# Patient Record
Sex: Female | Born: 1950 | Race: Black or African American | Hispanic: No | Smoking: Former smoker
Health system: Southern US, Community
[De-identification: ages and names within clinical notes are randomized; demographics above are authoritative.]

## PROBLEM LIST (undated history)

## (undated) ENCOUNTER — Emergency Department (HOSPITAL_COMMUNITY): Payer: Medicare Other | Source: Home / Self Care

## (undated) DIAGNOSIS — K529 Noninfective gastroenteritis and colitis, unspecified: Secondary | ICD-10-CM

## (undated) DIAGNOSIS — K219 Gastro-esophageal reflux disease without esophagitis: Secondary | ICD-10-CM

## (undated) DIAGNOSIS — I10 Essential (primary) hypertension: Secondary | ICD-10-CM

## (undated) DIAGNOSIS — I629 Nontraumatic intracranial hemorrhage, unspecified: Secondary | ICD-10-CM

## (undated) DIAGNOSIS — I4892 Unspecified atrial flutter: Secondary | ICD-10-CM

## (undated) DIAGNOSIS — M6281 Muscle weakness (generalized): Secondary | ICD-10-CM

## (undated) DIAGNOSIS — S40021A Contusion of right upper arm, initial encounter: Secondary | ICD-10-CM

## (undated) DIAGNOSIS — N186 End stage renal disease: Secondary | ICD-10-CM

## (undated) DIAGNOSIS — F329 Major depressive disorder, single episode, unspecified: Secondary | ICD-10-CM

## (undated) DIAGNOSIS — F419 Anxiety disorder, unspecified: Secondary | ICD-10-CM

## (undated) DIAGNOSIS — D649 Anemia, unspecified: Secondary | ICD-10-CM

## (undated) DIAGNOSIS — G8929 Other chronic pain: Secondary | ICD-10-CM

## (undated) DIAGNOSIS — G459 Transient cerebral ischemic attack, unspecified: Secondary | ICD-10-CM

## (undated) DIAGNOSIS — R000 Tachycardia, unspecified: Secondary | ICD-10-CM

## (undated) DIAGNOSIS — F32A Depression, unspecified: Secondary | ICD-10-CM

## (undated) DIAGNOSIS — I272 Pulmonary hypertension, unspecified: Secondary | ICD-10-CM

## (undated) DIAGNOSIS — G4700 Insomnia, unspecified: Secondary | ICD-10-CM

## (undated) DIAGNOSIS — Z21 Asymptomatic human immunodeficiency virus [HIV] infection status: Secondary | ICD-10-CM

## (undated) DIAGNOSIS — Z992 Dependence on renal dialysis: Secondary | ICD-10-CM

## (undated) DIAGNOSIS — B20 Human immunodeficiency virus [HIV] disease: Secondary | ICD-10-CM

## (undated) DIAGNOSIS — A0472 Enterocolitis due to Clostridium difficile, not specified as recurrent: Secondary | ICD-10-CM

## (undated) HISTORY — DX: Transient cerebral ischemic attack, unspecified: G45.9

## (undated) HISTORY — PX: ABDOMINAL HYSTERECTOMY: SHX81

## (undated) HISTORY — DX: Tachycardia, unspecified: R00.0

## (undated) HISTORY — DX: Gastro-esophageal reflux disease without esophagitis: K21.9

## (undated) HISTORY — PX: OTHER SURGICAL HISTORY: SHX169

## (undated) HISTORY — PX: TONSILLECTOMY: SUR1361

## (undated) HISTORY — PX: BIOPSY THYROID: PRO38

---

## 1998-10-25 ENCOUNTER — Ambulatory Visit (HOSPITAL_COMMUNITY): Admission: RE | Admit: 1998-10-25 | Discharge: 1998-10-25 | Payer: Self-pay | Admitting: Internal Medicine

## 1999-12-04 ENCOUNTER — Encounter: Payer: Self-pay | Admitting: Emergency Medicine

## 1999-12-04 ENCOUNTER — Emergency Department (HOSPITAL_COMMUNITY): Admission: EM | Admit: 1999-12-04 | Discharge: 1999-12-04 | Payer: Self-pay | Admitting: Emergency Medicine

## 2001-08-16 ENCOUNTER — Other Ambulatory Visit: Admission: RE | Admit: 2001-08-16 | Discharge: 2001-08-16 | Payer: Self-pay | Admitting: Obstetrics

## 2001-09-20 ENCOUNTER — Ambulatory Visit (HOSPITAL_COMMUNITY): Admission: RE | Admit: 2001-09-20 | Discharge: 2001-09-20 | Payer: Self-pay | Admitting: Obstetrics

## 2001-09-20 ENCOUNTER — Encounter: Payer: Self-pay | Admitting: Obstetrics

## 2001-09-22 ENCOUNTER — Encounter (INDEPENDENT_AMBULATORY_CARE_PROVIDER_SITE_OTHER): Payer: Self-pay | Admitting: *Deleted

## 2001-09-22 ENCOUNTER — Encounter: Payer: Self-pay | Admitting: Obstetrics

## 2001-09-22 ENCOUNTER — Encounter: Admission: RE | Admit: 2001-09-22 | Discharge: 2001-09-22 | Payer: Self-pay | Admitting: Obstetrics

## 2002-05-26 ENCOUNTER — Emergency Department (HOSPITAL_COMMUNITY): Admission: EM | Admit: 2002-05-26 | Discharge: 2002-05-27 | Payer: Self-pay | Admitting: *Deleted

## 2002-05-27 ENCOUNTER — Encounter: Payer: Self-pay | Admitting: *Deleted

## 2002-08-24 ENCOUNTER — Encounter: Payer: Self-pay | Admitting: General Surgery

## 2002-08-24 ENCOUNTER — Ambulatory Visit (HOSPITAL_COMMUNITY): Admission: RE | Admit: 2002-08-24 | Discharge: 2002-08-24 | Payer: Self-pay | Admitting: General Surgery

## 2002-08-24 IMAGING — CT CT NECK W/ CM
2 of 4 series · 5 of 14 positions shown, 6 images · IV contrast (agent unspecified)
Comparison: none

FINDINGS
CLINICAL DATA: DIFFUSE LYMPHADENOPATHY ON PHYSICAL EXAM.
CT NECK WITH CONTRAST
CONTIGUOUS AXIAL CT IMAGES WERE OBTAINED FROM THE SKULL BASE THROUGH THE PUBIC SYMPHYSIS AFTER
SPIRAL CT SCANNING.  ORAL AND 150 CC OF NONIONIC INTRAVENOUS CONTRAST WAS ADMINISTERED PRIOR TO
SCANNING.
THERE IS BILATERAL SUBMANDIBULAR AND CERVICAL LYMPHADENOPATHY WITH 2.2 X 1.1 CM INDEX LEFT
SUBMANDIBULAR NODE.  A 2.0 X 1.0 CM LEFT JUGULAR DIGASTRIC NODE IS ASSOCIATED WITH A 2.2 X 1.4 CM
POSTERIOR TRIANGLE NODE OF THE LEFT LOWER NECK.  THERE ARE SHOTTY SUPRACLAVICULAR LYMPH NODES
BILATERALLY.
DEGENERATIVE CHANGES CHARACTERIZE THE UPPER CERVICAL SPINE WITHOUT EVIDENCE FOR FOCAL SCLEROTIC OR
LYTIC OSSEOUS LESIONS.
IMPRESSION
BILATERAL SUBMANDIBULAR AND CERVICAL LYMPHADENOPATHY WITH ABNORMAL LYMPH NODES IDENTIFIED IN BOTH
SUPRACLAVICULAR REGIONS.
CT CHEST WITH CONTRAST
BILATERAL AXILLARY LYMPHADENOPATHY IS APPARENT.  THERE IS A PROMINENT RIGHT AXILLARY NODE WHICH
MEASURES 3.7 X APPROXIMATELY 2.0 CM ALTHOUGH THE ENTIRE NODE HAS NOT BEEN INCLUDED WITHIN THE FIELD
OF VIEW.  SHOTTY LYMPH NODES ARE SEEN IN THE SUPERIOR MEDIASTINUM AND THERE IS A 12 MM SHORT AXIS
LYMPH NODE IDENTIFIED IN THE AP WINDOW.  10 MM SHORT AXIS SUBCARINAL NODE IS ASSOCIATED WITH
LYMPHOID TISSUE IN BOTH HILAR REGIONS BUT NO FRANK HILAR LYMPHADENOPATHY.  THE HEART IS AT UPPER
LIMITS OF NORMAL FOR SIZE WITHOUT EVIDENCE OF PERICARDIAL EFFUSION.  THERE IS NO EVIDENCE FOR
PLEURAL FLUID COLLECTION.
A 1 CM RIGHT LATERAL THORACIC NODE IS IDENTIFIED IN THE ANTERIOR AXILLARY LINE.
LUNG WINDOWS REVEAL NO AREAS OF FOCAL CONSOLIDATION.  NO PARENCHYMAL NODULES OR MASSES ARE
IDENTIFIED.
SHOTTY LYMPH NODES ARE IDENTIFIED IN THE SUPERIOR MEDIASTINUM WITH BORDERLINE ENLARGED AP WINDOW
AND SUBCARINAL ADENOPATHY.  PROMINENT AXILLARY NODES ARE SEEN BILATERALLY.
CT ABDOMEN WITH CONTRAST
THE LIVER, SPLEEN, STOMACH, DUODENUM, PANCREAS, ADRENAL GLANDS, AND LEFT KIDNEY HAVE NORMAL IMAGING
FEATURES.  THERE ARE SEVERAL TINY LOW DENSITY LESIONS IN THE RIGHT KIDNEY, WHICH ARE TOO SMALL TO
CHARACTERIZE BUT PROBABLY REPRESENT CYSTS.  PROMINENT MUCOSAL ENHANCEMENT  IS NOTED IN THE
GALLBLADDER WITH QUESTION OF MILD GALLBLADDER WALL THICKENING.
NO LYMPHADENOPATHY IS IDENTIFIED IN THE ABDOMEN.
1.  NO EVIDENCE FOR ABDOMINAL LYMPHADENOPATHY.
2.  QUERY MILD GALLBLADDER WALL THICKENING.
3.  SMALL LOW DENSITY RIGHT RENAL LESIONS ARE TOO SMALL TO CHARACTERIZE.
CT PELVIS W/CONTRAST
SCANNING THROUGH THE ANATOMIC PELVIS REVEALS A 1.9 X 1.1 CM LEFT COMMON ILIAC LYMPH NODE (IMAGE
#46).  LESS PROMINENT RIGHT COMMON ILIAC ADENOPATHY IS APPARENT WITH SMALL LYMPH NODES SEEN ALONG
THE RIGHT INTERNAL ILIAC CHAIN AND BILATERAL COMMON FEMORAL AND INGUINAL LYMPHADENOPATHY.
NO FREE FLUID IS IDENTIFIED IN THE ANATOMIC PELVIS.  THE PATIENT IS STATUS POST HYSTERECTOMY.  NO
EVIDENCE FOR ADNEXAL MASSES.
BILATERAL PELVIC SIDE WALL LYMPHADENOPATHY.  GIVEN THE WIDE DISTRIBUTION OF INVOLVED NODES
INVOLVING THE NECK, UPPER THORAX, MEDIASTINUM, AND ANATOMIC PELVIS, DIFFUSE INFECTIOUS/INFLAMMATORY
PROCESS OR NEOPLASM ARE THE MOST LIKELY CONSIDERATIONS.  FEATURES WOULD BE COMPATIBLE WITH LYMPHOMA.

[Series 5499: — · axial · 0.54mm/px · z∈[-941,-861]mm · 2 of 50 slices shown (1 of 2)]
[im 17/50  bone]
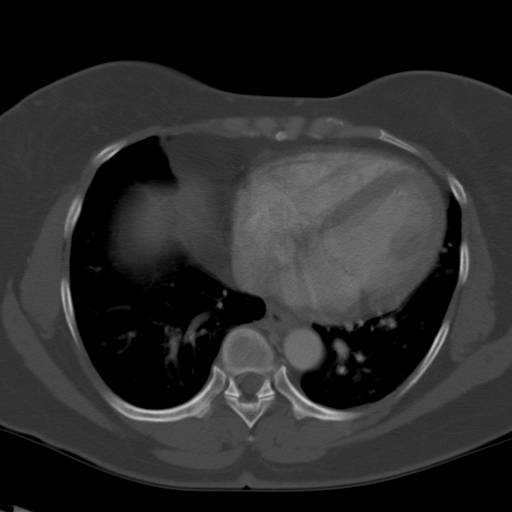
[im 33/50  bone]
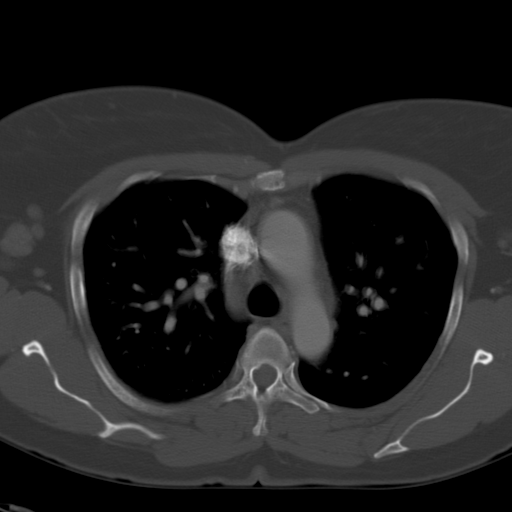

[Series 5500: — · axial · 0.63mm/px · z∈[-1248,-1038]mm · 3 of 84 slices shown, 4 images (2 of 2)]
[im 21/84  soft-tissue]
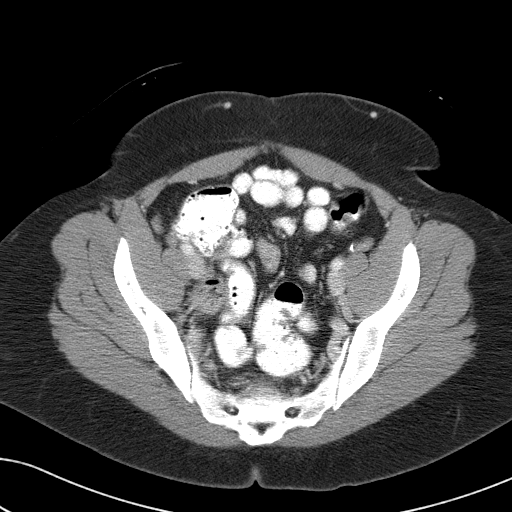
[im 21/84  bone]
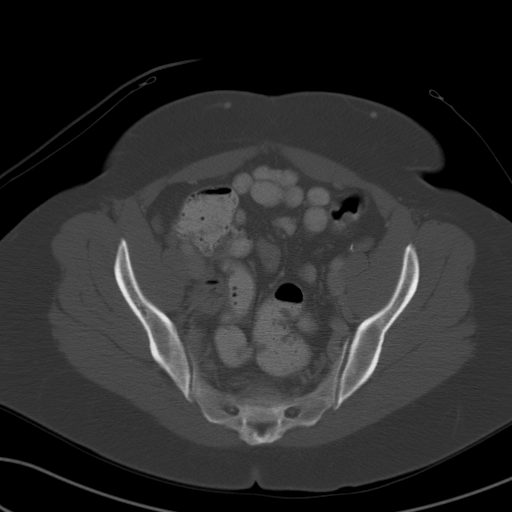
[im 42/84  bone]
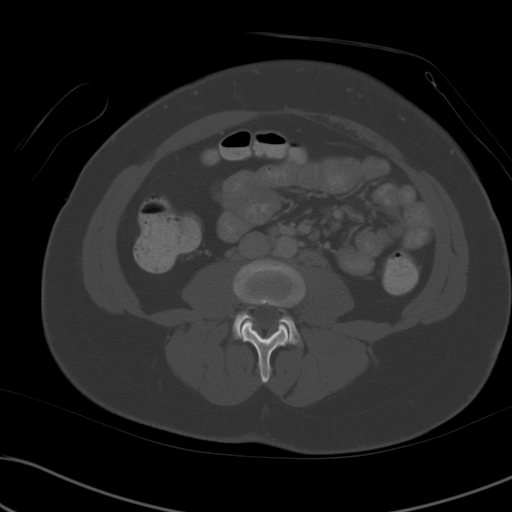
[im 63/84  bone]
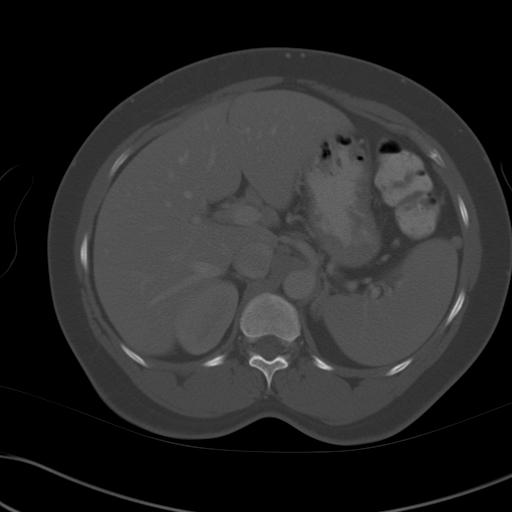

[5 of 14 positions shown; findings below may reference images not displayed]

## 2002-09-20 ENCOUNTER — Inpatient Hospital Stay (HOSPITAL_COMMUNITY): Admission: RE | Admit: 2002-09-20 | Discharge: 2002-09-29 | Payer: Self-pay | Admitting: General Surgery

## 2002-09-20 ENCOUNTER — Encounter: Payer: Self-pay | Admitting: Family Medicine

## 2002-09-28 ENCOUNTER — Encounter: Payer: Self-pay | Admitting: Family Medicine

## 2002-10-25 ENCOUNTER — Encounter: Admission: RE | Admit: 2002-10-25 | Discharge: 2002-10-25 | Payer: Self-pay | Admitting: Oncology

## 2002-10-25 ENCOUNTER — Encounter (HOSPITAL_COMMUNITY): Admission: RE | Admit: 2002-10-25 | Discharge: 2002-11-24 | Payer: Self-pay | Admitting: Oncology

## 2002-11-27 ENCOUNTER — Encounter (INDEPENDENT_AMBULATORY_CARE_PROVIDER_SITE_OTHER): Payer: Self-pay | Admitting: *Deleted

## 2002-11-27 ENCOUNTER — Encounter: Payer: Self-pay | Admitting: Internal Medicine

## 2002-11-27 ENCOUNTER — Ambulatory Visit (HOSPITAL_COMMUNITY): Admission: RE | Admit: 2002-11-27 | Discharge: 2002-11-27 | Payer: Self-pay | Admitting: Internal Medicine

## 2002-11-27 ENCOUNTER — Encounter: Admission: RE | Admit: 2002-11-27 | Discharge: 2002-11-27 | Payer: Self-pay | Admitting: Internal Medicine

## 2003-01-16 ENCOUNTER — Encounter: Admission: RE | Admit: 2003-01-16 | Discharge: 2003-01-16 | Payer: Self-pay | Admitting: Internal Medicine

## 2003-03-20 ENCOUNTER — Encounter: Admission: RE | Admit: 2003-03-20 | Discharge: 2003-03-20 | Payer: Self-pay | Admitting: Internal Medicine

## 2003-05-18 ENCOUNTER — Ambulatory Visit (HOSPITAL_COMMUNITY): Admission: RE | Admit: 2003-05-18 | Discharge: 2003-05-18 | Payer: Self-pay | Admitting: Family Medicine

## 2003-05-18 IMAGING — CR DG CHEST 2V
2 series · 2 of 2 positions shown · non-contrast
Comparison: One view chest [DATE].

CLINICAL DATA: Cough, hemoptysis.
 CHEST TWO VIEWS  [DATE] at [UZ] HOURS

[view not recorded (1 of 2)]
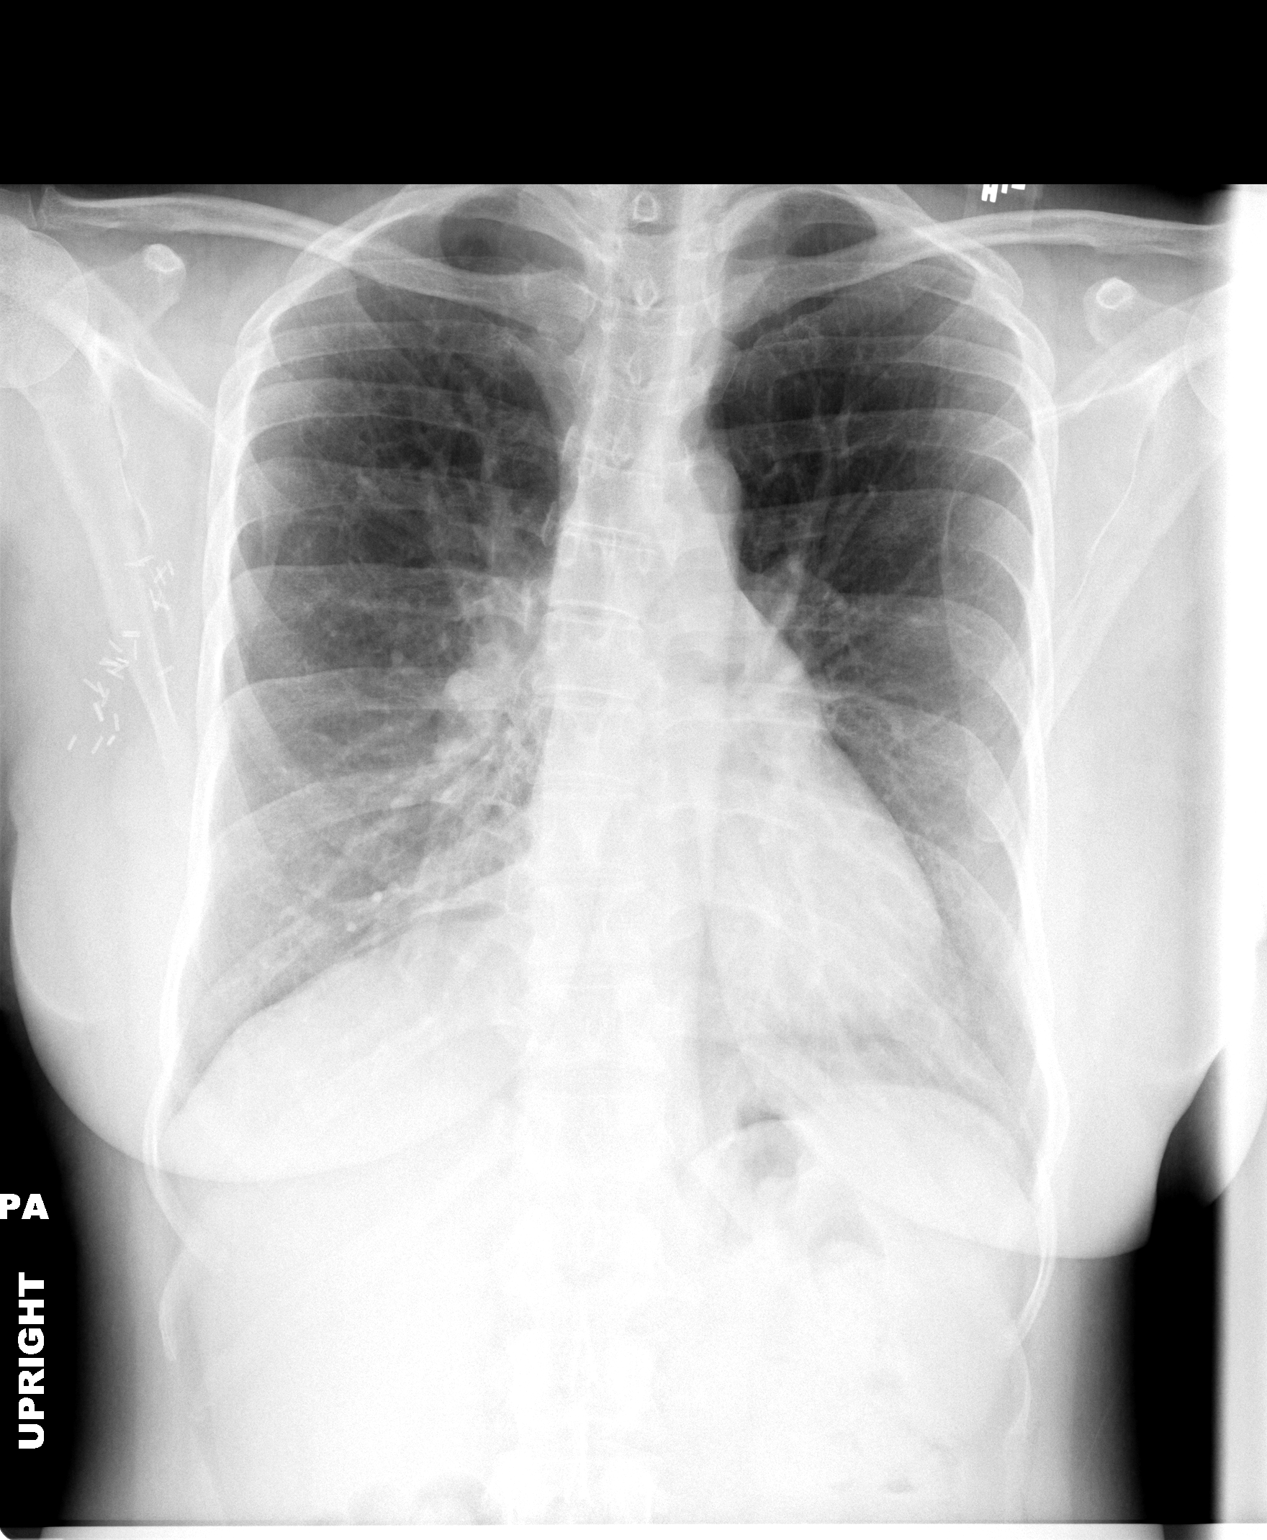

[view not recorded (2 of 2)]
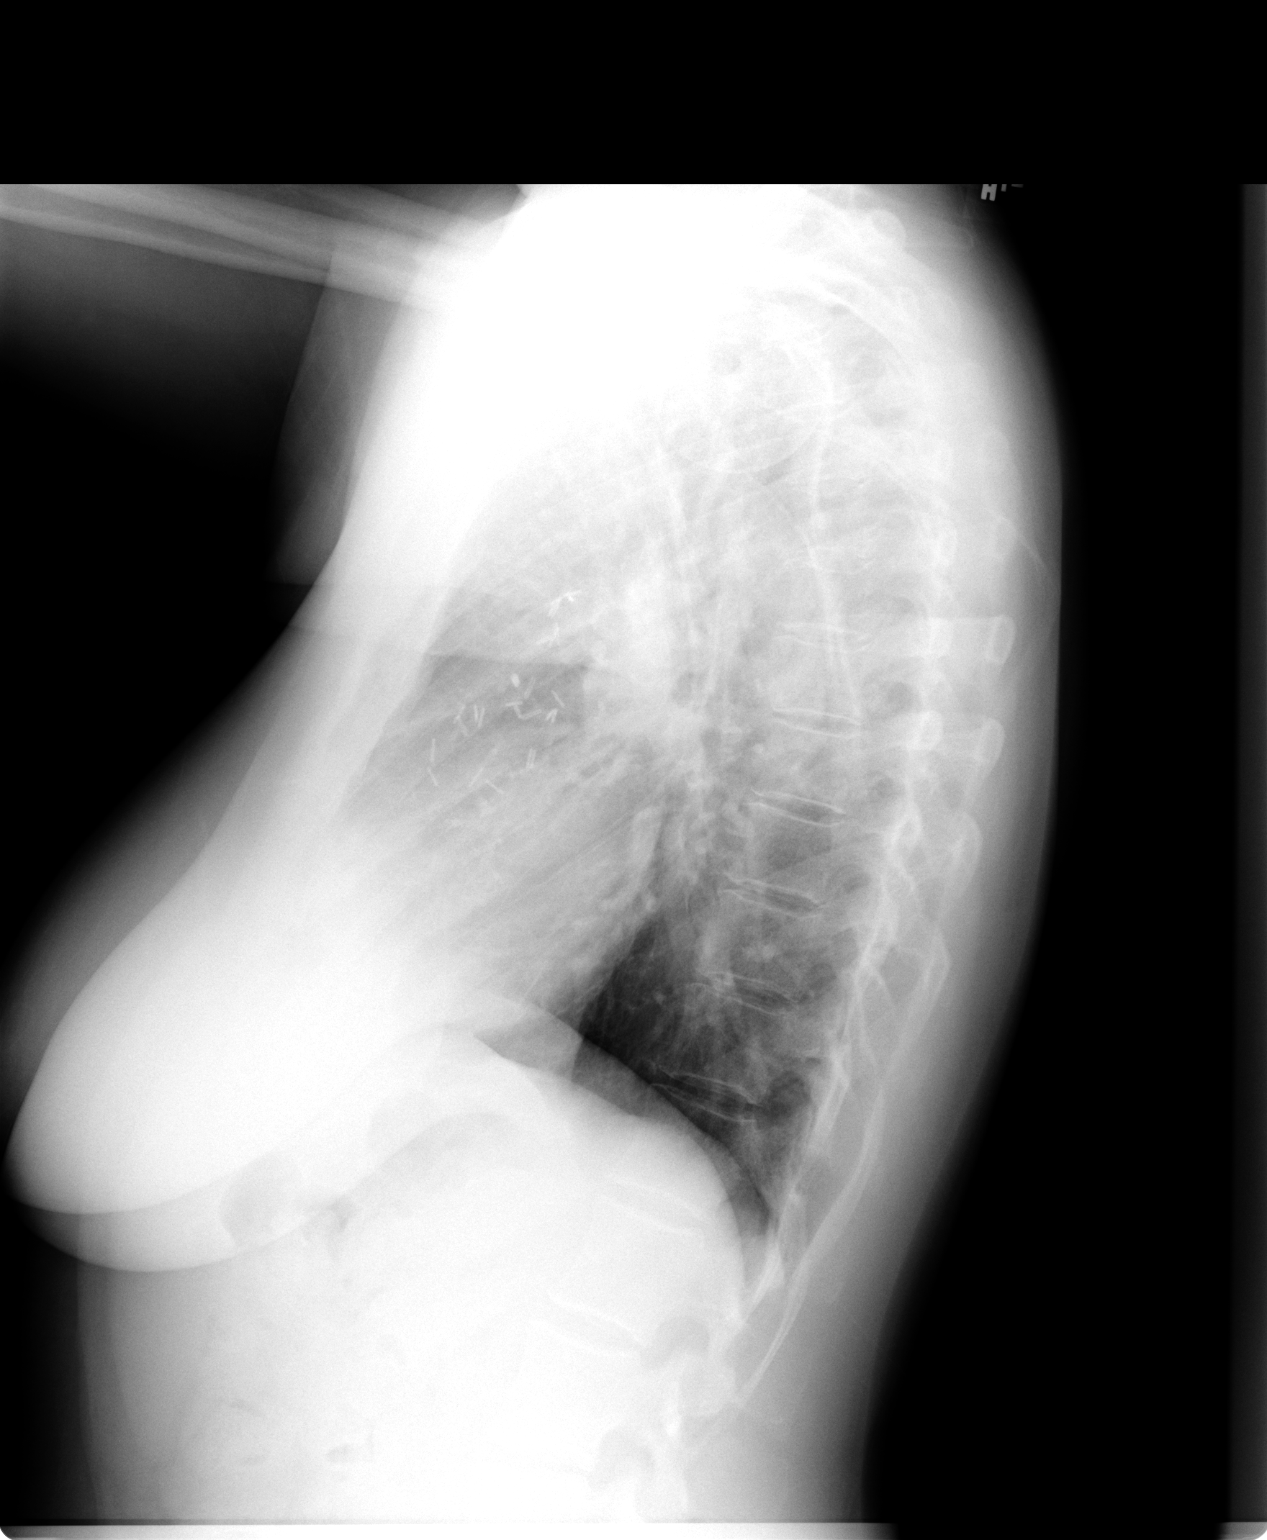

[2 of 2 positions shown; findings below may reference images not displayed]

Heart size is normal and stable.  Minimal thoracic aortic atherosclerosis is unchanged.  The hilar and mediastinal contours are otherwise unremarkable.  The lungs appear clear.  Marked central peribronchial thickening is present.  Note is again made of surgical clips in the right axilla from prior node dissection.  Mild degenerative changes are present in the thoracic spine.  
 IMPRESSION
 Moderate to severe changes of asthma/bronchitis without localized consolidation.

## 2003-05-28 ENCOUNTER — Inpatient Hospital Stay (HOSPITAL_COMMUNITY): Admission: AD | Admit: 2003-05-28 | Discharge: 2003-06-04 | Payer: Self-pay | Admitting: General Surgery

## 2003-05-28 IMAGING — US US ABDOMEN COMPLETE
1 series · 14 of 25 positions shown · non-contrast
Comparison: none

CLINICAL DATA: Acute abdominal pain; pancreatitis; HIV positive
 ABDOMINAL ULTRASOUND
 Multiple real time sector scans are performed.  
 The liver has a normal appearance without evidence of focal lesions or biliary ductal dilatation.  The gallbladder contains sludge.  There are no identifiable stones.  No gallbladder wall thickening.  The common duct measures 4 mm proximally and 6 mm distally.  I do not see any stone in the common duct.  The spleen appears normal.  The pancreas appears normal.  The aorta and inferior vena cava appear normal.  There is no aneurysm.  Both kidneys are echogenic, the right measuring 11.0 cm in length and the left also measuring 11.0 cm in length.  There is no hydronephrosis, cyst, or mass.  There is no ascites. 
 IMPRESSION
 1.  Gallbladder sludge without evidence of cholecystitis or obstruction. 
 2.  Echogenic kidneys without evidence of focal lesion or obstruction.

[Series 1: unknown · 0.33mm/px · 14 of 64 slices shown]
[im 1/64]
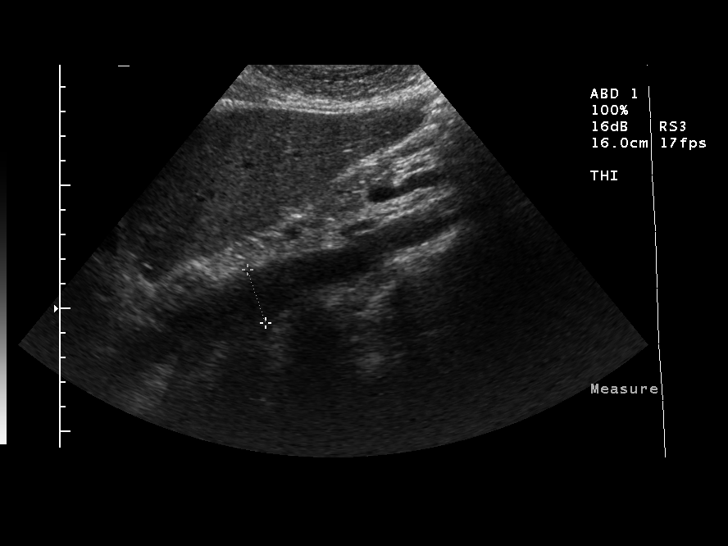
[im 6/64]
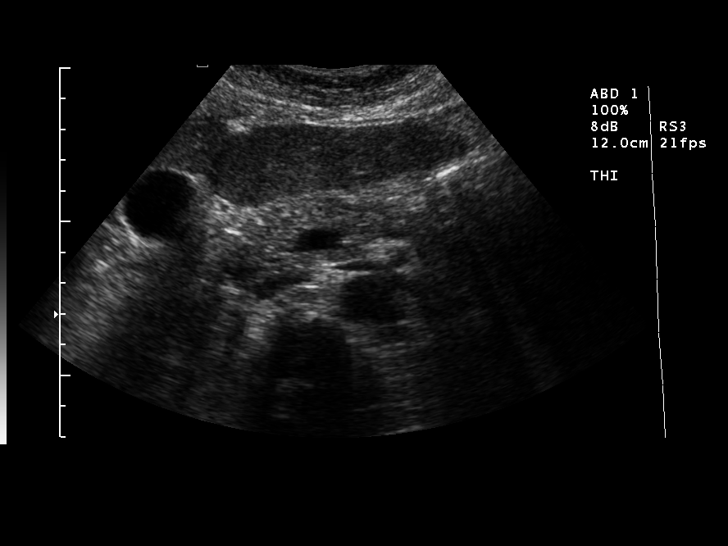
[im 11/64]
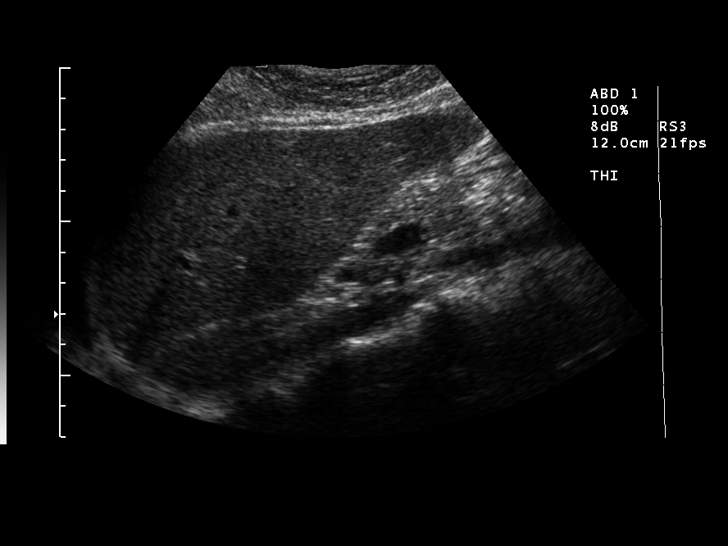
[im 16/64]
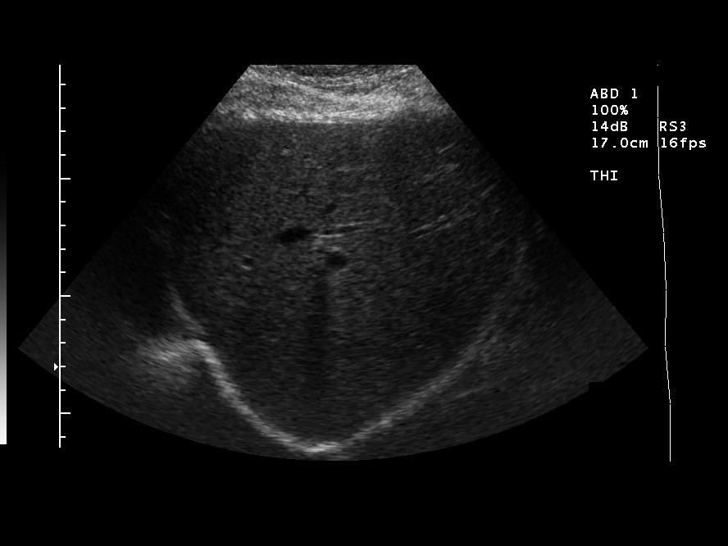
[im 22/64]
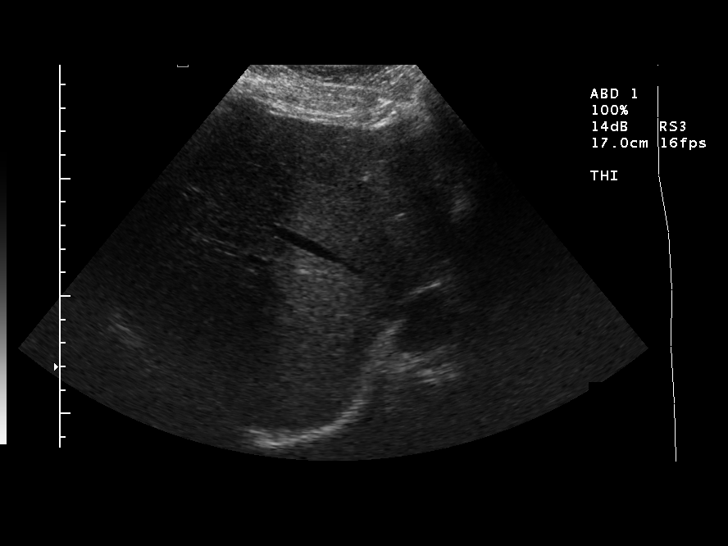
[im 24/64]
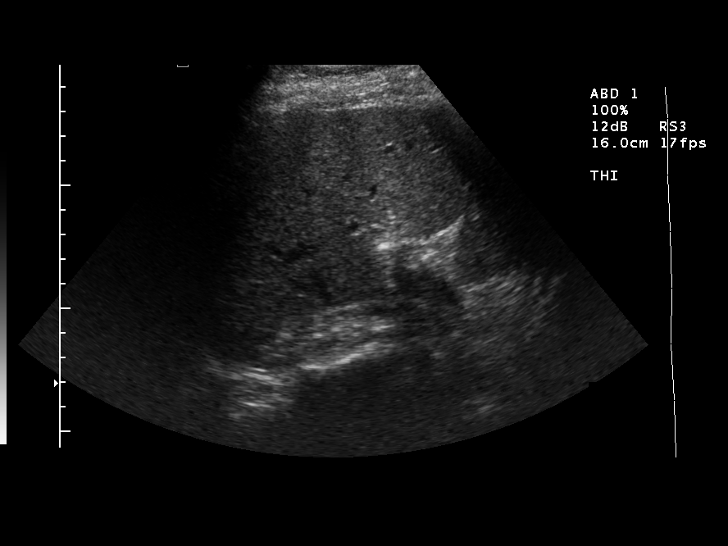
[im 29/64]
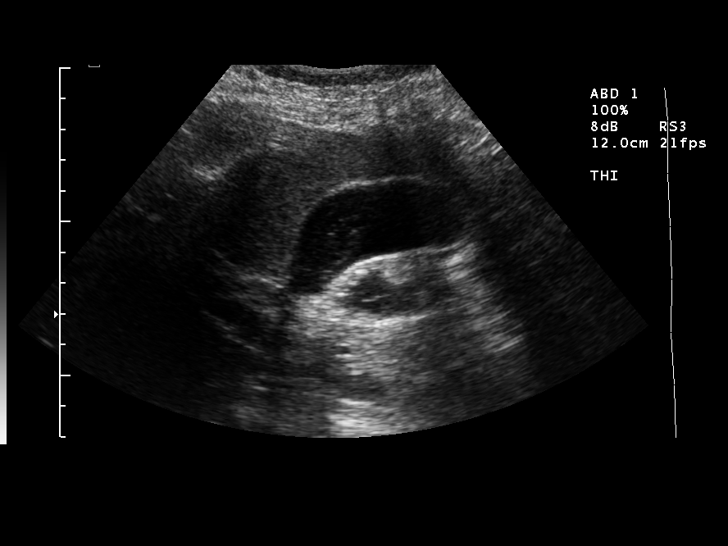
[im 35/64]
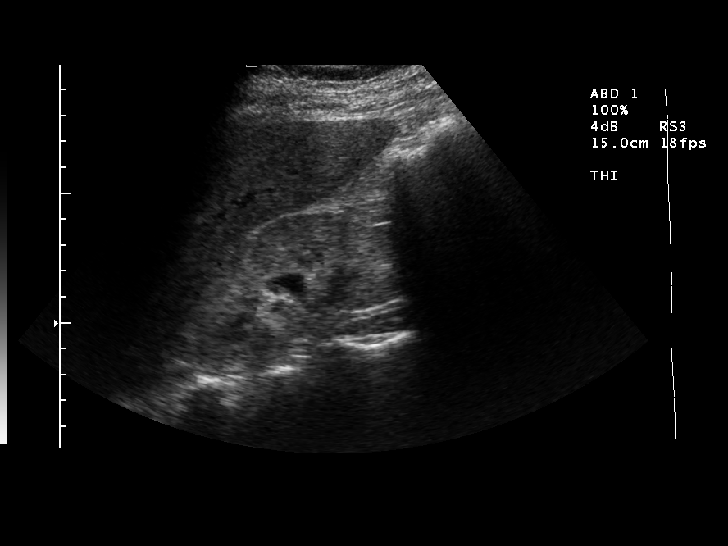
[im 40/64]
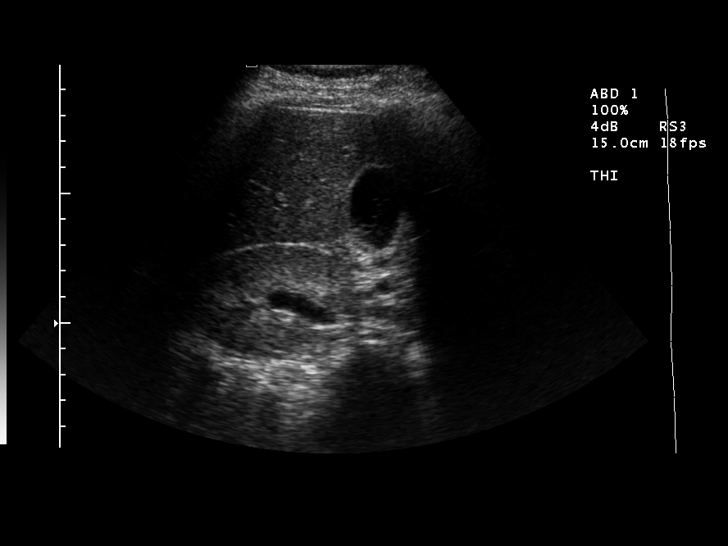
[im 43/64]
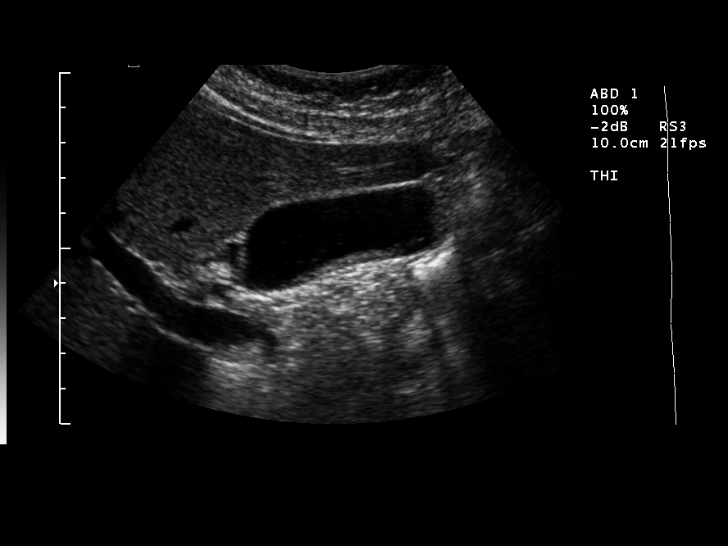
[im 48/64]
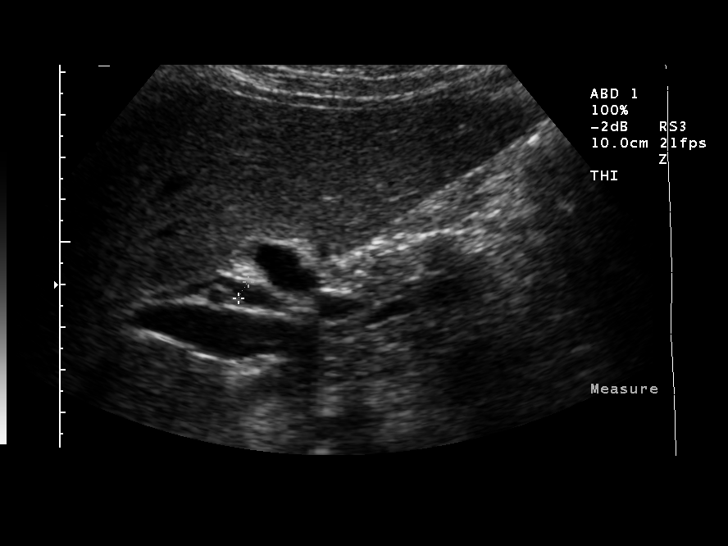
[im 53/64]
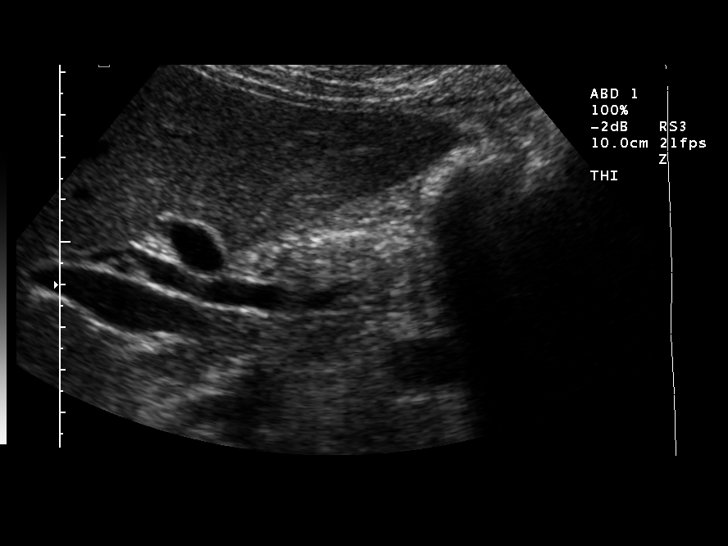
[im 58/64]
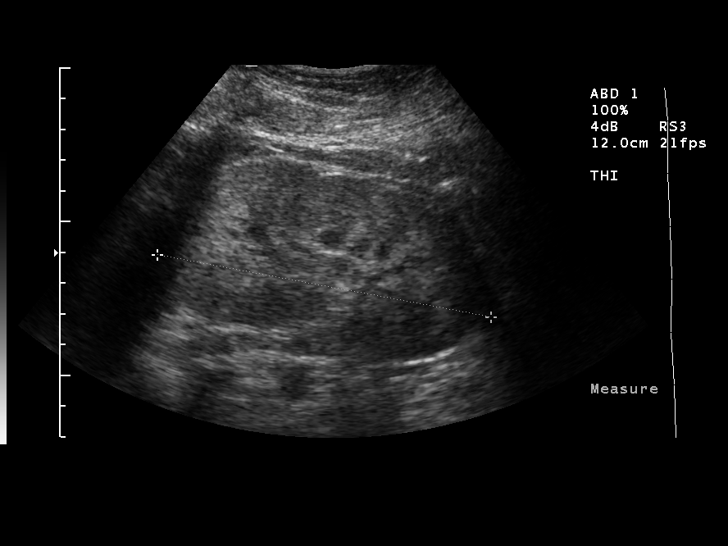
[im 64/64]
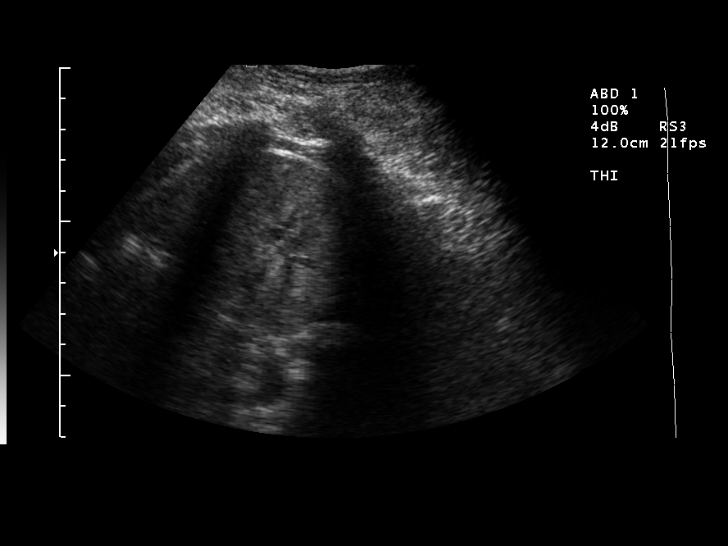

[14 of 25 positions shown; findings below may reference images not displayed]

## 2003-06-19 ENCOUNTER — Encounter: Admission: RE | Admit: 2003-06-19 | Discharge: 2003-06-19 | Payer: Self-pay | Admitting: Internal Medicine

## 2003-08-21 ENCOUNTER — Encounter: Admission: RE | Admit: 2003-08-21 | Discharge: 2003-08-21 | Payer: Self-pay | Admitting: Internal Medicine

## 2003-08-21 ENCOUNTER — Ambulatory Visit (HOSPITAL_COMMUNITY): Admission: RE | Admit: 2003-08-21 | Discharge: 2003-08-21 | Payer: Self-pay | Admitting: Internal Medicine

## 2003-09-04 ENCOUNTER — Encounter: Admission: RE | Admit: 2003-09-04 | Discharge: 2003-09-04 | Payer: Self-pay | Admitting: Internal Medicine

## 2004-01-03 ENCOUNTER — Emergency Department (HOSPITAL_COMMUNITY): Admission: EM | Admit: 2004-01-03 | Discharge: 2004-01-03 | Payer: Self-pay | Admitting: Emergency Medicine

## 2004-04-01 ENCOUNTER — Ambulatory Visit: Payer: Self-pay | Admitting: Internal Medicine

## 2004-04-01 ENCOUNTER — Ambulatory Visit (HOSPITAL_COMMUNITY): Admission: RE | Admit: 2004-04-01 | Discharge: 2004-04-01 | Payer: Self-pay | Admitting: Internal Medicine

## 2004-04-01 ENCOUNTER — Encounter (INDEPENDENT_AMBULATORY_CARE_PROVIDER_SITE_OTHER): Payer: Self-pay | Admitting: *Deleted

## 2004-06-26 ENCOUNTER — Emergency Department (HOSPITAL_COMMUNITY): Admission: EM | Admit: 2004-06-26 | Discharge: 2004-06-26 | Payer: Self-pay | Admitting: Emergency Medicine

## 2004-07-21 ENCOUNTER — Inpatient Hospital Stay (HOSPITAL_COMMUNITY): Admission: EM | Admit: 2004-07-21 | Discharge: 2004-07-29 | Payer: Self-pay | Admitting: Emergency Medicine

## 2004-07-22 IMAGING — US US ABDOMEN COMPLETE
1 series · 14 of 25 positions shown · non-contrast
Comparison: none

HISTORY: Acute renal failure, pancreatitis

[Series 1: unknown · 0.34mm/px · 14 of 103 slices shown]
[im 1/103]
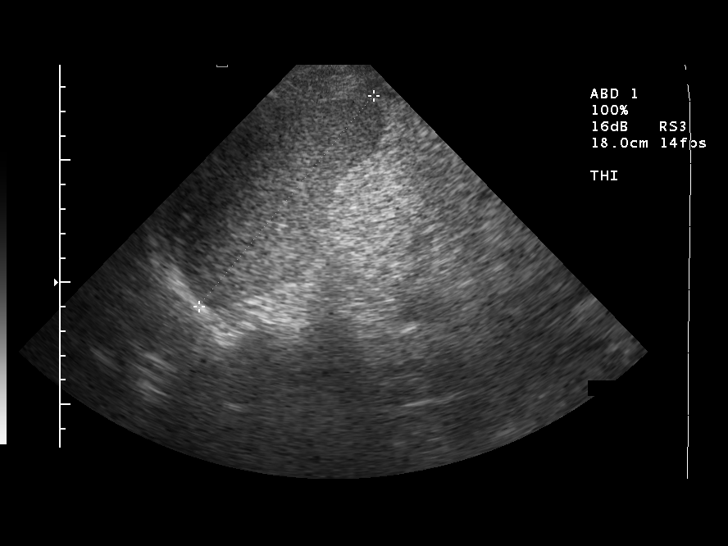
[im 9/103]
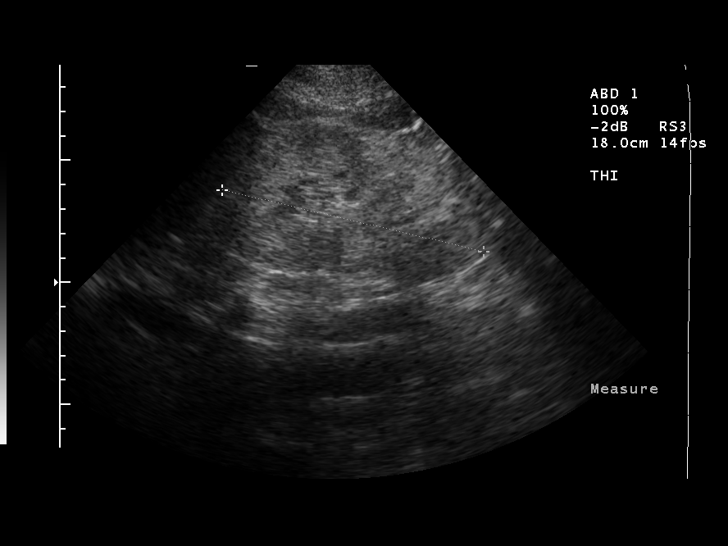
[im 18/103]
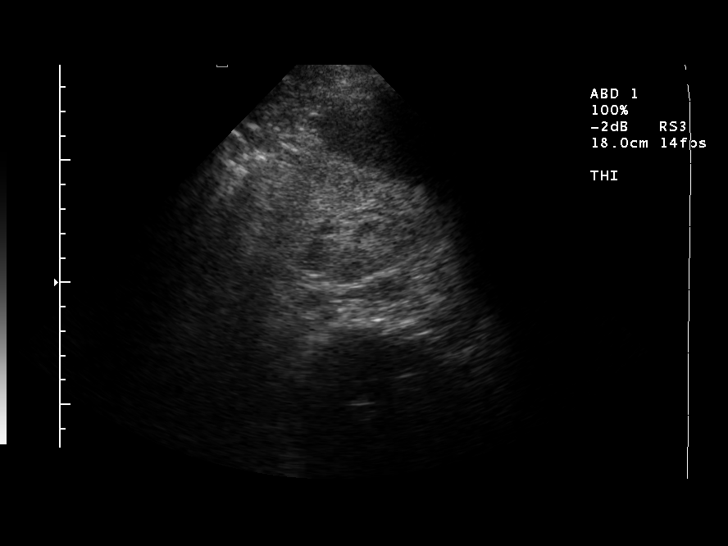
[im 26/103]
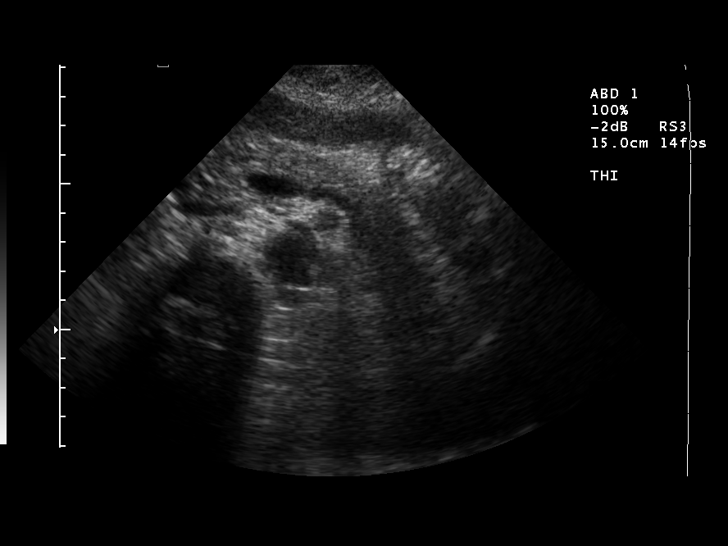
[im 35/103]
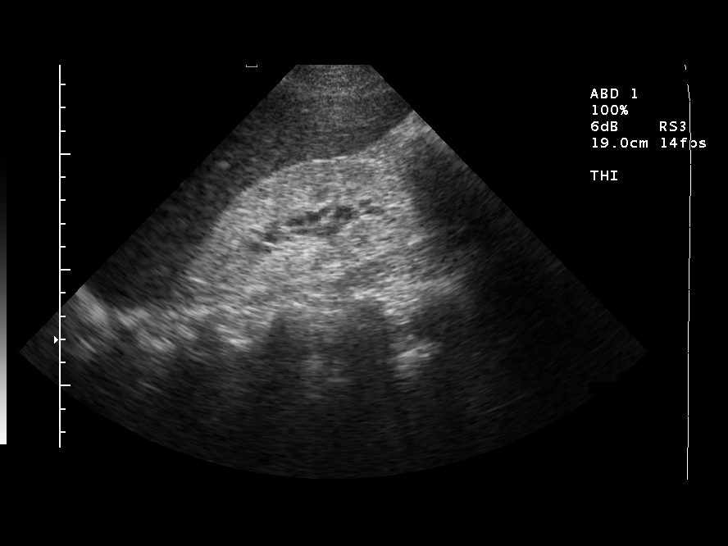
[im 39/103]
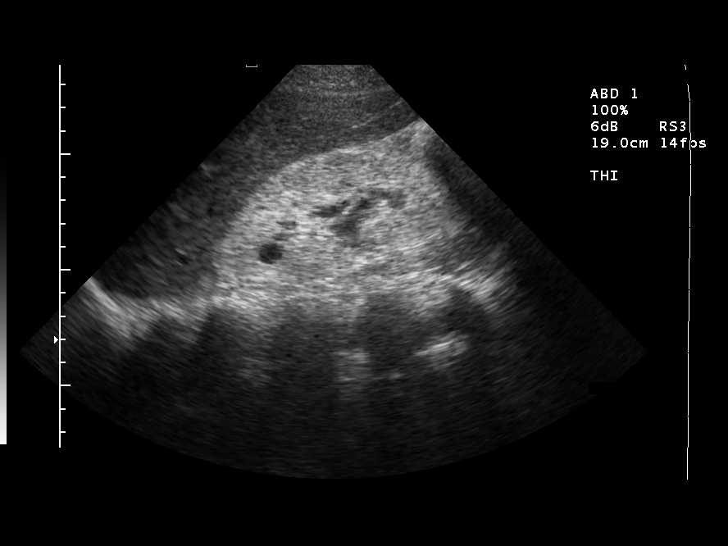
[im 47/103]
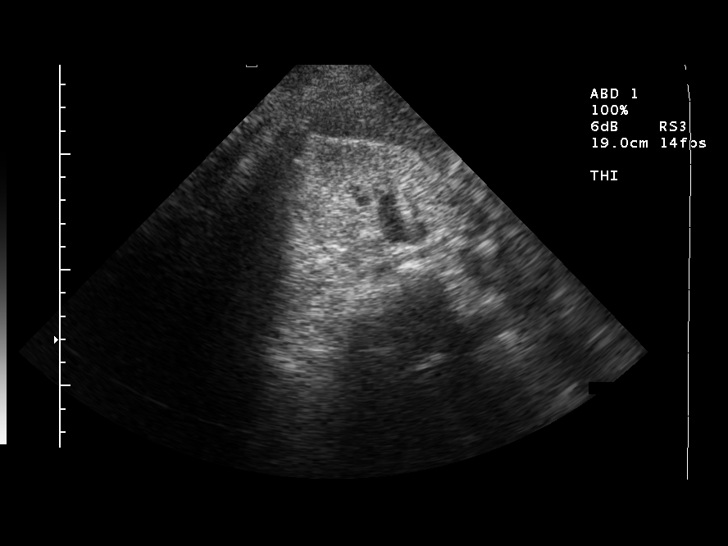
[im 56/103]
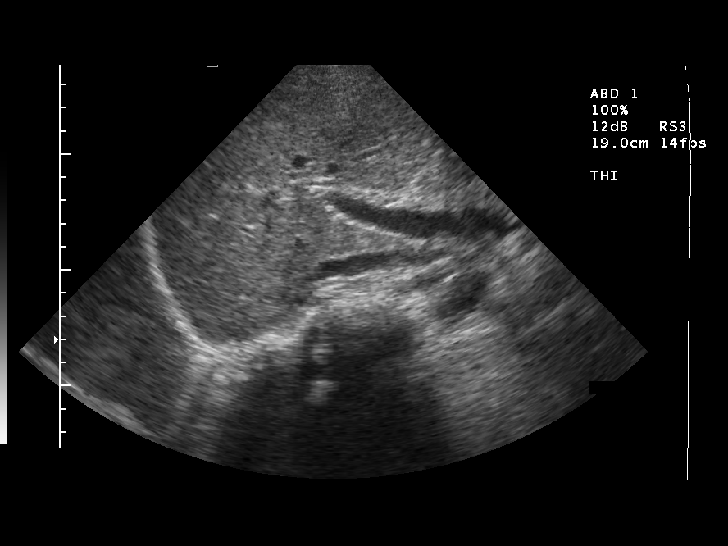
[im 64/103]
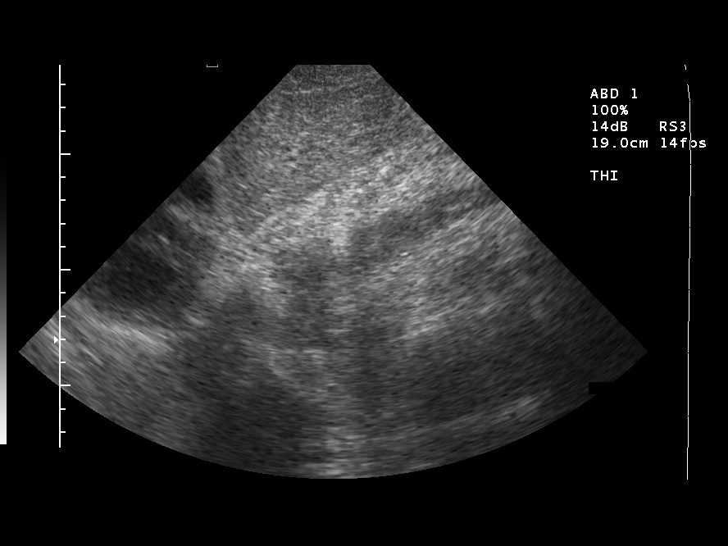
[im 69/103]
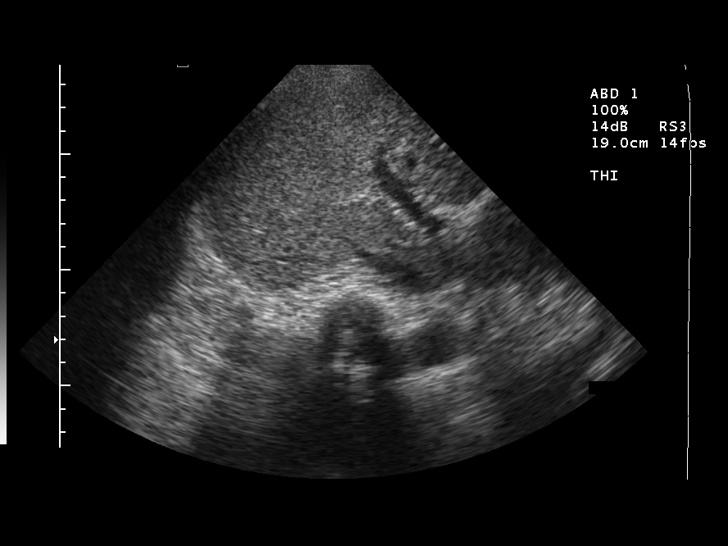
[im 77/103]
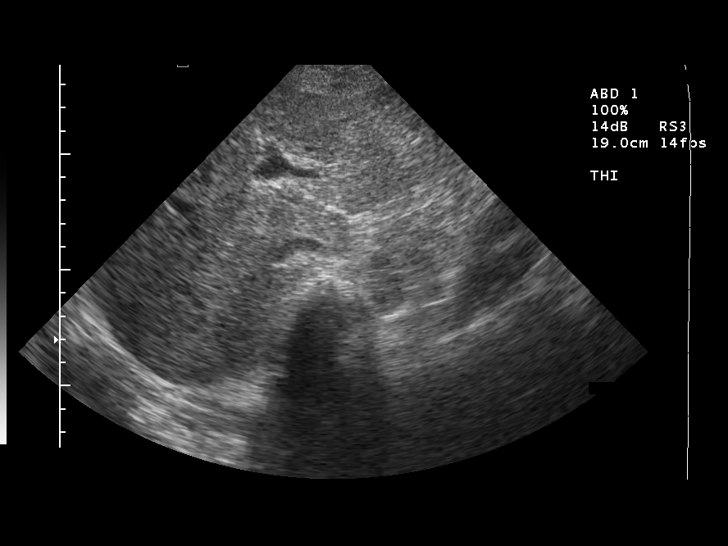
[im 86/103]
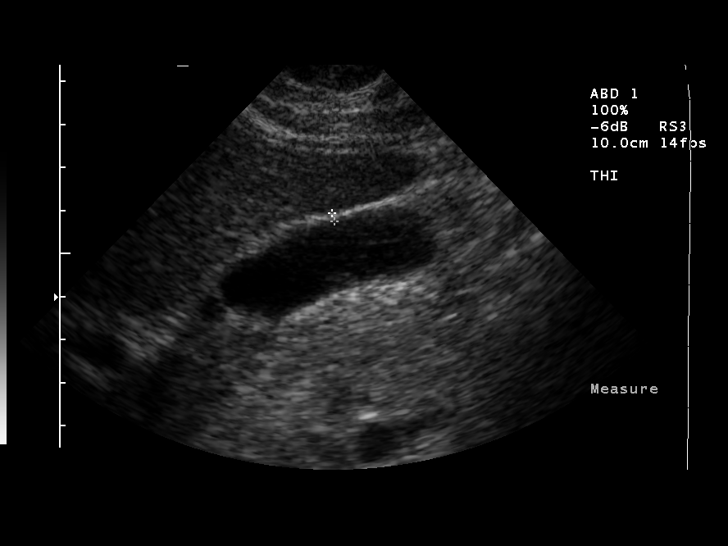
[im 94/103]
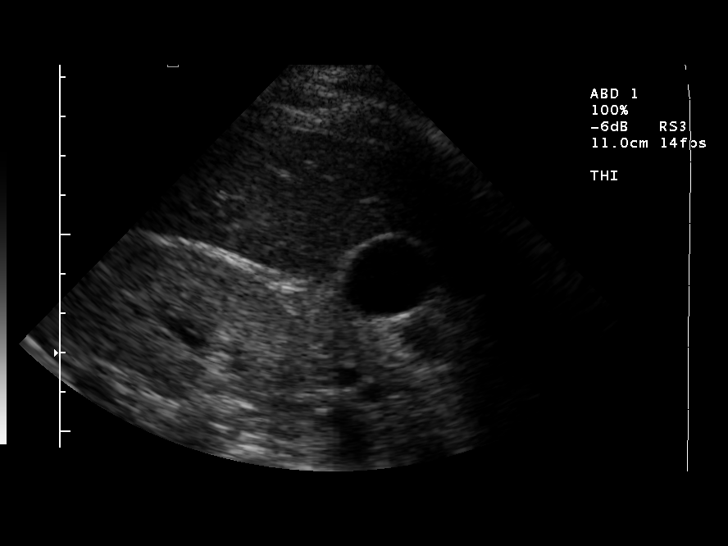
[im 103/103]
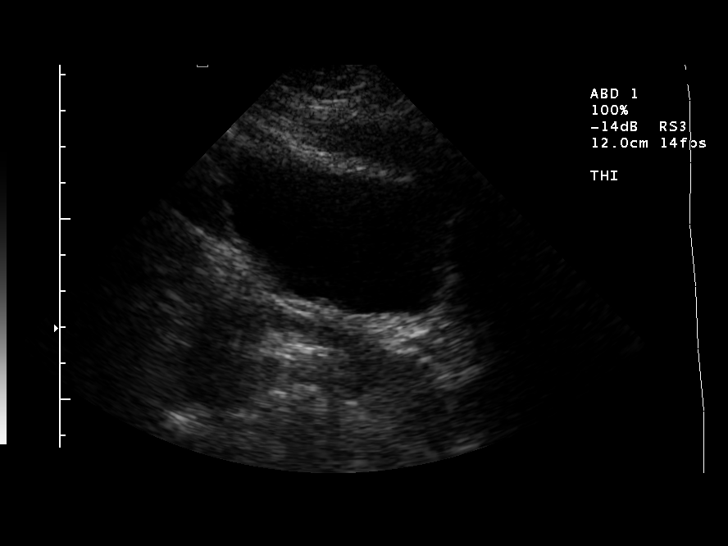

[14 of 25 positions shown; findings below may reference images not displayed]

ULTRASOUND ABDOMEN COMPLETE:

Gallbladder normal without stones or wall thickening.
Common bile duct normal caliber 5 mm diameter.
Liver, pancreas, and spleen unremarkable.
No pancreatic calcification, mass, or peripancreatic fluid seen.
Aorta and IVC normal appearance.

Kidneys normal size, 11.8 cm length right and 10.5 cm length left.
Markedly increased renal cortical echogenicity bilaterally.
Very mild hydronephrosis noted right kidney.
Tiny cyst upper pole right kidney, 9 mm diameter.
No perinephric fluid or gross bladder abnormality.
IMPRESSION: Medical renal disease changes with very mild right hydronephrosis.
No sonographic evidence of pancreatitis.
Tiny right renal cyst.

## 2004-07-28 IMAGING — CR DG BONE SURVEY MET
8 of 10 series · 8 of 10 positions shown · non-contrast
Comparison: none

CLINICAL DATA: Acute renal failure.   Pancreatitis.  Question multiple myeloma.   
 DIAGNOSTIC BONE SURVEY FOR METASTATIC DISEASE:
 No comparison.
 No lucency?s identified in the skull.   The patient is edentulous.  Two view cervical spine shows no focal lytic nor sclerotic osseous lesions.   Degenerative facet disease is seen at the mid cervical levels.  
 Images of the shoulders reveal no focal lytic or sclerotic osseous lesions.   Multiple surgical clips are seen in the right axilla.  No focal bony abnormality is seen in either humerus.   Two exam of the thoracic spine is unremarkable and no focal lytic or sclerotic osseous lesions are identified in the lumbar spine.   Bony anatomic pelvis has normal features.  There is no focal bony abnormality in either femur.   Each tibia and fibula has normal features.

[view not recorded (1 of 8)]
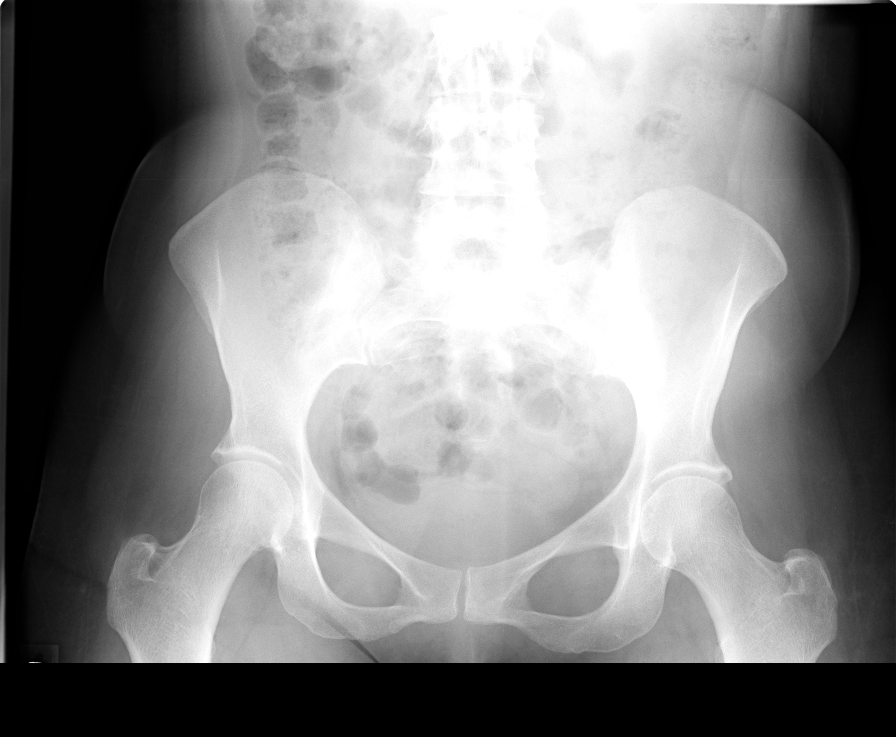

[view not recorded (2 of 8)]
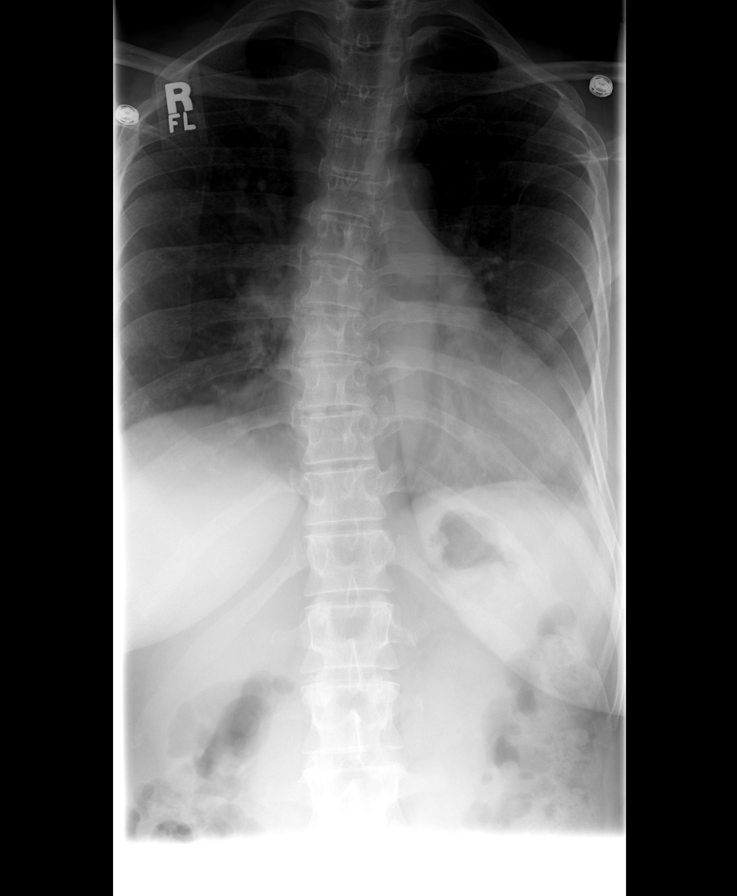

[view not recorded (3 of 8)]
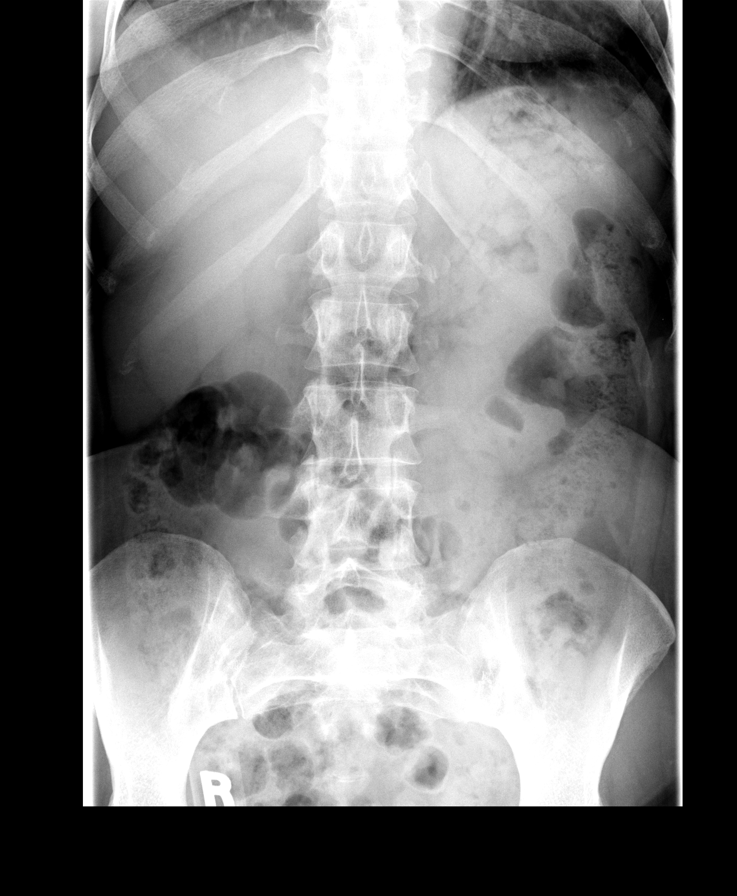

[view not recorded (4 of 8)]
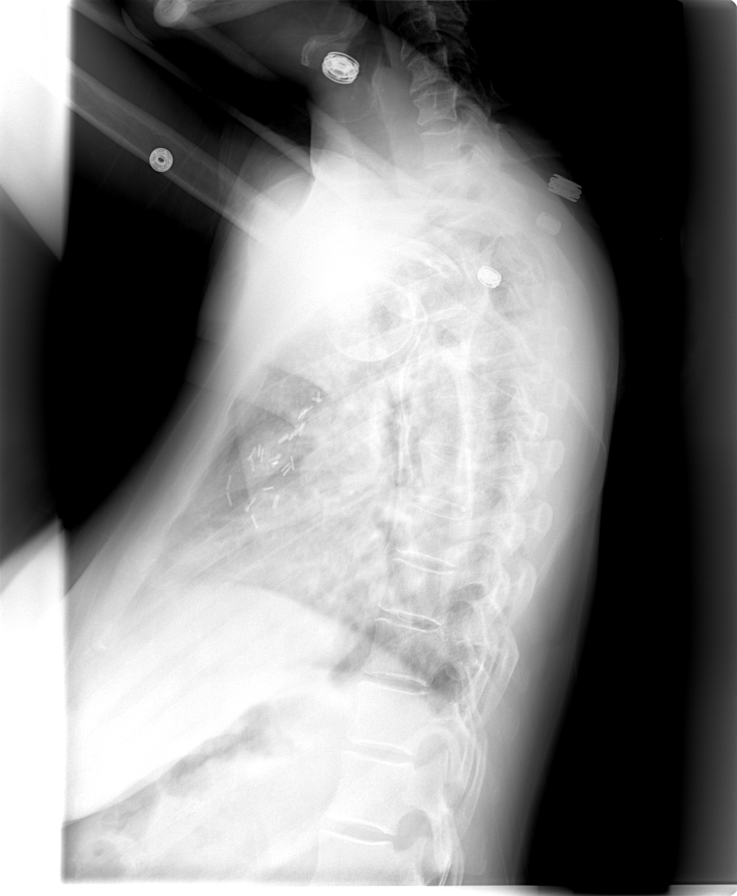

[view not recorded (5 of 8)]
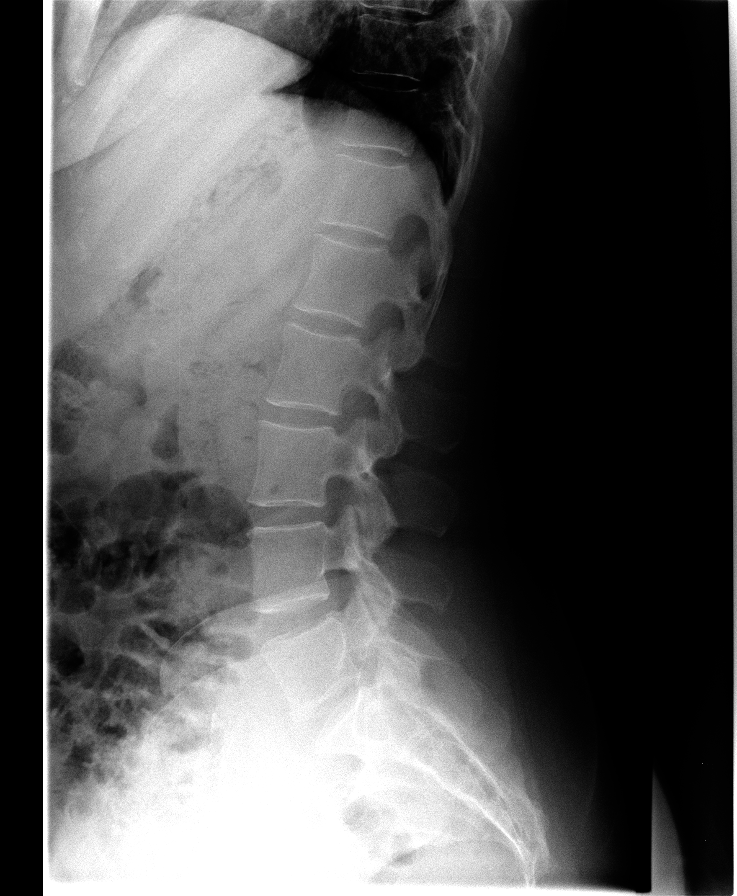

[view not recorded (6 of 8)]
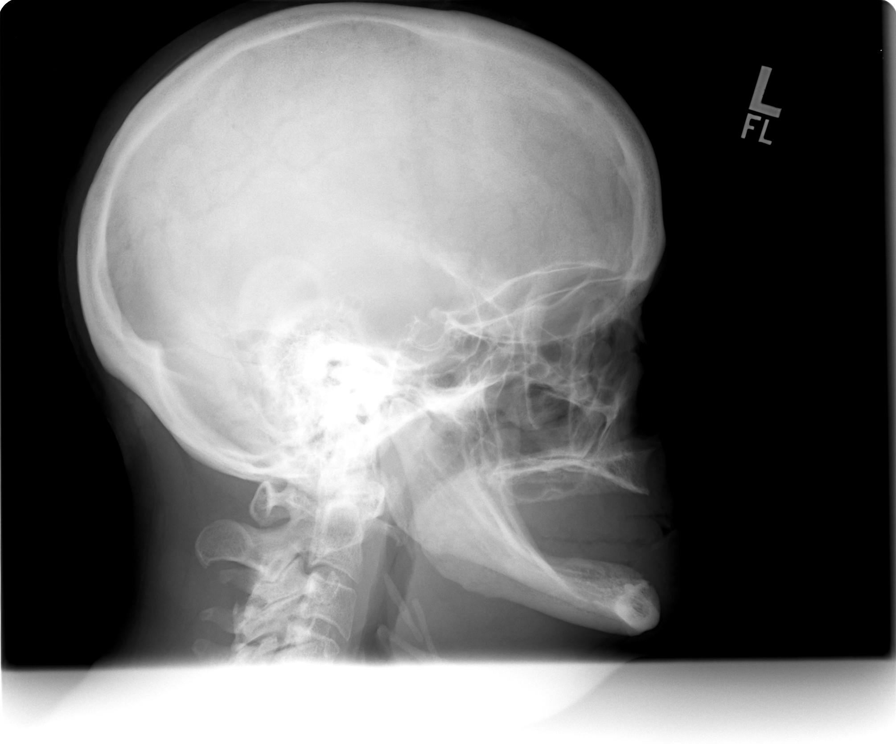

[view not recorded (7 of 8)]
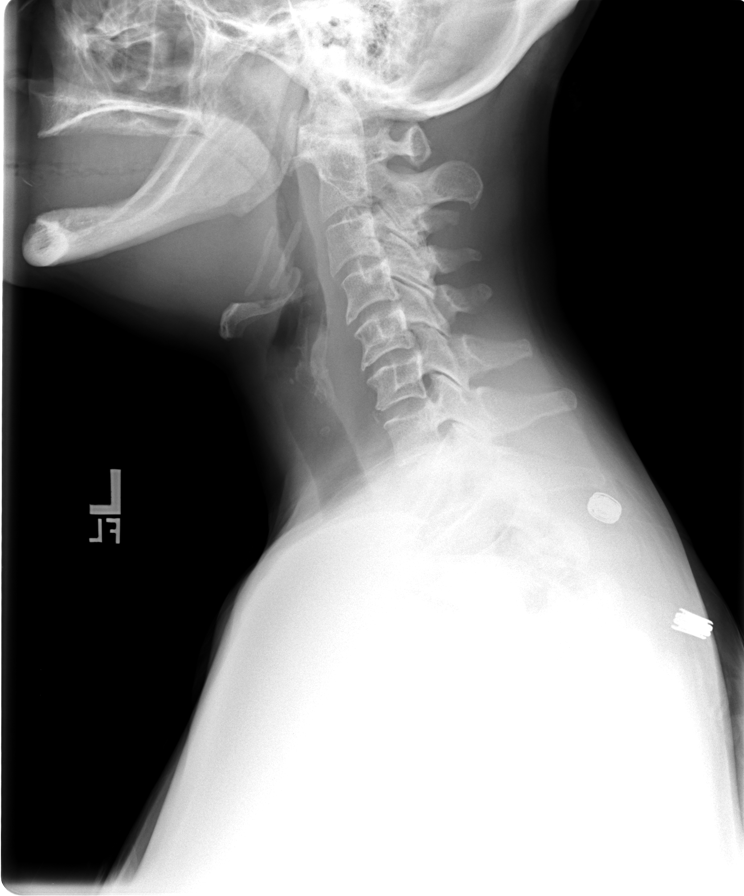

[view not recorded (8 of 8)]
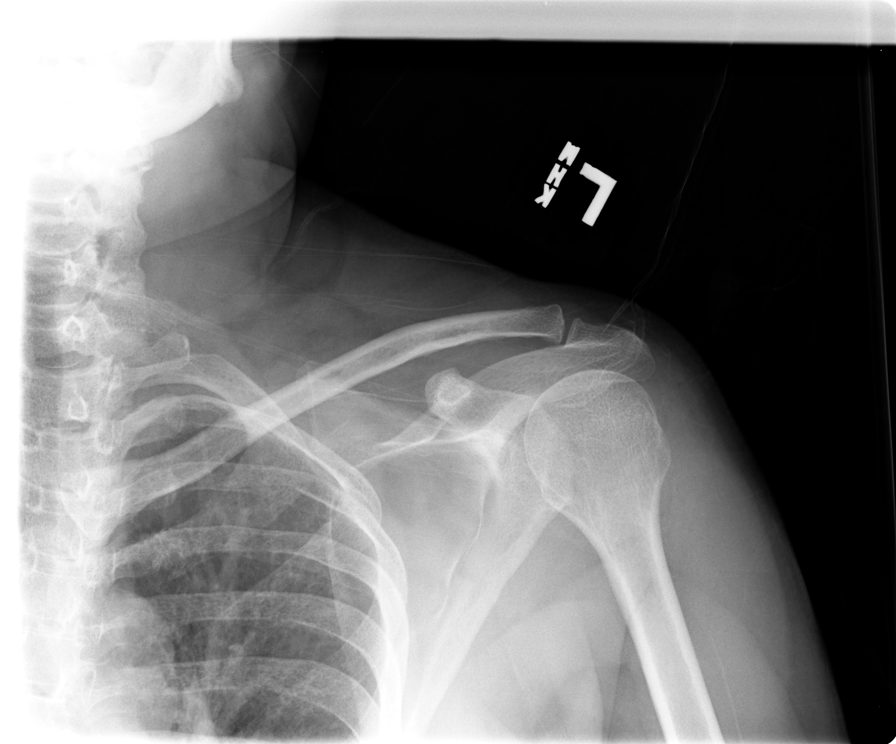

[8 of 10 positions shown; findings below may reference images not displayed]

IMPRESSION: No evidence for bony lucency?s to suggest multiple myeloma or metastatic involvement.

## 2004-07-30 ENCOUNTER — Encounter (HOSPITAL_COMMUNITY): Admission: RE | Admit: 2004-07-30 | Discharge: 2004-09-05 | Payer: Self-pay | Admitting: Nephrology

## 2004-07-30 ENCOUNTER — Ambulatory Visit (HOSPITAL_COMMUNITY): Payer: Self-pay | Admitting: Nephrology

## 2004-09-07 ENCOUNTER — Emergency Department (HOSPITAL_COMMUNITY): Admission: EM | Admit: 2004-09-07 | Discharge: 2004-09-07 | Payer: Self-pay | Admitting: Emergency Medicine

## 2004-09-12 ENCOUNTER — Ambulatory Visit: Payer: Self-pay | Admitting: Internal Medicine

## 2004-10-07 ENCOUNTER — Encounter (HOSPITAL_COMMUNITY): Admission: RE | Admit: 2004-10-07 | Discharge: 2004-11-04 | Payer: Self-pay | Admitting: Nephrology

## 2004-10-07 ENCOUNTER — Ambulatory Visit (HOSPITAL_COMMUNITY): Payer: Self-pay | Admitting: General Surgery

## 2004-10-21 ENCOUNTER — Ambulatory Visit (HOSPITAL_COMMUNITY): Payer: Self-pay | Admitting: Nephrology

## 2004-11-04 ENCOUNTER — Ambulatory Visit (HOSPITAL_COMMUNITY): Admission: RE | Admit: 2004-11-04 | Discharge: 2004-11-04 | Payer: Self-pay | Admitting: Family Medicine

## 2004-11-11 ENCOUNTER — Encounter (HOSPITAL_COMMUNITY): Admission: RE | Admit: 2004-11-11 | Discharge: 2004-11-29 | Payer: Self-pay | Admitting: Nephrology

## 2004-11-19 ENCOUNTER — Ambulatory Visit (HOSPITAL_COMMUNITY): Admission: RE | Admit: 2004-11-19 | Discharge: 2004-11-19 | Payer: Self-pay | Admitting: Family Medicine

## 2004-11-19 IMAGING — US US BREAST*L*
1 series · 7 of 7 positions shown · non-contrast
Comparison: none

LEFT BREAST ULTRASOUND:
CLINICAL DATA: The patient returns after screening study on [DATE].

[Series 1: unknown · 0.08mm/px · 7 of 7 slices shown]
[im 1/7]
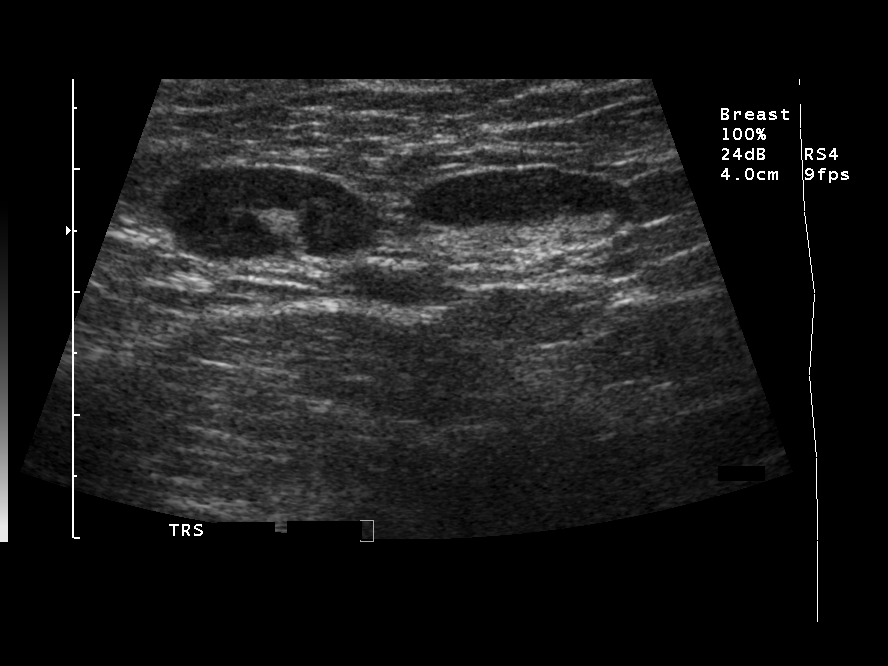
[im 2/7]
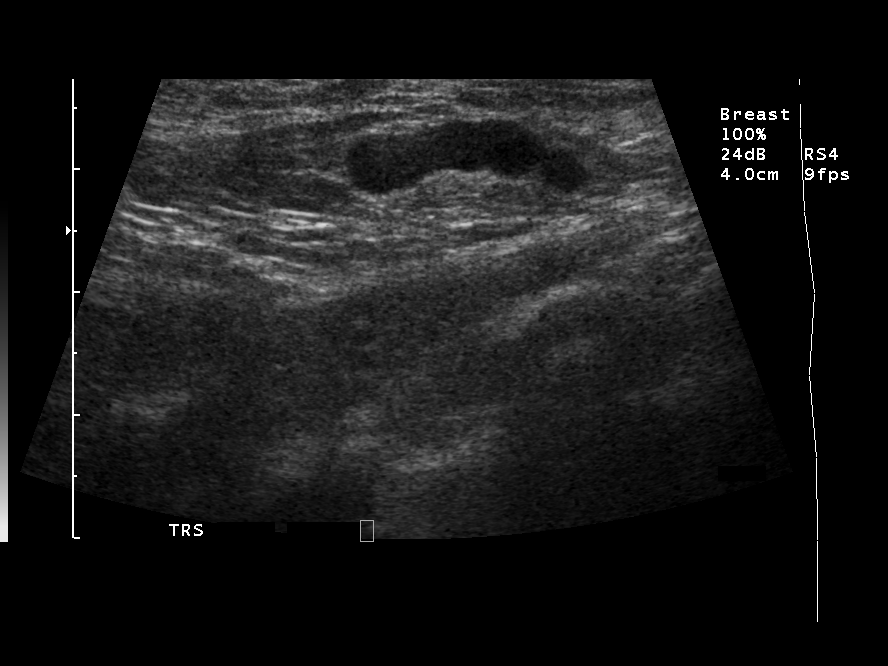
[im 3/7]
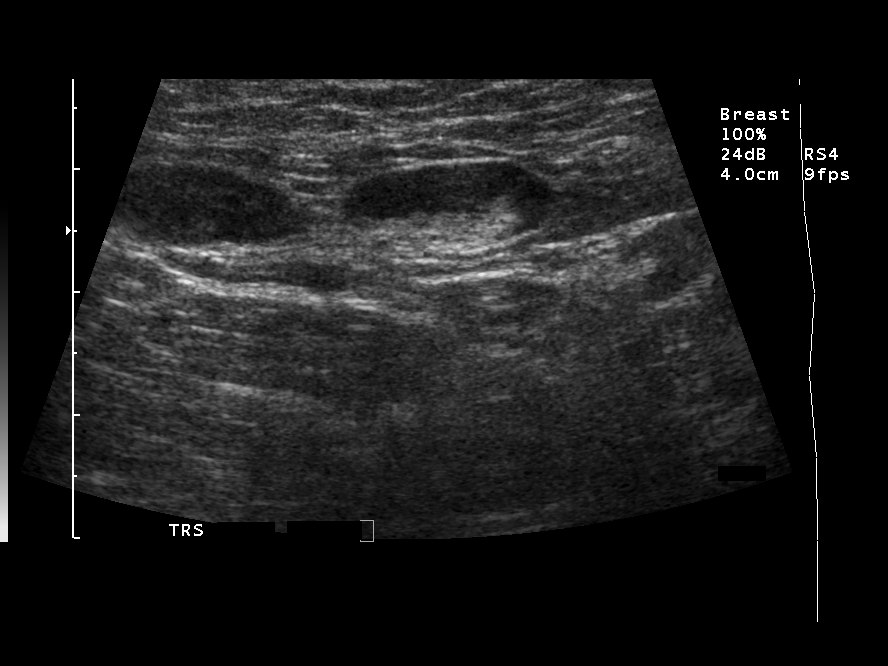
[im 4/7]
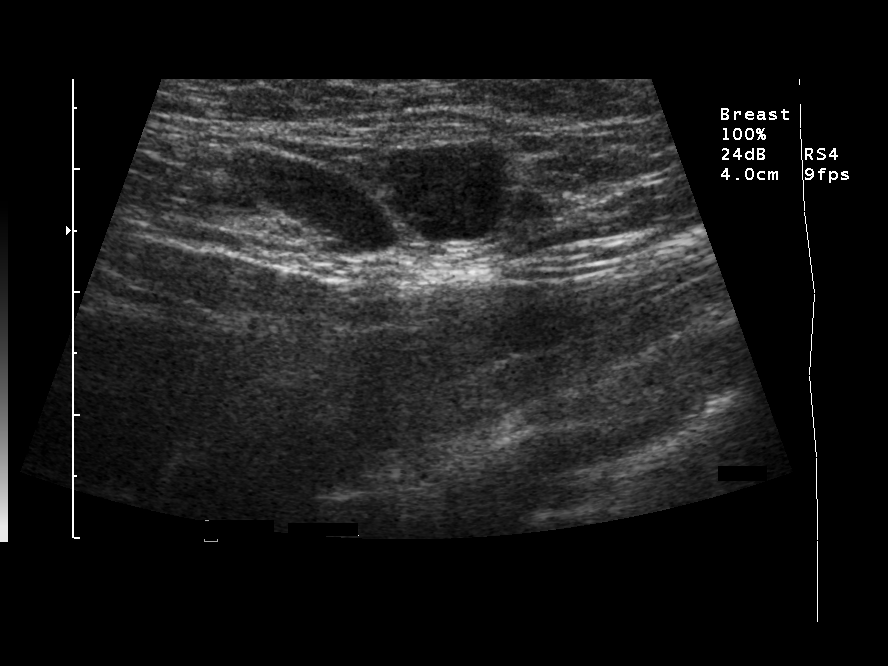
[im 5/7]
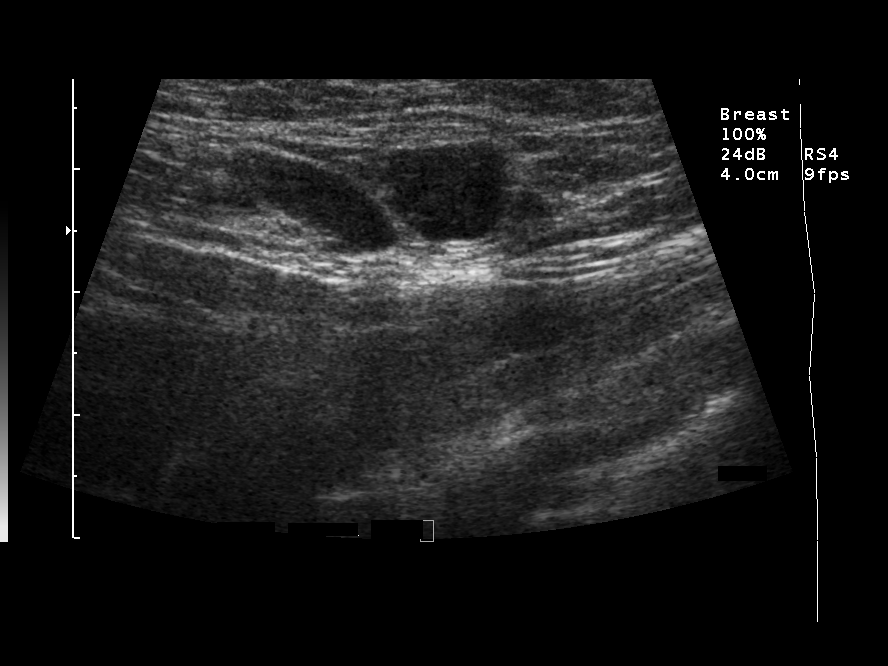
[im 6/7]
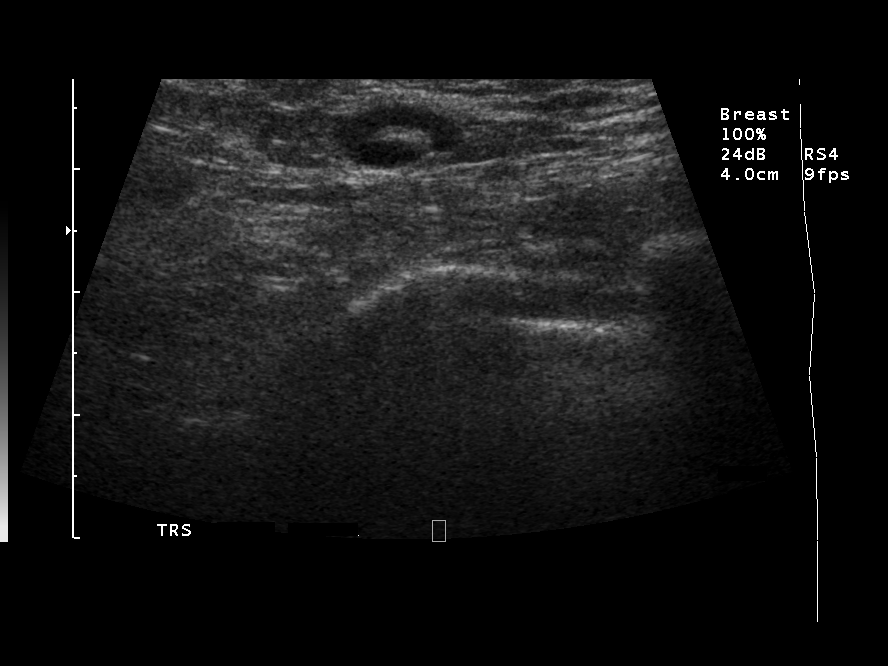
[im 7/7]
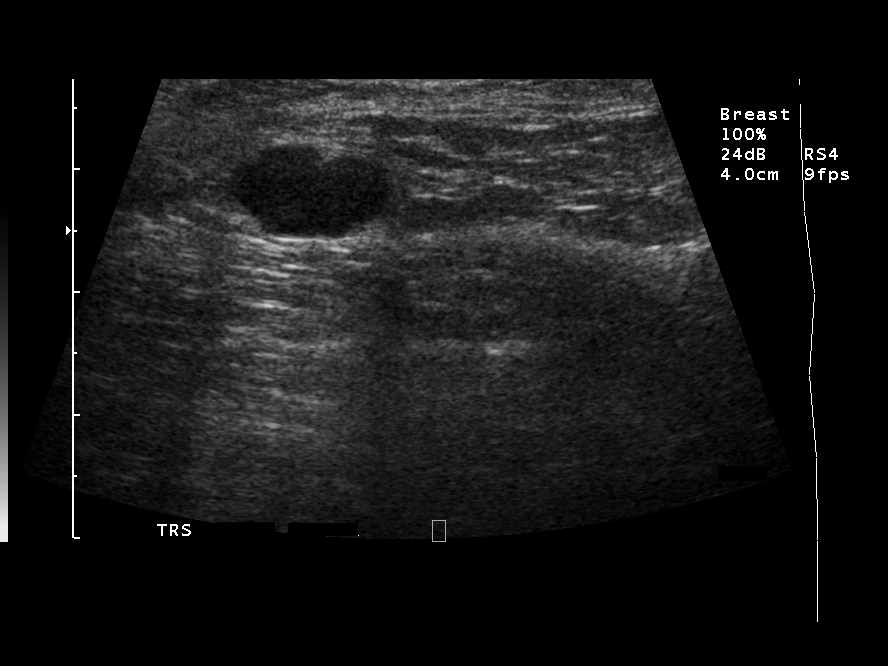

[7 of 7 positions shown; findings below may reference images not displayed]

Ultrasound is performed of the left axilla showing numerous lymph nodes with cortical thickening.  
Some of these are palpable on physical exam.  The patient had prior right axillary node excisional 
biopsy which showed plasma cell follicular hyperplasia secondary to infectious process.  The 
patient was also noted to have diffuse lymphadenopathy at that time.  As such, the left axillary 
lymph nodes are likely related to the patient's ongoing medical problems.  There is no evidence for
breast malignancy.  Annual mammography is recommended.

ASSESSMENT: Benign - BI-RADS 2

Screening mammogram of both breasts in 1 year.

## 2004-12-02 ENCOUNTER — Encounter: Admission: RE | Admit: 2004-12-02 | Discharge: 2004-12-03 | Payer: Self-pay | Admitting: Oncology

## 2004-12-02 ENCOUNTER — Ambulatory Visit (HOSPITAL_COMMUNITY): Admission: RE | Admit: 2004-12-02 | Discharge: 2004-12-02 | Payer: Self-pay | Admitting: Nephrology

## 2004-12-02 ENCOUNTER — Encounter (HOSPITAL_COMMUNITY): Admission: RE | Admit: 2004-12-02 | Discharge: 2005-01-01 | Payer: Self-pay | Admitting: Oncology

## 2004-12-02 IMAGING — US US RENAL
1 series · 14 of 25 positions shown · non-contrast
Comparison: none

HISTORY: Renal insufficiency

[Series 1: unknown · 0.25mm/px · 14 of 28 slices shown]
[im 1/28]
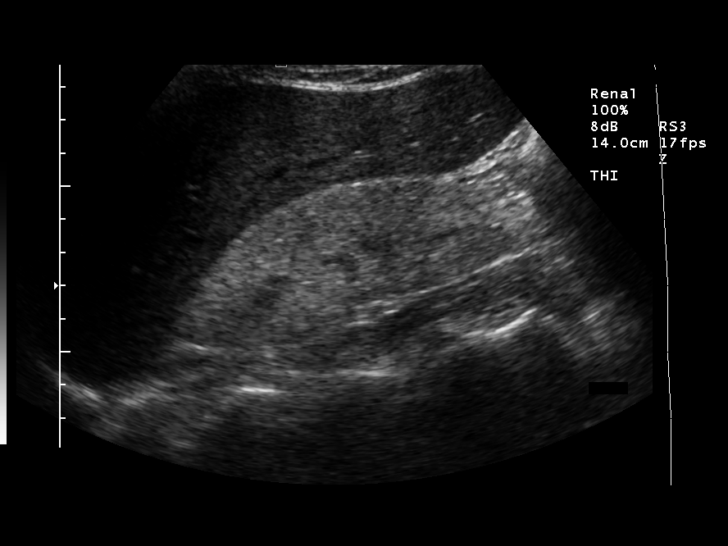
[im 3/28]
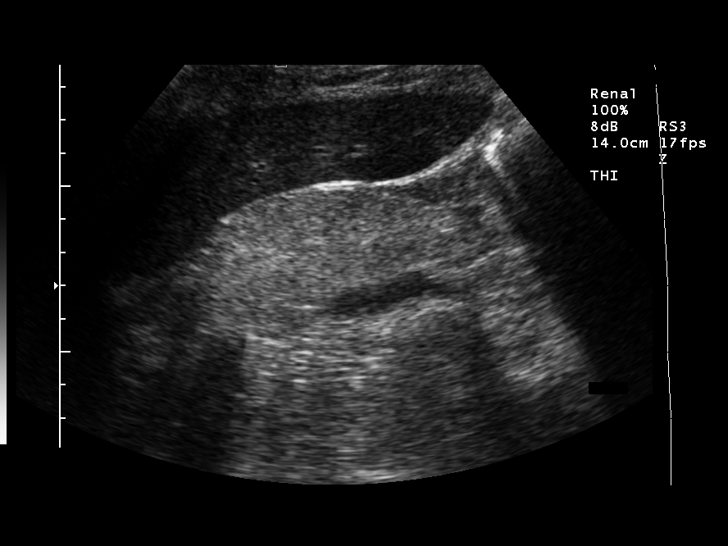
[im 5/28]
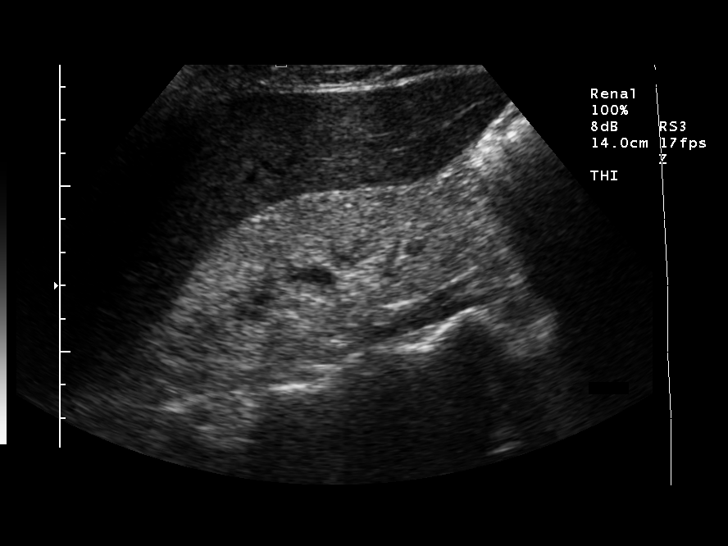
[im 7/28]
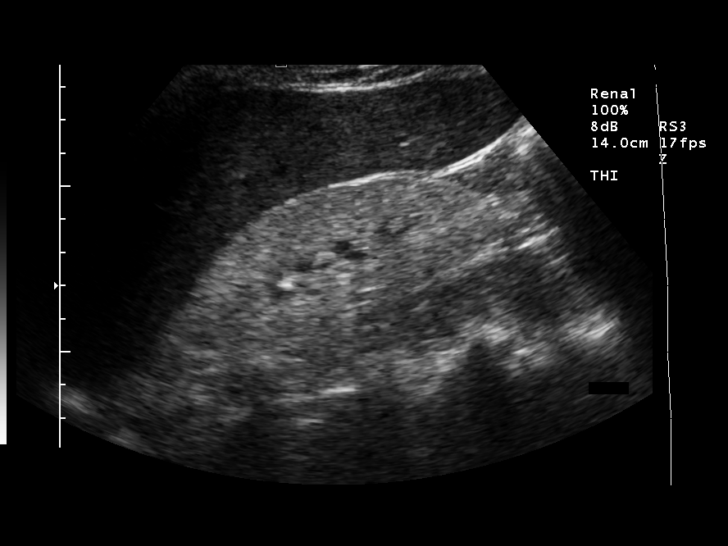
[im 10/28]
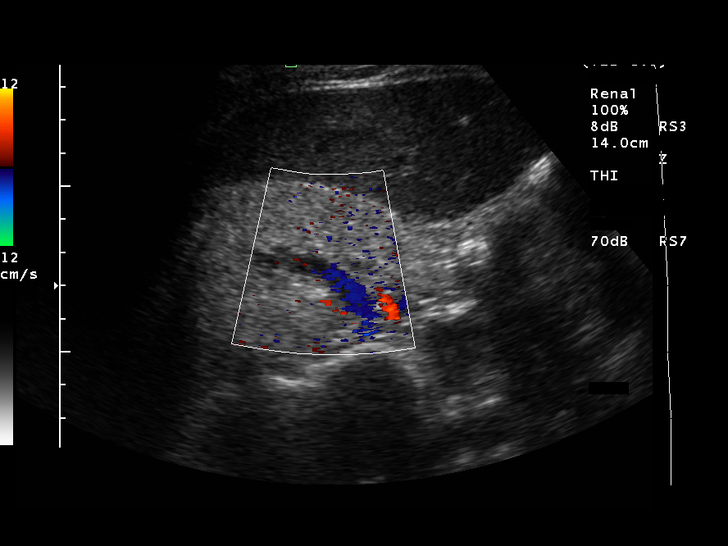
[im 11/28]
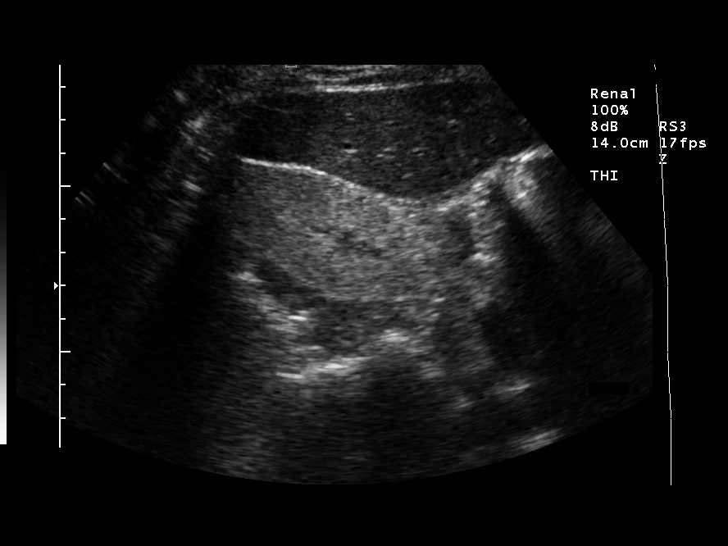
[im 13/28]
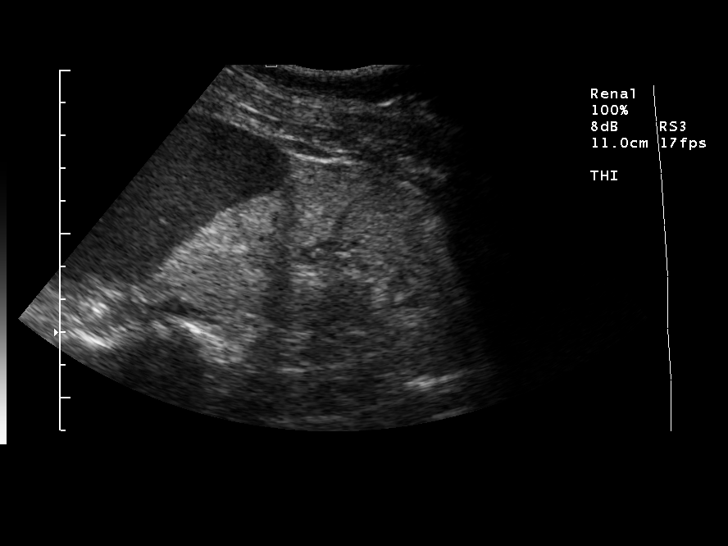
[im 15/28]
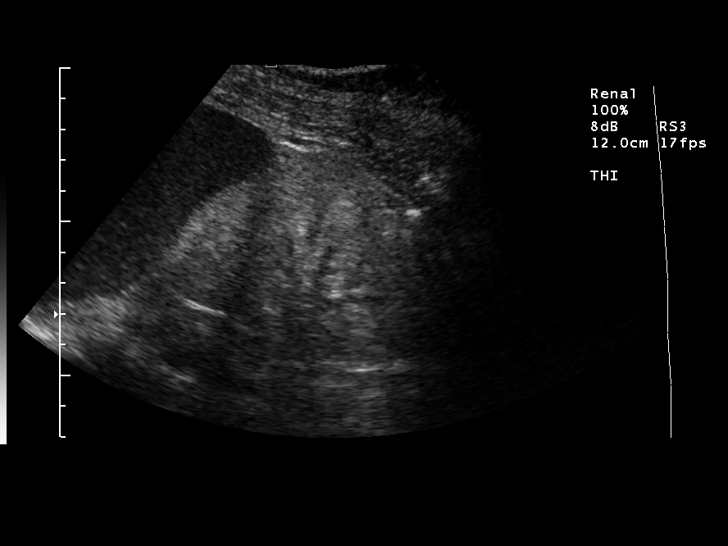
[im 17/28]
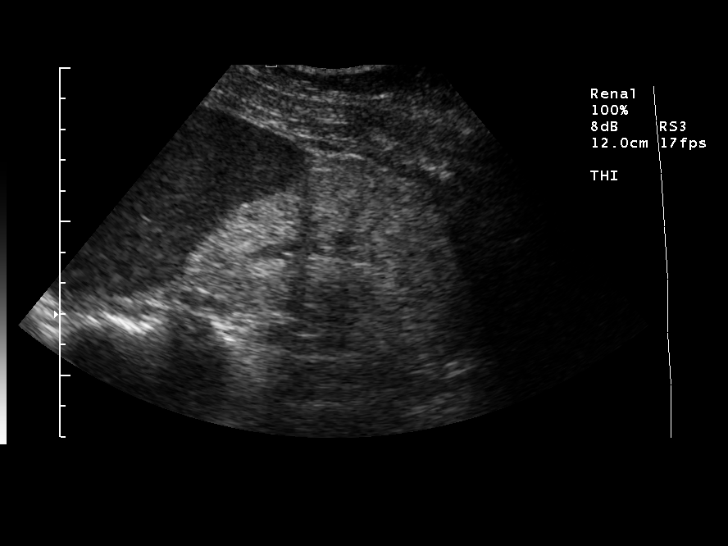
[im 19/28]
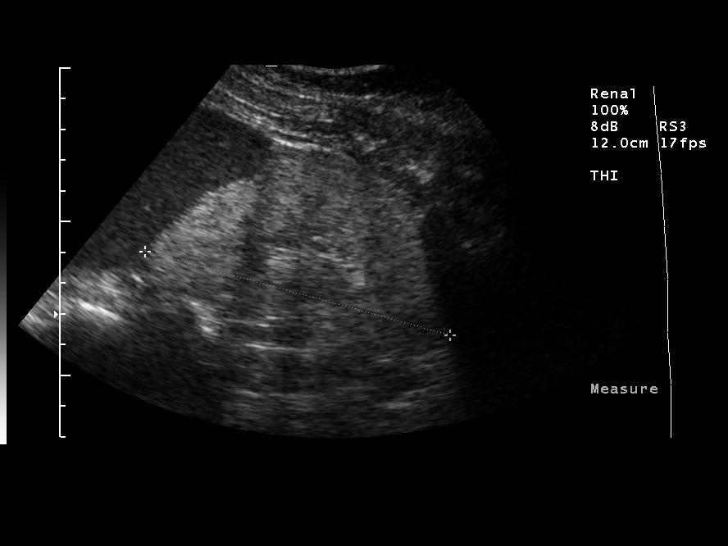
[im 21/28]
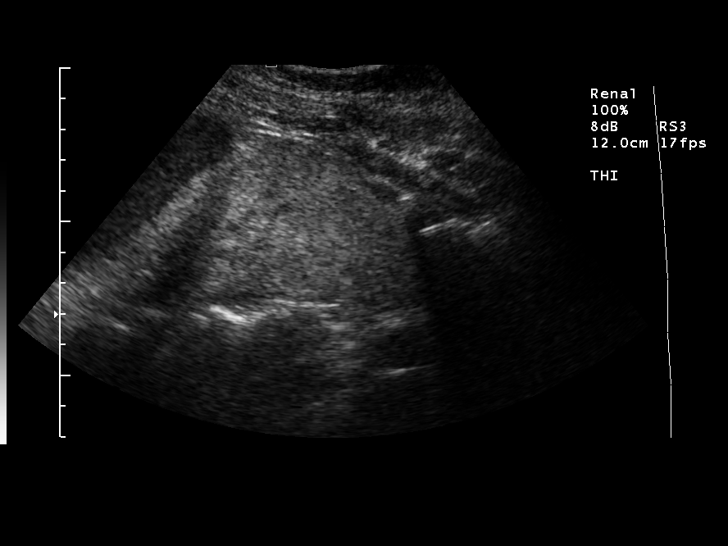
[im 23/28]
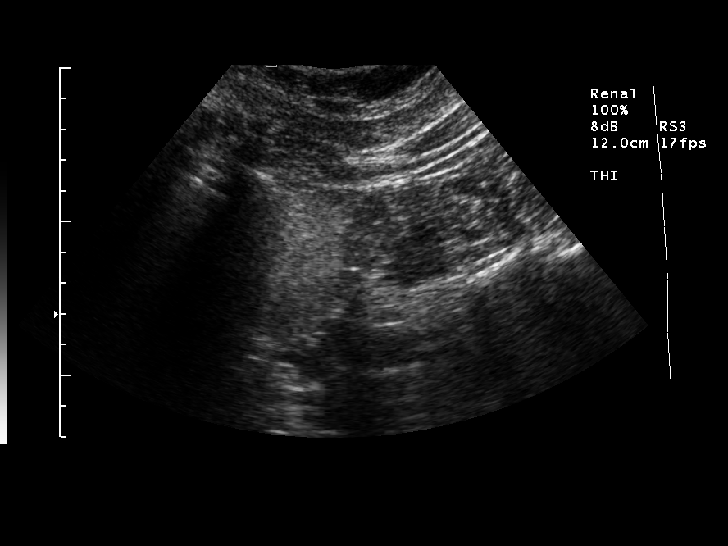
[im 25/28]
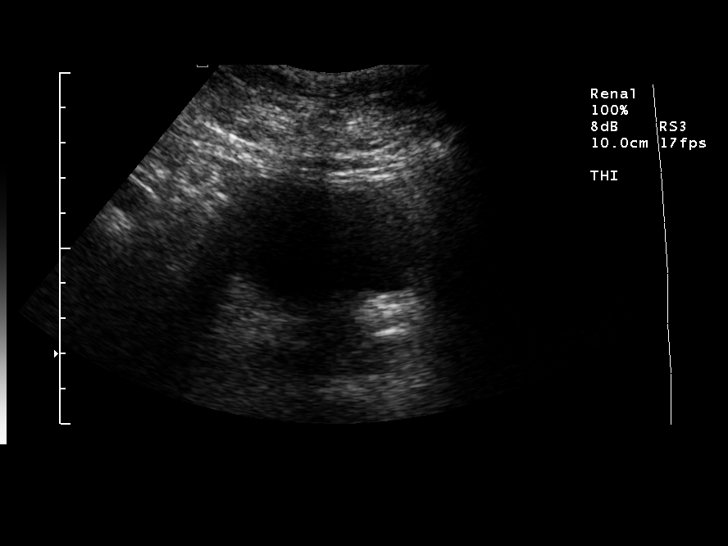
[im 28/28]
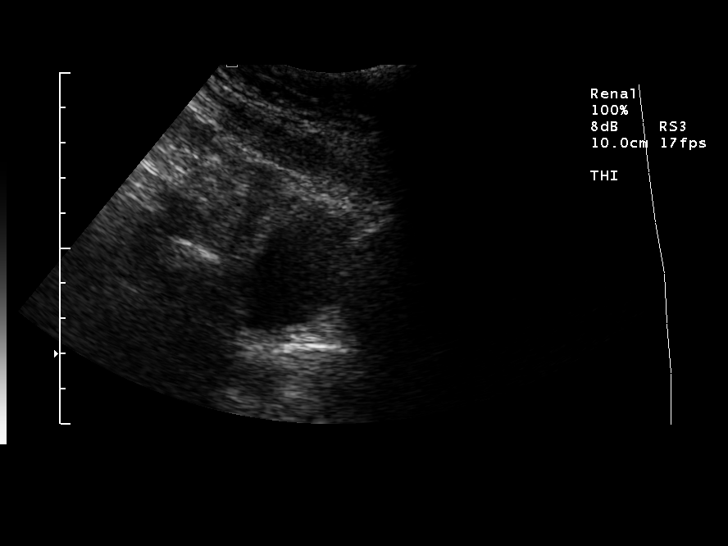

[14 of 25 positions shown; findings below may reference images not displayed]

RENAL ULTRASOUND:

Kidneys measure 11.0 cm length right and 10.2 cm length left.
Renal cortical thickness normal.
Renal cortical echogenicity significantly increased bilaterally compatible with
medical renal disease.
No hydronephrosis, showing calcification, or perinephric fluid collection.
No evidence of renal mass.
Bladder unremarkable.
IMPRESSION: Markedly echogenic kidneys bilaterally compatible with medical renal disease.

## 2004-12-09 ENCOUNTER — Ambulatory Visit (HOSPITAL_COMMUNITY): Payer: Self-pay | Admitting: Nephrology

## 2004-12-12 ENCOUNTER — Ambulatory Visit (HOSPITAL_COMMUNITY): Admission: RE | Admit: 2004-12-12 | Discharge: 2004-12-12 | Payer: Self-pay | Admitting: Internal Medicine

## 2004-12-12 IMAGING — US US ABDOMEN COMPLETE
1 series · 14 of 25 positions shown · non-contrast
Comparison: none

HISTORY: Abdominal pain

[Series 1: unknown · 0.33mm/px · 14 of 62 slices shown]
[im 1/62]
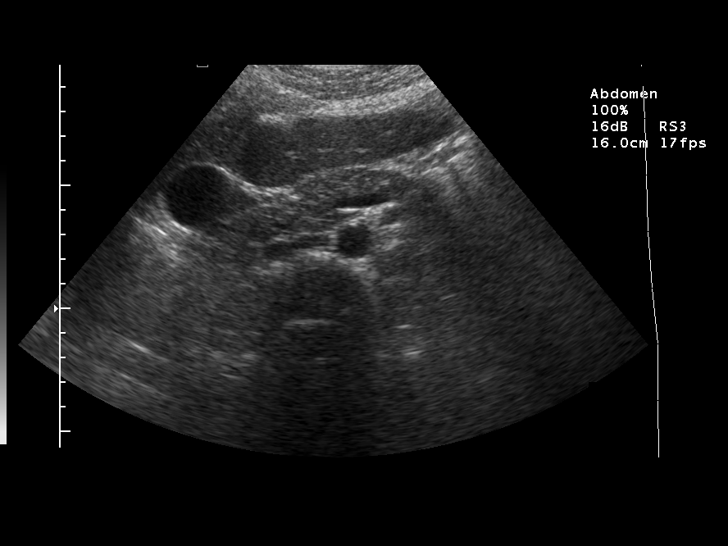
[im 6/62]
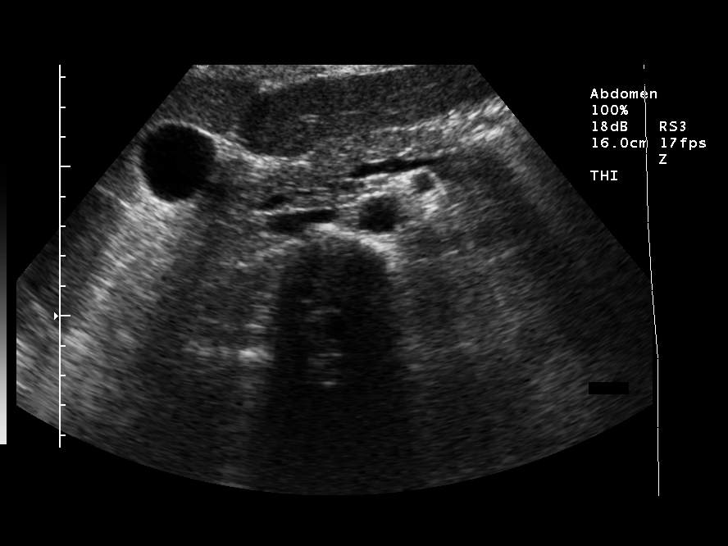
[im 11/62]
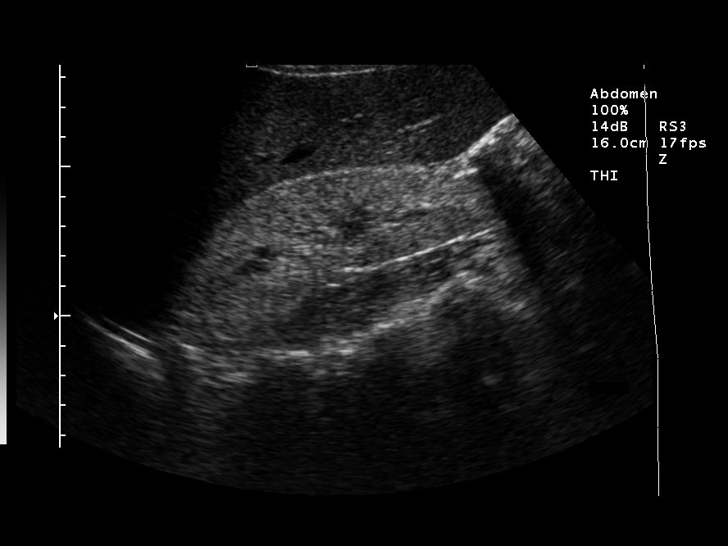
[im 16/62]
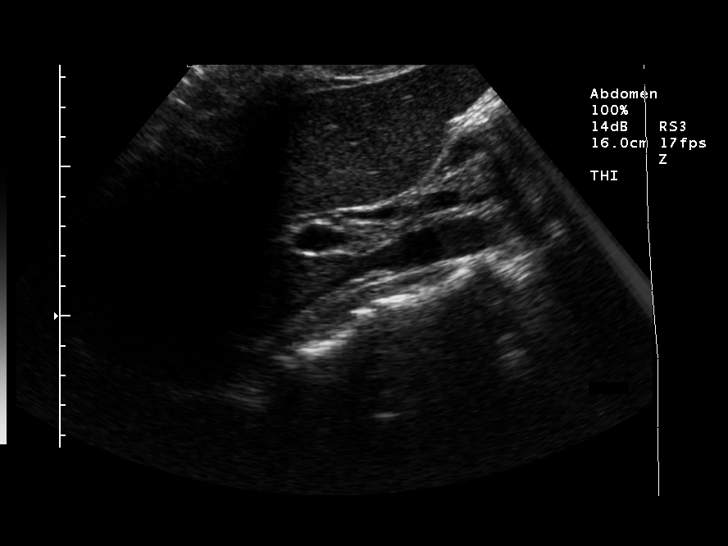
[im 21/62]
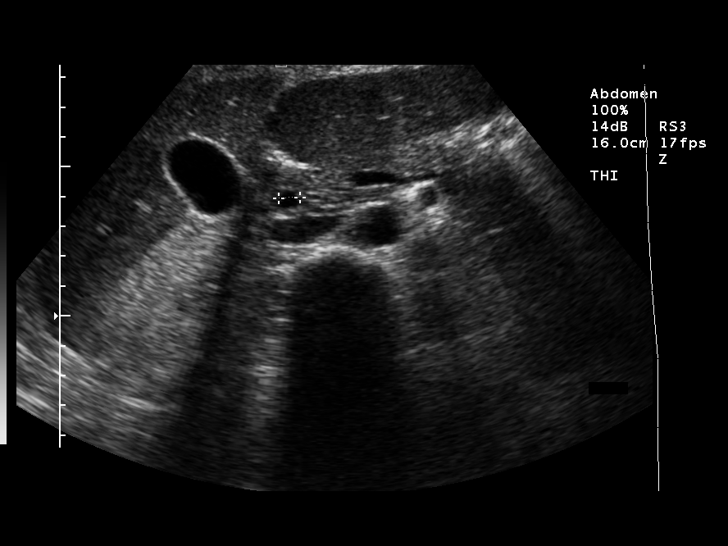
[im 23/62]
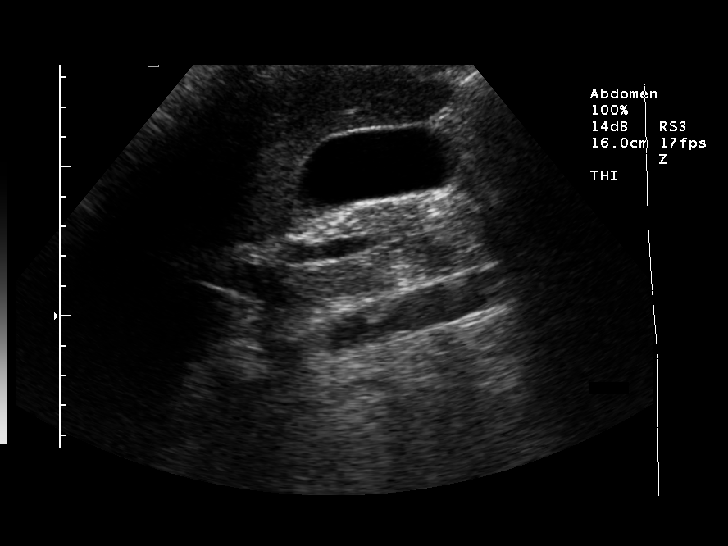
[im 28/62]
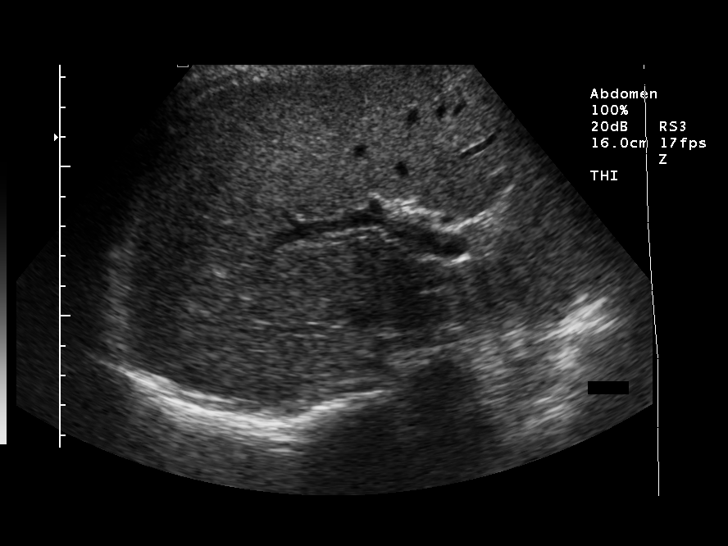
[im 34/62]
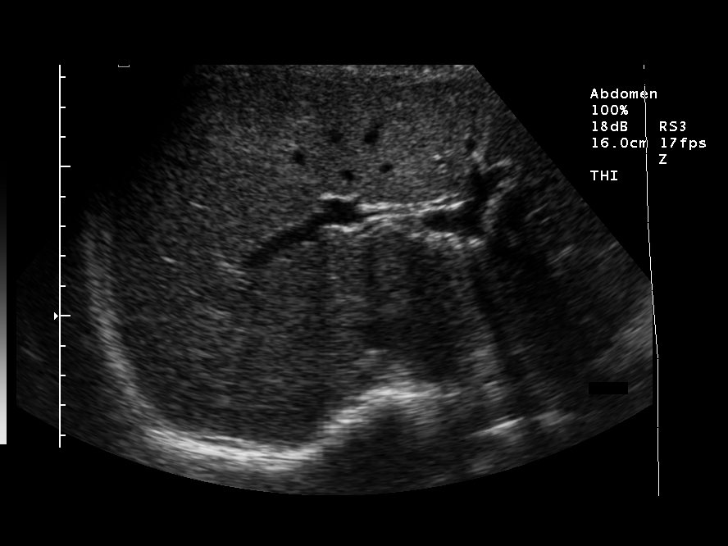
[im 39/62]
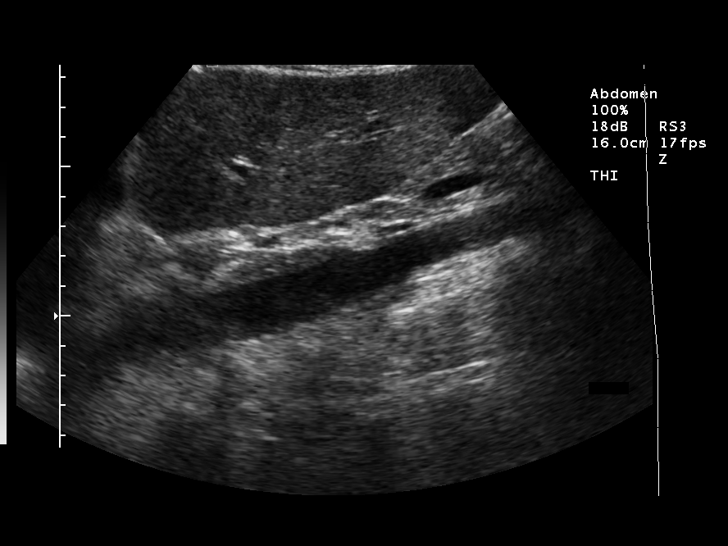
[im 41/62]
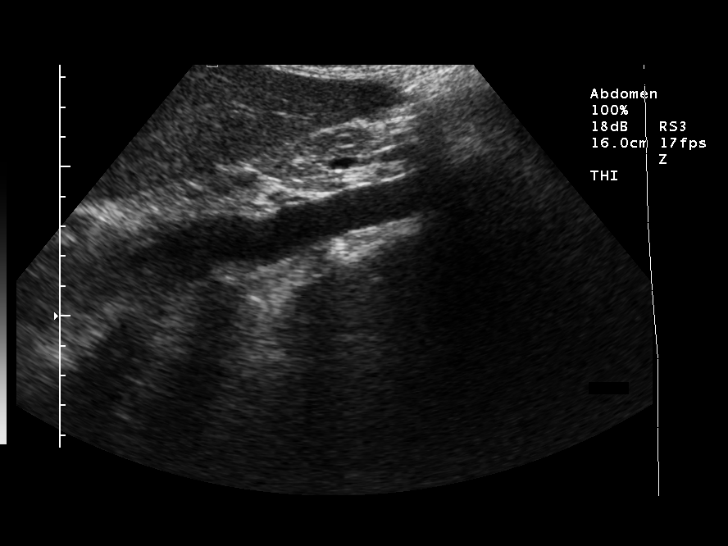
[im 46/62]
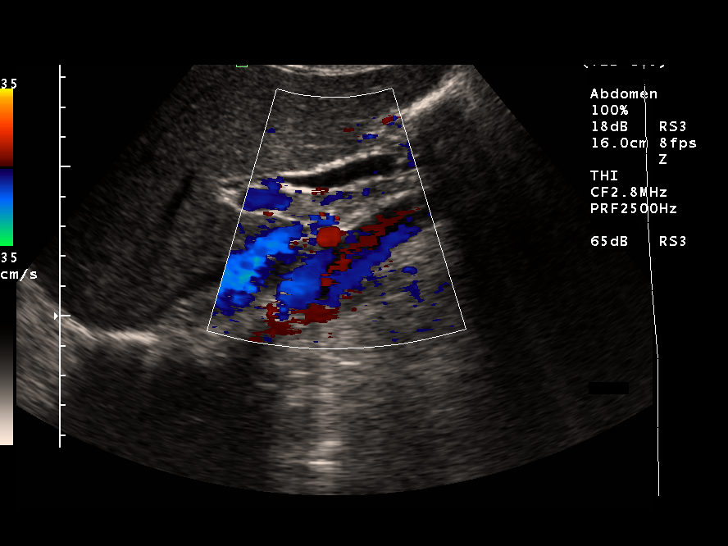
[im 51/62]
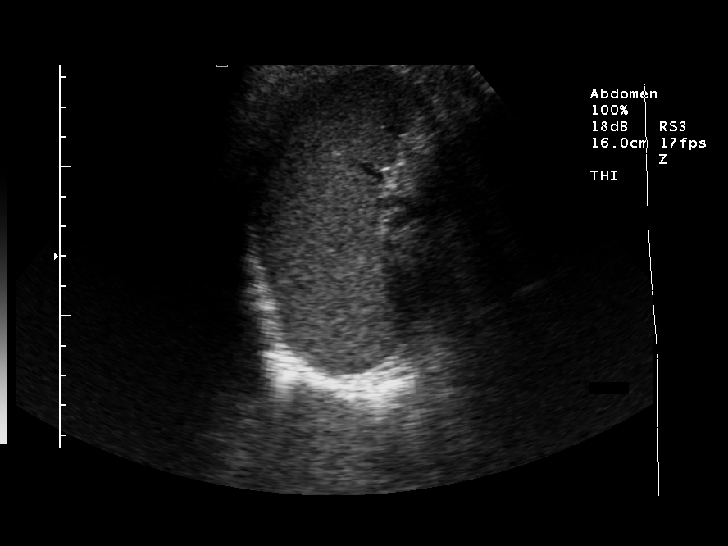
[im 56/62]
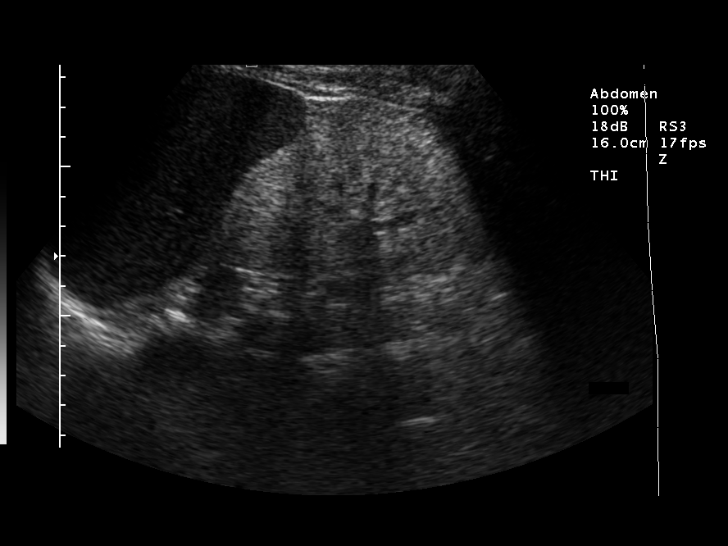
[im 62/62]
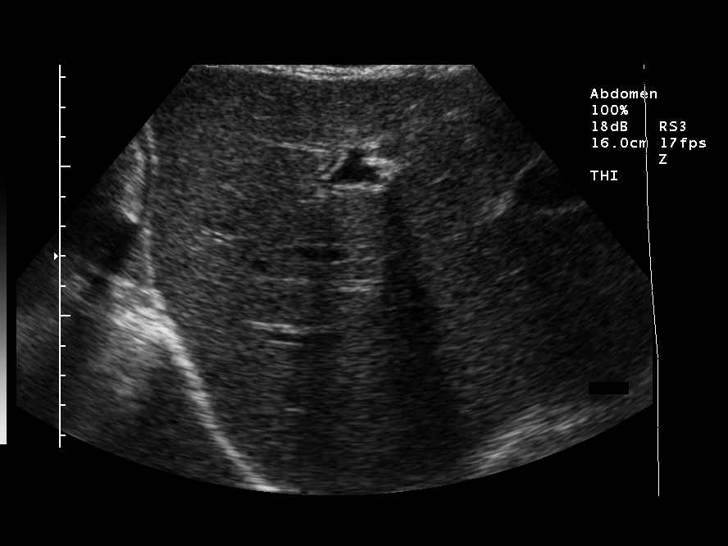

[14 of 25 positions shown; findings below may reference images not displayed]

ULTRASOUND ABDOMEN COMPLETE:

Gallbladder normally distended without stones or wall thickening.
Common bile duct normal caliber 4 mm diameter proximally, increasing distally to
7 mm diameter.
This could represent physiologic dilatation, but recommend correlation with
LFTs.
Minimal central intrahepatic biliary prominence.
No focal hepatic mass.
Pancreas and spleen normal appearance, spleen 10.8 cm length.
Kidneys measure 10.5 cm length right and 10.6 cm length left.
Marked increased in renal cortical echogenicity diffusely compatible with
medical renal disease.
No renal mass or hydronephrosis.
Aorta and IVC normal.
IMPRESSION: Mild biliary prominence, potentially physiologic, recommend correlation with
LFTs to exclude obstruction.
Medical renal disease changes bilateral kidneys.

## 2004-12-19 ENCOUNTER — Encounter (HOSPITAL_COMMUNITY): Admission: RE | Admit: 2004-12-19 | Discharge: 2005-01-18 | Payer: Self-pay | Admitting: Internal Medicine

## 2004-12-19 IMAGING — NM NM HEPATO W/GB/PHARM/[PERSON_NAME]
2 series · 12 of 12 positions shown · non-contrast
Comparison: none

HISTORY: Right upper quadrant pain

[Series 1: hepatobiliary · 3.20mm/px · 6 of 60 frames shown (1 of 2)]
[frame 6/60]
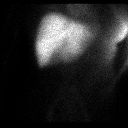
[frame 16/60]
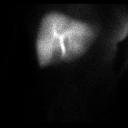
[frame 26/60]
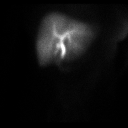
[frame 36/60]
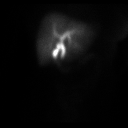
[frame 46/60]
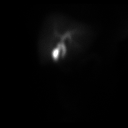
[frame 56/60]
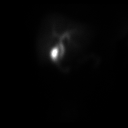

[Series 1: hepatobiliary · 3.20mm/px · 6 of 60 frames shown (2 of 2)]
[frame 6/60]
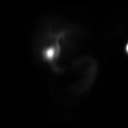
[frame 16/60]
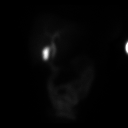
[frame 26/60]
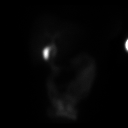
[frame 36/60]
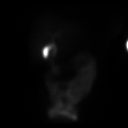
[frame 46/60]
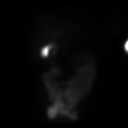
[frame 56/60]
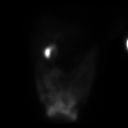

[12 of 12 positions shown; findings below may reference images not displayed]

HEPATOBILIARY SCAN WITH EJECTION FRACTION:

Hepatobiliary imaging performed using 5 mCi [PX] mebrofenin.

Prompt tracer extraction from blood stream, indicating normal hepatocellular
function.
Prompt excretion of tracer into biliary tree.
Gallbladder visualized x 30 minutes.
Small bowel visualized x 50 minutes.
No hepatic retention of tracer.

At 1 hour, patient ingested half-and-half, and imaging was continued for 60
minutes.
Gallbladder demonstrates normal emptying of tracer following fatty meal
stimulation.
Calculated gallbladder ejection fraction 55%, normal.
IMPRESSION: Normal exam.

## 2005-01-07 ENCOUNTER — Ambulatory Visit (HOSPITAL_COMMUNITY): Admission: RE | Admit: 2005-01-07 | Discharge: 2005-01-07 | Payer: Self-pay | Admitting: Vascular Surgery

## 2005-01-07 IMAGING — CR DG CHEST 2V
2 series · 2 of 2 positions shown · non-contrast
Comparison: [DATE].

CLINICAL DATA: Renal failure ? preop for Diatek catheter placement.
 CHEST- 2 VIEW:

[view not recorded (1 of 2)]
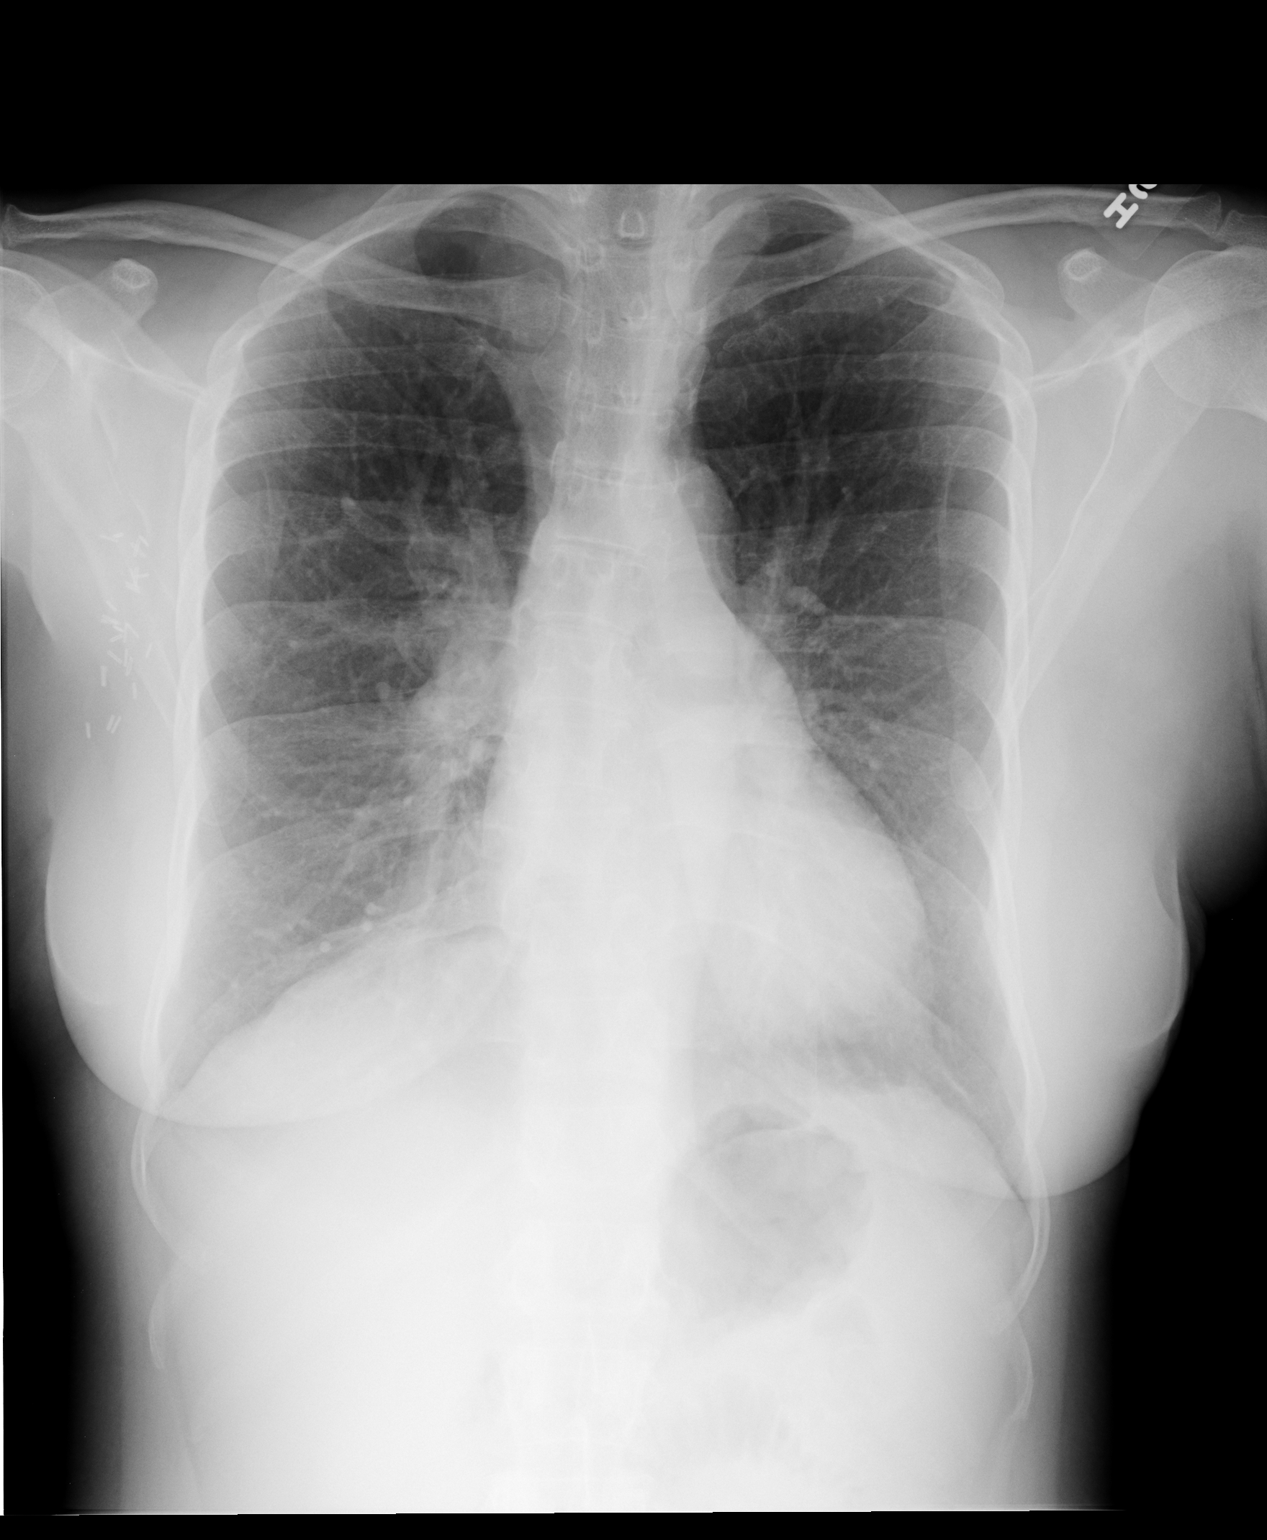

[view not recorded (2 of 2)]
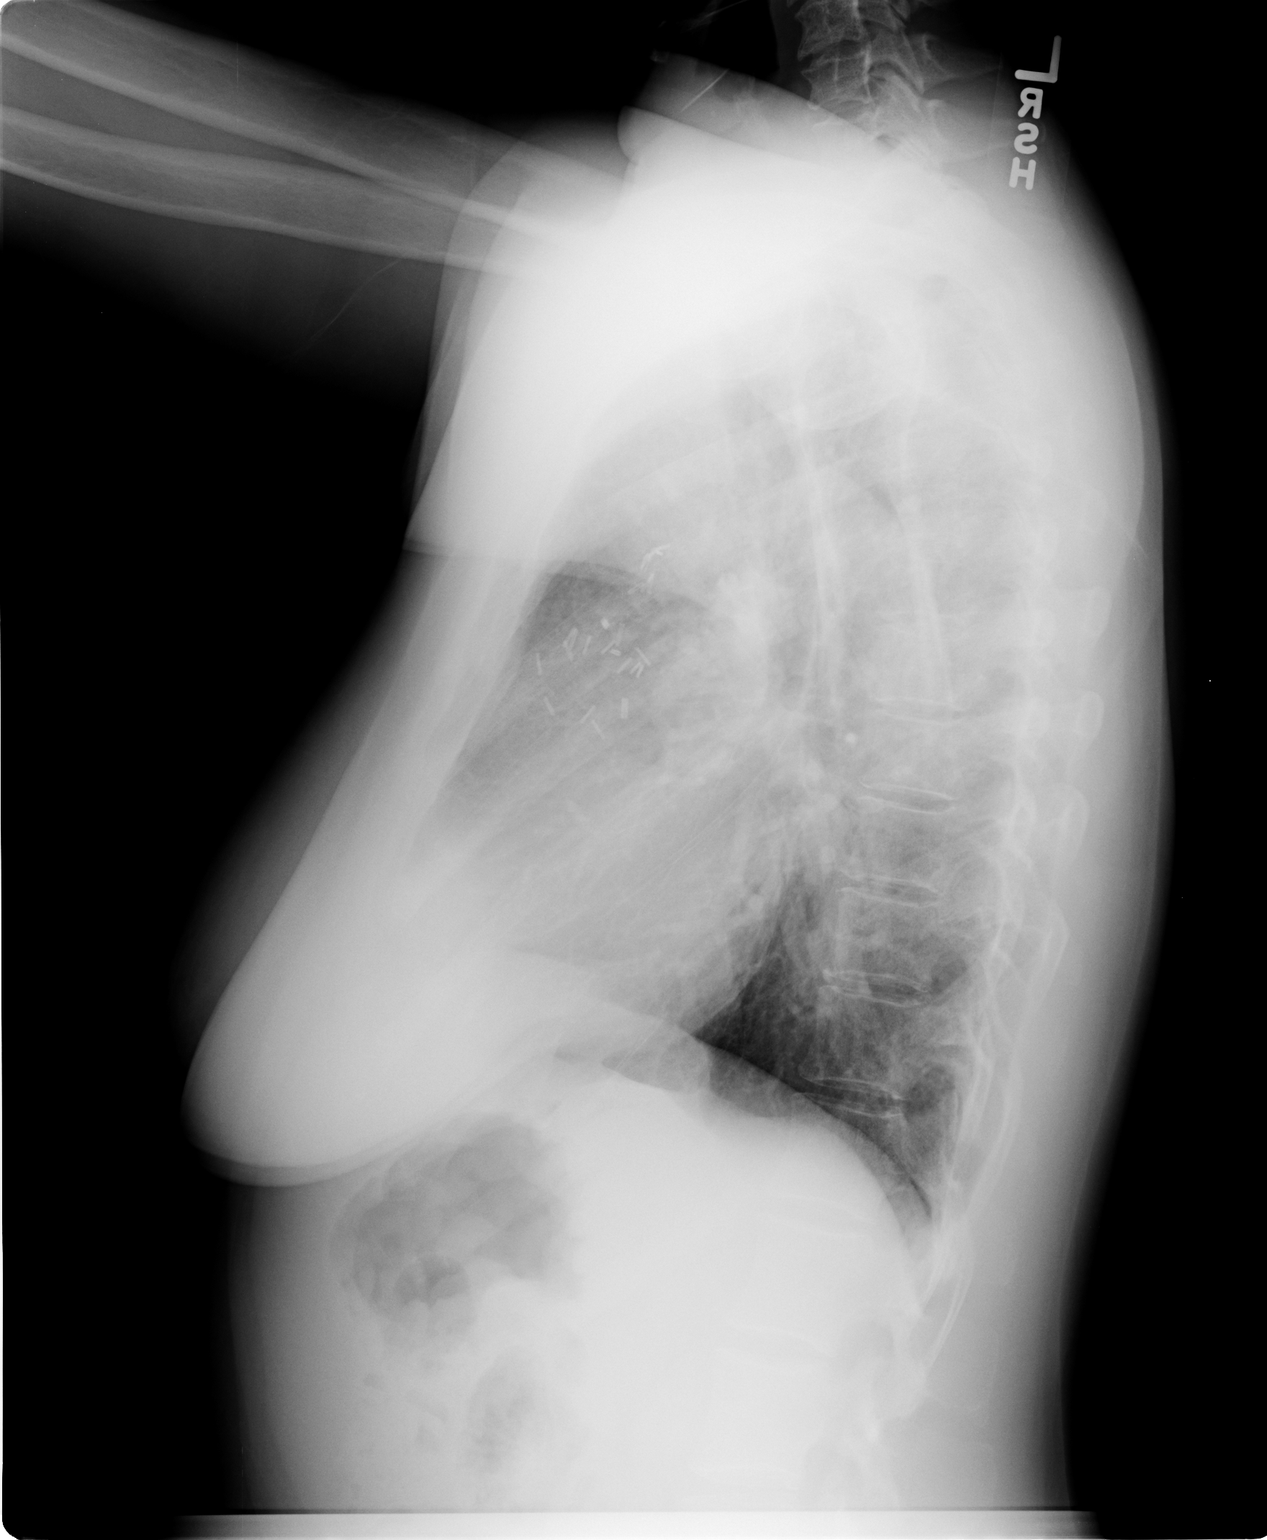

[2 of 2 positions shown; findings below may reference images not displayed]

FINDINGS: Heart size within normal limits. Slight prominence of the pulmonary artery segment and right hilum.   Stable since [DATE] and therefore unlikely to be clinically significant.   Prior right axillary node dissection.
IMPRESSION: Prior axillary node dissection on the right ? no active cardiopulmonary disease.

## 2005-01-07 IMAGING — CR DG CHEST 1V PORT
1 series · 1 of 1 positions shown · non-contrast
Comparison: none

CLINICAL DATA: Renal failure

[view not recorded]
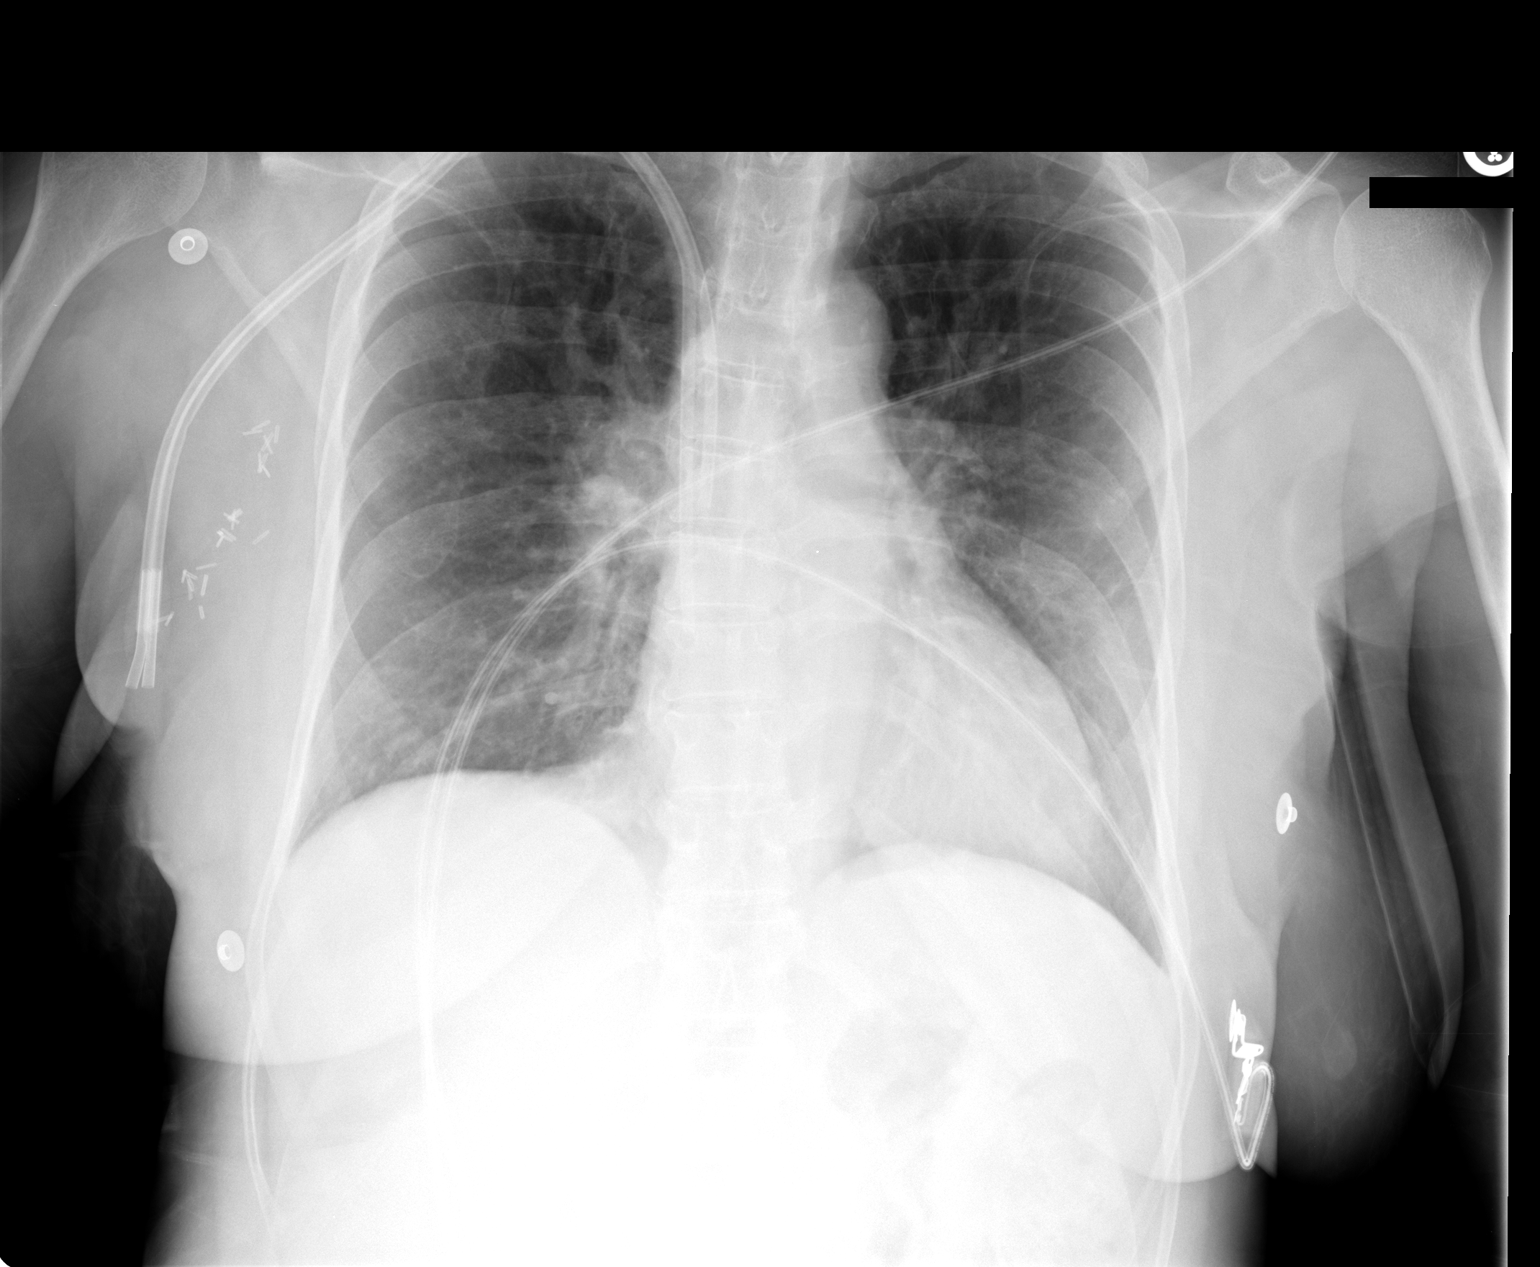

[1 of 1 positions shown; findings below may reference images not displayed]

Portable chest at [UN]:

Comparison  to earlier film of same day. There has been interval placement of a
right tunneled dialysis catheter, tips in the distal SVC and proximal RA. No
pneumothorax. Mild cardiomegaly stable. Prominent perihilar and bibasilar
interstitial markings stable. Vascular clips in the right axilla.
IMPRESSION: 1. Tunneled right hemodialysis catheter to cavoatrial junction. No pneumothorax.

## 2005-04-22 ENCOUNTER — Emergency Department (HOSPITAL_COMMUNITY): Admission: EM | Admit: 2005-04-22 | Discharge: 2005-04-22 | Payer: Self-pay | Admitting: Emergency Medicine

## 2005-04-24 ENCOUNTER — Emergency Department (HOSPITAL_COMMUNITY): Admission: EM | Admit: 2005-04-24 | Discharge: 2005-04-24 | Payer: Self-pay | Admitting: Emergency Medicine

## 2005-04-26 ENCOUNTER — Emergency Department (HOSPITAL_COMMUNITY): Admission: EM | Admit: 2005-04-26 | Discharge: 2005-04-26 | Payer: Self-pay | Admitting: Emergency Medicine

## 2005-05-05 ENCOUNTER — Ambulatory Visit (HOSPITAL_COMMUNITY): Admission: RE | Admit: 2005-05-05 | Discharge: 2005-05-05 | Payer: Self-pay | Admitting: Internal Medicine

## 2005-05-05 IMAGING — CR DG CHEST 2V
2 series · 2 of 2 positions shown · non-contrast
Comparison: none

CLINICAL DATA: Cough.  Congestion.  Sinus infection.  Dialysis patient.   Rhinorrhea. 
 CHEST - 2 VIEW:

[view not recorded (1 of 2)]
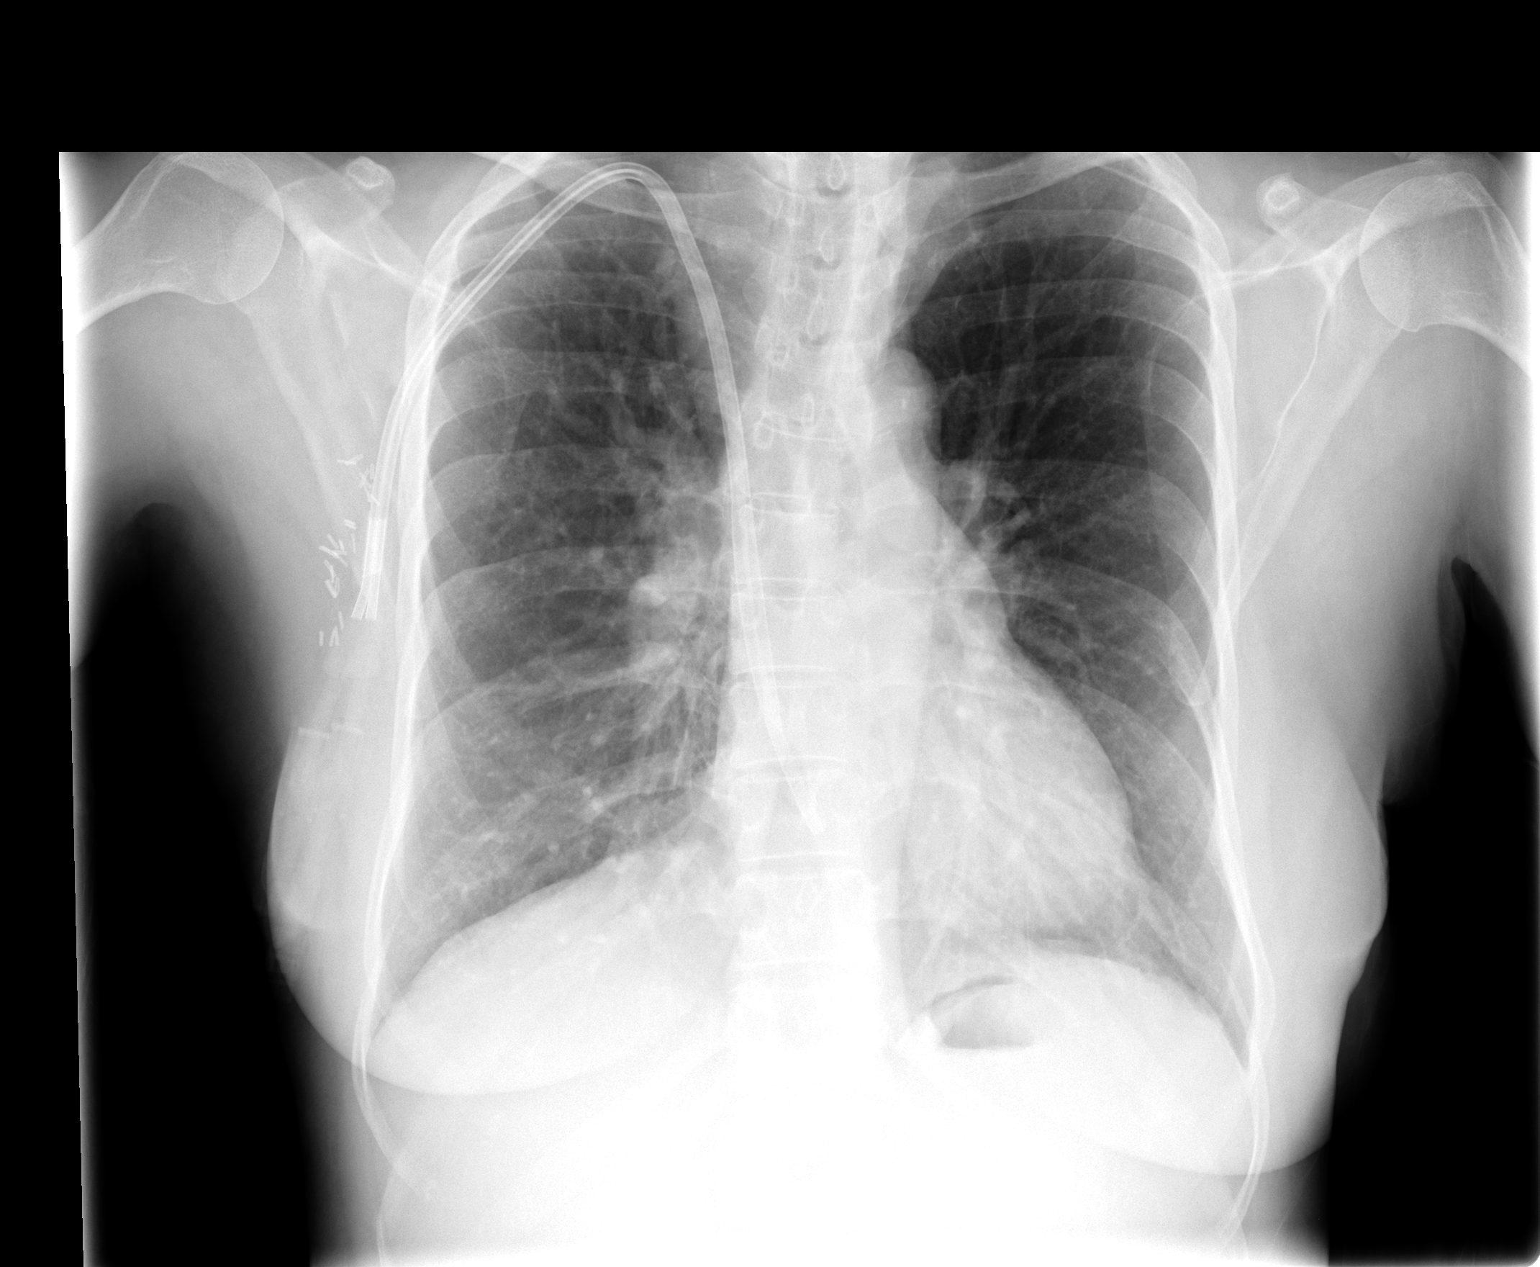

[view not recorded (2 of 2)]
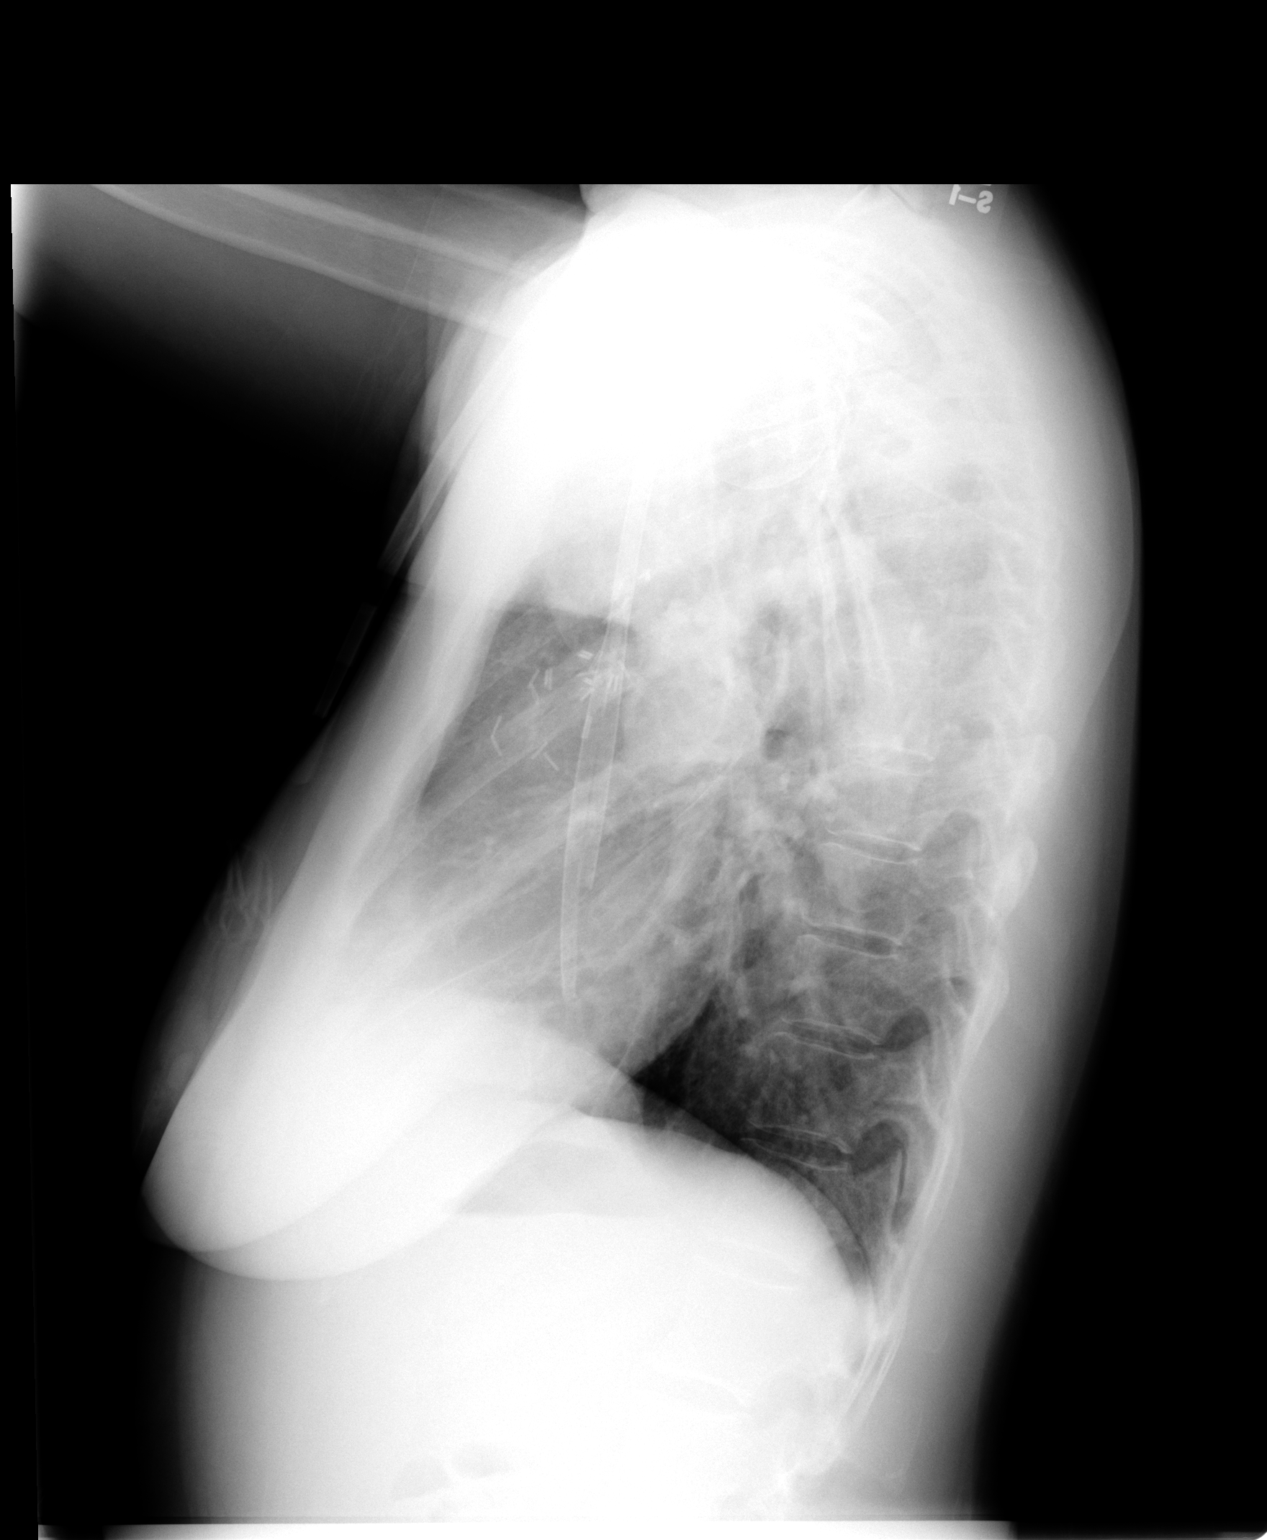

[2 of 2 positions shown; findings below may reference images not displayed]

FINDINGS: PA and lateral views of the chest are made and are compared to previous studies of [DATE] and show diffuse peribronchial thickening which is more prominent in the region of the right base and right hilus and the other areas of the lung.  The heart appears normal.  There again is seen some prominence of the right superior mediastinum.  Dialysis catheters are unchanged in position with one in the right atrium and the other near the junction of the right atrium and the superior vena cava.  There are again seen the surgical clips in the region of the right axilla.  
 Bony thorax appears normal except for minimal thoracolumbar scoliosis.
IMPRESSION: Diffuse peribronchial thickening more prominent in the region of the right base, right middle lobe where there may be an early superimposed acute inflammatory change on the chronic bronchitis.  Dialysis catheter in good position and unchanged.  Stable prominence right superior mediastinum.

## 2005-09-25 ENCOUNTER — Inpatient Hospital Stay (HOSPITAL_COMMUNITY): Admission: EM | Admit: 2005-09-25 | Discharge: 2005-10-01 | Payer: Self-pay | Admitting: Emergency Medicine

## 2005-09-25 IMAGING — CR DG CHEST 1V PORT
1 series · 1 of 1 positions shown · non-contrast
Comparison: [DATE].

CLINICAL DATA: Fever, cough, and vomiting.
 PORTABLE CHEST - 1 VIEW - [DATE] AT [OM] HOURS:

[view not recorded]
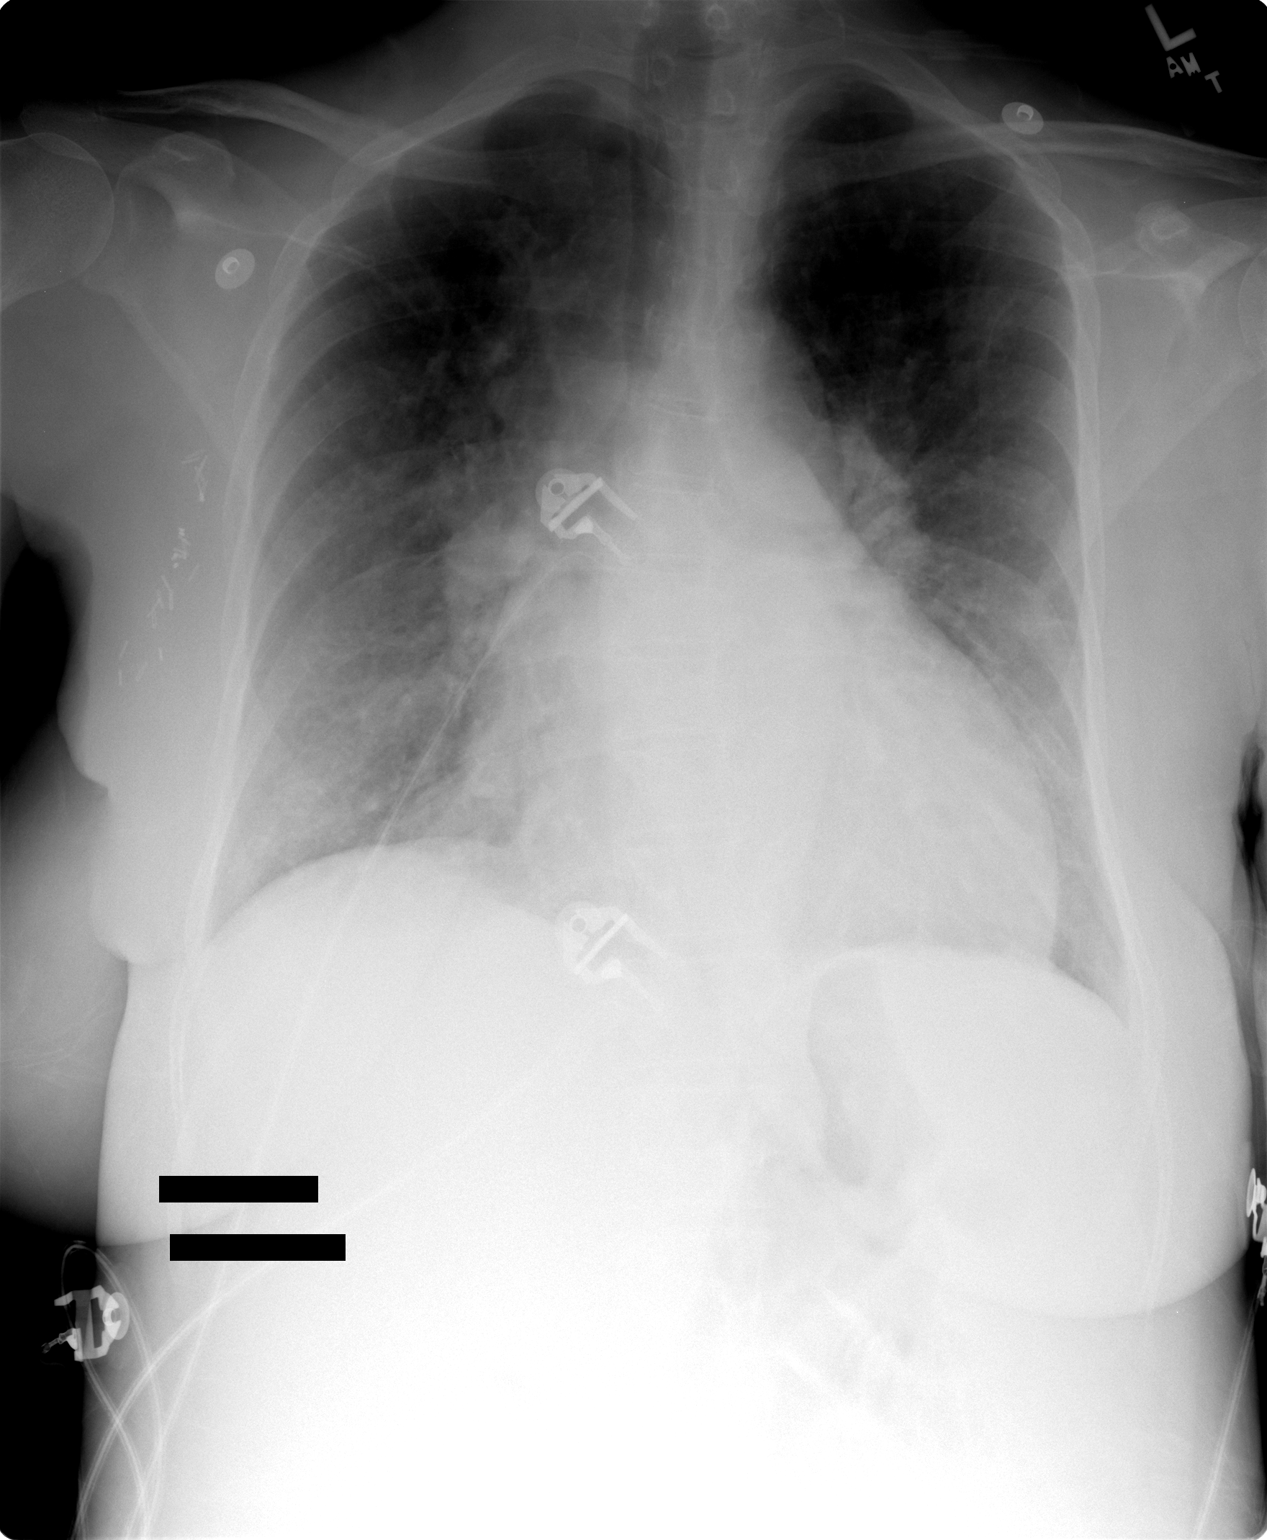

[1 of 1 positions shown; findings below may reference images not displayed]

FINDINGS: The heart is enlarged.  Pulmonary vascularity is congested.  Mild central edema is present.  There is increased density in the right paratracheal region.  There is also prominence in the right hilar region.  Mediastinal and/or hilar mass effect cannot be excluded.  No pneumothoraces or effusions are seen.
IMPRESSION: 1.  Cardiomegaly, vascular congestion and mild edema compatible with mild congestive heart failure.
 2.  Right paratracheal and right hilar soft tissue density.  Adenopathy is not excluded.  Upright two view chest is warranted.

## 2005-09-28 IMAGING — CR DG CHEST 2V
2 series · 2 of 2 positions shown · non-contrast
Comparison: none

HISTORY: Dyspnea, weakness, renal failure on dialysis, HIV positive, followup
right hilar nodes

[view not recorded (1 of 2)]
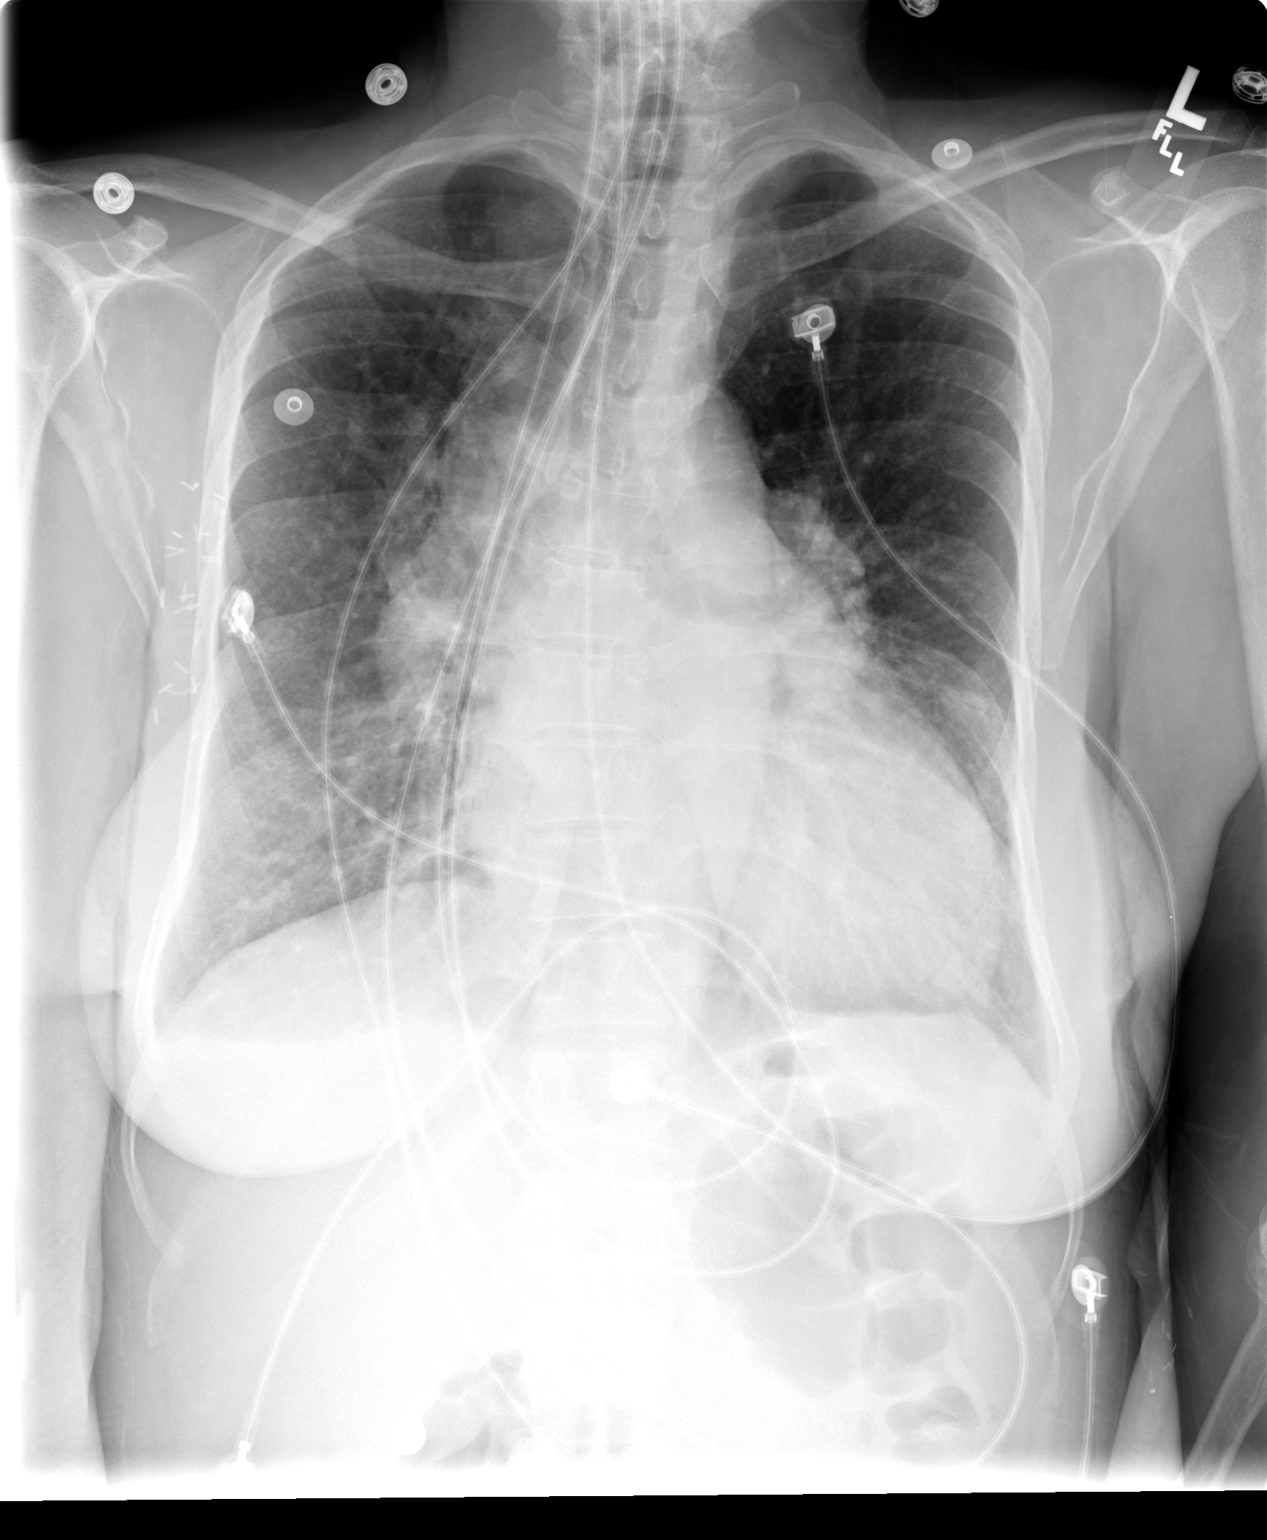

[view not recorded (2 of 2)]
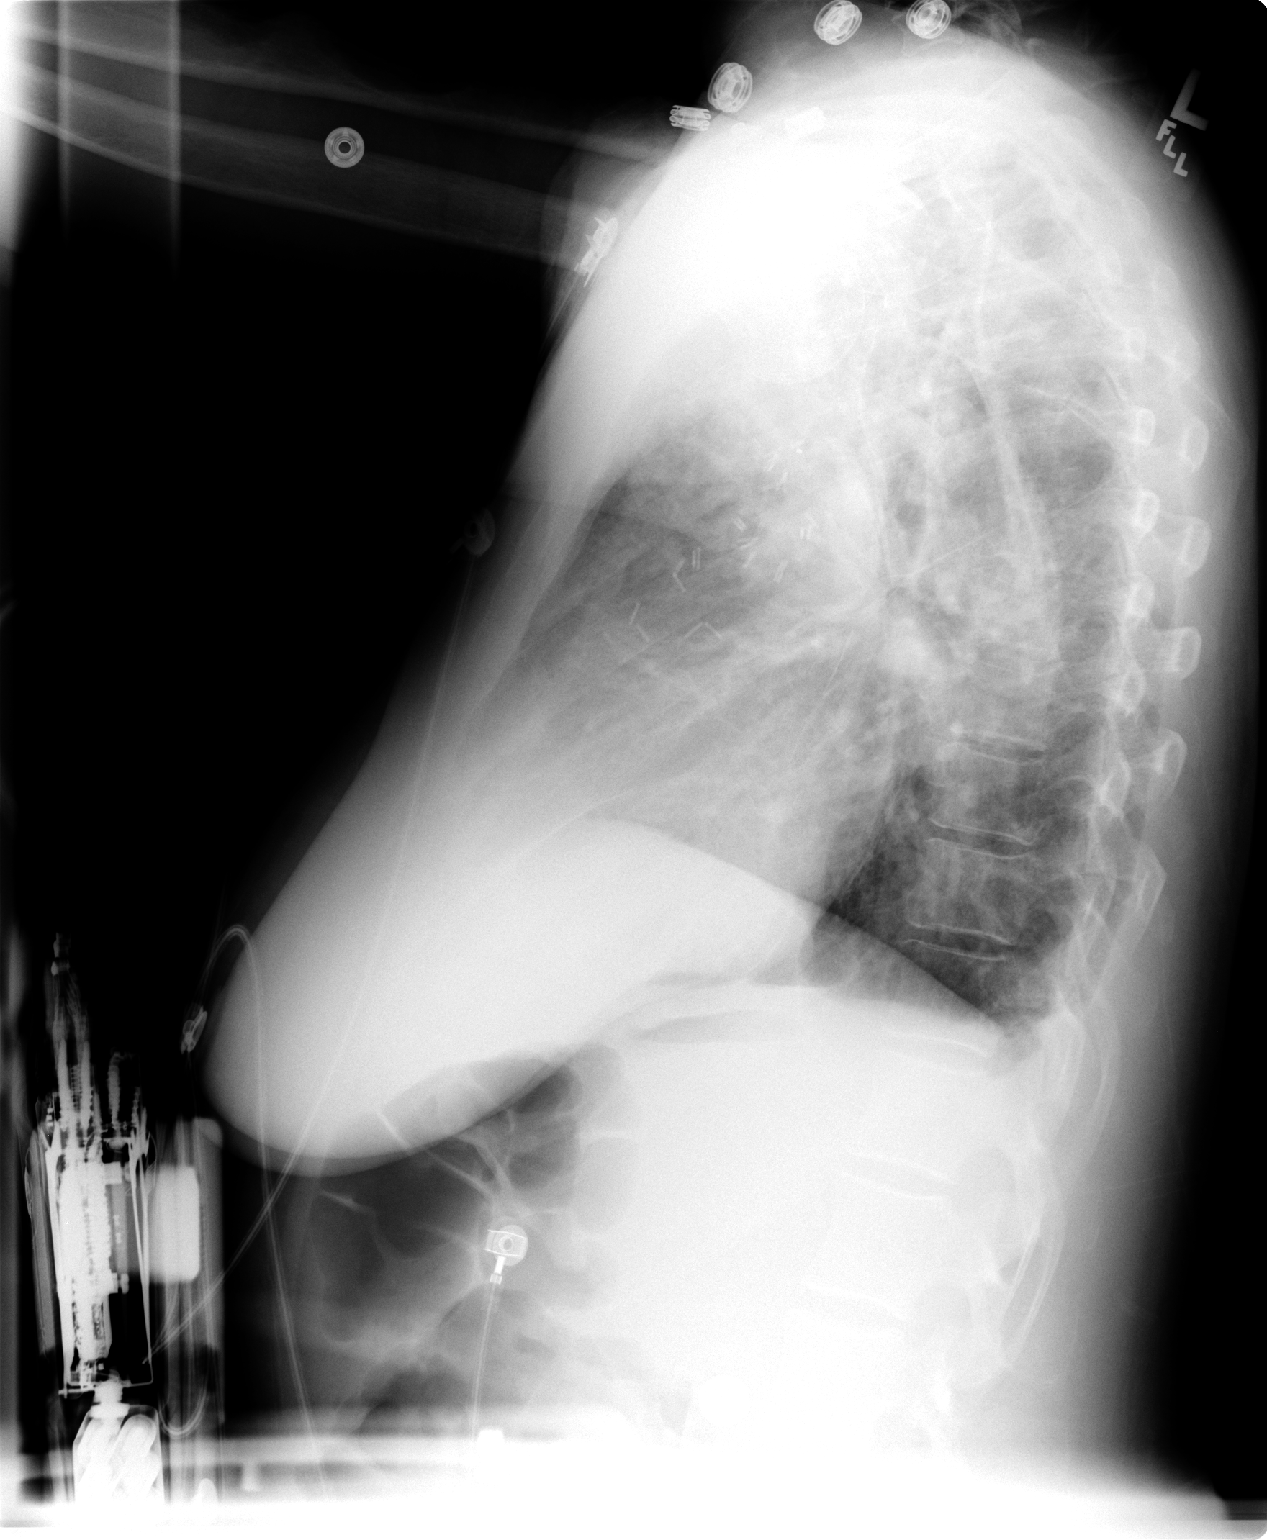

[2 of 2 positions shown; findings below may reference images not displayed]

CHEST 2 VIEWS:

Comparison [DATE]

Cardiac enlargement.
Mildly tortuous thoracic aorta.
Pulmonary vascularity normal.
Persistent bilateral hilar and right paratracheal soft tissue density likely
representing adenopathy.
Minimal right basilar atelectasis.
Surgical clips left axilla.
No pleural effusion, pulmonary infiltrate, or pneumothorax.
Numerous cardiac monitoring lines project over chest.
IMPRESSION: Cardiomegaly with minimal right basilar atelectasis.
Persistent abnormal soft tissue density bilateral hilar and right paratracheal
regions most likely representing adenopathy; recommend CT chest to assess
extent.

## 2005-09-29 IMAGING — CR DG ABDOMEN 2V
2 series · 2 of 2 positions shown · non-contrast
Comparison: none

HISTORY: Abdominal bloating and pain, acute renal failure, HIV positive

[view not recorded (1 of 2)]
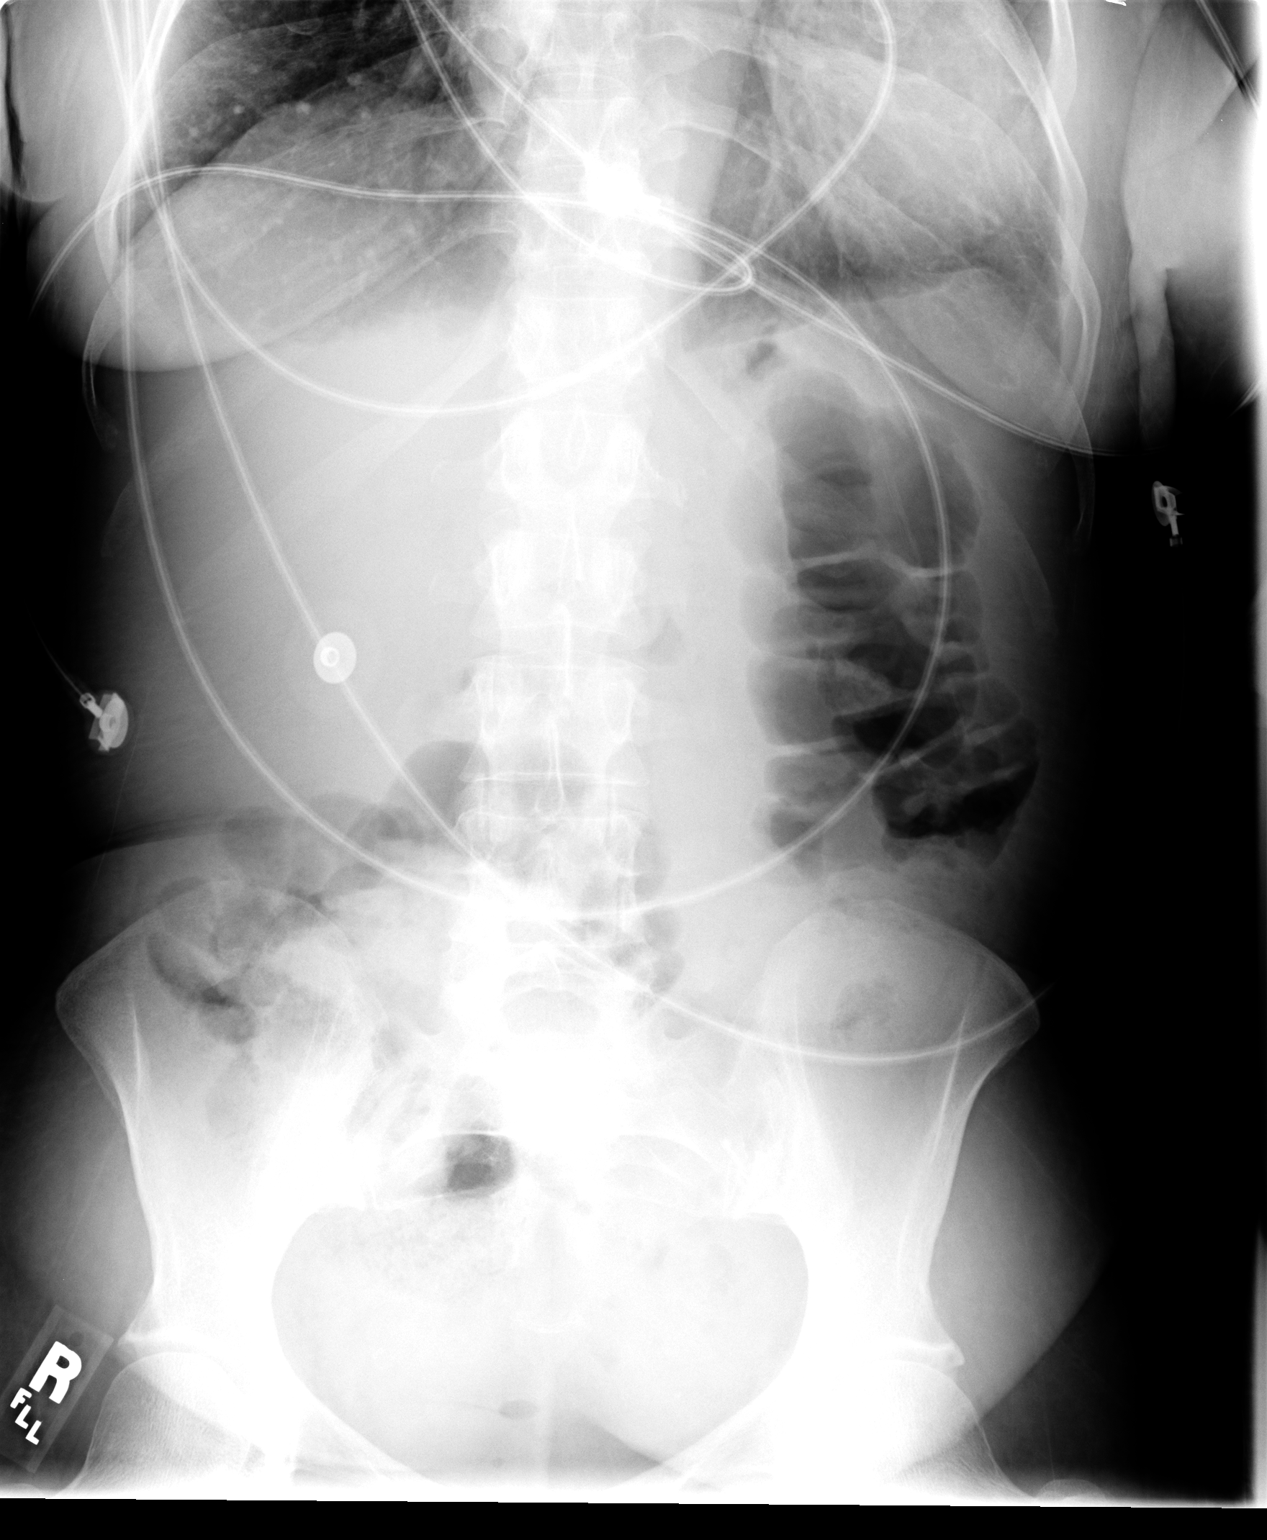

[view not recorded (2 of 2)]
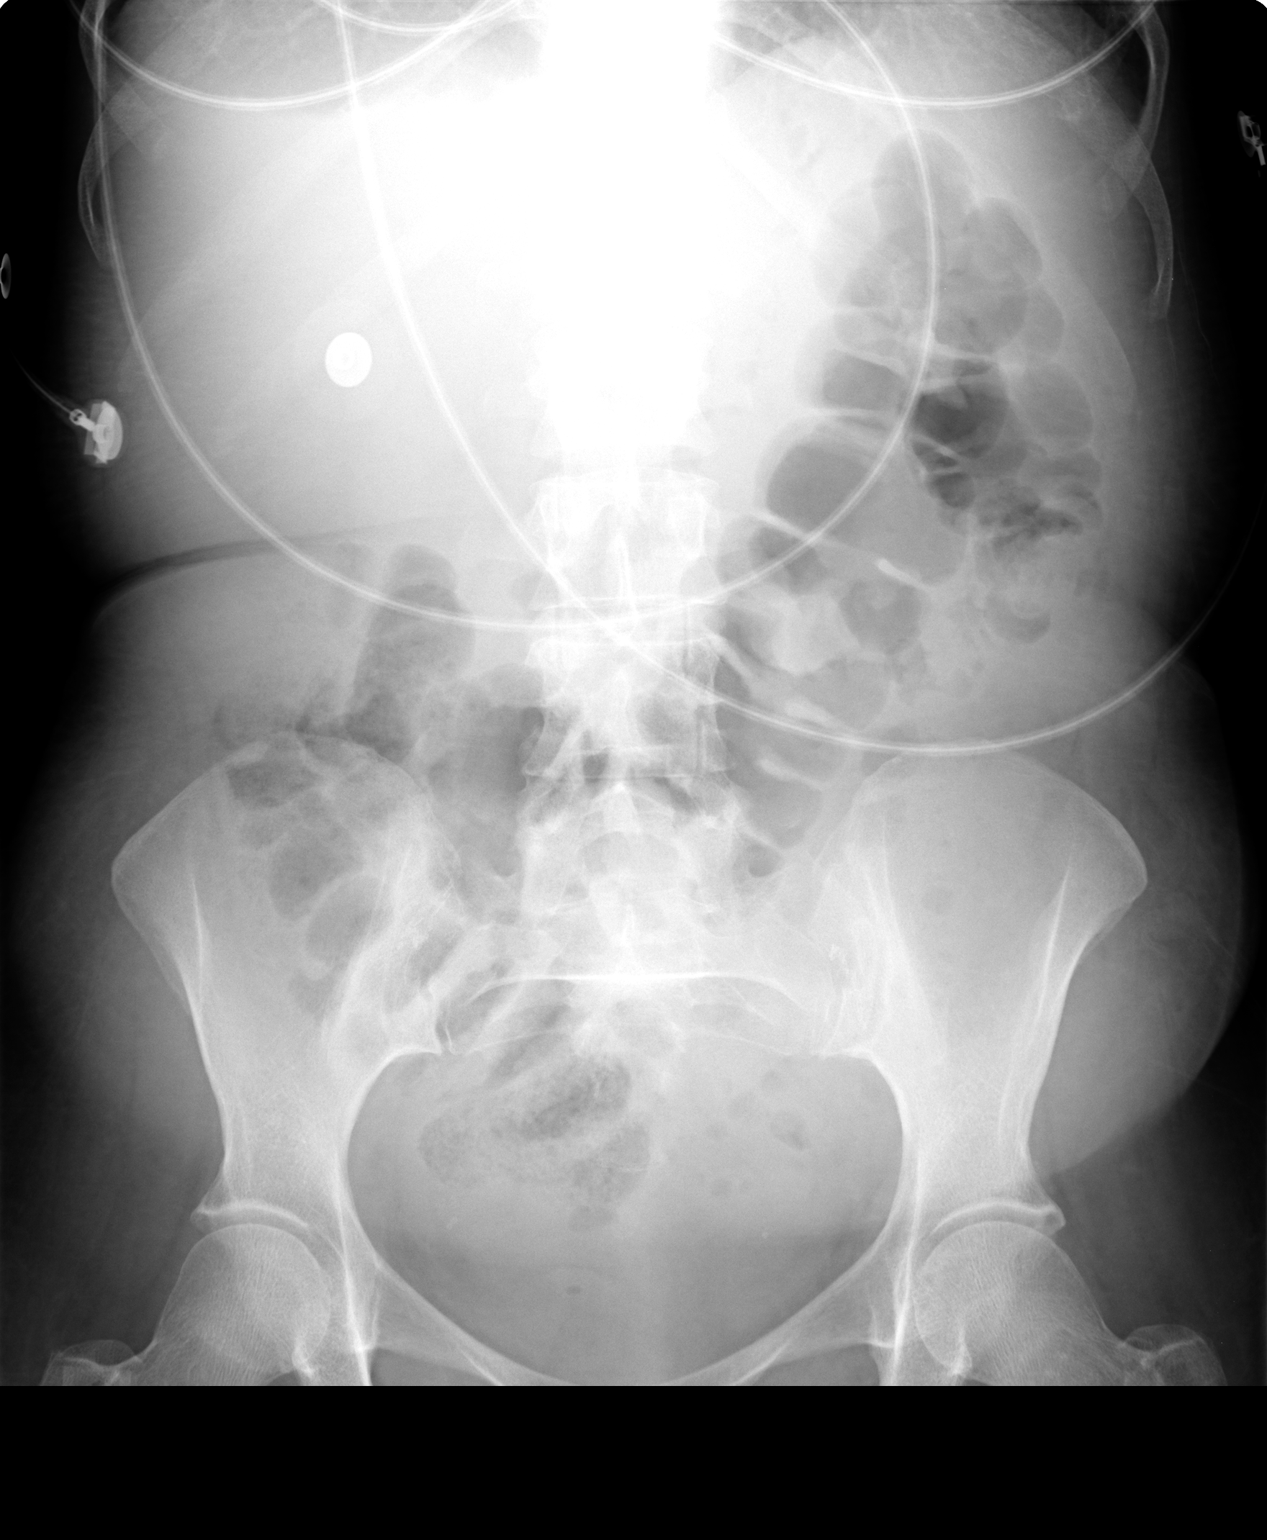

[2 of 2 positions shown; findings below may reference images not displayed]

ABDOMEN 2 VIEWS:

No prior study for comparison.

Nonspecific bowel gas pattern.
Proximal half of colon air filled and nondistended.
Small bowel gas pattern normal.
Small amount of stool in cecum.
No bowel wall thickening, signs of obstruction, or free air.
Bones unremarkable.
Heart appears enlarged.
Lung bases grossly clear.
Question pelvic phleboliths.
IMPRESSION: Nonspecific bowel gas pattern.

## 2005-09-30 ENCOUNTER — Ambulatory Visit: Payer: Self-pay | Admitting: Cardiology

## 2005-12-17 ENCOUNTER — Ambulatory Visit (HOSPITAL_COMMUNITY): Admission: RE | Admit: 2005-12-17 | Discharge: 2005-12-17 | Payer: Self-pay | Admitting: Internal Medicine

## 2005-12-17 IMAGING — CR DG CHEST 2V
2 series · 2 of 2 positions shown · non-contrast
Comparison: none

HISTORY: Bronchitis, cough, minimal hemoptysis, dyspnea

[view not recorded (1 of 2)]
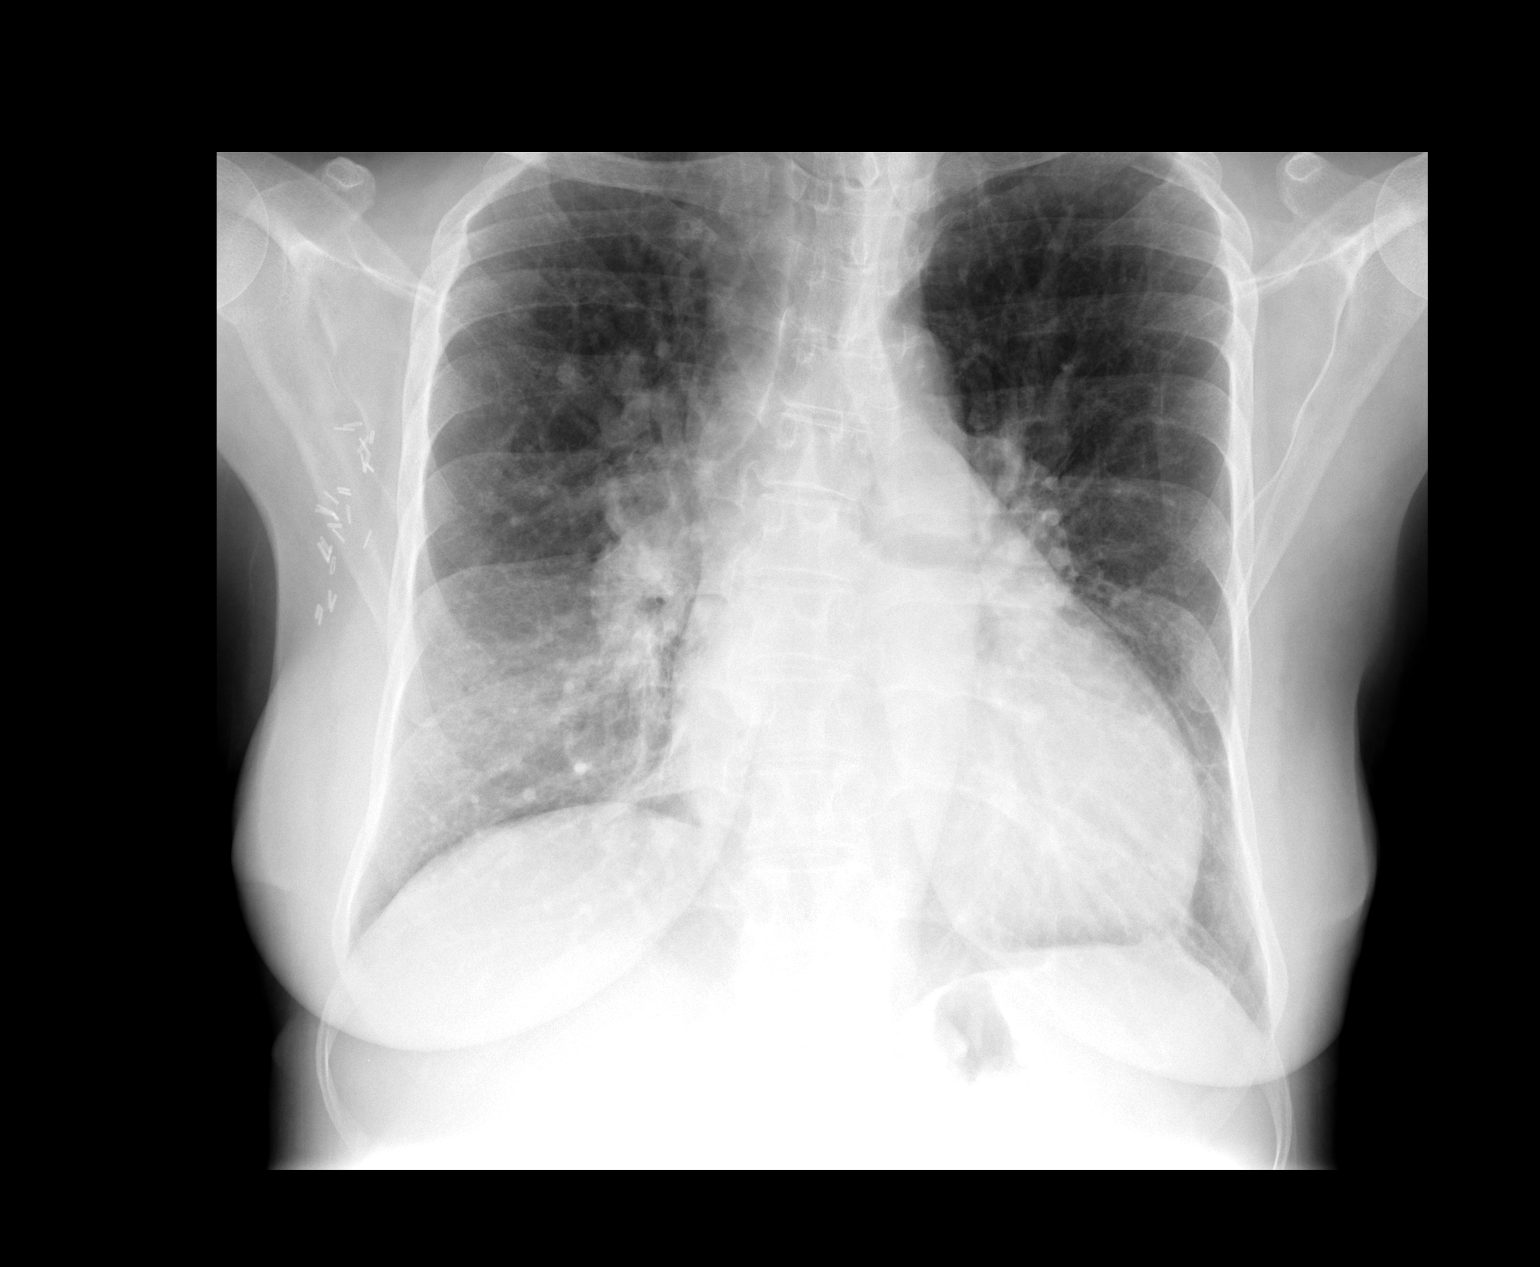

[view not recorded (2 of 2)]
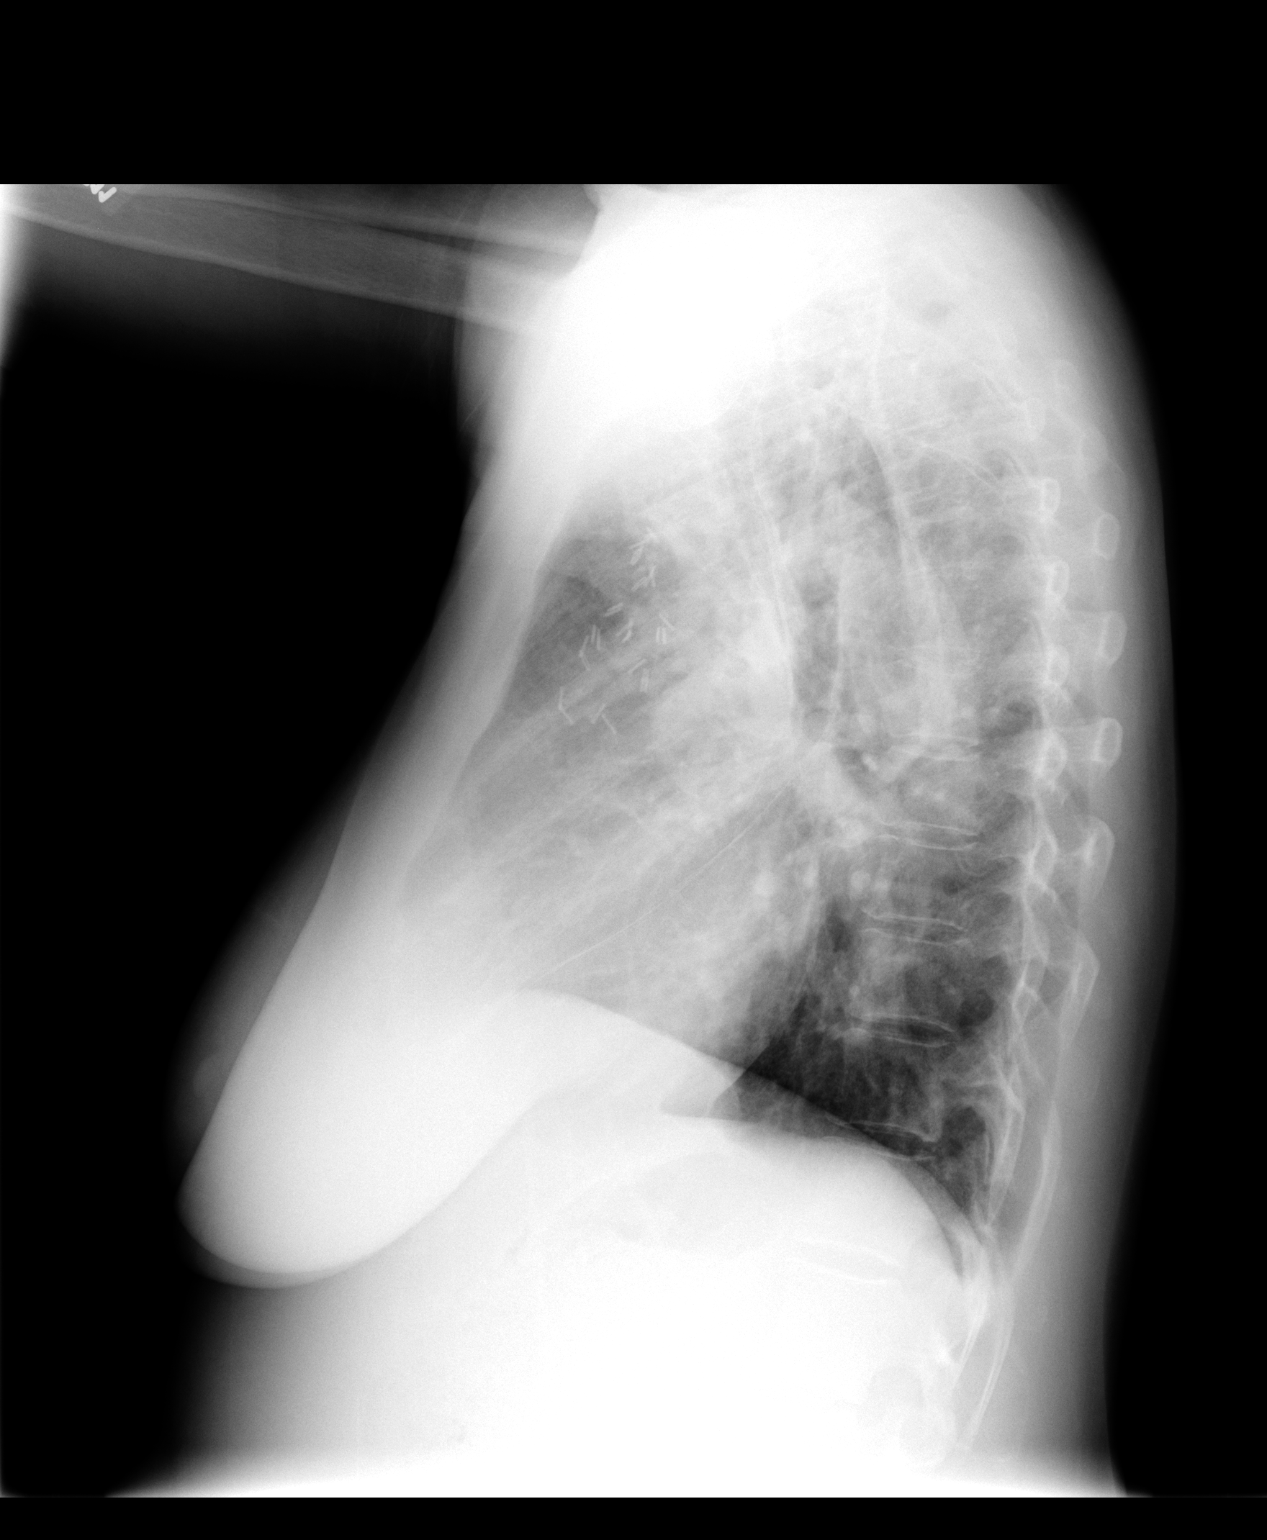

[2 of 2 positions shown; findings below may reference images not displayed]

CHEST 2 VIEWS:

Comparison [DATE]

Cardiac enlargement.
Mild elongation of aorta.
Bilateral hilar enlargement and pulmonary vascular congestion.
Chronic bronchitic changes.
No acute failure or consolidation.
Numerous surgical clips right axilla.
No definite pulmonary nodule, pneumothorax, or focal bone abnormality.
IMPRESSION: Cardiomegaly with pulmonary vascular congestion.
Persistent bilateral hilar enlargement, stable since [DATE] but increased
since [DATE], suspect hilar adenopathy or perihilar masses.
While little changed from most recent previous study, recommend CT chest with
contrast to assess (if this has not yet been performed).

## 2005-12-23 ENCOUNTER — Inpatient Hospital Stay (HOSPITAL_COMMUNITY): Admission: EM | Admit: 2005-12-23 | Discharge: 2005-12-26 | Payer: Self-pay | Admitting: Emergency Medicine

## 2005-12-23 ENCOUNTER — Ambulatory Visit (HOSPITAL_COMMUNITY): Admission: RE | Admit: 2005-12-23 | Discharge: 2005-12-23 | Payer: Self-pay | Admitting: Internal Medicine

## 2005-12-23 IMAGING — CT CT CHEST W/ CM
1 of 2 series · 14 of 32 positions shown, 18 images · IV contrast (CONTRAST)
Comparison: Comparison is made with [DATE] CT.

CLINICAL DATA: Prominent hila noted on CXR.  Recent bronchitis, chest pain, NAYELISSE, dialysis, HPP.  
CHEST CT WITH CONTRAST:
TECHNIQUE: Multidetector CT imaging of the chest was performed following the standard protocol during bolus administration of intravenous contrast.
Contrast:  100 cc Omnipaque 300

[Series 1636: — · axial · 0.55mm/px · z∈[+1620,+1890]mm · 14 of 64 slices shown, 18 images]
[im 5/64  mediastinal]
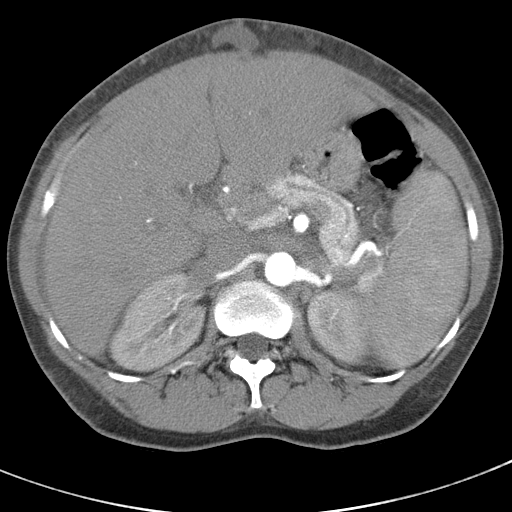
[im 5/64  lung]
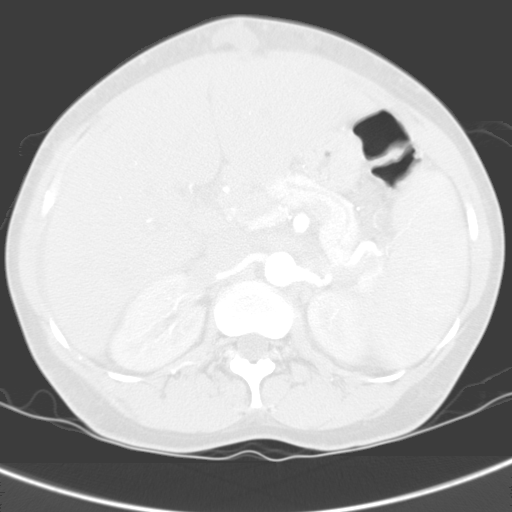
[im 10/64  lung]
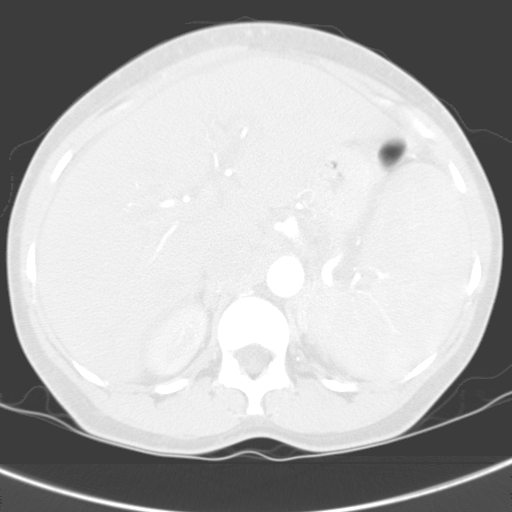
[im 15/64  lung]
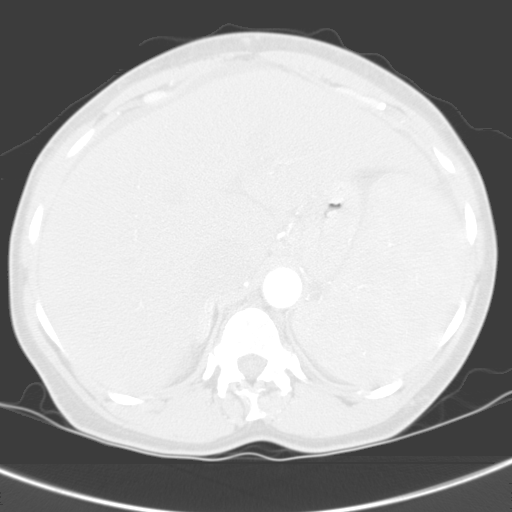
[im 20/64  lung]
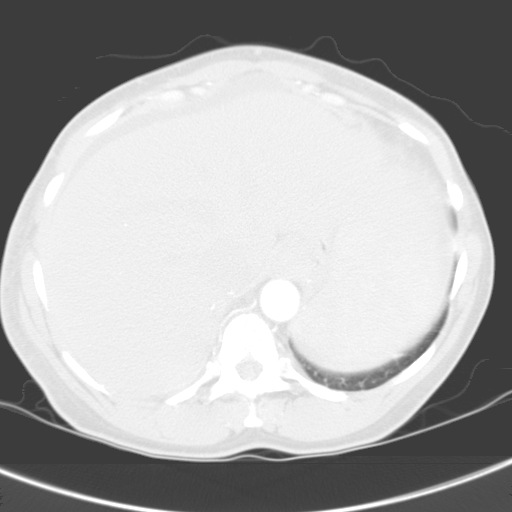
[im 25/64  mediastinal]
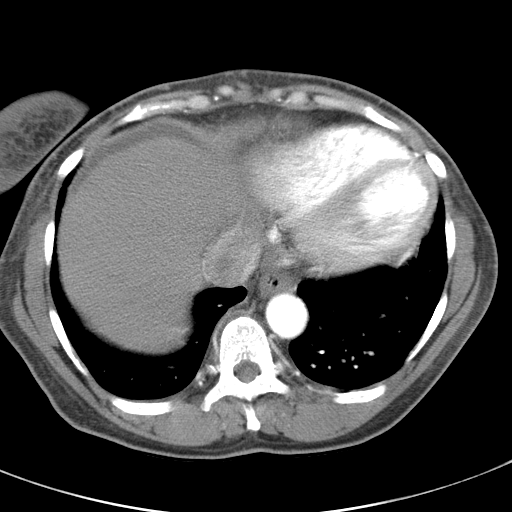
[im 25/64  lung]
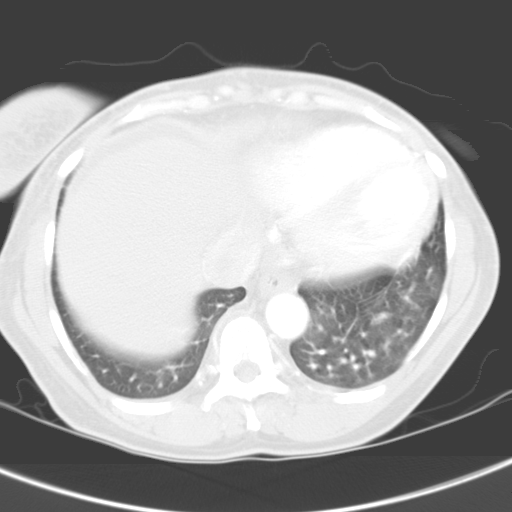
[im 30/64  lung]
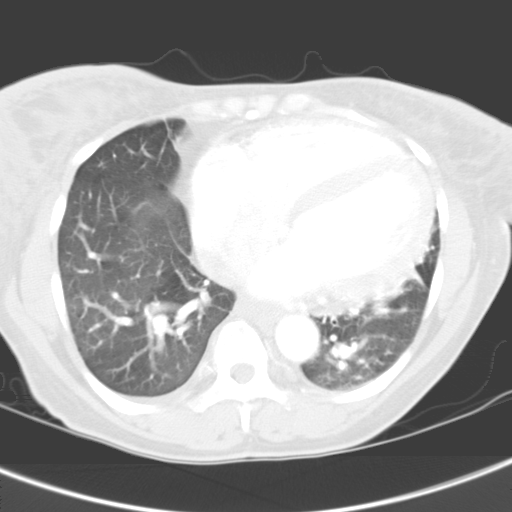
[im 31/64  lung]
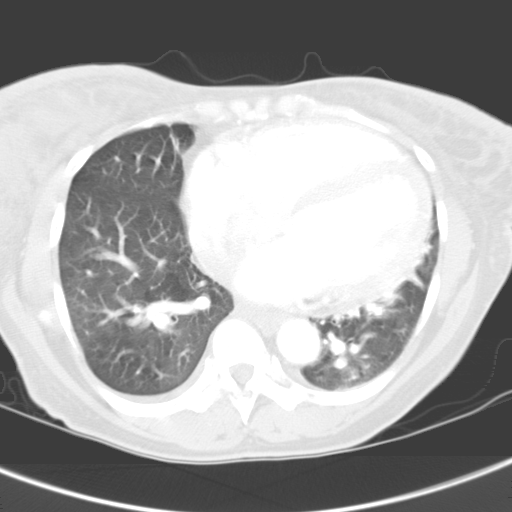
[im 32/64  lung]
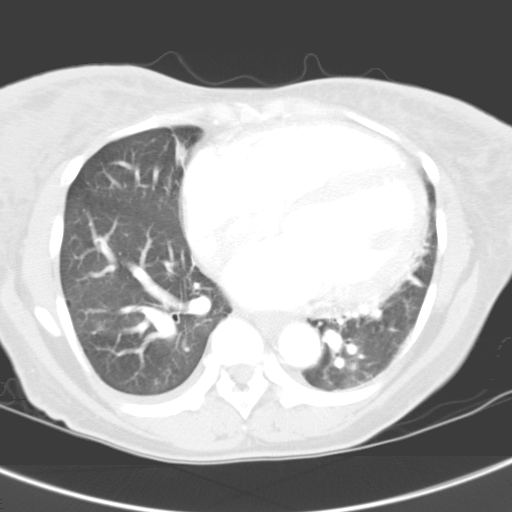
[im 34/64  mediastinal]
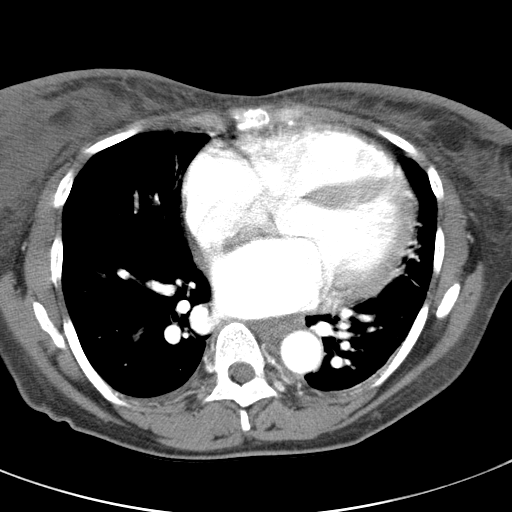
[im 34/64  lung]
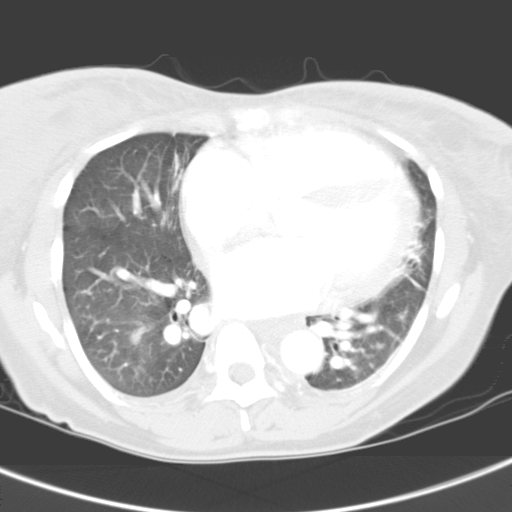
[im 39/64  lung]
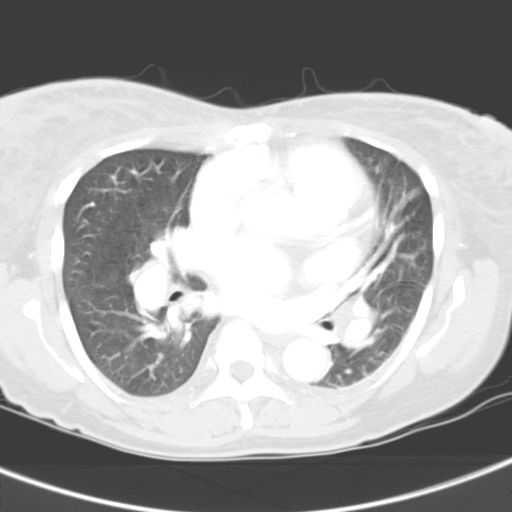
[im 44/64  lung]
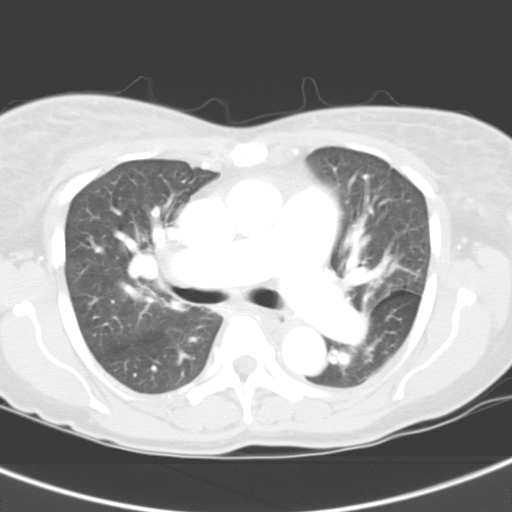
[im 49/64  lung]
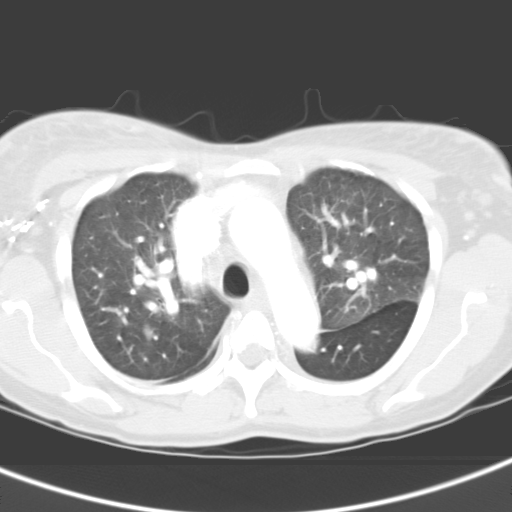
[im 54/64  mediastinal]
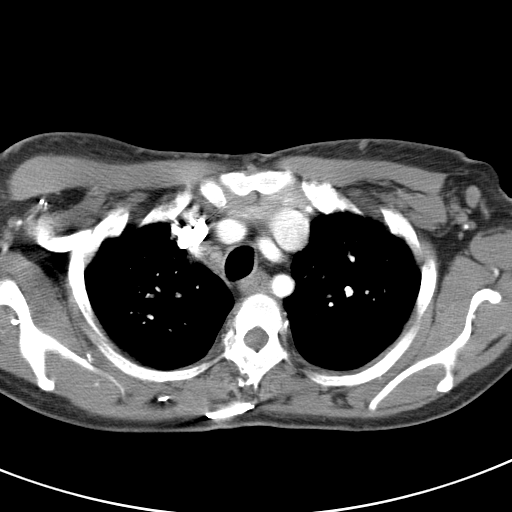
[im 54/64  lung]
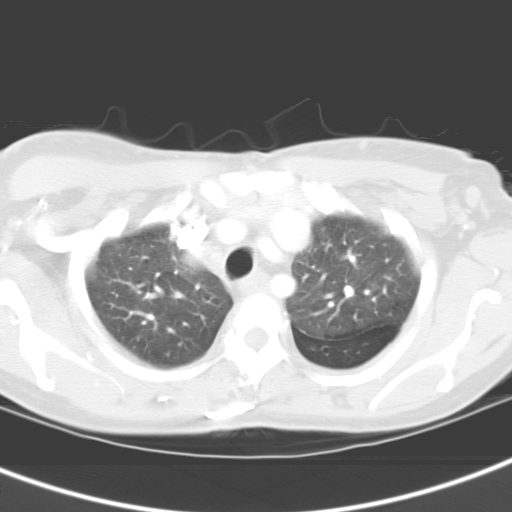
[im 59/64  lung]
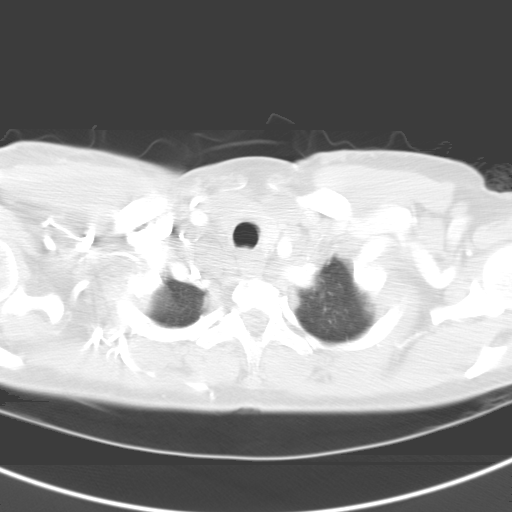

[14 of 32 positions shown; findings below may reference images not displayed]

FINDINGS: Enlargement of the main and central pulmonary arteries increased in degree when compared to [DATE] CT.    Main pulmonary artery segment measures 3.3 cm in maximum transverse dimension compared to approximately 2.5 cm on [DATE].   Bilateral hilar adenopathy contributes to hilar enlargement as well.  Bi-axillary adenopathy. Right axillary metallic clips Slightly prominent sized right lower paratracheal nodes, measuring up to approximately 7 mm in diameter.  Also slightly prominent in size, but not pathologically enlarged, aorticopulmonary window nodes.  There is a right posterior hilar node with a short axis diameter of 1.1 cm.  There is a 1.0 cm short axis diameter node in the left inferior hilum.  Cardiomegaly with multi-chamber enlargement.  Minimal diffuse ground-glass opacification of the lungs represents a nonspecific finding, but may reflect mild pulmonary edema.  Mild ascites.  Hepatosplenomegaly. Prominent caliber IVC and hepatic veins may reflect elevated right heart pressures.
IMPRESSION: Increased bilateral hilar enlargement when compared to [DATE] CT.  The findings are compatible with pulmonary arterial hypertension with enlarged pulmonary arteries and bihilar adenopathy.  The etiology for the adenopathy is uncertain. Findings compatible with elevated right heart pressures, i.e., hepatosplenomegaly, mild ascites, and dilated IVC and hepatic veins.  Mild diffuse ground-glass attenuation of the lungs, a nonspecific finding, but likely reflecting mild pulmonary edema. Mild prominence of the pleural fissures is noted as well.   adenopathy is noted.

## 2006-02-09 ENCOUNTER — Ambulatory Visit (HOSPITAL_COMMUNITY): Admission: RE | Admit: 2006-02-09 | Discharge: 2006-02-09 | Payer: Self-pay | Admitting: Internal Medicine

## 2006-02-20 ENCOUNTER — Emergency Department (HOSPITAL_COMMUNITY): Admission: EM | Admit: 2006-02-20 | Discharge: 2006-02-20 | Payer: Self-pay | Admitting: Emergency Medicine

## 2006-02-20 IMAGING — CR DG CHEST 2V
2 series · 2 of 2 positions shown · non-contrast
Comparison: Two view chest x-ray [DATE] and CT chest [DATE].

CLINICAL DATA: Fever. Nausea and vomiting. Headache. Shortness of breath.

CHEST - 2 VIEW  [DATE]:

[view not recorded (1 of 2)]
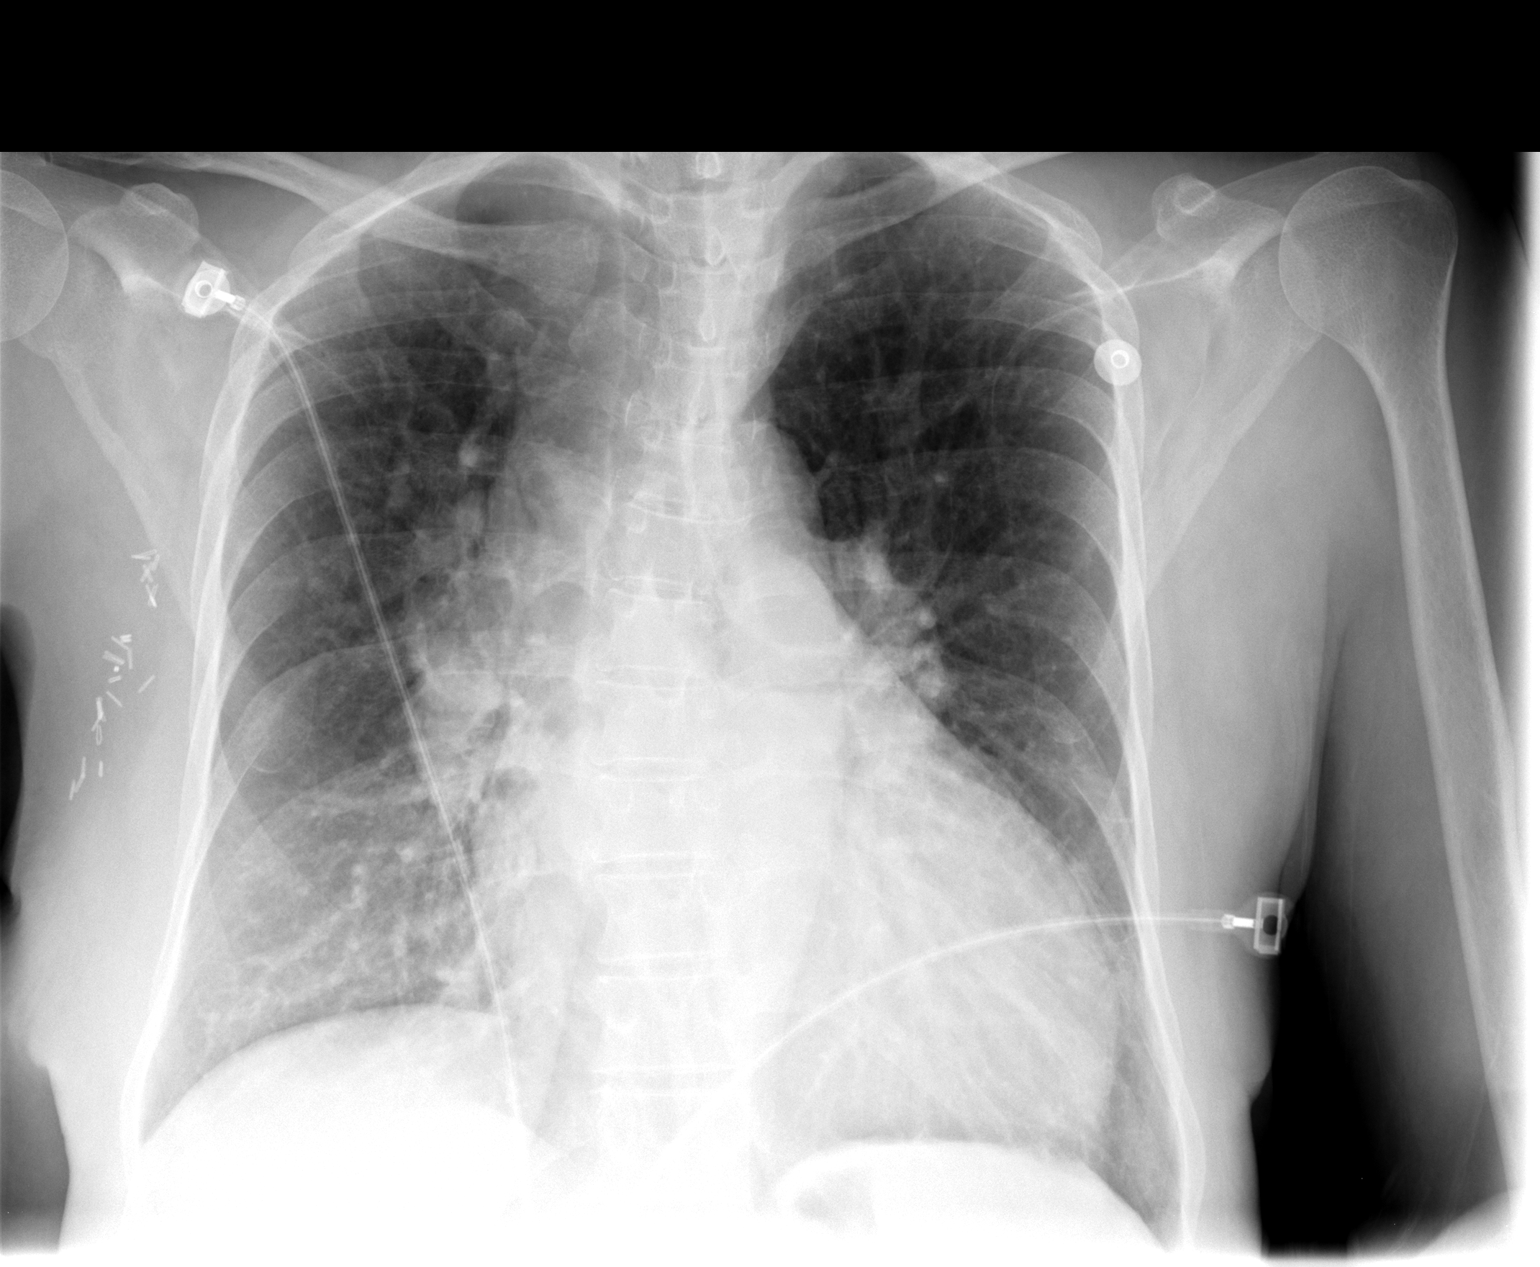

[view not recorded (2 of 2)]
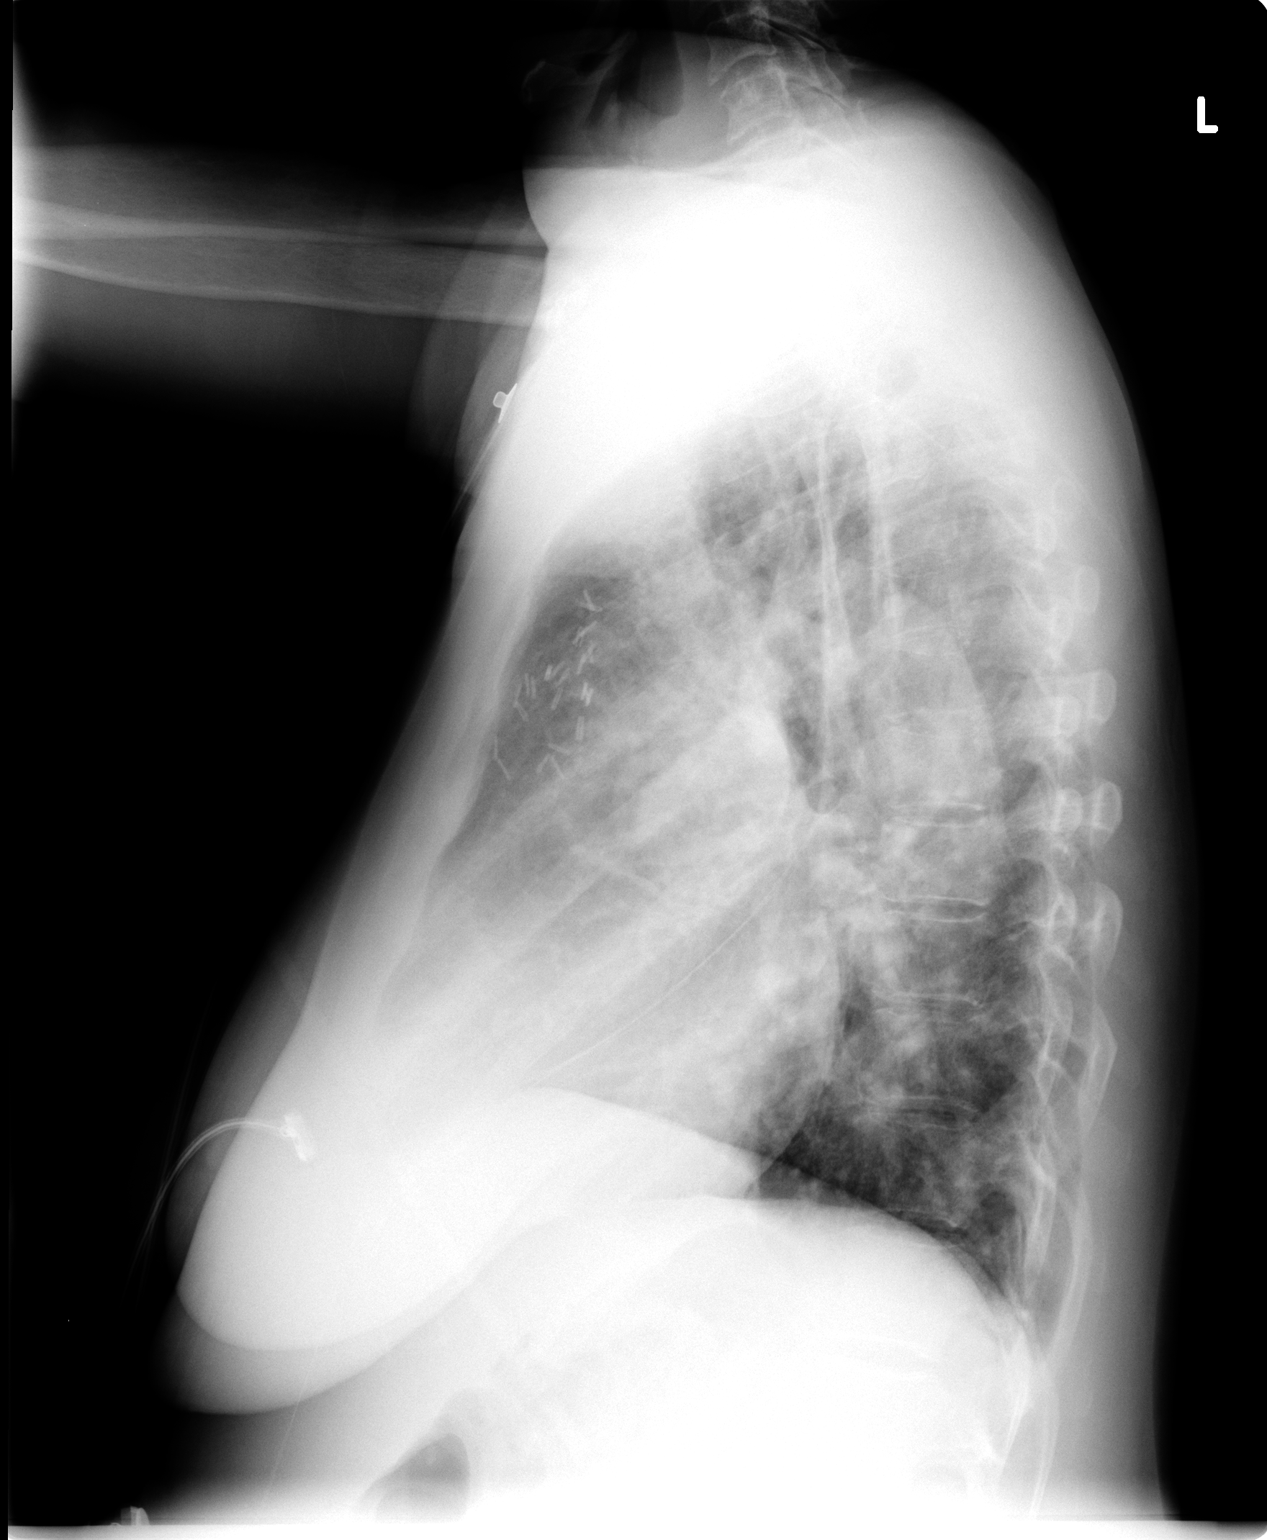

[2 of 2 positions shown; findings below may reference images not displayed]

FINDINGS: The heart is markedly enlarged but stable. Diffuse interstitial
pulmonary edema is present. There are no confluent areas of airspace
consolidation. There are no significant pleural effusions. The enlarged central
pulmonary arteries are again demonstrated and are unchanged. Surgical clips in
the right axilla from prior node dissection again noted. The visualized bony
thorax appears intact.
IMPRESSION: Stable marked cardiomegaly. Mild interstitial pulmonary edema.

## 2006-02-21 ENCOUNTER — Inpatient Hospital Stay (HOSPITAL_COMMUNITY): Admission: EM | Admit: 2006-02-21 | Discharge: 2006-02-26 | Payer: Self-pay | Admitting: Emergency Medicine

## 2006-02-21 ENCOUNTER — Ambulatory Visit: Payer: Self-pay | Admitting: Cardiology

## 2006-02-21 IMAGING — CT CT HEAD W/O CM
1 of 2 series · 13 of 30 positions shown, 17 images · IV contrast (agent unspecified)
Comparison: None.

CLINICAL DATA: Severe headache.  Fever.  Shortness of breath.  HIV+.
 HEAD CT WITHOUT CONTRAST:
TECHNIQUE: Contiguous axial CT images were obtained from the base of the skull through the vertex according to standard protocol without contrast.

[Series 2: headseq 4.8 h40s · axial · 0.43mm/px · z∈[+91,+209]mm · 13 of 30 slices shown, 17 images]
[im 3/30  brain]
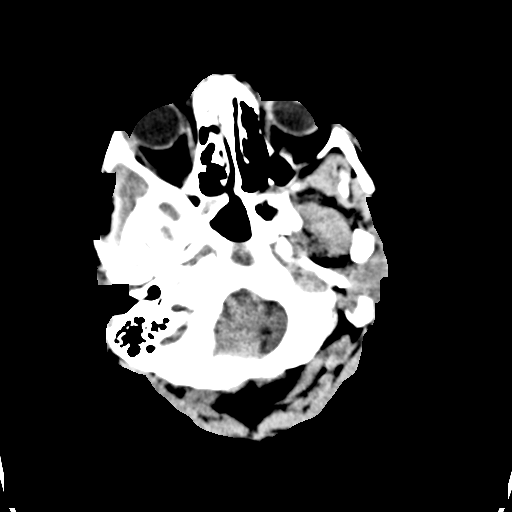
[im 3/30  bone]
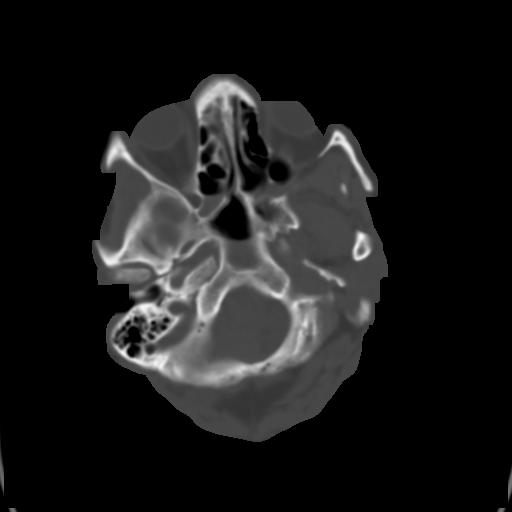
[im 5/30  brain]
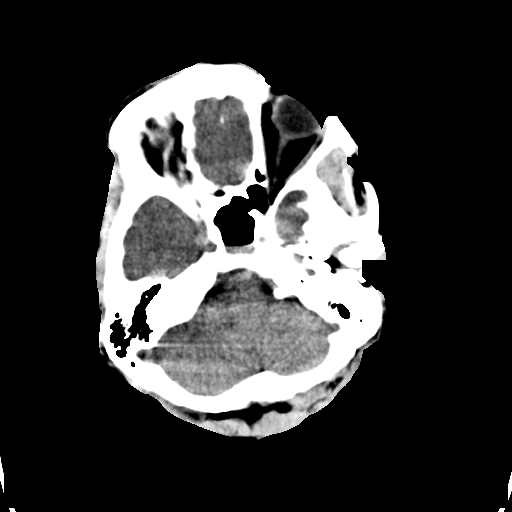
[im 7/30  brain]
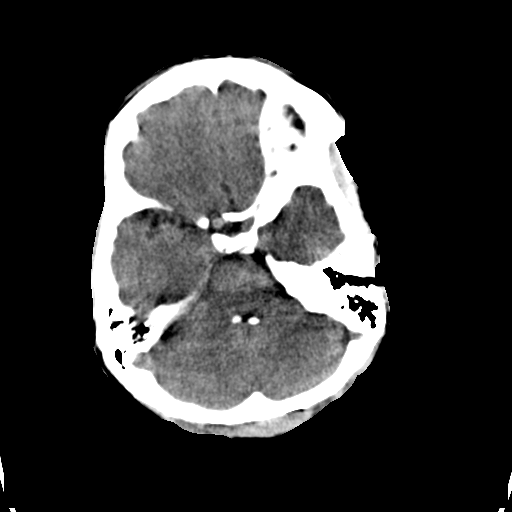
[im 9/30  brain]
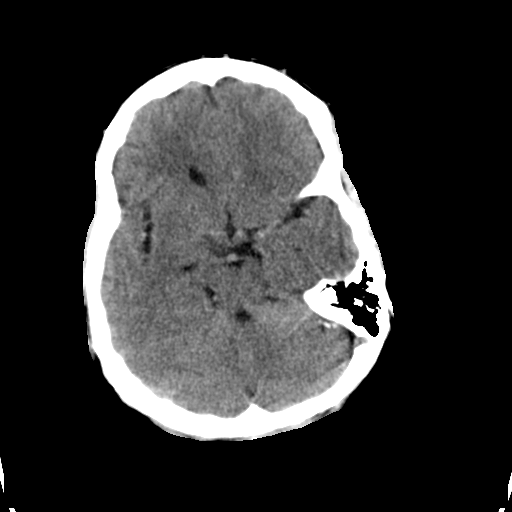
[im 11/30  brain]
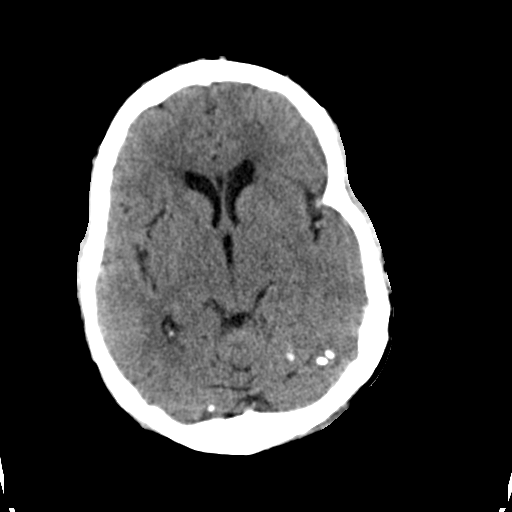
[im 11/30  bone]
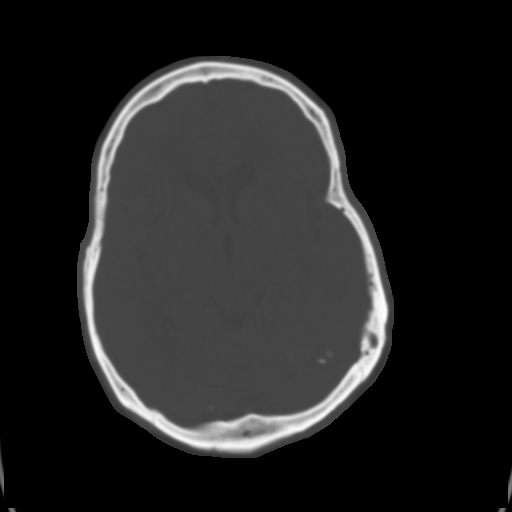
[im 13/30  brain]
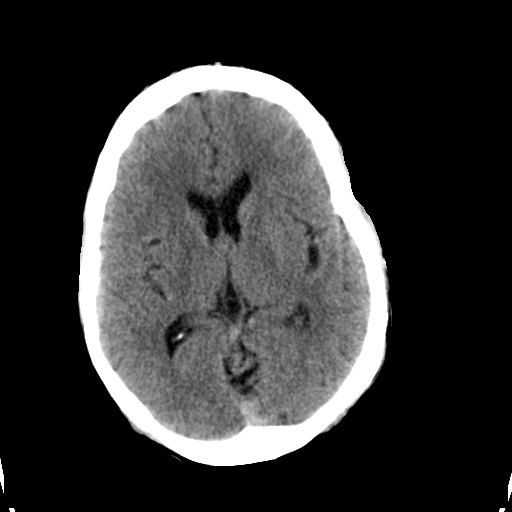
[im 15/30  brain]
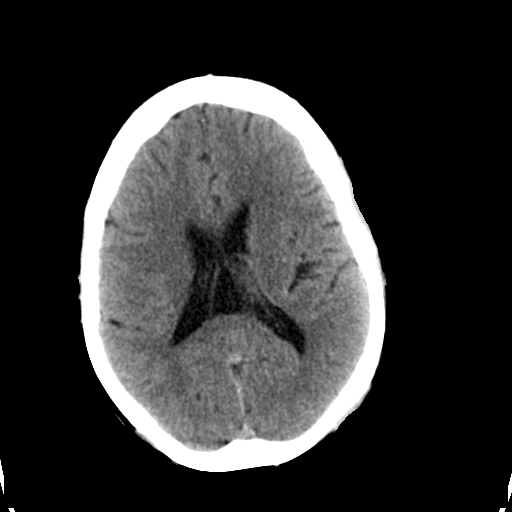
[im 17/30  brain]
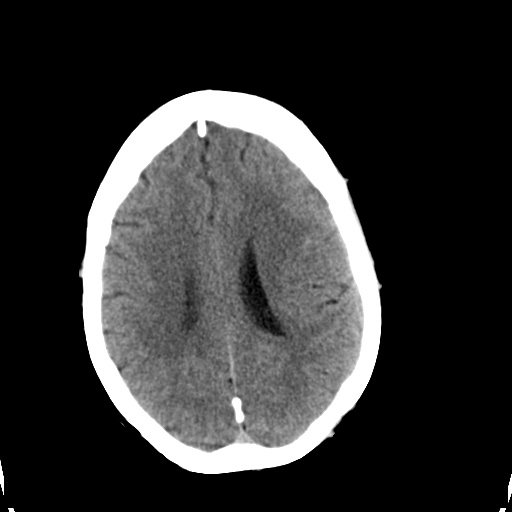
[im 19/30  brain]
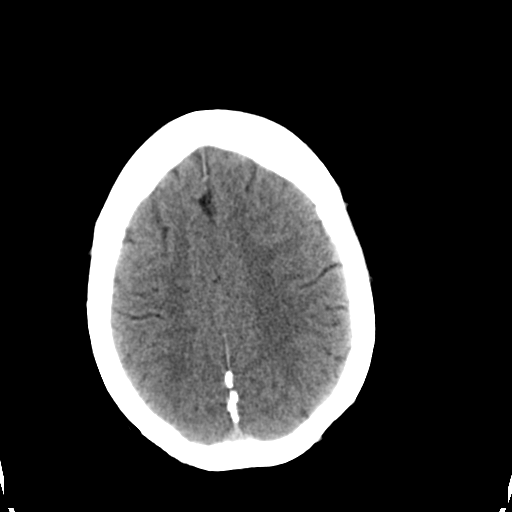
[im 19/30  bone]
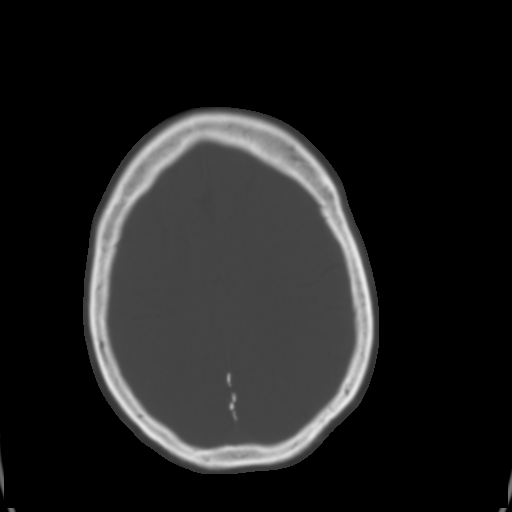
[im 21/30  brain]
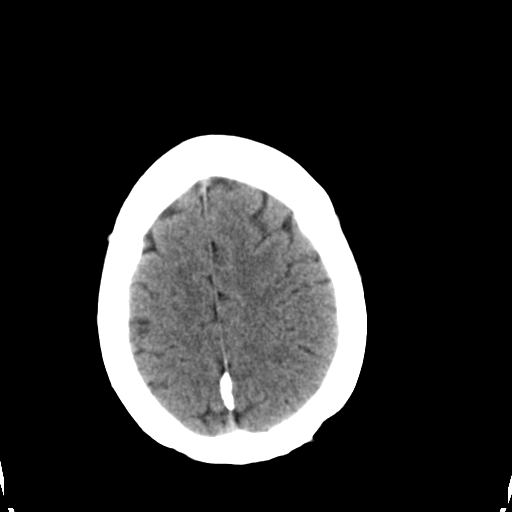
[im 23/30  brain]
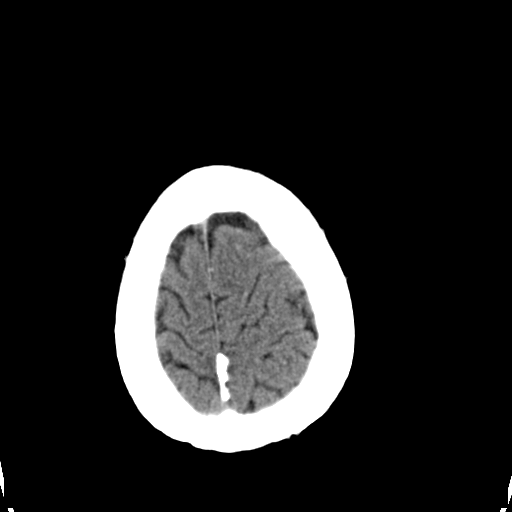
[im 25/30  brain]
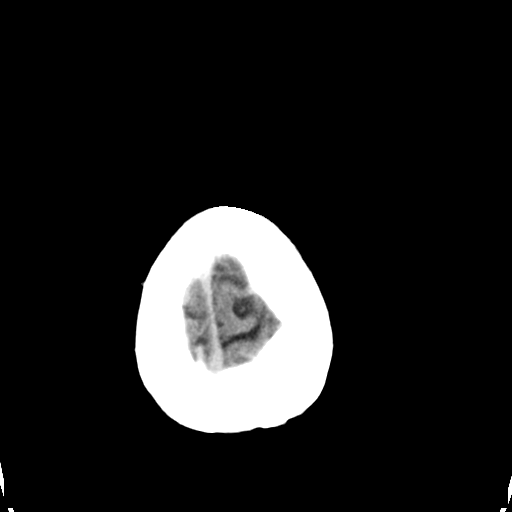
[im 27/30  brain]
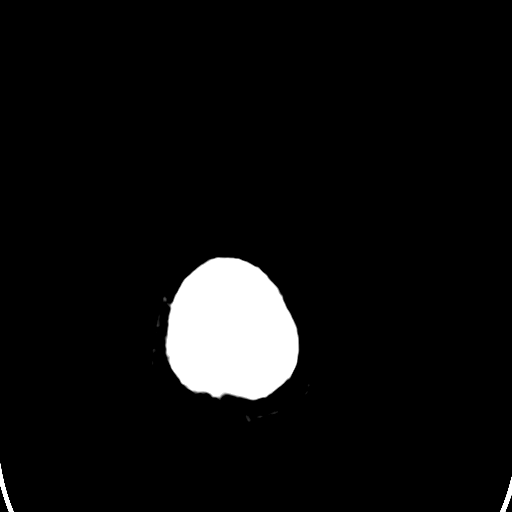
[im 27/30  bone]
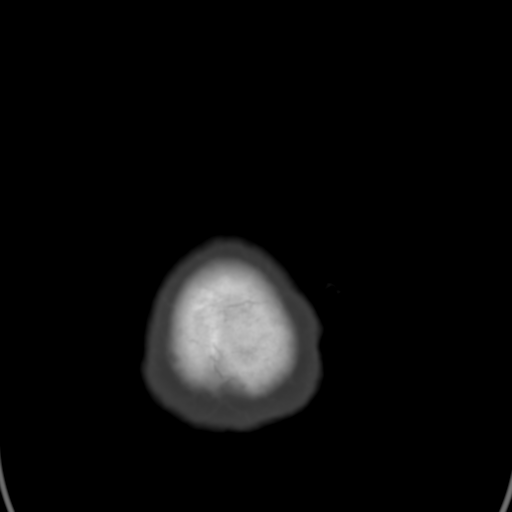

[13 of 30 positions shown; findings below may reference images not displayed]

FINDINGS: There is no evidence of intracranial hemorrhage, brain edema, or other signs of acute infarct.  There is no evidence of intracranial mass or mass effect.  No extra-axial abnormalities are seen.  The ventricles are normal in size.  
 Air-fluid levels are noted within the sphenoid and right maxillary sinuses, consistent with acute sinusitis.
IMPRESSION: 1.  No intracranial abnormality.
 2.  Acute right maxillary and sphenoid sinusitis.

## 2006-02-21 IMAGING — CR DG CHEST 1V PORT
1 series · 1 of 1 positions shown · non-contrast
Comparison: [DATE] and [DATE].

CLINICAL DATA: Shortness of breath.  Fever.  HIV+.
 PORTABLE CHEST - 1 VIEW ? [DATE]:

[view not recorded]
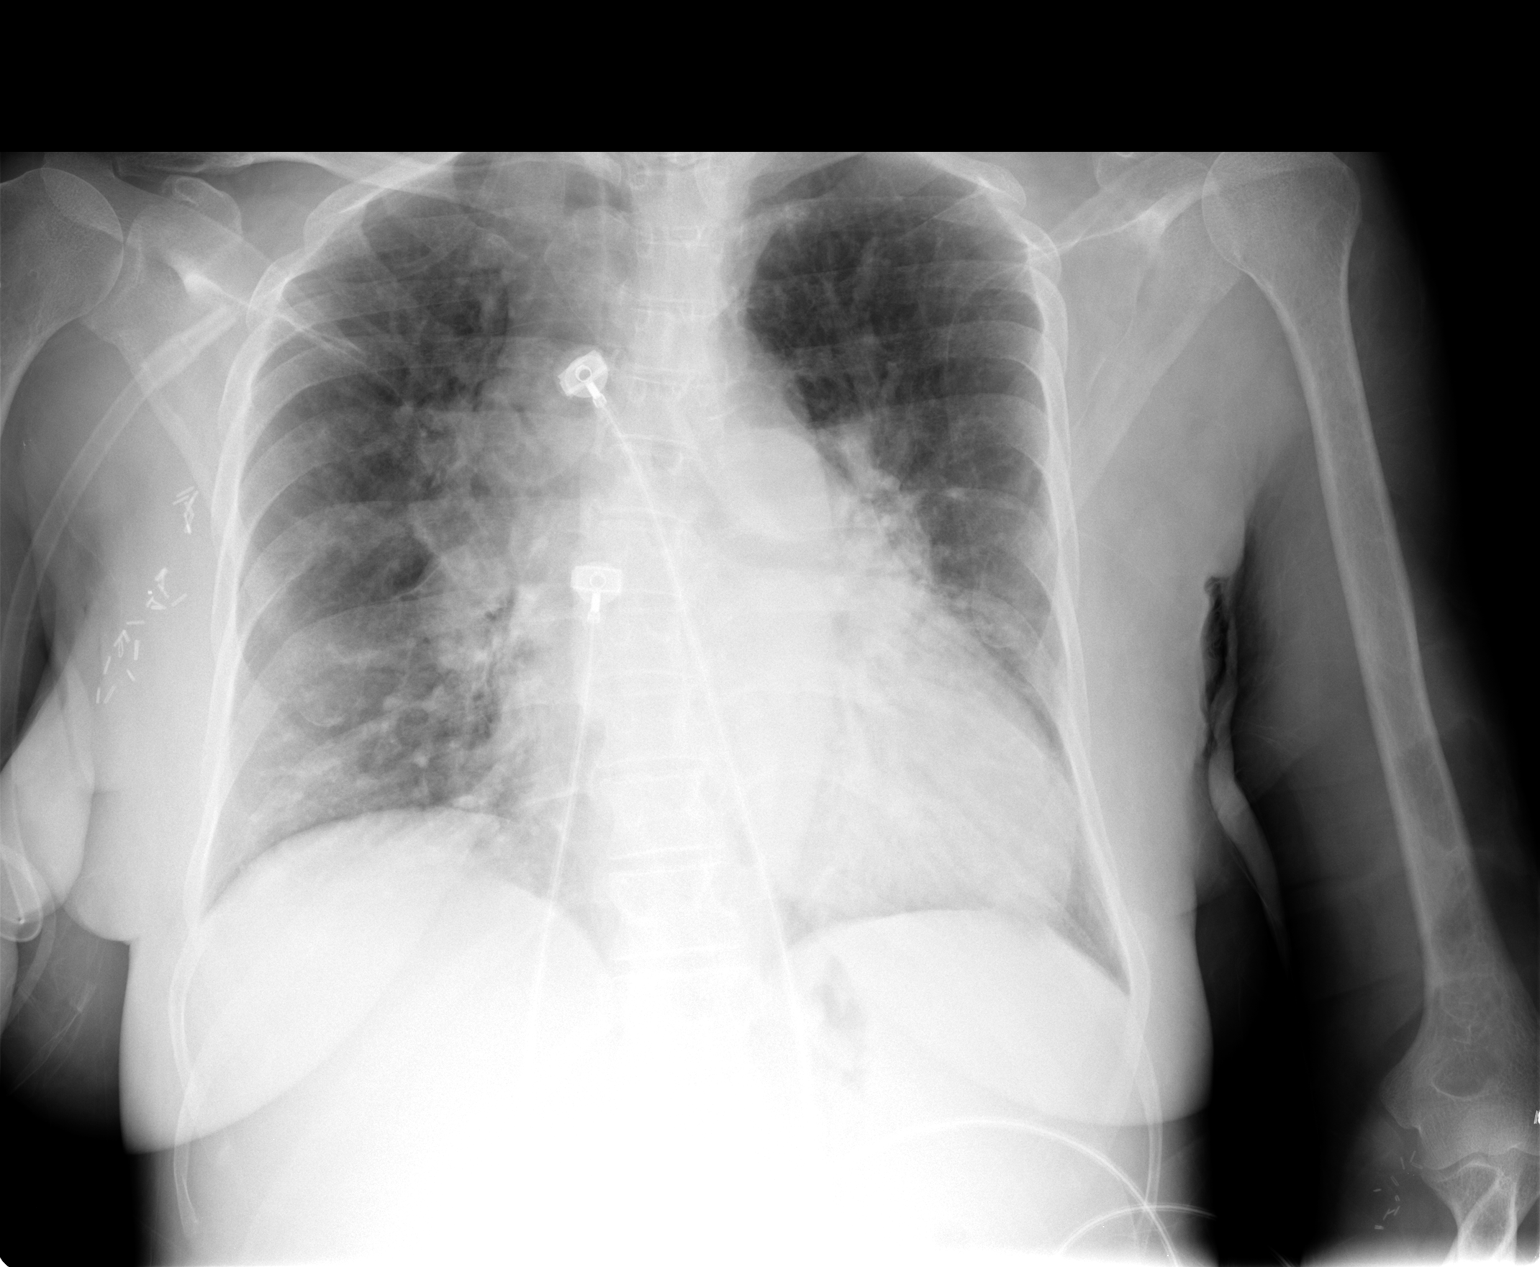

[1 of 1 positions shown; findings below may reference images not displayed]

FINDINGS: Cardiomegaly is stable.  Enlarged pulmonary arteries are again seen consistent with pulmonary artery hypertension.  There is no evidence of acute infiltrate or pleural effusion.
IMPRESSION: Stable cardiomegaly and large pulmonary arteries consistent with pulmonary artery hypertension.  No acute findings.

## 2006-02-22 IMAGING — CT CT CHEST W/ CM
2 of 3 series · 15 of 36 positions shown, 18 images · IV contrast (APPLIED)
Comparison: CT scan of [DATE].

CLINICAL DATA: Fever, leukocytosis.  Question ? Pneumocystis pneumonia? 
CHEST CT WITH CONTRAST:
TECHNIQUE: Multidetector CT imaging of the chest was performed following the standard protocol during bolus administration of intravenous contrast.
Contrast:  80 cc Omnipaque 300.

[Series 2: chestroutine 5.0 b40f · axial · 0.54mm/px · z∈[-325,-55]mm · 12 of 64 slices shown, 15 images]
[im 5/64  mediastinal]
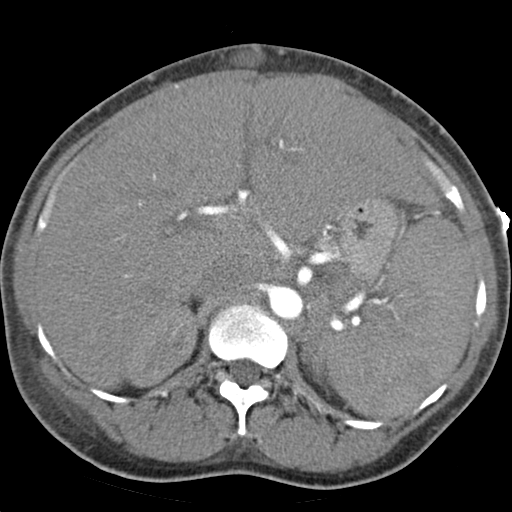
[im 5/64  lung]
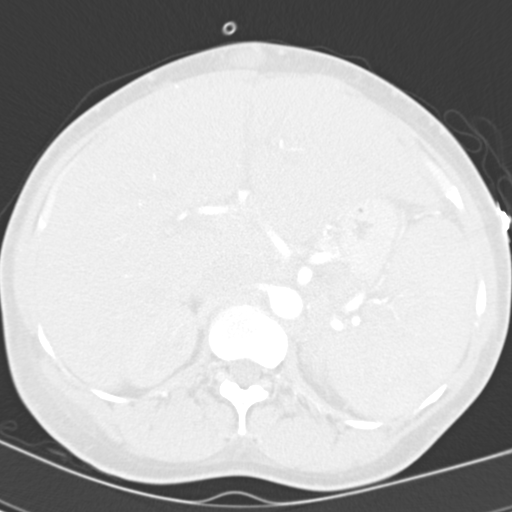
[im 10/64  lung]
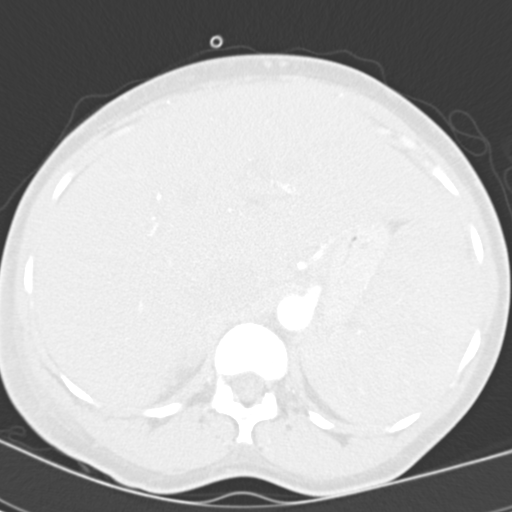
[im 15/64  lung]
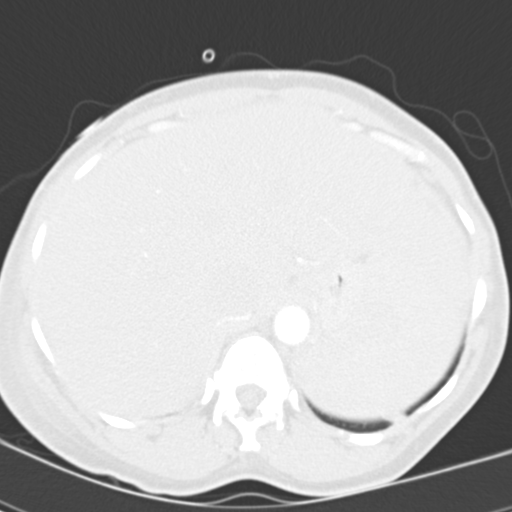
[im 19/64  lung]
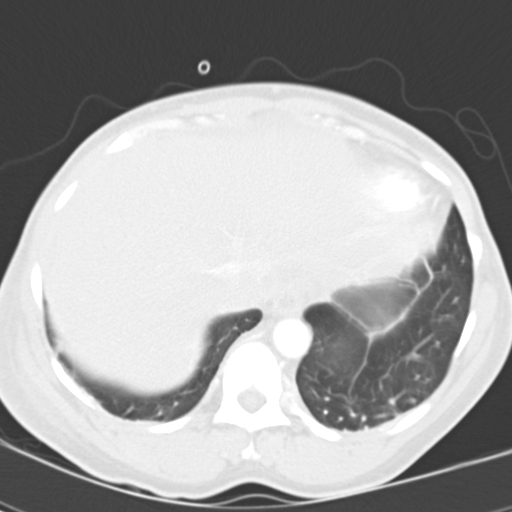
[im 24/64  mediastinal]
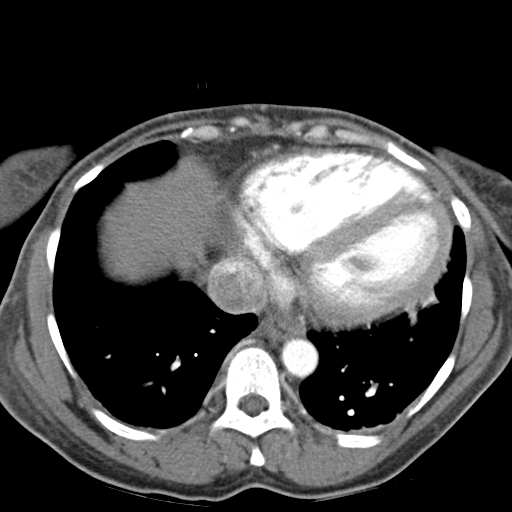
[im 24/64  lung]
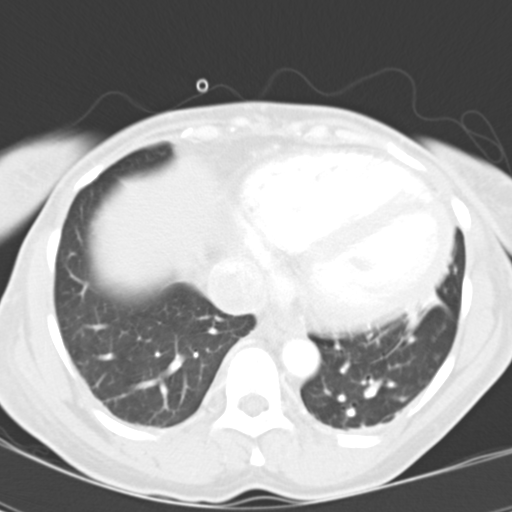
[im 29/64  lung]
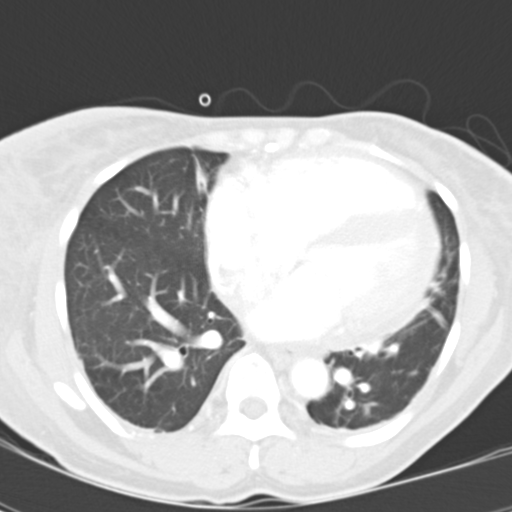
[im 36/64  lung]
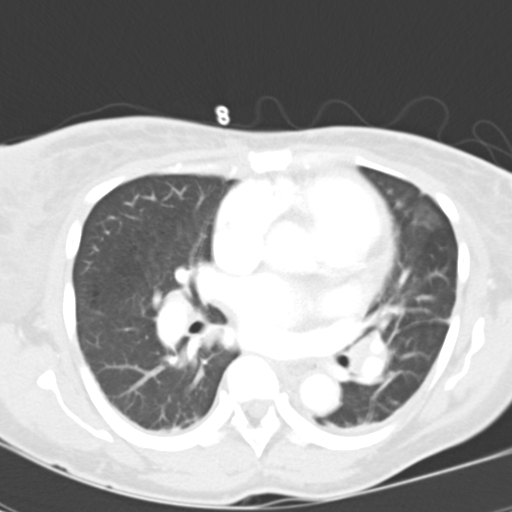
[im 40/64  lung]
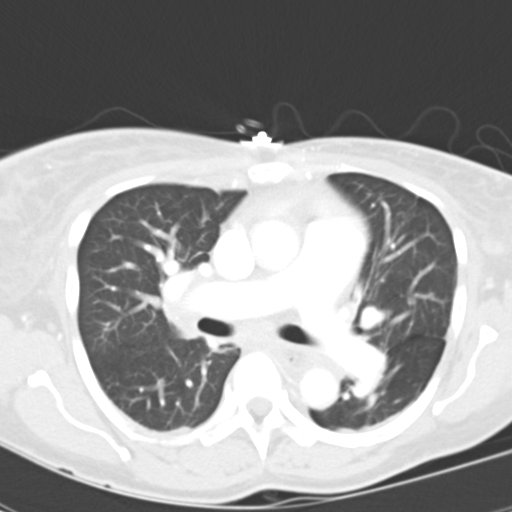
[im 45/64  mediastinal]
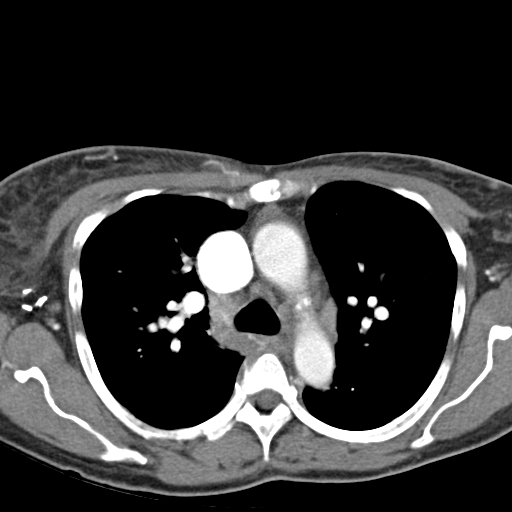
[im 45/64  lung]
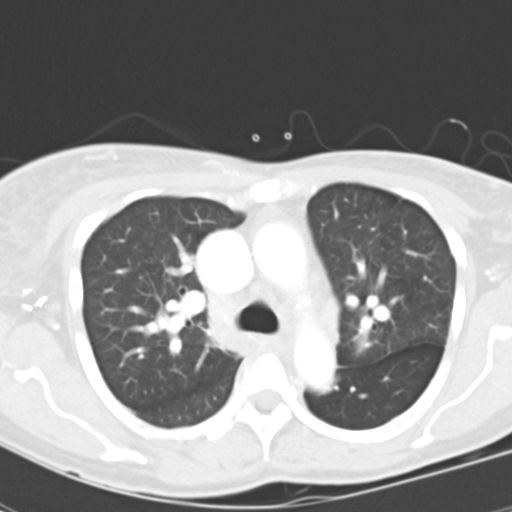
[im 50/64  lung]
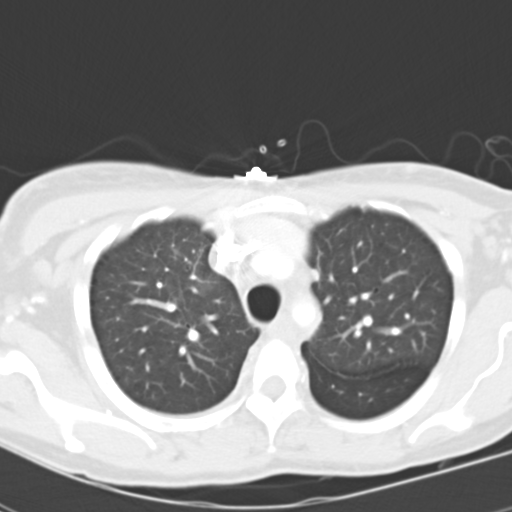
[im 54/64  lung]
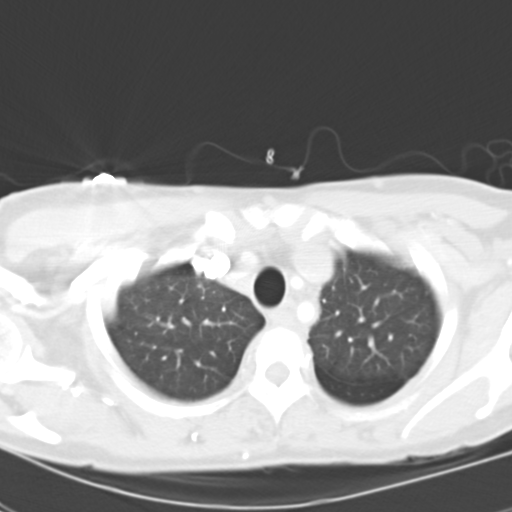
[im 59/64  lung]
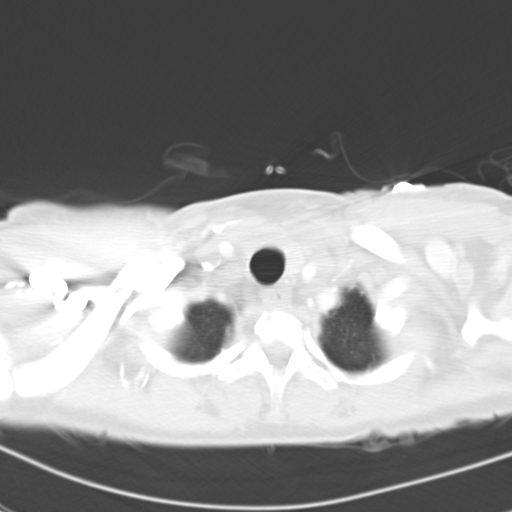

[Series 3: mpr coronal chest · coronal · 0.53mm/px · 3 of 55 slices shown]
[im 11/55  lung]
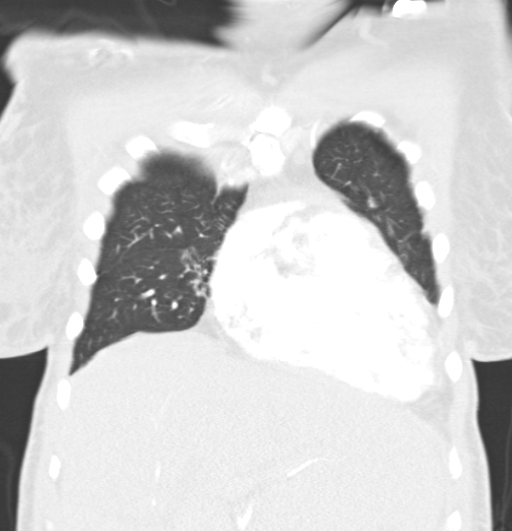
[im 22/55  lung]
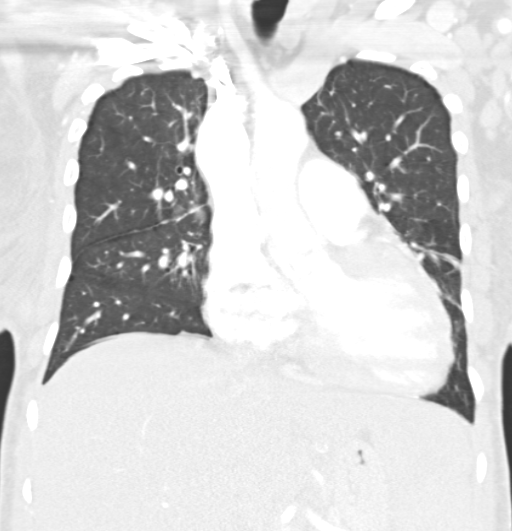
[im 33/55  lung]
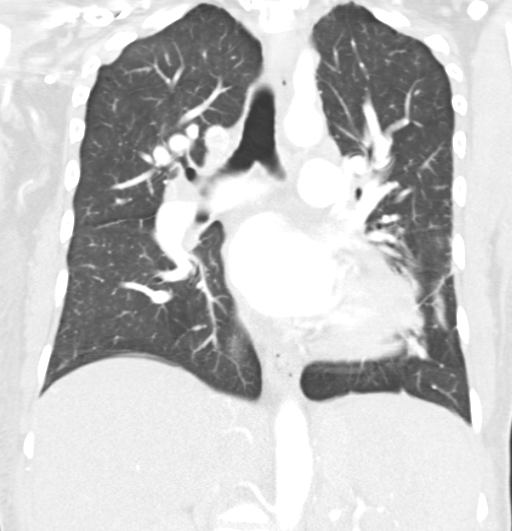

[15 of 36 positions shown; findings below may reference images not displayed]

FINDINGS: Again noted is prominence of the main pulmonary artery measuring approximately 3 cm in diameter.  Also again seen are some fairly prominent mediastinal and hilar lymph nodes with a right hilar node on image #26 measuring approximately 1.0 cm in diameter, unchanged.  Subcarinal lymph node measures 2.7 cm transverse, also unchanged.  The patient is status post right axillary dissection.  Small left axillary lymph nodes are noted.  There is also a supraclavicular lymph node on the left on image #2 measuring 1.7 x 1.1 cm in diameter. Heart size is mildly enlarged.  There is no pleural or pericardial effusion.  Patchy areas of ground glass attenuation seen on the prior study are no longer identified.  Some linear opacities in the lung bases most consistent with atelectasis or scarring are again noted.  The liver appears enlarged but is incompletely seen.  The visualized upper abdomen is otherwise unremarkable.  There is no focal bony abnormality.
IMPRESSION: 1. Re-demonstration of mediastinal and hilar adenopathy of indeterminate etiology.  Lymphoproliferative process and sarcoid are within the differential.  
2. No evidence of Pneumocystis pneumonia.  
3. Cardiomegaly without edema.

## 2006-03-09 ENCOUNTER — Encounter (INDEPENDENT_AMBULATORY_CARE_PROVIDER_SITE_OTHER): Payer: Self-pay | Admitting: *Deleted

## 2006-03-09 ENCOUNTER — Ambulatory Visit: Payer: Self-pay | Admitting: Internal Medicine

## 2006-03-09 ENCOUNTER — Encounter: Admission: RE | Admit: 2006-03-09 | Discharge: 2006-03-09 | Payer: Self-pay | Admitting: Internal Medicine

## 2006-03-09 LAB — CONVERTED CEMR LAB
Albumin: 2.6 g/dL — ABNORMAL LOW (ref 3.5–5.2)
CD4 Count: 330 microliters
CO2: 24 meq/L (ref 19–32)
Calcium: 8.1 mg/dL — ABNORMAL LOW (ref 8.4–10.5)
Eosinophils Relative: 1 % (ref 0–5)
Glucose, Bld: 91 mg/dL (ref 70–99)
HCT: 36.5 % (ref 36.0–46.0)
HIV 1 RNA Quant: 1250 copies/mL
HIV-1 RNA Quant, Log: 3.1 — ABNORMAL HIGH (ref <1.70)
Lymphocytes Relative: 29 % (ref 12–46)
Lymphs Abs: 1.2 10*3/uL (ref 0.7–3.3)
Platelets: 85 10*3/uL — ABNORMAL LOW (ref 150–400)
Potassium: 3.4 meq/L — ABNORMAL LOW (ref 3.5–5.3)
Sodium: 133 meq/L — ABNORMAL LOW (ref 135–145)
Total Protein: 11.7 g/dL — ABNORMAL HIGH (ref 6.0–8.3)
WBC: 4.1 10*3/uL (ref 4.0–10.5)

## 2006-03-23 DIAGNOSIS — F3289 Other specified depressive episodes: Secondary | ICD-10-CM | POA: Insufficient documentation

## 2006-03-23 DIAGNOSIS — R229 Localized swelling, mass and lump, unspecified: Secondary | ICD-10-CM | POA: Insufficient documentation

## 2006-03-23 DIAGNOSIS — K859 Acute pancreatitis without necrosis or infection, unspecified: Secondary | ICD-10-CM | POA: Insufficient documentation

## 2006-03-23 DIAGNOSIS — R599 Enlarged lymph nodes, unspecified: Secondary | ICD-10-CM | POA: Insufficient documentation

## 2006-03-23 DIAGNOSIS — I10 Essential (primary) hypertension: Secondary | ICD-10-CM | POA: Insufficient documentation

## 2006-03-23 DIAGNOSIS — D649 Anemia, unspecified: Secondary | ICD-10-CM | POA: Insufficient documentation

## 2006-03-23 DIAGNOSIS — F329 Major depressive disorder, single episode, unspecified: Secondary | ICD-10-CM

## 2006-03-23 DIAGNOSIS — K219 Gastro-esophageal reflux disease without esophagitis: Secondary | ICD-10-CM | POA: Insufficient documentation

## 2006-03-23 DIAGNOSIS — F411 Generalized anxiety disorder: Secondary | ICD-10-CM | POA: Insufficient documentation

## 2006-03-23 DIAGNOSIS — Z87898 Personal history of other specified conditions: Secondary | ICD-10-CM | POA: Insufficient documentation

## 2006-03-23 DIAGNOSIS — N289 Disorder of kidney and ureter, unspecified: Secondary | ICD-10-CM | POA: Insufficient documentation

## 2006-03-23 DIAGNOSIS — R519 Headache, unspecified: Secondary | ICD-10-CM | POA: Insufficient documentation

## 2006-03-23 DIAGNOSIS — Z9079 Acquired absence of other genital organ(s): Secondary | ICD-10-CM | POA: Insufficient documentation

## 2006-03-23 DIAGNOSIS — R51 Headache: Secondary | ICD-10-CM | POA: Insufficient documentation

## 2006-04-06 ENCOUNTER — Encounter (INDEPENDENT_AMBULATORY_CARE_PROVIDER_SITE_OTHER): Payer: Self-pay | Admitting: *Deleted

## 2006-04-06 ENCOUNTER — Encounter: Admission: RE | Admit: 2006-04-06 | Discharge: 2006-04-06 | Payer: Self-pay | Admitting: Internal Medicine

## 2006-04-06 ENCOUNTER — Ambulatory Visit: Payer: Self-pay | Admitting: Internal Medicine

## 2006-04-06 LAB — CONVERTED CEMR LAB
ALT: 38 units/L — ABNORMAL HIGH (ref 0–35)
Albumin: 2.8 g/dL — ABNORMAL LOW (ref 3.5–5.2)
Alkaline Phosphatase: 69 units/L (ref 39–117)
CD4 Count: 850 microliters
CO2: 25 meq/L (ref 19–32)
Glucose, Bld: 79 mg/dL (ref 70–99)
HIV 1 RNA Quant: <50 copies/mL (ref <50)
HIV-1 RNA Quant, Log: <1.7 (ref <1.70)
Lymphocytes Relative: 27 % (ref 12–46)
Lymphs Abs: 1.1 10*3/uL (ref 0.7–3.3)
Neutrophils Relative %: 55 % (ref 43–77)
Platelets: 87 10*3/uL — ABNORMAL LOW (ref 150–400)
Potassium: 3.3 meq/L — ABNORMAL LOW (ref 3.5–5.3)
Sodium: 133 meq/L — ABNORMAL LOW (ref 135–145)
Total Protein: 11.6 g/dL — ABNORMAL HIGH (ref 6.0–8.3)
WBC: 4.1 10*3/uL (ref 4.0–10.5)

## 2006-04-09 ENCOUNTER — Encounter: Payer: Self-pay | Admitting: Internal Medicine

## 2006-04-11 ENCOUNTER — Inpatient Hospital Stay (HOSPITAL_COMMUNITY): Admission: EM | Admit: 2006-04-11 | Discharge: 2006-04-20 | Payer: Self-pay | Admitting: Emergency Medicine

## 2006-04-11 IMAGING — CR DG CHEST 1V PORT
1 series · 1 of 1 positions shown · non-contrast
Comparison: [DATE]

CLINICAL DATA: Fever, chest pain and congestion.  
 PORTABLE CHEST - [DATE] AT [FN] HOURS:

[view not recorded]
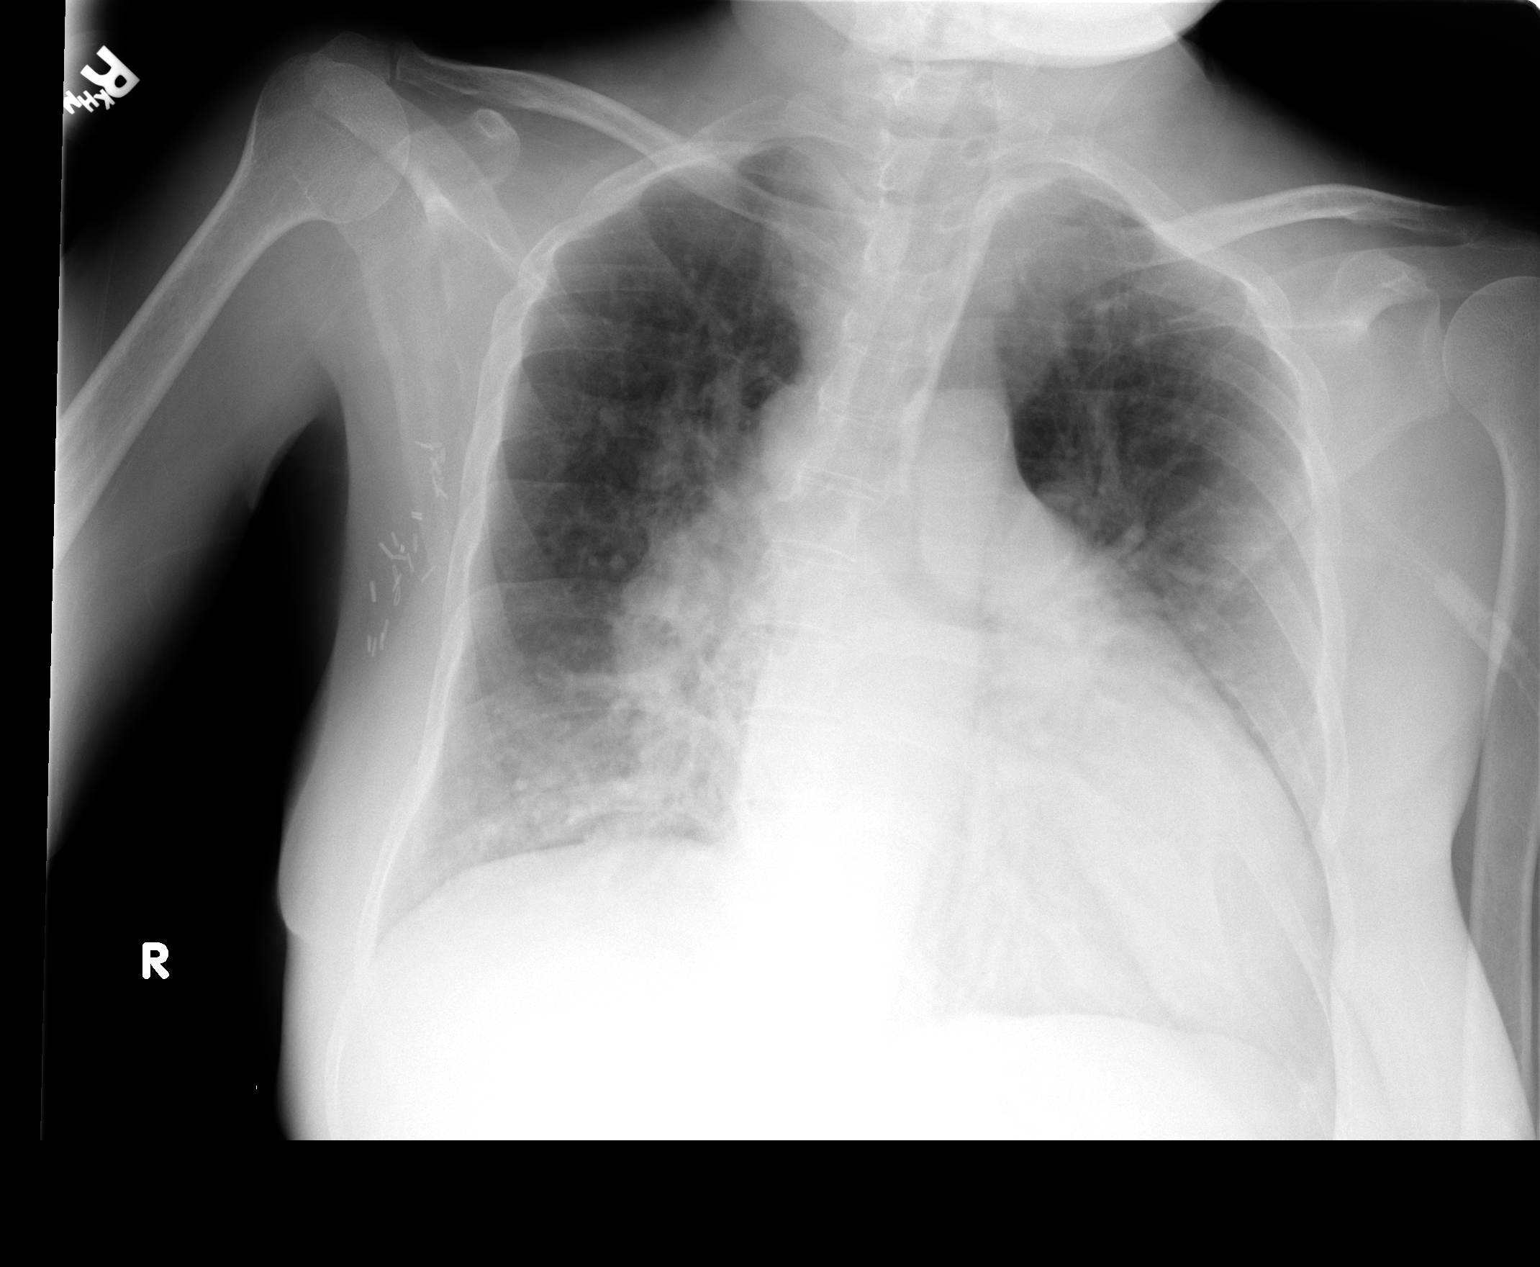

[1 of 1 positions shown; findings below may reference images not displayed]

FINDINGS: Superimposed on chronic lung disease is some additional new opacity at the medial right lung base which may certainly represent an acute infiltrate.  However, atelectasis may also have this appearance.  There remains substantial cardiomegaly as well as evidence of persistent and chronic hilar and mediastinal adenopathy.  No overt edema or pleural effusions.
IMPRESSION: New right lower lung infiltrate.  Chronic lung disease and stable cardiomegaly.  Stable appearance of likely chronic lymphadenopathy in the mediastinum and both hilar regions.

## 2006-04-14 IMAGING — CR DG CHEST 2V
2 series · 2 of 2 positions shown · non-contrast
Comparison: none

HISTORY: Dyspnea, followup pneumonia

CHEST 2 VIEWS:
Comparison made to [DATE]
Cardiomegaly with pulmonary vascular congestion.
Right hilar enlargement.
Improving right middle lobe infiltrate though persistent perihilar and basal
infiltrates remain bilaterally.
Bronchitic changes.
No pleural effusion or pneumothorax.

[view not recorded (1 of 2)]
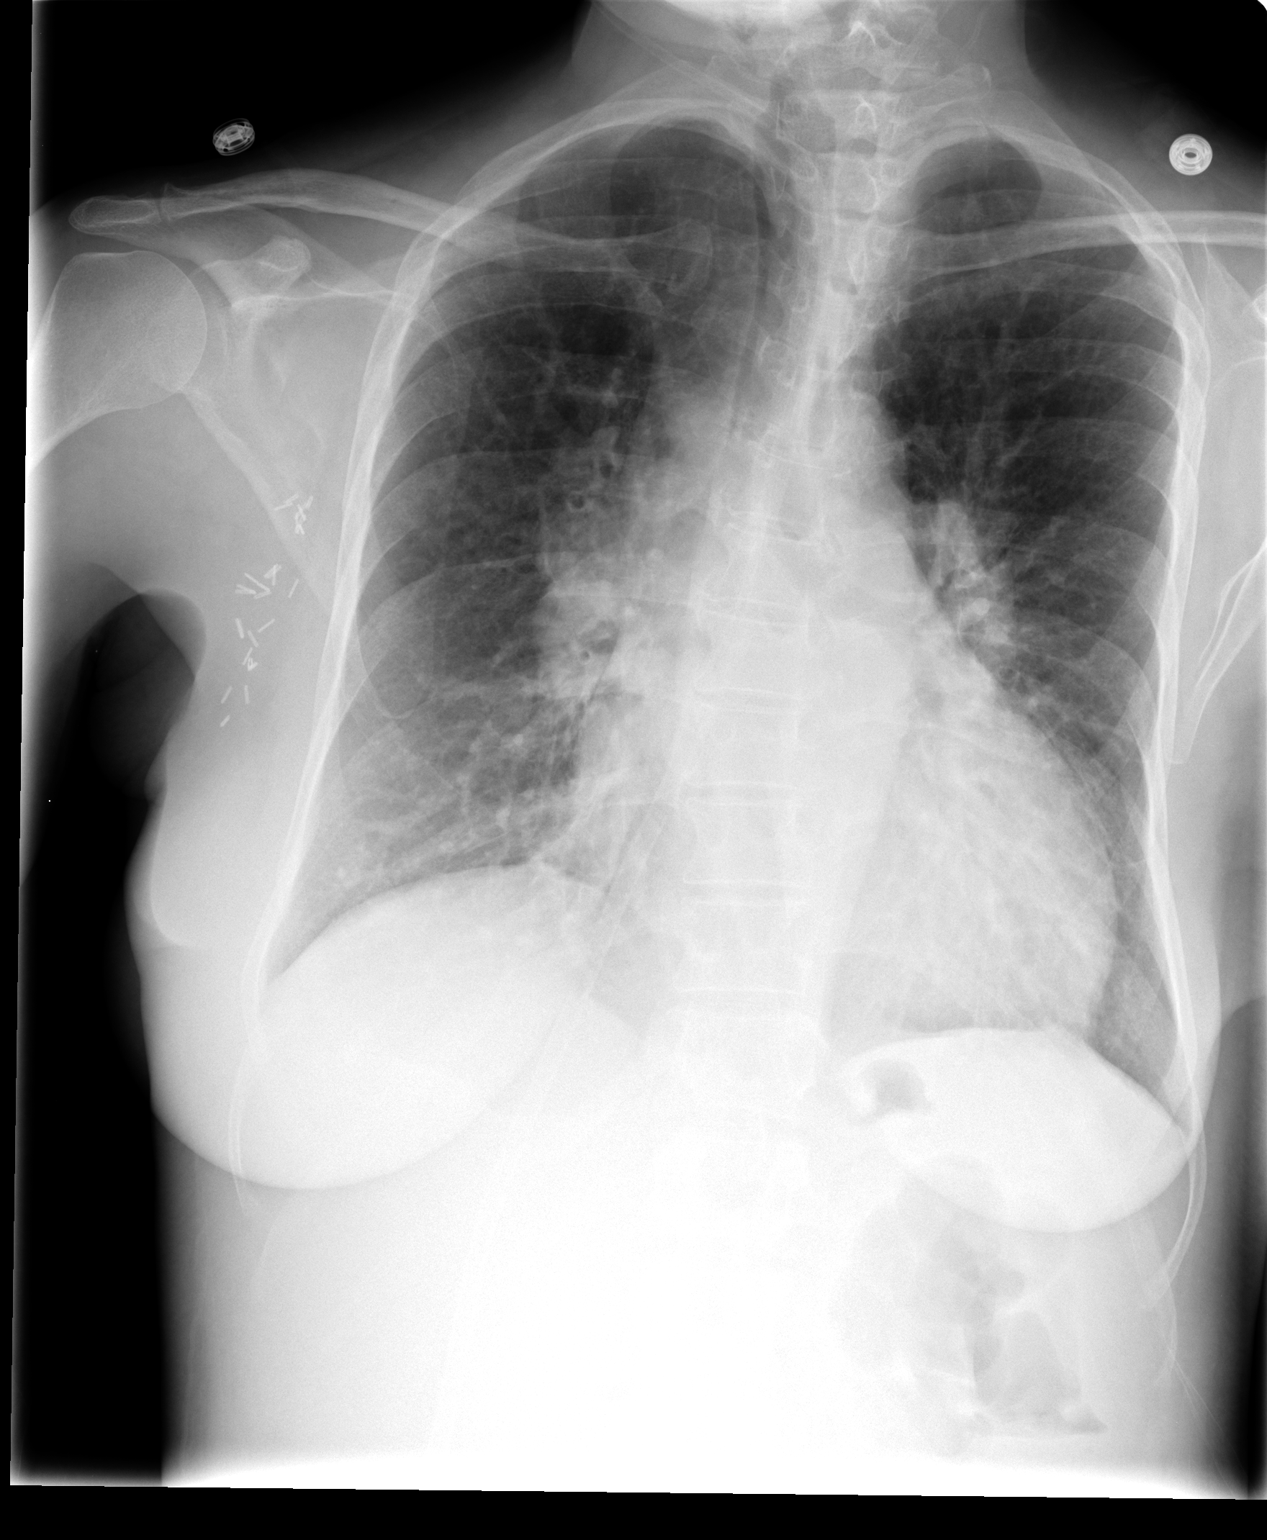

[view not recorded (2 of 2)]
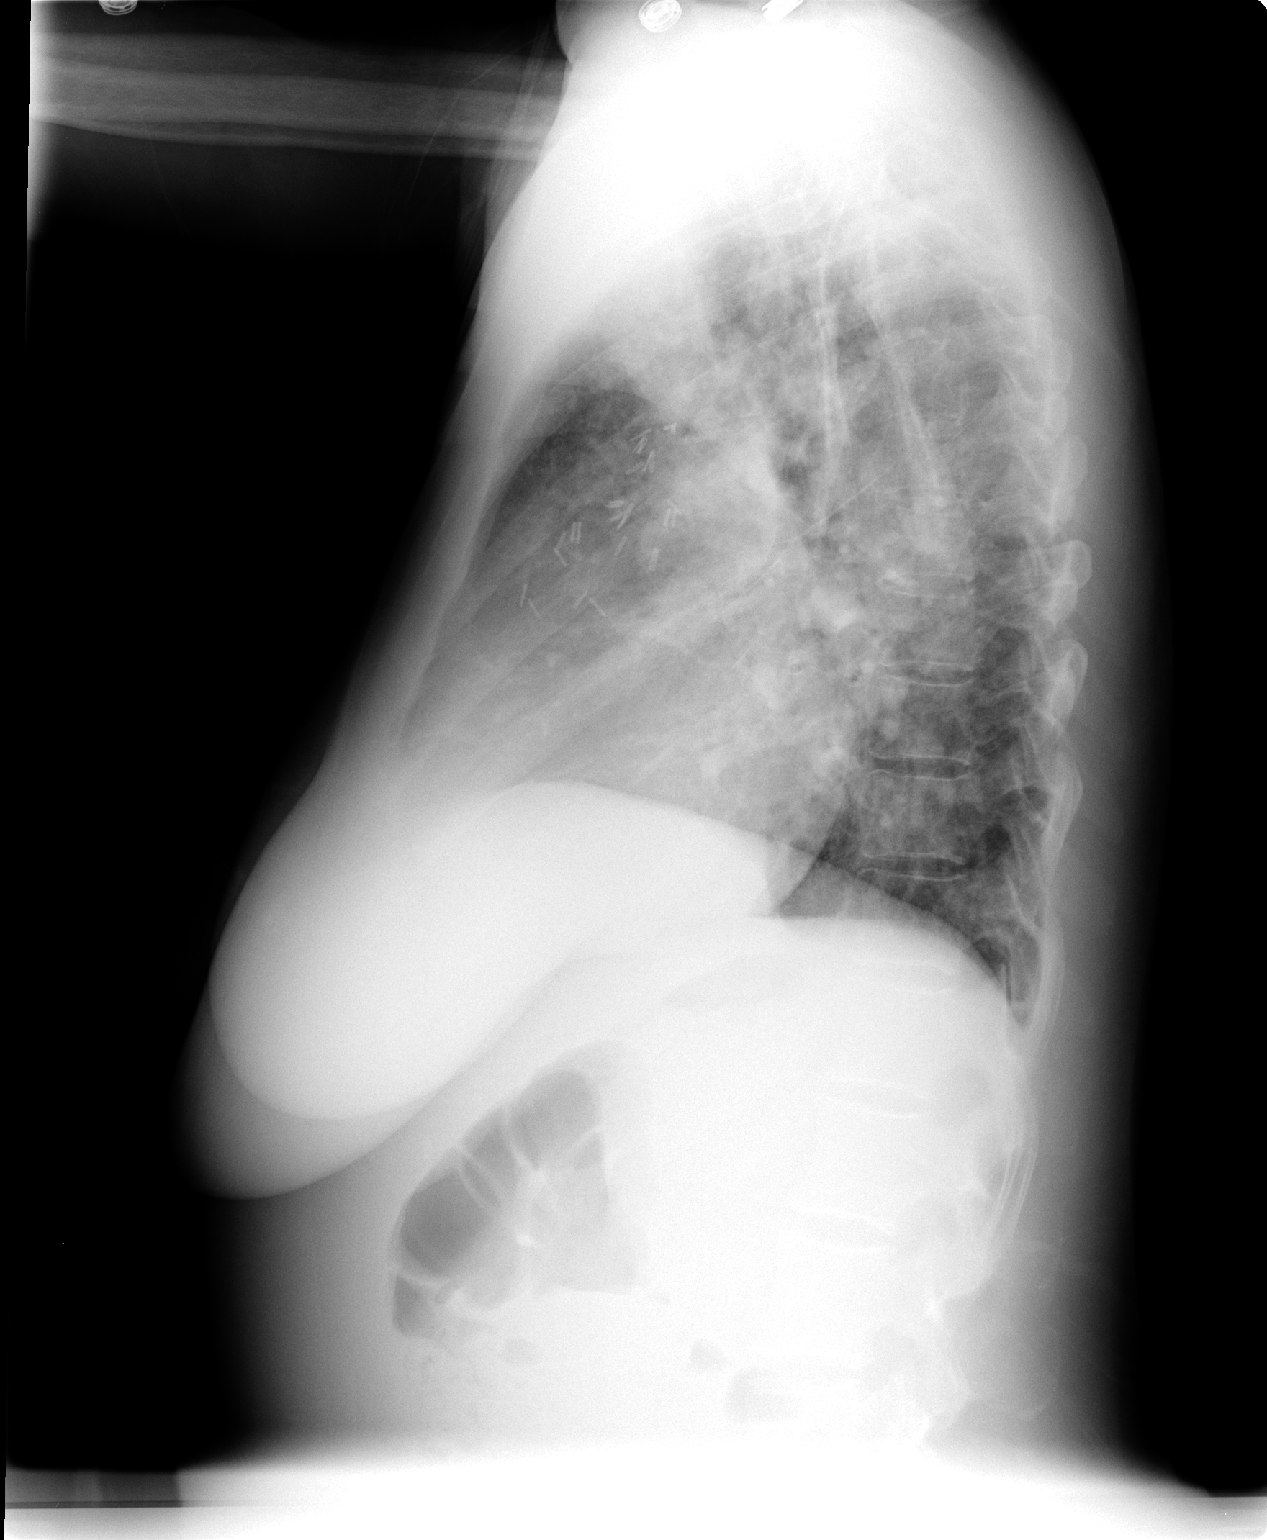

[2 of 2 positions shown; findings below may reference images not displayed]

IMPRESSION: Cardiomegaly with pulmonary vascular congestion.
Bronchitic changes with bilateral perihilar infiltrates, question edema versus
infection.
Stable hilar enlargement.

## 2006-04-17 IMAGING — CR DG CHEST 2V
2 series · 2 of 2 positions shown · non-contrast
Comparison: [DATE]
 The heart is enlarged.

CLINICAL DATA: Pneumonia.  
 [Q5] VIEWS:

[view not recorded (1 of 2)]
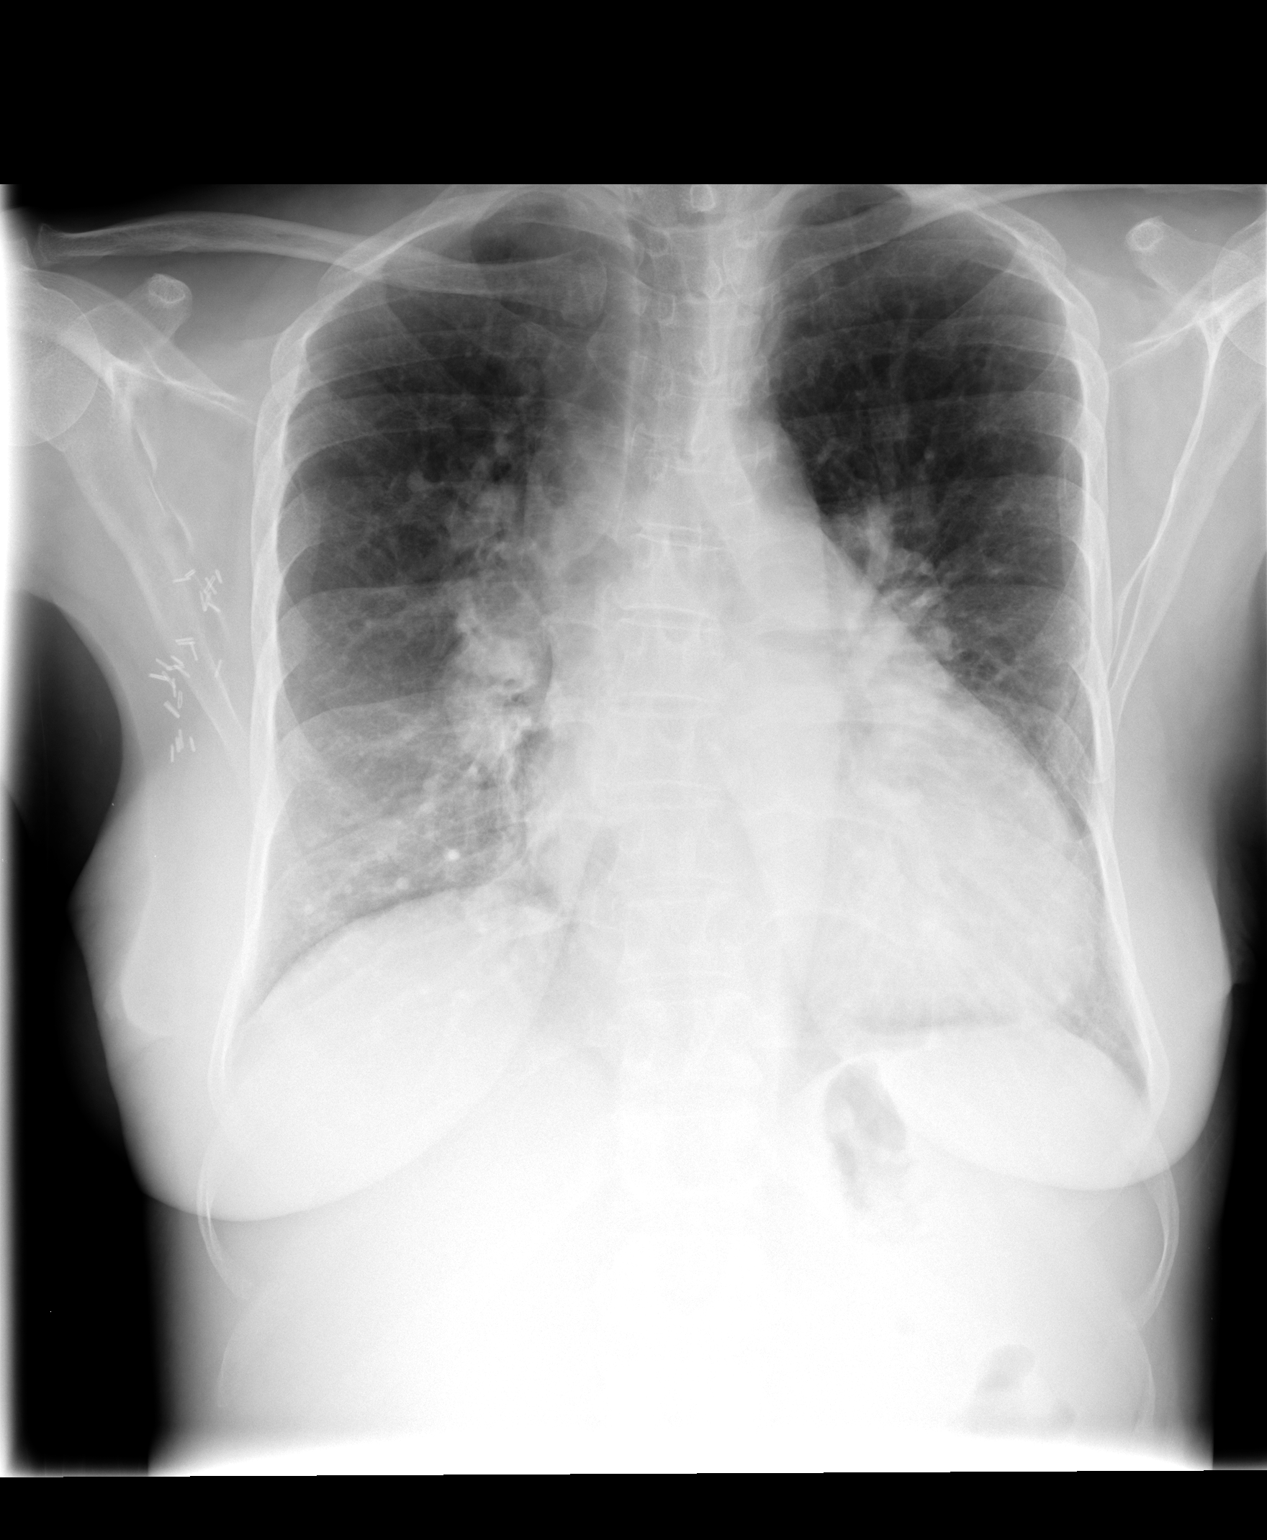

[view not recorded (2 of 2)]
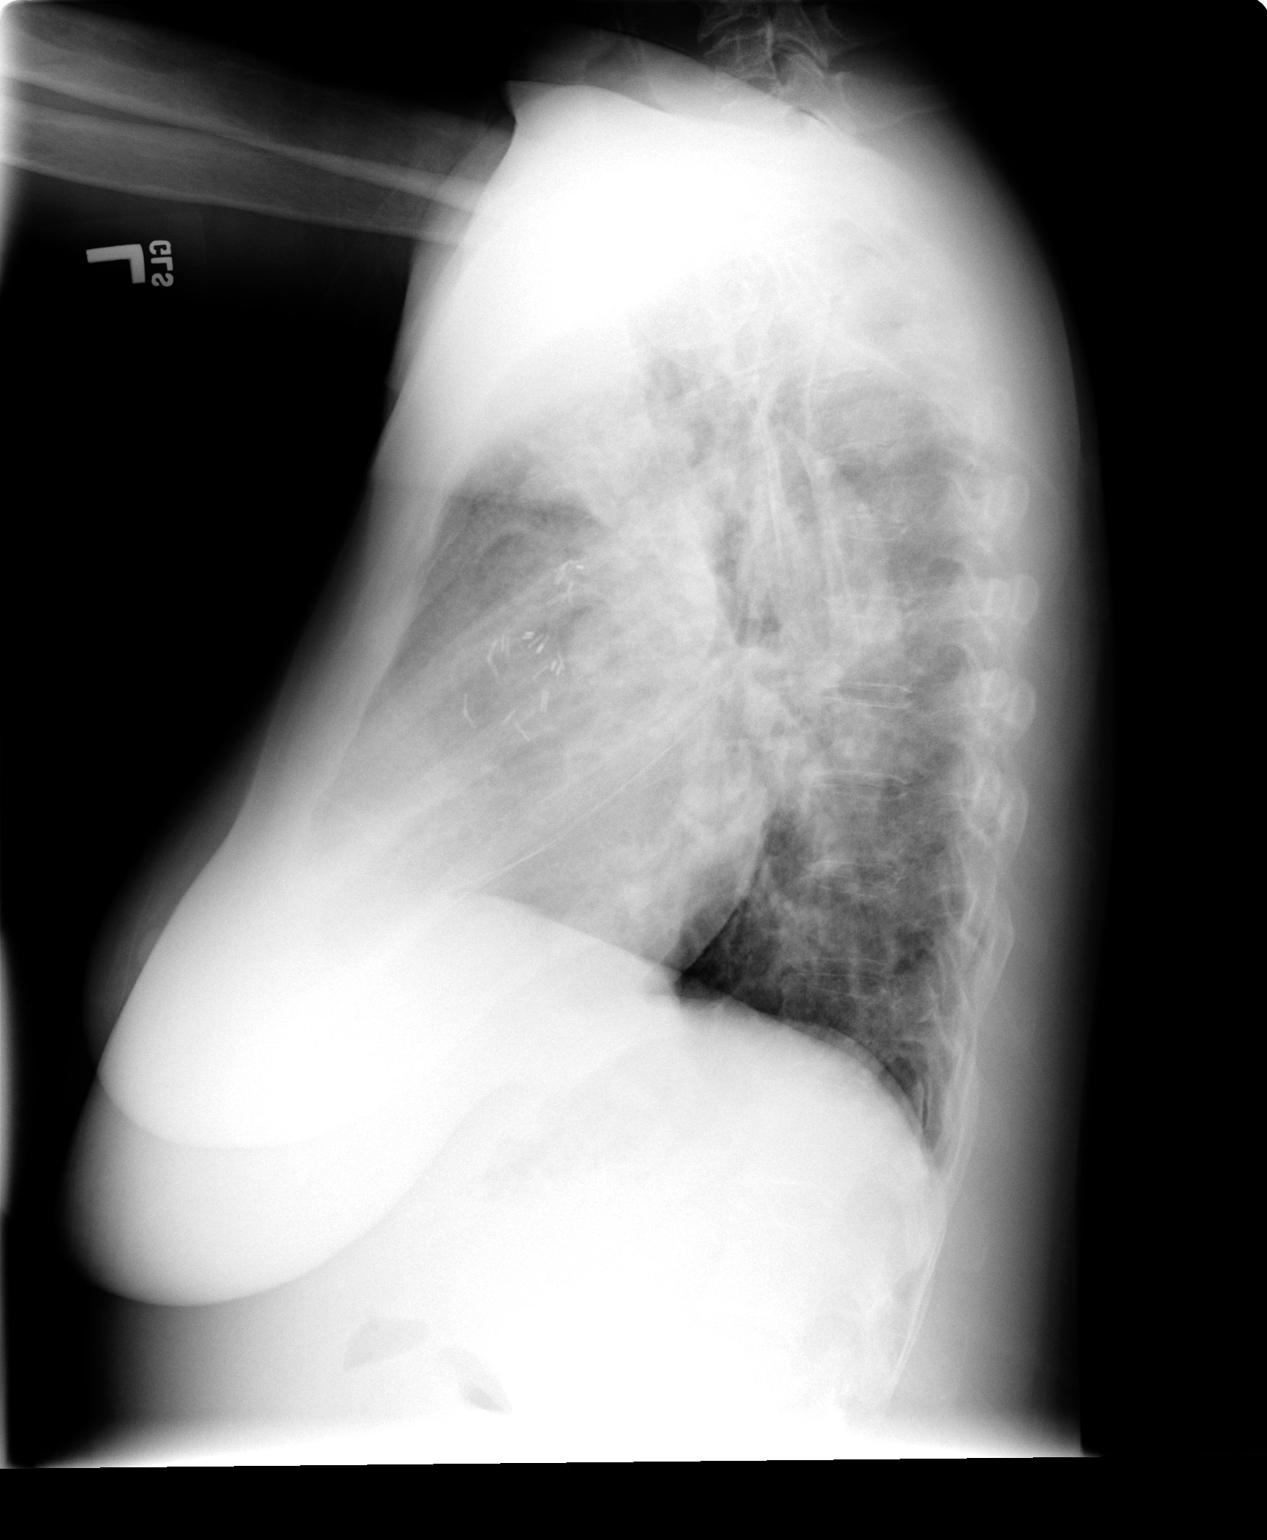

[2 of 2 positions shown; findings below may reference images not displayed]

There is pulmonary vascular congestion which is unchanged.  There may be some mild perihilar adenopathy also present but this is difficult to identify given the vascular congestion.  No definite acute infiltrate or effusion is seen.
IMPRESSION: 1.  Cardiac enlargement and pulmonary vascular congestion.  Question hilar adenopathy.  
 2.  No interval change.

## 2006-04-19 IMAGING — CR DG CHEST 2V
2 series · 2 of 2 positions shown · non-contrast
Comparison: [DATE].

CLINICAL DATA: Pneumonia.  
 CHEST - 2 VIEWS ? [DATE]:

[view not recorded (1 of 2)]
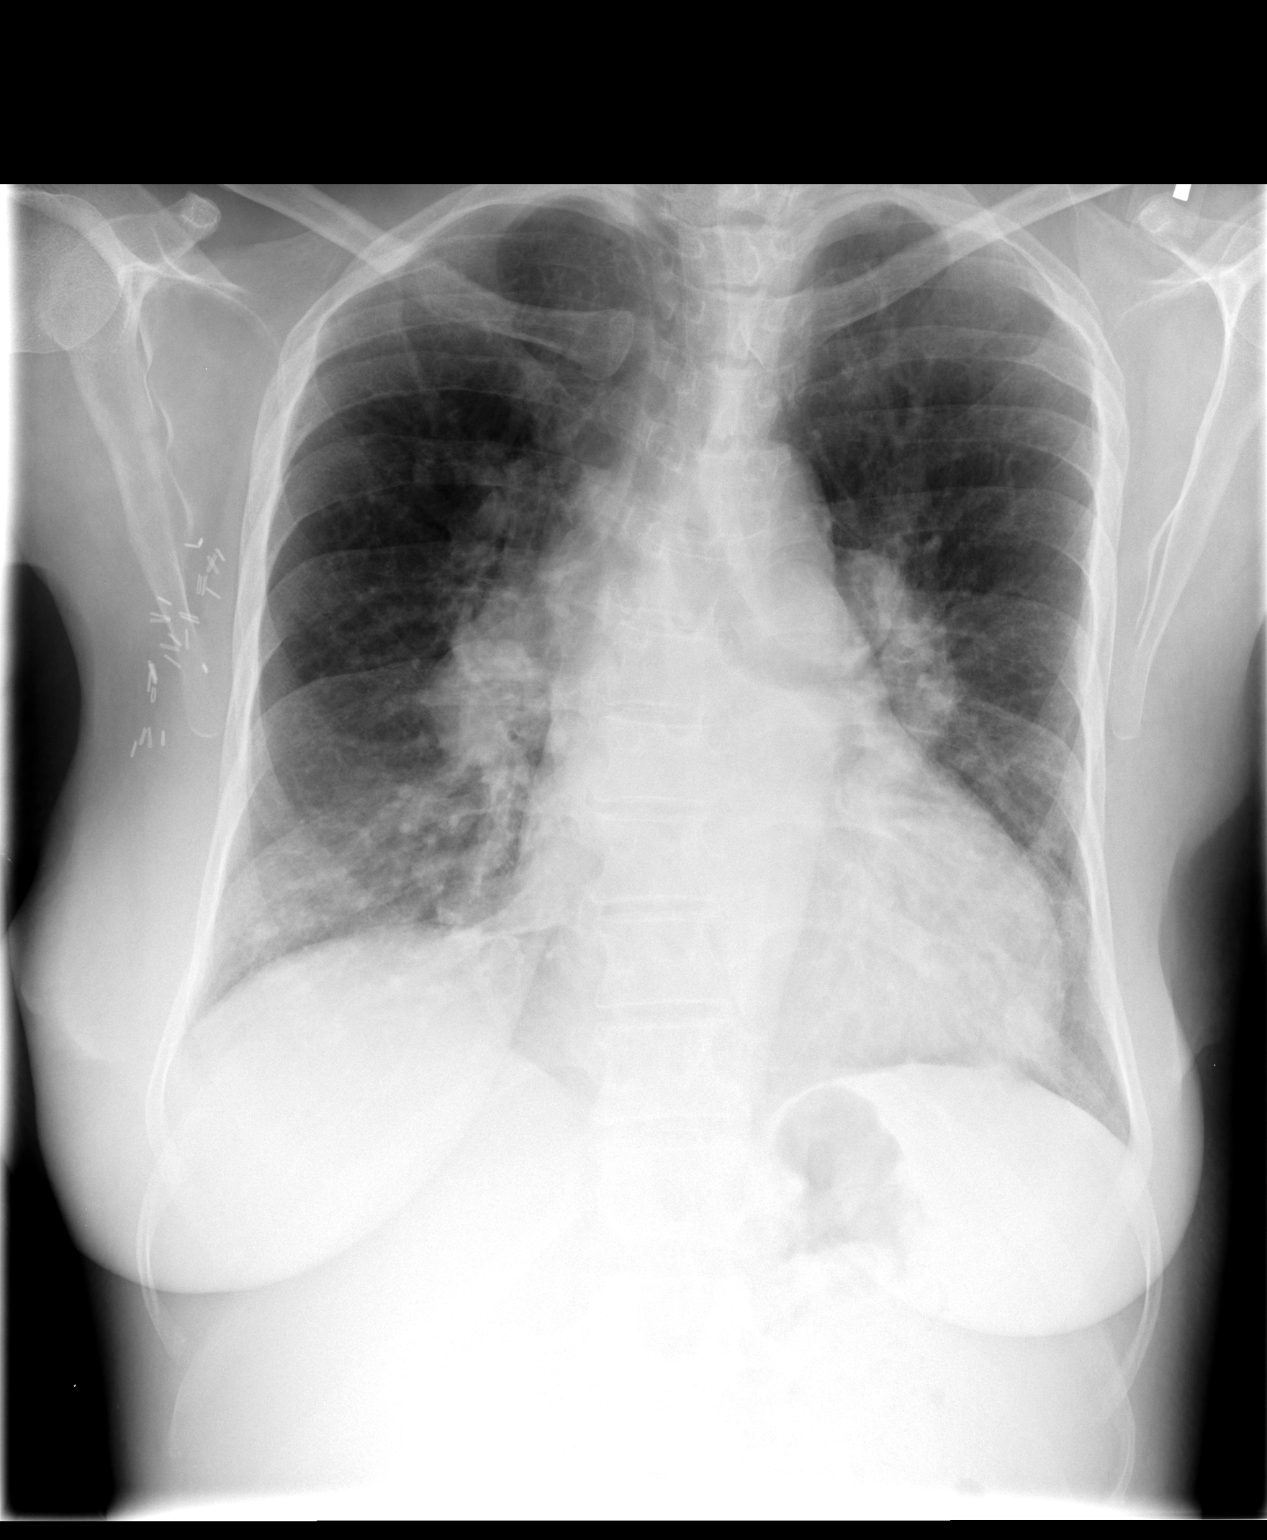

[view not recorded (2 of 2)]
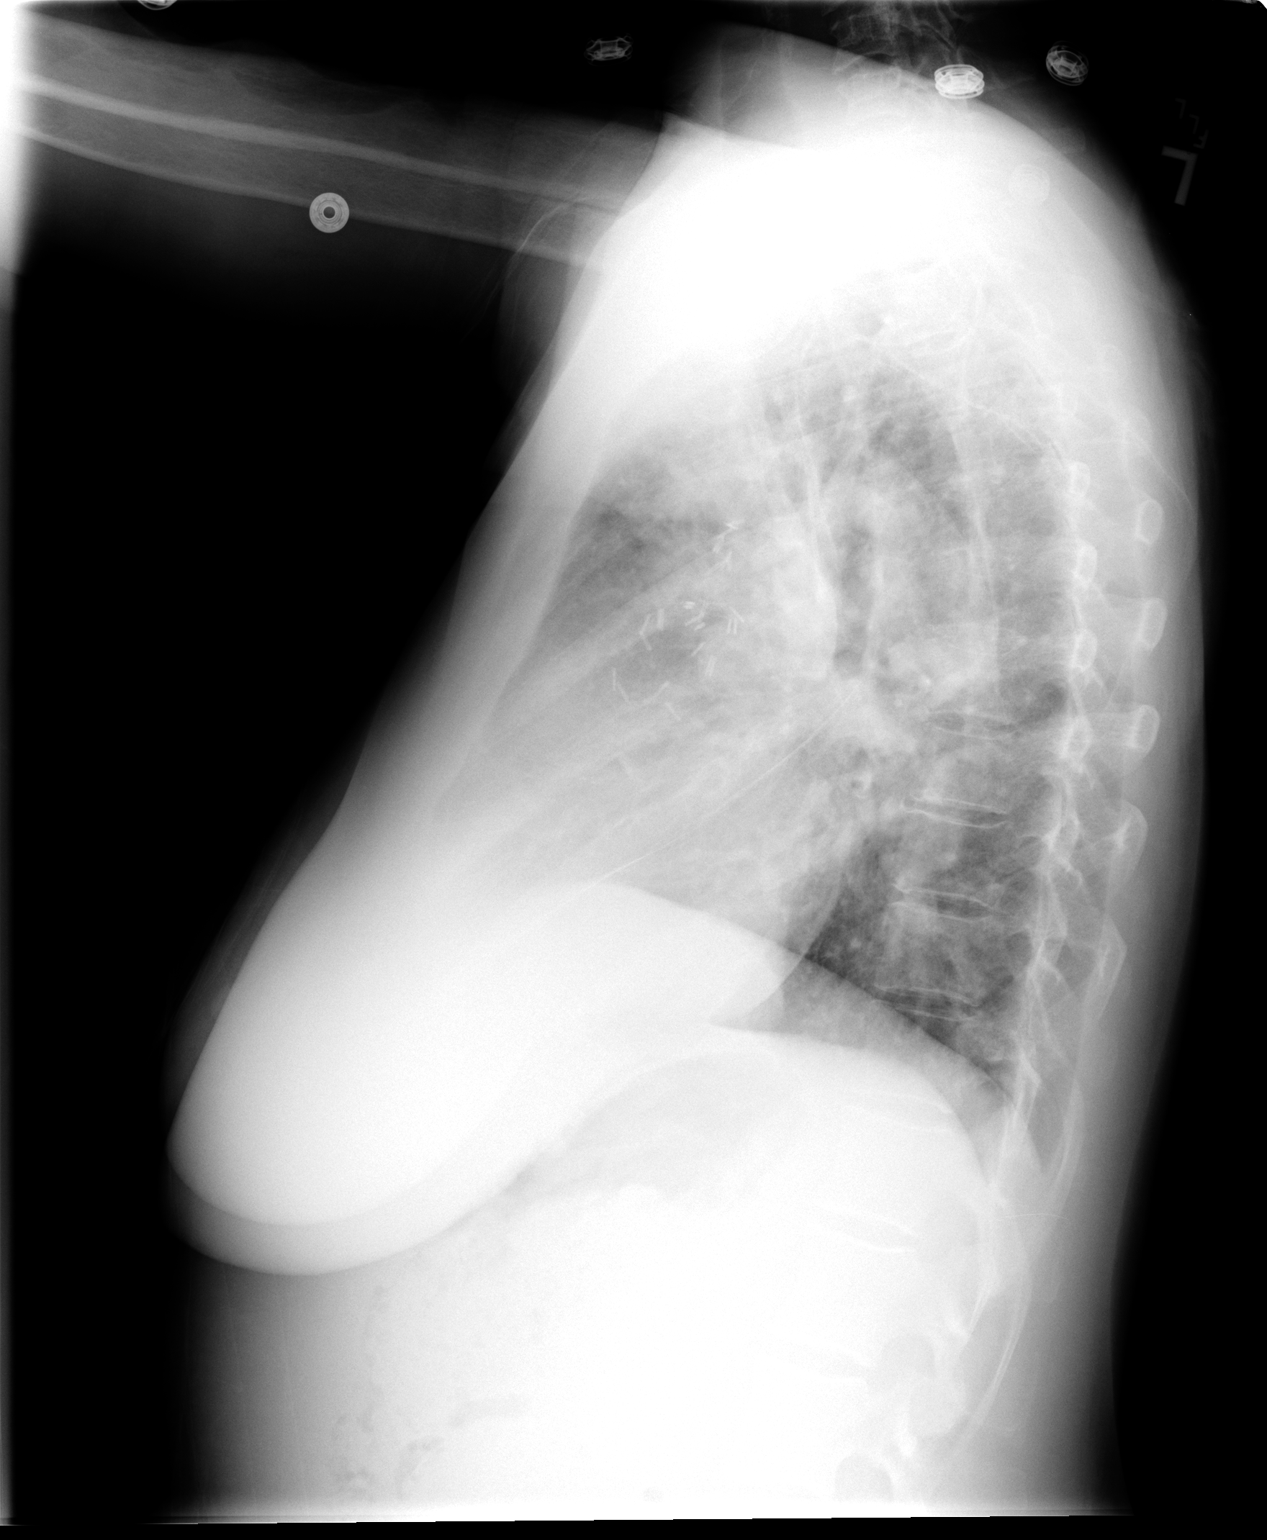

[2 of 2 positions shown; findings below may reference images not displayed]

FINDINGS: Negative for pneumonia.  Cardiomegaly.  There is bihilar enlargement ass noted on the prior examination.  On the [DATE] CT, the pulmonary arteries were noted to be enlarged compatible with PAH.  There was also bihilar adenopathy.  There is mild redistribution of pulmonary blood flow to the upper lung zones compatible with mild pulmonary venous hypertension.  There are no frank congestive heart failure findings.
IMPRESSION: Negative for pneumonia.  
 Findings compatible with cardiomegaly and pulmonary arterial hypertension.  Question bihilar adenopathy as well.

## 2006-04-20 ENCOUNTER — Ambulatory Visit: Payer: Self-pay | Admitting: Internal Medicine

## 2006-04-26 ENCOUNTER — Encounter (INDEPENDENT_AMBULATORY_CARE_PROVIDER_SITE_OTHER): Payer: Self-pay | Admitting: *Deleted

## 2006-04-26 LAB — CONVERTED CEMR LAB

## 2006-04-29 ENCOUNTER — Telehealth: Payer: Self-pay | Admitting: Internal Medicine

## 2006-05-09 ENCOUNTER — Encounter (INDEPENDENT_AMBULATORY_CARE_PROVIDER_SITE_OTHER): Payer: Self-pay | Admitting: *Deleted

## 2006-05-11 ENCOUNTER — Ambulatory Visit (HOSPITAL_COMMUNITY): Admission: RE | Admit: 2006-05-11 | Discharge: 2006-05-11 | Payer: Self-pay | Admitting: Pulmonary Disease

## 2006-05-11 IMAGING — XA IR [PERSON_NAME]/EXT/UNI*R*
1 series · 13 of 24 positions shown · non-contrast
Comparison: none

CLINICAL DATA: Right arm and breast swelling. Left arm dialysis graft.

[Series 1: run · 13 of 32 slices shown]
[im 1/32]
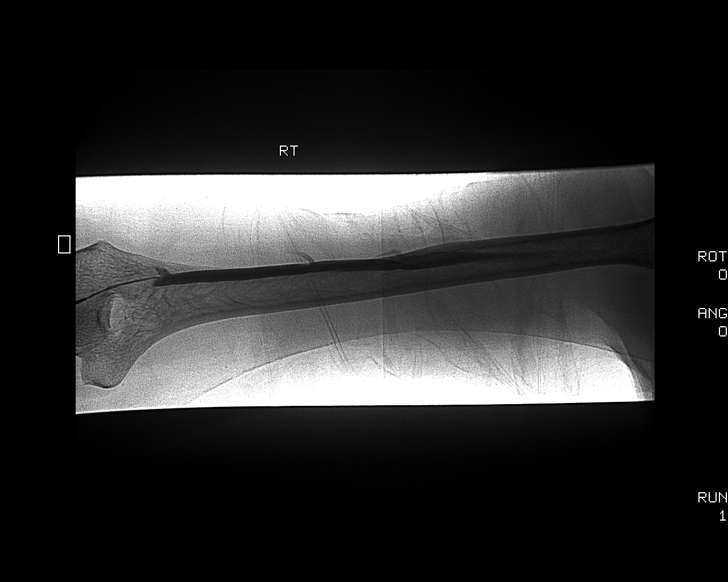
[im 3/32]
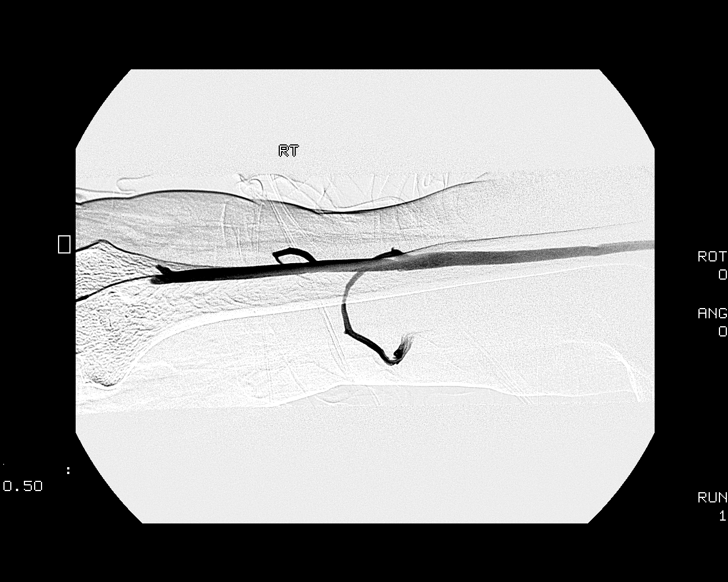
[im 6/32]
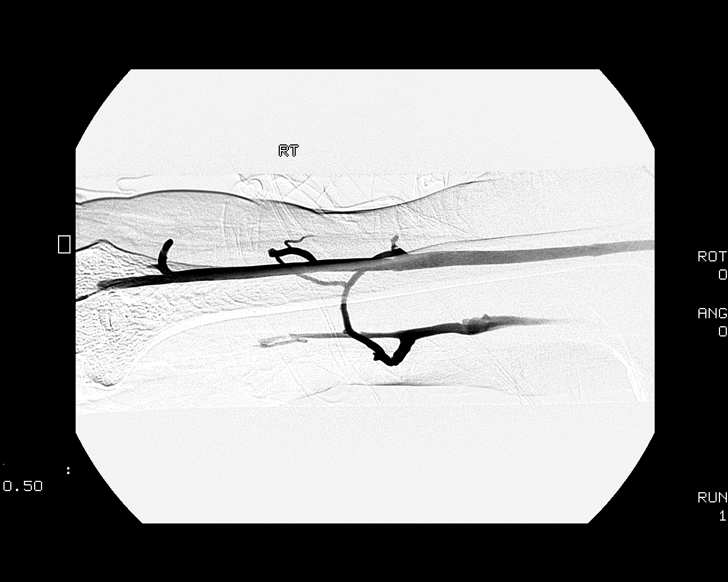
[im 9/32]
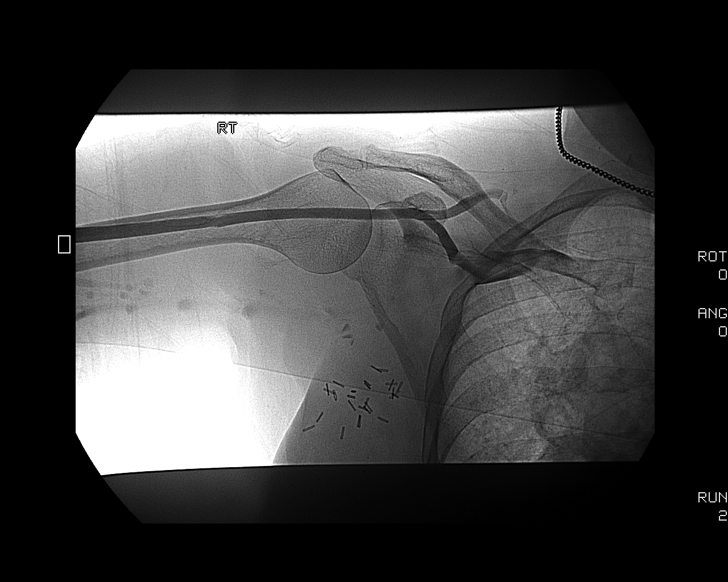
[im 11/32]
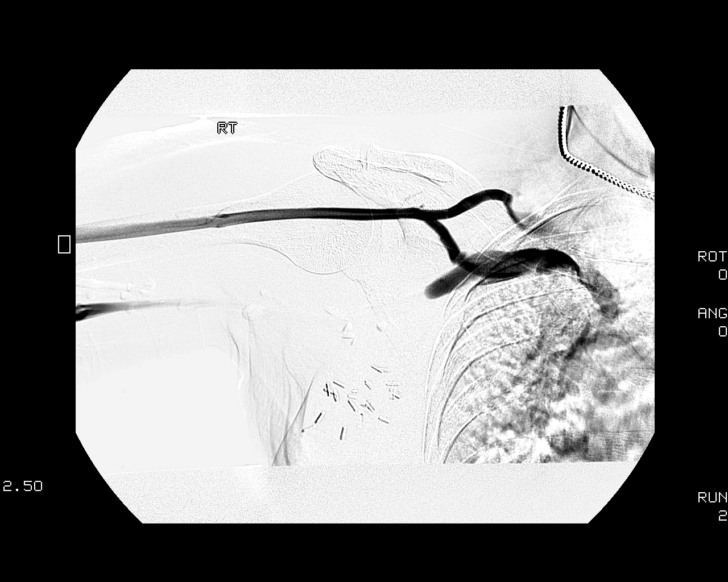
[im 14/32]
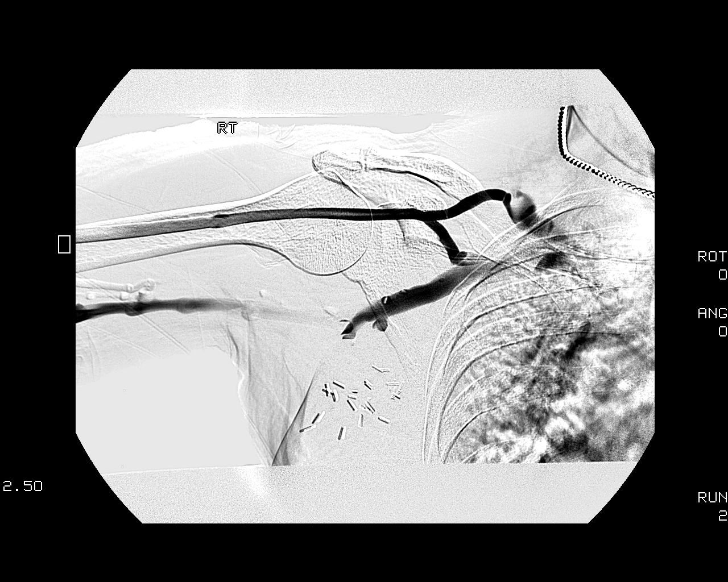
[im 17/32]
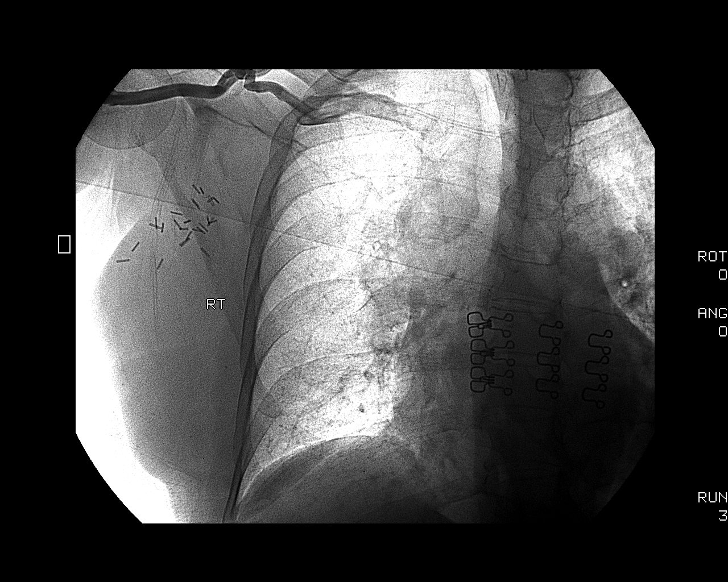
[im 18/32]
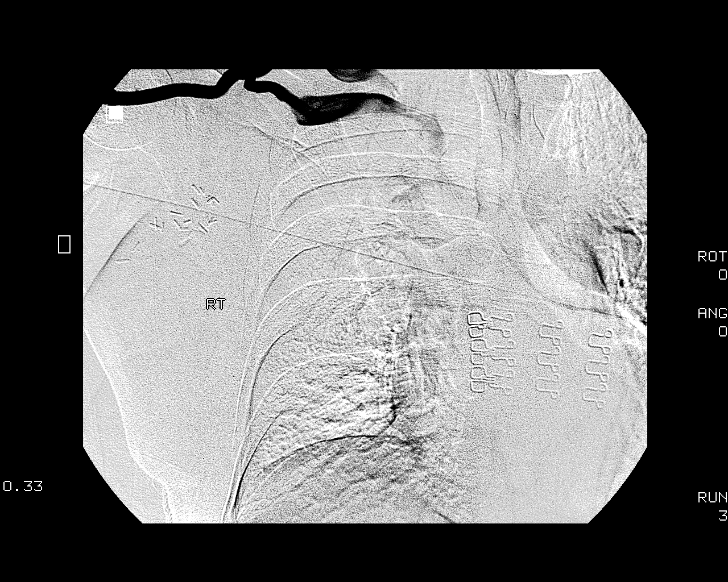
[im 21/32]
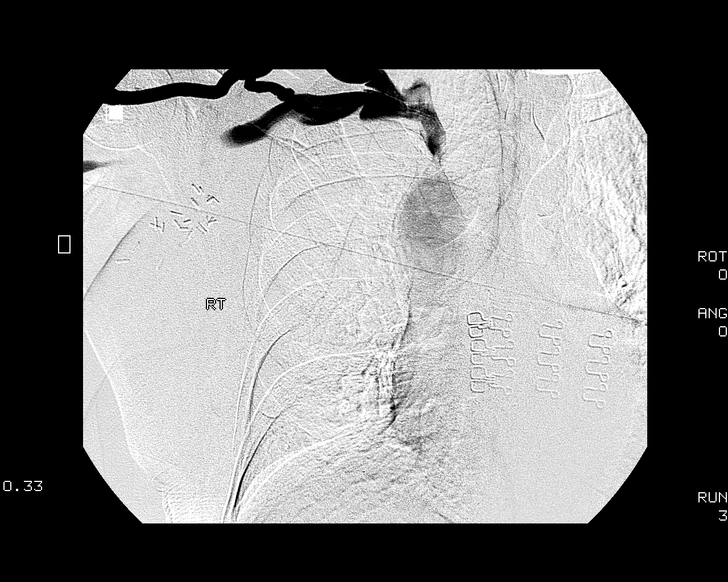
[im 23/32]
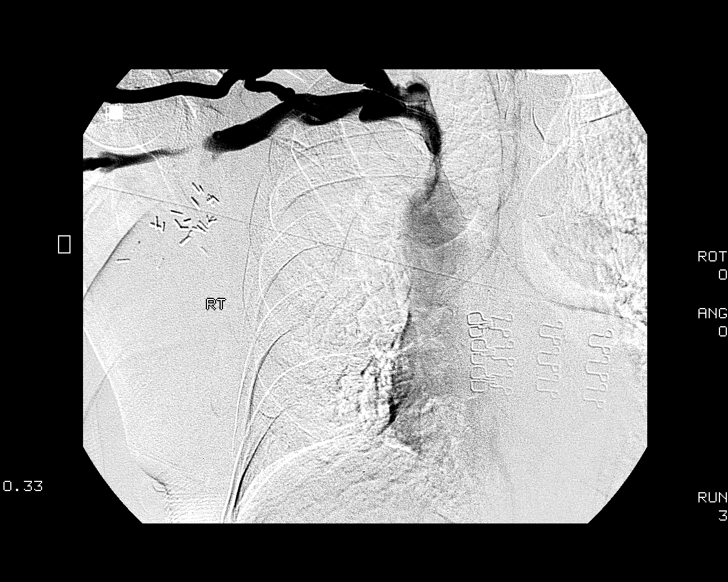
[im 26/32]
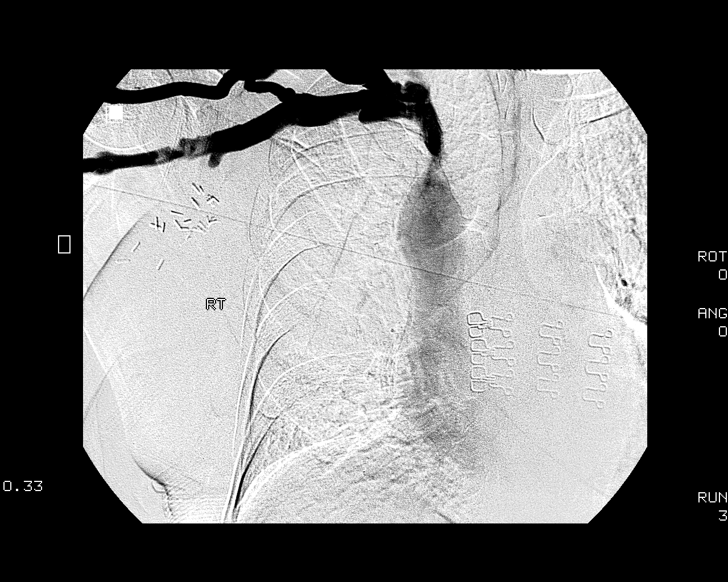
[im 29/32]
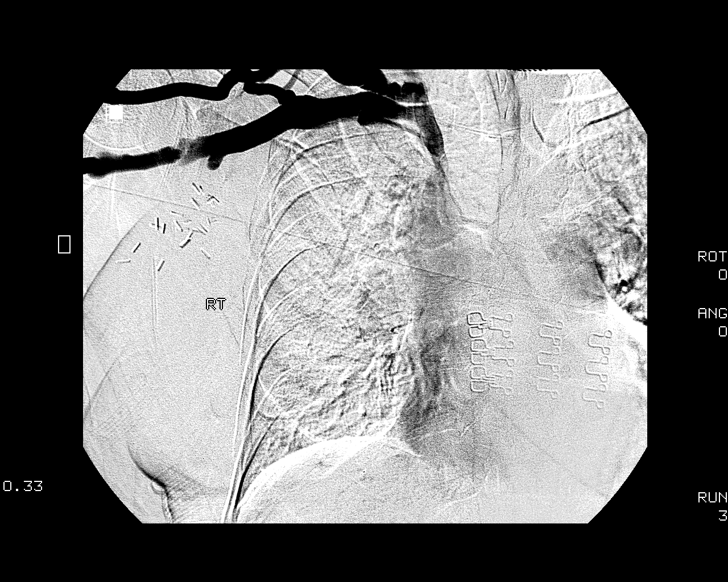
[im 32/32]
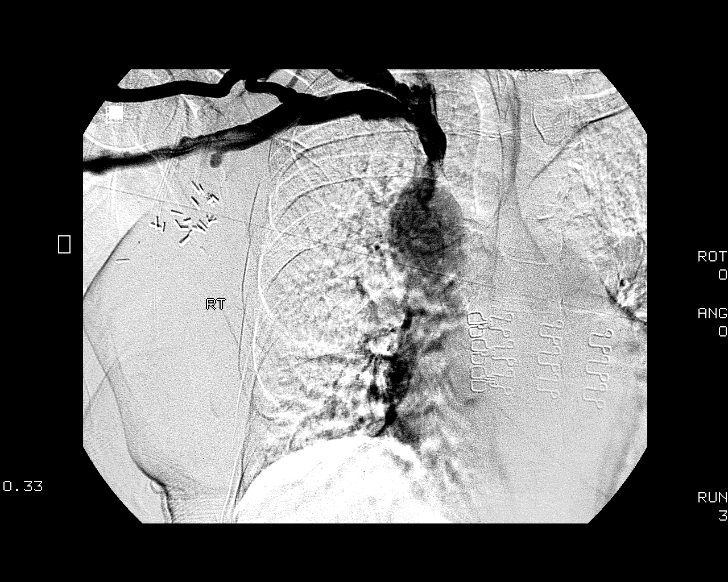

[13 of 24 positions shown; findings below may reference images not displayed]

Right upper extremity venogram:

Contrast was injected through peripheral IV for right upper extremity
venography. The cephalic, brachial, axillary, subclavian, and right
brachiocephalic veins are widely patent. SVC is widely patent. There is no
significant collateral filling or other focal venous lesion. Vascular clips are
noted in the right axilla.
IMPRESSION: 1. Negative for significant venous stenosis or occlusion.

## 2006-06-09 DIAGNOSIS — J189 Pneumonia, unspecified organism: Secondary | ICD-10-CM | POA: Insufficient documentation

## 2006-06-24 ENCOUNTER — Ambulatory Visit (HOSPITAL_COMMUNITY): Admission: RE | Admit: 2006-06-24 | Discharge: 2006-06-24 | Payer: Self-pay | Admitting: Internal Medicine

## 2006-06-24 IMAGING — US US EXTREM LOW VENOUS*R*
1 series · 14 of 24 positions shown · non-contrast
Comparison: none

CLINICAL DATA: Calf pain and swelling

 Right lower extremity venous Doppler ultrasound:
TECHNIQUE: Gray-scale sonography with compression as well as color and duplex
Doppler ultrasound were performed to evaluate the deep venous system from the
level of the common femoral vein through the popliteal and proximal calf veins.

[Series 1: us extrem low venous*right* · 14 of 30 slices shown]
[im 1/30]
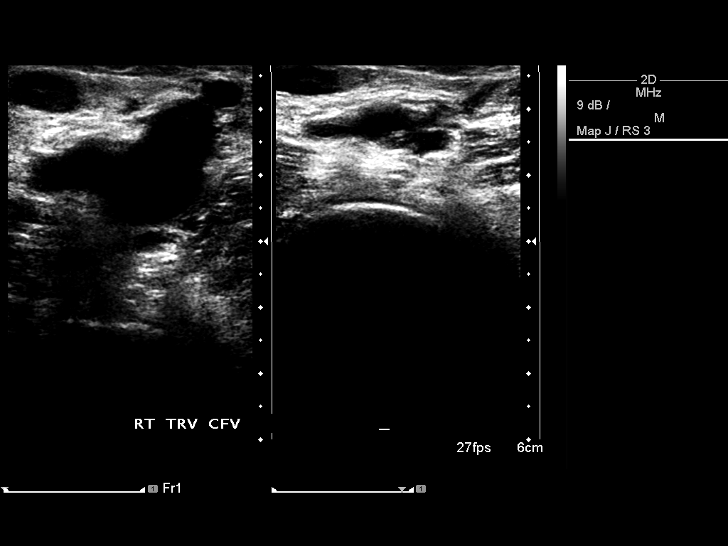
[im 3/30]
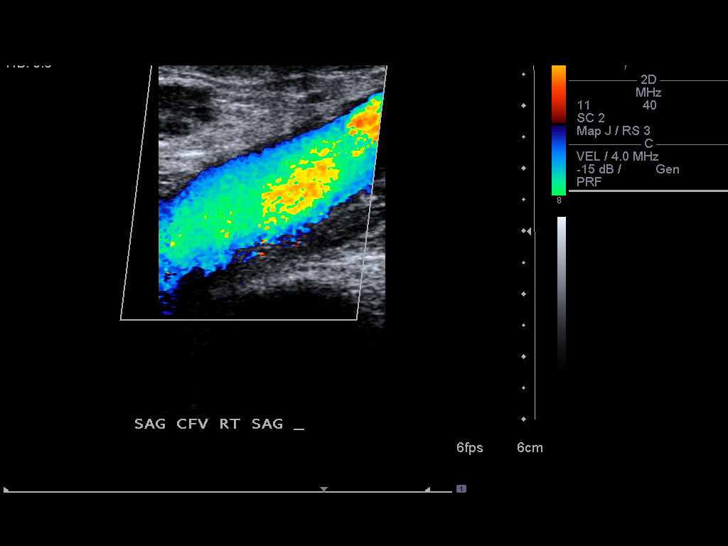
[im 6/30]
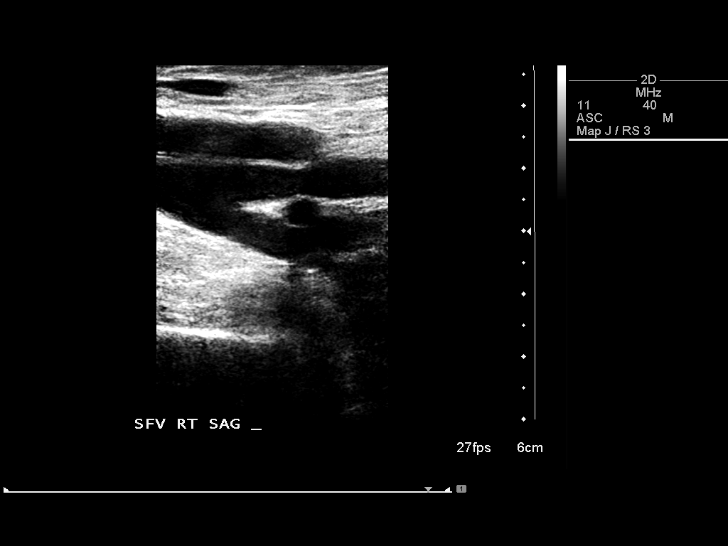
[im 8/30]
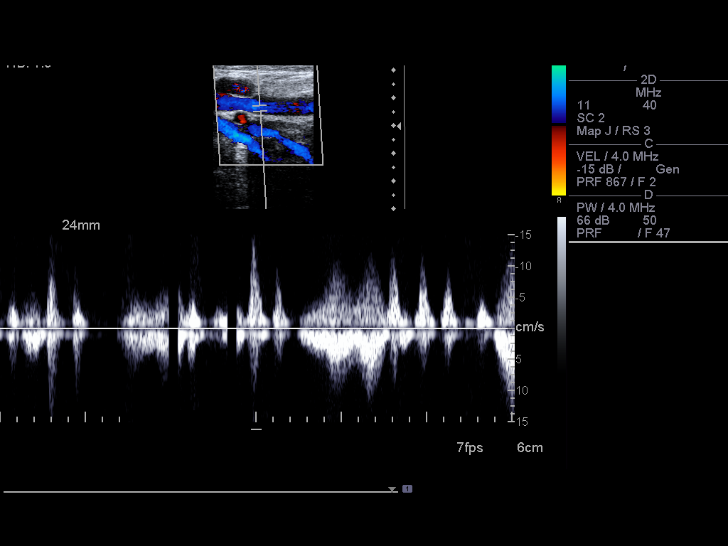
[im 9/30]
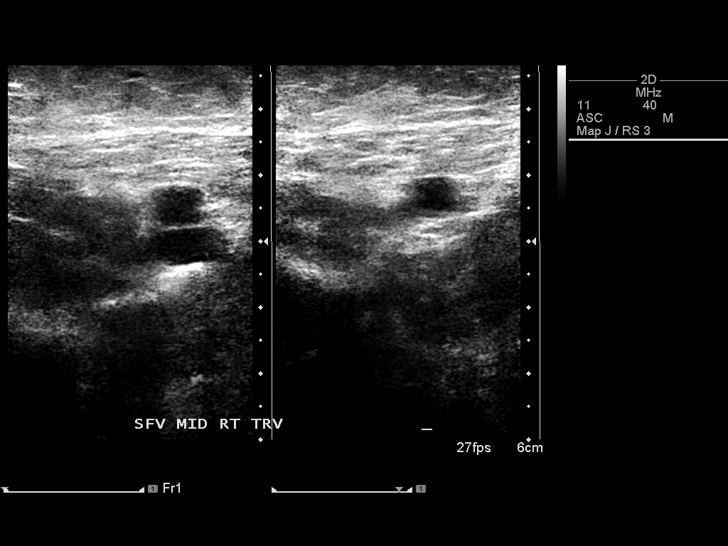
[im 12/30]
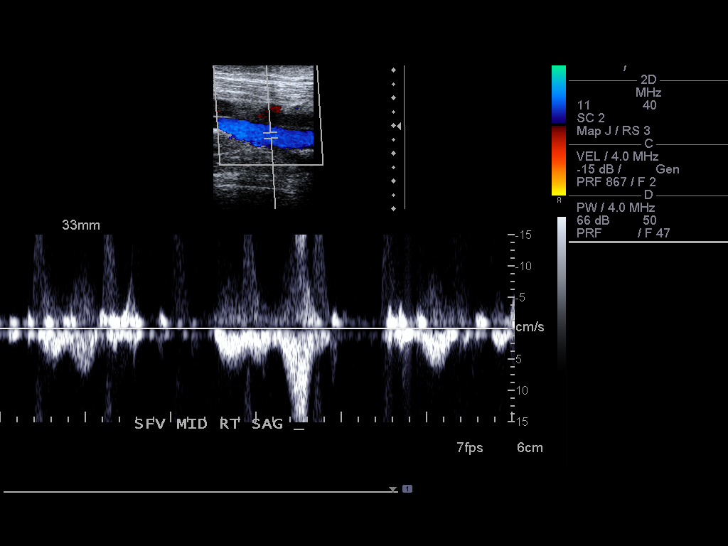
[im 14/30]
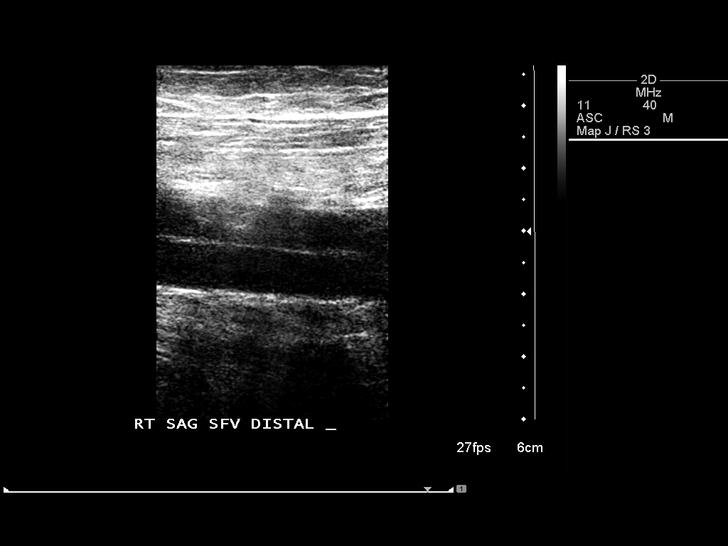
[im 16/30]
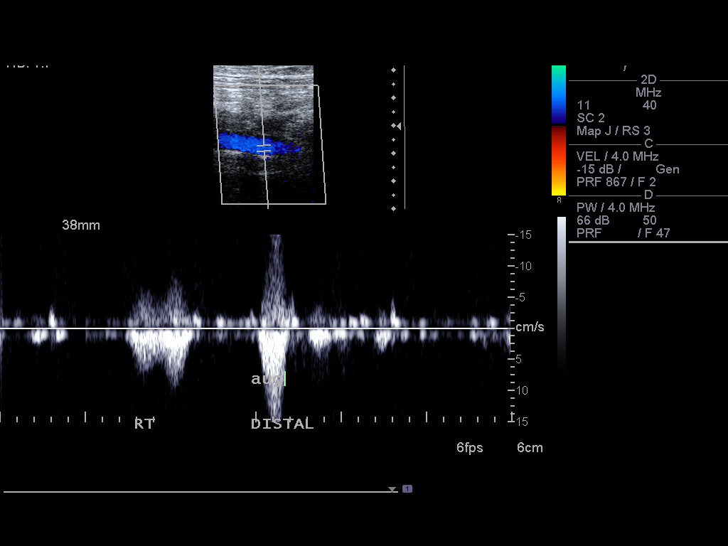
[im 18/30]
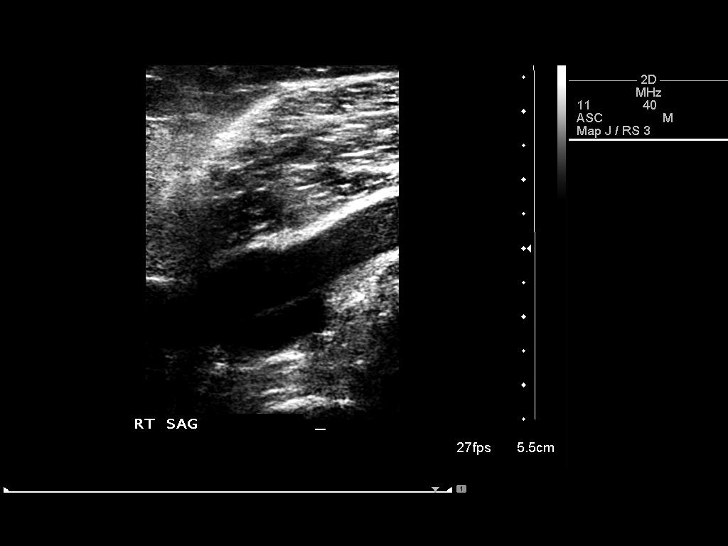
[im 21/30]
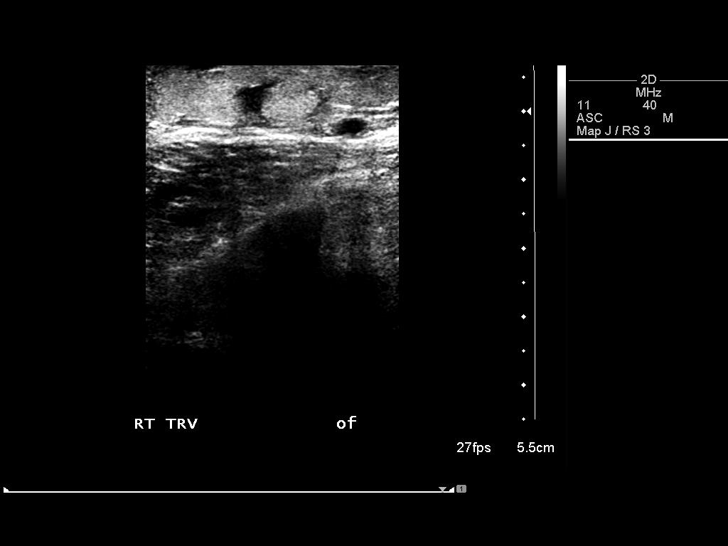
[im 23/30]
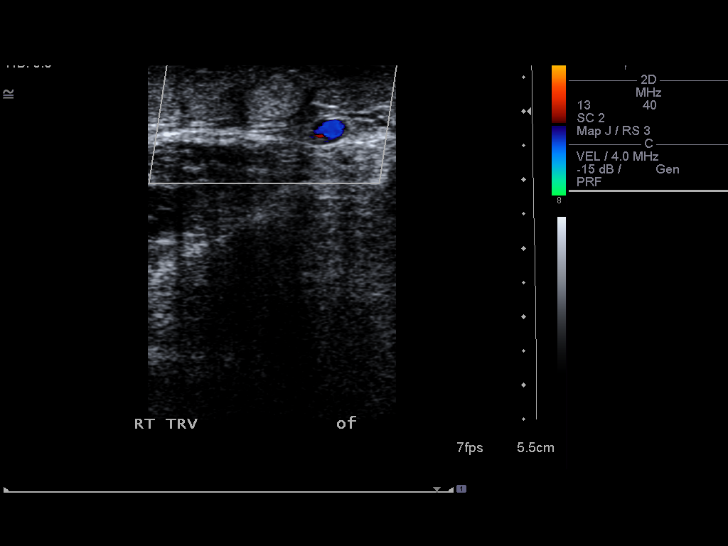
[im 24/30]
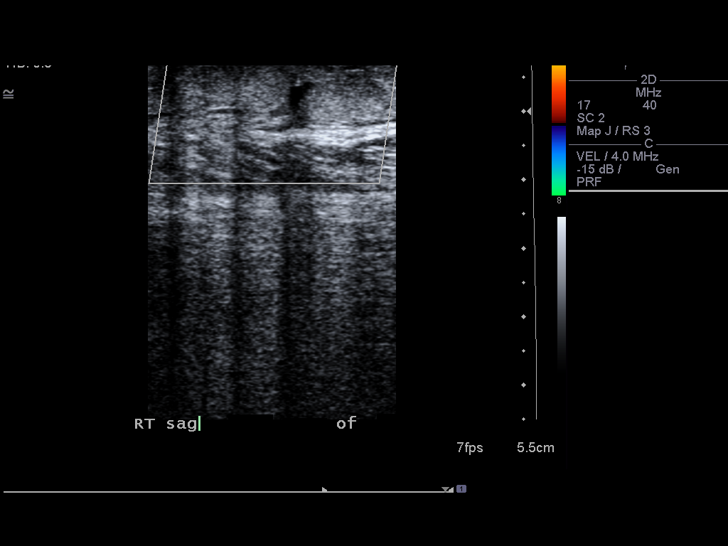
[im 27/30]
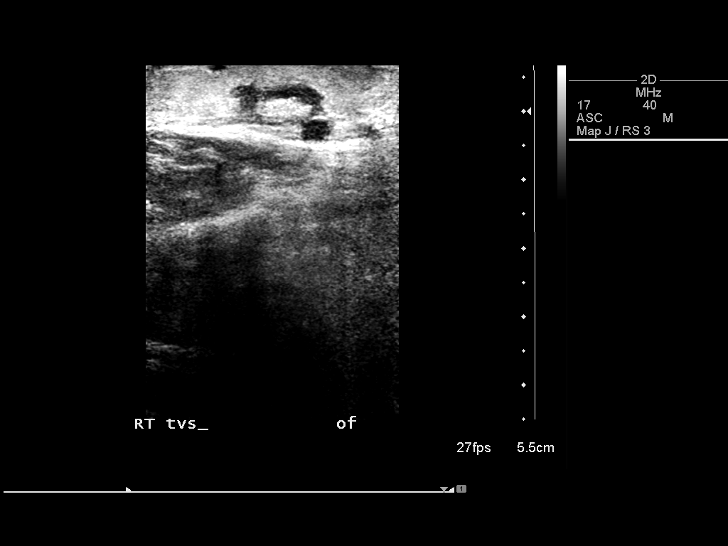
[im 30/30]
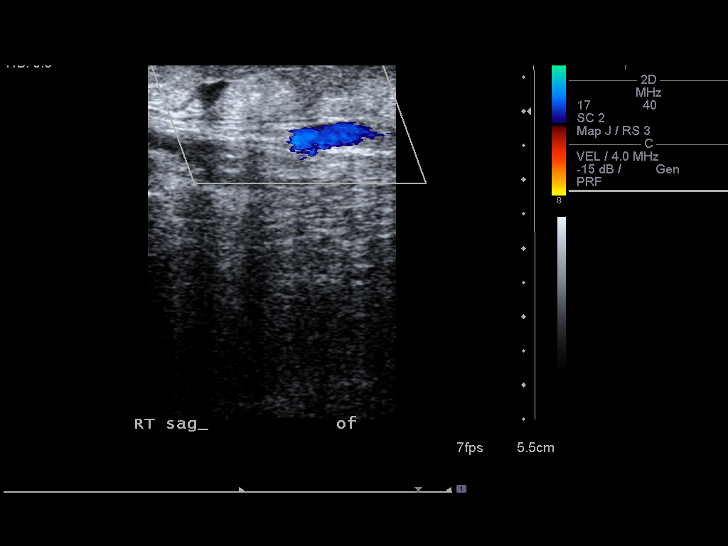

[14 of 24 positions shown; findings below may reference images not displayed]

FINDINGS: The lower extremity deep venous system demonstrates normal
compressibility, phasicity, and augmentation. Posterior tibial vein
unremarkable.  No evidence of DVT. There is a thrombosed superficial vein   in
the posterior subcutaneous tissues at the right calf corresponding to region of
symptomatology.
IMPRESSION: 1. Negative for right lower extremity DVT.
2. Posterior right calf thrombophlebitis.

## 2006-06-29 ENCOUNTER — Ambulatory Visit: Payer: Self-pay | Admitting: Internal Medicine

## 2006-06-29 ENCOUNTER — Encounter: Admission: RE | Admit: 2006-06-29 | Discharge: 2006-06-29 | Payer: Self-pay | Admitting: Internal Medicine

## 2006-06-29 LAB — CONVERTED CEMR LAB
CD4 Count: 70 microliters
HIV-1 RNA Quant, Log: <1.7 (ref <1.70)

## 2006-06-30 LAB — CONVERTED CEMR LAB
ALT: 23 units/L (ref 0–35)
AST: 29 units/L (ref 0–37)
Alkaline Phosphatase: 90 units/L (ref 39–117)
BUN: 23 mg/dL (ref 6–23)
Basophils Absolute: 0 10*3/uL (ref 0.0–0.1)
Basophils Relative: 1 % (ref 0–1)
Chloride: 98 meq/L (ref 96–112)
Creatinine, Ser: 6.99 mg/dL — ABNORMAL HIGH (ref 0.40–1.20)
Eosinophils Absolute: 0.1 10*3/uL (ref 0.0–0.7)
Hemoglobin: 13.6 g/dL (ref 12.0–15.0)
MCHC: 30 g/dL (ref 30.0–36.0)
MCV: 85.6 fL (ref 78.0–100.0)
Monocytes Absolute: 0.6 10*3/uL (ref 0.2–0.7)
Neutro Abs: 2.3 10*3/uL (ref 1.7–7.7)
RDW: 18.2 % — ABNORMAL HIGH (ref 11.5–14.0)
Total Bilirubin: 0.8 mg/dL (ref 0.3–1.2)

## 2006-07-13 ENCOUNTER — Encounter: Admission: RE | Admit: 2006-07-13 | Discharge: 2006-07-13 | Payer: Self-pay | Admitting: Internal Medicine

## 2006-07-13 ENCOUNTER — Ambulatory Visit: Payer: Self-pay | Admitting: Internal Medicine

## 2006-07-13 ENCOUNTER — Emergency Department (HOSPITAL_COMMUNITY): Admission: EM | Admit: 2006-07-13 | Discharge: 2006-07-14 | Payer: Self-pay | Admitting: Emergency Medicine

## 2006-07-13 DIAGNOSIS — Z862 Personal history of diseases of the blood and blood-forming organs and certain disorders involving the immune mechanism: Secondary | ICD-10-CM | POA: Insufficient documentation

## 2006-07-13 DIAGNOSIS — Z8639 Personal history of other endocrine, nutritional and metabolic disease: Secondary | ICD-10-CM

## 2006-07-16 ENCOUNTER — Emergency Department (HOSPITAL_COMMUNITY): Admission: EM | Admit: 2006-07-16 | Discharge: 2006-07-16 | Payer: Self-pay | Admitting: Emergency Medicine

## 2006-07-18 ENCOUNTER — Emergency Department (HOSPITAL_COMMUNITY): Admission: EM | Admit: 2006-07-18 | Discharge: 2006-07-18 | Payer: Self-pay | Admitting: Emergency Medicine

## 2006-07-20 ENCOUNTER — Emergency Department (HOSPITAL_COMMUNITY): Admission: EM | Admit: 2006-07-20 | Discharge: 2006-07-20 | Payer: Self-pay | Admitting: Emergency Medicine

## 2006-08-17 ENCOUNTER — Ambulatory Visit: Payer: Self-pay | Admitting: Internal Medicine

## 2006-09-20 ENCOUNTER — Encounter (INDEPENDENT_AMBULATORY_CARE_PROVIDER_SITE_OTHER): Payer: Self-pay | Admitting: *Deleted

## 2006-10-06 ENCOUNTER — Emergency Department (HOSPITAL_COMMUNITY): Admission: EM | Admit: 2006-10-06 | Discharge: 2006-10-06 | Payer: Self-pay | Admitting: Emergency Medicine

## 2006-10-21 ENCOUNTER — Emergency Department (HOSPITAL_COMMUNITY): Admission: EM | Admit: 2006-10-21 | Discharge: 2006-10-21 | Payer: Self-pay | Admitting: Emergency Medicine

## 2006-11-02 ENCOUNTER — Encounter: Payer: Self-pay | Admitting: Internal Medicine

## 2006-11-02 ENCOUNTER — Encounter: Admission: RE | Admit: 2006-11-02 | Discharge: 2006-11-02 | Payer: Self-pay | Admitting: Internal Medicine

## 2006-11-02 ENCOUNTER — Ambulatory Visit: Payer: Self-pay | Admitting: Internal Medicine

## 2006-11-02 LAB — CONVERTED CEMR LAB
HIV 1 RNA Quant: 362 copies/mL — ABNORMAL HIGH (ref <50)
HIV-1 RNA Quant, Log: 2.56 — ABNORMAL HIGH (ref <1.70)

## 2006-11-04 LAB — CONVERTED CEMR LAB
AST: 21 units/L (ref 0–37)
Albumin: 2.6 g/dL — ABNORMAL LOW (ref 3.5–5.2)
BUN: 29 mg/dL — ABNORMAL HIGH (ref 6–23)
CO2: 22 meq/L (ref 19–32)
Calcium: 7.4 mg/dL — ABNORMAL LOW (ref 8.4–10.5)
Chloride: 103 meq/L (ref 96–112)
Creatinine, Ser: 7.52 mg/dL — ABNORMAL HIGH (ref 0.40–1.20)
Glucose, Bld: 51 mg/dL — ABNORMAL LOW (ref 70–99)
Hemoglobin: 11.9 g/dL — ABNORMAL LOW (ref 12.0–15.0)
Lymphs Abs: 1.2 10*3/uL (ref 0.7–3.3)
MCHC: 28.9 g/dL — ABNORMAL LOW (ref 30.0–36.0)
Monocytes Absolute: 0.7 10*3/uL (ref 0.2–0.7)
Monocytes Relative: 17 % — ABNORMAL HIGH (ref 3–11)
Neutro Abs: 1.9 10*3/uL (ref 1.7–7.7)
Neutrophils Relative %: 50 % (ref 43–77)
Potassium: 3.7 meq/L (ref 3.5–5.3)
RBC: 4.71 M/uL (ref 3.87–5.11)
WBC: 3.8 10*3/uL — ABNORMAL LOW (ref 4.0–10.5)

## 2006-11-15 ENCOUNTER — Encounter (INDEPENDENT_AMBULATORY_CARE_PROVIDER_SITE_OTHER): Payer: Self-pay | Admitting: *Deleted

## 2006-11-16 ENCOUNTER — Ambulatory Visit: Payer: Self-pay | Admitting: Internal Medicine

## 2006-11-16 DIAGNOSIS — J209 Acute bronchitis, unspecified: Secondary | ICD-10-CM | POA: Insufficient documentation

## 2006-12-02 IMAGING — CR DG ABDOMEN 2V
2 series · 2 of 2 positions shown · non-contrast
Comparison: [DATE].

CLINICAL DATA: 55-year-old hiatal hernia, vomiting, dialysis patient.
 ABDOMEN ? 2 VIEW:

[view not recorded (1 of 2)]
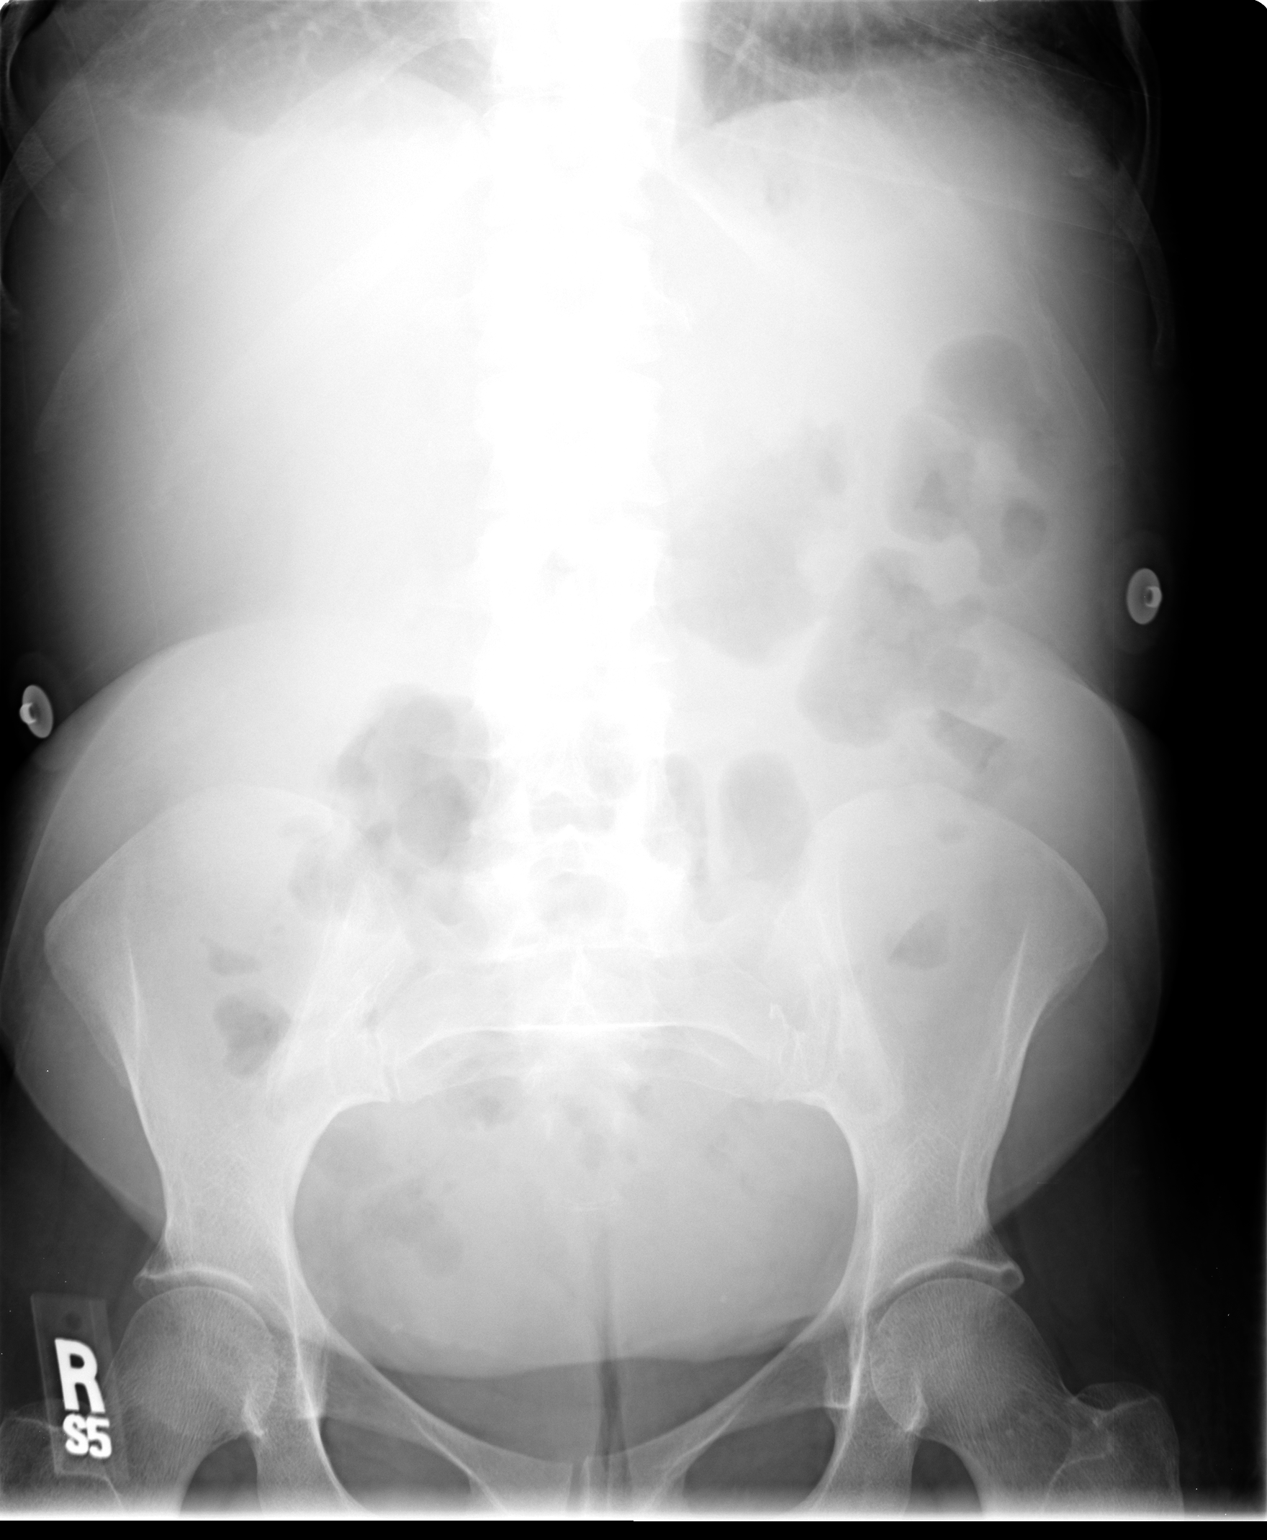

[view not recorded (2 of 2)]
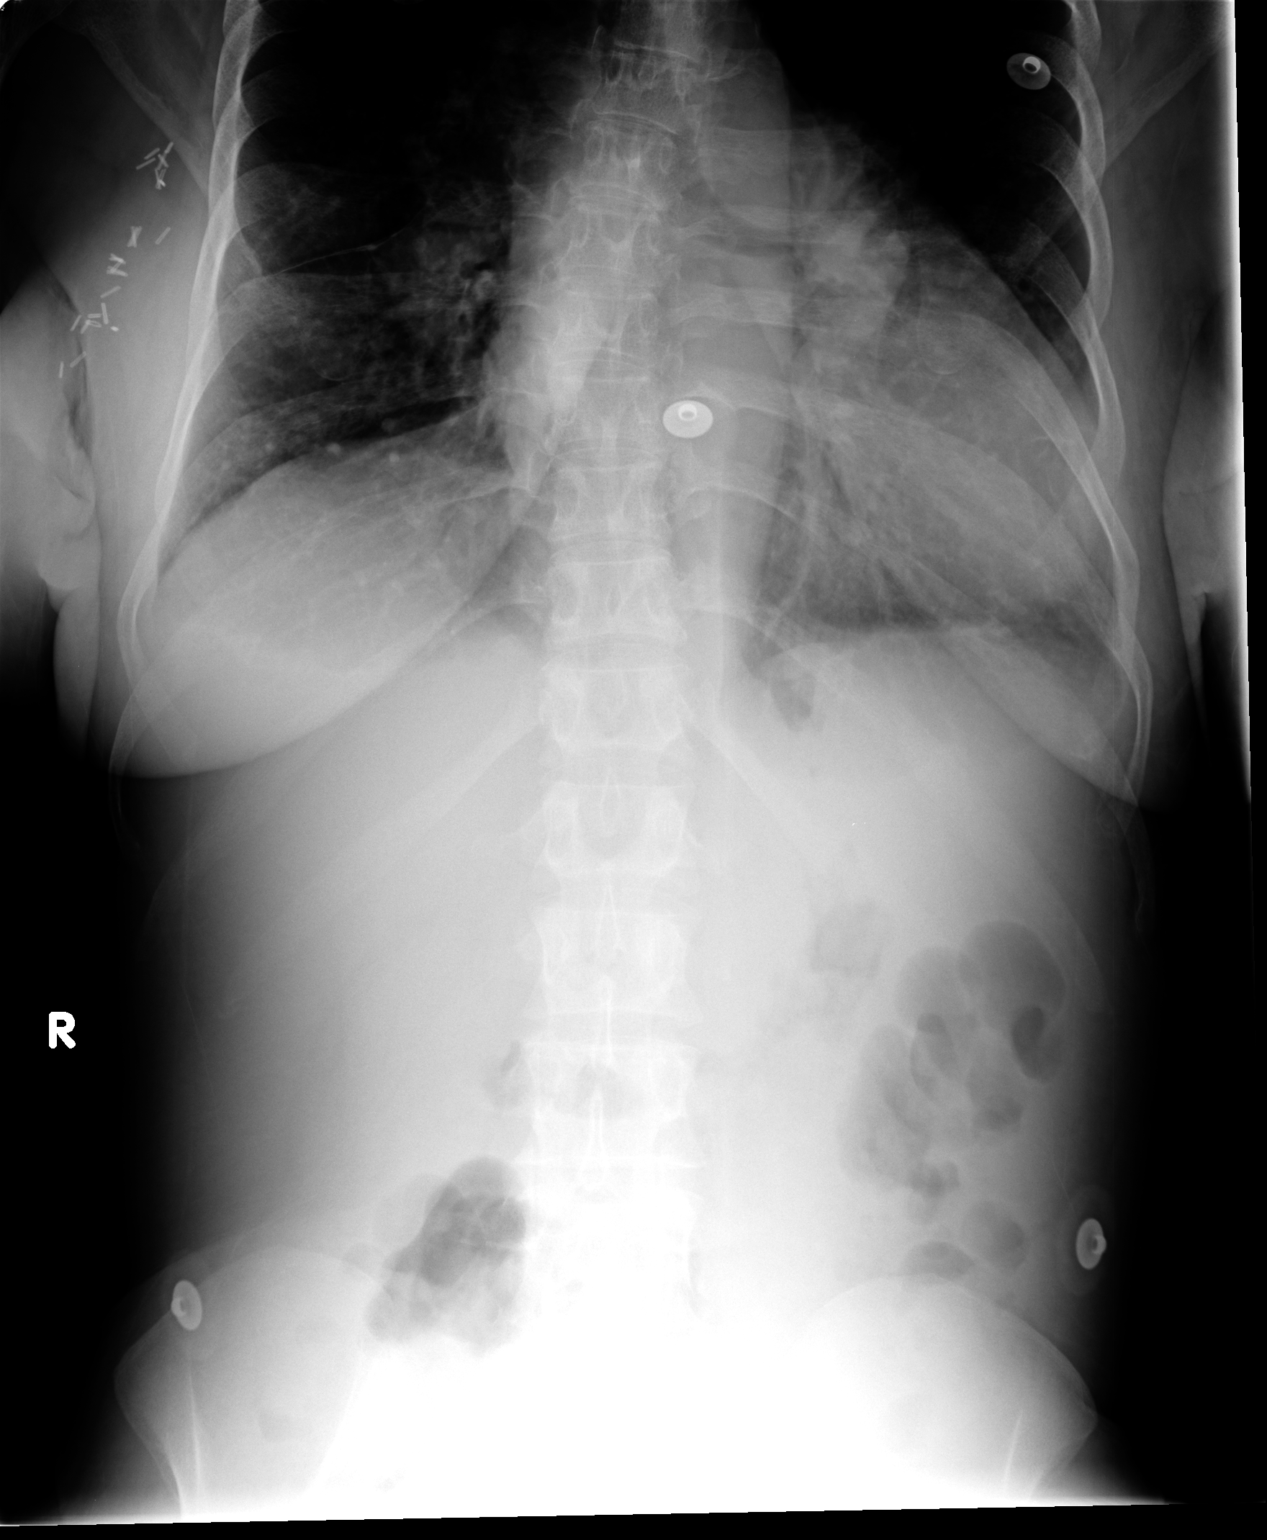

[2 of 2 positions shown; findings below may reference images not displayed]

FINDINGS: Exam is limited due to slight under penetration.  There is scattered air in the colon.  No dilated loops of small bowel to suggest obstruction.  Diffuse ground-glass opacity may suggest diffuse ascites.  Soft tissue shadows of the abdomen are grossly maintained.  The lung bases are grossly clear.
IMPRESSION: 1.  Nonobstructive bowel gas pattern.  No definite free air. 
 2.  Probable ascites.

## 2006-12-02 IMAGING — CR DG CHEST 2V
2 series · 2 of 2 positions shown · non-contrast
Comparison: [DATE].

CLINICAL DATA: Intermittent cough for months, worse last few days.  Shortness of breath.  Wheezing.  Emergency Department patient.
 CHEST - 2 VIEWS:

[view not recorded (1 of 2)]
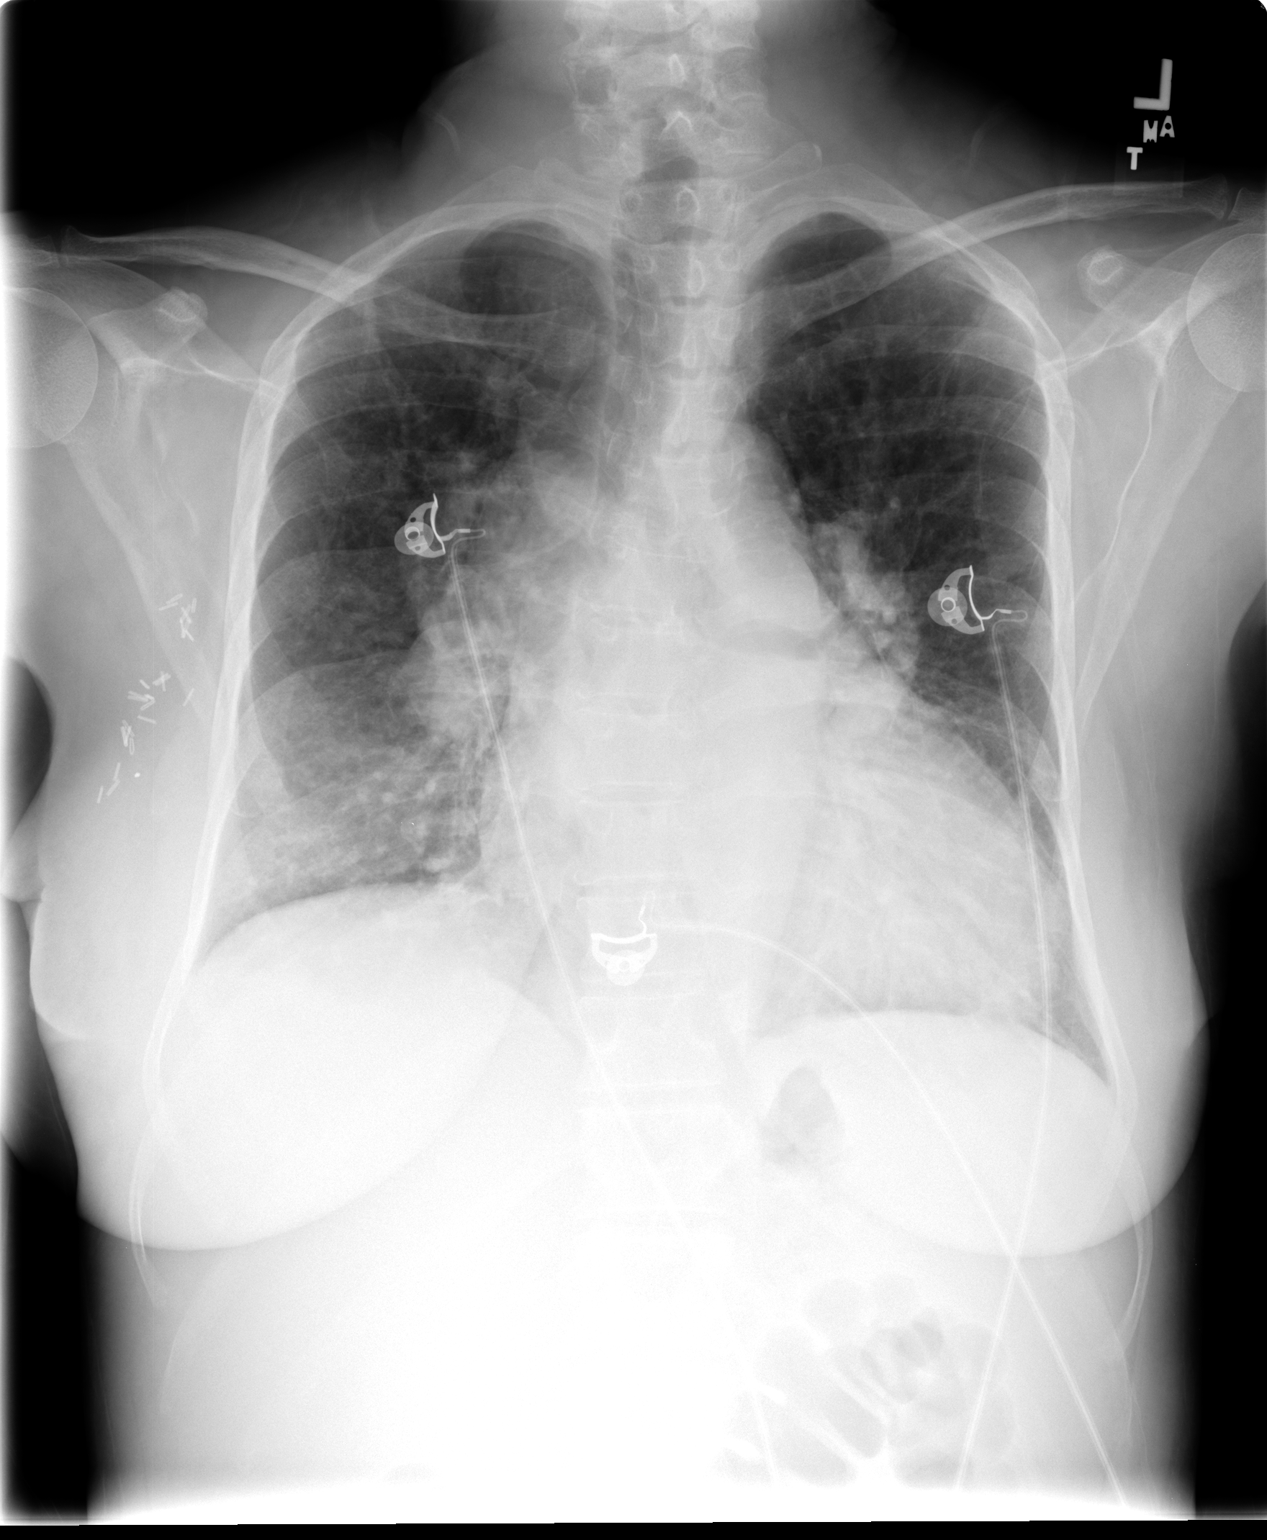

[view not recorded (2 of 2)]
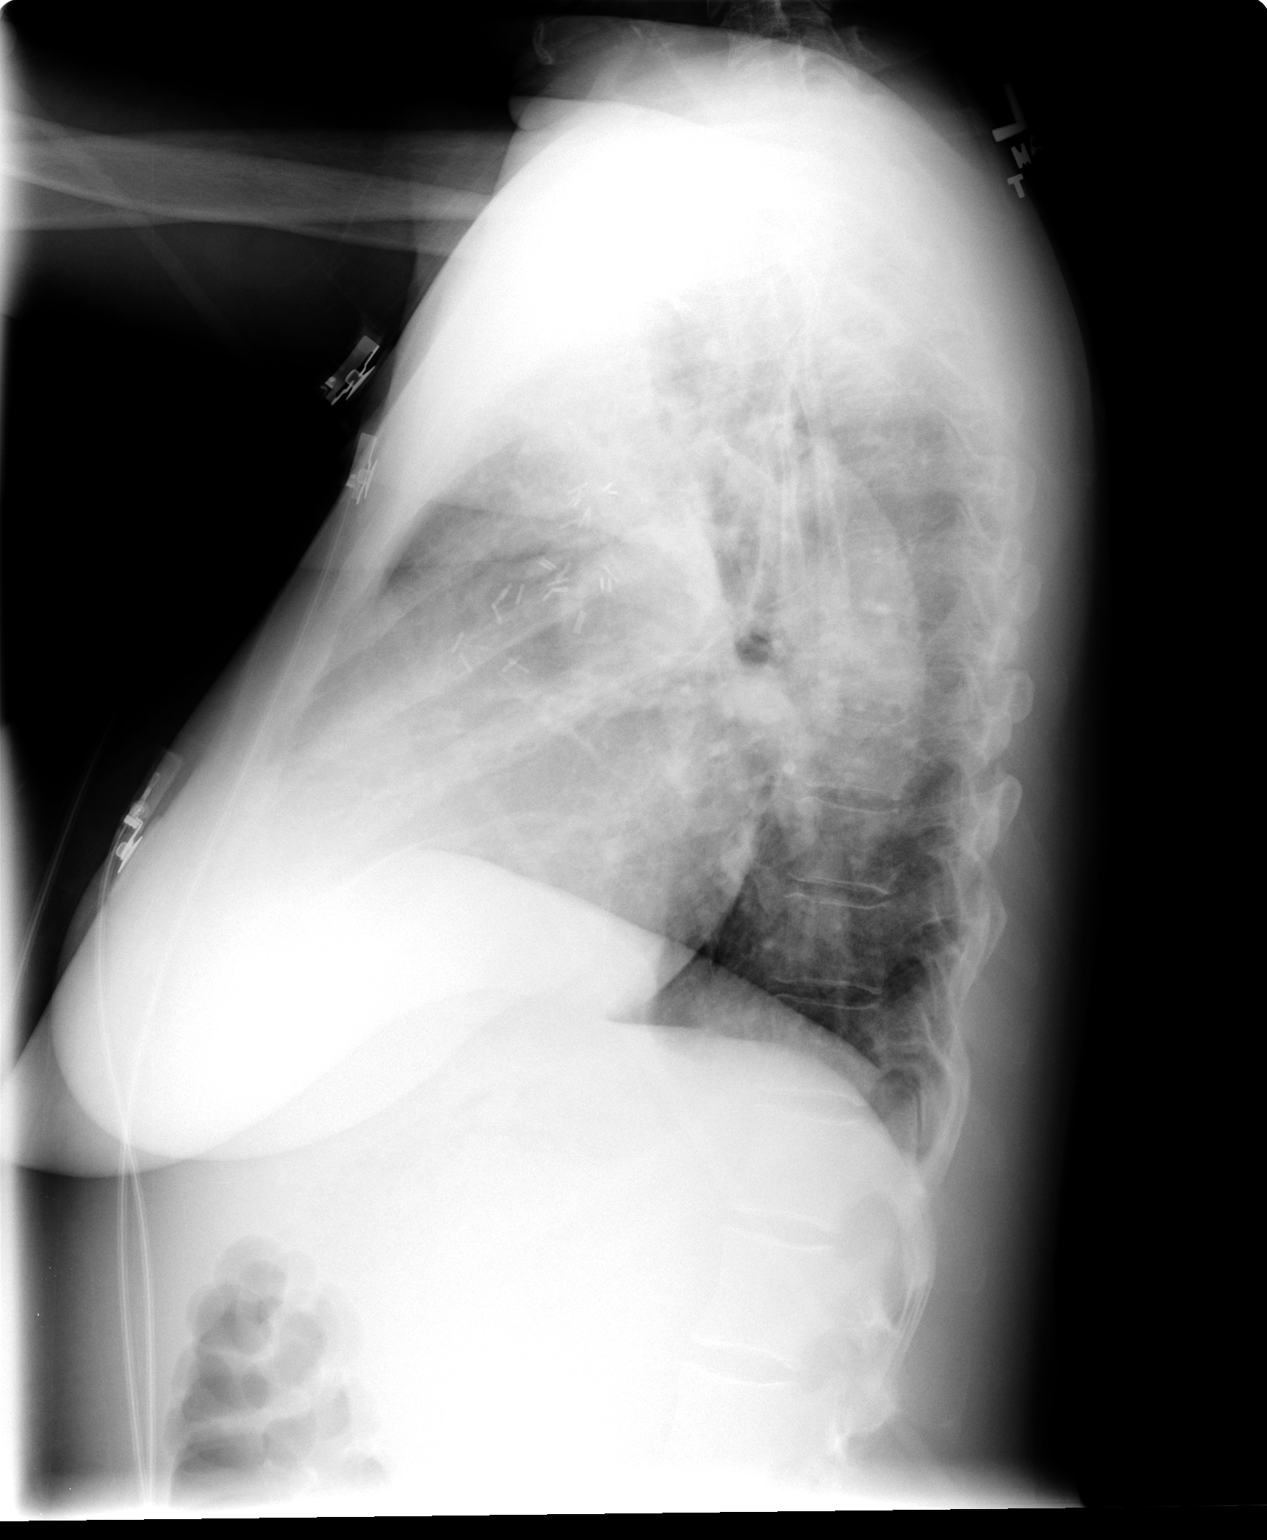

[2 of 2 positions shown; findings below may reference images not displayed]

FINDINGS: Cardiomegaly with multichamber enlargement.  Pulmonary vascular congestion.  Distended azygos vein.  Enlarged pulmonary arteries and potentially adenopathy as well.  Minimal perivascular edema may be present.  No air space edema.  Negative for pneumonia.
IMPRESSION: Findings compatible with mild CHF.  PAH.  No focal infiltrates.

## 2006-12-03 ENCOUNTER — Inpatient Hospital Stay (HOSPITAL_COMMUNITY): Admission: EM | Admit: 2006-12-03 | Discharge: 2006-12-06 | Payer: Self-pay | Admitting: Emergency Medicine

## 2006-12-24 ENCOUNTER — Ambulatory Visit: Payer: Self-pay | Admitting: Internal Medicine

## 2006-12-24 LAB — CONVERTED CEMR LAB

## 2007-02-15 ENCOUNTER — Ambulatory Visit: Payer: Self-pay | Admitting: Internal Medicine

## 2007-02-15 ENCOUNTER — Encounter: Admission: RE | Admit: 2007-02-15 | Discharge: 2007-02-15 | Payer: Self-pay | Admitting: Internal Medicine

## 2007-02-15 ENCOUNTER — Telehealth: Payer: Self-pay | Admitting: Internal Medicine

## 2007-02-16 LAB — CONVERTED CEMR LAB
ALT: 10 units/L (ref 0–35)
AST: 21 units/L (ref 0–37)
Basophils Absolute: 0 10*3/uL (ref 0.0–0.1)
Calcium: 7.9 mg/dL — ABNORMAL LOW (ref 8.4–10.5)
Chloride: 102 meq/L (ref 96–112)
Creatinine, Ser: 7.72 mg/dL — ABNORMAL HIGH (ref 0.40–1.20)
Eosinophils Relative: 1 % (ref 0–5)
HCT: 41.1 % (ref 36.0–46.0)
Lymphocytes Relative: 40 % (ref 12–46)
Lymphs Abs: 1.8 10*3/uL (ref 0.7–4.0)
Neutrophils Relative %: 46 % (ref 43–77)
Platelets: 87 10*3/uL — ABNORMAL LOW (ref 150–400)
RDW: 20.3 % — ABNORMAL HIGH (ref 11.5–15.5)
Sodium: 135 meq/L (ref 135–145)
Total Bilirubin: 0.8 mg/dL (ref 0.3–1.2)
Total Protein: 12.1 g/dL — ABNORMAL HIGH (ref 6.0–8.3)
WBC: 4.5 10*3/uL (ref 4.0–10.5)

## 2007-03-01 ENCOUNTER — Encounter (INDEPENDENT_AMBULATORY_CARE_PROVIDER_SITE_OTHER): Payer: Self-pay | Admitting: *Deleted

## 2007-03-24 ENCOUNTER — Ambulatory Visit (HOSPITAL_COMMUNITY): Admission: RE | Admit: 2007-03-24 | Discharge: 2007-03-24 | Payer: Self-pay | Admitting: Internal Medicine

## 2007-04-05 ENCOUNTER — Encounter: Admission: RE | Admit: 2007-04-05 | Discharge: 2007-04-05 | Payer: Self-pay | Admitting: Internal Medicine

## 2007-04-05 ENCOUNTER — Ambulatory Visit: Payer: Self-pay | Admitting: Internal Medicine

## 2007-04-06 LAB — CONVERTED CEMR LAB
AST: 14 units/L (ref 0–37)
Alkaline Phosphatase: 70 units/L (ref 39–117)
BUN: 22 mg/dL (ref 6–23)
Basophils Relative: 1 % (ref 0–1)
Calcium: 7.9 mg/dL — ABNORMAL LOW (ref 8.4–10.5)
Creatinine, Ser: 6.67 mg/dL — ABNORMAL HIGH (ref 0.40–1.20)
Eosinophils Absolute: 0 10*3/uL (ref 0.0–0.7)
HDL: 30 mg/dL — ABNORMAL LOW (ref >39)
Hemoglobin: 11.2 g/dL — ABNORMAL LOW (ref 12.0–15.0)
LDL Cholesterol: 20 mg/dL (ref 0–99)
MCHC: 30.4 g/dL (ref 30.0–36.0)
MCV: 85.6 fL (ref 78.0–100.0)
Monocytes Absolute: 0.7 10*3/uL (ref 0.1–1.0)
Monocytes Relative: 15 % — ABNORMAL HIGH (ref 3–12)
RBC: 4.31 M/uL (ref 3.87–5.11)
RDW: 21.2 % — ABNORMAL HIGH (ref 11.5–15.5)
Total Bilirubin: 0.9 mg/dL (ref 0.3–1.2)
Total CHOL/HDL Ratio: 1.9
VLDL: 8 mg/dL (ref 0–40)

## 2007-04-26 ENCOUNTER — Encounter (INDEPENDENT_AMBULATORY_CARE_PROVIDER_SITE_OTHER): Payer: Self-pay | Admitting: *Deleted

## 2007-04-26 ENCOUNTER — Ambulatory Visit: Payer: Self-pay | Admitting: Internal Medicine

## 2007-04-26 DIAGNOSIS — K439 Ventral hernia without obstruction or gangrene: Secondary | ICD-10-CM | POA: Insufficient documentation

## 2007-07-26 ENCOUNTER — Ambulatory Visit: Payer: Self-pay | Admitting: Internal Medicine

## 2007-07-26 ENCOUNTER — Encounter: Admission: RE | Admit: 2007-07-26 | Discharge: 2007-07-26 | Payer: Self-pay | Admitting: Internal Medicine

## 2007-08-01 LAB — CONVERTED CEMR LAB
AST: 23 units/L (ref 0–37)
Albumin: 2.5 g/dL — ABNORMAL LOW (ref 3.5–5.2)
Alkaline Phosphatase: 55 units/L (ref 39–117)
BUN: 25 mg/dL — ABNORMAL HIGH (ref 6–23)
Basophils Relative: 1 % (ref 0–1)
Creatinine, Ser: 8.15 mg/dL — ABNORMAL HIGH (ref 0.40–1.20)
Eosinophils Absolute: 0.1 10*3/uL (ref 0.0–0.7)
MCHC: 30 g/dL (ref 30.0–36.0)
MCV: 80.2 fL (ref 78.0–100.0)
Monocytes Relative: 18 % — ABNORMAL HIGH (ref 3–12)
Neutrophils Relative %: 42 % — ABNORMAL LOW (ref 43–77)
Potassium: 4 meq/L (ref 3.5–5.3)
RBC: 4.99 M/uL (ref 3.87–5.11)

## 2007-08-03 ENCOUNTER — Emergency Department (HOSPITAL_COMMUNITY): Admission: EM | Admit: 2007-08-03 | Discharge: 2007-08-03 | Payer: Self-pay | Admitting: Emergency Medicine

## 2007-08-09 ENCOUNTER — Ambulatory Visit: Payer: Self-pay | Admitting: Internal Medicine

## 2007-11-17 ENCOUNTER — Ambulatory Visit: Payer: Self-pay | Admitting: Internal Medicine

## 2007-11-17 LAB — CONVERTED CEMR LAB
HIV 1 RNA Quant: 747 copies/mL — ABNORMAL HIGH (ref <50)
HIV-1 RNA Quant, Log: 2.87 — ABNORMAL HIGH (ref <1.70)

## 2007-11-18 LAB — CONVERTED CEMR LAB
Alkaline Phosphatase: 100 units/L (ref 39–117)
BUN: 21 mg/dL (ref 6–23)
Eosinophils Absolute: 0.1 10*3/uL (ref 0.0–0.7)
Eosinophils Relative: 1 % (ref 0–5)
Glucose, Bld: 69 mg/dL — ABNORMAL LOW (ref 70–99)
HCT: 38.2 % (ref 36.0–46.0)
Hemoglobin: 12 g/dL (ref 12.0–15.0)
Lymphs Abs: 1.6 10*3/uL (ref 0.7–4.0)
MCV: 86.2 fL (ref 78.0–100.0)
Monocytes Absolute: 0.6 10*3/uL (ref 0.1–1.0)
Monocytes Relative: 15 % — ABNORMAL HIGH (ref 3–12)
RBC: 4.43 M/uL (ref 3.87–5.11)
Total Bilirubin: 0.8 mg/dL (ref 0.3–1.2)
WBC: 3.7 10*3/uL — ABNORMAL LOW (ref 4.0–10.5)

## 2007-12-04 ENCOUNTER — Inpatient Hospital Stay (HOSPITAL_COMMUNITY): Admission: EM | Admit: 2007-12-04 | Discharge: 2007-12-08 | Payer: Self-pay | Admitting: Emergency Medicine

## 2007-12-11 ENCOUNTER — Inpatient Hospital Stay (HOSPITAL_COMMUNITY): Admission: EM | Admit: 2007-12-11 | Discharge: 2007-12-15 | Payer: Self-pay | Admitting: Emergency Medicine

## 2007-12-11 ENCOUNTER — Ambulatory Visit: Payer: Self-pay | Admitting: Cardiology

## 2007-12-11 IMAGING — CR DG CHEST 1V PORT
1 series · 1 of 1 positions shown · non-contrast
Comparison: [DATE]

CLINICAL DATA: Short of breath

PORTABLE CHEST - 1 VIEW

[view not recorded]
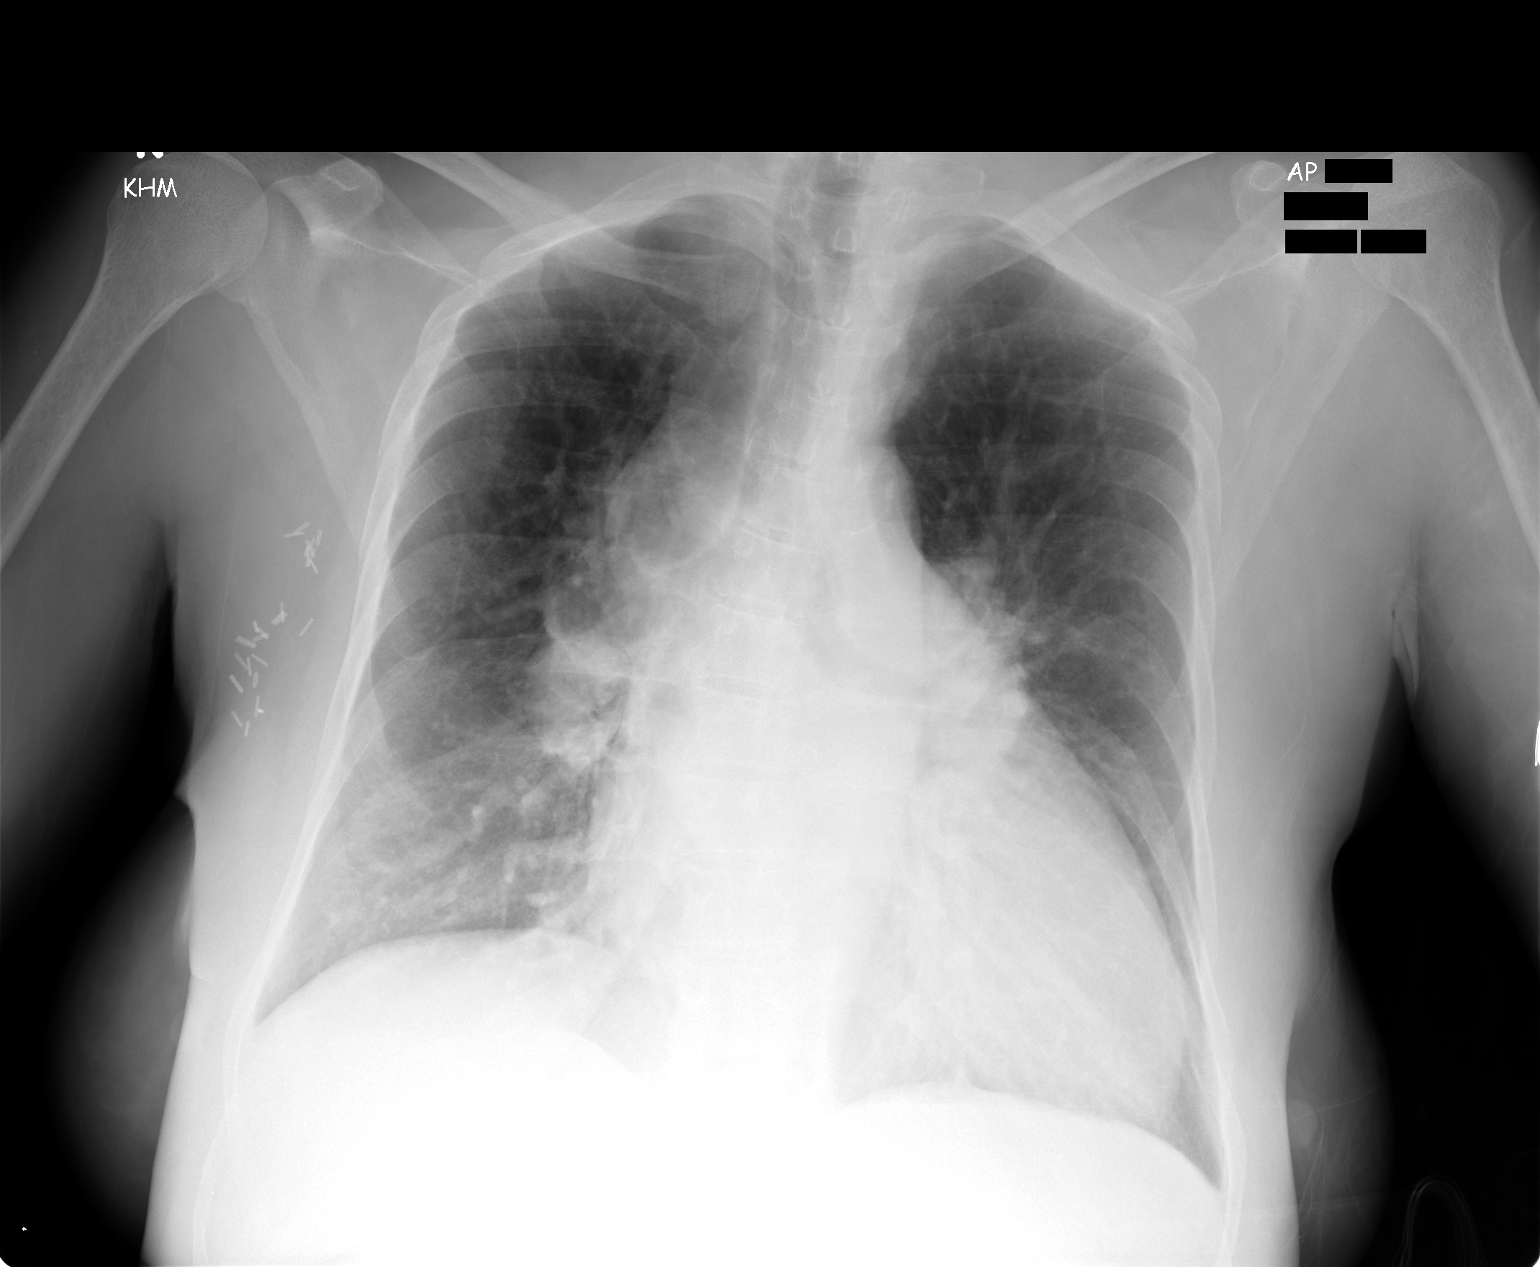

[1 of 1 positions shown; findings below may reference images not displayed]

FINDINGS: The heart is moderately enlarged.  Vascular congestion
without interstitial edema.  Right basilar atelectasis or scarring
is stable.  Prominent hilar regions bilaterally are stable.  The
patient has a history of mediastinal and hilar adenopathy.
IMPRESSION: Cardiomegaly without pulmonary edema.

Stable bilateral hilar prominence.  The patient does have a history
of documented hilar and mediastinal adenopathy.

## 2007-12-12 ENCOUNTER — Ambulatory Visit: Payer: Self-pay | Admitting: Internal Medicine

## 2007-12-12 IMAGING — CT CT ABDOMEN W/ CM
1 of 3 series · 13 of 32 positions shown, 18 images · IV contrast (agent unspecified)
Comparison: None

CT ABDOMEN

CLINICAL DATA: HIV, end-stage renal disease, diabetes,
hypertension, abdominal pain, diarrhea

CT ABDOMEN AND PELVIS WITH CONTRAST
TECHNIQUE: Multidetector CT imaging of the abdomen and pelvis was
performed using the standard protocol following bolus
administration of intravenous contrast. Sagittal and coronal MPR
images reconstructed from axial data set.
Contrast: 100 ml [V1]

[Series 2: abd_pel 5.0 b40f · axial · 0.69mm/px · z∈[-446,-61]mm · 13 of 89 slices shown, 18 images]
[im 6/89  soft-tissue]
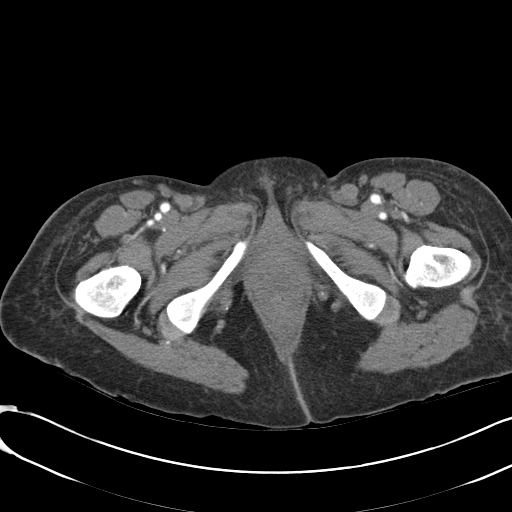
[im 6/89  bone]
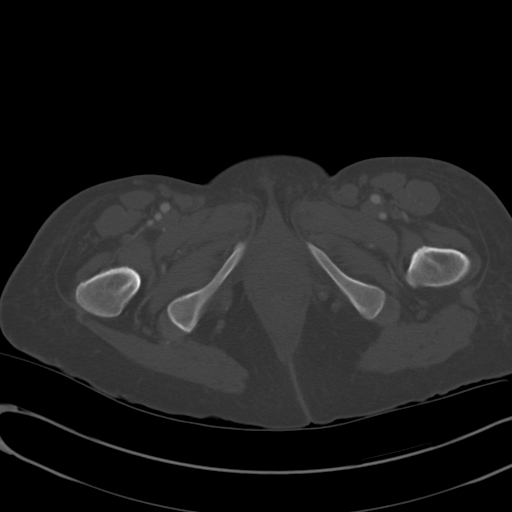
[im 16/89  soft-tissue]
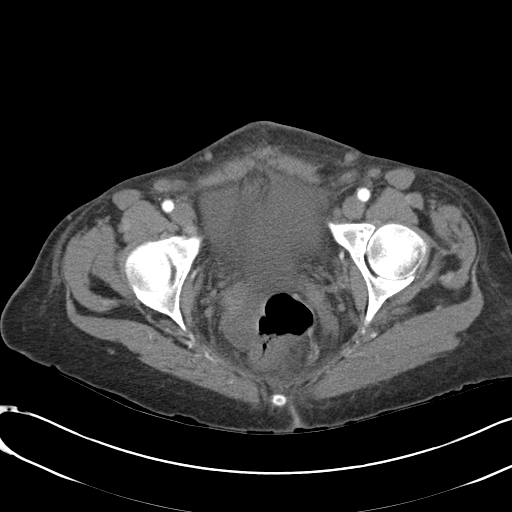
[im 21/89  soft-tissue]
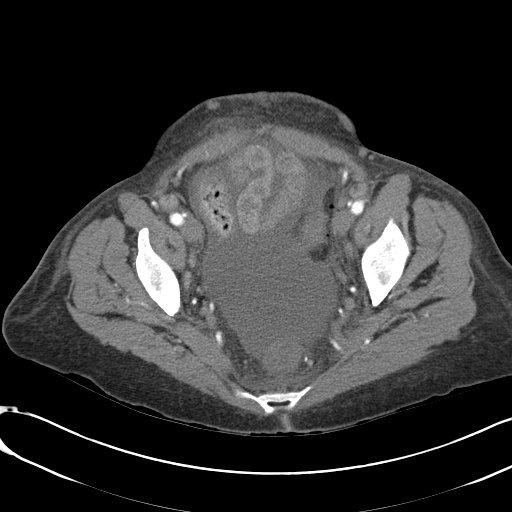
[im 26/89  soft-tissue]
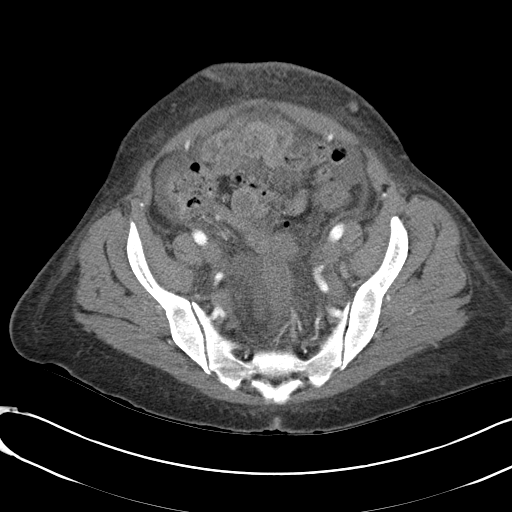
[im 37/89  soft-tissue]
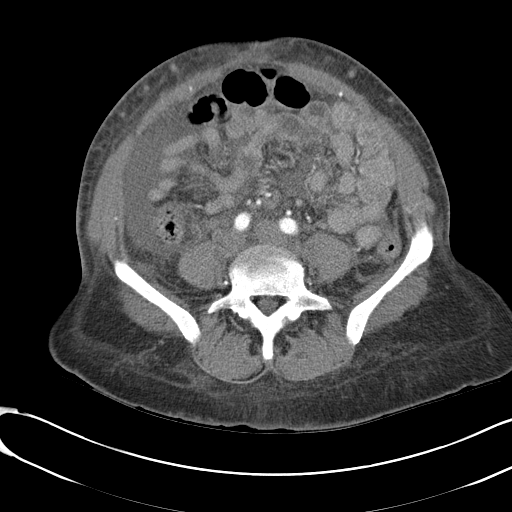
[im 42/89  soft-tissue]
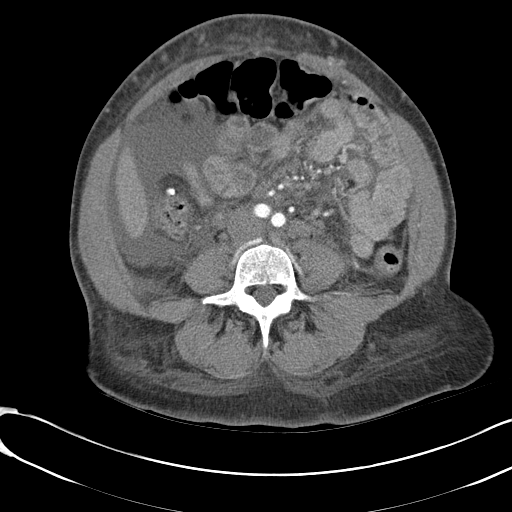
[im 47/89  soft-tissue]
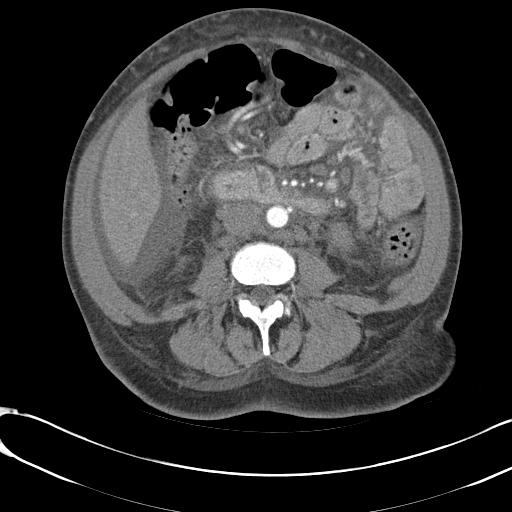
[im 57/89  soft-tissue]
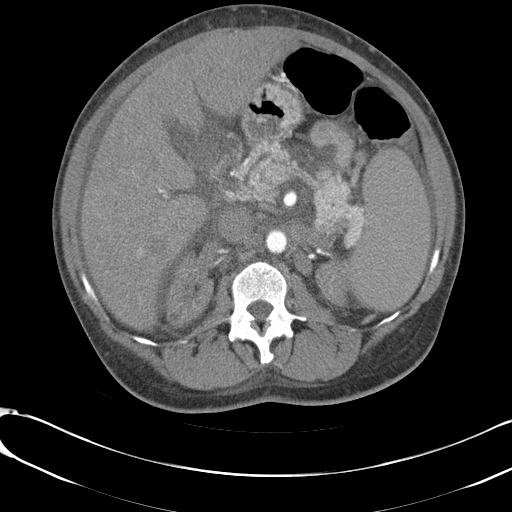
[im 63/89  soft-tissue]
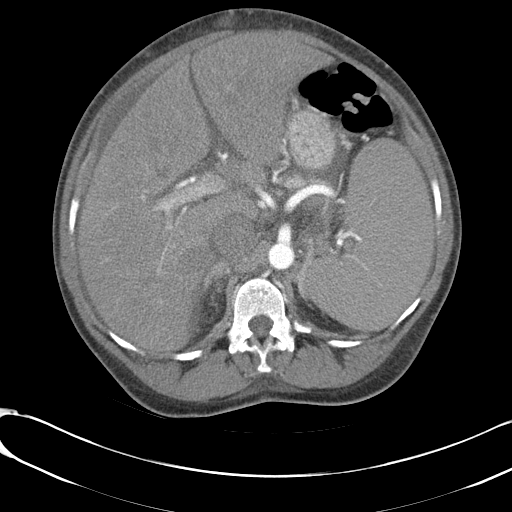
[im 63/89  bone]
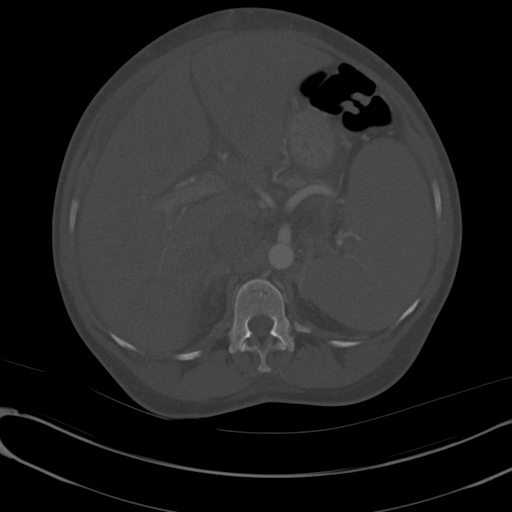
[im 68/89  soft-tissue]
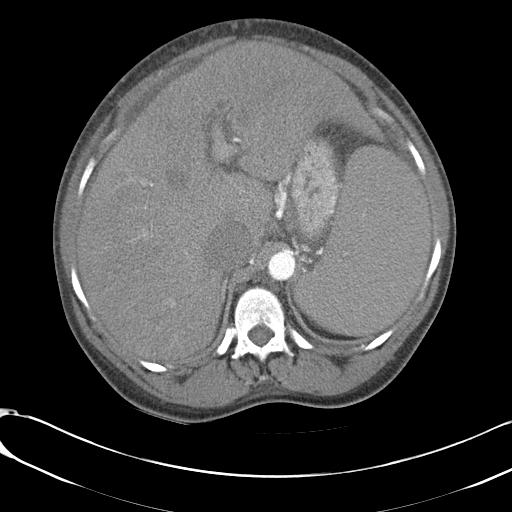
[im 68/89  lung]
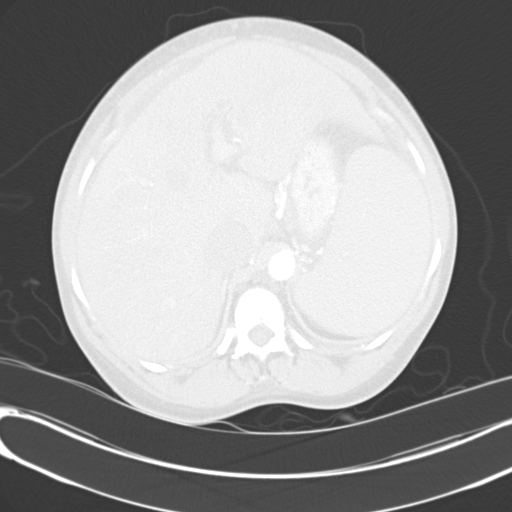
[im 73/89  lung]
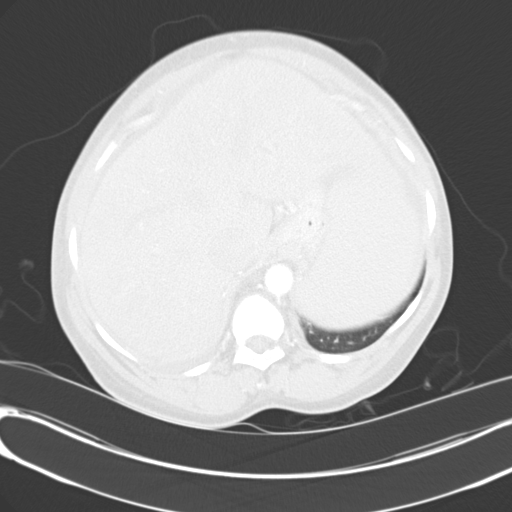
[im 78/89  soft-tissue]
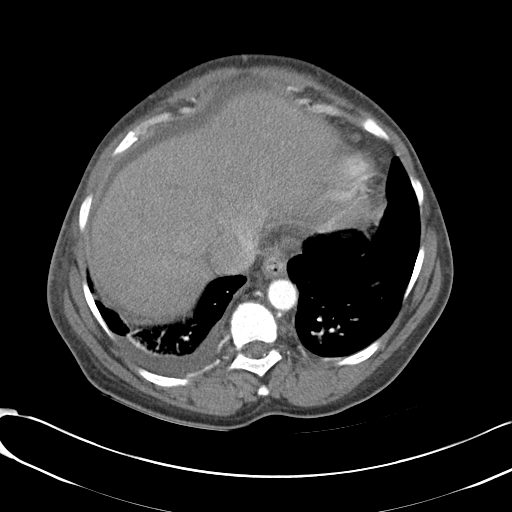
[im 78/89  lung]
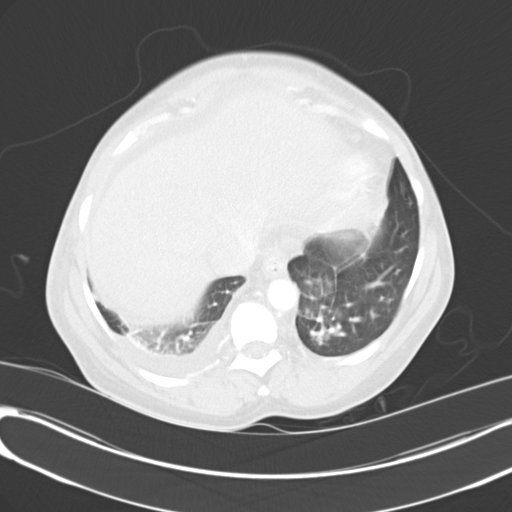
[im 83/89  soft-tissue]
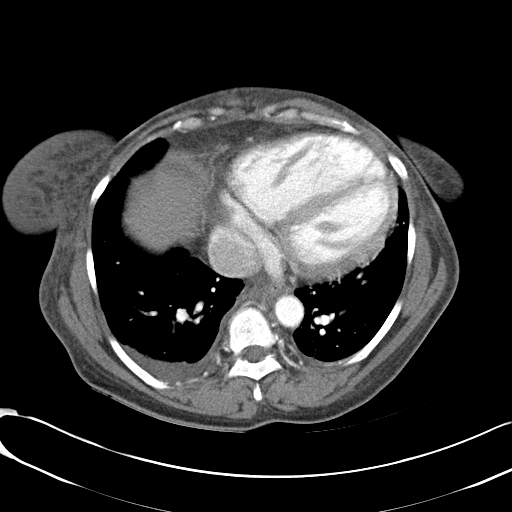
[im 83/89  lung]
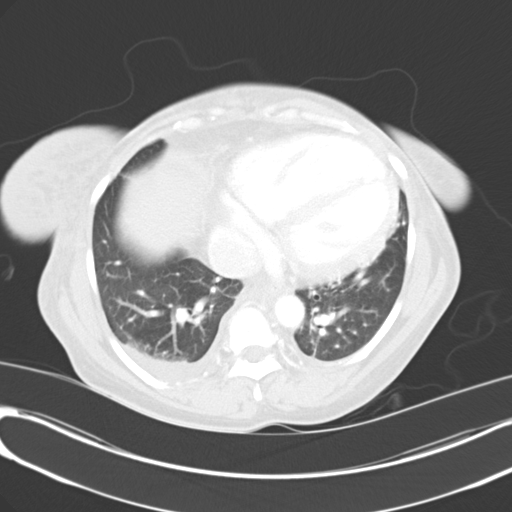

[13 of 32 positions shown; findings below may reference images not displayed]

FINDINGS: Small bibasilar pleural effusions and atelectasis.
Diffuse enlargement of the liver with IVC and hepatic venous
congestion/dilatation, question passive congestion.
Splenic enlargement, spleen, 13.7 x 7.3 x 13.7 cm.
Hepatic, portal, superior mesenteric, and splenic veins appear
patent.
Moderate ascites.
Atrophic kidneys compatible with end-stage renal disease, tiny
renal cyst bilaterally.
Vague area of questionable low attenuation at lateral aspect of the
right lobe liver, appreciated on delayed images, 3.0 x 1.5 cm image
21, nonspecific appearance.
Additional questionable area of low attenuation lateral aspect
right lobe liver, 2.5 by 1.0 cm image 9.
No additional focal mass lesions of liver, spleen, pancreas, or
adrenal glands.
Scattered normal-sized mesenteric and retroperitoneal nodes in
upper abdomen, nonspecific.
Small anterior bowel wall subcutaneous fluid collection question
cyst 2.3 cm image 28.
Small periumbilical hernia.
No bowel dilatation, bowel wall thickening or evidence of
obstruction.
IMPRESSION: Enlargement of liver and spleen with evidence of passive venous
congestion of the liver and significant ascites.
Vague peripheral areas of altered enhancement within liver, suspect
vascular in origin, though this can be confirmed with ultrasound to
exclude mass lesions.
Observed hepatic and splenic changes may be related to combination
of CHF and HIV related hepatitis.
Upper abdominal mesenteric and retroperitoneal adenopathy,
nonspecific, question related to HIV, known prior thoracic
adenopathy by CT chest of [DATE].
End-stage renal disease.

CT PELVIS
FINDINGS: Significant pelvic ascites with scattered soft tissue/abdominal
wall edema.
Small bowel loops normal appearance with bowel wall thickening of
distal descending and sigmoid colon suspicious for colitis, though
assessment bowel was limited by lack of GI contrast.
Iliac adenopathy on left.
Several small fluid collections in anterior abdominal wall near
umbilicus and upper pelvis, uncertain significance.
No intrapelvic mass or inguinal hernia.
Bladder and distal ureters normal.
No bony abnormalities.
IMPRESSION: Pelvic ascites and adenopathy, see CT abdomen report.
Small periumbilical hernia.

## 2007-12-13 ENCOUNTER — Encounter (INDEPENDENT_AMBULATORY_CARE_PROVIDER_SITE_OTHER): Payer: Self-pay | Admitting: Pulmonary Disease

## 2007-12-14 ENCOUNTER — Ambulatory Visit: Payer: Self-pay | Admitting: Gastroenterology

## 2008-01-04 ENCOUNTER — Emergency Department (HOSPITAL_COMMUNITY): Admission: EM | Admit: 2008-01-04 | Discharge: 2008-01-04 | Payer: Self-pay | Admitting: Emergency Medicine

## 2008-01-12 ENCOUNTER — Ambulatory Visit (HOSPITAL_COMMUNITY): Admission: RE | Admit: 2008-01-12 | Discharge: 2008-01-12 | Payer: Self-pay | Admitting: Pulmonary Disease

## 2008-01-12 IMAGING — CR DG CHEST 2V
2 series · 2 of 2 positions shown · non-contrast
Comparison: [DATE]

CLINICAL DATA: Cough

CHEST - 2 VIEW

[view not recorded (1 of 2)]
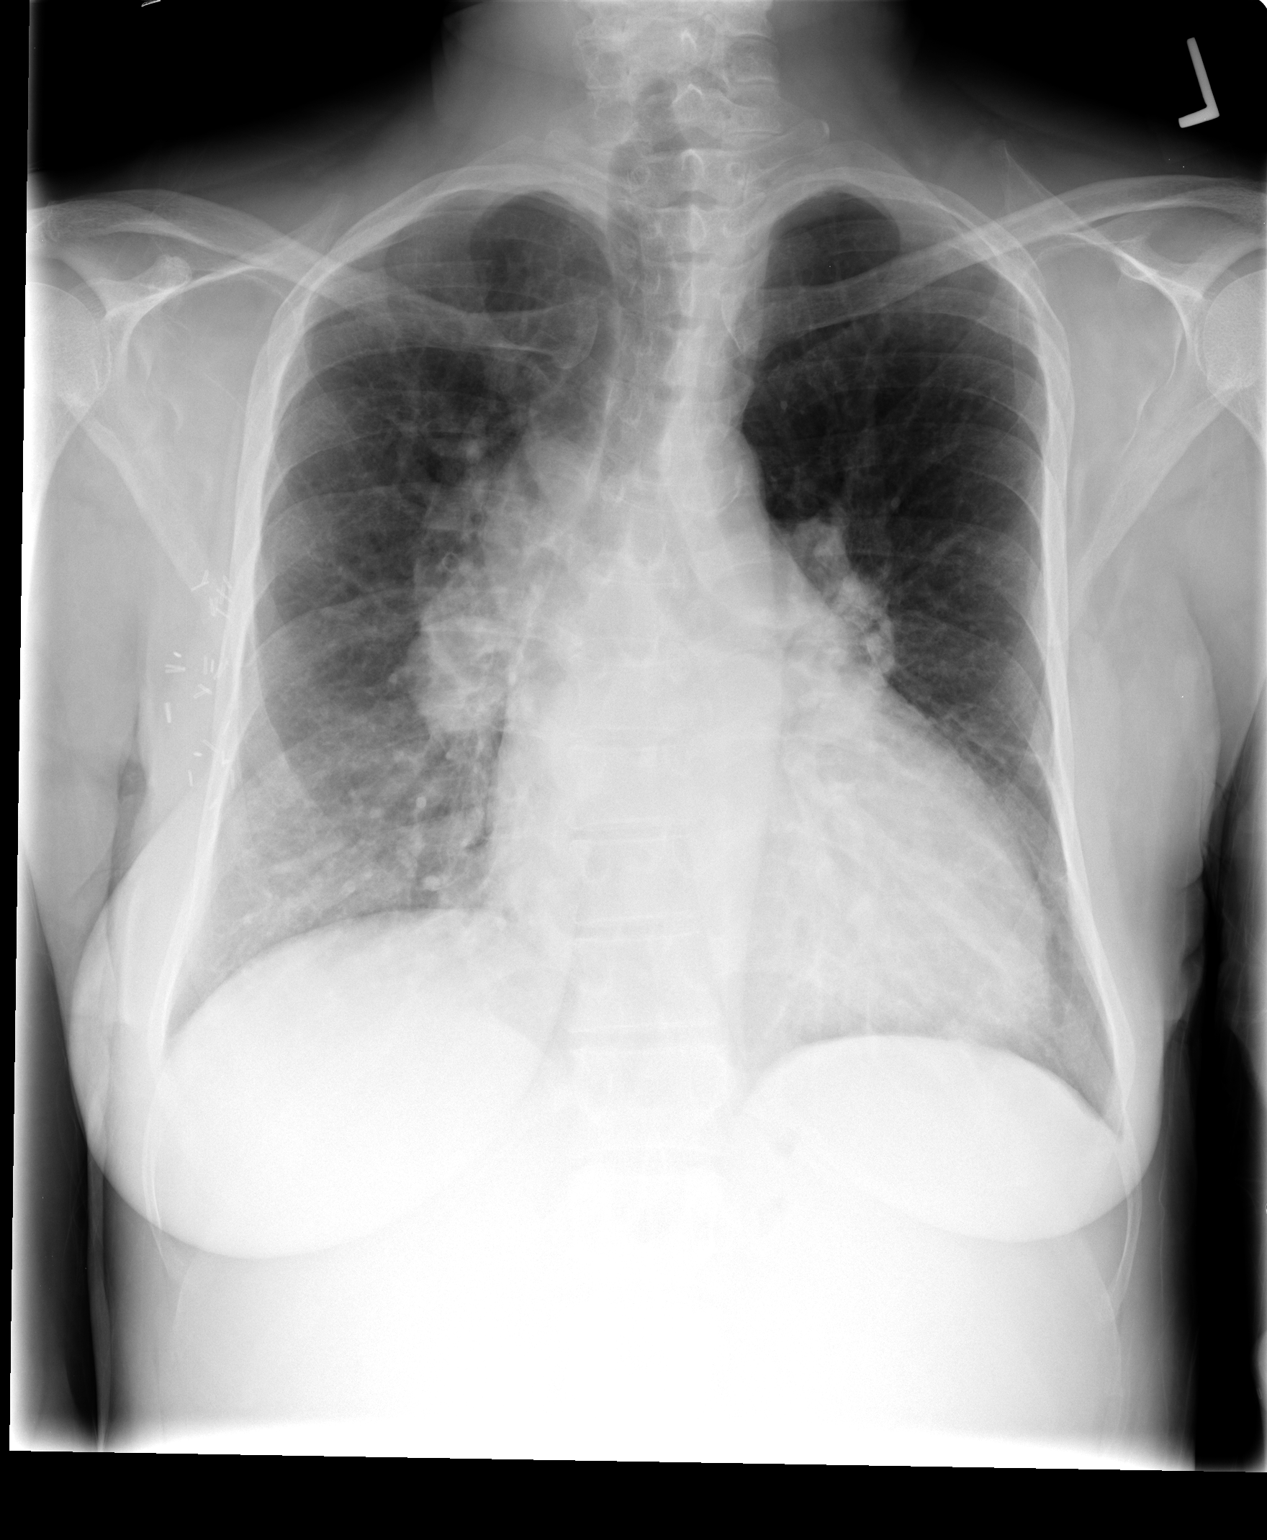

[view not recorded (2 of 2)]
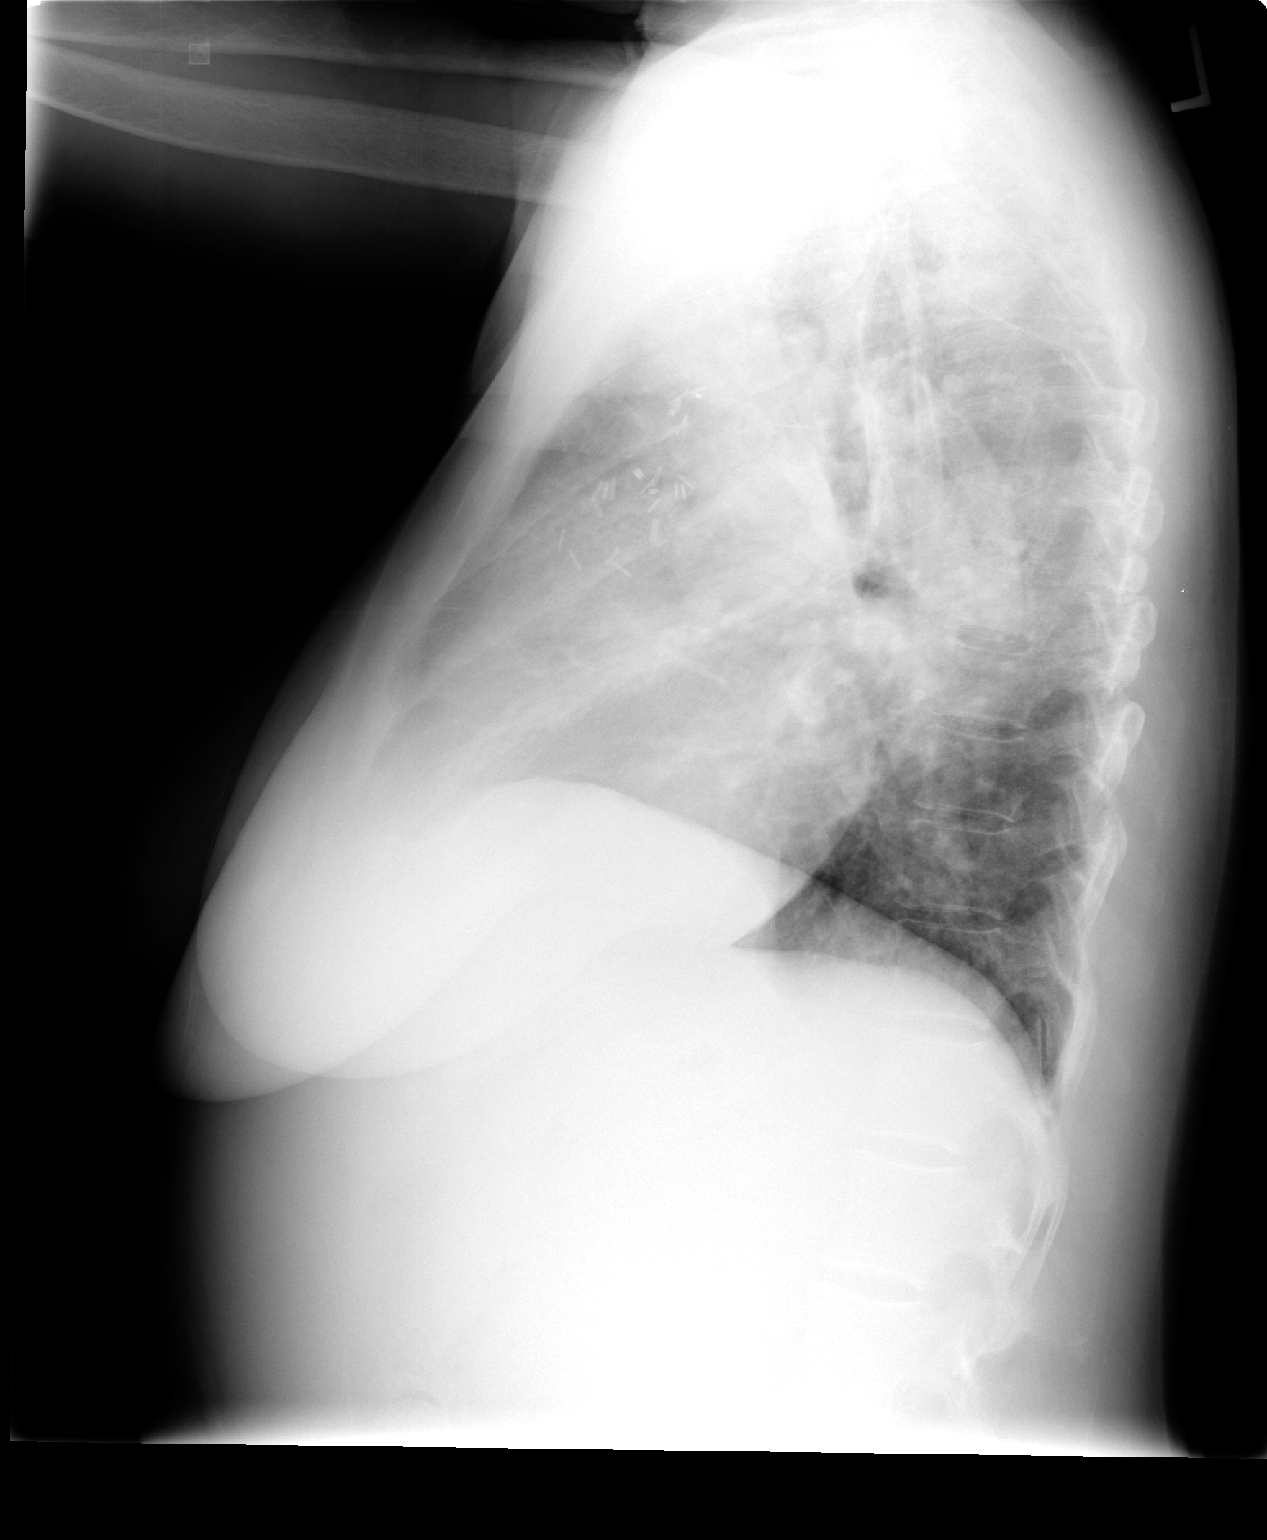

[2 of 2 positions shown; findings below may reference images not displayed]

FINDINGS: Cardiac enlargement.
Atherosclerotic calcifications aortic arch.
Marked bilateral hilar enlargement right greater than left,
slightly increased in prominence since previous study, suspect
enlarged pulmonary arteries and pulmonary arterial hypertension
with question of concomitant hilar adenopathy.
No pulmonary infiltrate or pleural effusion.
Scattered peribronchial thickening.
Surgical clips right axilla.
IMPRESSION: Cardiomegaly with pulmonary venous hypertension.
Bilateral hilar enlargement, question due to enlarged central
pulmonary arteries and potentially a accompanying hilar lymph node
enlargement.
Recommend dedicated CT chest with contrast to exclude hilar
adenopathy.

## 2008-02-02 ENCOUNTER — Ambulatory Visit (HOSPITAL_COMMUNITY): Admission: RE | Admit: 2008-02-02 | Discharge: 2008-02-02 | Payer: Self-pay | Admitting: Pulmonary Disease

## 2008-02-02 IMAGING — CT CT CHEST W/ CM
1 of 2 series · 15 of 31 positions shown, 19 images · IV contrast (Omnipaque 300)
Comparison: CT [DATE]

CLINICAL DATA: Large lymph nodes. Reportedly positive HIV; on
dialysis.

CT CHEST WITH CONTRAST
TECHNIQUE: Multidetector CT imaging of the chest was performed
following the standard protocol during bolus administration of
intravenous contrast.
Contrast: 80 ml [I7] IV.

[Series 3: chestroutine 5.0 b40f · axial · 0.57mm/px · z∈[-46,+224]mm · 15 of 60 slices shown, 19 images]
[im 3/60  mediastinal]
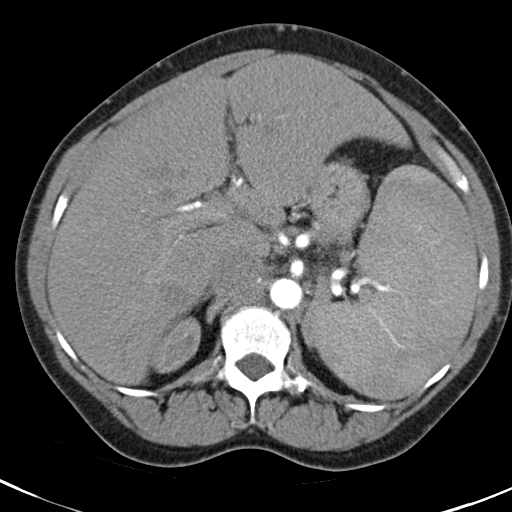
[im 3/60  lung]
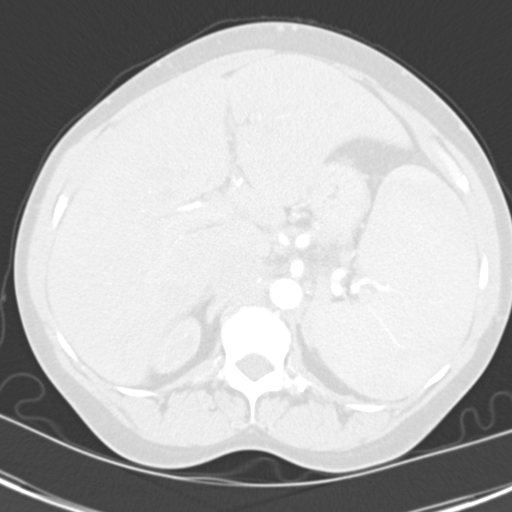
[im 8/60  lung]
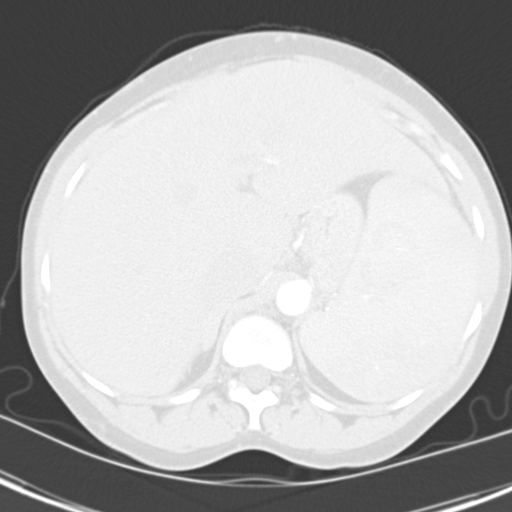
[im 13/60  lung]
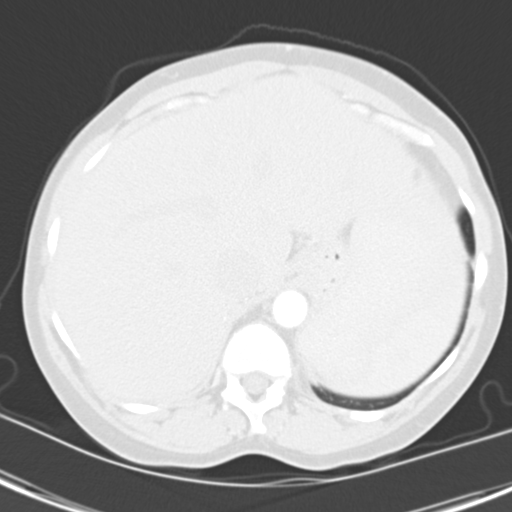
[im 15/60  lung]
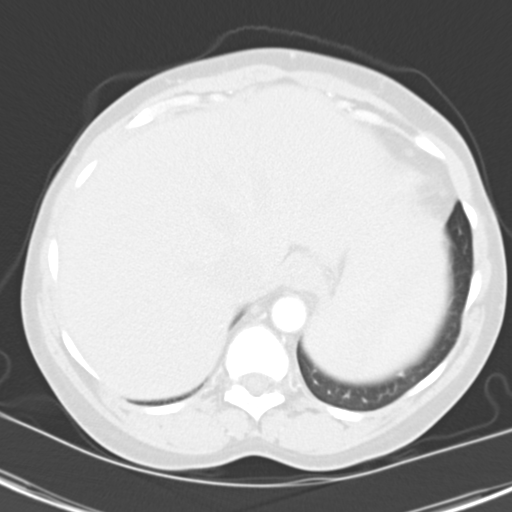
[im 18/60  mediastinal]
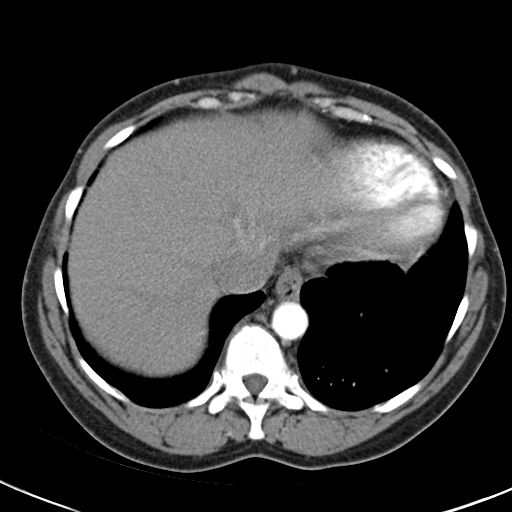
[im 18/60  lung]
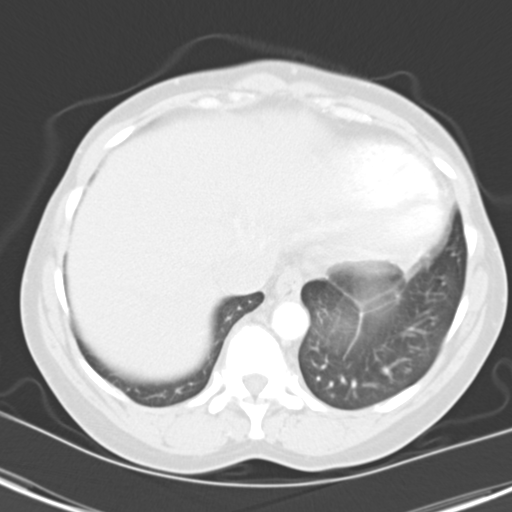
[im 24/60  lung]
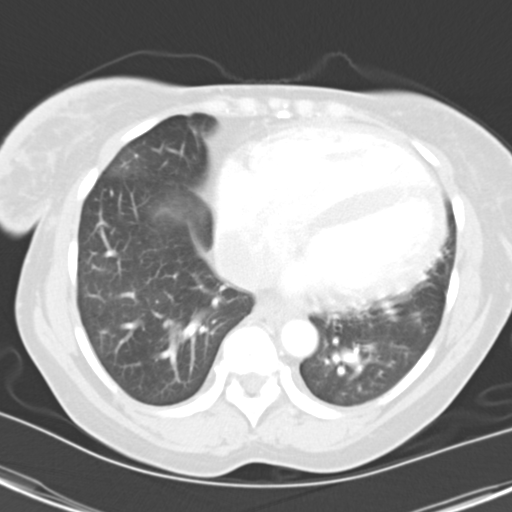
[im 26/60  lung]
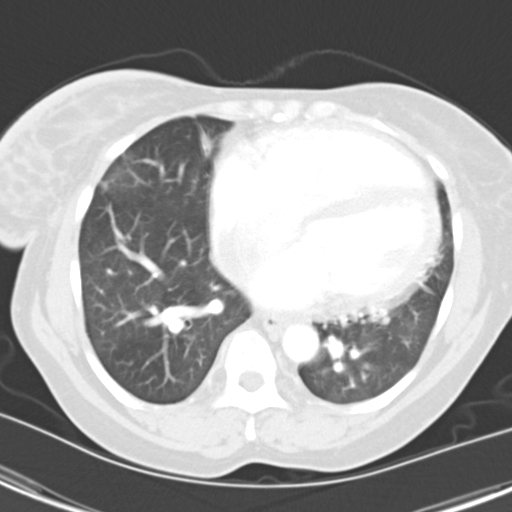
[im 30/60  lung]
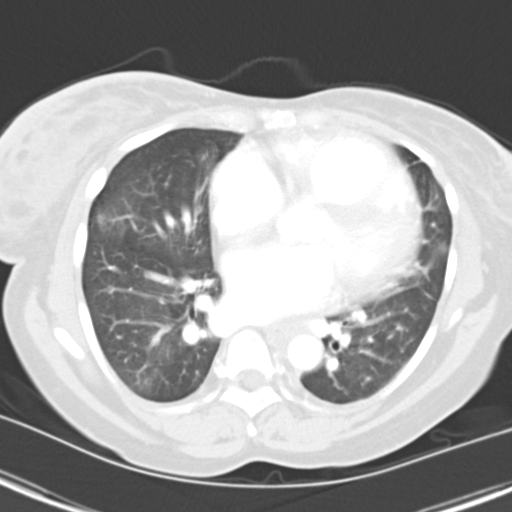
[im 34/60  mediastinal]
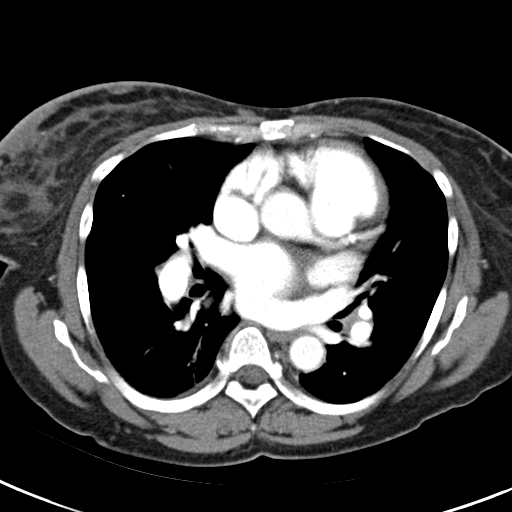
[im 34/60  lung]
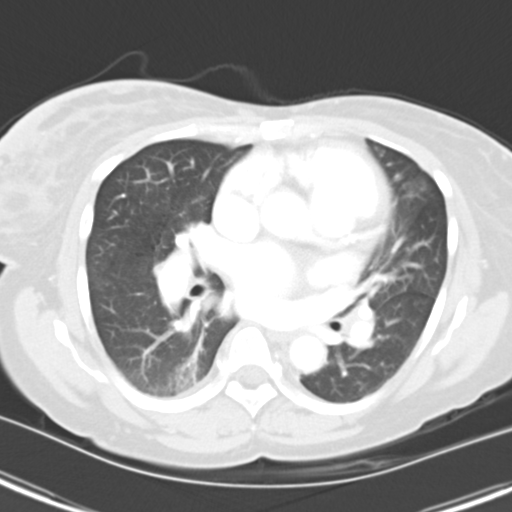
[im 36/60  lung]
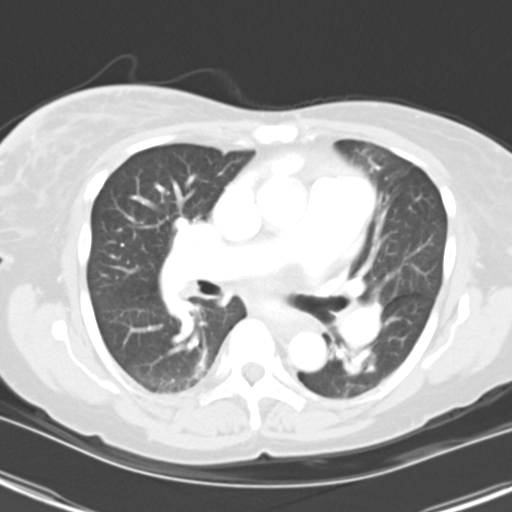
[im 42/60  lung]
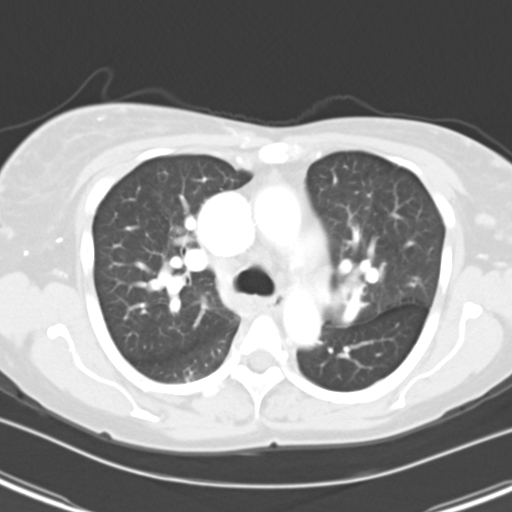
[im 45/60  lung]
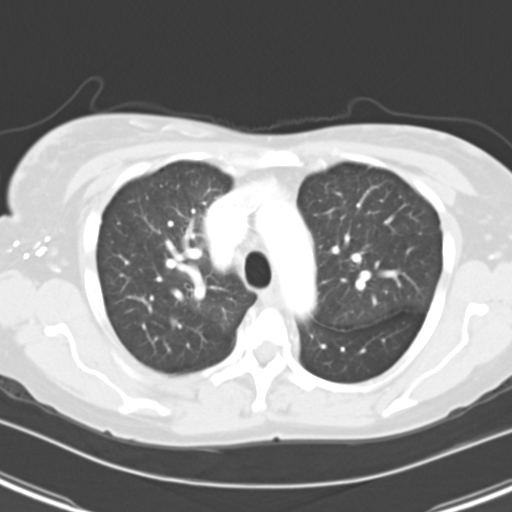
[im 47/60  mediastinal]
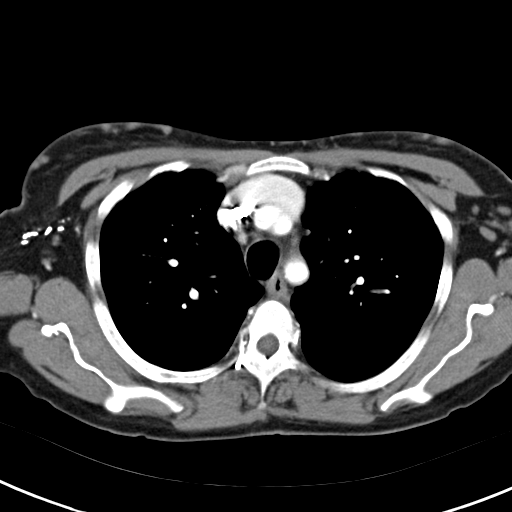
[im 47/60  lung]
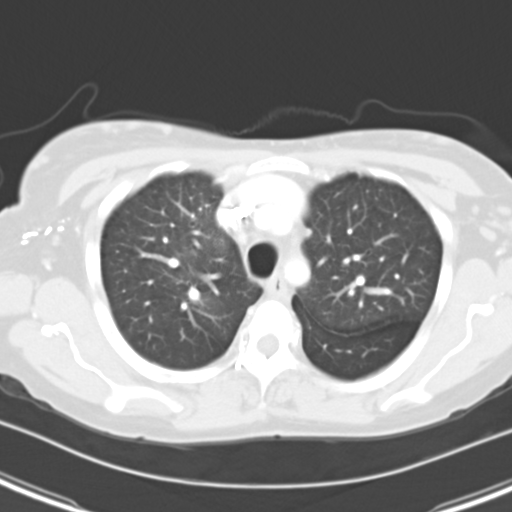
[im 52/60  lung]
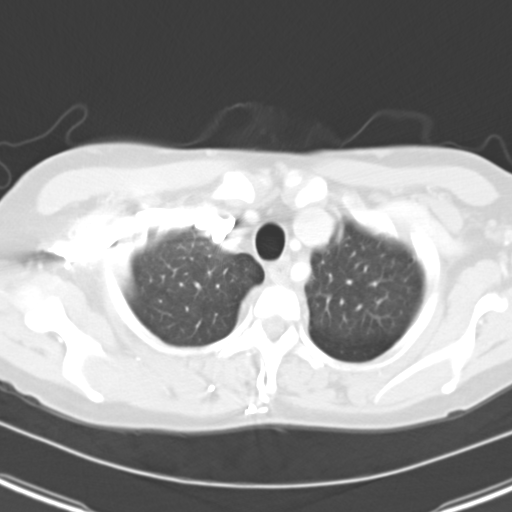
[im 57/60  lung]
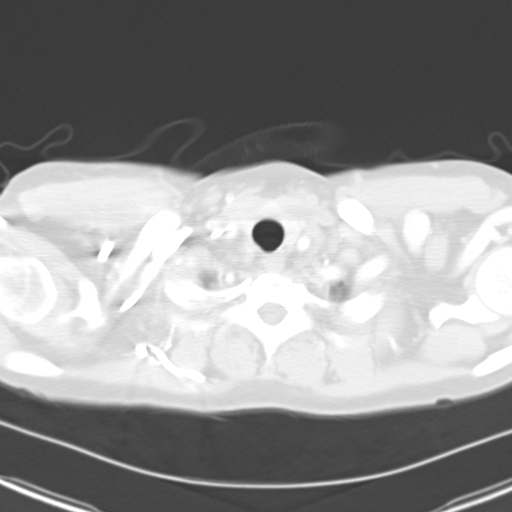

[15 of 31 positions shown; findings below may reference images not displayed]

FINDINGS: Enlargement of the central pulmonary arteries and
pulmonary trunk.  Pulmonary trunk diameter is 3.3 cm compared to
3.0 cm previously.  Findings are compatible with pulmonary arterial
hypertension. Mild bihilar adenopathy, to a lesser degree than
noted previously.  Decrease in size of previously noted enlarged
subcarinal node.  Presently there are no findings to indicate
pathologic subcarinal adenopathy.  Left axillary adenopathy of a
lesser degree than noted previously.  Slightly enlarged left
inferior axillary node measuring 1.2 cm in short axis diameter
(coronal image 33 series 5).  Right axillary surgical clips
consistent with previous nodal dissection.  No pleural or
pericardial effusions.

As noted previously there is a hepatosplenomegaly. Distended
hepatic veins and IVC would be compatible with elevated right heart
pressures.  No evidence of varices.  Kidneys are incompletely
visualized.  Suggestion of small right kidney.  Only the superior
aspect the retroperitoneum is noted.  I am somewhat suspicious that
the patient may have retroperitoneal adenopathy however and
abdominal CT would be necessary for further assessment in that
regard.  Probable periportal adenopathy.
IMPRESSION: Decrease in mediastinal and hilar adenopathy.  Some decrease in
left axillary adenopathy.  No acute pulmonary findings.  Pulmonary
arterial hypertension may be increased in degree.  Elevated right
heart pressures with passive hepatic congestion and
hepatosplenomegaly.

## 2008-02-14 ENCOUNTER — Ambulatory Visit (HOSPITAL_COMMUNITY): Payer: Self-pay | Admitting: Oncology

## 2008-02-14 ENCOUNTER — Encounter: Payer: Self-pay | Admitting: Internal Medicine

## 2008-02-14 ENCOUNTER — Encounter (HOSPITAL_COMMUNITY): Admission: RE | Admit: 2008-02-14 | Discharge: 2008-03-15 | Payer: Self-pay | Admitting: Oncology

## 2008-02-16 ENCOUNTER — Ambulatory Visit: Payer: Self-pay | Admitting: Internal Medicine

## 2008-02-28 ENCOUNTER — Encounter (HOSPITAL_COMMUNITY): Admission: RE | Admit: 2008-02-28 | Discharge: 2008-03-29 | Payer: Self-pay | Admitting: Pulmonary Disease

## 2008-03-08 IMAGING — CR DG BONE SURVEY MET
8 of 10 series · 8 of 10 positions shown · non-contrast
Comparison: [DATE]

CLINICAL DATA: Multiple myeloma.

METASTATIC BONE SURVEY

[view not recorded (1 of 8)]
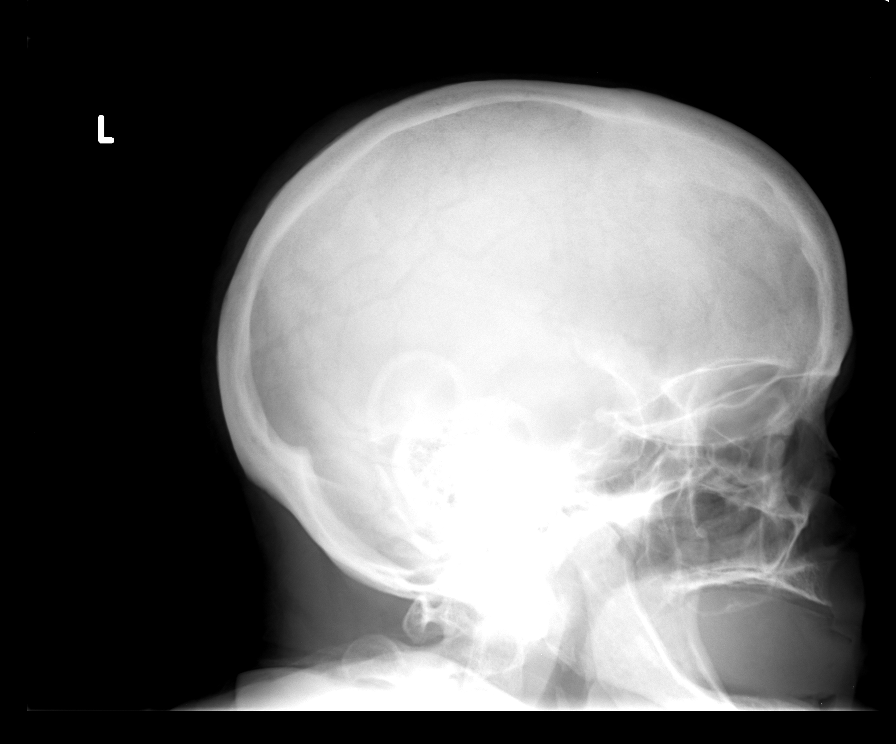

[view not recorded (2 of 8)]
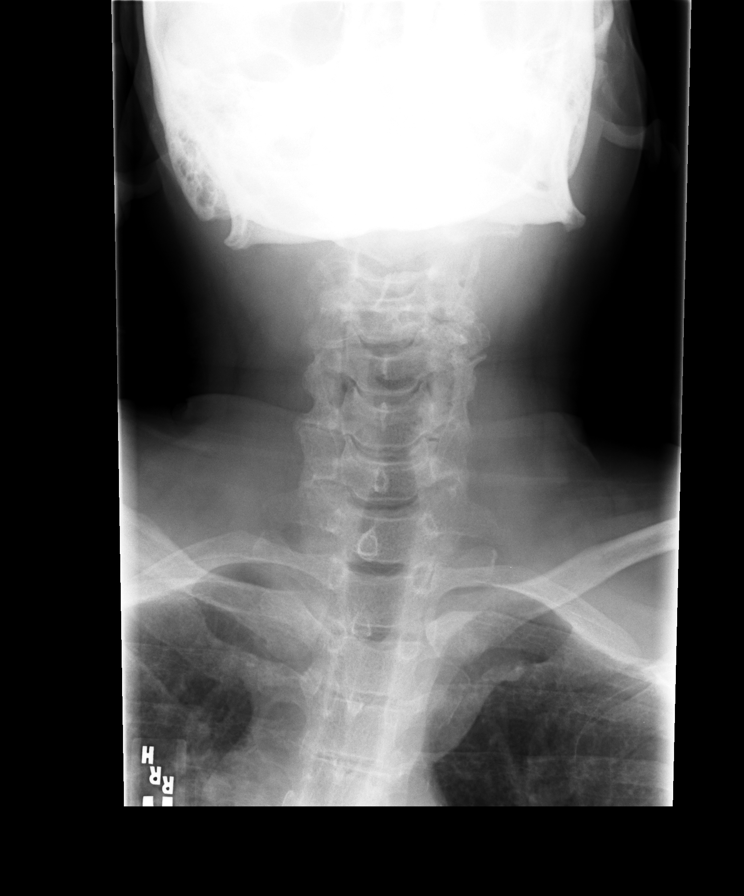

[view not recorded (3 of 8)]
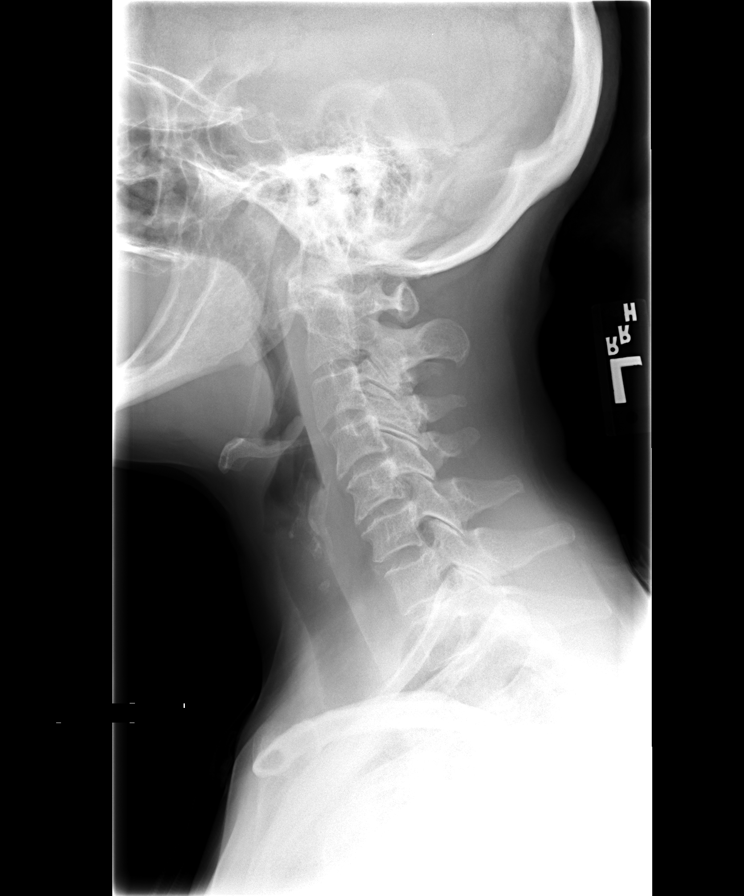

[view not recorded (4 of 8)]
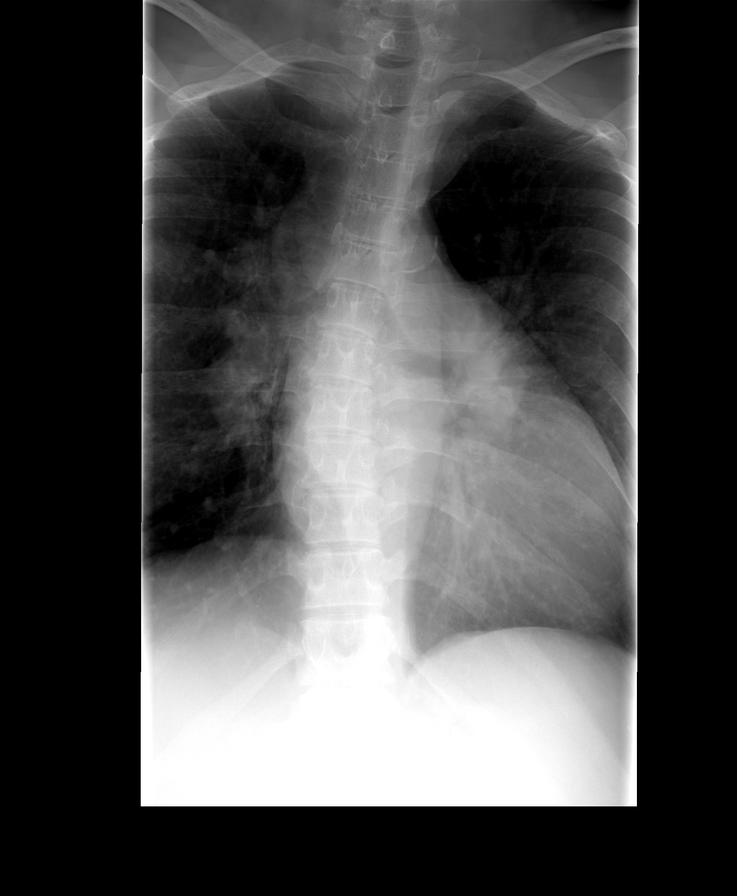

[view not recorded (5 of 8)]
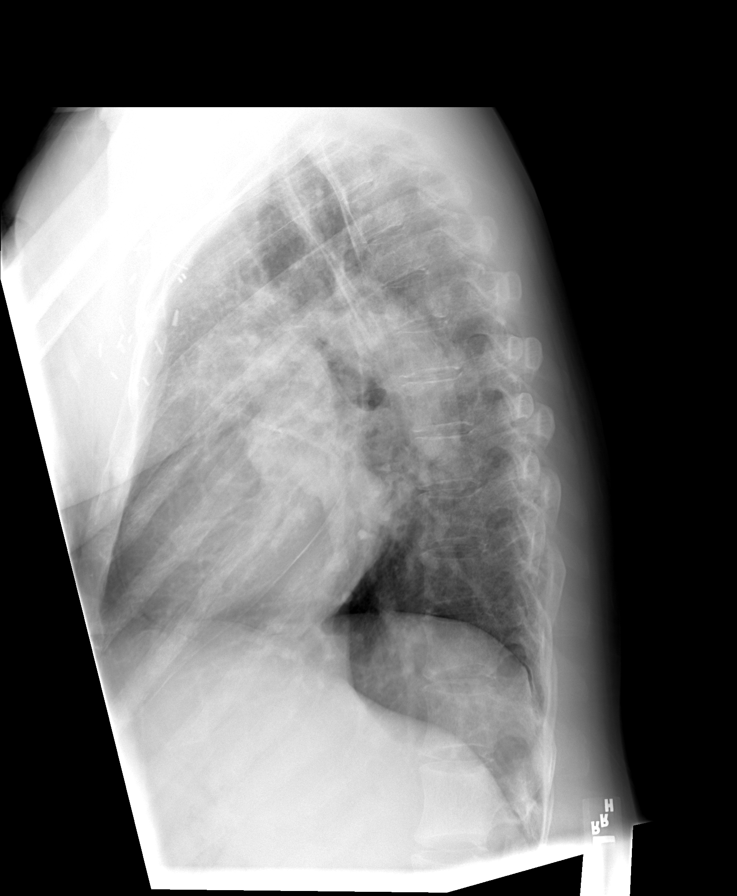

[view not recorded (6 of 8)]
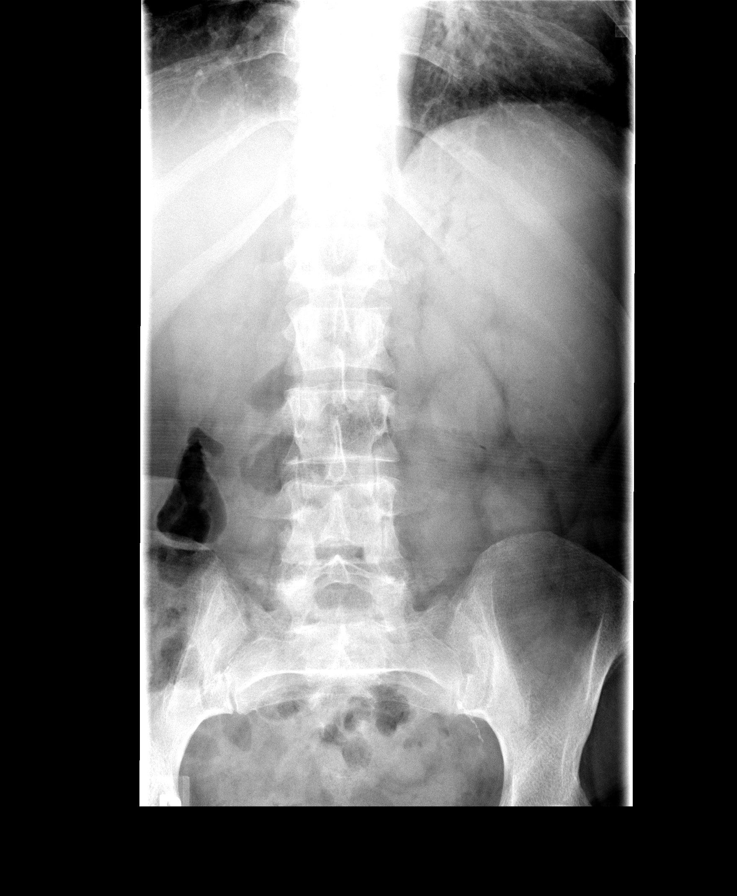

[view not recorded (7 of 8)]
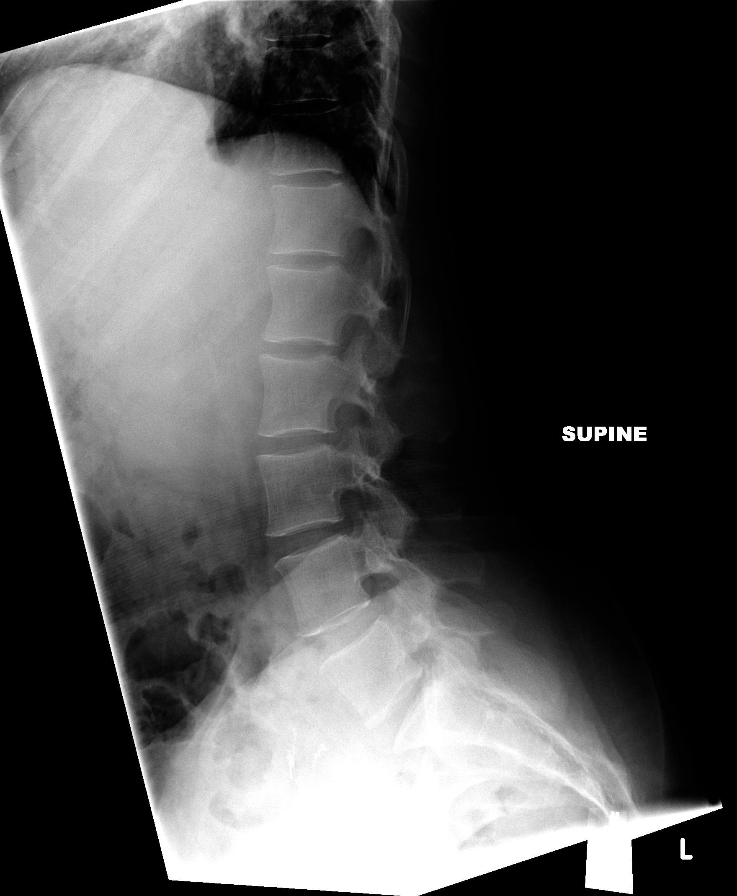

[view not recorded (8 of 8)]
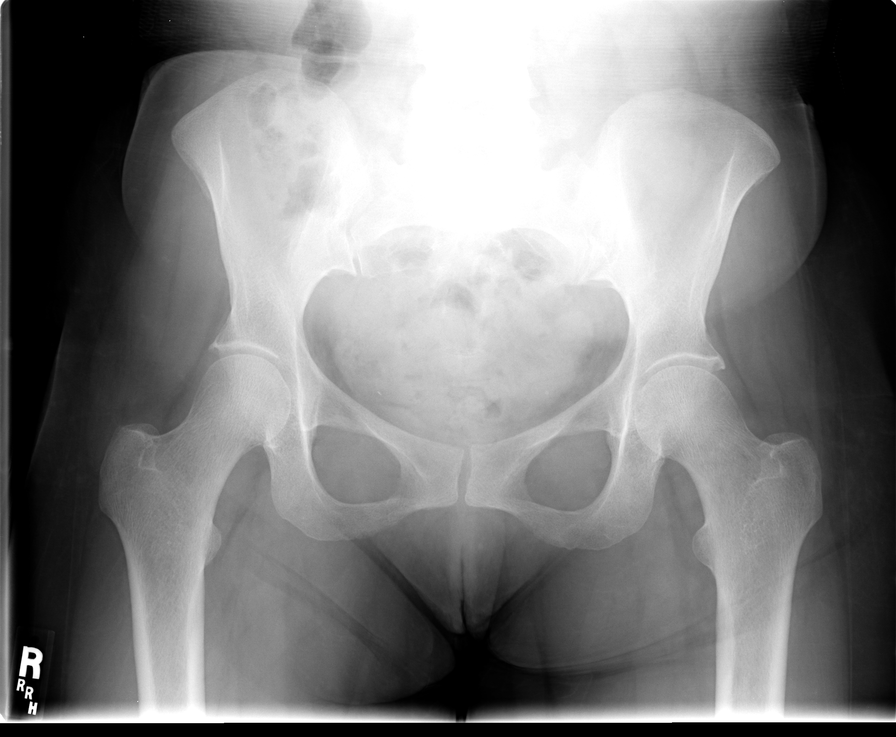

[8 of 10 positions shown; findings below may reference images not displayed]

FINDINGS: No focal lytic or sclerotic bone lesions are seen
involving the axial or visualized proximal appendicular skeleton.
No vertebral compression fractures are identified.  Bilateral
cervical facet DJD is seen, left side greater than right.  Mild
thoracic dextroscoliosis is noted.  Surgical clips are also seen in
the right axillary region.
IMPRESSION: Stable bone survey.  No lytic bone lesions identified.

## 2008-03-20 ENCOUNTER — Encounter (HOSPITAL_COMMUNITY): Payer: Self-pay | Admitting: Oncology

## 2008-03-21 ENCOUNTER — Encounter (HOSPITAL_COMMUNITY): Admission: RE | Admit: 2008-03-21 | Discharge: 2008-04-20 | Payer: Self-pay | Admitting: Oncology

## 2008-03-22 ENCOUNTER — Ambulatory Visit: Payer: Self-pay | Admitting: Internal Medicine

## 2008-03-22 ENCOUNTER — Encounter (INDEPENDENT_AMBULATORY_CARE_PROVIDER_SITE_OTHER): Payer: Self-pay | Admitting: Licensed Clinical Social Worker

## 2008-03-22 LAB — CONVERTED CEMR LAB
HIV 1 RNA Quant: 577 copies/mL — ABNORMAL HIGH (ref <48)
HIV-1 RNA Quant, Log: 2.76 — ABNORMAL HIGH (ref <1.68)

## 2008-03-23 LAB — CONVERTED CEMR LAB
Albumin: 2.7 g/dL — ABNORMAL LOW (ref 3.5–5.2)
CO2: 21 meq/L (ref 19–32)
Calcium: 8.7 mg/dL (ref 8.4–10.5)
Chloride: 100 meq/L (ref 96–112)
Cholesterol: 68 mg/dL (ref 0–200)
Eosinophils Relative: 2 % (ref 0–5)
Glucose, Bld: 60 mg/dL — ABNORMAL LOW (ref 70–99)
HCT: 42 % (ref 36.0–46.0)
Hemoglobin: 13.4 g/dL (ref 12.0–15.0)
Lymphocytes Relative: 44 % (ref 12–46)
Lymphs Abs: 1.8 10*3/uL (ref 0.7–4.0)
Neutro Abs: 1.5 10*3/uL — ABNORMAL LOW (ref 1.7–7.7)
Platelets: 67 10*3/uL — ABNORMAL LOW (ref 150–400)
Sodium: 130 meq/L — ABNORMAL LOW (ref 135–145)
Total Bilirubin: 0.7 mg/dL (ref 0.3–1.2)
Total Protein: 12.8 g/dL — ABNORMAL HIGH (ref 6.0–8.3)
Triglycerides: 69 mg/dL (ref <150)
VLDL: 14 mg/dL (ref 0–40)
WBC: 4.1 10*3/uL (ref 4.0–10.5)

## 2008-03-27 IMAGING — MG MM DIGITAL SCREENING BILAT W/ CAD
4 series · 4 of 4 positions shown · non-contrast
Comparison: none

DG SCREEN MAMMOGRAM BILATERAL
Bilateral CC and MLO view(s) were taken.

DIGITAL SCREENING MAMMOGRAM WITH CAD:
The breast tissue is heterogeneously dense.  No masses or malignant type calcifications are 
identified.  Compared with prior studies.

[L CC]
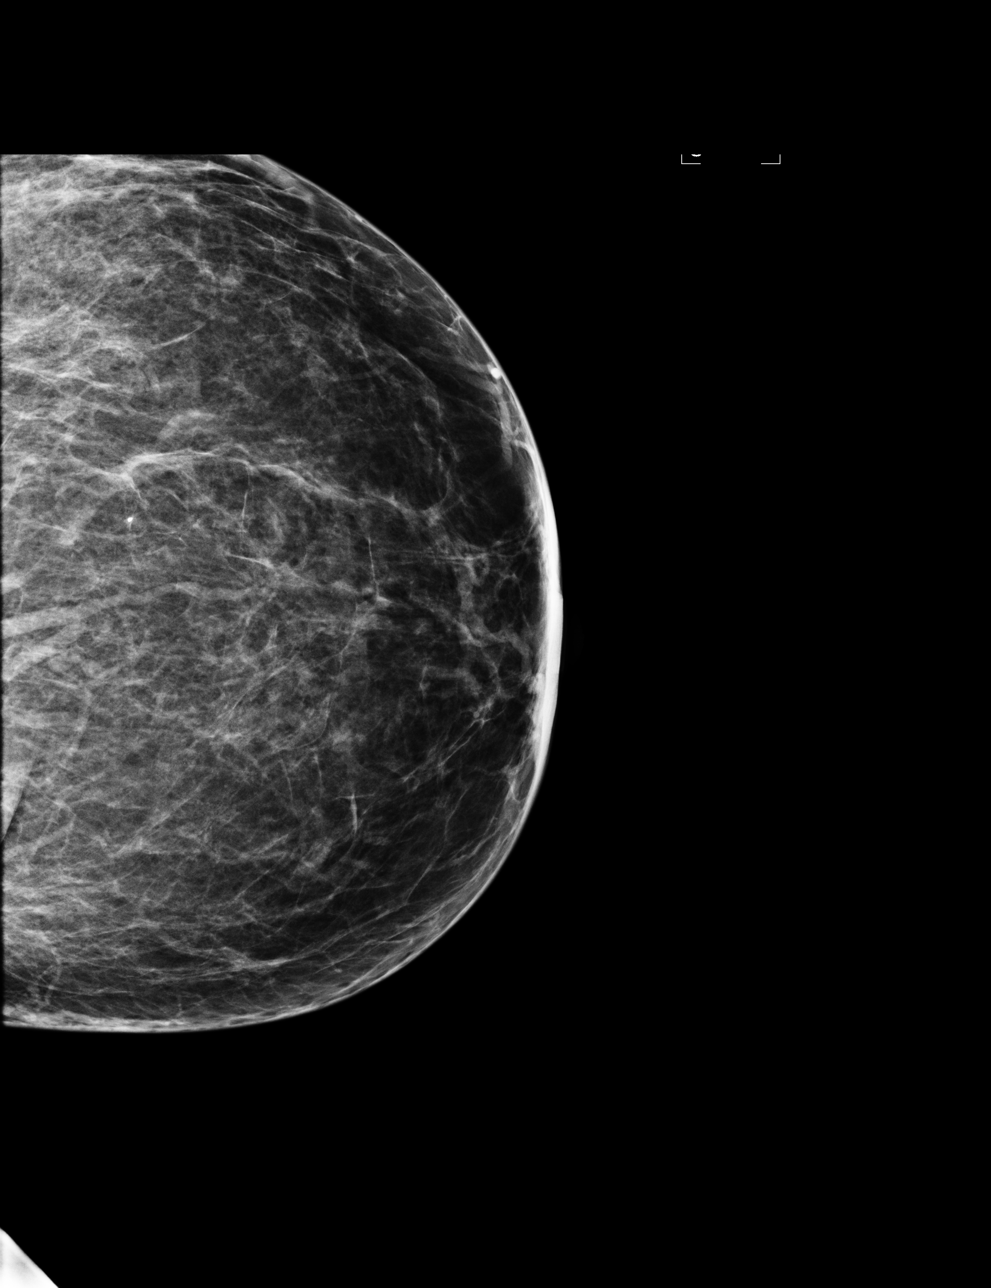

[L MLO]
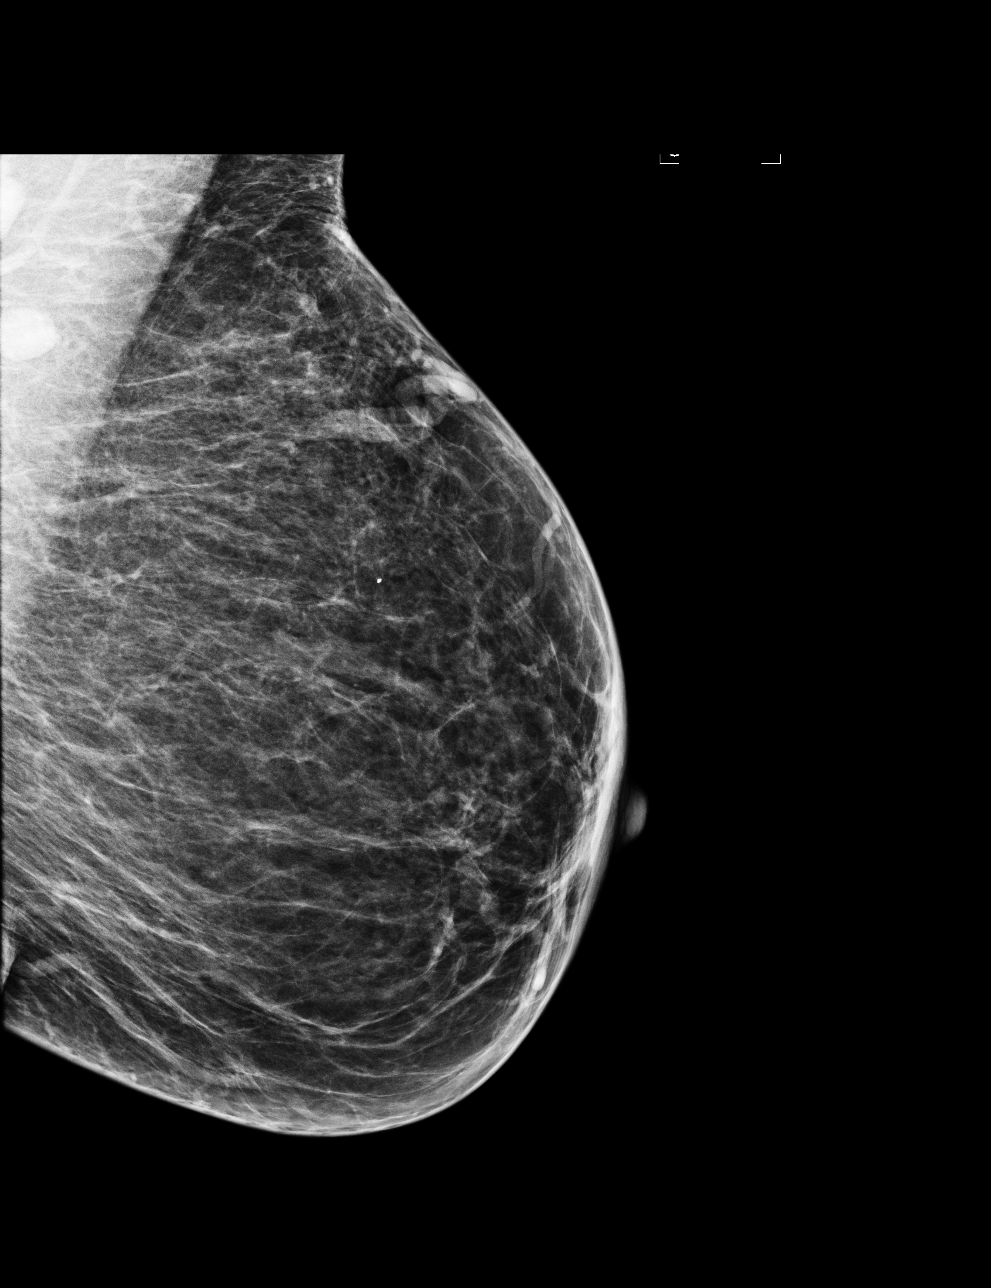

[R CC]
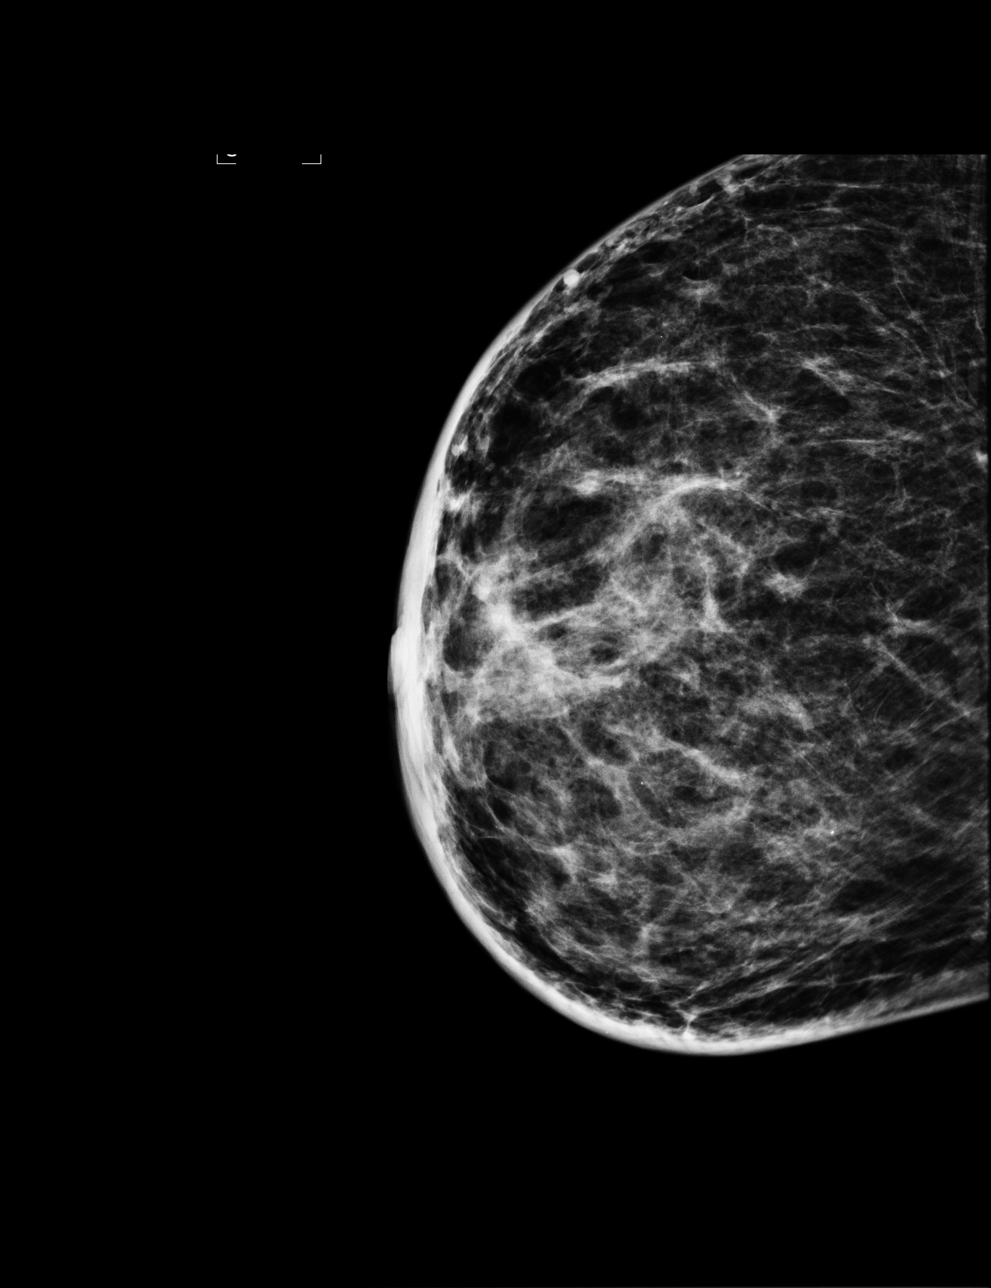

[R MLO]
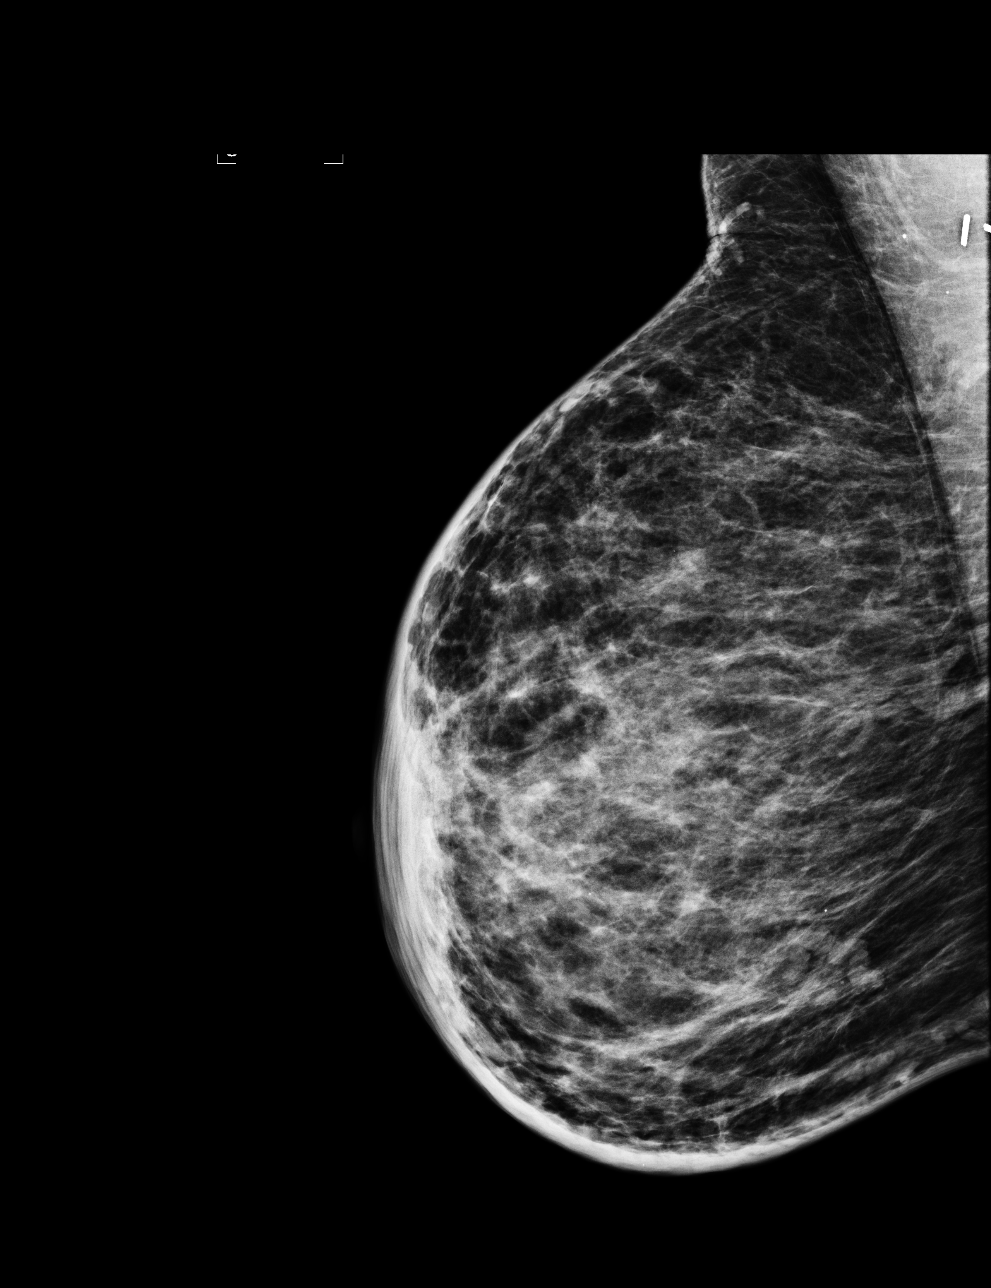

[4 of 4 positions shown; findings below may reference images not displayed]

IMPRESSION: No specific mammographic evidence of malignancy.  Next screening mammogram is recommended in one 
year.

ASSESSMENT: Negative - BI-RADS 1

Screening mammogram in 1 year.
THIS WAS ANALAYZED BY COMPUTER AIDED DETECTION. , THIS PROCEDURE WAS A DIGITAL MAMMOGRAM.

## 2008-04-02 ENCOUNTER — Emergency Department (HOSPITAL_COMMUNITY): Admission: EM | Admit: 2008-04-02 | Discharge: 2008-04-02 | Payer: Self-pay | Admitting: Emergency Medicine

## 2008-05-08 ENCOUNTER — Ambulatory Visit (HOSPITAL_COMMUNITY): Payer: Self-pay | Admitting: Oncology

## 2008-05-08 ENCOUNTER — Encounter: Payer: Self-pay | Admitting: Internal Medicine

## 2008-05-21 ENCOUNTER — Emergency Department (HOSPITAL_COMMUNITY): Admission: EM | Admit: 2008-05-21 | Discharge: 2008-05-21 | Payer: Self-pay | Admitting: Emergency Medicine

## 2008-05-21 IMAGING — CR DG CHEST 2V
2 series · 2 of 2 positions shown · non-contrast
Comparison: [DATE] and multiple previous

CLINICAL DATA: Nausea.  Vomiting.  Cough.

CHEST - 2 VIEW

[view not recorded (1 of 2)]
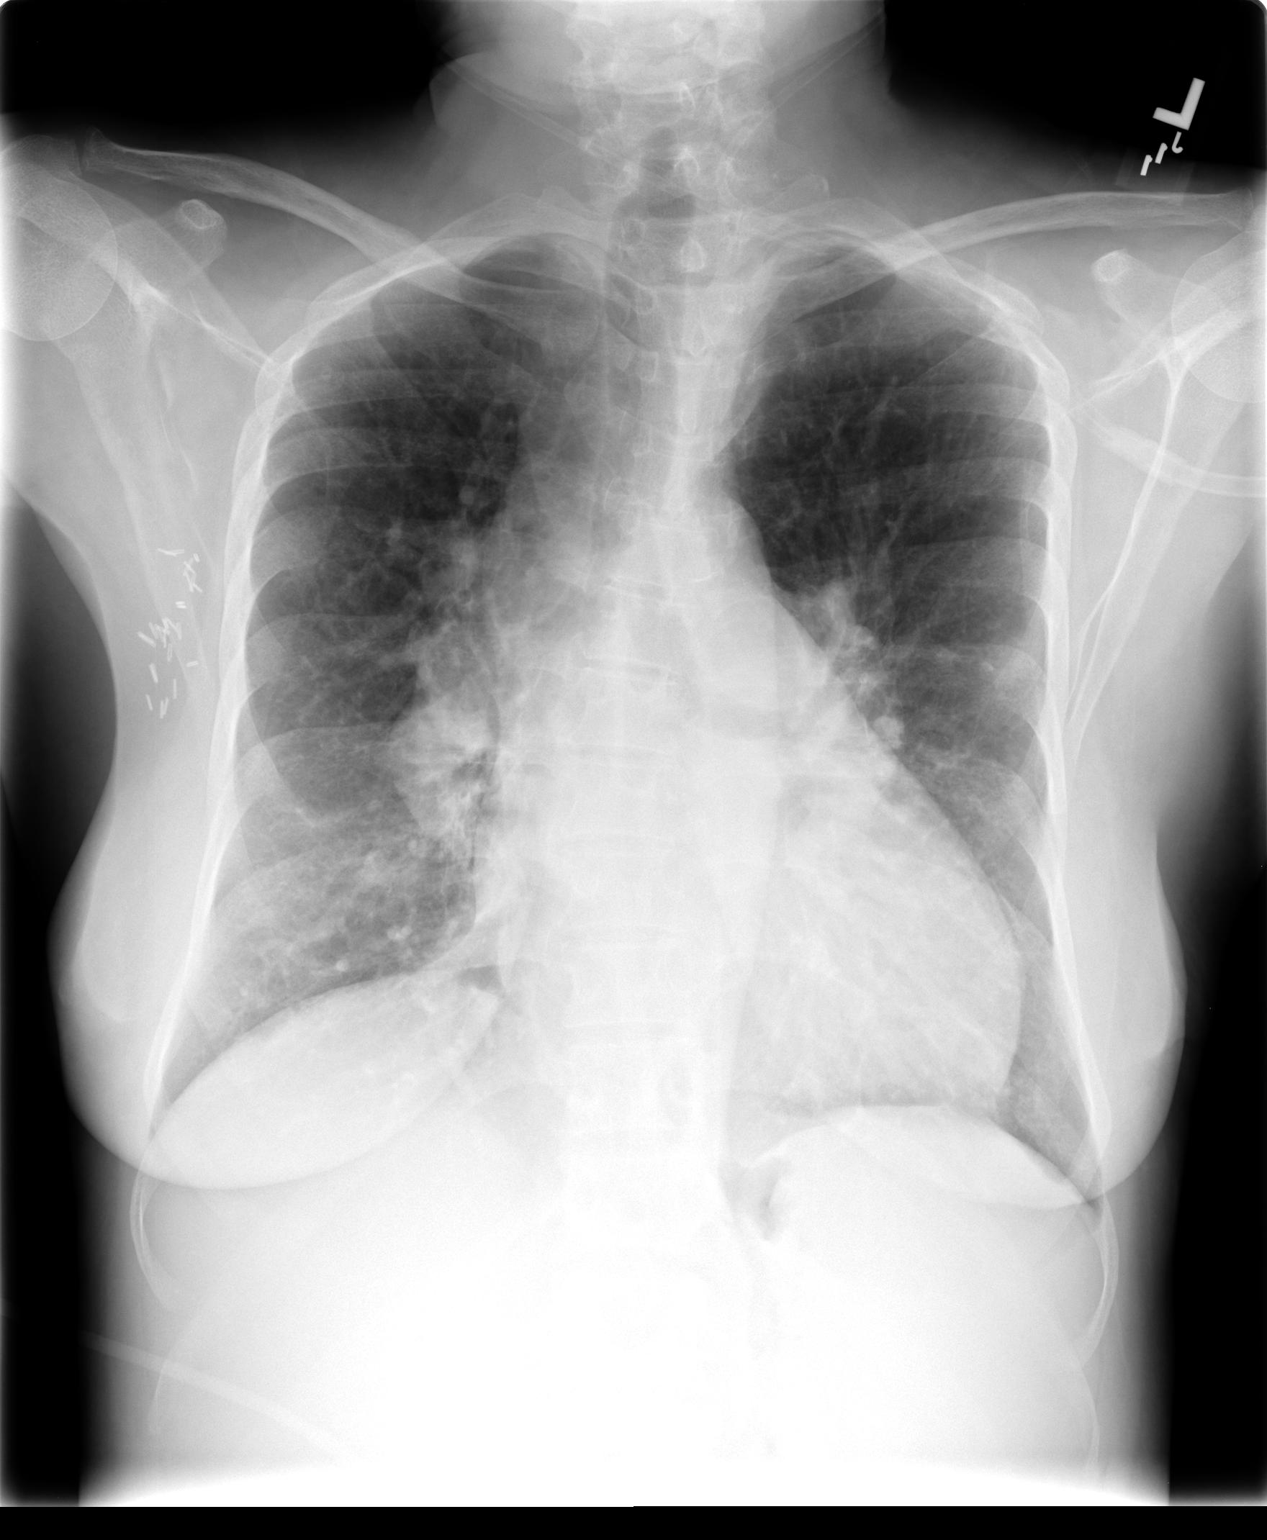

[view not recorded (2 of 2)]
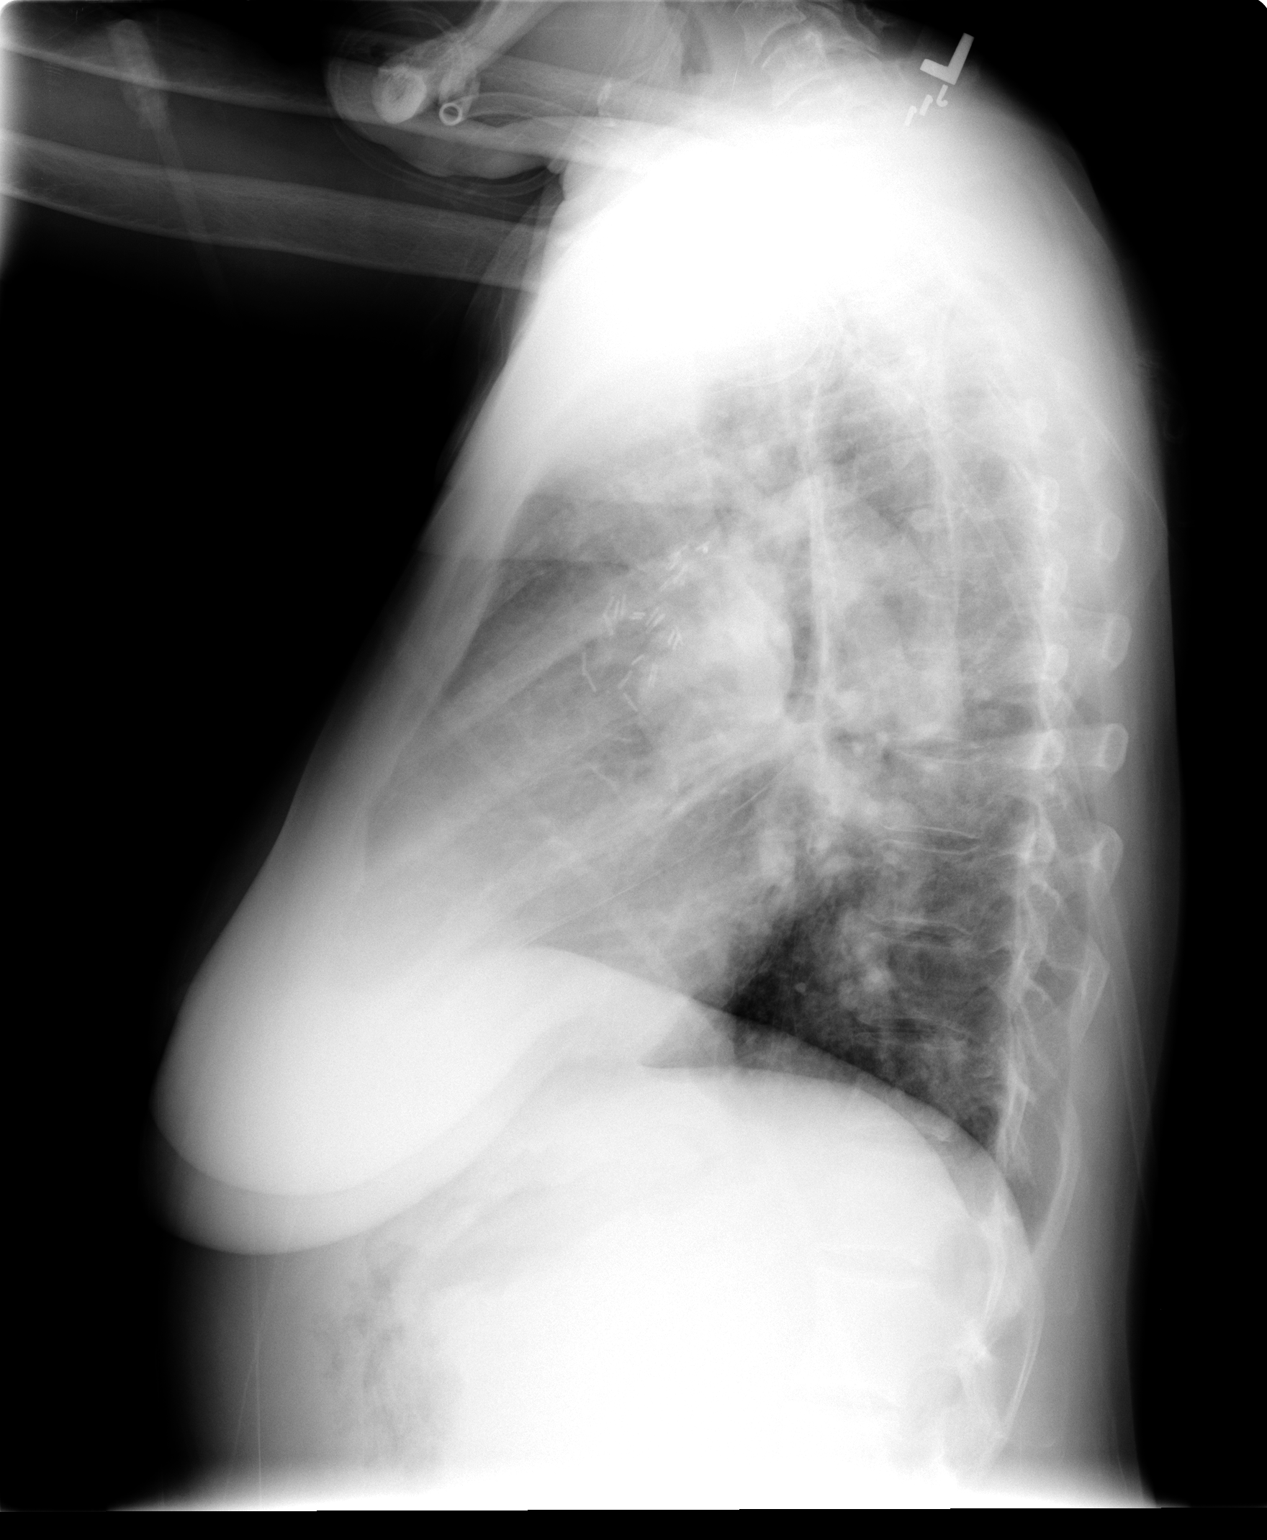

[2 of 2 positions shown; findings below may reference images not displayed]

FINDINGS: Cardiomegaly persists.  There is pulmonary venous
hypertension but there is no frank edema.  No effusions.  No
infiltrate, or collapse.  Surgical clips again noted in the right
exam.
IMPRESSION: Cardiomegaly and pulmonary vascular prominence but no edema,
infiltrate or collapse.

## 2008-05-23 ENCOUNTER — Ambulatory Visit: Payer: Self-pay | Admitting: Internal Medicine

## 2008-05-24 ENCOUNTER — Ambulatory Visit: Payer: Self-pay | Admitting: Internal Medicine

## 2008-05-25 ENCOUNTER — Encounter: Payer: Self-pay | Admitting: Internal Medicine

## 2008-06-04 ENCOUNTER — Telehealth: Payer: Self-pay | Admitting: Licensed Clinical Social Worker

## 2008-06-05 ENCOUNTER — Encounter: Payer: Self-pay | Admitting: Licensed Clinical Social Worker

## 2008-06-08 ENCOUNTER — Telehealth: Payer: Self-pay | Admitting: Licensed Clinical Social Worker

## 2008-06-12 ENCOUNTER — Inpatient Hospital Stay (HOSPITAL_COMMUNITY): Admission: EM | Admit: 2008-06-12 | Discharge: 2008-06-27 | Payer: Self-pay | Admitting: Emergency Medicine

## 2008-06-12 ENCOUNTER — Telehealth: Payer: Self-pay | Admitting: Internal Medicine

## 2008-06-12 ENCOUNTER — Ambulatory Visit: Payer: Self-pay | Admitting: Infectious Diseases

## 2008-06-12 IMAGING — CR DG CHEST 2V
1 series · 1 of 1 positions shown · non-contrast
Comparison: Chest x-ray of [DATE]

CLINICAL DATA: Pain in the left arm cyst dialysis yesterday, former
smoker

CHEST - 2 VIEW

[w chest pa]
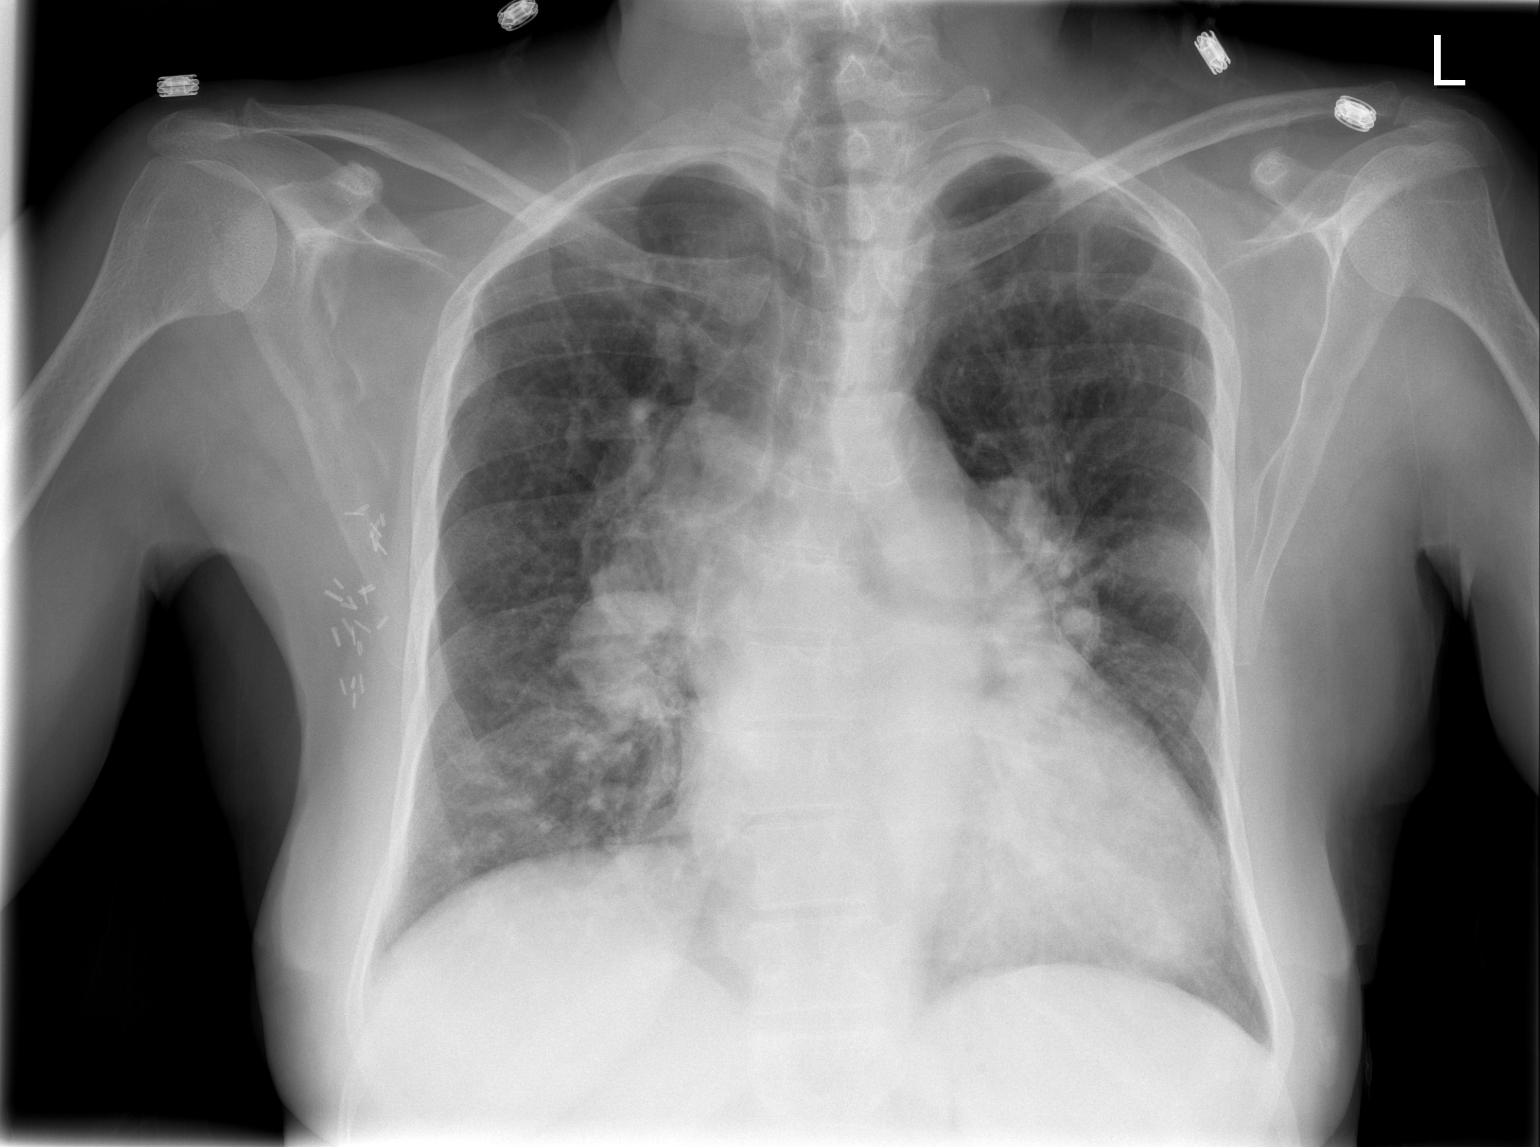

[1 of 1 positions shown; findings below may reference images not displayed]

FINDINGS: Cardiomegaly and mild pulmonary vascular congestion again
noted.  However there is a vague opacity in the periphery of the
left mid lung and pneumonia is a consideration.  The bones are
somewhat osteopenic.  Surgical clips overlie the right axilla.
IMPRESSION: 1.  New patchy opacity in the periphery of the left mid lung may
represent pneumonia.  Recommend follow-up.
2.  Cardiomegaly with mild pulmonary vascular congestion.

## 2008-06-13 ENCOUNTER — Encounter: Payer: Self-pay | Admitting: Internal Medicine

## 2008-06-14 ENCOUNTER — Encounter (INDEPENDENT_AMBULATORY_CARE_PROVIDER_SITE_OTHER): Payer: Self-pay | Admitting: *Deleted

## 2008-06-29 ENCOUNTER — Emergency Department (HOSPITAL_COMMUNITY): Admission: EM | Admit: 2008-06-29 | Discharge: 2008-06-29 | Payer: Self-pay | Admitting: Emergency Medicine

## 2008-06-29 IMAGING — CT CT CHEST W/ CM
2 of 4 series · 15 of 36 positions shown, 18 images · IV contrast (Omnipaque 300)
Comparison: [DATE]

CLINICAL DATA: Left chest pain, diaphoresis, history lymphoma, HIV,
hiatal hernia, end-stage renal disease on dialysis, abnormal chest
x-ray with questionable left upper lobe mass versus infiltrate

CT CHEST WITH CONTRAST
TECHNIQUE: Multidetector CT imaging of the chest was performed
following the standard protocol during bolus administration of
intravenous contrast. Sagittal and coronal MPR images reconstructed
from axial data set.
Contrast: 80 ml [SB]

[Series 2: chestroutine 5.0 b40f · axial · 0.58mm/px · z∈[-309,-44]mm · 12 of 59 slices shown, 15 images]
[im 3/59  mediastinal]
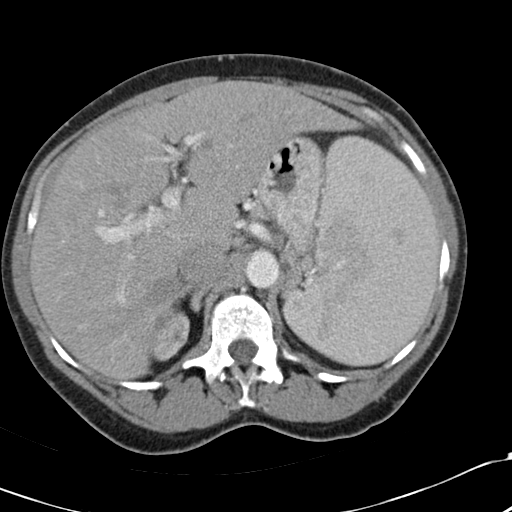
[im 3/59  lung]
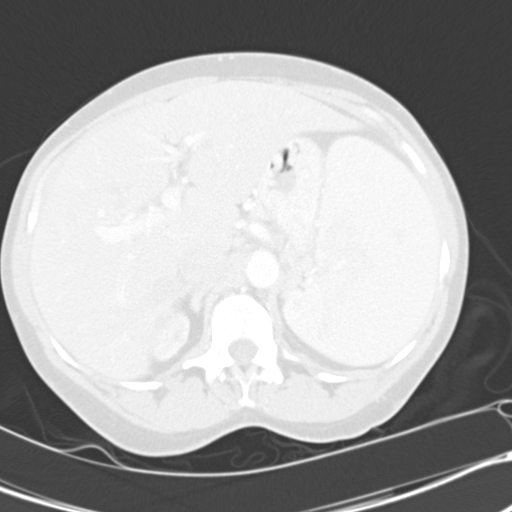
[im 8/59  lung]
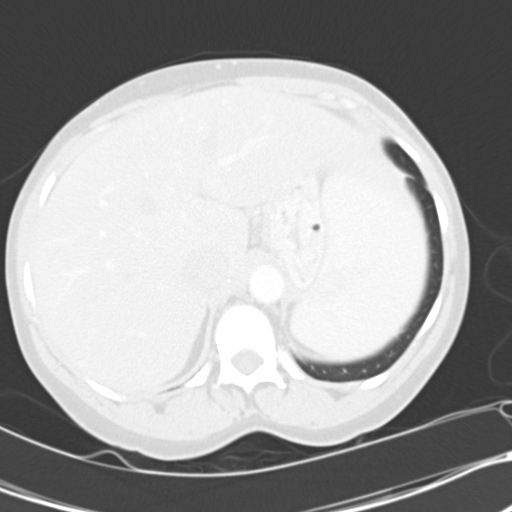
[im 13/59  lung]
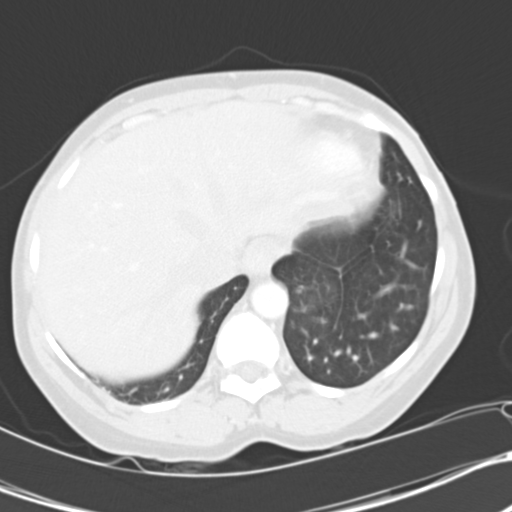
[im 18/59  lung]
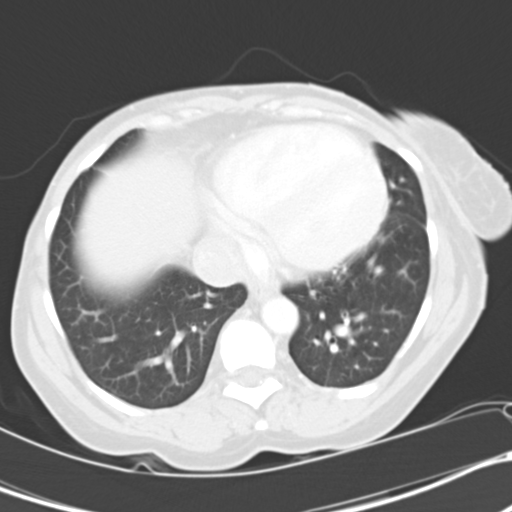
[im 23/59  mediastinal]
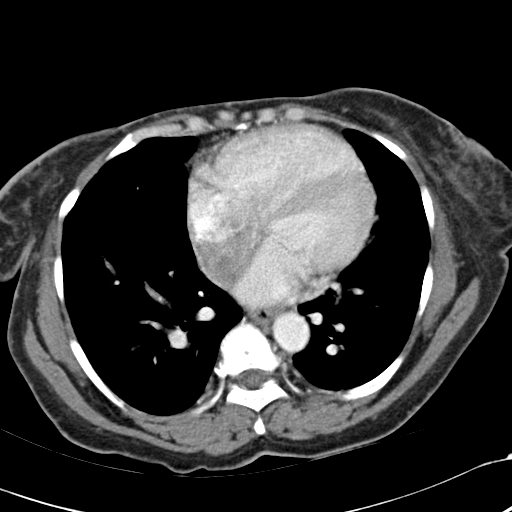
[im 23/59  lung]
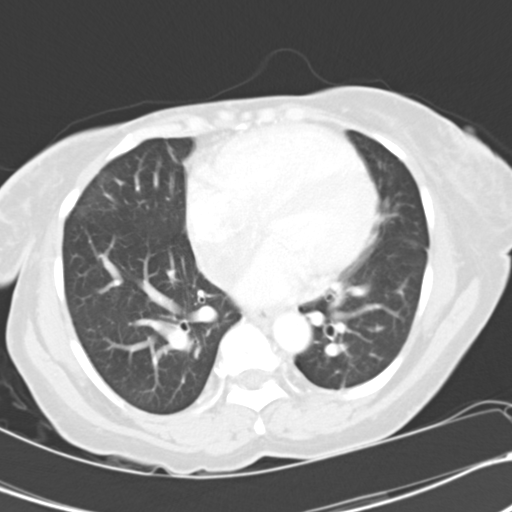
[im 28/59  lung]
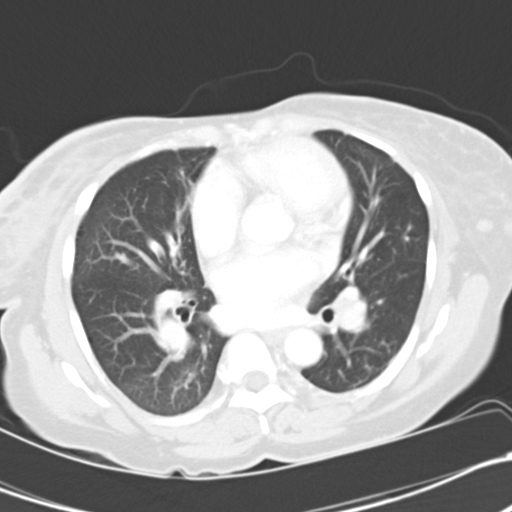
[im 31/59  lung]
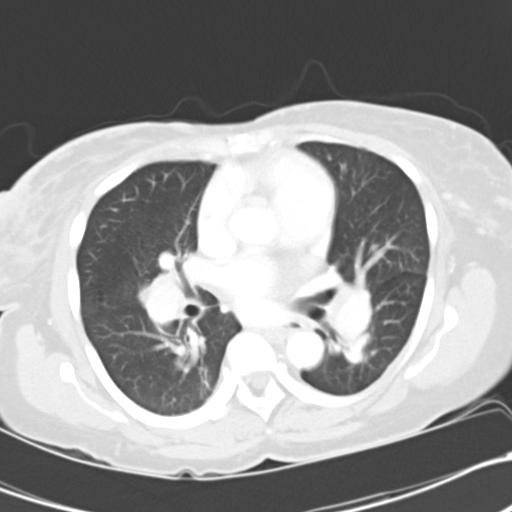
[im 36/59  lung]
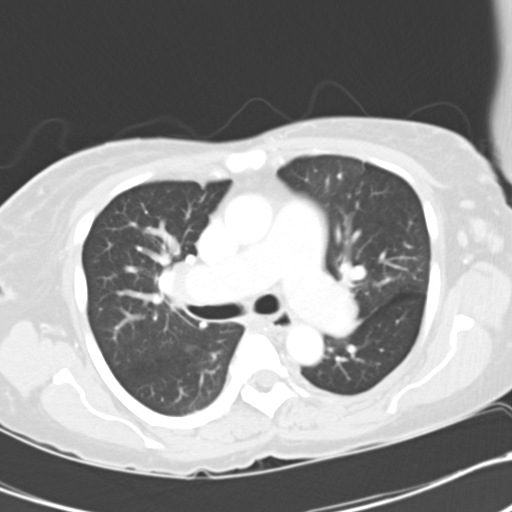
[im 41/59  mediastinal]
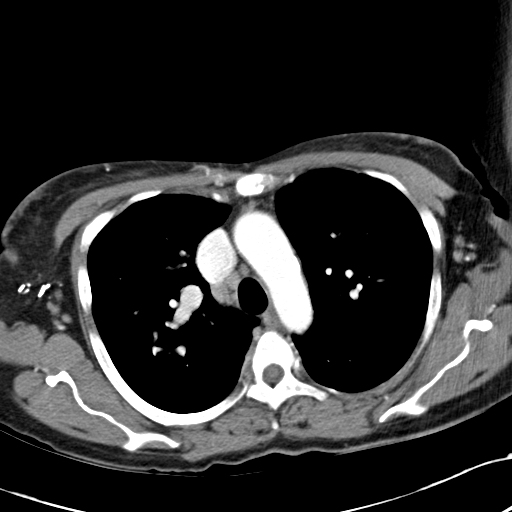
[im 41/59  lung]
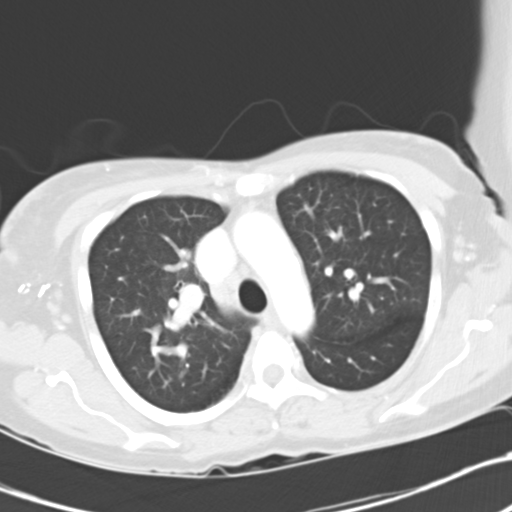
[im 46/59  lung]
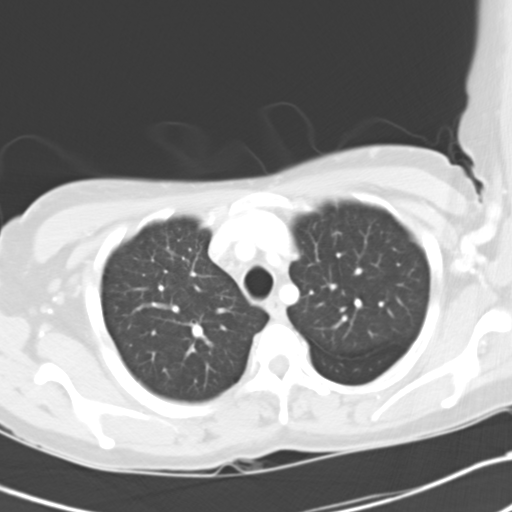
[im 51/59  lung]
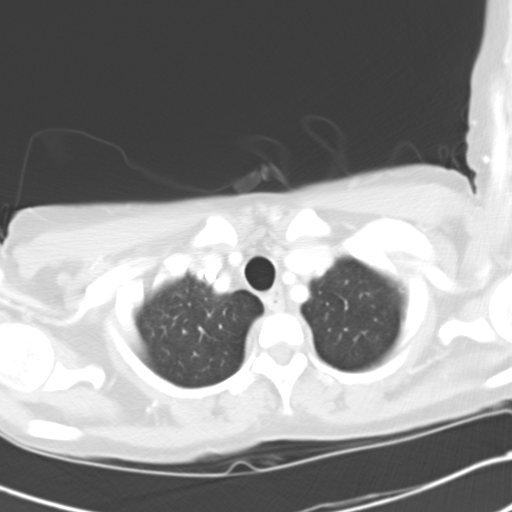
[im 56/59  lung]
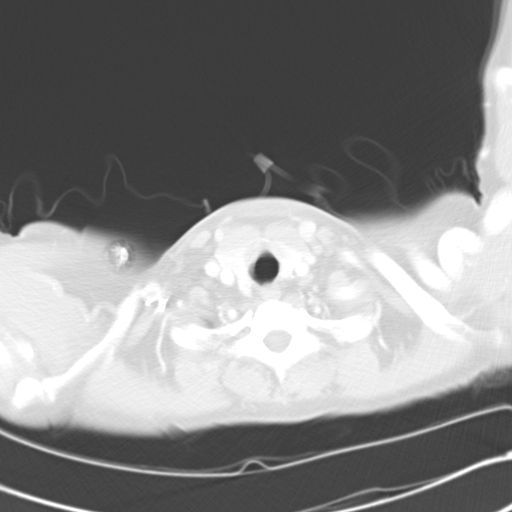

[Series 4: mpr coronal chest 3mm · coronal · 0.58mm/px · 3 of 66 slices shown]
[im 14/66  lung]
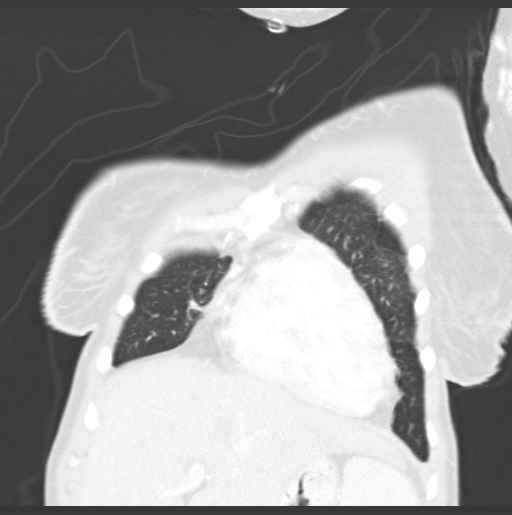
[im 27/66  lung]
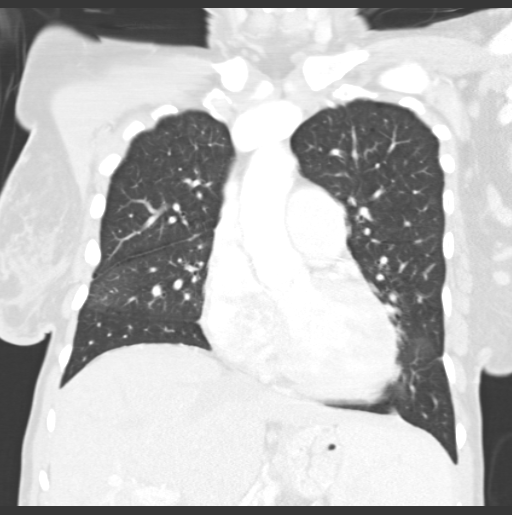
[im 40/66  lung]
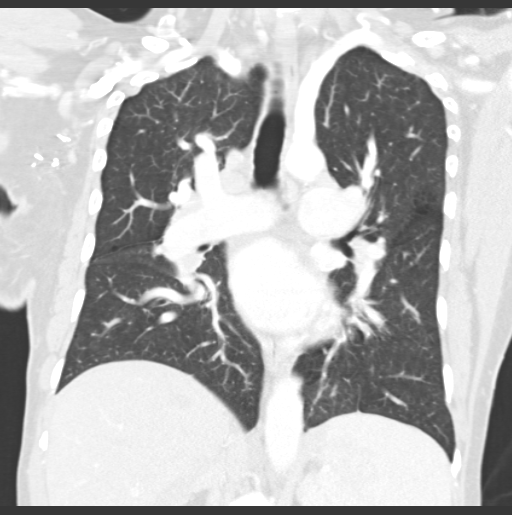

[15 of 36 positions shown; findings below may reference images not displayed]

FINDINGS: Thoracic vascular structures grossly patent on a non dedicated
exam.
Scattered normal upper normal size mediastinal and hilar lymph
nodes.
Numerous though normal sized bilateral axillary lymph nodes,
greater on the left.
Prior right axillary node dissection.
Spleen appears enlarged, 13.7 cm AP, 8.8 cm transverse, extending
beyond extent of CT chest imaging.
Patchy enhancement within spleen could reflect normal filling of
splenic sinusoids is a nonspecific finding.
Tiny nodule in upper pole of right kidney, 8 mm diameter image 59.

Soft tissue mass with mildly spiculated margins in left upper lobe,
2.3 x 2.1 x 1.5 cm, worrisome for tumor.
Minimal scattered peribronchial thickening.
Remaining lungs clear.
No pleural effusion, pneumothorax, or additional pulmonary
mass/nodule.
Bones unremarkable.
IMPRESSION: 2.3 x 2.1 x 1.5 cm diameter spiculated mass in left upper lobe
worrisome for neoplasm, recommend tissue diagnosis.
This nodule is new since prior CT of [DATE].
Splenomegaly.

## 2008-06-29 IMAGING — CR DG CHEST 1V PORT
1 series · 1 of 1 positions shown · non-contrast
Comparison: Portable exam [DM] hours compared to [DATE]

CLINICAL DATA: Chest pain, cough, nausea, pneumonia, hypertension,
end-stage renal disease on dialysis

PORTABLE CHEST - 1 VIEW

[view not recorded]
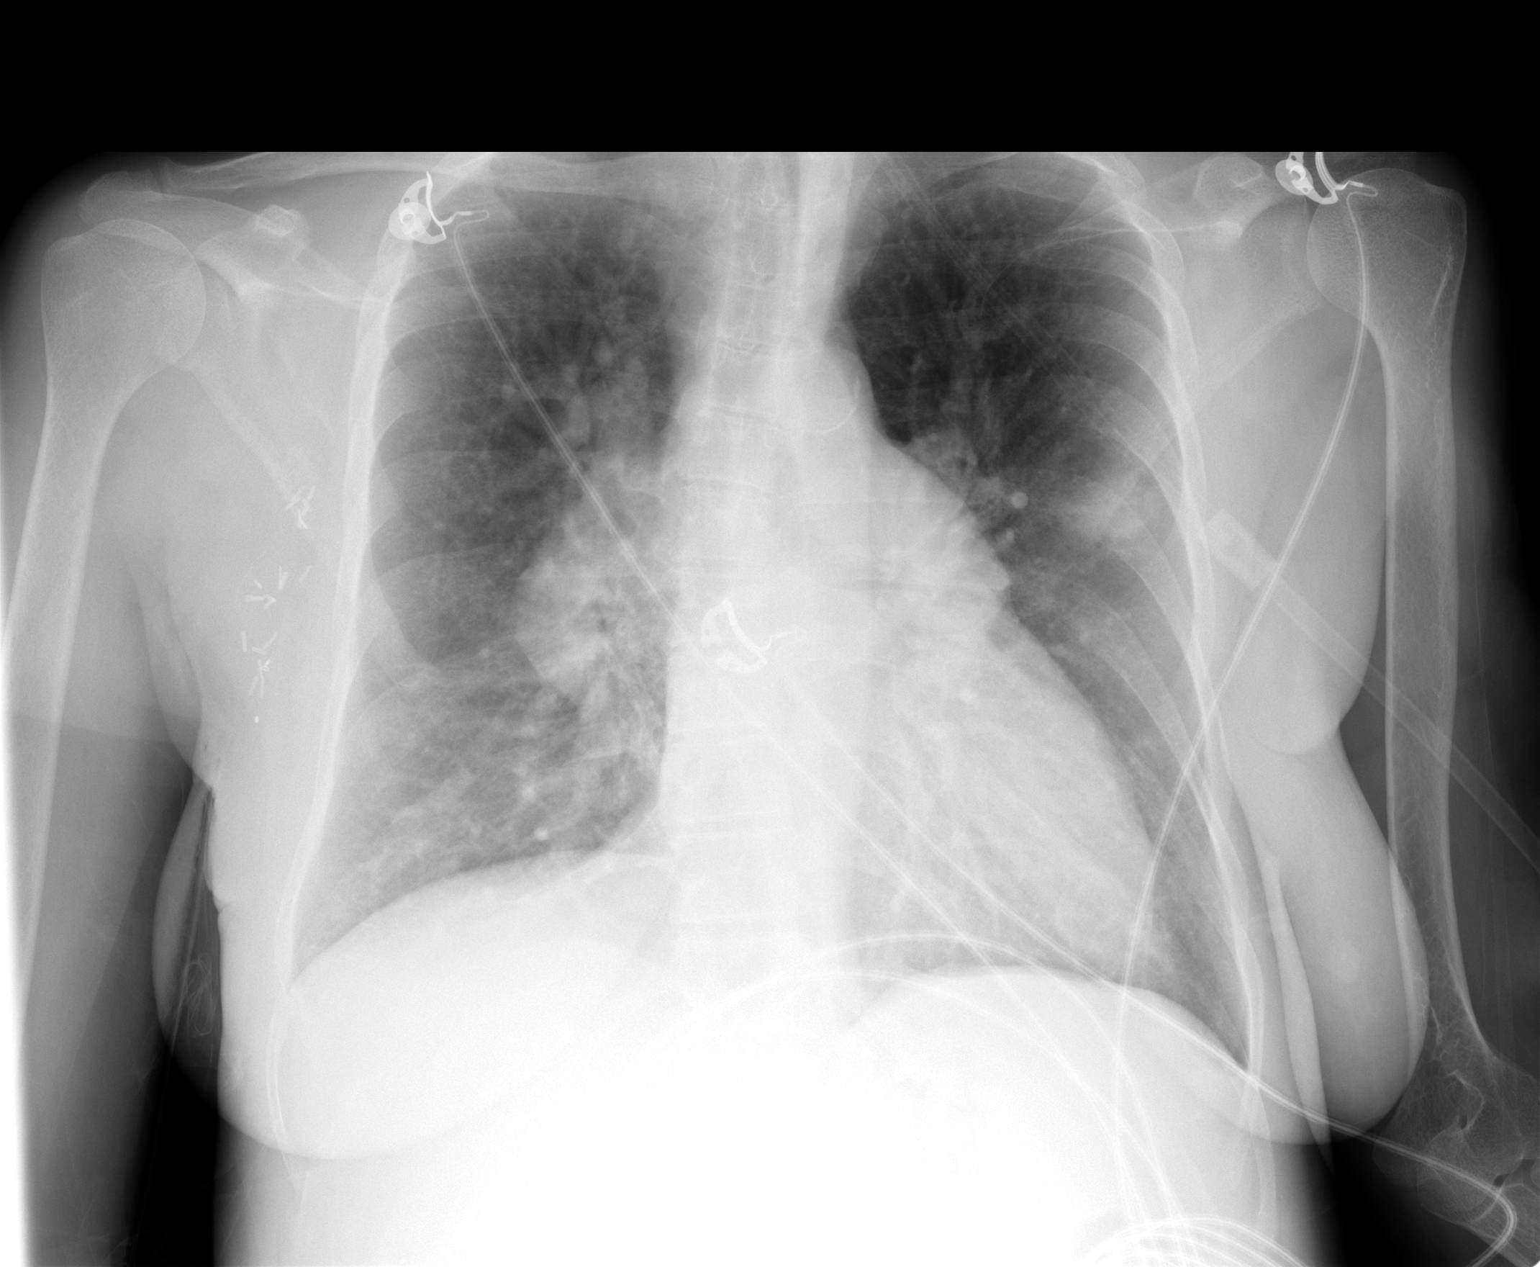

[1 of 1 positions shown; findings below may reference images not displayed]

FINDINGS: Borderline cardiac enlargement.
Significantly enlarged central pulmonary arteries question
pulmonary arterial hypertension.
Mild pulmonary vascular congestion noted.
Atherosclerotic calcifications aortic arch.
Persistent patchy density in left mid lung, approximately 2.5 x
cm, potentially infiltrate though mass lesion not excluded.
This is unchanged from previous study.
Surgical clips in right axilla.
Remaining lungs clear.
Question slight bony demineralization.
IMPRESSION: Enlarged central pulmonary arteries question pulmonary arterial
hypertension.
Minimal pulmonary vascular congestion without overt failure.
Patchy density right mid lung 2.5 cm diameter unchanged from
previous exam, question persistent infiltrate although pulmonary
mass/nodule is not excluded.
This requires follow-up radiographic assessment until resolution or
computed tomography to exclude tumor.

## 2008-07-24 ENCOUNTER — Ambulatory Visit: Payer: Self-pay | Admitting: Cardiology

## 2008-08-07 ENCOUNTER — Ambulatory Visit: Payer: Self-pay | Admitting: Internal Medicine

## 2008-08-07 DIAGNOSIS — M25529 Pain in unspecified elbow: Secondary | ICD-10-CM | POA: Insufficient documentation

## 2008-08-07 LAB — CONVERTED CEMR LAB: HIV 1 RNA Quant: <48 copies/mL (ref <48)

## 2008-08-08 LAB — CONVERTED CEMR LAB
ALT: 16 units/L (ref 0–35)
AST: 22 units/L (ref 0–37)
Albumin: 2.6 g/dL — ABNORMAL LOW (ref 3.5–5.2)
Basophils Absolute: 0 10*3/uL (ref 0.0–0.1)
Basophils Relative: 0 % (ref 0–1)
CO2: 25 meq/L (ref 19–32)
Calcium: 7.6 mg/dL — ABNORMAL LOW (ref 8.4–10.5)
Chloride: 97 meq/L (ref 96–112)
Eosinophils Absolute: 0.1 10*3/uL (ref 0.0–0.7)
GFR calc Af Amer: 7 mL/min — ABNORMAL LOW (ref >60)
MCHC: 31.5 g/dL (ref 30.0–36.0)
MCV: 91.4 fL (ref 78.0–100.0)
Monocytes Relative: 16 % — ABNORMAL HIGH (ref 3–12)
Neutro Abs: 1.5 10*3/uL — ABNORMAL LOW (ref 1.7–7.7)
Neutrophils Relative %: 45 % (ref 43–77)
Potassium: 4.2 meq/L (ref 3.5–5.3)
RDW: 22.6 % — ABNORMAL HIGH (ref 11.5–15.5)
Sodium: 130 meq/L — ABNORMAL LOW (ref 135–145)
Total Protein: 11.8 g/dL — ABNORMAL HIGH (ref 6.0–8.3)

## 2008-11-07 ENCOUNTER — Ambulatory Visit: Payer: Self-pay | Admitting: Internal Medicine

## 2008-11-07 LAB — CONVERTED CEMR LAB
HIV 1 RNA Quant: <48 copies/mL (ref <48)
HIV-1 RNA Quant, Log: <1.68 (ref <1.68)

## 2008-11-08 LAB — CONVERTED CEMR LAB
Alkaline Phosphatase: 87 units/L (ref 39–117)
BUN: 42 mg/dL — ABNORMAL HIGH (ref 6–23)
Basophils Absolute: 0 10*3/uL (ref 0.0–0.1)
Basophils Relative: 0 % (ref 0–1)
CO2: 22 meq/L (ref 19–32)
Creatinine, Ser: 8.13 mg/dL — ABNORMAL HIGH (ref 0.40–1.20)
Eosinophils Absolute: 0 10*3/uL (ref 0.0–0.7)
Glucose, Bld: 76 mg/dL (ref 70–99)
Hemoglobin: 10 g/dL — ABNORMAL LOW (ref 12.0–15.0)
MCHC: 31.2 g/dL (ref 30.0–36.0)
MCV: 98.5 fL (ref >=78.0)
Monocytes Absolute: 0.6 10*3/uL (ref 0.1–1.0)
Monocytes Relative: 17 % — ABNORMAL HIGH (ref 3–12)
Neutro Abs: 1.6 10*3/uL — ABNORMAL LOW (ref 1.7–7.7)
Neutrophils Relative %: 47 % (ref 43–77)
RBC: 3.26 M/uL — ABNORMAL LOW (ref 3.87–5.11)
RDW: 15.1 % (ref 11.5–15.5)
Total Bilirubin: 0.5 mg/dL (ref 0.3–1.2)

## 2008-11-15 DIAGNOSIS — Z87891 Personal history of nicotine dependence: Secondary | ICD-10-CM | POA: Insufficient documentation

## 2008-11-15 DIAGNOSIS — F1010 Alcohol abuse, uncomplicated: Secondary | ICD-10-CM | POA: Insufficient documentation

## 2008-11-15 DIAGNOSIS — N186 End stage renal disease: Secondary | ICD-10-CM | POA: Insufficient documentation

## 2008-11-15 DIAGNOSIS — Z8719 Personal history of other diseases of the digestive system: Secondary | ICD-10-CM | POA: Insufficient documentation

## 2008-11-15 DIAGNOSIS — B20 Human immunodeficiency virus [HIV] disease: Secondary | ICD-10-CM | POA: Insufficient documentation

## 2008-11-15 DIAGNOSIS — I509 Heart failure, unspecified: Secondary | ICD-10-CM | POA: Insufficient documentation

## 2008-11-16 ENCOUNTER — Ambulatory Visit: Payer: Self-pay | Admitting: Internal Medicine

## 2008-11-19 ENCOUNTER — Encounter: Payer: Self-pay | Admitting: Internal Medicine

## 2008-11-28 ENCOUNTER — Emergency Department (HOSPITAL_COMMUNITY): Admission: EM | Admit: 2008-11-28 | Discharge: 2008-11-28 | Payer: Self-pay | Admitting: Emergency Medicine

## 2009-01-19 ENCOUNTER — Inpatient Hospital Stay (HOSPITAL_COMMUNITY): Admission: EM | Admit: 2009-01-19 | Discharge: 2009-01-30 | Payer: Self-pay | Admitting: Emergency Medicine

## 2009-01-19 ENCOUNTER — Ambulatory Visit: Payer: Self-pay | Admitting: Cardiology

## 2009-01-19 IMAGING — CR DG ABDOMEN ACUTE W/ 1V CHEST
3 series · 3 of 3 positions shown · non-contrast
Comparison: CT [DATE], chest radiograph [DATE]

CLINICAL DATA: Nausea, vomiting

ACUTE ABDOMEN SERIES (ABDOMEN 2 VIEW & CHEST 1 VIEW)

[view not recorded (1 of 3)]
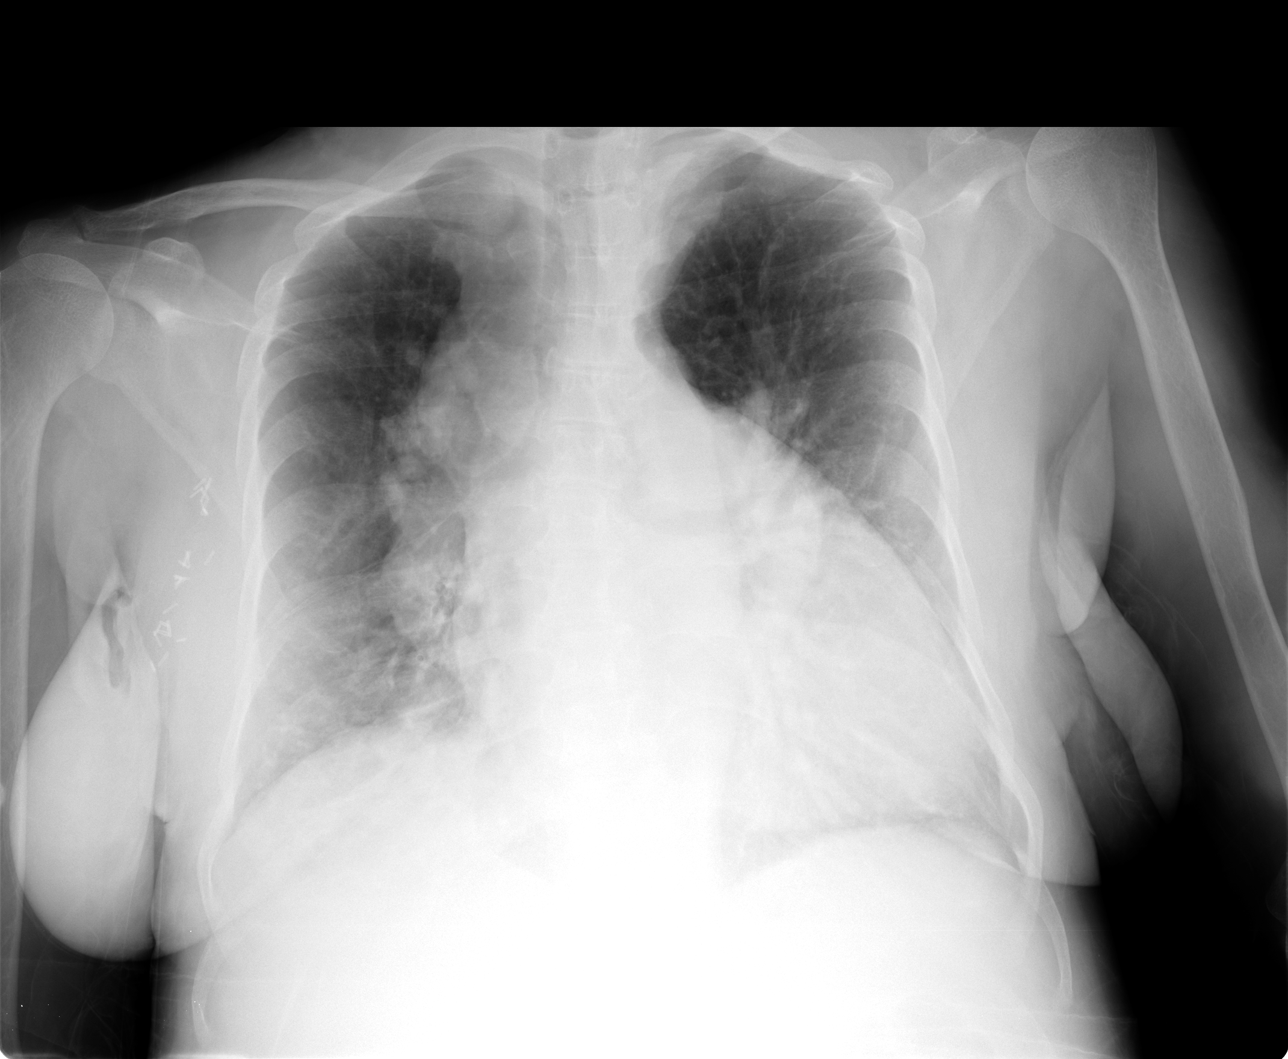

[view not recorded (2 of 3)]
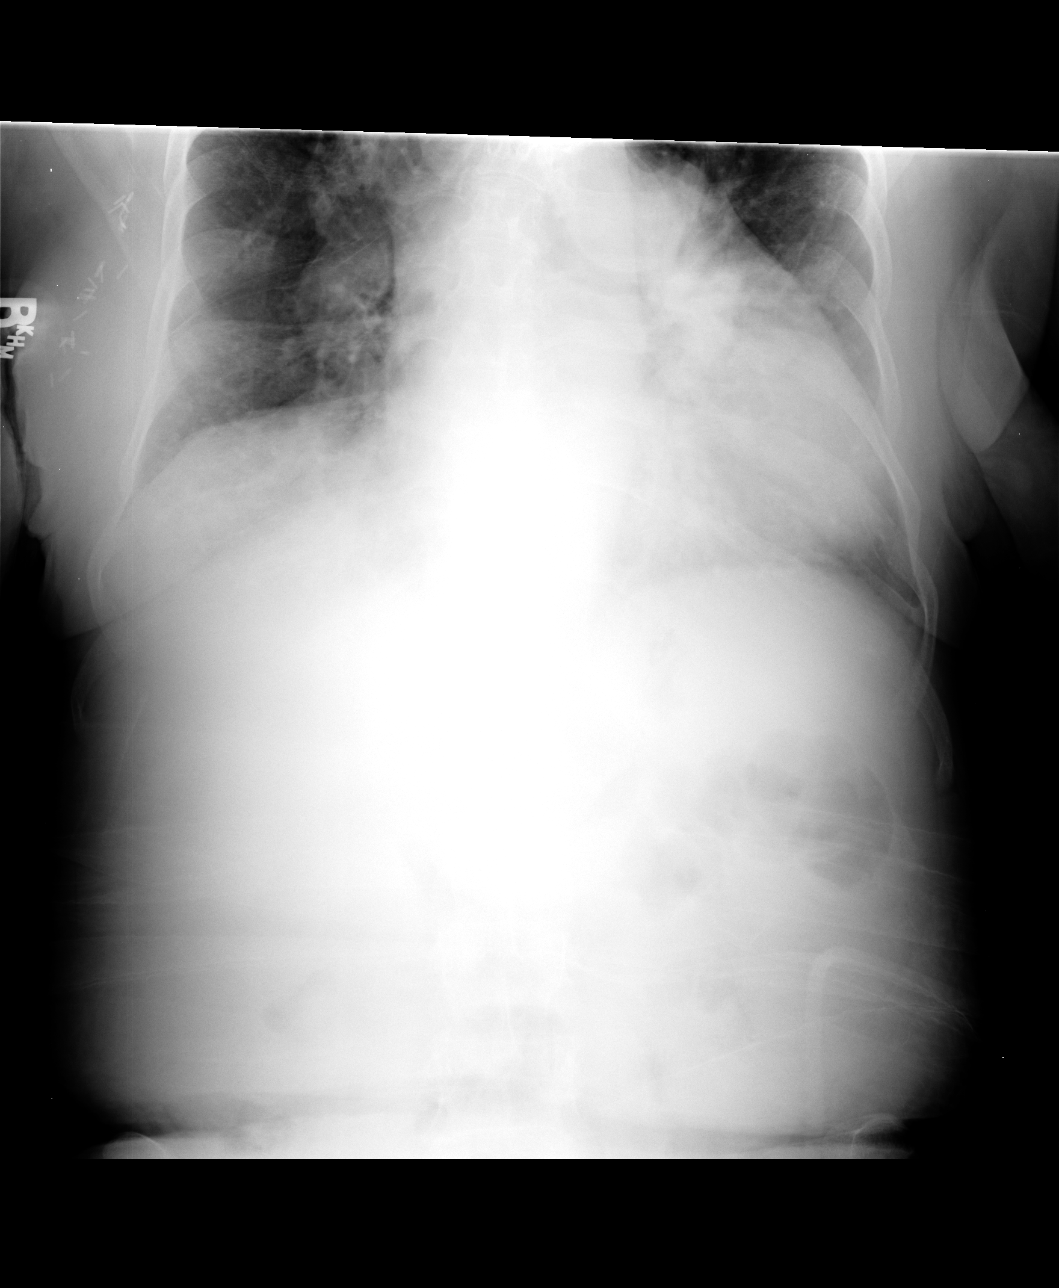

[view not recorded (3 of 3)]
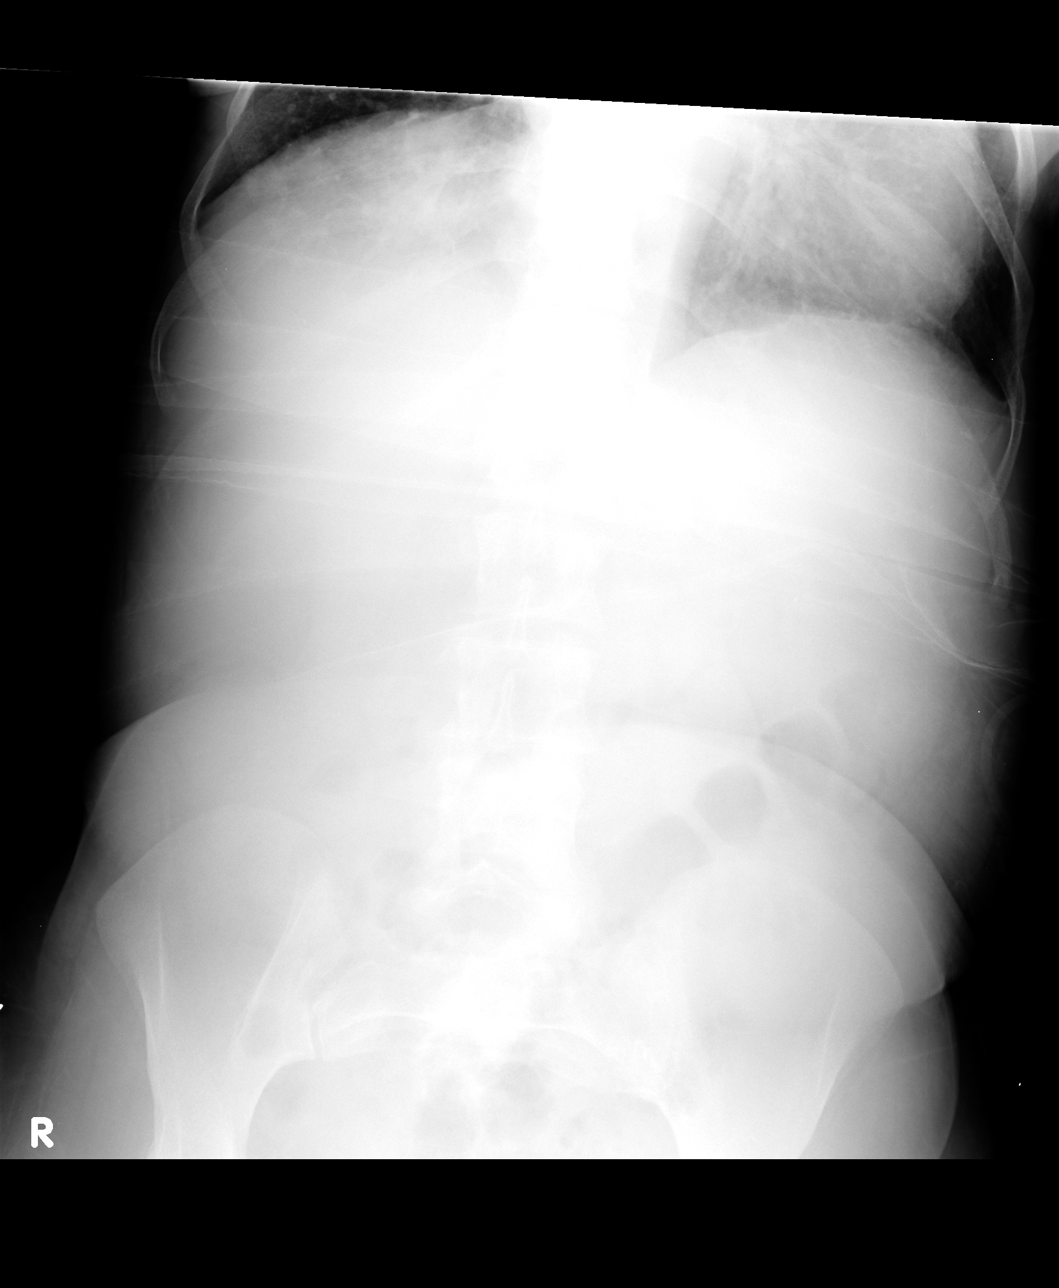

[3 of 3 positions shown; findings below may reference images not displayed]

FINDINGS: Interval enlargement of the cardiac silhouette.  There is
interval increase in right suprahilar adenopathy. Increase in left
perihilar adenopathy additional.   No evidence of focal
consolidation.  There is central venous congestion.

No dilated loops of large small bowel.  This gas the rectosigmoid
colon.  No evidence intraperitoneal free air.
IMPRESSION: .
1.  Interval increase in cardiac silhouette consistent with
cardiomegaly versus pericardial effusion.
2.  Interval increase in perihilar lymphadenopathy.
3.  No evidence of bowel obstruction intraperitoneal free air.

## 2009-01-21 ENCOUNTER — Encounter (INDEPENDENT_AMBULATORY_CARE_PROVIDER_SITE_OTHER): Payer: Self-pay | Admitting: Internal Medicine

## 2009-01-21 ENCOUNTER — Encounter: Payer: Self-pay | Admitting: Internal Medicine

## 2009-01-22 IMAGING — CT CT CHEST W/ CM
2 of 5 series · 12 of 36 positions shown, 15 images · non-contrast
Comparison: Chest CT [DATE] and abdominal CT [DATE]

CT CHEST

CLINICAL DATA: Persistent shortness of breath, abdominal pain,
nausea, vomiting and diarrhea.  History of lymphoma and end-stage
renal disease.

CT CHEST, ABDOMEN AND PELVIS WITHOUT CONTRAST
TECHNIQUE: Multidetector CT imaging of the chest, abdomen and
pelvis was performed following the standard protocol without IV
contrast.

[Series 3: cap with 5.0 b40f · axial · 0.60mm/px · z∈[-622,-98]mm · 9 of 123 slices shown, 12 images]
[im 9/123  mediastinal]
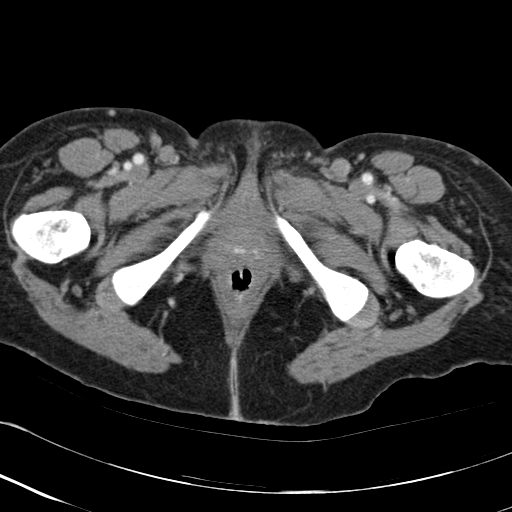
[im 9/123  lung]
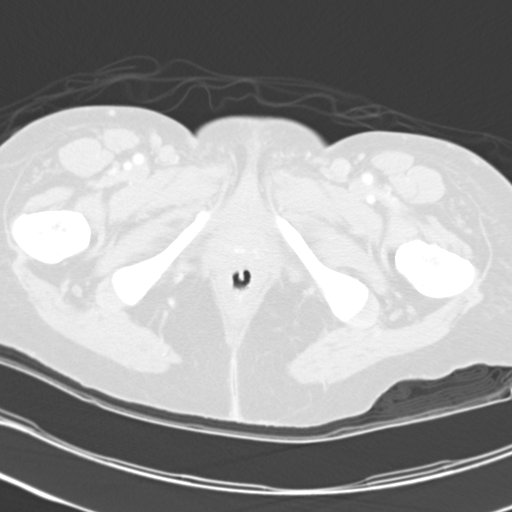
[im 25/123  lung]
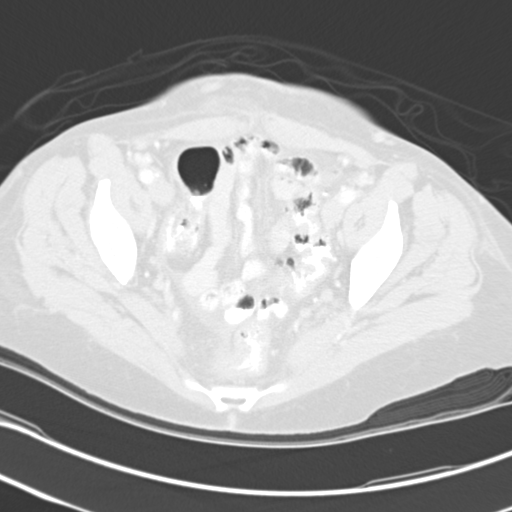
[im 33/123  lung]
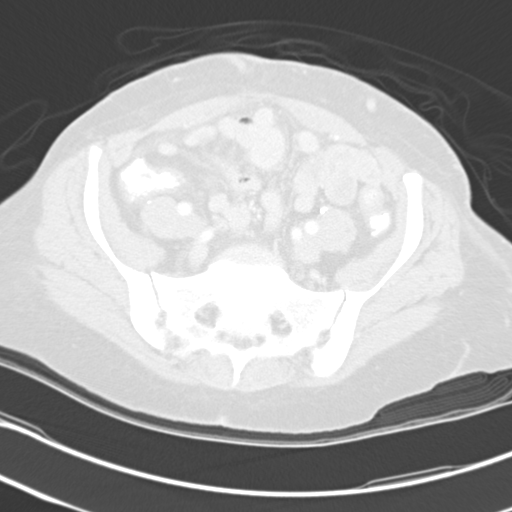
[im 49/123  lung]
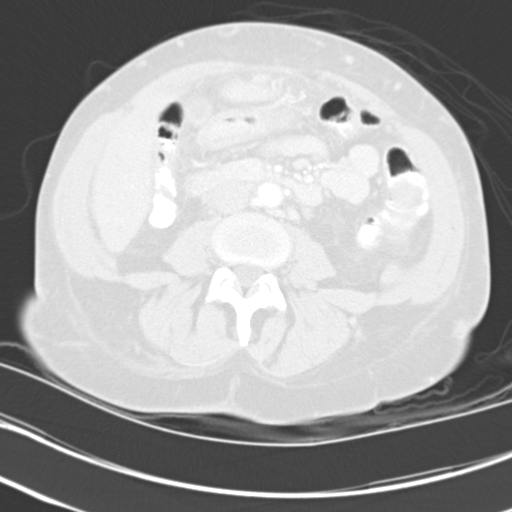
[im 66/123  mediastinal]
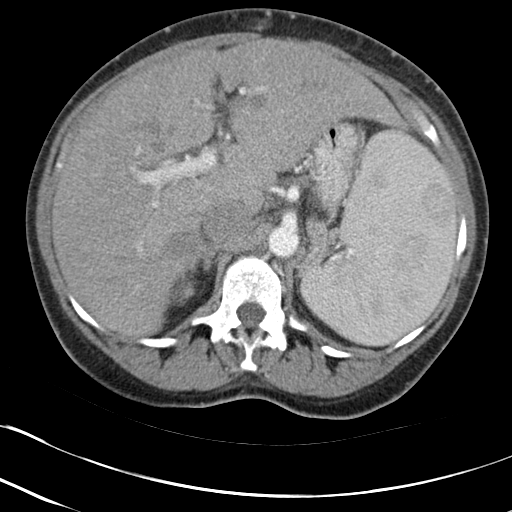
[im 66/123  lung]
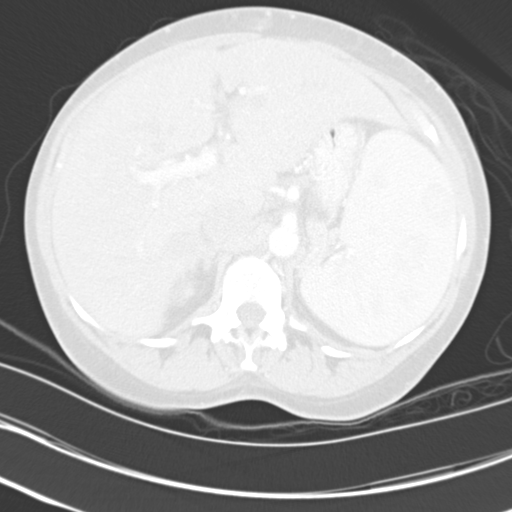
[im 74/123  lung]
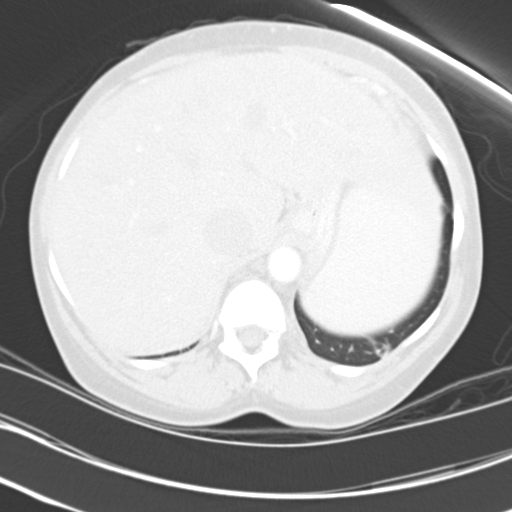
[im 90/123  lung]
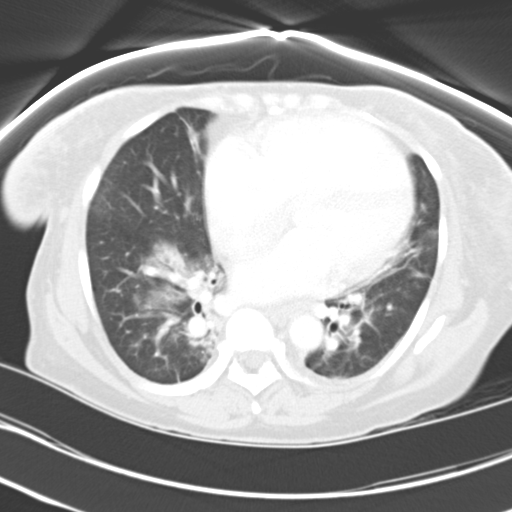
[im 98/123  lung]
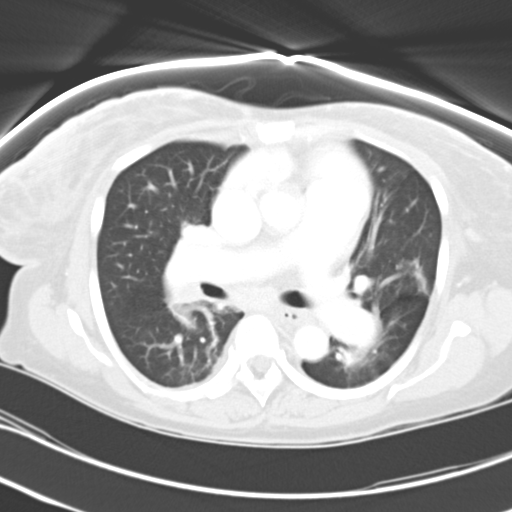
[im 114/123  mediastinal]
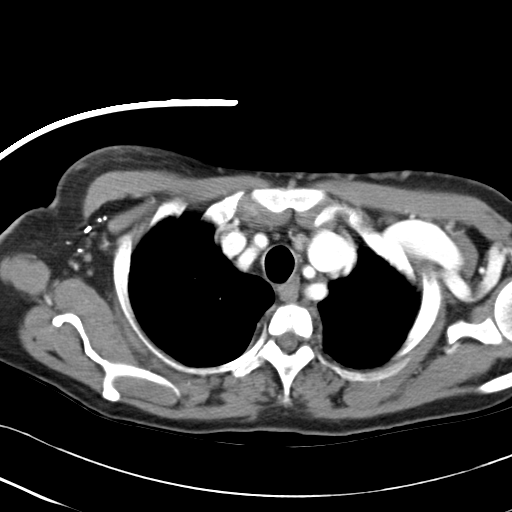
[im 114/123  lung]
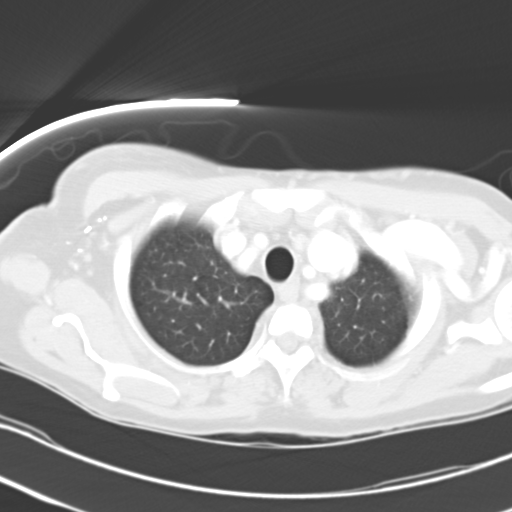

[Series 5: mpr cor post contrast (id) · coronal · 0.60mm/px · 3 of 75 slices shown]
[im 15/75  lung]
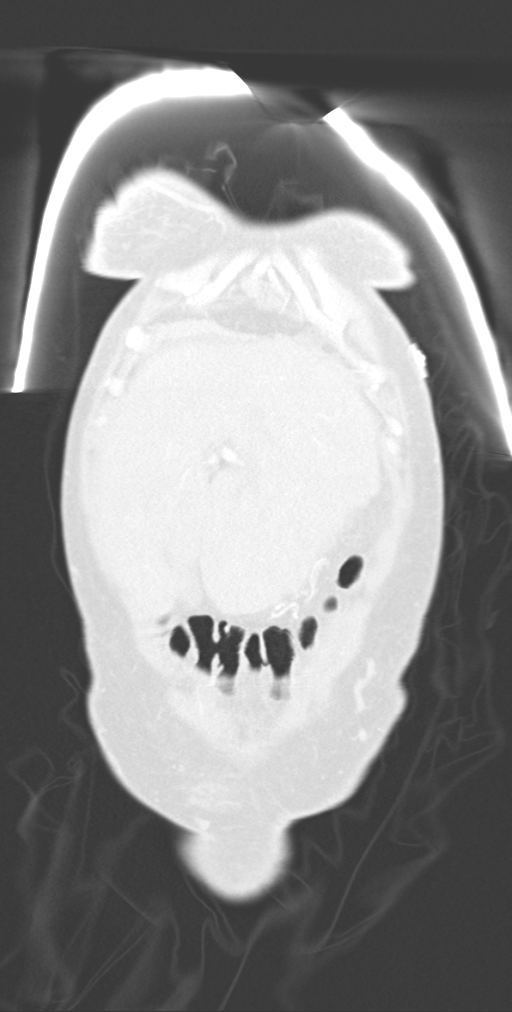
[im 30/75  lung]
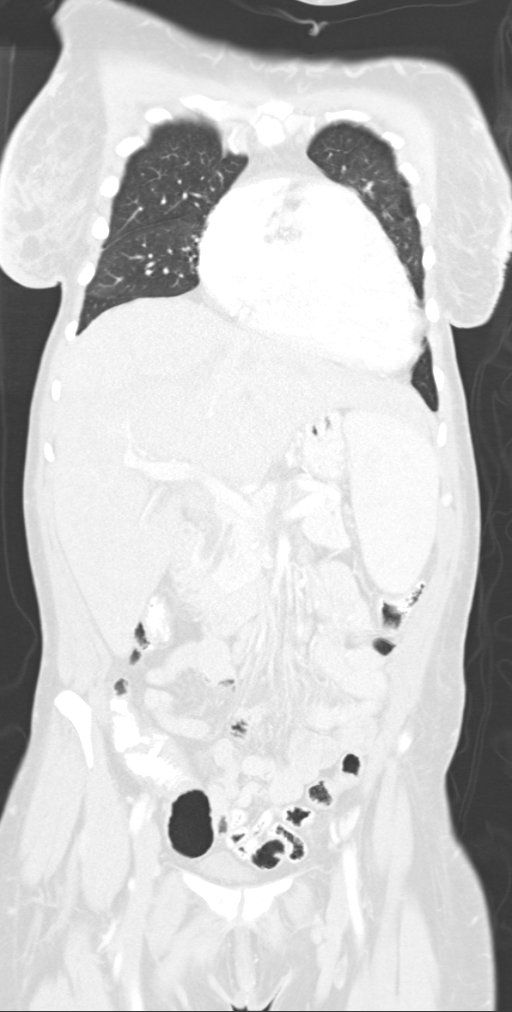
[im 45/75  lung]
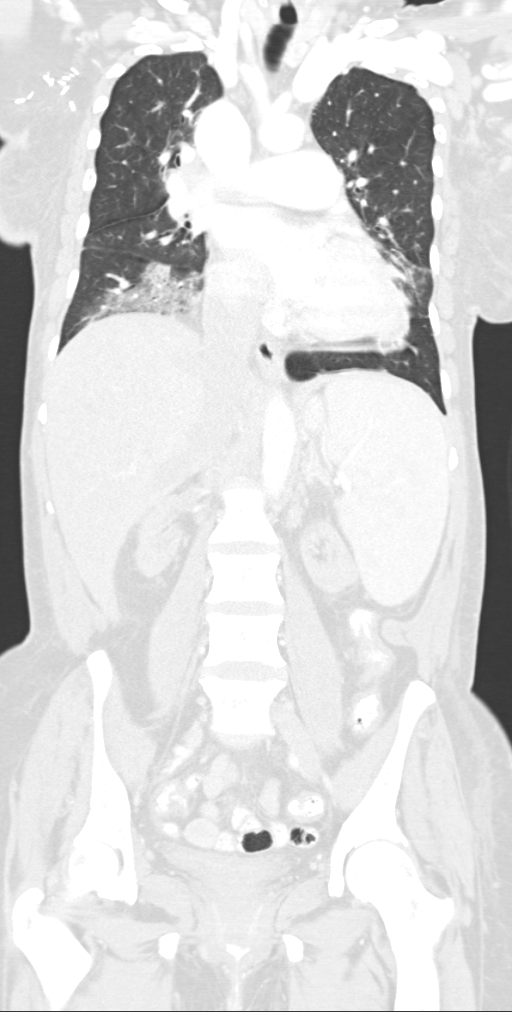

[12 of 36 positions shown; findings below may reference images not displayed]

FINDINGS: The chest wall is unremarkable and stable.  Surgical
clips noted the right axilla.  No supraclavicular or axillary
adenopathy.  The bony thorax is intact.The heart is mildly enlarged
but stable.  No pericardial effusion.  There are small scattered
mediastinal and hilar lymph nodes but no adenopathy.  The aorta is
normal in caliber.  No dissection.  The pulmonary arteries are
quite enlarged suggesting pulmonary hypertension.  The esophagus is
grossly normal.

Examination of the lung parenchyma demonstrates dense ground-glass
opacity in the right lower lobe most likely representing pneumonia.
The prior chest CT showed a mass-like density in the left upper
lobe but this has resolved with minimal residual probable post
pneumonic scarring changes.  No worrisome pulmonary nodules or mass
lesions.  No pleural effusions.  Minimal streaky left basilar
atelectasis.
IMPRESSION: 1.  Mild stable cardiac enlargement.
2.  Enlarged pulmonary arteries consistent with pulmonary
hypertension.
3.  Right lower lobe pneumonia.
4.  Patchy areas of atelectasis and scarring.

CT ABDOMEN
FINDINGS: The liver and spleen are enlarged.  The spleen measures
13 x 13 x 9 cm and the liver measures approximately 20 x 19 cm.
There are splenic lesions and lymphoma is certainly a
consideration.  No significant change since the prior examinations.
The kidneys are small and demonstrate hypoperfusion.  The pancreas
is unremarkable.  Mild gallbladder wall thickening is noted.  The
stomach, duodenum, small bowel and colon demonstrate no significant
abnormalities.  There are enlarged, somewhat ill-defined and low
attenuation mesenteric and retroperitoneal lymph nodes. These are
not as prominent as they were on the prior study.  Stable mild
common bile duct dilatation.  No significant bony findings.
IMPRESSION: 1.  Stable hepatic splenomegaly with splenic lesions.
2.  Abdominal adenopathy not as pronounced as it was on the prior
CT scan.
3.  No acute abdominal findings.

CT PELVIS
FINDINGS: The rectum, sigmoid colon visualized small bowel loops
are grossly normal.  There is diverticulosis of the sigmoid colon.
The bladder is grossly normal.  There is a small amount of free
pelvic fluid.   A small paraumbilical hernia is noted containing
fat.  No inguinal mass or adenopathy.  Bony pelvis is intact.
IMPRESSION: 1.  No acute pelvic findings, mass lesions or adenopathy.
2.  Diverticulosis of the sigmoid colon.
3.  Small paraumbilical hernia containing fat.

## 2009-01-27 ENCOUNTER — Ambulatory Visit: Payer: Self-pay | Admitting: Internal Medicine

## 2009-01-27 IMAGING — CT CT HEAD W/O CM
1 of 2 series · 16 of 30 positions shown, 20 images · non-contrast
Comparison: [DATE]

CLINICAL DATA: Headache and dizziness.  Vomiting.  Decreased level
of consciousness.

CT HEAD WITHOUT CONTRAST
TECHNIQUE: Contiguous axial images were obtained from the base of
the skull through the vertex without contrast

[Series 2: headseq 4.8 h37s · axial · 0.43mm/px · z∈[+69,+194]mm · 16 of 30 slices shown, 20 images]
[im 2/30  brain]
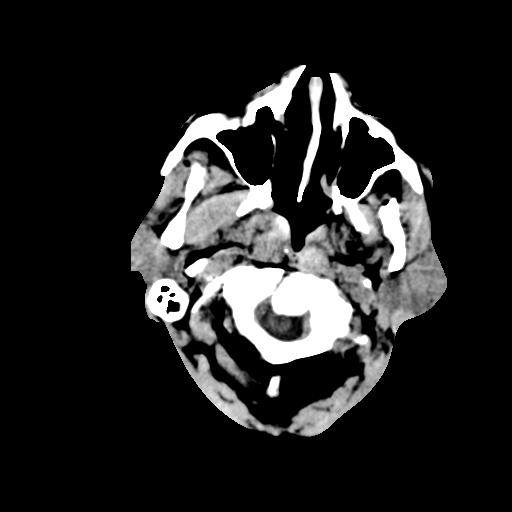
[im 2/30  bone]
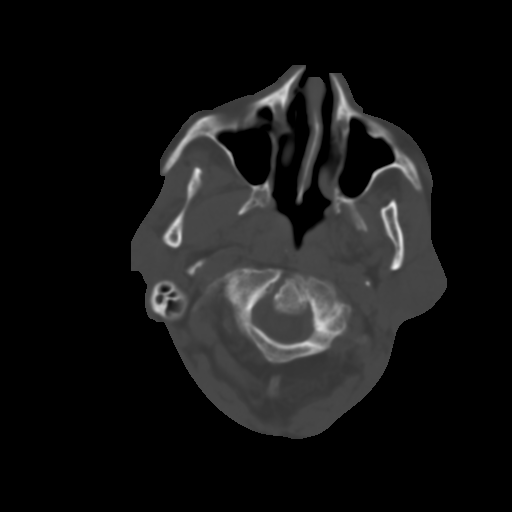
[im 3/30  brain]
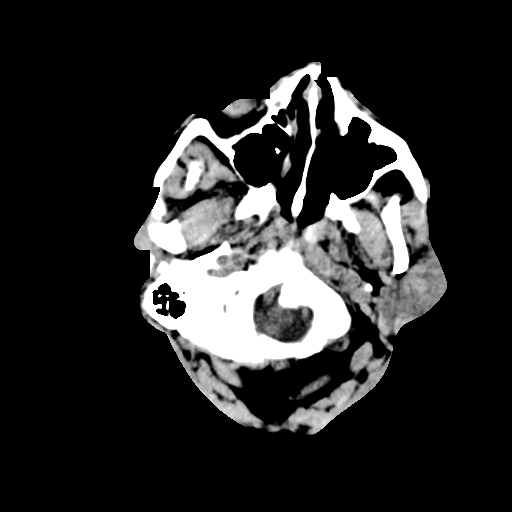
[im 5/30  brain]
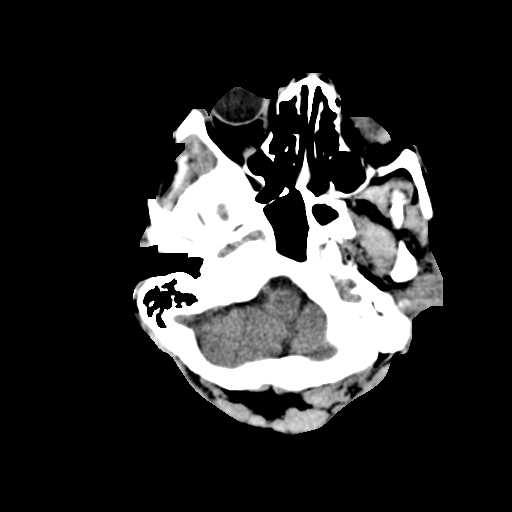
[im 8/30  brain]
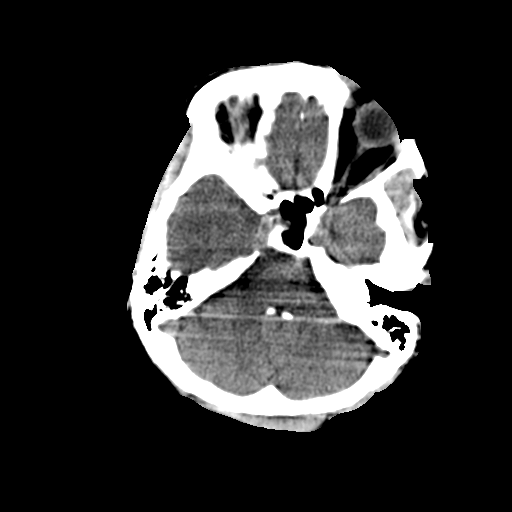
[im 9/30  brain]
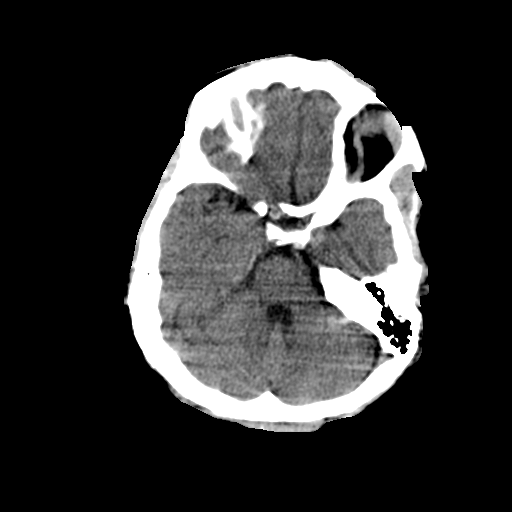
[im 9/30  bone]
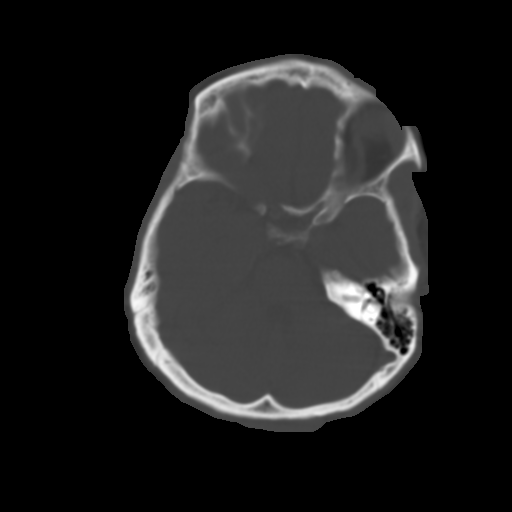
[im 11/30  brain]
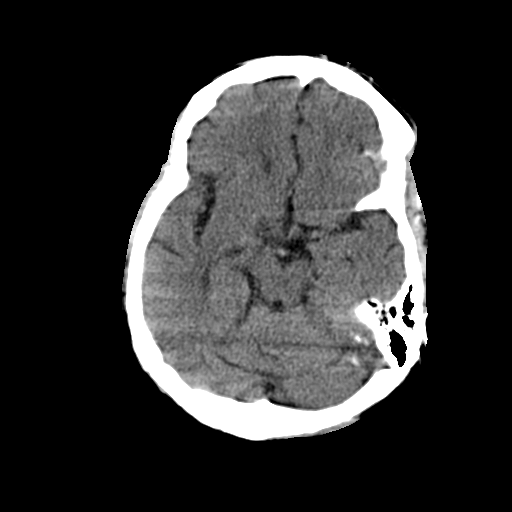
[im 12/30  brain]
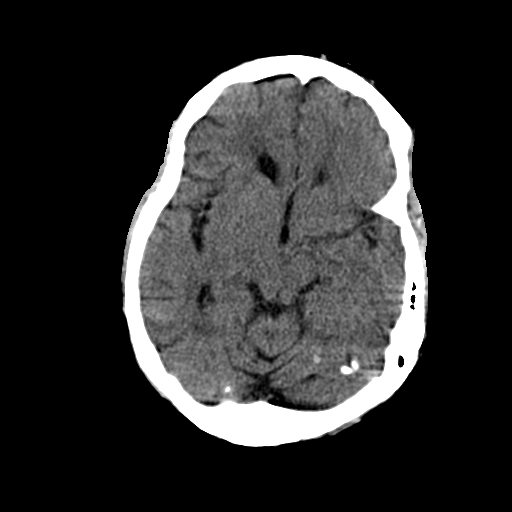
[im 14/30  brain]
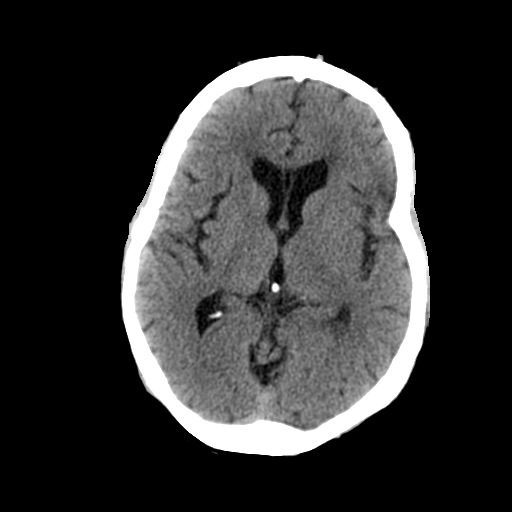
[im 16/30  brain]
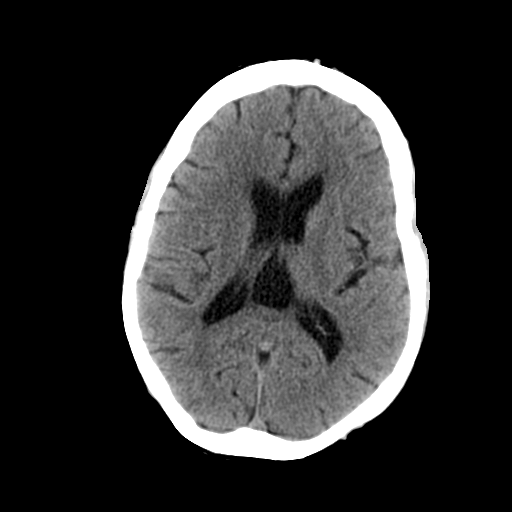
[im 16/30  bone]
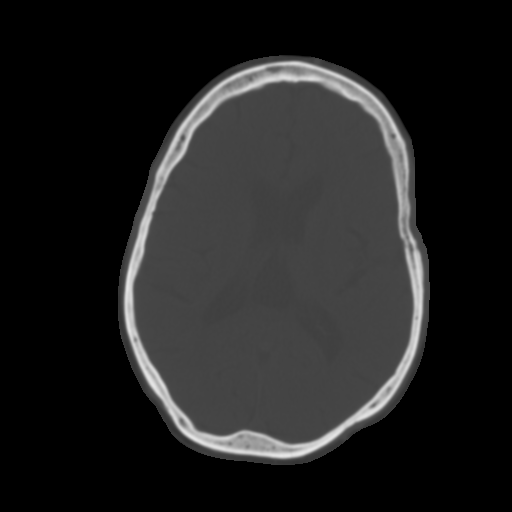
[im 18/30  brain]
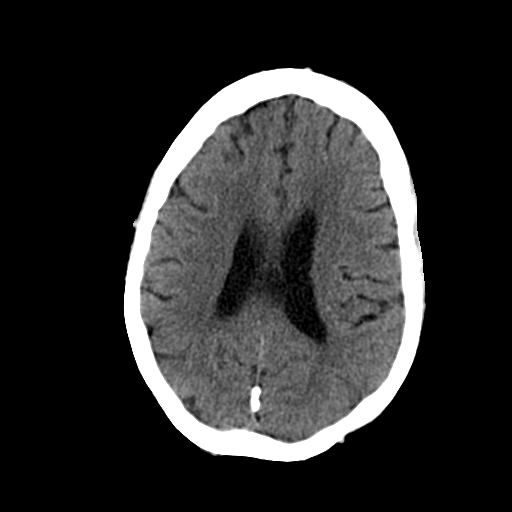
[im 19/30  brain]
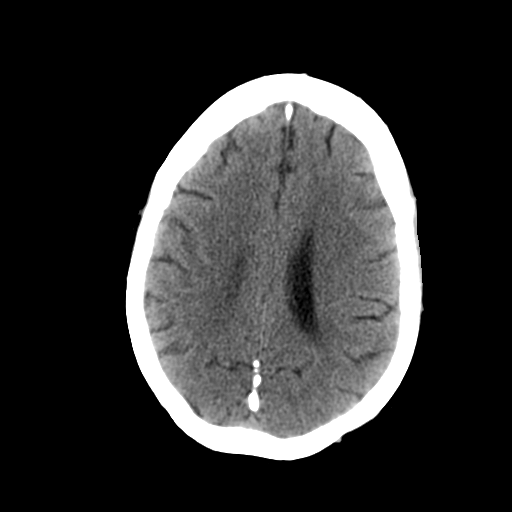
[im 21/30  brain]
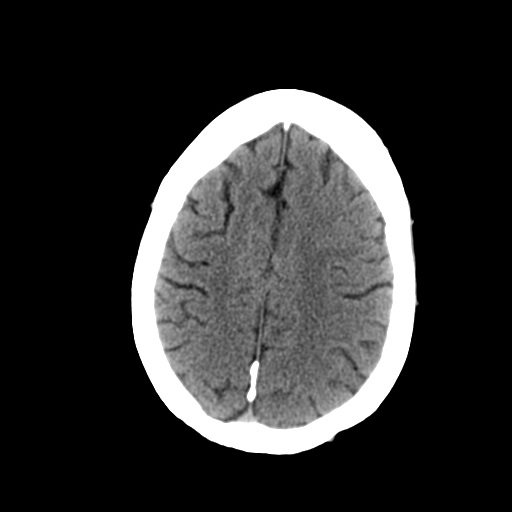
[im 22/30  brain]
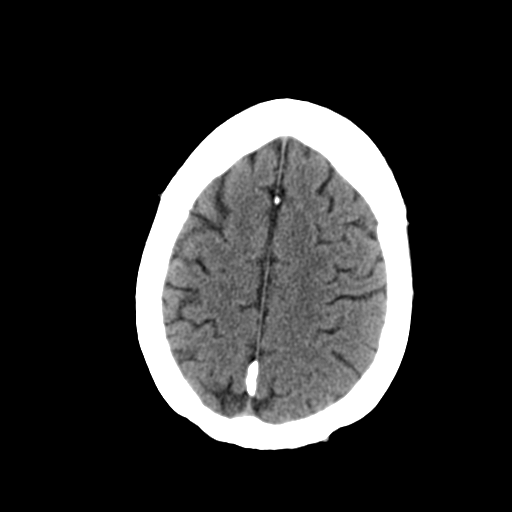
[im 22/30  bone]
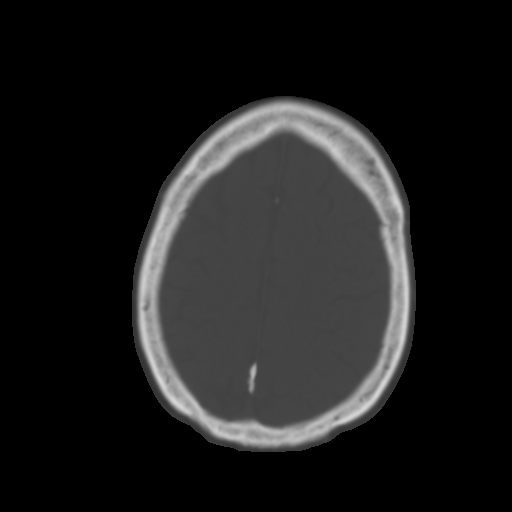
[im 25/30  brain]
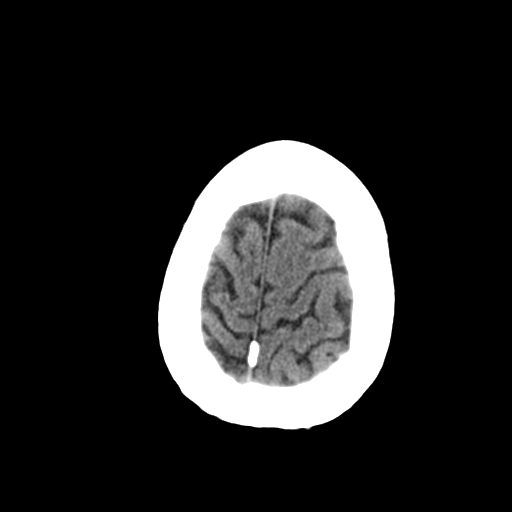
[im 27/30  brain]
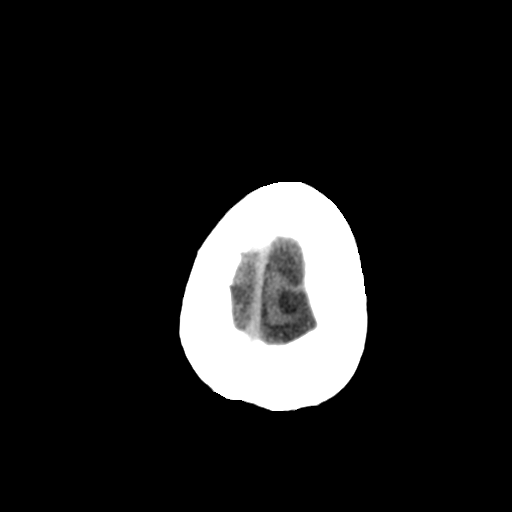
[im 28/30  brain]
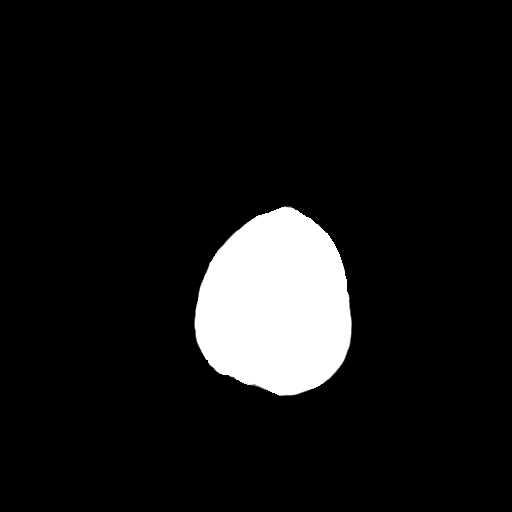

[16 of 30 positions shown; findings below may reference images not displayed]

FINDINGS: There is no evidence of intracranial hemorrhage, brain
edema, or other signs of acute infarction.  There is no evidence of
intracranial mass lesion or mass effect.  No abnormal extraaxial
fluid collections are identified.  There is no evidence of
hydrocephalus, or other significant intracranial abnormality.  No
skull abnormality identified.
IMPRESSION: Negative non-contrast head CT.

## 2009-01-27 IMAGING — CR DG ABDOMEN ACUTE W/ 1V CHEST
3 series · 3 of 3 positions shown · non-contrast
Comparison: [DATE]

CLINICAL DATA: Abdominal pain and distention.  Vomiting diarrhea.
Hyperkalemia.

ACUTE ABDOMEN SERIES (ABDOMEN 2 VIEW & CHEST 1 VIEW)

[view not recorded (1 of 3)]
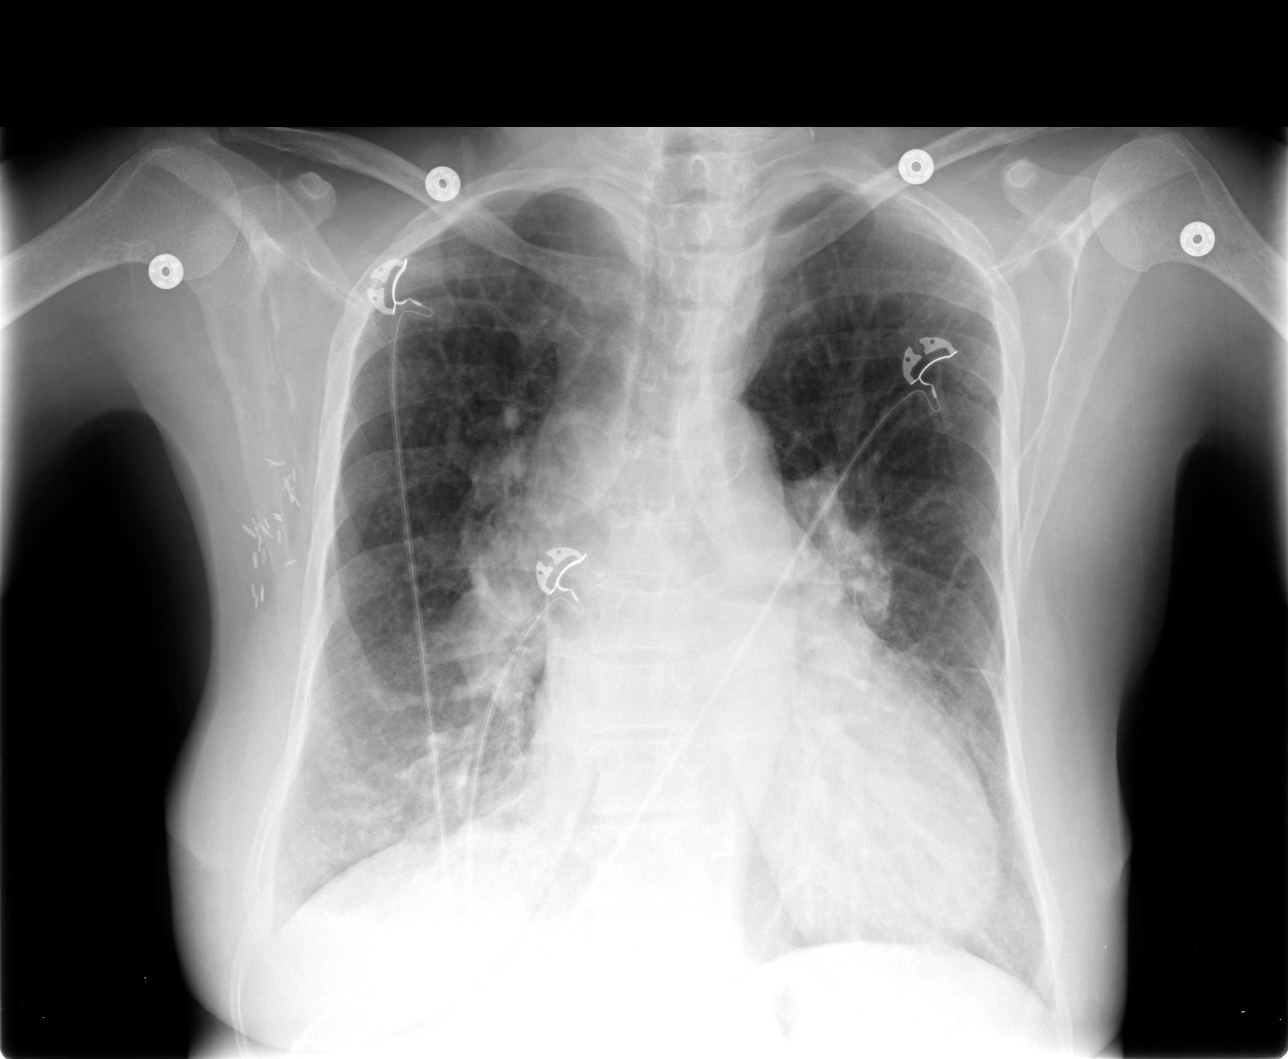

[view not recorded (2 of 3)]
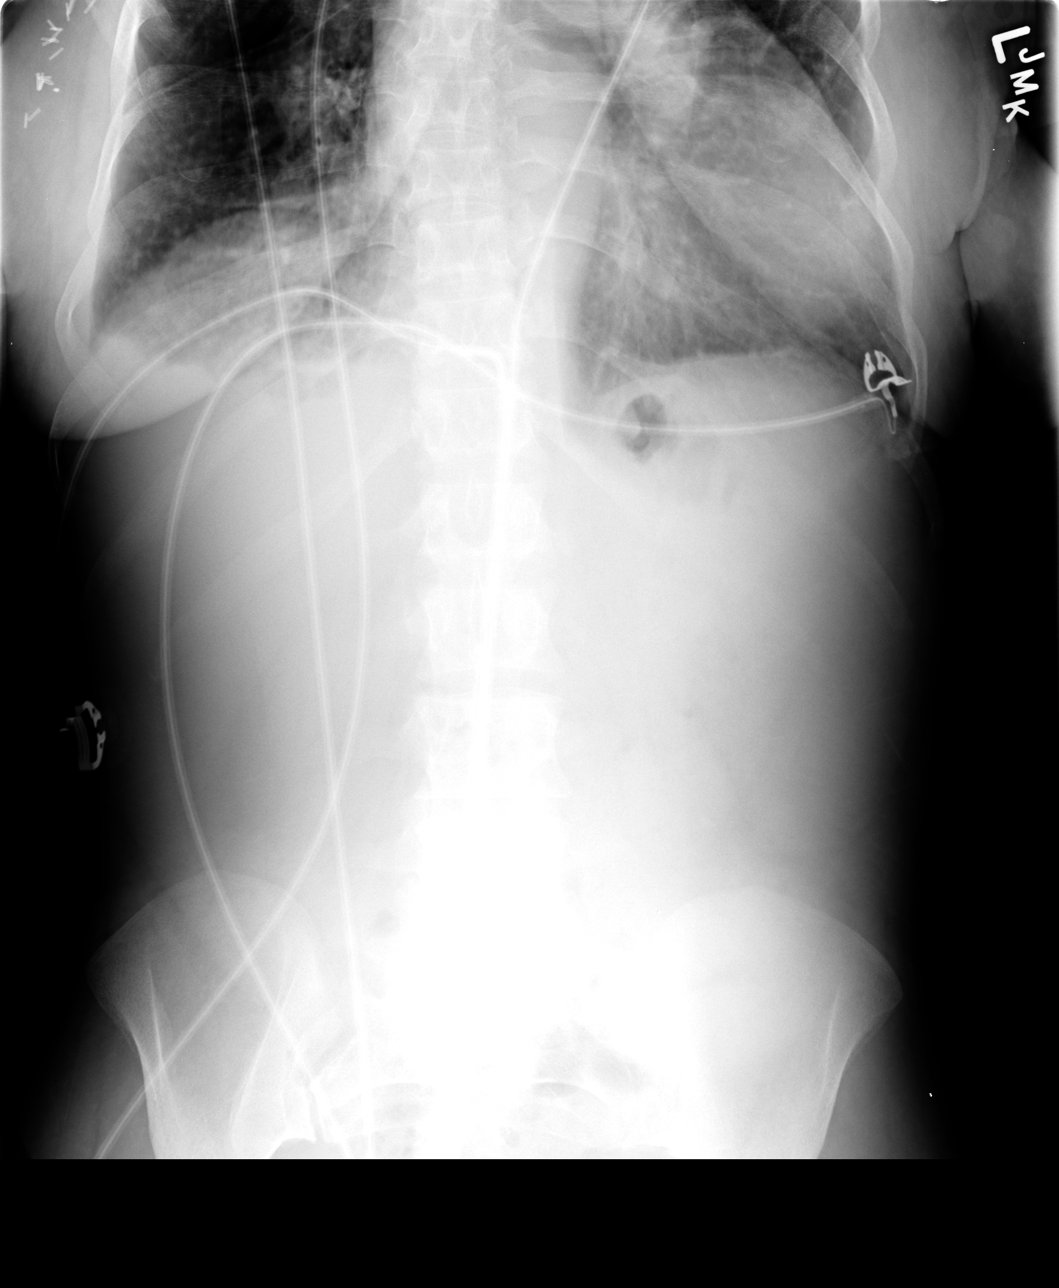

[view not recorded (3 of 3)]
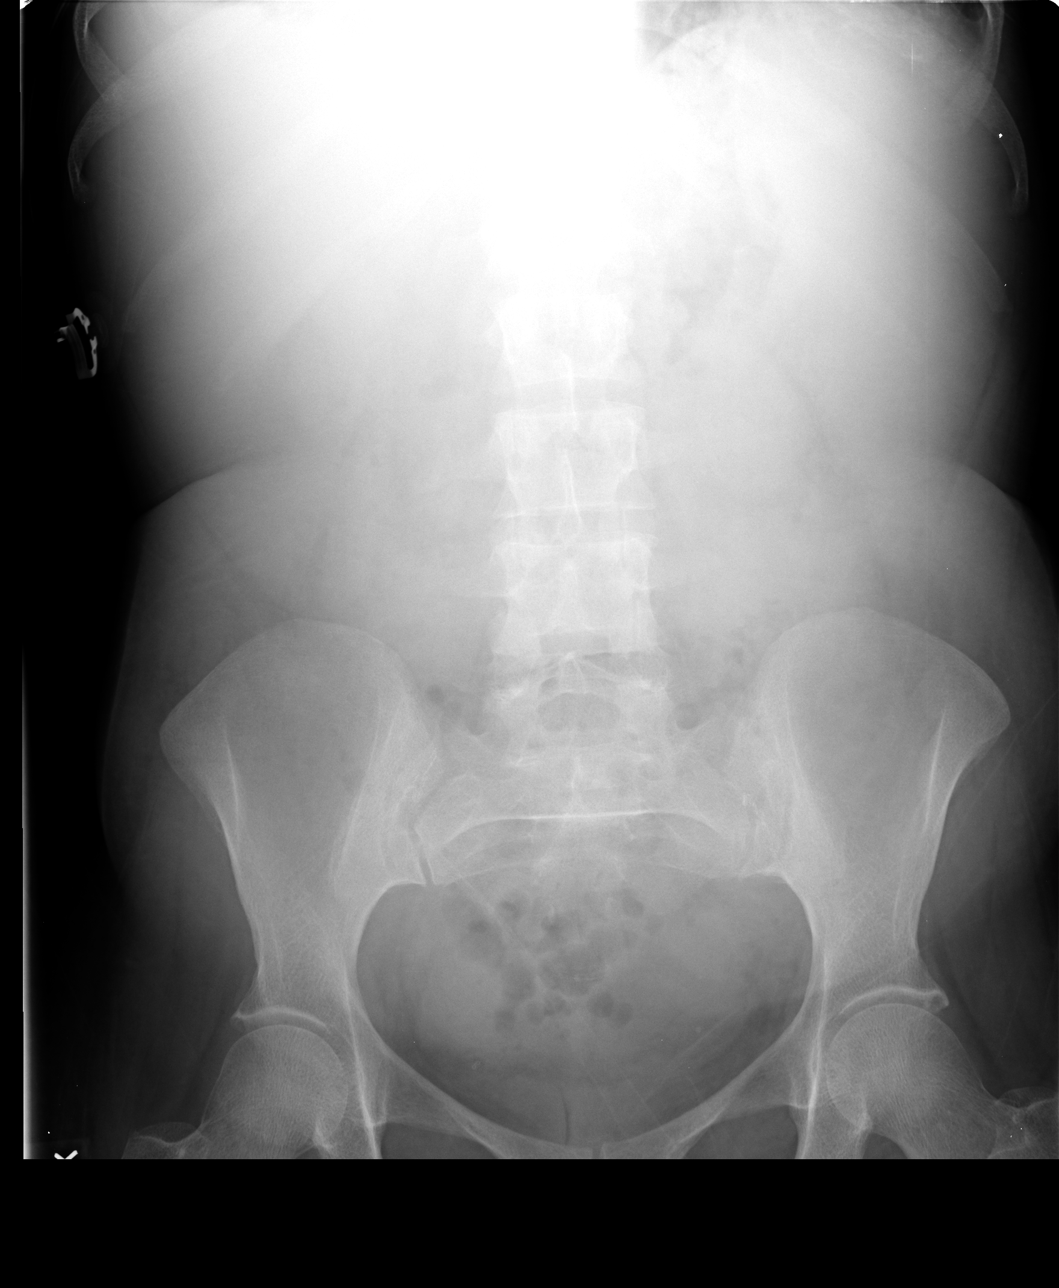

[3 of 3 positions shown; findings below may reference images not displayed]

FINDINGS: The bowel gas pattern is normal.  No free air identified.

Moderate to severe cardiomegaly is stable, as well as enlargement
central pulmonary arteries consistent with pulmonary arterial
hypertension.

New infiltrate is seen in the right lower lobe as well as tiny
right pleural effusion.  This is suspicious for pneumonia.
IMPRESSION: 1.  New mild right lower lobe infiltrate and tiny right pleural
effusion, suspicious for pneumonia.
2.  Stable cardiomegaly and pulmonary arterial hypertension.

## 2009-01-28 ENCOUNTER — Ambulatory Visit: Payer: Self-pay | Admitting: Gastroenterology

## 2009-01-29 IMAGING — CR DG CHEST 1V PORT
1 series · 1 of 1 positions shown · non-contrast
Comparison: Portable exam [1L] hours compared to [DATE]

CLINICAL DATA: Nausea, vomiting, diarrhea, shortness of breath

PORTABLE CHEST - 1 VIEW

[view not recorded]
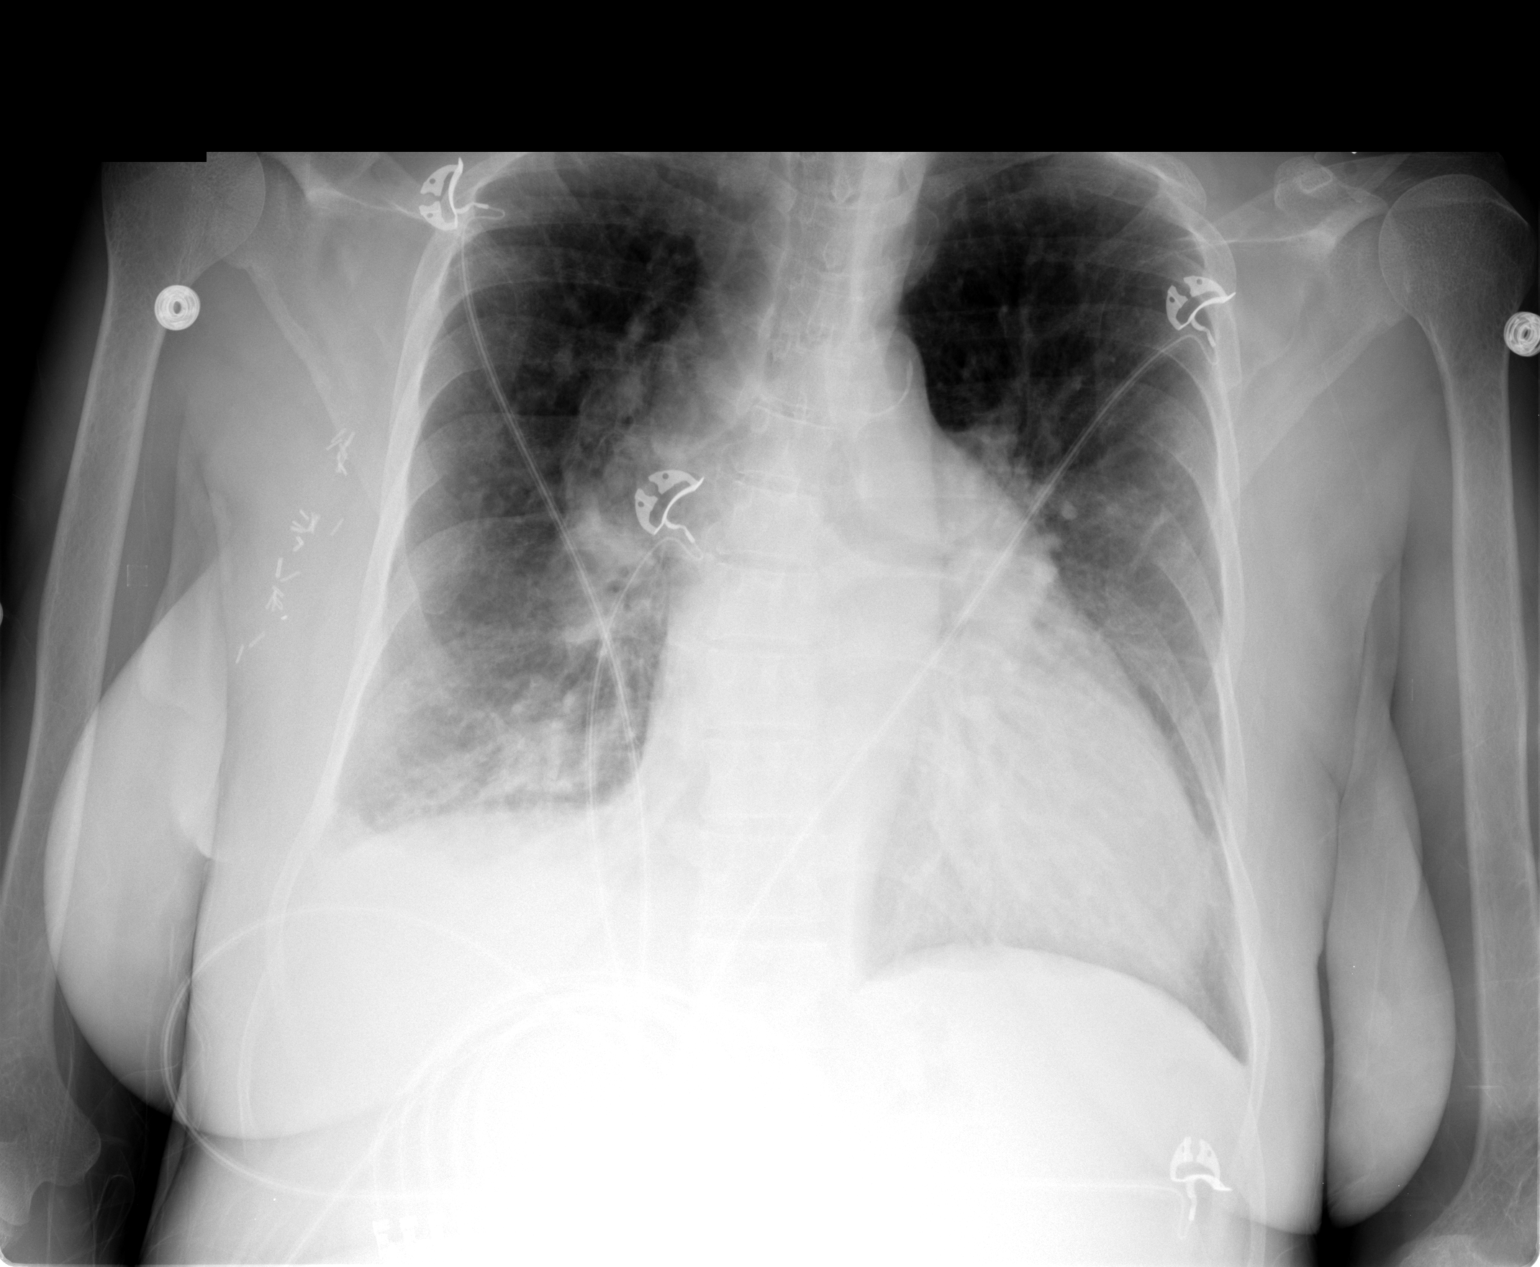

[1 of 1 positions shown; findings below may reference images not displayed]

FINDINGS: Cardiac enlargement.
Enlargement of the right pulmonary hilum and central pulmonary
arteries is unchanged.
Pulmonary vascular congestion.
Right lower lobe infiltrate.
Mild peribronchial thickening.
Remaining lungs clear.
Atherosclerotic calcifications aortic arch.
No definite pleural effusion or pneumothorax.
IMPRESSION: Cardiomegaly with pulmonary vascular congestion.
Chronic enlargement of central pulmonary arteries compatible with
pulmonary arterial hypertension.
Right lower lobe infiltrate.

## 2009-01-29 IMAGING — CT CT HEAD WO/W CM
1 of 2 series · 13 of 30 positions shown, 17 images · IV contrast (omnipaque)
Comparison: [DATE]

CLINICAL DATA: Decreased mental status, vomitting, HIV+, end-stage
renal disease on dialysis

CT HEAD WITHOUT AND WITH CONTRAST
TECHNIQUE: Contiguous axial images were obtained from the base of
the skull through the vertex without and with intravenous contrast.
Contrast: 100 ml Omnipaque 300

[Series 2: head w/o 4.8 h37s · axial · non-contrast · 0.41mm/px · z∈[+108,+224]mm · 13 of 30 slices shown, 17 images]
[im 3/30  brain]
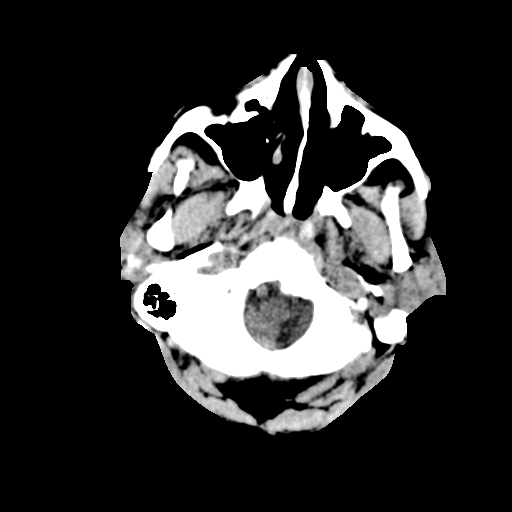
[im 3/30  bone]
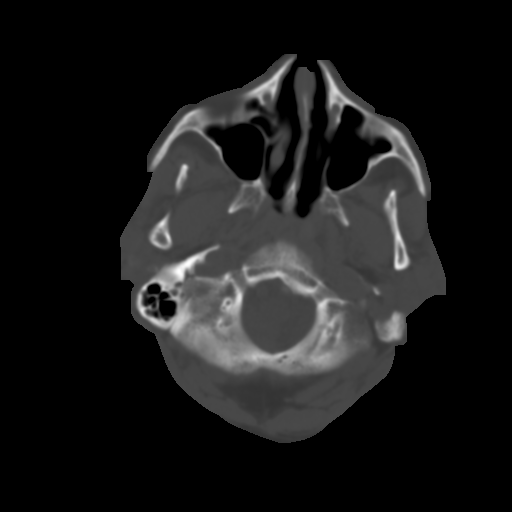
[im 5/30  brain]
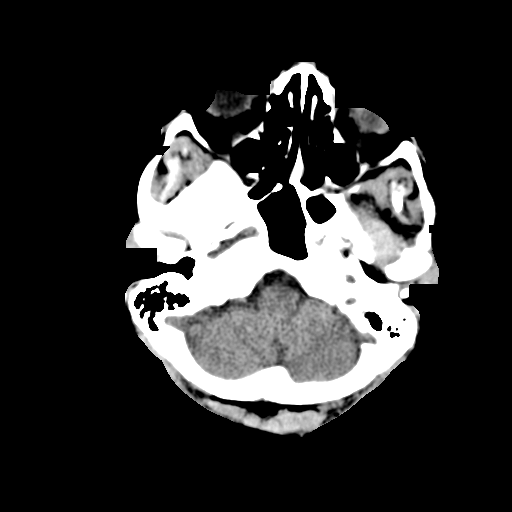
[im 7/30  brain]
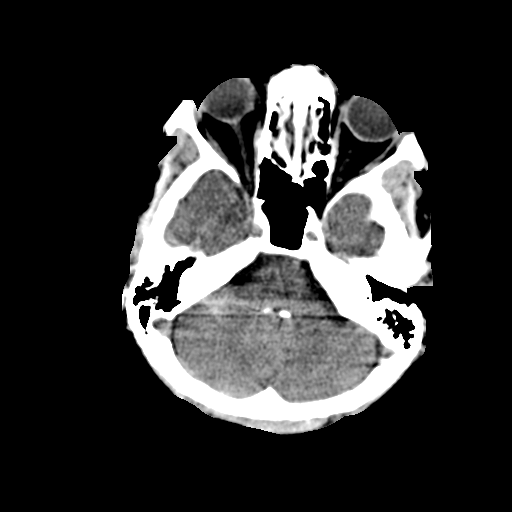
[im 9/30  brain]
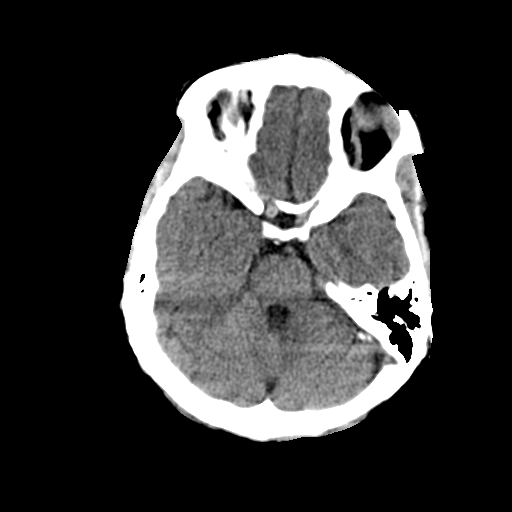
[im 11/30  brain]
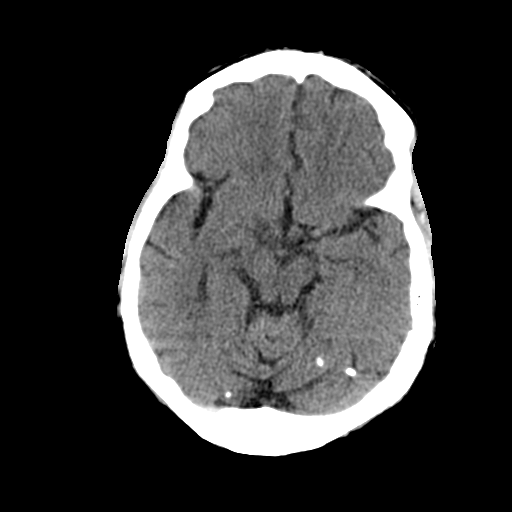
[im 11/30  bone]
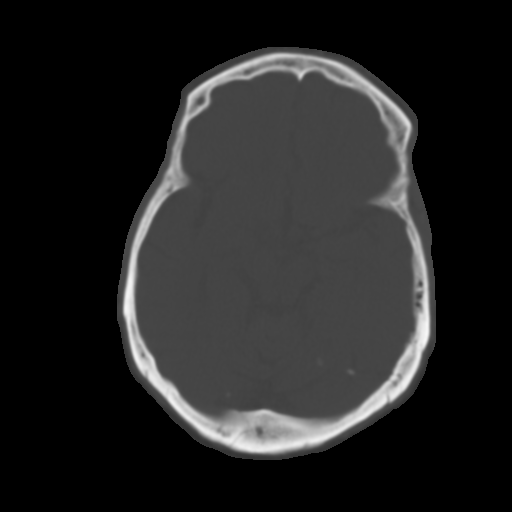
[im 13/30  brain]
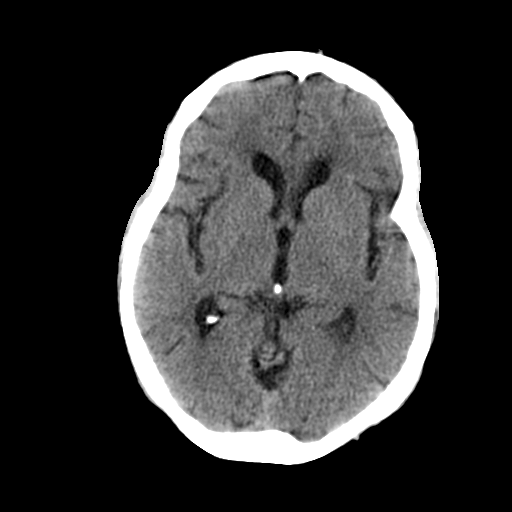
[im 15/30  brain]
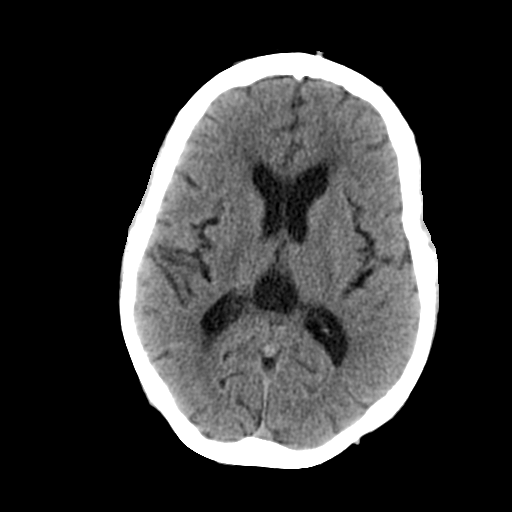
[im 17/30  brain]
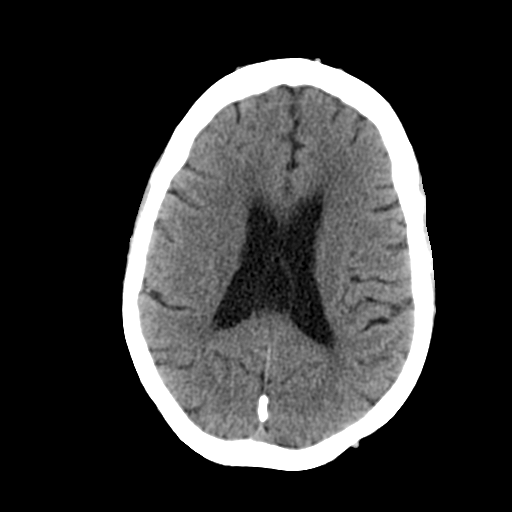
[im 19/30  brain]
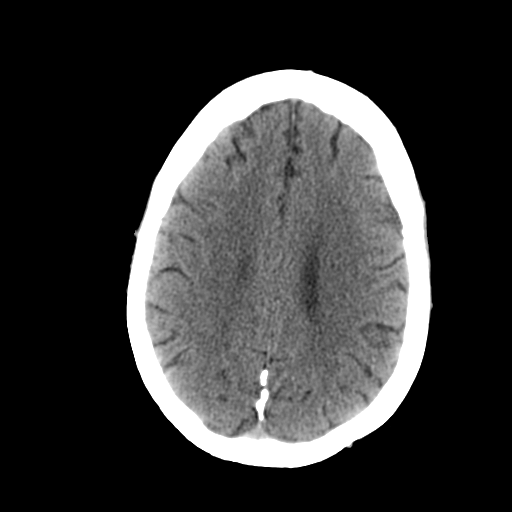
[im 19/30  bone]
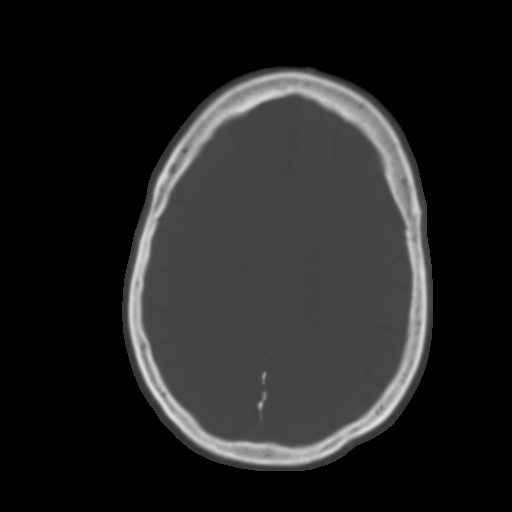
[im 21/30  brain]
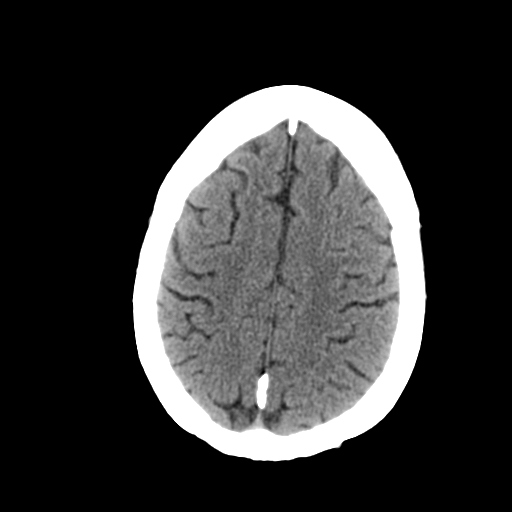
[im 23/30  brain]
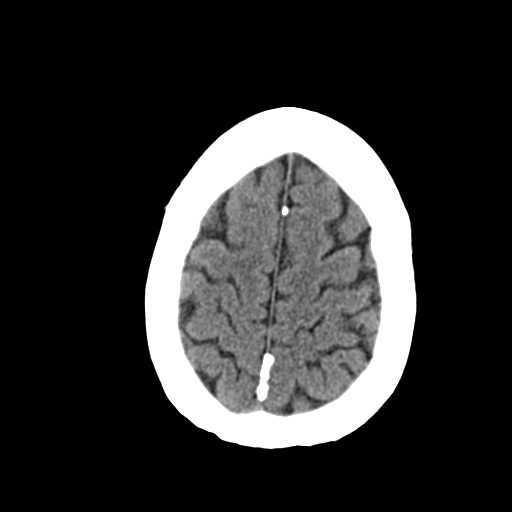
[im 25/30  brain]
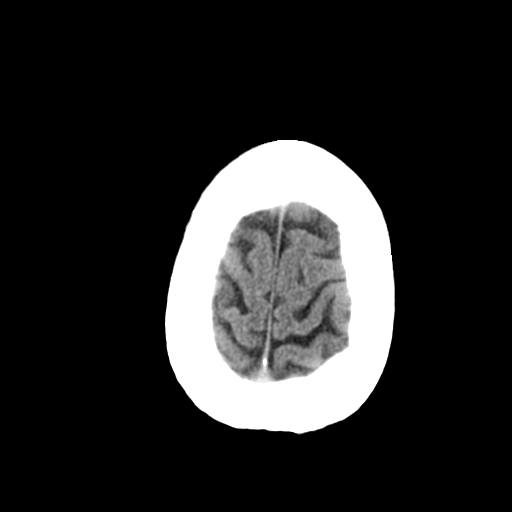
[im 27/30  brain]
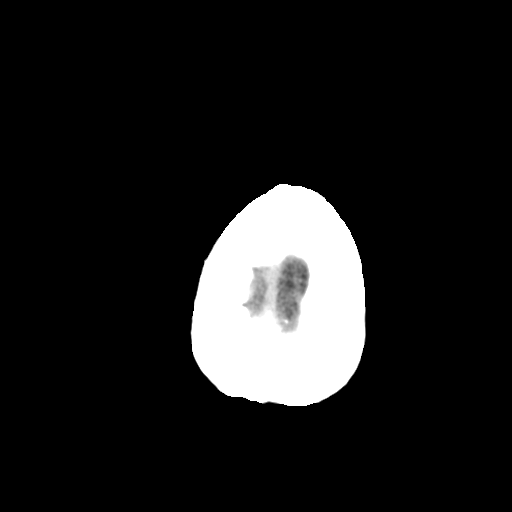
[im 27/30  bone]
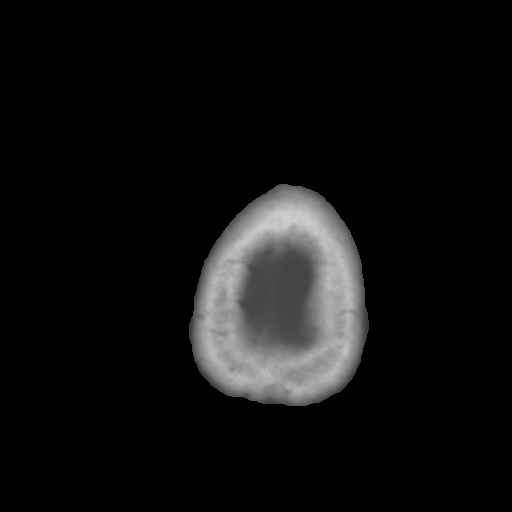

[13 of 30 positions shown; findings below may reference images not displayed]

FINDINGS: Cavum septum vergae.
Otherwise normal ventricular morphology.
No midline shift or mass effect.
Calcifications in falx stable.
Normal appearance of brain parenchyma.
No intracranial hemorrhage, mass lesion, or evidence of acute
infarction.
No pathologic intracranial enhancement identified following IV
contrast material to suggest intracranial abscess.
Sinuses clear and bones unremarkable.
IMPRESSION: No acute intracranial abnormalities.

## 2009-02-04 ENCOUNTER — Emergency Department (HOSPITAL_COMMUNITY): Admission: EM | Admit: 2009-02-04 | Discharge: 2009-02-04 | Payer: Self-pay | Admitting: Emergency Medicine

## 2009-02-04 IMAGING — CR DG CHEST 2V
2 series · 2 of 2 positions shown · non-contrast
Comparison: [DATE].

CLINICAL DATA: Short of breath.  Cough.

CHEST - 2 VIEW

[view not recorded (1 of 2)]
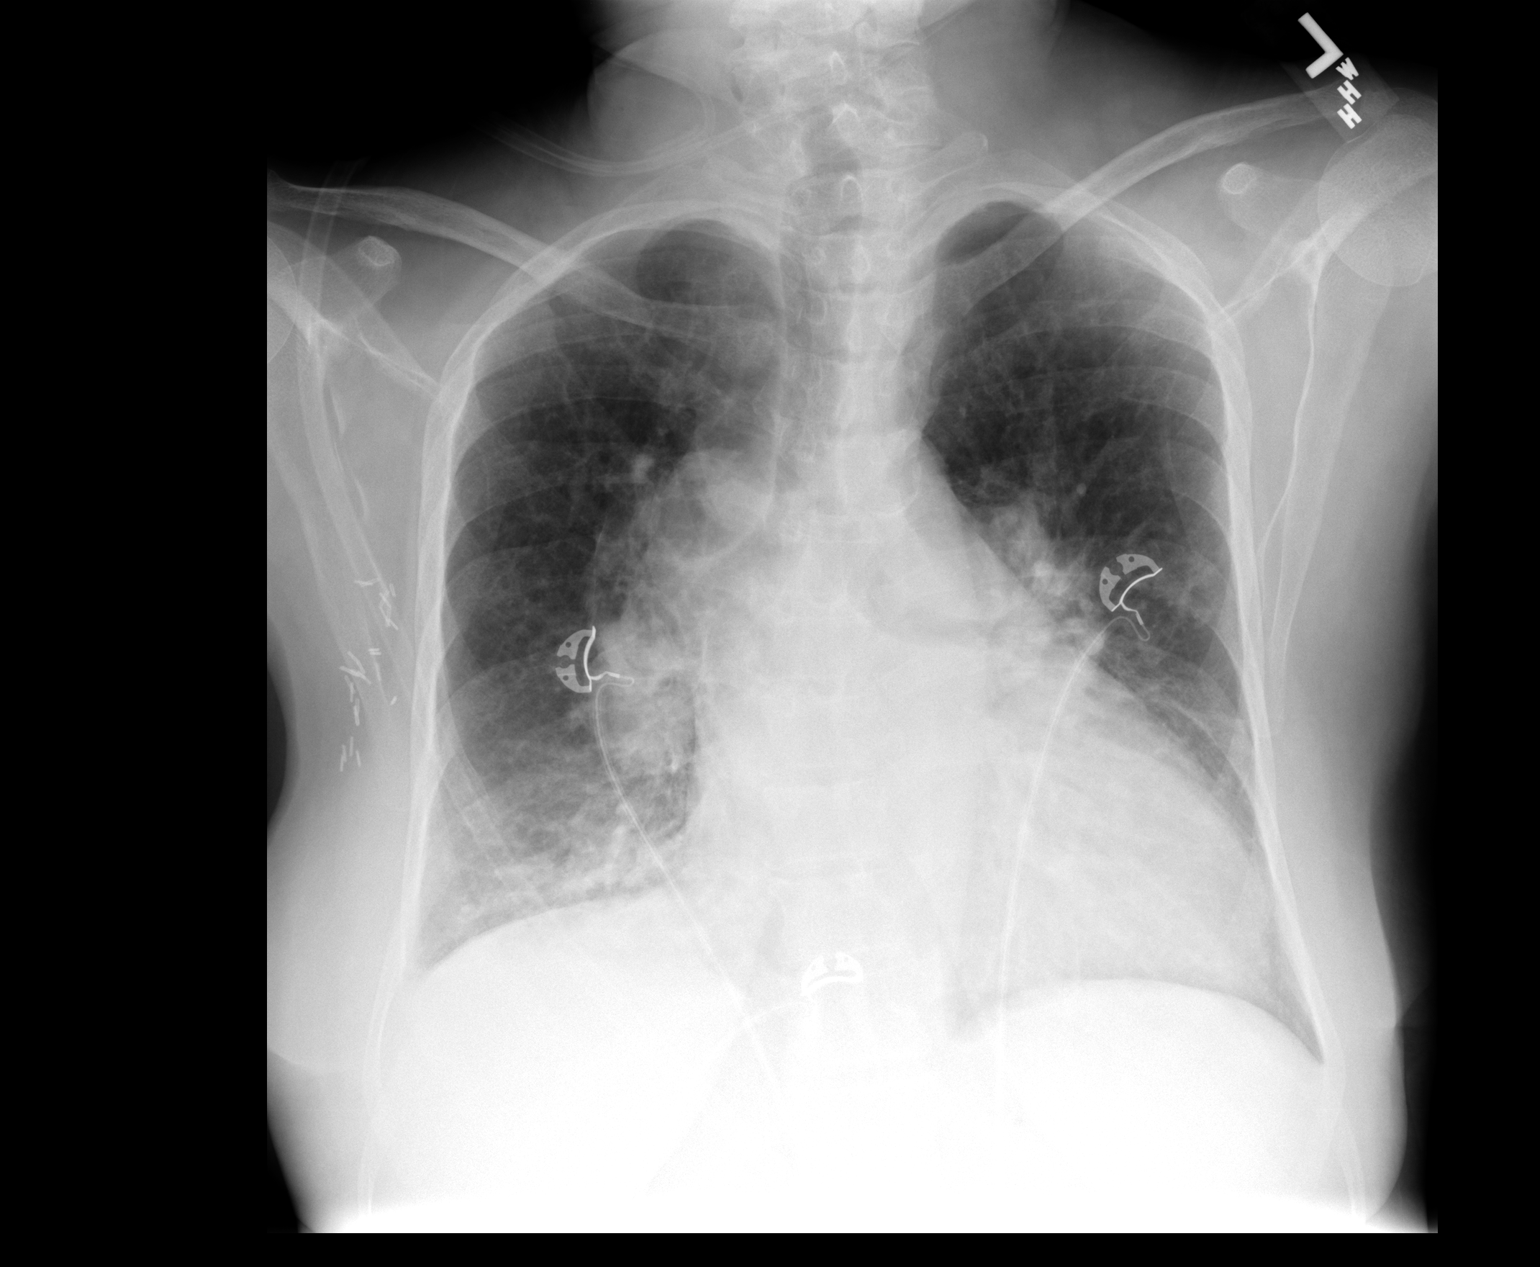

[view not recorded (2 of 2)]
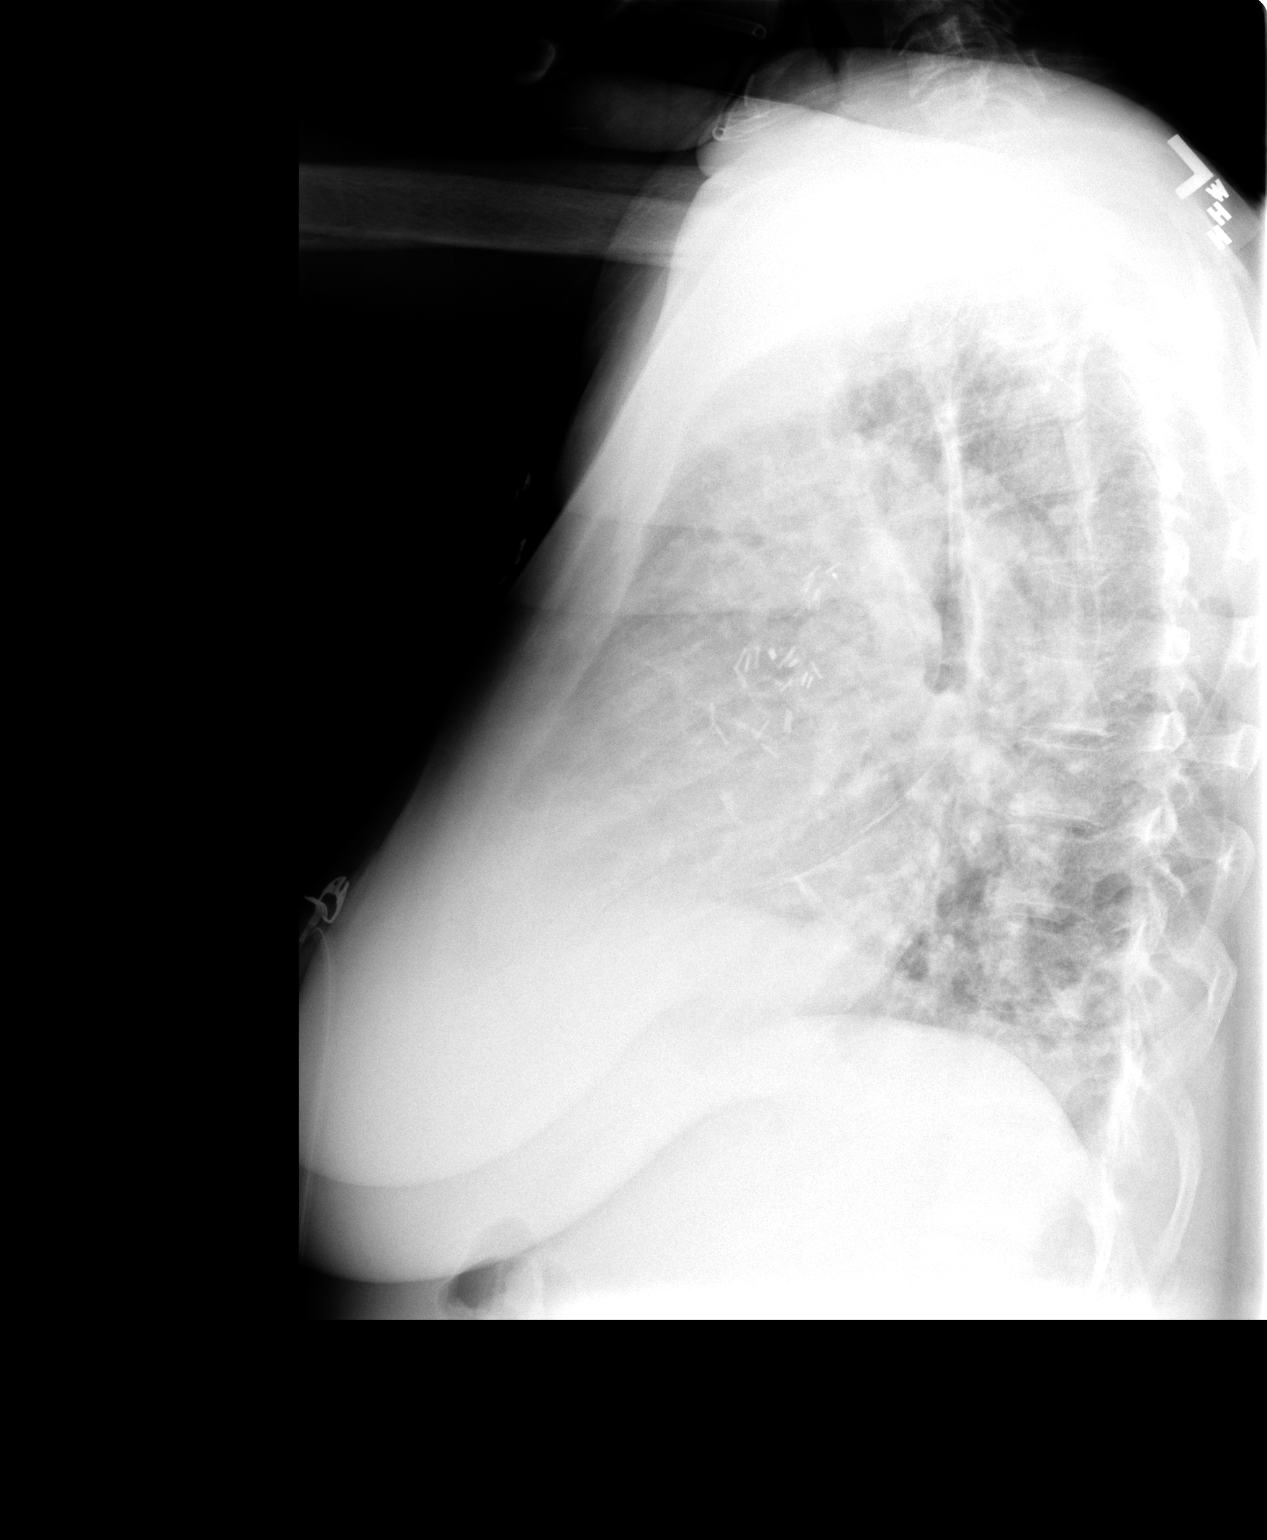

[2 of 2 positions shown; findings below may reference images not displayed]

FINDINGS: Moderate cardiomegaly.  Enlargement bulk pulmonary
arteries consistent with pulmonary hypertension.  Right axillary
dissection clips are present. Monitoring leads are projected over
the chest.

The airspace opacity present over the right hemidiaphragm is
essentially unchanged compared to prior exam, likely representing
infection or aspiration. No pleural effusion is identified.  Mild
pulmonary vascular congestion.
IMPRESSION: 1.  Cardiomegaly, pulmonary hypertension and pulmonary vascular
congestion.
2.  Unchanged airspace disease over the right hemidiaphragm
localizing to the right lower lobe. With recent imaging
demonstrating right lower lobe pneumonia, this probably represents
residual infectious/inflammatory changes.  Aspiration should be
considered.

## 2009-02-06 ENCOUNTER — Telehealth (INDEPENDENT_AMBULATORY_CARE_PROVIDER_SITE_OTHER): Payer: Self-pay | Admitting: *Deleted

## 2009-02-06 ENCOUNTER — Ambulatory Visit: Payer: Self-pay | Admitting: Internal Medicine

## 2009-02-06 LAB — CONVERTED CEMR LAB
ALT: 17 units/L (ref 0–35)
AST: 32 units/L (ref 0–37)
Albumin: 2.7 g/dL — ABNORMAL LOW (ref 3.5–5.2)
Basophils Absolute: 0 10*3/uL (ref 0.0–0.1)
CO2: 27 meq/L (ref 19–32)
Calcium: 7.9 mg/dL — ABNORMAL LOW (ref 8.4–10.5)
Chloride: 104 meq/L (ref 96–112)
Creatinine, Ser: 7.25 mg/dL — ABNORMAL HIGH (ref 0.40–1.20)
Eosinophils Relative: 1 % (ref 0–5)
HCT: 30.2 % — ABNORMAL LOW (ref 36.0–46.0)
Lymphocytes Relative: 25 % (ref 12–46)
Neutrophils Relative %: 61 % (ref 43–77)
Platelets: 87 10*3/uL — ABNORMAL LOW (ref 150–400)
Potassium: 4.2 meq/L (ref 3.5–5.3)
RDW: 16.2 % — ABNORMAL HIGH (ref 11.5–15.5)
Sodium: 138 meq/L (ref 135–145)
Total Protein: 9.6 g/dL — ABNORMAL HIGH (ref 6.0–8.3)

## 2009-04-10 ENCOUNTER — Ambulatory Visit: Payer: Self-pay | Admitting: Internal Medicine

## 2009-04-11 ENCOUNTER — Encounter: Payer: Self-pay | Admitting: Internal Medicine

## 2009-04-12 ENCOUNTER — Encounter: Payer: Self-pay | Admitting: Internal Medicine

## 2009-04-12 ENCOUNTER — Encounter (INDEPENDENT_AMBULATORY_CARE_PROVIDER_SITE_OTHER): Payer: Self-pay

## 2009-04-12 DIAGNOSIS — R197 Diarrhea, unspecified: Secondary | ICD-10-CM | POA: Insufficient documentation

## 2009-04-19 ENCOUNTER — Encounter: Payer: Self-pay | Admitting: Internal Medicine

## 2009-06-22 ENCOUNTER — Inpatient Hospital Stay (HOSPITAL_COMMUNITY): Admission: EM | Admit: 2009-06-22 | Discharge: 2009-06-27 | Payer: Self-pay | Admitting: Emergency Medicine

## 2009-06-22 IMAGING — CR DG ABDOMEN 2V
2 series · 2 of 2 positions shown · non-contrast
Comparison: [DATE]

CLINICAL DATA: diarrhea

ABDOMEN - 2 VIEW

[view not recorded (1 of 2)]
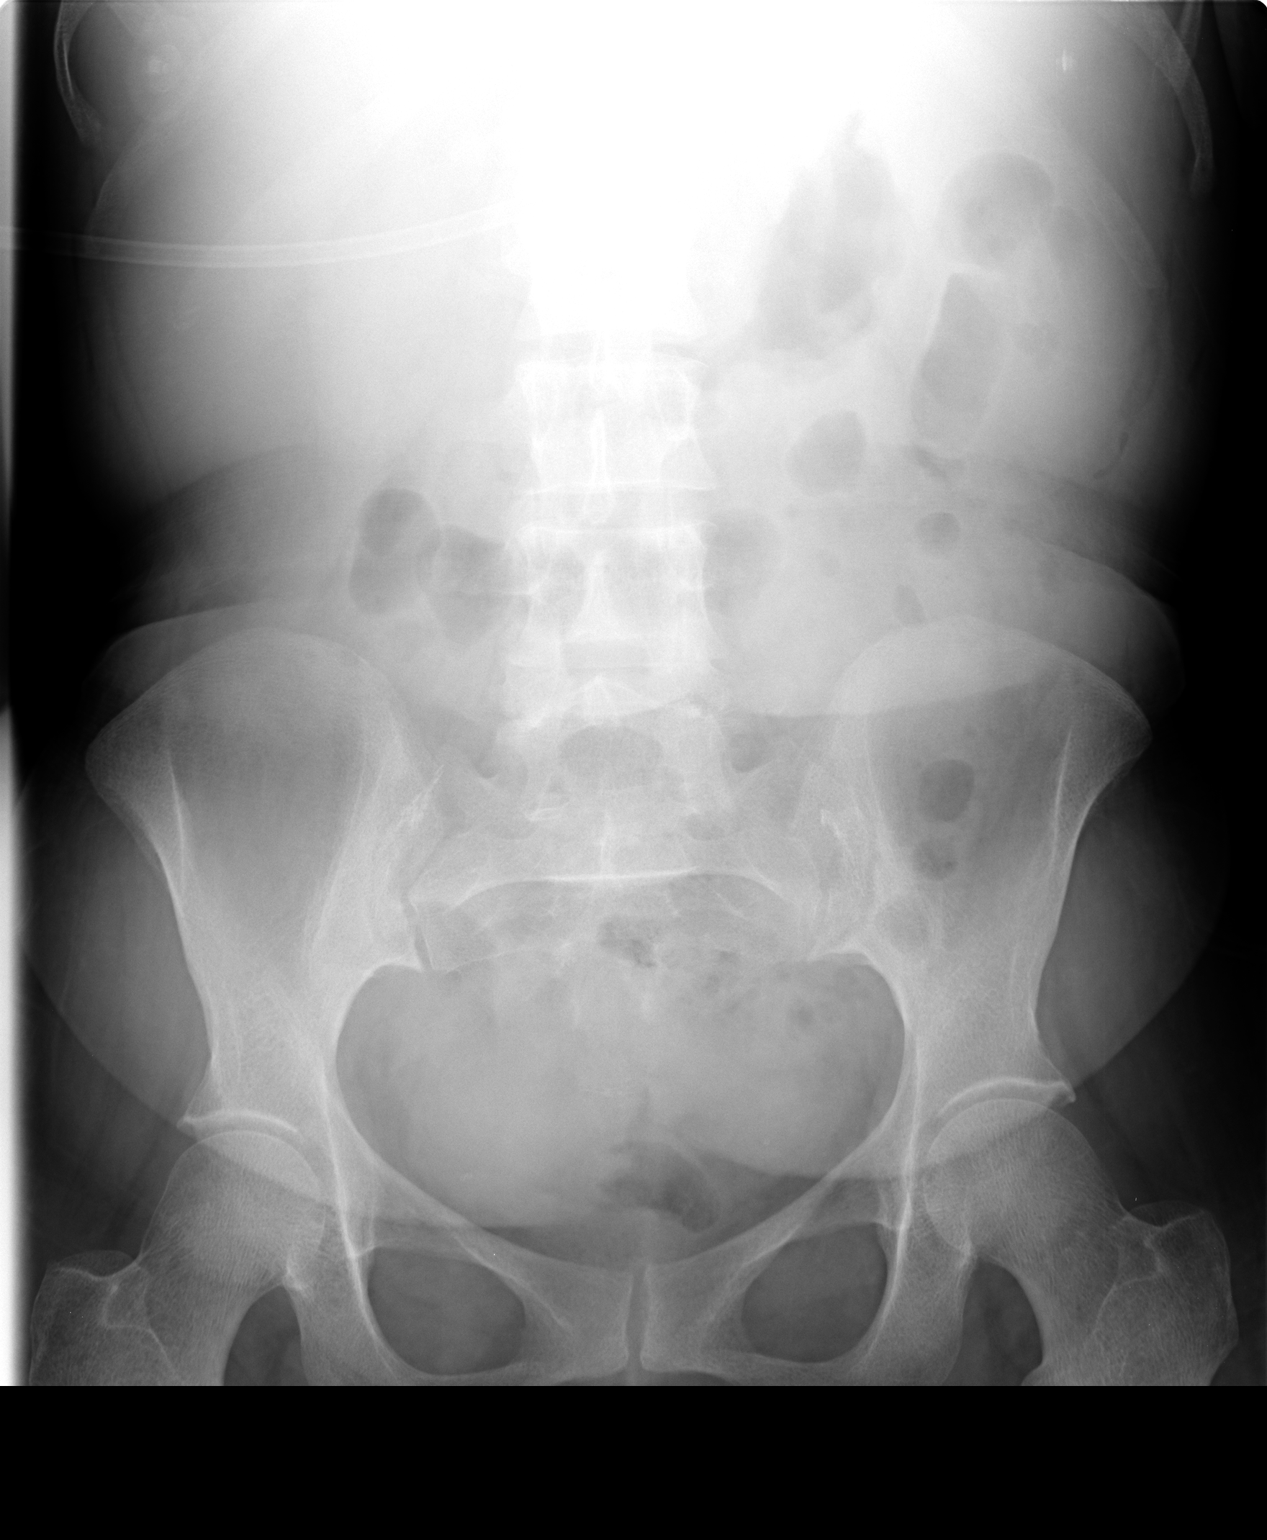

[view not recorded (2 of 2)]
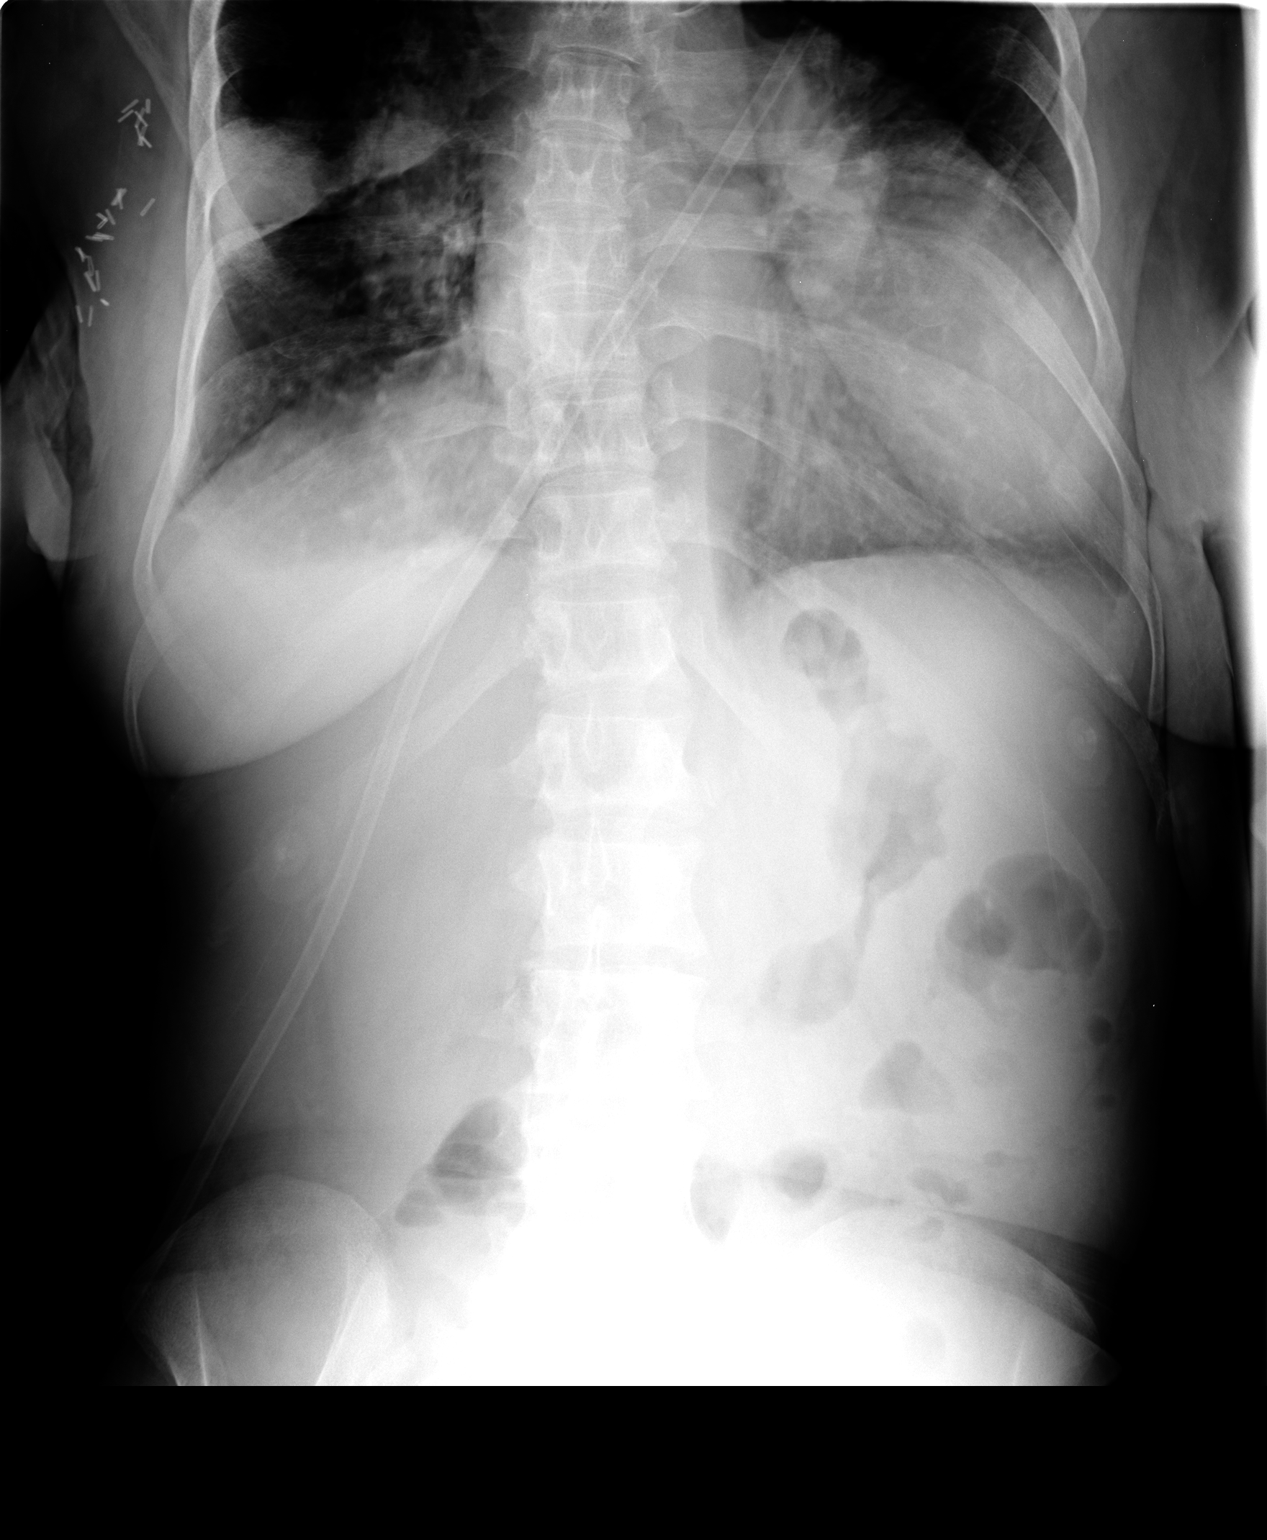

[2 of 2 positions shown; findings below may reference images not displayed]

FINDINGS: The bowel gas pattern appears nonobstructed.

There are no dilated loops of small bowel or air-fluid levels.

Gas is identified within the colon up to the rectum.
IMPRESSION: 1.  Nonobstructive bowel gas pattern.

## 2009-06-22 IMAGING — CR DG CHEST 2V
2 series · 2 of 2 positions shown · non-contrast
Comparison: [DATE]

CLINICAL DATA: Vomiting.  Shortness of breath.

CHEST - 2 VIEW

[view not recorded (1 of 2)]
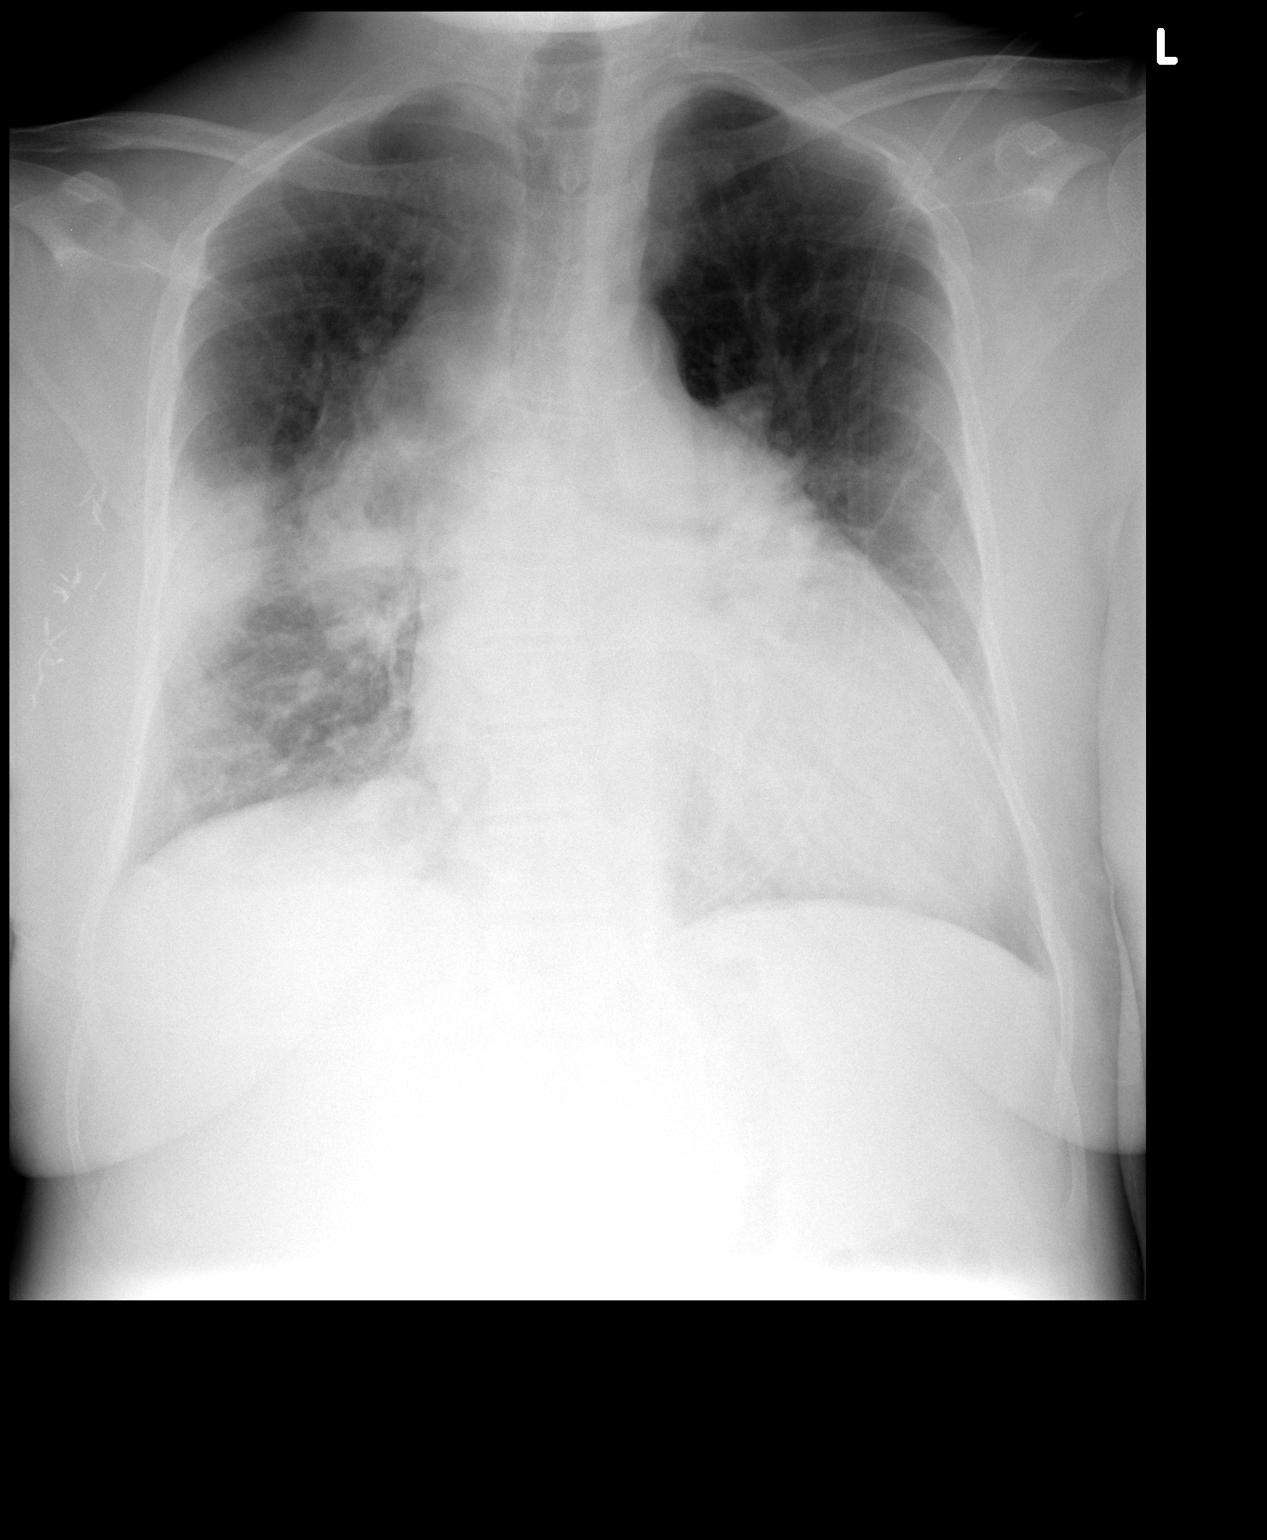

[view not recorded (2 of 2)]
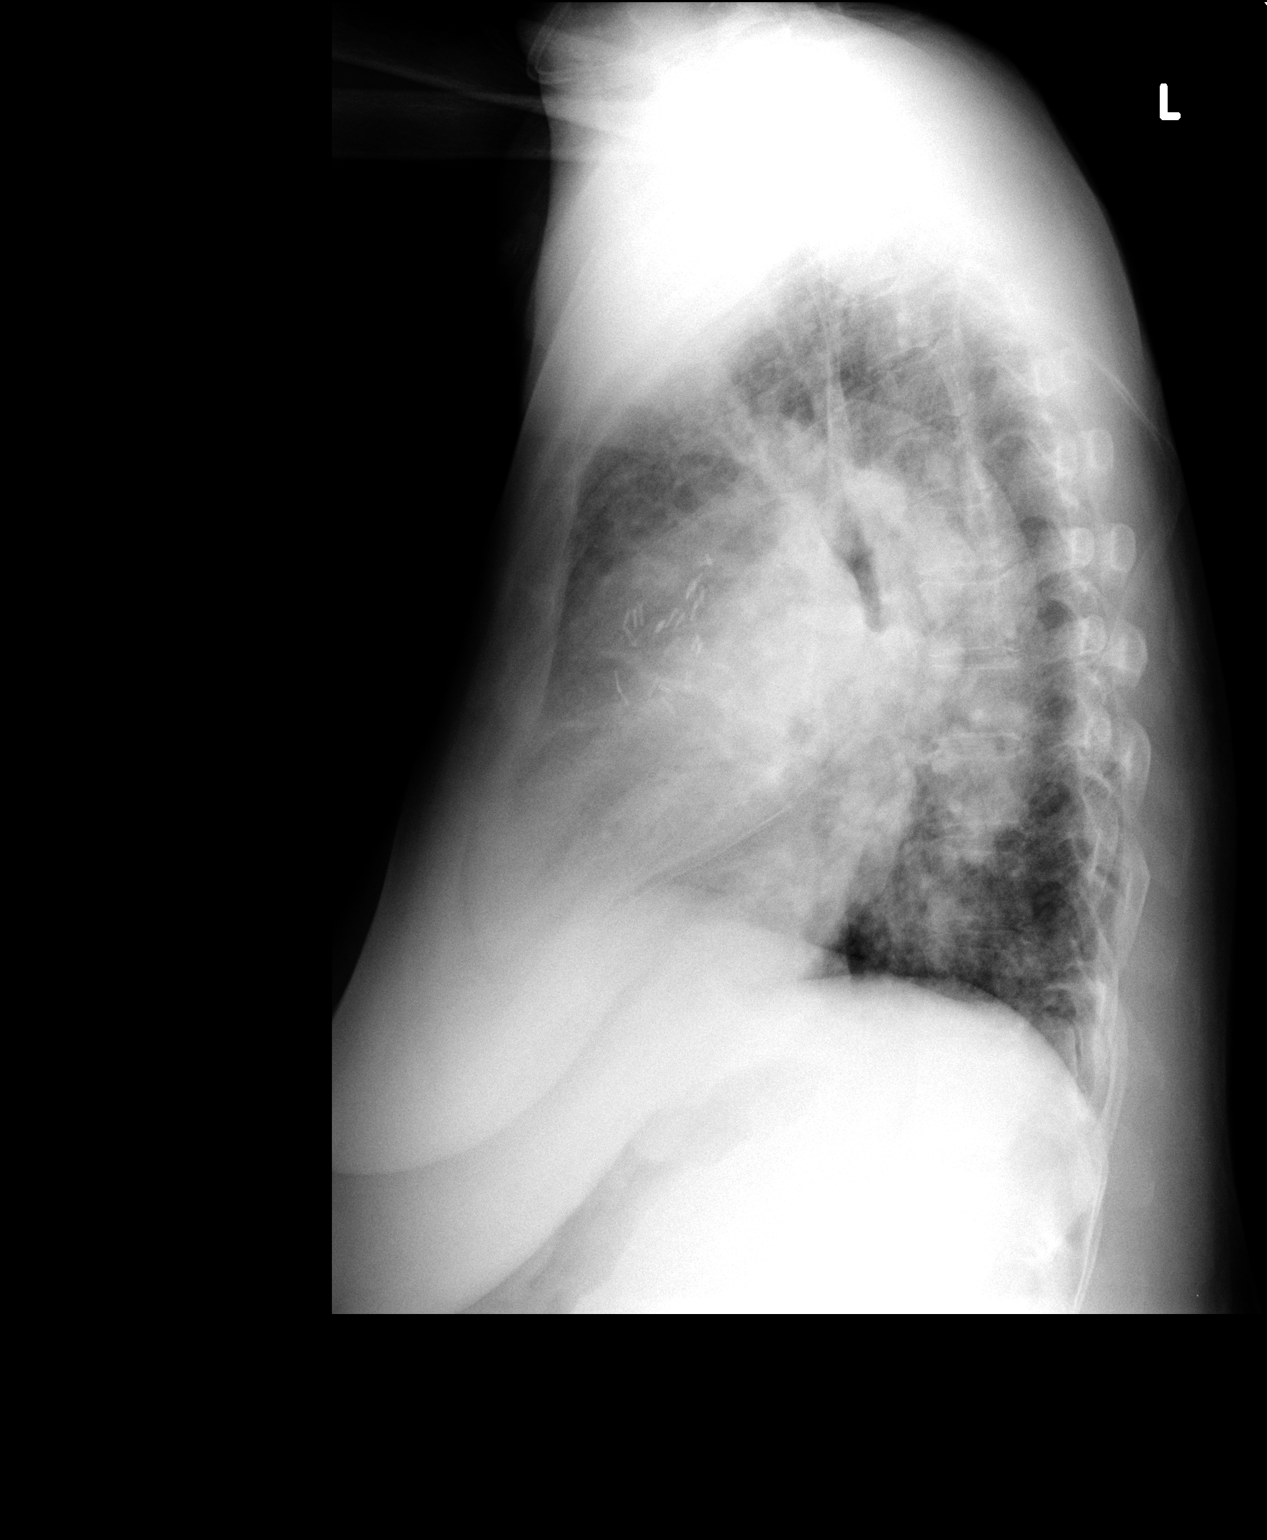

[2 of 2 positions shown; findings below may reference images not displayed]

FINDINGS: Heart size appears enlarged.

No pleural effusion identified.

Right upper lobe airspace consolidation is identified.

Left lung is clear.
IMPRESSION: 1.  Cardiac enlargement without evidence for heart failure.
2.  Right upper lobe airspace consolidation.  In the acute setting
this likely represents pneumonia.  Follow-up imaging advised to
ensure resolution.

## 2010-01-12 ENCOUNTER — Inpatient Hospital Stay (HOSPITAL_COMMUNITY): Admission: EM | Admit: 2010-01-12 | Discharge: 2010-01-16 | Payer: Self-pay | Admitting: Emergency Medicine

## 2010-01-12 IMAGING — CR DG CHEST 1V PORT
1 series · 1 of 1 positions shown · non-contrast
Comparison: Chest x-ray of [DATE] and CT chest of [DATE]

CLINICAL DATA: Fever, cough, congestion

PORTABLE CHEST - 1 VIEW

[view not recorded]
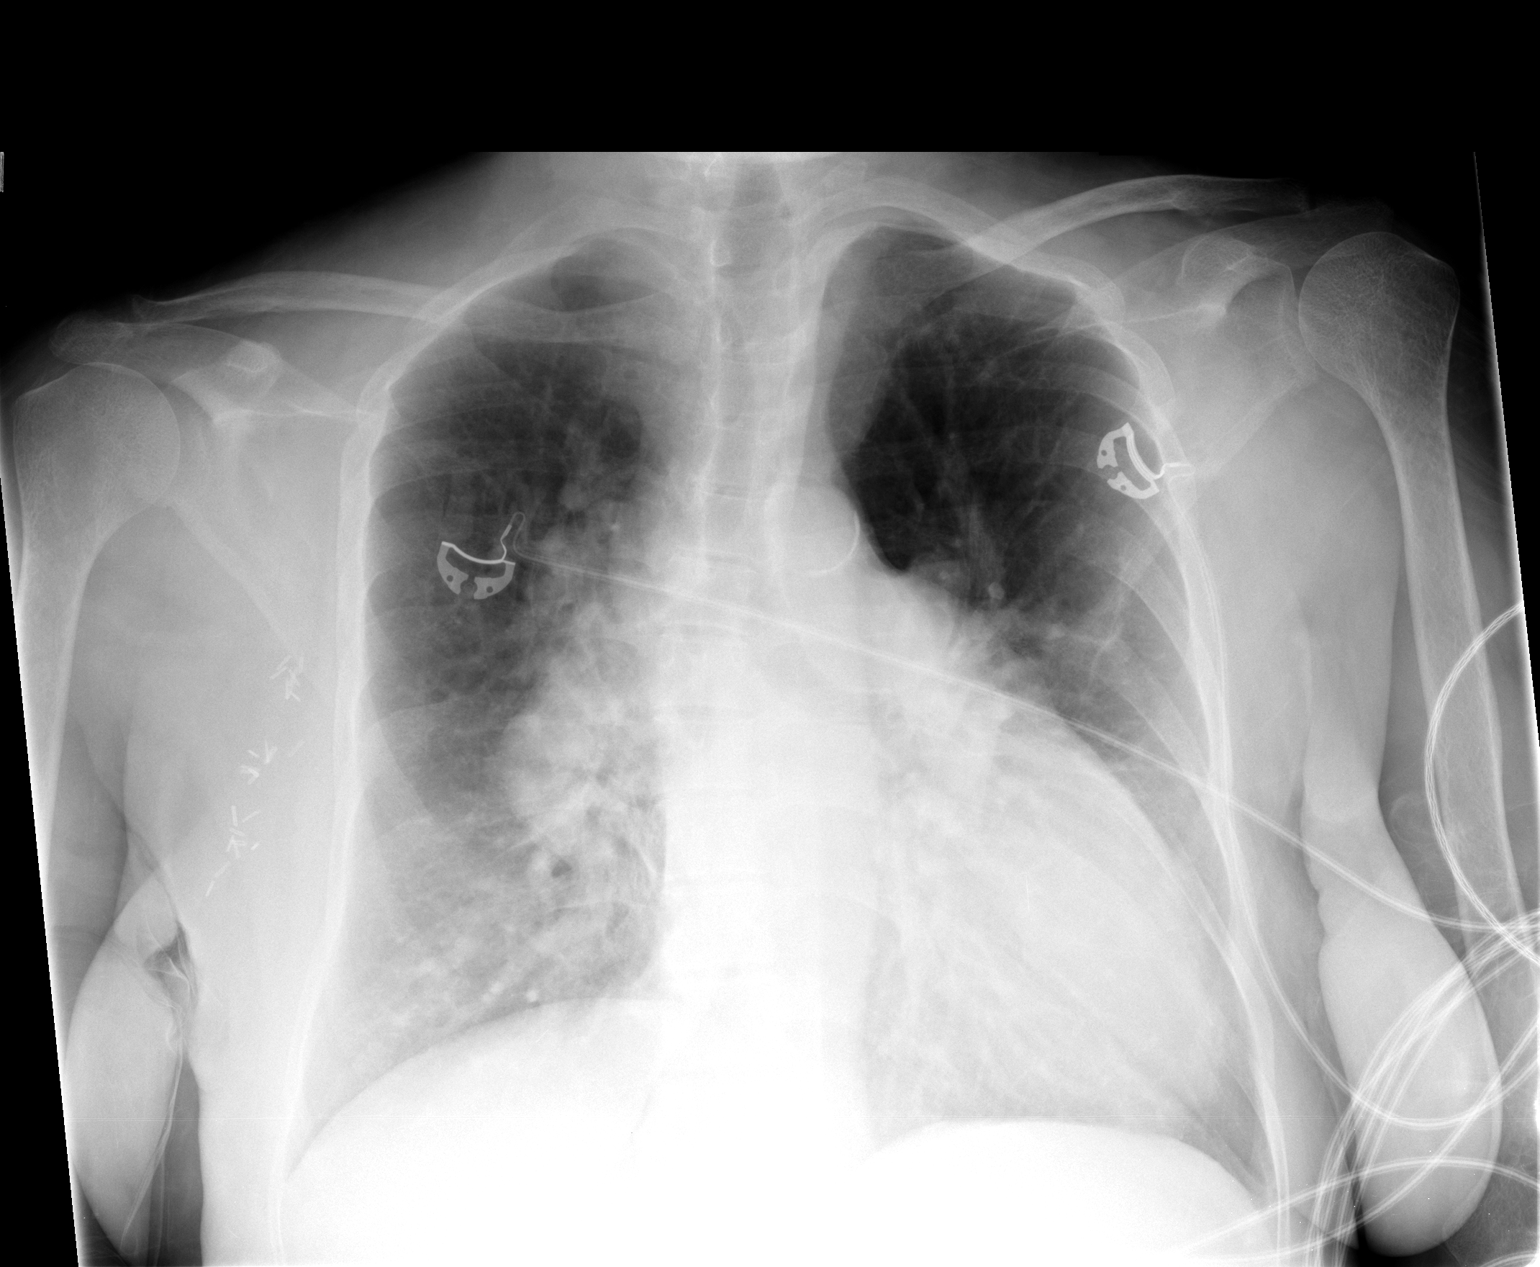

[1 of 1 positions shown; findings below may reference images not displayed]

FINDINGS: Nodular and prominent hila appear to be due to dilated
pulmonary artery segments when compared to the prior CT consistent
with pulmonary arterial hypertension.  No focal infiltrate or
effusion is seen.  Cardiomegaly is stable.  Surgical clips overlie
the right axilla.
IMPRESSION: 1.  No change in prominent pulmonary artery segments consistent
with pulmonary arterial hypertension.  No active lung disease.
2.  Stable cardiomegaly.

## 2010-01-14 ENCOUNTER — Encounter: Admission: RE | Admit: 2010-01-14 | Discharge: 2010-01-14 | Payer: Self-pay | Admitting: Internal Medicine

## 2010-01-14 IMAGING — MG MM DIGITAL DIAGNOSTIC BILAT CAD
5 series · 5 of 5 positions shown · non-contrast
Comparison: [DATE]

CLINICAL DATA: HIV positive, severe right breast pain, redness,
heat

DIGITAL DIAGNOSTIC BILATERAL MAMMOGRAM WITH CAD AND  RIGHT BREAST
ULTRASOUND:

[R CC]
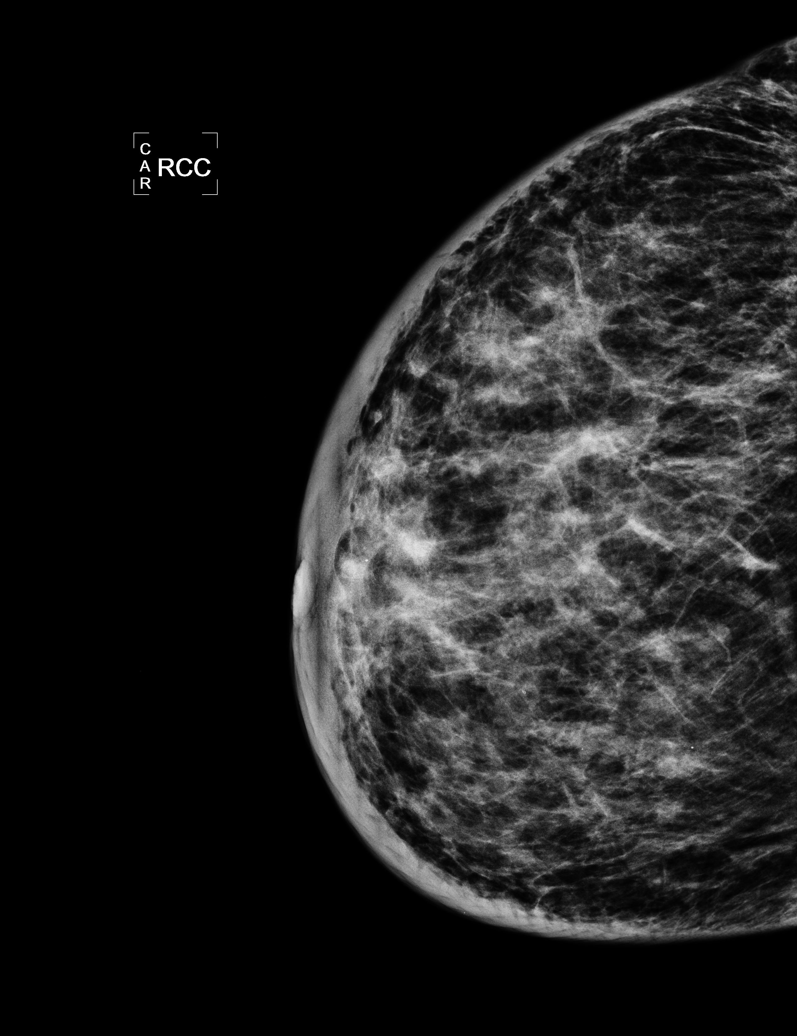

[L CC]
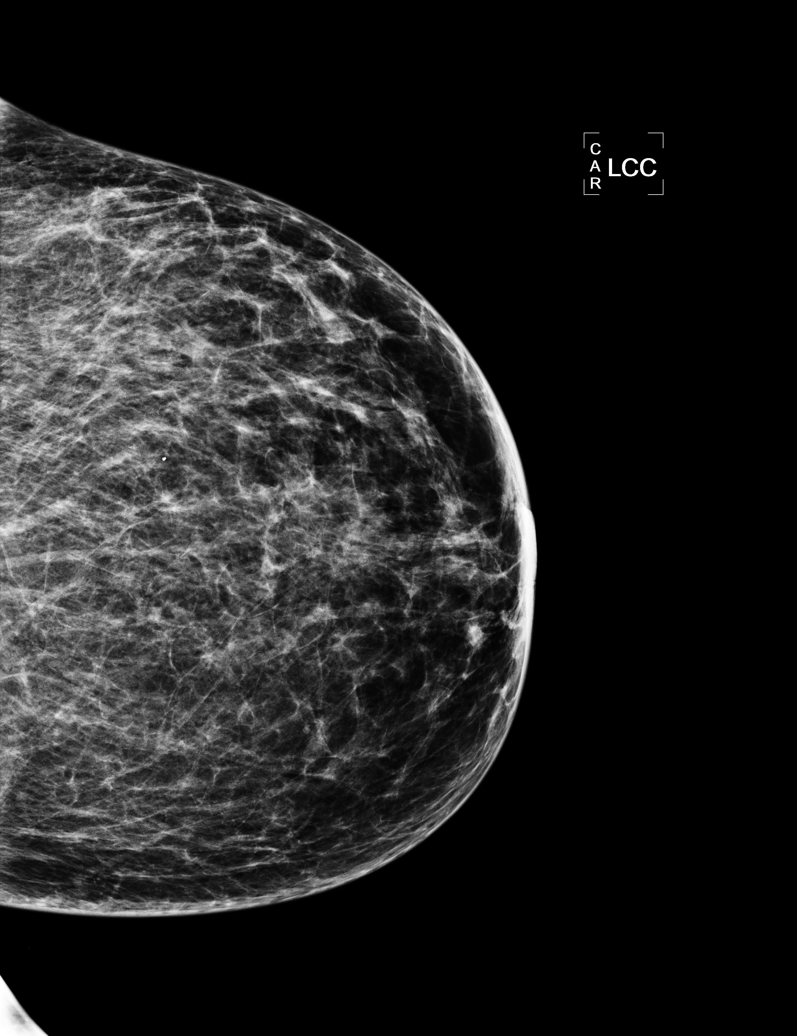

[L MLO]
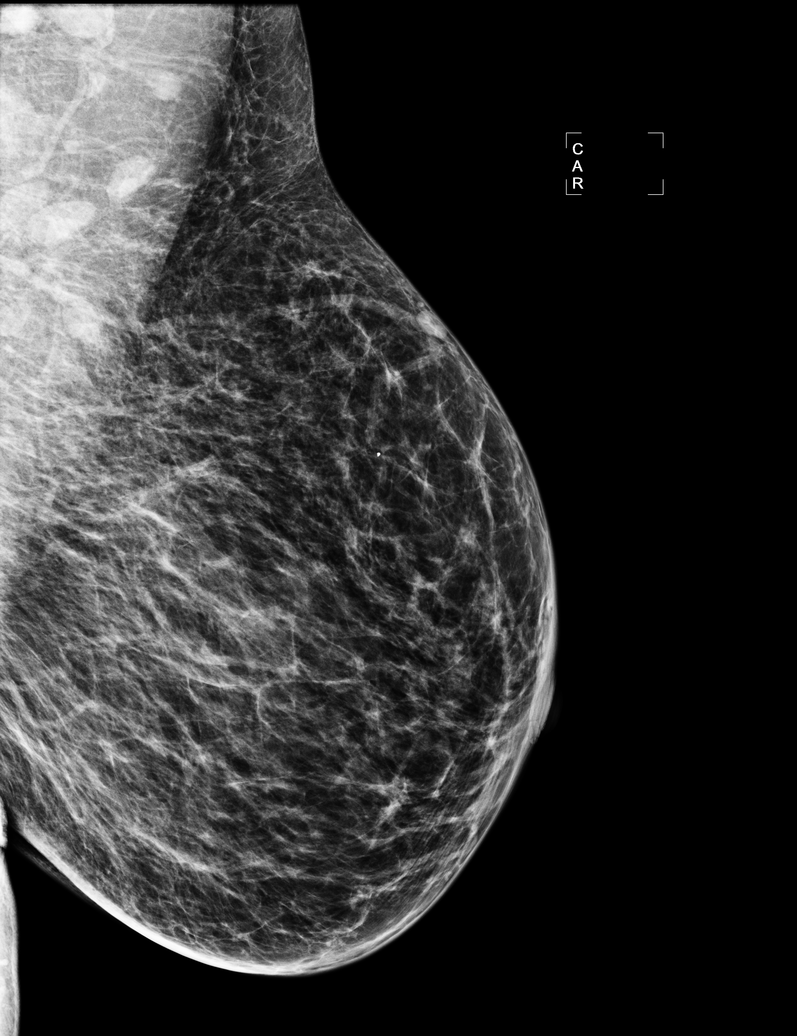

[R MLO (1 of 2)]
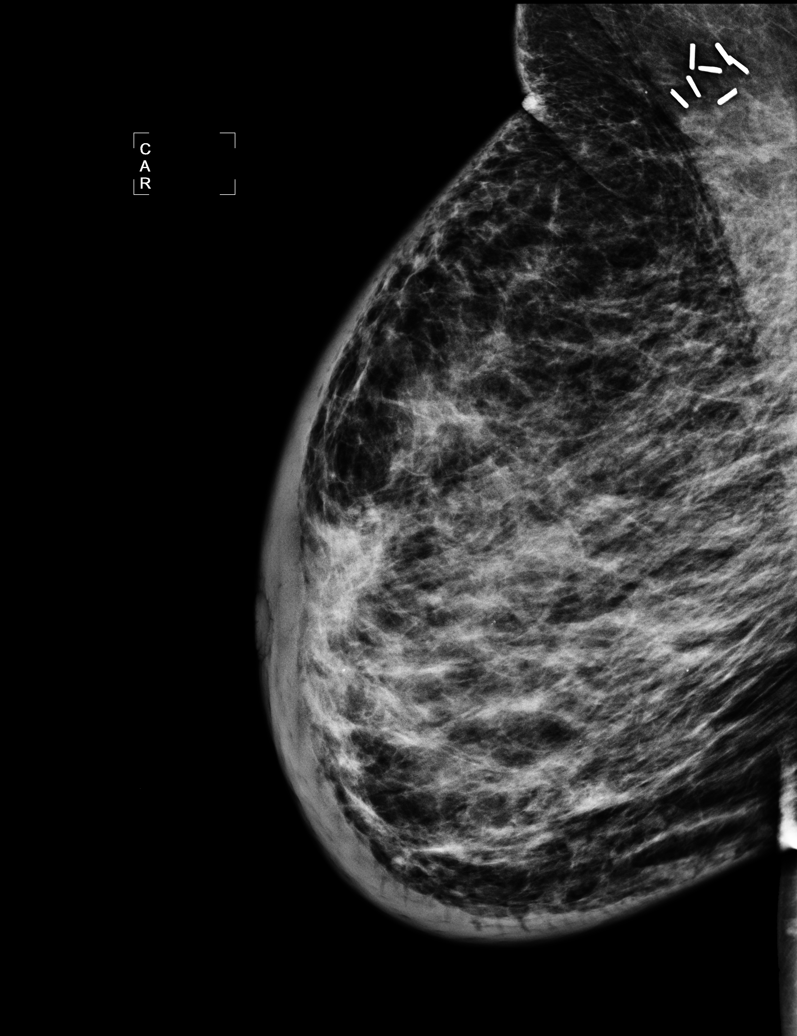

[R MLO (2 of 2)]
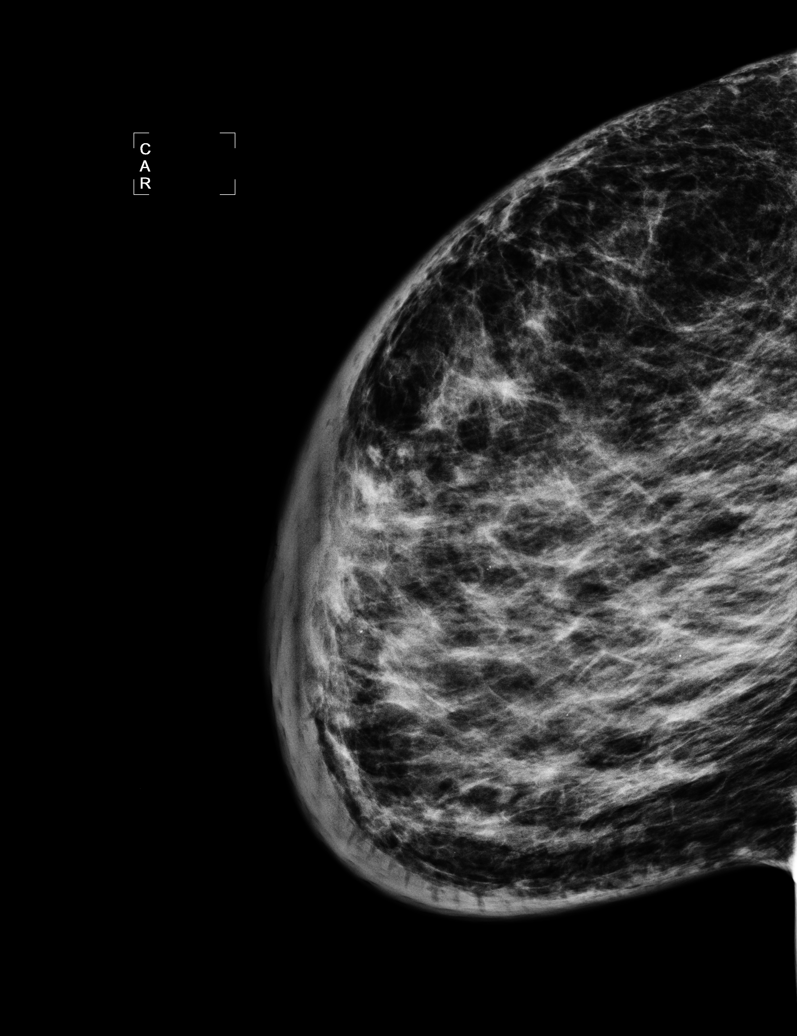

[5 of 5 positions shown; findings below may reference images not displayed]

FINDINGS: Scattered fibroglandular densities.  Numerous non
pathologic sized left axillary lymph nodes.  Left breast is
otherwise negative.  Severe diffuse skin thickening over the right
breast with severe diffuse trabecular thickening.  No focal mass or
abnormal calcification.

Mammographic images were processed with CAD.

On physical exam, there is induration and redness involving the
right breast

Ultrasound is performed, showing no evidence of mass or abscess.
Diffuse skin thickening is noted.
IMPRESSION: Findings most consistent with mastitis.  Inflammatory breast cancer
is another consideration although felt less likely.

BI-RADS CATEGORY 3:  Probably benign finding(s) - short interval
follow-up suggested.

Recommendation:

I spoke with the patient's referring physician, Dr. TJARCO, and
learned that the patient is on antibiotics.  Recommendation is for
the patient to complete her course of antibiotics and to have
follow-up mammography in 2-4 weeks to document resolution of the
findings described above.

## 2010-01-14 IMAGING — US US BREAST*R*
1 series · 5 of 5 positions shown · non-contrast
Comparison: [DATE]

CLINICAL DATA: HIV positive, severe right breast pain, redness,
heat

DIGITAL DIAGNOSTIC BILATERAL MAMMOGRAM WITH CAD AND  RIGHT BREAST
ULTRASOUND:

[Series 1: us breast*right* · 5 of 5 slices shown]
[im 1/5]
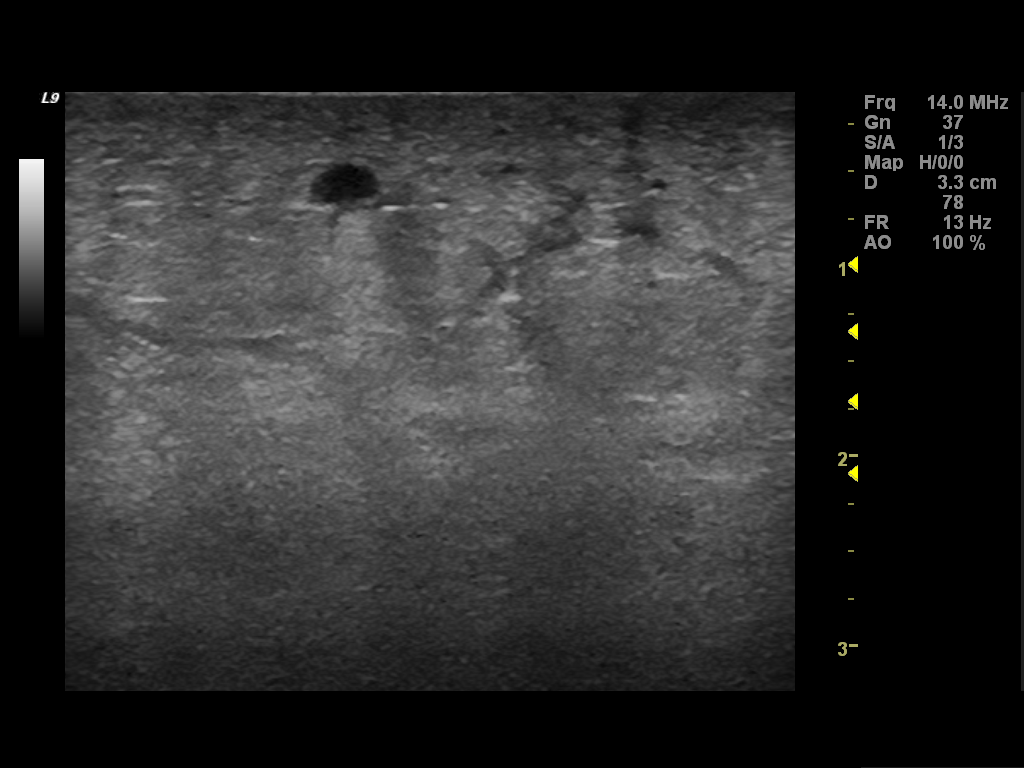
[im 2/5]
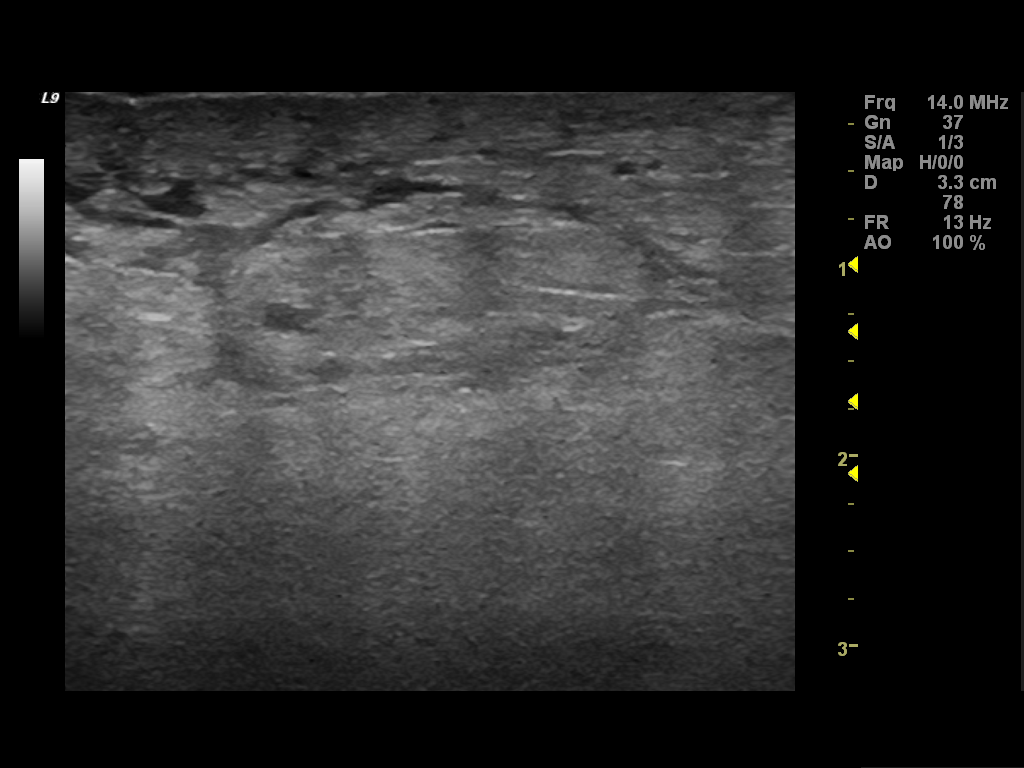
[im 3/5]
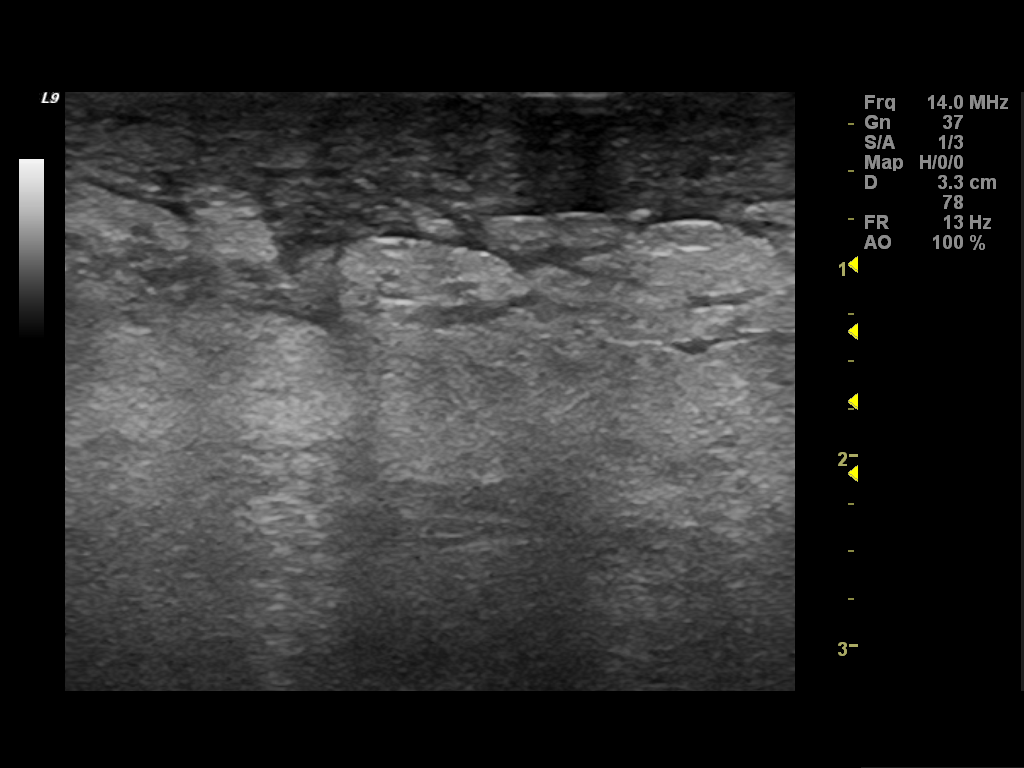
[im 4/5]
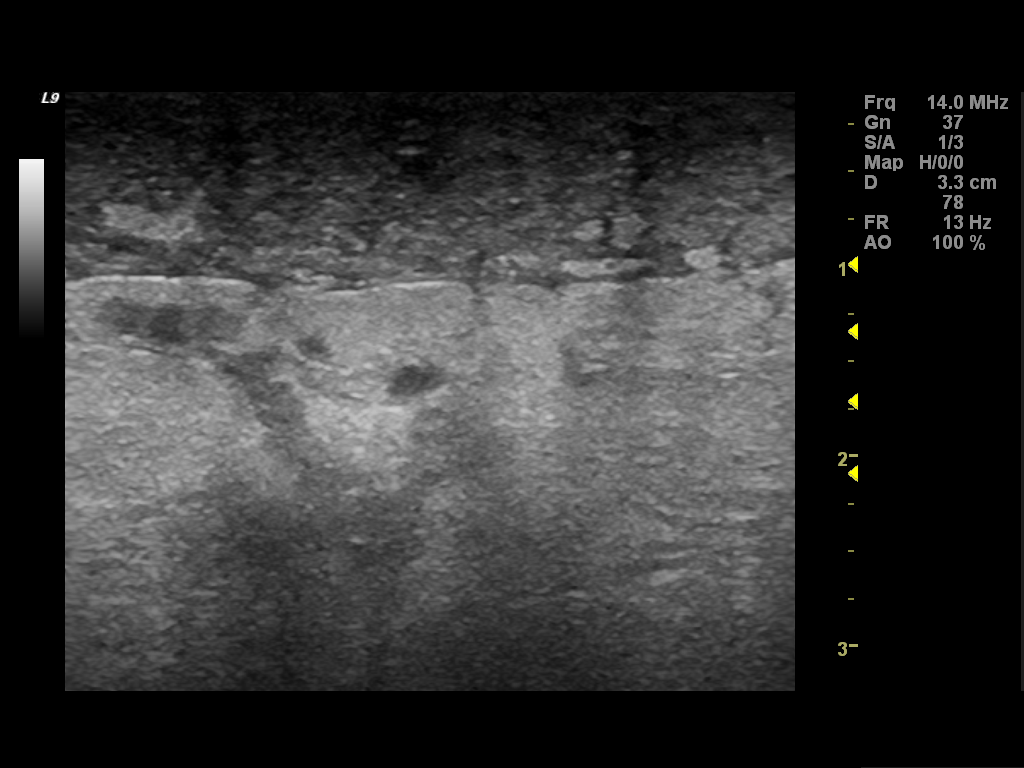
[im 5/5]
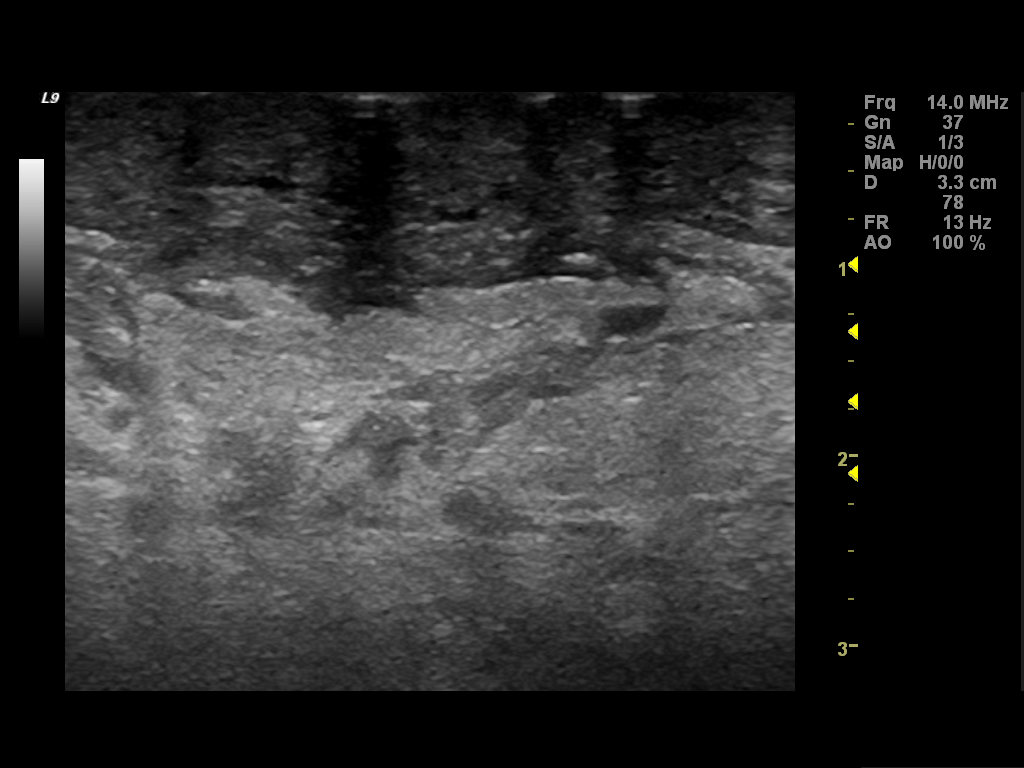

[5 of 5 positions shown; findings below may reference images not displayed]

FINDINGS: Scattered fibroglandular densities.  Numerous non
pathologic sized left axillary lymph nodes.  Left breast is
otherwise negative.  Severe diffuse skin thickening over the right
breast with severe diffuse trabecular thickening.  No focal mass or
abnormal calcification.

Mammographic images were processed with CAD.

On physical exam, there is induration and redness involving the
right breast

Ultrasound is performed, showing no evidence of mass or abscess.
Diffuse skin thickening is noted.
IMPRESSION: Findings most consistent with mastitis.  Inflammatory breast cancer
is another consideration although felt less likely.

BI-RADS CATEGORY 3:  Probably benign finding(s) - short interval
follow-up suggested.

Recommendation:

I spoke with the patient's referring physician, Dr. TJARCO, and
learned that the patient is on antibiotics.  Recommendation is for
the patient to complete her course of antibiotics and to have
follow-up mammography in 2-4 weeks to document resolution of the
findings described above.

## 2010-01-16 ENCOUNTER — Telehealth: Payer: Self-pay | Admitting: Internal Medicine

## 2010-01-22 ENCOUNTER — Telehealth: Payer: Self-pay | Admitting: Internal Medicine

## 2010-01-26 ENCOUNTER — Observation Stay (HOSPITAL_COMMUNITY)
Admission: EM | Admit: 2010-01-26 | Discharge: 2010-01-27 | Payer: Self-pay | Source: Home / Self Care | Admitting: Emergency Medicine

## 2010-01-26 IMAGING — CR DG CHEST 1V PORT
1 series · 1 of 1 positions shown · non-contrast
Comparison: [DATE] [DATE].  CT chest [DATE].

CLINICAL DATA: Weakness.  Fast heart rate.  On dialysis.

PORTABLE CHEST - 1 VIEW

[view not recorded]
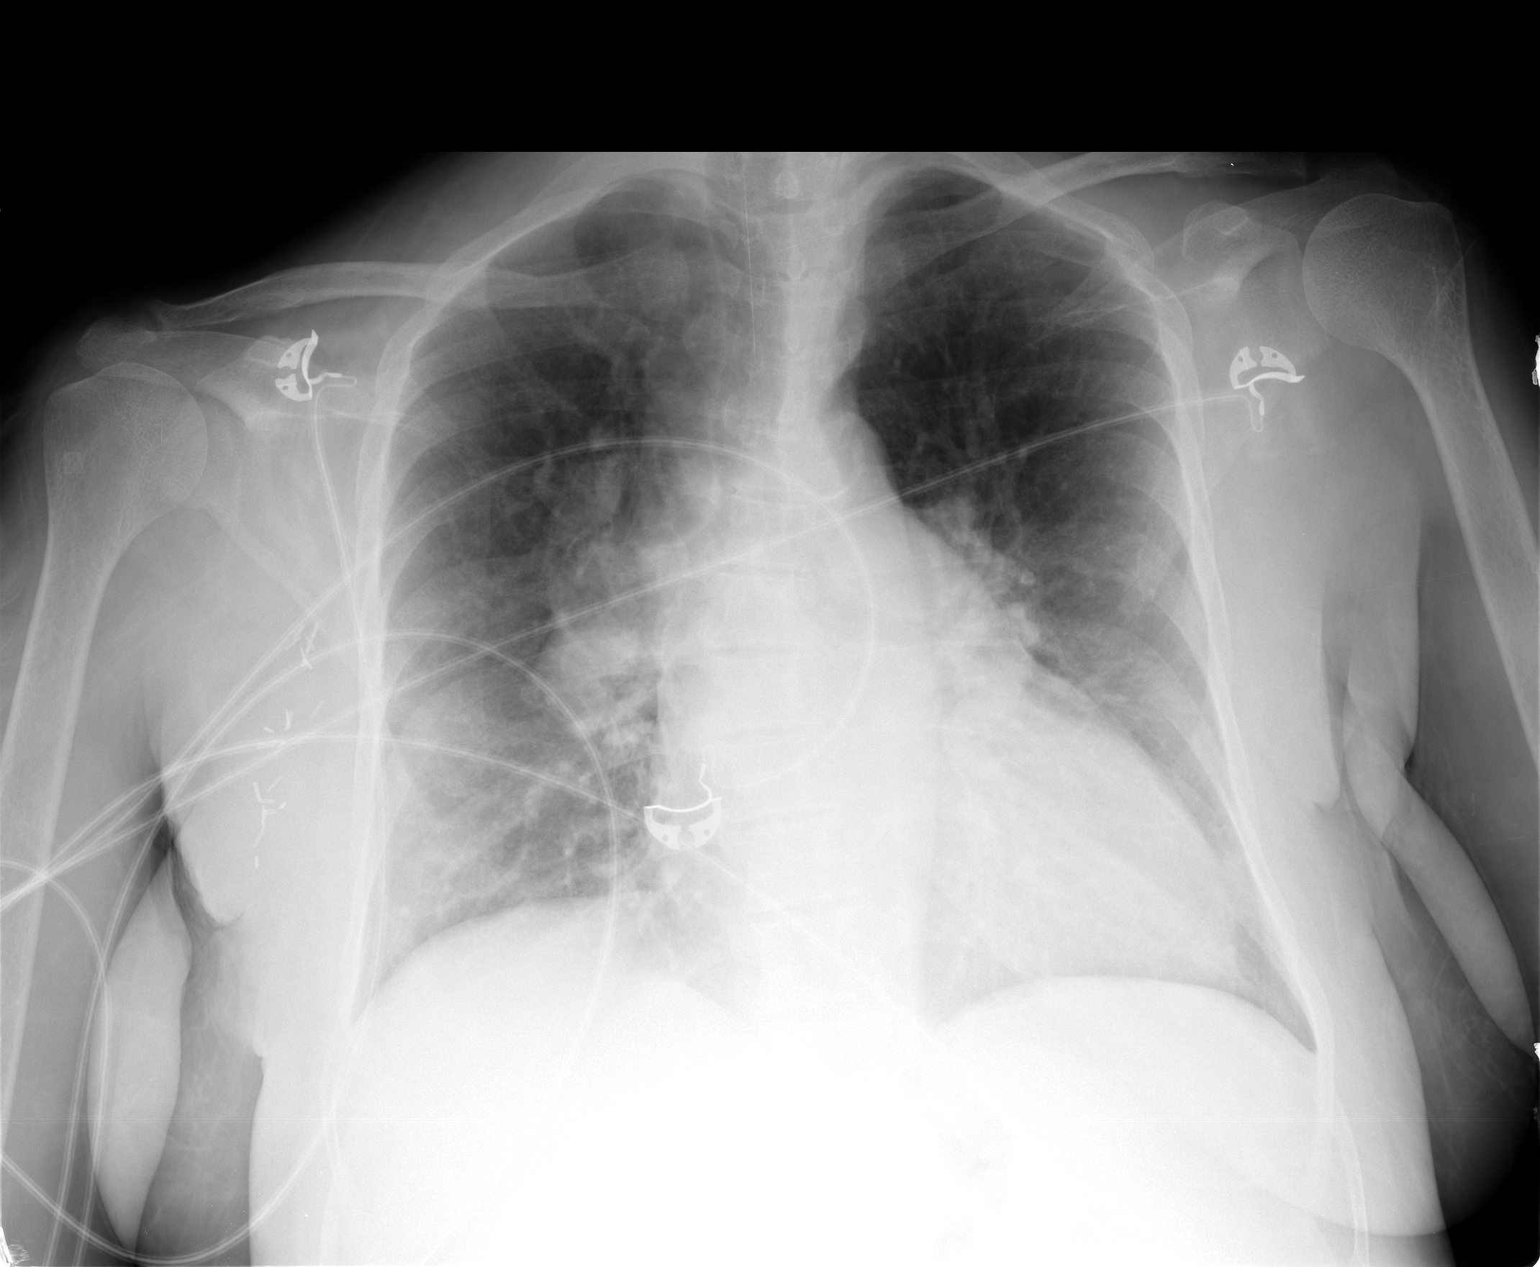

[1 of 1 positions shown; findings below may reference images not displayed]

FINDINGS: Cardiomegaly is stable.  Prominence of the central
pulmonary arteries is stable and chronic.  Lungs are clear.  No
focal airspace disease or edema is identified.  No visible pleural
effusion.  There are surgical clips in the right axilla.
IMPRESSION: 1.  Stable chest radiograph.  Chronic prominence of the pulmonary
arteries suggests pulmonary arterial hypertension. No acute
findings.
2.  Stable cardiomegaly.

## 2010-02-05 ENCOUNTER — Ambulatory Visit: Payer: Self-pay | Admitting: Internal Medicine

## 2010-02-11 ENCOUNTER — Encounter: Payer: Self-pay | Admitting: Internal Medicine

## 2010-03-10 ENCOUNTER — Ambulatory Visit
Admission: RE | Admit: 2010-03-10 | Discharge: 2010-03-10 | Payer: Self-pay | Source: Home / Self Care | Attending: Internal Medicine | Admitting: Internal Medicine

## 2010-03-23 ENCOUNTER — Encounter: Payer: Self-pay | Admitting: Internal Medicine

## 2010-04-03 NOTE — Miscellaneous (Signed)
Summary: RW Update  Clinical Lists Changes  Observations: Added new observation of YEARAIDSPOS: 2010  (04/19/2009 14:46) Added new observation of HIV STATUS: CDC-defined AIDS  (04/19/2009 14:46)

## 2010-04-03 NOTE — Letter (Signed)
Summary: NOTES FROM [facility]  NOTES FROM [facility]   Imported By: Zeb Comfort 04/11/2009 11:17:03  _____________________________________________________________________  External Attachment:    Type:   Image     Comment:   External Document

## 2010-04-03 NOTE — Miscellaneous (Signed)
Summary: FECAL OCCULT/01/30/2009  Clinical Lists Changes  L-Occult Blood, Fecal-Point of Care - STATUS: Final                                            Perform Date: 1 Dec10 13:37  Ordered By: Ronnette Hila  ,        Ordered Date: [phone] 01:35                                       Last Updated Date: 1 Dec10 14:22  Facility: [facility]                               Department: [facility]  Accession #: [id number]                  USN:       [id number]  Findings  Result Name                              Result     Abnl   Normal Range     Units      Perf. Loc.  Occult Blood, Fecal                      NEGATIVE  Additional Information  HL7 RESULT STATUS : F  External IF Update Timestamp : 2009-01-30:14:19:00.000000

## 2010-04-03 NOTE — Assessment & Plan Note (Signed)
Summary: 10month f/u/vs   Primary Provider:  Eula Fried  CC:  1 MONTH FOLLOW UP.  History of Present Illness: patient here for lab results which were drawn over at the dialysis center and sent with her from the nursing home.  She complains of a sore throat and some chest congestion for the last several days.  She denies fever or chills.  She has been given an antibiotic at the nursing home by the doctor there.she states the antibiotic is given her some cramps and she has had a decreased appetite.  Preventive Screening-Counseling & Management  Alcohol-Tobacco     Alcohol drinks/day: 0     Smoking Status: quit     Year Quit: 2000     Pack years: 15 years, from 6th grade on     Passive Smoke Exposure: no   Current Allergies (reviewed today): ! 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  The patient denies fever, weight loss, dyspnea on exertion, prolonged cough, and hemoptysis.    Vital Signs:  Patient profile:   60 year old female Menstrual status:  postmenopausal Height:      60 inches (152.40 cm) Weight:      125.12 pounds (56.87 kg) BMI:     24.52 Temp:     98.3 degrees F (36.83 degrees C) oral BP sitting:   161 / 100  (right arm)  Vitals Entered By: Rocky Morel) (March 10, 2010 2:31 PM) CC: 1 MONTH FOLLOW UP Pain Assessment Patient in pain? yes     Location: THROAT Type: SORE Onset of pain  THROAT HAS BEEN SORE SINCE  SATURDAY Nutritional Status BMI of 19 -24 = normal Nutritional Status Detail APPETITE IS SO-SO PER PATIENT  Does patient need assistance? Functional Status Self care Ambulation Wheelchair Comments PATIENT LIVES IN AN ASSISTED FACILITY. BLOOD PRESSURE OBTAINED BY USING RIGHT LEG.   Physical Exam  General:  alert, well-developed, well-nourished, and well-hydrated.   Head:  normocephalic and atraumatic.   Mouth:  pharynx pink and moist, no erythema, and no exudates.   Lungs:  normal breath sounds.     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  Pt instructed to continue the current antiretroviral regimen.  Pt encouraged to take medication regularly and not miss doeses.  Pt will f/u in 3 months.   Orders: Est. 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 Take 1 tablet by mouth two times a day  Patient Instructions: 1)  Please schedule a follow-up appointment in 3 months.

## 2010-04-03 NOTE — Progress Notes (Signed)
Summary: Pt will need VL   Phone Note Other Incoming   Caller: Dr Ronnald Ramp Summary of Call: The viral load was not completed due to specimen was not adequate.  Pt will need viral load at OV.  Orland Mustard RN  January 22, 2010 12:12 PM

## 2010-04-03 NOTE — Miscellaneous (Signed)
Summary: FECAL LACTOFERRIN/ 01/27/2009  Clinical Lists Changes  Fecal-Lactoferrin - STATUS: Final  SEE NOTE.                                 Perform Date: 28Nov10 13:00  Ordered By: Marlan Palau  ,         Ordered Date: [id number] 11:20  Facility: [facility]                               Department: [facility]  Service Report Text     SPECIMEN OBTAINED:            01/27/2009 13:00  SPECIMEN DESCRIPTION:         STOOL  SPECIAL REQUESTS:             NONE  FECAL LACTOFERRIN:            POSITIVE  REPORT STATUS:                FINAL                                01/29/2009  Additional Information  HL7 RESULT STATUS : F  External IF Update Timestamp : 2009-01-29:02:27:00.000000

## 2010-04-03 NOTE — Letter (Signed)
Summary: [facility]  [facility]   Imported By: Zeb Comfort 04/11/2009 11:20:19  _____________________________________________________________________  External Attachment:    Type:   Image     Comment:   External Document

## 2010-04-03 NOTE — Letter (Signed)
Summary: Consult/DC by NUR  Consult/DC by NUR   Imported By: Craige Cotta 04/12/2009 07:24:40  _____________________________________________________________________  External Attachment:    Type:   Image     Comment:   External Document

## 2010-04-03 NOTE — Miscellaneous (Signed)
Summary: OVA AND PARASITES/01/27/2009  Clinical Lists Changes Ova and Parasite Exam - STATUS: Final  SEE NOTE.                                 Perform Date: 28Nov10 13:00  Ordered By: Marlan Palau  ,         Ordered Date: [id number] 11:20  Facility: [facility]                               Department: [facility]  Service Report Text     SPECIMEN OBTAINED:            01/27/2009 13:00  SPECIMEN DESCRIPTION:         STOOL  SPECIAL REQUESTS:             NONE  OVA AND PARASITES:            NO OVA OR PARASITES SEEN                                MODERATE                                YEAST  REPORT STATUS:                FINAL                                01/28/2009  Additional Information  HL7 RESULT STATUS : F  External IF Update Timestamp : 2009-01-28:16:10:00.000000

## 2010-04-03 NOTE — Miscellaneous (Signed)
Summary: OVA AND PARASITES/01/30/2009  Clinical Lists Changes  Ova and Parasite Exam - STATUS: Final  SEE NOTE.                                 Perform Date: 1 Dec10 13:37  Ordered By: Ronnette Hila  ,        Ordered Date: [phone] 01:35  Facility: [facility]                               Department: [facility]  Service Report Text     SPECIMEN OBTAINED:            01/30/2009 13:37  SPECIMEN DESCRIPTION:         STOOL  SPECIAL REQUESTS:             NONE  OVA AND PARASITES:            NO OVA OR PARASITES SEEN  REPORT STATUS:                FINAL                                01/31/2009  Additional Information  HL7 RESULT STATUS : F  External IF Update Timestamp : 2009-01-31:15:33:00.000000

## 2010-04-03 NOTE — Progress Notes (Signed)
Summary: hospital followup  Phone Note Other Incoming   Caller: Call from [place] Summary of Call: Nurse call from [facility] pt. was admitted for cellulitis of right breast.  Pt. not seen by Dr. Tomma Lightning in over a year, pts. labs to be drawn in hospital and appt. scheduled in 2 weeks with Dr. Initial call taken by: Myrtis Hopping CMA Deborra Medina),  January 16, 2010 9:13 AM

## 2010-04-03 NOTE — Assessment & Plan Note (Signed)
Summary: per Lyman Bishop from  [facility] [mkj]   Primary Zipporah Finamore:  Eula Fried  CC:  hospital followup and needs viral load.  History of Present Illness: patient here for follow-up.  She was hospitalized for mastitis as well as pancreatitis.  She is currently staying in an assisted living facility.  She complains of fatigue.  She has been taking her HIV medications regularly as they give him to her at the assisted living facility.  She did not have a viral load done while she was in the hospital so we will need to have that drawn.  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  For now she should continue taking her current regimen.  Preventive Screening-Counseling & Management  Alcohol-Tobacco     Alcohol drinks/day: 0     Smoking Status: quit     Year Quit: 2000     Pack years: 15 years, from 6th grade on     Passive Smoke Exposure: no  Caffeine-Diet-Exercise     Caffeine use/day: occassionally     Does Patient Exercise: no     Type of exercise: walking     Times/week: 1  Hep-HIV-STD-Contraception     HIV Risk: no  Safety-Violence-Falls     Seat Belt Use: yes      Drug Use:  no.     Updated Prior Medication List: VICODIN 5-500 MG TABS (HYDROCODONE-ACETAMINOPHEN) Take 1 tablet by mouth every 6 hours as needed ISENTRESS 400 MG TABS (RALTEGRAVIR POTASSIUM) 1 tab by mouth two times a day REVATIO 20 MG TABS (SILDENAFIL CITRATE) 1 tablet by mouth three times daily RENVELA 800 MG TABS (SEVELAMER CARBONATE) two tablets by mouth three times a day RENA-VITE  TABS (B COMPLEX-C-FOLIC ACID) 1 tablet by mouth at bedtime ZOFRAN 4 MG TABS (ONDANSETRON HCL) 1/2 tablet as needed * ZEMPLAR  INJECTION 2.5 micrograms IV 3 times a week at dialysis * EPIVIR 100 MG TABS (LAMIVUDINE) Take one-half tablet daily CARDIZEM 30 MG TABS (DILTIAZEM HCL) Take 1 tablet by mouth two times a day * MYLANTA CHEWS As needed * TYLENOL 325  MG Take two tablets every 4 hours as needed for pain VENTOLIN HFA 108 (90 BASE) MCG/ACT AERS (ALBUTEROL SULFATE) As needed ALPRAZOLAM 0.5 MG TABS (ALPRAZOLAM) One tablet twice daily as needed EPOGEN 10000 UNIT/ML SOLN (EPOETIN ALFA) Administer 0.14ml Subcutaneously  to be given @ dialysis FLORA-Q  CAPS (PROBIOTIC PRODUCT) One tablet daily QUESTRAN 4 GM PACK (CHOLESTYRAMINE) 1/2 scoop (2 gm) in liquid twice daily RETROVIR 100 MG CAPS (ZIDOVUDINE) Take 1 tablet by mouth three times a day FOSRENOL 500 MG CHEW (LANTHANUM CARBONATE) Chew one tablet three times daily with meals FOSRENOL 500 MG CHEW (LANTHANUM CARBONATE) 1/2 tablet by mouth three times daily with snacks * PROSTAT 64 30 cc by mouth three times daily with meals OXYCODONE HCL 5 MG TABS (OXYCODONE HCL) one tablet twice a day PRILOSEC 40 MG CPDR (OMEPRAZOLE) one time a day ZOFRAN 40 MG/20ML SOLN (ONDANSETRON HCL) every 4 hrs. as needed for nausea FOSRENOL 500 MG CHEW (LANTHANUM CARBONATE) three times a day with meals  Current Allergies (reviewed today): ! 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    Vital Signs:  Patient profile:   60 year old female Menstrual status:  postmenopausal Height:      60 inches (152.40 cm) Weight:      125.4 pounds (57 kg) BMI:     24.58 Temp:     98.8 degrees F (37.11 degrees C) oral Pulse rate:   78 / minute BP sitting:   159 / 108  (left  arm)  Vitals Entered By: Myrtis Hopping CMA Deborra Medina) (February 05, 2010 3:05 PM) CC: hospital followup, needs viral load Is Patient Diabetic? No Pain Assessment Patient in pain? no      Nutritional Status BMI of 19 -24 = normal Nutritional Status Detail appetite "not too good"  Have you ever been in a relationship where you felt threatened, hurt or afraid?Unable to ask   Does patient need assistance? Functional Status Cook/clean, Shopping Ambulation Wheelchair Comments no missed doses of meds, pt. stays in nursing home pt. uses wheelchair   Physical Exam  General:  alert, well-developed, well-nourished, and well-hydrated.   Head:  normocephalic and atraumatic.   Mouth:  pharynx pink and moist.   Lungs:  normal breath sounds.     Impression & Recommendations:  Problem # 1:  HUMAN IMMUNODEFICIENCY VIRUS [HIV] (ICD-042) Assessment Comment Only Will obtain CD4ct and VL at Dialysis and have them fax [place] the results. She is to continue her current meds. Orders: Est. Patient Level IV [id number])  Medications Added to Medication List This Visit: 1)  Oxycodone Hcl 5 Mg Tabs (Oxycodone hcl) .... One tablet twice a day 2)  Prilosec 40 Mg Cpdr (Omeprazole) .... One time a day 3)  Zofran 40 Mg/32ml Soln (Ondansetron hcl) .... Every 4 hrs. as needed for nausea 4)  Fosrenol 500 Mg Chew (Lanthanum carbonate) .... Three times a day with meals  Patient Instructions: 1)  Please schedule a follow-up appointment in 4 weeks.   Immunization History:  Influenza Immunization History:    Influenza:  historical (12/11/2009)

## 2010-04-03 NOTE — Assessment & Plan Note (Signed)
Summary: ppt to consider TCS for chronic diarrhea.- cdg   Visit Type:  Initial Consult Primary Care Provider:  Eula Fried  Chief Complaint:  diarrhea.  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 Seen by our GI  service when she was in the hospital back in November 2010 with diarrhea. She had a rather extensive workup short of endoscopic evaluation. Her lactoferrin came back positive. She had quite a bit yeast in her stool; AFB smear came back negative and C. difficile as well; culture report did not return back to her office chart. She was discharged on a course of Diflucan. She has been residing at [facility] in [place]. She tells me the diarrheahas settled down on 2 g of Questran b.i.d. She may have days where she has 4 somewhat loose stools and other day she may only have one or 2. She is not having any abdominal pain. No nausea or vomiting. She does not feel she has lost any weight;  however,  she is confined to the wheelchair these days and has not been weighed.  I discussed our algorthym of performing a sigmoidoscopy versus a colonoscopy to further evaluate her diarrhea. As stated, she says her diarrhea is much improved and doesn't wish to undergo anymore testing. In addition, the patient denies any fever or chills. She has not had any blood per rectum. Her appetite is described as being pretty good.  Preventive Screening-Counseling & Management  Alcohol-Tobacco     Smoking Status: quit  Current Problems (verified): 1)  Hx of Alcohol Abuse  (ICD-305.00) 2)  Tobacco Use, Quit  (ICD-V15.82) 3)  Pancreatitis, Hx of  (ICD-V12.70) 4)  End Stage Renal Disease  (ICD-585.6) 5)  CHF  (ICD-428.0) 6)  Human Immunodeficiency Virus [hiv]  (ICD-042) 7)  Hypertension  (ICD-401.9) 8)  Gerd  (ICD-530.81) 9)  Pain in Joint, Upper Arm  (ICD-719.42) 10)  Abdominal Wall Hernia  (ICD-553.20) 11)  Bronchitis, Acute  (ICD-466.0) 12)   Hypokalemia, Hx of  (ICD-V12.2) 13)  Pneumonia  (ICD-486) 14)  Headache  (ICD-784.0) 15)  Hx of Hysterectomy, Total, Hx of  (ICD-V45.77) 16)  Shingles, Hx of  (ICD-V13.8) 17)  Mass, Right Axilla  (ICD-782.2) 18)  Symptom, Enlargement, Lymph Nodes  (ICD-785.6) 19)  Anemia, Normocytic, Chronic  (ICD-285.9) 20)  Renal Disease  (ICD-593.9) 21)  Pancreatitis  (ICD-577.0) 22)  Hypertension  (ICD-401.9) 23)  Gerd  (ICD-530.81) 24)  Depression  (ICD-311) 25)  Anxiety  (ICD-300.00)  Current Medications (verified): 1)  Vicodin 5-500 Mg Tabs (Hydrocodone-Acetaminophen) .... Take 1 Tablet By Mouth Every 6 Hours As Needed 2)  Isentress 400 Mg Tabs (Raltegravir Potassium) .Marland Kitchen.. 1 Tab By Mouth Two Times A Day 3)  Revatio 20 Mg Tabs (Sildenafil Citrate) .Marland Kitchen.. 1 Tablet By Mouth Three Times Daily 4)  Renvela 800 Mg Tabs (Sevelamer Carbonate) .... Two Tablets By Mouth Three Times A Day 5)  Rena-Vite  Tabs (B Complex-C-Folic Acid) .Marland Kitchen.. 1 Tablet By Mouth At Bedtime 6)  Zofran 4 Mg Tabs (Ondansetron Hcl) .... 1/2 Tablet As Needed 7)  Zemplar  Injection .... 2.5 Micrograms Iv 3 Times A Week At Dialysis 8)  Epivir 100 Mg Tabs (Lamivudine) .... Take One-Half Tablet Daily 9)  Cardizem 30 Mg Tabs (Diltiazem Hcl) .... Take 1 Tablet By Mouth Two Times A Day 10)  Mylanta Chews .... As Needed 11)  Tylenol 325 Mg .... Take Two Tablets Every 4 Hours As Needed For Pain 12)  Ventolin Hfa 108 (90 Base) Mcg/act Aers (Albuterol Sulfate) .... As Needed 13)  Alprazolam 0.5 Mg Tabs (Alprazolam) .... One Tablet Twice Daily As Needed 14)  Epogen 10000 Unit/ml Soln (Epoetin Alfa) .... Administer 0.46ml Subcutaneously  To Be Given @ Dialysis 15)  Flora-Q  Caps (Probiotic Product) .... One Tablet Daily 16)  Protonix 40 Mg Solr (Pantoprazole Sodium) .... Take 1 Tablet By Mouth Once A Day 17)  Questran 4 Gm Pack (Cholestyramine) .... 1/2 Scoop (2 Gm) in Liquid Twice Daily 18)  Retrovir 100 Mg Caps (Zidovudine) .... Take 1 Tablet By  Mouth Three Times A Day 19)  Fosrenol 500 Mg Chew (Lanthanum Carbonate) .... Chew One Tablet Three Times Daily With Meals 20)  Fosrenol 500 Mg Chew (Lanthanum Carbonate) .... 1/2 Tablet By Mouth Three Times Daily With Snacks 21)  Prostat 64 .Marland Kitchen.. 30 Cc By Mouth Three Times Daily With Meals  Allergies (verified): 1)  ! 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  Alcohol Use -  yes in the past  Risk Factors: Alcohol Use: 0 (11/07/2008) Caffeine Use: occassionally (11/07/2008) Exercise: no (11/07/2008)  Risk Factors: Smoking Status: quit (04/10/2009) Passive Smoke Exposure: no (11/07/2008)  Family History: pt unable to give  Social History: Marital Status: widow Children: 1 Occupation: retired Patient is a former smoker.  Alcohol Use -  yes in the past Smoking Status:  quit  Vital Signs:  Patient profile:   60 year old female Menstrual status:  postmenopausal Height:      60 inches Temp:     98.4 degrees F oral Pulse rate:   76 / minute BP sitting:   122 / 80  (right arm) Cuff size:   regular  Vitals Entered By: Burnadette Peter LPN (February  9, 624THL 9:14 AM)  Physical Exam  General:  frail elderly lady in the wheelchair to me by her caregiver Ms. Bullins Eyes:  no scleral icterus Heart:  heart exam  regular rhythm somewhat hyperdynamic no appreciable murmur gallop or rub Abdomen:  positive bowel sounds. nondistended. Soft positive bowel sounds. She may have a little of diastases rectal upper abdomen nausea and organomegaly disease examined in a wheelchair was reduced. No lower extremity edema  Impression & Recommendations: Impression: Unfortunate lady with end-stage HIV, renal failure, multiple comorbidities with recent diarrhea. She appears to have clearly gotten improvement with a course of Diflucan for the yeast found her stool back during her  November hospitalization. She describes intermittent diarrhea at times; on balance, much better than it was when she was hospitalized in November 2010. She is on low-dose Questran which seems to be working well with this nice lady. She is not interested in further evaluation including endoscopy etc. at this time. In fact, it appears that she does not really need further evaluation at this time.  GERD symptoms well-controlled on Nexium  Recommendations: Continue Questran 2 g orally twice daily. Patient/staff admonished not to give Questran within 2 hours of her other other medication administration times. Continue Nexium for GERD  We'll see back on a p.r.n. basis.  Appended Document: Orders Update-charge    Clinical Lists Changes  Problems: Added new problem of DIARRHEA (ICD-787.91) Orders: Added new Service order of Est. Patient Level IV [id number]) - Signed

## 2010-05-13 ENCOUNTER — Telehealth: Payer: Self-pay | Admitting: *Deleted

## 2010-05-13 LAB — CBC
HCT: 35.8 % — ABNORMAL LOW (ref 36.0–46.0)
HCT: 36.5 % (ref 36.0–46.0)
Hemoglobin: 11.1 g/dL — ABNORMAL LOW (ref 12.0–15.0)
Hemoglobin: 11.7 g/dL — ABNORMAL LOW (ref 12.0–15.0)
Hemoglobin: 11.9 g/dL — ABNORMAL LOW (ref 12.0–15.0)
Hemoglobin: 9.4 g/dL — ABNORMAL LOW (ref 12.0–15.0)
Hemoglobin: 9.9 g/dL — ABNORMAL LOW (ref 12.0–15.0)
MCH: 33.1 pg (ref 26.0–34.0)
MCH: 33.4 pg (ref 26.0–34.0)
MCH: 33.9 pg (ref 26.0–34.0)
MCHC: 32.2 g/dL (ref 30.0–36.0)
MCV: 101.7 fL — ABNORMAL HIGH (ref 78.0–100.0)
MCV: 103.2 fL — ABNORMAL HIGH (ref 78.0–100.0)
RBC: 2.8 MIL/uL — ABNORMAL LOW (ref 3.87–5.11)
RBC: 2.93 MIL/uL — ABNORMAL LOW (ref 3.87–5.11)
RBC: 3.59 MIL/uL — ABNORMAL LOW (ref 3.87–5.11)
RDW: 16.8 % — ABNORMAL HIGH (ref 11.5–15.5)
WBC: 3 10*3/uL — ABNORMAL LOW (ref 4.0–10.5)
WBC: 3.3 10*3/uL — ABNORMAL LOW (ref 4.0–10.5)
WBC: 4.9 10*3/uL (ref 4.0–10.5)
WBC: 6.9 10*3/uL (ref 4.0–10.5)

## 2010-05-13 LAB — COMPREHENSIVE METABOLIC PANEL
Albumin: 3.1 g/dL — ABNORMAL LOW (ref 3.5–5.2)
Alkaline Phosphatase: 54 U/L (ref 39–117)
Alkaline Phosphatase: 76 U/L (ref 39–117)
BUN: 29 mg/dL — ABNORMAL HIGH (ref 6–23)
BUN: 44 mg/dL — ABNORMAL HIGH (ref 6–23)
CO2: 23 mEq/L (ref 19–32)
Chloride: 100 mEq/L (ref 96–112)
Creatinine, Ser: 9 mg/dL — ABNORMAL HIGH (ref 0.4–1.2)
GFR calc non Af Amer: 4 mL/min — ABNORMAL LOW (ref >60)
GFR calc non Af Amer: 5 mL/min — ABNORMAL LOW (ref >60)
Glucose, Bld: 110 mg/dL — ABNORMAL HIGH (ref 70–99)
Glucose, Bld: 88 mg/dL (ref 70–99)
Potassium: 4.5 mEq/L (ref 3.5–5.1)
Potassium: 4.6 mEq/L (ref 3.5–5.1)
Total Bilirubin: 0.6 mg/dL (ref 0.3–1.2)
Total Bilirubin: 1.5 mg/dL — ABNORMAL HIGH (ref 0.3–1.2)
Total Protein: 9.9 g/dL — ABNORMAL HIGH (ref 6.0–8.3)

## 2010-05-13 LAB — DIFFERENTIAL
Basophils Absolute: 0 10*3/uL (ref 0.0–0.1)
Basophils Absolute: 0 10*3/uL (ref 0.0–0.1)
Basophils Relative: 0 % (ref 0–1)
Basophils Relative: 0 % (ref 0–1)
Eosinophils Absolute: 0 10*3/uL (ref 0.0–0.7)
Eosinophils Absolute: 0 10*3/uL (ref 0.0–0.7)
Eosinophils Relative: 0 % (ref 0–5)
Lymphocytes Relative: 16 % (ref 12–46)
Lymphocytes Relative: 35 % (ref 12–46)
Lymphs Abs: 0.5 10*3/uL — ABNORMAL LOW (ref 0.7–4.0)
Lymphs Abs: 0.6 10*3/uL — ABNORMAL LOW (ref 0.7–4.0)
Lymphs Abs: 0.8 10*3/uL (ref 0.7–4.0)
Monocytes Absolute: 0.5 10*3/uL (ref 0.1–1.0)
Monocytes Absolute: 0.5 10*3/uL (ref 0.1–1.0)
Monocytes Relative: 10 % (ref 3–12)
Monocytes Relative: 16 % — ABNORMAL HIGH (ref 3–12)
Monocytes Relative: 6 % (ref 3–12)
Monocytes Relative: 8 % (ref 3–12)
Neutro Abs: 1.6 10*3/uL — ABNORMAL LOW (ref 1.7–7.7)
Neutro Abs: 3.5 10*3/uL (ref 1.7–7.7)
Neutro Abs: 5.9 10*3/uL (ref 1.7–7.7)
Neutrophils Relative %: 67 % (ref 43–77)
Neutrophils Relative %: 73 % (ref 43–77)
Neutrophils Relative %: 85 % — ABNORMAL HIGH (ref 43–77)

## 2010-05-13 LAB — BASIC METABOLIC PANEL
BUN: 35 mg/dL — ABNORMAL HIGH (ref 6–23)
CO2: 24 mEq/L (ref 19–32)
CO2: 28 mEq/L (ref 19–32)
Calcium: 8 mg/dL — ABNORMAL LOW (ref 8.4–10.5)
Calcium: 8.4 mg/dL (ref 8.4–10.5)
Calcium: 8.5 mg/dL (ref 8.4–10.5)
Calcium: 8.6 mg/dL (ref 8.4–10.5)
Chloride: 103 mEq/L (ref 96–112)
Chloride: 103 mEq/L (ref 96–112)
GFR calc Af Amer: 4 mL/min — ABNORMAL LOW (ref >60)
GFR calc Af Amer: 5 mL/min — ABNORMAL LOW (ref >60)
GFR calc Af Amer: 6 mL/min — ABNORMAL LOW (ref >60)
GFR calc Af Amer: 7 mL/min — ABNORMAL LOW (ref >60)
GFR calc non Af Amer: 3 mL/min — ABNORMAL LOW (ref >60)
GFR calc non Af Amer: 5 mL/min — ABNORMAL LOW (ref >60)
Glucose, Bld: 93 mg/dL (ref 70–99)
Potassium: 3.9 mEq/L (ref 3.5–5.1)
Sodium: 135 mEq/L (ref 135–145)
Sodium: 136 mEq/L (ref 135–145)
Sodium: 140 mEq/L (ref 135–145)

## 2010-05-13 LAB — T-HELPER CELLS (CD4) COUNT (NOT AT ARMC): CD4 T Cell Abs: 80 uL — ABNORMAL LOW (ref 400–2700)

## 2010-05-13 LAB — CULTURE, BLOOD (ROUTINE X 2): Culture: NO GROWTH

## 2010-05-13 LAB — CARDIAC PANEL(CRET KIN+CKTOT+MB+TROPI)
CK, MB: 1.5 ng/mL (ref 0.3–4.0)
Total CK: 24 U/L (ref 7–177)
Troponin I: 0.03 ng/mL (ref 0.00–0.06)

## 2010-05-13 LAB — POCT CARDIAC MARKERS: Myoglobin, poc: 366 ng/mL (ref 12–200)

## 2010-05-13 LAB — LACTIC ACID, PLASMA: Lactic Acid, Venous: 1 mmol/L (ref 0.5–2.2)

## 2010-05-13 LAB — MAGNESIUM
Magnesium: 1.3 mg/dL — ABNORMAL LOW (ref 1.5–2.5)
Magnesium: 2.1 mg/dL (ref 1.5–2.5)

## 2010-05-13 LAB — PHOSPHORUS: Phosphorus: 8.6 mg/dL — ABNORMAL HIGH (ref 2.3–4.6)

## 2010-05-13 LAB — PROTIME-INR: Prothrombin Time: 14.4 seconds (ref 11.6–15.2)

## 2010-05-20 LAB — CBC
HCT: 29.7 % — ABNORMAL LOW (ref 36.0–46.0)
HCT: 31.4 % — ABNORMAL LOW (ref 36.0–46.0)
HCT: 32.4 % — ABNORMAL LOW (ref 36.0–46.0)
HCT: 35.3 % — ABNORMAL LOW (ref 36.0–46.0)
Hemoglobin: 10 g/dL — ABNORMAL LOW (ref 12.0–15.0)
Hemoglobin: 10.6 g/dL — ABNORMAL LOW (ref 12.0–15.0)
Hemoglobin: 11 g/dL — ABNORMAL LOW (ref 12.0–15.0)
MCHC: 33 g/dL (ref 30.0–36.0)
MCHC: 33.3 g/dL (ref 30.0–36.0)
MCHC: 33.6 g/dL (ref 30.0–36.0)
MCHC: 34 g/dL (ref 30.0–36.0)
MCV: 98.6 fL (ref 78.0–100.0)
MCV: 98.7 fL (ref 78.0–100.0)
Platelets: 107 10*3/uL — ABNORMAL LOW (ref 150–400)
Platelets: 59 10*3/uL — ABNORMAL LOW (ref 150–400)
Platelets: 63 10*3/uL — ABNORMAL LOW (ref 150–400)
RBC: 3.16 MIL/uL — ABNORMAL LOW (ref 3.87–5.11)
RBC: 3.22 MIL/uL — ABNORMAL LOW (ref 3.87–5.11)
RDW: 17.1 % — ABNORMAL HIGH (ref 11.5–15.5)
RDW: 17.2 % — ABNORMAL HIGH (ref 11.5–15.5)
RDW: 17.4 % — ABNORMAL HIGH (ref 11.5–15.5)
WBC: 6.9 10*3/uL (ref 4.0–10.5)

## 2010-05-20 LAB — BASIC METABOLIC PANEL
BUN: 42 mg/dL — ABNORMAL HIGH (ref 6–23)
BUN: 43 mg/dL — ABNORMAL HIGH (ref 6–23)
BUN: 73 mg/dL — ABNORMAL HIGH (ref 6–23)
CO2: 22 mEq/L (ref 19–32)
CO2: 24 mEq/L (ref 19–32)
CO2: 25 mEq/L (ref 19–32)
CO2: 26 mEq/L (ref 19–32)
Chloride: 91 mEq/L — ABNORMAL LOW (ref 96–112)
Creatinine, Ser: 11.54 mg/dL — ABNORMAL HIGH (ref 0.4–1.2)
GFR calc Af Amer: 4 mL/min — ABNORMAL LOW (ref >60)
GFR calc Af Amer: 5 mL/min — ABNORMAL LOW (ref >60)
GFR calc non Af Amer: 3 mL/min — ABNORMAL LOW (ref >60)
GFR calc non Af Amer: 4 mL/min — ABNORMAL LOW (ref >60)
GFR calc non Af Amer: 4 mL/min — ABNORMAL LOW (ref >60)
Glucose, Bld: 146 mg/dL — ABNORMAL HIGH (ref 70–99)
Glucose, Bld: 87 mg/dL (ref 70–99)
Glucose, Bld: 98 mg/dL (ref 70–99)
Potassium: 3 mEq/L — ABNORMAL LOW (ref 3.5–5.1)
Potassium: 3.5 mEq/L (ref 3.5–5.1)
Potassium: 3.5 mEq/L (ref 3.5–5.1)
Potassium: 4.5 mEq/L (ref 3.5–5.1)
Sodium: 128 mEq/L — ABNORMAL LOW (ref 135–145)
Sodium: 128 mEq/L — ABNORMAL LOW (ref 135–145)
Sodium: 130 mEq/L — ABNORMAL LOW (ref 135–145)
Sodium: 131 mEq/L — ABNORMAL LOW (ref 135–145)

## 2010-05-20 LAB — CULTURE, BLOOD (ROUTINE X 2)
Culture: NO GROWTH
Report Status: 4282011
Report Status: 4282011

## 2010-05-20 LAB — DIFFERENTIAL
Basophils Absolute: 0 10*3/uL (ref 0.0–0.1)
Basophils Absolute: 0 10*3/uL (ref 0.0–0.1)
Basophils Absolute: 0 10*3/uL (ref 0.0–0.1)
Basophils Relative: 0 % (ref 0–1)
Basophils Relative: 0 % (ref 0–1)
Basophils Relative: 0 % (ref 0–1)
Eosinophils Absolute: 0 10*3/uL (ref 0.0–0.7)
Eosinophils Absolute: 0.1 10*3/uL (ref 0.0–0.7)
Eosinophils Absolute: 0.1 10*3/uL (ref 0.0–0.7)
Eosinophils Relative: 0 % (ref 0–5)
Eosinophils Relative: 1 % (ref 0–5)
Eosinophils Relative: 2 % (ref 0–5)
Eosinophils Relative: 2 % (ref 0–5)
Lymphocytes Relative: 13 % (ref 12–46)
Lymphocytes Relative: 9 % — ABNORMAL LOW (ref 12–46)
Lymphocytes Relative: 9 % — ABNORMAL LOW (ref 12–46)
Lymphs Abs: 0.6 10*3/uL — ABNORMAL LOW (ref 0.7–4.0)
Monocytes Absolute: 0.4 10*3/uL (ref 0.1–1.0)
Monocytes Absolute: 0.4 10*3/uL (ref 0.1–1.0)
Monocytes Absolute: 0.9 10*3/uL (ref 0.1–1.0)
Monocytes Relative: 11 % (ref 3–12)
Monocytes Relative: 8 % (ref 3–12)

## 2010-05-20 LAB — STOOL CULTURE

## 2010-05-20 NOTE — Progress Notes (Signed)
  Phone Note From Other Clinic   Caller: Provider Summary of Call: the staff at her AL facility asked if we draw labs or if they do. has visit soon. check with lab staff. we will do the labs when she is here for the visit Initial call taken by: Elige Radon RN,  May 13, 2010 3:02 PM

## 2010-06-03 LAB — BASIC METABOLIC PANEL
BUN: 25 mg/dL — ABNORMAL HIGH (ref 6–23)
BUN: 30 mg/dL — ABNORMAL HIGH (ref 6–23)
CO2: 24 mEq/L (ref 19–32)
Calcium: 8.2 mg/dL — ABNORMAL LOW (ref 8.4–10.5)
Chloride: 100 mEq/L (ref 96–112)
Creatinine, Ser: 8.28 mg/dL — ABNORMAL HIGH (ref 0.4–1.2)
Creatinine, Ser: 8.31 mg/dL — ABNORMAL HIGH (ref 0.4–1.2)
GFR calc Af Amer: 6 mL/min — ABNORMAL LOW (ref >60)
GFR calc non Af Amer: 5 mL/min — ABNORMAL LOW (ref >60)
Glucose, Bld: 79 mg/dL (ref 70–99)
Potassium: 3.7 mEq/L (ref 3.5–5.1)

## 2010-06-03 LAB — CBC
HCT: 30.3 % — ABNORMAL LOW (ref 36.0–46.0)
MCHC: 32 g/dL (ref 30.0–36.0)
MCV: 101.2 fL — ABNORMAL HIGH (ref 78.0–100.0)
Platelets: 101 10*3/uL — ABNORMAL LOW (ref 150–400)
Platelets: 116 10*3/uL — ABNORMAL LOW (ref 150–400)
RBC: 3.08 MIL/uL — ABNORMAL LOW (ref 3.87–5.11)
WBC: 2.4 10*3/uL — ABNORMAL LOW (ref 4.0–10.5)

## 2010-06-03 LAB — POCT CARDIAC MARKERS
CKMB, poc: 1.2 ng/mL (ref 1.0–8.0)
Myoglobin, poc: 406 ng/mL (ref 12–200)
Troponin i, poc: <0.05 ng/mL (ref 0.00–0.09)

## 2010-06-03 LAB — DIFFERENTIAL
Basophils Relative: 0 % (ref 0–1)
Eosinophils Absolute: 0 10*3/uL (ref 0.0–0.7)
Eosinophils Relative: 1 % (ref 0–5)
Lymphs Abs: 0.6 10*3/uL — ABNORMAL LOW (ref 0.7–4.0)
Monocytes Relative: 10 % (ref 3–12)
Neutro Abs: 1.6 10*3/uL — ABNORMAL LOW (ref 1.7–7.7)
Neutrophils Relative %: 63 % (ref 43–77)
Neutrophils Relative %: 65 % (ref 43–77)

## 2010-06-03 LAB — HEMOCCULT GUIAC POC 1CARD (OFFICE): Fecal Occult Bld: NEGATIVE

## 2010-06-03 LAB — LACTIC ACID, PLASMA: Lactic Acid, Venous: 1.3 mmol/L (ref 0.5–2.2)

## 2010-06-03 LAB — OVA AND PARASITE EXAMINATION: Ova and parasites: NONE SEEN

## 2010-06-03 LAB — LACTATE DEHYDROGENASE: LDH: 88 U/L — ABNORMAL LOW (ref 94–250)

## 2010-06-03 LAB — AFB CULTURE WITH SMEAR (NOT AT ARMC)

## 2010-06-04 LAB — CULTURE, BLOOD (ROUTINE X 2): Culture: NO GROWTH

## 2010-06-04 LAB — CLOSTRIDIUM DIFFICILE EIA

## 2010-06-04 LAB — RENAL FUNCTION PANEL
BUN: 41 mg/dL — ABNORMAL HIGH (ref 6–23)
CO2: 25 mEq/L (ref 19–32)
Chloride: 94 mEq/L — ABNORMAL LOW (ref 96–112)
Creatinine, Ser: 7.94 mg/dL — ABNORMAL HIGH (ref 0.4–1.2)

## 2010-06-04 LAB — BASIC METABOLIC PANEL
BUN: 29 mg/dL — ABNORMAL HIGH (ref 6–23)
BUN: 33 mg/dL — ABNORMAL HIGH (ref 6–23)
BUN: 40 mg/dL — ABNORMAL HIGH (ref 6–23)
BUN: 57 mg/dL — ABNORMAL HIGH (ref 6–23)
CO2: 21 mEq/L (ref 19–32)
CO2: 24 mEq/L (ref 19–32)
CO2: 25 mEq/L (ref 19–32)
CO2: 25 mEq/L (ref 19–32)
Calcium: 7.9 mg/dL — ABNORMAL LOW (ref 8.4–10.5)
Calcium: 7.9 mg/dL — ABNORMAL LOW (ref 8.4–10.5)
Calcium: 8.5 mg/dL (ref 8.4–10.5)
Calcium: 8.6 mg/dL (ref 8.4–10.5)
Chloride: 100 mEq/L (ref 96–112)
Chloride: 94 mEq/L — ABNORMAL LOW (ref 96–112)
Chloride: 98 mEq/L (ref 96–112)
Creatinine, Ser: 10.71 mg/dL — ABNORMAL HIGH (ref 0.4–1.2)
Creatinine, Ser: 7.08 mg/dL — ABNORMAL HIGH (ref 0.4–1.2)
Creatinine, Ser: 8.24 mg/dL — ABNORMAL HIGH (ref 0.4–1.2)
Creatinine, Ser: 8.49 mg/dL — ABNORMAL HIGH (ref 0.4–1.2)
Creatinine, Ser: 9.04 mg/dL — ABNORMAL HIGH (ref 0.4–1.2)
Creatinine, Ser: 9.3 mg/dL — ABNORMAL HIGH (ref 0.4–1.2)
GFR calc Af Amer: 5 mL/min — ABNORMAL LOW (ref >60)
GFR calc Af Amer: 5 mL/min — ABNORMAL LOW (ref >60)
GFR calc Af Amer: 6 mL/min — ABNORMAL LOW (ref >60)
GFR calc non Af Amer: 4 mL/min — ABNORMAL LOW (ref >60)
GFR calc non Af Amer: 5 mL/min — ABNORMAL LOW (ref >60)
GFR calc non Af Amer: 5 mL/min — ABNORMAL LOW (ref >60)
GFR calc non Af Amer: 5 mL/min — ABNORMAL LOW (ref >60)
GFR calc non Af Amer: 6 mL/min — ABNORMAL LOW (ref >60)
Glucose, Bld: 100 mg/dL — ABNORMAL HIGH (ref 70–99)
Glucose, Bld: 85 mg/dL (ref 70–99)
Glucose, Bld: 92 mg/dL (ref 70–99)
Glucose, Bld: 94 mg/dL (ref 70–99)
Glucose, Bld: 96 mg/dL (ref 70–99)
Potassium: 3.8 mEq/L (ref 3.5–5.1)
Potassium: 3.9 mEq/L (ref 3.5–5.1)
Potassium: 4 mEq/L (ref 3.5–5.1)
Sodium: 127 mEq/L — ABNORMAL LOW (ref 135–145)

## 2010-06-04 LAB — DIFFERENTIAL
Basophils Absolute: 0 10*3/uL (ref 0.0–0.1)
Basophils Absolute: 0 10*3/uL (ref 0.0–0.1)
Basophils Absolute: 0 10*3/uL (ref 0.0–0.1)
Basophils Absolute: 0 10*3/uL (ref 0.0–0.1)
Basophils Relative: 0 % (ref 0–1)
Basophils Relative: 1 % (ref 0–1)
Eosinophils Absolute: 0 10*3/uL (ref 0.0–0.7)
Eosinophils Absolute: 0.1 10*3/uL (ref 0.0–0.7)
Eosinophils Relative: 0 % (ref 0–5)
Eosinophils Relative: 0 % (ref 0–5)
Lymphocytes Relative: 16 % (ref 12–46)
Lymphocytes Relative: 20 % (ref 12–46)
Lymphocytes Relative: 7 % — ABNORMAL LOW (ref 12–46)
Lymphocytes Relative: 8 % — ABNORMAL LOW (ref 12–46)
Lymphocytes Relative: 9 % — ABNORMAL LOW (ref 12–46)
Lymphs Abs: 0.5 10*3/uL — ABNORMAL LOW (ref 0.7–4.0)
Lymphs Abs: 0.6 10*3/uL — ABNORMAL LOW (ref 0.7–4.0)
Monocytes Absolute: 0.3 10*3/uL (ref 0.1–1.0)
Monocytes Absolute: 0.4 10*3/uL (ref 0.1–1.0)
Monocytes Relative: 21 % — ABNORMAL HIGH (ref 3–12)
Monocytes Relative: 6 % (ref 3–12)
Monocytes Relative: 6 % (ref 3–12)
Neutro Abs: 1.6 10*3/uL — ABNORMAL LOW (ref 1.7–7.7)
Neutro Abs: 3.5 10*3/uL (ref 1.7–7.7)
Neutro Abs: 6.4 10*3/uL (ref 1.7–7.7)
Neutrophils Relative %: 56 % (ref 43–77)
Neutrophils Relative %: 81 % — ABNORMAL HIGH (ref 43–77)
Neutrophils Relative %: 85 % — ABNORMAL HIGH (ref 43–77)

## 2010-06-04 LAB — CARDIAC PANEL(CRET KIN+CKTOT+MB+TROPI)
CK, MB: 1 ng/mL (ref 0.3–4.0)
CK, MB: 1.3 ng/mL (ref 0.3–4.0)
CK, MB: 1.4 ng/mL (ref 0.3–4.0)
CK, MB: 1.5 ng/mL (ref 0.3–4.0)
Relative Index: INVALID (ref 0.0–2.5)
Relative Index: INVALID (ref 0.0–2.5)
Relative Index: INVALID (ref 0.0–2.5)
Relative Index: INVALID (ref 0.0–2.5)
Relative Index: INVALID (ref 0.0–2.5)
Total CK: 27 U/L (ref 7–177)
Total CK: 35 U/L (ref 7–177)
Troponin I: 0.09 ng/mL — ABNORMAL HIGH (ref 0.00–0.06)
Troponin I: 0.1 ng/mL — ABNORMAL HIGH (ref 0.00–0.06)
Troponin I: 0.11 ng/mL — ABNORMAL HIGH (ref 0.00–0.06)
Troponin I: 0.18 ng/mL — ABNORMAL HIGH (ref 0.00–0.06)

## 2010-06-04 LAB — CBC
HCT: 33.9 % — ABNORMAL LOW (ref 36.0–46.0)
Hemoglobin: 11.6 g/dL — ABNORMAL LOW (ref 12.0–15.0)
MCHC: 32.2 g/dL (ref 30.0–36.0)
MCHC: 32.3 g/dL (ref 30.0–36.0)
MCHC: 32.4 g/dL (ref 30.0–36.0)
MCHC: 32.8 g/dL (ref 30.0–36.0)
MCV: 100.8 fL — ABNORMAL HIGH (ref 78.0–100.0)
MCV: 103.2 fL — ABNORMAL HIGH (ref 78.0–100.0)
MCV: 103.5 fL — ABNORMAL HIGH (ref 78.0–100.0)
Platelets: 61 10*3/uL — ABNORMAL LOW (ref 150–400)
Platelets: 73 10*3/uL — ABNORMAL LOW (ref 150–400)
Platelets: 81 10*3/uL — ABNORMAL LOW (ref 150–400)
Platelets: 82 10*3/uL — ABNORMAL LOW (ref 150–400)
Platelets: 92 10*3/uL — ABNORMAL LOW (ref 150–400)
RBC: 3.36 MIL/uL — ABNORMAL LOW (ref 3.87–5.11)
RBC: 3.5 MIL/uL — ABNORMAL LOW (ref 3.87–5.11)
RDW: 16 % — ABNORMAL HIGH (ref 11.5–15.5)
RDW: 16.5 % — ABNORMAL HIGH (ref 11.5–15.5)
RDW: 16.7 % — ABNORMAL HIGH (ref 11.5–15.5)
RDW: 16.9 % — ABNORMAL HIGH (ref 11.5–15.5)
WBC: 2.6 10*3/uL — ABNORMAL LOW (ref 4.0–10.5)

## 2010-06-04 LAB — FECAL LACTOFERRIN, QUANT: Fecal Lactoferrin: POSITIVE

## 2010-06-04 LAB — COMPREHENSIVE METABOLIC PANEL
ALT: 17 U/L (ref 0–35)
ALT: 18 U/L (ref 0–35)
Albumin: 2.6 g/dL — ABNORMAL LOW (ref 3.5–5.2)
Albumin: 2.9 g/dL — ABNORMAL LOW (ref 3.5–5.2)
Alkaline Phosphatase: 71 U/L (ref 39–117)
Alkaline Phosphatase: 77 U/L (ref 39–117)
BUN: 52 mg/dL — ABNORMAL HIGH (ref 6–23)
Calcium: 8.7 mg/dL (ref 8.4–10.5)
Chloride: 98 mEq/L (ref 96–112)
GFR calc Af Amer: 4 mL/min — ABNORMAL LOW (ref >60)
Glucose, Bld: 83 mg/dL (ref 70–99)
Glucose, Bld: 90 mg/dL (ref 70–99)
Potassium: 5.1 mEq/L (ref 3.5–5.1)
Potassium: 6 mEq/L — ABNORMAL HIGH (ref 3.5–5.1)
Sodium: 128 mEq/L — ABNORMAL LOW (ref 135–145)
Sodium: 131 mEq/L — ABNORMAL LOW (ref 135–145)
Total Bilirubin: 1.3 mg/dL — ABNORMAL HIGH (ref 0.3–1.2)
Total Protein: 10.3 g/dL — ABNORMAL HIGH (ref 6.0–8.3)
Total Protein: 11.1 g/dL — ABNORMAL HIGH (ref 6.0–8.3)

## 2010-06-04 LAB — BLOOD GAS, ARTERIAL
Bicarbonate: 23.8 mEq/L (ref 20.0–24.0)
O2 Content: 2 L/min
O2 Saturation: 98.9 %
Patient temperature: 37
pH, Arterial: 7.426 — ABNORMAL HIGH (ref 7.350–7.400)

## 2010-06-04 LAB — URINE MICROSCOPIC-ADD ON

## 2010-06-04 LAB — URINALYSIS, ROUTINE W REFLEX MICROSCOPIC
Bilirubin Urine: NEGATIVE
Glucose, UA: NEGATIVE mg/dL
Ketones, ur: NEGATIVE mg/dL
Leukocytes, UA: NEGATIVE
Protein, ur: 100 mg/dL — AB
pH: 7.5 (ref 5.0–8.0)

## 2010-06-04 LAB — OVA AND PARASITE EXAMINATION

## 2010-06-04 LAB — AFB CULTURE WITH SMEAR (NOT AT ARMC): Acid Fast Smear: NONE SEEN

## 2010-06-04 LAB — GLUCOSE, CAPILLARY: Glucose-Capillary: 80 mg/dL (ref 70–99)

## 2010-06-04 LAB — CRYPTOSPORIDIUM SMEAR, FECAL

## 2010-06-04 LAB — STOOL CULTURE

## 2010-06-04 LAB — LIPID PANEL
Cholesterol: 62 mg/dL (ref 0–200)
HDL: 28 mg/dL — ABNORMAL LOW (ref >39)
LDL Cholesterol: 24 mg/dL (ref 0–99)

## 2010-06-04 LAB — T-HELPER CELLS (CD4) COUNT (NOT AT ARMC): CD4 T Cell Abs: 80 uL — ABNORMAL LOW (ref 400–2700)

## 2010-06-04 LAB — HEPATITIS B SURFACE ANTIGEN: Hepatitis B Surface Ag: NEGATIVE

## 2010-06-04 LAB — POTASSIUM: Potassium: 6 mEq/L — ABNORMAL HIGH (ref 3.5–5.1)

## 2010-06-06 LAB — T-HELPER CELL (CD4) - (RCID CLINIC ONLY)
CD4 % Helper T Cell: 18 % — ABNORMAL LOW (ref 33–55)
CD4 T Cell Abs: 140 uL — ABNORMAL LOW (ref 400–2700)

## 2010-06-09 LAB — T-HELPER CELL (CD4) - (RCID CLINIC ONLY): CD4 % Helper T Cell: 20 % — ABNORMAL LOW (ref 33–55)

## 2010-06-11 ENCOUNTER — Ambulatory Visit (INDEPENDENT_AMBULATORY_CARE_PROVIDER_SITE_OTHER): Payer: Medicare Other | Admitting: Infectious Diseases

## 2010-06-11 ENCOUNTER — Encounter: Payer: Self-pay | Admitting: Infectious Diseases

## 2010-06-11 VITALS — BP 156/104 | HR 73 | Temp 99.0°F | Ht 62.0 in | Wt 130.0 lb

## 2010-06-11 DIAGNOSIS — Z21 Asymptomatic human immunodeficiency virus [HIV] infection status: Secondary | ICD-10-CM

## 2010-06-11 DIAGNOSIS — B20 Human immunodeficiency virus [HIV] disease: Secondary | ICD-10-CM

## 2010-06-11 LAB — RENAL FUNCTION PANEL
Albumin: 2.1 g/dL — ABNORMAL LOW (ref 3.5–5.2)
Albumin: 2.4 g/dL — ABNORMAL LOW (ref 3.5–5.2)
Albumin: 2.4 g/dL — ABNORMAL LOW (ref 3.5–5.2)
BUN: 69 mg/dL — ABNORMAL HIGH (ref 6–23)
BUN: 45 mg/dL — ABNORMAL HIGH (ref 6–23)
CO2: 22 mEq/L (ref 19–32)
CO2: 26 mEq/L (ref 19–32)
CO2: 25 mEq/L (ref 19–32)
Calcium: 8.4 mg/dL (ref 8.4–10.5)
Calcium: 8.5 mg/dL (ref 8.4–10.5)
Calcium: 8.6 mg/dL (ref 8.4–10.5)
Chloride: 101 mEq/L (ref 96–112)
Chloride: 102 mEq/L (ref 96–112)
Creatinine, Ser: 11.8 mg/dL — ABNORMAL HIGH (ref 0.4–1.2)
Creatinine, Ser: 9.6 mg/dL — ABNORMAL HIGH (ref 0.4–1.2)
GFR calc Af Amer: 4 mL/min — ABNORMAL LOW (ref >60)
GFR calc Af Amer: 5 mL/min — ABNORMAL LOW (ref >60)
GFR calc Af Amer: 5 mL/min — ABNORMAL LOW (ref >60)
GFR calc Af Amer: 6 mL/min — ABNORMAL LOW (ref >60)
GFR calc non Af Amer: 3 mL/min — ABNORMAL LOW (ref >60)
GFR calc non Af Amer: 4 mL/min — ABNORMAL LOW (ref >60)
GFR calc non Af Amer: 5 mL/min — ABNORMAL LOW (ref >60)
Glucose, Bld: 74 mg/dL (ref 70–99)
Glucose, Bld: 81 mg/dL (ref 70–99)
Glucose, Bld: 88 mg/dL (ref 70–99)
Glucose, Bld: 76 mg/dL (ref 70–99)
Phosphorus: 2.8 mg/dL (ref 2.3–4.6)
Phosphorus: 4.1 mg/dL (ref 2.3–4.6)
Phosphorus: 4.9 mg/dL — ABNORMAL HIGH (ref 2.3–4.6)
Phosphorus: 5.4 mg/dL — ABNORMAL HIGH (ref 2.3–4.6)
Phosphorus: 3.3 mg/dL (ref 2.3–4.6)
Potassium: 4.4 mEq/L (ref 3.5–5.1)
Potassium: 5.2 mEq/L — ABNORMAL HIGH (ref 3.5–5.1)
Potassium: 5.3 mEq/L — ABNORMAL HIGH (ref 3.5–5.1)
Sodium: 127 mEq/L — ABNORMAL LOW (ref 135–145)
Sodium: 129 mEq/L — ABNORMAL LOW (ref 135–145)
Sodium: 131 mEq/L — ABNORMAL LOW (ref 135–145)

## 2010-06-11 LAB — COMPREHENSIVE METABOLIC PANEL
ALT: 43 U/L — ABNORMAL HIGH (ref 0–35)
AST: 51 U/L — ABNORMAL HIGH (ref 0–37)
Albumin: 2.7 g/dL — ABNORMAL LOW (ref 3.5–5.2)
Alkaline Phosphatase: 85 U/L (ref 39–117)
Alkaline Phosphatase: 100 U/L (ref 39–117)
BUN: 38 mg/dL — ABNORMAL HIGH (ref 6–23)
BUN: 32 mg/dL — ABNORMAL HIGH (ref 6–23)
CO2: 24 mEq/L (ref 19–32)
CO2: 27 mEq/L (ref 19–32)
Calcium: 9 mg/dL (ref 8.4–10.5)
Chloride: 97 mEq/L (ref 96–112)
Chloride: 99 mEq/L (ref 96–112)
Creatinine, Ser: 9.68 mg/dL — ABNORMAL HIGH (ref 0.4–1.2)
GFR calc Af Amer: 5 mL/min — ABNORMAL LOW (ref >60)
GFR calc non Af Amer: 4 mL/min — ABNORMAL LOW (ref >60)
Glucose, Bld: 99 mg/dL (ref 70–99)
Glucose, Bld: 88 mg/dL (ref 70–99)
Potassium: 3.5 mEq/L (ref 3.5–5.1)
Potassium: 4.5 mEq/L (ref 3.5–5.1)
Sodium: 128 mEq/L — ABNORMAL LOW (ref 135–145)
Total Bilirubin: 0.8 mg/dL (ref 0.3–1.2)
Total Bilirubin: 1 mg/dL (ref 0.3–1.2)
Total Protein: >12 g/dL — ABNORMAL HIGH (ref 6.0–8.3)

## 2010-06-11 LAB — CBC
HCT: 39.6 % (ref 36.0–46.0)
HCT: 39.1 % (ref 36.0–46.0)
HCT: 35.8 % — ABNORMAL LOW (ref 36.0–46.0)
HCT: 35.8 % — ABNORMAL LOW (ref 36.0–46.0)
Hemoglobin: 11.2 g/dL — ABNORMAL LOW (ref 12.0–15.0)
Hemoglobin: 12.4 g/dL (ref 12.0–15.0)
Hemoglobin: 12.8 g/dL (ref 12.0–15.0)
Hemoglobin: 11.5 g/dL — ABNORMAL LOW (ref 12.0–15.0)
Hemoglobin: 11.7 g/dL — ABNORMAL LOW (ref 12.0–15.0)
MCHC: 32.9 g/dL (ref 30.0–36.0)
MCHC: 33.3 g/dL (ref 30.0–36.0)
MCHC: 32.7 g/dL (ref 30.0–36.0)
MCV: 81.8 fL (ref 78.0–100.0)
MCV: 82.1 fL (ref 78.0–100.0)
MCV: 84.1 fL (ref 78.0–100.0)
Platelets: 71 10*3/uL — ABNORMAL LOW (ref 150–400)
Platelets: 80 10*3/uL — ABNORMAL LOW (ref 150–400)
Platelets: 81 10*3/uL — ABNORMAL LOW (ref 150–400)
Platelets: 71 10*3/uL — ABNORMAL LOW (ref 150–400)
RBC: 4.27 MIL/uL (ref 3.87–5.11)
RBC: 4.39 MIL/uL (ref 3.87–5.11)
RBC: 4.56 MIL/uL (ref 3.87–5.11)
RBC: 4.65 MIL/uL (ref 3.87–5.11)
RBC: 4.32 MIL/uL (ref 3.87–5.11)
RDW: 19.4 % — ABNORMAL HIGH (ref 11.5–15.5)
RDW: 19.8 % — ABNORMAL HIGH (ref 11.5–15.5)
RDW: 20.2 % — ABNORMAL HIGH (ref 11.5–15.5)
RDW: 22.5 % — ABNORMAL HIGH (ref 11.5–15.5)
RDW: 21.7 % — ABNORMAL HIGH (ref 11.5–15.5)
WBC: 3.2 10*3/uL — ABNORMAL LOW (ref 4.0–10.5)
WBC: 3.4 10*3/uL — ABNORMAL LOW (ref 4.0–10.5)
WBC: 3.7 10*3/uL — ABNORMAL LOW (ref 4.0–10.5)
WBC: 3.3 10*3/uL — ABNORMAL LOW (ref 4.0–10.5)
WBC: 3.4 10*3/uL — ABNORMAL LOW (ref 4.0–10.5)

## 2010-06-11 LAB — DIFFERENTIAL
Basophils Absolute: 0 10*3/uL (ref 0.0–0.1)
Basophils Absolute: 0 10*3/uL (ref 0.0–0.1)
Basophils Relative: 0 % (ref 0–1)
Basophils Relative: 0 % (ref 0–1)
Eosinophils Absolute: 0 10*3/uL (ref 0.0–0.7)
Eosinophils Relative: 1 % (ref 0–5)
Lymphocytes Relative: 25 % (ref 12–46)
Lymphs Abs: 0.9 10*3/uL (ref 0.7–4.0)
Monocytes Absolute: 0.6 10*3/uL (ref 0.1–1.0)
Monocytes Absolute: 0.4 10*3/uL (ref 0.1–1.0)
Monocytes Relative: 16 % — ABNORMAL HIGH (ref 3–12)
Neutro Abs: 2.2 10*3/uL (ref 1.7–7.7)
Neutro Abs: 1.8 10*3/uL (ref 1.7–7.7)
Neutrophils Relative %: 58 % (ref 43–77)
Neutrophils Relative %: 52 % (ref 43–77)

## 2010-06-11 LAB — BASIC METABOLIC PANEL
CO2: 28 mEq/L (ref 19–32)
Calcium: 8.5 mg/dL (ref 8.4–10.5)
GFR calc Af Amer: 7 mL/min — ABNORMAL LOW (ref >60)
Sodium: 128 mEq/L — ABNORMAL LOW (ref 135–145)

## 2010-06-11 LAB — CULTURE, BLOOD (ROUTINE X 2): Culture: NO GROWTH

## 2010-06-11 LAB — POCT CARDIAC MARKERS
CKMB, poc: 2.6 ng/mL (ref 1.0–8.0)
Myoglobin, poc: >500 ng/mL (ref 12–200)
Troponin i, poc: <0.05 ng/mL (ref 0.00–0.09)

## 2010-06-11 LAB — APTT: aPTT: 36 seconds (ref 24–37)

## 2010-06-11 LAB — PROTIME-INR: INR: 1.3 (ref 0.00–1.49)

## 2010-06-11 LAB — PTH, INTACT AND CALCIUM: Calcium, Total (PTH): 7.9 mg/dL — ABNORMAL LOW (ref 8.4–10.5)

## 2010-06-11 LAB — RHEUMATOID FACTOR: Rhuematoid fact SerPl-aCnc: 37 IU/mL — ABNORMAL HIGH (ref 0–20)

## 2010-06-11 MED ORDER — LAMIVUDINE 100 MG PO TABS: 50.0000 mg | ORAL_TABLET | Freq: Every day | ORAL | Status: DC

## 2010-06-11 NOTE — Assessment & Plan Note (Signed)
She is taking meds well, at SNF. Offered condoms. HIV pathogenesis explained to her. WIll see her back in 3-4 months with labs done by her [facility] 1-2 weeks prior. Will defer to her HD MDs regarding her nausea rx.

## 2010-06-11 NOTE — Progress Notes (Signed)
  Subjective:    Patient ID: [name], female    DOB: June 19, 1950, 60 y.o.   MRN: [id number]  HPI 60 yo F with HIV+ since 7-04, Pancreatitis, ESRD, prev ETOHism. She is currently living in SNF. Currently taking ISN/3TC/AZT. Denies missed. Some she has to take crushed in applesauce.  C/o feeling nauseated when she has HD (before and after). Has been given some med at HD for this but she does not know what this is.  States she has blood work done at [facility]. Had it done yesterday. Last CD4 was <200 (2011).   Review of Systems No sx of HTN per pt- doe shave occas HA upon further questioning, no chest pain. NL BM. Makes small amt of urine. Has had some bleeding from HD site.     Objective:   Physical Exam  Constitutional: She appears well-developed and well-nourished.  Eyes: EOM are normal. Pupils are equal, round, and reactive to light.  Neck: Neck supple.  Cardiovascular: Normal rate.   Murmur heard. Pulmonary/Chest: Effort normal and breath sounds normal.  Abdominal: Soft. Bowel sounds are normal. She exhibits no distension.  Musculoskeletal:       Arms:         Assessment & Plan:

## 2010-06-12 LAB — HEPATIC FUNCTION PANEL
ALT: 13 U/L (ref 0–35)
Indirect Bilirubin: 0.4 mg/dL (ref 0.3–0.9)
Total Protein: 12 g/dL — ABNORMAL HIGH (ref 6.0–8.3)

## 2010-06-12 LAB — POCT CARDIAC MARKERS
CKMB, poc: 1.9 ng/mL (ref 1.0–8.0)
Troponin i, poc: <0.05 ng/mL (ref 0.00–0.09)

## 2010-06-12 LAB — DIFFERENTIAL
Lymphocytes Relative: 33 % (ref 12–46)
Monocytes Absolute: 0.3 10*3/uL (ref 0.1–1.0)
Monocytes Relative: 9 % (ref 3–12)
Neutro Abs: 2 10*3/uL (ref 1.7–7.7)

## 2010-06-12 LAB — BASIC METABOLIC PANEL
BUN: 38 mg/dL — ABNORMAL HIGH (ref 6–23)
CO2: 23 mEq/L (ref 19–32)
Chloride: 100 mEq/L (ref 96–112)
Glucose, Bld: 84 mg/dL (ref 70–99)
Potassium: 4.3 mEq/L (ref 3.5–5.1)

## 2010-06-12 LAB — CBC
Hemoglobin: 10.6 g/dL — ABNORMAL LOW (ref 12.0–15.0)
RBC: 3.99 MIL/uL (ref 3.87–5.11)

## 2010-06-12 LAB — LIPASE, BLOOD: Lipase: 55 U/L (ref 11–59)

## 2010-06-16 LAB — DIFFERENTIAL
Basophils Relative: 1 % (ref 0–1)
Eosinophils Absolute: 0.1 10*3/uL (ref 0.0–0.7)
Monocytes Relative: 18 % — ABNORMAL HIGH (ref 3–12)
Neutrophils Relative %: 38 % — ABNORMAL LOW (ref 43–77)

## 2010-06-16 LAB — T-HELPER CELL (CD4) - (RCID CLINIC ONLY): CD4 % Helper T Cell: 21 % — ABNORMAL LOW (ref 33–55)

## 2010-06-16 LAB — CBC
MCHC: 31.8 g/dL (ref 30.0–36.0)
MCV: 80.8 fL (ref 78.0–100.0)
Platelets: 66 10*3/uL — ABNORMAL LOW (ref 150–400)

## 2010-06-16 LAB — BONE MARROW EXAM

## 2010-06-17 LAB — CBC
HCT: 38.5 % (ref 36.0–46.0)
Hemoglobin: 12.6 g/dL (ref 12.0–15.0)
MCHC: 32.6 g/dL (ref 30.0–36.0)
MCV: 80.4 fL (ref 78.0–100.0)
RBC: 4.79 MIL/uL (ref 3.87–5.11)
RDW: 17.4 % — ABNORMAL HIGH (ref 11.5–15.5)
WBC: 3.5 10*3/uL — ABNORMAL LOW (ref 4.0–10.5)

## 2010-06-17 LAB — BASIC METABOLIC PANEL
BUN: 51 mg/dL — ABNORMAL HIGH (ref 6–23)
CO2: 24 mEq/L (ref 19–32)
Calcium: 9.5 mg/dL (ref 8.4–10.5)
Chloride: 103 mEq/L (ref 96–112)
Creatinine, Ser: 12.67 mg/dL — ABNORMAL HIGH (ref 0.4–1.2)
GFR calc Af Amer: 4 mL/min — ABNORMAL LOW (ref >60)
GFR calc non Af Amer: 3 mL/min — ABNORMAL LOW (ref >60)
Glucose, Bld: 90 mg/dL (ref 70–99)
Potassium: 4.8 mEq/L (ref 3.5–5.1)
Sodium: 132 mEq/L — ABNORMAL LOW (ref 135–145)

## 2010-06-17 LAB — DIFFERENTIAL
Basophils Absolute: 0 10*3/uL (ref 0.0–0.1)
Basophils Relative: 0 % (ref 0–1)
Eosinophils Absolute: 0.1 10*3/uL (ref 0.0–0.7)
Eosinophils Relative: 2 % (ref 0–5)
Lymphocytes Relative: 40 % (ref 12–46)
Lymphs Abs: 1.4 10*3/uL (ref 0.7–4.0)
Monocytes Absolute: 0.5 10*3/uL (ref 0.1–1.0)
Monocytes Relative: 14 % — ABNORMAL HIGH (ref 3–12)
Neutro Abs: 1.6 10*3/uL — ABNORMAL LOW (ref 1.7–7.7)
Neutrophils Relative %: 44 % (ref 43–77)

## 2010-07-15 NOTE — Discharge Summary (Signed)
[name], [name]              ACCOUNT NO.:  [id number]   MEDICAL RECORD NO.:  [id number]          PATIENT TYPE:  INP   LOCATION:  A307                          FACILITY:  [facility]   PHYSICIAN:  Edward L. Luan Pulling, M.D.DATE OF BIRTH:  [birth year]   DATE OF ADMISSION:  12/03/2007  DATE OF DISCHARGE:  10/08/2009LH                               DISCHARGE SUMMARY   FINAL DISCHARGE DIAGNOSES:  1. Gastroenteritis.  2. Yeast in stool.  3. Hypertension.  4. Chronic renal failure.  5. Human immunodeficiency virus positive status with full-blown      acquired immune deficiency syndrome.  6. Gastroesophageal reflux disease.  7. Hyponatremia.   HISTORY:  [name] is a 60 year old with a long known history of  multiple medical problems including chronic renal failure, HIV disease,  hypertension, and gastroesophageal reflux disease.  She has come to the  emergency room because she had increasing problems, nausea, vomiting,  and diarrhea.  She has tried medications, it did not help.  She has had  some cramping, but she has not had any fevers or chills.   Her physical exam shows that she was awake and alert complaining of  abdominal discomfort.  Her chest was clear without wheezes.  Her abdomen  was soft.  No masses felt.  She was mildly tender diffusely.  Bowel  sounds were hyperactive.   Her lipase was 52 which is normal.  Chest x-ray showed cardiomegaly.  Sodium was 127.  BUN of 28, creatinine 7.39.  EKG showed ST-T wave  changes without other changes.   HOSPITAL COURSE:  She was started on IV medications and given fluids.  She had consultation with the Nephrology team and had dialysis twice  while she was hospitalized.  By the time of discharge, she was much  improved, felt well, was ready to go home.  She is going to be home with  her regular medications.  She had actually been taking minoxidil which  has been stopped now.  She had been taking Norvasc which had been  stopped.  Her  other medications are Catapres-TTS-3 weekly, Kaletra, and  HIV drugs, she is not certain of the does, Aciphex 20 mg b.i.d.,  labetalol [id number] mg t.i.d., folic acid 1 mg daily, Epivir the dose unknown,  and she has been on albuterol inhaler.  She takes Renagel 4-5 times a  day depending on how she is doing.      Edward L. Luan Pulling, M.D.  Electronically Signed    ELH/MEDQ  D:  12/08/2007  T:  12/09/2007  Job:  [id number]

## 2010-07-15 NOTE — H&P (Signed)
[name], [name]              ACCOUNT NO.:  [id number]   MEDICAL RECORD NO.:  [id number]          PATIENT TYPE:  AMB   LOCATION:  DAY                           FACILITY:  [facility]   PHYSICIAN:  Jamesetta So, M.D.  DATE OF BIRTH:  02-17-1951   DATE OF ADMISSION:  DATE OF DISCHARGE:  LH                              HISTORY & PHYSICAL   CHIEF COMPLAINT:  Incisional hernia, ventral hernia.   HISTORY OF PRESENT ILLNESS:  The patient is a 60 year old black female  who is referred for evaluation and treatment of two abdominal wall  hernias.  She has a periumbilical hernia from a previous lower midline  hysterectomy surgery as well as upper midline ventral hernia.  Both are  tender to touch and made worse with straining.   PAST MEDICAL HISTORY:  1. Hypertension.  2. Chronic renal failure.  3. Anemia.  4. Acquired immune deficiency syndrome.   PAST SURGICAL HISTORY:  Hysterectomy.   CURRENT MEDICATIONS:  1. Labetalol 300 mg p.o. b.i.d.  2. Renagel 800 mg 2 tablets with meals.  3. Minoxidil 2.5 mg 2 tablets b.i.d.  4. Nexium 40 mg p.o. daily.  5. Rena-Vite 1 tablet p.o. daily.  6. Zidovudine 2 mL p.o. q. Monday, Wednesday and Fridays after      hemodialysis.  7. Epivir 2.5 mg p.o. daily.  8. Albuterol nebs q.i.d. p.r.n.  9. Clonidine 0.3 mg patch weekly.  10.Kaletra 2 tablets p.o. b.i.d.   ALLERGIES:  No known drug allergies.   REVIEW OF SYSTEMS:  The patient does smoke.  She denies any recent  alcohol use.  She denies any recent illicit drug use.   PHYSICAL EXAMINATION:  GENERAL APPEARANCE:  The patient is a well-  developed, well-nourished black female in no acute distress.  LUNGS:  Clear to auscultation with equal breath sounds bilaterally.  HEART:  Examination reveals a regular rate and rhythm without S3, S4, or  murmurs.  ABDOMEN:  The abdomen is soft and nondistended.  An upper midline  ventral hernia is present and reducible as well as a periumbilical  incisional  hernia is present and reducible.  No hepatosplenomegaly or  masses are noted.   IMPRESSION:  1. Incisional hernia.  2. Ventral hernia.  3. Acquired immune deficiency syndrome.  4. Endstage renal disease.   PLAN:  The patient is scheduled for incisional herniorrhaphy with mesh,  ventral herniorrhaphy with mesh on December 07, 2006.  The risks and  benefits of the procedure including bleeding, infection, and the  possibly recurrence of the hernia were fully explained to the patient,  who gave informed consent.      Jamesetta So, M.D.  Electronically Signed     MAJ/MEDQ  D:  11/25/2006  T:  11/26/2006  Job:  [id number]   cc:   Jamesetta So, M.D.  Fax: [facility]  Fax: [phone]   Tesfaye D. Legrand Rams, MD  [phone]   Alison Murray, M.D.  Fax: [phone]

## 2010-07-15 NOTE — Group Therapy Note (Signed)
[name], [name]              ACCOUNT NO.:  [id number]   MEDICAL RECORD NO.:  [id number]          PATIENT TYPE:  INP   LOCATION:  A307                          FACILITY:  [facility]   PHYSICIAN:  Edward L. Luan Pulling, M.D.DATE OF BIRTH:  March 20, 1950   DATE OF PROCEDURE:  DATE OF DISCHARGE:                                 PROGRESS NOTE   [name] vomited last night and again this morning and she states  she is very tired from her dialysis.   PHYSICAL EXAMINATION:  VITALS:  Her vitals are not available because of  the computer being down, but she looks better.  She looks more  comfortable.  CHEST:  Clearer than it was.  HEART:  Regular.  ABDOMEN:  Actually fairly soft.  EXTREMITIES:  No edema.   ASSESSMENT:  She has abdominal discomfort.  She has had nausea,  vomiting, and diarrhea.  She has chronic renal failure, which is stable.  She is HIV positive.  She does have yeast in her stool, so I have put  her on Diflucan.  She has hypertension and her pressure is much better  controlled.  We are actually being able to back off on her medication to  some extend.  I am going to plan to have her continue with her treatment  and followup.  I think she will need to stay today, she will have her  dialysis tomorrow, and then perhaps she could be discharged after  dialysis tomorrow depending on how she does.      Edward L. Luan Pulling, M.D.  Electronically Signed     ELH/MEDQ  D:  12/06/2007  T:  12/07/2007  Job:  [id number]

## 2010-07-15 NOTE — H&P (Signed)
[name], [name]              ACCOUNT NO.:  [id number]   MEDICAL RECORD NO.:  [id number]          PATIENT TYPE:  INP   LOCATION:  [id number]                          FACILITY:  [facility]   PHYSICIAN:  Unk Lightning, MDDATE OF BIRTH:  27-Dec-1950   DATE OF ADMISSION:  12/11/2007  DATE OF DISCHARGE:  LH                              HISTORY & PHYSICAL   The patient is a 60 year old black female, HIV positive patient of Dr.  Luan Pulling, who was admitted to the hospital here 8 days ago, discharged,  and essentially had 1-day history of nausea, vomiting, and green bilious  emesis.  She denies hematemesis, hematochezia, or melena.  She has  profuse diarrhea up to 20-30 episodes per day and clinically appears  volume depleted with dry parched mucosa on her tongue, was admitted for  volume depletion and gastroenteritis.  She denies any anginal pain,  orthopnea, or PND.  She does have a cough, which is nonproductive, and  some dyspnea on exertion.   PAST MEDICAL HISTORY:  Significant for end-stage renal disease, on  dialysis, hypertension, and HIV positivity.   ALLERGIES:  She is allergic to PENICILLIN, which gives her a rash.   CURRENT MEDICATIONS:  1. Catapres-TTS 3 once a week.  2. Kaletra 2 tabs b.i.d.  3. Zidovudine 2 mL after dialysis Monday, Wednesday, and Friday.  4. Nexium 40 mg per day.  5. Minoxidil 5 mg b.i.d.  6. Labetalol 300 mg b.i.d.  7. Folic acid 1 mg once a day.  8. Albuterol inhaler 2 puffs q.i.d.  9. Epivir 10 mg p.o. daily.  10.Hydralazine 25 mg p.o. t.i.d.  11.Norvasc 10 mg per day.  12.Renagel oral 1 tablet t.i.d.   PHYSICAL EXAMINATION:  VITAL SIGNS:  Blood pressure is 164/98,  respiratory rate is 20, currently afebrile, O2 sat is 96%.  HEENT:  Eyes, PERRLA.  Extraocular movements intact.  Sclerae clear.  Conjunctivae pink.  Tortuous dry parched mucosa of the tongue.  No  exudates present.  LUNGS:  No prolonged expiratory phase.  Scattered rhonchi  throughout all  fields.  No wheeze.  No rales audible.  HEART:  Regular rhythm, 1/6 aortic outflow murmur.  No S3, S4, or  gallops.  No heaves, thrills, or rubs.  ABDOMEN:  Soft and nontender. Bowel sounds are normoactive.  No  peristaltic waves.  No guarding, no rebound.  EXTREMITIES:  No clubbing, cyanosis, or edema.   IMPRESSION:  1. Recurrent severe gastroenteritis.  2. Intravascular volume depletion.  3. Uncontrolled accelerated hypertension.  4. End-stage renal disease, on dialysis.  5. Human immunodeficiency virus positivity.  6. Hyponatremia, sodium of 129.   PLAN:  The plan is to admit, give D5 normal saline, monitor electrolytes  and renal function, get renal consult, given Zofran 4 mg IV p.r.n. for  nausea, Lomotil tabs 1 q.4 h. p.r.n. for diarrhea, assess clinical  status, and we will make further recommendations as the database  expands.      Unk Lightning, MD  Electronically Signed     RMD/MEDQ  D:  12/11/2007  T:  12/12/2007  Job:  [id number]

## 2010-07-15 NOTE — Consult Note (Signed)
[name], [name]              ACCOUNT NO.:  [id number]   MEDICAL RECORD NO.:  [id number]          PATIENT TYPE:  INP   LOCATION:  A316                          FACILITY:  [facility]   PHYSICIAN:  R. Garfield Cornea, M.D. DATE OF BIRTH:  02-Mar-1951   DATE OF CONSULTATION:  DATE OF DISCHARGE:                                 CONSULTATION   REFERRING PHYSICIAN:  Edward L. Luan Pulling, M.D.   CHIEF COMPLAINT:  Nausea and vomiting, diarrhea and refractory GERD.   HISTORY OF PRESENT ILLNESS:  [name] is a 60 year old African  American female.  She was admitted earlier this month around October 4  for similar symptoms.  She more recently has had fever.  She tells me  she has had shortness of breath and cough.  She tells me I am having  problems with my hiatal hernia.  She complains of early satiety and  always feeling full.  She complains of nausea and vomiting about twice a  day.  She tells me she cannot tell me whether she has lost or gained  weight.  She complains of a mid abdominal pain which is constant.  She  has had fecal incontinence with loose diarrheal stools, but cannot  quantify how much per day.  She tells me even if she coughs she is  incontinent.  She rates her abdominal pain 2 or more out of 10 on pain  scale.  She denies any rectal bleeding or melena.  She has had  refractory heartburn and indigestion.  She was previously treated  supportively for gastroenteritis.   The October 4 stool culture was negative, C. diff negative.  She was  found to have yeast in her stool and was treated with Diflucan.  Her  last CD-4 count was 300 back in September.   PAST MEDICAL-SURGICAL HISTORY:  1. Hypertension.  2. HIV positive.  3. CHF.  4. End-stage renal disease.  5. Questionable history of pancreatitis.   MEDICATIONS PRIOR TO ADMISSION:  1. Renagel t.i.d.  2. Norvasc 10 mg daily.  3. Catapres TTS 3 once weekly.  4. Kaletra 2 tablets b.i.d.  5. Zidovudine 2 mL after dialysis  Monday, Wednesday, Friday.  6. Nexium 40 mg daily.  7. Minoxidil 5 mg b.i.d.  8. Labetalol 300 mg b.i.d.  9. Folic acid 1 mg daily.  10.Albuterol inhaler 2 puffs q.i.d.  11.Epivir 10 mg daily.  12.Hydralazine 25 mg t.i.d.   ALLERGIES:  PENICILLIN CAUSES HIVES.   FAMILY HISTORY:  The patient is unable to report to me.   SOCIAL HISTORY:  [name] tells me she is a widow.  She lives alone.  She has one daughter who lives in [place].  She does have a history of  tobacco use.  She quit.  She does have a history of heavy alcohol use,  but does not drink currently.  Denies any drug use.   REVIEW OF SYSTEMS:  See HPI otherwise, negative.   PHYSICAL EXAMINATION:  VITAL SIGNS:  Temperature 92, pulse 82,  respirations 22, blood pressure 100/70, O2 SAT 93% on room air.  GENERAL:  Ms.  [name] is a chronically ill appearing African American  female who is alert, oriented, pleasant and cooperative.  She does have  somewhat labored respirations, but is in no acute distress.  SKIN:  With papular erythematous rash.  HEENT:  She does have eyelid edema and facial edema.  Her sclerae are  clear, nonicteric.  Conjunctiva pink.  Oropharynx pink and moist without  any lesions.  NECK:  Supple without any mass or thyromegaly.  CHEST:  Heart regular rate and rhythm.  Normal S1 and S2 without  murmurs, clicks, rubs or gallops.  LUNGS:  With decreased breath sounds bilaterally.  She does have labored  respirations.  No acute distress.  ABDOMEN:  Significantly distended.  She does have trace positive bowel  sounds in all four quadrants.  She has a easily reducible umbilical  hernia.  There is no rebound tenderness or guarding.  Her abdomen is  distended and firm.  Unable to palpate hepatosplenomegaly or mass given  significant distention.  She does have lower extremity edema bilaterally.   LABORATORY STUDIES:  Hemoglobin 12.4, hematocrit 39, platelet 66.  WBC  4.2.  Calcium 7.7, sodium 131,  potassium 3.9, chloride 102, CO2 of 23,  BUN 34, creatinine 8.66 and glucose 107.   IMPRESSION:  [name] is a 42-year African American female with  almost a 2-week history of nausea, vomiting, diarrhea and abdominal  pain.  She has history of chronic kidney disease end-stage on dialysis,  human immunodeficiency virus and gastroesophageal reflux disease.  She  has significantly distended abdomen, refractory gastroesophageal reflux  disease, mid abdominal pain, nausea, vomiting and diarrhea.  Differentials are broad including Clostridium difficile or  pseudomembranous colitis, small bowel obstruction, pancreatitis, peptic  ulcer disease, refractory gastroesophageal reflux disease,  erosive/herpes esophagitis, Candida esophagitis or gallbladder disease.   I have discussed this case with R. Garfield Cornea, M.D. and our plan of  care is detailed below.   PLAN:  1. CT of abdomen and pelvis with IV contrast STAT and start dialysis      post CT.  I have spoken with Kendall Flack, RN.  2. LFTs and lipase now.  3. Agree with PPI.  4. C. diff.  5. If CT is negative would proceed with flex sig, EGD as the next      step.   We would to thank Percell Miller L. Luan Pulling, M.D. for allowing [place] to participate  in the care of [name].      Vickey Huger, N.P.      [name], M.D.  Electronically Signed    KJ/MEDQ  D:  12/12/2007  T:  12/12/2007  Job:  [id number]

## 2010-07-15 NOTE — Consult Note (Signed)
NAMETABRESHA, RAINES              ACCOUNT NO.:  [id number]   MEDICAL RECORD NO.:  [id number]          PATIENT TYPE:  INP   LOCATION:  A313                          FACILITY:  [facility]   PHYSICIAN:  Alison Murray, M.D.DATE OF BIRTH:  1950-11-26   DATE OF CONSULTATION:  12/03/2006  DATE OF DISCHARGE:                                 CONSULTATION   REASON FOR CONSULTATION:  End-stage renal disease and also possible CHF.   [name] is a 60 year old Caucasian female with history of end-  stage renal disease on maintenance hemodialysis, history of also HIV  positive and hypertension who presently came with complaints of  weakness, some nausea, and shortness of breath.  Since the patient has  history of a hernia, she was sent for workup.  However, when she was in  the emergency room, she started complaining of cough which she has had  for some time and also history of weakness.  An x-ray was done.  The  patient was found to have sign of CHF.  Hence, admitted to the hospital.  Presently, still she is feeling weak.  She has some nausea, poor  appetite.   PAST MEDICAL HISTORY:  1. History of hypertension.  2. History of headaches.  3. History of end-stage renal disease on maintenance dialysis.  4. History of GERD.  5. History of depression.  6. History of generalized lymphadenopathy.  7. History of malnutrition.   MEDICATIONS:  1. Norvasc 10 mg p.o. daily.  2. Cefotan 250 mg IV t.i.d.  3. Catapres 0.3 mg patch per week.  4. Hydralazine 25 mg p.o. t.i.d.  5. Normodyne 300 mg p.o. t.i.d.  6. Kaletra 200 mg p.o. q.8h.  7. Lotensin 5 mg p.o. b.i.d.  8. Protonix 80 mg p.o. daily.  9. Atrovent inhaler.  10.Ambien 10 mg p.o. daily.  11.Zofran on a p.r.n. basis.  12.As an outpatient, also the patient is getting Epivir 10 mg p.o.      daily.  13  Zidovudine 2 mL after dialysis.   SOCIAL HISTORY:  No history of smoking, no history of alcohol abuse.   ALLERGIES:  SHE IS ALLERGIC TO  PENICILLIN.   REVIEW OF SYSTEMS:  Complains of weakness, also some shortness of  breath, also some nausea but no vomiting.  She has occasional also  diarrhea.  Her cough is associated with sputum, whitish.  Most of these  complaints have been there for a long period.  Recently she was feeling  better, but as stated above, the patient with similar complaints for  some time.   PHYSICAL EXAMINATION:  VITAL SIGNS:  Temperature is 96.9, pulse of 66,  blood pressure is 147/98, O2 saturation on 2 liters is 98%.  CHEST:  Decreased breath sounds.  Otherwise, no rales or rhonchi.  She  has some wheezing.  HEART:  Regular rate and rhythm.  No murmur.  ABDOMEN:  Soft, positive bowel sounds.  EXTREMITIES:  She does not have any edema.  However, she seems to have  some fullness around her neck area which she has had for some time.  Overall, that remains  stable.   LABORATORY DATA:  White blood cell count 3.8, hemoglobin 11.8,  hematocrit 37.6, platelets 74.  Sodium 128, potassium 3.8, BUN is 21,  creatinine 6.7.  Her BNP is 2730.   Chest x-ray with cardiomegaly, some __________  also.   ASSESSMENT:  1. End-stage renal disease status post hemodialysis on Wednesday.  At      this moment, her BUN and creatinine are within acceptable range as      well as potassium.  However, the patient seems to have some sign of      fluid overload.  2. History of human immunodeficiency virus-positive.  The patient on      antiviral medication and being followed at infectious disease      clinic in [place].  She is on antiviral medication.  3. History of __________ .  At this moment, the x-ray showed no      obstruction.  However, as stated above, the patient with      longstanding history of occasional diarrhea and also nausea with      malnutrition, probably associated with acquired immune deficiency      syndrome (AIDS).  Since then, she was put on antiviral medications      and overall has felt better.   4. History of hypertension.  Blood pressure at this moment is in      reasonable range.  The problem with her at this moment is that the      patient forgets to take her medication.  At times also she does not      feel her blood pressure fluctuates but most of the time when she      takes her blood pressure it seems to be controlled reasonably good.  5. History of hypoalbuminemia.  A combination of poor nutrition      probably and also secondary to her renal failure and also because      of recurrent nausea, poor appetite, diarrhea.  Hence, she has a      component of malabsorption also.  6. History of gastroesophageal reflux disease.  She is on Protonix as      an outpatient.  7. History of depression.  Recently has improved.   RECOMMENDATIONS:  I will make arrangements for her to get dialysis.  I  will continue with all of her medications.  Will try to get __________  of 3 liters, and I will follow the patient.      Alison Murray, M.D.  Electronically Signed     BB/MEDQ  D:  12/03/2006  T:  12/03/2006  Job:  [id number]

## 2010-07-15 NOTE — H&P (Signed)
[name], [name]              ACCOUNT NO.:  [id number]   MEDICAL RECORD NO.:  [id number]          PATIENT TYPE:  INP   LOCATION:  A307                          FACILITY:  [facility]   PHYSICIAN:  Edward L. Luan Pulling, M.D.DATE OF BIRTH:  04-16-1950   DATE OF ADMISSION:  12/03/2007  DATE OF DISCHARGE:  LH                              HISTORY & PHYSICAL   REASON FOR ADMISSION:  Nausea, vomiting, and diarrhea.   HISTORY:  [name] is a 60 year old who has a long known history of  multiple medical problems including chronic renal failure, HIV disease,  hypertension, and gastroesophageal reflux disease, and she has come to  the emergency room because of increasing problems with nausea, vomiting,  and diarrhea.  She has tried medication that have not helped.  She has  had some cramping.  She has not had any fever or chills.  She states  that she was feeling bad when she had dialysis last.   PAST MEDICAL HISTORY:  Positive for hypertension and chronic renal  failure.  She is HIV positive, and she is on hemodialysis.   PAST SURGICAL HISTORY:  Surgically, she has a hemodialysis graft in the  left arm.   SOCIAL HISTORY:  She does not smoke.  She does not use any alcohol.  She  does not use any illicit drugs.   FAMILY HISTORY:  Is not known to be positive for any GI problems.  It is  positive for hypertension.   ALLERGIES:  She is allergic to PENICILLIN.   MEDICATIONS:  She is on a number of medications and unfortunately she  has not brought all of her meds with her, so we do not have doses of  everything, but she is on Catapres-TTS #3 weekly, Kaletra which is the  HIV drug and I did not know what the dose is, AcipHex 20 mg b.i.d.,  minoxidil 5 mg b.i.d., labetalol [id number] mg b.i.d., folic acid 1 mg daily,  albuterol inhaler as needed, and Epivir, the dose is unknown.  She has  apparently recently stopped the hydralazine and she has recently stopped  the Norvasc.  She also takes  Renagel.   REVIEW OF SYSTEMS:  Except as mentioned is negative.   PHYSICAL EXAMINATION:  GENERAL:  She is awake and alert, complaining of  abdominal discomfort.  HEENT:  Pupils are reactive to light and accommodation.  Nose and throat  are clear.  Her mucous membranes are moist.  NECK:  Supple.  CHEST:  Clear without wheezes, rhonchi, or rales.  HEART:  Regular without murmur, gallop, or rub.  ABDOMEN:  Soft.  No masses are felt.  She is mildly tender diffusely.  Bowel sounds are hyperactive.  EXTREMITIES:  No edema.  CENTRAL NERVOUS SYSTEM:  Grossly intact.   LABORATORY:  Lipase is 52, which is normal.  Chest x-ray shows  cardiomegaly.  Electrolytes show her sodium is 127, potassium 3.5, BUN  28, and creatinine 7.39.  The rest of her comp metabolic profile is  normal.  EKG shows ST-T wave changes.  No other changes.   ASSESSMENT:  She has nausea,  vomiting, and diarrhea, possibly a viral  illness, but it is not totally clear.  She is going to have stool  cultures.  She is going to be on IV fluids.  I have not started any  other treatments for this.  I have asked for consultation with the Renal  Team because of her renal failure and hemodialysis.  I will continue  with her regular medications and followup.      Edward L. Luan Pulling, M.D.  Electronically Signed     ELH/MEDQ  D:  12/04/2007  T:  12/05/2007  Job:  [id number]

## 2010-07-15 NOTE — Consult Note (Signed)
[name], [name]              ACCOUNT NO.:  [id number]   MEDICAL RECORD NO.:  [id number]          PATIENT TYPE:  INP   LOCATION:  A316                          FACILITY:  [facility]   PHYSICIAN:  Alison Murray, M.D.DATE OF BIRTH:  Jan 26, 1951   DATE OF CONSULTATION:  12/12/2007  DATE OF DISCHARGE:  12/15/2007                                 CONSULTATION   REASON FOR CONSULT:  End-stage renal disease.   [name] is a 60 year old African American with a past medical  history of AIDS, end-stage renal disease on maintenance hemodialysis,  Monday, Wednesday, and Friday.  She has the history of hypertension,  admitted about a week ago because of recurrent diarrhea, some nausea,  vomiting, and discharged home to come back with similar problem.  The  patient states that she has still diarrhea which seems to be getting  worse since she left the hospital.  She states that she has profuse  diarrhea more than 10 times.  She denies any fever, chills, or sweating.   PAST MEDICAL HISTORY:  As stated above.  The patient with:  1. History of AIDS.  2. History of end-stage renal disease on maintenance hemodialysis.  3. History of GERD.  4. History  of hypertension.  5. History of depression.  6. History of malnutrition.  7. She has previously malabsorption and recurrent diarrhea, but not as      bad as the one she is mentioning now.   MEDICATIONS:  1. Norvasc 10 mg p.o. daily.  2. Catapres 0.3 mg patch once a week.  3. She is getting IV fluids at 150 mL per hour.  4. Folic acid 1 mg p.o. daily.  5. Hydralazine 25 mg p.o. daily.  6. Normodyne 300 mg p.o. b.i.d.  7. Kaletra 2 mg p.o. daily.  8. Minoxidil 5 mg p.o. b.i.d.  9. Protonix 80 mg p.o. daily.  10.Renagel 800 mg p.o. t.i.d.  11.__________ Lomotil 1 tablet p.o. daily.   ALLERGIES:  She is allergic to PENICILLIN.   SOCIAL HISTORY:  No history of smoking.  No history of alcohol abuse.   REVIEW OF SYSTEMS:  Her main complaint  seems to be poor appetite,  diarrhea watery, no blood, and multiple times headache.  She has poor  appetite.  She does not have any vomiting, but she has some nausea and  complains of also feeling weak.  She denies any chest pain.  No swelling  of the leg.   PHYSICAL EXAMINATION:  VITAL SIGNS:  Her temperature is 98.2, pulse of  82, and blood pressure is 100/70.  CHEST:  Clear to auscultation.  HEART:  Regular rate and rhythm.  No murmur.  ABDOMEN:  Soft.  Positive bowel sounds.  EXTREMITIES:  No edema.   LABORATORY DATA:  Her white blood cell count is 4.2, hemoglobin 12.8,  and hematocrit 39.  Her sodium is 131, potassium is 3.2, BUN is 34,  creatinine is 8.6.   ASSESSMENT:  1. Renal insufficiency, end-stage.  She is on the __________ for      dialysis today.  BUN and creatinine was in acceptable  range.      Potassium normal.  She does not have any sign of fluid overload.  2. Diarrhea, a recurrent problem.  She was discharged from the      hospital recently.  She came back with similar problem.  Now,      etiology not clear.  Since she has history of acquired immune      deficiency syndrome, probably she might have a bacterial infection.  3. History of hypertension.  Blood pressure seems to be reasonably      controlled.  4. History of __________.  5. History of hyperphosphatemia.  She is on a binder.  6. History of gastroesophageal reflux disease.   RECOMMENDATIONS:  We will decrease her IV fluids to 60 mL per hour.  We  will hold minoxidil.  We will hold also hydralazine.  We will hold  Renagel.  We will continue the other treatment.  We will follow the  patient.  Probably, we will make arrangement for her to get dialysis  tomorrow.      Alison Murray, M.D.  Electronically Signed     BB/MEDQ  D:  12/12/2007  T:  12/13/2007  Job:  [id number]

## 2010-07-15 NOTE — Group Therapy Note (Signed)
[name], [name]              ACCOUNT NO.:  [id number]   MEDICAL RECORD NO.:  [id number]          PATIENT TYPE:  INP   LOCATION:  A316                          FACILITY:  [facility]   PHYSICIAN:  Edward L. Luan Pulling, M.D.DATE OF BIRTH:  1950-05-13   DATE OF PROCEDURE:  DATE OF DISCHARGE:                                 PROGRESS NOTE   Ms. Hlavaty says she is doing all the better.  She is not as nauseous.  She is able to eat a little bit.  She has no new complaints.  She is due  for dialysis today.   Exam shows a temperature of 97.9, pulse is 59, respirations 18, blood  pressure 100/65, O2 sats 97%.  Her chest is clear.  Her abdomen is  fairly soft.  She said she had a bowel movement this morning.  Her  echocardiogram shows some right ventricular dysfunction, left ventricle  looks pretty good.  The degree of right ventricular dysfunction did not  appear to be severe, so I am not sure why she has had so much fluid in  her abdomen.  I am going to discuss this with GI team.      Jasper Loser. Luan Pulling, M.D.  Electronically Signed     ELH/MEDQ  D:  12/14/2007  T:  12/14/2007  Job:  [id number]

## 2010-07-15 NOTE — H&P (Signed)
[name], [name]              ACCOUNT NO.:  [id number]   MEDICAL RECORD NO.:  [id number]          PATIENT TYPE:  OBV   LOCATION:  A313                          FACILITY:  [facility]   PHYSICIAN:  Paula Compton. Willey Blade, MD       DATE OF BIRTH:  [birth year]   DATE OF ADMISSION:  12/02/2006  DATE OF DISCHARGE:  LH                              HISTORY & PHYSICAL   CHIEF COMPLAINT:  Shortness of breath.   HISTORY OF PRESENT ILLNESS:  This patient is a 60 year old African  American female with end-stage renal disease and HIV who presented to  the emergency room with shortness of breath.  She had been coughing  yellow sputum.  She denied fever.  She had also vomited earlier in the  day.  She had developed a distended abdomen, but had had a bowel  movement while in the emergency room.  She was evaluated by the  emergency room physician who felt she had pneumonia.  Her chest x-ray,  though, interpreted by the radiologist, was consistent with volume  overload.   PAST MEDICAL HISTORY:  1. HIV positive.  2. End-stage renal disease, on dialysis.  3. Hypertension.  4. Hysterectomy.  5. GERD.   MEDICATIONS:  1. Labetalol 300 mg b.i.d.  2. Minoxidil 5 mg b.i.d.  3. Norvasc 10 mg daily.  4. Hydralazine 25 mg t.i.d.  5. Catapres TTS-3 patch weekly.  6. Kaletra 2 b.i.d.  7. Zidovudine, unknown dose.  8. Nexium 40 mg daily.  9. Folic acid daily.  10.Albuterol p.r.n.  11.Epivir, unknown dose.   ALLERGIES:  PENICILLIN, which causes a rash.   SOCIAL HISTORY:  She does not smoke now.  She denies alcohol or IV drug  use.   REVIEW OF SYSTEMS:  Her HIV is managed through [facility].  Dr. Lowanda Foster  is her nephrologist.  She is scheduled for dialysis tomorrow.  She has  not had recent problems with volume overload requiring pulling off extra  fluid at dialysis, she states.  She has had an abdominal wall hernia.   PHYSICAL EXAMINATION:  VITAL SIGNS:  Temperature 98.8, blood pressure  159/119, pulse 66,  respirations 26, oxygen saturation 98%. GENERAL:  Alert and minimally dyspneic.  HEENT: No scleral icterus.  The pharynx is moist.  There is no sign of  thrush.  NECK: She has bilateral JVD, no thyromegaly.  LUNGS:  Basilar rales, no wheezes.  HEART:  Regular, with no murmurs or gallops.  ABDOMEN:  Distended, tender in the epigastrium.  No palpable  organomegaly.  She has an easily reducible ventral hernia.  EXTREMITIES:  She has a shunt in the left arm, with a good thrill.  There is no peripheral edema.  No cyanosis or clubbing.  NEUROLOGIC:  No focal deficits.  SKIN:  Warm and dry.   LABORATORY DATA:  White count 3800, with 58 neutrophils, 29%  lymphocytes, 12% monocytes.  Hemoglobin 11.8, platelets 74,000.  Sodium  128, potassium 3.8, bicarbonate 29, glucose 89, BUN 21, creatinine 6.7,  calcium 7.  Chest x-ray reveals CHF.  Flat and upright abdominal films  reveal no evidence of obstruction.  She has a nonspecific bowel gas  pattern, and no sign of free air.   IMPRESSION:  1. Shortness of breath, with end-stage renal disease.  This appears to      be consistent with volume overload.  Dr. Lowanda Foster has been      consulted for continuation of dialysis.  2. Bronchitis, with recent purulent sputum production.  Treat with      Ceftin 250 b.i.d.  Will obtain a sputum culture.  3. End-stage renal disease.  4. Human immunodeficiency virus.  Her antiretroviral therapy doses are      uncertain and can be clarified by Dr. Legrand Rams.  5. Ventral hernia.  6. Thrombocytopenia.  7. Hyponatremia.  8. Hypertension.  9. Gastroesophageal reflux disease.      Paula Compton. Willey Blade, MD  Electronically Signed     ROF/MEDQ  D:  12/03/2006  T:  12/03/2006  Job:  [id number]

## 2010-07-15 NOTE — Assessment & Plan Note (Signed)
NAMEMarland Kitchen  Tina, [name]               CHART#:  [id number]   DATE:  02/16/2008                       DOB:  02-12-1951   HOSPITAL FOLLOWUP:  The patient has HIV and renal failure on  hemodialysis was in the hospital in October with nausea, vomiting,  abdominal pain, and diarrhea.  We saw her in consultation.  She had a  yeast in her stool and was treated with Diflucan.  She has apparently  been seen by Dr. Tressie Stalker as well.  I really have a very sketchy  information of the going zone since she was in the hospital.  We did  have elevated immunoglobulins levels of 7000 range, ordered by Dr. Stann Mainland  and sent to Dr. Tressie Stalker back in October.  She tells me she saw Dr.  Tressie Stalker yesterday and there is a problem with her breast and she was  told she may have a carcinoma.  Further studies are ongoing.  She has  not had any diarrhea, and actually, she has not had any abdominal pain,  nausea, vomiting, melena, or rectal bleeding.  She does have  gastroesophageal reflux disease and her symptoms are well controlled on  Nexium 40 mg orally daily.  I do not know if she has had any recent labs  done.  Biomarkers, hepatitis B and C previously negative.  Prior CT of  the abdomen and pelvis back in October demonstrated some enlargement of  liver and spleen with evidence of passive congestion and some ascites.  She had upper abdominal mesenteric adenopathy, some small bowel wall  thickening present as well as some sigmoid colon thickening, but as  stated above, she is currently having no lower GI tract symptoms.  No  colitis and no abdominal pain.  Nausea and vomiting has resolved.  She  apparently saw Dr. Luan Pulling recently and she is going to undergo a chest  CT in the near future.  She is followed closely by Dr. Lowanda Foster who is  her nephrologist.  She gets dialysis on Monday, Wednesday, and Friday.   CURRENT MEDICATIONS:  See updated list.   ALLERGIES:  Of penicillin.   She had a CD-4 count in the 300  range previously and contemplated doing  EGD and flexible sigmoidoscopy when she was hospitalized, but her GI  symptoms settled down.   PHYSICAL EXAMINATION:  GENERAL:  Today frail, chronically ill-appearing  60 year old lady.  VITAL SIGNS:  Weight 124, height 5 feet, temperature 97.6, BP 110/80,  and pulse 68.  HEENT:  No scleral icterus.  CHEST:  Lungs are clear to auscultation.  CARDIAC:  Regular rate and rhythm without murmur, gallop, or rub.  ABDOMEN:  Rotund.  Really no shifting, dullness, or fluid wave.  Liver  percusses 4 fingerbreadths below the right costal margin.  I believe  that he can barely block a spleen.  The abdomen is entirely nontender.  EXTREMITIES:  No edema.   ASSESSMENT:  Recent hospitalization characterized with nausea, vomiting,  diarrhea, and abdominal pain.  Yeast in her stool treated, currently  asymptomatic from gastrointestinal standpoint.  Hepatosplenomegaly on CT  scan.  A CD-4 count reported in the 300 range, which would make  opportunistic infections a little less likely at this point in time.  A  hepatosplenomegaly felt to be secondary to passive congestion in part.  I do not have  any recent labs on this nice lady.  She never had a  screening colonoscopy.   RECOMMENDATIONS:  Before embarking on any further evaluation, we would  like to gather records from Dr. Lowanda Foster and Dr. Tressie Stalker of either  thoughts on the patient's medical status, overall we will get those  records and review them in the interim.  I am uncertain as the  significance of  elevated immunoglobin as this may go with a HIV  infection.  She is to continue her Nexium 40 mg orally daily for  gastroesophageal reflux disease.  Until we schedule a followup in 6  months.       [name], M.D.  Electronically Signed     RMR/MEDQ  D:  02/16/2008  T:  02/16/2008  Job:  [id number]   cc:   Percell Miller L. Luan Pulling, M.D.  Gaston Islam. Tressie Stalker, MD

## 2010-07-15 NOTE — Discharge Summary (Signed)
[name], [name]              ACCOUNT NO.:  [id number]   MEDICAL RECORD NO.:  [id number]          PATIENT TYPE:  INP   LOCATION:  A313                          FACILITY:  [facility]   PHYSICIAN:  Tesfaye D. Legrand Rams, MD   DATE OF BIRTH:  11-10-1950   DATE OF ADMISSION:  12/03/2006  DATE OF DISCHARGE:  10/06/2008LH                               DISCHARGE SUMMARY   DISCHARGE DIAGNOSES:  1. Probably pneumonia.  2. End-stage renal failure on hemodialysis.  3. Hypertension.  4. Acquired immunodeficiency syndrome.  5. Gastroesophageal reflux disease.  6. History of depression disorder.   DISCHARGE MEDICATIONS:  1. Catapres patch 0.3 mg q. weekly.  2. Nexium 40 mg daily.  3. Minoxidil 5 mg p.o. b.i.d.  4. Labetalol 300 mg b.i.d.  5. Kaletra two tablets p.o. b.i.d.  6. Hydralazine 25 mg t.i.d.  7. Norvasc 10 mg daily.  8. Zidovudine 20 mL p.o. after each hemodialysis.  9. Augmentin 500 mg b.i.d. for 5 days.   DISPOSITION:  The patient was discharged to home in stable condition.   HOSPITAL COURSE:  This is a 60 year old female patient with a history of  multiple medical illnesses who was admitted due to shortness of breath  and cough.  She was treated as a possible case of pneumonia.  The  patient received her regular hemodialysis.  After she had received IV  antibiotics and hemodialysis, the patient gradually improved.  Her  antibiotics were changed to oral, and the patient was discharged to  continue her hemodialysis and regular treatment.      Tesfaye D. Legrand Rams, MD  Electronically Signed     TDF/MEDQ  D:  12/20/2006  T:  12/20/2006  Job:  [id number]

## 2010-07-15 NOTE — Group Therapy Note (Signed)
NAMEYOVANKA, KIRCHNER              ACCOUNT NO.:  [id number]   MEDICAL RECORD NO.:  [id number]          PATIENT TYPE:  INP   LOCATION:  A307                          FACILITY:  [facility]   PHYSICIAN:  Edward L. Luan Pulling, M.D.DATE OF BIRTH:  1951/01/31   DATE OF PROCEDURE:  DATE OF DISCHARGE:                                 PROGRESS NOTE   Ms. Escobedo is admitted with nausea, vomiting, and diarrhea.  She has  renal failure, and she is due for dialysis today.  She has been very  hypertensive.  Her blood pressure is better, so we have been able to cut  back on her meds.   PHYSICAL EXAMINATION:  VITAL SIGNS:  Recorded.  CHEST:  Quite clear.  HEART:  Regular.  ABDOMEN:  Soft.   ASSESSMENT:  She looks better.   PLAN:  Continue with her treatments of medications.  No changes today.  Get the dialysis done and then we will see when she might be able to get  home.      Edward L. Luan Pulling, M.D.  Electronically Signed     ELH/MEDQ  D:  12/07/2007  T:  12/08/2007  Job:  [id number]

## 2010-07-15 NOTE — Discharge Summary (Signed)
[name], [name]              ACCOUNT NO.:  [id number]   MEDICAL RECORD NO.:  [id number]          PATIENT TYPE:  INP   LOCATION:  [id number]                         FACILITY:  [facility]   PHYSICIAN:  Alison Murray, M.D.  DATE OF BIRTH:  1950-05-30   DATE OF ADMISSION:  06/12/2008  DATE OF DISCHARGE:                               DISCHARGE SUMMARY   STAT DISCHARGE SUMMARY   DATE OF DISCHARGE:  To be determined at time of dictation.   DISCHARGE DIAGNOSES:  1. Left lobe pneumonia.  2. Pulmonary hypertension.  3. End-stage renal disease.  4. Hypertension.  5. Human immunodeficiency virus.   DISCHARGE DIAGNOSES:  1. Sildenafil 20 mg p.o. t.i.d.  2. Isentress 400 mg p.o. b.i.d.  3. Zidovudine 300 mg p.o. nightly.  4. Diltiazem 120 mg p.o. daily.  5. Lexapro 20 mg p.o. daily.  6. Lamivudine 25 mg p.o. daily.  7. Zemplar 2.5 mcg IV 3 times weekly with dialysis.  8. Nephro-Vite 1 tablet p.o. nightly.  9. Renagel 800 mg p.o. t.i.d. with meals.  10.Xanax 0.5 mg p.o. q.12 h p.r.n.   DISPOSITION/FOLLOW-UP:  The patient is being discharged from the [facility] teaching service on a date to be determined at time of dictation.  She will follow up with Dr. Aldona Bar on April 27th at 2:15 in the  afternoon.  In addition, she will need to follow up with her primary  care physician, Dr. Luan Pulling, date of that follow-up is pending at time  of dictation.   PROCEDURES PERFORMED:  1. Chest x-ray on June 12, 2008, shows new patchy opacity in the      periphery of the left mid lung may represent pneumonia,      cardiomegaly with mild pulmonary vascular congestion seen.  2. Hemodialysis 3 times weekly.  3. Two-D echocardiogram on June 14, 2008, which shows ejection      fraction 123456 to 123456, systolic function normal, increased pulmonary      artery systolic pressure estimated to be at 40 mmHg.   CONSULTATIONS:  Dr. Mercy Moore with [facility].   BRIEF ADMITTING HISTORY AND  PHYSICAL:  The patient is a 60 year old  African American female with history of HIV and end-stage renal disease  who presents to the ED with severe lower extremity pain bilaterally.  The patient states she has a chronic lower leg pain which is worse after  hemodialysis and after walking long distances.  She states it is better  after taking pain medication.  She has had a history of cough for the  past week but denies any fevers, chills, diaphoresis, chest pain,  shortness of breath.  She stated that she called the emergency  department because her lower extremity pain was worse than normal.  Temperature 98.7, blood pressure 134/89, pulse 82, respirations 20,  oxygen saturation 96% on room air.  GENERAL:  The patient is in no acute chest.  EYES:  Pupils equal, round and reactive to light.  ENT:  Moist mucous membranes.  OROPHARYNX:  Clear.  NECK:  Supple.  No JVD.  RESPIRATION:  Clear to  auscultation bilaterally.  No wheezes or crackles  appreciated.  CARDIOVASCULAR:  Normal S1, loud S2, regular rate and rhythm.  GI:  No abdominal distention, nontender, positive bowel sounds.  EXTREMITIES no clubbing, cyanosis or edema.  LYMPH:  No lymphadenopathy.  NEURO:  Nonfocal.  PSYCH:  Is appropriate.   LABORATORY DATA:  Sodium 130, potassium 4.5, chloride 99, bicarbonate  27, BUN 32, creatinine 6.3, glucose 88, white count 3.4, hemoglobin 1.7,  platelets 80.   HOSPITAL COURSE:  1. Pneumonia.  The patient was admitted to the [facility] teaching      service for evaluation of her pneumonia.  Initial chest x-ray      showed probable left new airspace disease.  The patient was started      on Avelox IV at time of admission.  She remained afebrile      throughout her hospitalization, did not develop any type of      leukocytosis.  In addition, her oxygen saturations remained stable      and she did not have any worsening of her cough, shortness of      breath or any type of chest pain  throughout.  Her Avelox was      transitioned to p.o. and she will need to complete a 10-day course      of the Avelox, this will need to be completed at her facility.  2. Pulmonary hypertension.  The patient did have an echocardiogram      from October 2009 which mentioned increased pulmonary artery      pressures at approximately 60 mmHg.  The primary team decided with      the patient's loud S2 and palpable heave on physical exam to obtain      a repeat echocardiogram in order to evaluate the patient for      pulmonary hypertension.  Her pulmonary artery pressure was also      found to be elevated on this examination at 40 mmHg.  Hence, the      primary team started sildenafil 20 mg p.o.  After the patient      received one dose she was not orthostatic and this was titrated up      to 2 doses, which the patient tolerated well.  She will be sent      home on sildenafil 20 mg p.o. t.i.d. for management of her      pulmonary hypertension.  She will need a follow-up echocardiogram      in 4-6 weeks to determine if this medication has been appropriate.  3. Leg pains.  The patient has a history of leg pains and cramps      usually post dialysis, this is a chronic problem for her.  The      nephrologists have been hesitant at this point to prescribe any      medications to combat this cramping.  She tolerated all of her      dialysis regimens well during this hospitalization, did not have      any episodes of cramping throughout.  She will need to continue on      a Monday, Wednesday, Friday regimen of hemodialysis as an      outpatient.  Her new dialysis center is pending at time of      dictation in that she will be likely placed in an assisted living      facility in [place], [place].  4. Hypertension.  The patient was continued on  her home medications      throughout and did not have any episodes of hypertension or      hypotension throughout.   DISPOSITION:  The patient had been  formally meeting with Candis Musa  in the [facility] outpatient clinic and working with her on finding an  assisted-living facility.  The patient received approximately 40 hours  of care per week from personal care services.  Her daughter is the  individual who provides the personal care services for her and she is  also compensated for this; however, she stated that the 40 hours a week  which she is paid is not enough.  The primary team consulted social work  and they determined the patient would qualify for an assisted living  facility placement.  She had 2 bed offers from the [place], [place]  area; however, they had been unable to pick a bed at time of dictation.  She will be discharged to an assisted living facility in stable and  improved condition.  The patient was medically stable at time of  dictation.   DISCHARGE VITAL SIGNS:  Temperature 97.4, blood pressure 118/78, pulse  87, respirations 24, saturating 96% on room air.   DISCHARGE LABORATORY DATA:  White blood cell count 3.2, hemoglobin 11.2,  platelets 86.  Sodium 131, potassium 4.4, chloride 101, bicarbonate 26,  BUN 45, creatinine 10.1, glucose 88.   The patient was in stable and improved condition at time of dictation  and she will be transferred to an assisted living facility to be  determined at time of dictation.      Kathlynn Grate, MD  Electronically Signed      Alison Murray, M.D.  Electronically Signed    ZF/MEDQ  D:  06/18/2008  T:  06/18/2008  Job:  [id number]   cc:   Claiborne Billings L. Tomma Lightning, M.D.  Edward L. Luan Pulling, M.D.  Alison Murray, M.D.

## 2010-07-15 NOTE — Discharge Summary (Signed)
[name], [name]              ACCOUNT NO.:  [id number]   MEDICAL RECORD NO.:  [id number]          PATIENT TYPE:  INP   LOCATION:  A316                          FACILITY:  [facility]   PHYSICIAN:  Edward L. Luan Pulling, M.D.DATE OF BIRTH:  05/10/1950   DATE OF ADMISSION:  12/11/2007  DATE OF DISCHARGE:  10/15/2009LH                               DISCHARGE SUMMARY   DISCHARGE DIAGNOSES:  1. Nausea, vomiting, and diarrhea.  Etiology unknown.  2. Acquired immune deficiency syndrome.  3. Chronic renal failure on dialysis.  4. History of yeast in her stool.  5. Hypertension.   HISTORY:  [name] is a 60 year old who came to the emergency room  because of abdominal discomfort, nausea, vomiting and diarrhea.  She had  been hospitalized here several days before __________her symptoms  improved and she was discharged home.  When she got home, however after  several days she began having more problems.  Part of the problem I  think is that she is not very clear on what medications she has been on  and she had restarted taking minoxidil which had been discontinued and  Norvasc which had been discontinued.   PHYSICAL EXAM:  On admission showed that her abdomen was soft and  nontender.  Bowel sounds were normoactive.  She did not have any  abdominal masses.  CHEST:  Her chest was clear.  HEART:  Regular.  ABDOMEN:  Soft.   HOSPITAL COURSE:  She was treated with intravenous fluids.  She was  continued on her other medications with the exception of the Norvasc and  the minoxidil.  She improved.  She did have to have dialysis while she  was here.  Because she continued to have problems, I asked for  gastrointestinal consultation and it was felt that she probably had  human immunodeficiency virus infection-related diarrhea.  She did have  very high immunoglobulins and are working that up.  She is going to  remain on her previous medications with the exception of the minoxidil  and the Norvasc.   She is going to continue taking Diflucan 100 mg daily.  Her human immunodeficiency virus infection medications are going to be  by her infectious disease specialist.  She is going to continue on  dialysis three times a week.  She is going to go on to a liquid diet.  She has diarrhea.  She can use Imodium as needed if she has diarrhea  and she is going to follow up with Dr. Tressie Stalker for evaluation of the  elevated immunoglobulin, in the gastrointestinal office as needed for  diarrhea, with Dr. Lowanda Foster, her nephrologist for dialysis, in my office  for general care and with her intravenous physician for human  immunodeficiency virus infection care.      Edward L. Luan Pulling, M.D.  Electronically Signed     ELH/MEDQ  D:  12/15/2007  T:  12/15/2007  Job:  [id number]

## 2010-07-15 NOTE — Consult Note (Signed)
NAMEDISTINY, ZEGARELLI              ACCOUNT NO.:  [id number]   MEDICAL RECORD NO.:  [id number]          PATIENT TYPE:  INP   LOCATION:  A307                          FACILITY:  [facility]   PHYSICIAN:  Alison Murray, M.D.DATE OF BIRTH:  Dec 31, 1950   DATE OF CONSULTATION:  DATE OF DISCHARGE:                                 CONSULTATION   REASON FOR CONSULT:  End-stage renal disease.   ATTENDING DOCTOR:  Dr. Luan Pulling.   Ms. Drier is a 60 year old African American who has history of AIDS,  hypertension, end-stage renal disease on maintenance hemodialysis,  presently came for with complaints of diarrhea of 2-3 days duration.  Ms. Tonne has intermittent diarrhea related to her HIV positive and  possibly for an infection and also malabsorption for some time.  However, she states that the last couple of days, she started going  about 5-6 times a day and she has some abdominal pain.  Hence, decided  to come to the emergency room where she is admitted to the hospital.  She denies any nausea or vomiting.  No fevers, chills, or sweating.   PAST MEDICAL HISTORY:  The patient with history of AIDS; history of end-  stage renal disease on maintenance of hemodialysis, Monday, Wednesday,  and Friday; history of GERD; history of hypertension; history of  depression; and also a history of intermittent diarrhea and  malnutrition.   MEDICATIONS:  At this moment consist of;  1. Norvasc 10 mg p.o. daily.  2. Catapres 0.3 mg once a week.  3. She is also on Avapro 25 mg p.o. t.i.d.  4. Normodyne 300 mg p.o. b.i.d.  5. Lamivudine 100 mg p.o. daily.  6. Kaletra 200 mg p.o. daily.  7. Minoxidil 5 mg p.o. b.i.d.  8. Protonix 40 mg p.o. daily.  9. Renagel 1600 mg p.o. t.i.d. with each meal.  10.Retrovir 200 mg p.o. daily.  11.She is getting p.r.n. medication, Tylenol.  12.She is getting Imodium 2 mg on p.r.n. basis.  13.She is getting also Zofran and Ativan on a p.r.n. basis.  14.She is now also  getting IV fluids at 100 mL per hour.   ALLERGIES:  She is allergic to PENICILLIN.   SOCIAL HISTORY:  No history of smoking.  No history of alcohol abuse.   FAMILY HISTORY:  Noncontributory.   REVIEW OF SYSTEMS:  No nausea or vomiting.  No fevers, chills, or  sweating.  Mainly, she has complaints of diarrhea.  The number of  frequency has gone down.  She has even previous diarrhea usually between  2-3 times a day, presently double of that amount.  She described it has  watery, no blood.  She does not have any chest pain and no urgency or  frequency.   PHYSICAL EXAMINATION:  GENERAL:  The patient is alert.  She is on nasal  O2.  HEENT:  No conjunctival pallor, no icterus.  CHEST:  She has some expiratory crackles.  Chest is clear to  auscultation.  HEART:  Regular rate and rhythm.  No murmur.  ABDOMEN:  Soft, positive bowel sounds.  EXTREMITIES:  No edema.  VITAL SIGNS:  Her temperature is 97.4, blood pressure is 87/53, and  pulse of 52.   Her white blood cell count is 3.6, hemoglobin 11.28, hematocrit 36.7,  and platelet of 76.  Her sodium is 174, potassium 4.2, chloride of 102,  BUN is 44, and creatinine is 8.7.   ASSESSMENT:  1. Diarrhea, etiology not sure since she has acquired immune      deficiency syndrome at this moment, bacterial and viral etiology,      also need to be entertained.  Presently, she is feeling better.      She is on Imodium.  2. End-stage.  She is status post hemodialysis on Friday.  She is due      for today.  She seems to have some crackles, probably she may be      fluid overload, because she is getting also intravenous fluid.  3. History of acquired immune deficiency syndrome.  She is being      followed by [facility] and her medications      are adjusted with that.  4. History of hypertension.  Her blood pressure seems to be to rule      out today, possibly related to her medications.  She is getting      about 4  medications.  5. History of gastroesophageal reflux disease.  6. History of depression.  7. History of also malabsorption.   RECOMMENDATIONS:  We will hold her blood pressure medication, especially  the Minoxidil and hydralazine until her blood pressure comes up.  We  will discontinue her IV fluid.  We will make arrangements for the  patient to get dialysis.  We will continue Imodium but we may need to  send a stool for culture and stain, if it is not done before.      Alison Murray, M.D.  Electronically Signed     BB/MEDQ  D:  12/05/2007  T:  12/05/2007  Job:  [id number]

## 2010-07-15 NOTE — Consult Note (Signed)
[name], [name]              ACCOUNT NO.:  [id number]   MEDICAL RECORD NO.:  [id number]          PATIENT TYPE:  OBV   LOCATION:  [id number]                         FACILITY:  [facility]   PHYSICIAN:  Windy Kalata, M.D.DATE OF BIRTH:  February 02, 1951   DATE OF CONSULTATION:  06/13/2008  DATE OF DISCHARGE:                                 CONSULTATION   REFERRING PHYSICIAN:  Alison Murray, MD   REASON FOR CONSULTATION:  End-stage renal disease, anemia, secondary  hyperparathyroidism, and hypertension.   HISTORY OF PRESENT ILLNESS:  This is a 5-year black female with history  of end-stage renal disease on hemodialysis at [facility] in  [place] on Monday, Wednesday, Friday, who was admitted yesterday  because of pneumonia.  She does have a history of HIV, though does not  appear she has been taking any medicines for it.  All-in-all, the  patient is not a very good historian.  Appetite has been poor and she  has had a nonproductive cough.   PAST MEDICAL HISTORY:  1. History of alcohol and tobacco abuse.  2. HIV.  3. History of pancreatitis.  4. History of thrombocytopenia.  5. Hypertension.  6. Gastroesophageal reflux disease.  7. Chronic diarrhea.  8. End-stage renal disease.   ALLERGIES:  PENICILLIN.   MEDICATIONS:  1. Lexapro 20 mg a day.  2. Labetalol 200 mg b.i.d.  3. Renagel 400 mg two with meals.  4. Nexium 40 mg a day.  5. Minoxidil 5 mg b.i.d.  6. Rena-Vite one a day.  7. Zidovudine 2 mL after dialysis.   Her med list from her dialysis unit suggest she is taking more medicines  than she is actually taking.  She is supposed to be on Bactrim DS one a  day, Catapres patch #3 q. week, Kaletra, Epivir, Retrovir for HIV which  is not in her bag of medicines.   SOCIAL HISTORY:  She is an ex-smoker.  She has a history of alcohol  abuse, though she denies any alcohol use now.  She lives by herself in  [place].   FAMILY HISTORY:  Mother and father have died.   There is no family  history of renal disease.   REVIEW OF SYSTEMS:  Appetite is poor, occasional nausea, and cough as  noted above.  No chest pains.  No recent change in bowel habits.  She  continues to have chronic diarrhea.  No new arthritic complaints.  She  does complain of muscle cramping.  She relates this secondary to  dialysis.  No new neuropathic symptoms.  No dysuria.  Rest of the review  of systems is unremarkable.   PHYSICAL EXAMINATION:  VITAL SIGNS:  Blood pressure 101/69, pulse 69,  and temperature 96.9.  GENERAL:  A 60 year old black female in no acute distress.  HEENT:  Sclerae nonicteric.  Extraocular muscles are intact.  NECK:  No JVD.  No bruits.  LUNGS:  Few crackles on the right, decreased breath sounds throughout.  HEART:  Regular rate and rhythm without murmur, rub, or gallop.  ABDOMEN:  Positive bowel sounds, nontender, and nondistended.  Liver  palpable, 3-4+ below the costal margin.  EXTREMITIES:  No edema.  Left AV fistula positive bruit.  NEUROLOGIC:  Cranial nerves are intact.  Motor intact.  She is oriented  x3.  No asterixis.  Pulse are 2/4 in the carotids, radials, and femorals  and 1/4 in the dorsalis pedis and posterior tibial.   LABORATORY DATA:  Potassium is 5.3, bicarb 25, BUN 45, creatinine 7.7,  phosphorous 3.3, and calcium 8.5.  Hemoglobin 11.7 and white count 3.4.   IMPRESSION:  1. Left lung pneumonia.  2. Human immunodeficiency virus.  3. Anemia.  4. Secondary hyperparathyroidism.  5. End-stage renal disease.   PLAN:  We will perform hemodialysis today.  We will order Epogen and  Zemplar.  We will start her on Reno-Vite.  We will restart her Renagel  at 800 mg with meals.  It is not clear to me why she is on the digoxin  and Cardizem at this time.  All-in-all, I do not think she is taking all  medicines that she is supposed to be taking.      Windy Kalata, M.D.  Electronically Signed     MTM/MEDQ  D:  06/13/2008  T:   06/14/2008  Job:  [id number]

## 2010-07-15 NOTE — Group Therapy Note (Signed)
NAMEKATRIA, GENZER              ACCOUNT NO.:  [id number]   MEDICAL RECORD NO.:  [id number]          PATIENT TYPE:  INP   LOCATION:  A316                          FACILITY:  [facility]   PHYSICIAN:  Edward L. Luan Pulling, M.D.DATE OF BIRTH:  [birth year]   DATE OF PROCEDURE:  DATE OF DISCHARGE:                                 PROGRESS NOTE   Ms. Barrio is admitted yesterday with recurrence of nausea, vomiting,  and diarrhea.  She states that she is short of breath.  She has no other  new complaints.  She of course has a history of HIV disease, and history  of chronic renal failure, and severe hypertension.   PLAN:  For Renal consultation for dialysis, GI consultation for the  nausea, vomiting, and diarrhea.  I am going to cut her fluids back.  She  was dehydrated when she came in.  Her HIV medication doses are not clear  to me.  I do not prescribe her medicines, so I do not know what exactly,  how she takes them.  I am going to see what I can do about getting those  clarified with her drug store.      Edward L. Luan Pulling, M.D.  Electronically Signed     ELH/MEDQ  D:  12/12/2007  T:  12/12/2007  Job:  [id number]

## 2010-07-15 NOTE — Group Therapy Note (Signed)
[name], [name]              ACCOUNT NO.:  [id number]   MEDICAL RECORD NO.:  [id number]          PATIENT TYPE:  INP   LOCATION:  A307                          FACILITY:  [facility]   PHYSICIAN:  Edward L. Luan Pulling, M.D.DATE OF BIRTH:  1950-03-04   DATE OF PROCEDURE:  DATE OF DISCHARGE:  12/08/2007                                 PROGRESS NOTE   [name] was admitted with diarrhea, nausea, chronic renal failure.  She has positive HIV, and she was found to have yeast in her stool.  A  stool identification is not out yet, but she seems a great deal better  after using Diflucan.  She was dialyzed yesterday, and she says she  feels great and wants to go home.   My assessment then is that she is better, and since she is feeling well  I do plan to discharge her.  Please see discharge summary for details.      Edward L. Luan Pulling, M.D.  Electronically Signed     ELH/MEDQ  D:  12/08/2007  T:  12/09/2007  Job:  [id number]

## 2010-07-15 NOTE — Group Therapy Note (Signed)
NAMEERIANNE, LADAS              ACCOUNT NO.:  [id number]   MEDICAL RECORD NO.:  [id number]          PATIENT TYPE:  INP   LOCATION:  A307                          FACILITY:  [facility]   PHYSICIAN:  Edward L. Luan Pulling, M.D.DATE OF BIRTH:  Aug 27, 1950   DATE OF PROCEDURE:  12/05/2007  DATE OF DISCHARGE:                                 PROGRESS NOTE   HISTORY:  Ms. Kitzman was admitted with diarrhea and seems to be doing  a little better.  Since her diarrhea is not quite as bad but she is  still having it.  She has not had any fever or chills.  She is not  coughing.  She is not congested.   PHYSICAL EXAMINATION:  VITAL SIGNS:  Temperature is 97.1, pulse 54,  respirations 20, and blood pressure 90/55.   LABORATORY DATA:  White count is 3600, hemoglobin is 11.8, and platelets  76.  Sodium is 134, BUN 44, and creatinine 8.73.   She is set for dialysis today.   ASSESSMENT:  She is a little better.   PLAN:  To continue with her chronic treatments and medications and  follow her.  We are awaiting stool studies.      Edward L. Luan Pulling, M.D.  Electronically Signed     ELH/MEDQ  D:  12/05/2007  T:  12/05/2007  Job:  [id number]

## 2010-07-18 NOTE — Discharge Summary (Signed)
[name], [name]              ACCOUNT NO.:  [id number]   MEDICAL RECORD NO.:  [id number]          PATIENT TYPE:  INP   LOCATION:  A318                          FACILITY:  [facility]   PHYSICIAN:  Tina Mulders. Dechurch, M.D.DATE OF BIRTH:  11-21-1950   DATE OF ADMISSION:  09/25/2005  DATE OF DISCHARGE:  08/02/2007LH                                 DISCHARGE SUMMARY   DIAGNOSES:  1. Sepsis secondary to methicillin-resistant Staphylococcus aureus      bacteremia.  2. End-stage renal disease on maintenance hemodialysis.  3. Human immunodeficiency virus.  CD4:CD8 0.34.  4. Hyperproteinemia.  Total protein 10.3.  Further work-up not pursued      during the hospital stay.  5. History of thrombocytopenia.  6. History of alcohol abuse.  7. Hypertension, poorly controlled.  8. Hypoalbuminemia.   DISPOSITION:  The patient is discharged to home.  Followup with Dr. Legrand Rams in  7-10 days.  Hemodialysis per her usual schedule.   MEDICATIONS:  1. Catapres-TTS-3 q weekly.  2. Labetalol 400 mg b.i.d.  3. Bactrim one p.o. daily.  4. Nexium 40 mg daily.  5. Renagel 800 mg two tablets b.i.d. with meals.   HOSPITAL COURSE:  The patient is a 60 year old African-American female who  has been on maintenance dialysis since about November of 2006 who presented  to the emergency room with fever.  Apparently, she was at dialysis and had  fever.  She was given vancomycin and turned red in her upper chest and  neck and was brought to the emergency room for evaluation.  Her past  medical history was remarkable for HIV, though she was not on any  retrovirals.  She subsequently grew MRSA.  In the emergency room, her  temperature was 103 and peaked at 105.  She was lethargic.  She apparently  complained of some tenderness in her abdomen, but otherwise was  unremarkable.  Blood pressures were on the low side, though she was post  dialysis.  She was treated with IV fluids, Zofran, Bactrim and gentamicin.  She  continued vancomycin during her hospital stay.  She was thrombocytopenic  at the time of presentation, but this remained stable at 108 at discharge.  She had no bleeding issues during the hospital stay.  Hemoglobin was stable.  Interestingly, her initial total protein was 11.9 and 10.6 at the time of  discharge with an albumin of 1.9 and 1.7 respectively.  No further  evaluation of her hyperproteinemia was undertaken during this hospital stay,  but can be managed as an outpatient.  Hemoglobin was stable.  CD4 count  revealed a total lymphocyte count of 820 with a CD4 absolute of 170 and a  ratio of 0.34.  Viral loads were not obtained.  The patient clinically  improved.  She did have some nausea, vomiting and diarrhea initially, but  these, too, subsided.  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  She had no further problems during the hospital  stay with subsequent doses  of vancomycin.  She clinically improved, was  ambulating without difficulty and ready for discharge.  Her blood pressure  medicines were gradually re-introduced.  It should be noted that she was on  transdermal Catapres 0.3 at home, which will be resumed, and the labetalol  has been increased to 400 b.i.d. and is to be monitored as an outpatient.   PHYSICAL EXAMINATION AT DISCHARGE:  GENERAL:  At the time of discharge, she  is alert and appropriate, ready for discharge.  No complaints.  VITAL SIGNS:  Blood pressure 143/100, weight 130.5, temperature 97.2, heart  rate 67 and regular, respirations unlabored.  LUNGS:  Clear.  ABDOMEN:  Soft and nontender.  EXTREMITIES:  Without clubbing or cyanosis.  She has no edema.   An echocardiogram did not reveal any evidence of vegetation.   She is discharged to home with the plan as noted above.      [name], M.D.  Electronically Signed     FED/MEDQ   D:  10/01/2005  T:  10/01/2005  Job:  [id number]

## 2010-07-18 NOTE — Consult Note (Signed)
[name], [name]              ACCOUNT NO.:  [id number]   MEDICAL RECORD NO.:  [id number]          PATIENT TYPE:  INP   LOCATION:  A341                          FACILITY:  [facility]   PHYSICIAN:  Edward L. Luan Pulling, M.D.DATE OF BIRTH:  Nov 11, 1950   DATE OF CONSULTATION:  04/14/2006  DATE OF DISCHARGE:                                 CONSULTATION   HISTORY:  Ms. Hehir is a 60 year old who has a long known history of  multiple medical problems including hypertension, end-stage renal  disease on maintenance hemodialysis, acquired immune deficiency  syndrome/HIV positive, anemia.  She was admitted to the hospital on the  10th with nausea and vomiting.  She has had some blood in the vomitus,  had a temperature of 103 and what appeared to be a right lower lobe  infiltrate on chest x-ray.   MEDICAL HISTORY:  Her past medical history is positive for apparently  she has full-blown acquired immune deficiency syndrome on treatment, she  has end-stage renal disease on dialysis, she has hypertension.  Surgically, she has had a dialysis graft.  She lives with family.  She  does not smoke.  She does not use any alcohol.   MEDICATIONS INCLUDE:  1. Catapres-TTS-3 weekly.  2. Kaletra 2 tablets b.i.d.  3. Zidovudine (50 mg) 2 mL every other day.  4. Nexium 40 mg daily.  5. Minoxidil 5 mg b.i.d.  6. Labetalol 300 mg b.i.d.  7. Epivir (10 mg/mL) 2.5 mL daily.  8. Renagel 800 mg 2 tablets three times a day.  9. Albuterol nebulizer treatments.   OTHER MEDICAL HISTORY:  She had a previous history of pneumonia, history  of MRSA sepsis, history of thrombocytopenia, anemia, history of  hyponatremia, hypokalemia, hypoalbuminemia with pancreatitis in the past  and a lymph node dissection in the past.   FAMILY HISTORY:  Her mother is deceased at a young age of unknown cause.   REVIEW OF SYSTEMS:  Otherwise is pretty much negative.   PHYSICAL EXAMINATION:  VITAL SIGNS:  Physical exam now shows  temperature  is 99.4, pulse 72, respirations 22, blood pressure 100/60, O2 saturation  is 98% on 2 liters, height 67 inches, weight 121 (this on the 10th).  HEENT:  Her pupils are reactive.  Nose and throat are clear.  CHEST:  Chest is actually fairly clear with bilateral rhonchi.  Her  heart is regular without gallop.  ABDOMEN:  Soft.  No masses are felt.  EXTREMITIES:  No edema.  CNS:  Grossly intact.  She is somewhat sluggish.   STUDIES:  White blood count 4100, hemoglobin 9.9, platelets 87,000.  Sodium 126, creatinine 9.1, BUN 34.   Her chest x-ray on the 10th shows clear lobar pneumonia in the right  lower lobe but the chest x-ray today shows that this has in essence  cleared.  She had some right hilar enlargement.  She has some  peribronchial thickening but no pneumonia now.  A Pneumocystis titer on  the sputum was negative.  Acid-fast culture x1 negative.  Blood cultures  negative thus far.   ASSESSMENT AND PLAN:  CD-4 count on 8th of January 330 and on the 5th of  February 850 which makes it of course much more likely that what she is  having is more of a nonopportunistic infection but certainly not  impossible.  She is on Rocephin and Zithromax and has had remarkable  improvement by chest x-ray criteria, so I do not think I would change  anything now.   Plan then would be continue on these treatments, continue getting her  AFB smears, cultures, etc., and follow.      Edward L. Luan Pulling, M.D.  Electronically Signed     ELH/MEDQ  D:  04/14/2006  T:  04/14/2006  Job:  [id number]

## 2010-07-18 NOTE — Consult Note (Signed)
[name], [name]              ACCOUNT NO.:  [id number]   MEDICAL RECORD NO.:  [id number]          PATIENT TYPE:  INP   LOCATION:  A226                          FACILITY:  [facility]   PHYSICIAN:  Alison Murray, M.D.DATE OF BIRTH:  1950/05/09   DATE OF CONSULTATION:  DATE OF DISCHARGE:                                   CONSULTATION   NEPHROLOGY CONSULTATION   REASON FOR CONSULTATION:  End stage renal disease.   HISTORY OF PRESENT ILLNESS:  [name] is a 60 year old with past medical  history of hypertension, end stage renal disease on maintenance hemodialysis  with history of HIV, went to the dialysis unit yesterday and she was found  to have severe hypertension and also a fever with sweating.  Blood culture  was done. She was given vancomycin empirically after blood culture was done  and sent to the emergency room because of uncontrolled hypertension.  Ms.  Dudeck said that she was feeling okay until she went to dialysis.  She has  some diarrhea on and off  for some time and poor appetite but no nausea, no  vomiting.  She denies any cough.  Presently she is feeling better but she  feels weak.   PAST MEDICAL HISTORY:  As stated above.  The patient with history of  hypertension, history of end-stage renal disease maintained on hemodialysis,  history of HIV positivity, history of depression, history of chronic on and  off diarrhea.   MEDICATIONS:  Patient has received vancomycin one dose presently.  She is  also started on Bactrim.   CURRENT MEDICATIONS:  As an outpatient patient was supposed to be on  Labetalol 400 mg p.o. daily.  She was also on Minoxidil 5 mg p.o. b.i.d.,  however unfortunately, the patient did not get the medication from pharmacy,  hence she has not taken it and also she is on Clonidine 0.3 mg p.o. b.i.d.  She was supposed to be also on Norvasc.   ALLERGIES:  She is ALLERGIC to PENICILLIN.   SOCIAL HISTORY:  No history of smoking.  No history of  alcohol abuse.   REVIEW OF SYSTEMS:  No complaint except for some weakness, some sweating.  She does not have any fever.  She has some chills also.   PHYSICAL EXAMINATION:  VITAL SIGNS:  Her temperature is 98.1, pulse 68,  blood pressure 133/91.  HEENT:  No conjunctival pallor.  No icterus.  Oral mucosa seems to be dry.  NECK:  Supple, no jugular venous distention.  CHEST:  Clear to auscultation with no rales, rhonchi and no egophony.  HEART:  Regular rate and rhythm with no murmurs.  ABDOMEN:  Soft, positive bowel sounds.  EXTREMITIES:  No edema.   LABORATORY DATA:  Her white blood cell count is 13.9, her hemoglobin is  12.0, hematocrit is 36.4.  Sodium is 134, potassium 3.5, BUN 16, creatinine  5.3.  Calcium of 7.8.  Her amylase was 209.  Blood culture this morning is  pending.   ASSESSMENT:  1.  Fever with leukocytosis.  At this moment need to entertain the  possibility of infection.  Source is not clear.  A chest x-ray did not      show any infiltrate.  Also since the patient has a graft which looks      good, a source of fever and leukocytosis is not clear but since the      patient has been having some diarrhea, will need to entertain      gastroenteritis.  2.  History of hypertension.  Her blood pressure at this moment is      controlled but noncompliant with her medications because of different      reasons.  The patient was supposed to go on Norvasc and she is on      Catapres Patch, and also she was supposed to be on Minoxidil.  3.  History of human immunodeficiency virus positivity.  4.  History of anemia, hemoglobin and hematocrit stable.   RECOMMENDATIONS:  Will do stool for ova and parasites and also culture.  Will also do urine culture.  Will continue this present management and will  start her on Labetalol 400 mg p.o. b.i.d.  If her blood pressure goes above  140, will also put her on Minoxidil 5 mg p.o. b.i.d.  Will continue her  other medications and I will  follow the patient.      Alison Murray, M.D.  Electronically Signed     BB/MEDQ  D:  09/26/2005  T:  09/26/2005  Job:  [id number]

## 2010-07-18 NOTE — Consult Note (Signed)
[name], [name]              ACCOUNT NO.:  [id number]   MEDICAL RECORD NO.:  [id number]          PATIENT TYPE:  INP   LOCATION:  A341                          FACILITY:  [facility]   PHYSICIAN:  [name], Tina SerranoDATE OF BIRTH:  04-Jun-1950   DATE OF CONSULTATION:  04/12/2006  DATE OF DISCHARGE:                                 CONSULTATION   REASON FOR CONSULTATION:  End-stage renal disease for hemodialysis.   HISTORY OF PRESENT ILLNESS:  [name] is a 60 years old with past  medical history of hypertension, end-stage renal disease on maintenance  hemodialysis, history of HIV positive, anemia.  Presently came to the  emergency room with the complaints of nausea, vomiting, diarrhea and  also some shortness of breath.  During this time, she is admitted for  possible pneumonia.  [name] has diarrhea on and off for probably  more than 12-13 months.  However, she states that at this moment it is  becoming more frequent.  Presently, she is feeling better.  She denies  any fever, chills or sweating, but she complains of some weakness.   PAST MEDICAL HISTORY:  1. As stated above.  2. History of HIV positive.  3. History of hypertension.  4. History of generalized lymphadenopathy including __________ .  5. History of GERD.  6. History of depression.  7. History of malabsorption and recurrent diarrhea, noncompliant with      her medications.   MEDICATIONS:  At this moment, consist of:  1. Ventolin inhaler every 4 hours.  2. Zithromax 500 mg IV q.24 h.  3. Rocephin 1 gram IV q.24 h.  4. Catapres 0.3 patch per week.  5. Atrovent inhaler.  6. Normodyne 300 mg p.o. b.i.d.  7. __________ 25 mg p.o. daily.  8. Kaletra 2 mg n.p.o. q.8 h.  9. Minoxidil 5 mg p.o. b.i.d.  10.Protonix 80 mg p.o. daily.  11.RenaGel 800 mg tablet p.o. t.i.d. with each meal.  Presently      RenaGel is b.i.d., but she was getting it three times.  12.Xanax 0.25 mg p.o. daily.  13.Imodium 2 mg p.o.  on p.r.n. basis.  14.Other medications are p.r.n. medication.   ALLERGIES:  PENICILLIN.   SOCIAL HISTORY:  No history of recent smoking.  Previously she used to  smoke.  No history of illicit drug use.   REVIEW OF SYSTEMS:  Complains of weakness.  She had some diarrhea.  Appetite is not that great.  The patient denied vomiting.  She denies  any fever.  She complains of some weakness.  Her shortness of breath is  intermittent.  She does not have any orthopnea.   PHYSICAL EXAMINATION:  GENERAL:  The patient alert on nasal O2.  VITAL SIGNS:  Temperature is 98.7, blood pressure 128/67, pulse of 62.  CHEST:  She has some inspiratory crackles.  HEART:  Exam revealed regular rate and rhythm.  No murmur.  ABDOMEN:  Soft, positive bowel sounds.  EXTREMITIES:  No edema.   LABORATORY DATA:  Her white blood cell count is 7.9, hemoglobin 10.9,  hematocrit 34.2, platelets 106.  Sodium 113,  potassium 4, BUN 39,  creatinine is 10.2, albumin is 2.2 and calcium 8.6.  At this moment,  there is no additional blood work.  Blood culture, which was done during  her admission, is still pending.  Chest x-ray from yesterday showed new  right lower lobe infiltrate.   ASSESSMENT:  1. End-stage.  She is status post hemodialysis on Friday.  She is due      today.  BUN and creatinine within acceptable range.  No significant      sign of fluid overload.  Her potassium is also within acceptable      range.  2. History of pneumonia.  New infiltrate right side.  She is on      antibiotics.  She is afebrile.  Her white blood cell count is      normal.  3. Hypertension.  Blood pressure is controlled very well most of the      time when she is in dialysis because she missed some of the      medication.  Her blood pressure used to go up, but whenever she      comes to the hospital, even with fewer medication, her blood      pressure is usually controlled very well.  4. History of HIV.  Presently, she is started on  antiviral medication.      She has generalized lymphadenopathy.  Etiology not clear.  Probably      may be developing AIDS.  5. History of anemia.  She is on Epogen.  6. History of GERD.  She is on an she is on Protonix.  7. History of diarrhea, chronic, probably malabsorption at this      moment.  Infection, such as bacterial, cannot be ruled out.  8. History of depression.   RECOMMENDATIONS:  Will continue present management.  Will do the  dialysis today, and we will probably try to __________ about 2 liters.  Otherwise, will continue the other medications.  Will change her RenaGel  to 800 mg two tablets p.o. t.i.d. with each meal.      [name], M.D.  Electronically Signed     BB/MEDQ  D:  04/12/2006  T:  04/12/2006  Job:  [id number]

## 2010-07-18 NOTE — Consult Note (Signed)
NAMEPAYLEN, HUGHES              ACCOUNT NO.:  [id number]   MEDICAL RECORD NO.:  [id number]          PATIENT TYPE:  INP   LOCATION:  IC03                          FACILITY:  [facility]   PHYSICIAN:  Alison Murray, M.D.DATE OF BIRTH:  1950/12/26   DATE OF CONSULTATION:  DATE OF DISCHARGE:                                   CONSULTATION   REASON FOR CONSULTATION:  End-stage renal disease and also poorly-controlled  hypertension.   Ms. Coday is a 60 year old __________ with a past medical history of  hypertension, HIV positive, history of end-stage renal disease on  maintenance hemodialysis, presently admitted because of uncontrolled  hypertension.  Ms. Ostiguy has severe hypertension.  She was on minoxidil  and labetalol and clonidine according to the patient; however, the patient  ran out of medication and went until she gets a refill.  She did not take  any.  She went to the dialysis unit and her blood pressure was high, and she  was given labetalol and clonidine; however, her blood pressure continues to  be high with systolic blood pressure of 220.  Hence, a decision was made to  send the patient to the emergency room.  In the emergency room, still the  patient was found to have high blood pressure.  She was started on nitro and  continuous labetalol and clonidine, and she was transferred to ICU.  Presently the patient is better.  She does not have any nausea or vomiting.   PAST MEDICAL HISTORY:  1. As stated above, the patient has a history of hypertension.  2. History of HIV positive.  3. History of depression.  4. History of also intermittent chronic diarrhea.  5. History of noncompliance with her medications.  6. History of end-stage renal disease.   MEDICATIONS:  At this moment she is on Catapres 0.2 mg p.o. q.8h.  She is on  Normodyne 400 mg p.o. b.i.d.  She is also getting Bactrim one tablet p.o.  daily.  She is also on nitro for uncontrolled hypertension.  She is  also on  Renagel __________ .   ALLERGIES:  She is allergic to PENICILLIN.   REVIEW OF SYSTEMS:  At this moment, is okay.  She does not have any nausea  or vomiting, and also she used to have the headache, here it has improved.  Otherwise no complaints.   PHYSICAL EXAMINATION:  VITAL SIGNS:  Her blood pressure is 150/90, heart  rate is 80, respiratory rate is 15.  HEENT:  No conjunctival pallor, no icterus.  Oral mucosa seems to be dry.  NECK:  Supple, no JVD.  CHEST:  Clear to auscultation, no rales, no rhonchi, no egophony.  CARDIAC:  Her heart exam reveals __________  no murmur.  ABDOMEN:  Soft, positive bowel sounds.  EXTREMITIES:  No edema.   Her blood work from yesterday:  Her white blood cell count is 4.4,  hemoglobin 11.1, hematocrit 34.6, platelets of 101.  Her sodium today is  129, potassium 4.5, BUN 9, creatinine 4.8, calcium of 8.1.   ASSESSMENT:  1. Hypertension, uncontrolled mainly because she ran out  of her medication      and she did not pick up her medication from the pharmacy, and because      of that the patient developed significant hypertension.  Presently she      is on clonidine and labetalol, where is not taking minoxidil, and also      she is on nitro drip.  Her high blood pressure seems to be better.  2. __________ .  She is status post hemodialysis yesterday.  Her BUN and      creatinine is in acceptable range, normal potassium.  No significant      sign of fluid overload.  3. Human immunodeficiency virus positive.  4. History of __________ adenopathy and also cardiac enlargement with some      lymph nodes, not sure whether the patient is developing some      complication from her human immunodeficiency virus.  Presently she is      not on any medication except Bactrim and she is being followed by      infectious disease clinic in [place].  5. History of chronic diarrhea.  6. History of hypoalbuminemia secondary to malnutrition.   RECOMMENDATIONS:   We will continue with the labetalol.  Will continue with  clonidine.  At this moment we discontinue the nitro.  We will put her on  minoxidil.  She has been controlled very well with that and so we will just  use the nitro, and we will make arrangements for her to get dialysis  tomorrow, which is her regular day.      Alison Murray, M.D.  Electronically Signed     BB/MEDQ  D:  12/24/2005  T:  12/24/2005  Job:  [id number]

## 2010-07-18 NOTE — H&P (Signed)
[name], [name]              ACCOUNT NO.:  [id number]   MEDICAL RECORD NO.:  [id number]          PATIENT TYPE:  INP   LOCATION:  A202                          FACILITY:  [facility]   PHYSICIAN:  Delphina Cahill, M.D.        DATE OF BIRTH:  12-20-1950   DATE OF ADMISSION:  02/21/2006  DATE OF DISCHARGE:  LH                              HISTORY & PHYSICAL   PRIMARY MEDICAL DOCTOR:  Dr. Rosita Fire.   NEPHROLOGIST:  Dr. Lowanda Foster.   INFECTIOUS DISEASE DOCTOR:  Dr. Megan Salon, who he has not seen in greater  than 1 year.   REASON FOR ADMISSION:  Fever and weakness and shortness of breath.   HISTORY OF PRESENT ILLNESS:  [name] is a 60 year old Afro-American  female with end-stage renal disease, on dialysis just started  approximately 2 weeks ago, who also is HIV-positive.  Apparently, she  was in her usual state of health up until 2 days ago, when she came into  the emergency department due to a headache, vomiting and fever.  She  also noted some cough, which she states had been going on for last  several weeks.  She was seen initially in the ER and given a dose of  vancomycin and given a prescription for Levaquin and sent home to get  dialysis today.  Apparently, they had to move up her scheduled dialysis,  which is normally Monday/Wednesday/Friday, to Sunday due to the  holidays.  She went to dialysis today, was having still some nausea and  generalized weakness, did okay throughout dialysis, but required some  oxygen and on getting ready to leave from dialysis, was too weak to make  it to her ride and was still feeling diaphoretic and weak and now is  complaining more of shortness of breath.  She was sent again to the  emergency department and seen by Dr. Olin Hauser, who again noted that she  had a fever and worsening leukocytosis; this will be mentioned in the  lab work below.  At this current time, the patient states that she is  not having any specific chest pains, still feels  mildly short of breath,  but worsens with exertion, denies any specific abdominal pain.  No  rashes.  No exposure to anybody with illness of that she knows.   PAST MEDICAL HISTORY:  1. HIV-positive, which she believes she found out back in early 2006.      She was unclear of the last time she had a CD4 count checked, but      it was mention a year and a half ago she was greater than 400, but      in July 2007, her CD4 count was listed as 170.  2. Hyperproteinemia.  She has had workup of this before, but unclear      exactly the nature.  She has seen Dr. Tressie Stalker and apparently had      an SPEP done in May of 2006, which showed immunosuppression or      antigenic stimulus, but never had a full workup with bone marrow  biopsy at that time.  3. She had a history of severe hypertension.  4. Of course, end-stage renal disease, on dialysis, felt secondary      either to HIV versus severe hypertension.  5. She has history of MRSA sepsis before.  6. History of pneumonia.  7. Chronic thrombocytopenia.  8. Mild anemia.  9. History of hyponatremia, hypokalemia and hypoalbuminemia.  10.History of pancreatitis before.  11.History of lymph node dissection of unknown reason; biopsy was done      in July 2003 which showed florid follicular hyperplasia and      plasmacytosis at that time, unclear if full workup.   PAST SURGICAL HISTORY:  Per her report, she had a hysterectomy and  denies any other specific procedures.   MEDICATIONS:  As best I can tell, she is on:  1. Nephro-Vite one tablet p.o. daily.  2. Nexium 40 mg p.o. daily.  3. Labetalol 300 mg p.o. b.i.d.  4. Catapres patch, TTS-3, topically weekly,  5. Minoxidil 5 mg p.o. b.i.d.  6. Darvocet-N 100 p.o. q.6 h. p.r.n. for pain.  7. Renagel 1600 mg p.o. b.i.d. with food.  8. There was mention of history of being on Septra before, but the      patient states she has not had this is some time.  9. Also mentions having a Z-Pack before,  but it has been some time      since this is was given.   ALLERGIES:  She has a PENICILLIN allergy; it caused rash and swelling  per her report.   SOCIAL HISTORY:  She lives by herself here.  She is a former smoker,  quit several years ago, former drinker, but quit several years ago.  Denies any other drug use.   FAMILY HISTORY:  Mother died at a young age, but unknown exactly as to  cause.  She does not know any other history with her siblings or father.   REVIEW OF SYSTEMS:  As mention in the HPI, the patient notes nausea and  vomiting, shortness of breath with cough times several weeks, productive  of some yellow-to-green sputum.  She denies any problems with bowel or  bladder, states that though her frequency of bladder has gone down since  being on dialysis.  She denies any black tarry stools.  No lower  extremity edema.  No rashes per report.   PHYSICAL EXAMINATION:  VITAL SIGNS:  On coming into the emergency  department, she was noted to have a temperature initially of 98.6, but  went up to 101.2, blood pressure was 146/71, pulse was 82, respirations  20, saturating 96% on room air.  Weight is 120.2 pounds.  GENERAL:  This  is a thin Afro-American female lying in bed in no acute distress.  HEENT:  No scleral icterus.  Oropharynx is clear, no signs of thrush.  Mucous membranes are moist.  NECK:  No palpable lymph nodes appreciated.  Neck is supple.  No  thyromegaly.  No carotid bruits.  CARDIOVASCULAR:  Regular rate and rhythm.  No murmurs, gallops or rubs.  LUNGS:  Did have a few coarse breath sounds bilaterally in the lower  portion of the lungs. no wheezing, no specific consolidation  appreciated.  ABDOMEN:  Mildly protuberant, does have a midline scar and a ventral  hernia appreciated, small, approximately quarter size, no specific  pattern splenomegaly appreciated.  Positive bowel sounds throughout. EXTREMITIES:  No lower extremity edema.  2+ pulses in all  extremities.  NEUROLOGIC:  The patient is alert and oriented x3.  No specific deficits  appreciated.  SKIN:  No rashes, erythema or breakdown appreciated.   LABORATORY WORK:  Lab work obtained over the last 2 days reveals initial  UA showing trace leukocytes, greater than 300 protein, large blood,  trace ketones, very cloudy appearance.  Urine microscopy showed many  bacteria, wbc's 11-20, rbc's 21-50; this was not sent for culture.  Blood culture was drawn which showed gram-positive cocci in chains.  Blood cultures again were obtained today x2.  CBC initially revealed a  white count of 10.1, hemoglobin of 11.9, platelet count of 113, with ANC  of 8.5 and neutrophils 84.  Today's CBC reveals a white count of 16.9,  hemoglobin of 11.7, platelet count of 102, absolute neutrophil count of  14.5 and neutrophils of 86.  ABG was also obtained on 1 L and reveals pH  of 7.53, pCO2 of 35.6, pO2 of 94.6.   IMAGES OBTAINED:  Chest x-ray reveals a stable marked cardiomegaly, mild  interstitial pulmonary edema and does note a previous right axilla node  dissection.  Does note diffuse interstitial pulmonary edema.   IMPRESSION:  This is a 60 year old Afro-American female with end-stage  renal disease, also HIV-positive with unknown specific immunocompromised  state, although last CD4 count was 170 without any further treatment  between now and then.  She has never been on any therapy for her HIV. I  question whether this related to AIDS-defining illness.   ASSESSMENT/PLAN:  Fever and leukocytosis:  She does have significant  left shift.  A blood culture has gram-positive cocci in chains.  She has  received vancomycin yesterday and again today with dialysis, as well as  1 dose of Levaquin.  I question, given her shortness of breath, whether  this is related to an early pneumonia with some mild pulmonary edema as  seen on chest x-ray, whether this could possibly be Pneumocystis carinii  pneumonia  with degree of cough and fever that she is having.  Will place  a PPD test to rule out tuberculosis.  I do believe she would be able to  mount that, given her white count.  We will get an LDH level at next  blood draw, as well as get a sputum culture and send for  immunofluorescent staining for PCP if possible; if unable to get that,  then possibly need to get an induced sputum.   Significant cardiomegaly:  I question whether she has had an  echocardiogram done before.  Given her cardiomegaly and pulmonary edema,  we will check a 2-D echocardiogram on her as well.   Thrombocytopenia/mild anemia:  This may be secondary to her  immunocompromised state and HIV-positive as well as end-stage renal  disease.   Hypernatremia with hypokalemia:  She just did have dialysis done and  will not make any manipulation to this at this time; we will rely on Dr.  Lowanda Foster for input with this.  Question of a urinary tract infection, given the results from the  urinalysis and urine microscopy:  We will attempt to send it off for  another culture, although she has received a dose of antibiotics.   Hyperproteinuria/hyperproteinemia:  The patient has had some workup  before, unclear exact degree, was seen by Dr. Tressie Stalker from before, but  refused apparently bone marrow biopsy, has had SPEP done before,  continues to have elevation and question whether this is related to her  HIV, bone marrow suppression or whether has  some other sign such as  lymphoma, although nothing has shown up on lymph nodes before.  A CT of  her chest may help elaborate on some of the lymphadenopathy previously  mentioned, as well as rule out Pneumocystis carinii pneumonia.   Hypertension:  Will continue on all of her medicines as previously  prescribed.   DISPOSITION:  The patient will be followed up in the morning by Dr.  Legrand Rams.  I have already put in a consult for Dr. Lowanda Foster to follow along  with her as well as may consider  getting a consult per Dr. Tressie Stalker to  complete this lady's workup.  She showed up for an appointment with her  ID physician last week, but was not scheduled and so was rescheduled for  an appointment with Dr. Megan Salon on January 10?   HIV positive.  We will check a CD4 count to see where this stands, to  see whether this is related an AIDS-defining illness.      Delphina Cahill, M.D.  Electronically Signed     ZH/MEDQ  D:  02/21/2006  T:  02/22/2006  Job:  [id number]

## 2010-07-18 NOTE — Op Note (Signed)
[name], [name]              ACCOUNT NO.:  [id number]   MEDICAL RECORD NO.:  [id number]          PATIENT TYPE:  AMB   LOCATION:  SDS                          FACILITY:  [facility]   PHYSICIAN:  Nelda Severe. Kellie Simmering, M.D.  DATE OF BIRTH:  [birth year]   DATE OF PROCEDURE:  01/07/2005  DATE OF DISCHARGE:  01/07/2005                                 OPERATIVE REPORT   PREOPERATIVE DIAGNOSIS:  End-stage renal disease.   POSTOPERATIVE DIAGNOSIS:  End-stage renal disease.   OPERATION:  1.  Bilateral ultrasound localization, internal jugular veins.  2.  Insertion of Diatek catheter via right internal jugular vein (28 cm).   SURGEON:  Nelda Severe. Kellie Simmering, M.D.   FIRST ASSISTANT:  Nurse.   ANESTHESIA:  Local.   PROCEDURE:  The patient was taken to the operating room and placed in the  supine position, at which time the upper chest and neck were exposed.  Both  internal jugular veins were imaged using B-mode ultrasound, both noted to be  widely patent and normal in appearance.  After infiltration with 1%  Xylocaine, the right internal jugular vein was entered using a  supraclavicular approach and guidewire passed into the right atrium under  fluoroscopic guidance.  After dilating the tract appropriately, a 28-cm  Diatek catheter was passed through a peel-away sheath, positioned in the  right atrium, tunneled peripherally and secured with nylon sutures.  Wound  was closed with Vicryl in a subcuticular fashion, sterile dressing applied  and the patient taken to the recovery room in satisfactory condition.           ______________________________  Nelda Severe Kellie Simmering, M.D.     JDL/MEDQ  D:  01/07/2005  T:  01/08/2005  Job:  [id number]

## 2010-07-18 NOTE — Group Therapy Note (Signed)
NAMESANOVIA, SKUTT              ACCOUNT NO.:  [id number]   MEDICAL RECORD NO.:  [id number]          PATIENT TYPE:  INP   LOCATION:  A341                          FACILITY:  [facility]   PHYSICIAN:  Edward L. Luan Pulling, M.D.DATE OF BIRTH:  Nov 10, 1950   DATE OF PROCEDURE:  04/17/2006  DATE OF DISCHARGE:                                 PROGRESS NOTE   Ms. Tra is admitted with a pneumonia.  Chest x-ray done yesterday,  interestingly, shows increased changes of pneumonia, and this had been  present and then seemed to clear, now is more present again.  She,  however seems to be clinically doing fairly well.   Her exam shows temperature is 97.4, pulse 60, respirations 13, blood  pressure 115/68, O2 saturations is 96% on room air.   ASSESSMENT:  She is slowly improving.   PLAN:  I do not think there is a great deal to add at this point.  I  would plan to continue with current medications.      Edward L. Luan Pulling, M.D.  Electronically Signed     ELH/MEDQ  D:  04/17/2006  T:  04/17/2006  Job:  [id number]

## 2010-07-18 NOTE — Consult Note (Signed)
NAMEJHOURNEE, MURDOCH              ACCOUNT NO.:  [id number]   MEDICAL RECORD NO.:  [id number]          PATIENT TYPE:  INP   LOCATION:  [id number]                          FACILITY:  [facility]   PHYSICIAN:  Alison Murray, M.D.DATE OF BIRTH:  [birth year]   DATE OF CONSULTATION:  DATE OF DISCHARGE:                                   CONSULTATION   REASON FOR CONSULTATION:  Renal insufficiency.   Ms. Arnstein is a 60 year old African-American with past medical history of  HIV positive, history of hypertension, depression, presently came with  nausea, vomiting, poor appetite, and she was found to have pancreatitis and  elevated BUN and creatinine, hence consult is called.  Ms. Bringas denies  any previous history of renal failure, no history of kidney stone.  She  stated that she has been feeling good.  She is being followed at infectious  disease clinic for her HIV, and she has never been on any antiviral  medications.  According to her, the only problem she has was hypertension  for many years.  At this moment feels okay.  She denies any shortness of  breath, dizziness or lightheadedness.  Her nausea also has improved.  The  only complaint she has is headache.   PAST MEDICAL HISTORY:  1.  History of hypertension.  2.  History of depression.  3.  History of HIV infection.   PAST SURGICAL HISTORY:  History of hysterectomy.   SOCIAL HISTORY:  Denies at this moment any history of smoking or alcohol  abuse.  Previously she used to smoke and drink.   Her medication at this moment consists of:  1.  Norvasc 10 mg p.o. daily.  2.  Lexapro 10 mg p.o. daily.  3.  Normodyne 100 mg p.o. b.i.d.  4.  Protonix 40 mg p.o. daily.  5.  She is getting IV fluids at 80 mL/hr.  6.  She is getting also Xanax and Maalox and Ambien.   ALLERGIES:  She is allergic to PENICILLIN.   REVIEW OF SYSTEMS:  Overall feels okay.  She denies any nausea or vomiting.  Appetite is reasonable.  The only complaint she has  is headache.  No  dizziness, no lightheadedness.  She denies also chest pain, urgency or  frequency.   PHYSICAL EXAMINATION:  VITAL SIGNS:  On examination, her temperature is  98.6, blood pressure 171/92, pulse of 72.  HEENT:  No conjunctival pallor.  No icterus.  Oral mucosa seems to be dry.  NECK:  Supple, no JVD.  CHEST:  Clear to auscultation.  CARDIAC:  A regular rate and rhythm, no murmur, no S3.  ABDOMEN:  Soft, positive bowel sounds.  EXTREMITIES:  No edema.   Her input is 4000, output 2400, with 1600 positive fluid balance.  Her blood  work today:  Her white blood cell count is 5, hemoglobin 7.9, hematocrit  23.4, platelets of 217.  Her __________ is 133, potassium 4.3, BUN 21,  creatinine is 3.5.  Since she came, her creatinine has remained around  there.  Albumin is 1.6, calcium of 7.6.  Amylase was 240, lipase 41.  Her  iron saturation is 21, ferritin is 121.  Her urine collection, her  creatinine clearance is 26 mL/min.  She has 24-hour protein of 1770.  The  ultrasound of her kidneys showed mild hydronephrosis, left kidney 10.5,  __________ kidney 11.8.   ASSESSMENT:  1.  Renal insufficiency.  At this moment not sure whether this is chronic or      acute in a patient with significant proteinuria, nephrotic range;      however, her albumin also seems to be significantly low.  The etiology      for her renal insufficiency could be multifactorial, including human      immunodeficiency virus nephropathy versus hypertensive nephrosclerosis.      At this moment, however, we cannot rule out other etiologies.  2.  Anemia, which is significant.  Her hemoglobin and hematocrit seem to be      low.  Her iron saturation is reasonable at this moment.  Not sure      whether all of this is related to her human immunodeficiency virus and      also probably to chronic renal failure.  3.  Hypertension.  On a couple of medications.  Blood pressure seems to be      controlled reasonably  good.  4.  Human immunodeficiency virus.  The patient is not on any antiviral.  I      am not sure whether she has other also problems from before, since she      is complaining of headache at this moment.  A fungal or viral infection      also needs to be entertained.   RECOMMENDATIONS:  Will continue hydration, probably try to use some  diuretics.  At this moment, since she has significant proteinuria, using  ARBs or ACE inhibitor may be a reasonable thing to do.  The patient may  benefit from Epogen as an outpatient.  Probably the patient also may require  blood transfusion.  Will continue the other medication as before.      BB/MEDQ  D:  07/23/2004  T:  07/23/2004  Job:  [id number]

## 2010-07-18 NOTE — Discharge Summary (Signed)
[name], [name]                        ACCOUNT NO.:  [id number]   MEDICAL RECORD NO.:  [id number]                   PATIENT TYPE:  INP   LOCATION:  A307                                 FACILITY:  [facility]   PHYSICIAN:  Vernon Prey. Tamala Julian, M.D.                DATE OF BIRTH:  1950/12/25   DATE OF ADMISSION:  09/20/2002  DATE OF DISCHARGE:  09/29/2002                                 DISCHARGE SUMMARY   DISCHARGE DIAGNOSES:  1. 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  2. Acute pancreatitis.  3. Hypertension.  4. Situational depression.  5. Gastroesophageal reflux disease.   SPECIAL PROCEDURE:  Right axillary node dissection, 19 September 2002.   DISPOSITION:  The patient is discharged home in satisfactory condition.  She  will be followed up in the office one week post discharge.   DISCHARGE MEDICATIONS:  1. Demerol 50 mg one or two q.4 h. p.r.n. pain.  2. Tarka 4/240 mg daily.  3. Remeron Soltab 30 mg nightly.  4. Zoloft 50 mg daily.  5. Prilosec 20 mg b.i.d.  6. Phenergan 25 mg q.4 h. p.r.n. nausea.   SUMMARY:  [age]-year-old female with a history of onset of diffuse  lymphadenopathy in early June.  CT scan revealed bilateral submandibular,  cervical, right axillary and mediastinal adenopathy.  There was evidence of  bilateral iliac and common femoral adenopathy.  The pattern was felt to be  consistent with lymphoma but we needed diagnosis.  The patient was admitted  through day surgery and underwent right axillary node dissection on 20 July.  Frozen section diagnosis initially was lymphoma, type undefined.  The  patient had recurrent abdominal pain with nausea and vomiting in the early  postoperative period.  Evaluation revealed this to be acute pancreatitis.  She was treated symptomatically and this gradually resolved.  Her high blood  pressure was controlled initially with IV and then with oral (p.o.)  antihypertensive, specifically with Tarka 4/240 mg.  Blood  pressure was  gradually under control and by 23 September 2002, blood pressure was 129/89.  Amylase was 260 at that time and lipase was 130.  The patient gradually  improved.  We made arrangements for home care over the next few days.  Her  lipase returned to  normal but her amylase remained slightly elevated, even at the time of  discharge.  By the 30th, she was stable, there was minimal drainage in her  JP drain, her abdomen was asymptomatic on exam.  It was felt that she could  be safely discharged home and followed up as an outpatient.  She was  discharged in satisfactory condition.    ___________________________________________                                         Vernon Prey Tamala Julian, M.D.  LCS/MEDQ  D:  12/23/2002  T:  12/24/2002  Job:  [id number]

## 2010-07-18 NOTE — Group Therapy Note (Signed)
[name], [name]              ACCOUNT NO.:  [id number]   MEDICAL RECORD NO.:  [id number]          PATIENT TYPE:  INP   LOCATION:  A226                          FACILITY:  [facility]   PHYSICIAN:  Estill Bamberg. Karie Kirks, M.D.DATE OF BIRTH:  08-Nov-1950   DATE OF PROCEDURE:  09/27/2005  DATE OF DISCHARGE:                                   PROGRESS NOTE   SUBJECTIVE:  The patient does feel better today. The abdominal pain is  improved.   OBJECTIVE:  VITAL SIGNS:  Temperature 97.8, pulse 62, respiratory rate 20,  blood pressure 135/98.  GENERAL:  She is sitting up in the bed with feet on the floor.  LUNGS:  Clear throughout and moving air well.  HEART:  Regular rhythm at a rate of 60.  ABDOMEN:  Soft without hepatosplenomegaly or mass and just minimal  tenderness in the lower abdomen.  EXTREMITIES:  There is no edema of the ankles.   LABORATORY DATA:  Today her white blood cell count was 5,800 with 65%  neutrophils, 22 lymphs, and 12 mono's. Hemoglobin and hematocrit 11.8 and  35.8. On a met 7, she has a sodium of 130, chloride 95, BUN 26, creatinine  6.8.   ASSESSMENT:  1. Probable viral gastroenteritis.  2. Human immunodeficiency virus.  3. Hyponatremia.  4. Chronic renal insufficiency.   PLAN:  Continue with clear liquids and low volume IV fluids. She is due to  dialysis tomorrow through Dr. Lowanda Foster. Stool studies are pending including  a C&S.      Estill Bamberg. Karie Kirks, M.D.  Electronically Signed     SDK/MEDQ  D:  09/27/2005  T:  09/27/2005  Job:  [id number]

## 2010-07-18 NOTE — H&P (Signed)
[name], [name]              ACCOUNT NO.:  [id number]   MEDICAL RECORD NO.:  [id number]          PATIENT TYPE:  INP   LOCATION:  A226                          FACILITY:  [facility]   PHYSICIAN:  Estill Bamberg. Karie Kirks, M.D.DATE OF BIRTH:  November 20, 1950   DATE OF ADMISSION:  09/25/2005  DATE OF DISCHARGE:  LH                                HISTORY & PHYSICAL   HISTORY OF PRESENT ILLNESS:  This 60 year old woman was at dialysis today,  had some fever, was given IV vancomycin and then had a higher fever, turned  red in her upper chest and neck and was brought to the [facility] Emergency  Department for evaluation.   She has a history of chronic renal insufficiency starting within the last  year.  It was not sure this was secondary to HIV or to hypertension.  She  has a history of alcoholism years ago.  She has had pancreatitis.  She has  had a number of admissions in the last few years for these.   Currently she is followed by Dr. Legrand Rams in town.  She also is seen in  infectious disease in [place] for HIV and by the record, her last CD4  lymphocyte count was over 400 1-1/2 years ago.  She has been following with  them on a regular basis.   Current meds include clonidine 0.3 mg, labetalol 200 mg b.i.d., Nexium 40 mg  daily, Darvocet-N 100 as needed, Renagel 800 mg two a day.   She is currently on dialysis through Dr. Lowanda Foster three times a week.  She  has had a history of anemia, hyperlipidemia, hypokalemia in addition to the  other diagnoses.  She has developed depression secondary to her medical  illnesses.   PHYSICAL EXAMINATION:  The physical exam today showed a pleasant middle-age  woman who was cooperative.  Temperature is 103 per rectum but went up to  105, O2 saturation was 95%.  On my exam she appeared to be oriented and  alert.  She was in no acute distress but did have some tenderness in her  abdomen.  She was well developed, well nourished.  She had no maxillary or  frontal  sinus or pain, the pharynx is normal, there was no enlargement of  the anterior cervical nodes.  The heart had a regular rhythm, rate of 90.  The lungs were clear throughout moving air well.  The abdomen was soft but  was tender on deep palpation, somewhat distended and right in the mid  abdomen between the xiphoid process and the umbilicus there was an area of  tenderness, no bulging, about the size of a quarter.  There is no edema of  the ankles.  Examination of skin of her chest showed a diffuse erythema from  superior part of the breast up to the neck.   LABORATORIES:  Her admission white cell count is 9400 of which 85%  neutrophils, 11% lymphs, 4% monos.  The H&H were 13.2 and 41.0, MCV of 80  and platelet count 111,000.  CMP showed a sodium of 134, potassium 3.1, BUN  8, creatinine  4.2, calcium 7.7, total protein 11.5, albumin 1.9, SGOT 47,  SGPT 26, bilirubin 1.4.  Amylase 209, lipase 33.   ASSESSMENT:  1. Pneumonia versus sepsis.  2. Human immunodeficiency virus without evidence at this point for      acquired immunodeficiency syndrome.  3. Chronic renal failure currently on dialysis and this was secondary      either to uncontrolled hypertension or to human immunodeficiency virus      infection.  4. History of alcoholism in the distant past.  5. Thrombocytopenia.  6. Hypokalemia.  7. Hyponatremia.  8. Hypoalbuminemia.  9. POSSIBLE ALLERGIC REACTION TO VANCOMYCIN.   PLAN:  She has already received gentamicin in the ER and she had vancomycin  she received already.  She will be on IV of 1/2 normal saline at 50 mL/hr  and get Zofran 4 mg IV q.4 h. for nausea, Bactrim DS p.o. b.i.d.  T4 helper  lymphocyte count is pending.  I have reviewed her chest x-ray which shows  the heart possibly mildly enlarged but no obvious infiltrates.  She will be  followed by Dr. Legrand Rams.  Dr. Lowanda Foster is her nephrologist.      Estill Bamberg. Karie Kirks, M.D.  Electronically Signed     SDK/MEDQ   D:  09/25/2005  T:  09/25/2005  Job:  [id number]

## 2010-07-18 NOTE — Procedures (Signed)
[name], [name]              ACCOUNT NO.:  [id number]   MEDICAL RECORD NO.:  [id number]          PATIENT TYPE:  INP   LOCATION:  IC03                          FACILITY:  [facility]   PHYSICIAN:  Edward L. Luan Pulling, M.D.DATE OF BIRTH:  [birth year]   DATE OF PROCEDURE:  DATE OF DISCHARGE:                                EKG INTERPRETATION   TIME OF EXAM:  16:47   The rhythm is sinus rhythm with a rate in the 70s.  There is left axis  deviation.  Incomplete right bundle branch block is seen.  There is probable  left atrial enlargement.  ST-T wave abnormality is seen which may indicate  ischemia.  Abnormal electrocardiogram.      [name]. Luan Pulling, M.D.  Electronically Signed     ELH/MEDQ  D:  12/23/2005  T:  12/24/2005  Job:  [id number]

## 2010-07-18 NOTE — Discharge Summary (Signed)
[name], BRADT              ACCOUNT NO.:  [id number]   MEDICAL RECORD NO.:  [id number]          PATIENT TYPE:  INP   LOCATION:  [id number]                          FACILITY:  [facility]   PHYSICIAN:  Bonnielee Haff, MD     DATE OF BIRTH:  [birth year]   DATE OF ADMISSION:  07/21/2004  DATE OF DISCHARGE:  05/30/2006LH                                 DISCHARGE SUMMARY   DISCHARGE DIAGNOSES:  1. Renal failure, likely chronic likely secondary to either human      immunodeficiency virus or hypertension.  2. Anemia, likely of chronic disease.  3. Hypertension.  4. Human immunodeficiency virus infection; last CD-4 count over 400.  5. Abnormal electrophoresis with monoclonal competence, etiology unclear.  6. Depression and anxiety.     HISTORY OF PRESENT ILLNESS:  Please see the H&P dictated on Jul 21, 2004,  about the patient's presenting illness.   HOSPITAL COURSE:  #1 - RENAL FAILURE:  The patient is a 60 year old African  American female who was admitted with abdominal pain, nausea, vomiting for  two days prior to admission.  She was found to have elevated creatinine of  3.5.  Her last creatinine level was normal from about one year ago.  It was  thought that the patient was likely dehydrated secondary to her vomiting and  causing renal failure.  However, even with rehydration her renal function  did not improve.  She was making an adequate amount of urine and  electrolytes were within normal limits.  The patient subsequently underwent  an ultrasound of her kidney which showed medical renal disease with mild  right hydronephrosis.  Subsequently, Dr. Lowanda Foster was consulted to  evaluate  the patient.  Multiple blood work and urine tests were ordered for this  patient and ultimately it was thought that the renal failure was likely  chronic secondary to either hypertension or her HIV.  Dr. Lowanda Foster tried IV  fluids and Lasix to improve her creatinine, however, it did not improve.  Since the  patient had significant proteinuria, Cozaar was also started,  however, her creatinine slightly worsened and hence it was discontinued.  The patient has been started on Phos-Lo and a renal vitamin.  Discussions  have been held with the patient regarding possible need for dialysis in the  future.  The patient will see Dr. Lowanda Foster in one month's time at his  office.   #2 - ANEMIA:  The patient was found to have a low hemoglobin on admission,  and it dropped down to about 7.  The patient was transfused one unit of  blood with good response.  Iron profile and other studies suggested anemia  to be of chronic disease.  The patient will probably benefit from Epogen  which will be given to her as an outpatient and has been arranged by Dr.  Lowanda Foster.   #3 - HYPERTENSION:  On admission, the patient was on Diureticsand ARBs and  ACE inhibitor.  However, because of her elevated creatinine, the patient was  switched over to Norvasc and labetalol.  With this, her blood pressure has  been optimally  controlled during this admission.  She will be continued on  the same medication as an outpatient.   #4 - HUMAN IMMUNODEFICIENCY VIRUS INFECTION:  The patient follows up with  the infectious disease clinic at [facility].  Her last CD-4 count from  January was over 400.  The patient is not any anti-retroviral medications at  this time.  She will need to continue followup with the clinic on discharge.  CD-4 count from this admission is pending.   #5 - HIGH PROTEIN:  On admission, the patient was found to have a high  protein more than 12 and a low albumin.  An SPEP was ordered which showed  two restricted bands consistent with monoclonal proteins.  Considering such  an abnormal result, a bone survey was ordered which was negative for any  lytic process or metastasis.  I held a brief discussion about this patient  with Dr. Tressie Stalker who recommended getting quantitative immunofixation.  This has been ordered  but is pending at this time.  An outpatient  consultation with Dr. Tressie Stalker will be arranged for this patient on  discharge.   #6 - PSYCHIATRIC PROBLEMS:  The patient is here with depression and anxiety  because of all of her medical problems.  She was started on Lexapro and  Xanax and her mood remained stable during this admission.  She did not have  any suicidal or homicidal ideation.   DISCHARGE MEDICATIONS:  1. Norvasc 10 mg p.o. daily.  2. Phos-Lo two tablets p.o. with each meal t.i.d.  3. Lexapro 10 mg p.o. daily.  4. Labetalol 100 mg p.o. b.i.d.  5. Prilosec 40 mg p.o. once daily.  6. Nephro-Vite one p.o. daily.  7. Xanax 0.5 mg three times daily as needed, #10 tablets have been      prescribed.   The patient is being told not to take any of her old home medications and to  take only the ones that have been prescribed today.   FOLLOWUP CARE:  1. Followup care with Dr. Alison Murray in one month's time.  2. Gaston Islam. Neijstrom, M.D.; appointment has been made for August 15, 2004,      at 11:50 a.m.  3. Patient has an appointment to see Dr. Irving Shows tomorrow, Jul 30, 2004.  4. She needs to call and make an appointment to see her infectious disease      specialist at [facility].     DIET:  The patient is to have a low salt diet.   PHYSICAL ACTIVITY:  No restrictions.   PROCEDURES PERFORMED DURING THIS HOSPITAL STAY:  1. Ultrasound of the abdomen which does not reveal any pancreatitis and      showed kidney changes as mentioned above.  2. Bone survey which showed no evidence of lytic lesions or metastasis.  3. Patient received 1 unit of packed red blood cells.        GK/MEDQ  D:  07/29/2004  T:  07/29/2004  Job:  [id number]   cc:   Alison Murray, M.D.  [address]  [place]  [place] [address]  Fax: [place]. Tamala Julian, M.D.  [address] [address]  Fax: [place]. Neijstrom, MD [address]  [place] [address]  Fax: [phone]

## 2010-07-18 NOTE — Consult Note (Signed)
[name], [name]              ACCOUNT NO.:  [id number]   MEDICAL RECORD NO.:  [id number]          PATIENT TYPE:  INP   LOCATION:  A202                          FACILITY:  [facility]   PHYSICIAN:  Alison Murray, M.D.DATE OF BIRTH:  23-Feb-1951   DATE OF CONSULTATION:  DATE OF DISCHARGE:                                 CONSULTATION   REASON FOR CONSULTATION:  End-stage renal disease.   The patient is a 60 year old female with end-stage renal disease on  maintenance hemodialysis, hypertension, and HIV.  Three days ago she  came to the emergency room with complaints of headache, vomiting, and  fever, and was treated with vancomycin and given a prescription for  Levaquin before going home.  When she came to her hemodialysis treatment  yesterday, she had a fever of 101 with weakness and dyspnea.  At the end  of her treatment she was given a dose of vancomycin but was too weak to  leave.  She was again sent to the emergency department, this time with  dyspnea, weakness, and mild nausea.  She was admitted and treated with  antibiotics.  Today she is comfortable without dyspnea and feeling  better with a fairly good appetite.   PAST MEDICAL HISTORY:  As stated above, the patient has a history of  hypertension and history of end-stage renal disease maintained on  hemodialysis, depression, and HIV.  She was previously followed by Dr.  Megan Salon of infectious disease in [place] but has not been to see  them in over a year.  A CT of the chest in October indicated bilateral  hilar adenopathy.   ALLERGIES:  She is allergic to PENICILLIN.   MEDICATIONS:  1. Nephro-Vite vitamin p.o. daily.  2. Protonix 40 mg two tablets p.o. daily.  3. Labetalol 300 mg p.o. b.i.d.  4. Minoxidil 5 mg p.o. b.i.d.  5. Renagel 800 mg two tablets with meals.  6. Catapres TTS patch 0.3 mg weekly.   SOCIAL HISTORY:  She lives alone and is a former smoker, having quit  several years ago.  She also previously  used alcohol but has quit for  several years.   FAMILY HISTORY:  She is unaware of any significant family history.   REVIEW OF SYSTEMS:  The patient is alert, in no apparent distress.  She  denies any dyspnea or chest pain at this time.  She has a fairly good  appetite without nausea or vomiting.   PHYSICAL EXAMINATION:  VITAL SIGNS:  Blood pressure 97/62, pulse 77,  temperature of 98.1.  HEENT:  Sclerae are clear and nonicteric.  Oral mucosa is moist and  pink.  NECK:  Supple without JVD, bruits, thyromegaly or lymphadenopathy.  CHEST:  Clear to auscultation without rales or rhonchi.  HEART:  Regular rate and rhythm without murmur.  ABDOMEN:  Positive bowel sounds, soft, nontender, without organomegaly.  EXTREMITIES:  No edema bilaterally.  Pulses intact.   LABORATORY DATA:  Hemoglobin and hematocrit 10.4 and 32.7, respectively.  White blood cells 12.3.  Sodium 131, potassium 3.1, CO2 29, glucose 80,  BUN and creatinine 21 and 5.3, respectively.  Urinalysis indicates trace  of leukocytes, greater than 300 protein, large blood, trace ketones, and  very cloudy appearance, with many bacteria, wbc's 11-20.  Preliminary  blood cultures indicated Streptococcus species.  Chest x-ray indicated  stable marked cardiomegaly with mild interstitial pulmonary edema.   ASSESSMENT:  1. Fever with leukocytosis.  She is currently on IV vancomycin and      Levaquin.  Chest x-ray indicated mild pulmonary edema.  However,      her white blood cell count has decreased from 16.9 to 12.3 since      receiving antibiotics.  2. End-stage renal disease.  She had a complete hemodialysis treatment      yesterday at [facility].  Currently her BUN and creatinine are      21 and 5.3, respectively.  Her potassium is 3.1.  3. 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  Her hemoglobin and      hematocrit are currently 10.4 and 32.7, respectively.  4. Hypertension.  Her blood pressure is currently  well-controlled on      minoxidil, labetalol, and Catapres patch.  5. History of human immunodeficiency virus.  She has not seen her      doctor, Dr. Megan Salon at infectious disease in [place], in nearly      a year.  She had an appointment on December 18, which was postponed      until January 10.   RECOMMENDATIONS:  Although the patient has improved while on  antibiotics, she will likely require a thorough examination of her HIV  status, including a CD4 count.  She will likely receive hemodialysis on  her regular day, in two days.  Otherwise, we will continue with present  management at this time.     ______________________________  Ramiro Harvest, P.A.-C.      Alison Murray, M.D.  Electronically Signed    CL/MEDQ  D:  02/22/2006  T:  02/22/2006  Job:  [id number]

## 2010-07-18 NOTE — H&P (Signed)
[name], [name]              ACCOUNT NO.:  [id number]   MEDICAL RECORD NO.:  [id number]          PATIENT TYPE:  EMS   LOCATION:  ED                            FACILITY:  [facility]   PHYSICIAN:  Bonnielee Haff, MD     DATE OF BIRTH:  19-Nov-1950   DATE OF ADMISSION:  07/21/2004  DATE OF DISCHARGE:  LH                                HISTORY & PHYSICAL   ADMITTING DIAGNOSES:  1.  Acute renal failure.  2.  ?Acute pancreatitis.  3.  Hypertension.  4.  HIV infection.  5.  Depression.   CHIEF COMPLAINT:  Nausea, vomiting for the past two days.   HISTORY OF PRESENT ILLNESS:  The patient is a 60 year old African-American  female who has a history of hypertension, HIV infection, depression, who  presented to the emergency department with complaints of nausea and vomiting  for the past one to two days. The patient is not a very good historian and  unable to give specific details regarding her presenting problems. The  patient states that yesterday she was well until later in the day when she  started feeling sick and started having vomiting. She could not keep any  food down and these symptoms got worse today and she decided to come to the  emergency department. She also mentioned pain in her abdomen, which she  states is now better. The pain started after her episodes of nausea and  vomiting. The patient gives a history of vomiting out food material, however  at no point there was any blood in it. She gives a history of having a bowel  movement yesterday and otherwise does not give history of any constipation  or diarrhea. The abdominal pain was mostly in the upper abdomen, described  as sharp character. It was described as about 9 out of 10 in intensity.  There was no radiation of the pain to any other location. There were no  alleviating or aggravating factors for this pain. The patient called Dr.  Thompson Caul office, but could not wait for him to call in a prescription and  decided to come  into the emergency department. Currently, the patient is  lying comfortably and does not have as severe symptoms as she was having  earlier in the day.   MEDICATIONS:  The patient is unsure of what medications she is on at this  time, she states she takes:  1.  Tarka unknown dose.  2.  Diovan 320 mg daily.  3.  Prilosec 40 mg daily.  4.  Some anxiety medication, she does not know the name. She also mentioned      hydromorphone but it is not clear why she takes it and if at all she      takes it.   ALLERGIES:  PENICILLIN causes rash.   PAST MEDICAL HISTORY:  1.  Significant for hypertension for about the past two or three years as      per the patient.  2.  HIV infection, diagnosed a few years ago. However, she is not on any      antiretroviral  medication at this time. Her last CD4 count was 430,      which was on April 01, 2004. The patient mentioned she goes to a      clinic in [place] for this condition. However, she has not been      started on any medication.  3.  She also has depression.   PAST SURGICAL HISTORY:  Hysterectomy in the past.   SOCIAL HISTORY:  The patient lives alone. She is a former smoker, quit a few  years ago. She also is a former alcohol drinker, also quit a few years ago.  Denies any illicit drug use at this time.   FAMILY HISTORY:  Her mother died when she was very young. The patient does  not remember if any of her siblings or father had any medical problems.   REVIEW OF SYSTEMS:  A 10-point review of systems was done, which was  positive for some tingling and numbness in both her upper extremities, which  are chronic. She also mentions some tingling and numbness in her lower  extremities, which has been ongoing for many months. She denies any focal  weakness in any of her extremities at this time. She does mention that she  has been passing urine with no difficulty and has not noticed any dysuria or  hematuria. She has not noticed any black  stools or any blood in her stools  either.   PHYSICAL EXAMINATION:  VITAL SIGNS:  Temperature 97.0, blood pressure  158/110, heart rate 73, respiratory rate 20.  GENERAL:  Well developed, well nourished female in no apparent distress.  HEENT:  There is no pallor, no icterus. Oral mucous membranes are moist. No  oral lesions are seen.  NECK:  There is a lymph node palpable on the posterior aspect of her neck  below the occipital region. It is about 1.5 cm in size. It is mobile, firm  in consistency. Soft and supple otherwise.  CARDIOVASCULAR:  S1, S2 normal and regular. No murmurs appreciated. No  bruits were heard.  LUNGS:  Clear to auscultation bilaterally.  ABDOMEN:  Scar in the midline, the lower abdomen area. Bowel sounds were  present. Markedly soft, nontender. Liver edge was palpable in the right  upper quadrant. Otherwise, no other mass or organomegaly was appreciated.  EXTREMITIES:  Did not reveal any edema. The calves were without any  tenderness. No erythema was noted. Peripheral pulses were palpable.  NEUROLOGIC:  The patient was grossly intact.   LABORATORY DATA:  CBC shows white count 5.8 with 46% neutrophils. Hemoglobin  is 9.4. Hematocrit 28.8. MCV 77. A review of her previous labs shows that  the patient has had hemoglobin in this range for at least the past two  years.   Sodium was 133, potassium 4.8, chloride 109, bicarb 22, glucose 87, BUN  elevated at 32, creatinine elevated at 3.5. Her baseline creatinine is  completely normal from one year ago. Total bilirubin was 0.4. Her alkaline  phosphatase, ALT, and AST were all within normal limits.   Total protein noted to be more than 12, which on review of her previous  labs, had been in that range for the past two years. Albumin is 2.0, also  appears to be close to baseline. Lipase elevated at 75, amylase is not  available.  Urinalysis was done, which showed moderate blood, more than 300 protein was  noted,  negative nitrite, negative leukocytes. There were 7-10 RBC's.   No imaging studies have been  done so far.   IMPRESSION:  The patient is a 60 year old African-American female with a  history of hypertension, HIV infection, history of pancreatitis in the past,  who presents to the emergency department with symptoms of nausea, vomiting,  abdominal pain for the past two days, and is found to have elevated lipase  and found to be in acute renal failure.   Her abdominal symptoms and her nausea and vomiting could definitely be  secondary to episode of acute pancreatitis. The patient did have a similar  episode back in April of 2005, when she had an ultrasound done, which showed  gallbladder dysfunction with gallbladder sludge and possible small stones,  which were thought to contribute to acute pancreatitis. The patient does not  consume any alcohol, which rules out that as an etiology. She is also not on  any antiretroviral medication which could also cause pancreatitis. We will  need to do further imaging studies and further blood work to ascertain the  cause. Her liver function tests are normal at this time and there is no  urgent need for any aggressive intervention at this time.   Regarding her acute renal failure, it is likely that this could be prerenal  because of dehydration from her vomiting. However, the patient also has HIV,  which could also contribute to some nephropathy. Again, we will obtain  imaging studies to rule out any anatomical problem with the kidneys. The  patient is making urine at this time and has normal pottasium, hence, there  is no emergent intervention required.   Once again, the patient is noted to have a very high protein with a low  albumin. The patient has had SPEP in the past and this was actually done in  August of 2004 and the immunofixation report mentioned that there is a  slight area of restriction of the lambda lane, without IgG, IgA, IgM, IgB,  or  IgE heavy chain. There was a weak monoclonal free lambda light chain  which could not be excluded. They had recommended to repeat this blood work  in about six to eight months. Sometimes, reactive hypergammaglobin is seen  in patients with HIV, which could definitely be the case. However, we will  need to get in touch with Dr. Tamala Julian to see if this has been worked up by his  office.   PLAN:  1.  Acute pancreatitis. We will keep the patient NPO for now. We will give      her antiemetics on a p.r.n. basis. We will give her IV fluids and      hydrate her adequately. We will obtain ultrasound of her abdomen      tomorrow to delineate the cause for the pancreatitis, to look at the CBD      as well as to see how inflamed the pancreas is at this time. CT scan      cannot be done because of acute renal failure.   1.  Acute renal failure as mentioned above, likely secondary to prerenal     azotemia. Other differentials include HIV nephropathy, obstruction which      has been ruled out because the patient passed urine. We will obtain      ultrasound of her kidneys to look for any anatomical problems. We will      also obtain urine electrolytes to calculate a FENa. We will also obtain      a CK level to rule out rhabdomyolysis.   1.  Hyperproteinemia  with low albumin. We will recheck SPEP with      immunofixation and a UPEP. I will discuss with Dr. Tamala Julian tomorrow to see      if further workup was done in his office for these abnormal lab studies.   1.  HIV infection. We will check a CD4 count to assure the patient does not      need any prophylactic coverage. She does follow-up at a [facility]      clinic for this condition.   1.  Hypertension. I will be holding her antihypertensive agents, both of      which can aggravate her renal failure at this time. I will start the      patient on Norvasc to control her blood pressure and add other agents as      needed.   1.  We will cover the patient  with Protonix for GI prophylaxis at this time.   Further management decisions will be based on results of initial testing and  patient's response to treatment.      GK/MEDQ  D:  07/21/2004  T:  07/21/2004  Job:  [id number]   cc:   Vernon Prey. Tamala Julian, M.D.  [address]  [place]  [place] [address]  Fax: [phone]   Alison Murray, M.D.  [address]  [place]  [place] [address]  Fax: [phone]

## 2010-07-18 NOTE — H&P (Signed)
[name], [name]              ACCOUNT NO.:  [id number]   MEDICAL RECORD NO.:  [id number]          PATIENT TYPE:  INP   LOCATION:  A341                          FACILITY:  [facility]   PHYSICIAN:  Unk Lightning, MDDATE OF BIRTH:  1950/04/21   DATE OF ADMISSION:  04/11/2006  DATE OF DISCHARGE:  LH                              HISTORY & PHYSICAL   The patient is a 60 year old black female patient of Dr. Legrand Rams who is  HIV positive with end-stage renal disease currently on dialysis.  The  patient complains of some nausea and vomiting a few days prior to  admission.  She states there was some mild blood in the vomitus, and she  complains of generalized weakness.  She was seen in the ER.  She had a  temperature of 103 and was found to have a right lower lobe infiltrate  on chest x-ray.  She is admitted for pneumonia.  Most of her GI  symptomatology has resolved by the time of admission.  She denies cough,  sputum, hemoptysis, melena, hematemesis, hematochezia.  She is mildly  dyspneic at rest and denies any anginal chest pain or orthopnea.   PAST MEDICAL HISTORY:  Significant for:  1. HIV positive.  2. End-stage renal disease on dialysis.  3. Hypertension.   PAST SURGICAL HISTORY:  Left arm dialysis graft.   SOCIAL HISTORY:  She currently lives with her sister, is a nonsmoker and  does not imbibe alcohol.  She has one daughter grown.   CURRENT MEDICATIONS:  1. Catapres TTS 3 weekly.  2. Kaletra 2 tablets p.o. b.i.d.  3. Zidovudine 50 mg/mL at 2 mL every other day.  4. Nexium 40 mg per day.  5. Minoxidil 5 mg p.o. b.i.d.  6. Labetalol 300 mg p.o. b.i.d.  7. Epivir 10 mg/mL at 2.5 mL daily orally.  8. Renagel 800 mg 2 p.o. 3 times a day.  9. Albuterol nebulizer.   PHYSICAL EXAMINATION:  VITAL SIGNS: Blood pressure 133/104, pulse 96 and  regular, respiratory rate 24, O2 saturation 92%.  HEAD:  Normocephalic and atraumatic.  EYES:  PERRLA.  Sclerae clear.  Conjunctivae  pink.  NECK:  No JVD, no carotid bruits, no thyromegaly.  LUNGS: Diminished breath sounds at the bases.  No rales, wheeze, rhonchi  appreciable.  HEART: Regular rhythm, 1/6 flow murmur.  No S3, S4, gallops, rubs.  ABDOMEN:  Soft.  There is a mild protuberant hernia, ventral. No  guarding, rebound, no masses, no organomegaly.  EXTREMITIES: 1+ pedal edema.  NEUROLOGIC: Cranial nerves II-XII grossly intact.  The patient moves all  four extremities.   IMPRESSION:  1. Right lower lobe infiltrate.  2. Human immunodeficiency virus positive.  3. End-stage renal disease on dialysis.  4. Hypertension.  5. Hyponatremia.   PLAN:  1. Admit.  2. Rocephin and Zithromax.  3. Culture sputum.  4. Culture blood.  5. Renal consult for dialysis.  6. Monitor BMETs daily.  7. Give normal saline to rectify low sodium.  8. I will make further recommendations as the database expands.      Richard Legrand Como DonDiego,  MD  Electronically Signed     RMD/MEDQ  D:  04/11/2006  T:  04/11/2006  Job:  [id number]

## 2010-07-18 NOTE — Group Therapy Note (Signed)
[name], [name]              ACCOUNT NO.:  [id number]   MEDICAL RECORD NO.:  [id number]          PATIENT TYPE:  INP   LOCATION:  A226                          FACILITY:  [facility]   PHYSICIAN:  [name], M.D.DATE OF BIRTH:  1950/07/25   DATE OF PROCEDURE:  DATE OF DISCHARGE:                                   PROGRESS NOTE   The patient feels slightly better.  Still having diarrhea.  GENERAL:  Supine in bed. She is in no acute distress. She is well-developed,  well-nourished. Oriented and alert today.  VITAL SIGNS:  Temperature is 98.1, pulse 68, respiratory rate 21, blood  pressure 133/91.  LUNGS:  Clear throughout.  HEART:  Has a regular rhythm, rate of 70.  ABDOMEN:  Soft without hepatosplenomegaly or masses. Mild tenderness to  palpation.  EXTREMITIES:  There is no edema in the ankles.   White cell count today is 13,900 of which 81% are neutrophils, 11 lymphs,  platelet count 105,000, H&H 12.0 and 38.4. Sodium is 134.   ASSESSMENT:  1. Probable viral gastroenteritis.  2. Human immunodeficiency virus.  3. Hyponatremia.   PLAN:  Continue intravenous fluids. Recheck CBC and MET-7 in the morning.  Add anticoagulation if she is to be immobile any further at that point (she  did not want anticoagulant injections today).      [name], M.D.  Electronically Signed     SDK/MEDQ  D:  09/26/2005  T:  09/26/2005  Job:  [id number]

## 2010-07-18 NOTE — Procedures (Signed)
NAMEJAZZMYN, BYES              ACCOUNT NO.:  [id number]   MEDICAL RECORD NO.:  [id number]          PATIENT TYPE:  INP   LOCATION:  A202                          FACILITY:  [facility]   PHYSICIAN:  Thomas C. Wall, MD, FACCDATE OF BIRTH:  [birth year]   DATE OF PROCEDURE:  02/24/2006  DATE OF DISCHARGE:                                ECHOCARDIOGRAM   The echocardiogram was ordered because of a history of hypertension.    1. Technically adequate.  2. Normal left atrial size.  Mild right atrial enlargement.  3. Mild right ventricular dilatation with mild hypertrophy and mild      systolic dysfunction.  4. Normal pulmonic valve and proximal pulmonary artery.  5. Normal tricuspid valve.  Estimated right ventricular systolic      pressure is mildly elevated.  6. Mild aortic valve sclerosis, mild calcification of the proximal      ascending aortic wall.  7. Mild mitral valve thickening with no significant regurgitation.  8. Normal left ventricular chamber sizes.  She has mild to moderate      concentric hypertrophy with EF around 50-55%.  9. Mild IVC dilatation.   Compared the echocardiogram, September 30, 2005, there has been no  significant change.      Thomas C. Verl Blalock, MD, [facility]  Electronically Signed     TCW/MEDQ  D:  02/24/2006  T:  02/24/2006  Job:  [id number]   cc:   Tesfaye D. Legrand Rams, MD  Fax: [phone]

## 2010-07-18 NOTE — H&P (Signed)
   [name], [name]                        ACCOUNT NO.:  [id number]   MEDICAL RECORD NO.:  [id number]                   PATIENT TYPE:  AMB   LOCATION:  DAY                                  FACILITY:  [facility]   PHYSICIAN:  Leane Para C. Tamala Julian, M.D.                DATE OF BIRTH:  Oct 11, 1950   DATE OF ADMISSION:  DATE OF DISCHARGE:                                HISTORY & PHYSICAL   HISTORY OF PRESENT ILLNESS:  [age]-year-old female with a history of onset  of diffuse lymphadenopathy in early June.  CT scan revealed bilateral  submandibular and cervical adenopathy, right axillary adenopathy,  mediastinal adenopathy, without evidence of adenopathy in the abdomen.  She  does have evidence of bilateral iliac and common femoral adenopathy.  This  pattern is felt to be compatible with lymphoma and the patient is going to  have right axillary node biopsies for verification.   PAST HISTORY:  The patient has chronic headaches, hypertension, constipation  and depression.   MEDICATIONS:  1. Tarka 4/240 mg daily.  2. Remeron SolTabs 30 mg daily.  3. Zoloft 50 mg daily.  4. Prilosec OTC 20 mg b.i.d.   REVIEW OF SYSTEMS:  Review of systems is positive for intermittent fecal  incontinence.   PHYSICAL EXAMINATION:  VITAL SIGNS:  On exam, blood pressure 138/84, pulse  82, respirations 20, weight 150 pounds.  HEENT:  Unremarkable.  NECK:  Diffuse nodes of posterior cervical, submental, supraclavicular area.  CHEST:  Chest clear to auscultation.  HEART:  Regular rate and rhythm without murmur, gallop or rub.  ABDOMEN:  Abdomen soft, nontender.  Positive hepatomegaly.  Normal bowel  sounds.  EXTREMITIES:  No cyanosis, clubbing or edema.  NEUROLOGIC:  No focal motor, sensory or cerebellar deficit.   IMPRESSION:  1. Diffuse lymphadenopathy suggestive of primary lymphoma.  2. Hypertension.  3. Depression.  4. Gastroesophageal reflux disease.  5.     Chronic headaches.  6. Depression with  anxiety.   PLAN:  The patient will have right axillary node dissection.                                               Vernon Prey. Tamala Julian, M.D.    LCS/MEDQ  D:  09/18/2002  T:  09/19/2002  Job:  [id number]

## 2010-07-18 NOTE — Group Therapy Note (Signed)
[name], [name]              ACCOUNT NO.:  [id number]   MEDICAL RECORD NO.:  [id number]          PATIENT TYPE:  INP   LOCATION:  A341                          FACILITY:  [facility]   PHYSICIAN:  Edward L. Luan Pulling, M.D.DATE OF BIRTH:  18-Jan-1951   DATE OF PROCEDURE:  DATE OF DISCHARGE:                                 PROGRESS NOTE   Patient of Dr. Josephine Cables.   [name] overall, I think, is a little bit better.  I have now  reviewed her films serially, and it appears to be that what is happening  is she gets more of the right lower lobe infiltrate when she has not had  dialysis, and then when she gets dialyzed it improves.  Dr. Legrand Rams says  that she actually has had a low CD4 count which is contrary to what I  had seen in her chart.  At any rate, she seems to be improving slowly.  I think the biggest change in her x-ray is related to dialysis and  removal of fluids.  I think we should continue with her treatment and  follow.      Edward L. Luan Pulling, M.D.  Electronically Signed     ELH/MEDQ  D:  04/19/2006  T:  04/20/2006  Job:  [id number]

## 2010-07-18 NOTE — H&P (Signed)
NAMEIMONIE, SPELL                          ACCOUNT NO.:  [id number]   MEDICAL RECORD NO.:  [id number]                  PATIENT TYPE:   LOCATION:                                       FACILITY:   PHYSICIAN:  Vernon Prey. Tamala Julian, M.D.                DATE OF BIRTH:   DATE OF ADMISSION:  05/28/2003  DATE OF DISCHARGE:                                HISTORY & PHYSICAL   This is a 60 year old female with a history of recurrent abdominal pain,  nausea and vomiting. Her abdominal pain has been persistent for the past 3  or 4 weeks. She has a history of pancreatitis in the past.  The pain has  gotten much worse over the past 3 days.  She has developed some diarrhea  which started on the day prior to admission.  She is scheduled for admission  because of documented 10-pound weight loss.   PAST HISTORY:  She has positive HIV, but with good RNA __________ levels and  CD-4 levels. She has chronic bronchitis, hypertension and situational  depression.   MEDICATIONS:  1. Tarka 4/240 daily.  2. Xanax 0.5 mg t.i.d.  3. Diflucan 100 mg daily.  4. Demerol 50 mg q.4h. p.r.n.  5. Lexapro 20 mg daily.   SURGERY:  Right axillary node dissection.   REVIEW OF SYSTEMS:  Unremarkable except for mild depression, generalized  aches, and as noted in the history of present illness.   ALLERGIES:  PENICILLIN.   PHYSICAL EXAMINATION:  VITAL SIGNS:  Blood pressure is 116/90, pulse 96,  respirations 18, weight 134 pounds.  HEENT:  Unremarkable.  There is no jaundice.  NECK:  Supple. There is a small left anterior and submental node.  She also  has a right posterior lower neck node.  No thyromegaly is noted.  CHEST:  Clear to auscultation.  No rales, rubs, rhonchi or wheezes.  HEART:  Regular rate and rhythm without murmur, gallop, or rub.  ABDOMEN:  Moderate epigastric and left upper quadrant tenderness, slightly  hyperactive bowel sounds.  No palpable masses.  Mild left lower quadrant  tenderness.  PELVIC:   Was done on March 17 and was not repeated.  Pelvic exam was  unremarkable except for mild tenderness of the cervix.  RECTAL:  Not repeated.  EXTREMITIES:  No cyanosis, clubbing or edema.  NEUROLOGIC:  Exam was unremarkable though psychiatrically the patient has  evidence of depression.   IMPRESSION:  1. Acute abdomen with probable acute exacerbation of chronic pancreatitis.  2. Chronic bronchitis.  3. Hypertension.  4. Human immunodeficiency virus positive state.  5. Depression.   PLAN:  The patient will be admitted. She will be started on IV fluids and  antiemetics.  We will get stool cultures, check her baseline labs and will  probably do upper abdominal ultrasound if symptoms do not quickly resolve.     ___________________________________________  Vernon Prey. Tamala Julian, M.D.   LCS/MEDQ  D:  06/01/2003  T:  06/01/2003  Job:  [id number]

## 2010-07-18 NOTE — Discharge Summary (Signed)
[name], [name]              ACCOUNT NO.:  [id number]   MEDICAL RECORD NO.:  [id number]          PATIENT TYPE:  INP   LOCATION:  A202                          FACILITY:  [facility]   PHYSICIAN:  Tesfaye D. Legrand Rams, MD   DATE OF BIRTH:  February 10, 1951   DATE OF ADMISSION:  02/21/2006  DATE OF DISCHARGE:  12/28/2007LH                               DISCHARGE SUMMARY   DISCHARGE DIAGNOSES:  1. Gram positive bacteremia.  2. End-stage renal disease, on hemodialysis.  3. Human immunodeficiency virus disease with low CD4 count.  4. Hypokalemia.  5. Hypertension.  6. Urinary tract infection.  7. Hirsutism.  8. Multiple electrolyte abnormalities.  9. Anemia.  10.Thrombocytopenia.   DISCHARGE MEDICATIONS:  1. Septra DS 1 tablet p.o. daily.  2. Levaquin 500 mg q.24h.  3. Labetalol 100 mg b.i.d.  4. Catapres-TTS weekly.  5. Nexium 40 mg daily.  6. Renagel 800 mg b.i.d.   DISPOSITION:  Patient was discharged home in stable condition.   HOSPITAL COURSE:  This is a 60 year old female patient with a history of  multiple medical illnesses, who was admitted due to fever and shortness  of breath.  The patient is a known case of end-stage renal failure and  HIV disease.  She has been receiving her hemodialysis; however, the  patient has not kept her appointment for infectious disease for her HIV  treatment.  She was admitted due to fever, nausea and vomiting.  She had  gram positive cocci in her blood.  Patient was admitted and continued on  IV antibiotics.  She was also started on Septra for PCP  prophylaxis.  Patient was strongly advised to keep her appointment with  infectious clinic to resume followup and to start on HIV treatment.  The  patient was also seen by a nephrologist, and she received her  hemodialysis.  She was discharged home in stable condition to continue  followup as outpatient.      Tesfaye D. Legrand Rams, MD  Electronically Signed     TDF/MEDQ  D:  03/25/2006  T:  03/25/2006   Job:  [id number]

## 2010-07-18 NOTE — Group Therapy Note (Signed)
[name], [name]              ACCOUNT NO.:  [id number]   MEDICAL RECORD NO.:  [id number]          PATIENT TYPE:  INP   LOCATION:  A341                          FACILITY:  [facility]   PHYSICIAN:  Edward L. Luan Pulling, M.D.DATE OF BIRTH:  [birth year]   DATE OF PROCEDURE:  DATE OF DISCHARGE:                                 PROGRESS NOTE   Ms. Whiteaker is overall doing about the same.  She is still coughing and  congested she says, but I think she looks a little bit better.   PHYSICAL EXAMINATION:  VITAL SIGNS:  Temperature 97, pulse 60,  respirations 20, blood pressure 110/58.  CHEST:  Clearer.   Blood gas:  PO2 108, PCO2 of 45, pH 7.41.   ASSESSMENT:  She is I think a little better.  Plan is to continue with  her current treatments and medications since she seems to have improved,  and we will plan to follow from there.      Edward L. Luan Pulling, M.D.  Electronically Signed     ELH/MEDQ  D:  04/15/2006  T:  04/15/2006  Job:  [id number]

## 2010-07-18 NOTE — Discharge Summary (Signed)
NAMEEDID, FUSTER              ACCOUNT NO.:  [id number]   MEDICAL RECORD NO.:  [id number]          PATIENT TYPE:  INP   LOCATION:  A228                          FACILITY:  [facility]   PHYSICIAN:  Tesfaye D. Legrand Rams, MD   DATE OF BIRTH:  [birth year]   DATE OF ADMISSION:  12/23/2005  DATE OF DISCHARGE:  10/27/2007LH                                 DISCHARGE SUMMARY   DISCHARGE DIAGNOSES:  1. Hypertensive crisis  2. End-stage renal failure on hemodialysis.  3. Human immunodeficiency virus positive.  4. Depression disorder.   DISCHARGE MEDICATIONS:  1. Labetalol 400 mg p.o. b.i.d.  2. Minoxidil 5 mg b.i.d.  3. Catapres-TTS three weekly.  4. Nexium 40 mg daily.  5. Renagel 800 mg 2 tablets p.o. b.i.d.   DISPOSITION:  The patient was discharged to home in stable condition.   HOSPITAL COURSE:  This is a 60 year old female patient with history of  multiple medical illnesses including end-stage renal disease and HIV  positive.  The patient was admitted due to hypertensive crisis.  On  admission, her blood pressure was 220/130.  The patient was treated with IV  hydralazine.  The patient was not taking her medication before admission.  She was restarted on her oral medication.  Her blood pressure was controlled  and the patient was strongly advised to continue her regular medications.  She was discharged to home in stable condition.      Tesfaye D. Legrand Rams, MD  Electronically Signed     TDF/MEDQ  D:  01/18/2006  T:  01/18/2006  Job:  [id number]

## 2010-07-18 NOTE — H&P (Signed)
[name], [name]              ACCOUNT NO.:  [id number]   MEDICAL RECORD NO.:  [id number]          PATIENT TYPE:  INP   LOCATION:  IC03                          FACILITY:  [facility]   PHYSICIAN:  Edward L. Luan Pulling, M.D.DATE OF BIRTH:  [birth year]   DATE OF ADMISSION:  12/23/2005  DATE OF DISCHARGE:  LH                                HISTORY & PHYSICAL   REASON FOR ADMISSION:  Hypertension.   HISTORY:  [name] is a 60 year old who has a long known history of  chronic renal failure, hypertension and who has been on chronic dialysis.  She was in at her dialysis unit and they could not get her blood pressure  down and she was brought to the emergency room for evaluation.  In the  emergency room she has had multiple medications and none of them have  brought her blood pressure down; she still remains in the 220/130 range.   PAST MEDICAL HISTORY:  Positive for chronic renal insufficiency related to  multiple problems, probably hypertension.  She also has a history of HIV  disease and is being followed by Infectious Disease in [place].  Her last  hospitalization here was for an MRSA sepsis and she was discharged then on 01 October 2005 and was on Catapres-TTS three weekly, labetalol 400 mg b.i.d.,  Bactrim one daily, Nexium 40 mg daily, Renagel 800 mg two tablets b.i.d.  with meals.  Her past medical history in addition is positive for an episode  of pancreatitis.  She has a history of depression in the past.  Surgically,  she has had a hysterectomy.   SOCIAL HISTORY:  She stopped smoking many years ago.  She does have a  history of alcohol use, but none recently, and no illicit drug use.   FAMILY HISTORY:  Mother died when she was young, she does not know the  cause, and she is not sure about any of her other family history.   REVIEW OF SYSTEMS:  Otherwise is pretty much negative.   PHYSICAL EXAM:  VITAL SIGNS:  As mentioned, her blood pressure is very high  here in the  emergency room.  Her pulse is about 70.  HEENT:  Her pupils do  react.  I cannot see her fundi well, but she may have some muddiness of the  posterior fundi, but it is hard to tell.  Her mucous membranes are moist.  NECK:  Supple without masses.  CHEST:  Clear without wheezes.  HEART:  Regular.  She has an S4 gallop.  ABDOMEN:  Soft without any masses.  Bowel sounds present and active.  EXTREMITIES:  Showed no edema.   LABORATORY WORK:  BMET:  Potassium is 3.1, BUN is 6, creatinine 3.9 and  sodium is 128.  CBC shows white count of 4400, hemoglobin is 11.1, platelets  101,000.  Cardiac markers:  Myoglobin is greater than 500, troponin less  than 0.05.   ASSESSMENT AND PLAN:  She has the hypertensive urgency.  Plan is to try and  see if we can get her cleared up.  I am going to go  ahead and put her on  nitroglycerin.  She is going to continue with her other medications.  Her CT  done showed increased hilar enlargement, suggesting pulmonary artery  hypertension and bilateral hilar adenopathy, I suspect related to her human  immunodeficiency virus disease.  She had right heart pressure elevation,  hepatosplenomegaly, ascites, dilated inferior vena cava and hepatic veins,  ground-glass attenuation  of the lungs felt to represent pulmonary edema; I  will go ahead and give her some Lasix as well.  Plan then is for intensive  care unit admission, continue with her regular medications, give her some  Lasix, start her on nitroglycerin and try to get her blood pressure down and  repeat cardiac enzymes, and so forth.      Edward L. Luan Pulling, M.D.  Electronically Signed     Jasper Loser. Luan Pulling, M.D.  Electronically Signed    ELH/MEDQ  D:  12/23/2005  T:  12/24/2005  Job:  [id number]

## 2010-07-18 NOTE — Discharge Summary (Signed)
[name], [name]              ACCOUNT NO.:  [id number]   MEDICAL RECORD NO.:  [id number]          PATIENT TYPE:  INP   LOCATION:  A341                          FACILITY:  [facility]   PHYSICIAN:  Tesfaye D. Legrand Rams, MD   DATE OF BIRTH:  1950/05/26   DATE OF ADMISSION:  04/11/2006  DATE OF DISCHARGE:  02/19/2008LH                               DISCHARGE SUMMARY   DISCHARGE DIAGNOSES:  1. Pneumonia.  2. Acquired immune deficiency syndrome.  3. Diarrhea.  4. End-stage renal disease on hemodialysis.  5. Hypertension.  6. Anemia.  7. Gastroesophageal reflux disease.   DISCHARGE MEDICATIONS:  1. Renagel 800 mg 2 tablets with meals.  2. Labetalol 300 mg b.i.d.  3. Minoxidil 2.5 mg 2 tablets b.i.d.  4. Nexium 40 mg p.o. daily.  5. Renovite 1 tablet p.o. daily.  6. Zidovudine 2 mL p.o. Monday, Wednesday, Friday after hemodialysis.  7. Epivir 2.5 mL daily p.o. daily.  8. Albuterol MDI 2 puffs q.i.d.  9. Clonidine 0.3 mg patch change q. Weekly.  10.Kaletra 2 tablets b.i.d.   DISPOSITION:  The patient is discharged to home in stable condition.   HOSPITAL COURSE:  This is 60 year old female patient with history of  multiple medical illnesses including AIDS and end-stage renal failure.  Was admitted  due to nausea, vomiting and fever.  The patient was found  to have pneumonia and diarrhea.  She was started on IV antibiotics.  Over the hospital stay, the patient improved.  She had sputum for acid-  fast bacilli which was negative.  Her fever and respiratory symptoms  improved.  The patient was discharged home to continue her outpatient  hemodialysis.      Tesfaye D. Legrand Rams, MD  Electronically Signed    TDF/MEDQ  D:  05/17/2006  T:  05/17/2006  Job:  [id number]

## 2010-07-18 NOTE — Discharge Summary (Signed)
[name], [name]                        ACCOUNT NO.:  [id number]   MEDICAL RECORD NO.:  [id number]                   PATIENT TYPE:  INP   LOCATION:  [id number]                                 FACILITY:  [facility]   PHYSICIAN:  Vernon Prey. Tamala Julian, M.D.                DATE OF BIRTH:  01-09-1951   DATE OF ADMISSION:  05/28/2003  DATE OF DISCHARGE:  06/04/2003                                 DISCHARGE SUMMARY   DISCHARGE DIAGNOSES:  1. Acute pancreatitis.  2. Gallbladder dysfunction with gallbladder sludge and possible small stones     contributing to #1.  3. Chronic bronchitis.  4. Hypertension.  5. Human immunodeficiency virus (HIV) infection.  6. Depression.   DISPOSITION:  The patient is discharged home in stable, improved condition.   DISCHARGE MEDICATIONS:  1. Lexapro 20 mg p.o. daily.  2. Tarka 4/240 daily.  3. Xanax 0.5 mg t.i.d.  4. Diflucan 100 mg daily x2 weeks.  5. Imodium 2 mg q.4h. p.r.n. for diarrhea.   FOLLOW UP:  The patient will be seen in the office in 2 weeks.   HISTORY AND PHYSICAL:  A 60 year old female with a history of recurrent  abdominal pain, nausea, and vomiting.  She was symptomatic for the past 4  weeks.  She has a history of pancreatitis.  Her pain became much worse 3  days prior to admission.  She developed diarrhea.  She was seen in the  office where it was documented that she had a 10-pound weight loss over a 2-  week period.  Significant history is that she has positive HIV,  hypertension, and bronchitis.   PHYSICAL EXAMINATION:  VITAL SIGNS:  Stable.  She was mildly tachycardic  with a pulse of 96.  She was afebrile.  GENERAL:  There was no evidence of jaundice.  She had small left anterior  and submental nodes and a right posterior lower neck node.  LUNGS:  Clear.  ABDOMEN:  Revealed moderate epigastric and left upper quadrant tenderness  with mild left lower quadrant tenderness.   HOSPITAL COURSE:  The patient was admitted, started on IV fluids  and  antiemetics.  Because of diarrhea, stool cultures were obtained and these  were negative for routine enteropathogens.  Amylase and lipase were elevated  and returned to normal during this hospitalization.  Ultrasound of the upper  abdomen revealed gallbladder sludge, but without evidence of acute  cholecystitis.  The patient responded to treatment.  After her nausea and  vomiting  resolved, her diet was slowly advanced and she was rapidly advanced to  regular diet.  She tolerated this well.  By the 3rd, she was stable.  Vital  signs were stable, she was afebrile.  She was discharged home on the 4th in  satisfactory condition.     ___________________________________________  Vernon Prey. Tamala Julian, M.D.   LCS/MEDQ  D:  06/30/2003  T:  06/30/2003  Job:  [id number]

## 2010-07-18 NOTE — Group Therapy Note (Signed)
NAMETAMARI, [name]              ACCOUNT NO.:  [id number]   MEDICAL RECORD NO.:  [id number]          PATIENT TYPE:  INP   LOCATION:  A341                          FACILITY:  [facility]   PHYSICIAN:  Edward L. Luan Pulling, M.D.DATE OF BIRTH:  August 30, 1950   DATE OF PROCEDURE:  DATE OF DISCHARGE:                                 PROGRESS NOTE   [name] overall I think is about the same.  She is improving some.  She has no new complaints.  Her final AFB smear is not back yet.  I am  going to ask her to go ahead and get another chest x-ray to see if her  pneumonia still looks as good as on the last film.  This was on April 14, 2006.  I do not plan to change anything else.      Edward L. Luan Pulling, M.D.  Electronically Signed     ELH/MEDQ  D:  04/16/2006  T:  04/16/2006  Job:  [id number]

## 2010-07-18 NOTE — Procedures (Signed)
NAMEJACINTA, [name]              ACCOUNT NO.:  [id number]   MEDICAL RECORD NO.:  [id number]          PATIENT TYPE:  INP   LOCATION:  [id number]                          FACILITY:  [facility]   PHYSICIAN:  Jacqulyn Ducking, M.D. [facility] BIRTH:  [birth year]   DATE OF PROCEDURE:  09/30/2005  DATE OF DISCHARGE:                                  ECHOCARDIOGRAM   REFERRING:  Dr. Legrand Rams and Dr. Lowanda Foster.   CLINICAL DATA:  A 60 year old woman with pneumonia, HIV and acute renal  insufficiency; evaluate for endocarditis.   Aorta 2.8, left atrium 3.4, septum 1.2, posterior wall 1.1, LV diastole 4.4,  LV systole 3.3, RV diastole 3.5.   1.  Technically adequate echocardiographic study.  2.  Normal left atrial size; mild right atrial enlargement.  3.  Mild right ventricular dilatation with mild hypertrophy and mild      systolic dysfunction.  4.  Normal pulmonic valve and proximal pulmonary artery.  5.  Normal tricuspid valve with physiologic regurgitation; estimated RV      systolic pressure is very mildly elevated.  6.  Minimal aortic valvular sclerosis; mild calcification of the proximal      ascending aortic wall.  7.  Mild mitral valve thickening and annular calcification; trivial      regurgitation.  8.  Normal left ventricular size; mild concentric hypertrophy; low normal      regional and global function.  9.  Mild IVC dilatation; the decrease in diameter with inspiration is      blunted.      Jacqulyn Ducking, M.D. [facility]  Electronically Signed     RR/MEDQ  D:  09/30/2005  T:  10/01/2005  Job:  [id number]

## 2010-11-12 ENCOUNTER — Encounter: Payer: Self-pay | Admitting: Infectious Diseases

## 2010-11-12 ENCOUNTER — Ambulatory Visit (INDEPENDENT_AMBULATORY_CARE_PROVIDER_SITE_OTHER): Payer: Medicare Other | Admitting: Infectious Diseases

## 2010-11-12 DIAGNOSIS — B20 Human immunodeficiency virus [HIV] disease: Secondary | ICD-10-CM

## 2010-11-12 DIAGNOSIS — N186 End stage renal disease: Secondary | ICD-10-CM

## 2010-11-12 NOTE — Assessment & Plan Note (Signed)
She appears to be doing well. Await her CD4 and VL from her SNF. She has gotten flu shot. Offered condoms, hopefully not sexually active at [facility]. rtc 4-5 months.

## 2010-11-12 NOTE — Progress Notes (Signed)
  Subjective:    Patient ID: [name], female    DOB: April 25, 1950, 60 y.o.   MRN: [id number]  HPI 60 yo F with HIV+ since 7-04, Pancreatitis, ESRD, prev ETOHism. She is currently living in SNF. Currently taking ISN/3TC/AZT. Denies missed. Some she has to take crushed in applesauce. Still going to HD now MWF.      Review of Systems  Constitutional: Negative for fever, chills and appetite change.  Respiratory: Negative for cough and shortness of breath.        Objective:   Physical Exam  Constitutional: She appears well-developed and well-nourished.  Eyes: EOM are normal. Pupils are equal, round, and reactive to light.  Neck: Neck supple.  Cardiovascular: Normal rate, regular rhythm and normal heart sounds.   Pulmonary/Chest: Effort normal and breath sounds normal.  Abdominal: Soft. Bowel sounds are normal. She exhibits no distension.  Skin:       RUE fistula- non-tender, + bruit, no fluctuance.           Assessment & Plan:

## 2010-11-12 NOTE — Assessment & Plan Note (Signed)
Greatly appreciate the partnering of [facility].

## 2010-11-19 ENCOUNTER — Emergency Department (HOSPITAL_COMMUNITY)
Admission: EM | Admit: 2010-11-19 | Discharge: 2010-11-20 | Disposition: A | Discharge status: Home / Self Care | Payer: Medicare Other | Attending: Emergency Medicine | Admitting: Emergency Medicine

## 2010-11-19 DIAGNOSIS — F329 Major depressive disorder, single episode, unspecified: Secondary | ICD-10-CM | POA: Insufficient documentation

## 2010-11-19 DIAGNOSIS — I519 Heart disease, unspecified: Secondary | ICD-10-CM | POA: Insufficient documentation

## 2010-11-19 DIAGNOSIS — Z21 Asymptomatic human immunodeficiency virus [HIV] infection status: Secondary | ICD-10-CM | POA: Insufficient documentation

## 2010-11-19 DIAGNOSIS — N186 End stage renal disease: Secondary | ICD-10-CM | POA: Insufficient documentation

## 2010-11-19 DIAGNOSIS — F3289 Other specified depressive episodes: Secondary | ICD-10-CM | POA: Insufficient documentation

## 2010-11-19 DIAGNOSIS — R059 Cough, unspecified: Secondary | ICD-10-CM | POA: Insufficient documentation

## 2010-11-19 DIAGNOSIS — R05 Cough: Secondary | ICD-10-CM | POA: Insufficient documentation

## 2010-11-19 DIAGNOSIS — R070 Pain in throat: Secondary | ICD-10-CM | POA: Insufficient documentation

## 2010-11-19 DIAGNOSIS — Z79899 Other long term (current) drug therapy: Secondary | ICD-10-CM | POA: Insufficient documentation

## 2010-11-19 DIAGNOSIS — I120 Hypertensive chronic kidney disease with stage 5 chronic kidney disease or end stage renal disease: Secondary | ICD-10-CM | POA: Insufficient documentation

## 2010-11-20 ENCOUNTER — Emergency Department (HOSPITAL_COMMUNITY): Payer: Medicare Other

## 2010-11-20 IMAGING — CR DG NECK SOFT TISSUE
1 series · 1 of 1 positions shown · non-contrast
Comparison: [DATE]

CLINICAL DATA: Throat pain.

NECK SOFT TISSUES - 1+ VIEW

[w soft tissue neck]
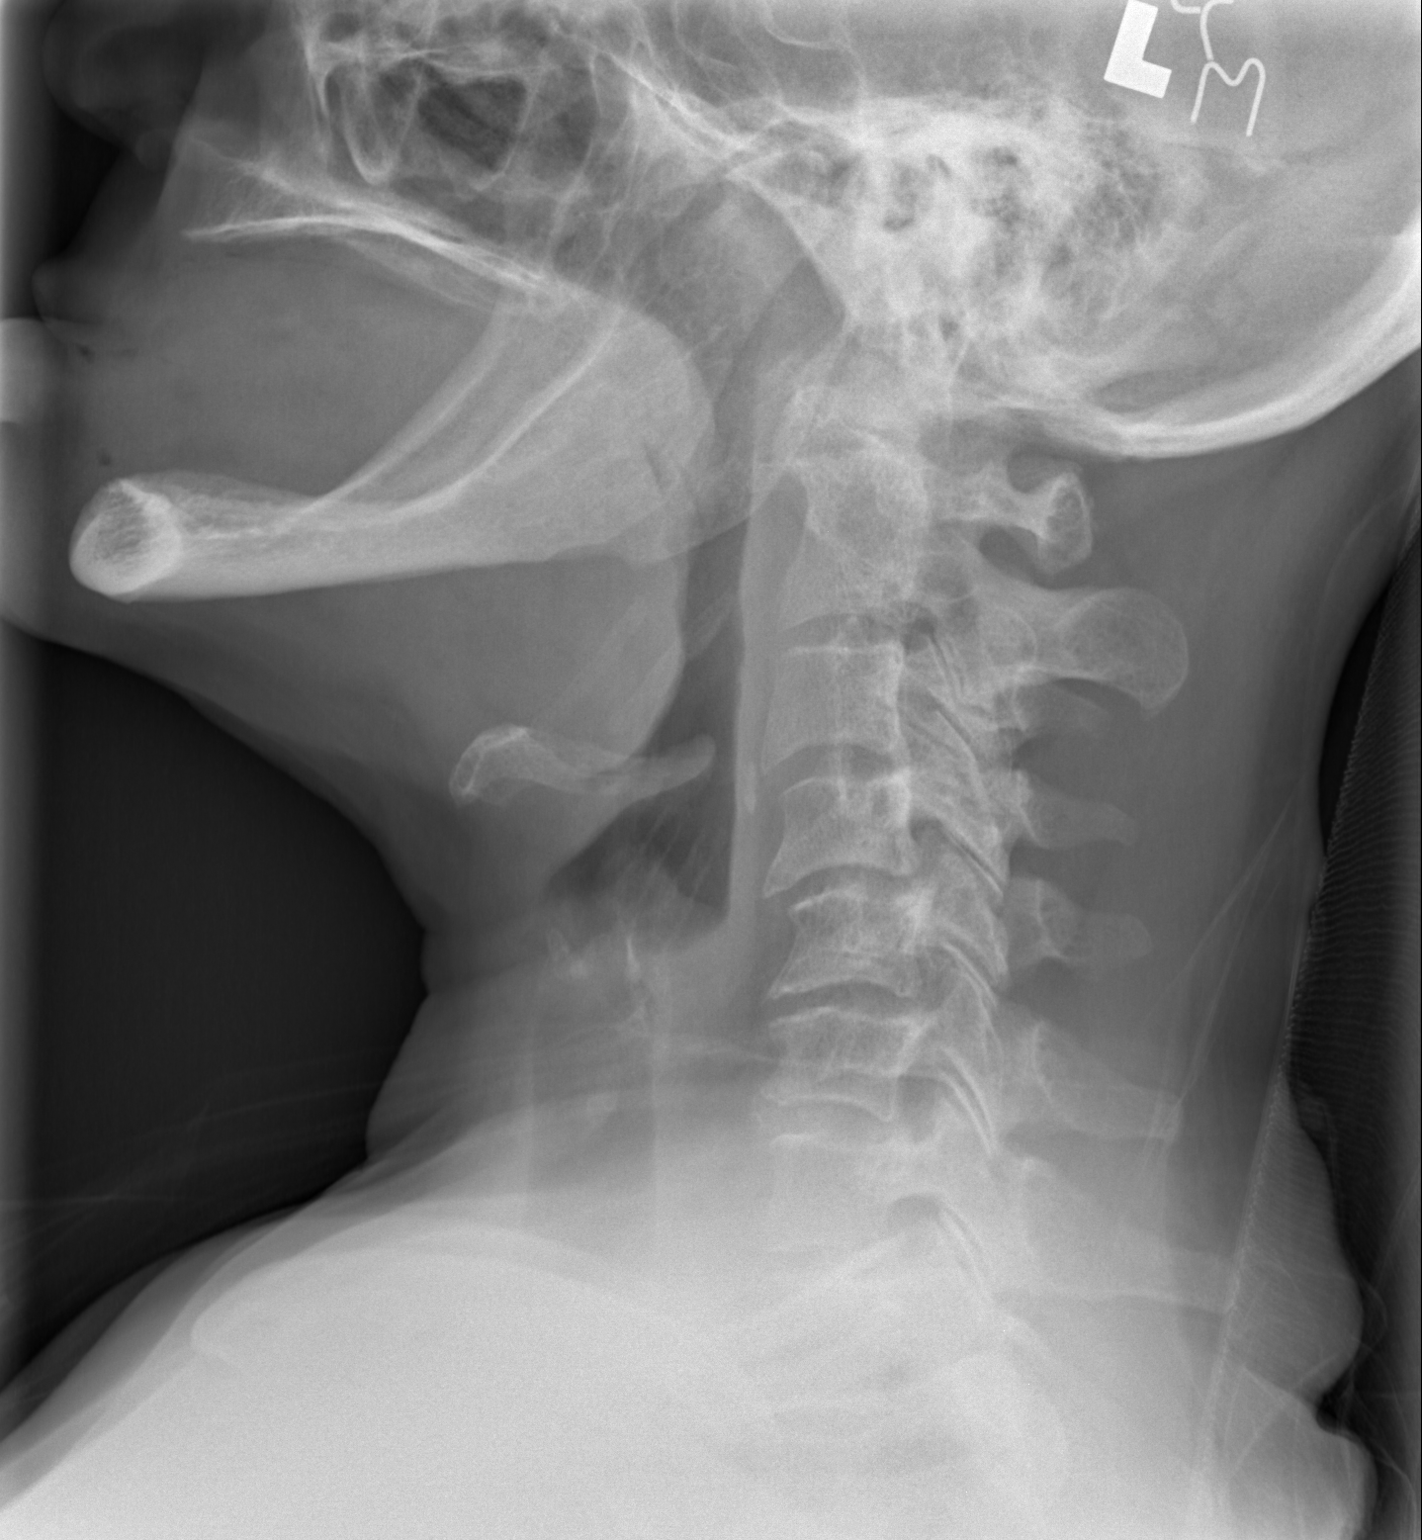

[1 of 1 positions shown; findings below may reference images not displayed]

FINDINGS: No radiopaque foreign body identified. Linear calcific
density anterior to the C2-3 disc space is likely degenerative. No
prevertebral soft tissue swelling or air.  Thyroid cartilage and
epiglottis have a normal radiographic appearance. Mild multilevel
degenerative changes of the cervical spine.  No acute osseous
abnormality.
IMPRESSION: No radiopaque foreign body or prevertebral soft tissue swelling
identified.

## 2010-11-21 LAB — T-HELPER CELL (CD4) - (RCID CLINIC ONLY)
CD4 % Helper T Cell: 18 — ABNORMAL LOW
CD4 T Cell Abs: 220 — ABNORMAL LOW

## 2010-11-25 ENCOUNTER — Encounter: Payer: Self-pay | Admitting: Internal Medicine

## 2010-11-26 LAB — T-HELPER CELL (CD4) - (RCID CLINIC ONLY)
CD4 % Helper T Cell: 20 — ABNORMAL LOW
CD4 T Cell Abs: 300 — ABNORMAL LOW

## 2010-12-01 LAB — BASIC METABOLIC PANEL
BUN: 20
BUN: 24 — ABNORMAL HIGH
BUN: 26 — ABNORMAL HIGH
BUN: 34 — ABNORMAL HIGH
CO2: 23
CO2: 24
CO2: 26
Calcium: 7.3 — ABNORMAL LOW
Calcium: 7.6 — ABNORMAL LOW
Calcium: 7.7 — ABNORMAL LOW
Calcium: 7.7 — ABNORMAL LOW
Chloride: 101
Chloride: 97
Creatinine, Ser: 6.27 — ABNORMAL HIGH
Creatinine, Ser: 7.76 — ABNORMAL HIGH
Creatinine, Ser: 8.66 — ABNORMAL HIGH
GFR calc Af Amer: 6 — ABNORMAL LOW
GFR calc Af Amer: 6 — ABNORMAL LOW
GFR calc Af Amer: 8 — ABNORMAL LOW
GFR calc non Af Amer: 5 — ABNORMAL LOW
GFR calc non Af Amer: 5 — ABNORMAL LOW
GFR calc non Af Amer: 7 — ABNORMAL LOW
Glucose, Bld: 82
Glucose, Bld: 84
Potassium: 4
Sodium: 134 — ABNORMAL LOW

## 2010-12-01 LAB — HEPATIC FUNCTION PANEL
AST: 43 — ABNORMAL HIGH
Albumin: 2.1 — ABNORMAL LOW
Albumin: 2.2 — ABNORMAL LOW
Alkaline Phosphatase: 65
Bilirubin, Direct: 0.5 — ABNORMAL HIGH
Bilirubin, Direct: 0.6 — ABNORMAL HIGH
Indirect Bilirubin: 0.8
Total Bilirubin: 1.3 — ABNORMAL HIGH
Total Bilirubin: 1.6 — ABNORMAL HIGH

## 2010-12-01 LAB — CBC
HCT: 39
Hemoglobin: 11.8 — ABNORMAL LOW
Hemoglobin: 12
MCHC: 32.3
MCV: 82.9
Platelets: 66 — ABNORMAL LOW
Platelets: 84 — ABNORMAL LOW
RBC: 4.41
RBC: 4.48
RDW: 18.8 — ABNORMAL HIGH
RDW: 18.9 — ABNORMAL HIGH

## 2010-12-01 LAB — DIFFERENTIAL
Basophils Absolute: 0
Basophils Absolute: 0
Basophils Relative: 0
Basophils Relative: 1
Eosinophils Absolute: 0
Eosinophils Absolute: 0
Eosinophils Relative: 0
Eosinophils Relative: 0
Lymphocytes Relative: 33
Lymphocytes Relative: 36
Lymphocytes Relative: 36
Lymphs Abs: 1.3
Lymphs Abs: 1.3
Monocytes Absolute: 0.4
Monocytes Absolute: 0.4
Monocytes Relative: 11
Neutro Abs: 1.5 — ABNORMAL LOW
Neutro Abs: 1.9
Neutrophils Relative %: 52

## 2010-12-01 LAB — COMPREHENSIVE METABOLIC PANEL
BUN: 28 — ABNORMAL HIGH
CO2: 26
Calcium: 8.3 — ABNORMAL LOW
Chloride: 93 — ABNORMAL LOW
Creatinine, Ser: 7.39 — ABNORMAL HIGH
GFR calc non Af Amer: 6 — ABNORMAL LOW
Glucose, Bld: 118 — ABNORMAL HIGH
Total Bilirubin: 1.7 — ABNORMAL HIGH

## 2010-12-01 LAB — STOOL CULTURE

## 2010-12-01 LAB — IGA: IgA: 22 — ABNORMAL LOW

## 2010-12-01 LAB — IGM: IgM, Serum: 369 — ABNORMAL HIGH

## 2010-12-01 LAB — IRON AND TIBC
Iron: 58
Saturation Ratios: 42
UIBC: 79

## 2010-12-01 LAB — OVA AND PARASITE EXAMINATION

## 2010-12-01 LAB — LIPASE, BLOOD
Lipase: 22
Lipase: 52

## 2010-12-01 LAB — CLOSTRIDIUM DIFFICILE EIA: C difficile Toxins A+B, EIA: NEGATIVE

## 2010-12-01 LAB — HEPATITIS C ANTIBODY: HCV Ab: NEGATIVE

## 2010-12-01 LAB — HEPATITIS B SURFACE ANTIBODY,QUALITATIVE: Hep B S Ab: POSITIVE — AB

## 2010-12-04 LAB — IGG, IGA, IGM
IgA: 30 mg/dL — ABNORMAL LOW (ref 68–378)
IgG (Immunoglobin G), Serum: 8910 mg/dL — ABNORMAL HIGH (ref 694–1618)

## 2010-12-04 LAB — PROTEIN ELECTROPH W RFLX QUANT IMMUNOGLOBULINS
Alpha-2-Globulin: 4.4 % — ABNORMAL LOW (ref 7.1–11.8)
Beta Globulin: 3.4 % — ABNORMAL LOW (ref 4.7–7.2)
Gamma Globulin: 59.4 % — ABNORMAL HIGH (ref 11.1–18.8)
M-Spike, %: 5.39 g/dL

## 2010-12-04 LAB — CBC
MCHC: 31.6 g/dL (ref 30.0–36.0)
RBC: 5.08 MIL/uL (ref 3.87–5.11)
RDW: 17.7 % — ABNORMAL HIGH (ref 11.5–15.5)

## 2010-12-05 LAB — T-HELPER CELL (CD4) - (RCID CLINIC ONLY): CD4 % Helper T Cell: 16 — ABNORMAL LOW

## 2010-12-10 ENCOUNTER — Other Ambulatory Visit (HOSPITAL_BASED_OUTPATIENT_CLINIC_OR_DEPARTMENT_OTHER): Payer: Self-pay | Admitting: Internal Medicine

## 2010-12-10 DIAGNOSIS — Z09 Encounter for follow-up examination after completed treatment for conditions other than malignant neoplasm: Secondary | ICD-10-CM

## 2010-12-11 LAB — BASIC METABOLIC PANEL
Chloride: 98
GFR calc Af Amer: 8 — ABNORMAL LOW
Potassium: 3.8
Sodium: 128 — ABNORMAL LOW

## 2010-12-11 LAB — CULTURE, RESPIRATORY W GRAM STAIN
Culture: NORMAL
Gram Stain: NONE SEEN

## 2010-12-11 LAB — CBC
HCT: 37.6
MCV: 81.8
RBC: 4.6
WBC: 3.8 — ABNORMAL LOW

## 2010-12-11 LAB — DIFFERENTIAL
Eosinophils Absolute: 0
Eosinophils Relative: 1
Lymphs Abs: 1.1
Monocytes Relative: 12 — ABNORMAL HIGH

## 2010-12-12 LAB — I-STAT 8, (EC8 V) (CONVERTED LAB)
Acid-Base Excess: 5 — ABNORMAL HIGH
BUN: 28 — ABNORMAL HIGH
Bicarbonate: 29.7 — ABNORMAL HIGH
Chloride: 103
Glucose, Bld: 85
HCT: 45
Hemoglobin: 15.3 — ABNORMAL HIGH
Operator id: 216461
Potassium: 4.3
Sodium: 140
TCO2: 31
pCO2, Ven: 41.7 — ABNORMAL LOW
pH, Ven: 7.46 — ABNORMAL HIGH

## 2010-12-12 LAB — POCT I-STAT CREATININE: Creatinine, Ser: 7.5 — ABNORMAL HIGH

## 2010-12-13 ENCOUNTER — Inpatient Hospital Stay (HOSPITAL_COMMUNITY)
Admission: EM | Admit: 2010-12-13 | Discharge: 2010-12-16 | DRG: 368 | Disposition: A | Discharge status: Skilled Nursing Facility | Payer: Medicare Other | Attending: Internal Medicine | Admitting: Internal Medicine

## 2010-12-13 ENCOUNTER — Emergency Department (HOSPITAL_COMMUNITY): Payer: Medicare Other

## 2010-12-13 ENCOUNTER — Other Ambulatory Visit: Payer: Self-pay

## 2010-12-13 ENCOUNTER — Encounter (HOSPITAL_COMMUNITY): Payer: Self-pay | Admitting: Emergency Medicine

## 2010-12-13 DIAGNOSIS — J189 Pneumonia, unspecified organism: Secondary | ICD-10-CM

## 2010-12-13 DIAGNOSIS — M25529 Pain in unspecified elbow: Secondary | ICD-10-CM

## 2010-12-13 DIAGNOSIS — D61811 Other drug-induced pancytopenia: Secondary | ICD-10-CM | POA: Diagnosis present

## 2010-12-13 DIAGNOSIS — I10 Essential (primary) hypertension: Secondary | ICD-10-CM | POA: Diagnosis present

## 2010-12-13 DIAGNOSIS — Z8639 Personal history of other endocrine, nutritional and metabolic disease: Secondary | ICD-10-CM

## 2010-12-13 DIAGNOSIS — K2210 Ulcer of esophagus without bleeding: Secondary | ICD-10-CM | POA: Diagnosis present

## 2010-12-13 DIAGNOSIS — K219 Gastro-esophageal reflux disease without esophagitis: Secondary | ICD-10-CM | POA: Diagnosis present

## 2010-12-13 DIAGNOSIS — R748 Abnormal levels of other serum enzymes: Secondary | ICD-10-CM

## 2010-12-13 DIAGNOSIS — R599 Enlarged lymph nodes, unspecified: Secondary | ICD-10-CM

## 2010-12-13 DIAGNOSIS — K859 Acute pancreatitis without necrosis or infection, unspecified: Secondary | ICD-10-CM

## 2010-12-13 DIAGNOSIS — N289 Disorder of kidney and ureter, unspecified: Secondary | ICD-10-CM

## 2010-12-13 DIAGNOSIS — Z8719 Personal history of other diseases of the digestive system: Secondary | ICD-10-CM

## 2010-12-13 DIAGNOSIS — F411 Generalized anxiety disorder: Secondary | ICD-10-CM | POA: Diagnosis present

## 2010-12-13 DIAGNOSIS — D61818 Other pancytopenia: Secondary | ICD-10-CM | POA: Diagnosis not present

## 2010-12-13 DIAGNOSIS — Z87891 Personal history of nicotine dependence: Secondary | ICD-10-CM

## 2010-12-13 DIAGNOSIS — I120 Hypertensive chronic kidney disease with stage 5 chronic kidney disease or end stage renal disease: Secondary | ICD-10-CM | POA: Diagnosis present

## 2010-12-13 DIAGNOSIS — F1010 Alcohol abuse, uncomplicated: Secondary | ICD-10-CM

## 2010-12-13 DIAGNOSIS — Z862 Personal history of diseases of the blood and blood-forming organs and certain disorders involving the immune mechanism: Secondary | ICD-10-CM

## 2010-12-13 DIAGNOSIS — Z992 Dependence on renal dialysis: Secondary | ICD-10-CM

## 2010-12-13 DIAGNOSIS — I2789 Other specified pulmonary heart diseases: Secondary | ICD-10-CM | POA: Diagnosis present

## 2010-12-13 DIAGNOSIS — R229 Localized swelling, mass and lump, unspecified: Secondary | ICD-10-CM | POA: Diagnosis present

## 2010-12-13 DIAGNOSIS — Z87898 Personal history of other specified conditions: Secondary | ICD-10-CM | POA: Diagnosis present

## 2010-12-13 DIAGNOSIS — K2100 Gastro-esophageal reflux disease with esophagitis, without bleeding: Secondary | ICD-10-CM | POA: Diagnosis present

## 2010-12-13 DIAGNOSIS — R197 Diarrhea, unspecified: Secondary | ICD-10-CM

## 2010-12-13 DIAGNOSIS — T375X5A Adverse effect of antiviral drugs, initial encounter: Secondary | ICD-10-CM | POA: Diagnosis present

## 2010-12-13 DIAGNOSIS — N186 End stage renal disease: Secondary | ICD-10-CM | POA: Diagnosis present

## 2010-12-13 DIAGNOSIS — B20 Human immunodeficiency virus [HIV] disease: Secondary | ICD-10-CM | POA: Diagnosis present

## 2010-12-13 DIAGNOSIS — F3289 Other specified depressive episodes: Secondary | ICD-10-CM | POA: Diagnosis present

## 2010-12-13 DIAGNOSIS — R51 Headache: Secondary | ICD-10-CM

## 2010-12-13 DIAGNOSIS — Z21 Asymptomatic human immunodeficiency virus [HIV] infection status: Secondary | ICD-10-CM

## 2010-12-13 DIAGNOSIS — F329 Major depressive disorder, single episode, unspecified: Secondary | ICD-10-CM | POA: Diagnosis present

## 2010-12-13 DIAGNOSIS — K226 Gastro-esophageal laceration-hemorrhage syndrome: Principal | ICD-10-CM | POA: Diagnosis present

## 2010-12-13 DIAGNOSIS — J209 Acute bronchitis, unspecified: Secondary | ICD-10-CM

## 2010-12-13 DIAGNOSIS — Z9079 Acquired absence of other genital organ(s): Secondary | ICD-10-CM

## 2010-12-13 DIAGNOSIS — D649 Anemia, unspecified: Secondary | ICD-10-CM | POA: Diagnosis present

## 2010-12-13 DIAGNOSIS — K922 Gastrointestinal hemorrhage, unspecified: Secondary | ICD-10-CM | POA: Diagnosis present

## 2010-12-13 DIAGNOSIS — I509 Heart failure, unspecified: Secondary | ICD-10-CM

## 2010-12-13 DIAGNOSIS — K439 Ventral hernia without obstruction or gangrene: Secondary | ICD-10-CM

## 2010-12-13 HISTORY — DX: Human immunodeficiency virus (HIV) disease: B20

## 2010-12-13 HISTORY — DX: Essential (primary) hypertension: I10

## 2010-12-13 HISTORY — DX: Noninfective gastroenteritis and colitis, unspecified: K52.9

## 2010-12-13 HISTORY — DX: Insomnia, unspecified: G47.00

## 2010-12-13 HISTORY — DX: Depression, unspecified: F32.A

## 2010-12-13 HISTORY — DX: Asymptomatic human immunodeficiency virus (hiv) infection status: Z21

## 2010-12-13 HISTORY — DX: Major depressive disorder, single episode, unspecified: F32.9

## 2010-12-13 HISTORY — DX: Pulmonary hypertension, unspecified: I27.20

## 2010-12-13 LAB — DIFFERENTIAL
Basophils Absolute: 0 10*3/uL (ref 0.0–0.1)
Basophils Relative: 0 % (ref 0–1)
Lymphocytes Relative: 20 % (ref 12–46)
Neutro Abs: 2.7 10*3/uL (ref 1.7–7.7)
Neutrophils Relative %: 70 % (ref 43–77)

## 2010-12-13 LAB — COMPREHENSIVE METABOLIC PANEL
ALT: 29 U/L (ref 0–35)
AST: 31 U/L (ref 0–37)
Albumin: 3.1 g/dL — ABNORMAL LOW (ref 3.5–5.2)
CO2: 30 mEq/L (ref 19–32)
Chloride: 95 mEq/L — ABNORMAL LOW (ref 96–112)
GFR calc non Af Amer: 5 mL/min — ABNORMAL LOW (ref >90)
Potassium: 3.6 mEq/L (ref 3.5–5.1)
Sodium: 135 mEq/L (ref 135–145)
Total Bilirubin: 0.4 mg/dL (ref 0.3–1.2)

## 2010-12-13 LAB — CBC
HCT: 39.5 % (ref 36.0–46.0)
Platelets: 87 10*3/uL — ABNORMAL LOW (ref 150–400)
RDW: 14.1 % (ref 11.5–15.5)
WBC: 3.8 10*3/uL — ABNORMAL LOW (ref 4.0–10.5)

## 2010-12-13 IMAGING — CR DG CHEST 1V PORT
1 series · 1 of 1 positions shown · non-contrast
Comparison: Portable exam [6K] hours compared to [DATE]

CLINICAL DATA: , abdominal pain, shortness of breath, chest pain,
weakness, chills, history hypertension, pulmonary hypertension, HIV

PORTABLE CHEST - 1 VIEW

[view not recorded]
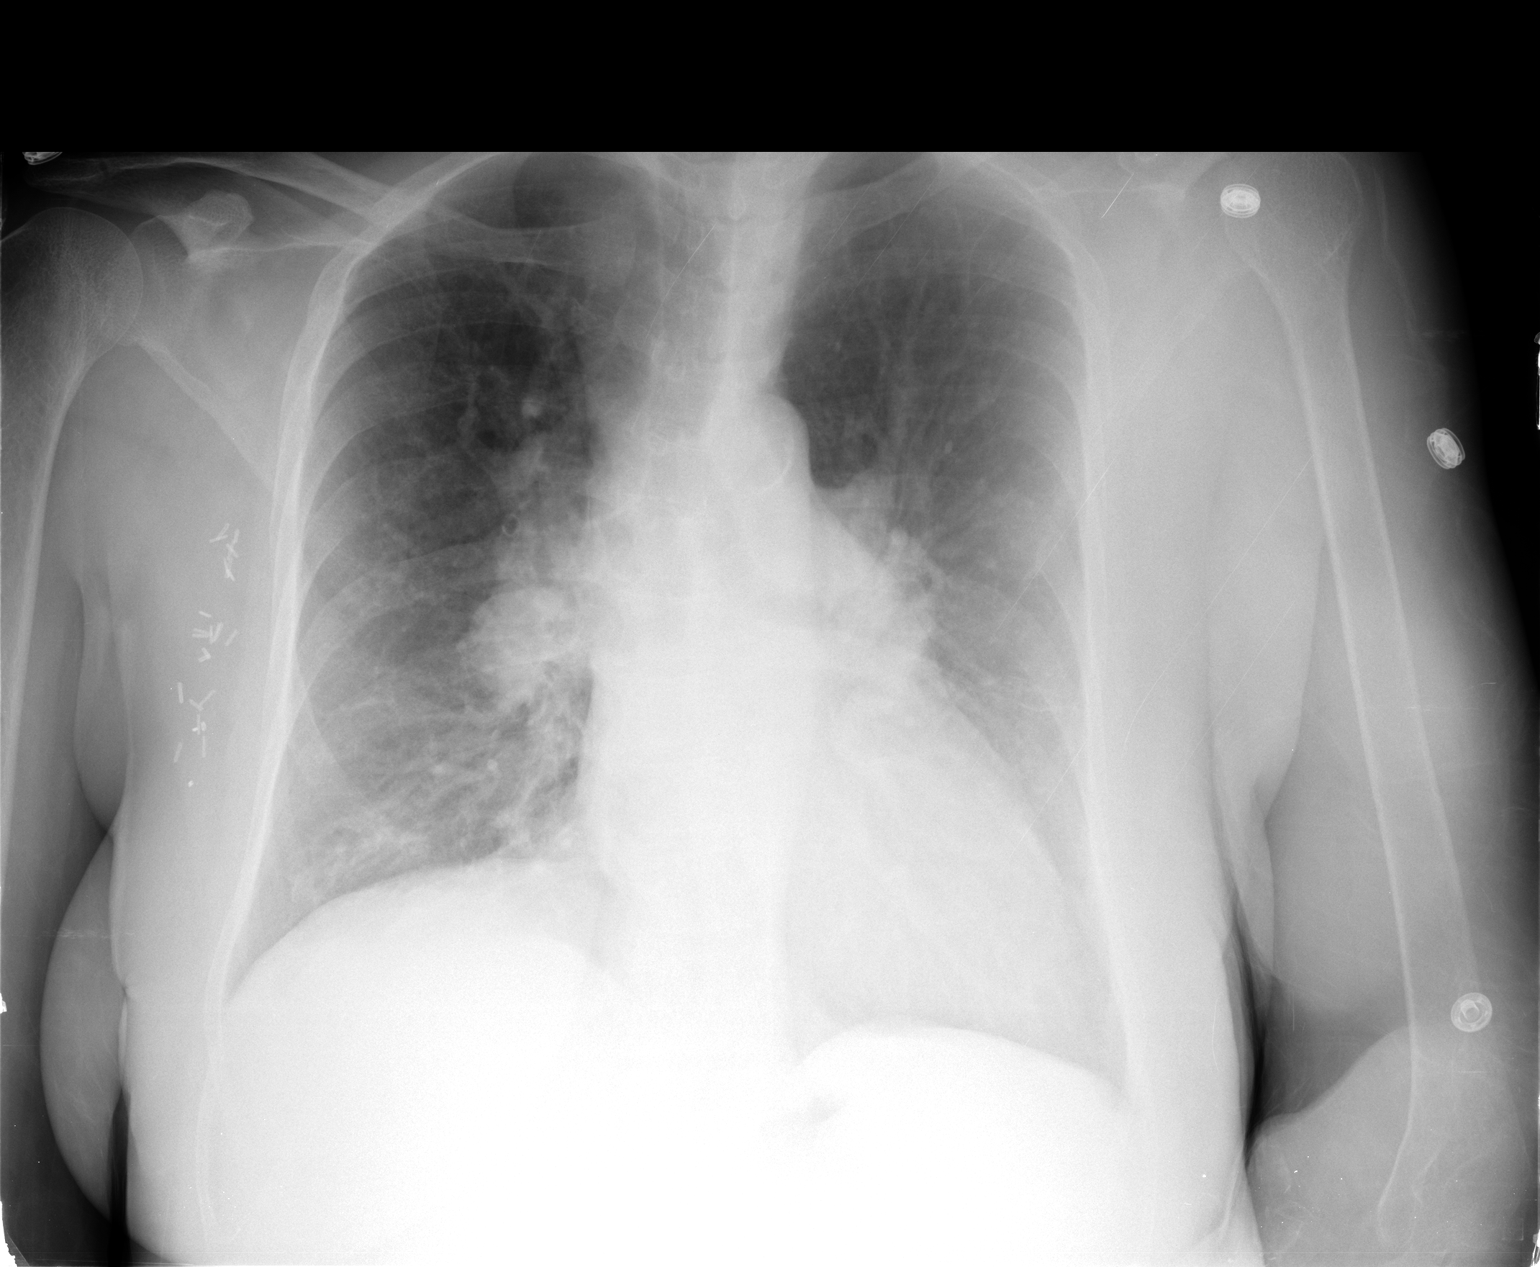

[1 of 1 positions shown; findings below may reference images not displayed]

FINDINGS: Mild enlargement of cardiac silhouette.
Atherosclerotic calcification aortic arch.
Bilateral hilar enlargement, may represent pulmonary arterial
hypertension but hilar adenopathy not excluded.
Minimal bronchitic changes and right basilar atelectasis.
Linear scarring left mid lung.
No definite acute infiltrate or pleural effusion.
Surgical clips right axilla.
Diffuse osseous demineralization.
IMPRESSION: Enlargement of cardiac silhouette with pulmonary venous
hypertension.
Bilateral hilar enlargement, a component which may be from
pulmonary arterial hypertension but hilar adenopathy not excluded.
Bronchitic changes with right basilar atelectasis and left mid lung
scarring.

## 2010-12-13 MED ORDER — SODIUM CHLORIDE 0.9 % IV BOLUS (SEPSIS)
500.0000 mL | Freq: Once | INTRAVENOUS | Status: AC
  Administered 2010-12-13: 500 mL via INTRAVENOUS

## 2010-12-13 MED ORDER — SODIUM CHLORIDE 0.9 % IV SOLN
1000.0000 mL | INTRAVENOUS | Status: AC
  Administered 2010-12-13: 1000 mL via INTRAVENOUS

## 2010-12-13 MED ORDER — ONDANSETRON HCL 4 MG/2ML IJ SOLN
4.0000 mg | Freq: Once | INTRAMUSCULAR | Status: AC
  Administered 2010-12-13: 4 mg via INTRAVENOUS
  Filled 2010-12-13: qty 2

## 2010-12-13 MED ORDER — SODIUM CHLORIDE 0.9 % IV SOLN
80.0000 mg | Freq: Once | INTRAVENOUS | Status: AC
  Administered 2010-12-13: 80 mg via INTRAVENOUS
  Filled 2010-12-13: qty 80

## 2010-12-13 MED ORDER — PANTOPRAZOLE SODIUM 40 MG IV SOLR
INTRAVENOUS | Status: AC
  Filled 2010-12-13: qty 80

## 2010-12-13 MED ORDER — FENTANYL CITRATE 0.05 MG/ML IJ SOLN
100.0000 ug | Freq: Once | INTRAMUSCULAR | Status: AC
  Administered 2010-12-13: 100 ug via INTRAVENOUS
  Filled 2010-12-13: qty 2

## 2010-12-13 NOTE — ED Notes (Signed)
Pt reports nausea and vomiting started around supper. EMS reports emesis positive for blood.

## 2010-12-13 NOTE — ED Provider Notes (Signed)
History     CSN: [id number] Arrival date & time: 12/13/2010  8:51 PM  Chief Complaint  Patient presents with  . Emesis    (Consider location/radiation/quality/duration/timing/severity/associated sxs/prior treatment) HPI This 60 year old female is a nursing home resident who has a history of end stage renal disease with dialysis who presents with about 3 hours of several episodes of hematemesis with both bright and dark red blood vomiting. She has intermittent abdominal pain which is sharp stabbing and fleeting only lasting a few seconds at a time in her epigastric area without radiation and is not present now. She has no chest pain or shortness of breath lightheadedness or syncope. She denies history of upper GI bleeding. She has no black or bloody stools and had no bowel movement today. She has no new localized weakness or numbness. She has baseline chronic severe back pain which is unchanged. When she has her abdominal pains they are sharp and stabbing and fleeting in the epigastric area without radiation only lasting a few seconds occurring several times over the last few hours. There was no treatment prior to arrival. Past Medical History  Diagnosis Date  . Pulmonary HTN   . Renal disorder   . Hypertension   . Depression   . Insomnia   . Chronic diarrhea   . HIV (human immunodeficiency virus infection)     Past Surgical History  Procedure Date  . Dialysis shunts     Family History  Problem Relation Age of Onset  . Anesthesia problems Neg Hx   . Hypotension Neg Hx   . Malignant hyperthermia Neg Hx   . Pseudochol deficiency Neg Hx     History  Substance Use Topics  . Smoking status: Never Smoker   . Smokeless tobacco: Never Used  . Alcohol Use: No    OB History    Grav Para Term Preterm Abortions TAB SAB Ect Mult Living   1 1 1       1       Review of Systems  Constitutional: Negative for fever.       10 Systems reviewed and are negative for acute change except  as noted in the HPI.  HENT: Negative for congestion.   Eyes: Negative for discharge and redness.  Respiratory: Negative for cough and shortness of breath.   Cardiovascular: Negative for chest pain.  Gastrointestinal: Positive for vomiting and abdominal pain. Negative for diarrhea and blood in stool.  Musculoskeletal: Negative for back pain.  Skin: Negative for rash.  Neurological: Negative for syncope, numbness and headaches.  Psychiatric/Behavioral:       No behavior change.    Allergies  Penicillins  Home Medications   Current Outpatient Rx  Name Route Sig Dispense Refill  . ACETAMINOPHEN 325 MG PO TABS Oral Take 325 mg by mouth every 4 (four) hours as needed.      Marland Kitchen NEPHRO-VITE 0.8 MG PO TABS Oral Take 0.8 mg by mouth at bedtime.      . CHOLESTYRAMINE 4 G PO PACK Oral Take by mouth 2 (two) times daily with a meal. 1/2 scoop     . EPOETIN ALFA 29562 UNIT/ML IJ SOLN Subcutaneous Inject 10,000 Units into the skin. To be given at dialysis     . FENTANYL 25 MCG/HR TD PT72 Transdermal Place 1 patch onto the skin every other day.      Marland Kitchen FLORA-Q PO CAPS Oral Take 1 capsule by mouth daily.      Marland Kitchen GABAPENTIN 300 MG PO CAPS  Oral Take 300 mg by mouth every 12 (twelve) hours.      Marland Kitchen HYPROMELLOSE 2.5 % OP SOLN Both Eyes Place 1 drop into both eyes 2 (two) times daily.      Marland Kitchen LAMIVUDINE 100 MG PO TABS Oral Take 0.5 tablets (50 mg total) by mouth daily. Take one half tablet daily 150 tablet 90  . LANTHANUM CARBONATE 500 MG PO CHEW  1,000 mg 3 (three) times daily with meals. Chew 1/2 tablet by mouth three times daily with snacks    . LIDOCAINE 5 % EX PTCH Transdermal Place 1 patch onto the skin daily. Remove & Discard patch within 12 hours or as directed by MD     . ONDANSETRON HCL 4 MG PO TABS  2 mg every 4 (four) hours as needed. nausea    . OXYCODONE HCL 5 MG PO CAPS Oral Take 5 mg by mouth 2 (two) times daily.      Marland Kitchen PARICALCITOL 5 MCG/ML IV SOLN Intravenous Inject into the vein.      Marland Kitchen  RALTEGRAVIR POTASSIUM 400 MG PO TABS Oral Take 400 mg by mouth 2 (two) times daily.      Marland Kitchen SEVELAMER CARBONATE 800 MG PO TABS Oral Take 800 mg by mouth 3 (three) times daily with meals.      Marland Kitchen SILDENAFIL CITRATE 20 MG PO TABS Oral Take 20 mg by mouth 3 (three) times daily.      Marland Kitchen ZIDOVUDINE 100 MG PO CAPS Oral Take 100 mg by mouth 3 (three) times daily.      . ALBUTEROL SULFATE HFA 108 (90 BASE) MCG/ACT IN AERS Inhalation Inhale 2 puffs into the lungs every 6 (six) hours as needed. Shortness of breath    . ALPRAZOLAM 0.5 MG PO TABS Oral Take 0.5 mg by mouth 2 (two) times daily as needed. anxiety    . HYDROCODONE-ACETAMINOPHEN 5-500 MG PO TABS Oral Take 1 tablet by mouth every 6 (six) hours as needed.      Marland Kitchen LANTHANUM CARBONATE 500 MG PO CHEW Oral Chew 500 mg by mouth 3 (three) times daily with meals.      Marland Kitchen PANTOPRAZOLE SODIUM 40 MG PO TBEC Oral Take 1 tablet (40 mg total) by mouth 2 (two) times daily before a meal.      BP 92/66  Pulse 72  Temp(Src) 98.3 F (36.8 C) (Oral)  Resp 18  Ht 5\' 2"  (1.575 m)  Wt 136 lb 3.9 oz (61.8 kg)  BMI 24.92 kg/m2  SpO2 95%  Physical Exam  Nursing note and vitals reviewed. Constitutional:       Awake, alert, nontoxic appearance.  HENT:  Head: Atraumatic.  Mouth/Throat: Oropharynx is clear and moist.  Eyes: Pupils are equal, round, and reactive to light. Right eye exhibits no discharge. Left eye exhibits no discharge.  Neck: Neck supple.  Cardiovascular: Normal rate and regular rhythm.   No murmur heard. Pulmonary/Chest: Breath sounds normal. No respiratory distress. She has no wheezes. She has no rales. She exhibits no tenderness.  Abdominal: Soft. Bowel sounds are normal. She exhibits no mass. There is no tenderness. There is no rebound and no guarding.  Genitourinary: Guaiac negative stool.       Chaperone was present for rectal examination which was nontender and reveals light brown Hemoccult trace heme positive stool with quality check  performed.  Musculoskeletal: She exhibits no tenderness.       Baseline ROM, no obvious new focal weakness.  Neurological: She is alert.  Mental status and motor strength appears baseline for patient and situation.  Skin: No rash noted.  Psychiatric: She has a normal mood and affect.    ED Course  Procedures (including critical care time)  [facility] will see the patient in the ED for admission.10:18 PM  The case was discussed with Dr. Sydell Axon from GI who will see the patient in the morning.  ECG: Sinus rhythm with premature atrial complexes with ventricular rate 84 beats per minute, left axis deviation, intervals are normal, nonspecific T wave abnormality with nonspecific ST segment changes, when compared with November 2011 the nonspecific ST and T-wave changes are less evident at this time. Labs Reviewed  CBC - Abnormal; Notable for the following:    WBC 3.8 (*)    RBC 3.71 (*)    MCV 106.5 (*)    Platelets 87 (*)    All other components within normal limits  COMPREHENSIVE METABOLIC PANEL - Abnormal; Notable for the following:    Chloride 95 (*)    Creatinine, Ser 8.30 (*)    Total Protein 10.2 (*)    Albumin 3.1 (*)    GFR calc non Af Amer 5 (*)    GFR calc Af Amer 6 (*)    All other components within normal limits  LIPASE, BLOOD - Abnormal; Notable for the following:    Lipase 72 (*)    All other components within normal limits  HEMOGLOBIN AND HEMATOCRIT, BLOOD - Abnormal; Notable for the following:    Hemoglobin 10.7 (*)    HCT 34.2 (*)    All other components within normal limits  HEMOGLOBIN AND HEMATOCRIT, BLOOD - Abnormal; Notable for the following:    Hemoglobin 10.4 (*)    HCT 32.6 (*)    All other components within normal limits  AMYLASE - Abnormal; Notable for the following:    Amylase 154 (*)    All other components within normal limits  BASIC METABOLIC PANEL - Abnormal; Notable for the following:    Glucose, Bld 65 (*)    BUN 29 (*)    Creatinine, Ser  11.66 (*)    GFR calc non Af Amer 3 (*)    GFR calc Af Amer 4 (*)    All other components within normal limits  CBC - Abnormal; Notable for the following:    WBC 2.4 (*)    RBC 3.07 (*)    Hemoglobin 10.2 (*)    HCT 32.3 (*)    MCV 105.2 (*)    Platelets 76 (*)    All other components within normal limits  HEMOGLOBIN AND HEMATOCRIT, BLOOD - Abnormal; Notable for the following:    Hemoglobin 11.3 (*)    HCT 35.8 (*)    All other components within normal limits  PHOSPHORUS - Abnormal; Notable for the following:    Phosphorus 5.8 (*)    All other components within normal limits  BASIC METABOLIC PANEL - Abnormal; Notable for the following:    Creatinine, Ser 8.59 (*)    GFR calc non Af Amer 5 (*)    GFR calc Af Amer 5 (*)    All other components within normal limits  CBC - Abnormal; Notable for the following:    WBC 2.5 (*)    RBC 3.19 (*)    Hemoglobin 10.6 (*)    HCT 33.7 (*)    MCV 105.6 (*)    Platelets 80 (*)    All other components within normal limits  TYPE AND SCREEN  DIFFERENTIAL  PROTIME-INR  PREPARE RBC (CROSSMATCH)  LIPASE, BLOOD  GLUCOSE, CAPILLARY   No results found.   1. GERD   2. Upper GI bleed   3. End stage renal disease on dialysis   4. HIV (human immunodeficiency virus infection)   5. Elevated liver enzymes   6. HUMAN IMMUNODEFICIENCY VIRUS [HIV]   7. ANEMIA, NORMOCYTIC, CHRONIC   8. ANXIETY   9. DEPRESSION   10. HYPERTENSION   11. End stage renal disease   12. MASS, RIGHT AXILLA   13. PANCREATITIS, HX OF   14. SHINGLES, HX OF   15. UGI bleed   16. Erosive esophagitis   17. Mallory - Weiss tear   18. Pancytopenia   19. ALCOHOL ABUSE   20. CHF   21. BRONCHITIS, ACUTE   22. PNEUMONIA   23. ABDOMINAL WALL HERNIA   24. PANCREATITIS   25. RENAL DISEASE   26. Pain in joint, upper arm   27. Headache   28. SYMPTOM, ENLARGEMENT, LYMPH NODES   29. Diarrhea   30. HYPOKALEMIA, HX OF   31. TOBACCO USE, QUIT   32. HYSTERECTOMY, TOTAL, HX OF         MDM   Patient / Family / Caregiver informed of clinical course, understand medical decision-making process, and agree with plan.       Babette Relic, MD 12/16/10 2219

## 2010-12-13 NOTE — ED Notes (Signed)
Pt has emergency contact, Penny B. that pt has given consent for medical information to be discussed. This RN present for conversation between pt and Kieth Brightly. Spoke with Kieth Brightly to inform her of pt's condition and that she will be admitted this evening. Phone number for Kieth Brightly is [phone].

## 2010-12-13 NOTE — H&P (Signed)
PCP:   No primary provider on file.   Chief Complaint:  hematemesis  HPI: This is a 60 y/o female with multiple medical conditions who today after dinner developed nausea and vomiting. Initially it was the food she had eaten, then she started vomiting bright red blood. This continued intermittently for a bit over an hour. 911 was called she was transported to the ER. She has not vomited since EMS came. She reports rib pain thought to be from retching. No significant abdominal, epigastric discomfort. She denies use of regular NSAID's, she does not eat spicy food because it causes heartburn. She has never had an UGI bleed before. She does not believe she has ever had an EGD. She does have HIV and chronic thrombocytopenia.   History obtained from patient who is a fair historian. In the ER guaic done showed trace positive stools.  Review of Systems: (positives bolded) The patient denies anorexia, fever, weight loss,, vision loss, decreased hearing, hoarseness, chest pain, syncope, dyspnea on exertion, peripheral edema, balance deficits, hemoptysis, abdominal pain, melena, hematochezia, severe indigestion/heartburn, hematuria, incontinence, genital sores, muscle weakness, suspicious skin lesions, transient blindness, difficulty walking, depression, unusual weight change, abnormal bleeding, enlarged lymph nodes, angioedema, and breast masses.  Past Medical History: Past Medical History  Diagnosis Date  . Pulmonary HTN   . Renal disorder   . Hypertension   . Depression   . Insomnia   . Chronic diarrhea   . HIV (human immunodeficiency virus infection)    Past Surgical History  Procedure Date  . Dialysis shunts     Medications: Prior to Admission medications   Medication Sig Start Date End Date Taking? Authorizing Provider  b complex-vitamin c-folic acid (NEPHRO-VITE) 0.8 MG TABS Take 0.8 mg by mouth at bedtime.     Yes Historical Provider, MD  cholestyramine Lucrezia Starch) 4 G packet Take by  mouth 2 (two) times daily with a meal. 1/2 scoop    Yes Historical Provider, MD  epoetin alfa (EPOGEN) 10000 UNIT/ML injection Inject 10,000 Units into the skin. To be given at dialysis    Yes Historical Provider, MD  fentaNYL (DURAGESIC - DOSED MCG/HR) 25 MCG/HR Place 1 patch onto the skin every other day.     Yes Historical Provider, MD  Flora-Q [facility]) CAPS Take 1 capsule by mouth daily.     Yes Historical Provider, MD  gabapentin (NEURONTIN) 300 MG capsule Take 300 mg by mouth every 12 (twelve) hours.     Yes Historical Provider, MD  hydroxypropyl methylcellulose (ISOPTO TEARS) 2.5 % ophthalmic solution Place 1 drop into both eyes 2 (two) times daily.     Yes Historical Provider, MD  lamivudine (EPIVIR) 100 MG tablet Take 0.5 tablets (50 mg total) by mouth daily. Take one half tablet daily 06/11/10  Yes Bobby Rumpf, MD  lanthanum (FOSRENOL) 500 MG chewable tablet 1,000 mg 3 (three) times daily with meals. Chew 1/2 tablet by mouth three times daily with snacks   Yes Historical Provider, MD  lidocaine (LIDODERM) 5 % Place 1 patch onto the skin daily. Remove & Discard patch within 12 hours or as directed by MD    Yes Historical Provider, MD  omeprazole (PRILOSEC) 40 MG capsule Take 40 mg by mouth daily.     Yes Historical Provider, MD  ondansetron (ZOFRAN) 4 MG tablet 2 mg every 4 (four) hours as needed. nausea   Yes Historical Provider, MD  oxycodone (OXY-IR) 5 MG capsule Take 5 mg by mouth 2 (two) times daily.  Yes Historical Provider, MD  paricalcitol (ZEMPLAR) 5 MCG/ML injection Inject into the vein.     Yes Historical Provider, MD  raltegravir (ISENTRESS) 400 MG tablet Take 400 mg by mouth 2 (two) times daily.     Yes Historical Provider, MD  sevelamer (RENVELA) 800 MG tablet Take 800 mg by mouth 3 (three) times daily with meals.     Yes Historical Provider, MD  sildenafil (REVATIO) 20 MG tablet Take 20 mg by mouth 3 (three) times daily.     Yes Historical Provider, MD  zidovudine  (RETROVIR) 100 MG capsule Take 100 mg by mouth 3 (three) times daily.     Yes Historical Provider, MD  acetaminophen (TYLENOL) 325 MG tablet Take 325 mg by mouth every 4 (four) hours as needed.      Historical Provider, MD  albuterol (VENTOLIN HFA) 108 (90 BASE) MCG/ACT inhaler Inhale 2 puffs into the lungs every 6 (six) hours as needed. Shortness of breath    Historical Provider, MD  ALPRAZolam (XANAX) 0.5 MG tablet Take 0.5 mg by mouth 2 (two) times daily as needed. anxiety    Historical Provider, MD  diltiazem (CARDIZEM) 30 MG tablet Take 30 mg by mouth 2 (two) times daily.      Historical Provider, MD  HYDROcodone-acetaminophen (VICODIN) 5-500 MG per tablet Take 1 tablet by mouth every 6 (six) hours as needed.      Historical Provider, MD  lanthanum (FOSRENOL) 500 MG chewable tablet Chew 500 mg by mouth 3 (three) times daily with meals.      Historical Provider, MD    Allergies:   Allergies  Allergen Reactions  . Penicillins     REACTION: rash    Social History:  reports that she has never smoked. She has never used smokeless tobacco. She reports that she does not drink alcohol or use illicit drugs. Lives at [facility] [place]. Uses a wheelchair. No home oxygen  Family History: History reviewed. No pertinent family history.  Physical Exam: Filed Vitals:   12/13/10 2049  BP: 137/75  Pulse: 81  Temp: 98.3 F (36.8 C)  TempSrc: Oral  Resp: 16  Height: 5\' 2"  (1.575 m)  Weight: 61.236 kg (135 lb)  SpO2: 94%    General:  Alert and oriented times three, well developed and nourished, no acute distress Eyes: PERRLA, pink conjunctiva, scleral icterus ENT: Moist oral mucosa, neck supple, no thyromegaly, no dentitian Lungs: clear to ascultation, no wheeze, no crackles, no use of accessory muscles Cardiovascular: regular rate and rhythm, no regurgitation, no gallops, no murmurs. No carotid bruits, no JVD Abdomen: soft, positive BS, non-tender, non-distended, no organomegaly, not  an acute abdomen GU: not examined Neuro: CN II - XII grossly intact, sensation intact Musculoskeletal: strength 5/5 all extremities, no clubbing, cyanosis or edema Skin: no rash, no subcutaneous crepitation, no decubitus Psych: appropriate patient   Labs on Admission:   [facility] 12/13/10 2148  NA 135  K 3.6  CL 95*  CO2 30  GLUCOSE 85  BUN 19  CREATININE 8.30*  CALCIUM 10.1  MG --  PHOS --    Basename 12/13/10 2148  AST 31  ALT 29  ALKPHOS 93  BILITOT 0.4  PROT 10.2*  ALBUMIN 3.1*    Basename 12/13/10 2148  LIPASE 72*  AMYLASE --    Basename 12/13/10 2148  WBC 3.8*  NEUTROABS 2.7  HGB 12.4  HCT 39.5  MCV 106.5*  PLT 87*   No results found for this basename: CKTOTAL:3,CKMB:3,CKMBINDEX:3,TROPONINI:3  in the last 72 hours No results found for this basename: TSH,T4TOTAL,FREET3,T3FREE,THYROIDAB in the last 72 hours No results found for this basename: VITAMINB12:2,FOLATE:2,FERRITIN:2,TIBC:2,IRON:2,RETICCTPCT:2 in the last 72 hours  Radiological Exams on Admission: Dg Neck Soft Tissue  11/20/2010  *RADIOLOGY REPORT*  Clinical Data: Throat pain.  NECK SOFT TISSUES - 1+ VIEW  Comparison: 03/08/2008  Findings: No radiopaque foreign body identified. Linear calcific density anterior to the C2-3 disc space is likely degenerative. No prevertebral soft tissue swelling or air.  Thyroid cartilage and epiglottis have a normal radiographic appearance. Mild multilevel degenerative changes of the cervical spine.  No acute osseous abnormality.  IMPRESSION: No radiopaque foreign body or prevertebral soft tissue swelling identified.  Original Report Authenticated By: Suanne Marker, M.D.   Dg Chest Portable 1 View  12/13/2010  *RADIOLOGY REPORT*  Clinical Data: , abdominal pain, shortness of breath, chest pain, weakness, chills, history hypertension, pulmonary hypertension, HIV  PORTABLE CHEST - 1 VIEW  Comparison: Portable exam 2135 hours compared to 01/26/2010  Findings: Mild  enlargement of cardiac silhouette. Atherosclerotic calcification aortic arch. Bilateral hilar enlargement, may represent pulmonary arterial hypertension but hilar adenopathy not excluded. Minimal bronchitic changes and right basilar atelectasis. Linear scarring left mid lung. No definite acute infiltrate or pleural effusion. Surgical clips right axilla. Diffuse osseous demineralization.  IMPRESSION: Enlargement of cardiac silhouette with pulmonary venous hypertension. Bilateral hilar enlargement, a component which may be from pulmonary arterial hypertension but hilar adenopathy not excluded. Bronchitic changes with right basilar atelectasis and left mid lung scarring.  Original Report Authenticated By: Burnetta Sabin, M.D.    Assessment/Plan Present on Admission:  .UGI bleed - admit step down -Gi consulted by Er physician Dr Emerson Monte, to see patient in AM -NPO, heplock -protonix IV BiD, zofran prn -serial h/h, transfusion orders and parameters .HUMAN IMMUNODEFICIENCY VIRUS [HIV] Stable, resume home meds .ANEMIA, NORMOCYTIC, CHRONIC .ANXIETY .DEPRESSION .HYPERTENSION .GERD Stable -resume home meds .SHINGLES, HX OF .End stage renal disease -dialysis at [facility], M/W/F. Consult in AM  Code status: full code SCD's for DVT prophylaxis Team 1 Jaella Weinert 12/13/2010, 11:44 PM

## 2010-12-13 NOTE — ED Notes (Signed)
Awaiting IVPB from Pharm.

## 2010-12-14 DIAGNOSIS — K920 Hematemesis: Secondary | ICD-10-CM

## 2010-12-14 LAB — TYPE AND SCREEN
ABO/RH(D): O POS
Antibody Screen: NEGATIVE

## 2010-12-14 LAB — HEMOGLOBIN AND HEMATOCRIT, BLOOD
HCT: 34.2 % — ABNORMAL LOW (ref 36.0–46.0)
HCT: 32.6 % — ABNORMAL LOW (ref 36.0–46.0)
Hemoglobin: 10.7 g/dL — ABNORMAL LOW (ref 12.0–15.0)

## 2010-12-14 LAB — PREPARE RBC (CROSSMATCH)

## 2010-12-14 MED ORDER — PNEUMOCOCCAL VAC POLYVALENT 25 MCG/0.5ML IJ INJ
0.5000 mL | INJECTION | INTRAMUSCULAR | Status: DC
  Filled 2010-12-14: qty 0.5

## 2010-12-14 MED ORDER — RALTEGRAVIR POTASSIUM 400 MG PO TABS
400.0000 mg | ORAL_TABLET | Freq: Two times a day (BID) | ORAL | Status: DC
  Administered 2010-12-14 – 2010-12-16 (×4): 400 mg via ORAL
  Filled 2010-12-14 (×8): qty 1

## 2010-12-14 MED ORDER — LANTHANUM CARBONATE 500 MG PO CHEW
1000.0000 mg | CHEWABLE_TABLET | Freq: Three times a day (TID) | ORAL | Status: DC
Start: 2010-12-14 — End: 2010-12-16
  Administered 2010-12-14 – 2010-12-16 (×3): 1000 mg via ORAL
  Filled 2010-12-14 (×10): qty 2

## 2010-12-14 MED ORDER — ACETAMINOPHEN 325 MG PO TABS: 650.0000 mg | ORAL_TABLET | Freq: Once | ORAL | Status: DC

## 2010-12-14 MED ORDER — OXYCODONE HCL 5 MG PO TABS
5.0000 mg | ORAL_TABLET | Freq: Two times a day (BID) | ORAL | Status: DC
  Administered 2010-12-14 – 2010-12-16 (×4): 5 mg via ORAL
  Filled 2010-12-14 (×5): qty 1

## 2010-12-14 MED ORDER — SILDENAFIL CITRATE 20 MG PO TABS
20.0000 mg | ORAL_TABLET | Freq: Three times a day (TID) | ORAL | Status: DC
  Administered 2010-12-14 – 2010-12-15 (×4): 20 mg via ORAL
  Filled 2010-12-14 (×10): qty 1

## 2010-12-14 MED ORDER — IPRATROPIUM BROMIDE 0.02 % IN SOLN: 0.5000 mg | Freq: Four times a day (QID) | RESPIRATORY_TRACT | Status: DC | PRN

## 2010-12-14 MED ORDER — POLYVINYL ALCOHOL 1.4 % OP SOLN
1.0000 [drp] | Freq: Two times a day (BID) | OPHTHALMIC | Status: DC
  Administered 2010-12-14 – 2010-12-15 (×4): 1 [drp] via OPHTHALMIC
  Filled 2010-12-14: qty 15

## 2010-12-14 MED ORDER — ZIDOVUDINE 100 MG PO CAPS
100.0000 mg | ORAL_CAPSULE | Freq: Three times a day (TID) | ORAL | Status: DC
  Administered 2010-12-14 – 2010-12-16 (×5): 100 mg via ORAL
  Filled 2010-12-14 (×10): qty 1

## 2010-12-14 MED ORDER — SEVELAMER CARBONATE 800 MG PO TABS
800.0000 mg | ORAL_TABLET | Freq: Three times a day (TID) | ORAL | Status: DC
  Administered 2010-12-14 – 2010-12-16 (×3): 800 mg via ORAL
  Filled 2010-12-14 (×3): qty 1

## 2010-12-14 MED ORDER — LAMIVUDINE 10 MG/ML PO SOLN
50.0000 mg | Freq: Every day | ORAL | Status: DC
  Administered 2010-12-16: 50 mg via ORAL
  Filled 2010-12-14 (×5): qty 5

## 2010-12-14 MED ORDER — PANTOPRAZOLE SODIUM 40 MG IV SOLR
40.0000 mg | Freq: Two times a day (BID) | INTRAVENOUS | Status: DC
  Administered 2010-12-14 (×2): 40 mg via INTRAVENOUS
  Filled 2010-12-14 (×2): qty 40

## 2010-12-14 MED ORDER — PNEUMOCOCCAL VAC POLYVALENT 25 MCG/0.5ML IJ INJ
0.5000 mL | INJECTION | INTRAMUSCULAR | Status: AC
  Filled 2010-12-14: qty 0.5

## 2010-12-14 MED ORDER — MORPHINE SULFATE 2 MG/ML IJ SOLN: 1.0000 mg | INTRAMUSCULAR | Status: DC | PRN

## 2010-12-14 MED ORDER — ALBUTEROL SULFATE (5 MG/ML) 0.5% IN NEBU: 2.5000 mg | INHALATION_SOLUTION | Freq: Four times a day (QID) | RESPIRATORY_TRACT | Status: DC | PRN

## 2010-12-14 MED ORDER — FENTANYL 25 MCG/HR TD PT72
25.0000 ug | MEDICATED_PATCH | TRANSDERMAL | Status: DC
  Administered 2010-12-14: 25 ug via TRANSDERMAL
  Filled 2010-12-14 (×2): qty 1

## 2010-12-14 MED ORDER — ALPRAZOLAM 0.5 MG PO TABS: 0.5000 mg | ORAL_TABLET | Freq: Two times a day (BID) | ORAL | Status: DC | PRN

## 2010-12-14 MED ORDER — SODIUM CHLORIDE 0.9 % IJ SOLN
3.0000 mL | INTRAMUSCULAR | Status: DC | PRN
  Administered 2010-12-14: 3 mL via INTRAVENOUS
  Filled 2010-12-14: qty 3

## 2010-12-14 MED ORDER — SODIUM CHLORIDE 0.9 % IV SOLN
100.0000 mL | INTRAVENOUS | Status: DC | PRN
  Administered 2010-12-15: 100 mL via INTRAVENOUS

## 2010-12-14 MED ORDER — LAMIVUDINE 100 MG PO TABS
50.0000 mg | ORAL_TABLET | Freq: Every day | ORAL | Status: DC
  Filled 2010-12-14 (×2): qty 1

## 2010-12-14 MED ORDER — GABAPENTIN 300 MG PO CAPS
300.0000 mg | ORAL_CAPSULE | Freq: Two times a day (BID) | ORAL | Status: DC
  Administered 2010-12-14 – 2010-12-16 (×4): 300 mg via ORAL
  Filled 2010-12-14 (×5): qty 1

## 2010-12-14 MED ORDER — DIPHENHYDRAMINE HCL 25 MG PO CAPS: 25.0000 mg | ORAL_CAPSULE | Freq: Once | ORAL | Status: DC

## 2010-12-14 MED ORDER — SODIUM CHLORIDE 0.9 % IJ SOLN
INTRAMUSCULAR | Status: AC
  Administered 2010-12-14: 10 mL
  Filled 2010-12-14: qty 10

## 2010-12-14 MED ORDER — ONDANSETRON HCL 4 MG/2ML IJ SOLN: 4.0000 mg | Freq: Four times a day (QID) | INTRAMUSCULAR | Status: DC | PRN

## 2010-12-14 NOTE — ED Notes (Signed)
Pt to be admitted to room 311.

## 2010-12-14 NOTE — ED Notes (Signed)
0245  Attempted to call report to unit.  Was informed RN unavailable at present and will call back.

## 2010-12-14 NOTE — Consult Note (Signed)
Reason for Consult: End-stage renal disease Referring Physician: hospitalist  [name] is an 60 y.o. female.  HPI: She is a patient was history of her HIV/AIDS end-stage renal disease on maintenance hemodialysis presently came with complaints of nausea vomiting. The vomitus contains blood and blood clots. This is associated with abdominal pain. Patient denies any fever chills or sweating. And also she didn't have any diarrhea. And this happens her yesterday after she had her supper. Presently patient seems to be feeling better she'll have any nausea no vomiting she denies also any orthopnea or paroxysmal nocturnal dyspnea.  Past Medical History  Diagnosis Date  . Pulmonary HTN   . Renal disorder   . Hypertension   . Depression   . Insomnia   . Chronic diarrhea   . HIV (human immunodeficiency virus infection)     Past Surgical History  Procedure Date  . Dialysis shunts     History reviewed. No pertinent family history.  Social History:  reports that she has never smoked. She has never used smokeless tobacco. She reports that she does not drink alcohol or use illicit drugs.  Allergies:  Allergies  Allergen Reactions  . Penicillins     REACTION: rash    Medications: I have reviewed the patient's current medications.  Results for orders placed during the hospital encounter of 12/13/10 (from the past 48 hour(s))  TYPE AND SCREEN     Status: Normal   Collection Time   12/13/10  9:47 PM      Component Value Range Comment   ABO/RH(D) O POS      Antibody Screen NEG      Sample Expiration 12/16/2010     CBC     Status: Abnormal   Collection Time   12/13/10  9:48 PM      Component Value Range Comment   WBC 3.8 (*) 4.0 - 10.5 (K/uL)    RBC 3.71 (*) 3.87 - 5.11 (MIL/uL)    Hemoglobin 12.4  12.0 - 15.0 (g/dL)    HCT 39.5  36.0 - 46.0 (%)    MCV 106.5 (*) 78.0 - 100.0 (fL)    MCH 33.4  26.0 - 34.0 (pg)    MCHC 31.4  30.0 - 36.0 (g/dL)    RDW 14.1  11.5 - 15.5 (%)    Platelets 87 (*) 150 - 400 (K/uL)   DIFFERENTIAL     Status: Normal   Collection Time   12/13/10  9:48 PM      Component Value Range Comment   Neutrophils Relative 70  43 - 77 (%)    Neutro Abs 2.7  1.7 - 7.7 (K/uL)    Lymphocytes Relative 20  12 - 46 (%)    Lymphs Abs 0.8  0.7 - 4.0 (K/uL)    Monocytes Relative 8  3 - 12 (%)    Monocytes Absolute 0.3  0.1 - 1.0 (K/uL)    Eosinophils Relative 2  0 - 5 (%)    Eosinophils Absolute 0.1  0.0 - 0.7 (K/uL)    Basophils Relative 0  0 - 1 (%)    Basophils Absolute 0.0  0.0 - 0.1 (K/uL)   COMPREHENSIVE METABOLIC PANEL     Status: Abnormal   Collection Time   12/13/10  9:48 PM      Component Value Range Comment   Sodium 135  135 - 145 (mEq/L)    Potassium 3.6  3.5 - 5.1 (mEq/L)    Chloride 95 (*) 96 -  112 (mEq/L)    CO2 30  19 - 32 (mEq/L)    Glucose, Bld 85  70 - 99 (mg/dL)    BUN 19  6 - 23 (mg/dL)    Creatinine, Ser 8.30 (*) 0.50 - 1.10 (mg/dL)    Calcium 10.1  8.4 - 10.5 (mg/dL)    Total Protein 10.2 (*) 6.0 - 8.3 (g/dL)    Albumin 3.1 (*) 3.5 - 5.2 (g/dL)    AST 31  0 - 37 (U/L)    ALT 29  0 - 35 (U/L)    Alkaline Phosphatase 93  39 - 117 (U/L)    Total Bilirubin 0.4  0.3 - 1.2 (mg/dL)    GFR calc non Af Amer 5 (*) >90 (mL/min)    GFR calc Af Amer 6 (*) >90 (mL/min)   PROTIME-INR     Status: Normal   Collection Time   12/13/10  9:48 PM      Component Value Range Comment   Prothrombin Time 14.4  11.6 - 15.2 (seconds)    INR 1.10  0.00 - 1.49    LIPASE, BLOOD     Status: Abnormal   Collection Time   12/13/10  9:48 PM      Component Value Range Comment   Lipase 72 (*) 11 - 59 (U/L)   HEMOGLOBIN AND HEMATOCRIT, BLOOD     Status: Abnormal   Collection Time   12/14/10  8:28 AM      Component Value Range Comment   Hemoglobin 10.7 (*) 12.0 - 15.0 (g/dL)    HCT 34.2 (*) 36.0 - 46.0 (%)   PREPARE RBC (CROSSMATCH)     Status: Normal   Collection Time   12/14/10  8:30 AM      Component Value Range Comment   Order Confirmation  OK TO TRANSFUSE       Dg Chest Portable 1 View  12/13/2010  *RADIOLOGY REPORT*  Clinical Data: , abdominal pain, shortness of breath, chest pain, weakness, chills, history hypertension, pulmonary hypertension, HIV  PORTABLE CHEST - 1 VIEW  Comparison: Portable exam 2135 hours compared to 01/26/2010  Findings: Mild enlargement of cardiac silhouette. Atherosclerotic calcification aortic arch. Bilateral hilar enlargement, may represent pulmonary arterial hypertension but hilar adenopathy not excluded. Minimal bronchitic changes and right basilar atelectasis. Linear scarring left mid lung. No definite acute infiltrate or pleural effusion. Surgical clips right axilla. Diffuse osseous demineralization.  IMPRESSION: Enlargement of cardiac silhouette with pulmonary venous hypertension. Bilateral hilar enlargement, a component which may be from pulmonary arterial hypertension but hilar adenopathy not excluded. Bronchitic changes with right basilar atelectasis and left mid lung scarring.  Original Report Authenticated By: Burnetta Sabin, M.D.    Review of Systems  Gastrointestinal: Positive for nausea, vomiting and abdominal pain.       Patient was brought from the nursing home after she has  an episode of vomiting which containsblood. According to the patient she was feeling good she didn't have any problem however until started having some pain in  her abdomen followed by vomitng. She said her vomitus contains blood and also blood clot. Presently she is feeling. Better. She denies any diarrhea no shortness of breath orthopnea.   Blood pressure 100/60, pulse 70, temperature 98.3 F (36.8 C), temperature source Oral, resp. rate 18, height 5\' 2"  (1.575 m), weight 61.1 kg (134 lb 11.2 oz), SpO2 100.00%. Physical Exam  Eyes: No scleral icterus.  Neck: No JVD present.  Respiratory: No respiratory distress. She has no  wheezes. She has no rales. She exhibits no tenderness.  GI: She exhibits no distension. There is  no tenderness. There is no rebound.  Musculoskeletal: She exhibits no edema.    Assessment/Plan: Problem #1 end-stage renal disease she status post hemodialysis on Friday BUN and creatinine is was in acceptable range normal potassium. Patient doesn't have any uremic sinus symptoms.  Problem #2 history of her hematemesis patient is being seen by GI she is going to have an upper endoscopy tomorrow. Problem #3 history of HIV/AIDS presently she is on antiviral medication patient seems to be doing reasonably good. Problem #4 history of pulmonary hypertension Problem #5 history of her hypertension her blood pressure seems to be controlled reasonably good. Problem #6 history of pancreatitis patient is a slightly elevated lipase. Problem #7 history of depression Problem #8 history of mild but chronic diarrhea Problem #9 history of her hypoalbuminemia with poor nutrition recently however patient seems to be doing better. Recommendation we'll make arrangements for patient to get dialysis tomorrow We'll hold heparin during dialysis We'll check  basic metabolic panel CBC and also a phosphorus in the morning. He  Lavaughn Bisig S 12/14/2010, 2:07 PM

## 2010-12-14 NOTE — Progress Notes (Signed)
Chart reviewed. Per nursing, no emesis or bleeding  Subjective: No nausea. No pain. No emesis.  Objective: Vital signs in last 24 hours: Filed Vitals:   12/13/10 2049 12/14/10 0238 12/14/10 0409 12/14/10 0606  BP: 137/75 98/52 96/60  100/60  Pulse: 81 72 74 70  Temp: 98.3 F (36.8 C) 98.5 F (36.9 C) 98 F (36.7 C) 98.3 F (36.8 C)  TempSrc: Oral Oral Oral Oral  Resp: 16 18 18 18   Height: 5\' 2"  (1.575 m)  5\' 2"  (1.575 m)   Weight: 61.236 kg (135 lb)  61.1 kg (134 lb 11.2 oz)   SpO2: 94% 95% 96% 100%   Weight change:  No intake or output data in the 24 hours ending 12/14/10 1052  General: Sleepy. Intermittently answers questions but not reliably. Lungs clear to auscultation bilaterally without wheezes rhonchi or rales. Cardiovascular regular rate rhythm without murmurs gallops rubs Abdomen normal bowel sounds soft nontender nondistended Extremities no clubbing cyanosis or edema  Lab Results: Basic Metabolic Panel:  Lab 123XX123 2148  NA 135  K 3.6  CL 95*  CO2 30  GLUCOSE 85  BUN 19  CREATININE 8.30*  CALCIUM 10.1  MG --  PHOS --   Liver Function Tests:  Lab 12/13/10 2148  AST 31  ALT 29  ALKPHOS 93  BILITOT 0.4  PROT 10.2*  ALBUMIN 3.1*    Lab 12/13/10 2148  LIPASE 72*  AMYLASE --   No results found for this basename: AMMONIA:2 in the last 168 hours CBC:  Lab 12/14/10 0828 12/13/10 2148  WBC -- 3.8*  NEUTROABS -- 2.7  HGB 10.7* 12.4  HCT 34.2* 39.5  MCV -- 106.5*  PLT -- 87*   Cardiac Enzymes: No results found for this basename: CKTOTAL:3,CKMB:3,CKMBINDEX:3,TROPONINI:3 in the last 168 hours BNP: No results found for this basename: POCBNP:3 in the last 168 hours D-Dimer: No results found for this basename: DDIMER:2 in the last 168 hours CBG: No results found for this basename: GLUCAP:6 in the last 168 hours Hemoglobin A1C: No results found for this basename: HGBA1C in the last 168 hours Fasting Lipid Panel: No results found for this  basename: CHOL,HDL,LDLCALC,TRIG,CHOLHDL,LDLDIRECT in the last 168 hours Thyroid Function Tests: No results found for this basename: TSH,T4TOTAL,FREET4,T3FREE,THYROIDAB in the last 168 hours Anemia Panel: No results found for this basename: VITAMINB12,FOLATE,FERRITIN,TIBC,IRON,RETICCTPCT in the last 168 hours  Micro Results: No results found for this or any previous visit (from the past 240 hour(s)). Studies/Results: Dg Chest Portable 1 View  12/13/2010  *RADIOLOGY REPORT*  Clinical Data: , abdominal pain, shortness of breath, chest pain, weakness, chills, history hypertension, pulmonary hypertension, HIV  PORTABLE CHEST - 1 VIEW  Comparison: Portable exam 2135 hours compared to 01/26/2010  Findings: Mild enlargement of cardiac silhouette. Atherosclerotic calcification aortic arch. Bilateral hilar enlargement, may represent pulmonary arterial hypertension but hilar adenopathy not excluded. Minimal bronchitic changes and right basilar atelectasis. Linear scarring left mid lung. No definite acute infiltrate or pleural effusion. Surgical clips right axilla. Diffuse osseous demineralization.  IMPRESSION: Enlargement of cardiac silhouette with pulmonary venous hypertension. Bilateral hilar enlargement, a component which may be from pulmonary arterial hypertension but hilar adenopathy not excluded. Bronchitic changes with right basilar atelectasis and left mid lung scarring.  Original Report Authenticated By: Burnetta Sabin, M.D.   Scheduled Meds:   . sodium chloride  1,000 mL Intravenous STAT  . acetaminophen  650 mg Oral Once  . diphenhydrAMINE  25 mg Oral Once  . fentaNYL  25 mcg Transdermal  Q48H  . fentaNYL  100 mcg Intravenous Once  . gabapentin  300 mg Oral Q12H  . lamivudine  50 mg Oral Daily  . lanthanum  1,000 mg Oral TID WC  . ondansetron (ZOFRAN) IV  4 mg Intravenous Once  . oxyCODONE  5 mg Oral BID  . pantoprazole (PROTONIX) IV  80 mg Intravenous Once  . pantoprazole (PROTONIX) IV  40  mg Intravenous Q12H  . pneumococcal 23 valent vaccine  0.5 mL Intramuscular Tomorrow-1000  . polyvinyl alcohol  1 drop Both Eyes BID  . raltegravir  400 mg Oral BID  . sevelamer  800 mg Oral TID WC  . sildenafil  20 mg Oral TID  . sodium chloride  500 mL Intravenous Once  . zidovudine  100 mg Oral TID  . DISCONTD: pneumococcal 23 valent vaccine  0.5 mL Intramuscular Tomorrow-1000   Continuous Infusions:  PRN Meds:.albuterol, ALPRAZolam, ipratropium, morphine, ondansetron (ZOFRAN) IV, sodium chloride Assessment/Plan: Principal Problem:  *UGI bleed Active Problems:  HUMAN IMMUNODEFICIENCY VIRUS [HIV]  ANEMIA, NORMOCYTIC, CHRONIC  ANXIETY  DEPRESSION  HYPERTENSION  GERD  End stage renal disease  PANCREATITIS, HX OF Pulmonary hypertension  The patient has had no further hematemesis. Her hemoglobin dropped slightly. GI consult is pending. She has a history of pancreatitis and her lipase is slightly elevated. She won't give much history today. Continue to monitor her lipase and amylase. N.p.o. Consult nephrology for dialysis tomorrow. Discontinue telemetry. Blood pressure and heart rate are stable.   LOS: 1 day   Angeni Chaudhuri L 12/14/2010, 10:52 AM

## 2010-12-14 NOTE — ED Notes (Signed)
Pt denies pain at present time.  Reporting no nausea. IV infusing with no difficulty, IV site benign.

## 2010-12-14 NOTE — Progress Notes (Signed)
Pt up to chair with 1 assist and use of walker today.  Pt tolerated well with no complaints.  Pt states she uses a wheelchair at [facility] for mobility.

## 2010-12-14 NOTE — ED Notes (Signed)
Pt resting quietly, denies pain or nausea at present time.

## 2010-12-14 NOTE — Consults (Signed)
Referring Provider: Primary Care Physician:  Casilda Carls Primary Gastroenterologist:  Dr.   Luiz Iron for Consultation:   Hematemesis  HPI:      A 60 year old African American lady nursing home resident brought into the emergency department last evening after experiencing a hematemesis just after eating dinner. She states she felt a "full" sensation in her upper abdomen that started vomiting. She's had no associated abdominal pain. She's had no melena or rectal bleeding. She has remained hemodynamically stable. She was seen in the ED by Dr. Stevie Kern he told me last evening he found her to be trace Hemoccult positive on digital rectal examination.  Initial H&H 12.4 and 39.5 overnight; H&H dropped to 10.7 and 34.2 this morning; it is notable that in reviewing the hospital record her H&H was 11.9 and 36.5 back on 01/26/2010.  Patient hasn't had any further hematemesis overnight and, again, no abdominal pain .  Of note, her lipase was minimally elevated at 72. She does have chronic kidney disease and is on hemodialysis. This lady relates chronic GERD symptoms and takes omeprazole 40 mg orally daily. She has almost daily breakthrough symptoms as she describes. No dysphagia no abdominal dysphagia. She denies recent weight loss. No prior history of peptic ulcer disease, GI bleeding or other GI diseases. No prior EGD. Patient has never had screening colonoscopy and does not want one because she is afraid of the procedure.    Because of her multiple comorbidities, including HIV, she is on multiple medications (see below).     Past Medical History  Diagnosis Date  . Pulmonary HTN   . Renal disorder   . Hypertension   . Depression   . Insomnia   . Chronic diarrhea   . HIV (human immunodeficiency virus infection)     Past Surgical History  Procedure Date  . Dialysis shunts     Prior to Admission medications   Medication Sig Start Date End Date Taking? Authorizing Provider  b complex-vitamin c-folic acid  (NEPHRO-VITE) 0.8 MG TABS Take 0.8 mg by mouth at bedtime.     Yes Historical Provider, MD  cholestyramine Lucrezia Starch) 4 G packet Take by mouth 2 (two) times daily with a meal. 1/2 scoop    Yes Historical Provider, MD  epoetin alfa (EPOGEN) 10000 UNIT/ML injection Inject 10,000 Units into the skin. To be given at dialysis    Yes Historical Provider, MD  fentaNYL (DURAGESIC - DOSED MCG/HR) 25 MCG/HR Place 1 patch onto the skin every other day.     Yes Historical Provider, MD  Flora-Q [facility]) CAPS Take 1 capsule by mouth daily.     Yes Historical Provider, MD  gabapentin (NEURONTIN) 300 MG capsule Take 300 mg by mouth every 12 (twelve) hours.     Yes Historical Provider, MD  hydroxypropyl methylcellulose (ISOPTO TEARS) 2.5 % ophthalmic solution Place 1 drop into both eyes 2 (two) times daily.     Yes Historical Provider, MD  lamivudine (EPIVIR) 100 MG tablet Take 0.5 tablets (50 mg total) by mouth daily. Take one half tablet daily 06/11/10  Yes Bobby Rumpf, MD  lanthanum (FOSRENOL) 500 MG chewable tablet 1,000 mg 3 (three) times daily with meals. Chew 1/2 tablet by mouth three times daily with snacks   Yes Historical Provider, MD  lidocaine (LIDODERM) 5 % Place 1 patch onto the skin daily. Remove & Discard patch within 12 hours or as directed by MD    Yes Historical Provider, MD  omeprazole (PRILOSEC) 40 MG capsule Take 40 mg by  mouth daily.     Yes Historical Provider, MD  ondansetron (ZOFRAN) 4 MG tablet 2 mg every 4 (four) hours as needed. nausea   Yes Historical Provider, MD  oxycodone (OXY-IR) 5 MG capsule Take 5 mg by mouth 2 (two) times daily.     Yes Historical Provider, MD  paricalcitol (ZEMPLAR) 5 MCG/ML injection Inject into the vein.     Yes Historical Provider, MD  raltegravir (ISENTRESS) 400 MG tablet Take 400 mg by mouth 2 (two) times daily.     Yes Historical Provider, MD  sevelamer (RENVELA) 800 MG tablet Take 800 mg by mouth 3 (three) times daily with meals.     Yes Historical  Provider, MD  sildenafil (REVATIO) 20 MG tablet Take 20 mg by mouth 3 (three) times daily.     Yes Historical Provider, MD  zidovudine (RETROVIR) 100 MG capsule Take 100 mg by mouth 3 (three) times daily.     Yes Historical Provider, MD  acetaminophen (TYLENOL) 325 MG tablet Take 325 mg by mouth every 4 (four) hours as needed.      Historical Provider, MD  albuterol (VENTOLIN HFA) 108 (90 BASE) MCG/ACT inhaler Inhale 2 puffs into the lungs every 6 (six) hours as needed. Shortness of breath    Historical Provider, MD  ALPRAZolam (XANAX) 0.5 MG tablet Take 0.5 mg by mouth 2 (two) times daily as needed. anxiety    Historical Provider, MD  diltiazem (CARDIZEM) 30 MG tablet Take 30 mg by mouth 2 (two) times daily.      Historical Provider, MD  HYDROcodone-acetaminophen (VICODIN) 5-500 MG per tablet Take 1 tablet by mouth every 6 (six) hours as needed.      Historical Provider, MD  lanthanum (FOSRENOL) 500 MG chewable tablet Chew 500 mg by mouth 3 (three) times daily with meals.      Historical Provider, MD    Current Facility-Administered Medications  Medication Dose Route Frequency Provider Last Rate Last Dose  . 0.9 %  sodium chloride infusion  1,000 mL Intravenous STAT Babette Relic, MD   1,000 mL at 12/13/10 2145  . acetaminophen (TYLENOL) tablet 650 mg  650 mg Oral Once Debby Crosley, MD      . albuterol (PROVENTIL) (5 MG/ML) 0.5% nebulizer solution 2.5 mg  2.5 mg Nebulization Q6H PRN Debby Crosley, MD      . ALPRAZolam Duanne Moron) tablet 0.5 mg  0.5 mg Oral BID PRN Debby Crosley, MD      . diphenhydrAMINE (BENADRYL) capsule 25 mg  25 mg Oral Once Debby Crosley, MD      . fentaNYL ([place] - dosed mcg/hr) patch 25 mcg  25 mcg Transdermal Q48H Debby Crosley, MD   25 mcg at 12/14/10 1101  . fentaNYL (SUBLIMAZE) 0.05 MG/ML injection 100 mcg  100 mcg Intravenous Once Babette Relic, MD   100 mcg at 12/13/10 2242  . gabapentin (NEURONTIN) capsule 300 mg  300 mg Oral Q12H Debby Crosley, MD   300 mg at  12/14/10 1100  . ipratropium (ATROVENT) nebulizer solution 0.5 mg  0.5 mg Nebulization Q6H PRN Debby Crosley, MD      . lamivudine (EPIVIR) tablet 50 mg  50 mg Oral Daily Debby Crosley, MD      . lanthanum (FOSRENOL) chewable tablet 1,000 mg  1,000 mg Oral TID WC Debby Crosley, MD      . morphine 2 MG/ML injection 1 mg  1 mg Intravenous Q4H PRN Quintella Baton, MD      .  ondansetron (ZOFRAN) injection 4 mg  4 mg Intravenous Once Babette Relic, MD   4 mg at 12/13/10 2200  . ondansetron (ZOFRAN) injection 4 mg  4 mg Intravenous Q6H PRN Debby Crosley, MD      . oxyCODONE (Oxy IR/ROXICODONE) immediate release tablet 5 mg  5 mg Oral BID Debby Crosley, MD   5 mg at 12/14/10 1100  . pantoprazole (PROTONIX) 80 mg in sodium chloride 0.9 % 100 mL IVPB  80 mg Intravenous Once Babette Relic, MD   80 mg at 12/13/10 2254  . pantoprazole (PROTONIX) injection 40 mg  40 mg Intravenous Q12H Debby Crosley, MD   40 mg at 12/14/10 1100  . pneumococcal 23 valent vaccine (PNU-IMMUNE) injection 0.5 mL  0.5 mL Intramuscular Tomorrow-1000 Corinna L Sullivan      . polyvinyl alcohol (LIQUIFILM TEARS) 1.4 % ophthalmic solution 1 drop  1 drop Both Eyes BID Debby Crosley, MD   1 drop at 12/14/10 1100  . raltegravir (ISENTRESS) tablet 400 mg  400 mg Oral BID Debby Crosley, MD      . sevelamer (RENVELA) tablet 800 mg  800 mg Oral TID WC Debby Crosley, MD      . sildenafil (REVATIO) tablet 20 mg  20 mg Oral TID Debby Crosley, MD      . sodium chloride 0.9 % bolus 500 mL  500 mL Intravenous Once Babette Relic, MD   500 mL at 12/13/10 2145  . sodium chloride 0.9 % injection 3 mL  3 mL Intravenous PRN Debby Crosley, MD   3 mL at 12/14/10 1100  . zidovudine (RETROVIR) capsule 100 mg  100 mg Oral TID Quintella Baton, MD      . DISCONTD: pneumococcal 23 valent vaccine (PNU-IMMUNE) injection 0.5 mL  0.5 mL Intramuscular Tomorrow-1000 Debby Crosley, MD        Allergies as of 12/13/2010 - Review Complete 12/13/2010  Allergen Reaction Noted   . Penicillins      History reviewed. No pertinent family history.  History   Social History  . Marital Status: Widowed    Spouse Name: N/A    Number of Children: N/A  . Years of Education: N/A   Occupational History  . Not on file.   Social History Main Topics  . Smoking status: Never Smoker   . Smokeless tobacco: Never Used  . Alcohol Use: No  . Drug Use: No  . Sexually Active: Not Currently   Other Topics Concern  . Not on file   Social History Narrative  . No narrative on file   Social history: Patient is widowed. She has one daughter and 2 grandchildren. She resides in a nursing home in [facility]. No tobacco alcohol or illicit drug use.   Review of Systems: Gen: Denies any fever, chills, sweats, anorexia, fatigue, weakness, malaise, weight loss, and sleep disorder CV: Denies chest pain, angina, palpitations, syncope, orthopnea, PND, peripheral edema, and claudication. Resp: Denies dyspnea at rest, dyspnea with exercise, cough, sputum, wheezing, coughing up blood, and pleurisy. GI: Denies vomiting blood, jaundice, and fecal incontinence.   Denies dysphagia or odynophagia. Derm: Denies rash, itching, dry skin, hives, moles, warts, or unhealing ulcers.    Physical Exam: Vital signs in last 24 hours: Temp:  [98 F (36.7 C)-98.5 F (36.9 C)] 98.3 F (36.8 C) (10/14 0606) Pulse Rate:  [70-81] 70  (10/14 0606) Resp:  [16-18] 18  (10/14 0606) BP: (96-137)/(52-75) 100/60 mmHg (10/14 0606) SpO2:  [94 %-100 %]  100 % (10/14 0606) Weight:  [134 lb 11.2 oz (61.1 kg)-135 lb (61.236 kg)] 134 lb 11.2 oz (61.1 kg) (10/14 0409) Last BM Date: 12/14/10 General:   Chronically ill lady somewhat slow to speak. But otherwise pleasant responsive oriented Alert,  Well-developed, well-nourished, pleasant and cooperative in NAD Head:  Normocephalic and atraumatic. Eyes:  Sclera clear, no icterus.   Conjunctiva pink. Nose:  No deformity, discharge,  or lesions. Mouth:  No  deformity or lesions, dentition normal. Neck:  Supple; no masses or thyromegaly. Lungs:  Clear throughout to auscultation.   No wheezes, crackles, or rhonchi. No acute distress. Heart:  Regular rate and rhythm; no murmurs, clicks, rubs,  or gallops. Abdomen:  Positive bowel sounds;soft, nontender without appreciable mass or organomegaly Rectal:  Already done in the ED  Pulses:  Normal pulses noted.    Lab Results:  [facility] 12/14/10 0828 12/13/10 2148  WBC -- 3.8*  HGB 10.7* 12.4  HCT 34.2* 39.5  PLT -- 87*   BMET  Basename 12/13/10 2148  NA 135  K 3.6  CL 95*  CO2 30  GLUCOSE 85  BUN 19  CREATININE 8.30*  CALCIUM 10.1   LFT  Basename 12/13/10 2148  PROT 10.2*  ALBUMIN 3.1*  AST 31  ALT 29  ALKPHOS 93  BILITOT 0.4  BILIDIR --  IBILI --   PT/INR  Basename 12/13/10 2148  LABPROT 14.4  INR 1.10    Studies/Results: Dg Chest Portable 1 View  12/13/2010  *RADIOLOGY REPORT*  Clinical Data: , abdominal pain, shortness of breath, chest pain, weakness, chills, history hypertension, pulmonary hypertension, HIV  PORTABLE CHEST - 1 VIEW  Comparison: Portable exam 2135 hours compared to 01/26/2010  Findings: Mild enlargement of cardiac silhouette. Atherosclerotic calcification aortic arch. Bilateral hilar enlargement, may represent pulmonary arterial hypertension but hilar adenopathy not excluded. Minimal bronchitic changes and right basilar atelectasis. Linear scarring left mid lung. No definite acute infiltrate or pleural effusion. Surgical clips right axilla. Diffuse osseous demineralization.  IMPRESSION: Enlargement of cardiac silhouette with pulmonary venous hypertension. Bilateral hilar enlargement, a component which may be from pulmonary arterial hypertension but hilar adenopathy not excluded. Bronchitic changes with right basilar atelectasis and left mid lung scarring.  Original Report Authenticated By: Burnetta Sabin, M.D.    Impression: A 60 year old lady with HIV  and chronic kidney disease on hemodialysis admitted with what appears to be self limiting hematemesis. This is in a background of chronic GERD somewhat refractory to PPI therapy. She is remained hemodynamically stable and has a notable 2 g drop in her hemoglobin since admission. Chronic thrombocytopenia and macrocytosis may be in part related to her anti-retroviral drug regimen. I doubt we are dealing with occult chronic liver disease. No prior EGD. No prior colonoscopy (patient has declined procedure previously per her report).  Mildly elevated serum lipase is nonspecific in the setting of nausea and vomiting and chronic kidney disease. I would not make a diagnosis of acute pancreatitis with the information we have at this time.  Plan: Agree with acid suppression therapy. I have offered  this nice lady a diagnostic EGD on December 15, 2010. LOS: 1 day  The risks, benefits, limitations, alternatives and imponderables have been reviewed with the patient. Potential for esophageal dilation, biopsy etc. Have also been reviewed.  Questions have been answered. All parties agreeable.   I like to thank Dr. Conley Canal and associates for allowing me to see this lady while she is here.     Manus Rudd  12/14/2010, 1:00 PM

## 2010-12-15 ENCOUNTER — Encounter (HOSPITAL_COMMUNITY)
Admission: EM | Disposition: A | Discharge status: Skilled Nursing Facility | Payer: Self-pay | Source: Home / Self Care | Attending: Internal Medicine

## 2010-12-15 ENCOUNTER — Inpatient Hospital Stay (HOSPITAL_COMMUNITY): Payer: Medicare Other

## 2010-12-15 ENCOUNTER — Other Ambulatory Visit: Payer: Self-pay | Admitting: Internal Medicine

## 2010-12-15 ENCOUNTER — Encounter (HOSPITAL_COMMUNITY): Payer: Self-pay

## 2010-12-15 DIAGNOSIS — K2100 Gastro-esophageal reflux disease with esophagitis, without bleeding: Secondary | ICD-10-CM

## 2010-12-15 DIAGNOSIS — K226 Gastro-esophageal laceration-hemorrhage syndrome: Secondary | ICD-10-CM

## 2010-12-15 DIAGNOSIS — D61818 Other pancytopenia: Secondary | ICD-10-CM | POA: Diagnosis not present

## 2010-12-15 DIAGNOSIS — R933 Abnormal findings on diagnostic imaging of other parts of digestive tract: Secondary | ICD-10-CM

## 2010-12-15 DIAGNOSIS — K920 Hematemesis: Secondary | ICD-10-CM

## 2010-12-15 DIAGNOSIS — K2210 Ulcer of esophagus without bleeding: Secondary | ICD-10-CM | POA: Diagnosis present

## 2010-12-15 HISTORY — PX: ESOPHAGOGASTRODUODENOSCOPY: SHX5428

## 2010-12-15 LAB — BASIC METABOLIC PANEL
BUN: 29 mg/dL — ABNORMAL HIGH (ref 6–23)
Chloride: 97 mEq/L (ref 96–112)
Creatinine, Ser: 11.66 mg/dL — ABNORMAL HIGH (ref 0.50–1.10)
Glucose, Bld: 65 mg/dL — ABNORMAL LOW (ref 70–99)
Potassium: 4.3 mEq/L (ref 3.5–5.1)

## 2010-12-15 LAB — CBC
HCT: 32.3 % — ABNORMAL LOW (ref 36.0–46.0)
Hemoglobin: 10.2 g/dL — ABNORMAL LOW (ref 12.0–15.0)
MCH: 33.2 pg (ref 26.0–34.0)
MCHC: 31.6 g/dL (ref 30.0–36.0)
RDW: 14 % (ref 11.5–15.5)

## 2010-12-15 LAB — GLUCOSE, CAPILLARY: Glucose-Capillary: 73 mg/dL (ref 70–99)

## 2010-12-15 SURGERY — EGD (ESOPHAGOGASTRODUODENOSCOPY)
Anesthesia: Moderate Sedation

## 2010-12-15 MED ORDER — MEPERIDINE HCL 100 MG/ML IJ SOLN
INTRAMUSCULAR | Status: AC
  Filled 2010-12-15: qty 1

## 2010-12-15 MED ORDER — STERILE WATER FOR IRRIGATION IR SOLN
Status: DC | PRN
  Administered 2010-12-15: 10:00:00

## 2010-12-15 MED ORDER — BUTAMBEN-TETRACAINE-BENZOCAINE 2-2-14 % EX AERO
INHALATION_SPRAY | CUTANEOUS | Status: DC | PRN
  Administered 2010-12-15: 1 via TOPICAL

## 2010-12-15 MED ORDER — MIDAZOLAM HCL 5 MG/5ML IJ SOLN
INTRAMUSCULAR | Status: AC
  Filled 2010-12-15: qty 10

## 2010-12-15 MED ORDER — PANTOPRAZOLE SODIUM 40 MG PO TBEC
40.0000 mg | DELAYED_RELEASE_TABLET | Freq: Two times a day (BID) | ORAL | Status: DC
  Administered 2010-12-15 – 2010-12-16 (×2): 40 mg via ORAL
  Filled 2010-12-15 (×2): qty 1

## 2010-12-15 MED ORDER — MEPERIDINE HCL 100 MG/ML IJ SOLN
INTRAMUSCULAR | Status: DC | PRN
  Administered 2010-12-15: 50 mg

## 2010-12-15 MED ORDER — MIDAZOLAM HCL 5 MG/5ML IJ SOLN
INTRAMUSCULAR | Status: DC | PRN
  Administered 2010-12-15: 2 mg via INTRAVENOUS

## 2010-12-15 NOTE — Plan of Care (Signed)
Outpatient plan of care requested from [facility] / [facility]

## 2010-12-15 NOTE — Progress Notes (Signed)
Subjective: No nausea. No pain. No emesis. No appetite.  Objective: Vital signs in last 24 hours: Filed Vitals:   12/15/10 1405 12/15/10 1430 12/15/10 1435 12/15/10 1500  BP: 90/48 82/45 86/45  99/47  Pulse: 65 64 65 61  Temp:    98.3 F (36.8 C)  TempSrc:    Oral  Resp: 16 16 16 16   Height:      Weight:    61.8 kg (136 lb 3.9 oz)  SpO2:    97%   Weight change: 0.964 kg (2 lb 2 oz)  Intake/Output Summary (Last 24 hours) at 12/15/10 1610 Last data filed at 12/15/10 1500  Gross per 24 hour  Intake    480 ml  Output   1400 ml  Net   -920 ml    General: Alert. Oriented. Lungs clear to auscultation bilaterally without wheezes rhonchi or rales. Cardiovascular regular rate rhythm without murmurs gallops rubs Abdomen normal bowel sounds soft nontender nondistended Extremities no clubbing cyanosis or edema  Lab Results: Basic Metabolic Panel:  Lab [id number] 0500 12/13/10 2148  NA 135 135  K 4.3 3.6  CL 97 95*  CO2 27 30  GLUCOSE 65* 85  BUN 29* 19  CREATININE 11.66* 8.30*  CALCIUM 9.4 10.1  MG -- --  PHOS 5.8* --   Liver Function Tests:  Lab 12/13/10 2148  AST 31  ALT 29  ALKPHOS 93  BILITOT 0.4  PROT 10.2*  ALBUMIN 3.1*    Lab 12/15/10 0500 12/13/10 2148  LIPASE 39 72*  AMYLASE 154* --   No results found for this basename: AMMONIA:2 in the last 168 hours CBC:  Lab 12/15/10 0500 12/14/10 2001 12/13/10 2148  WBC 2.4* -- 3.8*  NEUTROABS -- -- 2.7  HGB 10.2* 10.4* --  HCT 32.3* 32.6* --  MCV 105.2* -- 106.5*  PLT 76* -- 87*   Cardiac Enzymes: No results found for this basename: CKTOTAL:3,CKMB:3,CKMBINDEX:3,TROPONINI:3 in the last 168 hours BNP: No results found for this basename: POCBNP:3 in the last 168 hours D-Dimer: No results found for this basename: DDIMER:2 in the last 168 hours CBG:  Lab 12/15/10 0907  GLUCAP 73   Hemoglobin A1C: No results found for this basename: HGBA1C in the last 168 hours Fasting Lipid Panel: No results found  for this basename: CHOL,HDL,LDLCALC,TRIG,CHOLHDL,LDLDIRECT in the last 168 hours Thyroid Function Tests: No results found for this basename: TSH,T4TOTAL,FREET4,T3FREE,THYROIDAB in the last 168 hours Anemia Panel: No results found for this basename: VITAMINB12,FOLATE,FERRITIN,TIBC,IRON,RETICCTPCT in the last 168 hours  Micro Results: No results found for this or any previous visit (from the past 240 hour(s)). Studies/Results: Dg Chest Portable 1 View  12/13/2010  *RADIOLOGY REPORT*  Clinical Data: , abdominal pain, shortness of breath, chest pain, weakness, chills, history hypertension, pulmonary hypertension, HIV  PORTABLE CHEST - 1 VIEW  Comparison: Portable exam 2135 hours compared to 01/26/2010  Findings: Mild enlargement of cardiac silhouette. Atherosclerotic calcification aortic arch. Bilateral hilar enlargement, may represent pulmonary arterial hypertension but hilar adenopathy not excluded. Minimal bronchitic changes and right basilar atelectasis. Linear scarring left mid lung. No definite acute infiltrate or pleural effusion. Surgical clips right axilla. Diffuse osseous demineralization.  IMPRESSION: Enlargement of cardiac silhouette with pulmonary venous hypertension. Bilateral hilar enlargement, a component which may be from pulmonary arterial hypertension but hilar adenopathy not excluded. Bronchitic changes with right basilar atelectasis and left mid lung scarring.  Original Report Authenticated By: Burnetta Sabin, M.D.   Scheduled Meds:    . acetaminophen  650  mg Oral Once  . diphenhydrAMINE  25 mg Oral Once  . fentaNYL  25 mcg Transdermal Q48H  . gabapentin  300 mg Oral Q12H  . lamiVUDine  50 mg Oral Daily  . lanthanum  1,000 mg Oral TID WC  . meperidine      . midazolam      . oxyCODONE  5 mg Oral BID  . pantoprazole  40 mg Oral BID AC  . pneumococcal 23 valent vaccine  0.5 mL Intramuscular Tomorrow-1000  . polyvinyl alcohol  1 drop Both Eyes BID  . raltegravir  400 mg Oral  BID  . sevelamer  800 mg Oral TID WC  . sildenafil  20 mg Oral TID  . sodium chloride      . zidovudine  100 mg Oral TID  . DISCONTD: pantoprazole (PROTONIX) IV  40 mg Intravenous Q12H   Continuous Infusions:  PRN Meds:.sodium chloride, albuterol, ALPRAZolam, ipratropium, morphine, ondansetron (ZOFRAN) IV, DISCONTD: butamben-tetracaine-benzocaine, DISCONTD: meperidine, DISCONTD: midazolam, DISCONTD: simethicone susp in sterile water 1000 mL irrigation Assessment/Plan: Principal Problem:  *UGI bleed Active Problems:  HUMAN IMMUNODEFICIENCY VIRUS [HIV]  ANEMIA, NORMOCYTIC, CHRONIC  ANXIETY  DEPRESSION  HYPERTENSION  GERD  End stage renal disease  PANCREATITIS, HX OF Pulmonary hypertension Erosive reflux esophagitis Mallory-Weiss tear Pancytopenia most likely related to HIV and antiretrovirals  Hematemesis resolved. Lipase normal today. Will monitor intake. Home tomorrow if stable. Had dialysis today. Monitor CBC.  Appreciate consultants.   LOS: 2 days   [name] L 12/15/2010, 4:10 PM

## 2010-12-15 NOTE — Progress Notes (Signed)
UR chart review completed.  

## 2010-12-15 NOTE — Progress Notes (Signed)
Subjective: Interval History: has no complaint of nausea or vomiting. Presently she seems to be feeling better. She denies any shortness of breath orthopnea or paroxysmal nocturnal dyspnea..  Objective: Vital signs in last 24 hours: Temp:  [97.7 F (36.5 C)-98.4 F (36.9 C)] 97.7 F (36.5 C) (10/15 0600) Pulse Rate:  [58-68] 64  (10/15 0600) Resp:  [16] 16  (10/15 0600) BP: (99-109)/(55-65) 105/65 mmHg (10/15 0600) SpO2:  [95 %-100 %] 98 % (10/15 0600) Weight:  [62.2 kg (137 lb 2 oz)] 137 lb 2 oz (62.2 kg) (10/15 0600) Weight change: 0.964 kg (2 lb 2 oz)  Intake/Output from previous day: 10/14 0701 - 10/15 0700 In: 1200 [P.O.:1200] Out: 0  Intake/Output this shift:    General appearance: alert and cooperative Resp: clear to auscultation bilaterally Cardio: regular rate and rhythm, S1, S2 normal, no murmur, click, rub or gallop GI: soft, non-tender; bowel sounds normal; no masses,  no organomegaly Extremities: extremities normal, atraumatic, no cyanosis or edema  Lab Results:  Basename 12/15/10 0500 12/14/10 2001 12/13/10 2148  WBC 2.4* -- 3.8*  HGB 10.2* 10.4* --  HCT 32.3* 32.6* --  PLT 76* -- 87*   BMET:  Basename 12/15/10 0500 12/13/10 2148  NA 135 135  K 4.3 3.6  CL 97 95*  CO2 27 30  GLUCOSE 65* 85  BUN 29* 19  CREATININE 11.66* 8.30*  CALCIUM 9.4 10.1   No results found for this basename: PTH:2 in the last 72 hours Iron Studies: No results found for this basename: IRON,TIBC,TRANSFERRIN,FERRITIN in the last 72 hours  Studies/Results: Dg Chest Portable 1 View  12/13/2010  *RADIOLOGY REPORT*  Clinical Data: , abdominal pain, shortness of breath, chest pain, weakness, chills, history hypertension, pulmonary hypertension, HIV  PORTABLE CHEST - 1 VIEW  Comparison: Portable exam 2135 hours compared to 01/26/2010  Findings: Mild enlargement of cardiac silhouette. Atherosclerotic calcification aortic arch. Bilateral hilar enlargement, may represent pulmonary  arterial hypertension but hilar adenopathy not excluded. Minimal bronchitic changes and right basilar atelectasis. Linear scarring left mid lung. No definite acute infiltrate or pleural effusion. Surgical clips right axilla. Diffuse osseous demineralization.  IMPRESSION: Enlargement of cardiac silhouette with pulmonary venous hypertension. Bilateral hilar enlargement, a component which may be from pulmonary arterial hypertension but hilar adenopathy not excluded. Bronchitic changes with right basilar atelectasis and left mid lung scarring.  Original Report Authenticated By: Burnetta Sabin, M.D.    I have reviewed the patient's current medications.  Assessment/Plan: Problem #1 end-stage renal disease she status post hemodialysis on Friday pending creatinine was in acceptable range normal potassium patient is due for dialysis today. Problem #2 anemia possibly secondary to GI bleeding H&H has this moment is stable Problem #3 history of  hematemesis presently she is feeling better. Since she came to the hospital she didn't have any vomiting and. Problem #4 history of hypertension her blood pressure seems to be controlled very well Problem #5 history of  HIV/AIDS presently she is on antiviral medications.  Problem #6 history of depression Problem #7 history of hyperphosphatemia History of GERD Recommendation we'll do dialysis today . And will continue his other managements. And will follow  blood work in the morning.    LOS: 2 days   [name] S 12/15/2010,8:18 AM

## 2010-12-16 ENCOUNTER — Telehealth: Payer: Self-pay | Admitting: Urgent Care

## 2010-12-16 DIAGNOSIS — D638 Anemia in other chronic diseases classified elsewhere: Secondary | ICD-10-CM

## 2010-12-16 DIAGNOSIS — K208 Other esophagitis without bleeding: Secondary | ICD-10-CM

## 2010-12-16 DIAGNOSIS — K226 Gastro-esophageal laceration-hemorrhage syndrome: Secondary | ICD-10-CM

## 2010-12-16 LAB — CBC
HCT: 33.7 % — ABNORMAL LOW (ref 36.0–46.0)
MCH: 33.2 pg (ref 26.0–34.0)
MCHC: 31.5 g/dL (ref 30.0–36.0)
MCV: 105.6 fL — ABNORMAL HIGH (ref 78.0–100.0)
Platelets: 80 10*3/uL — ABNORMAL LOW (ref 150–400)
RDW: 14.2 % (ref 11.5–15.5)

## 2010-12-16 LAB — BASIC METABOLIC PANEL
BUN: 21 mg/dL (ref 6–23)
CO2: 28 mEq/L (ref 19–32)
Calcium: 9.7 mg/dL (ref 8.4–10.5)
Chloride: 99 mEq/L (ref 96–112)
Creatinine, Ser: 8.59 mg/dL — ABNORMAL HIGH (ref 0.50–1.10)
Glucose, Bld: 81 mg/dL (ref 70–99)

## 2010-12-16 MED ORDER — HEPARIN SODIUM (PORCINE) 1000 UNIT/ML DIALYSIS
1000.0000 [IU] | INTRAMUSCULAR | Status: DC | PRN
  Filled 2010-12-16: qty 1

## 2010-12-16 MED ORDER — EPOETIN ALFA 10000 UNIT/ML IJ SOLN
10000.0000 [IU] | INTRAMUSCULAR | Status: DC
  Filled 2010-12-16: qty 1

## 2010-12-16 MED ORDER — PANTOPRAZOLE SODIUM 40 MG PO TBEC: 40.0000 mg | DELAYED_RELEASE_TABLET | Freq: Two times a day (BID) | ORAL | Status: DC

## 2010-12-16 MED ORDER — HEPARIN SODIUM (PORCINE) 1000 UNIT/ML DIALYSIS
500.0000 [IU]/h | INTRAMUSCULAR | Status: DC | PRN
  Filled 2010-12-16: qty 1

## 2010-12-16 MED ORDER — SODIUM CHLORIDE 0.9 % IV SOLN: 100.0000 mL | INTRAVENOUS | Status: DC | PRN

## 2010-12-16 NOTE — Discharge Summary (Signed)
Physician Discharge Summary  Patient ID: [name] MRN: [id number] DOB/AGE: April 14, 1950 60 y.o.  Admit date: 12/13/2010 Discharge date: 12/16/2010  Discharge Diagnoses:  Principal Problem:  *UGI bleed Active Problems:  Erosive esophagitis  Mallory - Weiss tear  Pancytopenia  HUMAN IMMUNODEFICIENCY VIRUS [HIV]  ANEMIA, NORMOCYTIC, CHRONIC  ANXIETY  DEPRESSION  HYPERTENSION  GERD  End stage renal disease    Current Discharge Medication List    START taking these medications   Details  pantoprazole (PROTONIX) 40 MG tablet Take 1 tablet (40 mg total) by mouth 2 (two) times daily before a meal.   Associated Diagnoses: Esophageal reflux      CONTINUE these medications which have NOT CHANGED   Details  acetaminophen (TYLENOL) 325 MG tablet Take 325 mg by mouth every 4 (four) hours as needed.      b complex-vitamin c-folic acid (NEPHRO-VITE) 0.8 MG TABS Take 0.8 mg by mouth at bedtime.      cholestyramine (QUESTRAN) 4 G packet Take by mouth 2 (two) times daily with a meal. 1/2 scoop     epoetin alfa (EPOGEN) 10000 UNIT/ML injection Inject 10,000 Units into the skin. To be given at dialysis     fentaNYL (DURAGESIC - DOSED MCG/HR) 25 MCG/HR Place 1 patch onto the skin every other day.      Flora-Q (FLORA-Q) CAPS Take 1 capsule by mouth daily.      gabapentin (NEURONTIN) 300 MG capsule Take 300 mg by mouth every 12 (twelve) hours.      hydroxypropyl methylcellulose (ISOPTO TEARS) 2.5 % ophthalmic solution Place 1 drop into both eyes 2 (two) times daily.      lamivudine (EPIVIR) 100 MG tablet Take 0.5 tablets (50 mg total) by mouth daily. Take one half tablet daily Qty: 150 tablet, Refills: 90   Associated Diagnoses: HIV (human immunodeficiency virus infection)    !! lanthanum (FOSRENOL) 500 MG chewable tablet 1,000 mg 3 (three) times daily with meals. Chew 1/2 tablet by mouth three times daily with snacks    lidocaine (LIDODERM) 5 % Place 1 patch onto the skin  daily. Remove & Discard patch within 12 hours or as directed by MD     ondansetron (ZOFRAN) 4 MG tablet 2 mg every 4 (four) hours as needed. nausea    oxycodone (OXY-IR) 5 MG capsule Take 5 mg by mouth 2 (two) times daily.      paricalcitol (ZEMPLAR) 5 MCG/ML injection Inject into the vein.      raltegravir (ISENTRESS) 400 MG tablet Take 400 mg by mouth 2 (two) times daily.      sevelamer (RENVELA) 800 MG tablet Take 800 mg by mouth 3 (three) times daily with meals.      sildenafil (REVATIO) 20 MG tablet Take 20 mg by mouth 3 (three) times daily.      zidovudine (RETROVIR) 100 MG capsule Take 100 mg by mouth 3 (three) times daily.      albuterol (VENTOLIN HFA) 108 (90 BASE) MCG/ACT inhaler Inhale 2 puffs into the lungs every 6 (six) hours as needed. Shortness of breath    ALPRAZolam (XANAX) 0.5 MG tablet Take 0.5 mg by mouth 2 (two) times daily as needed. anxiety    HYDROcodone-acetaminophen (VICODIN) 5-500 MG per tablet Take 1 tablet by mouth every 6 (six) hours as needed.      !! lanthanum (FOSRENOL) 500 MG chewable tablet Chew 500 mg by mouth 3 (three) times daily with meals.       !! - Potential  duplicate medications found. Please discuss with provider.    STOP taking these medications     diltiazem (CARDIZEM) 30 MG tablet      omeprazole (PRILOSEC) 40 MG capsule         Discharge Orders    Future Appointments: Provider: Department: Dept Phone: Center:   01/07/2011 9:15 AM [facility] 1 [facility] [id number] [facility]     Future Orders Please Complete By Expires   Increase activity slowly         Follow-up Information    Follow up with nursing home provider in 1 week.         Disposition: SNF  Discharged Condition: Stable  Consults: Treatment Team:  Daneil Dolin, MD Harriett Sine  Procedures: EGD  Labs:   Results for orders placed during the hospital encounter of 12/13/10 (from the past 48 hour(s))  HEMOGLOBIN AND HEMATOCRIT, BLOOD      Status: Abnormal   Collection Time   12/14/10  8:01 PM      Component Value Range Comment   Hemoglobin 10.4 (*) 12.0 - 15.0 (g/dL)    HCT 32.6 (*) 36.0 - 46.0 (%)   AMYLASE     Status: Abnormal   Collection Time   12/15/10  5:00 AM      Component Value Range Comment   Amylase 154 (*) 0 - 105 (U/L)   LIPASE, BLOOD     Status: Normal   Collection Time   12/15/10  5:00 AM      Component Value Range Comment   Lipase 39  11 - 59 (U/L)   BASIC METABOLIC PANEL     Status: Abnormal   Collection Time   12/15/10  5:00 AM      Component Value Range Comment   Sodium 135  135 - 145 (mEq/L)    Potassium 4.3  3.5 - 5.1 (mEq/L)    Chloride 97  96 - 112 (mEq/L)    CO2 27  19 - 32 (mEq/L)    Glucose, Bld 65 (*) 70 - 99 (mg/dL)    BUN 29 (*) 6 - 23 (mg/dL)    Creatinine, Ser 11.66 (*) 0.50 - 1.10 (mg/dL)    Calcium 9.4  8.4 - 10.5 (mg/dL)    GFR calc non Af Amer 3 (*) >90 (mL/min)    GFR calc Af Amer 4 (*) >90 (mL/min)   CBC     Status: Abnormal   Collection Time   12/15/10  5:00 AM      Component Value Range Comment   WBC 2.4 (*) 4.0 - 10.5 (K/uL)    RBC 3.07 (*) 3.87 - 5.11 (MIL/uL)    Hemoglobin 10.2 (*) 12.0 - 15.0 (g/dL)    HCT 32.3 (*) 36.0 - 46.0 (%)    MCV 105.2 (*) 78.0 - 100.0 (fL)    MCH 33.2  26.0 - 34.0 (pg)    MCHC 31.6  30.0 - 36.0 (g/dL)    RDW 14.0  11.5 - 15.5 (%)    Platelets 76 (*) 150 - 400 (K/uL)   PHOSPHORUS     Status: Abnormal   Collection Time   12/15/10  5:00 AM      Component Value Range Comment   Phosphorus 5.8 (*) 2.3 - 4.6 (mg/dL)   GLUCOSE, CAPILLARY     Status: Normal   Collection Time   12/15/10  9:07 AM      Component Value Range Comment   Glucose-Capillary 73  70 - 99 (  mg/dL)   HEMOGLOBIN AND HEMATOCRIT, BLOOD     Status: Abnormal   Collection Time   12/15/10  8:26 PM      Component Value Range Comment   Hemoglobin 11.3 (*) 12.0 - 15.0 (g/dL)    HCT 35.8 (*) 36.0 - 46.0 (%)   BASIC METABOLIC PANEL     Status: Abnormal   Collection Time    12/16/10  5:54 AM      Component Value Range Comment   Sodium 136  135 - 145 (mEq/L)    Potassium 4.0  3.5 - 5.1 (mEq/L)    Chloride 99  96 - 112 (mEq/L)    CO2 28  19 - 32 (mEq/L)    Glucose, Bld 81  70 - 99 (mg/dL)    BUN 21  6 - 23 (mg/dL)    Creatinine, Ser 8.59 (*) 0.50 - 1.10 (mg/dL)    Calcium 9.7  8.4 - 10.5 (mg/dL)    GFR calc non Af Amer 5 (*) >90 (mL/min)    GFR calc Af Amer 5 (*) >90 (mL/min)   CBC     Status: Abnormal   Collection Time   12/16/10  5:54 AM      Component Value Range Comment   WBC 2.5 (*) 4.0 - 10.5 (K/uL)    RBC 3.19 (*) 3.87 - 5.11 (MIL/uL)    Hemoglobin 10.6 (*) 12.0 - 15.0 (g/dL)    HCT 33.7 (*) 36.0 - 46.0 (%)    MCV 105.6 (*) 78.0 - 100.0 (fL)    MCH 33.2  26.0 - 34.0 (pg)    MCHC 31.5  30.0 - 36.0 (g/dL)    RDW 14.2  11.5 - 15.5 (%)    Platelets 80 (*) 150 - 400 (K/uL)     Diagnostics:  Dg Neck Soft Tissue  11/20/2010  *RADIOLOGY REPORT*  Clinical Data: Throat pain.  NECK SOFT TISSUES - 1+ VIEW  Comparison: 03/08/2008  Findings: No radiopaque foreign body identified. Linear calcific density anterior to the C2-3 disc space is likely degenerative. No prevertebral soft tissue swelling or air.  Thyroid cartilage and epiglottis have a normal radiographic appearance. Mild multilevel degenerative changes of the cervical spine.  No acute osseous abnormality.  IMPRESSION: No radiopaque foreign body or prevertebral soft tissue swelling identified.  Original Report Authenticated By: Suanne Marker, M.D.   Dg Chest Portable 1 View  12/13/2010  *RADIOLOGY REPORT*  Clinical Data: , abdominal pain, shortness of breath, chest pain, weakness, chills, history hypertension, pulmonary hypertension, HIV  PORTABLE CHEST - 1 VIEW  Comparison: Portable exam 2135 hours compared to 01/26/2010  Findings: Mild enlargement of cardiac silhouette. Atherosclerotic calcification aortic arch. Bilateral hilar enlargement, may represent pulmonary arterial hypertension but hilar  adenopathy not excluded. Minimal bronchitic changes and right basilar atelectasis. Linear scarring left mid lung. No definite acute infiltrate or pleural effusion. Surgical clips right axilla. Diffuse osseous demineralization.  IMPRESSION: Enlargement of cardiac silhouette with pulmonary venous hypertension. Bilateral hilar enlargement, a component which may be from pulmonary arterial hypertension but hilar adenopathy not excluded. Bronchitic changes with right basilar atelectasis and left mid lung scarring.  Original Report Authenticated By: Burnetta Sabin, M.D.   EKG: Normal sinus rhythm with PACs and nonspecific changes  Full Code   Hospital Course:   Please see H&P for complete admission details. Ms. Aline Brochure is a 60 year old black female with multiple medical problems from skilled nursing facility who presented with self-limited hematemesis. She reportedly had vomiting initially undigested food.  Subsequently the emesis was bloody. She was admitted to the hospitalist service and GI was consulted. She had an EGD which showed erosive reflux esophagitis and Mallory-Weiss tear. She had previously been on Prilosec. This was switched to proton X. 40 mg by mouth twice a day. Her amylase and lipase were slightly elevated, but this was felt to be likely secondary to renal failure rather than pancreatitis. She had no complaints of epigastric pain. Her hemoglobin dropped slightly but has been stable between 10 and 11. She has pancytopenia which is likely related to HIV and her antiretrovirals. This is chronic. Patient has no further nausea or vomiting. Her appetite has been poor, but she reports that this is due to sadness over her lack of contact with her family members her rarely visit her. It may be worthwhile to consider antidepressants, but will defer to her primary care provider.  Her blood pressure was borderline low. She was asymptomatic, but I have stopped her Cardizem.  She has end-stage renal disease  and had her usually scheduled dialysis on Monday. She is due tomorrow, Wednesday.  She is been cleared for discharge by GI. I suspect her mood and appetite will improve once she is back at skilled nursing facility where she is surrounded by friends. Total time on the day of discharge is greater than 30 minutes.  Discharge Exam: Blood pressure 92/66, pulse 72, temperature 98.3 F (36.8 C), temperature source Oral, resp. rate 18, height 5\' 2"  (1.575 m), weight 61.8 kg (136 lb 3.9 oz), SpO2 95.00%.  Tearful. Otherwise exam unchanged from 12/15/2010   Signed: Doree Barthel L 12/16/2010, 11:17 AM

## 2010-12-16 NOTE — Progress Notes (Signed)
Subjective: Interval History: has no complaint of nausea or vomiting. Overall she's feeling better. She denies any shortness of breath orthopnea..  Objective: Vital signs in last 24 hours: Temp:  [98.3 F (36.8 C)-99 F (37.2 C)] 98.3 F (36.8 C) (10/16 0629) Pulse Rate:  [61-81] 72  (10/16 0629) Resp:  [16-18] 18  (10/16 0629) BP: (74-116)/(40-66) 92/66 mmHg (10/16 0629) SpO2:  [95 %-97 %] 95 % (10/16 0629) Weight:  [61.8 kg (136 lb 3.9 oz)-62.2 kg (137 lb 2 oz)] 136 lb 3.9 oz (61.8 kg) (10/15 1500) Weight change: 0 kg (0 lb)  Intake/Output from previous day: 10/15 0701 - 10/16 0700 In: 60 [P.O.:60] Out: 1400  Intake/Output this shift:    General appearance: alert and cooperative Resp: clear to auscultation bilaterally Cardio: regular rate and rhythm, S1, S2 normal, no murmur, click, rub or gallop GI: soft, non-tender; bowel sounds normal; no masses,  no organomegaly Extremities: extremities normal, atraumatic, no cyanosis or edema  Lab Results:  Basename 12/16/10 0554 12/15/10 2026 12/15/10 0500  WBC 2.5* -- 2.4*  HGB 10.6* 11.3* --  HCT 33.7* 35.8* --  PLT 80* -- 76*   BMET:  Basename 12/16/10 0554 12/15/10 0500  NA 136 135  K 4.0 4.3  CL 99 97  CO2 28 27  GLUCOSE 81 65*  BUN 21 29*  CREATININE 8.59* 11.66*  CALCIUM 9.7 9.4   No results found for this basename: PTH:2 in the last 72 hours Iron Studies: No results found for this basename: IRON,TIBC,TRANSFERRIN,FERRITIN in the last 72 hours  Studies/Results: No results found.  I have reviewed the patient's current medications.  Assessment/Plan: Problem #1 end-stage renal disease she status post hemodialysis yesterday pending creatinine is was in acceptable range normal potassium. Problem #2 history of anemia possibly secondary to GI bleeding and anemia of chronic disease. Patient is status post endoscopy yesterday she seemed to have erosive gastritis. H&H seems to be declining. Problem #3 history of  hypertension blood pressure seems to be controlled very well Problem #4 history of GERD Problem #5 history of depression Problem #6 history of her chronic pancreatitis Problem #7 history of pancytopenia Problem #8 history of HIV/AIDS. Recommendation we'll make arrangements for patient to get dialysis tomorrow Will restart her on Epogen. The patient is going to be discharged she'll get her dialysis as an outpatient tomorrow.    LOS: 3 days   [name] S 12/16/2010,9:27 AM

## 2010-12-16 NOTE — Telephone Encounter (Signed)
Caregiver is aware of OV on 12/18 at 1030 with KJ

## 2010-12-16 NOTE — Telephone Encounter (Signed)
Needs OV 2 months FU hospitalization Erosive reflux/Mallory Weiss/anemia

## 2010-12-16 NOTE — Progress Notes (Signed)
Subjective: Pt denies any nausea or vomiting.  Denies any abdominal pain.  C/o anorexia.  Objective: Vital signs in last 24 hours: Temp:  [98.3 F (36.8 C)-99 F (37.2 C)] 98.3 F (36.8 C) (10/16 0629) Pulse Rate:  [61-81] 72  (10/16 0629) Resp:  [16-18] 18  (10/16 0629) BP: (74-116)/(40-66) 92/66 mmHg (10/16 0629) SpO2:  [95 %-97 %] 95 % (10/16 0629) Weight:  [136 lb 3.9 oz (61.8 kg)-137 lb 2 oz (62.2 kg)] 136 lb 3.9 oz (61.8 kg) (10/15 1500) Last BM Date: 12/14/10 General:   Alert,  Well-developed, well-nourished, pleasant and cooperative in NAD Head:  Normocephalic and atraumatic. Eyes:  Sclera clear, no icterus.   Conjunctiva pink. Mouth:  No deformity or lesions, OP pink/moist. Heart:  Regular rate and rhythm; no murmurs, clicks, rubs,  or gallops. Abdomen:  Soft, nontender and nondistended. No masses, hepatosplenomegaly or hernias noted. Normal bowel sounds, without guarding, and without rebound.   Extremities:  Without edema. Neurologic:  Alert and  oriented x4;  grossly normal neurologically. Skin:  Intact without significant lesions or rashes. Psych:  Alert and cooperative. Normal mood and affect.  Intake/Output from previous day: 10/15 0701 - 10/16 0700 In: 60 [P.O.:60] Out: 1400  ILab Results:  Basename 12/16/10 0554 12/15/10 2026 12/15/10 0500 12/13/10 2148  WBC 2.5* -- 2.4* 3.8*  HGB 10.6* 11.3* 10.2* --  HCT 33.7* 35.8* 32.3* --  PLT 80* -- 76* 87*   BMET  Basename 12/16/10 0554 12/15/10 0500 12/13/10 2148  NA 136 135 135  K 4.0 4.3 3.6  CL 99 97 95*  CO2 28 27 30   GLUCOSE 81 65* 85  BUN 21 29* 19  CREATININE 8.59* 11.66* 8.30*  CALCIUM 9.7 9.4 10.1   LFT  Basename 12/13/10 2148  PROT 10.2*  ALBUMIN 3.1*  AST 31  ALT 29  ALKPHOS 93  BILITOT 0.4  BILIDIR --  IBILI --   PT/INR  Basename 12/13/10 2148  LABPROT 14.4  INR 1.10   Assessment: 1) Erosive esophagitis: On BID PPI. 2) Mallory - Weiss tear: Culprit of hematemesis; Resolved. 3)  Anemia of CKD: Stable.  Plan: 1) Continue BID prontonix 40mg  2) Will schedule follow up in our office in 2 months 3) OK for DC home soon  LOS: 3 days   Vickey Huger  12/16/2010, 9:48 AM

## 2010-12-16 NOTE — Consult Note (Signed)
Pt d/c today by MD back to [facility].  Pt and facility aware and agreeable and facility to transport.  CSW talked with pt as she was tearful and requesting to see her sister as she lives in [place].  CSW called [facility] but they were unable to locate sister's number in pt's belongings at facility.  Jacqulynn Cadet at [facility] will arrange for pt to see facility psychiatrist due to depressive symptoms.    Salome Arnt

## 2010-12-24 ENCOUNTER — Encounter: Payer: Self-pay | Admitting: Internal Medicine

## 2010-12-25 ENCOUNTER — Encounter (HOSPITAL_COMMUNITY): Payer: Self-pay | Admitting: Internal Medicine

## 2011-01-07 ENCOUNTER — Encounter (HOSPITAL_COMMUNITY): Payer: Medicare Other

## 2011-02-17 ENCOUNTER — Inpatient Hospital Stay: Payer: Medicare Other | Admitting: Urgent Care

## 2011-02-17 ENCOUNTER — Telehealth: Payer: Self-pay | Admitting: Internal Medicine

## 2011-02-17 NOTE — Telephone Encounter (Signed)
Noted  

## 2011-02-17 NOTE — Telephone Encounter (Signed)
Colletta Maryland from [facility] called this morning to let me know that patient was refusing to come to her OV today at 1030 to see KJ. Patient was complaining that she wasn't aware of OV. Colletta Maryland said she had been reminding patient for days of this appointment and also had a reminder call yesterday. She explained to patient the importance of her keeping this OV, but patient is still refusing. OV was cancelled.

## 2011-03-04 DIAGNOSIS — D472 Monoclonal gammopathy: Secondary | ICD-10-CM | POA: Diagnosis not present

## 2011-03-04 DIAGNOSIS — M6281 Muscle weakness (generalized): Secondary | ICD-10-CM | POA: Diagnosis not present

## 2011-03-04 DIAGNOSIS — D631 Anemia in chronic kidney disease: Secondary | ICD-10-CM | POA: Diagnosis not present

## 2011-03-04 DIAGNOSIS — D509 Iron deficiency anemia, unspecified: Secondary | ICD-10-CM | POA: Diagnosis not present

## 2011-03-04 DIAGNOSIS — N186 End stage renal disease: Secondary | ICD-10-CM | POA: Diagnosis not present

## 2011-03-04 DIAGNOSIS — N2581 Secondary hyperparathyroidism of renal origin: Secondary | ICD-10-CM | POA: Diagnosis not present

## 2011-03-05 DIAGNOSIS — D509 Iron deficiency anemia, unspecified: Secondary | ICD-10-CM | POA: Diagnosis not present

## 2011-03-05 DIAGNOSIS — D649 Anemia, unspecified: Secondary | ICD-10-CM | POA: Diagnosis not present

## 2011-03-05 DIAGNOSIS — M6281 Muscle weakness (generalized): Secondary | ICD-10-CM | POA: Diagnosis not present

## 2011-03-07 DIAGNOSIS — M6281 Muscle weakness (generalized): Secondary | ICD-10-CM | POA: Diagnosis not present

## 2011-03-09 ENCOUNTER — Emergency Department (HOSPITAL_COMMUNITY): Payer: Medicare Other

## 2011-03-09 ENCOUNTER — Encounter (HOSPITAL_COMMUNITY): Payer: Self-pay | Admitting: *Deleted

## 2011-03-09 ENCOUNTER — Inpatient Hospital Stay (HOSPITAL_COMMUNITY)
Admission: EM | Admit: 2011-03-09 | Discharge: 2011-03-12 | DRG: 600 | Disposition: A | Discharge status: Skilled Nursing Facility | Payer: Medicare Other | Attending: Internal Medicine | Admitting: Internal Medicine

## 2011-03-09 ENCOUNTER — Other Ambulatory Visit: Payer: Self-pay

## 2011-03-09 DIAGNOSIS — N63 Unspecified lump in unspecified breast: Secondary | ICD-10-CM | POA: Diagnosis not present

## 2011-03-09 DIAGNOSIS — G894 Chronic pain syndrome: Secondary | ICD-10-CM | POA: Diagnosis not present

## 2011-03-09 DIAGNOSIS — I120 Hypertensive chronic kidney disease with stage 5 chronic kidney disease or end stage renal disease: Secondary | ICD-10-CM | POA: Diagnosis present

## 2011-03-09 DIAGNOSIS — N039 Chronic nephritic syndrome with unspecified morphologic changes: Secondary | ICD-10-CM | POA: Diagnosis not present

## 2011-03-09 DIAGNOSIS — D696 Thrombocytopenia, unspecified: Secondary | ICD-10-CM | POA: Diagnosis present

## 2011-03-09 DIAGNOSIS — E871 Hypo-osmolality and hyponatremia: Secondary | ICD-10-CM | POA: Diagnosis present

## 2011-03-09 DIAGNOSIS — N2581 Secondary hyperparathyroidism of renal origin: Secondary | ICD-10-CM | POA: Diagnosis present

## 2011-03-09 DIAGNOSIS — R52 Pain, unspecified: Secondary | ICD-10-CM | POA: Diagnosis not present

## 2011-03-09 DIAGNOSIS — K439 Ventral hernia without obstruction or gangrene: Secondary | ICD-10-CM

## 2011-03-09 DIAGNOSIS — K219 Gastro-esophageal reflux disease without esophagitis: Secondary | ICD-10-CM

## 2011-03-09 DIAGNOSIS — M6281 Muscle weakness (generalized): Secondary | ICD-10-CM | POA: Diagnosis not present

## 2011-03-09 DIAGNOSIS — B20 Human immunodeficiency virus [HIV] disease: Secondary | ICD-10-CM

## 2011-03-09 DIAGNOSIS — K922 Gastrointestinal hemorrhage, unspecified: Secondary | ICD-10-CM

## 2011-03-09 DIAGNOSIS — M25529 Pain in unspecified elbow: Secondary | ICD-10-CM

## 2011-03-09 DIAGNOSIS — F329 Major depressive disorder, single episode, unspecified: Secondary | ICD-10-CM

## 2011-03-09 DIAGNOSIS — R229 Localized swelling, mass and lump, unspecified: Secondary | ICD-10-CM

## 2011-03-09 DIAGNOSIS — F3289 Other specified depressive episodes: Secondary | ICD-10-CM

## 2011-03-09 DIAGNOSIS — F1010 Alcohol abuse, uncomplicated: Secondary | ICD-10-CM

## 2011-03-09 DIAGNOSIS — D631 Anemia in chronic kidney disease: Secondary | ICD-10-CM | POA: Diagnosis present

## 2011-03-09 DIAGNOSIS — Z8639 Personal history of other endocrine, nutritional and metabolic disease: Secondary | ICD-10-CM

## 2011-03-09 DIAGNOSIS — R51 Headache: Secondary | ICD-10-CM

## 2011-03-09 DIAGNOSIS — D72819 Decreased white blood cell count, unspecified: Secondary | ICD-10-CM | POA: Diagnosis present

## 2011-03-09 DIAGNOSIS — E8809 Other disorders of plasma-protein metabolism, not elsewhere classified: Secondary | ICD-10-CM | POA: Diagnosis not present

## 2011-03-09 DIAGNOSIS — I10 Essential (primary) hypertension: Secondary | ICD-10-CM | POA: Diagnosis not present

## 2011-03-09 DIAGNOSIS — Z87898 Personal history of other specified conditions: Secondary | ICD-10-CM

## 2011-03-09 DIAGNOSIS — I509 Heart failure, unspecified: Secondary | ICD-10-CM

## 2011-03-09 DIAGNOSIS — R079 Chest pain, unspecified: Secondary | ICD-10-CM | POA: Diagnosis not present

## 2011-03-09 DIAGNOSIS — F411 Generalized anxiety disorder: Secondary | ICD-10-CM

## 2011-03-09 DIAGNOSIS — N61 Mastitis without abscess: Secondary | ICD-10-CM | POA: Diagnosis not present

## 2011-03-09 DIAGNOSIS — Z862 Personal history of diseases of the blood and blood-forming organs and certain disorders involving the immune mechanism: Secondary | ICD-10-CM

## 2011-03-09 DIAGNOSIS — K2210 Ulcer of esophagus without bleeding: Secondary | ICD-10-CM

## 2011-03-09 DIAGNOSIS — K859 Acute pancreatitis without necrosis or infection, unspecified: Secondary | ICD-10-CM

## 2011-03-09 DIAGNOSIS — Z9079 Acquired absence of other genital organ(s): Secondary | ICD-10-CM

## 2011-03-09 DIAGNOSIS — Z21 Asymptomatic human immunodeficiency virus [HIV] infection status: Secondary | ICD-10-CM

## 2011-03-09 DIAGNOSIS — N6489 Other specified disorders of breast: Secondary | ICD-10-CM | POA: Diagnosis not present

## 2011-03-09 DIAGNOSIS — K226 Gastro-esophageal laceration-hemorrhage syndrome: Secondary | ICD-10-CM

## 2011-03-09 DIAGNOSIS — D649 Anemia, unspecified: Secondary | ICD-10-CM

## 2011-03-09 DIAGNOSIS — J189 Pneumonia, unspecified organism: Secondary | ICD-10-CM

## 2011-03-09 DIAGNOSIS — N186 End stage renal disease: Secondary | ICD-10-CM | POA: Diagnosis not present

## 2011-03-09 DIAGNOSIS — Z992 Dependence on renal dialysis: Secondary | ICD-10-CM

## 2011-03-09 DIAGNOSIS — N289 Disorder of kidney and ureter, unspecified: Secondary | ICD-10-CM

## 2011-03-09 DIAGNOSIS — Z87891 Personal history of nicotine dependence: Secondary | ICD-10-CM

## 2011-03-09 DIAGNOSIS — R197 Diarrhea, unspecified: Secondary | ICD-10-CM

## 2011-03-09 DIAGNOSIS — C50919 Malignant neoplasm of unspecified site of unspecified female breast: Secondary | ICD-10-CM | POA: Diagnosis present

## 2011-03-09 DIAGNOSIS — Z8719 Personal history of other diseases of the digestive system: Secondary | ICD-10-CM

## 2011-03-09 DIAGNOSIS — R599 Enlarged lymph nodes, unspecified: Secondary | ICD-10-CM

## 2011-03-09 DIAGNOSIS — N644 Mastodynia: Secondary | ICD-10-CM | POA: Diagnosis not present

## 2011-03-09 DIAGNOSIS — J209 Acute bronchitis, unspecified: Secondary | ICD-10-CM

## 2011-03-09 DIAGNOSIS — N6459 Other signs and symptoms in breast: Secondary | ICD-10-CM | POA: Diagnosis not present

## 2011-03-09 LAB — BASIC METABOLIC PANEL
Calcium: 10.4 mg/dL (ref 8.4–10.5)
GFR calc Af Amer: 3 mL/min — ABNORMAL LOW (ref >90)
GFR calc non Af Amer: 3 mL/min — ABNORMAL LOW (ref >90)
Glucose, Bld: 79 mg/dL (ref 70–99)
Potassium: 4 mEq/L (ref 3.5–5.1)
Sodium: 131 mEq/L — ABNORMAL LOW (ref 135–145)

## 2011-03-09 LAB — CBC
Hemoglobin: 10.6 g/dL — ABNORMAL LOW (ref 12.0–15.0)
MCH: 32.9 pg (ref 26.0–34.0)
MCHC: 32.5 g/dL (ref 30.0–36.0)
Platelets: 102 10*3/uL — ABNORMAL LOW (ref 150–400)
RDW: 14.2 % (ref 11.5–15.5)

## 2011-03-09 LAB — HEPATIC FUNCTION PANEL
Albumin: 3.2 g/dL — ABNORMAL LOW (ref 3.5–5.2)
Total Bilirubin: 0.6 mg/dL (ref 0.3–1.2)

## 2011-03-09 LAB — CULTURE, BLOOD (ROUTINE X 2)

## 2011-03-09 LAB — PRO B NATRIURETIC PEPTIDE: Pro B Natriuretic peptide (BNP): 3673 pg/mL — ABNORMAL HIGH (ref 0–125)

## 2011-03-09 IMAGING — CR DG CHEST 2V
2 series · 2 of 2 positions shown · non-contrast
Comparison: [DATE] and [DATE]

CLINICAL DATA: Chest pain.  History of CHF.

CHEST - 2 VIEW

[view not recorded (1 of 2)]
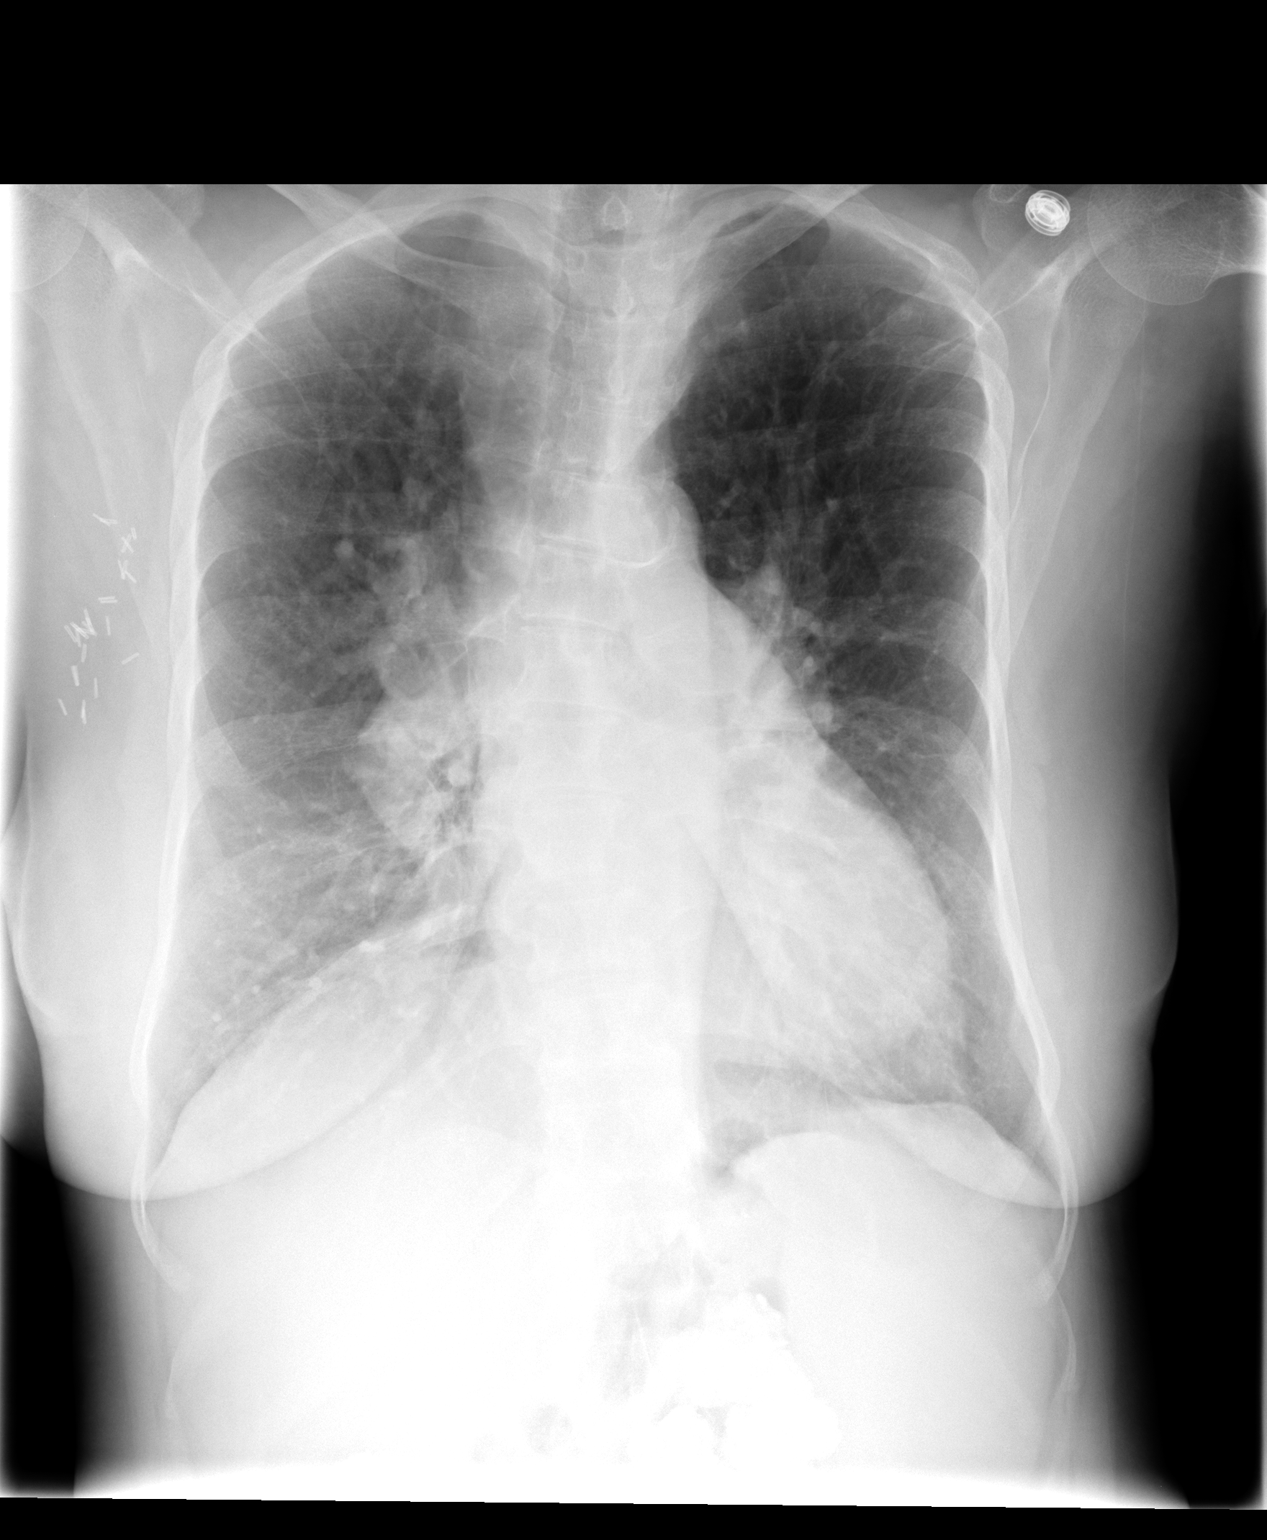

[view not recorded (2 of 2)]
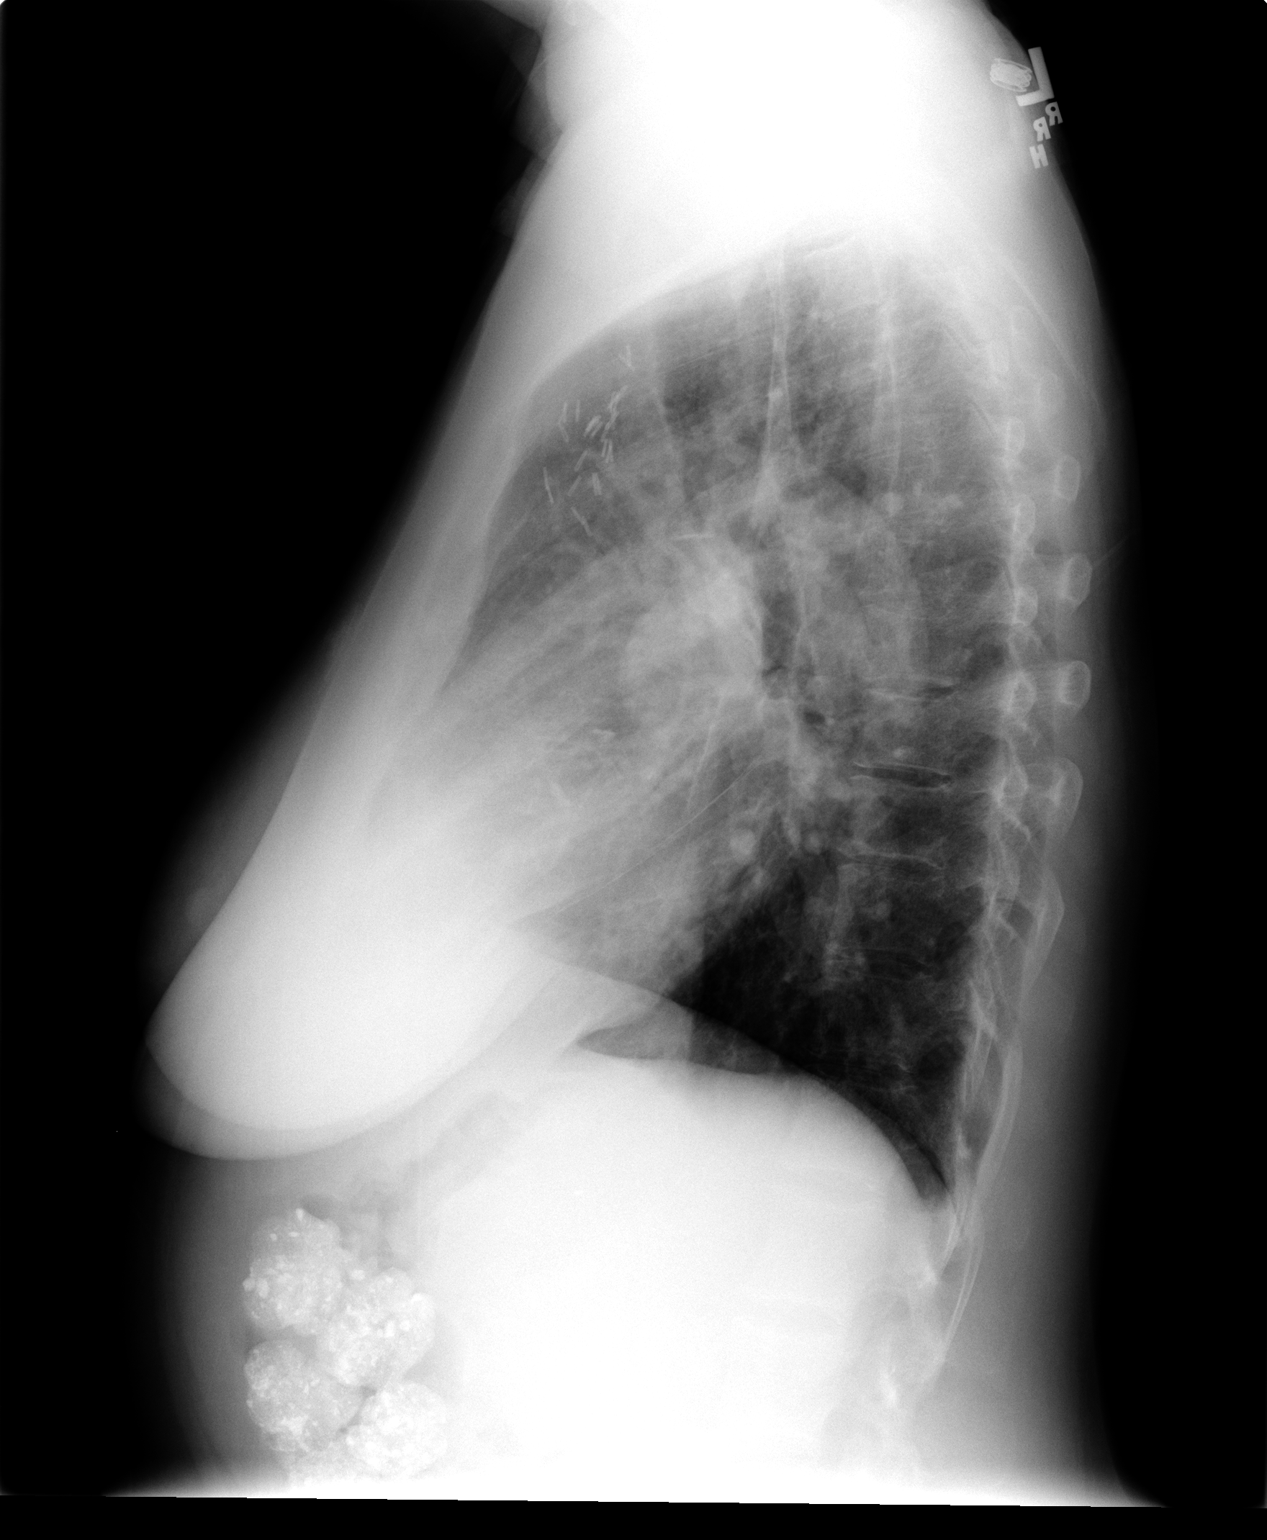

[2 of 2 positions shown; findings below may reference images not displayed]

FINDINGS: Two views of the chest were obtained.  There is chronic
enlargement of the hilar structures.  There is no evidence of focal
airspace disease.  Heart and mediastinum are grossly stable.
Atherosclerotic calcifications involving the thoracic aortic arch.
Surgical clips in the right axilla.  High density material in the
colon.  No evidence to suggest pleural effusions.
IMPRESSION: Stable chest radiograph findings without acute changes.

Chronic enlargement of the hilar structures.

## 2011-03-09 MED ORDER — ZIDOVUDINE 100 MG PO CAPS
100.0000 mg | ORAL_CAPSULE | Freq: Three times a day (TID) | ORAL | Status: DC
  Administered 2011-03-10 – 2011-03-12 (×7): 100 mg via ORAL
  Filled 2011-03-09 (×12): qty 1

## 2011-03-09 MED ORDER — SEVELAMER CARBONATE 800 MG PO TABS
800.0000 mg | ORAL_TABLET | Freq: Three times a day (TID) | ORAL | Status: DC
  Administered 2011-03-10 – 2011-03-12 (×7): 800 mg via ORAL
  Filled 2011-03-09 (×8): qty 1
  Filled 2011-03-09: qty 2
  Filled 2011-03-09 (×2): qty 1

## 2011-03-09 MED ORDER — ALBUTEROL SULFATE HFA 108 (90 BASE) MCG/ACT IN AERS
2.0000 | INHALATION_SPRAY | Freq: Four times a day (QID) | RESPIRATORY_TRACT | Status: DC | PRN
  Administered 2011-03-09: 2 via RESPIRATORY_TRACT
  Filled 2011-03-09: qty 6.7

## 2011-03-09 MED ORDER — SODIUM CHLORIDE 0.9 % IV SOLN
INTRAVENOUS | Status: DC
  Administered 2011-03-09 – 2011-03-10 (×2): via INTRAVENOUS

## 2011-03-09 MED ORDER — VANCOMYCIN HCL IN DEXTROSE 1-5 GM/200ML-% IV SOLN
1000.0000 mg | Freq: Once | INTRAVENOUS | Status: AC
  Administered 2011-03-09: 1000 mg via INTRAVENOUS
  Filled 2011-03-09: qty 200

## 2011-03-09 MED ORDER — SILDENAFIL CITRATE 20 MG PO TABS
20.0000 mg | ORAL_TABLET | Freq: Three times a day (TID) | ORAL | Status: DC
  Administered 2011-03-10 – 2011-03-12 (×7): 20 mg via ORAL
  Filled 2011-03-09 (×12): qty 1

## 2011-03-09 MED ORDER — HYDROCODONE-ACETAMINOPHEN 5-325 MG PO TABS
1.0000 | ORAL_TABLET | ORAL | Status: DC | PRN
  Administered 2011-03-10 – 2011-03-12 (×3): 1 via ORAL
  Filled 2011-03-09 (×3): qty 1

## 2011-03-09 MED ORDER — PANTOPRAZOLE SODIUM 40 MG PO TBEC
40.0000 mg | DELAYED_RELEASE_TABLET | Freq: Two times a day (BID) | ORAL | Status: DC
  Administered 2011-03-10 – 2011-03-12 (×5): 40 mg via ORAL
  Filled 2011-03-09 (×5): qty 1

## 2011-03-09 MED ORDER — CHOLESTYRAMINE 4 G PO PACK
4.0000 g | PACK | Freq: Two times a day (BID) | ORAL | Status: DC
  Filled 2011-03-09 (×4): qty 1

## 2011-03-09 MED ORDER — LIDOCAINE 5 % EX PTCH
1.0000 | MEDICATED_PATCH | CUTANEOUS | Status: DC
  Administered 2011-03-10 – 2011-03-12 (×3): 1 via TRANSDERMAL
  Filled 2011-03-09 (×4): qty 1

## 2011-03-09 MED ORDER — FENTANYL 25 MCG/HR TD PT72
25.0000 ug | MEDICATED_PATCH | TRANSDERMAL | Status: DC
  Administered 2011-03-11: 25 ug via TRANSDERMAL
  Filled 2011-03-09: qty 1

## 2011-03-09 MED ORDER — CHOLESTYRAMINE 4 G PO PACK
4.0000 g | PACK | ORAL | Status: DC
  Administered 2011-03-09: 4 g via ORAL
  Administered 2011-03-10: 1 g via ORAL
  Administered 2011-03-10 – 2011-03-12 (×3): 4 g via ORAL
  Filled 2011-03-09 (×8): qty 1

## 2011-03-09 MED ORDER — EPOETIN ALFA 10000 UNIT/ML IJ SOLN
10000.0000 [IU] | Freq: Once | INTRAMUSCULAR | Status: DC
  Filled 2011-03-09 (×2): qty 1

## 2011-03-09 MED ORDER — HYPROMELLOSE (GONIOSCOPIC) 2.5 % OP SOLN
1.0000 [drp] | Freq: Two times a day (BID) | OPHTHALMIC | Status: DC
  Filled 2011-03-09: qty 15

## 2011-03-09 MED ORDER — RENA-VITE PO TABS
1.0000 | ORAL_TABLET | Freq: Every day | ORAL | Status: DC
  Administered 2011-03-10 – 2011-03-12 (×3): 1 via ORAL
  Filled 2011-03-09 (×8): qty 1

## 2011-03-09 MED ORDER — LANTHANUM CARBONATE 500 MG PO CHEW
3000.0000 mg | CHEWABLE_TABLET | Freq: Three times a day (TID) | ORAL | Status: DC
  Administered 2011-03-10 (×2): 3000 mg via ORAL
  Filled 2011-03-09 (×12): qty 6

## 2011-03-09 MED ORDER — GABAPENTIN 300 MG PO CAPS
300.0000 mg | ORAL_CAPSULE | Freq: Two times a day (BID) | ORAL | Status: DC
  Administered 2011-03-09 – 2011-03-12 (×6): 300 mg via ORAL
  Filled 2011-03-09 (×9): qty 1

## 2011-03-09 MED ORDER — VANCOMYCIN HCL IN DEXTROSE 1-5 GM/200ML-% IV SOLN
1000.0000 mg | Freq: Once | INTRAVENOUS | Status: DC
  Filled 2011-03-09: qty 200

## 2011-03-09 MED ORDER — FLORA-Q PO CAPS
1.0000 | ORAL_CAPSULE | Freq: Every day | ORAL | Status: DC
Start: 2011-03-09 — End: 2011-03-12
  Administered 2011-03-10 – 2011-03-12 (×3): 1 via ORAL
  Filled 2011-03-09 (×6): qty 1

## 2011-03-09 MED ORDER — POLYVINYL ALCOHOL 1.4 % OP SOLN
1.0000 [drp] | Freq: Two times a day (BID) | OPHTHALMIC | Status: DC
  Administered 2011-03-09 – 2011-03-12 (×6): 1 [drp] via OPHTHALMIC
  Filled 2011-03-09 (×2): qty 15

## 2011-03-09 MED ORDER — RALTEGRAVIR POTASSIUM 400 MG PO TABS
400.0000 mg | ORAL_TABLET | Freq: Two times a day (BID) | ORAL | Status: DC
  Administered 2011-03-10 – 2011-03-12 (×6): 400 mg via ORAL
  Filled 2011-03-09 (×8): qty 1

## 2011-03-09 MED ORDER — HEPARIN SODIUM (PORCINE) 5000 UNIT/ML IJ SOLN
5000.0000 [IU] | Freq: Three times a day (TID) | INTRAMUSCULAR | Status: DC
  Administered 2011-03-09 – 2011-03-12 (×7): 5000 [IU] via SUBCUTANEOUS
  Filled 2011-03-09 (×12): qty 1

## 2011-03-09 MED ORDER — PARICALCITOL 5 MCG/ML IV SOLN
5.0000 ug | Freq: Once | INTRAVENOUS | Status: DC
  Filled 2011-03-09: qty 1

## 2011-03-09 MED ORDER — LAMIVUDINE 100 MG PO TABS
50.0000 mg | ORAL_TABLET | Freq: Every day | ORAL | Status: DC
  Filled 2011-03-09 (×3): qty 1

## 2011-03-09 MED ORDER — LAMIVUDINE 10 MG/ML PO SOLN
50.0000 mg | Freq: Every day | ORAL | Status: DC
  Filled 2011-03-09 (×5): qty 5

## 2011-03-09 MED ORDER — ONDANSETRON HCL 4 MG PO TABS: 4.0000 mg | ORAL_TABLET | Freq: Four times a day (QID) | ORAL | Status: DC | PRN

## 2011-03-09 MED ORDER — ONDANSETRON HCL 4 MG PO TABS: 2.0000 mg | ORAL_TABLET | ORAL | Status: DC | PRN

## 2011-03-09 MED ORDER — ALPRAZOLAM 0.5 MG PO TABS
0.5000 mg | ORAL_TABLET | Freq: Two times a day (BID) | ORAL | Status: DC | PRN
  Administered 2011-03-10: 0.5 mg via ORAL
  Filled 2011-03-09 (×3): qty 1

## 2011-03-09 MED ORDER — ACETAMINOPHEN 325 MG PO TABS
325.0000 mg | ORAL_TABLET | ORAL | Status: DC | PRN
  Filled 2011-03-09: qty 1

## 2011-03-09 MED ORDER — ONDANSETRON HCL 4 MG/2ML IJ SOLN
4.0000 mg | Freq: Four times a day (QID) | INTRAMUSCULAR | Status: DC | PRN
  Administered 2011-03-10: 4 mg via INTRAVENOUS
  Filled 2011-03-09: qty 2

## 2011-03-09 NOTE — Progress Notes (Addendum)
ANTIBIOTIC CONSULT NOTE - INITIAL  Pharmacy Consult for Vancomycin Indication:  cellulitis  Allergies  Allergen Reactions  . Penicillins     REACTION: rash  . Latex Rash    Patient Measurements: Height: 5\' 2"  (157.5 cm) Weight: 140 lb (63.504 kg) IBW/kg (Calculated) : 50.1  Adjusted Body Weight:n/a  Vital Signs: Temp: 98.1 F (36.7 C) (01/07 1326) Temp src: Oral (01/07 1326) BP: 141/83 mmHg (01/07 1326) Pulse Rate: 82  (01/07 1326) Intake/Output from previous day:   Intake/Output from this shift:    Labs:  Basename 03/09/11 1500  WBC 5.4  HGB 10.6*  PLT 102*  LABCREA --  CREATININE 14.01*   Estimated Creatinine Clearance: 3.8 ml/min (by C-G formula based on Cr of 14.01).    Microbiology: No results found for this or any previous visit (from the past 720 hour(s)).  Medical History: Past Medical History  Diagnosis Date  . Pulmonary HTN   . Renal disorder   . Hypertension   . Depression   . Insomnia   . Chronic diarrhea   . HIV (human immunodeficiency virus infection)     Medications:  Scheduled:    . cholestyramine  4 g Oral BID  . epoetin alfa  10,000 Units Subcutaneous Once  . fentaNYL  25 mcg Transdermal Q48H  . Flora-Q  1 capsule Oral Daily  . gabapentin  300 mg Oral Q12H  . heparin  5,000 Units Subcutaneous Q8H  . hydroxypropyl methylcellulose  1 drop Both Eyes BID  . lamivudine  50 mg Oral Daily  . lanthanum  3,000 mg Oral TID WC  . lidocaine  1 patch Transdermal Q24H  . multivitamin  1 tablet Oral Daily  . pantoprazole  40 mg Oral BID AC  . paricalcitol  5 mcg Intravenous Once in dialysis  . raltegravir  400 mg Oral BID  . sevelamer  800 mg Oral TID WC  . sildenafil  20 mg Oral TID  . zidovudine  100 mg Oral TID   Assessment: Empiric therapy for cellulitis.  Goal of Therapy:  Vancomycin trough level 15-20 mcg/ml  Plan:   Vancomycin 1 gram tonight after HD (if goes today). Follow up HD schedule.  Briscoe Burns, [place]  J 03/09/2011,5:19 PM

## 2011-03-09 NOTE — ED Provider Notes (Signed)
History     CSN: [id number]  Arrival date & time 03/09/11  1324   First MD Initiated Contact with Patient 03/09/11 1355      Chief Complaint  Patient presents with  . Breast Pain    (Consider location/radiation/quality/duration/timing/severity/associated sxs/prior treatment) The history is provided by the patient.   patient is a 61 year old female resident of [facility] with complaint of right breast redness swelling and tenderness for 2 days. Patient is a dialysis patient and normally gets dialyzed Monday Wednesdays and Friday she was not dialyzed today came here instead. Patient has no other complaints. The left breast pain is 2/10 and less palpated and the pain increases to 10 out of 10.   Review of patient's past history shows that she's had breast cellulitis in the past. Patient is alert and responsive to questions in the ED.  Patient has no other complaints denies chest pain other than the pain in the right breast shortness of breath fever back pain leg swelling.  Past Medical History  Diagnosis Date  . Pulmonary HTN   . Renal disorder   . Hypertension   . Depression   . Insomnia   . Chronic diarrhea   . HIV (human immunodeficiency virus infection)     Past Surgical History  Procedure Date  . Dialysis shunts   . Esophagogastroduodenoscopy 12/15/2010    Procedure: ESOPHAGOGASTRODUODENOSCOPY (EGD);  Surgeon: Daneil Dolin, MD;  Location: [facility];  Service: Endoscopy;  Laterality: N/A;    Family History  Problem Relation Age of Onset  . Anesthesia problems Neg Hx   . Hypotension Neg Hx   . Malignant hyperthermia Neg Hx   . Pseudochol deficiency Neg Hx     History  Substance Use Topics  . Smoking status: Never Smoker   . Smokeless tobacco: Never Used  . Alcohol Use: No    OB History    Grav Para Term Preterm Abortions TAB SAB Ect Mult Living   1 1 1       1       Review of Systems  Constitutional: Negative for fever.  HENT: Negative  for congestion and neck pain.   Eyes: Negative for redness.  Respiratory: Negative for cough and shortness of breath.   Cardiovascular: Positive for chest pain. Negative for leg swelling.  Gastrointestinal: Negative for nausea, vomiting, abdominal pain and constipation.  Genitourinary: Negative for dysuria and hematuria.  Musculoskeletal: Negative for back pain.  Skin: Positive for color change. Negative for wound.  Neurological: Negative for headaches.  Hematological: Does not bruise/bleed easily.    Allergies  Penicillins and Latex  Home Medications   Current Outpatient Rx  Name Route Sig Dispense Refill  . NEPHRO-VITE 0.8 MG PO TABS Oral Take 0.8 mg by mouth at bedtime.      . FENTANYL 25 MCG/HR TD PT72 Transdermal Place 1 patch onto the skin every other day.      Marland Kitchen GABAPENTIN 300 MG PO CAPS Oral Take 300 mg by mouth every 12 (twelve) hours.      Marland Kitchen HYPROMELLOSE 2.5 % OP SOLN Both Eyes Place 1 drop into both eyes 2 (two) times daily.      Marland Kitchen LAMIVUDINE 100 MG PO TABS Oral Take 0.5 tablets (50 mg total) by mouth daily. Take one half tablet daily 150 tablet 90  . LANTHANUM CARBONATE 500 MG PO CHEW Oral Chew 3,000 mg by mouth 3 (three) times daily with meals.     Marland Kitchen LIDOCAINE 5 %  EX PTCH Transdermal Place 1 patch onto the skin daily. Remove & Discard patch within 12 hours or as directed by MD     . PANTOPRAZOLE SODIUM 40 MG PO TBEC Oral Take 1 tablet (40 mg total) by mouth 2 (two) times daily before a meal.    . RALTEGRAVIR POTASSIUM 400 MG PO TABS Oral Take 400 mg by mouth 2 (two) times daily.      Marland Kitchen SILDENAFIL CITRATE 20 MG PO TABS Oral Take 20 mg by mouth 3 (three) times daily.      . WHEY PROTEIN ISOLATE POWD Oral Take 1 Package by mouth 2 (two) times daily. With vanilla pudding     . ZIDOVUDINE 100 MG PO CAPS Oral Take 100 mg by mouth 3 (three) times daily.      Marland Kitchen ALPRAZOLAM 0.5 MG PO TABS Oral Take 0.5 mg by mouth 2 (two) times daily as needed. anxiety    . EPOETIN ALFA 13086  UNIT/ML IJ SOLN Subcutaneous Inject 10,000 Units into the skin. To be given at dialysis     . ONDANSETRON HCL 4 MG PO TABS Oral Take 2 mg by mouth every 4 (four) hours as needed. nausea    . OXYCODONE HCL 5 MG PO CAPS Oral Take 5 mg by mouth every 6 (six) hours as needed. For pain      BP 141/83  Pulse 82  Temp(Src) 98.1 F (36.7 C) (Oral)  Resp 18  Ht 5\' 2"  (1.575 m)  Wt 140 lb (63.504 kg)  BMI 25.61 kg/m2  SpO2 96%  Physical Exam  Nursing note and vitals reviewed. Constitutional: She appears well-developed and well-nourished.  HENT:  Head: Normocephalic and atraumatic.  Mouth/Throat: Oropharynx is clear and moist.  Eyes: Conjunctivae and EOM are normal. Pupils are equal, round, and reactive to light.  Neck: Normal range of motion. Neck supple.       Right supraclavicular adenopathy tender measuring about the 2 x 3 cm in size.   Pulmonary/Chest: Effort normal and breath sounds normal. No respiratory distress. She has no rales.  Abdominal: Soft. Bowel sounds are normal. There is no tenderness.  Musculoskeletal: Normal range of motion. She exhibits no edema.       A-V shunt right arm  Lymphadenopathy:    She has cervical adenopathy.  Neurological: She is alert. No cranial nerve deficit. She exhibits normal muscle tone. Coordination normal.  Skin: Skin is warm. There is erythema.       Right breast with large area of cellulitis measuring probably 10-12 cm no distinct area of induration or fluctuance even though the some general induration to the entire area of erythema no nipple discharge.    ED Course  Procedures (including critical care time)   Date: 03/09/2011  Rate: 79  Rhythm: normal sinus rhythm and sinus arrhythmia  QRS Axis: normal  Intervals: normal  ST/T Wave abnormalities: nonspecific ST/T changes  Conduction Disutrbances:none  Narrative Interpretation:   Old EKG Reviewed: unchanged No significant change in EKG since 12/13/2010   Labs Reviewed  CBC -  Abnormal; Notable for the following:    RBC 3.22 (*)    Hemoglobin 10.6 (*)    HCT 32.6 (*)    MCV 101.2 (*)    Platelets 102 (*)    All other components within normal limits  BASIC METABOLIC PANEL - Abnormal; Notable for the following:    Sodium 131 (*)    Chloride 89 (*)    BUN 56 (*)  Creatinine, Ser 14.01 (*)    GFR calc non Af Amer 3 (*)    GFR calc Af Amer 3 (*)    All other components within normal limits  HEPATIC FUNCTION PANEL - Abnormal; Notable for the following:    Total Protein 10.6 (*)    Albumin 3.2 (*)    All other components within normal limits  PRO B NATRIURETIC PEPTIDE - Abnormal; Notable for the following:    Pro B Natriuretic peptide (BNP) 3673.0 (*)    All other components within normal limits  CULTURE, BLOOD (ROUTINE X 2)  CULTURE, BLOOD (ROUTINE X 2)   Dg Chest 2 View  03/09/2011  *RADIOLOGY REPORT*  Clinical Data: Chest pain.  History of CHF.  CHEST - 2 VIEW  Comparison: 12/13/2010 and 06/29/2008  Findings: Two views of the chest were obtained.  There is chronic enlargement of the hilar structures.  There is no evidence of focal airspace disease.  Heart and mediastinum are grossly stable. Atherosclerotic calcifications involving the thoracic aortic arch. Surgical clips in the right axilla.  High density material in the colon.  No evidence to suggest pleural effusions.  IMPRESSION: Stable chest radiograph findings without acute changes.  Chronic enlargement of the hilar structures.  Original Report Authenticated By: Markus Daft, M.D.     1. Cellulitis of breast       MDM   Patient resident of [facility] with right breast redness and swelling and pain for 2 days, patient with history of breast cellulitis in the past as per her problem list. Patient is a dialysis patient was due for dialysis today but did not have it done her potassium is fine dialysis would not be required at this point today. Blood cultures obtained IV  1 g of vancomycin given.   No palpable abscess in the right breast. Her AV shunt for dialysis is in the right arm as well. Discussed with hospitalist they will see and admit.         Mervin Kung, MD 03/09/11 [phone]

## 2011-03-09 NOTE — ED Notes (Signed)
MD at bedside. 

## 2011-03-09 NOTE — ED Notes (Signed)
Pt has pain, redness and swelling to Rt breast per [place] x 2 days.

## 2011-03-09 NOTE — H&P (Signed)
[name] MRN: [id number] DOB/AGE: 05-24-1950 61 y.o.  Admit date: 03/09/2011 Chief Complaint: Right breast swelling, erythema and pain HPI: This 61 year old lady, who has end-stage renal disease and is on dialysis 3 times a week, and who also has HIV disease, presents with the above symptoms for the last 2-3 days. She has not had a fever per se but the swelling of the wrist right breast in pain is intense. When she presented to the emergency room she clearly had a cellulitis in this area and we are now requested to admit her for inpatient intravenous antibiotics.  Past Medical History  Diagnosis Date  . Pulmonary HTN   . Renal disorder   . Hypertension   . Depression   . Insomnia   . Chronic diarrhea   . HIV (human immunodeficiency virus infection)    Past Surgical History  Procedure Date  . Dialysis shunts   . Esophagogastroduodenoscopy 12/15/2010    Procedure: ESOPHAGOGASTRODUODENOSCOPY (EGD);  Surgeon: Daneil Dolin, MD;  Location: [facility];  Service: Endoscopy;  Laterality: N/A;        Family History  Problem Relation Age of Onset  . Anesthesia problems Neg Hx   . Hypotension Neg Hx   . Malignant hyperthermia Neg Hx   . Pseudochol deficiency Neg Hx      Social history: She is widowed. She lives in a skilled nursing facility. She does not smoke. She does not drink alcohol.  Allergies:  Allergies  Allergen Reactions  . Penicillins     REACTION: rash  . Latex Rash    Medications Prior to Admission  Medication Dose Route Frequency Provider Last Rate Last Dose  . 0.9 %  sodium chloride infusion   Intravenous Continuous Mervin Kung, MD 100 mL/hr at 03/09/11 1613    . acetaminophen (TYLENOL) tablet 325 mg  325 mg Oral Q4H PRN Nimish C Gosrani      . albuterol (PROVENTIL HFA;VENTOLIN HFA) 108 (90 BASE) MCG/ACT inhaler 2 puff  2 puff Inhalation Q6H PRN Nimish C Gosrani      . ALPRAZolam (XANAX) tablet 0.5 mg  0.5 mg Oral BID PRN Nimish C Gosrani        . cholestyramine (QUESTRAN) packet 4 g  4 g Oral BID Nimish C Gosrani      . epoetin alfa (EPOGEN,PROCRIT) injection 10,000 Units  10,000 Units Subcutaneous Once Nimish C Gosrani      . fentaNYL (DURAGESIC - dosed mcg/hr) patch 25 mcg  25 mcg Transdermal Q48H Nimish C Gosrani      . Flora-Q (FLORA-Q) Capsule 1 capsule  1 capsule Oral Daily Nimish C Gosrani      . gabapentin (NEURONTIN) capsule 300 mg  300 mg Oral Q12H Nimish C Gosrani      . heparin injection 5,000 Units  5,000 Units Subcutaneous Q8H Nimish C Gosrani      . HYDROcodone-acetaminophen (NORCO) 5-325 MG per tablet 1 tablet  1 tablet Oral Q4H PRN Nimish C Gosrani      . hydroxypropyl methylcellulose (ISOPTO TEARS) 2.5 % ophthalmic solution 1 drop  1 drop Both Eyes BID Nimish C Gosrani      . lamivudine (EPIVIR) tablet 50 mg  50 mg Oral Daily Nimish C Gosrani      . lanthanum (FOSRENOL) chewable tablet 3,000 mg  3,000 mg Oral TID WC Nimish C Gosrani      . lidocaine (LIDODERM) 5 % 1 patch  1 patch Transdermal Q24H Nimish C Gosrani      .  multivitamin (RENA-VIT) tablet 1 tablet  1 tablet Oral Daily Nimish C Gosrani      . ondansetron (ZOFRAN) tablet 4 mg  4 mg Oral Q6H PRN Nimish C Gosrani       Or  . ondansetron (ZOFRAN) injection 4 mg  4 mg Intravenous Q6H PRN Nimish C Gosrani      . ondansetron (ZOFRAN) tablet 2 mg  2 mg Oral Q4H PRN Nimish C Gosrani      . pantoprazole (PROTONIX) EC tablet 40 mg  40 mg Oral BID AC Nimish C Gosrani      . paricalcitol (ZEMPLAR) injection 5 mcg  5 mcg Intravenous Once in dialysis Nimish C Gosrani      . raltegravir (ISENTRESS) tablet 400 mg  400 mg Oral BID Nimish C Gosrani      . sevelamer (RENVELA) tablet 800 mg  800 mg Oral TID WC Nimish C Gosrani      . sildenafil (REVATIO) tablet 20 mg  20 mg Oral TID Nimish C Gosrani      . vancomycin (VANCOCIN) IVPB 1000 mg/200 mL premix  1,000 mg Intravenous Once Mervin Kung, MD 200 mL/hr at 03/09/11 1614 1,000 mg at 03/09/11 1614  . zidovudine  (RETROVIR) capsule 100 mg  100 mg Oral TID Nimish C Gosrani       Medications Prior to Admission  Medication Sig Dispense Refill  . b complex-vitamin c-folic acid (NEPHRO-VITE) 0.8 MG TABS Take 0.8 mg by mouth at bedtime.        . fentaNYL (DURAGESIC - DOSED MCG/HR) 25 MCG/HR Place 1 patch onto the skin every other day.        . gabapentin (NEURONTIN) 300 MG capsule Take 300 mg by mouth every 12 (twelve) hours.        . hydroxypropyl methylcellulose (ISOPTO TEARS) 2.5 % ophthalmic solution Place 1 drop into both eyes 2 (two) times daily.        Marland Kitchen lamivudine (EPIVIR) 100 MG tablet Take 0.5 tablets (50 mg total) by mouth daily. Take one half tablet daily  150 tablet  90  . lanthanum (FOSRENOL) 500 MG chewable tablet Chew 3,000 mg by mouth 3 (three) times daily with meals.       . lidocaine (LIDODERM) 5 % Place 1 patch onto the skin daily. Remove & Discard patch within 12 hours or as directed by MD       . pantoprazole (PROTONIX) 40 MG tablet Take 1 tablet (40 mg total) by mouth 2 (two) times daily before a meal.      . raltegravir (ISENTRESS) 400 MG tablet Take 400 mg by mouth 2 (two) times daily.        . sildenafil (REVATIO) 20 MG tablet Take 20 mg by mouth 3 (three) times daily.        . zidovudine (RETROVIR) 100 MG capsule Take 100 mg by mouth 3 (three) times daily.        Marland Kitchen ALPRAZolam (XANAX) 0.5 MG tablet Take 0.5 mg by mouth 2 (two) times daily as needed. anxiety      . epoetin alfa (EPOGEN) 10000 UNIT/ML injection Inject 10,000 Units into the skin. To be given at dialysis       . ondansetron (ZOFRAN) 4 MG tablet Take 2 mg by mouth every 4 (four) hours as needed. nausea      . oxycodone (OXY-IR) 5 MG capsule Take 5 mg by mouth every 6 (six) hours as needed. For pain  [id number] from the symptoms mentioned above,there are no other symptoms referable to all systems reviewed.  Physical Exam: Blood pressure 141/83, pulse 82, temperature 98.1 F (36.7 C), temperature source Oral,  resp. rate 18, height 5\' 2"  (1.575 m), weight 63.504 kg (140 lb), SpO2 96.00%. She appears to be in pain at rest from her right breast cellulitis. However, she is not toxic or septic. Heart sounds are present and normal. Lung fields are clear. She clearly has extensive cellulitis in the right breast affecting all of the breast. She does not have any obvious right axillary lymphadenopathy. She appears to have right neck lymphadenopathy however. She is alert and orientated. There are no focal neurological signs.    Basename 03/09/11 1500  WBC 5.4  NEUTROABS --  HGB 10.6*  HCT 32.6*  MCV 101.2*  PLT 102*    Basename 03/09/11 1500  NA 131*  K 4.0  CL 89*  CO2 28  GLUCOSE 79  BUN 56*  CREATININE 14.01*  CALCIUM 10.4  MG --         Dg Chest 2 View  03/09/2011  *RADIOLOGY REPORT*  Clinical Data: Chest pain.  History of CHF.  CHEST - 2 VIEW  Comparison: 12/13/2010 and 06/29/2008  Findings: Two views of the chest were obtained.  There is chronic enlargement of the hilar structures.  There is no evidence of focal airspace disease.  Heart and mediastinum are grossly stable. Atherosclerotic calcifications involving the thoracic aortic arch. Surgical clips in the right axilla.  High density material in the colon.  No evidence to suggest pleural effusions.  IMPRESSION: Stable chest radiograph findings without acute changes.  Chronic enlargement of the hilar structures.  Original Report Authenticated By: Markus Daft, M.D.   Impression: 1. Right breast cellulitis, extensive. 2. End-stage renal disease on hemodialysis 3 times a week. 3. HIV disease. 4. Hypertension.     Plan: 1. Admit to medical floor. 2. Intravenous vancomycin. 3. Nephrology consultation for hemodialysis. Further recommendations will depend on patient's hospital progress.      Doree Albee Pager [phone]  03/09/2011, 5:10 PM

## 2011-03-10 ENCOUNTER — Inpatient Hospital Stay (HOSPITAL_COMMUNITY): Payer: Medicare Other

## 2011-03-10 DIAGNOSIS — N61 Mastitis without abscess: Secondary | ICD-10-CM | POA: Diagnosis not present

## 2011-03-10 DIAGNOSIS — B20 Human immunodeficiency virus [HIV] disease: Secondary | ICD-10-CM | POA: Diagnosis not present

## 2011-03-10 DIAGNOSIS — N186 End stage renal disease: Secondary | ICD-10-CM | POA: Diagnosis not present

## 2011-03-10 DIAGNOSIS — E8809 Other disorders of plasma-protein metabolism, not elsewhere classified: Secondary | ICD-10-CM | POA: Diagnosis not present

## 2011-03-10 DIAGNOSIS — I10 Essential (primary) hypertension: Secondary | ICD-10-CM | POA: Diagnosis not present

## 2011-03-10 LAB — COMPREHENSIVE METABOLIC PANEL
ALT: 11 U/L (ref 0–35)
Alkaline Phosphatase: 50 U/L (ref 39–117)
BUN: 58 mg/dL — ABNORMAL HIGH (ref 6–23)
CO2: 25 mEq/L (ref 19–32)
GFR calc Af Amer: 3 mL/min — ABNORMAL LOW (ref >90)
GFR calc non Af Amer: 2 mL/min — ABNORMAL LOW (ref >90)
Glucose, Bld: 70 mg/dL (ref 70–99)
Potassium: 4.1 mEq/L (ref 3.5–5.1)
Sodium: 133 mEq/L — ABNORMAL LOW (ref 135–145)
Total Bilirubin: 0.5 mg/dL (ref 0.3–1.2)

## 2011-03-10 LAB — CBC
MCHC: 33.1 g/dL (ref 30.0–36.0)
Platelets: 92 10*3/uL — ABNORMAL LOW (ref 150–400)
RDW: 14.3 % (ref 11.5–15.5)

## 2011-03-10 LAB — MRSA PCR SCREENING: MRSA by PCR: NEGATIVE

## 2011-03-10 MED ORDER — HEPARIN SODIUM (PORCINE) 1000 UNIT/ML DIALYSIS
20.0000 [IU]/kg | INTRAMUSCULAR | Status: DC | PRN
  Filled 2011-03-10: qty 2

## 2011-03-10 MED ORDER — HEPARIN SODIUM (PORCINE) 1000 UNIT/ML DIALYSIS
500.0000 [IU] | INTRAMUSCULAR | Status: DC | PRN
  Filled 2011-03-10: qty 1

## 2011-03-10 MED ORDER — VANCOMYCIN HCL IN DEXTROSE 1-5 GM/200ML-% IV SOLN
1000.0000 mg | INTRAVENOUS | Status: DC
  Administered 2011-03-10: 1000 mg via INTRAVENOUS
  Filled 2011-03-10 (×2): qty 200

## 2011-03-10 MED ORDER — EPOETIN ALFA 10000 UNIT/ML IJ SOLN
10000.0000 [IU] | INTRAMUSCULAR | Status: DC
  Filled 2011-03-10: qty 1

## 2011-03-10 MED ORDER — HEPARIN SODIUM (PORCINE) 1000 UNIT/ML DIALYSIS
20.0000 [IU]/kg | INTRAMUSCULAR | Status: DC | PRN
  Administered 2011-03-10: 1200 [IU] via INTRAVENOUS_CENTRAL
  Filled 2011-03-10: qty 2

## 2011-03-10 NOTE — Consult Note (Signed)
Reason for Consult: End-stage renal disease Referring Physician: Dr. Mila Merry is an 61 y.o. female.  HPI: Patient wheeze her history of her end-stage renal disease on maintenance hemodialysis, hypertension, HIV-positive presently her was brought to the emergency room because of her increase her swelling of her her breast. Patient had some pain over her breasts about a week ago and at that time she has a slight tenderness, no erythema and questionable mass because of that an arrangement was made for her to have further workup. However patient didn't follow the appointment. Presently  she came to the hospital because the swelling becomes more prominent and also the pain become worse. Patient denies any fever chills or sweating. She denies also any difficulty increasing.  Past Medical History  Diagnosis Date  . Pulmonary HTN   . Renal disorder   . Hypertension   . Depression   . Insomnia   . Chronic diarrhea   . HIV (human immunodeficiency virus infection)     Past Surgical History  Procedure Date  . Dialysis shunts   . Esophagogastroduodenoscopy 12/15/2010    Procedure: ESOPHAGOGASTRODUODENOSCOPY (EGD);  Surgeon: Daneil Dolin, MD;  Location: [facility];  Service: Endoscopy;  Laterality: N/A;    Family History  Problem Relation Age of Onset  . Anesthesia problems Neg Hx   . Hypotension Neg Hx   . Malignant hyperthermia Neg Hx   . Pseudochol deficiency Neg Hx     Social History:  reports that she has never smoked. She has never used smokeless tobacco. She reports that she does not drink alcohol or use illicit drugs.  Allergies:  Allergies  Allergen Reactions  . Penicillins     REACTION: rash  . Latex Rash    Medications: I have reviewed the patient's current medications.  Results for orders placed during the hospital encounter of 03/09/11 (from the past 48 hour(s))  CBC     Status: Abnormal   Collection Time   03/09/11  3:00 PM      Component Value  Range Comment   WBC 5.4  4.0 - 10.5 (K/uL)    RBC 3.22 (*) 3.87 - 5.11 (MIL/uL)    Hemoglobin 10.6 (*) 12.0 - 15.0 (g/dL)    HCT 32.6 (*) 36.0 - 46.0 (%)    MCV 101.2 (*) 78.0 - 100.0 (fL)    MCH 32.9  26.0 - 34.0 (pg)    MCHC 32.5  30.0 - 36.0 (g/dL)    RDW 14.2  11.5 - 15.5 (%)    Platelets 102 (*) 150 - 400 (K/uL)   BASIC METABOLIC PANEL     Status: Abnormal   Collection Time   03/09/11  3:00 PM      Component Value Range Comment   Sodium 131 (*) 135 - 145 (mEq/L)    Potassium 4.0  3.5 - 5.1 (mEq/L)    Chloride 89 (*) 96 - 112 (mEq/L)    CO2 28  19 - 32 (mEq/L)    Glucose, Bld 79  70 - 99 (mg/dL)    BUN 56 (*) 6 - 23 (mg/dL)    Creatinine, Ser 14.01 (*) 0.50 - 1.10 (mg/dL)    Calcium 10.4  8.4 - 10.5 (mg/dL)    GFR calc non Af Amer 3 (*) >90 (mL/min)    GFR calc Af Amer 3 (*) >90 (mL/min)   HEPATIC FUNCTION PANEL     Status: Abnormal   Collection Time   03/09/11  3:00 PM  Component Value Range Comment   Total Protein 10.6 (*) 6.0 - 8.3 (g/dL)    Albumin 3.2 (*) 3.5 - 5.2 (g/dL)    AST 18  0 - 37 (U/L)    ALT 14  0 - 35 (U/L)    Alkaline Phosphatase 60  39 - 117 (U/L)    Total Bilirubin 0.6  0.3 - 1.2 (mg/dL)    Bilirubin, Direct 0.2  0.0 - 0.3 (mg/dL)    Indirect Bilirubin 0.4  0.3 - 0.9 (mg/dL)   CULTURE, BLOOD (ROUTINE X 2)     Status: Normal (Preliminary result)   Collection Time   03/09/11  3:00 PM      Component Value Range Comment   Specimen Description BLOOD RIGHT ANTECUBITAL DRAWN BY RN      Special Requests BOTTLES DRAWN AEROBIC AND ANAEROBIC 8CC      Culture NO GROWTH 1 DAY      Report Status PENDING     PRO B NATRIURETIC PEPTIDE     Status: Abnormal   Collection Time   03/09/11  3:03 PM      Component Value Range Comment   Pro B Natriuretic peptide (BNP) 3673.0 (*) 0 - 125 (pg/mL)   CULTURE, BLOOD (ROUTINE X 2)     Status: Normal (Preliminary result)   Collection Time   03/09/11  3:07 PM      Component Value Range Comment   Specimen Description BLOOD RIGHT  FOREARM      Special Requests BOTTLES DRAWN AEROBIC AND ANAEROBIC 6CC      Culture NO GROWTH 1 DAY      Report Status PENDING     MRSA PCR SCREENING     Status: Normal   Collection Time   03/10/11 12:20 AM      Component Value Range Comment   MRSA by PCR NEGATIVE  NEGATIVE    COMPREHENSIVE METABOLIC PANEL     Status: Abnormal   Collection Time   03/10/11  5:16 AM      Component Value Range Comment   Sodium 133 (*) 135 - 145 (mEq/L)    Potassium 4.1  3.5 - 5.1 (mEq/L)    Chloride 94 (*) 96 - 112 (mEq/L)    CO2 25  19 - 32 (mEq/L)    Glucose, Bld 70  70 - 99 (mg/dL)    BUN 58 (*) 6 - 23 (mg/dL)    Creatinine, Ser 14.92 (*) 0.50 - 1.10 (mg/dL)    Calcium 9.5  8.4 - 10.5 (mg/dL)    Total Protein 8.9 (*) 6.0 - 8.3 (g/dL)    Albumin 2.6 (*) 3.5 - 5.2 (g/dL)    AST 14  0 - 37 (U/L)    ALT 11  0 - 35 (U/L)    Alkaline Phosphatase 50  39 - 117 (U/L)    Total Bilirubin 0.5  0.3 - 1.2 (mg/dL)    GFR calc non Af Amer 2 (*) >90 (mL/min)    GFR calc Af Amer 3 (*) >90 (mL/min)   CBC     Status: Abnormal   Collection Time   03/10/11  5:16 AM      Component Value Range Comment   WBC 2.9 (*) 4.0 - 10.5 (K/uL)    RBC 2.81 (*) 3.87 - 5.11 (MIL/uL)    Hemoglobin 9.4 (*) 12.0 - 15.0 (g/dL)    HCT 28.4 (*) 36.0 - 46.0 (%)    MCV 101.1 (*) 78.0 - 100.0 (fL)    MCH  33.5  26.0 - 34.0 (pg)    MCHC 33.1  30.0 - 36.0 (g/dL)    RDW 14.3  11.5 - 15.5 (%)    Platelets 92 (*) 150 - 400 (K/uL)     Dg Chest 2 View  03/09/2011  *RADIOLOGY REPORT*  Clinical Data: Chest pain.  History of CHF.  CHEST - 2 VIEW  Comparison: 12/13/2010 and 06/29/2008  Findings: Two views of the chest were obtained.  There is chronic enlargement of the hilar structures.  There is no evidence of focal airspace disease.  Heart and mediastinum are grossly stable. Atherosclerotic calcifications involving the thoracic aortic arch. Surgical clips in the right axilla.  High density material in the colon.  No evidence to suggest pleural  effusions.  IMPRESSION: Stable chest radiograph findings without acute changes.  Chronic enlargement of the hilar structures.  Original Report Authenticated By: Markus Daft, M.D.    Review of Systems  Respiratory: Positive for cough. Negative for sputum production and shortness of breath.   Cardiovascular: Negative for chest pain and palpitations.  Gastrointestinal: Positive for diarrhea. Negative for nausea and vomiting.  Neurological: Positive for weakness.   Blood pressure 152/88, pulse 83, temperature 97.4 F (36.3 C), temperature source Oral, resp. rate 20, height 5\' 2"  (1.575 m), weight 61.19 kg (134 lb 14.4 oz), SpO2 98.00%. Physical Exam  Constitutional: She is oriented to person, place, and time.  Eyes: Left eye exhibits no discharge.  Neck: JVD present.  Cardiovascular: Normal rate, regular rhythm and normal heart sounds.   No murmur heard. Respiratory: No respiratory distress. She has wheezes. She has rales.       Patient with swollen right breast,edema and erythema  GI: She exhibits distension. There is no tenderness.  Musculoskeletal: She exhibits no edema.  Neurological: She is alert and oriented to person, place, and time.    Assessment/Plan: Problem #1 end-stage renal disease she status post hemodialysis on Friday BUN and creatinine this moment is stable normal potassium. 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 Problem #7 history of pulmonary hypertension Problem #8 history of her pancreatitis Recommendation: We'll make arrangements for patient to get dialysis today We'll try to remove 2-3 L We'll check her CBC, basic metabolic panel and phosphorus in the morning.  Neiva Maenza S 03/10/2011, 11:50 AM

## 2011-03-10 NOTE — Progress Notes (Signed)
ANTIBIOTIC CONSULT NOTE -   Pharmacy Consult for Vancomycin Indication:  cellulitis  Allergies  Allergen Reactions  . Penicillins     REACTION: rash  . Latex Rash    Patient Measurements: Height: 5\' 2"  (157.5 cm) Weight: 134 lb 14.4 oz (61.19 kg) IBW/kg (Calculated) : 50.1  Adjusted Body Weight:n/a  Vital Signs: Temp: 97.4 F (36.3 C) (01/08 0620) BP: 152/88 mmHg (01/08 0620) Pulse Rate: 83  (01/08 0620) Intake/Output from previous day: 01/07 0701 - 01/08 0700 In: 868.3 [P.O.:210; I.V.:658.3] Out: -  Intake/Output from this shift: Total I/O In: 360 [P.O.:360] Out: -   Labs:  Basename 03/10/11 0516 03/09/11 1500  WBC 2.9* 5.4  HGB 9.4* 10.6*  PLT 92* 102*  LABCREA -- --  CREATININE 14.92* 14.01*   Estimated Creatinine Clearance: 3.5 ml/min (by C-G formula based on Cr of 14.92).    Microbiology: Recent Results (from the past 720 hour(s))  CULTURE, BLOOD (ROUTINE X 2)     Status: Normal (Preliminary result)   Collection Time   03/09/11  3:00 PM      Component Value Range Status Comment   Specimen Description BLOOD RIGHT ANTECUBITAL DRAWN BY RN   Final    Special Requests BOTTLES DRAWN AEROBIC AND ANAEROBIC 8CC   Final    Culture NO GROWTH 1 DAY   Final    Report Status PENDING   Incomplete   CULTURE, BLOOD (ROUTINE X 2)     Status: Normal (Preliminary result)   Collection Time   03/09/11  3:07 PM      Component Value Range Status Comment   Specimen Description BLOOD RIGHT FOREARM   Final    Special Requests BOTTLES DRAWN AEROBIC AND ANAEROBIC 6CC   Final    Culture NO GROWTH 1 DAY   Final    Report Status PENDING   Incomplete   MRSA PCR SCREENING     Status: Normal   Collection Time   03/10/11 12:20 AM      Component Value Range Status Comment   MRSA by PCR NEGATIVE  NEGATIVE  Final     Medical History: Past Medical History  Diagnosis Date  . Pulmonary HTN   . Renal disorder   . Hypertension   . Depression   . Insomnia   . Chronic diarrhea   . HIV  (human immunodeficiency virus infection)     Medications:  Scheduled:     . cholestyramine  4 g Oral Custom  . epoetin alfa  10,000 Units Subcutaneous Once  . fentaNYL  25 mcg Transdermal Q48H  . Flora-Q  1 capsule Oral Daily  . gabapentin  300 mg Oral Q12H  . heparin  5,000 Units Subcutaneous Q8H  . lamiVUDine  50 mg Oral Daily  . lanthanum  3,000 mg Oral TID WC  . lidocaine  1 patch Transdermal Q24H  . multivitamin  1 tablet Oral Daily  . pantoprazole  40 mg Oral BID AC  . paricalcitol  5 mcg Intravenous Once in dialysis  . polyvinyl alcohol  1 drop Both Eyes BID  . raltegravir  400 mg Oral BID  . sevelamer  800 mg Oral TID WC  . sildenafil  20 mg Oral TID  . vancomycin  1,000 mg Intravenous Once  . zidovudine  100 mg Oral TID  . DISCONTD: cholestyramine  4 g Oral BID  . DISCONTD: hydroxypropyl methylcellulose  1 drop Both Eyes BID  . DISCONTD: lamivudine  50 mg Oral Daily  . DISCONTD:  vancomycin  1,000 mg Intravenous Once in dialysis   Assessment: Empiric therapy for cellulitis.  Goal of Therapy:  Vancomycin trough level 15-20 mcg/ml  Plan:  HD planned for today. Vancomycin 1 gram IV after each HD.  Jetta Lout J 03/10/2011,12:14 PM

## 2011-03-10 NOTE — Progress Notes (Addendum)
Subjective: This lady feels somewhat better. She is slightly sleepy as she has not had adequate sleep overnight. She was seen by Dr Hinda Lenis who will arrange for dialysis today.           Physical Exam: Blood pressure 152/88, pulse 83, temperature 97.4 F (36.3 C), temperature source Oral, resp. rate 20, height 5\' 2"  (1.575 m), weight 61.19 kg (134 lb 14.4 oz), SpO2 98.00%. She looks systemically well. Heart sounds are present and normal. Lung fields are clear. Her right breast cellulitis clearly has improved even from last night.   Investigations:  Recent Results (from the past 240 hour(s))  CULTURE, BLOOD (ROUTINE X 2)     Status: Normal (Preliminary result)   Collection Time   03/09/11  3:00 PM      Component Value Range Status Comment   Specimen Description BLOOD RIGHT ANTECUBITAL DRAWN BY RN   Final    Special Requests BOTTLES DRAWN AEROBIC AND ANAEROBIC 8CC   Final    Culture NO GROWTH 1 DAY   Final    Report Status PENDING   Incomplete   CULTURE, BLOOD (ROUTINE X 2)     Status: Normal (Preliminary result)   Collection Time   03/09/11  3:07 PM      Component Value Range Status Comment   Specimen Description BLOOD RIGHT FOREARM   Final    Special Requests BOTTLES DRAWN AEROBIC AND ANAEROBIC 6CC   Final    Culture NO GROWTH 1 DAY   Final    Report Status PENDING   Incomplete   MRSA PCR SCREENING     Status: Normal   Collection Time   03/10/11 12:20 AM      Component Value Range Status Comment   MRSA by PCR NEGATIVE  NEGATIVE  Final      Basic Metabolic Panel:  Basename 03/10/11 0516 03/09/11 1500  NA 133* 131*  K 4.1 4.0  CL 94* 89*  CO2 25 28  GLUCOSE 70 79  BUN 58* 56*  CREATININE 14.92* 14.01*  CALCIUM 9.5 10.4  MG -- --  PHOS -- --   Liver Function Tests:  Basename 03/10/11 0516 03/09/11 1500  AST 14 18  ALT 11 14  ALKPHOS 50 60  BILITOT 0.5 0.6  PROT 8.9* 10.6*  ALBUMIN 2.6* 3.2*     CBC:  Basename 03/10/11 0516 03/09/11 1500  WBC 2.9* 5.4    NEUTROABS -- --  HGB 9.4* 10.6*  HCT 28.4* 32.6*  MCV 101.1* 101.2*  PLT 92* 102*    Dg Chest 2 View  03/09/2011  *RADIOLOGY REPORT*  Clinical Data: Chest pain.  History of CHF.  CHEST - 2 VIEW  Comparison: 12/13/2010 and 06/29/2008  Findings: Two views of the chest were obtained.  There is chronic enlargement of the hilar structures.  There is no evidence of focal airspace disease.  Heart and mediastinum are grossly stable. Atherosclerotic calcifications involving the thoracic aortic arch. Surgical clips in the right axilla.  High density material in the colon.  No evidence to suggest pleural effusions.  IMPRESSION: Stable chest radiograph findings without acute changes.  Chronic enlargement of the hilar structures.  Original Report Authenticated By: Markus Daft, M.D.      Medications: I have reviewed the patient's current medications.  Impression: 1. Right breast cellulitis, difficult to assess for masses because of severe tenderness. 2. HIV disease. 3. Hypertension. 4. End-stage renal disease on hemodialysis. 5. Decreasing white blood cell count, slightly concerning. Principal Problem:  *  Cellulitis of breast Active Problems:  HUMAN IMMUNODEFICIENCY VIRUS [HIV]  HYPERTENSION  End stage renal disease     Plan: 1. Continue intravenous vancomycin 2. Reduce IV fluids slightly. 3. Hemodialysis today per Dr Hinda Lenis. 4. Consider ultrasound of right breast to ascertain for abscess/mass. 5. Monitor white blood cell count closely.     LOS: 1 day   Doree Albee Pager [phone]  03/10/2011, 11:22 AM

## 2011-03-11 ENCOUNTER — Inpatient Hospital Stay (HOSPITAL_COMMUNITY): Payer: Medicare Other

## 2011-03-11 DIAGNOSIS — N2581 Secondary hyperparathyroidism of renal origin: Secondary | ICD-10-CM | POA: Diagnosis not present

## 2011-03-11 DIAGNOSIS — N6459 Other signs and symptoms in breast: Secondary | ICD-10-CM | POA: Diagnosis not present

## 2011-03-11 DIAGNOSIS — B20 Human immunodeficiency virus [HIV] disease: Secondary | ICD-10-CM | POA: Diagnosis not present

## 2011-03-11 DIAGNOSIS — I10 Essential (primary) hypertension: Secondary | ICD-10-CM | POA: Diagnosis not present

## 2011-03-11 DIAGNOSIS — D631 Anemia in chronic kidney disease: Secondary | ICD-10-CM | POA: Diagnosis not present

## 2011-03-11 DIAGNOSIS — N186 End stage renal disease: Secondary | ICD-10-CM | POA: Diagnosis not present

## 2011-03-11 DIAGNOSIS — N6489 Other specified disorders of breast: Secondary | ICD-10-CM | POA: Diagnosis not present

## 2011-03-11 DIAGNOSIS — N61 Mastitis without abscess: Secondary | ICD-10-CM | POA: Diagnosis not present

## 2011-03-11 LAB — CBC
HCT: 32.7 % — ABNORMAL LOW (ref 36.0–46.0)
MCHC: 31.2 g/dL (ref 30.0–36.0)
Platelets: 89 10*3/uL — ABNORMAL LOW (ref 150–400)
RDW: 14.2 % (ref 11.5–15.5)

## 2011-03-11 LAB — PHOSPHORUS: Phosphorus: 6.2 mg/dL — ABNORMAL HIGH (ref 2.3–4.6)

## 2011-03-11 LAB — BASIC METABOLIC PANEL
BUN: 26 mg/dL — ABNORMAL HIGH (ref 6–23)
GFR calc Af Amer: 5 mL/min — ABNORMAL LOW (ref >90)
GFR calc non Af Amer: 5 mL/min — ABNORMAL LOW (ref >90)
Potassium: 4.8 mEq/L (ref 3.5–5.1)
Sodium: 136 mEq/L (ref 135–145)

## 2011-03-11 IMAGING — MG MM DIGITAL DIAGNOSTIC BILAT CAD
4 series · 4 of 4 positions shown · non-contrast
Comparison: [DATE].

CLINICAL DATA: Erythema, tenderness, and skin thickening involving
the right breast. The patient had been previously asked to return
for a short-term follow-up evaluation in [DATE] but failed
return for a follow-up evaluation.

DIGITAL DIAGNOSTIC BILATERAL MAMMOGRAM WITH CAD AND RIGHT BREAST
ULTRASOUND:

[L CC]
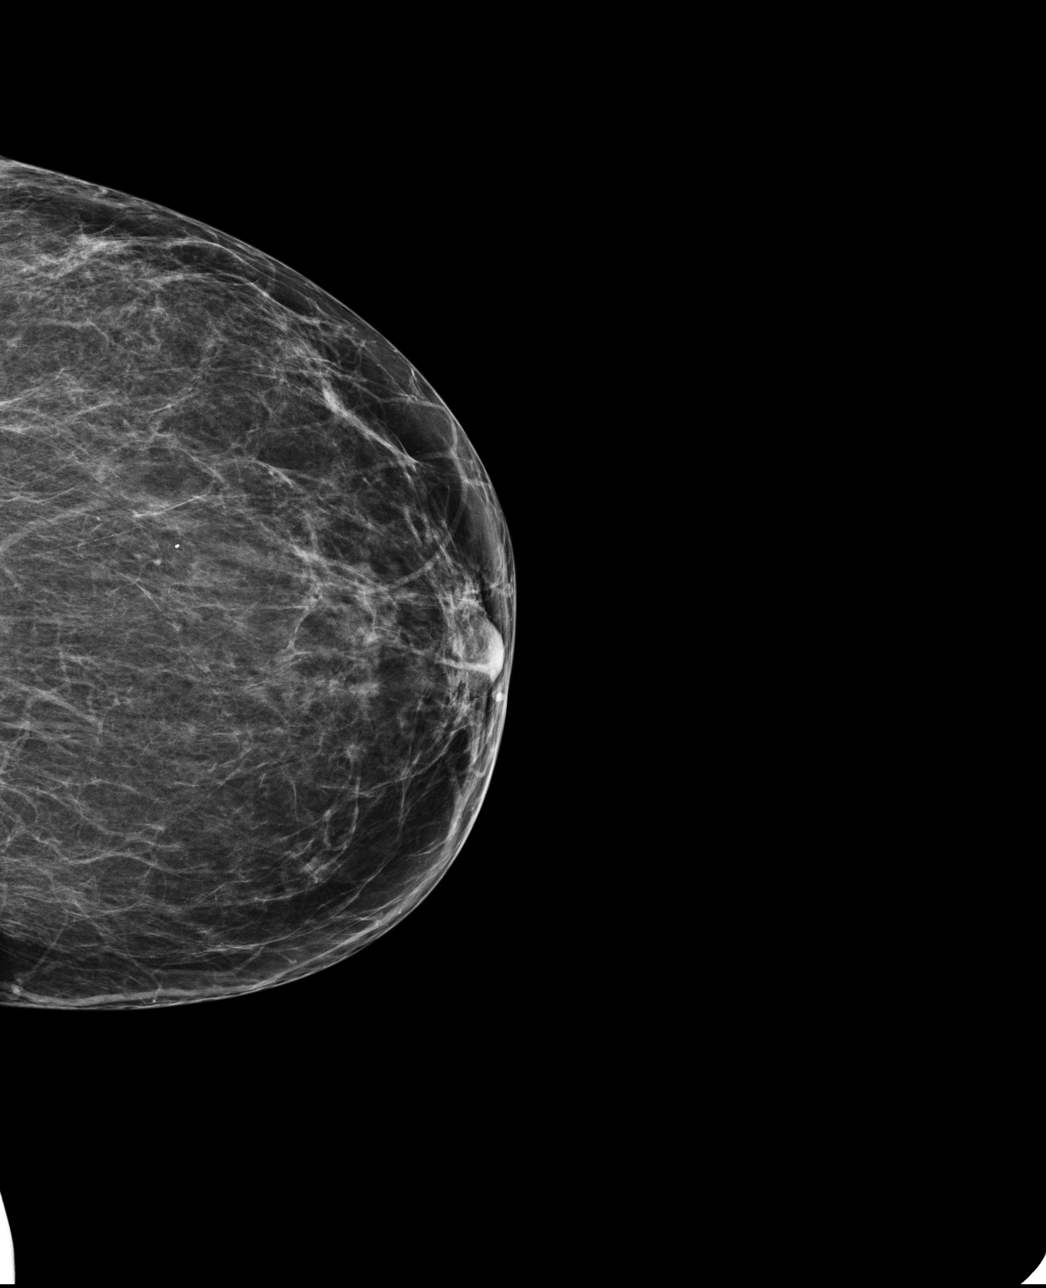

[L MLO]
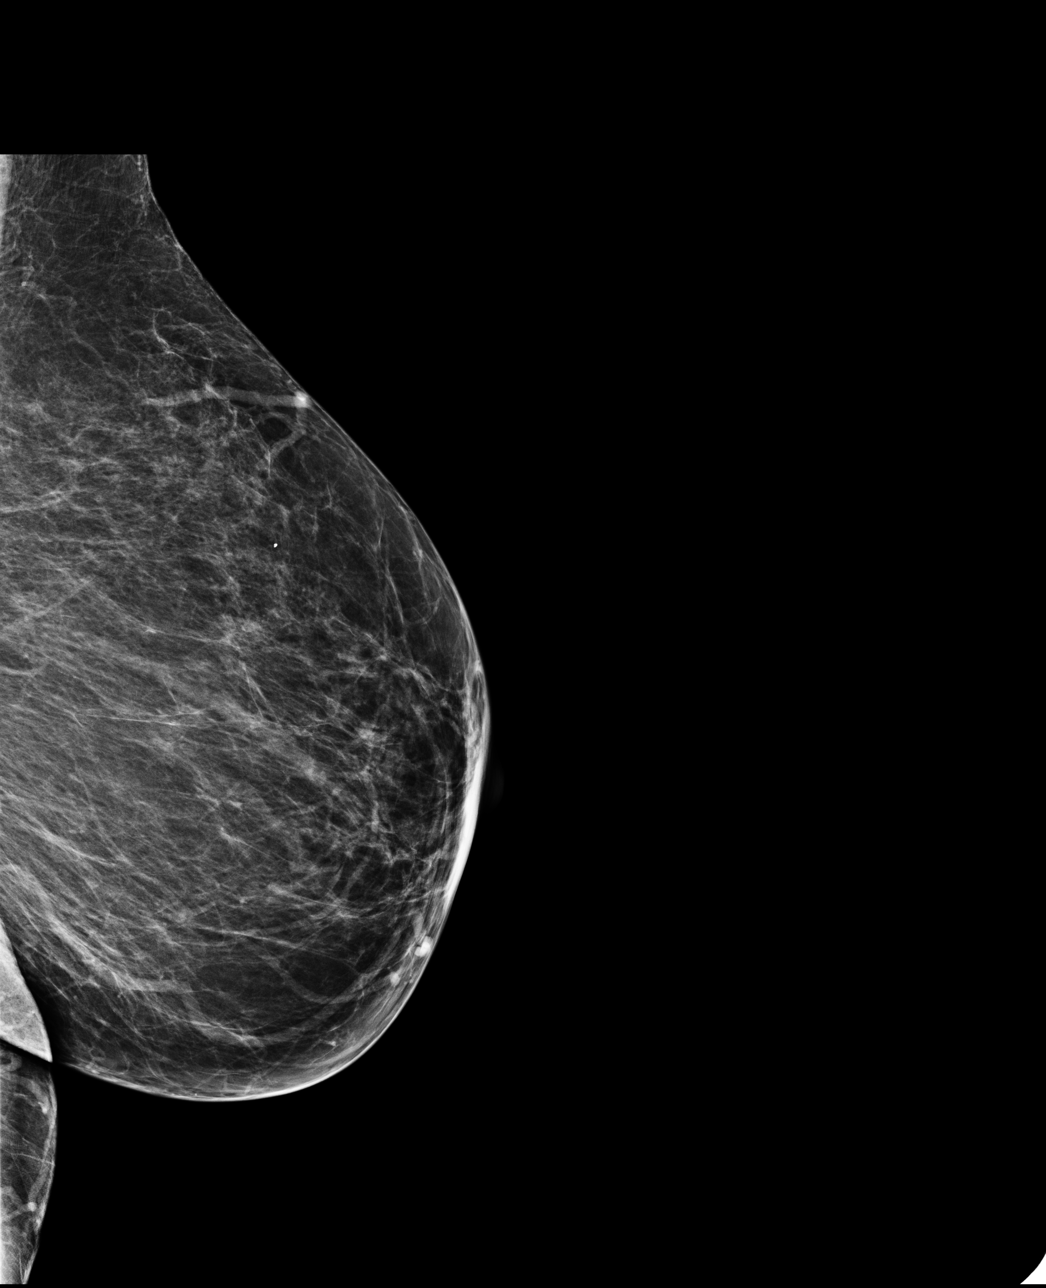

[R CC]
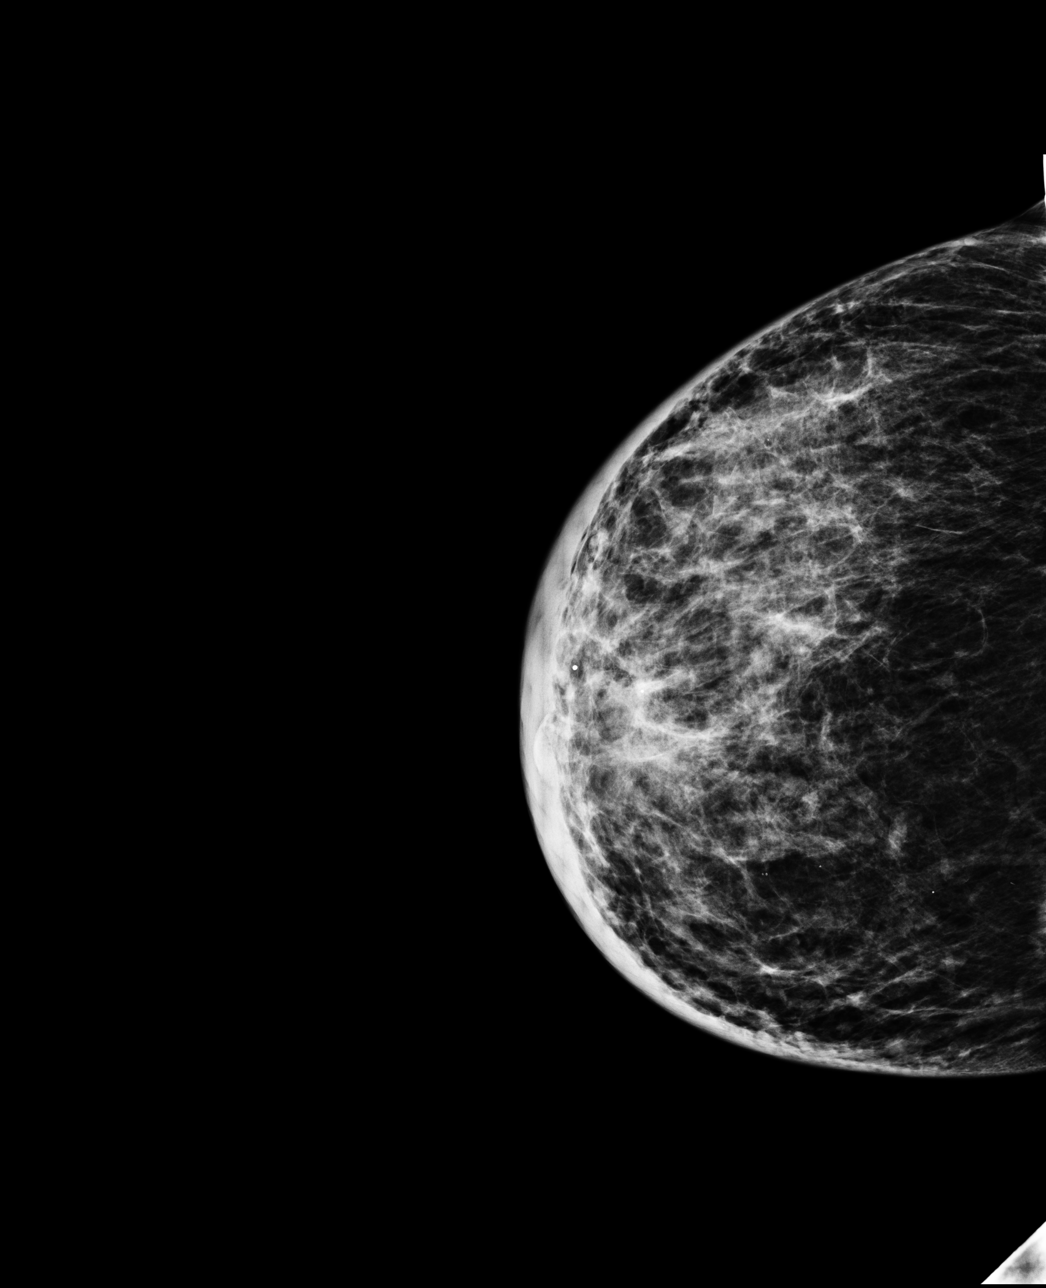

[R MLO]
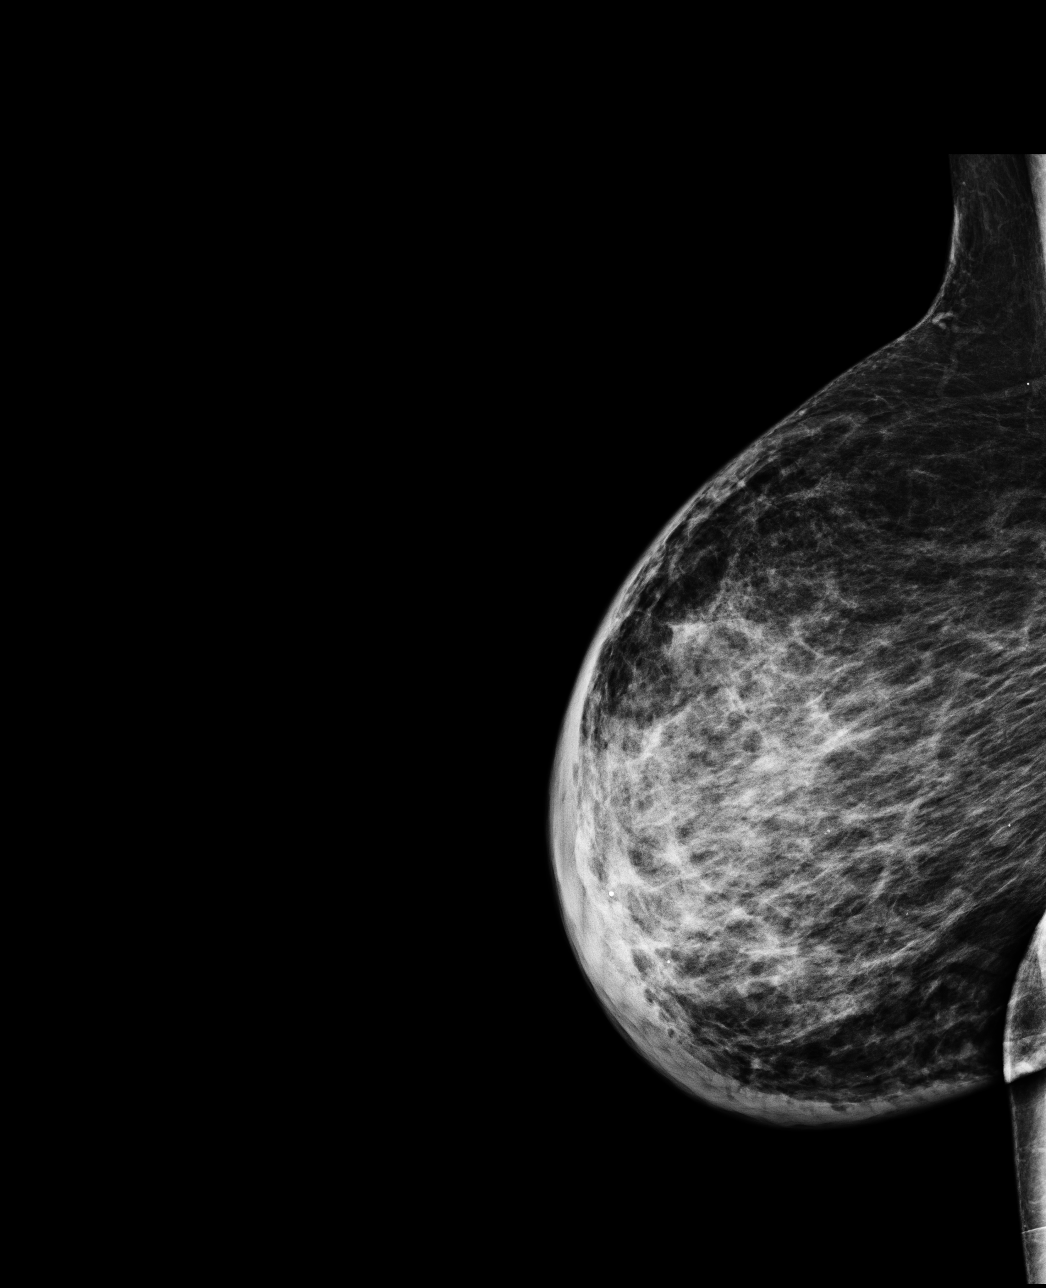

[4 of 4 positions shown; findings below may reference images not displayed]

FINDINGS: There is a scattered fibroglandular pattern.  There is
marked skin thickening throughout the right breast and there is
accentuation of the trabecular pattern within the right breast.
There is no discrete mass.  There are no worrisome
microcalcifications.  The left breast has a normal appearance and
is unchanged.
Mammographic images were processed with CAD.

On physical exam, there is diffuse skin thickening with erythema
and peau d'[REDACTED] throughout the right breast.  There is no
discrete palpable mass present at this time.

Ultrasound is performed, showing marked diffuse skin thickening
within the right breast with the skin thickness measuring up to 8
mm in diameter.  There is also edematous changes seen within the
soft tissues of the right breast.  There is no evidence for
abscess.  There is no mass or distortion.
IMPRESSION: Increased breast density and marked diffuse skin thickening.  This
is worrisome for possible inflammatory breast carcinoma versus
recurrent mastitis.  No evidence for a breast abscess.  Recommend
skin punch biopsy to exclude inflammatory carcinoma.

BI-RADS CATEGORY 4:  Suspicious abnormality - biopsy should be
considered.

## 2011-03-11 IMAGING — US US BREAST*R*
1 series · 6 of 6 positions shown · non-contrast
Comparison: [DATE].

CLINICAL DATA: Erythema, tenderness, and skin thickening involving
the right breast. The patient had been previously asked to return
for a short-term follow-up evaluation in [DATE] but failed
return for a follow-up evaluation.

DIGITAL DIAGNOSTIC BILATERAL MAMMOGRAM WITH CAD AND RIGHT BREAST
ULTRASOUND:

[Series 1: us breast*right* · 0.08mm/px · 6 of 6 slices shown]
[im 1/6]
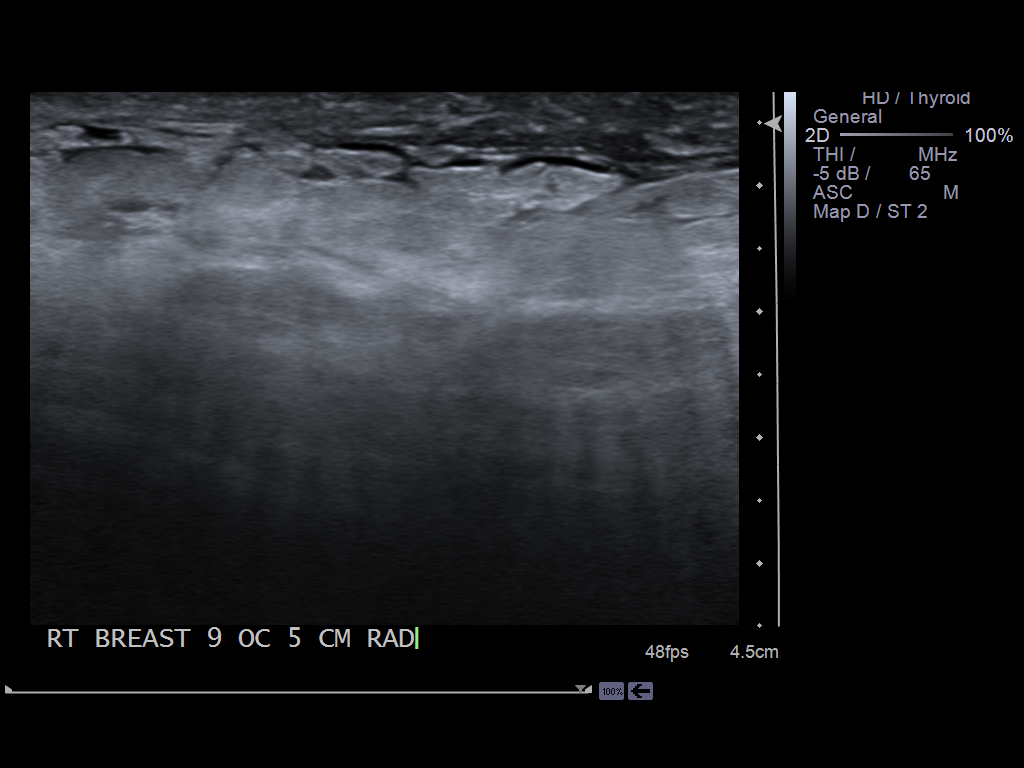
[im 2/6]
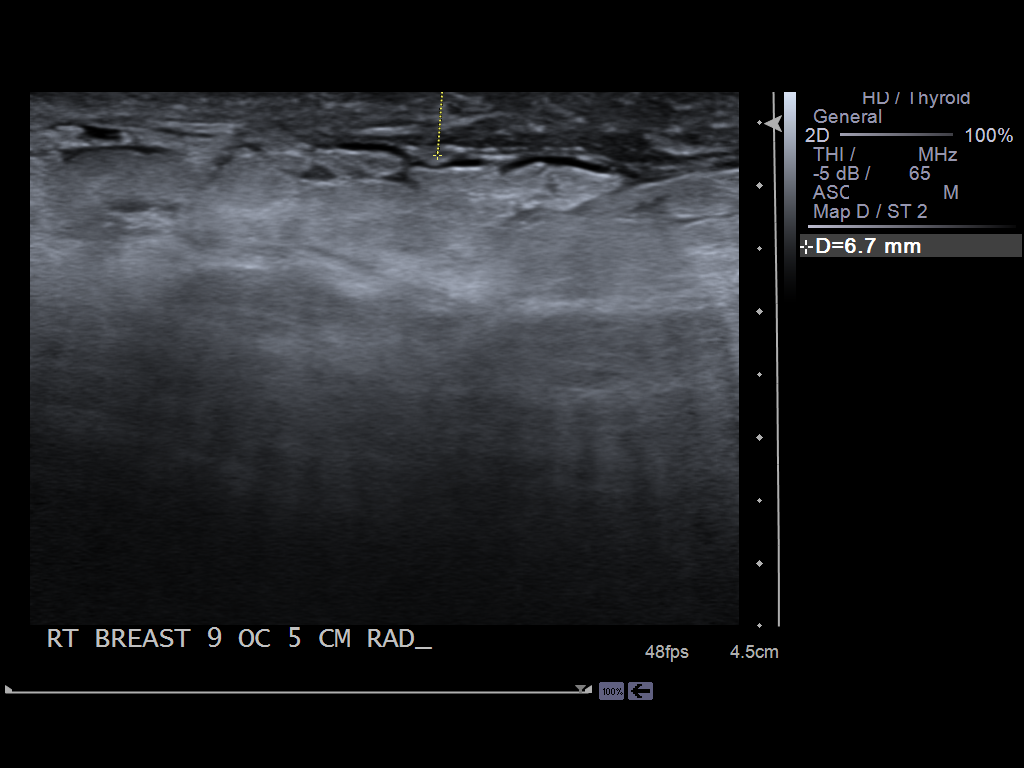
[im 3/6]
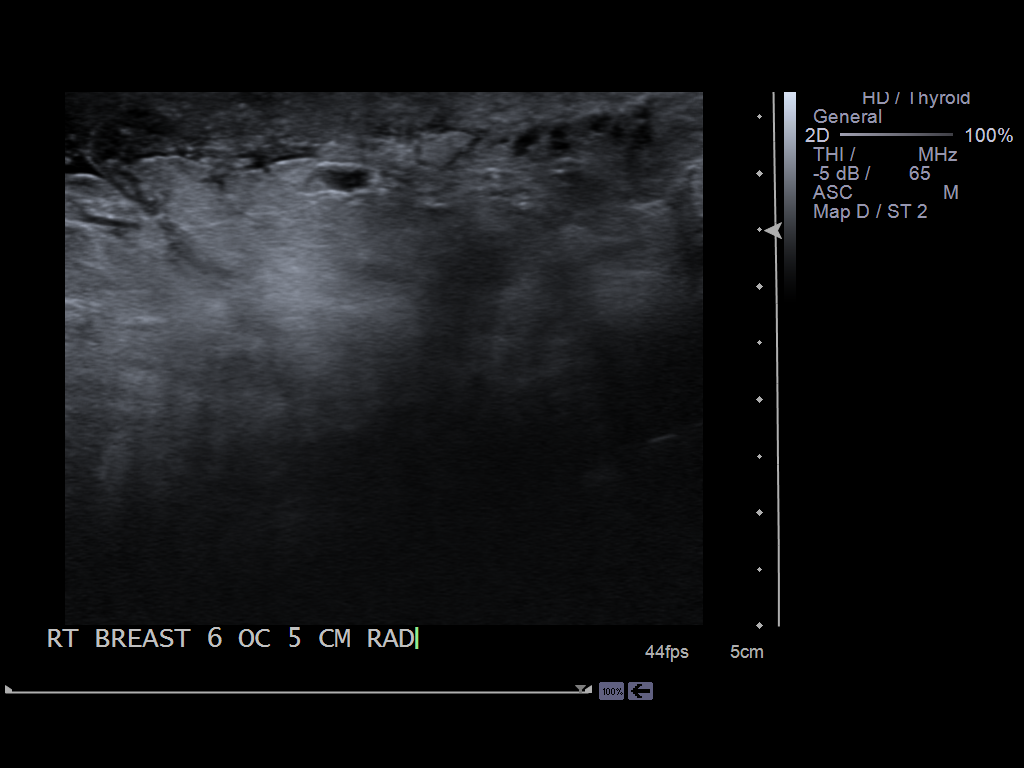
[im 4/6]
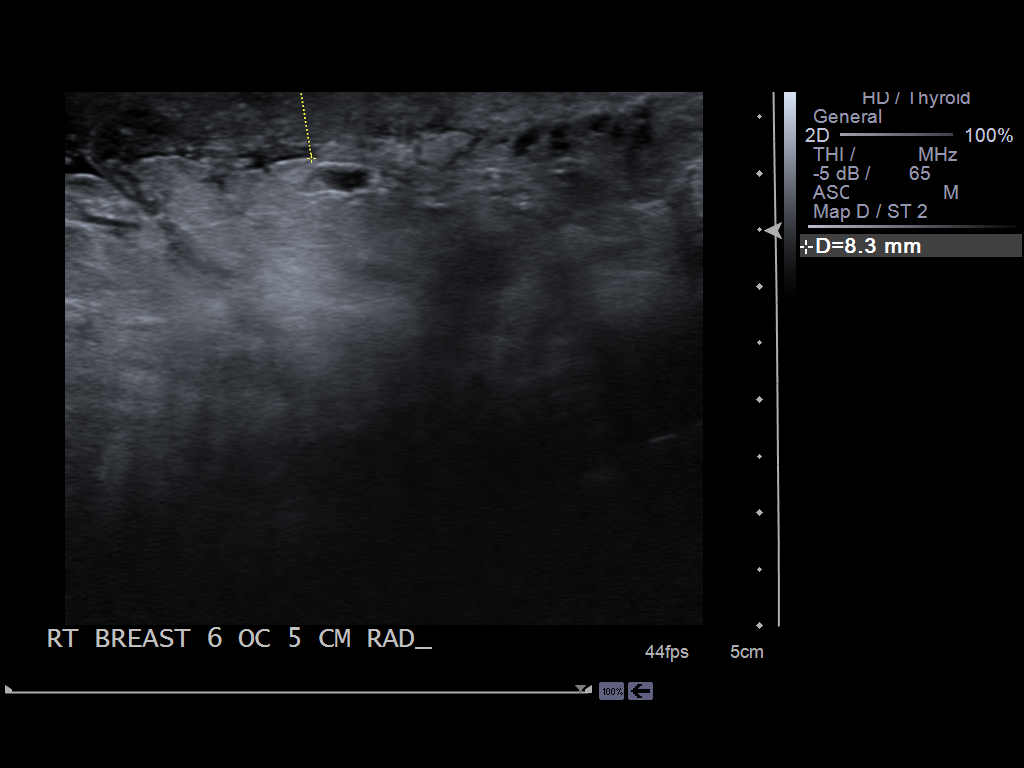
[im 5/6]
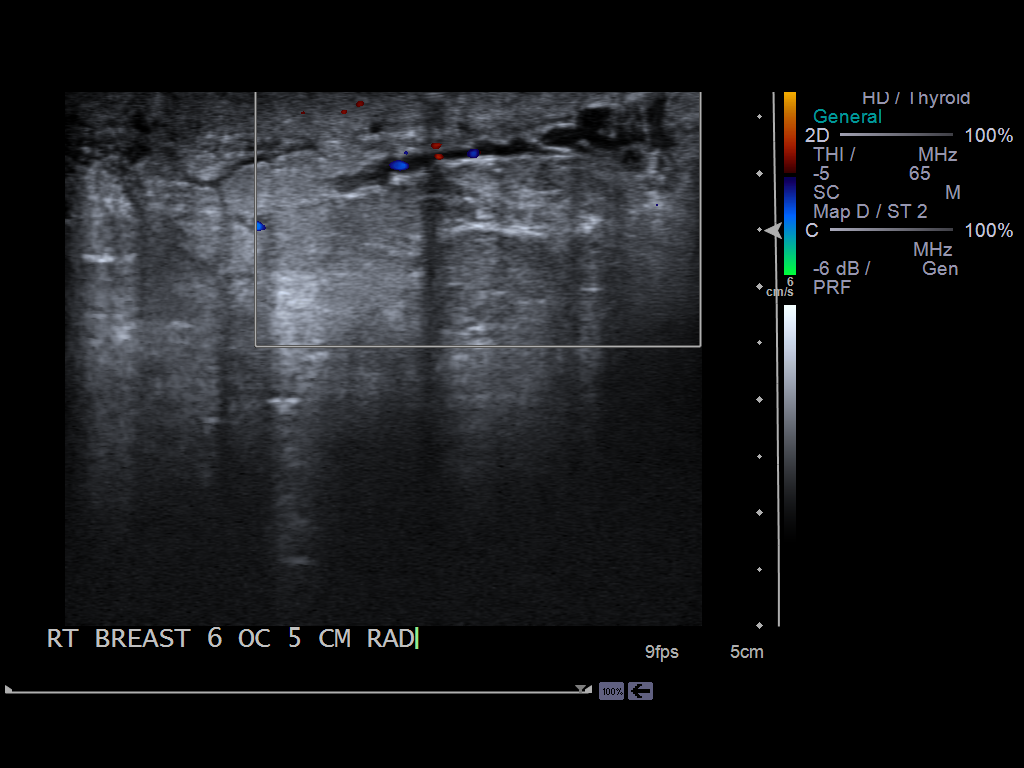
[im 6/6]
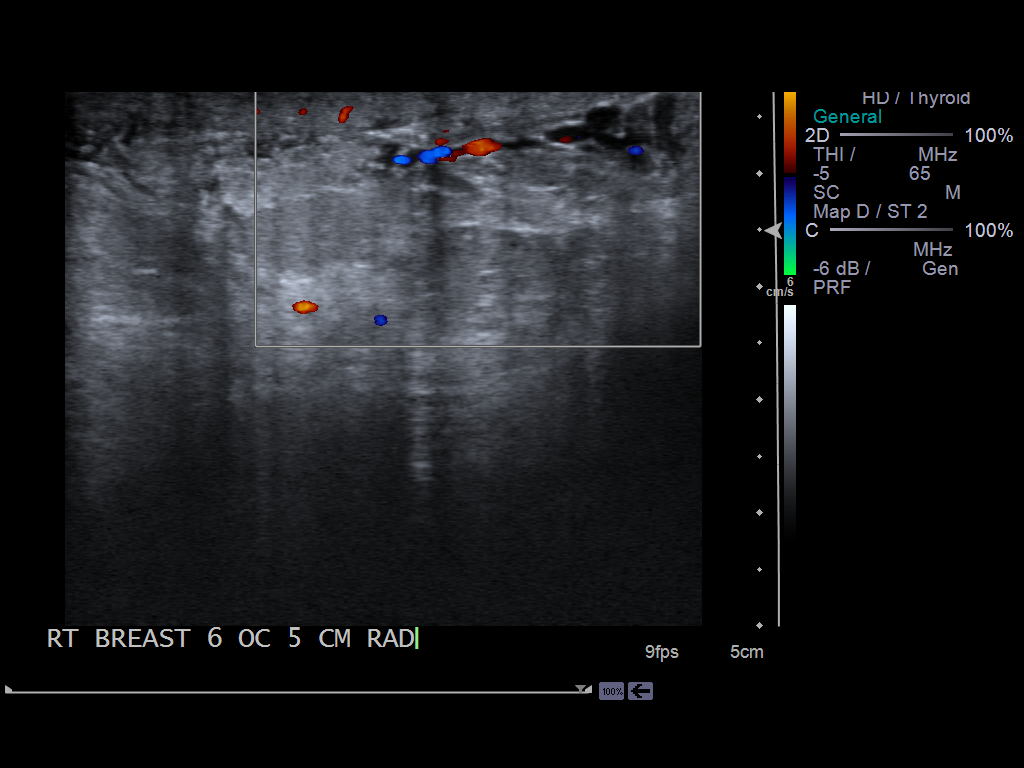

[6 of 6 positions shown; findings below may reference images not displayed]

FINDINGS: There is a scattered fibroglandular pattern.  There is
marked skin thickening throughout the right breast and there is
accentuation of the trabecular pattern within the right breast.
There is no discrete mass.  There are no worrisome
microcalcifications.  The left breast has a normal appearance and
is unchanged.
Mammographic images were processed with CAD.

On physical exam, there is diffuse skin thickening with erythema
and peau d'[REDACTED] throughout the right breast.  There is no
discrete palpable mass present at this time.

Ultrasound is performed, showing marked diffuse skin thickening
within the right breast with the skin thickness measuring up to 8
mm in diameter.  There is also edematous changes seen within the
soft tissues of the right breast.  There is no evidence for
abscess.  There is no mass or distortion.
IMPRESSION: Increased breast density and marked diffuse skin thickening.  This
is worrisome for possible inflammatory breast carcinoma versus
recurrent mastitis.  No evidence for a breast abscess.  Recommend
skin punch biopsy to exclude inflammatory carcinoma.

BI-RADS CATEGORY 4:  Suspicious abnormality - biopsy should be
considered.

## 2011-03-11 NOTE — Progress Notes (Signed)
UR Chart Review Completed  

## 2011-03-11 NOTE — Progress Notes (Signed)
Subjective: This lady is right breast cellulitis is clearly improving rather rapidly. She had dialysis yesterday. She is somewhat sleepy this morning again.           Physical Exam: Blood pressure 120/65, pulse 64, temperature 98 F (36.7 C), temperature source Oral, resp. rate 18, height 5\' 2"  (1.575 m), weight 60 kg (132 lb 4.4 oz), SpO2 97.00%. She looks systemically well. Heart sounds are present and normal. Lung fields are clear. Her right breast cellulitis clearly has improved even from yesterday. Today I was able to examine her right breast more thoroughly view to less pain and I wonder if there is some sort of mass or abscess underlying.   Investigations:  Recent Results (from the past 240 hour(s))  CULTURE, BLOOD (ROUTINE X 2)     Status: Normal (Preliminary result)   Collection Time   03/09/11  3:00 PM      Component Value Range Status Comment   Specimen Description BLOOD RIGHT ANTECUBITAL DRAWN BY RN   Final    Special Requests BOTTLES DRAWN AEROBIC AND ANAEROBIC 8CC   Final    Culture NO GROWTH 1 DAY   Final    Report Status PENDING   Incomplete   CULTURE, BLOOD (ROUTINE X 2)     Status: Normal (Preliminary result)   Collection Time   03/09/11  3:07 PM      Component Value Range Status Comment   Specimen Description BLOOD RIGHT FOREARM   Final    Special Requests BOTTLES DRAWN AEROBIC AND ANAEROBIC 6CC   Final    Culture NO GROWTH 1 DAY   Final    Report Status PENDING   Incomplete   MRSA PCR SCREENING     Status: Normal   Collection Time   03/10/11 12:20 AM      Component Value Range Status Comment   MRSA by PCR NEGATIVE  NEGATIVE  Final      Basic Metabolic Panel:  Basename 03/11/11 0519 03/10/11 0516  NA 136 133*  K 4.8 4.1  CL 97 94*  CO2 32 25  GLUCOSE 68* 70  BUN 26* 58*  CREATININE 8.57* 14.92*  CALCIUM 10.2 9.5  MG -- --  PHOS 6.2* --   Liver Function Tests:  Basename 03/10/11 0516 03/09/11 1500  AST 14 18  ALT 11 14  ALKPHOS 50 60    BILITOT 0.5 0.6  PROT 8.9* 10.6*  ALBUMIN 2.6* 3.2*     CBC:  Basename 03/11/11 0519 03/10/11 0516  WBC 3.5* 2.9*  NEUTROABS -- --  HGB 10.2* 9.4*  HCT 32.7* 28.4*  MCV 103.8* 101.1*  PLT 89* 92*    Dg Chest 2 View  03/09/2011  *RADIOLOGY REPORT*  Clinical Data: Chest pain.  History of CHF.  CHEST - 2 VIEW  Comparison: 12/13/2010 and 06/29/2008  Findings: Two views of the chest were obtained.  There is chronic enlargement of the hilar structures.  There is no evidence of focal airspace disease.  Heart and mediastinum are grossly stable. Atherosclerotic calcifications involving the thoracic aortic arch. Surgical clips in the right axilla.  High density material in the colon.  No evidence to suggest pleural effusions.  IMPRESSION: Stable chest radiograph findings without acute changes.  Chronic enlargement of the hilar structures.  Original Report Authenticated By: Markus Daft, M.D.      Medications: I have reviewed the patient's current medications.  Impression: 1. Right breast cellulitis, possible underlying abscess or mass. 2. HIV disease. 3. Hypertension. 4.  End-stage renal disease on hemodialysis. 5. Leukopenia, however improving. 6. Thrombocytopenia.     Plan: 1. Discontinue subcutaneous heparin in view of the thrombocytopenia. 2. Await ultrasound of the right breast to evaluate for abscess/mass. I think that the tenderness in the right breast still would make a mammogram very difficult or impossible. 3. Continue intravenous vancomycin.    LOS: 2 days   Doree Albee Pager [phone]  03/11/2011, 9:54 AM

## 2011-03-11 NOTE — Progress Notes (Addendum)
[name]  MRN: [id number]  DOB/AGE: 1950/10/14 61 y.o.  Primary Care Physician:No primary provider on file.  Admit date: 03/09/2011  Chief Complaint:  Chief Complaint  Patient presents with  . Breast Pain    S-Pt presented on  03/09/2011 with  Chief Complaint  Patient presents with  . Breast Pain  .    Pt today feels better. Pt offers NO new complaints.       . cholestyramine  4 g Oral Custom  . epoetin alfa  10,000 Units Intravenous Q T,Th,Sa-HD  . fentaNYL  25 mcg Transdermal Q48H  . Flora-Q  1 capsule Oral Daily  . gabapentin  300 mg Oral Q12H  . heparin  5,000 Units Subcutaneous Q8H  . lamiVUDine  50 mg Oral Daily  . lanthanum  3,000 mg Oral TID WC  . lidocaine  1 patch Transdermal Q24H  . multivitamin  1 tablet Oral Daily  . pantoprazole  40 mg Oral BID AC  . paricalcitol  5 mcg Intravenous Once in dialysis  . polyvinyl alcohol  1 drop Both Eyes BID  . raltegravir  400 mg Oral BID  . sevelamer  800 mg Oral TID WC  . sildenafil  20 mg Oral TID  . vancomycin  1,000 mg Intravenous Q T,Th,Sa-HD  . zidovudine  100 mg Oral TID         [id number] from the symptoms mentioned above,there are no other symptoms referable to all systems reviewed.  Physical Exam: Vital signs in last 24 hours: Temp:  [98 F (36.7 C)-98.7 F (37.1 C)] 98.7 F (37.1 C) (01/09 1400) Pulse Rate:  [8-85] 78  (01/09 1400) Resp:  [18-20] 18  (01/09 1400) BP: (86-120)/(53-65) 96/59 mmHg (01/09 1400) SpO2:  [95 %-97 %] 97 % (01/09 1400) Weight:  [132 lb 4.4 oz (60 kg)] 132 lb 4.4 oz (60 kg) (01/08 1710) Weight change: -2 lb 10.5 oz (-1.203 kg) Last BM Date: 03/10/11  Intake/Output from previous day: 01/08 0701 - 01/09 0700 In: 1133.3 [P.O.:360; I.V.:773.3] Out: 2500  Total I/O In: 240 [P.O.:240] Out: -    Physical Exam: Pt examied with chaperone ( RN) General- pt is awake,alert, oriented to time place and person Resp- No acute REsp distress, CTA B/L NO Rhonchi     Right breast tender, increase erythema and temp CVS- S1S2 regular ij rate and rhythm GIT- BS+, soft, NT, ND EXT- NO LE Edema, Cyanosis Access- Left AVF +  Lab Results: CBC  Basename 03/11/11 0519 03/10/11 0516  WBC 3.5* 2.9*  HGB 10.2* 9.4*  HCT 32.7* 28.4*  PLT 89* 92*    BMET  Basename 03/11/11 0519 03/10/11 0516  NA 136 133*  K 4.8 4.1  CL 97 94*  CO2 32 25  GLUCOSE 68* 70  BUN 26* 58*  CREATININE 8.57* 14.92*  CALCIUM 10.2 9.5    MICRO Recent Results (from the past 240 hour(s))  CULTURE, BLOOD (ROUTINE X 2)     Status: Normal (Preliminary result)   Collection Time   03/09/11  3:00 PM      Component Value Range Status Comment   Specimen Description BLOOD RIGHT ANTECUBITAL DRAWN BY RN   Final    Special Requests BOTTLES DRAWN AEROBIC AND ANAEROBIC 8CC   Final    Culture NO GROWTH 2 DAYS   Final    Report Status PENDING   Incomplete   CULTURE, BLOOD (ROUTINE X 2)     Status: Normal (Preliminary result)   Collection Time  03/09/11  3:07 PM      Component Value Range Status Comment   Specimen Description BLOOD RIGHT FOREARM   Final    Special Requests BOTTLES DRAWN AEROBIC AND ANAEROBIC 6CC   Final    Culture NO GROWTH 2 DAYS   Final    Report Status PENDING   Incomplete   MRSA PCR SCREENING     Status: Normal   Collection Time   03/10/11 12:20 AM      Component Value Range Status Comment   MRSA by PCR NEGATIVE  NEGATIVE  Final       Lab Results  Component Value Date   PTH 252.2* 06/13/2008   CALCIUM 10.2 03/11/2011   PHOS 6.2* 03/11/2011     Impression: 1)Renal  ESRD on HD                Pt was last dialyzed yesterday                  No need of HD today                                              2)HTN     BP on lower side                Target Organ damage                  CKD                  NOt on any anti HTN meds as BP low   3)Anemia HGb at goal (9--11)                  On EPO during HD          4)CKD Mineral-Bone Disorder                  PTH acceptable                 Secondary Hyperparathyroidism  Present                 Phosphorus not at goal_ On binders   5)ID- HIV +- ON MEDS           Breast Cellulitis- ON Meds   6)FEN              Normokalemic             Hyponatremic- Now better             Euvolemic             Albumin -Low  7)Acid base Co2 at goal    Plan:  Will dialzye in am  Will follow CKD BMD labs Agree with current treatment and plan      Dimond Crotty S 03/11/2011, 4:46 PM

## 2011-03-12 ENCOUNTER — Inpatient Hospital Stay (HOSPITAL_COMMUNITY): Payer: Medicare Other

## 2011-03-12 DIAGNOSIS — I10 Essential (primary) hypertension: Secondary | ICD-10-CM | POA: Diagnosis not present

## 2011-03-12 DIAGNOSIS — G894 Chronic pain syndrome: Secondary | ICD-10-CM | POA: Diagnosis not present

## 2011-03-12 DIAGNOSIS — N61 Mastitis without abscess: Secondary | ICD-10-CM | POA: Diagnosis not present

## 2011-03-12 DIAGNOSIS — B20 Human immunodeficiency virus [HIV] disease: Secondary | ICD-10-CM | POA: Diagnosis not present

## 2011-03-12 DIAGNOSIS — N186 End stage renal disease: Secondary | ICD-10-CM | POA: Diagnosis not present

## 2011-03-12 LAB — BASIC METABOLIC PANEL
BUN: 41 mg/dL — ABNORMAL HIGH (ref 6–23)
CO2: 29 mEq/L (ref 19–32)
Calcium: 10 mg/dL (ref 8.4–10.5)
Chloride: 99 mEq/L (ref 96–112)
Creatinine, Ser: 11.27 mg/dL — ABNORMAL HIGH (ref 0.50–1.10)
GFR calc Af Amer: 4 mL/min — ABNORMAL LOW (ref >90)
GFR calc non Af Amer: 3 mL/min — ABNORMAL LOW (ref >90)
Glucose, Bld: 93 mg/dL (ref 70–99)
Potassium: 4 mEq/L (ref 3.5–5.1)
Sodium: 136 mEq/L (ref 135–145)

## 2011-03-12 LAB — CBC
HCT: 28.6 % — ABNORMAL LOW (ref 36.0–46.0)
Hemoglobin: 9.2 g/dL — ABNORMAL LOW (ref 12.0–15.0)
MCH: 33.1 pg (ref 26.0–34.0)
MCHC: 32.2 g/dL (ref 30.0–36.0)
MCV: 102.9 fL — ABNORMAL HIGH (ref 78.0–100.0)
Platelets: 94 10*3/uL — ABNORMAL LOW (ref 150–400)
RBC: 2.78 MIL/uL — ABNORMAL LOW (ref 3.87–5.11)
RDW: 13.8 % (ref 11.5–15.5)
WBC: 2 10*3/uL — ABNORMAL LOW (ref 4.0–10.5)

## 2011-03-12 MED ORDER — LEVOFLOXACIN 250 MG PO TABS: 250.0000 mg | ORAL_TABLET | Freq: Every day | ORAL | Status: AC

## 2011-03-12 NOTE — Progress Notes (Signed)
Subjective: Interval History: has no complaint of nausea or vomiting. Presently she is feeling better..  Objective: Vital signs in last 24 hours: Temp:  [98.1 F (36.7 C)-98.7 F (37.1 C)] 98.1 F (36.7 C) (01/10 0659) Pulse Rate:  [76-81] 81  (01/10 0659) Resp:  [18-20] 20  (01/10 0659) BP: (96-134)/(59-84) 134/84 mmHg (01/10 0659) SpO2:  [96 %-97 %] 96 % (01/10 0659) Weight change:   Intake/Output from previous day: 01/09 0701 - 01/10 0700 In: 840 [P.O.:840] Out: -  Intake/Output this shift:    General appearance: alert, cooperative and no distress Resp: clear to auscultation bilaterally Chest wall: no tenderness, Her right breast he doesn't have any erythema has mild tenderness but seems to be better. Cardio: regular rate and rhythm, S1, S2 normal, no murmur, click, rub or gallop GI: soft, non-tender; bowel sounds normal; no masses,  no organomegaly Extremities: extremities normal, atraumatic, no cyanosis or edema  Lab Results:  Basename 03/12/11 0527 03/11/11 0519  WBC 2.0* 3.5*  HGB 9.2* 10.2*  HCT 28.6* 32.7*  PLT 94* 89*   BMET:  Basename 03/12/11 0527 03/11/11 0519  NA 136 136  K 4.0 4.8  CL 99 97  CO2 29 32  GLUCOSE 93 68*  BUN 41* 26*  CREATININE 11.27* 8.57*  CALCIUM 10.0 10.2   No results found for this basename: PTH:2 in the last 72 hours Iron Studies: No results found for this basename: IRON,TIBC,TRANSFERRIN,FERRITIN in the last 72 hours  Studies/Results: US Breast Right  03/11/2011  *RADIOLOGY REPORT*  Clinical Data:  Erythema, tenderness, and skin thickening involving the right breast. The patient had been previously asked to return for a short-term follow-up evaluation in November 2011 but failed return for a follow-up evaluation.  DIGITAL DIAGNOSTIC BILATERAL MAMMOGRAM WITH CAD AND RIGHT BREAST ULTRASOUND:  Comparison:  01/14/2010.  Findings:  There is a scattered fibroglandular pattern.  There is marked skin thickening throughout the right breast  and there is accentuation of the trabecular pattern within the right breast. There is no discrete mass.  There are no worrisome microcalcifications.  The left breast has a normal appearance and is unchanged. Mammographic images were processed with CAD.  On physical exam, there is diffuse skin thickening with erythema and peau d'orange throughout the right breast.  There is no discrete palpable mass present at this time.  Ultrasound is performed, showing marked diffuse skin thickening within the right breast with the skin thickness measuring up to 8 mm in diameter.  There is also edematous changes seen within the soft tissues of the right breast.  There is no evidence for abscess.  There is no mass or distortion.  IMPRESSION: Increased breast density and marked diffuse skin thickening.  This is worrisome for possible inflammatory breast carcinoma versus recurrent mastitis.  No evidence for a breast abscess.  Recommend skin punch biopsy to exclude inflammatory carcinoma.  BI-RADS CATEGORY 4:  Suspicious abnormality - biopsy should be considered.  Original Report Authenticated By: Altamese Cabal, M.D.   Mm Digital Diagnostic Bilat  03/11/2011  *RADIOLOGY REPORT*  Clinical Data:  Erythema, tenderness, and skin thickening involving the right breast. The patient had been previously asked to return for a short-term follow-up evaluation in November 2011 but failed return for a follow-up evaluation.  DIGITAL DIAGNOSTIC BILATERAL MAMMOGRAM WITH CAD AND RIGHT BREAST ULTRASOUND:  Comparison:  01/14/2010.  Findings:  There is a scattered fibroglandular pattern.  There is marked skin thickening throughout the right breast and there is accentuation of the  trabecular pattern within the right breast. There is no discrete mass.  There are no worrisome microcalcifications.  The left breast has a normal appearance and is unchanged. Mammographic images were processed with CAD.  On physical exam, there is diffuse skin thickening with  erythema and peau d'orange throughout the right breast.  There is no discrete palpable mass present at this time.  Ultrasound is performed, showing marked diffuse skin thickening within the right breast with the skin thickness measuring up to 8 mm in diameter.  There is also edematous changes seen within the soft tissues of the right breast.  There is no evidence for abscess.  There is no mass or distortion.  IMPRESSION: Increased breast density and marked diffuse skin thickening.  This is worrisome for possible inflammatory breast carcinoma versus recurrent mastitis.  No evidence for a breast abscess.  Recommend skin punch biopsy to exclude inflammatory carcinoma.  BI-RADS CATEGORY 4:  Suspicious abnormality - biopsy should be considered.  Original Report Authenticated By: Altamese Cabal, M.D.    I have reviewed the patient's current medications.  Assessment/Plan: Problem #1 possible cellulitis of her breast presently she is on antibiotics his a febrile and the erythema and the swelling has gone down. However there is a questionable mass with the ultrasound hence not sure whether she has underlying inflammatory cancer. Problem #2 end-stage renal disease she status post hemodialysis on Tuesday her pending creatinine is stable she'll have any uremic sinus symptoms. Problem #3 history of hypertension blood pressure seems to be reasonably controlled Problem #3 history of HIV positive Problem #5 history of anemia H&H stable Problem #6 history of charity History of  diarrhea on and off her chronic. Plan patient will be going to have dialysis tomorrow which is her regular schedule. If patient is going to be discharged I will follow  as an outpatient. I agree with evaluation by surgery for possible biopsy.    LOS: 3 days   Saddie Sandeen S 03/12/2011,9:14 AM

## 2011-03-12 NOTE — Progress Notes (Addendum)
Pt d/c today by MD back to [facility].  Pt and facility aware and agreeable.  Pt to transfer via facility van. If pt does not have surgery consult prior to d/c today, RN will notify [facility] of need for follow up.   [name]

## 2011-03-12 NOTE — Progress Notes (Signed)
Pt d/c home via w/c to be transferred by [employer].  She left the floor accompanied by staff in stable condition.  Report called to [employer] of [facility].  She verbalized understanding and voiced no further complaints at this time.  Informed her I would be here until 7pm and she could notify with any further questions.

## 2011-03-12 NOTE — Discharge Summary (Deleted)
Physician Discharge Summary  Patient ID: [name] MRN: [id number] DOB/AGE: 1950/07/15 61 y.o.  Admit date: 03/09/2011 Discharge date: 03/12/2011    Discharge Diagnoses:  1. Cellulitis of the right breast with possible underlying inflammatory breast cancer. 2. End-stage renal disease on hemodialysis. 3. HIV disease. 4. Hypertension.   Current Discharge Medication List    START taking these medications   Details  levofloxacin (LEVAQUIN) 250 MG tablet Take 1 tablet (250 mg total) by mouth daily. Qty: 5 tablet, Refills: 0      CONTINUE these medications which have NOT CHANGED   Details  b complex-vitamin c-folic acid (NEPHRO-VITE) 0.8 MG TABS Take 0.8 mg by mouth at bedtime.      fentaNYL (DURAGESIC - DOSED MCG/HR) 25 MCG/HR Place 1 patch onto the skin every other day.      gabapentin (NEURONTIN) 300 MG capsule Take 300 mg by mouth every 12 (twelve) hours.      hydroxypropyl methylcellulose (ISOPTO TEARS) 2.5 % ophthalmic solution Place 1 drop into both eyes 2 (two) times daily.      lamivudine (EPIVIR) 100 MG tablet Take 0.5 tablets (50 mg total) by mouth daily. Take one half tablet daily Qty: 150 tablet, Refills: 90   Associated Diagnoses: HIV (human immunodeficiency virus infection)    lanthanum (FOSRENOL) 500 MG chewable tablet Chew 3,000 mg by mouth 3 (three) times daily with meals.     lidocaine (LIDODERM) 5 % Place 1 patch onto the skin daily. Remove & Discard patch within 12 hours or as directed by MD     pantoprazole (PROTONIX) 40 MG tablet Take 1 tablet (40 mg total) by mouth 2 (two) times daily before a meal.   Associated Diagnoses: Esophageal reflux    raltegravir (ISENTRESS) 400 MG tablet Take 400 mg by mouth 2 (two) times daily.      sildenafil (REVATIO) 20 MG tablet Take 20 mg by mouth 3 (three) times daily.      Whey Protein Isolate POWD Take 1 Package by mouth 2 (two) times daily. With vanilla pudding     zidovudine (RETROVIR) 100 MG capsule Take  100 mg by mouth 3 (three) times daily.      ALPRAZolam (XANAX) 0.5 MG tablet Take 0.5 mg by mouth 2 (two) times daily as needed. anxiety    epoetin alfa (EPOGEN) 10000 UNIT/ML injection Inject 10,000 Units into the skin. To be given at dialysis     ondansetron (ZOFRAN) 4 MG tablet Take 2 mg by mouth every 4 (four) hours as needed. nausea    oxycodone (OXY-IR) 5 MG capsule Take 5 mg by mouth every 6 (six) hours as needed. For pain      STOP taking these medications     acetaminophen (TYLENOL) 325 MG tablet      albuterol (VENTOLIN HFA) 108 (90 BASE) MCG/ACT inhaler      cholestyramine (QUESTRAN) 4 G packet      Flora-Q (FLORA-Q) CAPS      HYDROcodone-acetaminophen (VICODIN) 5-500 MG per tablet      paricalcitol (ZEMPLAR) 5 MCG/ML injection      sevelamer (RENVELA) 800 MG tablet         Discharged Condition: Improved and stable.    Consults: Nephrology, Dr Hinda Lenis. Surgery has been consulted, awaiting opinion.  Significant Diagnostic Studies: Dg Chest 2 View  03/09/2011  *RADIOLOGY REPORT*  Clinical Data: Chest pain.  History of CHF.  CHEST - 2 VIEW  Comparison: 12/13/2010 and 06/29/2008  Findings: Two views of the  chest were obtained.  There is chronic enlargement of the hilar structures.  There is no evidence of focal airspace disease.  Heart and mediastinum are grossly stable. Atherosclerotic calcifications involving the thoracic aortic arch. Surgical clips in the right axilla.  High density material in the colon.  No evidence to suggest pleural effusions.  IMPRESSION: Stable chest radiograph findings without acute changes.  Chronic enlargement of the hilar structures.  Original Report Authenticated By: Markus Daft, M.D.   US Breast Right  03/11/2011  *RADIOLOGY REPORT*  Clinical Data:  Erythema, tenderness, and skin thickening involving the right breast. The patient had been previously asked to return for a short-term follow-up evaluation in November 2011 but failed return for  a follow-up evaluation.  DIGITAL DIAGNOSTIC BILATERAL MAMMOGRAM WITH CAD AND RIGHT BREAST ULTRASOUND:  Comparison:  01/14/2010.  Findings:  There is a scattered fibroglandular pattern.  There is marked skin thickening throughout the right breast and there is accentuation of the trabecular pattern within the right breast. There is no discrete mass.  There are no worrisome microcalcifications.  The left breast has a normal appearance and is unchanged. Mammographic images were processed with CAD.  On physical exam, there is diffuse skin thickening with erythema and peau d'orange throughout the right breast.  There is no discrete palpable mass present at this time.  Ultrasound is performed, showing marked diffuse skin thickening within the right breast with the skin thickness measuring up to 8 mm in diameter.  There is also edematous changes seen within the soft tissues of the right breast.  There is no evidence for abscess.  There is no mass or distortion.  IMPRESSION: Increased breast density and marked diffuse skin thickening.  This is worrisome for possible inflammatory breast carcinoma versus recurrent mastitis.  No evidence for a breast abscess.  Recommend skin punch biopsy to exclude inflammatory carcinoma.  BI-RADS CATEGORY 4:  Suspicious abnormality - biopsy should be considered.  Original Report Authenticated By: Altamese Cabal, M.D.   Mm Digital Diagnostic Bilat  03/11/2011  *RADIOLOGY REPORT*  Clinical Data:  Erythema, tenderness, and skin thickening involving the right breast. The patient had been previously asked to return for a short-term follow-up evaluation in November 2011 but failed return for a follow-up evaluation.  DIGITAL DIAGNOSTIC BILATERAL MAMMOGRAM WITH CAD AND RIGHT BREAST ULTRASOUND:  Comparison:  01/14/2010.  Findings:  There is a scattered fibroglandular pattern.  There is marked skin thickening throughout the right breast and there is accentuation of the trabecular pattern within the  right breast. There is no discrete mass.  There are no worrisome microcalcifications.  The left breast has a normal appearance and is unchanged. Mammographic images were processed with CAD.  On physical exam, there is diffuse skin thickening with erythema and peau d'orange throughout the right breast.  There is no discrete palpable mass present at this time.  Ultrasound is performed, showing marked diffuse skin thickening within the right breast with the skin thickness measuring up to 8 mm in diameter.  There is also edematous changes seen within the soft tissues of the right breast.  There is no evidence for abscess.  There is no mass or distortion.  IMPRESSION: Increased breast density and marked diffuse skin thickening.  This is worrisome for possible inflammatory breast carcinoma versus recurrent mastitis.  No evidence for a breast abscess.  Recommend skin punch biopsy to exclude inflammatory carcinoma.  BI-RADS CATEGORY 4:  Suspicious abnormality - biopsy should be considered.  Original Report Authenticated By: Oval Linsey  Glennon Mac, M.D.    Lab Results: Basic Metabolic Panel:  Basename 03/12/11 0527 03/11/11 0519  NA 136 136  K 4.0 4.8  CL 99 97  CO2 29 32  GLUCOSE 93 68*  BUN 41* 26*  CREATININE 11.27* 8.57*  CALCIUM 10.0 10.2  MG -- --  PHOS -- 6.2*   Liver Function Tests:  Basename 03/10/11 0516 03/09/11 1500  AST 14 18  ALT 11 14  ALKPHOS 50 60  BILITOT 0.5 0.6  PROT 8.9* 10.6*  ALBUMIN 2.6* 3.2*     CBC:  Basename 03/12/11 0527 03/11/11 0519  WBC 2.0* 3.5*  NEUTROABS -- --  HGB 9.2* 10.2*  HCT 28.6* 32.7*  MCV 102.9* 103.8*  PLT 94* 89*    Recent Results (from the past 240 hour(s))  CULTURE, BLOOD (ROUTINE X 2)     Status: Normal (Preliminary result)   Collection Time   03/09/11  3:00 PM      Component Value Range Status Comment   Specimen Description BLOOD RIGHT ANTECUBITAL DRAWN BY RN   Final    Special Requests BOTTLES DRAWN AEROBIC AND ANAEROBIC 8CC   Final     Culture NO GROWTH 2 DAYS   Final    Report Status PENDING   Incomplete   CULTURE, BLOOD (ROUTINE X 2)     Status: Normal (Preliminary result)   Collection Time   03/09/11  3:07 PM      Component Value Range Status Comment   Specimen Description BLOOD RIGHT FOREARM   Final    Special Requests BOTTLES DRAWN AEROBIC AND ANAEROBIC 6CC   Final    Culture NO GROWTH 2 DAYS   Final    Report Status PENDING   Incomplete   MRSA PCR SCREENING     Status: Normal   Collection Time   03/10/11 12:20 AM      Component Value Range Status Comment   MRSA by PCR NEGATIVE  NEGATIVE  Final      Hospital Course: This 61 year old lady, who has end-stage renal disease and is on hemodialysis, presented to the hospital with a history of 2-3 days of right breast swelling, erythema and pain. The patient also has HIV disease. Clinically, it was felt that she had significant cellulitis affecting the whole of the right breast. She was admitted to the hospital and started on intravenous vancomycin. This made dramatic improvement to her clinical condition and now her right breast appears to be almost normal. However, due to the concern regarding a mass underneath the cellulitis, a mammogram was obtained followed by an ultrasound. This result was concerning for inflammatory breast cancer. Surgery has been consulted but has all the time of this dictation, they have not seen her yet. He should be seeing her today later.  Discharge Exam: Blood pressure 134/84, pulse 81, temperature 98.1 F (36.7 C), temperature source Oral, resp. rate 20, height 5\' 2"  (1.575 m), weight 60 kg (132 lb 4.4 oz), SpO2 96.00%. She looks systemically well. She feels much improved and she is alert and orientated. Heart sounds are present and normal. Lung fields are clear. The right breast is almost completely resolved of the cellulitis clinically. It is soft, nontender, a mass is possibly present.  Disposition: Skilled nursing facility. She will need a  further 5 day course of Levaquin.  Discharge Orders    Future Appointments: Provider: Department: Dept Phone: Center:   05/07/2011 2:30 PM Bobby Rumpf, MD [facility] [phone] [facility]  Future Orders Please Complete By Expires   Diet - low sodium heart healthy      Increase activity slowly           SignedDoree Albee Pager [phone]  03/12/2011, 8:46 AM

## 2011-03-12 NOTE — Progress Notes (Signed)
In patient notes, doctor stated that he wanted to D/C heparin. The order management still said that it was ordered. Doctor was notified and doctor stated to go ahead and give the heparin.

## 2011-03-12 NOTE — Discharge Summary (Addendum)
Physician Discharge Summary  Patient ID: [name] MRN: [id number] DOB/AGE: April 06, 1950 61 y.o.  Admit date: 03/09/2011 Discharge date: 03/12/2011    Discharge Diagnoses:  1. Cellulitis of the right breast with possible underlying inflammatory breast cancer. 2. End-stage renal disease on hemodialysis. 3. HIV disease. 4. Hypertension.   Current Discharge Medication List    START taking these medications   Details  levofloxacin (LEVAQUIN) 250 MG tablet Take 1 tablet (250 mg total) by mouth daily. Qty: 5 tablet, Refills: 0      CONTINUE these medications which have NOT CHANGED   Details  b complex-vitamin c-folic acid (NEPHRO-VITE) 0.8 MG TABS Take 0.8 mg by mouth at bedtime.      fentaNYL (DURAGESIC - DOSED MCG/HR) 25 MCG/HR Place 1 patch onto the skin every other day.      gabapentin (NEURONTIN) 300 MG capsule Take 300 mg by mouth every 12 (twelve) hours.      hydroxypropyl methylcellulose (ISOPTO TEARS) 2.5 % ophthalmic solution Place 1 drop into both eyes 2 (two) times daily.      lamivudine (EPIVIR) 100 MG tablet Take 0.5 tablets (50 mg total) by mouth daily. Take one half tablet daily Qty: 150 tablet, Refills: 90   Associated Diagnoses: HIV (human immunodeficiency virus infection)    lanthanum (FOSRENOL) 500 MG chewable tablet Chew 3,000 mg by mouth 3 (three) times daily with meals.     lidocaine (LIDODERM) 5 % Place 1 patch onto the skin daily. Remove & Discard patch within 12 hours or as directed by MD     pantoprazole (PROTONIX) 40 MG tablet Take 1 tablet (40 mg total) by mouth 2 (two) times daily before a meal.   Associated Diagnoses: Esophageal reflux    raltegravir (ISENTRESS) 400 MG tablet Take 400 mg by mouth 2 (two) times daily.      sildenafil (REVATIO) 20 MG tablet Take 20 mg by mouth 3 (three) times daily.      Whey Protein Isolate POWD Take 1 Package by mouth 2 (two) times daily. With vanilla pudding     zidovudine (RETROVIR) 100 MG capsule Take  100 mg by mouth 3 (three) times daily.      ALPRAZolam (XANAX) 0.5 MG tablet Take 0.5 mg by mouth 2 (two) times daily as needed. anxiety    epoetin alfa (EPOGEN) 10000 UNIT/ML injection Inject 10,000 Units into the skin. To be given at dialysis     ondansetron (ZOFRAN) 4 MG tablet Take 2 mg by mouth every 4 (four) hours as needed. nausea    oxycodone (OXY-IR) 5 MG capsule Take 5 mg by mouth every 6 (six) hours as needed. For pain      STOP taking these medications     acetaminophen (TYLENOL) 325 MG tablet      albuterol (VENTOLIN HFA) 108 (90 BASE) MCG/ACT inhaler      cholestyramine (QUESTRAN) 4 G packet      Flora-Q (FLORA-Q) CAPS      HYDROcodone-acetaminophen (VICODIN) 5-500 MG per tablet      paricalcitol (ZEMPLAR) 5 MCG/ML injection      sevelamer (RENVELA) 800 MG tablet         Discharged Condition: Improved and stable.    Consults: Nephrology, Dr Hinda Lenis. Surgery has been consulted, awaiting opinion.  Significant Diagnostic Studies: Dg Chest 2 View  03/09/2011  *RADIOLOGY REPORT*  Clinical Data: Chest pain.  History of CHF.  CHEST - 2 VIEW  Comparison: 12/13/2010 and 06/29/2008  Findings: Two views of the  chest were obtained.  There is chronic enlargement of the hilar structures.  There is no evidence of focal airspace disease.  Heart and mediastinum are grossly stable. Atherosclerotic calcifications involving the thoracic aortic arch. Surgical clips in the right axilla.  High density material in the colon.  No evidence to suggest pleural effusions.  IMPRESSION: Stable chest radiograph findings without acute changes.  Chronic enlargement of the hilar structures.  Original Report Authenticated By: Markus Daft, M.D.   US Breast Right  03/11/2011  *RADIOLOGY REPORT*  Clinical Data:  Erythema, tenderness, and skin thickening involving the right breast. The patient had been previously asked to return for a short-term follow-up evaluation in November 2011 but failed return for  a follow-up evaluation.  DIGITAL DIAGNOSTIC BILATERAL MAMMOGRAM WITH CAD AND RIGHT BREAST ULTRASOUND:  Comparison:  01/14/2010.  Findings:  There is a scattered fibroglandular pattern.  There is marked skin thickening throughout the right breast and there is accentuation of the trabecular pattern within the right breast. There is no discrete mass.  There are no worrisome microcalcifications.  The left breast has a normal appearance and is unchanged. Mammographic images were processed with CAD.  On physical exam, there is diffuse skin thickening with erythema and peau d'orange throughout the right breast.  There is no discrete palpable mass present at this time.  Ultrasound is performed, showing marked diffuse skin thickening within the right breast with the skin thickness measuring up to 8 mm in diameter.  There is also edematous changes seen within the soft tissues of the right breast.  There is no evidence for abscess.  There is no mass or distortion.  IMPRESSION: Increased breast density and marked diffuse skin thickening.  This is worrisome for possible inflammatory breast carcinoma versus recurrent mastitis.  No evidence for a breast abscess.  Recommend skin punch biopsy to exclude inflammatory carcinoma.  BI-RADS CATEGORY 4:  Suspicious abnormality - biopsy should be considered.  Original Report Authenticated By: Altamese Cabal, M.D.   Mm Digital Diagnostic Bilat  03/11/2011  *RADIOLOGY REPORT*  Clinical Data:  Erythema, tenderness, and skin thickening involving the right breast. The patient had been previously asked to return for a short-term follow-up evaluation in November 2011 but failed return for a follow-up evaluation.  DIGITAL DIAGNOSTIC BILATERAL MAMMOGRAM WITH CAD AND RIGHT BREAST ULTRASOUND:  Comparison:  01/14/2010.  Findings:  There is a scattered fibroglandular pattern.  There is marked skin thickening throughout the right breast and there is accentuation of the trabecular pattern within the  right breast. There is no discrete mass.  There are no worrisome microcalcifications.  The left breast has a normal appearance and is unchanged. Mammographic images were processed with CAD.  On physical exam, there is diffuse skin thickening with erythema and peau d'orange throughout the right breast.  There is no discrete palpable mass present at this time.  Ultrasound is performed, showing marked diffuse skin thickening within the right breast with the skin thickness measuring up to 8 mm in diameter.  There is also edematous changes seen within the soft tissues of the right breast.  There is no evidence for abscess.  There is no mass or distortion.  IMPRESSION: Increased breast density and marked diffuse skin thickening.  This is worrisome for possible inflammatory breast carcinoma versus recurrent mastitis.  No evidence for a breast abscess.  Recommend skin punch biopsy to exclude inflammatory carcinoma.  BI-RADS CATEGORY 4:  Suspicious abnormality - biopsy should be considered.  Original Report Authenticated By: Oval Linsey  Glennon Mac, M.D.    Lab Results: Basic Metabolic Panel:  Basename 03/12/11 0527 03/11/11 0519  NA 136 136  K 4.0 4.8  CL 99 97  CO2 29 32  GLUCOSE 93 68*  BUN 41* 26*  CREATININE 11.27* 8.57*  CALCIUM 10.0 10.2  MG -- --  PHOS -- 6.2*   Liver Function Tests:  Basename 03/10/11 0516 03/09/11 1500  AST 14 18  ALT 11 14  ALKPHOS 50 60  BILITOT 0.5 0.6  PROT 8.9* 10.6*  ALBUMIN 2.6* 3.2*     CBC:  Basename 03/12/11 0527 03/11/11 0519  WBC 2.0* 3.5*  NEUTROABS -- --  HGB 9.2* 10.2*  HCT 28.6* 32.7*  MCV 102.9* 103.8*  PLT 94* 89*    Recent Results (from the past 240 hour(s))  CULTURE, BLOOD (ROUTINE X 2)     Status: Normal (Preliminary result)   Collection Time   03/09/11  3:00 PM      Component Value Range Status Comment   Specimen Description BLOOD RIGHT ANTECUBITAL DRAWN BY RN   Final    Special Requests BOTTLES DRAWN AEROBIC AND ANAEROBIC 8CC   Final     Culture NO GROWTH 2 DAYS   Final    Report Status PENDING   Incomplete   CULTURE, BLOOD (ROUTINE X 2)     Status: Normal (Preliminary result)   Collection Time   03/09/11  3:07 PM      Component Value Range Status Comment   Specimen Description BLOOD RIGHT FOREARM   Final    Special Requests BOTTLES DRAWN AEROBIC AND ANAEROBIC 6CC   Final    Culture NO GROWTH 2 DAYS   Final    Report Status PENDING   Incomplete   MRSA PCR SCREENING     Status: Normal   Collection Time   03/10/11 12:20 AM      Component Value Range Status Comment   MRSA by PCR NEGATIVE  NEGATIVE  Final      Hospital Course: This 61 year old lady, who has end-stage renal disease and is on hemodialysis, presented to the hospital with a history of 2-3 days of right breast swelling, erythema and pain. The patient also has HIV disease. Clinically, it was felt that she had significant cellulitis affecting the whole of the right breast. She was admitted to the hospital and started on intravenous vancomycin. This made dramatic improvement to her clinical condition and now her right breast appears to be almost normal. However, due to the concern regarding a mass underneath the cellulitis, a mammogram was obtained followed by an ultrasound. This result was concerning for inflammatory breast cancer. Surgery has been consulted but has all the time of this dictation, they have not seen her yet. He should be seeing her today later.  Discharge Exam: Blood pressure 134/84, pulse 81, temperature 98.1 F (36.7 C), temperature source Oral, resp. rate 20, height 5\' 2"  (1.575 m), weight 60 kg (132 lb 4.4 oz), SpO2 96.00%. She looks systemically well. She feels much improved and she is alert and orientated. Heart sounds are present and normal. Lung fields are clear. The right breast is almost completely resolved of the cellulitis clinically. It is soft, nontender, a mass is possibly present.  Disposition: SNF. She will have further 5 day course of  Levaquin as the antibiotic of choice for her right breast cellulitis.  Discharge Orders    Future Appointments: Provider: Department: Dept Phone: Center:   05/07/2011 2:30 PM Bobby Rumpf, MD [facility] [phone] [facility]     Future Orders Please Complete By Expires   Diet - low sodium heart healthy      Increase activity slowly           Signed: Doree Albee Pager [phone]  03/12/2011, 8:34 AM

## 2011-03-13 DIAGNOSIS — M6281 Muscle weakness (generalized): Secondary | ICD-10-CM | POA: Diagnosis not present

## 2011-03-13 DIAGNOSIS — R262 Difficulty in walking, not elsewhere classified: Secondary | ICD-10-CM | POA: Diagnosis not present

## 2011-03-13 LAB — PTH, INTACT AND CALCIUM
Calcium, Total (PTH): 9 mg/dL (ref 8.4–10.5)
PTH: 364.8 pg/mL — ABNORMAL HIGH (ref 14.0–72.0)

## 2011-03-14 DIAGNOSIS — R262 Difficulty in walking, not elsewhere classified: Secondary | ICD-10-CM | POA: Diagnosis not present

## 2011-03-14 DIAGNOSIS — M6281 Muscle weakness (generalized): Secondary | ICD-10-CM | POA: Diagnosis not present

## 2011-03-16 DIAGNOSIS — M6281 Muscle weakness (generalized): Secondary | ICD-10-CM | POA: Diagnosis not present

## 2011-03-16 DIAGNOSIS — R262 Difficulty in walking, not elsewhere classified: Secondary | ICD-10-CM | POA: Diagnosis not present

## 2011-03-17 DIAGNOSIS — N61 Mastitis without abscess: Secondary | ICD-10-CM | POA: Diagnosis not present

## 2011-03-17 DIAGNOSIS — M6281 Muscle weakness (generalized): Secondary | ICD-10-CM | POA: Diagnosis not present

## 2011-03-17 DIAGNOSIS — R262 Difficulty in walking, not elsewhere classified: Secondary | ICD-10-CM | POA: Diagnosis not present

## 2011-03-18 DIAGNOSIS — R262 Difficulty in walking, not elsewhere classified: Secondary | ICD-10-CM | POA: Diagnosis not present

## 2011-03-18 DIAGNOSIS — M6281 Muscle weakness (generalized): Secondary | ICD-10-CM | POA: Diagnosis not present

## 2011-03-19 DIAGNOSIS — M6281 Muscle weakness (generalized): Secondary | ICD-10-CM | POA: Diagnosis not present

## 2011-03-19 DIAGNOSIS — R262 Difficulty in walking, not elsewhere classified: Secondary | ICD-10-CM | POA: Diagnosis not present

## 2011-03-20 DIAGNOSIS — M6281 Muscle weakness (generalized): Secondary | ICD-10-CM | POA: Diagnosis not present

## 2011-03-20 DIAGNOSIS — R262 Difficulty in walking, not elsewhere classified: Secondary | ICD-10-CM | POA: Diagnosis not present

## 2011-03-23 DIAGNOSIS — M6281 Muscle weakness (generalized): Secondary | ICD-10-CM | POA: Diagnosis not present

## 2011-03-23 DIAGNOSIS — R262 Difficulty in walking, not elsewhere classified: Secondary | ICD-10-CM | POA: Diagnosis not present

## 2011-03-24 ENCOUNTER — Ambulatory Visit: Payer: Medicare Other | Admitting: Infectious Diseases

## 2011-03-24 DIAGNOSIS — R262 Difficulty in walking, not elsewhere classified: Secondary | ICD-10-CM | POA: Diagnosis not present

## 2011-03-24 DIAGNOSIS — M6281 Muscle weakness (generalized): Secondary | ICD-10-CM | POA: Diagnosis not present

## 2011-03-25 DIAGNOSIS — M6281 Muscle weakness (generalized): Secondary | ICD-10-CM | POA: Diagnosis not present

## 2011-03-25 DIAGNOSIS — R262 Difficulty in walking, not elsewhere classified: Secondary | ICD-10-CM | POA: Diagnosis not present

## 2011-03-26 DIAGNOSIS — B20 Human immunodeficiency virus [HIV] disease: Secondary | ICD-10-CM | POA: Diagnosis not present

## 2011-03-26 DIAGNOSIS — I120 Hypertensive chronic kidney disease with stage 5 chronic kidney disease or end stage renal disease: Secondary | ICD-10-CM | POA: Diagnosis not present

## 2011-03-26 DIAGNOSIS — K219 Gastro-esophageal reflux disease without esophagitis: Secondary | ICD-10-CM | POA: Diagnosis not present

## 2011-03-26 DIAGNOSIS — T82898A Other specified complication of vascular prosthetic devices, implants and grafts, initial encounter: Secondary | ICD-10-CM | POA: Diagnosis not present

## 2011-03-26 DIAGNOSIS — Z91040 Latex allergy status: Secondary | ICD-10-CM | POA: Diagnosis not present

## 2011-03-26 DIAGNOSIS — N186 End stage renal disease: Secondary | ICD-10-CM | POA: Diagnosis not present

## 2011-03-26 DIAGNOSIS — F3289 Other specified depressive episodes: Secondary | ICD-10-CM | POA: Diagnosis not present

## 2011-03-26 DIAGNOSIS — Z87891 Personal history of nicotine dependence: Secondary | ICD-10-CM | POA: Diagnosis not present

## 2011-03-26 DIAGNOSIS — Z880 Allergy status to penicillin: Secondary | ICD-10-CM | POA: Diagnosis not present

## 2011-03-26 DIAGNOSIS — Z9981 Dependence on supplemental oxygen: Secondary | ICD-10-CM | POA: Diagnosis not present

## 2011-03-27 DIAGNOSIS — R262 Difficulty in walking, not elsewhere classified: Secondary | ICD-10-CM | POA: Diagnosis not present

## 2011-03-27 DIAGNOSIS — M6281 Muscle weakness (generalized): Secondary | ICD-10-CM | POA: Diagnosis not present

## 2011-03-28 DIAGNOSIS — M6281 Muscle weakness (generalized): Secondary | ICD-10-CM | POA: Diagnosis not present

## 2011-03-28 DIAGNOSIS — R262 Difficulty in walking, not elsewhere classified: Secondary | ICD-10-CM | POA: Diagnosis not present

## 2011-03-30 DIAGNOSIS — R262 Difficulty in walking, not elsewhere classified: Secondary | ICD-10-CM | POA: Diagnosis not present

## 2011-03-30 DIAGNOSIS — M6281 Muscle weakness (generalized): Secondary | ICD-10-CM | POA: Diagnosis not present

## 2011-03-31 DIAGNOSIS — M6281 Muscle weakness (generalized): Secondary | ICD-10-CM | POA: Diagnosis not present

## 2011-03-31 DIAGNOSIS — R262 Difficulty in walking, not elsewhere classified: Secondary | ICD-10-CM | POA: Diagnosis not present

## 2011-04-01 DIAGNOSIS — R262 Difficulty in walking, not elsewhere classified: Secondary | ICD-10-CM | POA: Diagnosis not present

## 2011-04-01 DIAGNOSIS — M6281 Muscle weakness (generalized): Secondary | ICD-10-CM | POA: Diagnosis not present

## 2011-04-02 DIAGNOSIS — N186 End stage renal disease: Secondary | ICD-10-CM | POA: Diagnosis not present

## 2011-04-02 DIAGNOSIS — T82598A Other mechanical complication of other cardiac and vascular devices and implants, initial encounter: Secondary | ICD-10-CM | POA: Diagnosis not present

## 2011-04-02 DIAGNOSIS — M6281 Muscle weakness (generalized): Secondary | ICD-10-CM | POA: Diagnosis not present

## 2011-04-02 DIAGNOSIS — R262 Difficulty in walking, not elsewhere classified: Secondary | ICD-10-CM | POA: Diagnosis not present

## 2011-04-03 DIAGNOSIS — D509 Iron deficiency anemia, unspecified: Secondary | ICD-10-CM | POA: Diagnosis not present

## 2011-04-03 DIAGNOSIS — D472 Monoclonal gammopathy: Secondary | ICD-10-CM | POA: Diagnosis not present

## 2011-04-03 DIAGNOSIS — D631 Anemia in chronic kidney disease: Secondary | ICD-10-CM | POA: Diagnosis not present

## 2011-04-03 DIAGNOSIS — N2581 Secondary hyperparathyroidism of renal origin: Secondary | ICD-10-CM | POA: Diagnosis not present

## 2011-04-03 DIAGNOSIS — N186 End stage renal disease: Secondary | ICD-10-CM | POA: Diagnosis not present

## 2011-04-03 DIAGNOSIS — M6281 Muscle weakness (generalized): Secondary | ICD-10-CM | POA: Diagnosis not present

## 2011-04-03 DIAGNOSIS — R262 Difficulty in walking, not elsewhere classified: Secondary | ICD-10-CM | POA: Diagnosis not present

## 2011-04-03 DIAGNOSIS — N039 Chronic nephritic syndrome with unspecified morphologic changes: Secondary | ICD-10-CM | POA: Diagnosis not present

## 2011-04-06 DIAGNOSIS — N2581 Secondary hyperparathyroidism of renal origin: Secondary | ICD-10-CM | POA: Diagnosis not present

## 2011-04-06 DIAGNOSIS — N039 Chronic nephritic syndrome with unspecified morphologic changes: Secondary | ICD-10-CM | POA: Diagnosis not present

## 2011-04-06 DIAGNOSIS — N186 End stage renal disease: Secondary | ICD-10-CM | POA: Diagnosis not present

## 2011-04-06 DIAGNOSIS — D509 Iron deficiency anemia, unspecified: Secondary | ICD-10-CM | POA: Diagnosis not present

## 2011-04-06 DIAGNOSIS — D472 Monoclonal gammopathy: Secondary | ICD-10-CM | POA: Diagnosis not present

## 2011-04-08 DIAGNOSIS — N186 End stage renal disease: Secondary | ICD-10-CM | POA: Diagnosis not present

## 2011-04-08 DIAGNOSIS — D509 Iron deficiency anemia, unspecified: Secondary | ICD-10-CM | POA: Diagnosis not present

## 2011-04-08 DIAGNOSIS — D472 Monoclonal gammopathy: Secondary | ICD-10-CM | POA: Diagnosis not present

## 2011-04-08 DIAGNOSIS — N2581 Secondary hyperparathyroidism of renal origin: Secondary | ICD-10-CM | POA: Diagnosis not present

## 2011-04-08 DIAGNOSIS — D631 Anemia in chronic kidney disease: Secondary | ICD-10-CM | POA: Diagnosis not present

## 2011-04-09 DIAGNOSIS — G894 Chronic pain syndrome: Secondary | ICD-10-CM | POA: Diagnosis not present

## 2011-04-11 DIAGNOSIS — N186 End stage renal disease: Secondary | ICD-10-CM | POA: Diagnosis not present

## 2011-04-11 DIAGNOSIS — D472 Monoclonal gammopathy: Secondary | ICD-10-CM | POA: Diagnosis not present

## 2011-04-11 DIAGNOSIS — N2581 Secondary hyperparathyroidism of renal origin: Secondary | ICD-10-CM | POA: Diagnosis not present

## 2011-04-13 DIAGNOSIS — I770 Arteriovenous fistula, acquired: Secondary | ICD-10-CM | POA: Diagnosis not present

## 2011-04-13 DIAGNOSIS — G4700 Insomnia, unspecified: Secondary | ICD-10-CM | POA: Diagnosis not present

## 2011-04-13 DIAGNOSIS — Z91040 Latex allergy status: Secondary | ICD-10-CM | POA: Diagnosis not present

## 2011-04-13 DIAGNOSIS — K219 Gastro-esophageal reflux disease without esophagitis: Secondary | ICD-10-CM | POA: Diagnosis not present

## 2011-04-13 DIAGNOSIS — F411 Generalized anxiety disorder: Secondary | ICD-10-CM | POA: Diagnosis not present

## 2011-04-13 DIAGNOSIS — T82898A Other specified complication of vascular prosthetic devices, implants and grafts, initial encounter: Secondary | ICD-10-CM | POA: Diagnosis not present

## 2011-04-13 DIAGNOSIS — N186 End stage renal disease: Secondary | ICD-10-CM | POA: Diagnosis not present

## 2011-04-13 DIAGNOSIS — J449 Chronic obstructive pulmonary disease, unspecified: Secondary | ICD-10-CM | POA: Diagnosis not present

## 2011-04-13 DIAGNOSIS — F329 Major depressive disorder, single episode, unspecified: Secondary | ICD-10-CM | POA: Diagnosis not present

## 2011-04-13 DIAGNOSIS — I120 Hypertensive chronic kidney disease with stage 5 chronic kidney disease or end stage renal disease: Secondary | ICD-10-CM | POA: Diagnosis not present

## 2011-04-13 DIAGNOSIS — Z79899 Other long term (current) drug therapy: Secondary | ICD-10-CM | POA: Diagnosis not present

## 2011-04-13 DIAGNOSIS — Z21 Asymptomatic human immunodeficiency virus [HIV] infection status: Secondary | ICD-10-CM | POA: Diagnosis not present

## 2011-04-13 DIAGNOSIS — D61818 Other pancytopenia: Secondary | ICD-10-CM | POA: Diagnosis not present

## 2011-04-13 DIAGNOSIS — Z880 Allergy status to penicillin: Secondary | ICD-10-CM | POA: Diagnosis not present

## 2011-04-14 DIAGNOSIS — N2581 Secondary hyperparathyroidism of renal origin: Secondary | ICD-10-CM | POA: Diagnosis not present

## 2011-04-14 DIAGNOSIS — D472 Monoclonal gammopathy: Secondary | ICD-10-CM | POA: Diagnosis not present

## 2011-04-14 DIAGNOSIS — N186 End stage renal disease: Secondary | ICD-10-CM | POA: Diagnosis not present

## 2011-04-15 DIAGNOSIS — D472 Monoclonal gammopathy: Secondary | ICD-10-CM | POA: Diagnosis not present

## 2011-04-15 DIAGNOSIS — D509 Iron deficiency anemia, unspecified: Secondary | ICD-10-CM | POA: Diagnosis not present

## 2011-04-15 DIAGNOSIS — N2581 Secondary hyperparathyroidism of renal origin: Secondary | ICD-10-CM | POA: Diagnosis not present

## 2011-04-15 DIAGNOSIS — N186 End stage renal disease: Secondary | ICD-10-CM | POA: Diagnosis not present

## 2011-04-15 DIAGNOSIS — D631 Anemia in chronic kidney disease: Secondary | ICD-10-CM | POA: Diagnosis not present

## 2011-04-17 DIAGNOSIS — D509 Iron deficiency anemia, unspecified: Secondary | ICD-10-CM | POA: Diagnosis not present

## 2011-04-17 DIAGNOSIS — D472 Monoclonal gammopathy: Secondary | ICD-10-CM | POA: Diagnosis not present

## 2011-04-17 DIAGNOSIS — N2581 Secondary hyperparathyroidism of renal origin: Secondary | ICD-10-CM | POA: Diagnosis not present

## 2011-04-17 DIAGNOSIS — N186 End stage renal disease: Secondary | ICD-10-CM | POA: Diagnosis not present

## 2011-04-17 DIAGNOSIS — D631 Anemia in chronic kidney disease: Secondary | ICD-10-CM | POA: Diagnosis not present

## 2011-04-20 DIAGNOSIS — N186 End stage renal disease: Secondary | ICD-10-CM | POA: Diagnosis not present

## 2011-04-20 DIAGNOSIS — N2581 Secondary hyperparathyroidism of renal origin: Secondary | ICD-10-CM | POA: Diagnosis not present

## 2011-04-20 DIAGNOSIS — N039 Chronic nephritic syndrome with unspecified morphologic changes: Secondary | ICD-10-CM | POA: Diagnosis not present

## 2011-04-20 DIAGNOSIS — D509 Iron deficiency anemia, unspecified: Secondary | ICD-10-CM | POA: Diagnosis not present

## 2011-04-20 DIAGNOSIS — D472 Monoclonal gammopathy: Secondary | ICD-10-CM | POA: Diagnosis not present

## 2011-04-21 DIAGNOSIS — N61 Mastitis without abscess: Secondary | ICD-10-CM | POA: Diagnosis not present

## 2011-04-22 DIAGNOSIS — D472 Monoclonal gammopathy: Secondary | ICD-10-CM | POA: Diagnosis not present

## 2011-04-22 DIAGNOSIS — N039 Chronic nephritic syndrome with unspecified morphologic changes: Secondary | ICD-10-CM | POA: Diagnosis not present

## 2011-04-22 DIAGNOSIS — N186 End stage renal disease: Secondary | ICD-10-CM | POA: Diagnosis not present

## 2011-04-22 DIAGNOSIS — N2581 Secondary hyperparathyroidism of renal origin: Secondary | ICD-10-CM | POA: Diagnosis not present

## 2011-04-22 DIAGNOSIS — D509 Iron deficiency anemia, unspecified: Secondary | ICD-10-CM | POA: Diagnosis not present

## 2011-04-24 DIAGNOSIS — N186 End stage renal disease: Secondary | ICD-10-CM | POA: Diagnosis not present

## 2011-04-24 DIAGNOSIS — D631 Anemia in chronic kidney disease: Secondary | ICD-10-CM | POA: Diagnosis not present

## 2011-04-24 DIAGNOSIS — N2581 Secondary hyperparathyroidism of renal origin: Secondary | ICD-10-CM | POA: Diagnosis not present

## 2011-04-24 DIAGNOSIS — D472 Monoclonal gammopathy: Secondary | ICD-10-CM | POA: Diagnosis not present

## 2011-04-24 DIAGNOSIS — D509 Iron deficiency anemia, unspecified: Secondary | ICD-10-CM | POA: Diagnosis not present

## 2011-04-27 DIAGNOSIS — N186 End stage renal disease: Secondary | ICD-10-CM | POA: Diagnosis not present

## 2011-04-27 DIAGNOSIS — N2581 Secondary hyperparathyroidism of renal origin: Secondary | ICD-10-CM | POA: Diagnosis not present

## 2011-04-27 DIAGNOSIS — D509 Iron deficiency anemia, unspecified: Secondary | ICD-10-CM | POA: Diagnosis not present

## 2011-04-27 DIAGNOSIS — N039 Chronic nephritic syndrome with unspecified morphologic changes: Secondary | ICD-10-CM | POA: Diagnosis not present

## 2011-04-27 DIAGNOSIS — D472 Monoclonal gammopathy: Secondary | ICD-10-CM | POA: Diagnosis not present

## 2011-04-29 ENCOUNTER — Encounter: Payer: Self-pay | Admitting: *Deleted

## 2011-04-29 ENCOUNTER — Telehealth: Payer: Self-pay | Admitting: *Deleted

## 2011-04-29 DIAGNOSIS — D509 Iron deficiency anemia, unspecified: Secondary | ICD-10-CM | POA: Diagnosis not present

## 2011-04-29 DIAGNOSIS — D472 Monoclonal gammopathy: Secondary | ICD-10-CM | POA: Diagnosis not present

## 2011-04-29 DIAGNOSIS — N186 End stage renal disease: Secondary | ICD-10-CM | POA: Diagnosis not present

## 2011-04-29 DIAGNOSIS — N039 Chronic nephritic syndrome with unspecified morphologic changes: Secondary | ICD-10-CM | POA: Diagnosis not present

## 2011-04-29 DIAGNOSIS — D631 Anemia in chronic kidney disease: Secondary | ICD-10-CM | POA: Diagnosis not present

## 2011-04-29 DIAGNOSIS — N2581 Secondary hyperparathyroidism of renal origin: Secondary | ICD-10-CM | POA: Diagnosis not present

## 2011-04-29 NOTE — Telephone Encounter (Signed)
Colletta [place] from [facility] facility called about this patient. She advised that at last visit they were told that the patient should have had labs prior to the visit. She said the patient has a visit coming up and just wanted to know which labs they should draw on the patient prior to her 05/21/11 visit. She gave the fax number as [phone]. She said they need them at least 1 week prior to visit for them to be resulted in time for the visit. Advised her will get back to her asap.  Spoke to the providers CMA and advised she needs the basic panel=CD4, Viral load, CBC, and CMP and a lipid if she has not had one in a year. Will call stephanie and let her know. And advise them to fax [place] the results when they get them back.

## 2011-04-30 DIAGNOSIS — N186 End stage renal disease: Secondary | ICD-10-CM | POA: Diagnosis not present

## 2011-05-01 DIAGNOSIS — R262 Difficulty in walking, not elsewhere classified: Secondary | ICD-10-CM | POA: Diagnosis not present

## 2011-05-01 DIAGNOSIS — D509 Iron deficiency anemia, unspecified: Secondary | ICD-10-CM | POA: Diagnosis not present

## 2011-05-01 DIAGNOSIS — N2581 Secondary hyperparathyroidism of renal origin: Secondary | ICD-10-CM | POA: Diagnosis not present

## 2011-05-01 DIAGNOSIS — M6281 Muscle weakness (generalized): Secondary | ICD-10-CM | POA: Diagnosis not present

## 2011-05-01 DIAGNOSIS — N039 Chronic nephritic syndrome with unspecified morphologic changes: Secondary | ICD-10-CM | POA: Diagnosis not present

## 2011-05-01 DIAGNOSIS — N186 End stage renal disease: Secondary | ICD-10-CM | POA: Diagnosis not present

## 2011-05-01 DIAGNOSIS — Z992 Dependence on renal dialysis: Secondary | ICD-10-CM | POA: Diagnosis not present

## 2011-05-01 DIAGNOSIS — D472 Monoclonal gammopathy: Secondary | ICD-10-CM | POA: Diagnosis not present

## 2011-05-03 DIAGNOSIS — R262 Difficulty in walking, not elsewhere classified: Secondary | ICD-10-CM | POA: Diagnosis not present

## 2011-05-03 DIAGNOSIS — M6281 Muscle weakness (generalized): Secondary | ICD-10-CM | POA: Diagnosis not present

## 2011-05-04 DIAGNOSIS — D510 Vitamin B12 deficiency anemia due to intrinsic factor deficiency: Secondary | ICD-10-CM | POA: Diagnosis not present

## 2011-05-04 DIAGNOSIS — N039 Chronic nephritic syndrome with unspecified morphologic changes: Secondary | ICD-10-CM | POA: Diagnosis not present

## 2011-05-04 DIAGNOSIS — N2581 Secondary hyperparathyroidism of renal origin: Secondary | ICD-10-CM | POA: Diagnosis not present

## 2011-05-04 DIAGNOSIS — Z992 Dependence on renal dialysis: Secondary | ICD-10-CM | POA: Diagnosis not present

## 2011-05-04 DIAGNOSIS — M6281 Muscle weakness (generalized): Secondary | ICD-10-CM | POA: Diagnosis not present

## 2011-05-04 DIAGNOSIS — N186 End stage renal disease: Secondary | ICD-10-CM | POA: Diagnosis not present

## 2011-05-04 DIAGNOSIS — B351 Tinea unguium: Secondary | ICD-10-CM | POA: Diagnosis not present

## 2011-05-04 DIAGNOSIS — R262 Difficulty in walking, not elsewhere classified: Secondary | ICD-10-CM | POA: Diagnosis not present

## 2011-05-04 DIAGNOSIS — M79609 Pain in unspecified limb: Secondary | ICD-10-CM | POA: Diagnosis not present

## 2011-05-04 DIAGNOSIS — D472 Monoclonal gammopathy: Secondary | ICD-10-CM | POA: Diagnosis not present

## 2011-05-04 DIAGNOSIS — D509 Iron deficiency anemia, unspecified: Secondary | ICD-10-CM | POA: Diagnosis not present

## 2011-05-05 ENCOUNTER — Ambulatory Visit: Payer: Medicare Other | Admitting: Infectious Diseases

## 2011-05-05 DIAGNOSIS — R262 Difficulty in walking, not elsewhere classified: Secondary | ICD-10-CM | POA: Diagnosis not present

## 2011-05-05 DIAGNOSIS — M6281 Muscle weakness (generalized): Secondary | ICD-10-CM | POA: Diagnosis not present

## 2011-05-06 DIAGNOSIS — D509 Iron deficiency anemia, unspecified: Secondary | ICD-10-CM | POA: Diagnosis not present

## 2011-05-06 DIAGNOSIS — N2581 Secondary hyperparathyroidism of renal origin: Secondary | ICD-10-CM | POA: Diagnosis not present

## 2011-05-06 DIAGNOSIS — D472 Monoclonal gammopathy: Secondary | ICD-10-CM | POA: Diagnosis not present

## 2011-05-06 DIAGNOSIS — Z992 Dependence on renal dialysis: Secondary | ICD-10-CM | POA: Diagnosis not present

## 2011-05-06 DIAGNOSIS — N186 End stage renal disease: Secondary | ICD-10-CM | POA: Diagnosis not present

## 2011-05-06 DIAGNOSIS — D631 Anemia in chronic kidney disease: Secondary | ICD-10-CM | POA: Diagnosis not present

## 2011-05-07 ENCOUNTER — Ambulatory Visit: Payer: Medicare Other | Admitting: Infectious Diseases

## 2011-05-08 DIAGNOSIS — B20 Human immunodeficiency virus [HIV] disease: Secondary | ICD-10-CM | POA: Diagnosis not present

## 2011-05-08 DIAGNOSIS — N039 Chronic nephritic syndrome with unspecified morphologic changes: Secondary | ICD-10-CM | POA: Diagnosis not present

## 2011-05-08 DIAGNOSIS — D472 Monoclonal gammopathy: Secondary | ICD-10-CM | POA: Diagnosis not present

## 2011-05-08 DIAGNOSIS — D509 Iron deficiency anemia, unspecified: Secondary | ICD-10-CM | POA: Diagnosis not present

## 2011-05-08 DIAGNOSIS — N2581 Secondary hyperparathyroidism of renal origin: Secondary | ICD-10-CM | POA: Diagnosis not present

## 2011-05-08 DIAGNOSIS — G894 Chronic pain syndrome: Secondary | ICD-10-CM | POA: Diagnosis not present

## 2011-05-08 DIAGNOSIS — Z992 Dependence on renal dialysis: Secondary | ICD-10-CM | POA: Diagnosis not present

## 2011-05-08 DIAGNOSIS — N186 End stage renal disease: Secondary | ICD-10-CM | POA: Diagnosis not present

## 2011-05-11 DIAGNOSIS — N186 End stage renal disease: Secondary | ICD-10-CM | POA: Diagnosis not present

## 2011-05-11 DIAGNOSIS — N2581 Secondary hyperparathyroidism of renal origin: Secondary | ICD-10-CM | POA: Diagnosis not present

## 2011-05-11 DIAGNOSIS — D631 Anemia in chronic kidney disease: Secondary | ICD-10-CM | POA: Diagnosis not present

## 2011-05-11 DIAGNOSIS — D509 Iron deficiency anemia, unspecified: Secondary | ICD-10-CM | POA: Diagnosis not present

## 2011-05-11 DIAGNOSIS — D472 Monoclonal gammopathy: Secondary | ICD-10-CM | POA: Diagnosis not present

## 2011-05-11 DIAGNOSIS — Z992 Dependence on renal dialysis: Secondary | ICD-10-CM | POA: Diagnosis not present

## 2011-05-13 DIAGNOSIS — Z992 Dependence on renal dialysis: Secondary | ICD-10-CM | POA: Diagnosis not present

## 2011-05-13 DIAGNOSIS — D509 Iron deficiency anemia, unspecified: Secondary | ICD-10-CM | POA: Diagnosis not present

## 2011-05-13 DIAGNOSIS — N039 Chronic nephritic syndrome with unspecified morphologic changes: Secondary | ICD-10-CM | POA: Diagnosis not present

## 2011-05-13 DIAGNOSIS — N186 End stage renal disease: Secondary | ICD-10-CM | POA: Diagnosis not present

## 2011-05-13 DIAGNOSIS — N2581 Secondary hyperparathyroidism of renal origin: Secondary | ICD-10-CM | POA: Diagnosis not present

## 2011-05-13 DIAGNOSIS — B20 Human immunodeficiency virus [HIV] disease: Secondary | ICD-10-CM | POA: Diagnosis not present

## 2011-05-13 DIAGNOSIS — D472 Monoclonal gammopathy: Secondary | ICD-10-CM | POA: Diagnosis not present

## 2011-05-14 ENCOUNTER — Ambulatory Visit: Payer: Medicare Other | Admitting: Infectious Diseases

## 2011-05-14 DIAGNOSIS — B20 Human immunodeficiency virus [HIV] disease: Secondary | ICD-10-CM | POA: Diagnosis not present

## 2011-05-15 DIAGNOSIS — N2581 Secondary hyperparathyroidism of renal origin: Secondary | ICD-10-CM | POA: Diagnosis not present

## 2011-05-15 DIAGNOSIS — D509 Iron deficiency anemia, unspecified: Secondary | ICD-10-CM | POA: Diagnosis not present

## 2011-05-15 DIAGNOSIS — Z992 Dependence on renal dialysis: Secondary | ICD-10-CM | POA: Diagnosis not present

## 2011-05-15 DIAGNOSIS — N039 Chronic nephritic syndrome with unspecified morphologic changes: Secondary | ICD-10-CM | POA: Diagnosis not present

## 2011-05-15 DIAGNOSIS — D472 Monoclonal gammopathy: Secondary | ICD-10-CM | POA: Diagnosis not present

## 2011-05-15 DIAGNOSIS — N186 End stage renal disease: Secondary | ICD-10-CM | POA: Diagnosis not present

## 2011-05-18 DIAGNOSIS — D472 Monoclonal gammopathy: Secondary | ICD-10-CM | POA: Diagnosis not present

## 2011-05-18 DIAGNOSIS — N039 Chronic nephritic syndrome with unspecified morphologic changes: Secondary | ICD-10-CM | POA: Diagnosis not present

## 2011-05-18 DIAGNOSIS — N186 End stage renal disease: Secondary | ICD-10-CM | POA: Diagnosis not present

## 2011-05-18 DIAGNOSIS — D509 Iron deficiency anemia, unspecified: Secondary | ICD-10-CM | POA: Diagnosis not present

## 2011-05-18 DIAGNOSIS — N2581 Secondary hyperparathyroidism of renal origin: Secondary | ICD-10-CM | POA: Diagnosis not present

## 2011-05-18 DIAGNOSIS — Z992 Dependence on renal dialysis: Secondary | ICD-10-CM | POA: Diagnosis not present

## 2011-05-20 DIAGNOSIS — D472 Monoclonal gammopathy: Secondary | ICD-10-CM | POA: Diagnosis not present

## 2011-05-20 DIAGNOSIS — N2581 Secondary hyperparathyroidism of renal origin: Secondary | ICD-10-CM | POA: Diagnosis not present

## 2011-05-20 DIAGNOSIS — D631 Anemia in chronic kidney disease: Secondary | ICD-10-CM | POA: Diagnosis not present

## 2011-05-20 DIAGNOSIS — N186 End stage renal disease: Secondary | ICD-10-CM | POA: Diagnosis not present

## 2011-05-20 DIAGNOSIS — Z992 Dependence on renal dialysis: Secondary | ICD-10-CM | POA: Diagnosis not present

## 2011-05-20 DIAGNOSIS — D509 Iron deficiency anemia, unspecified: Secondary | ICD-10-CM | POA: Diagnosis not present

## 2011-05-21 ENCOUNTER — Encounter: Payer: Self-pay | Admitting: Infectious Diseases

## 2011-05-21 ENCOUNTER — Ambulatory Visit (INDEPENDENT_AMBULATORY_CARE_PROVIDER_SITE_OTHER): Payer: Medicare Other | Admitting: Infectious Diseases

## 2011-05-21 VITALS — BP 170/106 | HR 76 | Temp 98.2°F

## 2011-05-21 DIAGNOSIS — B20 Human immunodeficiency virus [HIV] disease: Secondary | ICD-10-CM

## 2011-05-21 DIAGNOSIS — N61 Mastitis without abscess: Secondary | ICD-10-CM | POA: Diagnosis not present

## 2011-05-21 DIAGNOSIS — N186 End stage renal disease: Secondary | ICD-10-CM | POA: Diagnosis not present

## 2011-05-21 DIAGNOSIS — I10 Essential (primary) hypertension: Secondary | ICD-10-CM | POA: Diagnosis not present

## 2011-05-21 DIAGNOSIS — Z79899 Other long term (current) drug therapy: Secondary | ICD-10-CM | POA: Diagnosis not present

## 2011-05-21 DIAGNOSIS — Z113 Encounter for screening for infections with a predominantly sexual mode of transmission: Secondary | ICD-10-CM | POA: Diagnosis not present

## 2011-05-21 LAB — LIPID PANEL
HDL: 42 mg/dL (ref >39)
LDL Cholesterol: 80 mg/dL (ref 0–99)
Total CHOL/HDL Ratio: 3.2 Ratio
VLDL: 12 mg/dL (ref 0–40)

## 2011-05-21 NOTE — Patient Instructions (Signed)
Please obtain your MD in [place] so that we can call them to confirm your recent medical care.

## 2011-05-21 NOTE — Assessment & Plan Note (Signed)
Appreciate partnering with her nephorologist. Her avg looks good.

## 2011-05-21 NOTE — Progress Notes (Signed)
  Subjective:    Patient ID: [name], female    DOB: 23-Feb-1951, 61 y.o.   MRN: [id number]  HPI 61 yo F with hx of ESRD, HIV+ and admission to [facility] on 03-09-11 with R breast pain and swelling. She was treated as cellulitis, with vancomycin. She had a mammogram that was read as BiRADS-4 due to concern about an underlying mass. She was changed to levaquin and d/c home. Did not have a Bx. Saw a doctor in [place] and was told she did not need a Bx yet.   Had CD4 262 and HIV RNA not detected. Taking AZT/3TC/ISN, dosed for her ESRD. Taking sildenafil for ?     Review of Systems  Constitutional: Negative for fever and chills.       No breast pain or nipple d/c.   Gastrointestinal: Positive for constipation. Negative for diarrhea.  Genitourinary:       Anuric  Skin:       No problems with HD access site. States she had surgery on this last month for new access.   Neurological: Positive for headaches.       Headaches are rare, mild.        Objective:   Physical Exam  Constitutional: She appears well-developed and well-nourished.  HENT:  Mouth/Throat: No oropharyngeal exudate.  Eyes: EOM are normal. Pupils are equal, round, and reactive to light.  Neck: Neck supple.  Cardiovascular: Normal rate, regular rhythm and normal heart sounds.   Pulmonary/Chest: Effort normal and breath sounds normal. Right breast exhibits no inverted nipple, no nipple discharge, no skin change and no tenderness.    Abdominal: Soft. Bowel sounds are normal. She exhibits no distension. There is no tenderness.  Musculoskeletal: She exhibits no edema.       Arms: Lymphadenopathy:    She has no cervical adenopathy.          Assessment & Plan:

## 2011-05-21 NOTE — Assessment & Plan Note (Signed)
To follow up with PCP

## 2011-05-21 NOTE — Assessment & Plan Note (Signed)
Her flu and pnvx are up to date. Her CD4 is ok. I am not sure about her Vl as the test method is different than ours. Will repeat in our lab. Will also check her RPR and her lipid panel. See her back in 6 months.

## 2011-05-21 NOTE — Assessment & Plan Note (Signed)
This is very concerning. Will get records of her eval, have her seen by surgery if not yet done.

## 2011-05-22 DIAGNOSIS — D631 Anemia in chronic kidney disease: Secondary | ICD-10-CM | POA: Diagnosis not present

## 2011-05-22 DIAGNOSIS — D472 Monoclonal gammopathy: Secondary | ICD-10-CM | POA: Diagnosis not present

## 2011-05-22 DIAGNOSIS — D509 Iron deficiency anemia, unspecified: Secondary | ICD-10-CM | POA: Diagnosis not present

## 2011-05-22 DIAGNOSIS — N186 End stage renal disease: Secondary | ICD-10-CM | POA: Diagnosis not present

## 2011-05-22 DIAGNOSIS — N2581 Secondary hyperparathyroidism of renal origin: Secondary | ICD-10-CM | POA: Diagnosis not present

## 2011-05-22 DIAGNOSIS — Z992 Dependence on renal dialysis: Secondary | ICD-10-CM | POA: Diagnosis not present

## 2011-05-22 LAB — RPR

## 2011-05-25 DIAGNOSIS — D472 Monoclonal gammopathy: Secondary | ICD-10-CM | POA: Diagnosis not present

## 2011-05-25 DIAGNOSIS — N2581 Secondary hyperparathyroidism of renal origin: Secondary | ICD-10-CM | POA: Diagnosis not present

## 2011-05-25 DIAGNOSIS — N039 Chronic nephritic syndrome with unspecified morphologic changes: Secondary | ICD-10-CM | POA: Diagnosis not present

## 2011-05-25 DIAGNOSIS — N186 End stage renal disease: Secondary | ICD-10-CM | POA: Diagnosis not present

## 2011-05-25 DIAGNOSIS — Z992 Dependence on renal dialysis: Secondary | ICD-10-CM | POA: Diagnosis not present

## 2011-05-25 DIAGNOSIS — D509 Iron deficiency anemia, unspecified: Secondary | ICD-10-CM | POA: Diagnosis not present

## 2011-05-27 DIAGNOSIS — D472 Monoclonal gammopathy: Secondary | ICD-10-CM | POA: Diagnosis not present

## 2011-05-27 DIAGNOSIS — N2581 Secondary hyperparathyroidism of renal origin: Secondary | ICD-10-CM | POA: Diagnosis not present

## 2011-05-27 DIAGNOSIS — D509 Iron deficiency anemia, unspecified: Secondary | ICD-10-CM | POA: Diagnosis not present

## 2011-05-27 DIAGNOSIS — Z21 Asymptomatic human immunodeficiency virus [HIV] infection status: Secondary | ICD-10-CM | POA: Diagnosis not present

## 2011-05-27 DIAGNOSIS — Z992 Dependence on renal dialysis: Secondary | ICD-10-CM | POA: Diagnosis not present

## 2011-05-27 DIAGNOSIS — N039 Chronic nephritic syndrome with unspecified morphologic changes: Secondary | ICD-10-CM | POA: Diagnosis not present

## 2011-05-27 DIAGNOSIS — D631 Anemia in chronic kidney disease: Secondary | ICD-10-CM | POA: Diagnosis not present

## 2011-05-27 DIAGNOSIS — N186 End stage renal disease: Secondary | ICD-10-CM | POA: Diagnosis not present

## 2011-05-29 DIAGNOSIS — Z992 Dependence on renal dialysis: Secondary | ICD-10-CM | POA: Diagnosis not present

## 2011-05-29 DIAGNOSIS — D509 Iron deficiency anemia, unspecified: Secondary | ICD-10-CM | POA: Diagnosis not present

## 2011-05-29 DIAGNOSIS — D472 Monoclonal gammopathy: Secondary | ICD-10-CM | POA: Diagnosis not present

## 2011-05-29 DIAGNOSIS — N186 End stage renal disease: Secondary | ICD-10-CM | POA: Diagnosis not present

## 2011-05-29 DIAGNOSIS — N2581 Secondary hyperparathyroidism of renal origin: Secondary | ICD-10-CM | POA: Diagnosis not present

## 2011-05-29 DIAGNOSIS — N039 Chronic nephritic syndrome with unspecified morphologic changes: Secondary | ICD-10-CM | POA: Diagnosis not present

## 2011-05-31 DIAGNOSIS — N186 End stage renal disease: Secondary | ICD-10-CM | POA: Diagnosis not present

## 2011-06-01 DIAGNOSIS — N186 End stage renal disease: Secondary | ICD-10-CM | POA: Diagnosis not present

## 2011-06-01 DIAGNOSIS — D472 Monoclonal gammopathy: Secondary | ICD-10-CM | POA: Diagnosis not present

## 2011-06-01 DIAGNOSIS — D509 Iron deficiency anemia, unspecified: Secondary | ICD-10-CM | POA: Diagnosis not present

## 2011-06-01 DIAGNOSIS — N039 Chronic nephritic syndrome with unspecified morphologic changes: Secondary | ICD-10-CM | POA: Diagnosis not present

## 2011-06-01 DIAGNOSIS — D631 Anemia in chronic kidney disease: Secondary | ICD-10-CM | POA: Diagnosis not present

## 2011-06-01 DIAGNOSIS — N2581 Secondary hyperparathyroidism of renal origin: Secondary | ICD-10-CM | POA: Diagnosis not present

## 2011-06-03 DIAGNOSIS — D509 Iron deficiency anemia, unspecified: Secondary | ICD-10-CM | POA: Diagnosis not present

## 2011-06-03 DIAGNOSIS — D472 Monoclonal gammopathy: Secondary | ICD-10-CM | POA: Diagnosis not present

## 2011-06-03 DIAGNOSIS — D631 Anemia in chronic kidney disease: Secondary | ICD-10-CM | POA: Diagnosis not present

## 2011-06-03 DIAGNOSIS — N2581 Secondary hyperparathyroidism of renal origin: Secondary | ICD-10-CM | POA: Diagnosis not present

## 2011-06-03 DIAGNOSIS — N186 End stage renal disease: Secondary | ICD-10-CM | POA: Diagnosis not present

## 2011-06-05 DIAGNOSIS — N186 End stage renal disease: Secondary | ICD-10-CM | POA: Diagnosis not present

## 2011-06-05 DIAGNOSIS — D509 Iron deficiency anemia, unspecified: Secondary | ICD-10-CM | POA: Diagnosis not present

## 2011-06-05 DIAGNOSIS — N2581 Secondary hyperparathyroidism of renal origin: Secondary | ICD-10-CM | POA: Diagnosis not present

## 2011-06-05 DIAGNOSIS — N039 Chronic nephritic syndrome with unspecified morphologic changes: Secondary | ICD-10-CM | POA: Diagnosis not present

## 2011-06-05 DIAGNOSIS — D472 Monoclonal gammopathy: Secondary | ICD-10-CM | POA: Diagnosis not present

## 2011-06-08 DIAGNOSIS — D631 Anemia in chronic kidney disease: Secondary | ICD-10-CM | POA: Diagnosis not present

## 2011-06-08 DIAGNOSIS — N2581 Secondary hyperparathyroidism of renal origin: Secondary | ICD-10-CM | POA: Diagnosis not present

## 2011-06-08 DIAGNOSIS — D472 Monoclonal gammopathy: Secondary | ICD-10-CM | POA: Diagnosis not present

## 2011-06-08 DIAGNOSIS — N039 Chronic nephritic syndrome with unspecified morphologic changes: Secondary | ICD-10-CM | POA: Diagnosis not present

## 2011-06-08 DIAGNOSIS — D509 Iron deficiency anemia, unspecified: Secondary | ICD-10-CM | POA: Diagnosis not present

## 2011-06-08 DIAGNOSIS — N186 End stage renal disease: Secondary | ICD-10-CM | POA: Diagnosis not present

## 2011-06-10 DIAGNOSIS — D509 Iron deficiency anemia, unspecified: Secondary | ICD-10-CM | POA: Diagnosis not present

## 2011-06-10 DIAGNOSIS — D649 Anemia, unspecified: Secondary | ICD-10-CM | POA: Diagnosis not present

## 2011-06-10 DIAGNOSIS — N2581 Secondary hyperparathyroidism of renal origin: Secondary | ICD-10-CM | POA: Diagnosis not present

## 2011-06-10 DIAGNOSIS — D631 Anemia in chronic kidney disease: Secondary | ICD-10-CM | POA: Diagnosis not present

## 2011-06-10 DIAGNOSIS — D472 Monoclonal gammopathy: Secondary | ICD-10-CM | POA: Diagnosis not present

## 2011-06-10 DIAGNOSIS — N186 End stage renal disease: Secondary | ICD-10-CM | POA: Diagnosis not present

## 2011-06-11 DIAGNOSIS — G894 Chronic pain syndrome: Secondary | ICD-10-CM | POA: Diagnosis not present

## 2011-06-12 DIAGNOSIS — D472 Monoclonal gammopathy: Secondary | ICD-10-CM | POA: Diagnosis not present

## 2011-06-12 DIAGNOSIS — N2581 Secondary hyperparathyroidism of renal origin: Secondary | ICD-10-CM | POA: Diagnosis not present

## 2011-06-12 DIAGNOSIS — N186 End stage renal disease: Secondary | ICD-10-CM | POA: Diagnosis not present

## 2011-06-12 DIAGNOSIS — D509 Iron deficiency anemia, unspecified: Secondary | ICD-10-CM | POA: Diagnosis not present

## 2011-06-12 DIAGNOSIS — D631 Anemia in chronic kidney disease: Secondary | ICD-10-CM | POA: Diagnosis not present

## 2011-06-15 DIAGNOSIS — D509 Iron deficiency anemia, unspecified: Secondary | ICD-10-CM | POA: Diagnosis not present

## 2011-06-15 DIAGNOSIS — N2581 Secondary hyperparathyroidism of renal origin: Secondary | ICD-10-CM | POA: Diagnosis not present

## 2011-06-15 DIAGNOSIS — D631 Anemia in chronic kidney disease: Secondary | ICD-10-CM | POA: Diagnosis not present

## 2011-06-15 DIAGNOSIS — D472 Monoclonal gammopathy: Secondary | ICD-10-CM | POA: Diagnosis not present

## 2011-06-15 DIAGNOSIS — N186 End stage renal disease: Secondary | ICD-10-CM | POA: Diagnosis not present

## 2011-06-17 DIAGNOSIS — N2581 Secondary hyperparathyroidism of renal origin: Secondary | ICD-10-CM | POA: Diagnosis not present

## 2011-06-17 DIAGNOSIS — D509 Iron deficiency anemia, unspecified: Secondary | ICD-10-CM | POA: Diagnosis not present

## 2011-06-17 DIAGNOSIS — D631 Anemia in chronic kidney disease: Secondary | ICD-10-CM | POA: Diagnosis not present

## 2011-06-17 DIAGNOSIS — N186 End stage renal disease: Secondary | ICD-10-CM | POA: Diagnosis not present

## 2011-06-17 DIAGNOSIS — D472 Monoclonal gammopathy: Secondary | ICD-10-CM | POA: Diagnosis not present

## 2011-06-22 DIAGNOSIS — D509 Iron deficiency anemia, unspecified: Secondary | ICD-10-CM | POA: Diagnosis not present

## 2011-06-22 DIAGNOSIS — N2581 Secondary hyperparathyroidism of renal origin: Secondary | ICD-10-CM | POA: Diagnosis not present

## 2011-06-22 DIAGNOSIS — N186 End stage renal disease: Secondary | ICD-10-CM | POA: Diagnosis not present

## 2011-06-22 DIAGNOSIS — N039 Chronic nephritic syndrome with unspecified morphologic changes: Secondary | ICD-10-CM | POA: Diagnosis not present

## 2011-06-22 DIAGNOSIS — D472 Monoclonal gammopathy: Secondary | ICD-10-CM | POA: Diagnosis not present

## 2011-06-24 DIAGNOSIS — D509 Iron deficiency anemia, unspecified: Secondary | ICD-10-CM | POA: Diagnosis not present

## 2011-06-24 DIAGNOSIS — N186 End stage renal disease: Secondary | ICD-10-CM | POA: Diagnosis not present

## 2011-06-24 DIAGNOSIS — N2581 Secondary hyperparathyroidism of renal origin: Secondary | ICD-10-CM | POA: Diagnosis not present

## 2011-06-24 DIAGNOSIS — N039 Chronic nephritic syndrome with unspecified morphologic changes: Secondary | ICD-10-CM | POA: Diagnosis not present

## 2011-06-24 DIAGNOSIS — D472 Monoclonal gammopathy: Secondary | ICD-10-CM | POA: Diagnosis not present

## 2011-06-24 DIAGNOSIS — IMO0001 Reserved for inherently not codable concepts without codable children: Secondary | ICD-10-CM | POA: Diagnosis not present

## 2011-06-26 DIAGNOSIS — D509 Iron deficiency anemia, unspecified: Secondary | ICD-10-CM | POA: Diagnosis not present

## 2011-06-26 DIAGNOSIS — N2581 Secondary hyperparathyroidism of renal origin: Secondary | ICD-10-CM | POA: Diagnosis not present

## 2011-06-26 DIAGNOSIS — N186 End stage renal disease: Secondary | ICD-10-CM | POA: Diagnosis not present

## 2011-06-26 DIAGNOSIS — D472 Monoclonal gammopathy: Secondary | ICD-10-CM | POA: Diagnosis not present

## 2011-06-26 DIAGNOSIS — N039 Chronic nephritic syndrome with unspecified morphologic changes: Secondary | ICD-10-CM | POA: Diagnosis not present

## 2011-06-29 DIAGNOSIS — D509 Iron deficiency anemia, unspecified: Secondary | ICD-10-CM | POA: Diagnosis not present

## 2011-06-29 DIAGNOSIS — N2581 Secondary hyperparathyroidism of renal origin: Secondary | ICD-10-CM | POA: Diagnosis not present

## 2011-06-29 DIAGNOSIS — D631 Anemia in chronic kidney disease: Secondary | ICD-10-CM | POA: Diagnosis not present

## 2011-06-29 DIAGNOSIS — D472 Monoclonal gammopathy: Secondary | ICD-10-CM | POA: Diagnosis not present

## 2011-06-29 DIAGNOSIS — N186 End stage renal disease: Secondary | ICD-10-CM | POA: Diagnosis not present

## 2011-06-30 DIAGNOSIS — N186 End stage renal disease: Secondary | ICD-10-CM | POA: Diagnosis not present

## 2011-07-01 DIAGNOSIS — D472 Monoclonal gammopathy: Secondary | ICD-10-CM | POA: Diagnosis not present

## 2011-07-01 DIAGNOSIS — D509 Iron deficiency anemia, unspecified: Secondary | ICD-10-CM | POA: Diagnosis not present

## 2011-07-01 DIAGNOSIS — N039 Chronic nephritic syndrome with unspecified morphologic changes: Secondary | ICD-10-CM | POA: Diagnosis not present

## 2011-07-01 DIAGNOSIS — N186 End stage renal disease: Secondary | ICD-10-CM | POA: Diagnosis not present

## 2011-07-01 DIAGNOSIS — D631 Anemia in chronic kidney disease: Secondary | ICD-10-CM | POA: Diagnosis not present

## 2011-07-01 DIAGNOSIS — N2581 Secondary hyperparathyroidism of renal origin: Secondary | ICD-10-CM | POA: Diagnosis not present

## 2011-07-03 DIAGNOSIS — D631 Anemia in chronic kidney disease: Secondary | ICD-10-CM | POA: Diagnosis not present

## 2011-07-03 DIAGNOSIS — D472 Monoclonal gammopathy: Secondary | ICD-10-CM | POA: Diagnosis not present

## 2011-07-03 DIAGNOSIS — N039 Chronic nephritic syndrome with unspecified morphologic changes: Secondary | ICD-10-CM | POA: Diagnosis not present

## 2011-07-03 DIAGNOSIS — D509 Iron deficiency anemia, unspecified: Secondary | ICD-10-CM | POA: Diagnosis not present

## 2011-07-03 DIAGNOSIS — N186 End stage renal disease: Secondary | ICD-10-CM | POA: Diagnosis not present

## 2011-07-03 DIAGNOSIS — N2581 Secondary hyperparathyroidism of renal origin: Secondary | ICD-10-CM | POA: Diagnosis not present

## 2011-07-06 DIAGNOSIS — D631 Anemia in chronic kidney disease: Secondary | ICD-10-CM | POA: Diagnosis not present

## 2011-07-06 DIAGNOSIS — N186 End stage renal disease: Secondary | ICD-10-CM | POA: Diagnosis not present

## 2011-07-06 DIAGNOSIS — N2581 Secondary hyperparathyroidism of renal origin: Secondary | ICD-10-CM | POA: Diagnosis not present

## 2011-07-06 DIAGNOSIS — D472 Monoclonal gammopathy: Secondary | ICD-10-CM | POA: Diagnosis not present

## 2011-07-06 DIAGNOSIS — D509 Iron deficiency anemia, unspecified: Secondary | ICD-10-CM | POA: Diagnosis not present

## 2011-07-08 DIAGNOSIS — N039 Chronic nephritic syndrome with unspecified morphologic changes: Secondary | ICD-10-CM | POA: Diagnosis not present

## 2011-07-08 DIAGNOSIS — D472 Monoclonal gammopathy: Secondary | ICD-10-CM | POA: Diagnosis not present

## 2011-07-08 DIAGNOSIS — N2581 Secondary hyperparathyroidism of renal origin: Secondary | ICD-10-CM | POA: Diagnosis not present

## 2011-07-08 DIAGNOSIS — N186 End stage renal disease: Secondary | ICD-10-CM | POA: Diagnosis not present

## 2011-07-08 DIAGNOSIS — D509 Iron deficiency anemia, unspecified: Secondary | ICD-10-CM | POA: Diagnosis not present

## 2011-07-09 DIAGNOSIS — G894 Chronic pain syndrome: Secondary | ICD-10-CM | POA: Diagnosis not present

## 2011-07-10 DIAGNOSIS — N2581 Secondary hyperparathyroidism of renal origin: Secondary | ICD-10-CM | POA: Diagnosis not present

## 2011-07-10 DIAGNOSIS — D631 Anemia in chronic kidney disease: Secondary | ICD-10-CM | POA: Diagnosis not present

## 2011-07-10 DIAGNOSIS — N186 End stage renal disease: Secondary | ICD-10-CM | POA: Diagnosis not present

## 2011-07-10 DIAGNOSIS — D509 Iron deficiency anemia, unspecified: Secondary | ICD-10-CM | POA: Diagnosis not present

## 2011-07-10 DIAGNOSIS — D472 Monoclonal gammopathy: Secondary | ICD-10-CM | POA: Diagnosis not present

## 2011-07-13 DIAGNOSIS — N2581 Secondary hyperparathyroidism of renal origin: Secondary | ICD-10-CM | POA: Diagnosis not present

## 2011-07-13 DIAGNOSIS — N039 Chronic nephritic syndrome with unspecified morphologic changes: Secondary | ICD-10-CM | POA: Diagnosis not present

## 2011-07-13 DIAGNOSIS — D509 Iron deficiency anemia, unspecified: Secondary | ICD-10-CM | POA: Diagnosis not present

## 2011-07-13 DIAGNOSIS — N186 End stage renal disease: Secondary | ICD-10-CM | POA: Diagnosis not present

## 2011-07-13 DIAGNOSIS — D472 Monoclonal gammopathy: Secondary | ICD-10-CM | POA: Diagnosis not present

## 2011-07-16 DIAGNOSIS — N186 End stage renal disease: Secondary | ICD-10-CM | POA: Diagnosis not present

## 2011-07-16 DIAGNOSIS — Z992 Dependence on renal dialysis: Secondary | ICD-10-CM | POA: Diagnosis not present

## 2011-07-16 DIAGNOSIS — N2581 Secondary hyperparathyroidism of renal origin: Secondary | ICD-10-CM | POA: Diagnosis not present

## 2011-07-16 DIAGNOSIS — D472 Monoclonal gammopathy: Secondary | ICD-10-CM | POA: Diagnosis not present

## 2011-07-16 DIAGNOSIS — D509 Iron deficiency anemia, unspecified: Secondary | ICD-10-CM | POA: Diagnosis not present

## 2011-07-16 DIAGNOSIS — N039 Chronic nephritic syndrome with unspecified morphologic changes: Secondary | ICD-10-CM | POA: Diagnosis not present

## 2011-07-18 DIAGNOSIS — Z992 Dependence on renal dialysis: Secondary | ICD-10-CM | POA: Diagnosis not present

## 2011-07-18 DIAGNOSIS — D472 Monoclonal gammopathy: Secondary | ICD-10-CM | POA: Diagnosis not present

## 2011-07-18 DIAGNOSIS — D509 Iron deficiency anemia, unspecified: Secondary | ICD-10-CM | POA: Diagnosis not present

## 2011-07-18 DIAGNOSIS — N039 Chronic nephritic syndrome with unspecified morphologic changes: Secondary | ICD-10-CM | POA: Diagnosis not present

## 2011-07-18 DIAGNOSIS — N2581 Secondary hyperparathyroidism of renal origin: Secondary | ICD-10-CM | POA: Diagnosis not present

## 2011-07-18 DIAGNOSIS — N186 End stage renal disease: Secondary | ICD-10-CM | POA: Diagnosis not present

## 2011-07-21 DIAGNOSIS — D631 Anemia in chronic kidney disease: Secondary | ICD-10-CM | POA: Diagnosis not present

## 2011-07-21 DIAGNOSIS — N2581 Secondary hyperparathyroidism of renal origin: Secondary | ICD-10-CM | POA: Diagnosis not present

## 2011-07-21 DIAGNOSIS — N039 Chronic nephritic syndrome with unspecified morphologic changes: Secondary | ICD-10-CM | POA: Diagnosis not present

## 2011-07-21 DIAGNOSIS — N186 End stage renal disease: Secondary | ICD-10-CM | POA: Diagnosis not present

## 2011-07-21 DIAGNOSIS — D472 Monoclonal gammopathy: Secondary | ICD-10-CM | POA: Diagnosis not present

## 2011-07-21 DIAGNOSIS — Z992 Dependence on renal dialysis: Secondary | ICD-10-CM | POA: Diagnosis not present

## 2011-07-21 DIAGNOSIS — D509 Iron deficiency anemia, unspecified: Secondary | ICD-10-CM | POA: Diagnosis not present

## 2011-07-22 DIAGNOSIS — D649 Anemia, unspecified: Secondary | ICD-10-CM | POA: Diagnosis not present

## 2011-07-22 DIAGNOSIS — D509 Iron deficiency anemia, unspecified: Secondary | ICD-10-CM | POA: Diagnosis not present

## 2011-07-23 DIAGNOSIS — Z992 Dependence on renal dialysis: Secondary | ICD-10-CM | POA: Diagnosis not present

## 2011-07-23 DIAGNOSIS — D509 Iron deficiency anemia, unspecified: Secondary | ICD-10-CM | POA: Diagnosis not present

## 2011-07-23 DIAGNOSIS — D631 Anemia in chronic kidney disease: Secondary | ICD-10-CM | POA: Diagnosis not present

## 2011-07-23 DIAGNOSIS — N2581 Secondary hyperparathyroidism of renal origin: Secondary | ICD-10-CM | POA: Diagnosis not present

## 2011-07-23 DIAGNOSIS — N186 End stage renal disease: Secondary | ICD-10-CM | POA: Diagnosis not present

## 2011-07-23 DIAGNOSIS — D472 Monoclonal gammopathy: Secondary | ICD-10-CM | POA: Diagnosis not present

## 2011-07-25 DIAGNOSIS — N186 End stage renal disease: Secondary | ICD-10-CM | POA: Diagnosis not present

## 2011-07-25 DIAGNOSIS — N2581 Secondary hyperparathyroidism of renal origin: Secondary | ICD-10-CM | POA: Diagnosis not present

## 2011-07-25 DIAGNOSIS — N039 Chronic nephritic syndrome with unspecified morphologic changes: Secondary | ICD-10-CM | POA: Diagnosis not present

## 2011-07-25 DIAGNOSIS — D472 Monoclonal gammopathy: Secondary | ICD-10-CM | POA: Diagnosis not present

## 2011-07-25 DIAGNOSIS — Z992 Dependence on renal dialysis: Secondary | ICD-10-CM | POA: Diagnosis not present

## 2011-07-25 DIAGNOSIS — D509 Iron deficiency anemia, unspecified: Secondary | ICD-10-CM | POA: Diagnosis not present

## 2011-07-28 DIAGNOSIS — N186 End stage renal disease: Secondary | ICD-10-CM | POA: Diagnosis not present

## 2011-07-28 DIAGNOSIS — D472 Monoclonal gammopathy: Secondary | ICD-10-CM | POA: Diagnosis not present

## 2011-07-28 DIAGNOSIS — D509 Iron deficiency anemia, unspecified: Secondary | ICD-10-CM | POA: Diagnosis not present

## 2011-07-28 DIAGNOSIS — N2581 Secondary hyperparathyroidism of renal origin: Secondary | ICD-10-CM | POA: Diagnosis not present

## 2011-07-28 DIAGNOSIS — D631 Anemia in chronic kidney disease: Secondary | ICD-10-CM | POA: Diagnosis not present

## 2011-07-28 DIAGNOSIS — Z992 Dependence on renal dialysis: Secondary | ICD-10-CM | POA: Diagnosis not present

## 2011-07-29 DIAGNOSIS — N186 End stage renal disease: Secondary | ICD-10-CM | POA: Diagnosis not present

## 2011-07-30 DIAGNOSIS — N2581 Secondary hyperparathyroidism of renal origin: Secondary | ICD-10-CM | POA: Diagnosis not present

## 2011-07-30 DIAGNOSIS — D472 Monoclonal gammopathy: Secondary | ICD-10-CM | POA: Diagnosis not present

## 2011-07-30 DIAGNOSIS — N186 End stage renal disease: Secondary | ICD-10-CM | POA: Diagnosis not present

## 2011-07-30 DIAGNOSIS — D631 Anemia in chronic kidney disease: Secondary | ICD-10-CM | POA: Diagnosis not present

## 2011-07-30 DIAGNOSIS — D509 Iron deficiency anemia, unspecified: Secondary | ICD-10-CM | POA: Diagnosis not present

## 2011-07-30 DIAGNOSIS — Z992 Dependence on renal dialysis: Secondary | ICD-10-CM | POA: Diagnosis not present

## 2011-07-31 DIAGNOSIS — N186 End stage renal disease: Secondary | ICD-10-CM | POA: Diagnosis not present

## 2011-08-01 DIAGNOSIS — Z992 Dependence on renal dialysis: Secondary | ICD-10-CM | POA: Diagnosis not present

## 2011-08-01 DIAGNOSIS — D631 Anemia in chronic kidney disease: Secondary | ICD-10-CM | POA: Diagnosis not present

## 2011-08-01 DIAGNOSIS — N186 End stage renal disease: Secondary | ICD-10-CM | POA: Diagnosis not present

## 2011-08-01 DIAGNOSIS — N2581 Secondary hyperparathyroidism of renal origin: Secondary | ICD-10-CM | POA: Diagnosis not present

## 2011-08-01 DIAGNOSIS — D472 Monoclonal gammopathy: Secondary | ICD-10-CM | POA: Diagnosis not present

## 2011-08-01 DIAGNOSIS — D509 Iron deficiency anemia, unspecified: Secondary | ICD-10-CM | POA: Diagnosis not present

## 2011-08-06 DIAGNOSIS — N186 End stage renal disease: Secondary | ICD-10-CM | POA: Diagnosis not present

## 2011-08-06 DIAGNOSIS — G894 Chronic pain syndrome: Secondary | ICD-10-CM | POA: Diagnosis not present

## 2011-08-13 DIAGNOSIS — D649 Anemia, unspecified: Secondary | ICD-10-CM | POA: Diagnosis not present

## 2011-08-19 DIAGNOSIS — N039 Chronic nephritic syndrome with unspecified morphologic changes: Secondary | ICD-10-CM | POA: Diagnosis not present

## 2011-08-20 DIAGNOSIS — M25579 Pain in unspecified ankle and joints of unspecified foot: Secondary | ICD-10-CM | POA: Diagnosis not present

## 2011-08-21 DIAGNOSIS — E039 Hypothyroidism, unspecified: Secondary | ICD-10-CM | POA: Diagnosis not present

## 2011-08-21 DIAGNOSIS — D649 Anemia, unspecified: Secondary | ICD-10-CM | POA: Diagnosis not present

## 2011-08-24 DIAGNOSIS — R609 Edema, unspecified: Secondary | ICD-10-CM | POA: Diagnosis not present

## 2011-08-24 DIAGNOSIS — D649 Anemia, unspecified: Secondary | ICD-10-CM | POA: Diagnosis not present

## 2011-08-26 DIAGNOSIS — M79609 Pain in unspecified limb: Secondary | ICD-10-CM | POA: Diagnosis not present

## 2011-08-26 DIAGNOSIS — R609 Edema, unspecified: Secondary | ICD-10-CM | POA: Diagnosis not present

## 2011-08-30 DIAGNOSIS — N186 End stage renal disease: Secondary | ICD-10-CM | POA: Diagnosis not present

## 2011-09-01 DIAGNOSIS — D510 Vitamin B12 deficiency anemia due to intrinsic factor deficiency: Secondary | ICD-10-CM | POA: Diagnosis not present

## 2011-09-01 DIAGNOSIS — B351 Tinea unguium: Secondary | ICD-10-CM | POA: Diagnosis not present

## 2011-09-01 DIAGNOSIS — N186 End stage renal disease: Secondary | ICD-10-CM | POA: Diagnosis not present

## 2011-09-01 DIAGNOSIS — D509 Iron deficiency anemia, unspecified: Secondary | ICD-10-CM | POA: Diagnosis not present

## 2011-09-01 DIAGNOSIS — N2581 Secondary hyperparathyroidism of renal origin: Secondary | ICD-10-CM | POA: Diagnosis not present

## 2011-09-01 DIAGNOSIS — B353 Tinea pedis: Secondary | ICD-10-CM | POA: Diagnosis not present

## 2011-09-01 DIAGNOSIS — D472 Monoclonal gammopathy: Secondary | ICD-10-CM | POA: Diagnosis not present

## 2011-09-01 DIAGNOSIS — D631 Anemia in chronic kidney disease: Secondary | ICD-10-CM | POA: Diagnosis not present

## 2011-09-01 DIAGNOSIS — M79609 Pain in unspecified limb: Secondary | ICD-10-CM | POA: Diagnosis not present

## 2011-09-15 DIAGNOSIS — R062 Wheezing: Secondary | ICD-10-CM | POA: Diagnosis not present

## 2011-09-16 DIAGNOSIS — D509 Iron deficiency anemia, unspecified: Secondary | ICD-10-CM | POA: Diagnosis not present

## 2011-09-16 DIAGNOSIS — D649 Anemia, unspecified: Secondary | ICD-10-CM | POA: Diagnosis not present

## 2011-09-20 ENCOUNTER — Other Ambulatory Visit: Payer: Self-pay

## 2011-09-20 DIAGNOSIS — T783XXA Angioneurotic edema, initial encounter: Secondary | ICD-10-CM | POA: Diagnosis not present

## 2011-09-20 DIAGNOSIS — R220 Localized swelling, mass and lump, head: Secondary | ICD-10-CM | POA: Diagnosis not present

## 2011-09-20 DIAGNOSIS — R609 Edema, unspecified: Secondary | ICD-10-CM | POA: Diagnosis not present

## 2011-09-20 DIAGNOSIS — R0602 Shortness of breath: Secondary | ICD-10-CM | POA: Diagnosis not present

## 2011-09-20 IMAGING — CR DG NECK SOFT TISSUE
1 series · 1 of 1 positions shown · non-contrast
Comparison: none

CLINICAL DATA: And wheezing

NECK SOFT TISSUES - 1+ VIEW

[view not recorded]
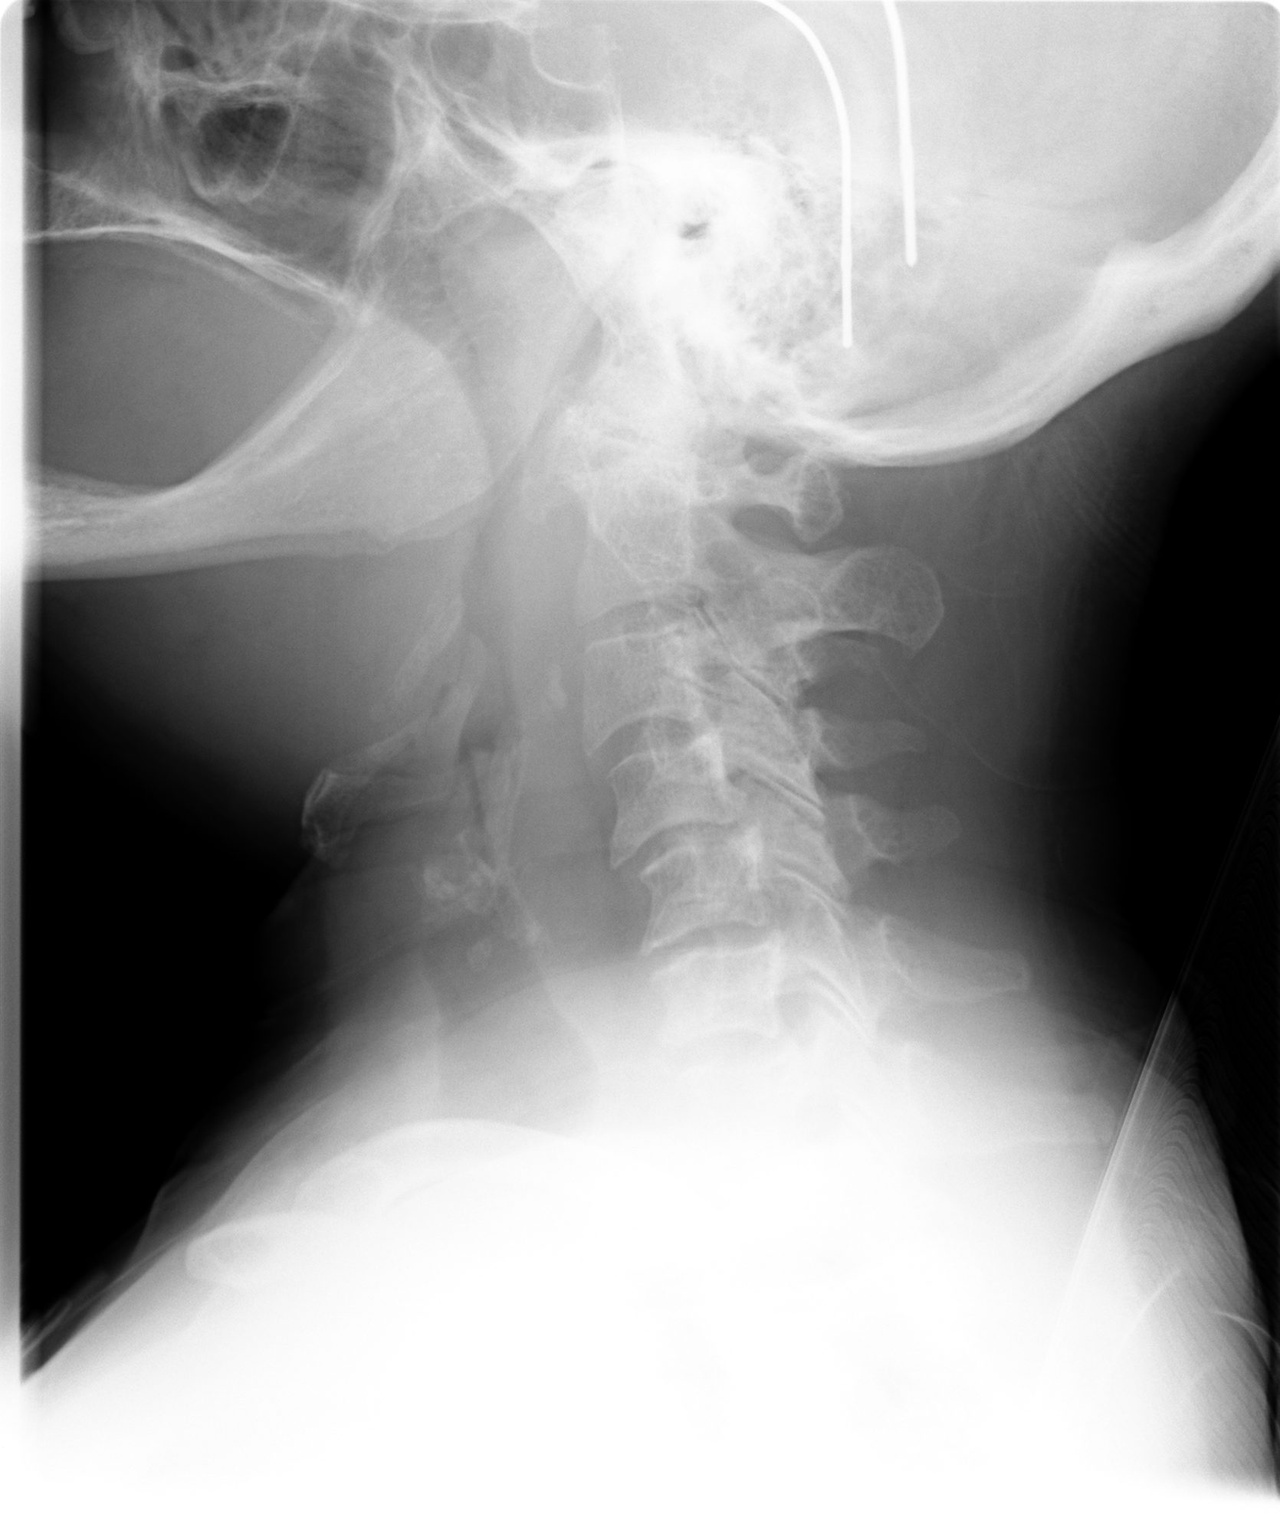

[1 of 1 positions shown; findings below may reference images not displayed]

FINDINGS: There is prevertebral soft tissue swelling anterior to
the C2 through C6 vertebral bodies measuring up to 2 cm..  No
retropharyngeal gas is present.  The glottis is normal.  No acute
bony abnormality.
IMPRESSION: Prevertebral soft tissue swelling from C2 through C6.

The findings discussed Dr. SYLINIE on [DATE] at [A0] hours

## 2011-09-20 IMAGING — CR DG CHEST 2V
1 series · 1 of 1 positions shown · non-contrast
Comparison: None available

CLINICAL DATA: Angioedema

CHEST - 2 VIEW

[view not recorded]
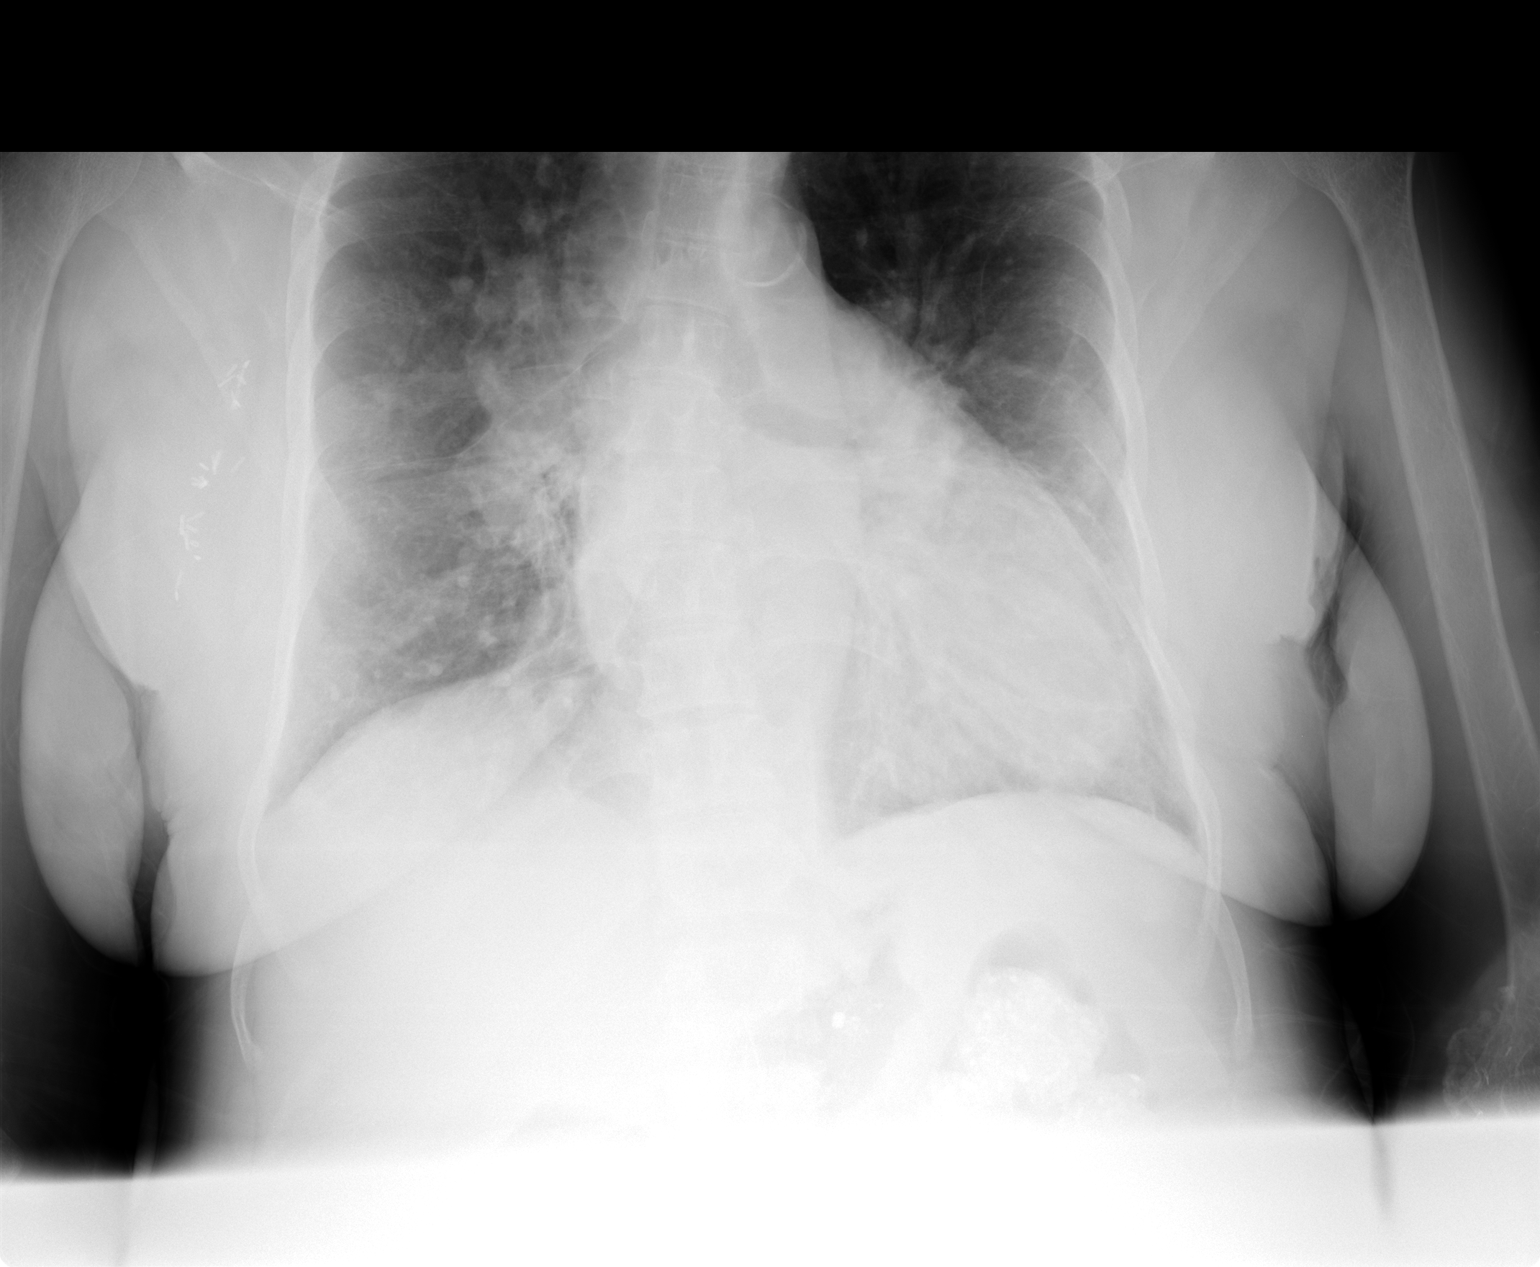

[1 of 1 positions shown; findings below may reference images not displayed]

FINDINGS: Enlarged heart silhouette.  There is perihilar fullness.
No comparison available. There is nodularity in left or right lung
which may represent granulomas disease.  Lymphadenectomy clips in
the right axilla.
IMPRESSION: 1..  Perihilar fullness on the right and left.  No comparison.
2.  Nodularity may represent granulomatous disease.
3.  If no chest x-ray or CT comparison, recommend non emergent CT
thorax.

Findings discussed  with Dr. KIRTOAKEH on [DATE] at [DATE] hours

## 2011-09-21 ENCOUNTER — Inpatient Hospital Stay (HOSPITAL_COMMUNITY): Payer: Medicare Other

## 2011-09-21 ENCOUNTER — Observation Stay (HOSPITAL_COMMUNITY)
Admission: EM | Admit: 2011-09-21 | Discharge: 2011-09-24 | Disposition: A | Discharge status: Skilled Nursing Facility | Payer: Medicare Other | Attending: Internal Medicine | Admitting: Internal Medicine

## 2011-09-21 DIAGNOSIS — I509 Heart failure, unspecified: Secondary | ICD-10-CM

## 2011-09-21 DIAGNOSIS — F411 Generalized anxiety disorder: Secondary | ICD-10-CM

## 2011-09-21 DIAGNOSIS — N186 End stage renal disease: Secondary | ICD-10-CM

## 2011-09-21 DIAGNOSIS — K226 Gastro-esophageal laceration-hemorrhage syndrome: Secondary | ICD-10-CM

## 2011-09-21 DIAGNOSIS — M25529 Pain in unspecified elbow: Secondary | ICD-10-CM

## 2011-09-21 DIAGNOSIS — Z8639 Personal history of other endocrine, nutritional and metabolic disease: Secondary | ICD-10-CM

## 2011-09-21 DIAGNOSIS — I2789 Other specified pulmonary heart diseases: Secondary | ICD-10-CM | POA: Insufficient documentation

## 2011-09-21 DIAGNOSIS — F329 Major depressive disorder, single episode, unspecified: Secondary | ICD-10-CM

## 2011-09-21 DIAGNOSIS — R51 Headache: Secondary | ICD-10-CM

## 2011-09-21 DIAGNOSIS — T783XXA Angioneurotic edema, initial encounter: Secondary | ICD-10-CM

## 2011-09-21 DIAGNOSIS — Z862 Personal history of diseases of the blood and blood-forming organs and certain disorders involving the immune mechanism: Secondary | ICD-10-CM

## 2011-09-21 DIAGNOSIS — R229 Localized swelling, mass and lump, unspecified: Secondary | ICD-10-CM

## 2011-09-21 DIAGNOSIS — E8779 Other fluid overload: Secondary | ICD-10-CM | POA: Diagnosis not present

## 2011-09-21 DIAGNOSIS — B20 Human immunodeficiency virus [HIV] disease: Secondary | ICD-10-CM

## 2011-09-21 DIAGNOSIS — N289 Disorder of kidney and ureter, unspecified: Secondary | ICD-10-CM

## 2011-09-21 DIAGNOSIS — Z9079 Acquired absence of other genital organ(s): Secondary | ICD-10-CM

## 2011-09-21 DIAGNOSIS — K439 Ventral hernia without obstruction or gangrene: Secondary | ICD-10-CM

## 2011-09-21 DIAGNOSIS — D649 Anemia, unspecified: Secondary | ICD-10-CM | POA: Diagnosis not present

## 2011-09-21 DIAGNOSIS — N039 Chronic nephritic syndrome with unspecified morphologic changes: Secondary | ICD-10-CM | POA: Diagnosis not present

## 2011-09-21 DIAGNOSIS — Z87891 Personal history of nicotine dependence: Secondary | ICD-10-CM

## 2011-09-21 DIAGNOSIS — K2210 Ulcer of esophagus without bleeding: Secondary | ICD-10-CM

## 2011-09-21 DIAGNOSIS — K219 Gastro-esophageal reflux disease without esophagitis: Secondary | ICD-10-CM

## 2011-09-21 DIAGNOSIS — F1010 Alcohol abuse, uncomplicated: Secondary | ICD-10-CM

## 2011-09-21 DIAGNOSIS — K859 Acute pancreatitis without necrosis or infection, unspecified: Secondary | ICD-10-CM

## 2011-09-21 DIAGNOSIS — Z21 Asymptomatic human immunodeficiency virus [HIV] infection status: Secondary | ICD-10-CM

## 2011-09-21 DIAGNOSIS — I10 Essential (primary) hypertension: Secondary | ICD-10-CM | POA: Diagnosis not present

## 2011-09-21 DIAGNOSIS — Z8719 Personal history of other diseases of the digestive system: Secondary | ICD-10-CM

## 2011-09-21 DIAGNOSIS — D631 Anemia in chronic kidney disease: Secondary | ICD-10-CM | POA: Diagnosis not present

## 2011-09-21 DIAGNOSIS — F3289 Other specified depressive episodes: Secondary | ICD-10-CM

## 2011-09-21 DIAGNOSIS — X58XXXA Exposure to other specified factors, initial encounter: Secondary | ICD-10-CM | POA: Insufficient documentation

## 2011-09-21 DIAGNOSIS — E8770 Fluid overload, unspecified: Secondary | ICD-10-CM | POA: Diagnosis present

## 2011-09-21 DIAGNOSIS — J189 Pneumonia, unspecified organism: Secondary | ICD-10-CM

## 2011-09-21 DIAGNOSIS — J209 Acute bronchitis, unspecified: Secondary | ICD-10-CM

## 2011-09-21 DIAGNOSIS — N61 Mastitis without abscess: Secondary | ICD-10-CM

## 2011-09-21 DIAGNOSIS — R599 Enlarged lymph nodes, unspecified: Secondary | ICD-10-CM

## 2011-09-21 DIAGNOSIS — Z87898 Personal history of other specified conditions: Secondary | ICD-10-CM

## 2011-09-21 DIAGNOSIS — K922 Gastrointestinal hemorrhage, unspecified: Secondary | ICD-10-CM

## 2011-09-21 DIAGNOSIS — R197 Diarrhea, unspecified: Secondary | ICD-10-CM

## 2011-09-21 LAB — BASIC METABOLIC PANEL
BUN: 29 mg/dL — ABNORMAL HIGH (ref 6–23)
CO2: 27 mEq/L (ref 19–32)
Calcium: 8.1 mg/dL — ABNORMAL LOW (ref 8.4–10.5)
Creatinine, Ser: 10.36 mg/dL — ABNORMAL HIGH (ref 0.50–1.10)
Creatinine, Ser: 10.89 mg/dL — ABNORMAL HIGH (ref 0.50–1.10)
GFR calc Af Amer: 4 mL/min — ABNORMAL LOW (ref >90)
GFR calc Af Amer: 4 mL/min — ABNORMAL LOW (ref >90)
GFR calc non Af Amer: 4 mL/min — ABNORMAL LOW (ref >90)
GFR calc non Af Amer: 3 mL/min — ABNORMAL LOW (ref >90)
Glucose, Bld: 135 mg/dL — ABNORMAL HIGH (ref 70–99)
Potassium: 5.3 mEq/L — ABNORMAL HIGH (ref 3.5–5.1)
Sodium: 138 mEq/L (ref 135–145)

## 2011-09-21 LAB — CBC WITH DIFFERENTIAL/PLATELET
Basophils Absolute: 0 10*3/uL (ref 0.0–0.1)
Basophils Relative: 0 % (ref 0–1)
Basophils Relative: 0 % (ref 0–1)
Eosinophils Absolute: 0 10*3/uL (ref 0.0–0.7)
Eosinophils Absolute: 0 10*3/uL (ref 0.0–0.7)
HCT: 25.2 % — ABNORMAL LOW (ref 36.0–46.0)
Hemoglobin: 8.5 g/dL — ABNORMAL LOW (ref 12.0–15.0)
Hemoglobin: 7.9 g/dL — ABNORMAL LOW (ref 12.0–15.0)
Lymphs Abs: 0.7 10*3/uL (ref 0.7–4.0)
MCH: 32.9 pg (ref 26.0–34.0)
MCH: 32.8 pg (ref 26.0–34.0)
MCHC: 31.3 g/dL (ref 30.0–36.0)
MCHC: 31.3 g/dL (ref 30.0–36.0)
Monocytes Absolute: 0 10*3/uL — ABNORMAL LOW (ref 0.1–1.0)
Monocytes Relative: 2 % — ABNORMAL LOW (ref 3–12)
Neutro Abs: 1.3 10*3/uL — ABNORMAL LOW (ref 1.7–7.7)
Neutrophils Relative %: 59 % (ref 43–77)
Neutrophils Relative %: 83 % — ABNORMAL HIGH (ref 43–77)
Platelets: 105 10*3/uL — ABNORMAL LOW (ref 150–400)
RBC: 2.58 MIL/uL — ABNORMAL LOW (ref 3.87–5.11)
RDW: 15.5 % (ref 11.5–15.5)

## 2011-09-21 LAB — CBC
MCV: 104.9 fL — ABNORMAL HIGH (ref 78.0–100.0)
Platelets: 111 10*3/uL — ABNORMAL LOW (ref 150–400)
RBC: 2.44 MIL/uL — ABNORMAL LOW (ref 3.87–5.11)
RDW: 15.4 % (ref 11.5–15.5)
WBC: 2 10*3/uL — ABNORMAL LOW (ref 4.0–10.5)

## 2011-09-21 LAB — COMPREHENSIVE METABOLIC PANEL
ALT: 7 U/L (ref 0–35)
Albumin: 3 g/dL — ABNORMAL LOW (ref 3.5–5.2)
Alkaline Phosphatase: 79 U/L (ref 39–117)
Chloride: 97 mEq/L (ref 96–112)
Potassium: 3.6 mEq/L (ref 3.5–5.1)
Sodium: 135 mEq/L (ref 135–145)
Total Bilirubin: 0.3 mg/dL (ref 0.3–1.2)
Total Protein: 9.4 g/dL — ABNORMAL HIGH (ref 6.0–8.3)

## 2011-09-21 LAB — MRSA PCR SCREENING: MRSA by PCR: NEGATIVE

## 2011-09-21 MED ORDER — EPOETIN ALFA 10000 UNIT/ML IJ SOLN
10000.0000 [IU] | INTRAMUSCULAR | Status: DC
  Administered 2011-09-21 – 2011-09-23 (×3): 10000 [IU] via INTRAVENOUS
  Filled 2011-09-21 (×5): qty 1

## 2011-09-21 MED ORDER — POLYVINYL ALCOHOL 1.4 % OP SOLN
1.0000 [drp] | Freq: Two times a day (BID) | OPHTHALMIC | Status: DC
  Administered 2011-09-21 – 2011-09-24 (×6): 1 [drp] via OPHTHALMIC
  Filled 2011-09-21: qty 15

## 2011-09-21 MED ORDER — PANTOPRAZOLE SODIUM 40 MG PO TBEC
40.0000 mg | DELAYED_RELEASE_TABLET | Freq: Two times a day (BID) | ORAL | Status: DC
  Administered 2011-09-22 – 2011-09-24 (×5): 40 mg via ORAL
  Filled 2011-09-21 (×5): qty 1

## 2011-09-21 MED ORDER — CINACALCET HCL 30 MG PO TABS
30.0000 mg | ORAL_TABLET | Freq: Every day | ORAL | Status: DC
  Administered 2011-09-22 – 2011-09-23 (×2): 30 mg via ORAL
  Filled 2011-09-21 (×4): qty 1

## 2011-09-21 MED ORDER — LORAZEPAM 0.5 MG PO TABS: 0.2500 mg | ORAL_TABLET | Freq: Two times a day (BID) | ORAL | Status: DC | PRN

## 2011-09-21 MED ORDER — RENA-VITE PO TABS
1.0000 | ORAL_TABLET | Freq: Every day | ORAL | Status: DC
  Administered 2011-09-22 – 2011-09-24 (×3): 1 via ORAL
  Filled 2011-09-21 (×7): qty 1

## 2011-09-21 MED ORDER — GABAPENTIN 300 MG PO CAPS: 300.0000 mg | ORAL_CAPSULE | Freq: Two times a day (BID) | ORAL | Status: DC

## 2011-09-21 MED ORDER — ZIDOVUDINE 100 MG PO CAPS
100.0000 mg | ORAL_CAPSULE | Freq: Three times a day (TID) | ORAL | Status: DC
  Administered 2011-09-21 – 2011-09-24 (×8): 100 mg via ORAL
  Filled 2011-09-21 (×12): qty 1

## 2011-09-21 MED ORDER — RALTEGRAVIR POTASSIUM 400 MG PO TABS
400.0000 mg | ORAL_TABLET | Freq: Two times a day (BID) | ORAL | Status: DC
  Administered 2011-09-21 – 2011-09-24 (×6): 400 mg via ORAL
  Filled 2011-09-21 (×8): qty 1

## 2011-09-21 MED ORDER — HYPROMELLOSE (GONIOSCOPIC) 2.5 % OP SOLN
1.0000 [drp] | Freq: Two times a day (BID) | OPHTHALMIC | Status: DC
  Filled 2011-09-21: qty 15

## 2011-09-21 MED ORDER — SODIUM CHLORIDE 0.9 % IJ SOLN
INTRAMUSCULAR | Status: AC
  Administered 2011-09-23: 01:00:00
  Filled 2011-09-21: qty 3

## 2011-09-21 MED ORDER — LORAZEPAM 2 MG/ML PO CONC: 0.1250 mg | Freq: Four times a day (QID) | ORAL | Status: DC | PRN

## 2011-09-21 MED ORDER — ALBUTEROL SULFATE (5 MG/ML) 0.5% IN NEBU: 2.5000 mg | INHALATION_SOLUTION | Freq: Four times a day (QID) | RESPIRATORY_TRACT | Status: DC | PRN

## 2011-09-21 MED ORDER — DIPHENHYDRAMINE HCL 25 MG PO CAPS
25.0000 mg | ORAL_CAPSULE | Freq: Three times a day (TID) | ORAL | Status: DC
  Administered 2011-09-21 – 2011-09-24 (×9): 25 mg via ORAL
  Filled 2011-09-21 (×9): qty 1

## 2011-09-21 MED ORDER — METHYLPREDNISOLONE SODIUM SUCC 125 MG IJ SOLR
80.0000 mg | Freq: Two times a day (BID) | INTRAMUSCULAR | Status: DC
  Administered 2011-09-21 – 2011-09-24 (×7): 80 mg via INTRAVENOUS
  Filled 2011-09-21 (×7): qty 2

## 2011-09-21 MED ORDER — IPRATROPIUM BROMIDE 0.02 % IN SOLN: 0.5000 mg | Freq: Four times a day (QID) | RESPIRATORY_TRACT | Status: DC | PRN

## 2011-09-21 MED ORDER — FENTANYL 25 MCG/HR TD PT72
25.0000 ug | MEDICATED_PATCH | TRANSDERMAL | Status: DC
  Administered 2011-09-22 – 2011-09-24 (×2): 25 ug via TRANSDERMAL
  Filled 2011-09-21 (×2): qty 1

## 2011-09-21 MED ORDER — LAMIVUDINE 10 MG/ML PO SOLN
50.0000 mg | Freq: Every day | ORAL | Status: DC
  Administered 2011-09-24: 50 mg via ORAL
  Filled 2011-09-21 (×5): qty 5

## 2011-09-21 MED ORDER — HEPARIN SODIUM (PORCINE) 1000 UNIT/ML DIALYSIS
300.0000 [IU] | INTRAMUSCULAR | Status: DC | PRN
  Filled 2011-09-21: qty 1

## 2011-09-21 MED ORDER — ACETAMINOPHEN 325 MG PO TABS
650.0000 mg | ORAL_TABLET | ORAL | Status: DC | PRN
  Administered 2011-09-21: 650 mg via ORAL
  Filled 2011-09-21: qty 2

## 2011-09-21 MED ORDER — SILDENAFIL CITRATE 20 MG PO TABS
20.0000 mg | ORAL_TABLET | Freq: Three times a day (TID) | ORAL | Status: DC
  Administered 2011-09-21 – 2011-09-24 (×8): 20 mg via ORAL
  Filled 2011-09-21 (×12): qty 1

## 2011-09-21 MED ORDER — OXYCODONE HCL 5 MG PO TABS
5.0000 mg | ORAL_TABLET | Freq: Four times a day (QID) | ORAL | Status: DC | PRN
  Administered 2011-09-21 – 2011-09-23 (×4): 5 mg via ORAL
  Filled 2011-09-21 (×4): qty 1

## 2011-09-21 MED ORDER — HEPARIN SODIUM (PORCINE) 1000 UNIT/ML DIALYSIS
20.0000 [IU]/kg | INTRAMUSCULAR | Status: DC | PRN
  Administered 2011-09-21: 1300 [IU] via INTRAVENOUS_CENTRAL
  Filled 2011-09-21: qty 2

## 2011-09-21 MED FILL — Diphenhydramine HCl Cap 25 MG: ORAL | Qty: 1 | Status: AC

## 2011-09-21 MED FILL — Oxycodone w/ Acetaminophen Tab 5-325 MG: ORAL | Qty: 1 | Status: AC

## 2011-09-21 MED FILL — Prednisone Tab 20 MG: ORAL | Qty: 3 | Status: AC

## 2011-09-21 NOTE — Consult Note (Signed)
Reason for Consult: End-stage renal disease Referring Physician : Dr. Kathie Dike  [name] is an 61 y.o. female.  HPI:  she is patient who has history of her HIV positive, pulmonary hypertension, end-stage renal disease on maintenance hemodialysis presently came because of increased swelling of her face, tongue and also oral mucosa. She has occasional difficulty breathing but otherwise she feels okay. She has occasional cough no sputum production. She denies any chest pain shouldn't have any nausea vomiting.   Past Medical History  Diagnosis Date  . Pulmonary HTN   . Renal disorder   . Hypertension   . Depression   . Insomnia   . Chronic diarrhea   . HIV (human immunodeficiency virus infection)     Past Surgical History  Procedure Date  . Dialysis shunts   . Esophagogastroduodenoscopy 12/15/2010    Procedure: ESOPHAGOGASTRODUODENOSCOPY (EGD);  Surgeon: Daneil Dolin, MD;  Location: [facility];  Service: Endoscopy;  Laterality: N/A;    Family History  Problem Relation Age of Onset  . Anesthesia problems Neg Hx   . Hypotension Neg Hx   . Malignant hyperthermia Neg Hx   . Pseudochol deficiency Neg Hx     Social History:  reports that she has never smoked. She has never used smokeless tobacco. She reports that she does not drink alcohol or use illicit drugs.  Allergies:  Allergies  Allergen Reactions  . Penicillins     REACTION: rash  . Latex Rash    Medications: I have reviewed the patient's current medications.  No results found for this or any previous visit (from the past 48 hour(s)).  No results found.  Review of Systems  Respiratory: Positive for cough and sputum production.   Neurological: Positive for weakness.   Blood pressure 131/84, pulse 62, temperature 97.4 F (36.3 C), temperature source Oral, resp. rate 18, weight 66.497 kg (146 lb 9.6 oz), SpO2 100.00%. Physical Exam  Eyes: No scleral icterus.  Neck: JVD present.  Cardiovascular:  Normal rate and normal heart sounds.   No murmur heard. Respiratory: No respiratory distress. She has wheezes. She has no rales.  GI: She exhibits no distension. There is no tenderness.  Musculoskeletal: She exhibits no edema.  Lymphadenopathy:    She has cervical adenopathy.    Assessment/Plan:  problem #1 end-stage renal disease she status post hemodialysis on Saturday her pending creatinine was in acceptable range. She doesn't have any uremic sinus symptoms. Problem #2 swelling of her face at this moment etiology not clear. Recently patient has missed her dialysis cannot is fluid overload and with similar presentation. Her exact time dialysis was done patient improved. Presently still she seems to have some facial swelling and IV edema. 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 Problem #3 history of hypertension blood pressure seems to be reasonably controlled Problem #4 HIV positive Problem #6 history of hyperphosphatemia patient on binder. Problem #7 history of pulmonary hypertension Problem #8 history of lymphoproliferative disease. Problem #9 history of anemia. Plan: We'll start patient on dialysis today and try to get about 3-4 L depending on her blood pressure. If the patient doesn't improve possibly will do CT scan of the chest to rule out the recurrence of her a lymphoproliferative disease. We'll check her her CBC, basic metabolic panel and phosphorus in the morning. Also the patient one and half liters of fluid  restriction.   Tahirah Sara S 09/21/2011, 10:18 AM

## 2011-09-21 NOTE — Progress Notes (Signed)
Pt. C/o severe cramping near end of HD. Pt. Became hysterical and demanded to be taken off dialysis tx. Pt rinsed back with approximately 20 minutes remaining of HD. Dr. Lowanda Foster notified.

## 2011-09-21 NOTE — H&P (Signed)
Chief Complaint:  Facial swelling  HPI: 61 yo female esrd on dialysi who comes in from snf for tongue, lip swellihgn that started earlier tonight. Pt deneis any new meds.  No fevers.  No recent illnesses.  Is hiv pos but denies again any new meds.  Not on ace inh.  No rashes.  She has received tx in ed and has decreased swelling in her tongue and lips.  Denies any sob.  Review of Systems:  O/w neg  Past Medical History: Past Medical History  Diagnosis Date  . Pulmonary HTN   . Renal disorder   . Hypertension   . Depression   . Insomnia   . Chronic diarrhea   . HIV (human immunodeficiency virus infection)    Past Surgical History  Procedure Date  . Dialysis shunts   . Esophagogastroduodenoscopy 12/15/2010    Procedure: ESOPHAGOGASTRODUODENOSCOPY (EGD);  Surgeon: Daneil Dolin, MD;  Location: [facility];  Service: Endoscopy;  Laterality: N/A;    Medications: Prior to Admission medications   Medication Sig Start Date End Date Taking? Authorizing Provider  ALPRAZolam Duanne Moron) 0.5 MG tablet Take 0.5 mg by mouth 2 (two) times daily as needed. anxiety    Historical Provider, MD  b complex-vitamin c-folic acid (NEPHRO-VITE) 0.8 MG TABS Take 0.8 mg by mouth at bedtime.      Historical Provider, MD  epoetin alfa (EPOGEN) 10000 UNIT/ML injection Inject 10,000 Units into the skin. To be given at dialysis     Historical Provider, MD  fentaNYL (DURAGESIC - DOSED MCG/HR) 25 MCG/HR Place 1 patch onto the skin every other day.      Historical Provider, MD  gabapentin (NEURONTIN) 300 MG capsule Take 300 mg by mouth every 12 (twelve) hours.      Historical Provider, MD  hydroxypropyl methylcellulose (ISOPTO TEARS) 2.5 % ophthalmic solution Place 1 drop into both eyes 2 (two) times daily.      Historical Provider, MD  lamivudine (EPIVIR) 100 MG tablet Take 0.5 tablets (50 mg total) by mouth daily. Take one half tablet daily 06/11/10   Campbell Riches, MD  lanthanum (FOSRENOL) 500 MG chewable  tablet Chew 3,000 mg by mouth 3 (three) times daily with meals.     Historical Provider, MD  lidocaine (LIDODERM) 5 % Place 1 patch onto the skin daily. Remove & Discard patch within 12 hours or as directed by MD     Historical Provider, MD  ondansetron (ZOFRAN) 4 MG tablet Take 2 mg by mouth every 4 (four) hours as needed. nausea    Historical Provider, MD  oxycodone (OXY-IR) 5 MG capsule Take 5 mg by mouth every 6 (six) hours as needed. For pain    Historical Provider, MD  pantoprazole (PROTONIX) 40 MG tablet Take 1 tablet (40 mg total) by mouth 2 (two) times daily before a meal. 12/16/10 12/16/11  Delfina Redwood, MD  raltegravir (ISENTRESS) 400 MG tablet Take 400 mg by mouth 2 (two) times daily.      Historical Provider, MD  sildenafil (REVATIO) 20 MG tablet Take 20 mg by mouth 3 (three) times daily.      Historical Provider, MD  Whey Protein Isolate POWD Take 1 Package by mouth 2 (two) times daily. With vanilla pudding     Historical Provider, MD  zidovudine (RETROVIR) 100 MG capsule Take 100 mg by mouth 3 (three) times daily.      Historical Provider, MD    Allergies:   Allergies  Allergen Reactions  .  Penicillins     REACTION: rash  . Latex Rash    Social History:  reports that she has never smoked. She has never used smokeless tobacco. She reports that she does not drink alcohol or use illicit drugs.  Family History: Family History  Problem Relation Age of Onset  . Anesthesia problems Neg Hx   . Hypotension Neg Hx   . Malignant hyperthermia Neg Hx   . Pseudochol deficiency Neg Hx     Physical Exam: Filed Vitals:   09/21/11 0300  BP: 159/87  Pulse: 72  Temp: 98 F (36.7 C)  TempSrc: Oral  Resp: 16  SpO2: 100%   General appearance: alert, cooperative and no distress  Mild swelling lips and tongue no difficulty in breathing. Lungs: clear to auscultation bilaterally Heart: regular rate and rhythm, S1, S2 normal, no murmur, click, rub or gallop Abdomen: soft,  non-tender; bowel sounds normal; no masses,  no organomegaly Extremities: extremities normal, atraumatic, no cyanosis or edema Pulses: 2+ and symmetric Skin: Skin color, texture, turgor normal. No rashes or lesions Neurologic: Grossly normal    Labs on Admission:   Labs pending in computer system due to system down.  All have been reviewed.  Radiological Exams on Admission: No results found.  Assessment/Plan Present on Admission:  61 yo female with angioedema .Angioedema of lips .HUMAN IMMUNODEFICIENCY VIRUS [HIV] .End stage renal disease .ANEMIA, NORMOCYTIC, CHRONIC .HYPERTENSION  Unclear what is the cause of her angioedema.  Will obs overnigth and ask pharm D to go over meds to see which one may be the culprit, on mult hiv meds will continue those.  Place on steriods and benadryl.  HD on t/r/sat.  Get nephro consult in case she is here thru Monday.  Lynnsey Barbara A [id number] 09/21/2011, 4:21 AM

## 2011-09-21 NOTE — Progress Notes (Signed)
Outpatient plan of care reviewed / on chart. Treatment started without difficulty.

## 2011-09-21 NOTE — Clinical Social Work Psychosocial (Signed)
Clinical Social Work Department BRIEF PSYCHOSOCIAL ASSESSMENT 09/21/2011  Patient:  [name], [name]     Account Number:  [id number]     Admit date:  09/21/2011  Clinical Social Worker:  Wyatt Haste  Date/Time:  09/21/2011 09:10 AM  Referred by:  RN  Date Referred:  09/21/2011 Referred for  SNF Placement   Other Referral:   Interview type:  Patient Other interview type:    PSYCHOSOCIAL DATA Living Status:  FACILITY Admitted from facility:  [facility] Level of care:  [place] Primary support name:  Kieth Brightly Primary support relationship to patient:  [place] Degree of support available:   adequate per pt    CURRENT CONCERNS Current Concerns  Post-Acute Placement   Other Concerns:    SOCIAL WORK ASSESSMENT / PLAN CSW met with pt at bedside. Pt is alert and oriented and well known to CSW. Pt has been resident at [facility] for 3 years.  She has sisters who she reports are supportive. CSW spoke with Tabatha at [facility] who states pt is long term with facility and nursing level of care. Okay for return per Tabatha.   Assessment/plan status:  Psychosocial Support/Ongoing Assessment of Needs Other assessment/ plan:   Information/referral to community resources:   [employer]    PATIENT'S/FAMILY'S RESPONSE TO PLAN OF CARE: Pt is anxious to return to [facility] as soon as possible. She considers facility her home and is involved in activities there. CSW will continue to follow.        [name], [place]

## 2011-09-21 NOTE — Progress Notes (Signed)
Patient admitted earlier this morning by Dr. Shanon Brow  Patient seen and examined, database reviewed  Discussed with Dr. Lowanda Foster who knows the patient very well  Patient admitted with facial swelling.  This has happened before when she has become volume overloaded.  Patient has been non compliant with dialysis and diet/fluid restrictions.  It is felt that her edema may be related to a volume overload vs. Allergic reaction.  For now, we will continue steroids and hemodialysis per Dr. Lowanda Foster.  If her swelling does not improve after HD, may need to do repeat imaging of her chest to rule out SVC syndrome.  Will also ask pharmacy to review for any possible allergic drug reactions.

## 2011-09-21 NOTE — Consult Note (Signed)
MEDICATION RELATED CONSULT NOTE - INITIAL   Pharmacy Consult for review medications for potential offending agent  Indication: admitted with angioedema, swelling of lips,  Allergies  Allergen Reactions  . Penicillins     REACTION: rash  . Latex Rash   Patient Measurements: Weight: 146 lb 9.6 oz (66.497 kg)  Vital Signs: Temp: 97.4 F (36.3 C) (07/22 0501) Temp src: Oral (07/22 0501) BP: 131/84 mmHg (07/22 0501) Pulse Rate: 62  (07/22 0501) Intake/Output from previous day:    Labs: No results found for this basename: WBC:3,HGB:3,HCT:3,PLT:3,APTT:3;INR:3,CREATININE:3,LABCREA:3,CREATININE:3,CREAT24HRUR:3,MG:3,PHOS:3,ALBUMIN:3,PROT:3,ALBUMIN:3,AST:3,ALT:3,ALKPHOS:3,BILITOT:3,BILIDIR:3,IBILI:3 in the last 72 hours The CrCl is unknown because both a height and weight (above a minimum accepted value) are required for this calculation.  Medical History: Past Medical History  Diagnosis Date  . Pulmonary HTN   . Renal disorder   . Hypertension   . Depression   . Insomnia   . Chronic diarrhea   . HIV (human immunodeficiency virus infection)    Medications:  Prescriptions prior to admission  Medication Sig Dispense Refill  . ALPRAZolam (XANAX) 0.5 MG tablet Take 0.5 mg by mouth 2 (two) times daily as needed. anxiety      . b complex-vitamin c-folic acid (NEPHRO-VITE) 0.8 MG TABS Take 0.8 mg by mouth at bedtime.        Marland Kitchen epoetin alfa (EPOGEN) 10000 UNIT/ML injection Inject 10,000 Units into the skin. To be given at dialysis       . fentaNYL (DURAGESIC - DOSED MCG/HR) 25 MCG/HR Place 1 patch onto the skin every other day.        . gabapentin (NEURONTIN) 300 MG capsule Take 300 mg by mouth every 12 (twelve) hours.        . hydroxypropyl methylcellulose (ISOPTO TEARS) 2.5 % ophthalmic solution Place 1 drop into both eyes 2 (two) times daily.        Marland Kitchen lamivudine (EPIVIR) 100 MG tablet Take 0.5 tablets (50 mg total) by mouth daily. Take one half tablet daily  150 tablet  90  .  lanthanum (FOSRENOL) 500 MG chewable tablet Chew 3,000 mg by mouth 3 (three) times daily with meals.       . lidocaine (LIDODERM) 5 % Place 1 patch onto the skin daily. Remove & Discard patch within 12 hours or as directed by MD       . ondansetron (ZOFRAN) 4 MG tablet Take 2 mg by mouth every 4 (four) hours as needed. nausea      . oxycodone (OXY-IR) 5 MG capsule Take 5 mg by mouth every 6 (six) hours as needed. For pain      . pantoprazole (PROTONIX) 40 MG tablet Take 1 tablet (40 mg total) by mouth 2 (two) times daily before a meal.      . raltegravir (ISENTRESS) 400 MG tablet Take 400 mg by mouth 2 (two) times daily.        . sildenafil (REVATIO) 20 MG tablet Take 20 mg by mouth 3 (three) times daily.        . Whey Protein Isolate POWD Take 1 Package by mouth 2 (two) times daily. With vanilla pudding       . zidovudine (RETROVIR) 100 MG capsule Take 100 mg by mouth 3 (three) times daily.         Assessment: 60yoF with PMH as above admitted with swelling of lips and face, angioedema.  Home medications reviewed.  Of all of her home medications listed, Gabapentin does have angioedema listed as a potential adverse effect.  Recommendation: Discontinue Gabapentin Use alternative agent for pain control  Jersey Ravenscroft A 09/21/2011,10:46 AM

## 2011-09-22 ENCOUNTER — Encounter (HOSPITAL_COMMUNITY): Payer: Self-pay | Admitting: *Deleted

## 2011-09-22 DIAGNOSIS — N186 End stage renal disease: Secondary | ICD-10-CM | POA: Diagnosis not present

## 2011-09-22 DIAGNOSIS — D649 Anemia, unspecified: Secondary | ICD-10-CM

## 2011-09-22 DIAGNOSIS — Z21 Asymptomatic human immunodeficiency virus [HIV] infection status: Secondary | ICD-10-CM | POA: Diagnosis not present

## 2011-09-22 DIAGNOSIS — T783XXA Angioneurotic edema, initial encounter: Secondary | ICD-10-CM | POA: Diagnosis not present

## 2011-09-22 LAB — CBC
MCHC: 31.7 g/dL (ref 30.0–36.0)
Platelets: 130 10*3/uL — ABNORMAL LOW (ref 150–400)
RDW: 15.5 % (ref 11.5–15.5)
WBC: 3.2 10*3/uL — ABNORMAL LOW (ref 4.0–10.5)

## 2011-09-22 LAB — BASIC METABOLIC PANEL
BUN: 30 mg/dL — ABNORMAL HIGH (ref 6–23)
Creatinine, Ser: 7.79 mg/dL — ABNORMAL HIGH (ref 0.50–1.10)
GFR calc Af Amer: 6 mL/min — ABNORMAL LOW (ref >90)
GFR calc non Af Amer: 5 mL/min — ABNORMAL LOW (ref >90)

## 2011-09-22 LAB — PHOSPHORUS: Phosphorus: 6.6 mg/dL — ABNORMAL HIGH (ref 2.3–4.6)

## 2011-09-22 MED ORDER — LANTHANUM CARBONATE 500 MG PO CHEW
1000.0000 mg | CHEWABLE_TABLET | Freq: Three times a day (TID) | ORAL | Status: DC
  Administered 2011-09-22 – 2011-09-24 (×6): 1000 mg via ORAL
  Filled 2011-09-22 (×10): qty 2

## 2011-09-22 MED ORDER — SODIUM CHLORIDE 0.9 % IJ SOLN
INTRAMUSCULAR | Status: AC
  Administered 2011-09-22: 10 mL
  Filled 2011-09-22: qty 3

## 2011-09-22 MED ORDER — HEPARIN SODIUM (PORCINE) 1000 UNIT/ML DIALYSIS
300.0000 [IU] | INTRAMUSCULAR | Status: DC | PRN
  Administered 2011-09-23: 300 [IU] via INTRAVENOUS_CENTRAL
  Filled 2011-09-22: qty 1

## 2011-09-22 MED ORDER — SODIUM CHLORIDE 0.9 % IJ SOLN
INTRAMUSCULAR | Status: AC
  Administered 2011-09-22: 3 mL
  Filled 2011-09-22: qty 3

## 2011-09-22 MED ORDER — HEPARIN SODIUM (PORCINE) 1000 UNIT/ML DIALYSIS
20.0000 [IU]/kg | INTRAMUSCULAR | Status: DC | PRN
  Administered 2011-09-23: 1300 [IU] via INTRAVENOUS_CENTRAL
  Filled 2011-09-22: qty 2

## 2011-09-22 NOTE — Progress Notes (Signed)
Subjective: Interval History: has no complaint of nausea or vomiting. She denies any difficulty in breathing. Presently patient also says that she's feeling better and no difficulty in swallowing..  Objective: Vital signs in last 24 hours: Temp:  [98.2 F (36.8 C)-98.7 F (37.1 C)] 98.7 F (37.1 C) (07/23 0548) Pulse Rate:  [20-83] 69  (07/23 0548) Resp:  [18-24] 20  (07/23 0548) BP: (112-172)/(69-98) 145/75 mmHg (07/23 0548) SpO2:  [93 %-99 %] 97 % (07/23 0548) Weight:  [62.9 kg (138 lb 10.7 oz)-66.5 kg (146 lb 9.7 oz)] 63.095 kg (139 lb 1.6 oz) (07/23 0548) Weight change: 0.003 kg (0.1 oz)  Intake/Output from previous day: 07/22 0701 - 07/23 0700 In: 600 [P.O.:600] Out: 2775  Intake/Output this shift:    General appearance: alert, cooperative and no distress Neck: no adenopathy, no carotid bruit, no JVD and supple, symmetrical, trachea midline Resp: clear to auscultation bilaterally Cardio: regular rate and rhythm, S1, S2 normal, no murmur, click, rub or gallop GI: soft, non-tender; bowel sounds normal; no masses,  no organomegaly Extremities: extremities normal, atraumatic, no cyanosis or edema  Lab Results:  Basename 09/22/11 0508 09/21/11 1050  WBC 3.2* 2.0*  HGB 8.6* 8.1*  HCT 27.1* 25.6*  PLT 130* 111*   BMET:  Basename 09/22/11 0508 09/21/11 1050  NA 139 138  K 5.1 4.9  CL 100 99  CO2 29 27  GLUCOSE 123* 112*  BUN 30* 33*  CREATININE 7.79* 10.89*  CALCIUM 9.1 8.1*   No results found for this basename: PTH:2 in the last 72 hours Iron Studies: No results found for this basename: IRON,TIBC,TRANSFERRIN,FERRITIN in the last 72 hours  Studies/Results: Dg Neck Soft Tissue  09/21/2011  *RADIOLOGY REPORT*  Clinical Data: And wheezing  NECK SOFT TISSUES - 1+ VIEW  Comparison: none  Findings: There is prevertebral soft tissue swelling anterior to the C2 through C6 vertebral bodies measuring up to 2 cm.  No retropharyngeal gas is present.  The glottis is normal.  No  acute bony abnormality.  IMPRESSION: Prevertebral soft tissue swelling from C2 through C6.  The findings discussed Dr. Monia Pouch on 09/20/2011 at 2200 hours  Original Report Authenticated By: Suzy Bouchard, M.D.   Dg Chest 2 View  09/21/2011  *RADIOLOGY REPORT*  Clinical Data: Angioedema  CHEST - 2 VIEW  Comparison: None available  Findings: Enlarged heart silhouette.  There is perihilar fullness. No comparison available. There is nodularity in left or right lung which may represent granulomas disease.  Lymphadenectomy clips in the right axilla.  IMPRESSION:  1.  Perihilar fullness on the right and left.  No comparison. 2.  Nodularity may represent granulomatous disease. 3.  If no chest x-ray or CT comparison, recommend non emergent CT thorax.  Findings discussed  with Dr. Monia Pouch on 09/20/2011 at 22:10 hours  Original Report Authenticated By: Suzy Bouchard, M.D.    I have reviewed the patient's current medications.  Assessment/Plan: Problem #1 partial swelling and puffiness of her eyelids at this moment seems to be improving. She is still some swelling on the right side of her face. In general her face is puffy. Problem #2 end-stage renal disease she status post hemodialysis yesterday her BUN and creatinine was in acceptable range normal potassium Problem #3 history of hypertension her blood pressure seems to be reasonably controlled Problem #4 history of HIV-positive Problem #5 history of GERD Problem #6 history of CHF she status post hemodialysis yesterday clinically no sign of fluid overload. Problem #7 history of hyperphosphatemia patient  on binder. Plan: We'll make arrangements for patient to get dialysis tomorrow We'll continue his other medications as before.    LOS: 1 day   Eryck Negron S 09/22/2011,8:09 AM

## 2011-09-22 NOTE — Progress Notes (Signed)
[facility] PROGRESS NOTE  [name] [id number] DOB: Mar 26, 1950 DOA: 09/21/2011 PCP: No primary provider on file.  Assessment/Plan: Principal Problem:  *Angioedema of lips Active Problems:  HUMAN IMMUNODEFICIENCY VIRUS [HIV]  ANEMIA, NORMOCYTIC, CHRONIC  HYPERTENSION  End stage renal disease  1. Facial swelling. It is felt that patient may be developing an allergic angioedema. She was started on steroids. She is not on an ACE inhibitor. Pharmacy review of her medications indicate that gabapentin can sometimes cause angioedema. This has been discontinued. Dr. Lowanda Foster is following the patient and feels that much of this facial swelling may be due to a volume overload situation. The patient was dialyzed yesterday and it appears that her facial swelling is improving. She is continued on steroids. 2. End-stage renal disease on hemodialysis. Patient is noncompliant. She underwent dialysis yesterday and further plans for dialysis tomorrow.  3. Anemia of chronic disease, stable 4. Hypertension, stable 5. Disposition patient can be discharged back to skilled nursing facility once her facial swelling is further orders.  Code Status: Full code Family Communication: Discussed with patient Disposition Plan: Discharge back to skilled nursing facility when stable   Brief narrative: This lady was admitted to the hospital with facial swelling. It was felt that she may be having an allergic angioedema reaction. She was admitted for IV steroids. Nephrology has been following for her dialysis needs.  Consultants:  Nephrology, Dr. Lowanda Foster  Procedures:  Hemodialysis  Antibiotics:  None  HPI/Subjective: She feels slightly better today. No nausea or vomiting. No shortness of breath or difficulty swallowing.  Objective: Filed Vitals:   09/21/11 1643 09/21/11 1709 09/21/11 2137 09/22/11 0548  BP: 167/91 148/83 112/73 145/75  Pulse: 83 78 20 69  Temp: 98.6 F (37 C) 98.2 F  (36.8 C) 98.3 F (36.8 C) 98.7 F (37.1 C)  TempSrc: Oral Oral Oral Oral  Resp: 24 20 20 20   Weight: 62.9 kg (138 lb 10.7 oz)   63.095 kg (139 lb 1.6 oz)  SpO2: 93% 94% 99% 97%    Intake/Output Summary (Last 24 hours) at 09/22/11 1122 Last data filed at 09/22/11 0800  Gross per 24 hour  Intake    600 ml  Output   2775 ml  Net  -2175 ml    Exam:   General:  No signs of distress. Facial swelling appears to be mildly improved although still present.  Cardiovascular: S1, S2, regular rate and rhythm  Respiratory: Clear to auscultation bilaterally  Abdomen: Soft, nontender, nondistended, bowel sounds are active  Data Reviewed: Basic Metabolic Panel:  Lab [id number] 0508 09/21/11 1050 09/21/11 0500 09/20/11 2040  NA 139 138 137 135  K 5.1 4.9 5.3* 3.6  CL 100 99 99 97  CO2 29 27 27 27   GLUCOSE 123* 112* 135* 90  BUN 30* 33* 29* 24*  CREATININE 7.79* 10.89* 10.36* 9.51*  CALCIUM 9.1 8.1* 8.2* 8.5  MG -- -- -- --  PHOS 6.6* -- -- --   Liver Function Tests:  Lab 09/20/11 2040  AST 19  ALT 7  ALKPHOS 79  BILITOT 0.3  PROT 9.4*  ALBUMIN 3.0*   No results found for this basename: LIPASE:5,AMYLASE:5 in the last 168 hours No results found for this basename: AMMONIA:5 in the last 168 hours CBC:  Lab 09/22/11 0508 09/21/11 1050 09/21/11 0500 09/20/11 2040  WBC 3.2* 2.0* 1.8* 2.3*  NEUTROABS -- -- 1.5* 1.3*  HGB 8.6* 8.1* 7.9* 8.5*  HCT 27.1* 25.6* 25.2* 27.2*  MCV 104.6* 104.9*  104.6* 105.4*  PLT 130* 111* 106* 105*   Cardiac Enzymes: No results found for this basename: CKTOTAL:5,CKMB:5,CKMBINDEX:5,TROPONINI:5 in the last 168 hours BNP (last 3 results)  Basename 03/09/11 1503  PROBNP 3673.0*   CBG: No results found for this basename: GLUCAP:5 in the last 168 hours  Recent Results (from the past 240 hour(s))  MRSA PCR SCREENING     Status: Normal   Collection Time   09/21/11  2:25 AM      Component Value Range Status Comment   MRSA by PCR NEGATIVE  NEGATIVE  Final      Studies: Dg Neck Soft Tissue  09/21/2011  *RADIOLOGY REPORT*  Clinical Data: And wheezing  NECK SOFT TISSUES - 1+ VIEW  Comparison: none  Findings: There is prevertebral soft tissue swelling anterior to the C2 through C6 vertebral bodies measuring up to 2 cm.  No retropharyngeal gas is present.  The glottis is normal.  No acute bony abnormality.  IMPRESSION: Prevertebral soft tissue swelling from C2 through C6.  The findings discussed Dr. Monia Pouch on 09/20/2011 at 2200 hours  Original Report Authenticated By: Suzy Bouchard, M.D.   Dg Chest 2 View  09/21/2011  *RADIOLOGY REPORT*  Clinical Data: Angioedema  CHEST - 2 VIEW  Comparison: None available  Findings: Enlarged heart silhouette.  There is perihilar fullness. No comparison available. There is nodularity in left or right lung which may represent granulomas disease.  Lymphadenectomy clips in the right axilla.  IMPRESSION:  1.  Perihilar fullness on the right and left.  No comparison. 2.  Nodularity may represent granulomatous disease. 3.  If no chest x-ray or CT comparison, recommend non emergent CT thorax.  Findings discussed  with Dr. Monia Pouch on 09/20/2011 at 22:10 hours  Original Report Authenticated By: Suzy Bouchard, M.D.    Scheduled Meds:   . cinacalcet  30 mg Oral Q supper  . diphenhydrAMINE  25 mg Oral TID  . epoetin alfa  10,000 Units Intravenous 3 times weekly  . fentaNYL  25 mcg Transdermal Q48H  . lamiVUDine  50 mg Oral Daily  . lanthanum  1,000 mg Oral TID WC  . methylPREDNISolone (SOLU-MEDROL) injection  80 mg Intravenous Q12H  . multivitamin  1 tablet Oral Daily  . pantoprazole  40 mg Oral BID AC  . polyvinyl alcohol  1 drop Both Eyes BID  . raltegravir  400 mg Oral BID  . sildenafil  20 mg Oral TID  . sodium chloride      . sodium chloride      . zidovudine  100 mg Oral TID  . DISCONTD: gabapentin  300 mg Oral Q12H  . DISCONTD: hydroxypropyl methylcellulose  1 drop Both Eyes BID   Continuous  Infusions:   Principal Problem:  *Angioedema of lips Active Problems:  HUMAN IMMUNODEFICIENCY VIRUS [HIV]  ANEMIA, NORMOCYTIC, CHRONIC  HYPERTENSION  End stage renal disease    Time spent: 74mins    [name],Tina  [facility] Pager [phone]. If 7PM-7AM, please contact night-coverage at [email or web address], password [facility] 09/22/2011, 11:22 AM  LOS: 1 day

## 2011-09-22 NOTE — Progress Notes (Signed)
UR Chart Review Completed  

## 2011-09-23 ENCOUNTER — Observation Stay (HOSPITAL_COMMUNITY): Payer: Medicare Other

## 2011-09-23 DIAGNOSIS — N186 End stage renal disease: Secondary | ICD-10-CM | POA: Diagnosis not present

## 2011-09-23 DIAGNOSIS — Z21 Asymptomatic human immunodeficiency virus [HIV] infection status: Secondary | ICD-10-CM | POA: Diagnosis not present

## 2011-09-23 DIAGNOSIS — F329 Major depressive disorder, single episode, unspecified: Secondary | ICD-10-CM | POA: Diagnosis not present

## 2011-09-23 DIAGNOSIS — F3289 Other specified depressive episodes: Secondary | ICD-10-CM

## 2011-09-23 DIAGNOSIS — D631 Anemia in chronic kidney disease: Secondary | ICD-10-CM | POA: Diagnosis not present

## 2011-09-23 DIAGNOSIS — E8770 Fluid overload, unspecified: Secondary | ICD-10-CM | POA: Diagnosis present

## 2011-09-23 DIAGNOSIS — T783XXA Angioneurotic edema, initial encounter: Secondary | ICD-10-CM | POA: Diagnosis not present

## 2011-09-23 LAB — BASIC METABOLIC PANEL
CO2: 26 mEq/L (ref 19–32)
Chloride: 98 mEq/L (ref 96–112)
GFR calc Af Amer: 4 mL/min — ABNORMAL LOW (ref >90)
Potassium: 5 mEq/L (ref 3.5–5.1)

## 2011-09-23 LAB — CBC
HCT: 28.5 % — ABNORMAL LOW (ref 36.0–46.0)
Hemoglobin: 8.9 g/dL — ABNORMAL LOW (ref 12.0–15.0)
MCV: 103.6 fL — ABNORMAL HIGH (ref 78.0–100.0)
WBC: 3.6 10*3/uL — ABNORMAL LOW (ref 4.0–10.5)

## 2011-09-23 MED ORDER — PREDNISONE 20 MG PO TABS: ORAL_TABLET | ORAL | Status: AC

## 2011-09-23 MED ORDER — SODIUM CHLORIDE 0.9 % IJ SOLN
3.0000 mL | Freq: Two times a day (BID) | INTRAMUSCULAR | Status: DC
  Administered 2011-09-23 – 2011-09-24 (×2): 3 mL via INTRAVENOUS
  Filled 2011-09-23 (×2): qty 3

## 2011-09-23 NOTE — Discharge Summary (Signed)
Physician Discharge Summary  [name] [id number] DOB: 22-Sep-1950 DOA: 09/21/2011  PCP: No primary provider on file.  Admit date: 09/21/2011 Discharge date: 09/23/2011  Recommendations for Outpatient Follow-up:  1. Discharge back to skilled nursing facility 2. Continue dialysis per schedule per Dr. Lowanda Foster  Discharge Diagnoses:  Principal Problem:  *Angioedema of lips Active Problems:  HUMAN IMMUNODEFICIENCY VIRUS [HIV]  ANEMIA, NORMOCYTIC, CHRONIC  HYPERTENSION  End stage renal disease  Volume overload   Discharge Condition: Improved  Diet recommendation: Renal diet  History of present illness:  61 yo female esrd on dialysi who comes in from snf for tongue, lip swellihgn that started earlier tonight. Pt deneis any new meds. No fevers. No recent illnesses. Is hiv pos but denies again any new meds. Not on ace inh. No rashes. She has received tx in ed and has decreased swelling in her tongue and lips. Denies any sob.   Hospital Course:  This lady was admitted to the hospital from the nursing home for facial swelling. It was felt that she may be suffering from an allergic angioedema due to swelling in her lips, tongue, face and neck. She was started on empiric steroids. She was not taking any ACE inhibitor prior to admission. She is a dialysis patient who is followed by Dr. Lowanda Foster. Patient was followed by nephrology and underwent dialysis. According to Dr. Lowanda Foster, he feels that her facial swelling may be secondary to volume overload. Patient has been noncompliant with her diet as well as her dialysis sessions. He felt that one week prior to admission, patient had facial swelling that was worse than what was present on admission. She continued on hemodialysis and per hospital course her facial swelling has improved. Pharmacy reviewed her medications and found that only gabapentin is related to possible angioedema. That has since been discontinued. Patient's steroids have been  changed to prednisone for another 5 days. She'll continue on dialysis per schedule. We'll plan on transfer back to the skilled nursing facility tomorrow if no further workup from nephrology is needed.  Procedures:  Hemodialysis  Consultations:  Nephrology  Discharge Exam: Filed Vitals:   09/23/11 1559  BP: 132/76  Pulse: 66  Temp: 98.2 F (36.8 C)  Resp: 18   Filed Vitals:   09/23/11 1500 09/23/11 1530 09/23/11 1544 09/23/11 1559  BP: 148/82 136/82 135/83 132/76  Pulse: 66 71 66 66  Temp:    98.2 F (36.8 C)  TempSrc:    Oral  Resp:    18  Height:      Weight:    59.3 kg (130 lb 11.7 oz)  SpO2:    99%   General: No acute distress, lying comfortably in bed, patient edema appears to be improving Cardiovascular: S1, S2, regular rate and rhythm Respiratory: Clear to auscultation bilaterally  Discharge Instructions   Medication List  As of 09/23/2011  7:54 PM   STOP taking these medications         gabapentin 300 MG capsule         TAKE these medications         acetaminophen 325 MG tablet   Commonly known as: TYLENOL   Take 650 mg by mouth every 4 (four) hours as needed. For mild pain or fever      b complex-vitamin c-folic acid 0.8 MG Tabs   Take 0.8 mg by mouth at bedtime.      BIOFREEZE EX   Apply 1 application topically 4 (four) times daily as needed.  For pain  **do not apply near dialysis site**      cinacalcet 30 MG tablet   Commonly known as: SENSIPAR   Take 30 mg by mouth daily with supper.      EPOGEN 10000 UNIT/ML injection   Generic drug: epoetin alfa   Inject 8,000 Units into the skin. To be given at dialysis      fentaNYL 25 MCG/HR   Commonly known as: Ipava - dosed mcg/hr   Place 1 patch onto the skin every other day. Rotate sites      hydroxypropyl methylcellulose 2.5 % ophthalmic solution   Commonly known as: ISOPTO TEARS   Place 1 drop into both eyes 2 (two) times daily.      ipratropium-albuterol 0.5-2.5 (3) MG/3ML Soln    Commonly known as: DUONEB   Take 3 mLs by nebulization every 6 (six) hours as needed. For shortness of breath/ wheezing      lamivudine 100 MG tablet   Commonly known as: EPIVIR   Take 0.5 tablets (50 mg total) by mouth daily. Take one half tablet daily      lanthanum 500 MG chewable tablet   Commonly known as: FOSRENOL   Chew 500-1,500 mg by mouth 4 (four) times daily -  with meals and at bedtime. Take 1500 mg three times daily at meals and 500 mg at bedtime      lidocaine 5 %   Commonly known as: LIDODERM   Place 1 patch onto the skin daily. Remove & Discard patch within 12 hours or as directed by MD      lidocaine-prilocaine cream   Commonly known as: EMLA   Apply 1 application topically as needed. Apply 1 hour prior to dialysis access site      LORazepam 0.5 MG tablet   Commonly known as: ATIVAN   Take 0.125 mg by mouth every 6 (six) hours as needed. Take 1/4 of a tablet For increased anxiety      ondansetron 4 MG tablet   Commonly known as: ZOFRAN   Take 2 mg by mouth every 4 (four) hours as needed. nausea      oxyCODONE 5 MG immediate release tablet   Commonly known as: Oxy IR/ROXICODONE   Take 5 mg by mouth at bedtime.      oxyCODONE 5 MG immediate release tablet   Commonly known as: Oxy IR/ROXICODONE   Take 5 mg by mouth every 6 (six) hours as needed. For pain      pantoprazole 40 MG tablet   Commonly known as: PROTONIX   Take 1 tablet (40 mg total) by mouth 2 (two) times daily before a meal.      predniSONE 20 MG tablet   Commonly known as: DELTASONE   Take 40mg  po daily for 5 days then stop      raltegravir 400 MG tablet   Commonly known as: ISENTRESS   Take 400 mg by mouth 2 (two) times daily.      sildenafil 20 MG tablet   Commonly known as: REVATIO   Take 20 mg by mouth 3 (three) times daily.      vitamin C 500 MG tablet   Commonly known as: ASCORBIC ACID   Take 500 mg by mouth 2 (two) times daily.      Whey Protein Isolate Powd   Take 1 Package by  mouth 2 (two) times daily. Mix With vanilla pudding      zidovudine 100 MG capsule   Commonly known  as: RETROVIR   Take 100 mg by mouth 3 (three) times daily.              The results of significant diagnostics from this hospitalization (including imaging, microbiology, ancillary and laboratory) are listed below for reference.    Significant Diagnostic Studies: Dg Neck Soft Tissue  09/21/2011  *RADIOLOGY REPORT*  Clinical Data: And wheezing  NECK SOFT TISSUES - 1+ VIEW  Comparison: none  Findings: There is prevertebral soft tissue swelling anterior to the C2 through C6 vertebral bodies measuring up to 2 cm.  No retropharyngeal gas is present.  The glottis is normal.  No acute bony abnormality.  IMPRESSION: Prevertebral soft tissue swelling from C2 through C6.  The findings discussed Dr. Monia Pouch on 09/20/2011 at 2200 hours  Original Report Authenticated By: Suzy Bouchard, M.D.   Dg Chest 2 View  09/21/2011  *RADIOLOGY REPORT*  Clinical Data: Angioedema  CHEST - 2 VIEW  Comparison: None available  Findings: Enlarged heart silhouette.  There is perihilar fullness. No comparison available. There is nodularity in left or right lung which may represent granulomas disease.  Lymphadenectomy clips in the right axilla.  IMPRESSION:  1.  Perihilar fullness on the right and left.  No comparison. 2.  Nodularity may represent granulomatous disease. 3.  If no chest x-ray or CT comparison, recommend non emergent CT thorax.  Findings discussed  with Dr. Monia Pouch on 09/20/2011 at 22:10 hours  Original Report Authenticated By: Suzy Bouchard, M.D.    Microbiology: Recent Results (from the past 240 hour(s))  MRSA PCR SCREENING     Status: Normal   Collection Time   09/21/11  2:25 AM      Component Value Range Status Comment   MRSA by PCR NEGATIVE  NEGATIVE Final      Labs: Basic Metabolic Panel:  Lab [id number] 09/22/11 0508 09/21/11 1050 09/21/11 0500 09/20/11 2040  NA 138 139 138 137 135  K  5.0 5.1 4.9 5.3* 3.6  CL 98 100 99 99 97  CO2 26 29 27 27 27   GLUCOSE 112* 123* 112* 135* 90  BUN 58* 30* 33* 29* 24*  CREATININE 10.74* 7.79* 10.89* 10.36* 9.51*  CALCIUM 8.3* 9.1 8.1* 8.2* 8.5  MG -- -- -- -- --  PHOS -- 6.6* -- -- --   Liver Function Tests:  Lab 09/20/11 2040  AST 19  ALT 7  ALKPHOS 79  BILITOT 0.3  PROT 9.4*  ALBUMIN 3.0*   No results found for this basename: LIPASE:5,AMYLASE:5 in the last 168 hours No results found for this basename: AMMONIA:5 in the last 168 hours CBC:  Lab 09/23/11 0603 09/22/11 0508 09/21/11 1050 09/21/11 0500 09/20/11 2040  WBC 3.6* 3.2* 2.0* 1.8* 2.3*  NEUTROABS -- -- -- 1.5* 1.3*  HGB 8.9* 8.6* 8.1* 7.9* 8.5*  HCT 28.5* 27.1* 25.6* 25.2* 27.2*  MCV 103.6* 104.6* 104.9* 104.6* 105.4*  PLT 132* 130* 111* 106* 105*   Cardiac Enzymes: No results found for this basename: CKTOTAL:5,CKMB:5,CKMBINDEX:5,TROPONINI:5 in the last 168 hours BNP: BNP (last 3 results)  Basename 03/09/11 1503  PROBNP 3673.0*   CBG: No results found for this basename: GLUCAP:5 in the last 168 hours  Time coordinating discharge: 8mins  Signed:  [name],[name]  [facility] 09/23/2011, 7:54 PM

## 2011-09-23 NOTE — Progress Notes (Signed)
[name]  MRN: [id number]  DOB/AGE: 05/21/1950 61 y.o.  Primary Care Physician:No primary provider on file.  Admit date: 09/21/2011  Chief Complaint: No chief complaint on file.   S-Pt today feels better.Pt offers No new complaints.Pt seen on dialysis-pt tolerating tx well.   Meds    . cinacalcet  30 mg Oral Q supper  . diphenhydrAMINE  25 mg Oral TID  . epoetin alfa  10,000 Units Intravenous 3 times weekly  . fentaNYL  25 mcg Transdermal Q48H  . lamiVUDine  50 mg Oral Daily  . lanthanum  1,000 mg Oral TID WC  . methylPREDNISolone (SOLU-MEDROL) injection  80 mg Intravenous Q12H  . multivitamin  1 tablet Oral Daily  . pantoprazole  40 mg Oral BID AC  . polyvinyl alcohol  1 drop Both Eyes BID  . raltegravir  400 mg Oral BID  . sildenafil  20 mg Oral TID  . sodium chloride      . sodium chloride      . sodium chloride      . zidovudine  100 mg Oral TID       Physical Exam: Vital signs in last 24 hours: Temp:  [98.2 F (36.8 C)-98.7 F (37.1 C)] 98.2 F (36.8 C) (07/24 0538) Pulse Rate:  [70-80] 78  (07/24 0855) Resp:  [20] 20  (07/24 0538) BP: (117-133)/(70-77) 133/70 mmHg (07/24 0538) SpO2:  [95 %-100 %] 96 % (07/24 0855) Weight:  [136 lb 9.6 oz (61.961 kg)] 136 lb 9.6 oz (61.961 kg) (07/24 0538) Weight change: -10 lb 0.1 oz (-4.539 kg) Last BM Date: 09/19/11  Intake/Output from previous day: 07/23 0701 - 07/24 0700 In: 480 [P.O.:480] Out: -  Total I/O In: 240 [P.O.:240] Out: -    Physical Exam: General- pt is awake,alert, oriented to time place and person Resp- No acute REsp distress, CTA B/L NO Rhonchi CVS- S1S2 regular in rate and rhythm GIT- BS+, soft, NT, ND EXT- NO LE Edema, Cyanosis   Lab Results: CBC  Basename 09/23/11 0603 09/22/11 0508  WBC 3.6* 3.2*  HGB 8.9* 8.6*  HCT 28.5* 27.1*  PLT 132* 130*    BMET  Basename 09/23/11 0603 09/22/11 0508  NA 138 139  K 5.0 5.1  CL 98 100  CO2 26 29  GLUCOSE 112* 123*  BUN 58*  30*  CREATININE 10.74* 7.79*  CALCIUM 8.3* 9.1    MICRO Recent Results (from the past 240 hour(s))  MRSA PCR SCREENING     Status: Normal   Collection Time   09/21/11  2:25 AM      Component Value Range Status Comment   MRSA by PCR NEGATIVE  NEGATIVE Final       Lab Results  Component Value Date   PTH 364.8* 03/12/2011   CALCIUM 8.3* 09/23/2011   PHOS 6.6* 09/22/2011         Impression: 1)Renal ESRD on HD ON MWF schedule Pt is being dialyzed today   2)HTN  BP at goal  3)Anemia HGb NOT at goal (9--11) On EPO during HD  4)CKD Mineral-Bone Disorder PTH acceptable. Secondary Hyperparathyroidism - present - on sensipar Phosphorus not at goal- on binders.   5)Facial swelling Fluid overload vs Angioedema Improving with steroids vs dialysis   6)FEN  Normokalemic NOrmonatremic   7)Acid base Co2 at goal  8) HIV- ON HAART   Plan:  Will continue current care      Echo S 09/23/2011, 9:45 AM

## 2011-09-24 DIAGNOSIS — N186 End stage renal disease: Secondary | ICD-10-CM | POA: Diagnosis not present

## 2011-09-24 NOTE — Clinical Social Work Note (Signed)
Pt d/c today by MD back to [facility]. Pt and facility are aware and agreeable. Pt to transfer via [facility]. D/C summary faxed.   [name], [place]

## 2011-09-24 NOTE — Progress Notes (Signed)
Subjective: Interval History: has no complaint of  difficulty in breathing. Patient presently offers no complaints. She does have any nausea vomiting overall seems to be feeling better..  Objective: Vital signs in last 24 hours: Temp:  [98.2 F (36.8 C)-98.7 F (37.1 C)] 98.3 F (36.8 C) (07/25 0532) Pulse Rate:  [63-81] 81  (07/25 0532) Resp:  [18] 18  (07/25 0532) BP: (128-169)/(71-101) 142/88 mmHg (07/25 0532) SpO2:  [96 %-99 %] 98 % (07/25 0532) Weight:  [59.3 kg (130 lb 11.7 oz)-61.7 kg (136 lb 0.4 oz)] 60.5 kg (133 lb 6.1 oz) (07/25 0532) Weight change: -0.261 kg (-9.2 oz)  Intake/Output from previous day: 07/24 0701 - 07/25 0700 In: 360 [P.O.:360] Out: 2200  Intake/Output this shift: Total I/O In: 200 [P.O.:200] Out: -   General appearance: alert, cooperative and no distress Resp: clear to auscultation bilaterally Cardio: regular rate and rhythm, S1, S2 normal, no murmur, click, rub or gallop GI: soft, non-tender; bowel sounds normal; no masses,  no organomegaly Extremities: extremities normal, atraumatic, no cyanosis or edema  Lab Results:  [facility] 09/23/11 0603 09/22/11 0508  WBC 3.6* 3.2*  HGB 8.9* 8.6*  HCT 28.5* 27.1*  PLT 132* 130*   BMET:  Basename 09/23/11 0603 09/22/11 0508  NA 138 139  K 5.0 5.1  CL 98 100  CO2 26 29  GLUCOSE 112* 123*  BUN 58* 30*  CREATININE 10.74* 7.79*  CALCIUM 8.3* 9.1   No results found for this basename: PTH:2 in the last 72 hours Iron Studies: No results found for this basename: IRON,TIBC,TRANSFERRIN,FERRITIN in the last 72 hours  Studies/Results: No results found.  I have reviewed the patient's current medications.  Assessment/Plan: Problem 1 end-stage renal disease she status post hemodialysis yesterday her BUN is 50 creatinine is 10.74 with potassium of 5 stable. Problem #2 anemia her hemoglobin is 8.9 hematocrit 28.5 stable Problem #3 history of facial swelling has this moment seems to be better. Etiology as  this moment is not clear to be a combination of fluid overload, angioedema. Her Problem #4 history of HIV-positive Problem #5 history of hypertension her blood pressure seems to be controlled very well Problem #6 history of hyperphosphatemia she is on a binder. Plan: We'll make arrangements for patient to get dialysis tomorrow and then she'll be put on her regular schedule.   LOS: 3 days   Anvi Mangal S 09/24/2011,10:01 AM

## 2011-09-24 NOTE — Progress Notes (Signed)
Patient d/c back to [facility] Left floor via wheelchair accompanied by staff Transported by [facility] Report called to Ebony Hail Nurse at [facility] D/c packet sent with patient [name]

## 2011-09-25 DIAGNOSIS — M6281 Muscle weakness (generalized): Secondary | ICD-10-CM | POA: Diagnosis not present

## 2011-09-25 DIAGNOSIS — N186 End stage renal disease: Secondary | ICD-10-CM | POA: Diagnosis not present

## 2011-09-28 DIAGNOSIS — M6281 Muscle weakness (generalized): Secondary | ICD-10-CM | POA: Diagnosis not present

## 2011-09-28 DIAGNOSIS — N186 End stage renal disease: Secondary | ICD-10-CM | POA: Diagnosis not present

## 2011-09-30 DIAGNOSIS — M6281 Muscle weakness (generalized): Secondary | ICD-10-CM | POA: Diagnosis not present

## 2011-09-30 DIAGNOSIS — R609 Edema, unspecified: Secondary | ICD-10-CM | POA: Diagnosis not present

## 2011-09-30 DIAGNOSIS — N186 End stage renal disease: Secondary | ICD-10-CM | POA: Diagnosis not present

## 2011-10-01 DIAGNOSIS — N2581 Secondary hyperparathyroidism of renal origin: Secondary | ICD-10-CM | POA: Diagnosis not present

## 2011-10-01 DIAGNOSIS — D509 Iron deficiency anemia, unspecified: Secondary | ICD-10-CM | POA: Diagnosis not present

## 2011-10-01 DIAGNOSIS — N186 End stage renal disease: Secondary | ICD-10-CM | POA: Diagnosis not present

## 2011-10-01 DIAGNOSIS — D472 Monoclonal gammopathy: Secondary | ICD-10-CM | POA: Diagnosis not present

## 2011-10-01 DIAGNOSIS — D631 Anemia in chronic kidney disease: Secondary | ICD-10-CM | POA: Diagnosis not present

## 2011-10-03 DIAGNOSIS — D649 Anemia, unspecified: Secondary | ICD-10-CM | POA: Diagnosis not present

## 2011-10-03 DIAGNOSIS — Z79899 Other long term (current) drug therapy: Secondary | ICD-10-CM | POA: Diagnosis not present

## 2011-10-07 ENCOUNTER — Inpatient Hospital Stay (HOSPITAL_COMMUNITY)
Admission: EM | Admit: 2011-10-07 | Discharge: 2011-10-10 | DRG: 371 | Disposition: A | Discharge status: Skilled Nursing Facility | Payer: Medicare Other | Attending: Internal Medicine | Admitting: Internal Medicine

## 2011-10-07 ENCOUNTER — Emergency Department (HOSPITAL_COMMUNITY): Payer: Medicare Other

## 2011-10-07 ENCOUNTER — Encounter (HOSPITAL_COMMUNITY): Payer: Self-pay | Admitting: Emergency Medicine

## 2011-10-07 DIAGNOSIS — R109 Unspecified abdominal pain: Secondary | ICD-10-CM | POA: Diagnosis not present

## 2011-10-07 DIAGNOSIS — Z79899 Other long term (current) drug therapy: Secondary | ICD-10-CM

## 2011-10-07 DIAGNOSIS — K219 Gastro-esophageal reflux disease without esophagitis: Secondary | ICD-10-CM

## 2011-10-07 DIAGNOSIS — N186 End stage renal disease: Secondary | ICD-10-CM | POA: Diagnosis present

## 2011-10-07 DIAGNOSIS — B20 Human immunodeficiency virus [HIV] disease: Secondary | ICD-10-CM | POA: Diagnosis present

## 2011-10-07 DIAGNOSIS — I120 Hypertensive chronic kidney disease with stage 5 chronic kidney disease or end stage renal disease: Secondary | ICD-10-CM | POA: Diagnosis present

## 2011-10-07 DIAGNOSIS — A0472 Enterocolitis due to Clostridium difficile, not specified as recurrent: Secondary | ICD-10-CM | POA: Diagnosis not present

## 2011-10-07 DIAGNOSIS — G4700 Insomnia, unspecified: Secondary | ICD-10-CM | POA: Diagnosis present

## 2011-10-07 DIAGNOSIS — Z992 Dependence on renal dialysis: Secondary | ICD-10-CM | POA: Diagnosis not present

## 2011-10-07 DIAGNOSIS — R197 Diarrhea, unspecified: Secondary | ICD-10-CM | POA: Diagnosis not present

## 2011-10-07 DIAGNOSIS — I2789 Other specified pulmonary heart diseases: Secondary | ICD-10-CM | POA: Diagnosis present

## 2011-10-07 DIAGNOSIS — F3289 Other specified depressive episodes: Secondary | ICD-10-CM | POA: Diagnosis present

## 2011-10-07 DIAGNOSIS — N039 Chronic nephritic syndrome with unspecified morphologic changes: Secondary | ICD-10-CM | POA: Diagnosis not present

## 2011-10-07 DIAGNOSIS — Z91040 Latex allergy status: Secondary | ICD-10-CM

## 2011-10-07 DIAGNOSIS — M2550 Pain in unspecified joint: Secondary | ICD-10-CM | POA: Diagnosis not present

## 2011-10-07 DIAGNOSIS — N2581 Secondary hyperparathyroidism of renal origin: Secondary | ICD-10-CM | POA: Diagnosis present

## 2011-10-07 DIAGNOSIS — Z880 Allergy status to penicillin: Secondary | ICD-10-CM

## 2011-10-07 DIAGNOSIS — D631 Anemia in chronic kidney disease: Secondary | ICD-10-CM | POA: Diagnosis present

## 2011-10-07 DIAGNOSIS — I10 Essential (primary) hypertension: Secondary | ICD-10-CM | POA: Diagnosis not present

## 2011-10-07 DIAGNOSIS — Z21 Asymptomatic human immunodeficiency virus [HIV] infection status: Secondary | ICD-10-CM | POA: Diagnosis not present

## 2011-10-07 DIAGNOSIS — K839 Disease of biliary tract, unspecified: Secondary | ICD-10-CM | POA: Diagnosis not present

## 2011-10-07 DIAGNOSIS — D47Z9 Other specified neoplasms of uncertain behavior of lymphoid, hematopoietic and related tissue: Secondary | ICD-10-CM | POA: Diagnosis present

## 2011-10-07 DIAGNOSIS — K5289 Other specified noninfective gastroenteritis and colitis: Secondary | ICD-10-CM | POA: Diagnosis not present

## 2011-10-07 DIAGNOSIS — K529 Noninfective gastroenteritis and colitis, unspecified: Secondary | ICD-10-CM

## 2011-10-07 DIAGNOSIS — F329 Major depressive disorder, single episode, unspecified: Secondary | ICD-10-CM | POA: Diagnosis present

## 2011-10-07 DIAGNOSIS — D649 Anemia, unspecified: Secondary | ICD-10-CM | POA: Diagnosis present

## 2011-10-07 DIAGNOSIS — E46 Unspecified protein-calorie malnutrition: Secondary | ICD-10-CM | POA: Diagnosis present

## 2011-10-07 HISTORY — DX: End stage renal disease: Z99.2

## 2011-10-07 HISTORY — DX: Enterocolitis due to Clostridium difficile, not specified as recurrent: A04.72

## 2011-10-07 HISTORY — DX: Dependence on renal dialysis: N18.6

## 2011-10-07 LAB — CBC WITH DIFFERENTIAL/PLATELET
Basophils Relative: 0 % (ref 0–1)
Eosinophils Relative: 1 % (ref 0–5)
HCT: 33.5 % — ABNORMAL LOW (ref 36.0–46.0)
Hemoglobin: 10.5 g/dL — ABNORMAL LOW (ref 12.0–15.0)
Lymphs Abs: 0.7 10*3/uL (ref 0.7–4.0)
MCH: 33.4 pg (ref 26.0–34.0)
MCV: 106.7 fL — ABNORMAL HIGH (ref 78.0–100.0)
Monocytes Absolute: 0.3 10*3/uL (ref 0.1–1.0)
Neutro Abs: 3.2 10*3/uL (ref 1.7–7.7)
RBC: 3.14 MIL/uL — ABNORMAL LOW (ref 3.87–5.11)

## 2011-10-07 LAB — COMPREHENSIVE METABOLIC PANEL
Albumin: 3.3 g/dL — ABNORMAL LOW (ref 3.5–5.2)
Alkaline Phosphatase: 84 U/L (ref 39–117)
BUN: 26 mg/dL — ABNORMAL HIGH (ref 6–23)
CO2: 24 mEq/L (ref 19–32)
Chloride: 104 mEq/L (ref 96–112)
Potassium: 4.7 mEq/L (ref 3.5–5.1)
Total Bilirubin: 0.5 mg/dL (ref 0.3–1.2)

## 2011-10-07 LAB — LIPASE, BLOOD: Lipase: 38 U/L (ref 11–59)

## 2011-10-07 IMAGING — CT CT ABD-PELV W/ CM
2 of 4 series · 16 of 46 positions shown, 18 images · IV contrast (agent unspecified)
Comparison: [DATE]

CLINICAL DATA: Abdominal pain and diarrhea.

CT ABDOMEN AND PELVIS WITH CONTRAST
TECHNIQUE: Multidetector CT imaging of the abdomen and pelvis was
performed following the standard protocol during bolus
administration of intravenous contrast.
Contrast:  100 ml [77]

[Series 2: abd_pel_with 5.0 b40f · axial · 0.70mm/px · z∈[-432,-32]mm · 13 of 90 slices shown, 15 images]
[im 5/90  soft-tissue]
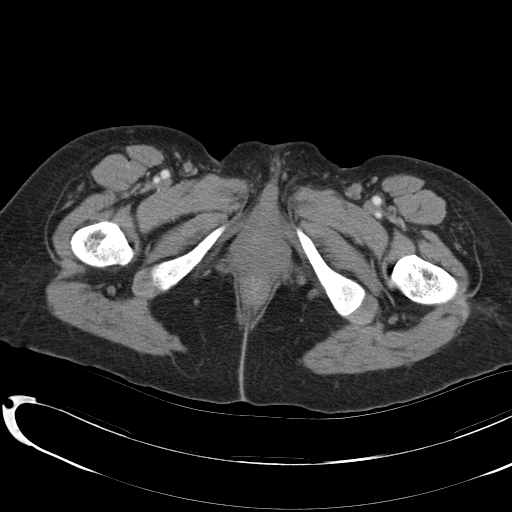
[im 5/90  bone]
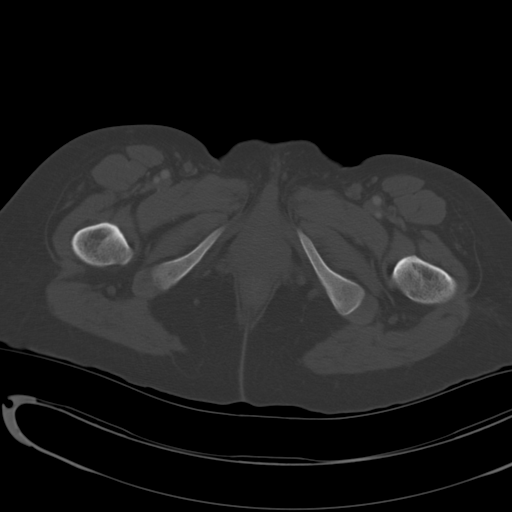
[im 14/90  soft-tissue]
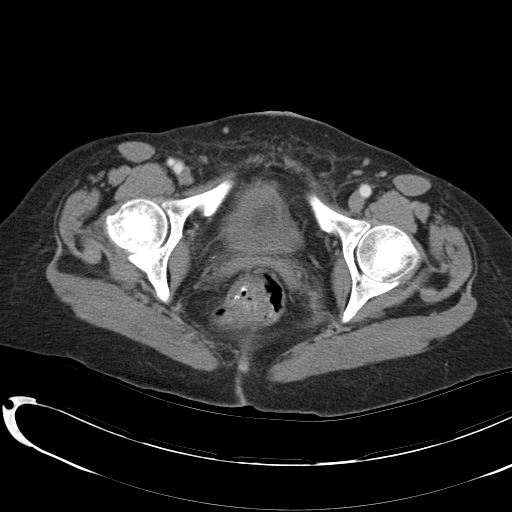
[im 18/90  soft-tissue]
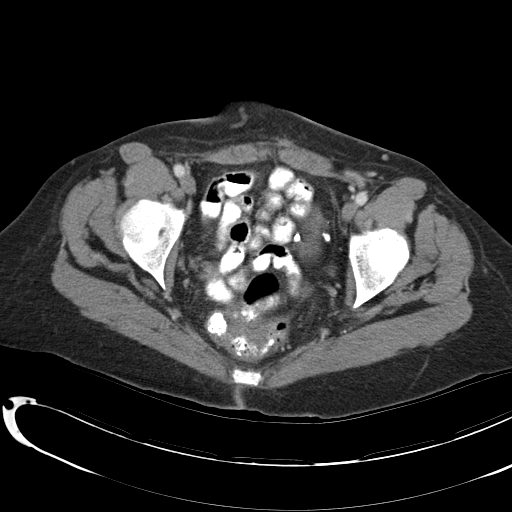
[im 27/90  soft-tissue]
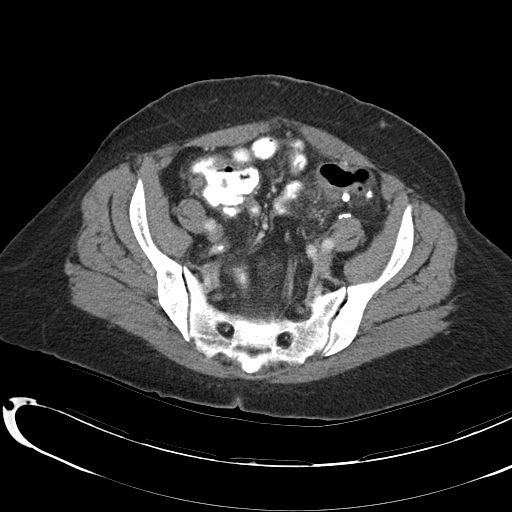
[im 32/90  soft-tissue]
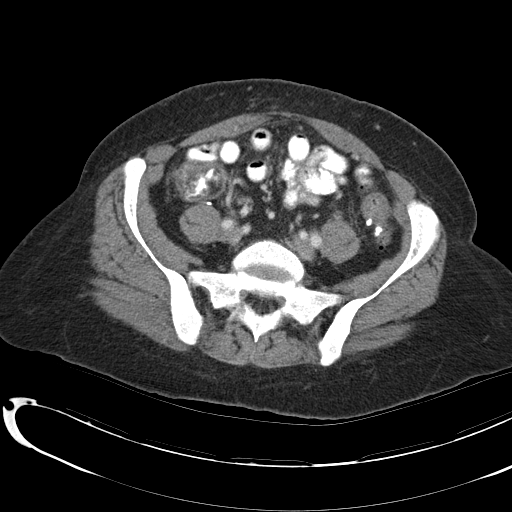
[im 41/90  soft-tissue]
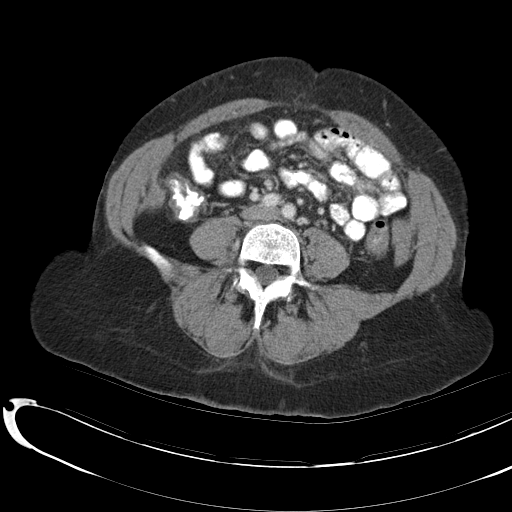
[im 45/90  soft-tissue]
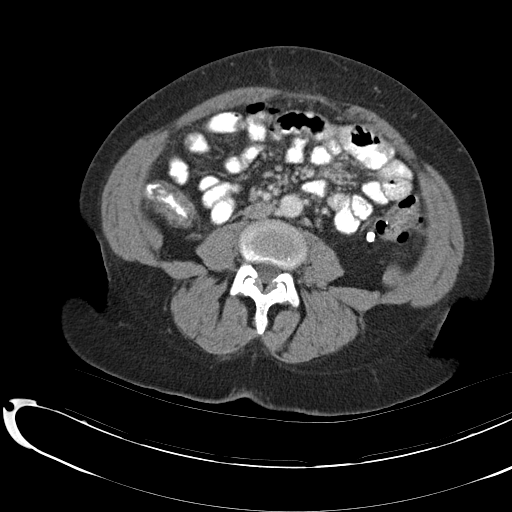
[im 49/90  soft-tissue]
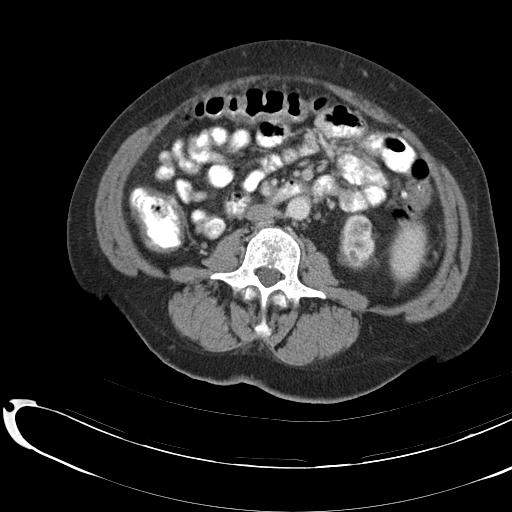
[im 58/90  soft-tissue]
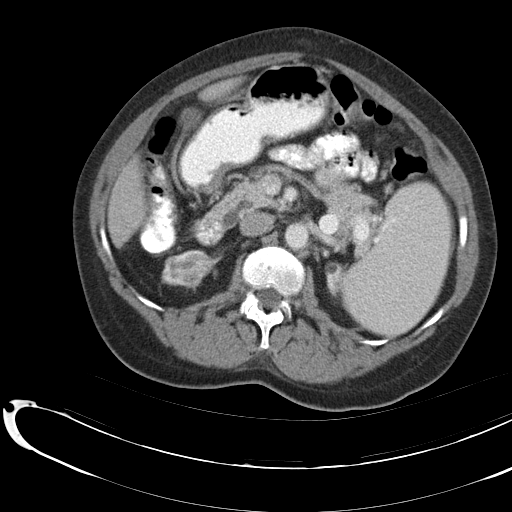
[im 58/90  bone]
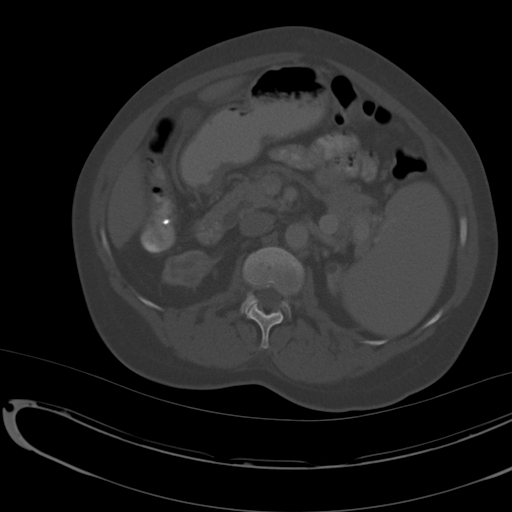
[im 63/90  soft-tissue]
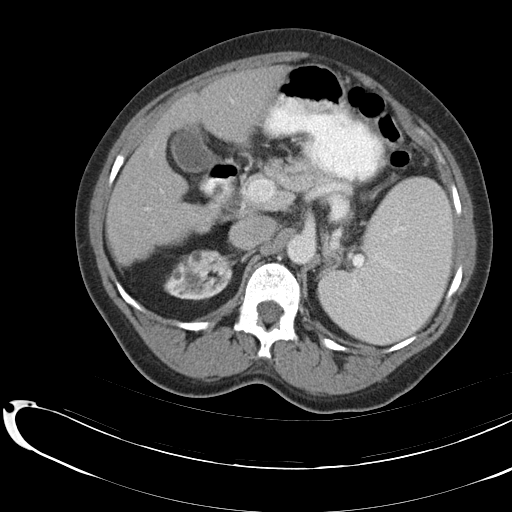
[im 72/90  soft-tissue]
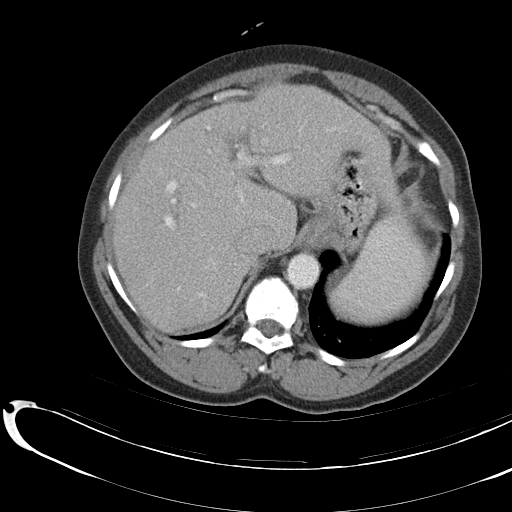
[im 76/90  soft-tissue]
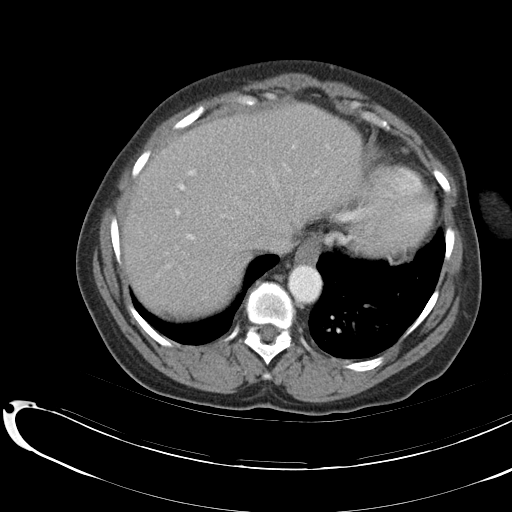
[im 85/90  soft-tissue]
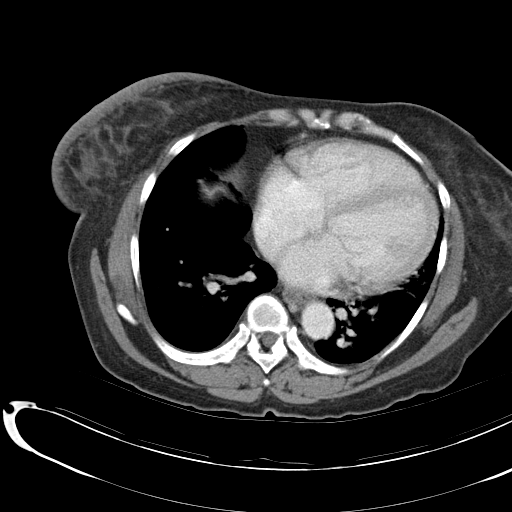

[Series 4: abd_pel_with 3.0 spo cor · coronal · 0.69mm/px · 3 of 85 slices shown]
[im 29/85  soft-tissue]
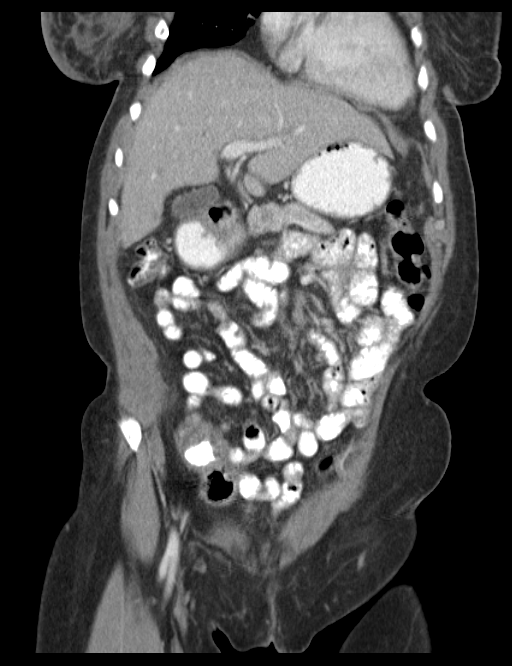
[im 38/85  soft-tissue]
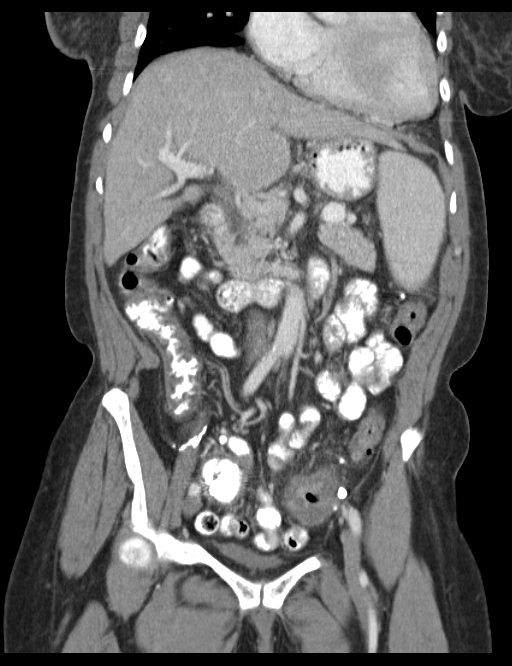
[im 47/85  soft-tissue]
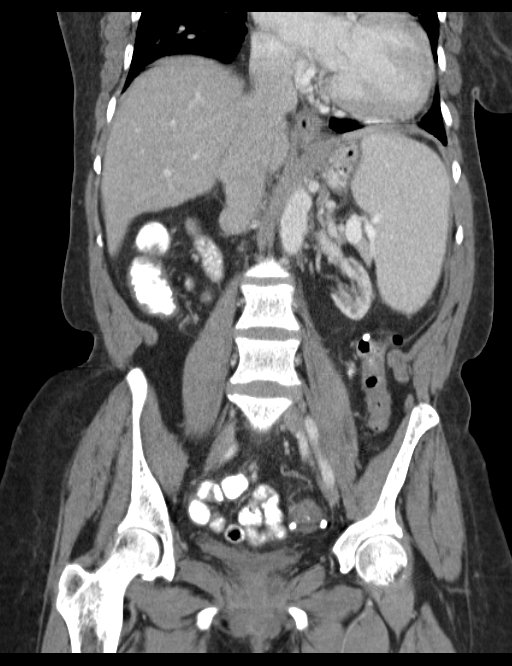

[16 of 46 positions shown; findings below may reference images not displayed]

FINDINGS: Skin thickening and soft tissue edema is identified in
the right breast.

The liver is unremarkable.  The spleen measures 12.7 cm and cranial
caudal length, borderline enlarged.  Stomach, duodenum, pancreas,
and gallbladder are unremarkable.  Common bile duct is 9 mm in
diameter which is stable in the interval.  No evidence for adrenal
mass.  Both kidneys are atrophic with innumerable bilateral tiny
hypoattenuating lesions, likely reflecting cystic the disease of
dialysis.

No abdominal aortic aneurysm.  There is no free fluid or
lymphadenopathy in the abdomen.

Imaging through the pelvis shows no free intraperitoneal fluid.
Uterus is surgically absent.  There is no adnexal mass.  The
bladder is decompressed.

Circumferential ill-defined wall thickening is seen in the left
colon, most prominently affecting the proximal sigmoid segment.
There may be some mild wall thickening in the right colon/cecum as
well.  The terminal ileum is unremarkable. The appendix is not
visualized, but there is no edema or inflammation in the region of
the cecum.

Diffuse sclerotic changes within the skeleton is compatible with a
reported history of renal disease.
IMPRESSION: Circumferential wall thickening in the left colon with potential
involving the right colon as well.  Imaging features could be
compatible with an infectious or inflammatory colitis.

Mild distention of the common bile duct is unchanged since the
prior study.

Skin thickening and some apparent soft tissue edema in the right
breast. This appearance is stable when comparing back to the prior
study and also an exam from [DATE].

## 2011-10-07 MED ORDER — METRONIDAZOLE IN NACL 5-0.79 MG/ML-% IV SOLN
500.0000 mg | Freq: Three times a day (TID) | INTRAVENOUS | Status: DC
  Administered 2011-10-08 (×2): 500 mg via INTRAVENOUS
  Filled 2011-10-07 (×5): qty 100

## 2011-10-07 MED ORDER — HEPARIN SODIUM (PORCINE) 5000 UNIT/ML IJ SOLN
5000.0000 [IU] | Freq: Three times a day (TID) | INTRAMUSCULAR | Status: DC
  Administered 2011-10-07 – 2011-10-10 (×8): 5000 [IU] via SUBCUTANEOUS
  Filled 2011-10-07 (×14): qty 1

## 2011-10-07 MED ORDER — METRONIDAZOLE IN NACL 5-0.79 MG/ML-% IV SOLN
INTRAVENOUS | Status: AC
  Filled 2011-10-07: qty 100

## 2011-10-07 MED ORDER — OXYCODONE HCL 5 MG PO TABS
5.0000 mg | ORAL_TABLET | Freq: Every day | ORAL | Status: DC
  Administered 2011-10-07 – 2011-10-09 (×3): 5 mg via ORAL
  Filled 2011-10-07 (×3): qty 1

## 2011-10-07 MED ORDER — CINACALCET HCL 30 MG PO TABS
30.0000 mg | ORAL_TABLET | Freq: Every day | ORAL | Status: DC
  Administered 2011-10-08 – 2011-10-09 (×2): 30 mg via ORAL
  Filled 2011-10-07 (×5): qty 1

## 2011-10-07 MED ORDER — RALTEGRAVIR POTASSIUM 400 MG PO TABS
400.0000 mg | ORAL_TABLET | Freq: Two times a day (BID) | ORAL | Status: DC
  Administered 2011-10-07 – 2011-10-10 (×6): 400 mg via ORAL
  Filled 2011-10-07 (×10): qty 1

## 2011-10-07 MED ORDER — CIPROFLOXACIN IN D5W 400 MG/200ML IV SOLN
400.0000 mg | Freq: Once | INTRAVENOUS | Status: AC
  Administered 2011-10-07: 400 mg via INTRAVENOUS
  Filled 2011-10-07: qty 200

## 2011-10-07 MED ORDER — CIPROFLOXACIN IN D5W 400 MG/200ML IV SOLN
INTRAVENOUS | Status: AC
  Filled 2011-10-07: qty 200

## 2011-10-07 MED ORDER — ZIDOVUDINE 100 MG PO CAPS
100.0000 mg | ORAL_CAPSULE | Freq: Three times a day (TID) | ORAL | Status: DC
  Administered 2011-10-07 – 2011-10-10 (×8): 100 mg via ORAL
  Filled 2011-10-07 (×15): qty 1

## 2011-10-07 MED ORDER — ALBUTEROL SULFATE (5 MG/ML) 0.5% IN NEBU: 2.5000 mg | INHALATION_SOLUTION | Freq: Four times a day (QID) | RESPIRATORY_TRACT | Status: DC | PRN

## 2011-10-07 MED ORDER — POLYVINYL ALCOHOL 1.4 % OP SOLN
1.0000 [drp] | Freq: Two times a day (BID) | OPHTHALMIC | Status: DC
  Administered 2011-10-07 – 2011-10-10 (×6): 1 [drp] via OPHTHALMIC
  Filled 2011-10-07 (×3): qty 15

## 2011-10-07 MED ORDER — ONDANSETRON HCL 4 MG PO TABS
2.0000 mg | ORAL_TABLET | ORAL | Status: DC | PRN
  Filled 2011-10-07: qty 1

## 2011-10-07 MED ORDER — LAMIVUDINE 10 MG/ML PO SOLN
50.0000 mg | Freq: Every day | ORAL | Status: DC
  Filled 2011-10-07 (×6): qty 5

## 2011-10-07 MED ORDER — SODIUM CHLORIDE 0.9 % IV SOLN
INTRAVENOUS | Status: DC
  Administered 2011-10-07: 50 mL/h via INTRAVENOUS

## 2011-10-07 MED ORDER — LANTHANUM CARBONATE 500 MG PO CHEW
500.0000 mg | CHEWABLE_TABLET | ORAL | Status: DC
  Administered 2011-10-07 – 2011-10-09 (×3): 500 mg via ORAL
  Filled 2011-10-07 (×5): qty 1

## 2011-10-07 MED ORDER — EPOETIN ALFA 10000 UNIT/ML IJ SOLN
8000.0000 [IU] | INTRAMUSCULAR | Status: DC
  Filled 2011-10-07 (×2): qty 1

## 2011-10-07 MED ORDER — EPOETIN ALFA 10000 UNIT/ML IJ SOLN
8000.0000 [IU] | INTRAMUSCULAR | Status: DC
  Administered 2011-10-08 – 2011-10-10 (×2): 8000 [IU] via SUBCUTANEOUS
  Filled 2011-10-07 (×4): qty 1

## 2011-10-07 MED ORDER — LORAZEPAM 0.5 MG PO TABS
0.1250 mg | ORAL_TABLET | Freq: Four times a day (QID) | ORAL | Status: DC | PRN
  Filled 2011-10-07: qty 1

## 2011-10-07 MED ORDER — LANTHANUM CARBONATE 500 MG PO CHEW: 500.0000 mg | CHEWABLE_TABLET | Freq: Three times a day (TID) | ORAL | Status: DC

## 2011-10-07 MED ORDER — IOHEXOL 300 MG/ML  SOLN
100.0000 mL | Freq: Once | INTRAMUSCULAR | Status: AC | PRN
  Administered 2011-10-07: 100 mL via INTRAVENOUS

## 2011-10-07 MED ORDER — ONDANSETRON HCL 4 MG/5ML PO SOLN
ORAL | Status: AC
  Filled 2011-10-07: qty 1

## 2011-10-07 MED ORDER — IPRATROPIUM-ALBUTEROL 0.5-2.5 (3) MG/3ML IN SOLN: 3.0000 mL | Freq: Four times a day (QID) | RESPIRATORY_TRACT | Status: DC | PRN

## 2011-10-07 MED ORDER — LIDOCAINE 5 % EX PTCH
1.0000 | MEDICATED_PATCH | CUTANEOUS | Status: DC
  Administered 2011-10-08 – 2011-10-09 (×2): 1 via TRANSDERMAL
  Filled 2011-10-07 (×5): qty 1

## 2011-10-07 MED ORDER — RENA-VITE PO TABS
1.0000 | ORAL_TABLET | Freq: Every day | ORAL | Status: DC
  Administered 2011-10-08 – 2011-10-10 (×3): 1 via ORAL
  Filled 2011-10-07 (×7): qty 1

## 2011-10-07 MED ORDER — CIPROFLOXACIN IN D5W 400 MG/200ML IV SOLN
400.0000 mg | Freq: Two times a day (BID) | INTRAVENOUS | Status: DC
  Administered 2011-10-07 – 2011-10-08 (×2): 400 mg via INTRAVENOUS
  Filled 2011-10-07 (×3): qty 200

## 2011-10-07 MED ORDER — PANTOPRAZOLE SODIUM 40 MG PO TBEC
40.0000 mg | DELAYED_RELEASE_TABLET | Freq: Two times a day (BID) | ORAL | Status: DC
  Administered 2011-10-08 – 2011-10-10 (×5): 40 mg via ORAL
  Filled 2011-10-07 (×5): qty 1

## 2011-10-07 MED ORDER — MORPHINE SULFATE 2 MG/ML IJ SOLN
2.0000 mg | INTRAMUSCULAR | Status: AC | PRN
  Administered 2011-10-07 – 2011-10-08 (×2): 2 mg via INTRAVENOUS
  Filled 2011-10-07 (×2): qty 1

## 2011-10-07 MED ORDER — FENTANYL 25 MCG/HR TD PT72
25.0000 ug | MEDICATED_PATCH | TRANSDERMAL | Status: DC
  Administered 2011-10-09: 25 ug via TRANSDERMAL
  Filled 2011-10-07: qty 1

## 2011-10-07 MED ORDER — LANTHANUM CARBONATE 500 MG PO CHEW
1500.0000 mg | CHEWABLE_TABLET | Freq: Three times a day (TID) | ORAL | Status: DC
  Administered 2011-10-08 – 2011-10-10 (×3): 1500 mg via ORAL
  Filled 2011-10-07 (×14): qty 3

## 2011-10-07 MED ORDER — FAMOTIDINE IN NACL 20-0.9 MG/50ML-% IV SOLN
20.0000 mg | Freq: Once | INTRAVENOUS | Status: AC
  Administered 2011-10-07: 20 mg via INTRAVENOUS
  Filled 2011-10-07: qty 50

## 2011-10-07 MED ORDER — METRONIDAZOLE IN NACL 5-0.79 MG/ML-% IV SOLN
500.0000 mg | Freq: Once | INTRAVENOUS | Status: AC
  Administered 2011-10-07: 500 mg via INTRAVENOUS
  Filled 2011-10-07: qty 100

## 2011-10-07 MED ORDER — ONDANSETRON HCL 4 MG/2ML IJ SOLN
4.0000 mg | INTRAMUSCULAR | Status: DC | PRN
  Administered 2011-10-07: 4 mg via INTRAVENOUS

## 2011-10-07 MED ORDER — OXYCODONE HCL 5 MG PO TABS
5.0000 mg | ORAL_TABLET | Freq: Four times a day (QID) | ORAL | Status: DC | PRN
  Administered 2011-10-10: 5 mg via ORAL
  Filled 2011-10-07 (×2): qty 1

## 2011-10-07 MED ORDER — SILDENAFIL CITRATE 20 MG PO TABS
20.0000 mg | ORAL_TABLET | Freq: Three times a day (TID) | ORAL | Status: DC
  Administered 2011-10-07 – 2011-10-10 (×8): 20 mg via ORAL
  Filled 2011-10-07 (×15): qty 1

## 2011-10-07 MED ORDER — IPRATROPIUM BROMIDE 0.02 % IN SOLN
0.5000 mg | Freq: Four times a day (QID) | RESPIRATORY_TRACT | Status: DC | PRN
Start: 2011-10-07 — End: 2011-10-10

## 2011-10-07 NOTE — ED Notes (Signed)
Patient c/o pain in lower abdomen bilaterally.  Cramping in nature, began yesterday after dialysis.

## 2011-10-07 NOTE — ED Notes (Signed)
Report per ems from nursing home staff patient had a couple black tarry stools yesterday with last BM this am that "wasn't as bad".

## 2011-10-07 NOTE — H&P (Signed)
[facility] History and Physical  [name] [id number] DOB: December 28, 1950 DOA: 10/07/2011   PCP: Cyndee Brightly, MD   Chief Complaint: Diarrhea, abdominal pain and nausea.  HPI:  This 61 year old lady, who has a history of end-stage renal disease and HIV and who is on hemodialysis, presents with the above symptoms for the last one week. She was recently hospitalized in the third week of July with swelling of the lips which resolved with dialysis. She has been investigated emergency room today and has been found to have colitis on CT scan. She is now being referred for remission. She denies any rectal bleeding and fecal occult blood testing in the ER is negative. There is no hematemesis.  Review of Systems:  Negative apart from history of present illness.  Past Medical History  Diagnosis Date  . Pulmonary HTN   . Hypertension   . Depression   . Insomnia   . Chronic diarrhea   . HIV (human immunodeficiency virus infection)   . ESRD on hemodialysis    Past Surgical History  Procedure Date  . Dialysis shunts   . Esophagogastroduodenoscopy 12/15/2010    Procedure: ESOPHAGOGASTRODUODENOSCOPY (EGD);  Surgeon: Daneil Dolin, MD;  Location: [facility];  Service: Endoscopy;  Laterality: N/A;   Social History:  She lives in a skilled nursing facility. She does not smoke, does not drink alcohol now.   Allergies  Allergen Reactions  . Penicillins     REACTION: rash  . Latex Rash    Family History  Problem Relation Age of Onset  . Anesthesia problems Neg Hx   . Hypotension Neg Hx   . Malignant hyperthermia Neg Hx   . Pseudochol deficiency Neg Hx     Prior to Admission medications   Medication Sig Start Date End Date Taking? Authorizing Provider  b complex-vitamin c-folic acid (NEPHRO-VITE) 0.8 MG TABS Take 0.8 mg by mouth at bedtime.     Yes Historical Provider, MD  cinacalcet (SENSIPAR) 30 MG tablet Take 30 mg by mouth daily with supper.   Yes Historical  Provider, MD  fentaNYL (DURAGESIC - DOSED MCG/HR) 25 MCG/HR Place 1 patch onto the skin every other day. Rotate sites   Yes Historical Provider, MD  hydroxypropyl methylcellulose (ISOPTO TEARS) 2.5 % ophthalmic solution Place 1 drop into both eyes 2 (two) times daily.     Yes Historical Provider, MD  ipratropium-albuterol (DUONEB) 0.5-2.5 (3) MG/3ML SOLN Take 3 mLs by nebulization every 6 (six) hours as needed. For shortness of breath/ wheezing   Yes Historical Provider, MD  lamivudine (EPIVIR) 100 MG tablet Take 0.5 tablets (50 mg total) by mouth daily. Take one half tablet daily 06/11/10  Yes Campbell Riches, MD  lanthanum (FOSRENOL) 500 MG chewable tablet Chew 500-1,500 mg by mouth 4 (four) times daily -  with meals and at bedtime. 1500 mg with meals and 500 mg at 8pm   Yes Historical Provider, MD  lidocaine (LIDODERM) 5 % Place 1 patch onto the skin daily. Remove & Discard patch within 12 hours or as directed by MD    Yes Historical Provider, MD  lidocaine-prilocaine (EMLA) cream Apply 1 application topically as needed. Apply 1 hour prior to dialysis access site   Yes Historical Provider, MD  loperamide (IMODIUM) 2 MG capsule Take 2 mg by mouth 2 (two) times daily as needed. For diarrhea   Yes Historical Provider, MD  LORazepam (ATIVAN) 0.5 MG tablet Take 0.125 mg by mouth every 6 (six) hours as  needed. Take 1/4 of a tablet For increased anxiety   Yes Historical Provider, MD  Menthol, Topical Analgesic, (BIOFREEZE EX) Apply 1 application topically 4 (four) times daily as needed. For pain **do not apply near dialysis site**   Yes Historical Provider, MD  ondansetron (ZOFRAN) 4 MG tablet Take 2 mg by mouth every 4 (four) hours as needed. nausea   Yes Historical Provider, MD  oxyCODONE (OXY IR/ROXICODONE) 5 MG immediate release tablet Take 5 mg by mouth at bedtime.   Yes Historical Provider, MD  oxyCODONE (OXY IR/ROXICODONE) 5 MG immediate release tablet Take 5 mg by mouth every 6 (six) hours as  needed. For pain   Yes Historical Provider, MD  pantoprazole (PROTONIX) 40 MG tablet Take 1 tablet (40 mg total) by mouth 2 (two) times daily before a meal. 12/16/10 12/16/11 Yes Delfina Redwood, MD  raltegravir (ISENTRESS) 400 MG tablet Take 400 mg by mouth 2 (two) times daily.     Yes Historical Provider, MD  sildenafil (REVATIO) 20 MG tablet Take 20 mg by mouth 3 (three) times daily.     Yes Historical Provider, MD  vitamin C (ASCORBIC ACID) 500 MG tablet Take 500 mg by mouth 2 (two) times daily.   Yes Historical Provider, MD  Whey Protein Isolate POWD Take 1 Package by mouth 2 (two) times daily. Mix With vanilla pudding   Yes Historical Provider, MD  zidovudine (RETROVIR) 100 MG capsule Take 100 mg by mouth 3 (three) times daily.     Yes Historical Provider, MD  epoetin alfa (EPOGEN) 10000 UNIT/ML injection Inject 8,000 Units into the skin. To be given at dialysis    Historical Provider, MD   Physical Exam: Filed Vitals:   10/07/11 1127 10/07/11 1346 10/07/11 1518  BP: 174/76 161/78 148/96  Pulse: 74 78 78  Temp: 98.4 F (36.9 C)    TempSrc: Oral    Resp:  18 18  Height: 5\' 2"  (1.575 m)    Weight: 60.328 kg (133 lb)    SpO2: 97% 98% 100%     General:  She looks chronically sick but is not clinically septic.  Eyes: No jaundice, no pallor.  ENT: No abnormalities.  Neck: No neck lymphadenopathy.  Cardiovascular: Heart sounds are present and normal without murmurs.  Respiratory: Lung fields are clear.  Abdomen: Soft, tender in the left lower quadrant. Bowel sounds are very scanty. No hepatosplenomegaly. No masses.  Skin: No rash.  Musculoskeletal: No obvious joint deformities.  Psychiatric: Affect appropriate.  Neurologic: Alert and orientated without any focal neurological signs.  Labs on Admission:  Basic Metabolic Panel:  Lab [id number] 1130  NA 141  K 4.7  CL 104  CO2 24  GLUCOSE 67*  BUN 26*  CREATININE 9.62*  CALCIUM 9.1  MG --  PHOS --   Liver  Function Tests:  Lab 10/07/11 1130  AST 23  ALT 9  ALKPHOS 84  BILITOT 0.5  PROT 8.4*  ALBUMIN 3.3*    Lab 10/07/11 1130  LIPASE 38  AMYLASE --    CBC:  Lab 10/07/11 1130  WBC 4.2  NEUTROABS 3.2  HGB 10.5*  HCT 33.5*  MCV 106.7*  PLT 123*      BNP (last 3 results)  Basename 03/09/11 1503  PROBNP 3673.0*      Radiological Exams on Admission: Ct Abdomen Pelvis W Contrast  10/07/2011  *RADIOLOGY REPORT*  Clinical Data: Abdominal pain and diarrhea.  CT ABDOMEN AND PELVIS WITH CONTRAST  Technique:  Multidetector  CT imaging of the abdomen and pelvis was performed following the standard protocol during bolus administration of intravenous contrast.  Contrast:  100 ml Omnipaque-300  Comparison: 01/22/2009  Findings: Skin thickening and soft tissue edema is identified in the right breast.  The liver is unremarkable.  The spleen measures 12.7 cm and cranial caudal length, borderline enlarged.  Stomach, duodenum, pancreas, and gallbladder are unremarkable.  Common bile duct is 9 mm in diameter which is stable in the interval.  No evidence for adrenal mass.  Both kidneys are atrophic with innumerable bilateral tiny hypoattenuating lesions, likely reflecting cystic the disease of dialysis.  No abdominal aortic aneurysm.  There is no free fluid or lymphadenopathy in the abdomen.  Imaging through the pelvis shows no free intraperitoneal fluid. Uterus is surgically absent.  There is no adnexal mass.  The bladder is decompressed.  Circumferential ill-defined wall thickening is seen in the left colon, most prominently affecting the proximal sigmoid segment. There may be some mild wall thickening in the right colon/cecum as well.  The terminal ileum is unremarkable. The appendix is not visualized, but there is no edema or inflammation in the region of the cecum.  Diffuse sclerotic changes within the skeleton is compatible with a reported history of renal disease.  IMPRESSION: Circumferential wall  thickening in the left colon with potential involving the right colon as well.  Imaging features could be compatible with an infectious or inflammatory colitis.  Mild distention of the common bile duct is unchanged since the prior study.  Skin thickening and some apparent soft tissue edema in the right breast. This appearance is stable when comparing back to the prior study and also an exam from 12/12/2007.  Original Report Authenticated By: ERIC A. MANSELL, M.D.      Assessment/Plan   1. Colitis, presumed infectious in origin. Diverticulitis versus C. difficile colitis. We will need to get C. difficile toxin by PCR. Stool cultures. Start empirically on intravenous ciprofloxacin and Flagyl. Further adjustments to antibiotics can be made depending on tests of the C. difficile toxin PCR. 2. End-stage renal disease on hemodialysis. She is due to have dialysis tomorrow and we will ask nephrology to assist. 3. HIV disease, stable. 4. Hypertension, stable.  Code Status: Full code. Family Communication: Discuss plan with patient. Disposition Plan: Discharge back to the skilled nursing facility when medically stable.  Time spent: 30 minutes.  Doree Albee [facility] Pager [phone].  If 7PM-7AM, please contact night-coverage [email or web address] Password [facility] 10/07/2011, 5:27 PM

## 2011-10-07 NOTE — ED Provider Notes (Signed)
History     CSN: [id number]  Arrival date & time 10/07/11  1119   First MD Initiated Contact with Patient 10/07/11 1246      Chief Complaint  Patient presents with  . Abdominal Pain     HPI Pt was seen at 1305.  Per pt, c/o gradual onset and persistence of constant lower abd "pain" since yesterday. Describes the pain as "cramping."  Has been associated with several episodes of "dark stools" since yesterday.  Pt has hx of chronic diarrhea and +HIV.  Denies back pain, no fevers, no CP/SOB, no N/V, no bloody stools.     Past Medical History  Diagnosis Date  . Pulmonary HTN   . Hypertension   . Depression   . Insomnia   . Chronic diarrhea   . HIV (human immunodeficiency virus infection)   . ESRD on hemodialysis     Past Surgical History  Procedure Date  . Dialysis shunts   . Esophagogastroduodenoscopy 12/15/2010    Procedure: ESOPHAGOGASTRODUODENOSCOPY (EGD);  Surgeon: Daneil Dolin, MD;  Location: [facility];  Service: Endoscopy;  Laterality: N/A;    Family History  Problem Relation Age of Onset  . Anesthesia problems Neg Hx   . Hypotension Neg Hx   . Malignant hyperthermia Neg Hx   . Pseudochol deficiency Neg Hx     History  Substance Use Topics  . Smoking status: Never Smoker   . Smokeless tobacco: Never Used  . Alcohol Use: No    OB History    Grav Para Term Preterm Abortions TAB SAB Ect Mult Living   1 1 1       1       Review of Systems ROS: Statement: All systems negative except as marked or noted in the HPI; Constitutional: Negative for fever and chills. ; ; Eyes: Negative for eye pain, redness and discharge. ; ; ENMT: Negative for ear pain, hoarseness, nasal congestion, sinus pressure and sore throat. ; ; Cardiovascular: Negative for chest pain, palpitations, diaphoresis, dyspnea and peripheral edema. ; ; Respiratory: Negative for cough, wheezing and stridor. ; ; Gastrointestinal: +lower abd pain, diarrhea. Negative for nausea, vomiting, blood in  stool, hematemesis, jaundice and rectal bleeding. . ; ; Genitourinary: Negative for dysuria, flank pain and hematuria. ; ; Musculoskeletal: Negative for back pain and neck pain. Negative for swelling and trauma.; ; Skin: Negative for pruritus, rash, abrasions, blisters, bruising and skin lesion.; ; Neuro: Negative for headache, lightheadedness and neck stiffness. Negative for weakness, altered level of consciousness , altered mental status, extremity weakness, paresthesias, involuntary movement, seizure and syncope.     Allergies  Penicillins and Latex  Home Medications   Current Outpatient Rx  Name Route Sig Dispense Refill  . NEPHRO-VITE 0.8 MG PO TABS Oral Take 0.8 mg by mouth at bedtime.      Marland Kitchen CINACALCET HCL 30 MG PO TABS Oral Take 30 mg by mouth daily with supper.    . FENTANYL 25 MCG/HR TD PT72 Transdermal Place 1 patch onto the skin every other day. Rotate sites    . HYPROMELLOSE 2.5 % OP SOLN Both Eyes Place 1 drop into both eyes 2 (two) times daily.      . IPRATROPIUM-ALBUTEROL 0.5-2.5 (3) MG/3ML IN SOLN Nebulization Take 3 mLs by nebulization every 6 (six) hours as needed. For shortness of breath/ wheezing    . LAMIVUDINE 100 MG PO TABS Oral Take 0.5 tablets (50 mg total) by mouth daily. Take one half tablet daily 150  tablet 90  . LANTHANUM CARBONATE 500 MG PO CHEW Oral Chew 500-1,500 mg by mouth 4 (four) times daily -  with meals and at bedtime. 1500 mg with meals and 500 mg at 8pm    . LIDOCAINE 5 % EX PTCH Transdermal Place 1 patch onto the skin daily. Remove & Discard patch within 12 hours or as directed by MD     . LIDOCAINE-PRILOCAINE 2.5-2.5 % EX CREA Topical Apply 1 application topically as needed. Apply 1 hour prior to dialysis access site    . LOPERAMIDE HCL 2 MG PO CAPS Oral Take 2 mg by mouth 2 (two) times daily as needed. For diarrhea    . LORAZEPAM 0.5 MG PO TABS Oral Take 0.125 mg by mouth every 6 (six) hours as needed. Take 1/4 of a tablet For increased anxiety    .  BIOFREEZE EX Apply externally Apply 1 application topically 4 (four) times daily as needed. For pain **do not apply near dialysis site**    . ONDANSETRON HCL 4 MG PO TABS Oral Take 2 mg by mouth every 4 (four) hours as needed. nausea    . OXYCODONE HCL 5 MG PO TABS Oral Take 5 mg by mouth at bedtime.    . OXYCODONE HCL 5 MG PO TABS Oral Take 5 mg by mouth every 6 (six) hours as needed. For pain    . PANTOPRAZOLE SODIUM 40 MG PO TBEC Oral Take 1 tablet (40 mg total) by mouth 2 (two) times daily before a meal.    . RALTEGRAVIR POTASSIUM 400 MG PO TABS Oral Take 400 mg by mouth 2 (two) times daily.      Marland Kitchen SILDENAFIL CITRATE 20 MG PO TABS Oral Take 20 mg by mouth 3 (three) times daily.      Marland Kitchen VITAMIN C 500 MG PO TABS Oral Take 500 mg by mouth 2 (two) times daily.    . WHEY PROTEIN ISOLATE POWD Oral Take 1 Package by mouth 2 (two) times daily. Mix With vanilla pudding    . ZIDOVUDINE 100 MG PO CAPS Oral Take 100 mg by mouth 3 (three) times daily.      . EPOETIN ALFA 03474 UNIT/ML IJ SOLN Subcutaneous Inject 8,000 Units into the skin. To be given at dialysis      BP 161/78  Pulse 78  Temp 98.4 F (36.9 C) (Oral)  Resp 18  Ht 5\' 2"  (1.575 m)  Wt 133 lb (60.328 kg)  BMI 24.33 kg/m2  SpO2 98%  Physical Exam 1310: Physical examination:  Nursing notes reviewed; Vital signs and O2 SAT reviewed;  Constitutional: Well developed, Well nourished, Well hydrated, In no acute distress; Head:  Normocephalic, atraumatic; Eyes: EOMI, PERRL, No scleral icterus; ENMT: Mouth and pharynx normal, Mucous membranes moist; Neck: Supple, Full range of motion, No lymphadenopathy; Cardiovascular: Regular rate and rhythm, No gallop; Respiratory: Breath sounds clear & equal bilaterally, No wheezes.  Speaking full sentences with ease, Normal respiratory effort/excursion; Chest: Nontender, Movement normal; Abdomen: Soft, +LLQ tender to palp.  No rebound or guarding. Nondistended, Normal bowel sounds, Rectal exam performed  w/permission of pt and ED RN chaparone present.  Anal tone normal.  Non-tender, soft brown stool in rectal vault, heme neg.  No fissures, no external hemorrhoids, no palp masses.;; Genitourinary: No CVA tenderness; Extremities: Pulses normal, No tenderness, No edema, No calf edema or asymmetry.; Neuro: AA&Ox3, Major CN grossly intact.  Speech clear. No gross focal motor or sensory deficits in extremities.; Skin: Color normal, Warm,  Dry.   ED Course  Procedures   MDM  MDM Reviewed: nursing note, vitals and previous chart Reviewed previous: labs Interpretation: labs and CT scan     Results for orders placed during the hospital encounter of 10/07/11  CBC WITH DIFFERENTIAL      Component Value Range   WBC 4.2  4.0 - 10.5 K/uL   RBC 3.14 (*) 3.87 - 5.11 MIL/uL   Hemoglobin 10.5 (*) 12.0 - 15.0 g/dL   HCT 33.5 (*) 36.0 - 46.0 %   MCV 106.7 (*) 78.0 - 100.0 fL   MCH 33.4  26.0 - 34.0 pg   MCHC 31.3  30.0 - 36.0 g/dL   RDW 16.9 (*) 11.5 - 15.5 %   Platelets 123 (*) 150 - 400 K/uL   Neutrophils Relative 77  43 - 77 %   Lymphocytes Relative 16  12 - 46 %   Monocytes Relative 6  3 - 12 %   Eosinophils Relative 1  0 - 5 %   Basophils Relative 0  0 - 1 %   Neutro Abs 3.2  1.7 - 7.7 K/uL   Lymphs Abs 0.7  0.7 - 4.0 K/uL   Monocytes Absolute 0.3  0.1 - 1.0 K/uL   Eosinophils Absolute 0.0  0.0 - 0.7 K/uL   Basophils Absolute 0.0  0.0 - 0.1 K/uL   RBC Morphology GIANT PLATELETS SEEN     WBC Morphology ATYPICAL LYMPHOCYTES     Smear Review PLATELET COUNT CONFIRMED BY SMEAR    COMPREHENSIVE METABOLIC PANEL      Component Value Range   Sodium 141  135 - 145 mEq/L   Potassium 4.7  3.5 - 5.1 mEq/L   Chloride 104  96 - 112 mEq/L   CO2 24  19 - 32 mEq/L   Glucose, Bld 67 (*) 70 - 99 mg/dL   BUN 26 (*) 6 - 23 mg/dL   Creatinine, Ser 9.62 (*) 0.50 - 1.10 mg/dL   Calcium 9.1  8.4 - 10.5 mg/dL   Total Protein 8.4 (*) 6.0 - 8.3 g/dL   Albumin 3.3 (*) 3.5 - 5.2 g/dL   AST 23  0 - 37 U/L   ALT 9   0 - 35 U/L   Alkaline Phosphatase 84  39 - 117 U/L   Total Bilirubin 0.5  0.3 - 1.2 mg/dL   GFR calc non Af Amer 4 (*) >90 mL/min   GFR calc Af Amer 5 (*) >90 mL/min  LIPASE, BLOOD      Component Value Range   Lipase 38  11 - 59 U/L  OCCULT BLOOD, POC DEVICE      Component Value Range   Fecal Occult Bld NEGATIVE      Ct Abdomen Pelvis W Contrast 10/07/2011  *RADIOLOGY REPORT*  Clinical Data: Abdominal pain and diarrhea.  CT ABDOMEN AND PELVIS WITH CONTRAST  Technique:  Multidetector CT imaging of the abdomen and pelvis was performed following the standard protocol during bolus administration of intravenous contrast.  Contrast:  100 ml Omnipaque-300  Comparison: 01/22/2009  Findings: Skin thickening and soft tissue edema is identified in the right breast.  The liver is unremarkable.  The spleen measures 12.7 cm and cranial caudal length, borderline enlarged.  Stomach, duodenum, pancreas, and gallbladder are unremarkable.  Common bile duct is 9 mm in diameter which is stable in the interval.  No evidence for adrenal mass.  Both kidneys are atrophic with innumerable bilateral tiny hypoattenuating lesions, likely reflecting cystic  the disease of dialysis.  No abdominal aortic aneurysm.  There is no free fluid or lymphadenopathy in the abdomen.  Imaging through the pelvis shows no free intraperitoneal fluid. Uterus is surgically absent.  There is no adnexal mass.  The bladder is decompressed.  Circumferential ill-defined wall thickening is seen in the left colon, most prominently affecting the proximal sigmoid segment. There may be some mild wall thickening in the right colon/cecum as well.  The terminal ileum is unremarkable. The appendix is not visualized, but there is no edema or inflammation in the region of the cecum.  Diffuse sclerotic changes within the skeleton is compatible with a reported history of renal disease.  IMPRESSION: Circumferential wall thickening in the left colon with potential  involving the right colon as well.  Imaging features could be compatible with an infectious or inflammatory colitis.  Mild distention of the common bile duct is unchanged since the prior study.  Skin thickening and some apparent soft tissue edema in the right breast. This appearance is stable when comparing back to the prior study and also an exam from 12/12/2007.  Original Report Authenticated By: ERIC A. MANSELL, M.D.     5:12 PM:  Will start abx for colitis.  Stool cx and Cdiff ordered.  Has not stooled while in the ED.  Last note from ID Dr. Algis Downs ofc on 05/21/11:  HIV RNA ND, CD4 262.  Dx testing d/w pt.  Questions answered.  Verb understanding, agreeable to admit.  T/C to [facility] Dr. Anastasio Champion, case discussed, including:  HPI, pertinent PM/SHx, VS/PE, dx testing, ED course and treatment:  Agreeable to admit.      Alfonzo Feller, DO 10/09/11 2038

## 2011-10-07 NOTE — ED Notes (Signed)
Report called to Bethena Roys, RN on unit 300.

## 2011-10-08 ENCOUNTER — Inpatient Hospital Stay (HOSPITAL_COMMUNITY): Payer: Medicare Other

## 2011-10-08 DIAGNOSIS — D649 Anemia, unspecified: Secondary | ICD-10-CM

## 2011-10-08 DIAGNOSIS — K5289 Other specified noninfective gastroenteritis and colitis: Secondary | ICD-10-CM

## 2011-10-08 LAB — COMPREHENSIVE METABOLIC PANEL
ALT: 7 U/L (ref 0–35)
AST: 13 U/L (ref 0–37)
Albumin: 2.7 g/dL — ABNORMAL LOW (ref 3.5–5.2)
Alkaline Phosphatase: 73 U/L (ref 39–117)
BUN: 31 mg/dL — ABNORMAL HIGH (ref 6–23)
CO2: 22 mEq/L (ref 19–32)
Calcium: 7.8 mg/dL — ABNORMAL LOW (ref 8.4–10.5)
Chloride: 101 mEq/L (ref 96–112)
Creatinine, Ser: 11.25 mg/dL — ABNORMAL HIGH (ref 0.50–1.10)
GFR calc Af Amer: 4 mL/min — ABNORMAL LOW (ref >90)
GFR calc non Af Amer: 3 mL/min — ABNORMAL LOW (ref >90)
Glucose, Bld: 63 mg/dL — ABNORMAL LOW (ref 70–99)
Potassium: 4.6 mEq/L (ref 3.5–5.1)
Sodium: 137 mEq/L (ref 135–145)
Total Bilirubin: 0.4 mg/dL (ref 0.3–1.2)
Total Protein: 7.5 g/dL (ref 6.0–8.3)

## 2011-10-08 LAB — CBC
Hemoglobin: 9.3 g/dL — ABNORMAL LOW (ref 12.0–15.0)
MCH: 33 pg (ref 26.0–34.0)
MCHC: 31 g/dL (ref 30.0–36.0)

## 2011-10-08 MED ORDER — HEPARIN SODIUM (PORCINE) 1000 UNIT/ML DIALYSIS
20.0000 [IU]/kg | INTRAMUSCULAR | Status: DC | PRN
  Administered 2011-10-08: 1200 [IU] via INTRAVENOUS_CENTRAL
  Filled 2011-10-08: qty 2

## 2011-10-08 MED ORDER — CIPROFLOXACIN IN D5W 400 MG/200ML IV SOLN
400.0000 mg | INTRAVENOUS | Status: DC
Start: 2011-10-09 — End: 2011-10-09
  Filled 2011-10-08: qty 200

## 2011-10-08 MED ORDER — HEPARIN SODIUM (PORCINE) 1000 UNIT/ML DIALYSIS
300.0000 [IU] | INTRAMUSCULAR | Status: DC | PRN
  Administered 2011-10-08: 300 [IU] via INTRAVENOUS_CENTRAL
  Filled 2011-10-08: qty 1

## 2011-10-08 MED ORDER — SODIUM CHLORIDE 0.9 % IJ SOLN
INTRAMUSCULAR | Status: AC
  Filled 2011-10-08: qty 3

## 2011-10-08 NOTE — Progress Notes (Signed)
Subjective: This lady is feeling somewhat better, she has not actually had any diarrhea since admission. There's been no fever. She was seen by nephrology, Dr Hinda Lenis and he has arranged dialysis for today.           Physical Exam: Blood pressure 165/74, pulse 79, temperature 98.6 F (37 C), temperature source Oral, resp. rate 20, height 5\' 2"  (1.575 m), weight 60.328 kg (133 lb), SpO2 95.00%. She does look systemically well. She is not toxic or septic. Her abdomen is still tender in the left upper and lower quadrants. Lungs are clear.   Investigations:  No results found for this or any previous visit (from the past 240 hour(s)).   Basic Metabolic Panel:  Basename 10/08/11 0440 10/07/11 1130  NA 137 141  K 4.6 4.7  CL 101 104  CO2 22 24  GLUCOSE 63* 67*  BUN 31* 26*  CREATININE 11.25* 9.62*  CALCIUM 7.8* 9.1  MG -- --  PHOS -- --   Liver Function Tests:  [facility] 10/08/11 0440 10/07/11 1130  AST 13 23  ALT 7 9  ALKPHOS 73 84  BILITOT 0.4 0.5  PROT 7.5 8.4*  ALBUMIN 2.7* 3.3*     CBC:  Basename 10/08/11 0440 10/07/11 1130  WBC 3.3* 4.2  NEUTROABS -- 3.2  HGB 9.3* 10.5*  HCT 30.0* 33.5*  MCV 106.4* 106.7*  PLT 86* 123*    Ct Abdomen Pelvis W Contrast  10/07/2011  *RADIOLOGY REPORT*  Clinical Data: Abdominal pain and diarrhea.  CT ABDOMEN AND PELVIS WITH CONTRAST  Technique:  Multidetector CT imaging of the abdomen and pelvis was performed following the standard protocol during bolus administration of intravenous contrast.  Contrast:  100 ml Omnipaque-300  Comparison: 01/22/2009  Findings: Skin thickening and soft tissue edema is identified in the right breast.  The liver is unremarkable.  The spleen measures 12.7 cm and cranial caudal length, borderline enlarged.  Stomach, duodenum, pancreas, and gallbladder are unremarkable.  Common bile duct is 9 mm in diameter which is stable in the interval.  No evidence for adrenal mass.  Both kidneys are  atrophic with innumerable bilateral tiny hypoattenuating lesions, likely reflecting cystic the disease of dialysis.  No abdominal aortic aneurysm.  There is no free fluid or lymphadenopathy in the abdomen.  Imaging through the pelvis shows no free intraperitoneal fluid. Uterus is surgically absent.  There is no adnexal mass.  The bladder is decompressed.  Circumferential ill-defined wall thickening is seen in the left colon, most prominently affecting the proximal sigmoid segment. There may be some mild wall thickening in the right colon/cecum as well.  The terminal ileum is unremarkable. The appendix is not visualized, but there is no edema or inflammation in the region of the cecum.  Diffuse sclerotic changes within the skeleton is compatible with a reported history of renal disease.  IMPRESSION: Circumferential wall thickening in the left colon with potential involving the right colon as well.  Imaging features could be compatible with an infectious or inflammatory colitis.  Mild distention of the common bile duct is unchanged since the prior study.  Skin thickening and some apparent soft tissue edema in the right breast. This appearance is stable when comparing back to the prior study and also an exam from 12/12/2007.  Original Report Authenticated By: ERIC A. MANSELL, M.D.      Medications: I have reviewed the patient's current medications.  Impression: 1. Colitis, presumed infectious. Await C. difficile toxin by PCR when  stool is available. 2. HIV disease, stable. 3. End stage renal disease on hemodialysis. 4. Hypertension. 5. Anemia of chronic kidney failure.     Plan: 1. Continue with current therapy. 2. Dialysis today. 3. Await stool for C. difficile toxin.     LOS: 1 day   Doree Albee Pager [phone]  10/08/2011, 9:08 AM

## 2011-10-08 NOTE — Clinical Social Work Psychosocial (Signed)
Clinical Social Work Department BRIEF PSYCHOSOCIAL ASSESSMENT 10/08/2011  Patient:  [name], [name]     Account Number:  [id number]     Admit date:  10/07/2011  Clinical Social Worker:  Wyatt Haste  Date/Time:  10/08/2011 09:30 AM  Referred by:  RN  Date Referred:  10/08/2011 Referred for  SNF Placement   Other Referral:   Interview type:  Patient Other interview type:    PSYCHOSOCIAL DATA Living Status:  FACILITY Admitted from facility:  [facility] Level of care:  [place] Primary support name:  [name] Primary support relationship to patient:  FRIEND Degree of support available:   adequate    CURRENT CONCERNS Current Concerns  Post-Acute Placement   Other Concerns:    SOCIAL WORK ASSESSMENT / PLAN CSW met with pt at bedside. Pt oriented but very sleepy. Pt is well known to CSW due to multiple admissions in past. Pt has been resident at [facility] for over 3 years. She has limited outside support. Pt is on dialysis and is scheduled for treatment today. Per [name] at facility, okay for pt to return at d/c.   Assessment/plan status:  Psychosocial Support/Ongoing Assessment of Needs Other assessment/ plan:   Information/referral to community resources:   [employer]    PATIENT'S/FAMILY'S RESPONSE TO PLAN OF CARE: Pt reports positive feelings regarding return to [facility] when medically stable. Pt considers [facility] her home. CSW will continue to follow.        [name], [name]

## 2011-10-08 NOTE — Progress Notes (Signed)
UR chart review completed.  

## 2011-10-08 NOTE — Consult Note (Signed)
Reason for Consult: End-stage renal disease Referring Physician: Dr.Gosrani  [name] is an 61 y.o. female.  HPI: She is a patient who has a history of her hypertension, HIV disease and end-stage renal disease presently came with complaints of some nausea and diarrhea of a couple of weeks duration. According to patient she is his diarrhea on and off since she was discharged from the hospital. Patient denies any fever chills or sweating. Presently she said he still she has some diarrhea we'll one time. She described as loose with some streak of blood. She denies any difficulty breathing.  Past Medical History  Diagnosis Date  . Pulmonary HTN   . Hypertension   . Depression   . Insomnia   . Chronic diarrhea   . HIV (human immunodeficiency virus infection)   . ESRD on hemodialysis     Past Surgical History  Procedure Date  . Dialysis shunts   . Esophagogastroduodenoscopy 12/15/2010    Procedure: ESOPHAGOGASTRODUODENOSCOPY (EGD);  Surgeon: Daneil Dolin, MD;  Location: [facility];  Service: Endoscopy;  Laterality: N/A;    Family History  Problem Relation Age of Onset  . Anesthesia problems Neg Hx   . Hypotension Neg Hx   . Malignant hyperthermia Neg Hx   . Pseudochol deficiency Neg Hx     Social History:  reports that she has never smoked. She has never used smokeless tobacco. She reports that she does not drink alcohol or use illicit drugs.  Allergies:  Allergies  Allergen Reactions  . Penicillins     REACTION: rash  . Latex Rash    Medications: I have reviewed the patient's current medications.  Results for orders placed during the hospital encounter of 10/07/11 (from the past 48 hour(s))  CBC WITH DIFFERENTIAL     Status: Abnormal   Collection Time   10/07/11 11:30 AM      Component Value Range Comment   WBC 4.2  4.0 - 10.5 K/uL    RBC 3.14 (*) 3.87 - 5.11 MIL/uL    Hemoglobin 10.5 (*) 12.0 - 15.0 g/dL    HCT 33.5 (*) 36.0 - 46.0 %    MCV 106.7 (*) 78.0 -  100.0 fL    MCH 33.4  26.0 - 34.0 pg    MCHC 31.3  30.0 - 36.0 g/dL    RDW 16.9 (*) 11.5 - 15.5 %    Platelets 123 (*) 150 - 400 K/uL    Neutrophils Relative 77  43 - 77 %    Lymphocytes Relative 16  12 - 46 %    Monocytes Relative 6  3 - 12 %    Eosinophils Relative 1  0 - 5 %    Basophils Relative 0  0 - 1 %    Neutro Abs 3.2  1.7 - 7.7 K/uL    Lymphs Abs 0.7  0.7 - 4.0 K/uL    Monocytes Absolute 0.3  0.1 - 1.0 K/uL    Eosinophils Absolute 0.0  0.0 - 0.7 K/uL    Basophils Absolute 0.0  0.0 - 0.1 K/uL    RBC Morphology GIANT PLATELETS SEEN      WBC Morphology ATYPICAL LYMPHOCYTES      Smear Review PLATELET COUNT CONFIRMED BY SMEAR   LARGE PLATELETS PRESENT  COMPREHENSIVE METABOLIC PANEL     Status: Abnormal   Collection Time   10/07/11 11:30 AM      Component Value Range Comment   Sodium 141  135 - 145 mEq/L  Potassium 4.7  3.5 - 5.1 mEq/L    Chloride 104  96 - 112 mEq/L    CO2 24  19 - 32 mEq/L    Glucose, Bld 67 (*) 70 - 99 mg/dL    BUN 26 (*) 6 - 23 mg/dL    Creatinine, Ser 9.62 (*) 0.50 - 1.10 mg/dL    Calcium 9.1  8.4 - 10.5 mg/dL    Total Protein 8.4 (*) 6.0 - 8.3 g/dL    Albumin 3.3 (*) 3.5 - 5.2 g/dL    AST 23  0 - 37 U/L    ALT 9  0 - 35 U/L    Alkaline Phosphatase 84  39 - 117 U/L    Total Bilirubin 0.5  0.3 - 1.2 mg/dL    GFR calc non Af Amer 4 (*) >90 mL/min    GFR calc Af Amer 5 (*) >90 mL/min   LIPASE, BLOOD     Status: Normal   Collection Time   10/07/11 11:30 AM      Component Value Range Comment   Lipase 38  11 - 59 U/L   OCCULT BLOOD, POC DEVICE     Status: Normal   Collection Time   10/07/11  1:17 PM      Component Value Range Comment   Fecal Occult Bld NEGATIVE     COMPREHENSIVE METABOLIC PANEL     Status: Abnormal   Collection Time   10/08/11  4:40 AM      Component Value Range Comment   Sodium 137  135 - 145 mEq/L    Potassium 4.6  3.5 - 5.1 mEq/L    Chloride 101  96 - 112 mEq/L    CO2 22  19 - 32 mEq/L    Glucose, Bld 63 (*) 70 - 99 mg/dL     BUN 31 (*) 6 - 23 mg/dL    Creatinine, Ser 11.25 (*) 0.50 - 1.10 mg/dL    Calcium 7.8 (*) 8.4 - 10.5 mg/dL    Total Protein 7.5  6.0 - 8.3 g/dL    Albumin 2.7 (*) 3.5 - 5.2 g/dL    AST 13  0 - 37 U/L    ALT 7  0 - 35 U/L    Alkaline Phosphatase 73  39 - 117 U/L    Total Bilirubin 0.4  0.3 - 1.2 mg/dL    GFR calc non Af Amer 3 (*) >90 mL/min    GFR calc Af Amer 4 (*) >90 mL/min   CBC     Status: Abnormal   Collection Time   10/08/11  4:40 AM      Component Value Range Comment   WBC 3.3 (*) 4.0 - 10.5 K/uL    RBC 2.82 (*) 3.87 - 5.11 MIL/uL    Hemoglobin 9.3 (*) 12.0 - 15.0 g/dL    HCT 30.0 (*) 36.0 - 46.0 %    MCV 106.4 (*) 78.0 - 100.0 fL    MCH 33.0  26.0 - 34.0 pg    MCHC 31.0  30.0 - 36.0 g/dL    RDW 16.3 (*) 11.5 - 15.5 %    Platelets 86 (*) 150 - 400 K/uL DELTA CHECK NOTED    Ct Abdomen Pelvis W Contrast  10/07/2011  *RADIOLOGY REPORT*  Clinical Data: Abdominal pain and diarrhea.  CT ABDOMEN AND PELVIS WITH CONTRAST  Technique:  Multidetector CT imaging of the abdomen and pelvis was performed following the standard protocol during bolus administration of intravenous contrast.  Contrast:  100 ml Omnipaque-300  Comparison: 01/22/2009  Findings: Skin thickening and soft tissue edema is identified in the right breast.  The liver is unremarkable.  The spleen measures 12.7 cm and cranial caudal length, borderline enlarged.  Stomach, duodenum, pancreas, and gallbladder are unremarkable.  Common bile duct is 9 mm in diameter which is stable in the interval.  No evidence for adrenal mass.  Both kidneys are atrophic with innumerable bilateral tiny hypoattenuating lesions, likely reflecting cystic the disease of dialysis.  No abdominal aortic aneurysm.  There is no free fluid or lymphadenopathy in the abdomen.  Imaging through the pelvis shows no free intraperitoneal fluid. Uterus is surgically absent.  There is no adnexal mass.  The bladder is decompressed.  Circumferential ill-defined wall  thickening is seen in the left colon, most prominently affecting the proximal sigmoid segment. There may be some mild wall thickening in the right colon/cecum as well.  The terminal ileum is unremarkable. The appendix is not visualized, but there is no edema or inflammation in the region of the cecum.  Diffuse sclerotic changes within the skeleton is compatible with a reported history of renal disease.  IMPRESSION: Circumferential wall thickening in the left colon with potential involving the right colon as well.  Imaging features could be compatible with an infectious or inflammatory colitis.  Mild distention of the common bile duct is unchanged since the prior study.  Skin thickening and some apparent soft tissue edema in the right breast. This appearance is stable when comparing back to the prior study and also an exam from 12/12/2007.  Original Report Authenticated By: ERIC A. MANSELL, M.D.    Review of Systems  Constitutional: Positive for chills.  Respiratory: Negative for sputum production, shortness of breath and wheezing.   Gastrointestinal: Positive for nausea, abdominal pain, diarrhea and blood in stool.  Neurological: Positive for weakness.   Blood pressure 165/74, pulse 79, temperature 98.6 F (37 C), temperature source Oral, resp. rate 20, height 5\' 2"  (1.575 m), weight 60.328 kg (133 lb), SpO2 95.00%. Physical Exam  Eyes: No scleral icterus.  Neck: JVD present.  Cardiovascular: Normal rate, regular rhythm and normal heart sounds.   Respiratory: Effort normal. No respiratory distress. She has wheezes. She has no rales.  GI: She exhibits distension. There is no tenderness. There is no rebound.  Musculoskeletal: She exhibits no tenderness.    Assessment/Plan: Problem #1 end-stage renal disease she status post hemodialysis on Friday patient is due for dialysis today. Her pending creatinine is was in acceptable range normal potassium and that his permission have any uremic sinus  symptoms. Problem #2 diarrhea is etiology not clear presently she seems to be feeling better. Problem #3 history of hypertension her blood pressure seems to be reasonably controlled Problem #4 history of HIV disease Problem #5 history of anemia secondary to end-stage renal disease patient is on Epogen. Problem #6 history of lymphoproliferative disease. Problem #7 metabolic bone disease. Calcium is normal phosphorus level is not available. Patient is on a binder as outpatient. Plan: We'll make arrangements for patient to get dialysis today We'll check her basic metabolic panel, phosphorus and CBC in the morning. We'll continue his other treatment as before.  Mohammedali Bedoy S 10/08/2011, 7:08 AM

## 2011-10-09 LAB — CBC
MCH: 33 pg (ref 26.0–34.0)
MCV: 106.6 fL — ABNORMAL HIGH (ref 78.0–100.0)
Platelets: 91 10*3/uL — ABNORMAL LOW (ref 150–400)
RDW: 16 % — ABNORMAL HIGH (ref 11.5–15.5)
WBC: 2.8 10*3/uL — ABNORMAL LOW (ref 4.0–10.5)

## 2011-10-09 LAB — BASIC METABOLIC PANEL
Calcium: 8.6 mg/dL (ref 8.4–10.5)
Creatinine, Ser: 6.98 mg/dL — ABNORMAL HIGH (ref 0.50–1.10)
GFR calc Af Amer: 7 mL/min — ABNORMAL LOW (ref >90)
GFR calc non Af Amer: 6 mL/min — ABNORMAL LOW (ref >90)

## 2011-10-09 LAB — PHOSPHORUS: Phosphorus: 5.7 mg/dL — ABNORMAL HIGH (ref 2.3–4.6)

## 2011-10-09 MED ORDER — SODIUM CHLORIDE 0.9 % IJ SOLN
INTRAMUSCULAR | Status: AC
  Administered 2011-10-09: 10 mL
  Filled 2011-10-09: qty 6

## 2011-10-09 MED ORDER — HEPARIN SODIUM (PORCINE) 1000 UNIT/ML DIALYSIS
300.0000 [IU] | INTRAMUSCULAR | Status: DC | PRN
  Filled 2011-10-09: qty 1

## 2011-10-09 MED ORDER — VANCOMYCIN 50 MG/ML ORAL SOLUTION
250.0000 mg | Freq: Four times a day (QID) | ORAL | Status: DC
  Administered 2011-10-10: 250 mg via ORAL
  Filled 2011-10-09 (×13): qty 5

## 2011-10-09 MED ORDER — HEPARIN SODIUM (PORCINE) 1000 UNIT/ML DIALYSIS
20.0000 [IU]/kg | INTRAMUSCULAR | Status: DC | PRN
  Administered 2011-10-10: 1200 [IU] via INTRAVENOUS_CENTRAL
  Filled 2011-10-09: qty 2

## 2011-10-09 MED ORDER — METRONIDAZOLE 500 MG PO TABS
500.0000 mg | ORAL_TABLET | Freq: Three times a day (TID) | ORAL | Status: DC
  Administered 2011-10-09: 500 mg via ORAL
  Filled 2011-10-09: qty 1

## 2011-10-09 MED ORDER — DIPHENHYDRAMINE HCL 25 MG PO CAPS
25.0000 mg | ORAL_CAPSULE | ORAL | Status: DC | PRN
  Administered 2011-10-09: 25 mg via ORAL
  Filled 2011-10-09: qty 1

## 2011-10-09 NOTE — Progress Notes (Signed)
Subjective: Interval History: has no complaint of difficulty in breathing. She has some nausea but no vomiting. Diarrhea is also improving. She has some abdominal pain but is not as bad as before. Her main complaint seems to be feeling tired..  Objective: Vital signs in last 24 hours: Temp:  [98 F (36.7 C)-98.8 F (37.1 C)] 98.3 F (36.8 C) (08/09 0507) Pulse Rate:  [60-83] 77  (08/09 0507) Resp:  [18-20] 20  (08/09 0507) BP: (131-159)/(64-87) 147/87 mmHg (08/09 0507) SpO2:  [95 %-98 %] 96 % (08/09 0507) Weight:  [59.3 kg (130 lb 11.7 oz)-62.4 kg (137 lb 9.1 oz)] 59.3 kg (130 lb 11.7 oz) (08/08 1815) Weight change: 2.072 kg (4 lb 9.1 oz)  Intake/Output from previous day: 08/08 0701 - 08/09 0700 In: 782.5 [P.O.:120; I.V.:462.5; IV Piggyback:200] Out: 2700  Intake/Output this shift:    General appearance: alert, cooperative and no distress Resp: clear to auscultation bilaterally Cardio: regular rate and rhythm, S1, S2 normal, no murmur, click, rub or gallop GI: soft, non-tender; bowel sounds normal; no masses,  no organomegaly Extremities: extremities normal, atraumatic, no cyanosis or edema  Lab Results:  Basename 10/09/11 0448 10/08/11 0440  WBC 2.8* 3.3*  HGB 10.0* 9.3*  HCT 32.3* 30.0*  PLT 91* 86*   BMET:  Basename 10/09/11 0448 10/08/11 0440  NA 137 137  K 4.1 4.6  CL 98 101  CO2 26 22  GLUCOSE 64* 63*  BUN 13 31*  CREATININE 6.98* 11.25*  CALCIUM 8.6 7.8*   No results found for this basename: PTH:2 in the last 72 hours Iron Studies: No results found for this basename: IRON,TIBC,TRANSFERRIN,FERRITIN in the last 72 hours  Studies/Results: Ct Abdomen Pelvis W Contrast  10/07/2011  *RADIOLOGY REPORT*  Clinical Data: Abdominal pain and diarrhea.  CT ABDOMEN AND PELVIS WITH CONTRAST  Technique:  Multidetector CT imaging of the abdomen and pelvis was performed following the standard protocol during bolus administration of intravenous contrast.  Contrast:  100 ml  Omnipaque-300  Comparison: 01/22/2009  Findings: Skin thickening and soft tissue edema is identified in the right breast.  The liver is unremarkable.  The spleen measures 12.7 cm and cranial caudal length, borderline enlarged.  Stomach, duodenum, pancreas, and gallbladder are unremarkable.  Common bile duct is 9 mm in diameter which is stable in the interval.  No evidence for adrenal mass.  Both kidneys are atrophic with innumerable bilateral tiny hypoattenuating lesions, likely reflecting cystic the disease of dialysis.  No abdominal aortic aneurysm.  There is no free fluid or lymphadenopathy in the abdomen.  Imaging through the pelvis shows no free intraperitoneal fluid. Uterus is surgically absent.  There is no adnexal mass.  The bladder is decompressed.  Circumferential ill-defined wall thickening is seen in the left colon, most prominently affecting the proximal sigmoid segment. There may be some mild wall thickening in the right colon/cecum as well.  The terminal ileum is unremarkable. The appendix is not visualized, but there is no edema or inflammation in the region of the cecum.  Diffuse sclerotic changes within the skeleton is compatible with a reported history of renal disease.  IMPRESSION: Circumferential wall thickening in the left colon with potential involving the right colon as well.  Imaging features could be compatible with an infectious or inflammatory colitis.  Mild distention of the common bile duct is unchanged since the prior study.  Skin thickening and some apparent soft tissue edema in the right breast. This appearance is stable when comparing back to  the prior study and also an exam from 12/12/2007.  Original Report Authenticated By: ERIC A. MANSELL, M.D.    I have reviewed the patient's current medications.  Assessment/Plan: Problem #1 end-stage renal disease she status post hemodialysis yesterday her BUN and creatinine was in acceptable range normal potassium. Problem #2 C.  difficile colitis she is on antibiotics. Patient is a febrile seems to be improving. Problem #3 history of HIV disease Problem #4 hypertension her blood pressure seems to be controlled very well. Problem #5 lymphoproliferative disease Problem #6 metabolic bone disease: Phosphorus 5.7 slightly high normal calcium. Intact PTH is not available. Problem #7 history of anemia secondary to end-stage renal disease her hemoglobin and hematocrit is stable. Plan: We'll make arrangements for patient to get dialysis tomorrow. We'll DC her IV fluid continue his other medications as before. We'll check her basic metabolic panel in the morning.    LOS: 2 days   Liliann File S 10/09/2011,7:04 AM

## 2011-10-09 NOTE — Progress Notes (Signed)
Patient still refusing to take her PO vancomycin.  MD was made aware by day nurse. Will continue to monitor.

## 2011-10-09 NOTE — Progress Notes (Signed)
Notified Dr Ezzie Dural per text that the patient had been refusing her Vancomycin and her liquid HIV med.

## 2011-10-09 NOTE — Clinical Social Work Note (Signed)
Call from [facility] Admissions requesting patient update.  Provided update as requested.    Justice Britain Clinical Social Worker [phone])

## 2011-10-09 NOTE — Progress Notes (Signed)
Subjective: This lady is feeling nauseous today. She was switched to oral metronidazole after her C. difficile toxin was positive. I think she is probably not tolerating oral metronidazole. Otherwise there are no major issues, her abdominal pain is present but bearable.           Physical Exam: Blood pressure 147/87, pulse 77, temperature 98.3 F (36.8 C), temperature source Oral, resp. rate 20, height 5\' 2"  (1.575 m), weight 59.3 kg (130 lb 11.7 oz), SpO2 96.00%. She does look systemically well. She is not toxic or septic. Her abdomen is still tender in the left upper and lower quadrants. Lungs are clear.   Investigations:  Recent Results (from the past 240 hour(s))  CLOSTRIDIUM DIFFICILE BY PCR     Status: Abnormal   Collection Time   10/08/11  8:32 PM      Component Value Range Status Comment   C difficile by pcr POSITIVE (*) NEGATIVE Final      Basic Metabolic Panel:  Basename 10/09/11 0448 10/08/11 0440  NA 137 137  K 4.1 4.6  CL 98 101  CO2 26 22  GLUCOSE 64* 63*  BUN 13 31*  CREATININE 6.98* 11.25*  CALCIUM 8.6 7.8*  MG -- --  PHOS 5.7* --   Liver Function Tests:  Basename 10/08/11 0440 10/07/11 1130  AST 13 23  ALT 7 9  ALKPHOS 73 84  BILITOT 0.4 0.5  PROT 7.5 8.4*  ALBUMIN 2.7* 3.3*     CBC:  Basename 10/09/11 0448 10/08/11 0440 10/07/11 1130  WBC 2.8* 3.3* --  NEUTROABS -- -- 3.2  HGB 10.0* 9.3* --  HCT 32.3* 30.0* --  MCV 106.6* 106.4* --  PLT 91* 86* --    Ct Abdomen Pelvis W Contrast  10/07/2011  *RADIOLOGY REPORT*  Clinical Data: Abdominal pain and diarrhea.  CT ABDOMEN AND PELVIS WITH CONTRAST  Technique:  Multidetector CT imaging of the abdomen and pelvis was performed following the standard protocol during bolus administration of intravenous contrast.  Contrast:  100 ml Omnipaque-300  Comparison: 01/22/2009  Findings: Skin thickening and soft tissue edema is identified in the right breast.  The liver is unremarkable.  The spleen  measures 12.7 cm and cranial caudal length, borderline enlarged.  Stomach, duodenum, pancreas, and gallbladder are unremarkable.  Common bile duct is 9 mm in diameter which is stable in the interval.  No evidence for adrenal mass.  Both kidneys are atrophic with innumerable bilateral tiny hypoattenuating lesions, likely reflecting cystic the disease of dialysis.  No abdominal aortic aneurysm.  There is no free fluid or lymphadenopathy in the abdomen.  Imaging through the pelvis shows no free intraperitoneal fluid. Uterus is surgically absent.  There is no adnexal mass.  The bladder is decompressed.  Circumferential ill-defined wall thickening is seen in the left colon, most prominently affecting the proximal sigmoid segment. There may be some mild wall thickening in the right colon/cecum as well.  The terminal ileum is unremarkable. The appendix is not visualized, but there is no edema or inflammation in the region of the cecum.  Diffuse sclerotic changes within the skeleton is compatible with a reported history of renal disease.  IMPRESSION: Circumferential wall thickening in the left colon with potential involving the right colon as well.  Imaging features could be compatible with an infectious or inflammatory colitis.  Mild distention of the common bile duct is unchanged since the prior study.  Skin thickening and some apparent soft tissue edema in  the right breast. This appearance is stable when comparing back to the prior study and also an exam from 12/12/2007.  Original Report Authenticated By: ERIC A. MANSELL, M.D.      Medications: I have reviewed the patient's current medications.  Impression: 1. C. difficile colitis.. 2. HIV disease, stable. 3. End stage renal disease on hemodialysis. 4. Hypertension. 5. Anemia of chronic kidney failure.     Plan: 1. Discontinue metronidazole. Start oral vancomycin. 2. Dialysis tomorrow. 3. If stable, she can be discharged back to the skilled nursing  facility tomorrow.     LOS: 2 days   Doree Albee Pager [phone]  10/09/2011, 9:53 AM

## 2011-10-10 ENCOUNTER — Encounter (HOSPITAL_COMMUNITY): Payer: Self-pay | Admitting: Internal Medicine

## 2011-10-10 ENCOUNTER — Inpatient Hospital Stay (HOSPITAL_COMMUNITY): Payer: Medicare Other

## 2011-10-10 DIAGNOSIS — R197 Diarrhea, unspecified: Secondary | ICD-10-CM

## 2011-10-10 DIAGNOSIS — A0472 Enterocolitis due to Clostridium difficile, not specified as recurrent: Secondary | ICD-10-CM

## 2011-10-10 DIAGNOSIS — Z21 Asymptomatic human immunodeficiency virus [HIV] infection status: Secondary | ICD-10-CM

## 2011-10-10 HISTORY — DX: Enterocolitis due to Clostridium difficile, not specified as recurrent: A04.72

## 2011-10-10 LAB — BASIC METABOLIC PANEL
BUN: 23 mg/dL (ref 6–23)
Calcium: 7.9 mg/dL — ABNORMAL LOW (ref 8.4–10.5)
GFR calc non Af Amer: 3 mL/min — ABNORMAL LOW (ref >90)
Glucose, Bld: 74 mg/dL (ref 70–99)

## 2011-10-10 LAB — CBC
HCT: 31.7 % — ABNORMAL LOW (ref 36.0–46.0)
Hemoglobin: 9.8 g/dL — ABNORMAL LOW (ref 12.0–15.0)
MCH: 32.6 pg (ref 26.0–34.0)
MCHC: 30.9 g/dL (ref 30.0–36.0)

## 2011-10-10 MED ORDER — VANCOMYCIN 50 MG/ML ORAL SOLUTION: 250.0000 mg | Freq: Four times a day (QID) | ORAL | Status: DC

## 2011-10-10 NOTE — Progress Notes (Signed)
Outpatient plan of care on chart/reviewed. Pt. Education provided. Cannulated RFA AVF [id number]" without difficulty.  Pt. States she has not had a bowel movement today, and is feeling much better,

## 2011-10-10 NOTE — Discharge Summary (Signed)
Physician Discharge Summary  [name] [id number] DOB: 1951-01-20 DOA: 10/07/2011  PCP: Cyndee Brightly, MD  Admit date: 10/07/2011 Discharge date: 10/10/2011  Recommendations for Outpatient Follow-up:  1. Patient will be discharged back to her skilled nursing facility. She will follow up with their physician at the facility. Anti-diarrheals should be avoided.  Discharge Diagnoses:  Principal Problem:  *C. difficile colitis Active Problems:  HUMAN IMMUNODEFICIENCY VIRUS [HIV]  ANEMIA, NORMOCYTIC, CHRONIC  HYPERTENSION  End stage renal disease   Discharge Condition: Improved  Diet recommendation: Low salt, low fiber  Filed Weights   10/08/11 1815 10/10/11 0804 10/10/11 1105  Weight: 59.3 kg (130 lb 11.7 oz) 59.3 kg (130 lb 11.7 oz) 58.4 kg (128 lb 12 oz)    History of present illness:  This 61 year old lady, who has a history of end-stage renal disease and HIV and who is on hemodialysis, presents with the above symptoms for the last one week. She was recently hospitalized in the third week of July with swelling of the lips which resolved with dialysis. She has been investigated emergency room today and has been found to have colitis on CT scan. She is now being referred for remission. She denies any rectal bleeding and fecal occult blood testing in the ER is negative. There is no hematemesis.   Hospital Course:  Patient was admitted with colitis. She was started empirically on ciprofloxacin and Flagyl. Stool for Clostridium difficile PCR returned positive. She was continued on oral Flagyl, but complained of persistent nausea. She was having difficulty tolerating Flagyl and it was therefore switched to oral vancomycin. She will need to complete a total 14 days of therapy. She was continued on the remainder of her medications. She was continued on dialysis per schedule and she was followed by Dr. Lowanda Foster  Procedures:  None  Consultations:  Nephrology, Dr.  Lowanda Foster  Discharge Exam: Filed Vitals:   10/10/11 1105  BP: 194/88  Pulse: 67  Temp: 98.6 F (37 C)  Resp: 18   Filed Vitals:   10/10/11 0945 10/10/11 1015 10/10/11 1045 10/10/11 1105  BP: 172/83 174/91 171/80 194/88  Pulse: 66 69 66 67  Temp:    98.6 F (37 C)  TempSrc:    Oral  Resp: 18 18 18 18   Height:      Weight:    58.4 kg (128 lb 12 oz)  SpO2:    95%    General: No acute distress Cardiovascular: S1, S2, regular rate and rhythm Respiratory: Clear to auscultation bilaterally Abdomen: Soft, nontender, nondistended, bowel sounds active  Discharge Instructions  Discharge Orders    Future Orders Please Complete By Expires   Diet - low sodium heart healthy      Increase activity slowly        Medication List  As of 10/10/2011 11:44 AM   STOP taking these medications         loperamide 2 MG capsule         TAKE these medications         b complex-vitamin c-folic acid 0.8 MG Tabs   Take 0.8 mg by mouth at bedtime.      BIOFREEZE EX   Apply 1 application topically 4 (four) times daily as needed. For pain  **do not apply near dialysis site**      cinacalcet 30 MG tablet   Commonly known as: SENSIPAR   Take 30 mg by mouth daily with supper.      EPOGEN 10000 UNIT/ML injection  Generic drug: epoetin alfa   Inject 8,000 Units into the skin. To be given at dialysis      fentaNYL 25 MCG/HR   Commonly known as: [place] - dosed mcg/hr   Place 1 patch onto the skin every other day. Rotate sites      hydroxypropyl methylcellulose 2.5 % ophthalmic solution   Commonly known as: ISOPTO TEARS   Place 1 drop into both eyes 2 (two) times daily.      ipratropium-albuterol 0.5-2.5 (3) MG/3ML Soln   Commonly known as: DUONEB   Take 3 mLs by nebulization every 6 (six) hours as needed. For shortness of breath/ wheezing      lamivudine 100 MG tablet   Commonly known as: EPIVIR   Take 0.5 tablets (50 mg total) by mouth daily. Take one half tablet daily       lanthanum 500 MG chewable tablet   Commonly known as: FOSRENOL   Chew 500-1,500 mg by mouth 4 (four) times daily -  with meals and at bedtime. 1500 mg with meals and 500 mg at 8pm      lidocaine 5 %   Commonly known as: LIDODERM   Place 1 patch onto the skin daily. Remove & Discard patch within 12 hours or as directed by MD      lidocaine-prilocaine cream   Commonly known as: EMLA   Apply 1 application topically as needed. Apply 1 hour prior to dialysis access site      LORazepam 0.5 MG tablet   Commonly known as: ATIVAN   Take 0.125 mg by mouth every 6 (six) hours as needed. Take 1/4 of a tablet For increased anxiety      ondansetron 4 MG tablet   Commonly known as: ZOFRAN   Take 2 mg by mouth every 4 (four) hours as needed. nausea      oxyCODONE 5 MG immediate release tablet   Commonly known as: Oxy IR/ROXICODONE   Take 5 mg by mouth at bedtime.      oxyCODONE 5 MG immediate release tablet   Commonly known as: Oxy IR/ROXICODONE   Take 5 mg by mouth every 6 (six) hours as needed. For pain      pantoprazole 40 MG tablet   Commonly known as: PROTONIX   Take 1 tablet (40 mg total) by mouth 2 (two) times daily before a meal.      raltegravir 400 MG tablet   Commonly known as: ISENTRESS   Take 400 mg by mouth 2 (two) times daily.      sildenafil 20 MG tablet   Commonly known as: REVATIO   Take 20 mg by mouth 3 (three) times daily.      vancomycin 50 mg/mL oral solution   Commonly known as: VANCOCIN   Take 5 mLs (250 mg total) by mouth every 6 (six) hours. Until 8/23      vitamin C 500 MG tablet   Commonly known as: ASCORBIC ACID   Take 500 mg by mouth 2 (two) times daily.      Whey Protein Isolate Powd   Take 1 Package by mouth 2 (two) times daily. Mix With vanilla pudding      zidovudine 100 MG capsule   Commonly known as: RETROVIR   Take 100 mg by mouth 3 (three) times daily.           Follow-up Information    Follow up with Cyndee Brightly, MD. Schedule  an appointment as soon as possible for a  visit in 1 week.   Contact information:   [facility] [phone]           The results of significant diagnostics from this hospitalization (including imaging, microbiology, ancillary and laboratory) are listed below for reference.    Significant Diagnostic Studies: Dg Neck Soft Tissue  09/21/2011  *RADIOLOGY REPORT*  Clinical Data: And wheezing  NECK SOFT TISSUES - 1+ VIEW  Comparison: none  Findings: There is prevertebral soft tissue swelling anterior to the C2 through C6 vertebral bodies measuring up to 2 cm.  No retropharyngeal gas is present.  The glottis is normal.  No acute bony abnormality.  IMPRESSION: Prevertebral soft tissue swelling from C2 through C6.  The findings discussed Dr. Monia Pouch on 09/20/2011 at 2200 hours  Original Report Authenticated By: Suzy Bouchard, M.D.   Dg Chest 2 View  09/21/2011  *RADIOLOGY REPORT*  Clinical Data: Angioedema  CHEST - 2 VIEW  Comparison: None available  Findings: Enlarged heart silhouette.  There is perihilar fullness. No comparison available. There is nodularity in left or right lung which may represent granulomas disease.  Lymphadenectomy clips in the right axilla.  IMPRESSION:  1.  Perihilar fullness on the right and left.  No comparison. 2.  Nodularity may represent granulomatous disease. 3.  If no chest x-ray or CT comparison, recommend non emergent CT thorax.  Findings discussed  with Dr. Monia Pouch on 09/20/2011 at 22:10 hours  Original Report Authenticated By: Suzy Bouchard, M.D.   Ct Abdomen Pelvis W Contrast  10/07/2011  *RADIOLOGY REPORT*  Clinical Data: Abdominal pain and diarrhea.  CT ABDOMEN AND PELVIS WITH CONTRAST  Technique:  Multidetector CT imaging of the abdomen and pelvis was performed following the standard protocol during bolus administration of intravenous contrast.  Contrast:  100 ml Omnipaque-300  Comparison: 01/22/2009  Findings: Skin  thickening and soft tissue edema is identified in the right breast.  The liver is unremarkable.  The spleen measures 12.7 cm and cranial caudal length, borderline enlarged.  Stomach, duodenum, pancreas, and gallbladder are unremarkable.  Common bile duct is 9 mm in diameter which is stable in the interval.  No evidence for adrenal mass.  Both kidneys are atrophic with innumerable bilateral tiny hypoattenuating lesions, likely reflecting cystic the disease of dialysis.  No abdominal aortic aneurysm.  There is no free fluid or lymphadenopathy in the abdomen.  Imaging through the pelvis shows no free intraperitoneal fluid. Uterus is surgically absent.  There is no adnexal mass.  The bladder is decompressed.  Circumferential ill-defined wall thickening is seen in the left colon, most prominently affecting the proximal sigmoid segment. There may be some mild wall thickening in the right colon/cecum as well.  The terminal ileum is unremarkable. The appendix is not visualized, but there is no edema or inflammation in the region of the cecum.  Diffuse sclerotic changes within the skeleton is compatible with a reported history of renal disease.  IMPRESSION: Circumferential wall thickening in the left colon with potential involving the right colon as well.  Imaging features could be compatible with an infectious or inflammatory colitis.  Mild distention of the common bile duct is unchanged since the prior study.  Skin thickening and some apparent soft tissue edema in the right breast. This appearance is stable when comparing back to the prior study and also an exam from 12/12/2007.  Original Report Authenticated By: ERIC A. MANSELL, M.D.    Microbiology: Recent Results (from the past 240 hour(s))  CLOSTRIDIUM DIFFICILE BY PCR     Status: Abnormal   Collection Time   10/08/11  8:32 PM      Component Value Range Status Comment   C difficile by pcr POSITIVE (*) NEGATIVE Final      Labs: Basic Metabolic Panel:  Lab  [id number] 0609 10/09/11 0448 10/08/11 0440 10/07/11 1130  NA 139 137 137 141  K 4.2 4.1 4.6 4.7  CL 99 98 101 104  CO2 23 26 22 24   GLUCOSE 74 64* 63* 67*  BUN 23 13 31* 26*  CREATININE 10.70* 6.98* 11.25* 9.62*  CALCIUM 7.9* 8.6 7.8* 9.1  MG -- -- -- --  PHOS -- 5.7* -- --   Liver Function Tests:  Lab 10/08/11 0440 10/07/11 1130  AST 13 23  ALT 7 9  ALKPHOS 73 84  BILITOT 0.4 0.5  PROT 7.5 8.4*  ALBUMIN 2.7* 3.3*    Lab 10/07/11 1130  LIPASE 38  AMYLASE --   No results found for this basename: AMMONIA:5 in the last 168 hours CBC:  Lab 10/10/11 0609 10/09/11 0448 10/08/11 0440 10/07/11 1130  WBC 2.3* 2.8* 3.3* 4.2  NEUTROABS -- -- -- 3.2  HGB 9.8* 10.0* 9.3* 10.5*  HCT 31.7* 32.3* 30.0* 33.5*  MCV 105.3* 106.6* 106.4* 106.7*  PLT 70* 91* 86* 123*   Cardiac Enzymes: No results found for this basename: CKTOTAL:5,CKMB:5,CKMBINDEX:5,TROPONINI:5 in the last 168 hours BNP: BNP (last 3 results)  Basename 03/09/11 1503  PROBNP 3673.0*   CBG: No results found for this basename: GLUCAP:5 in the last 168 hours  Time coordinating discharge: Greater than 30 minutes  Signed:  Mussa Groesbeck  [facility] 10/10/2011, 11:44 AM

## 2011-10-10 NOTE — Progress Notes (Signed)
Patient is set to discharge back to [facility] today. [facility] called for 2:30p pick-up. D/C Packet left @ nursing station.   Winfred Leeds, LCSW Clinical Social Worker

## 2011-10-10 NOTE — Progress Notes (Signed)
Pt. Received back from dialysis, pt. Crying , requesting pain medication.

## 2011-10-10 NOTE — Progress Notes (Signed)
Pt c/o "stinging" in her access (arterial end). Needle was adjusted, flipped, re-taped. No signs/symptoms of infiltration. Pt. Stated she wanted to sign off because, "I just can't take this anymore." Once pt. Was rinsed back, she no longer complained of "stinging" in her access. States, "It feels better now."

## 2011-10-10 NOTE — Progress Notes (Signed)
Subjective: Interval History: has no complaint of . Seems to be some weakness. She denies any difficulty increasing she'll have any orthopnea or paroxysmal nocturnal dyspnea. Her diarrhea also has improved..  Objective: Vital signs in last 24 hours: Temp:  [98.5 F (36.9 C)-98.7 F (37.1 C)] 98.7 F (37.1 C) (08/10 0447) Pulse Rate:  [73-78] 78  (08/10 0447) Resp:  [20] 20  (08/10 0447) BP: (145-156)/(81-86) 145/86 mmHg (08/10 0447) SpO2:  [94 %-96 %] 94 % (08/10 0447) Weight change:   Intake/Output from previous day: 08/09 0701 - 08/10 0700 In: 460 [P.O.:460] Out: -  Intake/Output this shift:    General appearance: alert, cooperative and no distress Resp: clear to auscultation bilaterally Cardio: regular rate and rhythm, S1, S2 normal, no murmur, click, rub or gallop GI: soft, non-tender; bowel sounds normal; no masses,  no organomegaly Extremities: extremities normal, atraumatic, no cyanosis or edema  Lab Results:  [facility] 10/10/11 0609 10/09/11 0448  WBC 2.3* 2.8*  HGB 9.8* 10.0*  HCT 31.7* 32.3*  PLT 70* 91*   BMET:  Basename 10/10/11 0609 10/09/11 0448  NA 139 137  K 4.2 4.1  CL 99 98  CO2 23 26  GLUCOSE 74 64*  BUN 23 13  CREATININE 10.70* 6.98*  CALCIUM 7.9* 8.6   No results found for this basename: PTH:2 in the last 72 hours Iron Studies: No results found for this basename: IRON,TIBC,TRANSFERRIN,FERRITIN in the last 72 hours  Studies/Results: No results found.  I have reviewed the patient's current medications.  Assessment/Plan: Problem #1 end-stage disease she status post hemodialysis on Thursday. Her BUN and 3 creatinine is 10.70. Her potassium is 4.2. Problem #2 anemia her hemoglobin is 9.8 hematocrit 31.7 stable Problem #3 history of C. difficile colitis patient seems to be improving. She doesn't have any fever chills or sweating. Problem #4 history of hypertension blood pressure seems to be controlled very well Problem #5 history of HIV  disease Problem #6 history of CHF no sign of fluid overload. Problem #7 history of hyperphosphatemia phosphorus 5.7 with normal calcium. Problem #8 history of malnutrition. Plan: We'll do dialysis today and we'll try to get about 2 and a decrease in her blood pressure tolerates. We'll continue with other medications as before.    LOS: 3 days   Mehul Rudin S 10/10/2011,8:38 AM

## 2011-10-10 NOTE — Progress Notes (Signed)
Report given to Kellie Simmering LPN at [facility], pt. Transported back to [facility] via EMS.

## 2011-10-10 NOTE — Progress Notes (Signed)
Pt. Taken to dialysis via bed per Hemodialysis RN.

## 2011-10-13 LAB — STOOL CULTURE

## 2011-10-16 DIAGNOSIS — Z79899 Other long term (current) drug therapy: Secondary | ICD-10-CM | POA: Diagnosis not present

## 2011-10-16 DIAGNOSIS — D649 Anemia, unspecified: Secondary | ICD-10-CM | POA: Diagnosis not present

## 2011-10-17 DIAGNOSIS — Z79899 Other long term (current) drug therapy: Secondary | ICD-10-CM | POA: Diagnosis not present

## 2011-10-19 DIAGNOSIS — I871 Compression of vein: Secondary | ICD-10-CM | POA: Diagnosis not present

## 2011-10-19 DIAGNOSIS — T82898A Other specified complication of vascular prosthetic devices, implants and grafts, initial encounter: Secondary | ICD-10-CM | POA: Diagnosis not present

## 2011-10-19 DIAGNOSIS — I771 Stricture of artery: Secondary | ICD-10-CM | POA: Diagnosis not present

## 2011-10-19 DIAGNOSIS — N186 End stage renal disease: Secondary | ICD-10-CM | POA: Diagnosis not present

## 2011-10-31 DIAGNOSIS — N186 End stage renal disease: Secondary | ICD-10-CM | POA: Diagnosis not present

## 2011-11-05 DIAGNOSIS — N039 Chronic nephritic syndrome with unspecified morphologic changes: Secondary | ICD-10-CM | POA: Diagnosis not present

## 2011-11-05 DIAGNOSIS — D631 Anemia in chronic kidney disease: Secondary | ICD-10-CM | POA: Diagnosis not present

## 2011-11-05 DIAGNOSIS — N186 End stage renal disease: Secondary | ICD-10-CM | POA: Diagnosis not present

## 2011-11-05 DIAGNOSIS — D509 Iron deficiency anemia, unspecified: Secondary | ICD-10-CM | POA: Diagnosis not present

## 2011-11-05 DIAGNOSIS — N2581 Secondary hyperparathyroidism of renal origin: Secondary | ICD-10-CM | POA: Diagnosis not present

## 2011-11-05 DIAGNOSIS — D472 Monoclonal gammopathy: Secondary | ICD-10-CM | POA: Diagnosis not present

## 2011-11-10 DIAGNOSIS — D631 Anemia in chronic kidney disease: Secondary | ICD-10-CM | POA: Diagnosis not present

## 2011-11-10 DIAGNOSIS — N2581 Secondary hyperparathyroidism of renal origin: Secondary | ICD-10-CM | POA: Diagnosis not present

## 2011-11-10 DIAGNOSIS — D509 Iron deficiency anemia, unspecified: Secondary | ICD-10-CM | POA: Diagnosis not present

## 2011-11-10 DIAGNOSIS — N186 End stage renal disease: Secondary | ICD-10-CM | POA: Diagnosis not present

## 2011-11-10 DIAGNOSIS — D472 Monoclonal gammopathy: Secondary | ICD-10-CM | POA: Diagnosis not present

## 2011-11-11 ENCOUNTER — Ambulatory Visit: Payer: Medicare Other | Admitting: Gastroenterology

## 2011-11-11 DIAGNOSIS — G894 Chronic pain syndrome: Secondary | ICD-10-CM | POA: Diagnosis not present

## 2011-11-14 DIAGNOSIS — N186 End stage renal disease: Secondary | ICD-10-CM | POA: Diagnosis not present

## 2011-11-14 DIAGNOSIS — D509 Iron deficiency anemia, unspecified: Secondary | ICD-10-CM | POA: Diagnosis not present

## 2011-11-14 DIAGNOSIS — D472 Monoclonal gammopathy: Secondary | ICD-10-CM | POA: Diagnosis not present

## 2011-11-14 DIAGNOSIS — N2581 Secondary hyperparathyroidism of renal origin: Secondary | ICD-10-CM | POA: Diagnosis not present

## 2011-11-14 DIAGNOSIS — N039 Chronic nephritic syndrome with unspecified morphologic changes: Secondary | ICD-10-CM | POA: Diagnosis not present

## 2011-11-19 DIAGNOSIS — D472 Monoclonal gammopathy: Secondary | ICD-10-CM | POA: Diagnosis not present

## 2011-11-19 DIAGNOSIS — D509 Iron deficiency anemia, unspecified: Secondary | ICD-10-CM | POA: Diagnosis not present

## 2011-11-19 DIAGNOSIS — D631 Anemia in chronic kidney disease: Secondary | ICD-10-CM | POA: Diagnosis not present

## 2011-11-19 DIAGNOSIS — N186 End stage renal disease: Secondary | ICD-10-CM | POA: Diagnosis not present

## 2011-11-19 DIAGNOSIS — N2581 Secondary hyperparathyroidism of renal origin: Secondary | ICD-10-CM | POA: Diagnosis not present

## 2011-11-21 DIAGNOSIS — N186 End stage renal disease: Secondary | ICD-10-CM | POA: Diagnosis not present

## 2011-11-21 DIAGNOSIS — D472 Monoclonal gammopathy: Secondary | ICD-10-CM | POA: Diagnosis not present

## 2011-11-21 DIAGNOSIS — N2581 Secondary hyperparathyroidism of renal origin: Secondary | ICD-10-CM | POA: Diagnosis not present

## 2011-11-21 DIAGNOSIS — D509 Iron deficiency anemia, unspecified: Secondary | ICD-10-CM | POA: Diagnosis not present

## 2011-11-21 DIAGNOSIS — D631 Anemia in chronic kidney disease: Secondary | ICD-10-CM | POA: Diagnosis not present

## 2011-11-24 DIAGNOSIS — N2581 Secondary hyperparathyroidism of renal origin: Secondary | ICD-10-CM | POA: Diagnosis not present

## 2011-11-24 DIAGNOSIS — D631 Anemia in chronic kidney disease: Secondary | ICD-10-CM | POA: Diagnosis not present

## 2011-11-24 DIAGNOSIS — D472 Monoclonal gammopathy: Secondary | ICD-10-CM | POA: Diagnosis not present

## 2011-11-24 DIAGNOSIS — N186 End stage renal disease: Secondary | ICD-10-CM | POA: Diagnosis not present

## 2011-11-24 DIAGNOSIS — D509 Iron deficiency anemia, unspecified: Secondary | ICD-10-CM | POA: Diagnosis not present

## 2011-11-25 DIAGNOSIS — J301 Allergic rhinitis due to pollen: Secondary | ICD-10-CM | POA: Diagnosis not present

## 2011-11-26 DIAGNOSIS — D631 Anemia in chronic kidney disease: Secondary | ICD-10-CM | POA: Diagnosis not present

## 2011-11-26 DIAGNOSIS — N186 End stage renal disease: Secondary | ICD-10-CM | POA: Diagnosis not present

## 2011-11-26 DIAGNOSIS — N2581 Secondary hyperparathyroidism of renal origin: Secondary | ICD-10-CM | POA: Diagnosis not present

## 2011-11-26 DIAGNOSIS — D472 Monoclonal gammopathy: Secondary | ICD-10-CM | POA: Diagnosis not present

## 2011-11-26 DIAGNOSIS — D649 Anemia, unspecified: Secondary | ICD-10-CM | POA: Diagnosis not present

## 2011-11-26 DIAGNOSIS — D509 Iron deficiency anemia, unspecified: Secondary | ICD-10-CM | POA: Diagnosis not present

## 2011-11-30 DIAGNOSIS — N186 End stage renal disease: Secondary | ICD-10-CM | POA: Diagnosis not present

## 2011-12-01 DIAGNOSIS — D472 Monoclonal gammopathy: Secondary | ICD-10-CM | POA: Diagnosis not present

## 2011-12-01 DIAGNOSIS — Z992 Dependence on renal dialysis: Secondary | ICD-10-CM | POA: Diagnosis not present

## 2011-12-01 DIAGNOSIS — D509 Iron deficiency anemia, unspecified: Secondary | ICD-10-CM | POA: Diagnosis not present

## 2011-12-01 DIAGNOSIS — Z23 Encounter for immunization: Secondary | ICD-10-CM | POA: Diagnosis not present

## 2011-12-01 DIAGNOSIS — N186 End stage renal disease: Secondary | ICD-10-CM | POA: Diagnosis not present

## 2011-12-01 DIAGNOSIS — D631 Anemia in chronic kidney disease: Secondary | ICD-10-CM | POA: Diagnosis not present

## 2011-12-01 DIAGNOSIS — N2581 Secondary hyperparathyroidism of renal origin: Secondary | ICD-10-CM | POA: Diagnosis not present

## 2011-12-01 DIAGNOSIS — N039 Chronic nephritic syndrome with unspecified morphologic changes: Secondary | ICD-10-CM | POA: Diagnosis not present

## 2011-12-03 DIAGNOSIS — D631 Anemia in chronic kidney disease: Secondary | ICD-10-CM | POA: Diagnosis not present

## 2011-12-03 DIAGNOSIS — D472 Monoclonal gammopathy: Secondary | ICD-10-CM | POA: Diagnosis not present

## 2011-12-03 DIAGNOSIS — N2581 Secondary hyperparathyroidism of renal origin: Secondary | ICD-10-CM | POA: Diagnosis not present

## 2011-12-03 DIAGNOSIS — N186 End stage renal disease: Secondary | ICD-10-CM | POA: Diagnosis not present

## 2011-12-03 DIAGNOSIS — Z23 Encounter for immunization: Secondary | ICD-10-CM | POA: Diagnosis not present

## 2011-12-03 DIAGNOSIS — D509 Iron deficiency anemia, unspecified: Secondary | ICD-10-CM | POA: Diagnosis not present

## 2011-12-05 DIAGNOSIS — D631 Anemia in chronic kidney disease: Secondary | ICD-10-CM | POA: Diagnosis not present

## 2011-12-05 DIAGNOSIS — D472 Monoclonal gammopathy: Secondary | ICD-10-CM | POA: Diagnosis not present

## 2011-12-05 DIAGNOSIS — D509 Iron deficiency anemia, unspecified: Secondary | ICD-10-CM | POA: Diagnosis not present

## 2011-12-05 DIAGNOSIS — Z23 Encounter for immunization: Secondary | ICD-10-CM | POA: Diagnosis not present

## 2011-12-05 DIAGNOSIS — N186 End stage renal disease: Secondary | ICD-10-CM | POA: Diagnosis not present

## 2011-12-05 DIAGNOSIS — N2581 Secondary hyperparathyroidism of renal origin: Secondary | ICD-10-CM | POA: Diagnosis not present

## 2011-12-08 DIAGNOSIS — D472 Monoclonal gammopathy: Secondary | ICD-10-CM | POA: Diagnosis not present

## 2011-12-08 DIAGNOSIS — N2581 Secondary hyperparathyroidism of renal origin: Secondary | ICD-10-CM | POA: Diagnosis not present

## 2011-12-08 DIAGNOSIS — D509 Iron deficiency anemia, unspecified: Secondary | ICD-10-CM | POA: Diagnosis not present

## 2011-12-08 DIAGNOSIS — Z23 Encounter for immunization: Secondary | ICD-10-CM | POA: Diagnosis not present

## 2011-12-08 DIAGNOSIS — D631 Anemia in chronic kidney disease: Secondary | ICD-10-CM | POA: Diagnosis not present

## 2011-12-08 DIAGNOSIS — N186 End stage renal disease: Secondary | ICD-10-CM | POA: Diagnosis not present

## 2011-12-12 DIAGNOSIS — N039 Chronic nephritic syndrome with unspecified morphologic changes: Secondary | ICD-10-CM | POA: Diagnosis not present

## 2011-12-12 DIAGNOSIS — Z23 Encounter for immunization: Secondary | ICD-10-CM | POA: Diagnosis not present

## 2011-12-12 DIAGNOSIS — D509 Iron deficiency anemia, unspecified: Secondary | ICD-10-CM | POA: Diagnosis not present

## 2011-12-12 DIAGNOSIS — N186 End stage renal disease: Secondary | ICD-10-CM | POA: Diagnosis not present

## 2011-12-12 DIAGNOSIS — D631 Anemia in chronic kidney disease: Secondary | ICD-10-CM | POA: Diagnosis not present

## 2011-12-12 DIAGNOSIS — D472 Monoclonal gammopathy: Secondary | ICD-10-CM | POA: Diagnosis not present

## 2011-12-12 DIAGNOSIS — N2581 Secondary hyperparathyroidism of renal origin: Secondary | ICD-10-CM | POA: Diagnosis not present

## 2011-12-15 DIAGNOSIS — N2581 Secondary hyperparathyroidism of renal origin: Secondary | ICD-10-CM | POA: Diagnosis not present

## 2011-12-15 DIAGNOSIS — Z23 Encounter for immunization: Secondary | ICD-10-CM | POA: Diagnosis not present

## 2011-12-15 DIAGNOSIS — D472 Monoclonal gammopathy: Secondary | ICD-10-CM | POA: Diagnosis not present

## 2011-12-15 DIAGNOSIS — D509 Iron deficiency anemia, unspecified: Secondary | ICD-10-CM | POA: Diagnosis not present

## 2011-12-15 DIAGNOSIS — D631 Anemia in chronic kidney disease: Secondary | ICD-10-CM | POA: Diagnosis not present

## 2011-12-15 DIAGNOSIS — N186 End stage renal disease: Secondary | ICD-10-CM | POA: Diagnosis not present

## 2011-12-17 DIAGNOSIS — D472 Monoclonal gammopathy: Secondary | ICD-10-CM | POA: Diagnosis not present

## 2011-12-17 DIAGNOSIS — Z23 Encounter for immunization: Secondary | ICD-10-CM | POA: Diagnosis not present

## 2011-12-17 DIAGNOSIS — N186 End stage renal disease: Secondary | ICD-10-CM | POA: Diagnosis not present

## 2011-12-17 DIAGNOSIS — N2581 Secondary hyperparathyroidism of renal origin: Secondary | ICD-10-CM | POA: Diagnosis not present

## 2011-12-17 DIAGNOSIS — G894 Chronic pain syndrome: Secondary | ICD-10-CM | POA: Diagnosis not present

## 2011-12-17 DIAGNOSIS — D631 Anemia in chronic kidney disease: Secondary | ICD-10-CM | POA: Diagnosis not present

## 2011-12-17 DIAGNOSIS — D509 Iron deficiency anemia, unspecified: Secondary | ICD-10-CM | POA: Diagnosis not present

## 2011-12-19 DIAGNOSIS — D509 Iron deficiency anemia, unspecified: Secondary | ICD-10-CM | POA: Diagnosis not present

## 2011-12-19 DIAGNOSIS — N2581 Secondary hyperparathyroidism of renal origin: Secondary | ICD-10-CM | POA: Diagnosis not present

## 2011-12-19 DIAGNOSIS — Z23 Encounter for immunization: Secondary | ICD-10-CM | POA: Diagnosis not present

## 2011-12-19 DIAGNOSIS — N039 Chronic nephritic syndrome with unspecified morphologic changes: Secondary | ICD-10-CM | POA: Diagnosis not present

## 2011-12-19 DIAGNOSIS — D472 Monoclonal gammopathy: Secondary | ICD-10-CM | POA: Diagnosis not present

## 2011-12-19 DIAGNOSIS — N186 End stage renal disease: Secondary | ICD-10-CM | POA: Diagnosis not present

## 2011-12-22 DIAGNOSIS — D631 Anemia in chronic kidney disease: Secondary | ICD-10-CM | POA: Diagnosis not present

## 2011-12-22 DIAGNOSIS — Z23 Encounter for immunization: Secondary | ICD-10-CM | POA: Diagnosis not present

## 2011-12-22 DIAGNOSIS — N186 End stage renal disease: Secondary | ICD-10-CM | POA: Diagnosis not present

## 2011-12-22 DIAGNOSIS — D509 Iron deficiency anemia, unspecified: Secondary | ICD-10-CM | POA: Diagnosis not present

## 2011-12-22 DIAGNOSIS — N2581 Secondary hyperparathyroidism of renal origin: Secondary | ICD-10-CM | POA: Diagnosis not present

## 2011-12-22 DIAGNOSIS — D472 Monoclonal gammopathy: Secondary | ICD-10-CM | POA: Diagnosis not present

## 2011-12-23 DIAGNOSIS — N186 End stage renal disease: Secondary | ICD-10-CM | POA: Diagnosis not present

## 2011-12-26 DIAGNOSIS — N186 End stage renal disease: Secondary | ICD-10-CM | POA: Diagnosis not present

## 2011-12-26 DIAGNOSIS — D631 Anemia in chronic kidney disease: Secondary | ICD-10-CM | POA: Diagnosis not present

## 2011-12-26 DIAGNOSIS — N2581 Secondary hyperparathyroidism of renal origin: Secondary | ICD-10-CM | POA: Diagnosis not present

## 2011-12-26 DIAGNOSIS — D509 Iron deficiency anemia, unspecified: Secondary | ICD-10-CM | POA: Diagnosis not present

## 2011-12-26 DIAGNOSIS — D472 Monoclonal gammopathy: Secondary | ICD-10-CM | POA: Diagnosis not present

## 2011-12-26 DIAGNOSIS — Z23 Encounter for immunization: Secondary | ICD-10-CM | POA: Diagnosis not present

## 2011-12-28 ENCOUNTER — Inpatient Hospital Stay (HOSPITAL_COMMUNITY)
Admission: EM | Admit: 2011-12-28 | Discharge: 2012-01-01 | DRG: 085 | Disposition: A | Discharge status: Skilled Nursing Facility | Payer: Medicare Other | Attending: Family Medicine | Admitting: Family Medicine

## 2011-12-28 ENCOUNTER — Inpatient Hospital Stay (HOSPITAL_COMMUNITY): Payer: Medicare Other

## 2011-12-28 ENCOUNTER — Encounter (HOSPITAL_COMMUNITY): Payer: Self-pay | Admitting: *Deleted

## 2011-12-28 ENCOUNTER — Emergency Department (HOSPITAL_COMMUNITY): Payer: Medicare Other

## 2011-12-28 ENCOUNTER — Observation Stay (HOSPITAL_COMMUNITY): Payer: Medicare Other

## 2011-12-28 DIAGNOSIS — S069XAA Unspecified intracranial injury with loss of consciousness status unknown, initial encounter: Secondary | ICD-10-CM | POA: Diagnosis not present

## 2011-12-28 DIAGNOSIS — M25539 Pain in unspecified wrist: Secondary | ICD-10-CM | POA: Diagnosis not present

## 2011-12-28 DIAGNOSIS — F329 Major depressive disorder, single episode, unspecified: Secondary | ICD-10-CM

## 2011-12-28 DIAGNOSIS — S0083XA Contusion of other part of head, initial encounter: Secondary | ICD-10-CM | POA: Diagnosis present

## 2011-12-28 DIAGNOSIS — B20 Human immunodeficiency virus [HIV] disease: Secondary | ICD-10-CM | POA: Diagnosis not present

## 2011-12-28 DIAGNOSIS — S064X0A Epidural hemorrhage without loss of consciousness, initial encounter: Principal | ICD-10-CM | POA: Diagnosis present

## 2011-12-28 DIAGNOSIS — K439 Ventral hernia without obstruction or gangrene: Secondary | ICD-10-CM

## 2011-12-28 DIAGNOSIS — R229 Localized swelling, mass and lump, unspecified: Secondary | ICD-10-CM

## 2011-12-28 DIAGNOSIS — N186 End stage renal disease: Secondary | ICD-10-CM | POA: Diagnosis not present

## 2011-12-28 DIAGNOSIS — I2789 Other specified pulmonary heart diseases: Secondary | ICD-10-CM | POA: Diagnosis present

## 2011-12-28 DIAGNOSIS — I120 Hypertensive chronic kidney disease with stage 5 chronic kidney disease or end stage renal disease: Secondary | ICD-10-CM | POA: Diagnosis not present

## 2011-12-28 DIAGNOSIS — S0003XA Contusion of scalp, initial encounter: Secondary | ICD-10-CM | POA: Diagnosis present

## 2011-12-28 DIAGNOSIS — I629 Nontraumatic intracranial hemorrhage, unspecified: Secondary | ICD-10-CM | POA: Diagnosis present

## 2011-12-28 DIAGNOSIS — K922 Gastrointestinal hemorrhage, unspecified: Secondary | ICD-10-CM

## 2011-12-28 DIAGNOSIS — R197 Diarrhea, unspecified: Secondary | ICD-10-CM

## 2011-12-28 DIAGNOSIS — F3289 Other specified depressive episodes: Secondary | ICD-10-CM

## 2011-12-28 DIAGNOSIS — D631 Anemia in chronic kidney disease: Secondary | ICD-10-CM | POA: Diagnosis present

## 2011-12-28 DIAGNOSIS — D649 Anemia, unspecified: Secondary | ICD-10-CM | POA: Diagnosis present

## 2011-12-28 DIAGNOSIS — K219 Gastro-esophageal reflux disease without esophagitis: Secondary | ICD-10-CM

## 2011-12-28 DIAGNOSIS — Z992 Dependence on renal dialysis: Secondary | ICD-10-CM | POA: Diagnosis not present

## 2011-12-28 DIAGNOSIS — N61 Mastitis without abscess: Secondary | ICD-10-CM

## 2011-12-28 DIAGNOSIS — Y9210 Unspecified residential institution as the place of occurrence of the external cause: Secondary | ICD-10-CM | POA: Diagnosis present

## 2011-12-28 DIAGNOSIS — R51 Headache: Secondary | ICD-10-CM | POA: Diagnosis not present

## 2011-12-28 DIAGNOSIS — J189 Pneumonia, unspecified organism: Secondary | ICD-10-CM

## 2011-12-28 DIAGNOSIS — R599 Enlarged lymph nodes, unspecified: Secondary | ICD-10-CM

## 2011-12-28 DIAGNOSIS — M25559 Pain in unspecified hip: Secondary | ICD-10-CM | POA: Diagnosis not present

## 2011-12-28 DIAGNOSIS — F1010 Alcohol abuse, uncomplicated: Secondary | ICD-10-CM

## 2011-12-28 DIAGNOSIS — R6889 Other general symptoms and signs: Secondary | ICD-10-CM | POA: Diagnosis not present

## 2011-12-28 DIAGNOSIS — N289 Disorder of kidney and ureter, unspecified: Secondary | ICD-10-CM

## 2011-12-28 DIAGNOSIS — M8569 Other cyst of bone, multiple sites: Secondary | ICD-10-CM | POA: Diagnosis not present

## 2011-12-28 DIAGNOSIS — S0990XA Unspecified injury of head, initial encounter: Secondary | ICD-10-CM | POA: Diagnosis not present

## 2011-12-28 DIAGNOSIS — I509 Heart failure, unspecified: Secondary | ICD-10-CM

## 2011-12-28 DIAGNOSIS — E8770 Fluid overload, unspecified: Secondary | ICD-10-CM

## 2011-12-28 DIAGNOSIS — M25529 Pain in unspecified elbow: Secondary | ICD-10-CM | POA: Diagnosis not present

## 2011-12-28 DIAGNOSIS — A0472 Enterocolitis due to Clostridium difficile, not specified as recurrent: Secondary | ICD-10-CM

## 2011-12-28 DIAGNOSIS — S298XXA Other specified injuries of thorax, initial encounter: Secondary | ICD-10-CM | POA: Diagnosis not present

## 2011-12-28 DIAGNOSIS — R52 Pain, unspecified: Secondary | ICD-10-CM | POA: Diagnosis not present

## 2011-12-28 DIAGNOSIS — N2581 Secondary hyperparathyroidism of renal origin: Secondary | ICD-10-CM | POA: Diagnosis present

## 2011-12-28 DIAGNOSIS — I10 Essential (primary) hypertension: Secondary | ICD-10-CM | POA: Diagnosis not present

## 2011-12-28 DIAGNOSIS — K859 Acute pancreatitis without necrosis or infection, unspecified: Secondary | ICD-10-CM

## 2011-12-28 DIAGNOSIS — T783XXA Angioneurotic edema, initial encounter: Secondary | ICD-10-CM

## 2011-12-28 DIAGNOSIS — I6200 Nontraumatic subdural hemorrhage, unspecified: Secondary | ICD-10-CM | POA: Diagnosis not present

## 2011-12-28 DIAGNOSIS — S79929A Unspecified injury of unspecified thigh, initial encounter: Secondary | ICD-10-CM | POA: Diagnosis not present

## 2011-12-28 DIAGNOSIS — G4700 Insomnia, unspecified: Secondary | ICD-10-CM | POA: Diagnosis present

## 2011-12-28 DIAGNOSIS — Z87891 Personal history of nicotine dependence: Secondary | ICD-10-CM

## 2011-12-28 DIAGNOSIS — S79919A Unspecified injury of unspecified hip, initial encounter: Secondary | ICD-10-CM | POA: Diagnosis not present

## 2011-12-28 DIAGNOSIS — Z21 Asymptomatic human immunodeficiency virus [HIV] infection status: Secondary | ICD-10-CM

## 2011-12-28 DIAGNOSIS — R58 Hemorrhage, not elsewhere classified: Secondary | ICD-10-CM | POA: Diagnosis not present

## 2011-12-28 DIAGNOSIS — Z79899 Other long term (current) drug therapy: Secondary | ICD-10-CM

## 2011-12-28 DIAGNOSIS — Y9301 Activity, walking, marching and hiking: Secondary | ICD-10-CM

## 2011-12-28 DIAGNOSIS — Z9079 Acquired absence of other genital organ(s): Secondary | ICD-10-CM

## 2011-12-28 DIAGNOSIS — Z8639 Personal history of other endocrine, nutritional and metabolic disease: Secondary | ICD-10-CM

## 2011-12-28 DIAGNOSIS — N039 Chronic nephritic syndrome with unspecified morphologic changes: Secondary | ICD-10-CM | POA: Diagnosis present

## 2011-12-28 DIAGNOSIS — K2210 Ulcer of esophagus without bleeding: Secondary | ICD-10-CM

## 2011-12-28 DIAGNOSIS — F411 Generalized anxiety disorder: Secondary | ICD-10-CM

## 2011-12-28 DIAGNOSIS — W010XXA Fall on same level from slipping, tripping and stumbling without subsequent striking against object, initial encounter: Secondary | ICD-10-CM | POA: Diagnosis present

## 2011-12-28 DIAGNOSIS — Z8719 Personal history of other diseases of the digestive system: Secondary | ICD-10-CM

## 2011-12-28 DIAGNOSIS — Z87898 Personal history of other specified conditions: Secondary | ICD-10-CM

## 2011-12-28 DIAGNOSIS — S069X9A Unspecified intracranial injury with loss of consciousness of unspecified duration, initial encounter: Secondary | ICD-10-CM | POA: Diagnosis not present

## 2011-12-28 DIAGNOSIS — Z862 Personal history of diseases of the blood and blood-forming organs and certain disorders involving the immune mechanism: Secondary | ICD-10-CM

## 2011-12-28 DIAGNOSIS — J209 Acute bronchitis, unspecified: Secondary | ICD-10-CM

## 2011-12-28 LAB — COMPREHENSIVE METABOLIC PANEL
ALT: 8 U/L (ref 0–35)
Albumin: 3.4 g/dL — ABNORMAL LOW (ref 3.5–5.2)
Alkaline Phosphatase: 99 U/L (ref 39–117)
BUN: 30 mg/dL — ABNORMAL HIGH (ref 6–23)
Potassium: 3.7 mEq/L (ref 3.5–5.1)
Sodium: 137 mEq/L (ref 135–145)
Total Protein: 8.9 g/dL — ABNORMAL HIGH (ref 6.0–8.3)

## 2011-12-28 LAB — CBC WITH DIFFERENTIAL/PLATELET
Basophils Relative: 1 % (ref 0–1)
Eosinophils Absolute: 0 10*3/uL (ref 0.0–0.7)
MCH: 31.7 pg (ref 26.0–34.0)
MCHC: 31.4 g/dL (ref 30.0–36.0)
Neutrophils Relative %: 71 % (ref 43–77)
Platelets: 69 10*3/uL — ABNORMAL LOW (ref 150–400)
RDW: 16.1 % — ABNORMAL HIGH (ref 11.5–15.5)

## 2011-12-28 LAB — RENAL FUNCTION PANEL
BUN: 31 mg/dL — ABNORMAL HIGH (ref 6–23)
CO2: 25 mEq/L (ref 19–32)
Calcium: 8.4 mg/dL (ref 8.4–10.5)
Creatinine, Ser: 10.37 mg/dL — ABNORMAL HIGH (ref 0.50–1.10)
GFR calc non Af Amer: 4 mL/min — ABNORMAL LOW (ref >90)

## 2011-12-28 LAB — PROTIME-INR: Prothrombin Time: 14.4 seconds (ref 11.6–15.2)

## 2011-12-28 LAB — MRSA PCR SCREENING: MRSA by PCR: NEGATIVE

## 2011-12-28 IMAGING — CR DG HIP (WITH OR WITHOUT PELVIS) 2-3V*L*
3 series · 3 of 3 positions shown · non-contrast
Comparison: None.

CLINICAL DATA: Fall, left hip pain.

LEFT HIP - COMPLETE 2+ VIEW

[view not recorded (1 of 3)]
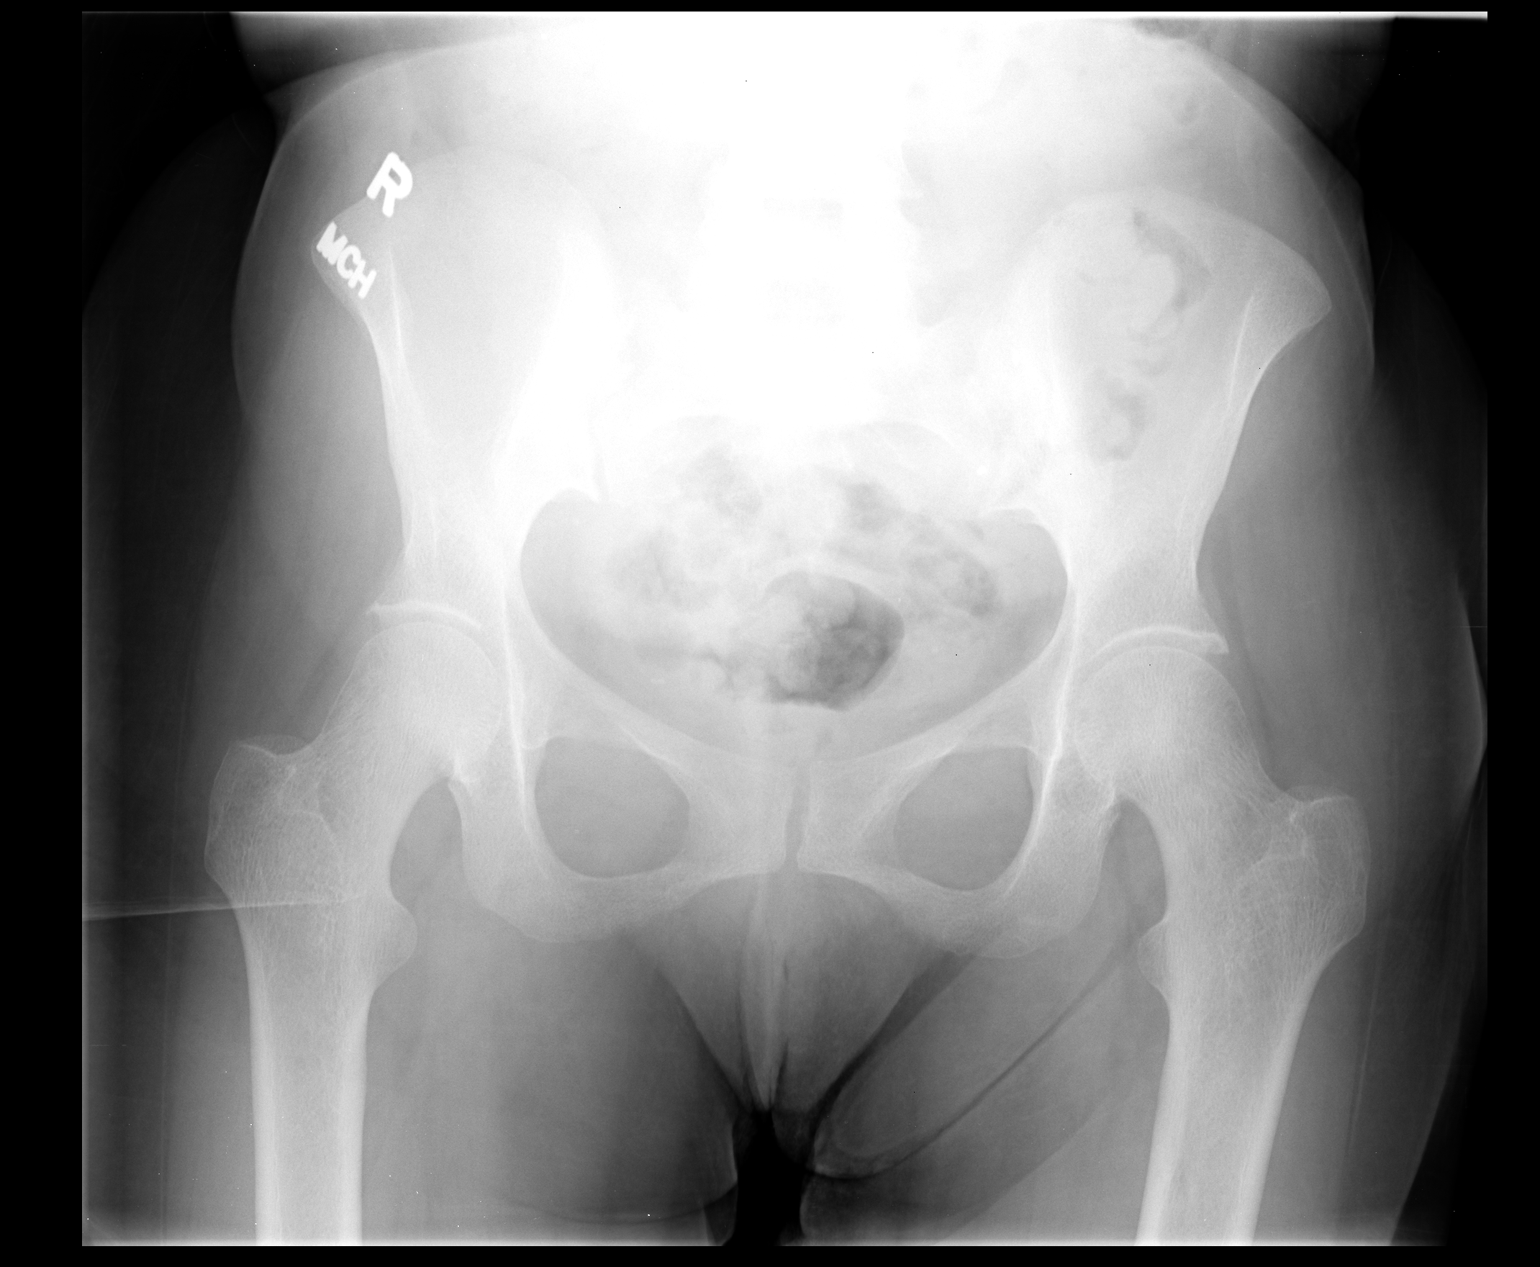

[view not recorded (2 of 3)]
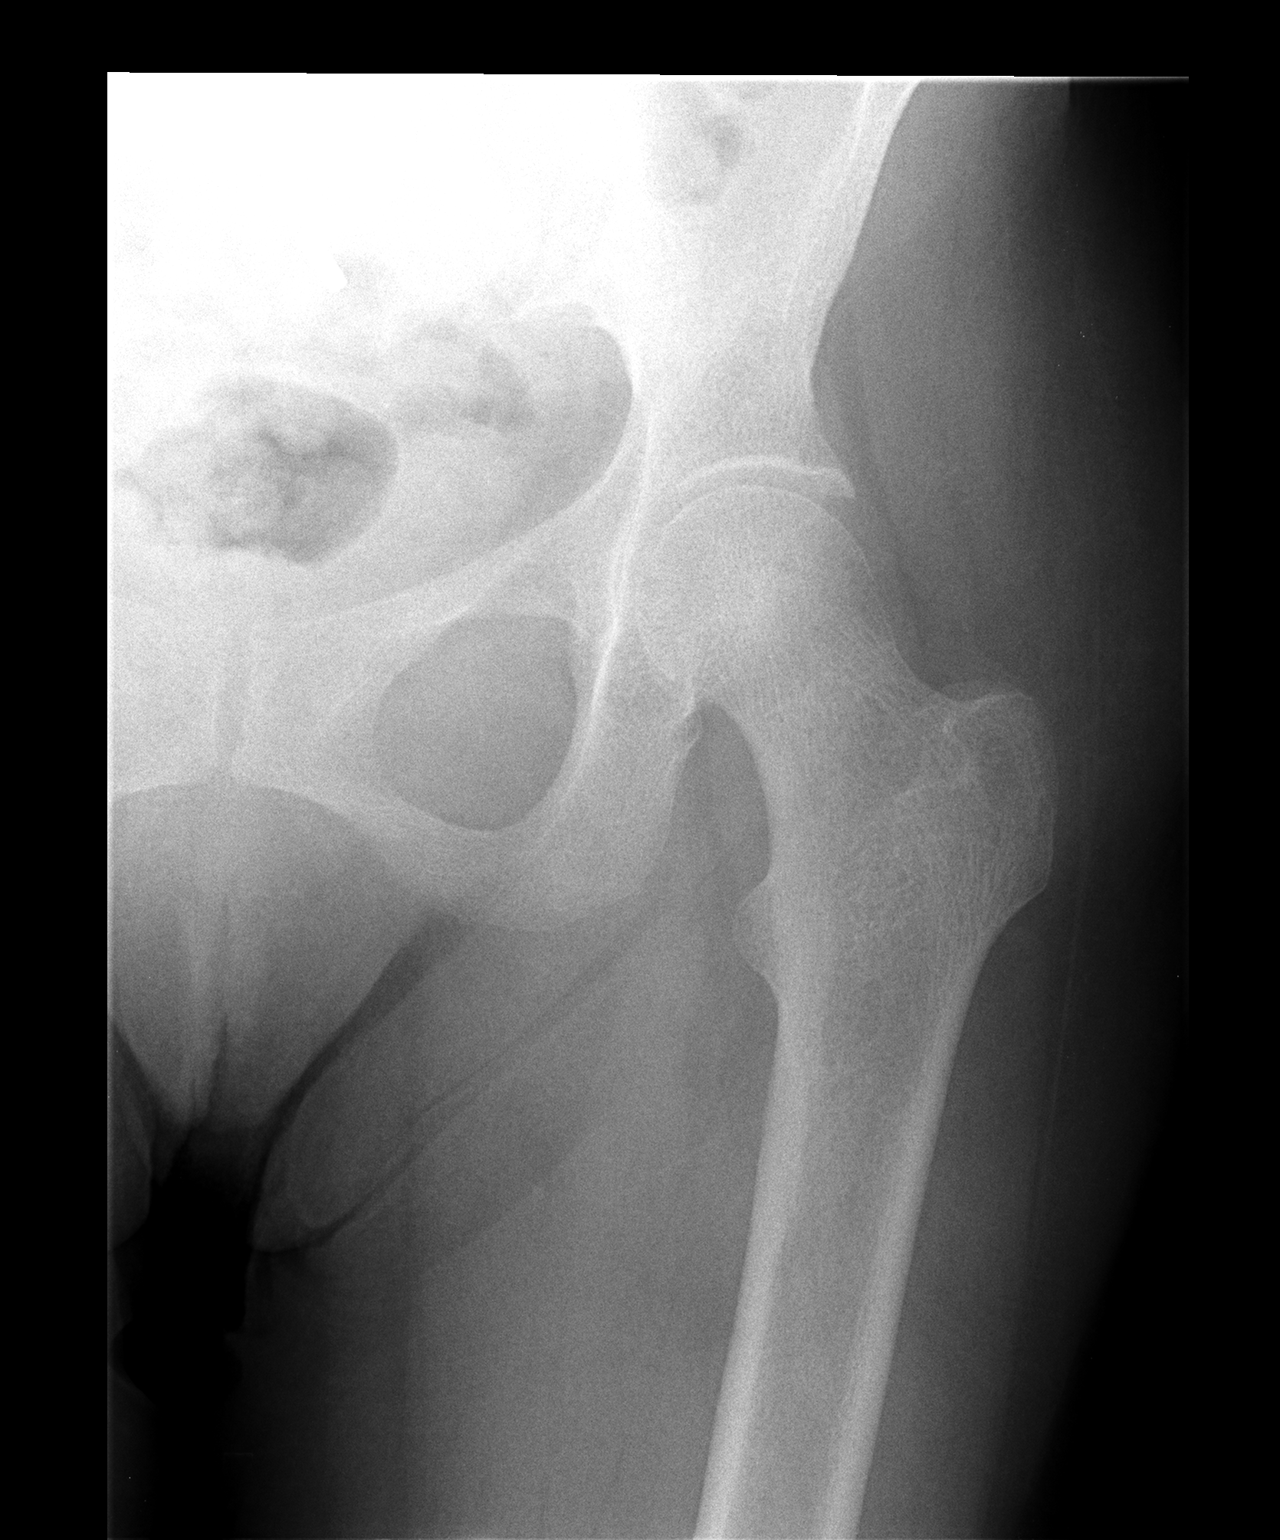

[view not recorded (3 of 3)]
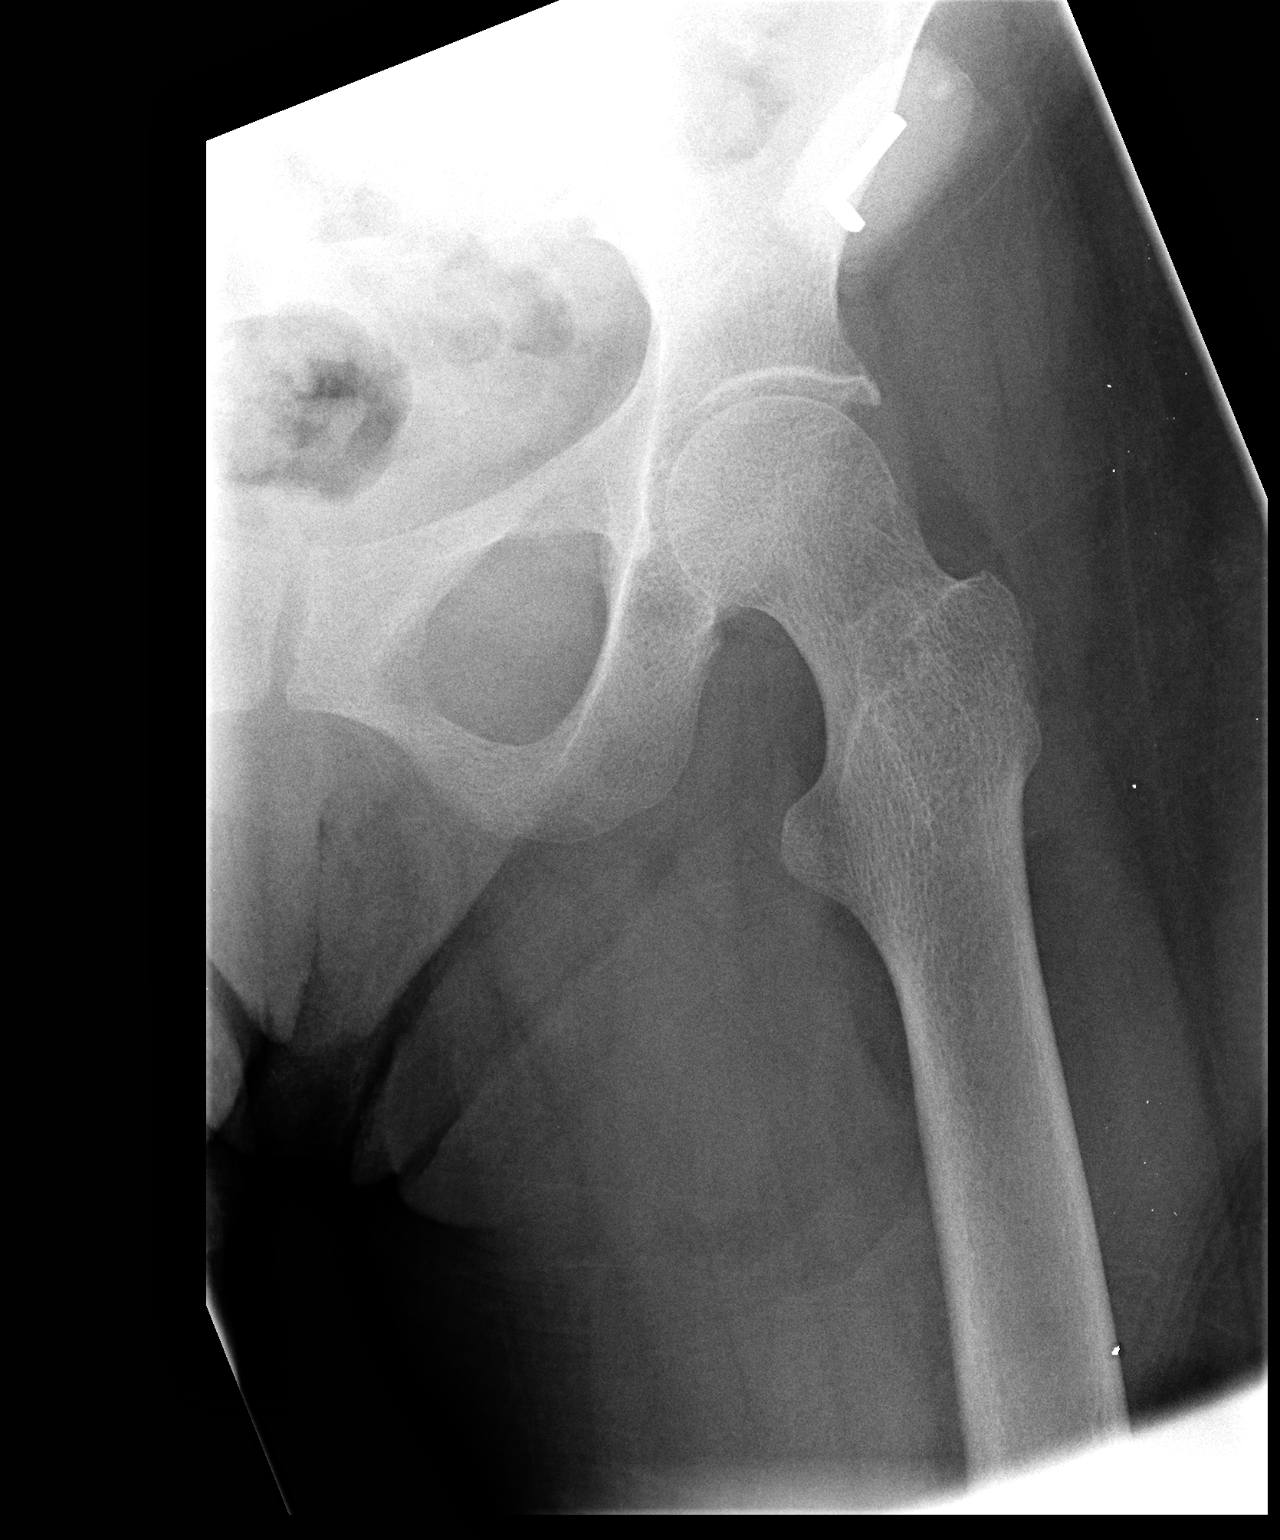

[3 of 3 positions shown; findings below may reference images not displayed]

FINDINGS: Early degenerative changes in the hips bilaterally.  SI
joints are symmetric and unremarkable. No acute bony abnormality.
Specifically, no fracture, subluxation, or dislocation.  Soft
tissues are intact.
IMPRESSION: No acute bony abnormality.

## 2011-12-28 IMAGING — CT CT HEAD W/O CM
1 of 2 series · 16 of 30 positions shown, 20 images · non-contrast
Comparison: CT scan earlier in the day.

CLINICAL DATA: Follow-up intracranial hemorrhage

CT HEAD WITHOUT CONTRAST
TECHNIQUE: Contiguous axial images were obtained from the base of
the skull through the vertex without contrast.

[Series 3: recon 2: brain · axial · 0.47mm/px · z∈[+131,+259]mm · 16 of 56 slices shown, 20 images]
[im 3/56  brain]
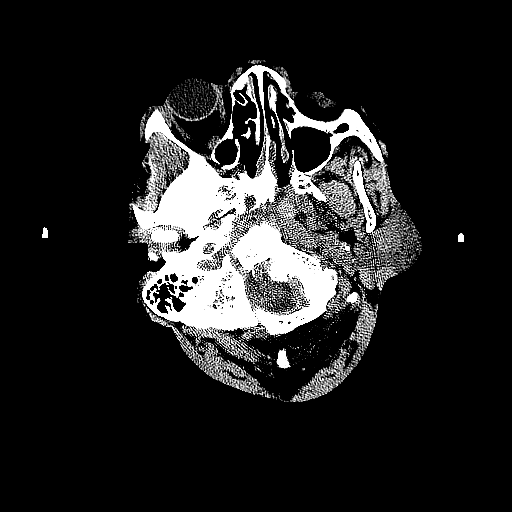
[im 3/56  bone]
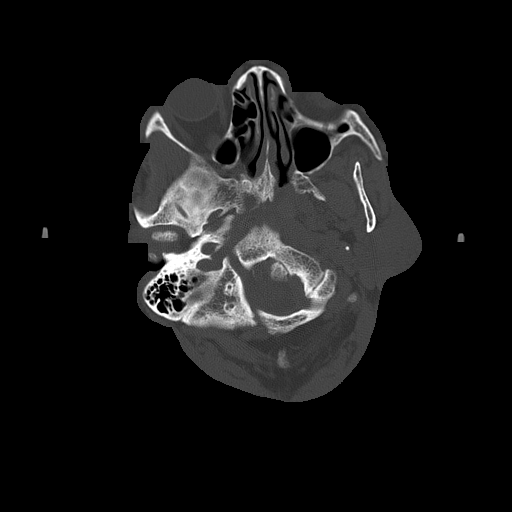
[im 6/56  brain]
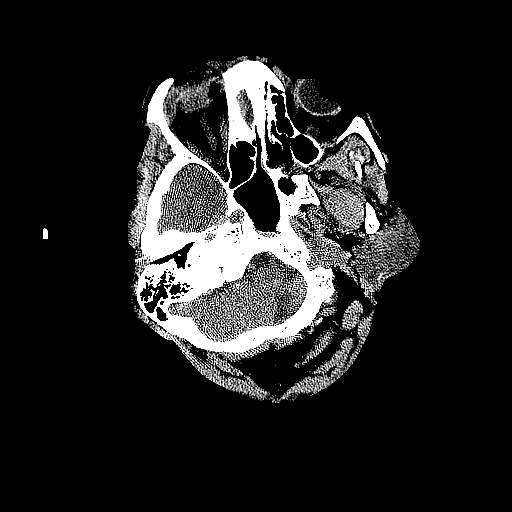
[im 9/56  brain]
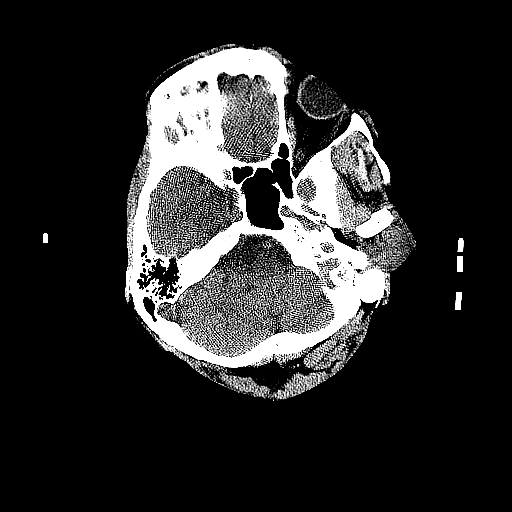
[im 12/56  brain]
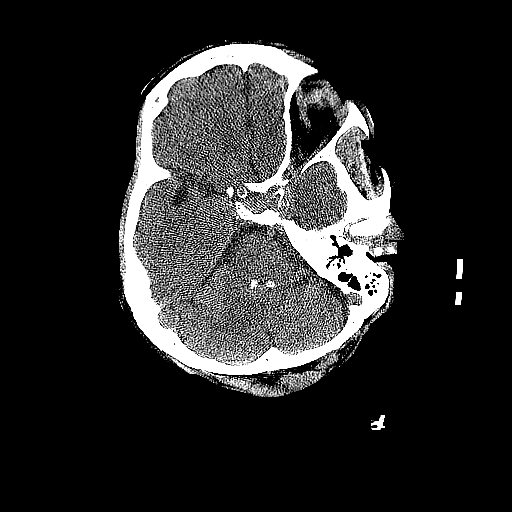
[im 18/56  brain]
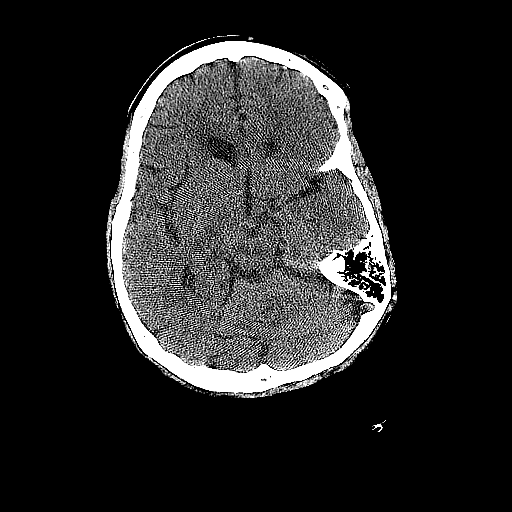
[im 18/56  bone]
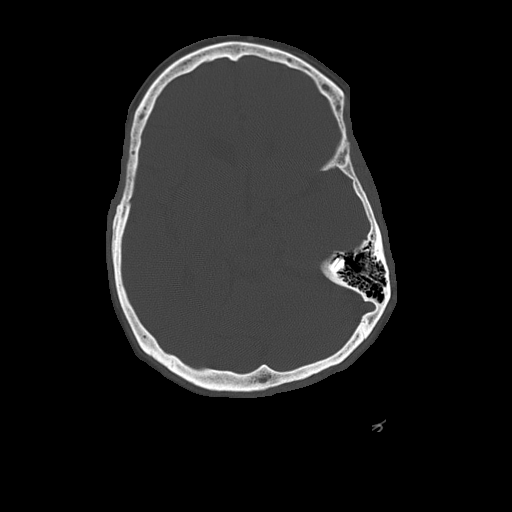
[im 21/56  brain]
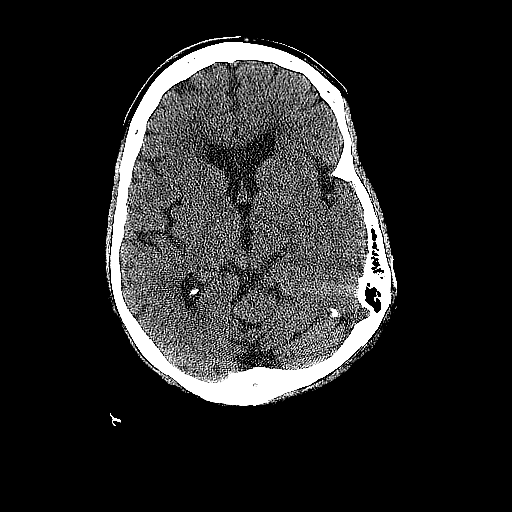
[im 24/56  brain]
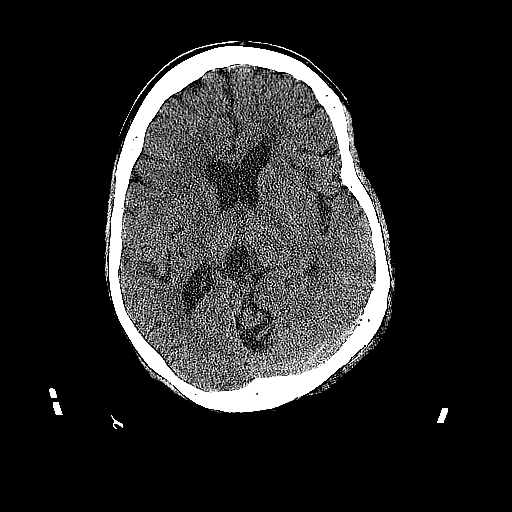
[im 27/56  brain]
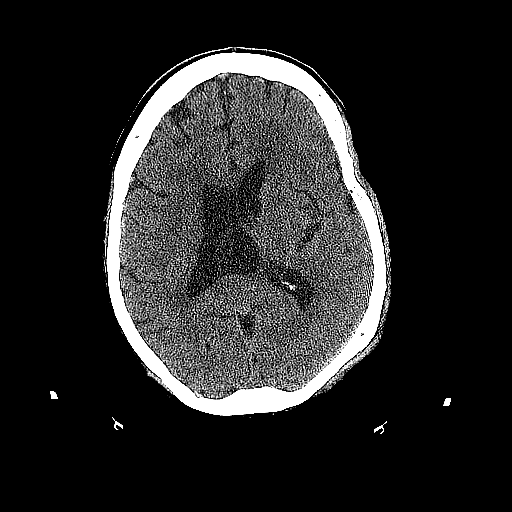
[im 29/56  brain]
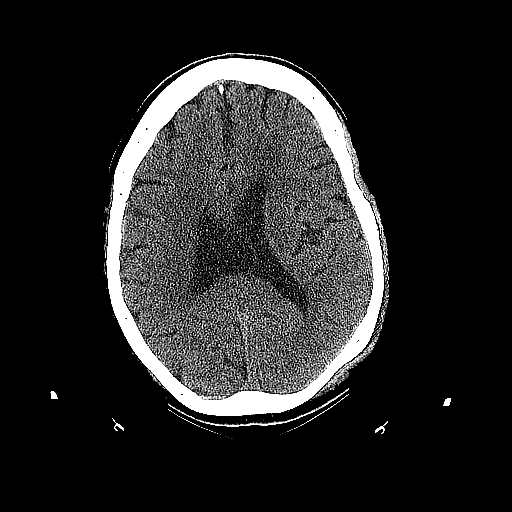
[im 29/56  bone]
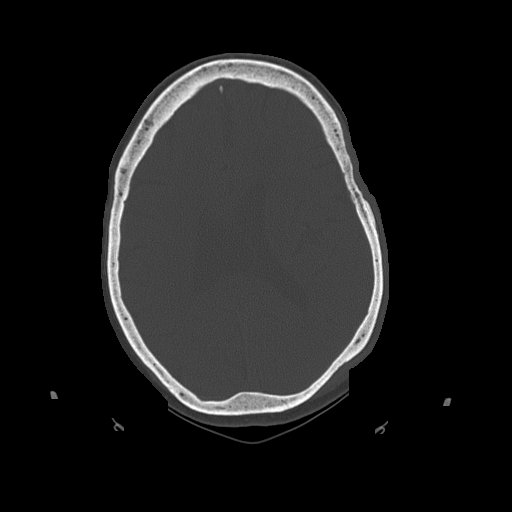
[im 32/56  brain]
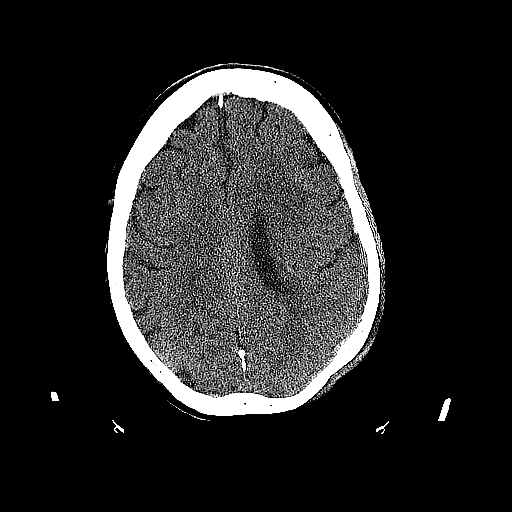
[im 35/56  brain]
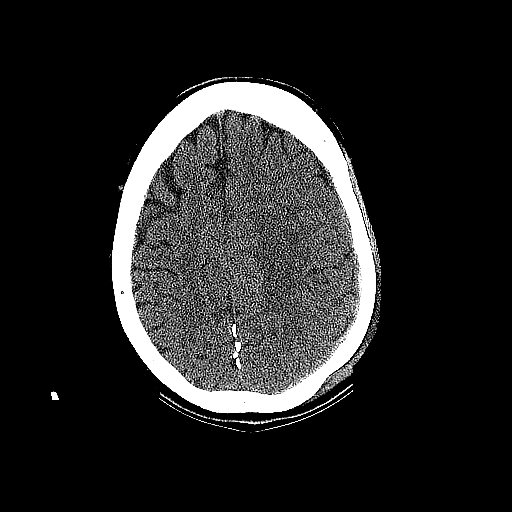
[im 38/56  brain]
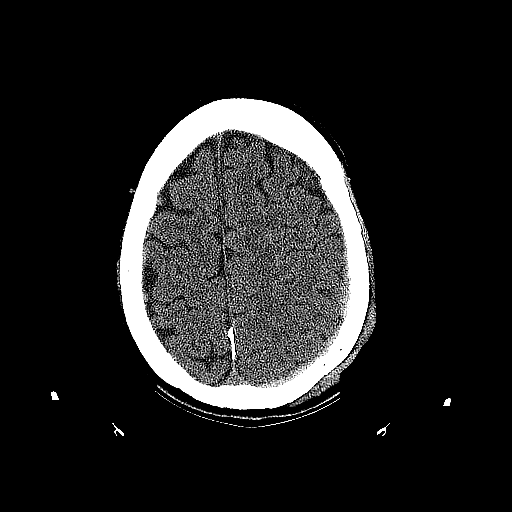
[im 44/56  brain]
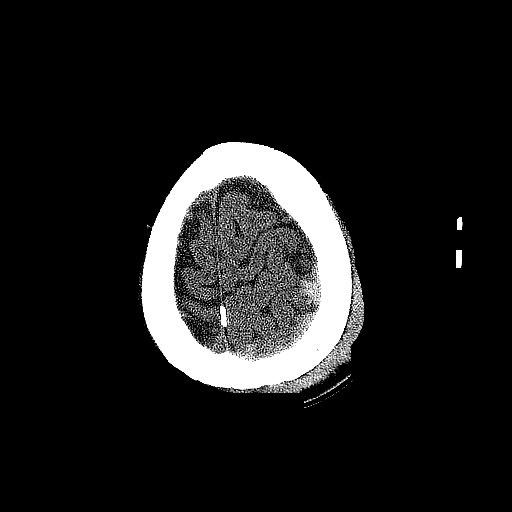
[im 44/56  bone]
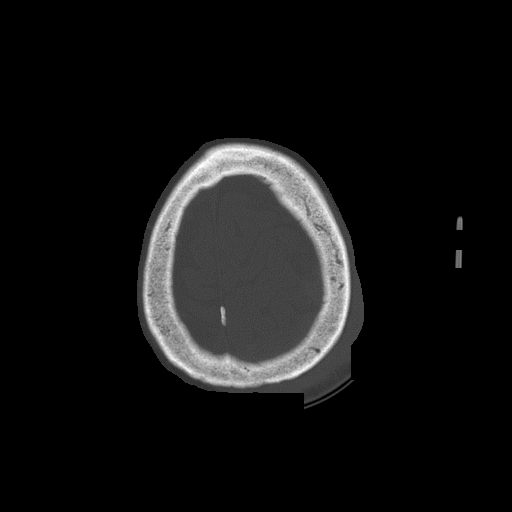
[im 47/56  brain]
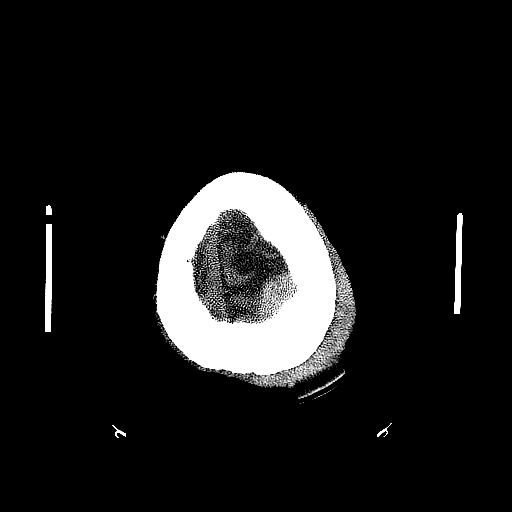
[im 50/56  brain]
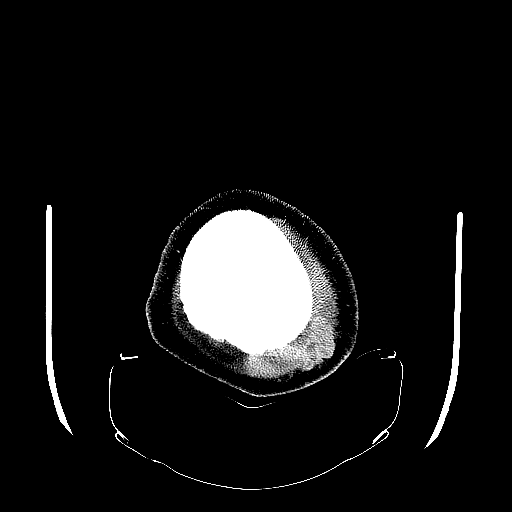
[im 53/56  brain]
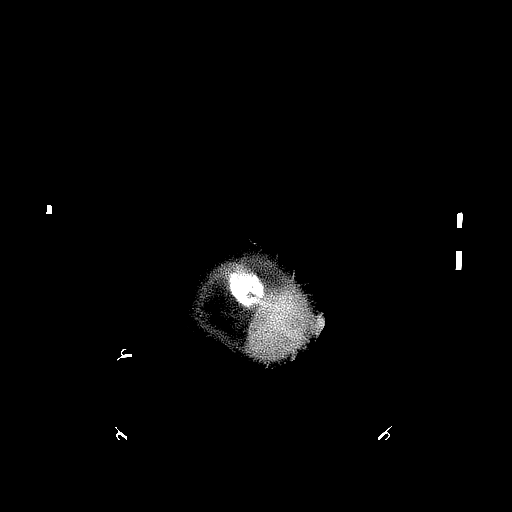

[16 of 30 positions shown; findings below may reference images not displayed]

FINDINGS: Persistent focal left parietal 10 mm thick collection,
biconvex in shape, likely epidural hematoma is redemonstrated and
unchanged from priors.  There is no skull fracture.  Left sided
scalp hematoma remains.

Mild atrophy is present.  There is chronic microvascular ischemic
change.  Cavum septum vergae.  No subarachnoid blood.  No midline
shift.  Clear sinuses and mastoids.
IMPRESSION: Stable left parietal extra-axial collection, likely epidural
hematoma.  No significant increase in size.  Stable scalp hematoma.

## 2011-12-28 IMAGING — CR DG CHEST 1V PORT
1 series · 1 of 1 positions shown · non-contrast
Comparison: [DATE].  Chest CT [DATE].

CLINICAL DATA: Fall, hypertension.

PORTABLE CHEST - 1 VIEW

[view not recorded]
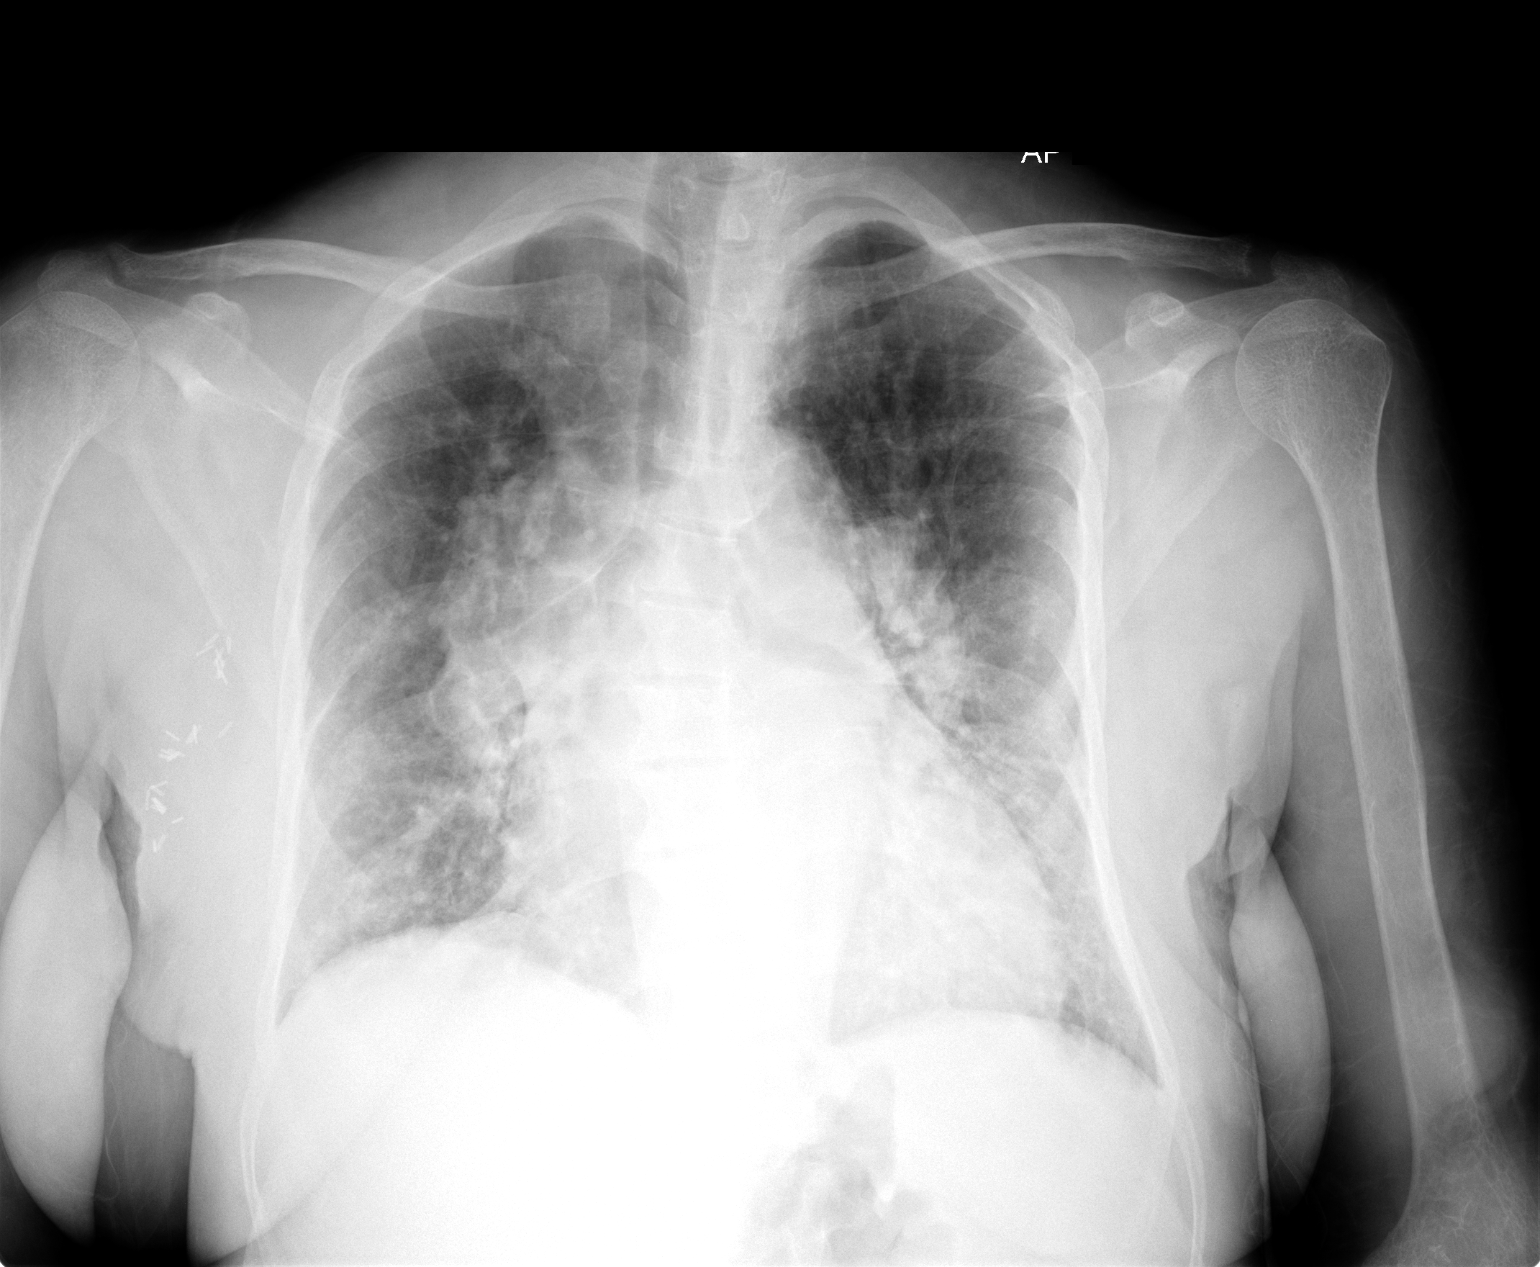

[1 of 1 positions shown; findings below may reference images not displayed]

FINDINGS: Bilateral perihilar fullness again noted. When compared
to prior CT from [GI], this is likely stable and related to
prominent pulmonary arteries, likely pulmonary arterial
hypertension.  No change since prior study.  Mild diffuse
interstitial prominence and cardiomegaly.  No effusions.  No acute
bony abnormality.
IMPRESSION: Stable bilateral perihilar prominence.

Interstitial prominence could reflect interstitial edema.

## 2011-12-28 IMAGING — CT CT HEAD W/O CM
1 of 2 series · 15 of 30 positions shown, 19 images · non-contrast
Comparison: [DATE].

CLINICAL DATA: Fall with left sided head trauma.  Severe left sided
headache.

CT HEAD WITHOUT CONTRAST
TECHNIQUE: Contiguous axial images were obtained from the base of
the skull through the vertex without contrast.

[Series 3: headtrauma 2.4 h60s · axial · 0.43mm/px · z∈[+1348,+1476]mm · 15 of 60 slices shown, 19 images]
[im 4/60  brain]
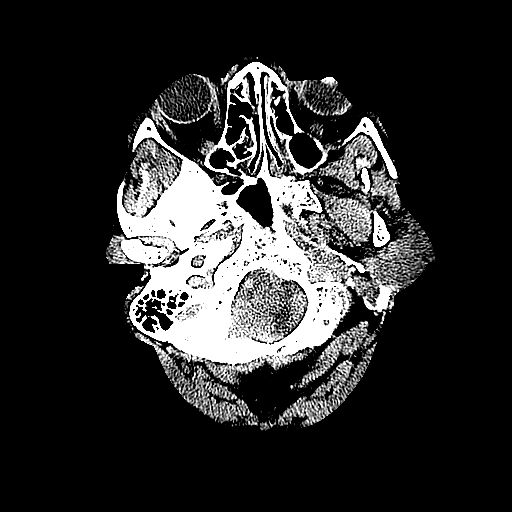
[im 4/60  bone]
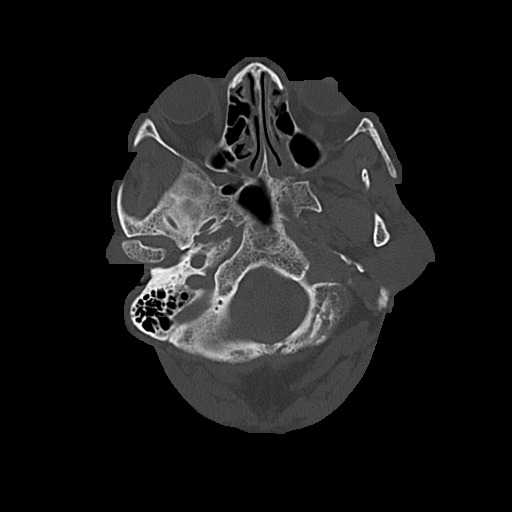
[im 7/60  brain]
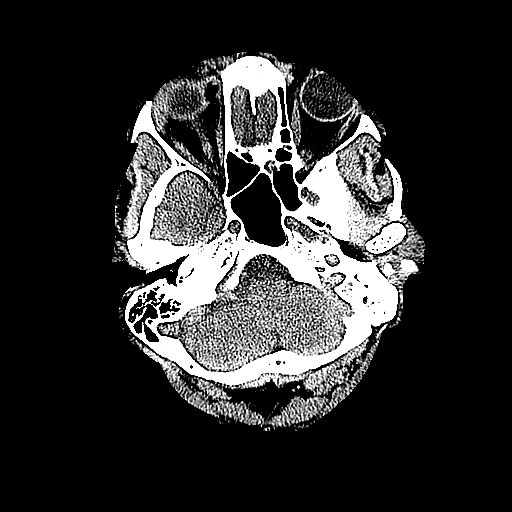
[im 13/60  brain]
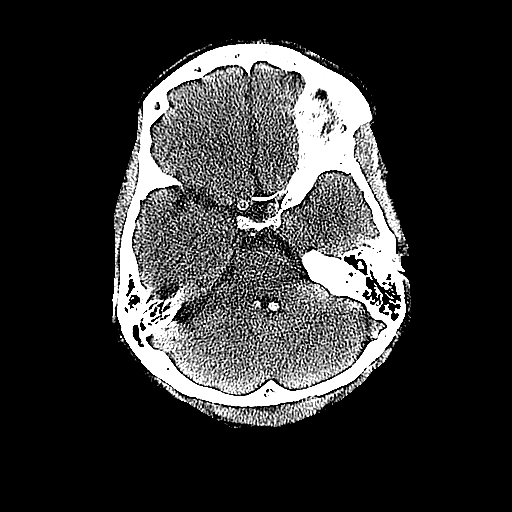
[im 16/60  brain]
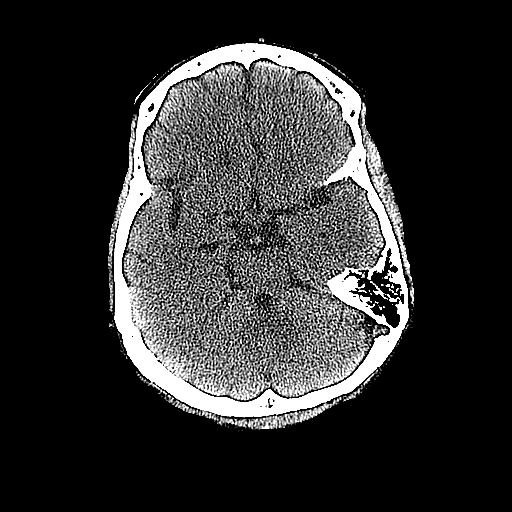
[im 19/60  brain]
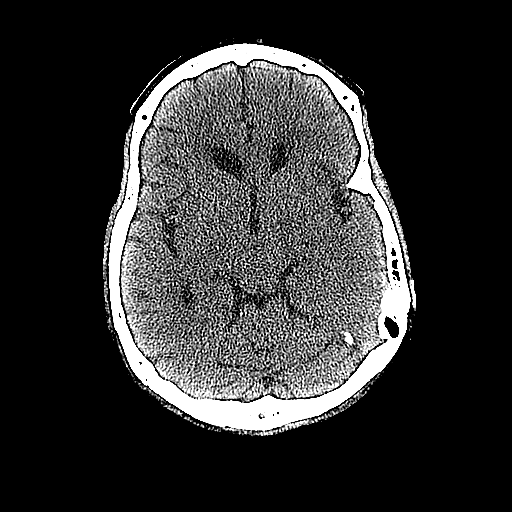
[im 19/60  bone]
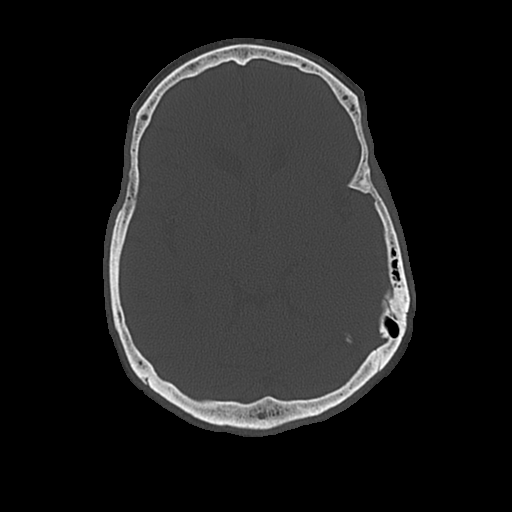
[im 22/60  brain]
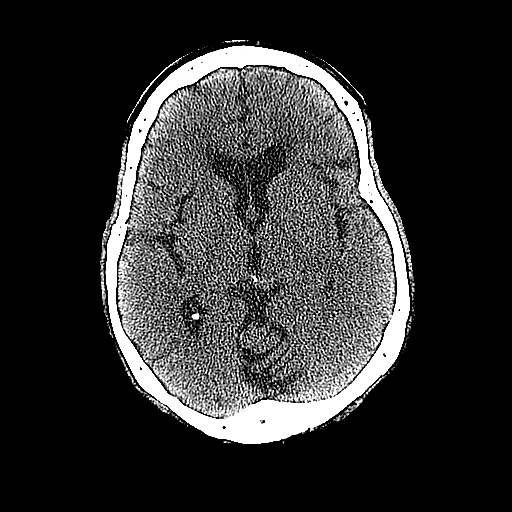
[im 25/60  brain]
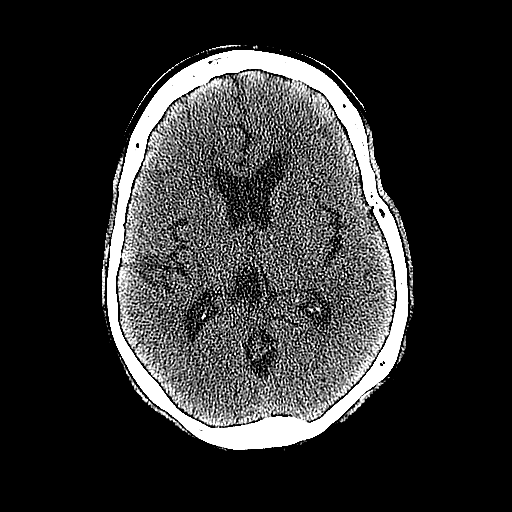
[im 32/60  brain]
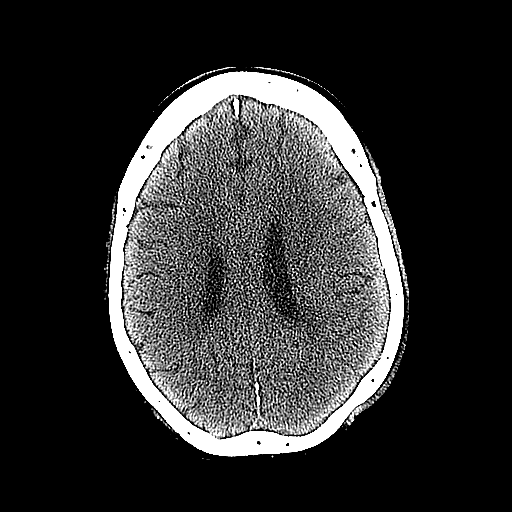
[im 35/60  brain]
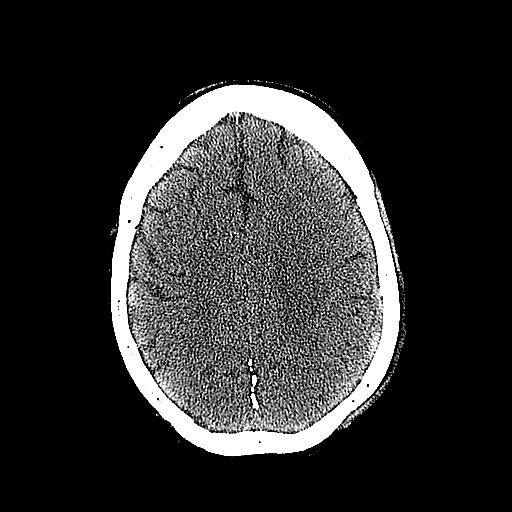
[im 35/60  bone]
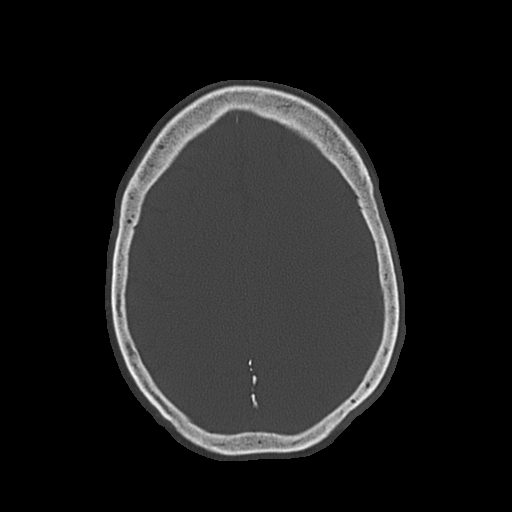
[im 38/60  brain]
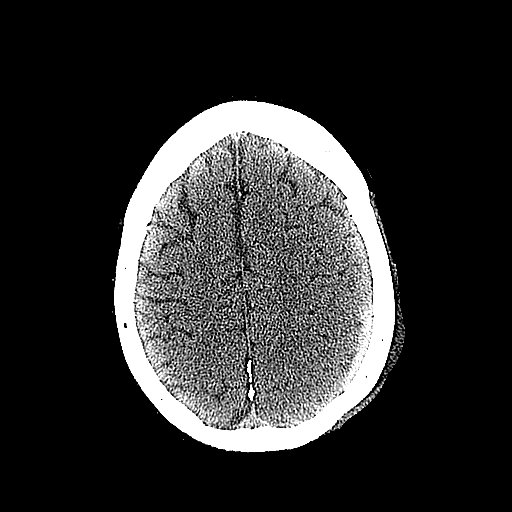
[im 41/60  brain]
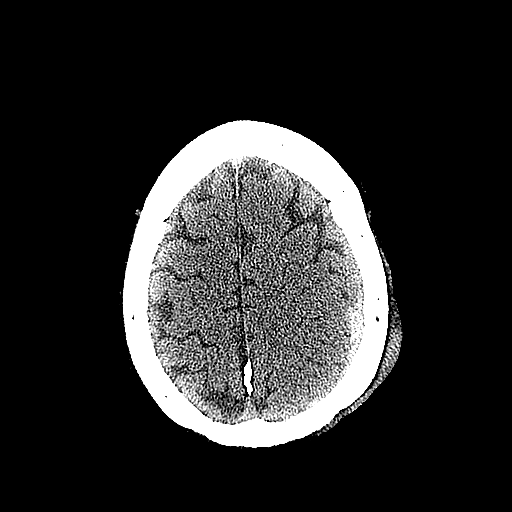
[im 44/60  brain]
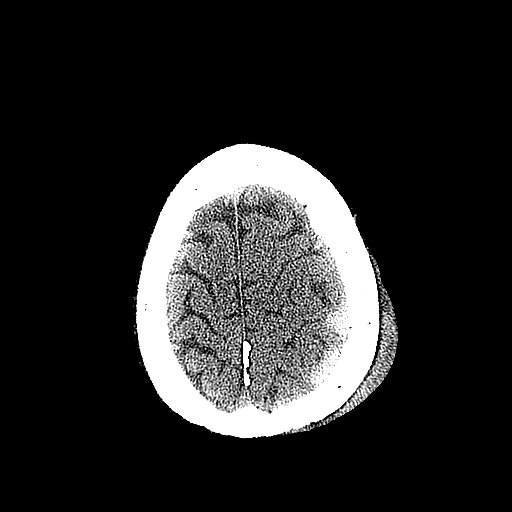
[im 50/60  brain]
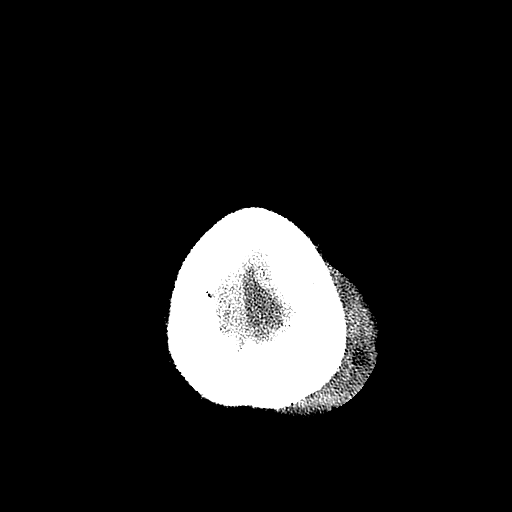
[im 50/60  bone]
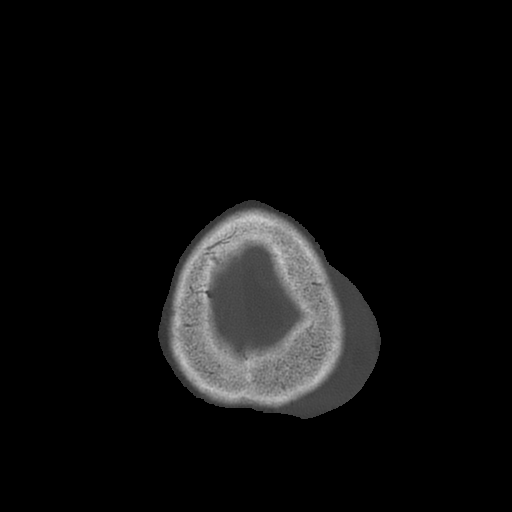
[im 53/60  brain]
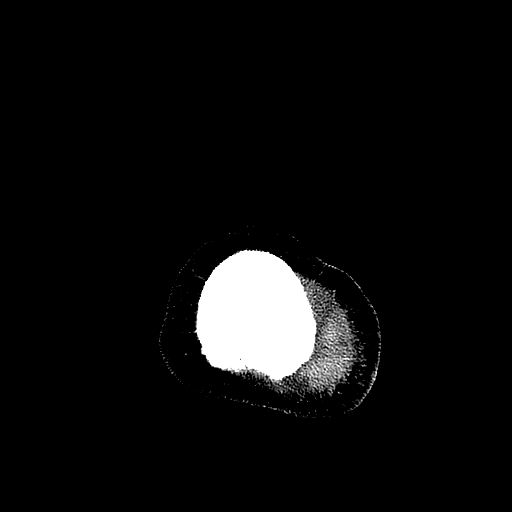
[im 56/60  brain]
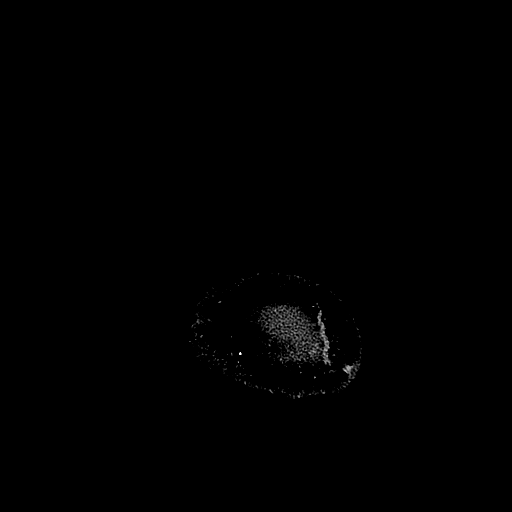

[15 of 30 positions shown; findings below may reference images not displayed]

FINDINGS: High attenuation material is seen along the inner table
of the high left parietal bone, deep to an associated scalp
hematoma.  No significant mass effect.  No evidence of acute
infarct, mass lesion or hydrocephalus.  There is periventricular
low attenuation.  No associated fracture.  No air fluid levels in
the visualized portions of the paranasal sinuses.
IMPRESSION: 1.  Small extra-axial hematoma along the high left parietal lobe,
with overlying scalp hematoma.  No associated fracture. Critical
Value/emergent results were called by telephone at the time of
interpretation on [DATE] at [0S] hours to Dr. ARDENIT, who
verbally acknowledged these results.
2.  Chronic microvascular white matter ischemic changes.

## 2011-12-28 IMAGING — CT CT CERVICAL SPINE W/O CM
3 of 4 series · 11 of 33 positions shown, 13 images · non-contrast
Comparison: CT neck from [DATE].

CLINICAL DATA: Fell today, severe headaches.

CT CERVICAL SPINE WITHOUT CONTRAST
TECHNIQUE: Multidetector CT imaging of the cervical spine was
performed. Multiplanar CT image reconstructions were also
generated.

[Series 3: cervical 2.0 st axial · axial · 0.26mm/px · z∈[+88,+184]mm · 3 of 74 slices shown, 4 images]
[im 13/74  soft-tissue]
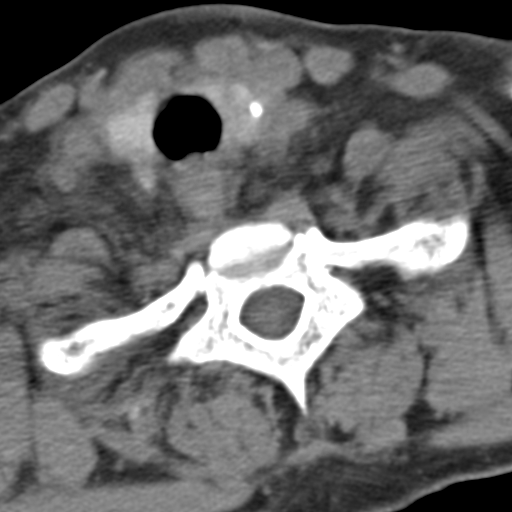
[im 13/74  bone]
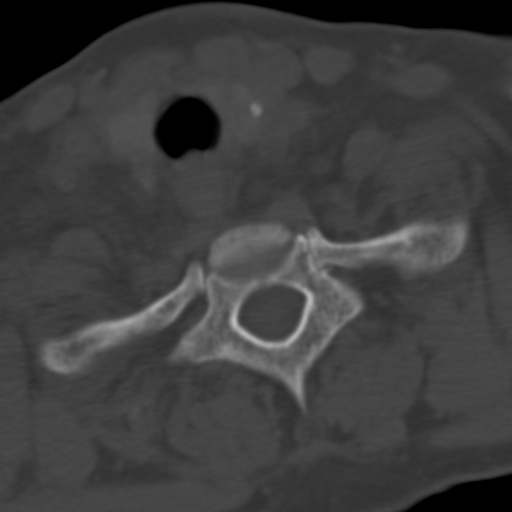
[im 37/74  bone]
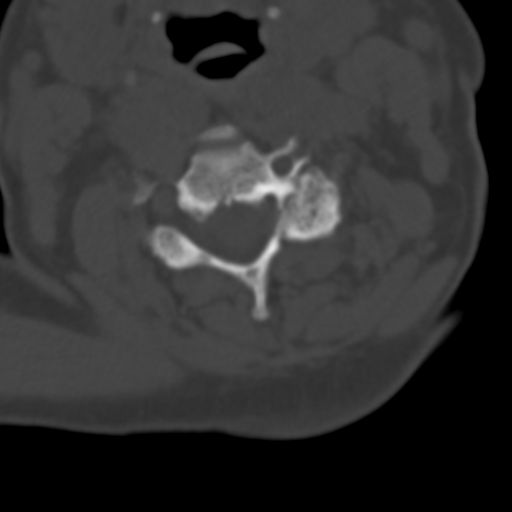
[im 61/74  bone]
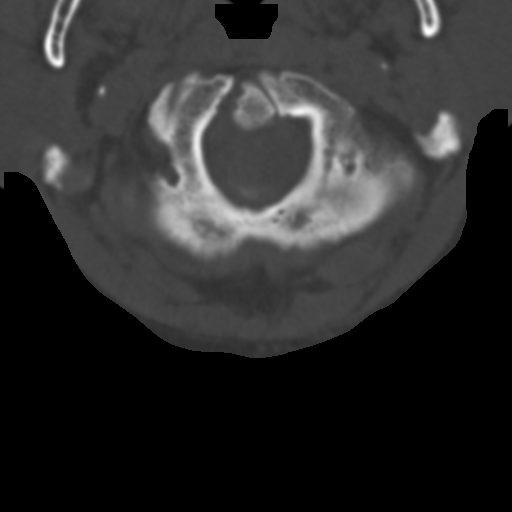

[Series 5: cervical spine sagittal bone · sagittal · 0.17mm/px · 5 of 50 slices shown, 6 images]
[im 17/50  bone]
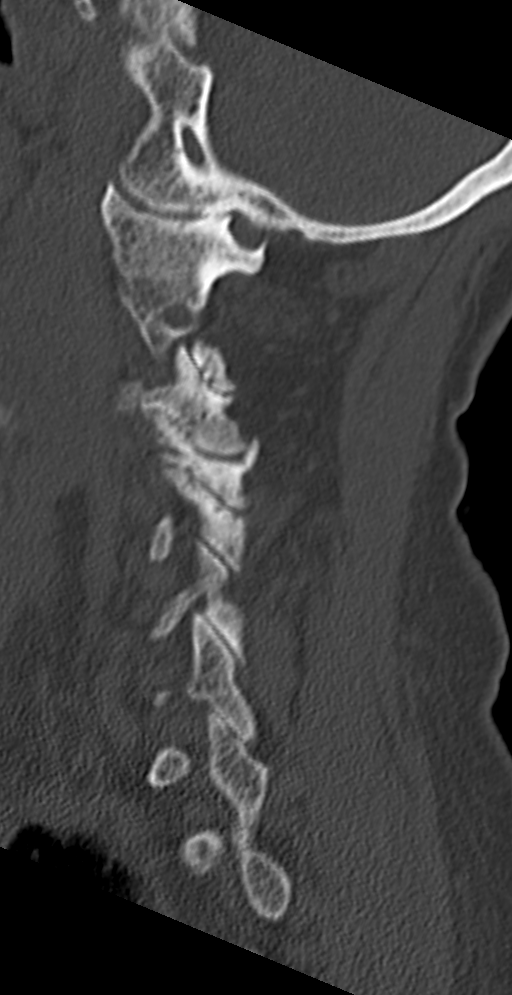
[im 21/50  bone]
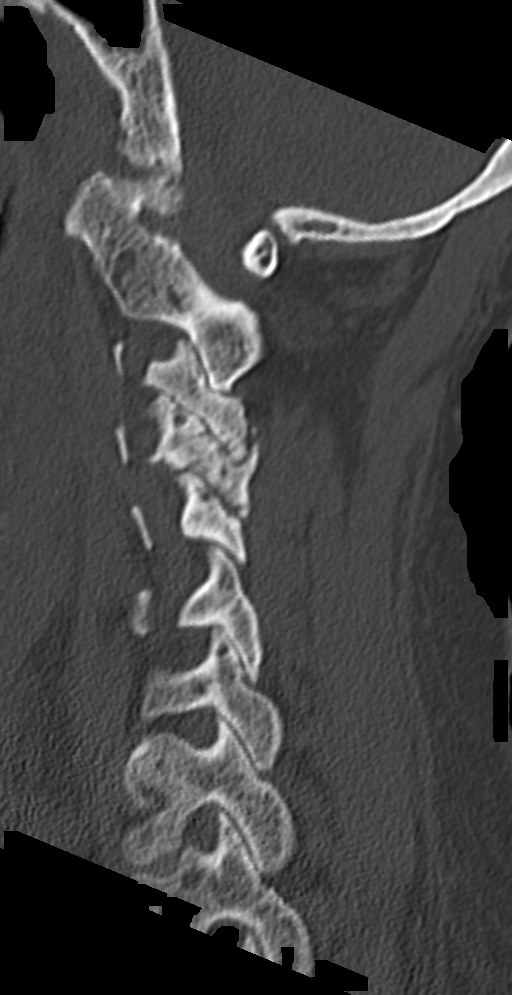
[im 25/50  soft-tissue]
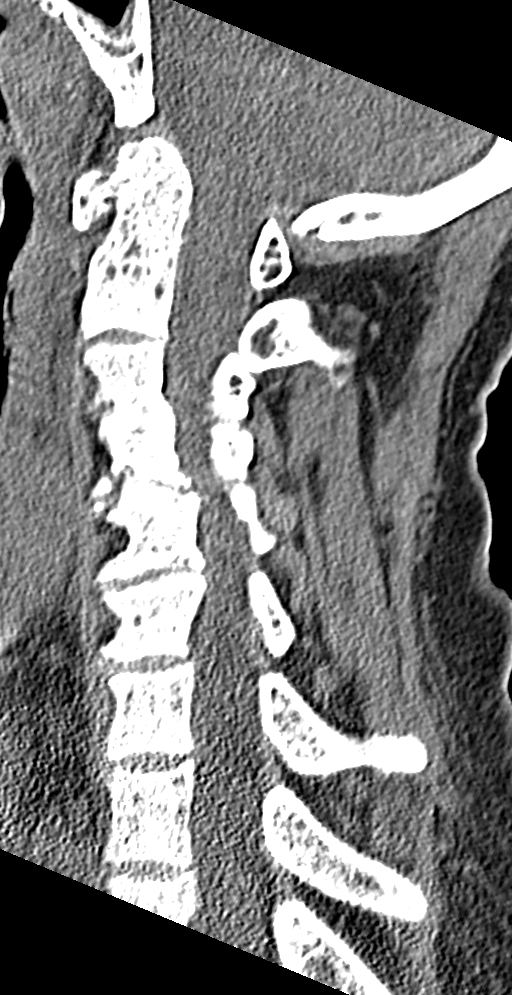
[im 25/50  bone]
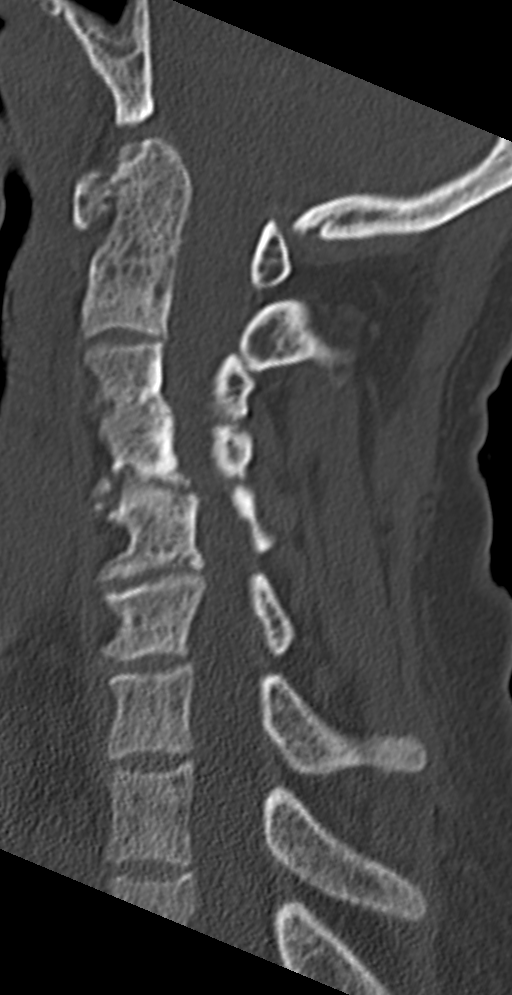
[im 29/50  bone]
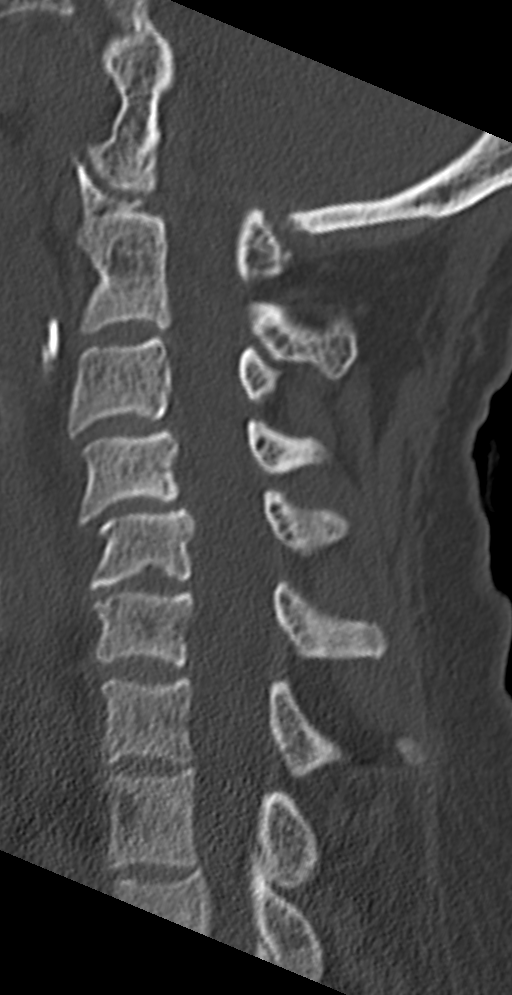
[im 33/50  bone]
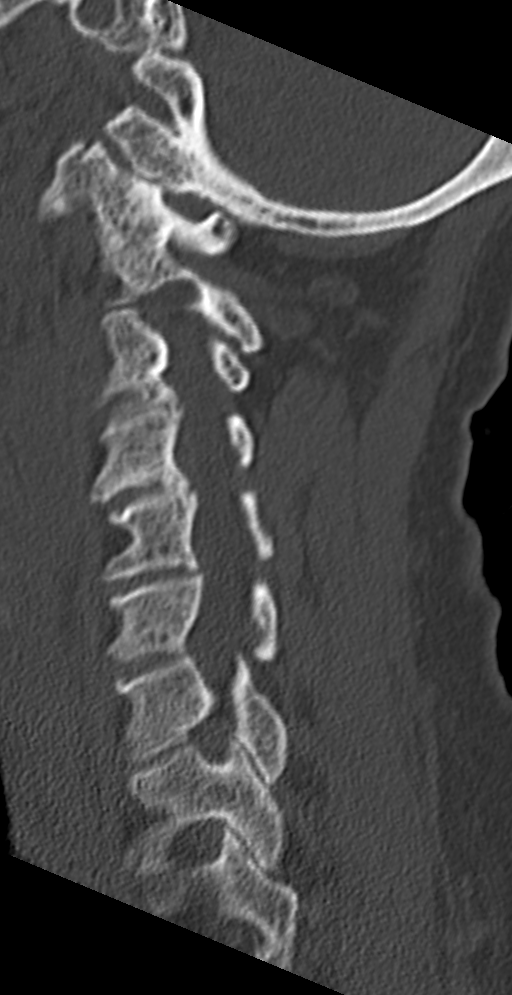

[Series 6: cervical spine coronal bone · coronal · 0.23mm/px · 3 of 40 slices shown]
[im 8/40  bone]
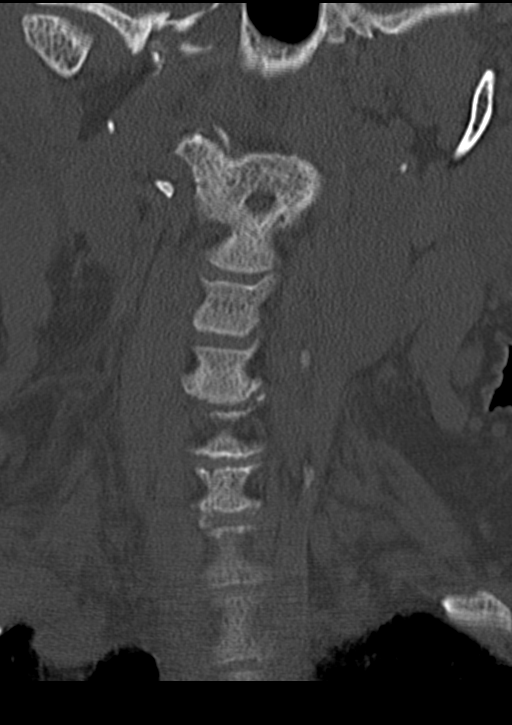
[im 16/40  bone]
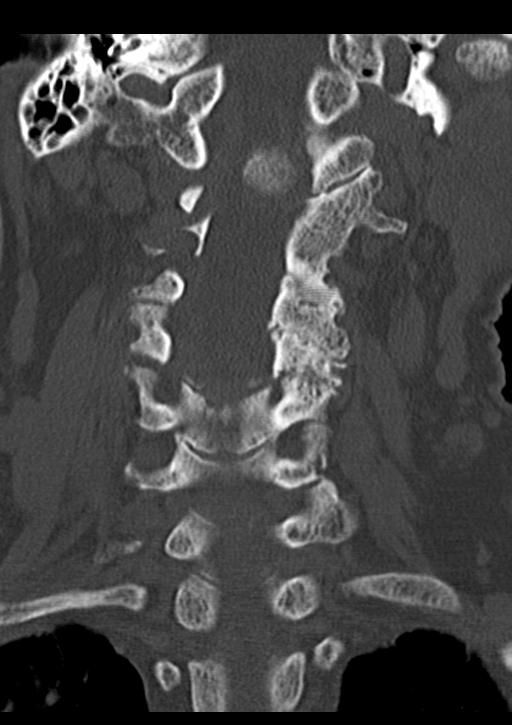
[im 24/40  bone]
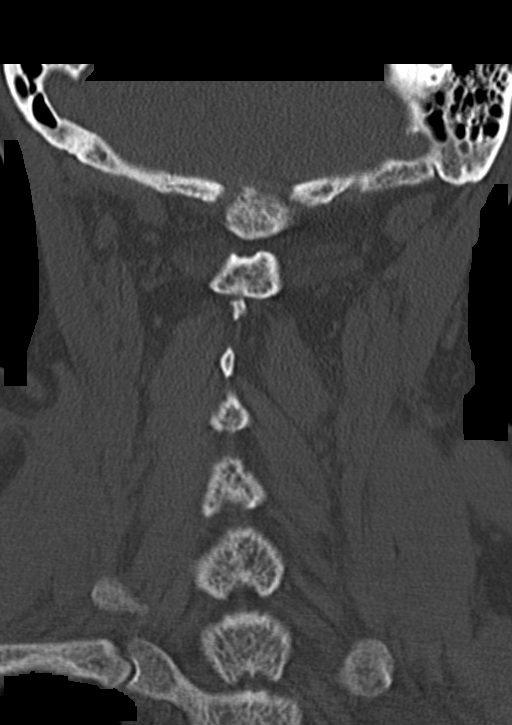

[11 of 33 positions shown; findings below may reference images not displayed]

FINDINGS: There is congenital or degenerative related fusion of C1
and C2 at the odontoid level.  There is severe facet arthropathy at
C3-4 and C4-5 on the left. Moderate facet arthropathy is noted at
C2-3 on the left.  There is 2 mm of facet mediated slip C4-C5.  No
traumatic subluxation is seen.

There is no cervical spine fracture.  No prevertebral soft tissue
swelling.  Lung apices are clear.  Trachea midline.

Fairly well circumscribed lesions are noted in the right division
of C2 and T1. These measure roughly 7 mm in cross section C2 and 5
mm cross-section at T1.  These are likely related to other smaller
lesions noted posterior elements consistent with benign bone cysts.
A similar lesion can be seen in T1 from [S2].
IMPRESSION: Congenital and/or degenerative related fusion C1 and C2.  Advanced
left-sided facet arthropathy.

No cervical spine fracture or traumatic subluxation.

Multiple bones cysts as described.

## 2011-12-28 MED ORDER — RALTEGRAVIR POTASSIUM 400 MG PO TABS
400.0000 mg | ORAL_TABLET | Freq: Two times a day (BID) | ORAL | Status: DC
  Administered 2011-12-28 – 2012-01-01 (×8): 400 mg via ORAL
  Filled 2011-12-28 (×10): qty 1

## 2011-12-28 MED ORDER — ONDANSETRON HCL 4 MG PO TABS
2.0000 mg | ORAL_TABLET | ORAL | Status: DC | PRN
  Filled 2011-12-28: qty 1

## 2011-12-28 MED ORDER — HYPROMELLOSE (GONIOSCOPIC) 2.5 % OP SOLN
1.0000 [drp] | Freq: Two times a day (BID) | OPHTHALMIC | Status: DC
  Filled 2011-12-28 (×2): qty 15

## 2011-12-28 MED ORDER — IPRATROPIUM-ALBUTEROL 0.5-2.5 (3) MG/3ML IN SOLN: 3.0000 mL | Freq: Four times a day (QID) | RESPIRATORY_TRACT | Status: DC | PRN

## 2011-12-28 MED ORDER — SILDENAFIL CITRATE 20 MG PO TABS
20.0000 mg | ORAL_TABLET | Freq: Three times a day (TID) | ORAL | Status: DC
  Administered 2011-12-29 – 2012-01-01 (×11): 20 mg via ORAL
  Filled 2011-12-28 (×14): qty 1

## 2011-12-28 MED ORDER — METOPROLOL TARTRATE 50 MG PO TABS
50.0000 mg | ORAL_TABLET | Freq: Once | ORAL | Status: AC
  Administered 2011-12-28: 50 mg via ORAL
  Filled 2011-12-28 (×2): qty 1

## 2011-12-28 MED ORDER — OXYCODONE HCL 5 MG PO TABS
5.0000 mg | ORAL_TABLET | Freq: Four times a day (QID) | ORAL | Status: DC | PRN
  Administered 2011-12-28 – 2011-12-31 (×6): 5 mg via ORAL
  Filled 2011-12-28 (×5): qty 1

## 2011-12-28 MED ORDER — FENTANYL 25 MCG/HR TD PT72
25.0000 ug | MEDICATED_PATCH | TRANSDERMAL | Status: DC
  Administered 2011-12-30 – 2012-01-01 (×2): 25 ug via TRANSDERMAL
  Filled 2011-12-28 (×2): qty 1

## 2011-12-28 MED ORDER — LORAZEPAM 0.5 MG PO TABS: 0.2500 mg | ORAL_TABLET | Freq: Four times a day (QID) | ORAL | Status: DC | PRN

## 2011-12-28 MED ORDER — ONDANSETRON HCL 4 MG/2ML IJ SOLN
4.0000 mg | Freq: Four times a day (QID) | INTRAMUSCULAR | Status: DC | PRN
  Administered 2011-12-29 – 2011-12-31 (×3): 4 mg via INTRAVENOUS
  Filled 2011-12-28 (×3): qty 2

## 2011-12-28 MED ORDER — WHEY PROTEIN ISOLATE POWD: 1.0000 | Freq: Two times a day (BID) | Status: DC

## 2011-12-28 MED ORDER — ONDANSETRON HCL 4 MG/2ML IJ SOLN
4.0000 mg | Freq: Once | INTRAMUSCULAR | Status: AC
  Administered 2011-12-28: 4 mg via INTRAVENOUS
  Filled 2011-12-28: qty 2

## 2011-12-28 MED ORDER — HYDRALAZINE HCL 20 MG/ML IJ SOLN
10.0000 mg | Freq: Four times a day (QID) | INTRAMUSCULAR | Status: DC | PRN
  Administered 2011-12-28: 10 mg via INTRAVENOUS
  Filled 2011-12-28: qty 0.5
  Filled 2011-12-28: qty 1

## 2011-12-28 MED ORDER — ALBUTEROL SULFATE (5 MG/ML) 0.5% IN NEBU: 2.5000 mg | INHALATION_SOLUTION | Freq: Four times a day (QID) | RESPIRATORY_TRACT | Status: DC | PRN

## 2011-12-28 MED ORDER — PRO-STAT SUGAR FREE PO LIQD
30.0000 mL | Freq: Two times a day (BID) | ORAL | Status: DC
  Administered 2011-12-29: 30 mL via ORAL
  Filled 2011-12-28 (×9): qty 30

## 2011-12-28 MED ORDER — IPRATROPIUM BROMIDE 0.02 % IN SOLN: 0.5000 mg | Freq: Four times a day (QID) | RESPIRATORY_TRACT | Status: DC | PRN

## 2011-12-28 MED ORDER — LIDOCAINE 5 % EX PTCH
1.0000 | MEDICATED_PATCH | CUTANEOUS | Status: DC
  Filled 2011-12-28: qty 1

## 2011-12-28 MED ORDER — RENA-VITE PO TABS
1.0000 | ORAL_TABLET | Freq: Every day | ORAL | Status: DC
  Administered 2011-12-30 – 2012-01-01 (×3): 1 via ORAL
  Filled 2011-12-28 (×5): qty 1

## 2011-12-28 MED ORDER — SODIUM CHLORIDE 0.9 % IJ SOLN
3.0000 mL | Freq: Two times a day (BID) | INTRAMUSCULAR | Status: DC
  Administered 2011-12-28 – 2012-01-01 (×9): 3 mL via INTRAVENOUS

## 2011-12-28 MED ORDER — CINACALCET HCL 30 MG PO TABS
30.0000 mg | ORAL_TABLET | Freq: Every day | ORAL | Status: DC
  Administered 2011-12-28 – 2011-12-31 (×3): 30 mg via ORAL
  Filled 2011-12-28 (×5): qty 1

## 2011-12-28 MED ORDER — LANTHANUM CARBONATE 500 MG PO CHEW
500.0000 mg | CHEWABLE_TABLET | Freq: Every day | ORAL | Status: DC
  Filled 2011-12-28 (×2): qty 1

## 2011-12-28 MED ORDER — LIDOCAINE 5 % EX PTCH
1.0000 | MEDICATED_PATCH | CUTANEOUS | Status: DC
  Administered 2011-12-29 – 2012-01-01 (×4): 1 via TRANSDERMAL
  Filled 2011-12-28 (×5): qty 1

## 2011-12-28 MED ORDER — ZIDOVUDINE 100 MG PO CAPS
100.0000 mg | ORAL_CAPSULE | Freq: Three times a day (TID) | ORAL | Status: DC
  Administered 2011-12-29 – 2012-01-01 (×11): 100 mg via ORAL
  Filled 2011-12-28 (×14): qty 1

## 2011-12-28 MED ORDER — LAMIVUDINE 100 MG PO TABS
50.0000 mg | ORAL_TABLET | Freq: Every day | ORAL | Status: DC
  Filled 2011-12-28: qty 1

## 2011-12-28 MED ORDER — POLYVINYL ALCOHOL 1.4 % OP SOLN
1.0000 [drp] | Freq: Two times a day (BID) | OPHTHALMIC | Status: DC
  Administered 2011-12-28 – 2012-01-01 (×7): 1 [drp] via OPHTHALMIC
  Filled 2011-12-28: qty 15

## 2011-12-28 MED ORDER — HYDROMORPHONE HCL PF 1 MG/ML IJ SOLN
1.0000 mg | Freq: Once | INTRAMUSCULAR | Status: AC
  Administered 2011-12-28: 1 mg via INTRAVENOUS
  Filled 2011-12-28: qty 1

## 2011-12-28 MED ORDER — LANTHANUM CARBONATE 500 MG PO CHEW
1500.0000 mg | CHEWABLE_TABLET | Freq: Three times a day (TID) | ORAL | Status: DC
  Administered 2011-12-29: 1500 mg via ORAL
  Filled 2011-12-28 (×5): qty 3

## 2011-12-28 MED ORDER — AMLODIPINE BESYLATE 10 MG PO TABS
10.0000 mg | ORAL_TABLET | Freq: Every day | ORAL | Status: DC
  Administered 2011-12-28 – 2011-12-29 (×2): 10 mg via ORAL
  Filled 2011-12-28 (×2): qty 1

## 2011-12-28 MED ORDER — SODIUM CHLORIDE 0.9 % IV SOLN
INTRAVENOUS | Status: DC
  Administered 2011-12-28: 12:00:00 via INTRAVENOUS

## 2011-12-28 MED ORDER — ALUM & MAG HYDROXIDE-SIMETH 200-200-20 MG/5ML PO SUSP: 30.0000 mL | Freq: Four times a day (QID) | ORAL | Status: DC | PRN

## 2011-12-28 MED ORDER — PANTOPRAZOLE SODIUM 40 MG PO TBEC
40.0000 mg | DELAYED_RELEASE_TABLET | Freq: Two times a day (BID) | ORAL | Status: DC
  Administered 2011-12-28 – 2012-01-01 (×9): 40 mg via ORAL
  Filled 2011-12-28 (×8): qty 1

## 2011-12-28 MED ORDER — VITAMIN C 500 MG PO TABS: 500.0000 mg | ORAL_TABLET | Freq: Two times a day (BID) | ORAL | Status: DC

## 2011-12-28 NOTE — Progress Notes (Signed)
Received patient from [facility] ED via [place].  Patient's neuro status WNL: AOx3 and can move all extremities, grips equal, tongue midline, smile equal, pupils equal and reactive to light.  Patient VVS except that BP's are elevated.  MD notified of patient's arrival and are awaiting for further orders. Patient oriented to unit and routines.  [name] patient's family notified that patient is in room 3306.  Patient given call bell with instructions and bell alarm set.  Will contiune to monitor.

## 2011-12-28 NOTE — ED Notes (Signed)
Pt tearful, c/o head pain, edp notified.  Lights turned down for pt comfort and given towel to place over eyes.

## 2011-12-28 NOTE — ED Provider Notes (Signed)
History   This chart was scribed for Mervin Kung, MD by Hampton Abbot. This patient was seen in room APA07/APA07 and the patient's care was started at 10:56.   CSN: [id number]  Arrival date & time 12/28/11  1027   First MD Initiated Contact with Patient 12/28/11 1047      Chief Complaint  Patient presents with  . Fall    (Consider location/radiation/quality/duration/timing/severity/associated sxs/prior treatment) The history is provided by the patient. No language interpreter was used.   [name] is a 61 y.o. female brought in by ambulance from [facility] to the Emergency Department complaining of left hip and left-side head pain after tripping and falling on concrete ground while walking.  Pt denies dizziness or light-headedness before fall and denies LOC and any further injuries as result of the fall.  Pt undergoes dialysis Tuesday, Thursday, Saturday.  Pt has h/o HTN and HIV.  Pt denies tobacco and alcohol use.    Past Medical History  Diagnosis Date  . Pulmonary HTN   . Hypertension   . Depression   . Insomnia   . Chronic diarrhea   . HIV (human immunodeficiency virus infection)   . ESRD on hemodialysis   . C. difficile colitis 10/10/11    Past Surgical History  Procedure Date  . Dialysis shunts   . Esophagogastroduodenoscopy 12/15/2010    Procedure: ESOPHAGOGASTRODUODENOSCOPY (EGD);  Surgeon: Daneil Dolin, MD;  Location: [facility] SU[facility];  Service: Endoscopy;  Laterality: N/A;    Family History  Problem Relation Age of Onset  . Anesthesia problems Neg Hx   . Hypotension Neg Hx   . Malignant hyperthermia Neg Hx   . Pseudochol deficiency Neg Hx     History  Substance Use Topics  . Smoking status: Never Smoker   . Smokeless tobacco: Never Used  . Alcohol Use: No    OB History    Grav Para Term Preterm Abortions TAB SAB Ect Mult Living   1 1 1       1       Review of Systems  Constitutional: Negative for fever.  HENT: Negative  for neck pain.   Eyes: Negative for visual disturbance.  Respiratory: Negative for shortness of breath.   Cardiovascular: Negative for chest pain.  Gastrointestinal: Negative for nausea, vomiting and abdominal pain.  Genitourinary: Negative for dysuria.  Musculoskeletal: Negative for back pain.  Skin: Negative for rash.  Neurological: Negative for light-headedness.  Psychiatric/Behavioral: Negative for confusion.    Allergies  Penicillins and Latex  Home Medications   Current Outpatient Rx  Name Route Sig Dispense Refill  . ALUM & MAG HYDROXIDE-SIMETH 500-450-40 MG/5ML PO SUSP Oral Take 30 mLs by mouth every 6 (six) hours as needed. Indigestion    . NEPHRO-VITE 0.8 MG PO TABS Oral Take 0.8 mg by mouth at bedtime.      Marland Kitchen CINACALCET HCL 30 MG PO TABS Oral Take 30 mg by mouth daily with supper.    . FENTANYL 25 MCG/HR TD PT72 Transdermal Place 1 patch onto the skin every other day. Rotate sites    . HYPROMELLOSE 2.5 % OP SOLN Both Eyes Place 1 drop into both eyes 2 (two) times daily.      Marland Kitchen LAMIVUDINE 100 MG PO TABS Oral Take 0.5 tablets (50 mg total) by mouth daily. Take one half tablet daily 150 tablet 90  . LANTHANUM CARBONATE 500 MG PO CHEW Oral Chew 500-1,500 mg by mouth 4 (four) times daily -  with meals and at bedtime. 1500 mg with meals and 500 mg at 8pm    . LIDOCAINE 5 % EX PTCH Transdermal Place 1 patch onto the skin daily. Remove & Discard patch within 12 hours or as directed by MD     . LIDOCAINE-PRILOCAINE 2.5-2.5 % EX CREA Topical Apply 1 application topically as needed. Apply 1 hour prior to dialysis access site    . LORAZEPAM 0.5 MG PO TABS Oral Take 0.125 mg by mouth every 6 (six) hours as needed. Take 1/4 of a tablet For increased anxiety    . BIOFREEZE EX Apply externally Apply 1 application topically 4 (four) times daily as needed. For pain **do not apply near dialysis site**    . ONDANSETRON HCL 4 MG PO TABS Oral Take 2 mg by mouth every 4 (four) hours as needed.  nausea    . OXYCODONE HCL 5 MG PO TABS Oral Take 5 mg by mouth at bedtime.    . OXYCODONE HCL 5 MG PO TABS Oral Take 5 mg by mouth every 6 (six) hours as needed. For pain    . PANTOPRAZOLE SODIUM 40 MG PO TBEC Oral Take 40 mg by mouth 2 (two) times daily.    Marland Kitchen RALTEGRAVIR POTASSIUM 400 MG PO TABS Oral Take 400 mg by mouth 2 (two) times daily.      Marland Kitchen SILDENAFIL CITRATE 20 MG PO TABS Oral Take 20 mg by mouth 3 (three) times daily.      Marland Kitchen VITAMIN C 500 MG PO TABS Oral Take 500 mg by mouth 2 (two) times daily.    . WHEY PROTEIN ISOLATE POWD Oral Take 1 Package by mouth 2 (two) times daily. Mix With vanilla pudding    . ZIDOVUDINE 100 MG PO CAPS Oral Take 100 mg by mouth 3 (three) times daily.      . IPRATROPIUM-ALBUTEROL 0.5-2.5 (3) MG/3ML IN SOLN Nebulization Take 3 mLs by nebulization every 6 (six) hours as needed. For shortness of breath/ wheezing      BP 160/103  Pulse 81  Temp 98 F (36.7 C) (Oral)  Resp 20  Ht 5\' 2"  (1.575 m)  SpO2 97%  Physical Exam  Nursing note and vitals reviewed. Constitutional: She is oriented to person, place, and time. She appears well-developed and well-nourished.  HENT:  Head: Normocephalic.  Eyes: EOM are normal.  Neck: Normal range of motion. No tracheal deviation present.  Cardiovascular: Normal rate, regular rhythm and normal heart sounds.   No murmur heard. Pulmonary/Chest: Effort normal and breath sounds normal. She has no wheezes.  Abdominal: Soft. Bowel sounds are normal. There is no tenderness.  Musculoskeletal: Normal range of motion. She exhibits no edema.       AV shunt in right arm.  Neurological: She is alert and oriented to person, place, and time. No cranial nerve deficit. Coordination normal.  Skin: Skin is warm.  Psychiatric: She has a normal mood and affect.    ED Course  Procedures (including critical care time) DIAGNOSTIC STUDIES: Oxygen Saturation is 97% on room air, adequate by my interpretation.    COORDINATION OF  CARE: 11:00- Patient informed of clinical course, understands medical decision-making process, and agrees with plan.  Waiting on head CT and left hip XR.      Labs Reviewed  CBC WITH DIFFERENTIAL - Abnormal; Notable for the following:    WBC 2.7 (*)     RBC 3.06 (*)     Hemoglobin 9.7 (*)  HCT 30.9 (*)     MCV 101.0 (*)     RDW 16.1 (*)     Platelets 69 (*)     Lymphs Abs 0.6 (*)     All other components within normal limits  COMPREHENSIVE METABOLIC PANEL - Abnormal; Notable for the following:    Glucose, Bld 102 (*)     BUN 30 (*)     Creatinine, Ser 9.87 (*)     Total Protein 8.9 (*)     Albumin 3.4 (*)     GFR calc non Af Amer 4 (*)     GFR calc Af Amer 4 (*)     All other components within normal limits  APTT - Abnormal; Notable for the following:    aPTT 38 (*)     All other components within normal limits  PROTIME-INR   Dg Hip Complete Left  12/28/2011  *RADIOLOGY REPORT*  Clinical Data: Fall, left hip pain.  LEFT HIP - COMPLETE 2+ VIEW  Comparison: None.  Findings: Early degenerative changes in the hips bilaterally.  SI joints are symmetric and unremarkable. No acute bony abnormality. Specifically, no fracture, subluxation, or dislocation.  Soft tissues are intact.  IMPRESSION: No acute bony abnormality.   Original Report Authenticated By: Raelyn Number, M.D.    Ct Head Wo Contrast  12/28/2011  *RADIOLOGY REPORT*  Clinical Data: Fall with left sided head trauma.  Severe left sided headache.  CT HEAD WITHOUT CONTRAST  Technique:  Contiguous axial images were obtained from the base of the skull through the vertex without contrast.  Comparison: 01/29/2009.  Findings: High attenuation material is seen along the inner table of the high left parietal bone, deep to an associated scalp hematoma.  No significant mass effect.  No evidence of acute infarct, mass lesion or hydrocephalus.  There is periventricular low attenuation.  No associated fracture.  No air fluid levels in the  visualized portions of the paranasal sinuses.  IMPRESSION:  1.  Small extra-axial hematoma along the high left parietal lobe, with overlying scalp hematoma.  No associated fracture. Critical Value/emergent results were called by telephone at the time of interpretation on 12/28/2011 at 1138 hours to Dr. Rogene Houston, who verbally acknowledged these results. 2.  Chronic microvascular white matter ischemic changes.   Original Report Authenticated By: Luretha Rued, M.D.    Dg Chest Port 1 View  12/28/2011  *RADIOLOGY REPORT*  Clinical Data: Fall, hypertension.  PORTABLE CHEST - 1 VIEW  Comparison: 09/20/2011.  Chest CT 01/22/2009.  Findings: Bilateral perihilar fullness again noted. When compared to prior CT from 2010, this is likely stable and related to prominent pulmonary arteries, likely pulmonary arterial hypertension.  No change since prior study.  Mild diffuse interstitial prominence and cardiomegaly.  No effusions.  No acute bony abnormality.  IMPRESSION: Stable bilateral perihilar prominence.  Interstitial prominence could reflect interstitial edema.   Original Report Authenticated By: Raelyn Number, M.D.     Date: 12/28/2011  Rate: 84  Rhythm: normal sinus rhythm and premature atrial contractions (PAC)  QRS Axis: left  Intervals: normal  ST/T Wave abnormalities: nonspecific ST changes  Conduction Disutrbances:none  Narrative Interpretation:   Old EKG Reviewed: unchanged From 09/20/2011. Also is prolonged QT.  Results for orders placed during the hospital encounter of 12/28/11  CBC WITH DIFFERENTIAL      Component Value Range   WBC 2.7 (*) 4.0 - 10.5 K/uL   RBC 3.06 (*) 3.87 - 5.11 MIL/uL   Hemoglobin 9.7 (*)  12.0 - 15.0 g/dL   HCT 30.9 (*) 36.0 - 46.0 %   MCV 101.0 (*) 78.0 - 100.0 fL   MCH 31.7  26.0 - 34.0 pg   MCHC 31.4  30.0 - 36.0 g/dL   RDW 16.1 (*) 11.5 - 15.5 %   Platelets 69 (*) 150 - 400 K/uL   Neutrophils Relative 71  43 - 77 %   Neutro Abs 1.9  1.7 - 7.7 K/uL    Lymphocytes Relative 20  12 - 46 %   Lymphs Abs 0.6 (*) 0.7 - 4.0 K/uL   Monocytes Relative 7  3 - 12 %   Monocytes Absolute 0.2  0.1 - 1.0 K/uL   Eosinophils Relative 1  0 - 5 %   Eosinophils Absolute 0.0  0.0 - 0.7 K/uL   Basophils Relative 1  0 - 1 %   Basophils Absolute 0.0  0.0 - 0.1 K/uL  COMPREHENSIVE METABOLIC PANEL      Component Value Range   Sodium 137  135 - 145 mEq/L   Potassium 3.7  3.5 - 5.1 mEq/L   Chloride 100  96 - 112 mEq/L   CO2 26  19 - 32 mEq/L   Glucose, Bld 102 (*) 70 - 99 mg/dL   BUN 30 (*) 6 - 23 mg/dL   Creatinine, Ser 9.87 (*) 0.50 - 1.10 mg/dL   Calcium 8.6  8.4 - 10.5 mg/dL   Total Protein 8.9 (*) 6.0 - 8.3 g/dL   Albumin 3.4 (*) 3.5 - 5.2 g/dL   AST 21  0 - 37 U/L   ALT 8  0 - 35 U/L   Alkaline Phosphatase 99  39 - 117 U/L   Total Bilirubin 0.5  0.3 - 1.2 mg/dL   GFR calc non Af Amer 4 (*) >90 mL/min   GFR calc Af Amer 4 (*) >90 mL/min  PROTIME-INR      Component Value Range   Prothrombin Time 14.4  11.6 - 15.2 seconds   INR 1.14  0.00 - 1.49  APTT      Component Value Range   aPTT 38 (*) 24 - 37 seconds       1. Head injury   2. HIV (human immunodeficiency virus infection)   3. End stage renal disease   4. Human immunodeficiency virus (HIV) disease   5. Intracranial bleed     CRITICAL CARE Performed by: Mervin Kung.   Total critical care time: 50  Critical care time was exclusive of separately billable procedures and treating other patients.  Critical care was necessary to treat or prevent imminent or life-threatening deterioration.  Critical care was time spent personally by me on the following activities: development of treatment plan with patient and/or surrogate as well as nursing, discussions with consultants, evaluation of patient's response to treatment, examination of patient, obtaining history from patient or surrogate, ordering and performing treatments and interventions, ordering and review of laboratory studies,  ordering and review of radiographic studies, pulse oximetry and re-evaluation of patient's condition.   MDM   Patient with fall [place].she struck the left side of her head she has a scalp hematoma there. Also complained of left hip pain which is negative. No neuro deficits. Patient's alert and oriented. Patient is a dialysis patient. Head CT showed a small extra-axial hematoma along the left parietal lobe below the scalp hematoma. No fractures. This finding was discussed with Dr. Saintclair Halsted from neurosurgery. He is recommending repeat head CT  at 7 PM this evening if the hematoma is expanding he needs to be contacted if remaining the same she can be dialyzed tomorrow without heparin. He felt that the patient could stay here I discussions with our hospitalist here at [facility] they felt more carpal with the patient being transferred to the hospitalist service at [facility] hospital. They were contacted they accepted the patient [facility] will transfer. Patient will go to a step down bed to [facility] [place] team 4.     Rest of the patient's labs without significant abnormalities based on her electrolytes she may not require dialysis tomorrow she normally dialyzed on Tuesday Thursdays and Saturdays. Patient mentally has remained stable she is aware of her injuries there is no scalp laceration there is no focal deficit at this time. She does have the complaint of a headache that was improved with pain medicine.  Blood pressure was initially elevated at 160/103 after pain medication as improved systolic still remains a little high diastolic is better. Patient has a known history of hypertension. Patient also has a history of HIV and is on antivirals.   I personally performed the services described in this documentation, which was scribed in my presence. The recorded information has been reviewed and considered.         Mervin Kung, MD 12/28/11 [phone]

## 2011-12-28 NOTE — ED Notes (Signed)
Resident at [facility] - per EMS - pt fell in floor of nursing home trying to get away from spider.  C/o pain to left hip and left side of head.  Unsure of LOC.  Denies hitting any objects during fall.  Pt alert and oriented x 4 at this time.

## 2011-12-28 NOTE — ED Notes (Signed)
Patient does not need anything at this time. 

## 2011-12-28 NOTE — Progress Notes (Signed)
[facility] PROGRESS NOTE  [name] [id number] DOB: June 20, 1950 DOA: 12/28/2011 PCP: Cyndee Brightly, MD  Assessment/Plan: Principal Problem:  *Intracranial bleed Active Problems:  HUMAN IMMUNODEFICIENCY VIRUS [HIV]  End stage renal disease   1. Fall/Intracranial bleed: Patient sustained a mechanical fall, complicated by a small extra-axial hematoma along the high left parietal lobe, with overlying scalp hematoma. No associated fracture. Fortunately, she has no other injuries. Patient will undergo neuro-checks and serial head CT scans. Analgesics will be utilized for comfort. Dr Kary Kos, neurosurgeon, has been consulted. 2. End-stage renal disease on hemodialysis. Patient is on HD T-T-S. We have consulted Dr Maye Hides, nephrologist, to reinstate dialysis schedule.  3. HIV disease: Patient is stable on antiretroviral therapy, which we shall continue.  4. HTN: Uncontrolled at this time. We shall place patient on Norvasc for now, and utilize prn Hydralazine.   Code Status: Full Code. Family Communication:  Disposition Plan: To be determined.    Brief narrative: 61 y.o. female transferred from [place] on 12/28/11, by Dr Hurshel Party. NH resident, with known history of HIV disease on HAART, HTN, depression, ESRD on HD T-T-S, chronic diarrhea, previous C. Difficile colitis, admitted to [facility] following a fall in the nursing home today. She was trying to escape from a spider on the floor. She hit the left side of her head. She did not lose consciousness. On evaluation in the ED, she was found to have a left scalp hematoma, and imaging studies confirmed a small extra-axial hematoma along the high left parietal lobe. Neurologically, she has remained stable, but was transferred in case neurosurgical intervention becomes necessary.    Consultants:  Dr Kary Kos, Neurosurgery.   Dr Edrick Oh, nephrologist.   Procedures:  Head CT scan.  Cervical spine CT.    CXR.  Hip X-ray.   Antibiotics:  N/A.   HPI/Subjective: No new issues. Not in acute pain.   Objective: Vital signs in last 24 hours: Temp:  [98 F (36.7 C)] 98 F (36.7 C) (10/28 1032) Pulse Rate:  [72-82] 82  (10/28 1501) Resp:  [17-27] 18  (10/28 1501) BP: (160-182)/(92-118) 182/105 mmHg (10/28 1501) SpO2:  [93 %-98 %] 98 % (10/28 1501) Weight change:     Intake/Output from previous day:       Physical Exam: General: Comfortable, alert, communicative, fully oriented, not short of breath at rest.  HEENT:  Mild clinical pallor, no jaundice, no conjunctival injection or discharge. Has small hematoma, left side of head. Hydration status is fair.  NECK:  Supple, JVP not seen, no carotid bruits, no palpable lymphadenopathy, no palpable goiter. CHEST:  Clinically clear to auscultation, no wheezes, no crackles. HEART:  Sounds 1 and 2 heard, normal, irregular, no murmurs. ABDOMEN:  Full, soft, non-tender, no palpable organomegaly, no palpable masses, normal bowel sounds. GENITALIA:  Not examined. UPPER EXTREMITIES: Patient has AV fistula right arm.  LOWER EXTREMITIES:  No pitting edema, palpable peripheral pulses. MUSCULOSKELETAL SYSTEM:  Generalized osteoarthritic changes, otherwise, normal. CENTRAL NERVOUS SYSTEM:  No focal neurologic deficit on gross examination.  Lab Results:  Basename 12/28/11 1147  WBC 2.7*  HGB 9.7*  HCT 30.9*  PLT 69*    Basename 12/28/11 1147  NA 137  K 3.7  CL 100  CO2 26  GLUCOSE 102*  BUN 30*  CREATININE 9.87*  CALCIUM 8.6   No results found for this or any previous visit (from the past 240 hour(s)).   Studies/Results: Dg Hip Complete Left  12/28/2011  *RADIOLOGY REPORT*  Clinical Data: Fall, left hip pain.  LEFT HIP - COMPLETE 2+ VIEW  Comparison: None.  Findings: Early degenerative changes in the hips bilaterally.  SI joints are symmetric and unremarkable. No acute bony abnormality. Specifically, no fracture, subluxation, or  dislocation.  Soft tissues are intact.  IMPRESSION: No acute bony abnormality.   Original Report Authenticated By: Raelyn Number, M.D.    Ct Head Wo Contrast  12/28/2011  *RADIOLOGY REPORT*  Clinical Data: Fall with left sided head trauma.  Severe left sided headache.  CT HEAD WITHOUT CONTRAST  Technique:  Contiguous axial images were obtained from the base of the skull through the vertex without contrast.  Comparison: 01/29/2009.  Findings: High attenuation material is seen along the inner table of the high left parietal bone, deep to an associated scalp hematoma.  No significant mass effect.  No evidence of acute infarct, mass lesion or hydrocephalus.  There is periventricular low attenuation.  No associated fracture.  No air fluid levels in the visualized portions of the paranasal sinuses.  IMPRESSION:  1.  Small extra-axial hematoma along the high left parietal lobe, with overlying scalp hematoma.  No associated fracture. Critical Value/emergent results were called by telephone at the time of interpretation on 12/28/2011 at 1138 hours to Dr. Rogene Houston, who verbally acknowledged these results. 2.  Chronic microvascular white matter ischemic changes.   Original Report Authenticated By: Luretha Rued, M.D.    Ct Cervical Spine Wo Contrast  12/28/2011  *RADIOLOGY REPORT*  Clinical Data: [place] today, severe headaches.  CT CERVICAL SPINE WITHOUT CONTRAST  Technique:  Multidetector CT imaging of the cervical spine was performed. Multiplanar CT image reconstructions were also generated.  Comparison: CT neck from 08/24/2002.  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  There is severe facet arthropathy at C3-4 and C4-5 on the left. Moderate facet arthropathy is noted at C2-3 on the left.  There is 2 mm of facet mediated slip C4-C5.  No traumatic subluxation is seen.  There is no cervical spine fracture.  No prevertebral soft tissue swelling.  Lung apices  are clear.  Trachea midline.  Fairly well circumscribed lesions are noted in the right division of C2 and T1. These measure roughly 7 mm in cross section C2 and 5 mm cross-section at T1.  These are likely related to other smaller lesions noted posterior elements consistent with benign bone cysts. A similar lesion can be seen in T1 from 2004.  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  Advanced left-sided facet arthropathy.  No cervical spine fracture or traumatic subluxation.  Multiple bones cysts as described.   Original Report Authenticated By: Staci Righter, M.D.    Dg Chest Port 1 View  12/28/2011  *RADIOLOGY REPORT*  Clinical Data: Fall, hypertension.  PORTABLE CHEST - 1 VIEW  Comparison: 09/20/2011.  Chest CT 01/22/2009.  Findings: Bilateral perihilar fullness again noted. When compared to prior CT from 2010, this is likely stable and related to prominent pulmonary arteries, likely pulmonary arterial hypertension.  No change since prior study.  Mild diffuse interstitial prominence and cardiomegaly.  No effusions.  No acute bony abnormality.  IMPRESSION: Stable bilateral perihilar prominence.  Interstitial prominence could reflect interstitial edema.   Original Report Authenticated By: Raelyn Number, M.D.     Medications: Scheduled Meds:   . cinacalcet  30 mg Oral Q supper  . feeding supplement  30 mL Oral BID WC  .  fentaNYL  25 mcg Transdermal Q48H  . HYDROmorphone  1 mg Intravenous Once  . hydroxypropyl methylcellulose  1 drop Both Eyes BID  . lamivudine  50 mg Oral Daily  . lanthanum  1,500 mg Oral TID WC  . lanthanum  500 mg Oral Q2000  . lidocaine  1 patch Transdermal Q24H  . metoprolol tartrate  50 mg Oral Once  . multivitamin  1 tablet Oral Daily  . ondansetron  4 mg Intravenous Once  . pantoprazole  40 mg Oral BID  . raltegravir  400 mg Oral BID  . sildenafil  20 mg Oral TID  . sodium chloride  3 mL Intravenous Q12H  . zidovudine  100 mg Oral TID    . DISCONTD: vitamin C  500 mg Oral BID  . DISCONTD: Whey Protein Isolate  1 Package Oral BID   Continuous Infusions:   . sodium chloride 50 mL/hr at 12/28/11 1201   PRN Meds:.albuterol, alum & mag hydroxide-simeth, ipratropium, LORazepam, ondansetron, oxyCODONE, DISCONTD: ipratropium-albuterol    LOS: 0 days   [place] [facility] Pager [phone]. If 8PM-8AM, please contact night-coverage at [email or web address], password [facility] 12/28/2011, 5:28 PM  LOS: 0 days

## 2011-12-28 NOTE — ED Notes (Signed)
RN at bedside

## 2011-12-28 NOTE — H&P (Signed)
[facility] History and Physical  [name] [id number] DOB: Jul 14, 1950 DOA: 12/28/2011  Referring physician: Dr. Bobby Rumpf, [place] physician. PCP: Cyndee Brightly, MD    Chief Complaint: Fall with intracranial bleeding.  HPI: [name] is a 61 y.o. female presents with the above symptoms, having had a fall in the nursing home today. She was trying to escape from a spider on the floor. She somehow managed to fall and she was time to keep away from the floor and she seems to have tripped over a chair nearby. She hit the left side of her head. She did not lose consciousness. When she presented to the emergency room, she was found to have a small extra-axial hematoma along the high  left parietal lobe, with overlying scalp hematoma. Her neurological status has not deteriorated. This lady has end-stage renal disease and receives hemodialysis 3 times a week. She also has HIV disease and is on antiretroviral therapy.   Review of Systems: Apart from history of present illness, other systems negative.  Past Medical History  Diagnosis Date  . Pulmonary HTN   . Hypertension   . Depression   . Insomnia   . Chronic diarrhea   . HIV (human immunodeficiency virus infection)   . ESRD on hemodialysis   . C. difficile colitis 10/10/11   Past Surgical History  Procedure Date  . Dialysis shunts   . Esophagogastroduodenoscopy 12/15/2010    Procedure: ESOPHAGOGASTRODUODENOSCOPY (EGD);  Surgeon: Daneil Dolin, MD;  Location: [facility];  Service: Endoscopy;  Laterality: N/A;   Social History:  She lives at [facility], nursing home. She has been living there for the last 6 years. He does not smoke. She does not drink alcohol. She is receiving hemodialysis 3 times a week.   Allergies  Allergen Reactions  . Penicillins     REACTION: rash  . Latex Rash    Family History  Problem Relation Age of Onset  . Anesthesia problems Neg Hx   . Hypotension Neg Hx   . Malignant  hyperthermia Neg Hx   . Pseudochol deficiency Neg Hx       Prior to Admission medications   Medication Sig Start Date End Date Taking? Authorizing Provider  aluminum & magnesium hydroxide-simethicone (MYLANTA) 500-450-40 MG/5ML suspension Take 30 mLs by mouth every 6 (six) hours as needed. Indigestion   Yes Historical Provider, MD  b complex-vitamin c-folic acid (NEPHRO-VITE) 0.8 MG TABS Take 0.8 mg by mouth at bedtime.     Yes Historical Provider, MD  cinacalcet (SENSIPAR) 30 MG tablet Take 30 mg by mouth daily with supper.   Yes Historical Provider, MD  fentaNYL (DURAGESIC - DOSED MCG/HR) 25 MCG/HR Place 1 patch onto the skin every other day. Rotate sites   Yes Historical Provider, MD  hydroxypropyl methylcellulose (ISOPTO TEARS) 2.5 % ophthalmic solution Place 1 drop into both eyes 2 (two) times daily.     Yes Historical Provider, MD  lamivudine (EPIVIR) 100 MG tablet Take 0.5 tablets (50 mg total) by mouth daily. Take one half tablet daily 06/11/10  Yes Campbell Riches, MD  lanthanum (FOSRENOL) 500 MG chewable tablet Chew 500-1,500 mg by mouth 4 (four) times daily -  with meals and at bedtime. 1500 mg with meals and 500 mg at 8pm   Yes Historical Provider, MD  lidocaine (LIDODERM) 5 % Place 1 patch onto the skin daily. Remove & Discard patch within 12 hours or as directed by MD    Yes Historical  Provider, MD  lidocaine-prilocaine (EMLA) cream Apply 1 application topically as needed. Apply 1 hour prior to dialysis access site   Yes Historical Provider, MD  LORazepam (ATIVAN) 0.5 MG tablet Take 0.125 mg by mouth every 6 (six) hours as needed. Take 1/4 of a tablet For increased anxiety   Yes Historical Provider, MD  Menthol, Topical Analgesic, (BIOFREEZE EX) Apply 1 application topically 4 (four) times daily as needed. For pain **do not apply near dialysis site**   Yes Historical Provider, MD  ondansetron (ZOFRAN) 4 MG tablet Take 2 mg by mouth every 4 (four) hours as needed. nausea   Yes  Historical Provider, MD  oxyCODONE (OXY IR/ROXICODONE) 5 MG immediate release tablet Take 5 mg by mouth at bedtime.   Yes Historical Provider, MD  oxyCODONE (OXY IR/ROXICODONE) 5 MG immediate release tablet Take 5 mg by mouth every 6 (six) hours as needed. For pain   Yes Historical Provider, MD  pantoprazole (PROTONIX) 40 MG tablet Take 40 mg by mouth 2 (two) times daily.   Yes Historical Provider, MD  raltegravir (ISENTRESS) 400 MG tablet Take 400 mg by mouth 2 (two) times daily.     Yes Historical Provider, MD  sildenafil (REVATIO) 20 MG tablet Take 20 mg by mouth 3 (three) times daily.     Yes Historical Provider, MD  vitamin C (ASCORBIC ACID) 500 MG tablet Take 500 mg by mouth 2 (two) times daily.   Yes Historical Provider, MD  Whey Protein Isolate POWD Take 1 Package by mouth 2 (two) times daily. Mix With vanilla pudding   Yes Historical Provider, MD  zidovudine (RETROVIR) 100 MG capsule Take 100 mg by mouth 3 (three) times daily.     Yes Historical Provider, MD  ipratropium-albuterol (DUONEB) 0.5-2.5 (3) MG/3ML SOLN Take 3 mLs by nebulization every 6 (six) hours as needed. For shortness of breath/ wheezing    Historical Provider, MD   Physical Exam: Filed Vitals:   12/28/11 1219 12/28/11 1242 12/28/11 1300 12/28/11 1400  BP: 176/103 175/93 170/92 169/118  Pulse:   77 72  Temp:      TempSrc:      Resp:   27 17  Height:      SpO2:  94% 94% 93%     General:  She looks chronically sick. However she is alert and orientated.  Eyes: No jaundice. No pallor.  ENT: No abnormalities.  Neck: No lymphadenopathy.  Cardiovascular: Heart sounds are present and appear to be in sinus rhythm. There is no evidence of heart failure.  Respiratory: Lung fields are clear.  Abdomen: Soft, nontender, no hepatosplenomegaly.  Skin: No rashes.  Musculoskeletal: No major abnormalities.  Psychiatric: Appropriate affect.  Neurologic: No focal neurological signs.  Labs on Admission:  Basic  Metabolic Panel:  Lab 99991111 1147  NA 137  K 3.7  CL 100  CO2 26  GLUCOSE 102*  BUN 30*  CREATININE 9.87*  CALCIUM 8.6  MG --  PHOS --   Liver Function Tests:  Lab 12/28/11 1147  AST 21  ALT 8  ALKPHOS 99  BILITOT 0.5  PROT 8.9*  ALBUMIN 3.4*     CBC:  Lab 12/28/11 1147  WBC 2.7*  NEUTROABS 1.9  HGB 9.7*  HCT 30.9*  MCV 101.0*  PLT 69*      BNP (last 3 results)  Basename 03/09/11 1503  PROBNP 3673.0*      Radiological Exams on Admission: Dg Hip Complete Left  12/28/2011  *RADIOLOGY REPORT*  Clinical  Data: Fall, left hip pain.  LEFT HIP - COMPLETE 2+ VIEW  Comparison: None.  Findings: Early degenerative changes in the hips bilaterally.  SI joints are symmetric and unremarkable. No acute bony abnormality. Specifically, no fracture, subluxation, or dislocation.  Soft tissues are intact.  IMPRESSION: No acute bony abnormality.   Original Report Authenticated By: Raelyn Number, M.D.    Ct Head Wo Contrast  12/28/2011  *RADIOLOGY REPORT*  Clinical Data: Fall with left sided head trauma.  Severe left sided headache.  CT HEAD WITHOUT CONTRAST  Technique:  Contiguous axial images were obtained from the base of the skull through the vertex without contrast.  Comparison: 01/29/2009.  Findings: High attenuation material is seen along the inner table of the high left parietal bone, deep to an associated scalp hematoma.  No significant mass effect.  No evidence of acute infarct, mass lesion or hydrocephalus.  There is periventricular low attenuation.  No associated fracture.  No air fluid levels in the visualized portions of the paranasal sinuses.  IMPRESSION:  1.  Small extra-axial hematoma along the high left parietal lobe, with overlying scalp hematoma.  No associated fracture. Critical Value/emergent results were called by telephone at the time of interpretation on 12/28/2011 at 1138 hours to Dr. Rogene Houston, who verbally acknowledged these results. 2.  Chronic  microvascular white matter ischemic changes.   Original Report Authenticated By: Luretha Rued, M.D.    Dg Chest Port 1 View  12/28/2011  *RADIOLOGY REPORT*  Clinical Data: Fall, hypertension.  PORTABLE CHEST - 1 VIEW  Comparison: 09/20/2011.  Chest CT 01/22/2009.  Findings: Bilateral perihilar fullness again noted. When compared to prior CT from 2010, this is likely stable and related to prominent pulmonary arteries, likely pulmonary arterial hypertension.  No change since prior study.  Mild diffuse interstitial prominence and cardiomegaly.  No effusions.  No acute bony abnormality.  IMPRESSION: Stable bilateral perihilar prominence.  Interstitial prominence could reflect interstitial edema.   Original Report Authenticated By: Raelyn Number, M.D.       Assessment/Plan   1. Left-sided intracranial bleed, small. No change in mental status. 2. End-stage renal disease on hemodialysis. She is due to have hemodialysis tomorrow. 3. HIV disease, stable on antiretroviral therapy. Plan: 1. Admit to a telemetry bed for close monitoring. 2. Repeat CT scan at 7 PM today per neurosurgical recommendations. Consult neurosurgery. 3. Patient was seen at [facility]. She is then to be transferred to [facility] because of the presence of neurosurgical services. Currently she is clinically stable.  Code Status: Full code.  Family Communication: Discussed plan with patient at the bedside.   Disposition Plan: Back to the skilled nursing facility when medically stable.   Time spent: 30 minutes.  Doree Albee [facility] Pager [phone].  If 7PM-7AM, please contact night-coverage [email or web address] Password [facility] 12/28/2011, 2:34 PM

## 2011-12-28 NOTE — ED Notes (Signed)
Pt requesting pain meds.  edp notified, no orders received at this time.

## 2011-12-28 NOTE — Consult Note (Signed)
Reason for Consult: Closed head injury left parietal epidural hematoma Referring Physician: [facility] [name] is an 61 y.o. female.  HPI: Patient is a 61 year old female with basilar history significant for end-stage renal disease hemodialysis HIV hypertension pulmonary hypertension who had a fall earlier today when she was at the nursing home she saw spider was try to get out of the way she fell landing on the left side of her head. She denies any loss of consciousness she has a mild headache right now she denies any nausea or vomiting denies any numbness feeling in her arms or legs. This was initially seen any pen and CT scan showed the hematoma patient transferred the hospitalist service here comfort observation.  Past Medical History  Diagnosis Date  . Pulmonary HTN   . Hypertension   . Depression   . Insomnia   . Chronic diarrhea   . HIV (human immunodeficiency virus infection)   . ESRD on hemodialysis   . C. difficile colitis 10/10/11    Past Surgical History  Procedure Date  . Dialysis shunts   . Esophagogastroduodenoscopy 12/15/2010    Procedure: ESOPHAGOGASTRODUODENOSCOPY (EGD);  Surgeon: Daneil Dolin, MD;  [facility];  Service: Endoscopy;  Laterality: N/A;    Family History  Problem Relation Age of Onset  . Anesthesia problems Neg Hx   . Hypotension Neg Hx   . Malignant hyperthermia Neg Hx   . Pseudochol deficiency Neg Hx     Social History:  reports that she has never smoked. She has never used smokeless tobacco. She reports that she does not drink alcohol or use illicit drugs.  Allergies:  Allergies  Allergen Reactions  . Penicillins     REACTION: rash  . Latex Rash    Medications: I have reviewed the patient's current medications.  Results for orders placed during the hospital encounter of 12/28/11 (from the past 48 hour(s))  CBC WITH DIFFERENTIAL     Status: Abnormal   Collection Time   12/28/11 11:47 AM   Component Value Range Comment   WBC 2.7 (*) 4.0 - 10.5 K/uL    RBC 3.06 (*) 3.87 - 5.11 MIL/uL    Hemoglobin 9.7 (*) 12.0 - 15.0 g/dL    HCT 30.9 (*) 36.0 - 46.0 %    MCV 101.0 (*) 78.0 - 100.0 fL    MCH 31.7  26.0 - 34.0 pg    MCHC 31.4  30.0 - 36.0 g/dL    RDW 16.1 (*) 11.5 - 15.5 %    Platelets 69 (*) 150 - 400 K/uL    Neutrophils Relative 71  43 - 77 %    Neutro Abs 1.9  1.7 - 7.7 K/uL    Lymphocytes Relative 20  12 - 46 %    Lymphs Abs 0.6 (*) 0.7 - 4.0 K/uL    Monocytes Relative 7  3 - 12 %    Monocytes Absolute 0.2  0.1 - 1.0 K/uL    Eosinophils Relative 1  0 - 5 %    Eosinophils Absolute 0.0  0.0 - 0.7 K/uL    Basophils Relative 1  0 - 1 %    Basophils Absolute 0.0  0.0 - 0.1 K/uL   COMPREHENSIVE METABOLIC PANEL     Status: Abnormal   Collection Time   12/28/11 11:47 AM      Component Value Range Comment   Sodium 137  135 - 145 mEq/L    Potassium 3.7  3.5 - 5.1 mEq/L    Chloride 100  96 - 112 mEq/L    CO2 26  19 - 32 mEq/L    Glucose, Bld 102 (*) 70 - 99 mg/dL    BUN 30 (*) 6 - 23 mg/dL    Creatinine, Ser 9.87 (*) 0.50 - 1.10 mg/dL    Calcium 8.6  8.4 - 10.5 mg/dL    Total Protein 8.9 (*) 6.0 - 8.3 g/dL    Albumin 3.4 (*) 3.5 - 5.2 g/dL    AST 21  0 - 37 U/L    ALT 8  0 - 35 U/L    Alkaline Phosphatase 99  39 - 117 U/L    Total Bilirubin 0.5  0.3 - 1.2 mg/dL    GFR calc non Af Amer 4 (*) >90 mL/min    GFR calc Af Amer 4 (*) >90 mL/min   PROTIME-INR     Status: Normal   Collection Time   12/28/11  2:03 PM      Component Value Range Comment   Prothrombin Time 14.4  11.6 - 15.2 seconds    INR 1.14  0.00 - 1.49   APTT     Status: Abnormal   Collection Time   12/28/11  2:03 PM      Component Value Range Comment   aPTT 38 (*) 24 - 37 seconds   MRSA PCR SCREENING     Status: Normal   Collection Time   12/28/11  5:16 PM      Component Value Range Comment   MRSA by PCR NEGATIVE  NEGATIVE     Dg Hip Complete Left  12/28/2011  *RADIOLOGY REPORT*  Clinical  Data: Fall, left hip pain.  LEFT HIP - COMPLETE 2+ VIEW  Comparison: None.  Findings: Early degenerative changes in the hips bilaterally.  SI joints are symmetric and unremarkable. No acute bony abnormality. Specifically, no fracture, subluxation, or dislocation.  Soft tissues are intact.  IMPRESSION: No acute bony abnormality.   Original Report Authenticated By: Raelyn Number, M.D.    Ct Head Wo Contrast  12/28/2011  *RADIOLOGY REPORT*  Clinical Data: Follow-up intracranial hemorrhage  CT HEAD WITHOUT CONTRAST  Technique:  Contiguous axial images were obtained from the base of the skull through the vertex without contrast.  Comparison: CT scan earlier in the day.  Findings: Persistent focal left parietal 10 mm thick collection, biconvex in shape, likely epidural hematoma is redemonstrated and unchanged from priors.  There is no skull fracture.  Left sided scalp hematoma remains.  Mild atrophy is present.  There is chronic microvascular ischemic change.  Cavum septum vergae.  No subarachnoid blood.  No midline shift.  Clear sinuses and mastoids.  IMPRESSION: Stable left parietal extra-axial collection, likely epidural hematoma.  No significant increase in size.  Stable scalp hematoma.   Original Report Authenticated By: Staci Righter, M.D.    Ct Head Wo Contrast  12/28/2011  *RADIOLOGY REPORT*  Clinical Data: Fall with left sided head trauma.  Severe left sided headache.  CT HEAD WITHOUT CONTRAST  Technique:  Contiguous axial images were obtained from the base of the skull through the vertex without contrast.  Comparison: 01/29/2009.  Findings: High attenuation material is seen along the inner table of the high left parietal bone, deep to an associated scalp hematoma.  No significant mass effect.  No evidence of acute infarct, mass lesion or hydrocephalus.  There is periventricular low attenuation.  No associated fracture.  No air fluid  levels in the visualized portions of the paranasal sinuses.   IMPRESSION:  1.  Small extra-axial hematoma along the high left parietal lobe, with overlying scalp hematoma.  No associated fracture. Critical Value/emergent results were called by telephone at the time of interpretation on 12/28/2011 at 1138 hours to Dr. Rogene Houston, who verbally acknowledged these results. 2.  Chronic microvascular white matter ischemic changes.   Original Report Authenticated By: Luretha Rued, M.D.    Ct Cervical Spine Wo Contrast  12/28/2011  *RADIOLOGY REPORT*  Clinical Data: [place] today, severe headaches.  CT CERVICAL SPINE WITHOUT CONTRAST  Technique:  Multidetector CT imaging of the cervical spine was performed. Multiplanar CT image reconstructions were also generated.  Comparison: CT neck from 08/24/2002.  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  There is severe facet arthropathy at C3-4 and C4-5 on the left. Moderate facet arthropathy is noted at C2-3 on the left.  There is 2 mm of facet mediated slip C4-C5.  No traumatic subluxation is seen.  There is no cervical spine fracture.  No prevertebral soft tissue swelling.  Lung apices are clear.  Trachea midline.  Fairly well circumscribed lesions are noted in the right division of C2 and T1. These measure roughly 7 mm in cross section C2 and 5 mm cross-section at T1.  These are likely related to other smaller lesions noted posterior elements consistent with benign bone cysts. A similar lesion can be seen in T1 from 2004.  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  Advanced left-sided facet arthropathy.  No cervical spine fracture or traumatic subluxation.  Multiple bones cysts as described.   Original Report Authenticated By: Staci Righter, M.D.    Dg Chest Port 1 View  12/28/2011  *RADIOLOGY REPORT*  Clinical Data: Fall, hypertension.  PORTABLE CHEST - 1 VIEW  Comparison: 09/20/2011.  Chest CT 01/22/2009.  Findings: Bilateral perihilar fullness  again noted. When compared to prior CT from 2010, this is likely stable and related to prominent pulmonary arteries, likely pulmonary arterial hypertension.  No change since prior study.  Mild diffuse interstitial prominence and cardiomegaly.  No effusions.  No acute bony abnormality.  IMPRESSION: Stable bilateral perihilar prominence.  Interstitial prominence could reflect interstitial edema.   Original Report Authenticated By: Raelyn Number, M.D.     @ROS @ Blood pressure 177/85, pulse 81, temperature 98.1 F (36.7 C), temperature source Oral, resp. rate 16, height 5\' 1"  (1.549 m), weight 57.1 kg (125 lb 14.1 oz), SpO2 98.00%. Awake alert oriented x4 peoples are equal extraocular movements are intact cranial nerves are intact. Strength is 5 of 5 in her upper and lower extremities with some pain limited weakness in the right arm where her fistula is she feels like she might have  fallen on her right arm.  Assessment/Plan: 61 year old female with close injury and a CT scan consistent with a very small high parietal epidural hematoma with minimal to no mass effect and a cephalo- hematoma this is now been stable on 2 serial CTs space 7 hours apart we will plan on advancing her diet mobilizing her and repeating a CT scan of her head in the morning. If the CT scan is stable and she's cleared from medical perspective she can return to the nursing home.  [name] 12/28/2011, 9:24 PM

## 2011-12-29 ENCOUNTER — Encounter (HOSPITAL_COMMUNITY): Payer: Self-pay | Admitting: Radiology

## 2011-12-29 ENCOUNTER — Inpatient Hospital Stay (HOSPITAL_COMMUNITY): Payer: Medicare Other

## 2011-12-29 DIAGNOSIS — M25539 Pain in unspecified wrist: Secondary | ICD-10-CM | POA: Diagnosis not present

## 2011-12-29 DIAGNOSIS — N186 End stage renal disease: Secondary | ICD-10-CM | POA: Diagnosis not present

## 2011-12-29 DIAGNOSIS — D649 Anemia, unspecified: Secondary | ICD-10-CM

## 2011-12-29 DIAGNOSIS — S0990XA Unspecified injury of head, initial encounter: Secondary | ICD-10-CM | POA: Diagnosis not present

## 2011-12-29 DIAGNOSIS — I6200 Nontraumatic subdural hemorrhage, unspecified: Secondary | ICD-10-CM | POA: Diagnosis not present

## 2011-12-29 DIAGNOSIS — Z21 Asymptomatic human immunodeficiency virus [HIV] infection status: Secondary | ICD-10-CM | POA: Diagnosis not present

## 2011-12-29 LAB — CBC
Hemoglobin: 9 g/dL — ABNORMAL LOW (ref 12.0–15.0)
MCH: 31.1 pg (ref 26.0–34.0)
MCHC: 31.1 g/dL (ref 30.0–36.0)
MCV: 100 fL (ref 78.0–100.0)

## 2011-12-29 LAB — COMPREHENSIVE METABOLIC PANEL
ALT: 6 U/L (ref 0–35)
AST: 16 U/L (ref 0–37)
Albumin: 3.1 g/dL — ABNORMAL LOW (ref 3.5–5.2)
CO2: 24 mEq/L (ref 19–32)
Calcium: 7.9 mg/dL — ABNORMAL LOW (ref 8.4–10.5)
Chloride: 102 mEq/L (ref 96–112)
GFR calc non Af Amer: 3 mL/min — ABNORMAL LOW (ref >90)
Sodium: 141 mEq/L (ref 135–145)
Total Bilirubin: 0.5 mg/dL (ref 0.3–1.2)

## 2011-12-29 IMAGING — CT CT HEAD W/O CM
1 of 2 series · 13 of 30 positions shown, 17 images · non-contrast
Comparison: Head CT scan [DATE].

CLINICAL DATA: Subdural hematoma.

CT HEAD WITHOUT CONTRAST
TECHNIQUE: Contiguous axial images were obtained from the base of
the skull through the vertex without contrast.

[Series 2: brain · axial · 0.47mm/px · z∈[+137,+260]mm · 13 of 28 slices shown, 17 images]
[im 2/28  brain]
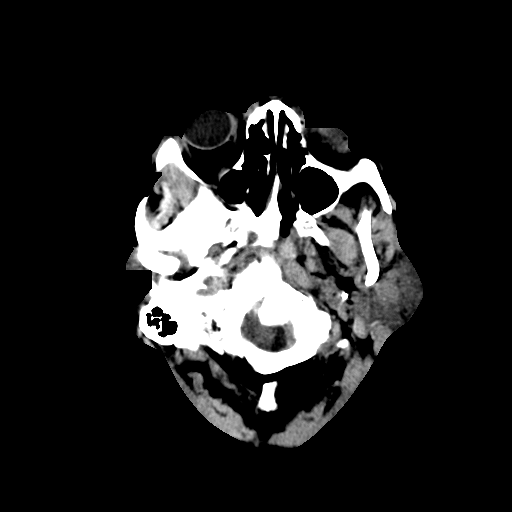
[im 2/28  bone]
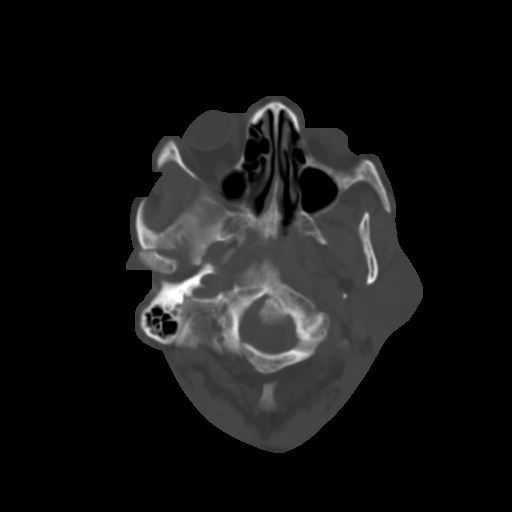
[im 4/28  brain]
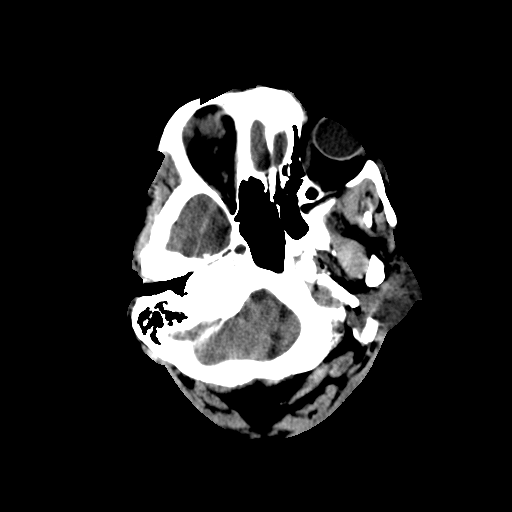
[im 6/28  brain]
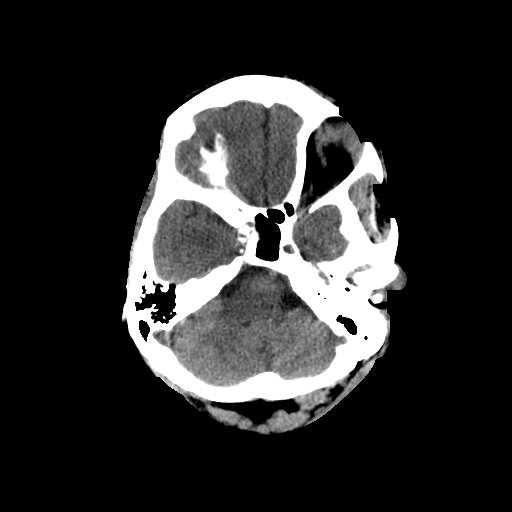
[im 8/28  brain]
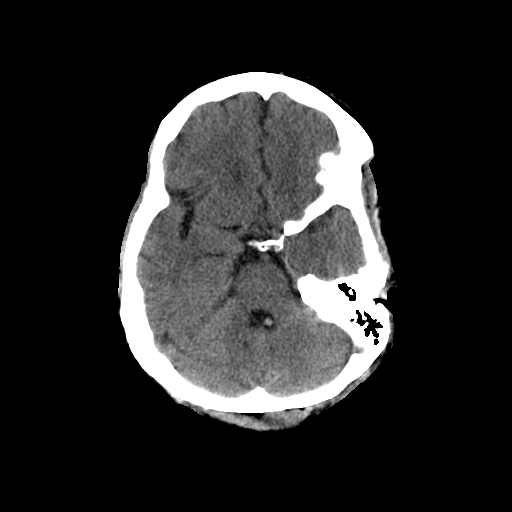
[im 10/28  brain]
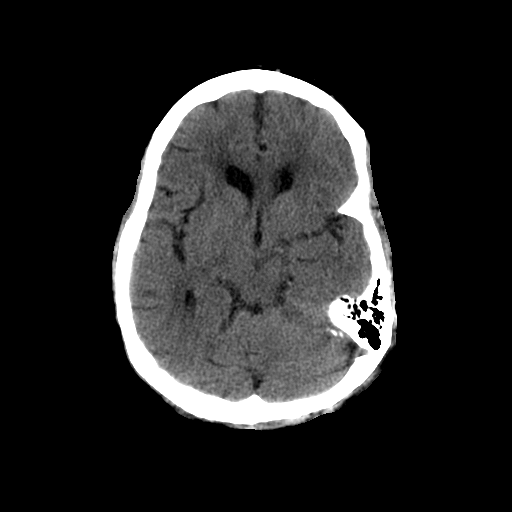
[im 10/28  bone]
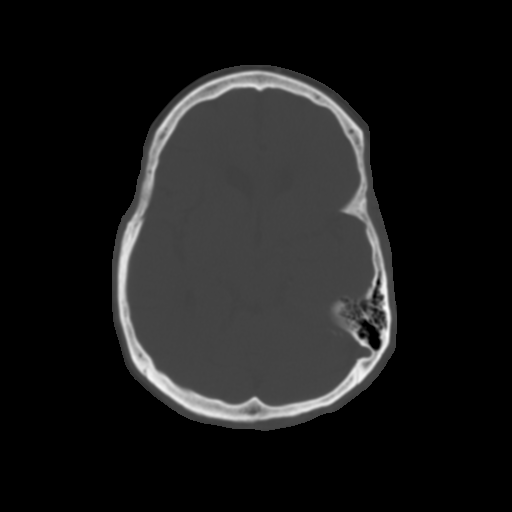
[im 12/28  brain]
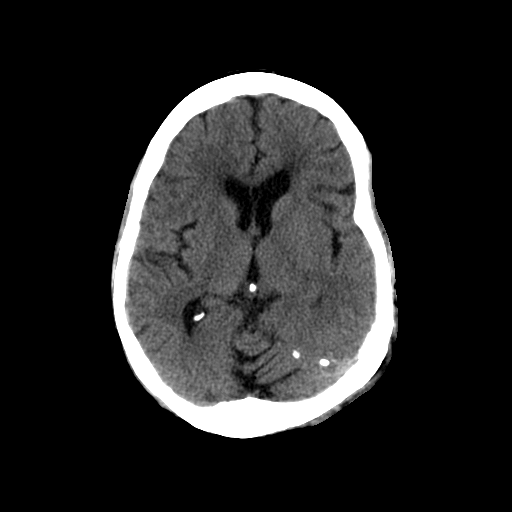
[im 14/28  brain]
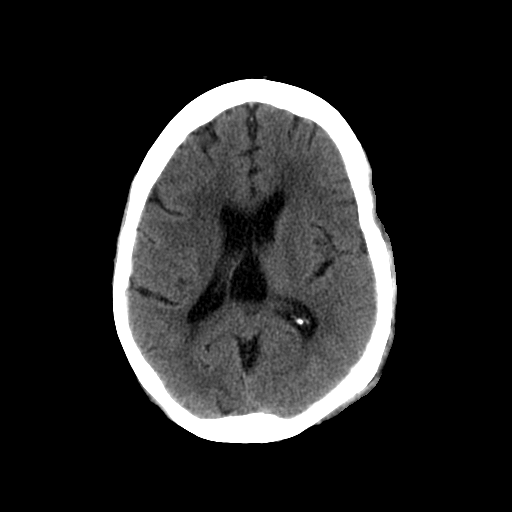
[im 16/28  brain]
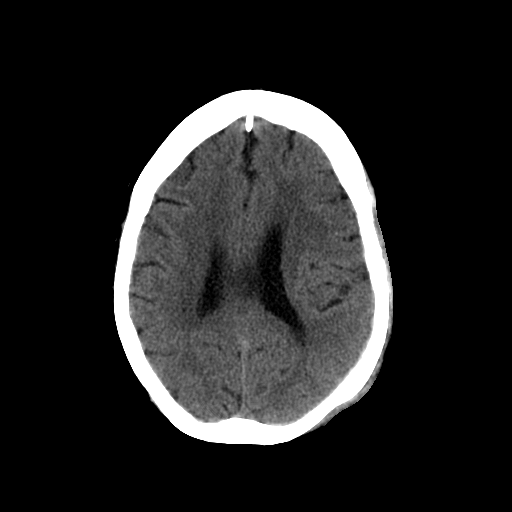
[im 18/28  brain]
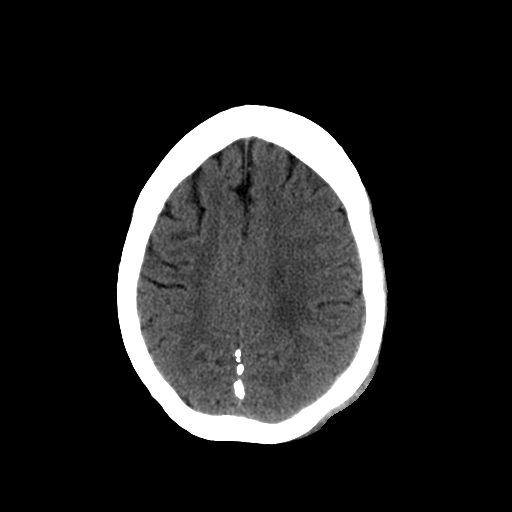
[im 18/28  bone]
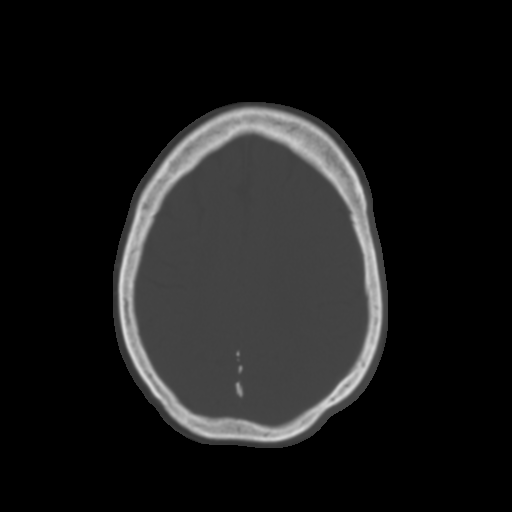
[im 20/28  brain]
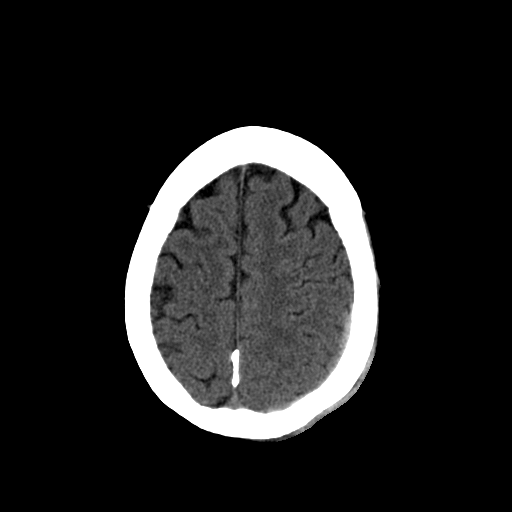
[im 22/28  brain]
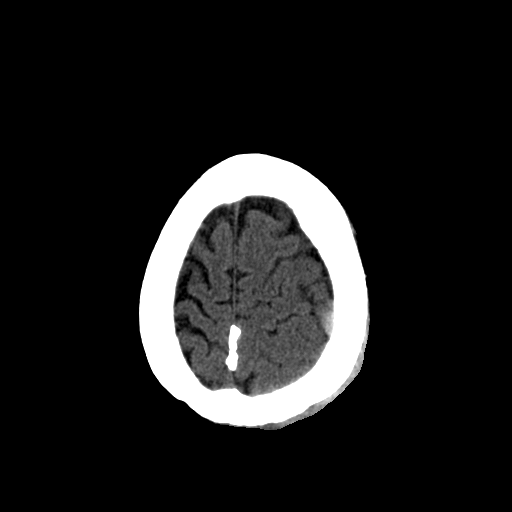
[im 24/28  brain]
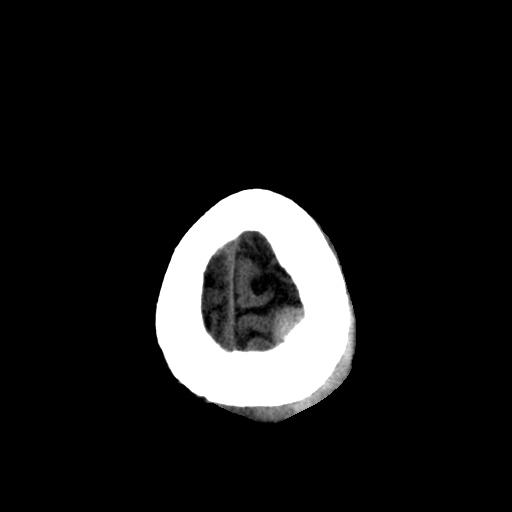
[im 26/28  brain]
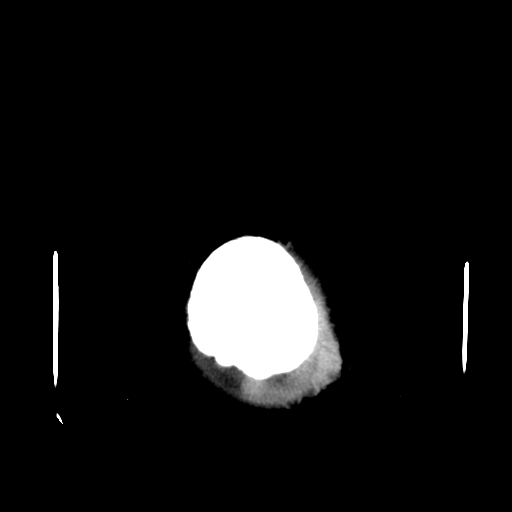
[im 26/28  bone]
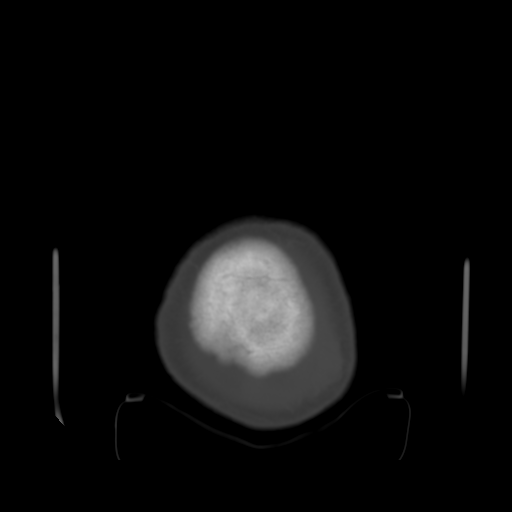

[13 of 30 positions shown; findings below may reference images not displayed]

FINDINGS: Extra-axial hematoma over the high left convexities
posteriorly is not markedly changed in thickness measuring
approximately 1 cm.  The collection appears slightly less dense.
Large overlying scalp hematoma is noted.  No new hemorrhage is
seen.  There is no midline shift, hydrocephalus, mass lesion or
evidence of acute infarction.  The calvarium is intact.
IMPRESSION: Small extra-axial hemorrhage over the high left convexities is not
markedly changed in thickness but appears slightly less dense
consistent with some evolutionary change.  Overlying scalp hematoma
again noted.  No new abnormality.

## 2011-12-29 IMAGING — CR DG WRIST COMPLETE 3+V*R*
4 series · 4 of 4 positions shown · non-contrast
Comparison: None.

CLINICAL DATA: Swelling and pain post fall.

RIGHT WRIST - COMPLETE 3+ VIEW

[x wrist pa right]
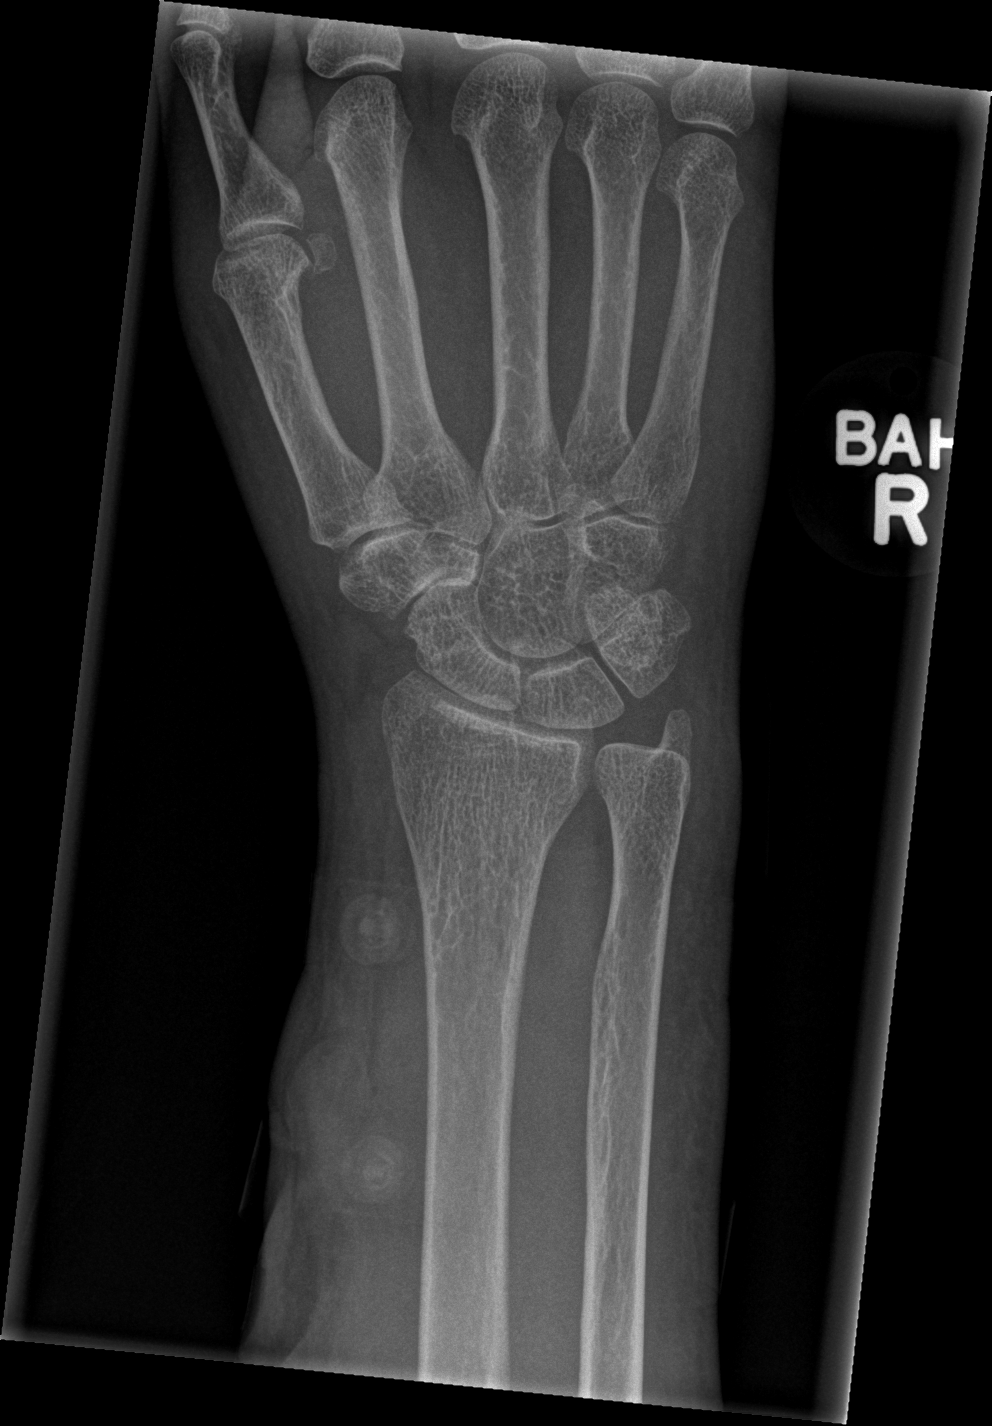

[x wrist obl right]
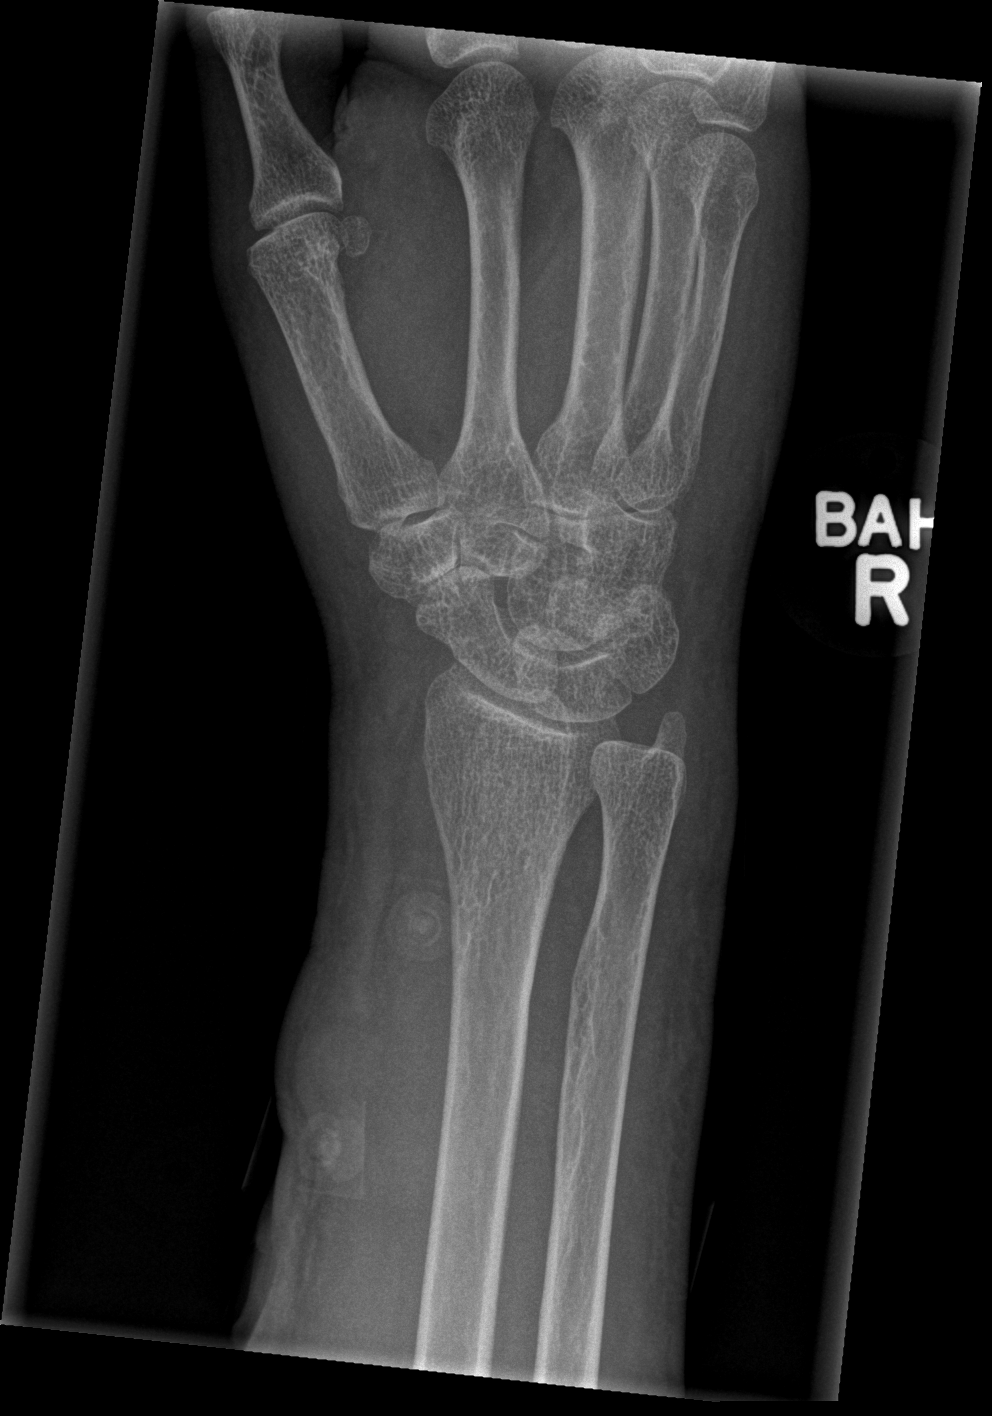

[x wrist lat right]
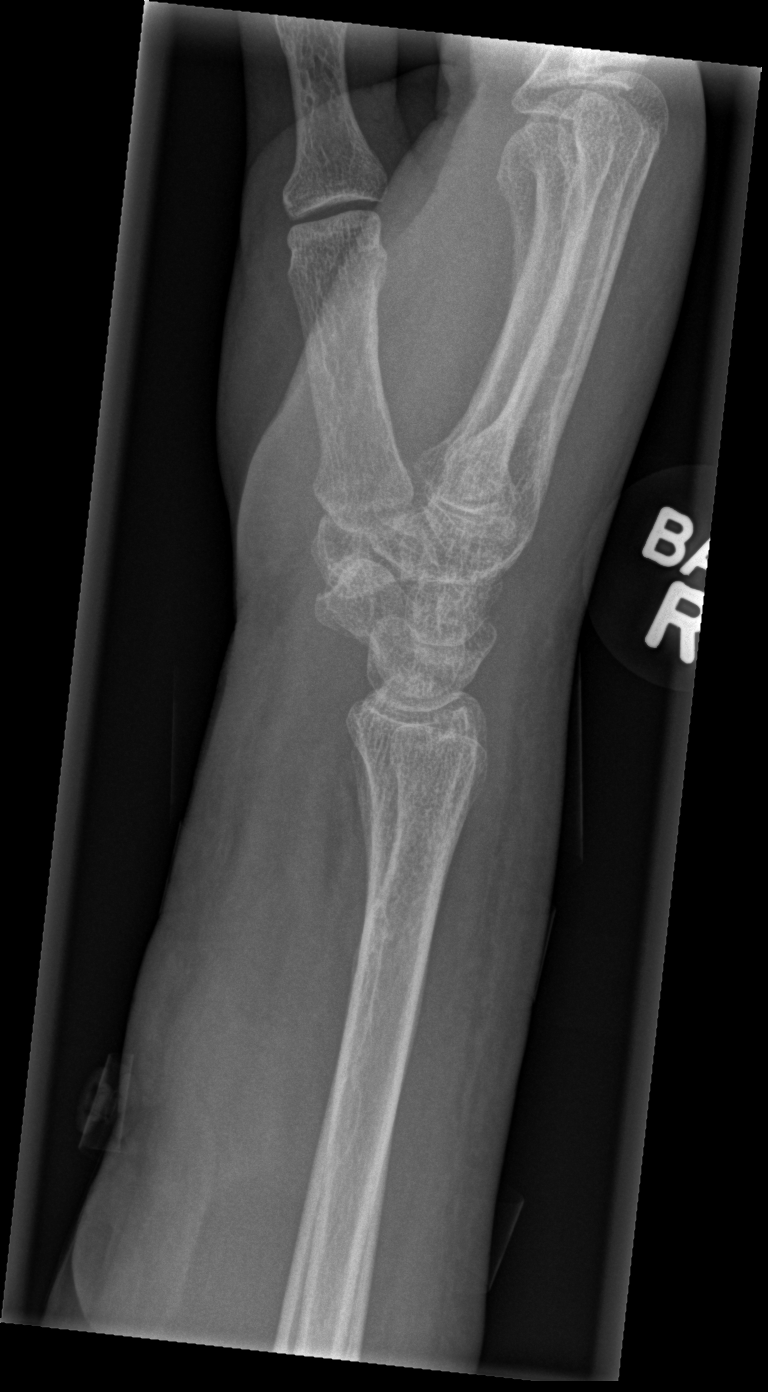

[x wrist navicular view right]
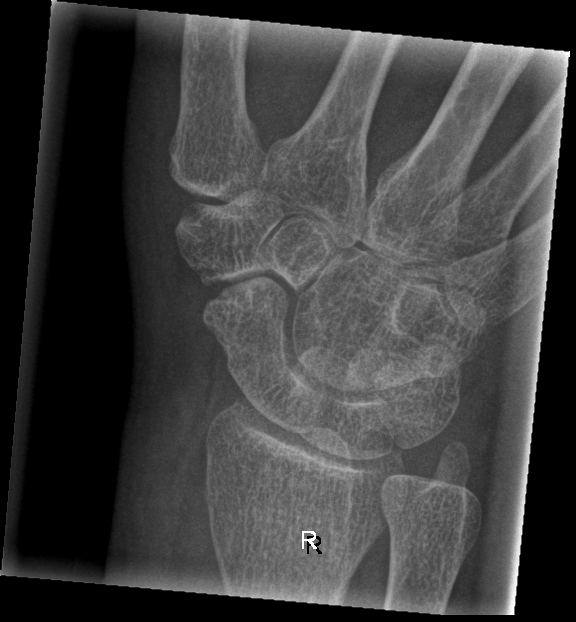

[4 of 4 positions shown; findings below may reference images not displayed]

FINDINGS: Diffuse osteopenia.  Carpal rows intact.  Negative for
fracture, dislocation, or other acute abnormality.  Normal
alignment  . No significant degenerative change.  Regional soft
tissues unremarkable.
IMPRESSION: Osteopenia without fracture or other acute abnormality

## 2011-12-29 MED ORDER — LAMIVUDINE 10 MG/ML PO SOLN
50.0000 mg | Freq: Every day | ORAL | Status: DC
  Filled 2011-12-29 (×4): qty 5

## 2011-12-29 MED ORDER — HEPARIN SODIUM (PORCINE) 1000 UNIT/ML DIALYSIS: 100.0000 [IU]/kg | INTRAMUSCULAR | Status: DC | PRN

## 2011-12-29 MED ORDER — DARBEPOETIN ALFA-POLYSORBATE 100 MCG/0.5ML IJ SOLN: 100.0000 ug | INTRAMUSCULAR | Status: DC

## 2011-12-29 MED ORDER — AMLODIPINE BESYLATE 10 MG PO TABS
10.0000 mg | ORAL_TABLET | Freq: Every day | ORAL | Status: DC
  Administered 2011-12-30 – 2011-12-31 (×2): 10 mg via ORAL
  Filled 2011-12-29 (×3): qty 1

## 2011-12-29 MED ORDER — OXYCODONE HCL 5 MG PO TABS
ORAL_TABLET | ORAL | Status: AC
  Filled 2011-12-29: qty 1

## 2011-12-29 MED ORDER — HEPARIN SODIUM (PORCINE) 1000 UNIT/ML DIALYSIS: 40.0000 [IU]/kg | Freq: Once | INTRAMUSCULAR | Status: DC

## 2011-12-29 MED ORDER — LANTHANUM CARBONATE 500 MG PO CHEW
1000.0000 mg | CHEWABLE_TABLET | Freq: Three times a day (TID) | ORAL | Status: DC
  Administered 2011-12-30: 1000 mg via ORAL
  Filled 2011-12-29 (×11): qty 2

## 2011-12-29 NOTE — Procedures (Signed)
I was present at this session.  I have reviewed the session itself and made appropriate changes.  Bp ^ to start, access ok.    [name] L 10/29/20133:33 PM

## 2011-12-29 NOTE — Progress Notes (Signed)
Pt transferred to unit from HD at 1905 c/o vomiting and medicated by Katharine Look HD Rn with 4 mg  Zofran Iv  . Pt verbalized relief of nausea and vomiting during rounding  On  telemetry NSR . No c/o at present   Late dinner tray  Order . Pt care endorsed to oncoming Rn

## 2011-12-29 NOTE — Progress Notes (Signed)
Orthopedic Tech Progress Note Patient Details:  [name] Sep 24, 1950 [id number]  Ortho Devices Type of Ortho Device: Velcro wrist splint Ortho Device/Splint Location: (R) UE Ortho Device/Splint Interventions: Application   Braulio Bosch 12/29/2011, 10:09 PM

## 2011-12-29 NOTE — Progress Notes (Signed)
Subjective: Patient reports Overall she's feeling fine stable headache some right arm pain  Objective: Vital signs in last 24 hours: Temp:  [97.7 F (36.5 C)-98.8 F (37.1 C)] 98.7 F (37.1 C) (10/29 0900) Pulse Rate:  [46-82] 71  (10/29 0500) Resp:  [13-27] 14  (10/29 0500) BP: (142-195)/(61-118) 142/68 mmHg (10/29 0936) SpO2:  [93 %-100 %] 98 % (10/29 0500) Weight:  [57.1 kg (125 lb 14.1 oz)-58.8 kg (129 lb 10.1 oz)] 58.8 kg (129 lb 10.1 oz) (10/29 0900)  Intake/Output from previous day: 10/28 0701 - 10/29 0700 In: 10 [I.V.:10] Out: -  Intake/Output this shift: Total I/O In: 243 [P.O.:240; I.V.:3] Out: -   Neurologically nonfocal awake alert oriented strength out of 5  Lab Results:  Basename 12/29/11 0525 12/28/11 1147  WBC 3.0* 2.7*  HGB 9.0* 9.7*  HCT 28.9* 30.9*  PLT 86* 69*   BMET  Basename 12/29/11 0525 12/28/11 2125  NA 141 138  K 4.0 4.1  CL 102 100  CO2 24 25  GLUCOSE 86 85  BUN 35* 31*  CREATININE 11.24* 10.37*  CALCIUM 7.9* 8.4    Studies/Results: Dg Elbow 2 Views Right  12/28/2011  *RADIOLOGY REPORT*  Clinical Data: Fall, pain.  RIGHT ELBOW - 2 VIEW  Comparison: None.  Findings: Imaged bones, joints and soft tissues appear normal.  IMPRESSION: Negative exam.   Original Report Authenticated By: Arvid Right. D'ALESSIO, M.D.    Dg Hip Complete Left  12/28/2011  *RADIOLOGY REPORT*  Clinical Data: Fall, left hip pain.  LEFT HIP - COMPLETE 2+ VIEW  Comparison: None.  Findings: Early degenerative changes in the hips bilaterally.  SI joints are symmetric and unremarkable. No acute bony abnormality. Specifically, no fracture, subluxation, or dislocation.  Soft tissues are intact.  IMPRESSION: No acute bony abnormality.   Original Report Authenticated By: Raelyn Number, M.D.    Ct Head Wo Contrast  12/29/2011  *RADIOLOGY REPORT*  Clinical Data: Subdural hematoma.  CT HEAD WITHOUT CONTRAST  Technique:  Contiguous axial images were obtained from the base of  the skull through the vertex without contrast.  Comparison: Head CT scan 12/28/2011.  Findings: Extra-axial hematoma over the high left convexities posteriorly is not markedly changed in thickness measuring approximately 1 cm.  The collection appears slightly less dense. Large overlying scalp hematoma is noted.  No new hemorrhage is seen.  There is no midline shift, hydrocephalus, mass lesion or evidence of acute infarction.  The calvarium is intact.  IMPRESSION: Small extra-axial hemorrhage over the high left convexities is not markedly changed in thickness but appears slightly less dense consistent with some evolutionary change.  Overlying scalp hematoma again noted.  No new abnormality.   Original Report Authenticated By: Arvid Right. Luther Parody, M.D.    Ct Head Wo Contrast  12/28/2011  *RADIOLOGY REPORT*  Clinical Data: Follow-up intracranial hemorrhage  CT HEAD WITHOUT CONTRAST  Technique:  Contiguous axial images were obtained from the base of the skull through the vertex without contrast.  Comparison: CT scan earlier in the day.  Findings: Persistent focal left parietal 10 mm thick collection, biconvex in shape, likely epidural hematoma is redemonstrated and unchanged from priors.  There is no skull fracture.  Left sided scalp hematoma remains.  Mild atrophy is present.  There is chronic microvascular ischemic change.  Cavum septum vergae.  No subarachnoid blood.  No midline shift.  Clear sinuses and mastoids.  IMPRESSION: Stable left parietal extra-axial collection, likely epidural hematoma.  No significant increase in size.  Stable scalp hematoma.   Original Report Authenticated By: Staci Righter, M.D.    Ct Head Wo Contrast  12/28/2011  *RADIOLOGY REPORT*  Clinical Data: Fall with left sided head trauma.  Severe left sided headache.  CT HEAD WITHOUT CONTRAST  Technique:  Contiguous axial images were obtained from the base of the skull through the vertex without contrast.  Comparison: 01/29/2009.   Findings: High attenuation material is seen along the inner table of the high left parietal bone, deep to an associated scalp hematoma.  No significant mass effect.  No evidence of acute infarct, mass lesion or hydrocephalus.  There is periventricular low attenuation.  No associated fracture.  No air fluid levels in the visualized portions of the paranasal sinuses.  IMPRESSION:  1.  Small extra-axial hematoma along the high left parietal lobe, with overlying scalp hematoma.  No associated fracture. Critical Value/emergent results were called by telephone at the time of interpretation on 12/28/2011 at 1138 hours to Dr. Rogene Houston, who verbally acknowledged these results. 2.  Chronic microvascular white matter ischemic changes.   Original Report Authenticated By: Luretha Rued, M.D.    Ct Cervical Spine Wo Contrast  12/28/2011  *RADIOLOGY REPORT*  Clinical Data: [place] today, severe headaches.  CT CERVICAL SPINE WITHOUT CONTRAST  Technique:  Multidetector CT imaging of the cervical spine was performed. Multiplanar CT image reconstructions were also generated.  Comparison: CT neck from 08/24/2002.  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  There is severe facet arthropathy at C3-4 and C4-5 on the left. Moderate facet arthropathy is noted at C2-3 on the left.  There is 2 mm of facet mediated slip C4-C5.  No traumatic subluxation is seen.  There is no cervical spine fracture.  No prevertebral soft tissue swelling.  Lung apices are clear.  Trachea midline.  Fairly well circumscribed lesions are noted in the right division of C2 and T1. These measure roughly 7 mm in cross section C2 and 5 mm cross-section at T1.  These are likely related to other smaller lesions noted posterior elements consistent with benign bone cysts. A similar lesion can be seen in T1 from 2004.  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  Advanced left-sided facet  arthropathy.  No cervical spine fracture or traumatic subluxation.  Multiple bones cysts as described.   Original Report Authenticated By: Staci Righter, M.D.    Dg Chest Port 1 View  12/28/2011  *RADIOLOGY REPORT*  Clinical Data: Fall, hypertension.  PORTABLE CHEST - 1 VIEW  Comparison: 09/20/2011.  Chest CT 01/22/2009.  Findings: Bilateral perihilar fullness again noted. When compared to prior CT from 2010, this is likely stable and related to prominent pulmonary arteries, likely pulmonary arterial hypertension.  No change since prior study.  Mild diffuse interstitial prominence and cardiomegaly.  No effusions.  No acute bony abnormality.  IMPRESSION: Stable bilateral perihilar prominence.  Interstitial prominence could reflect interstitial edema.   Original Report Authenticated By: Raelyn Number, M.D.     Assessment/Plan: CT stable this morning patient to be mobilized and as discharged when stable from medical standpoint. X-rays of her arm elbow were negative if the pain persists patient may need orthopedic followup  LOS: 1 day     Tolulope Pinkett P 12/29/2011, 10:18 AM

## 2011-12-29 NOTE — Consult Note (Signed)
Reason for Consult:ESRD, HTN, Anemia Referring Physician: Dr. Kathreen Cornfield is an 61 y.o. female.  HPI: 61 yr old female on HD at [place] and lives in assisted living.   Fell last pm and has L parietal epidural hematoma with no clinical deficit.  Hx HIV, on HD for 9 yr.  Also long hx HTN.  Does know dialysis meds.  C/o DOE <30 ft, no PND or edema.  No prob with HD.   ROS:  Dry mouth and chronic low vision No HA No cough, sputum or smoking No CP No N, V, but had D last week and freq occurrence No itching or cramping No arthritis L hand hurts where fell Chronic narcotics but no source of pain No rash No recurrent syncope Hx Depression but no hosp Hx pulm HTN on meds  Dialyzes at [facility] on TTS since 9 yr. Primary Nephrologist Dr. Hinda Lenis. EDW ??. HD Bath ?, Dialyzer ?, Heparin ?Marland Kitchen Access RLA AVF, ;Hx Lua avf clotted.  Past Medical History  Diagnosis Date  . Pulmonary HTN   . Hypertension   . Depression   . Insomnia   . Chronic diarrhea   . HIV (human immunodeficiency virus infection)   . ESRD on hemodialysis   . C. difficile colitis 10/10/11    Past Surgical History  Procedure Date  . Dialysis shunts   . Esophagogastroduodenoscopy 12/15/2010    Procedure: ESOPHAGOGASTRODUODENOSCOPY (EGD);  Surgeon: Daneil Dolin, MD;  Location: [facility];  Service: Endoscopy;  Laterality: N/A;    Family History  Problem Relation Age of Onset  . Anesthesia problems Neg Hx   . Hypotension Neg Hx   . Malignant hyperthermia Neg Hx   . Pseudochol deficiency Neg Hx     Social History:  reports that she has never smoked. She has never used smokeless tobacco. She reports that she does not drink alcohol or use illicit drugs.  Allergies:  Allergies  Allergen Reactions  . Penicillins     REACTION: rash  . Latex Rash    Medications:  I have reviewed the patient's current medications. Prior to Admission:  Prescriptions prior to admission  Medication  Sig Dispense Refill  . aluminum & magnesium hydroxide-simethicone (MYLANTA) 500-450-40 MG/5ML suspension Take 30 mLs by mouth every 6 (six) hours as needed. Indigestion      . b complex-vitamin c-folic acid (NEPHRO-VITE) 0.8 MG TABS Take 0.8 mg by mouth at bedtime.        . cinacalcet (SENSIPAR) 30 MG tablet Take 30 mg by mouth daily with supper.      . fentaNYL (DURAGESIC - DOSED MCG/HR) 25 MCG/HR Place 1 patch onto the skin every other day. Rotate sites      . hydroxypropyl methylcellulose (ISOPTO TEARS) 2.5 % ophthalmic solution Place 1 drop into both eyes 2 (two) times daily.        Marland Kitchen lamivudine (EPIVIR) 100 MG tablet Take 0.5 tablets (50 mg total) by mouth daily. Take one half tablet daily  150 tablet  90  . lanthanum (FOSRENOL) 500 MG chewable tablet Chew 500-1,500 mg by mouth 4 (four) times daily -  with meals and at bedtime. 1500 mg with meals and 500 mg at 8pm      . lidocaine (LIDODERM) 5 % Place 1 patch onto the skin daily. Remove & Discard patch within 12 hours or as directed by MD       . lidocaine-prilocaine (EMLA) cream Apply 1 application topically as needed.  Apply 1 hour prior to dialysis access site      . LORazepam (ATIVAN) 0.5 MG tablet Take 0.125 mg by mouth every 6 (six) hours as needed. Take 1/4 of a tablet For increased anxiety      . Menthol, Topical Analgesic, (BIOFREEZE EX) Apply 1 application topically 4 (four) times daily as needed. For pain **do not apply near dialysis site**      . ondansetron (ZOFRAN) 4 MG tablet Take 2 mg by mouth every 4 (four) hours as needed. nausea      . oxyCODONE (OXY IR/ROXICODONE) 5 MG immediate release tablet Take 5 mg by mouth at bedtime.      Marland Kitchen oxyCODONE (OXY IR/ROXICODONE) 5 MG immediate release tablet Take 5 mg by mouth every 6 (six) hours as needed. For pain      . pantoprazole (PROTONIX) 40 MG tablet Take 40 mg by mouth 2 (two) times daily.      . raltegravir (ISENTRESS) 400 MG tablet Take 400 mg by mouth 2 (two) times daily.        .  sildenafil (REVATIO) 20 MG tablet Take 20 mg by mouth 3 (three) times daily.        . vitamin C (ASCORBIC ACID) 500 MG tablet Take 500 mg by mouth 2 (two) times daily.      . Whey Protein Isolate POWD Take 1 Package by mouth 2 (two) times daily. Mix With vanilla pudding      . zidovudine (RETROVIR) 100 MG capsule Take 100 mg by mouth 3 (three) times daily.        Marland Kitchen ipratropium-albuterol (DUONEB) 0.5-2.5 (3) MG/3ML SOLN Take 3 mLs by nebulization every 6 (six) hours as needed. For shortness of breath/ wheezing       Zemplar ?, Hectorol ?, Calcitriol ?Marland Kitchen Epogen ?, Procrit ?, Aranesp ?Marland Kitchen Infed ?, Venofer ?  Results for orders placed during the hospital encounter of 12/28/11 (from the past 48 hour(s))  CBC WITH DIFFERENTIAL     Status: Abnormal   Collection Time   12/28/11 11:47 AM      Component Value Range Comment   WBC 2.7 (*) 4.0 - 10.5 K/uL    RBC 3.06 (*) 3.87 - 5.11 MIL/uL    Hemoglobin 9.7 (*) 12.0 - 15.0 g/dL    HCT 30.9 (*) 36.0 - 46.0 %    MCV 101.0 (*) 78.0 - 100.0 fL    MCH 31.7  26.0 - 34.0 pg    MCHC 31.4  30.0 - 36.0 g/dL    RDW 16.1 (*) 11.5 - 15.5 %    Platelets 69 (*) 150 - 400 K/uL    Neutrophils Relative 71  43 - 77 %    Neutro Abs 1.9  1.7 - 7.7 K/uL    Lymphocytes Relative 20  12 - 46 %    Lymphs Abs 0.6 (*) 0.7 - 4.0 K/uL    Monocytes Relative 7  3 - 12 %    Monocytes Absolute 0.2  0.1 - 1.0 K/uL    Eosinophils Relative 1  0 - 5 %    Eosinophils Absolute 0.0  0.0 - 0.7 K/uL    Basophils Relative 1  0 - 1 %    Basophils Absolute 0.0  0.0 - 0.1 K/uL   COMPREHENSIVE METABOLIC PANEL     Status: Abnormal   Collection Time   12/28/11 11:47 AM      Component Value Range Comment   Sodium 137  135 - 145  mEq/L    Potassium 3.7  3.5 - 5.1 mEq/L    Chloride 100  96 - 112 mEq/L    CO2 26  19 - 32 mEq/L    Glucose, Bld 102 (*) 70 - 99 mg/dL    BUN 30 (*) 6 - 23 mg/dL    Creatinine, Ser 9.87 (*) 0.50 - 1.10 mg/dL    Calcium 8.6  8.4 - 10.5 mg/dL    Total Protein 8.9  (*) 6.0 - 8.3 g/dL    Albumin 3.4 (*) 3.5 - 5.2 g/dL    AST 21  0 - 37 U/L    ALT 8  0 - 35 U/L    Alkaline Phosphatase 99  39 - 117 U/L    Total Bilirubin 0.5  0.3 - 1.2 mg/dL    GFR calc non Af Amer 4 (*) >90 mL/min    GFR calc Af Amer 4 (*) >90 mL/min   PROTIME-INR     Status: Normal   Collection Time   12/28/11  2:03 PM      Component Value Range Comment   Prothrombin Time 14.4  11.6 - 15.2 seconds    INR 1.14  0.00 - 1.49   APTT     Status: Abnormal   Collection Time   12/28/11  2:03 PM      Component Value Range Comment   aPTT 38 (*) 24 - 37 seconds   MRSA PCR SCREENING     Status: Normal   Collection Time   12/28/11  5:16 PM      Component Value Range Comment   MRSA by PCR NEGATIVE  NEGATIVE   RENAL FUNCTION PANEL     Status: Abnormal   Collection Time   12/28/11  9:25 PM      Component Value Range Comment   Sodium 138  135 - 145 mEq/L    Potassium 4.1  3.5 - 5.1 mEq/L    Chloride 100  96 - 112 mEq/L    CO2 25  19 - 32 mEq/L    Glucose, Bld 85  70 - 99 mg/dL    BUN 31 (*) 6 - 23 mg/dL    Creatinine, Ser 10.37 (*) 0.50 - 1.10 mg/dL    Calcium 8.4  8.4 - 10.5 mg/dL    Phosphorus 5.6 (*) 2.3 - 4.6 mg/dL    Albumin 3.1 (*) 3.5 - 5.2 g/dL    GFR calc non Af Amer 4 (*) >90 mL/min    GFR calc Af Amer 4 (*) >90 mL/min   COMPREHENSIVE METABOLIC PANEL     Status: Abnormal   Collection Time   12/29/11  5:25 AM      Component Value Range Comment   Sodium 141  135 - 145 mEq/L    Potassium 4.0  3.5 - 5.1 mEq/L    Chloride 102  96 - 112 mEq/L    CO2 24  19 - 32 mEq/L    Glucose, Bld 86  70 - 99 mg/dL    BUN 35 (*) 6 - 23 mg/dL    Creatinine, Ser 11.24 (*) 0.50 - 1.10 mg/dL    Calcium 7.9 (*) 8.4 - 10.5 mg/dL    Total Protein 8.0  6.0 - 8.3 g/dL    Albumin 3.1 (*) 3.5 - 5.2 g/dL    AST 16  0 - 37 U/L    ALT 6  0 - 35 U/L    Alkaline Phosphatase 90  39 - 117 U/L  Total Bilirubin 0.5  0.3 - 1.2 mg/dL    GFR calc non Af Amer 3 (*) >90 mL/min    GFR calc Af Amer 4 (*)  >90 mL/min   CBC     Status: Abnormal   Collection Time   12/29/11  5:25 AM      Component Value Range Comment   WBC 3.0 (*) 4.0 - 10.5 K/uL    RBC 2.89 (*) 3.87 - 5.11 MIL/uL    Hemoglobin 9.0 (*) 12.0 - 15.0 g/dL    HCT 28.9 (*) 36.0 - 46.0 %    MCV 100.0  78.0 - 100.0 fL    MCH 31.1  26.0 - 34.0 pg    MCHC 31.1  30.0 - 36.0 g/dL    RDW 15.8 (*) 11.5 - 15.5 %    Platelets 86 (*) 150 - 400 K/uL PLATELET COUNT CONFIRMED BY SMEAR    Dg Elbow 2 Views Right  12/28/2011  *RADIOLOGY REPORT*  Clinical Data: Fall, pain.  RIGHT ELBOW - 2 VIEW  Comparison: None.  Findings: Imaged bones, joints and soft tissues appear normal.  IMPRESSION: Negative exam.   Original Report Authenticated By: Arvid Right. D'ALESSIO, M.D.    Dg Hip Complete Left  12/28/2011  *RADIOLOGY REPORT*  Clinical Data: Fall, left hip pain.  LEFT HIP - COMPLETE 2+ VIEW  Comparison: None.  Findings: Early degenerative changes in the hips bilaterally.  SI joints are symmetric and unremarkable. No acute bony abnormality. Specifically, no fracture, subluxation, or dislocation.  Soft tissues are intact.  IMPRESSION: No acute bony abnormality.   Original Report Authenticated By: Raelyn Number, M.D.    Ct Head Wo Contrast  12/29/2011  *RADIOLOGY REPORT*  Clinical Data: Subdural hematoma.  CT HEAD WITHOUT CONTRAST  Technique:  Contiguous axial images were obtained from the base of the skull through the vertex without contrast.  Comparison: Head CT scan 12/28/2011.  Findings: Extra-axial hematoma over the high left convexities posteriorly is not markedly changed in thickness measuring approximately 1 cm.  The collection appears slightly less dense. Large overlying scalp hematoma is noted.  No new hemorrhage is seen.  There is no midline shift, hydrocephalus, mass lesion or evidence of acute infarction.  The calvarium is intact.  IMPRESSION: Small extra-axial hemorrhage over the high left convexities is not markedly changed in thickness but  appears slightly less dense consistent with some evolutionary change.  Overlying scalp hematoma again noted.  No new abnormality.   Original Report Authenticated By: Arvid Right. Luther Parody, M.D.    Ct Head Wo Contrast  12/28/2011  *RADIOLOGY REPORT*  Clinical Data: Follow-up intracranial hemorrhage  CT HEAD WITHOUT CONTRAST  Technique:  Contiguous axial images were obtained from the base of the skull through the vertex without contrast.  Comparison: CT scan earlier in the day.  Findings: Persistent focal left parietal 10 mm thick collection, biconvex in shape, likely epidural hematoma is redemonstrated and unchanged from priors.  There is no skull fracture.  Left sided scalp hematoma remains.  Mild atrophy is present.  There is chronic microvascular ischemic change.  Cavum septum vergae.  No subarachnoid blood.  No midline shift.  Clear sinuses and mastoids.  IMPRESSION: Stable left parietal extra-axial collection, likely epidural hematoma.  No significant increase in size.  Stable scalp hematoma.   Original Report Authenticated By: Staci Righter, M.D.    Ct Head Wo Contrast  12/28/2011  *RADIOLOGY REPORT*  Clinical Data: Fall with left sided head trauma.  Severe left  sided headache.  CT HEAD WITHOUT CONTRAST  Technique:  Contiguous axial images were obtained from the base of the skull through the vertex without contrast.  Comparison: 01/29/2009.  Findings: High attenuation material is seen along the inner table of the high left parietal bone, deep to an associated scalp hematoma.  No significant mass effect.  No evidence of acute infarct, mass lesion or hydrocephalus.  There is periventricular low attenuation.  No associated fracture.  No air fluid levels in the visualized portions of the paranasal sinuses.  IMPRESSION:  1.  Small extra-axial hematoma along the high left parietal lobe, with overlying scalp hematoma.  No associated fracture. Critical Value/emergent results were called by telephone at the time  of interpretation on 12/28/2011 at 1138 hours to Dr. Rogene Houston, who verbally acknowledged these results. 2.  Chronic microvascular white matter ischemic changes.   Original Report Authenticated By: Luretha Rued, M.D.    Ct Cervical Spine Wo Contrast  12/28/2011  *RADIOLOGY REPORT*  Clinical Data: [place] today, severe headaches.  CT CERVICAL SPINE WITHOUT CONTRAST  Technique:  Multidetector CT imaging of the cervical spine was performed. Multiplanar CT image reconstructions were also generated.  Comparison: CT neck from 08/24/2002.  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  There is severe facet arthropathy at C3-4 and C4-5 on the left. Moderate facet arthropathy is noted at C2-3 on the left.  There is 2 mm of facet mediated slip C4-C5.  No traumatic subluxation is seen.  There is no cervical spine fracture.  No prevertebral soft tissue swelling.  Lung apices are clear.  Trachea midline.  Fairly well circumscribed lesions are noted in the right division of C2 and T1. These measure roughly 7 mm in cross section C2 and 5 mm cross-section at T1.  These are likely related to other smaller lesions noted posterior elements consistent with benign bone cysts. A similar lesion can be seen in T1 from 2004.  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  Advanced left-sided facet arthropathy.  No cervical spine fracture or traumatic subluxation.  Multiple bones cysts as described.   Original Report Authenticated By: Staci Righter, M.D.    Dg Chest Port 1 View  12/28/2011  *RADIOLOGY REPORT*  Clinical Data: Fall, hypertension.  PORTABLE CHEST - 1 VIEW  Comparison: 09/20/2011.  Chest CT 01/22/2009.  Findings: Bilateral perihilar fullness again noted. When compared to prior CT from 2010, this is likely stable and related to prominent pulmonary arteries, likely pulmonary arterial hypertension.  No change since prior study.  Mild diffuse  interstitial prominence and cardiomegaly.  No effusions.  No acute bony abnormality.  IMPRESSION: Stable bilateral perihilar prominence.  Interstitial prominence could reflect interstitial edema.   Original Report Authenticated By: Raelyn Number, M.D.     @ROS @ Blood pressure 142/68, pulse 71, temperature 98.2 F (36.8 C), temperature source Oral, resp. rate 14, height 5\' 1"  (1.549 m), weight 58.8 kg (129 lb 10.1 oz), SpO2 98.00%. @PHYSEXAMBYAGE2 @ Physical Examination: General appearance - alert, well appearing, and in no distress Mental status - alert, oriented to person, place, and time Eyes - pupils equal and reactive, extraocular eye movements intact, sclera anicteric, funduscopic exam normal, discs flat and sharp Mouth - mucous membranes moist, pharynx normal without lesions and large tongue Neck - adenopathy noted PCL Lymphatics - posterior cervical nodes, enlarged lymph nodes palpated L axilla Chest - rales noted fine rales diffusely, decreased air entry noted  bilat Heart - normal rate, regular rhythm, normal S1, S2, no murmurs, rubs, clicks or gallops, S1 and S2 normal, systolic murmur [id number] at apex Abdomen - hepatomegaly liver down 5 cm Soft, pos bs Neurological - alert, oriented, normal speech, no focal findings or movement disorder noted Musculoskeletal - tender swollen L wrist Extremities - peripheral pulses normal, no pedal edema, no clubbing or cyanosis, as above, old clotted AVF LUA, AVF With B&T RLA Skin - pale, fragile  Assessment/Plan: 1 Epidural bleed,per NS, will give no hep at HD 2 ESRD: will dialyze, and get vol down, adjust meds for HD, get records 3 Hypertension: lower vol, adjust meds 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  Keilen Kahl L 12/29/2011, 1:12 PM

## 2011-12-29 NOTE — Progress Notes (Signed)
Patient going to HD then will transfer to [place] [facility] after treatment. Per MD order. Report called to Percival Spanish, RN on [place].

## 2011-12-29 NOTE — Progress Notes (Signed)
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 female transferred from [facility] on 12/28/11, by Dr Hurshel Party. NH resident, with known history of HIV disease on HAART, HTN, depression, ESRD on HD T-T-S, chronic diarrhea, previous C. Difficile colitis, admitted to [facility] following a fall in the nursing home today. She was trying to escape from a spider on the floor. She hit the left side of her head. She did not lose consciousness. On evaluation in the ED, she was found to have a left scalp hematoma, and imaging studies confirmed a small extra-axial hematoma along the high left parietal lobe. Neurologically, she has remained stable, but was transferred in case neurosurgical intervention becomes necessary.    Consultants:  NS  nephrology  Procedures:  none  Antibiotics:  none  HPI/Subjective: Pt is complaining of pain in her right wrist. Pain in her head is not too bad.   Objective: Filed Vitals:   12/29/11 1530 12/29/11 1600 12/29/11 1630 12/29/11 1700  BP: 142/72 136/69 172/83 168/79  Pulse: 59 65 70 71  Temp:      TempSrc:      Resp: 12 20 16 20   Height:      Weight:      SpO2:        Intake/Output Summary (Last 24 hours) at 12/29/11 1707 Last data filed at 12/29/11 1300  Gross per 24 hour  Intake    423 ml  Output      0 ml  Net    423 ml    Exam:   General:  Alert, no distress  Cardiovascular: RRR, no murmurs  Respiratory: CTA  b/l  Abdomen: soft, NT, ND, BS+  Ext: no c/c/e  Data Reviewed: Basic Metabolic Panel:  Lab [id number] 0525 12/28/11 2125 12/28/11 1147  NA 141 138 137  K 4.0 4.1 3.7  CL 102 100 100  CO2 24 25 26   GLUCOSE 86 85 102*  BUN 35* 31* 30*  CREATININE 11.24* 10.37* 9.87*  CALCIUM 7.9* 8.4 8.6  MG -- -- --  PHOS -- 5.6* --   Liver Function Tests:  Lab 12/29/11 0525 12/28/11 2125 12/28/11 1147  AST 16 -- 21  ALT 6 -- 8  ALKPHOS 90 -- 99  BILITOT 0.5 -- 0.5  PROT 8.0 -- 8.9*  ALBUMIN 3.1* 3.1* 3.4*   No results found for this basename: LIPASE:5,AMYLASE:5 in the last 168 hours No results found for this basename: AMMONIA:5 in the last 168 hours CBC:  Lab 12/29/11 0525 12/28/11 1147  WBC 3.0* 2.7*  NEUTROABS -- 1.9  HGB 9.0* 9.7*  HCT 28.9* 30.9*  MCV 100.0 101.0*  PLT 86* 69*   Cardiac Enzymes: No results found for this basename: CKTOTAL:5,CKMB:5,CKMBINDEX:5,TROPONINI:5 in the last 168 hours BNP (last 3 results)  Basename 03/09/11 1503  PROBNP 3673.0*   CBG: No results found for this basename: GLUCAP:5 in the last 168 hours  Recent Results (from the past 240 hour(s))  MRSA PCR SCREENING     Status:  Normal   Collection Time   Dec 31, 2011  5:16 PM      Component Value Range Status Comment   MRSA by PCR NEGATIVE  NEGATIVE Final      Studies: Dg Elbow 2 Views Right  2011-12-31  *RADIOLOGY REPORT*  Clinical Data: Fall, pain.  RIGHT ELBOW - 2 VIEW  Comparison: None.  Findings: Imaged bones, joints and soft tissues appear normal.  IMPRESSION: Negative exam.   Original Report Authenticated By: Arvid Right. D'ALESSIO, M.D.    Dg Hip Complete Left  12-31-2011  *RADIOLOGY REPORT*  Clinical Data: Fall, left hip pain.  LEFT HIP - COMPLETE 2+ VIEW  Comparison: None.  Findings: Early degenerative changes in the hips bilaterally.  SI joints are symmetric and unremarkable. No acute bony abnormality. Specifically, no fracture, subluxation, or dislocation.  Soft tissues are intact.   IMPRESSION: No acute bony abnormality.   Original Report Authenticated By: Raelyn Number, M.D.    Ct Head Wo Contrast  12/29/2011  *RADIOLOGY REPORT*  Clinical Data: Subdural hematoma.  CT HEAD WITHOUT CONTRAST  Technique:  Contiguous axial images were obtained from the base of the skull through the vertex without contrast.  Comparison: Head CT scan 12-31-11.  Findings: Extra-axial hematoma over the high left convexities posteriorly is not markedly changed in thickness measuring approximately 1 cm.  The collection appears slightly less dense. Large overlying scalp hematoma is noted.  No new hemorrhage is seen.  There is no midline shift, hydrocephalus, mass lesion or evidence of acute infarction.  The calvarium is intact.  IMPRESSION: Small extra-axial hemorrhage over the high left convexities is not markedly changed in thickness but appears slightly less dense consistent with some evolutionary change.  Overlying scalp hematoma again noted.  No new abnormality.   Original Report Authenticated By: Arvid Right. Luther Parody, M.D.    Ct Head Wo Contrast  December 31, 2011  *RADIOLOGY REPORT*  Clinical Data: Follow-up intracranial hemorrhage  CT HEAD WITHOUT CONTRAST  Technique:  Contiguous axial images were obtained from the base of the skull through the vertex without contrast.  Comparison: CT scan earlier in the day.  Findings: Persistent focal left parietal 10 mm thick collection, biconvex in shape, likely epidural hematoma is redemonstrated and unchanged from priors.  There is no skull fracture.  Left sided scalp hematoma remains.  Mild atrophy is present.  There is chronic microvascular ischemic change.  Cavum septum vergae.  No subarachnoid blood.  No midline shift.  Clear sinuses and mastoids.  IMPRESSION: Stable left parietal extra-axial collection, likely epidural hematoma.  No significant increase in size.  Stable scalp hematoma.   Original Report Authenticated By: Staci Righter, M.D.    Ct Head Wo  Contrast  December 31, 2011  *RADIOLOGY REPORT*  Clinical Data: Fall with left sided head trauma.  Severe left sided headache.  CT HEAD WITHOUT CONTRAST  Technique:  Contiguous axial images were obtained from the base of the skull through the vertex without contrast.  Comparison: 01/29/2009.  Findings: High attenuation material is seen along the inner table of the high left parietal bone, deep to an associated scalp hematoma.  No significant mass effect.  No evidence of acute infarct, mass lesion or hydrocephalus.  There is periventricular low attenuation.  No associated fracture.  No air fluid levels in the visualized portions of the paranasal sinuses.  IMPRESSION:  1.  Small extra-axial hematoma along the high left parietal lobe, with overlying scalp hematoma.  No associated fracture. Critical Value/emergent results were called by telephone at  the time of interpretation on 12/28/2011 at 1138 hours to Dr. Rogene Houston, who verbally acknowledged these results. 2.  Chronic microvascular white matter ischemic changes.   Original Report Authenticated By: Luretha Rued, M.D.    Ct Cervical Spine Wo Contrast  12/28/2011  *RADIOLOGY REPORT*  Clinical Data: [place] today, severe headaches.  CT CERVICAL SPINE WITHOUT CONTRAST  Technique:  Multidetector CT imaging of the cervical spine was performed. Multiplanar CT image reconstructions were also generated.  Comparison: CT neck from 08/24/2002.  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  There is severe facet arthropathy at C3-4 and C4-5 on the left. Moderate facet arthropathy is noted at C2-3 on the left.  There is 2 mm of facet mediated slip C4-C5.  No traumatic subluxation is seen.  There is no cervical spine fracture.  No prevertebral soft tissue swelling.  Lung apices are clear.  Trachea midline.  Fairly well circumscribed lesions are noted in the right division of C2 and T1. These measure roughly 7 mm in cross section  C2 and 5 mm cross-section at T1.  These are likely related to other smaller lesions noted posterior elements consistent with benign bone cysts. A similar lesion can be seen in T1 from 2004.  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  Advanced left-sided facet arthropathy.  No cervical spine fracture or traumatic subluxation.  Multiple bones cysts as described.   Original Report Authenticated By: Staci Righter, M.D.    Dg Chest Port 1 View  12/28/2011  *RADIOLOGY REPORT*  Clinical Data: Fall, hypertension.  PORTABLE CHEST - 1 VIEW  Comparison: 09/20/2011.  Chest CT 01/22/2009.  Findings: Bilateral perihilar fullness again noted. When compared to prior CT from 2010, this is likely stable and related to prominent pulmonary arteries, likely pulmonary arterial hypertension.  No change since prior study.  Mild diffuse interstitial prominence and cardiomegaly.  No effusions.  No acute bony abnormality.  IMPRESSION: Stable bilateral perihilar prominence.  Interstitial prominence could reflect interstitial edema.   Original Report Authenticated By: Raelyn Number, M.D.     Scheduled Meds:   . amLODipine  10 mg Oral QHS  . cinacalcet  30 mg Oral Q supper  . darbepoetin (ARANESP) injection - DIALYSIS  100 mcg Intravenous Q Tue-HD  . feeding supplement  30 mL Oral BID WC  . fentaNYL  25 mcg Transdermal Q48H  . heparin  40 Units/kg Dialysis Once in dialysis  . lamiVUDine  50 mg Oral Daily  . lanthanum  1,000 mg Oral TID WC  . lidocaine  1 patch Transdermal Q24H  . metoprolol tartrate  50 mg Oral Once  . multivitamin  1 tablet Oral Daily  . pantoprazole  40 mg Oral BID  . polyvinyl alcohol  1 drop Both Eyes BID  . raltegravir  400 mg Oral BID  . sildenafil  20 mg Oral TID  . sodium chloride  3 mL Intravenous Q12H  . zidovudine  100 mg Oral TID  . DISCONTD: amLODipine  10 mg Oral Daily  . DISCONTD: hydroxypropyl methylcellulose  1 drop Both Eyes BID  . DISCONTD: lamivudine   50 mg Oral Daily  . DISCONTD: lanthanum  1,500 mg Oral TID WC  . DISCONTD: lanthanum  500 mg Oral Q2000  . DISCONTD: lidocaine  1 patch Transdermal Q24H  . DISCONTD: Whey Protein Isolate  1 Package Oral BID   Continuous Infusions:   . DISCONTD: sodium chloride  50 mL/hr at 12/28/11 1201    ________________________________________________________________________  Time spent: 20 min    [place] [facility] Pager [phone] If 8PM-8AM, please contact night-coverage at [email or web address], password [facility] 12/29/2011, 5:07 PM  LOS: 1 day

## 2011-12-29 NOTE — Progress Notes (Signed)
Pt. Was experiencing nausea and vomiting; md paged for IV Zofran; orders received, medication given.  Pt. Resting now.  10 IV hydralazine given for BP 170/95; BP now 143./77.  Will continue to monitor.  Renwick Cellar RN

## 2011-12-30 DIAGNOSIS — I10 Essential (primary) hypertension: Secondary | ICD-10-CM

## 2011-12-30 DIAGNOSIS — N039 Chronic nephritic syndrome with unspecified morphologic changes: Secondary | ICD-10-CM | POA: Diagnosis not present

## 2011-12-30 DIAGNOSIS — R51 Headache: Secondary | ICD-10-CM | POA: Diagnosis not present

## 2011-12-30 DIAGNOSIS — M25529 Pain in unspecified elbow: Secondary | ICD-10-CM

## 2011-12-30 DIAGNOSIS — Z21 Asymptomatic human immunodeficiency virus [HIV] infection status: Secondary | ICD-10-CM | POA: Diagnosis not present

## 2011-12-30 DIAGNOSIS — S0990XA Unspecified injury of head, initial encounter: Secondary | ICD-10-CM | POA: Diagnosis not present

## 2011-12-30 DIAGNOSIS — D649 Anemia, unspecified: Secondary | ICD-10-CM | POA: Diagnosis not present

## 2011-12-30 DIAGNOSIS — I120 Hypertensive chronic kidney disease with stage 5 chronic kidney disease or end stage renal disease: Secondary | ICD-10-CM | POA: Diagnosis not present

## 2011-12-30 MED ORDER — SODIUM CHLORIDE 0.9 % IV SOLN: 100.0000 mL | INTRAVENOUS | Status: DC | PRN

## 2011-12-30 MED ORDER — HEPARIN SODIUM (PORCINE) 1000 UNIT/ML DIALYSIS
1000.0000 [IU] | INTRAMUSCULAR | Status: DC | PRN
  Filled 2011-12-30: qty 1

## 2011-12-30 MED ORDER — NEPRO/CARBSTEADY PO LIQD
237.0000 mL | ORAL | Status: DC | PRN
  Filled 2011-12-30: qty 237

## 2011-12-30 MED ORDER — LIDOCAINE HCL (PF) 1 % IJ SOLN: 5.0000 mL | INTRAMUSCULAR | Status: DC | PRN

## 2011-12-30 MED ORDER — PENTAFLUOROPROP-TETRAFLUOROETH EX AERO: 1.0000 "application " | INHALATION_SPRAY | CUTANEOUS | Status: DC | PRN

## 2011-12-30 MED ORDER — LIDOCAINE-PRILOCAINE 2.5-2.5 % EX CREA
1.0000 "application " | TOPICAL_CREAM | CUTANEOUS | Status: DC | PRN
  Filled 2011-12-30: qty 5

## 2011-12-30 MED ORDER — ALTEPLASE 2 MG IJ SOLR
2.0000 mg | Freq: Once | INTRAMUSCULAR | Status: AC | PRN
  Filled 2011-12-30: qty 2

## 2011-12-30 NOTE — Progress Notes (Signed)
[facility] PROGRESS NOTE  NEILA HOLTS [id number] DOB: 1950/04/16 DOA: 12/28/2011 PCP: Cyndee Brightly, MD  Assessment/Plan: Principal Problem:  *Intracranial bleed Due to trauma- bleed is stable. Monitor closely and consider d/c to SNF likely tomorrow 12/31/11  Right wrist swelling F/u xray showed osteopenia without fracture or other acute abnormality   HUMAN IMMUNODEFICIENCY VIRUS [HIV] Continue home medications   End stage renal disease Dialysis per nephrology  HTN Cont current medications  Anemia - Decrease in hemoglobin level from 9.7 to 9.0 on 10/30. - Will monitor overnight and recheck level next am.   Code Status: Full code Disposition Plan: transfer out of SDU DVT prophylaxis: SCDs   Brief narrative: 61 y.o. female transferred from [place] on 12/28/11, by Dr Hurshel Party. NH resident, with known history of HIV disease on HAART, HTN, depression, ESRD on HD T-T-S, chronic diarrhea, previous C. Difficile colitis, admitted to [facility] following a fall in the nursing home today. She was trying to escape from a spider on the floor. She hit the left side of her head. She did not lose consciousness. On evaluation in the ED, she was found to have a left scalp hematoma, and imaging studies confirmed a small extra-axial hematoma along the high left parietal lobe. Neurologically, she has remained stable, but was transferred in case neurosurgical intervention becomes necessary.    Consultants:  NS  nephrology  Procedures:  none  Antibiotics:  none  HPI/Subjective: No new complaints today.  Mentions that her headache is getting better.  Objective: Filed Vitals:   12/30/11 0600 12/30/11 1000 12/30/11 1400 12/30/11 1432  BP: 171/79 171/91 174/106 168/75  Pulse: 77 64 80   Temp: 98.6 F (37 C) 98.5 F (36.9 C) 98.7 F (37.1 C)   TempSrc:  Oral Oral   Resp: 18 18 18    Height:      Weight:      SpO2: 98% 97% 98%     Intake/Output Summary (Last  24 hours) at 12/30/11 1641 Last data filed at 12/30/11 0913  Gross per 24 hour  Intake    123 ml  Output   2804 ml  Net  -2681 ml    Exam:   General:  Alert, no distress  Cardiovascular: RRR, no murmurs  Respiratory: CTA b/l  Abdomen: soft, NT, ND, BS+  Ext: no c/c/e  Data Reviewed: Basic Metabolic Panel:  Lab [id number] 0525 12/28/11 2125 12/28/11 1147  NA 141 138 137  K 4.0 4.1 3.7  CL 102 100 100  CO2 24 25 26   GLUCOSE 86 85 102*  BUN 35* 31* 30*  CREATININE 11.24* 10.37* 9.87*  CALCIUM 7.9* 8.4 8.6  MG -- -- --  PHOS -- 5.6* --   Liver Function Tests:  Lab 12/29/11 0525 12/28/11 2125 12/28/11 1147  AST 16 -- 21  ALT 6 -- 8  ALKPHOS 90 -- 99  BILITOT 0.5 -- 0.5  PROT 8.0 -- 8.9*  ALBUMIN 3.1* 3.1* 3.4*   No results found for this basename: LIPASE:5,AMYLASE:5 in the last 168 hours No results found for this basename: AMMONIA:5 in the last 168 hours CBC:  Lab 12/29/11 0525 12/28/11 1147  WBC 3.0* 2.7*  NEUTROABS -- 1.9  HGB 9.0* 9.7*  HCT 28.9* 30.9*  MCV 100.0 101.0*  PLT 86* 69*   Cardiac Enzymes: No results found for this basename: CKTOTAL:5,CKMB:5,CKMBINDEX:5,TROPONINI:5 in the last 168 hours BNP (last 3 results)  Basename 03/09/11 1503  PROBNP 3673.0*   CBG: No results found for this  basename: GLUCAP:5 in the last 168 hours  Recent Results (from the past 240 hour(s))  MRSA PCR SCREENING     Status: Normal   Collection Time   01/01/12  5:16 PM      Component Value Range Status Comment   MRSA by PCR NEGATIVE  NEGATIVE Final      Studies: Dg Elbow 2 Views Right  Jan 01, 2012  *RADIOLOGY REPORT*  Clinical Data: Fall, pain.  RIGHT ELBOW - 2 VIEW  Comparison: None.  Findings: Imaged bones, joints and soft tissues appear normal.  IMPRESSION: Negative exam.   Original Report Authenticated By: Arvid Right. D'ALESSIO, M.D.    Dg Hip Complete Left  01-01-2012  *RADIOLOGY REPORT*  Clinical Data: Fall, left hip pain.  LEFT HIP - COMPLETE 2+ VIEW   Comparison: None.  Findings: Early degenerative changes in the hips bilaterally.  SI joints are symmetric and unremarkable. No acute bony abnormality. Specifically, no fracture, subluxation, or dislocation.  Soft tissues are intact.  IMPRESSION: No acute bony abnormality.   Original Report Authenticated By: Raelyn Number, M.D.    Ct Head Wo Contrast  12/29/2011  *RADIOLOGY REPORT*  Clinical Data: Subdural hematoma.  CT HEAD WITHOUT CONTRAST  Technique:  Contiguous axial images were obtained from the base of the skull through the vertex without contrast.  Comparison: Head CT scan January 01, 2012.  Findings: Extra-axial hematoma over the high left convexities posteriorly is not markedly changed in thickness measuring approximately 1 cm.  The collection appears slightly less dense. Large overlying scalp hematoma is noted.  No new hemorrhage is seen.  There is no midline shift, hydrocephalus, mass lesion or evidence of acute infarction.  The calvarium is intact.  IMPRESSION: Small extra-axial hemorrhage over the high left convexities is not markedly changed in thickness but appears slightly less dense consistent with some evolutionary change.  Overlying scalp hematoma again noted.  No new abnormality.   Original Report Authenticated By: Arvid Right. Luther Parody, M.D.    Ct Head Wo Contrast  01-01-2012  *RADIOLOGY REPORT*  Clinical Data: Follow-up intracranial hemorrhage  CT HEAD WITHOUT CONTRAST  Technique:  Contiguous axial images were obtained from the base of the skull through the vertex without contrast.  Comparison: CT scan earlier in the day.  Findings: Persistent focal left parietal 10 mm thick collection, biconvex in shape, likely epidural hematoma is redemonstrated and unchanged from priors.  There is no skull fracture.  Left sided scalp hematoma remains.  Mild atrophy is present.  There is chronic microvascular ischemic change.  Cavum septum vergae.  No subarachnoid blood.  No midline shift.  Clear sinuses and  mastoids.  IMPRESSION: Stable left parietal extra-axial collection, likely epidural hematoma.  No significant increase in size.  Stable scalp hematoma.   Original Report Authenticated By: Staci Righter, M.D.    Ct Head Wo Contrast  01-01-12  *RADIOLOGY REPORT*  Clinical Data: Fall with left sided head trauma.  Severe left sided headache.  CT HEAD WITHOUT CONTRAST  Technique:  Contiguous axial images were obtained from the base of the skull through the vertex without contrast.  Comparison: 01/29/2009.  Findings: High attenuation material is seen along the inner table of the high left parietal bone, deep to an associated scalp hematoma.  No significant mass effect.  No evidence of acute infarct, mass lesion or hydrocephalus.  There is periventricular low attenuation.  No associated fracture.  No air fluid levels in the visualized portions of the paranasal sinuses.  IMPRESSION:  1.  Small  extra-axial hematoma along the high left parietal lobe, with overlying scalp hematoma.  No associated fracture. Critical Value/emergent results were called by telephone at the time of interpretation on 12/28/2011 at 1138 hours to Dr. Rogene Houston, who verbally acknowledged these results. 2.  Chronic microvascular white matter ischemic changes.   Original Report Authenticated By: Luretha Rued, M.D.    Ct Cervical Spine Wo Contrast  12/28/2011  *RADIOLOGY REPORT*  Clinical Data: [place] today, severe headaches.  CT CERVICAL SPINE WITHOUT CONTRAST  Technique:  Multidetector CT imaging of the cervical spine was performed. Multiplanar CT image reconstructions were also generated.  Comparison: CT neck from 08/24/2002.  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  There is severe facet arthropathy at C3-4 and C4-5 on the left. Moderate facet arthropathy is noted at C2-3 on the left.  There is 2 mm of facet mediated slip C4-C5.  No traumatic subluxation is seen.  There is no  cervical spine fracture.  No prevertebral soft tissue swelling.  Lung apices are clear.  Trachea midline.  Fairly well circumscribed lesions are noted in the right division of C2 and T1. These measure roughly 7 mm in cross section C2 and 5 mm cross-section at T1.  These are likely related to other smaller lesions noted posterior elements consistent with benign bone cysts. A similar lesion can be seen in T1 from 2004.  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  Advanced left-sided facet arthropathy.  No cervical spine fracture or traumatic subluxation.  Multiple bones cysts as described.   Original Report Authenticated By: Staci Righter, M.D.    Dg Chest Port 1 View  12/28/2011  *RADIOLOGY REPORT*  Clinical Data: Fall, hypertension.  PORTABLE CHEST - 1 VIEW  Comparison: 09/20/2011.  Chest CT 01/22/2009.  Findings: Bilateral perihilar fullness again noted. When compared to prior CT from 2010, this is likely stable and related to prominent pulmonary arteries, likely pulmonary arterial hypertension.  No change since prior study.  Mild diffuse interstitial prominence and cardiomegaly.  No effusions.  No acute bony abnormality.  IMPRESSION: Stable bilateral perihilar prominence.  Interstitial prominence could reflect interstitial edema.   Original Report Authenticated By: Raelyn Number, M.D.     Scheduled Meds:    . amLODipine  10 mg Oral QHS  . cinacalcet  30 mg Oral Q supper  . darbepoetin (ARANESP) injection - DIALYSIS  100 mcg Intravenous Q Tue-HD  . feeding supplement  30 mL Oral BID WC  . fentaNYL  25 mcg Transdermal Q48H  . heparin  40 Units/kg Dialysis Once in dialysis  . lamiVUDine  50 mg Oral Daily  . lanthanum  1,000 mg Oral TID WC  . lidocaine  1 patch Transdermal Q24H  . multivitamin  1 tablet Oral Daily  . oxyCODONE      . pantoprazole  40 mg Oral BID  . polyvinyl alcohol  1 drop Both Eyes BID  . raltegravir  400 mg Oral BID  . sildenafil  20 mg Oral TID    . sodium chloride  3 mL Intravenous Q12H  . zidovudine  100 mg Oral TID   Continuous Infusions:   ________________________________________________________________________  Time spent: 20 min    Daquisha Clermont, [place] [facility] Pager [phone] If 8PM-8AM, please contact night-coverage at [email or web address], password [facility] 12/30/2011, 4:41 PM  LOS: 2 days

## 2011-12-30 NOTE — Progress Notes (Signed)
Subjective: Interval History: has complaints sick last night.  Objective: Vital signs in last 24 hours: Temp:  [97.8 F (36.6 C)-98.6 F (37 C)] 98.5 F (36.9 C) (10/30 1000) Pulse Rate:  [59-80] 64  (10/30 1000) Resp:  [12-22] 18  (10/30 1000) BP: (136-191)/(69-91) 171/91 mmHg (10/30 1000) SpO2:  [94 %-100 %] 97 % (10/30 1000) FiO2 (%):  [2 %] 2 % (10/29 1830) Weight:  [52.6 kg (115 lb 15.4 oz)] 52.6 kg (115 lb 15.4 oz) (10/29 1830) Weight change: 1.7 kg (3 lb 12 oz)  Intake/Output from previous day: 10/29 0701 - 10/30 0700 In: 543 [P.O.:540; I.V.:3] Out: 2804  Intake/Output this shift: Total I/O In: 3 [I.V.:3] Out: -   General appearance: cooperative and slowed mentation Resp: diminished breath sounds bilaterally Cardio: S1, S2 normal and systolic murmur: holosystolic 2/6, blowing at apex GI: pos bs,soft, liver down 5 cm Extremities: AVF RFA, B&T  Lab Results:  [facility] 12/29/11 0525 12/28/11 1147  WBC 3.0* 2.7*  HGB 9.0* 9.7*  HCT 28.9* 30.9*  PLT 86* 69*   BMET:  Basename 12/29/11 0525 12/28/11 2125  NA 141 138  K 4.0 4.1  CL 102 100  CO2 24 25  GLUCOSE 86 85  BUN 35* 31*  CREATININE 11.24* 10.37*  CALCIUM 7.9* 8.4   No results found for this basename: PTH:2 in the last 72 hours Iron Studies: No results found for this basename: IRON,TIBC,TRANSFERRIN,FERRITIN in the last 72 hours  Studies/Results: Dg Elbow 2 Views Right  12/28/2011  *RADIOLOGY REPORT*  Clinical Data: Fall, pain.  RIGHT ELBOW - 2 VIEW  Comparison: None.  Findings: Imaged bones, joints and soft tissues appear normal.  IMPRESSION: Negative exam.   Original Report Authenticated By: Arvid Right. D'ALESSIO, M.D.    Dg Wrist Complete Right  12/29/2011  *RADIOLOGY REPORT*  Clinical Data: Swelling and pain post fall.  RIGHT WRIST - COMPLETE 3+ VIEW  Comparison: None.  Findings: Diffuse osteopenia.  Carpal rows intact.  Negative for fracture, dislocation, or other acute abnormality.  Normal  alignment  . No significant degenerative change.  Regional soft tissues unremarkable.  IMPRESSION:  Osteopenia without fracture or other acute abnormality   Original Report Authenticated By: Trecia Rogers, M.D.    Ct Head Wo Contrast  12/29/2011  *RADIOLOGY REPORT*  Clinical Data: Subdural hematoma.  CT HEAD WITHOUT CONTRAST  Technique:  Contiguous axial images were obtained from the base of the skull through the vertex without contrast.  Comparison: Head CT scan 12/28/2011.  Findings: Extra-axial hematoma over the high left convexities posteriorly is not markedly changed in thickness measuring approximately 1 cm.  The collection appears slightly less dense. Large overlying scalp hematoma is noted.  No new hemorrhage is seen.  There is no midline shift, hydrocephalus, mass lesion or evidence of acute infarction.  The calvarium is intact.  IMPRESSION: Small extra-axial hemorrhage over the high left convexities is not markedly changed in thickness but appears slightly less dense consistent with some evolutionary change.  Overlying scalp hematoma again noted.  No new abnormality.   Original Report Authenticated By: Arvid Right. Luther Parody, M.D.    Ct Head Wo Contrast  12/28/2011  *RADIOLOGY REPORT*  Clinical Data: Follow-up intracranial hemorrhage  CT HEAD WITHOUT CONTRAST  Technique:  Contiguous axial images were obtained from the base of the skull through the vertex without contrast.  Comparison: CT scan earlier in the day.  Findings: Persistent focal left parietal 10 mm thick collection, biconvex in shape, likely epidural  hematoma is redemonstrated and unchanged from priors.  There is no skull fracture.  Left sided scalp hematoma remains.  Mild atrophy is present.  There is chronic microvascular ischemic change.  Cavum septum vergae.  No subarachnoid blood.  No midline shift.  Clear sinuses and mastoids.  IMPRESSION: Stable left parietal extra-axial collection, likely epidural hematoma.  No significant  increase in size.  Stable scalp hematoma.   Original Report Authenticated By: Staci Righter, M.D.    Ct Cervical Spine Wo Contrast  12/28/2011  *RADIOLOGY REPORT*  Clinical Data: [place] today, severe headaches.  CT CERVICAL SPINE WITHOUT CONTRAST  Technique:  Multidetector CT imaging of the cervical spine was performed. Multiplanar CT image reconstructions were also generated.  Comparison: CT neck from 08/24/2002.  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  There is severe facet arthropathy at C3-4 and C4-5 on the left. Moderate facet arthropathy is noted at C2-3 on the left.  There is 2 mm of facet mediated slip C4-C5.  No traumatic subluxation is seen.  There is no cervical spine fracture.  No prevertebral soft tissue swelling.  Lung apices are clear.  Trachea midline.  Fairly well circumscribed lesions are noted in the right division of C2 and T1. These measure roughly 7 mm in cross section C2 and 5 mm cross-section at T1.  These are likely related to other smaller lesions noted posterior elements consistent with benign bone cysts. A similar lesion can be seen in T1 from 2004.  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  Advanced left-sided facet arthropathy.  No cervical spine fracture or traumatic subluxation.  Multiple bones cysts as described.   Original Report Authenticated By: Staci Righter, M.D.    Dg Chest Port 1 View  12/28/2011  *RADIOLOGY REPORT*  Clinical Data: Fall, hypertension.  PORTABLE CHEST - 1 VIEW  Comparison: 09/20/2011.  Chest CT 01/22/2009.  Findings: Bilateral perihilar fullness again noted. When compared to prior CT from 2010, this is likely stable and related to prominent pulmonary arteries, likely pulmonary arterial hypertension.  No change since prior study.  Mild diffuse interstitial prominence and cardiomegaly.  No effusions.  No acute bony abnormality.  IMPRESSION: Stable bilateral perihilar  prominence.  Interstitial prominence could reflect interstitial edema.   Original Report Authenticated By: Raelyn Number, M.D.     I have reviewed the patient's current medications.  Assessment/Plan: 1 CRF for HD in am.  bp ^, so lower vol 2 HTN as above 3 HIV 4 Epidural bleed per Neuro 5 Anemia epo 5 HPTH cinnacalcet P as above, HD, meds   LOS: 2 days   Niemah Schwebke L 12/30/2011,11:33 AM

## 2011-12-30 NOTE — Clinical Social Work Psychosocial (Signed)
     Clinical Social Work Department BRIEF PSYCHOSOCIAL ASSESSMENT 12/30/2011  Patient:  [name], [name]     Account Number:  [id number]     Admit date:  12/28/2011  Clinical Social Worker:  Lehman Prom  Date/Time:  12/30/2011 11:30 AM  Referred by:  Physician  Date Referred:  12/30/2011 Referred for  Other - See comment   Other Referral:   Pt was admitted from facility   Interview type:  Patient Other interview type:    PSYCHOSOCIAL DATA Living Status:  FACILITY Admitted from facility:  [facility] Level of care:  [place] Primary support name:  Kieth Brightly [place]/Friend Primary support relationship to patient:  FRIEND Degree of support available:   Supportive    CURRENT CONCERNS Current Concerns  Post-Acute Placement   Other Concerns:    SOCIAL WORK ASSESSMENT / PLAN CSW met with pt to address consult. CSW introduced herself and explained role of social work.    Pt is a resident of [facility]. Pt is agreeable to returning at discharge. Pt declined to have her friend contacted at this time, however would like her contacted day of discharge.    CSW contacted [facility] regarding pt's return. CSW will complete FL2 and send it on to the facility. CSW will continue to follow.   Assessment/plan status:  Psychosocial Support/Ongoing Assessment of Needs Other assessment/ plan:   Information/referral to community resources:   Pt is a resident of [facility]: Pt was alert and oriented. Pt is agreeable to discharge.

## 2011-12-31 ENCOUNTER — Inpatient Hospital Stay (HOSPITAL_COMMUNITY): Payer: Medicare Other

## 2011-12-31 DIAGNOSIS — R51 Headache: Secondary | ICD-10-CM | POA: Diagnosis not present

## 2011-12-31 DIAGNOSIS — Z21 Asymptomatic human immunodeficiency virus [HIV] infection status: Secondary | ICD-10-CM | POA: Diagnosis not present

## 2011-12-31 DIAGNOSIS — D631 Anemia in chronic kidney disease: Secondary | ICD-10-CM | POA: Diagnosis not present

## 2011-12-31 DIAGNOSIS — B20 Human immunodeficiency virus [HIV] disease: Secondary | ICD-10-CM

## 2011-12-31 DIAGNOSIS — N186 End stage renal disease: Secondary | ICD-10-CM | POA: Diagnosis not present

## 2011-12-31 DIAGNOSIS — K219 Gastro-esophageal reflux disease without esophagitis: Secondary | ICD-10-CM | POA: Diagnosis not present

## 2011-12-31 DIAGNOSIS — S0990XA Unspecified injury of head, initial encounter: Secondary | ICD-10-CM | POA: Diagnosis not present

## 2011-12-31 DIAGNOSIS — I120 Hypertensive chronic kidney disease with stage 5 chronic kidney disease or end stage renal disease: Secondary | ICD-10-CM | POA: Diagnosis not present

## 2011-12-31 LAB — CBC
HCT: 31 % — ABNORMAL LOW (ref 36.0–46.0)
HCT: 31.2 % — ABNORMAL LOW (ref 36.0–46.0)
Hemoglobin: 9.8 g/dL — ABNORMAL LOW (ref 12.0–15.0)
Hemoglobin: 9.6 g/dL — ABNORMAL LOW (ref 12.0–15.0)
MCH: 31.7 pg (ref 26.0–34.0)
MCH: 30.8 pg (ref 26.0–34.0)
MCHC: 30.8 g/dL (ref 30.0–36.0)
MCV: 100.3 fL — ABNORMAL HIGH (ref 78.0–100.0)
MCV: 100 fL (ref 78.0–100.0)
Platelets: 94 10*3/uL — ABNORMAL LOW (ref 150–400)
RBC: 3.09 MIL/uL — ABNORMAL LOW (ref 3.87–5.11)
WBC: 3.1 10*3/uL — ABNORMAL LOW (ref 4.0–10.5)

## 2011-12-31 LAB — RENAL FUNCTION PANEL
BUN: 26 mg/dL — ABNORMAL HIGH (ref 6–23)
Creatinine, Ser: 8.27 mg/dL — ABNORMAL HIGH (ref 0.50–1.10)
Glucose, Bld: 79 mg/dL (ref 70–99)
Phosphorus: 4.4 mg/dL (ref 2.3–4.6)
Potassium: 3.8 mEq/L (ref 3.5–5.1)

## 2011-12-31 MED ORDER — OXYCODONE HCL 5 MG PO TABS
ORAL_TABLET | ORAL | Status: AC
  Administered 2011-12-31: 5 mg via ORAL
  Filled 2011-12-31: qty 1

## 2011-12-31 NOTE — Procedures (Signed)
I was present at this session.  I have reviewed the session itself and made appropriate changes.  bp ^ lower vol.  Access ok  Menachem Urbanek L 10/31/20139:21 AM

## 2011-12-31 NOTE — Progress Notes (Signed)
Subjective: Interval History: has complaints no appetite.  Objective: Vital signs in last 24 hours: Temp:  [98 F (36.7 C)-98.9 F (37.2 C)] 98.8 F (37.1 C) (10/31 0722) Pulse Rate:  [62-92] 68  (10/31 0915) Resp:  [16-19] 16  (10/31 0915) BP: (147-216)/(75-106) 155/83 mmHg (10/31 0915) SpO2:  [97 %-100 %] 98 % (10/31 0722) Weight:  [52.1 kg (114 lb 13.8 oz)] 52.1 kg (114 lb 13.8 oz) (10/31 0722) Weight change:   Intake/Output from previous day: 10/30 0701 - 10/31 0700 In: 3 [I.V.:3] Out: -  Intake/Output this shift:    General appearance: alert and cooperative Resp: clear to auscultation bilaterally Cardio: S1, S2 normal and systolic murmur: holosystolic 2/6, blowing at apex GI: pos bs, soft, liver down 5 cm Extremities: avf RLA  Lab Results:  [facility] 12/31/11 0743 12/31/11 0450  WBC 3.2* 3.1*  HGB 9.6* 9.8*  HCT 31.2* 31.0*  PLT 86* 94*   BMET:  Basename 12/31/11 0740 12/29/11 0525  NA 140 141  K 3.8 4.0  CL 100 102  CO2 29 24  GLUCOSE 79 86  BUN 26* 35*  CREATININE 8.27* 11.24*  CALCIUM 8.4 7.9*   No results found for this basename: PTH:2 in the last 72 hours Iron Studies: No results found for this basename: IRON,TIBC,TRANSFERRIN,FERRITIN in the last 72 hours  Studies/Results: Dg Wrist Complete Right  12/29/2011  *RADIOLOGY REPORT*  Clinical Data: Swelling and pain post fall.  RIGHT WRIST - COMPLETE 3+ VIEW  Comparison: None.  Findings: Diffuse osteopenia.  Carpal rows intact.  Negative for fracture, dislocation, or other acute abnormality.  Normal alignment  . No significant degenerative change.  Regional soft tissues unremarkable.  IMPRESSION:  Osteopenia without fracture or other acute abnormality   Original Report Authenticated By: Trecia Rogers, M.D.     I have reviewed the patient's current medications.  Assessment/Plan: 1 CRF for HD.  BP ^, lower vol with low UF 2 HIV 3 Epidural hematoma per ns 4 Anemia stable on meds 5 HTN meds and  vol lower P HD, epo, bp meds    LOS: 3 days   Talishia Betzler L 12/31/2011,9:24 AM

## 2011-12-31 NOTE — Progress Notes (Signed)
[facility] PROGRESS NOTE  LACREASHA JUMA [id number] DOB: [birth year] DOA: 12/28/2011 PCP: Cyndee Brightly, MD  Assessment/Plan: Principal Problem:  *Intracranial bleed Due to trauma- bleed is stable. NS on board and making recommendations although patient last seen by NS on 12/29/11 and at that time they mention that CT stable and patient able to be discharged once medically stable. - D/C back to SNF once patient medically stable.  Placed order to social work for preparation for placement back to SNF  Right wrist swelling F/u xray showed osteopenia without fracture or other acute abnormality.   Continue orthotic splint   HUMAN IMMUNODEFICIENCY VIRUS [HIV] Continue home medications   End stage renal disease Dialysis per nephrology.  HTN Cont current medications - Was elevated this morning and as such decided to observe for another day as blood pressures should be better controlled 2ary to # 1.    Anemia - Decrease in hemoglobin level from 9.7 to 9.0 on 10/30. - Will monitor overnight and recheck level next am.  Blood pressure has ranged from 216/104 to 131/74 after dialysis - Pending blood pressures will adjust patient's regimen.   Code Status: Full code Disposition Plan: transfer out of SDU DVT prophylaxis: SCDs   Brief narrative: 61 y.o. female transferred from [facility] on 12/28/11, by Dr Hurshel Party. NH resident, with known history of HIV disease on HAART, HTN, depression, ESRD on HD T-T-S, chronic diarrhea, previous C. Difficile colitis, admitted to [facility] following a fall in the nursing home today. She was trying to escape from a spider on the floor. She hit the left side of her head. She did not lose consciousness. On evaluation in the ED, she was found to have a left scalp hematoma, and imaging studies confirmed a small extra-axial hematoma along the high left parietal lobe. Neurologically, she has remained stable, but was transferred in case neurosurgical  intervention becomes necessary.    Consultants:  NS  nephrology  Procedures:  none  Antibiotics:  none  HPI/Subjective: No new complaints today.  Mentions that her headache is getting better.  Objective: Filed Vitals:   12/31/11 1016 12/31/11 1030 12/31/11 1106 12/31/11 1333  BP: 139/72 137/81 139/73 131/74  Pulse: 70 69 72 73  Temp:   98 F (36.7 C) 98.4 F (36.9 C)  TempSrc:   Oral Oral  Resp: 16 16 16 18   Height:      Weight:   49.9 kg (110 lb 0.2 oz)   SpO2:   96% 96%    Intake/Output Summary (Last 24 hours) at 12/31/11 1533 Last data filed at 12/31/11 1300  Gross per 24 hour  Intake      3 ml  Output   2245 ml  Net  -2242 ml    Exam:   General:  Alert, no distress  Cardiovascular: RRR, no murmurs  Respiratory: CTA b/l  Abdomen: soft, NT, ND, BS+  Ext: no c/c/e  Data Reviewed: Basic Metabolic Panel:  Lab [id number] 0740 12/29/11 0525 12/28/11 2125 12/28/11 1147  NA 140 141 138 137  K 3.8 4.0 4.1 3.7  CL 100 102 100 100  CO2 29 24 25 26   GLUCOSE 79 86 85 102*  BUN 26* 35* 31* 30*  CREATININE 8.27* 11.24* 10.37* 9.87*  CALCIUM 8.4 7.9* 8.4 8.6  MG -- -- -- --  PHOS 4.4 -- 5.6* --   Liver Function Tests:  Lab 12/31/11 0740 12/29/11 0525 12/28/11 2125 12/28/11 1147  AST -- 16 -- 21  ALT --  6 -- 8  ALKPHOS -- 90 -- 99  BILITOT -- 0.5 -- 0.5  PROT -- 8.0 -- 8.9*  ALBUMIN 3.1* 3.1* 3.1* 3.4*   No results found for this basename: LIPASE:5,AMYLASE:5 in the last 168 hours No results found for this basename: AMMONIA:5 in the last 168 hours CBC:  Lab 12/31/11 0743 12/31/11 0450 12/29/11 0525 Jan 14, 2012 1147  WBC 3.2* 3.1* 3.0* 2.7*  NEUTROABS -- -- -- 1.9  HGB 9.6* 9.8* 9.0* 9.7*  HCT 31.2* 31.0* 28.9* 30.9*  MCV 100.0 100.3* 100.0 101.0*  PLT 86* 94* 86* 69*   Cardiac Enzymes: No results found for this basename: CKTOTAL:5,CKMB:5,CKMBINDEX:5,TROPONINI:5 in the last 168 hours BNP (last 3 results)  Basename 03/09/11 1503  PROBNP  3673.0*   CBG: No results found for this basename: GLUCAP:5 in the last 168 hours  Recent Results (from the past 240 hour(s))  MRSA PCR SCREENING     Status: Normal   Collection Time   2012/01/14  5:16 PM      Component Value Range Status Comment   MRSA by PCR NEGATIVE  NEGATIVE Final      Studies: Dg Elbow 2 Views Right  14-Jan-2012  *RADIOLOGY REPORT*  Clinical Data: Fall, pain.  RIGHT ELBOW - 2 VIEW  Comparison: None.  Findings: Imaged bones, joints and soft tissues appear normal.  IMPRESSION: Negative exam.   Original Report Authenticated By: Arvid Right. D'ALESSIO, M.D.    Dg Hip Complete Left  01/14/2012  *RADIOLOGY REPORT*  Clinical Data: Fall, left hip pain.  LEFT HIP - COMPLETE 2+ VIEW  Comparison: None.  Findings: Early degenerative changes in the hips bilaterally.  SI joints are symmetric and unremarkable. No acute bony abnormality. Specifically, no fracture, subluxation, or dislocation.  Soft tissues are intact.  IMPRESSION: No acute bony abnormality.   Original Report Authenticated By: Raelyn Number, M.D.    Ct Head Wo Contrast  12/29/2011  *RADIOLOGY REPORT*  Clinical Data: Subdural hematoma.  CT HEAD WITHOUT CONTRAST  Technique:  Contiguous axial images were obtained from the base of the skull through the vertex without contrast.  Comparison: Head CT scan January 14, 2012.  Findings: Extra-axial hematoma over the high left convexities posteriorly is not markedly changed in thickness measuring approximately 1 cm.  The collection appears slightly less dense. Large overlying scalp hematoma is noted.  No new hemorrhage is seen.  There is no midline shift, hydrocephalus, mass lesion or evidence of acute infarction.  The calvarium is intact.  IMPRESSION: Small extra-axial hemorrhage over the high left convexities is not markedly changed in thickness but appears slightly less dense consistent with some evolutionary change.  Overlying scalp hematoma again noted.  No new abnormality.   Original  Report Authenticated By: Arvid Right. Luther Parody, M.D.    Ct Head Wo Contrast  01/14/12  *RADIOLOGY REPORT*  Clinical Data: Follow-up intracranial hemorrhage  CT HEAD WITHOUT CONTRAST  Technique:  Contiguous axial images were obtained from the base of the skull through the vertex without contrast.  Comparison: CT scan earlier in the day.  Findings: Persistent focal left parietal 10 mm thick collection, biconvex in shape, likely epidural hematoma is redemonstrated and unchanged from priors.  There is no skull fracture.  Left sided scalp hematoma remains.  Mild atrophy is present.  There is chronic microvascular ischemic change.  Cavum septum vergae.  No subarachnoid blood.  No midline shift.  Clear sinuses and mastoids.  IMPRESSION: Stable left parietal extra-axial collection, likely epidural hematoma.  No significant increase in  size.  Stable scalp hematoma.   Original Report Authenticated By: Staci Righter, M.D.    Ct Head Wo Contrast  12/28/2011  *RADIOLOGY REPORT*  Clinical Data: Fall with left sided head trauma.  Severe left sided headache.  CT HEAD WITHOUT CONTRAST  Technique:  Contiguous axial images were obtained from the base of the skull through the vertex without contrast.  Comparison: 01/29/2009.  Findings: High attenuation material is seen along the inner table of the high left parietal bone, deep to an associated scalp hematoma.  No significant mass effect.  No evidence of acute infarct, mass lesion or hydrocephalus.  There is periventricular low attenuation.  No associated fracture.  No air fluid levels in the visualized portions of the paranasal sinuses.  IMPRESSION:  1.  Small extra-axial hematoma along the high left parietal lobe, with overlying scalp hematoma.  No associated fracture. Critical Value/emergent results were called by telephone at the time of interpretation on 12/28/2011 at 1138 hours to Dr. Rogene Houston, who verbally acknowledged these results. 2.  Chronic microvascular white  matter ischemic changes.   Original Report Authenticated By: Luretha Rued, M.D.    Ct Cervical Spine Wo Contrast  12/28/2011  *RADIOLOGY REPORT*  Clinical Data: [place] today, severe headaches.  CT CERVICAL SPINE WITHOUT CONTRAST  Technique:  Multidetector CT imaging of the cervical spine was performed. Multiplanar CT image reconstructions were also generated.  Comparison: CT neck from 08/24/2002.  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  There is severe facet arthropathy at C3-4 and C4-5 on the left. Moderate facet arthropathy is noted at C2-3 on the left.  There is 2 mm of facet mediated slip C4-C5.  No traumatic subluxation is seen.  There is no cervical spine fracture.  No prevertebral soft tissue swelling.  Lung apices are clear.  Trachea midline.  Fairly well circumscribed lesions are noted in the right division of C2 and T1. These measure roughly 7 mm in cross section C2 and 5 mm cross-section at T1.  These are likely related to other smaller lesions noted posterior elements consistent with benign bone cysts. A similar lesion can be seen in T1 from 2004.  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  Advanced left-sided facet arthropathy.  No cervical spine fracture or traumatic subluxation.  Multiple bones cysts as described.   Original Report Authenticated By: Staci Righter, M.D.    Dg Chest Port 1 View  12/28/2011  *RADIOLOGY REPORT*  Clinical Data: Fall, hypertension.  PORTABLE CHEST - 1 VIEW  Comparison: 09/20/2011.  Chest CT 01/22/2009.  Findings: Bilateral perihilar fullness again noted. When compared to prior CT from 2010, this is likely stable and related to prominent pulmonary arteries, likely pulmonary arterial hypertension.  No change since prior study.  Mild diffuse interstitial prominence and cardiomegaly.  No effusions.  No acute bony abnormality.  IMPRESSION: Stable bilateral perihilar prominence.   Interstitial prominence could reflect interstitial edema.   Original Report Authenticated By: Raelyn Number, M.D.     Scheduled Meds:    . amLODipine  10 mg Oral QHS  . cinacalcet  30 mg Oral Q supper  . darbepoetin (ARANESP) injection - DIALYSIS  100 mcg Intravenous Q Tue-HD  . feeding supplement  30 mL Oral BID WC  . fentaNYL  25 mcg Transdermal Q48H  . heparin  40 Units/kg Dialysis Once in dialysis  . lamiVUDine  50 mg Oral Daily  . lanthanum  1,000 mg Oral TID WC  . lidocaine  1 patch Transdermal Q24H  . multivitamin  1 tablet Oral Daily  . pantoprazole  40 mg Oral BID  . polyvinyl alcohol  1 drop Both Eyes BID  . raltegravir  400 mg Oral BID  . sildenafil  20 mg Oral TID  . sodium chloride  3 mL Intravenous Q12H  . zidovudine  100 mg Oral TID   Continuous Infusions:   ________________________________________________________________________  Time spent: 30 min    Stephany Poorman, [facility] Pager [phone] If 8PM-8AM, please contact night-coverage at [email or web address], password [facility] 12/31/2011, 3:33 PM  LOS: 3 days

## 2011-12-31 NOTE — Clinical Social Work Note (Signed)
Clinical Social Work  CSW received second consult regarding dispo. CSW contacted facility regarding potential discharge on Friday, 01/01/2012. CSW confirmed with facility that they will be able to accept pt at discharge. CSW to facilitate discharge to [facility] on Fri, 01/01/2012.  Darden Dates, MSW, [place]

## 2011-12-31 NOTE — Plan of Care (Signed)
Problem: Phase I Progression Outcomes Goal: Voiding-avoid urinary catheter unless indicated Outcome: Not Progressing Pt is anuric

## 2012-01-01 DIAGNOSIS — I120 Hypertensive chronic kidney disease with stage 5 chronic kidney disease or end stage renal disease: Secondary | ICD-10-CM | POA: Diagnosis not present

## 2012-01-01 DIAGNOSIS — N186 End stage renal disease: Secondary | ICD-10-CM | POA: Diagnosis not present

## 2012-01-01 DIAGNOSIS — I629 Nontraumatic intracranial hemorrhage, unspecified: Secondary | ICD-10-CM | POA: Diagnosis not present

## 2012-01-01 DIAGNOSIS — K219 Gastro-esophageal reflux disease without esophagitis: Secondary | ICD-10-CM | POA: Diagnosis not present

## 2012-01-01 DIAGNOSIS — S0990XA Unspecified injury of head, initial encounter: Secondary | ICD-10-CM | POA: Diagnosis not present

## 2012-01-01 DIAGNOSIS — Z862 Personal history of diseases of the blood and blood-forming organs and certain disorders involving the immune mechanism: Secondary | ICD-10-CM

## 2012-01-01 DIAGNOSIS — N039 Chronic nephritic syndrome with unspecified morphologic changes: Secondary | ICD-10-CM | POA: Diagnosis not present

## 2012-01-01 DIAGNOSIS — D649 Anemia, unspecified: Secondary | ICD-10-CM | POA: Diagnosis not present

## 2012-01-01 DIAGNOSIS — R58 Hemorrhage, not elsewhere classified: Secondary | ICD-10-CM | POA: Diagnosis not present

## 2012-01-01 MED ORDER — AMLODIPINE BESYLATE 10 MG PO TABS: 10.0000 mg | ORAL_TABLET | Freq: Every day | ORAL | Status: DC

## 2012-01-01 MED ORDER — OXYCODONE HCL 5 MG PO TABS: 5.0000 mg | ORAL_TABLET | Freq: Every day | ORAL | Status: DC

## 2012-01-01 MED ORDER — PRO-STAT SUGAR FREE PO LIQD: 30.0000 mL | Freq: Two times a day (BID) | ORAL | Status: DC

## 2012-01-01 MED ORDER — LORAZEPAM 0.5 MG PO TABS: 0.1250 mg | ORAL_TABLET | Freq: Four times a day (QID) | ORAL | Status: DC | PRN

## 2012-01-01 NOTE — Progress Notes (Signed)
Subjective: Interval History: none.  Objective: Vital signs in last 24 hours: Temp:  [98.3 F (36.8 C)-98.6 F (37 C)] 98.6 F (37 C) (11/01 0612) Pulse Rate:  [73-87] 76  (11/01 0612) Resp:  [18] 18  (11/01 0612) BP: (118-142)/(71-87) 118/75 mmHg (11/01 0612) SpO2:  [96 %-99 %] 97 % (11/01 0612) Weight change:   Intake/Output from previous day: 10/31 0701 - 11/01 0700 In: 123 [P.O.:120; I.V.:3] Out: 2245  Intake/Output this shift:    General appearance: alert, cooperative and mildly obese Resp: diminished breath sounds bilaterally Cardio: regular rate and rhythm and systolic murmur: holosystolic 2/6, blowing at apex GI: pos bs, soft, liver down 6 cm Extremities: AVF R FA B&T  Lab Results:  Basename 12/31/11 0743 12/31/11 0450  WBC 3.2* 3.1*  HGB 9.6* 9.8*  HCT 31.2* 31.0*  PLT 86* 94*   BMET:  Basename 12/31/11 0740  NA 140  K 3.8  CL 100  CO2 29  GLUCOSE 79  BUN 26*  CREATININE 8.27*  CALCIUM 8.4   No results found for this basename: PTH:2 in the last 72 hours Iron Studies: No results found for this basename: IRON,TIBC,TRANSFERRIN,FERRITIN in the last 72 hours  Studies/Results: No results found.  I have reviewed the patient's current medications.  Assessment/Plan: 1 CRF stable for D/C go to her outpatient unit tomorrow.  Call with wgts. 2 HTN better 3 Epidural hematoma 4 HIV 5 Anemia stable P HD outpatient, d/c call center    LOS: 4 days   [name] L 01/01/2012,11:17 AM

## 2012-01-01 NOTE — Discharge Summary (Signed)
Physician Discharge Summary  [name] [id number] DOB: 12/20/1950 DOA: 12/28/2011  PCP: Cyndee Brightly, MD  Admit date: 12/28/2011 Discharge date: 01/01/2012  Time spent: > 30 minutes  Recommendations for Outpatient Follow-up:  1. F/u H/H 2. Pt to continue hemodialysis 3. Reevaluate wrist as outpatient.  Discharge Diagnoses:  Principal Problem:  *Intracranial bleed Active Problems:  HUMAN IMMUNODEFICIENCY VIRUS [HIV]  ANEMIA, NORMOCYTIC, CHRONIC  End stage renal disease   Discharge Condition: stable  Diet recommendation: Renal diet  Filed Weights   12/29/11 1830 12/31/11 0722 12/31/11 1106  Weight: 52.6 kg (115 lb 15.4 oz) 52.1 kg (114 lb 13.8 oz) 49.9 kg (110 lb 0.2 oz)    History of present illness:  From original HPI: [name] is a 61 y.o. female presents with the above symptoms, having had a fall in the nursing home today. She was trying to escape from a spider on the floor. She somehow managed to fall and she was time to keep away from the floor and she seems to have tripped over a chair nearby. She hit the left side of her head. She did not lose consciousness. When she presented to the emergency room, she was found to have a small extra-axial hematoma along the high left parietal lobe, with overlying scalp hematoma. Her neurological status has not deteriorated. This lady has end-stage renal disease and receives hemodialysis 3 times a week. She also has HIV disease and is on antiretroviral therapy.  Hospital Course:   *Intracranial bleed  Due to trauma- bleed is stable. NS on board and making recommendations although patient last seen by NS on 12/29/11 and at that time they mention that CT stable and patient able to be discharged once medically stable.  - D/C back to SNF once patient medically stable.   Right wrist swelling  Patient had fall and subsequently developed right wrist discomfort. F/u xray showed osteopenia without fracture or other  acute abnormality.  Continue orthotic splint.  HUMAN IMMUNODEFICIENCY VIRUS [HIV]  Continue home medications and routine f/u with infectious disease doctor.  End stage renal disease  Dialysis per nephrology. While in house dialysis was continued.    HTN  Cont current medications, well controlled on last check.  BP 118/75  Anemia  - Hemoglobin steady at 9.6 on day of d/c. Likely related to CKD.  Procedures:  Hemodialysis  CT of head on 12/28/11  Consultations:  Neurosurgeon  Discharge Exam: Filed Vitals:   12/31/11 1800 12/31/11 2155 01/01/12 0159 01/01/12 0612  BP: 137/74 142/87 135/71 118/75  Pulse: 80 77 87 76  Temp: 98.5 F (36.9 C) 98.3 F (36.8 C) 98.6 F (37 C) 98.6 F (37 C)  TempSrc: Oral Oral Oral Oral  Resp: 18 18 18 18   Height:      Weight:      SpO2: 97% 99% 97% 97%    General: Pt in NAD, Alert and Awake Cardiovascular: RRR, No MRG Respiratory: CTA BL, no wheezes Abdomen: soft, NT  Discharge Instructions  Discharge Orders    Future Orders Please Complete By Expires   Diet - low sodium heart healthy      Increase activity slowly      Discharge instructions      Comments:   Please be sure to follow up with PCP in 1-2 weeks or sooner.  Also should f/u with neurosurgeon in 2-3 weeks or sooner should any new concerns arise.   Call MD for:  temperature >100.4  Call MD for:  redness, tenderness, or signs of infection (pain, swelling, redness, odor or green/yellow discharge around incision site)      Call MD for:  difficulty breathing, headache or visual disturbances          Medication List     As of 01/01/2012 10:07 AM    TAKE these medications         aluminum & magnesium hydroxide-simethicone 500-450-40 MG/5ML suspension   Commonly known as: MYLANTA   Take 30 mLs by mouth every 6 (six) hours as needed. Indigestion      amLODipine 10 MG tablet   Commonly known as: NORVASC   Take 1 tablet (10 mg total) by mouth at bedtime.      b  complex-vitamin c-folic acid 0.8 MG Tabs   Take 0.8 mg by mouth at bedtime.      BIOFREEZE EX   Apply 1 application topically 4 (four) times daily as needed. For pain  **do not apply near dialysis site**      cinacalcet 30 MG tablet   Commonly known as: SENSIPAR   Take 30 mg by mouth daily with supper.      feeding supplement Liqd   Take 30 mLs by mouth 2 (two) times daily with a meal.      fentaNYL 25 MCG/HR   Commonly known as: DURAGESIC - dosed mcg/hr   Place 1 patch onto the skin every other day. Rotate sites      hydroxypropyl methylcellulose 2.5 % ophthalmic solution   Commonly known as: ISOPTO TEARS   Place 1 drop into both eyes 2 (two) times daily.      ipratropium-albuterol 0.5-2.5 (3) MG/3ML Soln   Commonly known as: DUONEB   Take 3 mLs by nebulization every 6 (six) hours as needed. For shortness of breath/ wheezing      lamivudine 100 MG tablet   Commonly known as: EPIVIR   Take 0.5 tablets (50 mg total) by mouth daily. Take one half tablet daily      lanthanum 500 MG chewable tablet   Commonly known as: FOSRENOL   Chew 500-1,500 mg by mouth 4 (four) times daily -  with meals and at bedtime. 1500 mg with meals and 500 mg at 8pm      lidocaine 5 %   Commonly known as: LIDODERM   Place 1 patch onto the skin daily. Remove & Discard patch within 12 hours or as directed by MD      lidocaine-prilocaine cream   Commonly known as: EMLA   Apply 1 application topically as needed. Apply 1 hour prior to dialysis access site      LORazepam 0.5 MG tablet   Commonly known as: ATIVAN   Take 0.125 mg by mouth every 6 (six) hours as needed. Take 1/4 of a tablet For increased anxiety      ondansetron 4 MG tablet   Commonly known as: ZOFRAN   Take 2 mg by mouth every 4 (four) hours as needed. nausea      oxyCODONE 5 MG immediate release tablet   Commonly known as: Oxy IR/ROXICODONE   Take 5 mg by mouth at bedtime.      pantoprazole 40 MG tablet   Commonly known as:  PROTONIX   Take 40 mg by mouth 2 (two) times daily.      raltegravir 400 MG tablet   Commonly known as: ISENTRESS   Take 400 mg by mouth 2 (two) times daily.  sildenafil 20 MG tablet   Commonly known as: REVATIO   Take 20 mg by mouth 3 (three) times daily.      vitamin C 500 MG tablet   Commonly known as: ASCORBIC ACID   Take 500 mg by mouth 2 (two) times daily.      Whey Protein Isolate Powd   Take 1 Package by mouth 2 (two) times daily. Mix With vanilla pudding      zidovudine 100 MG capsule   Commonly known as: RETROVIR   Take 100 mg by mouth 3 (three) times daily.          The results of significant diagnostics from this hospitalization (including imaging, microbiology, ancillary and laboratory) are listed below for reference.    Significant Diagnostic Studies: Dg Elbow 2 Views Right  2012-01-09  *RADIOLOGY REPORT*  Clinical Data: Fall, pain.  RIGHT ELBOW - 2 VIEW  Comparison: None.  Findings: Imaged bones, joints and soft tissues appear normal.  IMPRESSION: Negative exam.   Original Report Authenticated By: Arvid Right. D'ALESSIO, M.D.    Dg Wrist Complete Right  12/29/2011  *RADIOLOGY REPORT*  Clinical Data: Swelling and pain post fall.  RIGHT WRIST - COMPLETE 3+ VIEW  Comparison: None.  Findings: Diffuse osteopenia.  Carpal rows intact.  Negative for fracture, dislocation, or other acute abnormality.  Normal alignment  . No significant degenerative change.  Regional soft tissues unremarkable.  IMPRESSION:  Osteopenia without fracture or other acute abnormality   Original Report Authenticated By: Trecia Rogers, M.D.    Dg Hip Complete Left  Jan 09, 2012  *RADIOLOGY REPORT*  Clinical Data: Fall, left hip pain.  LEFT HIP - COMPLETE 2+ VIEW  Comparison: None.  Findings: Early degenerative changes in the hips bilaterally.  SI joints are symmetric and unremarkable. No acute bony abnormality. Specifically, no fracture, subluxation, or dislocation.  Soft tissues are  intact.  IMPRESSION: No acute bony abnormality.   Original Report Authenticated By: Raelyn Number, M.D.    Ct Head Wo Contrast  12/29/2011  *RADIOLOGY REPORT*  Clinical Data: Subdural hematoma.  CT HEAD WITHOUT CONTRAST  Technique:  Contiguous axial images were obtained from the base of the skull through the vertex without contrast.  Comparison: Head CT scan Jan 09, 2012.  Findings: Extra-axial hematoma over the high left convexities posteriorly is not markedly changed in thickness measuring approximately 1 cm.  The collection appears slightly less dense. Large overlying scalp hematoma is noted.  No new hemorrhage is seen.  There is no midline shift, hydrocephalus, mass lesion or evidence of acute infarction.  The calvarium is intact.  IMPRESSION: Small extra-axial hemorrhage over the high left convexities is not markedly changed in thickness but appears slightly less dense consistent with some evolutionary change.  Overlying scalp hematoma again noted.  No new abnormality.   Original Report Authenticated By: Arvid Right. Luther Parody, M.D.    Ct Head Wo Contrast  01-09-2012  *RADIOLOGY REPORT*  Clinical Data: Follow-up intracranial hemorrhage  CT HEAD WITHOUT CONTRAST  Technique:  Contiguous axial images were obtained from the base of the skull through the vertex without contrast.  Comparison: CT scan earlier in the day.  Findings: Persistent focal left parietal 10 mm thick collection, biconvex in shape, likely epidural hematoma is redemonstrated and unchanged from priors.  There is no skull fracture.  Left sided scalp hematoma remains.  Mild atrophy is present.  There is chronic microvascular ischemic change.  Cavum septum vergae.  No subarachnoid blood.  No midline shift.  Clear sinuses and mastoids.  IMPRESSION: Stable left parietal extra-axial collection, likely epidural hematoma.  No significant increase in size.  Stable scalp hematoma.   Original Report Authenticated By: Staci Righter, M.D.    Ct Head Wo  Contrast  12/28/2011  *RADIOLOGY REPORT*  Clinical Data: Fall with left sided head trauma.  Severe left sided headache.  CT HEAD WITHOUT CONTRAST  Technique:  Contiguous axial images were obtained from the base of the skull through the vertex without contrast.  Comparison: 01/29/2009.  Findings: High attenuation material is seen along the inner table of the high left parietal bone, deep to an associated scalp hematoma.  No significant mass effect.  No evidence of acute infarct, mass lesion or hydrocephalus.  There is periventricular low attenuation.  No associated fracture.  No air fluid levels in the visualized portions of the paranasal sinuses.  IMPRESSION:  1.  Small extra-axial hematoma along the high left parietal lobe, with overlying scalp hematoma.  No associated fracture. Critical Value/emergent results were called by telephone at the time of interpretation on 12/28/2011 at 1138 hours to Dr. Rogene Houston, who verbally acknowledged these results. 2.  Chronic microvascular white matter ischemic changes.   Original Report Authenticated By: Luretha Rued, M.D.    Ct Cervical Spine Wo Contrast  12/28/2011  *RADIOLOGY REPORT*  Clinical Data: [place] today, severe headaches.  CT CERVICAL SPINE WITHOUT CONTRAST  Technique:  Multidetector CT imaging of the cervical spine was performed. Multiplanar CT image reconstructions were also generated.  Comparison: CT neck from 08/24/2002.  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  There is severe facet arthropathy at C3-4 and C4-5 on the left. Moderate facet arthropathy is noted at C2-3 on the left.  There is 2 mm of facet mediated slip C4-C5.  No traumatic subluxation is seen.  There is no cervical spine fracture.  No prevertebral soft tissue swelling.  Lung apices are clear.  Trachea midline.  Fairly well circumscribed lesions are noted in the right division of C2 and T1. These measure roughly 7 mm in cross section  C2 and 5 mm cross-section at T1.  These are likely related to other smaller lesions noted posterior elements consistent with benign bone cysts. A similar lesion can be seen in T1 from 2004.  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  Advanced left-sided facet arthropathy.  No cervical spine fracture or traumatic subluxation.  Multiple bones cysts as described.   Original Report Authenticated By: Staci Righter, M.D.    Dg Chest Port 1 View  12/28/2011  *RADIOLOGY REPORT*  Clinical Data: Fall, hypertension.  PORTABLE CHEST - 1 VIEW  Comparison: 09/20/2011.  Chest CT 01/22/2009.  Findings: Bilateral perihilar fullness again noted. When compared to prior CT from 2010, this is likely stable and related to prominent pulmonary arteries, likely pulmonary arterial hypertension.  No change since prior study.  Mild diffuse interstitial prominence and cardiomegaly.  No effusions.  No acute bony abnormality.  IMPRESSION: Stable bilateral perihilar prominence.  Interstitial prominence could reflect interstitial edema.   Original Report Authenticated By: Raelyn Number, M.D.     Microbiology: Recent Results (from the past 240 hour(s))  MRSA PCR SCREENING     Status: Normal   Collection Time   12/28/11  5:16 PM      Component Value Range Status Comment   MRSA by PCR NEGATIVE  NEGATIVE Final      Labs: Basic  Metabolic Panel:  Lab [id number] 0740 12/29/11 0525 12/28/11 2125 12/28/11 1147  NA 140 141 138 137  K 3.8 4.0 4.1 3.7  CL 100 102 100 100  CO2 29 24 25 26   GLUCOSE 79 86 85 102*  BUN 26* 35* 31* 30*  CREATININE 8.27* 11.24* 10.37* 9.87*  CALCIUM 8.4 7.9* 8.4 8.6  MG -- -- -- --  PHOS 4.4 -- 5.6* --   Liver Function Tests:  Lab 12/31/11 0740 12/29/11 0525 12/28/11 2125 12/28/11 1147  AST -- 16 -- 21  ALT -- 6 -- 8  ALKPHOS -- 90 -- 99  BILITOT -- 0.5 -- 0.5  PROT -- 8.0 -- 8.9*  ALBUMIN 3.1* 3.1* 3.1* 3.4*   No results found for this basename: LIPASE:5,AMYLASE:5  in the last 168 hours No results found for this basename: AMMONIA:5 in the last 168 hours CBC:  Lab 12/31/11 0743 12/31/11 0450 12/29/11 0525 12/28/11 1147  WBC 3.2* 3.1* 3.0* 2.7*  NEUTROABS -- -- -- 1.9  HGB 9.6* 9.8* 9.0* 9.7*  HCT 31.2* 31.0* 28.9* 30.9*  MCV 100.0 100.3* 100.0 101.0*  PLT 86* 94* 86* 69*   Cardiac Enzymes: No results found for this basename: CKTOTAL:5,CKMB:5,CKMBINDEX:5,TROPONINI:5 in the last 168 hours BNP: BNP (last 3 results)  Basename 03/09/11 1503  PROBNP 3673.0*   CBG: No results found for this basename: GLUCAP:5 in the last 168 hours   Signed:  Velvet Bathe  [facility] 01/01/2012, 10:07 AM

## 2012-01-01 NOTE — Progress Notes (Signed)
Clinical Social Work  CSW faxed dc summary to [employer] who is agreeable to admission. CSW prepared dc packet with FL2 and hard scripts. CSW informed patient, friend and RN of dc and all parties agreeable. CSW coordinated transportation via [place]. CSW is signing off.  [place], [place] [phone] (Coverage for [facility])

## 2012-01-02 DIAGNOSIS — N2581 Secondary hyperparathyroidism of renal origin: Secondary | ICD-10-CM | POA: Diagnosis not present

## 2012-01-02 DIAGNOSIS — N039 Chronic nephritic syndrome with unspecified morphologic changes: Secondary | ICD-10-CM | POA: Diagnosis not present

## 2012-01-02 DIAGNOSIS — D472 Monoclonal gammopathy: Secondary | ICD-10-CM | POA: Diagnosis not present

## 2012-01-02 DIAGNOSIS — D631 Anemia in chronic kidney disease: Secondary | ICD-10-CM | POA: Diagnosis not present

## 2012-01-02 DIAGNOSIS — D509 Iron deficiency anemia, unspecified: Secondary | ICD-10-CM | POA: Diagnosis not present

## 2012-01-02 DIAGNOSIS — N186 End stage renal disease: Secondary | ICD-10-CM | POA: Diagnosis not present

## 2012-01-04 DIAGNOSIS — I629 Nontraumatic intracranial hemorrhage, unspecified: Secondary | ICD-10-CM | POA: Diagnosis not present

## 2012-01-04 DIAGNOSIS — I6200 Nontraumatic subdural hemorrhage, unspecified: Secondary | ICD-10-CM | POA: Diagnosis not present

## 2012-01-04 NOTE — Care Management Note (Signed)
    Page 1 of 1   01/04/2012     8:09:30 AM   CARE MANAGEMENT NOTE 01/04/2012  Patient:  [name], [name]   Account Number:  [id number]  Date Initiated:  01/01/2012  Documentation initiated by:  [facility]  Subjective/Objective Assessment:   Admitted with intracranial bleed from [facility]     Action/Plan:   return to SNF   Anticipated DC Date:  01/01/2012   Anticipated DC Plan:  [place]  In-house referral  Clinical Social Worker      DC Planning Services  CM consult      Choice offered to / List presented to:             Status of service:  Completed, signed off Medicare Important Message given?   (If response is "NO", the following Medicare IM given date fields will be blank) Date Medicare IM given:   Date Additional Medicare IM given:    Discharge Disposition:  [place]  Per UR Regulation:  Reviewed for med. necessity/level of care/duration of stay  If discussed at [place] of Stay Meetings, dates discussed:    Comments:

## 2012-01-06 DIAGNOSIS — I629 Nontraumatic intracranial hemorrhage, unspecified: Secondary | ICD-10-CM | POA: Diagnosis not present

## 2012-01-07 DIAGNOSIS — D509 Iron deficiency anemia, unspecified: Secondary | ICD-10-CM | POA: Diagnosis not present

## 2012-01-07 DIAGNOSIS — I629 Nontraumatic intracranial hemorrhage, unspecified: Secondary | ICD-10-CM | POA: Diagnosis not present

## 2012-01-07 DIAGNOSIS — N039 Chronic nephritic syndrome with unspecified morphologic changes: Secondary | ICD-10-CM | POA: Diagnosis not present

## 2012-01-07 DIAGNOSIS — D472 Monoclonal gammopathy: Secondary | ICD-10-CM | POA: Diagnosis not present

## 2012-01-07 DIAGNOSIS — N2581 Secondary hyperparathyroidism of renal origin: Secondary | ICD-10-CM | POA: Diagnosis not present

## 2012-01-07 DIAGNOSIS — N186 End stage renal disease: Secondary | ICD-10-CM | POA: Diagnosis not present

## 2012-01-08 DIAGNOSIS — I629 Nontraumatic intracranial hemorrhage, unspecified: Secondary | ICD-10-CM | POA: Diagnosis not present

## 2012-01-11 DIAGNOSIS — I629 Nontraumatic intracranial hemorrhage, unspecified: Secondary | ICD-10-CM | POA: Diagnosis not present

## 2012-01-12 DIAGNOSIS — N2581 Secondary hyperparathyroidism of renal origin: Secondary | ICD-10-CM | POA: Diagnosis not present

## 2012-01-12 DIAGNOSIS — N186 End stage renal disease: Secondary | ICD-10-CM | POA: Diagnosis not present

## 2012-01-12 DIAGNOSIS — D509 Iron deficiency anemia, unspecified: Secondary | ICD-10-CM | POA: Diagnosis not present

## 2012-01-12 DIAGNOSIS — I629 Nontraumatic intracranial hemorrhage, unspecified: Secondary | ICD-10-CM | POA: Diagnosis not present

## 2012-01-12 DIAGNOSIS — D472 Monoclonal gammopathy: Secondary | ICD-10-CM | POA: Diagnosis not present

## 2012-01-12 DIAGNOSIS — D631 Anemia in chronic kidney disease: Secondary | ICD-10-CM | POA: Diagnosis not present

## 2012-01-14 DIAGNOSIS — N186 End stage renal disease: Secondary | ICD-10-CM | POA: Diagnosis not present

## 2012-01-14 DIAGNOSIS — D631 Anemia in chronic kidney disease: Secondary | ICD-10-CM | POA: Diagnosis not present

## 2012-01-14 DIAGNOSIS — D509 Iron deficiency anemia, unspecified: Secondary | ICD-10-CM | POA: Diagnosis not present

## 2012-01-14 DIAGNOSIS — N2581 Secondary hyperparathyroidism of renal origin: Secondary | ICD-10-CM | POA: Diagnosis not present

## 2012-01-14 DIAGNOSIS — G894 Chronic pain syndrome: Secondary | ICD-10-CM | POA: Diagnosis not present

## 2012-01-14 DIAGNOSIS — D472 Monoclonal gammopathy: Secondary | ICD-10-CM | POA: Diagnosis not present

## 2012-01-15 DIAGNOSIS — I629 Nontraumatic intracranial hemorrhage, unspecified: Secondary | ICD-10-CM | POA: Diagnosis not present

## 2012-01-16 DIAGNOSIS — I629 Nontraumatic intracranial hemorrhage, unspecified: Secondary | ICD-10-CM | POA: Diagnosis not present

## 2012-01-18 DIAGNOSIS — I629 Nontraumatic intracranial hemorrhage, unspecified: Secondary | ICD-10-CM | POA: Diagnosis not present

## 2012-01-19 DIAGNOSIS — D509 Iron deficiency anemia, unspecified: Secondary | ICD-10-CM | POA: Diagnosis not present

## 2012-01-19 DIAGNOSIS — N2581 Secondary hyperparathyroidism of renal origin: Secondary | ICD-10-CM | POA: Diagnosis not present

## 2012-01-19 DIAGNOSIS — N186 End stage renal disease: Secondary | ICD-10-CM | POA: Diagnosis not present

## 2012-01-19 DIAGNOSIS — D472 Monoclonal gammopathy: Secondary | ICD-10-CM | POA: Diagnosis not present

## 2012-01-19 DIAGNOSIS — N039 Chronic nephritic syndrome with unspecified morphologic changes: Secondary | ICD-10-CM | POA: Diagnosis not present

## 2012-01-20 DIAGNOSIS — I629 Nontraumatic intracranial hemorrhage, unspecified: Secondary | ICD-10-CM | POA: Diagnosis not present

## 2012-01-21 DIAGNOSIS — N186 End stage renal disease: Secondary | ICD-10-CM | POA: Diagnosis not present

## 2012-01-21 DIAGNOSIS — D631 Anemia in chronic kidney disease: Secondary | ICD-10-CM | POA: Diagnosis not present

## 2012-01-21 DIAGNOSIS — D509 Iron deficiency anemia, unspecified: Secondary | ICD-10-CM | POA: Diagnosis not present

## 2012-01-21 DIAGNOSIS — N2581 Secondary hyperparathyroidism of renal origin: Secondary | ICD-10-CM | POA: Diagnosis not present

## 2012-01-21 DIAGNOSIS — D472 Monoclonal gammopathy: Secondary | ICD-10-CM | POA: Diagnosis not present

## 2012-01-22 DIAGNOSIS — I629 Nontraumatic intracranial hemorrhage, unspecified: Secondary | ICD-10-CM | POA: Diagnosis not present

## 2012-01-26 DIAGNOSIS — I629 Nontraumatic intracranial hemorrhage, unspecified: Secondary | ICD-10-CM | POA: Diagnosis not present

## 2012-01-27 DIAGNOSIS — D472 Monoclonal gammopathy: Secondary | ICD-10-CM | POA: Diagnosis not present

## 2012-01-27 DIAGNOSIS — D509 Iron deficiency anemia, unspecified: Secondary | ICD-10-CM | POA: Diagnosis not present

## 2012-01-27 DIAGNOSIS — N186 End stage renal disease: Secondary | ICD-10-CM | POA: Diagnosis not present

## 2012-01-27 DIAGNOSIS — I629 Nontraumatic intracranial hemorrhage, unspecified: Secondary | ICD-10-CM | POA: Diagnosis not present

## 2012-01-27 DIAGNOSIS — N2581 Secondary hyperparathyroidism of renal origin: Secondary | ICD-10-CM | POA: Diagnosis not present

## 2012-01-27 DIAGNOSIS — D631 Anemia in chronic kidney disease: Secondary | ICD-10-CM | POA: Diagnosis not present

## 2012-01-29 DIAGNOSIS — I629 Nontraumatic intracranial hemorrhage, unspecified: Secondary | ICD-10-CM | POA: Diagnosis not present

## 2012-01-30 DIAGNOSIS — N186 End stage renal disease: Secondary | ICD-10-CM | POA: Diagnosis not present

## 2012-02-02 DIAGNOSIS — N2581 Secondary hyperparathyroidism of renal origin: Secondary | ICD-10-CM | POA: Diagnosis not present

## 2012-02-02 DIAGNOSIS — D631 Anemia in chronic kidney disease: Secondary | ICD-10-CM | POA: Diagnosis not present

## 2012-02-02 DIAGNOSIS — N186 End stage renal disease: Secondary | ICD-10-CM | POA: Diagnosis not present

## 2012-02-02 DIAGNOSIS — D472 Monoclonal gammopathy: Secondary | ICD-10-CM | POA: Diagnosis not present

## 2012-02-02 DIAGNOSIS — N039 Chronic nephritic syndrome with unspecified morphologic changes: Secondary | ICD-10-CM | POA: Diagnosis not present

## 2012-02-02 DIAGNOSIS — Z79899 Other long term (current) drug therapy: Secondary | ICD-10-CM | POA: Diagnosis not present

## 2012-02-02 DIAGNOSIS — D509 Iron deficiency anemia, unspecified: Secondary | ICD-10-CM | POA: Diagnosis not present

## 2012-02-03 DIAGNOSIS — Z21 Asymptomatic human immunodeficiency virus [HIV] infection status: Secondary | ICD-10-CM | POA: Diagnosis not present

## 2012-02-06 DIAGNOSIS — D509 Iron deficiency anemia, unspecified: Secondary | ICD-10-CM | POA: Diagnosis not present

## 2012-02-06 DIAGNOSIS — N186 End stage renal disease: Secondary | ICD-10-CM | POA: Diagnosis not present

## 2012-02-06 DIAGNOSIS — Z79899 Other long term (current) drug therapy: Secondary | ICD-10-CM | POA: Diagnosis not present

## 2012-02-06 DIAGNOSIS — N039 Chronic nephritic syndrome with unspecified morphologic changes: Secondary | ICD-10-CM | POA: Diagnosis not present

## 2012-02-06 DIAGNOSIS — D472 Monoclonal gammopathy: Secondary | ICD-10-CM | POA: Diagnosis not present

## 2012-02-06 DIAGNOSIS — N2581 Secondary hyperparathyroidism of renal origin: Secondary | ICD-10-CM | POA: Diagnosis not present

## 2012-02-09 DIAGNOSIS — Z79899 Other long term (current) drug therapy: Secondary | ICD-10-CM | POA: Diagnosis not present

## 2012-02-09 DIAGNOSIS — N039 Chronic nephritic syndrome with unspecified morphologic changes: Secondary | ICD-10-CM | POA: Diagnosis not present

## 2012-02-09 DIAGNOSIS — N2581 Secondary hyperparathyroidism of renal origin: Secondary | ICD-10-CM | POA: Diagnosis not present

## 2012-02-09 DIAGNOSIS — N186 End stage renal disease: Secondary | ICD-10-CM | POA: Diagnosis not present

## 2012-02-09 DIAGNOSIS — D472 Monoclonal gammopathy: Secondary | ICD-10-CM | POA: Diagnosis not present

## 2012-02-09 DIAGNOSIS — D509 Iron deficiency anemia, unspecified: Secondary | ICD-10-CM | POA: Diagnosis not present

## 2012-02-11 DIAGNOSIS — D509 Iron deficiency anemia, unspecified: Secondary | ICD-10-CM | POA: Diagnosis not present

## 2012-02-11 DIAGNOSIS — N039 Chronic nephritic syndrome with unspecified morphologic changes: Secondary | ICD-10-CM | POA: Diagnosis not present

## 2012-02-11 DIAGNOSIS — N186 End stage renal disease: Secondary | ICD-10-CM | POA: Diagnosis not present

## 2012-02-11 DIAGNOSIS — Z79899 Other long term (current) drug therapy: Secondary | ICD-10-CM | POA: Diagnosis not present

## 2012-02-11 DIAGNOSIS — D472 Monoclonal gammopathy: Secondary | ICD-10-CM | POA: Diagnosis not present

## 2012-02-11 DIAGNOSIS — G894 Chronic pain syndrome: Secondary | ICD-10-CM | POA: Diagnosis not present

## 2012-02-11 DIAGNOSIS — N2581 Secondary hyperparathyroidism of renal origin: Secondary | ICD-10-CM | POA: Diagnosis not present

## 2012-02-13 DIAGNOSIS — D631 Anemia in chronic kidney disease: Secondary | ICD-10-CM | POA: Diagnosis not present

## 2012-02-13 DIAGNOSIS — D472 Monoclonal gammopathy: Secondary | ICD-10-CM | POA: Diagnosis not present

## 2012-02-13 DIAGNOSIS — N186 End stage renal disease: Secondary | ICD-10-CM | POA: Diagnosis not present

## 2012-02-13 DIAGNOSIS — N2581 Secondary hyperparathyroidism of renal origin: Secondary | ICD-10-CM | POA: Diagnosis not present

## 2012-02-13 DIAGNOSIS — D509 Iron deficiency anemia, unspecified: Secondary | ICD-10-CM | POA: Diagnosis not present

## 2012-02-13 DIAGNOSIS — Z79899 Other long term (current) drug therapy: Secondary | ICD-10-CM | POA: Diagnosis not present

## 2012-02-18 DIAGNOSIS — Z79899 Other long term (current) drug therapy: Secondary | ICD-10-CM | POA: Diagnosis not present

## 2012-02-18 DIAGNOSIS — D472 Monoclonal gammopathy: Secondary | ICD-10-CM | POA: Diagnosis not present

## 2012-02-18 DIAGNOSIS — D631 Anemia in chronic kidney disease: Secondary | ICD-10-CM | POA: Diagnosis not present

## 2012-02-18 DIAGNOSIS — N2581 Secondary hyperparathyroidism of renal origin: Secondary | ICD-10-CM | POA: Diagnosis not present

## 2012-02-18 DIAGNOSIS — D509 Iron deficiency anemia, unspecified: Secondary | ICD-10-CM | POA: Diagnosis not present

## 2012-02-18 DIAGNOSIS — N186 End stage renal disease: Secondary | ICD-10-CM | POA: Diagnosis not present

## 2012-02-20 DIAGNOSIS — N186 End stage renal disease: Secondary | ICD-10-CM | POA: Diagnosis not present

## 2012-02-20 DIAGNOSIS — N039 Chronic nephritic syndrome with unspecified morphologic changes: Secondary | ICD-10-CM | POA: Diagnosis not present

## 2012-02-20 DIAGNOSIS — D472 Monoclonal gammopathy: Secondary | ICD-10-CM | POA: Diagnosis not present

## 2012-02-20 DIAGNOSIS — D509 Iron deficiency anemia, unspecified: Secondary | ICD-10-CM | POA: Diagnosis not present

## 2012-02-20 DIAGNOSIS — Z79899 Other long term (current) drug therapy: Secondary | ICD-10-CM | POA: Diagnosis not present

## 2012-02-20 DIAGNOSIS — N2581 Secondary hyperparathyroidism of renal origin: Secondary | ICD-10-CM | POA: Diagnosis not present

## 2012-02-25 DIAGNOSIS — N186 End stage renal disease: Secondary | ICD-10-CM | POA: Diagnosis not present

## 2012-02-25 DIAGNOSIS — D472 Monoclonal gammopathy: Secondary | ICD-10-CM | POA: Diagnosis not present

## 2012-02-25 DIAGNOSIS — N2581 Secondary hyperparathyroidism of renal origin: Secondary | ICD-10-CM | POA: Diagnosis not present

## 2012-02-25 DIAGNOSIS — Z79899 Other long term (current) drug therapy: Secondary | ICD-10-CM | POA: Diagnosis not present

## 2012-02-25 DIAGNOSIS — D631 Anemia in chronic kidney disease: Secondary | ICD-10-CM | POA: Diagnosis not present

## 2012-02-25 DIAGNOSIS — D509 Iron deficiency anemia, unspecified: Secondary | ICD-10-CM | POA: Diagnosis not present

## 2012-02-27 DIAGNOSIS — N2581 Secondary hyperparathyroidism of renal origin: Secondary | ICD-10-CM | POA: Diagnosis not present

## 2012-02-27 DIAGNOSIS — N039 Chronic nephritic syndrome with unspecified morphologic changes: Secondary | ICD-10-CM | POA: Diagnosis not present

## 2012-02-27 DIAGNOSIS — D472 Monoclonal gammopathy: Secondary | ICD-10-CM | POA: Diagnosis not present

## 2012-02-27 DIAGNOSIS — Z79899 Other long term (current) drug therapy: Secondary | ICD-10-CM | POA: Diagnosis not present

## 2012-02-27 DIAGNOSIS — N186 End stage renal disease: Secondary | ICD-10-CM | POA: Diagnosis not present

## 2012-02-27 DIAGNOSIS — D509 Iron deficiency anemia, unspecified: Secondary | ICD-10-CM | POA: Diagnosis not present

## 2012-03-01 ENCOUNTER — Other Ambulatory Visit (HOSPITAL_BASED_OUTPATIENT_CLINIC_OR_DEPARTMENT_OTHER): Payer: Self-pay | Admitting: Internal Medicine

## 2012-03-01 DIAGNOSIS — C50919 Malignant neoplasm of unspecified site of unspecified female breast: Secondary | ICD-10-CM

## 2012-03-01 DIAGNOSIS — N186 End stage renal disease: Secondary | ICD-10-CM | POA: Diagnosis not present

## 2012-03-05 DIAGNOSIS — N039 Chronic nephritic syndrome with unspecified morphologic changes: Secondary | ICD-10-CM | POA: Diagnosis not present

## 2012-03-05 DIAGNOSIS — N186 End stage renal disease: Secondary | ICD-10-CM | POA: Diagnosis not present

## 2012-03-05 DIAGNOSIS — D472 Monoclonal gammopathy: Secondary | ICD-10-CM | POA: Diagnosis not present

## 2012-03-05 DIAGNOSIS — N2581 Secondary hyperparathyroidism of renal origin: Secondary | ICD-10-CM | POA: Diagnosis not present

## 2012-03-05 DIAGNOSIS — D509 Iron deficiency anemia, unspecified: Secondary | ICD-10-CM | POA: Diagnosis not present

## 2012-03-05 DIAGNOSIS — D631 Anemia in chronic kidney disease: Secondary | ICD-10-CM | POA: Diagnosis not present

## 2012-03-10 DIAGNOSIS — D509 Iron deficiency anemia, unspecified: Secondary | ICD-10-CM | POA: Diagnosis not present

## 2012-03-10 DIAGNOSIS — N2581 Secondary hyperparathyroidism of renal origin: Secondary | ICD-10-CM | POA: Diagnosis not present

## 2012-03-10 DIAGNOSIS — N186 End stage renal disease: Secondary | ICD-10-CM | POA: Diagnosis not present

## 2012-03-10 DIAGNOSIS — G894 Chronic pain syndrome: Secondary | ICD-10-CM | POA: Diagnosis not present

## 2012-03-10 DIAGNOSIS — D472 Monoclonal gammopathy: Secondary | ICD-10-CM | POA: Diagnosis not present

## 2012-03-10 DIAGNOSIS — D631 Anemia in chronic kidney disease: Secondary | ICD-10-CM | POA: Diagnosis not present

## 2012-03-11 DIAGNOSIS — E039 Hypothyroidism, unspecified: Secondary | ICD-10-CM | POA: Diagnosis not present

## 2012-03-12 DIAGNOSIS — N2581 Secondary hyperparathyroidism of renal origin: Secondary | ICD-10-CM | POA: Diagnosis not present

## 2012-03-12 DIAGNOSIS — N039 Chronic nephritic syndrome with unspecified morphologic changes: Secondary | ICD-10-CM | POA: Diagnosis not present

## 2012-03-12 DIAGNOSIS — N186 End stage renal disease: Secondary | ICD-10-CM | POA: Diagnosis not present

## 2012-03-12 DIAGNOSIS — D509 Iron deficiency anemia, unspecified: Secondary | ICD-10-CM | POA: Diagnosis not present

## 2012-03-12 DIAGNOSIS — D472 Monoclonal gammopathy: Secondary | ICD-10-CM | POA: Diagnosis not present

## 2012-03-16 ENCOUNTER — Encounter (HOSPITAL_COMMUNITY): Payer: Medicare Other

## 2012-03-17 DIAGNOSIS — N186 End stage renal disease: Secondary | ICD-10-CM | POA: Diagnosis not present

## 2012-03-17 DIAGNOSIS — D509 Iron deficiency anemia, unspecified: Secondary | ICD-10-CM | POA: Diagnosis not present

## 2012-03-17 DIAGNOSIS — N2581 Secondary hyperparathyroidism of renal origin: Secondary | ICD-10-CM | POA: Diagnosis not present

## 2012-03-17 DIAGNOSIS — D472 Monoclonal gammopathy: Secondary | ICD-10-CM | POA: Diagnosis not present

## 2012-03-17 DIAGNOSIS — N039 Chronic nephritic syndrome with unspecified morphologic changes: Secondary | ICD-10-CM | POA: Diagnosis not present

## 2012-03-19 DIAGNOSIS — N186 End stage renal disease: Secondary | ICD-10-CM | POA: Diagnosis not present

## 2012-03-19 DIAGNOSIS — D472 Monoclonal gammopathy: Secondary | ICD-10-CM | POA: Diagnosis not present

## 2012-03-19 DIAGNOSIS — N2581 Secondary hyperparathyroidism of renal origin: Secondary | ICD-10-CM | POA: Diagnosis not present

## 2012-03-19 DIAGNOSIS — E039 Hypothyroidism, unspecified: Secondary | ICD-10-CM | POA: Diagnosis not present

## 2012-03-19 DIAGNOSIS — D631 Anemia in chronic kidney disease: Secondary | ICD-10-CM | POA: Diagnosis not present

## 2012-03-19 DIAGNOSIS — D509 Iron deficiency anemia, unspecified: Secondary | ICD-10-CM | POA: Diagnosis not present

## 2012-03-22 DIAGNOSIS — N039 Chronic nephritic syndrome with unspecified morphologic changes: Secondary | ICD-10-CM | POA: Diagnosis not present

## 2012-03-22 DIAGNOSIS — N186 End stage renal disease: Secondary | ICD-10-CM | POA: Diagnosis not present

## 2012-03-22 DIAGNOSIS — D509 Iron deficiency anemia, unspecified: Secondary | ICD-10-CM | POA: Diagnosis not present

## 2012-03-22 DIAGNOSIS — N2581 Secondary hyperparathyroidism of renal origin: Secondary | ICD-10-CM | POA: Diagnosis not present

## 2012-03-22 DIAGNOSIS — D631 Anemia in chronic kidney disease: Secondary | ICD-10-CM | POA: Diagnosis not present

## 2012-03-22 DIAGNOSIS — D472 Monoclonal gammopathy: Secondary | ICD-10-CM | POA: Diagnosis not present

## 2012-03-24 DIAGNOSIS — N2581 Secondary hyperparathyroidism of renal origin: Secondary | ICD-10-CM | POA: Diagnosis not present

## 2012-03-24 DIAGNOSIS — D509 Iron deficiency anemia, unspecified: Secondary | ICD-10-CM | POA: Diagnosis not present

## 2012-03-24 DIAGNOSIS — D472 Monoclonal gammopathy: Secondary | ICD-10-CM | POA: Diagnosis not present

## 2012-03-24 DIAGNOSIS — N039 Chronic nephritic syndrome with unspecified morphologic changes: Secondary | ICD-10-CM | POA: Diagnosis not present

## 2012-03-24 DIAGNOSIS — N186 End stage renal disease: Secondary | ICD-10-CM | POA: Diagnosis not present

## 2012-03-26 DIAGNOSIS — D631 Anemia in chronic kidney disease: Secondary | ICD-10-CM | POA: Diagnosis not present

## 2012-03-26 DIAGNOSIS — D509 Iron deficiency anemia, unspecified: Secondary | ICD-10-CM | POA: Diagnosis not present

## 2012-03-26 DIAGNOSIS — N186 End stage renal disease: Secondary | ICD-10-CM | POA: Diagnosis not present

## 2012-03-26 DIAGNOSIS — N2581 Secondary hyperparathyroidism of renal origin: Secondary | ICD-10-CM | POA: Diagnosis not present

## 2012-03-26 DIAGNOSIS — D472 Monoclonal gammopathy: Secondary | ICD-10-CM | POA: Diagnosis not present

## 2012-03-28 DIAGNOSIS — T82898A Other specified complication of vascular prosthetic devices, implants and grafts, initial encounter: Secondary | ICD-10-CM | POA: Diagnosis not present

## 2012-03-28 DIAGNOSIS — I871 Compression of vein: Secondary | ICD-10-CM | POA: Diagnosis not present

## 2012-03-28 DIAGNOSIS — N186 End stage renal disease: Secondary | ICD-10-CM | POA: Diagnosis not present

## 2012-03-29 DIAGNOSIS — N2581 Secondary hyperparathyroidism of renal origin: Secondary | ICD-10-CM | POA: Diagnosis not present

## 2012-03-29 DIAGNOSIS — N186 End stage renal disease: Secondary | ICD-10-CM | POA: Diagnosis not present

## 2012-03-29 DIAGNOSIS — D509 Iron deficiency anemia, unspecified: Secondary | ICD-10-CM | POA: Diagnosis not present

## 2012-03-29 DIAGNOSIS — N039 Chronic nephritic syndrome with unspecified morphologic changes: Secondary | ICD-10-CM | POA: Diagnosis not present

## 2012-03-29 DIAGNOSIS — D472 Monoclonal gammopathy: Secondary | ICD-10-CM | POA: Diagnosis not present

## 2012-03-31 DIAGNOSIS — N2581 Secondary hyperparathyroidism of renal origin: Secondary | ICD-10-CM | POA: Diagnosis not present

## 2012-03-31 DIAGNOSIS — N039 Chronic nephritic syndrome with unspecified morphologic changes: Secondary | ICD-10-CM | POA: Diagnosis not present

## 2012-03-31 DIAGNOSIS — D509 Iron deficiency anemia, unspecified: Secondary | ICD-10-CM | POA: Diagnosis not present

## 2012-03-31 DIAGNOSIS — D472 Monoclonal gammopathy: Secondary | ICD-10-CM | POA: Diagnosis not present

## 2012-03-31 DIAGNOSIS — N186 End stage renal disease: Secondary | ICD-10-CM | POA: Diagnosis not present

## 2012-04-01 DIAGNOSIS — N186 End stage renal disease: Secondary | ICD-10-CM | POA: Diagnosis not present

## 2012-04-02 DIAGNOSIS — N039 Chronic nephritic syndrome with unspecified morphologic changes: Secondary | ICD-10-CM | POA: Diagnosis not present

## 2012-04-02 DIAGNOSIS — D472 Monoclonal gammopathy: Secondary | ICD-10-CM | POA: Diagnosis not present

## 2012-04-02 DIAGNOSIS — D509 Iron deficiency anemia, unspecified: Secondary | ICD-10-CM | POA: Diagnosis not present

## 2012-04-02 DIAGNOSIS — N2581 Secondary hyperparathyroidism of renal origin: Secondary | ICD-10-CM | POA: Diagnosis not present

## 2012-04-02 DIAGNOSIS — N186 End stage renal disease: Secondary | ICD-10-CM | POA: Diagnosis not present

## 2012-04-02 DIAGNOSIS — D631 Anemia in chronic kidney disease: Secondary | ICD-10-CM | POA: Diagnosis not present

## 2012-04-05 DIAGNOSIS — D649 Anemia, unspecified: Secondary | ICD-10-CM | POA: Diagnosis not present

## 2012-04-05 DIAGNOSIS — D472 Monoclonal gammopathy: Secondary | ICD-10-CM | POA: Diagnosis not present

## 2012-04-05 DIAGNOSIS — D509 Iron deficiency anemia, unspecified: Secondary | ICD-10-CM | POA: Diagnosis not present

## 2012-04-05 DIAGNOSIS — B20 Human immunodeficiency virus [HIV] disease: Secondary | ICD-10-CM | POA: Diagnosis not present

## 2012-04-05 DIAGNOSIS — N2581 Secondary hyperparathyroidism of renal origin: Secondary | ICD-10-CM | POA: Diagnosis not present

## 2012-04-05 DIAGNOSIS — D631 Anemia in chronic kidney disease: Secondary | ICD-10-CM | POA: Diagnosis not present

## 2012-04-05 DIAGNOSIS — N186 End stage renal disease: Secondary | ICD-10-CM | POA: Diagnosis not present

## 2012-04-05 DIAGNOSIS — Z79899 Other long term (current) drug therapy: Secondary | ICD-10-CM | POA: Diagnosis not present

## 2012-04-06 ENCOUNTER — Ambulatory Visit (HOSPITAL_COMMUNITY)
Admission: RE | Admit: 2012-04-06 | Discharge: 2012-04-06 | Disposition: A | Discharge status: Home / Self Care | Payer: Medicare Other | Source: Ambulatory Visit | Attending: Internal Medicine | Admitting: Internal Medicine

## 2012-04-06 ENCOUNTER — Other Ambulatory Visit (HOSPITAL_BASED_OUTPATIENT_CLINIC_OR_DEPARTMENT_OTHER): Payer: Self-pay | Admitting: Internal Medicine

## 2012-04-06 DIAGNOSIS — N6489 Other specified disorders of breast: Secondary | ICD-10-CM | POA: Diagnosis not present

## 2012-04-06 DIAGNOSIS — C50919 Malignant neoplasm of unspecified site of unspecified female breast: Secondary | ICD-10-CM

## 2012-04-06 DIAGNOSIS — B20 Human immunodeficiency virus [HIV] disease: Secondary | ICD-10-CM | POA: Diagnosis not present

## 2012-04-06 DIAGNOSIS — N61 Mastitis without abscess: Secondary | ICD-10-CM | POA: Diagnosis not present

## 2012-04-06 DIAGNOSIS — N63 Unspecified lump in unspecified breast: Secondary | ICD-10-CM | POA: Diagnosis not present

## 2012-04-06 IMAGING — US US BREAST*R*
1 series · 5 of 5 positions shown · non-contrast
Comparison: [DATE] and [DATE]

CLINICAL DATA: 61-year-old HIV positive patient.  The patient
complains of right breast thickening that has been present since
[DATE].  The patient states it is mildly uncomfortable but
denies erythema. The patient gets dialysis for kidney failure.  The
patient does not recall seeing a general surgeon or having a punch
biopsy of the right breast.

DIGITAL DIAGNOSTIC BILATERAL MAMMOGRAM WITH CAD AND RIGHT BREAST
ULTRASOUND:

[Series 1: us breast*right* · 0.08mm/px · 5 of 5 slices shown]
[im 1/5]
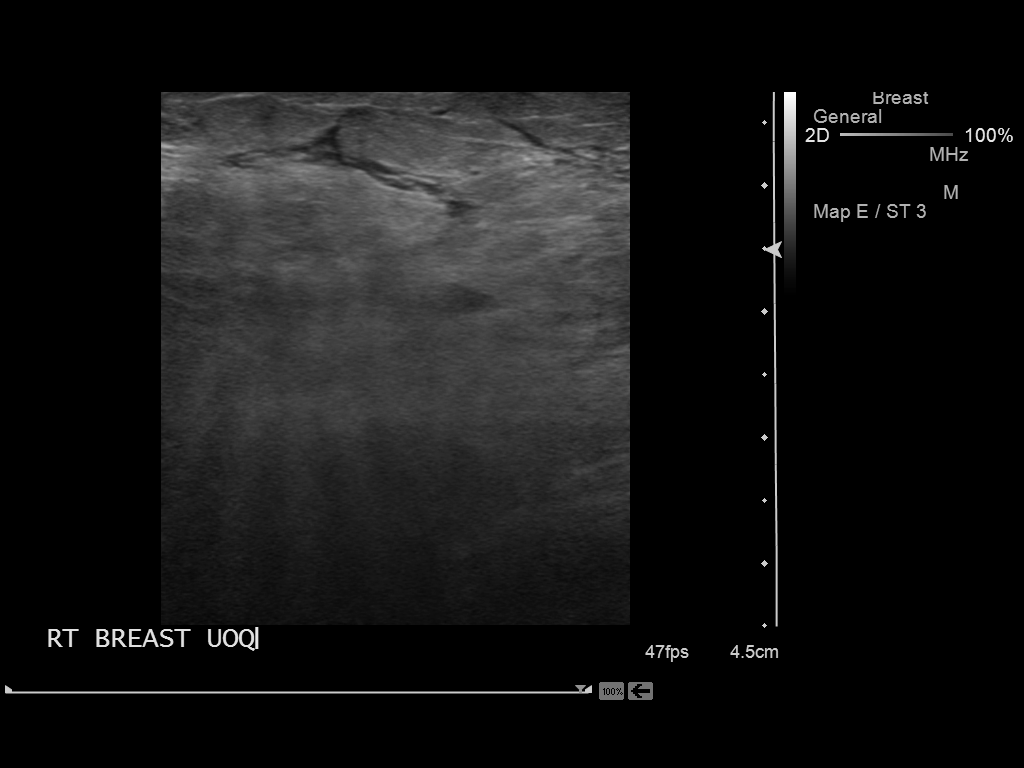
[im 2/5]
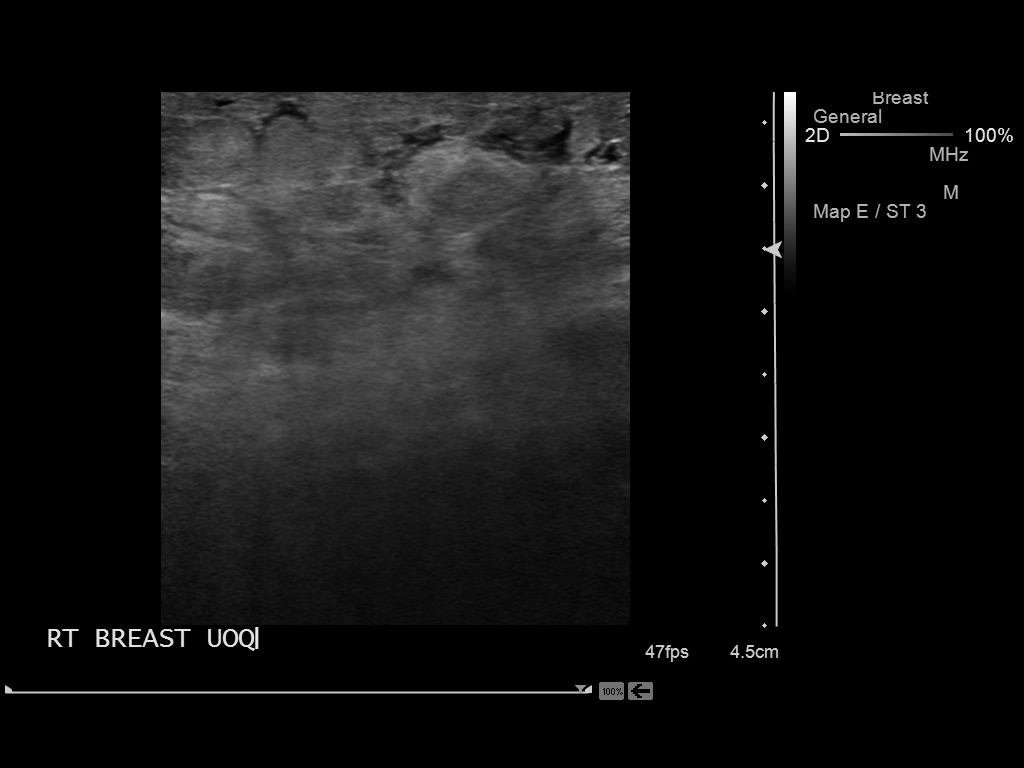
[im 3/5]
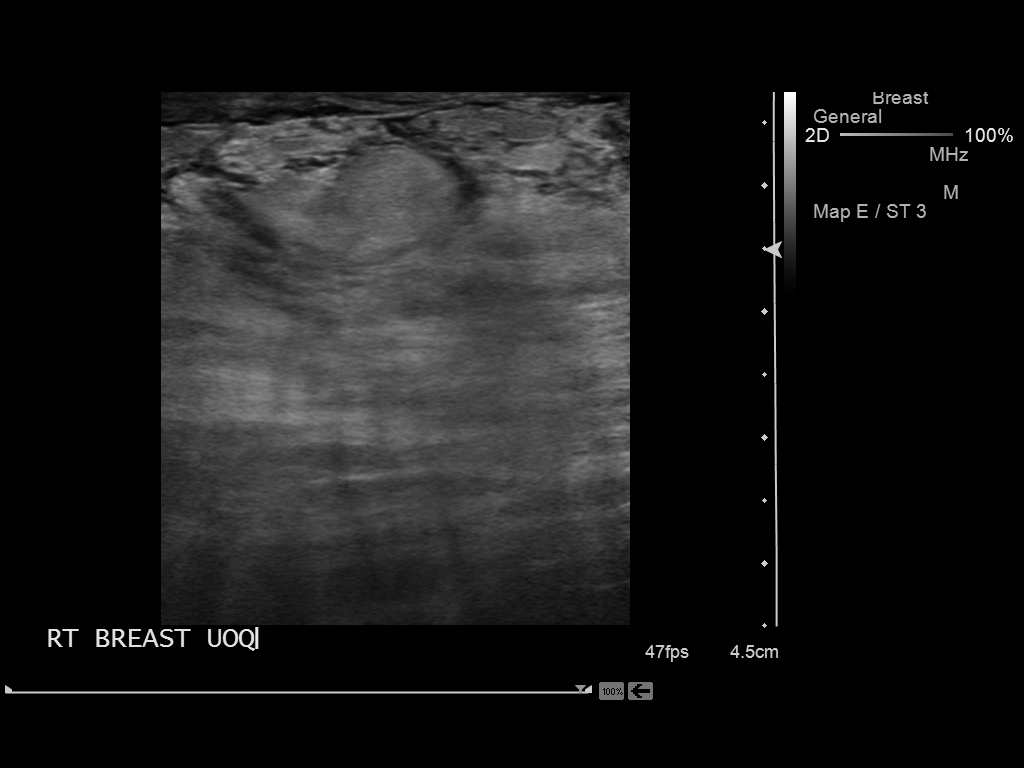
[im 4/5]
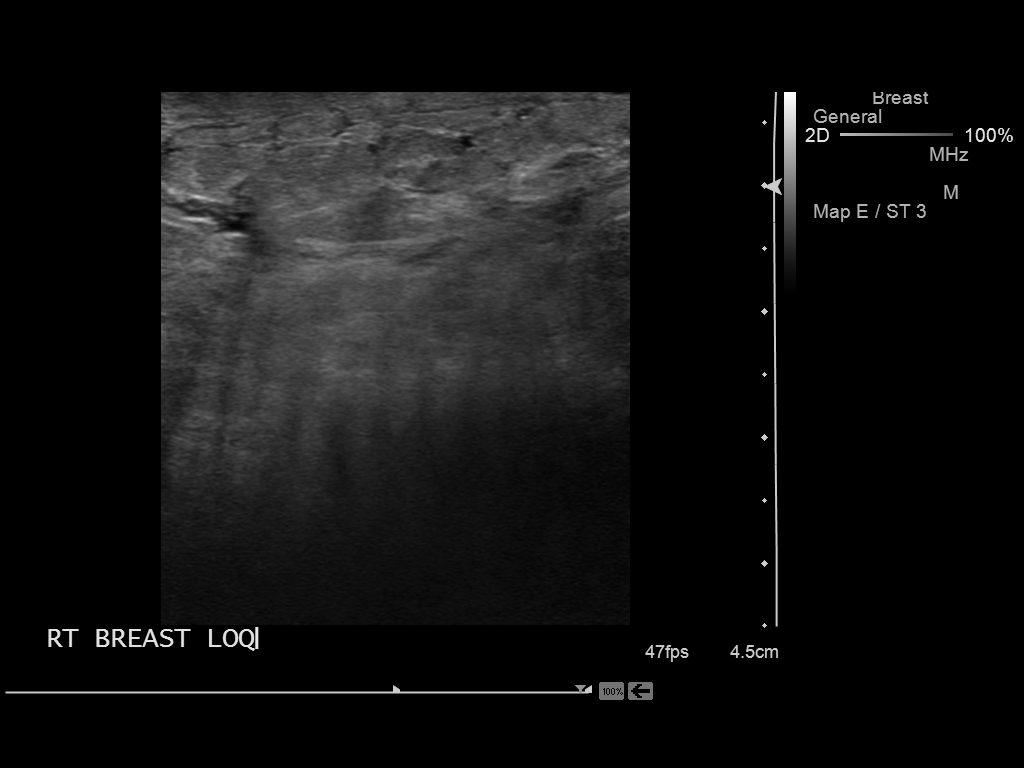
[im 5/5]
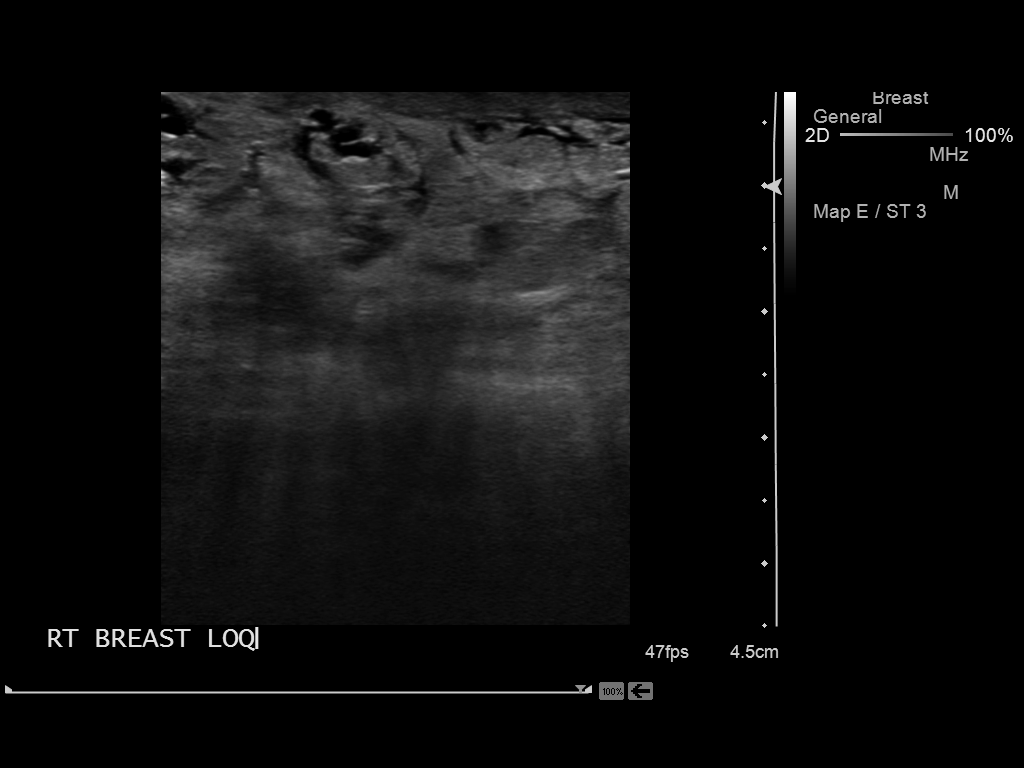

[5 of 5 positions shown; findings below may reference images not displayed]

FINDINGS: ACR Breast Density Category 3: The breast tissue is heterogeneously
dense.

There is marked skin thickening and trabecular thickening
throughout the right breast.  There is no focal mass or suspicious
type microcalcifications.  The left breast has a normal appearance.

Mammographic images were processed with CAD.

On physical exam, there is diffuse skin thickening of the right
breast.  There is no erythema.  I do not palpate a discrete mass.

Ultrasound is performed, showing there is marked diffuse skin
thickening.  There are edematous changes in the soft tissues of the
breast.  There is no focal mass.  Sonographic evaluation of the
right axilla fails to reveal any enlarged adenopathy.
IMPRESSION: Increased breast density with skin thickening throughout the right
breast.  The differential considerations include inflammatory
breast carcinoma, mastitis, or edema secondary to fluid retention.

RECOMMENDATION:
Surgical consultation with a skin punch biopsy is recommended.

I have discussed the findings and recommendations with the patient.
Results were also provided in writing at the conclusion of the
visit.

BI-RADS CATEGORY 4:  Suspicious abnormality - biopsy should be
considered.

## 2012-04-06 IMAGING — MG MM DIGITAL DIAGNOSTIC BILAT CAD
5 series · 5 of 5 positions shown · non-contrast
Comparison: [DATE] and [DATE]

CLINICAL DATA: 61-year-old HIV positive patient.  The patient
complains of right breast thickening that has been present since
[DATE].  The patient states it is mildly uncomfortable but
denies erythema. The patient gets dialysis for kidney failure.  The
patient does not recall seeing a general surgeon or having a punch
biopsy of the right breast.

DIGITAL DIAGNOSTIC BILATERAL MAMMOGRAM WITH CAD AND RIGHT BREAST
ULTRASOUND:

[L CC]
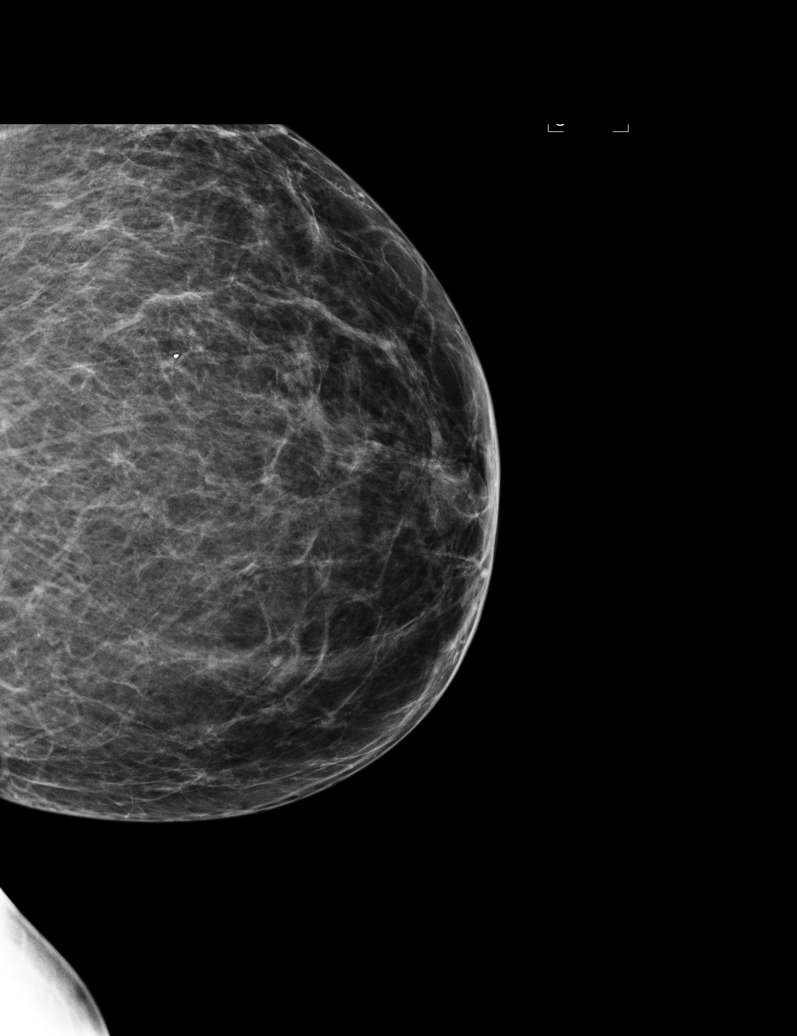

[L MLO]
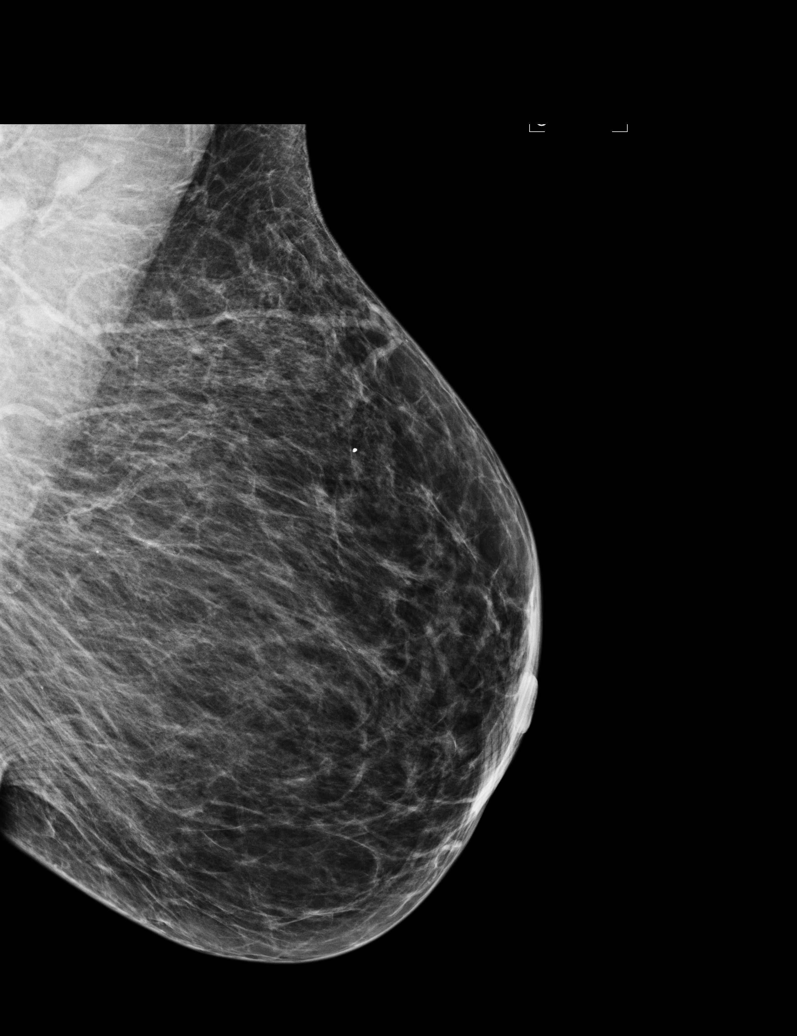

[R CC]
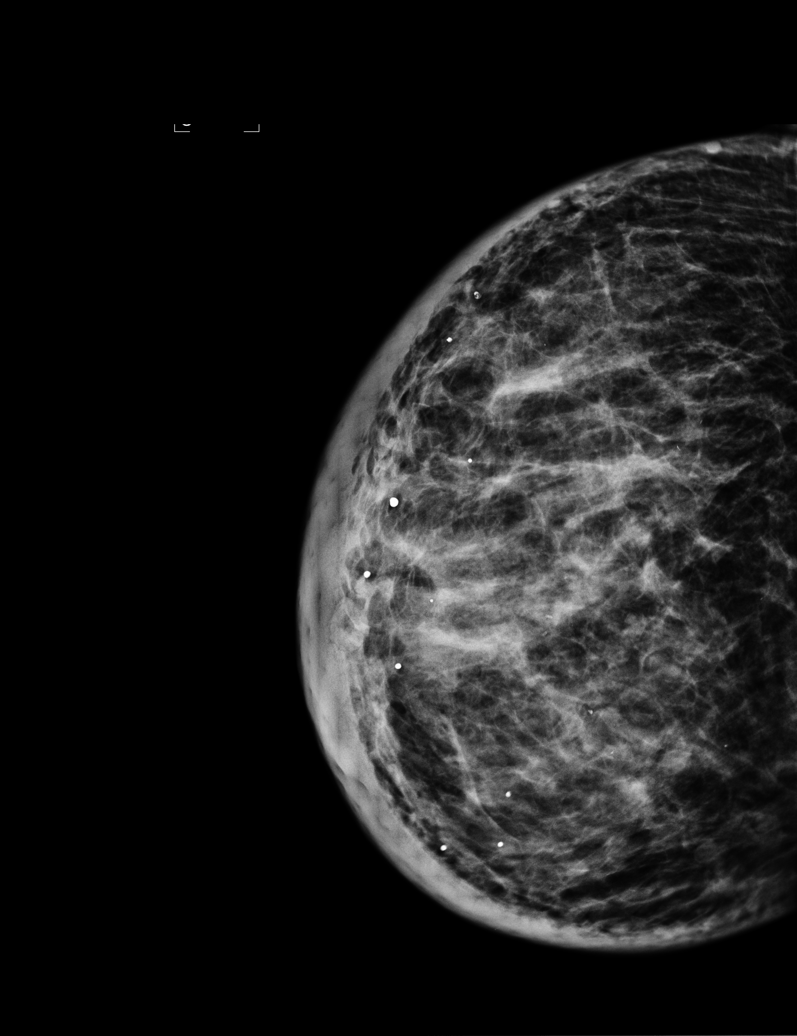

[R MLO (1 of 2)]
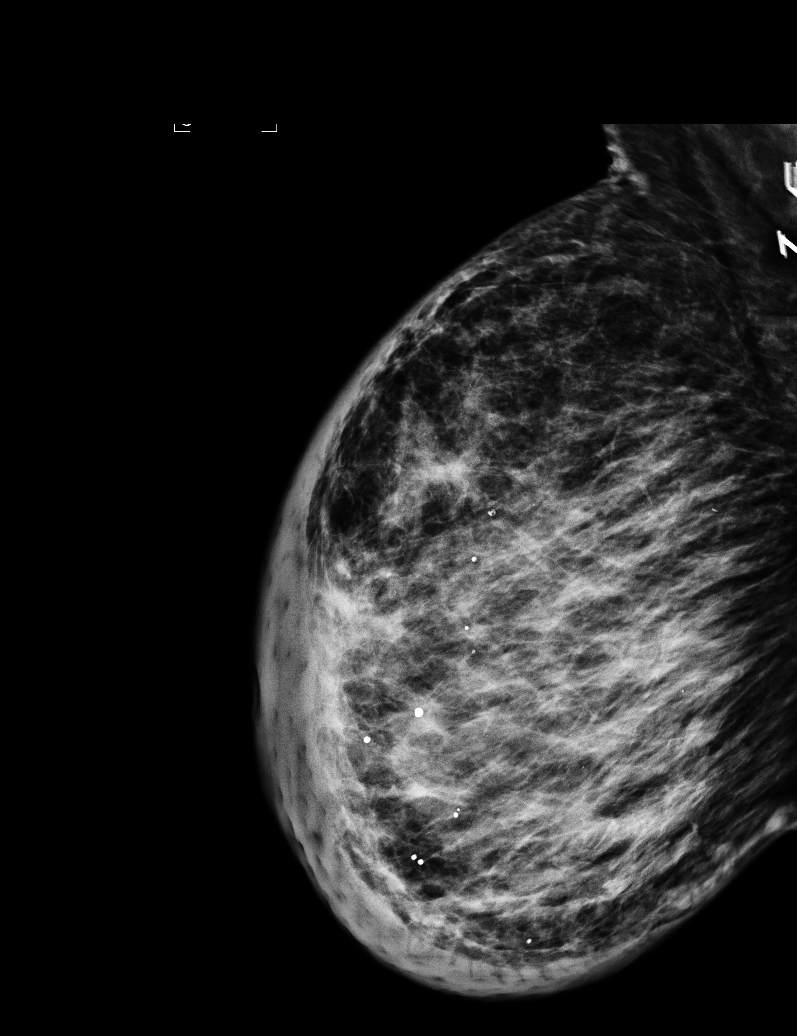

[R MLO (2 of 2)]
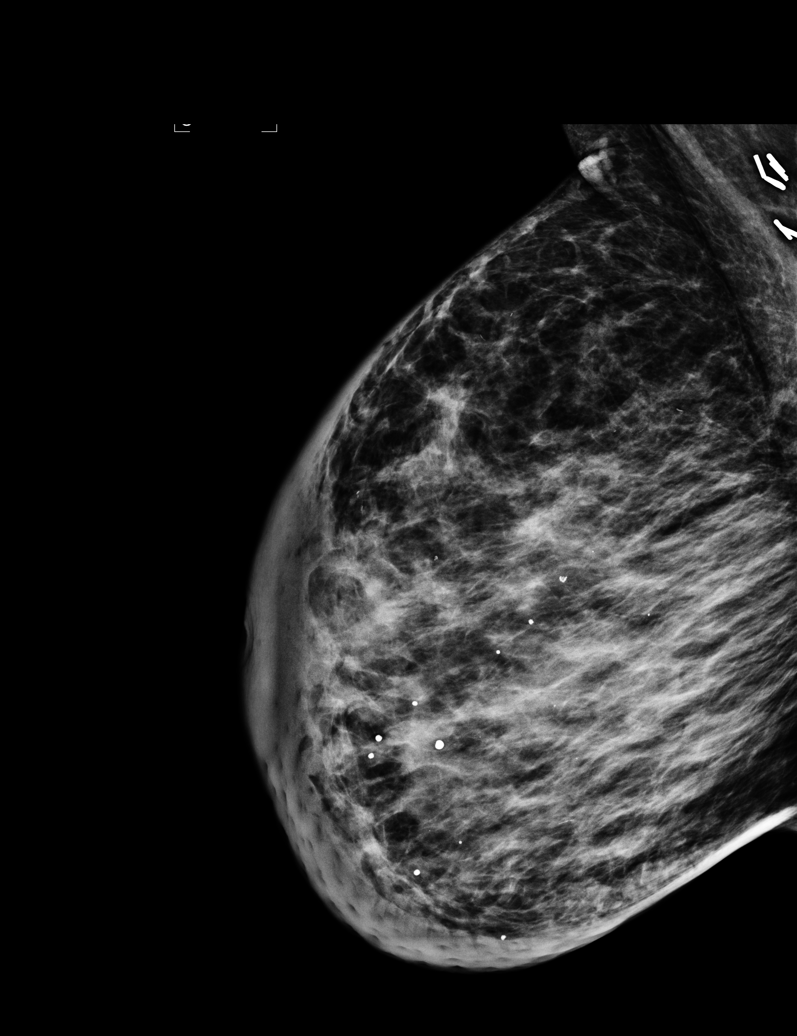

[5 of 5 positions shown; findings below may reference images not displayed]

FINDINGS: ACR Breast Density Category 3: The breast tissue is heterogeneously
dense.

There is marked skin thickening and trabecular thickening
throughout the right breast.  There is no focal mass or suspicious
type microcalcifications.  The left breast has a normal appearance.

Mammographic images were processed with CAD.

On physical exam, there is diffuse skin thickening of the right
breast.  There is no erythema.  I do not palpate a discrete mass.

Ultrasound is performed, showing there is marked diffuse skin
thickening.  There are edematous changes in the soft tissues of the
breast.  There is no focal mass.  Sonographic evaluation of the
right axilla fails to reveal any enlarged adenopathy.
IMPRESSION: Increased breast density with skin thickening throughout the right
breast.  The differential considerations include inflammatory
breast carcinoma, mastitis, or edema secondary to fluid retention.

RECOMMENDATION:
Surgical consultation with a skin punch biopsy is recommended.

I have discussed the findings and recommendations with the patient.
Results were also provided in writing at the conclusion of the
visit.

BI-RADS CATEGORY 4:  Suspicious abnormality - biopsy should be
considered.

## 2012-04-07 DIAGNOSIS — D472 Monoclonal gammopathy: Secondary | ICD-10-CM | POA: Diagnosis not present

## 2012-04-07 DIAGNOSIS — D509 Iron deficiency anemia, unspecified: Secondary | ICD-10-CM | POA: Diagnosis not present

## 2012-04-07 DIAGNOSIS — N186 End stage renal disease: Secondary | ICD-10-CM | POA: Diagnosis not present

## 2012-04-07 DIAGNOSIS — N039 Chronic nephritic syndrome with unspecified morphologic changes: Secondary | ICD-10-CM | POA: Diagnosis not present

## 2012-04-07 DIAGNOSIS — G894 Chronic pain syndrome: Secondary | ICD-10-CM | POA: Diagnosis not present

## 2012-04-07 DIAGNOSIS — N2581 Secondary hyperparathyroidism of renal origin: Secondary | ICD-10-CM | POA: Diagnosis not present

## 2012-04-08 DIAGNOSIS — E049 Nontoxic goiter, unspecified: Secondary | ICD-10-CM | POA: Diagnosis not present

## 2012-04-08 DIAGNOSIS — N2581 Secondary hyperparathyroidism of renal origin: Secondary | ICD-10-CM | POA: Diagnosis not present

## 2012-04-08 DIAGNOSIS — E041 Nontoxic single thyroid nodule: Secondary | ICD-10-CM | POA: Diagnosis not present

## 2012-04-09 DIAGNOSIS — N186 End stage renal disease: Secondary | ICD-10-CM | POA: Diagnosis not present

## 2012-04-09 DIAGNOSIS — N2581 Secondary hyperparathyroidism of renal origin: Secondary | ICD-10-CM | POA: Diagnosis not present

## 2012-04-09 DIAGNOSIS — D472 Monoclonal gammopathy: Secondary | ICD-10-CM | POA: Diagnosis not present

## 2012-04-09 DIAGNOSIS — D509 Iron deficiency anemia, unspecified: Secondary | ICD-10-CM | POA: Diagnosis not present

## 2012-04-09 DIAGNOSIS — D631 Anemia in chronic kidney disease: Secondary | ICD-10-CM | POA: Diagnosis not present

## 2012-04-11 ENCOUNTER — Ambulatory Visit: Payer: Medicare Other | Admitting: Infectious Diseases

## 2012-04-12 DIAGNOSIS — N186 End stage renal disease: Secondary | ICD-10-CM | POA: Diagnosis not present

## 2012-04-12 DIAGNOSIS — Z79899 Other long term (current) drug therapy: Secondary | ICD-10-CM | POA: Diagnosis not present

## 2012-04-12 DIAGNOSIS — E039 Hypothyroidism, unspecified: Secondary | ICD-10-CM | POA: Diagnosis not present

## 2012-04-12 DIAGNOSIS — D631 Anemia in chronic kidney disease: Secondary | ICD-10-CM | POA: Diagnosis not present

## 2012-04-12 DIAGNOSIS — N039 Chronic nephritic syndrome with unspecified morphologic changes: Secondary | ICD-10-CM | POA: Diagnosis not present

## 2012-04-12 DIAGNOSIS — N2581 Secondary hyperparathyroidism of renal origin: Secondary | ICD-10-CM | POA: Diagnosis not present

## 2012-04-12 DIAGNOSIS — D472 Monoclonal gammopathy: Secondary | ICD-10-CM | POA: Diagnosis not present

## 2012-04-12 DIAGNOSIS — D509 Iron deficiency anemia, unspecified: Secondary | ICD-10-CM | POA: Diagnosis not present

## 2012-04-13 ENCOUNTER — Ambulatory Visit: Payer: Medicare Other | Admitting: Infectious Diseases

## 2012-04-17 DIAGNOSIS — D509 Iron deficiency anemia, unspecified: Secondary | ICD-10-CM | POA: Diagnosis not present

## 2012-04-17 DIAGNOSIS — N186 End stage renal disease: Secondary | ICD-10-CM | POA: Diagnosis not present

## 2012-04-17 DIAGNOSIS — D472 Monoclonal gammopathy: Secondary | ICD-10-CM | POA: Diagnosis not present

## 2012-04-17 DIAGNOSIS — N2581 Secondary hyperparathyroidism of renal origin: Secondary | ICD-10-CM | POA: Diagnosis not present

## 2012-04-17 DIAGNOSIS — D631 Anemia in chronic kidney disease: Secondary | ICD-10-CM | POA: Diagnosis not present

## 2012-04-19 DIAGNOSIS — N2581 Secondary hyperparathyroidism of renal origin: Secondary | ICD-10-CM | POA: Diagnosis not present

## 2012-04-19 DIAGNOSIS — D509 Iron deficiency anemia, unspecified: Secondary | ICD-10-CM | POA: Diagnosis not present

## 2012-04-19 DIAGNOSIS — D472 Monoclonal gammopathy: Secondary | ICD-10-CM | POA: Diagnosis not present

## 2012-04-19 DIAGNOSIS — N186 End stage renal disease: Secondary | ICD-10-CM | POA: Diagnosis not present

## 2012-04-19 DIAGNOSIS — D631 Anemia in chronic kidney disease: Secondary | ICD-10-CM | POA: Diagnosis not present

## 2012-04-21 DIAGNOSIS — D509 Iron deficiency anemia, unspecified: Secondary | ICD-10-CM | POA: Diagnosis not present

## 2012-04-23 DIAGNOSIS — N2581 Secondary hyperparathyroidism of renal origin: Secondary | ICD-10-CM | POA: Diagnosis not present

## 2012-04-23 DIAGNOSIS — D472 Monoclonal gammopathy: Secondary | ICD-10-CM | POA: Diagnosis not present

## 2012-04-23 DIAGNOSIS — N039 Chronic nephritic syndrome with unspecified morphologic changes: Secondary | ICD-10-CM | POA: Diagnosis not present

## 2012-04-23 DIAGNOSIS — N186 End stage renal disease: Secondary | ICD-10-CM | POA: Diagnosis not present

## 2012-04-23 DIAGNOSIS — D509 Iron deficiency anemia, unspecified: Secondary | ICD-10-CM | POA: Diagnosis not present

## 2012-04-25 DIAGNOSIS — N186 End stage renal disease: Secondary | ICD-10-CM | POA: Diagnosis not present

## 2012-04-25 DIAGNOSIS — I871 Compression of vein: Secondary | ICD-10-CM | POA: Diagnosis not present

## 2012-04-25 DIAGNOSIS — T82898A Other specified complication of vascular prosthetic devices, implants and grafts, initial encounter: Secondary | ICD-10-CM | POA: Diagnosis not present

## 2012-04-26 DIAGNOSIS — D509 Iron deficiency anemia, unspecified: Secondary | ICD-10-CM | POA: Diagnosis not present

## 2012-04-26 DIAGNOSIS — N039 Chronic nephritic syndrome with unspecified morphologic changes: Secondary | ICD-10-CM | POA: Diagnosis not present

## 2012-04-26 DIAGNOSIS — N186 End stage renal disease: Secondary | ICD-10-CM | POA: Diagnosis not present

## 2012-04-26 DIAGNOSIS — D472 Monoclonal gammopathy: Secondary | ICD-10-CM | POA: Diagnosis not present

## 2012-04-27 ENCOUNTER — Encounter (HOSPITAL_COMMUNITY)
Admission: RE | Admit: 2012-04-27 | Discharge: 2012-04-27 | Disposition: A | Discharge status: Home / Self Care | Payer: Medicare Other | Source: Ambulatory Visit | Attending: General Surgery | Admitting: General Surgery

## 2012-04-27 ENCOUNTER — Encounter (HOSPITAL_COMMUNITY): Payer: Self-pay

## 2012-04-27 ENCOUNTER — Encounter (HOSPITAL_COMMUNITY): Payer: Self-pay | Admitting: Pharmacy Technician

## 2012-04-27 HISTORY — DX: Muscle weakness (generalized): M62.81

## 2012-04-27 HISTORY — DX: Nontraumatic intracranial hemorrhage, unspecified: I62.9

## 2012-04-27 HISTORY — DX: Anemia, unspecified: D64.9

## 2012-04-27 HISTORY — DX: Anxiety disorder, unspecified: F41.9

## 2012-04-27 MED ORDER — CHLORHEXIDINE GLUCONATE 4 % EX LIQD: 1.0000 "application " | Freq: Once | CUTANEOUS | Status: DC

## 2012-04-27 NOTE — Patient Instructions (Signed)
[name]  04/27/2012   Your procedure is scheduled on:  05/04/12  Report to [facility] at 06:15 AM.  Call this number if you have problems the morning of surgery: [phone]   Remember:   Do not eat food or drink liquids after midnight.   Take these medicines the morning of surgery with A SIP OF WATER: Amlodipine, Epivir, Protonix, Isentress, Revatio and Retrovir. You may take Ativan and Oxycodone only if needed. Use the Duoneb nebulizer.   Do not wear jewelry, make-up or nail polish.  Do not wear lotions, powders, or perfumes. You may wear deodorant.  Do not shave 48 hours prior to surgery. Men may shave face and neck.  Do not bring valuables to the hospital.  Contacts, dentures or bridgework may not be worn into surgery.  Leave suitcase in the car. After surgery it may be brought to your room.  For patients admitted to the hospital, checkout time is 11:00 AM the day of  discharge.   Patients discharged the day of surgery will not be allowed to drive  home.  Name and phone number of your driver: [facility]  Special Instructions: Shower using CHG 2 nights before surgery and the night before surgery.  If you shower the day of surgery use CHG.  Use special wash - you have one bottle of CHG for all showers.  You should use approximately 1/3 of the bottle for each shower.   Please read over the following fact sheets that you were given: Pain Booklet, MRSA Information, Surgical Site Infection Prevention, Anesthesia Post-op Instructions and Care and Recovery After Surgery    Breast Biopsy A breast biopsy is a procedure where a sample of breast tissue is removed from your breast. The tissue is examined under a microscope to see if cancerous cells are present. A breast biopsy is done when there is:  Any undiagnosed breast mass (tumor).  Nipple abnormalities, dimpling, crusting, or ulcerations.  Abnormal discharge from the nipple, especially blood.  Redness, swelling,  and pain of the breast.  Calcium deposits (calcifications) or abnormalities seen on a mammogram, ultrasound result, or results of magnetic resonance imaging (MRI).  Suspicious changes in the breast seen on your mammogram. If the tumor is found to be cancerous (malignant), a breast biopsy can help to determine what the best treatment is for you. There are many different types of breast biopsies. Talk to your caregiver about your options and which type is best for you. LET YOUR CAREGIVER KNOW ABOUT:  Allergies to food or medicine.  Medicines taken, including vitamins, herbs, eyedrops, over-the-counter medicines, and creams.  Use of steroids (by mouth or creams).  Previous problems with anesthetics or numbing medicines.  History of bleeding problems or blood clots.  Previous surgery.  Other health problems, including diabetes and kidney problems.  Any recent colds or infections.  Possibility of pregnancy, if this applies. RISKS AND COMPLICATIONS   Bleeding.  Infection.  Allergy to medicines.  Bruising and swelling of the breast.  Alteration in the shape of the breast.  Not finding the lump or abnormality.  Needing more surgery. BEFORE THE PROCEDURE  Arrange for someone to drive you home after the procedure.  Do not smoke for 2 weeks before the procedure. Stop smoking, if you smoke.  Do not drink alcohol for 24 hours before procedure.  Wear a good support bra to the procedure. PROCEDURE  You may be given a medicine to numb the breast area (local anesthesia)  or a medicine to make you sleep (general anesthesia) during the procedure. The following are the different types of biopsies that can be performed.   Fine-needle aspiration A thin needle is attached to a syringe and inserted into the breast lump. Fluid and cells are removed and then looked at under a microscope. If the breast lump cannot be felt, an ultrasound may be used to help locate the lump and place the needle  in the correct area.   Core needle biopsy A wide, hollow needle (core needle) is inserted into the breast lump 3 6 times to get tissue samples or cores. The samples are removed. The needle is usually placed in the correct area by using an ultrasound or X-ray.   Stereotactic biopsy X-ray equipment and a computer are used to analyze X-ray pictures of the breast lump. The computer then finds exactly where the core needle needs to be inserted. Tissue samples are removed.   Vacuum-assisted biopsy A small incision (less than  inch) is made in your breast. A biopsy device that includes a hollow needle and vacuum is passed through the incision and into the breast tissue. The vacuum gently draws abnormal breast tissue into the needle to remove it. This type of biopsy removes a larger tissue sample than a regular core needle biopsy. No stitches are needed, and there is usually little scarring.  Ultrasound-guided core needle biopsy A high frequency ultrasound helps guide the core needle to the area of the mass or abnormality. An incision is made to insert the needle. Tissue samples are removed.  Open biopsy A larger incision is made in the breast. Your caregiver will attempt to remove the whole breast lump or as much as possible. AFTER THE PROCEDURE  You will be taken to the recovery area. If you are doing well and have no problems, you will be allowed to go home.  You may notice bruising on your breast. This is normal.  Your caregiver may apply a pressure dressing on your breast for 24 48 hours. A pressure dressing is a bandage that is wrapped tightly around the chest to stop fluid from collecting underneath tissues. Document Released: 02/16/2005 Document Revised: 08/18/2011 Document Reviewed: 03/19/2011 [facility] Patient Information 2013 [place].    PATIENT INSTRUCTIONS POST-ANESTHESIA  IMMEDIATELY FOLLOWING SURGERY:  Do not drive or operate machinery for the first twenty four hours after  surgery.  Do not make any important decisions for twenty four hours after surgery or while taking narcotic pain medications or sedatives.  If you develop intractable nausea and vomiting or a severe headache please notify your doctor immediately.  FOLLOW-UP:  Please make an appointment with your surgeon as instructed. You do not need to follow up with anesthesia unless specifically instructed to do so.  WOUND CARE INSTRUCTIONS (if applicable):  Keep a dry clean dressing on the anesthesia/puncture wound site if there is drainage.  Once the wound has quit draining you may leave it open to air.  Generally you should leave the bandage intact for twenty four hours unless there is drainage.  If the epidural site drains for more than 36-48 hours please call the anesthesia department.  QUESTIONS?:  Please feel free to call your physician or the hospital operator if you have any questions, and they will be happy to assist you.

## 2012-04-28 DIAGNOSIS — N186 End stage renal disease: Secondary | ICD-10-CM | POA: Diagnosis not present

## 2012-04-28 DIAGNOSIS — D472 Monoclonal gammopathy: Secondary | ICD-10-CM | POA: Diagnosis not present

## 2012-04-28 IMAGING — CR DG ELBOW 2V*R*
2 series · 2 of 2 positions shown · non-contrast
Comparison: None.

CLINICAL DATA: Fall, pain.

RIGHT ELBOW - 2 VIEW

[x elbow lat right (1 of 2)]
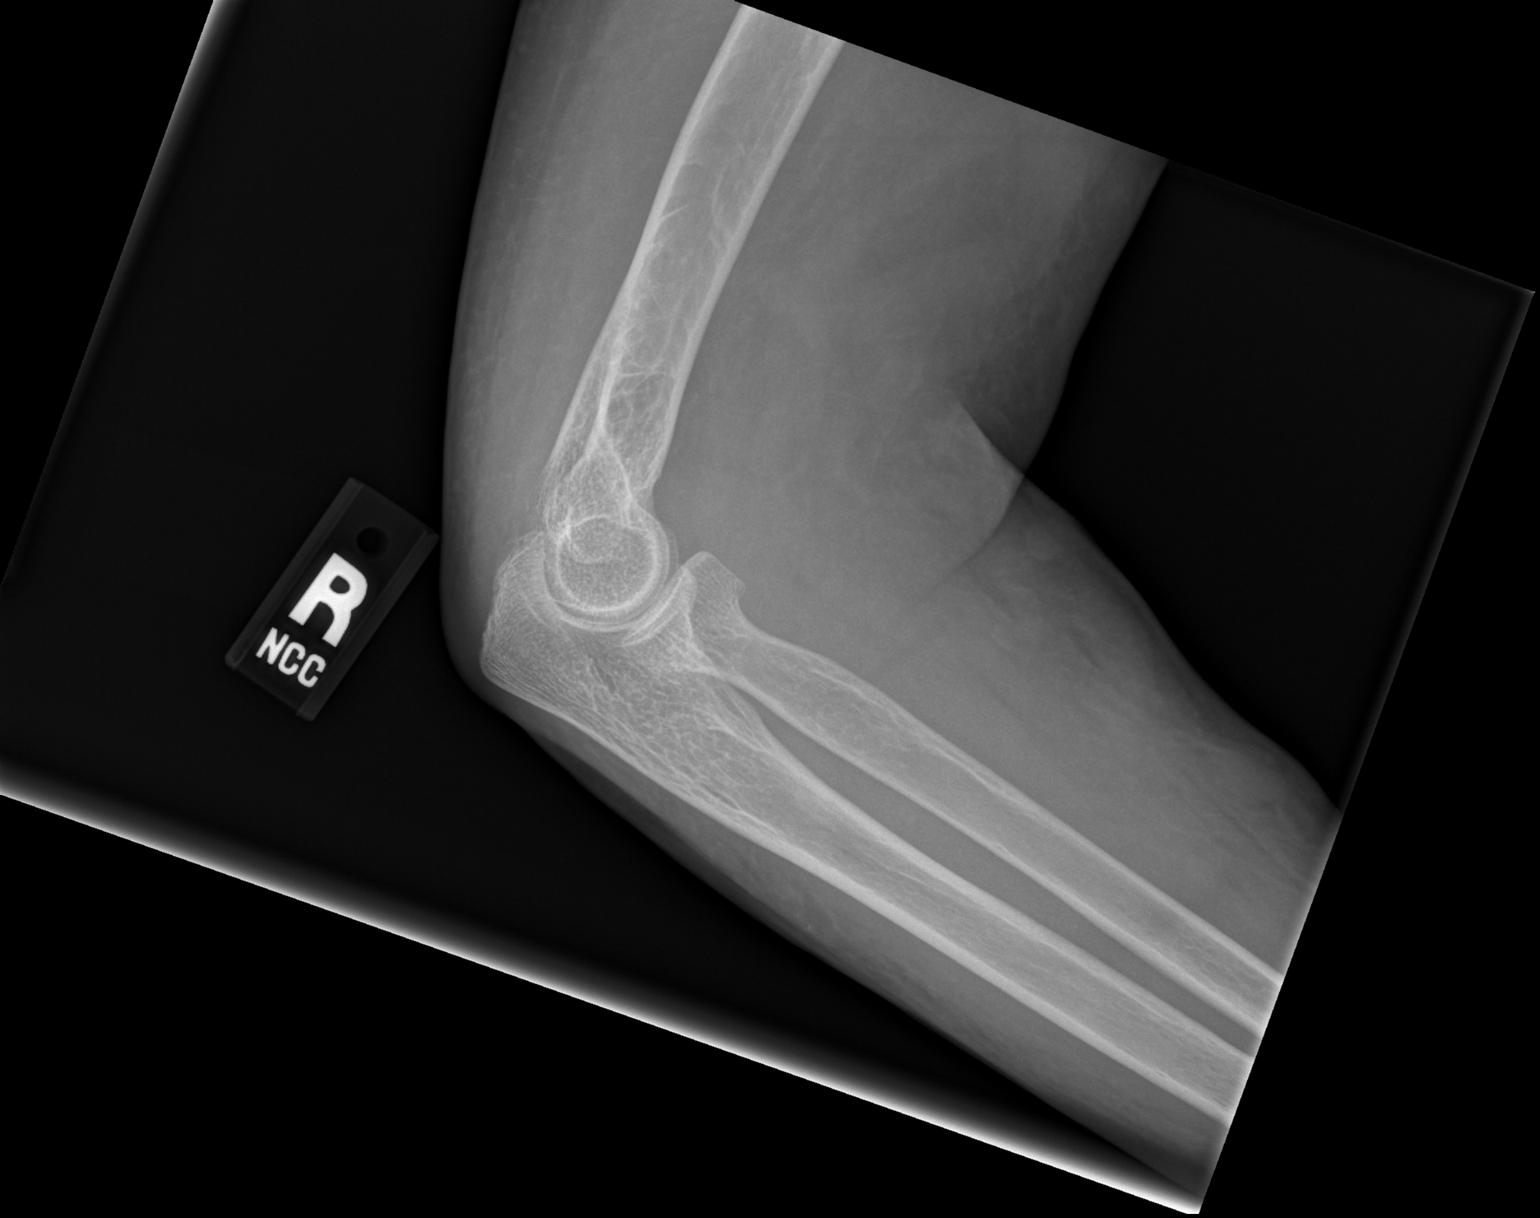

[x elbow lat right (2 of 2)]
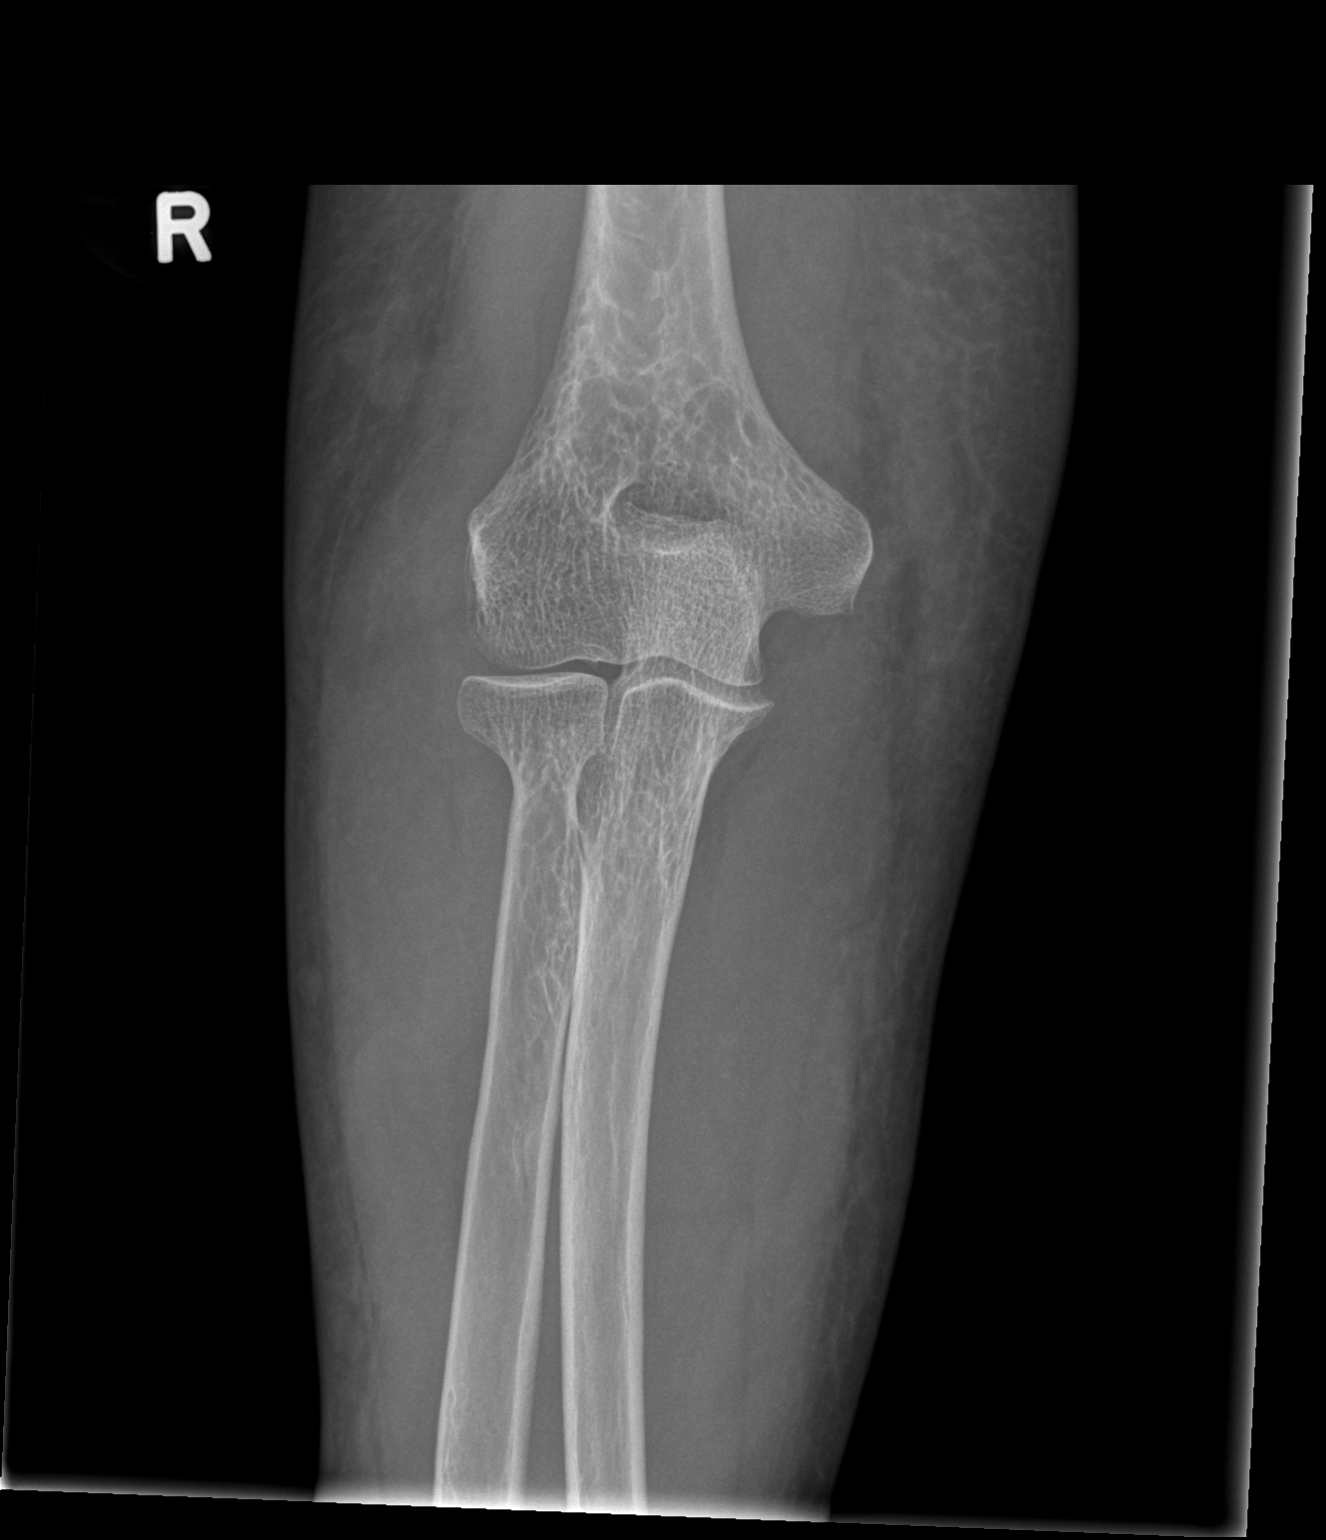

[2 of 2 positions shown; findings below may reference images not displayed]

FINDINGS: Imaged bones, joints and soft tissues appear normal.
IMPRESSION: Negative exam.

## 2012-04-29 DIAGNOSIS — E041 Nontoxic single thyroid nodule: Secondary | ICD-10-CM | POA: Diagnosis not present

## 2012-04-29 DIAGNOSIS — N186 End stage renal disease: Secondary | ICD-10-CM | POA: Diagnosis not present

## 2012-05-02 ENCOUNTER — Encounter: Payer: Self-pay | Admitting: Infectious Diseases

## 2012-05-02 ENCOUNTER — Ambulatory Visit (INDEPENDENT_AMBULATORY_CARE_PROVIDER_SITE_OTHER): Payer: Medicare Other | Admitting: Infectious Diseases

## 2012-05-02 VITALS — BP 151/86 | HR 71 | Temp 98.1°F | Ht 62.0 in | Wt 115.0 lb

## 2012-05-02 DIAGNOSIS — Z23 Encounter for immunization: Secondary | ICD-10-CM

## 2012-05-02 DIAGNOSIS — R599 Enlarged lymph nodes, unspecified: Secondary | ICD-10-CM

## 2012-05-02 DIAGNOSIS — B20 Human immunodeficiency virus [HIV] disease: Secondary | ICD-10-CM

## 2012-05-02 DIAGNOSIS — I4891 Unspecified atrial fibrillation: Secondary | ICD-10-CM

## 2012-05-02 NOTE — Assessment & Plan Note (Signed)
Await path from her Bx.

## 2012-05-02 NOTE — Assessment & Plan Note (Signed)
She is doing well. Will continue her ART at its current dose. Her flu and pnvx are uptodate, she is Hep B S Ab+. Will plan to see her back in 6 months with labs prior. She needs RPR and lipids. With next blood draw.

## 2012-05-02 NOTE — Assessment & Plan Note (Signed)
Suspect she has afib. Will defer this w/u to her PCP.

## 2012-05-02 NOTE — Progress Notes (Signed)
  Subjective:    Patient ID: [name], female    DOB: 02-Apr-1950, 62 y.o.   MRN: [id number]  HPI 62 yo F with HIV+, ESRD, staying at SNF. She is on ISN/AZT/3TC dosed for her HD. She had labs at HD- CD4 257 and VL <20 (04-05-12).  "I'm exhausted". Been for neck LN Bx (2-26) and breast bx sched (for 2-5). Has not noticed any further lumps.  No problems with her medications. No problems with her fistula. Not eating well, no appetite. Moving bowels well, making some urine. Likes where she is staying.    Review of Systems  Constitutional: Negative for fever, chills, appetite change and unexpected weight change.  Cardiovascular: Negative for chest pain.  Gastrointestinal: Negative for diarrhea and constipation.  Genitourinary: Negative for dysuria.  Neurological: Positive for headaches.       Objective:   Physical Exam  Constitutional: She appears well-developed and well-nourished.  HENT:  Mouth/Throat: No oropharyngeal exudate.  Eyes: EOM are normal. Pupils are equal, round, and reactive to light.  Cardiovascular: Normal rate.  An irregularly irregular rhythm present.  Murmur heard.  Crescendo systolic murmur is present with a grade of 2/6  Pulmonary/Chest: Effort normal and breath sounds normal.  Abdominal: Soft. Bowel sounds are normal. There is no tenderness.  Musculoskeletal: She exhibits no edema.  RUE fistula is clean, non-tender, +bruit.           Assessment & Plan:

## 2012-05-04 ENCOUNTER — Ambulatory Visit (HOSPITAL_COMMUNITY): Payer: Medicare Other | Admitting: Anesthesiology

## 2012-05-04 ENCOUNTER — Encounter (HOSPITAL_COMMUNITY)
Admission: RE | Disposition: A | Discharge status: Home / Self Care | Payer: Self-pay | Source: Ambulatory Visit | Attending: General Surgery

## 2012-05-04 ENCOUNTER — Encounter (HOSPITAL_COMMUNITY): Payer: Self-pay | Admitting: Anesthesiology

## 2012-05-04 ENCOUNTER — Ambulatory Visit (HOSPITAL_COMMUNITY)
Admission: RE | Admit: 2012-05-04 | Discharge: 2012-05-04 | Disposition: A | Discharge status: Home / Self Care | Payer: Medicare Other | Source: Ambulatory Visit | Attending: General Surgery | Admitting: General Surgery

## 2012-05-04 ENCOUNTER — Encounter (HOSPITAL_COMMUNITY): Payer: Self-pay | Admitting: *Deleted

## 2012-05-04 DIAGNOSIS — Z79899 Other long term (current) drug therapy: Secondary | ICD-10-CM | POA: Insufficient documentation

## 2012-05-04 DIAGNOSIS — J4489 Other specified chronic obstructive pulmonary disease: Secondary | ICD-10-CM | POA: Insufficient documentation

## 2012-05-04 DIAGNOSIS — Z992 Dependence on renal dialysis: Secondary | ICD-10-CM | POA: Insufficient documentation

## 2012-05-04 DIAGNOSIS — N61 Mastitis without abscess: Secondary | ICD-10-CM | POA: Insufficient documentation

## 2012-05-04 DIAGNOSIS — N186 End stage renal disease: Secondary | ICD-10-CM | POA: Insufficient documentation

## 2012-05-04 DIAGNOSIS — B20 Human immunodeficiency virus [HIV] disease: Secondary | ICD-10-CM | POA: Insufficient documentation

## 2012-05-04 DIAGNOSIS — J449 Chronic obstructive pulmonary disease, unspecified: Secondary | ICD-10-CM | POA: Insufficient documentation

## 2012-05-04 DIAGNOSIS — N63 Unspecified lump in unspecified breast: Secondary | ICD-10-CM | POA: Insufficient documentation

## 2012-05-04 DIAGNOSIS — Z01812 Encounter for preprocedural laboratory examination: Secondary | ICD-10-CM | POA: Insufficient documentation

## 2012-05-04 DIAGNOSIS — N6310 Unspecified lump in the right breast, unspecified quadrant: Secondary | ICD-10-CM

## 2012-05-04 HISTORY — PX: EXCISION OF BREAST BIOPSY: SHX5822

## 2012-05-04 LAB — POCT I-STAT 4, (NA,K, GLUC, HGB,HCT)
Glucose, Bld: 76 mg/dL (ref 70–99)
Hemoglobin: 16.7 g/dL — ABNORMAL HIGH (ref 12.0–15.0)
Potassium: 3.5 mEq/L (ref 3.5–5.1)

## 2012-05-04 SURGERY — EXCISION OF BREAST BIOPSY
Anesthesia: General | Site: Breast | Laterality: Right | Wound class: Clean

## 2012-05-04 MED ORDER — MIDAZOLAM HCL 2 MG/2ML IJ SOLN
INTRAMUSCULAR | Status: AC
  Filled 2012-05-04: qty 2

## 2012-05-04 MED ORDER — OXYCODONE HCL 5 MG PO TABS: 5.0000 mg | ORAL_TABLET | Freq: Every day | ORAL | Status: DC

## 2012-05-04 MED ORDER — LACTATED RINGERS IV SOLN: INTRAVENOUS | Status: DC

## 2012-05-04 MED ORDER — SODIUM CHLORIDE 0.9 % IV SOLN
INTRAVENOUS | Status: DC
  Administered 2012-05-04: 07:00:00 via INTRAVENOUS

## 2012-05-04 MED ORDER — LIDOCAINE HCL (PF) 1 % IJ SOLN
INTRAMUSCULAR | Status: AC
  Filled 2012-05-04: qty 30

## 2012-05-04 MED ORDER — FENTANYL CITRATE 0.05 MG/ML IJ SOLN
25.0000 ug | INTRAMUSCULAR | Status: DC | PRN
  Administered 2012-05-04: 25 ug via INTRAVENOUS

## 2012-05-04 MED ORDER — FENTANYL CITRATE 0.05 MG/ML IJ SOLN
INTRAMUSCULAR | Status: AC
  Filled 2012-05-04: qty 2

## 2012-05-04 MED ORDER — PROPOFOL 10 MG/ML IV EMUL
INTRAVENOUS | Status: DC | PRN
  Administered 2012-05-04: 50 mg via INTRAVENOUS
  Administered 2012-05-04: 150 mg via INTRAVENOUS

## 2012-05-04 MED ORDER — FENTANYL CITRATE 0.05 MG/ML IJ SOLN
25.0000 ug | INTRAMUSCULAR | Status: DC | PRN
  Administered 2012-05-04 (×2): 50 ug via INTRAVENOUS

## 2012-05-04 MED ORDER — BUPIVACAINE HCL (PF) 0.5 % IJ SOLN
INTRAMUSCULAR | Status: DC | PRN
  Administered 2012-05-04: 10 mL

## 2012-05-04 MED ORDER — ENOXAPARIN SODIUM 40 MG/0.4ML ~~LOC~~ SOLN
40.0000 mg | Freq: Once | SUBCUTANEOUS | Status: AC
  Administered 2012-05-04: 40 mg via SUBCUTANEOUS

## 2012-05-04 MED ORDER — BUPIVACAINE HCL (PF) 0.5 % IJ SOLN
INTRAMUSCULAR | Status: AC
  Filled 2012-05-04: qty 30

## 2012-05-04 MED ORDER — MIDAZOLAM HCL 2 MG/2ML IJ SOLN
1.0000 mg | INTRAMUSCULAR | Status: DC | PRN
  Administered 2012-05-04 (×2): 2 mg via INTRAVENOUS

## 2012-05-04 MED ORDER — CIPROFLOXACIN IN D5W 400 MG/200ML IV SOLN
INTRAVENOUS | Status: AC
  Filled 2012-05-04: qty 200

## 2012-05-04 MED ORDER — ENOXAPARIN SODIUM 40 MG/0.4ML ~~LOC~~ SOLN
SUBCUTANEOUS | Status: AC
  Filled 2012-05-04: qty 0.4

## 2012-05-04 MED ORDER — ONDANSETRON HCL 4 MG/2ML IJ SOLN: 4.0000 mg | Freq: Once | INTRAMUSCULAR | Status: DC | PRN

## 2012-05-04 MED ORDER — CIPROFLOXACIN IN D5W 400 MG/200ML IV SOLN: 400.0000 mg | INTRAVENOUS | Status: DC

## 2012-05-04 MED ORDER — PROPOFOL 10 MG/ML IV EMUL
INTRAVENOUS | Status: AC
  Filled 2012-05-04: qty 20

## 2012-05-04 MED ORDER — 0.9 % SODIUM CHLORIDE (POUR BTL) OPTIME
TOPICAL | Status: DC | PRN
  Administered 2012-05-04: 1000 mL

## 2012-05-04 MED ORDER — LIDOCAINE HCL (CARDIAC) 10 MG/ML IV SOLN
INTRAVENOUS | Status: DC | PRN
  Administered 2012-05-04: 50 mg via INTRAVENOUS

## 2012-05-04 MED ORDER — LIDOCAINE HCL (PF) 1 % IJ SOLN
INTRAMUSCULAR | Status: AC
  Filled 2012-05-04: qty 5

## 2012-05-04 MED ORDER — FENTANYL CITRATE 0.05 MG/ML IJ SOLN
INTRAMUSCULAR | Status: DC | PRN
  Administered 2012-05-04 (×2): 25 ug via INTRAVENOUS
  Administered 2012-05-04: 50 ug via INTRAVENOUS
  Administered 2012-05-04 (×3): 25 ug via INTRAVENOUS

## 2012-05-04 MED ORDER — CIPROFLOXACIN IN D5W 400 MG/200ML IV SOLN
INTRAVENOUS | Status: DC | PRN
  Administered 2012-05-04: 400 mg via INTRAVENOUS

## 2012-05-04 SURGICAL SUPPLY — 32 items
BAG HAMPER (MISCELLANEOUS) ×2 IMPLANT
BENZOIN TINCTURE PRP APPL 2/3 (GAUZE/BANDAGES/DRESSINGS) ×2 IMPLANT
CLOTH BEACON ORANGE TIMEOUT ST (SAFETY) ×2 IMPLANT
COVER LIGHT HANDLE STERIS (MISCELLANEOUS) ×4 IMPLANT
DURAPREP 26ML APPLICATOR (WOUND CARE) ×2 IMPLANT
ELECT REM PT RETURN 9FT ADLT (ELECTROSURGICAL) ×2
ELECTRODE REM PT RTRN 9FT ADLT (ELECTROSURGICAL) ×1 IMPLANT
FORMALIN 10 PREFIL 120ML (MISCELLANEOUS) ×2 IMPLANT
GLOVE BIOGEL PI IND STRL 7.5 (GLOVE) ×1 IMPLANT
GLOVE BIOGEL PI INDICATOR 7.5 (GLOVE) ×1
GLOVE ECLIPSE 7.0 STRL STRAW (GLOVE) IMPLANT
GLOVE EXAM NITRILE MD LF STRL (GLOVE) ×2 IMPLANT
GLOVE INDICATOR 6.5 STRL GRN (GLOVE) ×2 IMPLANT
GLOVE INDICATOR 7.0 STRL GRN (GLOVE) ×4 IMPLANT
GLOVE SKINSENSE NS SZ6.5 (GLOVE) ×1
GLOVE SKINSENSE STRL SZ6.5 (GLOVE) ×1 IMPLANT
GOWN STRL REIN XL XLG (GOWN DISPOSABLE) ×6 IMPLANT
KIT ROOM TURNOVER APOR (KITS) ×2 IMPLANT
MANIFOLD NEPTUNE II (INSTRUMENTS) ×2 IMPLANT
NEEDLE HYPO 18GX1.5 BLUNT FILL (NEEDLE) IMPLANT
NEEDLE HYPO 25X1 1.5 SAFETY (NEEDLE) ×2 IMPLANT
NS IRRIG 1000ML POUR BTL (IV SOLUTION) ×2 IMPLANT
PACK MINOR (CUSTOM PROCEDURE TRAY) ×2 IMPLANT
PAD ARMBOARD 7.5X6 YLW CONV (MISCELLANEOUS) ×2 IMPLANT
SET BASIN LINEN APH (SET/KITS/TRAYS/PACK) ×2 IMPLANT
SPONGE GAUZE 2X2 8PLY STRL LF (GAUZE/BANDAGES/DRESSINGS) IMPLANT
STRIP CLOSURE SKIN 1/2X4 (GAUZE/BANDAGES/DRESSINGS) ×2 IMPLANT
SUT MNCRL AB 4-0 PS2 18 (SUTURE) ×2 IMPLANT
SUT VIC AB 3-0 SH 27 (SUTURE)
SUT VIC AB 3-0 SH 27X BRD (SUTURE) IMPLANT
SYR BULB IRRIGATION 50ML (SYRINGE) ×2 IMPLANT
SYR CONTROL 10ML LL (SYRINGE) ×2 IMPLANT

## 2012-05-04 NOTE — Anesthesia Preprocedure Evaluation (Signed)
Anesthesia Evaluation  Patient identified by MRN, date of birth, ID band  Reviewed: Allergy & Precautions, H&P , NPO status , Patient's Chart, lab work & pertinent test results  Airway Mallampati: II TM Distance: >3 FB     Dental  (+) Edentulous Upper and Edentulous Lower   Pulmonary COPDformer smoker,  breath sounds clear to auscultation        Cardiovascular hypertension, +CHF + dysrhythmias Rhythm:Regular Rate:Normal     Neuro/Psych  Headaches, PSYCHIATRIC DISORDERS Anxiety Depression    GI/Hepatic PUD, Medicated and Controlled,(+)     substance abuse  alcohol use,   Endo/Other    Renal/GU ESRFRenal disease     Musculoskeletal   Abdominal   Peds  Hematology  (+) Blood dyscrasia (HIV Positive), anemia ,   Anesthesia Other Findings   Reproductive/Obstetrics                           Anesthesia Physical Anesthesia Plan  ASA: III  Anesthesia Plan: General   Post-op Pain Management:    Induction: Intravenous  Airway Management Planned: LMA  Additional Equipment:   Intra-op Plan:   Post-operative Plan: Extubation in OR  Informed Consent: I have reviewed the patients History and Physical, chart, labs and discussed the procedure including the risks, benefits and alternatives for the proposed anesthesia with the patient or authorized representative who has indicated his/her understanding and acceptance.     Plan Discussed with:   Anesthesia Plan Comments:         Anesthesia Quick Evaluation

## 2012-05-04 NOTE — Anesthesia Postprocedure Evaluation (Signed)
  Anesthesia Post-op Note  Patient: [name]  Procedure(s) Performed: Procedure(s) with comments: EXCISION OF BREAST BIOPSY (Right) - Right Excisional Breast Biopsy  Patient Location: PACU  Anesthesia Type:General  Level of Consciousness: awake, alert , oriented and patient cooperative  Airway and Oxygen Therapy: Patient Spontanous Breathing and Patient connected to face mask oxygen  Post-op Pain: none  Post-op Assessment: Post-op Vital signs reviewed, Patient's Cardiovascular Status Stable, Respiratory Function Stable, Patent Airway and No signs of Nausea or vomiting  Post-op Vital Signs: Reviewed and stable  Complications: No apparent anesthesia complications

## 2012-05-04 NOTE — Op Note (Signed)
Patient:  [name]  DOB:  05-17-1950  MRN:  [id number]   Preop Diagnosis:  Right-sided breast mass and edema  Postop Diagnosis:  Same  Procedure:  Excisional breast biopsy right-sided.  Surgeon:  Dr. Chelsea Primus  Anes:  General endotracheal, 0.5% Sensorcaine for local  Indications:  Patient is a 62 year old female presented my office with a history of right-sided breast swelling and edema. She had a workup which was suspicious for inflammatory changes within the breast. Clinically patient did not demonstrate any evidence of inflammation however there was noted edema in thickness in the right lateral breast both upper and lower quadrants. Surgical options have been discussed and patient wished to proceed with biopsy for confirmation of any potential underlying malignant process. Risks benefits and alternatives of excisional biopsy were discussed at length the patient including but not limited to risk of bleeding, infection, requirement return to the operating room for definitive operation. Her questions and concerns are addressed the patient as consented for the planned procedure.  Procedure note:  Patient is taken to the operator is placed in supine position the or table time the general anesthetic is a Company secretary. Once patient was asleep symmetrically intubated a nurse anesthetist. At this point her right breast and chest wall is prepped with DuraPrep solution and draped in standard fashion. Time out was performed. Local anesthetic is instilled around the periareolar region. A marking pen is utilized to [place] the planned periareolar incision. A 15 blade scalpel utilized to create the incision. Additional dissection down to subcuticular tissues carried out using electrocautery. The area of palpable thickness is grasped a house clamp and elevated. An approximate 2 x 2 centimeter biopsy specimen is removed it is placed in the back table as a permanent specimen. 2 additional tissue biopsies are  obtained adjacent to this area. No other abnormalities or nodularities noted. Grossly in the operating room the tissue removed did not have the appearance of any tumor however no other areas of thickening were noted and I am confident that the area in question was biopsied. Hemostasis was obtained with electrocautery. I did palpate the breast again not appreciate any additional edematous or thickened areas in any other quadrants. The surgical was palpated and again no other areas of thickness were identified. At this time I turned my attention to closure.  The wound is irrigated with sterile saline. Returning aspirate was clear. Hemostasis is excellent. The deep subcuticular tissues reapproximated using a 3-0 Vicryl in simple or fashion. The skin edges were reapproximated using a 4 Monocryl in a running subcuticular suture. Skin was washed dried moist dry towel. Benzoin is applied around incision. Half-inch are suture placed. The drapes removed patient left come out of general anesthetic and stretcher to the PACU in stable condition. At the conclusion of the procedure all instrument, sponge, needle counts are correct. Patient tolerated procedure extremely well to  Complications:  None apparent  EBL:  Minimal less than 50 ML's.  Specimen:  Right breast tissue x3.

## 2012-05-04 NOTE — Anesthesia Procedure Notes (Signed)
Procedure Name: LMA Insertion Date/Time: 05/04/2012 7:55 AM Performed by: Antony Contras, Kailie Polus L Pre-anesthesia Checklist: Patient identified, Patient being monitored, Emergency Drugs available, Timeout performed and Suction available Patient Re-evaluated:Patient Re-evaluated prior to inductionOxygen Delivery Method: Circle system utilized Preoxygenation: Pre-oxygenation with 100% oxygen Intubation Type: IV induction Ventilation: Mask ventilation without difficulty LMA: LMA inserted LMA Size: 4.0 Number of attempts: 1 Placement Confirmation: positive ETCO2 and breath sounds checked- equal and bilateral Tube secured with: Tape Dental Injury: Teeth and Oropharynx as per pre-operative assessment

## 2012-05-04 NOTE — H&P (Signed)
NTS SOAP Note  Vital Signs:  Vitals as of: [id number]: Systolic Q000111Q: Diastolic 99991111: Heart Rate 80: Temp 97.78F: Height 62ft 2in: Weight 112Lbs 0 Ounces: Pain Level 5: BMI 20  BMI : 20.48 kg/m2  Subjective: This 62 Years 2 Months old Female presents for of right breast swelling. There's been some increase noted swelling within the right breast. Right breast is slightly larger than the left. She does have a scar in the lateral aspect along the axillary tail. Patient is unaware of previous biopsies or surgeries. Patient does receive hemodialysis and has AV fistula noted on the right upper extremity. She denies any redness of the breast. No significant pain. No nipple discharge or changes. No skin changes other than previously mentioned. She denies any fevers or chills. No significant weight changes.  Review of Symptoms:  Constitutional:unremarkable   Head:unremarkable    Eyes:unremarkable   Nose/Mouth/Throat:unremarkable Cardiovascular:  unremarkable   Respiratory:unremarkable   Gastrointestinal:  unremarkable   Genitourinary:unremarkable     Musculoskeletal:unremarkable   as per history of present illness   as per history of present illness Hematolgic/Lymphatic:unremarkable     Allergic/Immunologic:unremarkable     Past Medical History:    Reviewed   Past Medical History  Surgical History: AV fistula (bilateral upper ext) Medical Problems: Anemia, ESRD, HTN, HIV, Inflammatory breast disease Psychiatric History: anxiety, depression Allergies: PCN Medications: EMLA, Epivir, Epogen, Fentanyl, Fosrenol, Gabapentin, Isentress, Isopto tears, Levaquin, Lidoderm, Nephrovite, oxycodone, protonix, retrovir, sildenafil, xanax, zofran   Social History:Reviewed  Social History  Preferred Language: English ([place]) Race:  Black or African American Ethnicity: Not Hispanic / Latino Age: 62 Years 1 Months Marital Status:  S Alcohol:  no Recreational drug(s): no   Smoking Status: Unknown if ever smoked Functional Status reviewed on mm/dd/yyyy ------------------------------------------------ Bathing: Normal Cooking: Normal Dressing: Normal Driving: Normal Eating: Normal Managing Meds: Normal Oral Care: Normal Shopping: Normal Toileting: Normal Transferring: Normal Walking: Normal Cognitive Status reviewed on mm/dd/yyyy ------------------------------------------------ Attention: Normal Decision Making: Normal Language: Normal Memory: Normal Motor: Normal Perception: Normal Problem Solving: Normal Visual and Spatial: Normal   Family History:  Reviewed   Family History  Is there a family history [id number]    Objective Information: General:  Well appearing, well nourished in no distress. Skin:     no rash or prominent lesions Head:Atraumatic; no masses; no abnormalities Eyes:  conjunctiva clear, EOM intact, PERRL Mouth:  Mucous membranes moist, no mucosal lesions. Neck:  Supple without lymphadenopathy.  Heart:  RRR, no murmur Lungs:    CTA bilaterally, no wheezes, rhonchi, rales.  Breathing unlabored. Breasts:    No lumps or nipple discharge.  some noted swelling within the right breast.  No erythema. No palpable mass. Abdomen:Soft, NT/ND, no HSM, no masses.   mammogram and ultrasound of right breast: demonstrates some edema within the soft tissue. No discrete masses noted. Assessment:    Plan: right breast swelling. At this time I had a long discussion with patient regarding the findings. My overall suspicion of a underlying breast cancer is relatively low given the appearance however I have discussed with the patient options for proceeding with biopsy.due to the extensive nature of this I do feel multiple biopsies were the most prudent. In order to facilitate this I did recommend proceeding in the operating room under MAC sedation.risks benefits  alternatives were discussed. Patient's questions concerns were addressed. Patient will be scheduled at her convenience.  Patient Education:Alternative treatments to surgery were discussed with patient (and family).  Risks and benefits  of procedure were fully explained to the patient (and family) who gave informed consent. Patient/family questions were addressed.  Follow-up:PRN

## 2012-05-04 NOTE — Preoperative (Signed)
Beta Blockers   Reason not to administer Beta Blockers:Not Applicable 

## 2012-05-04 NOTE — Interval H&P Note (Signed)
History and Physical Interval Note:  05/04/2012 7:40 AM  [name]  has presented today for surgery, with the diagnosis of right breast mass  The various methods of treatment have been discussed with the patient and family. After consideration of risks, benefits and other options for treatment, the patient has consented to  Procedure(s): EXCISION OF BREAST BIOPSY (Right) as a surgical intervention .  The patient's history has been reviewed, patient examined, no change in status, stable for surgery.  I have reviewed the patient's chart and labs.  Questions were answered to the patient's satisfaction.     ZIEGLER,BRENT C

## 2012-05-04 NOTE — Transfer of Care (Signed)
Immediate Anesthesia Transfer of Care Note  Patient: [name]  Procedure(s) Performed: Procedure(s) with comments: EXCISION OF BREAST BIOPSY (Right) - Right Excisional Breast Biopsy  Patient Location: PACU  Anesthesia Type:General  Level of Consciousness: awake, alert , oriented and patient cooperative  Airway & Oxygen Therapy: Patient Spontanous Breathing and Patient connected to face mask oxygen  Post-op Assessment: Report given to PACU RN and Post -op Vital signs reviewed and stable  Post vital signs: Reviewed and stable  Complications: No apparent anesthesia complications

## 2012-05-07 DIAGNOSIS — N2581 Secondary hyperparathyroidism of renal origin: Secondary | ICD-10-CM | POA: Diagnosis not present

## 2012-05-07 DIAGNOSIS — N186 End stage renal disease: Secondary | ICD-10-CM | POA: Diagnosis not present

## 2012-05-07 DIAGNOSIS — D472 Monoclonal gammopathy: Secondary | ICD-10-CM | POA: Diagnosis not present

## 2012-05-07 DIAGNOSIS — N039 Chronic nephritic syndrome with unspecified morphologic changes: Secondary | ICD-10-CM | POA: Diagnosis not present

## 2012-05-09 ENCOUNTER — Encounter (HOSPITAL_COMMUNITY): Payer: Self-pay | Admitting: General Surgery

## 2012-05-10 DIAGNOSIS — N186 End stage renal disease: Secondary | ICD-10-CM | POA: Diagnosis not present

## 2012-05-10 DIAGNOSIS — D509 Iron deficiency anemia, unspecified: Secondary | ICD-10-CM | POA: Diagnosis not present

## 2012-05-10 DIAGNOSIS — N2581 Secondary hyperparathyroidism of renal origin: Secondary | ICD-10-CM | POA: Diagnosis not present

## 2012-05-10 DIAGNOSIS — N039 Chronic nephritic syndrome with unspecified morphologic changes: Secondary | ICD-10-CM | POA: Diagnosis not present

## 2012-05-11 DIAGNOSIS — D631 Anemia in chronic kidney disease: Secondary | ICD-10-CM | POA: Diagnosis not present

## 2012-05-12 DIAGNOSIS — G894 Chronic pain syndrome: Secondary | ICD-10-CM | POA: Diagnosis not present

## 2012-05-12 DIAGNOSIS — D472 Monoclonal gammopathy: Secondary | ICD-10-CM | POA: Diagnosis not present

## 2012-05-12 DIAGNOSIS — N2581 Secondary hyperparathyroidism of renal origin: Secondary | ICD-10-CM | POA: Diagnosis not present

## 2012-05-12 DIAGNOSIS — D509 Iron deficiency anemia, unspecified: Secondary | ICD-10-CM | POA: Diagnosis not present

## 2012-05-12 DIAGNOSIS — N039 Chronic nephritic syndrome with unspecified morphologic changes: Secondary | ICD-10-CM | POA: Diagnosis not present

## 2012-05-12 DIAGNOSIS — N186 End stage renal disease: Secondary | ICD-10-CM | POA: Diagnosis not present

## 2012-05-19 DIAGNOSIS — N2581 Secondary hyperparathyroidism of renal origin: Secondary | ICD-10-CM | POA: Diagnosis not present

## 2012-05-19 DIAGNOSIS — D631 Anemia in chronic kidney disease: Secondary | ICD-10-CM | POA: Diagnosis not present

## 2012-05-19 DIAGNOSIS — N186 End stage renal disease: Secondary | ICD-10-CM | POA: Diagnosis not present

## 2012-05-19 DIAGNOSIS — D472 Monoclonal gammopathy: Secondary | ICD-10-CM | POA: Diagnosis not present

## 2012-05-19 DIAGNOSIS — D509 Iron deficiency anemia, unspecified: Secondary | ICD-10-CM | POA: Diagnosis not present

## 2012-05-21 DIAGNOSIS — N039 Chronic nephritic syndrome with unspecified morphologic changes: Secondary | ICD-10-CM | POA: Diagnosis not present

## 2012-05-21 DIAGNOSIS — D631 Anemia in chronic kidney disease: Secondary | ICD-10-CM | POA: Diagnosis not present

## 2012-05-21 DIAGNOSIS — N2581 Secondary hyperparathyroidism of renal origin: Secondary | ICD-10-CM | POA: Diagnosis not present

## 2012-05-21 DIAGNOSIS — E878 Other disorders of electrolyte and fluid balance, not elsewhere classified: Secondary | ICD-10-CM | POA: Diagnosis not present

## 2012-05-21 DIAGNOSIS — D472 Monoclonal gammopathy: Secondary | ICD-10-CM | POA: Diagnosis not present

## 2012-05-21 DIAGNOSIS — D509 Iron deficiency anemia, unspecified: Secondary | ICD-10-CM | POA: Diagnosis not present

## 2012-05-21 DIAGNOSIS — N186 End stage renal disease: Secondary | ICD-10-CM | POA: Diagnosis not present

## 2012-05-23 DIAGNOSIS — I871 Compression of vein: Secondary | ICD-10-CM | POA: Diagnosis not present

## 2012-05-23 DIAGNOSIS — N186 End stage renal disease: Secondary | ICD-10-CM | POA: Diagnosis not present

## 2012-05-23 DIAGNOSIS — T82898A Other specified complication of vascular prosthetic devices, implants and grafts, initial encounter: Secondary | ICD-10-CM | POA: Diagnosis not present

## 2012-05-24 DIAGNOSIS — D509 Iron deficiency anemia, unspecified: Secondary | ICD-10-CM | POA: Diagnosis not present

## 2012-05-24 DIAGNOSIS — N186 End stage renal disease: Secondary | ICD-10-CM | POA: Diagnosis not present

## 2012-05-24 DIAGNOSIS — N2581 Secondary hyperparathyroidism of renal origin: Secondary | ICD-10-CM | POA: Diagnosis not present

## 2012-05-24 DIAGNOSIS — D472 Monoclonal gammopathy: Secondary | ICD-10-CM | POA: Diagnosis not present

## 2012-05-24 DIAGNOSIS — N039 Chronic nephritic syndrome with unspecified morphologic changes: Secondary | ICD-10-CM | POA: Diagnosis not present

## 2012-05-26 DIAGNOSIS — N186 End stage renal disease: Secondary | ICD-10-CM | POA: Diagnosis not present

## 2012-05-26 DIAGNOSIS — D509 Iron deficiency anemia, unspecified: Secondary | ICD-10-CM | POA: Diagnosis not present

## 2012-05-26 DIAGNOSIS — D472 Monoclonal gammopathy: Secondary | ICD-10-CM | POA: Diagnosis not present

## 2012-05-26 DIAGNOSIS — N039 Chronic nephritic syndrome with unspecified morphologic changes: Secondary | ICD-10-CM | POA: Diagnosis not present

## 2012-05-26 DIAGNOSIS — N2581 Secondary hyperparathyroidism of renal origin: Secondary | ICD-10-CM | POA: Diagnosis not present

## 2012-05-28 DIAGNOSIS — N2581 Secondary hyperparathyroidism of renal origin: Secondary | ICD-10-CM | POA: Diagnosis not present

## 2012-05-28 DIAGNOSIS — N039 Chronic nephritic syndrome with unspecified morphologic changes: Secondary | ICD-10-CM | POA: Diagnosis not present

## 2012-05-28 DIAGNOSIS — D472 Monoclonal gammopathy: Secondary | ICD-10-CM | POA: Diagnosis not present

## 2012-05-28 DIAGNOSIS — N186 End stage renal disease: Secondary | ICD-10-CM | POA: Diagnosis not present

## 2012-05-28 DIAGNOSIS — D509 Iron deficiency anemia, unspecified: Secondary | ICD-10-CM | POA: Diagnosis not present

## 2012-05-30 DIAGNOSIS — N186 End stage renal disease: Secondary | ICD-10-CM | POA: Diagnosis not present

## 2012-05-31 DIAGNOSIS — D509 Iron deficiency anemia, unspecified: Secondary | ICD-10-CM | POA: Diagnosis not present

## 2012-05-31 DIAGNOSIS — D472 Monoclonal gammopathy: Secondary | ICD-10-CM | POA: Diagnosis not present

## 2012-05-31 DIAGNOSIS — D631 Anemia in chronic kidney disease: Secondary | ICD-10-CM | POA: Diagnosis not present

## 2012-05-31 DIAGNOSIS — N2581 Secondary hyperparathyroidism of renal origin: Secondary | ICD-10-CM | POA: Diagnosis not present

## 2012-05-31 DIAGNOSIS — N186 End stage renal disease: Secondary | ICD-10-CM | POA: Diagnosis not present

## 2012-05-31 DIAGNOSIS — R110 Nausea: Secondary | ICD-10-CM | POA: Diagnosis not present

## 2012-06-02 DIAGNOSIS — D509 Iron deficiency anemia, unspecified: Secondary | ICD-10-CM | POA: Diagnosis not present

## 2012-06-02 DIAGNOSIS — N2581 Secondary hyperparathyroidism of renal origin: Secondary | ICD-10-CM | POA: Diagnosis not present

## 2012-06-02 DIAGNOSIS — D472 Monoclonal gammopathy: Secondary | ICD-10-CM | POA: Diagnosis not present

## 2012-06-02 DIAGNOSIS — N039 Chronic nephritic syndrome with unspecified morphologic changes: Secondary | ICD-10-CM | POA: Diagnosis not present

## 2012-06-02 DIAGNOSIS — N186 End stage renal disease: Secondary | ICD-10-CM | POA: Diagnosis not present

## 2012-06-02 DIAGNOSIS — R110 Nausea: Secondary | ICD-10-CM | POA: Diagnosis not present

## 2012-06-04 DIAGNOSIS — R110 Nausea: Secondary | ICD-10-CM | POA: Diagnosis not present

## 2012-06-04 DIAGNOSIS — N186 End stage renal disease: Secondary | ICD-10-CM | POA: Diagnosis not present

## 2012-06-04 DIAGNOSIS — D509 Iron deficiency anemia, unspecified: Secondary | ICD-10-CM | POA: Diagnosis not present

## 2012-06-04 DIAGNOSIS — N2581 Secondary hyperparathyroidism of renal origin: Secondary | ICD-10-CM | POA: Diagnosis not present

## 2012-06-04 DIAGNOSIS — D472 Monoclonal gammopathy: Secondary | ICD-10-CM | POA: Diagnosis not present

## 2012-06-04 DIAGNOSIS — N039 Chronic nephritic syndrome with unspecified morphologic changes: Secondary | ICD-10-CM | POA: Diagnosis not present

## 2012-06-07 DIAGNOSIS — N2581 Secondary hyperparathyroidism of renal origin: Secondary | ICD-10-CM | POA: Diagnosis not present

## 2012-06-07 DIAGNOSIS — N186 End stage renal disease: Secondary | ICD-10-CM | POA: Diagnosis not present

## 2012-06-07 DIAGNOSIS — R110 Nausea: Secondary | ICD-10-CM | POA: Diagnosis not present

## 2012-06-07 DIAGNOSIS — N039 Chronic nephritic syndrome with unspecified morphologic changes: Secondary | ICD-10-CM | POA: Diagnosis not present

## 2012-06-07 DIAGNOSIS — D509 Iron deficiency anemia, unspecified: Secondary | ICD-10-CM | POA: Diagnosis not present

## 2012-06-07 DIAGNOSIS — D472 Monoclonal gammopathy: Secondary | ICD-10-CM | POA: Diagnosis not present

## 2012-06-09 DIAGNOSIS — G894 Chronic pain syndrome: Secondary | ICD-10-CM | POA: Diagnosis not present

## 2012-06-11 DIAGNOSIS — D472 Monoclonal gammopathy: Secondary | ICD-10-CM | POA: Diagnosis not present

## 2012-06-11 DIAGNOSIS — N186 End stage renal disease: Secondary | ICD-10-CM | POA: Diagnosis not present

## 2012-06-11 DIAGNOSIS — N2581 Secondary hyperparathyroidism of renal origin: Secondary | ICD-10-CM | POA: Diagnosis not present

## 2012-06-11 DIAGNOSIS — D631 Anemia in chronic kidney disease: Secondary | ICD-10-CM | POA: Diagnosis not present

## 2012-06-11 DIAGNOSIS — R110 Nausea: Secondary | ICD-10-CM | POA: Diagnosis not present

## 2012-06-11 DIAGNOSIS — N039 Chronic nephritic syndrome with unspecified morphologic changes: Secondary | ICD-10-CM | POA: Diagnosis not present

## 2012-06-11 DIAGNOSIS — D509 Iron deficiency anemia, unspecified: Secondary | ICD-10-CM | POA: Diagnosis not present

## 2012-06-14 DIAGNOSIS — N186 End stage renal disease: Secondary | ICD-10-CM | POA: Diagnosis not present

## 2012-06-14 DIAGNOSIS — N039 Chronic nephritic syndrome with unspecified morphologic changes: Secondary | ICD-10-CM | POA: Diagnosis not present

## 2012-06-14 DIAGNOSIS — N2581 Secondary hyperparathyroidism of renal origin: Secondary | ICD-10-CM | POA: Diagnosis not present

## 2012-06-14 DIAGNOSIS — D509 Iron deficiency anemia, unspecified: Secondary | ICD-10-CM | POA: Diagnosis not present

## 2012-06-14 DIAGNOSIS — D472 Monoclonal gammopathy: Secondary | ICD-10-CM | POA: Diagnosis not present

## 2012-06-14 DIAGNOSIS — R110 Nausea: Secondary | ICD-10-CM | POA: Diagnosis not present

## 2012-06-14 DIAGNOSIS — D631 Anemia in chronic kidney disease: Secondary | ICD-10-CM | POA: Diagnosis not present

## 2012-06-16 DIAGNOSIS — N039 Chronic nephritic syndrome with unspecified morphologic changes: Secondary | ICD-10-CM | POA: Diagnosis not present

## 2012-06-16 DIAGNOSIS — N186 End stage renal disease: Secondary | ICD-10-CM | POA: Diagnosis not present

## 2012-06-16 DIAGNOSIS — D472 Monoclonal gammopathy: Secondary | ICD-10-CM | POA: Diagnosis not present

## 2012-06-16 DIAGNOSIS — D509 Iron deficiency anemia, unspecified: Secondary | ICD-10-CM | POA: Diagnosis not present

## 2012-06-16 DIAGNOSIS — N2581 Secondary hyperparathyroidism of renal origin: Secondary | ICD-10-CM | POA: Diagnosis not present

## 2012-06-16 DIAGNOSIS — R110 Nausea: Secondary | ICD-10-CM | POA: Diagnosis not present

## 2012-06-18 DIAGNOSIS — N2581 Secondary hyperparathyroidism of renal origin: Secondary | ICD-10-CM | POA: Diagnosis not present

## 2012-06-18 DIAGNOSIS — R110 Nausea: Secondary | ICD-10-CM | POA: Diagnosis not present

## 2012-06-18 DIAGNOSIS — D472 Monoclonal gammopathy: Secondary | ICD-10-CM | POA: Diagnosis not present

## 2012-06-18 DIAGNOSIS — D631 Anemia in chronic kidney disease: Secondary | ICD-10-CM | POA: Diagnosis not present

## 2012-06-18 DIAGNOSIS — N186 End stage renal disease: Secondary | ICD-10-CM | POA: Diagnosis not present

## 2012-06-18 DIAGNOSIS — D509 Iron deficiency anemia, unspecified: Secondary | ICD-10-CM | POA: Diagnosis not present

## 2012-06-21 DIAGNOSIS — N186 End stage renal disease: Secondary | ICD-10-CM | POA: Diagnosis not present

## 2012-06-21 DIAGNOSIS — D509 Iron deficiency anemia, unspecified: Secondary | ICD-10-CM | POA: Diagnosis not present

## 2012-06-21 DIAGNOSIS — N039 Chronic nephritic syndrome with unspecified morphologic changes: Secondary | ICD-10-CM | POA: Diagnosis not present

## 2012-06-21 DIAGNOSIS — N2581 Secondary hyperparathyroidism of renal origin: Secondary | ICD-10-CM | POA: Diagnosis not present

## 2012-06-21 DIAGNOSIS — R110 Nausea: Secondary | ICD-10-CM | POA: Diagnosis not present

## 2012-06-21 DIAGNOSIS — D472 Monoclonal gammopathy: Secondary | ICD-10-CM | POA: Diagnosis not present

## 2012-06-23 DIAGNOSIS — D509 Iron deficiency anemia, unspecified: Secondary | ICD-10-CM | POA: Diagnosis not present

## 2012-06-23 DIAGNOSIS — N2581 Secondary hyperparathyroidism of renal origin: Secondary | ICD-10-CM | POA: Diagnosis not present

## 2012-06-23 DIAGNOSIS — D472 Monoclonal gammopathy: Secondary | ICD-10-CM | POA: Diagnosis not present

## 2012-06-23 DIAGNOSIS — R110 Nausea: Secondary | ICD-10-CM | POA: Diagnosis not present

## 2012-06-23 DIAGNOSIS — D631 Anemia in chronic kidney disease: Secondary | ICD-10-CM | POA: Diagnosis not present

## 2012-06-23 DIAGNOSIS — N186 End stage renal disease: Secondary | ICD-10-CM | POA: Diagnosis not present

## 2012-06-25 DIAGNOSIS — N186 End stage renal disease: Secondary | ICD-10-CM | POA: Diagnosis not present

## 2012-06-25 DIAGNOSIS — R110 Nausea: Secondary | ICD-10-CM | POA: Diagnosis not present

## 2012-06-25 DIAGNOSIS — D509 Iron deficiency anemia, unspecified: Secondary | ICD-10-CM | POA: Diagnosis not present

## 2012-06-25 DIAGNOSIS — D472 Monoclonal gammopathy: Secondary | ICD-10-CM | POA: Diagnosis not present

## 2012-06-25 DIAGNOSIS — D631 Anemia in chronic kidney disease: Secondary | ICD-10-CM | POA: Diagnosis not present

## 2012-06-25 DIAGNOSIS — N2581 Secondary hyperparathyroidism of renal origin: Secondary | ICD-10-CM | POA: Diagnosis not present

## 2012-06-27 DIAGNOSIS — D631 Anemia in chronic kidney disease: Secondary | ICD-10-CM | POA: Diagnosis not present

## 2012-06-27 DIAGNOSIS — D472 Monoclonal gammopathy: Secondary | ICD-10-CM | POA: Diagnosis not present

## 2012-06-27 DIAGNOSIS — D509 Iron deficiency anemia, unspecified: Secondary | ICD-10-CM | POA: Diagnosis not present

## 2012-06-27 DIAGNOSIS — R110 Nausea: Secondary | ICD-10-CM | POA: Diagnosis not present

## 2012-06-27 DIAGNOSIS — N186 End stage renal disease: Secondary | ICD-10-CM | POA: Diagnosis not present

## 2012-06-27 DIAGNOSIS — N2581 Secondary hyperparathyroidism of renal origin: Secondary | ICD-10-CM | POA: Diagnosis not present

## 2012-06-29 DIAGNOSIS — D509 Iron deficiency anemia, unspecified: Secondary | ICD-10-CM | POA: Diagnosis not present

## 2012-06-29 DIAGNOSIS — D631 Anemia in chronic kidney disease: Secondary | ICD-10-CM | POA: Diagnosis not present

## 2012-06-29 DIAGNOSIS — N2581 Secondary hyperparathyroidism of renal origin: Secondary | ICD-10-CM | POA: Diagnosis not present

## 2012-06-29 DIAGNOSIS — N186 End stage renal disease: Secondary | ICD-10-CM | POA: Diagnosis not present

## 2012-06-29 DIAGNOSIS — R110 Nausea: Secondary | ICD-10-CM | POA: Diagnosis not present

## 2012-06-29 DIAGNOSIS — D472 Monoclonal gammopathy: Secondary | ICD-10-CM | POA: Diagnosis not present

## 2012-07-01 DIAGNOSIS — N186 End stage renal disease: Secondary | ICD-10-CM | POA: Diagnosis not present

## 2012-07-01 DIAGNOSIS — D631 Anemia in chronic kidney disease: Secondary | ICD-10-CM | POA: Diagnosis not present

## 2012-07-01 DIAGNOSIS — D472 Monoclonal gammopathy: Secondary | ICD-10-CM | POA: Diagnosis not present

## 2012-07-01 DIAGNOSIS — N2581 Secondary hyperparathyroidism of renal origin: Secondary | ICD-10-CM | POA: Diagnosis not present

## 2012-07-01 DIAGNOSIS — D509 Iron deficiency anemia, unspecified: Secondary | ICD-10-CM | POA: Diagnosis not present

## 2012-07-01 DIAGNOSIS — N039 Chronic nephritic syndrome with unspecified morphologic changes: Secondary | ICD-10-CM | POA: Diagnosis not present

## 2012-07-01 DIAGNOSIS — R112 Nausea with vomiting, unspecified: Secondary | ICD-10-CM | POA: Diagnosis not present

## 2012-07-04 DIAGNOSIS — D472 Monoclonal gammopathy: Secondary | ICD-10-CM | POA: Diagnosis not present

## 2012-07-04 DIAGNOSIS — R112 Nausea with vomiting, unspecified: Secondary | ICD-10-CM | POA: Diagnosis not present

## 2012-07-04 DIAGNOSIS — N2581 Secondary hyperparathyroidism of renal origin: Secondary | ICD-10-CM | POA: Diagnosis not present

## 2012-07-04 DIAGNOSIS — N039 Chronic nephritic syndrome with unspecified morphologic changes: Secondary | ICD-10-CM | POA: Diagnosis not present

## 2012-07-04 DIAGNOSIS — D509 Iron deficiency anemia, unspecified: Secondary | ICD-10-CM | POA: Diagnosis not present

## 2012-07-04 DIAGNOSIS — N186 End stage renal disease: Secondary | ICD-10-CM | POA: Diagnosis not present

## 2012-07-06 DIAGNOSIS — N186 End stage renal disease: Secondary | ICD-10-CM | POA: Diagnosis not present

## 2012-07-06 DIAGNOSIS — R112 Nausea with vomiting, unspecified: Secondary | ICD-10-CM | POA: Diagnosis not present

## 2012-07-06 DIAGNOSIS — D509 Iron deficiency anemia, unspecified: Secondary | ICD-10-CM | POA: Diagnosis not present

## 2012-07-06 DIAGNOSIS — D472 Monoclonal gammopathy: Secondary | ICD-10-CM | POA: Diagnosis not present

## 2012-07-06 DIAGNOSIS — D631 Anemia in chronic kidney disease: Secondary | ICD-10-CM | POA: Diagnosis not present

## 2012-07-06 DIAGNOSIS — N2581 Secondary hyperparathyroidism of renal origin: Secondary | ICD-10-CM | POA: Diagnosis not present

## 2012-07-07 DIAGNOSIS — E042 Nontoxic multinodular goiter: Secondary | ICD-10-CM | POA: Diagnosis not present

## 2012-07-07 DIAGNOSIS — G894 Chronic pain syndrome: Secondary | ICD-10-CM | POA: Diagnosis not present

## 2012-07-08 DIAGNOSIS — N2581 Secondary hyperparathyroidism of renal origin: Secondary | ICD-10-CM | POA: Diagnosis not present

## 2012-07-08 DIAGNOSIS — D472 Monoclonal gammopathy: Secondary | ICD-10-CM | POA: Diagnosis not present

## 2012-07-08 DIAGNOSIS — R112 Nausea with vomiting, unspecified: Secondary | ICD-10-CM | POA: Diagnosis not present

## 2012-07-08 DIAGNOSIS — D509 Iron deficiency anemia, unspecified: Secondary | ICD-10-CM | POA: Diagnosis not present

## 2012-07-08 DIAGNOSIS — N186 End stage renal disease: Secondary | ICD-10-CM | POA: Diagnosis not present

## 2012-07-08 DIAGNOSIS — D631 Anemia in chronic kidney disease: Secondary | ICD-10-CM | POA: Diagnosis not present

## 2012-07-11 DIAGNOSIS — N039 Chronic nephritic syndrome with unspecified morphologic changes: Secondary | ICD-10-CM | POA: Diagnosis not present

## 2012-07-11 DIAGNOSIS — D472 Monoclonal gammopathy: Secondary | ICD-10-CM | POA: Diagnosis not present

## 2012-07-11 DIAGNOSIS — R112 Nausea with vomiting, unspecified: Secondary | ICD-10-CM | POA: Diagnosis not present

## 2012-07-11 DIAGNOSIS — D631 Anemia in chronic kidney disease: Secondary | ICD-10-CM | POA: Diagnosis not present

## 2012-07-11 DIAGNOSIS — D509 Iron deficiency anemia, unspecified: Secondary | ICD-10-CM | POA: Diagnosis not present

## 2012-07-11 DIAGNOSIS — N186 End stage renal disease: Secondary | ICD-10-CM | POA: Diagnosis not present

## 2012-07-11 DIAGNOSIS — N2581 Secondary hyperparathyroidism of renal origin: Secondary | ICD-10-CM | POA: Diagnosis not present

## 2012-07-13 DIAGNOSIS — N039 Chronic nephritic syndrome with unspecified morphologic changes: Secondary | ICD-10-CM | POA: Diagnosis not present

## 2012-07-13 DIAGNOSIS — R112 Nausea with vomiting, unspecified: Secondary | ICD-10-CM | POA: Diagnosis not present

## 2012-07-13 DIAGNOSIS — D472 Monoclonal gammopathy: Secondary | ICD-10-CM | POA: Diagnosis not present

## 2012-07-13 DIAGNOSIS — D509 Iron deficiency anemia, unspecified: Secondary | ICD-10-CM | POA: Diagnosis not present

## 2012-07-13 DIAGNOSIS — N2581 Secondary hyperparathyroidism of renal origin: Secondary | ICD-10-CM | POA: Diagnosis not present

## 2012-07-13 DIAGNOSIS — N186 End stage renal disease: Secondary | ICD-10-CM | POA: Diagnosis not present

## 2012-07-15 DIAGNOSIS — N2581 Secondary hyperparathyroidism of renal origin: Secondary | ICD-10-CM | POA: Diagnosis not present

## 2012-07-15 DIAGNOSIS — D509 Iron deficiency anemia, unspecified: Secondary | ICD-10-CM | POA: Diagnosis not present

## 2012-07-15 DIAGNOSIS — D631 Anemia in chronic kidney disease: Secondary | ICD-10-CM | POA: Diagnosis not present

## 2012-07-15 DIAGNOSIS — N186 End stage renal disease: Secondary | ICD-10-CM | POA: Diagnosis not present

## 2012-07-15 DIAGNOSIS — R112 Nausea with vomiting, unspecified: Secondary | ICD-10-CM | POA: Diagnosis not present

## 2012-07-15 DIAGNOSIS — D472 Monoclonal gammopathy: Secondary | ICD-10-CM | POA: Diagnosis not present

## 2012-07-18 DIAGNOSIS — D472 Monoclonal gammopathy: Secondary | ICD-10-CM | POA: Diagnosis not present

## 2012-07-18 DIAGNOSIS — D631 Anemia in chronic kidney disease: Secondary | ICD-10-CM | POA: Diagnosis not present

## 2012-07-18 DIAGNOSIS — N2581 Secondary hyperparathyroidism of renal origin: Secondary | ICD-10-CM | POA: Diagnosis not present

## 2012-07-18 DIAGNOSIS — R112 Nausea with vomiting, unspecified: Secondary | ICD-10-CM | POA: Diagnosis not present

## 2012-07-18 DIAGNOSIS — N186 End stage renal disease: Secondary | ICD-10-CM | POA: Diagnosis not present

## 2012-07-18 DIAGNOSIS — D509 Iron deficiency anemia, unspecified: Secondary | ICD-10-CM | POA: Diagnosis not present

## 2012-07-20 DIAGNOSIS — D472 Monoclonal gammopathy: Secondary | ICD-10-CM | POA: Diagnosis not present

## 2012-07-20 DIAGNOSIS — R112 Nausea with vomiting, unspecified: Secondary | ICD-10-CM | POA: Diagnosis not present

## 2012-07-20 DIAGNOSIS — N2581 Secondary hyperparathyroidism of renal origin: Secondary | ICD-10-CM | POA: Diagnosis not present

## 2012-07-20 DIAGNOSIS — D509 Iron deficiency anemia, unspecified: Secondary | ICD-10-CM | POA: Diagnosis not present

## 2012-07-20 DIAGNOSIS — D631 Anemia in chronic kidney disease: Secondary | ICD-10-CM | POA: Diagnosis not present

## 2012-07-20 DIAGNOSIS — N186 End stage renal disease: Secondary | ICD-10-CM | POA: Diagnosis not present

## 2012-07-22 DIAGNOSIS — D631 Anemia in chronic kidney disease: Secondary | ICD-10-CM | POA: Diagnosis not present

## 2012-07-22 DIAGNOSIS — D509 Iron deficiency anemia, unspecified: Secondary | ICD-10-CM | POA: Diagnosis not present

## 2012-07-22 DIAGNOSIS — N2581 Secondary hyperparathyroidism of renal origin: Secondary | ICD-10-CM | POA: Diagnosis not present

## 2012-07-22 DIAGNOSIS — R112 Nausea with vomiting, unspecified: Secondary | ICD-10-CM | POA: Diagnosis not present

## 2012-07-22 DIAGNOSIS — N039 Chronic nephritic syndrome with unspecified morphologic changes: Secondary | ICD-10-CM | POA: Diagnosis not present

## 2012-07-22 DIAGNOSIS — N186 End stage renal disease: Secondary | ICD-10-CM | POA: Diagnosis not present

## 2012-07-22 DIAGNOSIS — D472 Monoclonal gammopathy: Secondary | ICD-10-CM | POA: Diagnosis not present

## 2012-07-25 DIAGNOSIS — R112 Nausea with vomiting, unspecified: Secondary | ICD-10-CM | POA: Diagnosis not present

## 2012-07-25 DIAGNOSIS — D509 Iron deficiency anemia, unspecified: Secondary | ICD-10-CM | POA: Diagnosis not present

## 2012-07-25 DIAGNOSIS — N039 Chronic nephritic syndrome with unspecified morphologic changes: Secondary | ICD-10-CM | POA: Diagnosis not present

## 2012-07-25 DIAGNOSIS — N2581 Secondary hyperparathyroidism of renal origin: Secondary | ICD-10-CM | POA: Diagnosis not present

## 2012-07-25 DIAGNOSIS — N186 End stage renal disease: Secondary | ICD-10-CM | POA: Diagnosis not present

## 2012-07-25 DIAGNOSIS — D472 Monoclonal gammopathy: Secondary | ICD-10-CM | POA: Diagnosis not present

## 2012-07-27 DIAGNOSIS — D509 Iron deficiency anemia, unspecified: Secondary | ICD-10-CM | POA: Diagnosis not present

## 2012-07-27 DIAGNOSIS — N2581 Secondary hyperparathyroidism of renal origin: Secondary | ICD-10-CM | POA: Diagnosis not present

## 2012-07-27 DIAGNOSIS — R112 Nausea with vomiting, unspecified: Secondary | ICD-10-CM | POA: Diagnosis not present

## 2012-07-27 DIAGNOSIS — D472 Monoclonal gammopathy: Secondary | ICD-10-CM | POA: Diagnosis not present

## 2012-07-27 DIAGNOSIS — N186 End stage renal disease: Secondary | ICD-10-CM | POA: Diagnosis not present

## 2012-07-27 DIAGNOSIS — N039 Chronic nephritic syndrome with unspecified morphologic changes: Secondary | ICD-10-CM | POA: Diagnosis not present

## 2012-07-29 DIAGNOSIS — R112 Nausea with vomiting, unspecified: Secondary | ICD-10-CM | POA: Diagnosis not present

## 2012-07-29 DIAGNOSIS — D509 Iron deficiency anemia, unspecified: Secondary | ICD-10-CM | POA: Diagnosis not present

## 2012-07-29 DIAGNOSIS — N186 End stage renal disease: Secondary | ICD-10-CM | POA: Diagnosis not present

## 2012-07-29 DIAGNOSIS — N039 Chronic nephritic syndrome with unspecified morphologic changes: Secondary | ICD-10-CM | POA: Diagnosis not present

## 2012-07-29 DIAGNOSIS — N2581 Secondary hyperparathyroidism of renal origin: Secondary | ICD-10-CM | POA: Diagnosis not present

## 2012-07-29 DIAGNOSIS — D472 Monoclonal gammopathy: Secondary | ICD-10-CM | POA: Diagnosis not present

## 2012-07-30 DIAGNOSIS — N186 End stage renal disease: Secondary | ICD-10-CM | POA: Diagnosis not present

## 2012-08-01 DIAGNOSIS — D631 Anemia in chronic kidney disease: Secondary | ICD-10-CM | POA: Diagnosis not present

## 2012-08-01 DIAGNOSIS — D472 Monoclonal gammopathy: Secondary | ICD-10-CM | POA: Diagnosis not present

## 2012-08-01 DIAGNOSIS — N186 End stage renal disease: Secondary | ICD-10-CM | POA: Diagnosis not present

## 2012-08-01 DIAGNOSIS — D509 Iron deficiency anemia, unspecified: Secondary | ICD-10-CM | POA: Diagnosis not present

## 2012-08-01 DIAGNOSIS — N2581 Secondary hyperparathyroidism of renal origin: Secondary | ICD-10-CM | POA: Diagnosis not present

## 2012-08-03 DIAGNOSIS — N186 End stage renal disease: Secondary | ICD-10-CM | POA: Diagnosis not present

## 2012-08-04 DIAGNOSIS — G894 Chronic pain syndrome: Secondary | ICD-10-CM | POA: Diagnosis not present

## 2012-08-11 ENCOUNTER — Other Ambulatory Visit: Payer: Self-pay | Admitting: *Deleted

## 2012-08-11 MED ORDER — FENTANYL 25 MCG/HR TD PT72: MEDICATED_PATCH | TRANSDERMAL | Status: DC

## 2012-08-29 ENCOUNTER — Other Ambulatory Visit: Payer: Self-pay | Admitting: Geriatric Medicine

## 2012-08-29 DIAGNOSIS — N186 End stage renal disease: Secondary | ICD-10-CM | POA: Diagnosis not present

## 2012-08-29 MED ORDER — ZOLPIDEM TARTRATE 5 MG PO TABS: ORAL_TABLET | ORAL | Status: DC

## 2012-08-31 DIAGNOSIS — D472 Monoclonal gammopathy: Secondary | ICD-10-CM | POA: Diagnosis not present

## 2012-08-31 DIAGNOSIS — N186 End stage renal disease: Secondary | ICD-10-CM | POA: Diagnosis not present

## 2012-08-31 DIAGNOSIS — D631 Anemia in chronic kidney disease: Secondary | ICD-10-CM | POA: Diagnosis not present

## 2012-08-31 DIAGNOSIS — D509 Iron deficiency anemia, unspecified: Secondary | ICD-10-CM | POA: Diagnosis not present

## 2012-08-31 DIAGNOSIS — R110 Nausea: Secondary | ICD-10-CM | POA: Diagnosis not present

## 2012-08-31 DIAGNOSIS — N2581 Secondary hyperparathyroidism of renal origin: Secondary | ICD-10-CM | POA: Diagnosis not present

## 2012-09-05 DIAGNOSIS — D472 Monoclonal gammopathy: Secondary | ICD-10-CM | POA: Diagnosis not present

## 2012-09-05 DIAGNOSIS — R110 Nausea: Secondary | ICD-10-CM | POA: Diagnosis not present

## 2012-09-05 DIAGNOSIS — N186 End stage renal disease: Secondary | ICD-10-CM | POA: Diagnosis not present

## 2012-09-05 DIAGNOSIS — D509 Iron deficiency anemia, unspecified: Secondary | ICD-10-CM | POA: Diagnosis not present

## 2012-09-05 DIAGNOSIS — D631 Anemia in chronic kidney disease: Secondary | ICD-10-CM | POA: Diagnosis not present

## 2012-09-05 DIAGNOSIS — N2581 Secondary hyperparathyroidism of renal origin: Secondary | ICD-10-CM | POA: Diagnosis not present

## 2012-09-09 DIAGNOSIS — R110 Nausea: Secondary | ICD-10-CM | POA: Diagnosis not present

## 2012-09-09 DIAGNOSIS — N186 End stage renal disease: Secondary | ICD-10-CM | POA: Diagnosis not present

## 2012-09-09 DIAGNOSIS — D472 Monoclonal gammopathy: Secondary | ICD-10-CM | POA: Diagnosis not present

## 2012-09-09 DIAGNOSIS — N2581 Secondary hyperparathyroidism of renal origin: Secondary | ICD-10-CM | POA: Diagnosis not present

## 2012-09-09 DIAGNOSIS — N039 Chronic nephritic syndrome with unspecified morphologic changes: Secondary | ICD-10-CM | POA: Diagnosis not present

## 2012-09-09 DIAGNOSIS — D509 Iron deficiency anemia, unspecified: Secondary | ICD-10-CM | POA: Diagnosis not present

## 2012-09-12 DIAGNOSIS — N186 End stage renal disease: Secondary | ICD-10-CM | POA: Diagnosis not present

## 2012-09-12 DIAGNOSIS — D631 Anemia in chronic kidney disease: Secondary | ICD-10-CM | POA: Diagnosis not present

## 2012-09-12 DIAGNOSIS — D509 Iron deficiency anemia, unspecified: Secondary | ICD-10-CM | POA: Diagnosis not present

## 2012-09-12 DIAGNOSIS — R110 Nausea: Secondary | ICD-10-CM | POA: Diagnosis not present

## 2012-09-12 DIAGNOSIS — D472 Monoclonal gammopathy: Secondary | ICD-10-CM | POA: Diagnosis not present

## 2012-09-12 DIAGNOSIS — N2581 Secondary hyperparathyroidism of renal origin: Secondary | ICD-10-CM | POA: Diagnosis not present

## 2012-09-14 DIAGNOSIS — D472 Monoclonal gammopathy: Secondary | ICD-10-CM | POA: Diagnosis not present

## 2012-09-14 DIAGNOSIS — N2581 Secondary hyperparathyroidism of renal origin: Secondary | ICD-10-CM | POA: Diagnosis not present

## 2012-09-14 DIAGNOSIS — N039 Chronic nephritic syndrome with unspecified morphologic changes: Secondary | ICD-10-CM | POA: Diagnosis not present

## 2012-09-14 DIAGNOSIS — N186 End stage renal disease: Secondary | ICD-10-CM | POA: Diagnosis not present

## 2012-09-14 DIAGNOSIS — D509 Iron deficiency anemia, unspecified: Secondary | ICD-10-CM | POA: Diagnosis not present

## 2012-09-14 DIAGNOSIS — R110 Nausea: Secondary | ICD-10-CM | POA: Diagnosis not present

## 2012-09-15 DIAGNOSIS — I2510 Atherosclerotic heart disease of native coronary artery without angina pectoris: Secondary | ICD-10-CM | POA: Insufficient documentation

## 2012-09-15 DIAGNOSIS — N186 End stage renal disease: Secondary | ICD-10-CM | POA: Diagnosis not present

## 2012-09-15 DIAGNOSIS — Z8673 Personal history of transient ischemic attack (TIA), and cerebral infarction without residual deficits: Secondary | ICD-10-CM | POA: Insufficient documentation

## 2012-09-15 DIAGNOSIS — I10 Essential (primary) hypertension: Secondary | ICD-10-CM | POA: Diagnosis not present

## 2012-09-15 DIAGNOSIS — F411 Generalized anxiety disorder: Secondary | ICD-10-CM | POA: Diagnosis present

## 2012-09-15 DIAGNOSIS — T82898A Other specified complication of vascular prosthetic devices, implants and grafts, initial encounter: Secondary | ICD-10-CM | POA: Diagnosis not present

## 2012-09-15 DIAGNOSIS — R5381 Other malaise: Secondary | ICD-10-CM | POA: Diagnosis not present

## 2012-09-15 DIAGNOSIS — I119 Hypertensive heart disease without heart failure: Secondary | ICD-10-CM | POA: Diagnosis not present

## 2012-09-15 DIAGNOSIS — I471 Supraventricular tachycardia, unspecified: Secondary | ICD-10-CM | POA: Diagnosis not present

## 2012-09-15 DIAGNOSIS — F419 Anxiety disorder, unspecified: Secondary | ICD-10-CM | POA: Insufficient documentation

## 2012-09-15 DIAGNOSIS — R51 Headache: Secondary | ICD-10-CM | POA: Diagnosis not present

## 2012-09-15 DIAGNOSIS — I498 Other specified cardiac arrhythmias: Secondary | ICD-10-CM | POA: Diagnosis not present

## 2012-09-15 DIAGNOSIS — D649 Anemia, unspecified: Secondary | ICD-10-CM | POA: Diagnosis not present

## 2012-09-15 DIAGNOSIS — Z21 Asymptomatic human immunodeficiency virus [HIV] infection status: Secondary | ICD-10-CM | POA: Diagnosis not present

## 2012-09-15 DIAGNOSIS — I2789 Other specified pulmonary heart diseases: Secondary | ICD-10-CM | POA: Diagnosis not present

## 2012-09-15 DIAGNOSIS — I120 Hypertensive chronic kidney disease with stage 5 chronic kidney disease or end stage renal disease: Secondary | ICD-10-CM | POA: Diagnosis not present

## 2012-09-15 DIAGNOSIS — Z992 Dependence on renal dialysis: Secondary | ICD-10-CM | POA: Diagnosis not present

## 2012-09-19 DIAGNOSIS — N2581 Secondary hyperparathyroidism of renal origin: Secondary | ICD-10-CM | POA: Diagnosis not present

## 2012-09-19 DIAGNOSIS — D631 Anemia in chronic kidney disease: Secondary | ICD-10-CM | POA: Diagnosis not present

## 2012-09-19 DIAGNOSIS — D472 Monoclonal gammopathy: Secondary | ICD-10-CM | POA: Diagnosis not present

## 2012-09-19 DIAGNOSIS — R110 Nausea: Secondary | ICD-10-CM | POA: Diagnosis not present

## 2012-09-19 DIAGNOSIS — D509 Iron deficiency anemia, unspecified: Secondary | ICD-10-CM | POA: Diagnosis not present

## 2012-09-19 DIAGNOSIS — N186 End stage renal disease: Secondary | ICD-10-CM | POA: Diagnosis not present

## 2012-09-20 DIAGNOSIS — M6281 Muscle weakness (generalized): Secondary | ICD-10-CM | POA: Diagnosis not present

## 2012-09-20 DIAGNOSIS — I471 Supraventricular tachycardia: Secondary | ICD-10-CM | POA: Diagnosis not present

## 2012-09-20 DIAGNOSIS — R279 Unspecified lack of coordination: Secondary | ICD-10-CM | POA: Diagnosis not present

## 2012-09-21 DIAGNOSIS — N2581 Secondary hyperparathyroidism of renal origin: Secondary | ICD-10-CM | POA: Diagnosis not present

## 2012-09-21 DIAGNOSIS — R279 Unspecified lack of coordination: Secondary | ICD-10-CM | POA: Diagnosis not present

## 2012-09-21 DIAGNOSIS — D631 Anemia in chronic kidney disease: Secondary | ICD-10-CM | POA: Diagnosis not present

## 2012-09-21 DIAGNOSIS — D509 Iron deficiency anemia, unspecified: Secondary | ICD-10-CM | POA: Diagnosis not present

## 2012-09-21 DIAGNOSIS — R110 Nausea: Secondary | ICD-10-CM | POA: Diagnosis not present

## 2012-09-21 DIAGNOSIS — D472 Monoclonal gammopathy: Secondary | ICD-10-CM | POA: Diagnosis not present

## 2012-09-21 DIAGNOSIS — N186 End stage renal disease: Secondary | ICD-10-CM | POA: Diagnosis not present

## 2012-09-21 DIAGNOSIS — M6281 Muscle weakness (generalized): Secondary | ICD-10-CM | POA: Diagnosis not present

## 2012-09-21 DIAGNOSIS — I471 Supraventricular tachycardia: Secondary | ICD-10-CM | POA: Diagnosis not present

## 2012-09-22 DIAGNOSIS — M6281 Muscle weakness (generalized): Secondary | ICD-10-CM | POA: Diagnosis not present

## 2012-09-22 DIAGNOSIS — I471 Supraventricular tachycardia: Secondary | ICD-10-CM | POA: Diagnosis not present

## 2012-09-22 DIAGNOSIS — R279 Unspecified lack of coordination: Secondary | ICD-10-CM | POA: Diagnosis not present

## 2012-09-23 DIAGNOSIS — M6281 Muscle weakness (generalized): Secondary | ICD-10-CM | POA: Diagnosis not present

## 2012-09-23 DIAGNOSIS — R279 Unspecified lack of coordination: Secondary | ICD-10-CM | POA: Diagnosis not present

## 2012-09-23 DIAGNOSIS — I471 Supraventricular tachycardia: Secondary | ICD-10-CM | POA: Diagnosis not present

## 2012-09-26 DIAGNOSIS — N186 End stage renal disease: Secondary | ICD-10-CM | POA: Diagnosis not present

## 2012-09-26 DIAGNOSIS — R110 Nausea: Secondary | ICD-10-CM | POA: Diagnosis not present

## 2012-09-26 DIAGNOSIS — N2581 Secondary hyperparathyroidism of renal origin: Secondary | ICD-10-CM | POA: Diagnosis not present

## 2012-09-26 DIAGNOSIS — D631 Anemia in chronic kidney disease: Secondary | ICD-10-CM | POA: Diagnosis not present

## 2012-09-26 DIAGNOSIS — D472 Monoclonal gammopathy: Secondary | ICD-10-CM | POA: Diagnosis not present

## 2012-09-26 DIAGNOSIS — D509 Iron deficiency anemia, unspecified: Secondary | ICD-10-CM | POA: Diagnosis not present

## 2012-09-27 DIAGNOSIS — I471 Supraventricular tachycardia: Secondary | ICD-10-CM | POA: Diagnosis not present

## 2012-09-27 DIAGNOSIS — M6281 Muscle weakness (generalized): Secondary | ICD-10-CM | POA: Diagnosis not present

## 2012-09-27 DIAGNOSIS — R279 Unspecified lack of coordination: Secondary | ICD-10-CM | POA: Diagnosis not present

## 2012-09-28 DIAGNOSIS — D509 Iron deficiency anemia, unspecified: Secondary | ICD-10-CM | POA: Diagnosis not present

## 2012-09-28 DIAGNOSIS — D472 Monoclonal gammopathy: Secondary | ICD-10-CM | POA: Diagnosis not present

## 2012-09-28 DIAGNOSIS — R110 Nausea: Secondary | ICD-10-CM | POA: Diagnosis not present

## 2012-09-28 DIAGNOSIS — D631 Anemia in chronic kidney disease: Secondary | ICD-10-CM | POA: Diagnosis not present

## 2012-09-28 DIAGNOSIS — N2581 Secondary hyperparathyroidism of renal origin: Secondary | ICD-10-CM | POA: Diagnosis not present

## 2012-09-28 DIAGNOSIS — N186 End stage renal disease: Secondary | ICD-10-CM | POA: Diagnosis not present

## 2012-09-29 DIAGNOSIS — G894 Chronic pain syndrome: Secondary | ICD-10-CM | POA: Diagnosis not present

## 2012-09-29 DIAGNOSIS — N186 End stage renal disease: Secondary | ICD-10-CM | POA: Diagnosis not present

## 2012-10-03 DIAGNOSIS — D631 Anemia in chronic kidney disease: Secondary | ICD-10-CM | POA: Diagnosis not present

## 2012-10-03 DIAGNOSIS — N2581 Secondary hyperparathyroidism of renal origin: Secondary | ICD-10-CM | POA: Diagnosis not present

## 2012-10-03 DIAGNOSIS — N039 Chronic nephritic syndrome with unspecified morphologic changes: Secondary | ICD-10-CM | POA: Diagnosis not present

## 2012-10-03 DIAGNOSIS — D472 Monoclonal gammopathy: Secondary | ICD-10-CM | POA: Diagnosis not present

## 2012-10-03 DIAGNOSIS — N186 End stage renal disease: Secondary | ICD-10-CM | POA: Diagnosis not present

## 2012-10-03 DIAGNOSIS — D509 Iron deficiency anemia, unspecified: Secondary | ICD-10-CM | POA: Diagnosis not present

## 2012-10-03 DIAGNOSIS — R110 Nausea: Secondary | ICD-10-CM | POA: Diagnosis not present

## 2012-10-05 DIAGNOSIS — R110 Nausea: Secondary | ICD-10-CM | POA: Diagnosis not present

## 2012-10-05 DIAGNOSIS — D631 Anemia in chronic kidney disease: Secondary | ICD-10-CM | POA: Diagnosis not present

## 2012-10-05 DIAGNOSIS — D509 Iron deficiency anemia, unspecified: Secondary | ICD-10-CM | POA: Diagnosis not present

## 2012-10-05 DIAGNOSIS — N2581 Secondary hyperparathyroidism of renal origin: Secondary | ICD-10-CM | POA: Diagnosis not present

## 2012-10-05 DIAGNOSIS — N039 Chronic nephritic syndrome with unspecified morphologic changes: Secondary | ICD-10-CM | POA: Diagnosis not present

## 2012-10-05 DIAGNOSIS — N186 End stage renal disease: Secondary | ICD-10-CM | POA: Diagnosis not present

## 2012-10-05 DIAGNOSIS — D472 Monoclonal gammopathy: Secondary | ICD-10-CM | POA: Diagnosis not present

## 2012-10-06 ENCOUNTER — Non-Acute Institutional Stay (SKILLED_NURSING_FACILITY): Payer: Medicare Other | Admitting: Internal Medicine

## 2012-10-06 DIAGNOSIS — K219 Gastro-esophageal reflux disease without esophagitis: Secondary | ICD-10-CM

## 2012-10-06 DIAGNOSIS — I509 Heart failure, unspecified: Secondary | ICD-10-CM

## 2012-10-06 DIAGNOSIS — I471 Supraventricular tachycardia: Secondary | ICD-10-CM | POA: Diagnosis not present

## 2012-10-06 DIAGNOSIS — N63 Unspecified lump in unspecified breast: Secondary | ICD-10-CM

## 2012-10-06 DIAGNOSIS — B20 Human immunodeficiency virus [HIV] disease: Secondary | ICD-10-CM

## 2012-10-06 DIAGNOSIS — I10 Essential (primary) hypertension: Secondary | ICD-10-CM

## 2012-10-06 DIAGNOSIS — M6281 Muscle weakness (generalized): Secondary | ICD-10-CM | POA: Diagnosis not present

## 2012-10-06 DIAGNOSIS — K922 Gastrointestinal hemorrhage, unspecified: Secondary | ICD-10-CM

## 2012-10-06 DIAGNOSIS — D649 Anemia, unspecified: Secondary | ICD-10-CM

## 2012-10-06 DIAGNOSIS — R279 Unspecified lack of coordination: Secondary | ICD-10-CM | POA: Diagnosis not present

## 2012-10-06 DIAGNOSIS — N186 End stage renal disease: Secondary | ICD-10-CM | POA: Diagnosis not present

## 2012-10-06 NOTE — Progress Notes (Signed)
Patient ID: [name], female   DOB: 29-Nov-1950, 62 y.o.   MRN: [id number]  This is a routine visit.  Level of care skilled.  [place].  Chief complaint-medical management of end-stage renal disease-chronic pain-HIV-anemia-GERD-hypertension-history of breast mass-.  History of present illness.  Patient is a pleasant 62 year old female with the above diagnoses.  She has been quite stable but has had a number of issues recently--she does have a history of end-stage renal disease on hemodialysis this has been stable relatively recently.  Also has a history of HIV-AIDS which is followed by infectious disease she continues on medication for this and this appears to be stable as well.  .  She recently developed a right breast mass this has been biopsied--and felt to be fibrocystic changes.  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  Patient also was hospitalized shortly back in October 2013 after falling in the facility and sustaining a small intracranial bleed-this appears to have resolved unremarkably they just hospitalized her as a precautionary measure.  She does have a history of anemia possible GI bleed but she has essentially deferred any workup for this.  Currently today she has no acute complaints despite her numerous medical issues.  Previous medical history as been reviewed per history and physical on 12/27/2012.  Medications have been reviewed per MAR.  Review of systems.  In general denies any fever or chills her weight has been stable.  Skin-denies recent issues itching or rash.  Head-ears nose mouth and throat-does not complaining of any sore throat or dysphagia at this point or nasal discharge or allergy symptoms.  Respiratory no complaints of shortness of breath or cough.  Cardiac-does not complaining of chest pain or palpitations.  GI-does have some history of GERD with history complains of some stomach  discomfort but does not complaining of at night no nausea vomiting diarrhea or constipation complaints.  GU end-stage renal disease history but does not complaining of dysuria.  Muscle skeletal ambulates in a wheelchair does not complaining of joint pain does have a history of chronic pain here is on oxycodone routine as well as when necessary and this appears to help.  Neurologic-no complaints of headache or dizziness recent intracranial bleed but this appears to have resolved and is asymptomatic.  Psych-does complain of anxiety at times appears to be in good spirits today usually pleasant and interactive.  Physical exam.  Temperature is 97.2 pulse 76 respirations 20 blood pressure 120/68 weight is stable at 113.  In general this is a somewhat frail middle-aged female in no distress lying comfortably in bed.  Her skin is warm and dry.  Breasts-there is an area of firmness on the lower aspect of the right breast this does not appear to be warm or acutely tender.  Left breast appears to be within normal limits  She does have a shunt on the right lower arm bruit is appreciated.  Eyes pupils appear equal round react to light sclera and conjunctiva are clear visual acuity appears intact.  Oropharynx is clear mucous membranes moist.  Heart is regular rate and rhythm without murmur gallop or rub she has an occasional irregular beat.  Chest is clear to auscultation without rhonchi rales or wheezes.  Abdomen somewhat protuberant soft nontender with active bowel sounds.  Extremities she does ambulate in a wheelchair he did not note any deformities she does not have significant lower extremity edema.  Neurologic is grossly intact her speech is clear.  Psych she is alert and oriented x3 pleasant and appropriate.  Labs.  05/21/2012.  Sodium 135 potassium 4.7 BUN 54 creatinine 12.48.  Liver function tests within normal limits.  Worst 13th 2014.  WBC 2.8 hemoglobin 9.8 platelets  168.  04/12/2012.  TSH-3.29.  Assessment and plan.  #1-end-stage renal disease on chronic hemodialysis-this appears to be stable occasionally apparently she will refuse dialysis but this is not persistent trend.  #2-history of right breast mass-this apparently has been determined to be fibrocystic-monitor accordingly this has been followed again by surgery.  #3-history of hypertension-atrial fibrillation-this appears rate controlled she is on Cardizem and Lopressor-blood pressure appears well controlled.  #4-history of chronic pain-currently under juicy patch as well as OxyIR each bedtime and every 6 hours when necessary also has Roxicodone routine and when necessary this appears controlled at present she is followed by pain management doctor as well.  #5-history of HIV-AIDS-is followed by infectious disease and continues on therapy-this has been stable for some time.  #6-anemia-apparently some history of GI bleed here that she does not want worked up-will try to obtain labs from dialysis for updating including a CBC.--She does continue on Procrit  #7-history of GERD --erosive gastritis-Mallory-Weiss tear --she is on protonix. this appears to be helping.  #8-history of thyroid nodule again this was worked up and thought to be follicular followup as needed.  #9-history of intracranial bleed-again this was status post fall last autumn-this has not been an issue since her return neurologic she she appears to be intact.  #10-history of angioedema and again she was hospitalized at one point for increased swelling there have been no further episodes of this--I believe this was thought to be secondary to an ACE inhibitor which was discontinued.  #11-history colitis past again she did require hospitalization for this this was treated with vancomycin and this appears to have been asymptomatic since her hospitalization she is not on any antidiarrheals which was recommended status post  hospitalization  #12-CHF-per chart review I see a listed history of this but I do not see any evidence of this  t continue to monitor this has been stable for some time.  [id number] note greater than 50 minutes spent assessing patient-reviewing her extensive records-and coordinating plan of care for numerous diagnoses.

## 2012-10-07 DIAGNOSIS — D631 Anemia in chronic kidney disease: Secondary | ICD-10-CM | POA: Diagnosis not present

## 2012-10-07 DIAGNOSIS — D509 Iron deficiency anemia, unspecified: Secondary | ICD-10-CM | POA: Diagnosis not present

## 2012-10-07 DIAGNOSIS — D472 Monoclonal gammopathy: Secondary | ICD-10-CM | POA: Diagnosis not present

## 2012-10-07 DIAGNOSIS — N186 End stage renal disease: Secondary | ICD-10-CM | POA: Diagnosis not present

## 2012-10-07 DIAGNOSIS — R110 Nausea: Secondary | ICD-10-CM | POA: Diagnosis not present

## 2012-10-07 DIAGNOSIS — N2581 Secondary hyperparathyroidism of renal origin: Secondary | ICD-10-CM | POA: Diagnosis not present

## 2012-10-11 DIAGNOSIS — M6281 Muscle weakness (generalized): Secondary | ICD-10-CM | POA: Diagnosis not present

## 2012-10-11 DIAGNOSIS — I471 Supraventricular tachycardia: Secondary | ICD-10-CM | POA: Diagnosis not present

## 2012-10-11 DIAGNOSIS — R279 Unspecified lack of coordination: Secondary | ICD-10-CM | POA: Diagnosis not present

## 2012-10-12 DIAGNOSIS — D509 Iron deficiency anemia, unspecified: Secondary | ICD-10-CM | POA: Diagnosis not present

## 2012-10-12 DIAGNOSIS — D472 Monoclonal gammopathy: Secondary | ICD-10-CM | POA: Diagnosis not present

## 2012-10-12 DIAGNOSIS — R110 Nausea: Secondary | ICD-10-CM | POA: Diagnosis not present

## 2012-10-12 DIAGNOSIS — D631 Anemia in chronic kidney disease: Secondary | ICD-10-CM | POA: Diagnosis not present

## 2012-10-12 DIAGNOSIS — N186 End stage renal disease: Secondary | ICD-10-CM | POA: Diagnosis not present

## 2012-10-12 DIAGNOSIS — N2581 Secondary hyperparathyroidism of renal origin: Secondary | ICD-10-CM | POA: Diagnosis not present

## 2012-10-13 DIAGNOSIS — I471 Supraventricular tachycardia: Secondary | ICD-10-CM | POA: Diagnosis not present

## 2012-10-13 DIAGNOSIS — R279 Unspecified lack of coordination: Secondary | ICD-10-CM | POA: Diagnosis not present

## 2012-10-13 DIAGNOSIS — M6281 Muscle weakness (generalized): Secondary | ICD-10-CM | POA: Diagnosis not present

## 2012-10-14 ENCOUNTER — Other Ambulatory Visit: Payer: Self-pay | Admitting: *Deleted

## 2012-10-14 DIAGNOSIS — R110 Nausea: Secondary | ICD-10-CM | POA: Diagnosis not present

## 2012-10-14 DIAGNOSIS — D509 Iron deficiency anemia, unspecified: Secondary | ICD-10-CM | POA: Diagnosis not present

## 2012-10-14 DIAGNOSIS — D631 Anemia in chronic kidney disease: Secondary | ICD-10-CM | POA: Diagnosis not present

## 2012-10-14 DIAGNOSIS — N2581 Secondary hyperparathyroidism of renal origin: Secondary | ICD-10-CM | POA: Diagnosis not present

## 2012-10-14 DIAGNOSIS — D472 Monoclonal gammopathy: Secondary | ICD-10-CM | POA: Diagnosis not present

## 2012-10-14 DIAGNOSIS — N186 End stage renal disease: Secondary | ICD-10-CM | POA: Diagnosis not present

## 2012-10-14 MED ORDER — ZOLPIDEM TARTRATE 5 MG PO TABS: ORAL_TABLET | ORAL | Status: DC

## 2012-10-17 DIAGNOSIS — R110 Nausea: Secondary | ICD-10-CM | POA: Diagnosis not present

## 2012-10-17 DIAGNOSIS — N2581 Secondary hyperparathyroidism of renal origin: Secondary | ICD-10-CM | POA: Diagnosis not present

## 2012-10-17 DIAGNOSIS — I471 Supraventricular tachycardia: Secondary | ICD-10-CM | POA: Diagnosis not present

## 2012-10-17 DIAGNOSIS — D472 Monoclonal gammopathy: Secondary | ICD-10-CM | POA: Diagnosis not present

## 2012-10-17 DIAGNOSIS — R279 Unspecified lack of coordination: Secondary | ICD-10-CM | POA: Diagnosis not present

## 2012-10-17 DIAGNOSIS — M6281 Muscle weakness (generalized): Secondary | ICD-10-CM | POA: Diagnosis not present

## 2012-10-17 DIAGNOSIS — D509 Iron deficiency anemia, unspecified: Secondary | ICD-10-CM | POA: Diagnosis not present

## 2012-10-17 DIAGNOSIS — N186 End stage renal disease: Secondary | ICD-10-CM | POA: Diagnosis not present

## 2012-10-17 DIAGNOSIS — D631 Anemia in chronic kidney disease: Secondary | ICD-10-CM | POA: Diagnosis not present

## 2012-10-19 DIAGNOSIS — D631 Anemia in chronic kidney disease: Secondary | ICD-10-CM | POA: Diagnosis not present

## 2012-10-19 DIAGNOSIS — N2581 Secondary hyperparathyroidism of renal origin: Secondary | ICD-10-CM | POA: Diagnosis not present

## 2012-10-19 DIAGNOSIS — D472 Monoclonal gammopathy: Secondary | ICD-10-CM | POA: Diagnosis not present

## 2012-10-19 DIAGNOSIS — N186 End stage renal disease: Secondary | ICD-10-CM | POA: Diagnosis not present

## 2012-10-19 DIAGNOSIS — R110 Nausea: Secondary | ICD-10-CM | POA: Diagnosis not present

## 2012-10-19 DIAGNOSIS — D509 Iron deficiency anemia, unspecified: Secondary | ICD-10-CM | POA: Diagnosis not present

## 2012-10-21 DIAGNOSIS — R110 Nausea: Secondary | ICD-10-CM | POA: Diagnosis not present

## 2012-10-21 DIAGNOSIS — D631 Anemia in chronic kidney disease: Secondary | ICD-10-CM | POA: Diagnosis not present

## 2012-10-21 DIAGNOSIS — D509 Iron deficiency anemia, unspecified: Secondary | ICD-10-CM | POA: Diagnosis not present

## 2012-10-21 DIAGNOSIS — N2581 Secondary hyperparathyroidism of renal origin: Secondary | ICD-10-CM | POA: Diagnosis not present

## 2012-10-21 DIAGNOSIS — N186 End stage renal disease: Secondary | ICD-10-CM | POA: Diagnosis not present

## 2012-10-21 DIAGNOSIS — D472 Monoclonal gammopathy: Secondary | ICD-10-CM | POA: Diagnosis not present

## 2012-10-24 DIAGNOSIS — D509 Iron deficiency anemia, unspecified: Secondary | ICD-10-CM | POA: Diagnosis not present

## 2012-10-24 DIAGNOSIS — D631 Anemia in chronic kidney disease: Secondary | ICD-10-CM | POA: Diagnosis not present

## 2012-10-24 DIAGNOSIS — N186 End stage renal disease: Secondary | ICD-10-CM | POA: Diagnosis not present

## 2012-10-24 DIAGNOSIS — D472 Monoclonal gammopathy: Secondary | ICD-10-CM | POA: Diagnosis not present

## 2012-10-24 DIAGNOSIS — R110 Nausea: Secondary | ICD-10-CM | POA: Diagnosis not present

## 2012-10-24 DIAGNOSIS — N2581 Secondary hyperparathyroidism of renal origin: Secondary | ICD-10-CM | POA: Diagnosis not present

## 2012-10-26 DIAGNOSIS — N2581 Secondary hyperparathyroidism of renal origin: Secondary | ICD-10-CM | POA: Diagnosis not present

## 2012-10-26 DIAGNOSIS — D472 Monoclonal gammopathy: Secondary | ICD-10-CM | POA: Diagnosis not present

## 2012-10-26 DIAGNOSIS — R110 Nausea: Secondary | ICD-10-CM | POA: Diagnosis not present

## 2012-10-26 DIAGNOSIS — N186 End stage renal disease: Secondary | ICD-10-CM | POA: Diagnosis not present

## 2012-10-26 DIAGNOSIS — D631 Anemia in chronic kidney disease: Secondary | ICD-10-CM | POA: Diagnosis not present

## 2012-10-26 DIAGNOSIS — D509 Iron deficiency anemia, unspecified: Secondary | ICD-10-CM | POA: Diagnosis not present

## 2012-10-28 DIAGNOSIS — D472 Monoclonal gammopathy: Secondary | ICD-10-CM | POA: Diagnosis not present

## 2012-10-28 DIAGNOSIS — E782 Mixed hyperlipidemia: Secondary | ICD-10-CM | POA: Diagnosis not present

## 2012-10-28 DIAGNOSIS — R110 Nausea: Secondary | ICD-10-CM | POA: Diagnosis not present

## 2012-10-28 DIAGNOSIS — N186 End stage renal disease: Secondary | ICD-10-CM | POA: Diagnosis not present

## 2012-10-28 DIAGNOSIS — D631 Anemia in chronic kidney disease: Secondary | ICD-10-CM | POA: Diagnosis not present

## 2012-10-28 DIAGNOSIS — N2581 Secondary hyperparathyroidism of renal origin: Secondary | ICD-10-CM | POA: Diagnosis not present

## 2012-10-28 DIAGNOSIS — B20 Human immunodeficiency virus [HIV] disease: Secondary | ICD-10-CM | POA: Diagnosis not present

## 2012-10-28 DIAGNOSIS — D509 Iron deficiency anemia, unspecified: Secondary | ICD-10-CM | POA: Diagnosis not present

## 2012-10-30 DIAGNOSIS — N186 End stage renal disease: Secondary | ICD-10-CM | POA: Diagnosis not present

## 2012-10-31 DIAGNOSIS — Z23 Encounter for immunization: Secondary | ICD-10-CM | POA: Diagnosis not present

## 2012-10-31 DIAGNOSIS — N186 End stage renal disease: Secondary | ICD-10-CM | POA: Diagnosis not present

## 2012-10-31 DIAGNOSIS — D472 Monoclonal gammopathy: Secondary | ICD-10-CM | POA: Diagnosis not present

## 2012-10-31 DIAGNOSIS — N2581 Secondary hyperparathyroidism of renal origin: Secondary | ICD-10-CM | POA: Diagnosis not present

## 2012-10-31 DIAGNOSIS — D509 Iron deficiency anemia, unspecified: Secondary | ICD-10-CM | POA: Diagnosis not present

## 2012-10-31 DIAGNOSIS — D631 Anemia in chronic kidney disease: Secondary | ICD-10-CM | POA: Diagnosis not present

## 2012-11-02 ENCOUNTER — Ambulatory Visit: Payer: Medicare Other | Admitting: Infectious Diseases

## 2012-11-02 DIAGNOSIS — D509 Iron deficiency anemia, unspecified: Secondary | ICD-10-CM | POA: Diagnosis not present

## 2012-11-02 DIAGNOSIS — N2581 Secondary hyperparathyroidism of renal origin: Secondary | ICD-10-CM | POA: Diagnosis not present

## 2012-11-02 DIAGNOSIS — D631 Anemia in chronic kidney disease: Secondary | ICD-10-CM | POA: Diagnosis not present

## 2012-11-02 DIAGNOSIS — N186 End stage renal disease: Secondary | ICD-10-CM | POA: Diagnosis not present

## 2012-11-02 DIAGNOSIS — Z23 Encounter for immunization: Secondary | ICD-10-CM | POA: Diagnosis not present

## 2012-11-02 DIAGNOSIS — D472 Monoclonal gammopathy: Secondary | ICD-10-CM | POA: Diagnosis not present

## 2012-11-07 DIAGNOSIS — D472 Monoclonal gammopathy: Secondary | ICD-10-CM | POA: Diagnosis not present

## 2012-11-07 DIAGNOSIS — D509 Iron deficiency anemia, unspecified: Secondary | ICD-10-CM | POA: Diagnosis not present

## 2012-11-07 DIAGNOSIS — N186 End stage renal disease: Secondary | ICD-10-CM | POA: Diagnosis not present

## 2012-11-07 DIAGNOSIS — N2581 Secondary hyperparathyroidism of renal origin: Secondary | ICD-10-CM | POA: Diagnosis not present

## 2012-11-07 DIAGNOSIS — D631 Anemia in chronic kidney disease: Secondary | ICD-10-CM | POA: Diagnosis not present

## 2012-11-07 DIAGNOSIS — Z23 Encounter for immunization: Secondary | ICD-10-CM | POA: Diagnosis not present

## 2012-11-10 DIAGNOSIS — G894 Chronic pain syndrome: Secondary | ICD-10-CM | POA: Diagnosis not present

## 2012-11-11 DIAGNOSIS — D509 Iron deficiency anemia, unspecified: Secondary | ICD-10-CM | POA: Diagnosis not present

## 2012-11-11 DIAGNOSIS — D631 Anemia in chronic kidney disease: Secondary | ICD-10-CM | POA: Diagnosis not present

## 2012-11-11 DIAGNOSIS — D472 Monoclonal gammopathy: Secondary | ICD-10-CM | POA: Diagnosis not present

## 2012-11-11 DIAGNOSIS — N2581 Secondary hyperparathyroidism of renal origin: Secondary | ICD-10-CM | POA: Diagnosis not present

## 2012-11-11 DIAGNOSIS — Z23 Encounter for immunization: Secondary | ICD-10-CM | POA: Diagnosis not present

## 2012-11-11 DIAGNOSIS — N186 End stage renal disease: Secondary | ICD-10-CM | POA: Diagnosis not present

## 2012-11-14 DIAGNOSIS — D472 Monoclonal gammopathy: Secondary | ICD-10-CM | POA: Diagnosis not present

## 2012-11-14 DIAGNOSIS — N2581 Secondary hyperparathyroidism of renal origin: Secondary | ICD-10-CM | POA: Diagnosis not present

## 2012-11-14 DIAGNOSIS — N186 End stage renal disease: Secondary | ICD-10-CM | POA: Diagnosis not present

## 2012-11-14 DIAGNOSIS — Z23 Encounter for immunization: Secondary | ICD-10-CM | POA: Diagnosis not present

## 2012-11-14 DIAGNOSIS — D631 Anemia in chronic kidney disease: Secondary | ICD-10-CM | POA: Diagnosis not present

## 2012-11-14 DIAGNOSIS — D509 Iron deficiency anemia, unspecified: Secondary | ICD-10-CM | POA: Diagnosis not present

## 2012-11-18 DIAGNOSIS — D472 Monoclonal gammopathy: Secondary | ICD-10-CM | POA: Diagnosis not present

## 2012-11-18 DIAGNOSIS — N2581 Secondary hyperparathyroidism of renal origin: Secondary | ICD-10-CM | POA: Diagnosis not present

## 2012-11-18 DIAGNOSIS — D631 Anemia in chronic kidney disease: Secondary | ICD-10-CM | POA: Diagnosis not present

## 2012-11-18 DIAGNOSIS — N186 End stage renal disease: Secondary | ICD-10-CM | POA: Diagnosis not present

## 2012-11-18 DIAGNOSIS — Z23 Encounter for immunization: Secondary | ICD-10-CM | POA: Diagnosis not present

## 2012-11-18 DIAGNOSIS — D509 Iron deficiency anemia, unspecified: Secondary | ICD-10-CM | POA: Diagnosis not present

## 2012-11-23 DIAGNOSIS — Z23 Encounter for immunization: Secondary | ICD-10-CM | POA: Diagnosis not present

## 2012-11-23 DIAGNOSIS — D472 Monoclonal gammopathy: Secondary | ICD-10-CM | POA: Diagnosis not present

## 2012-11-23 DIAGNOSIS — N2581 Secondary hyperparathyroidism of renal origin: Secondary | ICD-10-CM | POA: Diagnosis not present

## 2012-11-23 DIAGNOSIS — D509 Iron deficiency anemia, unspecified: Secondary | ICD-10-CM | POA: Diagnosis not present

## 2012-11-23 DIAGNOSIS — D631 Anemia in chronic kidney disease: Secondary | ICD-10-CM | POA: Diagnosis not present

## 2012-11-23 DIAGNOSIS — N186 End stage renal disease: Secondary | ICD-10-CM | POA: Diagnosis not present

## 2012-11-25 ENCOUNTER — Encounter: Payer: Self-pay | Admitting: Internal Medicine

## 2012-11-25 DIAGNOSIS — I509 Heart failure, unspecified: Secondary | ICD-10-CM | POA: Insufficient documentation

## 2012-11-28 ENCOUNTER — Non-Acute Institutional Stay (SKILLED_NURSING_FACILITY): Payer: Medicare Other | Admitting: Internal Medicine

## 2012-11-28 DIAGNOSIS — F3289 Other specified depressive episodes: Secondary | ICD-10-CM

## 2012-11-28 DIAGNOSIS — N186 End stage renal disease: Secondary | ICD-10-CM

## 2012-11-28 DIAGNOSIS — F329 Major depressive disorder, single episode, unspecified: Secondary | ICD-10-CM

## 2012-11-28 DIAGNOSIS — F450 Somatization disorder: Secondary | ICD-10-CM

## 2012-11-28 DIAGNOSIS — B20 Human immunodeficiency virus [HIV] disease: Secondary | ICD-10-CM | POA: Diagnosis not present

## 2012-11-28 DIAGNOSIS — F32A Depression, unspecified: Secondary | ICD-10-CM

## 2012-11-29 DIAGNOSIS — N186 End stage renal disease: Secondary | ICD-10-CM | POA: Diagnosis not present

## 2012-11-30 DIAGNOSIS — Z992 Dependence on renal dialysis: Secondary | ICD-10-CM | POA: Diagnosis not present

## 2012-11-30 DIAGNOSIS — D509 Iron deficiency anemia, unspecified: Secondary | ICD-10-CM | POA: Diagnosis not present

## 2012-11-30 DIAGNOSIS — N2581 Secondary hyperparathyroidism of renal origin: Secondary | ICD-10-CM | POA: Diagnosis not present

## 2012-11-30 DIAGNOSIS — N186 End stage renal disease: Secondary | ICD-10-CM | POA: Diagnosis not present

## 2012-11-30 DIAGNOSIS — R110 Nausea: Secondary | ICD-10-CM | POA: Diagnosis not present

## 2012-11-30 DIAGNOSIS — D472 Monoclonal gammopathy: Secondary | ICD-10-CM | POA: Diagnosis not present

## 2012-11-30 DIAGNOSIS — D631 Anemia in chronic kidney disease: Secondary | ICD-10-CM | POA: Diagnosis not present

## 2012-12-05 ENCOUNTER — Non-Acute Institutional Stay (SKILLED_NURSING_FACILITY): Payer: Medicare Other | Admitting: Internal Medicine

## 2012-12-05 DIAGNOSIS — N186 End stage renal disease: Secondary | ICD-10-CM

## 2012-12-05 DIAGNOSIS — R112 Nausea with vomiting, unspecified: Secondary | ICD-10-CM | POA: Diagnosis not present

## 2012-12-05 NOTE — Progress Notes (Signed)
Patient ID: [name], female   DOB: Aug 22, 1950, 62 y.o.   MRN: [id number]           PROGRESS NOTE  DATE:  11/28/2012  FACILITY: [name]    LEVEL OF CARE:   SNF   Acute Visit   CHIEF COMPLAINT:  Refusals of medications, refusals to go to dialysis, etc.     HISTORY OF PRESENT ILLNESS:  [name] is a complex, 62 year-old woman with multiple medical issues including HIV and chronic renal failure, on dialysis.    She was hospitalized in July from the 17th through the 19th.  She was sent from the [facility] to the ER due to SVT.  She was admitted to hospital.  Her cardiac enzymes were negative, but she had marked ST depression and chest pain.  She had a cardiac cath done that showed clean coronary arteries.  She was given oral metoprolol and diltiazem, and her amlodipine was stopped, and that seems to have stabilized her.    Over the course of September, she has refused dialysis on multiple occasions including today.  Looking at her medications, it seems that she is refusing her HIV medications more than half the time.  She does seem to take her Fentanyl patch, however.      CURRENT MEDICATIONS:  Medication list is reviewed.   This includes:    Revatio 20 mg three times a day.    Retrovir 100 mg three times a day.    Duragesic 25 mg q.72.    Diltiazem 60 q.8.    Fosrenol 500, 1 tablet every night at bedtime.    Roxicodone 5 mg p.o. at bedtime.    Raltegravir 400 twice daily.    Metoprolol 50 b.i.d.    PhosLo 667, 2 capsules three times a day.    REVIEW OF SYSTEMS:   CHEST/RESPIRATORY:  She is not coughing.   CARDIAC:   No chest pain or palpitations.   GI:  She states she feels constantly nauseated.  Does not like the food, the food does not taste good, etc.  She does not have abdominal pain.    PHYSICAL EXAMINATION:   VITAL SIGNS:  PULSE:  72.   GENERAL APPEARANCE:  She is not in any distress.   CHEST/RESPIRATORY:  Clear air entry  bilaterally.   CARDIOVASCULAR:  CARDIAC:   Heart sounds are normal.  There are no murmurs.   GASTROINTESTINAL:  ABDOMEN:   Soft.  No tenderness.   PSYCHIATRIC:   MENTAL STATUS:   This woman is very depressed.  I think this is certainly the major issue here.    ASSESSMENT/PLAN:  Major depression.  I think this is behind a lot of how this woman feels although, with her inadequate dialysis, I am sure this is contributing to the general sense of unwellness.  I am going to start her on Cymbalta, which should help with her pain provided there are no drug interactions, which I will try to check.    Chronic renal failure.  She expressed her wish to continue on dialysis.  When people do inadequate dialysis, they tend to feel poorly.  I wonder how much of this is contributing to how she feels.  Last lab work from March showed a BUN of 54, a creatinine of 12.48.  I will check a basic metabolic panel on her.    HIV.  I see there was somebody who ordered HIV RNA by PCR.  I do  not have these results and I am not exactly sure who is following her now.    We will start her on an antidepressant.  I have encouraged her to attend dialysis.  Otherwise, she will continue to decline.     CPT CODE: 91478

## 2012-12-07 ENCOUNTER — Ambulatory Visit: Payer: Medicare Other | Admitting: Cardiology

## 2012-12-07 DIAGNOSIS — N186 End stage renal disease: Secondary | ICD-10-CM | POA: Diagnosis not present

## 2012-12-08 ENCOUNTER — Ambulatory Visit: Payer: Medicare Other | Admitting: Infectious Diseases

## 2012-12-15 ENCOUNTER — Ambulatory Visit (INDEPENDENT_AMBULATORY_CARE_PROVIDER_SITE_OTHER): Payer: Medicare Other | Admitting: Infectious Diseases

## 2012-12-15 ENCOUNTER — Encounter: Payer: Self-pay | Admitting: Infectious Diseases

## 2012-12-15 VITALS — BP 153/104 | HR 56 | Temp 98.3°F | Ht 62.0 in | Wt 108.0 lb

## 2012-12-15 DIAGNOSIS — R112 Nausea with vomiting, unspecified: Secondary | ICD-10-CM | POA: Insufficient documentation

## 2012-12-15 DIAGNOSIS — G894 Chronic pain syndrome: Secondary | ICD-10-CM | POA: Diagnosis not present

## 2012-12-15 DIAGNOSIS — B20 Human immunodeficiency virus [HIV] disease: Secondary | ICD-10-CM

## 2012-12-15 DIAGNOSIS — N186 End stage renal disease: Secondary | ICD-10-CM

## 2012-12-15 DIAGNOSIS — F329 Major depressive disorder, single episode, unspecified: Secondary | ICD-10-CM

## 2012-12-15 DIAGNOSIS — R1110 Vomiting, unspecified: Secondary | ICD-10-CM | POA: Insufficient documentation

## 2012-12-15 DIAGNOSIS — F3289 Other specified depressive episodes: Secondary | ICD-10-CM

## 2012-12-15 MED ORDER — ONDANSETRON 4 MG PO TBDP: 4.0000 mg | ORAL_TABLET | Freq: Three times a day (TID) | ORAL | Status: DC | PRN

## 2012-12-15 NOTE — Assessment & Plan Note (Signed)
Pt becomes tearful, talks about people who she knows (who were her friends) who won't have anything to do with her because she has HIV.

## 2012-12-15 NOTE — Assessment & Plan Note (Signed)
Will give her trial of zofran.

## 2012-12-15 NOTE — Assessment & Plan Note (Signed)
encouraged her not to miss HD.

## 2012-12-15 NOTE — Progress Notes (Signed)
  Subjective:    Patient ID: [name], female    DOB: 05/10/1950, 62 y.o.   MRN: [id number]  HPI 62 yo F with HIV+, ESRD, staying at SNF. She is on ISN/AZT/3TC dosed for her HD. CD4 202, HIV RNA <20.  Chol 119, Trig 53.  Has been refusing her meds and her HD at her SNF. Misses HD 1/3 HD sessions. She was started on cymbalta. Still feeling nauseated and "throwing up". Feeling weak.   Review of Systems  Constitutional: Positive for appetite change.  Gastrointestinal: Positive for nausea and vomiting. Negative for constipation and blood in stool.  Genitourinary: Negative for difficulty urinating.       Objective:   Physical Exam  Constitutional: She appears well-developed and well-nourished.  HENT:  Mouth/Throat: No oropharyngeal exudate.  Eyes: EOM are normal. Pupils are equal, round, and reactive to light.  Neck: Neck supple.  Cardiovascular: Normal rate and normal heart sounds.   Pulmonary/Chest: Effort normal and breath sounds normal.  Abdominal: Soft. Bowel sounds are normal. She exhibits no distension.  Musculoskeletal: She exhibits no edema.  Lymphadenopathy:    She has no cervical adenopathy.          Assessment & Plan:

## 2012-12-15 NOTE — Assessment & Plan Note (Signed)
Has gotten flu shot. Doing well on ART, some decrease in CD4. Will give her zofran for her nausea. Will see her back in 6 months.

## 2012-12-19 NOTE — Progress Notes (Signed)
Patient ID: [name], female   DOB: 11-09-1950, 62 y.o.   MRN: [id number]           PROGRESS NOTE  DATE:  12/05/2012  FACILITY: [name]    LEVEL OF CARE:   SNF   Acute Visit   CHIEF COMPLAINT:  Dialysis refusals.    HISTORY OF PRESENT ILLNESS:  I saw [name] a week ago with regards to refusals of dialysis, also refusals of her HIV medications.  I found her to be depressed and put her on Cymbalta.  Apparently, the situation has not improved.  The facility received a call from her nephrologist, who states that she will be discontinued from dialysis if this continues.  There are patients waiting to begin dialysis and her spot is sorely needed.  I certainly understand this line of thinking.  I discussed this with her last week, even before this call was made.    REVIEW OF SYSTEMS:   CHEST/RESPIRATORY:  She is not complaining of shortness of breath.   CARDIAC:   No clear chest pain.   GI:  She states she is having postprandial nausea and some vomiting without hematemesis.    ASSESSMENT/PLAN:  Nausea and vomiting.  This could be due to increasing uremia.    Noncompliance with dialysis.  I really am forced to agree with her nephrologist, albeit with some reticence that if she does not attend dialysis, he will need to discontinue it, especially if there is demand from other people who need to initiate dialysis.  I have discussed this with the patient in some detail.    Some upper abdominal distention.  Bowel sounds are positive.  There are certainly no acute findings in her abdomen.  I would like her to be on dialysis before I engage in any further diagnostic tests or imaging.   If she continues along this path, I think we will need to call Hospice and I told her this.    CPT CODE: 52841

## 2012-12-25 DIAGNOSIS — F411 Generalized anxiety disorder: Secondary | ICD-10-CM | POA: Diagnosis not present

## 2012-12-25 DIAGNOSIS — F321 Major depressive disorder, single episode, moderate: Secondary | ICD-10-CM | POA: Diagnosis not present

## 2012-12-30 ENCOUNTER — Other Ambulatory Visit: Payer: Self-pay

## 2012-12-30 DIAGNOSIS — N186 End stage renal disease: Secondary | ICD-10-CM | POA: Diagnosis not present

## 2012-12-30 MED ORDER — ZOLPIDEM TARTRATE 5 MG PO TABS: ORAL_TABLET | ORAL | Status: DC

## 2013-01-02 DIAGNOSIS — R1110 Vomiting, unspecified: Secondary | ICD-10-CM | POA: Diagnosis not present

## 2013-01-02 DIAGNOSIS — N2581 Secondary hyperparathyroidism of renal origin: Secondary | ICD-10-CM | POA: Diagnosis not present

## 2013-01-02 DIAGNOSIS — D631 Anemia in chronic kidney disease: Secondary | ICD-10-CM | POA: Diagnosis not present

## 2013-01-02 DIAGNOSIS — D509 Iron deficiency anemia, unspecified: Secondary | ICD-10-CM | POA: Diagnosis not present

## 2013-01-02 DIAGNOSIS — N186 End stage renal disease: Secondary | ICD-10-CM | POA: Diagnosis not present

## 2013-01-02 DIAGNOSIS — D472 Monoclonal gammopathy: Secondary | ICD-10-CM | POA: Diagnosis not present

## 2013-01-04 DIAGNOSIS — N186 End stage renal disease: Secondary | ICD-10-CM | POA: Diagnosis not present

## 2013-01-04 DIAGNOSIS — R1110 Vomiting, unspecified: Secondary | ICD-10-CM | POA: Diagnosis not present

## 2013-01-04 DIAGNOSIS — N2581 Secondary hyperparathyroidism of renal origin: Secondary | ICD-10-CM | POA: Diagnosis not present

## 2013-01-04 DIAGNOSIS — D631 Anemia in chronic kidney disease: Secondary | ICD-10-CM | POA: Diagnosis not present

## 2013-01-04 DIAGNOSIS — D509 Iron deficiency anemia, unspecified: Secondary | ICD-10-CM | POA: Diagnosis not present

## 2013-01-04 DIAGNOSIS — D472 Monoclonal gammopathy: Secondary | ICD-10-CM | POA: Diagnosis not present

## 2013-01-06 DIAGNOSIS — D472 Monoclonal gammopathy: Secondary | ICD-10-CM | POA: Diagnosis not present

## 2013-01-06 DIAGNOSIS — N2581 Secondary hyperparathyroidism of renal origin: Secondary | ICD-10-CM | POA: Diagnosis not present

## 2013-01-06 DIAGNOSIS — D509 Iron deficiency anemia, unspecified: Secondary | ICD-10-CM | POA: Diagnosis not present

## 2013-01-06 DIAGNOSIS — D631 Anemia in chronic kidney disease: Secondary | ICD-10-CM | POA: Diagnosis not present

## 2013-01-06 DIAGNOSIS — N186 End stage renal disease: Secondary | ICD-10-CM | POA: Diagnosis not present

## 2013-01-06 DIAGNOSIS — R1110 Vomiting, unspecified: Secondary | ICD-10-CM | POA: Diagnosis not present

## 2013-01-09 DIAGNOSIS — D509 Iron deficiency anemia, unspecified: Secondary | ICD-10-CM | POA: Diagnosis not present

## 2013-01-09 DIAGNOSIS — D631 Anemia in chronic kidney disease: Secondary | ICD-10-CM | POA: Diagnosis not present

## 2013-01-09 DIAGNOSIS — D472 Monoclonal gammopathy: Secondary | ICD-10-CM | POA: Diagnosis not present

## 2013-01-09 DIAGNOSIS — R1110 Vomiting, unspecified: Secondary | ICD-10-CM | POA: Diagnosis not present

## 2013-01-09 DIAGNOSIS — N2581 Secondary hyperparathyroidism of renal origin: Secondary | ICD-10-CM | POA: Diagnosis not present

## 2013-01-09 DIAGNOSIS — N186 End stage renal disease: Secondary | ICD-10-CM | POA: Diagnosis not present

## 2013-01-11 DIAGNOSIS — D509 Iron deficiency anemia, unspecified: Secondary | ICD-10-CM | POA: Diagnosis not present

## 2013-01-11 DIAGNOSIS — N2581 Secondary hyperparathyroidism of renal origin: Secondary | ICD-10-CM | POA: Diagnosis not present

## 2013-01-11 DIAGNOSIS — D472 Monoclonal gammopathy: Secondary | ICD-10-CM | POA: Diagnosis not present

## 2013-01-11 DIAGNOSIS — N186 End stage renal disease: Secondary | ICD-10-CM | POA: Diagnosis not present

## 2013-01-11 DIAGNOSIS — R1110 Vomiting, unspecified: Secondary | ICD-10-CM | POA: Diagnosis not present

## 2013-01-11 DIAGNOSIS — D631 Anemia in chronic kidney disease: Secondary | ICD-10-CM | POA: Diagnosis not present

## 2013-01-12 DIAGNOSIS — G894 Chronic pain syndrome: Secondary | ICD-10-CM | POA: Diagnosis not present

## 2013-01-13 DIAGNOSIS — N2581 Secondary hyperparathyroidism of renal origin: Secondary | ICD-10-CM | POA: Diagnosis not present

## 2013-01-13 DIAGNOSIS — D509 Iron deficiency anemia, unspecified: Secondary | ICD-10-CM | POA: Diagnosis not present

## 2013-01-13 DIAGNOSIS — R1110 Vomiting, unspecified: Secondary | ICD-10-CM | POA: Diagnosis not present

## 2013-01-13 DIAGNOSIS — D631 Anemia in chronic kidney disease: Secondary | ICD-10-CM | POA: Diagnosis not present

## 2013-01-13 DIAGNOSIS — D472 Monoclonal gammopathy: Secondary | ICD-10-CM | POA: Diagnosis not present

## 2013-01-13 DIAGNOSIS — N186 End stage renal disease: Secondary | ICD-10-CM | POA: Diagnosis not present

## 2013-01-16 DIAGNOSIS — N186 End stage renal disease: Secondary | ICD-10-CM | POA: Diagnosis not present

## 2013-01-16 DIAGNOSIS — D631 Anemia in chronic kidney disease: Secondary | ICD-10-CM | POA: Diagnosis not present

## 2013-01-16 DIAGNOSIS — D509 Iron deficiency anemia, unspecified: Secondary | ICD-10-CM | POA: Diagnosis not present

## 2013-01-16 DIAGNOSIS — D472 Monoclonal gammopathy: Secondary | ICD-10-CM | POA: Diagnosis not present

## 2013-01-16 DIAGNOSIS — N2581 Secondary hyperparathyroidism of renal origin: Secondary | ICD-10-CM | POA: Diagnosis not present

## 2013-01-16 DIAGNOSIS — R1110 Vomiting, unspecified: Secondary | ICD-10-CM | POA: Diagnosis not present

## 2013-01-20 DIAGNOSIS — R1110 Vomiting, unspecified: Secondary | ICD-10-CM | POA: Diagnosis not present

## 2013-01-20 DIAGNOSIS — D631 Anemia in chronic kidney disease: Secondary | ICD-10-CM | POA: Diagnosis not present

## 2013-01-20 DIAGNOSIS — N186 End stage renal disease: Secondary | ICD-10-CM | POA: Diagnosis not present

## 2013-01-20 DIAGNOSIS — D509 Iron deficiency anemia, unspecified: Secondary | ICD-10-CM | POA: Diagnosis not present

## 2013-01-20 DIAGNOSIS — D472 Monoclonal gammopathy: Secondary | ICD-10-CM | POA: Diagnosis not present

## 2013-01-20 DIAGNOSIS — N2581 Secondary hyperparathyroidism of renal origin: Secondary | ICD-10-CM | POA: Diagnosis not present

## 2013-01-22 DIAGNOSIS — F321 Major depressive disorder, single episode, moderate: Secondary | ICD-10-CM | POA: Diagnosis not present

## 2013-01-22 DIAGNOSIS — F411 Generalized anxiety disorder: Secondary | ICD-10-CM | POA: Diagnosis not present

## 2013-01-25 DIAGNOSIS — D509 Iron deficiency anemia, unspecified: Secondary | ICD-10-CM | POA: Diagnosis not present

## 2013-01-25 DIAGNOSIS — N186 End stage renal disease: Secondary | ICD-10-CM | POA: Diagnosis not present

## 2013-01-25 DIAGNOSIS — R1110 Vomiting, unspecified: Secondary | ICD-10-CM | POA: Diagnosis not present

## 2013-01-25 DIAGNOSIS — N2581 Secondary hyperparathyroidism of renal origin: Secondary | ICD-10-CM | POA: Diagnosis not present

## 2013-01-25 DIAGNOSIS — D631 Anemia in chronic kidney disease: Secondary | ICD-10-CM | POA: Diagnosis not present

## 2013-01-25 DIAGNOSIS — D472 Monoclonal gammopathy: Secondary | ICD-10-CM | POA: Diagnosis not present

## 2013-01-29 DIAGNOSIS — N186 End stage renal disease: Secondary | ICD-10-CM | POA: Diagnosis not present

## 2013-01-30 DIAGNOSIS — Z992 Dependence on renal dialysis: Secondary | ICD-10-CM | POA: Diagnosis not present

## 2013-01-30 DIAGNOSIS — D631 Anemia in chronic kidney disease: Secondary | ICD-10-CM | POA: Diagnosis not present

## 2013-01-30 DIAGNOSIS — N186 End stage renal disease: Secondary | ICD-10-CM | POA: Diagnosis not present

## 2013-01-30 DIAGNOSIS — D472 Monoclonal gammopathy: Secondary | ICD-10-CM | POA: Diagnosis not present

## 2013-01-30 DIAGNOSIS — N2581 Secondary hyperparathyroidism of renal origin: Secondary | ICD-10-CM | POA: Diagnosis not present

## 2013-02-03 DIAGNOSIS — D472 Monoclonal gammopathy: Secondary | ICD-10-CM | POA: Diagnosis not present

## 2013-02-03 DIAGNOSIS — Z992 Dependence on renal dialysis: Secondary | ICD-10-CM | POA: Diagnosis not present

## 2013-02-03 DIAGNOSIS — N2581 Secondary hyperparathyroidism of renal origin: Secondary | ICD-10-CM | POA: Diagnosis not present

## 2013-02-03 DIAGNOSIS — N186 End stage renal disease: Secondary | ICD-10-CM | POA: Diagnosis not present

## 2013-02-03 DIAGNOSIS — D631 Anemia in chronic kidney disease: Secondary | ICD-10-CM | POA: Diagnosis not present

## 2013-02-06 DIAGNOSIS — D631 Anemia in chronic kidney disease: Secondary | ICD-10-CM | POA: Diagnosis not present

## 2013-02-06 DIAGNOSIS — N2581 Secondary hyperparathyroidism of renal origin: Secondary | ICD-10-CM | POA: Diagnosis not present

## 2013-02-06 DIAGNOSIS — N186 End stage renal disease: Secondary | ICD-10-CM | POA: Diagnosis not present

## 2013-02-06 DIAGNOSIS — Z992 Dependence on renal dialysis: Secondary | ICD-10-CM | POA: Diagnosis not present

## 2013-02-06 DIAGNOSIS — D472 Monoclonal gammopathy: Secondary | ICD-10-CM | POA: Diagnosis not present

## 2013-02-09 DIAGNOSIS — G894 Chronic pain syndrome: Secondary | ICD-10-CM | POA: Diagnosis not present

## 2013-02-10 DIAGNOSIS — N2581 Secondary hyperparathyroidism of renal origin: Secondary | ICD-10-CM | POA: Diagnosis not present

## 2013-02-10 DIAGNOSIS — Z992 Dependence on renal dialysis: Secondary | ICD-10-CM | POA: Diagnosis not present

## 2013-02-10 DIAGNOSIS — D472 Monoclonal gammopathy: Secondary | ICD-10-CM | POA: Diagnosis not present

## 2013-02-10 DIAGNOSIS — D631 Anemia in chronic kidney disease: Secondary | ICD-10-CM | POA: Diagnosis not present

## 2013-02-10 DIAGNOSIS — N186 End stage renal disease: Secondary | ICD-10-CM | POA: Diagnosis not present

## 2013-02-11 ENCOUNTER — Non-Acute Institutional Stay (SKILLED_NURSING_FACILITY): Payer: Medicare Other | Admitting: Internal Medicine

## 2013-02-11 DIAGNOSIS — I509 Heart failure, unspecified: Secondary | ICD-10-CM | POA: Diagnosis not present

## 2013-02-11 DIAGNOSIS — B20 Human immunodeficiency virus [HIV] disease: Secondary | ICD-10-CM

## 2013-02-11 DIAGNOSIS — K922 Gastrointestinal hemorrhage, unspecified: Secondary | ICD-10-CM

## 2013-02-11 DIAGNOSIS — F3289 Other specified depressive episodes: Secondary | ICD-10-CM

## 2013-02-11 DIAGNOSIS — F329 Major depressive disorder, single episode, unspecified: Secondary | ICD-10-CM

## 2013-02-11 DIAGNOSIS — I10 Essential (primary) hypertension: Secondary | ICD-10-CM

## 2013-02-11 DIAGNOSIS — I4891 Unspecified atrial fibrillation: Secondary | ICD-10-CM | POA: Diagnosis not present

## 2013-02-11 DIAGNOSIS — D649 Anemia, unspecified: Secondary | ICD-10-CM

## 2013-02-11 DIAGNOSIS — F411 Generalized anxiety disorder: Secondary | ICD-10-CM

## 2013-02-11 DIAGNOSIS — K208 Other esophagitis without bleeding: Secondary | ICD-10-CM

## 2013-02-11 DIAGNOSIS — R112 Nausea with vomiting, unspecified: Secondary | ICD-10-CM

## 2013-02-11 DIAGNOSIS — K2210 Ulcer of esophagus without bleeding: Secondary | ICD-10-CM

## 2013-02-11 DIAGNOSIS — N186 End stage renal disease: Secondary | ICD-10-CM

## 2013-02-11 DIAGNOSIS — K219 Gastro-esophageal reflux disease without esophagitis: Secondary | ICD-10-CM

## 2013-02-11 NOTE — Progress Notes (Signed)
Patient ID: [name], female   DOB: Mar 04, 1950, 62 y.o.   MRN: [id number] This is a routine visit.  Level of care skilled.  [place].   Chief complaint-medical management of end-stage renal disease-chronic pain-HIV-anemia-GERD-hypertension-history of breast mass- .  History of present illness.  Patient is a pleasant 62 year old female with the above diagnoses.  Shehas had a number of issues recently--she does have a history of end-stage renal disease on hemodialysis -at one  point this autumn she was refusing this frequently apparently however she is now going--Dr. Dellia Nims did start her on Cymbalta with suspicions possibly depression may be contributing to this.--Apparently she is going to dialysis pretty consistently-she told me when she misses one day she will go the next day    Also has a history of HIV-AIDS which is followed by infectious disease she continues on medication for this and this appears to be stable as well--- per infectious disease note in October recommendation for follow in 6 months  She also had a short hospitalization back in July for SVT-cardiac cath apparently showed fairly clean coronary arteries-she was started on diltiazem and Lopressor her Norvasc was discontinued and  this appears to be stable.  .  She recently developed a right breast mass this has been biopsied--and felt to be fibrocystic changes.  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  Patient also was hospitalized shortly back in October 2013 after falling in the facility and sustaining a small intracranial bleed-this appears to have resolved unremarkably they just hospitalized her as a precautionary measure.  She does have a history of anemia possible GI bleed but she has essentially deferred any workup for this.  Patient apparently does have occasional episodes of nausea and vomiting-infectious disease did start her on Zofran when necessary for this.--Appears  she takes this about once a day  Apparently she had an episode earlier today.--She denies any chest pain or abdominal pain with this-.    .  Previous medical history as been reviewed per history and physical on 12/27/2012.   Medications have been reviewed per MAR.   Review of systems.  In general denies any fever or chills her weight has been stable.  Skin-denies recent issues itching or rash.  Head-ears nose mouth and throat-does not complaining of any sore throat or dysphagia at this point or nasal discharge or allergy symptoms.  Respiratory no complaints of shortness of breath or cough.  Cardiac-does not complaining of chest pain or palpitations.  GI-does have some history of GERD --also occasional nausea and vomiting as noted above.  GU end-stage renal disease history but does not complaining of dysuria.  Muscle skeletal ambulates in a wheelchair  At times-- does not complaining of joint pain does have a history of chronic pain here is on oxycodone routine as well as when necessary and this appears to help.  Neurologic-no complaints of headache or dizziness recent intracranial bleed but this appears to have resolved and is asymptomatic.  Psych-does complain of anxiety at timeson Ativan prn--takesit appears about every other day --has been started on Cymbalta for depression-according to staff she does not ambulate in the hall and her wheelchair as much these days-she concurred with this-I did encourage her to get out and socialize a bit more which has been her baseline earlier this year- .  Physical exam.  She is afebrile pulse 78 respirations 20 blood pressure 110/62 weight is stable at 112  In general this is a  somewhat frail middle-aged female in no distress lying comfortably in bed.  Her skin is warm and dry.  Breasts-there is an area of firmness on the lower aspect of the right breast this does not appear to be warm or acutely tender--this is baseline with previous exam.  Left  breast appears to be within normal limits  She does have a shunt on the right lower arm bruit is appreciated.  Eyes pupils appear equal round react to light sclera and conjunctiva are clear visual acuity appears intact.  Oropharynx is clear mucous membranes moist.  Heart is regular rate and rhythm without murmur gallop or rub she has an occasional irregular beat.  Chest is clear to auscultation without rhonchi rales or wheezes.  Abdomen somewhat protuberant soft nontender with active bowel sounds.  Extremities she does ambulate in a wheelchair  At times he did not note any deformities she does not have significant lower extremity edema.--  Neurologic is grossly intact her speech is clear.  Psych she is alert and oriented x3 pleasant and appropriate--appears to be in decent spirits today it is her birthday--and her daughter actually called while we were in the room  .  Labs  10/28/2012.  WBC 3.1 hemoglobin 10.0 platelets 116.  Cholesterol 119 triglycerides 53 HDL 44 LDL 64.   .  05/21/2012.  Sodium 135 potassium 4.7 BUN 54 creatinine 12.48.  Liver function tests within normal limits.  3-- 13 2014.  WBC 2.8 hemoglobin 9.8 platelets 168.  04/12/2012.  TSH-3.29.   Assessment and plan.  #1-end-stage renal disease on chronic hemodialysis-t apparently she is now going to dialysis consistently-I did discuss this with her she says if she does not make it 1 day--she goes the next day   #2-history of right breast mass-this apparently has been determined to be fibrocystic-monitor accordingly this has been followed again by surgery.  #3-history of hypertension-atrial fibrillation-this appears rate controlled she is on Cardizem and Lopressor-blood pressure appears well controlled-as well as heart rate.  #4-history of chronic pain-currently has Duragesic  patch as well as OxyIR each bedtime and every 6 hours when necessary also has Roxicodone routine and when necessary this appears controlled at  present she is followed by pain management doctor as well.  #5-history of HIV-AIDS-is followed by infectious disease and continues on therapy-this has been stable for some time--they have noted recent CD4 count is down-followup appointment in 4 months.  #6-anemia-apparently some history of GI bleed here that she does not want worked up-will try to obtain labs from dialysis for updating including a CBC.--She does continue on Procrit  #7-history of GERD --erosive gastritis-Mallory-Weiss tear --she is on protonix. this appears to be helping.  #8-history of thyroid nodule again this was worked up and thought to be follicular followup as needed.  #9-history of intracranial bleed-again this was status post fall last autumn-this has not been an issue since her return neurologic she she appears to be intact.  #10-history of angioedema and again she was hospitalized at one point for increased swelling there have been no further episodes of this--I believe this was thought to be secondary to an ACE inhibitor which was discontinued.  #11-history colitis past again she did require hospitalization for this this was treated with vancomycin and this appears to have been asymptomatic since her hospitalization she is not on any antidiarrheals which was recommended status post hospitalization  #12-CHF-per chart review I see a listed history of this but I do not see any evidence of this  t continue to monitor this has been stable for some time. #13-depression that she has been started on Cymbalta this appears to be having somewhat of a positive effec-she is now goingdialysis apparently somewhat consistently-however she states she still has crying episodes at times-I did propose possibility of a psychiatric consult however at this point she would prefer not to follow through on this--she denies any intention to harm herself t.  #14 nausea and vomiting-apparently this is intermittent it has been going on for several  months--she does have Zofran when necessary--at this point would like to obtain lab work that could be drawn at dialysis on Monday if possible including a CBC CMP amylase and lipase-also consider further testing x-ray-question ultrasound at some point in the near future-for now will order lab work and monitor this apparently has not gotten any worse the past several months   [id number]-of note greater than 35 minutes spent assessing patient-reviewing her extensive medical history-and formulating coordinating plan of care for numerous diagnoses

## 2013-02-15 ENCOUNTER — Non-Acute Institutional Stay (SKILLED_NURSING_FACILITY): Payer: Medicare Other | Admitting: Internal Medicine

## 2013-02-15 DIAGNOSIS — K2210 Ulcer of esophagus without bleeding: Secondary | ICD-10-CM

## 2013-02-15 DIAGNOSIS — N186 End stage renal disease: Secondary | ICD-10-CM | POA: Diagnosis not present

## 2013-02-15 DIAGNOSIS — K219 Gastro-esophageal reflux disease without esophagitis: Secondary | ICD-10-CM

## 2013-02-15 DIAGNOSIS — D472 Monoclonal gammopathy: Secondary | ICD-10-CM | POA: Diagnosis not present

## 2013-02-15 DIAGNOSIS — R110 Nausea: Secondary | ICD-10-CM | POA: Diagnosis not present

## 2013-02-15 DIAGNOSIS — K208 Other esophagitis without bleeding: Secondary | ICD-10-CM

## 2013-02-15 DIAGNOSIS — Z992 Dependence on renal dialysis: Secondary | ICD-10-CM | POA: Diagnosis not present

## 2013-02-15 DIAGNOSIS — D631 Anemia in chronic kidney disease: Secondary | ICD-10-CM | POA: Diagnosis not present

## 2013-02-15 DIAGNOSIS — N2581 Secondary hyperparathyroidism of renal origin: Secondary | ICD-10-CM | POA: Diagnosis not present

## 2013-02-15 NOTE — Progress Notes (Signed)
Patient ID: [name], female   DOB: January 13, 1951, 62 y.o.   MRN: [id number]  This is an acute visit.  Level of care skilled.  Facility [facility].   Chief complaint- Acute visit followup nausea .   History of present illness.   Patient is a pleasant 62 year old female with the above diagnoses.   Shehas had a number of issues recently--she does have a history of end-stage renal disease on hemodialysis -at one  point this autumn she was refusing this frequently apparently however she is now going-   .   Patient apparently does have occasional episodes of nausea and vomiting-infectious disease did start her on Zofran when necessary for this.--Appears she takes this about once a day--mostly in the mornings She staff has left a note saying that she refused dialysis a couple days ago secondary to nausea  She does have a listed history of GERD and e gastritis and is on protonic twice a day Talking to one of her nurses today-apparently this nausea in the morning is fairly frequent in fact they have left a note asking if the Zofran to be made routine in the a.m.  When I saw her last week wrote an order to obtain lab work including a CMP and amylase and lipase that can be drawn at dialysis-however this was not drawn on Monday obviously since she did not go to dialysis--apparently talking to patient this evening it was drawn today  She did go to dialysis today   Currently she is resting comfortably in bed always feels a bit tired after her dialysis session-she states this nausea has been persisting now for several months-occasionally her stomach will hurt but this is not consistent.  She is not really complaining of nausea presently  but-says she did not eat much for supper     .       Marland Kitchen   Previous medical history as been reviewed per history and physical on 12/27/2012.    Medications have been reviewed per MAR.    Review of systems.   In general denies any fever or chills her  weight has been stable.   Skin-denies recent issues itching or rash.   Head-ears nose mouth and throat-does not complaining of any sore throat or dysphagia at this point or nasal discharge or allergy symptoms.   Respiratory no complaints of shortness of breath or cough.   Cardiac-does not complaining of chest pain or palpitations.   GI-does have some history of GERD -nausea and vomiting as noted above.--Occasional stomach discomfort but not persistent   GU end-stage renal disease history but does not complaining of dysuria.    .     .   Physical exam.   Temperature 98.3 pulse 62 respirations 20 blood pressure 110/68  In general this is a somewhat frail middle-aged female in no distress lying comfortably in bed.   Her skin is warm and dry.    .   Oropharynx is clear mucous membranes moist.   Heart is regular rate and rhythm without murmur gallop or rub .   Chest is clear to auscultation without rhonchi rales or wheezes.   Abdomen somewhat protuberant soft nontender with active bowel sounds.       .   Labs     10/28/2012.   WBC 3.1 hemoglobin 10.0 platelets 116.   Cholesterol 119 triglycerides 53 HDL 44 LDL 64.     .   05/21/2012.   Sodium 135 potassium 4.7 BUN 54 creatinine 12.48.  Liver function tests within normal limits.   3-- 13 2014.   WBC 2.8 hemoglobin 9.8 platelets 168.   04/12/2012.   TSH-3.29.    Assessment and plan.      #nausea -apparently this has been fairly persistent over the past several months somewhat the patient and staff tell me today--t this is mainly in the a.m.-I did discuss this with Dr. Dellia Nims via phone at this point would be hesitant to prescribe Zofran routinely will continue when necessary Also lab work apparently has been obtained today including a CBC CMP amylase and lipase-would like to see what those results tell us-. He continues on Protonix as well with a history of gastritis as well as GERD  She is not complaining of any  acute abdominal discomfort-but pending lab work would consider possibly x-ray or ultrasound-again is important to get the lab work here for follow up--  BY:[id number]

## 2013-02-17 DIAGNOSIS — D472 Monoclonal gammopathy: Secondary | ICD-10-CM | POA: Diagnosis not present

## 2013-02-17 DIAGNOSIS — Z992 Dependence on renal dialysis: Secondary | ICD-10-CM | POA: Diagnosis not present

## 2013-02-17 DIAGNOSIS — N186 End stage renal disease: Secondary | ICD-10-CM | POA: Diagnosis not present

## 2013-02-17 DIAGNOSIS — D631 Anemia in chronic kidney disease: Secondary | ICD-10-CM | POA: Diagnosis not present

## 2013-02-17 DIAGNOSIS — N2581 Secondary hyperparathyroidism of renal origin: Secondary | ICD-10-CM | POA: Diagnosis not present

## 2013-02-19 DIAGNOSIS — Z79899 Other long term (current) drug therapy: Secondary | ICD-10-CM | POA: Diagnosis not present

## 2013-02-19 DIAGNOSIS — N2581 Secondary hyperparathyroidism of renal origin: Secondary | ICD-10-CM | POA: Diagnosis not present

## 2013-02-19 DIAGNOSIS — D631 Anemia in chronic kidney disease: Secondary | ICD-10-CM | POA: Diagnosis not present

## 2013-02-19 DIAGNOSIS — F321 Major depressive disorder, single episode, moderate: Secondary | ICD-10-CM | POA: Diagnosis not present

## 2013-02-19 DIAGNOSIS — F411 Generalized anxiety disorder: Secondary | ICD-10-CM | POA: Diagnosis not present

## 2013-02-19 DIAGNOSIS — Z992 Dependence on renal dialysis: Secondary | ICD-10-CM | POA: Diagnosis not present

## 2013-02-19 DIAGNOSIS — N186 End stage renal disease: Secondary | ICD-10-CM | POA: Diagnosis not present

## 2013-02-19 DIAGNOSIS — D472 Monoclonal gammopathy: Secondary | ICD-10-CM | POA: Diagnosis not present

## 2013-02-21 DIAGNOSIS — D631 Anemia in chronic kidney disease: Secondary | ICD-10-CM | POA: Diagnosis not present

## 2013-02-21 DIAGNOSIS — N2581 Secondary hyperparathyroidism of renal origin: Secondary | ICD-10-CM | POA: Diagnosis not present

## 2013-02-21 DIAGNOSIS — Z992 Dependence on renal dialysis: Secondary | ICD-10-CM | POA: Diagnosis not present

## 2013-02-21 DIAGNOSIS — D472 Monoclonal gammopathy: Secondary | ICD-10-CM | POA: Diagnosis not present

## 2013-02-21 DIAGNOSIS — N186 End stage renal disease: Secondary | ICD-10-CM | POA: Diagnosis not present

## 2013-02-24 DIAGNOSIS — N2581 Secondary hyperparathyroidism of renal origin: Secondary | ICD-10-CM | POA: Diagnosis not present

## 2013-02-24 DIAGNOSIS — Z992 Dependence on renal dialysis: Secondary | ICD-10-CM | POA: Diagnosis not present

## 2013-02-24 DIAGNOSIS — D472 Monoclonal gammopathy: Secondary | ICD-10-CM | POA: Diagnosis not present

## 2013-02-24 DIAGNOSIS — D631 Anemia in chronic kidney disease: Secondary | ICD-10-CM | POA: Diagnosis not present

## 2013-02-24 DIAGNOSIS — N186 End stage renal disease: Secondary | ICD-10-CM | POA: Diagnosis not present

## 2013-02-27 DIAGNOSIS — Z992 Dependence on renal dialysis: Secondary | ICD-10-CM | POA: Diagnosis not present

## 2013-02-27 DIAGNOSIS — N2581 Secondary hyperparathyroidism of renal origin: Secondary | ICD-10-CM | POA: Diagnosis not present

## 2013-02-27 DIAGNOSIS — D472 Monoclonal gammopathy: Secondary | ICD-10-CM | POA: Diagnosis not present

## 2013-02-27 DIAGNOSIS — D631 Anemia in chronic kidney disease: Secondary | ICD-10-CM | POA: Diagnosis not present

## 2013-02-27 DIAGNOSIS — N186 End stage renal disease: Secondary | ICD-10-CM | POA: Diagnosis not present

## 2013-03-01 DIAGNOSIS — N186 End stage renal disease: Secondary | ICD-10-CM | POA: Diagnosis not present

## 2013-03-03 DIAGNOSIS — N2581 Secondary hyperparathyroidism of renal origin: Secondary | ICD-10-CM | POA: Diagnosis not present

## 2013-03-03 DIAGNOSIS — R110 Nausea: Secondary | ICD-10-CM | POA: Diagnosis not present

## 2013-03-03 DIAGNOSIS — D509 Iron deficiency anemia, unspecified: Secondary | ICD-10-CM | POA: Diagnosis not present

## 2013-03-03 DIAGNOSIS — D631 Anemia in chronic kidney disease: Secondary | ICD-10-CM | POA: Diagnosis not present

## 2013-03-03 DIAGNOSIS — R1110 Vomiting, unspecified: Secondary | ICD-10-CM | POA: Diagnosis not present

## 2013-03-03 DIAGNOSIS — N186 End stage renal disease: Secondary | ICD-10-CM | POA: Diagnosis not present

## 2013-03-03 DIAGNOSIS — D472 Monoclonal gammopathy: Secondary | ICD-10-CM | POA: Diagnosis not present

## 2013-03-08 DIAGNOSIS — N186 End stage renal disease: Secondary | ICD-10-CM | POA: Diagnosis not present

## 2013-03-09 DIAGNOSIS — G894 Chronic pain syndrome: Secondary | ICD-10-CM | POA: Diagnosis not present

## 2013-03-26 DIAGNOSIS — F321 Major depressive disorder, single episode, moderate: Secondary | ICD-10-CM | POA: Diagnosis not present

## 2013-03-26 DIAGNOSIS — F411 Generalized anxiety disorder: Secondary | ICD-10-CM | POA: Diagnosis not present

## 2013-04-01 DIAGNOSIS — N186 End stage renal disease: Secondary | ICD-10-CM | POA: Diagnosis not present

## 2013-04-03 DIAGNOSIS — R110 Nausea: Secondary | ICD-10-CM | POA: Diagnosis not present

## 2013-04-03 DIAGNOSIS — N2581 Secondary hyperparathyroidism of renal origin: Secondary | ICD-10-CM | POA: Diagnosis not present

## 2013-04-03 DIAGNOSIS — N186 End stage renal disease: Secondary | ICD-10-CM | POA: Diagnosis not present

## 2013-04-03 DIAGNOSIS — D509 Iron deficiency anemia, unspecified: Secondary | ICD-10-CM | POA: Diagnosis not present

## 2013-04-03 DIAGNOSIS — D472 Monoclonal gammopathy: Secondary | ICD-10-CM | POA: Diagnosis not present

## 2013-04-03 DIAGNOSIS — D631 Anemia in chronic kidney disease: Secondary | ICD-10-CM | POA: Diagnosis not present

## 2013-04-05 DIAGNOSIS — N186 End stage renal disease: Secondary | ICD-10-CM | POA: Diagnosis not present

## 2013-04-05 DIAGNOSIS — D509 Iron deficiency anemia, unspecified: Secondary | ICD-10-CM | POA: Diagnosis not present

## 2013-04-05 DIAGNOSIS — D472 Monoclonal gammopathy: Secondary | ICD-10-CM | POA: Diagnosis not present

## 2013-04-05 DIAGNOSIS — N2581 Secondary hyperparathyroidism of renal origin: Secondary | ICD-10-CM | POA: Diagnosis not present

## 2013-04-05 DIAGNOSIS — R110 Nausea: Secondary | ICD-10-CM | POA: Diagnosis not present

## 2013-04-05 DIAGNOSIS — D631 Anemia in chronic kidney disease: Secondary | ICD-10-CM | POA: Diagnosis not present

## 2013-04-06 DIAGNOSIS — G894 Chronic pain syndrome: Secondary | ICD-10-CM | POA: Diagnosis not present

## 2013-04-07 DIAGNOSIS — D509 Iron deficiency anemia, unspecified: Secondary | ICD-10-CM | POA: Diagnosis not present

## 2013-04-07 DIAGNOSIS — R110 Nausea: Secondary | ICD-10-CM | POA: Diagnosis not present

## 2013-04-07 DIAGNOSIS — N039 Chronic nephritic syndrome with unspecified morphologic changes: Secondary | ICD-10-CM | POA: Diagnosis not present

## 2013-04-07 DIAGNOSIS — N186 End stage renal disease: Secondary | ICD-10-CM | POA: Diagnosis not present

## 2013-04-07 DIAGNOSIS — N2581 Secondary hyperparathyroidism of renal origin: Secondary | ICD-10-CM | POA: Diagnosis not present

## 2013-04-07 DIAGNOSIS — D631 Anemia in chronic kidney disease: Secondary | ICD-10-CM | POA: Diagnosis not present

## 2013-04-07 DIAGNOSIS — D472 Monoclonal gammopathy: Secondary | ICD-10-CM | POA: Diagnosis not present

## 2013-04-10 DIAGNOSIS — D509 Iron deficiency anemia, unspecified: Secondary | ICD-10-CM | POA: Diagnosis not present

## 2013-04-10 DIAGNOSIS — N2581 Secondary hyperparathyroidism of renal origin: Secondary | ICD-10-CM | POA: Diagnosis not present

## 2013-04-10 DIAGNOSIS — R110 Nausea: Secondary | ICD-10-CM | POA: Diagnosis not present

## 2013-04-10 DIAGNOSIS — D472 Monoclonal gammopathy: Secondary | ICD-10-CM | POA: Diagnosis not present

## 2013-04-10 DIAGNOSIS — D631 Anemia in chronic kidney disease: Secondary | ICD-10-CM | POA: Diagnosis not present

## 2013-04-10 DIAGNOSIS — N186 End stage renal disease: Secondary | ICD-10-CM | POA: Diagnosis not present

## 2013-04-12 DIAGNOSIS — D472 Monoclonal gammopathy: Secondary | ICD-10-CM | POA: Diagnosis not present

## 2013-04-12 DIAGNOSIS — D631 Anemia in chronic kidney disease: Secondary | ICD-10-CM | POA: Diagnosis not present

## 2013-04-12 DIAGNOSIS — R7309 Other abnormal glucose: Secondary | ICD-10-CM | POA: Diagnosis not present

## 2013-04-12 DIAGNOSIS — R110 Nausea: Secondary | ICD-10-CM | POA: Diagnosis not present

## 2013-04-12 DIAGNOSIS — D509 Iron deficiency anemia, unspecified: Secondary | ICD-10-CM | POA: Diagnosis not present

## 2013-04-12 DIAGNOSIS — N186 End stage renal disease: Secondary | ICD-10-CM | POA: Diagnosis not present

## 2013-04-12 DIAGNOSIS — N2581 Secondary hyperparathyroidism of renal origin: Secondary | ICD-10-CM | POA: Diagnosis not present

## 2013-04-14 DIAGNOSIS — D631 Anemia in chronic kidney disease: Secondary | ICD-10-CM | POA: Diagnosis not present

## 2013-04-14 DIAGNOSIS — N2581 Secondary hyperparathyroidism of renal origin: Secondary | ICD-10-CM | POA: Diagnosis not present

## 2013-04-14 DIAGNOSIS — N186 End stage renal disease: Secondary | ICD-10-CM | POA: Diagnosis not present

## 2013-04-14 DIAGNOSIS — R110 Nausea: Secondary | ICD-10-CM | POA: Diagnosis not present

## 2013-04-14 DIAGNOSIS — D472 Monoclonal gammopathy: Secondary | ICD-10-CM | POA: Diagnosis not present

## 2013-04-14 DIAGNOSIS — D509 Iron deficiency anemia, unspecified: Secondary | ICD-10-CM | POA: Diagnosis not present

## 2013-04-17 DIAGNOSIS — R110 Nausea: Secondary | ICD-10-CM | POA: Diagnosis not present

## 2013-04-17 DIAGNOSIS — N186 End stage renal disease: Secondary | ICD-10-CM | POA: Diagnosis not present

## 2013-04-17 DIAGNOSIS — D631 Anemia in chronic kidney disease: Secondary | ICD-10-CM | POA: Diagnosis not present

## 2013-04-17 DIAGNOSIS — D472 Monoclonal gammopathy: Secondary | ICD-10-CM | POA: Diagnosis not present

## 2013-04-17 DIAGNOSIS — N2581 Secondary hyperparathyroidism of renal origin: Secondary | ICD-10-CM | POA: Diagnosis not present

## 2013-04-17 DIAGNOSIS — D509 Iron deficiency anemia, unspecified: Secondary | ICD-10-CM | POA: Diagnosis not present

## 2013-04-19 DIAGNOSIS — R110 Nausea: Secondary | ICD-10-CM | POA: Diagnosis not present

## 2013-04-19 DIAGNOSIS — N186 End stage renal disease: Secondary | ICD-10-CM | POA: Diagnosis not present

## 2013-04-19 DIAGNOSIS — D472 Monoclonal gammopathy: Secondary | ICD-10-CM | POA: Diagnosis not present

## 2013-04-19 DIAGNOSIS — D509 Iron deficiency anemia, unspecified: Secondary | ICD-10-CM | POA: Diagnosis not present

## 2013-04-19 DIAGNOSIS — N039 Chronic nephritic syndrome with unspecified morphologic changes: Secondary | ICD-10-CM | POA: Diagnosis not present

## 2013-04-19 DIAGNOSIS — D631 Anemia in chronic kidney disease: Secondary | ICD-10-CM | POA: Diagnosis not present

## 2013-04-19 DIAGNOSIS — N2581 Secondary hyperparathyroidism of renal origin: Secondary | ICD-10-CM | POA: Diagnosis not present

## 2013-04-24 DIAGNOSIS — D472 Monoclonal gammopathy: Secondary | ICD-10-CM | POA: Diagnosis not present

## 2013-04-24 DIAGNOSIS — N186 End stage renal disease: Secondary | ICD-10-CM | POA: Diagnosis not present

## 2013-04-24 DIAGNOSIS — D631 Anemia in chronic kidney disease: Secondary | ICD-10-CM | POA: Diagnosis not present

## 2013-04-24 DIAGNOSIS — N2581 Secondary hyperparathyroidism of renal origin: Secondary | ICD-10-CM | POA: Diagnosis not present

## 2013-04-24 DIAGNOSIS — R110 Nausea: Secondary | ICD-10-CM | POA: Diagnosis not present

## 2013-04-24 DIAGNOSIS — D509 Iron deficiency anemia, unspecified: Secondary | ICD-10-CM | POA: Diagnosis not present

## 2013-04-29 DIAGNOSIS — N186 End stage renal disease: Secondary | ICD-10-CM | POA: Diagnosis not present

## 2013-05-01 DIAGNOSIS — N186 End stage renal disease: Secondary | ICD-10-CM | POA: Diagnosis not present

## 2013-05-01 DIAGNOSIS — N2581 Secondary hyperparathyroidism of renal origin: Secondary | ICD-10-CM | POA: Diagnosis not present

## 2013-05-01 DIAGNOSIS — D631 Anemia in chronic kidney disease: Secondary | ICD-10-CM | POA: Diagnosis not present

## 2013-05-01 DIAGNOSIS — D509 Iron deficiency anemia, unspecified: Secondary | ICD-10-CM | POA: Diagnosis not present

## 2013-05-01 DIAGNOSIS — D472 Monoclonal gammopathy: Secondary | ICD-10-CM | POA: Diagnosis not present

## 2013-05-11 DIAGNOSIS — G894 Chronic pain syndrome: Secondary | ICD-10-CM | POA: Diagnosis not present

## 2013-05-28 DIAGNOSIS — F321 Major depressive disorder, single episode, moderate: Secondary | ICD-10-CM | POA: Diagnosis not present

## 2013-05-28 DIAGNOSIS — F411 Generalized anxiety disorder: Secondary | ICD-10-CM | POA: Diagnosis not present

## 2013-05-29 ENCOUNTER — Other Ambulatory Visit: Payer: Self-pay | Admitting: *Deleted

## 2013-05-29 MED ORDER — ZOLPIDEM TARTRATE 5 MG PO TABS: ORAL_TABLET | ORAL | Status: DC

## 2013-05-30 DIAGNOSIS — N186 End stage renal disease: Secondary | ICD-10-CM | POA: Diagnosis not present

## 2013-05-31 DIAGNOSIS — D472 Monoclonal gammopathy: Secondary | ICD-10-CM | POA: Diagnosis not present

## 2013-05-31 DIAGNOSIS — N186 End stage renal disease: Secondary | ICD-10-CM | POA: Diagnosis not present

## 2013-05-31 DIAGNOSIS — D509 Iron deficiency anemia, unspecified: Secondary | ICD-10-CM | POA: Diagnosis not present

## 2013-05-31 DIAGNOSIS — D631 Anemia in chronic kidney disease: Secondary | ICD-10-CM | POA: Diagnosis not present

## 2013-05-31 DIAGNOSIS — N2581 Secondary hyperparathyroidism of renal origin: Secondary | ICD-10-CM | POA: Diagnosis not present

## 2013-05-31 DIAGNOSIS — N039 Chronic nephritic syndrome with unspecified morphologic changes: Secondary | ICD-10-CM | POA: Diagnosis not present

## 2013-06-02 DIAGNOSIS — N186 End stage renal disease: Secondary | ICD-10-CM | POA: Diagnosis not present

## 2013-06-02 DIAGNOSIS — D472 Monoclonal gammopathy: Secondary | ICD-10-CM | POA: Diagnosis not present

## 2013-06-02 DIAGNOSIS — D509 Iron deficiency anemia, unspecified: Secondary | ICD-10-CM | POA: Diagnosis not present

## 2013-06-02 DIAGNOSIS — N2581 Secondary hyperparathyroidism of renal origin: Secondary | ICD-10-CM | POA: Diagnosis not present

## 2013-06-02 DIAGNOSIS — D631 Anemia in chronic kidney disease: Secondary | ICD-10-CM | POA: Diagnosis not present

## 2013-06-05 DIAGNOSIS — D509 Iron deficiency anemia, unspecified: Secondary | ICD-10-CM | POA: Diagnosis not present

## 2013-06-05 DIAGNOSIS — D631 Anemia in chronic kidney disease: Secondary | ICD-10-CM | POA: Diagnosis not present

## 2013-06-05 DIAGNOSIS — N2581 Secondary hyperparathyroidism of renal origin: Secondary | ICD-10-CM | POA: Diagnosis not present

## 2013-06-05 DIAGNOSIS — D472 Monoclonal gammopathy: Secondary | ICD-10-CM | POA: Diagnosis not present

## 2013-06-05 DIAGNOSIS — N186 End stage renal disease: Secondary | ICD-10-CM | POA: Diagnosis not present

## 2013-06-07 DIAGNOSIS — B20 Human immunodeficiency virus [HIV] disease: Secondary | ICD-10-CM | POA: Diagnosis not present

## 2013-06-07 DIAGNOSIS — N2581 Secondary hyperparathyroidism of renal origin: Secondary | ICD-10-CM | POA: Diagnosis not present

## 2013-06-07 DIAGNOSIS — N039 Chronic nephritic syndrome with unspecified morphologic changes: Secondary | ICD-10-CM | POA: Diagnosis not present

## 2013-06-07 DIAGNOSIS — E782 Mixed hyperlipidemia: Secondary | ICD-10-CM | POA: Diagnosis not present

## 2013-06-07 DIAGNOSIS — D649 Anemia, unspecified: Secondary | ICD-10-CM | POA: Diagnosis not present

## 2013-06-07 DIAGNOSIS — D472 Monoclonal gammopathy: Secondary | ICD-10-CM | POA: Diagnosis not present

## 2013-06-07 DIAGNOSIS — N186 End stage renal disease: Secondary | ICD-10-CM | POA: Diagnosis not present

## 2013-06-07 DIAGNOSIS — D509 Iron deficiency anemia, unspecified: Secondary | ICD-10-CM | POA: Diagnosis not present

## 2013-06-07 DIAGNOSIS — Z79899 Other long term (current) drug therapy: Secondary | ICD-10-CM | POA: Diagnosis not present

## 2013-06-07 DIAGNOSIS — D631 Anemia in chronic kidney disease: Secondary | ICD-10-CM | POA: Diagnosis not present

## 2013-06-12 ENCOUNTER — Other Ambulatory Visit: Payer: Self-pay | Admitting: *Deleted

## 2013-06-12 DIAGNOSIS — N039 Chronic nephritic syndrome with unspecified morphologic changes: Secondary | ICD-10-CM | POA: Diagnosis not present

## 2013-06-12 DIAGNOSIS — D631 Anemia in chronic kidney disease: Secondary | ICD-10-CM | POA: Diagnosis not present

## 2013-06-12 DIAGNOSIS — N186 End stage renal disease: Secondary | ICD-10-CM | POA: Diagnosis not present

## 2013-06-12 DIAGNOSIS — D472 Monoclonal gammopathy: Secondary | ICD-10-CM | POA: Diagnosis not present

## 2013-06-12 DIAGNOSIS — D509 Iron deficiency anemia, unspecified: Secondary | ICD-10-CM | POA: Diagnosis not present

## 2013-06-12 DIAGNOSIS — N2581 Secondary hyperparathyroidism of renal origin: Secondary | ICD-10-CM | POA: Diagnosis not present

## 2013-06-12 MED ORDER — FENTANYL 12 MCG/HR TD PT72: MEDICATED_PATCH | TRANSDERMAL | Status: DC

## 2013-06-12 MED ORDER — FENTANYL 25 MCG/HR TD PT72: MEDICATED_PATCH | TRANSDERMAL | Status: DC

## 2013-06-12 NOTE — Telephone Encounter (Signed)
[facility] 

## 2013-06-13 ENCOUNTER — Ambulatory Visit: Payer: Medicare Other | Admitting: Infectious Diseases

## 2013-06-14 DIAGNOSIS — D631 Anemia in chronic kidney disease: Secondary | ICD-10-CM | POA: Diagnosis not present

## 2013-06-14 DIAGNOSIS — N2581 Secondary hyperparathyroidism of renal origin: Secondary | ICD-10-CM | POA: Diagnosis not present

## 2013-06-14 DIAGNOSIS — N039 Chronic nephritic syndrome with unspecified morphologic changes: Secondary | ICD-10-CM | POA: Diagnosis not present

## 2013-06-14 DIAGNOSIS — N186 End stage renal disease: Secondary | ICD-10-CM | POA: Diagnosis not present

## 2013-06-14 DIAGNOSIS — D472 Monoclonal gammopathy: Secondary | ICD-10-CM | POA: Diagnosis not present

## 2013-06-14 DIAGNOSIS — D509 Iron deficiency anemia, unspecified: Secondary | ICD-10-CM | POA: Diagnosis not present

## 2013-06-15 ENCOUNTER — Ambulatory Visit: Payer: Medicare Other | Admitting: Infectious Diseases

## 2013-06-15 DIAGNOSIS — G894 Chronic pain syndrome: Secondary | ICD-10-CM | POA: Diagnosis not present

## 2013-06-16 DIAGNOSIS — D472 Monoclonal gammopathy: Secondary | ICD-10-CM | POA: Diagnosis not present

## 2013-06-16 DIAGNOSIS — D509 Iron deficiency anemia, unspecified: Secondary | ICD-10-CM | POA: Diagnosis not present

## 2013-06-16 DIAGNOSIS — D631 Anemia in chronic kidney disease: Secondary | ICD-10-CM | POA: Diagnosis not present

## 2013-06-16 DIAGNOSIS — N2581 Secondary hyperparathyroidism of renal origin: Secondary | ICD-10-CM | POA: Diagnosis not present

## 2013-06-16 DIAGNOSIS — N186 End stage renal disease: Secondary | ICD-10-CM | POA: Diagnosis not present

## 2013-06-16 DIAGNOSIS — N039 Chronic nephritic syndrome with unspecified morphologic changes: Secondary | ICD-10-CM | POA: Diagnosis not present

## 2013-06-19 DIAGNOSIS — M6281 Muscle weakness (generalized): Secondary | ICD-10-CM | POA: Diagnosis not present

## 2013-06-19 DIAGNOSIS — R279 Unspecified lack of coordination: Secondary | ICD-10-CM | POA: Diagnosis not present

## 2013-06-19 DIAGNOSIS — I471 Supraventricular tachycardia: Secondary | ICD-10-CM | POA: Diagnosis not present

## 2013-06-20 DIAGNOSIS — R279 Unspecified lack of coordination: Secondary | ICD-10-CM | POA: Diagnosis not present

## 2013-06-20 DIAGNOSIS — I471 Supraventricular tachycardia: Secondary | ICD-10-CM | POA: Diagnosis not present

## 2013-06-20 DIAGNOSIS — M6281 Muscle weakness (generalized): Secondary | ICD-10-CM | POA: Diagnosis not present

## 2013-06-21 DIAGNOSIS — D472 Monoclonal gammopathy: Secondary | ICD-10-CM | POA: Diagnosis not present

## 2013-06-21 DIAGNOSIS — D631 Anemia in chronic kidney disease: Secondary | ICD-10-CM | POA: Diagnosis not present

## 2013-06-21 DIAGNOSIS — N186 End stage renal disease: Secondary | ICD-10-CM | POA: Diagnosis not present

## 2013-06-21 DIAGNOSIS — D509 Iron deficiency anemia, unspecified: Secondary | ICD-10-CM | POA: Diagnosis not present

## 2013-06-21 DIAGNOSIS — N2581 Secondary hyperparathyroidism of renal origin: Secondary | ICD-10-CM | POA: Diagnosis not present

## 2013-06-23 DIAGNOSIS — D509 Iron deficiency anemia, unspecified: Secondary | ICD-10-CM | POA: Diagnosis not present

## 2013-06-23 DIAGNOSIS — R279 Unspecified lack of coordination: Secondary | ICD-10-CM | POA: Diagnosis not present

## 2013-06-23 DIAGNOSIS — I471 Supraventricular tachycardia: Secondary | ICD-10-CM | POA: Diagnosis not present

## 2013-06-23 DIAGNOSIS — D472 Monoclonal gammopathy: Secondary | ICD-10-CM | POA: Diagnosis not present

## 2013-06-23 DIAGNOSIS — D631 Anemia in chronic kidney disease: Secondary | ICD-10-CM | POA: Diagnosis not present

## 2013-06-23 DIAGNOSIS — N186 End stage renal disease: Secondary | ICD-10-CM | POA: Diagnosis not present

## 2013-06-23 DIAGNOSIS — N2581 Secondary hyperparathyroidism of renal origin: Secondary | ICD-10-CM | POA: Diagnosis not present

## 2013-06-23 DIAGNOSIS — M6281 Muscle weakness (generalized): Secondary | ICD-10-CM | POA: Diagnosis not present

## 2013-06-25 DIAGNOSIS — F331 Major depressive disorder, recurrent, moderate: Secondary | ICD-10-CM | POA: Diagnosis not present

## 2013-06-25 DIAGNOSIS — F411 Generalized anxiety disorder: Secondary | ICD-10-CM | POA: Diagnosis not present

## 2013-06-26 DIAGNOSIS — D631 Anemia in chronic kidney disease: Secondary | ICD-10-CM | POA: Diagnosis not present

## 2013-06-26 DIAGNOSIS — N039 Chronic nephritic syndrome with unspecified morphologic changes: Secondary | ICD-10-CM | POA: Diagnosis not present

## 2013-06-26 DIAGNOSIS — D509 Iron deficiency anemia, unspecified: Secondary | ICD-10-CM | POA: Diagnosis not present

## 2013-06-26 DIAGNOSIS — D472 Monoclonal gammopathy: Secondary | ICD-10-CM | POA: Diagnosis not present

## 2013-06-26 DIAGNOSIS — N2581 Secondary hyperparathyroidism of renal origin: Secondary | ICD-10-CM | POA: Diagnosis not present

## 2013-06-26 DIAGNOSIS — N186 End stage renal disease: Secondary | ICD-10-CM | POA: Diagnosis not present

## 2013-06-28 DIAGNOSIS — D631 Anemia in chronic kidney disease: Secondary | ICD-10-CM | POA: Diagnosis not present

## 2013-06-28 DIAGNOSIS — N2581 Secondary hyperparathyroidism of renal origin: Secondary | ICD-10-CM | POA: Diagnosis not present

## 2013-06-28 DIAGNOSIS — N186 End stage renal disease: Secondary | ICD-10-CM | POA: Diagnosis not present

## 2013-06-28 DIAGNOSIS — D509 Iron deficiency anemia, unspecified: Secondary | ICD-10-CM | POA: Diagnosis not present

## 2013-06-28 DIAGNOSIS — D472 Monoclonal gammopathy: Secondary | ICD-10-CM | POA: Diagnosis not present

## 2013-06-29 ENCOUNTER — Ambulatory Visit (INDEPENDENT_AMBULATORY_CARE_PROVIDER_SITE_OTHER): Payer: Medicare Other | Admitting: Infectious Diseases

## 2013-06-29 ENCOUNTER — Encounter: Payer: Self-pay | Admitting: Infectious Diseases

## 2013-06-29 VITALS — BP 150/91 | HR 96 | Temp 98.6°F

## 2013-06-29 DIAGNOSIS — R112 Nausea with vomiting, unspecified: Secondary | ICD-10-CM | POA: Diagnosis not present

## 2013-06-29 DIAGNOSIS — I471 Supraventricular tachycardia: Secondary | ICD-10-CM | POA: Diagnosis not present

## 2013-06-29 DIAGNOSIS — I10 Essential (primary) hypertension: Secondary | ICD-10-CM | POA: Diagnosis not present

## 2013-06-29 DIAGNOSIS — B20 Human immunodeficiency virus [HIV] disease: Secondary | ICD-10-CM

## 2013-06-29 DIAGNOSIS — R279 Unspecified lack of coordination: Secondary | ICD-10-CM | POA: Diagnosis not present

## 2013-06-29 DIAGNOSIS — M6281 Muscle weakness (generalized): Secondary | ICD-10-CM | POA: Diagnosis not present

## 2013-06-29 DIAGNOSIS — N186 End stage renal disease: Secondary | ICD-10-CM

## 2013-06-29 NOTE — Assessment & Plan Note (Signed)
Will defer mgmt of her Ca to her nephrologist.

## 2013-06-29 NOTE — Assessment & Plan Note (Signed)
She is asx. She will f/u with her PCP, nephrologist.

## 2013-06-29 NOTE — Assessment & Plan Note (Signed)
She is doing well. She was Hep B S Ab+ 2009.  Will see her back in 6 months, labs done at HD- needs lipids and RPR.  Offered/refused condoms.

## 2013-06-29 NOTE — Progress Notes (Signed)
   Subjective:    Patient ID: [name], female    DOB: [birth year], 63 y.o.   MRN: [id number]  HPI 63 yo F with HIV+, ESRD, staying at SNF. She is on ISN/AZT/3TC dosed for her HD. Her last CD4 was 312 and her HIV RNA was <20 (06-07-13). Her Ca was > 15. H/H 10.4/32.4.  Has been feeling well except for 2 episodes of emesis last pm. She is hoping to get off dialysis. She is not sure what would happen if she stops HD (I explained she would die).  She is not sure if her Ca was treated.  Makes small amt of urine  Review of Systems  Constitutional: Negative for appetite change and unexpected weight change.  Eyes: Negative for visual disturbance.  Gastrointestinal: Positive for vomiting. Negative for diarrhea and constipation.  Genitourinary: Negative for dysuria.       Objective:   Physical Exam  Constitutional: She appears well-developed and well-nourished.  HENT:  Mouth/Throat: No oropharyngeal exudate.  Eyes: EOM are normal. Pupils are equal, round, and reactive to light.  Neck: Neck supple.  Cardiovascular: Normal rate, regular rhythm and normal heart sounds.   Pulmonary/Chest: Effort normal and breath sounds normal.  Abdominal: Soft. Bowel sounds are normal. She exhibits no distension. There is no tenderness.  Musculoskeletal: She exhibits no edema.       Arms: Lymphadenopathy:    She has no cervical adenopathy.          Assessment & Plan:

## 2013-06-29 NOTE — Assessment & Plan Note (Signed)
Will watch, not clear if related to her GERD?

## 2013-06-30 DIAGNOSIS — I471 Supraventricular tachycardia: Secondary | ICD-10-CM | POA: Diagnosis not present

## 2013-06-30 DIAGNOSIS — D509 Iron deficiency anemia, unspecified: Secondary | ICD-10-CM | POA: Diagnosis not present

## 2013-06-30 DIAGNOSIS — M6281 Muscle weakness (generalized): Secondary | ICD-10-CM | POA: Diagnosis not present

## 2013-06-30 DIAGNOSIS — R279 Unspecified lack of coordination: Secondary | ICD-10-CM | POA: Diagnosis not present

## 2013-06-30 DIAGNOSIS — N186 End stage renal disease: Secondary | ICD-10-CM | POA: Diagnosis not present

## 2013-06-30 DIAGNOSIS — D472 Monoclonal gammopathy: Secondary | ICD-10-CM | POA: Diagnosis not present

## 2013-06-30 DIAGNOSIS — N2581 Secondary hyperparathyroidism of renal origin: Secondary | ICD-10-CM | POA: Diagnosis not present

## 2013-06-30 DIAGNOSIS — D631 Anemia in chronic kidney disease: Secondary | ICD-10-CM | POA: Diagnosis not present

## 2013-07-01 DIAGNOSIS — M6281 Muscle weakness (generalized): Secondary | ICD-10-CM | POA: Diagnosis not present

## 2013-07-01 DIAGNOSIS — R279 Unspecified lack of coordination: Secondary | ICD-10-CM | POA: Diagnosis not present

## 2013-07-01 DIAGNOSIS — I471 Supraventricular tachycardia: Secondary | ICD-10-CM | POA: Diagnosis not present

## 2013-07-03 DIAGNOSIS — M6281 Muscle weakness (generalized): Secondary | ICD-10-CM | POA: Diagnosis not present

## 2013-07-03 DIAGNOSIS — I471 Supraventricular tachycardia: Secondary | ICD-10-CM | POA: Diagnosis not present

## 2013-07-03 DIAGNOSIS — R279 Unspecified lack of coordination: Secondary | ICD-10-CM | POA: Diagnosis not present

## 2013-07-04 DIAGNOSIS — R279 Unspecified lack of coordination: Secondary | ICD-10-CM | POA: Diagnosis not present

## 2013-07-04 DIAGNOSIS — M6281 Muscle weakness (generalized): Secondary | ICD-10-CM | POA: Diagnosis not present

## 2013-07-04 DIAGNOSIS — I471 Supraventricular tachycardia: Secondary | ICD-10-CM | POA: Diagnosis not present

## 2013-07-05 DIAGNOSIS — N2581 Secondary hyperparathyroidism of renal origin: Secondary | ICD-10-CM | POA: Diagnosis not present

## 2013-07-05 DIAGNOSIS — D472 Monoclonal gammopathy: Secondary | ICD-10-CM | POA: Diagnosis not present

## 2013-07-05 DIAGNOSIS — N186 End stage renal disease: Secondary | ICD-10-CM | POA: Diagnosis not present

## 2013-07-05 DIAGNOSIS — N039 Chronic nephritic syndrome with unspecified morphologic changes: Secondary | ICD-10-CM | POA: Diagnosis not present

## 2013-07-05 DIAGNOSIS — D509 Iron deficiency anemia, unspecified: Secondary | ICD-10-CM | POA: Diagnosis not present

## 2013-07-05 DIAGNOSIS — D631 Anemia in chronic kidney disease: Secondary | ICD-10-CM | POA: Diagnosis not present

## 2013-07-07 DIAGNOSIS — D631 Anemia in chronic kidney disease: Secondary | ICD-10-CM | POA: Diagnosis not present

## 2013-07-07 DIAGNOSIS — N2581 Secondary hyperparathyroidism of renal origin: Secondary | ICD-10-CM | POA: Diagnosis not present

## 2013-07-07 DIAGNOSIS — I471 Supraventricular tachycardia: Secondary | ICD-10-CM | POA: Diagnosis not present

## 2013-07-07 DIAGNOSIS — D472 Monoclonal gammopathy: Secondary | ICD-10-CM | POA: Diagnosis not present

## 2013-07-07 DIAGNOSIS — N186 End stage renal disease: Secondary | ICD-10-CM | POA: Diagnosis not present

## 2013-07-07 DIAGNOSIS — M6281 Muscle weakness (generalized): Secondary | ICD-10-CM | POA: Diagnosis not present

## 2013-07-07 DIAGNOSIS — R279 Unspecified lack of coordination: Secondary | ICD-10-CM | POA: Diagnosis not present

## 2013-07-07 DIAGNOSIS — D509 Iron deficiency anemia, unspecified: Secondary | ICD-10-CM | POA: Diagnosis not present

## 2013-07-10 DIAGNOSIS — D509 Iron deficiency anemia, unspecified: Secondary | ICD-10-CM | POA: Diagnosis not present

## 2013-07-10 DIAGNOSIS — N2581 Secondary hyperparathyroidism of renal origin: Secondary | ICD-10-CM | POA: Diagnosis not present

## 2013-07-10 DIAGNOSIS — D631 Anemia in chronic kidney disease: Secondary | ICD-10-CM | POA: Diagnosis not present

## 2013-07-10 DIAGNOSIS — D472 Monoclonal gammopathy: Secondary | ICD-10-CM | POA: Diagnosis not present

## 2013-07-10 DIAGNOSIS — N186 End stage renal disease: Secondary | ICD-10-CM | POA: Diagnosis not present

## 2013-07-12 DIAGNOSIS — D472 Monoclonal gammopathy: Secondary | ICD-10-CM | POA: Diagnosis not present

## 2013-07-12 DIAGNOSIS — D631 Anemia in chronic kidney disease: Secondary | ICD-10-CM | POA: Diagnosis not present

## 2013-07-12 DIAGNOSIS — I471 Supraventricular tachycardia: Secondary | ICD-10-CM | POA: Diagnosis not present

## 2013-07-12 DIAGNOSIS — R279 Unspecified lack of coordination: Secondary | ICD-10-CM | POA: Diagnosis not present

## 2013-07-12 DIAGNOSIS — N2581 Secondary hyperparathyroidism of renal origin: Secondary | ICD-10-CM | POA: Diagnosis not present

## 2013-07-12 DIAGNOSIS — M6281 Muscle weakness (generalized): Secondary | ICD-10-CM | POA: Diagnosis not present

## 2013-07-12 DIAGNOSIS — N186 End stage renal disease: Secondary | ICD-10-CM | POA: Diagnosis not present

## 2013-07-12 DIAGNOSIS — D509 Iron deficiency anemia, unspecified: Secondary | ICD-10-CM | POA: Diagnosis not present

## 2013-07-13 DIAGNOSIS — G894 Chronic pain syndrome: Secondary | ICD-10-CM | POA: Diagnosis not present

## 2013-07-13 NOTE — Progress Notes (Signed)
This encounter was created in error - please disregard.

## 2013-07-14 DIAGNOSIS — N2581 Secondary hyperparathyroidism of renal origin: Secondary | ICD-10-CM | POA: Diagnosis not present

## 2013-07-14 DIAGNOSIS — D472 Monoclonal gammopathy: Secondary | ICD-10-CM | POA: Diagnosis not present

## 2013-07-14 DIAGNOSIS — M6281 Muscle weakness (generalized): Secondary | ICD-10-CM | POA: Diagnosis not present

## 2013-07-14 DIAGNOSIS — D509 Iron deficiency anemia, unspecified: Secondary | ICD-10-CM | POA: Diagnosis not present

## 2013-07-14 DIAGNOSIS — I471 Supraventricular tachycardia: Secondary | ICD-10-CM | POA: Diagnosis not present

## 2013-07-14 DIAGNOSIS — D631 Anemia in chronic kidney disease: Secondary | ICD-10-CM | POA: Diagnosis not present

## 2013-07-14 DIAGNOSIS — N186 End stage renal disease: Secondary | ICD-10-CM | POA: Diagnosis not present

## 2013-07-14 DIAGNOSIS — R279 Unspecified lack of coordination: Secondary | ICD-10-CM | POA: Diagnosis not present

## 2013-07-15 DIAGNOSIS — R279 Unspecified lack of coordination: Secondary | ICD-10-CM | POA: Diagnosis not present

## 2013-07-15 DIAGNOSIS — I471 Supraventricular tachycardia: Secondary | ICD-10-CM | POA: Diagnosis not present

## 2013-07-15 DIAGNOSIS — M6281 Muscle weakness (generalized): Secondary | ICD-10-CM | POA: Diagnosis not present

## 2013-07-17 DIAGNOSIS — D509 Iron deficiency anemia, unspecified: Secondary | ICD-10-CM | POA: Diagnosis not present

## 2013-07-17 DIAGNOSIS — D631 Anemia in chronic kidney disease: Secondary | ICD-10-CM | POA: Diagnosis not present

## 2013-07-17 DIAGNOSIS — N2581 Secondary hyperparathyroidism of renal origin: Secondary | ICD-10-CM | POA: Diagnosis not present

## 2013-07-17 DIAGNOSIS — D472 Monoclonal gammopathy: Secondary | ICD-10-CM | POA: Diagnosis not present

## 2013-07-17 DIAGNOSIS — N186 End stage renal disease: Secondary | ICD-10-CM | POA: Diagnosis not present

## 2013-07-19 DIAGNOSIS — N2581 Secondary hyperparathyroidism of renal origin: Secondary | ICD-10-CM | POA: Diagnosis not present

## 2013-07-19 DIAGNOSIS — M6281 Muscle weakness (generalized): Secondary | ICD-10-CM | POA: Diagnosis not present

## 2013-07-19 DIAGNOSIS — R279 Unspecified lack of coordination: Secondary | ICD-10-CM | POA: Diagnosis not present

## 2013-07-19 DIAGNOSIS — N186 End stage renal disease: Secondary | ICD-10-CM | POA: Diagnosis not present

## 2013-07-19 DIAGNOSIS — D472 Monoclonal gammopathy: Secondary | ICD-10-CM | POA: Diagnosis not present

## 2013-07-19 DIAGNOSIS — D631 Anemia in chronic kidney disease: Secondary | ICD-10-CM | POA: Diagnosis not present

## 2013-07-19 DIAGNOSIS — I471 Supraventricular tachycardia: Secondary | ICD-10-CM | POA: Diagnosis not present

## 2013-07-19 DIAGNOSIS — D509 Iron deficiency anemia, unspecified: Secondary | ICD-10-CM | POA: Diagnosis not present

## 2013-07-20 DIAGNOSIS — M6281 Muscle weakness (generalized): Secondary | ICD-10-CM | POA: Diagnosis not present

## 2013-07-20 DIAGNOSIS — R279 Unspecified lack of coordination: Secondary | ICD-10-CM | POA: Diagnosis not present

## 2013-07-20 DIAGNOSIS — I471 Supraventricular tachycardia: Secondary | ICD-10-CM | POA: Diagnosis not present

## 2013-07-21 ENCOUNTER — Encounter: Payer: Self-pay | Admitting: Infectious Diseases

## 2013-07-21 DIAGNOSIS — D631 Anemia in chronic kidney disease: Secondary | ICD-10-CM | POA: Diagnosis not present

## 2013-07-21 DIAGNOSIS — N2581 Secondary hyperparathyroidism of renal origin: Secondary | ICD-10-CM | POA: Diagnosis not present

## 2013-07-21 DIAGNOSIS — N186 End stage renal disease: Secondary | ICD-10-CM | POA: Diagnosis not present

## 2013-07-21 DIAGNOSIS — M6281 Muscle weakness (generalized): Secondary | ICD-10-CM | POA: Diagnosis not present

## 2013-07-21 DIAGNOSIS — D509 Iron deficiency anemia, unspecified: Secondary | ICD-10-CM | POA: Diagnosis not present

## 2013-07-21 DIAGNOSIS — D472 Monoclonal gammopathy: Secondary | ICD-10-CM | POA: Diagnosis not present

## 2013-07-21 DIAGNOSIS — I471 Supraventricular tachycardia: Secondary | ICD-10-CM | POA: Diagnosis not present

## 2013-07-21 DIAGNOSIS — R279 Unspecified lack of coordination: Secondary | ICD-10-CM | POA: Diagnosis not present

## 2013-07-24 DIAGNOSIS — N186 End stage renal disease: Secondary | ICD-10-CM | POA: Diagnosis not present

## 2013-07-24 DIAGNOSIS — D472 Monoclonal gammopathy: Secondary | ICD-10-CM | POA: Diagnosis not present

## 2013-07-24 DIAGNOSIS — D509 Iron deficiency anemia, unspecified: Secondary | ICD-10-CM | POA: Diagnosis not present

## 2013-07-24 DIAGNOSIS — N2581 Secondary hyperparathyroidism of renal origin: Secondary | ICD-10-CM | POA: Diagnosis not present

## 2013-07-24 DIAGNOSIS — D631 Anemia in chronic kidney disease: Secondary | ICD-10-CM | POA: Diagnosis not present

## 2013-07-25 DIAGNOSIS — R279 Unspecified lack of coordination: Secondary | ICD-10-CM | POA: Diagnosis not present

## 2013-07-25 DIAGNOSIS — I471 Supraventricular tachycardia: Secondary | ICD-10-CM | POA: Diagnosis not present

## 2013-07-25 DIAGNOSIS — M6281 Muscle weakness (generalized): Secondary | ICD-10-CM | POA: Diagnosis not present

## 2013-07-26 DIAGNOSIS — N2581 Secondary hyperparathyroidism of renal origin: Secondary | ICD-10-CM | POA: Diagnosis not present

## 2013-07-26 DIAGNOSIS — D509 Iron deficiency anemia, unspecified: Secondary | ICD-10-CM | POA: Diagnosis not present

## 2013-07-26 DIAGNOSIS — M6281 Muscle weakness (generalized): Secondary | ICD-10-CM | POA: Diagnosis not present

## 2013-07-26 DIAGNOSIS — R279 Unspecified lack of coordination: Secondary | ICD-10-CM | POA: Diagnosis not present

## 2013-07-26 DIAGNOSIS — D472 Monoclonal gammopathy: Secondary | ICD-10-CM | POA: Diagnosis not present

## 2013-07-26 DIAGNOSIS — N186 End stage renal disease: Secondary | ICD-10-CM | POA: Diagnosis not present

## 2013-07-26 DIAGNOSIS — I471 Supraventricular tachycardia: Secondary | ICD-10-CM | POA: Diagnosis not present

## 2013-07-26 DIAGNOSIS — D631 Anemia in chronic kidney disease: Secondary | ICD-10-CM | POA: Diagnosis not present

## 2013-07-26 DIAGNOSIS — N039 Chronic nephritic syndrome with unspecified morphologic changes: Secondary | ICD-10-CM | POA: Diagnosis not present

## 2013-07-27 DIAGNOSIS — I471 Supraventricular tachycardia: Secondary | ICD-10-CM | POA: Diagnosis not present

## 2013-07-27 DIAGNOSIS — M6281 Muscle weakness (generalized): Secondary | ICD-10-CM | POA: Diagnosis not present

## 2013-07-27 DIAGNOSIS — R279 Unspecified lack of coordination: Secondary | ICD-10-CM | POA: Diagnosis not present

## 2013-07-28 DIAGNOSIS — N2581 Secondary hyperparathyroidism of renal origin: Secondary | ICD-10-CM | POA: Diagnosis not present

## 2013-07-28 DIAGNOSIS — D472 Monoclonal gammopathy: Secondary | ICD-10-CM | POA: Diagnosis not present

## 2013-07-28 DIAGNOSIS — N186 End stage renal disease: Secondary | ICD-10-CM | POA: Diagnosis not present

## 2013-07-28 DIAGNOSIS — D509 Iron deficiency anemia, unspecified: Secondary | ICD-10-CM | POA: Diagnosis not present

## 2013-07-28 DIAGNOSIS — D631 Anemia in chronic kidney disease: Secondary | ICD-10-CM | POA: Diagnosis not present

## 2013-07-30 DIAGNOSIS — N186 End stage renal disease: Secondary | ICD-10-CM | POA: Diagnosis not present

## 2013-07-31 DIAGNOSIS — D509 Iron deficiency anemia, unspecified: Secondary | ICD-10-CM | POA: Diagnosis not present

## 2013-07-31 DIAGNOSIS — D472 Monoclonal gammopathy: Secondary | ICD-10-CM | POA: Diagnosis not present

## 2013-07-31 DIAGNOSIS — D631 Anemia in chronic kidney disease: Secondary | ICD-10-CM | POA: Diagnosis not present

## 2013-07-31 DIAGNOSIS — N2581 Secondary hyperparathyroidism of renal origin: Secondary | ICD-10-CM | POA: Diagnosis not present

## 2013-07-31 DIAGNOSIS — N186 End stage renal disease: Secondary | ICD-10-CM | POA: Diagnosis not present

## 2013-07-31 DIAGNOSIS — N039 Chronic nephritic syndrome with unspecified morphologic changes: Secondary | ICD-10-CM | POA: Diagnosis not present

## 2013-08-02 DIAGNOSIS — D631 Anemia in chronic kidney disease: Secondary | ICD-10-CM | POA: Diagnosis not present

## 2013-08-02 DIAGNOSIS — M6281 Muscle weakness (generalized): Secondary | ICD-10-CM | POA: Diagnosis not present

## 2013-08-02 DIAGNOSIS — D509 Iron deficiency anemia, unspecified: Secondary | ICD-10-CM | POA: Diagnosis not present

## 2013-08-02 DIAGNOSIS — R279 Unspecified lack of coordination: Secondary | ICD-10-CM | POA: Diagnosis not present

## 2013-08-02 DIAGNOSIS — D472 Monoclonal gammopathy: Secondary | ICD-10-CM | POA: Diagnosis not present

## 2013-08-02 DIAGNOSIS — I471 Supraventricular tachycardia: Secondary | ICD-10-CM | POA: Diagnosis not present

## 2013-08-02 DIAGNOSIS — N039 Chronic nephritic syndrome with unspecified morphologic changes: Secondary | ICD-10-CM | POA: Diagnosis not present

## 2013-08-02 DIAGNOSIS — N186 End stage renal disease: Secondary | ICD-10-CM | POA: Diagnosis not present

## 2013-08-02 DIAGNOSIS — N2581 Secondary hyperparathyroidism of renal origin: Secondary | ICD-10-CM | POA: Diagnosis not present

## 2013-08-03 DIAGNOSIS — M6281 Muscle weakness (generalized): Secondary | ICD-10-CM | POA: Diagnosis not present

## 2013-08-03 DIAGNOSIS — I471 Supraventricular tachycardia: Secondary | ICD-10-CM | POA: Diagnosis not present

## 2013-08-03 DIAGNOSIS — R279 Unspecified lack of coordination: Secondary | ICD-10-CM | POA: Diagnosis not present

## 2013-08-04 DIAGNOSIS — I471 Supraventricular tachycardia: Secondary | ICD-10-CM | POA: Diagnosis not present

## 2013-08-04 DIAGNOSIS — N039 Chronic nephritic syndrome with unspecified morphologic changes: Secondary | ICD-10-CM | POA: Diagnosis not present

## 2013-08-04 DIAGNOSIS — D631 Anemia in chronic kidney disease: Secondary | ICD-10-CM | POA: Diagnosis not present

## 2013-08-04 DIAGNOSIS — D472 Monoclonal gammopathy: Secondary | ICD-10-CM | POA: Diagnosis not present

## 2013-08-04 DIAGNOSIS — D509 Iron deficiency anemia, unspecified: Secondary | ICD-10-CM | POA: Diagnosis not present

## 2013-08-04 DIAGNOSIS — N2581 Secondary hyperparathyroidism of renal origin: Secondary | ICD-10-CM | POA: Diagnosis not present

## 2013-08-04 DIAGNOSIS — N186 End stage renal disease: Secondary | ICD-10-CM | POA: Diagnosis not present

## 2013-08-04 DIAGNOSIS — R279 Unspecified lack of coordination: Secondary | ICD-10-CM | POA: Diagnosis not present

## 2013-08-04 DIAGNOSIS — M6281 Muscle weakness (generalized): Secondary | ICD-10-CM | POA: Diagnosis not present

## 2013-08-07 DIAGNOSIS — M6281 Muscle weakness (generalized): Secondary | ICD-10-CM | POA: Diagnosis not present

## 2013-08-07 DIAGNOSIS — D472 Monoclonal gammopathy: Secondary | ICD-10-CM | POA: Diagnosis not present

## 2013-08-07 DIAGNOSIS — I471 Supraventricular tachycardia: Secondary | ICD-10-CM | POA: Diagnosis not present

## 2013-08-07 DIAGNOSIS — N2581 Secondary hyperparathyroidism of renal origin: Secondary | ICD-10-CM | POA: Diagnosis not present

## 2013-08-07 DIAGNOSIS — D631 Anemia in chronic kidney disease: Secondary | ICD-10-CM | POA: Diagnosis not present

## 2013-08-07 DIAGNOSIS — N039 Chronic nephritic syndrome with unspecified morphologic changes: Secondary | ICD-10-CM | POA: Diagnosis not present

## 2013-08-07 DIAGNOSIS — D509 Iron deficiency anemia, unspecified: Secondary | ICD-10-CM | POA: Diagnosis not present

## 2013-08-07 DIAGNOSIS — N186 End stage renal disease: Secondary | ICD-10-CM | POA: Diagnosis not present

## 2013-08-07 DIAGNOSIS — R279 Unspecified lack of coordination: Secondary | ICD-10-CM | POA: Diagnosis not present

## 2013-08-08 DIAGNOSIS — R279 Unspecified lack of coordination: Secondary | ICD-10-CM | POA: Diagnosis not present

## 2013-08-08 DIAGNOSIS — M6281 Muscle weakness (generalized): Secondary | ICD-10-CM | POA: Diagnosis not present

## 2013-08-08 DIAGNOSIS — I471 Supraventricular tachycardia: Secondary | ICD-10-CM | POA: Diagnosis not present

## 2013-08-09 DIAGNOSIS — N2581 Secondary hyperparathyroidism of renal origin: Secondary | ICD-10-CM | POA: Diagnosis not present

## 2013-08-09 DIAGNOSIS — D472 Monoclonal gammopathy: Secondary | ICD-10-CM | POA: Diagnosis not present

## 2013-08-09 DIAGNOSIS — D509 Iron deficiency anemia, unspecified: Secondary | ICD-10-CM | POA: Diagnosis not present

## 2013-08-09 DIAGNOSIS — D631 Anemia in chronic kidney disease: Secondary | ICD-10-CM | POA: Diagnosis not present

## 2013-08-09 DIAGNOSIS — N186 End stage renal disease: Secondary | ICD-10-CM | POA: Diagnosis not present

## 2013-08-09 DIAGNOSIS — N039 Chronic nephritic syndrome with unspecified morphologic changes: Secondary | ICD-10-CM | POA: Diagnosis not present

## 2013-08-10 DIAGNOSIS — M6281 Muscle weakness (generalized): Secondary | ICD-10-CM | POA: Diagnosis not present

## 2013-08-10 DIAGNOSIS — I471 Supraventricular tachycardia: Secondary | ICD-10-CM | POA: Diagnosis not present

## 2013-08-10 DIAGNOSIS — R279 Unspecified lack of coordination: Secondary | ICD-10-CM | POA: Diagnosis not present

## 2013-08-11 DIAGNOSIS — D509 Iron deficiency anemia, unspecified: Secondary | ICD-10-CM | POA: Diagnosis not present

## 2013-08-11 DIAGNOSIS — D472 Monoclonal gammopathy: Secondary | ICD-10-CM | POA: Diagnosis not present

## 2013-08-11 DIAGNOSIS — N186 End stage renal disease: Secondary | ICD-10-CM | POA: Diagnosis not present

## 2013-08-11 DIAGNOSIS — N039 Chronic nephritic syndrome with unspecified morphologic changes: Secondary | ICD-10-CM | POA: Diagnosis not present

## 2013-08-11 DIAGNOSIS — D631 Anemia in chronic kidney disease: Secondary | ICD-10-CM | POA: Diagnosis not present

## 2013-08-11 DIAGNOSIS — N2581 Secondary hyperparathyroidism of renal origin: Secondary | ICD-10-CM | POA: Diagnosis not present

## 2013-08-14 DIAGNOSIS — M6281 Muscle weakness (generalized): Secondary | ICD-10-CM | POA: Diagnosis not present

## 2013-08-14 DIAGNOSIS — D472 Monoclonal gammopathy: Secondary | ICD-10-CM | POA: Diagnosis not present

## 2013-08-14 DIAGNOSIS — N186 End stage renal disease: Secondary | ICD-10-CM | POA: Diagnosis not present

## 2013-08-14 DIAGNOSIS — I471 Supraventricular tachycardia: Secondary | ICD-10-CM | POA: Diagnosis not present

## 2013-08-14 DIAGNOSIS — R279 Unspecified lack of coordination: Secondary | ICD-10-CM | POA: Diagnosis not present

## 2013-08-14 DIAGNOSIS — D631 Anemia in chronic kidney disease: Secondary | ICD-10-CM | POA: Diagnosis not present

## 2013-08-14 DIAGNOSIS — N2581 Secondary hyperparathyroidism of renal origin: Secondary | ICD-10-CM | POA: Diagnosis not present

## 2013-08-14 DIAGNOSIS — D509 Iron deficiency anemia, unspecified: Secondary | ICD-10-CM | POA: Diagnosis not present

## 2013-08-15 DIAGNOSIS — I471 Supraventricular tachycardia: Secondary | ICD-10-CM | POA: Diagnosis not present

## 2013-08-15 DIAGNOSIS — M6281 Muscle weakness (generalized): Secondary | ICD-10-CM | POA: Diagnosis not present

## 2013-08-15 DIAGNOSIS — R279 Unspecified lack of coordination: Secondary | ICD-10-CM | POA: Diagnosis not present

## 2013-08-16 DIAGNOSIS — D631 Anemia in chronic kidney disease: Secondary | ICD-10-CM | POA: Diagnosis not present

## 2013-08-16 DIAGNOSIS — D472 Monoclonal gammopathy: Secondary | ICD-10-CM | POA: Diagnosis not present

## 2013-08-16 DIAGNOSIS — N039 Chronic nephritic syndrome with unspecified morphologic changes: Secondary | ICD-10-CM | POA: Diagnosis not present

## 2013-08-16 DIAGNOSIS — N186 End stage renal disease: Secondary | ICD-10-CM | POA: Diagnosis not present

## 2013-08-16 DIAGNOSIS — N2581 Secondary hyperparathyroidism of renal origin: Secondary | ICD-10-CM | POA: Diagnosis not present

## 2013-08-16 DIAGNOSIS — D509 Iron deficiency anemia, unspecified: Secondary | ICD-10-CM | POA: Diagnosis not present

## 2013-08-17 DIAGNOSIS — R279 Unspecified lack of coordination: Secondary | ICD-10-CM | POA: Diagnosis not present

## 2013-08-17 DIAGNOSIS — M6281 Muscle weakness (generalized): Secondary | ICD-10-CM | POA: Diagnosis not present

## 2013-08-17 DIAGNOSIS — I471 Supraventricular tachycardia: Secondary | ICD-10-CM | POA: Diagnosis not present

## 2013-08-18 ENCOUNTER — Encounter (HOSPITAL_COMMUNITY): Payer: Self-pay | Admitting: Emergency Medicine

## 2013-08-18 ENCOUNTER — Emergency Department (HOSPITAL_COMMUNITY)
Admission: EM | Admit: 2013-08-18 | Discharge: 2013-08-18 | Disposition: A | Discharge status: Skilled Nursing Facility | Payer: Medicare Other | Attending: Emergency Medicine | Admitting: Emergency Medicine

## 2013-08-18 DIAGNOSIS — Z79899 Other long term (current) drug therapy: Secondary | ICD-10-CM | POA: Insufficient documentation

## 2013-08-18 DIAGNOSIS — Z880 Allergy status to penicillin: Secondary | ICD-10-CM | POA: Diagnosis not present

## 2013-08-18 DIAGNOSIS — R6889 Other general symptoms and signs: Secondary | ICD-10-CM | POA: Diagnosis not present

## 2013-08-18 DIAGNOSIS — Z8719 Personal history of other diseases of the digestive system: Secondary | ICD-10-CM | POA: Insufficient documentation

## 2013-08-18 DIAGNOSIS — G4700 Insomnia, unspecified: Secondary | ICD-10-CM | POA: Diagnosis not present

## 2013-08-18 DIAGNOSIS — F411 Generalized anxiety disorder: Secondary | ICD-10-CM | POA: Diagnosis not present

## 2013-08-18 DIAGNOSIS — I10 Essential (primary) hypertension: Secondary | ICD-10-CM | POA: Diagnosis not present

## 2013-08-18 DIAGNOSIS — Z862 Personal history of diseases of the blood and blood-forming organs and certain disorders involving the immune mechanism: Secondary | ICD-10-CM | POA: Insufficient documentation

## 2013-08-18 DIAGNOSIS — I120 Hypertensive chronic kidney disease with stage 5 chronic kidney disease or end stage renal disease: Secondary | ICD-10-CM | POA: Insufficient documentation

## 2013-08-18 DIAGNOSIS — N186 End stage renal disease: Secondary | ICD-10-CM | POA: Diagnosis not present

## 2013-08-18 DIAGNOSIS — M79609 Pain in unspecified limb: Secondary | ICD-10-CM | POA: Diagnosis not present

## 2013-08-18 DIAGNOSIS — Z91040 Latex allergy status: Secondary | ICD-10-CM | POA: Insufficient documentation

## 2013-08-18 DIAGNOSIS — Z21 Asymptomatic human immunodeficiency virus [HIV] infection status: Secondary | ICD-10-CM | POA: Insufficient documentation

## 2013-08-18 DIAGNOSIS — Z992 Dependence on renal dialysis: Secondary | ICD-10-CM | POA: Diagnosis not present

## 2013-08-18 DIAGNOSIS — Z87891 Personal history of nicotine dependence: Secondary | ICD-10-CM | POA: Insufficient documentation

## 2013-08-18 DIAGNOSIS — F329 Major depressive disorder, single episode, unspecified: Secondary | ICD-10-CM | POA: Insufficient documentation

## 2013-08-18 DIAGNOSIS — K632 Fistula of intestine: Secondary | ICD-10-CM | POA: Diagnosis not present

## 2013-08-18 DIAGNOSIS — F3289 Other specified depressive episodes: Secondary | ICD-10-CM | POA: Insufficient documentation

## 2013-08-18 DIAGNOSIS — Z87828 Personal history of other (healed) physical injury and trauma: Secondary | ICD-10-CM | POA: Insufficient documentation

## 2013-08-18 DIAGNOSIS — M7989 Other specified soft tissue disorders: Secondary | ICD-10-CM | POA: Diagnosis not present

## 2013-08-18 LAB — CBC WITH DIFFERENTIAL/PLATELET
BASOS ABS: 0 10*3/uL (ref 0.0–0.1)
BASOS PCT: 0 % (ref 0–1)
EOS ABS: 0.1 10*3/uL (ref 0.0–0.7)
EOS PCT: 2 % (ref 0–5)
HEMATOCRIT: 32.8 % — AB (ref 36.0–46.0)
HEMOGLOBIN: 10.6 g/dL — AB (ref 12.0–15.0)
Lymphocytes Relative: 38 % (ref 12–46)
Lymphs Abs: 1.2 10*3/uL (ref 0.7–4.0)
MCH: 33.5 pg (ref 26.0–34.0)
MCHC: 32.3 g/dL (ref 30.0–36.0)
MCV: 103.8 fL — AB (ref 78.0–100.0)
MONO ABS: 0.2 10*3/uL (ref 0.1–1.0)
MONOS PCT: 6 % (ref 3–12)
NEUTROS ABS: 1.7 10*3/uL (ref 1.7–7.7)
Neutrophils Relative %: 55 % (ref 43–77)
Platelets: 113 10*3/uL — ABNORMAL LOW (ref 150–400)
RBC: 3.16 MIL/uL — ABNORMAL LOW (ref 3.87–5.11)
RDW: 14.2 % (ref 11.5–15.5)
WBC: 3.1 10*3/uL — ABNORMAL LOW (ref 4.0–10.5)

## 2013-08-18 LAB — BASIC METABOLIC PANEL
BUN: 41 mg/dL — AB (ref 6–23)
CALCIUM: 10.7 mg/dL — AB (ref 8.4–10.5)
CO2: 24 mEq/L (ref 19–32)
CREATININE: 9.26 mg/dL — AB (ref 0.50–1.10)
Chloride: 101 mEq/L (ref 96–112)
GFR, EST AFRICAN AMERICAN: 5 mL/min — AB (ref >90)
GFR, EST NON AFRICAN AMERICAN: 4 mL/min — AB (ref >90)
GLUCOSE: 82 mg/dL (ref 70–99)
POTASSIUM: 4.6 meq/L (ref 3.7–5.3)
Sodium: 139 mEq/L (ref 137–147)

## 2013-08-18 LAB — I-STAT CG4 LACTIC ACID, ED: Lactic Acid, Venous: 0.83 mmol/L (ref 0.5–2.2)

## 2013-08-18 NOTE — Discharge Instructions (Signed)
Recheck with your vascular surgeon if you have worsening or continued swelling of your extremity.  Edema Edema is an abnormal buildup of fluids in your bodytissues. Edema is somewhatdependent on gravity to pull the fluid to the lowest place in your body. That makes the condition more common in the legs and thighs (lower extremities). Painless swelling of the feet and ankles is common and becomes more likely as you get older. It is also common in looser tissues, like around your eyes.  When the affected area is squeezed, the fluid may move out of that spot and leave a dent for a few moments. This dent is called pitting.  CAUSES  There are many possible causes of edema. Eating too much salt and being on your feet or sitting for a long time can cause edema in your legs and ankles. Hot weather may make edema worse. Common medical causes of edema include:  Heart failure.  Liver disease.  Kidney disease.  Weak blood vessels in your legs.  Cancer.  An injury.  Pregnancy.  Some medications.  Obesity. SYMPTOMS  Edema is usually painless.Your skin may look swollen or shiny.  DIAGNOSIS  Your health care provider may be able to diagnose edema by asking about your medical history and doing a physical exam. You may need to have tests such as X-rays, an electrocardiogram, or blood tests to check for medical conditions that may cause edema.  TREATMENT  Edema treatment depends on the cause. If you have heart, liver, or kidney disease, you need the treatment appropriate for these conditions. General treatment may include:  Elevation of the affected body part above the level of your heart.  Compression of the affected body part. Pressure from elastic bandages or support stockings squeezes the tissues and forces fluid back into the blood vessels. This keeps fluid from entering the tissues.  Restriction of fluid and salt intake.  Use of a water pill (diuretic). These medications are appropriate  only for some types of edema. They pull fluid out of your body and make you urinate more often. This gets rid of fluid and reduces swelling, but diuretics can have side effects. Only use diuretics as directed by your health care provider. HOME CARE INSTRUCTIONS   Keep the affected body part above the level of your heart when you are lying down.   Do not sit still or stand for prolonged periods.   Do not put anything directly under your knees when lying down.  Do not wear constricting clothing or garters on your upper legs.   Exercise your legs to work the fluid back into your blood vessels. This may help the swelling go down.   Wear elastic bandages or support stockings to reduce ankle swelling as directed by your health care provider.   Eat a low-salt diet to reduce fluid if your health care provider recommends it.   Only take medicines as directed by your health care provider. SEEK MEDICAL CARE IF:   Your edema is not responding to treatment.  You have heart, liver, or kidney disease and notice symptoms of edema.  You have edema in your legs that does not improve after elevating them.   You have sudden and unexplained weight gain. SEEK IMMEDIATE MEDICAL CARE IF:   You develop shortness of breath or chest pain.   You cannot breathe when you lie down.  You develop pain, redness, or warmth in the swollen areas.   You have heart, liver, or kidney disease and suddenly  get edema.  You have a fever and your symptoms suddenly get worse. MAKE SURE YOU:   Understand these instructions.  Will watch your condition.  Will get help right away if you are not doing well or get worse. Document Released: 02/16/2005 Document Revised: 02/21/2013 Document Reviewed: 12/09/2012 [facility] Patient Information 2015 [place], [place]. This information is not intended to replace advice given to you by your health care provider. Make sure you discuss any questions you have with your health  care provider.

## 2013-08-18 NOTE — ED Notes (Signed)
[facility]AR CALLED  °

## 2013-08-18 NOTE — ED Notes (Signed)
Report given to [facility]

## 2013-08-18 NOTE — ED Notes (Addendum)
Communication with [facility]. Requesting appointment for Pt. On tomorrow off schedule per EDP Jeneen Rinks.  Appointment at 10:30 am will notify [facility] [phone]

## 2013-08-18 NOTE — ED Notes (Signed)
63 yo from [facility] in [place] with Swelling of the Right A/V Graft extremity w/ decreased pulse and warm to touch. States that it swells after dialysis but goes down in 12 hrs, this time the swelling has not gone done. Right Extremity is free from pain. However the Left extremity where the old Graft was has to Large lumps that are painful 4/10. Dialysis M/W/F. A/O x3

## 2013-08-18 NOTE — ED Provider Notes (Signed)
CSN: [id number]     Arrival date & time 08/18/13  0800 History   First MD Initiated Contact with Patient 08/18/13 [phone]     Chief Complaint  Patient presents with  . Circulatory Problem      HPI  Patient presents with complaint that her right arm is swollen. His history of end-stage renal disease. She stays at a care facility in refill. Gets Monday Wednesday Friday hemodialysis. Has had multiple grafts. Her current functional graft is 80 graft in her right forearm. She has a failed graft in her right upper arm, and left upper arm she states her arm particularly swollen before and after dialysis on Monday usually gets better by Wednesday or Friday. Her arm is still somewhat swollen and the nurses at her care facility became concerned. They referred her here by ambulance.  Is not painful. She is not having fever shakes or chills. She does not notice that it has been red. She states it "it is a little swollen"  Past Medical History  Diagnosis Date  . Pulmonary HTN   . Hypertension   . Depression   . Insomnia   . Chronic diarrhea   . HIV (human immunodeficiency virus infection)   . ESRD on hemodialysis   . C. difficile colitis 10/10/11  . Muscle weakness (generalized)   . Intracranial hemorrhage   . Anxiety   . Anemia    Past Surgical History  Procedure Laterality Date  . Dialysis shunts      previous one removed from left arm and now present one in right arm  . Esophagogastroduodenoscopy  12/15/2010    Procedure: ESOPHAGOGASTRODUODENOSCOPY (EGD);  Surgeon: Daneil Dolin, MD;  Location: [facility] ENDO SUITE;  Service: Endoscopy;  Laterality: N/A;  . Tonsillectomy    . Biopsy thyroid    . Excision of breast biopsy Right 05/04/2012    Procedure: EXCISION OF BREAST BIOPSY;  Surgeon: Donato Heinz, MD;  Location: [facility] ORS;  Service: General;  Laterality: Right;  Right Excisional Breast Biopsy   Family History  Problem Relation Age of Onset  . Anesthesia problems Neg Hx   . Hypotension Neg Hx    . Malignant hyperthermia Neg Hx   . Pseudochol deficiency Neg Hx    History  Substance Use Topics  . Smoking status: Former Research scientist (life sciences)  . Smokeless tobacco: Never Used  . Alcohol Use: No   OB History   Grav Para Term Preterm Abortions TAB SAB Ect Mult Living   1 1 1       1      Review of Systems  Constitutional: Negative for fever, chills, diaphoresis, appetite change and fatigue.  HENT: Negative for mouth sores, sore throat and trouble swallowing.   Eyes: Negative for visual disturbance.  Respiratory: Negative for cough, chest tightness, shortness of breath and wheezing.   Cardiovascular: Negative for chest pain.       Swelling of the right arm  Gastrointestinal: Negative for nausea, vomiting, abdominal pain, diarrhea and abdominal distention.  Endocrine: Negative for polydipsia, polyphagia and polyuria.  Genitourinary: Negative for dysuria, frequency and hematuria.  Musculoskeletal: Negative for gait problem.  Skin: Negative for color change, pallor and rash.  Neurological: Negative for dizziness, syncope, light-headedness and headaches.  Hematological: Does not bruise/bleed easily.  Psychiatric/Behavioral: Negative for behavioral problems and confusion.      Allergies  Penicillins and Latex  Home Medications   Prior to Admission medications   Medication Sig Start Date End Date Taking? Authorizing Provider  aluminum & magnesium hydroxide-simethicone (MYLANTA) 500-450-40 MG/5ML suspension Take 30 mLs by mouth every 6 (six) hours as needed. Indigestion    Historical Provider, MD  amLODipine (NORVASC) 10 MG tablet Take 1 tablet (10 mg total) by mouth at bedtime. 01/01/12   Velvet Bathe, MD  b complex-vitamin c-folic acid (NEPHRO-VITE) 0.8 MG TABS Take 0.8 mg by mouth at bedtime.      Historical Provider, MD  calcium acetate (PHOSLO) 667 MG capsule Take 1,334 mg by mouth 3 (three) times daily with meals.    Historical Provider, MD  cinacalcet (SENSIPAR) 30 MG tablet Take 30 mg  by mouth daily with supper.    Historical Provider, MD  DULoxetine (CYMBALTA) 60 MG capsule Take 60 mg by mouth daily.    Historical Provider, MD  epoetin alfa (EPOGEN,PROCRIT) 24401 UNIT/ML injection Inject 8,000 Units into the skin daily. To be given at dialysis    Historical Provider, MD  fentaNYL (DURAGESIC - DOSED MCG/HR) 12 MCG/HR Apply one patch along with 57mcg to equal 22mcg every 72 hours. Rotate sites 06/12/13   Tiffany L Reed, DO  fentaNYL (DURAGESIC - DOSED MCG/HR) 25 MCG/HR patch Apply one patch along with 33mcg to equal 86mcg every 72 hours. Rotate sites 06/12/13   Tiffany L Reed, DO  hydroxypropyl methylcellulose (ISOPTO TEARS) 2.5 % ophthalmic solution Place 1 drop into both eyes 2 (two) times daily.      Historical Provider, MD  ipratropium-albuterol (DUONEB) 0.5-2.5 (3) MG/3ML SOLN Take 3 mLs by nebulization every 6 (six) hours as needed. For shortness of breath/ wheezing    Historical Provider, MD  lamivudine (EPIVIR) 100 MG tablet Take 0.5 tablets (50 mg total) by mouth daily. Take one half tablet daily 06/11/10   Campbell Riches, MD  lanthanum (FOSRENOL) 500 MG chewable tablet Chew 500-1,500 mg by mouth 5 (five) times daily. 1500 mg with meals and 500 mg at 2000 hours with snack and at bedtime    Historical Provider, MD  lidocaine (LIDODERM) 5 % Place 1 patch onto the skin daily. Remove & Discard patch within 12 hours or as directed by MD     Historical Provider, MD  lidocaine-prilocaine (EMLA) cream Apply 1 application topically as needed. Apply 1 hour prior to dialysis access site    Historical Provider, MD  LORazepam (ATIVAN) 0.5 MG tablet Take 0.25 mg by mouth every 6 (six) hours as needed for anxiety.    Historical Provider, MD  Menthol, Topical Analgesic, (BIOFREEZE EX) Apply 1 application topically 4 (four) times daily as needed. For pain **do not apply near dialysis site**    Historical Provider, MD  ondansetron (ZOFRAN) 4 MG tablet Take 2 mg by mouth every 4 (four) hours  as needed. nausea    Historical Provider, MD  ondansetron (ZOFRAN-ODT) 4 MG disintegrating tablet Take 1 tablet (4 mg total) by mouth every 8 (eight) hours as needed for nausea. 12/15/12   Campbell Riches, MD  oxyCODONE (OXY IR/ROXICODONE) 5 MG immediate release tablet Take 1-2 tablets (5-10 mg total) by mouth at bedtime. 05/04/12   Donato Heinz, MD  pantoprazole (PROTONIX) 40 MG tablet Take 40 mg by mouth 2 (two) times daily.    Historical Provider, MD  raltegravir (ISENTRESS) 400 MG tablet Take 400 mg by mouth 2 (two) times daily.     Historical Provider, MD  sildenafil (REVATIO) 20 MG tablet Take 20 mg by mouth 3 (three) times daily.     Historical Provider, MD  vitamin C (ASCORBIC ACID)  500 MG tablet Take 500 mg by mouth 2 (two) times daily.    Historical Provider, MD  zidovudine (RETROVIR) 100 MG capsule Take 100 mg by mouth 3 (three) times daily.      Historical Provider, MD  zolpidem (AMBIEN) 5 MG tablet Take one tablet by mouth every night at bedtime as needed for sleep 05/29/13   Tiffany L Reed, DO   BP 179/81  Pulse 55  Temp(Src) 98 F (36.7 C) (Oral)  Resp 23  Ht 5\' 2"  (1.575 m)  SpO2 98% Physical Exam  Constitutional: She is oriented to person, place, and time. She appears well-developed and well-nourished. No distress.  HENT:  Head: Normocephalic.  Eyes: Conjunctivae are normal. Pupils are equal, round, and reactive to light. No scleral icterus.  Neck: Normal range of motion. Neck supple. No thyromegaly present.  Cardiovascular: Normal rate and regular rhythm.  Exam reveals no gallop and no friction rub.   No murmur heard. 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 Right upper extremity exam shows the right arm, forearm AV graft with palpable thrill and bruit. Excellent perfusion to the hand. Darvocet erythematous. It is trace diffuse edema. It is not painful. Full range of motion of the shoulder elbow and wrist. Normal sensation distally  Pulmonary/Chest: Effort normal  and breath sounds normal. No respiratory distress. She has no wheezes. She has no rales.  Abdominal: Soft. Bowel sounds are normal. She exhibits no distension. There is no tenderness. There is no rebound.  Musculoskeletal: Normal range of motion.  Neurological: She is alert and oriented to person, place, and time.  Skin: Skin is warm and dry. No rash noted.  Psychiatric: She has a normal mood and affect. Her behavior is normal.    ED Course  Procedures (including critical care time) Labs Review Labs Reviewed  CBC WITH DIFFERENTIAL  BASIC METABOLIC PANEL  I-STAT CG4 LACTIC ACID, ED    Imaging Review No results found.   EKG Interpretation None      MDM   Final diagnoses:  None    Not concerning exam. Normal exam of her graft. The Doppler ultrasound of her venous anatomy history she doesn't have DVT that would explain her intermittent swelling. She is afebrile here. Lactate, and CBC pending. She shows no signs of being fluid overloaded diffusely. No edema of her left upper bilateral lower extremities.   No fever. No leukocytosis. No DVT. Normal exam of her shunt. Plan is discharge home. Recheck with any continued or worsening swelling of the extremity.   Tanna Furry, MD 08/18/13 1134

## 2013-08-18 NOTE — Progress Notes (Signed)
*  Preliminary Results* Right upper extremity venous duplex completed. Right upper extremity is negative for deep and superficial vein thrombosis.  08/18/2013 1:51 PM  Maudry Mayhew, RVT, RDCS, RDMS

## 2013-08-18 NOTE — ED Notes (Signed)
Ultrasound Communication for status of testing.

## 2013-08-19 DIAGNOSIS — D509 Iron deficiency anemia, unspecified: Secondary | ICD-10-CM | POA: Diagnosis not present

## 2013-08-19 DIAGNOSIS — N186 End stage renal disease: Secondary | ICD-10-CM | POA: Diagnosis not present

## 2013-08-19 DIAGNOSIS — D631 Anemia in chronic kidney disease: Secondary | ICD-10-CM | POA: Diagnosis not present

## 2013-08-19 DIAGNOSIS — D472 Monoclonal gammopathy: Secondary | ICD-10-CM | POA: Diagnosis not present

## 2013-08-19 DIAGNOSIS — N2581 Secondary hyperparathyroidism of renal origin: Secondary | ICD-10-CM | POA: Diagnosis not present

## 2013-08-21 DIAGNOSIS — D509 Iron deficiency anemia, unspecified: Secondary | ICD-10-CM | POA: Diagnosis not present

## 2013-08-21 DIAGNOSIS — D631 Anemia in chronic kidney disease: Secondary | ICD-10-CM | POA: Diagnosis not present

## 2013-08-21 DIAGNOSIS — D472 Monoclonal gammopathy: Secondary | ICD-10-CM | POA: Diagnosis not present

## 2013-08-21 DIAGNOSIS — N2581 Secondary hyperparathyroidism of renal origin: Secondary | ICD-10-CM | POA: Diagnosis not present

## 2013-08-21 DIAGNOSIS — N186 End stage renal disease: Secondary | ICD-10-CM | POA: Diagnosis not present

## 2013-08-23 DIAGNOSIS — N2581 Secondary hyperparathyroidism of renal origin: Secondary | ICD-10-CM | POA: Diagnosis not present

## 2013-08-23 DIAGNOSIS — D472 Monoclonal gammopathy: Secondary | ICD-10-CM | POA: Diagnosis not present

## 2013-08-23 DIAGNOSIS — D631 Anemia in chronic kidney disease: Secondary | ICD-10-CM | POA: Diagnosis not present

## 2013-08-23 DIAGNOSIS — N186 End stage renal disease: Secondary | ICD-10-CM | POA: Diagnosis not present

## 2013-08-23 DIAGNOSIS — D509 Iron deficiency anemia, unspecified: Secondary | ICD-10-CM | POA: Diagnosis not present

## 2013-08-25 DIAGNOSIS — D631 Anemia in chronic kidney disease: Secondary | ICD-10-CM | POA: Diagnosis not present

## 2013-08-25 DIAGNOSIS — D509 Iron deficiency anemia, unspecified: Secondary | ICD-10-CM | POA: Diagnosis not present

## 2013-08-25 DIAGNOSIS — N186 End stage renal disease: Secondary | ICD-10-CM | POA: Diagnosis not present

## 2013-08-25 DIAGNOSIS — D472 Monoclonal gammopathy: Secondary | ICD-10-CM | POA: Diagnosis not present

## 2013-08-25 DIAGNOSIS — M25559 Pain in unspecified hip: Secondary | ICD-10-CM | POA: Diagnosis not present

## 2013-08-25 DIAGNOSIS — N2581 Secondary hyperparathyroidism of renal origin: Secondary | ICD-10-CM | POA: Diagnosis not present

## 2013-08-27 DIAGNOSIS — F331 Major depressive disorder, recurrent, moderate: Secondary | ICD-10-CM | POA: Diagnosis not present

## 2013-08-27 DIAGNOSIS — F411 Generalized anxiety disorder: Secondary | ICD-10-CM | POA: Diagnosis not present

## 2013-08-28 DIAGNOSIS — N186 End stage renal disease: Secondary | ICD-10-CM | POA: Diagnosis not present

## 2013-08-28 DIAGNOSIS — N2581 Secondary hyperparathyroidism of renal origin: Secondary | ICD-10-CM | POA: Diagnosis not present

## 2013-08-28 DIAGNOSIS — D472 Monoclonal gammopathy: Secondary | ICD-10-CM | POA: Diagnosis not present

## 2013-08-28 DIAGNOSIS — D631 Anemia in chronic kidney disease: Secondary | ICD-10-CM | POA: Diagnosis not present

## 2013-08-28 DIAGNOSIS — D509 Iron deficiency anemia, unspecified: Secondary | ICD-10-CM | POA: Diagnosis not present

## 2013-08-29 DIAGNOSIS — N186 End stage renal disease: Secondary | ICD-10-CM | POA: Diagnosis not present

## 2013-08-30 DIAGNOSIS — D509 Iron deficiency anemia, unspecified: Secondary | ICD-10-CM | POA: Diagnosis not present

## 2013-08-30 DIAGNOSIS — N186 End stage renal disease: Secondary | ICD-10-CM | POA: Diagnosis not present

## 2013-08-30 DIAGNOSIS — N039 Chronic nephritic syndrome with unspecified morphologic changes: Secondary | ICD-10-CM | POA: Diagnosis not present

## 2013-08-30 DIAGNOSIS — D631 Anemia in chronic kidney disease: Secondary | ICD-10-CM | POA: Diagnosis not present

## 2013-08-30 DIAGNOSIS — N2581 Secondary hyperparathyroidism of renal origin: Secondary | ICD-10-CM | POA: Diagnosis not present

## 2013-08-30 DIAGNOSIS — D472 Monoclonal gammopathy: Secondary | ICD-10-CM | POA: Diagnosis not present

## 2013-09-05 ENCOUNTER — Other Ambulatory Visit: Payer: Self-pay | Admitting: *Deleted

## 2013-09-05 MED ORDER — FENTANYL 12 MCG/HR TD PT72: 12.5000 ug | MEDICATED_PATCH | TRANSDERMAL | Status: DC

## 2013-09-05 MED ORDER — FENTANYL 25 MCG/HR TD PT72: 25.0000 ug | MEDICATED_PATCH | TRANSDERMAL | Status: DC

## 2013-09-06 DIAGNOSIS — N186 End stage renal disease: Secondary | ICD-10-CM | POA: Diagnosis not present

## 2013-09-07 ENCOUNTER — Other Ambulatory Visit: Payer: Self-pay | Admitting: *Deleted

## 2013-09-07 MED ORDER — LORAZEPAM 0.5 MG PO TABS: ORAL_TABLET | ORAL | Status: DC

## 2013-09-07 NOTE — Telephone Encounter (Signed)
[facility] 

## 2013-09-08 ENCOUNTER — Other Ambulatory Visit: Payer: Self-pay | Admitting: *Deleted

## 2013-09-08 MED ORDER — LORAZEPAM 0.5 MG PO TABS: ORAL_TABLET | ORAL | Status: DC

## 2013-09-08 NOTE — Telephone Encounter (Signed)
[facility] 

## 2013-09-11 ENCOUNTER — Non-Acute Institutional Stay (SKILLED_NURSING_FACILITY): Payer: Medicare Other | Admitting: Internal Medicine

## 2013-09-11 DIAGNOSIS — B20 Human immunodeficiency virus [HIV] disease: Secondary | ICD-10-CM

## 2013-09-11 DIAGNOSIS — N6489 Other specified disorders of breast: Secondary | ICD-10-CM | POA: Diagnosis not present

## 2013-09-11 DIAGNOSIS — R609 Edema, unspecified: Secondary | ICD-10-CM | POA: Diagnosis not present

## 2013-09-11 DIAGNOSIS — I272 Pulmonary hypertension, unspecified: Secondary | ICD-10-CM

## 2013-09-11 DIAGNOSIS — R600 Localized edema: Secondary | ICD-10-CM

## 2013-09-11 DIAGNOSIS — I2789 Other specified pulmonary heart diseases: Secondary | ICD-10-CM

## 2013-09-11 NOTE — Progress Notes (Signed)
Patient ID: [name], female   DOB: Nov 08, 1950, 63 y.o.   MRN: [id number] Facility; [facility] Chief complaint; swelling of her right arm, right breast and the right side of her face History; I was asked to see this patient by the nursing staff with regards to this issue. In fact they brought her back early from dialysis for me to look at this. Apparently the patient has had a swollen arm now for at least a month in fact I see that she had a duplex ultrasound of the right arm done by vascular surgery. This did not show evidence of superficial or deep venous thrombosis. Nevertheless the patient continues to note swelling of the right arm without any particular pain. The nursing staff of also noted swelling of the right breast. This is also not a new phenomenon. In fact she had an ultrasound of her right breast a year ago and a surgical consultation was recommended. She had an excisional biopsy of the right breast that did not show evidence of malignancy. I cannot see that she actually has had any followup of this issue. She is also noted by the nursing staff to have some right-sided facial swelling. This whole combination of issues had me somewhat concerned about a central venous thrombosis or external venous compression. I spent some time with the patient, reviewed her chart in [facility]  I also note that in 2010 the patient had an echocardiogram in followup of pulmonary hypertension. Her estimated PA systolic was 72 mmHg. She had moderate to severe right ventricular systolic dysfunction.  In the meantime she is followed with Dr. Johnnye Sima of infectious disease with regards to her HIV. Her last CD4 count was 312, HIV RNA less than 20. She remains on current therapy  Finally she has and more compliant with dialysis than earlier she generally feels better because of this.  Vacations Procrit 8000 units dialysis 3 times a week Lidoderm patch to lower back for 12 hours then off Cymbalta 60  mg daily +30 mg equal 90 mg total Wellbutrin 150 mg daily Lamivudine milligram tablet one half tablet daily Vitamin C 500 twice a day Protonic 40 mg twice a day Raltegravir 400 mg twice daily Lopressor 50 mg every 12 Sevelamer 800 2 caps =1600bid Cardizem 60mg  q 8h Retrovir 100 mg tid Duragesic 25 mg +12 equaled 37 mg every 3 days Nebs every 6 hours    review of systems Gen. she has not had any fevers or chills Respiratory no cough no sputum no hemoptysis and no chest pain Cardiac she has not had exertional chest discomfort Breasts; the patient states she has not noticed any major change in her chronically edematous right breast Extremities she is noting increasing swelling of the right arm but is not consistently noticed changes in her right face  Physical examination Gen. somewhat chronically ill looking woman who is not in any distress Respiratory clear entry bilaterally Cardiac soft midsystolic murmur that sounds benign she has a fixed split second sound. Her JVP is not elevated. Second sound is not particularly accentuated Breasts on the right she has lymphedema with peau d'orange. I do not feel a specific mass. There is no axillary lymphadenopathy no infarct or supraclavicular adenopathy and no adenopathy in the cervical chain on the right Extremities; indeed she has fairly marked swelling of the right arm right into the shoulder region. There is no tenderness however there is warmth. There is no axillary adenopathy is noted. She has a functional  tear of venous shunt in the distal right arm. This is also nontender  Impression/plan #1 swelling of the right arm with warmth+ no tenderness. Note duplex ultrasound being negative from the middle of June however I am going to go ahead and repeat this. If this is negative I think she is going to need a CT venogram to look at the central venous structures as well as the anatomy in her neck and thoracic outlet.   #2 swelling of the right  breast; this is not new. She has lymphedema in the right breast with clinical peau d'orange. I do not feel a mass. She had an excisional biopsy that was negative in March of 2014. Nevertheless I think a followup ultrasound of her right breast would be reassuring #3 facial edema; I really don't see this today nor can I get the patient to say that she notices this. Nevertheless this adds another worrisome feature to the history is specially with one above. This would make me concerned about central venous thromboembolic disease. She has not had a recent central venous catheter. #4 pulmonary hypertension listed is secondary although at this point I really don't know secondary to what. Don't see that she was followed by cardiology or pulmonary. There was nothing in her left heart to explain this. She did have moderately to severely reduced right ventricular function presumably secondary to pulmonary hypertension. Nevertheless these findings were from an echocardiogram in 2010. I am assuming that she is on sildenafil for this reason [safe assumption] #5 HIV. This is followed by the capable hands of Dr. Johnnye Sima   I am going to repeat the duplex ultrasound of her right arm. Failing this I am going to order a CT venogram to look at the central veins as well as the anatomy in the upper chest and neck. I am also going to ask for a repeat ultrasound of the right breast to compare with the one from early 2014. I am doing this even though an excisional biopsy done in March of 2014 was negative. I will try to research the issue of the pulmonary hypertension before embarking on further tests and investigations, this would be before I see her in followup next week

## 2013-09-12 DIAGNOSIS — R609 Edema, unspecified: Secondary | ICD-10-CM | POA: Diagnosis not present

## 2013-09-18 ENCOUNTER — Non-Acute Institutional Stay (SKILLED_NURSING_FACILITY): Payer: Medicare Other | Admitting: Internal Medicine

## 2013-09-18 DIAGNOSIS — R600 Localized edema: Secondary | ICD-10-CM

## 2013-09-18 DIAGNOSIS — N6489 Other specified disorders of breast: Secondary | ICD-10-CM | POA: Diagnosis not present

## 2013-09-18 DIAGNOSIS — R609 Edema, unspecified: Secondary | ICD-10-CM

## 2013-09-18 NOTE — Progress Notes (Signed)
Patient ID: [name], female   DOB: 10-31-1950, 63 y.o.   MRN: [id number]  Facility; [facility] Chief complaint; followup right arm swelling, right face swelling History; I have seen this patient in followup from a visit last week. The staff had noticed increasing swelling of her right arm up to the shoulder level. There is also been some concern about increasing facial swelling and swelling of her right breast. I noted that she had had swelling of the right breast before and had a excisional biopsy done by general surgery last year that was negative for malignancy. She also been seen by vascular surgery about a month ago and had a duplex ultrasound of her right arm that was negative for DVT. I saw her last week wasn't exactly impressed with the facial edema however the right arm was quite swollen but without pain. This is her shunt arm . I am seeing her today in followup of this.  In discussing things with the hall nurse who knows her quite well she is adamant that this patient had facial asymmetry although this appears to have improved. The right arm is still quite swollen but this is no worse than last week and I have repeated her duplex ultrasound which once again does not show evidence of a DVT although it does document high flow presumably due to her shunt. I also note that in 2010 the patient had an echocardiogram in followup of pulmonary hypertension.   Medications; Procrit 8000 units dialysis 3 times a week Lidoderm patch to lower back for 12 hours then off Cymbalta 60 mg daily +30 mg equal 90 mg total Wellbutrin 150 mg daily Lamivudine milligram tablet one half tablet daily Vitamin C 500 twice a day Protonic 40 mg twice a day Raltegravir 400 mg twice daily Lopressor 50 mg every 12 Sevelamer 800 2 caps =1600bid Cardizem 60mg  q 8h Retrovir 100 mg tid Duragesic 25 mg +12 equaled 37 mg every 3 days Nebs every 6 hours    review of systems Gen. she has not had any fevers  or chills Respiratory no cough no sputum no hemoptysis and no chest pain Cardiac she has not had exertional chest discomfort Breasts; the patient states she has not noticed any major change in her chronically edematous right breast Extremities she is noting increasing swelling of the right arm but is not consistently noticed changes in her right face  Physical examination Gen. somewhat chronically ill looking woman who is not in any distress Respiratory clear entry bilaterally Cardiac soft midsystolic murmur that sounds benign she has a fixed split second sound. Her JVP is not elevated. Second sound is not particularly accentuated Breasts on the right she has lymphedema with peau d'orange. I do not feel a specific mass. There is no axillary lymphadenopathy no infarct or supraclavicular adenopathy and no adenopathy in the cervical chain on the right Extremities; indeed she has fairly marked swelling of the right arm right into the shoulder region. There is no tenderness however there is warmth. There is no axillary adenopathy is noted. she has some superficial venous distention on her chest and perhaps some fullness in the right side of her neck   Impression/plan #1 swelling of the right arm with warmth+ no tenderness. her repeat duplex ultrasound of the right arm is negative however I feel that a CT venogram to exclude a central venous clot or venous external compression is indicated loaded this today #2 swelling of the right breast; this is not  new. She has lymphedema in the right breast with clinical peau d'orange. I do not feel a mass. She had an excisional biopsy that was negative in March of 2014. Nevertheless I think a followup ultrasound of her right breast would be useful. I have told her right breast swelling does not appear to be his that worries him a phenomenon.

## 2013-09-19 ENCOUNTER — Other Ambulatory Visit (HOSPITAL_BASED_OUTPATIENT_CLINIC_OR_DEPARTMENT_OTHER): Payer: Self-pay | Admitting: Internal Medicine

## 2013-09-19 DIAGNOSIS — N6489 Other specified disorders of breast: Secondary | ICD-10-CM

## 2013-09-19 DIAGNOSIS — I82621 Acute embolism and thrombosis of deep veins of right upper extremity: Secondary | ICD-10-CM

## 2013-09-21 ENCOUNTER — Ambulatory Visit (HOSPITAL_COMMUNITY): Payer: Medicare Other

## 2013-09-21 DIAGNOSIS — G894 Chronic pain syndrome: Secondary | ICD-10-CM | POA: Diagnosis not present

## 2013-09-21 DIAGNOSIS — D509 Iron deficiency anemia, unspecified: Secondary | ICD-10-CM | POA: Diagnosis not present

## 2013-09-24 DIAGNOSIS — F321 Major depressive disorder, single episode, moderate: Secondary | ICD-10-CM | POA: Diagnosis not present

## 2013-09-24 DIAGNOSIS — F411 Generalized anxiety disorder: Secondary | ICD-10-CM | POA: Diagnosis not present

## 2013-09-26 ENCOUNTER — Ambulatory Visit (HOSPITAL_COMMUNITY)
Admission: RE | Admit: 2013-09-26 | Discharge: 2013-09-26 | Disposition: A | Discharge status: Home / Self Care | Payer: Medicare Other | Source: Ambulatory Visit | Attending: Internal Medicine | Admitting: Internal Medicine

## 2013-09-26 ENCOUNTER — Encounter (HOSPITAL_COMMUNITY): Payer: Self-pay

## 2013-09-26 DIAGNOSIS — I871 Compression of vein: Secondary | ICD-10-CM | POA: Diagnosis not present

## 2013-09-26 DIAGNOSIS — M7989 Other specified soft tissue disorders: Secondary | ICD-10-CM | POA: Insufficient documentation

## 2013-09-26 DIAGNOSIS — R221 Localized swelling, mass and lump, neck: Principal | ICD-10-CM

## 2013-09-26 DIAGNOSIS — R220 Localized swelling, mass and lump, head: Secondary | ICD-10-CM | POA: Diagnosis not present

## 2013-09-26 DIAGNOSIS — I82621 Acute embolism and thrombosis of deep veins of right upper extremity: Secondary | ICD-10-CM

## 2013-09-26 IMAGING — CT CT ANGIO UP EXTREM RIGHT W/CM &/OR WO/CM
2 of 9 series · 16 of 36 positions shown · IV contrast (Iohexol (Omnipaque 350))
Comparison: [DATE]

CLINICAL DATA: Right arm common neck, and face in swelling. Right
arm dialysis shunt. Negative ultrasound per report.

EXAM:
CT ANGIOGRAPHY CHEST WITH CONTRAST
CTA RIGHT UPPER EXTREMITY WITH CONTRAST
TECHNIQUE: Multidetector CT imaging of the chest and right arm performed using
the standard protocol during bolus administration of intravenous
contrast. Multiplanar CT image reconstructions and MIPs were
obtained to evaluate the vascular anatomy.
CONTRAST:  100mL OMNIPAQUE IOHEXOL 350 MG/ML SOLN

[Series 407: thins pacs · axial · 0.98mm/px · z∈[+400,+890]mm · 15 of 561 slices shown]
[im 36/561  lung]
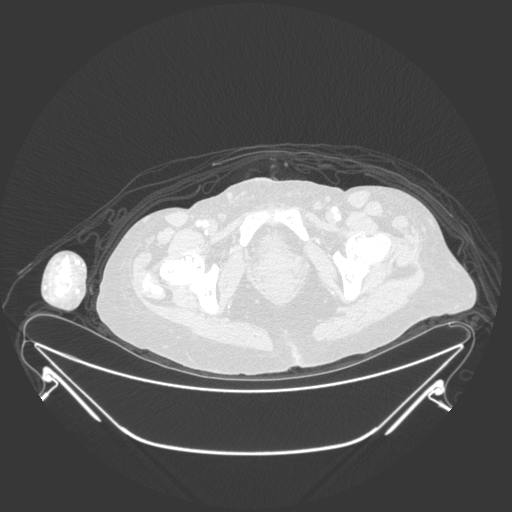
[im 71/561  mediastinal]
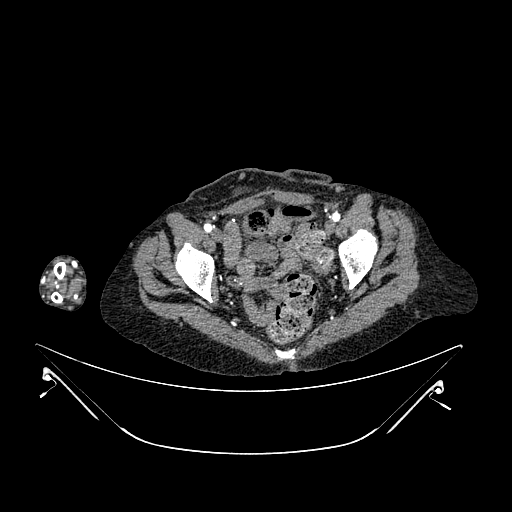
[im 106/561  lung]
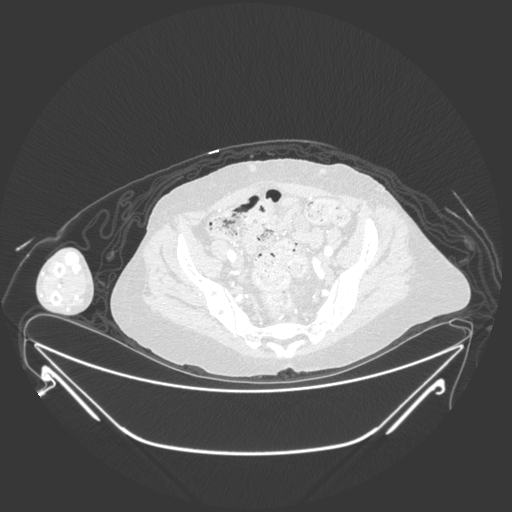
[im 141/561  mediastinal]
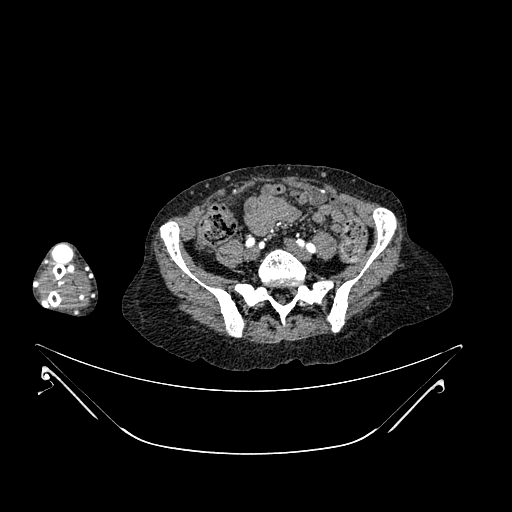
[im 176/561  lung]
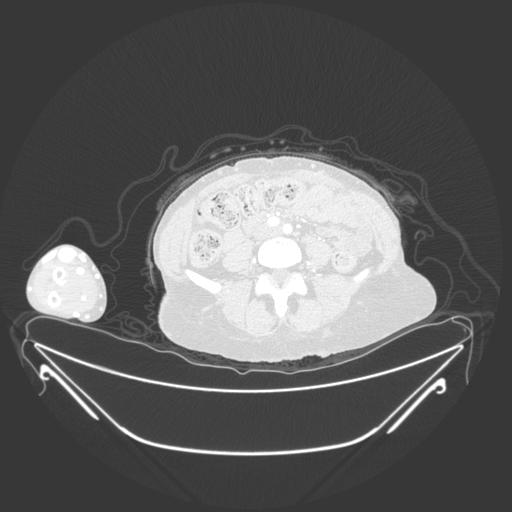
[im 211/561  mediastinal]
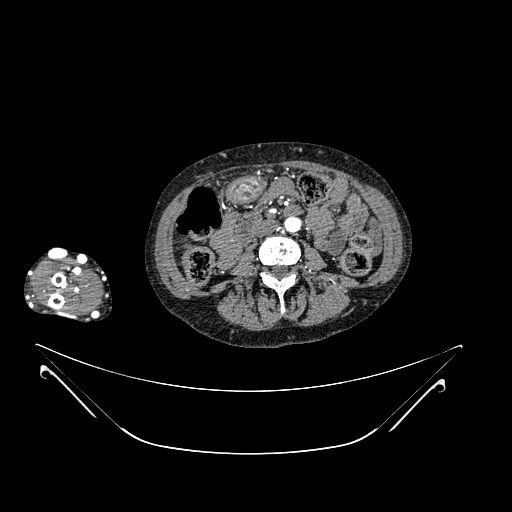
[im 246/561  lung]
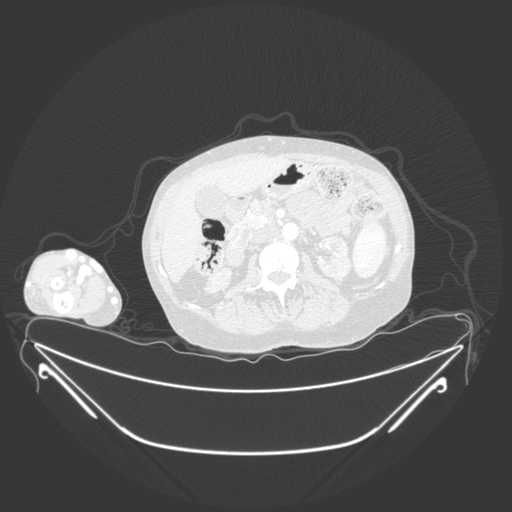
[im 281/561  mediastinal]
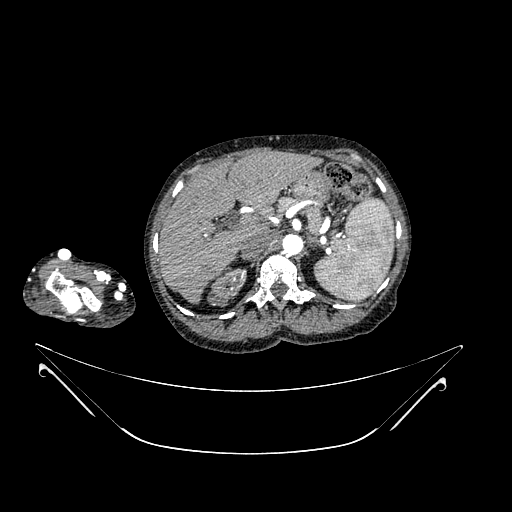
[im 316/561  lung]
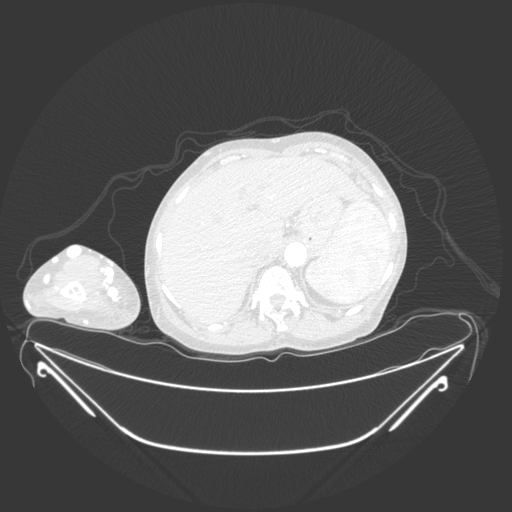
[im 351/561  mediastinal]
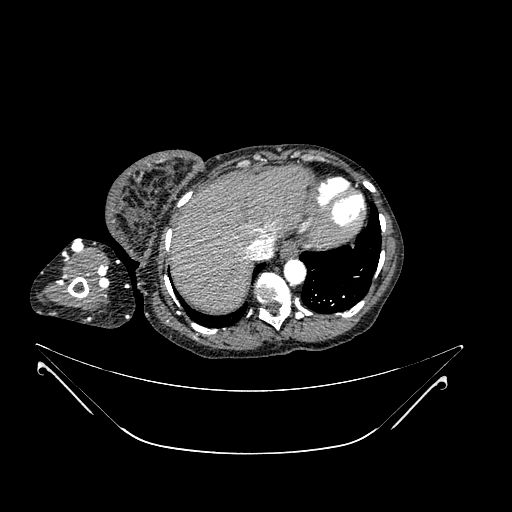
[im 386/561  lung]
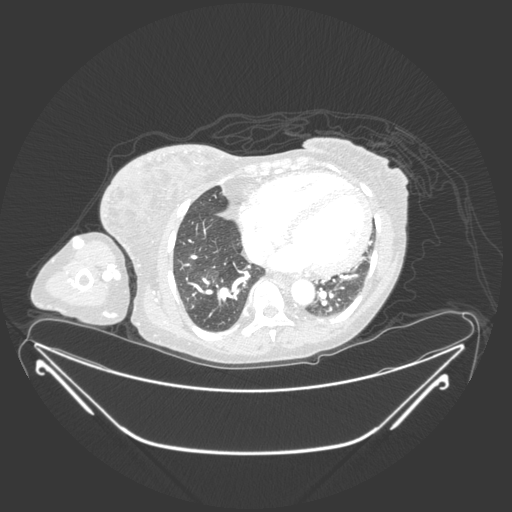
[im 421/561  mediastinal]
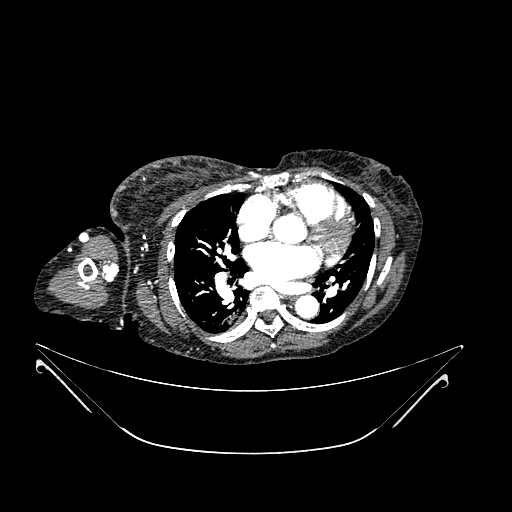
[im 456/561  lung]
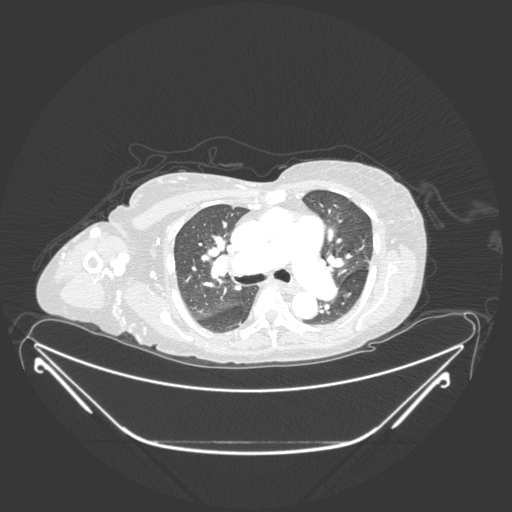
[im 491/561  mediastinal]
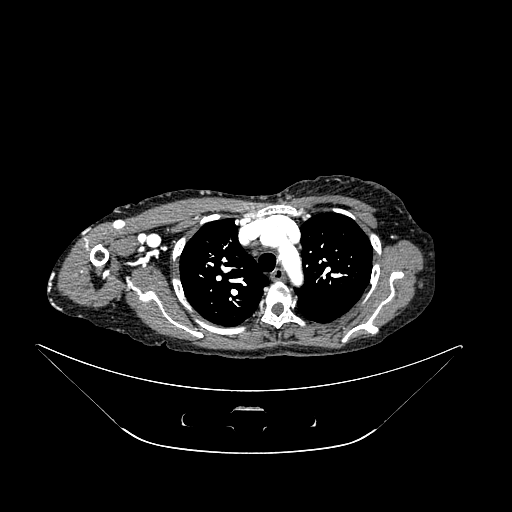
[im 526/561  lung]
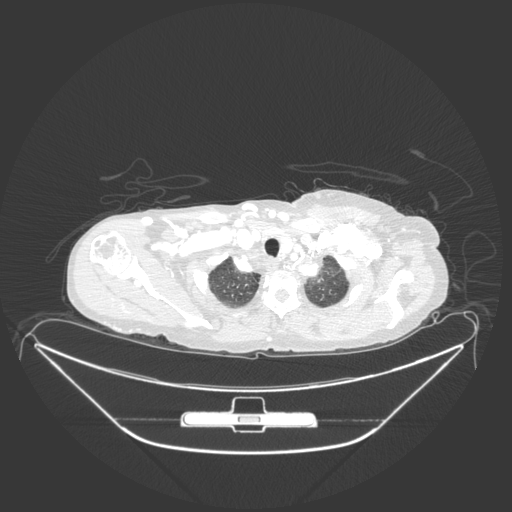

[Series 408: coronal mpr · coronal · 0.98mm/px · 1 of 96 slices shown]
[im 48/96  mediastinal]
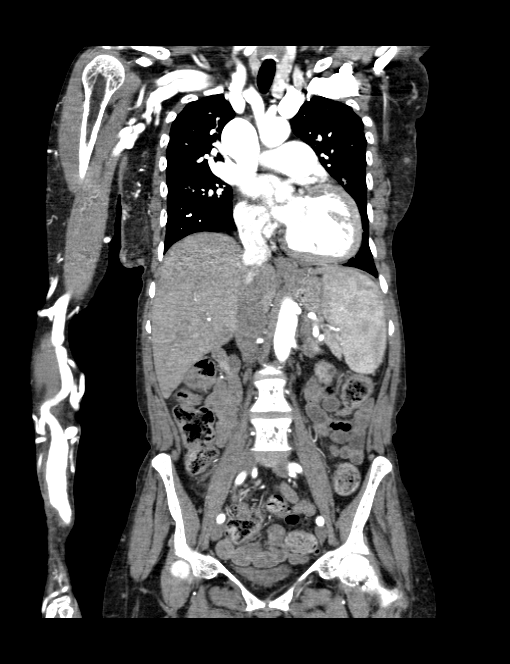

[16 of 36 positions shown; findings below may reference images not displayed]

FINDINGS: Patent radial wrist fistula with no evidence of right upper
extremity DVT. There is a web-like narrowing in the central right
subclavian vein just proximal to its confluence with the IJ vein.
There is a short segment stenosis of the right brachiocephalic vein
at its confluence with the SVC. There dilated chest wall collateral
vessels. Left innominate vein and SVC are widely patent.

Main pulmonary artery measures 3.3 cm diameter, right 2.7, left 2.7.
No filling defects are identified to suggest PE. Adequate contrast
opacification of the thoracic aorta with no evidence of dissection,
aneurysm, or stenosis. There is classic 3-vessel brachiocephalic
arch anatomy without proximal stenosis. There is patchy aortic and
coronary calcified plaque. No pleural or pericardial effusion.
Four-chamber cardiac enlargement. No hilar or mediastinal
adenopathy. Patchy ground-glass opacities posteriorly in both upper
lobes in and in the basilar segments left lower lobe.

There is has been interval development of progressive extensive
sclerosis throughout the axial skeleton with new punctate lucent
foci in scattered mid thoracic and lumbar vertebral bodies as well
as in the sternum.

Abdominal aorta is unremarkable. Small bilateral native kidneys with
small low-attenuation lesions, nonspecific. Unremarkable liver,
gallbladder, spleen, adrenal glands, pancreas. Stomach, small bowel,
and colon nondilated. No ascites. Urinary bladder is nondistended.
No free air. No adenopathy localized.

Review of the MIP images confirms the above findings.
IMPRESSION: 1. Tandem stenoses in the central right subclavian vein and right
brachiocephalic vein, of probable hemodynamic significance, probably
accounting for the right-sided symptomatology. Recommend
percutaneous venous angioplasty.
2. Dilated central pulmonary artery suggesting pulmonary arterial
hypertension.
3. Extensive osseous sclerosis with scattered lucent foci. This may
be a manifestation of renal osteodystrophy, although the lucent foci
can also be seen in the setting of multiple myeloma and metastatic
disease.

## 2013-09-26 MED ORDER — IOHEXOL 350 MG/ML SOLN
100.0000 mL | Freq: Once | INTRAVENOUS | Status: AC | PRN
  Administered 2013-09-26: 100 mL via INTRAVENOUS

## 2013-09-27 ENCOUNTER — Non-Acute Institutional Stay (SKILLED_NURSING_FACILITY): Payer: Medicare Other | Admitting: Internal Medicine

## 2013-09-27 DIAGNOSIS — I82B11 Acute embolism and thrombosis of right subclavian vein: Secondary | ICD-10-CM

## 2013-09-27 DIAGNOSIS — I2789 Other specified pulmonary heart diseases: Secondary | ICD-10-CM

## 2013-09-27 DIAGNOSIS — I82B19 Acute embolism and thrombosis of unspecified subclavian vein: Secondary | ICD-10-CM

## 2013-09-27 DIAGNOSIS — R609 Edema, unspecified: Secondary | ICD-10-CM

## 2013-09-27 DIAGNOSIS — R600 Localized edema: Secondary | ICD-10-CM

## 2013-09-27 DIAGNOSIS — I272 Pulmonary hypertension, unspecified: Secondary | ICD-10-CM

## 2013-09-27 NOTE — Progress Notes (Signed)
Patient ID: [name], female   DOB: [birth year], 63 y.o.   MRN: [id number] Facility; [facility] Chief complaint; followup right arm edema, right facial edema right-sided chest collaterals History; I have been following this patient for right arm edema for the last month. She has had now 2 duplex ultrasounds of the right arm, 1 ordered by Dr. Kellie Simmering of vascular surgery, and a second ordered by myself both negative for DVT in the arm. This is her shone arm which is present in the right lower extremity. The patient is not complaining of pain however roughly 3 weeks ago and was brought to my attention by the staff and the patient that she was experiencing right-sided facial swelling although by the time I saw her for this I could not really perceive this. History from the patient and the nursing staff was nevertheless that this degree of swelling seemed to come and go. There was also concern about collateral swelling on the chest and swelling of her right breast the low the right breast may actually be a separate issue was biopsied last year and was negative for malignancy. I was concerned about the possibility of a central venous thromboses and/or with a complex that he ever medical history possibly an extra venous obstruction. I therefore proceeded with a CT venogram. Results of this are listed below      CLINICAL DATA:  Right arm common neck, and face in swelling. Right arm dialysis shunt. Negative ultrasound per report.   EXAM: CT ANGIOGRAPHY CHEST WITH CONTRAST   CTA RIGHT UPPER EXTREMITY WITH CONTRAST   TECHNIQUE: Multidetector CT imaging of the chest and right arm performed using the standard protocol during bolus administration of intravenous contrast. Multiplanar CT image reconstructions and MIPs were obtained to evaluate the vascular anatomy.   CONTRAST:  142m OMNIPAQUE IOHEXOL 350 MG/ML SOLN   COMPARISON:  01/22/2009   FINDINGS: Patent radial wrist fistula with no  evidence of right upper extremity DVT. There is a web-like narrowing in the central right subclavian vein just proximal to its confluence with the IJ vein. There is a short segment stenosis of the right brachiocephalic vein at its confluence with the SVC. There dilated chest wall collateral vessels. Left innominate vein and SVC are widely patent.   Main pulmonary artery measures 3.3 cm diameter, right 2.7, left 2.7. No filling defects are identified to suggest PE. Adequate contrast opacification of the thoracic aorta with no evidence of dissection, aneurysm, or stenosis. There is classic 3-vessel brachiocephalic arch anatomy without proximal stenosis. There is patchy aortic and coronary calcified plaque. No pleural or pericardial effusion. Four-chamber cardiac enlargement. No hilar or mediastinal adenopathy. Patchy ground-glass opacities posteriorly in both upper lobes in and in the basilar segments left lower lobe.   There is has been interval development of progressive extensive sclerosis throughout the axial skeleton with new punctate lucent foci in scattered mid thoracic and lumbar vertebral bodies as well as in the sternum.   Abdominal aorta is unremarkable. Small bilateral native kidneys with small low-attenuation lesions, nonspecific. Unremarkable liver, gallbladder, spleen, adrenal glands, pancreas. Stomach, small bowel, and colon nondilated. No ascites. Urinary bladder is nondistended. No free air. No adenopathy localized.   Review of the MIP images confirms the above findings.   IMPRESSION: 1. 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 Recommend percutaneous venous angioplasty. 2. Dilated central pulmonary artery suggesting pulmonary arterial hypertension. 3.  Extensive osseous sclerosis with scattered lucent foci. This may be a  manifestation of renal osteodystrophy, although the lucent foci can also be seen in the setting of multiple myeloma and metastatic disease.     Electronically Signed   By: Arne Cleveland M.D.   On: 09/26/2013 10:41   Past Medical History  Diagnosis Date  . Pulmonary HTN   . Hypertension   . Depression   . Insomnia   . Chronic diarrhea   . HIV (human immunodeficiency virus infection)   . ESRD on hemodialysis   . C. difficile colitis 10/10/11  . Muscle weakness (generalized)   . Intracranial hemorrhage   . Anxiety   . Anemia    Past Surgical History  Procedure Laterality Date  . Dialysis shunts      previous one removed from left arm and now present one in right arm  . Esophagogastroduodenoscopy  12/15/2010    Procedure: ESOPHAGOGASTRODUODENOSCOPY (EGD);  Surgeon: Daneil Dolin, MD;  Location: [facility];  Service: Endoscopy;  Laterality: N/A;  . Tonsillectomy    . Biopsy thyroid    . Excision of breast biopsy Right 05/04/2012    Procedure: EXCISION OF BREAST BIOPSY;  Surgeon: Donato Heinz, MD;  Location: [facility];  Service: General;  Laterality: Right;  Right Excisional Breast Biopsy    Review of systems Respiratory; the patient does not complain of shortness of breath Cardiac no chest pain no exertional chest symptoms Right arm she really does not complain of pain. States the edema comes and goes   Physical examination Respiratory clear entry bilaterally Cardiac; soft midsystolic murmur, split second sound. No evidence congestive heart failure Right arm continued swelling up into the shoulder area but no pain and no warmth. Prominent superficial vessels on the chest. Again if there is facial asymmetry here I don't really see it. 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 I've referred this to vascular surgery for correlation with clinical  findings and possible angioplasty 2)Right breast enlargement; I've referred this back for a followup ultrasound. As noted a biopsy of the breast on 05/04/2012 was negative 3)Osseous sclerosis identified on the CT venogram; I don't know what to make of this if this does not osteodystrophy. Believe the answer on the multiple-choice question to findings like this would be lymphoma. Once again however she is asymptomatic however. I will need to followup on this #4 are hypertension identified on an echo last year. Once again I don't have a clear sense of this in terms of etiology.

## 2013-09-29 DIAGNOSIS — N186 End stage renal disease: Secondary | ICD-10-CM | POA: Diagnosis not present

## 2013-10-02 DIAGNOSIS — D509 Iron deficiency anemia, unspecified: Secondary | ICD-10-CM | POA: Diagnosis not present

## 2013-10-02 DIAGNOSIS — D631 Anemia in chronic kidney disease: Secondary | ICD-10-CM | POA: Diagnosis not present

## 2013-10-02 DIAGNOSIS — D472 Monoclonal gammopathy: Secondary | ICD-10-CM | POA: Diagnosis not present

## 2013-10-02 DIAGNOSIS — N186 End stage renal disease: Secondary | ICD-10-CM | POA: Diagnosis not present

## 2013-10-03 ENCOUNTER — Ambulatory Visit (HOSPITAL_COMMUNITY)
Admission: RE | Admit: 2013-10-03 | Discharge: 2013-10-03 | Disposition: A | Discharge status: Home / Self Care | Payer: Medicare Other | Source: Ambulatory Visit | Attending: Internal Medicine | Admitting: Internal Medicine

## 2013-10-03 DIAGNOSIS — R928 Other abnormal and inconclusive findings on diagnostic imaging of breast: Secondary | ICD-10-CM | POA: Diagnosis not present

## 2013-10-03 DIAGNOSIS — N6489 Other specified disorders of breast: Secondary | ICD-10-CM

## 2013-10-03 DIAGNOSIS — N63 Unspecified lump in unspecified breast: Secondary | ICD-10-CM | POA: Insufficient documentation

## 2013-10-03 DIAGNOSIS — Z992 Dependence on renal dialysis: Secondary | ICD-10-CM | POA: Insufficient documentation

## 2013-10-03 DIAGNOSIS — Z9889 Other specified postprocedural states: Secondary | ICD-10-CM | POA: Insufficient documentation

## 2013-10-03 IMAGING — MG MM DIGITAL DIAGNOSTIC BILAT CAD
4 series · 4 of 4 positions shown · non-contrast
Comparison: Prior exams including previous mammography as well as
upper extremity CT angiography [DATE].

CLINICAL DATA: Chronic right breast swelling. The patient has a
history of right-sided upper extremity dialysis fistula and also had
right axillary dissection for lymphadenopathy. History of HIV.

EXAM:
DIGITAL DIAGNOSTIC  bilateral MAMMOGRAM WITH CAD

[L CC]
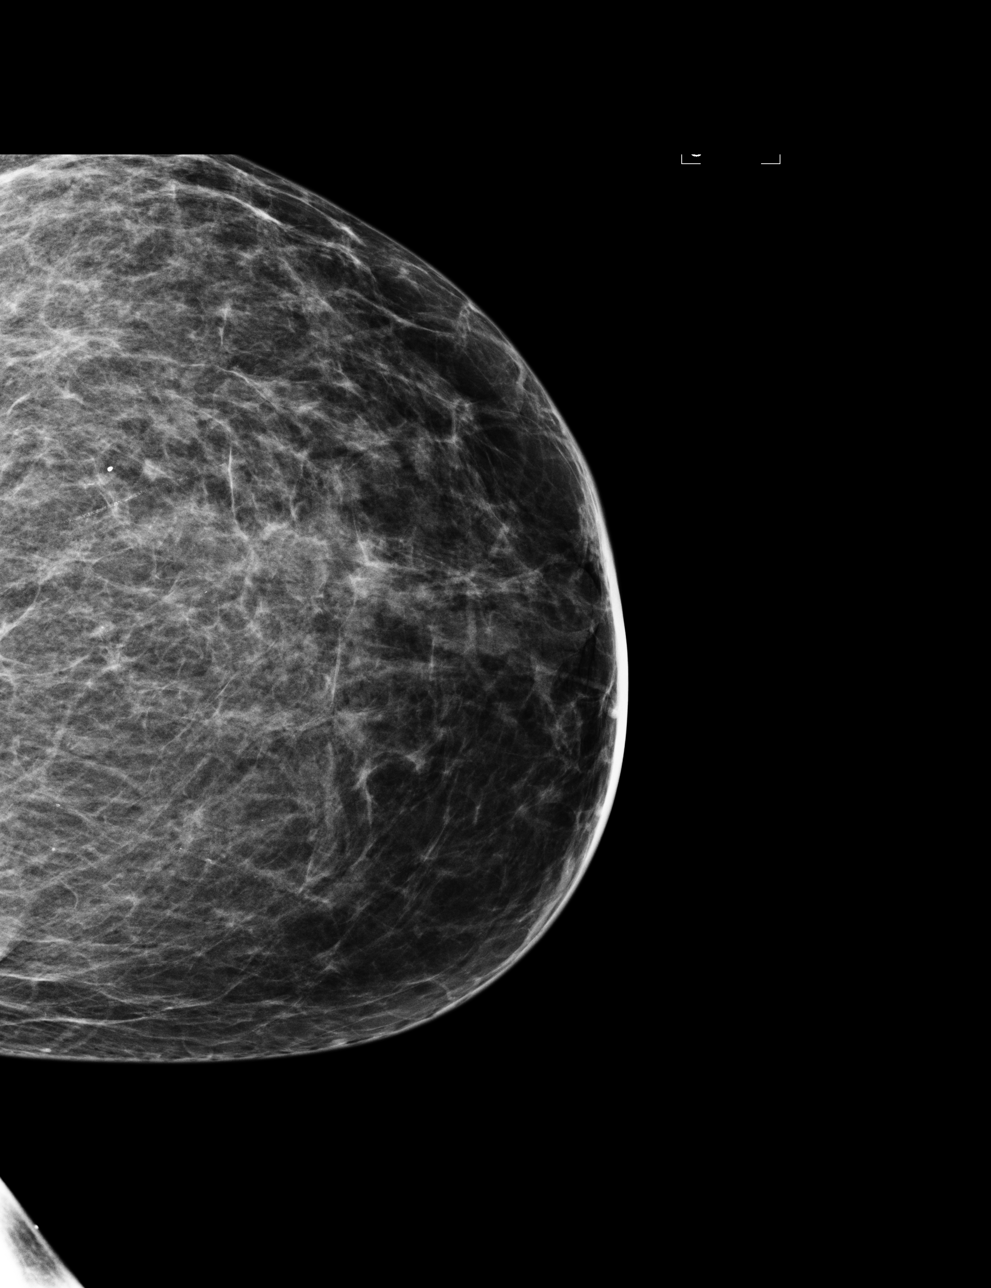

[L MLO]
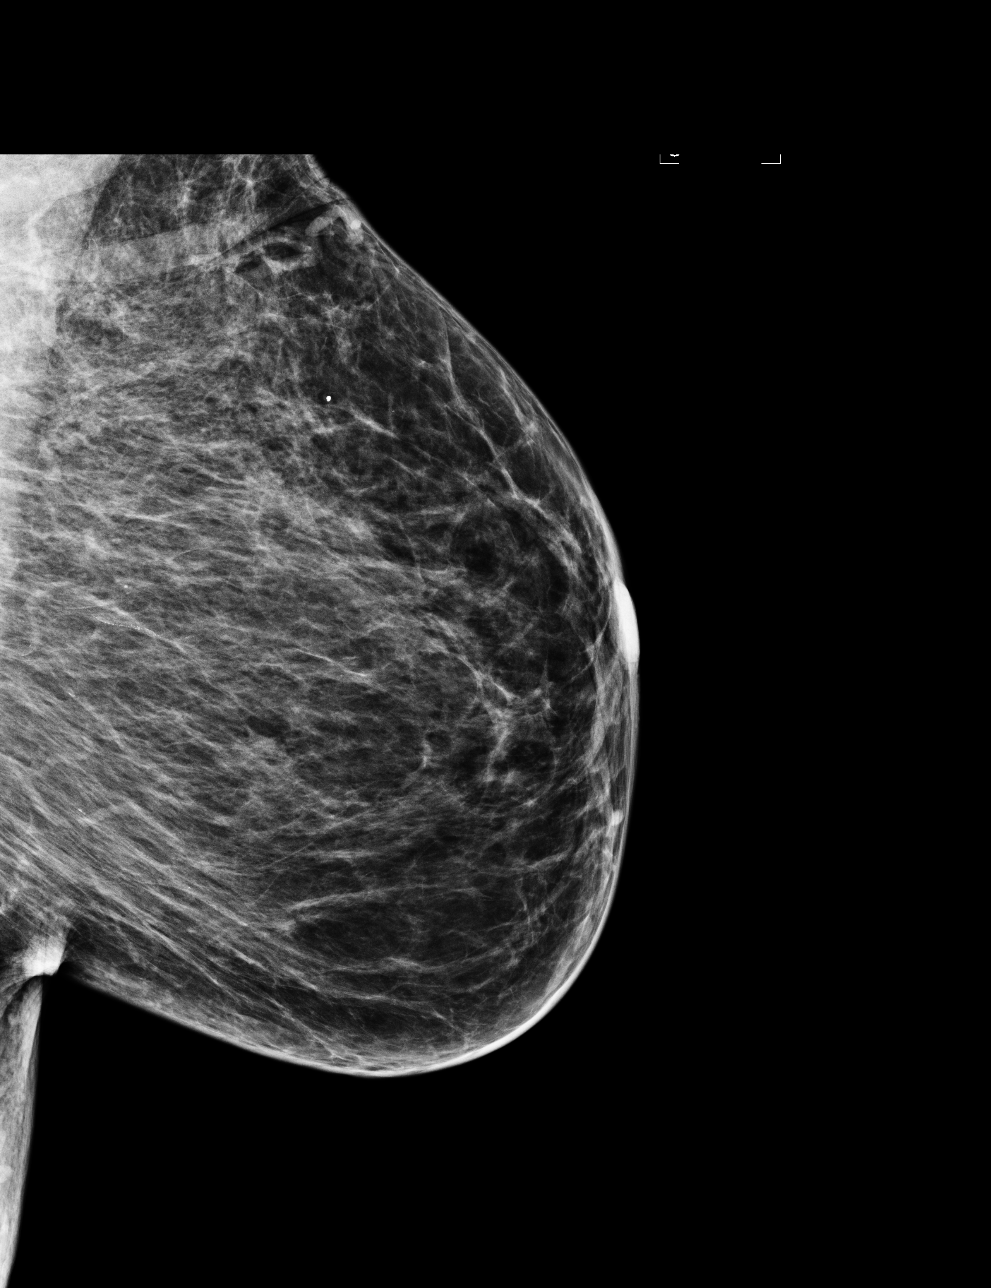

[R CC]
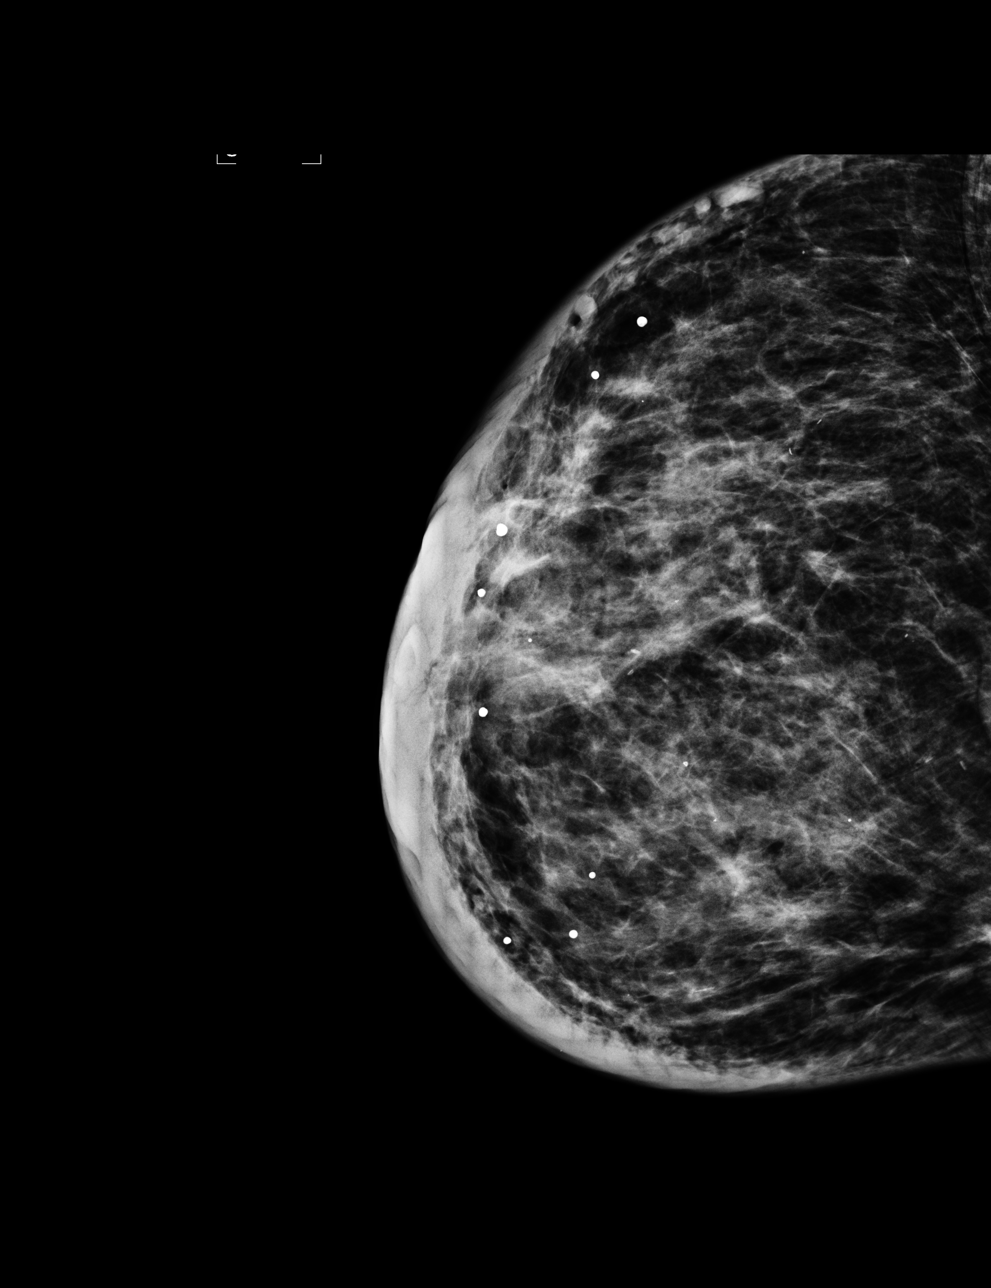

[R MLO]
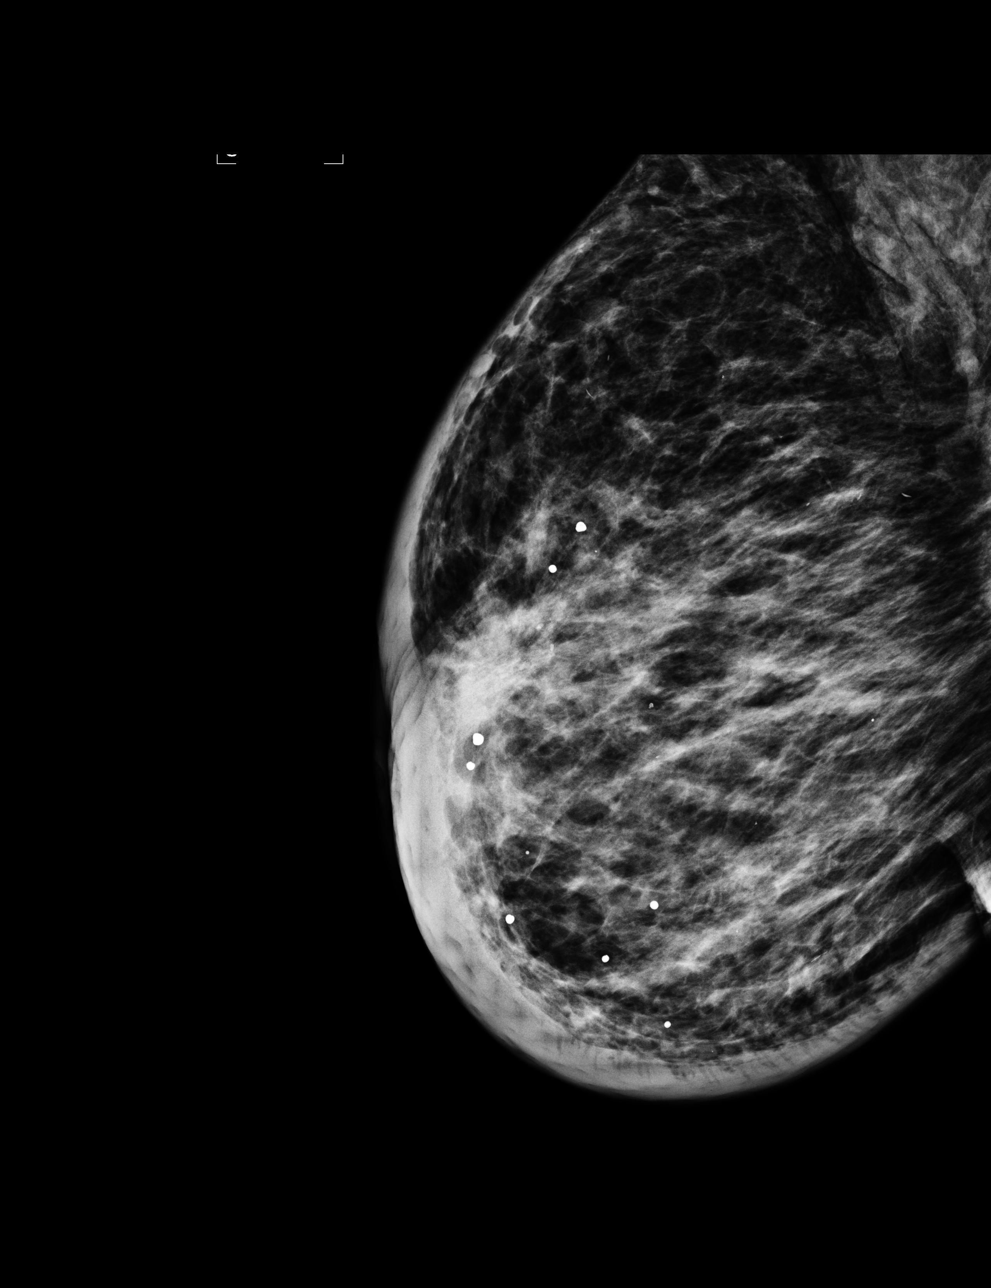

[4 of 4 positions shown; findings below may reference images not displayed]

ACR Breast Density Category c: The breast tissue is heterogeneously
dense, which may obscure small masses.
FINDINGS: There is diffuse bilateral right skin and trabecular edema. Right
axillary clips are noted. No new intramammary abnormality is
identified on either side.

Mammographic images were processed with CAD.
IMPRESSION: Right breast skin and trabecular edema, compatible with
hemodynamically significant central venous stenosis as detailed in
prior CT angiography report [DATE]. This could also be
exacerbated by a history of right axillary nodal dissection. As
previously discussed, consultation with interventional radiology is
recommended for consideration of possible percutaneous venous
angioplasty which may improve hemodynamic function and improve local
symptoms.

No mammographic evidence for malignancy in either breast.

RECOMMENDATION:
Screening mammogram in one year.(Code:[D6])

I have discussed the findings and recommendations with the patient.
Results were also provided in writing at the conclusion of the
visit. If applicable, a reminder letter will be sent to the patient
regarding the next appointment.

BI-RADS CATEGORY  2: Benign.

## 2013-10-04 DIAGNOSIS — N039 Chronic nephritic syndrome with unspecified morphologic changes: Secondary | ICD-10-CM | POA: Diagnosis not present

## 2013-10-04 DIAGNOSIS — D472 Monoclonal gammopathy: Secondary | ICD-10-CM | POA: Diagnosis not present

## 2013-10-04 DIAGNOSIS — D631 Anemia in chronic kidney disease: Secondary | ICD-10-CM | POA: Diagnosis not present

## 2013-10-04 DIAGNOSIS — N186 End stage renal disease: Secondary | ICD-10-CM | POA: Diagnosis not present

## 2013-10-04 DIAGNOSIS — D509 Iron deficiency anemia, unspecified: Secondary | ICD-10-CM | POA: Diagnosis not present

## 2013-10-06 DIAGNOSIS — D509 Iron deficiency anemia, unspecified: Secondary | ICD-10-CM | POA: Diagnosis not present

## 2013-10-06 DIAGNOSIS — D472 Monoclonal gammopathy: Secondary | ICD-10-CM | POA: Diagnosis not present

## 2013-10-06 DIAGNOSIS — N186 End stage renal disease: Secondary | ICD-10-CM | POA: Diagnosis not present

## 2013-10-06 DIAGNOSIS — N039 Chronic nephritic syndrome with unspecified morphologic changes: Secondary | ICD-10-CM | POA: Diagnosis not present

## 2013-10-06 DIAGNOSIS — D631 Anemia in chronic kidney disease: Secondary | ICD-10-CM | POA: Diagnosis not present

## 2013-10-09 ENCOUNTER — Encounter: Payer: Self-pay | Admitting: Vascular Surgery

## 2013-10-09 DIAGNOSIS — N039 Chronic nephritic syndrome with unspecified morphologic changes: Secondary | ICD-10-CM | POA: Diagnosis not present

## 2013-10-09 DIAGNOSIS — D509 Iron deficiency anemia, unspecified: Secondary | ICD-10-CM | POA: Diagnosis not present

## 2013-10-09 DIAGNOSIS — D472 Monoclonal gammopathy: Secondary | ICD-10-CM | POA: Diagnosis not present

## 2013-10-09 DIAGNOSIS — N186 End stage renal disease: Secondary | ICD-10-CM | POA: Diagnosis not present

## 2013-10-09 DIAGNOSIS — D631 Anemia in chronic kidney disease: Secondary | ICD-10-CM | POA: Diagnosis not present

## 2013-10-10 ENCOUNTER — Ambulatory Visit (INDEPENDENT_AMBULATORY_CARE_PROVIDER_SITE_OTHER): Payer: Medicare Other | Admitting: Vascular Surgery

## 2013-10-10 ENCOUNTER — Encounter: Payer: Self-pay | Admitting: Vascular Surgery

## 2013-10-10 VITALS — BP 179/115 | HR 56 | Resp 16 | Ht 62.0 in | Wt 108.9 lb

## 2013-10-10 DIAGNOSIS — M7989 Other specified soft tissue disorders: Secondary | ICD-10-CM | POA: Insufficient documentation

## 2013-10-10 NOTE — Progress Notes (Signed)
Subjective:     Patient ID: [name], female   DOB: [birth year], 63 y.o.   MRN: [id number]  HPI this 4-year-old female was referred for venous hypertension the right upper extremity. She has on dialysis on Monday Wednesday and Friday using a right arm AV fistula which has been present for several years. She has developed right upper extremity edema for the past 6 weeks as well as edema in the upper chest wall and facial area and neck on the right. She had a CT angiogram performed on 09/27/2013 which revealed the nurses of the right subclavian vein and right brachiocephalic vein.  Past Medical History  Diagnosis Date  . Pulmonary HTN   . Hypertension   . Depression   . Insomnia   . Chronic diarrhea   . HIV (human immunodeficiency virus infection)   . ESRD on hemodialysis   . C. difficile colitis 10/10/11  . Muscle weakness (generalized)   . Intracranial hemorrhage   . Anxiety   . Anemia     History  Substance Use Topics  . Smoking status: Former Research scientist (life sciences)  . Smokeless tobacco: Never Used  . Alcohol Use: No    Family History  Problem Relation Age of Onset  . Anesthesia problems Neg Hx   . Hypotension Neg Hx   . Malignant hyperthermia Neg Hx   . Pseudochol deficiency Neg Hx     Allergies  Allergen Reactions  . Penicillins     REACTION: rash  . Latex Rash    Current outpatient prescriptions:buPROPion (WELLBUTRIN XL) 150 MG 24 hr tablet, Take 150 mg by mouth daily., Disp: , Rfl: ;  diltiazem (CARDIZEM) 60 MG tablet, Take 60 mg by mouth every 8 (eight) hours., Disp: , Rfl: ;  DULoxetine (CYMBALTA) 30 MG capsule, Take 30 mg by mouth daily. Take with 60mg  capsule for total of 90 mg., Disp: , Rfl:  DULoxetine (CYMBALTA) 60 MG capsule, Take 60 mg by mouth daily. Take with 30mg  capsule for a total of 90mg , Disp: , Rfl: ;  epoetin alfa (EPOGEN,PROCRIT) 29562 UNIT/ML injection, Inject 8,000 Units into the skin 3 (three) times a week. To be given at dialysis, Monday Wednesday and  Friday, Disp: , Rfl:  fentaNYL (DURAGESIC - DOSED MCG/HR) 12 MCG/HR, Place 1 patch (12.5 mcg total) onto the skin every 3 (three) days. Apply with 75mcg patch for a total of 37.5 mcg, Disp: 5 patch, Rfl: 0;  fentaNYL (DURAGESIC - DOSED MCG/HR) 25 MCG/HR patch, Place 1 patch (25 mcg total) onto the skin every 3 (three) days. Apply with 12.5 mcg patch for a total of 37.31mcg, Disp: 5 patch, Rfl: 0 hydroxypropyl methylcellulose (ISOPTO TEARS) 2.5 % ophthalmic solution, Place 1 drop into both eyes 2 (two) times daily.  , Disp: , Rfl: ;  ipratropium-albuterol (DUONEB) 0.5-2.5 (3) MG/3ML SOLN, Take 3 mLs by nebulization every 6 (six) hours as needed. For shortness of breath/ wheezing, Disp: , Rfl: ;  lamivudine (EPIVIR) 100 MG tablet, Take 0.5 tablets (50 mg total) by mouth daily. Take one half tablet daily, Disp: 150 tablet, Rfl: 90 lidocaine (LIDODERM) 5 %, Place 1 patch onto the skin daily. Remove & Discard patch within 12 hours or as directed by MD , Disp: , Rfl: ;  lidocaine-prilocaine (EMLA) cream, Apply 1 application topically 3 (three) times a week. Apply 1 hour prior to dialysis access site on Monday Wednesday and Friday, Disp: , Rfl: ;  LORazepam (ATIVAN) 0.5 MG tablet, Take 1/2 tablet by mouth every 6  hours as needed for anxiety, Disp: 60 tablet, Rfl: 5 metoprolol (LOPRESSOR) 50 MG tablet, Take 50 mg by mouth 2 (two) times daily., Disp: , Rfl: ;  multivitamin (RENA-VIT) TABS tablet, Take 1 tablet by mouth at bedtime., Disp: , Rfl: ;  ondansetron (ZOFRAN) 4 MG tablet, Take 2 mg by mouth every 4 (four) hours. nausea, Disp: , Rfl:  oxyCODONE (OXY IR/ROXICODONE) 5 MG immediate release tablet, Take 5 mg by mouth See admin instructions. Take 1 tablet at bedtime and every 6 hours as needed for pain, Disp: , Rfl: ;  pantoprazole (PROTONIX) 40 MG tablet, Take 40 mg by mouth 2 (two) times daily., Disp: , Rfl: ;  raltegravir (ISENTRESS) 400 MG tablet, Take 400 mg by mouth 2 (two) times daily. , Disp: , Rfl:   sevelamer carbonate (RENVELA) 800 MG tablet, Take 1,600-3,200 mg by mouth 5 (five) times daily. Take 4 tablets with meals and 2 tablets with snacks, Disp: , Rfl: ;  sildenafil (REVATIO) 20 MG tablet, Take 20 mg by mouth 3 (three) times daily. , Disp: , Rfl: ;  vitamin C (ASCORBIC ACID) 500 MG tablet, Take 500 mg by mouth 2 (two) times daily., Disp: , Rfl:  zidovudine (RETROVIR) 100 MG capsule, Take 100 mg by mouth 3 (three) times daily.  , Disp: , Rfl: ;  zolpidem (AMBIEN) 5 MG tablet, Take 5 mg by mouth at bedtime as needed for sleep., Disp: , Rfl:   BP 179/115  Pulse 56  Resp 16  Ht 5\' 2"  (1.575 m)  Wt 108 lb 14.4 oz (49.397 kg)  BMI 19.91 kg/m2  Body mass index is 19.91 kg/(m^2).          Review of Systems patient has end-stage renal disease and a history of pulmonary hypertension.    Objective:   Physical Exam BP 179/115  Pulse 56  Resp 16  Ht 5\' 2"  (1.575 m)  Wt 108 lb 14.4 oz (49.397 kg)  BMI 19.91 kg/m2  Gen. elderly female in wheelchair-in no apparent stress Lungs no rhonchi or wheezing Upper chest wall on the right has dilated subcutaneous veins. Mild swelling right side of neck. AV fistula right forearm with multiple dilated veins. Right hand well perfused. 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 Adele Barthel on Thursday, August 20

## 2013-10-11 DIAGNOSIS — D472 Monoclonal gammopathy: Secondary | ICD-10-CM | POA: Diagnosis not present

## 2013-10-11 DIAGNOSIS — N186 End stage renal disease: Secondary | ICD-10-CM | POA: Diagnosis not present

## 2013-10-11 DIAGNOSIS — D631 Anemia in chronic kidney disease: Secondary | ICD-10-CM | POA: Diagnosis not present

## 2013-10-11 DIAGNOSIS — D509 Iron deficiency anemia, unspecified: Secondary | ICD-10-CM | POA: Diagnosis not present

## 2013-10-13 ENCOUNTER — Other Ambulatory Visit: Payer: Self-pay | Admitting: *Deleted

## 2013-10-13 DIAGNOSIS — N039 Chronic nephritic syndrome with unspecified morphologic changes: Secondary | ICD-10-CM | POA: Diagnosis not present

## 2013-10-13 DIAGNOSIS — D509 Iron deficiency anemia, unspecified: Secondary | ICD-10-CM | POA: Diagnosis not present

## 2013-10-13 DIAGNOSIS — D472 Monoclonal gammopathy: Secondary | ICD-10-CM | POA: Diagnosis not present

## 2013-10-13 DIAGNOSIS — D631 Anemia in chronic kidney disease: Secondary | ICD-10-CM | POA: Diagnosis not present

## 2013-10-13 DIAGNOSIS — N186 End stage renal disease: Secondary | ICD-10-CM | POA: Diagnosis not present

## 2013-10-16 DIAGNOSIS — D509 Iron deficiency anemia, unspecified: Secondary | ICD-10-CM | POA: Diagnosis not present

## 2013-10-16 DIAGNOSIS — D631 Anemia in chronic kidney disease: Secondary | ICD-10-CM | POA: Diagnosis not present

## 2013-10-16 DIAGNOSIS — N186 End stage renal disease: Secondary | ICD-10-CM | POA: Diagnosis not present

## 2013-10-16 DIAGNOSIS — D472 Monoclonal gammopathy: Secondary | ICD-10-CM | POA: Diagnosis not present

## 2013-10-18 DIAGNOSIS — D509 Iron deficiency anemia, unspecified: Secondary | ICD-10-CM | POA: Diagnosis not present

## 2013-10-18 DIAGNOSIS — N186 End stage renal disease: Secondary | ICD-10-CM | POA: Diagnosis not present

## 2013-10-18 DIAGNOSIS — D631 Anemia in chronic kidney disease: Secondary | ICD-10-CM | POA: Diagnosis not present

## 2013-10-18 DIAGNOSIS — D472 Monoclonal gammopathy: Secondary | ICD-10-CM | POA: Diagnosis not present

## 2013-10-18 MED ORDER — SODIUM CHLORIDE 0.9 % IV SOLN: 250.0000 mL | INTRAVENOUS | Status: DC | PRN

## 2013-10-18 MED ORDER — SODIUM CHLORIDE 0.9 % IJ SOLN: 3.0000 mL | Freq: Two times a day (BID) | INTRAMUSCULAR | Status: DC

## 2013-10-18 MED ORDER — SODIUM CHLORIDE 0.9 % IJ SOLN: 3.0000 mL | INTRAMUSCULAR | Status: DC | PRN

## 2013-10-19 ENCOUNTER — Ambulatory Visit (HOSPITAL_COMMUNITY)
Admission: RE | Admit: 2013-10-19 | Discharge: 2013-10-19 | Disposition: A | Discharge status: Home / Self Care | Payer: Medicare Other | Source: Ambulatory Visit | Attending: Vascular Surgery | Admitting: Vascular Surgery

## 2013-10-19 ENCOUNTER — Encounter (HOSPITAL_COMMUNITY)
Admission: RE | Disposition: A | Discharge status: Home / Self Care | Payer: Self-pay | Source: Ambulatory Visit | Attending: Vascular Surgery

## 2013-10-19 DIAGNOSIS — G4700 Insomnia, unspecified: Secondary | ICD-10-CM | POA: Diagnosis not present

## 2013-10-19 DIAGNOSIS — G894 Chronic pain syndrome: Secondary | ICD-10-CM | POA: Diagnosis not present

## 2013-10-19 DIAGNOSIS — D649 Anemia, unspecified: Secondary | ICD-10-CM | POA: Diagnosis not present

## 2013-10-19 DIAGNOSIS — M6281 Muscle weakness (generalized): Secondary | ICD-10-CM | POA: Insufficient documentation

## 2013-10-19 DIAGNOSIS — Y832 Surgical operation with anastomosis, bypass or graft as the cause of abnormal reaction of the patient, or of later complication, without mention of misadventure at the time of the procedure: Secondary | ICD-10-CM | POA: Diagnosis not present

## 2013-10-19 DIAGNOSIS — B20 Human immunodeficiency virus [HIV] disease: Secondary | ICD-10-CM | POA: Insufficient documentation

## 2013-10-19 DIAGNOSIS — F3289 Other specified depressive episodes: Secondary | ICD-10-CM | POA: Insufficient documentation

## 2013-10-19 DIAGNOSIS — I871 Compression of vein: Secondary | ICD-10-CM | POA: Insufficient documentation

## 2013-10-19 DIAGNOSIS — I87309 Chronic venous hypertension (idiopathic) without complications of unspecified lower extremity: Secondary | ICD-10-CM | POA: Insufficient documentation

## 2013-10-19 DIAGNOSIS — N186 End stage renal disease: Secondary | ICD-10-CM | POA: Diagnosis not present

## 2013-10-19 DIAGNOSIS — T82598A Other mechanical complication of other cardiac and vascular devices and implants, initial encounter: Secondary | ICD-10-CM | POA: Diagnosis not present

## 2013-10-19 DIAGNOSIS — T82898A Other specified complication of vascular prosthetic devices, implants and grafts, initial encounter: Secondary | ICD-10-CM

## 2013-10-19 DIAGNOSIS — Z87891 Personal history of nicotine dependence: Secondary | ICD-10-CM | POA: Insufficient documentation

## 2013-10-19 DIAGNOSIS — Z79899 Other long term (current) drug therapy: Secondary | ICD-10-CM | POA: Insufficient documentation

## 2013-10-19 DIAGNOSIS — I120 Hypertensive chronic kidney disease with stage 5 chronic kidney disease or end stage renal disease: Secondary | ICD-10-CM | POA: Insufficient documentation

## 2013-10-19 DIAGNOSIS — F329 Major depressive disorder, single episode, unspecified: Secondary | ICD-10-CM | POA: Diagnosis not present

## 2013-10-19 DIAGNOSIS — Z992 Dependence on renal dialysis: Secondary | ICD-10-CM | POA: Diagnosis not present

## 2013-10-19 DIAGNOSIS — F411 Generalized anxiety disorder: Secondary | ICD-10-CM | POA: Diagnosis not present

## 2013-10-19 HISTORY — PX: SHUNTOGRAM: SHX5491

## 2013-10-19 LAB — POCT I-STAT, CHEM 8
BUN: 54 mg/dL — AB (ref 6–23)
CREATININE: 8 mg/dL — AB (ref 0.50–1.10)
Calcium, Ion: 1.24 mmol/L (ref 1.13–1.30)
Chloride: 102 mEq/L (ref 96–112)
GLUCOSE: 75 mg/dL (ref 70–99)
HCT: 46 % (ref 36.0–46.0)
Hemoglobin: 15.6 g/dL — ABNORMAL HIGH (ref 12.0–15.0)
Potassium: 5.7 mEq/L — ABNORMAL HIGH (ref 3.7–5.3)
SODIUM: 138 meq/L (ref 137–147)
TCO2: 30 mmol/L (ref 0–100)

## 2013-10-19 LAB — POTASSIUM: Potassium: 4.7 mEq/L (ref 3.7–5.3)

## 2013-10-19 SURGERY — ASSESSMENT, SHUNT FUNCTION, WITH CONTRAST RADIOGRAPHIC STUDY
Anesthesia: LOCAL | Laterality: Right

## 2013-10-19 MED ORDER — ACETAMINOPHEN 325 MG PO TABS: 650.0000 mg | ORAL_TABLET | ORAL | Status: DC | PRN

## 2013-10-19 MED ORDER — HEPARIN (PORCINE) IN NACL 2-0.9 UNIT/ML-% IJ SOLN
INTRAMUSCULAR | Status: AC
  Filled 2013-10-19: qty 500

## 2013-10-19 MED ORDER — SODIUM CHLORIDE 0.9 % IV SOLN: 250.0000 mL | INTRAVENOUS | Status: DC | PRN

## 2013-10-19 MED ORDER — SODIUM CHLORIDE 0.9 % IJ SOLN: 3.0000 mL | INTRAMUSCULAR | Status: DC | PRN

## 2013-10-19 MED ORDER — LIDOCAINE HCL (PF) 1 % IJ SOLN
INTRAMUSCULAR | Status: AC
  Filled 2013-10-19: qty 30

## 2013-10-19 MED ORDER — HEPARIN SODIUM (PORCINE) 1000 UNIT/ML IJ SOLN
INTRAMUSCULAR | Status: AC
  Filled 2013-10-19: qty 1

## 2013-10-19 MED ORDER — SODIUM CHLORIDE 0.9 % IJ SOLN: 3.0000 mL | Freq: Two times a day (BID) | INTRAMUSCULAR | Status: DC

## 2013-10-19 NOTE — Progress Notes (Signed)
Tolerated POS well. VSS  Report called to [facility] (pts nurse ) at [facility]

## 2013-10-19 NOTE — H&P (View-Only) (Signed)
Subjective:     Patient ID: [name], female   DOB: 1950/03/17, 63 y.o.   MRN: [id number]  HPI this 17-year-old female was referred for venous hypertension the right upper extremity. She has on dialysis on Monday Wednesday and Friday using a right arm AV fistula which has been present for several years. She has developed right upper extremity edema for the past 6 weeks as well as edema in the upper chest wall and facial area and neck on the right. She had a CT angiogram performed on 09/27/2013 which revealed the nurses of the right subclavian vein and right brachiocephalic vein.  Past Medical History  Diagnosis Date  . Pulmonary HTN   . Hypertension   . Depression   . Insomnia   . Chronic diarrhea   . HIV (human immunodeficiency virus infection)   . ESRD on hemodialysis   . C. difficile colitis 10/10/11  . Muscle weakness (generalized)   . Intracranial hemorrhage   . Anxiety   . Anemia     History  Substance Use Topics  . Smoking status: Former Research scientist (life sciences)  . Smokeless tobacco: Never Used  . Alcohol Use: No    Family History  Problem Relation Age of Onset  . Anesthesia problems Neg Hx   . Hypotension Neg Hx   . Malignant hyperthermia Neg Hx   . Pseudochol deficiency Neg Hx     Allergies  Allergen Reactions  . Penicillins     REACTION: rash  . Latex Rash    Current outpatient prescriptions:buPROPion (WELLBUTRIN XL) 150 MG 24 hr tablet, Take 150 mg by mouth daily., Disp: , Rfl: ;  diltiazem (CARDIZEM) 60 MG tablet, Take 60 mg by mouth every 8 (eight) hours., Disp: , Rfl: ;  DULoxetine (CYMBALTA) 30 MG capsule, Take 30 mg by mouth daily. Take with 60mg  capsule for total of 90 mg., Disp: , Rfl:  DULoxetine (CYMBALTA) 60 MG capsule, Take 60 mg by mouth daily. Take with 30mg  capsule for a total of 90mg , Disp: , Rfl: ;  epoetin alfa (EPOGEN,PROCRIT) 57846 UNIT/ML injection, Inject 8,000 Units into the skin 3 (three) times a week. To be given at dialysis, Monday Wednesday and  Friday, Disp: , Rfl:  fentaNYL (DURAGESIC - DOSED MCG/HR) 12 MCG/HR, Place 1 patch (12.5 mcg total) onto the skin every 3 (three) days. Apply with 48mcg patch for a total of 37.5 mcg, Disp: 5 patch, Rfl: 0;  fentaNYL (DURAGESIC - DOSED MCG/HR) 25 MCG/HR patch, Place 1 patch (25 mcg total) onto the skin every 3 (three) days. Apply with 12.5 mcg patch for a total of 37.75mcg, Disp: 5 patch, Rfl: 0 hydroxypropyl methylcellulose (ISOPTO TEARS) 2.5 % ophthalmic solution, Place 1 drop into both eyes 2 (two) times daily.  , Disp: , Rfl: ;  ipratropium-albuterol (DUONEB) 0.5-2.5 (3) MG/3ML SOLN, Take 3 mLs by nebulization every 6 (six) hours as needed. For shortness of breath/ wheezing, Disp: , Rfl: ;  lamivudine (EPIVIR) 100 MG tablet, Take 0.5 tablets (50 mg total) by mouth daily. Take one half tablet daily, Disp: 150 tablet, Rfl: 90 lidocaine (LIDODERM) 5 %, Place 1 patch onto the skin daily. Remove & Discard patch within 12 hours or as directed by MD , Disp: , Rfl: ;  lidocaine-prilocaine (EMLA) cream, Apply 1 application topically 3 (three) times a week. Apply 1 hour prior to dialysis access site on Monday Wednesday and Friday, Disp: , Rfl: ;  LORazepam (ATIVAN) 0.5 MG tablet, Take 1/2 tablet by mouth every 6  hours as needed for anxiety, Disp: 60 tablet, Rfl: 5 metoprolol (LOPRESSOR) 50 MG tablet, Take 50 mg by mouth 2 (two) times daily., Disp: , Rfl: ;  multivitamin (RENA-VIT) TABS tablet, Take 1 tablet by mouth at bedtime., Disp: , Rfl: ;  ondansetron (ZOFRAN) 4 MG tablet, Take 2 mg by mouth every 4 (four) hours. nausea, Disp: , Rfl:  oxyCODONE (OXY IR/ROXICODONE) 5 MG immediate release tablet, Take 5 mg by mouth See admin instructions. Take 1 tablet at bedtime and every 6 hours as needed for pain, Disp: , Rfl: ;  pantoprazole (PROTONIX) 40 MG tablet, Take 40 mg by mouth 2 (two) times daily., Disp: , Rfl: ;  raltegravir (ISENTRESS) 400 MG tablet, Take 400 mg by mouth 2 (two) times daily. , Disp: , Rfl:   sevelamer carbonate (RENVELA) 800 MG tablet, Take 1,600-3,200 mg by mouth 5 (five) times daily. Take 4 tablets with meals and 2 tablets with snacks, Disp: , Rfl: ;  sildenafil (REVATIO) 20 MG tablet, Take 20 mg by mouth 3 (three) times daily. , Disp: , Rfl: ;  vitamin C (ASCORBIC ACID) 500 MG tablet, Take 500 mg by mouth 2 (two) times daily., Disp: , Rfl:  zidovudine (RETROVIR) 100 MG capsule, Take 100 mg by mouth 3 (three) times daily.  , Disp: , Rfl: ;  zolpidem (AMBIEN) 5 MG tablet, Take 5 mg by mouth at bedtime as needed for sleep., Disp: , Rfl:   BP 179/115  Pulse 56  Resp 16  Ht 5\' 2"  (1.575 m)  Wt 108 lb 14.4 oz (49.397 kg)  BMI 19.91 kg/m2  Body mass index is 19.91 kg/(m^2).          Review of Systems patient has end-stage renal disease and a history of pulmonary hypertension.    Objective:   Physical Exam BP 179/115  Pulse 56  Resp 16  Ht 5\' 2"  (1.575 m)  Wt 108 lb 14.4 oz (49.397 kg)  BMI 19.91 kg/m2  Gen. elderly female in wheelchair-in no apparent stress Lungs no rhonchi or wheezing Upper chest wall on the right has dilated subcutaneous veins. Mild swelling right side of neck. AV fistula right forearm with multiple dilated veins. Right hand well perfused. 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 Adele Barthel on Thursday, August 20

## 2013-10-19 NOTE — Interval H&P Note (Signed)
[facility] [place]  History and Physical Update  The patient was interviewed and re-examined.  The patient's previous History and Physical has been reviewed and is unchanged from Dr. Evelena Leyden consult.  There is no change in the plan of care: R arm fistulogram, possible intervention.  Adele Barthel, MD [facility] [place] Office: [phone] Pager: [phone]  10/19/2013, 8:50 AM

## 2013-10-19 NOTE — Discharge Instructions (Signed)
Fistulogram, Care After °Refer to this sheet in the next few weeks. These instructions provide you with information on caring for yourself after your procedure. Your health care provider may also give you more specific instructions. Your treatment has been planned according to current medical practices, but problems sometimes occur. Call your health care provider if you have any problems or questions after your procedure. °WHAT TO EXPECT AFTER THE PROCEDURE °After your procedure, it is typical to have the following: °· A small amount of discomfort in the area where the catheters were placed. °· A small amount of bruising around the fistula. °· Sleepiness and fatigue. °HOME CARE INSTRUCTIONS °· Rest at home for the day following your procedure. °· Do not drive or operate heavy machinery while taking pain medicine. °· Take medicines only as directed by your health care provider. °· Do not take baths, swim, or use a hot tub until your health care provider approves. You may shower 24 hours after the procedure or as directed by your health care provider. °· There are many different ways to close and cover an incision, including stitches, skin glue, and adhesive strips. Follow your health care provider's instructions on: °¨ Incision care. °¨ Bandage (dressing) changes and removal. °¨ Incision closure removal. °· Monitor your dialysis fistula carefully. °SEEK MEDICAL CARE IF: °· You have drainage, redness, swelling, or pain at your catheter site. °· You have a fever. °· You have chills. °SEEK IMMEDIATE MEDICAL CARE IF: °· You feel weak. °· You have trouble balancing. °· You have trouble moving your arms or legs. °· You have problems with your speech or vision. °· You can no longer feel a vibration or buzz when you put your fingers over your dialysis fistula. °· The limb that was used for the procedure: °¨ Swells. °¨ Is painful. °¨ Is cold. °¨ Is discolored, such as blue or pale white. °Document Released: 07/03/2013  Document Reviewed: 04/07/2013 °[employer]® Patient Information ©2015 [employer]. This information is not intended to replace advice given to you by your health care provider. Make sure you discuss any questions you have with your health care provider. ° °

## 2013-10-19 NOTE — Op Note (Signed)
OPERATIVE NOTE   PROCEDURE: 1.  right radiocephalic arteriovenous fistula cannulation under ultrasound guidance 2.  right arm fistulogram 3.  Venoplasty of subclavian vein/innominate vein x  3 (6 mm x 40 mm, 8 mm x 40 mm, 10 mm x 40 mm)  PRE-OPERATIVE DIAGNOSIS: Malfunctioning right radiocephalic arteriovenous fistula  POST-OPERATIVE DIAGNOSIS: same as above   SURGEON: Adele Barthel, MD  ANESTHESIA: local  ESTIMATED BLOOD LOSS: 5 cc  FINDING(S): 1. 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 Patent superior vena cava and right innominate vein  SPECIMEN(S):  None  CONTRAST: 65 cc  INDICATIONS: [name] is a 63 y.o. female who presents with malfunctioning right radiocephalic arteriovenous fistula with likely central venous stenosis.  The patient is scheduled for right arm fistulogram, possible intervention.  The patient is aware the risks include but are not limited to: bleeding, infection, thrombosis of the cannulated access, and possible anaphylactic reaction to the contrast.  The patient is aware of the risks of the procedure and elects to proceed forward.  DESCRIPTION: After full informed written consent was obtained, the patient was brought back to the angiography suite and placed supine upon the angiography table.  The patient was connected to monitoring equipment.  The right forearm was prepped and draped in the standard fashion for a percutaneous access intervention.  Under ultrasound guidance, the right radiocephalic arteriovenous fistula was cannulated with a micropuncture needle.  The microwire was advanced into the fistula and the needle was exchanged for the a microsheath, which was lodged 2 cm into the access.  The wire was removed and the sheath was connected to the IV extension tubing.  Hand injections were completed to image the access from the antecubitum up to the level of axilla.  The central venous  structures were also imaged by hand injections.  Based on the images, this patient will need: attempt at venoplasty of stenosis at confluence of subclavian vein and innominate vein.  A [name] was advanced into the axillary vein and the sheath was exchanged for a short 6-Fr sheath.  Over the [facility] wire, I placed an end-hole catheter.  Using the [facility] wire with the end-hole catheter, I could not cross the stenosis.  The wire was exchanged for a Glidewire, which I used to cross the stenosis.  I exchanged the wire for a Rosen wire.  Based on the the imaging, a 6 mm x 40 mm angioplasty balloon was selected.  The balloon was centered around the stenosis and inflated to 20 atm for 1 minute.  No waist was noted.  At this point, the balloon was exchanged for a 8 mm x 40 mm angioplasty balloon.  The balloon was centered around the stenosis and inflated to 18 atm for 2 minutes.  The patient had a slight waist with the inflation.  At this point, the balloon was exchanged for a 10 mm x 40 mm angioplasty balloon.  The balloon was centered around the stenosis and inflated to 12 atm for 2 minutes.  Again a waist was noted which resolved.  The patient began complaining of neck pain with this inflation, so no further intervention was planned.  On completion imaging, a 7.5 mm lumen was present, so I decided to stop the procedure to avoid rupturing this central vein.  I have a suspicion recurrence is highly likely and a stent may be necessary at some point.  I replaced the end-hole catheter over  the wire to recapture the crook of the [employer] wire.  Both were removed together.  Based on the completion imaging, no further intervention is necessary.  The wire and balloon were removed from the sheath.  A 4-0 Monocryl purse-string suture was sewn around the sheath.  The sheath was removed while tying down the suture.  A sterile bandage was applied to the puncture site.  COMPLICATIONS: none  CONDITION: stable  Adele Barthel,  MD [facility] [place] Office: [phone] Pager: [phone]  10/19/2013 11:29 AM

## 2013-10-20 DIAGNOSIS — D631 Anemia in chronic kidney disease: Secondary | ICD-10-CM | POA: Diagnosis not present

## 2013-10-20 DIAGNOSIS — D472 Monoclonal gammopathy: Secondary | ICD-10-CM | POA: Diagnosis not present

## 2013-10-20 DIAGNOSIS — N186 End stage renal disease: Secondary | ICD-10-CM | POA: Diagnosis not present

## 2013-10-20 DIAGNOSIS — D509 Iron deficiency anemia, unspecified: Secondary | ICD-10-CM | POA: Diagnosis not present

## 2013-10-20 DIAGNOSIS — N039 Chronic nephritic syndrome with unspecified morphologic changes: Secondary | ICD-10-CM | POA: Diagnosis not present

## 2013-10-22 DIAGNOSIS — F411 Generalized anxiety disorder: Secondary | ICD-10-CM | POA: Diagnosis not present

## 2013-10-22 DIAGNOSIS — F321 Major depressive disorder, single episode, moderate: Secondary | ICD-10-CM | POA: Diagnosis not present

## 2013-10-23 DIAGNOSIS — N186 End stage renal disease: Secondary | ICD-10-CM | POA: Diagnosis not present

## 2013-10-23 DIAGNOSIS — D509 Iron deficiency anemia, unspecified: Secondary | ICD-10-CM | POA: Diagnosis not present

## 2013-10-23 DIAGNOSIS — D472 Monoclonal gammopathy: Secondary | ICD-10-CM | POA: Diagnosis not present

## 2013-10-23 DIAGNOSIS — N039 Chronic nephritic syndrome with unspecified morphologic changes: Secondary | ICD-10-CM | POA: Diagnosis not present

## 2013-10-23 DIAGNOSIS — D631 Anemia in chronic kidney disease: Secondary | ICD-10-CM | POA: Diagnosis not present

## 2013-10-25 DIAGNOSIS — D472 Monoclonal gammopathy: Secondary | ICD-10-CM | POA: Diagnosis not present

## 2013-10-25 DIAGNOSIS — N186 End stage renal disease: Secondary | ICD-10-CM | POA: Diagnosis not present

## 2013-10-25 DIAGNOSIS — D631 Anemia in chronic kidney disease: Secondary | ICD-10-CM | POA: Diagnosis not present

## 2013-10-25 DIAGNOSIS — D509 Iron deficiency anemia, unspecified: Secondary | ICD-10-CM | POA: Diagnosis not present

## 2013-10-27 DIAGNOSIS — D631 Anemia in chronic kidney disease: Secondary | ICD-10-CM | POA: Diagnosis not present

## 2013-10-27 DIAGNOSIS — N186 End stage renal disease: Secondary | ICD-10-CM | POA: Diagnosis not present

## 2013-10-27 DIAGNOSIS — N039 Chronic nephritic syndrome with unspecified morphologic changes: Secondary | ICD-10-CM | POA: Diagnosis not present

## 2013-10-27 DIAGNOSIS — D509 Iron deficiency anemia, unspecified: Secondary | ICD-10-CM | POA: Diagnosis not present

## 2013-10-27 DIAGNOSIS — D472 Monoclonal gammopathy: Secondary | ICD-10-CM | POA: Diagnosis not present

## 2013-10-30 DIAGNOSIS — D509 Iron deficiency anemia, unspecified: Secondary | ICD-10-CM | POA: Diagnosis not present

## 2013-10-30 DIAGNOSIS — D472 Monoclonal gammopathy: Secondary | ICD-10-CM | POA: Diagnosis not present

## 2013-10-30 DIAGNOSIS — D631 Anemia in chronic kidney disease: Secondary | ICD-10-CM | POA: Diagnosis not present

## 2013-10-30 DIAGNOSIS — N186 End stage renal disease: Secondary | ICD-10-CM | POA: Diagnosis not present

## 2013-11-01 DIAGNOSIS — N186 End stage renal disease: Secondary | ICD-10-CM | POA: Diagnosis not present

## 2013-11-01 DIAGNOSIS — D472 Monoclonal gammopathy: Secondary | ICD-10-CM | POA: Diagnosis not present

## 2013-11-01 DIAGNOSIS — D509 Iron deficiency anemia, unspecified: Secondary | ICD-10-CM | POA: Diagnosis not present

## 2013-11-01 DIAGNOSIS — D631 Anemia in chronic kidney disease: Secondary | ICD-10-CM | POA: Diagnosis not present

## 2013-11-13 DIAGNOSIS — N186 End stage renal disease: Secondary | ICD-10-CM | POA: Diagnosis not present

## 2013-11-16 DIAGNOSIS — G894 Chronic pain syndrome: Secondary | ICD-10-CM | POA: Diagnosis not present

## 2013-11-26 DIAGNOSIS — F411 Generalized anxiety disorder: Secondary | ICD-10-CM | POA: Diagnosis not present

## 2013-11-26 DIAGNOSIS — F321 Major depressive disorder, single episode, moderate: Secondary | ICD-10-CM | POA: Diagnosis not present

## 2013-11-29 DIAGNOSIS — N186 End stage renal disease: Secondary | ICD-10-CM | POA: Diagnosis not present

## 2013-12-01 DIAGNOSIS — E611 Iron deficiency: Secondary | ICD-10-CM | POA: Diagnosis not present

## 2013-12-01 DIAGNOSIS — D631 Anemia in chronic kidney disease: Secondary | ICD-10-CM | POA: Diagnosis not present

## 2013-12-01 DIAGNOSIS — Z23 Encounter for immunization: Secondary | ICD-10-CM | POA: Diagnosis not present

## 2013-12-01 DIAGNOSIS — N186 End stage renal disease: Secondary | ICD-10-CM | POA: Diagnosis not present

## 2013-12-01 DIAGNOSIS — N2581 Secondary hyperparathyroidism of renal origin: Secondary | ICD-10-CM | POA: Diagnosis not present

## 2013-12-01 DIAGNOSIS — Z992 Dependence on renal dialysis: Secondary | ICD-10-CM | POA: Diagnosis not present

## 2013-12-01 DIAGNOSIS — R110 Nausea: Secondary | ICD-10-CM | POA: Diagnosis not present

## 2013-12-01 DIAGNOSIS — D509 Iron deficiency anemia, unspecified: Secondary | ICD-10-CM | POA: Diagnosis not present

## 2013-12-14 DIAGNOSIS — G894 Chronic pain syndrome: Secondary | ICD-10-CM | POA: Diagnosis not present

## 2013-12-18 ENCOUNTER — Non-Acute Institutional Stay (SKILLED_NURSING_FACILITY): Payer: Medicare Other | Admitting: Internal Medicine

## 2013-12-18 DIAGNOSIS — B9789 Other viral agents as the cause of diseases classified elsewhere: Principal | ICD-10-CM

## 2013-12-18 DIAGNOSIS — K219 Gastro-esophageal reflux disease without esophagitis: Secondary | ICD-10-CM

## 2013-12-18 DIAGNOSIS — J069 Acute upper respiratory infection, unspecified: Secondary | ICD-10-CM

## 2013-12-18 DIAGNOSIS — R062 Wheezing: Secondary | ICD-10-CM | POA: Diagnosis not present

## 2013-12-18 DIAGNOSIS — R0602 Shortness of breath: Secondary | ICD-10-CM | POA: Diagnosis not present

## 2013-12-20 ENCOUNTER — Non-Acute Institutional Stay (SKILLED_NURSING_FACILITY): Payer: Medicare Other | Admitting: Internal Medicine

## 2013-12-20 DIAGNOSIS — Z992 Dependence on renal dialysis: Secondary | ICD-10-CM | POA: Diagnosis not present

## 2013-12-20 DIAGNOSIS — J069 Acute upper respiratory infection, unspecified: Secondary | ICD-10-CM

## 2013-12-20 DIAGNOSIS — B9789 Other viral agents as the cause of diseases classified elsewhere: Principal | ICD-10-CM

## 2013-12-20 DIAGNOSIS — N186 End stage renal disease: Secondary | ICD-10-CM | POA: Diagnosis not present

## 2013-12-21 DIAGNOSIS — D638 Anemia in other chronic diseases classified elsewhere: Secondary | ICD-10-CM | POA: Diagnosis not present

## 2013-12-21 DIAGNOSIS — E785 Hyperlipidemia, unspecified: Secondary | ICD-10-CM | POA: Diagnosis not present

## 2013-12-21 DIAGNOSIS — A539 Syphilis, unspecified: Secondary | ICD-10-CM | POA: Diagnosis not present

## 2013-12-21 DIAGNOSIS — N186 End stage renal disease: Secondary | ICD-10-CM | POA: Diagnosis not present

## 2013-12-21 DIAGNOSIS — B20 Human immunodeficiency virus [HIV] disease: Secondary | ICD-10-CM | POA: Diagnosis not present

## 2013-12-21 DIAGNOSIS — Z79899 Other long term (current) drug therapy: Secondary | ICD-10-CM | POA: Diagnosis not present

## 2013-12-23 ENCOUNTER — Emergency Department (HOSPITAL_COMMUNITY)
Admission: EM | Admit: 2013-12-23 | Discharge: 2013-12-23 | Disposition: A | Discharge status: Home / Self Care | Payer: Medicare Other | Attending: Emergency Medicine | Admitting: Emergency Medicine

## 2013-12-23 DIAGNOSIS — Z87891 Personal history of nicotine dependence: Secondary | ICD-10-CM | POA: Diagnosis not present

## 2013-12-23 DIAGNOSIS — Z8619 Personal history of other infectious and parasitic diseases: Secondary | ICD-10-CM | POA: Insufficient documentation

## 2013-12-23 DIAGNOSIS — R062 Wheezing: Secondary | ICD-10-CM | POA: Diagnosis not present

## 2013-12-23 DIAGNOSIS — R030 Elevated blood-pressure reading, without diagnosis of hypertension: Secondary | ICD-10-CM | POA: Diagnosis not present

## 2013-12-23 DIAGNOSIS — Z880 Allergy status to penicillin: Secondary | ICD-10-CM | POA: Insufficient documentation

## 2013-12-23 DIAGNOSIS — Z862 Personal history of diseases of the blood and blood-forming organs and certain disorders involving the immune mechanism: Secondary | ICD-10-CM | POA: Diagnosis not present

## 2013-12-23 DIAGNOSIS — Z992 Dependence on renal dialysis: Secondary | ICD-10-CM | POA: Diagnosis not present

## 2013-12-23 DIAGNOSIS — Z21 Asymptomatic human immunodeficiency virus [HIV] infection status: Secondary | ICD-10-CM | POA: Insufficient documentation

## 2013-12-23 DIAGNOSIS — R6889 Other general symptoms and signs: Secondary | ICD-10-CM | POA: Diagnosis not present

## 2013-12-23 DIAGNOSIS — Z1321 Encounter for screening for nutritional disorder: Secondary | ICD-10-CM | POA: Diagnosis not present

## 2013-12-23 DIAGNOSIS — N186 End stage renal disease: Secondary | ICD-10-CM | POA: Insufficient documentation

## 2013-12-23 DIAGNOSIS — F419 Anxiety disorder, unspecified: Secondary | ICD-10-CM | POA: Diagnosis not present

## 2013-12-23 DIAGNOSIS — Z91040 Latex allergy status: Secondary | ICD-10-CM | POA: Insufficient documentation

## 2013-12-23 DIAGNOSIS — I10 Essential (primary) hypertension: Secondary | ICD-10-CM | POA: Diagnosis not present

## 2013-12-23 DIAGNOSIS — Z79899 Other long term (current) drug therapy: Secondary | ICD-10-CM | POA: Insufficient documentation

## 2013-12-23 DIAGNOSIS — Z1389 Encounter for screening for other disorder: Secondary | ICD-10-CM | POA: Diagnosis not present

## 2013-12-23 DIAGNOSIS — F329 Major depressive disorder, single episode, unspecified: Secondary | ICD-10-CM | POA: Diagnosis not present

## 2013-12-23 DIAGNOSIS — I120 Hypertensive chronic kidney disease with stage 5 chronic kidney disease or end stage renal disease: Secondary | ICD-10-CM | POA: Diagnosis not present

## 2013-12-23 DIAGNOSIS — Z139 Encounter for screening, unspecified: Secondary | ICD-10-CM

## 2013-12-23 DIAGNOSIS — Z7401 Bed confinement status: Secondary | ICD-10-CM | POA: Diagnosis not present

## 2013-12-23 LAB — COMPREHENSIVE METABOLIC PANEL
ALT: 15 U/L (ref 0–35)
AST: 25 U/L (ref 0–37)
Albumin: 3.3 g/dL — ABNORMAL LOW (ref 3.5–5.2)
Alkaline Phosphatase: 155 U/L — ABNORMAL HIGH (ref 39–117)
Anion gap: 16 — ABNORMAL HIGH (ref 5–15)
BUN: 48 mg/dL — ABNORMAL HIGH (ref 6–23)
CALCIUM: 8.9 mg/dL (ref 8.4–10.5)
CO2: 28 meq/L (ref 19–32)
Chloride: 97 mEq/L (ref 96–112)
Creatinine, Ser: 8.55 mg/dL — ABNORMAL HIGH (ref 0.50–1.10)
GFR, EST AFRICAN AMERICAN: 5 mL/min — AB (ref >90)
GFR, EST NON AFRICAN AMERICAN: 4 mL/min — AB (ref >90)
GLUCOSE: 141 mg/dL — AB (ref 70–99)
POTASSIUM: 3.9 meq/L (ref 3.7–5.3)
Sodium: 141 mEq/L (ref 137–147)
TOTAL PROTEIN: 8.6 g/dL — AB (ref 6.0–8.3)
Total Bilirubin: 0.4 mg/dL (ref 0.3–1.2)

## 2013-12-23 NOTE — ED Provider Notes (Signed)
CSN: [id number]     Arrival date & time 12/23/13  1218 History   First MD Initiated Contact with Patient 12/23/13 1342     Chief Complaint  Patient presents with  . abnormal labs      HPI Pt was seen at 1400. Per EMS, NH report and pt, c/o gradual onset and persistence of constant "low calcium" on lab work obtained by the NH 2 days ago. Pt states she had her usual HD appointment yesterday. Pt denies any complaints, states she "feels fine."    Past Medical History  Diagnosis Date  . Pulmonary HTN   . Hypertension   . Depression   . Insomnia   . Chronic diarrhea   . HIV (human immunodeficiency virus infection)   . ESRD on hemodialysis   . C. difficile colitis 10/10/11  . Muscle weakness (generalized)   . Intracranial hemorrhage   . Anxiety   . Anemia    Past Surgical History  Procedure Laterality Date  . Dialysis shunts      previous one removed from left arm and now present one in right arm  . Esophagogastroduodenoscopy  12/15/2010    Procedure: ESOPHAGOGASTRODUODENOSCOPY (EGD);  Surgeon: Daneil Dolin, MD;  [facility];  Service: Endoscopy;  Laterality: N/A;  . Tonsillectomy    . Biopsy thyroid    . Excision of breast biopsy Right 05/04/2012    Procedure: EXCISION OF BREAST BIOPSY;  Surgeon: Donato Heinz, MD;  Location: [facility];  Service: General;  Laterality: Right;  Right Excisional Breast Biopsy   Family History  Problem Relation Age of Onset  . Anesthesia problems Neg Hx   . Hypotension Neg Hx   . Malignant hyperthermia Neg Hx   . Pseudochol deficiency Neg Hx    History  Substance Use Topics  . Smoking status: Former Research scientist (life sciences)  . Smokeless tobacco: Never Used  . Alcohol Use: No   OB History   Grav Para Term Preterm Abortions TAB SAB Ect Mult Living   1 1 1       1      Review of Systems ROS: Statement: All systems negative except as marked or noted in the HPI; Constitutional: Negative for fever and chills. ; ; Eyes: Negative for eye pain, redness  and discharge. ; ; ENMT: Negative for ear pain, hoarseness, nasal congestion, sinus pressure and sore throat. ; ; Cardiovascular: Negative for chest pain, palpitations, diaphoresis, dyspnea and peripheral edema. ; ; Respiratory: Negative for cough, wheezing and stridor. ; ; Gastrointestinal: Negative for nausea, vomiting, diarrhea, abdominal pain, blood in stool, hematemesis, jaundice and rectal bleeding. . ; ; Genitourinary: Negative for dysuria, flank pain and hematuria. ; ; Musculoskeletal: Negative for back pain and neck pain. Negative for swelling and trauma.; ; Skin: Negative for pruritus, rash, abrasions, blisters, bruising and skin lesion.; ; Neuro: Negative for headache, lightheadedness and neck stiffness. Negative for weakness, altered level of consciousness , altered mental status, extremity weakness, paresthesias, involuntary movement, seizure and syncope.      Allergies  Penicillins and Latex  Home Medications   Prior to Admission medications   Medication Sig Start Date End Date Taking? Authorizing Provider  buPROPion (WELLBUTRIN XL) 150 MG 24 hr tablet Take 150 mg by mouth daily.    Historical Provider, MD  diltiazem (CARDIZEM) 60 MG tablet Take 60 mg by mouth every 8 (eight) hours.    Historical Provider, MD  DULoxetine (CYMBALTA) 30 MG capsule Take 30 mg by mouth daily. Take  with 60mg  capsule for total of 90 mg.    Historical Provider, MD  DULoxetine (CYMBALTA) 60 MG capsule Take 60 mg by mouth daily. Take with 30mg  capsule for a total of 90mg     Historical Provider, MD  epoetin alfa (EPOGEN,PROCRIT) 60454 UNIT/ML injection Inject 8,000 Units into the skin 3 (three) times a week. To be given at dialysis, Monday Wednesday and Friday    Historical Provider, MD  hydroxypropyl methylcellulose (ISOPTO TEARS) 2.5 % ophthalmic solution Place 1 drop into both eyes 2 (two) times daily.      Historical Provider, MD  ipratropium-albuterol (DUONEB) 0.5-2.5 (3) MG/3ML SOLN Take 3 mLs by  nebulization every 6 (six) hours as needed. For shortness of breath/ wheezing    Historical Provider, MD  lamivudine (EPIVIR) 100 MG tablet Take 0.5 tablets (50 mg total) by mouth daily. Take one half tablet daily 06/11/10   Campbell Riches, MD  lidocaine (LIDODERM) 5 % Place 1 patch onto the skin 3 (three) times a week. On dialysis days    Historical Provider, MD  lidocaine-prilocaine (EMLA) cream Apply 1 application topically 3 (three) times a week. Apply 1 hour prior to dialysis access site on Monday Wednesday and Friday    Historical Provider, MD  LORazepam (ATIVAN) 0.5 MG tablet Take 0.25 mg by mouth every 6 (six) hours as needed for anxiety. 09/08/13   Tiffany L Reed, DO  metoprolol (LOPRESSOR) 50 MG tablet Take 50 mg by mouth 2 (two) times daily.    Historical Provider, MD  multivitamin (RENA-VIT) TABS tablet Take 1 tablet by mouth at bedtime.    Historical Provider, MD  ondansetron (ZOFRAN) 4 MG tablet Take 2 mg by mouth every 4 (four) hours. nausea    Historical Provider, MD  oxyCODONE (OXY IR/ROXICODONE) 5 MG immediate release tablet Take 5 mg by mouth every 6 (six) hours as needed for moderate pain.     Historical Provider, MD  oxycodone (OXY-IR) 5 MG capsule Take 5 mg by mouth at bedtime.    Historical Provider, MD  pantoprazole (PROTONIX) 40 MG tablet Take 40 mg by mouth 2 (two) times daily.    Historical Provider, MD  raltegravir (ISENTRESS) 400 MG tablet Take 400 mg by mouth 2 (two) times daily.     Historical Provider, MD  sevelamer carbonate (RENVELA) 800 MG tablet Take 800-1,600 mg by mouth 5 (five) times daily. Take 2 tablets with meals and 1 tablet with snacks    Historical Provider, MD  sildenafil (REVATIO) 20 MG tablet Take 20 mg by mouth 3 (three) times daily.     Historical Provider, MD  vitamin C (ASCORBIC ACID) 500 MG tablet Take 500 mg by mouth 2 (two) times daily.    Historical Provider, MD  zidovudine (RETROVIR) 100 MG capsule Take 100 mg by mouth 3 (three) times daily.       Historical Provider, MD  zolpidem (AMBIEN) 5 MG tablet Take 5 mg by mouth at bedtime as needed for sleep.    Historical Provider, MD   BP 149/76  Pulse 65  Temp(Src) 98.6 F (37 C)  Resp 20  SpO2 98% Physical Exam 1405: Physical examination:  Nursing notes reviewed; Vital signs and O2 SAT reviewed;  Constitutional: Well developed, Well nourished, Well hydrated, In no acute distress; Head:  Normocephalic, atraumatic; Eyes: EOMI, PERRL, No scleral icterus; ENMT: Mouth and pharynx normal, Mucous membranes moist; Neck: Supple, Full range of motion, No lymphadenopathy; Cardiovascular: Regular rate and rhythm, No gallop; Respiratory: Breath sounds  clear & equal bilaterally, No wheezes.  Speaking full sentences with ease, Normal respiratory effort/excursion; Chest: Nontender, Movement normal; Abdomen: Soft, Nontender, Nondistended, Normal bowel sounds; Genitourinary: No CVA tenderness; Extremities: Pulses normal, No tenderness, No edema, No calf edema or asymmetry.; Neuro: AA&Ox3, Major CN grossly intact.  Speech clear. No gross focal motor or sensory deficits in extremities.; Skin: Color normal, Warm, Dry.   ED Course  Procedures     EKG Interpretation None      MDM  MDM Reviewed: previous chart, nursing note and vitals Reviewed previous: labs Interpretation: labs    Results for orders placed during the hospital encounter of 12/23/13  COMPREHENSIVE METABOLIC PANEL      Result Value Ref Range   Sodium 141  137 - 147 mEq/L   Potassium 3.9  3.7 - 5.3 mEq/L   Chloride 97  96 - 112 mEq/L   CO2 28  19 - 32 mEq/L   Glucose, Bld 141 (*) 70 - 99 mg/dL   BUN 48 (*) 6 - 23 mg/dL   Creatinine, Ser 8.55 (*) 0.50 - 1.10 mg/dL   Calcium 8.9  8.4 - 10.5 mg/dL   Total Protein 8.6 (*) 6.0 - 8.3 g/dL   Albumin 3.3 (*) 3.5 - 5.2 g/dL   AST 25  0 - 37 U/L   ALT 15  0 - 35 U/L   Alkaline Phosphatase 155 (*) 39 - 117 U/L   Total Bilirubin 0.4  0.3 - 1.2 mg/dL   GFR calc non Af Amer 4 (*) >90  mL/min   GFR calc Af Amer 5 (*) >90 mL/min   Anion gap 16 (*) 5 - 15     1415:  Ca normal today. Will d/c back to NH.     Francine Graven, DO 12/25/13 1343

## 2013-12-23 NOTE — Discharge Instructions (Signed)
°Emergency Department Resource Guide °1) Find a Doctor and Pay Out of Pocket °Although you won't have to find out who is covered by your insurance plan, it is a good idea to ask around and get recommendations. You will then need to call the office and see if the doctor you have chosen will accept you as a new patient and what types of options they offer for patients who are self-pay. Some doctors offer discounts or will set up payment plans for their patients who do not have insurance, but you will need to ask so you aren't surprised when you get to your appointment. ° °2) Contact Your Local Health Department °Not all health departments have doctors that can see patients for sick visits, but many do, so it is worth a call to see if yours does. If you don't know where your local health department is, you can check in your phone book. The CDC also has a tool to help you locate your state's health department, and many state websites also have listings of all of their local health departments. ° °3) Find a Walk-in Clinic °If your illness is not likely to be very severe or complicated, you may want to try a walk in clinic. These are popping up all over the country in pharmacies, drugstores, and shopping centers. They're usually staffed by nurse practitioners or physician assistants that have been trained to treat common illnesses and complaints. They're usually fairly quick and inexpensive. However, if you have serious medical issues or chronic medical problems, these are probably not your best option. ° °No Primary Care Doctor: °- Call [facility] at  832-8000 - they can help you locate a primary care doctor that  accepts your insurance, provides certain services, etc. °- Physician Referral Service- 1-800-533-3463 ° °Chronic Pain Problems: °Organization         Address  Phone   Notes  °[facility]  (336) 297-2271 Patients need to be referred by their primary care doctor.  ° °Medication  Assistance: °Organization         Address  Phone   Notes  °[place] [facility] [address] °[place], [place] [address] (336) 641-8030 --Must be a resident of [place] °-- Must have NO insurance coverage whatsoever (no Medicaid/ Medicare, etc.) °-- The pt. MUST have a primary care doctor that directs their care regularly and follows them in the community °  °[facility]  (866) 331-1348   °[employer]  (888) 892-1162   ° °Agencies that provide inexpensive medical care: °Organization         Address  Phone   Notes  °[facility]  (336) 832-8035   °[facility]    (336) 832-7272   °[facility] [address] °[place], [place] [address] (336) 832-4777   °[facility] [address], °[place] (336) 271-4999   °[facility]    (336) 373-0678   °[facility]    (336) 272-1050   °[facility] ° [address],  Phone:  (336) 832-4444, Fax:  (336) 832-4440 Hours of Operation:  9 am - 6 pm, M-F.  Also accepts Medicaid/Medicare and self-pay.  °[facility] ° [address],  Phone: (336) 832-3150, Fax: (336) [phone]. Hours of Operation:  8:30 am - 5:30 pm, M-F.  Also accepts Medicaid and self-pay.  °[facility] [place] [address], [place] Phone: (336) 878-6027   °[facility] [address], [place], [place] (336)723-1848, Ext. 123 Mondays & Thursdays: 7-9 AM.  First 15 patients are seen on a first come, first serve basis. °  ° °Medicaid-accepting [place] Providers: ° °Organization         Address  Phone   Notes  °[facility] [address], [place] (336) 641-2100 Also accepts self-pay patients.  °[facility] [address], [place] ° (336) 856-9996   °[facility] [address], [place]  (336) 288-8857   °[facility] [address] [place] [address], [place] (336) 299-7000   °Veita Bland [address], [place]  ° (336) 373-1557 Only accepts [place] Access Medicaid patients after they have their name applied to their card.  ° °Self-Pay (no insurance) in [place]: ° °Organization         Address  Phone   Notes  °Sickle Cell Patients, [facility] [address], [place] (336) 832-1970   °[facility] [address], [place] (336) 832-4400   °[facility] ° [address], [place] (336) 992-4800   °[facility]/Dr. Osei-Bonsu ° [address] [place] [address], [place] or [address], [place] (336) [phone] Phone number for both [place] and [place] locations is the same.  °[facility] [address], [place] (336) 299-0000   °[facility] [address] [place] [address], [place] or [address] (336) 852-7530 °(336) 878-2260   °[facility] [address], [place] (336) 350-1642, phone; (336) 294-5005, fax Sees patients 1st and 3rd Saturday of every month.  Must not qualify for public or private insurance (i.e. Medicaid, Medicare, [place] Health Choice, Veterans' Benefits) • Household income should be no more than 200% of the poverty level •The clinic cannot treat you if you are pregnant or think you are pregnant • Sexually transmitted diseases are not treated at the clinic.  ° ° °Dental Care: °Organization         Address  Phone  Notes  °[place] [facility] [address], [place] (336) 641-6152 Accepts children up to age 21 who are enrolled in Medicaid or [place] Health Choice; pregnant women with a Medicaid card; and children who have applied for Medicaid or [place] Health Choice, but were declined, whose parents can pay a reduced fee at time of service.  °[place]  [facility] [place]  [address], [place] (336) 641-7733 Accepts children up to age 21 who are enrolled in Medicaid or [place] Health Choice; pregnant women with a Medicaid card; and children who have applied for Medicaid or  Health Choice, but were declined, whose parents can pay a reduced fee at time of service.  °[facility] ° [address], [place] (336) 641-4533 Patients are seen by appointment only. Walk-ins are not accepted. [facility] will see patients 18 years of age and older. °Monday - Tuesday (8am-5pm) °Most Wednesdays (8:30-5pm) °$30 per visit, cash only  °[facility] ° [address], [place] (336) 641-4533 Patients are seen by appointment only. Walk-ins are not accepted. [facility] will see patients 18 years of age and older. °One   Wednesday Evening (Monthly: Volunteer Based).  $30 per visit, cash only  °[facility]  (919) 537-3737 for adults; Children under age 4, call Graduate Pediatric Dentistry at (919) [phone]. Children aged 4-14, please call (919) [phone] to request a pediatric application. ° Dental services are provided in all areas of dental care including fillings, crowns and bridges, complete and partial dentures, implants, gum treatment, root canals, and extractions. Preventive care is also provided. Treatment is provided to both adults and children. °Patients are selected via a lottery and there is often a waiting list. °  °[facility] [address], °[place] ° (336) 763-8833 [email or web address] °  °[facility] [address], [place], [place] (336)723-1848, Ext. 123 Second and Fourth Thursday of each month, opens at 6:30 AM; Clinic ends at 9 AM.  Patients are seen on a first-come first-served basis, and a limited number are seen during each clinic.  ° °[facility] ° [address], [place], [place] (336) [phone]    Eligibility Requirements °You must have lived in [place], [place], or [place] counties for at least the last three months. °  You cannot be eligible for state or federal sponsored healthcare insurance, including Veterans Administration, Medicaid, or Medicare. °  You generally cannot be eligible for healthcare insurance through your employer.  °  How to apply: °Eligibility screenings are held every Tuesday and Wednesday afternoon from 1:00 pm until 4:00 pm. You do not need an appointment for the interview!  °[facility] [address], [place], [place] 336-631-2330   °[facility] [facility]  336-342-8273   °[facility]  336-703-3100   °[facility]  336-570-6415   ° °Behavioral Health Resources in the Community: °Intensive Outpatient Programs °Organization         Address  Phone  Notes  °[facility] [address], [place], [place] 336-878-6098   °[facility] [address], [place], [place] 336-832-9800   °[facility] [address], [place], [place] ° 336-882-2125   °[facility] [address],  °[place], [place] 1-800-853-5163 or 336-641-4981   °Substance Abuse Resources °Organization         Address  Phone  Notes  °[facility]  336-882-2125   °[facility]  336-784-9470   °[facility]  336-285-9073   °[facility]  336-845-3988   °Residential & Outpatient Substance Abuse Program  1-800-659-3381   °Psychological Services °Organization         Address  Phone  Notes  °[facility]  336- 832-9600   °[facility]  336- 378-7881   °[facility] [address], [place] 1-800-853-5163 or 336-641-4981   ° °Mobile Crisis Teams °Organization         Address  Phone  Notes  °[facility], Mobile Crisis Care Unit  1-877-626-1772   °Assertive °[facility] ° [address]  [place], [place] 336-834-9664   °Sharon DeEsch [address] °[place] 336-554-5454   ° °Self-Help/Support Groups °Organization         Address  Phone             Notes  °[facility]  - variety of support groups  336- 373-1402 Call for more information  °Narcotics Anonymous (NA), [facility] [address], °[place]   2 meetings at this location  ° °  Residential Treatment Programs °Organization         Address  Phone  Notes  °[facility] [address],    °[place]  1-866-801-8205   °[facility] ° [address], [place], [place] 704-293-8524   °[facility] [address], [place] 336-845-3988 Admissions: 8am-3pm M-F  °[facility] [address]    °[place], [place] 336-841-1104   °[facility] [address], [place], [place] 336-379-7146   °[facility] [address]  °[place], [place] 336-285-9073   °[facility] Intensive Outpatient [address], [place], [place] 336-852-3033   °[facility] [address]  °[place], [place] 1-877-615-2722 or 336-784-9470   °[facility] [address] [place], [place] 336-227-7417 Accepts Medicaid  °[facility] [address]  °[place] 1-800-659-3381 Substance Abuse/Addiction Treatment  ° °[place] Behavioral Health Resources °Organization         Address  Phone  Notes  °[facility]  (888) 581-9988   °Julie Brannon, PhD [address] [place], [place]   (336) 349-5553 or (336) 951-0000   °[facility]   [address] °[place], [place] (336) 349-4454   °[facility] [address], [place], [place] (336) 342-8316 Insurance/Medicaid/sponsorship through [facility]  °[facility] [address]                                    [place], [place] (336) 342-8316 Therapy/tele-psych/case    °[facility] [address]  ° [place], [place] (336) 349-2233    °Dr. Arfeen  (336) 349-4544   °[facility] [place]  [employer] [place] [facility] 1) [address],  °2) [address], [place] °3)  [address], [place] (336) 349-3220 °(336) 342-7768 ° °(336) 342-8140   °[place] Child Abuse [facility] (336) 342-1394 or (336) 342-3537 (After Hours)    ° ° °Take your usual prescriptions as previously directed.  Call your regular medical doctor on Monday to schedule a follow up appointment within the next 2 days.  Return to the Emergency Department immediately sooner if worsening.  ° °

## 2013-12-23 NOTE — ED Notes (Signed)
Pt from [facility], sent for evaluation of Calcium level 6.6;

## 2013-12-24 DIAGNOSIS — F331 Major depressive disorder, recurrent, moderate: Secondary | ICD-10-CM | POA: Diagnosis not present

## 2013-12-24 DIAGNOSIS — F411 Generalized anxiety disorder: Secondary | ICD-10-CM | POA: Diagnosis not present

## 2013-12-25 NOTE — Progress Notes (Signed)
Patient ID: ANUREET PINELA, female   DOB: 02-Dec-1950, 63 y.o.   MRN: [id number]               PROGRESS NOTE  DATE:  12/18/2013    [name]     LEVEL OF CARE:   SNF   Acute Visit   CHIEF COMPLAINT:  Cough, shortness of breath.    HISTORY OF PRESENT ILLNESS:  I was called this morning to a report that Mrs. Pettitt has a harsh, barking cough and shortness of breath.   A chest x-ray was done that is actually clear.  The patient tells me she has nasal congestion, a harsh cough, and shortness of breath.  She states she may have a history of asthma, although I do not actually see this on her problem list.    Since I last reviewed this patient, she has been to the [facility] where a mammogram shows no evidence of a problem in either breast, including the chronically enlarged one on the right.  It was recommended to follow up in a year.    She also has had an angioplasty of her subclavian vein by Dr. Bridgett Larsson of Vascular Surgery.  A stent was not placed, although his note suggested this may be necessary in the future.    I do not think she has seen Infectious Disease since the end of April.    REVIEW OF SYSTEMS:   CHEST/RESPIRATORY:  She complains of wheezing and shortness of breath.   CARDIAC:   No clear chest pain.   GI:  No abdominal pain.    PHYSICAL EXAMINATION:   VITAL SIGNS:   O2 SATURATIONS:  Not done.   RESPIRATIONS:  24.   PULSE:  96.   GENERAL APPEARANCE:  The patient looks ill.   CHEST/RESPIRATORY:  There is prolonged expiratory wheezing, although her air entry is not too bad.  There is no stridor.   CARDIOVASCULAR:  CARDIAC:  Heart sounds are distant.  JVP is not elevated.  There is no convincing evidence of congestive heart failure.   GASTROINTESTINAL:  ABDOMEN:   Soft.   LIVER/SPLEEN/KIDNEYS:  No liver, no spleen.  No tenderness.    ASSESSMENT/PLAN:  Probably upper respiratory tract infection, although I am really concerned with the degree of  wheezing that this patient exhibits.  She is going to need regular nebulizers.  I am probably going to give her some parenteral steroids for the moment, as well.    Gastroesophageal reflux disease.  This has been significant in the past, although I am not sure she is having reflux type symptoms to this degree.     As mentioned above, her chest x-ray is normal.  I am going to manage her aggressively for bronchospasm for the next 48 hours with careful monitoring.  She missed dialysis today.  I encouraged her to absolutely go to this on Wednesday or she might end up in the hospital.     CPT CODE: 10932

## 2013-12-28 NOTE — Progress Notes (Signed)
Patient ID: [name], female   DOB: 1950/03/25, 63 y.o.   MRN: [id number]               PROGRESS NOTE  DATE:  12/20/2013    FACILITY: [name]    LEVEL OF CARE:   SNF   Acute Visit   CHIEF COMPLAINT:  Follow up cough and wheezing.    HISTORY OF PRESENT ILLNESS:  I saw [name] for this on 12/18/2013.  A chest x-ray was ordered that was clear.  I have been giving her routine DuoNebs.  I gave her Solu-Medrol and a steroid taper.  However, she is still wheezing.  The patient tells me that she has a history of asthma years ago, although this is not listed on her problem list.    REVIEW OF SYSTEMS:   CHEST/RESPIRATORY:  Still a barking cough and wheezing.   CARDIAC:   She is not complaining of chest pain.  No palpitations.   GI:  No abdominal pain.    PHYSICAL EXAMINATION:   VITAL SIGNS:   TEMPERATURE:  98.8.   BLOOD PRESSURE:  118/70.    PULSE:  70.   RESPIRATIONS:  22.   O2 SATURATIONS:  95%.   CHEST/RESPIRATORY:   Her air entry is fairly good.  However, she has prolonged expiratory wheezing.   She is not particularly tachypneic.  She is not working hard to breathe.  Overall, the situation seems somewhat better.   CARDIOVASCULAR:  CARDIAC:  Heart sounds are tachypneic.  There are no obvious signs of fluid overload at the bedside.  Although she did refuse to go to dialysis on Monday, she went today.   EDEMA/VARICOSITIES:  Extremities:  No evidence of a DVT.     ASSESSMENT/PLAN:  Viral bronchitis with bronchospasm.  The patient states she has a history of asthma and this certainly would fit with an asthma exacerbation.  She is somewhat better.  I am going to try to give her something for cough.  We will start her on an antibiotic for bronchitis.    CPT CODE: 28413

## 2013-12-29 DIAGNOSIS — R262 Difficulty in walking, not elsewhere classified: Secondary | ICD-10-CM | POA: Diagnosis not present

## 2013-12-29 DIAGNOSIS — I471 Supraventricular tachycardia: Secondary | ICD-10-CM | POA: Diagnosis not present

## 2013-12-29 DIAGNOSIS — N186 End stage renal disease: Secondary | ICD-10-CM | POA: Diagnosis not present

## 2013-12-30 DIAGNOSIS — N186 End stage renal disease: Secondary | ICD-10-CM | POA: Diagnosis not present

## 2013-12-30 DIAGNOSIS — Z992 Dependence on renal dialysis: Secondary | ICD-10-CM | POA: Diagnosis not present

## 2014-01-01 ENCOUNTER — Encounter: Payer: Self-pay | Admitting: Vascular Surgery

## 2014-01-01 DIAGNOSIS — N2581 Secondary hyperparathyroidism of renal origin: Secondary | ICD-10-CM | POA: Diagnosis not present

## 2014-01-01 DIAGNOSIS — E611 Iron deficiency: Secondary | ICD-10-CM | POA: Diagnosis not present

## 2014-01-01 DIAGNOSIS — N186 End stage renal disease: Secondary | ICD-10-CM | POA: Diagnosis not present

## 2014-01-01 DIAGNOSIS — Z23 Encounter for immunization: Secondary | ICD-10-CM | POA: Diagnosis not present

## 2014-01-01 DIAGNOSIS — D631 Anemia in chronic kidney disease: Secondary | ICD-10-CM | POA: Diagnosis not present

## 2014-01-01 DIAGNOSIS — Z992 Dependence on renal dialysis: Secondary | ICD-10-CM | POA: Diagnosis not present

## 2014-01-02 ENCOUNTER — Ambulatory Visit: Payer: Medicare Other | Admitting: Infectious Diseases

## 2014-01-02 DIAGNOSIS — I471 Supraventricular tachycardia: Secondary | ICD-10-CM | POA: Diagnosis not present

## 2014-01-02 DIAGNOSIS — R262 Difficulty in walking, not elsewhere classified: Secondary | ICD-10-CM | POA: Diagnosis not present

## 2014-01-02 DIAGNOSIS — N186 End stage renal disease: Secondary | ICD-10-CM | POA: Diagnosis not present

## 2014-01-03 ENCOUNTER — Ambulatory Visit: Payer: Medicare Other | Admitting: Infectious Diseases

## 2014-01-03 DIAGNOSIS — I471 Supraventricular tachycardia: Secondary | ICD-10-CM | POA: Diagnosis not present

## 2014-01-03 DIAGNOSIS — R262 Difficulty in walking, not elsewhere classified: Secondary | ICD-10-CM | POA: Diagnosis not present

## 2014-01-03 DIAGNOSIS — N186 End stage renal disease: Secondary | ICD-10-CM | POA: Diagnosis not present

## 2014-01-04 DIAGNOSIS — N186 End stage renal disease: Secondary | ICD-10-CM | POA: Diagnosis not present

## 2014-01-04 DIAGNOSIS — R262 Difficulty in walking, not elsewhere classified: Secondary | ICD-10-CM | POA: Diagnosis not present

## 2014-01-04 DIAGNOSIS — I471 Supraventricular tachycardia: Secondary | ICD-10-CM | POA: Diagnosis not present

## 2014-01-05 DIAGNOSIS — N186 End stage renal disease: Secondary | ICD-10-CM | POA: Diagnosis not present

## 2014-01-05 DIAGNOSIS — I471 Supraventricular tachycardia: Secondary | ICD-10-CM | POA: Diagnosis not present

## 2014-01-05 DIAGNOSIS — R262 Difficulty in walking, not elsewhere classified: Secondary | ICD-10-CM | POA: Diagnosis not present

## 2014-01-06 DIAGNOSIS — I471 Supraventricular tachycardia: Secondary | ICD-10-CM | POA: Diagnosis not present

## 2014-01-06 DIAGNOSIS — R262 Difficulty in walking, not elsewhere classified: Secondary | ICD-10-CM | POA: Diagnosis not present

## 2014-01-06 DIAGNOSIS — N186 End stage renal disease: Secondary | ICD-10-CM | POA: Diagnosis not present

## 2014-01-08 DIAGNOSIS — R262 Difficulty in walking, not elsewhere classified: Secondary | ICD-10-CM | POA: Diagnosis not present

## 2014-01-08 DIAGNOSIS — N186 End stage renal disease: Secondary | ICD-10-CM | POA: Diagnosis not present

## 2014-01-08 DIAGNOSIS — I471 Supraventricular tachycardia: Secondary | ICD-10-CM | POA: Diagnosis not present

## 2014-01-09 DIAGNOSIS — I471 Supraventricular tachycardia: Secondary | ICD-10-CM | POA: Diagnosis not present

## 2014-01-09 DIAGNOSIS — N186 End stage renal disease: Secondary | ICD-10-CM | POA: Diagnosis not present

## 2014-01-09 DIAGNOSIS — R262 Difficulty in walking, not elsewhere classified: Secondary | ICD-10-CM | POA: Diagnosis not present

## 2014-01-10 DIAGNOSIS — N186 End stage renal disease: Secondary | ICD-10-CM | POA: Diagnosis not present

## 2014-01-10 DIAGNOSIS — R262 Difficulty in walking, not elsewhere classified: Secondary | ICD-10-CM | POA: Diagnosis not present

## 2014-01-10 DIAGNOSIS — I471 Supraventricular tachycardia: Secondary | ICD-10-CM | POA: Diagnosis not present

## 2014-01-11 DIAGNOSIS — I471 Supraventricular tachycardia: Secondary | ICD-10-CM | POA: Diagnosis not present

## 2014-01-11 DIAGNOSIS — R262 Difficulty in walking, not elsewhere classified: Secondary | ICD-10-CM | POA: Diagnosis not present

## 2014-01-11 DIAGNOSIS — N186 End stage renal disease: Secondary | ICD-10-CM | POA: Diagnosis not present

## 2014-01-12 DIAGNOSIS — N186 End stage renal disease: Secondary | ICD-10-CM | POA: Diagnosis not present

## 2014-01-12 DIAGNOSIS — I471 Supraventricular tachycardia: Secondary | ICD-10-CM | POA: Diagnosis not present

## 2014-01-12 DIAGNOSIS — R262 Difficulty in walking, not elsewhere classified: Secondary | ICD-10-CM | POA: Diagnosis not present

## 2014-01-15 DIAGNOSIS — I471 Supraventricular tachycardia: Secondary | ICD-10-CM | POA: Diagnosis not present

## 2014-01-15 DIAGNOSIS — N186 End stage renal disease: Secondary | ICD-10-CM | POA: Diagnosis not present

## 2014-01-15 DIAGNOSIS — R262 Difficulty in walking, not elsewhere classified: Secondary | ICD-10-CM | POA: Diagnosis not present

## 2014-01-16 DIAGNOSIS — I471 Supraventricular tachycardia: Secondary | ICD-10-CM | POA: Diagnosis not present

## 2014-01-16 DIAGNOSIS — R262 Difficulty in walking, not elsewhere classified: Secondary | ICD-10-CM | POA: Diagnosis not present

## 2014-01-16 DIAGNOSIS — N186 End stage renal disease: Secondary | ICD-10-CM | POA: Diagnosis not present

## 2014-01-17 DIAGNOSIS — R262 Difficulty in walking, not elsewhere classified: Secondary | ICD-10-CM | POA: Diagnosis not present

## 2014-01-17 DIAGNOSIS — N186 End stage renal disease: Secondary | ICD-10-CM | POA: Diagnosis not present

## 2014-01-17 DIAGNOSIS — I471 Supraventricular tachycardia: Secondary | ICD-10-CM | POA: Diagnosis not present

## 2014-01-18 DIAGNOSIS — N186 End stage renal disease: Secondary | ICD-10-CM | POA: Diagnosis not present

## 2014-01-18 DIAGNOSIS — R262 Difficulty in walking, not elsewhere classified: Secondary | ICD-10-CM | POA: Diagnosis not present

## 2014-01-18 DIAGNOSIS — I471 Supraventricular tachycardia: Secondary | ICD-10-CM | POA: Diagnosis not present

## 2014-01-19 DIAGNOSIS — N186 End stage renal disease: Secondary | ICD-10-CM | POA: Diagnosis not present

## 2014-01-19 DIAGNOSIS — I471 Supraventricular tachycardia: Secondary | ICD-10-CM | POA: Diagnosis not present

## 2014-01-19 DIAGNOSIS — R262 Difficulty in walking, not elsewhere classified: Secondary | ICD-10-CM | POA: Diagnosis not present

## 2014-01-22 DIAGNOSIS — N390 Urinary tract infection, site not specified: Secondary | ICD-10-CM | POA: Diagnosis not present

## 2014-01-22 DIAGNOSIS — N186 End stage renal disease: Secondary | ICD-10-CM | POA: Diagnosis not present

## 2014-01-22 DIAGNOSIS — R262 Difficulty in walking, not elsewhere classified: Secondary | ICD-10-CM | POA: Diagnosis not present

## 2014-01-22 DIAGNOSIS — I471 Supraventricular tachycardia: Secondary | ICD-10-CM | POA: Diagnosis not present

## 2014-01-23 ENCOUNTER — Ambulatory Visit: Payer: Medicare Other | Admitting: Infectious Diseases

## 2014-01-23 DIAGNOSIS — N186 End stage renal disease: Secondary | ICD-10-CM | POA: Diagnosis not present

## 2014-01-23 DIAGNOSIS — I471 Supraventricular tachycardia: Secondary | ICD-10-CM | POA: Diagnosis not present

## 2014-01-23 DIAGNOSIS — R262 Difficulty in walking, not elsewhere classified: Secondary | ICD-10-CM | POA: Diagnosis not present

## 2014-01-24 DIAGNOSIS — R262 Difficulty in walking, not elsewhere classified: Secondary | ICD-10-CM | POA: Diagnosis not present

## 2014-01-24 DIAGNOSIS — N186 End stage renal disease: Secondary | ICD-10-CM | POA: Diagnosis not present

## 2014-01-24 DIAGNOSIS — I471 Supraventricular tachycardia: Secondary | ICD-10-CM | POA: Diagnosis not present

## 2014-01-25 DIAGNOSIS — R262 Difficulty in walking, not elsewhere classified: Secondary | ICD-10-CM | POA: Diagnosis not present

## 2014-01-25 DIAGNOSIS — I471 Supraventricular tachycardia: Secondary | ICD-10-CM | POA: Diagnosis not present

## 2014-01-25 DIAGNOSIS — N186 End stage renal disease: Secondary | ICD-10-CM | POA: Diagnosis not present

## 2014-01-26 DIAGNOSIS — I471 Supraventricular tachycardia: Secondary | ICD-10-CM | POA: Diagnosis not present

## 2014-01-26 DIAGNOSIS — N186 End stage renal disease: Secondary | ICD-10-CM | POA: Diagnosis not present

## 2014-01-26 DIAGNOSIS — R262 Difficulty in walking, not elsewhere classified: Secondary | ICD-10-CM | POA: Diagnosis not present

## 2014-01-29 DIAGNOSIS — I471 Supraventricular tachycardia: Secondary | ICD-10-CM | POA: Diagnosis not present

## 2014-01-29 DIAGNOSIS — N186 End stage renal disease: Secondary | ICD-10-CM | POA: Diagnosis not present

## 2014-01-29 DIAGNOSIS — R262 Difficulty in walking, not elsewhere classified: Secondary | ICD-10-CM | POA: Diagnosis not present

## 2014-01-29 DIAGNOSIS — Z992 Dependence on renal dialysis: Secondary | ICD-10-CM | POA: Diagnosis not present

## 2014-01-30 DIAGNOSIS — N186 End stage renal disease: Secondary | ICD-10-CM | POA: Diagnosis not present

## 2014-01-31 DIAGNOSIS — Z992 Dependence on renal dialysis: Secondary | ICD-10-CM | POA: Diagnosis not present

## 2014-01-31 DIAGNOSIS — D631 Anemia in chronic kidney disease: Secondary | ICD-10-CM | POA: Diagnosis not present

## 2014-01-31 DIAGNOSIS — E611 Iron deficiency: Secondary | ICD-10-CM | POA: Diagnosis not present

## 2014-01-31 DIAGNOSIS — N2581 Secondary hyperparathyroidism of renal origin: Secondary | ICD-10-CM | POA: Diagnosis not present

## 2014-01-31 DIAGNOSIS — N186 End stage renal disease: Secondary | ICD-10-CM | POA: Diagnosis not present

## 2014-02-01 DIAGNOSIS — G894 Chronic pain syndrome: Secondary | ICD-10-CM | POA: Diagnosis not present

## 2014-02-01 DIAGNOSIS — N186 End stage renal disease: Secondary | ICD-10-CM | POA: Diagnosis not present

## 2014-02-02 DIAGNOSIS — Z992 Dependence on renal dialysis: Secondary | ICD-10-CM | POA: Diagnosis not present

## 2014-02-02 DIAGNOSIS — N2581 Secondary hyperparathyroidism of renal origin: Secondary | ICD-10-CM | POA: Diagnosis not present

## 2014-02-02 DIAGNOSIS — E611 Iron deficiency: Secondary | ICD-10-CM | POA: Diagnosis not present

## 2014-02-02 DIAGNOSIS — N186 End stage renal disease: Secondary | ICD-10-CM | POA: Diagnosis not present

## 2014-02-02 DIAGNOSIS — D631 Anemia in chronic kidney disease: Secondary | ICD-10-CM | POA: Diagnosis not present

## 2014-02-04 DIAGNOSIS — F321 Major depressive disorder, single episode, moderate: Secondary | ICD-10-CM | POA: Diagnosis not present

## 2014-02-04 DIAGNOSIS — N186 End stage renal disease: Secondary | ICD-10-CM | POA: Diagnosis not present

## 2014-02-04 DIAGNOSIS — F411 Generalized anxiety disorder: Secondary | ICD-10-CM | POA: Diagnosis not present

## 2014-02-05 DIAGNOSIS — N2581 Secondary hyperparathyroidism of renal origin: Secondary | ICD-10-CM | POA: Diagnosis not present

## 2014-02-05 DIAGNOSIS — Z992 Dependence on renal dialysis: Secondary | ICD-10-CM | POA: Diagnosis not present

## 2014-02-05 DIAGNOSIS — E611 Iron deficiency: Secondary | ICD-10-CM | POA: Diagnosis not present

## 2014-02-05 DIAGNOSIS — D631 Anemia in chronic kidney disease: Secondary | ICD-10-CM | POA: Diagnosis not present

## 2014-02-05 DIAGNOSIS — N186 End stage renal disease: Secondary | ICD-10-CM | POA: Diagnosis not present

## 2014-02-06 DIAGNOSIS — N186 End stage renal disease: Secondary | ICD-10-CM | POA: Diagnosis not present

## 2014-02-07 ENCOUNTER — Telehealth: Payer: Self-pay | Admitting: *Deleted

## 2014-02-07 DIAGNOSIS — E611 Iron deficiency: Secondary | ICD-10-CM | POA: Diagnosis not present

## 2014-02-07 DIAGNOSIS — Z992 Dependence on renal dialysis: Secondary | ICD-10-CM | POA: Diagnosis not present

## 2014-02-07 DIAGNOSIS — D631 Anemia in chronic kidney disease: Secondary | ICD-10-CM | POA: Diagnosis not present

## 2014-02-07 DIAGNOSIS — N2581 Secondary hyperparathyroidism of renal origin: Secondary | ICD-10-CM | POA: Diagnosis not present

## 2014-02-07 DIAGNOSIS — N186 End stage renal disease: Secondary | ICD-10-CM | POA: Diagnosis not present

## 2014-02-07 NOTE — Telephone Encounter (Signed)
Left message with patient's skilled nursing facility to see if she can get lab work done this week; prior to her appointment with Dr. Johnnye Sima on 02/20/14. [name]

## 2014-02-08 ENCOUNTER — Encounter (HOSPITAL_COMMUNITY): Payer: Self-pay | Admitting: Vascular Surgery

## 2014-02-08 DIAGNOSIS — N186 End stage renal disease: Secondary | ICD-10-CM | POA: Diagnosis not present

## 2014-02-09 DIAGNOSIS — Z992 Dependence on renal dialysis: Secondary | ICD-10-CM | POA: Diagnosis not present

## 2014-02-09 DIAGNOSIS — N2581 Secondary hyperparathyroidism of renal origin: Secondary | ICD-10-CM | POA: Diagnosis not present

## 2014-02-09 DIAGNOSIS — E611 Iron deficiency: Secondary | ICD-10-CM | POA: Diagnosis not present

## 2014-02-09 DIAGNOSIS — N186 End stage renal disease: Secondary | ICD-10-CM | POA: Diagnosis not present

## 2014-02-09 DIAGNOSIS — D631 Anemia in chronic kidney disease: Secondary | ICD-10-CM | POA: Diagnosis not present

## 2014-02-12 DIAGNOSIS — Z992 Dependence on renal dialysis: Secondary | ICD-10-CM | POA: Diagnosis not present

## 2014-02-12 DIAGNOSIS — E611 Iron deficiency: Secondary | ICD-10-CM | POA: Diagnosis not present

## 2014-02-12 DIAGNOSIS — N186 End stage renal disease: Secondary | ICD-10-CM | POA: Diagnosis not present

## 2014-02-12 DIAGNOSIS — D631 Anemia in chronic kidney disease: Secondary | ICD-10-CM | POA: Diagnosis not present

## 2014-02-12 DIAGNOSIS — N2581 Secondary hyperparathyroidism of renal origin: Secondary | ICD-10-CM | POA: Diagnosis not present

## 2014-02-13 NOTE — Telephone Encounter (Signed)
Nurse from skilled nursing called back stating that they collected labs in October but patient didn't make that appt. bc she was in the hospital. They collected CD4, Viral load, RPR, lipid and CMP. She will bring those to the appointment on 12/22

## 2014-02-14 DIAGNOSIS — Z992 Dependence on renal dialysis: Secondary | ICD-10-CM | POA: Diagnosis not present

## 2014-02-14 DIAGNOSIS — D631 Anemia in chronic kidney disease: Secondary | ICD-10-CM | POA: Diagnosis not present

## 2014-02-14 DIAGNOSIS — N2581 Secondary hyperparathyroidism of renal origin: Secondary | ICD-10-CM | POA: Diagnosis not present

## 2014-02-14 DIAGNOSIS — E611 Iron deficiency: Secondary | ICD-10-CM | POA: Diagnosis not present

## 2014-02-14 DIAGNOSIS — N186 End stage renal disease: Secondary | ICD-10-CM | POA: Diagnosis not present

## 2014-02-16 DIAGNOSIS — N186 End stage renal disease: Secondary | ICD-10-CM | POA: Diagnosis not present

## 2014-02-16 DIAGNOSIS — E611 Iron deficiency: Secondary | ICD-10-CM | POA: Diagnosis not present

## 2014-02-16 DIAGNOSIS — Z992 Dependence on renal dialysis: Secondary | ICD-10-CM | POA: Diagnosis not present

## 2014-02-16 DIAGNOSIS — N2581 Secondary hyperparathyroidism of renal origin: Secondary | ICD-10-CM | POA: Diagnosis not present

## 2014-02-16 DIAGNOSIS — D631 Anemia in chronic kidney disease: Secondary | ICD-10-CM | POA: Diagnosis not present

## 2014-02-19 DIAGNOSIS — N2581 Secondary hyperparathyroidism of renal origin: Secondary | ICD-10-CM | POA: Diagnosis not present

## 2014-02-19 DIAGNOSIS — D631 Anemia in chronic kidney disease: Secondary | ICD-10-CM | POA: Diagnosis not present

## 2014-02-19 DIAGNOSIS — Z992 Dependence on renal dialysis: Secondary | ICD-10-CM | POA: Diagnosis not present

## 2014-02-19 DIAGNOSIS — N186 End stage renal disease: Secondary | ICD-10-CM | POA: Diagnosis not present

## 2014-02-19 DIAGNOSIS — E611 Iron deficiency: Secondary | ICD-10-CM | POA: Diagnosis not present

## 2014-02-20 ENCOUNTER — Encounter: Payer: Self-pay | Admitting: Infectious Diseases

## 2014-02-20 ENCOUNTER — Ambulatory Visit (INDEPENDENT_AMBULATORY_CARE_PROVIDER_SITE_OTHER): Payer: Medicare Other | Admitting: Infectious Diseases

## 2014-02-20 ENCOUNTER — Other Ambulatory Visit: Payer: Self-pay | Admitting: *Deleted

## 2014-02-20 VITALS — BP 158/87 | HR 84 | Temp 99.9°F | Wt 119.0 lb

## 2014-02-20 DIAGNOSIS — N186 End stage renal disease: Secondary | ICD-10-CM

## 2014-02-20 DIAGNOSIS — B20 Human immunodeficiency virus [HIV] disease: Secondary | ICD-10-CM

## 2014-02-20 MED ORDER — FENTANYL 50 MCG/HR TD PT72: MEDICATED_PATCH | TRANSDERMAL | Status: DC

## 2014-02-20 NOTE — Telephone Encounter (Signed)
[facility] 

## 2014-02-20 NOTE — Assessment & Plan Note (Signed)
She appears to be doing well with the exception of having dropped her CD4 count. Will repeat that today and if still low she needs to be started on bactrim.  She has gotten flu vax. Will see her back in 6 months.  She needs CD4, RPR, HIV RNA, CMP, CBC 2 weeks prior to next visit (in 6 months).

## 2014-02-20 NOTE — Assessment & Plan Note (Signed)
Will f/u with renal, greatly appreciate partnering with them.

## 2014-02-20 NOTE — Progress Notes (Signed)
   Subjective:    Patient ID: [name], female    DOB: 1951/02/14, 63 y.o.   MRN: [id number]  HPI  63 yo F with HIV+, ESRD, staying at SNF. She is on ISN/AZT/3TC dosed for her HD. Her last CD4 was 130 and her HIV RNA was <20 (12-22-13). Her Ca was 6.6. H/H 9.9/29.2.   Has been taking ART well. No issues. Has some complaints about HD- nurses don't know how to stick her, make her wait, uncontrolled pain.  Continues to have episodes of n/v.   Review of Systems  Wt up 10# since August.  HD fistula hurts like it is on fire, thinks she is allergic to gauze.      Objective:   Physical Exam  Constitutional: She appears well-developed and well-nourished.  HENT:  Mouth/Throat: No oropharyngeal exudate.  Eyes: EOM are normal. Pupils are equal, round, and reactive to light.  Neck: Neck supple.  Cardiovascular: Normal rate, regular rhythm and normal heart sounds.   Pulmonary/Chest: Effort normal and breath sounds normal.  Abdominal: Soft. Bowel sounds are normal. She exhibits no distension. There is no tenderness.  Musculoskeletal:       Arms: Lymphadenopathy:    She has no cervical adenopathy.          Assessment & Plan:

## 2014-02-21 DIAGNOSIS — Z992 Dependence on renal dialysis: Secondary | ICD-10-CM | POA: Diagnosis not present

## 2014-02-21 DIAGNOSIS — N186 End stage renal disease: Secondary | ICD-10-CM | POA: Diagnosis not present

## 2014-02-21 DIAGNOSIS — D631 Anemia in chronic kidney disease: Secondary | ICD-10-CM | POA: Diagnosis not present

## 2014-02-21 DIAGNOSIS — E611 Iron deficiency: Secondary | ICD-10-CM | POA: Diagnosis not present

## 2014-02-21 DIAGNOSIS — N2581 Secondary hyperparathyroidism of renal origin: Secondary | ICD-10-CM | POA: Diagnosis not present

## 2014-02-21 LAB — T-HELPER CELL (CD4) - (RCID CLINIC ONLY)
CD4 T CELL HELPER: 20 % — AB (ref 33–55)
CD4 T Cell Abs: 300 /uL — ABNORMAL LOW (ref 400–2700)

## 2014-02-24 DIAGNOSIS — D631 Anemia in chronic kidney disease: Secondary | ICD-10-CM | POA: Diagnosis not present

## 2014-02-24 DIAGNOSIS — N186 End stage renal disease: Secondary | ICD-10-CM | POA: Diagnosis not present

## 2014-02-24 DIAGNOSIS — Z992 Dependence on renal dialysis: Secondary | ICD-10-CM | POA: Diagnosis not present

## 2014-02-24 DIAGNOSIS — N2581 Secondary hyperparathyroidism of renal origin: Secondary | ICD-10-CM | POA: Diagnosis not present

## 2014-02-24 DIAGNOSIS — E611 Iron deficiency: Secondary | ICD-10-CM | POA: Diagnosis not present

## 2014-02-28 DIAGNOSIS — D631 Anemia in chronic kidney disease: Secondary | ICD-10-CM | POA: Diagnosis not present

## 2014-02-28 DIAGNOSIS — Z992 Dependence on renal dialysis: Secondary | ICD-10-CM | POA: Diagnosis not present

## 2014-02-28 DIAGNOSIS — N2581 Secondary hyperparathyroidism of renal origin: Secondary | ICD-10-CM | POA: Diagnosis not present

## 2014-02-28 DIAGNOSIS — N186 End stage renal disease: Secondary | ICD-10-CM | POA: Diagnosis not present

## 2014-02-28 DIAGNOSIS — E611 Iron deficiency: Secondary | ICD-10-CM | POA: Diagnosis not present

## 2014-03-01 DIAGNOSIS — Z992 Dependence on renal dialysis: Secondary | ICD-10-CM | POA: Diagnosis not present

## 2014-03-01 DIAGNOSIS — N186 End stage renal disease: Secondary | ICD-10-CM | POA: Diagnosis not present

## 2014-03-02 DIAGNOSIS — N2581 Secondary hyperparathyroidism of renal origin: Secondary | ICD-10-CM | POA: Diagnosis not present

## 2014-03-02 DIAGNOSIS — D509 Iron deficiency anemia, unspecified: Secondary | ICD-10-CM | POA: Diagnosis not present

## 2014-03-02 DIAGNOSIS — Z992 Dependence on renal dialysis: Secondary | ICD-10-CM | POA: Diagnosis not present

## 2014-03-02 DIAGNOSIS — D631 Anemia in chronic kidney disease: Secondary | ICD-10-CM | POA: Diagnosis not present

## 2014-03-02 DIAGNOSIS — N186 End stage renal disease: Secondary | ICD-10-CM | POA: Diagnosis not present

## 2014-03-05 DIAGNOSIS — N2581 Secondary hyperparathyroidism of renal origin: Secondary | ICD-10-CM | POA: Diagnosis not present

## 2014-03-05 DIAGNOSIS — D509 Iron deficiency anemia, unspecified: Secondary | ICD-10-CM | POA: Diagnosis not present

## 2014-03-05 DIAGNOSIS — Z992 Dependence on renal dialysis: Secondary | ICD-10-CM | POA: Diagnosis not present

## 2014-03-05 DIAGNOSIS — N186 End stage renal disease: Secondary | ICD-10-CM | POA: Diagnosis not present

## 2014-03-05 DIAGNOSIS — D631 Anemia in chronic kidney disease: Secondary | ICD-10-CM | POA: Diagnosis not present

## 2014-03-08 DIAGNOSIS — G894 Chronic pain syndrome: Secondary | ICD-10-CM | POA: Diagnosis not present

## 2014-03-12 DIAGNOSIS — Z992 Dependence on renal dialysis: Secondary | ICD-10-CM | POA: Diagnosis not present

## 2014-03-12 DIAGNOSIS — D631 Anemia in chronic kidney disease: Secondary | ICD-10-CM | POA: Diagnosis not present

## 2014-03-12 DIAGNOSIS — N186 End stage renal disease: Secondary | ICD-10-CM | POA: Diagnosis not present

## 2014-03-12 DIAGNOSIS — D509 Iron deficiency anemia, unspecified: Secondary | ICD-10-CM | POA: Diagnosis not present

## 2014-03-12 DIAGNOSIS — N2581 Secondary hyperparathyroidism of renal origin: Secondary | ICD-10-CM | POA: Diagnosis not present

## 2014-03-16 DIAGNOSIS — N186 End stage renal disease: Secondary | ICD-10-CM | POA: Diagnosis not present

## 2014-03-16 DIAGNOSIS — D631 Anemia in chronic kidney disease: Secondary | ICD-10-CM | POA: Diagnosis not present

## 2014-03-16 DIAGNOSIS — D509 Iron deficiency anemia, unspecified: Secondary | ICD-10-CM | POA: Diagnosis not present

## 2014-03-16 DIAGNOSIS — N2581 Secondary hyperparathyroidism of renal origin: Secondary | ICD-10-CM | POA: Diagnosis not present

## 2014-03-16 DIAGNOSIS — Z992 Dependence on renal dialysis: Secondary | ICD-10-CM | POA: Diagnosis not present

## 2014-03-19 DIAGNOSIS — D631 Anemia in chronic kidney disease: Secondary | ICD-10-CM | POA: Diagnosis not present

## 2014-03-19 DIAGNOSIS — D509 Iron deficiency anemia, unspecified: Secondary | ICD-10-CM | POA: Diagnosis not present

## 2014-03-19 DIAGNOSIS — N2581 Secondary hyperparathyroidism of renal origin: Secondary | ICD-10-CM | POA: Diagnosis not present

## 2014-03-19 DIAGNOSIS — Z992 Dependence on renal dialysis: Secondary | ICD-10-CM | POA: Diagnosis not present

## 2014-03-19 DIAGNOSIS — N186 End stage renal disease: Secondary | ICD-10-CM | POA: Diagnosis not present

## 2014-03-21 DIAGNOSIS — Z992 Dependence on renal dialysis: Secondary | ICD-10-CM | POA: Diagnosis not present

## 2014-03-21 DIAGNOSIS — N186 End stage renal disease: Secondary | ICD-10-CM | POA: Diagnosis not present

## 2014-03-21 DIAGNOSIS — D509 Iron deficiency anemia, unspecified: Secondary | ICD-10-CM | POA: Diagnosis not present

## 2014-03-21 DIAGNOSIS — D631 Anemia in chronic kidney disease: Secondary | ICD-10-CM | POA: Diagnosis not present

## 2014-03-21 DIAGNOSIS — N2581 Secondary hyperparathyroidism of renal origin: Secondary | ICD-10-CM | POA: Diagnosis not present

## 2014-03-23 ENCOUNTER — Emergency Department (HOSPITAL_COMMUNITY)
Admission: EM | Admit: 2014-03-23 | Discharge: 2014-03-23 | Disposition: A | Discharge status: Home / Self Care | Payer: Medicare Other | Source: Home / Self Care | Attending: Emergency Medicine | Admitting: Emergency Medicine

## 2014-03-23 ENCOUNTER — Emergency Department (HOSPITAL_COMMUNITY): Payer: Medicare Other

## 2014-03-23 ENCOUNTER — Encounter (HOSPITAL_COMMUNITY): Payer: Self-pay | Admitting: *Deleted

## 2014-03-23 DIAGNOSIS — Z792 Long term (current) use of antibiotics: Secondary | ICD-10-CM | POA: Insufficient documentation

## 2014-03-23 DIAGNOSIS — R2231 Localized swelling, mass and lump, right upper limb: Secondary | ICD-10-CM

## 2014-03-23 DIAGNOSIS — M7989 Other specified soft tissue disorders: Secondary | ICD-10-CM | POA: Diagnosis not present

## 2014-03-23 DIAGNOSIS — F329 Major depressive disorder, single episode, unspecified: Secondary | ICD-10-CM

## 2014-03-23 DIAGNOSIS — F419 Anxiety disorder, unspecified: Secondary | ICD-10-CM

## 2014-03-23 DIAGNOSIS — N186 End stage renal disease: Secondary | ICD-10-CM | POA: Diagnosis not present

## 2014-03-23 DIAGNOSIS — Z21 Asymptomatic human immunodeficiency virus [HIV] infection status: Secondary | ICD-10-CM

## 2014-03-23 DIAGNOSIS — R0602 Shortness of breath: Secondary | ICD-10-CM | POA: Diagnosis not present

## 2014-03-23 DIAGNOSIS — B20 Human immunodeficiency virus [HIV] disease: Secondary | ICD-10-CM | POA: Diagnosis not present

## 2014-03-23 DIAGNOSIS — Z87891 Personal history of nicotine dependence: Secondary | ICD-10-CM

## 2014-03-23 DIAGNOSIS — M79601 Pain in right arm: Secondary | ICD-10-CM | POA: Diagnosis not present

## 2014-03-23 DIAGNOSIS — Z880 Allergy status to penicillin: Secondary | ICD-10-CM | POA: Insufficient documentation

## 2014-03-23 DIAGNOSIS — Z862 Personal history of diseases of the blood and blood-forming organs and certain disorders involving the immune mechanism: Secondary | ICD-10-CM | POA: Insufficient documentation

## 2014-03-23 DIAGNOSIS — Z86010 Personal history of colonic polyps: Secondary | ICD-10-CM | POA: Insufficient documentation

## 2014-03-23 DIAGNOSIS — L03113 Cellulitis of right upper limb: Secondary | ICD-10-CM

## 2014-03-23 DIAGNOSIS — Z743 Need for continuous supervision: Secondary | ICD-10-CM | POA: Diagnosis not present

## 2014-03-23 DIAGNOSIS — Z992 Dependence on renal dialysis: Secondary | ICD-10-CM | POA: Insufficient documentation

## 2014-03-23 DIAGNOSIS — I517 Cardiomegaly: Secondary | ICD-10-CM | POA: Diagnosis not present

## 2014-03-23 DIAGNOSIS — Z8669 Personal history of other diseases of the nervous system and sense organs: Secondary | ICD-10-CM

## 2014-03-23 DIAGNOSIS — Z8619 Personal history of other infectious and parasitic diseases: Secondary | ICD-10-CM | POA: Insufficient documentation

## 2014-03-23 DIAGNOSIS — I509 Heart failure, unspecified: Secondary | ICD-10-CM | POA: Diagnosis not present

## 2014-03-23 DIAGNOSIS — R609 Edema, unspecified: Secondary | ICD-10-CM

## 2014-03-23 DIAGNOSIS — R030 Elevated blood-pressure reading, without diagnosis of hypertension: Secondary | ICD-10-CM | POA: Diagnosis not present

## 2014-03-23 DIAGNOSIS — R279 Unspecified lack of coordination: Secondary | ICD-10-CM | POA: Diagnosis not present

## 2014-03-23 DIAGNOSIS — Z79899 Other long term (current) drug therapy: Secondary | ICD-10-CM | POA: Insufficient documentation

## 2014-03-23 DIAGNOSIS — I120 Hypertensive chronic kidney disease with stage 5 chronic kidney disease or end stage renal disease: Secondary | ICD-10-CM

## 2014-03-23 DIAGNOSIS — I272 Other secondary pulmonary hypertension: Secondary | ICD-10-CM | POA: Diagnosis not present

## 2014-03-23 LAB — BASIC METABOLIC PANEL
Anion gap: 11 (ref 5–15)
BUN: 43 mg/dL — ABNORMAL HIGH (ref 6–23)
CHLORIDE: 103 mmol/L (ref 96–112)
CO2: 28 mmol/L (ref 19–32)
CREATININE: 10.74 mg/dL — AB (ref 0.50–1.10)
Calcium: 9 mg/dL (ref 8.4–10.5)
GFR calc non Af Amer: 3 mL/min — ABNORMAL LOW (ref >90)
GFR, EST AFRICAN AMERICAN: 4 mL/min — AB (ref >90)
Glucose, Bld: 78 mg/dL (ref 70–99)
Potassium: 3.8 mmol/L (ref 3.5–5.1)
Sodium: 142 mmol/L (ref 135–145)

## 2014-03-23 LAB — CBC WITH DIFFERENTIAL/PLATELET
BASOS PCT: 0 % (ref 0–1)
Basophils Absolute: 0 10*3/uL (ref 0.0–0.1)
EOS PCT: 4 % (ref 0–5)
Eosinophils Absolute: 0.1 10*3/uL (ref 0.0–0.7)
HEMATOCRIT: 27.7 % — AB (ref 36.0–46.0)
HEMOGLOBIN: 8.7 g/dL — AB (ref 12.0–15.0)
LYMPHS PCT: 30 % (ref 12–46)
Lymphs Abs: 0.9 10*3/uL (ref 0.7–4.0)
MCH: 33.3 pg (ref 26.0–34.0)
MCHC: 31.4 g/dL (ref 30.0–36.0)
MCV: 106.1 fL — AB (ref 78.0–100.0)
Monocytes Absolute: 0.2 10*3/uL (ref 0.1–1.0)
Monocytes Relative: 6 % (ref 3–12)
NEUTROS ABS: 1.7 10*3/uL (ref 1.7–7.7)
NEUTROS PCT: 60 % (ref 43–77)
PLATELETS: 112 10*3/uL — AB (ref 150–400)
RBC: 2.61 MIL/uL — AB (ref 3.87–5.11)
RDW: 15.3 % (ref 11.5–15.5)
WBC: 2.9 10*3/uL — ABNORMAL LOW (ref 4.0–10.5)

## 2014-03-23 IMAGING — US US EXTREM  UP VENOUS*R*
1 series · 13 of 24 positions shown · non-contrast
Comparison: None.

CLINICAL DATA: Right arm pain and swelling at level low forearm.
Erythema also present overlying a dialysis fistula.



[Series 1: us extrem up venous*right* · 0.05mm/px · 13 of 121 slices shown]
[im 1/121]
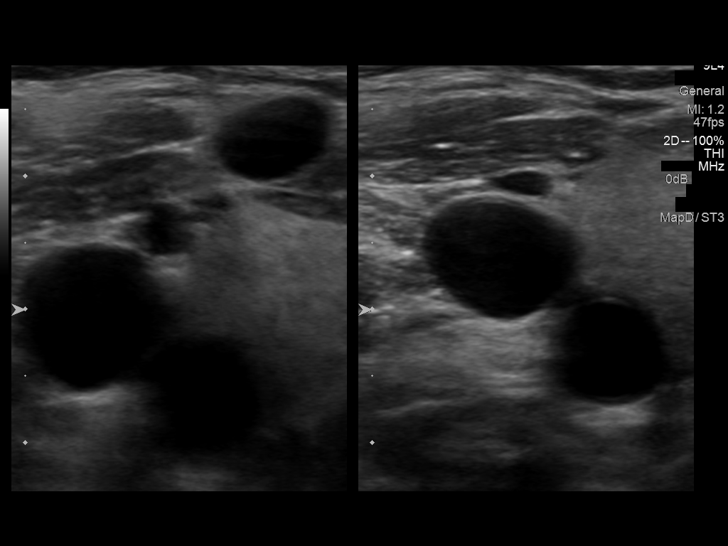
[im 11/121]
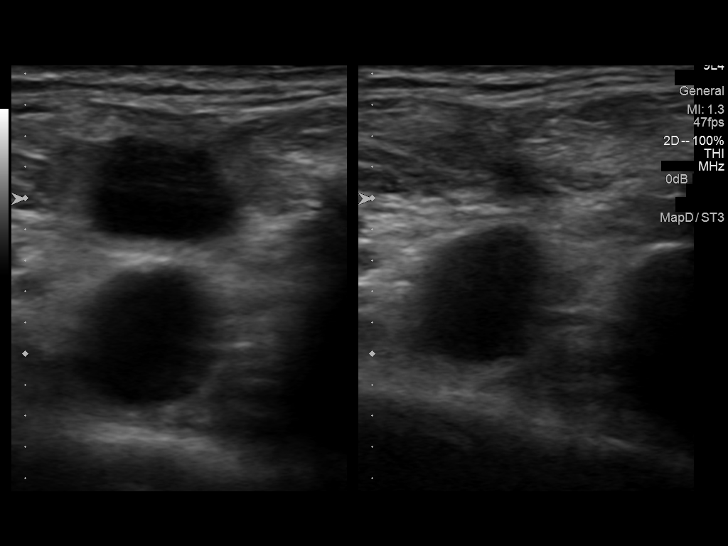
[im 21/121]
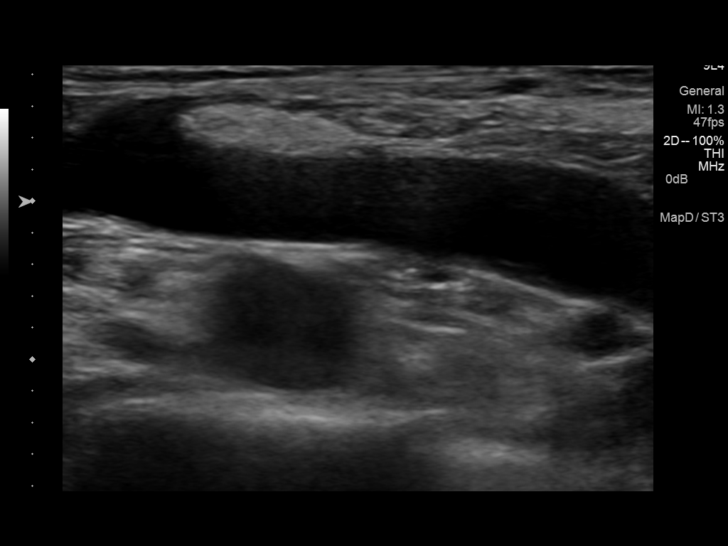
[im 32/121]
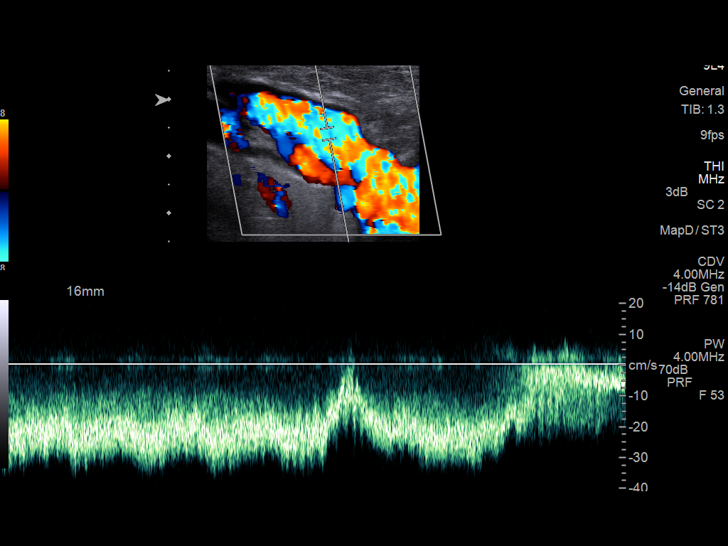
[im 42/121]
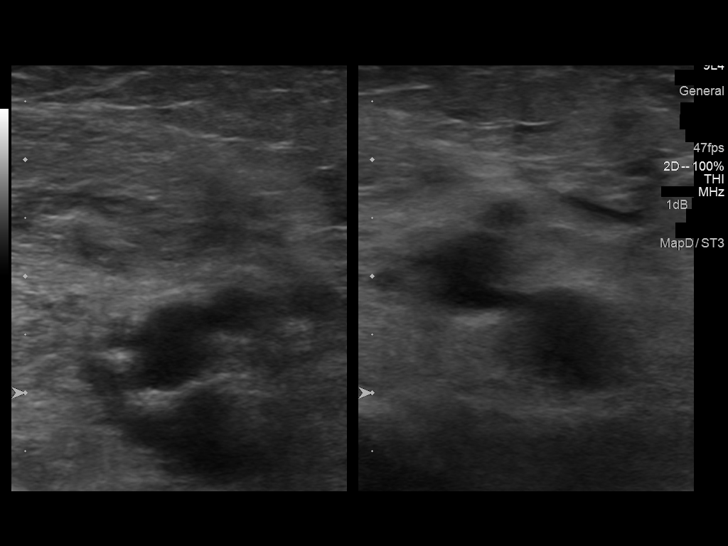
[im 53/121]
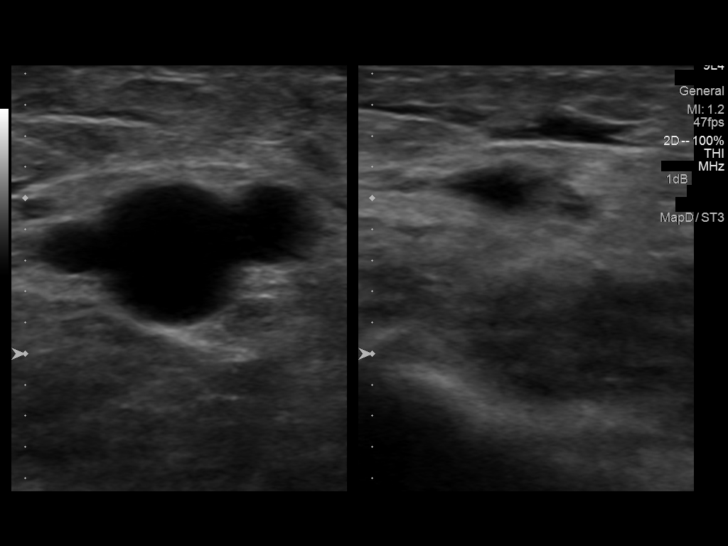
[im 63/121]
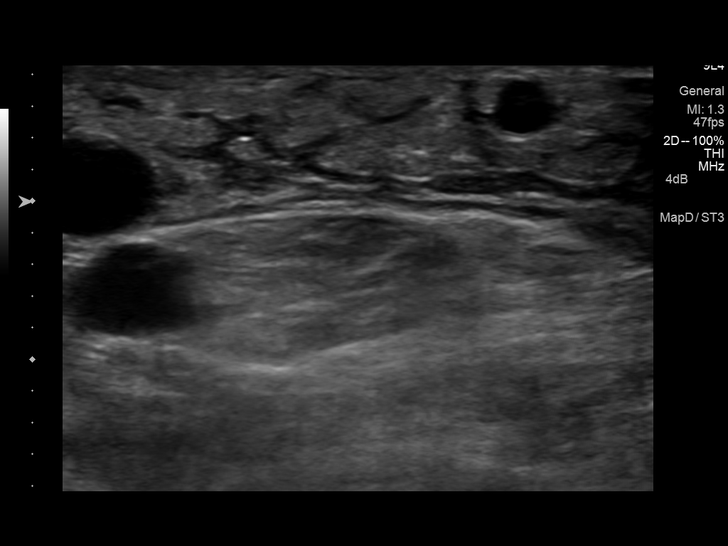
[im 68/121]
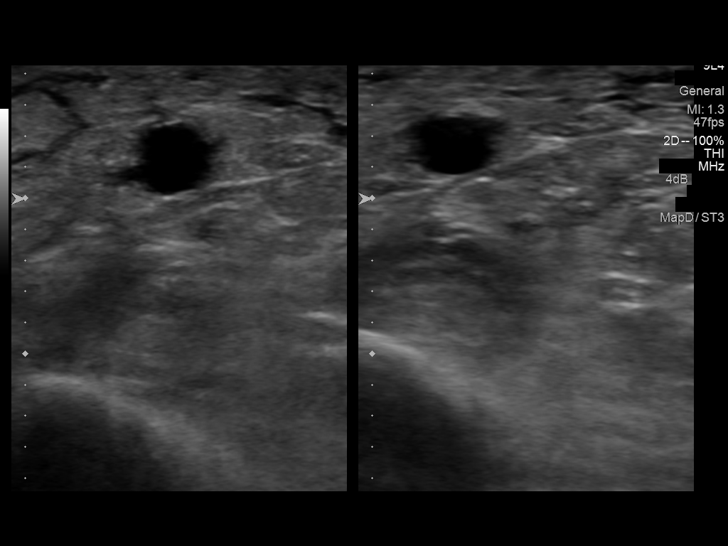
[im 79/121]
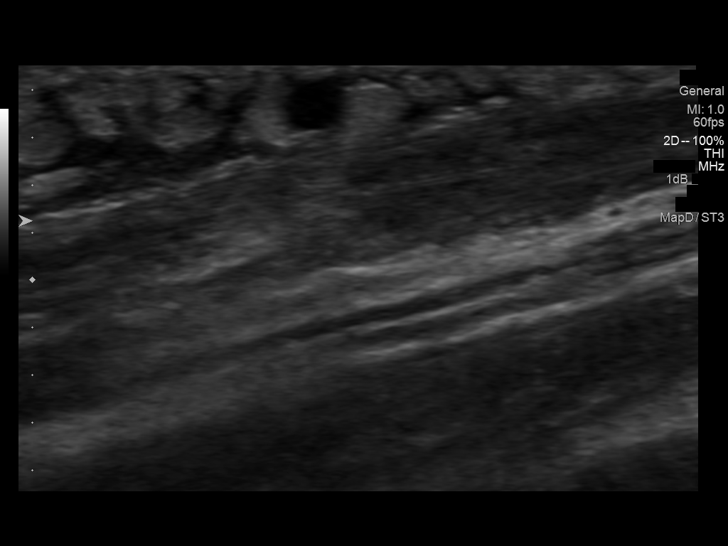
[im 89/121]
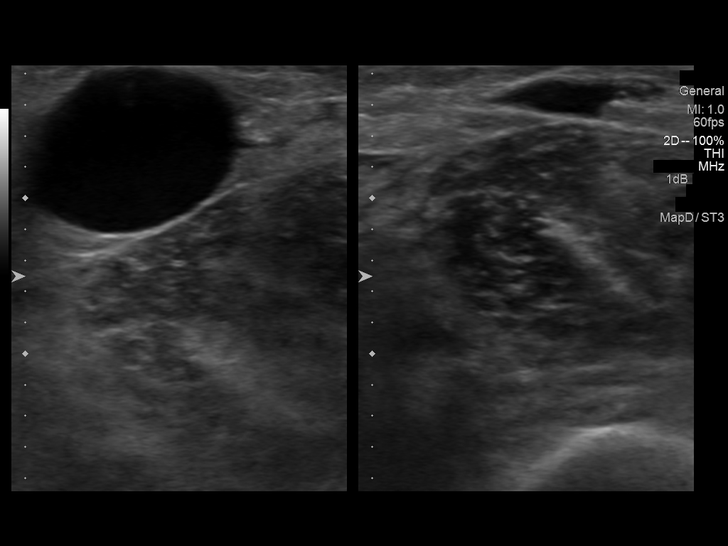
[im 100/121]
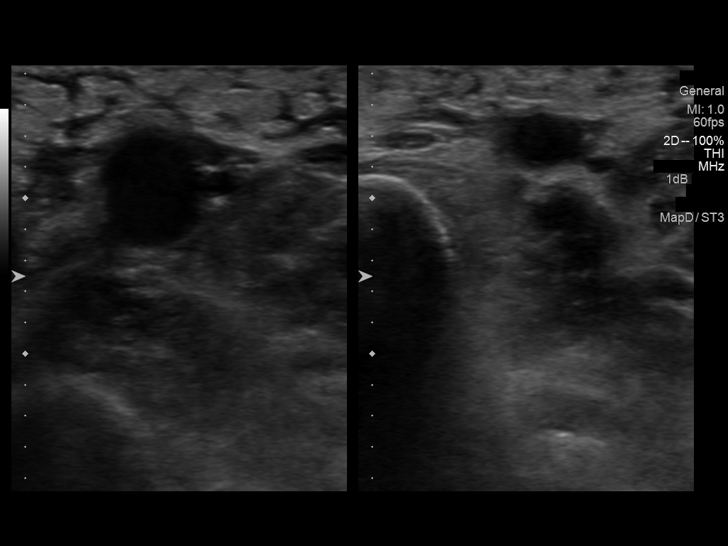
[im 110/121]
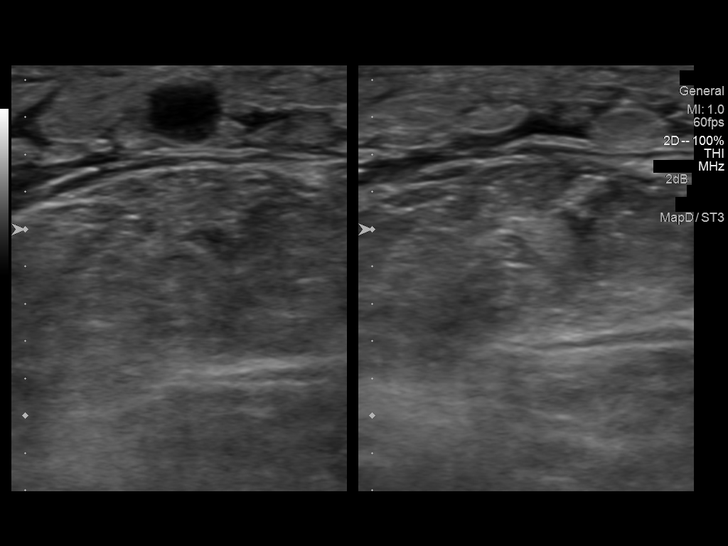
[im 121/121]
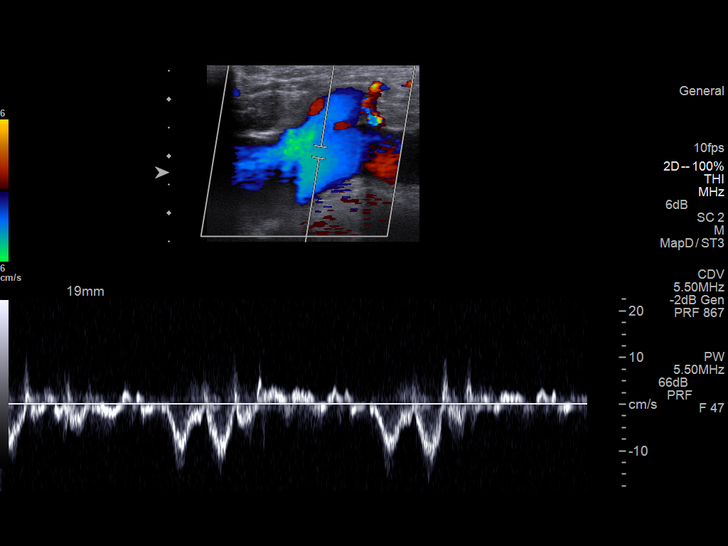

[13 of 24 positions shown; findings below may reference images not displayed]

FINDINGS: Contralateral Subclavian Vein: Respiratory phasicity is normal and
symmetric with the symptomatic side. No evidence of thrombus. Normal
compressibility.

Internal Jugular Vein: No evidence of thrombus. Normal
compressibility, respiratory phasicity and response to augmentation.

Subclavian Vein: No evidence of thrombus. Normal compressibility,
respiratory phasicity and response to augmentation.

Axillary Vein: No evidence of thrombus. Normal compressibility,
respiratory phasicity and response to augmentation.

Cephalic Vein: The cephalic vein is enlarged and contains prominent
flow, reflecting underlying TRIERWEILER fistula.

Basilic Vein: No evidence of thrombus. Normal compressibility,
respiratory phasicity and response to augmentation.

Brachial Veins: No evidence of thrombus. Normal compressibility,
respiratory phasicity and response to augmentation.

Radial Veins: No evidence of thrombus. Normal compressibility,
respiratory phasicity and response to augmentation.

Ulnar Veins: No evidence of thrombus. Normal compressibility,
respiratory phasicity and response to augmentation.

Venous Reflux:  None visualized.

Other Findings: Subcutaneous edema is present involving nearly the
entire visualized forearm. No focal fluid collections are
identified.
IMPRESSION: No evidence of right upper extremity deep venous thrombosis. Open
radiocephalic fistula.

## 2014-03-23 MED ORDER — CIPROFLOXACIN HCL 250 MG PO TABS
500.0000 mg | ORAL_TABLET | Freq: Once | ORAL | Status: AC
  Administered 2014-03-23: 500 mg via ORAL
  Filled 2014-03-23: qty 2

## 2014-03-23 MED ORDER — VANCOMYCIN HCL IN DEXTROSE 1-5 GM/200ML-% IV SOLN
1000.0000 mg | Freq: Once | INTRAVENOUS | Status: AC
  Administered 2014-03-23: 1000 mg via INTRAVENOUS
  Filled 2014-03-23: qty 200

## 2014-03-23 MED ORDER — CIPROFLOXACIN HCL 500 MG PO TABS: 500.0000 mg | ORAL_TABLET | Freq: Two times a day (BID) | ORAL | Status: DC

## 2014-03-23 MED ORDER — HYDROCODONE-ACETAMINOPHEN 5-325 MG PO TABS
1.0000 | ORAL_TABLET | Freq: Once | ORAL | Status: AC
  Administered 2014-03-23: 1 via ORAL
  Filled 2014-03-23: qty 1

## 2014-03-23 NOTE — ED Notes (Signed)
Patient from [facility], states after dialysis on Wednesday, arm was swollen, swelling decreased Thursday, but is worse today and is painful;

## 2014-03-23 NOTE — ED Notes (Signed)
Pt request pain med for arm pain. EDP aware

## 2014-03-23 NOTE — ED Notes (Signed)
US at the bedside

## 2014-03-23 NOTE — ED Notes (Signed)
Patient given discharge instruction, verbalized understand. IV removed, band aid applied. Patient on stretcher out of the department.

## 2014-03-23 NOTE — ED Notes (Signed)
EMS here to transport pt. 

## 2014-03-23 NOTE — Discharge Instructions (Signed)
Follow up to get dialysis Sunday

## 2014-03-23 NOTE — ED Notes (Signed)
Right arm dialysis fistula scabbed, red

## 2014-03-23 NOTE — ED Notes (Signed)
EMS called to transport pt back to [facility]

## 2014-03-23 NOTE — ED Provider Notes (Signed)
CSN: [id number]     Arrival date & time 03/23/14  1033 History   First MD Initiated Contact with Patient 03/23/14 1052     Chief Complaint  Patient presents with  . Arm Swelling     (Consider location/radiation/quality/duration/timing/severity/associated sxs/prior Treatment) Patient is a 64 y.o. female presenting with extremity weakness. The history is provided by the patient (the pt complains of swelling and pain to right arm for 2 days.  she has a dialysis graft in it).  Extremity Weakness This is a new problem. The current episode started 2 days ago. The problem occurs constantly. The problem has been gradually worsening. Pertinent negatives include no chest pain, no abdominal pain and no headaches. Exacerbated by: movement. Nothing relieves the symptoms.    Past Medical History  Diagnosis Date  . Pulmonary HTN   . Hypertension   . Depression   . Insomnia   . Chronic diarrhea   . HIV (human immunodeficiency virus infection)   . C. difficile colitis 10/10/11  . Muscle weakness (generalized)   . Intracranial hemorrhage   . Anxiety   . Anemia   . ESRD on hemodialysis    Past Surgical History  Procedure Laterality Date  . Dialysis shunts      previous one removed from left arm and now present one in right arm  . Esophagogastroduodenoscopy  12/15/2010    Procedure: ESOPHAGOGASTRODUODENOSCOPY (EGD);  Surgeon: Daneil Dolin, MD;  Location: [facility] ENDO SUITE;  Service: Endoscopy;  Laterality: N/A;  . Tonsillectomy    . Biopsy thyroid    . Excision of breast biopsy Right 05/04/2012    Procedure: EXCISION OF BREAST BIOPSY;  Surgeon: Donato Heinz, MD;  Location: [facility] ORS;  Service: General;  Laterality: Right;  Right Excisional Breast Biopsy  . Shuntogram Right 10/19/2013    Procedure: FISTULOGRAM;  Surgeon: Conrad Darbydale, MD;  Location: [facility] CATH LAB;  Service: Cardiovascular;  Laterality: Right;   Family History  Problem Relation Age of Onset  . Anesthesia problems Neg Hx   .  Hypotension Neg Hx   . Malignant hyperthermia Neg Hx   . Pseudochol deficiency Neg Hx    History  Substance Use Topics  . Smoking status: Former Research scientist (life sciences)  . Smokeless tobacco: Never Used  . Alcohol Use: No   OB History    Gravida Para Term Preterm AB TAB SAB Ectopic Multiple Living   1 1 1       1      Review of Systems  Constitutional: Negative for appetite change and fatigue.  HENT: Negative for congestion, ear discharge and sinus pressure.   Eyes: Negative for discharge.  Respiratory: Negative for cough.   Cardiovascular: Negative for chest pain.  Gastrointestinal: Negative for abdominal pain and diarrhea.  Genitourinary: Negative for frequency and hematuria.  Musculoskeletal: Positive for extremity weakness. Negative for back pain.       Pain right arm and swelling  Skin: Negative for rash.  Neurological: Negative for seizures and headaches.  Psychiatric/Behavioral: Negative for hallucinations.      Allergies  Penicillins and Latex  Home Medications   Prior to Admission medications   Medication Sig Start Date End Date Taking? Authorizing Provider  buPROPion (WELLBUTRIN XL) 150 MG 24 hr tablet Take 150 mg by mouth daily.   Yes Historical Provider, MD  diltiazem (CARDIZEM) 60 MG tablet Take 60 mg by mouth every 8 (eight) hours.   Yes Historical Provider, MD  DULoxetine (CYMBALTA) 30 MG capsule Take 30 mg  by mouth daily. Take with 60mg  capsule for total of 90 mg.   Yes Historical Provider, MD  DULoxetine (CYMBALTA) 60 MG capsule Take 60 mg by mouth daily. Take with 30mg  capsule for a total of 90mg    Yes Historical Provider, MD  hydroxypropyl methylcellulose (ISOPTO TEARS) 2.5 % ophthalmic solution Place 1 drop into both eyes 2 (two) times daily.     Yes Historical Provider, MD  lamivudine (EPIVIR) 100 MG tablet Take 0.5 tablets (50 mg total) by mouth daily. Take one half tablet daily 06/11/10  Yes Campbell Riches, MD  metoprolol (LOPRESSOR) 50 MG tablet Take 50 mg by mouth  2 (two) times daily.   Yes Historical Provider, MD  multivitamin (RENA-VIT) TABS tablet Take 1 tablet by mouth at bedtime.   Yes Historical Provider, MD  omeprazole (PRILOSEC) 20 MG capsule Take 40 mg by mouth daily.   Yes Historical Provider, MD  ondansetron (ZOFRAN) 4 MG tablet Take 2 mg by mouth 3 (three) times daily. nausea   Yes Historical Provider, MD  oxyCODONE (OXY IR/ROXICODONE) 5 MG immediate release tablet Take 5 mg by mouth every 6 (six) hours as needed for moderate pain.    Yes Historical Provider, MD  oxycodone (OXY-IR) 5 MG capsule Take 5 mg by mouth at bedtime.   Yes Historical Provider, MD  raltegravir (ISENTRESS) 400 MG tablet Take 400 mg by mouth 2 (two) times daily.    Yes Historical Provider, MD  sevelamer carbonate (RENVELA) 800 MG tablet Take 800-1,600 mg by mouth 3 (three) times daily with meals.    Yes Historical Provider, MD  sildenafil (REVATIO) 20 MG tablet Take 20 mg by mouth 3 (three) times daily.    Yes Historical Provider, MD  vitamin C (ASCORBIC ACID) 500 MG tablet Take 500 mg by mouth 2 (two) times daily.   Yes Historical Provider, MD  zaleplon (SONATA) 5 MG capsule Take 5 mg by mouth at bedtime. 02/20/14  Yes Historical Provider, MD  zidovudine (RETROVIR) 100 MG capsule Take 100 mg by mouth 3 (three) times daily.     Yes Historical Provider, MD  ciprofloxacin (CIPRO) 500 MG tablet Take 1 tablet (500 mg total) by mouth 2 (two) times daily. One po bid x 7 days 03/23/14   Maudry Diego, MD  epoetin alfa (EPOGEN,PROCRIT) 09811 UNIT/ML injection Inject 8,000 Units into the skin 3 (three) times a week. To be given at dialysis, Monday Wednesday and Friday    Historical Provider, MD  fentaNYL (DURAGESIC - DOSED MCG/HR) 50 MCG/HR Apply one patch every 72 hours for pain. Remove old patch. External use only. Rotate sites. 02/20/14   Estill Dooms, MD  ipratropium-albuterol (DUONEB) 0.5-2.5 (3) MG/3ML SOLN Take 3 mLs by nebulization every 6 (six) hours as needed. For shortness  of breath/ wheezing    Historical Provider, MD  lidocaine-prilocaine (EMLA) cream Apply 1 application topically 3 (three) times a week. Apply 1 hour prior to dialysis access site on Monday Wednesday and Friday    Historical Provider, MD  LORazepam (ATIVAN) 0.5 MG tablet Take 0.25 mg by mouth every 6 (six) hours as needed for anxiety. 09/08/13   Tiffany L Reed, DO   BP 159/80 mmHg  Pulse 72  Temp(Src) 98.5 F (36.9 C) (Oral)  Resp 19  Ht 5\' 2"  (1.575 m)  Wt 120 lb (54.432 kg)  BMI 21.94 kg/m2  SpO2 100% Physical Exam  Constitutional: She is oriented to person, place, and time. She appears well-developed.  HENT:  Head: Normocephalic.  Eyes: Conjunctivae and EOM are normal. No scleral icterus.  Neck: Neck supple. No thyromegaly present.  Cardiovascular: Normal rate and regular rhythm.  Exam reveals no gallop and no friction rub.   No murmur heard. Pulmonary/Chest: No stridor. She has no wheezes. She has no rales. She exhibits no tenderness.  Abdominal: She exhibits no distension. There is no tenderness. There is no rebound.  Musculoskeletal: She exhibits edema.  Tenderness and edema to right arm  Lymphadenopathy:    She has no cervical adenopathy.  Neurological: She is oriented to person, place, and time. She exhibits normal muscle tone. Coordination normal.  Skin: No rash noted. No erythema.  Psychiatric: She has a normal mood and affect. Her behavior is normal.    ED Course  Procedures (including critical care time) Labs Review Labs Reviewed  CBC WITH DIFFERENTIAL - Abnormal; Notable for the following:    WBC 2.9 (*)    RBC 2.61 (*)    Hemoglobin 8.7 (*)    HCT 27.7 (*)    MCV 106.1 (*)    Platelets 112 (*)    All other components within normal limits  BASIC METABOLIC PANEL - Abnormal; Notable for the following:    BUN 43 (*)    Creatinine, Ser 10.74 (*)    GFR calc non Af Amer 3 (*)    GFR calc Af Amer 4 (*)    All other components within normal limits    Imaging  Review US Venous Img Upper Uni Right  03/23/2014   CLINICAL DATA:  Right arm pain and swelling at level low forearm. Erythema also present overlying a dialysis fistula.  EXAM: RIGHT UPPER EXTREMITY VENOUS DOPPLER ULTRASOUND  TECHNIQUE: Gray-scale sonography with graded compression, as well as color Doppler and duplex ultrasound were performed to evaluate the upper extremity deep venous system from the level of the subclavian vein and including the jugular, axillary, basilic, radial, ulnar and upper cephalic vein. Spectral Doppler was utilized to evaluate flow at rest and with distal augmentation maneuvers.  COMPARISON:  None.  FINDINGS: Contralateral Subclavian Vein: Respiratory phasicity is normal and symmetric with the symptomatic side. No evidence of thrombus. Normal compressibility.  Internal Jugular Vein: No evidence of thrombus. Normal compressibility, respiratory phasicity and response to augmentation.  Subclavian Vein: No evidence of thrombus. Normal compressibility, respiratory phasicity and response to augmentation.  Axillary Vein: No evidence of thrombus. Normal compressibility, respiratory phasicity and response to augmentation.  Cephalic Vein: The cephalic vein is enlarged and contains prominent flow, reflecting underlying Cimino fistula.  Basilic Vein: No evidence of thrombus. Normal compressibility, respiratory phasicity and response to augmentation.  Brachial Veins: No evidence of thrombus. Normal compressibility, respiratory phasicity and response to augmentation.  Radial Veins: No evidence of thrombus. Normal compressibility, respiratory phasicity and response to augmentation.  Ulnar Veins: No evidence of thrombus. Normal compressibility, respiratory phasicity and response to augmentation.  Venous Reflux:  None visualized.  Other Findings: Subcutaneous edema is present involving nearly the entire visualized forearm. No focal fluid collections are identified.  IMPRESSION: No evidence of right  upper extremity deep venous thrombosis. Open radiocephalic fistula.   Electronically Signed   By: Aletta Edouard M.D.   On: 03/23/2014 13:26     EKG Interpretation None      MDM   Final diagnoses:  Swelling  Cellulitis of right upper extremity    [place] neg for dvt,  Graft open.  Possible cellulitus,  Spoke with renal,  Will give vanc iv  and cipro po.   Pt to have dialysis in 2 days    Maudry Diego, MD 03/23/14 1425

## 2014-03-25 ENCOUNTER — Encounter (HOSPITAL_COMMUNITY): Payer: Self-pay | Admitting: Emergency Medicine

## 2014-03-25 ENCOUNTER — Emergency Department (HOSPITAL_COMMUNITY): Payer: Medicare Other

## 2014-03-25 ENCOUNTER — Inpatient Hospital Stay (HOSPITAL_COMMUNITY)
Admission: EM | Admit: 2014-03-25 | Discharge: 2014-03-29 | DRG: 981 | Disposition: A | Discharge status: Skilled Nursing Facility | Payer: Medicare Other | Attending: Family Medicine | Admitting: Family Medicine

## 2014-03-25 DIAGNOSIS — M7989 Other specified soft tissue disorders: Secondary | ICD-10-CM | POA: Diagnosis not present

## 2014-03-25 DIAGNOSIS — Z21 Asymptomatic human immunodeficiency virus [HIV] infection status: Secondary | ICD-10-CM | POA: Diagnosis not present

## 2014-03-25 DIAGNOSIS — B20 Human immunodeficiency virus [HIV] disease: Secondary | ICD-10-CM | POA: Diagnosis present

## 2014-03-25 DIAGNOSIS — K000 Anodontia: Secondary | ICD-10-CM | POA: Diagnosis present

## 2014-03-25 DIAGNOSIS — L98499 Non-pressure chronic ulcer of skin of other sites with unspecified severity: Secondary | ICD-10-CM | POA: Diagnosis present

## 2014-03-25 DIAGNOSIS — D61818 Other pancytopenia: Secondary | ICD-10-CM | POA: Diagnosis not present

## 2014-03-25 DIAGNOSIS — I272 Other secondary pulmonary hypertension: Secondary | ICD-10-CM | POA: Diagnosis not present

## 2014-03-25 DIAGNOSIS — Z880 Allergy status to penicillin: Secondary | ICD-10-CM

## 2014-03-25 DIAGNOSIS — I10 Essential (primary) hypertension: Secondary | ICD-10-CM | POA: Diagnosis not present

## 2014-03-25 DIAGNOSIS — Z87891 Personal history of nicotine dependence: Secondary | ICD-10-CM

## 2014-03-25 DIAGNOSIS — R0602 Shortness of breath: Secondary | ICD-10-CM | POA: Diagnosis not present

## 2014-03-25 DIAGNOSIS — F418 Other specified anxiety disorders: Secondary | ICD-10-CM | POA: Diagnosis present

## 2014-03-25 DIAGNOSIS — N186 End stage renal disease: Secondary | ICD-10-CM

## 2014-03-25 DIAGNOSIS — Z91048 Other nonmedicinal substance allergy status: Secondary | ICD-10-CM | POA: Diagnosis not present

## 2014-03-25 DIAGNOSIS — D649 Anemia, unspecified: Secondary | ICD-10-CM | POA: Diagnosis not present

## 2014-03-25 DIAGNOSIS — Z992 Dependence on renal dialysis: Secondary | ICD-10-CM | POA: Diagnosis not present

## 2014-03-25 DIAGNOSIS — T827XXA Infection and inflammatory reaction due to other cardiac and vascular devices, implants and grafts, initial encounter: Secondary | ICD-10-CM | POA: Diagnosis not present

## 2014-03-25 DIAGNOSIS — I517 Cardiomegaly: Secondary | ICD-10-CM | POA: Diagnosis not present

## 2014-03-25 DIAGNOSIS — I871 Compression of vein: Secondary | ICD-10-CM | POA: Diagnosis present

## 2014-03-25 DIAGNOSIS — L03113 Cellulitis of right upper limb: Secondary | ICD-10-CM | POA: Diagnosis present

## 2014-03-25 DIAGNOSIS — I120 Hypertensive chronic kidney disease with stage 5 chronic kidney disease or end stage renal disease: Secondary | ICD-10-CM | POA: Diagnosis present

## 2014-03-25 DIAGNOSIS — G8929 Other chronic pain: Secondary | ICD-10-CM | POA: Diagnosis present

## 2014-03-25 DIAGNOSIS — T827XXD Infection and inflammatory reaction due to other cardiac and vascular devices, implants and grafts, subsequent encounter: Secondary | ICD-10-CM | POA: Diagnosis not present

## 2014-03-25 DIAGNOSIS — R030 Elevated blood-pressure reading, without diagnosis of hypertension: Secondary | ICD-10-CM | POA: Diagnosis present

## 2014-03-25 DIAGNOSIS — I509 Heart failure, unspecified: Secondary | ICD-10-CM | POA: Diagnosis present

## 2014-03-25 DIAGNOSIS — Z79899 Other long term (current) drug therapy: Secondary | ICD-10-CM

## 2014-03-25 DIAGNOSIS — Z79891 Long term (current) use of opiate analgesic: Secondary | ICD-10-CM | POA: Diagnosis not present

## 2014-03-25 DIAGNOSIS — L0390 Cellulitis, unspecified: Secondary | ICD-10-CM | POA: Diagnosis not present

## 2014-03-25 DIAGNOSIS — I770 Arteriovenous fistula, acquired: Secondary | ICD-10-CM | POA: Diagnosis not present

## 2014-03-25 DIAGNOSIS — R234 Changes in skin texture: Secondary | ICD-10-CM | POA: Diagnosis present

## 2014-03-25 DIAGNOSIS — K210 Gastro-esophageal reflux disease with esophagitis: Secondary | ICD-10-CM | POA: Diagnosis present

## 2014-03-25 DIAGNOSIS — T82898A Other specified complication of vascular prosthetic devices, implants and grafts, initial encounter: Secondary | ICD-10-CM | POA: Diagnosis not present

## 2014-03-25 LAB — I-STAT CHEM 8, ED
BUN: 51 mg/dL — ABNORMAL HIGH (ref 6–23)
CHLORIDE: 99 mmol/L (ref 96–112)
Calcium, Ion: 0.97 mmol/L — ABNORMAL LOW (ref 1.13–1.30)
Creatinine, Ser: 13.8 mg/dL — ABNORMAL HIGH (ref 0.50–1.10)
Glucose, Bld: 98 mg/dL (ref 70–99)
HEMATOCRIT: 31 % — AB (ref 36.0–46.0)
Hemoglobin: 10.5 g/dL — ABNORMAL LOW (ref 12.0–15.0)
Potassium: 4.2 mmol/L (ref 3.5–5.1)
Sodium: 141 mmol/L (ref 135–145)
TCO2: 22 mmol/L (ref 0–100)

## 2014-03-25 LAB — CBC WITH DIFFERENTIAL/PLATELET
BASOS PCT: 0 % (ref 0–1)
Basophils Absolute: 0 10*3/uL (ref 0.0–0.1)
EOS PCT: 5 % (ref 0–5)
Eosinophils Absolute: 0.1 10*3/uL (ref 0.0–0.7)
HEMATOCRIT: 28.4 % — AB (ref 36.0–46.0)
Hemoglobin: 8.9 g/dL — ABNORMAL LOW (ref 12.0–15.0)
Lymphocytes Relative: 27 % (ref 12–46)
Lymphs Abs: 0.8 10*3/uL (ref 0.7–4.0)
MCH: 31.8 pg (ref 26.0–34.0)
MCHC: 31.3 g/dL (ref 30.0–36.0)
MCV: 101.4 fL — ABNORMAL HIGH (ref 78.0–100.0)
MONOS PCT: 10 % (ref 3–12)
Monocytes Absolute: 0.3 10*3/uL (ref 0.1–1.0)
NEUTROS ABS: 1.6 10*3/uL — AB (ref 1.7–7.7)
NEUTROS PCT: 58 % (ref 43–77)
Platelets: 91 10*3/uL — ABNORMAL LOW (ref 150–400)
RBC: 2.8 MIL/uL — AB (ref 3.87–5.11)
RDW: 14.9 % (ref 11.5–15.5)
WBC: 2.8 10*3/uL — AB (ref 4.0–10.5)

## 2014-03-25 LAB — COMPREHENSIVE METABOLIC PANEL
ALT: 9 U/L (ref 0–35)
ANION GAP: 17 — AB (ref 5–15)
AST: 18 U/L (ref 0–37)
Albumin: 3.1 g/dL — ABNORMAL LOW (ref 3.5–5.2)
Alkaline Phosphatase: 92 U/L (ref 39–117)
BUN: 57 mg/dL — ABNORMAL HIGH (ref 6–23)
CALCIUM: 8.6 mg/dL (ref 8.4–10.5)
CHLORIDE: 98 mmol/L (ref 96–112)
CO2: 26 mmol/L (ref 19–32)
Creatinine, Ser: 15.01 mg/dL — ABNORMAL HIGH (ref 0.50–1.10)
GFR calc Af Amer: 3 mL/min — ABNORMAL LOW (ref >90)
GFR, EST NON AFRICAN AMERICAN: 2 mL/min — AB (ref >90)
Glucose, Bld: 99 mg/dL (ref 70–99)
Potassium: 4.3 mmol/L (ref 3.5–5.1)
Sodium: 141 mmol/L (ref 135–145)
TOTAL PROTEIN: 7.2 g/dL (ref 6.0–8.3)
Total Bilirubin: 0.7 mg/dL (ref 0.3–1.2)

## 2014-03-25 LAB — I-STAT TROPONIN, ED: TROPONIN I, POC: 0.05 ng/mL (ref 0.00–0.08)

## 2014-03-25 LAB — I-STAT CG4 LACTIC ACID, ED: Lactic Acid, Venous: 1.81 mmol/L (ref 0.5–2.0)

## 2014-03-25 IMAGING — DX DG CHEST 2V
2 series · 2 of 2 positions shown · non-contrast
Comparison: [DATE]

CLINICAL DATA: Shortness of breath. Hypertension and pulmonary
hypertension.

EXAM:
CHEST  2 VIEW

[chest lat]
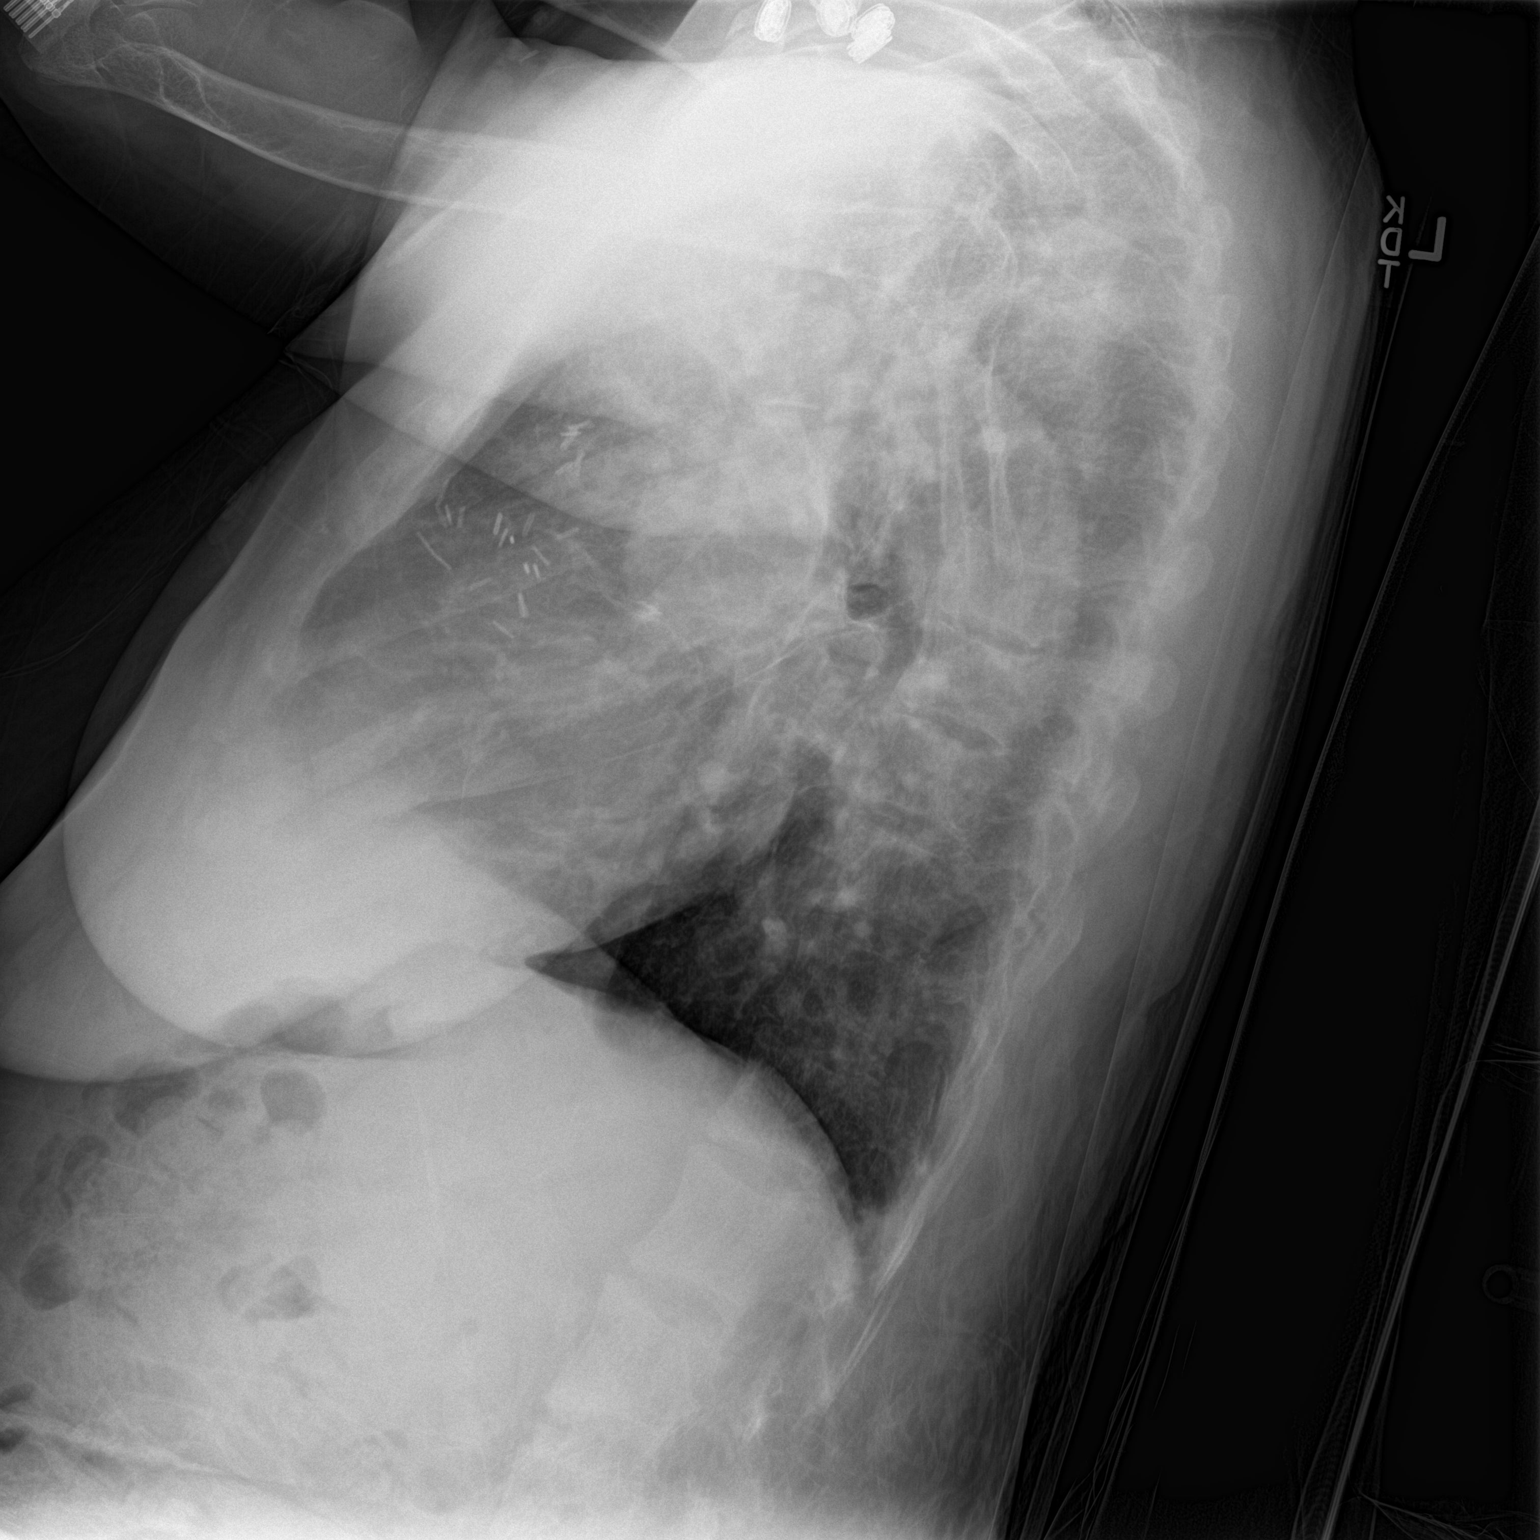

[chest ap]
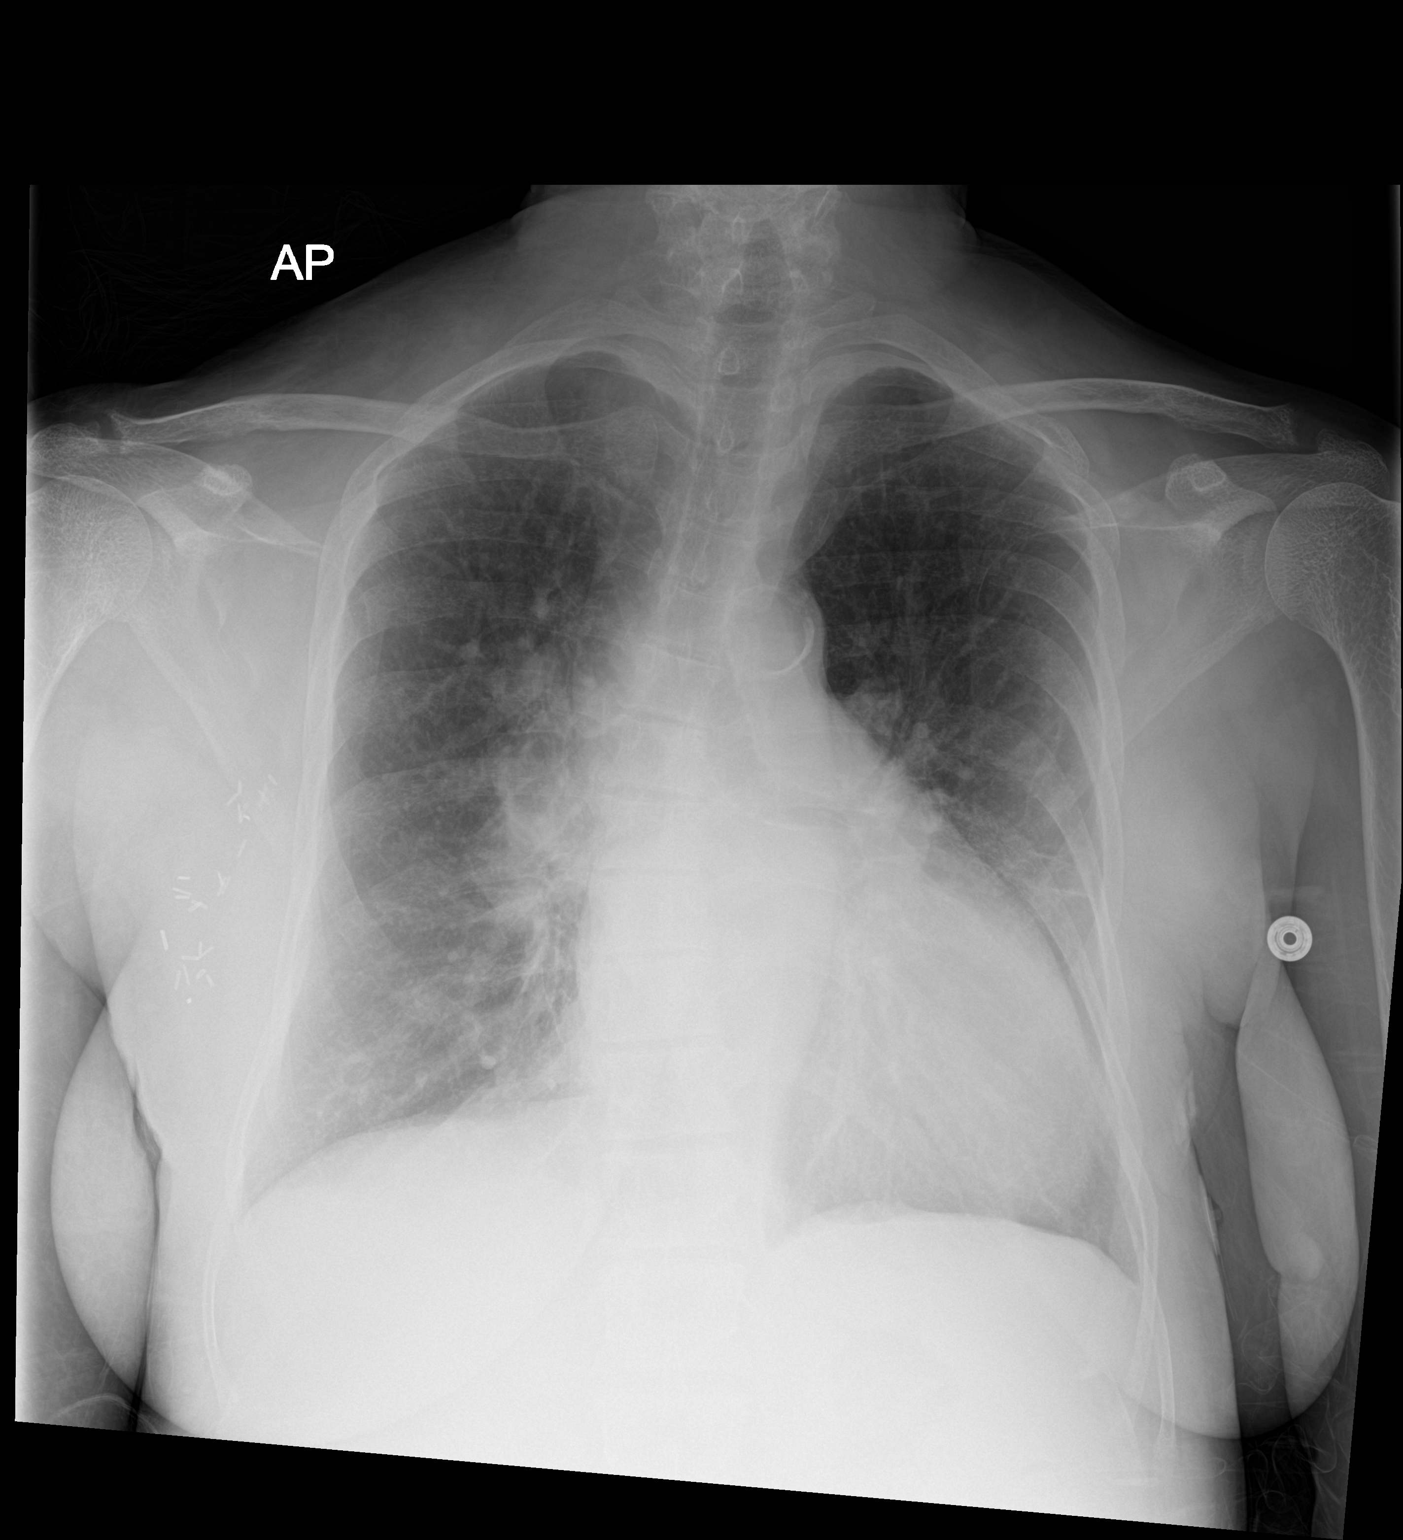

[2 of 2 positions shown; findings below may reference images not displayed]

FINDINGS: Mild-to-moderate enlargement of the cardiopericardial silhouette.
Prominent central pulmonary vasculature as shown previously.
Atherosclerotic aortic arch.

Cephalization of blood flow. Subsegmental atelectasis or scarring in
the lingula. No airspace edema observed. No pleural effusion.

Prior right axillary dissection.  Dextroconvex thoracic scoliosis.
IMPRESSION: 1. Cardiomegaly with both pulmonary venous and pulmonary arterial
hypertension. No overt pulmonary edema.

## 2014-03-25 MED ORDER — HYDROMORPHONE HCL 1 MG/ML IJ SOLN: 1.0000 mg | INTRAMUSCULAR | Status: AC | PRN

## 2014-03-25 MED ORDER — METOPROLOL TARTRATE 50 MG PO TABS
50.0000 mg | ORAL_TABLET | Freq: Two times a day (BID) | ORAL | Status: DC
  Administered 2014-03-26 – 2014-03-29 (×7): 50 mg via ORAL
  Filled 2014-03-25 (×9): qty 1

## 2014-03-25 MED ORDER — PIPERACILLIN-TAZOBACTAM IN DEX 2-0.25 GM/50ML IV SOLN
2.2500 g | Freq: Three times a day (TID) | INTRAVENOUS | Status: DC
  Filled 2014-03-25 (×2): qty 50

## 2014-03-25 MED ORDER — FENTANYL 50 MCG/HR TD PT72
50.0000 ug | MEDICATED_PATCH | TRANSDERMAL | Status: DC
  Administered 2014-03-27: 50 ug via TRANSDERMAL
  Filled 2014-03-25: qty 1

## 2014-03-25 MED ORDER — LAMIVUDINE 10 MG/ML PO SOLN
50.0000 mg | Freq: Every day | ORAL | Status: DC
  Administered 2014-03-28: 50 mg via ORAL
  Filled 2014-03-25 (×3): qty 5

## 2014-03-25 MED ORDER — SODIUM CHLORIDE 0.9 % IJ SOLN
3.0000 mL | Freq: Two times a day (BID) | INTRAMUSCULAR | Status: DC
Start: 2014-03-25 — End: 2014-03-29
  Administered 2014-03-26 – 2014-03-29 (×7): 3 mL via INTRAVENOUS

## 2014-03-25 MED ORDER — SODIUM CHLORIDE 0.9 % IJ SOLN
3.0000 mL | INTRAMUSCULAR | Status: DC | PRN
  Administered 2014-03-29: 3 mL via INTRAVENOUS
  Filled 2014-03-25: qty 3

## 2014-03-25 MED ORDER — DULOXETINE HCL 60 MG PO CPEP
90.0000 mg | ORAL_CAPSULE | Freq: Every day | ORAL | Status: DC
  Administered 2014-03-27 – 2014-03-29 (×3): 90 mg via ORAL
  Filled 2014-03-25 (×4): qty 1

## 2014-03-25 MED ORDER — ONDANSETRON HCL 4 MG/2ML IJ SOLN
4.0000 mg | Freq: Four times a day (QID) | INTRAMUSCULAR | Status: DC | PRN
  Administered 2014-03-29: 4 mg via INTRAVENOUS
  Filled 2014-03-25: qty 2

## 2014-03-25 MED ORDER — ZIDOVUDINE 100 MG PO CAPS
100.0000 mg | ORAL_CAPSULE | Freq: Three times a day (TID) | ORAL | Status: DC
  Administered 2014-03-26 – 2014-03-29 (×10): 100 mg via ORAL
  Filled 2014-03-25 (×13): qty 1

## 2014-03-25 MED ORDER — HEPARIN SODIUM (PORCINE) 5000 UNIT/ML IJ SOLN
5000.0000 [IU] | Freq: Three times a day (TID) | INTRAMUSCULAR | Status: DC
  Administered 2014-03-26 – 2014-03-29 (×9): 5000 [IU] via SUBCUTANEOUS
  Filled 2014-03-25 (×12): qty 1

## 2014-03-25 MED ORDER — BUPROPION HCL ER (XL) 150 MG PO TB24
150.0000 mg | ORAL_TABLET | Freq: Every day | ORAL | Status: DC
  Administered 2014-03-27 – 2014-03-29 (×3): 150 mg via ORAL
  Filled 2014-03-25 (×4): qty 1

## 2014-03-25 MED ORDER — DEXTROSE 5 % IV SOLN
2.0000 g | INTRAVENOUS | Status: DC
  Administered 2014-03-26: 2 g via INTRAVENOUS
  Filled 2014-03-25 (×4): qty 2

## 2014-03-25 MED ORDER — DILTIAZEM HCL 60 MG PO TABS
60.0000 mg | ORAL_TABLET | Freq: Three times a day (TID) | ORAL | Status: DC
  Administered 2014-03-26 – 2014-03-29 (×10): 60 mg via ORAL
  Filled 2014-03-25 (×14): qty 1

## 2014-03-25 MED ORDER — ZOLPIDEM TARTRATE 5 MG PO TABS: 5.0000 mg | ORAL_TABLET | Freq: Every evening | ORAL | Status: DC | PRN

## 2014-03-25 MED ORDER — RENA-VITE PO TABS
1.0000 | ORAL_TABLET | Freq: Every day | ORAL | Status: DC
  Administered 2014-03-26 – 2014-03-28 (×4): 1 via ORAL
  Filled 2014-03-25 (×6): qty 1

## 2014-03-25 MED ORDER — SEVELAMER CARBONATE 800 MG PO TABS
800.0000 mg | ORAL_TABLET | Freq: Three times a day (TID) | ORAL | Status: DC
  Administered 2014-03-26 – 2014-03-27 (×3): 1600 mg via ORAL
  Administered 2014-03-28 – 2014-03-29 (×4): 800 mg via ORAL
  Filled 2014-03-25 (×12): qty 2

## 2014-03-25 MED ORDER — HYDROMORPHONE HCL 1 MG/ML IJ SOLN
1.0000 mg | Freq: Once | INTRAMUSCULAR | Status: AC
  Administered 2014-03-25: 1 mg via INTRAVENOUS
  Filled 2014-03-25: qty 1

## 2014-03-25 MED ORDER — CLINDAMYCIN PHOSPHATE 600 MG/50ML IV SOLN
600.0000 mg | Freq: Three times a day (TID) | INTRAVENOUS | Status: DC
  Administered 2014-03-26 – 2014-03-27 (×3): 600 mg via INTRAVENOUS
  Filled 2014-03-25 (×8): qty 50

## 2014-03-25 MED ORDER — PANTOPRAZOLE SODIUM 40 MG PO TBEC
40.0000 mg | DELAYED_RELEASE_TABLET | Freq: Every day | ORAL | Status: DC
Start: 2014-03-26 — End: 2014-03-29
  Administered 2014-03-27 – 2014-03-29 (×3): 40 mg via ORAL
  Filled 2014-03-25: qty 1

## 2014-03-25 MED ORDER — POLYVINYL ALCOHOL 1.4 % OP SOLN
1.0000 [drp] | Freq: Two times a day (BID) | OPHTHALMIC | Status: DC
  Administered 2014-03-26 – 2014-03-29 (×7): 1 [drp] via OPHTHALMIC
  Filled 2014-03-25: qty 15

## 2014-03-25 MED ORDER — VITAMIN C 500 MG PO TABS
500.0000 mg | ORAL_TABLET | Freq: Two times a day (BID) | ORAL | Status: DC
  Administered 2014-03-26 – 2014-03-29 (×6): 500 mg via ORAL
  Filled 2014-03-25 (×8): qty 1

## 2014-03-25 MED ORDER — RALTEGRAVIR POTASSIUM 400 MG PO TABS
400.0000 mg | ORAL_TABLET | Freq: Two times a day (BID) | ORAL | Status: DC
  Administered 2014-03-26 – 2014-03-29 (×7): 400 mg via ORAL
  Filled 2014-03-25 (×9): qty 1

## 2014-03-25 MED ORDER — SILDENAFIL CITRATE 20 MG PO TABS
20.0000 mg | ORAL_TABLET | Freq: Three times a day (TID) | ORAL | Status: DC
  Administered 2014-03-26 – 2014-03-29 (×10): 20 mg via ORAL
  Filled 2014-03-25 (×13): qty 1

## 2014-03-25 MED ORDER — VANCOMYCIN HCL IN DEXTROSE 1-5 GM/200ML-% IV SOLN
1000.0000 mg | INTRAVENOUS | Status: DC
  Filled 2014-03-25: qty 200

## 2014-03-25 MED ORDER — VANCOMYCIN HCL 500 MG IV SOLR
500.0000 mg | INTRAVENOUS | Status: DC
  Filled 2014-03-25: qty 500

## 2014-03-25 MED ORDER — SODIUM CHLORIDE 0.9 % IJ SOLN
3.0000 mL | Freq: Two times a day (BID) | INTRAMUSCULAR | Status: DC
  Administered 2014-03-26 – 2014-03-27 (×2): 3 mL via INTRAVENOUS

## 2014-03-25 MED ORDER — ONDANSETRON HCL 4 MG PO TABS: 4.0000 mg | ORAL_TABLET | Freq: Four times a day (QID) | ORAL | Status: DC | PRN

## 2014-03-25 MED ORDER — VANCOMYCIN HCL 10 G IV SOLR
1250.0000 mg | Freq: Once | INTRAVENOUS | Status: AC
  Administered 2014-03-26: 1250 mg via INTRAVENOUS
  Filled 2014-03-25: qty 1250

## 2014-03-25 MED ORDER — HYPROMELLOSE (GONIOSCOPIC) 2.5 % OP SOLN
1.0000 [drp] | Freq: Two times a day (BID) | OPHTHALMIC | Status: DC
Start: 2014-03-25 — End: 2014-03-25

## 2014-03-25 MED ORDER — LAMIVUDINE 100 MG PO TABS: 50.0000 mg | ORAL_TABLET | Freq: Every day | ORAL | Status: DC

## 2014-03-25 MED ORDER — SODIUM CHLORIDE 0.9 % IV SOLN: 250.0000 mL | INTRAVENOUS | Status: DC | PRN

## 2014-03-25 MED ORDER — OXYCODONE HCL 5 MG PO TABS
5.0000 mg | ORAL_TABLET | Freq: Four times a day (QID) | ORAL | Status: DC | PRN
  Administered 2014-03-26 – 2014-03-29 (×4): 5 mg via ORAL
  Filled 2014-03-25 (×3): qty 1

## 2014-03-25 MED ORDER — LORAZEPAM 0.5 MG PO TABS
0.2500 mg | ORAL_TABLET | Freq: Four times a day (QID) | ORAL | Status: DC | PRN
Start: 2014-03-25 — End: 2014-03-29

## 2014-03-25 NOTE — ED Notes (Signed)
Admitting MD, Dr. Lenox Ahr at the bedside

## 2014-03-25 NOTE — H&P (Signed)
[facility] Admission History and Physical Service Pager: [phone]  Patient name: [name] Medical record number: [id number] Date of birth: [birth year] Age: 64 y.o. Gender: female  Primary Care Provider: Harriett Sine, MD Consultants: Nephrology, ID, Vascular Code Status: Full (per discussion with patient on admission)  Chief Complaint: redness and pain in right arm  Assessment and Plan: [name] is a 64 y.o. female presenting with RUE cellulitis vs AVF infection. PMH is significant for AIDS, ESRD on HD MWF, HTN, pulmonary HTN, depression.  RUE cellulitis: Was seen in ED on 1/22 for same problem. Subcutaneous edema of entire arm, no DVT on UE duplex. Was treated for cellulitis with Vanc and PO Cipro after discussion with nephrology and d/c'd back to SNF. Concern for possible AV fistula infection. Does not meet SIRS/sepsis criteria, but WBC 2.8 (ANC 1.6). Lactic acid normal at 1.8.  Site erythematous and edematous with slight purulent drainage from open wound. - Admit to tele, under FPTS, attending Mingo Amber - IV Vanc/Ceftazidine per pharmacy - per ID recommendation.  Consult to ID, appreciate recommendations  - Monitor on tele - f/u BCx (drawn after initiation of antibiotics from 1/21)  - f/u fungal cultures - neurovasc checks q4h O/N - Consider Ortho consult if concern for compartment syndrome - neurovasc intact currently - PT/OT/SW for re-placement in SNF - Pain control with home fentanyl patch and Oxy IR - Consult to VVS for possibly infected AVF of R arm.  Appreciate recommendations and coordination with nephrology and ID.   AIDS: Last CD4 count 300 (12/22), previously 17. - Consult ID, appreciate recs - Continue home lamivudine, raltegravir, zidovudine  ESRD on HD: Recently missed HD on Friday. Cr 15.01, electrolytes stable on admission. - Consult nephrology, appreciate recs - Will consult vascular surgery for possible need for new  access - appreciate recs - Monitor electrolytes - f/u AM BMET, Mag, Phos  Pancytopenia: WBC 2.8, Hgb 8.9, Platelets 91 on admission. Likely related to AIDS vs iatrogenic from medications. - Continue to monitor - Repeat CBC in AM  Pulmonary HTN: Last Echo (12/2008) with EF 60-65%, PA peak pressure 72. - Continue home sildenafil - Could consider repeat Echo if respiratory status changes  HTN: BP elevated to [id number] systolic on admission - Continue home metoprolol, diltiazem - Monitor closely in setting of infection  Depression/Anxiety: Mood stable currently - Continue home Wellbutrin, Cymbalta, Ativan  GERD: No symptoms currently - Continue home PPI (replace with protonix)  Chronic pain: - Continue home fentanyl patch, Oxy IR  FEN/GI: Renal diet, SLIV Prophylaxis: SQ heparin  Disposition: Admit to tele, FPTS, attending [facility]. Dispo back to SNF (SW/PT/OT consulted) when clinically improved  History of Present Illness: [name] is a 64 y.o. female presenting with RUE cellulitis with AVF in this arm and known AIDS.  Pt states she was in her normal state of health up until about Wednesday (Dialysis M/W/F).  States she had dialysis draw on the R arm which eventually caused her to start having worsening R wrist pain/erythema.  She presented to the ED two days ago for similar Sx and had RUE duplex performed which was negative for DVT and was d/c on Cipro PO and Vanc x 1 dose for MRSA/Pseudomonas coverage per renal recommendations at that time.  Denies any new fever, chills, or sweats.  She denies any numbness, weakness, or paresthesias in her R arm.  Previous Hx of LUE AVF that was eventually switched to R side due to recalcitrant  clotting in that arm.    In the ED, pt had BCx drawn (pending), CBC showing WBC of 2.8 and Hgb of 8.9 (baseline of 10-15?), platelets of 91, CMP showing creatinine of 15.01, albumin of 3.1.  CXR showing pulmonary HTN w/o acute pulmonary edema   Denies  alcohol, smoking, or drug abuse  Review Of Systems: Per HPI   Patient Active Problem List   Diagnosis Date Noted  . Arm swelling 10/10/2013  . Nausea with vomiting 12/15/2012  . CHF, chronic 11/25/2012  . Atrial fibrillation 05/02/2012  . Intracranial bleed 12/28/2011  . Angioedema of lips 09/21/2011  . Cellulitis of breast 03/09/2011  . Erosive esophagitis 12/15/2010  . Mallory - Weiss tear 12/15/2010  . UGI bleed 12/13/2010  . DIARRHEA 04/12/2009  . Human immunodeficiency virus (HIV) disease 11/15/2008  . ALCOHOL ABUSE 11/15/2008  . CHF 11/15/2008  . End stage renal disease 11/15/2008  . PANCREATITIS, HX OF 11/15/2008  . TOBACCO USE, QUIT 11/15/2008  . PAIN IN JOINT, UPPER ARM 08/07/2008  . ABDOMINAL WALL HERNIA 04/26/2007  . BRONCHITIS, ACUTE 11/16/2006  . HYPOKALEMIA, HX OF 07/13/2006  . ANEMIA, NORMOCYTIC, CHRONIC 03/23/2006  . ANXIETY 03/23/2006  . DEPRESSION 03/23/2006  . HYPERTENSION 03/23/2006  . GERD 03/23/2006  . PANCREATITIS 03/23/2006  . MASS, RIGHT AXILLA 03/23/2006  . HEADACHE 03/23/2006  . SYMPTOM, ENLARGEMENT, LYMPH NODES 03/23/2006  . SHINGLES, HX OF 03/23/2006  . HYSTERECTOMY, TOTAL, HX OF 03/23/2006   Past Medical History: Past Medical History  Diagnosis Date  . Pulmonary HTN   . Hypertension   . Depression   . Insomnia   . Chronic diarrhea   . HIV (human immunodeficiency virus infection)   . C. difficile colitis 10/10/11  . Muscle weakness (generalized)   . Intracranial hemorrhage   . Anxiety   . Anemia   . ESRD on hemodialysis    Past Surgical History: Past Surgical History  Procedure Laterality Date  . Dialysis shunts      previous one removed from left arm and now present one in right arm  . Esophagogastroduodenoscopy  12/15/2010    Procedure: ESOPHAGOGASTRODUODENOSCOPY (EGD);  Surgeon: Daneil Dolin, MD;  Location: [facility];  Service: Endoscopy;  Laterality: N/A;  . Tonsillectomy    . Biopsy thyroid    . Excision of breast  biopsy Right 05/04/2012    Procedure: EXCISION OF BREAST BIOPSY;  Surgeon: Donato Heinz, MD;  Location: [facility] ORS;  Service: General;  Laterality: Right;  Right Excisional Breast Biopsy  . Shuntogram Right 10/19/2013    Procedure: FISTULOGRAM;  Surgeon: Conrad Waller, MD;  Location: [facility] CATH LAB;  Service: Cardiovascular;  Laterality: Right;   Social History: History  Substance Use Topics  . Smoking status: Former Research scientist (life sciences)  . Smokeless tobacco: Never Used  . Alcohol Use: No   Additional social history: denies tob/alcohol/drugs  Please also refer to relevant sections of EMR.  Family History: Family History  Problem Relation Age of Onset  . Anesthesia problems Neg Hx   . Hypotension Neg Hx   . Malignant hyperthermia Neg Hx   . Pseudochol deficiency Neg Hx    Allergies and Medications: Allergies  Allergen Reactions  . Penicillins     REACTION: rash  . Latex Rash   No current facility-administered medications on file prior to encounter.   Current Outpatient Prescriptions on File Prior to Encounter  Medication Sig Dispense Refill  . buPROPion (WELLBUTRIN XL) 150 MG 24 hr tablet Take 150 mg  by mouth daily.    . ciprofloxacin (CIPRO) 500 MG tablet Take 1 tablet (500 mg total) by mouth 2 (two) times daily. One po bid x 7 days 14 tablet 0  . diltiazem (CARDIZEM) 60 MG tablet Take 60 mg by mouth every 8 (eight) hours.    . DULoxetine (CYMBALTA) 30 MG capsule Take 30 mg by mouth daily. Take with 60mg  capsule for total of 90 mg.    . DULoxetine (CYMBALTA) 60 MG capsule Take 60 mg by mouth daily. Take with 30mg  capsule for a total of 90mg     . epoetin alfa (EPOGEN,PROCRIT) 60454 UNIT/ML injection Inject 8,000 Units into the skin 3 (three) times a week. To be given at dialysis, Monday Wednesday and Friday    . fentaNYL (DURAGESIC - DOSED MCG/HR) 50 MCG/HR Apply one patch every 72 hours for pain. Remove old patch. External use only. Rotate sites. 10 patch 0  . hydroxypropyl methylcellulose (ISOPTO  TEARS) 2.5 % ophthalmic solution Place 1 drop into both eyes 2 (two) times daily.      Marland Kitchen ipratropium-albuterol (DUONEB) 0.5-2.5 (3) MG/3ML SOLN Take 3 mLs by nebulization every 6 (six) hours as needed. For shortness of breath/ wheezing    . lamivudine (EPIVIR) 100 MG tablet Take 0.5 tablets (50 mg total) by mouth daily. Take one half tablet daily 150 tablet 90  . lidocaine-prilocaine (EMLA) cream Apply 1 application topically 3 (three) times a week. Apply 1 hour prior to dialysis access site on Monday Wednesday and Friday    . LORazepam (ATIVAN) 0.5 MG tablet Take 0.25 mg by mouth every 6 (six) hours as needed for anxiety.    . metoprolol (LOPRESSOR) 50 MG tablet Take 50 mg by mouth 2 (two) times daily.    . multivitamin (RENA-VIT) TABS tablet Take 1 tablet by mouth at bedtime.    Marland Kitchen omeprazole (PRILOSEC) 20 MG capsule Take 40 mg by mouth daily.    . ondansetron (ZOFRAN) 4 MG tablet Take 2 mg by mouth 3 (three) times daily. nausea    . oxyCODONE (OXY IR/ROXICODONE) 5 MG immediate release tablet Take 5 mg by mouth every 6 (six) hours as needed for moderate pain.     Marland Kitchen oxycodone (OXY-IR) 5 MG capsule Take 5 mg by mouth at bedtime.    . raltegravir (ISENTRESS) 400 MG tablet Take 400 mg by mouth 2 (two) times daily.     . sevelamer carbonate (RENVELA) 800 MG tablet Take 800-1,600 mg by mouth 3 (three) times daily with meals.     . sildenafil (REVATIO) 20 MG tablet Take 20 mg by mouth 3 (three) times daily.     . vitamin C (ASCORBIC ACID) 500 MG tablet Take 500 mg by mouth 2 (two) times daily.    . zaleplon (SONATA) 5 MG capsule Take 5 mg by mouth at bedtime.    . zidovudine (RETROVIR) 100 MG capsule Take 100 mg by mouth 3 (three) times daily.        Objective: BP 173/76 mmHg  Pulse 68  Temp(Src) 98.7 F (37.1 C) (Oral)  Resp 15  SpO2 96% Exam: General: chronically ill appearing AAF laying in bed, NAD HEENT: NCAT, poor dentition, dry MM, yellow tinge on tongue Cardiovascular: RRR, no m/r/g. 2+  DP/radial pulses b/l Respiratory: CTAB, no w/r/c. Normal WOB Abdomen: Soft, NDNT, +BS, no HSM or masses, no rebound/guarding Extremities/Skin: No LE edema. 2cm open wound on R forearm with small amount of purulent drainage. Erythema and edema over entire R arm.  Bruit palpated from AV fistula.  Radial pulse +2 R wrist and C5-T1 intact sensation and muscular  Neuro: CN 2-12 grossly intact. Strength intact, but painful to move R hand. Sensation intact.  Labs and Imaging: CBC BMET   Recent Labs Lab 03/25/14 1542 03/25/14 1555  WBC 2.8*  --   HGB 8.9* 10.5*  HCT 28.4* 31.0*  PLT 91*  --     Recent Labs Lab 03/25/14 1542 03/25/14 1555  NA 141 141  K 4.3 4.2  CL 98 99  CO2 26  --   BUN 57* 51*  CREATININE 15.01* 13.80*  GLUCOSE 99 98  CALCIUM 8.6  --       Lactic acid - 1.8  US Duplex RUE 03/23/14-  Other Findings: Subcutaneous edema is present involving nearly the entire visualized forearm. No focal fluid collections are identified.  IMPRESSION: No evidence of right upper extremity deep venous thrombosis. Open radiocephalic fistula.  EKG 03/25/14 - LAFB, NSR, QTc prolongation   CXR 03/25/14 - Pulmonary HTN, no overt pulmonary edema   Lavon Paganini, MD 03/25/2014, 4:57 PM PGY-1, [facility] Intern pager: [phone], text pages welcome   I have seen and evaluated the pt with Dr. Brita Romp.  Please refer to her excellent H&P above for further information.  My additions are in red.  Tamela Oddi Micheala Morissette PGY 3 [facility]  03/25/2014, 7:44 PM

## 2014-03-25 NOTE — ED Notes (Signed)
Attempted to call report - secretary advised Rodena Piety, RN, will return call.

## 2014-03-25 NOTE — ED Notes (Signed)
PA at bedside.

## 2014-03-25 NOTE — ED Notes (Signed)
Pt returned from X-ray.  

## 2014-03-25 NOTE — ED Notes (Signed)
MD at bedside. 

## 2014-03-25 NOTE — Progress Notes (Addendum)
ANTIBIOTIC CONSULT NOTE - INITIAL  Pharmacy Consult for vancomycin and zosyn Indication: possible HD graft infection  Allergies  Allergen Reactions  . Penicillins     REACTION: rash  . Latex Rash    Patient Measurements:    Vital Signs: Temp: 98.7 F (37.1 C) (01/24 1519) Temp Source: Oral (01/24 1519) BP: 173/76 mmHg (01/24 1630) Pulse Rate: 68 (01/24 1630) Intake/Output from previous day:   Intake/Output from this shift:    Labs:  Recent Labs  03/23/14 1155 03/25/14 1542 03/25/14 1555  WBC 2.9* 2.8*  --   HGB 8.7* 8.9* 10.5*  PLT 112* PENDING  --   CREATININE 10.74* 15.01* 13.80*   Estimated Creatinine Clearance: 3.4 mL/min (by C-G formula based on Cr of 13.8). No results for input(s): VANCOTROUGH, VANCOPEAK, VANCORANDOM, GENTTROUGH, GENTPEAK, GENTRANDOM, TOBRATROUGH, TOBRAPEAK, TOBRARND, AMIKACINPEAK, AMIKACINTROU, AMIKACIN in the last 72 hours.   Microbiology: No results found for this or any previous visit (from the past 720 hour(s)).  Medical History: Past Medical History  Diagnosis Date  . Pulmonary HTN   . Hypertension   . Depression   . Insomnia   . Chronic diarrhea   . HIV (human immunodeficiency virus infection)   . C. difficile colitis 10/10/11  . Muscle weakness (generalized)   . Intracranial hemorrhage   . Anxiety   . Anemia   . ESRD on hemodialysis    Assessment: 64 year old female with past medical history of pulmonary hypertension, hypertension, HIV, and end-stage renal disease on hemodialysis Monday Wednesday and Friday who presents emergency room for evaluation of right arm pain. Noted 6 days of welling, red, and warm to touch. Will start broad abx for possible infected HD graft infection. No fevers noted, wbc low at 2.8.   HD schedule is MWF.  Goal of Therapy:  Pre-HD vancomycin trough 15-25  Plan:  Measure antibiotic drug levels at steady state Follow up culture results Vancomycin 1250mg  IV x1 now the 500mg  qHD session  Zosyn  2.25g IV q8 hours  Erin Hearing PharmD., BCPS Clinical Pharmacist Pager [phone] 03/25/2014 4:51 PM  Received call from [facility] to change zosyn to [place]. Will give 2g now then repeat dosing after HD sessions.  03/25/2014 6:08 PM

## 2014-03-25 NOTE — Consult Note (Signed)
Established Dialysis Access  History of Present Illness  [name] is a 64 y.o. ([birth year]) female who presents for possible infected R arm fistula.  The patient is right hand dominant.  Previous access procedures have been completed in both arms.  The patient's complication from previous access procedures include: thrombosis.  This patient previous underwent a venoplasty of her right subclavian vein and innominate vein (10/19/13).  The patient has never had a previous PPM placed.  Pt has been on abx for reported cellulitis in R arm.  Past Medical History  Diagnosis Date  . Pulmonary HTN   . Hypertension   . Depression   . Insomnia   . Chronic diarrhea   . HIV (human immunodeficiency virus infection)   . C. difficile colitis 10/10/11  . Muscle weakness (generalized)   . Intracranial hemorrhage   . Anxiety   . Anemia   . ESRD on hemodialysis     Past Surgical History  Procedure Laterality Date  . Dialysis shunts      previous one removed from left arm and now present one in right arm  . Esophagogastroduodenoscopy  12/15/2010    Procedure: ESOPHAGOGASTRODUODENOSCOPY (EGD);  Surgeon: Daneil Dolin, MD;  Location: [facility];  Service: Endoscopy;  Laterality: N/A;  . Tonsillectomy    . Biopsy thyroid    . Excision of breast biopsy Right 05/04/2012    Procedure: EXCISION OF BREAST BIOPSY;  Surgeon: Donato Heinz, MD;  Location: [facility] ORS;  Service: General;  Laterality: Right;  Right Excisional Breast Biopsy  . Shuntogram Right 10/19/2013    Procedure: FISTULOGRAM;  Surgeon: Conrad Aneth, MD;  Location: [facility] CATH LAB;  Service: Cardiovascular;  Laterality: Right;    History   Social History  . Marital Status: Widowed    Spouse Name: N/A    Number of Children: N/A  . Years of Education: N/A   Occupational History  . Not on file.   Social History Main Topics  . Smoking status: Former Research scientist (life sciences)  . Smokeless tobacco: Never Used  . Alcohol Use: No  . Drug Use: No  .  Sexual Activity: Not Currently   Other Topics Concern  . Not on file   Social History Narrative    Family History  Problem Relation Age of Onset  . Anesthesia problems Neg Hx   . Hypotension Neg Hx   . Malignant hyperthermia Neg Hx   . Pseudochol deficiency Neg Hx     No current facility-administered medications on file prior to encounter.   Current Outpatient Prescriptions on File Prior to Encounter  Medication Sig Dispense Refill  . buPROPion (WELLBUTRIN XL) 150 MG 24 hr tablet Take 150 mg by mouth daily.    . ciprofloxacin (CIPRO) 500 MG tablet Take 1 tablet (500 mg total) by mouth 2 (two) times daily. One po bid x 7 days 14 tablet 0  . diltiazem (CARDIZEM) 60 MG tablet Take 60 mg by mouth every 8 (eight) hours.    . DULoxetine (CYMBALTA) 30 MG capsule Take 30 mg by mouth daily. Take with 60mg  capsule for total of 90 mg.    . DULoxetine (CYMBALTA) 60 MG capsule Take 60 mg by mouth daily. Take with 30mg  capsule for a total of 90mg     . epoetin alfa (EPOGEN,PROCRIT) 09811 UNIT/ML injection Inject 8,000 Units into the skin 3 (three) times a week. To be given at dialysis, Monday Wednesday and Friday    . fentaNYL (DURAGESIC - DOSED  MCG/HR) 50 MCG/HR Apply one patch every 72 hours for pain. Remove old patch. External use only. Rotate sites. 10 patch 0  . hydroxypropyl methylcellulose (ISOPTO TEARS) 2.5 % ophthalmic solution Place 1 drop into both eyes 2 (two) times daily.      Marland Kitchen ipratropium-albuterol (DUONEB) 0.5-2.5 (3) MG/3ML SOLN Take 3 mLs by nebulization every 6 (six) hours as needed. For shortness of breath/ wheezing    . lamivudine (EPIVIR) 100 MG tablet Take 0.5 tablets (50 mg total) by mouth daily. Take one half tablet daily 150 tablet 90  . lidocaine-prilocaine (EMLA) cream Apply 1 application topically 3 (three) times a week. Apply 1 hour prior to dialysis access site on Monday Wednesday and Friday    . LORazepam (ATIVAN) 0.5 MG tablet Take 0.25 mg by mouth every 6 (six) hours  as needed for anxiety.    . metoprolol (LOPRESSOR) 50 MG tablet Take 50 mg by mouth 2 (two) times daily.    . multivitamin (RENA-VIT) TABS tablet Take 1 tablet by mouth at bedtime.    Marland Kitchen omeprazole (PRILOSEC) 20 MG capsule Take 40 mg by mouth daily.    . ondansetron (ZOFRAN) 4 MG tablet Take 2 mg by mouth 3 (three) times daily. nausea    . oxyCODONE (OXY IR/ROXICODONE) 5 MG immediate release tablet Take 5 mg by mouth every 6 (six) hours as needed for moderate pain.     Marland Kitchen oxycodone (OXY-IR) 5 MG capsule Take 5 mg by mouth at bedtime.    . raltegravir (ISENTRESS) 400 MG tablet Take 400 mg by mouth 2 (two) times daily.     . sevelamer carbonate (RENVELA) 800 MG tablet Take 800-1,600 mg by mouth 3 (three) times daily with meals.     . sildenafil (REVATIO) 20 MG tablet Take 20 mg by mouth 3 (three) times daily.     . vitamin C (ASCORBIC ACID) 500 MG tablet Take 500 mg by mouth 2 (two) times daily.    . zaleplon (SONATA) 5 MG capsule Take 5 mg by mouth at bedtime.    . zidovudine (RETROVIR) 100 MG capsule Take 100 mg by mouth 3 (three) times daily.        Allergies  Allergen Reactions  . Penicillins     REACTION: rash  . Latex Rash    REVIEW OF SYSTEMS:  (Positives checked otherwise negative)  CARDIOVASCULAR:  []  chest pain, []  chest pressure, []  palpitations, []  shortness of breath when laying flat, []  shortness of breath with exertion,  []  pain in feet when walking, []  pain in feet when laying flat, []  history of blood clot in veins (DVT), []  history of phlebitis, [x]  swelling in R arm, []  varicose veins  PULMONARY:  []  productive cough, []  asthma, []  wheezing  NEUROLOGIC:  []  weakness in arms or legs, []  numbness in arms or legs, []  difficulty speaking or slurred speech, []  temporary loss of vision in one eye, []  dizziness  HEMATOLOGIC:  []  bleeding problems, []  problems with blood clotting too easily  MUSCULOSKEL:  []  joint pain, []  joint swelling, [x]  abx for cellulitis  GASTROINTEST:   []  vomiting blood, []  blood in stool     GENITOURINARY:  []  burning with urination, []  blood in urine  PSYCHIATRIC:  []  history of major depression  INTEGUMENTARY:  []  rashes, []  ulcers    Physical Examination  Filed Vitals:   03/25/14 1545 03/25/14 1630 03/25/14 1738 03/25/14 1844  BP: 175/73 173/76 173/84 155/75  Pulse:  68 74 71  Temp:   98.4 F (36.9 C) 98.3 F (36.8 C)  TempSrc:   Oral Oral  Resp: 15  16 16   SpO2:  96% 100% 100%   There is no weight on file to calculate BMI.  General: A&O x 3, WD, obese, facies symmetric  Pulmonary: Sym exp, good air movt, CTAB, no rales, rhonchi, & wheezing  Cardiac: RRR, Nl S1, S2, no Murmurs, rubs or gallops  Vascular: Vessel Right Left  Radial Palpable Palpable  Ulnar Not Palpable Not Palpable  Brachial Palpable Palpable   Gastrointestinal: soft, NTND, -G/R, - HSM, - masses, - CVAT B  Musculoskeletal: M/S 5/5 throughout , Extremities without  ischemic changes , + palpable thrill in access in R forearm, + bruit in access, eschar overlying fistula, + edema throughout R arm, no obvious erythematous streaking or TTP overlying fistula  Neurologic: Pain and light touch intact in extremities , Motor exam as listed above  Laboratory: CBC:    Component Value Date/Time   WBC 2.8* 03/25/2014 1542   RBC 2.80* 03/25/2014 1542   HGB 10.5* 03/25/2014 1555   HCT 31.0* 03/25/2014 1555   PLT 91* 03/25/2014 1542   MCV 101.4* 03/25/2014 1542   MCH 31.8 03/25/2014 1542   MCHC 31.3 03/25/2014 1542   RDW 14.9 03/25/2014 1542   LYMPHSABS 0.8 03/25/2014 1542   MONOABS 0.3 03/25/2014 1542   EOSABS 0.1 03/25/2014 1542   BASOSABS 0.0 03/25/2014 1542    BMP:    Component Value Date/Time   NA 141 03/25/2014 1555   K 4.2 03/25/2014 1555   CL 99 03/25/2014 1555   CO2 26 03/25/2014 1542   GLUCOSE 98 03/25/2014 1555   BUN 51* 03/25/2014 1555   CREATININE 13.80* 03/25/2014 1555   CALCIUM 8.6 03/25/2014 1542   CALCIUM 9.0 03/12/2011 0527    GFRNONAA 2* 03/25/2014 1542   GFRAA 3* 03/25/2014 1542    Coagulation: Lab Results  Component Value Date   INR 1.14 12/28/2011   INR 1.10 12/13/2010   INR 1.10 01/26/2010   No results found for: PTT   Medical Decision Making  MILANIA HAMRIC is a 64 y.o. 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  These lesion require serial dilation q3-6 months to maintain lumen.  This is the likely cause of her R arm swelling  I don't see any signs of infection in the fistula.  In general, AVF are resistant infection.  The patient has erosions overlying the fistula that can result in torrential bleeding.  I recommend:  1. R arm fistulogram, possible R SCV/innominate venoplasty 2. If the R innominate vein is occluded, I would ligate the R RC AVF as the arm swelling will continue indefinitely if not worsen. 3. If R SCV/Innominate stenosis can be dilated, then I would try to salvage the R RC AVF by resecting the ulcerated skin if not directly involving the fistula.  It is possible that this will not be possible and the fistula will need to ligate. 4. If the fistula needs to ligated, I proceed with a TDC placement.  If the L IJV is occluded, unfortunately a femoral tunneled dialysis catheter will need to be placed.  Will try to post this for the hybrid OR tomorrow, but might not be able to get OR space, so might  be Tuesday at the earliest.  Adele Barthel, MD [facility] Office: [phone] Pager: [phone]  03/25/2014, 7:17 PM

## 2014-03-25 NOTE — ED Provider Notes (Signed)
CSN: [id number]     Arrival date & time 03/25/14  1501 History   First MD Initiated Contact with Patient 03/25/14 1506     Chief Complaint  Patient presents with  . Arm Injury   HPI  Patient is a 64 year old female with past medical history of pulmonary hypertension, hypertension, HIV, and end-stage renal disease on hemodialysis Monday Wednesday and Friday who presents emergency room for evaluation of right arm pain. Patient comes from a nursing home. She states that for the past 6 days she has had swelling in her right arm with a pins and needles sharpness that is constant. She also noticed that her arm has been red and warm to the touch. Patient was seen in the emergency room on 03/23/2014 for evaluation of her arm then. She had a negative DVT study at that time. It was suspected that this medically possible cellulitis and patient was given Cipro and affect myself. Patient is unsure whether she is taking Cipro at this time. Patient has not had dialysis since Wednesday of this week. She states she missed her Friday dialysis due to snow. She denies any chest pain, shortness of breath, leg swelling, abdominal distention, abdominal pain, diarrhea, constipation, melena, hematochezia. She does admit to some chills and nausea with vomiting yesterday and today.  Past Medical History  Diagnosis Date  . Pulmonary HTN   . Hypertension   . Depression   . Insomnia   . Chronic diarrhea   . HIV (human immunodeficiency virus infection)   . C. difficile colitis 10/10/11  . Muscle weakness (generalized)   . Intracranial hemorrhage   . Anxiety   . Anemia   . ESRD on hemodialysis    Past Surgical History  Procedure Laterality Date  . Dialysis shunts      previous one removed from left arm and now present one in right arm  . Esophagogastroduodenoscopy  12/15/2010    Procedure: ESOPHAGOGASTRODUODENOSCOPY (EGD);  Surgeon: Daneil Dolin, MD;  Location: [facility];  Service: Endoscopy;  Laterality: N/A;   . Tonsillectomy    . Biopsy thyroid    . Excision of breast biopsy Right 05/04/2012    Procedure: EXCISION OF BREAST BIOPSY;  Surgeon: Donato Heinz, MD;  Location: [facility];  Service: General;  Laterality: Right;  Right Excisional Breast Biopsy  . Shuntogram Right 10/19/2013    Procedure: FISTULOGRAM;  Surgeon: Conrad Headrick, MD;  Location: [facility] CATH LAB;  Service: Cardiovascular;  Laterality: Right;   Family History  Problem Relation Age of Onset  . Anesthesia problems Neg Hx   . Hypotension Neg Hx   . Malignant hyperthermia Neg Hx   . Pseudochol deficiency Neg Hx    History  Substance Use Topics  . Smoking status: Former Research scientist (life sciences)  . Smokeless tobacco: Never Used  . Alcohol Use: No   OB History    Gravida Para Term Preterm AB TAB SAB Ectopic Multiple Living   1 1 1       1      Review of Systems  Constitutional: Positive for chills. Negative for fever and fatigue.  Respiratory: Negative for cough, chest tightness and shortness of breath.   Cardiovascular: Negative for chest pain, palpitations and leg swelling.  Gastrointestinal: Positive for nausea and vomiting. Negative for abdominal pain, diarrhea, constipation and blood in stool.  Genitourinary: Negative for dysuria, urgency, hematuria and difficulty urinating.  Musculoskeletal: Positive for arthralgias.  Skin: Positive for color change.  Neurological: Negative for dizziness and headaches.  All other systems reviewed and are negative.     Allergies  Penicillins and Latex  Home Medications   Prior to Admission medications   Medication Sig Start Date End Date Taking? Authorizing Provider  buPROPion (WELLBUTRIN XL) 150 MG 24 hr tablet Take 150 mg by mouth daily.   Yes Historical Provider, MD  ciprofloxacin (CIPRO) 500 MG tablet Take 1 tablet (500 mg total) by mouth 2 (two) times daily. One po bid x 7 days 03/23/14  Yes Maudry Diego, MD  diltiazem (CARDIZEM) 60 MG tablet Take 60 mg by mouth every 8 (eight) hours.   Yes  Historical Provider, MD  DULoxetine (CYMBALTA) 30 MG capsule Take 30 mg by mouth daily. Take with 60mg  capsule for total of 90 mg.   Yes Historical Provider, MD  DULoxetine (CYMBALTA) 60 MG capsule Take 60 mg by mouth daily. Take with 30mg  capsule for a total of 90mg    Yes Historical Provider, MD  epoetin alfa (EPOGEN,PROCRIT) 91478 UNIT/ML injection Inject 8,000 Units into the skin 3 (three) times a week. To be given at dialysis, Monday Wednesday and Friday   Yes Historical Provider, MD  fentaNYL (DURAGESIC - DOSED MCG/HR) 50 MCG/HR Apply one patch every 72 hours for pain. Remove old patch. External use only. Rotate sites. 02/20/14  Yes Estill Dooms, MD  hydroxypropyl methylcellulose (ISOPTO TEARS) 2.5 % ophthalmic solution Place 1 drop into both eyes 2 (two) times daily.     Yes Historical Provider, MD  ipratropium-albuterol (DUONEB) 0.5-2.5 (3) MG/3ML SOLN Take 3 mLs by nebulization every 6 (six) hours as needed. For shortness of breath/ wheezing   Yes Historical Provider, MD  lamivudine (EPIVIR) 100 MG tablet Take 0.5 tablets (50 mg total) by mouth daily. Take one half tablet daily 06/11/10  Yes Campbell Riches, MD  lidocaine-prilocaine (EMLA) cream Apply 1 application topically 3 (three) times a week. Apply 1 hour prior to dialysis access site on Monday Wednesday and Friday   Yes Historical Provider, MD  LORazepam (ATIVAN) 0.5 MG tablet Take 0.25 mg by mouth every 6 (six) hours as needed for anxiety. 09/08/13  Yes Tiffany L Reed, DO  metoprolol (LOPRESSOR) 50 MG tablet Take 50 mg by mouth 2 (two) times daily.   Yes Historical Provider, MD  multivitamin (RENA-VIT) TABS tablet Take 1 tablet by mouth at bedtime.   Yes Historical Provider, MD  omeprazole (PRILOSEC) 20 MG capsule Take 40 mg by mouth daily.   Yes Historical Provider, MD  ondansetron (ZOFRAN) 4 MG tablet Take 2 mg by mouth 3 (three) times daily. nausea   Yes Historical Provider, MD  oxyCODONE (OXY IR/ROXICODONE) 5 MG immediate release  tablet Take 5 mg by mouth every 6 (six) hours as needed for moderate pain.    Yes Historical Provider, MD  oxycodone (OXY-IR) 5 MG capsule Take 5 mg by mouth at bedtime.   Yes Historical Provider, MD  raltegravir (ISENTRESS) 400 MG tablet Take 400 mg by mouth 2 (two) times daily.    Yes Historical Provider, MD  sevelamer carbonate (RENVELA) 800 MG tablet Take 800-1,600 mg by mouth 3 (three) times daily with meals.    Yes Historical Provider, MD  sildenafil (REVATIO) 20 MG tablet Take 20 mg by mouth 3 (three) times daily.    Yes Historical Provider, MD  vitamin C (ASCORBIC ACID) 500 MG tablet Take 500 mg by mouth 2 (two) times daily.   Yes Historical Provider, MD  zaleplon (SONATA) 5 MG capsule Take 5 mg by  mouth at bedtime. 02/20/14  Yes Historical Provider, MD  zidovudine (RETROVIR) 100 MG capsule Take 100 mg by mouth 3 (three) times daily.     Yes Historical Provider, MD   BP 173/76 mmHg  Pulse 68  Temp(Src) 98.7 F (37.1 C) (Oral)  Resp 15  SpO2 96% Physical Exam  Constitutional: She is oriented to person, place, and time. She appears well-developed and well-nourished. No distress.  HENT:  Head: Normocephalic and atraumatic.  Mouth/Throat: Oropharynx is clear and moist. No oropharyngeal exudate.  Eyes: Conjunctivae are normal. Pupils are equal, round, and reactive to light. No scleral icterus. Right eye exhibits nystagmus. Left eye exhibits nystagmus.  Neck: Normal range of motion. Neck supple. No JVD present. No thyromegaly present.  Cardiovascular: Normal rate, regular rhythm, normal heart sounds and intact distal pulses.  Exam reveals no gallop and no friction rub.   No murmur heard. Pulses:      Radial pulses are 1+ on the right side, and 1+ on the left side.  Pulmonary/Chest: Effort normal and breath sounds normal. No respiratory distress. She has no wheezes. She has no rales. She exhibits no tenderness.  Abdominal: Soft. Bowel sounds are normal. She exhibits no distension and no  mass. There is no tenderness. There is no rebound and no guarding.  Musculoskeletal:  Generalized swelling of the right arm with redness and warmth to the humeral portion of the arm. There is a dialysis graft located in the forearm. There is 5 out of 5 strength with biceps flexion and extension and grip strength interosseous strength and, thumb opposition. There is tenderness palpation of generalized arm. There is full range of motion of the elbow and wrist. Patient has full sensation in the radial, medial, and ulnar nerve distributions.  Lymphadenopathy:    She has no cervical adenopathy.  Neurological: She is alert and oriented to person, place, and time. She has normal strength. No cranial nerve deficit or sensory deficit.  Skin: Skin is warm and dry. She is not diaphoretic.  Psychiatric: She has a normal mood and affect. Her behavior is normal. Judgment and thought content normal.  Nursing note and vitals reviewed.   ED Course  Procedures (including critical care time) Labs Review Labs Reviewed  CBC WITH DIFFERENTIAL/PLATELET - Abnormal; Notable for the following:    WBC 2.8 (*)    RBC 2.80 (*)    Hemoglobin 8.9 (*)    HCT 28.4 (*)    MCV 101.4 (*)    Platelets 91 (*)    Neutro Abs 1.6 (*)    All other components within normal limits  COMPREHENSIVE METABOLIC PANEL - Abnormal; Notable for the following:    BUN 57 (*)    Creatinine, Ser 15.01 (*)    Albumin 3.1 (*)    GFR calc non Af Amer 2 (*)    GFR calc Af Amer 3 (*)    Anion gap 17 (*)    All other components within normal limits  I-STAT CHEM 8, ED - Abnormal; Notable for the following:    BUN 51 (*)    Creatinine, Ser 13.80 (*)    Calcium, Ion 0.97 (*)    Hemoglobin 10.5 (*)    HCT 31.0 (*)    All other components within normal limits  CULTURE, BLOOD (ROUTINE X 2)  CULTURE, BLOOD (ROUTINE X 2)  I-STAT CG4 LACTIC ACID, ED  Randolm Idol, ED    Imaging Review Dg Chest 2 View  03/25/2014   CLINICAL DATA:  Shortness of breath. Hypertension and pulmonary hypertension.  EXAM: CHEST  2 VIEW  COMPARISON:  12/28/2011  FINDINGS: Mild-to-moderate enlargement of the cardiopericardial silhouette. Prominent central pulmonary vasculature as shown previously. Atherosclerotic aortic arch.  Cephalization of blood flow. Subsegmental atelectasis or scarring in the lingula. No airspace edema observed. No pleural effusion.  Prior right axillary dissection.  Dextroconvex thoracic scoliosis.  IMPRESSION: 1. Cardiomegaly with both pulmonary venous and pulmonary arterial hypertension. No overt pulmonary edema.   Electronically Signed   By: Sherryl Barters M.D.   On: 03/25/2014 16:35     EKG Interpretation   Date/Time:  Sunday March 25 2014 15:27:58 EST Ventricular Rate:  71 PR Interval:  167 QRS Duration: 88 QT Interval:  535 QTC Calculation: 581 R Axis:   10 Text Interpretation:  Sinus rhythm Anteroseptal infarct, age indeterminate  Prolonged QT interval Baseline wander in lead(s) V1 prolonged QT new   Confirmed by YAO  MD, DAVID ([id number]) on 03/25/2014 3:45:07 PM      MDM   Final diagnoses:  Shortness of breath  Arm swelling  End stage renal disease  HIV (human immunodeficiency virus infection)   Patient is a female who presents emergency room for evaluation of right arm pain. He also hot red arm with obvious generalized swelling throughout. Arm is neurovascularly intact. Patient has not had dialysis since Wednesday. She does have new prolonged QT. I-STAT troponin is negative. I-STAT lactic acid is negative. I-STAT Chem-8 reveals stable electrolytes. CBC reveals no change in white blood cell count is 03/23/2014. Blood cultures are currently pending. Chest x-ray no acute effusions or consolidations. Patient does have chronic stable pulmonary hypertension and cardiomegaly. DVT study of right arm was negative on 03/23/2014. Cannot rule out infected graft at this time. Vitals are stable. Will start Vancocin Zosyn  at this time. Have spoken with the family medicine resident for the unassigned service who admitted under the attending Dr. Mingo Amber on the [facility] floor. Patient seen by and discussed with Dr. Darl Householder who agrees with the above workup and plan.  Cherylann Parr, PA-C 03/25/14 Belle Yao, MD 03/26/14 1030

## 2014-03-25 NOTE — ED Notes (Signed)
Pt is from [facility] and was seen Friday for Rt arm Swelling and give PO antibiotic. Pt has taken two doses. PT states swelling was gotten worse and has spread to neck and chest. Pt arm is arm to touch with a 6cm skin abrasion  to the Rt arm. Pt receives dyalisis MWF and did not get treatment on Friday.

## 2014-03-25 NOTE — Consult Note (Signed)
Renal Service Consult Note [facility]  [name] 03/25/2014 Sol Blazing Requesting Physician:  Dr Mingo Amber  Reason for Consult:  ESRD patient with possible infected HD access RUE HPI: The patient is a 64 y.o. year-old with hx of HIV, ESRD presenting to ED with swelling, redness of R arm over her HD access. She was seen in ED 2 days ago with milder symptoms and was prescribed po abx. WBC 2.9 and temp in ED 98.3 with BP 155/75.     Seen by Dr Bridgett Larsson who believes the edema is from recurrent central venous stenosis and is planning for fistulogram Monday or Tuesday, he does not think AVF is infected.  May need aneurysm resection if AVF is salvagable.     Chart review: 7/04 - acute pancreatitis, HTN, depression, diffuse LAD, suspected lymphoma final path pending 4/05 - acute pancreatitis, HIV, HTN, bronchitis 5/06 - CKD prob d/t HIV or HTN, anemia 8/07 - MRSA sepsis, ESRD on HD, HIV, low plts, etoh abuse, HTN 11/07 - HTN crisi, ESRD, HIV 1/08 - Gram + bacteremia, ESRD on HD, HIV, HTN, UTI, anemia , low plts 3/08 - PNA, AIDS, ESRD, diarrhea 10/08 - PNA, ESRD, HTN, AIDS 10/09 - gastroenteritis, HTN, ESRD, HIV with full-blown AIDS 4/10 - LLL PNA, ESRD, pulm HTN, HIV 11/10 - diarrhea, rectal bleed, HIV, ESRD 4/11 - PNA, diarrhea, HIV, HTN, ESRD 11/11 - acute mastitis, ESRD, HIV, pancytopenia, pulm HTN 10/12 - UGIB, esophagitis, HIV, ESRD  1/13 - cellulitis of breast/ mastitis, r/o inflamm breast Ca, ESRD, HIV 7/13 - angioedema of lips/ facial swelling > Rx with empiric steroids, not on ACEi, ESRD. Possibly due to vol overload.  8/13 - Cdif colitis, HIV, ESRD, HTN 10/13 - fell at SNF, hit head and sustained intracranial bleed > admitted, followed by NSurg, was stable and dc'd back to SNF. HIV, ESRD w no heparin HD   ROS  no fever, chills, sob or CP, no abd pain, no n/v/d  Past Medical History  Past Medical History  Diagnosis Date  . Pulmonary HTN   .  Hypertension   . Depression   . Insomnia   . Chronic diarrhea   . HIV (human immunodeficiency virus infection)   . C. difficile colitis 10/10/11  . Muscle weakness (generalized)   . Intracranial hemorrhage   . Anxiety   . Anemia   . ESRD on hemodialysis    Past Surgical History  Past Surgical History  Procedure Laterality Date  . Dialysis shunts      previous one removed from left arm and now present one in right arm  . Esophagogastroduodenoscopy  12/15/2010    Procedure: ESOPHAGOGASTRODUODENOSCOPY (EGD);  Surgeon: Daneil Dolin, MD;  Location: [facility];  Service: Endoscopy;  Laterality: N/A;  . Tonsillectomy    . Biopsy thyroid    . Excision of breast biopsy Right 05/04/2012    Procedure: EXCISION OF BREAST BIOPSY;  Surgeon: Donato Heinz, MD;  Location: [facility];  Service: General;  Laterality: Right;  Right Excisional Breast Biopsy  . Shuntogram Right 10/19/2013    Procedure: FISTULOGRAM;  Surgeon: Conrad [place], MD;  Location: [facility] CATH LAB;  Service: Cardiovascular;  Laterality: Right;   Family History  Family History  Problem Relation Age of Onset  . Anesthesia problems Neg Hx   . Hypotension Neg Hx   . Malignant hyperthermia Neg Hx   . Pseudochol deficiency Neg Hx    Social History  reports that she has quit  smoking. She has never used smokeless tobacco. She reports that she does not drink alcohol or use illicit drugs. Allergies  Allergies  Allergen Reactions  . Penicillins     REACTION: rash  . Latex Rash   Home medications Prior to Admission medications   Medication Sig Start Date End Date Taking? Authorizing Provider  buPROPion (WELLBUTRIN XL) 150 MG 24 hr tablet Take 150 mg by mouth daily.   Yes Historical Provider, MD  ciprofloxacin (CIPRO) 500 MG tablet Take 1 tablet (500 mg total) by mouth 2 (two) times daily. One po bid x 7 days 03/23/14  Yes Maudry Diego, MD  diltiazem (CARDIZEM) 60 MG tablet Take 60 mg by mouth every 8 (eight) hours.   Yes Historical  Provider, MD  DULoxetine (CYMBALTA) 30 MG capsule Take 30 mg by mouth daily. Take with 60mg  capsule for total of 90 mg.   Yes Historical Provider, MD  DULoxetine (CYMBALTA) 60 MG capsule Take 60 mg by mouth daily. Take with 30mg  capsule for a total of 90mg    Yes Historical Provider, MD  epoetin alfa (EPOGEN,PROCRIT) 91478 UNIT/ML injection Inject 8,000 Units into the skin 3 (three) times a week. To be given at dialysis, Monday Wednesday and Friday   Yes Historical Provider, MD  fentaNYL (DURAGESIC - DOSED MCG/HR) 50 MCG/HR Apply one patch every 72 hours for pain. Remove old patch. External use only. Rotate sites. 02/20/14  Yes Estill Dooms, MD  hydroxypropyl methylcellulose (ISOPTO TEARS) 2.5 % ophthalmic solution Place 1 drop into both eyes 2 (two) times daily.     Yes Historical Provider, MD  ipratropium-albuterol (DUONEB) 0.5-2.5 (3) MG/3ML SOLN Take 3 mLs by nebulization every 6 (six) hours as needed. For shortness of breath/ wheezing   Yes Historical Provider, MD  lamivudine (EPIVIR) 100 MG tablet Take 0.5 tablets (50 mg total) by mouth daily. Take one half tablet daily 06/11/10  Yes Campbell Riches, MD  lidocaine-prilocaine (EMLA) cream Apply 1 application topically 3 (three) times a week. Apply 1 hour prior to dialysis access site on Monday Wednesday and Friday   Yes Historical Provider, MD  LORazepam (ATIVAN) 0.5 MG tablet Take 0.25 mg by mouth every 6 (six) hours as needed for anxiety. 09/08/13  Yes Tiffany L Reed, DO  metoprolol (LOPRESSOR) 50 MG tablet Take 50 mg by mouth 2 (two) times daily.   Yes Historical Provider, MD  multivitamin (RENA-VIT) TABS tablet Take 1 tablet by mouth at bedtime.   Yes Historical Provider, MD  omeprazole (PRILOSEC) 20 MG capsule Take 40 mg by mouth daily.   Yes Historical Provider, MD  ondansetron (ZOFRAN) 4 MG tablet Take 2 mg by mouth 3 (three) times daily. nausea   Yes Historical Provider, MD  oxyCODONE (OXY IR/ROXICODONE) 5 MG immediate release tablet Take  5 mg by mouth every 6 (six) hours as needed for moderate pain.    Yes Historical Provider, MD  oxycodone (OXY-IR) 5 MG capsule Take 5 mg by mouth at bedtime.   Yes Historical Provider, MD  raltegravir (ISENTRESS) 400 MG tablet Take 400 mg by mouth 2 (two) times daily.    Yes Historical Provider, MD  sevelamer carbonate (RENVELA) 800 MG tablet Take 800-1,600 mg by mouth 3 (three) times daily with meals.    Yes Historical Provider, MD  sildenafil (REVATIO) 20 MG tablet Take 20 mg by mouth 3 (three) times daily.    Yes Historical Provider, MD  vitamin C (ASCORBIC ACID) 500 MG tablet Take 500 mg by  mouth 2 (two) times daily.   Yes Historical Provider, MD  zaleplon (SONATA) 5 MG capsule Take 5 mg by mouth at bedtime. 02/20/14  Yes Historical Provider, MD  zidovudine (RETROVIR) 100 MG capsule Take 100 mg by mouth 3 (three) times daily.     Yes Historical Provider, MD   Liver Function Tests  Recent Labs Lab 03/25/14 1542  AST 18  ALT 9  ALKPHOS 92  BILITOT 0.7  PROT 7.2  ALBUMIN 3.1*   No results for input(s): LIPASE, AMYLASE in the last 168 hours. CBC  Recent Labs Lab 03/23/14 1155 03/25/14 1542 03/25/14 1555  WBC 2.9* 2.8*  --   NEUTROABS 1.7 1.6*  --   HGB 8.7* 8.9* 10.5*  HCT 27.7* 28.4* 31.0*  MCV 106.1* 101.4*  --   PLT 112* 91*  --    Basic Metabolic Panel  Recent Labs Lab 03/23/14 1155 03/25/14 1542 03/25/14 1555  NA 142 141 141  K 3.8 4.3 4.2  CL 103 98 99  CO2 28 26  --   GLUCOSE 78 99 98  BUN 43* 57* 51*  CREATININE 10.74* 15.01* 13.80*  CALCIUM 9.0 8.6  --     Filed Vitals:   03/25/14 1545 03/25/14 1630 03/25/14 1738 03/25/14 1844  BP: 175/73 173/76 173/84 155/75  Pulse:  68 74 71  Temp:   98.4 F (36.9 C) 98.3 F (36.8 C)  TempSrc:   Oral Oral  Resp: 15  16 16   SpO2:  96% 100% 100%   Exam Alert, no distress No rash, cyanosis or gangrene Sclera anicteric, throat clear +mild JVD Chest clear throughout RRR no MRG Abd soft, NTND, no mass or  ascites No LE or UE edema, except for swelling, mild erythema and tenderness over R forearm AV fistula Neuro is NF, Ox 3  HD: MWF DR Befakadu   Assessment: 1. 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 See Dr Lianne Moris notes.   2. ESRD on HD - missed last HD on Fri 3. HIV on meds 4. 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 Anemia on EPO   Plan- will d/w Dr Bridgett Larsson, she is relatively stable and without gross vol overload or electrolyte imbalance. OK to observe for a day or so until good access is established vs possible HD tomorrow if not going to OR.   Kelly Splinter MD (pgr) [phone]    [phone] 03/25/2014, 8:51 PM

## 2014-03-25 NOTE — Consult Note (Signed)
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   Dr Baxter Flattery will f/u tomorrow  Thank you so much for this interesting consult and for taking excellent care of [name], [name] (pager) [phone] [email or web address]  [name] is an 64 y.o. female.  HPI: 64 yo F with HIV+, ESRD, staying at SNF. She is on ISN/AZT/3TC dosed for her HD. Her last CD4 was 300 (02-20-14) and her HIV RNA was <20 (12-22-13).  She was last seen in ID clinic on 02-20-14 and was doing well with the exception of n/v. She complained of pain at her HD site at that time.  She comes to hospital today with 4 days of increasing pain in her R arm. She was seen in ED on 1-22 and was d/c back to her SNF to continue on vanco with HD and po cipro. Doppler (-) for DVT, as well as being (-) for fluid collections. Today she was found to have an open wound with a small amt of purulence as well as erythema and edema of her entire arm. She was hypertensive but afebrile.    Past Medical History  Diagnosis Date  . Pulmonary HTN   . Hypertension   . Depression   . Insomnia   . Chronic diarrhea   . HIV (human immunodeficiency virus infection)   . C. difficile colitis 10/10/11  . Muscle weakness (generalized)   . Intracranial hemorrhage   . Anxiety   . Anemia   . ESRD on hemodialysis     Past Surgical History  Procedure Laterality Date    . Dialysis shunts      previous one removed from left arm and now present one in right arm  . Esophagogastroduodenoscopy  12/15/2010    Procedure: ESOPHAGOGASTRODUODENOSCOPY (EGD);  Surgeon: Daneil Dolin, MD;  Location: [facility];  Service: Endoscopy;  Laterality: N/A;  . Tonsillectomy    . Biopsy thyroid    . Excision of breast biopsy Right 05/04/2012    Procedure: EXCISION OF BREAST BIOPSY;  Surgeon: Donato Heinz, MD;  Location: [facility] ORS;  Service: General;  Laterality: Right;  Right Excisional Breast Biopsy  . Shuntogram Right 10/19/2013    Procedure: FISTULOGRAM;  Surgeon: Conrad [place], MD;  Location: [facility] CATH LAB;  Service: Cardiovascular;  Laterality: Right;     Allergies  Allergen Reactions  . Penicillins     REACTION: rash  . Latex Rash    Medications:  Scheduled: . cefTAZidime (FORTAZ)  IV  2 g Intravenous Q M,W,F  . vancomycin  1,250 mg Intravenous Once  . [START ON 03/26/2014] vancomycin  500 mg Intravenous Q M,W,F-HD  . [START ON 03/26/2014] vancomycin  1,000 mg Intravenous To OR    Abtx:  Anti-infectives    Start     Dose/Rate Route Frequency Ordered Stop   03/26/14 1200  vancomycin (VANCOCIN) 500 mg in sodium chloride 0.9 % 100 mL IVPB  500 mg100 mL/hr over 60 Minutes Intravenous Every M-W-F (Hemodialysis) 03/25/14 1648     03/26/14 0000  vancomycin (VANCOCIN) IVPB 1000 mg/200 mL premix     1,000 mg200 mL/hr over 60 Minutes Intravenous To Surgery 03/25/14 1932 03/27/14 0000   03/25/14 2000  vancomycin (VANCOCIN) 1,250 mg in sodium chloride 0.9 % 250 mL IVPB     1,250 mg166.7 mL/hr over 90 Minutes Intravenous  Once 03/25/14 1646     03/25/14 1845  cefTAZidime (FORTAZ) 2 g in dextrose 5 % 50 mL IVPB     2 g100 mL/hr over 30 Minutes Intravenous Every M-W-F 03/25/14 1807     03/25/14 1800  piperacillin-tazobactam (ZOSYN) IVPB 2.25 g  Status:  Discontinued     2.25 g100 mL/hr over 30 Minutes Intravenous 3 times per day 03/25/14 1646 03/25/14 1804      Total  days of antibiotics 0 (vanco/ceftaz)          Social History:  reports that she has quit smoking. She has never used smokeless tobacco. She reports that she does not drink alcohol or use illicit drugs.  Family History  Problem Relation Age of Onset  . Anesthesia problems Neg Hx   . Hypotension Neg Hx   . Malignant hyperthermia Neg Hx   . Pseudochol deficiency Neg Hx     General ROS: she denies f/c. makes small amt urine, has had decreased apetite. see HPI.   Blood pressure 155/75, pulse 71, temperature 98.3 F (36.8 C), temperature source Oral, resp. rate 16, SpO2 100 %. General appearance: alert, cooperative and no distress Eyes: negative findings: conjunctivae and sclerae normal and pupils equal, round, reactive to light and accomodation Throat: normal findings: oropharynx pink & moist without lesions or evidence of thrush and abnormal findings: no upper dentition Neck: no adenopathy and supple, symmetrical, trachea midline Lungs: clear to auscultation bilaterally Heart: regular rate and rhythm Abdomen: normal findings: bowel sounds normal and soft, non-tender Extremities: edema none in LE. and RUE grossly swollen. + bruit at HD site. there is a large scab here and her arm has tense edema. her arm is very tender near the HD site. increased heat.    Results for orders placed or performed during the hospital encounter of 03/25/14 (from the past 48 hour(s))  CBC with Differential     Status: Abnormal   Collection Time: 03/25/14  3:42 PM  Result Value Ref Range   WBC 2.8 (L) 4.0 - 10.5 K/uL   RBC 2.80 (L) 3.87 - 5.11 MIL/uL   Hemoglobin 8.9 (L) 12.0 - 15.0 g/dL   HCT 28.4 (L) 36.0 - 46.0 %   MCV 101.4 (H) 78.0 - 100.0 fL   MCH 31.8 26.0 - 34.0 pg   MCHC 31.3 30.0 - 36.0 g/dL   RDW 14.9 11.5 - 15.5 %   Platelets 91 (L) 150 - 400 K/uL    Comment: PLATELET COUNT CONFIRMED BY SMEAR   Neutrophils Relative % 58 43 - 77 %   Neutro Abs 1.6 (L) 1.7 - 7.7 K/uL   Lymphocytes Relative 27  12 - 46 %   Lymphs Abs 0.8 0.7 - 4.0 K/uL   Monocytes Relative 10 3 - 12 %   Monocytes Absolute 0.3 0.1 - 1.0 K/uL   Eosinophils Relative 5 0 - 5 %   Eosinophils Absolute 0.1 0.0 - 0.7 K/uL   Basophils Relative 0 0 - 1 %   Basophils Absolute 0.0 0.0 - 0.1 K/uL  Comprehensive metabolic panel  Status: Abnormal   Collection Time: 03/25/14  3:42 PM  Result Value Ref Range   Sodium 141 135 - 145 mmol/L   Potassium 4.3 3.5 - 5.1 mmol/L   Chloride 98 96 - 112 mmol/L   CO2 26 19 - 32 mmol/L   Glucose, Bld 99 70 - 99 mg/dL   BUN 57 (H) 6 - 23 mg/dL   Creatinine, Ser 15.01 (H) 0.50 - 1.10 mg/dL   Calcium 8.6 8.4 - 10.5 mg/dL   Total Protein 7.2 6.0 - 8.3 g/dL   Albumin 3.1 (L) 3.5 - 5.2 g/dL   AST 18 0 - 37 U/L   ALT 9 0 - 35 U/L   Alkaline Phosphatase 92 39 - 117 U/L   Total Bilirubin 0.7 0.3 - 1.2 mg/dL   GFR calc non Af Amer 2 (L) >90 mL/min   GFR calc Af Amer 3 (L) >90 mL/min    Comment: (NOTE) The eGFR has been calculated using the CKD EPI equation. This calculation has not been validated in all clinical situations. eGFR's persistently <90 mL/min signify possible Chronic Kidney Disease.    Anion gap 17 (H) 5 - 15  I-stat troponin, ED     Status: None   Collection Time: 03/25/14  3:53 PM  Result Value Ref Range   Troponin i, poc 0.05 0.00 - 0.08 ng/mL   Comment 3            Comment: Due to the release kinetics of cTnI, a negative result within the first hours of the onset of symptoms does not rule out myocardial infarction with certainty. If myocardial infarction is still suspected, repeat the test at appropriate intervals.   I-stat chem 8, ed     Status: Abnormal   Collection Time: 03/25/14  3:55 PM  Result Value Ref Range   Sodium 141 135 - 145 mmol/L   Potassium 4.2 3.5 - 5.1 mmol/L   Chloride 99 96 - 112 mmol/L   BUN 51 (H) 6 - 23 mg/dL   Creatinine, Ser 13.80 (H) 0.50 - 1.10 mg/dL   Glucose, Bld 98 70 - 99 mg/dL   Calcium, Ion 0.97 (L) 1.13 - 1.30 mmol/L    TCO2 22 0 - 100 mmol/L   Hemoglobin 10.5 (L) 12.0 - 15.0 g/dL   HCT 31.0 (L) 36.0 - 46.0 %  I-Stat CG4 Lactic Acid, ED     Status: None   Collection Time: 03/25/14  3:56 PM  Result Value Ref Range   Lactic Acid, Venous 1.81 0.5 - 2.0 mmol/L      Component Value Date/Time   SDES STOOL 10/08/2011 2031   SPECREQUEST NONE 10/08/2011 2031   CULT  10/08/2011 2031    NO SALMONELLA, SHIGELLA, CAMPYLOBACTER, YERSINIA, OR E.COLI 0157:[id number] ISOLATED   REPTSTATUS 10/13/2011 FINAL 10/08/2011 2031   Dg Chest 2 View  03/25/2014   CLINICAL DATA:  Shortness of breath. Hypertension and pulmonary hypertension.  EXAM: CHEST  2 VIEW  COMPARISON:  12/28/2011  FINDINGS: Mild-to-moderate enlargement of the cardiopericardial silhouette. Prominent central pulmonary vasculature as shown previously. Atherosclerotic aortic arch.  Cephalization of blood flow. Subsegmental atelectasis or scarring in the lingula. No airspace edema observed. No pleural effusion.  Prior right axillary dissection.  Dextroconvex thoracic scoliosis.  IMPRESSION: 1. Cardiomegaly with both pulmonary venous and pulmonary arterial hypertension. No overt pulmonary edema.   Electronically Signed   By: Sherryl Barters M.D.   On: 03/25/2014 16:35   No results found for this or any previous  visit (from the past 240 hour(s)).    03/25/2014, 7:41 PM     LOS: 0 days

## 2014-03-26 ENCOUNTER — Inpatient Hospital Stay (HOSPITAL_COMMUNITY): Payer: Medicare Other | Admitting: Anesthesiology

## 2014-03-26 ENCOUNTER — Encounter (HOSPITAL_COMMUNITY)
Admission: EM | Disposition: A | Discharge status: Skilled Nursing Facility | Payer: Self-pay | Source: Home / Self Care | Attending: Family Medicine

## 2014-03-26 ENCOUNTER — Encounter (HOSPITAL_COMMUNITY): Payer: Self-pay | Admitting: Certified Registered"

## 2014-03-26 DIAGNOSIS — I10 Essential (primary) hypertension: Secondary | ICD-10-CM | POA: Insufficient documentation

## 2014-03-26 DIAGNOSIS — B20 Human immunodeficiency virus [HIV] disease: Secondary | ICD-10-CM | POA: Insufficient documentation

## 2014-03-26 DIAGNOSIS — Z21 Asymptomatic human immunodeficiency virus [HIV] infection status: Secondary | ICD-10-CM | POA: Insufficient documentation

## 2014-03-26 DIAGNOSIS — M7989 Other specified soft tissue disorders: Secondary | ICD-10-CM

## 2014-03-26 HISTORY — PX: FISTULOGRAM: SHX5832

## 2014-03-26 LAB — COMPREHENSIVE METABOLIC PANEL
ALK PHOS: 83 U/L (ref 39–117)
ALT: 9 U/L (ref 0–35)
ANION GAP: 19 — AB (ref 5–15)
AST: 13 U/L (ref 0–37)
Albumin: 2.8 g/dL — ABNORMAL LOW (ref 3.5–5.2)
BILIRUBIN TOTAL: 1 mg/dL (ref 0.3–1.2)
BUN: 60 mg/dL — AB (ref 6–23)
CHLORIDE: 99 mmol/L (ref 96–112)
CO2: 24 mmol/L (ref 19–32)
Calcium: 8.2 mg/dL — ABNORMAL LOW (ref 8.4–10.5)
Creatinine, Ser: 15.62 mg/dL — ABNORMAL HIGH (ref 0.50–1.10)
GFR calc Af Amer: 2 mL/min — ABNORMAL LOW (ref >90)
GFR, EST NON AFRICAN AMERICAN: 2 mL/min — AB (ref >90)
GLUCOSE: 77 mg/dL (ref 70–99)
Potassium: 4.5 mmol/L (ref 3.5–5.1)
SODIUM: 142 mmol/L (ref 135–145)
TOTAL PROTEIN: 6.4 g/dL (ref 6.0–8.3)

## 2014-03-26 LAB — CBC
HEMATOCRIT: 26.1 % — AB (ref 36.0–46.0)
Hemoglobin: 8.4 g/dL — ABNORMAL LOW (ref 12.0–15.0)
MCH: 32.9 pg (ref 26.0–34.0)
MCHC: 32.2 g/dL (ref 30.0–36.0)
MCV: 102.4 fL — ABNORMAL HIGH (ref 78.0–100.0)
PLATELETS: 78 10*3/uL — AB (ref 150–400)
RBC: 2.55 MIL/uL — AB (ref 3.87–5.11)
RDW: 15 % (ref 11.5–15.5)
WBC: 2.1 10*3/uL — AB (ref 4.0–10.5)

## 2014-03-26 LAB — T-HELPER CELLS (CD4) COUNT (NOT AT ARMC)
CD4 T CELL ABS: 140 /uL — AB (ref 400–2700)
CD4 T CELL HELPER: 19 % — AB (ref 33–55)

## 2014-03-26 LAB — PHOSPHORUS: PHOSPHORUS: 11.7 mg/dL — AB (ref 2.3–4.6)

## 2014-03-26 LAB — GLUCOSE, CAPILLARY: Glucose-Capillary: 80 mg/dL (ref 70–99)

## 2014-03-26 LAB — LIPID PANEL
CHOL/HDL RATIO: 2.6 ratio
CHOLESTEROL: 95 mg/dL (ref 0–200)
HDL: 36 mg/dL — AB (ref >39)
LDL Cholesterol: 52 mg/dL (ref 0–99)
Triglycerides: 34 mg/dL (ref <150)
VLDL: 7 mg/dL (ref 0–40)

## 2014-03-26 LAB — MAGNESIUM: Magnesium: 2.1 mg/dL (ref 1.5–2.5)

## 2014-03-26 LAB — SURGICAL PCR SCREEN
MRSA, PCR: NEGATIVE
Staphylococcus aureus: NEGATIVE

## 2014-03-26 SURGERY — FISTULOGRAM
Anesthesia: General | Site: Arm Upper | Laterality: Right

## 2014-03-26 MED ORDER — PROMETHAZINE HCL 25 MG/ML IJ SOLN: 6.2500 mg | INTRAMUSCULAR | Status: DC | PRN

## 2014-03-26 MED ORDER — ONDANSETRON HCL 4 MG/2ML IJ SOLN
INTRAMUSCULAR | Status: DC | PRN
  Administered 2014-03-26: 4 mg via INTRAVENOUS

## 2014-03-26 MED ORDER — LIDOCAINE HCL (CARDIAC) 20 MG/ML IV SOLN
INTRAVENOUS | Status: DC | PRN
  Administered 2014-03-26: 100 mg via INTRAVENOUS

## 2014-03-26 MED ORDER — THROMBIN 20000 UNITS EX SOLR
CUTANEOUS | Status: AC
  Filled 2014-03-26: qty 20000

## 2014-03-26 MED ORDER — FENTANYL CITRATE 0.05 MG/ML IJ SOLN
INTRAMUSCULAR | Status: AC
  Filled 2014-03-26: qty 5

## 2014-03-26 MED ORDER — LIDOCAINE HCL (CARDIAC) 20 MG/ML IV SOLN
INTRAVENOUS | Status: AC
  Filled 2014-03-26: qty 5

## 2014-03-26 MED ORDER — VANCOMYCIN HCL 500 MG IV SOLR
500.0000 mg | INTRAVENOUS | Status: DC
  Administered 2014-03-27: 500 mg via INTRAVENOUS
  Filled 2014-03-26 (×2): qty 500

## 2014-03-26 MED ORDER — VANCOMYCIN HCL IN DEXTROSE 1-5 GM/200ML-% IV SOLN
INTRAVENOUS | Status: AC
  Administered 2014-03-26: 1000 mg via INTRAVENOUS
  Filled 2014-03-26: qty 200

## 2014-03-26 MED ORDER — MIDAZOLAM HCL 5 MG/5ML IJ SOLN
INTRAMUSCULAR | Status: DC | PRN
  Administered 2014-03-26: 2 mg via INTRAVENOUS

## 2014-03-26 MED ORDER — LIDOCAINE-EPINEPHRINE (PF) 1 %-1:200000 IJ SOLN
INTRAMUSCULAR | Status: DC | PRN
  Administered 2014-03-26: 5 mL

## 2014-03-26 MED ORDER — IOHEXOL 300 MG/ML  SOLN
INTRAMUSCULAR | Status: DC | PRN
  Administered 2014-03-26: 25 mL via INTRAVENOUS
  Administered 2014-03-26: 50 mL via INTRAVENOUS

## 2014-03-26 MED ORDER — LIDOCAINE-EPINEPHRINE (PF) 1 %-1:200000 IJ SOLN
INTRAMUSCULAR | Status: AC
  Filled 2014-03-26: qty 10

## 2014-03-26 MED ORDER — SODIUM CHLORIDE 0.9 % IR SOLN
Status: DC | PRN
  Administered 2014-03-26: 500 mL

## 2014-03-26 MED ORDER — PROPOFOL 10 MG/ML IV BOLUS
INTRAVENOUS | Status: AC
  Filled 2014-03-26: qty 20

## 2014-03-26 MED ORDER — PROPOFOL 10 MG/ML IV BOLUS
INTRAVENOUS | Status: DC | PRN
  Administered 2014-03-26: 100 mg via INTRAVENOUS

## 2014-03-26 MED ORDER — FENTANYL CITRATE 0.05 MG/ML IJ SOLN: 25.0000 ug | INTRAMUSCULAR | Status: DC | PRN

## 2014-03-26 MED ORDER — 0.9 % SODIUM CHLORIDE (POUR BTL) OPTIME
TOPICAL | Status: DC | PRN
  Administered 2014-03-26: 1000 mL

## 2014-03-26 MED ORDER — MIDAZOLAM HCL 2 MG/2ML IJ SOLN
INTRAMUSCULAR | Status: AC
  Filled 2014-03-26: qty 2

## 2014-03-26 MED ORDER — BUPIVACAINE HCL (PF) 0.5 % IJ SOLN
INTRAMUSCULAR | Status: DC | PRN
  Administered 2014-03-26: 5 mL

## 2014-03-26 MED ORDER — SODIUM CHLORIDE 0.9 % IV SOLN
INTRAVENOUS | Status: DC
  Administered 2014-03-26: 10:00:00 via INTRAVENOUS

## 2014-03-26 SURGICAL SUPPLY — 65 items
BALLN MUSTANG 10.0X40 75 (BALLOONS) ×2
BALLN MUSTANG 4X80X75 (BALLOONS) ×2
BALLN MUSTANG 6X100X135 (BALLOONS) ×2
BALLN MUSTANG 8X100X135 (BALLOONS) ×2
BALLOON MUSTANG 10.0X40 75 (BALLOONS) ×1 IMPLANT
BALLOON MUSTANG 4X80X75 (BALLOONS) ×1 IMPLANT
BALLOON MUSTANG 6X100X135 (BALLOONS) ×1 IMPLANT
BALLOON MUSTANG 8X100X135 (BALLOONS) ×1 IMPLANT
BLADE SURG 10 STRL SS (BLADE) ×2 IMPLANT
CANISTER SUCTION 2500CC (MISCELLANEOUS) ×2 IMPLANT
CATH BEACON 5.038 65CM KMP-01 (CATHETERS) ×2 IMPLANT
CATH STRAIGHT 5FR 65CM (CATHETERS) ×2 IMPLANT
CLIP TI MEDIUM 6 (CLIP) ×2 IMPLANT
CLIP TI WIDE RED SMALL 6 (CLIP) ×2 IMPLANT
COVER PROBE W GEL 5X96 (DRAPES) ×2 IMPLANT
COVER SURGICAL LIGHT HANDLE (MISCELLANEOUS) ×2 IMPLANT
DECANTER SPIKE VIAL GLASS SM (MISCELLANEOUS) ×2 IMPLANT
DRAIN PENROSE 1/2X12 LTX STRL (WOUND CARE) IMPLANT
DRAPE PROXIMA HALF (DRAPES) ×2 IMPLANT
DRSG TEGADERM 4X4.75 (GAUZE/BANDAGES/DRESSINGS) ×4 IMPLANT
ELECT REM PT RETURN 9FT ADLT (ELECTROSURGICAL) ×2
ELECTRODE REM PT RTRN 9FT ADLT (ELECTROSURGICAL) ×1 IMPLANT
GAUZE XEROFORM 1X8 LF (GAUZE/BANDAGES/DRESSINGS) ×2 IMPLANT
GEL ULTRASOUND 20GR AQUASONIC (MISCELLANEOUS) ×2 IMPLANT
GLOVE BIO SURGEON STRL SZ7 (GLOVE) ×2 IMPLANT
GLOVE BIOGEL PI IND STRL 6.5 (GLOVE) ×1 IMPLANT
GLOVE BIOGEL PI IND STRL 7.5 (GLOVE) ×2 IMPLANT
GLOVE BIOGEL PI INDICATOR 6.5 (GLOVE) ×1
GLOVE BIOGEL PI INDICATOR 7.5 (GLOVE) ×2
GLOVE SKINSENSE NS SZ6.5 (GLOVE) ×1
GLOVE SKINSENSE NS SZ7.0 (GLOVE) ×1
GLOVE SKINSENSE STRL SZ6.5 (GLOVE) ×1 IMPLANT
GLOVE SKINSENSE STRL SZ7.0 (GLOVE) ×1 IMPLANT
GOWN STRL REUS W/ TWL LRG LVL3 (GOWN DISPOSABLE) ×3 IMPLANT
GOWN STRL REUS W/TWL LRG LVL3 (GOWN DISPOSABLE) ×3
INTRODUCER COOK 11FR (CATHETERS) IMPLANT
INTRODUCER SET COOK 14FR (MISCELLANEOUS) IMPLANT
KIT BASIN OR (CUSTOM PROCEDURE TRAY) ×2 IMPLANT
KIT ENCORE 26 ADVANTAGE (KITS) ×2 IMPLANT
KIT ROOM TURNOVER OR (KITS) ×2 IMPLANT
LIQUID BAND (GAUZE/BANDAGES/DRESSINGS) ×2 IMPLANT
NEEDLE PERC 18GX7CM (NEEDLE) IMPLANT
NS IRRIG 1000ML POUR BTL (IV SOLUTION) ×2 IMPLANT
PACK CV ACCESS (CUSTOM PROCEDURE TRAY) ×2 IMPLANT
PAD ARMBOARD 7.5X6 YLW CONV (MISCELLANEOUS) ×4 IMPLANT
PROBE PENCIL 8 MHZ STRL DISP (MISCELLANEOUS) IMPLANT
SET INTRODUCER 12FR PACEMAKER (SHEATH) IMPLANT
SET MICROPUNCTURE 5F STIFF (MISCELLANEOUS) ×2 IMPLANT
SHEATH AVANTI 11CM 5FR (MISCELLANEOUS) ×2 IMPLANT
SHEATH PINNACLE 6F 10CM (SHEATH) ×4 IMPLANT
SHEATH PINNACLE R/O II 6F 4CM (SHEATH) ×2 IMPLANT
SPONGE GAUZE 4X4 12PLY STER LF (GAUZE/BANDAGES/DRESSINGS) ×4 IMPLANT
SPONGE SURGIFOAM ABS GEL 100 (HEMOSTASIS) IMPLANT
SUT MNCRL AB 4-0 PS2 18 (SUTURE) ×2 IMPLANT
SUT PROLENE 6 0 BV (SUTURE) ×2 IMPLANT
SUT PROLENE 7 0 BV 1 (SUTURE) IMPLANT
SUT SILK 2 0 FS (SUTURE) IMPLANT
SUT VIC AB 3-0 SH 27 (SUTURE) ×1
SUT VIC AB 3-0 SH 27X BRD (SUTURE) ×1 IMPLANT
SYR 20CC LL (SYRINGE) ×2 IMPLANT
TAPE CLOTH SURG 4X10 WHT LF (GAUZE/BANDAGES/DRESSINGS) ×4 IMPLANT
UNDERPAD 30X30 INCONTINENT (UNDERPADS AND DIAPERS) ×2 IMPLANT
WATER STERILE IRR 1000ML POUR (IV SOLUTION) ×2 IMPLANT
WIRE BENTSON .035X145CM (WIRE) ×4 IMPLANT
WIRE HI TORQ VERSACORE J 260CM (WIRE) ×4 IMPLANT

## 2014-03-26 NOTE — Op Note (Addendum)
OPERATIVE NOTE   PROCEDURE: 1.  right radiocephalic arteriovenous fistula cannulation under ultrasound guidance 2.  right arm fistulogram 3.  Venoplasty of subclavian/innominate vein x 4 (4 mm x 80 mm, 6 mm x 100 mm, 8 mm x 100 mm, 10 mm x 40 mm) 4.  Venoplasty of superior vena cava (4 mm x 80 mm, 6 mm x 100 mm, 8 mm x 100 mm, 10 mm x 40 mm) 5.  Debridement of right radiocephalic arteriovenous fistula ulcer  PRE-OPERATIVE DIAGNOSIS: Central venous stenosis, ulcer overlying right radiocephalic arteriovenous fistula    POST-OPERATIVE DIAGNOSIS: same as above   SURGEON: Adele Barthel, MD  ANESTHESIA: local  ESTIMATED BLOOD LOSS: 5 cc  FINDING(S): 1. High grade stenosis of superior vena cava (9 mm residual lumen) 2. 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 Good thrill at end of case 4. Intact fistula underlying skin ulcer with intact subcutaneous tissue without evidence of underlying infection  SPECIMEN(S):  None  CONTRAST: 50 cc  INDICATIONS: [name] is a 64 y.o. female who presents with right swollen arm with central venous stenosis in the setting of known right subclavian/innominate vein stenosis and ulcer overlying right radiocephalic arteriovenous fistula.  The patient is scheduled for right arm fistulogram, possible intervention, possible ligation or revision of radiocephalic arteriovenous fistula, and possible tunneled dialysis catheter placement.  The patient is aware the risks include but are not limited to: bleeding, infection, thrombosis of the cannulated access, and possible anaphylactic reaction to the contrast.  The patient is aware of the risks of the procedure and elects to proceed forward.  The patient is aware the risks of tunneled dialysis catheter placement include but are not limited to: bleeding, infection, central venous injury, pneumothorax, possible venous stenosis, possible malpositioning in the venous  system, and possible infections related to long-term catheter presence. The patient was aware of these risks and agreed to proceed.   DESCRIPTION: After full informed written consent was obtained, the patient was brought back to the angiography suite and placed supine upon the angiography table.  The patient was connected to monitoring equipment.  The right forearm was prepped and draped in the standard fashion for a percutaneous access intervention.  Under ultrasound guidance, the right radiocephalic arteriovenous fistula arteriovenous was cannulated with a micropuncture needle proximal to the ulcer.  The microwire was advanced into the fistula and the needle was exchanged for the a microsheath, which was lodged 2 cm into the access.  The wire was removed and the sheath was connected to the IV extension tubing.  Hand injections were completed to image the access from the antecubitum up to the level of axilla.  The central venous structures were also imaged by hand injections.  Based on the images, this patient will need: attempt at venoplasty.  A [name] was advanced into the axillary vein and the sheath was exchanged for a 5-Fr sheath.  Using a KMP catheter with a [name], I crossed the subclavian vein and superior vena cava stenosis.   Based on the imaging, a 4 mm x 80 mm angioplasty balloon was selected.  The balloon was centered around the two stenoses and inflated to 10 ATM for 1 minute.  I then exchanged the wire for a Versacore wire as the [name] was a marginal length  for central venoplasty.  At this point, the balloon was exchanged for a 6 mm x 100 mm angioplasty balloon.  The balloon  was centered around the stenoses and inflated to 18 ATM for 2 minutes.  On completion imaging, a >30% residual stenosis was present. At this point, the balloon was exchanged for a 8 mm x 100 mm angioplasty balloon.  The balloon was centered around the stenoses and inflated to 18 ATM for 2 minutes.  On completion  imaging, a >30% residual stenosis was present.  At this point, the balloon was exchanged for a 10 mm x 40 mm angioplasty balloon.  The balloon was centered around the stenoses and inflated to 12 ATM for 2 minutes.  On completion imaging, there was a residual lumen of 6 mm in the subclavian/innominate vein stenosis and a 9 mm lumen in the superior vena cava.  From my prior procedures, this was the upper end of what I was willing to dilate this patient's subclavian/innominate vein due to severe pain suggestive of imminent rupture.  Based on the completion imaging, no further intervention is necessary and there was no rupture.  The wire and balloon were removed from the sheath.  A 4-0 Monocryl purse-string suture was sewn around the sheath.  The sheath was removed while tying down the suture.  A sterile bandage was applied to the puncture site.   I then used a 10-blade and transected the scab in the overlying ulcer over her fistula.  The underlying subcutaneous tissue appeared intact without any active bleeding.  The defect was too big to simply close with sutures.  Will attempt to close this through secondary intent with wound care.  I placed Xeroform over this exposed subcutaneous tissue and applied a pressure dressing over this segment of the fistula.  At the end of this case, the fistula had a greatly improved thrill.  If this patient develops any bleeding from this ulcerated area, she will need ligation of her fistula.  COMPLICATIONS: none  CONDITION: stable   Adele Barthel, MD [facility] Office: [phone] Pager: [phone]  03/26/2014 11:46 AM

## 2014-03-26 NOTE — Interval H&P Note (Signed)
History and Physical Interval Note:  03/26/2014 10:01 AM  [name]  has presented today for surgery, with the diagnosis of Right Arm Cellulitis  The various methods of treatment have been discussed with the patient and family. After consideration of risks, benefits and other options for treatment, the patient has consented to  Procedure(s): FISTULOGRAM (Right) VENOPLASTY (poss. R RC AVF ligation/revision, poss. TDC placement (Right) as a surgical intervention .  The patient's history has been reviewed, patient examined, no change in status, stable for surgery.  I have reviewed the patient's chart and labs.  Questions were answered to the patient's satisfaction.     CHEN,BRIAN LIANG-YU

## 2014-03-26 NOTE — Anesthesia Postprocedure Evaluation (Signed)
  Anesthesia Post-op Note  Patient: [name]  Procedure(s) Performed: Procedure(s) (LRB): Right Arm Fistulogram with Venoplasty Right Subclavian Vein and Inominate Vein. Debridement Fistula Ulcer (Right)  Patient Location: PACU  Anesthesia Type: General  Level of Consciousness: awake and alert   Airway and Oxygen Therapy: Patient Spontanous Breathing  Post-op Pain: mild  Post-op Assessment: Post-op Vital signs reviewed, Patient's Cardiovascular Status Stable, Respiratory Function Stable, Patent Airway and No signs of Nausea or vomiting  Last Vitals:  Filed Vitals:   03/26/14 1210  BP: 155/79  Pulse: 57  Temp:   Resp: 12    Post-op Vital Signs: stable   Complications: No apparent anesthesia complications
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
## 2014-03-26 NOTE — Progress Notes (Signed)
[facility] Progress Note   Subjective: sedated post-procedure  Filed Vitals:   03/26/14 1210 03/26/14 1222 03/26/14 1224 03/26/14 1231  BP: 155/79 145/77    Pulse: 57  62 62  Temp:    97.2 F (36.2 C)  TempSrc:      Resp: 12  15 20   SpO2: 100%  97% 97%   Exam: Alert, no distress No rash, cyanosis or gangrene Sclera anicteric, throat clear +mild JVD Chest clear throughout RRR no MRG Abd soft, NTND, no mass or ascites No LE or UE edema , L arm wrapped post -fistulogram Neuro is NF, Ox 3  HD: MWF [facility] Naplate RFA AVF   2/2.5 [place]  3h  350/600   53kg   500u/hr stopped 60 min before end of rx EPO 7000/ hd,  Hect 14 ug TIW   Assessment: 1. RUE edema - now s/p angioplasty of recurrent central venous stenosis. Eschar debribed also.  Not infected.    2. ESRD on HD - missed last HD on Fri 3. HIV on meds 4. 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 Anemia on EPO   Plan - will plan for HD in am tomorrow, then she may be OK for dc thereafter to resume MWF OP schedule in [place].     Kelly Splinter MD  pager [phone]    cell [phone]  03/26/2014, 12:46 PM     Recent Labs Lab 03/23/14 1155 03/25/14 1542 03/25/14 1555 03/26/14 0523  NA 142 141 141 142  K 3.8 4.3 4.2 4.5  CL 103 98 99 99  CO2 28 26  --  24  GLUCOSE 78 99 98 77  BUN 43* 57* 51* 60*  CREATININE 10.74* 15.01* 13.80* 15.62*  CALCIUM 9.0 8.6  --  8.2*  PHOS  --   --   --  11.7*    Recent Labs Lab 03/25/14 1542 03/26/14 0523  AST 18 13  ALT 9 9  ALKPHOS 92 83  BILITOT 0.7 1.0  PROT 7.2 6.4  ALBUMIN 3.1* 2.8*    Recent Labs Lab 03/23/14 1155 03/25/14 1542 03/25/14 1555 03/26/14 0523  WBC 2.9* 2.8*  --  2.1*  NEUTROABS 1.7 1.6*  --   --   HGB 8.7* 8.9* 10.5* 8.4*  HCT 27.7* 28.4* 31.0* 26.1*  MCV 106.1* 101.4*  --  102.4*  PLT 112* 91*  --  78*   . [MAR Hold] buPROPion  150 mg Oral Daily  . [MAR Hold] cefTAZidime (FORTAZ)  IV  2 g Intravenous Q M,W,F   . [MAR Hold] clindamycin (CLEOCIN) IV  600 mg Intravenous 3 times per day  . [MAR Hold] diltiazem  60 mg Oral 3 times per day  . [MAR Hold] DULoxetine  90 mg Oral Daily  . [MAR Hold] fentaNYL  50 mcg Transdermal Q72H  . [MAR Hold] heparin  5,000 Units Subcutaneous 3 times per day  . [MAR Hold] lamiVUDine  50 mg Oral Daily  . [MAR Hold] metoprolol  50 mg Oral BID  . [MAR Hold] multivitamin  1 tablet Oral QHS  . [MAR Hold] pantoprazole  40 mg Oral Daily  . [MAR Hold] polyvinyl alcohol  1 drop Both Eyes BID  . [MAR Hold] raltegravir  400 mg Oral BID  . [MAR Hold] sevelamer carbonate  800-1,600 mg Oral TID WC  . [MAR Hold] sildenafil  20 mg Oral TID  . [MAR Hold] sodium chloride  3 mL Intravenous Q12H  . [facility] Hold]  sodium chloride  3 mL Intravenous Q12H  . [MAR Hold] vancomycin  500 mg Intravenous Q M,W,F-HD  . [MAR Hold] vitamin C  500 mg Oral BID  . [MAR Hold] zidovudine  100 mg Oral TID   . sodium chloride 10 mL/hr at 03/26/14 0937   [facility] Hold] sodium chloride, fentaNYL, [MAR Hold] LORazepam, [MAR Hold] ondansetron **OR** [MAR Hold] ondansetron (ZOFRAN) IV, [MAR Hold] oxyCODONE, promethazine, [MAR Hold] sodium chloride, [MAR Hold] zolpidem

## 2014-03-26 NOTE — Progress Notes (Signed)
OT Cancellation Note  Patient Details Name: [name] MRN: [id number] DOB: Jul 01, 1950   Cancelled Treatment:    Reason Eval/Treat Not Completed: Patient at procedure or test/ unavailable. Pt hav e fistulogram procedure.  Almon Register [id number] 03/26/2014, 11:51 AM

## 2014-03-26 NOTE — H&P (View-Only) (Signed)
Established Dialysis Access  History of Present Illness  [name] is a 64 y.o. (1950/04/14) female who presents for possible infected R arm fistula.  The patient is right hand dominant.  Previous access procedures have been completed in both arms.  The patient's complication from previous access procedures include: thrombosis.  This patient previous underwent a venoplasty of her right subclavian vein and innominate vein (10/19/13).  The patient has never had a previous PPM placed.  Pt has been on abx for reported cellulitis in R arm.  Past Medical History  Diagnosis Date  . Pulmonary HTN   . Hypertension   . Depression   . Insomnia   . Chronic diarrhea   . HIV (human immunodeficiency virus infection)   . C. difficile colitis 10/10/11  . Muscle weakness (generalized)   . Intracranial hemorrhage   . Anxiety   . Anemia   . ESRD on hemodialysis     Past Surgical History  Procedure Laterality Date  . Dialysis shunts      previous one removed from left arm and now present one in right arm  . Esophagogastroduodenoscopy  12/15/2010    Procedure: ESOPHAGOGASTRODUODENOSCOPY (EGD);  Surgeon: Daneil Dolin, MD;  Location: [facility] ENDO SUITE;  Service: Endoscopy;  Laterality: N/A;  . Tonsillectomy    . Biopsy thyroid    . Excision of breast biopsy Right 05/04/2012    Procedure: EXCISION OF BREAST BIOPSY;  Surgeon: Donato Heinz, MD;  Location: [facility] ORS;  Service: General;  Laterality: Right;  Right Excisional Breast Biopsy  . Shuntogram Right 10/19/2013    Procedure: FISTULOGRAM;  Surgeon: Conrad Peterson, MD;  Location: [facility] CATH LAB;  Service: Cardiovascular;  Laterality: Right;    History   Social History  . Marital Status: Widowed    Spouse Name: N/A    Number of Children: N/A  . Years of Education: N/A   Occupational History  . Not on file.   Social History Main Topics  . Smoking status: Former Research scientist (life sciences)  . Smokeless tobacco: Never Used  . Alcohol Use: No  . Drug Use: No  .  Sexual Activity: Not Currently   Other Topics Concern  . Not on file   Social History Narrative    Family History  Problem Relation Age of Onset  . Anesthesia problems Neg Hx   . Hypotension Neg Hx   . Malignant hyperthermia Neg Hx   . Pseudochol deficiency Neg Hx     No current facility-administered medications on file prior to encounter.   Current Outpatient Prescriptions on File Prior to Encounter  Medication Sig Dispense Refill  . buPROPion (WELLBUTRIN XL) 150 MG 24 hr tablet Take 150 mg by mouth daily.    . ciprofloxacin (CIPRO) 500 MG tablet Take 1 tablet (500 mg total) by mouth 2 (two) times daily. One po bid x 7 days 14 tablet 0  . diltiazem (CARDIZEM) 60 MG tablet Take 60 mg by mouth every 8 (eight) hours.    . DULoxetine (CYMBALTA) 30 MG capsule Take 30 mg by mouth daily. Take with 60mg  capsule for total of 90 mg.    . DULoxetine (CYMBALTA) 60 MG capsule Take 60 mg by mouth daily. Take with 30mg  capsule for a total of 90mg     . epoetin alfa (EPOGEN,PROCRIT) 29562 UNIT/ML injection Inject 8,000 Units into the skin 3 (three) times a week. To be given at dialysis, Monday Wednesday and Friday    . fentaNYL (DURAGESIC - DOSED  MCG/HR) 50 MCG/HR Apply one patch every 72 hours for pain. Remove old patch. External use only. Rotate sites. 10 patch 0  . hydroxypropyl methylcellulose (ISOPTO TEARS) 2.5 % ophthalmic solution Place 1 drop into both eyes 2 (two) times daily.      Marland Kitchen ipratropium-albuterol (DUONEB) 0.5-2.5 (3) MG/3ML SOLN Take 3 mLs by nebulization every 6 (six) hours as needed. For shortness of breath/ wheezing    . lamivudine (EPIVIR) 100 MG tablet Take 0.5 tablets (50 mg total) by mouth daily. Take one half tablet daily 150 tablet 90  . lidocaine-prilocaine (EMLA) cream Apply 1 application topically 3 (three) times a week. Apply 1 hour prior to dialysis access site on Monday Wednesday and Friday    . LORazepam (ATIVAN) 0.5 MG tablet Take 0.25 mg by mouth every 6 (six) hours  as needed for anxiety.    . metoprolol (LOPRESSOR) 50 MG tablet Take 50 mg by mouth 2 (two) times daily.    . multivitamin (RENA-VIT) TABS tablet Take 1 tablet by mouth at bedtime.    Marland Kitchen omeprazole (PRILOSEC) 20 MG capsule Take 40 mg by mouth daily.    . ondansetron (ZOFRAN) 4 MG tablet Take 2 mg by mouth 3 (three) times daily. nausea    . oxyCODONE (OXY IR/ROXICODONE) 5 MG immediate release tablet Take 5 mg by mouth every 6 (six) hours as needed for moderate pain.     Marland Kitchen oxycodone (OXY-IR) 5 MG capsule Take 5 mg by mouth at bedtime.    . raltegravir (ISENTRESS) 400 MG tablet Take 400 mg by mouth 2 (two) times daily.     . sevelamer carbonate (RENVELA) 800 MG tablet Take 800-1,600 mg by mouth 3 (three) times daily with meals.     . sildenafil (REVATIO) 20 MG tablet Take 20 mg by mouth 3 (three) times daily.     . vitamin C (ASCORBIC ACID) 500 MG tablet Take 500 mg by mouth 2 (two) times daily.    . zaleplon (SONATA) 5 MG capsule Take 5 mg by mouth at bedtime.    . zidovudine (RETROVIR) 100 MG capsule Take 100 mg by mouth 3 (three) times daily.        Allergies  Allergen Reactions  . Penicillins     REACTION: rash  . Latex Rash    REVIEW OF SYSTEMS:  (Positives checked otherwise negative)  CARDIOVASCULAR:  []  chest pain, []  chest pressure, []  palpitations, []  shortness of breath when laying flat, []  shortness of breath with exertion,  []  pain in feet when walking, []  pain in feet when laying flat, []  history of blood clot in veins (DVT), []  history of phlebitis, [x]  swelling in R arm, []  varicose veins  PULMONARY:  []  productive cough, []  asthma, []  wheezing  NEUROLOGIC:  []  weakness in arms or legs, []  numbness in arms or legs, []  difficulty speaking or slurred speech, []  temporary loss of vision in one eye, []  dizziness  HEMATOLOGIC:  []  bleeding problems, []  problems with blood clotting too easily  MUSCULOSKEL:  []  joint pain, []  joint swelling, [x]  abx for cellulitis  GASTROINTEST:   []  vomiting blood, []  blood in stool     GENITOURINARY:  []  burning with urination, []  blood in urine  PSYCHIATRIC:  []  history of major depression  INTEGUMENTARY:  []  rashes, []  ulcers    Physical Examination  Filed Vitals:   03/25/14 1545 03/25/14 1630 03/25/14 1738 03/25/14 1844  BP: 175/73 173/76 173/84 155/75  Pulse:  68 74 71  Temp:   98.4 F (36.9 C) 98.3 F (36.8 C)  TempSrc:   Oral Oral  Resp: 15  16 16   SpO2:  96% 100% 100%   There is no weight on file to calculate BMI.  General: A&O x 3, WD, obese, facies symmetric  Pulmonary: Sym exp, good air movt, CTAB, no rales, rhonchi, & wheezing  Cardiac: RRR, Nl S1, S2, no Murmurs, rubs or gallops  Vascular: Vessel Right Left  Radial Palpable Palpable  Ulnar Not Palpable Not Palpable  Brachial Palpable Palpable   Gastrointestinal: soft, NTND, -G/R, - HSM, - masses, - CVAT B  Musculoskeletal: M/S 5/5 throughout , Extremities without  ischemic changes , + palpable thrill in access in R forearm, + bruit in access, eschar overlying fistula, + edema throughout R arm, no obvious erythematous streaking or TTP overlying fistula  Neurologic: Pain and light touch intact in extremities , Motor exam as listed above  Laboratory: CBC:    Component Value Date/Time   WBC 2.8* 03/25/2014 1542   RBC 2.80* 03/25/2014 1542   HGB 10.5* 03/25/2014 1555   HCT 31.0* 03/25/2014 1555   PLT 91* 03/25/2014 1542   MCV 101.4* 03/25/2014 1542   MCH 31.8 03/25/2014 1542   MCHC 31.3 03/25/2014 1542   RDW 14.9 03/25/2014 1542   LYMPHSABS 0.8 03/25/2014 1542   MONOABS 0.3 03/25/2014 1542   EOSABS 0.1 03/25/2014 1542   BASOSABS 0.0 03/25/2014 1542    BMP:    Component Value Date/Time   NA 141 03/25/2014 1555   K 4.2 03/25/2014 1555   CL 99 03/25/2014 1555   CO2 26 03/25/2014 1542   GLUCOSE 98 03/25/2014 1555   BUN 51* 03/25/2014 1555   CREATININE 13.80* 03/25/2014 1555   CALCIUM 8.6 03/25/2014 1542   CALCIUM 9.0 03/12/2011 0527    GFRNONAA 2* 03/25/2014 1542   GFRAA 3* 03/25/2014 1542    Coagulation: Lab Results  Component Value Date   INR 1.14 12/28/2011   INR 1.10 12/13/2010   INR 1.10 01/26/2010   No results found for: PTT   Medical Decision Making  [name] is a 64 y.o. 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  These lesion require serial dilation q3-6 months to maintain lumen.  This is the likely cause of her R arm swelling  I don't see any signs of infection in the fistula.  In general, AVF are resistant infection.  The patient has erosions overlying the fistula that can result in torrential bleeding.  I recommend:  1. R arm fistulogram, possible R SCV/innominate venoplasty 2. If the R innominate vein is occluded, I would ligate the R RC AVF as the arm swelling will continue indefinitely if not worsen. 3. If R SCV/Innominate stenosis can be dilated, then I would try to salvage the R RC AVF by resecting the ulcerated skin if not directly involving the fistula.  It is possible that this will not be possible and the fistula will need to ligate. 4. If the fistula needs to ligated, I proceed with a TDC placement.  If the L IJV is occluded, unfortunately a femoral tunneled dialysis catheter will need to be placed.  Will try to post this for the hybrid OR tomorrow, but might not be able to get OR space, so might  be Tuesday at the earliest.  Adele Barthel, MD [facility] Office: [phone] Pager: [phone]  03/25/2014, 7:17 PM

## 2014-03-26 NOTE — Anesthesia Procedure Notes (Signed)
Procedure Name: LMA Insertion Date/Time: 03/26/2014 10:16 AM Performed by: Melina Copa, Darnelle [employer] R Pre-anesthesia Checklist: Patient identified, Emergency Drugs available, Suction available, Patient being monitored and Timeout performed Patient Re-evaluated:Patient Re-evaluated prior to inductionOxygen Delivery Method: Circle system utilized Preoxygenation: Pre-oxygenation with 100% oxygen Intubation Type: IV induction Ventilation: Mask ventilation without difficulty LMA: LMA inserted LMA Size: 4.0 Number of attempts: 1 Placement Confirmation: positive ETCO2 and breath sounds checked- equal and bilateral Tube secured with: Tape Dental Injury: Teeth and Oropharynx as per pre-operative assessment

## 2014-03-26 NOTE — Discharge Summary (Signed)
[facility] Discharge Summary  Patient name: [name] Medical record number: [id number] Date of birth: 17-Dec-1950 Age: 64 y.o. Gender: female Date of Admission: 03/25/2014  Date of Discharge: 03/29/2014  Admitting Physician: Alveda Reasons, MD  Primary Care Provider: Harriett Sine, MD Consultants: nephrology, ID, vascular surgery  Indication for Hospitalization: RUE cellulitis vs AVF infection  Discharge Diagnoses/Problem List:  RUE cellulitis AIDS ESRD on HD Pancytopenia Pulmonary HTN HTN Depression/anxiety GERD Chronic pain  Disposition: SNF  Discharge Condition: stable  Discharge Exam:  Filed Vitals:   03/29/14 1131  BP: 115/57  Pulse: 62  Temp: 98.5 F (36.9 C)  Resp: 18   General: chronically ill appearing AAF in HD, NAD HEENT: NCAT, MMM, no dentition Cardiovascular: RRR, no m/r/g. 2+ DP/radial pulses b/l Respiratory: CTAB, no w/r/c. Normal WOB Abdomen: Soft, NDNT, +BS, no HSM or masses, no rebound/guarding Extremities/Skin: No LE edema. Bandage in place, c/d. Erythema and edema of R arm improving. Radial pulse +2 R wrist and C5-T1 intact sensation and muscular   Brief Hospital Course:   CURRY GRUHLKE is a 64 y.o. female presenting with RUE cellulitis vs AVF infection. PMH is significant for AIDS, ESRD on HD MWF, HTN, pulmonary HTN, depression.  RUE cellulitis: Was seen in ED on 1/22 for RUE redness, swelling and pain. Subcutaneous edema of entire arm, no DVT on UE duplex. Was treated for cellulitis with Vanc and PO Cipro after discussion with nephrology and d/c'd back to SNF. Presents with same complaints and no improvement on antibiotics. Did not meet SIRS/sepsis criteria, but WBC 2.8 (ANC 1.6) on admission. Lactic acid normal at 1.8 on admission. Initially concerned for possible R AVF infection.  Site erythematous and edematous with slight purulent drainage from open wound, but neurovascularly intact without concern  for compartment syndrome.  ID, nephrology, and vascular surgery consulted on admission. Started on IV Vancomycin and Ceftazidine and PO Clindamycin per ID recs. Blood cultures were drawn on admission, but patient had already been on antibiotics since 1/22. Pain was controlled with home Fentanyl patch and Oxy IR.  Vascular surgery proceeded to OR on 1/25 for debridement, fistulogram, cannulation and venoplasty.  There was no sign of AVF infection in the OR.  IV antibiotics continued until BCx neg for 72 hours.  Then Ceftaz discontinued.  Per ID, patient is to receive 2 week course of IV Vanc with every HD treatment (1/24 - 2/7).  AIDS: Last CD4 count 300 (12/22). CD4 count 140 on admission. ID followed throughout admission.  Hoem  ARVs (lamivudine, raltegravir, zidovudine) continued throughout admission.  ID recommended starting Bactrim DS at discharge for ppx going forward given CD4 count.  ESRD on HD: Recently missed HD on Friday. No urgent need for HD on admission.  HD resumed on 1/26 after cannulation of AVF.  Nephrology followed throughout admission.  Pancytopenia: Noted WBC 2.8, Hgb 8.9, Platelets 91 on admission. Likely related to AIDS vs iatrogenic from medications. Monitored throughout admission and remained stable.  All other chronic medical conditions stable throughout admission and managed with home regimens.  Issues for Follow Up:  - f/u RUE pain for improvement after recannulation - abx: Needs Vancomycin with HD for 2 wks (starting on 1/24)  - Per pharmacy consult: needs pre-HD Vanc level prior to HD on Saturday to determine if it is safe to redose (pre HD level 15-25, post-HD level 5-15), post-HD dose is 500mg . - f/u HIV RNA - f/u CBC to monitor pancytopenia  Significant Procedures:  R AVF cannulation, R fistulogram, venoplasty of subclavian/innominate vein x4, venoplasty of SVC, debridement of R AVF ulcer with VVS (1/25)  Significant Labs and Imaging:   Recent Labs Lab  03/25/14 1542 03/25/14 1555 03/26/14 0523 03/27/14 0752  WBC 2.8*  --  2.1* 2.8*  HGB 8.9* 10.5* 8.4* 8.4*  HCT 28.4* 31.0* 26.1* 26.8*  PLT 91*  --  78* 100*    Recent Labs Lab 03/23/14 1155 03/25/14 1542 03/25/14 1555 03/26/14 0523 03/27/14 0753  NA 142 141 141 142 140  K 3.8 4.3 4.2 4.5 5.4*  CL 103 98 99 99 100  CO2 28 26  --  24 22  GLUCOSE 78 99 98 77 82  BUN 43* 57* 51* 60* 71*  CREATININE 10.74* 15.01* 13.80* 15.62* 17.87*  CALCIUM 9.0 8.6  --  8.2* 8.3*  MG  --   --   --  2.1  --   PHOS  --   --   --  11.7* 12.8*  ALKPHOS  --  92  --  83  --   AST  --  18  --  13  --   ALT  --  9  --  9  --   ALBUMIN  --  3.1*  --  2.8* 2.8*    Lactic acid - 1.8  CD4 - 140  Imaging/Diagnostic Tests: US Duplex RUE 03/23/14-  Other Findings: Subcutaneous edema is present involving nearly the entire visualized forearm. No focal fluid collections are identified.  IMPRESSION: No evidence of right upper extremity deep venous thrombosis. Open radiocephalic fistula.  EKG 03/25/14 - LAFB, NSR, QTc prolongation   CXR 03/25/14 - Pulmonary HTN, no overt pulmonary edema   Results/Tests Pending at Time of Discharge: BCx (1/24) - NGTD, HIV RNA  Discharge Medications:    Medication List    STOP taking these medications        ciprofloxacin 500 MG tablet  Commonly known as:  CIPRO     lamivudine 100 MG tablet  Commonly known as:  EPIVIR  Replaced by:  lamiVUDine 10 MG/ML solution      TAKE these medications        buPROPion 150 MG 24 hr tablet  Commonly known as:  WELLBUTRIN XL  Take 150 mg by mouth daily.     diltiazem 60 MG tablet  Commonly known as:  CARDIZEM  Take 60 mg by mouth every 8 (eight) hours.     DULoxetine 60 MG capsule  Commonly known as:  CYMBALTA  Take 60 mg by mouth daily. Take with 30mg  capsule for a total of 90mg      DULoxetine 30 MG capsule  Commonly known as:  CYMBALTA  Take 30 mg by mouth daily. Take with 60mg  capsule for total of 90  mg.     epoetin alfa 10000 UNIT/ML injection  Commonly known as:  EPOGEN,PROCRIT  Inject 8,000 Units into the skin 3 (three) times a week. To be given at dialysis, Monday Wednesday and Friday     fentaNYL 50 MCG/HR  Commonly known as:  DURAGESIC - dosed mcg/hr  Apply one patch every 72 hours for pain. Remove old patch. External use only. Rotate sites.     hydroxypropyl methylcellulose / hypromellose 2.5 % ophthalmic solution  Commonly known as:  ISOPTO TEARS / GONIOVISC  Place 1 drop into both eyes 2 (two) times daily.     ipratropium-albuterol 0.5-2.5 (3) MG/3ML Soln  Commonly known as:  DUONEB  Take 3 mLs  by nebulization every 6 (six) hours as needed. For shortness of breath/ wheezing     lamiVUDine 10 MG/ML solution  Commonly known as:  EPIVIR  Take 2.5 mLs (25 mg total) by mouth daily.     lidocaine-prilocaine cream  Commonly known as:  EMLA  Apply 1 application topically 3 (three) times a week. Apply 1 hour prior to dialysis access site on Monday Wednesday and Friday     LORazepam 0.5 MG tablet  Commonly known as:  ATIVAN  Take 0.25 mg by mouth every 6 (six) hours as needed for anxiety.     metoprolol 50 MG tablet  Commonly known as:  LOPRESSOR  Take 50 mg by mouth 2 (two) times daily.     multivitamin Tabs tablet  Take 1 tablet by mouth at bedtime.     omeprazole 20 MG capsule  Commonly known as:  PRILOSEC  Take 40 mg by mouth daily.     ondansetron 4 MG tablet  Commonly known as:  ZOFRAN  Take 2 mg by mouth 3 (three) times daily. nausea     oxyCODONE 5 MG immediate release tablet  Commonly known as:  Oxy IR/ROXICODONE  Take 5 mg by mouth every 6 (six) hours as needed for moderate pain.     raltegravir 400 MG tablet  Commonly known as:  ISENTRESS  Take 400 mg by mouth 2 (two) times daily.     sevelamer carbonate 800 MG tablet  Commonly known as:  RENVELA  Take 800-1,600 mg by mouth 3 (three) times daily with meals.     sildenafil 20 MG tablet  Commonly  known as:  REVATIO  Take 20 mg by mouth 3 (three) times daily.     sulfamethoxazole-trimethoprim 400-80 MG per tablet  Commonly known as:  BACTRIM,SEPTRA  Take 1 tablet by mouth 3 (three) times a week.     vancomycin 500 mg in sodium chloride 0.9 % 100 mL  Inject 500 mg into the vein every dialysis.     vitamin C 500 MG tablet  Commonly known as:  ASCORBIC ACID  Take 500 mg by mouth 2 (two) times daily.     zaleplon 5 MG capsule  Commonly known as:  SONATA  Take 5 mg by mouth at bedtime.     zidovudine 100 MG capsule  Commonly known as:  RETROVIR  Take 100 mg by mouth 3 (three) times daily.        Discharge Instructions: Please refer to Patient Instructions section of EMR for full details.  Patient was counseled important signs and symptoms that should prompt return to medical care, changes in medications, dietary instructions, activity restrictions, and follow up appointments.   Follow-Up Appointments: Follow-up Information    Follow up with [facility] S, MD. Schedule an appointment as soon as possible for a visit in 1 week.   Specialty:  Nephrology   Why:  For hospital follow-up   Contact information:   [address] [address] [phone]       Follow up with Hinda Lenis, MD In 3 days.   Specialty:  Vascular Surgery   Why:  sent message to office   Contact information:   [place] [address] [phone]       Lavon Paganini, MD 03/29/2014, 12:41 PM PGY-1, [place]

## 2014-03-26 NOTE — Progress Notes (Signed)
Family Practice Teaching Service Interval Progress Note  Went to check on patient post procedure. She states she is doing well with some mild pain at her fistula site. I informed her that her fistula did not appear to be infected, though did have some narrowing. She has a palpable thrill at this time. There is mild discomfort on palpation of the area around the the bandage. The bandage overlying the fistula is c/d. Informed her that she has oxycodone for pain as needed. Will continue to monitor over night.   Tommi Rumps, MD Family Medicine PGY-3 Service Pager [phone]

## 2014-03-26 NOTE — Clinical Social Work Psychosocial (Signed)
Clinical Social Work Department BRIEF PSYCHOSOCIAL ASSESSMENT 03/26/2014  Patient:  [name], [name]     Account Number:  [id number]     Admit date:  03/25/2014  Clinical Social Worker:  Daiva Huge  Date/Time:  03/26/2014 03:45 PM  Referred by:  Physician  Date Referred:  03/25/2014 Referred for  SNF Placement   Other Referral:   Interview type:  Other - See comment Other interview type:    PSYCHOSOCIAL DATA Living Status:  FACILITY Admitted from facility:  [facility] Level of care:  [place] Primary support name:  daughter- [name] Primary support relationship to patient:  FAMILY Degree of support available:   good    CURRENT CONCERNS Current Concerns  Post-Acute Placement   Other Concerns:    SOCIAL WORK ASSESSMENT / PLAN CSW contacted daughter by phone- she reports her mother has been [facility] for 5-6 years. She has been pleased there and anticipates her return at d/c.  CSW also contacted SNF rep who anticipate her return as well.   Assessment/plan status:  Other - See comment Other assessment/ plan:   updated FL2   Information/referral to community resources:    PATIENT'S/FAMILY'S RESPONSE TO PLAN OF CARE: Patient is post op today for hand surgery. Anticipate return to SNF later this week-       Eduard Clos, MSW, [place]

## 2014-03-26 NOTE — Progress Notes (Signed)
PT Cancellation Note  Patient Details Name: [name] MRN: [id number] DOB: 1951/01/29   Cancelled Treatment:    Reason Eval/Treat Not Completed: Patient at procedure or test/unavailable. Pt off unit for test. Will check back as schedule allows to complete PT eval.    Rolinda Roan 03/26/2014, 9:58 AM   Rolinda Roan, PT, DPT Acute Rehabilitation Services Pager: [phone]

## 2014-03-26 NOTE — Progress Notes (Signed)
Family Medicine Teaching Service Daily Progress Note Intern Pager: [phone]  Patient name: [name] Medical record number: [id number] Date of birth: October 25, 1950 Age: 64 y.o. Gender: female  Primary Care Provider: Harriett Sine, MD Consultants: nephrology, ID, vascular surgery Code Status: full code  Pt Overview and Major Events to Date:  1/24 - admit to FPTS for RUE cellulitis vs AVF infection  Assessment and Plan:  [name] is a 64 y.o. female presenting with RUE cellulitis vs AVF infection. PMH is significant for AIDS, ESRD on HD MWF, HTN, pulmonary HTN, depression.  RUE cellulitis: Was seen in ED on 1/22 for same problem. Subcutaneous edema of entire arm, no DVT on UE duplex. Was treated for cellulitis with Vanc and PO Cipro after discussion with nephrology and d/c'd back to SNF. Concern for possible AV fistula infection. Does not meet SIRS/sepsis criteria, but WBC 2.8 (ANC 1.6). Lactic acid normal at 1.8. Site erythematous and edematous with slight purulent drainage from open wound. - IV Vanc/Ceftaz per pharmacy and Clinda - per ID rec.  - Consult to ID, appreciate recommendations  - Monitor on tele - If not improving, consider MRI/CT of UE - f/u BCx (drawn after initiation of antibiotics from 1/21)  - neurovasc checks q4h O/N - Consider Ortho consult if concern for compartment syndrome - neurovasc intact currently - PT/OT/SW for re-placement in SNF - Pain control with home fentanyl patch and Oxy IR - Consult to VVS for possibly infected AVF of R arm. Appreciate recommendations and coordination with nephrology and ID.  - To OR today  AIDS: Last CD4 count 300 (12/22), previously 44. - Consult ID, appreciate recs - Continue home lamivudine, raltegravir, zidovudine - f/u CD4 and HIV RNA  ESRD on HD: Recently missed HD on Friday. Cr 15.01, electrolytes stable.  No acute indication for dialysis. - Consult nephrology, appreciate recs - Will consult vascular  surgery for possible need for new access - appreciate recs - Monitor electrolytes  Pancytopenia: WBC 2.8 > 2.1, Hgb 8.9 >8.4, Platelets 91>78 on admission. Likely related to AIDS vs iatrogenic from medications. - Continue to monitor  Pulmonary HTN: Last Echo (12/2008) with EF 60-65%, PA peak pressure 72. - Continue home sildenafil - Could consider repeat Echo if respiratory status changes  HTN: BP elevated to [id number] systolic on admission. Improving, currently normotensive - Continue home metoprolol, diltiazem - Monitor closely in setting of infection  Depression/Anxiety: Mood stable currently - Continue home Wellbutrin, Cymbalta, Ativan  GERD: No symptoms currently - Continue home PPI (replace with protonix)  Chronic pain: - Continue home fentanyl patch, Oxy IR  FEN/GI: Renal diet, SLIV PPx: SQ heparin  Disposition: Pending vasc/ID/nephro recs and clinical improvement  Subjective:  Continues to have pain in RUE.  Going to OR now for debridement and possible salvaging AVF.  Objective: Temp:  [97.9 F (36.6 C)-98.7 F (37.1 C)] 97.9 F (36.6 C) (01/25 0508) Pulse Rate:  [63-78] 63 (01/25 0508) Resp:  [15-20] 16 (01/25 0508) BP: (130-175)/(57-88) 130/57 mmHg (01/25 0508) SpO2:  [93 %-100 %] 93 % (01/25 0508) Physical Exam: General: chronically ill appearing AAF laying in bed, NAD HEENT: NCAT, poor dentition, dry MM, yellow tinge on tongue Cardiovascular: RRR, no m/r/g. 2+ DP/radial pulses b/l Respiratory: CTAB, no w/r/c. Normal WOB Abdomen: Soft, NDNT, +BS, no HSM or masses, no rebound/guarding Extremities/Skin: No LE edema. 2cm open wound on R forearm with purulent drainage. Erythema and edema over entire R arm. Bruit palpated from AV fistula. Radial pulse +2  R wrist and C5-T1 intact sensation and muscular  Neuro: CN 2-12 grossly intact. Strength intact, but painful to move R hand. Sensation intact.  Laboratory:  Recent Labs Lab 03/23/14 1155 03/25/14 1542  03/25/14 1555 03/26/14 0523  WBC 2.9* 2.8*  --  2.1*  HGB 8.7* 8.9* 10.5* 8.4*  HCT 27.7* 28.4* 31.0* 26.1*  PLT 112* 91*  --  78*    Recent Labs Lab 03/23/14 1155 03/25/14 1542 03/25/14 1555 03/26/14 0523  NA 142 141 141 142  K 3.8 4.3 4.2 4.5  CL 103 98 99 99  CO2 28 26  --  24  BUN 43* 57* 51* 60*  CREATININE 10.74* 15.01* 13.80* 15.62*  CALCIUM 9.0 8.6  --  8.2*  PROT  --  7.2  --  6.4  BILITOT  --  0.7  --  1.0  ALKPHOS  --  92  --  83  ALT  --  9  --  9  AST  --  18  --  13  GLUCOSE 78 99 98 77    Lactic acid - 1.8  Imaging/Diagnostic Tests: US Duplex RUE 03/23/14-  Other Findings: Subcutaneous edema is present involving nearly the entire visualized forearm. No focal fluid collections are identified.  IMPRESSION: No evidence of right upper extremity deep venous thrombosis. Open radiocephalic fistula.  EKG 03/25/14 - LAFB, NSR, QTc prolongation   CXR 03/25/14 - Pulmonary HTN, no overt pulmonary edema   Lavon Paganini, MD 03/26/2014, 7:36 AM PGY-1, [place] Intern pager: [phone], text pages welcome

## 2014-03-26 NOTE — Progress Notes (Signed)
Placed on tele. Alert and orient.

## 2014-03-26 NOTE — Progress Notes (Signed)
Positive bruit & thrill RUE on adm & dc from PACU.

## 2014-03-26 NOTE — Progress Notes (Signed)
UR Completed.  [phone]  

## 2014-03-26 NOTE — Transfer of Care (Signed)
Immediate Anesthesia Transfer of Care Note  Patient: [name]  Procedure(s) Performed: Procedure(s): Right Arm Fistulogram with Venoplasty Right Subclavian Vein and Inominate Vein. Debridement Fistula Ulcer (Right)  Patient Location: PACU  Anesthesia Type:General  Level of Consciousness: sedated  Airway & Oxygen Therapy: Patient Spontanous Breathing and Patient connected to nasal cannula oxygen  Post-op Assessment: Report given to PACU RN, Post -op Vital signs reviewed and stable and Patient moving all extremities  Post vital signs: Reviewed and stable  Complications: No apparent anesthesia complications

## 2014-03-26 NOTE — Anesthesia Preprocedure Evaluation (Signed)
Anesthesia Evaluation  Patient identified by MRN, date of birth, ID band Patient awake    Reviewed: Allergy & Precautions, NPO status , Patient's Chart, lab work & pertinent test results  Airway Mallampati: II  TM Distance: >3 FB Neck ROM: Full    Dental no notable dental hx.    Pulmonary neg pulmonary ROS, former smoker,  breath sounds clear to auscultation  Pulmonary exam normal       Cardiovascular hypertension, Pt. on medications +CHF Rhythm:Regular Rate:Normal     Neuro/Psych negative neurological ROS  negative psych ROS   GI/Hepatic negative GI ROS, Neg liver ROS, GERD-  Medicated,  Endo/Other  negative endocrine ROS  Renal/GU DialysisRenal disease  negative genitourinary   Musculoskeletal negative musculoskeletal ROS (+)   Abdominal   Peds negative pediatric ROS (+)  Hematology  (+) anemia , HIV,   Anesthesia Other Findings   Reproductive/Obstetrics negative OB ROS                             Anesthesia Physical Anesthesia Plan  ASA: III  Anesthesia Plan: General   Post-op Pain Management:    Induction: Intravenous  Airway Management Planned: LMA  Additional Equipment:   Intra-op Plan:   Post-operative Plan: Extubation in OR  Informed Consent: I have reviewed the patients History and Physical, chart, labs and discussed the procedure including the risks, benefits and alternatives for the proposed anesthesia with the patient or authorized representative who has indicated his/her understanding and acceptance.   Dental advisory given  Plan Discussed with: CRNA and Surgeon  Anesthesia Plan Comments:         Anesthesia Quick Evaluation

## 2014-03-27 ENCOUNTER — Encounter (HOSPITAL_COMMUNITY): Payer: Self-pay | Admitting: Vascular Surgery

## 2014-03-27 DIAGNOSIS — T827XXD Infection and inflammatory reaction due to other cardiac and vascular devices, implants and grafts, subsequent encounter: Secondary | ICD-10-CM

## 2014-03-27 LAB — CBC
HEMATOCRIT: 26.8 % — AB (ref 36.0–46.0)
Hemoglobin: 8.4 g/dL — ABNORMAL LOW (ref 12.0–15.0)
MCH: 31.2 pg (ref 26.0–34.0)
MCHC: 31.3 g/dL (ref 30.0–36.0)
MCV: 99.6 fL (ref 78.0–100.0)
Platelets: 100 10*3/uL — ABNORMAL LOW (ref 150–400)
RBC: 2.69 MIL/uL — ABNORMAL LOW (ref 3.87–5.11)
RDW: 14.9 % (ref 11.5–15.5)
WBC: 2.8 10*3/uL — AB (ref 4.0–10.5)

## 2014-03-27 LAB — RENAL FUNCTION PANEL
Albumin: 2.8 g/dL — ABNORMAL LOW (ref 3.5–5.2)
Anion gap: 18 — ABNORMAL HIGH (ref 5–15)
BUN: 71 mg/dL — ABNORMAL HIGH (ref 6–23)
CHLORIDE: 100 mmol/L (ref 96–112)
CO2: 22 mmol/L (ref 19–32)
Calcium: 8.3 mg/dL — ABNORMAL LOW (ref 8.4–10.5)
Creatinine, Ser: 17.87 mg/dL — ABNORMAL HIGH (ref 0.50–1.10)
GFR calc Af Amer: 2 mL/min — ABNORMAL LOW (ref >90)
GFR, EST NON AFRICAN AMERICAN: 2 mL/min — AB (ref >90)
Glucose, Bld: 82 mg/dL (ref 70–99)
POTASSIUM: 5.4 mmol/L — AB (ref 3.5–5.1)
Phosphorus: 12.8 mg/dL — ABNORMAL HIGH (ref 2.3–4.6)
Sodium: 140 mmol/L (ref 135–145)

## 2014-03-27 LAB — VANCOMYCIN, RANDOM: VANCOMYCIN RM: 56.9 ug/mL

## 2014-03-27 LAB — RPR: RPR: NONREACTIVE

## 2014-03-27 LAB — HIV-1 RNA QUANT-NO REFLEX-BLD
HIV 1 RNA Quant: <20 copies/mL (ref <20)
HIV-1 RNA Quant, Log: <1.3 {Log} (ref <1.30)

## 2014-03-27 LAB — GLUCOSE, CAPILLARY: Glucose-Capillary: 82 mg/dL (ref 70–99)

## 2014-03-27 MED ORDER — LIDOCAINE HCL (PF) 1 % IJ SOLN: 5.0000 mL | INTRAMUSCULAR | Status: DC | PRN

## 2014-03-27 MED ORDER — SODIUM CHLORIDE 0.9 % IV SOLN: 100.0000 mL | INTRAVENOUS | Status: DC | PRN

## 2014-03-27 MED ORDER — HEPARIN SODIUM (PORCINE) 1000 UNIT/ML DIALYSIS: 1200.0000 [IU] | INTRAMUSCULAR | Status: DC | PRN

## 2014-03-27 MED ORDER — ALTEPLASE 2 MG IJ SOLR
2.0000 mg | Freq: Once | INTRAMUSCULAR | Status: DC | PRN
  Filled 2014-03-27: qty 2

## 2014-03-27 MED ORDER — HEPARIN SODIUM (PORCINE) 1000 UNIT/ML DIALYSIS: 1000.0000 [IU] | INTRAMUSCULAR | Status: DC | PRN

## 2014-03-27 MED ORDER — LIDOCAINE-PRILOCAINE 2.5-2.5 % EX CREA
1.0000 "application " | TOPICAL_CREAM | CUTANEOUS | Status: DC | PRN
  Filled 2014-03-27: qty 5

## 2014-03-27 MED ORDER — OXYCODONE HCL 5 MG PO TABS
ORAL_TABLET | ORAL | Status: AC
  Filled 2014-03-27: qty 1

## 2014-03-27 MED ORDER — PENTAFLUOROPROP-TETRAFLUOROETH EX AERO
1.0000 | INHALATION_SPRAY | CUTANEOUS | Status: DC | PRN
Start: 2014-03-27 — End: 2014-03-27

## 2014-03-27 MED ORDER — NEPRO/CARBSTEADY PO LIQD
237.0000 mL | ORAL | Status: DC | PRN
  Filled 2014-03-27: qty 237
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
## 2014-03-27 NOTE — Progress Notes (Signed)
[facility] Progress Note   Subjective: pain in R arm post procedure  Filed Vitals:   03/27/14 0924 03/27/14 0949 03/27/14 1003 03/27/14 1026  BP: 143/64 128/69 124/61 125/65  Pulse: 66 66 62 62  Temp:      TempSrc:      Resp:      Weight:      SpO2:       Exam: Alert, no distress No rash, cyanosis or gangrene Sclera anicteric, throat clear +mild JVD Chest clear throughout RRR no MRG Abd soft, NTND, no mass or ascites No LE or UE edema , L arm wrapped post -fistulogram Neuro is NF, Ox 3  HD: MWF [facility] RFA AVF   2/2.5 Bath  3h  350/600   53kg   500u/hr stopped 60 min before end of rx EPO 7000/ hd,  Hect 14 ug TIW   Assessment: 1. RUE edema - now s/p angioplasty of recurrent central venous stenosis, for HD today    2. ESRD on HD - HD today, was a bit difficult to cannulate today 3. HIV on meds 4. 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 Anemia on EPO   Plan - HD today    Kelly Splinter MD  pager [phone]    cell [phone]  03/27/2014, 10:43 AM     Recent Labs Lab 03/25/14 1542 03/25/14 1555 03/26/14 0523 03/27/14 0753  NA 141 141 142 140  K 4.3 4.2 4.5 5.4*  CL 98 99 99 100  CO2 26  --  24 22  GLUCOSE 99 98 77 82  BUN 57* 51* 60* 71*  CREATININE 15.01* 13.80* 15.62* 17.87*  CALCIUM 8.6  --  8.2* 8.3*  PHOS  --   --  11.7* 12.8*    Recent Labs Lab 03/25/14 1542 03/26/14 0523 03/27/14 0753  AST 18 13  --   ALT 9 9  --   ALKPHOS 92 83  --   BILITOT 0.7 1.0  --   PROT 7.2 6.4  --   ALBUMIN 3.1* 2.8* 2.8*    Recent Labs Lab 03/23/14 1155 03/25/14 1542 03/25/14 1555 03/26/14 0523 03/27/14 0752  WBC 2.9* 2.8*  --  2.1* 2.8*  NEUTROABS 1.7 1.6*  --   --   --   HGB 8.7* 8.9* 10.5* 8.4* 8.4*  HCT 27.7* 28.4* 31.0* 26.1* 26.8*  MCV 106.1* 101.4*  --  102.4* 99.6  PLT 112* 91*  --  78* 100*   . buPROPion  150 mg Oral Daily  . cefTAZidime (FORTAZ)  IV  2 g Intravenous Q M,W,F  . clindamycin (CLEOCIN) IV   600 mg Intravenous 3 times per day  . diltiazem  60 mg Oral 3 times per day  . DULoxetine  90 mg Oral Daily  . fentaNYL  50 mcg Transdermal Q72H  . heparin  5,000 Units Subcutaneous 3 times per day  . lamiVUDine  50 mg Oral Daily  . metoprolol  50 mg Oral BID  . multivitamin  1 tablet Oral QHS  . oxyCODONE      . pantoprazole  40 mg Oral Daily  . polyvinyl alcohol  1 drop Both Eyes BID  . raltegravir  400 mg Oral BID  . sevelamer carbonate  800-1,600 mg Oral TID WC  . sildenafil  20 mg Oral TID  . sodium chloride  3 mL Intravenous Q12H  . sodium chloride  3 mL Intravenous Q12H  . [START ON 03/28/2014] vancomycin  500 mg Intravenous Q M,W,F-HD  . vitamin C  500 mg Oral BID  . zidovudine  100 mg Oral TID   . sodium chloride 10 mL/hr at 03/26/14 0937   sodium chloride, sodium chloride, sodium chloride, alteplase, feeding supplement (NEPRO CARB STEADY), heparin, [START ON 03/28/2014] heparin, lidocaine (PF), lidocaine-prilocaine, LORazepam, ondansetron **OR** ondansetron (ZOFRAN) IV, oxyCODONE, pentafluoroprop-tetrafluoroeth, sodium chloride, zolpidem

## 2014-03-27 NOTE — Progress Notes (Addendum)
[facility] for Infectious Disease    Date of Admission:  03/25/2014   Total days of antibiotics 3        Day 3 clindamycin           ID: [name] is a 64 y.o. female with HIV disease, ESRD presetns with AV fistula infection  Active Problems:   Arm swelling   AV fistula infection   HIV (human immunodeficiency virus infection)   Essential hypertension    Subjective: Afebrile, but still having pain associated with fistula site of right arm.   She is pod #1 1. right radiocephalic arteriovenous fistula cannulation under ultrasound guidance 2. Venoplasty of subclavian/innominate vein x 4 (4 mm x 80 mm, 6 mm x 100 mm, 8 mm x 100 mm, 10 mm x 40 mm) 4. Venoplasty of superior vena cava (4 mm x 80 mm, 6 mm x 100 mm, 8 mm x 100 mm, 10 mm x 40 mm) 5. Debridement of right radiocephalic arteriovenous fistula ulcer  Medications:  . buPROPion  150 mg Oral Daily  . cefTAZidime (FORTAZ)  IV  2 g Intravenous Q M,W,F  . clindamycin (CLEOCIN) IV  600 mg Intravenous 3 times per day  . diltiazem  60 mg Oral 3 times per day  . DULoxetine  90 mg Oral Daily  . fentaNYL  50 mcg Transdermal Q72H  . heparin  5,000 Units Subcutaneous 3 times per day  . lamiVUDine  50 mg Oral Daily  . metoprolol  50 mg Oral BID  . multivitamin  1 tablet Oral QHS  . pantoprazole  40 mg Oral Daily  . polyvinyl alcohol  1 drop Both Eyes BID  . raltegravir  400 mg Oral BID  . sevelamer carbonate  800-1,600 mg Oral TID WC  . sildenafil  20 mg Oral TID  . sodium chloride  3 mL Intravenous Q12H  . sodium chloride  3 mL Intravenous Q12H  . [START ON 03/28/2014] vancomycin  500 mg Intravenous Q M,W,F-HD  . vitamin C  500 mg Oral BID  . zidovudine  100 mg Oral TID    Objective: Vital signs in last 24 hours: Temp:  [98 F (36.7 C)-98.9 F (37.2 C)] 98.9 F (37.2 C) (01/26 1111) Pulse Rate:  [59-71] 68 (01/26 1433) Resp:  [16-20] 16 (01/26 1109) BP: (110-149)/(53-74) 149/68 mmHg (01/26 1433) SpO2:   [95 %-99 %] 96 % (01/26 1433) Weight:  [120 lb 5.9 oz (54.6 kg)-125 lb 10.6 oz (57 kg)] 120 lb 5.9 oz (54.6 kg) (01/26 1109) Physical Exam  Constitutional:  oriented to person, place, and time. appears well-developed and well-nourished. Appears in mild distress from right arm pain HENT:  Mouth/Throat: Oropharynx is clear and moist. No oropharyngeal exudate.  Cardiovascular: Normal rate, regular rhythm and normal heart sounds. Exam reveals no gallop and no friction rub.  No murmur heard.  Pulmonary/Chest: Effort normal and breath sounds normal. No respiratory distress.  has no wheezes.  Skin: Skin is warm to touch. No erythema. Right arm wrapped at fistula site Ext: right arm is edematous mostly seen in arm/had, distal to graft   Lab Results  Recent Labs  03/26/14 0523 03/27/14 0752 03/27/14 0753  WBC 2.1* 2.8*  --   HGB 8.4* 8.4*  --   HCT 26.1* 26.8*  --   NA 142  --  140  K 4.5  --  5.4*  CL 99  --  100  CO2 24  --  22  BUN 60*  --  71*  CREATININE 15.62*  --  17.87*   Liver Panel  Recent Labs  03/25/14 1542 03/26/14 0523 03/27/14 0753  PROT 7.2 6.4  --   ALBUMIN 3.1* 2.8* 2.8*  AST 18 13  --   ALT 9 9  --   ALKPHOS 92 83  --   BILITOT 0.7 1.0  --     Microbiology: 1/24 blood cx ngtd x 48 hr   Assessment/Plan: Presumed right av fistula infection = currently on ceftaz plus vancomycin. Will discontinue clindamycin and do vancomycin with ceftaz. Await cx to finalize at 72 hr. If still negative, would just treat with vancomycin after HD x 2 wk.  hiv disease= continue on raltegravir bid, plus lamivudine plus zidovudine. Cd 4 count < 200. Will need bactrim ds TID  ESRD = continue on renally dosed antivirals and antibiotics  Ercole Georg, [facility] Infectious Diseases Cell: [phone] Pager: [phone]  03/27/2014, 5:11 PM

## 2014-03-27 NOTE — Procedures (Signed)
I was present at this dialysis session, have reviewed the session itself and made  appropriate changes  Kelly Splinter MD (pgr) [phone]    ([phone] 03/27/2014, 10:43 AM

## 2014-03-27 NOTE — Progress Notes (Signed)
Family Medicine Teaching Service Daily Progress Note Intern Pager: [phone]  Patient name: [name] Medical record number: [id number] Date of birth: [birth year] Age: 64 y.o. Gender: female  Primary Care Provider: Harriett Sine, MD Consultants: nephrology, ID, vascular surgery Code Status: full code  Pt Overview and Major Events to Date:  1/24 - admit to FPTS for RUE cellulitis vs AVF infection  Assessment and Plan:  [name] is a 64 y.o. female presenting with RUE cellulitis vs AVF infection. PMH is significant for AIDS, ESRD on HD MWF, HTN, pulmonary HTN, depression.  RUE cellulitis: Was seen in ED on 1/22 for same problem. Subcutaneous edema of entire arm, no DVT on UE duplex. Was treated for cellulitis with Vanc and PO Cipro after discussion with nephrology and d/c'd back to SNF. Does not meet SIRS/sepsis criteria, but WBC 2.8 (ANC 1.6). Lactic acid normal at 1.8. Site erythematous and edematous with slight purulent drainage from open wound. - IV Vanc/Ceftaz per pharmacy and Clinda - per ID rec. - appreciate recs about d/c'ing abx in light of no evidence of infection of AVF - Monitor on tele - If not improving, consider MRI/CT of UE - f/u BCx (drawn after initiation of antibiotics from 1/22) - NGTD - neurovasc intact currently - Pain control with home fentanyl patch and Oxy IR - Consult to VVS - s/p OR 1/25 which showed no infection of AVF  AIDS: Last CD4 count 300 (12/22), previously 80. CD4 count 140 on admission. - Consult ID, appreciate recs - Continue home lamivudine, raltegravir, zidovudine - f/u HIV RNA  ESRD on HD: Recently missed HD on Friday. Cr 15.01, electrolytes stable.  HD this AM. - Consult nephrology, appreciate recs - Monitor electrolytes  Pancytopenia: Stable. WBC 2.8 > 2.1, Hgb 8.9 >8.4, Platelets 91>78 on admission. Likely related to AIDS vs iatrogenic from medications. - Continue to monitor  Pulmonary HTN: Last Echo (12/2008) with EF  60-65%, PA peak pressure 72. - Continue home sildenafil - Could consider repeat Echo if respiratory status changes  HTN: BP elevated to [id number] systolic on admission. Improving, currently normotensive - Continue home metoprolol, diltiazem  Depression/Anxiety: Mood stable currently - Continue home Wellbutrin, Cymbalta, Ativan  GERD: No symptoms currently - Continue home PPI (replace with protonix)  Chronic pain: - Continue home fentanyl patch, Oxy IR  FEN/GI: Renal diet, SLIV PPx: SQ heparin  Disposition: Pending vasc/ID/nephro recs and clinical improvement. Possibly later today  Subjective:  Continues to have pain in RUE.  Worse with HD  Objective: Temp:  [97.2 F (36.2 C)-98.4 F (36.9 C)] 98 F (36.7 C) (01/26 0648) Pulse Rate:  [57-70] 65 (01/26 0830) Resp:  [10-20] 20 (01/26 0800) BP: (110-155)/(53-83) 127/67 mmHg (01/26 0830) SpO2:  [97 %-100 %] 97 % (01/26 0539) Weight:  [125 lb 10.6 oz (57 kg)] 125 lb 10.6 oz (57 kg) (01/26 [id number]) Physical Exam: General: chronically ill appearing AAF laying in bed in HD, NAD HEENT: NCAT, MMM, no dentition Cardiovascular: RRR, no m/r/g. 2+ DP/radial pulses b/l Respiratory: CTAB, no w/r/c. Normal WOB Abdomen: Soft, NDNT, +BS, no HSM or masses, no rebound/guarding Extremities/Skin: No LE edema. Bandage in place, c/d. Erythema and edema over entire R arm. Radial pulse +2 R wrist and C5-T1 intact sensation and muscular   Laboratory:  Recent Labs Lab 03/23/14 1155 03/25/14 1542 03/25/14 1555 03/26/14 0523  WBC 2.9* 2.8*  --  2.1*  HGB 8.7* 8.9* 10.5* 8.4*  HCT 27.7* 28.4* 31.0* 26.1*  PLT 112* 91*  --  78*    Recent Labs Lab 03/23/14 1155 03/25/14 1542 03/25/14 1555 03/26/14 0523  NA 142 141 141 142  K 3.8 4.3 4.2 4.5  CL 103 98 99 99  CO2 28 26  --  24  BUN 43* 57* 51* 60*  CREATININE 10.74* 15.01* 13.80* 15.62*  CALCIUM 9.0 8.6  --  8.2*  PROT  --  7.2  --  6.4  BILITOT  --  0.7  --  1.0  ALKPHOS  --  92  --  83   ALT  --  9  --  9  AST  --  18  --  13  GLUCOSE 78 99 98 77    Lactic acid - 1.8  Imaging/Diagnostic Tests: US Duplex RUE 03/23/14-  Other Findings: Subcutaneous edema is present involving nearly the entire visualized forearm. No focal fluid collections are identified.  IMPRESSION: No evidence of right upper extremity deep venous thrombosis. Open radiocephalic fistula.  EKG 03/25/14 - LAFB, NSR, QTc prolongation   CXR 03/25/14 - Pulmonary HTN, no overt pulmonary edema   Lavon Paganini, MD 03/27/2014, 8:52 AM PGY-1, [place] Intern pager: [phone], text pages welcome

## 2014-03-27 NOTE — Progress Notes (Signed)
ANTIBIOTIC CONSULT NOTE - FOLLOW UP  Pharmacy Consult for Vancomycin Indication: R-AVF infection  Allergies  Allergen Reactions  . Penicillins     REACTION: rash  . Latex Rash    Patient Measurements: Weight: 120 lb 5.9 oz (54.6 kg) Adjusted Body Weight:   Vital Signs: Temp: 98.9 F (37.2 C) (01/26 1111) Temp Source: Oral (01/26 1111) BP: 127/65 mmHg (01/26 1111) Pulse Rate: 71 (01/26 1158) Intake/Output from previous day: 01/25 0701 - 01/26 0700 In: 340 [P.O.:240; I.V.:100] Out: -  Intake/Output from this shift: Total I/O In: -  Out: 2500 [Other:2500]  Labs:  Recent Labs  03/25/14 1542 03/25/14 1555 03/26/14 0523 03/27/14 0752 03/27/14 0753  WBC 2.8*  --  2.1* 2.8*  --   HGB 8.9* 10.5* 8.4* 8.4*  --   PLT 91*  --  78* 100*  --   CREATININE 15.01* 13.80* 15.62*  --  17.87*   Estimated Creatinine Clearance: 2.6 mL/min (by C-G formula based on Cr of 17.87).  Recent Labs  03/27/14 1231  VANCORANDOM 56.9     Microbiology: Recent Results (from the past 720 hour(s))  Blood culture (routine x 2)     Status: None (Preliminary result)   Collection Time: 03/25/14  3:37 PM  Result Value Ref Range Status   Specimen Description BLOOD LEFT HAND  Final   Special Requests BOTTLES DRAWN AEROBIC ONLY 5CC  Final   Culture   Final           BLOOD CULTURE RECEIVED NO GROWTH TO DATE CULTURE WILL BE HELD FOR 5 DAYS BEFORE ISSUING A FINAL NEGATIVE REPORT Performed at [facility]    Report Status PENDING  Incomplete  Blood culture (routine x 2)     Status: None (Preliminary result)   Collection Time: 03/25/14  3:45 PM  Result Value Ref Range Status   Specimen Description BLOOD LEFT ANTECUBITAL  Final   Special Requests BOTTLES DRAWN AEROBIC AND ANAEROBIC 5CC  Final   Culture   Final           BLOOD CULTURE RECEIVED NO GROWTH TO DATE CULTURE WILL BE HELD FOR 5 DAYS BEFORE ISSUING A FINAL NEGATIVE REPORT Performed at [facility]    Report Status  PENDING  Incomplete  Surgical pcr screen     Status: None   Collection Time: 03/26/14  9:16 AM  Result Value Ref Range Status   MRSA, PCR NEGATIVE NEGATIVE Final   Staphylococcus aureus NEGATIVE NEGATIVE Final    Comment:        The Xpert SA Assay (FDA approved for NASAL specimens in patients over 76 years of age), is one component of a comprehensive surveillance program.  Test performance has been validated by [facility] for patients greater than or equal to 58 year old. It is not intended to diagnose infection nor to guide or monitor treatment.     Anti-infectives    Start     Dose/Rate Route Frequency Ordered Stop   03/28/14 1200  vancomycin (VANCOCIN) 500 mg in sodium chloride 0.9 % 100 mL IVPB     500 mg100 mL/hr over 60 Minutes Intravenous Every M-W-F (Hemodialysis) 03/26/14 1352     03/26/14 1200  vancomycin (VANCOCIN) 500 mg in sodium chloride 0.9 % 100 mL IVPB  Status:  Discontinued     500 mg100 mL/hr over 60 Minutes Intravenous Every M-W-F (Hemodialysis) 03/25/14 1648 03/26/14 1352   03/26/14 1000  vancomycin (VANCOCIN) IVPB 1000 mg/200 mL premix  Status:  Discontinued     1,000 mg200 mL/hr over 60 Minutes Intravenous On call to O.R. 03/25/14 1951 03/26/14 0851   03/26/14 1000  lamiVUDine (EPIVIR) 10 MG/ML solution 50 mg     50 mg Oral Daily 03/25/14 2132     03/26/14 0958  vancomycin (VANCOCIN) 1 GM/200ML IVPB    Comments:  Claybon Jabs   : cabinet override      03/26/14 0958 03/26/14 1010   03/26/14 0000  vancomycin (VANCOCIN) IVPB 1000 mg/200 mL premix  Status:  Discontinued     1,000 mg200 mL/hr over 60 Minutes Intravenous To Surgery 03/25/14 1932 03/25/14 1951   03/25/14 2200  raltegravir (ISENTRESS) tablet 400 mg     400 mg Oral 2 times daily 03/25/14 2124     03/25/14 2200  zidovudine (RETROVIR) capsule 100 mg     100 mg Oral 3 times daily 03/25/14 2124     03/25/14 2200  clindamycin (CLEOCIN) IVPB 600 mg     600 mg100 mL/hr over 30 Minutes Intravenous 3  times per day 03/25/14 2001     03/25/14 2130  lamivudine (EPIVIR) tablet 50 mg  Status:  Discontinued     50 mg Oral Daily 03/25/14 2124 03/25/14 2131   03/25/14 2000  vancomycin (VANCOCIN) 1,250 mg in sodium chloride 0.9 % 250 mL IVPB     1,250 mg166.7 mL/hr over 90 Minutes Intravenous  Once 03/25/14 1646 03/26/14 0345   03/25/14 1845  cefTAZidime (FORTAZ) 2 g in dextrose 5 % 50 mL IVPB     2 g100 mL/hr over 30 Minutes Intravenous Every M-W-F 03/25/14 1807     03/25/14 1800  piperacillin-tazobactam (ZOSYN) IVPB 2.25 g  Status:  Discontinued     2.25 g100 mL/hr over 30 Minutes Intravenous 3 times per day 03/25/14 1646 03/25/14 1804      Assessment: 64yo female with R-AVF infection, given extra dose of Vanc in OR yesterday and standing doses are currently on hold.  Pt received HD today from 7-11AM, requiring BFR reduction to 250.  Random Vancomycin after HD is 56.9.   Pt has been AFebrile and blood cultures are ntd.  WBC is low at 2.8  Goal of Therapy:  pre-HD Vanc 15-25  Plan:  Continue to hold Vancomycin Repeat post HD level on Thursday or Saturday, depending on HD tolerance  Gracy Bruins, PharmD Clinical Pharmacist [facility]

## 2014-03-27 NOTE — Progress Notes (Addendum)
CRITICAL VALUE ALERT  Critical value received:Phos 12.8  Date of notification:  03/27/14  Time of notification:  0930  Critical value read back:yes   Nurse who received alert:  Louretta Shorten  MD notified (1st page):  [place]  Time of first page:  0930  MD notified (2nd page):  Time of second page:  Responding MD:  Jonnie Finner  Time MD responded: 0930  No orders recieved

## 2014-03-27 NOTE — Progress Notes (Signed)
Hemodialysis- Difficulty cannulating avf (venous side) d/t severe swelling post revision yesterday. Attempted x3 with 16g. Successful with 17g. Pt tearful and complaining of pain, medicated. No further complaints. Continue to monitor.

## 2014-03-27 NOTE — Clinical Documentation Improvement (Signed)
It is noted in the impression of the H/P pt has h/o chronic CHF and a recent Chest Xray 03/25/14 reveals pt with cardiomegaly with both pulmonary and arterial HTN. Can the chronic CHF be further specified as:   Possible Clinical Conditions?  Chronic Systolic Congestive Heart Failure Chronic Diastolic Congestive Heart Failure Chronic Systolic & Diastolic Congestive Heart Failure Acute Systolic Congestive Heart Failure Acute Diastolic Congestive Heart Failure Acute Systolic & Diastolic Congestive Heart Failure Acute on Chronic Systolic Congestive Heart Failure Acute on Chronic Diastolic Congestive Heart Failure Acute on Chronic Systolic & Diastolic Congestive Heart Failure Other Condition________________________________________ Cannot Clinically Determine  Supporting Information:  Risk Factors: Infected AV fistula, HIV, pancytopenia, Cellulitis, hypocalcemia, pulmonary HTN  Signs & Symptoms: Diagnostics: Chest X ray 03/25/14 IMPRESSION: 1. Cardiomegaly with both pulmonary venous and pulmonary arterial hypertension. No overt pulmonary edema.  Treatment: metoprolol (LOPRESSOR) tablet 50 mg  diltiazem (CARDIZEM) tablet 60 mg   Thank You, Heloise Beecham ,RN Clinical Documentation Specialist:  [phone]  [facility] Information Management

## 2014-03-27 NOTE — Clinical Documentation Improvement (Signed)
The impression of the Op Note 03/26/14 states pt with "Debridement of right radiocephalic &arteriovenous fistula ulcer."  Clarification Needed  Please clarify documentation in the medical record of "debridement."  Please document the below (4) key elements in a progress note: ? Type of instrument used? ? Depth of debridement/type of tissue removed? (e.g., skin, subcutaneous tissue, fascia, muscle, bone, other) ? Did the debridement extend outside or beyond the wound margins? ? Excisional vs. nonexcisional? ? Document the size of the debridement.  Risk Factors: Infected AV fistula, HIV, pancytopenia, Cellulitis, hypocalcemia, pulmonary HTN Sign & Symptoms: Debridement Fistula Ulcer (Right) per Anesthesia Post/Op note 03/26/14   Diagnostics: Treatment See procedure noted above. Xeroform over this exposed subcutaneous tissue and applied a pressure dressing over this segment of the fistula per Op note 03/26/14  Thank You,  Heloise Beecham ,RN Clinical Documentation Specialist:  [place] Information Management

## 2014-03-28 MED ORDER — SULFAMETHOXAZOLE-TRIMETHOPRIM 400-80 MG PO TABS
1.0000 | ORAL_TABLET | ORAL | Status: DC
  Administered 2014-03-28: 1 via ORAL
  Filled 2014-03-28: qty 1

## 2014-03-28 MED ORDER — LAMIVUDINE 10 MG/ML PO SOLN
25.0000 mg | Freq: Every day | ORAL | Status: DC
  Administered 2014-03-29: 25 mg via ORAL
  Filled 2014-03-28: qty 5

## 2014-03-28 NOTE — Progress Notes (Signed)
Physical Therapy Treatment Patient Details Name: [name] MRN: [id number] DOB: [birth year] Today's Date: 03/28/2014    History of Present Illness Pt is a 64 yo F with known HIV (not AIDS CD4 count 300 last measurement), ESRD on HD, HTN, pulmonary HTN, depression who presents with several days progressing RUE edema. Seen in ED and treated for cellulitis. This is same arm as her AV fistula. She did not present for HD on this past Friday as she was snowed in. Returned to ED due to pain and swelling. Admitted and seen already by ID, renal, vascular. Thought is central venous stenosis versus occlusion, also with erosion over her fistula. Pt is now s/p AVF placement on 03/26/14.     PT Comments    Pt progressing towards physical therapy goals. Did not require as much assistance today for transfer to recliner chair. Pt continues to complain of pain and swelling in RUE, and was guarding that arm during all mobility. Will continue to follow and progress as able per POC.   Follow Up Recommendations  SNF;Supervision/Assistance - 24 hour     Equipment Recommendations  None recommended by PT    Recommendations for Other Services       Precautions / Restrictions Precautions Precautions: Fall Precaution Comments: AVF angioplasty 03/26/14 Restrictions Weight Bearing Restrictions: No    Mobility  Bed Mobility Overal bed mobility: Needs Assistance Bed Mobility: Supine to Sit     Supine to sit: Min assist     General bed mobility comments: Assist to transition to EOB. Pt required specific cues for hand placement on rails and movement of LE's to achieve transfer. Pt guarding RUE.   Transfers Overall transfer level: Needs assistance Equipment used: 1 person hand held assist Transfers: Sit to/from [facility] Sit to Stand: Min assist Stand pivot transfers: Min assist       General transfer comment: Pt required assist to power-up to full standing and to maintain  balance for SPT to chair. Face-to-face transfer utilized with gait belt.   Ambulation/Gait             General Gait Details: Pt ambulates minimally at baseline.    Stairs            Wheelchair Mobility    Modified Rankin (Stroke Patients Only)       Balance Overall balance assessment: Needs assistance Sitting-balance support: Feet supported;No upper extremity supported Sitting balance-Leahy Scale: Fair     Standing balance support: Bilateral upper extremity supported;During functional activity Standing balance-Leahy Scale: Poor Standing balance comment: Pt requires UE support to maintain balance.                     Cognition Arousal/Alertness: Awake/alert Behavior During Therapy: WFL for tasks assessed/performed Overall Cognitive Status: Within Functional Limits for tasks assessed                      Exercises General Exercises - Lower Extremity Ankle Circles/Pumps: 10 reps Quad Sets: 10 reps Long Arc Quad: 10 reps Hip ABduction/ADduction: 10 reps    General Comments        Pertinent Vitals/Pain Pain Assessment: Faces Faces Pain Scale: Hurts little more Pain Location: RUE with movement/touch Pain Intervention(s): Limited activity within patient's tolerance;Premedicated before session;Repositioned    Home Living                      Prior Function  PT Goals (current goals can now be found in the care plan section) Acute Rehab PT Goals Patient Stated Goal: Use her R arm again - decrease swelling PT Goal Formulation: With patient Time For Goal Achievement: 04/09/14 Potential to Achieve Goals: Good Progress towards PT goals: Progressing toward goals    Frequency  Min 2X/week    PT Plan Current plan remains appropriate    Co-evaluation             End of Session Equipment Utilized During Treatment: Gait belt         Time: [id number] PT Time Calculation (min) (ACUTE ONLY): 27 min  Charges:   $Therapeutic Exercise: 8-22 mins $Therapeutic Activity: 8-22 mins                    G Codes:      Rolinda Roan 04/26/2014, 10:17 AM   Rolinda Roan, PT, DPT Acute Rehabilitation Services Pager: [phone]

## 2014-03-28 NOTE — Progress Notes (Addendum)
Occupational Therapy Treatment Patient Details Name: [name] MRN: [id number] DOB: December 22, 1950 Today's Date: 03/28/2014    History of present illness Pt is a 64 yo F with known HIV (not AIDS CD4 count 300 last measurement), ESRD on HD, HTN, pulmonary HTN, depression who presents with several days progressing RUE edema. Seen in ED and treated for cellulitis. This is same arm as her AV fistula. She did not present for HD on this past Friday as she was snowed in. Returned to ED due to pain and swelling. Admitted and seen already by ID, renal, vascular. Thought is central venous stenosis versus occlusion, also with erosion over her fistula. Pt is now s/p AVF placement on 03/26/14.    OT comments  Performed Rt UE exercises, retrograde massage, and elevated Rt UE (explained purpose of these things).   Follow Up Recommendations  SNF;Supervision/Assistance - 24 hour    Equipment Recommendations  None recommended by OT    Recommendations for Other Services      Precautions / Restrictions Precautions Precautions: Fall Precaution Comments: AVF angioplasty 03/26/14; per PA, squeeze ball or theraputty okay with Rt hand, but otherwise no resistance Restrictions Weight Bearing Restrictions: No       Mobility Bed Mobility Overal bed mobility: Modified Independent        Transfers Overall transfer level: Needs assistance Transfers: Sit to/from Stand;Stand Pivot Transfers Sit to Stand: Min guard Stand pivot transfers: Min guard               ADL Overall ADL's : Needs assistance/impaired                         Toilet Transfer: Min guard;Stand-pivot (from chair to bed)           Functional mobility during ADLs: Min guard (stand pivot) General ADL Comments: Discussed how pt could use foam to put on toothbrush.       Vision                     Perception     Praxis      Cognition  Awake/Alert Behavior During Therapy: WFL for tasks  assessed/performed Overall Cognitive Status: Within Functional Limits for tasks assessed                       Extremity/Trunk Assessment               Exercises  Other Exercises Other Exercises: Performed retrograde massage to Rt hand and showed pt Other Exercises: Performed A/AAROM Rt shoulder flexion/extension, wrist flexion/extension, elbow flexion/extension, digit composite flexion/extension-approximately 20 reps of each and explained to do this to help with edema Other Exercises: squeeze ball exercise with Rt hand-PT talked with PA and this is okay according to PA   Shoulder Instructions       General Comments      Pertinent Vitals/ Pain       Pain Assessment: 0-10 Pain Score: 6  Pain Location: Rt UE Pain Descriptors / Indicators:  (stinging) Pain Intervention(s): Monitored during session;Repositioned  Home Living                                          Prior Functioning/Environment              Frequency Min 2X/week  Progress Toward Goals  OT Goals(current goals can now be found in the care plan section)  Progress towards OT goals: Progressing toward goals  Acute Rehab OT Goals Patient Stated Goal: not stated OT Goal Formulation: With patient Time For Goal Achievement: 04/03/14 Potential to Achieve Goals: Good ADL Goals Pt Will Perform Eating: with set-up;sitting;bed level;with adaptive utensils (with Rt UE) Pt Will Perform Grooming: with set-up;sitting Pt/caregiver will Perform Home Exercise Program: Right Upper extremity (AROM 10 reps 3x/day) Additional ADL Goal #1: Pt will be independent in edema management of R UE.  Plan Discharge plan remains appropriate    Co-evaluation                 End of Session Equipment Utilized During Treatment: Other (comment) (squeeze ball)   Activity Tolerance Patient tolerated treatment well   Patient Left in bed;with call bell/phone within reach   Nurse Communication           Time: [id number] OT Time Calculation (min): 18 min  Charges: OT General Charges $OT Visit: 1 Procedure OT Treatments $Therapeutic Exercise: 8-22 mins  Benito Mccreedy OTR/L [id number] 03/28/2014, 11:59 AM

## 2014-03-28 NOTE — Clinical Social Work Note (Signed)
CSW continuing to monitor patient's progress and readiness for d/c back to [facility]. Once medically stable CSW will facilitate discharge back to nursing facility.  [name], MSW, LCSW Licensed Clinical Social Worker [place] [phone]

## 2014-03-28 NOTE — Progress Notes (Signed)
Subjective:  Tolerated using R upper arm avf yesterday / Pain improving also  Objective Vital signs in last 24 hours: Filed Vitals:   03/27/14 1835 03/27/14 2100 03/27/14 2225 03/28/14 0500  BP: 135/61 130/86  138/59  Pulse: 72 65  58  Temp: 98.1 F (36.7 C) 98 F (36.7 C)  98 F (36.7 C)  TempSrc: Oral Oral  Oral  Resp: 18 18  18   Height:   5\' 2"  (1.575 m)   Weight:  54.6 kg (120 lb 5.9 oz)    SpO2: 98% 98%  98%   Weight change: -2.4 kg (-5 lb 4.7 oz)  Physical Exam: General: alert nad  Heart: RRR Lungs: CTA  Abdomen: soft, nt, nd Extremities:No pedal edema, Mild swelling R arm  Dialysis Access: pos bruit R FA AVGG with dressing intact dry/clean   HD: MWF [facility] RFA AVF 2/2.5 Bath 3h 350/600 53kg 500u/hr stopped 60 min before end of rx EPO 7000/ hd, Hect 14 ug TIW  Problem/Plan: 1. 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 ESRD on HD MWF- [facility] /HD yesterday with a bit Of difficulty cannulating /  3. HIV on meds 4. 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 Anemia on EPO  Ernest Haber, PA-C [place] [phone] 03/28/2014,9:17 AM  LOS: 3 days   Pt seen, examined and agree w A/P as above. Plan HD first shift in am and home after that.   Kelly Splinter MD pager [phone]    cell [phone] 03/28/2014, 11:53 AM    Labs: Basic Metabolic Panel:  Recent Labs Lab 03/25/14 1542 03/25/14 1555 03/26/14 0523 03/27/14 0753  NA 141 141 142 140  K 4.3 4.2 4.5 5.4*  CL 98 99 99 100  CO2 26  --  24 22  GLUCOSE 99 98 77 82  BUN 57* 51* 60* 71*  CREATININE 15.01* 13.80* 15.62* 17.87*  CALCIUM 8.6  --  8.2* 8.3*  PHOS  --   --  11.7* 12.8*   Liver Function Tests:  Recent Labs Lab 03/25/14 1542 03/26/14 0523 03/27/14 0753  AST 18 13  --   ALT 9 9  --   ALKPHOS 92 83  --   BILITOT 0.7 1.0  --   PROT 7.2 6.4  --   ALBUMIN 3.1* 2.8* 2.8*  CBC:  Recent Labs Lab  03/23/14 1155 03/25/14 1542 03/25/14 1555 03/26/14 0523 03/27/14 0752  WBC 2.9* 2.8*  --  2.1* 2.8*  NEUTROABS 1.7 1.6*  --   --   --   HGB 8.7* 8.9* 10.5* 8.4* 8.4*  HCT 27.7* 28.4* 31.0* 26.1* 26.8*  MCV 106.1* 101.4*  --  102.4* 99.6  PLT 112* 91*  --  78* 100*   Cardiac Enzymes: No results for input(s): CKTOTAL, CKMB, CKMBINDEX, TROPONINI in the last 168 hours. CBG:  Recent Labs Lab 03/26/14 1154 03/27/14 1138  GLUCAP 80 82    Studies/Results: No results found. Medications: . sodium chloride 10 mL/hr at 03/26/14 0937   . buPROPion  150 mg Oral Daily  . cefTAZidime (FORTAZ)  IV  2 g Intravenous Q M,W,F  . diltiazem  60 mg Oral 3 times per day  . DULoxetine  90 mg Oral Daily  . fentaNYL  50 mcg Transdermal Q72H  . heparin  5,000 Units Subcutaneous 3 times per day  . lamiVUDine  50 mg Oral Daily  . metoprolol  50 mg Oral BID  .  multivitamin  1 tablet Oral QHS  . pantoprazole  40 mg Oral Daily  . polyvinyl alcohol  1 drop Both Eyes BID  . raltegravir  400 mg Oral BID  . sevelamer carbonate  800-1,600 mg Oral TID WC  . sildenafil  20 mg Oral TID  . sodium chloride  3 mL Intravenous Q12H  . sodium chloride  3 mL Intravenous Q12H  . vancomycin  500 mg Intravenous Q M,W,F-HD  . vitamin C  500 mg Oral BID  . zidovudine  100 mg Oral TID

## 2014-03-28 NOTE — Progress Notes (Signed)
Family Medicine Teaching Service Daily Progress Note Intern Pager: [phone]  Patient name: [name] Medical record number: [id number] Date of birth: Dec 29, 1950 Age: 64 y.o. Gender: female  Primary Care Provider: Harriett Sine, MD Consultants: nephrology, ID, vascular surgery Code Status: full code  Pt Overview and Major Events to Date:  1/24 - admit to FPTS for RUE cellulitis vs AVF infection  Assessment and Plan:  [name] is a 64 y.o. female presenting with RUE cellulitis vs AVF infection. PMH is significant for AIDS, ESRD on HD MWF, HTN, pulmonary HTN, depression.  RUE cellulitis: Was seen in ED on 1/22 for same problem. Subcutaneous edema of entire arm, no DVT on UE duplex. Was treated for cellulitis with Vanc and PO Cipro after discussion with nephrology and d/c'd back to SNF. Does not meet SIRS/sepsis criteria, but WBC 2.8 (ANC 1.6). Lactic acid normal at 1.8. Site erythematous and edematous with slight purulent drainage from open wound. - IV Vanc/Ceftaz per pharmacy - per ID rec. - appreciate recs - will continue until Cxs neg x72hrs (late this evening) - Clinda discontinued 1/26 - Monitor on tele - f/u BCx (drawn after initiation of antibiotics from 1/22) - NGTD - neurovasc intact currently - Pain control with home fentanyl patch and Oxy IR - Consult to [facility] - s/p OR 1/25 which showed no infection of AVF  AIDS: Last CD4 count 300 (12/22), previously 80. CD4 count 140 on admission. - Consult ID, appreciate recs - Continue home lamivudine, raltegravir, zidovudine - f/u HIV RNA - Start bactrim Ds TID on d/c  ESRD on HD: Recently missed HD on Friday. Cr 17.87, K 5.4.  HD this AM. - Consult nephrology, appreciate recs - Monitor electrolytes  Pancytopenia: Stable. WBC 2.8 , Hgb 8.9, Platelets 91 on admission. Likely related to AIDS vs iatrogenic from medications. - Continue to monitor  Pulmonary HTN: Last Echo (12/2008) with EF 60-65%, PA peak pressure  72. - Continue home sildenafil - Could consider repeat Echo if respiratory status changes  HTN: BP elevated to [id number] systolic on admission. Improving, currently normotensive - Continue home metoprolol, diltiazem  Depression/Anxiety: Mood stable currently - Continue home Wellbutrin, Cymbalta, Ativan  GERD: No symptoms currently - Continue home PPI (replace with protonix)  Chronic pain: - Continue home fentanyl patch, Oxy IR  FEN/GI: Renal diet, SLIV PPx: SQ heparin  Disposition: Pending vasc/ID/nephro recs and clinical improvement. Cultures should be neg for 72 hours. Likely back to SNF tomorrow.  Subjective:  Pain somewhat improved. Feeling overall well  Objective: Temp:  [98 F (36.7 C)-98.9 F (37.2 C)] 98 F (36.7 C) (01/27 0500) Pulse Rate:  [58-72] 58 (01/27 0500) Resp:  [16-18] 18 (01/27 0500) BP: (124-149)/(59-86) 138/59 mmHg (01/27 0500) SpO2:  [95 %-98 %] 98 % (01/27 0500) Weight:  [120 lb 5.9 oz (54.6 kg)] 120 lb 5.9 oz (54.6 kg) (01/26 2100) Physical Exam: General: chronically ill appearing AAF sitting in bedside chair, NAD HEENT: NCAT, MMM, no dentition Cardiovascular: RRR, no m/r/g. 2+ DP/radial pulses b/l Respiratory: CTAB, no w/r/c. Normal WOB Abdomen: Soft, NDNT, +BS, no HSM or masses, no rebound/guarding Extremities/Skin: No LE edema. Bandage in place, c/d. Erythema and edema over entire R arm. Radial pulse +2 R wrist and C5-T1 intact sensation and muscular   Laboratory:  Recent Labs Lab 03/25/14 1542 03/25/14 1555 03/26/14 0523 03/27/14 0752  WBC 2.8*  --  2.1* 2.8*  HGB 8.9* 10.5* 8.4* 8.4*  HCT 28.4* 31.0* 26.1* 26.8*  PLT 91*  --  78* 100*    Recent Labs Lab 03/25/14 1542 03/25/14 1555 03/26/14 0523 03/27/14 0753  NA 141 141 142 140  K 4.3 4.2 4.5 5.4*  CL 98 99 99 100  CO2 26  --  24 22  BUN 57* 51* 60* 71*  CREATININE 15.01* 13.80* 15.62* 17.87*  CALCIUM 8.6  --  8.2* 8.3*  PROT 7.2  --  6.4  --   BILITOT 0.7  --  1.0  --    ALKPHOS 92  --  83  --   ALT 9  --  9  --   AST 18  --  13  --   GLUCOSE 99 98 77 82    Lactic acid - 1.8  Imaging/Diagnostic Tests: US Duplex RUE 03/23/14-  Other Findings: Subcutaneous edema is present involving nearly the entire visualized forearm. No focal fluid collections are identified.  IMPRESSION: No evidence of right upper extremity deep venous thrombosis. Open radiocephalic fistula.  EKG 03/25/14 - LAFB, NSR, QTc prolongation   CXR 03/25/14 - Pulmonary HTN, no overt pulmonary edema   Lavon Paganini, MD 03/28/2014, 8:32 AM PGY-1, [facility] Intern pager: [phone], text pages welcome

## 2014-03-28 NOTE — Progress Notes (Addendum)
   [facility]  Subjective  - She thinks she is going home today.  Doing well over all.   Objective 138/59 58 98 F (36.7 C) (Oral) 18 98%  Intake/Output Summary (Last 24 hours) at 03/28/14 0906 Last data filed at 03/27/14 2224  Gross per 24 hour  Intake    273 ml  Output   2500 ml  Net  -2227 ml    Xeroform dressing intact. No active bleeding. Distally N/V/M intact.  Mod edema left upper extremity.  Assessment/Planning: POD #2  PROCEDURE: 1. right radiocephalic arteriovenous fistula cannulation under ultrasound guidance 2. right arm fistulogram 3. Venoplasty of subclavian/innominate vein x 4 (4 mm x 80 mm, 6 mm x 100 mm, 8 mm x 100 mm, 10 mm x 40 mm) 4. Venoplasty of superior vena cava (4 mm x 80 mm, 6 mm x 100 mm, 8 mm x 100 mm, 10 mm x 40 mm) 5. Debridement of right radiocephalic arteriovenous fistula ulcer  Working Fistula left forearm  We want to keep th xeroform guaze in place, do not remove it.  She will f/u with Dr. Bridgett Larsson in the office this Friday 03/30/14.  Laurence Slate [facility] 03/28/2014 9:06 AM --  Laboratory Lab Results:  Recent Labs  03/26/14 0523 03/27/14 0752  WBC 2.1* 2.8*  HGB 8.4* 8.4*  HCT 26.1* 26.8*  PLT 78* 100*   BMET  Recent Labs  03/26/14 0523 03/27/14 0753  NA 142 140  K 4.5 5.4*  CL 99 100  CO2 24 22  GLUCOSE 77 82  BUN 60* 71*  CREATININE 15.62* 17.87*  CALCIUM 8.2* 8.3*    COAG Lab Results  Component Value Date   INR 1.14 12/28/2011   INR 1.10 12/13/2010   INR 1.10 01/26/2010   No results found for: PTT  Addendum  I agree with the physician assistant's findings.  Pt will need close follow up of the distal arm wound due to proximity of the wound to the fistula.  I would treat the denuded skin segment similiarly to a skin harvest incision and allow Xeroform to dry over this segment,i.e. Change dry dressing over the Xeroform daily but leave the xeroform.  We will do a wound  check weekly until the skin re-epithelizes.  If the patient bleeds at any time, she will need ligation of the right radiocephalic arteriovenous fistula.  In regards to the R SCV/innominate vein and SVC stenoses, they will need periodic venoplasty to maintain central venous patency.  At some point, a stent might need to be placed which will eventually lead to occlusion of the right central venous structures.  Venous collateralization is already occurring, reinforcing the inevitability of likely R central venous occlusion.  Adele Barthel, MD [facility] Office: [phone] Pager: [phone]  03/28/2014, 10:28 AM

## 2014-03-28 NOTE — Progress Notes (Signed)
[name] for Infectious Disease    Date of Admission:  03/25/2014   Total days of antibiotics 3        Day 3 clindamycin           ID: [name] is a 63 y.o. female with HIV disease, ESRD presetns with AV fistula infection  Active Problems:   Arm swelling   AV fistula infection   HIV (human immunodeficiency virus infection)   Essential hypertension    Subjective: Afebrile, but still having pain associated with fistula site of right arm. Swelling in hand improved, but still has swelling in upper right extremity  She is pod #2 1. right radiocephalic arteriovenous fistula cannulation under ultrasound guidance 2. Venoplasty of subclavian/innominate vein x 4 (4 mm x 80 mm, 6 mm x 100 mm, 8 mm x 100 mm, 10 mm x 40 mm) 4. Venoplasty of superior vena cava (4 mm x 80 mm, 6 mm x 100 mm, 8 mm x 100 mm, 10 mm x 40 mm) 5. Debridement of right radiocephalic arteriovenous fistula ulcer  Medications:  . buPROPion  150 mg Oral Daily  . cefTAZidime (FORTAZ)  IV  2 g Intravenous Q M,W,F  . diltiazem  60 mg Oral 3 times per day  . DULoxetine  90 mg Oral Daily  . fentaNYL  50 mcg Transdermal Q72H  . heparin  5,000 Units Subcutaneous 3 times per day  . [START ON 03/29/2014] lamiVUDine  25 mg Oral Daily  . metoprolol  50 mg Oral BID  . multivitamin  1 tablet Oral QHS  . pantoprazole  40 mg Oral Daily  . polyvinyl alcohol  1 drop Both Eyes BID  . raltegravir  400 mg Oral BID  . sevelamer carbonate  800-1,600 mg Oral TID WC  . sildenafil  20 mg Oral TID  . sodium chloride  3 mL Intravenous Q12H  . sodium chloride  3 mL Intravenous Q12H  . sulfamethoxazole-trimethoprim  1 tablet Oral Once per day on Mon Wed Fri  . vitamin C  500 mg Oral BID  . zidovudine  100 mg Oral TID    Objective: Vital signs in last 24 hours: Temp:  [98 F (36.7 C)-98.1 F (36.7 C)] 98.1 F (36.7 C) (01/27 0955) Pulse Rate:  [58-72] 61 (01/27 0955) Resp:  [18] 18 (01/27 0955) BP:  (130-140)/(59-86) 140/60 mmHg (01/27 0955) SpO2:  [98 %-99 %] 99 % (01/27 0955) Weight:  [120 lb 5.9 oz (54.6 kg)] 120 lb 5.9 oz (54.6 kg) (01/26 2100) Physical Exam  Constitutional:  oriented to person, place, and time. appears well-developed and well-nourished. Appears in mild distress from right arm pain HENT:  Mouth/Throat: Oropharynx is clear and moist. No oropharyngeal exudate.  Cardiovascular: Normal rate, regular rhythm and normal heart sounds. Exam reveals no gallop and no friction rub.  No murmur heard.  Pulmonary/Chest: Effort normal and breath sounds normal. No respiratory distress.  has no wheezes.  Skin: Skin is warm to touch. No erythema. Right arm wrapped at fistula site Ext: right arm is edematous mostly seen in arm/, hand improved. Fistula covered   Lab Results  Recent Labs  03/26/14 0523 03/27/14 0752 03/27/14 0753  WBC 2.1* 2.8*  --   HGB 8.4* 8.4*  --   HCT 26.1* 26.8*  --   NA 142  --  140  K 4.5  --  5.4*  CL 99  --  100  CO2 24  --  22  BUN  60*  --  71*  CREATININE 15.62*  --  17.87*   Liver Panel  Recent Labs  03/26/14 0523 03/27/14 0753  PROT 6.4  --   ALBUMIN 2.8* 2.8*  AST 13  --   ALT 9  --   ALKPHOS 83  --   BILITOT 1.0  --     Microbiology: 1/24 blood cx ngtd x 48 hr   Assessment/Plan: Presumed right av fistula superficial ulcer infection s/p debridement plus venoplasty = currently on ceftaz plus vancomycin.  We will discontinue ceftaz, and continue with vancomycin after HD x 2 wk using 1/24 as day 1 of 14 days.  hiv disease= continue on raltegravir bid, plus lamivudine plus zidovudine. Cd 4 count < 200. Will need bactrim ds TID. Will dose adjust lamivudine  ESRD = continue on renally dosed antivirals and antibiotics  Will sign off and have patient follow up in the ID clinic   [facility], [facility] for Infectious Diseases Cell: [phone] Pager: [phone]  03/28/2014, 4:59 PM

## 2014-03-29 ENCOUNTER — Telehealth: Payer: Self-pay | Admitting: Vascular Surgery

## 2014-03-29 LAB — HEPATITIS B SURFACE ANTIGEN: HEP B S AG: NEGATIVE

## 2014-03-29 LAB — VANCOMYCIN, RANDOM: VANCOMYCIN RM: 36.2 ug/mL

## 2014-03-29 MED ORDER — VANCOMYCIN HCL 500 MG IV SOLR: 500.0000 mg | INTRAVENOUS | Status: DC | PRN

## 2014-03-29 MED ORDER — OXYCODONE HCL 5 MG PO TABS
ORAL_TABLET | ORAL | Status: AC
  Administered 2014-03-29: 5 mg via ORAL
  Filled 2014-03-29: qty 1

## 2014-03-29 MED ORDER — LAMIVUDINE 10 MG/ML PO SOLN: 25.0000 mg | Freq: Every day | ORAL | Status: DC

## 2014-03-29 MED ORDER — SULFAMETHOXAZOLE-TRIMETHOPRIM 400-80 MG PO TABS
1.0000 | ORAL_TABLET | ORAL | Status: DC
Start: 2014-03-29 — End: 2016-04-19

## 2014-03-29 NOTE — Discharge Planning (Signed)
Patient to discharge today after HD per MD order. Patient to return to: [facility] RN to call report prior to transportation to: [phone] Transportation: [facility] (to be scheduled after patient returns to unit after HD)  Nonnie Done, [place] [phone]  [place] (5N 1-16) Clinical Social Worker

## 2014-03-29 NOTE — Progress Notes (Addendum)
ANTIBIOTIC CONSULT NOTE - FOLLOW UP  Pharmacy Consult for Vancomycin Indication: infected R-AVF  Allergies  Allergen Reactions  . Penicillins     REACTION: rash  . Latex Rash  Labs:  Recent Labs  03/27/14 0752 03/27/14 0753  WBC 2.8*  --   HGB 8.4*  --   PLT 100*  --   CREATININE  --  17.87*   Estimated Creatinine Clearance: 2.6 mL/min (by C-G formula based on Cr of 17.87).  Recent Labs  03/27/14 1231  VANCORANDOM 56.9   This level was incorrectly drawn after a post HD vancomycin dose and represents a peak level!  Assessment: 64 year old female on day #5 of 14 days of vancomycin for infected R-AVF.   Pre-HD vancomycin level was incorrectly drawn today during dialysis. Patient received an extra 1g load prior to going to OR on 1/25 and an extra 500mg  post HD on 1/26 without assessing a level. Suspect level may still be elevated. Plan to repeat post-HD level at 4PM today.   Goal of Therapy:  Pre HD level of 15-25 Post HD level of 5-15  Plan:  1. No vancomycin post HD today. 2. Check post-HD level today >4 hours post dialysis (1600) to help determine when it is safe to redose this patient.  3. If patient is to d/c home today -suggest pre-HD level be done prior to dialysis on Saturday to determine if it is safe to redose. The patient's post HD dose based on weight is 500mg .   Sloan Leiter, PharmD, BCPS Clinical Pharmacist [phone]  03/29/2014,10:49 AM  Addendum: This plan was discussed with Dr. Brita Romp, [facility] as patient is to discharge today. Post-HD has been cancelled. The plan is for the patient to have a STAT pre-HD level drawn prior to her next HD as outpatient and results be called to the SNF to determine if patient should receive her next dose of vancomycin.   03/29/2014,1:21 PM

## 2014-03-29 NOTE — Discharge Instructions (Signed)
You were admitted for an infection in your arm.  You will continue to get IV antibiotics with dialysis for a total of 2 weeks.  Cellulitis Cellulitis is an infection of the skin and the tissue beneath it. The infected area is usually red and tender. Cellulitis occurs most often in the arms and lower legs.  CAUSES  Cellulitis is caused by bacteria that enter the skin through cracks or cuts in the skin. The most common types of bacteria that cause cellulitis are staphylococci and streptococci. SIGNS AND SYMPTOMS   Redness and warmth.  Swelling.  Tenderness or pain.  Fever. DIAGNOSIS  Your health care provider can usually determine what is wrong based on a physical exam. Blood tests may also be done. TREATMENT  Treatment usually involves taking an antibiotic medicine. HOME CARE INSTRUCTIONS   Take your antibiotic medicine as directed by your health care provider. Finish the antibiotic even if you start to feel better.  Keep the infected arm or leg elevated to reduce swelling.  Apply a warm cloth to the affected area up to 4 times per day to relieve pain.  Take medicines only as directed by your health care provider.  Keep all follow-up visits as directed by your health care provider. SEEK MEDICAL CARE IF:   You notice red streaks coming from the infected area.  Your red area gets larger or turns dark in color.  Your bone or joint underneath the infected area becomes painful after the skin has healed.  Your infection returns in the same area or another area.  You notice a swollen bump in the infected area.  You develop new symptoms.  You have a fever. SEEK IMMEDIATE MEDICAL CARE IF:   You feel very sleepy.  You develop vomiting or diarrhea.  You have a general ill feeling (malaise) with muscle aches and pains. MAKE SURE YOU:   Understand these instructions.  Will watch your condition.  Will get help right away if you are not doing well or get worse. Document  Released: 11/26/2004 Document Revised: 07/03/2013 Document Reviewed: 05/04/2011 [facility] Patient Information 2015 [place], [place]. This information is not intended to replace advice given to you by your health care provider. Make sure you discuss any questions you have with your health care provider.

## 2014-03-29 NOTE — Telephone Encounter (Signed)
-----   Message from Mena Goes, RN sent at 03/28/2014  4:51 PM EST ----- Regarding: RE: Schedule [facility] wound check is fine. I talked to Arbie Cookey (our [facility]) and she agrees.  ----- Message -----    From: Gena Fray    Sent: 03/28/2014   4:27 PM      To: [facility] Clinical Pool Subject: FW: Schedule                                   Hello!  I have called [name] to arrange for this patient to come in this Friday. I spoke with Arbie Cookey, the RN for her, and she is not flexible at all on Friday. The patient has dialysis from 9:45 to 4:00. Is it possible for [facility] to see this patient next week on Tuesday or does [facility] have to see her?  Thanks, Hinton Dyer ----- Message -----    From: Mena Goes, RN    Sent: 03/28/2014   9:24 AM      To: [facility] Admin Pool Subject: Schedule                                         ----- Message -----    From: Ulyses Amor, PA-C    Sent: 03/28/2014   9:13 AM      To: [facility] Charge Pool  F/U with Dr. Bridgett Larsson in 3 days 03/30/2014 s/p left fore arm fistula ulceration.

## 2014-03-30 ENCOUNTER — Other Ambulatory Visit: Payer: Self-pay | Admitting: *Deleted

## 2014-03-30 ENCOUNTER — Encounter: Payer: Medicare Other | Admitting: Vascular Surgery

## 2014-03-30 DIAGNOSIS — M6281 Muscle weakness (generalized): Secondary | ICD-10-CM | POA: Diagnosis not present

## 2014-03-30 MED ORDER — FENTANYL 50 MCG/HR TD PT72: MEDICATED_PATCH | TRANSDERMAL | Status: DC

## 2014-03-30 NOTE — Telephone Encounter (Signed)
[facility] 

## 2014-03-31 DIAGNOSIS — N186 End stage renal disease: Secondary | ICD-10-CM | POA: Diagnosis not present

## 2014-03-31 DIAGNOSIS — N2581 Secondary hyperparathyroidism of renal origin: Secondary | ICD-10-CM | POA: Diagnosis not present

## 2014-03-31 DIAGNOSIS — Z79899 Other long term (current) drug therapy: Secondary | ICD-10-CM | POA: Diagnosis not present

## 2014-03-31 DIAGNOSIS — D509 Iron deficiency anemia, unspecified: Secondary | ICD-10-CM | POA: Diagnosis not present

## 2014-03-31 DIAGNOSIS — D631 Anemia in chronic kidney disease: Secondary | ICD-10-CM | POA: Diagnosis not present

## 2014-03-31 DIAGNOSIS — Z992 Dependence on renal dialysis: Secondary | ICD-10-CM | POA: Diagnosis not present

## 2014-03-31 LAB — CULTURE, BLOOD (ROUTINE X 2)
CULTURE: NO GROWTH
CULTURE: NO GROWTH

## 2014-04-01 DIAGNOSIS — F331 Major depressive disorder, recurrent, moderate: Secondary | ICD-10-CM | POA: Diagnosis not present

## 2014-04-01 DIAGNOSIS — F411 Generalized anxiety disorder: Secondary | ICD-10-CM | POA: Diagnosis not present

## 2014-04-01 DIAGNOSIS — N186 End stage renal disease: Secondary | ICD-10-CM | POA: Diagnosis not present

## 2014-04-01 DIAGNOSIS — Z992 Dependence on renal dialysis: Secondary | ICD-10-CM | POA: Diagnosis not present

## 2014-04-02 ENCOUNTER — Encounter: Payer: Self-pay | Admitting: Family

## 2014-04-02 DIAGNOSIS — L03113 Cellulitis of right upper limb: Secondary | ICD-10-CM | POA: Diagnosis not present

## 2014-04-02 DIAGNOSIS — M6281 Muscle weakness (generalized): Secondary | ICD-10-CM | POA: Diagnosis not present

## 2014-04-03 ENCOUNTER — Encounter: Payer: Self-pay | Admitting: Family

## 2014-04-03 ENCOUNTER — Ambulatory Visit (INDEPENDENT_AMBULATORY_CARE_PROVIDER_SITE_OTHER): Payer: Self-pay | Admitting: Family

## 2014-04-03 VITALS — BP 141/88 | HR 58 | Temp 97.6°F | Resp 16 | Ht 62.0 in | Wt 114.2 lb

## 2014-04-03 DIAGNOSIS — I770 Arteriovenous fistula, acquired: Secondary | ICD-10-CM

## 2014-04-03 DIAGNOSIS — N186 End stage renal disease: Secondary | ICD-10-CM

## 2014-04-03 DIAGNOSIS — L03113 Cellulitis of right upper limb: Secondary | ICD-10-CM | POA: Diagnosis not present

## 2014-04-03 DIAGNOSIS — Z992 Dependence on renal dialysis: Secondary | ICD-10-CM

## 2014-04-03 DIAGNOSIS — M6281 Muscle weakness (generalized): Secondary | ICD-10-CM | POA: Diagnosis not present

## 2014-04-03 NOTE — Progress Notes (Signed)
Established Dialysis Access  History of Present Illness  [name] is a 64 y.o. ([birth year]) female patient of Dr. Kellie Simmering was previously referred for venous hypertension the right upper extremity. She has dialysis on Monday Wednesday and Friday using a right arm AV fistula which has been present for several years.  She is s/p right AVF fistulogram and debridement of ulcer by Dr. Bridgett Larsson on 03-26-14. She was scheduled to see Dr. Bridgett Larsson on 03/30/14 but was sick and rescheduled for today for post op site check. A yellow piece of xeroform gauze is stuck to the AVF operative site, pt states she was told by hospital personal to leave this in place until seen by Dr. Bridgett Larsson. AVF has an audible bruit and palpable thrill. No draniage or open wound at AVF, HD access is at the proximal end of the AVF. Pt denies pain, tingling, or numbness in left hand.  Left radial pulse is palpable. Bilateral hand grip is 5/5.  Pt is a resident of a nursing facility.    Past Medical History  Diagnosis Date  . Pulmonary HTN   . Hypertension   . Depression   . Insomnia   . Chronic diarrhea   . HIV (human immunodeficiency virus infection)   . C. difficile colitis 10/10/11  . Muscle weakness (generalized)   . Intracranial hemorrhage   . Anxiety   . Anemia   . ESRD on hemodialysis     Social History History  Substance Use Topics  . Smoking status: Former Research scientist (life sciences)  . Smokeless tobacco: Never Used  . Alcohol Use: No    Family History Family History  Problem Relation Age of Onset  . Anesthesia problems Neg Hx   . Hypotension Neg Hx   . Malignant hyperthermia Neg Hx   . Pseudochol deficiency Neg Hx     Surgical History Past Surgical History  Procedure Laterality Date  . Dialysis shunts      previous one removed from left arm and now present one in right arm  . Esophagogastroduodenoscopy  12/15/2010    Procedure: ESOPHAGOGASTRODUODENOSCOPY (EGD);  Surgeon: Daneil Dolin, MD;  Location: [facility];  Service: Endoscopy;  Laterality: N/A;  . Tonsillectomy    . Biopsy thyroid    . Excision of breast biopsy Right 05/04/2012    Procedure: EXCISION OF BREAST BIOPSY;  Surgeon: Donato Heinz, MD;  Location: [facility] ORS;  Service: General;  Laterality: Right;  Right Excisional Breast Biopsy  . Shuntogram Right 10/19/2013    Procedure: FISTULOGRAM;  Surgeon: Conrad Mertztown, MD;  Location: [facility] CATH LAB;  Service: Cardiovascular;  Laterality: Right;  . Fistulogram Right 03/26/2014    Procedure: Right Arm Fistulogram with Venoplasty Right Subclavian Vein and Inominate Vein. Debridement Fistula Ulcer;  Surgeon: Conrad Everson, MD;  Location: [facility];  Service: Vascular;  Laterality: Right;    Allergies  Allergen Reactions  . Penicillins     REACTION: rash  . Latex Rash    Current Outpatient Prescriptions  Medication Sig Dispense Refill  . buPROPion (WELLBUTRIN XL) 150 MG 24 hr tablet Take 150 mg by mouth daily.    Marland Kitchen diltiazem (CARDIZEM) 60 MG tablet Take 60 mg by mouth every 8 (eight) hours.    . DULoxetine (CYMBALTA) 30 MG capsule Take 30 mg by mouth daily. Take with 60mg  capsule for total of 90 mg.    . DULoxetine (CYMBALTA) 60 MG capsule Take 60 mg by mouth daily. Take with 30mg  capsule for  a total of 90mg     . epoetin alfa (EPOGEN,PROCRIT) 60454 UNIT/ML injection Inject 8,000 Units into the skin 3 (three) times a week. To be given at dialysis, Monday Wednesday and Friday    . fentaNYL (DURAGESIC - DOSED MCG/HR) 50 MCG/HR Apply one patch every 72 hours for pain. Remove old patch. External use only. Rotate sites. 10 patch 0  . hydroxypropyl methylcellulose (ISOPTO TEARS) 2.5 % ophthalmic solution Place 1 drop into both eyes 2 (two) times daily.      Marland Kitchen ipratropium-albuterol (DUONEB) 0.5-2.5 (3) MG/3ML SOLN Take 3 mLs by nebulization every 6 (six) hours as needed. For shortness of breath/ wheezing    . lamiVUDine (EPIVIR) 10 MG/ML solution Take 2.5 mLs (25 mg total) by mouth daily. 240 mL 0  .  lidocaine-prilocaine (EMLA) cream Apply 1 application topically 3 (three) times a week. Apply 1 hour prior to dialysis access site on Monday Wednesday and Friday    . LORazepam (ATIVAN) 0.5 MG tablet Take 0.25 mg by mouth every 6 (six) hours as needed for anxiety.    . metoprolol (LOPRESSOR) 50 MG tablet Take 50 mg by mouth 2 (two) times daily.    . multivitamin (RENA-VIT) TABS tablet Take 1 tablet by mouth at bedtime.    Marland Kitchen omeprazole (PRILOSEC) 20 MG capsule Take 40 mg by mouth daily.    . ondansetron (ZOFRAN) 4 MG tablet Take 2 mg by mouth 3 (three) times daily. nausea    . oxyCODONE (OXY IR/ROXICODONE) 5 MG immediate release tablet Take 5 mg by mouth every 6 (six) hours as needed for moderate pain.     . raltegravir (ISENTRESS) 400 MG tablet Take 400 mg by mouth 2 (two) times daily.     . sevelamer carbonate (RENVELA) 800 MG tablet Take 800-1,600 mg by mouth 3 (three) times daily with meals.     . sildenafil (REVATIO) 20 MG tablet Take 20 mg by mouth 3 (three) times daily.     Marland Kitchen sulfamethoxazole-trimethoprim (BACTRIM,SEPTRA) 400-80 MG per tablet Take 1 tablet by mouth 3 (three) times a week. 30 tablet 0  . vancomycin 500 mg in sodium chloride 0.9 % 100 mL Inject 500 mg into the vein every dialysis. 500 mg 0  . vitamin C (ASCORBIC ACID) 500 MG tablet Take 500 mg by mouth 2 (two) times daily.    . zaleplon (SONATA) 5 MG capsule Take 5 mg by mouth at bedtime.    . zidovudine (RETROVIR) 100 MG capsule Take 100 mg by mouth 3 (three) times daily.       No current facility-administered medications for this visit.     REVIEW OF SYSTEMS: see HPI for pertinent positives and negatives    PHYSICAL EXAMINATION:  Filed Vitals:   04/03/14 1412  BP: 141/88  Pulse: 58  Temp: 97.6 F (36.4 C)  TempSrc: Oral  Resp: 16  Height: 5\' 2"  (1.575 m)  Weight: 114 lb 3.2 oz (51.8 kg)  SpO2: 96%   Body mass index is 20.88 kg/(m^2).  General: The patient appears their stated age.   HEENT:  No gross  abnormalities Pulmonary: Respirations are non-labored Musculoskeletal: There are no major deformities.   Neurologic: No focal weakness or paresthesias are detected. Skin: There are no ulcer or rashes noted. Psychiatric: The patient has normal affect. Cardiovascular: There is a regular rate and rhythm.    Medical Decision Making  [name] is a 64 y.o. female who s/p right AVF fistulogram and debridement of ulcer by  Dr. Bridgett Larsson on 03-26-14. She was scheduled to see Dr. Bridgett Larsson on 03/30/14 but was sick and rescheduled for today for post op site check. A yellow piece of xeroform gauze is stuck to the AVF operative site, pt states she was told by hospital personal to leave this in place until seen by Dr. Bridgett Larsson. AVF has an audible bruit and palpable thrill. No draniage or open wound at AVF, HD access is at the proximal end of the AVF. Pt denies pain, tingling, or numbness in left hand.  Left radial pulse is palpable. Bilateral hand grip is 5/5. Dr. Kellie Simmering spoke with and examined pt. No evidence of infection, no drainage, no open wound. Return on Friday 04/06/14 for evaluation of operative site by Dr. Bridgett Larsson.  Darcella Shiffman, Sharmon Leyden, RN, MSN, FNP-C [facility] [place] Office: [phone]  04/03/2014, 2:17 PM  Clinic MD: Kellie Simmering

## 2014-04-04 ENCOUNTER — Non-Acute Institutional Stay (SKILLED_NURSING_FACILITY): Payer: Medicare Other | Admitting: Internal Medicine

## 2014-04-04 ENCOUNTER — Telehealth: Payer: Self-pay | Admitting: Family Medicine

## 2014-04-04 ENCOUNTER — Encounter: Payer: Self-pay | Admitting: Vascular Surgery

## 2014-04-04 DIAGNOSIS — N186 End stage renal disease: Secondary | ICD-10-CM | POA: Diagnosis not present

## 2014-04-04 DIAGNOSIS — L03113 Cellulitis of right upper limb: Secondary | ICD-10-CM | POA: Diagnosis not present

## 2014-04-04 DIAGNOSIS — E611 Iron deficiency: Secondary | ICD-10-CM | POA: Diagnosis not present

## 2014-04-04 DIAGNOSIS — N2581 Secondary hyperparathyroidism of renal origin: Secondary | ICD-10-CM | POA: Diagnosis not present

## 2014-04-04 DIAGNOSIS — D61818 Other pancytopenia: Secondary | ICD-10-CM

## 2014-04-04 DIAGNOSIS — B20 Human immunodeficiency virus [HIV] disease: Secondary | ICD-10-CM | POA: Diagnosis not present

## 2014-04-04 DIAGNOSIS — D631 Anemia in chronic kidney disease: Secondary | ICD-10-CM | POA: Diagnosis not present

## 2014-04-04 DIAGNOSIS — Z992 Dependence on renal dialysis: Secondary | ICD-10-CM | POA: Diagnosis not present

## 2014-04-04 DIAGNOSIS — Z5181 Encounter for therapeutic drug level monitoring: Secondary | ICD-10-CM | POA: Diagnosis not present

## 2014-04-04 NOTE — Telephone Encounter (Signed)
I'm not sure who "Ms [name]" is.  No number is provided.  Will call back if a number is given.  However, this patient is not even an [facility] patient.

## 2014-04-04 NOTE — Telephone Encounter (Signed)
Please call  Ms Ulanda Edison  She has a question about a query in the patients record

## 2014-04-05 DIAGNOSIS — L03113 Cellulitis of right upper limb: Secondary | ICD-10-CM | POA: Diagnosis not present

## 2014-04-05 DIAGNOSIS — G894 Chronic pain syndrome: Secondary | ICD-10-CM | POA: Diagnosis not present

## 2014-04-05 DIAGNOSIS — M6281 Muscle weakness (generalized): Secondary | ICD-10-CM | POA: Diagnosis not present

## 2014-04-06 ENCOUNTER — Ambulatory Visit (INDEPENDENT_AMBULATORY_CARE_PROVIDER_SITE_OTHER): Payer: Self-pay | Admitting: Vascular Surgery

## 2014-04-06 ENCOUNTER — Encounter: Payer: Self-pay | Admitting: Vascular Surgery

## 2014-04-06 VITALS — BP 163/78 | HR 59 | Ht 62.0 in | Wt 116.8 lb

## 2014-04-06 DIAGNOSIS — Z992 Dependence on renal dialysis: Secondary | ICD-10-CM | POA: Diagnosis not present

## 2014-04-06 DIAGNOSIS — D631 Anemia in chronic kidney disease: Secondary | ICD-10-CM | POA: Diagnosis not present

## 2014-04-06 DIAGNOSIS — L03113 Cellulitis of right upper limb: Secondary | ICD-10-CM | POA: Diagnosis not present

## 2014-04-06 DIAGNOSIS — M6281 Muscle weakness (generalized): Secondary | ICD-10-CM | POA: Diagnosis not present

## 2014-04-06 DIAGNOSIS — E611 Iron deficiency: Secondary | ICD-10-CM | POA: Diagnosis not present

## 2014-04-06 DIAGNOSIS — I871 Compression of vein: Secondary | ICD-10-CM

## 2014-04-06 DIAGNOSIS — Z5181 Encounter for therapeutic drug level monitoring: Secondary | ICD-10-CM | POA: Diagnosis not present

## 2014-04-06 DIAGNOSIS — N186 End stage renal disease: Secondary | ICD-10-CM | POA: Diagnosis not present

## 2014-04-06 DIAGNOSIS — N2581 Secondary hyperparathyroidism of renal origin: Secondary | ICD-10-CM | POA: Diagnosis not present

## 2014-04-06 NOTE — Progress Notes (Signed)
    Postoperative Access Visit   History of Present Illness  [name] is a 64 y.o. year old female who presents for postoperative follow-up for:  PROCEDURE: 1. right radiocephalic arteriovenous fistula cannulation under ultrasound guidance 2. right arm fistulogram 3. Venoplasty of subclavian/innominate vein x 4 (4 mm x 80 mm, 6 mm x 100 mm, 8 mm x 100 mm, 10 mm x 40 mm) 4. Venoplasty of superior vena cava (4 mm x 80 mm, 6 mm x 100 mm, 8 mm x 100 mm, 10 mm x 40 mm) 5. Debridement of right radiocephalic arteriovenous fistula ulcer (Date: 03/27/14).    The patient's wounds are healing.  The patient notes no steal symptoms.  The patient is able to complete their activities of daily living.  The patient's current symptoms are: continued R arm swelling.  For VQI Use Only  PRE-ADM LIVING: Nursing home  AMB STATUS: Ambulatory  Physical Examination Filed Vitals:   04/06/14 1511  BP: 163/78  Pulse: 59    RUE: ulcerated area re-epithelializing, skin feels warm, hand grip is 5/5, sensation in digits is intact, +palpable thrill, bruit can be auscultated , swelling in R arm improved  Medical Decision Making  [name] is a 64 y.o. year old female who presents s/p Venoplasty R SCV/innominate vein/SVC, debridement of R RC AVF ulcer.   Avoid using healing area overlying AVF until fully re-epithelialized.  The patient's central venous stenoses will likely need venoplasty periodically.  Pt knows to contact us if she develops recurrent arm swelling.  Given the location of the stenoses, I am reluctant to stent as I suspect one of the lesions is likely related venous thoracic compression.  The other involves the SVC, so stenting that lesion inappropriately might jail out a RIJ TDC.  At some point, the R central veins will occlude and the R RC AVF will need to be ligated.  Thank you for allowing [place] to participate in this patient's care.  Adele Barthel, MD [facility] Office: [phone] Pager: [phone]  04/06/2014, 4:02 PM

## 2014-04-07 NOTE — Progress Notes (Signed)
Patient ID: [name], female   DOB: 1950/11/24, 64 y.o.   MRN: [id number]               HISTORY & PHYSICAL  DATE:  04/04/2014              FACILITY: [name]      LEVEL OF CARE:   SNF   CHIEF COMPLAINT:  Follow up right upper extremity cellulitis versus fistula infection.    HISTORY OF PRESENT ILLNESS:  This patient was sent to the emergency room on 03/23/2014 for right upper extremity redness, swelling, and pain.   She was noted to have subcutaneous edema of the right arm.  There was no DVT on duplex ultrasound.  She was treated for cellulitis with vancomycin and p.o. Septra, with the vancomycin arranged after discussing with Nephrology.  She was sent back to the facility.  However, her symptoms worsened and the facility became concerned that there was extending edema outside of the arm, as I understand this.  She was admitted to hospital on 03/25/2014 and stayed through 03/29/2014.    Looking at this, it appears they felt that she had right upper extremity cellulitis.   Blood cultures were drawn.  However, the patient had already been on antibiotics.    She saw Vascular Surgery, who took her to the OR on 03/26/2014 for debridement, fistulogram, and venoplasty.  There were no signs of an AV fistula infection in the OR.   Blood cultures again were negative.  Per ID, the patient was to receive a two-week course of IV vanc at hemodialysis.    It was worthwhile noting that I had previously evaluated her for edema of her arm in the summer.  At that point, she also was felt to have edema of her neck and her face.  A CT angiogram was done that showed stenosis of the subclavian and brachiocephalic veins.  She was taken care of by Vascular Surgery.    Also of note from this admission, her CD4 count was 140.  She is on lamivudine, raltegravir, and zidovudine.  Infectious Disease recommended starting Bactrim.  This is for PCP prophylaxis.         PAST MEDICAL HISTORY/PROBLEM LIST:           End-stage renal disease.  On dialysis.    Pancytopenia.  Likely related to HIV versus iatrogenic medications.    Felt to have right upper extremity cellulitis.    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    Chronic pain syndrome.     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    Pulmonary artery hypertension of unclear etiology.    CURRENT MEDICATIONS:  Discharge medication list was reviewed.    Epivir was stopped.    She is on:    Wellbutrin 150 q.d.     Diltiazem 60 q.8.    Cymbalta 90 q.d.     Epogen 8000 U three times a week at dialysis.    Fentanyl 50 q.72.    DuoNebs q.6 hours p.r.n.      Epivir 25 mg daily.      EMLA cream to her fistula prior to dialysis.    Ativan 0.25 q.6.    Metoprolol 50 b.i.d.      Omeprazole 40 q.d.      Ondansetron 2 mg three times daily.    Oxycodone q.6 p.r.n.  Raltegravir 400 b.i.d.      Renvela 1600 mg three times a day.    Revatio 20 mg three times daily.     Septra 1 tablet three times a week.    Vancomycin at dialysis for two weeks.     Sonata 5 mg at bedtime.    Retrovir 100 mg three times daily.    REVIEW OF SYSTEMS:         CHEST/RESPIRATORY:  The patient is not complaining of shortness of breath or chest pain.   CARDIAC:   No exertional chest pain.     GI:  No nausea, vomiting or diarrhea.     PHYSICAL EXAMINATION:              GENERAL APPEARANCE:  The patient actually looks remarkably well.   CHEST/RESPIRATORY:  Clear air entry bilaterally.     CARDIOVASCULAR:  CARDIAC:   Heart sounds are normal.  There are no murmurs.   GASTROINTESTINAL:  LIVER/SPLEEN/KIDNEYS:  No liver, no spleen.  No tenderness.   CIRCULATION:  EDEMA/VARICOSITIES:  Extremities:  No evidence of a DVT in the lower extremities.  In the right arm, there is some swelling.  However, there is no edema, no warmth, and no tenderness.   There are no epitrochlear or axillary nodes.  The patient tells me there was edema on her chest wall before she went to the hospital.  I see none of this.  No edema in her neck or her face.    ASSESSMENT/PLAN:                   Cellulitis of the right arm.  This appears to be very stable at the moment.  I see nothing further that needs to be done.  She does have a history of stenosis of her central veins of that arm, although I see no evidence to be particularly perturbed about this.  She did have an angioplasty.  Don't see refernece to this fact in her hosptial record  HIV.  Her CD4 count seems to have dropped, somewhat disturbingly.  I note the adjustments in her medications.    Chronic renal failure.  On dialysis through a shunt in the right arm.    Other issues as noted above.    The patient appears to be remarkably stable for the moment.    I think her blood counts are going to need to be followed and I will try to take care of this.  She left the hospital with a white count of 2.8, a hemoglobin of 8.4, and a platelet count of 100.  I note the white count got down to a low of 2.1 and the platelet count down to a low of 78,000.    I have once again verified that the patient is actually taking her medications as prescribed, especially her HIV medications.  She does have a history of refusing to go to dialysis.  I have not been informed of any medication refusals.  I also note an increase in her Wellbutrin to 300 mg a day by Psychiatry on 04/01/2014.

## 2014-04-09 DIAGNOSIS — D631 Anemia in chronic kidney disease: Secondary | ICD-10-CM | POA: Diagnosis not present

## 2014-04-09 DIAGNOSIS — M6281 Muscle weakness (generalized): Secondary | ICD-10-CM | POA: Diagnosis not present

## 2014-04-09 DIAGNOSIS — L03113 Cellulitis of right upper limb: Secondary | ICD-10-CM | POA: Diagnosis not present

## 2014-04-09 DIAGNOSIS — Z5181 Encounter for therapeutic drug level monitoring: Secondary | ICD-10-CM | POA: Diagnosis not present

## 2014-04-09 DIAGNOSIS — E611 Iron deficiency: Secondary | ICD-10-CM | POA: Diagnosis not present

## 2014-04-09 DIAGNOSIS — N186 End stage renal disease: Secondary | ICD-10-CM | POA: Diagnosis not present

## 2014-04-09 DIAGNOSIS — Z992 Dependence on renal dialysis: Secondary | ICD-10-CM | POA: Diagnosis not present

## 2014-04-09 DIAGNOSIS — N2581 Secondary hyperparathyroidism of renal origin: Secondary | ICD-10-CM | POA: Diagnosis not present

## 2014-04-10 DIAGNOSIS — M6281 Muscle weakness (generalized): Secondary | ICD-10-CM | POA: Diagnosis not present

## 2014-04-10 DIAGNOSIS — F411 Generalized anxiety disorder: Secondary | ICD-10-CM | POA: Diagnosis not present

## 2014-04-10 DIAGNOSIS — L03113 Cellulitis of right upper limb: Secondary | ICD-10-CM | POA: Diagnosis not present

## 2014-04-10 DIAGNOSIS — F331 Major depressive disorder, recurrent, moderate: Secondary | ICD-10-CM | POA: Diagnosis not present

## 2014-04-11 DIAGNOSIS — M6281 Muscle weakness (generalized): Secondary | ICD-10-CM | POA: Diagnosis not present

## 2014-04-11 DIAGNOSIS — L03113 Cellulitis of right upper limb: Secondary | ICD-10-CM | POA: Diagnosis not present

## 2014-04-12 DIAGNOSIS — M6281 Muscle weakness (generalized): Secondary | ICD-10-CM | POA: Diagnosis not present

## 2014-04-12 DIAGNOSIS — L03113 Cellulitis of right upper limb: Secondary | ICD-10-CM | POA: Diagnosis not present

## 2014-04-13 DIAGNOSIS — Z5181 Encounter for therapeutic drug level monitoring: Secondary | ICD-10-CM | POA: Diagnosis not present

## 2014-04-13 DIAGNOSIS — E611 Iron deficiency: Secondary | ICD-10-CM | POA: Diagnosis not present

## 2014-04-13 DIAGNOSIS — D631 Anemia in chronic kidney disease: Secondary | ICD-10-CM | POA: Diagnosis not present

## 2014-04-13 DIAGNOSIS — N186 End stage renal disease: Secondary | ICD-10-CM | POA: Diagnosis not present

## 2014-04-13 DIAGNOSIS — Z992 Dependence on renal dialysis: Secondary | ICD-10-CM | POA: Diagnosis not present

## 2014-04-13 DIAGNOSIS — N2581 Secondary hyperparathyroidism of renal origin: Secondary | ICD-10-CM | POA: Diagnosis not present

## 2014-04-16 DIAGNOSIS — M6281 Muscle weakness (generalized): Secondary | ICD-10-CM | POA: Diagnosis not present

## 2014-04-16 DIAGNOSIS — L03113 Cellulitis of right upper limb: Secondary | ICD-10-CM | POA: Diagnosis not present

## 2014-04-16 DIAGNOSIS — Z79899 Other long term (current) drug therapy: Secondary | ICD-10-CM | POA: Diagnosis not present

## 2014-04-16 DIAGNOSIS — D649 Anemia, unspecified: Secondary | ICD-10-CM | POA: Diagnosis not present

## 2014-04-17 DIAGNOSIS — D631 Anemia in chronic kidney disease: Secondary | ICD-10-CM | POA: Diagnosis not present

## 2014-04-17 DIAGNOSIS — Z5181 Encounter for therapeutic drug level monitoring: Secondary | ICD-10-CM | POA: Diagnosis not present

## 2014-04-17 DIAGNOSIS — Z992 Dependence on renal dialysis: Secondary | ICD-10-CM | POA: Diagnosis not present

## 2014-04-17 DIAGNOSIS — N186 End stage renal disease: Secondary | ICD-10-CM | POA: Diagnosis not present

## 2014-04-17 DIAGNOSIS — N2581 Secondary hyperparathyroidism of renal origin: Secondary | ICD-10-CM | POA: Diagnosis not present

## 2014-04-17 DIAGNOSIS — E611 Iron deficiency: Secondary | ICD-10-CM | POA: Diagnosis not present

## 2014-04-18 DIAGNOSIS — E611 Iron deficiency: Secondary | ICD-10-CM | POA: Diagnosis not present

## 2014-04-18 DIAGNOSIS — L03113 Cellulitis of right upper limb: Secondary | ICD-10-CM | POA: Diagnosis not present

## 2014-04-18 DIAGNOSIS — D631 Anemia in chronic kidney disease: Secondary | ICD-10-CM | POA: Diagnosis not present

## 2014-04-18 DIAGNOSIS — Z5181 Encounter for therapeutic drug level monitoring: Secondary | ICD-10-CM | POA: Diagnosis not present

## 2014-04-18 DIAGNOSIS — M6281 Muscle weakness (generalized): Secondary | ICD-10-CM | POA: Diagnosis not present

## 2014-04-18 DIAGNOSIS — N2581 Secondary hyperparathyroidism of renal origin: Secondary | ICD-10-CM | POA: Diagnosis not present

## 2014-04-18 DIAGNOSIS — N186 End stage renal disease: Secondary | ICD-10-CM | POA: Diagnosis not present

## 2014-04-18 DIAGNOSIS — Z992 Dependence on renal dialysis: Secondary | ICD-10-CM | POA: Diagnosis not present

## 2014-04-19 DIAGNOSIS — M6281 Muscle weakness (generalized): Secondary | ICD-10-CM | POA: Diagnosis not present

## 2014-04-19 DIAGNOSIS — L03113 Cellulitis of right upper limb: Secondary | ICD-10-CM | POA: Diagnosis not present

## 2014-04-20 DIAGNOSIS — L03113 Cellulitis of right upper limb: Secondary | ICD-10-CM | POA: Diagnosis not present

## 2014-04-20 DIAGNOSIS — M6281 Muscle weakness (generalized): Secondary | ICD-10-CM | POA: Diagnosis not present

## 2014-04-25 DIAGNOSIS — Z5181 Encounter for therapeutic drug level monitoring: Secondary | ICD-10-CM | POA: Diagnosis not present

## 2014-04-25 DIAGNOSIS — Z992 Dependence on renal dialysis: Secondary | ICD-10-CM | POA: Diagnosis not present

## 2014-04-25 DIAGNOSIS — E611 Iron deficiency: Secondary | ICD-10-CM | POA: Diagnosis not present

## 2014-04-25 DIAGNOSIS — N186 End stage renal disease: Secondary | ICD-10-CM | POA: Diagnosis not present

## 2014-04-25 DIAGNOSIS — N2581 Secondary hyperparathyroidism of renal origin: Secondary | ICD-10-CM | POA: Diagnosis not present

## 2014-04-25 DIAGNOSIS — D631 Anemia in chronic kidney disease: Secondary | ICD-10-CM | POA: Diagnosis not present

## 2014-04-27 DIAGNOSIS — E611 Iron deficiency: Secondary | ICD-10-CM | POA: Diagnosis not present

## 2014-04-27 DIAGNOSIS — D631 Anemia in chronic kidney disease: Secondary | ICD-10-CM | POA: Diagnosis not present

## 2014-04-27 DIAGNOSIS — N186 End stage renal disease: Secondary | ICD-10-CM | POA: Diagnosis not present

## 2014-04-27 DIAGNOSIS — Z992 Dependence on renal dialysis: Secondary | ICD-10-CM | POA: Diagnosis not present

## 2014-04-27 DIAGNOSIS — N2581 Secondary hyperparathyroidism of renal origin: Secondary | ICD-10-CM | POA: Diagnosis not present

## 2014-04-27 DIAGNOSIS — Z5181 Encounter for therapeutic drug level monitoring: Secondary | ICD-10-CM | POA: Diagnosis not present

## 2014-04-28 DIAGNOSIS — F411 Generalized anxiety disorder: Secondary | ICD-10-CM | POA: Diagnosis not present

## 2014-04-28 DIAGNOSIS — F331 Major depressive disorder, recurrent, moderate: Secondary | ICD-10-CM | POA: Diagnosis not present

## 2014-04-30 DIAGNOSIS — E611 Iron deficiency: Secondary | ICD-10-CM | POA: Diagnosis not present

## 2014-04-30 DIAGNOSIS — Z992 Dependence on renal dialysis: Secondary | ICD-10-CM | POA: Diagnosis not present

## 2014-04-30 DIAGNOSIS — N2581 Secondary hyperparathyroidism of renal origin: Secondary | ICD-10-CM | POA: Diagnosis not present

## 2014-04-30 DIAGNOSIS — Z5181 Encounter for therapeutic drug level monitoring: Secondary | ICD-10-CM | POA: Diagnosis not present

## 2014-04-30 DIAGNOSIS — D631 Anemia in chronic kidney disease: Secondary | ICD-10-CM | POA: Diagnosis not present

## 2014-04-30 DIAGNOSIS — N186 End stage renal disease: Secondary | ICD-10-CM | POA: Diagnosis not present

## 2014-05-01 ENCOUNTER — Other Ambulatory Visit: Payer: Self-pay | Admitting: *Deleted

## 2014-05-01 DIAGNOSIS — F411 Generalized anxiety disorder: Secondary | ICD-10-CM | POA: Diagnosis not present

## 2014-05-01 DIAGNOSIS — F331 Major depressive disorder, recurrent, moderate: Secondary | ICD-10-CM | POA: Diagnosis not present

## 2014-05-01 MED ORDER — ZALEPLON 5 MG PO CAPS: ORAL_CAPSULE | ORAL | Status: DC

## 2014-05-01 NOTE — Telephone Encounter (Signed)
[facility] 

## 2014-05-02 DIAGNOSIS — Z992 Dependence on renal dialysis: Secondary | ICD-10-CM | POA: Diagnosis not present

## 2014-05-02 DIAGNOSIS — D631 Anemia in chronic kidney disease: Secondary | ICD-10-CM | POA: Diagnosis not present

## 2014-05-02 DIAGNOSIS — N2581 Secondary hyperparathyroidism of renal origin: Secondary | ICD-10-CM | POA: Diagnosis not present

## 2014-05-02 DIAGNOSIS — N186 End stage renal disease: Secondary | ICD-10-CM | POA: Diagnosis not present

## 2014-05-02 DIAGNOSIS — E611 Iron deficiency: Secondary | ICD-10-CM | POA: Diagnosis not present

## 2014-05-03 DIAGNOSIS — G894 Chronic pain syndrome: Secondary | ICD-10-CM | POA: Diagnosis not present

## 2014-05-04 DIAGNOSIS — N2581 Secondary hyperparathyroidism of renal origin: Secondary | ICD-10-CM | POA: Diagnosis not present

## 2014-05-04 DIAGNOSIS — Z992 Dependence on renal dialysis: Secondary | ICD-10-CM | POA: Diagnosis not present

## 2014-05-04 DIAGNOSIS — D631 Anemia in chronic kidney disease: Secondary | ICD-10-CM | POA: Diagnosis not present

## 2014-05-04 DIAGNOSIS — N186 End stage renal disease: Secondary | ICD-10-CM | POA: Diagnosis not present

## 2014-05-04 DIAGNOSIS — E611 Iron deficiency: Secondary | ICD-10-CM | POA: Diagnosis not present

## 2014-05-07 DIAGNOSIS — N2581 Secondary hyperparathyroidism of renal origin: Secondary | ICD-10-CM | POA: Diagnosis not present

## 2014-05-07 DIAGNOSIS — D631 Anemia in chronic kidney disease: Secondary | ICD-10-CM | POA: Diagnosis not present

## 2014-05-07 DIAGNOSIS — Z992 Dependence on renal dialysis: Secondary | ICD-10-CM | POA: Diagnosis not present

## 2014-05-07 DIAGNOSIS — N186 End stage renal disease: Secondary | ICD-10-CM | POA: Diagnosis not present

## 2014-05-07 DIAGNOSIS — E611 Iron deficiency: Secondary | ICD-10-CM | POA: Diagnosis not present

## 2014-05-09 DIAGNOSIS — N2581 Secondary hyperparathyroidism of renal origin: Secondary | ICD-10-CM | POA: Diagnosis not present

## 2014-05-09 DIAGNOSIS — D631 Anemia in chronic kidney disease: Secondary | ICD-10-CM | POA: Diagnosis not present

## 2014-05-09 DIAGNOSIS — N186 End stage renal disease: Secondary | ICD-10-CM | POA: Diagnosis not present

## 2014-05-09 DIAGNOSIS — E611 Iron deficiency: Secondary | ICD-10-CM | POA: Diagnosis not present

## 2014-05-09 DIAGNOSIS — Z992 Dependence on renal dialysis: Secondary | ICD-10-CM | POA: Diagnosis not present

## 2014-05-11 DIAGNOSIS — N2581 Secondary hyperparathyroidism of renal origin: Secondary | ICD-10-CM | POA: Diagnosis not present

## 2014-05-11 DIAGNOSIS — Z992 Dependence on renal dialysis: Secondary | ICD-10-CM | POA: Diagnosis not present

## 2014-05-11 DIAGNOSIS — N186 End stage renal disease: Secondary | ICD-10-CM | POA: Diagnosis not present

## 2014-05-11 DIAGNOSIS — D631 Anemia in chronic kidney disease: Secondary | ICD-10-CM | POA: Diagnosis not present

## 2014-05-11 DIAGNOSIS — E611 Iron deficiency: Secondary | ICD-10-CM | POA: Diagnosis not present

## 2014-05-14 ENCOUNTER — Other Ambulatory Visit: Payer: Self-pay | Admitting: *Deleted

## 2014-05-14 MED ORDER — ZALEPLON 5 MG PO CAPS: ORAL_CAPSULE | ORAL | Status: DC

## 2014-05-14 NOTE — Telephone Encounter (Signed)
[facility] 

## 2014-05-15 DIAGNOSIS — F331 Major depressive disorder, recurrent, moderate: Secondary | ICD-10-CM | POA: Diagnosis not present

## 2014-05-15 DIAGNOSIS — F411 Generalized anxiety disorder: Secondary | ICD-10-CM | POA: Diagnosis not present

## 2014-05-16 DIAGNOSIS — N186 End stage renal disease: Secondary | ICD-10-CM | POA: Diagnosis not present

## 2014-05-16 DIAGNOSIS — D631 Anemia in chronic kidney disease: Secondary | ICD-10-CM | POA: Diagnosis not present

## 2014-05-16 DIAGNOSIS — Z992 Dependence on renal dialysis: Secondary | ICD-10-CM | POA: Diagnosis not present

## 2014-05-16 DIAGNOSIS — E611 Iron deficiency: Secondary | ICD-10-CM | POA: Diagnosis not present

## 2014-05-16 DIAGNOSIS — N2581 Secondary hyperparathyroidism of renal origin: Secondary | ICD-10-CM | POA: Diagnosis not present

## 2014-05-18 DIAGNOSIS — N2581 Secondary hyperparathyroidism of renal origin: Secondary | ICD-10-CM | POA: Diagnosis not present

## 2014-05-18 DIAGNOSIS — Z992 Dependence on renal dialysis: Secondary | ICD-10-CM | POA: Diagnosis not present

## 2014-05-18 DIAGNOSIS — D631 Anemia in chronic kidney disease: Secondary | ICD-10-CM | POA: Diagnosis not present

## 2014-05-18 DIAGNOSIS — E611 Iron deficiency: Secondary | ICD-10-CM | POA: Diagnosis not present

## 2014-05-18 DIAGNOSIS — N186 End stage renal disease: Secondary | ICD-10-CM | POA: Diagnosis not present

## 2014-05-21 DIAGNOSIS — N2581 Secondary hyperparathyroidism of renal origin: Secondary | ICD-10-CM | POA: Diagnosis not present

## 2014-05-21 DIAGNOSIS — N186 End stage renal disease: Secondary | ICD-10-CM | POA: Diagnosis not present

## 2014-05-21 DIAGNOSIS — Z992 Dependence on renal dialysis: Secondary | ICD-10-CM | POA: Diagnosis not present

## 2014-05-21 DIAGNOSIS — E611 Iron deficiency: Secondary | ICD-10-CM | POA: Diagnosis not present

## 2014-05-21 DIAGNOSIS — D631 Anemia in chronic kidney disease: Secondary | ICD-10-CM | POA: Diagnosis not present

## 2014-05-23 DIAGNOSIS — E611 Iron deficiency: Secondary | ICD-10-CM | POA: Diagnosis not present

## 2014-05-23 DIAGNOSIS — Z992 Dependence on renal dialysis: Secondary | ICD-10-CM | POA: Diagnosis not present

## 2014-05-23 DIAGNOSIS — D631 Anemia in chronic kidney disease: Secondary | ICD-10-CM | POA: Diagnosis not present

## 2014-05-23 DIAGNOSIS — N2581 Secondary hyperparathyroidism of renal origin: Secondary | ICD-10-CM | POA: Diagnosis not present

## 2014-05-23 DIAGNOSIS — N186 End stage renal disease: Secondary | ICD-10-CM | POA: Diagnosis not present

## 2014-05-28 DIAGNOSIS — Z992 Dependence on renal dialysis: Secondary | ICD-10-CM | POA: Diagnosis not present

## 2014-05-28 DIAGNOSIS — N2581 Secondary hyperparathyroidism of renal origin: Secondary | ICD-10-CM | POA: Diagnosis not present

## 2014-05-28 DIAGNOSIS — E611 Iron deficiency: Secondary | ICD-10-CM | POA: Diagnosis not present

## 2014-05-28 DIAGNOSIS — D631 Anemia in chronic kidney disease: Secondary | ICD-10-CM | POA: Diagnosis not present

## 2014-05-28 DIAGNOSIS — N186 End stage renal disease: Secondary | ICD-10-CM | POA: Diagnosis not present

## 2014-05-29 DIAGNOSIS — F411 Generalized anxiety disorder: Secondary | ICD-10-CM | POA: Diagnosis not present

## 2014-05-29 DIAGNOSIS — F331 Major depressive disorder, recurrent, moderate: Secondary | ICD-10-CM | POA: Diagnosis not present

## 2014-05-30 DIAGNOSIS — N2581 Secondary hyperparathyroidism of renal origin: Secondary | ICD-10-CM | POA: Diagnosis not present

## 2014-05-30 DIAGNOSIS — D631 Anemia in chronic kidney disease: Secondary | ICD-10-CM | POA: Diagnosis not present

## 2014-05-30 DIAGNOSIS — E611 Iron deficiency: Secondary | ICD-10-CM | POA: Diagnosis not present

## 2014-05-30 DIAGNOSIS — Z992 Dependence on renal dialysis: Secondary | ICD-10-CM | POA: Diagnosis not present

## 2014-05-30 DIAGNOSIS — N186 End stage renal disease: Secondary | ICD-10-CM | POA: Diagnosis not present

## 2014-05-31 DIAGNOSIS — Z992 Dependence on renal dialysis: Secondary | ICD-10-CM | POA: Diagnosis not present

## 2014-05-31 DIAGNOSIS — N186 End stage renal disease: Secondary | ICD-10-CM | POA: Diagnosis not present

## 2014-06-01 DIAGNOSIS — N186 End stage renal disease: Secondary | ICD-10-CM | POA: Diagnosis not present

## 2014-06-01 DIAGNOSIS — Z992 Dependence on renal dialysis: Secondary | ICD-10-CM | POA: Diagnosis not present

## 2014-06-01 DIAGNOSIS — D509 Iron deficiency anemia, unspecified: Secondary | ICD-10-CM | POA: Diagnosis not present

## 2014-06-01 DIAGNOSIS — D631 Anemia in chronic kidney disease: Secondary | ICD-10-CM | POA: Diagnosis not present

## 2014-06-01 DIAGNOSIS — R110 Nausea: Secondary | ICD-10-CM | POA: Diagnosis not present

## 2014-06-01 DIAGNOSIS — N2581 Secondary hyperparathyroidism of renal origin: Secondary | ICD-10-CM | POA: Diagnosis not present

## 2014-06-04 DIAGNOSIS — Z992 Dependence on renal dialysis: Secondary | ICD-10-CM | POA: Diagnosis not present

## 2014-06-04 DIAGNOSIS — R110 Nausea: Secondary | ICD-10-CM | POA: Diagnosis not present

## 2014-06-04 DIAGNOSIS — D631 Anemia in chronic kidney disease: Secondary | ICD-10-CM | POA: Diagnosis not present

## 2014-06-04 DIAGNOSIS — N2581 Secondary hyperparathyroidism of renal origin: Secondary | ICD-10-CM | POA: Diagnosis not present

## 2014-06-04 DIAGNOSIS — D509 Iron deficiency anemia, unspecified: Secondary | ICD-10-CM | POA: Diagnosis not present

## 2014-06-04 DIAGNOSIS — N186 End stage renal disease: Secondary | ICD-10-CM | POA: Diagnosis not present

## 2014-06-05 DIAGNOSIS — N186 End stage renal disease: Secondary | ICD-10-CM | POA: Diagnosis not present

## 2014-06-05 DIAGNOSIS — Z992 Dependence on renal dialysis: Secondary | ICD-10-CM | POA: Diagnosis not present

## 2014-06-05 DIAGNOSIS — T82858D Stenosis of vascular prosthetic devices, implants and grafts, subsequent encounter: Secondary | ICD-10-CM | POA: Diagnosis not present

## 2014-06-05 DIAGNOSIS — I871 Compression of vein: Secondary | ICD-10-CM | POA: Diagnosis not present

## 2014-06-06 DIAGNOSIS — D509 Iron deficiency anemia, unspecified: Secondary | ICD-10-CM | POA: Diagnosis not present

## 2014-06-06 DIAGNOSIS — R110 Nausea: Secondary | ICD-10-CM | POA: Diagnosis not present

## 2014-06-06 DIAGNOSIS — Z992 Dependence on renal dialysis: Secondary | ICD-10-CM | POA: Diagnosis not present

## 2014-06-06 DIAGNOSIS — N2581 Secondary hyperparathyroidism of renal origin: Secondary | ICD-10-CM | POA: Diagnosis not present

## 2014-06-06 DIAGNOSIS — N186 End stage renal disease: Secondary | ICD-10-CM | POA: Diagnosis not present

## 2014-06-06 DIAGNOSIS — D631 Anemia in chronic kidney disease: Secondary | ICD-10-CM | POA: Diagnosis not present

## 2014-06-07 DIAGNOSIS — G894 Chronic pain syndrome: Secondary | ICD-10-CM | POA: Diagnosis not present

## 2014-06-08 DIAGNOSIS — Z992 Dependence on renal dialysis: Secondary | ICD-10-CM | POA: Diagnosis not present

## 2014-06-08 DIAGNOSIS — D509 Iron deficiency anemia, unspecified: Secondary | ICD-10-CM | POA: Diagnosis not present

## 2014-06-08 DIAGNOSIS — R110 Nausea: Secondary | ICD-10-CM | POA: Diagnosis not present

## 2014-06-08 DIAGNOSIS — D631 Anemia in chronic kidney disease: Secondary | ICD-10-CM | POA: Diagnosis not present

## 2014-06-08 DIAGNOSIS — N2581 Secondary hyperparathyroidism of renal origin: Secondary | ICD-10-CM | POA: Diagnosis not present

## 2014-06-08 DIAGNOSIS — N186 End stage renal disease: Secondary | ICD-10-CM | POA: Diagnosis not present

## 2014-06-11 DIAGNOSIS — N186 End stage renal disease: Secondary | ICD-10-CM | POA: Diagnosis not present

## 2014-06-11 DIAGNOSIS — R110 Nausea: Secondary | ICD-10-CM | POA: Diagnosis not present

## 2014-06-11 DIAGNOSIS — N2581 Secondary hyperparathyroidism of renal origin: Secondary | ICD-10-CM | POA: Diagnosis not present

## 2014-06-11 DIAGNOSIS — D631 Anemia in chronic kidney disease: Secondary | ICD-10-CM | POA: Diagnosis not present

## 2014-06-11 DIAGNOSIS — Z992 Dependence on renal dialysis: Secondary | ICD-10-CM | POA: Diagnosis not present

## 2014-06-11 DIAGNOSIS — D509 Iron deficiency anemia, unspecified: Secondary | ICD-10-CM | POA: Diagnosis not present

## 2014-06-13 DIAGNOSIS — D631 Anemia in chronic kidney disease: Secondary | ICD-10-CM | POA: Diagnosis not present

## 2014-06-13 DIAGNOSIS — N2581 Secondary hyperparathyroidism of renal origin: Secondary | ICD-10-CM | POA: Diagnosis not present

## 2014-06-13 DIAGNOSIS — N186 End stage renal disease: Secondary | ICD-10-CM | POA: Diagnosis not present

## 2014-06-13 DIAGNOSIS — Z992 Dependence on renal dialysis: Secondary | ICD-10-CM | POA: Diagnosis not present

## 2014-06-13 DIAGNOSIS — R110 Nausea: Secondary | ICD-10-CM | POA: Diagnosis not present

## 2014-06-13 DIAGNOSIS — D509 Iron deficiency anemia, unspecified: Secondary | ICD-10-CM | POA: Diagnosis not present

## 2014-06-15 DIAGNOSIS — R110 Nausea: Secondary | ICD-10-CM | POA: Diagnosis not present

## 2014-06-15 DIAGNOSIS — Z992 Dependence on renal dialysis: Secondary | ICD-10-CM | POA: Diagnosis not present

## 2014-06-15 DIAGNOSIS — D509 Iron deficiency anemia, unspecified: Secondary | ICD-10-CM | POA: Diagnosis not present

## 2014-06-15 DIAGNOSIS — N186 End stage renal disease: Secondary | ICD-10-CM | POA: Diagnosis not present

## 2014-06-15 DIAGNOSIS — N2581 Secondary hyperparathyroidism of renal origin: Secondary | ICD-10-CM | POA: Diagnosis not present

## 2014-06-15 DIAGNOSIS — D631 Anemia in chronic kidney disease: Secondary | ICD-10-CM | POA: Diagnosis not present

## 2014-06-18 DIAGNOSIS — D509 Iron deficiency anemia, unspecified: Secondary | ICD-10-CM | POA: Diagnosis not present

## 2014-06-18 DIAGNOSIS — N186 End stage renal disease: Secondary | ICD-10-CM | POA: Diagnosis not present

## 2014-06-18 DIAGNOSIS — Z992 Dependence on renal dialysis: Secondary | ICD-10-CM | POA: Diagnosis not present

## 2014-06-18 DIAGNOSIS — N2581 Secondary hyperparathyroidism of renal origin: Secondary | ICD-10-CM | POA: Diagnosis not present

## 2014-06-18 DIAGNOSIS — R110 Nausea: Secondary | ICD-10-CM | POA: Diagnosis not present

## 2014-06-18 DIAGNOSIS — D631 Anemia in chronic kidney disease: Secondary | ICD-10-CM | POA: Diagnosis not present

## 2014-06-20 DIAGNOSIS — D631 Anemia in chronic kidney disease: Secondary | ICD-10-CM | POA: Diagnosis not present

## 2014-06-20 DIAGNOSIS — N186 End stage renal disease: Secondary | ICD-10-CM | POA: Diagnosis not present

## 2014-06-20 DIAGNOSIS — Z992 Dependence on renal dialysis: Secondary | ICD-10-CM | POA: Diagnosis not present

## 2014-06-20 DIAGNOSIS — N2581 Secondary hyperparathyroidism of renal origin: Secondary | ICD-10-CM | POA: Diagnosis not present

## 2014-06-20 DIAGNOSIS — D509 Iron deficiency anemia, unspecified: Secondary | ICD-10-CM | POA: Diagnosis not present

## 2014-06-20 DIAGNOSIS — R110 Nausea: Secondary | ICD-10-CM | POA: Diagnosis not present

## 2014-06-22 DIAGNOSIS — D509 Iron deficiency anemia, unspecified: Secondary | ICD-10-CM | POA: Diagnosis not present

## 2014-06-22 DIAGNOSIS — R110 Nausea: Secondary | ICD-10-CM | POA: Diagnosis not present

## 2014-06-22 DIAGNOSIS — Z992 Dependence on renal dialysis: Secondary | ICD-10-CM | POA: Diagnosis not present

## 2014-06-22 DIAGNOSIS — N2581 Secondary hyperparathyroidism of renal origin: Secondary | ICD-10-CM | POA: Diagnosis not present

## 2014-06-22 DIAGNOSIS — D631 Anemia in chronic kidney disease: Secondary | ICD-10-CM | POA: Diagnosis not present

## 2014-06-22 DIAGNOSIS — N186 End stage renal disease: Secondary | ICD-10-CM | POA: Diagnosis not present

## 2014-06-25 DIAGNOSIS — N2581 Secondary hyperparathyroidism of renal origin: Secondary | ICD-10-CM | POA: Diagnosis not present

## 2014-06-25 DIAGNOSIS — Z992 Dependence on renal dialysis: Secondary | ICD-10-CM | POA: Diagnosis not present

## 2014-06-25 DIAGNOSIS — R110 Nausea: Secondary | ICD-10-CM | POA: Diagnosis not present

## 2014-06-25 DIAGNOSIS — N186 End stage renal disease: Secondary | ICD-10-CM | POA: Diagnosis not present

## 2014-06-25 DIAGNOSIS — D509 Iron deficiency anemia, unspecified: Secondary | ICD-10-CM | POA: Diagnosis not present

## 2014-06-25 DIAGNOSIS — D631 Anemia in chronic kidney disease: Secondary | ICD-10-CM | POA: Diagnosis not present

## 2014-06-26 DIAGNOSIS — F411 Generalized anxiety disorder: Secondary | ICD-10-CM | POA: Diagnosis not present

## 2014-06-26 DIAGNOSIS — F331 Major depressive disorder, recurrent, moderate: Secondary | ICD-10-CM | POA: Diagnosis not present

## 2014-06-27 ENCOUNTER — Other Ambulatory Visit: Payer: Self-pay

## 2014-06-27 DIAGNOSIS — Z992 Dependence on renal dialysis: Secondary | ICD-10-CM | POA: Diagnosis not present

## 2014-06-27 DIAGNOSIS — D509 Iron deficiency anemia, unspecified: Secondary | ICD-10-CM | POA: Diagnosis not present

## 2014-06-27 DIAGNOSIS — N186 End stage renal disease: Secondary | ICD-10-CM | POA: Diagnosis not present

## 2014-06-27 DIAGNOSIS — N2581 Secondary hyperparathyroidism of renal origin: Secondary | ICD-10-CM | POA: Diagnosis not present

## 2014-06-27 DIAGNOSIS — R110 Nausea: Secondary | ICD-10-CM | POA: Diagnosis not present

## 2014-06-27 DIAGNOSIS — D631 Anemia in chronic kidney disease: Secondary | ICD-10-CM | POA: Diagnosis not present

## 2014-06-27 MED ORDER — FENTANYL 50 MCG/HR TD PT72: MEDICATED_PATCH | TRANSDERMAL | Status: DC

## 2014-06-27 NOTE — Telephone Encounter (Signed)
Rx faxed to [facility] @ [phone], phone number [phone]  

## 2014-06-29 DIAGNOSIS — R110 Nausea: Secondary | ICD-10-CM | POA: Diagnosis not present

## 2014-06-29 DIAGNOSIS — N186 End stage renal disease: Secondary | ICD-10-CM | POA: Diagnosis not present

## 2014-06-29 DIAGNOSIS — D631 Anemia in chronic kidney disease: Secondary | ICD-10-CM | POA: Diagnosis not present

## 2014-06-29 DIAGNOSIS — D509 Iron deficiency anemia, unspecified: Secondary | ICD-10-CM | POA: Diagnosis not present

## 2014-06-29 DIAGNOSIS — N2581 Secondary hyperparathyroidism of renal origin: Secondary | ICD-10-CM | POA: Diagnosis not present

## 2014-06-29 DIAGNOSIS — Z992 Dependence on renal dialysis: Secondary | ICD-10-CM | POA: Diagnosis not present

## 2014-06-30 DIAGNOSIS — N186 End stage renal disease: Secondary | ICD-10-CM | POA: Diagnosis not present

## 2014-06-30 DIAGNOSIS — Z992 Dependence on renal dialysis: Secondary | ICD-10-CM | POA: Diagnosis not present

## 2014-07-02 DIAGNOSIS — D509 Iron deficiency anemia, unspecified: Secondary | ICD-10-CM | POA: Diagnosis not present

## 2014-07-02 DIAGNOSIS — Z992 Dependence on renal dialysis: Secondary | ICD-10-CM | POA: Diagnosis not present

## 2014-07-02 DIAGNOSIS — D631 Anemia in chronic kidney disease: Secondary | ICD-10-CM | POA: Diagnosis not present

## 2014-07-02 DIAGNOSIS — N2581 Secondary hyperparathyroidism of renal origin: Secondary | ICD-10-CM | POA: Diagnosis not present

## 2014-07-02 DIAGNOSIS — N186 End stage renal disease: Secondary | ICD-10-CM | POA: Diagnosis not present

## 2014-07-04 DIAGNOSIS — N186 End stage renal disease: Secondary | ICD-10-CM | POA: Diagnosis not present

## 2014-07-04 DIAGNOSIS — D631 Anemia in chronic kidney disease: Secondary | ICD-10-CM | POA: Diagnosis not present

## 2014-07-04 DIAGNOSIS — N2581 Secondary hyperparathyroidism of renal origin: Secondary | ICD-10-CM | POA: Diagnosis not present

## 2014-07-04 DIAGNOSIS — Z992 Dependence on renal dialysis: Secondary | ICD-10-CM | POA: Diagnosis not present

## 2014-07-04 DIAGNOSIS — D509 Iron deficiency anemia, unspecified: Secondary | ICD-10-CM | POA: Diagnosis not present

## 2014-07-05 DIAGNOSIS — G894 Chronic pain syndrome: Secondary | ICD-10-CM | POA: Diagnosis not present

## 2014-07-09 ENCOUNTER — Other Ambulatory Visit: Payer: Self-pay | Admitting: *Deleted

## 2014-07-09 DIAGNOSIS — D509 Iron deficiency anemia, unspecified: Secondary | ICD-10-CM | POA: Diagnosis not present

## 2014-07-09 DIAGNOSIS — N2581 Secondary hyperparathyroidism of renal origin: Secondary | ICD-10-CM | POA: Diagnosis not present

## 2014-07-09 DIAGNOSIS — D631 Anemia in chronic kidney disease: Secondary | ICD-10-CM | POA: Diagnosis not present

## 2014-07-09 DIAGNOSIS — N186 End stage renal disease: Secondary | ICD-10-CM | POA: Diagnosis not present

## 2014-07-09 DIAGNOSIS — Z992 Dependence on renal dialysis: Secondary | ICD-10-CM | POA: Diagnosis not present

## 2014-07-09 MED ORDER — ZALEPLON 5 MG PO CAPS: ORAL_CAPSULE | ORAL | Status: DC

## 2014-07-09 NOTE — Telephone Encounter (Signed)
[facility] 

## 2014-07-11 DIAGNOSIS — D631 Anemia in chronic kidney disease: Secondary | ICD-10-CM | POA: Diagnosis not present

## 2014-07-11 DIAGNOSIS — Z992 Dependence on renal dialysis: Secondary | ICD-10-CM | POA: Diagnosis not present

## 2014-07-11 DIAGNOSIS — N2581 Secondary hyperparathyroidism of renal origin: Secondary | ICD-10-CM | POA: Diagnosis not present

## 2014-07-11 DIAGNOSIS — D509 Iron deficiency anemia, unspecified: Secondary | ICD-10-CM | POA: Diagnosis not present

## 2014-07-11 DIAGNOSIS — N186 End stage renal disease: Secondary | ICD-10-CM | POA: Diagnosis not present

## 2014-07-16 ENCOUNTER — Non-Acute Institutional Stay (SKILLED_NURSING_FACILITY): Payer: Medicare Other | Admitting: Internal Medicine

## 2014-07-16 DIAGNOSIS — Z992 Dependence on renal dialysis: Secondary | ICD-10-CM | POA: Diagnosis not present

## 2014-07-16 DIAGNOSIS — I82B11 Acute embolism and thrombosis of right subclavian vein: Secondary | ICD-10-CM | POA: Diagnosis not present

## 2014-07-16 DIAGNOSIS — N2581 Secondary hyperparathyroidism of renal origin: Secondary | ICD-10-CM | POA: Diagnosis not present

## 2014-07-16 DIAGNOSIS — R609 Edema, unspecified: Secondary | ICD-10-CM

## 2014-07-16 DIAGNOSIS — D631 Anemia in chronic kidney disease: Secondary | ICD-10-CM | POA: Diagnosis not present

## 2014-07-16 DIAGNOSIS — N186 End stage renal disease: Secondary | ICD-10-CM | POA: Diagnosis not present

## 2014-07-16 DIAGNOSIS — R600 Localized edema: Secondary | ICD-10-CM

## 2014-07-16 DIAGNOSIS — D509 Iron deficiency anemia, unspecified: Secondary | ICD-10-CM | POA: Diagnosis not present

## 2014-07-16 NOTE — Progress Notes (Signed)
Patient ID: [name], female   DOB: 01/31/1951, 64 y.o.   MRN: [id number]     Facility; [name] Chief complaint right arm edema/right breast edema History; I was called last week to report the fact that this lady had increasing edema of her right arm. This is her shunt arm where she receives dialysis. I asked that she was seen by vein and vascular however the facility reports they're having trouble getting an appointment. Looking back through her history and it would appear that I first toe with this problem in July 2015. At that point in time she had the edema of the right arm right breast and her face as well. CT angiography revealed the tandem stenosis in the central right subclavian vein and right brachiocephalic vein. She was referred to vein and vascular who I believe did angioplasty on both of these areas. Subsequently the patient was admitted to hospital her right upper extremity swelling and pain in January 2016. She was noted to have subcutaneous edema of the right arm. There was no DVT on duplex ultrasound. She was treated empirically for cellulitis. She was readmitted to hospital from 1/24 through 1/28. Vascular surgery took her to the OR on 1/25 for a fistulogram and venoplasty. She did receive a 2 week history of IV vancomycin at dialysis. During the hospitalization in January the patient had venoplasty of the subclavian, innominate and superior vena cava. The history is that the arm is been gradually increasing and swelling over the last week or. She says she finds the swelling uncomfortable but there is no overt pain  On examination Gen. the patient does not look in any distress. Respiratory clear entry bilaterally Lymph none palpable in the right cervical supra or infraclavicular or right axilla Right arm; indeed this is swollen right up into the upper right arm up. There is some warmth but no overt tenderness. Her shunt appears to be functioning normally. There are  prominent external jugular veins bilaterally Breasts; the right breast is swollen and she has peau d'orange edema in the inferior part of the breast. I don't believe this is new for her in fact we had the breast biopsy 2 years ago and she had ultrasounds that did not show evidence of malignancy  Impression/plan #1 I think the clinical situation is likely recurrent venous stenosis. In the last note from Dr. Bridgett Larsson he makes reference to the fact that this would likely be recurrent. I think she needs to be referred back to vein and vascular. I will see if I can Expedia 8 this. While looking at this arm for the first time it would be easy to wonder about a DVT and/or cellulitis. My level of suspicion about these issues are not all that high given my pre-previous experience with this. She has had several duplex ultrasounds over the last 9 months that are negative for DVT. #2 right breast edema with lymphedema inferiorly. There is no palpable mass here no right axillary adenopathy. Previous workup and follow-up of this did not show any suspicious areas  Overall I'll try to make contact with pain and vascular to see what needs to be done here height think she will need a venogram again. I don't know that she needs to go down to the office necessarily although try to make contact with him either today or tomorrow morning

## 2014-07-18 DIAGNOSIS — D631 Anemia in chronic kidney disease: Secondary | ICD-10-CM | POA: Diagnosis not present

## 2014-07-18 DIAGNOSIS — D509 Iron deficiency anemia, unspecified: Secondary | ICD-10-CM | POA: Diagnosis not present

## 2014-07-18 DIAGNOSIS — N186 End stage renal disease: Secondary | ICD-10-CM | POA: Diagnosis not present

## 2014-07-18 DIAGNOSIS — N2581 Secondary hyperparathyroidism of renal origin: Secondary | ICD-10-CM | POA: Diagnosis not present

## 2014-07-18 DIAGNOSIS — Z992 Dependence on renal dialysis: Secondary | ICD-10-CM | POA: Diagnosis not present

## 2014-07-20 DIAGNOSIS — Z992 Dependence on renal dialysis: Secondary | ICD-10-CM | POA: Diagnosis not present

## 2014-07-20 DIAGNOSIS — D509 Iron deficiency anemia, unspecified: Secondary | ICD-10-CM | POA: Diagnosis not present

## 2014-07-20 DIAGNOSIS — D631 Anemia in chronic kidney disease: Secondary | ICD-10-CM | POA: Diagnosis not present

## 2014-07-20 DIAGNOSIS — N2581 Secondary hyperparathyroidism of renal origin: Secondary | ICD-10-CM | POA: Diagnosis not present

## 2014-07-20 DIAGNOSIS — N186 End stage renal disease: Secondary | ICD-10-CM | POA: Diagnosis not present

## 2014-07-23 DIAGNOSIS — D509 Iron deficiency anemia, unspecified: Secondary | ICD-10-CM | POA: Diagnosis not present

## 2014-07-23 DIAGNOSIS — N2581 Secondary hyperparathyroidism of renal origin: Secondary | ICD-10-CM | POA: Diagnosis not present

## 2014-07-23 DIAGNOSIS — D631 Anemia in chronic kidney disease: Secondary | ICD-10-CM | POA: Diagnosis not present

## 2014-07-23 DIAGNOSIS — Z992 Dependence on renal dialysis: Secondary | ICD-10-CM | POA: Diagnosis not present

## 2014-07-23 DIAGNOSIS — N186 End stage renal disease: Secondary | ICD-10-CM | POA: Diagnosis not present

## 2014-07-24 DIAGNOSIS — Z992 Dependence on renal dialysis: Secondary | ICD-10-CM | POA: Diagnosis not present

## 2014-07-24 DIAGNOSIS — I871 Compression of vein: Secondary | ICD-10-CM | POA: Diagnosis not present

## 2014-07-24 DIAGNOSIS — T82858D Stenosis of vascular prosthetic devices, implants and grafts, subsequent encounter: Secondary | ICD-10-CM | POA: Diagnosis not present

## 2014-07-24 DIAGNOSIS — N186 End stage renal disease: Secondary | ICD-10-CM | POA: Diagnosis not present

## 2014-07-25 DIAGNOSIS — N186 End stage renal disease: Secondary | ICD-10-CM | POA: Diagnosis not present

## 2014-07-25 DIAGNOSIS — D631 Anemia in chronic kidney disease: Secondary | ICD-10-CM | POA: Diagnosis not present

## 2014-07-25 DIAGNOSIS — D509 Iron deficiency anemia, unspecified: Secondary | ICD-10-CM | POA: Diagnosis not present

## 2014-07-25 DIAGNOSIS — N2581 Secondary hyperparathyroidism of renal origin: Secondary | ICD-10-CM | POA: Diagnosis not present

## 2014-07-25 DIAGNOSIS — Z992 Dependence on renal dialysis: Secondary | ICD-10-CM | POA: Diagnosis not present

## 2014-07-26 ENCOUNTER — Other Ambulatory Visit (HOSPITAL_COMMUNITY): Payer: Self-pay | Admitting: Nephrology

## 2014-07-26 ENCOUNTER — Other Ambulatory Visit: Payer: Self-pay | Admitting: Radiology

## 2014-07-26 DIAGNOSIS — T82858A Stenosis of vascular prosthetic devices, implants and grafts, initial encounter: Secondary | ICD-10-CM

## 2014-07-27 ENCOUNTER — Ambulatory Visit (HOSPITAL_COMMUNITY): Payer: Medicare Other

## 2014-07-30 DIAGNOSIS — Z992 Dependence on renal dialysis: Secondary | ICD-10-CM | POA: Diagnosis not present

## 2014-07-30 DIAGNOSIS — N186 End stage renal disease: Secondary | ICD-10-CM | POA: Diagnosis not present

## 2014-07-30 DIAGNOSIS — N2581 Secondary hyperparathyroidism of renal origin: Secondary | ICD-10-CM | POA: Diagnosis not present

## 2014-07-30 DIAGNOSIS — D509 Iron deficiency anemia, unspecified: Secondary | ICD-10-CM | POA: Diagnosis not present

## 2014-07-30 DIAGNOSIS — D631 Anemia in chronic kidney disease: Secondary | ICD-10-CM | POA: Diagnosis not present

## 2014-07-31 ENCOUNTER — Ambulatory Visit (HOSPITAL_COMMUNITY)
Admission: RE | Admit: 2014-07-31 | Discharge: 2014-07-31 | Disposition: A | Discharge status: Home / Self Care | Payer: Medicare Other | Source: Ambulatory Visit | Attending: Nephrology | Admitting: Nephrology

## 2014-07-31 DIAGNOSIS — Z4901 Encounter for fitting and adjustment of extracorporeal dialysis catheter: Secondary | ICD-10-CM | POA: Insufficient documentation

## 2014-07-31 DIAGNOSIS — Z992 Dependence on renal dialysis: Secondary | ICD-10-CM | POA: Diagnosis not present

## 2014-07-31 DIAGNOSIS — N186 End stage renal disease: Secondary | ICD-10-CM | POA: Diagnosis not present

## 2014-07-31 DIAGNOSIS — M7989 Other specified soft tissue disorders: Secondary | ICD-10-CM | POA: Diagnosis not present

## 2014-07-31 DIAGNOSIS — R2231 Localized swelling, mass and lump, right upper limb: Secondary | ICD-10-CM | POA: Diagnosis not present

## 2014-07-31 DIAGNOSIS — T82858A Stenosis of vascular prosthetic devices, implants and grafts, initial encounter: Secondary | ICD-10-CM

## 2014-07-31 IMAGING — XA IR SHUNTOGRAM/ FISTULAGRAM *R*
1 series · 13 of 24 positions shown · non-contrast
Comparison: [DATE]

CLINICAL DATA: History of right upper extremity swelling. End-stage
renal disease with right forearm fistula.

EXAM:
RIGHT UPPER EXTREMITY FISTULOGRAM

[Series 1: run · 13 of 28 slices shown]
[im 1/28]
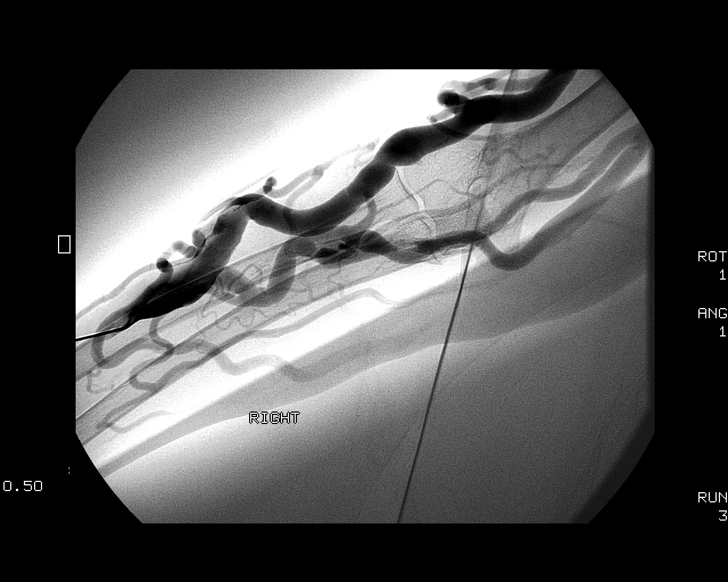
[im 3/28]
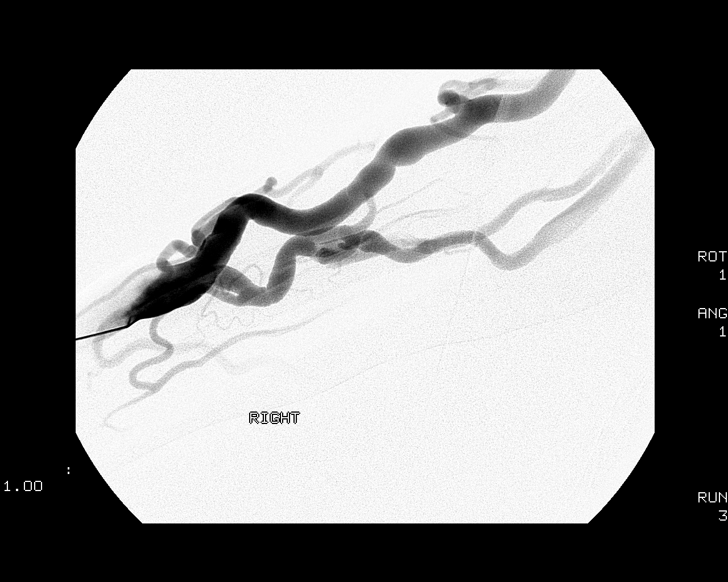
[im 5/28]
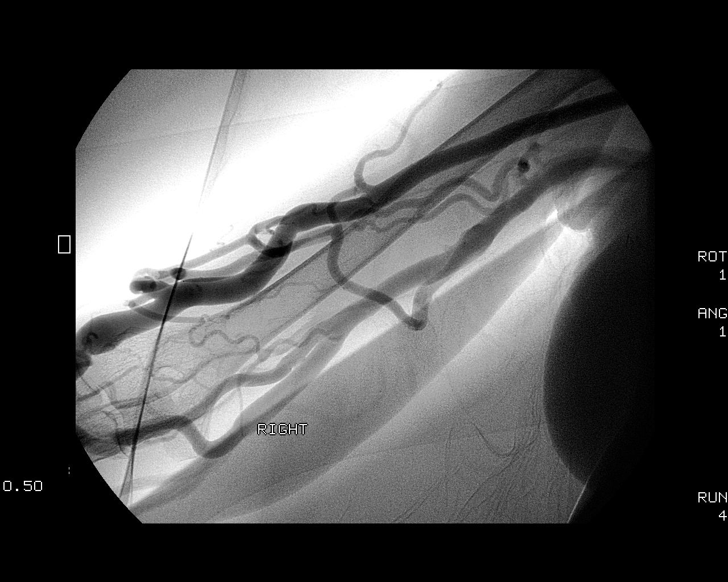
[im 8/28]
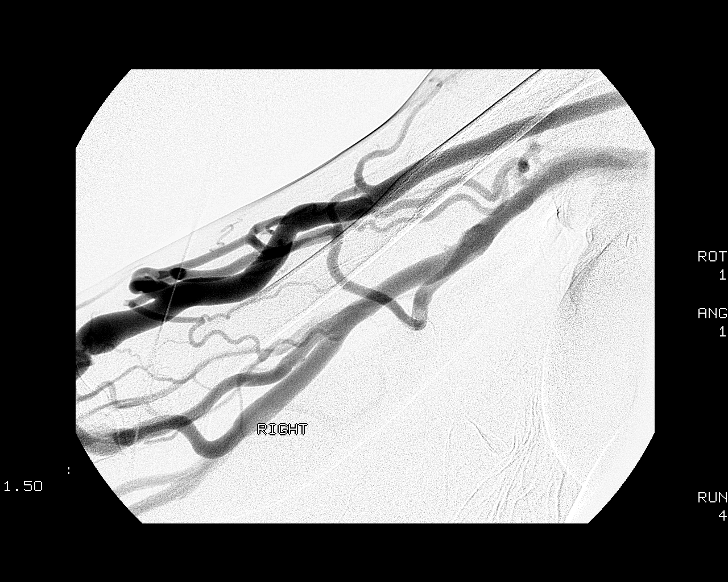
[im 10/28]
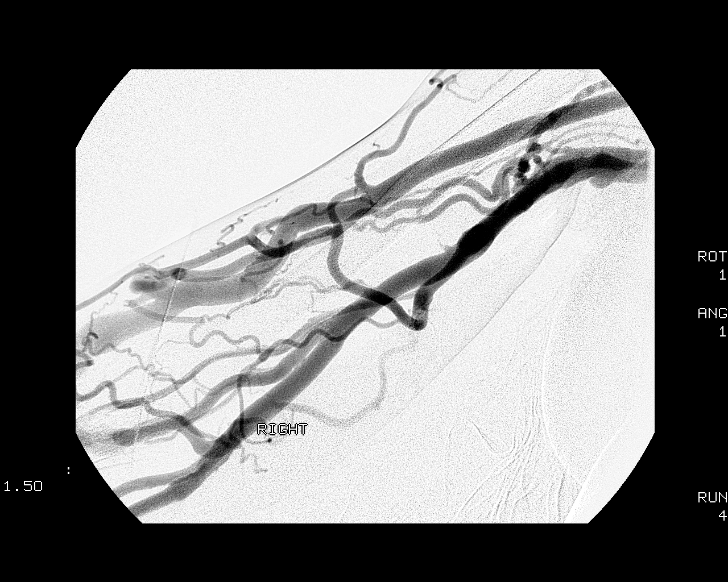
[im 12/28]
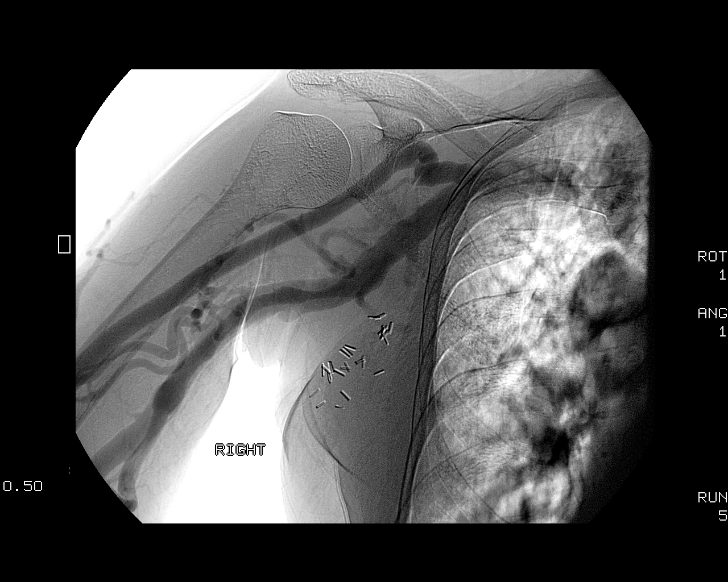
[im 15/28]
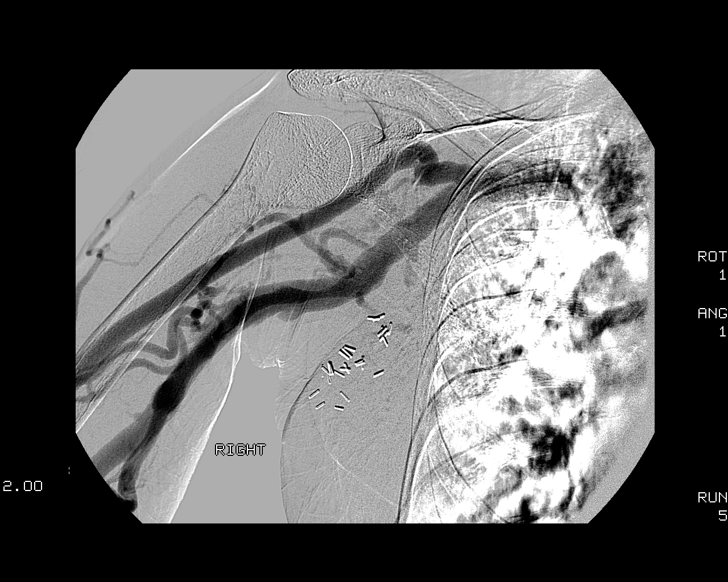
[im 16/28]
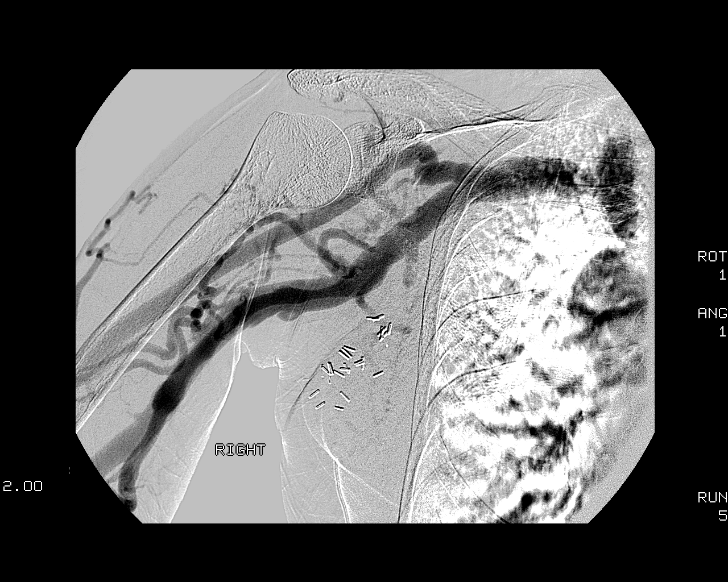
[im 18/28]
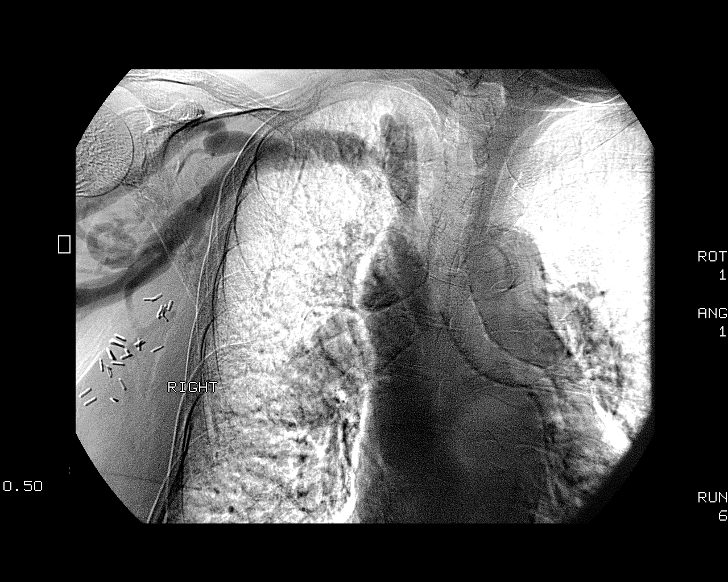
[im 20/28]
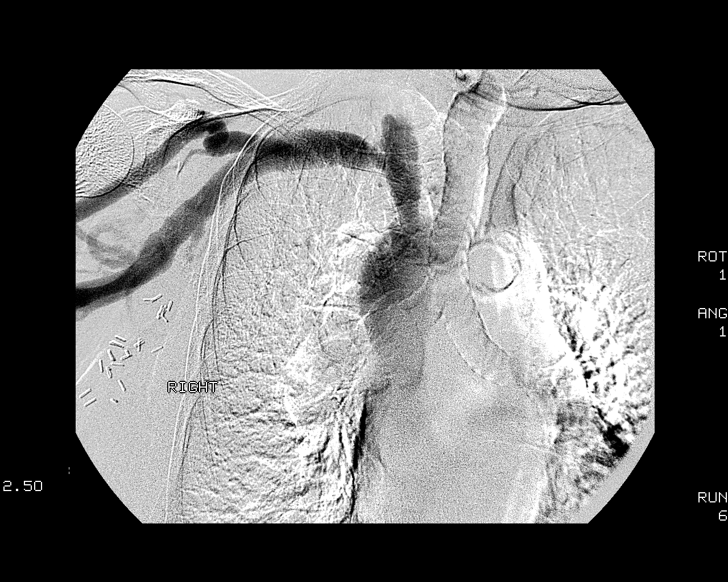
[im 23/28]
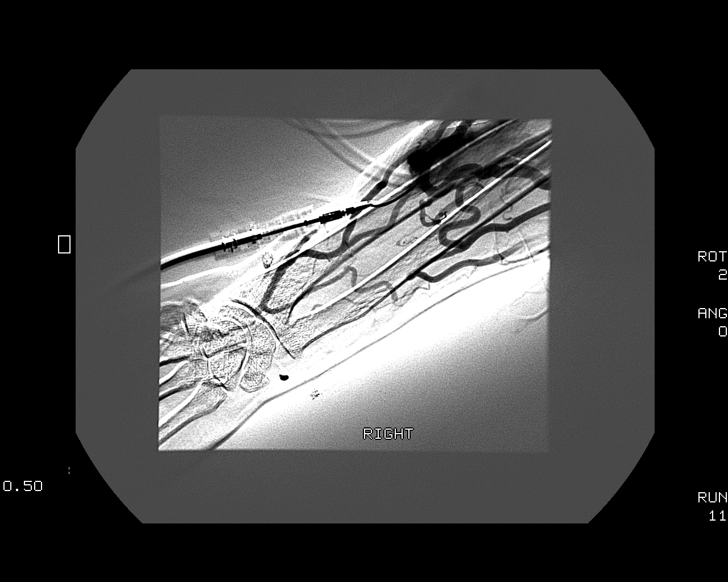
[im 25/28]
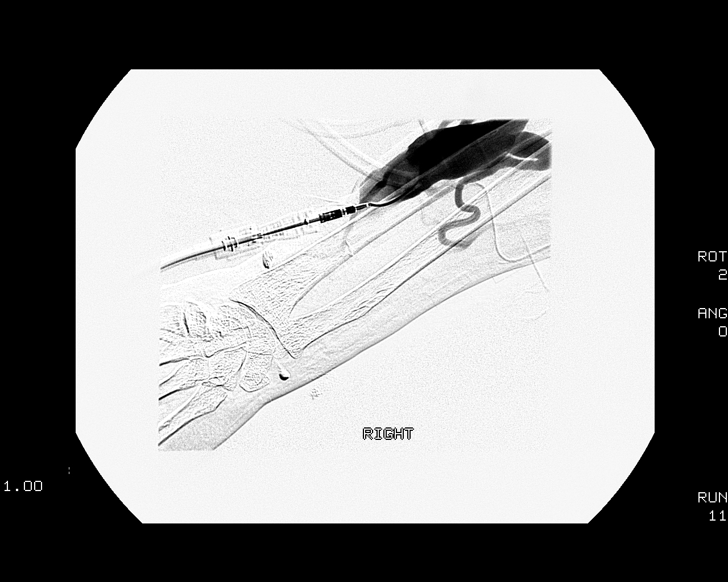
[im 28/28]
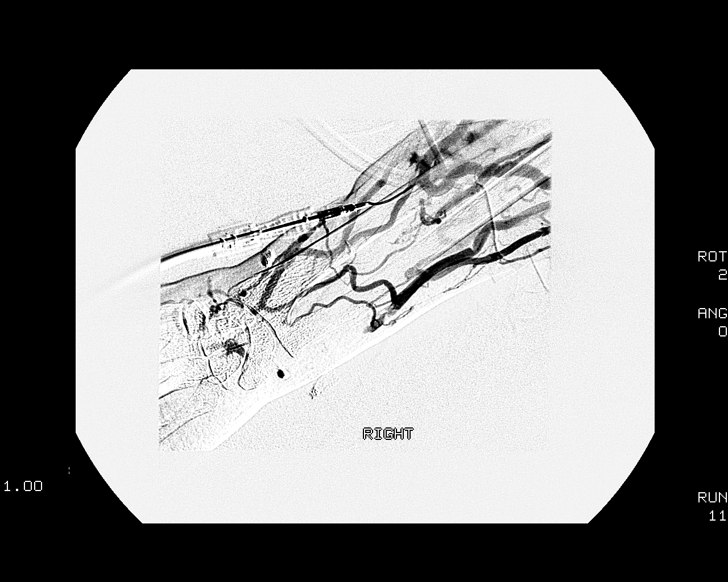

[13 of 24 positions shown; findings below may reference images not displayed]

FLUOROSCOPY TIME:  1 minutes and 48 seconds, 98.7 mGy

MEDICATIONS AND MEDICAL HISTORY:
None

CONTRAST:  75 mL Omnipaque 300

ANESTHESIA/SEDATION:
Moderate sedation time: None

PROCEDURE:
The right forearm fistula was accessed with an Angiocath. A series
of fistulogram images were obtained. Multiple attempts were made to
reflux contrast to the arterial anastomosis. The catheter was
removed with manual compression. Bandage placed over the puncture
site.
FINDINGS: Patient has a right forearm fistula. The fistula is widely patent
but there are innumerable collateral vessels coming off the main
draining vein. The right subclavian vein, right brachiocephalic vein
and SVC are patent without critical stenosis. No significant
collaterals identified in the right upper chest. Due to the large
number of collateral vessels in the right arm, contrast was unable
to be successfully refluxed into the artery. However, the patient
has a strong pulse in the fistula.

Estimated blood loss: Minimal
IMPRESSION: Patent right upper extremity fistula with innumerable collateral
vessels in the right arm. There is no significant stenosis or
occlusion identified.

Contrast was unable to be refluxed into the artery and this is
likely related to the large number of collaterals in the forearm and
right upper arm. Patient has a strong pulse in the fistula
suggesting that there is no significant stenosis at the arterial
anastomosis.

## 2014-07-31 MED ORDER — IOHEXOL 300 MG/ML  SOLN
100.0000 mL | Freq: Once | INTRAMUSCULAR | Status: AC | PRN
  Administered 2014-07-31: 75 mL via INTRAVENOUS

## 2014-08-03 DIAGNOSIS — N2581 Secondary hyperparathyroidism of renal origin: Secondary | ICD-10-CM | POA: Diagnosis not present

## 2014-08-03 DIAGNOSIS — Z992 Dependence on renal dialysis: Secondary | ICD-10-CM | POA: Diagnosis not present

## 2014-08-03 DIAGNOSIS — D631 Anemia in chronic kidney disease: Secondary | ICD-10-CM | POA: Diagnosis not present

## 2014-08-03 DIAGNOSIS — N186 End stage renal disease: Secondary | ICD-10-CM | POA: Diagnosis not present

## 2014-08-03 DIAGNOSIS — D509 Iron deficiency anemia, unspecified: Secondary | ICD-10-CM | POA: Diagnosis not present

## 2014-08-06 DIAGNOSIS — N186 End stage renal disease: Secondary | ICD-10-CM | POA: Diagnosis not present

## 2014-08-06 DIAGNOSIS — N2581 Secondary hyperparathyroidism of renal origin: Secondary | ICD-10-CM | POA: Diagnosis not present

## 2014-08-06 DIAGNOSIS — D631 Anemia in chronic kidney disease: Secondary | ICD-10-CM | POA: Diagnosis not present

## 2014-08-06 DIAGNOSIS — D509 Iron deficiency anemia, unspecified: Secondary | ICD-10-CM | POA: Diagnosis not present

## 2014-08-06 DIAGNOSIS — Z992 Dependence on renal dialysis: Secondary | ICD-10-CM | POA: Diagnosis not present

## 2014-08-08 ENCOUNTER — Other Ambulatory Visit: Payer: Medicare Other

## 2014-08-08 ENCOUNTER — Other Ambulatory Visit: Payer: Self-pay | Admitting: *Deleted

## 2014-08-08 DIAGNOSIS — D631 Anemia in chronic kidney disease: Secondary | ICD-10-CM | POA: Diagnosis not present

## 2014-08-08 DIAGNOSIS — Z79899 Other long term (current) drug therapy: Secondary | ICD-10-CM

## 2014-08-08 DIAGNOSIS — Z113 Encounter for screening for infections with a predominantly sexual mode of transmission: Secondary | ICD-10-CM

## 2014-08-08 DIAGNOSIS — B20 Human immunodeficiency virus [HIV] disease: Secondary | ICD-10-CM

## 2014-08-08 DIAGNOSIS — N2581 Secondary hyperparathyroidism of renal origin: Secondary | ICD-10-CM | POA: Diagnosis not present

## 2014-08-08 DIAGNOSIS — N186 End stage renal disease: Secondary | ICD-10-CM | POA: Diagnosis not present

## 2014-08-08 DIAGNOSIS — D509 Iron deficiency anemia, unspecified: Secondary | ICD-10-CM | POA: Diagnosis not present

## 2014-08-08 DIAGNOSIS — Z992 Dependence on renal dialysis: Secondary | ICD-10-CM | POA: Diagnosis not present

## 2014-08-10 DIAGNOSIS — D631 Anemia in chronic kidney disease: Secondary | ICD-10-CM | POA: Diagnosis not present

## 2014-08-10 DIAGNOSIS — N2581 Secondary hyperparathyroidism of renal origin: Secondary | ICD-10-CM | POA: Diagnosis not present

## 2014-08-10 DIAGNOSIS — N186 End stage renal disease: Secondary | ICD-10-CM | POA: Diagnosis not present

## 2014-08-10 DIAGNOSIS — Z992 Dependence on renal dialysis: Secondary | ICD-10-CM | POA: Diagnosis not present

## 2014-08-10 DIAGNOSIS — D509 Iron deficiency anemia, unspecified: Secondary | ICD-10-CM | POA: Diagnosis not present

## 2014-08-10 DIAGNOSIS — B20 Human immunodeficiency virus [HIV] disease: Secondary | ICD-10-CM | POA: Diagnosis not present

## 2014-08-14 DIAGNOSIS — F331 Major depressive disorder, recurrent, moderate: Secondary | ICD-10-CM | POA: Diagnosis not present

## 2014-08-14 DIAGNOSIS — F411 Generalized anxiety disorder: Secondary | ICD-10-CM | POA: Diagnosis not present

## 2014-08-15 DIAGNOSIS — N2581 Secondary hyperparathyroidism of renal origin: Secondary | ICD-10-CM | POA: Diagnosis not present

## 2014-08-15 DIAGNOSIS — N186 End stage renal disease: Secondary | ICD-10-CM | POA: Diagnosis not present

## 2014-08-15 DIAGNOSIS — D631 Anemia in chronic kidney disease: Secondary | ICD-10-CM | POA: Diagnosis not present

## 2014-08-15 DIAGNOSIS — D509 Iron deficiency anemia, unspecified: Secondary | ICD-10-CM | POA: Diagnosis not present

## 2014-08-15 DIAGNOSIS — Z992 Dependence on renal dialysis: Secondary | ICD-10-CM | POA: Diagnosis not present

## 2014-08-17 DIAGNOSIS — D509 Iron deficiency anemia, unspecified: Secondary | ICD-10-CM | POA: Diagnosis not present

## 2014-08-17 DIAGNOSIS — N2581 Secondary hyperparathyroidism of renal origin: Secondary | ICD-10-CM | POA: Diagnosis not present

## 2014-08-17 DIAGNOSIS — Z992 Dependence on renal dialysis: Secondary | ICD-10-CM | POA: Diagnosis not present

## 2014-08-17 DIAGNOSIS — N186 End stage renal disease: Secondary | ICD-10-CM | POA: Diagnosis not present

## 2014-08-17 DIAGNOSIS — D631 Anemia in chronic kidney disease: Secondary | ICD-10-CM | POA: Diagnosis not present

## 2014-08-20 DIAGNOSIS — D509 Iron deficiency anemia, unspecified: Secondary | ICD-10-CM | POA: Diagnosis not present

## 2014-08-20 DIAGNOSIS — N186 End stage renal disease: Secondary | ICD-10-CM | POA: Diagnosis not present

## 2014-08-20 DIAGNOSIS — Z992 Dependence on renal dialysis: Secondary | ICD-10-CM | POA: Diagnosis not present

## 2014-08-20 DIAGNOSIS — N2581 Secondary hyperparathyroidism of renal origin: Secondary | ICD-10-CM | POA: Diagnosis not present

## 2014-08-20 DIAGNOSIS — D631 Anemia in chronic kidney disease: Secondary | ICD-10-CM | POA: Diagnosis not present

## 2014-08-22 ENCOUNTER — Ambulatory Visit: Payer: Medicare Other | Admitting: Infectious Diseases

## 2014-08-22 DIAGNOSIS — N186 End stage renal disease: Secondary | ICD-10-CM | POA: Diagnosis not present

## 2014-08-22 DIAGNOSIS — N2581 Secondary hyperparathyroidism of renal origin: Secondary | ICD-10-CM | POA: Diagnosis not present

## 2014-08-22 DIAGNOSIS — Z992 Dependence on renal dialysis: Secondary | ICD-10-CM | POA: Diagnosis not present

## 2014-08-22 DIAGNOSIS — D509 Iron deficiency anemia, unspecified: Secondary | ICD-10-CM | POA: Diagnosis not present

## 2014-08-22 DIAGNOSIS — D631 Anemia in chronic kidney disease: Secondary | ICD-10-CM | POA: Diagnosis not present

## 2014-08-23 DIAGNOSIS — L03113 Cellulitis of right upper limb: Secondary | ICD-10-CM | POA: Diagnosis not present

## 2014-08-24 DIAGNOSIS — D631 Anemia in chronic kidney disease: Secondary | ICD-10-CM | POA: Diagnosis not present

## 2014-08-24 DIAGNOSIS — D509 Iron deficiency anemia, unspecified: Secondary | ICD-10-CM | POA: Diagnosis not present

## 2014-08-24 DIAGNOSIS — N186 End stage renal disease: Secondary | ICD-10-CM | POA: Diagnosis not present

## 2014-08-24 DIAGNOSIS — Z992 Dependence on renal dialysis: Secondary | ICD-10-CM | POA: Diagnosis not present

## 2014-08-24 DIAGNOSIS — N2581 Secondary hyperparathyroidism of renal origin: Secondary | ICD-10-CM | POA: Diagnosis not present

## 2014-08-27 DIAGNOSIS — N186 End stage renal disease: Secondary | ICD-10-CM | POA: Diagnosis not present

## 2014-08-27 DIAGNOSIS — D509 Iron deficiency anemia, unspecified: Secondary | ICD-10-CM | POA: Diagnosis not present

## 2014-08-27 DIAGNOSIS — N2581 Secondary hyperparathyroidism of renal origin: Secondary | ICD-10-CM | POA: Diagnosis not present

## 2014-08-27 DIAGNOSIS — D631 Anemia in chronic kidney disease: Secondary | ICD-10-CM | POA: Diagnosis not present

## 2014-08-27 DIAGNOSIS — Z992 Dependence on renal dialysis: Secondary | ICD-10-CM | POA: Diagnosis not present

## 2014-08-28 ENCOUNTER — Other Ambulatory Visit: Payer: Self-pay | Admitting: Internal Medicine

## 2014-08-28 DIAGNOSIS — Z1231 Encounter for screening mammogram for malignant neoplasm of breast: Secondary | ICD-10-CM

## 2014-08-28 DIAGNOSIS — L03113 Cellulitis of right upper limb: Secondary | ICD-10-CM | POA: Diagnosis not present

## 2014-08-29 ENCOUNTER — Other Ambulatory Visit: Payer: Self-pay | Admitting: Internal Medicine

## 2014-08-30 ENCOUNTER — Ambulatory Visit (INDEPENDENT_AMBULATORY_CARE_PROVIDER_SITE_OTHER): Payer: Medicare Other | Admitting: Infectious Diseases

## 2014-08-30 ENCOUNTER — Encounter: Payer: Self-pay | Admitting: Infectious Diseases

## 2014-08-30 VITALS — BP 183/123 | HR 66 | Temp 98.3°F | Ht 62.0 in | Wt 111.0 lb

## 2014-08-30 DIAGNOSIS — I10 Essential (primary) hypertension: Secondary | ICD-10-CM | POA: Diagnosis present

## 2014-08-30 DIAGNOSIS — N186 End stage renal disease: Secondary | ICD-10-CM

## 2014-08-30 DIAGNOSIS — Z992 Dependence on renal dialysis: Secondary | ICD-10-CM

## 2014-08-30 DIAGNOSIS — B20 Human immunodeficiency virus [HIV] disease: Secondary | ICD-10-CM

## 2014-08-30 DIAGNOSIS — L03113 Cellulitis of right upper limb: Secondary | ICD-10-CM | POA: Diagnosis not present

## 2014-08-30 NOTE — Assessment & Plan Note (Signed)
She is being follwed by nephrology. Will defer med change to them.

## 2014-08-30 NOTE — Progress Notes (Signed)
   Subjective:    Patient ID: [name], female    DOB: November 30, 1950, 64 y.o.   MRN: [id number]  HPI 64 yo F with HIV+, ESRD, staying at SNF. She is on ISN/AZT/3TC dosed for her HD. Is staying in assisted living facility. Had labs done there- CD4 244, VL < 20 (08-10-14)  Today complains of muscle pain. Has had d PT to try to improve this.  Has lost 10# since last visit, is unsure why.  Has been on HD for 6-7 years.  Doesn't like food where she is, gets nauseated easily, occas emesis.   Review of Systems  Constitutional: Positive for unexpected weight change. Negative for appetite change.  Cardiovascular: Negative for leg swelling.  Gastrointestinal: Negative for diarrhea and constipation.  Genitourinary: Negative for difficulty urinating.       Objective:   Physical Exam  Constitutional: She appears well-developed and well-nourished.  HENT:  Mouth/Throat: No oropharyngeal exudate.  Eyes: EOM are normal. Pupils are equal, round, and reactive to light.  Neck: Neck supple.  Cardiovascular: Normal rate, regular rhythm and normal heart sounds.   Pulmonary/Chest: Effort normal and breath sounds normal.  Abdominal: Soft. Bowel sounds are normal. She exhibits no distension. There is no tenderness.  Musculoskeletal:       Arms: Lymphadenopathy:    She has no cervical adenopathy.      Assessment & Plan:

## 2014-08-30 NOTE — Assessment & Plan Note (Signed)
Greatly appreciate renal f/u.  Will defer to them as far as what is an appropriate nutritional supplement (nepro?).

## 2014-08-30 NOTE — Assessment & Plan Note (Signed)
She is doing very well.  Will continue her current art.  Offered/refused condoms.  vax are up to date.  Labs at SNF See her back in 6 months.

## 2014-08-31 DIAGNOSIS — D509 Iron deficiency anemia, unspecified: Secondary | ICD-10-CM | POA: Diagnosis not present

## 2014-08-31 DIAGNOSIS — Z992 Dependence on renal dialysis: Secondary | ICD-10-CM | POA: Diagnosis not present

## 2014-08-31 DIAGNOSIS — D631 Anemia in chronic kidney disease: Secondary | ICD-10-CM | POA: Diagnosis not present

## 2014-08-31 DIAGNOSIS — N186 End stage renal disease: Secondary | ICD-10-CM | POA: Diagnosis not present

## 2014-08-31 DIAGNOSIS — N2581 Secondary hyperparathyroidism of renal origin: Secondary | ICD-10-CM | POA: Diagnosis not present

## 2014-09-03 DIAGNOSIS — N186 End stage renal disease: Secondary | ICD-10-CM | POA: Diagnosis not present

## 2014-09-03 DIAGNOSIS — N2581 Secondary hyperparathyroidism of renal origin: Secondary | ICD-10-CM | POA: Diagnosis not present

## 2014-09-03 DIAGNOSIS — D631 Anemia in chronic kidney disease: Secondary | ICD-10-CM | POA: Diagnosis not present

## 2014-09-03 DIAGNOSIS — Z992 Dependence on renal dialysis: Secondary | ICD-10-CM | POA: Diagnosis not present

## 2014-09-03 DIAGNOSIS — D509 Iron deficiency anemia, unspecified: Secondary | ICD-10-CM | POA: Diagnosis not present

## 2014-09-04 DIAGNOSIS — L03113 Cellulitis of right upper limb: Secondary | ICD-10-CM | POA: Diagnosis not present

## 2014-09-05 DIAGNOSIS — N186 End stage renal disease: Secondary | ICD-10-CM | POA: Diagnosis not present

## 2014-09-05 DIAGNOSIS — D631 Anemia in chronic kidney disease: Secondary | ICD-10-CM | POA: Diagnosis not present

## 2014-09-05 DIAGNOSIS — D509 Iron deficiency anemia, unspecified: Secondary | ICD-10-CM | POA: Diagnosis not present

## 2014-09-05 DIAGNOSIS — Z992 Dependence on renal dialysis: Secondary | ICD-10-CM | POA: Diagnosis not present

## 2014-09-05 DIAGNOSIS — N2581 Secondary hyperparathyroidism of renal origin: Secondary | ICD-10-CM | POA: Diagnosis not present

## 2014-09-06 DIAGNOSIS — L03113 Cellulitis of right upper limb: Secondary | ICD-10-CM | POA: Diagnosis not present

## 2014-09-07 DIAGNOSIS — D509 Iron deficiency anemia, unspecified: Secondary | ICD-10-CM | POA: Diagnosis not present

## 2014-09-07 DIAGNOSIS — D631 Anemia in chronic kidney disease: Secondary | ICD-10-CM | POA: Diagnosis not present

## 2014-09-07 DIAGNOSIS — N186 End stage renal disease: Secondary | ICD-10-CM | POA: Diagnosis not present

## 2014-09-07 DIAGNOSIS — N2581 Secondary hyperparathyroidism of renal origin: Secondary | ICD-10-CM | POA: Diagnosis not present

## 2014-09-07 DIAGNOSIS — L03113 Cellulitis of right upper limb: Secondary | ICD-10-CM | POA: Diagnosis not present

## 2014-09-07 DIAGNOSIS — Z992 Dependence on renal dialysis: Secondary | ICD-10-CM | POA: Diagnosis not present

## 2014-09-10 DIAGNOSIS — L03113 Cellulitis of right upper limb: Secondary | ICD-10-CM | POA: Diagnosis not present

## 2014-09-11 DIAGNOSIS — L03113 Cellulitis of right upper limb: Secondary | ICD-10-CM | POA: Diagnosis not present

## 2014-09-11 DIAGNOSIS — G894 Chronic pain syndrome: Secondary | ICD-10-CM | POA: Diagnosis not present

## 2014-09-12 DIAGNOSIS — Z992 Dependence on renal dialysis: Secondary | ICD-10-CM | POA: Diagnosis not present

## 2014-09-12 DIAGNOSIS — L03113 Cellulitis of right upper limb: Secondary | ICD-10-CM | POA: Diagnosis not present

## 2014-09-12 DIAGNOSIS — D631 Anemia in chronic kidney disease: Secondary | ICD-10-CM | POA: Diagnosis not present

## 2014-09-12 DIAGNOSIS — N2581 Secondary hyperparathyroidism of renal origin: Secondary | ICD-10-CM | POA: Diagnosis not present

## 2014-09-12 DIAGNOSIS — D509 Iron deficiency anemia, unspecified: Secondary | ICD-10-CM | POA: Diagnosis not present

## 2014-09-12 DIAGNOSIS — N186 End stage renal disease: Secondary | ICD-10-CM | POA: Diagnosis not present

## 2014-09-14 DIAGNOSIS — Z992 Dependence on renal dialysis: Secondary | ICD-10-CM | POA: Diagnosis not present

## 2014-09-14 DIAGNOSIS — N2581 Secondary hyperparathyroidism of renal origin: Secondary | ICD-10-CM | POA: Diagnosis not present

## 2014-09-14 DIAGNOSIS — D509 Iron deficiency anemia, unspecified: Secondary | ICD-10-CM | POA: Diagnosis not present

## 2014-09-14 DIAGNOSIS — D631 Anemia in chronic kidney disease: Secondary | ICD-10-CM | POA: Diagnosis not present

## 2014-09-14 DIAGNOSIS — N186 End stage renal disease: Secondary | ICD-10-CM | POA: Diagnosis not present

## 2014-09-17 DIAGNOSIS — N2581 Secondary hyperparathyroidism of renal origin: Secondary | ICD-10-CM | POA: Diagnosis not present

## 2014-09-17 DIAGNOSIS — D509 Iron deficiency anemia, unspecified: Secondary | ICD-10-CM | POA: Diagnosis not present

## 2014-09-17 DIAGNOSIS — N186 End stage renal disease: Secondary | ICD-10-CM | POA: Diagnosis not present

## 2014-09-17 DIAGNOSIS — Z992 Dependence on renal dialysis: Secondary | ICD-10-CM | POA: Diagnosis not present

## 2014-09-17 DIAGNOSIS — L03113 Cellulitis of right upper limb: Secondary | ICD-10-CM | POA: Diagnosis not present

## 2014-09-17 DIAGNOSIS — D631 Anemia in chronic kidney disease: Secondary | ICD-10-CM | POA: Diagnosis not present

## 2014-09-19 DIAGNOSIS — N2581 Secondary hyperparathyroidism of renal origin: Secondary | ICD-10-CM | POA: Diagnosis not present

## 2014-09-19 DIAGNOSIS — D631 Anemia in chronic kidney disease: Secondary | ICD-10-CM | POA: Diagnosis not present

## 2014-09-19 DIAGNOSIS — N186 End stage renal disease: Secondary | ICD-10-CM | POA: Diagnosis not present

## 2014-09-19 DIAGNOSIS — D509 Iron deficiency anemia, unspecified: Secondary | ICD-10-CM | POA: Diagnosis not present

## 2014-09-19 DIAGNOSIS — Z992 Dependence on renal dialysis: Secondary | ICD-10-CM | POA: Diagnosis not present

## 2014-09-21 DIAGNOSIS — D509 Iron deficiency anemia, unspecified: Secondary | ICD-10-CM | POA: Diagnosis not present

## 2014-09-21 DIAGNOSIS — Z992 Dependence on renal dialysis: Secondary | ICD-10-CM | POA: Diagnosis not present

## 2014-09-21 DIAGNOSIS — N186 End stage renal disease: Secondary | ICD-10-CM | POA: Diagnosis not present

## 2014-09-21 DIAGNOSIS — N2581 Secondary hyperparathyroidism of renal origin: Secondary | ICD-10-CM | POA: Diagnosis not present

## 2014-09-21 DIAGNOSIS — D631 Anemia in chronic kidney disease: Secondary | ICD-10-CM | POA: Diagnosis not present

## 2014-09-24 DIAGNOSIS — D631 Anemia in chronic kidney disease: Secondary | ICD-10-CM | POA: Diagnosis not present

## 2014-09-24 DIAGNOSIS — Z992 Dependence on renal dialysis: Secondary | ICD-10-CM | POA: Diagnosis not present

## 2014-09-24 DIAGNOSIS — D509 Iron deficiency anemia, unspecified: Secondary | ICD-10-CM | POA: Diagnosis not present

## 2014-09-24 DIAGNOSIS — N2581 Secondary hyperparathyroidism of renal origin: Secondary | ICD-10-CM | POA: Diagnosis not present

## 2014-09-24 DIAGNOSIS — N186 End stage renal disease: Secondary | ICD-10-CM | POA: Diagnosis not present

## 2014-09-25 DIAGNOSIS — L03113 Cellulitis of right upper limb: Secondary | ICD-10-CM | POA: Diagnosis not present

## 2014-09-26 DIAGNOSIS — Z992 Dependence on renal dialysis: Secondary | ICD-10-CM | POA: Diagnosis not present

## 2014-09-26 DIAGNOSIS — N2581 Secondary hyperparathyroidism of renal origin: Secondary | ICD-10-CM | POA: Diagnosis not present

## 2014-09-26 DIAGNOSIS — D631 Anemia in chronic kidney disease: Secondary | ICD-10-CM | POA: Diagnosis not present

## 2014-09-26 DIAGNOSIS — N186 End stage renal disease: Secondary | ICD-10-CM | POA: Diagnosis not present

## 2014-09-26 DIAGNOSIS — D509 Iron deficiency anemia, unspecified: Secondary | ICD-10-CM | POA: Diagnosis not present

## 2014-09-27 DIAGNOSIS — L03113 Cellulitis of right upper limb: Secondary | ICD-10-CM | POA: Diagnosis not present

## 2014-09-28 DIAGNOSIS — D631 Anemia in chronic kidney disease: Secondary | ICD-10-CM | POA: Diagnosis not present

## 2014-09-28 DIAGNOSIS — N186 End stage renal disease: Secondary | ICD-10-CM | POA: Diagnosis not present

## 2014-09-28 DIAGNOSIS — N2581 Secondary hyperparathyroidism of renal origin: Secondary | ICD-10-CM | POA: Diagnosis not present

## 2014-09-28 DIAGNOSIS — Z992 Dependence on renal dialysis: Secondary | ICD-10-CM | POA: Diagnosis not present

## 2014-09-28 DIAGNOSIS — D509 Iron deficiency anemia, unspecified: Secondary | ICD-10-CM | POA: Diagnosis not present

## 2014-09-28 DIAGNOSIS — L03113 Cellulitis of right upper limb: Secondary | ICD-10-CM | POA: Diagnosis not present

## 2014-09-30 DIAGNOSIS — N186 End stage renal disease: Secondary | ICD-10-CM | POA: Diagnosis not present

## 2014-09-30 DIAGNOSIS — Z992 Dependence on renal dialysis: Secondary | ICD-10-CM | POA: Diagnosis not present

## 2014-10-01 DIAGNOSIS — L03113 Cellulitis of right upper limb: Secondary | ICD-10-CM | POA: Diagnosis not present

## 2014-10-03 DIAGNOSIS — D631 Anemia in chronic kidney disease: Secondary | ICD-10-CM | POA: Diagnosis not present

## 2014-10-03 DIAGNOSIS — D509 Iron deficiency anemia, unspecified: Secondary | ICD-10-CM | POA: Diagnosis not present

## 2014-10-03 DIAGNOSIS — N186 End stage renal disease: Secondary | ICD-10-CM | POA: Diagnosis not present

## 2014-10-03 DIAGNOSIS — N2581 Secondary hyperparathyroidism of renal origin: Secondary | ICD-10-CM | POA: Diagnosis not present

## 2014-10-03 DIAGNOSIS — Z992 Dependence on renal dialysis: Secondary | ICD-10-CM | POA: Diagnosis not present

## 2014-10-05 DIAGNOSIS — N2581 Secondary hyperparathyroidism of renal origin: Secondary | ICD-10-CM | POA: Diagnosis not present

## 2014-10-05 DIAGNOSIS — Z992 Dependence on renal dialysis: Secondary | ICD-10-CM | POA: Diagnosis not present

## 2014-10-05 DIAGNOSIS — D509 Iron deficiency anemia, unspecified: Secondary | ICD-10-CM | POA: Diagnosis not present

## 2014-10-05 DIAGNOSIS — N186 End stage renal disease: Secondary | ICD-10-CM | POA: Diagnosis not present

## 2014-10-05 DIAGNOSIS — D631 Anemia in chronic kidney disease: Secondary | ICD-10-CM | POA: Diagnosis not present

## 2014-10-08 DIAGNOSIS — N2581 Secondary hyperparathyroidism of renal origin: Secondary | ICD-10-CM | POA: Diagnosis not present

## 2014-10-08 DIAGNOSIS — N186 End stage renal disease: Secondary | ICD-10-CM | POA: Diagnosis not present

## 2014-10-08 DIAGNOSIS — D509 Iron deficiency anemia, unspecified: Secondary | ICD-10-CM | POA: Diagnosis not present

## 2014-10-08 DIAGNOSIS — Z992 Dependence on renal dialysis: Secondary | ICD-10-CM | POA: Diagnosis not present

## 2014-10-08 DIAGNOSIS — D631 Anemia in chronic kidney disease: Secondary | ICD-10-CM | POA: Diagnosis not present

## 2014-10-09 ENCOUNTER — Ambulatory Visit: Payer: Medicare Other

## 2014-10-11 DIAGNOSIS — G894 Chronic pain syndrome: Secondary | ICD-10-CM | POA: Diagnosis not present

## 2014-10-12 ENCOUNTER — Ambulatory Visit: Payer: Medicare Other

## 2014-10-12 DIAGNOSIS — N2581 Secondary hyperparathyroidism of renal origin: Secondary | ICD-10-CM | POA: Diagnosis not present

## 2014-10-12 DIAGNOSIS — Z992 Dependence on renal dialysis: Secondary | ICD-10-CM | POA: Diagnosis not present

## 2014-10-12 DIAGNOSIS — D631 Anemia in chronic kidney disease: Secondary | ICD-10-CM | POA: Diagnosis not present

## 2014-10-12 DIAGNOSIS — D509 Iron deficiency anemia, unspecified: Secondary | ICD-10-CM | POA: Diagnosis not present

## 2014-10-12 DIAGNOSIS — N186 End stage renal disease: Secondary | ICD-10-CM | POA: Diagnosis not present

## 2014-10-15 DIAGNOSIS — D631 Anemia in chronic kidney disease: Secondary | ICD-10-CM | POA: Diagnosis not present

## 2014-10-15 DIAGNOSIS — D509 Iron deficiency anemia, unspecified: Secondary | ICD-10-CM | POA: Diagnosis not present

## 2014-10-15 DIAGNOSIS — N2581 Secondary hyperparathyroidism of renal origin: Secondary | ICD-10-CM | POA: Diagnosis not present

## 2014-10-15 DIAGNOSIS — N186 End stage renal disease: Secondary | ICD-10-CM | POA: Diagnosis not present

## 2014-10-15 DIAGNOSIS — Z992 Dependence on renal dialysis: Secondary | ICD-10-CM | POA: Diagnosis not present

## 2014-10-16 ENCOUNTER — Ambulatory Visit
Admission: RE | Admit: 2014-10-16 | Discharge: 2014-10-16 | Disposition: A | Discharge status: Home / Self Care | Payer: Medicare Other | Source: Ambulatory Visit | Attending: Internal Medicine | Admitting: Internal Medicine

## 2014-10-16 DIAGNOSIS — F411 Generalized anxiety disorder: Secondary | ICD-10-CM | POA: Diagnosis not present

## 2014-10-16 DIAGNOSIS — Z1231 Encounter for screening mammogram for malignant neoplasm of breast: Secondary | ICD-10-CM

## 2014-10-16 DIAGNOSIS — F331 Major depressive disorder, recurrent, moderate: Secondary | ICD-10-CM | POA: Diagnosis not present

## 2014-10-17 DIAGNOSIS — Z992 Dependence on renal dialysis: Secondary | ICD-10-CM | POA: Diagnosis not present

## 2014-10-17 DIAGNOSIS — D509 Iron deficiency anemia, unspecified: Secondary | ICD-10-CM | POA: Diagnosis not present

## 2014-10-17 DIAGNOSIS — D631 Anemia in chronic kidney disease: Secondary | ICD-10-CM | POA: Diagnosis not present

## 2014-10-17 DIAGNOSIS — N2581 Secondary hyperparathyroidism of renal origin: Secondary | ICD-10-CM | POA: Diagnosis not present

## 2014-10-17 DIAGNOSIS — N186 End stage renal disease: Secondary | ICD-10-CM | POA: Diagnosis not present

## 2014-10-19 DIAGNOSIS — D631 Anemia in chronic kidney disease: Secondary | ICD-10-CM | POA: Diagnosis not present

## 2014-10-19 DIAGNOSIS — D509 Iron deficiency anemia, unspecified: Secondary | ICD-10-CM | POA: Diagnosis not present

## 2014-10-19 DIAGNOSIS — N186 End stage renal disease: Secondary | ICD-10-CM | POA: Diagnosis not present

## 2014-10-19 DIAGNOSIS — Z992 Dependence on renal dialysis: Secondary | ICD-10-CM | POA: Diagnosis not present

## 2014-10-19 DIAGNOSIS — N2581 Secondary hyperparathyroidism of renal origin: Secondary | ICD-10-CM | POA: Diagnosis not present

## 2014-10-22 DIAGNOSIS — E039 Hypothyroidism, unspecified: Secondary | ICD-10-CM | POA: Diagnosis not present

## 2014-10-22 DIAGNOSIS — Z79899 Other long term (current) drug therapy: Secondary | ICD-10-CM | POA: Diagnosis not present

## 2014-10-22 DIAGNOSIS — D649 Anemia, unspecified: Secondary | ICD-10-CM | POA: Diagnosis not present

## 2014-10-23 DIAGNOSIS — F331 Major depressive disorder, recurrent, moderate: Secondary | ICD-10-CM | POA: Diagnosis not present

## 2014-10-23 DIAGNOSIS — F411 Generalized anxiety disorder: Secondary | ICD-10-CM | POA: Diagnosis not present

## 2014-10-24 DIAGNOSIS — N186 End stage renal disease: Secondary | ICD-10-CM | POA: Diagnosis not present

## 2014-10-24 DIAGNOSIS — D631 Anemia in chronic kidney disease: Secondary | ICD-10-CM | POA: Diagnosis not present

## 2014-10-24 DIAGNOSIS — D509 Iron deficiency anemia, unspecified: Secondary | ICD-10-CM | POA: Diagnosis not present

## 2014-10-24 DIAGNOSIS — Z992 Dependence on renal dialysis: Secondary | ICD-10-CM | POA: Diagnosis not present

## 2014-10-24 DIAGNOSIS — N2581 Secondary hyperparathyroidism of renal origin: Secondary | ICD-10-CM | POA: Diagnosis not present

## 2014-10-26 DIAGNOSIS — D631 Anemia in chronic kidney disease: Secondary | ICD-10-CM | POA: Diagnosis not present

## 2014-10-26 DIAGNOSIS — Z992 Dependence on renal dialysis: Secondary | ICD-10-CM | POA: Diagnosis not present

## 2014-10-26 DIAGNOSIS — N186 End stage renal disease: Secondary | ICD-10-CM | POA: Diagnosis not present

## 2014-10-26 DIAGNOSIS — D509 Iron deficiency anemia, unspecified: Secondary | ICD-10-CM | POA: Diagnosis not present

## 2014-10-26 DIAGNOSIS — N2581 Secondary hyperparathyroidism of renal origin: Secondary | ICD-10-CM | POA: Diagnosis not present

## 2014-10-29 DIAGNOSIS — D509 Iron deficiency anemia, unspecified: Secondary | ICD-10-CM | POA: Diagnosis not present

## 2014-10-29 DIAGNOSIS — N186 End stage renal disease: Secondary | ICD-10-CM | POA: Diagnosis not present

## 2014-10-29 DIAGNOSIS — D631 Anemia in chronic kidney disease: Secondary | ICD-10-CM | POA: Diagnosis not present

## 2014-10-29 DIAGNOSIS — N2581 Secondary hyperparathyroidism of renal origin: Secondary | ICD-10-CM | POA: Diagnosis not present

## 2014-10-29 DIAGNOSIS — Z992 Dependence on renal dialysis: Secondary | ICD-10-CM | POA: Diagnosis not present

## 2014-10-30 DIAGNOSIS — F411 Generalized anxiety disorder: Secondary | ICD-10-CM | POA: Diagnosis not present

## 2014-10-30 DIAGNOSIS — F331 Major depressive disorder, recurrent, moderate: Secondary | ICD-10-CM | POA: Diagnosis not present

## 2014-10-31 DIAGNOSIS — N186 End stage renal disease: Secondary | ICD-10-CM | POA: Diagnosis not present

## 2014-10-31 DIAGNOSIS — D509 Iron deficiency anemia, unspecified: Secondary | ICD-10-CM | POA: Diagnosis not present

## 2014-10-31 DIAGNOSIS — Z992 Dependence on renal dialysis: Secondary | ICD-10-CM | POA: Diagnosis not present

## 2014-10-31 DIAGNOSIS — N2581 Secondary hyperparathyroidism of renal origin: Secondary | ICD-10-CM | POA: Diagnosis not present

## 2014-10-31 DIAGNOSIS — D631 Anemia in chronic kidney disease: Secondary | ICD-10-CM | POA: Diagnosis not present

## 2014-11-02 DIAGNOSIS — D509 Iron deficiency anemia, unspecified: Secondary | ICD-10-CM | POA: Diagnosis not present

## 2014-11-02 DIAGNOSIS — N186 End stage renal disease: Secondary | ICD-10-CM | POA: Diagnosis not present

## 2014-11-02 DIAGNOSIS — N2581 Secondary hyperparathyroidism of renal origin: Secondary | ICD-10-CM | POA: Diagnosis not present

## 2014-11-02 DIAGNOSIS — Z23 Encounter for immunization: Secondary | ICD-10-CM | POA: Diagnosis not present

## 2014-11-02 DIAGNOSIS — Z992 Dependence on renal dialysis: Secondary | ICD-10-CM | POA: Diagnosis not present

## 2014-11-02 DIAGNOSIS — D631 Anemia in chronic kidney disease: Secondary | ICD-10-CM | POA: Diagnosis not present

## 2014-11-05 DIAGNOSIS — D509 Iron deficiency anemia, unspecified: Secondary | ICD-10-CM | POA: Diagnosis not present

## 2014-11-05 DIAGNOSIS — N186 End stage renal disease: Secondary | ICD-10-CM | POA: Diagnosis not present

## 2014-11-05 DIAGNOSIS — D631 Anemia in chronic kidney disease: Secondary | ICD-10-CM | POA: Diagnosis not present

## 2014-11-05 DIAGNOSIS — Z23 Encounter for immunization: Secondary | ICD-10-CM | POA: Diagnosis not present

## 2014-11-05 DIAGNOSIS — N2581 Secondary hyperparathyroidism of renal origin: Secondary | ICD-10-CM | POA: Diagnosis not present

## 2014-11-05 DIAGNOSIS — Z992 Dependence on renal dialysis: Secondary | ICD-10-CM | POA: Diagnosis not present

## 2014-11-08 DIAGNOSIS — G894 Chronic pain syndrome: Secondary | ICD-10-CM | POA: Diagnosis not present

## 2014-11-09 DIAGNOSIS — D509 Iron deficiency anemia, unspecified: Secondary | ICD-10-CM | POA: Diagnosis not present

## 2014-11-09 DIAGNOSIS — Z23 Encounter for immunization: Secondary | ICD-10-CM | POA: Diagnosis not present

## 2014-11-09 DIAGNOSIS — D631 Anemia in chronic kidney disease: Secondary | ICD-10-CM | POA: Diagnosis not present

## 2014-11-09 DIAGNOSIS — N186 End stage renal disease: Secondary | ICD-10-CM | POA: Diagnosis not present

## 2014-11-09 DIAGNOSIS — Z992 Dependence on renal dialysis: Secondary | ICD-10-CM | POA: Diagnosis not present

## 2014-11-09 DIAGNOSIS — N2581 Secondary hyperparathyroidism of renal origin: Secondary | ICD-10-CM | POA: Diagnosis not present

## 2014-11-12 DIAGNOSIS — Z992 Dependence on renal dialysis: Secondary | ICD-10-CM | POA: Diagnosis not present

## 2014-11-12 DIAGNOSIS — Z23 Encounter for immunization: Secondary | ICD-10-CM | POA: Diagnosis not present

## 2014-11-12 DIAGNOSIS — D509 Iron deficiency anemia, unspecified: Secondary | ICD-10-CM | POA: Diagnosis not present

## 2014-11-12 DIAGNOSIS — N2581 Secondary hyperparathyroidism of renal origin: Secondary | ICD-10-CM | POA: Diagnosis not present

## 2014-11-12 DIAGNOSIS — D631 Anemia in chronic kidney disease: Secondary | ICD-10-CM | POA: Diagnosis not present

## 2014-11-12 DIAGNOSIS — F331 Major depressive disorder, recurrent, moderate: Secondary | ICD-10-CM | POA: Diagnosis not present

## 2014-11-12 DIAGNOSIS — N186 End stage renal disease: Secondary | ICD-10-CM | POA: Diagnosis not present

## 2014-11-12 DIAGNOSIS — F411 Generalized anxiety disorder: Secondary | ICD-10-CM | POA: Diagnosis not present

## 2014-11-13 ENCOUNTER — Other Ambulatory Visit: Payer: Self-pay | Admitting: *Deleted

## 2014-11-13 MED ORDER — LORAZEPAM 0.5 MG PO TABS: 0.2500 mg | ORAL_TABLET | Freq: Four times a day (QID) | ORAL | Status: DC | PRN

## 2014-11-13 NOTE — Telephone Encounter (Signed)
[facility] 

## 2014-11-14 DIAGNOSIS — Z23 Encounter for immunization: Secondary | ICD-10-CM | POA: Diagnosis not present

## 2014-11-14 DIAGNOSIS — N2581 Secondary hyperparathyroidism of renal origin: Secondary | ICD-10-CM | POA: Diagnosis not present

## 2014-11-14 DIAGNOSIS — Z992 Dependence on renal dialysis: Secondary | ICD-10-CM | POA: Diagnosis not present

## 2014-11-14 DIAGNOSIS — N186 End stage renal disease: Secondary | ICD-10-CM | POA: Diagnosis not present

## 2014-11-14 DIAGNOSIS — D631 Anemia in chronic kidney disease: Secondary | ICD-10-CM | POA: Diagnosis not present

## 2014-11-14 DIAGNOSIS — D509 Iron deficiency anemia, unspecified: Secondary | ICD-10-CM | POA: Diagnosis not present

## 2014-11-16 DIAGNOSIS — Z23 Encounter for immunization: Secondary | ICD-10-CM | POA: Diagnosis not present

## 2014-11-16 DIAGNOSIS — Z992 Dependence on renal dialysis: Secondary | ICD-10-CM | POA: Diagnosis not present

## 2014-11-16 DIAGNOSIS — D631 Anemia in chronic kidney disease: Secondary | ICD-10-CM | POA: Diagnosis not present

## 2014-11-16 DIAGNOSIS — N186 End stage renal disease: Secondary | ICD-10-CM | POA: Diagnosis not present

## 2014-11-16 DIAGNOSIS — D509 Iron deficiency anemia, unspecified: Secondary | ICD-10-CM | POA: Diagnosis not present

## 2014-11-16 DIAGNOSIS — N2581 Secondary hyperparathyroidism of renal origin: Secondary | ICD-10-CM | POA: Diagnosis not present

## 2014-11-19 DIAGNOSIS — N2581 Secondary hyperparathyroidism of renal origin: Secondary | ICD-10-CM | POA: Diagnosis not present

## 2014-11-19 DIAGNOSIS — Z992 Dependence on renal dialysis: Secondary | ICD-10-CM | POA: Diagnosis not present

## 2014-11-19 DIAGNOSIS — D509 Iron deficiency anemia, unspecified: Secondary | ICD-10-CM | POA: Diagnosis not present

## 2014-11-19 DIAGNOSIS — Z23 Encounter for immunization: Secondary | ICD-10-CM | POA: Diagnosis not present

## 2014-11-19 DIAGNOSIS — D631 Anemia in chronic kidney disease: Secondary | ICD-10-CM | POA: Diagnosis not present

## 2014-11-19 DIAGNOSIS — N186 End stage renal disease: Secondary | ICD-10-CM | POA: Diagnosis not present

## 2014-11-20 DIAGNOSIS — F411 Generalized anxiety disorder: Secondary | ICD-10-CM | POA: Diagnosis not present

## 2014-11-20 DIAGNOSIS — F331 Major depressive disorder, recurrent, moderate: Secondary | ICD-10-CM | POA: Diagnosis not present

## 2014-11-21 DIAGNOSIS — D631 Anemia in chronic kidney disease: Secondary | ICD-10-CM | POA: Diagnosis not present

## 2014-11-21 DIAGNOSIS — Z992 Dependence on renal dialysis: Secondary | ICD-10-CM | POA: Diagnosis not present

## 2014-11-21 DIAGNOSIS — Z23 Encounter for immunization: Secondary | ICD-10-CM | POA: Diagnosis not present

## 2014-11-21 DIAGNOSIS — N2581 Secondary hyperparathyroidism of renal origin: Secondary | ICD-10-CM | POA: Diagnosis not present

## 2014-11-21 DIAGNOSIS — N186 End stage renal disease: Secondary | ICD-10-CM | POA: Diagnosis not present

## 2014-11-21 DIAGNOSIS — D509 Iron deficiency anemia, unspecified: Secondary | ICD-10-CM | POA: Diagnosis not present

## 2014-11-23 DIAGNOSIS — Z23 Encounter for immunization: Secondary | ICD-10-CM | POA: Diagnosis not present

## 2014-11-23 DIAGNOSIS — D631 Anemia in chronic kidney disease: Secondary | ICD-10-CM | POA: Diagnosis not present

## 2014-11-23 DIAGNOSIS — N2581 Secondary hyperparathyroidism of renal origin: Secondary | ICD-10-CM | POA: Diagnosis not present

## 2014-11-23 DIAGNOSIS — Z992 Dependence on renal dialysis: Secondary | ICD-10-CM | POA: Diagnosis not present

## 2014-11-23 DIAGNOSIS — N186 End stage renal disease: Secondary | ICD-10-CM | POA: Diagnosis not present

## 2014-11-23 DIAGNOSIS — D509 Iron deficiency anemia, unspecified: Secondary | ICD-10-CM | POA: Diagnosis not present

## 2014-11-26 DIAGNOSIS — Z23 Encounter for immunization: Secondary | ICD-10-CM | POA: Diagnosis not present

## 2014-11-26 DIAGNOSIS — D631 Anemia in chronic kidney disease: Secondary | ICD-10-CM | POA: Diagnosis not present

## 2014-11-26 DIAGNOSIS — D509 Iron deficiency anemia, unspecified: Secondary | ICD-10-CM | POA: Diagnosis not present

## 2014-11-26 DIAGNOSIS — N2581 Secondary hyperparathyroidism of renal origin: Secondary | ICD-10-CM | POA: Diagnosis not present

## 2014-11-26 DIAGNOSIS — Z992 Dependence on renal dialysis: Secondary | ICD-10-CM | POA: Diagnosis not present

## 2014-11-26 DIAGNOSIS — N186 End stage renal disease: Secondary | ICD-10-CM | POA: Diagnosis not present

## 2014-11-28 DIAGNOSIS — D509 Iron deficiency anemia, unspecified: Secondary | ICD-10-CM | POA: Diagnosis not present

## 2014-11-28 DIAGNOSIS — N186 End stage renal disease: Secondary | ICD-10-CM | POA: Diagnosis not present

## 2014-11-28 DIAGNOSIS — N2581 Secondary hyperparathyroidism of renal origin: Secondary | ICD-10-CM | POA: Diagnosis not present

## 2014-11-28 DIAGNOSIS — Z992 Dependence on renal dialysis: Secondary | ICD-10-CM | POA: Diagnosis not present

## 2014-11-28 DIAGNOSIS — D631 Anemia in chronic kidney disease: Secondary | ICD-10-CM | POA: Diagnosis not present

## 2014-11-28 DIAGNOSIS — Z23 Encounter for immunization: Secondary | ICD-10-CM | POA: Diagnosis not present

## 2014-11-30 DIAGNOSIS — Z992 Dependence on renal dialysis: Secondary | ICD-10-CM | POA: Diagnosis not present

## 2014-11-30 DIAGNOSIS — N186 End stage renal disease: Secondary | ICD-10-CM | POA: Diagnosis not present

## 2014-12-03 DIAGNOSIS — Z992 Dependence on renal dialysis: Secondary | ICD-10-CM | POA: Diagnosis not present

## 2014-12-03 DIAGNOSIS — N2581 Secondary hyperparathyroidism of renal origin: Secondary | ICD-10-CM | POA: Diagnosis not present

## 2014-12-03 DIAGNOSIS — D631 Anemia in chronic kidney disease: Secondary | ICD-10-CM | POA: Diagnosis not present

## 2014-12-03 DIAGNOSIS — N186 End stage renal disease: Secondary | ICD-10-CM | POA: Diagnosis not present

## 2014-12-03 DIAGNOSIS — D509 Iron deficiency anemia, unspecified: Secondary | ICD-10-CM | POA: Diagnosis not present

## 2014-12-05 ENCOUNTER — Non-Acute Institutional Stay (SKILLED_NURSING_FACILITY): Payer: Medicare Other | Admitting: Internal Medicine

## 2014-12-05 DIAGNOSIS — R51 Headache: Secondary | ICD-10-CM | POA: Diagnosis not present

## 2014-12-05 DIAGNOSIS — B20 Human immunodeficiency virus [HIV] disease: Secondary | ICD-10-CM | POA: Diagnosis not present

## 2014-12-05 DIAGNOSIS — D631 Anemia in chronic kidney disease: Secondary | ICD-10-CM | POA: Diagnosis not present

## 2014-12-05 DIAGNOSIS — F32A Depression, unspecified: Secondary | ICD-10-CM

## 2014-12-05 DIAGNOSIS — R634 Abnormal weight loss: Secondary | ICD-10-CM

## 2014-12-05 DIAGNOSIS — Z992 Dependence on renal dialysis: Secondary | ICD-10-CM | POA: Diagnosis not present

## 2014-12-05 DIAGNOSIS — R519 Headache, unspecified: Secondary | ICD-10-CM

## 2014-12-05 DIAGNOSIS — F450 Somatization disorder: Secondary | ICD-10-CM

## 2014-12-05 DIAGNOSIS — N2581 Secondary hyperparathyroidism of renal origin: Secondary | ICD-10-CM | POA: Diagnosis not present

## 2014-12-05 DIAGNOSIS — F329 Major depressive disorder, single episode, unspecified: Secondary | ICD-10-CM

## 2014-12-05 DIAGNOSIS — D509 Iron deficiency anemia, unspecified: Secondary | ICD-10-CM | POA: Diagnosis not present

## 2014-12-05 DIAGNOSIS — N186 End stage renal disease: Secondary | ICD-10-CM | POA: Diagnosis not present

## 2014-12-06 DIAGNOSIS — G894 Chronic pain syndrome: Secondary | ICD-10-CM | POA: Diagnosis not present

## 2014-12-06 DIAGNOSIS — R51 Headache: Secondary | ICD-10-CM | POA: Diagnosis not present

## 2014-12-07 DIAGNOSIS — D509 Iron deficiency anemia, unspecified: Secondary | ICD-10-CM | POA: Diagnosis not present

## 2014-12-07 DIAGNOSIS — N2581 Secondary hyperparathyroidism of renal origin: Secondary | ICD-10-CM | POA: Diagnosis not present

## 2014-12-07 DIAGNOSIS — Z992 Dependence on renal dialysis: Secondary | ICD-10-CM | POA: Diagnosis not present

## 2014-12-07 DIAGNOSIS — N186 End stage renal disease: Secondary | ICD-10-CM | POA: Diagnosis not present

## 2014-12-07 DIAGNOSIS — D631 Anemia in chronic kidney disease: Secondary | ICD-10-CM | POA: Diagnosis not present

## 2014-12-10 DIAGNOSIS — N186 End stage renal disease: Secondary | ICD-10-CM | POA: Diagnosis not present

## 2014-12-10 DIAGNOSIS — N2581 Secondary hyperparathyroidism of renal origin: Secondary | ICD-10-CM | POA: Diagnosis not present

## 2014-12-10 DIAGNOSIS — D631 Anemia in chronic kidney disease: Secondary | ICD-10-CM | POA: Diagnosis not present

## 2014-12-10 DIAGNOSIS — D509 Iron deficiency anemia, unspecified: Secondary | ICD-10-CM | POA: Diagnosis not present

## 2014-12-10 DIAGNOSIS — Z992 Dependence on renal dialysis: Secondary | ICD-10-CM | POA: Diagnosis not present

## 2014-12-10 NOTE — Progress Notes (Addendum)
Patient ID: [name], female   DOB: [birth year], 64 y.o.   MRN: [id number]                PROGRESS NOTE  DATE:  12/05/2014           FACILITY: [name]                    LEVEL OF CARE:   SNF   Routine Visit                  CHIEF COMPLAINT:  Routine visit to follow medical issues including chronic renal failure, HIV, hypertension.     HISTORY OF PRESENT ILLNESS:  [name] has been in the building since 2010.    She follows with Dr. Johnnye Sima of Infectious Disease, Dr. Chancy Milroy of Pain Management in the facility.    She is also on dialysis.  Her compliance with dialysis has actually been better of late, as far as I am aware.      The major issues that I have had to deal with in the past year include two episodes of edema of her face and arm.    She has had recurrent stenosis in the right subclavian and right brachiocephalic vein.  When she develops this, she goes to see Dr. Bridgett Larsson of Vein and Vascular for venoplasty.  She has not had any recent problems in this regard.     Last HIV testing, I believe done by Dr. Johnnye Sima, showed an HIV-1 quantitative RNA of less than 20.  On 10/16/2014, she had a mammogram which showed scattered areas of fibroglandular density.  No suspicious findings for malignancy.    PAST MEDICAL HISTORY/PROBLEM LIST:                 HIV.    End-stage renal disease.    On dialysis.     Depression.    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    Hypertension.     CURRENT MEDICATIONS:  Medication list is reviewed.           Epivir 25 mg daily.     Prilosec 40 q.d.      Septra DS 1 tablet every Tuesday, Thursday, and Saturday.     Wellbutrin XR 300 daily.      Zoloft 100 q.d.      Duragesic 50 q.72.    Lopressor 50 b.i.d.      Raltegravir 400 b.i.d.       Vitamin C 500 b.i.d.      Cardizem 60 q.8.     Renvela  800 mg, 2 tablets/1600 mg three times a day.     Sildenafil 20 mg three times a day.    Retrovir  100 three times a day.     Oxycodone 10 mg q.h.s.      REVIEW OF SYSTEMS:    GENERAL: Patient notes weight loss HEENT:   She is complaining of left-sided facial pain for the last two weeks.   She has not had any nasal drainage or congestion.   She is edentulous.     CHEST/RESPIRATORY:  No cough.  No sputum.    CARDIAC:  No chest pain.   GI:  No nausea, vomiting, or diarrhea.          MUSCULOSKELETAL:  Extremities:  She does not complain of any current pain.   No swelling in the arm or the neck.    NEUROLOGIC:  no weakness, no numbness PSYCHIATRIC:  Mental status:   States she feels stable.  She has had clear episodes of depression in the past.  This does not seem to be currently.      PHYSICAL EXAMINATION:   VITAL SIGNS:     TEMPERATURE:  98.3.     PULSE:  72.    RESPIRATIONS:  18.    BLOOD PRESSURE:  124/83.     02 SATURATIONS:  97%.    WEIGHT:  103 pounds.   This compares to 112 pounds a year ago, and generally reflects a declining trend.     HEENT:   HEAD:  She is tender over the left maxillary sinus.   She has grating over the TMJ joint on the left, as well.   MOUTH/THROAT:  She is edentulous.   Tongue is somewhat coated.   NECK/THYROID:  No lymphadenopathy is felt.  No thyroid.     CHEST/RESPIRATORY:  Exam is clear.     CARDIOVASCULAR:   CARDIAC:  Heart sounds are normal.  There are no murmurs.    GASTROINTESTINAL:   LIVER/SPLEEN/KIDNEYS:  No liver, no spleen.  No tenderness.     CIRCULATION:   EDEMA/VARICOSITIES:  Extremities:  No edema.     SKIN:   INSPECTION:  Fistula in the right arm looks stable.   MENTAL STATUS: Flat affect, does not engage me in conversation.   ASSESSMENT/PLAN:                  Rule out sinusitis on the left, although this is vague.   Initially, I thought this might be a temporomandibular joint problem, but she localizes this more to the front.    HIV.  Followed by Dr. Johnnye Sima.  No recent changes in medications.    End-stage renal disease.  On  dialysis.     Chronic pain syndrome.  Followed by Dr. Chancy Milroy in the facility.     History of depression.   I wonder if this is the major issue in regards to her weight loss.  She is on Wellbutrin 300.   She was last seen recently by Psychiatry, who did not feel that her depression was active based on an HAM-D score.    Weight loss, as noted.    An x-ray of her face has been ordered to rule out sinusitis.    I am going to continue to follow her weight.  The cause of her gradual weight loss over the course of this year is unclear.

## 2014-12-12 ENCOUNTER — Encounter: Payer: Self-pay | Admitting: Infectious Diseases

## 2014-12-12 DIAGNOSIS — Z992 Dependence on renal dialysis: Secondary | ICD-10-CM | POA: Diagnosis not present

## 2014-12-12 DIAGNOSIS — N186 End stage renal disease: Secondary | ICD-10-CM | POA: Diagnosis not present

## 2014-12-12 DIAGNOSIS — D631 Anemia in chronic kidney disease: Secondary | ICD-10-CM | POA: Diagnosis not present

## 2014-12-12 DIAGNOSIS — D509 Iron deficiency anemia, unspecified: Secondary | ICD-10-CM | POA: Diagnosis not present

## 2014-12-12 DIAGNOSIS — N2581 Secondary hyperparathyroidism of renal origin: Secondary | ICD-10-CM | POA: Diagnosis not present

## 2014-12-17 DIAGNOSIS — D509 Iron deficiency anemia, unspecified: Secondary | ICD-10-CM | POA: Diagnosis not present

## 2014-12-17 DIAGNOSIS — Z992 Dependence on renal dialysis: Secondary | ICD-10-CM | POA: Diagnosis not present

## 2014-12-17 DIAGNOSIS — N186 End stage renal disease: Secondary | ICD-10-CM | POA: Diagnosis not present

## 2014-12-17 DIAGNOSIS — D631 Anemia in chronic kidney disease: Secondary | ICD-10-CM | POA: Diagnosis not present

## 2014-12-17 DIAGNOSIS — N2581 Secondary hyperparathyroidism of renal origin: Secondary | ICD-10-CM | POA: Diagnosis not present

## 2014-12-19 DIAGNOSIS — N2581 Secondary hyperparathyroidism of renal origin: Secondary | ICD-10-CM | POA: Diagnosis not present

## 2014-12-19 DIAGNOSIS — N186 End stage renal disease: Secondary | ICD-10-CM | POA: Diagnosis not present

## 2014-12-19 DIAGNOSIS — Z992 Dependence on renal dialysis: Secondary | ICD-10-CM | POA: Diagnosis not present

## 2014-12-19 DIAGNOSIS — D631 Anemia in chronic kidney disease: Secondary | ICD-10-CM | POA: Diagnosis not present

## 2014-12-19 DIAGNOSIS — D509 Iron deficiency anemia, unspecified: Secondary | ICD-10-CM | POA: Diagnosis not present

## 2014-12-21 DIAGNOSIS — D631 Anemia in chronic kidney disease: Secondary | ICD-10-CM | POA: Diagnosis not present

## 2014-12-21 DIAGNOSIS — Z992 Dependence on renal dialysis: Secondary | ICD-10-CM | POA: Diagnosis not present

## 2014-12-21 DIAGNOSIS — N2581 Secondary hyperparathyroidism of renal origin: Secondary | ICD-10-CM | POA: Diagnosis not present

## 2014-12-21 DIAGNOSIS — N186 End stage renal disease: Secondary | ICD-10-CM | POA: Diagnosis not present

## 2014-12-21 DIAGNOSIS — D509 Iron deficiency anemia, unspecified: Secondary | ICD-10-CM | POA: Diagnosis not present

## 2014-12-24 DIAGNOSIS — Z992 Dependence on renal dialysis: Secondary | ICD-10-CM | POA: Diagnosis not present

## 2014-12-24 DIAGNOSIS — N186 End stage renal disease: Secondary | ICD-10-CM | POA: Diagnosis not present

## 2014-12-24 DIAGNOSIS — D631 Anemia in chronic kidney disease: Secondary | ICD-10-CM | POA: Diagnosis not present

## 2014-12-24 DIAGNOSIS — D509 Iron deficiency anemia, unspecified: Secondary | ICD-10-CM | POA: Diagnosis not present

## 2014-12-24 DIAGNOSIS — N2581 Secondary hyperparathyroidism of renal origin: Secondary | ICD-10-CM | POA: Diagnosis not present

## 2014-12-28 DIAGNOSIS — N186 End stage renal disease: Secondary | ICD-10-CM | POA: Diagnosis not present

## 2014-12-28 DIAGNOSIS — N2581 Secondary hyperparathyroidism of renal origin: Secondary | ICD-10-CM | POA: Diagnosis not present

## 2014-12-28 DIAGNOSIS — Z992 Dependence on renal dialysis: Secondary | ICD-10-CM | POA: Diagnosis not present

## 2014-12-28 DIAGNOSIS — D631 Anemia in chronic kidney disease: Secondary | ICD-10-CM | POA: Diagnosis not present

## 2014-12-28 DIAGNOSIS — D509 Iron deficiency anemia, unspecified: Secondary | ICD-10-CM | POA: Diagnosis not present

## 2014-12-31 DIAGNOSIS — N2581 Secondary hyperparathyroidism of renal origin: Secondary | ICD-10-CM | POA: Diagnosis not present

## 2014-12-31 DIAGNOSIS — F331 Major depressive disorder, recurrent, moderate: Secondary | ICD-10-CM | POA: Diagnosis not present

## 2014-12-31 DIAGNOSIS — N186 End stage renal disease: Secondary | ICD-10-CM | POA: Diagnosis not present

## 2014-12-31 DIAGNOSIS — Z992 Dependence on renal dialysis: Secondary | ICD-10-CM | POA: Diagnosis not present

## 2014-12-31 DIAGNOSIS — D631 Anemia in chronic kidney disease: Secondary | ICD-10-CM | POA: Diagnosis not present

## 2014-12-31 DIAGNOSIS — D509 Iron deficiency anemia, unspecified: Secondary | ICD-10-CM | POA: Diagnosis not present

## 2014-12-31 DIAGNOSIS — F411 Generalized anxiety disorder: Secondary | ICD-10-CM | POA: Diagnosis not present

## 2015-01-03 DIAGNOSIS — G894 Chronic pain syndrome: Secondary | ICD-10-CM | POA: Diagnosis not present

## 2015-01-04 DIAGNOSIS — N186 End stage renal disease: Secondary | ICD-10-CM | POA: Diagnosis not present

## 2015-01-04 DIAGNOSIS — Z992 Dependence on renal dialysis: Secondary | ICD-10-CM | POA: Diagnosis not present

## 2015-01-04 DIAGNOSIS — D509 Iron deficiency anemia, unspecified: Secondary | ICD-10-CM | POA: Diagnosis not present

## 2015-01-04 DIAGNOSIS — N2581 Secondary hyperparathyroidism of renal origin: Secondary | ICD-10-CM | POA: Diagnosis not present

## 2015-01-04 DIAGNOSIS — D631 Anemia in chronic kidney disease: Secondary | ICD-10-CM | POA: Diagnosis not present

## 2015-01-07 DIAGNOSIS — D509 Iron deficiency anemia, unspecified: Secondary | ICD-10-CM | POA: Diagnosis not present

## 2015-01-07 DIAGNOSIS — N186 End stage renal disease: Secondary | ICD-10-CM | POA: Diagnosis not present

## 2015-01-07 DIAGNOSIS — N2581 Secondary hyperparathyroidism of renal origin: Secondary | ICD-10-CM | POA: Diagnosis not present

## 2015-01-07 DIAGNOSIS — D631 Anemia in chronic kidney disease: Secondary | ICD-10-CM | POA: Diagnosis not present

## 2015-01-07 DIAGNOSIS — Z992 Dependence on renal dialysis: Secondary | ICD-10-CM | POA: Diagnosis not present

## 2015-01-11 DIAGNOSIS — D509 Iron deficiency anemia, unspecified: Secondary | ICD-10-CM | POA: Diagnosis not present

## 2015-01-11 DIAGNOSIS — N2581 Secondary hyperparathyroidism of renal origin: Secondary | ICD-10-CM | POA: Diagnosis not present

## 2015-01-11 DIAGNOSIS — Z992 Dependence on renal dialysis: Secondary | ICD-10-CM | POA: Diagnosis not present

## 2015-01-11 DIAGNOSIS — D631 Anemia in chronic kidney disease: Secondary | ICD-10-CM | POA: Diagnosis not present

## 2015-01-11 DIAGNOSIS — N186 End stage renal disease: Secondary | ICD-10-CM | POA: Diagnosis not present

## 2015-01-14 DIAGNOSIS — N2581 Secondary hyperparathyroidism of renal origin: Secondary | ICD-10-CM | POA: Diagnosis not present

## 2015-01-14 DIAGNOSIS — N186 End stage renal disease: Secondary | ICD-10-CM | POA: Diagnosis not present

## 2015-01-14 DIAGNOSIS — D631 Anemia in chronic kidney disease: Secondary | ICD-10-CM | POA: Diagnosis not present

## 2015-01-14 DIAGNOSIS — D509 Iron deficiency anemia, unspecified: Secondary | ICD-10-CM | POA: Diagnosis not present

## 2015-01-14 DIAGNOSIS — Z992 Dependence on renal dialysis: Secondary | ICD-10-CM | POA: Diagnosis not present

## 2015-01-16 DIAGNOSIS — N186 End stage renal disease: Secondary | ICD-10-CM | POA: Diagnosis not present

## 2015-01-16 DIAGNOSIS — D509 Iron deficiency anemia, unspecified: Secondary | ICD-10-CM | POA: Diagnosis not present

## 2015-01-16 DIAGNOSIS — Z992 Dependence on renal dialysis: Secondary | ICD-10-CM | POA: Diagnosis not present

## 2015-01-16 DIAGNOSIS — N2581 Secondary hyperparathyroidism of renal origin: Secondary | ICD-10-CM | POA: Diagnosis not present

## 2015-01-16 DIAGNOSIS — D631 Anemia in chronic kidney disease: Secondary | ICD-10-CM | POA: Diagnosis not present

## 2015-01-18 DIAGNOSIS — D509 Iron deficiency anemia, unspecified: Secondary | ICD-10-CM | POA: Diagnosis not present

## 2015-01-18 DIAGNOSIS — Z992 Dependence on renal dialysis: Secondary | ICD-10-CM | POA: Diagnosis not present

## 2015-01-18 DIAGNOSIS — N186 End stage renal disease: Secondary | ICD-10-CM | POA: Diagnosis not present

## 2015-01-18 DIAGNOSIS — N2581 Secondary hyperparathyroidism of renal origin: Secondary | ICD-10-CM | POA: Diagnosis not present

## 2015-01-18 DIAGNOSIS — D631 Anemia in chronic kidney disease: Secondary | ICD-10-CM | POA: Diagnosis not present

## 2015-01-21 DIAGNOSIS — D509 Iron deficiency anemia, unspecified: Secondary | ICD-10-CM | POA: Diagnosis not present

## 2015-01-21 DIAGNOSIS — Z992 Dependence on renal dialysis: Secondary | ICD-10-CM | POA: Diagnosis not present

## 2015-01-21 DIAGNOSIS — N186 End stage renal disease: Secondary | ICD-10-CM | POA: Diagnosis not present

## 2015-01-21 DIAGNOSIS — D631 Anemia in chronic kidney disease: Secondary | ICD-10-CM | POA: Diagnosis not present

## 2015-01-21 DIAGNOSIS — N2581 Secondary hyperparathyroidism of renal origin: Secondary | ICD-10-CM | POA: Diagnosis not present

## 2015-01-22 DIAGNOSIS — F411 Generalized anxiety disorder: Secondary | ICD-10-CM | POA: Diagnosis not present

## 2015-01-22 DIAGNOSIS — F331 Major depressive disorder, recurrent, moderate: Secondary | ICD-10-CM | POA: Diagnosis not present

## 2015-01-23 DIAGNOSIS — D631 Anemia in chronic kidney disease: Secondary | ICD-10-CM | POA: Diagnosis not present

## 2015-01-23 DIAGNOSIS — N2581 Secondary hyperparathyroidism of renal origin: Secondary | ICD-10-CM | POA: Diagnosis not present

## 2015-01-23 DIAGNOSIS — Z992 Dependence on renal dialysis: Secondary | ICD-10-CM | POA: Diagnosis not present

## 2015-01-23 DIAGNOSIS — N186 End stage renal disease: Secondary | ICD-10-CM | POA: Diagnosis not present

## 2015-01-23 DIAGNOSIS — D509 Iron deficiency anemia, unspecified: Secondary | ICD-10-CM | POA: Diagnosis not present

## 2015-01-25 DIAGNOSIS — Z992 Dependence on renal dialysis: Secondary | ICD-10-CM | POA: Diagnosis not present

## 2015-01-25 DIAGNOSIS — N186 End stage renal disease: Secondary | ICD-10-CM | POA: Diagnosis not present

## 2015-01-25 DIAGNOSIS — N2581 Secondary hyperparathyroidism of renal origin: Secondary | ICD-10-CM | POA: Diagnosis not present

## 2015-01-25 DIAGNOSIS — D631 Anemia in chronic kidney disease: Secondary | ICD-10-CM | POA: Diagnosis not present

## 2015-01-25 DIAGNOSIS — D509 Iron deficiency anemia, unspecified: Secondary | ICD-10-CM | POA: Diagnosis not present

## 2015-01-28 DIAGNOSIS — D509 Iron deficiency anemia, unspecified: Secondary | ICD-10-CM | POA: Diagnosis not present

## 2015-01-28 DIAGNOSIS — Z992 Dependence on renal dialysis: Secondary | ICD-10-CM | POA: Diagnosis not present

## 2015-01-28 DIAGNOSIS — N186 End stage renal disease: Secondary | ICD-10-CM | POA: Diagnosis not present

## 2015-01-28 DIAGNOSIS — N2581 Secondary hyperparathyroidism of renal origin: Secondary | ICD-10-CM | POA: Diagnosis not present

## 2015-01-28 DIAGNOSIS — D631 Anemia in chronic kidney disease: Secondary | ICD-10-CM | POA: Diagnosis not present

## 2015-01-30 DIAGNOSIS — N186 End stage renal disease: Secondary | ICD-10-CM | POA: Diagnosis not present

## 2015-01-30 DIAGNOSIS — G894 Chronic pain syndrome: Secondary | ICD-10-CM | POA: Diagnosis not present

## 2015-01-30 DIAGNOSIS — Z992 Dependence on renal dialysis: Secondary | ICD-10-CM | POA: Diagnosis not present

## 2015-02-01 DIAGNOSIS — D631 Anemia in chronic kidney disease: Secondary | ICD-10-CM | POA: Diagnosis not present

## 2015-02-01 DIAGNOSIS — T82898A Other specified complication of vascular prosthetic devices, implants and grafts, initial encounter: Secondary | ICD-10-CM | POA: Diagnosis not present

## 2015-02-01 DIAGNOSIS — T82848A Pain from vascular prosthetic devices, implants and grafts, initial encounter: Secondary | ICD-10-CM | POA: Diagnosis not present

## 2015-02-01 DIAGNOSIS — Z992 Dependence on renal dialysis: Secondary | ICD-10-CM | POA: Diagnosis not present

## 2015-02-01 DIAGNOSIS — D509 Iron deficiency anemia, unspecified: Secondary | ICD-10-CM | POA: Diagnosis not present

## 2015-02-01 DIAGNOSIS — N2581 Secondary hyperparathyroidism of renal origin: Secondary | ICD-10-CM | POA: Diagnosis not present

## 2015-02-01 DIAGNOSIS — N186 End stage renal disease: Secondary | ICD-10-CM | POA: Diagnosis not present

## 2015-02-06 DIAGNOSIS — D631 Anemia in chronic kidney disease: Secondary | ICD-10-CM | POA: Diagnosis not present

## 2015-02-06 DIAGNOSIS — N2581 Secondary hyperparathyroidism of renal origin: Secondary | ICD-10-CM | POA: Diagnosis not present

## 2015-02-06 DIAGNOSIS — Z992 Dependence on renal dialysis: Secondary | ICD-10-CM | POA: Diagnosis not present

## 2015-02-06 DIAGNOSIS — T82898A Other specified complication of vascular prosthetic devices, implants and grafts, initial encounter: Secondary | ICD-10-CM | POA: Diagnosis not present

## 2015-02-06 DIAGNOSIS — D509 Iron deficiency anemia, unspecified: Secondary | ICD-10-CM | POA: Diagnosis not present

## 2015-02-06 DIAGNOSIS — N186 End stage renal disease: Secondary | ICD-10-CM | POA: Diagnosis not present

## 2015-02-07 ENCOUNTER — Other Ambulatory Visit: Payer: Medicare Other

## 2015-02-08 DIAGNOSIS — D631 Anemia in chronic kidney disease: Secondary | ICD-10-CM | POA: Diagnosis not present

## 2015-02-08 DIAGNOSIS — D509 Iron deficiency anemia, unspecified: Secondary | ICD-10-CM | POA: Diagnosis not present

## 2015-02-08 DIAGNOSIS — T82898A Other specified complication of vascular prosthetic devices, implants and grafts, initial encounter: Secondary | ICD-10-CM | POA: Diagnosis not present

## 2015-02-08 DIAGNOSIS — Z992 Dependence on renal dialysis: Secondary | ICD-10-CM | POA: Diagnosis not present

## 2015-02-08 DIAGNOSIS — N186 End stage renal disease: Secondary | ICD-10-CM | POA: Diagnosis not present

## 2015-02-08 DIAGNOSIS — N2581 Secondary hyperparathyroidism of renal origin: Secondary | ICD-10-CM | POA: Diagnosis not present

## 2015-02-11 DIAGNOSIS — N2581 Secondary hyperparathyroidism of renal origin: Secondary | ICD-10-CM | POA: Diagnosis not present

## 2015-02-11 DIAGNOSIS — N186 End stage renal disease: Secondary | ICD-10-CM | POA: Diagnosis not present

## 2015-02-11 DIAGNOSIS — D631 Anemia in chronic kidney disease: Secondary | ICD-10-CM | POA: Diagnosis not present

## 2015-02-11 DIAGNOSIS — Z992 Dependence on renal dialysis: Secondary | ICD-10-CM | POA: Diagnosis not present

## 2015-02-11 DIAGNOSIS — T82898A Other specified complication of vascular prosthetic devices, implants and grafts, initial encounter: Secondary | ICD-10-CM | POA: Diagnosis not present

## 2015-02-11 DIAGNOSIS — D509 Iron deficiency anemia, unspecified: Secondary | ICD-10-CM | POA: Diagnosis not present

## 2015-02-12 ENCOUNTER — Other Ambulatory Visit: Payer: Medicare Other

## 2015-02-12 DIAGNOSIS — Z79899 Other long term (current) drug therapy: Secondary | ICD-10-CM

## 2015-02-12 DIAGNOSIS — B20 Human immunodeficiency virus [HIV] disease: Secondary | ICD-10-CM

## 2015-02-12 DIAGNOSIS — Z113 Encounter for screening for infections with a predominantly sexual mode of transmission: Secondary | ICD-10-CM

## 2015-02-12 LAB — CBC WITH DIFFERENTIAL/PLATELET
BASOS PCT: 0 % (ref 0–1)
Basophils Absolute: 0 10*3/uL (ref 0.0–0.1)
EOS PCT: 3 % (ref 0–5)
Eosinophils Absolute: 0.1 10*3/uL (ref 0.0–0.7)
HCT: 35.6 % — ABNORMAL LOW (ref 36.0–46.0)
Hemoglobin: 11.5 g/dL — ABNORMAL LOW (ref 12.0–15.0)
Lymphocytes Relative: 41 % (ref 12–46)
Lymphs Abs: 1.1 10*3/uL (ref 0.7–4.0)
MCH: 32.9 pg (ref 26.0–34.0)
MCHC: 32.3 g/dL (ref 30.0–36.0)
MCV: 101.7 fL — AB (ref 78.0–100.0)
MONOS PCT: 11 % (ref 3–12)
MPV: 9.9 fL (ref 8.6–12.4)
Monocytes Absolute: 0.3 10*3/uL (ref 0.1–1.0)
NEUTROS PCT: 45 % (ref 43–77)
Neutro Abs: 1.3 10*3/uL — ABNORMAL LOW (ref 1.7–7.7)
PLATELETS: 162 10*3/uL (ref 150–400)
RBC: 3.5 MIL/uL — ABNORMAL LOW (ref 3.87–5.11)
RDW: 15 % (ref 11.5–15.5)
WBC: 2.8 10*3/uL — ABNORMAL LOW (ref 4.0–10.5)

## 2015-02-12 LAB — COMPLETE METABOLIC PANEL WITH GFR
ALT: 9 U/L (ref 6–29)
AST: 16 U/L (ref 10–35)
Albumin: 3.8 g/dL (ref 3.6–5.1)
Alkaline Phosphatase: 62 U/L (ref 33–130)
BUN: 44 mg/dL — ABNORMAL HIGH (ref 7–25)
CHLORIDE: 97 mmol/L — AB (ref 98–110)
CO2: 26 mmol/L (ref 20–31)
Calcium: 9.6 mg/dL (ref 8.6–10.4)
Creat: 8.54 mg/dL — ABNORMAL HIGH (ref 0.50–0.99)
GFR, EST AFRICAN AMERICAN: 5 mL/min — AB (ref >=60)
GFR, Est Non African American: 5 mL/min — ABNORMAL LOW (ref >=60)
Glucose, Bld: 76 mg/dL (ref 65–99)
POTASSIUM: 5.3 mmol/L (ref 3.5–5.3)
Sodium: 138 mmol/L (ref 135–146)
Total Bilirubin: 0.3 mg/dL (ref 0.2–1.2)
Total Protein: 8.2 g/dL — ABNORMAL HIGH (ref 6.1–8.1)

## 2015-02-12 LAB — LIPID PANEL
Cholesterol: 119 mg/dL — ABNORMAL LOW (ref 125–200)
HDL: 51 mg/dL (ref >=46)
LDL CALC: 52 mg/dL (ref <130)
TRIGLYCERIDES: 79 mg/dL (ref <150)
Total CHOL/HDL Ratio: 2.3 Ratio (ref <=5.0)
VLDL: 16 mg/dL (ref <30)

## 2015-02-13 DIAGNOSIS — N2581 Secondary hyperparathyroidism of renal origin: Secondary | ICD-10-CM | POA: Diagnosis not present

## 2015-02-13 DIAGNOSIS — T82898A Other specified complication of vascular prosthetic devices, implants and grafts, initial encounter: Secondary | ICD-10-CM | POA: Diagnosis not present

## 2015-02-13 DIAGNOSIS — D631 Anemia in chronic kidney disease: Secondary | ICD-10-CM | POA: Diagnosis not present

## 2015-02-13 DIAGNOSIS — D509 Iron deficiency anemia, unspecified: Secondary | ICD-10-CM | POA: Diagnosis not present

## 2015-02-13 DIAGNOSIS — Z992 Dependence on renal dialysis: Secondary | ICD-10-CM | POA: Diagnosis not present

## 2015-02-13 DIAGNOSIS — N186 End stage renal disease: Secondary | ICD-10-CM | POA: Diagnosis not present

## 2015-02-13 LAB — HIV-1 RNA QUANT-NO REFLEX-BLD: HIV 1 RNA Quant: <20 copies/mL (ref <20)

## 2015-02-13 LAB — T-HELPER CELL (CD4) - (RCID CLINIC ONLY)
CD4 % Helper T Cell: 26 % — ABNORMAL LOW (ref 33–55)
CD4 T CELL ABS: 280 /uL — AB (ref 400–2700)

## 2015-02-13 LAB — RPR

## 2015-02-18 DIAGNOSIS — R2231 Localized swelling, mass and lump, right upper limb: Secondary | ICD-10-CM | POA: Diagnosis not present

## 2015-02-18 DIAGNOSIS — M79601 Pain in right arm: Secondary | ICD-10-CM | POA: Diagnosis not present

## 2015-02-19 ENCOUNTER — Other Ambulatory Visit: Payer: Self-pay | Admitting: *Deleted

## 2015-02-19 DIAGNOSIS — F411 Generalized anxiety disorder: Secondary | ICD-10-CM | POA: Diagnosis not present

## 2015-02-19 DIAGNOSIS — F331 Major depressive disorder, recurrent, moderate: Secondary | ICD-10-CM | POA: Diagnosis not present

## 2015-02-19 MED ORDER — ZALEPLON 5 MG PO CAPS: ORAL_CAPSULE | ORAL | Status: DC

## 2015-02-19 NOTE — Telephone Encounter (Signed)
[facility] 

## 2015-02-20 ENCOUNTER — Encounter (HOSPITAL_COMMUNITY): Payer: Self-pay

## 2015-02-20 ENCOUNTER — Emergency Department (HOSPITAL_COMMUNITY): Payer: Medicare Other

## 2015-02-20 ENCOUNTER — Inpatient Hospital Stay (HOSPITAL_COMMUNITY)
Admission: EM | Admit: 2015-02-20 | Discharge: 2015-02-22 | DRG: 640 | Disposition: A | Discharge status: Skilled Nursing Facility | Payer: Medicare Other | Attending: Family Medicine | Admitting: Family Medicine

## 2015-02-20 DIAGNOSIS — E875 Hyperkalemia: Secondary | ICD-10-CM | POA: Diagnosis not present

## 2015-02-20 DIAGNOSIS — D696 Thrombocytopenia, unspecified: Secondary | ICD-10-CM | POA: Diagnosis present

## 2015-02-20 DIAGNOSIS — R531 Weakness: Secondary | ICD-10-CM | POA: Diagnosis not present

## 2015-02-20 DIAGNOSIS — Z9114 Patient's other noncompliance with medication regimen: Secondary | ICD-10-CM

## 2015-02-20 DIAGNOSIS — I4891 Unspecified atrial fibrillation: Secondary | ICD-10-CM | POA: Diagnosis present

## 2015-02-20 DIAGNOSIS — E872 Acidosis: Secondary | ICD-10-CM | POA: Diagnosis present

## 2015-02-20 DIAGNOSIS — X58XXXA Exposure to other specified factors, initial encounter: Secondary | ICD-10-CM | POA: Diagnosis present

## 2015-02-20 DIAGNOSIS — Z8673 Personal history of transient ischemic attack (TIA), and cerebral infarction without residual deficits: Secondary | ICD-10-CM

## 2015-02-20 DIAGNOSIS — D649 Anemia, unspecified: Secondary | ICD-10-CM | POA: Diagnosis present

## 2015-02-20 DIAGNOSIS — R110 Nausea: Secondary | ICD-10-CM | POA: Diagnosis not present

## 2015-02-20 DIAGNOSIS — Z87891 Personal history of nicotine dependence: Secondary | ICD-10-CM

## 2015-02-20 DIAGNOSIS — N186 End stage renal disease: Secondary | ICD-10-CM | POA: Diagnosis present

## 2015-02-20 DIAGNOSIS — I4892 Unspecified atrial flutter: Secondary | ICD-10-CM | POA: Diagnosis not present

## 2015-02-20 DIAGNOSIS — B20 Human immunodeficiency virus [HIV] disease: Secondary | ICD-10-CM | POA: Diagnosis present

## 2015-02-20 DIAGNOSIS — Z992 Dependence on renal dialysis: Secondary | ICD-10-CM | POA: Diagnosis not present

## 2015-02-20 DIAGNOSIS — D631 Anemia in chronic kidney disease: Secondary | ICD-10-CM | POA: Diagnosis not present

## 2015-02-20 DIAGNOSIS — R0602 Shortness of breath: Secondary | ICD-10-CM | POA: Diagnosis not present

## 2015-02-20 DIAGNOSIS — R05 Cough: Secondary | ICD-10-CM | POA: Diagnosis not present

## 2015-02-20 DIAGNOSIS — I10 Essential (primary) hypertension: Secondary | ICD-10-CM | POA: Diagnosis not present

## 2015-02-20 DIAGNOSIS — Z21 Asymptomatic human immunodeficiency virus [HIV] infection status: Secondary | ICD-10-CM | POA: Diagnosis present

## 2015-02-20 DIAGNOSIS — N19 Unspecified kidney failure: Secondary | ICD-10-CM

## 2015-02-20 DIAGNOSIS — I120 Hypertensive chronic kidney disease with stage 5 chronic kidney disease or end stage renal disease: Secondary | ICD-10-CM | POA: Diagnosis present

## 2015-02-20 DIAGNOSIS — K219 Gastro-esophageal reflux disease without esophagitis: Secondary | ICD-10-CM | POA: Diagnosis present

## 2015-02-20 DIAGNOSIS — S40021A Contusion of right upper arm, initial encounter: Secondary | ICD-10-CM | POA: Diagnosis present

## 2015-02-20 DIAGNOSIS — B1920 Unspecified viral hepatitis C without hepatic coma: Secondary | ICD-10-CM | POA: Diagnosis present

## 2015-02-20 DIAGNOSIS — K2970 Gastritis, unspecified, without bleeding: Secondary | ICD-10-CM | POA: Diagnosis not present

## 2015-02-20 DIAGNOSIS — I480 Paroxysmal atrial fibrillation: Secondary | ICD-10-CM | POA: Diagnosis not present

## 2015-02-20 DIAGNOSIS — J9600 Acute respiratory failure, unspecified whether with hypoxia or hypercapnia: Secondary | ICD-10-CM | POA: Diagnosis present

## 2015-02-20 HISTORY — DX: Contusion of right upper arm, initial encounter: S40.021A

## 2015-02-20 HISTORY — DX: Unspecified atrial flutter: I48.92

## 2015-02-20 LAB — CBC WITH DIFFERENTIAL/PLATELET
BASOS ABS: 0 10*3/uL (ref 0.0–0.1)
BASOS PCT: 0 %
EOS ABS: 0 10*3/uL (ref 0.0–0.7)
EOS PCT: 0 %
HCT: 34.4 % — ABNORMAL LOW (ref 36.0–46.0)
Hemoglobin: 10.8 g/dL — ABNORMAL LOW (ref 12.0–15.0)
LYMPHS PCT: 17 %
Lymphs Abs: 1.3 10*3/uL (ref 0.7–4.0)
MCH: 33.3 pg (ref 26.0–34.0)
MCHC: 31.4 g/dL (ref 30.0–36.0)
MCV: 106.2 fL — AB (ref 78.0–100.0)
MONO ABS: 0.3 10*3/uL (ref 0.1–1.0)
Monocytes Relative: 4 %
Neutro Abs: 5.8 10*3/uL (ref 1.7–7.7)
Neutrophils Relative %: 79 %
PLATELETS: 140 10*3/uL — AB (ref 150–400)
RBC: 3.24 MIL/uL — AB (ref 3.87–5.11)
RDW: 14.9 % (ref 11.5–15.5)
WBC: 7.4 10*3/uL (ref 4.0–10.5)

## 2015-02-20 LAB — COMPREHENSIVE METABOLIC PANEL
ALBUMIN: 3.5 g/dL (ref 3.5–5.0)
ALT: 24 U/L (ref 14–54)
AST: 27 U/L (ref 15–41)
Alkaline Phosphatase: 77 U/L (ref 38–126)
Anion gap: 18 — ABNORMAL HIGH (ref 5–15)
BUN: 128 mg/dL — AB (ref 6–20)
CHLORIDE: 100 mmol/L — AB (ref 101–111)
CO2: 18 mmol/L — AB (ref 22–32)
CREATININE: 18.34 mg/dL — AB (ref 0.44–1.00)
Calcium: 8.6 mg/dL — ABNORMAL LOW (ref 8.9–10.3)
GFR calc Af Amer: 2 mL/min — ABNORMAL LOW (ref >60)
GFR calc non Af Amer: 2 mL/min — ABNORMAL LOW (ref >60)
GLUCOSE: 121 mg/dL — AB (ref 65–99)
SODIUM: 136 mmol/L (ref 135–145)
Total Bilirubin: 0.7 mg/dL (ref 0.3–1.2)
Total Protein: 8.5 g/dL — ABNORMAL HIGH (ref 6.5–8.1)

## 2015-02-20 LAB — CBG MONITORING, ED
GLUCOSE-CAPILLARY: 89 mg/dL (ref 65–99)
GLUCOSE-CAPILLARY: 143 mg/dL — AB (ref 65–99)

## 2015-02-20 MED ORDER — CETYLPYRIDINIUM CHLORIDE 0.05 % MT LIQD
7.0000 mL | Freq: Two times a day (BID) | OROMUCOSAL | Status: DC
  Administered 2015-02-21 – 2015-02-22 (×4): 7 mL via OROMUCOSAL

## 2015-02-20 MED ORDER — SODIUM CHLORIDE 0.9 % IV SOLN: 100.0000 mL | INTRAVENOUS | Status: DC | PRN

## 2015-02-20 MED ORDER — SODIUM CHLORIDE 0.9 % IV SOLN: 250.0000 mL | INTRAVENOUS | Status: DC | PRN

## 2015-02-20 MED ORDER — SODIUM BICARBONATE 8.4 % IV SOLN
50.0000 meq | Freq: Once | INTRAVENOUS | Status: AC
  Administered 2015-02-20: 50 meq via INTRAVENOUS

## 2015-02-20 MED ORDER — INSULIN ASPART 100 UNIT/ML ~~LOC~~ SOLN
5.0000 [IU] | Freq: Once | SUBCUTANEOUS | Status: AC
  Administered 2015-02-20: 5 [IU] via INTRAVENOUS
  Filled 2015-02-20: qty 1

## 2015-02-20 MED ORDER — SULFAMETHOXAZOLE-TRIMETHOPRIM 400-80 MG PO TABS
1.0000 | ORAL_TABLET | ORAL | Status: DC
  Filled 2015-02-20 (×2): qty 1

## 2015-02-20 MED ORDER — LAMIVUDINE 10 MG/ML PO SOLN
25.0000 mg | Freq: Every day | ORAL | Status: DC
  Administered 2015-02-21 – 2015-02-22 (×2): 25 mg via ORAL
  Filled 2015-02-20 (×6): qty 5

## 2015-02-20 MED ORDER — ONDANSETRON 4 MG PO TBDP
4.0000 mg | ORAL_TABLET | Freq: Once | ORAL | Status: AC
  Administered 2015-02-20: 4 mg via ORAL
  Filled 2015-02-20: qty 1

## 2015-02-20 MED ORDER — DEXTROSE 50 % IV SOLN
1.0000 | Freq: Once | INTRAVENOUS | Status: AC
  Administered 2015-02-20: 50 mL via INTRAVENOUS

## 2015-02-20 MED ORDER — ONDANSETRON HCL 4 MG/2ML IJ SOLN
4.0000 mg | Freq: Four times a day (QID) | INTRAMUSCULAR | Status: DC | PRN
  Administered 2015-02-20: 4 mg via INTRAVENOUS
  Filled 2015-02-20: qty 2

## 2015-02-20 MED ORDER — ACETAMINOPHEN 325 MG PO TABS
650.0000 mg | ORAL_TABLET | Freq: Four times a day (QID) | ORAL | Status: DC | PRN
  Administered 2015-02-20: 650 mg via ORAL
  Filled 2015-02-20: qty 2

## 2015-02-20 MED ORDER — DEXTROSE 50 % IV SOLN
INTRAVENOUS | Status: AC
  Filled 2015-02-20: qty 50

## 2015-02-20 MED ORDER — SODIUM CHLORIDE 0.9 % IV SOLN
1.0000 g | Freq: Once | INTRAVENOUS | Status: AC
  Administered 2015-02-20: 1 g via INTRAVENOUS
  Filled 2015-02-20: qty 10

## 2015-02-20 MED ORDER — HEPARIN SODIUM (PORCINE) 5000 UNIT/ML IJ SOLN
5000.0000 [IU] | Freq: Three times a day (TID) | INTRAMUSCULAR | Status: DC
  Administered 2015-02-21 – 2015-02-22 (×6): 5000 [IU] via SUBCUTANEOUS
  Filled 2015-02-20 (×7): qty 1

## 2015-02-20 MED ORDER — PENTAFLUOROPROP-TETRAFLUOROETH EX AERO: 1.0000 "application " | INHALATION_SPRAY | CUTANEOUS | Status: DC | PRN

## 2015-02-20 MED ORDER — EPOETIN ALFA 2000 UNIT/ML IJ SOLN: 2000.0000 [IU] | INTRAMUSCULAR | Status: DC

## 2015-02-20 MED ORDER — HEPARIN SODIUM (PORCINE) 1000 UNIT/ML DIALYSIS: 20.0000 [IU]/kg | INTRAMUSCULAR | Status: DC | PRN

## 2015-02-20 MED ORDER — INSULIN ASPART 100 UNIT/ML ~~LOC~~ SOLN
5.0000 [IU] | Freq: Once | SUBCUTANEOUS | Status: AC
  Administered 2015-02-20: 5 [IU] via INTRAVENOUS

## 2015-02-20 MED ORDER — SEVELAMER CARBONATE 800 MG PO TABS
1600.0000 mg | ORAL_TABLET | Freq: Three times a day (TID) | ORAL | Status: DC
  Administered 2015-02-20 – 2015-02-22 (×5): 1600 mg via ORAL
  Filled 2015-02-20 (×5): qty 2

## 2015-02-20 MED ORDER — SODIUM BICARBONATE 8.4 % IV SOLN
INTRAVENOUS | Status: AC
  Filled 2015-02-20: qty 50

## 2015-02-20 MED ORDER — EPOETIN ALFA 4000 UNIT/ML IJ SOLN
INTRAMUSCULAR | Status: AC
  Administered 2015-02-20: 4000 [IU]
  Filled 2015-02-20: qty 1

## 2015-02-20 MED ORDER — LIDOCAINE-PRILOCAINE 2.5-2.5 % EX CREA: 1.0000 "application " | TOPICAL_CREAM | CUTANEOUS | Status: DC | PRN

## 2015-02-20 MED ORDER — INSULIN ASPART 100 UNIT/ML ~~LOC~~ SOLN
SUBCUTANEOUS | Status: AC
  Filled 2015-02-20: qty 1

## 2015-02-20 MED ORDER — SODIUM CHLORIDE 0.9 % IJ SOLN
3.0000 mL | Freq: Two times a day (BID) | INTRAMUSCULAR | Status: DC
  Administered 2015-02-21 (×2): 3 mL via INTRAVENOUS

## 2015-02-20 MED ORDER — SODIUM CHLORIDE 0.9 % IJ SOLN: 3.0000 mL | INTRAMUSCULAR | Status: DC | PRN

## 2015-02-20 MED ORDER — ALTEPLASE 2 MG IJ SOLR: 2.0000 mg | Freq: Once | INTRAMUSCULAR | Status: DC | PRN

## 2015-02-20 MED ORDER — DILTIAZEM HCL 60 MG PO TABS
60.0000 mg | ORAL_TABLET | Freq: Three times a day (TID) | ORAL | Status: DC
  Administered 2015-02-21 – 2015-02-22 (×6): 60 mg via ORAL
  Filled 2015-02-20 (×6): qty 1

## 2015-02-20 MED ORDER — LIDOCAINE HCL (PF) 1 % IJ SOLN: 5.0000 mL | INTRAMUSCULAR | Status: DC | PRN

## 2015-02-20 MED ORDER — ACETAMINOPHEN 650 MG RE SUPP: 650.0000 mg | Freq: Four times a day (QID) | RECTAL | Status: DC | PRN

## 2015-02-20 MED ORDER — HEPARIN SODIUM (PORCINE) 1000 UNIT/ML DIALYSIS: 1000.0000 [IU] | INTRAMUSCULAR | Status: DC | PRN

## 2015-02-20 MED ORDER — FENTANYL 50 MCG/HR TD PT72
50.0000 ug | MEDICATED_PATCH | TRANSDERMAL | Status: DC
  Administered 2015-02-20: 50 ug via TRANSDERMAL
  Filled 2015-02-20: qty 1

## 2015-02-20 MED ORDER — ZIDOVUDINE 100 MG PO CAPS
100.0000 mg | ORAL_CAPSULE | Freq: Three times a day (TID) | ORAL | Status: DC
  Administered 2015-02-20 – 2015-02-22 (×6): 100 mg via ORAL
  Filled 2015-02-20 (×16): qty 1

## 2015-02-20 MED ORDER — HYDROGEN PEROXIDE 3 % EX SOLN
CUTANEOUS | Status: AC
  Filled 2015-02-20: qty 473

## 2015-02-20 MED ORDER — RALTEGRAVIR POTASSIUM 400 MG PO TABS
400.0000 mg | ORAL_TABLET | Freq: Two times a day (BID) | ORAL | Status: DC
  Administered 2015-02-20 – 2015-02-22 (×5): 400 mg via ORAL
  Filled 2015-02-20 (×11): qty 1

## 2015-02-20 MED ORDER — SILDENAFIL CITRATE 20 MG PO TABS
20.0000 mg | ORAL_TABLET | Freq: Three times a day (TID) | ORAL | Status: DC
  Administered 2015-02-20 – 2015-02-22 (×6): 20 mg via ORAL
  Filled 2015-02-20 (×16): qty 1

## 2015-02-20 MED ORDER — SULFAMETHOXAZOLE-TRIMETHOPRIM 800-160 MG PO TABS
0.5000 | ORAL_TABLET | ORAL | Status: DC
  Administered 2015-02-20 – 2015-02-22 (×2): 0.5 via ORAL
  Filled 2015-02-20 (×2): qty 1

## 2015-02-20 NOTE — Consult Note (Addendum)
[name] MRN: [id number] DOB/AGE: 62-06-1950 62 y.o. Primary Care Physician:BEFEKADU,BELAYENH S, MD Admit date: 02/20/2015 Chief Complaint:  Chief Complaint  Patient presents with  . Shortness of Breath   HPI: Pt is  64 year old female with past medical hx of ESRD who came to ER with c/p dyspnea.   HPI dates back to past few days when pt started feeling short of breath.Pt is currently a resident of [facility] and the staff there noticed that she was progressively worseing andthey sent her to ER.  IN ER upon evaluation pt was found to be hyperkalemic with iStat reporting k of 8.5 Pt is currently on bipap. Pt c/o nausea Pt c/o decreased energy NO c/p chest pain  NOP c/o hematuria  No c/o syncope NO c/o fever/cough/chills      Past Medical History  Diagnosis Date  . Pulmonary HTN ([place])   . Hypertension   . Depression   . Insomnia   . Chronic diarrhea   . HIV (human immunodeficiency virus infection) ([place])   . C. difficile colitis 10/10/11  . Muscle weakness (generalized)   . 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 Anxiety   . Anemia   . ESRD on hemodialysis [facility])         Family History  Problem Relation Age of Onset  . Anesthesia problems Neg Hx   . Hypotension Neg Hx   . Malignant hyperthermia Neg Hx   . Pseudochol deficiency Neg Hx   NO hx of ESRD  Social History:  reports that she has quit smoking. She has never used smokeless tobacco. She reports that she does not drink alcohol or use illicit drugs.   Allergies:  Allergies  Allergen Reactions  . Latex Rash  . Penicillins Rash    Has patient had a PCN reaction causing immediate rash, facial/tongue/throat swelling, SOB or lightheadedness with hypotension: No Has patient had a PCN reaction causing severe rash involving mucus membranes or skin necrosis: No Has patient had a PCN reaction that required hospitalization No Has patient had a PCN reaction occurring within the last 10 years: No If all of  the above answers are "NO", then may proceed with Cephalosporin use.       (Not in a hospital admission)     [id number] from the symptoms mentioned above,there are no other symptoms referable to all systems reviewed.      Physical Exam: Vital signs in last 24 hours: Temp:  [97.7 F (36.5 C)] 97.7 F (36.5 C) (12/21 0832) Pulse Rate:  [66] 66 (12/21 0832) BP: (149)/(83) 149/83 mmHg (12/21 0832) SpO2:  [98 %] 98 % (12/21 0832) Weight:  [111 lb (50.349 kg)] 111 lb (50.349 kg) (12/21 0832) Weight change:     Intake/Output from previous day:       Physical Exam: General- pt is awake,alert, oriented to time place and person Resp- Moderate  REsp distress, Rhonchi +. Bipap in situ CVS- S1S2 regular in rate and rhythm GIT- BS+, soft, NT, ND EXT- trace LE Edema, NOCyanosis CNS- CN 2-12 grossly intact. Moving all 4 extremities Psych- normal mod and affect Access- AVF+, Swelling present in arm    Lab Results: CBC  Recent Labs  02/20/15 0848  WBC 7.4  HGB 10.8*  HCT 34.4*  PLT 140*    BMET  Recent Labs  02/20/15 0848  NA 136  K >7.5*  CL 100*  CO2 18*  GLUCOSE 121*  BUN 128*  CREATININE 18.34*  CALCIUM 8.6*  MICRO No results found for this or any previous visit (from the past 240 hour(s)).    Lab Results  Component Value Date   PTH 364.8* 03/12/2011   CALCIUM 8.6* 02/20/2015   CAION 0.97* 03/25/2014   PHOS 12.8* 03/27/2014      Impression: 1)Renal ESRD on HD              Pt is on MWF schedule              Pt last HD was on Wednesday              Pt has extensive hx of non adherence to medical tx              Pt usually missed 1-2 tx every week  2)HTN BP stable   3)Anemia HGb at goal (9--11) Will keep on epo   4)CKD Mineral-Bone Disorder PTH elevated  Secondary Hyperparathyroidism  present  Phosphorus not at goal.   5)ID- hx of HICV and hep  C  Primary  MD following  6)Electrolytes Hyperkalemic   NO adherence to dietary  and dialysis tx  NOrmonatremic   7)Acid base Co2 not at goal NON AG acidosis  sec to no adherence to tx   Plan:  Will dialyze toay Will use 1 k bath Will keep on epo Will try to take 3 liters off I did get in touch with dialysis RN, we do have machine ready. will dialyze ASAP.    Ignatz Deis S 02/20/2015, 9:41 AM

## 2015-02-20 NOTE — H&P (Signed)
CSW attempted contact with patient, but was advised that patient was at dialysis and would be there until around 4 p.m. CSW will assess patient on tomorrow.   Ihor Gully, [place] [phone]

## 2015-02-20 NOTE — ED Notes (Signed)
Per ems, pt's cbg 150.

## 2015-02-20 NOTE — Progress Notes (Signed)
Pt brought to dialysis room with ED staff.  Belongings placed in [place] where patient will come following dialysis.

## 2015-02-20 NOTE — ED Notes (Signed)
Pt placed on NRB per Dr. Rogene Houston.

## 2015-02-20 NOTE — ED Notes (Signed)
MD at bedside. 

## 2015-02-20 NOTE — ED Notes (Signed)
Dr. Rogene Houston notified that per [place], pt has not had dialysis in 1 week.

## 2015-02-20 NOTE — H&P (Signed)
History and Physical  [name] [id number] DOB: 11-29-1950 DOA: 02/20/2015  Referring physician: Fredia Sorrow, MD.  PCP: Harriett Sine, MD   Chief Complaint: SOB  HPI:  64 y/o female with a hx of ESRD, on HD M.W.F, anemia, HTN, HIV,  and CHF that presented from [facility] with SOB. Patient last received dialysis 12/14 and missed her Friday and Monday appointments because she felt "sick". While in the ED, she was found to be bradycardic, hyperkalemic >7.5, and with a creatinine of 18.34, BUN of 128, and GFR of 2. She was started on BIPAP in the ED. She was given an amp of bicarbonate, D50, 5U of regular insulin, and calcium gluconate with some improvement in her potassium. EDP spoke with nephrologist who will dialyze her today. She will be admitted to the ICU for further management.   Patient reports feeling SOB over the last several days that worsening this morning. She is unaware of any alleviating factors and has not taken anything for her symptoms PTA. She denies any HA, chest pain, fever, dysuria, diarrhea, visual changes, or rash. She has had a normal appetite. She reports missing dialysis on Friday and Monday due to having nausea, vomiting, and abdominal pain. Her GI symptoms have since resolved.    In the emergency department bradycardic, on BIPAP Pertinent labs: Potassium >7.5, Creatinine 18.34, BUN 128, WBC 7.4, Hgb 10.8, Glucose 121, Anion gap 18 EKG: Independently reviewed. 1st: junctional bradycardia, wide QRS, prolonged QT. 2nd: junctional bradycardia, marked, prolonged QT; 3rd: SB, marked, prolonged QT improved Imaging: independently reviewed CXR, no acute disease.  Review of Systems:  Positive for SOB, abdominal pain, nausea, and vomiting Negative for fever, visual changes, sore throat, rash, new muscle aches, chest pain, dysuria, bleeding   Past Medical History  Diagnosis Date  . Pulmonary HTN ([place])   . Hypertension   . Depression   . Insomnia   .  Chronic diarrhea   . HIV (human immunodeficiency virus infection) ([place])   . C. difficile colitis 10/10/11  . Muscle weakness (generalized)   . Intracranial hemorrhage ([place])   . Anxiety   . Anemia   . ESRD on hemodialysis [facility])     Past Surgical History  Procedure Laterality Date  . Dialysis shunts      previous one removed from left arm and now present one in right arm  . Esophagogastroduodenoscopy  12/15/2010    Procedure: ESOPHAGOGASTRODUODENOSCOPY (EGD);  Surgeon: Daneil Dolin, MD;  Location: [facility] ENDO SUITE;  Service: Endoscopy;  Laterality: N/A;  . Tonsillectomy    . Biopsy thyroid    . Excision of breast biopsy Right 05/04/2012    Procedure: EXCISION OF BREAST BIOPSY;  Surgeon: Donato Heinz, MD;  Location: [facility] ORS;  Service: General;  Laterality: Right;  Right Excisional Breast Biopsy  . Shuntogram Right 10/19/2013    Procedure: FISTULOGRAM;  Surgeon: Conrad [place], MD;  Location: [facility] CATH LAB;  Service: Cardiovascular;  Laterality: Right;  . Fistulogram Right 03/26/2014    Procedure: Right Arm Fistulogram with Venoplasty Right Subclavian Vein and Inominate Vein. Debridement Fistula Ulcer;  Surgeon: Conrad Bradford, MD;  Location: [place];  Service: Vascular;  Laterality: Right;    Social History:  reports that she has quit smoking. She has never used smokeless tobacco. She reports that she does not drink alcohol or use illicit drugs. lives in an assisted living facility Partial assistance  Allergies  Allergen Reactions  . Latex Rash  . Penicillins Rash  Has patient had a PCN reaction causing immediate rash, facial/tongue/throat swelling, SOB or lightheadedness with hypotension: No Has patient had a PCN reaction causing severe rash involving mucus membranes or skin necrosis: No Has patient had a PCN reaction that required hospitalization No Has patient had a PCN reaction occurring within the last 10 years: No If all of the above answers are "NO", then may proceed with  Cephalosporin use.      Family History  Problem Relation Age of Onset  . Anesthesia problems Neg Hx   . Hypotension Neg Hx   . Malignant hyperthermia Neg Hx   . Pseudochol deficiency Neg Hx      Prior to Admission medications   Medication Sig Start Date End Date Taking? Authorizing Provider  buPROPion (WELLBUTRIN XL) 150 MG 24 hr tablet Take 150 mg by mouth daily.   Yes Historical Provider, MD  diltiazem (CARDIZEM) 60 MG tablet Take 60 mg by mouth every 8 (eight) hours.   Yes Historical Provider, MD  epoetin alfa (EPOGEN,PROCRIT) 60454 UNIT/ML injection Inject 8,000 Units into the skin 3 (three) times a week. To be given at dialysis, Monday Wednesday and Friday   Yes Historical Provider, MD  fentaNYL (DURAGESIC - DOSED MCG/HR) 50 MCG/HR Apply one patch every 72 hours for pain. Remove old patch. External use only. Rotate sites. 06/27/14  Yes Estill Dooms, MD  hydroxypropyl methylcellulose (ISOPTO TEARS) 2.5 % ophthalmic solution Place 1 drop into both eyes 2 (two) times daily.     Yes Historical Provider, MD  lamiVUDine (EPIVIR) 10 MG/ML solution Take 2.5 mLs (25 mg total) by mouth daily. 03/29/14  Yes Virginia Crews, MD  lidocaine-prilocaine (EMLA) cream Apply 1 application topically 3 (three) times a week. Apply 1 hour prior to dialysis access site on Monday Wednesday and Friday   Yes Historical Provider, MD  LORazepam (ATIVAN) 0.5 MG tablet Take 0.5 tablets (0.25 mg total) by mouth every 6 (six) hours as needed for anxiety. 11/13/14  Yes Lauree Chandler, NP  metoprolol (LOPRESSOR) 50 MG tablet Take 50 mg by mouth 2 (two) times daily.   Yes Historical Provider, MD  multivitamin (RENA-VIT) TABS tablet Take 1 tablet by mouth at bedtime.   Yes Historical Provider, MD  omeprazole (PRILOSEC) 40 MG capsule Take 40 mg by mouth daily.  02/15/14  Yes Historical Provider, MD  ondansetron (ZOFRAN) 4 MG tablet Take 2 mg by mouth 3 (three) times daily. nausea   Yes Historical Provider, MD    oxyCODONE (OXY IR/ROXICODONE) 5 MG immediate release tablet Take 5 mg by mouth every 6 (six) hours as needed for moderate pain. For pain level 7-10.   Yes Historical Provider, MD  Oxycodone HCl 10 MG TABS Take 10 mg by mouth at bedtime.   Yes Historical Provider, MD  raltegravir (ISENTRESS) 400 MG tablet Take 400 mg by mouth 2 (two) times daily.    Yes Historical Provider, MD  sertraline (ZOLOFT) 100 MG tablet Take 100 mg by mouth daily.   Yes Historical Provider, MD  sevelamer carbonate (RENVELA) 800 MG tablet Take 1,600 mg by mouth 3 (three) times daily with meals.    Yes Historical Provider, MD  sildenafil (REVATIO) 20 MG tablet Take 20 mg by mouth 3 (three) times daily.    Yes Historical Provider, MD  sulfamethoxazole-trimethoprim (BACTRIM,SEPTRA) 400-80 MG per tablet Take 1 tablet by mouth 3 (three) times a week. 03/29/14  Yes Virginia Crews, MD  vitamin C (ASCORBIC ACID) 500 MG tablet Take  500 mg by mouth 2 (two) times daily.   Yes Historical Provider, MD  zaleplon (SONATA) 5 MG capsule Take one capsule by mouth every night at bedtime for rest 02/19/15  Yes Lauree Chandler, NP  zidovudine (RETROVIR) 100 MG capsule Take 100 mg by mouth 3 (three) times daily.     Yes Historical Provider, MD   Physical Exam: Filed Vitals:   02/20/15 0832 02/20/15 0950  BP: 149/83   Pulse: 66 53  Temp: 97.7 F (36.5 C)   TempSrc: Oral   Height: 5\' 1"  (1.549 m)   Weight: 50.349 kg (111 lb)   SpO2: 98% 98%    Bradycardic, on BIPAP.  General:  Appears calm and comfortable Eyes: PERRL, normal lids, irises & conjunctiva ENT: grossly normal hearing, lips appear unremarkable. Limited, on BIPAP Neck: appears unremarkable  Cardiovascular: RRR, no m/r/g. No LE edema. Telemetry: SR,  No arrythmias  Respiratory: CTA bilaterally, no w/r/r. Normal respiratory effort. Able to speak in full sentences Abdomen: soft, ntnd Skin: no rash or induration seen on limited exam Musculoskeletal: grossly normal tone  BUE/BLE Psychiatric: grossly normal mood and affect, speech fluent and appropriate Neurologic: grossly non-focal.  Wt Readings from Last 3 Encounters:  02/20/15 50.349 kg (111 lb)  08/30/14 50.349 kg (111 lb)  04/06/14 52.98 kg (116 lb 12.8 oz)    Labs on Admission:  Basic Metabolic Panel:  Recent Labs Lab 02/20/15 0848  NA 136  K >7.5*  CL 100*  CO2 18*  GLUCOSE 121*  BUN 128*  CREATININE 18.34*  CALCIUM 8.6*    Liver Function Tests:  Recent Labs Lab 02/20/15 0848  AST 27  ALT 24  ALKPHOS 77  BILITOT 0.7  PROT 8.5*  ALBUMIN 3.5     CBC:  Recent Labs Lab 02/20/15 0848  WBC 7.4  NEUTROABS 5.8  HGB 10.8*  HCT 34.4*  MCV 106.2*  PLT 140*     CBG:  Recent Labs Lab 02/20/15 0843 02/20/15 0949  GLUCAP 89 143*     Radiological Exams on Admission: Dg Chest Portable 1 View  02/20/2015  CLINICAL DATA:  Dialysis patient with shortness of breath, nausea and vomiting. EXAM: PORTABLE CHEST 1 VIEW COMPARISON:  03/25/2014 FINDINGS: Heart size is at the upper limits of normal. There is atherosclerosis of the aorta. There is chronic central vascular prominence and likely pulmonary venous hypertension. No frank edema. No effusions. No acute bone finding. IMPRESSION: Borderline cardiomegaly. Atherosclerosis of the aorta. Chronic central vascular prominence and pulmonary venous hypertension without frank edema. Electronically Signed   By: Nelson Chimes M.D.   On: 02/20/2015 08:49      Principal Problem:   Hyperkalemia Active Problems:   End stage renal disease (HCC)   HIV (human immunodeficiency virus infection) (H[facility])   Assessment/Plan 1. Acute resp failure on BiPAP in ED, secondary to missed HD x1 week. 2. Severe hyperkalemia, with EKG changes. Improved with insulin, sodium bicarbonate, calcium gluconate. 3. Marked bradycardia secondary to hyperkalemia. Hold diltiazem and metoprolol. 4. ESRD with AG metabolic acidosis/uremia, secondary to missed HD with  last 1 week ago. 5. HIV, VR undetectable 6. Anemia of CKD, stable 7. Chronic thrombocytopenia, stable     Critically ill. Plan admission to SDU. Emergent Dialysis which is expected to correct hyperkalemia and EKG changes.   HR now in 70s, sinus on telemetry. Bradycardia resolved with treatment of hyperkalemia. Pacer at bedside.  Continue on BIPAP now, suspect resolution of resp failure with correction in volume overload  Continue chronic medications  Anticipate 1-2 days in hospital  Code Status: Full   DVT prophylaxis:SCDs Family Communication: None, patient alert and seems to understand plan. Disposition Plan/Anticipated LOS: Anticipate 1-2 days in hospital  Time spent: 55 minutes  Murray Hodgkins, MD  [facility] Pager [phone] 02/20/2015, 10:30 AM    By signing my name below, I, Rosalie Doctor attest that this documentation has been prepared under the direction and in the presence of Murray Hodgkins, MD Electronically signed: Rosalie Doctor, Scribe. 02/20/2015 10:15am  I personally performed the services described in this documentation. All medical record entries made by the scribe were at my direction. I have reviewed the chart and agree that the record reflects my personal performance and is accurate and complete. Murray Hodgkins, MD

## 2015-02-20 NOTE — ED Notes (Signed)
Pt on 2L 02NC

## 2015-02-20 NOTE — Procedures (Signed)
   EMERGENT HEMODIALYSIS TREATMENT NOTE:  Right upper arm is edematous and tender to touch with a hematoma extending around to the triceps area.  Pt stated she missed last two outpatient dialysis sessions due to right arm pain s/p infiltration at her HD clinic.  AVF was cannulated (ante/retrograde) with 16g needles near the antecubital area. Room air spO2 89%, up to 94% with 2L O2.  Pt was dialyzed with low-potassium dialysate for 4.5 hours at Qb 300.  She experienced tachycardia 120-140 after 3h and 45 min of HD.  Ultrafiltration was discontinued at this point and Dr. Theador Hawthorne was notified. All blood was returned and hemostasis was achieved within 15 minutes.  Net UF 2.5 liters. Report called to Donneta Romberg, RN.  Rockwell Alexandria, RN, CDN

## 2015-02-20 NOTE — ED Notes (Signed)
Ems reports pt is a dialysis pt and last dialysis was last wed.  Pt c/o sob, nausea, vomiting, and abd pain since last Wednesday.

## 2015-02-20 NOTE — ED Notes (Signed)
hospitalist at bedside

## 2015-02-20 NOTE — ED Notes (Signed)
Critical potassium >7.5 reported to Dr. Rogene Houston.

## 2015-02-20 NOTE — ED Provider Notes (Signed)
CSN: [id number]     Arrival date & time 02/20/15  [id number] History  By signing my name below, I, Rayna Sexton, attest that this documentation has been prepared under the direction and in the presence of Fredia Sorrow, MD. Electronically Signed: Rayna Sexton, ED Scribe. 02/20/2015. 9:32 AM.   Chief Complaint  Patient presents with  . Shortness of Breath   Patient is a 64 y.o. female presenting with shortness of breath. The history is provided by the patient.  Shortness of Breath Associated symptoms: abdominal pain and vomiting   Associated symptoms: no chest pain, no fever, no headaches and no rash     HPI Comments: [name] is a 64 y.o. female with a hx of HIV and HTN who presents to the Emergency Department by ambulance due to SOB onset worse this morning but has been short of breath for several days. Patient last dialyzed on Wednesday normally receives dialysis Monday Wednesdays and Friday so she missed Friday and Monday. Patient states she feels short of breath. Last week she had nausea vomiting abdominal pain but denies it now.    Per EMS, she is a dialysis pt with her last appointment 1 week ago and pt's CBG is 150.  Past Medical History  Diagnosis Date  . Pulmonary HTN ([place])   . Hypertension   . Depression   . Insomnia   . Chronic diarrhea   . HIV (human immunodeficiency virus infection) ([place])   . C. difficile colitis 10/10/11  . Muscle weakness (generalized)   . Intracranial hemorrhage ([place])   . Anxiety   . Anemia   . ESRD on hemodialysis [facility])    Past Surgical History  Procedure Laterality Date  . Dialysis shunts      previous one removed from left arm and now present one in right arm  . Esophagogastroduodenoscopy  12/15/2010    Procedure: ESOPHAGOGASTRODUODENOSCOPY (EGD);  Surgeon: Daneil Dolin, MD;  Location: [facility] ENDO SUITE;  Service: Endoscopy;  Laterality: N/A;  . Tonsillectomy    . Biopsy thyroid    . Excision of breast biopsy Right 05/04/2012     Procedure: EXCISION OF BREAST BIOPSY;  Surgeon: Donato Heinz, MD;  Location: [facility] ORS;  Service: General;  Laterality: Right;  Right Excisional Breast Biopsy  . Shuntogram Right 10/19/2013    Procedure: FISTULOGRAM;  Surgeon: Conrad [place], MD;  Location: [facility] CATH LAB;  Service: Cardiovascular;  Laterality: Right;  . Fistulogram Right 03/26/2014    Procedure: Right Arm Fistulogram with Venoplasty Right Subclavian Vein and Inominate Vein. Debridement Fistula Ulcer;  Surgeon: Conrad [place], MD;  Location: [place];  Service: Vascular;  Laterality: Right;   Family History  Problem Relation Age of Onset  . Anesthesia problems Neg Hx   . Hypotension Neg Hx   . Malignant hyperthermia Neg Hx   . Pseudochol deficiency Neg Hx    Social History  Substance Use Topics  . Smoking status: Former Research scientist (life sciences)  . Smokeless tobacco: Never Used  . Alcohol Use: No   OB History    Gravida Para Term Preterm AB TAB SAB Ectopic Multiple Living   1 1 1       1      Review of Systems  Constitutional: Negative for fever.  HENT: Negative for congestion.   Eyes: Negative for redness.  Respiratory: Positive for shortness of breath.   Cardiovascular: Negative for chest pain.  Gastrointestinal: Positive for nausea, vomiting and abdominal pain.  Musculoskeletal: Positive for myalgias.  Skin: Negative for rash.  Neurological: Negative for headaches.  Hematological: Bruises/bleeds easily.  Psychiatric/Behavioral: Negative for confusion.  All other systems reviewed and are negative.  Allergies  Latex and Penicillins  Home Medications   Prior to Admission medications   Medication Sig Start Date End Date Taking? Authorizing Provider  buPROPion (WELLBUTRIN XL) 150 MG 24 hr tablet Take 150 mg by mouth daily.   Yes Historical Provider, MD  lamiVUDine (EPIVIR) 10 MG/ML solution Take 2.5 mLs (25 mg total) by mouth daily. 03/29/14  Yes Virginia Crews, MD  omeprazole (PRILOSEC) 40 MG capsule Take 40 mg by mouth daily.   02/15/14  Yes Historical Provider, MD  sulfamethoxazole-trimethoprim (BACTRIM,SEPTRA) 400-80 MG per tablet Take 1 tablet by mouth 3 (three) times a week. 03/29/14  Yes Virginia Crews, MD  diltiazem (CARDIZEM) 60 MG tablet Take 60 mg by mouth every 8 (eight) hours.    Historical Provider, MD  DULoxetine (CYMBALTA) 30 MG capsule Take 30 mg by mouth daily. Take with 60mg  capsule for total of 90 mg.    Historical Provider, MD  DULoxetine (CYMBALTA) 60 MG capsule Take 60 mg by mouth daily. Take with 30mg  capsule for a total of 90mg     Historical Provider, MD  epoetin alfa (EPOGEN,PROCRIT) 28413 UNIT/ML injection Inject 8,000 Units into the skin 3 (three) times a week. To be given at dialysis, Monday Wednesday and Friday    Historical Provider, MD  fentaNYL (DURAGESIC - DOSED MCG/HR) 50 MCG/HR Apply one patch every 72 hours for pain. Remove old patch. External use only. Rotate sites. 06/27/14   Estill Dooms, MD  hydroxypropyl methylcellulose (ISOPTO TEARS) 2.5 % ophthalmic solution Place 1 drop into both eyes 2 (two) times daily.      Historical Provider, MD  ipratropium-albuterol (DUONEB) 0.5-2.5 (3) MG/3ML SOLN Take 3 mLs by nebulization every 6 (six) hours as needed. For shortness of breath/ wheezing    Historical Provider, MD  lidocaine-prilocaine (EMLA) cream Apply 1 application topically 3 (three) times a week. Apply 1 hour prior to dialysis access site on Monday Wednesday and Friday    Historical Provider, MD  LORazepam (ATIVAN) 0.5 MG tablet Take 0.5 tablets (0.25 mg total) by mouth every 6 (six) hours as needed for anxiety. 11/13/14   Lauree Chandler, NP  metoprolol (LOPRESSOR) 50 MG tablet Take 50 mg by mouth 2 (two) times daily.    Historical Provider, MD  multivitamin (RENA-VIT) TABS tablet Take 1 tablet by mouth at bedtime.    Historical Provider, MD  ondansetron (ZOFRAN) 4 MG tablet Take 2 mg by mouth 3 (three) times daily. nausea    Historical Provider, MD  oxyCODONE (OXY IR/ROXICODONE)  5 MG immediate release tablet Take 5 mg by mouth every 6 (six) hours as needed for moderate pain.     Historical Provider, MD  raltegravir (ISENTRESS) 400 MG tablet Take 400 mg by mouth 2 (two) times daily.     Historical Provider, MD  sevelamer carbonate (RENVELA) 800 MG tablet Take 800-1,600 mg by mouth 3 (three) times daily with meals.     Historical Provider, MD  sildenafil (REVATIO) 20 MG tablet Take 20 mg by mouth 3 (three) times daily.     Historical Provider, MD  vancomycin 500 mg in sodium chloride 0.9 % 100 mL Inject 500 mg into the vein every dialysis. 03/29/14   Virginia Crews, MD  vitamin C (ASCORBIC ACID) 500 MG tablet Take 500 mg by mouth 2 (two) times daily.  Historical Provider, MD  zaleplon (SONATA) 5 MG capsule Take one capsule by mouth every night at bedtime for rest 02/19/15   Lauree Chandler, NP  zidovudine (RETROVIR) 100 MG capsule Take 100 mg by mouth 3 (three) times daily.      Historical Provider, MD   BP 149/83 mmHg  Pulse 66  Temp(Src) 97.7 F (36.5 C) (Oral)  Ht 5\' 1"  (1.549 m)  Wt 50.349 kg  BMI 20.98 kg/m2  SpO2 98% Physical Exam  Constitutional: She is oriented to person, place, and time. She appears well-developed and well-nourished.  HENT:  Head: Normocephalic and atraumatic.  Mouth/Throat: No oropharyngeal exudate.  Mucous membranes dry  Neck: Normal range of motion. No tracheal deviation present.  Cardiovascular: Intact distal pulses.   No murmur heard. Irregular bradycardic  Pulmonary/Chest: She is in respiratory distress. She has no wheezes. She has rales.  Abdominal: Soft. There is no tenderness.  Musculoskeletal: Normal range of motion. She exhibits no edema.  Old AV fistula left arm current AV fistula right arm with good thrill.  Neurological: She is alert and oriented to person, place, and time. No cranial nerve deficit. She exhibits normal muscle tone. Coordination normal.  Skin: Skin is warm and dry. She is not diaphoretic.   Psychiatric: She has a normal mood and affect. Her behavior is normal.  Nursing note and vitals reviewed.  ED Course  Procedures  DIAGNOSTIC STUDIES: Oxygen Saturation is 92% on [place] 2L/min, normal by my interpretation.    COORDINATION OF CARE: 9:32 AM Pt presents today due to SOB. Discussed next steps with pt and she agreed to plan.   Labs Review Labs Reviewed  CBC WITH DIFFERENTIAL/PLATELET - Abnormal; Notable for the following:    RBC 3.24 (*)    Hemoglobin 10.8 (*)    HCT 34.4 (*)    MCV 106.2 (*)    Platelets 140 (*)    All other components within normal limits  COMPREHENSIVE METABOLIC PANEL  CBG MONITORING, ED  I-STAT CHEM 8, ED   Results for orders placed or performed during the hospital encounter of 02/20/15  CBC with Differential  Result Value Ref Range   WBC 7.4 4.0 - 10.5 K/uL   RBC 3.24 (L) 3.87 - 5.11 MIL/uL   Hemoglobin 10.8 (L) 12.0 - 15.0 g/dL   HCT 34.4 (L) 36.0 - 46.0 %   MCV 106.2 (H) 78.0 - 100.0 fL   MCH 33.3 26.0 - 34.0 pg   MCHC 31.4 30.0 - 36.0 g/dL   RDW 14.9 11.5 - 15.5 %   Platelets 140 (L) 150 - 400 K/uL   Neutrophils Relative % 79 %   Neutro Abs 5.8 1.7 - 7.7 K/uL   Lymphocytes Relative 17 %   Lymphs Abs 1.3 0.7 - 4.0 K/uL   Monocytes Relative 4 %   Monocytes Absolute 0.3 0.1 - 1.0 K/uL   Eosinophils Relative 0 %   Eosinophils Absolute 0.0 0.0 - 0.7 K/uL   Basophils Relative 0 %   Basophils Absolute 0.0 0.0 - 0.1 K/uL  Comprehensive metabolic panel  Result Value Ref Range   Sodium 136 135 - 145 mmol/L   Potassium >7.5 (HH) 3.5 - 5.1 mmol/L   Chloride 100 (L) 101 - 111 mmol/L   CO2 18 (L) 22 - 32 mmol/L   Glucose, Bld 121 (H) 65 - 99 mg/dL   BUN 128 (H) 6 - 20 mg/dL   Creatinine, Ser 18.34 (H) 0.44 - 1.00 mg/dL   Calcium 8.6 (  L) 8.9 - 10.3 mg/dL   Total Protein 8.5 (H) 6.5 - 8.1 g/dL   Albumin 3.5 3.5 - 5.0 g/dL   AST 27 15 - 41 U/L   ALT 24 14 - 54 U/L   Alkaline Phosphatase 77 38 - 126 U/L   Total Bilirubin 0.7 0.3 - 1.2 mg/dL    GFR calc non Af Amer 2 (L) >60 mL/min   GFR calc Af Amer 2 (L) >60 mL/min   Anion gap 18 (H) 5 - 15  POC CBG, ED  Result Value Ref Range   Glucose-Capillary 89 65 - 99 mg/dL    Imaging Review Dg Chest Portable 1 View  02/20/2015  CLINICAL DATA:  Dialysis patient with shortness of breath, nausea and vomiting. EXAM: PORTABLE CHEST 1 VIEW COMPARISON:  03/25/2014 FINDINGS: Heart size is at the upper limits of normal. There is atherosclerosis of the aorta. There is chronic central vascular prominence and likely pulmonary venous hypertension. No frank edema. No effusions. No acute bone finding. IMPRESSION: Borderline cardiomegaly. Atherosclerosis of the aorta. Chronic central vascular prominence and pulmonary venous hypertension without frank edema. Electronically Signed   By: Nelson Chimes M.D.   On: 02/20/2015 08:49   I have personally reviewed and evaluated these images and lab results as part of my medical decision-making.   EKG Interpretation   Date/Time:  Wednesday February 20 2015 08:33:00 EST Ventricular Rate:  37 PR Interval:    QRS Duration: 161 QT Interval:  586 QTC Calculation: 460 R Axis:   -57 Text Interpretation:  Possible atrial arrhythmia Nonspecific IVCD with LAD  Borderline abnrm T, anterolateral leads ? hyperkalemia Confirmed by  Alin Chavira  MD, Louanne Calvillo [phone]) on 02/20/2015 8:37:09 AM      CRITICAL CARE Performed by: Fredia Sorrow Total critical care time: 45 minutes Critical care time was exclusive of separately billable procedures and treating other patients. Critical care was necessary to treat or prevent imminent or life-threatening deterioration. Critical care was time spent personally by me on the following activities: development of treatment plan with patient and/or surrogate as well as nursing, discussions with consultants, evaluation of patient's response to treatment, examination of patient, obtaining history from patient or surrogate, ordering and  performing treatments and interventions, ordering and review of laboratory studies, ordering and review of radiographic studies, pulse oximetry and re-evaluation of patient's condition.     MDM   Final diagnoses:  Hyperkalemia  Renal failure     I discussed with nephrologist they will come in to get her dialyzed. Patient now having respiratory difficulty will start on BiPAP. Patient also was clinically very concerning for hyperkalemia i-STAT was done but not crossing over which showed a potassium of 8.5. Prior to that patient had received calcium gluconate to stabilize the heart once the potassium was known patient received an amp of bicarbonate now is received an amp of D50 and 5 units of regular insulin was some narrowing of the QRS complexes. But still has a bradycardic type rhythm. Blood pressure has been fine. Initial chest x-ray showed no significant pulmonary edema but clinically patient struggling more debris could be that she is just wearing out so we'll start her on the BiPAP. Call out to hospitalist as well for admission.  Poorly hyperkalemia patient got the calcium gluconate, 1 amp of bicarbonate, 5 units of regular insulin with D50.  Patient given an additional 5 units of regular insulin blood sugar was checked just prior to that was 120 something.  Patient seems to be responding  to the measures to lower the potassium. Not sure exactly where it sat we know which is greater than 7.5 based on the complete metabolic panel. Patient also seems to be doing much better on the BiPAP for assistance with her breathing.  I personally performed the services described in this documentation, which was scribed in my presence. The recorded information has been reviewed and is accurate.      Fredia Sorrow, MD 02/20/15 1011

## 2015-02-20 NOTE — ED Notes (Signed)
Pt placed on zoll.

## 2015-02-20 NOTE — Progress Notes (Signed)
Pt returned to floor with new tachycardia.  MD notified, and orders given to perform 12-lead EKG.  MD notified of EKG completion, and will place orders.

## 2015-02-21 ENCOUNTER — Inpatient Hospital Stay (HOSPITAL_COMMUNITY): Payer: Medicare Other

## 2015-02-21 ENCOUNTER — Ambulatory Visit: Payer: Medicare Other | Admitting: Infectious Diseases

## 2015-02-21 DIAGNOSIS — I480 Paroxysmal atrial fibrillation: Secondary | ICD-10-CM

## 2015-02-21 DIAGNOSIS — I4892 Unspecified atrial flutter: Secondary | ICD-10-CM

## 2015-02-21 DIAGNOSIS — I10 Essential (primary) hypertension: Secondary | ICD-10-CM

## 2015-02-21 LAB — BASIC METABOLIC PANEL
ANION GAP: 17 — AB (ref 5–15)
BUN: 44 mg/dL — ABNORMAL HIGH (ref 6–20)
CO2: 26 mmol/L (ref 22–32)
Calcium: 9.1 mg/dL (ref 8.9–10.3)
Chloride: 93 mmol/L — ABNORMAL LOW (ref 101–111)
Creatinine, Ser: 9.15 mg/dL — ABNORMAL HIGH (ref 0.44–1.00)
GFR calc Af Amer: 5 mL/min — ABNORMAL LOW (ref >60)
GFR, EST NON AFRICAN AMERICAN: 4 mL/min — AB (ref >60)
GLUCOSE: 61 mg/dL — AB (ref 65–99)
POTASSIUM: 4.8 mmol/L (ref 3.5–5.1)
SODIUM: 136 mmol/L (ref 135–145)

## 2015-02-21 LAB — CBC
HEMATOCRIT: 32 % — AB (ref 36.0–46.0)
HEMOGLOBIN: 10.1 g/dL — AB (ref 12.0–15.0)
MCH: 33 pg (ref 26.0–34.0)
MCHC: 31.6 g/dL (ref 30.0–36.0)
MCV: 104.6 fL — ABNORMAL HIGH (ref 78.0–100.0)
Platelets: 109 10*3/uL — ABNORMAL LOW (ref 150–400)
RBC: 3.06 MIL/uL — ABNORMAL LOW (ref 3.87–5.11)
RDW: 14.8 % (ref 11.5–15.5)
WBC: 4.3 10*3/uL (ref 4.0–10.5)

## 2015-02-21 LAB — TSH: TSH: 3.701 u[IU]/mL (ref 0.350–4.500)

## 2015-02-21 LAB — MRSA PCR SCREENING: MRSA BY PCR: NEGATIVE

## 2015-02-21 MED ORDER — METOPROLOL TARTRATE 50 MG PO TABS
50.0000 mg | ORAL_TABLET | Freq: Two times a day (BID) | ORAL | Status: DC
  Administered 2015-02-21 – 2015-02-22 (×3): 50 mg via ORAL
  Filled 2015-02-21 (×3): qty 1

## 2015-02-21 MED ORDER — LORAZEPAM 0.5 MG PO TABS: 0.2500 mg | ORAL_TABLET | Freq: Four times a day (QID) | ORAL | Status: DC | PRN

## 2015-02-21 MED ORDER — SERTRALINE HCL 50 MG PO TABS
100.0000 mg | ORAL_TABLET | Freq: Every day | ORAL | Status: DC
  Administered 2015-02-21 – 2015-02-22 (×2): 100 mg via ORAL
  Filled 2015-02-21 (×2): qty 2

## 2015-02-21 MED ORDER — OXYCODONE HCL 5 MG PO TABS
5.0000 mg | ORAL_TABLET | Freq: Four times a day (QID) | ORAL | Status: DC | PRN
  Administered 2015-02-21 – 2015-02-22 (×3): 5 mg via ORAL
  Filled 2015-02-21 (×3): qty 1

## 2015-02-21 MED ORDER — CALCIUM CARBONATE ANTACID 500 MG PO CHEW
1.0000 | CHEWABLE_TABLET | Freq: Three times a day (TID) | ORAL | Status: DC | PRN
  Administered 2015-02-21: 200 mg via ORAL
  Filled 2015-02-21: qty 1

## 2015-02-21 NOTE — Consult Note (Signed)
CARDIOLOGY CONSULT NOTE   Patient ID: [name] MRN: [id number] DOB/AGE: 03/13/1950 64 y.o.  Admit Date: 02/20/2015 Referring Physician: [facility] Primary Physician: Harriett Sine, MD Consulting Cardiologist: Dorris Carnes Primary Cardiologist: New Reason for Consultation: New Onset Atrial flutter  Clinical Summary [name] is a 64 y.o.female with history of anemia, ESRD, hypertension, HIV, and CHF admitted with dyspnea from [facility].  Has been missing dialysis appointments due to feeling sick. She missed 3 sessions. She states she has been seen in the past by [facility] years ago, and was told her heart was "not right."  She admits to medication non-adherence because it makes her sick to her stomach.   On arrival to ER BP 149/83 HR 66, O2 Sat 98%, afebrile. Anemic on admission with Hgb 10.8, Hct 34.4, Potassium >7.5, [place] 18.34. CXR negative for CHF or pneumonia. Initially EKG demonstrated sinus bradycardia with prolonged QT interval HR in the 40's. She was treated with calcium gluconate, sodium bicarb, dextrose and insulin. Metoprolol and diltiazem are on hold. She was placed on BiPAP. She is receiving dialyses   On 02/20/2015 she had episode of atrial fib/flutter with rates of 120 bpm. She was started back on diltiazem 60 mg Q 6 hours and metoprolol 50 mg BID. Repeat EKG this am had a lot of motion artifact but telemetry reveals NSR with PAC's. Repeat labs demonstrate potassium of 4.8, creatinine of 9.15. She complains of GERD only and generalized weakness.   Review of documents on Care Everywhere demonstrates history of PSVT and hx of CVA. She was seen by [facility] cardiology last on 09/18/2014. Had cardiac cath demonstrating:  [place] arteries:  Left main: patent  Left anterior descending:  Large D1  Small D2  Left circumflex:  Normal Ramus  Small Om1  Normal OM2  Large OM3  Small PDA  Right coronary: non-dome  Allergies  Allergen Reactions  . Latex  Rash  . Penicillins Rash    Has patient had a PCN reaction causing immediate rash, facial/tongue/throat swelling, SOB or lightheadedness with hypotension: No Has patient had a PCN reaction causing severe rash involving mucus membranes or skin necrosis: No Has patient had a PCN reaction that required hospitalization No Has patient had a PCN reaction occurring within the last 10 years: No If all of the above answers are "NO", then may proceed with Cephalosporin use.      Medications Scheduled Medications: . antiseptic oral rinse  7 mL Mouth Rinse BID  . diltiazem  60 mg Oral 3 times per day  . epoetin (EPOGEN/PROCRIT) injection  2,000 Units Intravenous Q M,W,F-HD  . fentaNYL  50 mcg Transdermal Q72H  . heparin  5,000 Units Subcutaneous 3 times per day  . lamiVUDine  25 mg Oral Daily  . metoprolol  50 mg Oral BID  . raltegravir  400 mg Oral BID  . sertraline  100 mg Oral Daily  . sevelamer carbonate  1,600 mg Oral TID WC  . sildenafil  20 mg Oral TID  . sodium chloride  3 mL Intravenous Q12H  . sodium chloride  3 mL Intravenous Q12H  . sulfamethoxazole-trimethoprim  0.5 tablet Oral Q M,W,F  . zidovudine  100 mg Oral TID      PRN Medications: sodium chloride, sodium chloride, sodium chloride, acetaminophen **OR** acetaminophen, alteplase, calcium carbonate, heparin, heparin, lidocaine (PF), lidocaine-prilocaine, LORazepam, ondansetron (ZOFRAN) IV, oxyCODONE, pentafluoroprop-tetrafluoroeth, sodium chloride   Past Medical History  Diagnosis Date  . Pulmonary HTN ([place])   . Hypertension   .  Depression   . Insomnia   . Chronic diarrhea   . HIV (human immunodeficiency virus infection) ()   . C. difficile colitis 10/10/11  . Muscle weakness (generalized)   . Intracranial hemorrhage ([place])   . Anxiety   . Anemia   . ESRD on hemodialysis [facility])     Past Surgical History  Procedure Laterality Date  . Dialysis shunts      previous one removed from left arm and now present one  in right arm  . Esophagogastroduodenoscopy  12/15/2010    Procedure: ESOPHAGOGASTRODUODENOSCOPY (EGD);  Surgeon: Daneil Dolin, MD;  [facility] ENDO [facility];  Service: Endoscopy;  Laterality: N/A;  . Tonsillectomy    . Biopsy thyroid    . Excision of breast biopsy Right 05/04/2012    Procedure: EXCISION OF BREAST BIOPSY;  Surgeon: Donato Heinz, MD;  Location: [facility] ORS;  Service: General;  Laterality: Right;  Right Excisional Breast Biopsy  . Shuntogram Right 10/19/2013    Procedure: FISTULOGRAM;  Surgeon: Conrad Clever, MD;  Location: [facility] CATH LAB;  Service: Cardiovascular;  Laterality: Right;  . Fistulogram Right 03/26/2014    Procedure: Right Arm Fistulogram with Venoplasty Right Subclavian Vein and Inominate Vein. Debridement Fistula Ulcer;  Surgeon: Conrad [place], MD;  Location: [place];  Service: Vascular;  Laterality: Right;    Family History  Problem Relation Age of Onset  . Anesthesia problems Neg Hx   . Hypotension Neg Hx   . Malignant hyperthermia Neg Hx   . Pseudochol deficiency Neg Hx     Social History [name] reports that she has quit smoking. She has never used smokeless tobacco. [name] reports that she does not drink alcohol.  Review of Systems Complete review of systems are found to be negative unless outlined in H&P above.  Physical Examination Blood pressure 132/59, pulse 98, temperature 98.4 F (36.9 C), temperature source Oral, resp. rate 16, height 5\' 1"  (1.549 m), weight 110 lb 3.7 oz (50 kg), SpO2 91 %.  Intake/Output Summary (Last 24 hours) at 02/21/15 1212 Last data filed at 02/21/15 1000  Gross per 24 hour  Intake    273 ml  Output   2606 ml  Net  -2333 ml    Telemetry: NSR with PAC's   GEN: No acute distress  HEENT: Conjunctiva and lids normal, oropharynx clear with moist mucosa. Neck: Supple, no elevated JVP or carotid bruits, no thyromegaly. Lungs: Clear to auscultation, nonlabored breathing at rest. Cardiac: Regular rate and rhythm,  with occasional irregularity, no S3 or significant systolic murmur, no pericardial rub. Abdomen: Soft, nontender, no hepatomegaly, bowel sounds present, no guarding or rebound. Extremities: No pitting edema, distal pulses 2+.Dialysis fistula bilaterally with mild edema on the right with dressing.  Skin: Warm and dry. Musculoskeletal: No kyphosis. Neuropsychiatric: Alert and oriented x3, affect grossly appropriate.  Prior Cardiac Testing/Procedures 1. Echocardiogram 01/21/2009 Left ventricle: The cavity size was normal. Wall thickness was  increased in a pattern of mild LVH, somewhat more prominent in  the septum. There was mild concentric hypertrophy. Systolic  function was normal. The estimated ejection fraction was in the  range of 60% to 65%. 2. Ventricular septum: The contour showed diastolic flattening and  moderate systolic flattening. 3. Mitral valve: Systolic bowing without prolapse. 4. Left atrium: The atrium was mildly dilated. 5. Right ventricle: The cavity size was moderately dilated. Wall  thickness was normal. Systolic function was moderately to  severely reduced. 6. Right atrium: The atrium was  moderately dilated. 7. Atrial septum: The septum bowed from right to left, consistent  with increased right atrial pressure. 8. Pulmonary arteries: Systolic pressure was moderately increased.  PA peak pressure: 35mm Hg (S).  Cardiac Cath [facility] 09/15/2012 CORONARY ARTERIOGRAMS  Dominance: left  SA node origin: right  AV node origin: right  BranchStenosisLesion TypeTIMI Flow  ----------------------------------------------------  Left Circumflex  OM1(Small)  OM3(Large)  L PDA(Small)  Left Anterior Descending  D1(Large)  D2(Small)  Right Coronary Artery normal  Left Main normal  Left Circumflex normal  Left  Anterior Descendingnormal  Lab Results  Basic Metabolic Panel:  Recent Labs Lab 02/20/15 0848 02/21/15 0547  NA 136 136  K >7.5* 4.8  CL 100* 93*  CO2 18* 26  GLUCOSE 121* 61*  BUN 128* 44*  CREATININE 18.34* 9.15*  CALCIUM 8.6* 9.1    Liver Function Tests:  Recent Labs Lab 02/20/15 0848  AST 27  ALT 24  ALKPHOS 77  BILITOT 0.7  PROT 8.5*  ALBUMIN 3.5    CBC:  Recent Labs Lab 02/20/15 0848 02/21/15 0547  WBC 7.4 4.3  NEUTROABS 5.8  --   HGB 10.8* 10.1*  HCT 34.4* 32.0*  MCV 106.2* 104.6*  PLT 140* 109*    Radiology: Dg Chest Portable 1 View  02/20/2015  CLINICAL DATA:  Dialysis patient with shortness of breath, nausea and vomiting. EXAM: PORTABLE CHEST 1 VIEW COMPARISON:  03/25/2014 FINDINGS: Heart size is at the upper limits of normal. There is atherosclerosis of the aorta. There is chronic central vascular prominence and likely pulmonary venous hypertension. No frank edema. No effusions. No acute bone finding. IMPRESSION: Borderline cardiomegaly. Atherosclerosis of the aorta. Chronic central vascular prominence and pulmonary venous hypertension without frank edema. Electronically Signed   By: Nelson Chimes M.D.   On: 02/20/2015 08:49     ECG: Repeating EKG now. Last documented atrial fib/flutter with rate of 120 bpm.    Impression and Recommendations 1. Sinus bradycardia: Likely related to severe hyperkalemia in the setting of ESRD. Once potassium normalized with IV meds and dialysis, heart rate increased. Review of records from [place] demonstrated normal coronaries in 2014. No plans for ischemic testing.   2. Atrial fib/flutter with RVR: Resolved once placed back on diltiazem and metoprolol. Will repeat EKG, as one this am demonstrated a lot of motion artifact. Telemetry demonstrated NSR with PAC's. Continue current regimen. Will check echo,   3. Hypertension; Much better controlled with dialysis and reinstitution of CCB and BB.   4. ESRD:  Per Nephrology. Missed 3 sessions of dialysis. Now has had one dialysis treatment with planned dialysis tomorrow. Creatinine and potassium is improved.    Signed: Phill Myron. [place] NP [place]  02/21/2015, 12:12 PM  Patient seen and examined  She is napping and does not answer many questions  Appears comfortable  Currently in SR  BP 118/   Exam with minimal volume increase   Echo showed normal LVEF.   CHADSVASc score is 2  Should be on anticoagulation but not sure pt is a good candidate for this   Will continue to follow   Dorris Carnes

## 2015-02-21 NOTE — Progress Notes (Signed)
Subjective:  Feeling well, no complaints, hoping to go home  Objective: Vital signs in last 24 hours: Temp:  [97.3 F (36.3 C)-98.4 F (36.9 C)] 98.4 F (36.9 C) (12/22 0800) Pulse Rate:  [42-132] 85 (12/22 0700) Resp:  [12-23] 15 (12/22 0700) BP: (110-179)/(58-105) 154/78 mmHg (12/22 0700) SpO2:  [91 %-100 %] 91 % (12/22 0700) Weight:  [48.1 kg (106 lb 0.7 oz)-50.5 kg (111 lb 5.3 oz)] 50 kg (110 lb 3.7 oz) (12/22 0500) Weight change:   Intake/Output from previous day: 12/21 0701 - 12/22 0700 In: 123 [P.O.:120; I.V.:3] Out: 2606 [Emesis/NG output:60] Intake/Output this shift:   EXAM: General appearance:   Alert, in no apparent distress Resp:  CTA without rales, rhonchi, or wheezes Cardio:  RRR without murmur or rub GI:  +BS, soft and nontender Extremities:  No edema Access:  AVF @ RFA with + bruit  Lab Results:  Recent Labs  02/20/15 0848 02/21/15 0547  WBC 7.4 4.3  HGB 10.8* 10.1*  HCT 34.4* 32.0*  PLT 140* 109*   BMET:  Recent Labs  02/20/15 0848 02/21/15 0547  NA 136 136  K >7.5* 4.8  CL 100* 93*  CO2 18* 26  GLUCOSE 121* 61*  BUN 128* 44*  CREATININE 18.34* 9.15*  CALCIUM 8.6* 9.1  ALBUMIN 3.5  --    No results for input(s): PTH in the last 72 hours. Iron Studies: No results for input(s): IRON, TIBC, TRANSFERRIN, FERRITIN in the last 72 hours.  Studies/Results: No results found.   Assessment/Plan: 1. Hyperkalemia - K initially 8.5, sec to missed HD, 4.8 s/p HD yesterday. 2. Dyspnea - sec to missed HD; resolved s/p net UF 2546 ml yesterday. 3. ESRD - HD on MWF, but missed last Txs (12/19), previous 2 Txs were reduced sec to infiltration on 12/14 & early sign-off on 12/16. 4. Anemia - Hgb 10.1 on Epogen 2000 U per HD. 5. HTN - BP much better s/p HD, on Diltiazem & Metoprolol. 6. MBD - Ca 9.1, P usually poorly controlled, on Renvela. 7. HIV/Hepatitis C - on meds. Patient is stable and does not require dialysis today For hemodialysis in am   LOS: 1 day   LYLES,CHARLES 02/21/2015,9:59 AM

## 2015-02-21 NOTE — Clinical Social Work Note (Signed)
Clinical Social Work Assessment  Patient Details  Name: [name] MRN: [id number] Date of Birth: 11-30-1950  Date of referral:  02/21/15               Reason for consult:  Facility Placement                Permission sought to share information with:    Permission granted to share information::     Name::        Agency::     Relationship::     Contact Information:     Housing/Transportation Living arrangements for the past 2 months:  [place] of Information:  Patient Patient Interpreter Needed:  None Criminal Activity/Legal Involvement Pertinent to Current Situation/Hospitalization:  No - Comment as needed Significant Relationships:  Adult Children Lives with:  Facility Resident Do you feel safe going back to the place where you live?  Yes Need for family participation in patient care:  Yes (Comment)  Care giving concerns:  None identified.     Social Worker assessment / plan:  CSW met with patient who indicated that she had been a resident at [facility] for about 7-8 years. She stated that she uses a wheelchair and that staff assists her with bathing and using the bathroom. Patient stated that she is able to wash her face, feed herself and dress herself. She indicated that her family visits her infrequently.  She stated that she wanted to go back to the facility upon discharge. CSW spoke with Claiborne Billings at [facility]. Claiborne Billings confirmed patient's statements and advised that patient could return to the facility at discharge.   Employment status:  Disabled (Comment on whether or not currently receiving Disability) Insurance information:  Medicare, Medicaid In [place] PT Recommendations:  Not assessed at this time Information / Referral to community resources:     Patient/Family's Response to care:  Patient is agreeable to return to the facility at discharge.   Patient/Family's Understanding of and Emotional Response to Diagnosis, Current Treatment, and  Prognosis:  Patient has understanding of her diagnosis, treatment and prognosis and as a result she accepts that her care can be best provided in a skilled setting.   Emotional Assessment Appearance:  Appears stated age Attitude/Demeanor/Rapport:   (Cooperative) Affect (typically observed):  Calm Orientation:  Oriented to Self, Oriented to Place, Oriented to  Time, Oriented to Situation Alcohol / Substance use:  Not Applicable Psych involvement (Current and /or in the community):  No (Comment)  Discharge Needs  Concerns to be addressed:  Discharge Planning Concerns Readmission within the last 30 days:  No Current discharge risk:  None Barriers to Discharge:  No Barriers Identified   Ihor Gully, LCSW 02/21/2015, 11:48 AM [phone]

## 2015-02-21 NOTE — Progress Notes (Signed)
  Echocardiogram 2D Echocardiogram has been performed.  Jennette Dubin 02/21/2015, 1:56 PM

## 2015-02-21 NOTE — Progress Notes (Signed)
PROGRESS NOTE  [name] [id number] DOB: 11/27/1950 DOA: 02/20/2015 PCP: Harriett Sine, MD  Summary: 64 y/o female with a hx of ESRD, on HD M.W.F, anemia, HTN, HIV, and CHF that presented from [facility] with SOB. Patient last received dialysis 12/14 and missed her Friday and Monday appointments because she felt "sick". While in the ED, she was found to be bradycardic, hyperkalemic >7.5, and with a creatinine of 18.34, BUN of 128, and GFR of 2. She was started on BIPAP in the ED. She was given an amp of bicarbonate, D50, 5U of regular insulin, and calcium gluconate with some improvement in her potassium. EDP spoke with nephrologist who performed emergent dialysis 12/21. She was admitted to the ICU for further management.   Assessment/Plan: 1. Severe hyperkalemia, with EKG changes and bradycardia. Resolved with insulin, sodium bicarbonate, calcium gluconate, and HD. Potassium 4.8 today. 2.  Marked bradycardia secondary to hyperkalemia, resolved.  3. Atrial flutter overnight, denies hx of prior. Will order ECHO and check TSH. Will consult cardiology and continue to monitor on tele.  4. Acute resp failure without hypoxia or hypercapnia, initially requiring BIPAP in ED, resolved. Related to missed HD. Weaned off [place], now stable on RA.  5. ESRD with AG metabolic acidosis/uremia, secondary to missed HD with last 1 week ago. S/p HD 12/21 with removal of 2.5L.  6. RUE hematoma, appears stable. Related to dialysis last week. Fistula functioning well. Discussed with Dr. Lowanda Foster, plan supportive care, continue to use fistula. 7. HIV, VR undetectable 8. Anemia of CKD, stable 9. Chronic thrombocytopenia, stable   Overall improved with resolution of hyperkalemia and respiratory failure.   Resume BB, continue CCB. Given medical complexity, will consult cardiology for recommendations in regard to atrial flutter.  Anticipate discharge 12/23   Code Status: Full  DVT  prophylaxis:SCDs Family Communication: Discussed with patient who understands and has no concerns at this time. Disposition Plan: Monitor in ICU, discharge when improved.   Murray Hodgkins, MD  [facility]  Pager [phone] If 7PM-7AM, please contact night-coverage at [email or web address], password [facility] 02/21/2015, 6:16 AM  LOS: 1 day   Consultants:  Nephrology   Procedures:  HD 12/21 with removal of 2.5L  Antibiotics:  Bactrim 12/21>>  HPI/Subjective: Complains of pain in right arm. Breathing is doing better. Denies and N/V. Does not have much of an appetite.   Tele shows atrial flutter overnight.   Objective: Filed Vitals:   02/20/15 2200 02/20/15 2300 02/21/15 0000 02/21/15 0008  BP: 136/92 155/105  170/94  Pulse:      Temp:   97.9 F (36.6 C)   TempSrc:   Oral   Resp: 17 19    Height:      Weight:      SpO2:        Intake/Output Summary (Last 24 hours) at 02/21/15 0616 Last data filed at 02/21/15 0008  Gross per 24 hour  Intake    123 ml  Output   2606 ml  Net  -2483 ml     Filed Weights   02/20/15 0832 02/20/15 1120 02/20/15 1620  Weight: 50.349 kg (111 lb) 50.5 kg (111 lb 5.3 oz) 48.1 kg (106 lb 0.7 oz)    Exam:    VSS, afebrile, not hypoxic General:  Appears comfortable, calm. Cardiovascular:  Irregular, tachycardic, no murmur, rub or gallop. No lower extremity edema. Telemetry: Irregular Respiratory: Clear to auscultation bilaterally, no wheezes, rales or rhonchi. Normal respiratory effort. Abdomen: soft, ntnd Musculoskeletal: Swelling in RUE  bicep with bruising. Upper forearm swelling, AV fistula in right forearm with good thrill, right radial pulse 2+, hand well perfused  Psychiatric: grossly normal mood and affect, speech fluent and appropriate Neurologic: grossly non-focal.   New data reviewed:  Potassium 4.8  BUN 44, Creatinine improved 9.15  Anion gap 17  Hgb stable, platelets 109  Pertinent data since admission:  BUN 128,  Creatinine 18.34  Potassium >7.5  Anion gap 18  Hgb 10.8  Pending data:  none  Scheduled Meds: . antiseptic oral rinse  7 mL Mouth Rinse BID  . diltiazem  60 mg Oral 3 times per day  . epoetin (EPOGEN/PROCRIT) injection  2,000 Units Intravenous Q M,W,F-HD  . fentaNYL  50 mcg Transdermal Q72H  . heparin  5,000 Units Subcutaneous 3 times per day  . lamiVUDine  25 mg Oral Daily  . raltegravir  400 mg Oral BID  . sevelamer carbonate  1,600 mg Oral TID WC  . sildenafil  20 mg Oral TID  . sodium chloride  3 mL Intravenous Q12H  . sodium chloride  3 mL Intravenous Q12H  . sulfamethoxazole-trimethoprim  0.5 tablet Oral Q M,W,F  . zidovudine  100 mg Oral TID   Continuous Infusions:   Principal Problem:   Hyperkalemia Active Problems:   End stage renal disease ([place])   HIV (human immunodeficiency virus infection) ([place])   Time spent 25 minutes    By signing my name below, I, Rosalie Doctor attest that this documentation has been prepared under the direction and in the presence of Murray Hodgkins, MD Electronically signed: Rosalie Doctor, Scribe.  02/21/2015 9:25am   I personally performed the services described in this documentation. All medical record entries made by the scribe were at my direction. I have reviewed the chart and agree that the record reflects my personal performance and is accurate and complete. Murray Hodgkins, MD

## 2015-02-21 NOTE — NC FL2 (Signed)
[place] MEDICAID FL2 LEVEL OF CARE SCREENING TOOL     IDENTIFICATION  Patient Name: [name] Birthdate: 10/26/1950 Sex: female Admission Date (Current Location): 02/20/2015  [place] and [place] Number:  [place] [id number] [place] and Address:  [facility] [address], [place]      Provider Number: [id number]  Attending Physician Name and Address:  Samuella Cota, MD  Relative Name and Phone Number:       Current Level of Care: Hospital Recommended Level of Care: [place] Prior Approval Number:    Date Approved/Denied:   PASRR Number: [id number]  Discharge Plan: SNF    Current Diagnoses: Patient Active Problem List   Diagnosis Date Noted  . Hyperkalemia 02/20/2015  . Venous stenosis of right upper extremity 04/06/2014  . ESRD on dialysis ([place]) 04/06/2014  . HIV (human immunodeficiency virus infection) ([place])   . Essential hypertension   . 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 Arm swelling 10/10/2013  . Nausea with vomiting 12/15/2012  . CHF, chronic ([place]) 11/25/2012  . Atrial fibrillation ([place]) 05/02/2012  . Intracranial bleed ([place]) 12/28/2011  . Angioedema of lips 09/21/2011  . Cellulitis of breast 03/09/2011  . Erosive esophagitis 12/15/2010  . Mallory - Weiss tear 12/15/2010  . UGI bleed 12/13/2010  . DIARRHEA 04/12/2009  . Human immunodeficiency virus (HIV) disease ([place]) 11/15/2008  . ALCOHOL ABUSE 11/15/2008  . CHF 11/15/2008  . End stage renal disease ([place]) 11/15/2008  . PANCREATITIS, HX OF 11/15/2008  . TOBACCO USE, QUIT 11/15/2008  . PAIN IN JOINT, UPPER ARM 08/07/2008  . ABDOMINAL WALL HERNIA 04/26/2007  . BRONCHITIS, ACUTE 11/16/2006  . HYPOKALEMIA, HX OF 07/13/2006  . ANEMIA, NORMOCYTIC, CHRONIC 03/23/2006  . ANXIETY 03/23/2006  . DEPRESSION 03/23/2006  . Essential hypertension, malignant 03/23/2006  . GERD 03/23/2006  . PANCREATITIS 03/23/2006  . MASS, RIGHT AXILLA  03/23/2006  . HEADACHE 03/23/2006  . SYMPTOM, ENLARGEMENT, LYMPH NODES 03/23/2006  . SHINGLES, HX OF 03/23/2006  . HYSTERECTOMY, TOTAL, HX OF 03/23/2006    Orientation RESPIRATION BLADDER Height & Weight    Self, Time, Situation, Place  Normal Continent 5\' 1"  (154.9 cm) 110 lbs.  BEHAVIORAL SYMPTOMS/MOOD NEUROLOGICAL BOWEL NUTRITION STATUS      Continent Diet (Diet renal with fluid restriction. Fluid restriciton: 1216mL fluid; )  AMBULATORY STATUS COMMUNICATION OF NEEDS Skin    (Wheelchair. Not ambulatory) Verbally Normal                       Personal Care Assistance Level of Assistance  Bathing Bathing Assistance: Limited assistance         Functional Limitations Info             SPECIAL CARE FACTORS FREQUENCY                       Contractures      Additional Factors Info  Psychotropic (Full code)   Allergies Info: Latex, Penicillians, Psychotropic Info: Ativan, Zoloft         Current Medications (02/21/2015):  This is the current hospital active medication list Current Facility-Administered Medications  Medication Dose Route Frequency Provider Last Rate Last Dose  . 0.9 %  sodium chloride infusion  100 mL Intravenous PRN Manpreet S Bhutani, MD      . 0.9 %  sodium chloride infusion  100 mL Intravenous PRN Manpreet Toya Smothers, MD      . 0.9 %  sodium chloride infusion  250 mL Intravenous PRN Samuella Cota, MD      . acetaminophen (TYLENOL) tablet 650 mg  650 mg Oral Q6H PRN Samuella Cota, MD   650 mg at 02/20/15 1518   Or  . acetaminophen (TYLENOL) suppository 650 mg  650 mg Rectal Q6H PRN Samuella Cota, MD      . alteplase (CATHFLO ACTIVASE) injection 2 mg  2 mg Intracatheter Once PRN Liana Gerold, MD      . antiseptic oral rinse (CPC / CETYLPYRIDINIUM CHLORIDE 0.05%) solution 7 mL  7 mL Mouth Rinse BID Noreene Larsson, RN   7 mL at 02/21/15 1000  . calcium carbonate (TUMS - dosed in mg elemental calcium) chewable tablet 200 mg  of elemental calcium  1 tablet Oral TID PRN Samuella Cota, MD      . diltiazem (CARDIZEM) tablet 60 mg  60 mg Oral 3 times per day Samuella Cota, MD   60 mg at 02/21/15 0646  . epoetin alfa (EPOGEN,PROCRIT) injection 2,000 Units  2,000 Units Intravenous Q M,W,F-HD Liana Gerold, MD   2,000 Units at 02/20/15 1330  . fentaNYL (DURAGESIC - dosed mcg/hr) 50 mcg  50 mcg Transdermal Q72H Samuella Cota, MD   50 mcg at 02/20/15 1519  . heparin injection 1,000 Units  1,000 Units Dialysis PRN Manpreet Toya Smothers, MD      . heparin injection 1,000 Units  20 Units/kg Dialysis PRN Liana Gerold, MD      . heparin injection 5,000 Units  5,000 Units Subcutaneous 3 times per day Samuella Cota, MD   5,000 Units at 02/21/15 [phone]  . lamiVUDine (EPIVIR) 10 MG/ML solution 25 mg  25 mg Oral Daily Samuella Cota, MD   25 mg at 02/21/15 0830  . lidocaine (PF) (XYLOCAINE) 1 % injection 5 mL  5 mL Intradermal PRN Manpreet Toya Smothers, MD      . lidocaine-prilocaine (EMLA) cream 1 application  1 application Topical PRN Manpreet Toya Smothers, MD      . LORazepam (ATIVAN) tablet 0.25 mg  0.25 mg Oral Q6H PRN Samuella Cota, MD      . metoprolol (LOPRESSOR) tablet 50 mg  50 mg Oral BID Samuella Cota, MD   50 mg at 02/21/15 1045  . ondansetron (ZOFRAN) injection 4 mg  4 mg Intravenous Q6H PRN Samuella Cota, MD   4 mg at 02/20/15 1826  . oxyCODONE (Oxy IR/ROXICODONE) immediate release tablet 5 mg  5 mg Oral Q6H PRN Samuella Cota, MD   5 mg at 02/21/15 1047  . pentafluoroprop-tetrafluoroeth (GEBAUERS) aerosol 1 application  1 application Topical PRN Manpreet Toya Smothers, MD      . raltegravir (ISENTRESS) tablet 400 mg  400 mg Oral BID Samuella Cota, MD   400 mg at 02/21/15 [id number]  . sertraline (ZOLOFT) tablet 100 mg  100 mg Oral Daily Samuella Cota, MD   100 mg at 02/21/15 1044  . sevelamer carbonate (RENVELA) tablet 1,600 mg  1,600 mg Oral TID WC Samuella Cota, MD   1,600 mg at  02/21/15 1100  . sildenafil (REVATIO) tablet 20 mg  20 mg Oral TID Samuella Cota, MD   20 mg at 02/21/15 0830  . sodium chloride 0.9 % injection 3 mL  3 mL Intravenous Q12H Samuella Cota, MD   3 mL at 02/21/15 0831  . sodium chloride 0.9 % injection 3 mL  3 mL Intravenous Q12H Samuella Cota, MD   3 mL at 02/21/15 0831  . sodium chloride 0.9 % injection 3 mL  3 mL Intravenous PRN Samuella Cota, MD      . sulfamethoxazole-trimethoprim (BACTRIM DS,SEPTRA DS) 800-160 MG per tablet 0.5 tablet  0.5 tablet Oral Q M,W,F Samuella Cota, MD   0.5 tablet at 02/20/15 1728  . zidovudine (RETROVIR) capsule 100 mg  100 mg Oral TID Samuella Cota, MD   100 mg at 02/21/15 0830     Discharge Medications: Please see discharge summary for a list of discharge medications.  Relevant Imaging Results:  Relevant Lab Results:   Additional Information    Nidhi Jacome, Clydene Pugh, LCSW

## 2015-02-21 NOTE — Care Management Note (Signed)
Case Management Note  Patient Details  Name: [name] MRN: [id number] Date of Birth: 1950/08/16  Subjective/Objective:                  Pt admitted with hyperkalemia. Pt is from [facility]. Pt on HD MWF.   Action/Plan: Anticipate pt will return to SNF at [facility] is aware and following pt. No CM needs.   Expected Discharge Date:  02/22/15               Expected Discharge Plan:  Skilled Nursing Facility  In-House Referral:  Clinical Social Work  Discharge planning Services  CM Consult  Post Acute Care Choice:  NA Choice offered to:  NA  DME Arranged:    DME Agency:     HH Arranged:    [place] Agency:     Status of Service:  Completed, signed off  Medicare Important Message Given:    Date Medicare IM Given:    Medicare IM give by:    Date Additional Medicare IM Given:    Additional Medicare Important Message give by:     If discussed at [place] of Stay Meetings, dates discussed:    Additional Comments:  Sherald Barge, RN 02/21/2015, 11:51 AM

## 2015-02-21 NOTE — Progress Notes (Signed)
Pt transferred to room 322 with nursing staff. All belongings transferred with patient. Pt alert and oriented and without distress.

## 2015-02-22 ENCOUNTER — Encounter (HOSPITAL_COMMUNITY): Payer: Self-pay | Admitting: Family Medicine

## 2015-02-22 DIAGNOSIS — S40021A Contusion of right upper arm, initial encounter: Secondary | ICD-10-CM

## 2015-02-22 DIAGNOSIS — I4892 Unspecified atrial flutter: Secondary | ICD-10-CM

## 2015-02-22 HISTORY — DX: Unspecified atrial flutter: I48.92

## 2015-02-22 HISTORY — DX: Contusion of right upper arm, initial encounter: S40.021A

## 2015-02-22 LAB — BASIC METABOLIC PANEL
Anion gap: 14 (ref 5–15)
BUN: 60 mg/dL — ABNORMAL HIGH (ref 6–20)
CALCIUM: 8.8 mg/dL — AB (ref 8.9–10.3)
CHLORIDE: 94 mmol/L — AB (ref 101–111)
CO2: 28 mmol/L (ref 22–32)
CREATININE: 11.21 mg/dL — AB (ref 0.44–1.00)
GFR calc non Af Amer: 3 mL/min — ABNORMAL LOW (ref >60)
GFR, EST AFRICAN AMERICAN: 4 mL/min — AB (ref >60)
Glucose, Bld: 77 mg/dL (ref 65–99)
Potassium: 4.6 mmol/L (ref 3.5–5.1)
SODIUM: 136 mmol/L (ref 135–145)

## 2015-02-22 LAB — CBC
HCT: 28.8 % — ABNORMAL LOW (ref 36.0–46.0)
HEMOGLOBIN: 9.2 g/dL — AB (ref 12.0–15.0)
MCH: 33.3 pg (ref 26.0–34.0)
MCHC: 31.9 g/dL (ref 30.0–36.0)
MCV: 104.3 fL — ABNORMAL HIGH (ref 78.0–100.0)
Platelets: 119 10*3/uL — ABNORMAL LOW (ref 150–400)
RBC: 2.76 MIL/uL — AB (ref 3.87–5.11)
RDW: 14.5 % (ref 11.5–15.5)
WBC: 3.2 10*3/uL — ABNORMAL LOW (ref 4.0–10.5)

## 2015-02-22 LAB — PHOSPHORUS: PHOSPHORUS: 7.1 mg/dL — AB (ref 2.5–4.6)

## 2015-02-22 MED ORDER — EPOETIN ALFA 4000 UNIT/ML IJ SOLN
INTRAMUSCULAR | Status: AC
  Administered 2015-02-22: 2000 [IU]
  Filled 2015-02-22: qty 1

## 2015-02-22 MED ORDER — SODIUM CHLORIDE 0.9 % IV SOLN: 100.0000 mL | INTRAVENOUS | Status: DC | PRN

## 2015-02-22 MED ORDER — METOPROLOL TARTRATE 1 MG/ML IV SOLN
5.0000 mg | Freq: Once | INTRAVENOUS | Status: AC
  Administered 2015-02-22: 5 mg via INTRAVENOUS

## 2015-02-22 MED ORDER — METOPROLOL TARTRATE 1 MG/ML IV SOLN
INTRAVENOUS | Status: AC
Start: 2015-02-22 — End: 2015-02-22
  Administered 2015-02-22: 5 mg via INTRAVENOUS
  Filled 2015-02-22: qty 5

## 2015-02-22 MED ORDER — PENTAFLUOROPROP-TETRAFLUOROETH EX AERO: 1.0000 "application " | INHALATION_SPRAY | CUTANEOUS | Status: DC | PRN

## 2015-02-22 MED ORDER — LIDOCAINE HCL (PF) 1 % IJ SOLN: 5.0000 mL | INTRAMUSCULAR | Status: DC | PRN

## 2015-02-22 NOTE — Clinical Social Work Note (Signed)
CSW facilitated discharge.   CSW spoke with patient's daughter, Leomia Kinslow, and advised that patient was discharging today and that the facility would pick her up at 2:30 p.m.  CSW spoke with Claiborne Billings at [facility] and advised of patient's discharge. Claiborne Billings indicated that the facility driver would pick patient up at 2:30.   CSW sent clinical via hub.  CSW signing off.  Ihor Gully, [place] [phone]

## 2015-02-22 NOTE — Progress Notes (Signed)
Subjective: Patient is feeling much better. She doesn't have any difficulty breathing. The swelling of her hand is improving. .  Objective: Vital signs in last 24 hours: Temp:  [98.2 F (36.8 C)-99 F (37.2 C)] 99 F (37.2 C) (12/23 0441) Pulse Rate:  [69-98] 72 (12/23 0441) Resp:  [14-20] 16 (12/22 2118) BP: (115-161)/(57-83) 161/70 mmHg (12/23 0441) SpO2:  [91 %-100 %] 96 % (12/23 0700) Weight:  [106 lb 3.2 oz (48.172 kg)] 106 lb 3.2 oz (48.172 kg) (12/23 0441)  Intake/Output from previous day: 12/22 0701 - 12/23 0700 In: 270 [P.O.:270] Out: 0  Intake/Output this shift:     Recent Labs  02/20/15 0848 02/21/15 0547 02/22/15 0513  HGB 10.8* 10.1* 9.2*    Recent Labs  02/21/15 0547 02/22/15 0513  WBC 4.3 3.2*  RBC 3.06* 2.76*  HCT 32.0* 28.8*  PLT 109* 119*    Recent Labs  02/21/15 0547 02/22/15 0513  NA 136 136  K 4.8 4.6  CL 93* 94*  CO2 26 28  BUN 44* 60*  CREATININE 9.15* 11.21*  GLUCOSE 61* 77  CALCIUM 9.1 8.8*   No results for input(s): LABPT, INR in the last 72 hours.  Generally patient is alert in no apparent distress Chest is clear to auscultation Heart exam regular rate and rhythm no murmur Extremities: She doesn't have any edema. She has some swelling of her right upper arm above her fistula. An attempt was made to stick her and patient infiltrated on dialysis unit. Presently patient doesn't have any pain and according to her the swelling is better. Presently still seems to be swollen but nontender. Fistula is with very little bruit. And has been used without any issue. Assessment/Plan:  Problem #1 end-stage renal disease: She is status post hemodialysis yesterday. Presently she is asymptomatic. Today is her regular schedule. Patient is not able to come to the dialysis unit tomorrow. Problem #2 hyperkalemia: Her potassium has corrected Problem #3 anemia: Her hemoglobin has declined below her target goal Problem #4 fluid management: Patient at  this moment doesn't have significant sign of fluid overload Problem #5 metabolic bone disease: Her calcium is in range Problem #6 history of AIDS Plan: We'll dialyze patient 21/2  Hours today since she has received dialysis yesterday. Her next dialysis will be on Monday as an outpatient which is her regular schedule.   [name] S 02/22/2015, 10:17 AM

## 2015-02-22 NOTE — Progress Notes (Signed)
PROGRESS NOTE  [name] [id number] DOB: 1951/01/09 DOA: 02/20/2015 PCP: Harriett Sine, MD  Summary: 64 y/o female with a hx of ESRD, on HD M.W.F, anemia, HTN, HIV, and CHF that presented from [facility] with SOB. Patient last received dialysis 12/14 and missed her Friday and Monday appointments because she felt "sick". While in the ED, she was found to be bradycardic, hyperkalemic >7.5, and with a creatinine of 18.34, BUN of 128, and GFR of 2. She was started on BIPAP in the ED. She was given an amp of bicarbonate, D50, 5U of regular insulin, and calcium gluconate with some improvement in her potassium. EDP spoke with nephrologist who performed emergent dialysis 12/21. She was admitted to the ICU for further management.   Assessment/Plan: 1. Severe hyperkalemia, with EKG changes and bradycardia. Resolved with insulin, sodium bicarbonate, calcium gluconate, and HD. Potassium 4.6 today. 2. Marked bradycardia secondary to hyperkalemia, resolved.  3. Atrial flutter, new dx, now in SR. ECHO as below and check TSH. Cardiology consulted, input appreciated. CHADS VASC score 2. Consider anticoagulation as an outpatient once hematoma has resolved.  4. Acute resp failure without hypoxia or hypercapnia, initially requiring BIPAP in ED, resolved. Related to missed HD. Weaned off [place], now stable on RA.  5. ESRD with AG metabolic acidosis/uremia, secondary to missed HD with last 1 week ago. S/p HD 12/21 with removal of 2.5L. HD scheduled for today. Metabolic acidosis resolved. 6. RUE hematoma, stable. Related to dialysis last week. Fistula functioning well. Discussed with Dr. Lowanda Foster, plan supportive care, continue to use fistula. 7. HIV, VR undetectable 8. Anemia of CKD, stable 9. Chronic thrombocytopenia, stable   Overall doing well, plan short dialysis today. Return to SNF.   Will f/u with cardiology as an outpatient for atrial flutter.  Dr. Lowanda Foster will follow  hematoma.   Code Status: Full  DVT prophylaxis:SCDs Family Communication: No family at bedside. Disposition Plan: Discharge today.   Murray Hodgkins, MD  [facility]  Pager [phone] If 7PM-7AM, please contact night-coverage at [email or web address], password [facility] 02/22/2015, 7:08 AM  LOS: 2 days   Consultants:  Nephrology   Cardiology  Procedures:  HD 12/21 with removal of 2.5L  ECHO  Study Conclusions  - Left ventricle: The cavity size was normal. Wall thickness was normal. Systolic function was normal. The estimated ejection fraction was in the range of 60% to 65%. - Mitral valve: There was mild regurgitation. - Right atrium: The atrium was mildly dilated.  Antibiotics:  Bactrim 12/21>>  HPI/Subjective: Feels good. Some left arm pain. Breathing fine. Eating ok.  Objective: Filed Vitals:   02/21/15 1700 02/21/15 1800 02/21/15 2118 02/22/15 0441  BP: 125/73 141/70 128/57 161/70  Pulse:   69 72  Temp:   99 F (37.2 C) 99 F (37.2 C)  TempSrc:   Oral Oral  Resp: 14 20 16    Height:      Weight:    48.172 kg (106 lb 3.2 oz)  SpO2:   94% 96%    Intake/Output Summary (Last 24 hours) at 02/22/15 0708 Last data filed at 02/21/15 1600  Gross per 24 hour  Intake    270 ml  Output      0 ml  Net    270 ml     Filed Weights   02/20/15 1620 02/21/15 0500 02/22/15 0441  Weight: 48.1 kg (106 lb 0.7 oz) 50 kg (110 lb 3.7 oz) 48.172 kg (106 lb 3.2 oz)    Exam:  VSS, afebrile, not hypoxic General:  Appears calm and comfortable Cardiovascular: RRR, no m/r/g. No LE edema. Telemetry: SR, no arrhythmias  Respiratory: CTA bilaterally, no w/r/r. Normal respiratory effort. Musculoskeletal:Right hand, no edema. Well perfused. Fistula is prominent, thrill noted. Good radial pulse. Bruising to right upper arm with edema, no erythema.  Psychiatric: grossly normal mood and affect, speech fluent and appropriate  New data reviewed:  Potassium stable,  4.6  BUN 60, Creatinine 11.21  Anion gap improved, 14  Hgb stable, platelets 119  Scheduled Meds: . antiseptic oral rinse  7 mL Mouth Rinse BID  . diltiazem  60 mg Oral 3 times per day  . epoetin (EPOGEN/PROCRIT) injection  2,000 Units Intravenous Q M,W,F-HD  . fentaNYL  50 mcg Transdermal Q72H  . heparin  5,000 Units Subcutaneous 3 times per day  . lamiVUDine  25 mg Oral Daily  . metoprolol  50 mg Oral BID  . raltegravir  400 mg Oral BID  . sertraline  100 mg Oral Daily  . sevelamer carbonate  1,600 mg Oral TID WC  . sildenafil  20 mg Oral TID  . sodium chloride  3 mL Intravenous Q12H  . sodium chloride  3 mL Intravenous Q12H  . sulfamethoxazole-trimethoprim  0.5 tablet Oral Q M,W,F  . zidovudine  100 mg Oral TID   Continuous Infusions:   Principal Problem:   Hyperkalemia Active Problems:   End stage renal disease ([place])   HIV (human immunodeficiency virus infection) ([place])   By signing my name below, I, Rosalie Doctor attest that this documentation has been prepared under the direction and in the presence of Murray Hodgkins, MD Electronically signed: Rosalie Doctor, Scribe.  02/22/2015  I personally performed the services described in this documentation. All medical record entries made by the scribe were at my direction. I have reviewed the chart and agree that the record reflects my personal performance and is accurate and complete. Murray Hodgkins, MD

## 2015-02-22 NOTE — Care Management Important Message (Signed)
Important Message  Patient Details  Name: [name] MRN: [id number] Date of Birth: 06/18/1950   Medicare Important Message Given:  N/A - LOS <3 / Initial given by admissions    Joylene Draft, RN 02/22/2015, 11:10 AM

## 2015-02-22 NOTE — Procedures (Signed)
   HEMODIALYSIS TREATMENT NOTE:  2.5 hour heparin-free dialysis completed via left forearm AVF.  Upper arm still swollen and tender with hematoma extending to triceps region. Painful to touch; could not stand tourniquet to be applied. Goal NOT met:  NSR 70s with PACs for first 1.5 hours of HD session. Call then received from primary RN reporting A flutter 140.  Pt was asymptomatic.  UF rate and blood flow rate were decreased and Dr. Lowanda Foster was notified. UF was then discontinued. '5mg'$  IV Lopressor given at 1225 and, within 10 minutes, pt converted to NSR with PACs 70s again. Pt was asymptomatic and BP was stable throughout session. Po lopressor 50 mg given at end of HD. All blood was returned and hemostasis was achieved within 10 minutes.  Net UF 1L. Report called to Jomarie Longs, RN.  Rockwell [place], RN, CDN

## 2015-02-22 NOTE — Care Management Note (Signed)
Case Management Note  Patient Details  Name: [name] MRN: [id number] Date of Birth: February 19, 1951  Subjective/Objective:                    Action/Plan:   Expected Discharge Date:  02/22/15               Expected Discharge Plan:  Skilled Nursing Facility  In-House Referral:  Clinical Social Work  Discharge planning Services  CM Consult  Post Acute Care Choice:  NA Choice offered to:  NA  DME Arranged:    DME Agency:     HH Arranged:    [place] Agency:     Status of Service:  Completed, signed off  Medicare Important Message Given:    Date Medicare IM Given:    Medicare IM give by:    Date Additional Medicare IM Given:    Additional Medicare Important Message give by:     If discussed at [place] of Stay Meetings, dates discussed:    Additional Comments: Pt discharged back to [facility] today. No Cm needs noted. Christinia Gully Panther Burn, RN 02/22/2015, 11:10 AM

## 2015-02-22 NOTE — Discharge Summary (Signed)
Physician Discharge Summary  [name] [id number] DOB: Apr 01, 1950 DOA: 02/20/2015  PCP: Harriett Sine, MD  Admit date: 02/20/2015 Discharge date: 02/22/2015  Recommendations for Outpatient Follow-up:  1. Follow up RUE hematoma.  Follow-up Information    Follow up On 02/22/2015.      Follow up with [facility], MD.   Specialty:  Nephrology   Why:  at dialysis center   Contact information:   [address] [address] [phone]       Follow up with Jory Sims, NP.   Specialties:  Nurse Practitioner, Radiology, Cardiology   Why:  office will call with appointment   Contact information:   [place] [address] [phone]        Discharge Diagnoses:  1. Severe hyperkalemia, with EKG changes and bradycardia  2. Marked bradycardia secondary to hyperkalemia  3. Atrial flutter   4. Acute resp failure without hypoxia or hypercapnia  5. ESRD with AG metabolic acidosis/uremia,  6. RUE hematoma  7. HIV  8. Anemia of CKD  9. Chronic thrombocytopenia   Discharge Condition: Improved Disposition: Return to SNF  Diet recommendation: Heart healthy  Filed Weights   02/20/15 1620 02/21/15 0500 02/22/15 0441  Weight: 48.1 kg (106 lb 0.7 oz) 50 kg (110 lb 3.7 oz) 48.172 kg (106 lb 3.2 oz)    History of present illness:  89 yow with a hx of ESRD, on HD M.W.F, anemia, HTN, HIV, and CHF that presented from [facility] with SOB. Patient last received dialysis 12/14 and missed her Friday and Monday appointments because she felt "sick". While in the ED, she was found to be bradycardic, hyperkalemic >7.5, and with a creatinine of 18.34, BUN of 128, and GFR of 2. She was started on BIPAP in the ED. She was given an amp of bicarbonate, D50, 5U of regular insulin, and calcium gluconate with some improvement in her potassium. EDP spoke with nephrologist who performed emergent dialysis 12/21. She was admitted to the ICU for  further management.   Hospital Course:  Patient presented with severe hyperkalemia, with associated EKG changes and bradycardia, related to missed HD x1 week. She was given Insulin, sodium bicarbonate, calcium gluconate and emergent HD with significant improvement. Her potassium is now WNL. Her marked bradycardia resolved with resolution in her hyperkalemia. Atrial flutter was noted the night of 12/21 on telemetry. Cardiology was consulted and recommended an ECHO and possible anticoagulation. She was restarted on Diltiazem and Metoprolol and has since converted back into SR. Acute resp failure without hypoxia resolved following HD. She has attended two HD treatments during the course of admission with significant improvement. Also noted to have RUE hematoma present on admission, secondary to infiltration at HD prior to admission.  Individual issues as below:  1. Severe hyperkalemia, with EKG changes and bradycardia. Resolved with insulin, sodium bicarbonate, calcium gluconate, and HD. Potassium 4.6 today. 2. Marked bradycardia secondary to hyperkalemia, resolved.  3. Atrial flutter, new dx, now in SR. ECHO as below and check TSH. Cardiology consulted, input appreciated. CHADS VASC score 2. Consider anticoagulation as an outpatient once hematoma has resolved.  4. Acute resp failure without hypoxia or hypercapnia, initially requiring BIPAP in ED, resolved. Related to missed HD. Weaned off [place], now stable on RA.  5. ESRD with AG metabolic acidosis/uremia, secondary to missed HD with last 1 week ago. S/p HD 12/21 with removal of 2.5L. HD scheduled for today. Metabolic acidosis resolved. 6. RUE hematoma, stable. Related  to dialysis last week. Fistula functioning well. Discussed with Dr. Lowanda Foster, plan supportive care, continue to use fistula. 7. HIV, VR undetectable 8. Anemia of CKD, stable 9. Chronic thrombocytopenia, stable  Consultants:  Nephrology   Cardiology  Procedures:  HD 12/21 with  removal of 2.5L  ECHO  Study Conclusions  - Left ventricle: The cavity size was normal. Wall thickness was normal. Systolic function was normal. The estimated ejection fraction was in the range of 60% to 65%. - Mitral valve: There was mild regurgitation. - Right atrium: The atrium was mildly dilated.  Antibiotics:  Bactrim 12/21   Discharge Instructions     Current Discharge Medication List    CONTINUE these medications which have NOT CHANGED   Details  buPROPion (WELLBUTRIN XL) 150 MG 24 hr tablet Take 150 mg by mouth daily.    diltiazem (CARDIZEM) 60 MG tablet Take 60 mg by mouth every 8 (eight) hours.    epoetin alfa (EPOGEN,PROCRIT) 24401 UNIT/ML injection Inject 8,000 Units into the skin 3 (three) times a week. To be given at dialysis, Monday Wednesday and Friday    fentaNYL (DURAGESIC - DOSED MCG/HR) 50 MCG/HR Apply one patch every 72 hours for pain. Remove old patch. External use only. Rotate sites. Qty: 10 patch, Refills: 0    hydroxypropyl methylcellulose (ISOPTO TEARS) 2.5 % ophthalmic solution Place 1 drop into both eyes 2 (two) times daily.      lamiVUDine (EPIVIR) 10 MG/ML solution Take 2.5 mLs (25 mg total) by mouth daily. Qty: 240 mL, Refills: 0    lidocaine-prilocaine (EMLA) cream Apply 1 application topically 3 (three) times a week. Apply 1 hour prior to dialysis access site on Monday Wednesday and Friday    LORazepam (ATIVAN) 0.5 MG tablet Take 0.5 tablets (0.25 mg total) by mouth every 6 (six) hours as needed for anxiety. Qty: 60 tablet, Refills: 5    metoprolol (LOPRESSOR) 50 MG tablet Take 50 mg by mouth 2 (two) times daily.    multivitamin (RENA-VIT) TABS tablet Take 1 tablet by mouth at bedtime.    omeprazole (PRILOSEC) 40 MG capsule Take 40 mg by mouth daily.     ondansetron (ZOFRAN) 4 MG tablet Take 2 mg by mouth 3 (three) times daily. nausea    !! oxyCODONE (OXY IR/ROXICODONE) 5 MG immediate release tablet Take 5 mg by mouth every 6  (six) hours as needed for moderate pain. For pain level 7-10.    !! Oxycodone HCl 10 MG TABS Take 10 mg by mouth at bedtime.    raltegravir (ISENTRESS) 400 MG tablet Take 400 mg by mouth 2 (two) times daily.     sertraline (ZOLOFT) 100 MG tablet Take 100 mg by mouth daily.    sevelamer carbonate (RENVELA) 800 MG tablet Take 1,600 mg by mouth 3 (three) times daily with meals.     sildenafil (REVATIO) 20 MG tablet Take 20 mg by mouth 3 (three) times daily.     sulfamethoxazole-trimethoprim (BACTRIM,SEPTRA) 400-80 MG per tablet Take 1 tablet by mouth 3 (three) times a week. Qty: 30 tablet, Refills: 0    vitamin C (ASCORBIC ACID) 500 MG tablet Take 500 mg by mouth 2 (two) times daily.    zaleplon (SONATA) 5 MG capsule Take one capsule by mouth every night at bedtime for rest Qty: 30 capsule, Refills: 0    zidovudine (RETROVIR) 100 MG capsule Take 100 mg by mouth 3 (three) times daily.       !! - Potential duplicate medications found. Please  discuss with provider.     Allergies  Allergen Reactions  . Latex Rash  . Penicillins Rash    Has patient had a PCN reaction causing immediate rash, facial/tongue/throat swelling, SOB or lightheadedness with hypotension: No Has patient had a PCN reaction causing severe rash involving mucus membranes or skin necrosis: No Has patient had a PCN reaction that required hospitalization No Has patient had a PCN reaction occurring within the last 10 years: No If all of the above answers are "NO", then may proceed with Cephalosporin use.      The results of significant diagnostics from this hospitalization (including imaging, microbiology, ancillary and laboratory) are listed below for reference.    Significant Diagnostic Studies: Dg Chest Portable 1 View  02/20/2015  CLINICAL DATA:  Dialysis patient with shortness of breath, nausea and vomiting. EXAM: PORTABLE CHEST 1 VIEW COMPARISON:  03/25/2014 FINDINGS: Heart size is at the upper limits of  normal. There is atherosclerosis of the aorta. There is chronic central vascular prominence and likely pulmonary venous hypertension. No frank edema. No effusions. No acute bone finding. IMPRESSION: Borderline cardiomegaly. Atherosclerosis of the aorta. Chronic central vascular prominence and pulmonary venous hypertension without frank edema. Electronically Signed   By: Nelson Chimes M.D.   On: 02/20/2015 08:49    Microbiology: Recent Results (from the past 240 hour(s))  MRSA PCR Screening     Status: None   Collection Time: 02/20/15  4:45 PM  Result Value Ref Range Status   MRSA by PCR NEGATIVE NEGATIVE Final    Comment:        The GeneXpert MRSA Assay (FDA approved for NASAL specimens only), is one component of a comprehensive MRSA colonization surveillance program. It is not intended to diagnose MRSA infection nor to guide or monitor treatment for MRSA infections.      Labs: Basic Metabolic Panel:  Recent Labs Lab 02/20/15 0848 02/21/15 0547 02/22/15 0513  NA 136 136 136  K >7.5* 4.8 4.6  CL 100* 93* 94*  CO2 18* 26 28  GLUCOSE 121* 61* 77  BUN 128* 44* 60*  CREATININE 18.34* 9.15* 11.21*  CALCIUM 8.6* 9.1 8.8*  PHOS  --   --  7.1*   Liver Function Tests:  Recent Labs Lab 02/20/15 0848  AST 27  ALT 24  ALKPHOS 77  BILITOT 0.7  PROT 8.5*  ALBUMIN 3.5    CBC:  Recent Labs Lab 02/20/15 0848 02/21/15 0547 02/22/15 0513  WBC 7.4 4.3 3.2*  NEUTROABS 5.8  --   --   HGB 10.8* 10.1* 9.2*  HCT 34.4* 32.0* 28.8*  MCV 106.2* 104.6* 104.3*  PLT 140* 109* 119*    CBG:  Recent Labs Lab 02/20/15 0843 02/20/15 0949  GLUCAP 89 143*    Principal Problem:   Hyperkalemia Active Problems:   ANEMIA, NORMOCYTIC, CHRONIC   End stage renal disease (H[facility])   HIV (human immunodeficiency virus infection) ([facility])   Atrial flutter ([facility])   Traumatic hematoma of right upper arm   Time coordinating discharge: 25 minutes  Signed:  Murray Hodgkins, MD [facility] 02/22/2015, 10:16 AM   By signing my name below, I, Rosalie Doctor attest that this documentation has been prepared under the direction and in the presence of Murray Hodgkins, MD Electronically signed: Rosalie Doctor, Scribe. 02/22/2015   I personally performed the services described in this documentation. All medical record entries made by the scribe were at my direction. I have reviewed the chart and agree that the record  reflects my personal performance and is accurate and complete. Murray Hodgkins, MD

## 2015-02-22 NOTE — Progress Notes (Addendum)
Patient returned from dialysis. Alert and oriented. Vital signs stable. Heart rate has returned to SR 82 w/ PVC's. Discussed with Dr. Sarajane Jews. May continue with discharge home. Report called to Doristine Johns at [facility]. Discussed patients medications and that cardizem and lopressor should be given mornings of dialysis before dialysis as per Dr. Sarajane Jews recommendation. Patient ready for transport.

## 2015-02-22 NOTE — Progress Notes (Signed)
Patient running 135-140 aflutter on monitor. In dialysis at this time. Levada Dy in dialysis notified and patient has no complaints. Stated she feels fine. Dr. Sarajane Jews notified via text page

## 2015-02-25 DIAGNOSIS — Z992 Dependence on renal dialysis: Secondary | ICD-10-CM | POA: Diagnosis not present

## 2015-02-25 DIAGNOSIS — D631 Anemia in chronic kidney disease: Secondary | ICD-10-CM | POA: Diagnosis not present

## 2015-02-25 DIAGNOSIS — N2581 Secondary hyperparathyroidism of renal origin: Secondary | ICD-10-CM | POA: Diagnosis not present

## 2015-02-25 DIAGNOSIS — N186 End stage renal disease: Secondary | ICD-10-CM | POA: Diagnosis not present

## 2015-02-25 DIAGNOSIS — T82898A Other specified complication of vascular prosthetic devices, implants and grafts, initial encounter: Secondary | ICD-10-CM | POA: Diagnosis not present

## 2015-02-25 DIAGNOSIS — D509 Iron deficiency anemia, unspecified: Secondary | ICD-10-CM | POA: Diagnosis not present

## 2015-02-26 ENCOUNTER — Other Ambulatory Visit: Payer: Self-pay | Admitting: *Deleted

## 2015-02-26 MED ORDER — OXYCODONE HCL 5 MG PO TABS: ORAL_TABLET | ORAL | Status: DC

## 2015-02-26 NOTE — Telephone Encounter (Signed)
[facility] 

## 2015-02-27 ENCOUNTER — Other Ambulatory Visit: Payer: Self-pay

## 2015-02-27 ENCOUNTER — Telehealth: Payer: Self-pay

## 2015-02-27 DIAGNOSIS — D509 Iron deficiency anemia, unspecified: Secondary | ICD-10-CM | POA: Diagnosis not present

## 2015-02-27 DIAGNOSIS — Z992 Dependence on renal dialysis: Secondary | ICD-10-CM | POA: Diagnosis not present

## 2015-02-27 DIAGNOSIS — T82898A Other specified complication of vascular prosthetic devices, implants and grafts, initial encounter: Secondary | ICD-10-CM | POA: Diagnosis not present

## 2015-02-27 DIAGNOSIS — N186 End stage renal disease: Secondary | ICD-10-CM | POA: Diagnosis not present

## 2015-02-27 DIAGNOSIS — D631 Anemia in chronic kidney disease: Secondary | ICD-10-CM | POA: Diagnosis not present

## 2015-02-27 DIAGNOSIS — T82510A Breakdown (mechanical) of surgically created arteriovenous fistula, initial encounter: Secondary | ICD-10-CM

## 2015-02-27 DIAGNOSIS — N2581 Secondary hyperparathyroidism of renal origin: Secondary | ICD-10-CM | POA: Diagnosis not present

## 2015-02-27 DIAGNOSIS — Z4931 Encounter for adequacy testing for hemodialysis: Secondary | ICD-10-CM

## 2015-02-27 NOTE — Telephone Encounter (Signed)
Discussed reported symptoms with Dr. Kellie Simmering.  Recommended to schedule a duplex of right arm AVF and office appt. on a non-dialysis day.  Will contact the kidney center re: appt.

## 2015-02-27 NOTE — Telephone Encounter (Signed)
rec'd phone call from Ramiro Harvest, [place] [facility] / Linna Hoff.  Reported pt. Had an infiltration of the right arm AVF approx. 2 weeks ago.  Stated there is localized swelling, tenderness and a darker discoloration in right arm,  just above the antecubital space.  Denied any swelling in the right shoulder area.  Reported that the area is noticeably warmer that the other areas of the arm.  Denied any erythema.  Denied fever / chills.  Voice concern that it may be a hematoma.  Advised will check with MD re: need for any vascular studies, and return call to the dialysis center with appt. Info.  Agrees with plan.

## 2015-02-27 NOTE — Telephone Encounter (Signed)
Spoke with RN at [facility] who will arrange transportation with the nursing facility, dpm

## 2015-02-28 ENCOUNTER — Other Ambulatory Visit: Payer: Self-pay | Admitting: Vascular Surgery

## 2015-02-28 ENCOUNTER — Other Ambulatory Visit: Payer: Self-pay

## 2015-02-28 ENCOUNTER — Ambulatory Visit (HOSPITAL_COMMUNITY)
Admission: RE | Admit: 2015-02-28 | Discharge: 2015-02-28 | Disposition: A | Discharge status: Home / Self Care | Payer: Medicare Other | Source: Ambulatory Visit | Attending: Vascular Surgery | Admitting: Vascular Surgery

## 2015-02-28 DIAGNOSIS — T82510A Breakdown (mechanical) of surgically created arteriovenous fistula, initial encounter: Secondary | ICD-10-CM

## 2015-02-28 DIAGNOSIS — Y832 Surgical operation with anastomosis, bypass or graft as the cause of abnormal reaction of the patient, or of later complication, without mention of misadventure at the time of the procedure: Secondary | ICD-10-CM | POA: Diagnosis not present

## 2015-02-28 DIAGNOSIS — G894 Chronic pain syndrome: Secondary | ICD-10-CM | POA: Diagnosis not present

## 2015-02-28 DIAGNOSIS — R936 Abnormal findings on diagnostic imaging of limbs: Secondary | ICD-10-CM | POA: Insufficient documentation

## 2015-02-28 MED ORDER — FENTANYL 50 MCG/HR TD PT72: MEDICATED_PATCH | TRANSDERMAL | Status: DC

## 2015-02-28 MED ORDER — LORAZEPAM 0.5 MG PO TABS: 0.2500 mg | ORAL_TABLET | Freq: Four times a day (QID) | ORAL | Status: DC | PRN

## 2015-03-01 ENCOUNTER — Encounter: Payer: Self-pay | Admitting: Vascular Surgery

## 2015-03-02 DIAGNOSIS — Z992 Dependence on renal dialysis: Secondary | ICD-10-CM | POA: Diagnosis not present

## 2015-03-02 DIAGNOSIS — N186 End stage renal disease: Secondary | ICD-10-CM | POA: Diagnosis not present

## 2015-03-04 DIAGNOSIS — N2581 Secondary hyperparathyroidism of renal origin: Secondary | ICD-10-CM | POA: Diagnosis not present

## 2015-03-04 DIAGNOSIS — D509 Iron deficiency anemia, unspecified: Secondary | ICD-10-CM | POA: Diagnosis not present

## 2015-03-04 DIAGNOSIS — Z992 Dependence on renal dialysis: Secondary | ICD-10-CM | POA: Diagnosis not present

## 2015-03-04 DIAGNOSIS — D631 Anemia in chronic kidney disease: Secondary | ICD-10-CM | POA: Diagnosis not present

## 2015-03-04 DIAGNOSIS — N186 End stage renal disease: Secondary | ICD-10-CM | POA: Diagnosis not present

## 2015-03-05 ENCOUNTER — Encounter: Payer: Self-pay | Admitting: Vascular Surgery

## 2015-03-05 ENCOUNTER — Ambulatory Visit (INDEPENDENT_AMBULATORY_CARE_PROVIDER_SITE_OTHER): Payer: Medicare Other | Admitting: Vascular Surgery

## 2015-03-05 VITALS — BP 126/69 | HR 78 | Temp 99.8°F | Resp 20 | Ht 62.0 in | Wt 106.5 lb

## 2015-03-05 DIAGNOSIS — N186 End stage renal disease: Secondary | ICD-10-CM

## 2015-03-05 DIAGNOSIS — Z992 Dependence on renal dialysis: Secondary | ICD-10-CM | POA: Diagnosis not present

## 2015-03-05 NOTE — Progress Notes (Addendum)
Vascular and Vein Specialist of [facility]  Patient name: [name] MRN: [id number] DOB: 1951-01-15 Sex: female  REASON FOR VISIT:  Right upper arm swelling  HPI: [name] is a 65 y.o. female who presents with right upper arm swelling and tenderness above antecubital space   following infiltration with dialysis  2 weeks ago.  She previously had venoplasty and debridement of an ulcer of her right radiocephalic fistula by Dr. Bridgett Larsson on 03/26/2014. She has never been stuck above the antecubital space.  She reports the area as uncomfortable and painful.  He denies any fever or chills.  She dialyzes on Mondays, Wednesdays and Fridays via her fistula distally.  She denies any bleeding problems.  Past Medical History  Diagnosis Date  . Pulmonary HTN ([place])   . Hypertension   . Depression   . Insomnia   . Chronic diarrhea   . HIV (human immunodeficiency virus infection) ([place])   . C. difficile colitis 10/10/11  . Muscle weakness (generalized)   . Intracranial hemorrhage ([place])   . Anxiety   . Anemia   . ESRD on hemodialysis ([place])   . Atrial flutter ([place]) 02/22/2015  . Traumatic hematoma of right upper arm 02/22/2015    Family History  Problem Relation Age of Onset  . Anesthesia problems Neg Hx   . Hypotension Neg Hx   . Malignant hyperthermia Neg Hx   . Pseudochol deficiency Neg Hx     SOCIAL HISTORY: Social History  Substance Use Topics  . Smoking status: Former Research scientist (life sciences)  . Smokeless tobacco: Never Used  . Alcohol Use: No    Allergies  Allergen Reactions  . Latex Rash  . Penicillins Rash    Has patient had a PCN reaction causing immediate rash, facial/tongue/throat swelling, SOB or lightheadedness with hypotension: No Has patient had a PCN reaction causing severe rash involving mucus membranes or skin necrosis: No Has patient had a PCN reaction that required hospitalization No Has patient had a PCN reaction occurring within the last 10 years: No If all of the  above answers are "NO", then may proceed with Cephalosporin use.      Current Outpatient Prescriptions  Medication Sig Dispense Refill  . buPROPion (WELLBUTRIN XL) 150 MG 24 hr tablet Take 150 mg by mouth daily.    Marland Kitchen diltiazem (CARDIZEM) 60 MG tablet Take 60 mg by mouth every 8 (eight) hours.    Marland Kitchen epoetin alfa (EPOGEN,PROCRIT) 16109 UNIT/ML injection Inject 8,000 Units into the skin 3 (three) times a week. To be given at dialysis, Monday Wednesday and Friday    . fentaNYL (DURAGESIC - DOSED MCG/HR) 50 MCG/HR Apply one patch every 72 hours for pain. Remove old patch. External use only. Rotate sites. 10 patch 0  . hydroxypropyl methylcellulose (ISOPTO TEARS) 2.5 % ophthalmic solution Place 1 drop into both eyes 2 (two) times daily.      Marland Kitchen lamiVUDine (EPIVIR) 10 MG/ML solution Take 2.5 mLs (25 mg total) by mouth daily. 240 mL 0  . lidocaine-prilocaine (EMLA) cream Apply 1 application topically 3 (three) times a week. Apply 1 hour prior to dialysis access site on Monday Wednesday and Friday    . LORazepam (ATIVAN) 0.5 MG tablet Take 0.5 tablets (0.25 mg total) by mouth every 6 (six) hours as needed for anxiety. 60 tablet 5  . metoprolol (LOPRESSOR) 50 MG tablet Take 50 mg by mouth 2 (two) times daily.    . multivitamin (RENA-VIT) TABS tablet Take 1 tablet by mouth at  bedtime.    Marland Kitchen omeprazole (PRILOSEC) 40 MG capsule Take 40 mg by mouth daily.     . ondansetron (ZOFRAN) 4 MG tablet Take 2 mg by mouth 3 (three) times daily. nausea    . oxyCODONE (OXY IR/ROXICODONE) 5 MG immediate release tablet Take one tablet by mouth every 6 hours as needed for moderate pain 120 tablet 0  . Oxycodone HCl 10 MG TABS Take 10 mg by mouth at bedtime.    . raltegravir (ISENTRESS) 400 MG tablet Take 400 mg by mouth 2 (two) times daily.     . sertraline (ZOLOFT) 100 MG tablet Take 100 mg by mouth daily.    . sildenafil (REVATIO) 20 MG tablet Take 20 mg by mouth 3 (three) times daily.     Marland Kitchen sulfamethoxazole-trimethoprim  (BACTRIM,SEPTRA) 400-80 MG per tablet Take 1 tablet by mouth 3 (three) times a week. 30 tablet 0  . vitamin C (ASCORBIC ACID) 500 MG tablet Take 500 mg by mouth 2 (two) times daily.    . zaleplon (SONATA) 5 MG capsule Take one capsule by mouth every night at bedtime for rest 30 capsule 0  . zidovudine (RETROVIR) 100 MG capsule Take 100 mg by mouth 3 (three) times daily.      . sevelamer carbonate (RENVELA) 800 MG tablet Take 1,600 mg by mouth 3 (three) times daily with meals.      No current facility-administered medications for this visit.    REVIEW OF SYSTEMS:  [X]  denotes positive finding, [ ]  denotes negative finding Cardiac  Comments:  Chest pain or chest pressure: x   Shortness of breath upon exertion: x   Short of breath when lying flat: x   Irregular heart rhythm:        Vascular    Pain in calf, thigh, or hip brought on by ambulation: x   Pain in feet at night that wakes you up from your sleep:  x   Blood clot in your veins:    Leg swelling:         Pulmonary    Oxygen at home:    Productive cough:     Wheezing:         Neurologic    Sudden weakness in arms or legs:  x   Sudden numbness in arms or legs:  x   Sudden onset of difficulty speaking or slurred speech:    Temporary loss of vision in one eye:     Problems with dizziness:         Gastrointestinal    Blood in stool:     Vomited blood:         Genitourinary    Burning when urinating:     Blood in urine:        Psychiatric    Major depression:         Hematologic    Bleeding problems:    Problems with blood clotting too easily:        Skin    Rashes or ulcers:        Constitutional    Fever or chills:      PHYSICAL EXAM: Filed Vitals:   03/05/15 1628  BP: 126/69  Pulse: 78  Temp: 99.8 F (37.7 C)  TempSrc: Oral  Resp: 20  Height: 5\' 2"  (1.575 m)  Weight: 106 lb 7.7 oz (48.3 kg)  SpO2: 100%    GENERAL: The patient is a well-nourished female, in no acute distress. The vital signs are  documented above. VASCULAR: Palpable thrill right arm fistula. Large hematoma right proximal upper arm. No erythema, no drainage. No ulceration. PULMONARY: Non-labored breathing MUSCULOSKELETAL: There are no major deformities or cyanosis. NEUROLOGIC: sensation intact left upper extremity SKIN: There are no ulcers or rashes noted. PSYCHIATRIC: The patient has a normal affect.  DATA:  Dialysis fistula duplex 02/28/2015 Patent right radiocephalic AV fistula. Competing branches in right mid forearm.  MEDICAL ISSUES: ESRD  The patient's right proximal upper arm has a large hematoma following infiltration of the fistula 2 weeks ago. This was the first time that the fistula was cannulated there. The patient reports that the swelling has improved. Fortunately, the patient's distal fistula is still functioning well. There is no evidence of infection. Her hematoma will resolve with time. Advised the patient to continue having her fistula cannulated below the antecubital space. No further intervention indicated at this time. She will follow up on an as-needed basis.  Virgina Jock, PA-C [facility] [place]   I have examined the patient, reviewed and agree with above.  Curt Jews, MD 03/05/2015 5:02 PM

## 2015-03-08 DIAGNOSIS — N186 End stage renal disease: Secondary | ICD-10-CM | POA: Diagnosis not present

## 2015-03-08 DIAGNOSIS — D509 Iron deficiency anemia, unspecified: Secondary | ICD-10-CM | POA: Diagnosis not present

## 2015-03-08 DIAGNOSIS — N2581 Secondary hyperparathyroidism of renal origin: Secondary | ICD-10-CM | POA: Diagnosis not present

## 2015-03-08 DIAGNOSIS — Z992 Dependence on renal dialysis: Secondary | ICD-10-CM | POA: Diagnosis not present

## 2015-03-08 DIAGNOSIS — D631 Anemia in chronic kidney disease: Secondary | ICD-10-CM | POA: Diagnosis not present

## 2015-03-11 ENCOUNTER — Ambulatory Visit: Payer: Medicare Other | Admitting: Adult Health

## 2015-03-11 NOTE — Progress Notes (Signed)
Cardiology Office Note   Date:  03/11/2015   ID:  [name], DOB 1950-08-05, MRN [id number]  PCP:  Cleda Mccreedy, MD  Cardiologist: Ross/  Jory Sims, NP   No chief complaint on file.     History of Present Illness: [name] is a 65 y.o. female who presents for ongoing assessment and management of hypertension, Diastolic CHF, atrial fib,  with admission demonstrating bradycardia. She had significant hyperkalemia, contributing to heart rate. This was related to several missed HD appt. She was also found to have a hematoma of the RUE, and therefore anticoagulation was not started, despite CHADS VASC score of 2.  She is without complaints today with the exception of some anxiety and depression concerning the impending death of a close friend. She is otherwise continuing her dialysis. Hematoma over the right upper arm, proximal to the dialysis fistula is improving but still prominent. She has no complaints of bleeding.   Past Medical History  Diagnosis Date  . Pulmonary HTN ([place])   . Hypertension   . Depression   . Insomnia   . Chronic diarrhea   . HIV (human immunodeficiency virus infection) ([place])   . C. difficile colitis 10/10/11  . Muscle weakness (generalized)   . Intracranial hemorrhage ([place])   . Anxiety   . Anemia   . ESRD on hemodialysis ([place])   . Atrial flutter ([place]) 02/22/2015  . Traumatic hematoma of right upper arm 02/22/2015    Past Surgical History  Procedure Laterality Date  . Dialysis shunts      previous one removed from left arm and now present one in right arm  . Esophagogastroduodenoscopy  12/15/2010    Procedure: ESOPHAGOGASTRODUODENOSCOPY (EGD);  Surgeon: Daneil Dolin, MD;  Location: [facility] ENDO SUITE;  Service: Endoscopy;  Laterality: N/A;  . Tonsillectomy    . Biopsy thyroid    . Excision of breast biopsy Right 05/04/2012    Procedure: EXCISION OF BREAST BIOPSY;  Surgeon: Donato Heinz, MD;  Location: [facility] ORS;  Service: General;   Laterality: Right;  Right Excisional Breast Biopsy  . Shuntogram Right 10/19/2013    Procedure: FISTULOGRAM;  Surgeon: Conrad Evans, MD;  Location: [facility] C[facility] LAB;  Service: Cardiovascular;  Laterality: Right;  . Fistulogram Right 03/26/2014    Procedure: Right Arm Fistulogram with Venoplasty Right Subclavian Vein and Inominate Vein. Debridement Fistula Ulcer;  Surgeon: Conrad Centre, MD;  Location: [facility];  Service: Vascular;  Laterality: Right;     Current Outpatient Prescriptions  Medication Sig Dispense Refill  . buPROPion (WELLBUTRIN XL) 150 MG 24 hr tablet Take 150 mg by mouth daily.    Marland Kitchen diltiazem (CARDIZEM) 60 MG tablet Take 60 mg by mouth every 8 (eight) hours.    Marland Kitchen epoetin alfa (EPOGEN,PROCRIT) 16109 UNIT/ML injection Inject 8,000 Units into the skin 3 (three) times a week. To be given at dialysis, Monday Wednesday and Friday    . fentaNYL (DURAGESIC - DOSED MCG/HR) 50 MCG/HR Apply one patch every 72 hours for pain. Remove old patch. External use only. Rotate sites. 10 patch 0  . hydroxypropyl methylcellulose (ISOPTO TEARS) 2.5 % ophthalmic solution Place 1 drop into both eyes 2 (two) times daily.      Marland Kitchen lamiVUDine (EPIVIR) 10 MG/ML solution Take 2.5 mLs (25 mg total) by mouth daily. 240 mL 0  . lidocaine-prilocaine (EMLA) cream Apply 1 application topically 3 (three) times a week. Apply 1 hour prior to dialysis access site on Monday Wednesday and  Friday    . LORazepam (ATIVAN) 0.5 MG tablet Take 0.5 tablets (0.25 mg total) by mouth every 6 (six) hours as needed for anxiety. 60 tablet 5  . metoprolol (LOPRESSOR) 50 MG tablet Take 50 mg by mouth 2 (two) times daily.    . multivitamin (RENA-VIT) TABS tablet Take 1 tablet by mouth at bedtime.    Marland Kitchen omeprazole (PRILOSEC) 40 MG capsule Take 40 mg by mouth daily.     . ondansetron (ZOFRAN) 4 MG tablet Take 2 mg by mouth 3 (three) times daily. nausea    . oxyCODONE (OXY IR/ROXICODONE) 5 MG immediate release tablet Take one tablet by mouth every 6  hours as needed for moderate pain 120 tablet 0  . Oxycodone HCl 10 MG TABS Take 10 mg by mouth at bedtime.    . raltegravir (ISENTRESS) 400 MG tablet Take 400 mg by mouth 2 (two) times daily.     . sertraline (ZOLOFT) 100 MG tablet Take 100 mg by mouth daily.    . sevelamer carbonate (RENVELA) 800 MG tablet Take 1,600 mg by mouth 3 (three) times daily with meals.     . sildenafil (REVATIO) 20 MG tablet Take 20 mg by mouth 3 (three) times daily.     Marland Kitchen sulfamethoxazole-trimethoprim (BACTRIM,SEPTRA) 400-80 MG per tablet Take 1 tablet by mouth 3 (three) times a week. 30 tablet 0  . vitamin C (ASCORBIC ACID) 500 MG tablet Take 500 mg by mouth 2 (two) times daily.    . zaleplon (SONATA) 5 MG capsule Take one capsule by mouth every night at bedtime for rest 30 capsule 0  . zidovudine (RETROVIR) 100 MG capsule Take 100 mg by mouth 3 (three) times daily.       No current facility-administered medications for this visit.    Allergies:   Latex and Penicillins    Social History:  The patient  reports that she has quit smoking. She has never used smokeless tobacco. She reports that she does not drink alcohol or use illicit drugs.   Family History:  The patient's family history is negative for Anesthesia problems, Hypotension, Malignant hyperthermia, and Pseudochol deficiency.    ROS: All other systems are reviewed and negative. Unless otherwise mentioned in H&P    PHYSICAL EXAM: VS:  There were no vitals taken for this visit. , BMI There is no weight on file to calculate BMI. GEN: Well nourished, well developed, in no acute distress HEENT: normal Neck: no JVD, carotid bruits, or masses Cardiac: RRR; no murmurs, rubs, or gallops,no edema  Respiratory:  clear to auscultation bilaterally, normal work of breathing GI: soft, nontender, nondistended, + BS MS: no deformity or atrophyHematoma prominent proximal to the dialysis fistula in the upper right arm.  Skin: warm and dry, no rash Neuro:   Strength and sensation are intact Psych: Tearful and anxious.   Recent Labs: 03/26/2014: Magnesium 2.1 02/20/2015: ALT 24; TSH 3.701 02/22/2015: BUN 60*; Creatinine, Ser 11.21*; Hemoglobin 9.2*; Platelets 119*; Potassium 4.6; Sodium 136    Lipid Panel    Component Value Date/Time   CHOL 119* 02/12/2015 1000   TRIG 79 02/12/2015 1000   HDL 51 02/12/2015 1000   CHOLHDL 2.3 02/12/2015 1000   VLDL 16 02/12/2015 1000   LDLCALC 52 02/12/2015 1000      Wt Readings from Last 3 Encounters:  03/05/15 106 lb 7.7 oz (48.3 kg)  02/22/15 106 lb 4.2 oz (48.2 kg)  08/30/14 111 lb (50.349 kg)     ASSESSMENT AND  PLAN:  1.  Hypertension:  Well controlled on diltiazem and metoprolol. No changes in medical regimen.   2. Hx of Bradycardia: Due to electrolyte abnormalities related to non-compliance with HD. She is now going more regularly with labs followed by nephrology.   3.Chronic Depression and Anxiety-Worsened today due to impending death of good friend.    Current medicines are reviewed at length with the patient today.    Labs/ tests ordered today include:  No orders of the defined types were placed in this encounter.     Disposition:   FU with one year.   Signed, Jory Sims, NP  03/11/2015 3:25 PM    [facility] [address] [place], [place] [address] Phone: [phone]; Fax: [phone]

## 2015-03-12 ENCOUNTER — Encounter: Payer: Self-pay | Admitting: Adult Health

## 2015-03-12 ENCOUNTER — Ambulatory Visit (INDEPENDENT_AMBULATORY_CARE_PROVIDER_SITE_OTHER): Payer: Medicare Other | Admitting: Adult Health

## 2015-03-12 VITALS — BP 116/62 | HR 69 | Ht 62.0 in | Wt 112.0 lb

## 2015-03-12 DIAGNOSIS — R001 Bradycardia, unspecified: Secondary | ICD-10-CM

## 2015-03-12 DIAGNOSIS — I150 Renovascular hypertension: Secondary | ICD-10-CM | POA: Diagnosis not present

## 2015-03-12 NOTE — Progress Notes (Signed)
Name: [name]    DOB: 24-Nov-1950  Age: 65 y.o.  MR#: [id number]       PCP:  Cleda Mccreedy, MD      Insurance: Payor: MEDICARE / Plan: MEDICARE PART A AND B / Product Type: *No Product type* /   CC:   No chief complaint on file.   VS Filed Vitals:   03/12/15 1504  BP: 116/62  Pulse: 69  Height: 5\' 2"  (1.575 m)  Weight: 112 lb (50.803 kg)  SpO2: 94%    Weights Current Weight  03/12/15 112 lb (50.803 kg)  03/05/15 106 lb 7.7 oz (48.3 kg)  02/22/15 106 lb 4.2 oz (48.2 kg)    Blood Pressure  BP Readings from Last 3 Encounters:  03/12/15 116/62  03/05/15 126/69  02/22/15 134/65     Admit date:  (Not on file) Last encounter with RMR:  Visit date not found   Allergy Latex and Penicillins  Current Outpatient Prescriptions  Medication Sig Dispense Refill  . buPROPion (WELLBUTRIN XL) 150 MG 24 hr tablet Take 150 mg by mouth daily.    Marland Kitchen diltiazem (CARDIZEM) 60 MG tablet Take 60 mg by mouth every 8 (eight) hours.    . fentaNYL (DURAGESIC - DOSED MCG/HR) 50 MCG/HR Apply one patch every 72 hours for pain. Remove old patch. External use only. Rotate sites. 10 patch 0  . lamiVUDine (EPIVIR) 10 MG/ML solution Take 2.5 mLs (25 mg total) by mouth daily. 240 mL 0  . lidocaine-prilocaine (EMLA) cream Apply 1 application topically 3 (three) times a week. Apply 1 hour prior to dialysis access site on Monday Wednesday and Friday    . LORazepam (ATIVAN) 0.5 MG tablet Take 0.5 tablets (0.25 mg total) by mouth every 6 (six) hours as needed for anxiety. 60 tablet 5  . metoprolol (LOPRESSOR) 50 MG tablet Take 50 mg by mouth 2 (two) times daily.    . multivitamin (RENA-VIT) TABS tablet Take 1 tablet by mouth at bedtime.    Marland Kitchen omeprazole (PRILOSEC) 40 MG capsule Take 40 mg by mouth daily.     . ondansetron (ZOFRAN) 4 MG tablet Take 2 mg by mouth 3 (three) times daily. nausea    . oxyCODONE (OXY IR/ROXICODONE) 5 MG immediate release tablet Take one tablet by mouth every 6 hours as needed for  moderate pain 120 tablet 0  . Oxycodone HCl 10 MG TABS Take 10 mg by mouth at bedtime.    . raltegravir (ISENTRESS) 400 MG tablet Take 400 mg by mouth 2 (two) times daily.     . sertraline (ZOLOFT) 100 MG tablet Take 100 mg by mouth daily.    . sevelamer carbonate (RENVELA) 800 MG tablet Take 1,600 mg by mouth 3 (three) times daily with meals.     . sildenafil (REVATIO) 20 MG tablet Take 20 mg by mouth 3 (three) times daily.     Marland Kitchen sulfamethoxazole-trimethoprim (BACTRIM,SEPTRA) 400-80 MG per tablet Take 1 tablet by mouth 3 (three) times a week. 30 tablet 0  . vitamin C (ASCORBIC ACID) 500 MG tablet Take 500 mg by mouth 2 (two) times daily.    . zaleplon (SONATA) 5 MG capsule Take one capsule by mouth every night at bedtime for rest 30 capsule 0  . zidovudine (RETROVIR) 100 MG capsule Take 100 mg by mouth 3 (three) times daily.      Marland Kitchen epoetin alfa (EPOGEN,PROCRIT) 16109 UNIT/ML injection Inject 8,000 Units into the skin 3 (three) times a week. To be given  at dialysis, Monday Wednesday and Friday    . hydroxypropyl methylcellulose (ISOPTO TEARS) 2.5 % ophthalmic solution Place 1 drop into both eyes 2 (two) times daily.       No current facility-administered medications for this visit.    Discontinued Meds:   There are no discontinued medications.  Patient Active Problem List   Diagnosis Date Noted  . Atrial flutter ([place]) 02/22/2015  . Traumatic hematoma of right upper arm 02/22/2015  . Hyperkalemia 02/20/2015  . Venous stenosis of right upper extremity 04/06/2014  . ESRD on dialysis ([place]) 04/06/2014  . HIV (human immunodeficiency virus infection) ([place])   . Essential hypertension   . 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 Arm swelling 10/10/2013  . Nausea with vomiting 12/15/2012  . CHF, chronic ([place]) 11/25/2012  . Atrial fibrillation ([place]) 05/02/2012  . Intracranial bleed ([place]) 12/28/2011  . Angioedema of lips 09/21/2011  . Cellulitis of breast 03/09/2011  . Erosive esophagitis  12/15/2010  . Mallory - Weiss tear 12/15/2010  . UGI bleed 12/13/2010  . DIARRHEA 04/12/2009  . Human immunodeficiency virus (HIV) disease ([place]) 11/15/2008  . ALCOHOL ABUSE 11/15/2008  . CHF 11/15/2008  . End stage renal disease ([place]) 11/15/2008  . PANCREATITIS, HX OF 11/15/2008  . TOBACCO USE, QUIT 11/15/2008  . PAIN IN JOINT, UPPER ARM 08/07/2008  . ABDOMINAL WALL HERNIA 04/26/2007  . BRONCHITIS, ACUTE 11/16/2006  . HYPOKALEMIA, HX OF 07/13/2006  . ANEMIA, NORMOCYTIC, CHRONIC 03/23/2006  . ANXIETY 03/23/2006  . DEPRESSION 03/23/2006  . Essential hypertension, malignant 03/23/2006  . GERD 03/23/2006  . PANCREATITIS 03/23/2006  . MASS, RIGHT AXILLA 03/23/2006  . HEADACHE 03/23/2006  . SYMPTOM, ENLARGEMENT, LYMPH NODES 03/23/2006  . SHINGLES, HX OF 03/23/2006  . HYSTERECTOMY, TOTAL, HX OF 03/23/2006    LABS    Component Value Date/Time   NA 136 02/22/2015 0513   NA 136 02/21/2015 0547   NA 136 02/20/2015 0848   K 4.6 02/22/2015 0513   K 4.8 02/21/2015 0547   K >7.5* 02/20/2015 0848   CL 94* 02/22/2015 0513   CL 93* 02/21/2015 0547   CL 100* 02/20/2015 0848   CO2 28 02/22/2015 0513   CO2 26 02/21/2015 0547   CO2 18* 02/20/2015 0848   GLUCOSE 77 02/22/2015 0513   GLUCOSE 61* 02/21/2015 0547   GLUCOSE 121* 02/20/2015 0848   BUN 60* 02/22/2015 0513   BUN 44* 02/21/2015 0547   BUN 128* 02/20/2015 0848   CREATININE 11.21* 02/22/2015 0513   CREATININE 9.15* 02/21/2015 0547   CREATININE 18.34* 02/20/2015 0848   CREATININE 8.54* 02/12/2015 1000   CALCIUM 8.8* 02/22/2015 0513   CALCIUM 9.1 02/21/2015 0547   CALCIUM 8.6* 02/20/2015 0848   CALCIUM 9.0 03/12/2011 0527   CALCIUM 7.9* 06/13/2008 0420   GFRNONAA 3* 02/22/2015 0513   GFRNONAA 4* 02/21/2015 0547   GFRNONAA 2* 02/20/2015 0848   GFRNONAA 5* 02/12/2015 1000   GFRAA 4* 02/22/2015 0513   GFRAA 5* 02/21/2015 0547   GFRAA 2* 02/20/2015 0848   GFRAA 5* 02/12/2015 1000   CMP     Component Value Date/Time   NA  136 02/22/2015 0513   K 4.6 02/22/2015 0513   CL 94* 02/22/2015 0513   CO2 28 02/22/2015 0513   GLUCOSE 77 02/22/2015 0513   BUN 60* 02/22/2015 0513   CREATININE 11.21* 02/22/2015 0513   CREATININE 8.54* 02/12/2015 1000   CALCIUM 8.8* 02/22/2015 0513   CALCIUM 9.0 03/12/2011 0527   PROT 8.5* 02/20/2015 0848  ALBUMIN 3.5 02/20/2015 0848   AST 27 02/20/2015 0848   ALT 24 02/20/2015 0848   ALKPHOS 77 02/20/2015 0848   BILITOT 0.7 02/20/2015 0848   GFRNONAA 3* 02/22/2015 0513   GFRNONAA 5* 02/12/2015 1000   GFRAA 4* 02/22/2015 0513   GFRAA 5* 02/12/2015 1000       Component Value Date/Time   WBC 3.2* 02/22/2015 0513   WBC 4.3 02/21/2015 0547   WBC 7.4 02/20/2015 0848   HGB 9.2* 02/22/2015 0513   HGB 10.1* 02/21/2015 0547   HGB 10.8* 02/20/2015 0848   HCT 28.8* 02/22/2015 0513   HCT 32.0* 02/21/2015 0547   HCT 34.4* 02/20/2015 0848   MCV 104.3* 02/22/2015 0513   MCV 104.6* 02/21/2015 0547   MCV 106.2* 02/20/2015 0848    Lipid Panel     Component Value Date/Time   CHOL 119* 02/12/2015 1000   TRIG 79 02/12/2015 1000   HDL 51 02/12/2015 1000   CHOLHDL 2.3 02/12/2015 1000   VLDL 16 02/12/2015 1000   LDLCALC 52 02/12/2015 1000    ABG    Component Value Date/Time   PHART 7.426* 01/26/2009 1040   PCO2ART 36.9 01/26/2009 1040   PO2ART 112.0* 01/26/2009 1040   HCO3 23.8 01/26/2009 1040   TCO2 22 03/25/2014 1555   ACIDBASEDEF 0.0 01/26/2009 1040   O2SAT 98.9 01/26/2009 1040     Lab Results  Component Value Date   TSH 3.701 02/20/2015   BNP (last 3 results) No results for input(s): BNP in the last 8760 hours.  ProBNP (last 3 results) No results for input(s): PROBNP in the last 8760 hours.  Cardiac Panel (last 3 results) No results for input(s): CKTOTAL, CKMB, TROPONINI, RELINDX in the last 72 hours.  Iron/TIBC/Ferritin/ %Sat    Component Value Date/Time   IRON 58 12/14/2007 0520   TIBC 137* 12/14/2007 0520   FERRITIN 933* 12/14/2007 0520   IRONPCTSAT 42  12/14/2007 0520     EKG Orders placed or performed during the hospital encounter of 02/20/15  . EKG 12-Lead  . EKG 12-Lead  . EKG 12-Lead  . EKG 12-Lead  . EKG 12-Lead  . EKG 12-Lead  . EKG 12-Lead  . EKG 12-Lead  . EKG 12-Lead  . EKG 12-Lead  . EKG  . EKG 12-Lead  . EKG 12-Lead     Prior Assessment and Plan Problem List as of 03/12/2015      Cardiovascular and Mediastinum   Atrial fibrillation [facility])   Last Assessment & Plan 05/02/2012 Office Visit Written 05/02/2012 11:11 AM by Campbell Riches, MD    Suspect she has afib. Will defer this w/u to her PCP.       Essential hypertension, malignant   Last Assessment & Plan 08/30/2014 Office Visit Written 08/30/2014 12:06 PM by Campbell Riches, MD    She is being follwed by nephrology. Will defer med change to them.       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  Immune and Lymphatic   SYMPTOM, ENLARGEMENT, LYMPH NODES   Last Assessment & Plan 05/02/2012 Office Visit Written 05/02/2012 11:07 AM by Campbell Riches, MD    Await path from her Bx.         Genitourinary   End stage renal disease [facility])   Last Assessment & Plan 02/20/2014 Office Visit Written 02/20/2014  4:12 PM by Campbell Riches, MD    Will f/u with renal, greatly appreciate partnering with them.        HYSTERECTOMY, TOTAL, HX OF   ESRD on dialysis [facility])   Last Assessment & Plan 08/30/2014 Office Visit Written 08/30/2014 12:07 PM by Campbell Riches, MD    Greatly appreciate renal f/u.  Will defer to them as far as what is an appropriate nutritional  supplement (nepro?).         Other   Human immunodeficiency virus (HIV) disease [facility])   Last Assessment & Plan 08/30/2014 Office Visit Written 08/30/2014 12:08 PM by Campbell Riches, MD    She is doing very well.  Will continue her current art.  Offered/refused condoms.  vax are up to date.  Labs at SNF See her back in 6 months.       ANEMIA, NORMOCYTIC, CHRONIC   ANXIETY   ALCOHOL ABUSE   DEPRESSION   Last Assessment & Plan 12/15/2012 Office Visit Written 12/15/2012  2:30 PM by Campbell Riches, MD    Pt becomes tearful, talks about people who she knows (who were her friends) who won't have anything to do with her because she has HIV.       ABDOMINAL WALL HERNIA   PANCREATITIS   PAIN IN JOINT, UPPER ARM   MASS, RIGHT AXILLA   HEADACHE   DIARRHEA   HYPOKALEMIA, HX OF   PANCREATITIS, HX OF   SHINGLES, HX OF   TOBACCO USE, QUIT   Cellulitis of breast   Last Assessment & Plan 05/21/2011 Office Visit Written 05/21/2011  3:02 PM by Campbell Riches, MD    This is very concerning. Will get records of her eval, have her seen by surgery if not yet done.       Angioedema of lips   Intracranial bleed (HCC)   Arm swelling   HIV (human immunodeficiency virus infection) (HCC)   Hyperkalemia   Traumatic hematoma of right upper arm       Imaging: Dg Chest Portable 1 View  02/20/2015  CLINICAL DATA:  Dialysis patient with shortness of breath, nausea and vomiting. EXAM: PORTABLE CHEST 1 VIEW COMPARISON:  03/25/2014 FINDINGS: Heart size is at the upper limits of normal. There is atherosclerosis of the aorta. There is chronic central vascular prominence and likely pulmonary venous hypertension. No frank edema. No effusions. No acute bone finding. IMPRESSION: Borderline cardiomegaly. Atherosclerosis of the aorta. Chronic central vascular prominence and pulmonary venous hypertension without frank edema. Electronically Signed   By: Nelson Chimes M.D.   On: 02/20/2015 08:49

## 2015-03-12 NOTE — Patient Instructions (Signed)
Your physician wants you to follow-up in: 6 months with K lawrence NP You will receive a reminder letter in the mail two months in advance. If you don't receive a letter, please call our office to schedule the follow-up appointment.    Your physician recommends that you continue on your current medications as directed. Please refer to the Current Medication list given to you today.     If you need a refill on your cardiac medications before your next appointment, please call your pharmacy.     Thank you for choosing [facility] !

## 2015-03-13 DIAGNOSIS — N186 End stage renal disease: Secondary | ICD-10-CM | POA: Diagnosis not present

## 2015-03-13 DIAGNOSIS — Z992 Dependence on renal dialysis: Secondary | ICD-10-CM | POA: Diagnosis not present

## 2015-03-13 DIAGNOSIS — N2581 Secondary hyperparathyroidism of renal origin: Secondary | ICD-10-CM | POA: Diagnosis not present

## 2015-03-13 DIAGNOSIS — D509 Iron deficiency anemia, unspecified: Secondary | ICD-10-CM | POA: Diagnosis not present

## 2015-03-13 DIAGNOSIS — D631 Anemia in chronic kidney disease: Secondary | ICD-10-CM | POA: Diagnosis not present

## 2015-03-16 DIAGNOSIS — I10 Essential (primary) hypertension: Secondary | ICD-10-CM | POA: Diagnosis not present

## 2015-03-16 DIAGNOSIS — E875 Hyperkalemia: Secondary | ICD-10-CM | POA: Diagnosis not present

## 2015-03-16 DIAGNOSIS — R531 Weakness: Secondary | ICD-10-CM | POA: Diagnosis not present

## 2015-03-16 DIAGNOSIS — K219 Gastro-esophageal reflux disease without esophagitis: Secondary | ICD-10-CM | POA: Diagnosis not present

## 2015-03-18 DIAGNOSIS — N186 End stage renal disease: Secondary | ICD-10-CM | POA: Diagnosis not present

## 2015-03-18 DIAGNOSIS — D509 Iron deficiency anemia, unspecified: Secondary | ICD-10-CM | POA: Diagnosis not present

## 2015-03-18 DIAGNOSIS — D631 Anemia in chronic kidney disease: Secondary | ICD-10-CM | POA: Diagnosis not present

## 2015-03-18 DIAGNOSIS — N2581 Secondary hyperparathyroidism of renal origin: Secondary | ICD-10-CM | POA: Diagnosis not present

## 2015-03-18 DIAGNOSIS — Z992 Dependence on renal dialysis: Secondary | ICD-10-CM | POA: Diagnosis not present

## 2015-03-22 DIAGNOSIS — D631 Anemia in chronic kidney disease: Secondary | ICD-10-CM | POA: Diagnosis not present

## 2015-03-22 DIAGNOSIS — N186 End stage renal disease: Secondary | ICD-10-CM | POA: Diagnosis not present

## 2015-03-22 DIAGNOSIS — N2581 Secondary hyperparathyroidism of renal origin: Secondary | ICD-10-CM | POA: Diagnosis not present

## 2015-03-22 DIAGNOSIS — D509 Iron deficiency anemia, unspecified: Secondary | ICD-10-CM | POA: Diagnosis not present

## 2015-03-22 DIAGNOSIS — Z992 Dependence on renal dialysis: Secondary | ICD-10-CM | POA: Diagnosis not present

## 2015-03-23 DIAGNOSIS — F411 Generalized anxiety disorder: Secondary | ICD-10-CM | POA: Diagnosis not present

## 2015-03-23 DIAGNOSIS — F331 Major depressive disorder, recurrent, moderate: Secondary | ICD-10-CM | POA: Diagnosis not present

## 2015-03-24 DIAGNOSIS — Z743 Need for continuous supervision: Secondary | ICD-10-CM | POA: Diagnosis not present

## 2015-03-24 DIAGNOSIS — R279 Unspecified lack of coordination: Secondary | ICD-10-CM | POA: Diagnosis not present

## 2015-03-24 DIAGNOSIS — R1110 Vomiting, unspecified: Secondary | ICD-10-CM | POA: Diagnosis not present

## 2015-03-24 DIAGNOSIS — K2970 Gastritis, unspecified, without bleeding: Secondary | ICD-10-CM | POA: Diagnosis not present

## 2015-03-24 DIAGNOSIS — Z87891 Personal history of nicotine dependence: Secondary | ICD-10-CM | POA: Diagnosis not present

## 2015-03-24 DIAGNOSIS — R1084 Generalized abdominal pain: Secondary | ICD-10-CM | POA: Diagnosis not present

## 2015-03-24 DIAGNOSIS — R1111 Vomiting without nausea: Secondary | ICD-10-CM | POA: Diagnosis not present

## 2015-03-24 DIAGNOSIS — N186 End stage renal disease: Secondary | ICD-10-CM | POA: Diagnosis not present

## 2015-03-24 DIAGNOSIS — K219 Gastro-esophageal reflux disease without esophagitis: Secondary | ICD-10-CM | POA: Diagnosis not present

## 2015-03-24 DIAGNOSIS — Z992 Dependence on renal dialysis: Secondary | ICD-10-CM | POA: Diagnosis not present

## 2015-03-24 DIAGNOSIS — R05 Cough: Secondary | ICD-10-CM | POA: Diagnosis not present

## 2015-03-25 DIAGNOSIS — Z992 Dependence on renal dialysis: Secondary | ICD-10-CM | POA: Diagnosis not present

## 2015-03-25 DIAGNOSIS — N186 End stage renal disease: Secondary | ICD-10-CM | POA: Diagnosis not present

## 2015-03-25 DIAGNOSIS — D509 Iron deficiency anemia, unspecified: Secondary | ICD-10-CM | POA: Diagnosis not present

## 2015-03-25 DIAGNOSIS — N2581 Secondary hyperparathyroidism of renal origin: Secondary | ICD-10-CM | POA: Diagnosis not present

## 2015-03-25 DIAGNOSIS — D631 Anemia in chronic kidney disease: Secondary | ICD-10-CM | POA: Diagnosis not present

## 2015-03-26 DIAGNOSIS — Z992 Dependence on renal dialysis: Secondary | ICD-10-CM | POA: Diagnosis not present

## 2015-03-26 DIAGNOSIS — I871 Compression of vein: Secondary | ICD-10-CM | POA: Diagnosis not present

## 2015-03-26 DIAGNOSIS — T82858D Stenosis of vascular prosthetic devices, implants and grafts, subsequent encounter: Secondary | ICD-10-CM | POA: Diagnosis not present

## 2015-03-26 DIAGNOSIS — N186 End stage renal disease: Secondary | ICD-10-CM | POA: Diagnosis not present

## 2015-03-27 DIAGNOSIS — D509 Iron deficiency anemia, unspecified: Secondary | ICD-10-CM | POA: Diagnosis not present

## 2015-03-27 DIAGNOSIS — N186 End stage renal disease: Secondary | ICD-10-CM | POA: Diagnosis not present

## 2015-03-27 DIAGNOSIS — D631 Anemia in chronic kidney disease: Secondary | ICD-10-CM | POA: Diagnosis not present

## 2015-03-27 DIAGNOSIS — Z992 Dependence on renal dialysis: Secondary | ICD-10-CM | POA: Diagnosis not present

## 2015-03-27 DIAGNOSIS — N2581 Secondary hyperparathyroidism of renal origin: Secondary | ICD-10-CM | POA: Diagnosis not present

## 2015-03-28 DIAGNOSIS — G894 Chronic pain syndrome: Secondary | ICD-10-CM | POA: Diagnosis not present

## 2015-03-29 DIAGNOSIS — N2581 Secondary hyperparathyroidism of renal origin: Secondary | ICD-10-CM | POA: Diagnosis not present

## 2015-03-29 DIAGNOSIS — D509 Iron deficiency anemia, unspecified: Secondary | ICD-10-CM | POA: Diagnosis not present

## 2015-03-29 DIAGNOSIS — N186 End stage renal disease: Secondary | ICD-10-CM | POA: Diagnosis not present

## 2015-03-29 DIAGNOSIS — D631 Anemia in chronic kidney disease: Secondary | ICD-10-CM | POA: Diagnosis not present

## 2015-03-29 DIAGNOSIS — Z992 Dependence on renal dialysis: Secondary | ICD-10-CM | POA: Diagnosis not present

## 2015-04-01 DIAGNOSIS — N2581 Secondary hyperparathyroidism of renal origin: Secondary | ICD-10-CM | POA: Diagnosis not present

## 2015-04-01 DIAGNOSIS — D509 Iron deficiency anemia, unspecified: Secondary | ICD-10-CM | POA: Diagnosis not present

## 2015-04-01 DIAGNOSIS — Z992 Dependence on renal dialysis: Secondary | ICD-10-CM | POA: Diagnosis not present

## 2015-04-01 DIAGNOSIS — D631 Anemia in chronic kidney disease: Secondary | ICD-10-CM | POA: Diagnosis not present

## 2015-04-01 DIAGNOSIS — N186 End stage renal disease: Secondary | ICD-10-CM | POA: Diagnosis not present

## 2015-04-02 DIAGNOSIS — N186 End stage renal disease: Secondary | ICD-10-CM | POA: Diagnosis not present

## 2015-04-02 DIAGNOSIS — Z992 Dependence on renal dialysis: Secondary | ICD-10-CM | POA: Diagnosis not present

## 2015-04-03 DIAGNOSIS — R112 Nausea with vomiting, unspecified: Secondary | ICD-10-CM | POA: Diagnosis not present

## 2015-04-03 DIAGNOSIS — N186 End stage renal disease: Secondary | ICD-10-CM | POA: Diagnosis not present

## 2015-04-03 DIAGNOSIS — E611 Iron deficiency: Secondary | ICD-10-CM | POA: Diagnosis not present

## 2015-04-03 DIAGNOSIS — Z992 Dependence on renal dialysis: Secondary | ICD-10-CM | POA: Diagnosis not present

## 2015-04-03 DIAGNOSIS — D472 Monoclonal gammopathy: Secondary | ICD-10-CM | POA: Diagnosis not present

## 2015-04-03 DIAGNOSIS — N2581 Secondary hyperparathyroidism of renal origin: Secondary | ICD-10-CM | POA: Diagnosis not present

## 2015-04-03 DIAGNOSIS — D631 Anemia in chronic kidney disease: Secondary | ICD-10-CM | POA: Diagnosis not present

## 2015-04-05 DIAGNOSIS — E611 Iron deficiency: Secondary | ICD-10-CM | POA: Diagnosis not present

## 2015-04-05 DIAGNOSIS — R112 Nausea with vomiting, unspecified: Secondary | ICD-10-CM | POA: Diagnosis not present

## 2015-04-05 DIAGNOSIS — N186 End stage renal disease: Secondary | ICD-10-CM | POA: Diagnosis not present

## 2015-04-05 DIAGNOSIS — N2581 Secondary hyperparathyroidism of renal origin: Secondary | ICD-10-CM | POA: Diagnosis not present

## 2015-04-05 DIAGNOSIS — D472 Monoclonal gammopathy: Secondary | ICD-10-CM | POA: Diagnosis not present

## 2015-04-05 DIAGNOSIS — D631 Anemia in chronic kidney disease: Secondary | ICD-10-CM | POA: Diagnosis not present

## 2015-04-08 DIAGNOSIS — N2581 Secondary hyperparathyroidism of renal origin: Secondary | ICD-10-CM | POA: Diagnosis not present

## 2015-04-08 DIAGNOSIS — D472 Monoclonal gammopathy: Secondary | ICD-10-CM | POA: Diagnosis not present

## 2015-04-08 DIAGNOSIS — R112 Nausea with vomiting, unspecified: Secondary | ICD-10-CM | POA: Diagnosis not present

## 2015-04-08 DIAGNOSIS — N186 End stage renal disease: Secondary | ICD-10-CM | POA: Diagnosis not present

## 2015-04-08 DIAGNOSIS — D631 Anemia in chronic kidney disease: Secondary | ICD-10-CM | POA: Diagnosis not present

## 2015-04-08 DIAGNOSIS — E611 Iron deficiency: Secondary | ICD-10-CM | POA: Diagnosis not present

## 2015-04-09 DIAGNOSIS — F411 Generalized anxiety disorder: Secondary | ICD-10-CM | POA: Diagnosis not present

## 2015-04-09 DIAGNOSIS — F331 Major depressive disorder, recurrent, moderate: Secondary | ICD-10-CM | POA: Diagnosis not present

## 2015-04-11 DIAGNOSIS — N186 End stage renal disease: Secondary | ICD-10-CM | POA: Diagnosis not present

## 2015-04-11 DIAGNOSIS — D631 Anemia in chronic kidney disease: Secondary | ICD-10-CM | POA: Diagnosis not present

## 2015-04-11 DIAGNOSIS — E611 Iron deficiency: Secondary | ICD-10-CM | POA: Diagnosis not present

## 2015-04-11 DIAGNOSIS — R112 Nausea with vomiting, unspecified: Secondary | ICD-10-CM | POA: Diagnosis not present

## 2015-04-11 DIAGNOSIS — D472 Monoclonal gammopathy: Secondary | ICD-10-CM | POA: Diagnosis not present

## 2015-04-11 DIAGNOSIS — N2581 Secondary hyperparathyroidism of renal origin: Secondary | ICD-10-CM | POA: Diagnosis not present

## 2015-04-15 DIAGNOSIS — E611 Iron deficiency: Secondary | ICD-10-CM | POA: Diagnosis not present

## 2015-04-15 DIAGNOSIS — N2581 Secondary hyperparathyroidism of renal origin: Secondary | ICD-10-CM | POA: Diagnosis not present

## 2015-04-15 DIAGNOSIS — N186 End stage renal disease: Secondary | ICD-10-CM | POA: Diagnosis not present

## 2015-04-15 DIAGNOSIS — D472 Monoclonal gammopathy: Secondary | ICD-10-CM | POA: Diagnosis not present

## 2015-04-15 DIAGNOSIS — D631 Anemia in chronic kidney disease: Secondary | ICD-10-CM | POA: Diagnosis not present

## 2015-04-15 DIAGNOSIS — R112 Nausea with vomiting, unspecified: Secondary | ICD-10-CM | POA: Diagnosis not present

## 2015-04-19 DIAGNOSIS — E611 Iron deficiency: Secondary | ICD-10-CM | POA: Diagnosis not present

## 2015-04-19 DIAGNOSIS — N2581 Secondary hyperparathyroidism of renal origin: Secondary | ICD-10-CM | POA: Diagnosis not present

## 2015-04-19 DIAGNOSIS — N186 End stage renal disease: Secondary | ICD-10-CM | POA: Diagnosis not present

## 2015-04-19 DIAGNOSIS — D472 Monoclonal gammopathy: Secondary | ICD-10-CM | POA: Diagnosis not present

## 2015-04-19 DIAGNOSIS — D631 Anemia in chronic kidney disease: Secondary | ICD-10-CM | POA: Diagnosis not present

## 2015-04-19 DIAGNOSIS — R112 Nausea with vomiting, unspecified: Secondary | ICD-10-CM | POA: Diagnosis not present

## 2015-04-22 DIAGNOSIS — N186 End stage renal disease: Secondary | ICD-10-CM | POA: Diagnosis not present

## 2015-04-22 DIAGNOSIS — D631 Anemia in chronic kidney disease: Secondary | ICD-10-CM | POA: Diagnosis not present

## 2015-04-22 DIAGNOSIS — E611 Iron deficiency: Secondary | ICD-10-CM | POA: Diagnosis not present

## 2015-04-22 DIAGNOSIS — R112 Nausea with vomiting, unspecified: Secondary | ICD-10-CM | POA: Diagnosis not present

## 2015-04-22 DIAGNOSIS — N2581 Secondary hyperparathyroidism of renal origin: Secondary | ICD-10-CM | POA: Diagnosis not present

## 2015-04-22 DIAGNOSIS — D472 Monoclonal gammopathy: Secondary | ICD-10-CM | POA: Diagnosis not present

## 2015-04-23 DIAGNOSIS — F411 Generalized anxiety disorder: Secondary | ICD-10-CM | POA: Diagnosis not present

## 2015-04-23 DIAGNOSIS — F331 Major depressive disorder, recurrent, moderate: Secondary | ICD-10-CM | POA: Diagnosis not present

## 2015-04-24 DIAGNOSIS — N2581 Secondary hyperparathyroidism of renal origin: Secondary | ICD-10-CM | POA: Diagnosis not present

## 2015-04-24 DIAGNOSIS — N186 End stage renal disease: Secondary | ICD-10-CM | POA: Diagnosis not present

## 2015-04-24 DIAGNOSIS — D472 Monoclonal gammopathy: Secondary | ICD-10-CM | POA: Diagnosis not present

## 2015-04-24 DIAGNOSIS — R112 Nausea with vomiting, unspecified: Secondary | ICD-10-CM | POA: Diagnosis not present

## 2015-04-24 DIAGNOSIS — D631 Anemia in chronic kidney disease: Secondary | ICD-10-CM | POA: Diagnosis not present

## 2015-04-24 DIAGNOSIS — E611 Iron deficiency: Secondary | ICD-10-CM | POA: Diagnosis not present

## 2015-04-25 DIAGNOSIS — G894 Chronic pain syndrome: Secondary | ICD-10-CM | POA: Diagnosis not present

## 2015-04-29 DIAGNOSIS — N2581 Secondary hyperparathyroidism of renal origin: Secondary | ICD-10-CM | POA: Diagnosis not present

## 2015-04-29 DIAGNOSIS — D631 Anemia in chronic kidney disease: Secondary | ICD-10-CM | POA: Diagnosis not present

## 2015-04-29 DIAGNOSIS — D472 Monoclonal gammopathy: Secondary | ICD-10-CM | POA: Diagnosis not present

## 2015-04-29 DIAGNOSIS — N186 End stage renal disease: Secondary | ICD-10-CM | POA: Diagnosis not present

## 2015-04-29 DIAGNOSIS — R112 Nausea with vomiting, unspecified: Secondary | ICD-10-CM | POA: Diagnosis not present

## 2015-04-29 DIAGNOSIS — E611 Iron deficiency: Secondary | ICD-10-CM | POA: Diagnosis not present

## 2015-04-30 DIAGNOSIS — N186 End stage renal disease: Secondary | ICD-10-CM | POA: Diagnosis not present

## 2015-04-30 DIAGNOSIS — Z992 Dependence on renal dialysis: Secondary | ICD-10-CM | POA: Diagnosis not present

## 2015-05-01 DIAGNOSIS — Z992 Dependence on renal dialysis: Secondary | ICD-10-CM | POA: Diagnosis not present

## 2015-05-01 DIAGNOSIS — D472 Monoclonal gammopathy: Secondary | ICD-10-CM | POA: Diagnosis not present

## 2015-05-01 DIAGNOSIS — D631 Anemia in chronic kidney disease: Secondary | ICD-10-CM | POA: Diagnosis not present

## 2015-05-01 DIAGNOSIS — N186 End stage renal disease: Secondary | ICD-10-CM | POA: Diagnosis not present

## 2015-05-01 DIAGNOSIS — E611 Iron deficiency: Secondary | ICD-10-CM | POA: Diagnosis not present

## 2015-05-01 DIAGNOSIS — N2581 Secondary hyperparathyroidism of renal origin: Secondary | ICD-10-CM | POA: Diagnosis not present

## 2015-05-03 DIAGNOSIS — D631 Anemia in chronic kidney disease: Secondary | ICD-10-CM | POA: Diagnosis not present

## 2015-05-03 DIAGNOSIS — Z992 Dependence on renal dialysis: Secondary | ICD-10-CM | POA: Diagnosis not present

## 2015-05-03 DIAGNOSIS — D472 Monoclonal gammopathy: Secondary | ICD-10-CM | POA: Diagnosis not present

## 2015-05-03 DIAGNOSIS — N186 End stage renal disease: Secondary | ICD-10-CM | POA: Diagnosis not present

## 2015-05-03 DIAGNOSIS — E611 Iron deficiency: Secondary | ICD-10-CM | POA: Diagnosis not present

## 2015-05-03 DIAGNOSIS — N2581 Secondary hyperparathyroidism of renal origin: Secondary | ICD-10-CM | POA: Diagnosis not present

## 2015-05-06 DIAGNOSIS — F331 Major depressive disorder, recurrent, moderate: Secondary | ICD-10-CM | POA: Diagnosis not present

## 2015-05-06 DIAGNOSIS — F411 Generalized anxiety disorder: Secondary | ICD-10-CM | POA: Diagnosis not present

## 2015-05-08 DIAGNOSIS — R110 Nausea: Secondary | ICD-10-CM | POA: Diagnosis not present

## 2015-05-08 DIAGNOSIS — R112 Nausea with vomiting, unspecified: Secondary | ICD-10-CM | POA: Diagnosis not present

## 2015-05-08 DIAGNOSIS — R531 Weakness: Secondary | ICD-10-CM | POA: Diagnosis not present

## 2015-05-08 DIAGNOSIS — E875 Hyperkalemia: Secondary | ICD-10-CM | POA: Diagnosis not present

## 2015-05-08 DIAGNOSIS — D61818 Other pancytopenia: Secondary | ICD-10-CM | POA: Diagnosis not present

## 2015-05-08 DIAGNOSIS — J189 Pneumonia, unspecified organism: Secondary | ICD-10-CM | POA: Diagnosis not present

## 2015-05-08 DIAGNOSIS — R1084 Generalized abdominal pain: Secondary | ICD-10-CM | POA: Diagnosis not present

## 2015-05-08 DIAGNOSIS — R0602 Shortness of breath: Secondary | ICD-10-CM | POA: Diagnosis not present

## 2015-05-08 DIAGNOSIS — K2970 Gastritis, unspecified, without bleeding: Secondary | ICD-10-CM | POA: Diagnosis not present

## 2015-05-08 DIAGNOSIS — R509 Fever, unspecified: Secondary | ICD-10-CM | POA: Diagnosis not present

## 2015-05-08 DIAGNOSIS — Z992 Dependence on renal dialysis: Secondary | ICD-10-CM | POA: Diagnosis not present

## 2015-05-08 DIAGNOSIS — N179 Acute kidney failure, unspecified: Secondary | ICD-10-CM | POA: Diagnosis not present

## 2015-05-08 DIAGNOSIS — I10 Essential (primary) hypertension: Secondary | ICD-10-CM | POA: Diagnosis not present

## 2015-05-08 DIAGNOSIS — K219 Gastro-esophageal reflux disease without esophagitis: Secondary | ICD-10-CM | POA: Diagnosis not present

## 2015-05-08 DIAGNOSIS — N186 End stage renal disease: Secondary | ICD-10-CM | POA: Diagnosis not present

## 2015-05-08 DIAGNOSIS — I120 Hypertensive chronic kidney disease with stage 5 chronic kidney disease or end stage renal disease: Secondary | ICD-10-CM | POA: Diagnosis not present

## 2015-05-09 DIAGNOSIS — Z79899 Other long term (current) drug therapy: Secondary | ICD-10-CM | POA: Diagnosis not present

## 2015-05-09 DIAGNOSIS — R531 Weakness: Secondary | ICD-10-CM | POA: Diagnosis not present

## 2015-05-09 DIAGNOSIS — R9431 Abnormal electrocardiogram [ECG] [EKG]: Secondary | ICD-10-CM | POA: Diagnosis present

## 2015-05-09 DIAGNOSIS — B20 Human immunodeficiency virus [HIV] disease: Secondary | ICD-10-CM | POA: Diagnosis not present

## 2015-05-09 DIAGNOSIS — G4700 Insomnia, unspecified: Secondary | ICD-10-CM | POA: Diagnosis present

## 2015-05-09 DIAGNOSIS — Z8673 Personal history of transient ischemic attack (TIA), and cerebral infarction without residual deficits: Secondary | ICD-10-CM | POA: Diagnosis not present

## 2015-05-09 DIAGNOSIS — Z21 Asymptomatic human immunodeficiency virus [HIV] infection status: Secondary | ICD-10-CM | POA: Diagnosis present

## 2015-05-09 DIAGNOSIS — I272 Other secondary pulmonary hypertension: Secondary | ICD-10-CM | POA: Diagnosis present

## 2015-05-09 DIAGNOSIS — K219 Gastro-esophageal reflux disease without esophagitis: Secondary | ICD-10-CM | POA: Diagnosis present

## 2015-05-09 DIAGNOSIS — R262 Difficulty in walking, not elsewhere classified: Secondary | ICD-10-CM | POA: Diagnosis present

## 2015-05-09 DIAGNOSIS — J189 Pneumonia, unspecified organism: Secondary | ICD-10-CM | POA: Diagnosis present

## 2015-05-09 DIAGNOSIS — Z9115 Patient's noncompliance with renal dialysis: Secondary | ICD-10-CM | POA: Diagnosis not present

## 2015-05-09 DIAGNOSIS — Z880 Allergy status to penicillin: Secondary | ICD-10-CM | POA: Diagnosis not present

## 2015-05-09 DIAGNOSIS — D61818 Other pancytopenia: Secondary | ICD-10-CM | POA: Diagnosis not present

## 2015-05-09 DIAGNOSIS — M898X9 Other specified disorders of bone, unspecified site: Secondary | ICD-10-CM | POA: Diagnosis present

## 2015-05-09 DIAGNOSIS — Z7401 Bed confinement status: Secondary | ICD-10-CM | POA: Diagnosis not present

## 2015-05-09 DIAGNOSIS — R112 Nausea with vomiting, unspecified: Secondary | ICD-10-CM | POA: Diagnosis not present

## 2015-05-09 DIAGNOSIS — Z992 Dependence on renal dialysis: Secondary | ICD-10-CM | POA: Diagnosis not present

## 2015-05-09 DIAGNOSIS — I120 Hypertensive chronic kidney disease with stage 5 chronic kidney disease or end stage renal disease: Secondary | ICD-10-CM | POA: Diagnosis present

## 2015-05-09 DIAGNOSIS — N186 End stage renal disease: Secondary | ICD-10-CM | POA: Diagnosis not present

## 2015-05-09 DIAGNOSIS — R279 Unspecified lack of coordination: Secondary | ICD-10-CM | POA: Diagnosis not present

## 2015-05-09 DIAGNOSIS — R000 Tachycardia, unspecified: Secondary | ICD-10-CM | POA: Diagnosis present

## 2015-05-09 DIAGNOSIS — Z91040 Latex allergy status: Secondary | ICD-10-CM | POA: Diagnosis not present

## 2015-05-09 DIAGNOSIS — Z87891 Personal history of nicotine dependence: Secondary | ICD-10-CM | POA: Diagnosis not present

## 2015-05-09 DIAGNOSIS — Z79891 Long term (current) use of opiate analgesic: Secondary | ICD-10-CM | POA: Diagnosis not present

## 2015-05-09 DIAGNOSIS — D638 Anemia in other chronic diseases classified elsewhere: Secondary | ICD-10-CM | POA: Diagnosis present

## 2015-05-09 DIAGNOSIS — F329 Major depressive disorder, single episode, unspecified: Secondary | ICD-10-CM | POA: Diagnosis present

## 2015-05-09 DIAGNOSIS — I10 Essential (primary) hypertension: Secondary | ICD-10-CM | POA: Diagnosis not present

## 2015-05-09 DIAGNOSIS — G894 Chronic pain syndrome: Secondary | ICD-10-CM | POA: Diagnosis present

## 2015-05-09 DIAGNOSIS — F419 Anxiety disorder, unspecified: Secondary | ICD-10-CM | POA: Diagnosis present

## 2015-05-09 DIAGNOSIS — E875 Hyperkalemia: Secondary | ICD-10-CM | POA: Diagnosis not present

## 2015-05-10 DIAGNOSIS — I10 Essential (primary) hypertension: Secondary | ICD-10-CM | POA: Diagnosis not present

## 2015-05-10 DIAGNOSIS — R531 Weakness: Secondary | ICD-10-CM | POA: Diagnosis not present

## 2015-05-10 DIAGNOSIS — N186 End stage renal disease: Secondary | ICD-10-CM | POA: Diagnosis not present

## 2015-05-10 DIAGNOSIS — B20 Human immunodeficiency virus [HIV] disease: Secondary | ICD-10-CM | POA: Diagnosis not present

## 2015-05-10 DIAGNOSIS — D61818 Other pancytopenia: Secondary | ICD-10-CM | POA: Diagnosis not present

## 2015-05-11 DIAGNOSIS — D61818 Other pancytopenia: Secondary | ICD-10-CM | POA: Diagnosis not present

## 2015-05-11 DIAGNOSIS — B20 Human immunodeficiency virus [HIV] disease: Secondary | ICD-10-CM | POA: Diagnosis not present

## 2015-05-11 DIAGNOSIS — R531 Weakness: Secondary | ICD-10-CM | POA: Diagnosis not present

## 2015-05-11 DIAGNOSIS — N186 End stage renal disease: Secondary | ICD-10-CM | POA: Diagnosis not present

## 2015-05-11 DIAGNOSIS — I10 Essential (primary) hypertension: Secondary | ICD-10-CM | POA: Diagnosis not present

## 2015-05-13 DIAGNOSIS — D472 Monoclonal gammopathy: Secondary | ICD-10-CM | POA: Diagnosis not present

## 2015-05-13 DIAGNOSIS — N2581 Secondary hyperparathyroidism of renal origin: Secondary | ICD-10-CM | POA: Diagnosis not present

## 2015-05-13 DIAGNOSIS — Z992 Dependence on renal dialysis: Secondary | ICD-10-CM | POA: Diagnosis not present

## 2015-05-13 DIAGNOSIS — E611 Iron deficiency: Secondary | ICD-10-CM | POA: Diagnosis not present

## 2015-05-13 DIAGNOSIS — D631 Anemia in chronic kidney disease: Secondary | ICD-10-CM | POA: Diagnosis not present

## 2015-05-13 DIAGNOSIS — N186 End stage renal disease: Secondary | ICD-10-CM | POA: Diagnosis not present

## 2015-05-14 DIAGNOSIS — Z79899 Other long term (current) drug therapy: Secondary | ICD-10-CM | POA: Diagnosis not present

## 2015-05-14 DIAGNOSIS — D649 Anemia, unspecified: Secondary | ICD-10-CM | POA: Diagnosis not present

## 2015-05-15 DIAGNOSIS — D472 Monoclonal gammopathy: Secondary | ICD-10-CM | POA: Diagnosis not present

## 2015-05-15 DIAGNOSIS — Z992 Dependence on renal dialysis: Secondary | ICD-10-CM | POA: Diagnosis not present

## 2015-05-15 DIAGNOSIS — E611 Iron deficiency: Secondary | ICD-10-CM | POA: Diagnosis not present

## 2015-05-15 DIAGNOSIS — D631 Anemia in chronic kidney disease: Secondary | ICD-10-CM | POA: Diagnosis not present

## 2015-05-15 DIAGNOSIS — N2581 Secondary hyperparathyroidism of renal origin: Secondary | ICD-10-CM | POA: Diagnosis not present

## 2015-05-15 DIAGNOSIS — N186 End stage renal disease: Secondary | ICD-10-CM | POA: Diagnosis not present

## 2015-05-17 DIAGNOSIS — D472 Monoclonal gammopathy: Secondary | ICD-10-CM | POA: Diagnosis not present

## 2015-05-17 DIAGNOSIS — E611 Iron deficiency: Secondary | ICD-10-CM | POA: Diagnosis not present

## 2015-05-17 DIAGNOSIS — N186 End stage renal disease: Secondary | ICD-10-CM | POA: Diagnosis not present

## 2015-05-17 DIAGNOSIS — N2581 Secondary hyperparathyroidism of renal origin: Secondary | ICD-10-CM | POA: Diagnosis not present

## 2015-05-17 DIAGNOSIS — Z992 Dependence on renal dialysis: Secondary | ICD-10-CM | POA: Diagnosis not present

## 2015-05-17 DIAGNOSIS — D631 Anemia in chronic kidney disease: Secondary | ICD-10-CM | POA: Diagnosis not present

## 2015-05-20 DIAGNOSIS — N2581 Secondary hyperparathyroidism of renal origin: Secondary | ICD-10-CM | POA: Diagnosis not present

## 2015-05-20 DIAGNOSIS — D472 Monoclonal gammopathy: Secondary | ICD-10-CM | POA: Diagnosis not present

## 2015-05-20 DIAGNOSIS — N186 End stage renal disease: Secondary | ICD-10-CM | POA: Diagnosis not present

## 2015-05-20 DIAGNOSIS — Z992 Dependence on renal dialysis: Secondary | ICD-10-CM | POA: Diagnosis not present

## 2015-05-20 DIAGNOSIS — E611 Iron deficiency: Secondary | ICD-10-CM | POA: Diagnosis not present

## 2015-05-20 DIAGNOSIS — D631 Anemia in chronic kidney disease: Secondary | ICD-10-CM | POA: Diagnosis not present

## 2015-05-22 DIAGNOSIS — Z992 Dependence on renal dialysis: Secondary | ICD-10-CM | POA: Diagnosis not present

## 2015-05-22 DIAGNOSIS — D631 Anemia in chronic kidney disease: Secondary | ICD-10-CM | POA: Diagnosis not present

## 2015-05-22 DIAGNOSIS — D472 Monoclonal gammopathy: Secondary | ICD-10-CM | POA: Diagnosis not present

## 2015-05-22 DIAGNOSIS — E611 Iron deficiency: Secondary | ICD-10-CM | POA: Diagnosis not present

## 2015-05-22 DIAGNOSIS — N2581 Secondary hyperparathyroidism of renal origin: Secondary | ICD-10-CM | POA: Diagnosis not present

## 2015-05-22 DIAGNOSIS — N186 End stage renal disease: Secondary | ICD-10-CM | POA: Diagnosis not present

## 2015-05-23 DIAGNOSIS — G894 Chronic pain syndrome: Secondary | ICD-10-CM | POA: Diagnosis not present

## 2015-05-24 DIAGNOSIS — E611 Iron deficiency: Secondary | ICD-10-CM | POA: Diagnosis not present

## 2015-05-24 DIAGNOSIS — D631 Anemia in chronic kidney disease: Secondary | ICD-10-CM | POA: Diagnosis not present

## 2015-05-24 DIAGNOSIS — N2581 Secondary hyperparathyroidism of renal origin: Secondary | ICD-10-CM | POA: Diagnosis not present

## 2015-05-24 DIAGNOSIS — N186 End stage renal disease: Secondary | ICD-10-CM | POA: Diagnosis not present

## 2015-05-24 DIAGNOSIS — Z992 Dependence on renal dialysis: Secondary | ICD-10-CM | POA: Diagnosis not present

## 2015-05-24 DIAGNOSIS — D472 Monoclonal gammopathy: Secondary | ICD-10-CM | POA: Diagnosis not present

## 2015-05-27 DIAGNOSIS — N186 End stage renal disease: Secondary | ICD-10-CM | POA: Diagnosis not present

## 2015-05-27 DIAGNOSIS — N2581 Secondary hyperparathyroidism of renal origin: Secondary | ICD-10-CM | POA: Diagnosis not present

## 2015-05-27 DIAGNOSIS — D631 Anemia in chronic kidney disease: Secondary | ICD-10-CM | POA: Diagnosis not present

## 2015-05-27 DIAGNOSIS — D472 Monoclonal gammopathy: Secondary | ICD-10-CM | POA: Diagnosis not present

## 2015-05-27 DIAGNOSIS — E611 Iron deficiency: Secondary | ICD-10-CM | POA: Diagnosis not present

## 2015-05-27 DIAGNOSIS — Z992 Dependence on renal dialysis: Secondary | ICD-10-CM | POA: Diagnosis not present

## 2015-05-28 DIAGNOSIS — F331 Major depressive disorder, recurrent, moderate: Secondary | ICD-10-CM | POA: Diagnosis not present

## 2015-05-28 DIAGNOSIS — F411 Generalized anxiety disorder: Secondary | ICD-10-CM | POA: Diagnosis not present

## 2015-05-29 ENCOUNTER — Ambulatory Visit: Payer: Medicare Other | Admitting: Infectious Diseases

## 2015-05-29 DIAGNOSIS — E611 Iron deficiency: Secondary | ICD-10-CM | POA: Diagnosis not present

## 2015-05-29 DIAGNOSIS — N186 End stage renal disease: Secondary | ICD-10-CM | POA: Diagnosis not present

## 2015-05-29 DIAGNOSIS — Z992 Dependence on renal dialysis: Secondary | ICD-10-CM | POA: Diagnosis not present

## 2015-05-29 DIAGNOSIS — D472 Monoclonal gammopathy: Secondary | ICD-10-CM | POA: Diagnosis not present

## 2015-05-29 DIAGNOSIS — N2581 Secondary hyperparathyroidism of renal origin: Secondary | ICD-10-CM | POA: Diagnosis not present

## 2015-05-29 DIAGNOSIS — D631 Anemia in chronic kidney disease: Secondary | ICD-10-CM | POA: Diagnosis not present

## 2015-05-31 DIAGNOSIS — D631 Anemia in chronic kidney disease: Secondary | ICD-10-CM | POA: Diagnosis not present

## 2015-05-31 DIAGNOSIS — Z992 Dependence on renal dialysis: Secondary | ICD-10-CM | POA: Diagnosis not present

## 2015-05-31 DIAGNOSIS — N2581 Secondary hyperparathyroidism of renal origin: Secondary | ICD-10-CM | POA: Diagnosis not present

## 2015-05-31 DIAGNOSIS — D472 Monoclonal gammopathy: Secondary | ICD-10-CM | POA: Diagnosis not present

## 2015-05-31 DIAGNOSIS — N186 End stage renal disease: Secondary | ICD-10-CM | POA: Diagnosis not present

## 2015-05-31 DIAGNOSIS — E611 Iron deficiency: Secondary | ICD-10-CM | POA: Diagnosis not present

## 2015-06-03 DIAGNOSIS — Z992 Dependence on renal dialysis: Secondary | ICD-10-CM | POA: Diagnosis not present

## 2015-06-03 DIAGNOSIS — N186 End stage renal disease: Secondary | ICD-10-CM | POA: Diagnosis not present

## 2015-06-03 DIAGNOSIS — N2581 Secondary hyperparathyroidism of renal origin: Secondary | ICD-10-CM | POA: Diagnosis not present

## 2015-06-03 DIAGNOSIS — D631 Anemia in chronic kidney disease: Secondary | ICD-10-CM | POA: Diagnosis not present

## 2015-06-03 DIAGNOSIS — D472 Monoclonal gammopathy: Secondary | ICD-10-CM | POA: Diagnosis not present

## 2015-06-03 DIAGNOSIS — R112 Nausea with vomiting, unspecified: Secondary | ICD-10-CM | POA: Diagnosis not present

## 2015-06-05 ENCOUNTER — Telehealth: Payer: Self-pay | Admitting: *Deleted

## 2015-06-05 DIAGNOSIS — D472 Monoclonal gammopathy: Secondary | ICD-10-CM | POA: Diagnosis not present

## 2015-06-05 DIAGNOSIS — Z992 Dependence on renal dialysis: Secondary | ICD-10-CM | POA: Diagnosis not present

## 2015-06-05 DIAGNOSIS — N186 End stage renal disease: Secondary | ICD-10-CM | POA: Diagnosis not present

## 2015-06-05 DIAGNOSIS — N2581 Secondary hyperparathyroidism of renal origin: Secondary | ICD-10-CM | POA: Diagnosis not present

## 2015-06-05 DIAGNOSIS — R112 Nausea with vomiting, unspecified: Secondary | ICD-10-CM | POA: Diagnosis not present

## 2015-06-05 DIAGNOSIS — D631 Anemia in chronic kidney disease: Secondary | ICD-10-CM | POA: Diagnosis not present

## 2015-06-05 NOTE — Telephone Encounter (Signed)
Claudean Kinds, RN from SNF had called previously for lab orders to be drawn before patient's appointment 5/23.  Orders written and faxed for labs, but also included medications. Patient currently on epivir, retrovir, isentress. New orders list epivir, retrovir, and tivicay in place of isentress.  SNF RN  Called for clarification to see if this is the patient's new HIV regimen, or if the isentress should be continued along with the epivir, retrovir and tivicay. Please advise. Landis Gandy, RN

## 2015-06-05 NOTE — Telephone Encounter (Signed)
Please continue epivir, retrovir, tivicay.  Issentres does not need to be continued.

## 2015-06-06 DIAGNOSIS — E875 Hyperkalemia: Secondary | ICD-10-CM | POA: Diagnosis not present

## 2015-06-06 DIAGNOSIS — I10 Essential (primary) hypertension: Secondary | ICD-10-CM | POA: Diagnosis not present

## 2015-06-06 DIAGNOSIS — R531 Weakness: Secondary | ICD-10-CM | POA: Diagnosis not present

## 2015-06-06 DIAGNOSIS — K219 Gastro-esophageal reflux disease without esophagitis: Secondary | ICD-10-CM | POA: Diagnosis not present

## 2015-06-06 NOTE — Telephone Encounter (Signed)
Relayed verbal orders per Dr. Johnnye Sima.

## 2015-06-07 DIAGNOSIS — D631 Anemia in chronic kidney disease: Secondary | ICD-10-CM | POA: Diagnosis not present

## 2015-06-07 DIAGNOSIS — Z992 Dependence on renal dialysis: Secondary | ICD-10-CM | POA: Diagnosis not present

## 2015-06-07 DIAGNOSIS — D472 Monoclonal gammopathy: Secondary | ICD-10-CM | POA: Diagnosis not present

## 2015-06-07 DIAGNOSIS — R112 Nausea with vomiting, unspecified: Secondary | ICD-10-CM | POA: Diagnosis not present

## 2015-06-07 DIAGNOSIS — N186 End stage renal disease: Secondary | ICD-10-CM | POA: Diagnosis not present

## 2015-06-07 DIAGNOSIS — N2581 Secondary hyperparathyroidism of renal origin: Secondary | ICD-10-CM | POA: Diagnosis not present

## 2015-06-10 DIAGNOSIS — F331 Major depressive disorder, recurrent, moderate: Secondary | ICD-10-CM | POA: Diagnosis not present

## 2015-06-10 DIAGNOSIS — F411 Generalized anxiety disorder: Secondary | ICD-10-CM | POA: Diagnosis not present

## 2015-06-11 DIAGNOSIS — N186 End stage renal disease: Secondary | ICD-10-CM | POA: Diagnosis not present

## 2015-06-13 DIAGNOSIS — R601 Generalized edema: Secondary | ICD-10-CM | POA: Diagnosis not present

## 2015-06-14 DIAGNOSIS — N2581 Secondary hyperparathyroidism of renal origin: Secondary | ICD-10-CM | POA: Diagnosis not present

## 2015-06-14 DIAGNOSIS — N186 End stage renal disease: Secondary | ICD-10-CM | POA: Diagnosis not present

## 2015-06-14 DIAGNOSIS — D631 Anemia in chronic kidney disease: Secondary | ICD-10-CM | POA: Diagnosis not present

## 2015-06-14 DIAGNOSIS — D518 Other vitamin B12 deficiency anemias: Secondary | ICD-10-CM | POA: Diagnosis not present

## 2015-06-14 DIAGNOSIS — R112 Nausea with vomiting, unspecified: Secondary | ICD-10-CM | POA: Diagnosis not present

## 2015-06-14 DIAGNOSIS — Z992 Dependence on renal dialysis: Secondary | ICD-10-CM | POA: Diagnosis not present

## 2015-06-14 DIAGNOSIS — D472 Monoclonal gammopathy: Secondary | ICD-10-CM | POA: Diagnosis not present

## 2015-06-17 DIAGNOSIS — R112 Nausea with vomiting, unspecified: Secondary | ICD-10-CM | POA: Diagnosis not present

## 2015-06-17 DIAGNOSIS — Z992 Dependence on renal dialysis: Secondary | ICD-10-CM | POA: Diagnosis not present

## 2015-06-17 DIAGNOSIS — D472 Monoclonal gammopathy: Secondary | ICD-10-CM | POA: Diagnosis not present

## 2015-06-17 DIAGNOSIS — D631 Anemia in chronic kidney disease: Secondary | ICD-10-CM | POA: Diagnosis not present

## 2015-06-17 DIAGNOSIS — N2581 Secondary hyperparathyroidism of renal origin: Secondary | ICD-10-CM | POA: Diagnosis not present

## 2015-06-17 DIAGNOSIS — N186 End stage renal disease: Secondary | ICD-10-CM | POA: Diagnosis not present

## 2015-06-18 DIAGNOSIS — N186 End stage renal disease: Secondary | ICD-10-CM | POA: Diagnosis not present

## 2015-06-19 DIAGNOSIS — N186 End stage renal disease: Secondary | ICD-10-CM | POA: Diagnosis not present

## 2015-06-19 DIAGNOSIS — N2581 Secondary hyperparathyroidism of renal origin: Secondary | ICD-10-CM | POA: Diagnosis not present

## 2015-06-19 DIAGNOSIS — D631 Anemia in chronic kidney disease: Secondary | ICD-10-CM | POA: Diagnosis not present

## 2015-06-19 DIAGNOSIS — D472 Monoclonal gammopathy: Secondary | ICD-10-CM | POA: Diagnosis not present

## 2015-06-19 DIAGNOSIS — Z992 Dependence on renal dialysis: Secondary | ICD-10-CM | POA: Diagnosis not present

## 2015-06-19 DIAGNOSIS — R112 Nausea with vomiting, unspecified: Secondary | ICD-10-CM | POA: Diagnosis not present

## 2015-06-20 DIAGNOSIS — G894 Chronic pain syndrome: Secondary | ICD-10-CM | POA: Diagnosis not present

## 2015-06-21 DIAGNOSIS — R112 Nausea with vomiting, unspecified: Secondary | ICD-10-CM | POA: Diagnosis not present

## 2015-06-21 DIAGNOSIS — N2581 Secondary hyperparathyroidism of renal origin: Secondary | ICD-10-CM | POA: Diagnosis not present

## 2015-06-21 DIAGNOSIS — N186 End stage renal disease: Secondary | ICD-10-CM | POA: Diagnosis not present

## 2015-06-21 DIAGNOSIS — Z992 Dependence on renal dialysis: Secondary | ICD-10-CM | POA: Diagnosis not present

## 2015-06-21 DIAGNOSIS — D472 Monoclonal gammopathy: Secondary | ICD-10-CM | POA: Diagnosis not present

## 2015-06-21 DIAGNOSIS — D631 Anemia in chronic kidney disease: Secondary | ICD-10-CM | POA: Diagnosis not present

## 2015-06-23 DIAGNOSIS — N186 End stage renal disease: Secondary | ICD-10-CM | POA: Diagnosis not present

## 2015-06-24 DIAGNOSIS — N2581 Secondary hyperparathyroidism of renal origin: Secondary | ICD-10-CM | POA: Diagnosis not present

## 2015-06-24 DIAGNOSIS — D631 Anemia in chronic kidney disease: Secondary | ICD-10-CM | POA: Diagnosis not present

## 2015-06-24 DIAGNOSIS — Z992 Dependence on renal dialysis: Secondary | ICD-10-CM | POA: Diagnosis not present

## 2015-06-24 DIAGNOSIS — R112 Nausea with vomiting, unspecified: Secondary | ICD-10-CM | POA: Diagnosis not present

## 2015-06-24 DIAGNOSIS — D472 Monoclonal gammopathy: Secondary | ICD-10-CM | POA: Diagnosis not present

## 2015-06-24 DIAGNOSIS — N186 End stage renal disease: Secondary | ICD-10-CM | POA: Diagnosis not present

## 2015-06-25 DIAGNOSIS — N186 End stage renal disease: Secondary | ICD-10-CM | POA: Diagnosis not present

## 2015-06-26 DIAGNOSIS — N186 End stage renal disease: Secondary | ICD-10-CM | POA: Diagnosis not present

## 2015-06-26 DIAGNOSIS — N2581 Secondary hyperparathyroidism of renal origin: Secondary | ICD-10-CM | POA: Diagnosis not present

## 2015-06-26 DIAGNOSIS — D631 Anemia in chronic kidney disease: Secondary | ICD-10-CM | POA: Diagnosis not present

## 2015-06-26 DIAGNOSIS — D472 Monoclonal gammopathy: Secondary | ICD-10-CM | POA: Diagnosis not present

## 2015-06-26 DIAGNOSIS — R112 Nausea with vomiting, unspecified: Secondary | ICD-10-CM | POA: Diagnosis not present

## 2015-06-26 DIAGNOSIS — Z992 Dependence on renal dialysis: Secondary | ICD-10-CM | POA: Diagnosis not present

## 2015-06-29 DIAGNOSIS — N186 End stage renal disease: Secondary | ICD-10-CM | POA: Diagnosis not present

## 2015-06-30 DIAGNOSIS — Z992 Dependence on renal dialysis: Secondary | ICD-10-CM | POA: Diagnosis not present

## 2015-06-30 DIAGNOSIS — N186 End stage renal disease: Secondary | ICD-10-CM | POA: Diagnosis not present

## 2015-07-01 DIAGNOSIS — D472 Monoclonal gammopathy: Secondary | ICD-10-CM | POA: Diagnosis not present

## 2015-07-01 DIAGNOSIS — N2581 Secondary hyperparathyroidism of renal origin: Secondary | ICD-10-CM | POA: Diagnosis not present

## 2015-07-01 DIAGNOSIS — E611 Iron deficiency: Secondary | ICD-10-CM | POA: Diagnosis not present

## 2015-07-01 DIAGNOSIS — N186 End stage renal disease: Secondary | ICD-10-CM | POA: Diagnosis not present

## 2015-07-01 DIAGNOSIS — Z992 Dependence on renal dialysis: Secondary | ICD-10-CM | POA: Diagnosis not present

## 2015-07-01 DIAGNOSIS — R112 Nausea with vomiting, unspecified: Secondary | ICD-10-CM | POA: Diagnosis not present

## 2015-07-01 DIAGNOSIS — D631 Anemia in chronic kidney disease: Secondary | ICD-10-CM | POA: Diagnosis not present

## 2015-07-02 DIAGNOSIS — N186 End stage renal disease: Secondary | ICD-10-CM | POA: Diagnosis not present

## 2015-07-03 DIAGNOSIS — E611 Iron deficiency: Secondary | ICD-10-CM | POA: Diagnosis not present

## 2015-07-03 DIAGNOSIS — D472 Monoclonal gammopathy: Secondary | ICD-10-CM | POA: Diagnosis not present

## 2015-07-03 DIAGNOSIS — N186 End stage renal disease: Secondary | ICD-10-CM | POA: Diagnosis not present

## 2015-07-03 DIAGNOSIS — R112 Nausea with vomiting, unspecified: Secondary | ICD-10-CM | POA: Diagnosis not present

## 2015-07-03 DIAGNOSIS — D631 Anemia in chronic kidney disease: Secondary | ICD-10-CM | POA: Diagnosis not present

## 2015-07-03 DIAGNOSIS — N2581 Secondary hyperparathyroidism of renal origin: Secondary | ICD-10-CM | POA: Diagnosis not present

## 2015-07-04 DIAGNOSIS — N186 End stage renal disease: Secondary | ICD-10-CM | POA: Diagnosis not present

## 2015-07-05 DIAGNOSIS — N186 End stage renal disease: Secondary | ICD-10-CM | POA: Diagnosis not present

## 2015-07-08 DIAGNOSIS — I10 Essential (primary) hypertension: Secondary | ICD-10-CM | POA: Diagnosis not present

## 2015-07-08 DIAGNOSIS — N186 End stage renal disease: Secondary | ICD-10-CM | POA: Diagnosis not present

## 2015-07-08 DIAGNOSIS — R531 Weakness: Secondary | ICD-10-CM | POA: Diagnosis not present

## 2015-07-08 DIAGNOSIS — N2581 Secondary hyperparathyroidism of renal origin: Secondary | ICD-10-CM | POA: Diagnosis not present

## 2015-07-08 DIAGNOSIS — K219 Gastro-esophageal reflux disease without esophagitis: Secondary | ICD-10-CM | POA: Diagnosis not present

## 2015-07-08 DIAGNOSIS — R112 Nausea with vomiting, unspecified: Secondary | ICD-10-CM | POA: Diagnosis not present

## 2015-07-08 DIAGNOSIS — E611 Iron deficiency: Secondary | ICD-10-CM | POA: Diagnosis not present

## 2015-07-08 DIAGNOSIS — D472 Monoclonal gammopathy: Secondary | ICD-10-CM | POA: Diagnosis not present

## 2015-07-08 DIAGNOSIS — E875 Hyperkalemia: Secondary | ICD-10-CM | POA: Diagnosis not present

## 2015-07-08 DIAGNOSIS — D631 Anemia in chronic kidney disease: Secondary | ICD-10-CM | POA: Diagnosis not present

## 2015-07-09 DIAGNOSIS — N186 End stage renal disease: Secondary | ICD-10-CM | POA: Diagnosis not present

## 2015-07-10 DIAGNOSIS — D472 Monoclonal gammopathy: Secondary | ICD-10-CM | POA: Diagnosis not present

## 2015-07-10 DIAGNOSIS — E611 Iron deficiency: Secondary | ICD-10-CM | POA: Diagnosis not present

## 2015-07-10 DIAGNOSIS — N2581 Secondary hyperparathyroidism of renal origin: Secondary | ICD-10-CM | POA: Diagnosis not present

## 2015-07-10 DIAGNOSIS — R112 Nausea with vomiting, unspecified: Secondary | ICD-10-CM | POA: Diagnosis not present

## 2015-07-10 DIAGNOSIS — N186 End stage renal disease: Secondary | ICD-10-CM | POA: Diagnosis not present

## 2015-07-10 DIAGNOSIS — D631 Anemia in chronic kidney disease: Secondary | ICD-10-CM | POA: Diagnosis not present

## 2015-07-12 DIAGNOSIS — N186 End stage renal disease: Secondary | ICD-10-CM | POA: Diagnosis not present

## 2015-07-12 DIAGNOSIS — E611 Iron deficiency: Secondary | ICD-10-CM | POA: Diagnosis not present

## 2015-07-12 DIAGNOSIS — D631 Anemia in chronic kidney disease: Secondary | ICD-10-CM | POA: Diagnosis not present

## 2015-07-12 DIAGNOSIS — N2581 Secondary hyperparathyroidism of renal origin: Secondary | ICD-10-CM | POA: Diagnosis not present

## 2015-07-12 DIAGNOSIS — D472 Monoclonal gammopathy: Secondary | ICD-10-CM | POA: Diagnosis not present

## 2015-07-12 DIAGNOSIS — R112 Nausea with vomiting, unspecified: Secondary | ICD-10-CM | POA: Diagnosis not present

## 2015-07-17 DIAGNOSIS — R112 Nausea with vomiting, unspecified: Secondary | ICD-10-CM | POA: Diagnosis not present

## 2015-07-17 DIAGNOSIS — D631 Anemia in chronic kidney disease: Secondary | ICD-10-CM | POA: Diagnosis not present

## 2015-07-17 DIAGNOSIS — N2581 Secondary hyperparathyroidism of renal origin: Secondary | ICD-10-CM | POA: Diagnosis not present

## 2015-07-17 DIAGNOSIS — G894 Chronic pain syndrome: Secondary | ICD-10-CM | POA: Diagnosis not present

## 2015-07-17 DIAGNOSIS — D472 Monoclonal gammopathy: Secondary | ICD-10-CM | POA: Diagnosis not present

## 2015-07-17 DIAGNOSIS — N186 End stage renal disease: Secondary | ICD-10-CM | POA: Diagnosis not present

## 2015-07-17 DIAGNOSIS — E611 Iron deficiency: Secondary | ICD-10-CM | POA: Diagnosis not present

## 2015-07-18 DIAGNOSIS — D649 Anemia, unspecified: Secondary | ICD-10-CM | POA: Diagnosis not present

## 2015-07-19 DIAGNOSIS — N2581 Secondary hyperparathyroidism of renal origin: Secondary | ICD-10-CM | POA: Diagnosis not present

## 2015-07-19 DIAGNOSIS — N186 End stage renal disease: Secondary | ICD-10-CM | POA: Diagnosis not present

## 2015-07-19 DIAGNOSIS — D631 Anemia in chronic kidney disease: Secondary | ICD-10-CM | POA: Diagnosis not present

## 2015-07-19 DIAGNOSIS — D472 Monoclonal gammopathy: Secondary | ICD-10-CM | POA: Diagnosis not present

## 2015-07-19 DIAGNOSIS — R112 Nausea with vomiting, unspecified: Secondary | ICD-10-CM | POA: Diagnosis not present

## 2015-07-19 DIAGNOSIS — E611 Iron deficiency: Secondary | ICD-10-CM | POA: Diagnosis not present

## 2015-07-22 ENCOUNTER — Ambulatory Visit: Payer: Medicare Other | Admitting: Infectious Diseases

## 2015-07-22 DIAGNOSIS — D631 Anemia in chronic kidney disease: Secondary | ICD-10-CM | POA: Diagnosis not present

## 2015-07-22 DIAGNOSIS — D472 Monoclonal gammopathy: Secondary | ICD-10-CM | POA: Diagnosis not present

## 2015-07-22 DIAGNOSIS — N2581 Secondary hyperparathyroidism of renal origin: Secondary | ICD-10-CM | POA: Diagnosis not present

## 2015-07-22 DIAGNOSIS — R112 Nausea with vomiting, unspecified: Secondary | ICD-10-CM | POA: Diagnosis not present

## 2015-07-22 DIAGNOSIS — E611 Iron deficiency: Secondary | ICD-10-CM | POA: Diagnosis not present

## 2015-07-22 DIAGNOSIS — N186 End stage renal disease: Secondary | ICD-10-CM | POA: Diagnosis not present

## 2015-07-23 ENCOUNTER — Ambulatory Visit (INDEPENDENT_AMBULATORY_CARE_PROVIDER_SITE_OTHER): Payer: Medicare Other | Admitting: Infectious Diseases

## 2015-07-23 ENCOUNTER — Ambulatory Visit: Payer: Medicare Other | Admitting: *Deleted

## 2015-07-23 ENCOUNTER — Encounter: Payer: Self-pay | Admitting: Infectious Diseases

## 2015-07-23 VITALS — BP 172/118 | HR 66 | Temp 98.9°F | Ht 61.0 in | Wt 110.0 lb

## 2015-07-23 DIAGNOSIS — Z992 Dependence on renal dialysis: Secondary | ICD-10-CM | POA: Diagnosis not present

## 2015-07-23 DIAGNOSIS — Z79899 Other long term (current) drug therapy: Secondary | ICD-10-CM | POA: Diagnosis not present

## 2015-07-23 DIAGNOSIS — B20 Human immunodeficiency virus [HIV] disease: Secondary | ICD-10-CM

## 2015-07-23 DIAGNOSIS — Z21 Asymptomatic human immunodeficiency virus [HIV] infection status: Secondary | ICD-10-CM | POA: Diagnosis not present

## 2015-07-23 DIAGNOSIS — N186 End stage renal disease: Secondary | ICD-10-CM

## 2015-07-23 DIAGNOSIS — I10 Essential (primary) hypertension: Secondary | ICD-10-CM

## 2015-07-23 DIAGNOSIS — Z113 Encounter for screening for infections with a predominantly sexual mode of transmission: Secondary | ICD-10-CM | POA: Diagnosis not present

## 2015-07-23 DIAGNOSIS — F411 Generalized anxiety disorder: Secondary | ICD-10-CM

## 2015-07-23 LAB — COMPREHENSIVE METABOLIC PANEL
ALBUMIN: 3.9 g/dL (ref 3.6–5.1)
ALT: 5 U/L — ABNORMAL LOW (ref 6–29)
AST: 13 U/L (ref 10–35)
Alkaline Phosphatase: 141 U/L — ABNORMAL HIGH (ref 33–130)
BILIRUBIN TOTAL: 0.4 mg/dL (ref 0.2–1.2)
BUN: 33 mg/dL — AB (ref 7–25)
CHLORIDE: 99 mmol/L (ref 98–110)
CO2: 27 mmol/L (ref 20–31)
CREATININE: 7.12 mg/dL — AB (ref 0.50–0.99)
Calcium: 9.7 mg/dL (ref 8.6–10.4)
Glucose, Bld: 103 mg/dL — ABNORMAL HIGH (ref 65–99)
Potassium: 4.5 mmol/L (ref 3.5–5.3)
SODIUM: 140 mmol/L (ref 135–146)
Total Protein: 8.1 g/dL (ref 6.1–8.1)

## 2015-07-23 LAB — LIPID PANEL
CHOLESTEROL: 147 mg/dL (ref 125–200)
HDL: 54 mg/dL (ref >=46)
LDL CALC: 66 mg/dL (ref <130)
TRIGLYCERIDES: 136 mg/dL (ref <150)
Total CHOL/HDL Ratio: 2.7 Ratio (ref <=5.0)
VLDL: 27 mg/dL (ref <30)

## 2015-07-23 LAB — CBC
HEMATOCRIT: 35.5 % (ref 35.0–45.0)
Hemoglobin: 11 g/dL — ABNORMAL LOW (ref 11.7–15.5)
MCH: 32.1 pg (ref 27.0–33.0)
MCHC: 31 g/dL — AB (ref 32.0–36.0)
MCV: 103.5 fL — AB (ref 80.0–100.0)
MPV: 9.8 fL (ref 7.5–12.5)
PLATELETS: 137 10*3/uL — AB (ref 140–400)
RBC: 3.43 MIL/uL — ABNORMAL LOW (ref 3.80–5.10)
RDW: 16.7 % — AB (ref 11.0–15.0)
WBC: 3 10*3/uL — AB (ref 3.8–10.8)

## 2015-07-23 NOTE — Progress Notes (Signed)
   Subjective:    Patient ID: [name], female    DOB: 1950/03/16, 65 y.o.   MRN: [id number]  HPI 65 yo F with HIV+, ESRD, diastolic CHF, afib, staying at SNF. She is on DTGV/AZT/3TC dosed for her HD. Is staying in assisted living facility.   HIV 1 RNA QUANT (copies/mL)  Date Value  02/12/2015 <20  03/26/2014 <20  01/21/2009    <48 HIV Genotype cannot be performed due to low viral load.   CD4 T CELL ABS (/uL)  Date Value  02/12/2015 280*  03/26/2014 140*  02/20/2014 300*    Has not had labs done.  Has been feeling ok.  Has not noticed any difficulties with her BP. C/o R arm swelling. Has had for ? Period. Intermittent.  Has felt sick with HD (vomitting). No problems with HD fistula.   Can't remember last mammo/pap. Thinks maybe was done in [place], [place]?   Review of Systems  Constitutional: Negative for appetite change and unexpected weight change.  Gastrointestinal: Negative for diarrhea and constipation.  Genitourinary: Negative for difficulty urinating.  saw stars over last month, discussed with her "eye doctor" who told her not to worry about it.      Objective:   Physical Exam  Constitutional: She appears well-developed and well-nourished.  HENT:  Mouth/Throat: No oropharyngeal exudate.  Eyes: EOM are normal.  Neck: Neck supple.  Cardiovascular: Normal rate, regular rhythm and normal heart sounds.   Pulmonary/Chest: Effort normal and breath sounds normal.  Abdominal: Soft. Bowel sounds are normal. There is no tenderness. There is no rebound.  Musculoskeletal: She exhibits no edema.       Arms: Lymphadenopathy:    She has no cervical adenopathy.      Assessment & Plan:

## 2015-07-23 NOTE — BH Specialist Note (Signed)
Counselor met with [name] today as a warm hand off from medical staff.  Patient was oriented times four with flat affect but appropriate dress.  Patient was accompanied by her care taker.  Patient gave little eye contact and was soft spoken.  Patient shared that she presently has no issues that would required counseling services at this time.  Counselor provided a business card and encouraged patient to call and make an appointment if there became a need. Counselor provided support and encouragement accordingly.   Rolena Infante, MA, LPC [facility]

## 2015-07-23 NOTE — Assessment & Plan Note (Signed)
wil f/u with her PCP at her SNF, she is currently asx.

## 2015-07-23 NOTE — Assessment & Plan Note (Signed)
Shew appears to be doing well.  Will continue her current meds Will send her to lab today.  Will see her back in 6 months.  PAP, Mammo and colon per primary.

## 2015-07-23 NOTE — Assessment & Plan Note (Signed)
She will f/u with [facility] in [place].  She will f/u with her pcp re: her BP.

## 2015-07-24 DIAGNOSIS — D631 Anemia in chronic kidney disease: Secondary | ICD-10-CM | POA: Diagnosis not present

## 2015-07-24 DIAGNOSIS — D472 Monoclonal gammopathy: Secondary | ICD-10-CM | POA: Diagnosis not present

## 2015-07-24 DIAGNOSIS — R112 Nausea with vomiting, unspecified: Secondary | ICD-10-CM | POA: Diagnosis not present

## 2015-07-24 DIAGNOSIS — N2581 Secondary hyperparathyroidism of renal origin: Secondary | ICD-10-CM | POA: Diagnosis not present

## 2015-07-24 DIAGNOSIS — E611 Iron deficiency: Secondary | ICD-10-CM | POA: Diagnosis not present

## 2015-07-24 DIAGNOSIS — N186 End stage renal disease: Secondary | ICD-10-CM | POA: Diagnosis not present

## 2015-07-24 LAB — HIV-1 RNA QUANT-NO REFLEX-BLD
HIV 1 RNA Quant: 51 copies/mL — ABNORMAL HIGH (ref <20)
HIV-1 RNA QUANT, LOG: 1.71 {Log_copies}/mL — AB (ref <1.30)

## 2015-07-24 LAB — RPR

## 2015-07-25 LAB — T-HELPER CELL (CD4) - (RCID CLINIC ONLY)
CD4 T CELL HELPER: 23 % — AB (ref 33–55)
CD4 T Cell Abs: 250 /uL — ABNORMAL LOW (ref 400–2700)

## 2015-07-26 DIAGNOSIS — D472 Monoclonal gammopathy: Secondary | ICD-10-CM | POA: Diagnosis not present

## 2015-07-26 DIAGNOSIS — D631 Anemia in chronic kidney disease: Secondary | ICD-10-CM | POA: Diagnosis not present

## 2015-07-26 DIAGNOSIS — N2581 Secondary hyperparathyroidism of renal origin: Secondary | ICD-10-CM | POA: Diagnosis not present

## 2015-07-26 DIAGNOSIS — E611 Iron deficiency: Secondary | ICD-10-CM | POA: Diagnosis not present

## 2015-07-26 DIAGNOSIS — N186 End stage renal disease: Secondary | ICD-10-CM | POA: Diagnosis not present

## 2015-07-26 DIAGNOSIS — R112 Nausea with vomiting, unspecified: Secondary | ICD-10-CM | POA: Diagnosis not present

## 2015-07-29 DIAGNOSIS — N186 End stage renal disease: Secondary | ICD-10-CM | POA: Diagnosis not present

## 2015-07-29 DIAGNOSIS — R112 Nausea with vomiting, unspecified: Secondary | ICD-10-CM | POA: Diagnosis not present

## 2015-07-29 DIAGNOSIS — D472 Monoclonal gammopathy: Secondary | ICD-10-CM | POA: Diagnosis not present

## 2015-07-29 DIAGNOSIS — N2581 Secondary hyperparathyroidism of renal origin: Secondary | ICD-10-CM | POA: Diagnosis not present

## 2015-07-29 DIAGNOSIS — D631 Anemia in chronic kidney disease: Secondary | ICD-10-CM | POA: Diagnosis not present

## 2015-07-29 DIAGNOSIS — E611 Iron deficiency: Secondary | ICD-10-CM | POA: Diagnosis not present

## 2015-07-31 DIAGNOSIS — D631 Anemia in chronic kidney disease: Secondary | ICD-10-CM | POA: Diagnosis not present

## 2015-07-31 DIAGNOSIS — R112 Nausea with vomiting, unspecified: Secondary | ICD-10-CM | POA: Diagnosis not present

## 2015-07-31 DIAGNOSIS — E611 Iron deficiency: Secondary | ICD-10-CM | POA: Diagnosis not present

## 2015-07-31 DIAGNOSIS — N186 End stage renal disease: Secondary | ICD-10-CM | POA: Diagnosis not present

## 2015-07-31 DIAGNOSIS — Z992 Dependence on renal dialysis: Secondary | ICD-10-CM | POA: Diagnosis not present

## 2015-07-31 DIAGNOSIS — D472 Monoclonal gammopathy: Secondary | ICD-10-CM | POA: Diagnosis not present

## 2015-07-31 DIAGNOSIS — N2581 Secondary hyperparathyroidism of renal origin: Secondary | ICD-10-CM | POA: Diagnosis not present

## 2015-08-02 DIAGNOSIS — E611 Iron deficiency: Secondary | ICD-10-CM | POA: Diagnosis not present

## 2015-08-02 DIAGNOSIS — D472 Monoclonal gammopathy: Secondary | ICD-10-CM | POA: Diagnosis not present

## 2015-08-02 DIAGNOSIS — N186 End stage renal disease: Secondary | ICD-10-CM | POA: Diagnosis not present

## 2015-08-02 DIAGNOSIS — Z992 Dependence on renal dialysis: Secondary | ICD-10-CM | POA: Diagnosis not present

## 2015-08-02 DIAGNOSIS — D631 Anemia in chronic kidney disease: Secondary | ICD-10-CM | POA: Diagnosis not present

## 2015-08-02 DIAGNOSIS — R112 Nausea with vomiting, unspecified: Secondary | ICD-10-CM | POA: Diagnosis not present

## 2015-08-02 DIAGNOSIS — N2581 Secondary hyperparathyroidism of renal origin: Secondary | ICD-10-CM | POA: Diagnosis not present

## 2015-08-05 DIAGNOSIS — N2581 Secondary hyperparathyroidism of renal origin: Secondary | ICD-10-CM | POA: Diagnosis not present

## 2015-08-05 DIAGNOSIS — E611 Iron deficiency: Secondary | ICD-10-CM | POA: Diagnosis not present

## 2015-08-05 DIAGNOSIS — N186 End stage renal disease: Secondary | ICD-10-CM | POA: Diagnosis not present

## 2015-08-05 DIAGNOSIS — D631 Anemia in chronic kidney disease: Secondary | ICD-10-CM | POA: Diagnosis not present

## 2015-08-05 DIAGNOSIS — D472 Monoclonal gammopathy: Secondary | ICD-10-CM | POA: Diagnosis not present

## 2015-08-05 DIAGNOSIS — R112 Nausea with vomiting, unspecified: Secondary | ICD-10-CM | POA: Diagnosis not present

## 2015-08-06 DIAGNOSIS — F411 Generalized anxiety disorder: Secondary | ICD-10-CM | POA: Diagnosis not present

## 2015-08-06 DIAGNOSIS — F331 Major depressive disorder, recurrent, moderate: Secondary | ICD-10-CM | POA: Diagnosis not present

## 2015-08-07 DIAGNOSIS — D472 Monoclonal gammopathy: Secondary | ICD-10-CM | POA: Diagnosis not present

## 2015-08-07 DIAGNOSIS — K219 Gastro-esophageal reflux disease without esophagitis: Secondary | ICD-10-CM | POA: Diagnosis not present

## 2015-08-07 DIAGNOSIS — N2581 Secondary hyperparathyroidism of renal origin: Secondary | ICD-10-CM | POA: Diagnosis not present

## 2015-08-07 DIAGNOSIS — D631 Anemia in chronic kidney disease: Secondary | ICD-10-CM | POA: Diagnosis not present

## 2015-08-07 DIAGNOSIS — N181 Chronic kidney disease, stage 1: Secondary | ICD-10-CM | POA: Diagnosis not present

## 2015-08-07 DIAGNOSIS — Z992 Dependence on renal dialysis: Secondary | ICD-10-CM | POA: Diagnosis not present

## 2015-08-07 DIAGNOSIS — R112 Nausea with vomiting, unspecified: Secondary | ICD-10-CM | POA: Diagnosis not present

## 2015-08-07 DIAGNOSIS — N186 End stage renal disease: Secondary | ICD-10-CM | POA: Diagnosis not present

## 2015-08-07 DIAGNOSIS — E611 Iron deficiency: Secondary | ICD-10-CM | POA: Diagnosis not present

## 2015-08-09 DIAGNOSIS — N186 End stage renal disease: Secondary | ICD-10-CM | POA: Diagnosis not present

## 2015-08-09 DIAGNOSIS — D631 Anemia in chronic kidney disease: Secondary | ICD-10-CM | POA: Diagnosis not present

## 2015-08-09 DIAGNOSIS — D472 Monoclonal gammopathy: Secondary | ICD-10-CM | POA: Diagnosis not present

## 2015-08-09 DIAGNOSIS — N2581 Secondary hyperparathyroidism of renal origin: Secondary | ICD-10-CM | POA: Diagnosis not present

## 2015-08-09 DIAGNOSIS — E611 Iron deficiency: Secondary | ICD-10-CM | POA: Diagnosis not present

## 2015-08-09 DIAGNOSIS — R112 Nausea with vomiting, unspecified: Secondary | ICD-10-CM | POA: Diagnosis not present

## 2015-08-12 DIAGNOSIS — D472 Monoclonal gammopathy: Secondary | ICD-10-CM | POA: Diagnosis not present

## 2015-08-12 DIAGNOSIS — N2581 Secondary hyperparathyroidism of renal origin: Secondary | ICD-10-CM | POA: Diagnosis not present

## 2015-08-12 DIAGNOSIS — D631 Anemia in chronic kidney disease: Secondary | ICD-10-CM | POA: Diagnosis not present

## 2015-08-12 DIAGNOSIS — E611 Iron deficiency: Secondary | ICD-10-CM | POA: Diagnosis not present

## 2015-08-12 DIAGNOSIS — R112 Nausea with vomiting, unspecified: Secondary | ICD-10-CM | POA: Diagnosis not present

## 2015-08-12 DIAGNOSIS — N186 End stage renal disease: Secondary | ICD-10-CM | POA: Diagnosis not present

## 2015-08-12 DIAGNOSIS — D649 Anemia, unspecified: Secondary | ICD-10-CM | POA: Diagnosis not present

## 2015-08-13 DIAGNOSIS — F411 Generalized anxiety disorder: Secondary | ICD-10-CM | POA: Diagnosis not present

## 2015-08-13 DIAGNOSIS — F331 Major depressive disorder, recurrent, moderate: Secondary | ICD-10-CM | POA: Diagnosis not present

## 2015-08-16 DIAGNOSIS — N2581 Secondary hyperparathyroidism of renal origin: Secondary | ICD-10-CM | POA: Diagnosis not present

## 2015-08-16 DIAGNOSIS — D631 Anemia in chronic kidney disease: Secondary | ICD-10-CM | POA: Diagnosis not present

## 2015-08-16 DIAGNOSIS — N186 End stage renal disease: Secondary | ICD-10-CM | POA: Diagnosis not present

## 2015-08-16 DIAGNOSIS — E611 Iron deficiency: Secondary | ICD-10-CM | POA: Diagnosis not present

## 2015-08-16 DIAGNOSIS — D472 Monoclonal gammopathy: Secondary | ICD-10-CM | POA: Diagnosis not present

## 2015-08-16 DIAGNOSIS — R112 Nausea with vomiting, unspecified: Secondary | ICD-10-CM | POA: Diagnosis not present

## 2015-08-19 DIAGNOSIS — D472 Monoclonal gammopathy: Secondary | ICD-10-CM | POA: Diagnosis not present

## 2015-08-19 DIAGNOSIS — E611 Iron deficiency: Secondary | ICD-10-CM | POA: Diagnosis not present

## 2015-08-19 DIAGNOSIS — N2581 Secondary hyperparathyroidism of renal origin: Secondary | ICD-10-CM | POA: Diagnosis not present

## 2015-08-19 DIAGNOSIS — D631 Anemia in chronic kidney disease: Secondary | ICD-10-CM | POA: Diagnosis not present

## 2015-08-19 DIAGNOSIS — R112 Nausea with vomiting, unspecified: Secondary | ICD-10-CM | POA: Diagnosis not present

## 2015-08-19 DIAGNOSIS — N186 End stage renal disease: Secondary | ICD-10-CM | POA: Diagnosis not present

## 2015-08-21 DIAGNOSIS — D472 Monoclonal gammopathy: Secondary | ICD-10-CM | POA: Diagnosis not present

## 2015-08-21 DIAGNOSIS — N186 End stage renal disease: Secondary | ICD-10-CM | POA: Diagnosis not present

## 2015-08-21 DIAGNOSIS — D631 Anemia in chronic kidney disease: Secondary | ICD-10-CM | POA: Diagnosis not present

## 2015-08-21 DIAGNOSIS — R112 Nausea with vomiting, unspecified: Secondary | ICD-10-CM | POA: Diagnosis not present

## 2015-08-21 DIAGNOSIS — N2581 Secondary hyperparathyroidism of renal origin: Secondary | ICD-10-CM | POA: Diagnosis not present

## 2015-08-21 DIAGNOSIS — E611 Iron deficiency: Secondary | ICD-10-CM | POA: Diagnosis not present

## 2015-08-23 DIAGNOSIS — N2581 Secondary hyperparathyroidism of renal origin: Secondary | ICD-10-CM | POA: Diagnosis not present

## 2015-08-23 DIAGNOSIS — N186 End stage renal disease: Secondary | ICD-10-CM | POA: Diagnosis not present

## 2015-08-23 DIAGNOSIS — D472 Monoclonal gammopathy: Secondary | ICD-10-CM | POA: Diagnosis not present

## 2015-08-23 DIAGNOSIS — D631 Anemia in chronic kidney disease: Secondary | ICD-10-CM | POA: Diagnosis not present

## 2015-08-23 DIAGNOSIS — E611 Iron deficiency: Secondary | ICD-10-CM | POA: Diagnosis not present

## 2015-08-23 DIAGNOSIS — R112 Nausea with vomiting, unspecified: Secondary | ICD-10-CM | POA: Diagnosis not present

## 2015-08-26 DIAGNOSIS — N2581 Secondary hyperparathyroidism of renal origin: Secondary | ICD-10-CM | POA: Diagnosis not present

## 2015-08-26 DIAGNOSIS — N186 End stage renal disease: Secondary | ICD-10-CM | POA: Diagnosis not present

## 2015-08-26 DIAGNOSIS — D631 Anemia in chronic kidney disease: Secondary | ICD-10-CM | POA: Diagnosis not present

## 2015-08-26 DIAGNOSIS — D472 Monoclonal gammopathy: Secondary | ICD-10-CM | POA: Diagnosis not present

## 2015-08-26 DIAGNOSIS — E611 Iron deficiency: Secondary | ICD-10-CM | POA: Diagnosis not present

## 2015-08-26 DIAGNOSIS — R112 Nausea with vomiting, unspecified: Secondary | ICD-10-CM | POA: Diagnosis not present

## 2015-08-28 DIAGNOSIS — D631 Anemia in chronic kidney disease: Secondary | ICD-10-CM | POA: Diagnosis not present

## 2015-08-28 DIAGNOSIS — N186 End stage renal disease: Secondary | ICD-10-CM | POA: Diagnosis not present

## 2015-08-28 DIAGNOSIS — D472 Monoclonal gammopathy: Secondary | ICD-10-CM | POA: Diagnosis not present

## 2015-08-28 DIAGNOSIS — N2581 Secondary hyperparathyroidism of renal origin: Secondary | ICD-10-CM | POA: Diagnosis not present

## 2015-08-28 DIAGNOSIS — R112 Nausea with vomiting, unspecified: Secondary | ICD-10-CM | POA: Diagnosis not present

## 2015-08-28 DIAGNOSIS — E611 Iron deficiency: Secondary | ICD-10-CM | POA: Diagnosis not present

## 2015-08-30 DIAGNOSIS — N2581 Secondary hyperparathyroidism of renal origin: Secondary | ICD-10-CM | POA: Diagnosis not present

## 2015-08-30 DIAGNOSIS — E611 Iron deficiency: Secondary | ICD-10-CM | POA: Diagnosis not present

## 2015-08-30 DIAGNOSIS — D631 Anemia in chronic kidney disease: Secondary | ICD-10-CM | POA: Diagnosis not present

## 2015-08-30 DIAGNOSIS — Z992 Dependence on renal dialysis: Secondary | ICD-10-CM | POA: Diagnosis not present

## 2015-08-30 DIAGNOSIS — R112 Nausea with vomiting, unspecified: Secondary | ICD-10-CM | POA: Diagnosis not present

## 2015-08-30 DIAGNOSIS — N186 End stage renal disease: Secondary | ICD-10-CM | POA: Diagnosis not present

## 2015-08-30 DIAGNOSIS — D472 Monoclonal gammopathy: Secondary | ICD-10-CM | POA: Diagnosis not present

## 2015-09-02 DIAGNOSIS — F411 Generalized anxiety disorder: Secondary | ICD-10-CM | POA: Diagnosis not present

## 2015-09-02 DIAGNOSIS — F331 Major depressive disorder, recurrent, moderate: Secondary | ICD-10-CM | POA: Diagnosis not present

## 2015-09-02 DIAGNOSIS — N186 End stage renal disease: Secondary | ICD-10-CM | POA: Diagnosis not present

## 2015-09-02 DIAGNOSIS — Z992 Dependence on renal dialysis: Secondary | ICD-10-CM | POA: Diagnosis not present

## 2015-09-02 DIAGNOSIS — D509 Iron deficiency anemia, unspecified: Secondary | ICD-10-CM | POA: Diagnosis not present

## 2015-09-02 DIAGNOSIS — N2581 Secondary hyperparathyroidism of renal origin: Secondary | ICD-10-CM | POA: Diagnosis not present

## 2015-09-02 DIAGNOSIS — D631 Anemia in chronic kidney disease: Secondary | ICD-10-CM | POA: Diagnosis not present

## 2015-09-02 DIAGNOSIS — D472 Monoclonal gammopathy: Secondary | ICD-10-CM | POA: Diagnosis not present

## 2015-09-04 DIAGNOSIS — Z992 Dependence on renal dialysis: Secondary | ICD-10-CM | POA: Diagnosis not present

## 2015-09-04 DIAGNOSIS — D631 Anemia in chronic kidney disease: Secondary | ICD-10-CM | POA: Diagnosis not present

## 2015-09-04 DIAGNOSIS — D509 Iron deficiency anemia, unspecified: Secondary | ICD-10-CM | POA: Diagnosis not present

## 2015-09-04 DIAGNOSIS — N186 End stage renal disease: Secondary | ICD-10-CM | POA: Diagnosis not present

## 2015-09-04 DIAGNOSIS — N2581 Secondary hyperparathyroidism of renal origin: Secondary | ICD-10-CM | POA: Diagnosis not present

## 2015-09-04 DIAGNOSIS — D472 Monoclonal gammopathy: Secondary | ICD-10-CM | POA: Diagnosis not present

## 2015-09-05 DIAGNOSIS — N186 End stage renal disease: Secondary | ICD-10-CM | POA: Diagnosis not present

## 2015-09-05 DIAGNOSIS — I871 Compression of vein: Secondary | ICD-10-CM | POA: Diagnosis not present

## 2015-09-05 DIAGNOSIS — Z992 Dependence on renal dialysis: Secondary | ICD-10-CM | POA: Diagnosis not present

## 2015-09-05 DIAGNOSIS — T82858D Stenosis of vascular prosthetic devices, implants and grafts, subsequent encounter: Secondary | ICD-10-CM | POA: Diagnosis not present

## 2015-09-06 DIAGNOSIS — D509 Iron deficiency anemia, unspecified: Secondary | ICD-10-CM | POA: Diagnosis not present

## 2015-09-06 DIAGNOSIS — Z992 Dependence on renal dialysis: Secondary | ICD-10-CM | POA: Diagnosis not present

## 2015-09-06 DIAGNOSIS — N2581 Secondary hyperparathyroidism of renal origin: Secondary | ICD-10-CM | POA: Diagnosis not present

## 2015-09-06 DIAGNOSIS — D472 Monoclonal gammopathy: Secondary | ICD-10-CM | POA: Diagnosis not present

## 2015-09-06 DIAGNOSIS — N186 End stage renal disease: Secondary | ICD-10-CM | POA: Diagnosis not present

## 2015-09-06 DIAGNOSIS — D631 Anemia in chronic kidney disease: Secondary | ICD-10-CM | POA: Diagnosis not present

## 2015-09-09 DIAGNOSIS — N2581 Secondary hyperparathyroidism of renal origin: Secondary | ICD-10-CM | POA: Diagnosis not present

## 2015-09-09 DIAGNOSIS — D509 Iron deficiency anemia, unspecified: Secondary | ICD-10-CM | POA: Diagnosis not present

## 2015-09-09 DIAGNOSIS — Z992 Dependence on renal dialysis: Secondary | ICD-10-CM | POA: Diagnosis not present

## 2015-09-09 DIAGNOSIS — D472 Monoclonal gammopathy: Secondary | ICD-10-CM | POA: Diagnosis not present

## 2015-09-09 DIAGNOSIS — D631 Anemia in chronic kidney disease: Secondary | ICD-10-CM | POA: Diagnosis not present

## 2015-09-09 DIAGNOSIS — N186 End stage renal disease: Secondary | ICD-10-CM | POA: Diagnosis not present

## 2015-09-11 DIAGNOSIS — N186 End stage renal disease: Secondary | ICD-10-CM | POA: Diagnosis not present

## 2015-09-11 DIAGNOSIS — N2581 Secondary hyperparathyroidism of renal origin: Secondary | ICD-10-CM | POA: Diagnosis not present

## 2015-09-11 DIAGNOSIS — Z992 Dependence on renal dialysis: Secondary | ICD-10-CM | POA: Diagnosis not present

## 2015-09-11 DIAGNOSIS — D631 Anemia in chronic kidney disease: Secondary | ICD-10-CM | POA: Diagnosis not present

## 2015-09-11 DIAGNOSIS — D509 Iron deficiency anemia, unspecified: Secondary | ICD-10-CM | POA: Diagnosis not present

## 2015-09-11 DIAGNOSIS — D472 Monoclonal gammopathy: Secondary | ICD-10-CM | POA: Diagnosis not present

## 2015-09-12 DIAGNOSIS — G894 Chronic pain syndrome: Secondary | ICD-10-CM | POA: Diagnosis not present

## 2015-09-13 DIAGNOSIS — N186 End stage renal disease: Secondary | ICD-10-CM | POA: Diagnosis not present

## 2015-09-13 DIAGNOSIS — Z992 Dependence on renal dialysis: Secondary | ICD-10-CM | POA: Diagnosis not present

## 2015-09-13 DIAGNOSIS — N2581 Secondary hyperparathyroidism of renal origin: Secondary | ICD-10-CM | POA: Diagnosis not present

## 2015-09-13 DIAGNOSIS — D472 Monoclonal gammopathy: Secondary | ICD-10-CM | POA: Diagnosis not present

## 2015-09-13 DIAGNOSIS — D509 Iron deficiency anemia, unspecified: Secondary | ICD-10-CM | POA: Diagnosis not present

## 2015-09-13 DIAGNOSIS — D631 Anemia in chronic kidney disease: Secondary | ICD-10-CM | POA: Diagnosis not present

## 2015-09-16 DIAGNOSIS — D472 Monoclonal gammopathy: Secondary | ICD-10-CM | POA: Diagnosis not present

## 2015-09-16 DIAGNOSIS — N186 End stage renal disease: Secondary | ICD-10-CM | POA: Diagnosis not present

## 2015-09-16 DIAGNOSIS — D631 Anemia in chronic kidney disease: Secondary | ICD-10-CM | POA: Diagnosis not present

## 2015-09-16 DIAGNOSIS — N2581 Secondary hyperparathyroidism of renal origin: Secondary | ICD-10-CM | POA: Diagnosis not present

## 2015-09-16 DIAGNOSIS — Z992 Dependence on renal dialysis: Secondary | ICD-10-CM | POA: Diagnosis not present

## 2015-09-16 DIAGNOSIS — D509 Iron deficiency anemia, unspecified: Secondary | ICD-10-CM | POA: Diagnosis not present

## 2015-09-17 ENCOUNTER — Encounter: Payer: Medicare Other | Admitting: Adult Health

## 2015-09-17 ENCOUNTER — Encounter: Payer: Self-pay | Admitting: Adult Health

## 2015-09-17 NOTE — Progress Notes (Signed)
Cardiology Office Note   Date:  09/17/2015   ID:  [name], DOB [birth year], MRN [id number]  PCP:  Cleda Mccreedy, MD  Cardiologist: Ross/ Jory Sims, NP   ERROR NO SHOW

## 2015-09-18 DIAGNOSIS — Z992 Dependence on renal dialysis: Secondary | ICD-10-CM | POA: Diagnosis not present

## 2015-09-18 DIAGNOSIS — N186 End stage renal disease: Secondary | ICD-10-CM | POA: Diagnosis not present

## 2015-09-18 DIAGNOSIS — D631 Anemia in chronic kidney disease: Secondary | ICD-10-CM | POA: Diagnosis not present

## 2015-09-18 DIAGNOSIS — D509 Iron deficiency anemia, unspecified: Secondary | ICD-10-CM | POA: Diagnosis not present

## 2015-09-18 DIAGNOSIS — D472 Monoclonal gammopathy: Secondary | ICD-10-CM | POA: Diagnosis not present

## 2015-09-18 DIAGNOSIS — N2581 Secondary hyperparathyroidism of renal origin: Secondary | ICD-10-CM | POA: Diagnosis not present

## 2015-09-20 DIAGNOSIS — D509 Iron deficiency anemia, unspecified: Secondary | ICD-10-CM | POA: Diagnosis not present

## 2015-09-20 DIAGNOSIS — N2581 Secondary hyperparathyroidism of renal origin: Secondary | ICD-10-CM | POA: Diagnosis not present

## 2015-09-20 DIAGNOSIS — D472 Monoclonal gammopathy: Secondary | ICD-10-CM | POA: Diagnosis not present

## 2015-09-20 DIAGNOSIS — Z992 Dependence on renal dialysis: Secondary | ICD-10-CM | POA: Diagnosis not present

## 2015-09-20 DIAGNOSIS — N186 End stage renal disease: Secondary | ICD-10-CM | POA: Diagnosis not present

## 2015-09-20 DIAGNOSIS — D631 Anemia in chronic kidney disease: Secondary | ICD-10-CM | POA: Diagnosis not present

## 2015-09-23 DIAGNOSIS — D509 Iron deficiency anemia, unspecified: Secondary | ICD-10-CM | POA: Diagnosis not present

## 2015-09-23 DIAGNOSIS — Z992 Dependence on renal dialysis: Secondary | ICD-10-CM | POA: Diagnosis not present

## 2015-09-23 DIAGNOSIS — D631 Anemia in chronic kidney disease: Secondary | ICD-10-CM | POA: Diagnosis not present

## 2015-09-23 DIAGNOSIS — N2581 Secondary hyperparathyroidism of renal origin: Secondary | ICD-10-CM | POA: Diagnosis not present

## 2015-09-23 DIAGNOSIS — N186 End stage renal disease: Secondary | ICD-10-CM | POA: Diagnosis not present

## 2015-09-23 DIAGNOSIS — D472 Monoclonal gammopathy: Secondary | ICD-10-CM | POA: Diagnosis not present

## 2015-09-23 DIAGNOSIS — D649 Anemia, unspecified: Secondary | ICD-10-CM | POA: Diagnosis not present

## 2015-09-25 DIAGNOSIS — D631 Anemia in chronic kidney disease: Secondary | ICD-10-CM | POA: Diagnosis not present

## 2015-09-25 DIAGNOSIS — Z992 Dependence on renal dialysis: Secondary | ICD-10-CM | POA: Diagnosis not present

## 2015-09-25 DIAGNOSIS — N2581 Secondary hyperparathyroidism of renal origin: Secondary | ICD-10-CM | POA: Diagnosis not present

## 2015-09-25 DIAGNOSIS — D472 Monoclonal gammopathy: Secondary | ICD-10-CM | POA: Diagnosis not present

## 2015-09-25 DIAGNOSIS — N186 End stage renal disease: Secondary | ICD-10-CM | POA: Diagnosis not present

## 2015-09-25 DIAGNOSIS — D509 Iron deficiency anemia, unspecified: Secondary | ICD-10-CM | POA: Diagnosis not present

## 2015-09-27 DIAGNOSIS — Z992 Dependence on renal dialysis: Secondary | ICD-10-CM | POA: Diagnosis not present

## 2015-09-27 DIAGNOSIS — N186 End stage renal disease: Secondary | ICD-10-CM | POA: Diagnosis not present

## 2015-09-27 DIAGNOSIS — D509 Iron deficiency anemia, unspecified: Secondary | ICD-10-CM | POA: Diagnosis not present

## 2015-09-27 DIAGNOSIS — D631 Anemia in chronic kidney disease: Secondary | ICD-10-CM | POA: Diagnosis not present

## 2015-09-27 DIAGNOSIS — D472 Monoclonal gammopathy: Secondary | ICD-10-CM | POA: Diagnosis not present

## 2015-09-27 DIAGNOSIS — N2581 Secondary hyperparathyroidism of renal origin: Secondary | ICD-10-CM | POA: Diagnosis not present

## 2015-09-30 DIAGNOSIS — Z992 Dependence on renal dialysis: Secondary | ICD-10-CM | POA: Diagnosis not present

## 2015-09-30 DIAGNOSIS — D509 Iron deficiency anemia, unspecified: Secondary | ICD-10-CM | POA: Diagnosis not present

## 2015-09-30 DIAGNOSIS — N186 End stage renal disease: Secondary | ICD-10-CM | POA: Diagnosis not present

## 2015-09-30 DIAGNOSIS — D472 Monoclonal gammopathy: Secondary | ICD-10-CM | POA: Diagnosis not present

## 2015-09-30 DIAGNOSIS — N2581 Secondary hyperparathyroidism of renal origin: Secondary | ICD-10-CM | POA: Diagnosis not present

## 2015-09-30 DIAGNOSIS — D631 Anemia in chronic kidney disease: Secondary | ICD-10-CM | POA: Diagnosis not present

## 2015-10-02 DIAGNOSIS — Z992 Dependence on renal dialysis: Secondary | ICD-10-CM | POA: Diagnosis not present

## 2015-10-02 DIAGNOSIS — N186 End stage renal disease: Secondary | ICD-10-CM | POA: Diagnosis not present

## 2015-10-02 DIAGNOSIS — E611 Iron deficiency: Secondary | ICD-10-CM | POA: Diagnosis not present

## 2015-10-02 DIAGNOSIS — N2581 Secondary hyperparathyroidism of renal origin: Secondary | ICD-10-CM | POA: Diagnosis not present

## 2015-10-02 DIAGNOSIS — D472 Monoclonal gammopathy: Secondary | ICD-10-CM | POA: Diagnosis not present

## 2015-10-02 DIAGNOSIS — D631 Anemia in chronic kidney disease: Secondary | ICD-10-CM | POA: Diagnosis not present

## 2015-10-04 DIAGNOSIS — D472 Monoclonal gammopathy: Secondary | ICD-10-CM | POA: Diagnosis not present

## 2015-10-04 DIAGNOSIS — E611 Iron deficiency: Secondary | ICD-10-CM | POA: Diagnosis not present

## 2015-10-04 DIAGNOSIS — N2581 Secondary hyperparathyroidism of renal origin: Secondary | ICD-10-CM | POA: Diagnosis not present

## 2015-10-04 DIAGNOSIS — N186 End stage renal disease: Secondary | ICD-10-CM | POA: Diagnosis not present

## 2015-10-04 DIAGNOSIS — D631 Anemia in chronic kidney disease: Secondary | ICD-10-CM | POA: Diagnosis not present

## 2015-10-04 DIAGNOSIS — Z992 Dependence on renal dialysis: Secondary | ICD-10-CM | POA: Diagnosis not present

## 2015-10-07 DIAGNOSIS — N186 End stage renal disease: Secondary | ICD-10-CM | POA: Diagnosis not present

## 2015-10-07 DIAGNOSIS — D631 Anemia in chronic kidney disease: Secondary | ICD-10-CM | POA: Diagnosis not present

## 2015-10-07 DIAGNOSIS — N2581 Secondary hyperparathyroidism of renal origin: Secondary | ICD-10-CM | POA: Diagnosis not present

## 2015-10-07 DIAGNOSIS — E611 Iron deficiency: Secondary | ICD-10-CM | POA: Diagnosis not present

## 2015-10-07 DIAGNOSIS — D472 Monoclonal gammopathy: Secondary | ICD-10-CM | POA: Diagnosis not present

## 2015-10-07 DIAGNOSIS — Z992 Dependence on renal dialysis: Secondary | ICD-10-CM | POA: Diagnosis not present

## 2015-10-09 DIAGNOSIS — N2581 Secondary hyperparathyroidism of renal origin: Secondary | ICD-10-CM | POA: Diagnosis not present

## 2015-10-09 DIAGNOSIS — Z992 Dependence on renal dialysis: Secondary | ICD-10-CM | POA: Diagnosis not present

## 2015-10-09 DIAGNOSIS — D631 Anemia in chronic kidney disease: Secondary | ICD-10-CM | POA: Diagnosis not present

## 2015-10-09 DIAGNOSIS — D472 Monoclonal gammopathy: Secondary | ICD-10-CM | POA: Diagnosis not present

## 2015-10-09 DIAGNOSIS — E611 Iron deficiency: Secondary | ICD-10-CM | POA: Diagnosis not present

## 2015-10-09 DIAGNOSIS — N186 End stage renal disease: Secondary | ICD-10-CM | POA: Diagnosis not present

## 2015-10-10 DIAGNOSIS — D649 Anemia, unspecified: Secondary | ICD-10-CM | POA: Diagnosis not present

## 2015-10-11 DIAGNOSIS — D631 Anemia in chronic kidney disease: Secondary | ICD-10-CM | POA: Diagnosis not present

## 2015-10-11 DIAGNOSIS — D472 Monoclonal gammopathy: Secondary | ICD-10-CM | POA: Diagnosis not present

## 2015-10-11 DIAGNOSIS — N2581 Secondary hyperparathyroidism of renal origin: Secondary | ICD-10-CM | POA: Diagnosis not present

## 2015-10-11 DIAGNOSIS — N186 End stage renal disease: Secondary | ICD-10-CM | POA: Diagnosis not present

## 2015-10-11 DIAGNOSIS — Z992 Dependence on renal dialysis: Secondary | ICD-10-CM | POA: Diagnosis not present

## 2015-10-11 DIAGNOSIS — E611 Iron deficiency: Secondary | ICD-10-CM | POA: Diagnosis not present

## 2015-10-14 DIAGNOSIS — Z992 Dependence on renal dialysis: Secondary | ICD-10-CM | POA: Diagnosis not present

## 2015-10-14 DIAGNOSIS — D472 Monoclonal gammopathy: Secondary | ICD-10-CM | POA: Diagnosis not present

## 2015-10-14 DIAGNOSIS — N186 End stage renal disease: Secondary | ICD-10-CM | POA: Diagnosis not present

## 2015-10-14 DIAGNOSIS — D631 Anemia in chronic kidney disease: Secondary | ICD-10-CM | POA: Diagnosis not present

## 2015-10-14 DIAGNOSIS — E611 Iron deficiency: Secondary | ICD-10-CM | POA: Diagnosis not present

## 2015-10-14 DIAGNOSIS — N2581 Secondary hyperparathyroidism of renal origin: Secondary | ICD-10-CM | POA: Diagnosis not present

## 2015-10-16 DIAGNOSIS — D472 Monoclonal gammopathy: Secondary | ICD-10-CM | POA: Diagnosis not present

## 2015-10-16 DIAGNOSIS — N2581 Secondary hyperparathyroidism of renal origin: Secondary | ICD-10-CM | POA: Diagnosis not present

## 2015-10-16 DIAGNOSIS — E611 Iron deficiency: Secondary | ICD-10-CM | POA: Diagnosis not present

## 2015-10-16 DIAGNOSIS — N186 End stage renal disease: Secondary | ICD-10-CM | POA: Diagnosis not present

## 2015-10-16 DIAGNOSIS — Z992 Dependence on renal dialysis: Secondary | ICD-10-CM | POA: Diagnosis not present

## 2015-10-16 DIAGNOSIS — D631 Anemia in chronic kidney disease: Secondary | ICD-10-CM | POA: Diagnosis not present

## 2015-10-17 DIAGNOSIS — G894 Chronic pain syndrome: Secondary | ICD-10-CM | POA: Diagnosis not present

## 2015-10-18 DIAGNOSIS — D472 Monoclonal gammopathy: Secondary | ICD-10-CM | POA: Diagnosis not present

## 2015-10-18 DIAGNOSIS — K219 Gastro-esophageal reflux disease without esophagitis: Secondary | ICD-10-CM | POA: Diagnosis not present

## 2015-10-18 DIAGNOSIS — R531 Weakness: Secondary | ICD-10-CM | POA: Diagnosis not present

## 2015-10-18 DIAGNOSIS — E611 Iron deficiency: Secondary | ICD-10-CM | POA: Diagnosis not present

## 2015-10-18 DIAGNOSIS — E875 Hyperkalemia: Secondary | ICD-10-CM | POA: Diagnosis not present

## 2015-10-18 DIAGNOSIS — D631 Anemia in chronic kidney disease: Secondary | ICD-10-CM | POA: Diagnosis not present

## 2015-10-18 DIAGNOSIS — N186 End stage renal disease: Secondary | ICD-10-CM | POA: Diagnosis not present

## 2015-10-18 DIAGNOSIS — N182 Chronic kidney disease, stage 2 (mild): Secondary | ICD-10-CM | POA: Diagnosis not present

## 2015-10-18 DIAGNOSIS — Z992 Dependence on renal dialysis: Secondary | ICD-10-CM | POA: Diagnosis not present

## 2015-10-18 DIAGNOSIS — N2581 Secondary hyperparathyroidism of renal origin: Secondary | ICD-10-CM | POA: Diagnosis not present

## 2015-10-21 DIAGNOSIS — Z992 Dependence on renal dialysis: Secondary | ICD-10-CM | POA: Diagnosis not present

## 2015-10-21 DIAGNOSIS — D631 Anemia in chronic kidney disease: Secondary | ICD-10-CM | POA: Diagnosis not present

## 2015-10-21 DIAGNOSIS — E611 Iron deficiency: Secondary | ICD-10-CM | POA: Diagnosis not present

## 2015-10-21 DIAGNOSIS — N186 End stage renal disease: Secondary | ICD-10-CM | POA: Diagnosis not present

## 2015-10-21 DIAGNOSIS — N2581 Secondary hyperparathyroidism of renal origin: Secondary | ICD-10-CM | POA: Diagnosis not present

## 2015-10-21 DIAGNOSIS — D472 Monoclonal gammopathy: Secondary | ICD-10-CM | POA: Diagnosis not present

## 2015-10-23 DIAGNOSIS — E611 Iron deficiency: Secondary | ICD-10-CM | POA: Diagnosis not present

## 2015-10-23 DIAGNOSIS — N2581 Secondary hyperparathyroidism of renal origin: Secondary | ICD-10-CM | POA: Diagnosis not present

## 2015-10-23 DIAGNOSIS — D631 Anemia in chronic kidney disease: Secondary | ICD-10-CM | POA: Diagnosis not present

## 2015-10-23 DIAGNOSIS — N186 End stage renal disease: Secondary | ICD-10-CM | POA: Diagnosis not present

## 2015-10-23 DIAGNOSIS — Z992 Dependence on renal dialysis: Secondary | ICD-10-CM | POA: Diagnosis not present

## 2015-10-23 DIAGNOSIS — D472 Monoclonal gammopathy: Secondary | ICD-10-CM | POA: Diagnosis not present

## 2015-10-25 DIAGNOSIS — N2581 Secondary hyperparathyroidism of renal origin: Secondary | ICD-10-CM | POA: Diagnosis not present

## 2015-10-25 DIAGNOSIS — N186 End stage renal disease: Secondary | ICD-10-CM | POA: Diagnosis not present

## 2015-10-25 DIAGNOSIS — E611 Iron deficiency: Secondary | ICD-10-CM | POA: Diagnosis not present

## 2015-10-25 DIAGNOSIS — D631 Anemia in chronic kidney disease: Secondary | ICD-10-CM | POA: Diagnosis not present

## 2015-10-25 DIAGNOSIS — D472 Monoclonal gammopathy: Secondary | ICD-10-CM | POA: Diagnosis not present

## 2015-10-25 DIAGNOSIS — Z992 Dependence on renal dialysis: Secondary | ICD-10-CM | POA: Diagnosis not present

## 2015-10-28 DIAGNOSIS — E611 Iron deficiency: Secondary | ICD-10-CM | POA: Diagnosis not present

## 2015-10-28 DIAGNOSIS — D631 Anemia in chronic kidney disease: Secondary | ICD-10-CM | POA: Diagnosis not present

## 2015-10-28 DIAGNOSIS — D472 Monoclonal gammopathy: Secondary | ICD-10-CM | POA: Diagnosis not present

## 2015-10-28 DIAGNOSIS — N186 End stage renal disease: Secondary | ICD-10-CM | POA: Diagnosis not present

## 2015-10-28 DIAGNOSIS — Z992 Dependence on renal dialysis: Secondary | ICD-10-CM | POA: Diagnosis not present

## 2015-10-28 DIAGNOSIS — N2581 Secondary hyperparathyroidism of renal origin: Secondary | ICD-10-CM | POA: Diagnosis not present

## 2015-10-30 DIAGNOSIS — N186 End stage renal disease: Secondary | ICD-10-CM | POA: Diagnosis not present

## 2015-10-30 DIAGNOSIS — E611 Iron deficiency: Secondary | ICD-10-CM | POA: Diagnosis not present

## 2015-10-30 DIAGNOSIS — D472 Monoclonal gammopathy: Secondary | ICD-10-CM | POA: Diagnosis not present

## 2015-10-30 DIAGNOSIS — N2581 Secondary hyperparathyroidism of renal origin: Secondary | ICD-10-CM | POA: Diagnosis not present

## 2015-10-30 DIAGNOSIS — Z992 Dependence on renal dialysis: Secondary | ICD-10-CM | POA: Diagnosis not present

## 2015-10-30 DIAGNOSIS — D631 Anemia in chronic kidney disease: Secondary | ICD-10-CM | POA: Diagnosis not present

## 2015-10-31 DIAGNOSIS — N186 End stage renal disease: Secondary | ICD-10-CM | POA: Diagnosis not present

## 2015-10-31 DIAGNOSIS — Z992 Dependence on renal dialysis: Secondary | ICD-10-CM | POA: Diagnosis not present

## 2015-11-01 DIAGNOSIS — N186 End stage renal disease: Secondary | ICD-10-CM | POA: Diagnosis not present

## 2015-11-01 DIAGNOSIS — Z992 Dependence on renal dialysis: Secondary | ICD-10-CM | POA: Diagnosis not present

## 2015-11-01 DIAGNOSIS — D472 Monoclonal gammopathy: Secondary | ICD-10-CM | POA: Diagnosis not present

## 2015-11-01 DIAGNOSIS — N2581 Secondary hyperparathyroidism of renal origin: Secondary | ICD-10-CM | POA: Diagnosis not present

## 2015-11-01 DIAGNOSIS — D631 Anemia in chronic kidney disease: Secondary | ICD-10-CM | POA: Diagnosis not present

## 2015-11-01 DIAGNOSIS — E611 Iron deficiency: Secondary | ICD-10-CM | POA: Diagnosis not present

## 2015-11-06 DIAGNOSIS — D472 Monoclonal gammopathy: Secondary | ICD-10-CM | POA: Diagnosis not present

## 2015-11-06 DIAGNOSIS — E611 Iron deficiency: Secondary | ICD-10-CM | POA: Diagnosis not present

## 2015-11-06 DIAGNOSIS — N2581 Secondary hyperparathyroidism of renal origin: Secondary | ICD-10-CM | POA: Diagnosis not present

## 2015-11-06 DIAGNOSIS — D631 Anemia in chronic kidney disease: Secondary | ICD-10-CM | POA: Diagnosis not present

## 2015-11-06 DIAGNOSIS — N186 End stage renal disease: Secondary | ICD-10-CM | POA: Diagnosis not present

## 2015-11-06 DIAGNOSIS — Z992 Dependence on renal dialysis: Secondary | ICD-10-CM | POA: Diagnosis not present

## 2015-11-08 DIAGNOSIS — Z992 Dependence on renal dialysis: Secondary | ICD-10-CM | POA: Diagnosis not present

## 2015-11-08 DIAGNOSIS — D631 Anemia in chronic kidney disease: Secondary | ICD-10-CM | POA: Diagnosis not present

## 2015-11-08 DIAGNOSIS — N186 End stage renal disease: Secondary | ICD-10-CM | POA: Diagnosis not present

## 2015-11-08 DIAGNOSIS — D472 Monoclonal gammopathy: Secondary | ICD-10-CM | POA: Diagnosis not present

## 2015-11-08 DIAGNOSIS — N2581 Secondary hyperparathyroidism of renal origin: Secondary | ICD-10-CM | POA: Diagnosis not present

## 2015-11-08 DIAGNOSIS — E611 Iron deficiency: Secondary | ICD-10-CM | POA: Diagnosis not present

## 2015-11-11 DIAGNOSIS — N186 End stage renal disease: Secondary | ICD-10-CM | POA: Diagnosis not present

## 2015-11-11 DIAGNOSIS — E611 Iron deficiency: Secondary | ICD-10-CM | POA: Diagnosis not present

## 2015-11-11 DIAGNOSIS — D631 Anemia in chronic kidney disease: Secondary | ICD-10-CM | POA: Diagnosis not present

## 2015-11-11 DIAGNOSIS — Z992 Dependence on renal dialysis: Secondary | ICD-10-CM | POA: Diagnosis not present

## 2015-11-11 DIAGNOSIS — D472 Monoclonal gammopathy: Secondary | ICD-10-CM | POA: Diagnosis not present

## 2015-11-11 DIAGNOSIS — N2581 Secondary hyperparathyroidism of renal origin: Secondary | ICD-10-CM | POA: Diagnosis not present

## 2015-11-12 DIAGNOSIS — I10 Essential (primary) hypertension: Secondary | ICD-10-CM | POA: Diagnosis not present

## 2015-11-12 DIAGNOSIS — E875 Hyperkalemia: Secondary | ICD-10-CM | POA: Diagnosis not present

## 2015-11-12 DIAGNOSIS — R531 Weakness: Secondary | ICD-10-CM | POA: Diagnosis not present

## 2015-11-12 DIAGNOSIS — K219 Gastro-esophageal reflux disease without esophagitis: Secondary | ICD-10-CM | POA: Diagnosis not present

## 2015-11-13 DIAGNOSIS — N2581 Secondary hyperparathyroidism of renal origin: Secondary | ICD-10-CM | POA: Diagnosis not present

## 2015-11-13 DIAGNOSIS — D631 Anemia in chronic kidney disease: Secondary | ICD-10-CM | POA: Diagnosis not present

## 2015-11-13 DIAGNOSIS — N186 End stage renal disease: Secondary | ICD-10-CM | POA: Diagnosis not present

## 2015-11-13 DIAGNOSIS — Z992 Dependence on renal dialysis: Secondary | ICD-10-CM | POA: Diagnosis not present

## 2015-11-13 DIAGNOSIS — E611 Iron deficiency: Secondary | ICD-10-CM | POA: Diagnosis not present

## 2015-11-13 DIAGNOSIS — D472 Monoclonal gammopathy: Secondary | ICD-10-CM | POA: Diagnosis not present

## 2015-11-14 DIAGNOSIS — G894 Chronic pain syndrome: Secondary | ICD-10-CM | POA: Diagnosis not present

## 2015-11-15 ENCOUNTER — Ambulatory Visit (INDEPENDENT_AMBULATORY_CARE_PROVIDER_SITE_OTHER): Payer: Medicare Other | Admitting: Internal Medicine

## 2015-11-15 ENCOUNTER — Encounter: Payer: Self-pay | Admitting: Internal Medicine

## 2015-11-15 VITALS — BP 174/96 | HR 60 | Ht 62.0 in | Wt 108.0 lb

## 2015-11-15 DIAGNOSIS — E611 Iron deficiency: Secondary | ICD-10-CM | POA: Diagnosis not present

## 2015-11-15 DIAGNOSIS — I4892 Unspecified atrial flutter: Secondary | ICD-10-CM

## 2015-11-15 DIAGNOSIS — I10 Essential (primary) hypertension: Secondary | ICD-10-CM

## 2015-11-15 DIAGNOSIS — Z992 Dependence on renal dialysis: Secondary | ICD-10-CM | POA: Diagnosis not present

## 2015-11-15 DIAGNOSIS — D472 Monoclonal gammopathy: Secondary | ICD-10-CM | POA: Diagnosis not present

## 2015-11-15 DIAGNOSIS — N186 End stage renal disease: Secondary | ICD-10-CM | POA: Diagnosis not present

## 2015-11-15 DIAGNOSIS — N2581 Secondary hyperparathyroidism of renal origin: Secondary | ICD-10-CM | POA: Diagnosis not present

## 2015-11-15 DIAGNOSIS — D631 Anemia in chronic kidney disease: Secondary | ICD-10-CM | POA: Diagnosis not present

## 2015-11-15 NOTE — Patient Instructions (Signed)
Your physician wants you to follow-up in: 1 Year with Dr. Harrington Challenger. You will receive a reminder letter in the mail two months in advance. If you don't receive a letter, please call our office to schedule the follow-up appointment.  Your physician recommends that you continue on your current medications as directed. Please refer to the Current Medication list given to you today.  If you need a refill on your cardiac medications before your next appointment, please call your pharmacy.  Thank you for choosing [facility]!

## 2015-11-15 NOTE — Progress Notes (Signed)
Cardiology Office Note   Date:  11/15/2015   ID:  [name], DOB 1951-02-03, MRN [id number]  PCP:  Cleda Mccreedy, MD  Cardiologist:   Dorris Carnes, MD    Pt presents for f/u of HTN and atrial flutter     History of Present Illness: [name] is a 65 y.o. female with a history of arial flutter  I saw her in the hospital in Dec 2016  Hx of anemia ESRD, THN, HIV and CHF   At that admit pt developed afib after admit  CHADSVASc of 2     Pt had been seen at [facility] in 2016  Cath showed normal coronary arteries   Seen by Boris Lown in Jan   Hx of HTN, bradycardia,  Since seen denies CP  No palpitations   Breathing is OK    Outpatient Medications Prior to Visit  Medication Sig Dispense Refill  . buPROPion (WELLBUTRIN XL) 150 MG 24 hr tablet Take 150 mg by mouth daily.    Marland Kitchen diltiazem (CARDIZEM) 60 MG tablet Take 60 mg by mouth every 8 (eight) hours.    Marland Kitchen epoetin alfa (EPOGEN,PROCRIT) 99371 UNIT/ML injection Inject 8,000 Units into the skin 3 (three) times a week. To be given at dialysis, Monday Wednesday and Friday    . fentaNYL (DURAGESIC - DOSED MCG/HR) 50 MCG/HR Apply one patch every 72 hours for pain. Remove old patch. External use only. Rotate sites. 10 patch 0  . hydroxypropyl methylcellulose (ISOPTO TEARS) 2.5 % ophthalmic solution Place 1 drop into both eyes 2 (two) times daily.      Marland Kitchen lamiVUDine (EPIVIR) 10 MG/ML solution Take 2.5 mLs (25 mg total) by mouth daily. 240 mL 0  . lidocaine-prilocaine (EMLA) cream Apply 1 application topically 3 (three) times a week. Apply 1 hour prior to dialysis access site on Monday Wednesday and Friday    . LORazepam (ATIVAN) 0.5 MG tablet Take 0.5 tablets (0.25 mg total) by mouth every 6 (six) hours as needed for anxiety. 60 tablet 5  . metoprolol (LOPRESSOR) 50 MG tablet Take 50 mg by mouth 2 (two) times daily.    . multivitamin (RENA-VIT) TABS tablet Take 1 tablet by mouth at bedtime.    Marland Kitchen omeprazole (PRILOSEC) 40 MG capsule Take 40  mg by mouth daily.     . ondansetron (ZOFRAN) 4 MG tablet Take 2 mg by mouth 3 (three) times daily. nausea    . oxyCODONE (OXY IR/ROXICODONE) 5 MG immediate release tablet Take one tablet by mouth every 6 hours as needed for moderate pain 120 tablet 0  . Oxycodone HCl 10 MG TABS Take 10 mg by mouth at bedtime.    . raltegravir (ISENTRESS) 400 MG tablet Take 400 mg by mouth 2 (two) times daily.     . sertraline (ZOLOFT) 100 MG tablet Take 100 mg by mouth daily.    . sevelamer carbonate (RENVELA) 800 MG tablet Take 1,600 mg by mouth 3 (three) times daily with meals.     . sildenafil (REVATIO) 20 MG tablet Take 20 mg by mouth 3 (three) times daily.     Marland Kitchen sulfamethoxazole-trimethoprim (BACTRIM,SEPTRA) 400-80 MG per tablet Take 1 tablet by mouth 3 (three) times a week. 30 tablet 0  . vitamin C (ASCORBIC ACID) 500 MG tablet Take 500 mg by mouth 2 (two) times daily.    . zaleplon (SONATA) 5 MG capsule Take one capsule by mouth every night at bedtime for rest 30 capsule 0  .  zidovudine (RETROVIR) 100 MG capsule Take 100 mg by mouth 3 (three) times daily.       No facility-administered medications prior to visit.      Allergies:   Latex and Penicillins   Past Medical History:  Diagnosis Date  . Anemia   . Anxiety   . Atrial flutter ([place]) 02/22/2015  . C. difficile colitis 10/10/11  . Chronic diarrhea   . Depression   . ESRD on hemodialysis ([place])   . HIV (human immunodeficiency virus infection) ([place])   . Hypertension   . Insomnia   . Intracranial hemorrhage ([place])   . Muscle weakness (generalized)   . Pulmonary HTN ([place])   . Traumatic hematoma of right upper arm 02/22/2015    Past Surgical History:  Procedure Laterality Date  . BIOPSY THYROID    . Dialysis Shunts     previous one removed from left arm and now present one in right arm  . ESOPHAGOGASTRODUODENOSCOPY  12/15/2010   Procedure: ESOPHAGOGASTRODUODENOSCOPY (EGD);  Surgeon: Daneil Dolin, MD;  Location: [facility] ENDO [facility];  Service:  Endoscopy;  Laterality: N/A;  . EXCISION OF BREAST BIOPSY Right 05/04/2012   Procedure: EXCISION OF BREAST BIOPSY;  Surgeon: Donato Heinz, MD;  Location: [facility] ORS;  Service: General;  Laterality: Right;  Right Excisional Breast Biopsy  . FISTULOGRAM Right 03/26/2014   Procedure: Right Arm Fistulogram with Venoplasty Right Subclavian Vein and Inominate Vein. Debridement Fistula Ulcer;  Surgeon: Conrad Lane, MD;  Location: [facility];  Service: Vascular;  Laterality: Right;  . SHUNTOGRAM Right 10/19/2013   Procedure: FISTULOGRAM;  Surgeon: Conrad Edgewood, MD;  [facility] C[facility] LAB;  Service: Cardiovascular;  Laterality: Right;  . TONSILLECTOMY       Social History:  The patient  reports that she quit smoking about 32 years ago. She has never used smokeless tobacco. She reports that she does not drink alcohol or use drugs.   Family History:  The patient's family history is not on file. Noncontributory    ROS:  Please see the history of present illness. All other systems are reviewed and  Negative to the above problem except as noted.    PHYSICAL EXAM: VS:  BP (!) 174/96   Pulse 60   Ht 5\' 2"  (1.575 m)   Wt 108 lb (49 kg)   SpO2 94%   BMI 19.75 kg/m   GEN: Well nourished, well developed, in no acute distress HEENT: normal Neck: no JVD, carotid bruits, or masses Cardiac: RRR; Gr II/VI systolic Mumur LSB  No rubs, or gallops,no edema  Respiratory:  clear to auscultation bilaterally, normal work of breathing GI: soft, nontender, nondistended, + BS  No hepatomegaly  MS: no deformity Moving all extremities   Skin: warm and dry, no rash Neuro:  Strength and sensation are intact Psych: euthymic mood, full affect   EKG:  EKG is not  ordered today.   Lipid Panel    Component Value Date/Time   CHOL 147 07/23/2015 1652   TRIG 136 07/23/2015 1652   HDL 54 07/23/2015 1652   CHOLHDL 2.7 07/23/2015 1652   VLDL 27 07/23/2015 1652   LDLCALC 66 07/23/2015 1652      Wt Readings from Last 3  Encounters:  11/15/15 108 lb (49 kg)  07/23/15 110 lb (49.9 kg)  03/12/15 112 lb (50.8 kg)      ASSESSMENT AND PLAN:  1  Paroxysmal atrial flutter  Only documented spell was last December  Follow  I  would not Rx with anticoag for now given other med problems     2  HTN  BP is prob artifactually high  She was sitting, taken in dependent leg  Was good on 9/13 at home  Continue meds    F/U in 1 year  Sooner if problems develop      Signed, Dorris Carnes, MD  11/15/2015 9:51 AM    [address], [place], [place]  [address] Phone: [phone]; Fax: [phone]

## 2015-11-18 DIAGNOSIS — D472 Monoclonal gammopathy: Secondary | ICD-10-CM | POA: Diagnosis not present

## 2015-11-18 DIAGNOSIS — N2581 Secondary hyperparathyroidism of renal origin: Secondary | ICD-10-CM | POA: Diagnosis not present

## 2015-11-18 DIAGNOSIS — N186 End stage renal disease: Secondary | ICD-10-CM | POA: Diagnosis not present

## 2015-11-18 DIAGNOSIS — Z992 Dependence on renal dialysis: Secondary | ICD-10-CM | POA: Diagnosis not present

## 2015-11-18 DIAGNOSIS — E611 Iron deficiency: Secondary | ICD-10-CM | POA: Diagnosis not present

## 2015-11-18 DIAGNOSIS — D631 Anemia in chronic kidney disease: Secondary | ICD-10-CM | POA: Diagnosis not present

## 2015-11-19 DIAGNOSIS — D649 Anemia, unspecified: Secondary | ICD-10-CM | POA: Diagnosis not present

## 2015-11-22 DIAGNOSIS — E611 Iron deficiency: Secondary | ICD-10-CM | POA: Diagnosis not present

## 2015-11-22 DIAGNOSIS — D472 Monoclonal gammopathy: Secondary | ICD-10-CM | POA: Diagnosis not present

## 2015-11-22 DIAGNOSIS — Z992 Dependence on renal dialysis: Secondary | ICD-10-CM | POA: Diagnosis not present

## 2015-11-22 DIAGNOSIS — N2581 Secondary hyperparathyroidism of renal origin: Secondary | ICD-10-CM | POA: Diagnosis not present

## 2015-11-22 DIAGNOSIS — N186 End stage renal disease: Secondary | ICD-10-CM | POA: Diagnosis not present

## 2015-11-22 DIAGNOSIS — D631 Anemia in chronic kidney disease: Secondary | ICD-10-CM | POA: Diagnosis not present

## 2015-11-25 DIAGNOSIS — N2581 Secondary hyperparathyroidism of renal origin: Secondary | ICD-10-CM | POA: Diagnosis not present

## 2015-11-25 DIAGNOSIS — D631 Anemia in chronic kidney disease: Secondary | ICD-10-CM | POA: Diagnosis not present

## 2015-11-25 DIAGNOSIS — N186 End stage renal disease: Secondary | ICD-10-CM | POA: Diagnosis not present

## 2015-11-25 DIAGNOSIS — E611 Iron deficiency: Secondary | ICD-10-CM | POA: Diagnosis not present

## 2015-11-25 DIAGNOSIS — Z992 Dependence on renal dialysis: Secondary | ICD-10-CM | POA: Diagnosis not present

## 2015-11-25 DIAGNOSIS — D472 Monoclonal gammopathy: Secondary | ICD-10-CM | POA: Diagnosis not present

## 2015-11-27 DIAGNOSIS — D631 Anemia in chronic kidney disease: Secondary | ICD-10-CM | POA: Diagnosis not present

## 2015-11-27 DIAGNOSIS — Z992 Dependence on renal dialysis: Secondary | ICD-10-CM | POA: Diagnosis not present

## 2015-11-27 DIAGNOSIS — E611 Iron deficiency: Secondary | ICD-10-CM | POA: Diagnosis not present

## 2015-11-27 DIAGNOSIS — N2581 Secondary hyperparathyroidism of renal origin: Secondary | ICD-10-CM | POA: Diagnosis not present

## 2015-11-27 DIAGNOSIS — N186 End stage renal disease: Secondary | ICD-10-CM | POA: Diagnosis not present

## 2015-11-27 DIAGNOSIS — D472 Monoclonal gammopathy: Secondary | ICD-10-CM | POA: Diagnosis not present

## 2015-11-29 DIAGNOSIS — N186 End stage renal disease: Secondary | ICD-10-CM | POA: Diagnosis not present

## 2015-11-29 DIAGNOSIS — D472 Monoclonal gammopathy: Secondary | ICD-10-CM | POA: Diagnosis not present

## 2015-11-29 DIAGNOSIS — N2581 Secondary hyperparathyroidism of renal origin: Secondary | ICD-10-CM | POA: Diagnosis not present

## 2015-11-29 DIAGNOSIS — Z992 Dependence on renal dialysis: Secondary | ICD-10-CM | POA: Diagnosis not present

## 2015-11-29 DIAGNOSIS — D631 Anemia in chronic kidney disease: Secondary | ICD-10-CM | POA: Diagnosis not present

## 2015-11-29 DIAGNOSIS — E611 Iron deficiency: Secondary | ICD-10-CM | POA: Diagnosis not present

## 2015-11-30 DIAGNOSIS — Z992 Dependence on renal dialysis: Secondary | ICD-10-CM | POA: Diagnosis not present

## 2015-11-30 DIAGNOSIS — N186 End stage renal disease: Secondary | ICD-10-CM | POA: Diagnosis not present

## 2015-12-02 DIAGNOSIS — N186 End stage renal disease: Secondary | ICD-10-CM | POA: Diagnosis not present

## 2015-12-02 DIAGNOSIS — Z992 Dependence on renal dialysis: Secondary | ICD-10-CM | POA: Diagnosis not present

## 2015-12-02 DIAGNOSIS — N2581 Secondary hyperparathyroidism of renal origin: Secondary | ICD-10-CM | POA: Diagnosis not present

## 2015-12-02 DIAGNOSIS — D509 Iron deficiency anemia, unspecified: Secondary | ICD-10-CM | POA: Diagnosis not present

## 2015-12-02 DIAGNOSIS — D472 Monoclonal gammopathy: Secondary | ICD-10-CM | POA: Diagnosis not present

## 2015-12-04 DIAGNOSIS — Z992 Dependence on renal dialysis: Secondary | ICD-10-CM | POA: Diagnosis not present

## 2015-12-04 DIAGNOSIS — N2581 Secondary hyperparathyroidism of renal origin: Secondary | ICD-10-CM | POA: Diagnosis not present

## 2015-12-04 DIAGNOSIS — D472 Monoclonal gammopathy: Secondary | ICD-10-CM | POA: Diagnosis not present

## 2015-12-04 DIAGNOSIS — D509 Iron deficiency anemia, unspecified: Secondary | ICD-10-CM | POA: Diagnosis not present

## 2015-12-04 DIAGNOSIS — N186 End stage renal disease: Secondary | ICD-10-CM | POA: Diagnosis not present

## 2015-12-06 DIAGNOSIS — D509 Iron deficiency anemia, unspecified: Secondary | ICD-10-CM | POA: Diagnosis not present

## 2015-12-06 DIAGNOSIS — Z992 Dependence on renal dialysis: Secondary | ICD-10-CM | POA: Diagnosis not present

## 2015-12-06 DIAGNOSIS — D472 Monoclonal gammopathy: Secondary | ICD-10-CM | POA: Diagnosis not present

## 2015-12-06 DIAGNOSIS — N2581 Secondary hyperparathyroidism of renal origin: Secondary | ICD-10-CM | POA: Diagnosis not present

## 2015-12-06 DIAGNOSIS — N186 End stage renal disease: Secondary | ICD-10-CM | POA: Diagnosis not present

## 2015-12-09 ENCOUNTER — Other Ambulatory Visit: Payer: Self-pay | Admitting: Internal Medicine

## 2015-12-09 DIAGNOSIS — Z1231 Encounter for screening mammogram for malignant neoplasm of breast: Secondary | ICD-10-CM

## 2015-12-09 DIAGNOSIS — N2581 Secondary hyperparathyroidism of renal origin: Secondary | ICD-10-CM | POA: Diagnosis not present

## 2015-12-09 DIAGNOSIS — N186 End stage renal disease: Secondary | ICD-10-CM | POA: Diagnosis not present

## 2015-12-09 DIAGNOSIS — D509 Iron deficiency anemia, unspecified: Secondary | ICD-10-CM | POA: Diagnosis not present

## 2015-12-09 DIAGNOSIS — D472 Monoclonal gammopathy: Secondary | ICD-10-CM | POA: Diagnosis not present

## 2015-12-09 DIAGNOSIS — Z992 Dependence on renal dialysis: Secondary | ICD-10-CM | POA: Diagnosis not present

## 2015-12-11 DIAGNOSIS — D509 Iron deficiency anemia, unspecified: Secondary | ICD-10-CM | POA: Diagnosis not present

## 2015-12-11 DIAGNOSIS — N2581 Secondary hyperparathyroidism of renal origin: Secondary | ICD-10-CM | POA: Diagnosis not present

## 2015-12-11 DIAGNOSIS — N186 End stage renal disease: Secondary | ICD-10-CM | POA: Diagnosis not present

## 2015-12-11 DIAGNOSIS — D472 Monoclonal gammopathy: Secondary | ICD-10-CM | POA: Diagnosis not present

## 2015-12-11 DIAGNOSIS — Z992 Dependence on renal dialysis: Secondary | ICD-10-CM | POA: Diagnosis not present

## 2015-12-12 DIAGNOSIS — D518 Other vitamin B12 deficiency anemias: Secondary | ICD-10-CM | POA: Diagnosis not present

## 2015-12-12 DIAGNOSIS — R531 Weakness: Secondary | ICD-10-CM | POA: Diagnosis not present

## 2015-12-12 DIAGNOSIS — N185 Chronic kidney disease, stage 5: Secondary | ICD-10-CM | POA: Diagnosis not present

## 2015-12-12 DIAGNOSIS — I10 Essential (primary) hypertension: Secondary | ICD-10-CM | POA: Diagnosis not present

## 2015-12-12 DIAGNOSIS — D649 Anemia, unspecified: Secondary | ICD-10-CM | POA: Diagnosis not present

## 2015-12-13 DIAGNOSIS — N186 End stage renal disease: Secondary | ICD-10-CM | POA: Diagnosis not present

## 2015-12-13 DIAGNOSIS — Z992 Dependence on renal dialysis: Secondary | ICD-10-CM | POA: Diagnosis not present

## 2015-12-13 DIAGNOSIS — N2581 Secondary hyperparathyroidism of renal origin: Secondary | ICD-10-CM | POA: Diagnosis not present

## 2015-12-13 DIAGNOSIS — D472 Monoclonal gammopathy: Secondary | ICD-10-CM | POA: Diagnosis not present

## 2015-12-13 DIAGNOSIS — D509 Iron deficiency anemia, unspecified: Secondary | ICD-10-CM | POA: Diagnosis not present

## 2015-12-16 DIAGNOSIS — N186 End stage renal disease: Secondary | ICD-10-CM | POA: Diagnosis not present

## 2015-12-16 DIAGNOSIS — Z992 Dependence on renal dialysis: Secondary | ICD-10-CM | POA: Diagnosis not present

## 2015-12-16 DIAGNOSIS — D509 Iron deficiency anemia, unspecified: Secondary | ICD-10-CM | POA: Diagnosis not present

## 2015-12-16 DIAGNOSIS — N2581 Secondary hyperparathyroidism of renal origin: Secondary | ICD-10-CM | POA: Diagnosis not present

## 2015-12-16 DIAGNOSIS — D472 Monoclonal gammopathy: Secondary | ICD-10-CM | POA: Diagnosis not present

## 2015-12-18 DIAGNOSIS — N186 End stage renal disease: Secondary | ICD-10-CM | POA: Diagnosis not present

## 2015-12-18 DIAGNOSIS — D509 Iron deficiency anemia, unspecified: Secondary | ICD-10-CM | POA: Diagnosis not present

## 2015-12-18 DIAGNOSIS — N2581 Secondary hyperparathyroidism of renal origin: Secondary | ICD-10-CM | POA: Diagnosis not present

## 2015-12-18 DIAGNOSIS — Z992 Dependence on renal dialysis: Secondary | ICD-10-CM | POA: Diagnosis not present

## 2015-12-18 DIAGNOSIS — D472 Monoclonal gammopathy: Secondary | ICD-10-CM | POA: Diagnosis not present

## 2015-12-19 ENCOUNTER — Ambulatory Visit
Admission: RE | Admit: 2015-12-19 | Discharge: 2015-12-19 | Disposition: A | Discharge status: Home / Self Care | Payer: Medicare Other | Source: Ambulatory Visit | Attending: Internal Medicine | Admitting: Internal Medicine

## 2015-12-19 DIAGNOSIS — G894 Chronic pain syndrome: Secondary | ICD-10-CM | POA: Diagnosis not present

## 2015-12-19 DIAGNOSIS — Z1231 Encounter for screening mammogram for malignant neoplasm of breast: Secondary | ICD-10-CM | POA: Diagnosis not present

## 2015-12-19 DIAGNOSIS — Z23 Encounter for immunization: Secondary | ICD-10-CM | POA: Diagnosis not present

## 2015-12-20 DIAGNOSIS — N186 End stage renal disease: Secondary | ICD-10-CM | POA: Diagnosis not present

## 2015-12-20 DIAGNOSIS — N2581 Secondary hyperparathyroidism of renal origin: Secondary | ICD-10-CM | POA: Diagnosis not present

## 2015-12-20 DIAGNOSIS — Z992 Dependence on renal dialysis: Secondary | ICD-10-CM | POA: Diagnosis not present

## 2015-12-20 DIAGNOSIS — D509 Iron deficiency anemia, unspecified: Secondary | ICD-10-CM | POA: Diagnosis not present

## 2015-12-20 DIAGNOSIS — D472 Monoclonal gammopathy: Secondary | ICD-10-CM | POA: Diagnosis not present

## 2015-12-23 DIAGNOSIS — N2581 Secondary hyperparathyroidism of renal origin: Secondary | ICD-10-CM | POA: Diagnosis not present

## 2015-12-23 DIAGNOSIS — N186 End stage renal disease: Secondary | ICD-10-CM | POA: Diagnosis not present

## 2015-12-23 DIAGNOSIS — D472 Monoclonal gammopathy: Secondary | ICD-10-CM | POA: Diagnosis not present

## 2015-12-23 DIAGNOSIS — D509 Iron deficiency anemia, unspecified: Secondary | ICD-10-CM | POA: Diagnosis not present

## 2015-12-23 DIAGNOSIS — Z992 Dependence on renal dialysis: Secondary | ICD-10-CM | POA: Diagnosis not present

## 2015-12-25 DIAGNOSIS — N186 End stage renal disease: Secondary | ICD-10-CM | POA: Diagnosis not present

## 2015-12-25 DIAGNOSIS — Z992 Dependence on renal dialysis: Secondary | ICD-10-CM | POA: Diagnosis not present

## 2015-12-25 DIAGNOSIS — D509 Iron deficiency anemia, unspecified: Secondary | ICD-10-CM | POA: Diagnosis not present

## 2015-12-25 DIAGNOSIS — D472 Monoclonal gammopathy: Secondary | ICD-10-CM | POA: Diagnosis not present

## 2015-12-25 DIAGNOSIS — N2581 Secondary hyperparathyroidism of renal origin: Secondary | ICD-10-CM | POA: Diagnosis not present

## 2015-12-27 DIAGNOSIS — N186 End stage renal disease: Secondary | ICD-10-CM | POA: Diagnosis not present

## 2015-12-27 DIAGNOSIS — D509 Iron deficiency anemia, unspecified: Secondary | ICD-10-CM | POA: Diagnosis not present

## 2015-12-27 DIAGNOSIS — N2581 Secondary hyperparathyroidism of renal origin: Secondary | ICD-10-CM | POA: Diagnosis not present

## 2015-12-27 DIAGNOSIS — D472 Monoclonal gammopathy: Secondary | ICD-10-CM | POA: Diagnosis not present

## 2015-12-27 DIAGNOSIS — Z992 Dependence on renal dialysis: Secondary | ICD-10-CM | POA: Diagnosis not present

## 2015-12-30 DIAGNOSIS — D509 Iron deficiency anemia, unspecified: Secondary | ICD-10-CM | POA: Diagnosis not present

## 2015-12-30 DIAGNOSIS — D472 Monoclonal gammopathy: Secondary | ICD-10-CM | POA: Diagnosis not present

## 2015-12-30 DIAGNOSIS — Z992 Dependence on renal dialysis: Secondary | ICD-10-CM | POA: Diagnosis not present

## 2015-12-30 DIAGNOSIS — N2581 Secondary hyperparathyroidism of renal origin: Secondary | ICD-10-CM | POA: Diagnosis not present

## 2015-12-30 DIAGNOSIS — N186 End stage renal disease: Secondary | ICD-10-CM | POA: Diagnosis not present

## 2015-12-31 DIAGNOSIS — Z992 Dependence on renal dialysis: Secondary | ICD-10-CM | POA: Diagnosis not present

## 2015-12-31 DIAGNOSIS — N186 End stage renal disease: Secondary | ICD-10-CM | POA: Diagnosis not present

## 2016-01-03 DIAGNOSIS — R112 Nausea with vomiting, unspecified: Secondary | ICD-10-CM | POA: Diagnosis not present

## 2016-01-03 DIAGNOSIS — E611 Iron deficiency: Secondary | ICD-10-CM | POA: Diagnosis not present

## 2016-01-03 DIAGNOSIS — N186 End stage renal disease: Secondary | ICD-10-CM | POA: Diagnosis not present

## 2016-01-03 DIAGNOSIS — Z992 Dependence on renal dialysis: Secondary | ICD-10-CM | POA: Diagnosis not present

## 2016-01-03 DIAGNOSIS — N2581 Secondary hyperparathyroidism of renal origin: Secondary | ICD-10-CM | POA: Diagnosis not present

## 2016-01-03 DIAGNOSIS — D472 Monoclonal gammopathy: Secondary | ICD-10-CM | POA: Diagnosis not present

## 2016-01-06 DIAGNOSIS — R112 Nausea with vomiting, unspecified: Secondary | ICD-10-CM | POA: Diagnosis not present

## 2016-01-06 DIAGNOSIS — E611 Iron deficiency: Secondary | ICD-10-CM | POA: Diagnosis not present

## 2016-01-06 DIAGNOSIS — D472 Monoclonal gammopathy: Secondary | ICD-10-CM | POA: Diagnosis not present

## 2016-01-06 DIAGNOSIS — D649 Anemia, unspecified: Secondary | ICD-10-CM | POA: Diagnosis not present

## 2016-01-06 DIAGNOSIS — N2581 Secondary hyperparathyroidism of renal origin: Secondary | ICD-10-CM | POA: Diagnosis not present

## 2016-01-06 DIAGNOSIS — Z992 Dependence on renal dialysis: Secondary | ICD-10-CM | POA: Diagnosis not present

## 2016-01-06 DIAGNOSIS — N186 End stage renal disease: Secondary | ICD-10-CM | POA: Diagnosis not present

## 2016-01-06 DIAGNOSIS — Z79899 Other long term (current) drug therapy: Secondary | ICD-10-CM | POA: Diagnosis not present

## 2016-01-08 DIAGNOSIS — Z992 Dependence on renal dialysis: Secondary | ICD-10-CM | POA: Diagnosis not present

## 2016-01-08 DIAGNOSIS — R112 Nausea with vomiting, unspecified: Secondary | ICD-10-CM | POA: Diagnosis not present

## 2016-01-08 DIAGNOSIS — K219 Gastro-esophageal reflux disease without esophagitis: Secondary | ICD-10-CM | POA: Diagnosis not present

## 2016-01-08 DIAGNOSIS — D472 Monoclonal gammopathy: Secondary | ICD-10-CM | POA: Diagnosis not present

## 2016-01-08 DIAGNOSIS — R531 Weakness: Secondary | ICD-10-CM | POA: Diagnosis not present

## 2016-01-08 DIAGNOSIS — N2581 Secondary hyperparathyroidism of renal origin: Secondary | ICD-10-CM | POA: Diagnosis not present

## 2016-01-08 DIAGNOSIS — E611 Iron deficiency: Secondary | ICD-10-CM | POA: Diagnosis not present

## 2016-01-08 DIAGNOSIS — N186 End stage renal disease: Secondary | ICD-10-CM | POA: Diagnosis not present

## 2016-01-08 DIAGNOSIS — E875 Hyperkalemia: Secondary | ICD-10-CM | POA: Diagnosis not present

## 2016-01-08 DIAGNOSIS — I10 Essential (primary) hypertension: Secondary | ICD-10-CM | POA: Diagnosis not present

## 2016-01-10 DIAGNOSIS — E611 Iron deficiency: Secondary | ICD-10-CM | POA: Diagnosis not present

## 2016-01-10 DIAGNOSIS — R112 Nausea with vomiting, unspecified: Secondary | ICD-10-CM | POA: Diagnosis not present

## 2016-01-10 DIAGNOSIS — N186 End stage renal disease: Secondary | ICD-10-CM | POA: Diagnosis not present

## 2016-01-10 DIAGNOSIS — Z992 Dependence on renal dialysis: Secondary | ICD-10-CM | POA: Diagnosis not present

## 2016-01-10 DIAGNOSIS — N2581 Secondary hyperparathyroidism of renal origin: Secondary | ICD-10-CM | POA: Diagnosis not present

## 2016-01-10 DIAGNOSIS — D472 Monoclonal gammopathy: Secondary | ICD-10-CM | POA: Diagnosis not present

## 2016-01-13 DIAGNOSIS — E611 Iron deficiency: Secondary | ICD-10-CM | POA: Diagnosis not present

## 2016-01-13 DIAGNOSIS — Z992 Dependence on renal dialysis: Secondary | ICD-10-CM | POA: Diagnosis not present

## 2016-01-13 DIAGNOSIS — D472 Monoclonal gammopathy: Secondary | ICD-10-CM | POA: Diagnosis not present

## 2016-01-13 DIAGNOSIS — N2581 Secondary hyperparathyroidism of renal origin: Secondary | ICD-10-CM | POA: Diagnosis not present

## 2016-01-13 DIAGNOSIS — R112 Nausea with vomiting, unspecified: Secondary | ICD-10-CM | POA: Diagnosis not present

## 2016-01-13 DIAGNOSIS — N186 End stage renal disease: Secondary | ICD-10-CM | POA: Diagnosis not present

## 2016-01-15 DIAGNOSIS — E611 Iron deficiency: Secondary | ICD-10-CM | POA: Diagnosis not present

## 2016-01-15 DIAGNOSIS — Z992 Dependence on renal dialysis: Secondary | ICD-10-CM | POA: Diagnosis not present

## 2016-01-15 DIAGNOSIS — N186 End stage renal disease: Secondary | ICD-10-CM | POA: Diagnosis not present

## 2016-01-15 DIAGNOSIS — R112 Nausea with vomiting, unspecified: Secondary | ICD-10-CM | POA: Diagnosis not present

## 2016-01-15 DIAGNOSIS — D472 Monoclonal gammopathy: Secondary | ICD-10-CM | POA: Diagnosis not present

## 2016-01-15 DIAGNOSIS — N2581 Secondary hyperparathyroidism of renal origin: Secondary | ICD-10-CM | POA: Diagnosis not present

## 2016-01-17 DIAGNOSIS — N186 End stage renal disease: Secondary | ICD-10-CM | POA: Diagnosis not present

## 2016-01-17 DIAGNOSIS — D472 Monoclonal gammopathy: Secondary | ICD-10-CM | POA: Diagnosis not present

## 2016-01-17 DIAGNOSIS — E611 Iron deficiency: Secondary | ICD-10-CM | POA: Diagnosis not present

## 2016-01-17 DIAGNOSIS — N2581 Secondary hyperparathyroidism of renal origin: Secondary | ICD-10-CM | POA: Diagnosis not present

## 2016-01-17 DIAGNOSIS — R112 Nausea with vomiting, unspecified: Secondary | ICD-10-CM | POA: Diagnosis not present

## 2016-01-17 DIAGNOSIS — Z992 Dependence on renal dialysis: Secondary | ICD-10-CM | POA: Diagnosis not present

## 2016-01-18 DIAGNOSIS — G894 Chronic pain syndrome: Secondary | ICD-10-CM | POA: Diagnosis not present

## 2016-01-19 DIAGNOSIS — F331 Major depressive disorder, recurrent, moderate: Secondary | ICD-10-CM | POA: Diagnosis not present

## 2016-01-19 DIAGNOSIS — F411 Generalized anxiety disorder: Secondary | ICD-10-CM | POA: Diagnosis not present

## 2016-01-20 DIAGNOSIS — F411 Generalized anxiety disorder: Secondary | ICD-10-CM | POA: Diagnosis not present

## 2016-01-20 DIAGNOSIS — F5101 Primary insomnia: Secondary | ICD-10-CM | POA: Diagnosis not present

## 2016-01-20 DIAGNOSIS — F331 Major depressive disorder, recurrent, moderate: Secondary | ICD-10-CM | POA: Diagnosis not present

## 2016-01-22 DIAGNOSIS — E611 Iron deficiency: Secondary | ICD-10-CM | POA: Diagnosis not present

## 2016-01-22 DIAGNOSIS — D472 Monoclonal gammopathy: Secondary | ICD-10-CM | POA: Diagnosis not present

## 2016-01-22 DIAGNOSIS — N186 End stage renal disease: Secondary | ICD-10-CM | POA: Diagnosis not present

## 2016-01-22 DIAGNOSIS — N2581 Secondary hyperparathyroidism of renal origin: Secondary | ICD-10-CM | POA: Diagnosis not present

## 2016-01-22 DIAGNOSIS — Z992 Dependence on renal dialysis: Secondary | ICD-10-CM | POA: Diagnosis not present

## 2016-01-22 DIAGNOSIS — R112 Nausea with vomiting, unspecified: Secondary | ICD-10-CM | POA: Diagnosis not present

## 2016-01-24 DIAGNOSIS — R112 Nausea with vomiting, unspecified: Secondary | ICD-10-CM | POA: Diagnosis not present

## 2016-01-24 DIAGNOSIS — N2581 Secondary hyperparathyroidism of renal origin: Secondary | ICD-10-CM | POA: Diagnosis not present

## 2016-01-24 DIAGNOSIS — N186 End stage renal disease: Secondary | ICD-10-CM | POA: Diagnosis not present

## 2016-01-24 DIAGNOSIS — Z992 Dependence on renal dialysis: Secondary | ICD-10-CM | POA: Diagnosis not present

## 2016-01-24 DIAGNOSIS — E611 Iron deficiency: Secondary | ICD-10-CM | POA: Diagnosis not present

## 2016-01-24 DIAGNOSIS — D472 Monoclonal gammopathy: Secondary | ICD-10-CM | POA: Diagnosis not present

## 2016-01-27 DIAGNOSIS — R112 Nausea with vomiting, unspecified: Secondary | ICD-10-CM | POA: Diagnosis not present

## 2016-01-27 DIAGNOSIS — N2581 Secondary hyperparathyroidism of renal origin: Secondary | ICD-10-CM | POA: Diagnosis not present

## 2016-01-27 DIAGNOSIS — F411 Generalized anxiety disorder: Secondary | ICD-10-CM | POA: Diagnosis not present

## 2016-01-27 DIAGNOSIS — E611 Iron deficiency: Secondary | ICD-10-CM | POA: Diagnosis not present

## 2016-01-27 DIAGNOSIS — F331 Major depressive disorder, recurrent, moderate: Secondary | ICD-10-CM | POA: Diagnosis not present

## 2016-01-27 DIAGNOSIS — N186 End stage renal disease: Secondary | ICD-10-CM | POA: Diagnosis not present

## 2016-01-27 DIAGNOSIS — D472 Monoclonal gammopathy: Secondary | ICD-10-CM | POA: Diagnosis not present

## 2016-01-27 DIAGNOSIS — Z992 Dependence on renal dialysis: Secondary | ICD-10-CM | POA: Diagnosis not present

## 2016-01-28 ENCOUNTER — Ambulatory Visit (INDEPENDENT_AMBULATORY_CARE_PROVIDER_SITE_OTHER): Payer: Medicare Other | Admitting: Infectious Diseases

## 2016-01-28 ENCOUNTER — Encounter: Payer: Self-pay | Admitting: Infectious Diseases

## 2016-01-28 DIAGNOSIS — Z992 Dependence on renal dialysis: Secondary | ICD-10-CM

## 2016-01-28 DIAGNOSIS — Z789 Other specified health status: Secondary | ICD-10-CM | POA: Insufficient documentation

## 2016-01-28 DIAGNOSIS — B20 Human immunodeficiency virus [HIV] disease: Secondary | ICD-10-CM

## 2016-01-28 DIAGNOSIS — N186 End stage renal disease: Secondary | ICD-10-CM

## 2016-01-28 NOTE — Assessment & Plan Note (Addendum)
She is doing well Will check her CD4 and HIV RNA at HD She has gotten flu shot Her pnvx is up to date. Asked her about living will and HCPOA.  She is full code per rn here with here. She concurs with this.  I have asked RN from SNF to help her get these done  rtc in 6 months

## 2016-01-28 NOTE — Assessment & Plan Note (Signed)
Appreciate f/u of renal at SNF She has non-functioning HD sites in both arms.  Query if she will run out of access.

## 2016-01-28 NOTE — Progress Notes (Signed)
   Subjective:    Patient ID: [name], female    DOB: [birth year], 65 y.o.   MRN: [id number]  HPI 65 yo F with HIV+, ESRD, diastolic CHF, parox aflutter, prev normal cath at [facility] (2016).staying at SNF [facility]). She is on DTGV/AZT/3TC dosed for her HD. She is without complaints except for stubbing her toe at SNF recently.  She denies sx from her BP.  Has gotten flu shot.   CV- [place], P Renal- [place]  HIV 1 RNA Quant (copies/mL)  Date Value  07/23/2015 51 (H)  02/12/2015 <20  03/26/2014 <20   CD4 T Cell Abs (/uL)  Date Value  07/23/2015 250 (L)  02/12/2015 280 (L)  03/26/2014 140 (L)     Review of Systems  Constitutional: Positive for appetite change. Negative for unexpected weight change.  Respiratory: Negative for shortness of breath.   Cardiovascular: Negative for chest pain.  Gastrointestinal: Negative for constipation and diarrhea.  Genitourinary: Negative for difficulty urinating.  Neurological: Negative for headaches.  makes "a little bit" of urine.  Fistula is swollen.     Objective:   Physical Exam  Constitutional: She appears well-developed and well-nourished.  Eyes: EOM are normal. Pupils are equal, round, and reactive to light.  Neck: Neck supple.  Cardiovascular: Normal rate, regular rhythm and normal heart sounds.   Pulmonary/Chest: Effort normal and breath sounds normal.  Abdominal: Soft. Bowel sounds are normal. There is no tenderness. There is no rebound.  Musculoskeletal: She exhibits no edema.       Arms: Lymphadenopathy:    She has no cervical adenopathy.      Assessment & Plan:

## 2016-01-29 DIAGNOSIS — Z992 Dependence on renal dialysis: Secondary | ICD-10-CM | POA: Diagnosis not present

## 2016-01-29 DIAGNOSIS — D472 Monoclonal gammopathy: Secondary | ICD-10-CM | POA: Diagnosis not present

## 2016-01-29 DIAGNOSIS — E611 Iron deficiency: Secondary | ICD-10-CM | POA: Diagnosis not present

## 2016-01-29 DIAGNOSIS — N186 End stage renal disease: Secondary | ICD-10-CM | POA: Diagnosis not present

## 2016-01-29 DIAGNOSIS — N2581 Secondary hyperparathyroidism of renal origin: Secondary | ICD-10-CM | POA: Diagnosis not present

## 2016-01-29 DIAGNOSIS — R112 Nausea with vomiting, unspecified: Secondary | ICD-10-CM | POA: Diagnosis not present

## 2016-01-30 DIAGNOSIS — Z992 Dependence on renal dialysis: Secondary | ICD-10-CM | POA: Diagnosis not present

## 2016-01-30 DIAGNOSIS — N186 End stage renal disease: Secondary | ICD-10-CM | POA: Diagnosis not present

## 2016-01-31 DIAGNOSIS — N2581 Secondary hyperparathyroidism of renal origin: Secondary | ICD-10-CM | POA: Diagnosis not present

## 2016-01-31 DIAGNOSIS — N186 End stage renal disease: Secondary | ICD-10-CM | POA: Diagnosis not present

## 2016-01-31 DIAGNOSIS — D631 Anemia in chronic kidney disease: Secondary | ICD-10-CM | POA: Diagnosis not present

## 2016-01-31 DIAGNOSIS — Z992 Dependence on renal dialysis: Secondary | ICD-10-CM | POA: Diagnosis not present

## 2016-01-31 DIAGNOSIS — D472 Monoclonal gammopathy: Secondary | ICD-10-CM | POA: Diagnosis not present

## 2016-01-31 DIAGNOSIS — E611 Iron deficiency: Secondary | ICD-10-CM | POA: Diagnosis not present

## 2016-02-03 DIAGNOSIS — Z992 Dependence on renal dialysis: Secondary | ICD-10-CM | POA: Diagnosis not present

## 2016-02-03 DIAGNOSIS — D472 Monoclonal gammopathy: Secondary | ICD-10-CM | POA: Diagnosis not present

## 2016-02-03 DIAGNOSIS — N2581 Secondary hyperparathyroidism of renal origin: Secondary | ICD-10-CM | POA: Diagnosis not present

## 2016-02-03 DIAGNOSIS — E611 Iron deficiency: Secondary | ICD-10-CM | POA: Diagnosis not present

## 2016-02-03 DIAGNOSIS — N186 End stage renal disease: Secondary | ICD-10-CM | POA: Diagnosis not present

## 2016-02-03 DIAGNOSIS — D631 Anemia in chronic kidney disease: Secondary | ICD-10-CM | POA: Diagnosis not present

## 2016-02-05 DIAGNOSIS — E611 Iron deficiency: Secondary | ICD-10-CM | POA: Diagnosis not present

## 2016-02-05 DIAGNOSIS — N186 End stage renal disease: Secondary | ICD-10-CM | POA: Diagnosis not present

## 2016-02-05 DIAGNOSIS — Z992 Dependence on renal dialysis: Secondary | ICD-10-CM | POA: Diagnosis not present

## 2016-02-05 DIAGNOSIS — D631 Anemia in chronic kidney disease: Secondary | ICD-10-CM | POA: Diagnosis not present

## 2016-02-05 DIAGNOSIS — D472 Monoclonal gammopathy: Secondary | ICD-10-CM | POA: Diagnosis not present

## 2016-02-05 DIAGNOSIS — N2581 Secondary hyperparathyroidism of renal origin: Secondary | ICD-10-CM | POA: Diagnosis not present

## 2016-02-07 DIAGNOSIS — D472 Monoclonal gammopathy: Secondary | ICD-10-CM | POA: Diagnosis not present

## 2016-02-07 DIAGNOSIS — N2581 Secondary hyperparathyroidism of renal origin: Secondary | ICD-10-CM | POA: Diagnosis not present

## 2016-02-07 DIAGNOSIS — N186 End stage renal disease: Secondary | ICD-10-CM | POA: Diagnosis not present

## 2016-02-07 DIAGNOSIS — D631 Anemia in chronic kidney disease: Secondary | ICD-10-CM | POA: Diagnosis not present

## 2016-02-07 DIAGNOSIS — E611 Iron deficiency: Secondary | ICD-10-CM | POA: Diagnosis not present

## 2016-02-07 DIAGNOSIS — Z992 Dependence on renal dialysis: Secondary | ICD-10-CM | POA: Diagnosis not present

## 2016-02-10 DIAGNOSIS — B20 Human immunodeficiency virus [HIV] disease: Secondary | ICD-10-CM | POA: Diagnosis not present

## 2016-02-10 DIAGNOSIS — D631 Anemia in chronic kidney disease: Secondary | ICD-10-CM | POA: Diagnosis not present

## 2016-02-10 DIAGNOSIS — N2581 Secondary hyperparathyroidism of renal origin: Secondary | ICD-10-CM | POA: Diagnosis not present

## 2016-02-10 DIAGNOSIS — N186 End stage renal disease: Secondary | ICD-10-CM | POA: Diagnosis not present

## 2016-02-10 DIAGNOSIS — E611 Iron deficiency: Secondary | ICD-10-CM | POA: Diagnosis not present

## 2016-02-10 DIAGNOSIS — D472 Monoclonal gammopathy: Secondary | ICD-10-CM | POA: Diagnosis not present

## 2016-02-10 DIAGNOSIS — Z992 Dependence on renal dialysis: Secondary | ICD-10-CM | POA: Diagnosis not present

## 2016-02-10 DIAGNOSIS — D649 Anemia, unspecified: Secondary | ICD-10-CM | POA: Diagnosis not present

## 2016-02-12 DIAGNOSIS — D472 Monoclonal gammopathy: Secondary | ICD-10-CM | POA: Diagnosis not present

## 2016-02-12 DIAGNOSIS — N2581 Secondary hyperparathyroidism of renal origin: Secondary | ICD-10-CM | POA: Diagnosis not present

## 2016-02-12 DIAGNOSIS — E611 Iron deficiency: Secondary | ICD-10-CM | POA: Diagnosis not present

## 2016-02-12 DIAGNOSIS — Z992 Dependence on renal dialysis: Secondary | ICD-10-CM | POA: Diagnosis not present

## 2016-02-12 DIAGNOSIS — N186 End stage renal disease: Secondary | ICD-10-CM | POA: Diagnosis not present

## 2016-02-12 DIAGNOSIS — D631 Anemia in chronic kidney disease: Secondary | ICD-10-CM | POA: Diagnosis not present

## 2016-02-14 DIAGNOSIS — Z992 Dependence on renal dialysis: Secondary | ICD-10-CM | POA: Diagnosis not present

## 2016-02-14 DIAGNOSIS — N186 End stage renal disease: Secondary | ICD-10-CM | POA: Diagnosis not present

## 2016-02-14 DIAGNOSIS — N2581 Secondary hyperparathyroidism of renal origin: Secondary | ICD-10-CM | POA: Diagnosis not present

## 2016-02-14 DIAGNOSIS — D631 Anemia in chronic kidney disease: Secondary | ICD-10-CM | POA: Diagnosis not present

## 2016-02-14 DIAGNOSIS — D472 Monoclonal gammopathy: Secondary | ICD-10-CM | POA: Diagnosis not present

## 2016-02-14 DIAGNOSIS — E611 Iron deficiency: Secondary | ICD-10-CM | POA: Diagnosis not present

## 2016-02-15 DIAGNOSIS — G894 Chronic pain syndrome: Secondary | ICD-10-CM | POA: Diagnosis not present

## 2016-02-17 DIAGNOSIS — D472 Monoclonal gammopathy: Secondary | ICD-10-CM | POA: Diagnosis not present

## 2016-02-17 DIAGNOSIS — Z992 Dependence on renal dialysis: Secondary | ICD-10-CM | POA: Diagnosis not present

## 2016-02-17 DIAGNOSIS — N2581 Secondary hyperparathyroidism of renal origin: Secondary | ICD-10-CM | POA: Diagnosis not present

## 2016-02-17 DIAGNOSIS — N186 End stage renal disease: Secondary | ICD-10-CM | POA: Diagnosis not present

## 2016-02-17 DIAGNOSIS — D631 Anemia in chronic kidney disease: Secondary | ICD-10-CM | POA: Diagnosis not present

## 2016-02-17 DIAGNOSIS — E611 Iron deficiency: Secondary | ICD-10-CM | POA: Diagnosis not present

## 2016-02-19 DIAGNOSIS — N186 End stage renal disease: Secondary | ICD-10-CM | POA: Diagnosis not present

## 2016-02-19 DIAGNOSIS — D631 Anemia in chronic kidney disease: Secondary | ICD-10-CM | POA: Diagnosis not present

## 2016-02-19 DIAGNOSIS — D472 Monoclonal gammopathy: Secondary | ICD-10-CM | POA: Diagnosis not present

## 2016-02-19 DIAGNOSIS — N2581 Secondary hyperparathyroidism of renal origin: Secondary | ICD-10-CM | POA: Diagnosis not present

## 2016-02-19 DIAGNOSIS — E611 Iron deficiency: Secondary | ICD-10-CM | POA: Diagnosis not present

## 2016-02-19 DIAGNOSIS — Z992 Dependence on renal dialysis: Secondary | ICD-10-CM | POA: Diagnosis not present

## 2016-02-21 DIAGNOSIS — D472 Monoclonal gammopathy: Secondary | ICD-10-CM | POA: Diagnosis not present

## 2016-02-21 DIAGNOSIS — N2581 Secondary hyperparathyroidism of renal origin: Secondary | ICD-10-CM | POA: Diagnosis not present

## 2016-02-21 DIAGNOSIS — Z992 Dependence on renal dialysis: Secondary | ICD-10-CM | POA: Diagnosis not present

## 2016-02-21 DIAGNOSIS — D631 Anemia in chronic kidney disease: Secondary | ICD-10-CM | POA: Diagnosis not present

## 2016-02-21 DIAGNOSIS — N186 End stage renal disease: Secondary | ICD-10-CM | POA: Diagnosis not present

## 2016-02-21 DIAGNOSIS — E611 Iron deficiency: Secondary | ICD-10-CM | POA: Diagnosis not present

## 2016-02-23 DIAGNOSIS — N2581 Secondary hyperparathyroidism of renal origin: Secondary | ICD-10-CM | POA: Diagnosis not present

## 2016-02-23 DIAGNOSIS — N186 End stage renal disease: Secondary | ICD-10-CM | POA: Diagnosis not present

## 2016-02-23 DIAGNOSIS — D631 Anemia in chronic kidney disease: Secondary | ICD-10-CM | POA: Diagnosis not present

## 2016-02-23 DIAGNOSIS — E611 Iron deficiency: Secondary | ICD-10-CM | POA: Diagnosis not present

## 2016-02-23 DIAGNOSIS — D472 Monoclonal gammopathy: Secondary | ICD-10-CM | POA: Diagnosis not present

## 2016-02-23 DIAGNOSIS — Z992 Dependence on renal dialysis: Secondary | ICD-10-CM | POA: Diagnosis not present

## 2016-02-26 DIAGNOSIS — N186 End stage renal disease: Secondary | ICD-10-CM | POA: Diagnosis not present

## 2016-02-26 DIAGNOSIS — D472 Monoclonal gammopathy: Secondary | ICD-10-CM | POA: Diagnosis not present

## 2016-02-26 DIAGNOSIS — D631 Anemia in chronic kidney disease: Secondary | ICD-10-CM | POA: Diagnosis not present

## 2016-02-26 DIAGNOSIS — E611 Iron deficiency: Secondary | ICD-10-CM | POA: Diagnosis not present

## 2016-02-26 DIAGNOSIS — N2581 Secondary hyperparathyroidism of renal origin: Secondary | ICD-10-CM | POA: Diagnosis not present

## 2016-02-26 DIAGNOSIS — Z992 Dependence on renal dialysis: Secondary | ICD-10-CM | POA: Diagnosis not present

## 2016-02-28 DIAGNOSIS — Z992 Dependence on renal dialysis: Secondary | ICD-10-CM | POA: Diagnosis not present

## 2016-02-28 DIAGNOSIS — N186 End stage renal disease: Secondary | ICD-10-CM | POA: Diagnosis not present

## 2016-02-28 DIAGNOSIS — D472 Monoclonal gammopathy: Secondary | ICD-10-CM | POA: Diagnosis not present

## 2016-02-28 DIAGNOSIS — D631 Anemia in chronic kidney disease: Secondary | ICD-10-CM | POA: Diagnosis not present

## 2016-02-28 DIAGNOSIS — E611 Iron deficiency: Secondary | ICD-10-CM | POA: Diagnosis not present

## 2016-02-28 DIAGNOSIS — N2581 Secondary hyperparathyroidism of renal origin: Secondary | ICD-10-CM | POA: Diagnosis not present

## 2016-03-01 DIAGNOSIS — N186 End stage renal disease: Secondary | ICD-10-CM | POA: Diagnosis not present

## 2016-03-01 DIAGNOSIS — Z992 Dependence on renal dialysis: Secondary | ICD-10-CM | POA: Diagnosis not present

## 2016-03-02 DIAGNOSIS — D631 Anemia in chronic kidney disease: Secondary | ICD-10-CM | POA: Diagnosis not present

## 2016-03-02 DIAGNOSIS — N2581 Secondary hyperparathyroidism of renal origin: Secondary | ICD-10-CM | POA: Diagnosis not present

## 2016-03-02 DIAGNOSIS — R112 Nausea with vomiting, unspecified: Secondary | ICD-10-CM | POA: Diagnosis not present

## 2016-03-02 DIAGNOSIS — D472 Monoclonal gammopathy: Secondary | ICD-10-CM | POA: Diagnosis not present

## 2016-03-02 DIAGNOSIS — N186 End stage renal disease: Secondary | ICD-10-CM | POA: Diagnosis not present

## 2016-03-02 DIAGNOSIS — Z992 Dependence on renal dialysis: Secondary | ICD-10-CM | POA: Diagnosis not present

## 2016-03-03 ENCOUNTER — Telehealth: Payer: Self-pay | Admitting: Pharmacist

## 2016-03-03 NOTE — Telephone Encounter (Signed)
Patience called from [facility] and said the patient is refusing to take the 25 mg Epivir liquid because of the taste.  She is currently on that dose for her ESRD. Denessa is asking if they can quarter the 150 mg tablet of Epivir and make it 37.5 mg. I explained it would be easier to half the 100 mg tablet and give the patient Epivir 50 mg, which is fine in ESRD patients.  Gave verbal order to switch dose. FYI.

## 2016-03-04 DIAGNOSIS — D631 Anemia in chronic kidney disease: Secondary | ICD-10-CM | POA: Diagnosis not present

## 2016-03-04 DIAGNOSIS — N2581 Secondary hyperparathyroidism of renal origin: Secondary | ICD-10-CM | POA: Diagnosis not present

## 2016-03-04 DIAGNOSIS — N186 End stage renal disease: Secondary | ICD-10-CM | POA: Diagnosis not present

## 2016-03-04 DIAGNOSIS — R112 Nausea with vomiting, unspecified: Secondary | ICD-10-CM | POA: Diagnosis not present

## 2016-03-04 DIAGNOSIS — D472 Monoclonal gammopathy: Secondary | ICD-10-CM | POA: Diagnosis not present

## 2016-03-04 DIAGNOSIS — Z992 Dependence on renal dialysis: Secondary | ICD-10-CM | POA: Diagnosis not present

## 2016-03-06 DIAGNOSIS — Z992 Dependence on renal dialysis: Secondary | ICD-10-CM | POA: Diagnosis not present

## 2016-03-06 DIAGNOSIS — N186 End stage renal disease: Secondary | ICD-10-CM | POA: Diagnosis not present

## 2016-03-06 DIAGNOSIS — D631 Anemia in chronic kidney disease: Secondary | ICD-10-CM | POA: Diagnosis not present

## 2016-03-06 DIAGNOSIS — R112 Nausea with vomiting, unspecified: Secondary | ICD-10-CM | POA: Diagnosis not present

## 2016-03-06 DIAGNOSIS — D472 Monoclonal gammopathy: Secondary | ICD-10-CM | POA: Diagnosis not present

## 2016-03-06 DIAGNOSIS — N2581 Secondary hyperparathyroidism of renal origin: Secondary | ICD-10-CM | POA: Diagnosis not present

## 2016-03-08 DIAGNOSIS — R531 Weakness: Secondary | ICD-10-CM | POA: Diagnosis not present

## 2016-03-08 DIAGNOSIS — E875 Hyperkalemia: Secondary | ICD-10-CM | POA: Diagnosis not present

## 2016-03-08 DIAGNOSIS — K219 Gastro-esophageal reflux disease without esophagitis: Secondary | ICD-10-CM | POA: Diagnosis not present

## 2016-03-08 DIAGNOSIS — I10 Essential (primary) hypertension: Secondary | ICD-10-CM | POA: Diagnosis not present

## 2016-03-09 DIAGNOSIS — D472 Monoclonal gammopathy: Secondary | ICD-10-CM | POA: Diagnosis not present

## 2016-03-09 DIAGNOSIS — N2581 Secondary hyperparathyroidism of renal origin: Secondary | ICD-10-CM | POA: Diagnosis not present

## 2016-03-09 DIAGNOSIS — R112 Nausea with vomiting, unspecified: Secondary | ICD-10-CM | POA: Diagnosis not present

## 2016-03-09 DIAGNOSIS — N186 End stage renal disease: Secondary | ICD-10-CM | POA: Diagnosis not present

## 2016-03-09 DIAGNOSIS — Z992 Dependence on renal dialysis: Secondary | ICD-10-CM | POA: Diagnosis not present

## 2016-03-09 DIAGNOSIS — D631 Anemia in chronic kidney disease: Secondary | ICD-10-CM | POA: Diagnosis not present

## 2016-03-11 DIAGNOSIS — N186 End stage renal disease: Secondary | ICD-10-CM | POA: Diagnosis not present

## 2016-03-11 DIAGNOSIS — Z992 Dependence on renal dialysis: Secondary | ICD-10-CM | POA: Diagnosis not present

## 2016-03-11 DIAGNOSIS — R112 Nausea with vomiting, unspecified: Secondary | ICD-10-CM | POA: Diagnosis not present

## 2016-03-11 DIAGNOSIS — D472 Monoclonal gammopathy: Secondary | ICD-10-CM | POA: Diagnosis not present

## 2016-03-11 DIAGNOSIS — D631 Anemia in chronic kidney disease: Secondary | ICD-10-CM | POA: Diagnosis not present

## 2016-03-11 DIAGNOSIS — N2581 Secondary hyperparathyroidism of renal origin: Secondary | ICD-10-CM | POA: Diagnosis not present

## 2016-03-12 DIAGNOSIS — D631 Anemia in chronic kidney disease: Secondary | ICD-10-CM | POA: Diagnosis not present

## 2016-03-12 DIAGNOSIS — N2581 Secondary hyperparathyroidism of renal origin: Secondary | ICD-10-CM | POA: Diagnosis not present

## 2016-03-12 DIAGNOSIS — R112 Nausea with vomiting, unspecified: Secondary | ICD-10-CM | POA: Diagnosis not present

## 2016-03-12 DIAGNOSIS — D472 Monoclonal gammopathy: Secondary | ICD-10-CM | POA: Diagnosis not present

## 2016-03-12 DIAGNOSIS — Z992 Dependence on renal dialysis: Secondary | ICD-10-CM | POA: Diagnosis not present

## 2016-03-12 DIAGNOSIS — N186 End stage renal disease: Secondary | ICD-10-CM | POA: Diagnosis not present

## 2016-03-13 DIAGNOSIS — D631 Anemia in chronic kidney disease: Secondary | ICD-10-CM | POA: Diagnosis not present

## 2016-03-13 DIAGNOSIS — D472 Monoclonal gammopathy: Secondary | ICD-10-CM | POA: Diagnosis not present

## 2016-03-13 DIAGNOSIS — R112 Nausea with vomiting, unspecified: Secondary | ICD-10-CM | POA: Diagnosis not present

## 2016-03-13 DIAGNOSIS — N186 End stage renal disease: Secondary | ICD-10-CM | POA: Diagnosis not present

## 2016-03-13 DIAGNOSIS — Z992 Dependence on renal dialysis: Secondary | ICD-10-CM | POA: Diagnosis not present

## 2016-03-13 DIAGNOSIS — N2581 Secondary hyperparathyroidism of renal origin: Secondary | ICD-10-CM | POA: Diagnosis not present

## 2016-03-16 DIAGNOSIS — Z992 Dependence on renal dialysis: Secondary | ICD-10-CM | POA: Diagnosis not present

## 2016-03-16 DIAGNOSIS — F331 Major depressive disorder, recurrent, moderate: Secondary | ICD-10-CM | POA: Diagnosis not present

## 2016-03-16 DIAGNOSIS — F411 Generalized anxiety disorder: Secondary | ICD-10-CM | POA: Diagnosis not present

## 2016-03-16 DIAGNOSIS — D472 Monoclonal gammopathy: Secondary | ICD-10-CM | POA: Diagnosis not present

## 2016-03-16 DIAGNOSIS — N186 End stage renal disease: Secondary | ICD-10-CM | POA: Diagnosis not present

## 2016-03-16 DIAGNOSIS — D649 Anemia, unspecified: Secondary | ICD-10-CM | POA: Diagnosis not present

## 2016-03-16 DIAGNOSIS — F5101 Primary insomnia: Secondary | ICD-10-CM | POA: Diagnosis not present

## 2016-03-16 DIAGNOSIS — N2581 Secondary hyperparathyroidism of renal origin: Secondary | ICD-10-CM | POA: Diagnosis not present

## 2016-03-16 DIAGNOSIS — R112 Nausea with vomiting, unspecified: Secondary | ICD-10-CM | POA: Diagnosis not present

## 2016-03-16 DIAGNOSIS — D631 Anemia in chronic kidney disease: Secondary | ICD-10-CM | POA: Diagnosis not present

## 2016-03-19 DIAGNOSIS — D631 Anemia in chronic kidney disease: Secondary | ICD-10-CM | POA: Diagnosis not present

## 2016-03-19 DIAGNOSIS — Z992 Dependence on renal dialysis: Secondary | ICD-10-CM | POA: Diagnosis not present

## 2016-03-19 DIAGNOSIS — D472 Monoclonal gammopathy: Secondary | ICD-10-CM | POA: Diagnosis not present

## 2016-03-19 DIAGNOSIS — R112 Nausea with vomiting, unspecified: Secondary | ICD-10-CM | POA: Diagnosis not present

## 2016-03-19 DIAGNOSIS — N2581 Secondary hyperparathyroidism of renal origin: Secondary | ICD-10-CM | POA: Diagnosis not present

## 2016-03-19 DIAGNOSIS — N186 End stage renal disease: Secondary | ICD-10-CM | POA: Diagnosis not present

## 2016-03-20 DIAGNOSIS — N186 End stage renal disease: Secondary | ICD-10-CM | POA: Diagnosis not present

## 2016-03-20 DIAGNOSIS — Z992 Dependence on renal dialysis: Secondary | ICD-10-CM | POA: Diagnosis not present

## 2016-03-20 DIAGNOSIS — N2581 Secondary hyperparathyroidism of renal origin: Secondary | ICD-10-CM | POA: Diagnosis not present

## 2016-03-20 DIAGNOSIS — D472 Monoclonal gammopathy: Secondary | ICD-10-CM | POA: Diagnosis not present

## 2016-03-20 DIAGNOSIS — D631 Anemia in chronic kidney disease: Secondary | ICD-10-CM | POA: Diagnosis not present

## 2016-03-20 DIAGNOSIS — R112 Nausea with vomiting, unspecified: Secondary | ICD-10-CM | POA: Diagnosis not present

## 2016-03-23 DIAGNOSIS — Z992 Dependence on renal dialysis: Secondary | ICD-10-CM | POA: Diagnosis not present

## 2016-03-23 DIAGNOSIS — N2581 Secondary hyperparathyroidism of renal origin: Secondary | ICD-10-CM | POA: Diagnosis not present

## 2016-03-23 DIAGNOSIS — R112 Nausea with vomiting, unspecified: Secondary | ICD-10-CM | POA: Diagnosis not present

## 2016-03-23 DIAGNOSIS — D472 Monoclonal gammopathy: Secondary | ICD-10-CM | POA: Diagnosis not present

## 2016-03-23 DIAGNOSIS — D631 Anemia in chronic kidney disease: Secondary | ICD-10-CM | POA: Diagnosis not present

## 2016-03-23 DIAGNOSIS — N186 End stage renal disease: Secondary | ICD-10-CM | POA: Diagnosis not present

## 2016-03-25 DIAGNOSIS — D631 Anemia in chronic kidney disease: Secondary | ICD-10-CM | POA: Diagnosis not present

## 2016-03-25 DIAGNOSIS — N2581 Secondary hyperparathyroidism of renal origin: Secondary | ICD-10-CM | POA: Diagnosis not present

## 2016-03-25 DIAGNOSIS — N186 End stage renal disease: Secondary | ICD-10-CM | POA: Diagnosis not present

## 2016-03-25 DIAGNOSIS — R112 Nausea with vomiting, unspecified: Secondary | ICD-10-CM | POA: Diagnosis not present

## 2016-03-25 DIAGNOSIS — D472 Monoclonal gammopathy: Secondary | ICD-10-CM | POA: Diagnosis not present

## 2016-03-25 DIAGNOSIS — Z992 Dependence on renal dialysis: Secondary | ICD-10-CM | POA: Diagnosis not present

## 2016-03-26 DIAGNOSIS — G894 Chronic pain syndrome: Secondary | ICD-10-CM | POA: Diagnosis not present

## 2016-03-27 DIAGNOSIS — R112 Nausea with vomiting, unspecified: Secondary | ICD-10-CM | POA: Diagnosis not present

## 2016-03-27 DIAGNOSIS — Z992 Dependence on renal dialysis: Secondary | ICD-10-CM | POA: Diagnosis not present

## 2016-03-27 DIAGNOSIS — N2581 Secondary hyperparathyroidism of renal origin: Secondary | ICD-10-CM | POA: Diagnosis not present

## 2016-03-27 DIAGNOSIS — D472 Monoclonal gammopathy: Secondary | ICD-10-CM | POA: Diagnosis not present

## 2016-03-27 DIAGNOSIS — D631 Anemia in chronic kidney disease: Secondary | ICD-10-CM | POA: Diagnosis not present

## 2016-03-27 DIAGNOSIS — N186 End stage renal disease: Secondary | ICD-10-CM | POA: Diagnosis not present

## 2016-03-30 DIAGNOSIS — N186 End stage renal disease: Secondary | ICD-10-CM | POA: Diagnosis not present

## 2016-03-30 DIAGNOSIS — D472 Monoclonal gammopathy: Secondary | ICD-10-CM | POA: Diagnosis not present

## 2016-03-30 DIAGNOSIS — F411 Generalized anxiety disorder: Secondary | ICD-10-CM | POA: Diagnosis not present

## 2016-03-30 DIAGNOSIS — Z992 Dependence on renal dialysis: Secondary | ICD-10-CM | POA: Diagnosis not present

## 2016-03-30 DIAGNOSIS — N2581 Secondary hyperparathyroidism of renal origin: Secondary | ICD-10-CM | POA: Diagnosis not present

## 2016-03-30 DIAGNOSIS — F331 Major depressive disorder, recurrent, moderate: Secondary | ICD-10-CM | POA: Diagnosis not present

## 2016-03-30 DIAGNOSIS — D631 Anemia in chronic kidney disease: Secondary | ICD-10-CM | POA: Diagnosis not present

## 2016-03-30 DIAGNOSIS — R112 Nausea with vomiting, unspecified: Secondary | ICD-10-CM | POA: Diagnosis not present

## 2016-04-01 DIAGNOSIS — N186 End stage renal disease: Secondary | ICD-10-CM | POA: Diagnosis not present

## 2016-04-01 DIAGNOSIS — N2581 Secondary hyperparathyroidism of renal origin: Secondary | ICD-10-CM | POA: Diagnosis not present

## 2016-04-01 DIAGNOSIS — D631 Anemia in chronic kidney disease: Secondary | ICD-10-CM | POA: Diagnosis not present

## 2016-04-01 DIAGNOSIS — D472 Monoclonal gammopathy: Secondary | ICD-10-CM | POA: Diagnosis not present

## 2016-04-01 DIAGNOSIS — Z992 Dependence on renal dialysis: Secondary | ICD-10-CM | POA: Diagnosis not present

## 2016-04-01 DIAGNOSIS — R112 Nausea with vomiting, unspecified: Secondary | ICD-10-CM | POA: Diagnosis not present

## 2016-04-02 DIAGNOSIS — Z992 Dependence on renal dialysis: Secondary | ICD-10-CM | POA: Diagnosis not present

## 2016-04-02 DIAGNOSIS — N2581 Secondary hyperparathyroidism of renal origin: Secondary | ICD-10-CM | POA: Diagnosis not present

## 2016-04-02 DIAGNOSIS — D472 Monoclonal gammopathy: Secondary | ICD-10-CM | POA: Diagnosis not present

## 2016-04-02 DIAGNOSIS — N186 End stage renal disease: Secondary | ICD-10-CM | POA: Diagnosis not present

## 2016-04-02 DIAGNOSIS — E611 Iron deficiency: Secondary | ICD-10-CM | POA: Diagnosis not present

## 2016-04-03 DIAGNOSIS — Z992 Dependence on renal dialysis: Secondary | ICD-10-CM | POA: Diagnosis not present

## 2016-04-03 DIAGNOSIS — D472 Monoclonal gammopathy: Secondary | ICD-10-CM | POA: Diagnosis not present

## 2016-04-03 DIAGNOSIS — N186 End stage renal disease: Secondary | ICD-10-CM | POA: Diagnosis not present

## 2016-04-03 DIAGNOSIS — E611 Iron deficiency: Secondary | ICD-10-CM | POA: Diagnosis not present

## 2016-04-03 DIAGNOSIS — N2581 Secondary hyperparathyroidism of renal origin: Secondary | ICD-10-CM | POA: Diagnosis not present

## 2016-04-06 DIAGNOSIS — Z992 Dependence on renal dialysis: Secondary | ICD-10-CM | POA: Diagnosis not present

## 2016-04-06 DIAGNOSIS — D472 Monoclonal gammopathy: Secondary | ICD-10-CM | POA: Diagnosis not present

## 2016-04-06 DIAGNOSIS — N186 End stage renal disease: Secondary | ICD-10-CM | POA: Diagnosis not present

## 2016-04-06 DIAGNOSIS — E611 Iron deficiency: Secondary | ICD-10-CM | POA: Diagnosis not present

## 2016-04-06 DIAGNOSIS — N2581 Secondary hyperparathyroidism of renal origin: Secondary | ICD-10-CM | POA: Diagnosis not present

## 2016-04-07 DIAGNOSIS — D649 Anemia, unspecified: Secondary | ICD-10-CM | POA: Diagnosis not present

## 2016-04-08 DIAGNOSIS — E611 Iron deficiency: Secondary | ICD-10-CM | POA: Diagnosis not present

## 2016-04-08 DIAGNOSIS — D472 Monoclonal gammopathy: Secondary | ICD-10-CM | POA: Diagnosis not present

## 2016-04-08 DIAGNOSIS — Z992 Dependence on renal dialysis: Secondary | ICD-10-CM | POA: Diagnosis not present

## 2016-04-08 DIAGNOSIS — N2581 Secondary hyperparathyroidism of renal origin: Secondary | ICD-10-CM | POA: Diagnosis not present

## 2016-04-08 DIAGNOSIS — N186 End stage renal disease: Secondary | ICD-10-CM | POA: Diagnosis not present

## 2016-04-10 DIAGNOSIS — D472 Monoclonal gammopathy: Secondary | ICD-10-CM | POA: Diagnosis not present

## 2016-04-10 DIAGNOSIS — N2581 Secondary hyperparathyroidism of renal origin: Secondary | ICD-10-CM | POA: Diagnosis not present

## 2016-04-10 DIAGNOSIS — E611 Iron deficiency: Secondary | ICD-10-CM | POA: Diagnosis not present

## 2016-04-10 DIAGNOSIS — N186 End stage renal disease: Secondary | ICD-10-CM | POA: Diagnosis not present

## 2016-04-10 DIAGNOSIS — Z992 Dependence on renal dialysis: Secondary | ICD-10-CM | POA: Diagnosis not present

## 2016-04-13 DIAGNOSIS — E611 Iron deficiency: Secondary | ICD-10-CM | POA: Diagnosis not present

## 2016-04-13 DIAGNOSIS — Z992 Dependence on renal dialysis: Secondary | ICD-10-CM | POA: Diagnosis not present

## 2016-04-13 DIAGNOSIS — D472 Monoclonal gammopathy: Secondary | ICD-10-CM | POA: Diagnosis not present

## 2016-04-13 DIAGNOSIS — N2581 Secondary hyperparathyroidism of renal origin: Secondary | ICD-10-CM | POA: Diagnosis not present

## 2016-04-13 DIAGNOSIS — N186 End stage renal disease: Secondary | ICD-10-CM | POA: Diagnosis not present

## 2016-04-16 ENCOUNTER — Emergency Department (HOSPITAL_BASED_OUTPATIENT_CLINIC_OR_DEPARTMENT_OTHER): Payer: Medicare Other

## 2016-04-16 ENCOUNTER — Observation Stay (HOSPITAL_COMMUNITY)
Admission: EM | Admit: 2016-04-16 | Discharge: 2016-04-19 | Disposition: A | Discharge status: Home / Self Care | Payer: Medicare Other | Attending: Internal Medicine | Admitting: Internal Medicine

## 2016-04-16 ENCOUNTER — Encounter (HOSPITAL_COMMUNITY): Payer: Self-pay | Admitting: *Deleted

## 2016-04-16 DIAGNOSIS — R600 Localized edema: Secondary | ICD-10-CM

## 2016-04-16 DIAGNOSIS — K219 Gastro-esophageal reflux disease without esophagitis: Secondary | ICD-10-CM | POA: Insufficient documentation

## 2016-04-16 DIAGNOSIS — D539 Nutritional anemia, unspecified: Secondary | ICD-10-CM

## 2016-04-16 DIAGNOSIS — E876 Hypokalemia: Secondary | ICD-10-CM | POA: Diagnosis not present

## 2016-04-16 DIAGNOSIS — I4891 Unspecified atrial fibrillation: Secondary | ICD-10-CM | POA: Diagnosis not present

## 2016-04-16 DIAGNOSIS — M79609 Pain in unspecified limb: Secondary | ICD-10-CM

## 2016-04-16 DIAGNOSIS — F419 Anxiety disorder, unspecified: Secondary | ICD-10-CM | POA: Diagnosis not present

## 2016-04-16 DIAGNOSIS — I10 Essential (primary) hypertension: Secondary | ICD-10-CM

## 2016-04-16 DIAGNOSIS — X58XXXA Exposure to other specified factors, initial encounter: Secondary | ICD-10-CM | POA: Insufficient documentation

## 2016-04-16 DIAGNOSIS — Z880 Allergy status to penicillin: Secondary | ICD-10-CM | POA: Insufficient documentation

## 2016-04-16 DIAGNOSIS — K439 Ventral hernia without obstruction or gangrene: Secondary | ICD-10-CM | POA: Insufficient documentation

## 2016-04-16 DIAGNOSIS — I871 Compression of vein: Secondary | ICD-10-CM | POA: Diagnosis not present

## 2016-04-16 DIAGNOSIS — Z992 Dependence on renal dialysis: Secondary | ICD-10-CM | POA: Diagnosis not present

## 2016-04-16 DIAGNOSIS — I4892 Unspecified atrial flutter: Secondary | ICD-10-CM | POA: Diagnosis not present

## 2016-04-16 DIAGNOSIS — M7989 Other specified soft tissue disorders: Secondary | ICD-10-CM

## 2016-04-16 DIAGNOSIS — Z87891 Personal history of nicotine dependence: Secondary | ICD-10-CM | POA: Insufficient documentation

## 2016-04-16 DIAGNOSIS — N2581 Secondary hyperparathyroidism of renal origin: Secondary | ICD-10-CM | POA: Diagnosis not present

## 2016-04-16 DIAGNOSIS — N186 End stage renal disease: Secondary | ICD-10-CM | POA: Diagnosis not present

## 2016-04-16 DIAGNOSIS — G4700 Insomnia, unspecified: Secondary | ICD-10-CM | POA: Insufficient documentation

## 2016-04-16 DIAGNOSIS — K2210 Ulcer of esophagus without bleeding: Secondary | ICD-10-CM | POA: Insufficient documentation

## 2016-04-16 DIAGNOSIS — I272 Pulmonary hypertension, unspecified: Secondary | ICD-10-CM | POA: Insufficient documentation

## 2016-04-16 DIAGNOSIS — D631 Anemia in chronic kidney disease: Secondary | ICD-10-CM | POA: Diagnosis not present

## 2016-04-16 DIAGNOSIS — Z90710 Acquired absence of both cervix and uterus: Secondary | ICD-10-CM | POA: Insufficient documentation

## 2016-04-16 DIAGNOSIS — Z91040 Latex allergy status: Secondary | ICD-10-CM | POA: Diagnosis not present

## 2016-04-16 DIAGNOSIS — Z21 Asymptomatic human immunodeficiency virus [HIV] infection status: Secondary | ICD-10-CM | POA: Insufficient documentation

## 2016-04-16 DIAGNOSIS — D638 Anemia in other chronic diseases classified elsewhere: Secondary | ICD-10-CM

## 2016-04-16 DIAGNOSIS — I132 Hypertensive heart and chronic kidney disease with heart failure and with stage 5 chronic kidney disease, or end stage renal disease: Secondary | ICD-10-CM | POA: Insufficient documentation

## 2016-04-16 DIAGNOSIS — E875 Hyperkalemia: Secondary | ICD-10-CM | POA: Diagnosis not present

## 2016-04-16 DIAGNOSIS — F329 Major depressive disorder, single episode, unspecified: Secondary | ICD-10-CM | POA: Insufficient documentation

## 2016-04-16 DIAGNOSIS — Z8619 Personal history of other infectious and parasitic diseases: Secondary | ICD-10-CM | POA: Insufficient documentation

## 2016-04-16 DIAGNOSIS — M79601 Pain in right arm: Secondary | ICD-10-CM | POA: Diagnosis not present

## 2016-04-16 DIAGNOSIS — R52 Pain, unspecified: Secondary | ICD-10-CM | POA: Diagnosis not present

## 2016-04-16 DIAGNOSIS — Z79899 Other long term (current) drug therapy: Secondary | ICD-10-CM | POA: Insufficient documentation

## 2016-04-16 DIAGNOSIS — I509 Heart failure, unspecified: Secondary | ICD-10-CM | POA: Insufficient documentation

## 2016-04-16 DIAGNOSIS — T82858A Stenosis of vascular prosthetic devices, implants and grafts, initial encounter: Principal | ICD-10-CM | POA: Insufficient documentation

## 2016-04-16 DIAGNOSIS — B20 Human immunodeficiency virus [HIV] disease: Secondary | ICD-10-CM | POA: Diagnosis present

## 2016-04-16 DIAGNOSIS — E611 Iron deficiency: Secondary | ICD-10-CM | POA: Diagnosis not present

## 2016-04-16 DIAGNOSIS — D472 Monoclonal gammopathy: Secondary | ICD-10-CM | POA: Diagnosis not present

## 2016-04-16 LAB — BASIC METABOLIC PANEL
ANION GAP: 15 (ref 5–15)
BUN: 20 mg/dL (ref 6–20)
CALCIUM: 9.3 mg/dL (ref 8.9–10.3)
CO2: 26 mmol/L (ref 22–32)
CREATININE: 6.67 mg/dL — AB (ref 0.44–1.00)
Chloride: 97 mmol/L — ABNORMAL LOW (ref 101–111)
GFR calc non Af Amer: 6 mL/min — ABNORMAL LOW (ref >60)
GFR, EST AFRICAN AMERICAN: 7 mL/min — AB (ref >60)
Glucose, Bld: 70 mg/dL (ref 65–99)
Potassium: 3.7 mmol/L (ref 3.5–5.1)
SODIUM: 138 mmol/L (ref 135–145)

## 2016-04-16 LAB — CBC WITH DIFFERENTIAL/PLATELET
BASOS ABS: 0 10*3/uL (ref 0.0–0.1)
BASOS PCT: 0 %
EOS ABS: 0.1 10*3/uL (ref 0.0–0.7)
Eosinophils Relative: 2 %
HCT: 35.8 % — ABNORMAL LOW (ref 36.0–46.0)
HEMOGLOBIN: 11.3 g/dL — AB (ref 12.0–15.0)
Lymphocytes Relative: 40 %
Lymphs Abs: 1.2 10*3/uL (ref 0.7–4.0)
MCH: 33.8 pg (ref 26.0–34.0)
MCHC: 31.6 g/dL (ref 30.0–36.0)
MCV: 107.2 fL — ABNORMAL HIGH (ref 78.0–100.0)
MONOS PCT: 8 %
Monocytes Absolute: 0.2 10*3/uL (ref 0.1–1.0)
NEUTROS ABS: 1.5 10*3/uL — AB (ref 1.7–7.7)
NEUTROS PCT: 50 %
Platelets: 137 10*3/uL — ABNORMAL LOW (ref 150–400)
RBC: 3.34 MIL/uL — ABNORMAL LOW (ref 3.87–5.11)
RDW: 15.1 % (ref 11.5–15.5)
WBC: 3 10*3/uL — ABNORMAL LOW (ref 4.0–10.5)

## 2016-04-16 MED ORDER — ONDANSETRON HCL 4 MG PO TABS: 4.0000 mg | ORAL_TABLET | Freq: Four times a day (QID) | ORAL | Status: DC | PRN

## 2016-04-16 MED ORDER — ACETAMINOPHEN 650 MG RE SUPP: 650.0000 mg | Freq: Four times a day (QID) | RECTAL | Status: DC | PRN

## 2016-04-16 MED ORDER — ACETAMINOPHEN 325 MG PO TABS
650.0000 mg | ORAL_TABLET | Freq: Four times a day (QID) | ORAL | Status: DC | PRN
  Administered 2016-04-17: 650 mg via ORAL
  Filled 2016-04-16: qty 2

## 2016-04-16 MED ORDER — ONDANSETRON HCL 4 MG/2ML IJ SOLN: 4.0000 mg | Freq: Four times a day (QID) | INTRAMUSCULAR | Status: DC | PRN

## 2016-04-16 MED ORDER — VITAMIN C 500 MG PO TABS
500.0000 mg | ORAL_TABLET | Freq: Two times a day (BID) | ORAL | Status: DC
  Administered 2016-04-17 – 2016-04-19 (×5): 500 mg via ORAL
  Filled 2016-04-16 (×5): qty 1

## 2016-04-16 MED ORDER — ALBUTEROL SULFATE (2.5 MG/3ML) 0.083% IN NEBU: 2.5000 mg | INHALATION_SOLUTION | RESPIRATORY_TRACT | Status: DC | PRN

## 2016-04-16 MED ORDER — SILDENAFIL CITRATE 20 MG PO TABS
20.0000 mg | ORAL_TABLET | Freq: Three times a day (TID) | ORAL | Status: DC
  Administered 2016-04-17 – 2016-04-19 (×6): 20 mg via ORAL
  Filled 2016-04-16 (×10): qty 1

## 2016-04-16 MED ORDER — RENA-VITE PO TABS
1.0000 | ORAL_TABLET | Freq: Every day | ORAL | Status: DC
  Administered 2016-04-17 – 2016-04-18 (×2): 1 via ORAL
  Filled 2016-04-16 (×2): qty 1

## 2016-04-16 MED ORDER — SEVELAMER CARBONATE 800 MG PO TABS
1600.0000 mg | ORAL_TABLET | Freq: Three times a day (TID) | ORAL | Status: DC
  Administered 2016-04-17 – 2016-04-19 (×5): 1600 mg via ORAL
  Filled 2016-04-16 (×6): qty 2

## 2016-04-16 MED ORDER — LAMIVUDINE 10 MG/ML PO SOLN
25.0000 mg | Freq: Every day | ORAL | Status: DC
  Administered 2016-04-17 – 2016-04-19 (×3): 25 mg via ORAL
  Filled 2016-04-16 (×4): qty 5

## 2016-04-16 MED ORDER — OXYCODONE-ACETAMINOPHEN 5-325 MG PO TABS
1.0000 | ORAL_TABLET | Freq: Four times a day (QID) | ORAL | Status: DC | PRN
  Administered 2016-04-16 – 2016-04-19 (×7): 1 via ORAL
  Filled 2016-04-16 (×6): qty 1

## 2016-04-16 MED ORDER — ACETAMINOPHEN 325 MG PO TABS
650.0000 mg | ORAL_TABLET | Freq: Once | ORAL | Status: AC
  Administered 2016-04-16: 650 mg via ORAL
  Filled 2016-04-16: qty 2

## 2016-04-16 MED ORDER — ZOLPIDEM TARTRATE 5 MG PO TABS
5.0000 mg | ORAL_TABLET | Freq: Every evening | ORAL | Status: DC | PRN
  Administered 2016-04-17 – 2016-04-18 (×2): 5 mg via ORAL
  Filled 2016-04-16 (×2): qty 1

## 2016-04-16 MED ORDER — HYDROCODONE-ACETAMINOPHEN 5-325 MG PO TABS
1.0000 | ORAL_TABLET | Freq: Once | ORAL | Status: AC
  Administered 2016-04-16: 1 via ORAL
  Filled 2016-04-16: qty 1

## 2016-04-16 MED ORDER — HYPROMELLOSE (GONIOSCOPIC) 2.5 % OP SOLN
1.0000 [drp] | Freq: Two times a day (BID) | OPHTHALMIC | Status: DC
  Filled 2016-04-16: qty 15

## 2016-04-16 MED ORDER — HEPARIN SODIUM (PORCINE) 5000 UNIT/ML IJ SOLN
5000.0000 [IU] | Freq: Three times a day (TID) | INTRAMUSCULAR | Status: DC
  Administered 2016-04-17 – 2016-04-19 (×6): 5000 [IU] via SUBCUTANEOUS
  Filled 2016-04-16 (×6): qty 1

## 2016-04-16 MED ORDER — PANTOPRAZOLE SODIUM 40 MG PO TBEC
40.0000 mg | DELAYED_RELEASE_TABLET | Freq: Every day | ORAL | Status: DC
  Administered 2016-04-17 – 2016-04-19 (×3): 40 mg via ORAL
  Filled 2016-04-16 (×3): qty 1

## 2016-04-16 NOTE — Plan of Care (Signed)
Problem: Safety: Goal: Ability to remain free from injury will improve Outcome: Progressing Call bell within reach. Encouraged patient to call with toileting. Bed in low position.

## 2016-04-16 NOTE — Progress Notes (Signed)
VASCULAR LAB PRELIMINARY  PRELIMINARY  PRELIMINARY  PRELIMINARY  Right upper extremity venous duplex completed.    Preliminary report:  Technically difficult due to extreme swelling and pain. No obvious evidence of DVT and fistula appears patent.  Paiton Fosco, [place], RVS 04/16/2016, 6:47 PM

## 2016-04-16 NOTE — H&P (Signed)
History and Physical    [name] FBP:[id number] DOB: 01-Nov-1950 DOA: 04/16/2016  Referring MD/NP/PA: Dr. Tomi Bamberger PCP: Cleda Mccreedy, MD  Patient coming from:  Dialysis Center via EMS  Chief Complaint: Right arm swelling and pain  HPI: [name] is a 66 y.o. female with medical history significant of ESRD on HD (M/W/F), HIV, HTN, and chronic anemia; who presents with complaints of right arm swelling and pain.  Right arm swelling has progressively worsened over the last 2 weeks. She reports having a normal hemodialysis session on 3 days ago, but thereafter noted having significant pain in the right arm. Patient reports pain felt like a bee sting and was constant. Tried taking Tylenol without any significant relief of symptoms. Her hemodialysis session was canceled yesterday due to the right upper arm swelling. She reports going to see a surgeon this morning who performed some kind of procedure for which she was able to have dialysis this afternoon. However due to the swelling and pain was sent here for further evaluation. Patient also reports associated symptoms of change in color the tips of her fingers. Denies having any shortness of breath, abdominal pain, nausea, vomiting, diarrhea, or fever.  ED Course: Upon admission to the emergency department patient was seen to be within normal limits. Lab work appears relatively unchanged from previous blood draws on file from 07/2015. Vascular Doppler ultrasound of the right upper extremity revealed no signs of venous thrombosis. Dr. Donzetta Matters of vascular surgery was consulted and will evaluate the patient in a.m. to determine if further studies/procedure needed. Hydrocodone provided minimal relief to the patient's symptoms.  Review of Systems: As per HPI otherwise 10 point review of systems negative.   Past Medical History:  Diagnosis Date  . Anemia   . Anxiety   . Atrial flutter ([place]) 02/22/2015  . C. difficile colitis 10/10/11  . Chronic  diarrhea   . Depression   . ESRD on hemodialysis ([place])   . HIV (human immunodeficiency virus infection) ([place])   . Hypertension   . Insomnia   . Intracranial hemorrhage ([place])   . Muscle weakness (generalized)   . Pulmonary HTN   . Traumatic hematoma of right upper arm 02/22/2015    Past Surgical History:  Procedure Laterality Date  . BIOPSY THYROID    . Dialysis Shunts     previous one removed from left arm and now present one in right arm  . ESOPHAGOGASTRODUODENOSCOPY  12/15/2010   Procedure: ESOPHAGOGASTRODUODENOSCOPY (EGD);  Surgeon: Daneil Dolin, MD;  Location: [facility] ENDO SUITE;  Service: Endoscopy;  Laterality: N/A;  . EXCISION OF BREAST BIOPSY Right 05/04/2012   Procedure: EXCISION OF BREAST BIOPSY;  Surgeon: Donato Heinz, MD;  Location: [facility] ORS;  Service: General;  Laterality: Right;  Right Excisional Breast Biopsy  . FISTULOGRAM Right 03/26/2014   Procedure: Right Arm Fistulogram with Venoplasty Right Subclavian Vein and Inominate Vein. Debridement Fistula Ulcer;  Surgeon: Conrad Chico, MD;  Location: [place];  Service: Vascular;  Laterality: Right;  . SHUNTOGRAM Right 10/19/2013   Procedure: FISTULOGRAM;  Surgeon: Conrad [place], MD;  Location: [facility] CATH LAB;  Service: Cardiovascular;  Laterality: Right;  . TONSILLECTOMY       reports that she quit smoking about 33 years ago. She has never used smokeless tobacco. She reports that she does not drink alcohol or use drugs.  Allergies  Allergen Reactions  . Latex Rash  . Penicillins Rash    Has patient had a PCN reaction  causing immediate rash, facial/tongue/throat swelling, SOB or lightheadedness with hypotension: No Has patient had a PCN reaction causing severe rash involving mucus membranes or skin necrosis: No Has patient had a PCN reaction that required hospitalization No Has patient had a PCN reaction occurring within the last 10 years: No If all of the above answers are "NO", then may proceed with Cephalosporin use.       Family History  Problem Relation Age of Onset  . Anesthesia problems Neg Hx   . Hypotension Neg Hx   . Malignant hyperthermia Neg Hx   . Pseudochol deficiency Neg Hx     Prior to Admission medications   Medication Sig Start Date End Date Taking? Authorizing Provider  buPROPion (WELLBUTRIN XL) 150 MG 24 hr tablet Take 150 mg by mouth daily.    Historical Provider, MD  diltiazem (CARDIZEM) 60 MG tablet Take 60 mg by mouth every 8 (eight) hours.    Historical Provider, MD  epoetin alfa (EPOGEN,PROCRIT) 67124 UNIT/ML injection Inject 8,000 Units into the skin 3 (three) times a week. To be given at dialysis, Monday Wednesday and Friday    Historical Provider, MD  fentaNYL (DURAGESIC - DOSED MCG/HR) 50 MCG/HR Apply one patch every 72 hours for pain. Remove old patch. External use only. Rotate sites. 02/28/15   Lauree Chandler, NP  hydroxypropyl methylcellulose (ISOPTO TEARS) 2.5 % ophthalmic solution Place 1 drop into both eyes 2 (two) times daily.      Historical Provider, MD  lamiVUDine (EPIVIR) 10 MG/ML solution Take 2.5 mLs (25 mg total) by mouth daily. 03/29/14   Virginia Crews, MD  lidocaine-prilocaine (EMLA) cream Apply 1 application topically 3 (three) times a week. Apply 1 hour prior to dialysis access site on Monday Wednesday and Friday    Historical Provider, MD  LORazepam (ATIVAN) 0.5 MG tablet Take 0.5 tablets (0.25 mg total) by mouth every 6 (six) hours as needed for anxiety. 02/28/15   Lauree Chandler, NP  metoprolol (LOPRESSOR) 50 MG tablet Take 50 mg by mouth 2 (two) times daily.    Historical Provider, MD  multivitamin (RENA-VIT) TABS tablet Take 1 tablet by mouth at bedtime.    Historical Provider, MD  omeprazole (PRILOSEC) 40 MG capsule Take 40 mg by mouth daily.  02/15/14   Historical Provider, MD  ondansetron (ZOFRAN) 4 MG tablet Take 2 mg by mouth 3 (three) times daily. nausea    Historical Provider, MD  oxyCODONE (OXY IR/ROXICODONE) 5 MG immediate release tablet  Take one tablet by mouth every 6 hours as needed for moderate pain 02/26/15   Lauree Chandler, NP  Oxycodone HCl 10 MG TABS Take 10 mg by mouth at bedtime.    Historical Provider, MD  raltegravir (ISENTRESS) 400 MG tablet Take 400 mg by mouth 2 (two) times daily.     Historical Provider, MD  sertraline (ZOLOFT) 100 MG tablet Take 100 mg by mouth daily.    Historical Provider, MD  sevelamer carbonate (RENVELA) 800 MG tablet Take 1,600 mg by mouth 3 (three) times daily with meals.     Historical Provider, MD  sildenafil (REVATIO) 20 MG tablet Take 20 mg by mouth 3 (three) times daily.     Historical Provider, MD  sulfamethoxazole-trimethoprim (BACTRIM,SEPTRA) 400-80 MG per tablet Take 1 tablet by mouth 3 (three) times a week. 03/29/14   Virginia Crews, MD  vitamin C (ASCORBIC ACID) 500 MG tablet Take 500 mg by mouth 2 (two) times daily.    Historical  Provider, MD  zaleplon (SONATA) 5 MG capsule Take one capsule by mouth every night at bedtime for rest 02/19/15   Lauree Chandler, NP  zidovudine (RETROVIR) 100 MG capsule Take 100 mg by mouth 3 (three) times daily.      Historical Provider, MD    Physical Exam:   Constitutional:Older female who appears to be in NAD, calm, comfortable  Vitals:   04/16/16 1745 04/16/16 1800 04/16/16 1915 04/16/16 2015  BP: 112/69 118/78 121/73 111/65  Pulse: 67 62 65 61  Resp: 20 16 20 16   Temp:      TempSrc:      SpO2: 99% 99% 97% 95%   Eyes: PERRL, lids and conjunctivae normal ENMT: Mucous membranes are moist. Posterior pharynx clear of any exudate or lesions.Normal dentition.  Neck: normal, supple, no masses, no thyromegaly Respiratory: clear to auscultation bilaterally, no wheezing, no crackles. Normal respiratory effort. No accessory muscle use.  Cardiovascular: Regular rate and rhythm, no murmurs / rubs / gallops. No extremity edema. 2+ pedal pulses. No carotid bruits. Right upper extremity fistula present.  Inactive fistulas noted of the left  upper extremity. Abdomen: no tenderness, no masses palpated. No hepatosplenomegaly. Bowel sounds positive.  Musculoskeletal: no clubbing. Right upper extremity 3 times the size of the left upper extremity.  Skin: no rashes, lesions, ulcers. No induration Neurologic: CN 2-12 grossly intact. Sensation intact, DTR normal. Strength 5/5 in all 4.  Psychiatric: Normal judgment and insight. Alert and oriented x 3. Normal mood.     Labs on Admission: I have personally reviewed following labs and imaging studies  CBC:  Recent Labs Lab 04/16/16 1605  WBC 3.0*  NEUTROABS 1.5*  HGB 11.3*  HCT 35.8*  MCV 107.2*  PLT 003*   Basic Metabolic Panel:  Recent Labs Lab 04/16/16 1605  NA 138  K 3.7  CL 97*  CO2 26  GLUCOSE 70  BUN 20  CREATININE 6.67*  CALCIUM 9.3   GFR: CrCl cannot be calculated (Unknown ideal weight.). Liver Function Tests: No results for input(s): AST, ALT, ALKPHOS, BILITOT, PROT, ALBUMIN in the last 168 hours. No results for input(s): LIPASE, AMYLASE in the last 168 hours. No results for input(s): AMMONIA in the last 168 hours. Coagulation Profile: No results for input(s): INR, PROTIME in the last 168 hours. Cardiac Enzymes: No results for input(s): CKTOTAL, CKMB, CKMBINDEX, TROPONINI in the last 168 hours. BNP (last 3 results) No results for input(s): PROBNP in the last 8760 hours. HbA1C: No results for input(s): HGBA1C in the last 72 hours. CBG: No results for input(s): GLUCAP in the last 168 hours. Lipid Profile: No results for input(s): CHOL, HDL, LDLCALC, TRIG, CHOLHDL, LDLDIRECT in the last 72 hours. Thyroid Function Tests: No results for input(s): TSH, T4TOTAL, FREET4, T3FREE, THYROIDAB in the last 72 hours. Anemia Panel: No results for input(s): VITAMINB12, FOLATE, FERRITIN, TIBC, IRON, RETICCTPCT in the last 72 hours. Urine analysis:    Component Value Date/Time   COLORURINE YELLOW 01/19/2009 1855   APPEARANCEUR CLEAR 01/19/2009 1855   LABSPEC  1.015 01/19/2009 1855   PHURINE 7.5 01/19/2009 1855   GLUCOSEU NEGATIVE 01/19/2009 1855   HGBUR SMALL (A) 01/19/2009 1855   BILIRUBINUR NEGATIVE 01/19/2009 1855   KETONESUR NEGATIVE 01/19/2009 1855   PROTEINUR 100 (A) 01/19/2009 1855   UROBILINOGEN 0.2 01/19/2009 1855   NITRITE NEGATIVE 01/19/2009 1855   LEUKOCYTESUR NEGATIVE 01/19/2009 1855   Sepsis Labs: No results found for this or any previous visit (from the past 240 hour(s)).  Radiological Exams on Admission: No results found.    Assessment/Plan Right upper extremity swelling and pain: Acute. Progressive swelling on the right upper extremity over the last 2-3 weeks. Vascular Doppler ultrasound was preliminary report was thought to be negative - Admit to MedSurg bed  - Follow-up vascular doppler U/S of RUE - Oxycodone prn pain - Appreciate vascular surgery consultative services, will f/u for further recommendations  ESRD on HD: Last hemodialysis session was reportedly partially performed today. - Message left on HD voicemail - Continue Renvela, Rena-Vite   HIV:  - Continue raltegravir, lamivudine, and zidovudine  Chronic anemia: Stable. - Repeat CBC in a.m.  PAH - continue Revatio  History of atrial flutter: Chadvasc =2 . Never started on anticoagulation.  -   DVT prophylaxis: heparin Code Status: Full Family Communication:  no family present at bedside  Disposition Plan: likely   discharge home once medically stable Consults called: Vascular surgery  Admission status: Observation  Norval Morton MD [facility] Pager [phone]  If 7PM-7AM, please contact night-coverage [email or web address] Password [facility]  04/16/2016, 8:36 PM

## 2016-04-16 NOTE — ED Provider Notes (Signed)
DVT US negative.  Labs are stable.  Pt's arm is significantly  Larger (approx 3 times) compared to her other arm.  Will consult with vasc surgery  Discussed case with Dr Donzetta Matters.  Pt may have a more proximal venous obstruction.  She will need a fistula gram.   Requests medical admission.  He will see her in the am and arrange for the procedure.   Dorie Rank, MD 04/16/16 2016

## 2016-04-16 NOTE — Progress Notes (Signed)
Patient admitted from ED with Right arm swelling. Alert and oriented. Vitals stable. Telemetry applied and confirmed by two staff.

## 2016-04-16 NOTE — ED Notes (Signed)
Patient transported to Ultrasound 

## 2016-04-16 NOTE — ED Provider Notes (Signed)
[place] [facility] DEPT Provider Note   CSN: [id number] Arrival date & time: 04/16/16  1357     History   Chief Complaint Chief Complaint  Patient presents with  . Arm Swelling    HPI [name] is a 66 y.o. female.  HPI  66 year old female with hx of HIV, ESRD on dialysis presents with right arm swelling x 1.5 weeks. Progressive, symmetric. Some numbness at fingertips. No weakness. Has felt warm, no redness. No chest pain or shortness of breath. Tylenol has not helped. No LUE or leg swelling. No injury.   Past Medical History:  Diagnosis Date  . Anemia   . Anxiety   . Atrial flutter ([place]) 02/22/2015  . C. difficile colitis 10/10/11  . Chronic diarrhea   . Depression   . ESRD on hemodialysis ([place])   . HIV (human immunodeficiency virus infection) ([place])   . Hypertension   . Insomnia   . Intracranial hemorrhage ([place])   . Muscle weakness (generalized)   . Pulmonary HTN   . Traumatic hematoma of right upper arm 02/22/2015    Patient Active Problem List   Diagnosis Date Noted  . Hepatitis B immune 01/28/2016  . Atrial flutter ([place]) 02/22/2015  . Traumatic hematoma of right upper arm 02/22/2015  . Hyperkalemia 02/20/2015  . Venous stenosis of right upper extremity 04/06/2014  . ESRD on dialysis ([place]) 04/06/2014  . Essential hypertension   . AV fistula infection ([place]) 03/25/2014  . Arm swelling 10/10/2013  . Nausea with vomiting 12/15/2012  . CHF, chronic ([place]) 11/25/2012  . Atrial fibrillation ([place]) 05/02/2012  . Intracranial bleed ([place]) 12/28/2011  . Angioedema of lips 09/21/2011  . Cellulitis of breast 03/09/2011  . Erosive esophagitis 12/15/2010  . Mallory - Weiss tear 12/15/2010  . UGI bleed 12/13/2010  . DIARRHEA 04/12/2009  . Human immunodeficiency virus (HIV) disease ([place]) 11/15/2008  . ALCOHOL ABUSE 11/15/2008  . CHF 11/15/2008  . PANCREATITIS, HX OF 11/15/2008  . TOBACCO USE, QUIT 11/15/2008  . PAIN IN JOINT, UPPER ARM 08/07/2008  . ABDOMINAL WALL  HERNIA 04/26/2007  . BRONCHITIS, ACUTE 11/16/2006  . HYPOKALEMIA, HX OF 07/13/2006  . ANEMIA, NORMOCYTIC, CHRONIC 03/23/2006  . ANXIETY 03/23/2006  . DEPRESSION 03/23/2006  . Essential hypertension, malignant 03/23/2006  . GERD 03/23/2006  . PANCREATITIS 03/23/2006  . MASS, RIGHT AXILLA 03/23/2006  . HEADACHE 03/23/2006  . SYMPTOM, ENLARGEMENT, LYMPH NODES 03/23/2006  . SHINGLES, HX OF 03/23/2006  . HYSTERECTOMY, TOTAL, HX OF 03/23/2006    Past Surgical History:  Procedure Laterality Date  . BIOPSY THYROID    . Dialysis Shunts     previous one removed from left arm and now present one in right arm  . ESOPHAGOGASTRODUODENOSCOPY  12/15/2010   Procedure: ESOPHAGOGASTRODUODENOSCOPY (EGD);  Surgeon: Daneil Dolin, MD;  Location: [facility];  Service: Endoscopy;  Laterality: N/A;  . EXCISION OF BREAST BIOPSY Right 05/04/2012   Procedure: EXCISION OF BREAST BIOPSY;  Surgeon: Donato Heinz, MD;  Location: [facility] ORS;  Service: General;  Laterality: Right;  Right Excisional Breast Biopsy  . FISTULOGRAM Right 03/26/2014   Procedure: Right Arm Fistulogram with Venoplasty Right Subclavian Vein and Inominate Vein. Debridement Fistula Ulcer;  Surgeon: Conrad Cundiyo, MD;  Location: [place];  Service: Vascular;  Laterality: Right;  . SHUNTOGRAM Right 10/19/2013   Procedure: FISTULOGRAM;  Surgeon: Conrad Aledo, MD;  Location: [facility] CATH LAB;  Service: Cardiovascular;  Laterality: Right;  . TONSILLECTOMY      OB  History    Gravida Para Term Preterm AB Living   1 1 1     1    SAB TAB Ectopic Multiple Live Births                   Home Medications    Prior to Admission medications   Medication Sig Start Date End Date Taking? Authorizing Provider  buPROPion (WELLBUTRIN XL) 150 MG 24 hr tablet Take 150 mg by mouth daily.    Historical Provider, MD  diltiazem (CARDIZEM) 60 MG tablet Take 60 mg by mouth every 8 (eight) hours.    Historical Provider, MD  epoetin alfa (EPOGEN,PROCRIT) 00867 UNIT/ML  injection Inject 8,000 Units into the skin 3 (three) times a week. To be given at dialysis, Monday Wednesday and Friday    Historical Provider, MD  fentaNYL (DURAGESIC - DOSED MCG/HR) 50 MCG/HR Apply one patch every 72 hours for pain. Remove old patch. External use only. Rotate sites. 02/28/15   Lauree Chandler, NP  hydroxypropyl methylcellulose (ISOPTO TEARS) 2.5 % ophthalmic solution Place 1 drop into both eyes 2 (two) times daily.      Historical Provider, MD  lamiVUDine (EPIVIR) 10 MG/ML solution Take 2.5 mLs (25 mg total) by mouth daily. 03/29/14   Virginia Crews, MD  lidocaine-prilocaine (EMLA) cream Apply 1 application topically 3 (three) times a week. Apply 1 hour prior to dialysis access site on Monday Wednesday and Friday    Historical Provider, MD  LORazepam (ATIVAN) 0.5 MG tablet Take 0.5 tablets (0.25 mg total) by mouth every 6 (six) hours as needed for anxiety. 02/28/15   Lauree Chandler, NP  metoprolol (LOPRESSOR) 50 MG tablet Take 50 mg by mouth 2 (two) times daily.    Historical Provider, MD  multivitamin (RENA-VIT) TABS tablet Take 1 tablet by mouth at bedtime.    Historical Provider, MD  omeprazole (PRILOSEC) 40 MG capsule Take 40 mg by mouth daily.  02/15/14   Historical Provider, MD  ondansetron (ZOFRAN) 4 MG tablet Take 2 mg by mouth 3 (three) times daily. nausea    Historical Provider, MD  oxyCODONE (OXY IR/ROXICODONE) 5 MG immediate release tablet Take one tablet by mouth every 6 hours as needed for moderate pain 02/26/15   Lauree Chandler, NP  Oxycodone HCl 10 MG TABS Take 10 mg by mouth at bedtime.    Historical Provider, MD  raltegravir (ISENTRESS) 400 MG tablet Take 400 mg by mouth 2 (two) times daily.     Historical Provider, MD  sertraline (ZOLOFT) 100 MG tablet Take 100 mg by mouth daily.    Historical Provider, MD  sevelamer carbonate (RENVELA) 800 MG tablet Take 1,600 mg by mouth 3 (three) times daily with meals.     Historical Provider, MD  sildenafil  (REVATIO) 20 MG tablet Take 20 mg by mouth 3 (three) times daily.     Historical Provider, MD  sulfamethoxazole-trimethoprim (BACTRIM,SEPTRA) 400-80 MG per tablet Take 1 tablet by mouth 3 (three) times a week. 03/29/14   Virginia Crews, MD  vitamin C (ASCORBIC ACID) 500 MG tablet Take 500 mg by mouth 2 (two) times daily.    Historical Provider, MD  zaleplon (SONATA) 5 MG capsule Take one capsule by mouth every night at bedtime for rest 02/19/15   Lauree Chandler, NP  zidovudine (RETROVIR) 100 MG capsule Take 100 mg by mouth 3 (three) times daily.      Historical Provider, MD    Family History Family History  Problem Relation Age of Onset  . Anesthesia problems Neg Hx   . Hypotension Neg Hx   . Malignant hyperthermia Neg Hx   . Pseudochol deficiency Neg Hx     Social History Social History  Substance Use Topics  . Smoking status: Former Smoker    Quit date: 03/12/1983  . Smokeless tobacco: Never Used  . Alcohol use No     Allergies   Latex and Penicillins   Review of Systems Review of Systems  Constitutional: Negative for fever.  Respiratory: Negative for shortness of breath.   Cardiovascular: Negative for chest pain and leg swelling.  Musculoskeletal: Positive for arthralgias and joint swelling.  Neurological: Positive for numbness (fingertips). Negative for weakness.  All other systems reviewed and are negative.    Physical Exam Updated Vital Signs BP 138/79 (BP Location: Left Leg)   Pulse 61   Temp 98.4 F (36.9 C) (Oral)   Resp 18   SpO2 99%   Physical Exam  Constitutional: She is oriented to person, place, and time. She appears well-developed and well-nourished. No distress.  HENT:  Head: Normocephalic and atraumatic.  Right Ear: External ear normal.  Left Ear: External ear normal.  Nose: Nose normal.  Eyes: Right eye exhibits no discharge. Left eye exhibits no discharge.  Cardiovascular: Normal rate, regular rhythm and normal heart sounds.     Pulses:      Radial pulses are 2+ on the right side.  Pulmonary/Chest: Effort normal and breath sounds normal.  Abdominal: Soft. There is no tenderness.  Musculoskeletal:  Right upper arm from shoulder to mid hand is diffusely and uniformly swollen. Generalized soreness. Right arm about 3x larger than left. Right AV fistula with thrill but feels diminished. Normal sensation. Normal movement, slightly limited by amount of swelling. No redness.  Neurological: She is alert and oriented to person, place, and time.  Skin: Skin is warm and dry. She is not diaphoretic.  Nursing note and vitals reviewed.    ED Treatments / Results  Labs (all labs ordered are listed, but only abnormal results are displayed) Labs Reviewed  BASIC METABOLIC PANEL  CBC WITH DIFFERENTIAL/PLATELET    EKG  EKG Interpretation None       Radiology No results found.  Procedures Procedures (including critical care time)  Medications Ordered in ED Medications  HYDROcodone-acetaminophen (NORCO/VICODIN) 5-325 MG per tablet 1 tablet (1 tablet Oral Given 04/16/16 1559)     Initial Impression / Assessment and Plan / ED Course  I have reviewed the triage vital signs and the nursing notes.  Pertinent labs & imaging results that were available during my care of the patient were reviewed by me and considered in my medical decision making (see chart for details).  Clinical Course as of Apr 16 1653  Thu Apr 16, 2016  1529 U/s r/o dvt and look at fistula  [SG]    Clinical Course User Index [SG] Sherwood Gambler, MD    Patient has symmetric swelling with good ROM considering the degree of swelling. NV intact. Has history of subclavian stenosis with prior procedures but most recent imaging showed this was patent. Stable. Care to Dr. Tomi Bamberger with u/s of extremity pending.  Final Clinical Impressions(s) / ED Diagnoses   Final diagnoses:  None    New Prescriptions New Prescriptions   No medications on file      Sherwood Gambler, MD 04/16/16 1754

## 2016-04-16 NOTE — ED Notes (Signed)
Pt continues to complain about pain in her left arm.

## 2016-04-16 NOTE — Plan of Care (Signed)
Problem: Pain Managment: Goal: General experience of comfort will improve Outcome: Progressing Pain is controlled with pain patch and PRN medication.

## 2016-04-16 NOTE — ED Triage Notes (Signed)
Patient presents to ed vi [facility] from [facility], she states her right arm has been swollen x 1 week. Goes to dialysis M-W-F, was seen by [facility] this am and per dialysis center patient has subclavian stenosis and they weren't able to access it. Patient states she was sent to dialysis and after 1 1/2 hour of dialysis it was too painful and her right arm became even more swollen. Patient was given Tylenol 500mg   C/o throbbing pain in right arm. Right radial pulse difficult to palpate

## 2016-04-17 ENCOUNTER — Encounter (HOSPITAL_COMMUNITY): Payer: Self-pay | Admitting: *Deleted

## 2016-04-17 ENCOUNTER — Encounter (HOSPITAL_COMMUNITY)
Admission: EM | Disposition: A | Discharge status: Home / Self Care | Payer: Self-pay | Source: Home / Self Care | Attending: Emergency Medicine

## 2016-04-17 DIAGNOSIS — D638 Anemia in other chronic diseases classified elsewhere: Secondary | ICD-10-CM | POA: Diagnosis not present

## 2016-04-17 DIAGNOSIS — T82858A Stenosis of vascular prosthetic devices, implants and grafts, initial encounter: Secondary | ICD-10-CM | POA: Diagnosis not present

## 2016-04-17 DIAGNOSIS — N186 End stage renal disease: Secondary | ICD-10-CM | POA: Diagnosis not present

## 2016-04-17 DIAGNOSIS — Z992 Dependence on renal dialysis: Secondary | ICD-10-CM | POA: Diagnosis not present

## 2016-04-17 DIAGNOSIS — B20 Human immunodeficiency virus [HIV] disease: Secondary | ICD-10-CM | POA: Diagnosis not present

## 2016-04-17 DIAGNOSIS — D539 Nutritional anemia, unspecified: Secondary | ICD-10-CM

## 2016-04-17 DIAGNOSIS — T82898A Other specified complication of vascular prosthetic devices, implants and grafts, initial encounter: Secondary | ICD-10-CM | POA: Diagnosis not present

## 2016-04-17 DIAGNOSIS — M7989 Other specified soft tissue disorders: Secondary | ICD-10-CM | POA: Diagnosis not present

## 2016-04-17 DIAGNOSIS — I10 Essential (primary) hypertension: Secondary | ICD-10-CM | POA: Diagnosis not present

## 2016-04-17 HISTORY — PX: DIALYSIS/PERMA CATHETER INSERTION: CATH118288

## 2016-04-17 HISTORY — PX: A/V FISTULAGRAM: CATH118298

## 2016-04-17 HISTORY — PX: PERIPHERAL VASCULAR BALLOON ANGIOPLASTY: CATH118281

## 2016-04-17 LAB — BASIC METABOLIC PANEL
Anion gap: 13 (ref 5–15)
BUN: 26 mg/dL — AB (ref 6–20)
CHLORIDE: 100 mmol/L — AB (ref 101–111)
CO2: 26 mmol/L (ref 22–32)
Calcium: 9.4 mg/dL (ref 8.9–10.3)
Creatinine, Ser: 7.84 mg/dL — ABNORMAL HIGH (ref 0.44–1.00)
GFR calc Af Amer: 6 mL/min — ABNORMAL LOW (ref >60)
GFR, EST NON AFRICAN AMERICAN: 5 mL/min — AB (ref >60)
Glucose, Bld: 105 mg/dL — ABNORMAL HIGH (ref 65–99)
POTASSIUM: 3.2 mmol/L — AB (ref 3.5–5.1)
SODIUM: 139 mmol/L (ref 135–145)

## 2016-04-17 LAB — CBC
HCT: 32.9 % — ABNORMAL LOW (ref 36.0–46.0)
HEMOGLOBIN: 10.2 g/dL — AB (ref 12.0–15.0)
MCH: 33 pg (ref 26.0–34.0)
MCHC: 31 g/dL (ref 30.0–36.0)
MCV: 106.5 fL — ABNORMAL HIGH (ref 78.0–100.0)
Platelets: 146 10*3/uL — ABNORMAL LOW (ref 150–400)
RBC: 3.09 MIL/uL — AB (ref 3.87–5.11)
RDW: 15.2 % (ref 11.5–15.5)
WBC: 2.8 10*3/uL — AB (ref 4.0–10.5)

## 2016-04-17 LAB — MRSA PCR SCREENING: MRSA BY PCR: NEGATIVE

## 2016-04-17 SURGERY — A/V FISTULAGRAM
Laterality: Right

## 2016-04-17 MED ORDER — MIDAZOLAM HCL 2 MG/2ML IJ SOLN
INTRAMUSCULAR | Status: DC | PRN
  Administered 2016-04-17 (×2): 1 mg via INTRAVENOUS

## 2016-04-17 MED ORDER — HEPARIN SODIUM (PORCINE) 1000 UNIT/ML IJ SOLN
INTRAMUSCULAR | Status: DC | PRN
  Administered 2016-04-17: 6000 [IU] via INTRAVENOUS
  Administered 2016-04-17: 3000 [IU] via INTRAVENOUS

## 2016-04-17 MED ORDER — HEPARIN (PORCINE) IN NACL 2-0.9 UNIT/ML-% IJ SOLN
INTRAMUSCULAR | Status: DC | PRN
  Administered 2016-04-17: 1000 mL via INTRA_ARTERIAL
  Administered 2016-04-17: 1500 mL

## 2016-04-17 MED ORDER — FENTANYL CITRATE (PF) 100 MCG/2ML IJ SOLN
INTRAMUSCULAR | Status: DC | PRN
  Administered 2016-04-17: 25 ug via INTRAVENOUS
  Administered 2016-04-17: 50 ug via INTRAVENOUS
  Administered 2016-04-17: 25 ug via INTRAVENOUS
  Administered 2016-04-17: 50 ug via INTRAVENOUS

## 2016-04-17 MED ORDER — DIPHENHYDRAMINE HCL 50 MG/ML IJ SOLN
INTRAMUSCULAR | Status: DC | PRN
  Administered 2016-04-17: 25 mg via INTRAVENOUS

## 2016-04-17 MED ORDER — HEPARIN (PORCINE) IN NACL 2-0.9 UNIT/ML-% IJ SOLN
INTRAMUSCULAR | Status: AC
  Filled 2016-04-17: qty 500

## 2016-04-17 MED ORDER — POLYVINYL ALCOHOL 1.4 % OP SOLN
1.0000 [drp] | Freq: Two times a day (BID) | OPHTHALMIC | Status: DC
  Administered 2016-04-17 – 2016-04-19 (×5): 1 [drp] via OPHTHALMIC
  Filled 2016-04-17: qty 15

## 2016-04-17 MED ORDER — DOLUTEGRAVIR SODIUM 50 MG PO TABS
50.0000 mg | ORAL_TABLET | Freq: Every day | ORAL | Status: DC
  Administered 2016-04-17 – 2016-04-19 (×3): 50 mg via ORAL
  Filled 2016-04-17 (×4): qty 1

## 2016-04-17 MED ORDER — ZIDOVUDINE 100 MG PO CAPS
100.0000 mg | ORAL_CAPSULE | Freq: Three times a day (TID) | ORAL | Status: DC
  Administered 2016-04-17 – 2016-04-19 (×5): 100 mg via ORAL
  Filled 2016-04-17 (×6): qty 1

## 2016-04-17 MED ORDER — MIDAZOLAM HCL 2 MG/2ML IJ SOLN
INTRAMUSCULAR | Status: AC
  Filled 2016-04-17: qty 2

## 2016-04-17 MED ORDER — LIDOCAINE HCL (PF) 1 % IJ SOLN
INTRAMUSCULAR | Status: AC
  Filled 2016-04-17: qty 30

## 2016-04-17 MED ORDER — LIDOCAINE HCL (PF) 1 % IJ SOLN
INTRAMUSCULAR | Status: DC | PRN
  Administered 2016-04-17: 12 mL

## 2016-04-17 MED ORDER — POTASSIUM CHLORIDE CRYS ER 20 MEQ PO TBCR
40.0000 meq | EXTENDED_RELEASE_TABLET | ORAL | Status: AC
  Administered 2016-04-17: 40 meq via ORAL
  Filled 2016-04-17: qty 2

## 2016-04-17 MED ORDER — FENTANYL CITRATE (PF) 100 MCG/2ML IJ SOLN
INTRAMUSCULAR | Status: AC
  Filled 2016-04-17: qty 2

## 2016-04-17 MED ORDER — DIPHENHYDRAMINE HCL 50 MG/ML IJ SOLN
INTRAMUSCULAR | Status: AC
  Filled 2016-04-17: qty 1

## 2016-04-17 MED ORDER — FENTANYL CITRATE (PF) 100 MCG/2ML IJ SOLN
INTRAMUSCULAR | Status: AC
Start: 2016-04-17 — End: 2016-04-17
  Filled 2016-04-17: qty 2

## 2016-04-17 SURGICAL SUPPLY — 39 items
BAG SNAP BAND KOVER 36X36 (MISCELLANEOUS) ×3 IMPLANT
BALLN ARMADA 10X60X80 (BALLOONS) ×3
BALLN ARMADA 14X60X80 (BALLOONS) ×3
BALLN MUSTANG 12X60X75 (BALLOONS) ×3
BALLN MUSTANG 6X80X75 (BALLOONS) ×3
BALLN MUSTANG 8X80X75 (BALLOONS) ×3
BALLOON ARMADA 10X60X80 (BALLOONS) ×2 IMPLANT
BALLOON ARMADA 14X60X80 (BALLOONS) ×2 IMPLANT
BALLOON MUSTANG 12X60X75 (BALLOONS) ×2 IMPLANT
BALLOON MUSTANG 6X80X75 (BALLOONS) ×2 IMPLANT
BALLOON MUSTANG 8X80X75 (BALLOONS) ×2 IMPLANT
CATH ANGIO 5F BER2 65CM (CATHETERS) ×6 IMPLANT
CATH CXI 4F 150 ST (CATHETERS) ×3 IMPLANT
CATH PALINDROME RT-P 15FX23CM (CATHETERS) ×3 IMPLANT
CATH QUICKCROSS SUPP .018X90CM (MICROCATHETER) ×3 IMPLANT
CATH QUICKCROSS SUPP .035X90CM (MICROCATHETER) ×3 IMPLANT
COVER DOME SNAP 22 D (MISCELLANEOUS) ×3 IMPLANT
COVER PRB 48X5XTLSCP FOLD TPE (BAG) ×2 IMPLANT
COVER PROBE 5X48 (BAG) ×1
DEVICE ONE SNARE 10MM (MISCELLANEOUS) ×6 IMPLANT
DEVICE TORQUE .014-.018 (MISCELLANEOUS) ×2 IMPLANT
DEVICE TORQUE .025-.038 (MISCELLANEOUS) ×3 IMPLANT
GUIDEWIRE ANGLED .035X150CM (WIRE) ×3 IMPLANT
GUIDEWIRE STR TIP .014X300X8 (WIRE) ×3 IMPLANT
KIT ENCORE 26 ADVANTAGE (KITS) ×3 IMPLANT
PROTECTION STATION PRESSURIZED (MISCELLANEOUS) ×3
SHEATH BRITE TIP 7FR 35CM (SHEATH) ×3 IMPLANT
SHEATH FLEX ANSEL ANG 5F 45CM (SHEATH) ×3 IMPLANT
SHEATH HIGHFLEX ANSEL 6FRX55 (SHEATH) ×3 IMPLANT
SHEATH PINNACLE 5F 10CM (SHEATH) ×6 IMPLANT
STATION PROTECTION PRESSURIZED (MISCELLANEOUS) ×2 IMPLANT
STOPCOCK MORSE 400PSI 3WAY (MISCELLANEOUS) ×3 IMPLANT
TORQUE DEVICE .014-.018 (MISCELLANEOUS) ×3
TRAY PV CATH (CUSTOM PROCEDURE TRAY) ×3 IMPLANT
TUBING CIL FLEX 10 FLL-RA (TUBING) ×3 IMPLANT
WIRE ASAHI CONFIANZPRO12 300CM (WIRE) ×3 IMPLANT
WIRE BENTSON .035X145CM (WIRE) ×3 IMPLANT
WIRE MINI STICK MAX (SHEATH) ×3 IMPLANT
WIRE ROSEN-J .035X260CM (WIRE) ×3 IMPLANT

## 2016-04-17 NOTE — Op Note (Signed)
Patient name: [name] MRN: [id number] DOB: 1950-10-31 Sex: female  04/17/2016 Pre-operative Diagnosis: esrd, central venous occlusion Post-operative diagnosis:  Same Surgeon:  Eda Paschal. Donzetta Matters, MD Procedure Performed: 1.  US guided cannulation of right arm av fisula 2.  US guided cannulation of right common femoral vein 3.  Right arm shuntogram and central venogram 4.  Balloon angioplasty of R subclavian and innominate veins with 6, 8, 10, 12 and 61mm balloons 5.  Placement of Right femoral tunneled dialysis catheter  Indications:  66 year old female with history of end-stage renal disease on dialysis via right radiocephalic AV fistula. 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 She now has no functioning dialysis access and is indicated for the above procedure.  Findings: Patient had occluded right subclavian vein with stenosis of her right innominate vein and multiple collaterals in the right upper extremity. At completion we had in-line flow from the arm filling the right heart with residual stenosis at the subclavian vein. A right femoral tunneled dialysis catheter was placed terminating in the right atrium.    Procedure:  The patient was identified in the holding area and taken to room 8.  She was sterilely prepped and the right arm right groin in the usual fashion timeout called. We began with ultrasound-guided access of her right cephalic vein in the upper arm. This was done with micropuncture needle wire placed followed by micropuncture sheath. Right upper extremity angiogram was performed demonstrating the above findings. With this we then exchanged over [facility] wire for long 5 French sheath into the right subclavian vein. We attempted to cross with bare catheter and Glidewire but could not. We then turned our attention to the right groin where  the common femoral vein was cannulated with 18-gauge needle and we placed a 5 French sheath. A bare catheter was used to get access into the SVC and we then placed a long 6 French sheath up into the SVC. We tented to cross from the groin but were also unable to. We then used a weighted confianza wire with 014 catheter from the right arm to attempt to cross the occlusion. We were unable to do so and we therefore turned to the wire backwards and were able to get access into the right IJ. We confirmed this with venogram. We then exchanged for V 14 wire which tracked into the right IJ. From the right groin we then exchanged over wire for a long catheter and through that were able to slowly 14 wire and body floss technique. The patient was given 3000 units of heparin. We then used a long exchange catheter to exchanged for [employer] wire as our body floss wire. We then serially dilated the occlusion with 6, 8, 10, 12 and 43mm high pressure balloons.  Completion venogram demonstrated residual stenosis would likely require. We did inflate the 14 mm balloon for extended time up to 3 minutes at high pressure. We still had residual stenosis. With this we elected to place a tunnel catheter in the right groin. 20 mL of lidocaine was instilled in the right side. A counterincision was made and we tunneled from that to the level of our sheath. The sheath was exchanged for an introducer sheath. A long tunnel catheter was then tunneled between the 2 incisions trimmed to size and assembled. It was then placed through the peel-away exchange  her sheath up into the right atrium. It flushed easily and was locked with 3 mL of heparin in each port. The wire was then removed from the right arm and the groin incision closed with 4-0 Monocryl in catheter affixed with 3-0 nylon suture. The sheath in the right arm was then removed and site closed with 4-0 Monocryl. Patient tolerated the procedure well without immediate complication.       Andros Channing C. Donzetta Matters, MD [facility] Office: [phone] Pager: [phone]

## 2016-04-17 NOTE — Care Management Obs Status (Signed)
[place] NOTIFICATION   Patient Details  Name: [name] MRN: [id number] Date of Birth: 07-Aug-1950   Medicare Observation Status Notification Given:  Yes    Dawayne Patricia, RN 04/17/2016, 4:36 PM

## 2016-04-17 NOTE — Consult Note (Signed)
Hospital Consult    Reason for Consult:  Esrd, rue edema MRN #:  [id number]  History of Present Illness: This is a 66 y.o. female with end-stage renal disease and right upper extremity radiocephalic AV fistula that has been there for many years. She has progressive edema throughout this week and required a procedure yesterday we do not have the records. He says yesterday was in the upper arm as well as the groin. She was able to dialyze following now has progressive swelling and pain in the right arm. She is still feel a hand hand is well-perfused. She has never had this issue before. Her last records for procedure 2016.  Past Medical History:  Diagnosis Date  . Anemia   . Anxiety   . Atrial flutter ([place]) 02/22/2015  . C. difficile colitis 10/10/11  . Chronic diarrhea   . Depression   . ESRD on hemodialysis ([place])   . HIV (human immunodeficiency virus infection) ([place])   . Hypertension   . Insomnia   . Intracranial hemorrhage ([place])   . Muscle weakness (generalized)   . Pulmonary HTN   . Traumatic hematoma of right upper arm 02/22/2015    Past Surgical History:  Procedure Laterality Date  . BIOPSY THYROID    . Dialysis Shunts     previous one removed from left arm and now present one in right arm  . ESOPHAGOGASTRODUODENOSCOPY  12/15/2010   Procedure: ESOPHAGOGASTRODUODENOSCOPY (EGD);  Surgeon: Daneil Dolin, MD;  Location: [facility];  Service: Endoscopy;  Laterality: N/A;  . EXCISION OF BREAST BIOPSY Right 05/04/2012   Procedure: EXCISION OF BREAST BIOPSY;  Surgeon: Donato Heinz, MD;  Location: [facility] ORS;  Service: General;  Laterality: Right;  Right Excisional Breast Biopsy  . FISTULOGRAM Right 03/26/2014   Procedure: Right Arm Fistulogram with Venoplasty Right Subclavian Vein and Inominate Vein. Debridement Fistula Ulcer;  Surgeon: Conrad Mesa Verde, MD;  Location: [place];  Service: Vascular;  Laterality: Right;  . SHUNTOGRAM Right 10/19/2013   Procedure: FISTULOGRAM;  Surgeon: Conrad Richburg, MD;  Location: [facility] CATH LAB;  Service: Cardiovascular;  Laterality: Right;  . TONSILLECTOMY      Allergies  Allergen Reactions  . Latex Rash  . Penicillins Rash    Has patient had a PCN reaction causing immediate rash, facial/tongue/throat swelling, SOB or lightheadedness with hypotension: No Has patient had a PCN reaction causing severe rash involving mucus membranes or skin necrosis: No Has patient had a PCN reaction that required hospitalization No Has patient had a PCN reaction occurring within the last 10 years: No If all of the above answers are "NO", then may proceed with Cephalosporin use.      Prior to Admission medications   Medication Sig Start Date End Date Taking? Authorizing Provider  acetaminophen (TYLENOL) 325 MG tablet Take 650 mg by mouth every 4 (four) hours as needed for moderate pain.   Yes Historical Provider, MD  albuterol (PROVENTIL HFA;VENTOLIN HFA) 108 (90 Base) MCG/ACT inhaler Inhale 2 puffs into the lungs every 4 (four) hours as needed for wheezing or shortness of breath.   Yes Historical Provider, MD  alum & mag hydroxide-simeth (MAALOX/MYLANTA) 200-200-20 MG/5ML suspension Take 30 mLs by mouth every 2 (two) hours as needed for indigestion or heartburn.   Yes Historical Provider, MD  buPROPion (WELLBUTRIN XL) 150 MG 24 hr tablet Take 150 mg by mouth daily.   Yes Historical Provider, MD  cinacalcet (SENSIPAR) 30 MG tablet Take 30 mg by mouth  daily.   Yes Historical Provider, MD  cyclobenzaprine (FLEXERIL) 5 MG tablet Take 5-10 mg by mouth every 12 (twelve) hours as needed for muscle spasms.    Yes Historical Provider, MD  diltiazem (CARDIZEM) 60 MG tablet Take 60 mg by mouth every 8 (eight) hours.   Yes Historical Provider, MD  dolutegravir (TIVICAY) 50 MG tablet Take 50 mg by mouth daily.   Yes Historical Provider, MD  epoetin alfa (EPOGEN,PROCRIT) 70177 UNIT/ML injection Inject 8,000 Units into the skin 3 (three) times a week. To be given at dialysis,  Monday Wednesday and Friday   Yes Historical Provider, MD  fentaNYL (DURAGESIC - DOSED MCG/HR) 50 MCG/HR Apply one patch every 72 hours for pain. Remove old patch. External use only. Rotate sites. 02/28/15  Yes Lauree Chandler, NP  lamivudine (EPIVIR) 100 MG tablet Take 50 mg by mouth daily.   Yes Historical Provider, MD  lidocaine-prilocaine (EMLA) cream Apply 1 application topically 3 (three) times a week. Apply 1 hour prior to dialysis access site on Monday Wednesday and Friday   Yes Historical Provider, MD  LORazepam (ATIVAN) 0.5 MG tablet Take 0.5 tablets (0.25 mg total) by mouth every 6 (six) hours as needed for anxiety. Patient taking differently: Take 0.25 mg by mouth every 8 (eight) hours as needed for anxiety.  02/28/15  Yes Lauree Chandler, NP  Melatonin 3 MG TABS Take 3 mg by mouth at bedtime.   Yes Historical Provider, MD  metoprolol (LOPRESSOR) 50 MG tablet Take 50 mg by mouth 2 (two) times daily.   Yes Historical Provider, MD  multivitamin (RENA-VIT) TABS tablet Take 1 tablet by mouth at bedtime.   Yes Historical Provider, MD  oxyCODONE (OXY IR/ROXICODONE) 5 MG immediate release tablet Take one tablet by mouth every 6 hours as needed for moderate pain Patient taking differently: Take 10 mg by mouth every 12 (twelve) hours as needed for moderate pain.  02/26/15  Yes Lauree Chandler, NP  pantoprazole (PROTONIX) 40 MG tablet Take 40 mg by mouth daily.   Yes Historical Provider, MD  promethazine (PHENERGAN) 12.5 MG tablet Take 12.5 mg by mouth 3 (three) times a week.   Yes Historical Provider, MD  promethazine (PHENERGAN) 12.5 MG tablet Take 12.5 mg by mouth every 6 (six) hours as needed for nausea or vomiting.   Yes Historical Provider, MD  sertraline (ZOLOFT) 100 MG tablet Take 150 mg by mouth daily.    Yes Historical Provider, MD  sevelamer carbonate (RENVELA) 800 MG tablet Take 2,400 mg by mouth 3 (three) times daily with meals.    Yes Historical Provider, MD  sildenafil  (REVATIO) 20 MG tablet Take 20 mg by mouth 3 (three) times daily.    Yes Historical Provider, MD  sulfamethoxazole-trimethoprim (BACTRIM,SEPTRA) 400-80 MG per tablet Take 1 tablet by mouth 3 (three) times a week. 03/29/14  Yes Virginia Crews, MD  vitamin C (ASCORBIC ACID) 500 MG tablet Take 500 mg by mouth 2 (two) times daily.   Yes Historical Provider, MD  zaleplon (SONATA) 5 MG capsule Take one capsule by mouth every night at bedtime for rest Patient taking differently: Take 10 mg by mouth at bedtime.  02/19/15  Yes Lauree Chandler, NP  zidovudine (RETROVIR) 100 MG capsule Take 100 mg by mouth 3 (three) times daily.     Yes Historical Provider, MD    Social History   Social History  . Marital status: Widowed    Spouse name: N/A  . Number  of children: N/A  . Years of education: N/A   Occupational History  . Not on file.   Social History Main Topics  . Smoking status: Former Smoker    Quit date: 03/12/1983  . Smokeless tobacco: Never Used  . Alcohol use No  . Drug use: No  . Sexual activity: Not Currently   Other Topics Concern  . Not on file   Social History Narrative  . No narrative on file     Family History  Problem Relation Age of Onset  . Anesthesia problems Neg Hx   . Hypotension Neg Hx   . Malignant hyperthermia Neg Hx   . Pseudochol deficiency Neg Hx       Physical Examination  Vitals:   04/16/16 2207 04/17/16 0505  BP: 122/66 135/61  Pulse: 67 65  Resp: 20 20  Temp: 98.2 F (36.8 C) 98.3 F (36.8 C)   Body mass index is 20.59 kg/m.  General:  WDWN in NAD HENT: WNL, normocephalic Pulmonary: normal non-labored breathing, without Rales, rhonchi,  wheezing Cardiac: palpable right radial pulse, can feel av thrill Abdomen: soft, ntnd Skin: no breakdown Extremities: significant swelling of entire rue without pitting Neurologic: A&O X 3; Appropriate Affect ; SENSATION: normal; MOTOR FUNCTION:  moving all extremities equally. Speech is  fluent/normal   CBC    Component Value Date/Time   WBC 2.8 (L) 04/17/2016 0223   RBC 3.09 (L) 04/17/2016 0223   HGB 10.2 (L) 04/17/2016 0223   HCT 32.9 (L) 04/17/2016 0223   PLT 146 (L) 04/17/2016 0223   MCV 106.5 (H) 04/17/2016 0223   MCH 33.0 04/17/2016 0223   MCHC 31.0 04/17/2016 0223   RDW 15.2 04/17/2016 0223   LYMPHSABS 1.2 04/16/2016 1605   MONOABS 0.2 04/16/2016 1605   EOSABS 0.1 04/16/2016 1605   BASOSABS 0.0 04/16/2016 1605    BMET    Component Value Date/Time   NA 139 04/17/2016 0223   K 3.2 (L) 04/17/2016 0223   CL 100 (L) 04/17/2016 0223   CO2 26 04/17/2016 0223   GLUCOSE 105 (H) 04/17/2016 0223   BUN 26 (H) 04/17/2016 0223   CREATININE 7.84 (H) 04/17/2016 0223   CREATININE 7.12 (H) 07/23/2015 1652   CALCIUM 9.4 04/17/2016 0223   CALCIUM 9.0 03/12/2011 0527   GFRNONAA 5 (L) 04/17/2016 0223   GFRNONAA 5 (L) 02/12/2015 1000   GFRAA 6 (L) 04/17/2016 0223   GFRAA 5 (L) 02/12/2015 1000    COAGS: Lab Results  Component Value Date   INR 1.14 12/28/2011   INR 1.10 12/13/2010   INR 1.10 01/26/2010     Non-Invasive Vascular Imaging:   VASCULAR LAB PRELIMINARY  PRELIMINARY  PRELIMINARY  PRELIMINARY  Right upper extremity venous duplex completed.    Preliminary report:  Technically difficult due to extreme swelling and pain. No obvious evidence of DVT and fistula appears patent.  ASSESSMENT/PLAN: This is a 66 y.o. female with swelling of her right arm with avf that had failed procedure yesterday. Will re-attempt fistulogram today of right arm with possible central venous treatment. If unsuccessful will place tunneled dialysis catheter and she will possibly need ligation of avf for symptom control. NPO and will plan procedure today.  Brandon C. Donzetta Matters, MD [facility] Office: [phone] Pager: [phone]

## 2016-04-17 NOTE — Progress Notes (Deleted)
Patient HR dropped to 31. MD on call notified. EKG done.

## 2016-04-17 NOTE — Consult Note (Signed)
Pt admitted for nonfunctioning/painful AVF.  To have VVS eval today and Fistulgram.  Chart reviewed.   Is in observation status.  Tentative/possible plan for DC today  If so, pt can rec HD as outpt at her local HD unit tomorrow, she will need to call and make arrangement.  If patient requires overnight admission please notify us and we can reconsult for dilaysis tomorrow.   Labs are stable at current time.

## 2016-04-17 NOTE — Progress Notes (Signed)
[facility]  PROGRESS NOTE  [name] NKN:[id number] DOB: 23-May-1950 DOA: 04/16/2016 PCP: Cleda Mccreedy, MD   Brief HPI:   66 y.o. female with medical history significant of ESRD on HD (M/W/F), HIV, HTN, and chronic anemia; who presents with complaints of right arm swelling and pain.  Right arm swelling has progressively worsened over the last 2 weeks. She reports having a normal hemodialysis session on 3 days ago, but thereafter noted having significant pain in the right arm. Patient reports pain felt like a bee sting and was constant. Tried taking Tylenol without any significant relief of symptoms. Her hemodialysis session was canceled yesterday due to the right upper arm swelling. She reports going to see a surgeon this morning who performed some kind of procedure for which she was able to have dialysis this afternoon. However due to the swelling and pain was sent here for further evaluation. Patient also reports associated symptoms of change in color the tips of her fingers. Denies having any shortness of breath, abdominal pain, nausea, vomiting, diarrhea, or fever patient had right upper extremity venous duplex which showed no evidence of DVT and fistula appears patent.    Subjective   This morning patient continues to have right arm swelling.   Assessment/Plan:     1. Right arm swelling- patient seen by vascular surgery, plan for fistulogram today, with possible central venous treatment. If unsuccessful vascular surgery replace tunneled dialysis catheter for hemodialysis. 2. ESRD-patient is on hemodialysis, Monday Wednesday Friday. Nephrology recommends outpatient hemodialysis tomorrow if patient is discharged today. Otherwise they will see the patient in a.m. and will have to be consulted. 3. History of HIV- continue raltegravir, lamivudine, and zidovudine 4. Chronic anemia- stable 5. Pulmonary arterial hypertension- continue Revatio. 6. History of atrial flutter-  stable.CHA2DS2VASc score is 2. Patient was never started on anti-coagulation.    DVT prophylaxis: Heparin  Code Status: Full code  Family Communication: No family present at bedside   Disposition Plan: Pending evaluation by vascular surgery   Consultants:  Vascular surgery  Nephrology  Procedures:  None       Antibiotics:   Anti-infectives    Start     Dose/Rate Route Frequency Ordered Stop   04/17/16 1000  [MAR Hold]  lamiVUDine (EPIVIR) 10 MG/ML solution 25 mg     (MAR Hold since 04/17/16 1236)   25 mg Oral Daily 04/16/16 2158         Objective   Vitals:   04/16/16 2207 04/17/16 0505 04/17/16 1209 04/17/16 1240  BP: 122/66 135/61 118/74   Pulse: 67 65 74   Resp: 20 20 (!) 21   Temp: 98.2 F (36.8 C) 98.3 F (36.8 C) 98.3 F (36.8 C)   TempSrc: Oral Oral Oral   SpO2: 94% 98% 98% 100%  Weight: 51.1 kg (112 lb 9.6 oz)     Height: 5\' 2"  (1.575 m)      No intake or output data in the 24 hours ending 04/17/16 1241 Filed Weights   04/16/16 2207  Weight: 51.1 kg (112 lb 9.6 oz)     Physical Examination:  General exam: Appears calm and comfortable. Respiratory system: Clear to auscultation. Respiratory effort normal. Cardiovascular system:  RRR. No  murmurs, rubs, gallops. No pedal edema. GI system: Abdomen is nondistended, soft and nontender. No organomegaly.  Central nervous system. No focal neurological deficits. 5 x 5 power in all extremities. Skin: No rashes, lesions or ulcers. Psychiatry: Alert, oriented x 3.Judgement and insight appear normal.  Affect normal.    Data Reviewed: I have personally reviewed following labs and imaging studies  CBG: No results for input(s): GLUCAP in the last 168 hours.  CBC:  Recent Labs Lab 04/16/16 1605 04/17/16 0223  WBC 3.0* 2.8*  NEUTROABS 1.5*  --   HGB 11.3* 10.2*  HCT 35.8* 32.9*  MCV 107.2* 106.5*  PLT 137* 146*    Basic Metabolic Panel:  Recent Labs Lab 04/16/16 1605 04/17/16 0223   NA 138 139  K 3.7 3.2*  CL 97* 100*  CO2 26 26  GLUCOSE 70 105*  BUN 20 26*  CREATININE 6.67* 7.84*  CALCIUM 9.3 9.4    Recent Results (from the past 240 hour(s))  MRSA PCR Screening     Status: None   Collection Time: 04/16/16 10:33 PM  Result Value Ref Range Status   MRSA by PCR NEGATIVE NEGATIVE Final    Comment:        The GeneXpert MRSA Assay (FDA approved for NASAL specimens only), is one component of a comprehensive MRSA colonization surveillance program. It is not intended to diagnose MRSA infection nor to guide or monitor treatment for MRSA infections.      Liver Function Tests: No results for input(s): AST, ALT, ALKPHOS, BILITOT, PROT, ALBUMIN in the last 168 hours. No results for input(s): LIPASE, AMYLASE in the last 168 hours. No results for input(s): AMMONIA in the last 168 hours.  Cardiac Enzymes: No results for input(s): CKTOTAL, CKMB, CKMBINDEX, TROPONINI in the last 168 hours. BNP (last 3 results) No results for input(s): BNP in the last 8760 hours.  ProBNP (last 3 results) No results for input(s): PROBNP in the last 8760 hours.    Studies: No results found.  Scheduled Meds: . [MAR Hold] heparin  5,000 Units Subcutaneous Q8H  . [MAR Hold] lamiVUDine  25 mg Oral Daily  . [MAR Hold] multivitamin  1 tablet Oral QHS  . [MAR Hold] pantoprazole  40 mg Oral Daily  . [MAR Hold] polyvinyl alcohol  1 drop Both Eyes BID  . [MAR Hold] sevelamer carbonate  1,600 mg Oral TID WC  . [MAR Hold] sildenafil  20 mg Oral TID  . [MAR Hold] vitamin C  500 mg Oral BID      Time spent: 25 min  [facility] Pager [phone]. If 7PM-7AM, please contact night-coverage at [email or web address], Office  [phone]  password [email or web address] 04/17/2016, 12:41 PM  LOS: 0 days

## 2016-04-18 DIAGNOSIS — M7989 Other specified soft tissue disorders: Secondary | ICD-10-CM | POA: Diagnosis not present

## 2016-04-18 DIAGNOSIS — T82858A Stenosis of vascular prosthetic devices, implants and grafts, initial encounter: Secondary | ICD-10-CM | POA: Diagnosis not present

## 2016-04-18 MED ORDER — OXYCODONE-ACETAMINOPHEN 5-325 MG PO TABS
ORAL_TABLET | ORAL | Status: AC
  Filled 2016-04-18: qty 1

## 2016-04-18 MED ORDER — POTASSIUM CHLORIDE 20 MEQ PO PACK
20.0000 meq | PACK | Freq: Once | ORAL | Status: AC
  Administered 2016-04-18: 20 meq via ORAL
  Filled 2016-04-18: qty 1

## 2016-04-18 NOTE — Progress Notes (Signed)
   VASCULAR SURGERY ASSESSMENT & PLAN:  1 Day Post-Op s/p: Balloon angioplasty of right subclavian vein and innominate vein with up to a 14 mm balloon. Also placement of a right femoral tunneled dialysis catheter.  The patient states that her arm swelling is better and she still has an excellent thrill in her fistula. Therefore, at this point I would not recommend ligation of her fistula.  Okay for discharge from a vascular standpoint and we could follow this as an outpatient.  It may be best to use the right femoral tunneled dialysis catheter for dialysis until the patient has been seen in follow up by Dr. Donzetta Matters to assess the results of the venoplasty.  SUBJECTIVE: She states that her right arm swelling is better.  PHYSICAL EXAM: Vitals:   04/17/16 1800 04/17/16 1957 04/18/16 0446 04/18/16 0612  BP: (!) 147/69 133/73 (!) 138/52   Pulse:  76 69   Resp:  20 20   Temp:  97.8 F (36.6 C) 98.6 F (37 C)   TempSrc:  Oral Oral   SpO2:  99% 99%   Weight:    112 lb 4.8 oz (50.9 kg)  Height:       Good thrill and right forearm AV fistula.  The fistula is not pulsatile.  LABS: Lab Results  Component Value Date   WBC 2.8 (L) 04/17/2016   HGB 10.2 (L) 04/17/2016   HCT 32.9 (L) 04/17/2016   MCV 106.5 (H) 04/17/2016   PLT 146 (L) 04/17/2016   Lab Results  Component Value Date   CREATININE 7.84 (H) 04/17/2016   Lab Results  Component Value Date   INR 1.14 12/28/2011   Principal Problem:   Swelling of right upper extremity Active Problems:   Human immunodeficiency virus (HIV) disease ([place])   Essential hypertension   ESRD on dialysis ([place])   Anemia of chronic disease   Gae Gallop Beeper: [phone] 04/18/2016

## 2016-04-18 NOTE — Progress Notes (Signed)
[facility]  PROGRESS NOTE  MAHLI GLAHN EFE:[id number] DOB: February 08, 1951 DOA: 04/16/2016 PCP: Cleda Mccreedy, MD   Brief HPI:   66 y.o. female with medical history significant of ESRD on HD (M/W/F), HIV, HTN, and chronic anemia; who presents with complaints of right arm swelling and pain.  Right arm swelling has progressively worsened over the last 2 weeks. She reports having a normal hemodialysis session on 3 days ago, but thereafter noted having significant pain in the right arm. Patient reports pain felt like a bee sting and was constant. Tried taking Tylenol without any significant relief of symptoms. Her hemodialysis session was canceled yesterday due to the right upper arm swelling. She reports going to see a surgeon this morning who performed some kind of procedure for which she was able to have dialysis this afternoon. However due to the swelling and pain was sent here for further evaluation. Patient also reports associated symptoms of change in color the tips of her fingers. Denies having any shortness of breath, abdominal pain, nausea, vomiting, diarrhea, or fever patient had right upper extremity venous duplex which showed no evidence of DVT and fistula appears patent.   Subjective   This morning patient continues to have right arm swelling. She now has HD access in the right thigh . No shortness of breath or chest pain.   Assessment/Plan:   1. Right arm swelling- patient seen by vascular surgery, had bilateral angioplasty of right subclavian vein and innominate vein with up to 40 mm balloon. Also placement of a right femoral tunneled dialysis catheter.   - Discharge pending Nephrology recommendations as to HD here or discharge home to continue HD on outpt schedule- MWF.  2. ESRD-patient is on hemodialysis, Monday Wednesday Friday.  - Patient still here today 2/17, following nephrology recommendations 2/16 I re-consulted this morning   3. History of HIV- continue  raltegravir, lamivudine, and zidovudine 4. Chronic anemia- stable 5. Pulmonary arterial hypertension- continue Revatio. 6. History of atrial flutter- stable.CHA2DS2VASc score is 2. Patient was never started on anti-coagulation.  DVT prophylaxis: Heparin  Code Status: Full code  Family Communication: No family present at bedside   Disposition Plan: Pending nephrology evaluation for  Next HD, here or as outpt.  Consultants:  Vascular surgery  Nephrology  Procedures:  None   Antibiotics:   Anti-infectives    Start     Dose/Rate Route Frequency Ordered Stop   04/17/16 2200  zidovudine (RETROVIR) capsule 100 mg     100 mg Oral Every 8 hours 04/17/16 1653     04/17/16 1800  dolutegravir (TIVICAY) tablet 50 mg     50 mg Oral Daily 04/17/16 1653     04/17/16 1000  lamiVUDine (EPIVIR) 10 MG/ML solution 25 mg     25 mg Oral Daily 04/16/16 2158        Objective   Vitals:   04/18/16 1630 04/18/16 1700 04/18/16 1730 04/18/16 1800  BP: (!) 144/77 137/70 (!) 143/71 128/69  Pulse: 82 86 88 92  Resp:      Temp:      TempSrc:      SpO2:      Weight:      Height:        Intake/Output Summary (Last 24 hours) at 04/18/16 1833 Last data filed at 04/18/16 1419  Gross per 24 hour  Intake              420 ml  Output  0 ml  Net              420 ml   Filed Weights   04/16/16 2207 04/18/16 0612 04/18/16 1500  Weight: 51.1 kg (112 lb 9.6 oz) 50.9 kg (112 lb 4.8 oz) 51.1 kg (112 lb 10.5 oz)    Physical Examination:  General exam: Appears calm and comfortable. Respiratory system: Clear to auscultation. Respiratory effort normal. Cardiovascular system:  RRR. No  murmurs, rubs, gallops. No pedal edema. GI system: Abdomen is nondistended, soft and nontender. No organomegaly.  Central nervous system. No focal neurological deficits. 5 x 5 power in all extremities. Skin: No rashes, lesions or ulcers. Psychiatry: Alert, oriented x 3.Judgement and insight appear normal.  Affect normal. Extremities- RUE- swelling extending to fingers. With dressing present from palm to upper arm. Also new right thigh HD access present, with dressing, minimal bloody drainage on dressing.  Data Reviewed: I have personally reviewed following labs and imaging studies  CBC:  Recent Labs Lab 04/16/16 1605 04/17/16 0223  WBC 3.0* 2.8*  NEUTROABS 1.5*  --   HGB 11.3* 10.2*  HCT 35.8* 32.9*  MCV 107.2* 106.5*  PLT 137* 146*    Basic Metabolic Panel:  Recent Labs Lab 04/16/16 1605 04/17/16 0223  NA 138 139  K 3.7 3.2*  CL 97* 100*  CO2 26 26  GLUCOSE 70 105*  BUN 20 26*  CREATININE 6.67* 7.84*  CALCIUM 9.3 9.4    Recent Results (from the past 240 hour(s))  MRSA PCR Screening     Status: None   Collection Time: 04/16/16 10:33 PM  Result Value Ref Range Status   MRSA by PCR NEGATIVE NEGATIVE Final    Comment:        The GeneXpert MRSA Assay (FDA approved for NASAL specimens only), is one component of a comprehensive MRSA colonization surveillance program. It is not intended to diagnose MRSA infection nor to guide or monitor treatment for MRSA infections.     Scheduled Meds: . dolutegravir  50 mg Oral Daily  . heparin  5,000 Units Subcutaneous Q8H  . lamiVUDine  25 mg Oral Daily  . multivitamin  1 tablet Oral QHS  . oxyCODONE-acetaminophen      . pantoprazole  40 mg Oral Daily  . polyvinyl alcohol  1 drop Both Eyes BID  . sevelamer carbonate  1,600 mg Oral TID WC  . sildenafil  20 mg Oral TID  . vitamin C  500 mg Oral BID  . zidovudine  100 mg Oral Q8H   Time spent: 25 min  Lavanya Roa E Mareli Antunes   [facility] Pager [phone]. If 7PM-7AM, please contact night-coverage at [email or web address], Office  [phone]  password [email or web address] 04/18/2016, 6:33 PM  LOS: 0 days

## 2016-04-19 DIAGNOSIS — M79609 Pain in unspecified limb: Secondary | ICD-10-CM | POA: Diagnosis not present

## 2016-04-19 DIAGNOSIS — T82858A Stenosis of vascular prosthetic devices, implants and grafts, initial encounter: Secondary | ICD-10-CM | POA: Diagnosis not present

## 2016-04-19 DIAGNOSIS — M7989 Other specified soft tissue disorders: Secondary | ICD-10-CM | POA: Diagnosis not present

## 2016-04-19 DIAGNOSIS — R531 Weakness: Secondary | ICD-10-CM | POA: Diagnosis not present

## 2016-04-19 NOTE — Clinical Social Work Note (Signed)
Clinical Social Worker facilitated patient discharge including contacting patient family (Dtr) and facility to confirm patient discharge plans Clinical information faxed to facility and family agreeable with plan.  CSW arranged ambulance transport via [facility] to [facility]. RN to call report [phone] prior to discharge.  Clinical Social Worker will sign off for now as social work intervention is no longer needed. Please consult [place] again if new need arises.  Etheleen Valtierra B. Joline Maxcy Clinical Social Work Dept Weekend Social Worker [phone] 1:41 PM

## 2016-04-19 NOTE — Progress Notes (Signed)
Discharged to home with family office visits in place teaching done  

## 2016-04-19 NOTE — Discharge Summary (Signed)
Physician Discharge Summary  [name] YWV:[id number] DOB: September 11, 1950 DOA: 04/16/2016  PCP: Cleda Mccreedy, MD  Admit date: 04/16/2016 Discharge date: 04/19/2016  Admitted From: SNF  Disposition:  SNF   Recommendations for Outpatient Follow-up:  1. Follow up with PCP in 1-2 weeks 2. Use Right femoral tunneled dialysis catheter for dialysis onto the patient has been seen in follow-up by Dr. Donzetta Matters to assess the results of the venoplasty. F/u in 2 weeks.  Home Health: No Equipment/Devices: No  Discharge Condition: Stable CODE STATUS: Full Diet recommendation: Renal diet  Brief HPI on admission:   66 y.o.femalewith medical history significant of ESRD on HD (M/W/F), HIV, HTN, and chronic anemia; who presents with complaints of right arm swelling and pain. Right arm swelling has progressively worsened over the last 2 weeks. She reports having a normal hemodialysis session on 3 days ago, but thereafter noted having significant pain in the right arm. Patient reports pain felt like a bee sting and was constant. Tried taking Tylenol without any significant relief of symptoms. Her hemodialysis session was canceled yesterday due to the right upper arm swelling. She reports going to see a surgeon this morning who performed some kind of procedure for which she was able to have dialysis this afternoon. However due to the swelling and pain was sent here for further evaluation. Patient also reports associated symptoms of change in color the tips of her fingers. Denies having any shortness of breath, abdominal pain, nausea, vomiting, diarrhea, or fever patient had right upper extremity venous duplex which showed no evidence of DVT and fistula appears patent.  Discharge Diagnoses:  Principal Problem:   Swelling of right upper extremity Active Problems:   Human immunodeficiency virus (HIV) disease ([place])   Essential hypertension   ESRD on dialysis ([place])   Anemia of chronic disease  Hospital course-    Right arm swelling- likely due to occluded right subclavian vein, with stenosis of right innominate vein. Patient seen by vascular surgery, pt had ballon angioplasty of right subclavian vein and innominate vein with up to 14 mm balloon. Also placement of a right femoral tunneled dialysis catheter- 04/17/16.  - Patient to follow-up with Dr. Donzetta Matters to assess the results of venoplasty. Recommendations by vascular surgery to use right femoral tunneled dialysis catheter for dialysis until the patient has been seen by Dr. Donzetta Matters. F/u in 2 weeks. - On discharge patients swelling has significantly improved  ESRD- nephrology consulted, patient had 1 session of dialysis to 04/18/2016 to continue outpatient regimen on discharge, Monday Wednesday Friday. Maalox has been held on discharge, but can be resumed, caution with end-stage renal disease status.  History of HIV- continue raltegravir, lamivudine, and zidovudine.  Chronic anemia- stable  Pulmonary arterial hypertension- continue Revatio.  History of atrial flutter- stable.CHA2DS2VASc score is 2. Patient was never started on anti-coagulation. Pt rate limiting meds- Metoprolol 50 mg twice a day, and Cardizem 60 mg 3 times a day, we held on admission, it rates well controlled. Will resume metoprolol 50 mg twice a day on discharge for rate control and high blood pressure. Can restart Cardizem at as tolerated or with increasing heart rates.   Discharge Instructions  Discharge Instructions    Diet - low sodium heart healthy    Complete by:  As directed    Increase activity slowly    Complete by:  As directed      Allergies as of 04/19/2016      Reactions   Latex Rash   Penicillins  Rash   Has patient had a PCN reaction causing immediate rash, facial/tongue/throat swelling, SOB or lightheadedness with hypotension: No Has patient had a PCN reaction causing severe rash involving mucus membranes or skin necrosis: No Has patient had a PCN reaction that  required hospitalization No Has patient had a PCN reaction occurring within the last 10 years: No If all of the above answers are "NO", then may proceed with Cephalosporin use.      Medication List    STOP taking these medications   alum & mag hydroxide-simeth 200-200-20 MG/5ML suspension Commonly known as:  MAALOX/MYLANTA   diltiazem 60 MG tablet Commonly known as:  CARDIZEM   sulfamethoxazole-trimethoprim 400-80 MG tablet Commonly known as:  BACTRIM,SEPTRA     TAKE these medications   acetaminophen 325 MG tablet Commonly known as:  TYLENOL Take 650 mg by mouth every 4 (four) hours as needed for moderate pain.   albuterol 108 (90 Base) MCG/ACT inhaler Commonly known as:  PROVENTIL HFA;VENTOLIN HFA Inhale 2 puffs into the lungs every 4 (four) hours as needed for wheezing or shortness of breath.   buPROPion 150 MG 24 hr tablet Commonly known as:  WELLBUTRIN XL Take 150 mg by mouth daily.   cinacalcet 30 MG tablet Commonly known as:  SENSIPAR Take 30 mg by mouth daily.   cyclobenzaprine 5 MG tablet Commonly known as:  FLEXERIL Take 5-10 mg by mouth every 12 (twelve) hours as needed for muscle spasms.   epoetin alfa 10000 UNIT/ML injection Commonly known as:  EPOGEN,PROCRIT Inject 8,000 Units into the skin 3 (three) times a week. To be given at dialysis, Monday Wednesday and Friday   fentaNYL 50 MCG/HR Commonly known as:  DURAGESIC - dosed mcg/hr Apply one patch every 72 hours for pain. Remove old patch. External use only. Rotate sites.   lamivudine 100 MG tablet Commonly known as:  EPIVIR Take 50 mg by mouth daily.   lidocaine-prilocaine cream Commonly known as:  EMLA Apply 1 application topically 3 (three) times a week. Apply 1 hour prior to dialysis access site on Monday Wednesday and Friday   LORazepam 0.5 MG tablet Commonly known as:  ATIVAN Take 0.5 tablets (0.25 mg total) by mouth every 6 (six) hours as needed for anxiety. What changed:  when to take  this   Melatonin 3 MG Tabs Take 3 mg by mouth at bedtime.   metoprolol 50 MG tablet Commonly known as:  LOPRESSOR Take 50 mg by mouth 2 (two) times daily.   multivitamin Tabs tablet Take 1 tablet by mouth at bedtime.   oxyCODONE 5 MG immediate release tablet Commonly known as:  Oxy IR/ROXICODONE Take one tablet by mouth every 6 hours as needed for moderate pain What changed:  how much to take  how to take this  when to take this  reasons to take this  additional instructions   pantoprazole 40 MG tablet Commonly known as:  PROTONIX Take 40 mg by mouth daily.   promethazine 12.5 MG tablet Commonly known as:  PHENERGAN Take 12.5 mg by mouth 3 (three) times a week.   promethazine 12.5 MG tablet Commonly known as:  PHENERGAN Take 12.5 mg by mouth every 6 (six) hours as needed for nausea or vomiting.   sertraline 100 MG tablet Commonly known as:  ZOLOFT Take 150 mg by mouth daily.   sevelamer carbonate 800 MG tablet Commonly known as:  RENVELA Take 2,400 mg by mouth 3 (three) times daily with meals.   sildenafil 20  MG tablet Commonly known as:  REVATIO Take 20 mg by mouth 3 (three) times daily.   TIVICAY 50 MG tablet Generic drug:  dolutegravir Take 50 mg by mouth daily.   vitamin C 500 MG tablet Commonly known as:  ASCORBIC ACID Take 500 mg by mouth 2 (two) times daily.   zaleplon 5 MG capsule Commonly known as:  SONATA Take one capsule by mouth every night at bedtime for rest What changed:  how much to take  how to take this  when to take this  additional instructions   zidovudine 100 MG capsule Commonly known as:  RETROVIR Take 100 mg by mouth 3 (three) times daily.      Follow-up Information    Servando Snare, MD In 2 weeks.   Specialties:  Vascular Surgery, Cardiology Why:  Office will call you to arrange your appt (sent) Contact information: [address] [place] [address] [phone]          Allergies  Allergen Reactions   . Latex Rash  . Penicillins Rash    Has patient had a PCN reaction causing immediate rash, facial/tongue/throat swelling, SOB or lightheadedness with hypotension: No Has patient had a PCN reaction causing severe rash involving mucus membranes or skin necrosis: No Has patient had a PCN reaction that required hospitalization No Has patient had a PCN reaction occurring within the last 10 years: No If all of the above answers are "NO", then may proceed with Cephalosporin use.      Consultations: - Nephrology - Vascular surgery  Procedures/Studies: BY [facility]- Dr,. Waynetta Sandy. 04/17/16. 1. US guided cannulation of right arm av fisula 2.  US guided cannulation of right common femoral vein 3.  Right arm shuntogram and central venogram 4.  Balloon angioplasty of R subclavian and innominate veins with 6, 8, 10, 12 and 72mm balloons 5.  Placement of Right femoral tunneled dialysis catheter  Subjective: No complaints today. Ready to go back to SNF.   Discharge Exam: Vitals:   04/18/16 2006 04/19/16 0530  BP: (!) 145/52 110/62  Pulse: 90 84  Resp: 18 18  Temp: 100.2 F (37.9 C) 98.3 F (36.8 C)   Vitals:   04/18/16 1800 04/18/16 1826 04/18/16 2006 04/19/16 0530  BP: 128/69 (!) 143/65 (!) 145/52 110/62  Pulse: 92 82 90 84  Resp:  18 18 18   Temp:  98.7 F (37.1 C) 100.2 F (37.9 C) 98.3 F (36.8 C)  TempSrc:  Oral Oral Oral  SpO2:  97% 100% 100%  Weight:  50 kg (110 lb 3.7 oz)    Height:        General: Pt is alert, awake, not in acute distress, eating lunch. Cardiovascular: S1/S2 +, no rubs, no gallops Respiratory: CTA bilaterally, no wheezing, no rhonchi Abdominal: Soft, NT, ND, bowel sounds + Extremities: RUE swelling has significantly reduced, clean dressing over the right lower extremity.   The results of significant diagnostics from this hospitalization (including imaging, microbiology, ancillary and laboratory) are listed below for reference.      Microbiology: Recent Results (from the past 240 hour(s))  MRSA PCR Screening     Status: None   Collection Time: 04/16/16 10:33 PM  Result Value Ref Range Status   MRSA by PCR NEGATIVE NEGATIVE Final    Comment:        The GeneXpert MRSA Assay (FDA approved for NASAL specimens only), is one component of a comprehensive MRSA colonization surveillance program. It is not intended to diagnose  MRSA infection nor to guide or monitor treatment for MRSA infections.      Labs: Basic Metabolic Panel:  Recent Labs Lab 04/16/16 1605 04/17/16 0223  NA 138 139  K 3.7 3.2*  CL 97* 100*  CO2 26 26  GLUCOSE 70 105*  BUN 20 26*  CREATININE 6.67* 7.84*  CALCIUM 9.3 9.4   CBC:  Recent Labs Lab 04/16/16 1605 04/17/16 0223  WBC 3.0* 2.8*  NEUTROABS 1.5*  --   HGB 11.3* 10.2*  HCT 35.8* 32.9*  MCV 107.2* 106.5*  PLT 137* 146*   Urinalysis    Component Value Date/Time   COLORURINE YELLOW 01/19/2009 1855   APPEARANCEUR CLEAR 01/19/2009 1855   LABSPEC 1.015 01/19/2009 1855   PHURINE 7.5 01/19/2009 1855   GLUCOSEU NEGATIVE 01/19/2009 1855   HGBUR SMALL (A) 01/19/2009 1855   BILIRUBINUR NEGATIVE 01/19/2009 1855   KETONESUR NEGATIVE 01/19/2009 1855   PROTEINUR 100 (A) 01/19/2009 1855   UROBILINOGEN 0.2 01/19/2009 1855   NITRITE NEGATIVE 01/19/2009 1855   LEUKOCYTESUR NEGATIVE 01/19/2009 1855   Sepsis Labs Invalid input(s): PROCALCITONIN,  WBC,  LACTICIDVEN Microbiology Recent Results (from the past 240 hour(s))  MRSA PCR Screening     Status: None   Collection Time: 04/16/16 10:33 PM  Result Value Ref Range Status   MRSA by PCR NEGATIVE NEGATIVE Final    Comment:        The GeneXpert MRSA Assay (FDA approved for NASAL specimens only), is one component of a comprehensive MRSA colonization surveillance program. It is not intended to diagnose MRSA infection nor to guide or monitor treatment for MRSA infections.      Time coordinating discharge: Over 30  minutes  SIGNED: Bethena Roys, MD  [facility] 04/19/2016, 12:38 PM Pager [phone]  If 7PM-7AM, please contact night-coverage [email or web address] Password [email or web address]

## 2016-04-19 NOTE — Progress Notes (Signed)
   VASCULAR SURGERY ASSESSMENT & PLAN:  2 Day Post-Op s/p: Balloon angioplasty of right subclavian vein and innominate vein with up to a 14 mm balloon. Also placement of a right femoral tunneled dialysis catheter.  Her arm swelling is better and she still has an excellent thrill in her fistula. Therefore, at this point I would not recommend ligation of her fistula.  Okay for discharge from a vascular standpoint and we could follow this as an outpatient.  It may be best to use the right femoral tunneled dialysis catheter for dialysis until the patient has been seen in follow up by Dr. Donzetta Matters to assess the results of the venoplasty.  SUBJECTIVE: Arm feels better  PHYSICAL EXAM: Vitals:   04/18/16 1800 04/18/16 1826 04/18/16 2006 04/19/16 0530  BP: 128/69 (!) 143/65 (!) 145/52 110/62  Pulse: 92 82 90 84  Resp:  18 18 18   Temp:  98.7 F (37.1 C) 100.2 F (37.9 C) 98.3 F (36.8 C)  TempSrc:  Oral Oral Oral  SpO2:  97% 100% 100%  Weight:  110 lb 3.7 oz (50 kg)    Height:       Swelling in right arm is improved.  Good thrill in right AVF  LABS: Lab Results  Component Value Date   WBC 2.8 (L) 04/17/2016   HGB 10.2 (L) 04/17/2016   HCT 32.9 (L) 04/17/2016   MCV 106.5 (H) 04/17/2016   PLT 146 (L) 04/17/2016   Lab Results  Component Value Date   CREATININE 7.84 (H) 04/17/2016   Lab Results  Component Value Date   INR 1.14 12/28/2011   Principal Problem:   Swelling of right upper extremity Active Problems:   Human immunodeficiency virus (HIV) disease ([place])   Essential hypertension   ESRD on dialysis ([place])   Anemia of chronic disease  Gae Gallop Beeper: [phone] 04/19/2016

## 2016-04-19 NOTE — Clinical Social Work Placement (Signed)
   CLINICAL SOCIAL WORK PLACEMENT  NOTE  Date:  04/19/2016  Patient Details  Name: [name] MRN: [id number] Date of Birth: February 19, 1951  Clinical Social Work is seeking post-discharge placement for this patient at the [place] level of care (*CSW will initial, date and re-position this form in  chart as items are completed):  Yes   Patient/family provided with [facility] Work Department's list of facilities offering this level of care within the geographic area requested by the patient (or if unable, by the patient's family).  Yes   Patient/family informed of their freedom to choose among providers that offer the needed level of care, that participate in Medicare, Medicaid or managed care program needed by the patient, have an available bed and are willing to accept the patient.  Yes   Patient/family informed of [facility]'s ownership interest in [facility] and [facility], as well as of the fact that they are under no obligation to receive care at these facilities.  PASRR submitted to EDS on       PASRR number received on       Existing PASRR number confirmed on 04/19/16     FL2 transmitted to all facilities in geographic area requested by pt/family on       FL2 transmitted to all facilities within larger geographic area on       Patient informed that his/her managed care company has contracts with or will negotiate with certain facilities, including the following:        Yes   Patient/family informed of bed offers received.  Patient chooses bed at  (Pt from [facility])     Physician recommends and patient chooses bed at      Patient to be transferred to  (Pt from [facility]) on 04/19/16.  Patient to be transferred to facility by  Corey Harold)     Patient family notified on 04/19/16 of transfer.  Name of family member notified:  Dtr     PHYSICIAN       Additional Comment:     _______________________________________________ Serafina Mitchell, [place] 04/19/2016, 1:43 PM

## 2016-04-20 ENCOUNTER — Encounter (HOSPITAL_COMMUNITY): Payer: Self-pay | Admitting: Vascular Surgery

## 2016-04-20 ENCOUNTER — Telehealth: Payer: Self-pay | Admitting: Vascular Surgery

## 2016-04-20 DIAGNOSIS — N2581 Secondary hyperparathyroidism of renal origin: Secondary | ICD-10-CM | POA: Diagnosis not present

## 2016-04-20 DIAGNOSIS — E611 Iron deficiency: Secondary | ICD-10-CM | POA: Diagnosis not present

## 2016-04-20 DIAGNOSIS — Z992 Dependence on renal dialysis: Secondary | ICD-10-CM | POA: Diagnosis not present

## 2016-04-20 DIAGNOSIS — D472 Monoclonal gammopathy: Secondary | ICD-10-CM | POA: Diagnosis not present

## 2016-04-20 DIAGNOSIS — N186 End stage renal disease: Secondary | ICD-10-CM | POA: Diagnosis not present

## 2016-04-20 NOTE — Telephone Encounter (Signed)
-----   Message from Mena Goes, RN sent at 04/18/2016 12:42 PM EST ----- Regarding: schedule 2 weeks postop   ----- Message ----- From: Gabriel Earing, PA-C Sent: 04/18/2016   8:55 AM To: [facility] Charge Pool  S/p Balloon angioplasty of right subclavian vein and innominate vein with up to a 14 mm balloon. Also placement of a right femoral tunneled dialysis catheter.  F/u with Dr. Donzetta Matters in 2 weeks as pt may need further intervention.  Thanks, Aldona Bar

## 2016-04-20 NOTE — Telephone Encounter (Signed)
Unable to confirm post op appt via phone, mailed appt reminder.

## 2016-04-22 DIAGNOSIS — N2581 Secondary hyperparathyroidism of renal origin: Secondary | ICD-10-CM | POA: Diagnosis not present

## 2016-04-22 DIAGNOSIS — Z992 Dependence on renal dialysis: Secondary | ICD-10-CM | POA: Diagnosis not present

## 2016-04-22 DIAGNOSIS — D472 Monoclonal gammopathy: Secondary | ICD-10-CM | POA: Diagnosis not present

## 2016-04-22 DIAGNOSIS — N186 End stage renal disease: Secondary | ICD-10-CM | POA: Diagnosis not present

## 2016-04-22 DIAGNOSIS — E611 Iron deficiency: Secondary | ICD-10-CM | POA: Diagnosis not present

## 2016-04-23 DIAGNOSIS — G894 Chronic pain syndrome: Secondary | ICD-10-CM | POA: Diagnosis not present

## 2016-04-24 DIAGNOSIS — D472 Monoclonal gammopathy: Secondary | ICD-10-CM | POA: Diagnosis not present

## 2016-04-24 DIAGNOSIS — N186 End stage renal disease: Secondary | ICD-10-CM | POA: Diagnosis not present

## 2016-04-24 DIAGNOSIS — N2581 Secondary hyperparathyroidism of renal origin: Secondary | ICD-10-CM | POA: Diagnosis not present

## 2016-04-24 DIAGNOSIS — Z992 Dependence on renal dialysis: Secondary | ICD-10-CM | POA: Diagnosis not present

## 2016-04-24 DIAGNOSIS — E611 Iron deficiency: Secondary | ICD-10-CM | POA: Diagnosis not present

## 2016-04-27 DIAGNOSIS — F411 Generalized anxiety disorder: Secondary | ICD-10-CM | POA: Diagnosis not present

## 2016-04-27 DIAGNOSIS — D472 Monoclonal gammopathy: Secondary | ICD-10-CM | POA: Diagnosis not present

## 2016-04-27 DIAGNOSIS — F331 Major depressive disorder, recurrent, moderate: Secondary | ICD-10-CM | POA: Diagnosis not present

## 2016-04-27 DIAGNOSIS — Z992 Dependence on renal dialysis: Secondary | ICD-10-CM | POA: Diagnosis not present

## 2016-04-27 DIAGNOSIS — N2581 Secondary hyperparathyroidism of renal origin: Secondary | ICD-10-CM | POA: Diagnosis not present

## 2016-04-27 DIAGNOSIS — N186 End stage renal disease: Secondary | ICD-10-CM | POA: Diagnosis not present

## 2016-04-27 DIAGNOSIS — E611 Iron deficiency: Secondary | ICD-10-CM | POA: Diagnosis not present

## 2016-04-29 DIAGNOSIS — Z992 Dependence on renal dialysis: Secondary | ICD-10-CM | POA: Diagnosis not present

## 2016-04-29 DIAGNOSIS — E611 Iron deficiency: Secondary | ICD-10-CM | POA: Diagnosis not present

## 2016-04-29 DIAGNOSIS — N2581 Secondary hyperparathyroidism of renal origin: Secondary | ICD-10-CM | POA: Diagnosis not present

## 2016-04-29 DIAGNOSIS — N186 End stage renal disease: Secondary | ICD-10-CM | POA: Diagnosis not present

## 2016-04-29 DIAGNOSIS — D472 Monoclonal gammopathy: Secondary | ICD-10-CM | POA: Diagnosis not present

## 2016-04-30 DIAGNOSIS — D631 Anemia in chronic kidney disease: Secondary | ICD-10-CM | POA: Diagnosis not present

## 2016-04-30 DIAGNOSIS — N2581 Secondary hyperparathyroidism of renal origin: Secondary | ICD-10-CM | POA: Diagnosis not present

## 2016-04-30 DIAGNOSIS — N186 End stage renal disease: Secondary | ICD-10-CM | POA: Diagnosis not present

## 2016-04-30 DIAGNOSIS — E611 Iron deficiency: Secondary | ICD-10-CM | POA: Diagnosis not present

## 2016-04-30 DIAGNOSIS — Z992 Dependence on renal dialysis: Secondary | ICD-10-CM | POA: Diagnosis not present

## 2016-04-30 DIAGNOSIS — D472 Monoclonal gammopathy: Secondary | ICD-10-CM | POA: Diagnosis not present

## 2016-05-01 ENCOUNTER — Encounter: Payer: Self-pay | Admitting: Vascular Surgery

## 2016-05-01 DIAGNOSIS — D631 Anemia in chronic kidney disease: Secondary | ICD-10-CM | POA: Diagnosis not present

## 2016-05-01 DIAGNOSIS — Z992 Dependence on renal dialysis: Secondary | ICD-10-CM | POA: Diagnosis not present

## 2016-05-01 DIAGNOSIS — E611 Iron deficiency: Secondary | ICD-10-CM | POA: Diagnosis not present

## 2016-05-01 DIAGNOSIS — N186 End stage renal disease: Secondary | ICD-10-CM | POA: Diagnosis not present

## 2016-05-01 DIAGNOSIS — N2581 Secondary hyperparathyroidism of renal origin: Secondary | ICD-10-CM | POA: Diagnosis not present

## 2016-05-01 DIAGNOSIS — D472 Monoclonal gammopathy: Secondary | ICD-10-CM | POA: Diagnosis not present

## 2016-05-04 DIAGNOSIS — E611 Iron deficiency: Secondary | ICD-10-CM | POA: Diagnosis not present

## 2016-05-04 DIAGNOSIS — N186 End stage renal disease: Secondary | ICD-10-CM | POA: Diagnosis not present

## 2016-05-04 DIAGNOSIS — D472 Monoclonal gammopathy: Secondary | ICD-10-CM | POA: Diagnosis not present

## 2016-05-04 DIAGNOSIS — N2581 Secondary hyperparathyroidism of renal origin: Secondary | ICD-10-CM | POA: Diagnosis not present

## 2016-05-04 DIAGNOSIS — Z992 Dependence on renal dialysis: Secondary | ICD-10-CM | POA: Diagnosis not present

## 2016-05-04 DIAGNOSIS — D631 Anemia in chronic kidney disease: Secondary | ICD-10-CM | POA: Diagnosis not present

## 2016-05-06 DIAGNOSIS — D472 Monoclonal gammopathy: Secondary | ICD-10-CM | POA: Diagnosis not present

## 2016-05-06 DIAGNOSIS — E611 Iron deficiency: Secondary | ICD-10-CM | POA: Diagnosis not present

## 2016-05-06 DIAGNOSIS — N186 End stage renal disease: Secondary | ICD-10-CM | POA: Diagnosis not present

## 2016-05-06 DIAGNOSIS — N2581 Secondary hyperparathyroidism of renal origin: Secondary | ICD-10-CM | POA: Diagnosis not present

## 2016-05-06 DIAGNOSIS — D631 Anemia in chronic kidney disease: Secondary | ICD-10-CM | POA: Diagnosis not present

## 2016-05-06 DIAGNOSIS — Z992 Dependence on renal dialysis: Secondary | ICD-10-CM | POA: Diagnosis not present

## 2016-05-08 ENCOUNTER — Ambulatory Visit (INDEPENDENT_AMBULATORY_CARE_PROVIDER_SITE_OTHER): Payer: Self-pay | Admitting: Vascular Surgery

## 2016-05-08 ENCOUNTER — Encounter: Payer: Medicare Other | Admitting: Vascular Surgery

## 2016-05-08 ENCOUNTER — Encounter: Payer: Self-pay | Admitting: Vascular Surgery

## 2016-05-08 VITALS — BP 128/78 | HR 64 | Temp 97.9°F | Resp 18 | Ht 62.0 in | Wt 112.3 lb

## 2016-05-08 DIAGNOSIS — N2581 Secondary hyperparathyroidism of renal origin: Secondary | ICD-10-CM | POA: Diagnosis not present

## 2016-05-08 DIAGNOSIS — E611 Iron deficiency: Secondary | ICD-10-CM | POA: Diagnosis not present

## 2016-05-08 DIAGNOSIS — D631 Anemia in chronic kidney disease: Secondary | ICD-10-CM | POA: Diagnosis not present

## 2016-05-08 DIAGNOSIS — Z992 Dependence on renal dialysis: Secondary | ICD-10-CM

## 2016-05-08 DIAGNOSIS — N186 End stage renal disease: Secondary | ICD-10-CM

## 2016-05-08 DIAGNOSIS — D472 Monoclonal gammopathy: Secondary | ICD-10-CM | POA: Diagnosis not present

## 2016-05-08 NOTE — Progress Notes (Signed)
Subjective:     Patient ID: [name], female   DOB: 06-11-1950, 66 y.o.   MRN: [id number]  HPI [name] following up from balloon angioplasty of her right subclavian/innominate veins for an occlusion. At that time she had very severe swelling of her right upper extremity. That has subsequently resolved. At same time we placed a right femoral tunneled dialysis catheter which she has been using for dialysis.   Review of Systems No complaints today    Objective:   Physical Exam [id number] Non labored breathing Right arm with thrill in forearm No edema of rue Right femoral tdc in place with mild bruising, no erythema    Assessment/plan     [name] follows up from recent balloon angioplasty of her subclavian and innominate veins on the right for severe swelling of her right arm. She is now dialyzing through tunneled dialysis catheter. I released her today to use the right arm fistula and after it has been used 3 times we can remove the catheter. I would like to see her if she has further swelling in her arm, she can otherwise follow up on a when necessary basis.   Brandon C. Donzetta Matters, MD [facility] Office: [phone] Pager: [phone]

## 2016-05-12 DIAGNOSIS — N2581 Secondary hyperparathyroidism of renal origin: Secondary | ICD-10-CM | POA: Diagnosis not present

## 2016-05-12 DIAGNOSIS — N186 End stage renal disease: Secondary | ICD-10-CM | POA: Diagnosis not present

## 2016-05-12 DIAGNOSIS — M6281 Muscle weakness (generalized): Secondary | ICD-10-CM | POA: Diagnosis not present

## 2016-05-12 DIAGNOSIS — I4892 Unspecified atrial flutter: Secondary | ICD-10-CM | POA: Diagnosis not present

## 2016-05-12 DIAGNOSIS — K219 Gastro-esophageal reflux disease without esophagitis: Secondary | ICD-10-CM | POA: Diagnosis not present

## 2016-05-12 DIAGNOSIS — D631 Anemia in chronic kidney disease: Secondary | ICD-10-CM | POA: Diagnosis not present

## 2016-05-12 DIAGNOSIS — D472 Monoclonal gammopathy: Secondary | ICD-10-CM | POA: Diagnosis not present

## 2016-05-12 DIAGNOSIS — E611 Iron deficiency: Secondary | ICD-10-CM | POA: Diagnosis not present

## 2016-05-12 DIAGNOSIS — Z992 Dependence on renal dialysis: Secondary | ICD-10-CM | POA: Diagnosis not present

## 2016-05-13 DIAGNOSIS — N2581 Secondary hyperparathyroidism of renal origin: Secondary | ICD-10-CM | POA: Diagnosis not present

## 2016-05-13 DIAGNOSIS — D631 Anemia in chronic kidney disease: Secondary | ICD-10-CM | POA: Diagnosis not present

## 2016-05-13 DIAGNOSIS — N186 End stage renal disease: Secondary | ICD-10-CM | POA: Diagnosis not present

## 2016-05-13 DIAGNOSIS — E611 Iron deficiency: Secondary | ICD-10-CM | POA: Diagnosis not present

## 2016-05-13 DIAGNOSIS — D472 Monoclonal gammopathy: Secondary | ICD-10-CM | POA: Diagnosis not present

## 2016-05-13 DIAGNOSIS — D649 Anemia, unspecified: Secondary | ICD-10-CM | POA: Diagnosis not present

## 2016-05-13 DIAGNOSIS — Z992 Dependence on renal dialysis: Secondary | ICD-10-CM | POA: Diagnosis not present

## 2016-05-15 ENCOUNTER — Other Ambulatory Visit (HOSPITAL_COMMUNITY): Payer: Self-pay | Admitting: Medical

## 2016-05-15 ENCOUNTER — Other Ambulatory Visit (HOSPITAL_COMMUNITY): Payer: Self-pay | Admitting: Nephrology

## 2016-05-15 DIAGNOSIS — D472 Monoclonal gammopathy: Secondary | ICD-10-CM | POA: Diagnosis not present

## 2016-05-15 DIAGNOSIS — N2581 Secondary hyperparathyroidism of renal origin: Secondary | ICD-10-CM | POA: Diagnosis not present

## 2016-05-15 DIAGNOSIS — D631 Anemia in chronic kidney disease: Secondary | ICD-10-CM | POA: Diagnosis not present

## 2016-05-15 DIAGNOSIS — N186 End stage renal disease: Secondary | ICD-10-CM

## 2016-05-15 DIAGNOSIS — Z992 Dependence on renal dialysis: Secondary | ICD-10-CM | POA: Diagnosis not present

## 2016-05-15 DIAGNOSIS — E611 Iron deficiency: Secondary | ICD-10-CM | POA: Diagnosis not present

## 2016-05-18 DIAGNOSIS — N186 End stage renal disease: Secondary | ICD-10-CM | POA: Diagnosis not present

## 2016-05-18 DIAGNOSIS — Z992 Dependence on renal dialysis: Secondary | ICD-10-CM | POA: Diagnosis not present

## 2016-05-18 DIAGNOSIS — D472 Monoclonal gammopathy: Secondary | ICD-10-CM | POA: Diagnosis not present

## 2016-05-18 DIAGNOSIS — N2581 Secondary hyperparathyroidism of renal origin: Secondary | ICD-10-CM | POA: Diagnosis not present

## 2016-05-18 DIAGNOSIS — E611 Iron deficiency: Secondary | ICD-10-CM | POA: Diagnosis not present

## 2016-05-18 DIAGNOSIS — D631 Anemia in chronic kidney disease: Secondary | ICD-10-CM | POA: Diagnosis not present

## 2016-05-19 ENCOUNTER — Encounter (HOSPITAL_COMMUNITY): Payer: Self-pay | Admitting: Diagnostic Radiology

## 2016-05-19 ENCOUNTER — Ambulatory Visit (HOSPITAL_COMMUNITY)
Admission: RE | Admit: 2016-05-19 | Discharge: 2016-05-19 | Disposition: A | Discharge status: Home / Self Care | Payer: Medicare Other | Source: Ambulatory Visit | Attending: Medical | Admitting: Medical

## 2016-05-19 DIAGNOSIS — N186 End stage renal disease: Secondary | ICD-10-CM | POA: Diagnosis not present

## 2016-05-19 DIAGNOSIS — Z4901 Encounter for fitting and adjustment of extracorporeal dialysis catheter: Secondary | ICD-10-CM | POA: Diagnosis not present

## 2016-05-19 DIAGNOSIS — Z452 Encounter for adjustment and management of vascular access device: Secondary | ICD-10-CM | POA: Diagnosis not present

## 2016-05-19 DIAGNOSIS — Z992 Dependence on renal dialysis: Secondary | ICD-10-CM | POA: Diagnosis not present

## 2016-05-19 HISTORY — PX: IR GENERIC HISTORICAL: IMG1180011

## 2016-05-19 MED ORDER — LIDOCAINE HCL 1 % IJ SOLN
INTRAMUSCULAR | Status: DC | PRN
  Administered 2016-05-19: 10 mL

## 2016-05-19 MED ORDER — LIDOCAINE HCL (PF) 1 % IJ SOLN
INTRAMUSCULAR | Status: AC
  Filled 2016-05-19: qty 10

## 2016-05-19 MED ORDER — CHLORHEXIDINE GLUCONATE 4 % EX LIQD
CUTANEOUS | Status: AC
  Filled 2016-05-19: qty 15

## 2016-05-19 NOTE — Procedures (Signed)
Successful removal of right groin tunneled HD cath No EBL or complications Dressing placed

## 2016-05-22 DIAGNOSIS — D472 Monoclonal gammopathy: Secondary | ICD-10-CM | POA: Diagnosis not present

## 2016-05-22 DIAGNOSIS — E611 Iron deficiency: Secondary | ICD-10-CM | POA: Diagnosis not present

## 2016-05-22 DIAGNOSIS — D631 Anemia in chronic kidney disease: Secondary | ICD-10-CM | POA: Diagnosis not present

## 2016-05-22 DIAGNOSIS — N186 End stage renal disease: Secondary | ICD-10-CM | POA: Diagnosis not present

## 2016-05-22 DIAGNOSIS — Z992 Dependence on renal dialysis: Secondary | ICD-10-CM | POA: Diagnosis not present

## 2016-05-22 DIAGNOSIS — N2581 Secondary hyperparathyroidism of renal origin: Secondary | ICD-10-CM | POA: Diagnosis not present

## 2016-05-23 DIAGNOSIS — G894 Chronic pain syndrome: Secondary | ICD-10-CM | POA: Diagnosis not present

## 2016-05-25 DIAGNOSIS — N2581 Secondary hyperparathyroidism of renal origin: Secondary | ICD-10-CM | POA: Diagnosis not present

## 2016-05-25 DIAGNOSIS — Z992 Dependence on renal dialysis: Secondary | ICD-10-CM | POA: Diagnosis not present

## 2016-05-25 DIAGNOSIS — D631 Anemia in chronic kidney disease: Secondary | ICD-10-CM | POA: Diagnosis not present

## 2016-05-25 DIAGNOSIS — F331 Major depressive disorder, recurrent, moderate: Secondary | ICD-10-CM | POA: Diagnosis not present

## 2016-05-25 DIAGNOSIS — D472 Monoclonal gammopathy: Secondary | ICD-10-CM | POA: Diagnosis not present

## 2016-05-25 DIAGNOSIS — N186 End stage renal disease: Secondary | ICD-10-CM | POA: Diagnosis not present

## 2016-05-25 DIAGNOSIS — F5101 Primary insomnia: Secondary | ICD-10-CM | POA: Diagnosis not present

## 2016-05-25 DIAGNOSIS — E611 Iron deficiency: Secondary | ICD-10-CM | POA: Diagnosis not present

## 2016-05-25 DIAGNOSIS — F411 Generalized anxiety disorder: Secondary | ICD-10-CM | POA: Diagnosis not present

## 2016-05-27 DIAGNOSIS — D472 Monoclonal gammopathy: Secondary | ICD-10-CM | POA: Diagnosis not present

## 2016-05-27 DIAGNOSIS — E611 Iron deficiency: Secondary | ICD-10-CM | POA: Diagnosis not present

## 2016-05-27 DIAGNOSIS — Z992 Dependence on renal dialysis: Secondary | ICD-10-CM | POA: Diagnosis not present

## 2016-05-27 DIAGNOSIS — N186 End stage renal disease: Secondary | ICD-10-CM | POA: Diagnosis not present

## 2016-05-27 DIAGNOSIS — D631 Anemia in chronic kidney disease: Secondary | ICD-10-CM | POA: Diagnosis not present

## 2016-05-27 DIAGNOSIS — N2581 Secondary hyperparathyroidism of renal origin: Secondary | ICD-10-CM | POA: Diagnosis not present

## 2016-05-29 DIAGNOSIS — Z992 Dependence on renal dialysis: Secondary | ICD-10-CM | POA: Diagnosis not present

## 2016-05-29 DIAGNOSIS — D472 Monoclonal gammopathy: Secondary | ICD-10-CM | POA: Diagnosis not present

## 2016-05-29 DIAGNOSIS — N2581 Secondary hyperparathyroidism of renal origin: Secondary | ICD-10-CM | POA: Diagnosis not present

## 2016-05-29 DIAGNOSIS — D631 Anemia in chronic kidney disease: Secondary | ICD-10-CM | POA: Diagnosis not present

## 2016-05-29 DIAGNOSIS — N186 End stage renal disease: Secondary | ICD-10-CM | POA: Diagnosis not present

## 2016-05-29 DIAGNOSIS — E611 Iron deficiency: Secondary | ICD-10-CM | POA: Diagnosis not present

## 2016-05-30 DIAGNOSIS — N186 End stage renal disease: Secondary | ICD-10-CM | POA: Diagnosis not present

## 2016-05-30 DIAGNOSIS — Z992 Dependence on renal dialysis: Secondary | ICD-10-CM | POA: Diagnosis not present

## 2016-05-31 DIAGNOSIS — N186 End stage renal disease: Secondary | ICD-10-CM | POA: Diagnosis not present

## 2016-05-31 DIAGNOSIS — Z992 Dependence on renal dialysis: Secondary | ICD-10-CM | POA: Diagnosis not present

## 2016-05-31 DIAGNOSIS — D472 Monoclonal gammopathy: Secondary | ICD-10-CM | POA: Diagnosis not present

## 2016-05-31 DIAGNOSIS — N2581 Secondary hyperparathyroidism of renal origin: Secondary | ICD-10-CM | POA: Diagnosis not present

## 2016-05-31 DIAGNOSIS — D631 Anemia in chronic kidney disease: Secondary | ICD-10-CM | POA: Diagnosis not present

## 2016-05-31 DIAGNOSIS — D509 Iron deficiency anemia, unspecified: Secondary | ICD-10-CM | POA: Diagnosis not present

## 2016-06-01 DIAGNOSIS — N186 End stage renal disease: Secondary | ICD-10-CM | POA: Diagnosis not present

## 2016-06-01 DIAGNOSIS — D509 Iron deficiency anemia, unspecified: Secondary | ICD-10-CM | POA: Diagnosis not present

## 2016-06-01 DIAGNOSIS — N2581 Secondary hyperparathyroidism of renal origin: Secondary | ICD-10-CM | POA: Diagnosis not present

## 2016-06-01 DIAGNOSIS — D472 Monoclonal gammopathy: Secondary | ICD-10-CM | POA: Diagnosis not present

## 2016-06-01 DIAGNOSIS — Z992 Dependence on renal dialysis: Secondary | ICD-10-CM | POA: Diagnosis not present

## 2016-06-01 DIAGNOSIS — D631 Anemia in chronic kidney disease: Secondary | ICD-10-CM | POA: Diagnosis not present

## 2016-06-03 DIAGNOSIS — D631 Anemia in chronic kidney disease: Secondary | ICD-10-CM | POA: Diagnosis not present

## 2016-06-03 DIAGNOSIS — Z992 Dependence on renal dialysis: Secondary | ICD-10-CM | POA: Diagnosis not present

## 2016-06-03 DIAGNOSIS — D472 Monoclonal gammopathy: Secondary | ICD-10-CM | POA: Diagnosis not present

## 2016-06-03 DIAGNOSIS — D509 Iron deficiency anemia, unspecified: Secondary | ICD-10-CM | POA: Diagnosis not present

## 2016-06-03 DIAGNOSIS — N186 End stage renal disease: Secondary | ICD-10-CM | POA: Diagnosis not present

## 2016-06-03 DIAGNOSIS — N2581 Secondary hyperparathyroidism of renal origin: Secondary | ICD-10-CM | POA: Diagnosis not present

## 2016-06-05 DIAGNOSIS — Z992 Dependence on renal dialysis: Secondary | ICD-10-CM | POA: Diagnosis not present

## 2016-06-05 DIAGNOSIS — D472 Monoclonal gammopathy: Secondary | ICD-10-CM | POA: Diagnosis not present

## 2016-06-05 DIAGNOSIS — D509 Iron deficiency anemia, unspecified: Secondary | ICD-10-CM | POA: Diagnosis not present

## 2016-06-05 DIAGNOSIS — D631 Anemia in chronic kidney disease: Secondary | ICD-10-CM | POA: Diagnosis not present

## 2016-06-05 DIAGNOSIS — N186 End stage renal disease: Secondary | ICD-10-CM | POA: Diagnosis not present

## 2016-06-05 DIAGNOSIS — N2581 Secondary hyperparathyroidism of renal origin: Secondary | ICD-10-CM | POA: Diagnosis not present

## 2016-06-08 DIAGNOSIS — Z992 Dependence on renal dialysis: Secondary | ICD-10-CM | POA: Diagnosis not present

## 2016-06-08 DIAGNOSIS — N186 End stage renal disease: Secondary | ICD-10-CM | POA: Diagnosis not present

## 2016-06-08 DIAGNOSIS — D509 Iron deficiency anemia, unspecified: Secondary | ICD-10-CM | POA: Diagnosis not present

## 2016-06-08 DIAGNOSIS — D472 Monoclonal gammopathy: Secondary | ICD-10-CM | POA: Diagnosis not present

## 2016-06-08 DIAGNOSIS — D631 Anemia in chronic kidney disease: Secondary | ICD-10-CM | POA: Diagnosis not present

## 2016-06-08 DIAGNOSIS — N2581 Secondary hyperparathyroidism of renal origin: Secondary | ICD-10-CM | POA: Diagnosis not present

## 2016-06-10 DIAGNOSIS — Z992 Dependence on renal dialysis: Secondary | ICD-10-CM | POA: Diagnosis not present

## 2016-06-10 DIAGNOSIS — N2581 Secondary hyperparathyroidism of renal origin: Secondary | ICD-10-CM | POA: Diagnosis not present

## 2016-06-10 DIAGNOSIS — D631 Anemia in chronic kidney disease: Secondary | ICD-10-CM | POA: Diagnosis not present

## 2016-06-10 DIAGNOSIS — D472 Monoclonal gammopathy: Secondary | ICD-10-CM | POA: Diagnosis not present

## 2016-06-10 DIAGNOSIS — N186 End stage renal disease: Secondary | ICD-10-CM | POA: Diagnosis not present

## 2016-06-10 DIAGNOSIS — D509 Iron deficiency anemia, unspecified: Secondary | ICD-10-CM | POA: Diagnosis not present

## 2016-06-12 DIAGNOSIS — N2581 Secondary hyperparathyroidism of renal origin: Secondary | ICD-10-CM | POA: Diagnosis not present

## 2016-06-12 DIAGNOSIS — Z992 Dependence on renal dialysis: Secondary | ICD-10-CM | POA: Diagnosis not present

## 2016-06-12 DIAGNOSIS — D472 Monoclonal gammopathy: Secondary | ICD-10-CM | POA: Diagnosis not present

## 2016-06-12 DIAGNOSIS — D631 Anemia in chronic kidney disease: Secondary | ICD-10-CM | POA: Diagnosis not present

## 2016-06-12 DIAGNOSIS — N186 End stage renal disease: Secondary | ICD-10-CM | POA: Diagnosis not present

## 2016-06-12 DIAGNOSIS — D509 Iron deficiency anemia, unspecified: Secondary | ICD-10-CM | POA: Diagnosis not present

## 2016-06-15 DIAGNOSIS — Z992 Dependence on renal dialysis: Secondary | ICD-10-CM | POA: Diagnosis not present

## 2016-06-15 DIAGNOSIS — N186 End stage renal disease: Secondary | ICD-10-CM | POA: Diagnosis not present

## 2016-06-15 DIAGNOSIS — N2581 Secondary hyperparathyroidism of renal origin: Secondary | ICD-10-CM | POA: Diagnosis not present

## 2016-06-15 DIAGNOSIS — D472 Monoclonal gammopathy: Secondary | ICD-10-CM | POA: Diagnosis not present

## 2016-06-15 DIAGNOSIS — D509 Iron deficiency anemia, unspecified: Secondary | ICD-10-CM | POA: Diagnosis not present

## 2016-06-15 DIAGNOSIS — D631 Anemia in chronic kidney disease: Secondary | ICD-10-CM | POA: Diagnosis not present

## 2016-06-16 DIAGNOSIS — D509 Iron deficiency anemia, unspecified: Secondary | ICD-10-CM | POA: Diagnosis not present

## 2016-06-16 DIAGNOSIS — N186 End stage renal disease: Secondary | ICD-10-CM | POA: Diagnosis not present

## 2016-06-16 DIAGNOSIS — Z992 Dependence on renal dialysis: Secondary | ICD-10-CM | POA: Diagnosis not present

## 2016-06-16 DIAGNOSIS — N2581 Secondary hyperparathyroidism of renal origin: Secondary | ICD-10-CM | POA: Diagnosis not present

## 2016-06-16 DIAGNOSIS — D631 Anemia in chronic kidney disease: Secondary | ICD-10-CM | POA: Diagnosis not present

## 2016-06-16 DIAGNOSIS — D472 Monoclonal gammopathy: Secondary | ICD-10-CM | POA: Diagnosis not present

## 2016-06-17 DIAGNOSIS — N2581 Secondary hyperparathyroidism of renal origin: Secondary | ICD-10-CM | POA: Diagnosis not present

## 2016-06-17 DIAGNOSIS — N186 End stage renal disease: Secondary | ICD-10-CM | POA: Diagnosis not present

## 2016-06-17 DIAGNOSIS — D472 Monoclonal gammopathy: Secondary | ICD-10-CM | POA: Diagnosis not present

## 2016-06-17 DIAGNOSIS — D631 Anemia in chronic kidney disease: Secondary | ICD-10-CM | POA: Diagnosis not present

## 2016-06-17 DIAGNOSIS — D509 Iron deficiency anemia, unspecified: Secondary | ICD-10-CM | POA: Diagnosis not present

## 2016-06-17 DIAGNOSIS — Z992 Dependence on renal dialysis: Secondary | ICD-10-CM | POA: Diagnosis not present

## 2016-06-18 DIAGNOSIS — D509 Iron deficiency anemia, unspecified: Secondary | ICD-10-CM | POA: Diagnosis not present

## 2016-06-18 DIAGNOSIS — N186 End stage renal disease: Secondary | ICD-10-CM | POA: Diagnosis not present

## 2016-06-18 DIAGNOSIS — Z992 Dependence on renal dialysis: Secondary | ICD-10-CM | POA: Diagnosis not present

## 2016-06-18 DIAGNOSIS — N2581 Secondary hyperparathyroidism of renal origin: Secondary | ICD-10-CM | POA: Diagnosis not present

## 2016-06-18 DIAGNOSIS — D472 Monoclonal gammopathy: Secondary | ICD-10-CM | POA: Diagnosis not present

## 2016-06-18 DIAGNOSIS — D631 Anemia in chronic kidney disease: Secondary | ICD-10-CM | POA: Diagnosis not present

## 2016-06-18 DIAGNOSIS — D649 Anemia, unspecified: Secondary | ICD-10-CM | POA: Diagnosis not present

## 2016-06-19 DIAGNOSIS — I4892 Unspecified atrial flutter: Secondary | ICD-10-CM | POA: Diagnosis not present

## 2016-06-19 DIAGNOSIS — K219 Gastro-esophageal reflux disease without esophagitis: Secondary | ICD-10-CM | POA: Diagnosis not present

## 2016-06-19 DIAGNOSIS — N186 End stage renal disease: Secondary | ICD-10-CM | POA: Diagnosis not present

## 2016-06-19 DIAGNOSIS — M6281 Muscle weakness (generalized): Secondary | ICD-10-CM | POA: Diagnosis not present

## 2016-06-20 DIAGNOSIS — G894 Chronic pain syndrome: Secondary | ICD-10-CM | POA: Diagnosis not present

## 2016-06-22 DIAGNOSIS — Z992 Dependence on renal dialysis: Secondary | ICD-10-CM | POA: Diagnosis not present

## 2016-06-22 DIAGNOSIS — D472 Monoclonal gammopathy: Secondary | ICD-10-CM | POA: Diagnosis not present

## 2016-06-22 DIAGNOSIS — D631 Anemia in chronic kidney disease: Secondary | ICD-10-CM | POA: Diagnosis not present

## 2016-06-22 DIAGNOSIS — N186 End stage renal disease: Secondary | ICD-10-CM | POA: Diagnosis not present

## 2016-06-22 DIAGNOSIS — N2581 Secondary hyperparathyroidism of renal origin: Secondary | ICD-10-CM | POA: Diagnosis not present

## 2016-06-22 DIAGNOSIS — D509 Iron deficiency anemia, unspecified: Secondary | ICD-10-CM | POA: Diagnosis not present

## 2016-06-23 DIAGNOSIS — K219 Gastro-esophageal reflux disease without esophagitis: Secondary | ICD-10-CM | POA: Diagnosis not present

## 2016-06-23 DIAGNOSIS — F331 Major depressive disorder, recurrent, moderate: Secondary | ICD-10-CM | POA: Diagnosis not present

## 2016-06-23 DIAGNOSIS — F5101 Primary insomnia: Secondary | ICD-10-CM | POA: Diagnosis not present

## 2016-06-23 DIAGNOSIS — N186 End stage renal disease: Secondary | ICD-10-CM | POA: Diagnosis not present

## 2016-06-23 DIAGNOSIS — F411 Generalized anxiety disorder: Secondary | ICD-10-CM | POA: Diagnosis not present

## 2016-06-23 DIAGNOSIS — G8929 Other chronic pain: Secondary | ICD-10-CM | POA: Diagnosis not present

## 2016-06-23 DIAGNOSIS — M62838 Other muscle spasm: Secondary | ICD-10-CM | POA: Diagnosis not present

## 2016-06-24 DIAGNOSIS — D472 Monoclonal gammopathy: Secondary | ICD-10-CM | POA: Diagnosis not present

## 2016-06-24 DIAGNOSIS — D509 Iron deficiency anemia, unspecified: Secondary | ICD-10-CM | POA: Diagnosis not present

## 2016-06-24 DIAGNOSIS — N2581 Secondary hyperparathyroidism of renal origin: Secondary | ICD-10-CM | POA: Diagnosis not present

## 2016-06-24 DIAGNOSIS — D631 Anemia in chronic kidney disease: Secondary | ICD-10-CM | POA: Diagnosis not present

## 2016-06-24 DIAGNOSIS — N186 End stage renal disease: Secondary | ICD-10-CM | POA: Diagnosis not present

## 2016-06-24 DIAGNOSIS — Z992 Dependence on renal dialysis: Secondary | ICD-10-CM | POA: Diagnosis not present

## 2016-06-26 DIAGNOSIS — N186 End stage renal disease: Secondary | ICD-10-CM | POA: Diagnosis not present

## 2016-06-26 DIAGNOSIS — D509 Iron deficiency anemia, unspecified: Secondary | ICD-10-CM | POA: Diagnosis not present

## 2016-06-26 DIAGNOSIS — N2581 Secondary hyperparathyroidism of renal origin: Secondary | ICD-10-CM | POA: Diagnosis not present

## 2016-06-26 DIAGNOSIS — Z992 Dependence on renal dialysis: Secondary | ICD-10-CM | POA: Diagnosis not present

## 2016-06-26 DIAGNOSIS — D631 Anemia in chronic kidney disease: Secondary | ICD-10-CM | POA: Diagnosis not present

## 2016-06-26 DIAGNOSIS — F411 Generalized anxiety disorder: Secondary | ICD-10-CM | POA: Diagnosis not present

## 2016-06-26 DIAGNOSIS — F331 Major depressive disorder, recurrent, moderate: Secondary | ICD-10-CM | POA: Diagnosis not present

## 2016-06-26 DIAGNOSIS — D472 Monoclonal gammopathy: Secondary | ICD-10-CM | POA: Diagnosis not present

## 2016-06-29 DIAGNOSIS — N2581 Secondary hyperparathyroidism of renal origin: Secondary | ICD-10-CM | POA: Diagnosis not present

## 2016-06-29 DIAGNOSIS — N186 End stage renal disease: Secondary | ICD-10-CM | POA: Diagnosis not present

## 2016-06-29 DIAGNOSIS — Z992 Dependence on renal dialysis: Secondary | ICD-10-CM | POA: Diagnosis not present

## 2016-06-29 DIAGNOSIS — D472 Monoclonal gammopathy: Secondary | ICD-10-CM | POA: Diagnosis not present

## 2016-06-29 DIAGNOSIS — D509 Iron deficiency anemia, unspecified: Secondary | ICD-10-CM | POA: Diagnosis not present

## 2016-06-29 DIAGNOSIS — D631 Anemia in chronic kidney disease: Secondary | ICD-10-CM | POA: Diagnosis not present

## 2016-06-30 DIAGNOSIS — D631 Anemia in chronic kidney disease: Secondary | ICD-10-CM | POA: Diagnosis not present

## 2016-06-30 DIAGNOSIS — E611 Iron deficiency: Secondary | ICD-10-CM | POA: Diagnosis not present

## 2016-06-30 DIAGNOSIS — Z992 Dependence on renal dialysis: Secondary | ICD-10-CM | POA: Diagnosis not present

## 2016-06-30 DIAGNOSIS — D472 Monoclonal gammopathy: Secondary | ICD-10-CM | POA: Diagnosis not present

## 2016-06-30 DIAGNOSIS — N186 End stage renal disease: Secondary | ICD-10-CM | POA: Diagnosis not present

## 2016-06-30 DIAGNOSIS — N2581 Secondary hyperparathyroidism of renal origin: Secondary | ICD-10-CM | POA: Diagnosis not present

## 2016-07-01 DIAGNOSIS — D472 Monoclonal gammopathy: Secondary | ICD-10-CM | POA: Diagnosis not present

## 2016-07-01 DIAGNOSIS — D631 Anemia in chronic kidney disease: Secondary | ICD-10-CM | POA: Diagnosis not present

## 2016-07-01 DIAGNOSIS — Z992 Dependence on renal dialysis: Secondary | ICD-10-CM | POA: Diagnosis not present

## 2016-07-01 DIAGNOSIS — E611 Iron deficiency: Secondary | ICD-10-CM | POA: Diagnosis not present

## 2016-07-01 DIAGNOSIS — N2581 Secondary hyperparathyroidism of renal origin: Secondary | ICD-10-CM | POA: Diagnosis not present

## 2016-07-01 DIAGNOSIS — N186 End stage renal disease: Secondary | ICD-10-CM | POA: Diagnosis not present

## 2016-07-03 DIAGNOSIS — E611 Iron deficiency: Secondary | ICD-10-CM | POA: Diagnosis not present

## 2016-07-03 DIAGNOSIS — N186 End stage renal disease: Secondary | ICD-10-CM | POA: Diagnosis not present

## 2016-07-03 DIAGNOSIS — D472 Monoclonal gammopathy: Secondary | ICD-10-CM | POA: Diagnosis not present

## 2016-07-03 DIAGNOSIS — N2581 Secondary hyperparathyroidism of renal origin: Secondary | ICD-10-CM | POA: Diagnosis not present

## 2016-07-03 DIAGNOSIS — Z992 Dependence on renal dialysis: Secondary | ICD-10-CM | POA: Diagnosis not present

## 2016-07-03 DIAGNOSIS — D631 Anemia in chronic kidney disease: Secondary | ICD-10-CM | POA: Diagnosis not present

## 2016-07-06 DIAGNOSIS — D472 Monoclonal gammopathy: Secondary | ICD-10-CM | POA: Diagnosis not present

## 2016-07-06 DIAGNOSIS — D631 Anemia in chronic kidney disease: Secondary | ICD-10-CM | POA: Diagnosis not present

## 2016-07-06 DIAGNOSIS — N2581 Secondary hyperparathyroidism of renal origin: Secondary | ICD-10-CM | POA: Diagnosis not present

## 2016-07-06 DIAGNOSIS — Z992 Dependence on renal dialysis: Secondary | ICD-10-CM | POA: Diagnosis not present

## 2016-07-06 DIAGNOSIS — N186 End stage renal disease: Secondary | ICD-10-CM | POA: Diagnosis not present

## 2016-07-06 DIAGNOSIS — E611 Iron deficiency: Secondary | ICD-10-CM | POA: Diagnosis not present

## 2016-07-08 DIAGNOSIS — Z992 Dependence on renal dialysis: Secondary | ICD-10-CM | POA: Diagnosis not present

## 2016-07-08 DIAGNOSIS — D631 Anemia in chronic kidney disease: Secondary | ICD-10-CM | POA: Diagnosis not present

## 2016-07-08 DIAGNOSIS — N2581 Secondary hyperparathyroidism of renal origin: Secondary | ICD-10-CM | POA: Diagnosis not present

## 2016-07-08 DIAGNOSIS — N186 End stage renal disease: Secondary | ICD-10-CM | POA: Diagnosis not present

## 2016-07-08 DIAGNOSIS — E611 Iron deficiency: Secondary | ICD-10-CM | POA: Diagnosis not present

## 2016-07-08 DIAGNOSIS — D472 Monoclonal gammopathy: Secondary | ICD-10-CM | POA: Diagnosis not present

## 2016-07-10 DIAGNOSIS — N2581 Secondary hyperparathyroidism of renal origin: Secondary | ICD-10-CM | POA: Diagnosis not present

## 2016-07-10 DIAGNOSIS — E611 Iron deficiency: Secondary | ICD-10-CM | POA: Diagnosis not present

## 2016-07-10 DIAGNOSIS — D472 Monoclonal gammopathy: Secondary | ICD-10-CM | POA: Diagnosis not present

## 2016-07-10 DIAGNOSIS — Z992 Dependence on renal dialysis: Secondary | ICD-10-CM | POA: Diagnosis not present

## 2016-07-10 DIAGNOSIS — N186 End stage renal disease: Secondary | ICD-10-CM | POA: Diagnosis not present

## 2016-07-10 DIAGNOSIS — D631 Anemia in chronic kidney disease: Secondary | ICD-10-CM | POA: Diagnosis not present

## 2016-07-13 DIAGNOSIS — D631 Anemia in chronic kidney disease: Secondary | ICD-10-CM | POA: Diagnosis not present

## 2016-07-13 DIAGNOSIS — D472 Monoclonal gammopathy: Secondary | ICD-10-CM | POA: Diagnosis not present

## 2016-07-13 DIAGNOSIS — Z992 Dependence on renal dialysis: Secondary | ICD-10-CM | POA: Diagnosis not present

## 2016-07-13 DIAGNOSIS — N186 End stage renal disease: Secondary | ICD-10-CM | POA: Diagnosis not present

## 2016-07-13 DIAGNOSIS — N2581 Secondary hyperparathyroidism of renal origin: Secondary | ICD-10-CM | POA: Diagnosis not present

## 2016-07-13 DIAGNOSIS — E611 Iron deficiency: Secondary | ICD-10-CM | POA: Diagnosis not present

## 2016-07-15 DIAGNOSIS — N2581 Secondary hyperparathyroidism of renal origin: Secondary | ICD-10-CM | POA: Diagnosis not present

## 2016-07-15 DIAGNOSIS — E611 Iron deficiency: Secondary | ICD-10-CM | POA: Diagnosis not present

## 2016-07-15 DIAGNOSIS — D631 Anemia in chronic kidney disease: Secondary | ICD-10-CM | POA: Diagnosis not present

## 2016-07-15 DIAGNOSIS — N186 End stage renal disease: Secondary | ICD-10-CM | POA: Diagnosis not present

## 2016-07-15 DIAGNOSIS — D472 Monoclonal gammopathy: Secondary | ICD-10-CM | POA: Diagnosis not present

## 2016-07-15 DIAGNOSIS — Z992 Dependence on renal dialysis: Secondary | ICD-10-CM | POA: Diagnosis not present

## 2016-07-16 DIAGNOSIS — D472 Monoclonal gammopathy: Secondary | ICD-10-CM | POA: Diagnosis not present

## 2016-07-16 DIAGNOSIS — E611 Iron deficiency: Secondary | ICD-10-CM | POA: Diagnosis not present

## 2016-07-16 DIAGNOSIS — Z992 Dependence on renal dialysis: Secondary | ICD-10-CM | POA: Diagnosis not present

## 2016-07-16 DIAGNOSIS — N186 End stage renal disease: Secondary | ICD-10-CM | POA: Diagnosis not present

## 2016-07-16 DIAGNOSIS — N2581 Secondary hyperparathyroidism of renal origin: Secondary | ICD-10-CM | POA: Diagnosis not present

## 2016-07-16 DIAGNOSIS — D631 Anemia in chronic kidney disease: Secondary | ICD-10-CM | POA: Diagnosis not present

## 2016-07-17 DIAGNOSIS — E611 Iron deficiency: Secondary | ICD-10-CM | POA: Diagnosis not present

## 2016-07-17 DIAGNOSIS — N2581 Secondary hyperparathyroidism of renal origin: Secondary | ICD-10-CM | POA: Diagnosis not present

## 2016-07-17 DIAGNOSIS — D631 Anemia in chronic kidney disease: Secondary | ICD-10-CM | POA: Diagnosis not present

## 2016-07-17 DIAGNOSIS — D472 Monoclonal gammopathy: Secondary | ICD-10-CM | POA: Diagnosis not present

## 2016-07-17 DIAGNOSIS — N186 End stage renal disease: Secondary | ICD-10-CM | POA: Diagnosis not present

## 2016-07-17 DIAGNOSIS — Z992 Dependence on renal dialysis: Secondary | ICD-10-CM | POA: Diagnosis not present

## 2016-07-20 DIAGNOSIS — D472 Monoclonal gammopathy: Secondary | ICD-10-CM | POA: Diagnosis not present

## 2016-07-20 DIAGNOSIS — Z992 Dependence on renal dialysis: Secondary | ICD-10-CM | POA: Diagnosis not present

## 2016-07-20 DIAGNOSIS — N2581 Secondary hyperparathyroidism of renal origin: Secondary | ICD-10-CM | POA: Diagnosis not present

## 2016-07-20 DIAGNOSIS — N186 End stage renal disease: Secondary | ICD-10-CM | POA: Diagnosis not present

## 2016-07-20 DIAGNOSIS — E611 Iron deficiency: Secondary | ICD-10-CM | POA: Diagnosis not present

## 2016-07-20 DIAGNOSIS — D631 Anemia in chronic kidney disease: Secondary | ICD-10-CM | POA: Diagnosis not present

## 2016-07-21 DIAGNOSIS — N186 End stage renal disease: Secondary | ICD-10-CM | POA: Diagnosis not present

## 2016-07-21 DIAGNOSIS — Z992 Dependence on renal dialysis: Secondary | ICD-10-CM | POA: Diagnosis not present

## 2016-07-21 DIAGNOSIS — D631 Anemia in chronic kidney disease: Secondary | ICD-10-CM | POA: Diagnosis not present

## 2016-07-21 DIAGNOSIS — D472 Monoclonal gammopathy: Secondary | ICD-10-CM | POA: Diagnosis not present

## 2016-07-21 DIAGNOSIS — E611 Iron deficiency: Secondary | ICD-10-CM | POA: Diagnosis not present

## 2016-07-21 DIAGNOSIS — N2581 Secondary hyperparathyroidism of renal origin: Secondary | ICD-10-CM | POA: Diagnosis not present

## 2016-07-22 DIAGNOSIS — D472 Monoclonal gammopathy: Secondary | ICD-10-CM | POA: Diagnosis not present

## 2016-07-22 DIAGNOSIS — N186 End stage renal disease: Secondary | ICD-10-CM | POA: Diagnosis not present

## 2016-07-22 DIAGNOSIS — E611 Iron deficiency: Secondary | ICD-10-CM | POA: Diagnosis not present

## 2016-07-22 DIAGNOSIS — N2581 Secondary hyperparathyroidism of renal origin: Secondary | ICD-10-CM | POA: Diagnosis not present

## 2016-07-22 DIAGNOSIS — D631 Anemia in chronic kidney disease: Secondary | ICD-10-CM | POA: Diagnosis not present

## 2016-07-22 DIAGNOSIS — Z992 Dependence on renal dialysis: Secondary | ICD-10-CM | POA: Diagnosis not present

## 2016-07-23 DIAGNOSIS — D649 Anemia, unspecified: Secondary | ICD-10-CM | POA: Diagnosis not present

## 2016-07-24 DIAGNOSIS — N186 End stage renal disease: Secondary | ICD-10-CM | POA: Diagnosis not present

## 2016-07-24 DIAGNOSIS — Z992 Dependence on renal dialysis: Secondary | ICD-10-CM | POA: Diagnosis not present

## 2016-07-24 DIAGNOSIS — D472 Monoclonal gammopathy: Secondary | ICD-10-CM | POA: Diagnosis not present

## 2016-07-24 DIAGNOSIS — D631 Anemia in chronic kidney disease: Secondary | ICD-10-CM | POA: Diagnosis not present

## 2016-07-24 DIAGNOSIS — E611 Iron deficiency: Secondary | ICD-10-CM | POA: Diagnosis not present

## 2016-07-24 DIAGNOSIS — N2581 Secondary hyperparathyroidism of renal origin: Secondary | ICD-10-CM | POA: Diagnosis not present

## 2016-07-27 DIAGNOSIS — Z992 Dependence on renal dialysis: Secondary | ICD-10-CM | POA: Diagnosis not present

## 2016-07-27 DIAGNOSIS — N2581 Secondary hyperparathyroidism of renal origin: Secondary | ICD-10-CM | POA: Diagnosis not present

## 2016-07-27 DIAGNOSIS — E611 Iron deficiency: Secondary | ICD-10-CM | POA: Diagnosis not present

## 2016-07-27 DIAGNOSIS — D631 Anemia in chronic kidney disease: Secondary | ICD-10-CM | POA: Diagnosis not present

## 2016-07-27 DIAGNOSIS — D472 Monoclonal gammopathy: Secondary | ICD-10-CM | POA: Diagnosis not present

## 2016-07-27 DIAGNOSIS — N186 End stage renal disease: Secondary | ICD-10-CM | POA: Diagnosis not present

## 2016-07-28 ENCOUNTER — Ambulatory Visit (INDEPENDENT_AMBULATORY_CARE_PROVIDER_SITE_OTHER): Payer: Medicare Other | Admitting: Infectious Diseases

## 2016-07-28 ENCOUNTER — Encounter: Payer: Self-pay | Admitting: Infectious Diseases

## 2016-07-28 VITALS — BP 122/84 | HR 73 | Temp 99.4°F

## 2016-07-28 DIAGNOSIS — I871 Compression of vein: Secondary | ICD-10-CM | POA: Diagnosis not present

## 2016-07-28 DIAGNOSIS — N186 End stage renal disease: Secondary | ICD-10-CM | POA: Diagnosis not present

## 2016-07-28 DIAGNOSIS — F5101 Primary insomnia: Secondary | ICD-10-CM | POA: Diagnosis not present

## 2016-07-28 DIAGNOSIS — Z992 Dependence on renal dialysis: Secondary | ICD-10-CM

## 2016-07-28 DIAGNOSIS — F411 Generalized anxiety disorder: Secondary | ICD-10-CM | POA: Diagnosis not present

## 2016-07-28 DIAGNOSIS — F1010 Alcohol abuse, uncomplicated: Secondary | ICD-10-CM

## 2016-07-28 DIAGNOSIS — B20 Human immunodeficiency virus [HIV] disease: Secondary | ICD-10-CM | POA: Diagnosis present

## 2016-07-28 DIAGNOSIS — F331 Major depressive disorder, recurrent, moderate: Secondary | ICD-10-CM | POA: Diagnosis not present

## 2016-07-28 NOTE — Progress Notes (Signed)
   Subjective:    Patient ID: [name], female    DOB: [birth year], 66 y.o.   MRN: [id number]  HPI 66 yo F with HIV+, ESRD, diastolic CHF, parox aflutter, prev normal cath at [facility] (2016).staying at SNF [facility]). She is on DTGV/AZT/3TC dosed for her HD.  She was in hospital 2-15 to 2-18 foroccluded right subclavian vein, with stenosis of right innominate vein. Patient seen by vascular surgery, pt had ballon angioplasty of right subclavian vein and innominate vein. Also placement of a right femoral tunneled dialysis catheter- 04/17/16.  Her groin cath has since been removed.  Her RUE AVF is functioning well.   No problems with ART.  Feels worn out.  HIV 1 RNA Quant (copies/mL)  Date Value  07/23/2015 51 (H)  02/12/2015 <20  03/26/2014 <20   CD4 T Cell Abs (/uL)  Date Value  07/23/2015 250 (L)  02/12/2015 280 (L)  03/26/2014 140 (L)   Staying at SNF- "I love it".  Wt 47.3kgyesterday  CV- [place], P Renal- Bevacata  Review of Systems  Constitutional: Negative for activity change and unexpected weight change.  Eyes: Negative for visual disturbance.  Respiratory: Negative for cough and shortness of breath.   Cardiovascular: Negative for leg swelling.  Gastrointestinal: Positive for constipation.  Genitourinary: Negative for difficulty urinating.  anuric.  C/o jaw locking up. Has dental f/u.     Objective:   Physical Exam  Constitutional: She appears well-developed and well-nourished.  HENT:  Mouth/Throat: No oropharyngeal exudate.  Eyes: EOM are normal. Pupils are equal, round, and reactive to light.  Neck: Neck supple.  Cardiovascular: Normal rate, regular rhythm and normal heart sounds.   Pulmonary/Chest: Effort normal and breath sounds normal.  Abdominal: Soft. Bowel sounds are normal. She exhibits no distension. There is no tenderness.  Musculoskeletal: She exhibits no edema.  RUE fistula has +bruit. Non-tender. Clean.   Lymphadenopathy:    She has no  cervical adenopathy.      Assessment & Plan:

## 2016-07-28 NOTE — Assessment & Plan Note (Signed)
Denies further abuse/use.

## 2016-07-28 NOTE — Assessment & Plan Note (Signed)
Has been repaired. Appears to be functioning well.

## 2016-07-28 NOTE — Assessment & Plan Note (Signed)
She is doing well.  Will continue her current ART dosed renally.  Will see her back in 6 months Her vax are up to date.

## 2016-07-28 NOTE — Assessment & Plan Note (Signed)
Appreciate HD f/u Wt up slightly.

## 2016-07-29 DIAGNOSIS — E611 Iron deficiency: Secondary | ICD-10-CM | POA: Diagnosis not present

## 2016-07-29 DIAGNOSIS — Z992 Dependence on renal dialysis: Secondary | ICD-10-CM | POA: Diagnosis not present

## 2016-07-29 DIAGNOSIS — D472 Monoclonal gammopathy: Secondary | ICD-10-CM | POA: Diagnosis not present

## 2016-07-29 DIAGNOSIS — N186 End stage renal disease: Secondary | ICD-10-CM | POA: Diagnosis not present

## 2016-07-29 DIAGNOSIS — N2581 Secondary hyperparathyroidism of renal origin: Secondary | ICD-10-CM | POA: Diagnosis not present

## 2016-07-29 DIAGNOSIS — D631 Anemia in chronic kidney disease: Secondary | ICD-10-CM | POA: Diagnosis not present

## 2016-07-30 DIAGNOSIS — Z992 Dependence on renal dialysis: Secondary | ICD-10-CM | POA: Diagnosis not present

## 2016-07-30 DIAGNOSIS — N186 End stage renal disease: Secondary | ICD-10-CM | POA: Diagnosis not present

## 2016-07-31 DIAGNOSIS — N186 End stage renal disease: Secondary | ICD-10-CM | POA: Diagnosis not present

## 2016-07-31 DIAGNOSIS — N2581 Secondary hyperparathyroidism of renal origin: Secondary | ICD-10-CM | POA: Diagnosis not present

## 2016-07-31 DIAGNOSIS — E611 Iron deficiency: Secondary | ICD-10-CM | POA: Diagnosis not present

## 2016-07-31 DIAGNOSIS — D472 Monoclonal gammopathy: Secondary | ICD-10-CM | POA: Diagnosis not present

## 2016-07-31 DIAGNOSIS — Z992 Dependence on renal dialysis: Secondary | ICD-10-CM | POA: Diagnosis not present

## 2016-07-31 DIAGNOSIS — R112 Nausea with vomiting, unspecified: Secondary | ICD-10-CM | POA: Diagnosis not present

## 2016-08-03 DIAGNOSIS — N186 End stage renal disease: Secondary | ICD-10-CM | POA: Diagnosis not present

## 2016-08-03 DIAGNOSIS — D472 Monoclonal gammopathy: Secondary | ICD-10-CM | POA: Diagnosis not present

## 2016-08-03 DIAGNOSIS — R112 Nausea with vomiting, unspecified: Secondary | ICD-10-CM | POA: Diagnosis not present

## 2016-08-03 DIAGNOSIS — N2581 Secondary hyperparathyroidism of renal origin: Secondary | ICD-10-CM | POA: Diagnosis not present

## 2016-08-03 DIAGNOSIS — E611 Iron deficiency: Secondary | ICD-10-CM | POA: Diagnosis not present

## 2016-08-03 DIAGNOSIS — Z992 Dependence on renal dialysis: Secondary | ICD-10-CM | POA: Diagnosis not present

## 2016-08-05 DIAGNOSIS — E611 Iron deficiency: Secondary | ICD-10-CM | POA: Diagnosis not present

## 2016-08-05 DIAGNOSIS — Z992 Dependence on renal dialysis: Secondary | ICD-10-CM | POA: Diagnosis not present

## 2016-08-05 DIAGNOSIS — N186 End stage renal disease: Secondary | ICD-10-CM | POA: Diagnosis not present

## 2016-08-05 DIAGNOSIS — D472 Monoclonal gammopathy: Secondary | ICD-10-CM | POA: Diagnosis not present

## 2016-08-05 DIAGNOSIS — R112 Nausea with vomiting, unspecified: Secondary | ICD-10-CM | POA: Diagnosis not present

## 2016-08-05 DIAGNOSIS — N2581 Secondary hyperparathyroidism of renal origin: Secondary | ICD-10-CM | POA: Diagnosis not present

## 2016-08-07 DIAGNOSIS — E611 Iron deficiency: Secondary | ICD-10-CM | POA: Diagnosis not present

## 2016-08-07 DIAGNOSIS — Z992 Dependence on renal dialysis: Secondary | ICD-10-CM | POA: Diagnosis not present

## 2016-08-07 DIAGNOSIS — D472 Monoclonal gammopathy: Secondary | ICD-10-CM | POA: Diagnosis not present

## 2016-08-07 DIAGNOSIS — N2581 Secondary hyperparathyroidism of renal origin: Secondary | ICD-10-CM | POA: Diagnosis not present

## 2016-08-07 DIAGNOSIS — R112 Nausea with vomiting, unspecified: Secondary | ICD-10-CM | POA: Diagnosis not present

## 2016-08-07 DIAGNOSIS — N186 End stage renal disease: Secondary | ICD-10-CM | POA: Diagnosis not present

## 2016-08-10 DIAGNOSIS — N2581 Secondary hyperparathyroidism of renal origin: Secondary | ICD-10-CM | POA: Diagnosis not present

## 2016-08-10 DIAGNOSIS — D472 Monoclonal gammopathy: Secondary | ICD-10-CM | POA: Diagnosis not present

## 2016-08-10 DIAGNOSIS — N186 End stage renal disease: Secondary | ICD-10-CM | POA: Diagnosis not present

## 2016-08-10 DIAGNOSIS — R112 Nausea with vomiting, unspecified: Secondary | ICD-10-CM | POA: Diagnosis not present

## 2016-08-10 DIAGNOSIS — Z992 Dependence on renal dialysis: Secondary | ICD-10-CM | POA: Diagnosis not present

## 2016-08-10 DIAGNOSIS — E611 Iron deficiency: Secondary | ICD-10-CM | POA: Diagnosis not present

## 2016-08-12 DIAGNOSIS — N186 End stage renal disease: Secondary | ICD-10-CM | POA: Diagnosis not present

## 2016-08-12 DIAGNOSIS — N2581 Secondary hyperparathyroidism of renal origin: Secondary | ICD-10-CM | POA: Diagnosis not present

## 2016-08-12 DIAGNOSIS — D472 Monoclonal gammopathy: Secondary | ICD-10-CM | POA: Diagnosis not present

## 2016-08-12 DIAGNOSIS — E611 Iron deficiency: Secondary | ICD-10-CM | POA: Diagnosis not present

## 2016-08-12 DIAGNOSIS — R112 Nausea with vomiting, unspecified: Secondary | ICD-10-CM | POA: Diagnosis not present

## 2016-08-12 DIAGNOSIS — Z992 Dependence on renal dialysis: Secondary | ICD-10-CM | POA: Diagnosis not present

## 2016-08-13 DIAGNOSIS — D649 Anemia, unspecified: Secondary | ICD-10-CM | POA: Diagnosis not present

## 2016-08-14 DIAGNOSIS — R112 Nausea with vomiting, unspecified: Secondary | ICD-10-CM | POA: Diagnosis not present

## 2016-08-14 DIAGNOSIS — N186 End stage renal disease: Secondary | ICD-10-CM | POA: Diagnosis not present

## 2016-08-14 DIAGNOSIS — Z992 Dependence on renal dialysis: Secondary | ICD-10-CM | POA: Diagnosis not present

## 2016-08-14 DIAGNOSIS — D472 Monoclonal gammopathy: Secondary | ICD-10-CM | POA: Diagnosis not present

## 2016-08-14 DIAGNOSIS — E611 Iron deficiency: Secondary | ICD-10-CM | POA: Diagnosis not present

## 2016-08-14 DIAGNOSIS — N2581 Secondary hyperparathyroidism of renal origin: Secondary | ICD-10-CM | POA: Diagnosis not present

## 2016-08-17 DIAGNOSIS — Z992 Dependence on renal dialysis: Secondary | ICD-10-CM | POA: Diagnosis not present

## 2016-08-17 DIAGNOSIS — R112 Nausea with vomiting, unspecified: Secondary | ICD-10-CM | POA: Diagnosis not present

## 2016-08-17 DIAGNOSIS — N186 End stage renal disease: Secondary | ICD-10-CM | POA: Diagnosis not present

## 2016-08-17 DIAGNOSIS — D472 Monoclonal gammopathy: Secondary | ICD-10-CM | POA: Diagnosis not present

## 2016-08-17 DIAGNOSIS — N2581 Secondary hyperparathyroidism of renal origin: Secondary | ICD-10-CM | POA: Diagnosis not present

## 2016-08-17 DIAGNOSIS — E611 Iron deficiency: Secondary | ICD-10-CM | POA: Diagnosis not present

## 2016-08-19 DIAGNOSIS — Z992 Dependence on renal dialysis: Secondary | ICD-10-CM | POA: Diagnosis not present

## 2016-08-19 DIAGNOSIS — D472 Monoclonal gammopathy: Secondary | ICD-10-CM | POA: Diagnosis not present

## 2016-08-19 DIAGNOSIS — N186 End stage renal disease: Secondary | ICD-10-CM | POA: Diagnosis not present

## 2016-08-19 DIAGNOSIS — R112 Nausea with vomiting, unspecified: Secondary | ICD-10-CM | POA: Diagnosis not present

## 2016-08-19 DIAGNOSIS — N2581 Secondary hyperparathyroidism of renal origin: Secondary | ICD-10-CM | POA: Diagnosis not present

## 2016-08-19 DIAGNOSIS — E611 Iron deficiency: Secondary | ICD-10-CM | POA: Diagnosis not present

## 2016-08-20 DIAGNOSIS — D472 Monoclonal gammopathy: Secondary | ICD-10-CM | POA: Diagnosis not present

## 2016-08-20 DIAGNOSIS — G8929 Other chronic pain: Secondary | ICD-10-CM | POA: Diagnosis not present

## 2016-08-20 DIAGNOSIS — N2581 Secondary hyperparathyroidism of renal origin: Secondary | ICD-10-CM | POA: Diagnosis not present

## 2016-08-20 DIAGNOSIS — R112 Nausea with vomiting, unspecified: Secondary | ICD-10-CM | POA: Diagnosis not present

## 2016-08-20 DIAGNOSIS — E611 Iron deficiency: Secondary | ICD-10-CM | POA: Diagnosis not present

## 2016-08-20 DIAGNOSIS — N186 End stage renal disease: Secondary | ICD-10-CM | POA: Diagnosis not present

## 2016-08-20 DIAGNOSIS — Z992 Dependence on renal dialysis: Secondary | ICD-10-CM | POA: Diagnosis not present

## 2016-08-20 DIAGNOSIS — K219 Gastro-esophageal reflux disease without esophagitis: Secondary | ICD-10-CM | POA: Diagnosis not present

## 2016-08-21 DIAGNOSIS — D472 Monoclonal gammopathy: Secondary | ICD-10-CM | POA: Diagnosis not present

## 2016-08-21 DIAGNOSIS — N2581 Secondary hyperparathyroidism of renal origin: Secondary | ICD-10-CM | POA: Diagnosis not present

## 2016-08-21 DIAGNOSIS — E611 Iron deficiency: Secondary | ICD-10-CM | POA: Diagnosis not present

## 2016-08-21 DIAGNOSIS — N186 End stage renal disease: Secondary | ICD-10-CM | POA: Diagnosis not present

## 2016-08-21 DIAGNOSIS — Z992 Dependence on renal dialysis: Secondary | ICD-10-CM | POA: Diagnosis not present

## 2016-08-21 DIAGNOSIS — R112 Nausea with vomiting, unspecified: Secondary | ICD-10-CM | POA: Diagnosis not present

## 2016-08-22 DIAGNOSIS — G894 Chronic pain syndrome: Secondary | ICD-10-CM | POA: Diagnosis not present

## 2016-08-24 DIAGNOSIS — N2581 Secondary hyperparathyroidism of renal origin: Secondary | ICD-10-CM | POA: Diagnosis not present

## 2016-08-24 DIAGNOSIS — R112 Nausea with vomiting, unspecified: Secondary | ICD-10-CM | POA: Diagnosis not present

## 2016-08-24 DIAGNOSIS — E611 Iron deficiency: Secondary | ICD-10-CM | POA: Diagnosis not present

## 2016-08-24 DIAGNOSIS — D472 Monoclonal gammopathy: Secondary | ICD-10-CM | POA: Diagnosis not present

## 2016-08-24 DIAGNOSIS — N186 End stage renal disease: Secondary | ICD-10-CM | POA: Diagnosis not present

## 2016-08-24 DIAGNOSIS — Z992 Dependence on renal dialysis: Secondary | ICD-10-CM | POA: Diagnosis not present

## 2016-08-26 DIAGNOSIS — R112 Nausea with vomiting, unspecified: Secondary | ICD-10-CM | POA: Diagnosis not present

## 2016-08-26 DIAGNOSIS — N186 End stage renal disease: Secondary | ICD-10-CM | POA: Diagnosis not present

## 2016-08-26 DIAGNOSIS — D472 Monoclonal gammopathy: Secondary | ICD-10-CM | POA: Diagnosis not present

## 2016-08-26 DIAGNOSIS — E611 Iron deficiency: Secondary | ICD-10-CM | POA: Diagnosis not present

## 2016-08-26 DIAGNOSIS — N2581 Secondary hyperparathyroidism of renal origin: Secondary | ICD-10-CM | POA: Diagnosis not present

## 2016-08-26 DIAGNOSIS — Z992 Dependence on renal dialysis: Secondary | ICD-10-CM | POA: Diagnosis not present

## 2016-08-28 DIAGNOSIS — D472 Monoclonal gammopathy: Secondary | ICD-10-CM | POA: Diagnosis not present

## 2016-08-28 DIAGNOSIS — F331 Major depressive disorder, recurrent, moderate: Secondary | ICD-10-CM | POA: Diagnosis not present

## 2016-08-28 DIAGNOSIS — R112 Nausea with vomiting, unspecified: Secondary | ICD-10-CM | POA: Diagnosis not present

## 2016-08-28 DIAGNOSIS — N2581 Secondary hyperparathyroidism of renal origin: Secondary | ICD-10-CM | POA: Diagnosis not present

## 2016-08-28 DIAGNOSIS — F411 Generalized anxiety disorder: Secondary | ICD-10-CM | POA: Diagnosis not present

## 2016-08-28 DIAGNOSIS — Z992 Dependence on renal dialysis: Secondary | ICD-10-CM | POA: Diagnosis not present

## 2016-08-28 DIAGNOSIS — N186 End stage renal disease: Secondary | ICD-10-CM | POA: Diagnosis not present

## 2016-08-28 DIAGNOSIS — E611 Iron deficiency: Secondary | ICD-10-CM | POA: Diagnosis not present

## 2016-08-29 DIAGNOSIS — N186 End stage renal disease: Secondary | ICD-10-CM | POA: Diagnosis not present

## 2016-08-29 DIAGNOSIS — Z992 Dependence on renal dialysis: Secondary | ICD-10-CM | POA: Diagnosis not present

## 2016-08-30 DIAGNOSIS — R112 Nausea with vomiting, unspecified: Secondary | ICD-10-CM | POA: Diagnosis not present

## 2016-08-30 DIAGNOSIS — E611 Iron deficiency: Secondary | ICD-10-CM | POA: Diagnosis not present

## 2016-08-30 DIAGNOSIS — D509 Iron deficiency anemia, unspecified: Secondary | ICD-10-CM | POA: Diagnosis not present

## 2016-08-30 DIAGNOSIS — N186 End stage renal disease: Secondary | ICD-10-CM | POA: Diagnosis not present

## 2016-08-30 DIAGNOSIS — N2581 Secondary hyperparathyroidism of renal origin: Secondary | ICD-10-CM | POA: Diagnosis not present

## 2016-08-30 DIAGNOSIS — Z992 Dependence on renal dialysis: Secondary | ICD-10-CM | POA: Diagnosis not present

## 2016-08-30 DIAGNOSIS — D472 Monoclonal gammopathy: Secondary | ICD-10-CM | POA: Diagnosis not present

## 2016-08-31 DIAGNOSIS — D509 Iron deficiency anemia, unspecified: Secondary | ICD-10-CM | POA: Diagnosis not present

## 2016-08-31 DIAGNOSIS — D472 Monoclonal gammopathy: Secondary | ICD-10-CM | POA: Diagnosis not present

## 2016-08-31 DIAGNOSIS — R112 Nausea with vomiting, unspecified: Secondary | ICD-10-CM | POA: Diagnosis not present

## 2016-08-31 DIAGNOSIS — E611 Iron deficiency: Secondary | ICD-10-CM | POA: Diagnosis not present

## 2016-08-31 DIAGNOSIS — N2581 Secondary hyperparathyroidism of renal origin: Secondary | ICD-10-CM | POA: Diagnosis not present

## 2016-08-31 DIAGNOSIS — N186 End stage renal disease: Secondary | ICD-10-CM | POA: Diagnosis not present

## 2016-09-02 DIAGNOSIS — N186 End stage renal disease: Secondary | ICD-10-CM | POA: Diagnosis not present

## 2016-09-02 DIAGNOSIS — N2581 Secondary hyperparathyroidism of renal origin: Secondary | ICD-10-CM | POA: Diagnosis not present

## 2016-09-02 DIAGNOSIS — E611 Iron deficiency: Secondary | ICD-10-CM | POA: Diagnosis not present

## 2016-09-02 DIAGNOSIS — D509 Iron deficiency anemia, unspecified: Secondary | ICD-10-CM | POA: Diagnosis not present

## 2016-09-02 DIAGNOSIS — R112 Nausea with vomiting, unspecified: Secondary | ICD-10-CM | POA: Diagnosis not present

## 2016-09-02 DIAGNOSIS — D472 Monoclonal gammopathy: Secondary | ICD-10-CM | POA: Diagnosis not present

## 2016-09-04 DIAGNOSIS — E611 Iron deficiency: Secondary | ICD-10-CM | POA: Diagnosis not present

## 2016-09-04 DIAGNOSIS — N2581 Secondary hyperparathyroidism of renal origin: Secondary | ICD-10-CM | POA: Diagnosis not present

## 2016-09-04 DIAGNOSIS — D509 Iron deficiency anemia, unspecified: Secondary | ICD-10-CM | POA: Diagnosis not present

## 2016-09-04 DIAGNOSIS — D472 Monoclonal gammopathy: Secondary | ICD-10-CM | POA: Diagnosis not present

## 2016-09-04 DIAGNOSIS — N186 End stage renal disease: Secondary | ICD-10-CM | POA: Diagnosis not present

## 2016-09-04 DIAGNOSIS — R112 Nausea with vomiting, unspecified: Secondary | ICD-10-CM | POA: Diagnosis not present

## 2016-09-07 DIAGNOSIS — R112 Nausea with vomiting, unspecified: Secondary | ICD-10-CM | POA: Diagnosis not present

## 2016-09-07 DIAGNOSIS — E611 Iron deficiency: Secondary | ICD-10-CM | POA: Diagnosis not present

## 2016-09-07 DIAGNOSIS — N186 End stage renal disease: Secondary | ICD-10-CM | POA: Diagnosis not present

## 2016-09-07 DIAGNOSIS — Z992 Dependence on renal dialysis: Secondary | ICD-10-CM | POA: Diagnosis not present

## 2016-09-07 DIAGNOSIS — D472 Monoclonal gammopathy: Secondary | ICD-10-CM | POA: Diagnosis not present

## 2016-09-07 DIAGNOSIS — D509 Iron deficiency anemia, unspecified: Secondary | ICD-10-CM | POA: Diagnosis not present

## 2016-09-07 DIAGNOSIS — N2581 Secondary hyperparathyroidism of renal origin: Secondary | ICD-10-CM | POA: Diagnosis not present

## 2016-09-08 DIAGNOSIS — F411 Generalized anxiety disorder: Secondary | ICD-10-CM | POA: Diagnosis not present

## 2016-09-08 DIAGNOSIS — F5101 Primary insomnia: Secondary | ICD-10-CM | POA: Diagnosis not present

## 2016-09-08 DIAGNOSIS — F331 Major depressive disorder, recurrent, moderate: Secondary | ICD-10-CM | POA: Diagnosis not present

## 2016-09-09 DIAGNOSIS — R112 Nausea with vomiting, unspecified: Secondary | ICD-10-CM | POA: Diagnosis not present

## 2016-09-09 DIAGNOSIS — N2581 Secondary hyperparathyroidism of renal origin: Secondary | ICD-10-CM | POA: Diagnosis not present

## 2016-09-09 DIAGNOSIS — E611 Iron deficiency: Secondary | ICD-10-CM | POA: Diagnosis not present

## 2016-09-09 DIAGNOSIS — N186 End stage renal disease: Secondary | ICD-10-CM | POA: Diagnosis not present

## 2016-09-09 DIAGNOSIS — D509 Iron deficiency anemia, unspecified: Secondary | ICD-10-CM | POA: Diagnosis not present

## 2016-09-09 DIAGNOSIS — D472 Monoclonal gammopathy: Secondary | ICD-10-CM | POA: Diagnosis not present

## 2016-09-11 DIAGNOSIS — R112 Nausea with vomiting, unspecified: Secondary | ICD-10-CM | POA: Diagnosis not present

## 2016-09-11 DIAGNOSIS — E611 Iron deficiency: Secondary | ICD-10-CM | POA: Diagnosis not present

## 2016-09-11 DIAGNOSIS — N186 End stage renal disease: Secondary | ICD-10-CM | POA: Diagnosis not present

## 2016-09-11 DIAGNOSIS — D472 Monoclonal gammopathy: Secondary | ICD-10-CM | POA: Diagnosis not present

## 2016-09-11 DIAGNOSIS — N2581 Secondary hyperparathyroidism of renal origin: Secondary | ICD-10-CM | POA: Diagnosis not present

## 2016-09-11 DIAGNOSIS — D509 Iron deficiency anemia, unspecified: Secondary | ICD-10-CM | POA: Diagnosis not present

## 2016-09-14 DIAGNOSIS — N2581 Secondary hyperparathyroidism of renal origin: Secondary | ICD-10-CM | POA: Diagnosis not present

## 2016-09-14 DIAGNOSIS — D509 Iron deficiency anemia, unspecified: Secondary | ICD-10-CM | POA: Diagnosis not present

## 2016-09-14 DIAGNOSIS — E611 Iron deficiency: Secondary | ICD-10-CM | POA: Diagnosis not present

## 2016-09-14 DIAGNOSIS — N186 End stage renal disease: Secondary | ICD-10-CM | POA: Diagnosis not present

## 2016-09-14 DIAGNOSIS — R112 Nausea with vomiting, unspecified: Secondary | ICD-10-CM | POA: Diagnosis not present

## 2016-09-14 DIAGNOSIS — D472 Monoclonal gammopathy: Secondary | ICD-10-CM | POA: Diagnosis not present

## 2016-09-16 DIAGNOSIS — E611 Iron deficiency: Secondary | ICD-10-CM | POA: Diagnosis not present

## 2016-09-16 DIAGNOSIS — D472 Monoclonal gammopathy: Secondary | ICD-10-CM | POA: Diagnosis not present

## 2016-09-16 DIAGNOSIS — N186 End stage renal disease: Secondary | ICD-10-CM | POA: Diagnosis not present

## 2016-09-16 DIAGNOSIS — D509 Iron deficiency anemia, unspecified: Secondary | ICD-10-CM | POA: Diagnosis not present

## 2016-09-16 DIAGNOSIS — R112 Nausea with vomiting, unspecified: Secondary | ICD-10-CM | POA: Diagnosis not present

## 2016-09-16 DIAGNOSIS — N2581 Secondary hyperparathyroidism of renal origin: Secondary | ICD-10-CM | POA: Diagnosis not present

## 2016-09-18 DIAGNOSIS — N186 End stage renal disease: Secondary | ICD-10-CM | POA: Diagnosis not present

## 2016-09-18 DIAGNOSIS — N2581 Secondary hyperparathyroidism of renal origin: Secondary | ICD-10-CM | POA: Diagnosis not present

## 2016-09-18 DIAGNOSIS — R112 Nausea with vomiting, unspecified: Secondary | ICD-10-CM | POA: Diagnosis not present

## 2016-09-18 DIAGNOSIS — D472 Monoclonal gammopathy: Secondary | ICD-10-CM | POA: Diagnosis not present

## 2016-09-18 DIAGNOSIS — E611 Iron deficiency: Secondary | ICD-10-CM | POA: Diagnosis not present

## 2016-09-18 DIAGNOSIS — D509 Iron deficiency anemia, unspecified: Secondary | ICD-10-CM | POA: Diagnosis not present

## 2016-09-19 DIAGNOSIS — R112 Nausea with vomiting, unspecified: Secondary | ICD-10-CM | POA: Diagnosis not present

## 2016-09-19 DIAGNOSIS — N2581 Secondary hyperparathyroidism of renal origin: Secondary | ICD-10-CM | POA: Diagnosis not present

## 2016-09-19 DIAGNOSIS — N186 End stage renal disease: Secondary | ICD-10-CM | POA: Diagnosis not present

## 2016-09-19 DIAGNOSIS — E611 Iron deficiency: Secondary | ICD-10-CM | POA: Diagnosis not present

## 2016-09-19 DIAGNOSIS — D472 Monoclonal gammopathy: Secondary | ICD-10-CM | POA: Diagnosis not present

## 2016-09-19 DIAGNOSIS — D509 Iron deficiency anemia, unspecified: Secondary | ICD-10-CM | POA: Diagnosis not present

## 2016-09-21 DIAGNOSIS — N186 End stage renal disease: Secondary | ICD-10-CM | POA: Diagnosis not present

## 2016-09-21 DIAGNOSIS — E611 Iron deficiency: Secondary | ICD-10-CM | POA: Diagnosis not present

## 2016-09-21 DIAGNOSIS — N2581 Secondary hyperparathyroidism of renal origin: Secondary | ICD-10-CM | POA: Diagnosis not present

## 2016-09-21 DIAGNOSIS — D509 Iron deficiency anemia, unspecified: Secondary | ICD-10-CM | POA: Diagnosis not present

## 2016-09-21 DIAGNOSIS — R112 Nausea with vomiting, unspecified: Secondary | ICD-10-CM | POA: Diagnosis not present

## 2016-09-21 DIAGNOSIS — D472 Monoclonal gammopathy: Secondary | ICD-10-CM | POA: Diagnosis not present

## 2016-09-22 DIAGNOSIS — F411 Generalized anxiety disorder: Secondary | ICD-10-CM | POA: Diagnosis not present

## 2016-09-22 DIAGNOSIS — F331 Major depressive disorder, recurrent, moderate: Secondary | ICD-10-CM | POA: Diagnosis not present

## 2016-09-22 DIAGNOSIS — F5101 Primary insomnia: Secondary | ICD-10-CM | POA: Diagnosis not present

## 2016-09-23 DIAGNOSIS — E611 Iron deficiency: Secondary | ICD-10-CM | POA: Diagnosis not present

## 2016-09-23 DIAGNOSIS — N186 End stage renal disease: Secondary | ICD-10-CM | POA: Diagnosis not present

## 2016-09-23 DIAGNOSIS — R112 Nausea with vomiting, unspecified: Secondary | ICD-10-CM | POA: Diagnosis not present

## 2016-09-23 DIAGNOSIS — N2581 Secondary hyperparathyroidism of renal origin: Secondary | ICD-10-CM | POA: Diagnosis not present

## 2016-09-23 DIAGNOSIS — D472 Monoclonal gammopathy: Secondary | ICD-10-CM | POA: Diagnosis not present

## 2016-09-23 DIAGNOSIS — D509 Iron deficiency anemia, unspecified: Secondary | ICD-10-CM | POA: Diagnosis not present

## 2016-09-25 DIAGNOSIS — D509 Iron deficiency anemia, unspecified: Secondary | ICD-10-CM | POA: Diagnosis not present

## 2016-09-25 DIAGNOSIS — N2581 Secondary hyperparathyroidism of renal origin: Secondary | ICD-10-CM | POA: Diagnosis not present

## 2016-09-25 DIAGNOSIS — N186 End stage renal disease: Secondary | ICD-10-CM | POA: Diagnosis not present

## 2016-09-25 DIAGNOSIS — D472 Monoclonal gammopathy: Secondary | ICD-10-CM | POA: Diagnosis not present

## 2016-09-25 DIAGNOSIS — R112 Nausea with vomiting, unspecified: Secondary | ICD-10-CM | POA: Diagnosis not present

## 2016-09-25 DIAGNOSIS — E611 Iron deficiency: Secondary | ICD-10-CM | POA: Diagnosis not present

## 2016-09-26 DIAGNOSIS — G894 Chronic pain syndrome: Secondary | ICD-10-CM | POA: Diagnosis not present

## 2016-09-28 DIAGNOSIS — F411 Generalized anxiety disorder: Secondary | ICD-10-CM | POA: Diagnosis not present

## 2016-09-28 DIAGNOSIS — N186 End stage renal disease: Secondary | ICD-10-CM | POA: Diagnosis not present

## 2016-09-28 DIAGNOSIS — N2581 Secondary hyperparathyroidism of renal origin: Secondary | ICD-10-CM | POA: Diagnosis not present

## 2016-09-28 DIAGNOSIS — D509 Iron deficiency anemia, unspecified: Secondary | ICD-10-CM | POA: Diagnosis not present

## 2016-09-28 DIAGNOSIS — R112 Nausea with vomiting, unspecified: Secondary | ICD-10-CM | POA: Diagnosis not present

## 2016-09-28 DIAGNOSIS — D472 Monoclonal gammopathy: Secondary | ICD-10-CM | POA: Diagnosis not present

## 2016-09-28 DIAGNOSIS — F331 Major depressive disorder, recurrent, moderate: Secondary | ICD-10-CM | POA: Diagnosis not present

## 2016-09-28 DIAGNOSIS — E611 Iron deficiency: Secondary | ICD-10-CM | POA: Diagnosis not present

## 2016-09-29 DIAGNOSIS — Z992 Dependence on renal dialysis: Secondary | ICD-10-CM | POA: Diagnosis not present

## 2016-09-29 DIAGNOSIS — N186 End stage renal disease: Secondary | ICD-10-CM | POA: Diagnosis not present

## 2016-09-30 DIAGNOSIS — N186 End stage renal disease: Secondary | ICD-10-CM | POA: Diagnosis not present

## 2016-09-30 DIAGNOSIS — E611 Iron deficiency: Secondary | ICD-10-CM | POA: Diagnosis not present

## 2016-09-30 DIAGNOSIS — N2581 Secondary hyperparathyroidism of renal origin: Secondary | ICD-10-CM | POA: Diagnosis not present

## 2016-09-30 DIAGNOSIS — D631 Anemia in chronic kidney disease: Secondary | ICD-10-CM | POA: Diagnosis not present

## 2016-09-30 DIAGNOSIS — D472 Monoclonal gammopathy: Secondary | ICD-10-CM | POA: Diagnosis not present

## 2016-09-30 DIAGNOSIS — Z992 Dependence on renal dialysis: Secondary | ICD-10-CM | POA: Diagnosis not present

## 2016-10-02 DIAGNOSIS — E611 Iron deficiency: Secondary | ICD-10-CM | POA: Diagnosis not present

## 2016-10-02 DIAGNOSIS — N2581 Secondary hyperparathyroidism of renal origin: Secondary | ICD-10-CM | POA: Diagnosis not present

## 2016-10-02 DIAGNOSIS — D472 Monoclonal gammopathy: Secondary | ICD-10-CM | POA: Diagnosis not present

## 2016-10-02 DIAGNOSIS — Z992 Dependence on renal dialysis: Secondary | ICD-10-CM | POA: Diagnosis not present

## 2016-10-02 DIAGNOSIS — D631 Anemia in chronic kidney disease: Secondary | ICD-10-CM | POA: Diagnosis not present

## 2016-10-02 DIAGNOSIS — N186 End stage renal disease: Secondary | ICD-10-CM | POA: Diagnosis not present

## 2016-10-05 DIAGNOSIS — E611 Iron deficiency: Secondary | ICD-10-CM | POA: Diagnosis not present

## 2016-10-05 DIAGNOSIS — N186 End stage renal disease: Secondary | ICD-10-CM | POA: Diagnosis not present

## 2016-10-05 DIAGNOSIS — D472 Monoclonal gammopathy: Secondary | ICD-10-CM | POA: Diagnosis not present

## 2016-10-05 DIAGNOSIS — N2581 Secondary hyperparathyroidism of renal origin: Secondary | ICD-10-CM | POA: Diagnosis not present

## 2016-10-05 DIAGNOSIS — Z992 Dependence on renal dialysis: Secondary | ICD-10-CM | POA: Diagnosis not present

## 2016-10-05 DIAGNOSIS — D631 Anemia in chronic kidney disease: Secondary | ICD-10-CM | POA: Diagnosis not present

## 2016-10-06 DIAGNOSIS — F419 Anxiety disorder, unspecified: Secondary | ICD-10-CM | POA: Diagnosis not present

## 2016-10-06 DIAGNOSIS — I2729 Other secondary pulmonary hypertension: Secondary | ICD-10-CM | POA: Diagnosis not present

## 2016-10-06 DIAGNOSIS — F339 Major depressive disorder, recurrent, unspecified: Secondary | ICD-10-CM | POA: Diagnosis not present

## 2016-10-06 DIAGNOSIS — N186 End stage renal disease: Secondary | ICD-10-CM | POA: Diagnosis not present

## 2016-10-07 DIAGNOSIS — D631 Anemia in chronic kidney disease: Secondary | ICD-10-CM | POA: Diagnosis not present

## 2016-10-07 DIAGNOSIS — N2581 Secondary hyperparathyroidism of renal origin: Secondary | ICD-10-CM | POA: Diagnosis not present

## 2016-10-07 DIAGNOSIS — D472 Monoclonal gammopathy: Secondary | ICD-10-CM | POA: Diagnosis not present

## 2016-10-07 DIAGNOSIS — E611 Iron deficiency: Secondary | ICD-10-CM | POA: Diagnosis not present

## 2016-10-07 DIAGNOSIS — N186 End stage renal disease: Secondary | ICD-10-CM | POA: Diagnosis not present

## 2016-10-07 DIAGNOSIS — Z992 Dependence on renal dialysis: Secondary | ICD-10-CM | POA: Diagnosis not present

## 2016-10-09 DIAGNOSIS — N2581 Secondary hyperparathyroidism of renal origin: Secondary | ICD-10-CM | POA: Diagnosis not present

## 2016-10-09 DIAGNOSIS — E611 Iron deficiency: Secondary | ICD-10-CM | POA: Diagnosis not present

## 2016-10-09 DIAGNOSIS — D631 Anemia in chronic kidney disease: Secondary | ICD-10-CM | POA: Diagnosis not present

## 2016-10-09 DIAGNOSIS — Z992 Dependence on renal dialysis: Secondary | ICD-10-CM | POA: Diagnosis not present

## 2016-10-09 DIAGNOSIS — D472 Monoclonal gammopathy: Secondary | ICD-10-CM | POA: Diagnosis not present

## 2016-10-09 DIAGNOSIS — N186 End stage renal disease: Secondary | ICD-10-CM | POA: Diagnosis not present

## 2016-10-12 DIAGNOSIS — N2581 Secondary hyperparathyroidism of renal origin: Secondary | ICD-10-CM | POA: Diagnosis not present

## 2016-10-12 DIAGNOSIS — D472 Monoclonal gammopathy: Secondary | ICD-10-CM | POA: Diagnosis not present

## 2016-10-12 DIAGNOSIS — D631 Anemia in chronic kidney disease: Secondary | ICD-10-CM | POA: Diagnosis not present

## 2016-10-12 DIAGNOSIS — Z992 Dependence on renal dialysis: Secondary | ICD-10-CM | POA: Diagnosis not present

## 2016-10-12 DIAGNOSIS — N186 End stage renal disease: Secondary | ICD-10-CM | POA: Diagnosis not present

## 2016-10-12 DIAGNOSIS — E611 Iron deficiency: Secondary | ICD-10-CM | POA: Diagnosis not present

## 2016-10-14 DIAGNOSIS — D631 Anemia in chronic kidney disease: Secondary | ICD-10-CM | POA: Diagnosis not present

## 2016-10-14 DIAGNOSIS — D472 Monoclonal gammopathy: Secondary | ICD-10-CM | POA: Diagnosis not present

## 2016-10-14 DIAGNOSIS — N186 End stage renal disease: Secondary | ICD-10-CM | POA: Diagnosis not present

## 2016-10-14 DIAGNOSIS — D649 Anemia, unspecified: Secondary | ICD-10-CM | POA: Diagnosis not present

## 2016-10-14 DIAGNOSIS — Z992 Dependence on renal dialysis: Secondary | ICD-10-CM | POA: Diagnosis not present

## 2016-10-14 DIAGNOSIS — N2581 Secondary hyperparathyroidism of renal origin: Secondary | ICD-10-CM | POA: Diagnosis not present

## 2016-10-14 DIAGNOSIS — E611 Iron deficiency: Secondary | ICD-10-CM | POA: Diagnosis not present

## 2016-10-16 DIAGNOSIS — N186 End stage renal disease: Secondary | ICD-10-CM | POA: Diagnosis not present

## 2016-10-16 DIAGNOSIS — N2581 Secondary hyperparathyroidism of renal origin: Secondary | ICD-10-CM | POA: Diagnosis not present

## 2016-10-16 DIAGNOSIS — E611 Iron deficiency: Secondary | ICD-10-CM | POA: Diagnosis not present

## 2016-10-16 DIAGNOSIS — Z992 Dependence on renal dialysis: Secondary | ICD-10-CM | POA: Diagnosis not present

## 2016-10-16 DIAGNOSIS — D631 Anemia in chronic kidney disease: Secondary | ICD-10-CM | POA: Diagnosis not present

## 2016-10-16 DIAGNOSIS — D472 Monoclonal gammopathy: Secondary | ICD-10-CM | POA: Diagnosis not present

## 2016-10-19 DIAGNOSIS — Z992 Dependence on renal dialysis: Secondary | ICD-10-CM | POA: Diagnosis not present

## 2016-10-19 DIAGNOSIS — D631 Anemia in chronic kidney disease: Secondary | ICD-10-CM | POA: Diagnosis not present

## 2016-10-19 DIAGNOSIS — E611 Iron deficiency: Secondary | ICD-10-CM | POA: Diagnosis not present

## 2016-10-19 DIAGNOSIS — N186 End stage renal disease: Secondary | ICD-10-CM | POA: Diagnosis not present

## 2016-10-19 DIAGNOSIS — N2581 Secondary hyperparathyroidism of renal origin: Secondary | ICD-10-CM | POA: Diagnosis not present

## 2016-10-19 DIAGNOSIS — D472 Monoclonal gammopathy: Secondary | ICD-10-CM | POA: Diagnosis not present

## 2016-10-21 DIAGNOSIS — E611 Iron deficiency: Secondary | ICD-10-CM | POA: Diagnosis not present

## 2016-10-21 DIAGNOSIS — D631 Anemia in chronic kidney disease: Secondary | ICD-10-CM | POA: Diagnosis not present

## 2016-10-21 DIAGNOSIS — D472 Monoclonal gammopathy: Secondary | ICD-10-CM | POA: Diagnosis not present

## 2016-10-21 DIAGNOSIS — N2581 Secondary hyperparathyroidism of renal origin: Secondary | ICD-10-CM | POA: Diagnosis not present

## 2016-10-21 DIAGNOSIS — Z992 Dependence on renal dialysis: Secondary | ICD-10-CM | POA: Diagnosis not present

## 2016-10-21 DIAGNOSIS — N186 End stage renal disease: Secondary | ICD-10-CM | POA: Diagnosis not present

## 2016-10-23 DIAGNOSIS — D631 Anemia in chronic kidney disease: Secondary | ICD-10-CM | POA: Diagnosis not present

## 2016-10-23 DIAGNOSIS — D472 Monoclonal gammopathy: Secondary | ICD-10-CM | POA: Diagnosis not present

## 2016-10-23 DIAGNOSIS — N186 End stage renal disease: Secondary | ICD-10-CM | POA: Diagnosis not present

## 2016-10-23 DIAGNOSIS — E611 Iron deficiency: Secondary | ICD-10-CM | POA: Diagnosis not present

## 2016-10-23 DIAGNOSIS — N2581 Secondary hyperparathyroidism of renal origin: Secondary | ICD-10-CM | POA: Diagnosis not present

## 2016-10-23 DIAGNOSIS — Z992 Dependence on renal dialysis: Secondary | ICD-10-CM | POA: Diagnosis not present

## 2016-10-24 DIAGNOSIS — G894 Chronic pain syndrome: Secondary | ICD-10-CM | POA: Diagnosis not present

## 2016-10-26 DIAGNOSIS — N2581 Secondary hyperparathyroidism of renal origin: Secondary | ICD-10-CM | POA: Diagnosis not present

## 2016-10-26 DIAGNOSIS — E611 Iron deficiency: Secondary | ICD-10-CM | POA: Diagnosis not present

## 2016-10-26 DIAGNOSIS — N186 End stage renal disease: Secondary | ICD-10-CM | POA: Diagnosis not present

## 2016-10-26 DIAGNOSIS — D631 Anemia in chronic kidney disease: Secondary | ICD-10-CM | POA: Diagnosis not present

## 2016-10-26 DIAGNOSIS — Z992 Dependence on renal dialysis: Secondary | ICD-10-CM | POA: Diagnosis not present

## 2016-10-26 DIAGNOSIS — D472 Monoclonal gammopathy: Secondary | ICD-10-CM | POA: Diagnosis not present

## 2016-10-28 DIAGNOSIS — E611 Iron deficiency: Secondary | ICD-10-CM | POA: Diagnosis not present

## 2016-10-28 DIAGNOSIS — Z992 Dependence on renal dialysis: Secondary | ICD-10-CM | POA: Diagnosis not present

## 2016-10-28 DIAGNOSIS — D472 Monoclonal gammopathy: Secondary | ICD-10-CM | POA: Diagnosis not present

## 2016-10-28 DIAGNOSIS — N2581 Secondary hyperparathyroidism of renal origin: Secondary | ICD-10-CM | POA: Diagnosis not present

## 2016-10-28 DIAGNOSIS — D631 Anemia in chronic kidney disease: Secondary | ICD-10-CM | POA: Diagnosis not present

## 2016-10-28 DIAGNOSIS — N186 End stage renal disease: Secondary | ICD-10-CM | POA: Diagnosis not present

## 2016-10-30 DIAGNOSIS — N2581 Secondary hyperparathyroidism of renal origin: Secondary | ICD-10-CM | POA: Diagnosis not present

## 2016-10-30 DIAGNOSIS — E611 Iron deficiency: Secondary | ICD-10-CM | POA: Diagnosis not present

## 2016-10-30 DIAGNOSIS — F411 Generalized anxiety disorder: Secondary | ICD-10-CM | POA: Diagnosis not present

## 2016-10-30 DIAGNOSIS — F331 Major depressive disorder, recurrent, moderate: Secondary | ICD-10-CM | POA: Diagnosis not present

## 2016-10-30 DIAGNOSIS — D472 Monoclonal gammopathy: Secondary | ICD-10-CM | POA: Diagnosis not present

## 2016-10-30 DIAGNOSIS — N186 End stage renal disease: Secondary | ICD-10-CM | POA: Diagnosis not present

## 2016-10-30 DIAGNOSIS — Z992 Dependence on renal dialysis: Secondary | ICD-10-CM | POA: Diagnosis not present

## 2016-10-30 DIAGNOSIS — D631 Anemia in chronic kidney disease: Secondary | ICD-10-CM | POA: Diagnosis not present

## 2016-10-31 DIAGNOSIS — D472 Monoclonal gammopathy: Secondary | ICD-10-CM | POA: Diagnosis not present

## 2016-10-31 DIAGNOSIS — N2581 Secondary hyperparathyroidism of renal origin: Secondary | ICD-10-CM | POA: Diagnosis not present

## 2016-10-31 DIAGNOSIS — Z992 Dependence on renal dialysis: Secondary | ICD-10-CM | POA: Diagnosis not present

## 2016-10-31 DIAGNOSIS — N186 End stage renal disease: Secondary | ICD-10-CM | POA: Diagnosis not present

## 2016-10-31 DIAGNOSIS — D631 Anemia in chronic kidney disease: Secondary | ICD-10-CM | POA: Diagnosis not present

## 2016-10-31 DIAGNOSIS — E611 Iron deficiency: Secondary | ICD-10-CM | POA: Diagnosis not present

## 2016-11-02 DIAGNOSIS — Z992 Dependence on renal dialysis: Secondary | ICD-10-CM | POA: Diagnosis not present

## 2016-11-02 DIAGNOSIS — N186 End stage renal disease: Secondary | ICD-10-CM | POA: Diagnosis not present

## 2016-11-02 DIAGNOSIS — D631 Anemia in chronic kidney disease: Secondary | ICD-10-CM | POA: Diagnosis not present

## 2016-11-02 DIAGNOSIS — E611 Iron deficiency: Secondary | ICD-10-CM | POA: Diagnosis not present

## 2016-11-02 DIAGNOSIS — N2581 Secondary hyperparathyroidism of renal origin: Secondary | ICD-10-CM | POA: Diagnosis not present

## 2016-11-02 DIAGNOSIS — D472 Monoclonal gammopathy: Secondary | ICD-10-CM | POA: Diagnosis not present

## 2016-11-04 DIAGNOSIS — D472 Monoclonal gammopathy: Secondary | ICD-10-CM | POA: Diagnosis not present

## 2016-11-04 DIAGNOSIS — Z992 Dependence on renal dialysis: Secondary | ICD-10-CM | POA: Diagnosis not present

## 2016-11-04 DIAGNOSIS — N186 End stage renal disease: Secondary | ICD-10-CM | POA: Diagnosis not present

## 2016-11-04 DIAGNOSIS — E611 Iron deficiency: Secondary | ICD-10-CM | POA: Diagnosis not present

## 2016-11-04 DIAGNOSIS — D631 Anemia in chronic kidney disease: Secondary | ICD-10-CM | POA: Diagnosis not present

## 2016-11-04 DIAGNOSIS — N2581 Secondary hyperparathyroidism of renal origin: Secondary | ICD-10-CM | POA: Diagnosis not present

## 2016-11-04 DIAGNOSIS — D649 Anemia, unspecified: Secondary | ICD-10-CM | POA: Diagnosis not present

## 2016-11-06 DIAGNOSIS — Z992 Dependence on renal dialysis: Secondary | ICD-10-CM | POA: Diagnosis not present

## 2016-11-06 DIAGNOSIS — N186 End stage renal disease: Secondary | ICD-10-CM | POA: Diagnosis not present

## 2016-11-06 DIAGNOSIS — D472 Monoclonal gammopathy: Secondary | ICD-10-CM | POA: Diagnosis not present

## 2016-11-06 DIAGNOSIS — N2581 Secondary hyperparathyroidism of renal origin: Secondary | ICD-10-CM | POA: Diagnosis not present

## 2016-11-06 DIAGNOSIS — D631 Anemia in chronic kidney disease: Secondary | ICD-10-CM | POA: Diagnosis not present

## 2016-11-06 DIAGNOSIS — E611 Iron deficiency: Secondary | ICD-10-CM | POA: Diagnosis not present

## 2016-11-09 DIAGNOSIS — N186 End stage renal disease: Secondary | ICD-10-CM | POA: Diagnosis not present

## 2016-11-09 DIAGNOSIS — Z992 Dependence on renal dialysis: Secondary | ICD-10-CM | POA: Diagnosis not present

## 2016-11-09 DIAGNOSIS — E611 Iron deficiency: Secondary | ICD-10-CM | POA: Diagnosis not present

## 2016-11-09 DIAGNOSIS — N2581 Secondary hyperparathyroidism of renal origin: Secondary | ICD-10-CM | POA: Diagnosis not present

## 2016-11-09 DIAGNOSIS — D472 Monoclonal gammopathy: Secondary | ICD-10-CM | POA: Diagnosis not present

## 2016-11-09 DIAGNOSIS — D631 Anemia in chronic kidney disease: Secondary | ICD-10-CM | POA: Diagnosis not present

## 2016-11-11 DIAGNOSIS — D631 Anemia in chronic kidney disease: Secondary | ICD-10-CM | POA: Diagnosis not present

## 2016-11-11 DIAGNOSIS — E611 Iron deficiency: Secondary | ICD-10-CM | POA: Diagnosis not present

## 2016-11-11 DIAGNOSIS — N2581 Secondary hyperparathyroidism of renal origin: Secondary | ICD-10-CM | POA: Diagnosis not present

## 2016-11-11 DIAGNOSIS — N186 End stage renal disease: Secondary | ICD-10-CM | POA: Diagnosis not present

## 2016-11-11 DIAGNOSIS — Z992 Dependence on renal dialysis: Secondary | ICD-10-CM | POA: Diagnosis not present

## 2016-11-11 DIAGNOSIS — D472 Monoclonal gammopathy: Secondary | ICD-10-CM | POA: Diagnosis not present

## 2016-11-13 DIAGNOSIS — Z992 Dependence on renal dialysis: Secondary | ICD-10-CM | POA: Diagnosis not present

## 2016-11-13 DIAGNOSIS — N186 End stage renal disease: Secondary | ICD-10-CM | POA: Diagnosis not present

## 2016-11-13 DIAGNOSIS — D472 Monoclonal gammopathy: Secondary | ICD-10-CM | POA: Diagnosis not present

## 2016-11-13 DIAGNOSIS — N2581 Secondary hyperparathyroidism of renal origin: Secondary | ICD-10-CM | POA: Diagnosis not present

## 2016-11-13 DIAGNOSIS — D631 Anemia in chronic kidney disease: Secondary | ICD-10-CM | POA: Diagnosis not present

## 2016-11-13 DIAGNOSIS — E611 Iron deficiency: Secondary | ICD-10-CM | POA: Diagnosis not present

## 2016-11-16 DIAGNOSIS — E611 Iron deficiency: Secondary | ICD-10-CM | POA: Diagnosis not present

## 2016-11-16 DIAGNOSIS — D472 Monoclonal gammopathy: Secondary | ICD-10-CM | POA: Diagnosis not present

## 2016-11-16 DIAGNOSIS — N2581 Secondary hyperparathyroidism of renal origin: Secondary | ICD-10-CM | POA: Diagnosis not present

## 2016-11-16 DIAGNOSIS — N186 End stage renal disease: Secondary | ICD-10-CM | POA: Diagnosis not present

## 2016-11-16 DIAGNOSIS — D631 Anemia in chronic kidney disease: Secondary | ICD-10-CM | POA: Diagnosis not present

## 2016-11-16 DIAGNOSIS — Z992 Dependence on renal dialysis: Secondary | ICD-10-CM | POA: Diagnosis not present

## 2016-11-18 DIAGNOSIS — N2581 Secondary hyperparathyroidism of renal origin: Secondary | ICD-10-CM | POA: Diagnosis not present

## 2016-11-18 DIAGNOSIS — N186 End stage renal disease: Secondary | ICD-10-CM | POA: Diagnosis not present

## 2016-11-18 DIAGNOSIS — Z992 Dependence on renal dialysis: Secondary | ICD-10-CM | POA: Diagnosis not present

## 2016-11-18 DIAGNOSIS — D472 Monoclonal gammopathy: Secondary | ICD-10-CM | POA: Diagnosis not present

## 2016-11-18 DIAGNOSIS — E611 Iron deficiency: Secondary | ICD-10-CM | POA: Diagnosis not present

## 2016-11-18 DIAGNOSIS — D631 Anemia in chronic kidney disease: Secondary | ICD-10-CM | POA: Diagnosis not present

## 2016-11-20 DIAGNOSIS — D631 Anemia in chronic kidney disease: Secondary | ICD-10-CM | POA: Diagnosis not present

## 2016-11-20 DIAGNOSIS — Z992 Dependence on renal dialysis: Secondary | ICD-10-CM | POA: Diagnosis not present

## 2016-11-20 DIAGNOSIS — D472 Monoclonal gammopathy: Secondary | ICD-10-CM | POA: Diagnosis not present

## 2016-11-20 DIAGNOSIS — E611 Iron deficiency: Secondary | ICD-10-CM | POA: Diagnosis not present

## 2016-11-20 DIAGNOSIS — N2581 Secondary hyperparathyroidism of renal origin: Secondary | ICD-10-CM | POA: Diagnosis not present

## 2016-11-20 DIAGNOSIS — N186 End stage renal disease: Secondary | ICD-10-CM | POA: Diagnosis not present

## 2016-11-21 DIAGNOSIS — G894 Chronic pain syndrome: Secondary | ICD-10-CM | POA: Diagnosis not present

## 2016-11-23 ENCOUNTER — Other Ambulatory Visit: Payer: Self-pay | Admitting: Internal Medicine

## 2016-11-23 DIAGNOSIS — E611 Iron deficiency: Secondary | ICD-10-CM | POA: Diagnosis not present

## 2016-11-23 DIAGNOSIS — D472 Monoclonal gammopathy: Secondary | ICD-10-CM | POA: Diagnosis not present

## 2016-11-23 DIAGNOSIS — N2581 Secondary hyperparathyroidism of renal origin: Secondary | ICD-10-CM | POA: Diagnosis not present

## 2016-11-23 DIAGNOSIS — D631 Anemia in chronic kidney disease: Secondary | ICD-10-CM | POA: Diagnosis not present

## 2016-11-23 DIAGNOSIS — Z1239 Encounter for other screening for malignant neoplasm of breast: Secondary | ICD-10-CM

## 2016-11-23 DIAGNOSIS — Z992 Dependence on renal dialysis: Secondary | ICD-10-CM | POA: Diagnosis not present

## 2016-11-23 DIAGNOSIS — N186 End stage renal disease: Secondary | ICD-10-CM | POA: Diagnosis not present

## 2016-11-25 DIAGNOSIS — D631 Anemia in chronic kidney disease: Secondary | ICD-10-CM | POA: Diagnosis not present

## 2016-11-25 DIAGNOSIS — Z992 Dependence on renal dialysis: Secondary | ICD-10-CM | POA: Diagnosis not present

## 2016-11-25 DIAGNOSIS — N186 End stage renal disease: Secondary | ICD-10-CM | POA: Diagnosis not present

## 2016-11-25 DIAGNOSIS — D472 Monoclonal gammopathy: Secondary | ICD-10-CM | POA: Diagnosis not present

## 2016-11-25 DIAGNOSIS — N2581 Secondary hyperparathyroidism of renal origin: Secondary | ICD-10-CM | POA: Diagnosis not present

## 2016-11-25 DIAGNOSIS — E611 Iron deficiency: Secondary | ICD-10-CM | POA: Diagnosis not present

## 2016-11-27 DIAGNOSIS — D472 Monoclonal gammopathy: Secondary | ICD-10-CM | POA: Diagnosis not present

## 2016-11-27 DIAGNOSIS — N2581 Secondary hyperparathyroidism of renal origin: Secondary | ICD-10-CM | POA: Diagnosis not present

## 2016-11-27 DIAGNOSIS — Z992 Dependence on renal dialysis: Secondary | ICD-10-CM | POA: Diagnosis not present

## 2016-11-27 DIAGNOSIS — E611 Iron deficiency: Secondary | ICD-10-CM | POA: Diagnosis not present

## 2016-11-27 DIAGNOSIS — D631 Anemia in chronic kidney disease: Secondary | ICD-10-CM | POA: Diagnosis not present

## 2016-11-27 DIAGNOSIS — N186 End stage renal disease: Secondary | ICD-10-CM | POA: Diagnosis not present

## 2016-11-29 DIAGNOSIS — Z992 Dependence on renal dialysis: Secondary | ICD-10-CM | POA: Diagnosis not present

## 2016-11-29 DIAGNOSIS — N186 End stage renal disease: Secondary | ICD-10-CM | POA: Diagnosis not present

## 2016-11-30 DIAGNOSIS — N186 End stage renal disease: Secondary | ICD-10-CM | POA: Diagnosis not present

## 2016-11-30 DIAGNOSIS — E611 Iron deficiency: Secondary | ICD-10-CM | POA: Diagnosis not present

## 2016-11-30 DIAGNOSIS — Z992 Dependence on renal dialysis: Secondary | ICD-10-CM | POA: Diagnosis not present

## 2016-11-30 DIAGNOSIS — D509 Iron deficiency anemia, unspecified: Secondary | ICD-10-CM | POA: Diagnosis not present

## 2016-11-30 DIAGNOSIS — N2581 Secondary hyperparathyroidism of renal origin: Secondary | ICD-10-CM | POA: Diagnosis not present

## 2016-11-30 DIAGNOSIS — D472 Monoclonal gammopathy: Secondary | ICD-10-CM | POA: Diagnosis not present

## 2016-12-01 DIAGNOSIS — F411 Generalized anxiety disorder: Secondary | ICD-10-CM | POA: Diagnosis not present

## 2016-12-01 DIAGNOSIS — N186 End stage renal disease: Secondary | ICD-10-CM | POA: Diagnosis not present

## 2016-12-01 DIAGNOSIS — F331 Major depressive disorder, recurrent, moderate: Secondary | ICD-10-CM | POA: Diagnosis not present

## 2016-12-01 DIAGNOSIS — F339 Major depressive disorder, recurrent, unspecified: Secondary | ICD-10-CM | POA: Diagnosis not present

## 2016-12-01 DIAGNOSIS — I10 Essential (primary) hypertension: Secondary | ICD-10-CM | POA: Diagnosis not present

## 2016-12-01 DIAGNOSIS — F5101 Primary insomnia: Secondary | ICD-10-CM | POA: Diagnosis not present

## 2016-12-01 DIAGNOSIS — I2729 Other secondary pulmonary hypertension: Secondary | ICD-10-CM | POA: Diagnosis not present

## 2016-12-02 DIAGNOSIS — N186 End stage renal disease: Secondary | ICD-10-CM | POA: Diagnosis not present

## 2016-12-02 DIAGNOSIS — N2581 Secondary hyperparathyroidism of renal origin: Secondary | ICD-10-CM | POA: Diagnosis not present

## 2016-12-02 DIAGNOSIS — D472 Monoclonal gammopathy: Secondary | ICD-10-CM | POA: Diagnosis not present

## 2016-12-02 DIAGNOSIS — E611 Iron deficiency: Secondary | ICD-10-CM | POA: Diagnosis not present

## 2016-12-02 DIAGNOSIS — Z992 Dependence on renal dialysis: Secondary | ICD-10-CM | POA: Diagnosis not present

## 2016-12-02 DIAGNOSIS — D509 Iron deficiency anemia, unspecified: Secondary | ICD-10-CM | POA: Diagnosis not present

## 2016-12-04 DIAGNOSIS — N2581 Secondary hyperparathyroidism of renal origin: Secondary | ICD-10-CM | POA: Diagnosis not present

## 2016-12-04 DIAGNOSIS — N186 End stage renal disease: Secondary | ICD-10-CM | POA: Diagnosis not present

## 2016-12-04 DIAGNOSIS — E611 Iron deficiency: Secondary | ICD-10-CM | POA: Diagnosis not present

## 2016-12-04 DIAGNOSIS — Z992 Dependence on renal dialysis: Secondary | ICD-10-CM | POA: Diagnosis not present

## 2016-12-04 DIAGNOSIS — D472 Monoclonal gammopathy: Secondary | ICD-10-CM | POA: Diagnosis not present

## 2016-12-04 DIAGNOSIS — D509 Iron deficiency anemia, unspecified: Secondary | ICD-10-CM | POA: Diagnosis not present

## 2016-12-07 DIAGNOSIS — N186 End stage renal disease: Secondary | ICD-10-CM | POA: Diagnosis not present

## 2016-12-07 DIAGNOSIS — D509 Iron deficiency anemia, unspecified: Secondary | ICD-10-CM | POA: Diagnosis not present

## 2016-12-07 DIAGNOSIS — Z992 Dependence on renal dialysis: Secondary | ICD-10-CM | POA: Diagnosis not present

## 2016-12-07 DIAGNOSIS — N2581 Secondary hyperparathyroidism of renal origin: Secondary | ICD-10-CM | POA: Diagnosis not present

## 2016-12-07 DIAGNOSIS — E611 Iron deficiency: Secondary | ICD-10-CM | POA: Diagnosis not present

## 2016-12-07 DIAGNOSIS — D472 Monoclonal gammopathy: Secondary | ICD-10-CM | POA: Diagnosis not present

## 2016-12-09 DIAGNOSIS — E611 Iron deficiency: Secondary | ICD-10-CM | POA: Diagnosis not present

## 2016-12-09 DIAGNOSIS — D472 Monoclonal gammopathy: Secondary | ICD-10-CM | POA: Diagnosis not present

## 2016-12-09 DIAGNOSIS — N186 End stage renal disease: Secondary | ICD-10-CM | POA: Diagnosis not present

## 2016-12-09 DIAGNOSIS — Z992 Dependence on renal dialysis: Secondary | ICD-10-CM | POA: Diagnosis not present

## 2016-12-09 DIAGNOSIS — N2581 Secondary hyperparathyroidism of renal origin: Secondary | ICD-10-CM | POA: Diagnosis not present

## 2016-12-09 DIAGNOSIS — D509 Iron deficiency anemia, unspecified: Secondary | ICD-10-CM | POA: Diagnosis not present

## 2016-12-12 DIAGNOSIS — E611 Iron deficiency: Secondary | ICD-10-CM | POA: Diagnosis not present

## 2016-12-12 DIAGNOSIS — N186 End stage renal disease: Secondary | ICD-10-CM | POA: Diagnosis not present

## 2016-12-12 DIAGNOSIS — N2581 Secondary hyperparathyroidism of renal origin: Secondary | ICD-10-CM | POA: Diagnosis not present

## 2016-12-12 DIAGNOSIS — D472 Monoclonal gammopathy: Secondary | ICD-10-CM | POA: Diagnosis not present

## 2016-12-12 DIAGNOSIS — Z992 Dependence on renal dialysis: Secondary | ICD-10-CM | POA: Diagnosis not present

## 2016-12-12 DIAGNOSIS — D509 Iron deficiency anemia, unspecified: Secondary | ICD-10-CM | POA: Diagnosis not present

## 2016-12-14 ENCOUNTER — Encounter: Payer: Self-pay | Admitting: Family

## 2016-12-15 ENCOUNTER — Ambulatory Visit (INDEPENDENT_AMBULATORY_CARE_PROVIDER_SITE_OTHER): Payer: Medicare Other | Admitting: Family

## 2016-12-15 ENCOUNTER — Encounter: Payer: Self-pay | Admitting: Family

## 2016-12-15 VITALS — BP 159/99 | HR 64 | Temp 98.2°F | Resp 18 | Ht 62.0 in | Wt 108.9 lb

## 2016-12-15 DIAGNOSIS — N186 End stage renal disease: Secondary | ICD-10-CM

## 2016-12-15 DIAGNOSIS — Z992 Dependence on renal dialysis: Secondary | ICD-10-CM | POA: Diagnosis not present

## 2016-12-15 DIAGNOSIS — I770 Arteriovenous fistula, acquired: Secondary | ICD-10-CM | POA: Diagnosis not present

## 2016-12-15 DIAGNOSIS — T148XXA Other injury of unspecified body region, initial encounter: Secondary | ICD-10-CM

## 2016-12-15 DIAGNOSIS — Z23 Encounter for immunization: Secondary | ICD-10-CM | POA: Diagnosis not present

## 2016-12-15 NOTE — Progress Notes (Signed)
CC: 1 week history of two painful lumps at left arm non functioning AV fistula  History of Present Illness  LISET MCMONIGLE is a 66 y.o. (04/20/1950) female who is s/p balloon angioplasty of her right subclavian/innominate veins for an occlusion on 2-16- 2018 by Dr. Donzetta Matters. At that time she had very severe swelling of her right upper extremity. That has subsequently resolved. At same time a right femoral tunneled dialysis catheter was placed which she had been using for dialysis on M-W-F at [facility] in [place], until the catheter was removed. Her right upper and lower arm AV fistulas are used for HD access.    She is also s/p right AVF fistulogram and debridement of ulcer by Dr. Bridgett Larsson on 03-26-14.   Pt is a resident of [facility] facility in [place].   Pt was last evaluated by Dr. Donzetta Matters on 05-08-16. At that time she was following up from recent balloon angioplasty of her subclavian and innominate veins on the right for severe swelling of her right arm. She was dialyzing through tunneled dialysis catheter. Dr. Donzetta Matters released her that day to use the right arm fistula and after it has been used 3 times the tunneled dialysis catheter may be removed. Dr. Donzetta Matters would like pt to return if she has further swelling in her arm, she can otherwise follow up on a when necessary basis.  She returns today with report of raised and painful knot on left upper arm non functioning AVF, her nephrologist is Dr. Hinda Lenis.  Pt states she noticed 2 hard painful lumps at her left upper arm non functioning AV fistula about a week ago.  She denies fever or chills, denies drainage from either arm AVF.  She denies steal sx's in either upper extremity.    Past Medical History:  Diagnosis Date  . Anemia   . Anxiety   . Atrial flutter ([place]) 02/22/2015  . C. difficile colitis 10/10/11  . Chronic diarrhea   . Depression   . ESRD on hemodialysis ([place])   . HIV (human immunodeficiency virus infection) ([place])   .  Hypertension   . Insomnia   . Intracranial hemorrhage ([place])   . Muscle weakness (generalized)   . Pulmonary HTN ([place])   . Traumatic hematoma of right upper arm 02/22/2015    Social History Social History  Substance Use Topics  . Smoking status: Former Smoker    Quit date: 03/12/1983  . Smokeless tobacco: Never Used  . Alcohol use No    Family History Family History  Problem Relation Age of Onset  . Anesthesia problems Neg Hx   . Hypotension Neg Hx   . Malignant hyperthermia Neg Hx   . Pseudochol deficiency Neg Hx     Surgical History Past Surgical History:  Procedure Laterality Date  . A/V FISTULAGRAM N/A 04/17/2016   Procedure: A/V Fistulagram - Right Upper;  Surgeon: Waynetta Sandy, MD;  Location: [facility];  Service: Cardiovascular;  Laterality: N/A;  . BIOPSY THYROID    . Dialysis Shunts     previous one removed from left arm and now present one in right arm  . DIALYSIS/PERMA CATHETER INSERTION Right 04/17/2016   Procedure: dialysis Catheter Insertion central veinous;  Surgeon: Waynetta Sandy, MD;  Location: [place] [facility];  Service: Cardiovascular;  Laterality: Right;  . ESOPHAGOGASTRODUODENOSCOPY  12/15/2010   Procedure: ESOPHAGOGASTRODUODENOSCOPY (EGD);  Surgeon: Daneil Dolin, MD;  [facility];  Service: Endoscopy;  Laterality: N/A;  .  EXCISION OF BREAST BIOPSY Right 05/04/2012   Procedure: EXCISION OF BREAST BIOPSY;  Surgeon: Donato Heinz, MD;  Location: [facility];  Service: General;  Laterality: Right;  Right Excisional Breast Biopsy  . FISTULOGRAM Right 03/26/2014   Procedure: Right Arm Fistulogram with Venoplasty Right Subclavian Vein and Inominate Vein. Debridement Fistula Ulcer;  Surgeon: Conrad Wallace, MD;  Location: [facility];  Service: Vascular;  Laterality: Right;  . IR GENERIC HISTORICAL  05/19/2016   IR REMOVAL TUN CV CATH W/O FL 05/19/2016 Markus Daft, MD [facility]-INTERV RAD  . PERIPHERAL VASCULAR BALLOON ANGIOPLASTY Right  04/17/2016   Procedure: Peripheral Vascular Balloon Angioplasty;  Surgeon: Waynetta Sandy, MD;  Location: [place] [facility];  Service: Cardiovascular;  Laterality: Right;  Central segment AV Fistula  . SHUNTOGRAM Right 10/19/2013   Procedure: FISTULOGRAM;  Surgeon: Conrad Homestead, MD;  Location: [facility] CATH LAB;  Service: Cardiovascular;  Laterality: Right;  . TONSILLECTOMY      Allergies  Allergen Reactions  . Latex Rash  . Penicillins Rash    Has patient had a PCN reaction causing immediate rash, facial/tongue/throat swelling, SOB or lightheadedness with hypotension: No Has patient had a PCN reaction causing severe rash involving mucus membranes or skin necrosis: No Has patient had a PCN reaction that required hospitalization No Has patient had a PCN reaction occurring within the last 10 years: No If all of the above answers are "NO", then may proceed with Cephalosporin use.      Current Outpatient Prescriptions  Medication Sig Dispense Refill  . acetaminophen (TYLENOL) 325 MG tablet Take 650 mg by mouth every 4 (four) hours as needed for moderate pain.    Marland Kitchen albuterol (PROVENTIL HFA;VENTOLIN HFA) 108 (90 Base) MCG/ACT inhaler Inhale 2 puffs into the lungs every 4 (four) hours as needed for wheezing or shortness of breath.    Marland Kitchen buPROPion (WELLBUTRIN XL) 150 MG 24 hr tablet Take 150 mg by mouth daily.    . cinacalcet (SENSIPAR) 30 MG tablet Take 30 mg by mouth daily.    . cyclobenzaprine (FLEXERIL) 5 MG tablet Take 5-10 mg by mouth every 12 (twelve) hours as needed for muscle spasms.     . dolutegravir (TIVICAY) 50 MG tablet Take 50 mg by mouth daily.    Marland Kitchen epoetin alfa (EPOGEN,PROCRIT) 94854 UNIT/ML injection Inject 8,000 Units into the skin 3 (three) times a week. To be given at dialysis, Monday Wednesday and Friday    . fentaNYL (DURAGESIC - DOSED MCG/HR) 50 MCG/HR Apply one patch every 72 hours for pain. Remove old patch. External use only. Rotate sites. 10 patch 0  . lamivudine  (EPIVIR) 100 MG tablet Take 50 mg by mouth daily.    Marland Kitchen lidocaine-prilocaine (EMLA) cream Apply 1 application topically 3 (three) times a week. Apply 1 hour prior to dialysis access site on Monday Wednesday and Friday    . LORazepam (ATIVAN) 0.5 MG tablet Take 0.5 tablets (0.25 mg total) by mouth every 6 (six) hours as needed for anxiety. (Patient taking differently: Take 0.25 mg by mouth every 8 (eight) hours as needed for anxiety. ) 60 tablet 5  . Melatonin 3 MG TABS Take 3 mg by mouth at bedtime.    . metoprolol (LOPRESSOR) 50 MG tablet Take 50 mg by mouth 2 (two) times daily.    . multivitamin (RENA-VIT) TABS tablet Take 1 tablet by mouth at bedtime.    Marland Kitchen oxyCODONE (OXY IR/ROXICODONE) 5 MG immediate release tablet Take one tablet by mouth  every 6 hours as needed for moderate pain (Patient taking differently: Take 10 mg by mouth every 12 (twelve) hours as needed for moderate pain. ) 120 tablet 0  . pantoprazole (PROTONIX) 40 MG tablet Take 40 mg by mouth daily.    . promethazine (PHENERGAN) 12.5 MG tablet Take 12.5 mg by mouth 3 (three) times a week.    . promethazine (PHENERGAN) 12.5 MG tablet Take 12.5 mg by mouth every 6 (six) hours as needed for nausea or vomiting.    . sertraline (ZOLOFT) 100 MG tablet Take 150 mg by mouth daily.     . sevelamer carbonate (RENVELA) 800 MG tablet Take 2,400 mg by mouth 3 (three) times daily with meals.     . sildenafil (REVATIO) 20 MG tablet Take 20 mg by mouth 3 (three) times daily.     . vitamin C (ASCORBIC ACID) 500 MG tablet Take 500 mg by mouth 2 (two) times daily.    . zaleplon (SONATA) 5 MG capsule Take one capsule by mouth every night at bedtime for rest (Patient taking differently: Take 10 mg by mouth at bedtime. ) 30 capsule 0  . zidovudine (RETROVIR) 100 MG capsule Take 100 mg by mouth 3 (three) times daily.       No current facility-administered medications for this visit.      REVIEW OF SYSTEMS: see HPI for pertinent positives and negatives     PHYSICAL EXAMINATION:  Vitals:   12/15/16 1432 12/15/16 1435  BP: (!) 156/99 (!) 159/99  Pulse: 64   Resp: 18   Temp: 98.2 F (36.8 C)   TempSrc: Oral   SpO2: 96%   Weight: 108 lb 14.4 oz (49.4 kg)   Height: 5\' 2"  (1.575 m)    Body mass index is 19.92 kg/m.  General: The patient appears their stated age.   HEENT:  No gross abnormalities Pulmonary: Respirations are non-labored, CTAB.  Abdomen: Soft and non-tender. Musculoskeletal: There are no major deformities.   Neurologic: No focal weakness or paresthesias are detected, bilateral hand grip is 5/5.  Skin: There are no ulcer or rashes noted. Psychiatric: The patient has normal affect. Cardiovascular: There is a regular rate and rhythm without significant murmur appreciated.  Right upper and lower arm AVF has an audible bruit and palpable thrill, several areas of moderate aneurysmal dilation. Left upper arm non functioning AVF has two hard and tender lumps, no erythema, no drainage, no thrill or bruit.  Bilateral radial pulses are palpable. Several mildly enlarged veins on chest.     Medical Decision Making  KAHLANI GRABER is a 66 y.o. female who presents with 2 hematomas at left upper arm non functioning AVF, no evidence of infection or drainage.  Dr. Donnetta Hutching spoke with pt and NH attendant, and examined pt.    Pt chose conservative tx of allowing time for her body to absorb the hematomas. Warm moist packs or ice packs to hematomas to help with discomfort.  If she decides that she would like the hematomas excised, she or Dr. Hinda Lenis will notify us and we would schedule her for surgical excision of the hematomas.    Loring Liskey, Sharmon Leyden, RN, MSN, FNP-C [facility] Office: [phone]  12/15/2016, 2:50 PM  Clinic MD: Early

## 2016-12-16 DIAGNOSIS — D509 Iron deficiency anemia, unspecified: Secondary | ICD-10-CM | POA: Diagnosis not present

## 2016-12-16 DIAGNOSIS — N2581 Secondary hyperparathyroidism of renal origin: Secondary | ICD-10-CM | POA: Diagnosis not present

## 2016-12-16 DIAGNOSIS — E611 Iron deficiency: Secondary | ICD-10-CM | POA: Diagnosis not present

## 2016-12-16 DIAGNOSIS — D472 Monoclonal gammopathy: Secondary | ICD-10-CM | POA: Diagnosis not present

## 2016-12-16 DIAGNOSIS — Z992 Dependence on renal dialysis: Secondary | ICD-10-CM | POA: Diagnosis not present

## 2016-12-16 DIAGNOSIS — N186 End stage renal disease: Secondary | ICD-10-CM | POA: Diagnosis not present

## 2016-12-18 DIAGNOSIS — Z992 Dependence on renal dialysis: Secondary | ICD-10-CM | POA: Diagnosis not present

## 2016-12-18 DIAGNOSIS — E611 Iron deficiency: Secondary | ICD-10-CM | POA: Diagnosis not present

## 2016-12-18 DIAGNOSIS — D509 Iron deficiency anemia, unspecified: Secondary | ICD-10-CM | POA: Diagnosis not present

## 2016-12-18 DIAGNOSIS — N2581 Secondary hyperparathyroidism of renal origin: Secondary | ICD-10-CM | POA: Diagnosis not present

## 2016-12-18 DIAGNOSIS — D472 Monoclonal gammopathy: Secondary | ICD-10-CM | POA: Diagnosis not present

## 2016-12-18 DIAGNOSIS — N186 End stage renal disease: Secondary | ICD-10-CM | POA: Diagnosis not present

## 2016-12-21 DIAGNOSIS — N2581 Secondary hyperparathyroidism of renal origin: Secondary | ICD-10-CM | POA: Diagnosis not present

## 2016-12-21 DIAGNOSIS — E611 Iron deficiency: Secondary | ICD-10-CM | POA: Diagnosis not present

## 2016-12-21 DIAGNOSIS — D509 Iron deficiency anemia, unspecified: Secondary | ICD-10-CM | POA: Diagnosis not present

## 2016-12-21 DIAGNOSIS — N186 End stage renal disease: Secondary | ICD-10-CM | POA: Diagnosis not present

## 2016-12-21 DIAGNOSIS — Z992 Dependence on renal dialysis: Secondary | ICD-10-CM | POA: Diagnosis not present

## 2016-12-21 DIAGNOSIS — D472 Monoclonal gammopathy: Secondary | ICD-10-CM | POA: Diagnosis not present

## 2016-12-22 ENCOUNTER — Ambulatory Visit
Admission: RE | Admit: 2016-12-22 | Discharge: 2016-12-22 | Disposition: A | Discharge status: Home / Self Care | Payer: Medicare Other | Source: Ambulatory Visit | Attending: Internal Medicine | Admitting: Internal Medicine

## 2016-12-22 DIAGNOSIS — Z1239 Encounter for other screening for malignant neoplasm of breast: Secondary | ICD-10-CM

## 2016-12-22 DIAGNOSIS — Z1231 Encounter for screening mammogram for malignant neoplasm of breast: Secondary | ICD-10-CM | POA: Diagnosis not present

## 2016-12-23 DIAGNOSIS — E611 Iron deficiency: Secondary | ICD-10-CM | POA: Diagnosis not present

## 2016-12-23 DIAGNOSIS — D509 Iron deficiency anemia, unspecified: Secondary | ICD-10-CM | POA: Diagnosis not present

## 2016-12-23 DIAGNOSIS — N186 End stage renal disease: Secondary | ICD-10-CM | POA: Diagnosis not present

## 2016-12-23 DIAGNOSIS — D472 Monoclonal gammopathy: Secondary | ICD-10-CM | POA: Diagnosis not present

## 2016-12-23 DIAGNOSIS — N2581 Secondary hyperparathyroidism of renal origin: Secondary | ICD-10-CM | POA: Diagnosis not present

## 2016-12-23 DIAGNOSIS — Z992 Dependence on renal dialysis: Secondary | ICD-10-CM | POA: Diagnosis not present

## 2016-12-25 DIAGNOSIS — N2581 Secondary hyperparathyroidism of renal origin: Secondary | ICD-10-CM | POA: Diagnosis not present

## 2016-12-25 DIAGNOSIS — E611 Iron deficiency: Secondary | ICD-10-CM | POA: Diagnosis not present

## 2016-12-25 DIAGNOSIS — D472 Monoclonal gammopathy: Secondary | ICD-10-CM | POA: Diagnosis not present

## 2016-12-25 DIAGNOSIS — D509 Iron deficiency anemia, unspecified: Secondary | ICD-10-CM | POA: Diagnosis not present

## 2016-12-25 DIAGNOSIS — Z992 Dependence on renal dialysis: Secondary | ICD-10-CM | POA: Diagnosis not present

## 2016-12-25 DIAGNOSIS — N186 End stage renal disease: Secondary | ICD-10-CM | POA: Diagnosis not present

## 2016-12-26 DIAGNOSIS — G894 Chronic pain syndrome: Secondary | ICD-10-CM | POA: Diagnosis not present

## 2016-12-28 DIAGNOSIS — F411 Generalized anxiety disorder: Secondary | ICD-10-CM | POA: Diagnosis not present

## 2016-12-28 DIAGNOSIS — D472 Monoclonal gammopathy: Secondary | ICD-10-CM | POA: Diagnosis not present

## 2016-12-28 DIAGNOSIS — D509 Iron deficiency anemia, unspecified: Secondary | ICD-10-CM | POA: Diagnosis not present

## 2016-12-28 DIAGNOSIS — Z992 Dependence on renal dialysis: Secondary | ICD-10-CM | POA: Diagnosis not present

## 2016-12-28 DIAGNOSIS — E611 Iron deficiency: Secondary | ICD-10-CM | POA: Diagnosis not present

## 2016-12-28 DIAGNOSIS — F331 Major depressive disorder, recurrent, moderate: Secondary | ICD-10-CM | POA: Diagnosis not present

## 2016-12-28 DIAGNOSIS — N186 End stage renal disease: Secondary | ICD-10-CM | POA: Diagnosis not present

## 2016-12-28 DIAGNOSIS — N2581 Secondary hyperparathyroidism of renal origin: Secondary | ICD-10-CM | POA: Diagnosis not present

## 2016-12-29 DIAGNOSIS — N186 End stage renal disease: Secondary | ICD-10-CM | POA: Diagnosis not present

## 2016-12-29 DIAGNOSIS — Z992 Dependence on renal dialysis: Secondary | ICD-10-CM | POA: Diagnosis not present

## 2016-12-29 DIAGNOSIS — D649 Anemia, unspecified: Secondary | ICD-10-CM | POA: Diagnosis not present

## 2016-12-30 DIAGNOSIS — Z992 Dependence on renal dialysis: Secondary | ICD-10-CM | POA: Diagnosis not present

## 2016-12-30 DIAGNOSIS — N2581 Secondary hyperparathyroidism of renal origin: Secondary | ICD-10-CM | POA: Diagnosis not present

## 2016-12-30 DIAGNOSIS — D472 Monoclonal gammopathy: Secondary | ICD-10-CM | POA: Diagnosis not present

## 2016-12-30 DIAGNOSIS — N186 End stage renal disease: Secondary | ICD-10-CM | POA: Diagnosis not present

## 2016-12-30 DIAGNOSIS — D509 Iron deficiency anemia, unspecified: Secondary | ICD-10-CM | POA: Diagnosis not present

## 2016-12-30 DIAGNOSIS — E611 Iron deficiency: Secondary | ICD-10-CM | POA: Diagnosis not present

## 2016-12-31 DIAGNOSIS — D472 Monoclonal gammopathy: Secondary | ICD-10-CM | POA: Diagnosis not present

## 2016-12-31 DIAGNOSIS — Z992 Dependence on renal dialysis: Secondary | ICD-10-CM | POA: Diagnosis not present

## 2016-12-31 DIAGNOSIS — N2581 Secondary hyperparathyroidism of renal origin: Secondary | ICD-10-CM | POA: Diagnosis not present

## 2016-12-31 DIAGNOSIS — E611 Iron deficiency: Secondary | ICD-10-CM | POA: Diagnosis not present

## 2016-12-31 DIAGNOSIS — N186 End stage renal disease: Secondary | ICD-10-CM | POA: Diagnosis not present

## 2017-01-01 DIAGNOSIS — Z992 Dependence on renal dialysis: Secondary | ICD-10-CM | POA: Diagnosis not present

## 2017-01-01 DIAGNOSIS — N186 End stage renal disease: Secondary | ICD-10-CM | POA: Diagnosis not present

## 2017-01-01 DIAGNOSIS — E611 Iron deficiency: Secondary | ICD-10-CM | POA: Diagnosis not present

## 2017-01-01 DIAGNOSIS — N2581 Secondary hyperparathyroidism of renal origin: Secondary | ICD-10-CM | POA: Diagnosis not present

## 2017-01-01 DIAGNOSIS — D472 Monoclonal gammopathy: Secondary | ICD-10-CM | POA: Diagnosis not present

## 2017-01-04 DIAGNOSIS — N186 End stage renal disease: Secondary | ICD-10-CM | POA: Diagnosis not present

## 2017-01-04 DIAGNOSIS — N2581 Secondary hyperparathyroidism of renal origin: Secondary | ICD-10-CM | POA: Diagnosis not present

## 2017-01-04 DIAGNOSIS — Z992 Dependence on renal dialysis: Secondary | ICD-10-CM | POA: Diagnosis not present

## 2017-01-04 DIAGNOSIS — D472 Monoclonal gammopathy: Secondary | ICD-10-CM | POA: Diagnosis not present

## 2017-01-04 DIAGNOSIS — E611 Iron deficiency: Secondary | ICD-10-CM | POA: Diagnosis not present

## 2017-01-05 DIAGNOSIS — N186 End stage renal disease: Secondary | ICD-10-CM | POA: Diagnosis not present

## 2017-01-05 DIAGNOSIS — F419 Anxiety disorder, unspecified: Secondary | ICD-10-CM | POA: Diagnosis not present

## 2017-01-05 DIAGNOSIS — I2729 Other secondary pulmonary hypertension: Secondary | ICD-10-CM | POA: Diagnosis not present

## 2017-01-05 DIAGNOSIS — F339 Major depressive disorder, recurrent, unspecified: Secondary | ICD-10-CM | POA: Diagnosis not present

## 2017-01-08 DIAGNOSIS — Z992 Dependence on renal dialysis: Secondary | ICD-10-CM | POA: Diagnosis not present

## 2017-01-08 DIAGNOSIS — N186 End stage renal disease: Secondary | ICD-10-CM | POA: Diagnosis not present

## 2017-01-08 DIAGNOSIS — N2581 Secondary hyperparathyroidism of renal origin: Secondary | ICD-10-CM | POA: Diagnosis not present

## 2017-01-08 DIAGNOSIS — D472 Monoclonal gammopathy: Secondary | ICD-10-CM | POA: Diagnosis not present

## 2017-01-08 DIAGNOSIS — E611 Iron deficiency: Secondary | ICD-10-CM | POA: Diagnosis not present

## 2017-01-11 DIAGNOSIS — D472 Monoclonal gammopathy: Secondary | ICD-10-CM | POA: Diagnosis not present

## 2017-01-11 DIAGNOSIS — Z992 Dependence on renal dialysis: Secondary | ICD-10-CM | POA: Diagnosis not present

## 2017-01-11 DIAGNOSIS — N2581 Secondary hyperparathyroidism of renal origin: Secondary | ICD-10-CM | POA: Diagnosis not present

## 2017-01-11 DIAGNOSIS — N186 End stage renal disease: Secondary | ICD-10-CM | POA: Diagnosis not present

## 2017-01-11 DIAGNOSIS — E611 Iron deficiency: Secondary | ICD-10-CM | POA: Diagnosis not present

## 2017-01-13 DIAGNOSIS — N186 End stage renal disease: Secondary | ICD-10-CM | POA: Diagnosis not present

## 2017-01-13 DIAGNOSIS — N2581 Secondary hyperparathyroidism of renal origin: Secondary | ICD-10-CM | POA: Diagnosis not present

## 2017-01-13 DIAGNOSIS — D472 Monoclonal gammopathy: Secondary | ICD-10-CM | POA: Diagnosis not present

## 2017-01-13 DIAGNOSIS — E611 Iron deficiency: Secondary | ICD-10-CM | POA: Diagnosis not present

## 2017-01-13 DIAGNOSIS — Z992 Dependence on renal dialysis: Secondary | ICD-10-CM | POA: Diagnosis not present

## 2017-01-15 DIAGNOSIS — D472 Monoclonal gammopathy: Secondary | ICD-10-CM | POA: Diagnosis not present

## 2017-01-15 DIAGNOSIS — N186 End stage renal disease: Secondary | ICD-10-CM | POA: Diagnosis not present

## 2017-01-15 DIAGNOSIS — E611 Iron deficiency: Secondary | ICD-10-CM | POA: Diagnosis not present

## 2017-01-15 DIAGNOSIS — Z992 Dependence on renal dialysis: Secondary | ICD-10-CM | POA: Diagnosis not present

## 2017-01-15 DIAGNOSIS — N2581 Secondary hyperparathyroidism of renal origin: Secondary | ICD-10-CM | POA: Diagnosis not present

## 2017-01-18 DIAGNOSIS — E611 Iron deficiency: Secondary | ICD-10-CM | POA: Diagnosis not present

## 2017-01-18 DIAGNOSIS — N186 End stage renal disease: Secondary | ICD-10-CM | POA: Diagnosis not present

## 2017-01-18 DIAGNOSIS — Z992 Dependence on renal dialysis: Secondary | ICD-10-CM | POA: Diagnosis not present

## 2017-01-18 DIAGNOSIS — N2581 Secondary hyperparathyroidism of renal origin: Secondary | ICD-10-CM | POA: Diagnosis not present

## 2017-01-18 DIAGNOSIS — D472 Monoclonal gammopathy: Secondary | ICD-10-CM | POA: Diagnosis not present

## 2017-01-20 DIAGNOSIS — N2581 Secondary hyperparathyroidism of renal origin: Secondary | ICD-10-CM | POA: Diagnosis not present

## 2017-01-20 DIAGNOSIS — N186 End stage renal disease: Secondary | ICD-10-CM | POA: Diagnosis not present

## 2017-01-20 DIAGNOSIS — D472 Monoclonal gammopathy: Secondary | ICD-10-CM | POA: Diagnosis not present

## 2017-01-20 DIAGNOSIS — E611 Iron deficiency: Secondary | ICD-10-CM | POA: Diagnosis not present

## 2017-01-20 DIAGNOSIS — Z992 Dependence on renal dialysis: Secondary | ICD-10-CM | POA: Diagnosis not present

## 2017-01-22 DIAGNOSIS — Z79899 Other long term (current) drug therapy: Secondary | ICD-10-CM | POA: Diagnosis not present

## 2017-01-22 DIAGNOSIS — D649 Anemia, unspecified: Secondary | ICD-10-CM | POA: Diagnosis not present

## 2017-01-23 DIAGNOSIS — N186 End stage renal disease: Secondary | ICD-10-CM | POA: Diagnosis not present

## 2017-01-23 DIAGNOSIS — Z992 Dependence on renal dialysis: Secondary | ICD-10-CM | POA: Diagnosis not present

## 2017-01-23 DIAGNOSIS — N2581 Secondary hyperparathyroidism of renal origin: Secondary | ICD-10-CM | POA: Diagnosis not present

## 2017-01-23 DIAGNOSIS — D472 Monoclonal gammopathy: Secondary | ICD-10-CM | POA: Diagnosis not present

## 2017-01-23 DIAGNOSIS — E611 Iron deficiency: Secondary | ICD-10-CM | POA: Diagnosis not present

## 2017-01-25 DIAGNOSIS — N186 End stage renal disease: Secondary | ICD-10-CM | POA: Diagnosis not present

## 2017-01-25 DIAGNOSIS — N2581 Secondary hyperparathyroidism of renal origin: Secondary | ICD-10-CM | POA: Diagnosis not present

## 2017-01-25 DIAGNOSIS — D472 Monoclonal gammopathy: Secondary | ICD-10-CM | POA: Diagnosis not present

## 2017-01-25 DIAGNOSIS — Z992 Dependence on renal dialysis: Secondary | ICD-10-CM | POA: Diagnosis not present

## 2017-01-25 DIAGNOSIS — E611 Iron deficiency: Secondary | ICD-10-CM | POA: Diagnosis not present

## 2017-01-27 DIAGNOSIS — D472 Monoclonal gammopathy: Secondary | ICD-10-CM | POA: Diagnosis not present

## 2017-01-27 DIAGNOSIS — N186 End stage renal disease: Secondary | ICD-10-CM | POA: Diagnosis not present

## 2017-01-27 DIAGNOSIS — N2581 Secondary hyperparathyroidism of renal origin: Secondary | ICD-10-CM | POA: Diagnosis not present

## 2017-01-27 DIAGNOSIS — Z992 Dependence on renal dialysis: Secondary | ICD-10-CM | POA: Diagnosis not present

## 2017-01-27 DIAGNOSIS — E611 Iron deficiency: Secondary | ICD-10-CM | POA: Diagnosis not present

## 2017-01-28 ENCOUNTER — Ambulatory Visit: Payer: Medicare Other | Admitting: Infectious Diseases

## 2017-01-29 DIAGNOSIS — E611 Iron deficiency: Secondary | ICD-10-CM | POA: Diagnosis not present

## 2017-01-29 DIAGNOSIS — D472 Monoclonal gammopathy: Secondary | ICD-10-CM | POA: Diagnosis not present

## 2017-01-29 DIAGNOSIS — N186 End stage renal disease: Secondary | ICD-10-CM | POA: Diagnosis not present

## 2017-01-29 DIAGNOSIS — Z992 Dependence on renal dialysis: Secondary | ICD-10-CM | POA: Diagnosis not present

## 2017-01-29 DIAGNOSIS — N2581 Secondary hyperparathyroidism of renal origin: Secondary | ICD-10-CM | POA: Diagnosis not present

## 2017-01-30 DIAGNOSIS — E611 Iron deficiency: Secondary | ICD-10-CM | POA: Diagnosis not present

## 2017-01-30 DIAGNOSIS — N2581 Secondary hyperparathyroidism of renal origin: Secondary | ICD-10-CM | POA: Diagnosis not present

## 2017-01-30 DIAGNOSIS — D472 Monoclonal gammopathy: Secondary | ICD-10-CM | POA: Diagnosis not present

## 2017-01-30 DIAGNOSIS — Z992 Dependence on renal dialysis: Secondary | ICD-10-CM | POA: Diagnosis not present

## 2017-01-30 DIAGNOSIS — N186 End stage renal disease: Secondary | ICD-10-CM | POA: Diagnosis not present

## 2017-01-31 DIAGNOSIS — G894 Chronic pain syndrome: Secondary | ICD-10-CM | POA: Diagnosis not present

## 2017-02-01 DIAGNOSIS — E611 Iron deficiency: Secondary | ICD-10-CM | POA: Diagnosis not present

## 2017-02-01 DIAGNOSIS — N186 End stage renal disease: Secondary | ICD-10-CM | POA: Diagnosis not present

## 2017-02-01 DIAGNOSIS — Z992 Dependence on renal dialysis: Secondary | ICD-10-CM | POA: Diagnosis not present

## 2017-02-01 DIAGNOSIS — D472 Monoclonal gammopathy: Secondary | ICD-10-CM | POA: Diagnosis not present

## 2017-02-01 DIAGNOSIS — N2581 Secondary hyperparathyroidism of renal origin: Secondary | ICD-10-CM | POA: Diagnosis not present

## 2017-02-02 DIAGNOSIS — I10 Essential (primary) hypertension: Secondary | ICD-10-CM | POA: Diagnosis not present

## 2017-02-02 DIAGNOSIS — B20 Human immunodeficiency virus [HIV] disease: Secondary | ICD-10-CM | POA: Diagnosis not present

## 2017-02-02 DIAGNOSIS — F419 Anxiety disorder, unspecified: Secondary | ICD-10-CM | POA: Diagnosis not present

## 2017-02-02 DIAGNOSIS — N186 End stage renal disease: Secondary | ICD-10-CM | POA: Diagnosis not present

## 2017-02-04 DIAGNOSIS — N186 End stage renal disease: Secondary | ICD-10-CM | POA: Diagnosis not present

## 2017-02-04 DIAGNOSIS — B20 Human immunodeficiency virus [HIV] disease: Secondary | ICD-10-CM | POA: Diagnosis not present

## 2017-02-04 DIAGNOSIS — R410 Disorientation, unspecified: Secondary | ICD-10-CM | POA: Diagnosis not present

## 2017-02-04 DIAGNOSIS — M6281 Muscle weakness (generalized): Secondary | ICD-10-CM | POA: Diagnosis not present

## 2017-02-05 DIAGNOSIS — E611 Iron deficiency: Secondary | ICD-10-CM | POA: Diagnosis not present

## 2017-02-05 DIAGNOSIS — Z992 Dependence on renal dialysis: Secondary | ICD-10-CM | POA: Diagnosis not present

## 2017-02-05 DIAGNOSIS — D472 Monoclonal gammopathy: Secondary | ICD-10-CM | POA: Diagnosis not present

## 2017-02-05 DIAGNOSIS — N186 End stage renal disease: Secondary | ICD-10-CM | POA: Diagnosis not present

## 2017-02-05 DIAGNOSIS — N2581 Secondary hyperparathyroidism of renal origin: Secondary | ICD-10-CM | POA: Diagnosis not present

## 2017-02-09 DIAGNOSIS — D472 Monoclonal gammopathy: Secondary | ICD-10-CM | POA: Diagnosis not present

## 2017-02-09 DIAGNOSIS — N2581 Secondary hyperparathyroidism of renal origin: Secondary | ICD-10-CM | POA: Diagnosis not present

## 2017-02-09 DIAGNOSIS — E611 Iron deficiency: Secondary | ICD-10-CM | POA: Diagnosis not present

## 2017-02-09 DIAGNOSIS — Z992 Dependence on renal dialysis: Secondary | ICD-10-CM | POA: Diagnosis not present

## 2017-02-09 DIAGNOSIS — N186 End stage renal disease: Secondary | ICD-10-CM | POA: Diagnosis not present

## 2017-02-10 DIAGNOSIS — N2581 Secondary hyperparathyroidism of renal origin: Secondary | ICD-10-CM | POA: Diagnosis not present

## 2017-02-10 DIAGNOSIS — D472 Monoclonal gammopathy: Secondary | ICD-10-CM | POA: Diagnosis not present

## 2017-02-10 DIAGNOSIS — E611 Iron deficiency: Secondary | ICD-10-CM | POA: Diagnosis not present

## 2017-02-10 DIAGNOSIS — N186 End stage renal disease: Secondary | ICD-10-CM | POA: Diagnosis not present

## 2017-02-10 DIAGNOSIS — Z992 Dependence on renal dialysis: Secondary | ICD-10-CM | POA: Diagnosis not present

## 2017-02-12 DIAGNOSIS — N186 End stage renal disease: Secondary | ICD-10-CM | POA: Diagnosis not present

## 2017-02-12 DIAGNOSIS — N2581 Secondary hyperparathyroidism of renal origin: Secondary | ICD-10-CM | POA: Diagnosis not present

## 2017-02-12 DIAGNOSIS — D472 Monoclonal gammopathy: Secondary | ICD-10-CM | POA: Diagnosis not present

## 2017-02-12 DIAGNOSIS — Z992 Dependence on renal dialysis: Secondary | ICD-10-CM | POA: Diagnosis not present

## 2017-02-12 DIAGNOSIS — E611 Iron deficiency: Secondary | ICD-10-CM | POA: Diagnosis not present

## 2017-02-13 DIAGNOSIS — N186 End stage renal disease: Secondary | ICD-10-CM | POA: Diagnosis not present

## 2017-02-14 DIAGNOSIS — N186 End stage renal disease: Secondary | ICD-10-CM | POA: Diagnosis not present

## 2017-02-15 DIAGNOSIS — N186 End stage renal disease: Secondary | ICD-10-CM | POA: Diagnosis not present

## 2017-02-15 DIAGNOSIS — Z992 Dependence on renal dialysis: Secondary | ICD-10-CM | POA: Diagnosis not present

## 2017-02-15 DIAGNOSIS — D472 Monoclonal gammopathy: Secondary | ICD-10-CM | POA: Diagnosis not present

## 2017-02-15 DIAGNOSIS — E611 Iron deficiency: Secondary | ICD-10-CM | POA: Diagnosis not present

## 2017-02-15 DIAGNOSIS — N2581 Secondary hyperparathyroidism of renal origin: Secondary | ICD-10-CM | POA: Diagnosis not present

## 2017-02-17 DIAGNOSIS — N186 End stage renal disease: Secondary | ICD-10-CM | POA: Diagnosis not present

## 2017-02-17 DIAGNOSIS — Z992 Dependence on renal dialysis: Secondary | ICD-10-CM | POA: Diagnosis not present

## 2017-02-17 DIAGNOSIS — N2581 Secondary hyperparathyroidism of renal origin: Secondary | ICD-10-CM | POA: Diagnosis not present

## 2017-02-17 DIAGNOSIS — E611 Iron deficiency: Secondary | ICD-10-CM | POA: Diagnosis not present

## 2017-02-17 DIAGNOSIS — D472 Monoclonal gammopathy: Secondary | ICD-10-CM | POA: Diagnosis not present

## 2017-02-18 DIAGNOSIS — N186 End stage renal disease: Secondary | ICD-10-CM | POA: Diagnosis not present

## 2017-02-19 DIAGNOSIS — Z992 Dependence on renal dialysis: Secondary | ICD-10-CM | POA: Diagnosis not present

## 2017-02-19 DIAGNOSIS — D472 Monoclonal gammopathy: Secondary | ICD-10-CM | POA: Diagnosis not present

## 2017-02-19 DIAGNOSIS — E611 Iron deficiency: Secondary | ICD-10-CM | POA: Diagnosis not present

## 2017-02-19 DIAGNOSIS — N2581 Secondary hyperparathyroidism of renal origin: Secondary | ICD-10-CM | POA: Diagnosis not present

## 2017-02-19 DIAGNOSIS — N186 End stage renal disease: Secondary | ICD-10-CM | POA: Diagnosis not present

## 2017-02-21 DIAGNOSIS — E611 Iron deficiency: Secondary | ICD-10-CM | POA: Diagnosis not present

## 2017-02-21 DIAGNOSIS — Z992 Dependence on renal dialysis: Secondary | ICD-10-CM | POA: Diagnosis not present

## 2017-02-21 DIAGNOSIS — D472 Monoclonal gammopathy: Secondary | ICD-10-CM | POA: Diagnosis not present

## 2017-02-21 DIAGNOSIS — N2581 Secondary hyperparathyroidism of renal origin: Secondary | ICD-10-CM | POA: Diagnosis not present

## 2017-02-21 DIAGNOSIS — N186 End stage renal disease: Secondary | ICD-10-CM | POA: Diagnosis not present

## 2017-02-23 DIAGNOSIS — N186 End stage renal disease: Secondary | ICD-10-CM | POA: Diagnosis not present

## 2017-02-24 DIAGNOSIS — D472 Monoclonal gammopathy: Secondary | ICD-10-CM | POA: Diagnosis not present

## 2017-02-24 DIAGNOSIS — N186 End stage renal disease: Secondary | ICD-10-CM | POA: Diagnosis not present

## 2017-02-24 DIAGNOSIS — Z992 Dependence on renal dialysis: Secondary | ICD-10-CM | POA: Diagnosis not present

## 2017-02-24 DIAGNOSIS — N2581 Secondary hyperparathyroidism of renal origin: Secondary | ICD-10-CM | POA: Diagnosis not present

## 2017-02-24 DIAGNOSIS — E611 Iron deficiency: Secondary | ICD-10-CM | POA: Diagnosis not present

## 2017-02-25 DIAGNOSIS — D649 Anemia, unspecified: Secondary | ICD-10-CM | POA: Diagnosis not present

## 2017-02-25 DIAGNOSIS — N186 End stage renal disease: Secondary | ICD-10-CM | POA: Diagnosis not present

## 2017-02-26 DIAGNOSIS — D472 Monoclonal gammopathy: Secondary | ICD-10-CM | POA: Diagnosis not present

## 2017-02-26 DIAGNOSIS — Z992 Dependence on renal dialysis: Secondary | ICD-10-CM | POA: Diagnosis not present

## 2017-02-26 DIAGNOSIS — N2581 Secondary hyperparathyroidism of renal origin: Secondary | ICD-10-CM | POA: Diagnosis not present

## 2017-02-26 DIAGNOSIS — N186 End stage renal disease: Secondary | ICD-10-CM | POA: Diagnosis not present

## 2017-02-26 DIAGNOSIS — E611 Iron deficiency: Secondary | ICD-10-CM | POA: Diagnosis not present

## 2017-03-01 DIAGNOSIS — Z992 Dependence on renal dialysis: Secondary | ICD-10-CM | POA: Diagnosis not present

## 2017-03-01 DIAGNOSIS — N186 End stage renal disease: Secondary | ICD-10-CM | POA: Diagnosis not present

## 2017-03-01 DIAGNOSIS — N2581 Secondary hyperparathyroidism of renal origin: Secondary | ICD-10-CM | POA: Diagnosis not present

## 2017-03-01 DIAGNOSIS — E611 Iron deficiency: Secondary | ICD-10-CM | POA: Diagnosis not present

## 2017-03-01 DIAGNOSIS — D472 Monoclonal gammopathy: Secondary | ICD-10-CM | POA: Diagnosis not present

## 2017-03-03 DIAGNOSIS — D472 Monoclonal gammopathy: Secondary | ICD-10-CM | POA: Diagnosis not present

## 2017-03-03 DIAGNOSIS — N2581 Secondary hyperparathyroidism of renal origin: Secondary | ICD-10-CM | POA: Diagnosis not present

## 2017-03-03 DIAGNOSIS — D509 Iron deficiency anemia, unspecified: Secondary | ICD-10-CM | POA: Diagnosis not present

## 2017-03-03 DIAGNOSIS — Z992 Dependence on renal dialysis: Secondary | ICD-10-CM | POA: Diagnosis not present

## 2017-03-03 DIAGNOSIS — N186 End stage renal disease: Secondary | ICD-10-CM | POA: Diagnosis not present

## 2017-03-05 DIAGNOSIS — D509 Iron deficiency anemia, unspecified: Secondary | ICD-10-CM | POA: Diagnosis not present

## 2017-03-05 DIAGNOSIS — D472 Monoclonal gammopathy: Secondary | ICD-10-CM | POA: Diagnosis not present

## 2017-03-05 DIAGNOSIS — N186 End stage renal disease: Secondary | ICD-10-CM | POA: Diagnosis not present

## 2017-03-05 DIAGNOSIS — Z992 Dependence on renal dialysis: Secondary | ICD-10-CM | POA: Diagnosis not present

## 2017-03-05 DIAGNOSIS — N2581 Secondary hyperparathyroidism of renal origin: Secondary | ICD-10-CM | POA: Diagnosis not present

## 2017-03-06 DIAGNOSIS — G894 Chronic pain syndrome: Secondary | ICD-10-CM | POA: Diagnosis not present

## 2017-03-08 DIAGNOSIS — Z992 Dependence on renal dialysis: Secondary | ICD-10-CM | POA: Diagnosis not present

## 2017-03-08 DIAGNOSIS — D509 Iron deficiency anemia, unspecified: Secondary | ICD-10-CM | POA: Diagnosis not present

## 2017-03-08 DIAGNOSIS — N2581 Secondary hyperparathyroidism of renal origin: Secondary | ICD-10-CM | POA: Diagnosis not present

## 2017-03-08 DIAGNOSIS — D472 Monoclonal gammopathy: Secondary | ICD-10-CM | POA: Diagnosis not present

## 2017-03-08 DIAGNOSIS — N186 End stage renal disease: Secondary | ICD-10-CM | POA: Diagnosis not present

## 2017-03-10 DIAGNOSIS — D509 Iron deficiency anemia, unspecified: Secondary | ICD-10-CM | POA: Diagnosis not present

## 2017-03-10 DIAGNOSIS — Z992 Dependence on renal dialysis: Secondary | ICD-10-CM | POA: Diagnosis not present

## 2017-03-10 DIAGNOSIS — D472 Monoclonal gammopathy: Secondary | ICD-10-CM | POA: Diagnosis not present

## 2017-03-10 DIAGNOSIS — N186 End stage renal disease: Secondary | ICD-10-CM | POA: Diagnosis not present

## 2017-03-10 DIAGNOSIS — N2581 Secondary hyperparathyroidism of renal origin: Secondary | ICD-10-CM | POA: Diagnosis not present

## 2017-03-12 DIAGNOSIS — N2581 Secondary hyperparathyroidism of renal origin: Secondary | ICD-10-CM | POA: Diagnosis not present

## 2017-03-12 DIAGNOSIS — D472 Monoclonal gammopathy: Secondary | ICD-10-CM | POA: Diagnosis not present

## 2017-03-12 DIAGNOSIS — D509 Iron deficiency anemia, unspecified: Secondary | ICD-10-CM | POA: Diagnosis not present

## 2017-03-12 DIAGNOSIS — Z992 Dependence on renal dialysis: Secondary | ICD-10-CM | POA: Diagnosis not present

## 2017-03-12 DIAGNOSIS — N186 End stage renal disease: Secondary | ICD-10-CM | POA: Diagnosis not present

## 2017-03-15 DIAGNOSIS — Z992 Dependence on renal dialysis: Secondary | ICD-10-CM | POA: Diagnosis not present

## 2017-03-15 DIAGNOSIS — N186 End stage renal disease: Secondary | ICD-10-CM | POA: Diagnosis not present

## 2017-03-15 DIAGNOSIS — N2581 Secondary hyperparathyroidism of renal origin: Secondary | ICD-10-CM | POA: Diagnosis not present

## 2017-03-15 DIAGNOSIS — D509 Iron deficiency anemia, unspecified: Secondary | ICD-10-CM | POA: Diagnosis not present

## 2017-03-15 DIAGNOSIS — D472 Monoclonal gammopathy: Secondary | ICD-10-CM | POA: Diagnosis not present

## 2017-03-16 DIAGNOSIS — D649 Anemia, unspecified: Secondary | ICD-10-CM | POA: Diagnosis not present

## 2017-03-18 ENCOUNTER — Encounter (HOSPITAL_COMMUNITY): Payer: Self-pay | Admitting: Emergency Medicine

## 2017-03-18 ENCOUNTER — Emergency Department (HOSPITAL_COMMUNITY)
Admission: EM | Admit: 2017-03-18 | Discharge: 2017-03-19 | Disposition: A | Discharge status: Home / Self Care | Payer: Medicare Other | Attending: Emergency Medicine | Admitting: Emergency Medicine

## 2017-03-18 ENCOUNTER — Telehealth: Payer: Self-pay

## 2017-03-18 DIAGNOSIS — Y658 Other specified misadventures during surgical and medical care: Secondary | ICD-10-CM | POA: Diagnosis not present

## 2017-03-18 DIAGNOSIS — Z992 Dependence on renal dialysis: Secondary | ICD-10-CM | POA: Diagnosis not present

## 2017-03-18 DIAGNOSIS — B20 Human immunodeficiency virus [HIV] disease: Secondary | ICD-10-CM | POA: Insufficient documentation

## 2017-03-18 DIAGNOSIS — N186 End stage renal disease: Secondary | ICD-10-CM | POA: Diagnosis not present

## 2017-03-18 DIAGNOSIS — I132 Hypertensive heart and chronic kidney disease with heart failure and with stage 5 chronic kidney disease, or end stage renal disease: Secondary | ICD-10-CM | POA: Insufficient documentation

## 2017-03-18 DIAGNOSIS — Z8673 Personal history of transient ischemic attack (TIA), and cerebral infarction without residual deficits: Secondary | ICD-10-CM | POA: Diagnosis not present

## 2017-03-18 DIAGNOSIS — R2231 Localized swelling, mass and lump, right upper limb: Secondary | ICD-10-CM | POA: Diagnosis present

## 2017-03-18 DIAGNOSIS — I509 Heart failure, unspecified: Secondary | ICD-10-CM | POA: Insufficient documentation

## 2017-03-18 DIAGNOSIS — Z79899 Other long term (current) drug therapy: Secondary | ICD-10-CM | POA: Diagnosis not present

## 2017-03-18 DIAGNOSIS — Z87891 Personal history of nicotine dependence: Secondary | ICD-10-CM | POA: Insufficient documentation

## 2017-03-18 DIAGNOSIS — R229 Localized swelling, mass and lump, unspecified: Secondary | ICD-10-CM | POA: Diagnosis not present

## 2017-03-18 DIAGNOSIS — T82898A Other specified complication of vascular prosthetic devices, implants and grafts, initial encounter: Secondary | ICD-10-CM | POA: Insufficient documentation

## 2017-03-18 DIAGNOSIS — I8290 Acute embolism and thrombosis of unspecified vein: Secondary | ICD-10-CM | POA: Diagnosis not present

## 2017-03-18 LAB — I-STAT CHEM 8, ED
BUN: 40 mg/dL — AB (ref 6–20)
Calcium, Ion: 1.14 mmol/L — ABNORMAL LOW (ref 1.15–1.40)
Chloride: 99 mmol/L — ABNORMAL LOW (ref 101–111)
Creatinine, Ser: 11.3 mg/dL — ABNORMAL HIGH (ref 0.44–1.00)
Glucose, Bld: 69 mg/dL (ref 65–99)
HEMATOCRIT: 38 % (ref 36.0–46.0)
HEMOGLOBIN: 12.9 g/dL (ref 12.0–15.0)
POTASSIUM: 4.4 mmol/L (ref 3.5–5.1)
SODIUM: 138 mmol/L (ref 135–145)
TCO2: 31 mmol/L (ref 22–32)

## 2017-03-18 NOTE — ED Notes (Signed)
Report given to lisa, Therapist, sports at [facility].

## 2017-03-18 NOTE — Telephone Encounter (Signed)
We received a consult note from Dr. Lowanda Foster for a fistulagram. Patient will be seen by one of our providers before fistulagram. Appointment set up for 03/25/17 with Dr. Oneida Alar at 3:30 pm. I spoke with Lattie Haw, RN (patient's nurse) at [facility] and relayed all information regarding appointment.

## 2017-03-18 NOTE — Discharge Instructions (Signed)
Take your usual prescriptions as previously directed.  Call your regular Renal doctor tomorrow morning to schedule a follow up appointment to declot your fistula in the next 2 days.  Return to the Emergency Department immediately sooner if worsening.

## 2017-03-18 NOTE — ED Triage Notes (Signed)
Pt from [facility] for evaluation of right arm fistula swelling and lack of thrill.  Right arm is swollen and tight.  Pt states it is double the size of normal.  Last accessed Monday.

## 2017-03-18 NOTE — ED Provider Notes (Signed)
[facility] EMERGENCY DEPARTMENT Provider Note   CSN: [id number] Arrival date & time: 03/18/17  1834     History   Chief Complaint Chief Complaint  Patient presents with  . Vascular Access Problem    HPI [name] is a 67 y.o. female.  HPI Pt was seen at [facility]. Per NH report and pt, c/o gradual onset and persistence of constant RUE "swelling" and "lack of thrill" in her AV fistula. Pt states her last HD was Monday (3 days ago) and "it worked just fine." Pt states her arm is "double" her normal size. Denies tingling/numbness in extremity, no focal motor weakness, no fevers, no rash, no injury, no CP/SOB.   Past Medical History:  Diagnosis Date  . Anemia   . Anxiety   . Atrial flutter ([place]) 02/22/2015  . C. difficile colitis 10/10/11  . Chronic diarrhea   . Depression   . ESRD on hemodialysis ([place])   . HIV (human immunodeficiency virus infection) ([place])   . Hypertension   . Insomnia   . Intracranial hemorrhage ([place])   . Muscle weakness (generalized)   . Pulmonary HTN ([place])   . Traumatic hematoma of right upper arm 02/22/2015    Patient Active Problem List   Diagnosis Date Noted  . Anemia of chronic disease 04/17/2016  . Hepatitis B immune 01/28/2016  . Atrial flutter ([place]) 02/22/2015  . Hyperkalemia 02/20/2015  . Venous stenosis of right upper extremity 04/06/2014  . ESRD on dialysis ([place]) 04/06/2014  . Essential hypertension   . 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 Nausea with vomiting 12/15/2012  . CHF, chronic ([place]) 11/25/2012  . Atrial fibrillation ([place]) 05/02/2012  . Intracranial bleed ([place]) 12/28/2011  . Angioedema of lips 09/21/2011  . Cellulitis of breast 03/09/2011  . Erosive esophagitis 12/15/2010  . Mallory - Weiss tear 12/15/2010  . UGI bleed 12/13/2010  . DIARRHEA 04/12/2009  . Human immunodeficiency virus (HIV) disease ([place]) 11/15/2008  . Alcohol abuse 11/15/2008  . PANCREATITIS, HX OF 11/15/2008  . TOBACCO USE, QUIT 11/15/2008  .  PAIN IN JOINT, UPPER ARM 08/07/2008  . ABDOMINAL WALL HERNIA 04/26/2007  . BRONCHITIS, ACUTE 11/16/2006  . HYPOKALEMIA, HX OF 07/13/2006  . ANEMIA, NORMOCYTIC, CHRONIC 03/23/2006  . ANXIETY 03/23/2006  . DEPRESSION 03/23/2006  . Essential hypertension, malignant 03/23/2006  . GERD 03/23/2006  . PANCREATITIS 03/23/2006  . MASS, RIGHT AXILLA 03/23/2006  . HEADACHE 03/23/2006  . SYMPTOM, ENLARGEMENT, LYMPH NODES 03/23/2006  . SHINGLES, HX OF 03/23/2006  . HYSTERECTOMY, TOTAL, HX OF 03/23/2006    Past Surgical History:  Procedure Laterality Date  . A/V FISTULAGRAM N/A 04/17/2016   Procedure: A/V Fistulagram - Right Upper;  Surgeon: Waynetta Sandy, MD;  Location: [facility];  Service: Cardiovascular;  Laterality: N/A;  . BIOPSY THYROID    . Dialysis Shunts     previous one removed from left arm and now present one in right arm  . DIALYSIS/PERMA CATHETER INSERTION Right 04/17/2016   Procedure: dialysis Catheter Insertion central veinous;  Surgeon: Waynetta Sandy, MD;  Location: [place] [facility];  Service: Cardiovascular;  Laterality: Right;  . ESOPHAGOGASTRODUODENOSCOPY  12/15/2010   Procedure: ESOPHAGOGASTRODUODENOSCOPY (EGD);  Surgeon: Daneil Dolin, MD;  [facility];  Service: Endoscopy;  Laterality: N/A;  . EXCISION OF BREAST BIOPSY Right 05/04/2012   Procedure: EXCISION OF BREAST BIOPSY;  Surgeon: Donato Heinz, MD;  Location: [facility];  Service: General;  Laterality: Right;  Right Excisional Breast  Biopsy  . FISTULOGRAM Right 03/26/2014   Procedure: Right Arm Fistulogram with Venoplasty Right Subclavian Vein and Inominate Vein. Debridement Fistula Ulcer;  Surgeon: Conrad Egypt Lake-Leto, MD;  Location: [place];  Service: Vascular;  Laterality: Right;  . IR GENERIC HISTORICAL  05/19/2016   IR REMOVAL TUN CV CATH W/O FL 05/19/2016 Markus Daft, MD [facility]-INTER[facility] RAD  . PERIPHERAL VASCULAR BALLOON ANGIOPLASTY Right 04/17/2016   Procedure: Peripheral Vascular Balloon  Angioplasty;  Surgeon: Waynetta Sandy, MD;  Location: [place];  Service: Cardiovascular;  Laterality: Right;  Central segment AV Fistula  . SHUNTOGRAM Right 10/19/2013   Procedure: FISTULOGRAM;  Surgeon: Conrad Luzerne, MD;  Location: [facility] CATH LAB;  Service: Cardiovascular;  Laterality: Right;  . TONSILLECTOMY      OB History    Gravida Para Term Preterm AB Living   1 1 1     1    SAB TAB Ectopic Multiple Live Births                   Home Medications    Prior to Admission medications   Medication Sig Start Date End Date Taking? Authorizing Provider  acetaminophen (TYLENOL) 325 MG tablet Take 650 mg by mouth every 4 (four) hours as needed for moderate pain.    [provider]  albuterol (PROVENTIL HFA;VENTOLIN HFA) 108 (90 Base) MCG/ACT inhaler Inhale 2 puffs into the lungs every 4 (four) hours as needed for wheezing or shortness of breath.    [provider]  buPROPion (WELLBUTRIN XL) 150 MG 24 hr tablet Take 150 mg by mouth daily.    [provider]  cinacalcet (SENSIPAR) 30 MG tablet Take 30 mg by mouth daily.    [provider]  cyclobenzaprine (FLEXERIL) 5 MG tablet Take 5-10 mg by mouth every 12 (twelve) hours as needed for muscle spasms.     [provider]  dolutegravir (TIVICAY) 50 MG tablet Take 50 mg by mouth daily.    [provider]  epoetin alfa (EPOGEN,PROCRIT) 76720 UNIT/ML injection Inject 8,000 Units into the skin 3 (three) times a week. To be given at dialysis, Monday Wednesday and Friday    [provider]  fentaNYL (DURAGESIC - DOSED MCG/HR) 50 MCG/HR Apply one patch every 72 hours for pain. Remove old patch. External use only. Rotate sites. 02/28/15   Lauree Chandler, NP  lamivudine (EPIVIR) 100 MG tablet Take 50 mg by mouth daily.    [provider]  lidocaine-prilocaine (EMLA) cream Apply 1 application topically 3 (three) times a week. Apply 1 hour prior to dialysis access  site on Monday Wednesday and Friday    [provider]  LORazepam (ATIVAN) 0.5 MG tablet Take 0.5 tablets (0.25 mg total) by mouth every 6 (six) hours as needed for anxiety. Patient taking differently: Take 0.25 mg by mouth every 8 (eight) hours as needed for anxiety.  02/28/15   Lauree Chandler, NP  Melatonin 3 MG TABS Take 3 mg by mouth at bedtime.    [provider]  metoprolol (LOPRESSOR) 50 MG tablet Take 50 mg by mouth 2 (two) times daily.    [provider]  multivitamin (RENA-VIT) TABS tablet Take 1 tablet by mouth at bedtime.    [provider]  oxyCODONE (OXY IR/ROXICODONE) 5 MG immediate release tablet Take one tablet by mouth every 6 hours as needed for moderate pain Patient taking differently: Take 10 mg by mouth every 12 (twelve) hours as needed for  moderate pain.  02/26/15   Lauree Chandler, NP  pantoprazole (PROTONIX) 40 MG tablet Take 40 mg by mouth daily.    [provider]  promethazine (PHENERGAN) 12.5 MG tablet Take 12.5 mg by mouth 3 (three) times a week.    [provider]  promethazine (PHENERGAN) 12.5 MG tablet Take 12.5 mg by mouth every 6 (six) hours as needed for nausea or vomiting.    [provider]  sertraline (ZOLOFT) 100 MG tablet Take 150 mg by mouth daily.     [provider]  sevelamer carbonate (RENVELA) 800 MG tablet Take 2,400 mg by mouth 3 (three) times daily with meals.     [provider]  sildenafil (REVATIO) 20 MG tablet Take 20 mg by mouth 3 (three) times daily.     [provider]  vitamin C (ASCORBIC ACID) 500 MG tablet Take 500 mg by mouth 2 (two) times daily.    [provider]  zaleplon (SONATA) 5 MG capsule Take one capsule by mouth every night at bedtime for rest Patient taking differently: Take 10 mg by mouth at bedtime.  02/19/15   Lauree Chandler, NP  zidovudine (RETROVIR) 100 MG capsule Take 100 mg by mouth 3 (three) times daily.       [provider]    Family History Family History  Problem Relation Age of Onset  . Anesthesia problems Neg Hx   . Hypotension Neg Hx   . Malignant hyperthermia Neg Hx   . Pseudochol deficiency Neg Hx     Social History Social History   Tobacco Use  . Smoking status: Former Smoker    Last attempt to quit: 03/12/1983    Years since quitting: 34.0  . Smokeless tobacco: Never Used  Substance Use Topics  . Alcohol use: No    Alcohol/week: 0.0 oz  . Drug use: No     Allergies   Latex and Penicillins   Review of Systems Review of Systems ROS: Statement: All systems negative except as marked or noted in the HPI; Constitutional: Negative for fever and chills. ; ; Eyes: Negative for eye pain, redness and discharge. ; ; ENMT: Negative for ear pain, hoarseness, nasal congestion, sinus pressure and sore throat. ; ; Cardiovascular: Negative for chest pain, palpitations, diaphoresis, dyspnea. ; ; Respiratory: Negative for cough, wheezing and stridor. ; ; Gastrointestinal: Negative for nausea, vomiting, diarrhea, abdominal pain, blood in stool, hematemesis, jaundice and rectal bleeding. . ; ; Genitourinary: Negative for dysuria, flank pain and hematuria. ; ; Musculoskeletal: +RUE edema. Negative for back pain and neck pain. Negative for swelling and trauma.; ; Skin: Negative for pruritus, rash, abrasions, blisters, bruising and skin lesion.; ; Neuro: Negative for headache, lightheadedness and neck stiffness. Negative for weakness, altered level of consciousness, altered mental status, extremity weakness, paresthesias, involuntary movement, seizure and syncope.       Physical Exam Updated Vital Signs BP (!) 189/103 (BP Location: Left Leg)   Pulse 81   Temp 98.2 F (36.8 C) (Oral)   Resp 14   Ht 5\' 2"  (1.575 m)   Wt 47 kg (103 lb 9.9 oz)   SpO2 100%   BMI 18.95 kg/m   BP 122/81 (BP Location: Left Leg)   Pulse 79   Temp 98.2 F (36.8 C) (Oral)   Resp 15   Ht 5\' 2"  (1.575  m)   Wt 47 kg (103 lb 9.9 oz)   SpO2 100%   BMI 18.95 kg/m  Physical Exam 1850: Physical examination:  Nursing notes reviewed; Vital signs and O2 SAT reviewed;  Constitutional: Well developed, Well nourished, Well hydrated, In no acute distress; Head:  Normocephalic, atraumatic; Eyes: EOMI, PERRL, No scleral icterus; ENMT: Mouth and pharynx normal, Mucous membranes moist; Neck: Supple, Full range of motion, No lymphadenopathy; Cardiovascular: Regular rate and rhythm, No gallop; Respiratory: Breath sounds clear & equal bilaterally, No wheezes.  Speaking full sentences with ease, Normal respiratory effort/excursion; Chest: Nontender, Movement normal; Abdomen: Soft, Nontender, Nondistended, Normal bowel sounds; Genitourinary: No CVA tenderness; Extremities: LUE fistulas are non-functioning per hx. Right arm upper fistula is without palp thrill. RUE edematous compared to LUE.  No deformity. No open wounds/bleeding. No tenderness, No edema, No calf edema or asymmetry.; Neuro: AA&Ox3, Major CN grossly intact.  Speech clear. No gross focal motor deficits in extremities.; Skin: Color normal, Warm, Dry.   ED Treatments / Results  Labs (all labs ordered are listed, but only abnormal results are displayed)   EKG  EKG Interpretation None       Radiology   Procedures Procedures (including critical care time)  Medications Ordered in ED Medications - No data to display   Initial Impression / Assessment and Plan / ED Course  I have reviewed the triage vital signs and the nursing notes.  Pertinent labs & imaging results that were available during my care of the patient were reviewed by me and considered in my medical decision making (see chart for details).  MDM Reviewed: previous chart, nursing note and vitals Reviewed previous: labs Interpretation: labs   Results for orders placed or performed during the hospital encounter of 03/18/17  I-stat Chem 8, ED  Result Value Ref Range    Sodium 138 135 - 145 mmol/L   Potassium 4.4 3.5 - 5.1 mmol/L   Chloride 99 (L) 101 - 111 mmol/L   BUN 40 (H) 6 - 20 mg/dL   Creatinine, Ser 11.30 (H) 0.44 - 1.00 mg/dL   Glucose, Bld 69 65 - 99 mg/dL   Calcium, Ion 1.14 (L) 1.15 - 1.40 mmol/L   TCO2 31 22 - 32 mmol/L   Hemoglobin 12.9 12.0 - 15.0 g/dL   HCT 38.0 36.0 - 46.0 %    1950:  T/C to Vascular Dr. Bridgett Larsson, case discussed, including:  HPI, pertinent PM/SHx, VS/PE, dx testing, ED course and treatment:  States pt can be discharged back to facility and have them call her Renal MD tomorrow morning to schedule outpatient declotting.   2005:  T/C to Renal Dr. Gillian Scarce, case discussed, including:  HPI, pertinent PM/SHx, VS/PE, dx testing, ED course and treatment:  Pt does not need emergent HD at this time, can be d/c home and call her Renal MD tomorrow morning. Dx and testing d/w pt.  Questions answered.  Verb understanding, agreeable to d/c back to [facility] with outpt f/u.     Final Clinical Impressions(s) / ED Diagnoses   Final diagnoses:  None    ED Discharge Orders    None       Francine Graven, DO 03/21/17 1312

## 2017-03-19 DIAGNOSIS — D509 Iron deficiency anemia, unspecified: Secondary | ICD-10-CM | POA: Diagnosis not present

## 2017-03-19 DIAGNOSIS — D472 Monoclonal gammopathy: Secondary | ICD-10-CM | POA: Diagnosis not present

## 2017-03-19 DIAGNOSIS — Z992 Dependence on renal dialysis: Secondary | ICD-10-CM | POA: Diagnosis not present

## 2017-03-19 DIAGNOSIS — N2581 Secondary hyperparathyroidism of renal origin: Secondary | ICD-10-CM | POA: Diagnosis not present

## 2017-03-19 DIAGNOSIS — N186 End stage renal disease: Secondary | ICD-10-CM | POA: Diagnosis not present

## 2017-03-22 DIAGNOSIS — N186 End stage renal disease: Secondary | ICD-10-CM | POA: Diagnosis not present

## 2017-03-22 DIAGNOSIS — R2230 Localized swelling, mass and lump, unspecified upper limb: Secondary | ICD-10-CM | POA: Diagnosis not present

## 2017-03-22 DIAGNOSIS — B20 Human immunodeficiency virus [HIV] disease: Secondary | ICD-10-CM | POA: Diagnosis not present

## 2017-03-22 DIAGNOSIS — T82898A Other specified complication of vascular prosthetic devices, implants and grafts, initial encounter: Secondary | ICD-10-CM | POA: Diagnosis not present

## 2017-03-24 DIAGNOSIS — D472 Monoclonal gammopathy: Secondary | ICD-10-CM | POA: Diagnosis not present

## 2017-03-24 DIAGNOSIS — N186 End stage renal disease: Secondary | ICD-10-CM | POA: Diagnosis not present

## 2017-03-24 DIAGNOSIS — D509 Iron deficiency anemia, unspecified: Secondary | ICD-10-CM | POA: Diagnosis not present

## 2017-03-24 DIAGNOSIS — Z992 Dependence on renal dialysis: Secondary | ICD-10-CM | POA: Diagnosis not present

## 2017-03-24 DIAGNOSIS — N2581 Secondary hyperparathyroidism of renal origin: Secondary | ICD-10-CM | POA: Diagnosis not present

## 2017-03-25 ENCOUNTER — Encounter: Payer: Self-pay | Admitting: *Deleted

## 2017-03-25 ENCOUNTER — Other Ambulatory Visit: Payer: Self-pay

## 2017-03-25 ENCOUNTER — Ambulatory Visit (INDEPENDENT_AMBULATORY_CARE_PROVIDER_SITE_OTHER): Payer: Medicare Other | Admitting: Vascular Surgery

## 2017-03-25 ENCOUNTER — Encounter: Payer: Self-pay | Admitting: Vascular Surgery

## 2017-03-25 ENCOUNTER — Other Ambulatory Visit: Payer: Self-pay | Admitting: *Deleted

## 2017-03-25 VITALS — BP 117/75 | HR 64 | Temp 98.3°F | Resp 14 | Ht 62.0 in | Wt 104.0 lb

## 2017-03-25 DIAGNOSIS — N186 End stage renal disease: Secondary | ICD-10-CM

## 2017-03-25 DIAGNOSIS — M7989 Other specified soft tissue disorders: Secondary | ICD-10-CM

## 2017-03-25 DIAGNOSIS — Z992 Dependence on renal dialysis: Secondary | ICD-10-CM

## 2017-03-25 NOTE — Progress Notes (Signed)
HISTORY AND PHYSICAL     CC:  Arm swelling - right Requesting Provider:  Fran Lowes, MD  HPI: This is a 67 y.o. female pt with ESRD with right arm fistula who had arm swelling ~ a year ago and underwent fistulogram with balloon angioplasty of her right subclavian/inominate vein for occlusion on 04/17/16 by Dr. Donzetta Matters.  She did have a right femoral TDC placed at that time that has since been removed.   She presents today with a 3 week hx of right arm swelling.   She dialyzes M/W/F and the fistula has been usable.    She is a resident of [facility] in [place].    She is on beta blocker for blood pressur management.  She has hx of HIV.  She is on medications for depression/anxiety.  She denies being on blood thinners.  Past Medical History:  Diagnosis Date  . Anemia   . Anxiety   . Atrial flutter ([place]) 02/22/2015  . C. difficile colitis 10/10/11  . Chronic diarrhea   . Depression   . ESRD on hemodialysis ([place])   . HIV (human immunodeficiency virus infection) ([place])   . Hypertension   . Insomnia   . Intracranial hemorrhage ([place])   . Muscle weakness (generalized)   . Pulmonary HTN ([place])   . Traumatic hematoma of right upper arm 02/22/2015    Past Surgical History:  Procedure Laterality Date  . A/V FISTULAGRAM N/A 04/17/2016   Procedure: A/V Fistulagram - Right Upper;  Surgeon: Waynetta Sandy, MD;  Location: [place] [facility];  Service: Cardiovascular;  Laterality: N/A;  . BIOPSY THYROID    . Dialysis Shunts     previous one removed from left arm and now present one in right arm  . DIALYSIS/PERMA CATHETER INSERTION Right 04/17/2016   Procedure: dialysis Catheter Insertion central veinous;  Surgeon: Waynetta Sandy, MD;  Location: [place] [facility];  Service: Cardiovascular;  Laterality: Right;  . ESOPHAGOGASTRODUODENOSCOPY  12/15/2010   Procedure: ESOPHAGOGASTRODUODENOSCOPY (EGD);  Surgeon: Daneil Dolin, MD;  Location[facility];  Service:  Endoscopy;  Laterality: N/A;  . EXCISION OF BREAST BIOPSY Right 05/04/2012   Procedure: EXCISION OF BREAST BIOPSY;  Surgeon: Donato Heinz, MD;  Location: [facility];  Service: General;  Laterality: Right;  Right Excisional Breast Biopsy  . FISTULOGRAM Right 03/26/2014   Procedure: Right Arm Fistulogram with Venoplasty Right Subclavian Vein and Inominate Vein. Debridement Fistula Ulcer;  Surgeon: Conrad Easton, MD;  Location: [facility];  Service: Vascular;  Laterality: Right;  . IR GENERIC HISTORICAL  05/19/2016   IR REMOVAL TUN CV CATH W/O FL 05/19/2016 Markus Daft, MD [facility]  . PERIPHERAL VASCULAR BALLOON ANGIOPLASTY Right 04/17/2016   Procedure: Peripheral Vascular Balloon Angioplasty;  Surgeon: Waynetta Sandy, MD;  Location: [place] [facility];  Service: Cardiovascular;  Laterality: Right;  Central segment AV Fistula  . SHUNTOGRAM Right 10/19/2013   Procedure: FISTULOGRAM;  Surgeon: Conrad Floyd, MD;  Location: [facility];  Service: Cardiovascular;  Laterality: Right;  . TONSILLECTOMY      Allergies  Allergen Reactions  . Latex Rash and Itching  . Penicillins Rash and Swelling    Has patient had a PCN reaction causing immediate rash, facial/tongue/throat swelling, SOB or lightheadedness with hypotension: No Has patient had a PCN reaction causing severe rash involving mucus membranes or skin necrosis: No Has patient had a PCN reaction that required hospitalization No Has patient had a PCN reaction  occurring within the last 10 years: No If all of the above answers are "NO", then may proceed with Cephalosporin use.      Current Outpatient Medications  Medication Sig Dispense Refill  . acetaminophen (TYLENOL) 325 MG tablet Take 650 mg by mouth every 4 (four) hours as needed for moderate pain.    Marland Kitchen albuterol (PROVENTIL HFA;VENTOLIN HFA) 108 (90 Base) MCG/ACT inhaler Inhale 2 puffs into the lungs every 4 (four) hours as needed for wheezing or shortness of breath.    Marland Kitchen buPROPion  (WELLBUTRIN XL) 150 MG 24 hr tablet Take 150 mg by mouth daily.    . cinacalcet (SENSIPAR) 30 MG tablet Take 30 mg by mouth daily.    . cyclobenzaprine (FLEXERIL) 5 MG tablet Take 5-10 mg by mouth every 12 (twelve) hours as needed for muscle spasms.     . dolutegravir (TIVICAY) 50 MG tablet Take 50 mg by mouth daily.    Marland Kitchen epoetin alfa (EPOGEN,PROCRIT) 31517 UNIT/ML injection Inject 8,000 Units into the skin 3 (three) times a week. To be given at dialysis, Monday Wednesday and Friday    . fentaNYL (DURAGESIC - DOSED MCG/HR) 50 MCG/HR Apply one patch every 72 hours for pain. Remove old patch. External use only. Rotate sites. 10 patch 0  . lamivudine (EPIVIR) 100 MG tablet Take 50 mg by mouth daily.    Marland Kitchen lidocaine-prilocaine (EMLA) cream Apply 1 application topically 3 (three) times a week. Apply 1 hour prior to dialysis access site on Monday Wednesday and Friday    . LORazepam (ATIVAN) 0.5 MG tablet Take 0.5 tablets (0.25 mg total) by mouth every 6 (six) hours as needed for anxiety. (Patient taking differently: Take 0.25 mg by mouth every 8 (eight) hours as needed for anxiety. ) 60 tablet 5  . Melatonin 3 MG TABS Take 3 mg by mouth at bedtime.    . metoprolol (LOPRESSOR) 50 MG tablet Take 50 mg by mouth 2 (two) times daily.    . multivitamin (RENA-VIT) TABS tablet Take 1 tablet by mouth at bedtime.    Marland Kitchen oxyCODONE (OXY IR/ROXICODONE) 5 MG immediate release tablet Take one tablet by mouth every 6 hours as needed for moderate pain (Patient taking differently: Take 10 mg by mouth every 12 (twelve) hours as needed for moderate pain. ) 120 tablet 0  . pantoprazole (PROTONIX) 40 MG tablet Take 40 mg by mouth daily.    . promethazine (PHENERGAN) 12.5 MG tablet Take 12.5 mg by mouth 3 (three) times a week.    . promethazine (PHENERGAN) 12.5 MG tablet Take 12.5 mg by mouth every 6 (six) hours as needed for nausea or vomiting.    . sertraline (ZOLOFT) 100 MG tablet Take 150 mg by mouth daily.     . sevelamer  carbonate (RENVELA) 800 MG tablet Take 2,400 mg by mouth 3 (three) times daily with meals.     . sildenafil (REVATIO) 20 MG tablet Take 20 mg by mouth 3 (three) times daily.     . vitamin C (ASCORBIC ACID) 500 MG tablet Take 500 mg by mouth 2 (two) times daily.    . zaleplon (SONATA) 5 MG capsule Take one capsule by mouth every night at bedtime for rest (Patient taking differently: Take 10 mg by mouth at bedtime. ) 30 capsule 0  . zidovudine (RETROVIR) 100 MG capsule Take 100 mg by mouth 3 (three) times daily.       No current facility-administered medications for this visit.     Family  History  Problem Relation Age of Onset  . Anesthesia problems Neg Hx   . Hypotension Neg Hx   . Malignant hyperthermia Neg Hx   . Pseudochol deficiency Neg Hx     Social History   Socioeconomic History  . Marital status: Widowed    Spouse name: Not on file  . Number of children: Not on file  . Years of education: Not on file  . Highest education level: Not on file  Social Needs  . Financial resource strain: Not on file  . Food insecurity - worry: Not on file  . Food insecurity - inability: Not on file  . Transportation needs - medical: Not on file  . Transportation needs - non-medical: Not on file  Occupational History  . Not on file  Tobacco Use  . Smoking status: Former Smoker    Last attempt to quit: 03/12/1983    Years since quitting: 34.0  . Smokeless tobacco: Never Used  Substance and Sexual Activity  . Alcohol use: No    Alcohol/week: 0.0 oz  . Drug use: No  . Sexual activity: Not Currently  Other Topics Concern  . Not on file  Social History Narrative  . Not on file     REVIEW OF SYSTEMS:   [X]  denotes positive finding, [ ]  denotes negative finding Cardiac  Comments:  Chest pain or chest pressure:    Shortness of breath upon exertion:    Short of breath when lying flat:    Irregular heart rhythm:        Vascular    Pain in calf, thigh, or hip brought on by ambulation:     Pain in feet at night that wakes you up from your sleep:     Blood clot in your veins:    Right arm swelling:  x       Pulmonary    Oxygen at home:    Productive cough:     Wheezing:         Neurologic    Sudden weakness in arms or legs:     Sudden numbness in arms or legs:     Sudden onset of difficulty speaking or slurred speech:    Temporary loss of vision in one eye:     Problems with dizziness:         Gastrointestinal    Blood in stool:     Vomited blood:         Genitourinary    Burning when urinating:     Blood in urine:        Psychiatric    Major depression:         Hematologic    Bleeding problems:    Problems with blood clotting too easily:        Skin    Rashes or ulcers:        Constitutional    Fever or chills:      PHYSICAL EXAMINATION:  Vitals:   03/25/17 1600  BP: 117/75  Pulse: 64  Resp: 14  Temp: 98.3 F (36.8 C)  SpO2: 99%   Vitals:   03/25/17 1600  Weight: 104 lb (47.2 kg)  Height: 5\' 2"  (1.575 m)   Body mass index is 19.02 kg/m.  General:  WDWN in NAD; vital signs documented above Gait: In wheelchair HENT: WNL, normocephalic Pulmonary: normal non-labored breathing  Skin: without rashes Vascular:  Right arm fistula is pulsatile.  She does have right arm swelling.  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   Non-Invasive Vascular Imaging:   none  Pt meds includes: Statin:  No. Beta Blocker:  No. Aspirin:  No. ACEI:  No. ARB:  No. CCB use:  No Other Antiplatelet/Anticoagulant:  No   ASSESSMENT/PLAN:: 67 y.o. female with ESRD dialyzing on M/W/F now with recurrent right arm swelling   -pt's right arm fistula is  pulsatile and she has right arm swelling.  In February, she had undergone fistulogram with balloon angioplasty of the subclavian/inominate veins.  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  We will schedule her for fistulogram on Monday March 29, 2017 with possible intervention with Dr. Donzetta Matters.  Dr. Oneida Alar discussed with pt if the intervention is not successful, we may need to ligate this fistula and start new.  This may entail new access as a thigh graft.  -will need to reschedule her dialysis on Monday to undergo procedure.    Leontine Locket, PA-C [facility] [phone]  Clinic MD:  Pt seen and examined with Dr. Oneida Alar  History and exam details as above.  Patient with known prior history of subclavian and innominate vein stenosis treated with angioplasty approximately 1 year ago.  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  She has abundant collaterals on the right chest wall and right upper extremity swelling.  The fistula is still patent.  I discussed with the patient today the possibility of trying to angioplasty her central veins again to improve her swelling symptoms.  However also discussed with her that Ms. this may not be possible if that was the case that she would end up needing fistula ligation and consideration for a thigh graft.  However, I would not ligate the right upper extremity fistula until we have a functioning access to try to avoid catheter placement.  The patient's central venogram will be scheduled with Dr. Donzetta Matters on Monday, March 29, 2017.  Risk benefits possible complications and procedure details were discussed with the patient today.  She will be coming from [facility] skilled nursing facility.  Ruta Hinds, MD [facility] [place] Office: [phone] Pager: [phone]

## 2017-03-26 DIAGNOSIS — N186 End stage renal disease: Secondary | ICD-10-CM | POA: Diagnosis not present

## 2017-03-26 DIAGNOSIS — N2581 Secondary hyperparathyroidism of renal origin: Secondary | ICD-10-CM | POA: Diagnosis not present

## 2017-03-26 DIAGNOSIS — D472 Monoclonal gammopathy: Secondary | ICD-10-CM | POA: Diagnosis not present

## 2017-03-26 DIAGNOSIS — Z992 Dependence on renal dialysis: Secondary | ICD-10-CM | POA: Diagnosis not present

## 2017-03-26 DIAGNOSIS — D509 Iron deficiency anemia, unspecified: Secondary | ICD-10-CM | POA: Diagnosis not present

## 2017-03-26 NOTE — Progress Notes (Signed)
Several failed attempts to fax pre-op instructions to [place], LPN at [facility] and [place]. Left instructions for nurse to contact nursing home for correct fax number and to fax pre-op instructions.

## 2017-03-26 NOTE — Pre-Procedure Instructions (Signed)
    [name]  03/26/2017     No Pharmacies Listed   Your procedure is scheduled on Monday, March 29, 2017  Report to [facility] Admitting at 8:30 A.M.  Call this number if you have problems the morning of surgery:  [phone]   Remember:  Do not eat food or drink liquids after midnight Sunday, March 28, 2017  Take these medicines the morning of surgery with A SIP OF WATER: buPROPion (WELLBUTRIN XL), metoprolol (LOPRESSOR),  pantoprazole (PROTONIX), sertraline (ZOLOFT), sildenafil (REVATIO),  If needed: promethazine (PHENERGAN) 12.5 MG tablet for nausea and vomiting, LORazepam (ATIVAN) for anxiety, pain medication (Tylenol or Oxycodone), albuterol (PROVENTIL HFA;VENTOLIN HFA)  Inhaler for wheezing or shortness of breath (Bring inhaler in with you on day of surgery) Stop taking vitamins, fish oil, Melatonin and herbal medications. Do not take any NSAIDs ie: Ibuprofen, Advil, Naproxen (Aleve), Motrin, BC and Goody Powder; stop now.  Do not wear jewelry, make-up or nail polish.  Do not wear lotions, powders, or perfumes, or deodorant.  Do not shave 48 hours prior to surgery.    Do not bring valuables to the hospital.  [facility] is not responsible for any belongings or valuables.  Contacts, dentures or bridgework may not be worn into surgery.  Leave your suitcase in the car.  After surgery it may be brought to your room. For patients admitted to the hospital, discharge time will be determined by your treatment team. Patients discharged the day of surgery will not be allowed to drive home.  Please read over the following fact sheets that you were given.

## 2017-03-27 NOTE — Pre-Procedure Instructions (Signed)
The below instructions (typed up by my co-worker) were given verbally to Dedra Skeens, Therapist, sports at [facility]. In addition I requested a MAR be printed and sent after morning medications were given, H&P, and any cardiac records.   [name]  03/27/2017     No Pharmacies Listed   Your procedure is scheduled on Monday, March 29, 2017  Report to [facility] Admitting at 8:30 A.M.  Call this number if you have problems the morning of surgery:  [phone]   Remember:  Do not eat food or drink liquids after midnight Sunday, March 28, 2017  Take these medicines the morning of surgery with A SIP OF WATER: buPROPion (WELLBUTRIN XL), metoprolol (LOPRESSOR),  pantoprazole (PROTONIX), sertraline (ZOLOFT), sildenafil (REVATIO),  If needed: promethazine (PHENERGAN) 12.5 MG tablet for nausea and vomiting, LORazepam (ATIVAN) for anxiety, pain medication (Tylenol or Oxycodone), albuterol (PROVENTIL HFA;VENTOLIN HFA)  Inhaler for wheezing or shortness of breath (Bring inhaler in with you on day of surgery) Stop taking vitamins, fish oil, Melatonin and herbal medications. Do not take any NSAIDs ie: Ibuprofen, Advil, Naproxen (Aleve), Motrin, BC and Goody Powder; stop now.  Do not wear jewelry, make-up or nail polish.  Do not wear lotions, powders, or perfumes, or deodorant.  Do not shave 48 hours prior to surgery.    Do not bring valuables to the hospital.  [facility] is not responsible for any belongings or valuables.  Contacts, dentures or bridgework may not be worn into surgery.  Leave your suitcase in the car.  After surgery it may be brought to your room. For patients admitted to the hospital, discharge time will be determined by your treatment team. Patients discharged the day of surgery will not be allowed to drive home.  Please read over the following fact sheets that you were given.

## 2017-03-29 ENCOUNTER — Ambulatory Visit (HOSPITAL_COMMUNITY): Payer: Medicare Other | Admitting: Certified Registered"

## 2017-03-29 ENCOUNTER — Ambulatory Visit (HOSPITAL_COMMUNITY)
Admission: RE | Admit: 2017-03-29 | Discharge: 2017-03-29 | Disposition: A | Discharge status: Skilled Nursing Facility | Payer: Medicare Other | Source: Ambulatory Visit | Attending: Vascular Surgery | Admitting: Vascular Surgery

## 2017-03-29 ENCOUNTER — Encounter (HOSPITAL_COMMUNITY)
Admission: RE | Disposition: A | Discharge status: Skilled Nursing Facility | Payer: Self-pay | Source: Ambulatory Visit | Attending: Vascular Surgery

## 2017-03-29 ENCOUNTER — Encounter (HOSPITAL_COMMUNITY): Payer: Self-pay

## 2017-03-29 DIAGNOSIS — N186 End stage renal disease: Secondary | ICD-10-CM | POA: Diagnosis not present

## 2017-03-29 DIAGNOSIS — B20 Human immunodeficiency virus [HIV] disease: Secondary | ICD-10-CM | POA: Insufficient documentation

## 2017-03-29 DIAGNOSIS — F419 Anxiety disorder, unspecified: Secondary | ICD-10-CM | POA: Diagnosis not present

## 2017-03-29 DIAGNOSIS — F329 Major depressive disorder, single episode, unspecified: Secondary | ICD-10-CM | POA: Insufficient documentation

## 2017-03-29 DIAGNOSIS — K219 Gastro-esophageal reflux disease without esophagitis: Secondary | ICD-10-CM | POA: Diagnosis not present

## 2017-03-29 DIAGNOSIS — T82590A Other mechanical complication of surgically created arteriovenous fistula, initial encounter: Secondary | ICD-10-CM | POA: Diagnosis not present

## 2017-03-29 DIAGNOSIS — Z992 Dependence on renal dialysis: Secondary | ICD-10-CM | POA: Insufficient documentation

## 2017-03-29 DIAGNOSIS — Y832 Surgical operation with anastomosis, bypass or graft as the cause of abnormal reaction of the patient, or of later complication, without mention of misadventure at the time of the procedure: Secondary | ICD-10-CM | POA: Diagnosis not present

## 2017-03-29 DIAGNOSIS — T82858A Stenosis of vascular prosthetic devices, implants and grafts, initial encounter: Secondary | ICD-10-CM | POA: Insufficient documentation

## 2017-03-29 DIAGNOSIS — M7989 Other specified soft tissue disorders: Secondary | ICD-10-CM | POA: Diagnosis present

## 2017-03-29 DIAGNOSIS — I120 Hypertensive chronic kidney disease with stage 5 chronic kidney disease or end stage renal disease: Secondary | ICD-10-CM | POA: Insufficient documentation

## 2017-03-29 DIAGNOSIS — I4891 Unspecified atrial fibrillation: Secondary | ICD-10-CM | POA: Diagnosis not present

## 2017-03-29 DIAGNOSIS — Z79899 Other long term (current) drug therapy: Secondary | ICD-10-CM | POA: Insufficient documentation

## 2017-03-29 DIAGNOSIS — I340 Nonrheumatic mitral (valve) insufficiency: Secondary | ICD-10-CM | POA: Insufficient documentation

## 2017-03-29 DIAGNOSIS — Z87891 Personal history of nicotine dependence: Secondary | ICD-10-CM | POA: Insufficient documentation

## 2017-03-29 DIAGNOSIS — T82898A Other specified complication of vascular prosthetic devices, implants and grafts, initial encounter: Secondary | ICD-10-CM | POA: Diagnosis not present

## 2017-03-29 HISTORY — PX: FISTULOGRAM: SHX5832

## 2017-03-29 LAB — POCT I-STAT 4, (NA,K, GLUC, HGB,HCT)
Glucose, Bld: 78 mg/dL (ref 65–99)
HCT: 36 % (ref 36.0–46.0)
Hemoglobin: 12.2 g/dL (ref 12.0–15.0)
POTASSIUM: 4.8 mmol/L (ref 3.5–5.1)
Sodium: 141 mmol/L (ref 135–145)

## 2017-03-29 SURGERY — FISTULOGRAM
Anesthesia: Monitor Anesthesia Care | Site: Arm Upper | Laterality: Right

## 2017-03-29 MED ORDER — LIDOCAINE-EPINEPHRINE (PF) 1 %-1:200000 IJ SOLN
INTRAMUSCULAR | Status: DC | PRN
  Administered 2017-03-29: 2 mL

## 2017-03-29 MED ORDER — FENTANYL CITRATE (PF) 100 MCG/2ML IJ SOLN
25.0000 ug | INTRAMUSCULAR | Status: DC | PRN
  Administered 2017-03-29 (×2): 25 ug via INTRAVENOUS

## 2017-03-29 MED ORDER — VANCOMYCIN HCL IN DEXTROSE 1-5 GM/200ML-% IV SOLN
1000.0000 mg | INTRAVENOUS | Status: AC
  Administered 2017-03-29: 1000 mg via INTRAVENOUS
  Filled 2017-03-29: qty 200

## 2017-03-29 MED ORDER — FENTANYL CITRATE (PF) 100 MCG/2ML IJ SOLN
INTRAMUSCULAR | Status: DC | PRN
  Administered 2017-03-29: 50 ug via INTRAVENOUS
  Administered 2017-03-29 (×2): 25 ug via INTRAVENOUS

## 2017-03-29 MED ORDER — ONDANSETRON HCL 4 MG/2ML IJ SOLN: 4.0000 mg | Freq: Once | INTRAMUSCULAR | Status: DC | PRN

## 2017-03-29 MED ORDER — LIDOCAINE 2% (20 MG/ML) 5 ML SYRINGE
INTRAMUSCULAR | Status: DC | PRN
  Administered 2017-03-29: 60 mg via INTRAVENOUS

## 2017-03-29 MED ORDER — PROPOFOL 10 MG/ML IV BOLUS
INTRAVENOUS | Status: DC | PRN
  Administered 2017-03-29: 20 mg via INTRAVENOUS

## 2017-03-29 MED ORDER — 0.9 % SODIUM CHLORIDE (POUR BTL) OPTIME
TOPICAL | Status: DC | PRN
  Administered 2017-03-29: 1000 mL

## 2017-03-29 MED ORDER — FENTANYL CITRATE (PF) 250 MCG/5ML IJ SOLN
INTRAMUSCULAR | Status: AC
Start: 2017-03-29 — End: 2017-03-29
  Filled 2017-03-29: qty 5

## 2017-03-29 MED ORDER — HEPARIN SODIUM (PORCINE) 1000 UNIT/ML IJ SOLN
INTRAMUSCULAR | Status: DC | PRN
  Administered 2017-03-29: 3000 [IU] via INTRAVENOUS

## 2017-03-29 MED ORDER — PROPOFOL 500 MG/50ML IV EMUL
INTRAVENOUS | Status: DC | PRN
  Administered 2017-03-29: 75 ug/kg/min via INTRAVENOUS

## 2017-03-29 MED ORDER — SODIUM CHLORIDE 0.9 % IV SOLN
INTRAVENOUS | Status: DC
  Administered 2017-03-29 (×2): via INTRAVENOUS

## 2017-03-29 MED ORDER — FENTANYL CITRATE (PF) 100 MCG/2ML IJ SOLN
INTRAMUSCULAR | Status: AC
  Administered 2017-03-29: 25 ug via INTRAVENOUS
  Filled 2017-03-29: qty 2

## 2017-03-29 MED ORDER — CLOPIDOGREL BISULFATE 300 MG PO TABS: 300.0000 mg | ORAL_TABLET | Freq: Once | ORAL | Status: DC

## 2017-03-29 MED ORDER — METOPROLOL TARTRATE 12.5 MG HALF TABLET
50.0000 mg | ORAL_TABLET | Freq: Every day | ORAL | Status: DC
  Administered 2017-03-29: 50 mg via ORAL
  Filled 2017-03-29: qty 4

## 2017-03-29 MED ORDER — ONDANSETRON HCL 4 MG/2ML IJ SOLN
INTRAMUSCULAR | Status: AC
  Filled 2017-03-29: qty 2

## 2017-03-29 MED ORDER — PROPOFOL 10 MG/ML IV BOLUS
INTRAVENOUS | Status: AC
  Filled 2017-03-29: qty 20

## 2017-03-29 MED ORDER — SODIUM CHLORIDE 0.9 % IV SOLN
INTRAVENOUS | Status: DC | PRN
  Administered 2017-03-29: 20 ug/min via INTRAVENOUS

## 2017-03-29 MED ORDER — DEXAMETHASONE SODIUM PHOSPHATE 10 MG/ML IJ SOLN
INTRAMUSCULAR | Status: AC
  Filled 2017-03-29: qty 1

## 2017-03-29 MED ORDER — IODIXANOL 320 MG/ML IV SOLN
INTRAVENOUS | Status: DC | PRN
  Administered 2017-03-29: 95 mL via INTRA_ARTERIAL

## 2017-03-29 MED ORDER — CLOPIDOGREL BISULFATE 75 MG PO TABS: 75.0000 mg | ORAL_TABLET | Freq: Every day | ORAL | 3 refills | Status: DC

## 2017-03-29 MED ORDER — OXYCODONE HCL 5 MG PO TABS
ORAL_TABLET | ORAL | Status: AC
  Filled 2017-03-29: qty 1

## 2017-03-29 MED ORDER — OXYCODONE HCL 5 MG PO TABS
5.0000 mg | ORAL_TABLET | Freq: Once | ORAL | Status: AC
  Administered 2017-03-29: 5 mg via ORAL

## 2017-03-29 MED ORDER — DEXAMETHASONE SODIUM PHOSPHATE 10 MG/ML IJ SOLN
INTRAMUSCULAR | Status: DC | PRN
  Administered 2017-03-29: 4 mg via INTRAVENOUS

## 2017-03-29 MED ORDER — PHENYLEPHRINE 40 MCG/ML (10ML) SYRINGE FOR IV PUSH (FOR BLOOD PRESSURE SUPPORT)
PREFILLED_SYRINGE | INTRAVENOUS | Status: AC
  Filled 2017-03-29: qty 10

## 2017-03-29 MED ORDER — PHENYLEPHRINE HCL 10 MG/ML IJ SOLN
INTRAMUSCULAR | Status: DC | PRN
  Administered 2017-03-29 (×2): 40 ug via INTRAVENOUS

## 2017-03-29 MED ORDER — SODIUM CHLORIDE 0.9 % IV SOLN
INTRAVENOUS | Status: DC | PRN
  Administered 2017-03-29: 500 mL

## 2017-03-29 MED ORDER — LIDOCAINE HCL (PF) 1 % IJ SOLN
INTRAMUSCULAR | Status: AC
  Filled 2017-03-29: qty 30

## 2017-03-29 SURGICAL SUPPLY — 67 items
ARMBAND PINK RESTRICT EXTREMIT (MISCELLANEOUS) ×3 IMPLANT
BALLN LUTONIX AV 12X40X75 (BALLOONS) ×6
BALLN MUSTANG 10X60X135 (BALLOONS) ×3
BALLN MUSTANG 8X60X135 (BALLOONS) ×3
BALLOON LUTONIX AV 12X40X75 (BALLOONS) ×2 IMPLANT
BALLOON MUSTANG 10X60X135 (BALLOONS) ×1 IMPLANT
BALLOON MUSTANG 8X60X135 (BALLOONS) ×1 IMPLANT
CANISTER SUCT 3000ML PPV (MISCELLANEOUS) ×3 IMPLANT
CATH ANGIO 5F BER2 65CM (CATHETERS) ×3 IMPLANT
CLIP VESOCCLUDE MED 6/CT (CLIP) ×3 IMPLANT
CLIP VESOCCLUDE SM WIDE 6/CT (CLIP) ×3 IMPLANT
COVER PROBE W GEL 5X96 (DRAPES) ×3 IMPLANT
DERMABOND ADVANCED (GAUZE/BANDAGES/DRESSINGS) ×2
DERMABOND ADVANCED .7 DNX12 (GAUZE/BANDAGES/DRESSINGS) ×1 IMPLANT
DEVICE TORQUE KENDALL .025-038 (MISCELLANEOUS) ×3 IMPLANT
DRAPE BRACHIAL (DRAPES) ×3 IMPLANT
DRAPE FEMORAL ANGIO 80X135IN (DRAPES) ×12 IMPLANT
DRESSING OPSITE X SMALL 2X3 (GAUZE/BANDAGES/DRESSINGS) ×3 IMPLANT
ELECT REM PT RETURN 9FT ADLT (ELECTROSURGICAL) ×3
ELECTRODE REM PT RTRN 9FT ADLT (ELECTROSURGICAL) ×1 IMPLANT
GAUZE SPONGE 2X2 8PLY STRL LF (GAUZE/BANDAGES/DRESSINGS) ×1 IMPLANT
GAUZE SPONGE 4X4 16PLY XRAY LF (GAUZE/BANDAGES/DRESSINGS) ×3 IMPLANT
GLOVE BIO SURGEON STRL SZ7.5 (GLOVE) IMPLANT
GLOVE BIOGEL PI IND STRL 6.5 (GLOVE) IMPLANT
GLOVE BIOGEL PI IND STRL 7.0 (GLOVE) IMPLANT
GLOVE BIOGEL PI INDICATOR 6.5 (GLOVE)
GLOVE BIOGEL PI INDICATOR 7.0 (GLOVE)
GLOVE SKINSENSE NS SZ6.5 (GLOVE) ×8
GLOVE SKINSENSE NS SZ7.0 (GLOVE) ×6
GLOVE SKINSENSE NS SZ7.5 (GLOVE) ×4
GLOVE SKINSENSE STRL SZ6.5 (GLOVE) ×4 IMPLANT
GLOVE SKINSENSE STRL SZ7.0 (GLOVE) ×3 IMPLANT
GLOVE SKINSENSE STRL SZ7.5 (GLOVE) ×2 IMPLANT
GOWN STRL REUS W/ TWL LRG LVL3 (GOWN DISPOSABLE) ×2 IMPLANT
GOWN STRL REUS W/ TWL XL LVL3 (GOWN DISPOSABLE) ×1 IMPLANT
GOWN STRL REUS W/TWL LRG LVL3 (GOWN DISPOSABLE) ×4
GOWN STRL REUS W/TWL XL LVL3 (GOWN DISPOSABLE) ×2
GUIDEWIRE ANGLED .035X260CM (WIRE) ×3 IMPLANT
KIT BASIN OR (CUSTOM PROCEDURE TRAY) ×3 IMPLANT
KIT ENCORE 26 ADVANTAGE (KITS) ×3 IMPLANT
KIT ROOM TURNOVER OR (KITS) ×3 IMPLANT
MUSTANG BALLOON CATHETER ×3 IMPLANT
NEEDLE PERC 18GX7CM (NEEDLE) ×3 IMPLANT
NS IRRIG 1000ML POUR BTL (IV SOLUTION) ×3 IMPLANT
PACK CV ACCESS (CUSTOM PROCEDURE TRAY) ×3 IMPLANT
PAD ARMBOARD 7.5X6 YLW CONV (MISCELLANEOUS) ×6 IMPLANT
PROTECTION STATION PRESSURIZED (MISCELLANEOUS) ×3
SET MICROPUNCTURE 5F STIFF (MISCELLANEOUS) ×3 IMPLANT
SHEATH PINNACLE 5F 10CM (SHEATH) ×3 IMPLANT
SHEATH PINNACLE 9F 10CM (SHEATH) ×3 IMPLANT
SHEATH PINNACLE R/O II 7F 4CM (SHEATH) ×3 IMPLANT
SPONGE GAUZE 2X2 STER 10/PKG (GAUZE/BANDAGES/DRESSINGS) ×2
STATION PROTECTION PRESSURIZED (MISCELLANEOUS) ×1 IMPLANT
STOPCOCK 4 WAY LG BORE MALE ST (IV SETS) ×3 IMPLANT
SUT MNCRL AB 4-0 PS2 18 (SUTURE) ×3 IMPLANT
SUT PROLENE 6 0 BV (SUTURE) IMPLANT
SUT VIC AB 3-0 SH 27 (SUTURE)
SUT VIC AB 3-0 SH 27X BRD (SUTURE) IMPLANT
SYR 10ML LL (SYRINGE) ×12 IMPLANT
SYRINGE 12CC LL (MISCELLANEOUS) ×3 IMPLANT
SYRINGE 20CC LL (MISCELLANEOUS) ×6 IMPLANT
TOWEL GREEN STERILE (TOWEL DISPOSABLE) ×3 IMPLANT
TUBING HIGH PRESS EXTEN 6IN (TUBING) ×3 IMPLANT
UNDERPAD 30X30 (UNDERPADS AND DIAPERS) ×3 IMPLANT
WATER STERILE IRR 1000ML POUR (IV SOLUTION) ×3 IMPLANT
WIRE AMPLATZ SS-J .035X260CM (WIRE) ×3 IMPLANT
WIRE BENTSON .035X145CM (WIRE) ×3 IMPLANT

## 2017-03-29 NOTE — H&P (Signed)
   History and Physical Update  The patient was interviewed and re-examined.  The patient's previous History and Physical has been reviewed and is unchanged from Dr. Oneida Alar office visit. There is a thrill in the forearm but the upper arm may be thrombosed rendering the avf non-salvageable. Will attempt fistulogram from both right arm and right femoral approach as we did last time but she may ultimately end up with a catheter and she is aware.   Brandon C. Donzetta Matters, MD [facility] [place] Office: [phone] Pager: [phone]   03/29/2017, 10:29 AM

## 2017-03-29 NOTE — Op Note (Signed)
    Patient name: [name] MRN: [id number] DOB: 01-12-1951 Sex: female  03/29/2017 Pre-operative Diagnosis: esrd, hiv, central venous stenosis  Post-operative diagnosis:  Same Surgeon:  Eda Paschal. Donzetta Matters, MD Procedure Performed: 1.  US guided cannulation of right upper arm av fistula 2.  Drug coated balloon angioplasty of right innominate and subclavian veins with 4m lutonix  Indications: 67year old female with end-stage renal disease has a history of a right upper extremity AV fistula that required complex balloon angioplasty last February.  She denies swelling of her right arm for 3 weeks with apparent collaterals on her chest and arm although the fistula is still working.  She is now indicated for repeat fistulogram possible intervention.  Findings: She had a 95% stenosis at the right innominate vein and also stenosis back into the subclavian vein with significant reflux of contrast into the right IJ.  At completion we had a 12 mm drug-coated balloon angioplasty there was still some residual stenosis at the level of the first rib and some residual reflux of contrast into the right IJ but contrast flowed much much more briskly into the right atrium.  There is also a strong thrill in the right forearm at completion.   Procedure:  The patient was identified in the holding area and taken to the operating room where she was placed supine on the operative table and MAC anesthesia was induced.  She was sterilely prepped and draped in the right upper extremity and right groin for possible femoral access given antibiotics and timeout called.  We used ultrasound guidance to cannulate the right upper arm AV fistula with micropuncture needle followed by a sheath and we performed fistulogram with the above findings.  We were able to cross antegrade after placing a 7 French sheath using a Glidewire and bare catheter and the patient was given 3000 units of heparin.  We then serially dilated the  above-noted stenoses with 8, 10 and 12 mm balloons.  We then exchanged for a Amplatz wire and a 10 French sheath and placed a 12 mm drug-coated balloon and performed angioplasty for 2-1/2 minutes at both sites.  At completion there were the above findings with flow into the right atrium although there was still reflux into the right IJ.  I elected not to stent given the locations of these disease.  With this the wire was removed and the cannulation site closed with 4-0 Monocryl.  Patient did tolerate procedure well without immediate complication.  EBL 20 cc.   Lenda Baratta C. CDonzetta Matters MD [facility] [place]: [phone]: [phone]

## 2017-03-29 NOTE — Progress Notes (Signed)
Report called to Nurse Lattie Haw at [facility]. Holly at the facility states a transportation service is on their way to pick the patient up to transport back to [employer] [employer].

## 2017-03-29 NOTE — Anesthesia Postprocedure Evaluation (Signed)
Anesthesia Post Note  Patient: [name]  Procedure(s) Performed: FISTULOGRAM COMPLEX RIGHT ARM with Balloon angioplasty (Right Arm Upper)     Patient location during evaluation: PACU Anesthesia Type: MAC Level of consciousness: awake and alert Pain management: pain level controlled Vital Signs Assessment: post-procedure vital signs reviewed and stable Respiratory status: spontaneous breathing, nonlabored ventilation and respiratory function stable Cardiovascular status: stable and blood pressure returned to baseline Postop Assessment: no apparent nausea or vomiting Anesthetic complications: no    Last Vitals:  Vitals:   03/29/17 1355 03/29/17 1413  BP: 129/78 133/82  Pulse: 61 62  Resp: 16 18  Temp: (!) 36.4 C   SpO2: 96% 98%    Last Pain:  Vitals:   03/29/17 1352  TempSrc:   PainSc: 4                  Catalina Gravel

## 2017-03-29 NOTE — Transfer of Care (Signed)
Immediate Anesthesia Transfer of Care Note  Patient: [name]  Procedure(s) Performed: FISTULOGRAM COMPLEX RIGHT ARM with Balloon angioplasty (Right Arm Upper)  Patient Location: PACU  Anesthesia Type:MAC  Level of Consciousness: awake, alert  and oriented  Airway & Oxygen Therapy: Patient Spontanous Breathing  Post-op Assessment: Report given to RN, Post -op Vital signs reviewed and stable and Patient moving all extremities X 4  Post vital signs: Reviewed and stable  Last Vitals:  Vitals:   03/29/17 0755 03/29/17 1253  BP: (!) 150/78 121/76  Pulse: 70 (!) 59  Resp: 18 12  Temp: 36.9 C (!) 36.2 C  SpO2: 100% 99%    Last Pain:  Vitals:   03/29/17 0843  TempSrc:   PainSc: 0-No pain      Patients Stated Pain Goal: 0 (24/82/50 0370)  Complications: No apparent anesthesia complications

## 2017-03-29 NOTE — Anesthesia Preprocedure Evaluation (Addendum)
Anesthesia Evaluation  Patient identified by MRN, date of birth, ID band Patient awake    Reviewed: Allergy & Precautions, NPO status , Patient's Chart, lab work & pertinent test results, reviewed documented beta blocker date and time   Airway Mallampati: II  TM Distance: >3 FB Neck ROM: Full    Dental  (+) Dental Advisory Given, Edentulous Lower, Edentulous Upper   Pulmonary former smoker,    Pulmonary exam normal breath sounds clear to auscultation       Cardiovascular hypertension, Pt. on home beta blockers +CHF  Normal cardiovascular exam+ dysrhythmias Atrial Fibrillation  Rhythm:Regular Rate:Normal  Echo 02/21/15: Study Conclusions  - Left ventricle: The cavity size was normal. Wall thickness was normal. Systolic function was normal. The estimated ejection fraction was in the range of 60% to 65%. - Mitral valve: There was mild regurgitation. - Right atrium: The atrium was mildly dilated.   Neuro/Psych  Headaches, PSYCHIATRIC DISORDERS Anxiety Depression    GI/Hepatic Neg liver ROS, GERD  Medicated,  Endo/Other  negative endocrine ROS  Renal/GU ESRF and DialysisRenal disease (K+ 4.8, last dialysis Friday)     Musculoskeletal negative musculoskeletal ROS (+)   Abdominal   Peds  Hematology  (+) HIV,   Anesthesia Other Findings Day of surgery medications reviewed with the patient.  Reproductive/Obstetrics                            Anesthesia Physical Anesthesia Plan  ASA: IV  Anesthesia Plan: MAC   Post-op Pain Management:    Induction: Intravenous  PONV Risk Score and Plan: 2 and Propofol infusion  Airway Management Planned: Nasal Cannula  Additional Equipment:   Intra-op Plan: Utilization Of Total Body Hypothermia per surgeon request  Post-operative Plan:   Informed Consent: I have reviewed the patients History and Physical, chart, labs and discussed the procedure  including the risks, benefits and alternatives for the proposed anesthesia with the patient or authorized representative who has indicated his/her understanding and acceptance.   Dental advisory given  Plan Discussed with: CRNA and Anesthesiologist  Anesthesia Plan Comments: (Discussed risks/benefits/alternatives to MAC sedation including need for ventilatory support, hypotension, need for conversion to general anesthesia.  All patient questions answered.  Patient/guardian wishes to proceed.)       Anesthesia Quick Evaluation

## 2017-03-30 DIAGNOSIS — B20 Human immunodeficiency virus [HIV] disease: Secondary | ICD-10-CM | POA: Diagnosis not present

## 2017-03-30 DIAGNOSIS — Z8673 Personal history of transient ischemic attack (TIA), and cerebral infarction without residual deficits: Secondary | ICD-10-CM | POA: Diagnosis not present

## 2017-03-30 DIAGNOSIS — N186 End stage renal disease: Secondary | ICD-10-CM | POA: Diagnosis not present

## 2017-03-30 DIAGNOSIS — T82898A Other specified complication of vascular prosthetic devices, implants and grafts, initial encounter: Secondary | ICD-10-CM | POA: Diagnosis not present

## 2017-03-31 DIAGNOSIS — N2581 Secondary hyperparathyroidism of renal origin: Secondary | ICD-10-CM | POA: Diagnosis not present

## 2017-03-31 DIAGNOSIS — D472 Monoclonal gammopathy: Secondary | ICD-10-CM | POA: Diagnosis not present

## 2017-03-31 DIAGNOSIS — D509 Iron deficiency anemia, unspecified: Secondary | ICD-10-CM | POA: Diagnosis not present

## 2017-03-31 DIAGNOSIS — Z992 Dependence on renal dialysis: Secondary | ICD-10-CM | POA: Diagnosis not present

## 2017-03-31 DIAGNOSIS — N186 End stage renal disease: Secondary | ICD-10-CM | POA: Diagnosis not present

## 2017-04-01 DIAGNOSIS — N186 End stage renal disease: Secondary | ICD-10-CM | POA: Diagnosis not present

## 2017-04-01 DIAGNOSIS — Z992 Dependence on renal dialysis: Secondary | ICD-10-CM | POA: Diagnosis not present

## 2017-04-02 DIAGNOSIS — R112 Nausea with vomiting, unspecified: Secondary | ICD-10-CM | POA: Diagnosis not present

## 2017-04-02 DIAGNOSIS — E611 Iron deficiency: Secondary | ICD-10-CM | POA: Diagnosis not present

## 2017-04-02 DIAGNOSIS — N186 End stage renal disease: Secondary | ICD-10-CM | POA: Diagnosis not present

## 2017-04-02 DIAGNOSIS — Z992 Dependence on renal dialysis: Secondary | ICD-10-CM | POA: Diagnosis not present

## 2017-04-02 DIAGNOSIS — D472 Monoclonal gammopathy: Secondary | ICD-10-CM | POA: Diagnosis not present

## 2017-04-02 DIAGNOSIS — D631 Anemia in chronic kidney disease: Secondary | ICD-10-CM | POA: Diagnosis not present

## 2017-04-02 DIAGNOSIS — N2581 Secondary hyperparathyroidism of renal origin: Secondary | ICD-10-CM | POA: Diagnosis not present

## 2017-04-05 ENCOUNTER — Encounter (HOSPITAL_COMMUNITY): Payer: Self-pay | Admitting: Vascular Surgery

## 2017-04-06 DIAGNOSIS — D631 Anemia in chronic kidney disease: Secondary | ICD-10-CM | POA: Diagnosis not present

## 2017-04-06 DIAGNOSIS — N2581 Secondary hyperparathyroidism of renal origin: Secondary | ICD-10-CM | POA: Diagnosis not present

## 2017-04-06 DIAGNOSIS — D472 Monoclonal gammopathy: Secondary | ICD-10-CM | POA: Diagnosis not present

## 2017-04-06 DIAGNOSIS — E611 Iron deficiency: Secondary | ICD-10-CM | POA: Diagnosis not present

## 2017-04-06 DIAGNOSIS — N186 End stage renal disease: Secondary | ICD-10-CM | POA: Diagnosis not present

## 2017-04-06 DIAGNOSIS — R112 Nausea with vomiting, unspecified: Secondary | ICD-10-CM | POA: Diagnosis not present

## 2017-04-07 DIAGNOSIS — D631 Anemia in chronic kidney disease: Secondary | ICD-10-CM | POA: Diagnosis not present

## 2017-04-07 DIAGNOSIS — R112 Nausea with vomiting, unspecified: Secondary | ICD-10-CM | POA: Diagnosis not present

## 2017-04-07 DIAGNOSIS — N186 End stage renal disease: Secondary | ICD-10-CM | POA: Diagnosis not present

## 2017-04-07 DIAGNOSIS — N2581 Secondary hyperparathyroidism of renal origin: Secondary | ICD-10-CM | POA: Diagnosis not present

## 2017-04-07 DIAGNOSIS — D472 Monoclonal gammopathy: Secondary | ICD-10-CM | POA: Diagnosis not present

## 2017-04-07 DIAGNOSIS — E611 Iron deficiency: Secondary | ICD-10-CM | POA: Diagnosis not present

## 2017-04-08 ENCOUNTER — Ambulatory Visit: Payer: Medicare Other | Admitting: Infectious Diseases

## 2017-04-09 DIAGNOSIS — D472 Monoclonal gammopathy: Secondary | ICD-10-CM | POA: Diagnosis not present

## 2017-04-09 DIAGNOSIS — E611 Iron deficiency: Secondary | ICD-10-CM | POA: Diagnosis not present

## 2017-04-09 DIAGNOSIS — D631 Anemia in chronic kidney disease: Secondary | ICD-10-CM | POA: Diagnosis not present

## 2017-04-09 DIAGNOSIS — R112 Nausea with vomiting, unspecified: Secondary | ICD-10-CM | POA: Diagnosis not present

## 2017-04-09 DIAGNOSIS — N186 End stage renal disease: Secondary | ICD-10-CM | POA: Diagnosis not present

## 2017-04-09 DIAGNOSIS — N2581 Secondary hyperparathyroidism of renal origin: Secondary | ICD-10-CM | POA: Diagnosis not present

## 2017-04-12 DIAGNOSIS — N2581 Secondary hyperparathyroidism of renal origin: Secondary | ICD-10-CM | POA: Diagnosis not present

## 2017-04-12 DIAGNOSIS — R112 Nausea with vomiting, unspecified: Secondary | ICD-10-CM | POA: Diagnosis not present

## 2017-04-12 DIAGNOSIS — E611 Iron deficiency: Secondary | ICD-10-CM | POA: Diagnosis not present

## 2017-04-12 DIAGNOSIS — D631 Anemia in chronic kidney disease: Secondary | ICD-10-CM | POA: Diagnosis not present

## 2017-04-12 DIAGNOSIS — D472 Monoclonal gammopathy: Secondary | ICD-10-CM | POA: Diagnosis not present

## 2017-04-12 DIAGNOSIS — N186 End stage renal disease: Secondary | ICD-10-CM | POA: Diagnosis not present

## 2017-04-14 DIAGNOSIS — E611 Iron deficiency: Secondary | ICD-10-CM | POA: Diagnosis not present

## 2017-04-14 DIAGNOSIS — D472 Monoclonal gammopathy: Secondary | ICD-10-CM | POA: Diagnosis not present

## 2017-04-14 DIAGNOSIS — R112 Nausea with vomiting, unspecified: Secondary | ICD-10-CM | POA: Diagnosis not present

## 2017-04-14 DIAGNOSIS — D631 Anemia in chronic kidney disease: Secondary | ICD-10-CM | POA: Diagnosis not present

## 2017-04-14 DIAGNOSIS — N186 End stage renal disease: Secondary | ICD-10-CM | POA: Diagnosis not present

## 2017-04-14 DIAGNOSIS — N2581 Secondary hyperparathyroidism of renal origin: Secondary | ICD-10-CM | POA: Diagnosis not present

## 2017-04-16 DIAGNOSIS — N186 End stage renal disease: Secondary | ICD-10-CM | POA: Diagnosis not present

## 2017-04-16 DIAGNOSIS — D472 Monoclonal gammopathy: Secondary | ICD-10-CM | POA: Diagnosis not present

## 2017-04-16 DIAGNOSIS — N2581 Secondary hyperparathyroidism of renal origin: Secondary | ICD-10-CM | POA: Diagnosis not present

## 2017-04-16 DIAGNOSIS — R112 Nausea with vomiting, unspecified: Secondary | ICD-10-CM | POA: Diagnosis not present

## 2017-04-16 DIAGNOSIS — D631 Anemia in chronic kidney disease: Secondary | ICD-10-CM | POA: Diagnosis not present

## 2017-04-16 DIAGNOSIS — E611 Iron deficiency: Secondary | ICD-10-CM | POA: Diagnosis not present

## 2017-04-19 DIAGNOSIS — N186 End stage renal disease: Secondary | ICD-10-CM | POA: Diagnosis not present

## 2017-04-19 DIAGNOSIS — D631 Anemia in chronic kidney disease: Secondary | ICD-10-CM | POA: Diagnosis not present

## 2017-04-19 DIAGNOSIS — E611 Iron deficiency: Secondary | ICD-10-CM | POA: Diagnosis not present

## 2017-04-19 DIAGNOSIS — D472 Monoclonal gammopathy: Secondary | ICD-10-CM | POA: Diagnosis not present

## 2017-04-19 DIAGNOSIS — R112 Nausea with vomiting, unspecified: Secondary | ICD-10-CM | POA: Diagnosis not present

## 2017-04-19 DIAGNOSIS — N2581 Secondary hyperparathyroidism of renal origin: Secondary | ICD-10-CM | POA: Diagnosis not present

## 2017-04-20 DIAGNOSIS — B20 Human immunodeficiency virus [HIV] disease: Secondary | ICD-10-CM | POA: Diagnosis not present

## 2017-04-20 DIAGNOSIS — N186 End stage renal disease: Secondary | ICD-10-CM | POA: Diagnosis not present

## 2017-04-20 DIAGNOSIS — T82898A Other specified complication of vascular prosthetic devices, implants and grafts, initial encounter: Secondary | ICD-10-CM | POA: Diagnosis not present

## 2017-04-20 DIAGNOSIS — Z8673 Personal history of transient ischemic attack (TIA), and cerebral infarction without residual deficits: Secondary | ICD-10-CM | POA: Diagnosis not present

## 2017-04-21 DIAGNOSIS — R112 Nausea with vomiting, unspecified: Secondary | ICD-10-CM | POA: Diagnosis not present

## 2017-04-21 DIAGNOSIS — N186 End stage renal disease: Secondary | ICD-10-CM | POA: Diagnosis not present

## 2017-04-21 DIAGNOSIS — N2581 Secondary hyperparathyroidism of renal origin: Secondary | ICD-10-CM | POA: Diagnosis not present

## 2017-04-21 DIAGNOSIS — E611 Iron deficiency: Secondary | ICD-10-CM | POA: Diagnosis not present

## 2017-04-21 DIAGNOSIS — D472 Monoclonal gammopathy: Secondary | ICD-10-CM | POA: Diagnosis not present

## 2017-04-21 DIAGNOSIS — D631 Anemia in chronic kidney disease: Secondary | ICD-10-CM | POA: Diagnosis not present

## 2017-04-22 ENCOUNTER — Telehealth: Payer: Self-pay

## 2017-04-22 ENCOUNTER — Encounter: Payer: Self-pay | Admitting: Infectious Diseases

## 2017-04-22 ENCOUNTER — Encounter: Payer: Self-pay | Admitting: Gastroenterology

## 2017-04-22 ENCOUNTER — Ambulatory Visit (INDEPENDENT_AMBULATORY_CARE_PROVIDER_SITE_OTHER): Payer: Medicare Other | Admitting: Infectious Diseases

## 2017-04-22 VITALS — BP 128/84 | HR 66 | Temp 98.1°F

## 2017-04-22 DIAGNOSIS — Z79899 Other long term (current) drug therapy: Secondary | ICD-10-CM | POA: Diagnosis not present

## 2017-04-22 DIAGNOSIS — G43A0 Cyclical vomiting, not intractable: Secondary | ICD-10-CM | POA: Diagnosis not present

## 2017-04-22 DIAGNOSIS — I871 Compression of vein: Secondary | ICD-10-CM

## 2017-04-22 DIAGNOSIS — B20 Human immunodeficiency virus [HIV] disease: Secondary | ICD-10-CM

## 2017-04-22 DIAGNOSIS — I509 Heart failure, unspecified: Secondary | ICD-10-CM | POA: Diagnosis not present

## 2017-04-22 DIAGNOSIS — R1115 Cyclical vomiting syndrome unrelated to migraine: Secondary | ICD-10-CM

## 2017-04-22 DIAGNOSIS — N186 End stage renal disease: Secondary | ICD-10-CM

## 2017-04-22 DIAGNOSIS — Z992 Dependence on renal dialysis: Secondary | ICD-10-CM

## 2017-04-22 DIAGNOSIS — Z113 Encounter for screening for infections with a predominantly sexual mode of transmission: Secondary | ICD-10-CM | POA: Diagnosis not present

## 2017-04-22 NOTE — Assessment & Plan Note (Signed)
She has recurrence of her sx.  Will have her seen by GI as I am unsure how to proceed aside from avoiding reglan.

## 2017-04-22 NOTE — Assessment & Plan Note (Signed)
She appears to be at baseline- no edema. Wt stable, normal exam, no sob.

## 2017-04-22 NOTE — Progress Notes (Signed)
   Subjective:    Patient ID: [name], female    DOB: 1950-11-08, 67 y.o.   MRN: [id number]  HPI 67 yo F with HIV+, ESRD, diastolic CHF, parox aflutter, prev normal cath at [facility] (2016).staying at SNF [facility]). She is on tivicay/zidovudine/epivir dosed for her HD.  She was in hospital 2-15 to 04-19-16 foroccluded right subclavian vein, with stenosis of right innominate vein. Patient seen by vascular surgery, pt had ballonangioplasty of right subclavian vein and innominate vein.  Her RUE AVF was again repaired 03-29-17. 103 # (per pt 48kg dry wt).   Has been having emesis, nearly everyday according to her SNF accompaniment. Has emesis after eating. Everything that she eats comes up.  Denies difficulty taking her meds.   HIV 1 RNA Quant (copies/mL)  Date Value  07/23/2015 51 (H)  02/12/2015 <20  03/26/2014 <20   CD4 T Cell Abs (/uL)  Date Value  07/23/2015 250 (L)  02/12/2015 280 (L)  03/26/2014 140 (L)    Review of Systems  Constitutional: Positive for appetite change. Negative for chills, fever and unexpected weight change.  Respiratory: Negative for cough and shortness of breath.   Gastrointestinal: Negative for constipation and diarrhea.  Genitourinary: Negative for difficulty urinating (makes no urine. ).  Neurological: Positive for headaches.  headaches are rare.  No sick exposure where she is staying.  Please see HPI. All other systems reviewed and negative.     Objective:   Physical Exam  Constitutional: She appears well-developed. She appears cachectic.  Non-toxic appearance. She does not have a sickly appearance. No distress.  HENT:  Mouth/Throat: No oropharyngeal exudate.  Eyes: EOM are normal. Pupils are equal, round, and reactive to light.  Neck: Neck supple.  Cardiovascular: Normal rate, regular rhythm and normal heart sounds.  Pulmonary/Chest: Effort normal and breath sounds normal.  Abdominal: Soft. Bowel sounds are normal. There is no  tenderness. There is no rebound.  Musculoskeletal: She exhibits no edema.       Arms: Lymphadenopathy:    She has no cervical adenopathy.  Psychiatric: She has a normal mood and affect.      Assessment & Plan:

## 2017-04-22 NOTE — Assessment & Plan Note (Signed)
She appears to be doing well.  She has an audible bruit throughout her AVF.  Appreciate Vasc f/u.

## 2017-04-22 NOTE — Assessment & Plan Note (Signed)
She will continue to f/u with her renal MD.  She appears to be fluid balanced.

## 2017-04-22 NOTE — Telephone Encounter (Signed)
Error

## 2017-04-22 NOTE — Assessment & Plan Note (Signed)
She appears to be doing well, although we don't have labs since 2015.  Will check her labs today.  She has gotten flu shot via hx given by her health aid.  Will see her back in 6 months.

## 2017-04-23 DIAGNOSIS — F331 Major depressive disorder, recurrent, moderate: Secondary | ICD-10-CM | POA: Diagnosis not present

## 2017-04-23 DIAGNOSIS — F5101 Primary insomnia: Secondary | ICD-10-CM | POA: Diagnosis not present

## 2017-04-23 DIAGNOSIS — F411 Generalized anxiety disorder: Secondary | ICD-10-CM | POA: Diagnosis not present

## 2017-04-23 LAB — CBC
HCT: 32.3 % — ABNORMAL LOW (ref 35.0–45.0)
Hemoglobin: 10.6 g/dL — ABNORMAL LOW (ref 11.7–15.5)
MCH: 34.5 pg — ABNORMAL HIGH (ref 27.0–33.0)
MCHC: 32.8 g/dL (ref 32.0–36.0)
MCV: 105.2 fL — ABNORMAL HIGH (ref 80.0–100.0)
MPV: 10.4 fL (ref 7.5–12.5)
Platelets: 160 10*3/uL (ref 140–400)
RBC: 3.07 10*6/uL — ABNORMAL LOW (ref 3.80–5.10)
RDW: 13 % (ref 11.0–15.0)
WBC: 3.2 10*3/uL — AB (ref 3.8–10.8)

## 2017-04-23 LAB — COMPREHENSIVE METABOLIC PANEL
AG Ratio: 1 (calc) (ref 1.0–2.5)
ALT: 9 U/L (ref 6–29)
AST: 17 U/L (ref 10–35)
Albumin: 4 g/dL (ref 3.6–5.1)
Alkaline phosphatase (APISO): 76 U/L (ref 33–130)
BILIRUBIN TOTAL: 0.5 mg/dL (ref 0.2–1.2)
BUN / CREAT RATIO: 4 (calc) — AB (ref 6–22)
BUN: 21 mg/dL (ref 7–25)
CALCIUM: 10.3 mg/dL (ref 8.6–10.4)
CO2: 32 mmol/L (ref 20–32)
Chloride: 96 mmol/L — ABNORMAL LOW (ref 98–110)
Creat: 5.89 mg/dL — ABNORMAL HIGH (ref 0.50–0.99)
GLUCOSE: 94 mg/dL (ref 65–99)
Globulin: 4.2 g/dL (calc) — ABNORMAL HIGH (ref 1.9–3.7)
Potassium: 3.6 mmol/L (ref 3.5–5.3)
SODIUM: 139 mmol/L (ref 135–146)
TOTAL PROTEIN: 8.2 g/dL — AB (ref 6.1–8.1)

## 2017-04-23 LAB — LIPID PANEL
Cholesterol: 155 mg/dL (ref <200)
HDL: 54 mg/dL (ref >50)
LDL CHOLESTEROL (CALC): 81 mg/dL
Non-HDL Cholesterol (Calc): 101 mg/dL (calc) (ref <130)
TRIGLYCERIDES: 108 mg/dL (ref <150)
Total CHOL/HDL Ratio: 2.9 (calc) (ref <5.0)

## 2017-04-23 LAB — RPR: RPR: NONREACTIVE

## 2017-04-23 LAB — T-HELPER CELL (CD4) - (RCID CLINIC ONLY)
CD4 % Helper T Cell: 28 % — ABNORMAL LOW (ref 33–55)
CD4 T Cell Abs: 450 /uL (ref 400–2700)

## 2017-04-24 LAB — HIV-1 RNA QUANT-NO REFLEX-BLD
HIV 1 RNA QUANT: NOT DETECTED {copies}/mL
HIV-1 RNA QUANT, LOG: NOT DETECTED {Log_copies}/mL

## 2017-04-26 DIAGNOSIS — D631 Anemia in chronic kidney disease: Secondary | ICD-10-CM | POA: Diagnosis not present

## 2017-04-26 DIAGNOSIS — N2581 Secondary hyperparathyroidism of renal origin: Secondary | ICD-10-CM | POA: Diagnosis not present

## 2017-04-26 DIAGNOSIS — E611 Iron deficiency: Secondary | ICD-10-CM | POA: Diagnosis not present

## 2017-04-26 DIAGNOSIS — N186 End stage renal disease: Secondary | ICD-10-CM | POA: Diagnosis not present

## 2017-04-26 DIAGNOSIS — D472 Monoclonal gammopathy: Secondary | ICD-10-CM | POA: Diagnosis not present

## 2017-04-26 DIAGNOSIS — R112 Nausea with vomiting, unspecified: Secondary | ICD-10-CM | POA: Diagnosis not present

## 2017-04-27 ENCOUNTER — Encounter: Payer: Self-pay | Admitting: Gastroenterology

## 2017-04-27 ENCOUNTER — Ambulatory Visit (INDEPENDENT_AMBULATORY_CARE_PROVIDER_SITE_OTHER): Payer: Medicare Other | Admitting: Gastroenterology

## 2017-04-27 VITALS — BP 120/70 | HR 64 | Ht 59.0 in | Wt 104.1 lb

## 2017-04-27 DIAGNOSIS — G43A0 Cyclical vomiting, not intractable: Secondary | ICD-10-CM

## 2017-04-27 DIAGNOSIS — R1115 Cyclical vomiting syndrome unrelated to migraine: Secondary | ICD-10-CM

## 2017-04-27 DIAGNOSIS — N186 End stage renal disease: Secondary | ICD-10-CM | POA: Diagnosis not present

## 2017-04-27 DIAGNOSIS — F411 Generalized anxiety disorder: Secondary | ICD-10-CM | POA: Diagnosis not present

## 2017-04-27 DIAGNOSIS — B20 Human immunodeficiency virus [HIV] disease: Secondary | ICD-10-CM

## 2017-04-27 DIAGNOSIS — Z992 Dependence on renal dialysis: Secondary | ICD-10-CM | POA: Diagnosis not present

## 2017-04-27 DIAGNOSIS — I480 Paroxysmal atrial fibrillation: Secondary | ICD-10-CM

## 2017-04-27 DIAGNOSIS — F331 Major depressive disorder, recurrent, moderate: Secondary | ICD-10-CM | POA: Diagnosis not present

## 2017-04-27 NOTE — Patient Instructions (Signed)
If you are age 65 or older, your body mass index should be between 23-30. Your Body mass index is 21.03 kg/m. If this is out of the aforementioned range listed, please consider follow up with your Primary Care Provider.  If you are age 5 or younger, your body mass index should be between 19-25. Your Body mass index is 21.03 kg/m. If this is out of the aformentioned range listed, please consider follow up with your Primary Care Provider.   Follow up as needed.   Please schedule the patient for a upper Gi series to rule out obstruction.   Thank you for choosing [facility]  Dr Wilfrid Lund III

## 2017-04-27 NOTE — Progress Notes (Signed)
[facility] Consult Note:  History: [name] 04/27/2017  Referring physician: Hilbert Corrigan, MD  [name], [name], [name] [id number] Fax number [phone]  Reason for consult/chief complaint: nausea and vomiting (intermittent x 6 months)   Subjective  HPI:  This is a 67 year old woman referred by medical director at her extended care facility noted above her chronic nausea and vomiting. I am afraid she is a very poor historian, and the caregiver with her today is not familiar with her condition. [name] describes perhaps several months of frequent nausea with intermittent vomiting. It does not seem to have any clear or consistent triggers, though she describes it bothering her when she is driving in a car and sometimes during or after dialysis. It is unknown if she has ever had any previous GI evaluation for this. She denies abdominal pain, hematemesis or rectal bleeding. Beyond this, no helpful history or review of systems can be obtained. I reviewed her last infectious disease note by Dr. Johnnye Sima describing her history of HIV, paroxysmal atrial fibrillation, end-stage renal disease on dialysis, and she also appears to have a chronic pain syndrome on chronic opioids. Upper endoscopy by Dr. Gala Romney [facility] in October 2012 done for anemia shows only nodular antral mucosa.   ROS:  Review of Systems  Constitutional: Positive for fatigue. Negative for appetite change and unexpected weight change.  HENT: Negative for mouth sores and voice change.   Eyes: Negative for pain and redness.  Respiratory: Negative for cough and shortness of breath.   Cardiovascular: Negative for chest pain and palpitations.  Genitourinary: Negative for dysuria and hematuria.  Musculoskeletal: Negative for arthralgias and myalgias.  Skin: Negative for pallor and rash.  Neurological: Negative for weakness and headaches.   Hematological: Negative for adenopathy.     Past Medical History: Past Medical History:  Diagnosis Date  . Anemia   . Anxiety   . Atrial flutter ([place]) 02/22/2015  . C. difficile colitis 10/10/11  . Chronic diarrhea   . Depression   . ESRD on hemodialysis ([place])   . GERD (gastroesophageal reflux disease)   . HIV (human immunodeficiency virus infection) ([place])   . Hypertension   . Insomnia   . Intracranial hemorrhage ([place])   . Muscle weakness (generalized)   . Pulmonary HTN ([place])   . Tachycardia   . TIA (transient ischemic attack)   . Traumatic hematoma of right upper arm 02/22/2015     Past Surgical History: Past Surgical History:  Procedure Laterality Date  . A/V FISTULAGRAM N/A 04/17/2016   Procedure: A/V Fistulagram - Right Upper;  Surgeon: Waynetta Sandy, MD;  Location: [place] [facility];  Service: Cardiovascular;  Laterality: N/A;  . BIOPSY THYROID    . Dialysis Shunts     previous one removed from left arm and now present one in right arm  . DIALYSIS/PERMA CATHETER INSERTION Right 04/17/2016   Procedure: dialysis Catheter Insertion central veinous;  Surgeon: Waynetta Sandy, MD;  Location: [name];  Service: Cardiovascular;  Laterality: Right;  . ESOPHAGOGASTRODUODENOSCOPY  12/15/2010   Procedure: ESOPHAGOGASTRODUODENOSCOPY (EGD);  Surgeon: Daneil Dolin, MD;  [facility];  Service: Endoscopy;  Laterality: N/A;  . EXCISION OF BREAST BIOPSY Right 05/04/2012   Procedure: EXCISION OF BREAST BIOPSY;  Surgeon: Donato Heinz, MD;  Location: [facility];  Service: General;  Laterality: Right;  Right Excisional Breast Biopsy  . FISTULOGRAM Right 03/26/2014  Procedure: Right Arm Fistulogram with Venoplasty Right Subclavian Vein and Inominate Vein. Debridement Fistula Ulcer;  Surgeon: Conrad Dona Ana, MD;  Location: [place];  Service: Vascular;  Laterality: Right;  . FISTULOGRAM Right 03/29/2017   Procedure: FISTULOGRAM COMPLEX RIGHT ARM with  Balloon angioplasty;  Surgeon: Waynetta Sandy, MD;  Location: [place];  Service: Vascular;  Laterality: Right;  . IR GENERIC HISTORICAL  05/19/2016   IR REMOVAL TUN CV CATH W/O FL 05/19/2016 Markus Daft, MD [facility]  . PERIPHERAL VASCULAR BALLOON ANGIOPLASTY Right 04/17/2016   Procedure: Peripheral Vascular Balloon Angioplasty;  Surgeon: Waynetta Sandy, MD;  Location: [place] [facility];  Service: Cardiovascular;  Laterality: Right;  Central segment AV Fistula  . SHUNTOGRAM Right 10/19/2013   Procedure: FISTULOGRAM;  Surgeon: Conrad , MD;  Location: [facility] C[facility] LAB;  Service: Cardiovascular;  Laterality: Right;  . TONSILLECTOMY       Family History: Family History  Problem Relation Age of Onset  . Anesthesia problems Neg Hx   . Hypotension Neg Hx   . Malignant hyperthermia Neg Hx   . Pseudochol deficiency Neg Hx     Social History: Social History   Socioeconomic History  . Marital status: Widowed    Spouse name: None  . Number of children: 1  . Years of education: None  . Highest education level: None  Social Needs  . Financial resource strain: None  . Food insecurity - worry: None  . Food insecurity - inability: None  . Transportation needs - medical: None  . Transportation needs - non-medical: None  Occupational History  . None  Tobacco Use  . Smoking status: Former Smoker    Last attempt to quit: 03/12/1983    Years since quitting: 34.1  . Smokeless tobacco: Never Used  Substance and Sexual Activity  . Alcohol use: No    Alcohol/week: 0.0 oz  . Drug use: No  . Sexual activity: Not Currently  Other Topics Concern  . None  Social History Narrative  . None    Allergies: Allergies  Allergen Reactions  . Lactose Intolerance (Gi) Other (See Comments)    On MAR  . Latex Rash and Itching  . Penicillins Rash and Swelling    Has patient had a PCN reaction causing immediate rash, facial/tongue/throat swelling, SOB or lightheadedness with  hypotension: No Has patient had a PCN reaction causing severe rash involving mucus membranes or skin necrosis: No Has patient had a PCN reaction that required hospitalization No Has patient had a PCN reaction occurring within the last 10 years: No If all of the above answers are "NO", then may proceed with Cephalosporin use.      Outpatient Meds: Current Outpatient Medications  Medication Sig Dispense Refill  . albuterol (PROVENTIL HFA;VENTOLIN HFA) 108 (90 Base) MCG/ACT inhaler Inhale 2 puffs into the lungs every 4 (four) hours as needed for wheezing or shortness of breath.    . bisacodyl (DULCOLAX) 5 MG EC tablet Take 5 mg by mouth daily as needed for moderate constipation.    Marland Kitchen buPROPion (WELLBUTRIN SR) 150 MG 12 hr tablet Take 150 mg by mouth every morning.    . cinacalcet (SENSIPAR) 30 MG tablet Take 30 mg by mouth daily.    . clopidogrel (PLAVIX) 75 MG tablet Take 1 tablet (75 mg total) by mouth daily. 30 tablet 3  . cyclobenzaprine (FLEXERIL) 5 MG tablet Take 5-10 mg by mouth every 12 (twelve) hours as needed for muscle spasms.     Marland Kitchen  diltiazem (CARDIZEM) 60 MG tablet Take 60 mg by mouth 2 (two) times daily.    . dolutegravir (TIVICAY) 50 MG tablet Take 50 mg by mouth daily.    . fentaNYL (DURAGESIC - DOSED MCG/HR) 50 MCG/HR Apply one patch every 72 hours for pain. Remove old patch. External use only. Rotate sites. 10 patch 0  . lamivudine (EPIVIR) 100 MG tablet Take 50 mg by mouth daily.    Marland Kitchen lidocaine-prilocaine (EMLA) cream Apply 1 application topically 3 (three) times a week. Apply 1 hour prior to dialysis access site on Monday Wednesday and Friday    . Melatonin 3 MG TABS Take 3 mg by mouth at bedtime.    . metoprolol (LOPRESSOR) 50 MG tablet Take 50 mg by mouth 2 (two) times daily.    . multivitamin (RENA-VIT) TABS tablet Take 1 tablet by mouth at bedtime.    Marland Kitchen oxyCODONE (OXY IR/ROXICODONE) 5 MG immediate release tablet Take one tablet by mouth every 6 hours as needed for  moderate pain (Patient taking differently: Take 10 mg by mouth every 12 (twelve) hours as needed for moderate pain. ) 120 tablet 0  . pantoprazole (PROTONIX) 40 MG tablet Take 40 mg by mouth daily.    . promethazine (PHENERGAN) 12.5 MG tablet Take 12.5 mg by mouth every Monday, Wednesday, and Friday with hemodialysis.     Marland Kitchen promethazine (PHENERGAN) 12.5 MG tablet Take 12.5 mg by mouth every 6 (six) hours as needed for nausea or vomiting.    . sertraline (ZOLOFT) 100 MG tablet Take 150 mg by mouth daily.     . sevelamer carbonate (RENVELA) 800 MG tablet Take 2,400 mg by mouth 3 (three) times daily with meals.     . sildenafil (REVATIO) 20 MG tablet Take 20 mg by mouth 3 (three) times daily.     . vitamin C (ASCORBIC ACID) 500 MG tablet Take 500 mg by mouth 2 (two) times daily.    . zaleplon (SONATA) 5 MG capsule Take one capsule by mouth every night at bedtime for rest (Patient taking differently: Take 10 mg by mouth at bedtime. ) 30 capsule 0  . zidovudine (RETROVIR) 100 MG capsule Take 100 mg by mouth 3 (three) times daily.       Current Facility-Administered Medications  Medication Dose Route Frequency Provider Last Rate Last Dose  . clopidogrel (PLAVIX) tablet 300 mg  300 mg Oral Once Waynetta Sandy, MD        Her indication for Plavix is unknown. Dr. Dorris Carnes is less cardiology note from 2017 reports atrial fibrillation, and she advised no anticoagulation.  ___________________________________________________________________ Objective   Exam:  BP 120/70 (BP Location: Left Leg, Patient Position: Sitting, Cuff Size: Normal)   Pulse 64   Ht 4\' 11"  (1.499 m) Comment: height measured without shoes  Wt 104 lb 2 oz (47.2 kg)   BMI 21.03 kg/m    General: this is a(n) feeble woman sitting in wheelchair doing across her puzzle, cannot get on exam table, which limits exam.   Eyes: sclera anicteric, no redness  ENT: oral mucosa moist without lesions, no cervical or  supraclavicular lymphadenopathy, edentulous  CV: RRR without murmur, S1/S2, trace peripheral edema  Resp: clear to auscultation bilaterally, normal RR and effort noted  GI: soft, no tenderness, with active bowel sounds. No guarding or palpable organomegaly noted.  Skin; warm and dry, no rash or jaundice noted  Neuro: awake, alert and oriented x 3. Normal gross motor function and fluent speech  She has multiple AV fistula on the arms  Labs:  In November 2018 hemoglobin 12.5 Ferratin 1402 transferrin 133  On 03/16/2017, WBC 3.1, hemoglobin 11.5 MCV 106 platelets 132  Radiologic Studies:  None for review  Assessment: Encounter Diagnoses  Name Primary?  . Non-intractable cyclical vomiting with nausea Yes  . ESRD on dialysis ([place])   . Paroxysmal atrial fibrillation ([facility])   . Human immunodeficiency virus (HIV) disease (H[facility])     Nausea and vomiting of uncertain duration and currently unclear cause. I am unable to characterize it due to the patient's very limited history. Therefore, the differential is broad from inflammatory to obstruction to functional (including gastric dysrhythmias seen more commonly in dialysis patients) to medication side effects such as her chronic opioids.  She is increased risk for endoscopic procedures due to her medical complexity, her indication for Plavix is uncertain.   Plan:  Conservative approach. I have recommended to her care team that they order and obtain result of an upper GI series mainly to rule out obstruction. If done, then I think alternate anti-emetics should be tried and I can see her as needed.  Thank you for the courtesy of this consult.  Please call me with any questions or concerns.  Nelida Meuse III  CC: Hilbert Corrigan, MD

## 2017-04-28 DIAGNOSIS — N2581 Secondary hyperparathyroidism of renal origin: Secondary | ICD-10-CM | POA: Diagnosis not present

## 2017-04-28 DIAGNOSIS — E611 Iron deficiency: Secondary | ICD-10-CM | POA: Diagnosis not present

## 2017-04-28 DIAGNOSIS — D472 Monoclonal gammopathy: Secondary | ICD-10-CM | POA: Diagnosis not present

## 2017-04-28 DIAGNOSIS — D631 Anemia in chronic kidney disease: Secondary | ICD-10-CM | POA: Diagnosis not present

## 2017-04-28 DIAGNOSIS — R112 Nausea with vomiting, unspecified: Secondary | ICD-10-CM | POA: Diagnosis not present

## 2017-04-28 DIAGNOSIS — D649 Anemia, unspecified: Secondary | ICD-10-CM | POA: Diagnosis not present

## 2017-04-28 DIAGNOSIS — N186 End stage renal disease: Secondary | ICD-10-CM | POA: Diagnosis not present

## 2017-04-30 DIAGNOSIS — N186 End stage renal disease: Secondary | ICD-10-CM | POA: Diagnosis not present

## 2017-04-30 DIAGNOSIS — D472 Monoclonal gammopathy: Secondary | ICD-10-CM | POA: Diagnosis not present

## 2017-04-30 DIAGNOSIS — N2581 Secondary hyperparathyroidism of renal origin: Secondary | ICD-10-CM | POA: Diagnosis not present

## 2017-04-30 DIAGNOSIS — Z992 Dependence on renal dialysis: Secondary | ICD-10-CM | POA: Diagnosis not present

## 2017-04-30 DIAGNOSIS — E611 Iron deficiency: Secondary | ICD-10-CM | POA: Diagnosis not present

## 2017-04-30 DIAGNOSIS — D631 Anemia in chronic kidney disease: Secondary | ICD-10-CM | POA: Diagnosis not present

## 2017-05-03 ENCOUNTER — Telehealth: Payer: Self-pay | Admitting: Gastroenterology

## 2017-05-03 NOTE — Telephone Encounter (Signed)
Lamount Cohen from pt nursing center states pt was suppose to be scheduled for gi series according to ov note on 2.26.19. Please advise and cb to schedule with nursing center.

## 2017-05-03 NOTE — Telephone Encounter (Signed)
  Per Dr Loletha Carrow, office note:  Plan: Conservative approach. I have recommended to her care team that they order and obtain result of an upper GI series mainly to rule out obstruction. If done, then I think alternate anti-emetics should be tried and I can see her as needed.  Left a message at the facility for a return call.

## 2017-05-03 NOTE — Telephone Encounter (Signed)
Routed to Toni. 

## 2017-05-05 DIAGNOSIS — D631 Anemia in chronic kidney disease: Secondary | ICD-10-CM | POA: Diagnosis not present

## 2017-05-05 DIAGNOSIS — N186 End stage renal disease: Secondary | ICD-10-CM | POA: Diagnosis not present

## 2017-05-05 DIAGNOSIS — D472 Monoclonal gammopathy: Secondary | ICD-10-CM | POA: Diagnosis not present

## 2017-05-05 DIAGNOSIS — E611 Iron deficiency: Secondary | ICD-10-CM | POA: Diagnosis not present

## 2017-05-05 DIAGNOSIS — Z992 Dependence on renal dialysis: Secondary | ICD-10-CM | POA: Diagnosis not present

## 2017-05-05 DIAGNOSIS — N2581 Secondary hyperparathyroidism of renal origin: Secondary | ICD-10-CM | POA: Diagnosis not present

## 2017-05-07 DIAGNOSIS — I2729 Other secondary pulmonary hypertension: Secondary | ICD-10-CM | POA: Diagnosis not present

## 2017-05-07 DIAGNOSIS — F419 Anxiety disorder, unspecified: Secondary | ICD-10-CM | POA: Diagnosis not present

## 2017-05-07 DIAGNOSIS — N186 End stage renal disease: Secondary | ICD-10-CM | POA: Diagnosis not present

## 2017-05-07 DIAGNOSIS — F339 Major depressive disorder, recurrent, unspecified: Secondary | ICD-10-CM | POA: Diagnosis not present

## 2017-05-10 DIAGNOSIS — N186 End stage renal disease: Secondary | ICD-10-CM | POA: Diagnosis not present

## 2017-05-10 DIAGNOSIS — D631 Anemia in chronic kidney disease: Secondary | ICD-10-CM | POA: Diagnosis not present

## 2017-05-10 DIAGNOSIS — N2581 Secondary hyperparathyroidism of renal origin: Secondary | ICD-10-CM | POA: Diagnosis not present

## 2017-05-10 DIAGNOSIS — E611 Iron deficiency: Secondary | ICD-10-CM | POA: Diagnosis not present

## 2017-05-10 DIAGNOSIS — Z992 Dependence on renal dialysis: Secondary | ICD-10-CM | POA: Diagnosis not present

## 2017-05-10 DIAGNOSIS — D472 Monoclonal gammopathy: Secondary | ICD-10-CM | POA: Diagnosis not present

## 2017-05-12 DIAGNOSIS — N186 End stage renal disease: Secondary | ICD-10-CM | POA: Diagnosis not present

## 2017-05-12 DIAGNOSIS — D472 Monoclonal gammopathy: Secondary | ICD-10-CM | POA: Diagnosis not present

## 2017-05-12 DIAGNOSIS — N2581 Secondary hyperparathyroidism of renal origin: Secondary | ICD-10-CM | POA: Diagnosis not present

## 2017-05-12 DIAGNOSIS — E611 Iron deficiency: Secondary | ICD-10-CM | POA: Diagnosis not present

## 2017-05-12 DIAGNOSIS — D631 Anemia in chronic kidney disease: Secondary | ICD-10-CM | POA: Diagnosis not present

## 2017-05-12 DIAGNOSIS — Z992 Dependence on renal dialysis: Secondary | ICD-10-CM | POA: Diagnosis not present

## 2017-05-14 DIAGNOSIS — D472 Monoclonal gammopathy: Secondary | ICD-10-CM | POA: Diagnosis not present

## 2017-05-14 DIAGNOSIS — D631 Anemia in chronic kidney disease: Secondary | ICD-10-CM | POA: Diagnosis not present

## 2017-05-14 DIAGNOSIS — Z992 Dependence on renal dialysis: Secondary | ICD-10-CM | POA: Diagnosis not present

## 2017-05-14 DIAGNOSIS — E611 Iron deficiency: Secondary | ICD-10-CM | POA: Diagnosis not present

## 2017-05-14 DIAGNOSIS — N2581 Secondary hyperparathyroidism of renal origin: Secondary | ICD-10-CM | POA: Diagnosis not present

## 2017-05-14 DIAGNOSIS — N186 End stage renal disease: Secondary | ICD-10-CM | POA: Diagnosis not present

## 2017-05-17 DIAGNOSIS — N2581 Secondary hyperparathyroidism of renal origin: Secondary | ICD-10-CM | POA: Diagnosis not present

## 2017-05-17 DIAGNOSIS — D631 Anemia in chronic kidney disease: Secondary | ICD-10-CM | POA: Diagnosis not present

## 2017-05-17 DIAGNOSIS — E611 Iron deficiency: Secondary | ICD-10-CM | POA: Diagnosis not present

## 2017-05-17 DIAGNOSIS — Z992 Dependence on renal dialysis: Secondary | ICD-10-CM | POA: Diagnosis not present

## 2017-05-17 DIAGNOSIS — N186 End stage renal disease: Secondary | ICD-10-CM | POA: Diagnosis not present

## 2017-05-17 DIAGNOSIS — D472 Monoclonal gammopathy: Secondary | ICD-10-CM | POA: Diagnosis not present

## 2017-05-19 DIAGNOSIS — E611 Iron deficiency: Secondary | ICD-10-CM | POA: Diagnosis not present

## 2017-05-19 DIAGNOSIS — N2581 Secondary hyperparathyroidism of renal origin: Secondary | ICD-10-CM | POA: Diagnosis not present

## 2017-05-19 DIAGNOSIS — N186 End stage renal disease: Secondary | ICD-10-CM | POA: Diagnosis not present

## 2017-05-19 DIAGNOSIS — D472 Monoclonal gammopathy: Secondary | ICD-10-CM | POA: Diagnosis not present

## 2017-05-19 DIAGNOSIS — Z992 Dependence on renal dialysis: Secondary | ICD-10-CM | POA: Diagnosis not present

## 2017-05-19 DIAGNOSIS — D631 Anemia in chronic kidney disease: Secondary | ICD-10-CM | POA: Diagnosis not present

## 2017-05-21 DIAGNOSIS — Z992 Dependence on renal dialysis: Secondary | ICD-10-CM | POA: Diagnosis not present

## 2017-05-21 DIAGNOSIS — N186 End stage renal disease: Secondary | ICD-10-CM | POA: Diagnosis not present

## 2017-05-21 DIAGNOSIS — Z8673 Personal history of transient ischemic attack (TIA), and cerebral infarction without residual deficits: Secondary | ICD-10-CM | POA: Diagnosis not present

## 2017-05-21 DIAGNOSIS — D631 Anemia in chronic kidney disease: Secondary | ICD-10-CM | POA: Diagnosis not present

## 2017-05-21 DIAGNOSIS — E611 Iron deficiency: Secondary | ICD-10-CM | POA: Diagnosis not present

## 2017-05-21 DIAGNOSIS — N2581 Secondary hyperparathyroidism of renal origin: Secondary | ICD-10-CM | POA: Diagnosis not present

## 2017-05-21 DIAGNOSIS — D472 Monoclonal gammopathy: Secondary | ICD-10-CM | POA: Diagnosis not present

## 2017-05-21 DIAGNOSIS — T82898A Other specified complication of vascular prosthetic devices, implants and grafts, initial encounter: Secondary | ICD-10-CM | POA: Diagnosis not present

## 2017-05-21 DIAGNOSIS — B20 Human immunodeficiency virus [HIV] disease: Secondary | ICD-10-CM | POA: Diagnosis not present

## 2017-05-24 DIAGNOSIS — D472 Monoclonal gammopathy: Secondary | ICD-10-CM | POA: Diagnosis not present

## 2017-05-24 DIAGNOSIS — N2581 Secondary hyperparathyroidism of renal origin: Secondary | ICD-10-CM | POA: Diagnosis not present

## 2017-05-24 DIAGNOSIS — E611 Iron deficiency: Secondary | ICD-10-CM | POA: Diagnosis not present

## 2017-05-24 DIAGNOSIS — Z992 Dependence on renal dialysis: Secondary | ICD-10-CM | POA: Diagnosis not present

## 2017-05-24 DIAGNOSIS — N186 End stage renal disease: Secondary | ICD-10-CM | POA: Diagnosis not present

## 2017-05-24 DIAGNOSIS — D631 Anemia in chronic kidney disease: Secondary | ICD-10-CM | POA: Diagnosis not present

## 2017-05-26 DIAGNOSIS — D472 Monoclonal gammopathy: Secondary | ICD-10-CM | POA: Diagnosis not present

## 2017-05-26 DIAGNOSIS — Z992 Dependence on renal dialysis: Secondary | ICD-10-CM | POA: Diagnosis not present

## 2017-05-26 DIAGNOSIS — N2581 Secondary hyperparathyroidism of renal origin: Secondary | ICD-10-CM | POA: Diagnosis not present

## 2017-05-26 DIAGNOSIS — N186 End stage renal disease: Secondary | ICD-10-CM | POA: Diagnosis not present

## 2017-05-26 DIAGNOSIS — E611 Iron deficiency: Secondary | ICD-10-CM | POA: Diagnosis not present

## 2017-05-26 DIAGNOSIS — D631 Anemia in chronic kidney disease: Secondary | ICD-10-CM | POA: Diagnosis not present

## 2017-05-27 DIAGNOSIS — D649 Anemia, unspecified: Secondary | ICD-10-CM | POA: Diagnosis not present

## 2017-05-28 DIAGNOSIS — D472 Monoclonal gammopathy: Secondary | ICD-10-CM | POA: Diagnosis not present

## 2017-05-28 DIAGNOSIS — N186 End stage renal disease: Secondary | ICD-10-CM | POA: Diagnosis not present

## 2017-05-28 DIAGNOSIS — Z992 Dependence on renal dialysis: Secondary | ICD-10-CM | POA: Diagnosis not present

## 2017-05-28 DIAGNOSIS — E611 Iron deficiency: Secondary | ICD-10-CM | POA: Diagnosis not present

## 2017-05-28 DIAGNOSIS — N2581 Secondary hyperparathyroidism of renal origin: Secondary | ICD-10-CM | POA: Diagnosis not present

## 2017-05-28 DIAGNOSIS — D631 Anemia in chronic kidney disease: Secondary | ICD-10-CM | POA: Diagnosis not present

## 2017-05-30 DIAGNOSIS — Z992 Dependence on renal dialysis: Secondary | ICD-10-CM | POA: Diagnosis not present

## 2017-05-30 DIAGNOSIS — N186 End stage renal disease: Secondary | ICD-10-CM | POA: Diagnosis not present

## 2017-05-31 DIAGNOSIS — D472 Monoclonal gammopathy: Secondary | ICD-10-CM | POA: Diagnosis not present

## 2017-05-31 DIAGNOSIS — D509 Iron deficiency anemia, unspecified: Secondary | ICD-10-CM | POA: Diagnosis not present

## 2017-05-31 DIAGNOSIS — D631 Anemia in chronic kidney disease: Secondary | ICD-10-CM | POA: Diagnosis not present

## 2017-05-31 DIAGNOSIS — N2581 Secondary hyperparathyroidism of renal origin: Secondary | ICD-10-CM | POA: Diagnosis not present

## 2017-05-31 DIAGNOSIS — N186 End stage renal disease: Secondary | ICD-10-CM | POA: Diagnosis not present

## 2017-05-31 DIAGNOSIS — Z992 Dependence on renal dialysis: Secondary | ICD-10-CM | POA: Diagnosis not present

## 2017-06-02 DIAGNOSIS — D509 Iron deficiency anemia, unspecified: Secondary | ICD-10-CM | POA: Diagnosis not present

## 2017-06-02 DIAGNOSIS — N186 End stage renal disease: Secondary | ICD-10-CM | POA: Diagnosis not present

## 2017-06-02 DIAGNOSIS — Z992 Dependence on renal dialysis: Secondary | ICD-10-CM | POA: Diagnosis not present

## 2017-06-02 DIAGNOSIS — D631 Anemia in chronic kidney disease: Secondary | ICD-10-CM | POA: Diagnosis not present

## 2017-06-02 DIAGNOSIS — N2581 Secondary hyperparathyroidism of renal origin: Secondary | ICD-10-CM | POA: Diagnosis not present

## 2017-06-02 DIAGNOSIS — D472 Monoclonal gammopathy: Secondary | ICD-10-CM | POA: Diagnosis not present

## 2017-06-04 DIAGNOSIS — N186 End stage renal disease: Secondary | ICD-10-CM | POA: Diagnosis not present

## 2017-06-04 DIAGNOSIS — N2581 Secondary hyperparathyroidism of renal origin: Secondary | ICD-10-CM | POA: Diagnosis not present

## 2017-06-04 DIAGNOSIS — D472 Monoclonal gammopathy: Secondary | ICD-10-CM | POA: Diagnosis not present

## 2017-06-04 DIAGNOSIS — D631 Anemia in chronic kidney disease: Secondary | ICD-10-CM | POA: Diagnosis not present

## 2017-06-04 DIAGNOSIS — Z992 Dependence on renal dialysis: Secondary | ICD-10-CM | POA: Diagnosis not present

## 2017-06-04 DIAGNOSIS — D509 Iron deficiency anemia, unspecified: Secondary | ICD-10-CM | POA: Diagnosis not present

## 2017-06-07 DIAGNOSIS — D472 Monoclonal gammopathy: Secondary | ICD-10-CM | POA: Diagnosis not present

## 2017-06-07 DIAGNOSIS — N186 End stage renal disease: Secondary | ICD-10-CM | POA: Diagnosis not present

## 2017-06-07 DIAGNOSIS — N2581 Secondary hyperparathyroidism of renal origin: Secondary | ICD-10-CM | POA: Diagnosis not present

## 2017-06-07 DIAGNOSIS — D631 Anemia in chronic kidney disease: Secondary | ICD-10-CM | POA: Diagnosis not present

## 2017-06-07 DIAGNOSIS — D509 Iron deficiency anemia, unspecified: Secondary | ICD-10-CM | POA: Diagnosis not present

## 2017-06-07 DIAGNOSIS — Z992 Dependence on renal dialysis: Secondary | ICD-10-CM | POA: Diagnosis not present

## 2017-06-09 DIAGNOSIS — N186 End stage renal disease: Secondary | ICD-10-CM | POA: Diagnosis not present

## 2017-06-09 DIAGNOSIS — D631 Anemia in chronic kidney disease: Secondary | ICD-10-CM | POA: Diagnosis not present

## 2017-06-09 DIAGNOSIS — Z992 Dependence on renal dialysis: Secondary | ICD-10-CM | POA: Diagnosis not present

## 2017-06-09 DIAGNOSIS — N2581 Secondary hyperparathyroidism of renal origin: Secondary | ICD-10-CM | POA: Diagnosis not present

## 2017-06-09 DIAGNOSIS — D472 Monoclonal gammopathy: Secondary | ICD-10-CM | POA: Diagnosis not present

## 2017-06-09 DIAGNOSIS — D509 Iron deficiency anemia, unspecified: Secondary | ICD-10-CM | POA: Diagnosis not present

## 2017-06-11 DIAGNOSIS — D631 Anemia in chronic kidney disease: Secondary | ICD-10-CM | POA: Diagnosis not present

## 2017-06-11 DIAGNOSIS — D509 Iron deficiency anemia, unspecified: Secondary | ICD-10-CM | POA: Diagnosis not present

## 2017-06-11 DIAGNOSIS — N2581 Secondary hyperparathyroidism of renal origin: Secondary | ICD-10-CM | POA: Diagnosis not present

## 2017-06-11 DIAGNOSIS — D472 Monoclonal gammopathy: Secondary | ICD-10-CM | POA: Diagnosis not present

## 2017-06-11 DIAGNOSIS — Z992 Dependence on renal dialysis: Secondary | ICD-10-CM | POA: Diagnosis not present

## 2017-06-11 DIAGNOSIS — N186 End stage renal disease: Secondary | ICD-10-CM | POA: Diagnosis not present

## 2017-06-14 DIAGNOSIS — N2581 Secondary hyperparathyroidism of renal origin: Secondary | ICD-10-CM | POA: Diagnosis not present

## 2017-06-14 DIAGNOSIS — Z992 Dependence on renal dialysis: Secondary | ICD-10-CM | POA: Diagnosis not present

## 2017-06-14 DIAGNOSIS — D631 Anemia in chronic kidney disease: Secondary | ICD-10-CM | POA: Diagnosis not present

## 2017-06-14 DIAGNOSIS — N186 End stage renal disease: Secondary | ICD-10-CM | POA: Diagnosis not present

## 2017-06-14 DIAGNOSIS — D509 Iron deficiency anemia, unspecified: Secondary | ICD-10-CM | POA: Diagnosis not present

## 2017-06-14 DIAGNOSIS — D472 Monoclonal gammopathy: Secondary | ICD-10-CM | POA: Diagnosis not present

## 2017-06-16 DIAGNOSIS — Z992 Dependence on renal dialysis: Secondary | ICD-10-CM | POA: Diagnosis not present

## 2017-06-16 DIAGNOSIS — N2581 Secondary hyperparathyroidism of renal origin: Secondary | ICD-10-CM | POA: Diagnosis not present

## 2017-06-16 DIAGNOSIS — N186 End stage renal disease: Secondary | ICD-10-CM | POA: Diagnosis not present

## 2017-06-16 DIAGNOSIS — D509 Iron deficiency anemia, unspecified: Secondary | ICD-10-CM | POA: Diagnosis not present

## 2017-06-16 DIAGNOSIS — D631 Anemia in chronic kidney disease: Secondary | ICD-10-CM | POA: Diagnosis not present

## 2017-06-16 DIAGNOSIS — D472 Monoclonal gammopathy: Secondary | ICD-10-CM | POA: Diagnosis not present

## 2017-06-17 DIAGNOSIS — R112 Nausea with vomiting, unspecified: Secondary | ICD-10-CM | POA: Diagnosis not present

## 2017-06-17 DIAGNOSIS — R748 Abnormal levels of other serum enzymes: Secondary | ICD-10-CM | POA: Diagnosis not present

## 2017-06-17 DIAGNOSIS — N186 End stage renal disease: Secondary | ICD-10-CM | POA: Diagnosis not present

## 2017-06-18 DIAGNOSIS — D631 Anemia in chronic kidney disease: Secondary | ICD-10-CM | POA: Diagnosis not present

## 2017-06-18 DIAGNOSIS — N186 End stage renal disease: Secondary | ICD-10-CM | POA: Diagnosis not present

## 2017-06-18 DIAGNOSIS — D472 Monoclonal gammopathy: Secondary | ICD-10-CM | POA: Diagnosis not present

## 2017-06-18 DIAGNOSIS — Z992 Dependence on renal dialysis: Secondary | ICD-10-CM | POA: Diagnosis not present

## 2017-06-18 DIAGNOSIS — N2581 Secondary hyperparathyroidism of renal origin: Secondary | ICD-10-CM | POA: Diagnosis not present

## 2017-06-18 DIAGNOSIS — D509 Iron deficiency anemia, unspecified: Secondary | ICD-10-CM | POA: Diagnosis not present

## 2017-06-21 DIAGNOSIS — D631 Anemia in chronic kidney disease: Secondary | ICD-10-CM | POA: Diagnosis not present

## 2017-06-21 DIAGNOSIS — N186 End stage renal disease: Secondary | ICD-10-CM | POA: Diagnosis not present

## 2017-06-21 DIAGNOSIS — D509 Iron deficiency anemia, unspecified: Secondary | ICD-10-CM | POA: Diagnosis not present

## 2017-06-21 DIAGNOSIS — Z992 Dependence on renal dialysis: Secondary | ICD-10-CM | POA: Diagnosis not present

## 2017-06-21 DIAGNOSIS — D472 Monoclonal gammopathy: Secondary | ICD-10-CM | POA: Diagnosis not present

## 2017-06-21 DIAGNOSIS — N2581 Secondary hyperparathyroidism of renal origin: Secondary | ICD-10-CM | POA: Diagnosis not present

## 2017-06-23 DIAGNOSIS — Z992 Dependence on renal dialysis: Secondary | ICD-10-CM | POA: Diagnosis not present

## 2017-06-23 DIAGNOSIS — N186 End stage renal disease: Secondary | ICD-10-CM | POA: Diagnosis not present

## 2017-06-23 DIAGNOSIS — D631 Anemia in chronic kidney disease: Secondary | ICD-10-CM | POA: Diagnosis not present

## 2017-06-23 DIAGNOSIS — D509 Iron deficiency anemia, unspecified: Secondary | ICD-10-CM | POA: Diagnosis not present

## 2017-06-23 DIAGNOSIS — D472 Monoclonal gammopathy: Secondary | ICD-10-CM | POA: Diagnosis not present

## 2017-06-23 DIAGNOSIS — N2581 Secondary hyperparathyroidism of renal origin: Secondary | ICD-10-CM | POA: Diagnosis not present

## 2017-06-24 DIAGNOSIS — Z79899 Other long term (current) drug therapy: Secondary | ICD-10-CM | POA: Diagnosis not present

## 2017-06-28 DIAGNOSIS — D509 Iron deficiency anemia, unspecified: Secondary | ICD-10-CM | POA: Diagnosis not present

## 2017-06-28 DIAGNOSIS — D631 Anemia in chronic kidney disease: Secondary | ICD-10-CM | POA: Diagnosis not present

## 2017-06-28 DIAGNOSIS — N186 End stage renal disease: Secondary | ICD-10-CM | POA: Diagnosis not present

## 2017-06-28 DIAGNOSIS — N2581 Secondary hyperparathyroidism of renal origin: Secondary | ICD-10-CM | POA: Diagnosis not present

## 2017-06-28 DIAGNOSIS — D472 Monoclonal gammopathy: Secondary | ICD-10-CM | POA: Diagnosis not present

## 2017-06-28 DIAGNOSIS — Z992 Dependence on renal dialysis: Secondary | ICD-10-CM | POA: Diagnosis not present

## 2017-06-29 DIAGNOSIS — N186 End stage renal disease: Secondary | ICD-10-CM | POA: Diagnosis not present

## 2017-06-29 DIAGNOSIS — Z992 Dependence on renal dialysis: Secondary | ICD-10-CM | POA: Diagnosis not present

## 2017-06-30 DIAGNOSIS — D472 Monoclonal gammopathy: Secondary | ICD-10-CM | POA: Diagnosis not present

## 2017-06-30 DIAGNOSIS — E611 Iron deficiency: Secondary | ICD-10-CM | POA: Diagnosis not present

## 2017-06-30 DIAGNOSIS — D631 Anemia in chronic kidney disease: Secondary | ICD-10-CM | POA: Diagnosis not present

## 2017-06-30 DIAGNOSIS — N2581 Secondary hyperparathyroidism of renal origin: Secondary | ICD-10-CM | POA: Diagnosis not present

## 2017-06-30 DIAGNOSIS — Z992 Dependence on renal dialysis: Secondary | ICD-10-CM | POA: Diagnosis not present

## 2017-06-30 DIAGNOSIS — R112 Nausea with vomiting, unspecified: Secondary | ICD-10-CM | POA: Diagnosis not present

## 2017-06-30 DIAGNOSIS — N186 End stage renal disease: Secondary | ICD-10-CM | POA: Diagnosis not present

## 2017-07-02 DIAGNOSIS — F5101 Primary insomnia: Secondary | ICD-10-CM | POA: Diagnosis not present

## 2017-07-02 DIAGNOSIS — D472 Monoclonal gammopathy: Secondary | ICD-10-CM | POA: Diagnosis not present

## 2017-07-02 DIAGNOSIS — R112 Nausea with vomiting, unspecified: Secondary | ICD-10-CM | POA: Diagnosis not present

## 2017-07-02 DIAGNOSIS — D631 Anemia in chronic kidney disease: Secondary | ICD-10-CM | POA: Diagnosis not present

## 2017-07-02 DIAGNOSIS — N186 End stage renal disease: Secondary | ICD-10-CM | POA: Diagnosis not present

## 2017-07-02 DIAGNOSIS — N2581 Secondary hyperparathyroidism of renal origin: Secondary | ICD-10-CM | POA: Diagnosis not present

## 2017-07-02 DIAGNOSIS — F411 Generalized anxiety disorder: Secondary | ICD-10-CM | POA: Diagnosis not present

## 2017-07-02 DIAGNOSIS — E611 Iron deficiency: Secondary | ICD-10-CM | POA: Diagnosis not present

## 2017-07-02 DIAGNOSIS — F331 Major depressive disorder, recurrent, moderate: Secondary | ICD-10-CM | POA: Diagnosis not present

## 2017-07-03 DIAGNOSIS — N2581 Secondary hyperparathyroidism of renal origin: Secondary | ICD-10-CM | POA: Diagnosis not present

## 2017-07-03 DIAGNOSIS — D631 Anemia in chronic kidney disease: Secondary | ICD-10-CM | POA: Diagnosis not present

## 2017-07-03 DIAGNOSIS — E611 Iron deficiency: Secondary | ICD-10-CM | POA: Diagnosis not present

## 2017-07-03 DIAGNOSIS — D472 Monoclonal gammopathy: Secondary | ICD-10-CM | POA: Diagnosis not present

## 2017-07-03 DIAGNOSIS — N186 End stage renal disease: Secondary | ICD-10-CM | POA: Diagnosis not present

## 2017-07-03 DIAGNOSIS — R112 Nausea with vomiting, unspecified: Secondary | ICD-10-CM | POA: Diagnosis not present

## 2017-07-05 DIAGNOSIS — E611 Iron deficiency: Secondary | ICD-10-CM | POA: Diagnosis not present

## 2017-07-05 DIAGNOSIS — N186 End stage renal disease: Secondary | ICD-10-CM | POA: Diagnosis not present

## 2017-07-05 DIAGNOSIS — N2581 Secondary hyperparathyroidism of renal origin: Secondary | ICD-10-CM | POA: Diagnosis not present

## 2017-07-05 DIAGNOSIS — D472 Monoclonal gammopathy: Secondary | ICD-10-CM | POA: Diagnosis not present

## 2017-07-05 DIAGNOSIS — D631 Anemia in chronic kidney disease: Secondary | ICD-10-CM | POA: Diagnosis not present

## 2017-07-05 DIAGNOSIS — R112 Nausea with vomiting, unspecified: Secondary | ICD-10-CM | POA: Diagnosis not present

## 2017-07-07 DIAGNOSIS — R112 Nausea with vomiting, unspecified: Secondary | ICD-10-CM | POA: Diagnosis not present

## 2017-07-07 DIAGNOSIS — D472 Monoclonal gammopathy: Secondary | ICD-10-CM | POA: Diagnosis not present

## 2017-07-07 DIAGNOSIS — N186 End stage renal disease: Secondary | ICD-10-CM | POA: Diagnosis not present

## 2017-07-07 DIAGNOSIS — D631 Anemia in chronic kidney disease: Secondary | ICD-10-CM | POA: Diagnosis not present

## 2017-07-07 DIAGNOSIS — E611 Iron deficiency: Secondary | ICD-10-CM | POA: Diagnosis not present

## 2017-07-07 DIAGNOSIS — N2581 Secondary hyperparathyroidism of renal origin: Secondary | ICD-10-CM | POA: Diagnosis not present

## 2017-07-09 DIAGNOSIS — N186 End stage renal disease: Secondary | ICD-10-CM | POA: Diagnosis not present

## 2017-07-09 DIAGNOSIS — N2581 Secondary hyperparathyroidism of renal origin: Secondary | ICD-10-CM | POA: Diagnosis not present

## 2017-07-09 DIAGNOSIS — D631 Anemia in chronic kidney disease: Secondary | ICD-10-CM | POA: Diagnosis not present

## 2017-07-09 DIAGNOSIS — D472 Monoclonal gammopathy: Secondary | ICD-10-CM | POA: Diagnosis not present

## 2017-07-09 DIAGNOSIS — R112 Nausea with vomiting, unspecified: Secondary | ICD-10-CM | POA: Diagnosis not present

## 2017-07-09 DIAGNOSIS — E611 Iron deficiency: Secondary | ICD-10-CM | POA: Diagnosis not present

## 2017-07-12 DIAGNOSIS — D631 Anemia in chronic kidney disease: Secondary | ICD-10-CM | POA: Diagnosis not present

## 2017-07-12 DIAGNOSIS — N186 End stage renal disease: Secondary | ICD-10-CM | POA: Diagnosis not present

## 2017-07-12 DIAGNOSIS — R112 Nausea with vomiting, unspecified: Secondary | ICD-10-CM | POA: Diagnosis not present

## 2017-07-12 DIAGNOSIS — E611 Iron deficiency: Secondary | ICD-10-CM | POA: Diagnosis not present

## 2017-07-12 DIAGNOSIS — D472 Monoclonal gammopathy: Secondary | ICD-10-CM | POA: Diagnosis not present

## 2017-07-12 DIAGNOSIS — N2581 Secondary hyperparathyroidism of renal origin: Secondary | ICD-10-CM | POA: Diagnosis not present

## 2017-07-13 ENCOUNTER — Other Ambulatory Visit (HOSPITAL_COMMUNITY): Payer: Self-pay | Admitting: Internal Medicine

## 2017-07-13 DIAGNOSIS — F339 Major depressive disorder, recurrent, unspecified: Secondary | ICD-10-CM | POA: Diagnosis not present

## 2017-07-13 DIAGNOSIS — N186 End stage renal disease: Secondary | ICD-10-CM | POA: Diagnosis not present

## 2017-07-13 DIAGNOSIS — R112 Nausea with vomiting, unspecified: Secondary | ICD-10-CM

## 2017-07-13 DIAGNOSIS — F419 Anxiety disorder, unspecified: Secondary | ICD-10-CM | POA: Diagnosis not present

## 2017-07-13 DIAGNOSIS — Z79899 Other long term (current) drug therapy: Secondary | ICD-10-CM | POA: Diagnosis not present

## 2017-07-13 DIAGNOSIS — I2729 Other secondary pulmonary hypertension: Secondary | ICD-10-CM | POA: Diagnosis not present

## 2017-07-14 DIAGNOSIS — N2581 Secondary hyperparathyroidism of renal origin: Secondary | ICD-10-CM | POA: Diagnosis not present

## 2017-07-14 DIAGNOSIS — E611 Iron deficiency: Secondary | ICD-10-CM | POA: Diagnosis not present

## 2017-07-14 DIAGNOSIS — D631 Anemia in chronic kidney disease: Secondary | ICD-10-CM | POA: Diagnosis not present

## 2017-07-14 DIAGNOSIS — R112 Nausea with vomiting, unspecified: Secondary | ICD-10-CM | POA: Diagnosis not present

## 2017-07-14 DIAGNOSIS — N186 End stage renal disease: Secondary | ICD-10-CM | POA: Diagnosis not present

## 2017-07-14 DIAGNOSIS — D472 Monoclonal gammopathy: Secondary | ICD-10-CM | POA: Diagnosis not present

## 2017-07-16 DIAGNOSIS — D472 Monoclonal gammopathy: Secondary | ICD-10-CM | POA: Diagnosis not present

## 2017-07-16 DIAGNOSIS — R112 Nausea with vomiting, unspecified: Secondary | ICD-10-CM | POA: Diagnosis not present

## 2017-07-16 DIAGNOSIS — D631 Anemia in chronic kidney disease: Secondary | ICD-10-CM | POA: Diagnosis not present

## 2017-07-16 DIAGNOSIS — N186 End stage renal disease: Secondary | ICD-10-CM | POA: Diagnosis not present

## 2017-07-16 DIAGNOSIS — E611 Iron deficiency: Secondary | ICD-10-CM | POA: Diagnosis not present

## 2017-07-16 DIAGNOSIS — N2581 Secondary hyperparathyroidism of renal origin: Secondary | ICD-10-CM | POA: Diagnosis not present

## 2017-07-19 DIAGNOSIS — D472 Monoclonal gammopathy: Secondary | ICD-10-CM | POA: Diagnosis not present

## 2017-07-19 DIAGNOSIS — D631 Anemia in chronic kidney disease: Secondary | ICD-10-CM | POA: Diagnosis not present

## 2017-07-19 DIAGNOSIS — R112 Nausea with vomiting, unspecified: Secondary | ICD-10-CM | POA: Diagnosis not present

## 2017-07-19 DIAGNOSIS — N186 End stage renal disease: Secondary | ICD-10-CM | POA: Diagnosis not present

## 2017-07-19 DIAGNOSIS — N2581 Secondary hyperparathyroidism of renal origin: Secondary | ICD-10-CM | POA: Diagnosis not present

## 2017-07-19 DIAGNOSIS — E611 Iron deficiency: Secondary | ICD-10-CM | POA: Diagnosis not present

## 2017-07-22 ENCOUNTER — Ambulatory Visit (HOSPITAL_COMMUNITY)
Admission: RE | Admit: 2017-07-22 | Discharge: 2017-07-22 | Disposition: A | Discharge status: Home / Self Care | Payer: Medicare Other | Source: Ambulatory Visit | Attending: Internal Medicine | Admitting: Internal Medicine

## 2017-07-22 DIAGNOSIS — K224 Dyskinesia of esophagus: Secondary | ICD-10-CM | POA: Insufficient documentation

## 2017-07-22 DIAGNOSIS — K3189 Other diseases of stomach and duodenum: Secondary | ICD-10-CM | POA: Insufficient documentation

## 2017-07-22 DIAGNOSIS — K219 Gastro-esophageal reflux disease without esophagitis: Secondary | ICD-10-CM | POA: Diagnosis not present

## 2017-07-22 DIAGNOSIS — R112 Nausea with vomiting, unspecified: Secondary | ICD-10-CM

## 2017-07-22 IMAGING — RF DG UGI W/ HIGH DENSITY W/KUB
13 of 19 series · 14 of 24 positions shown · non-contrast
Comparison: None

CLINICAL DATA: Nausea and vomiting after eating food for 6 months
occasionally with liquids

EXAM:
UPPER GI SERIES WITH KUB
TECHNIQUE: After obtaining a scout radiograph a routine upper GI series was
performed using thin and high density barium.
FLUOROSCOPY TIME:  Fluoroscopy Time:  1 minutes 36 seconds
Radiation Exposure Index (if provided by the fluoroscopic device):
33.4 mGy
Number of Acquired Spot Images: 3+ spots

[Series 2: t abdomen supine · 0.15mm/px · 1 of 1 slices shown]
[im 1/1]
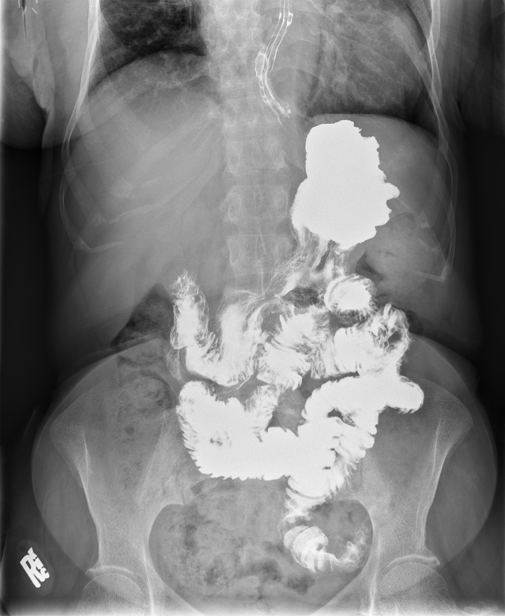

[Series 6: cp_standard · 0.19mm/px · 1 of 1 slices shown (1 of 12)]
[im 1/1]
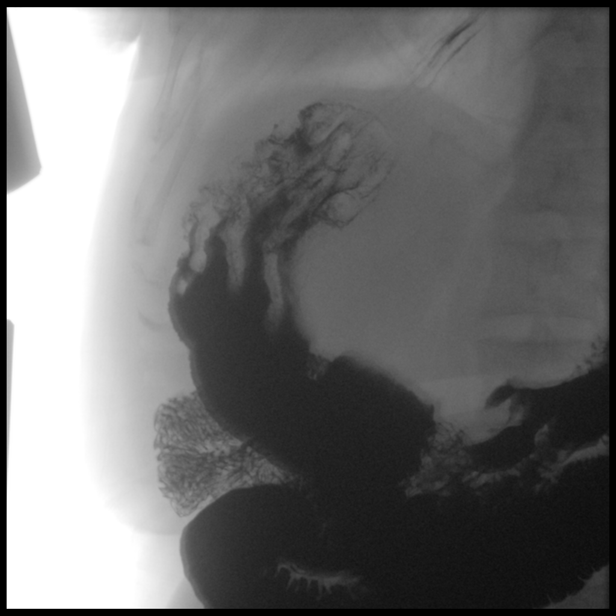

[Series 7: cp_standard · 0.19mm/px · 1 of 83 frames shown (2 of 12)]
[frame 42/83]
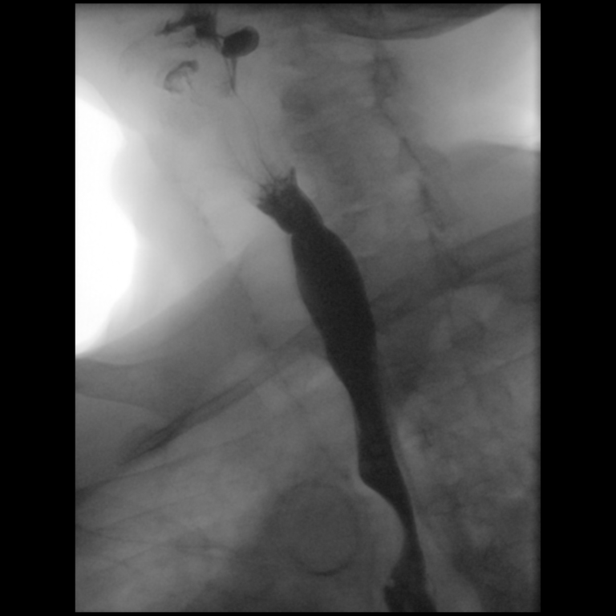

[Series 8: cp_standard · 0.19mm/px · 2 of 23 frames shown (3 of 12)]
[frame 4/23]
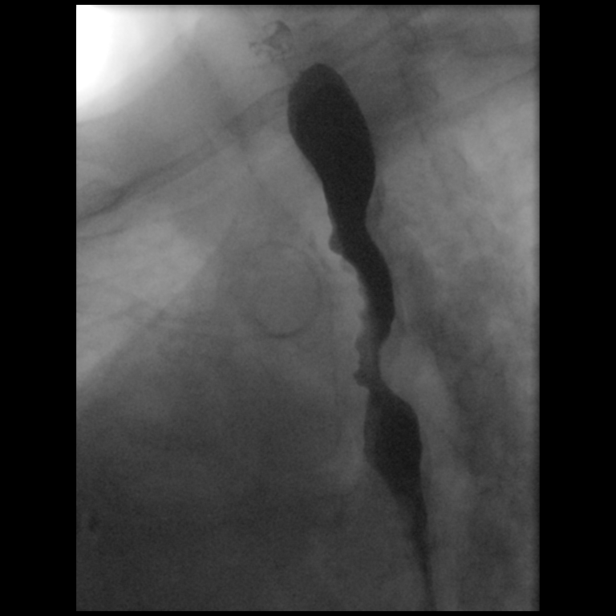
[frame 12/23]
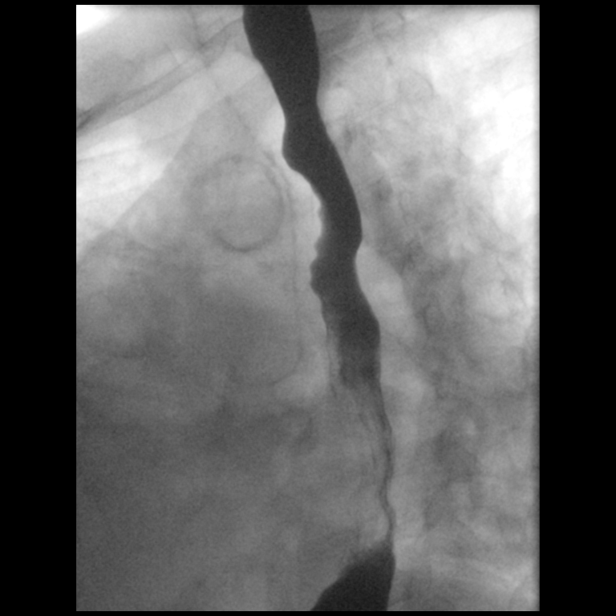

[Series 9: cp_standard · 0.19mm/px · 1 of 56 frames shown (4 of 12)]
[frame 13/56]
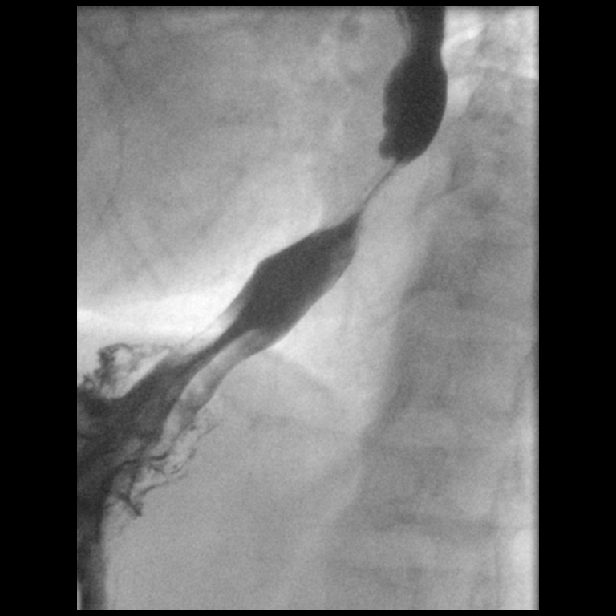

[Series 10: cp_standard · 0.19mm/px · 1 of 1 slices shown (5 of 12)]
[im 1/1]
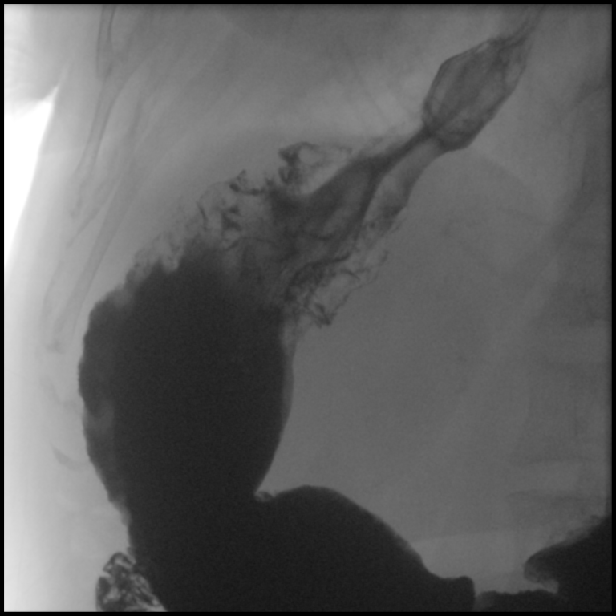

[Series 11: cp_standard · 0.19mm/px · 1 of 1 slices shown (6 of 12)]
[im 1/1]
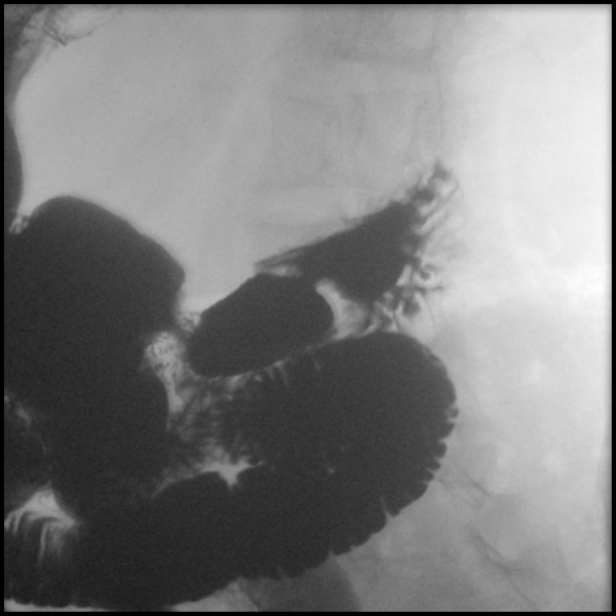

[Series 14: cp_standard · 0.19mm/px · 1 of 1 slices shown (7 of 12)]
[im 1/1]
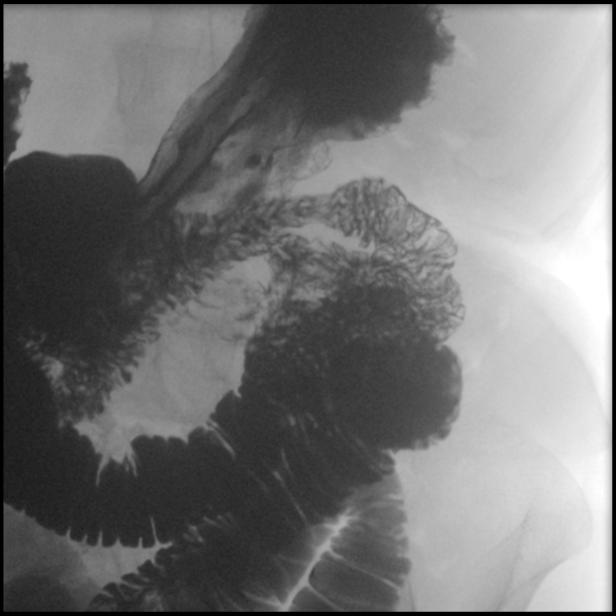

[Series 17: cp_standard · 0.19mm/px · 1 of 1 slices shown (8 of 12)]
[im 1/1]
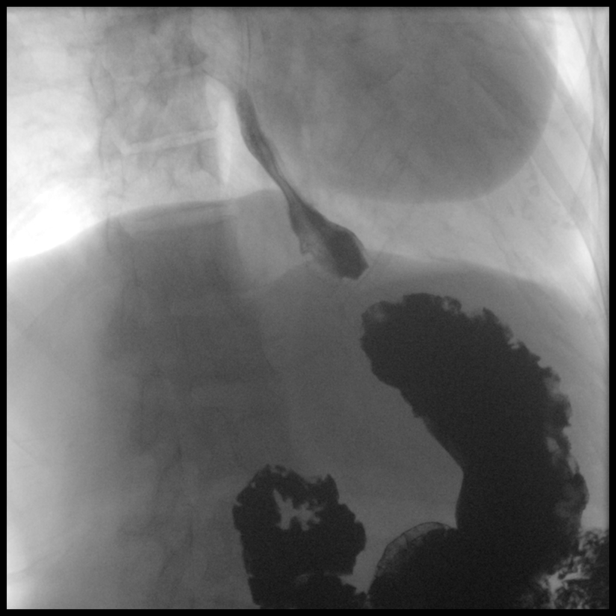

[Series 19: cp_standard · 0.18mm/px · 1 of 1 slices shown (9 of 12)]
[im 1/1]
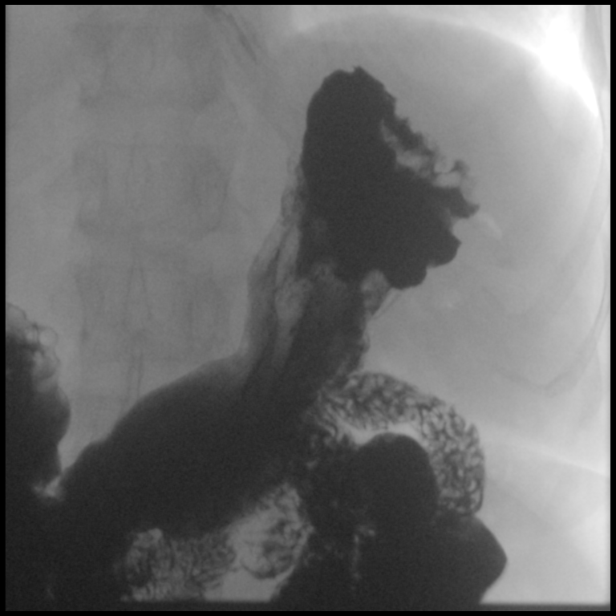

[Series 21: cp_standard · 0.19mm/px · 1 of 1 slices shown (10 of 12)]
[im 1/1]
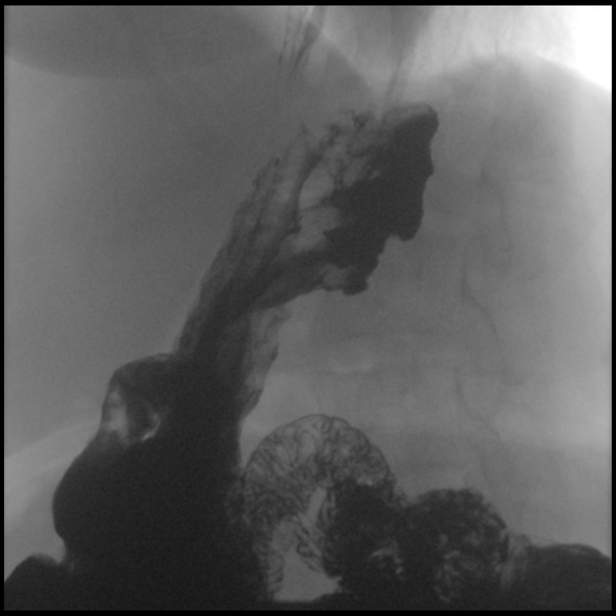

[Series 23: cp_standard · 0.19mm/px · 1 of 1 slices shown (11 of 12)]
[im 1/1]
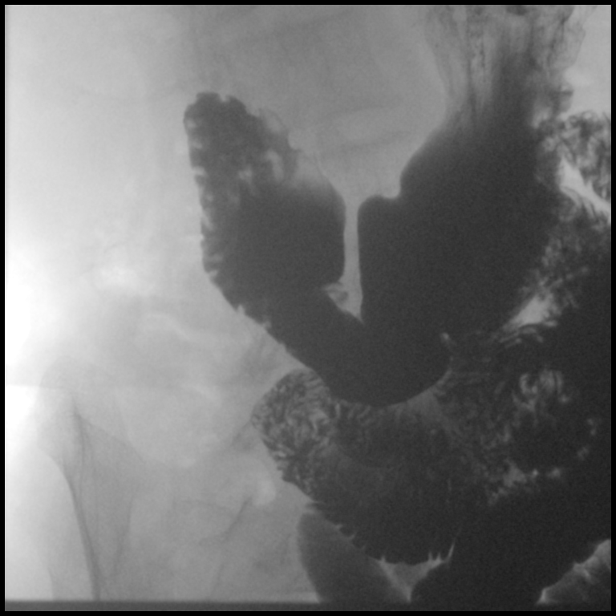

[Series 26: cp_standard · 0.19mm/px · 1 of 1 slices shown (12 of 12)]
[im 1/1]
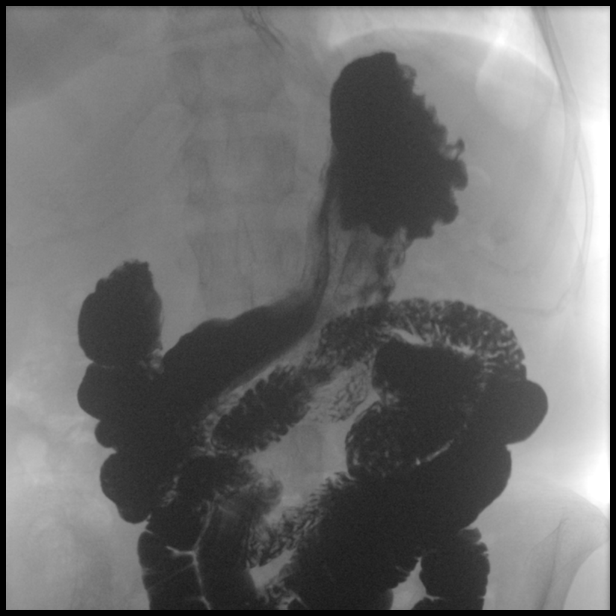

[14 of 24 positions shown; findings below may reference images not displayed]

FINDINGS: Nonobstructive bowel gas pattern on scout image.

Minimally prominent stool in colon.

Heart enlarged.

Lung bases clear.

Bones demineralized.

Normal esophageal distention without mass or stricture.

Age related mild esophageal dysmotility.

Mild gastroesophageal reflux identified into the distal third of the
esophagus during the exam.

Stomach distends normally with thickening of rugal folds
particularly at the fundus and proximal body question gastritis
though infiltrative processes can cause a similar appearance.

No definite mass or ulceration.

No gastric outlet obstruction.

Duodenal bulb and sweep normal appearance.

Ligament of Treitz normal position.

Jejunal loops unremarkable.
IMPRESSION: Age-related esophageal dysmotility.

Mild gastroesophageal reflux.

Thickening of rugal folds at the gastric fundus and proximal gastric
body, can be seen with gastritis and with infiltrative processes
including tumor.

Correlation with upper endoscopy recommended.

## 2017-07-23 ENCOUNTER — Emergency Department (HOSPITAL_COMMUNITY): Payer: Medicare Other

## 2017-07-23 ENCOUNTER — Encounter (HOSPITAL_COMMUNITY): Payer: Self-pay

## 2017-07-23 ENCOUNTER — Emergency Department (HOSPITAL_COMMUNITY)
Admission: EM | Admit: 2017-07-23 | Discharge: 2017-07-23 | Disposition: A | Discharge status: Skilled Nursing Facility | Payer: Medicare Other | Attending: Emergency Medicine | Admitting: Emergency Medicine

## 2017-07-23 DIAGNOSIS — Z91040 Latex allergy status: Secondary | ICD-10-CM | POA: Insufficient documentation

## 2017-07-23 DIAGNOSIS — G4489 Other headache syndrome: Secondary | ICD-10-CM | POA: Diagnosis not present

## 2017-07-23 DIAGNOSIS — R519 Headache, unspecified: Secondary | ICD-10-CM

## 2017-07-23 DIAGNOSIS — Z87891 Personal history of nicotine dependence: Secondary | ICD-10-CM | POA: Diagnosis not present

## 2017-07-23 DIAGNOSIS — N186 End stage renal disease: Secondary | ICD-10-CM | POA: Diagnosis not present

## 2017-07-23 DIAGNOSIS — I509 Heart failure, unspecified: Secondary | ICD-10-CM | POA: Diagnosis not present

## 2017-07-23 DIAGNOSIS — R51 Headache: Secondary | ICD-10-CM | POA: Insufficient documentation

## 2017-07-23 DIAGNOSIS — D472 Monoclonal gammopathy: Secondary | ICD-10-CM | POA: Diagnosis not present

## 2017-07-23 DIAGNOSIS — Z992 Dependence on renal dialysis: Secondary | ICD-10-CM | POA: Diagnosis not present

## 2017-07-23 DIAGNOSIS — Z7902 Long term (current) use of antithrombotics/antiplatelets: Secondary | ICD-10-CM | POA: Insufficient documentation

## 2017-07-23 DIAGNOSIS — N2581 Secondary hyperparathyroidism of renal origin: Secondary | ICD-10-CM | POA: Diagnosis not present

## 2017-07-23 DIAGNOSIS — R531 Weakness: Secondary | ICD-10-CM | POA: Diagnosis not present

## 2017-07-23 DIAGNOSIS — E611 Iron deficiency: Secondary | ICD-10-CM | POA: Diagnosis not present

## 2017-07-23 DIAGNOSIS — Z79899 Other long term (current) drug therapy: Secondary | ICD-10-CM | POA: Insufficient documentation

## 2017-07-23 DIAGNOSIS — I132 Hypertensive heart and chronic kidney disease with heart failure and with stage 5 chronic kidney disease, or end stage renal disease: Secondary | ICD-10-CM | POA: Insufficient documentation

## 2017-07-23 DIAGNOSIS — R404 Transient alteration of awareness: Secondary | ICD-10-CM | POA: Diagnosis not present

## 2017-07-23 DIAGNOSIS — R112 Nausea with vomiting, unspecified: Secondary | ICD-10-CM | POA: Diagnosis not present

## 2017-07-23 DIAGNOSIS — D631 Anemia in chronic kidney disease: Secondary | ICD-10-CM | POA: Diagnosis not present

## 2017-07-23 DIAGNOSIS — R110 Nausea: Secondary | ICD-10-CM | POA: Diagnosis not present

## 2017-07-23 DIAGNOSIS — R55 Syncope and collapse: Secondary | ICD-10-CM | POA: Diagnosis not present

## 2017-07-23 LAB — CBC WITH DIFFERENTIAL/PLATELET
BASOS ABS: 0 10*3/uL (ref 0.0–0.1)
BASOS PCT: 0 %
EOS ABS: 0.1 10*3/uL (ref 0.0–0.7)
EOS PCT: 2 %
HCT: 38.3 % (ref 36.0–46.0)
HEMOGLOBIN: 12 g/dL (ref 12.0–15.0)
LYMPHS ABS: 1.1 10*3/uL (ref 0.7–4.0)
Lymphocytes Relative: 36 %
MCH: 34.5 pg — AB (ref 26.0–34.0)
MCHC: 31.3 g/dL (ref 30.0–36.0)
MCV: 110.1 fL — ABNORMAL HIGH (ref 78.0–100.0)
Monocytes Absolute: 0.4 10*3/uL (ref 0.1–1.0)
Monocytes Relative: 11 %
NEUTROS PCT: 51 %
Neutro Abs: 1.6 10*3/uL — ABNORMAL LOW (ref 1.7–7.7)
PLATELETS: 129 10*3/uL — AB (ref 150–400)
RBC: 3.48 MIL/uL — AB (ref 3.87–5.11)
RDW: 14.3 % (ref 11.5–15.5)
WBC: 3.1 10*3/uL — AB (ref 4.0–10.5)

## 2017-07-23 LAB — COMPREHENSIVE METABOLIC PANEL
ALBUMIN: 3.9 g/dL (ref 3.5–5.0)
ALK PHOS: 82 U/L (ref 38–126)
ALT: 12 U/L — AB (ref 14–54)
AST: 19 U/L (ref 15–41)
Anion gap: 11 (ref 5–15)
BUN: 30 mg/dL — ABNORMAL HIGH (ref 6–20)
CALCIUM: 8.3 mg/dL — AB (ref 8.9–10.3)
CO2: 30 mmol/L (ref 22–32)
CREATININE: 5.64 mg/dL — AB (ref 0.44–1.00)
Chloride: 95 mmol/L — ABNORMAL LOW (ref 101–111)
GFR calc Af Amer: 8 mL/min — ABNORMAL LOW (ref >60)
GFR calc non Af Amer: 7 mL/min — ABNORMAL LOW (ref >60)
GLUCOSE: 75 mg/dL (ref 65–99)
Potassium: 3.9 mmol/L (ref 3.5–5.1)
SODIUM: 136 mmol/L (ref 135–145)
Total Bilirubin: 0.8 mg/dL (ref 0.3–1.2)
Total Protein: 8.9 g/dL — ABNORMAL HIGH (ref 6.5–8.1)

## 2017-07-23 LAB — TROPONIN I: Troponin I: 0.04 ng/mL (ref <0.03)

## 2017-07-23 IMAGING — CT CT HEAD W/O CM
3 series · 15 of 45 positions shown, 18 images · non-contrast
Comparison: [DATE]

CLINICAL DATA: Hypotensive during dialysis with headache.

EXAM:
CT HEAD WITHOUT CONTRAST
TECHNIQUE: Contiguous axial images were obtained from the base of the skull
through the vertex without intravenous contrast.

[Series 2: head wo · axial · 0.42mm/px · z∈[+83,+198]mm · 9 of 28 slices shown, 12 images]
[im 3/28  brain]
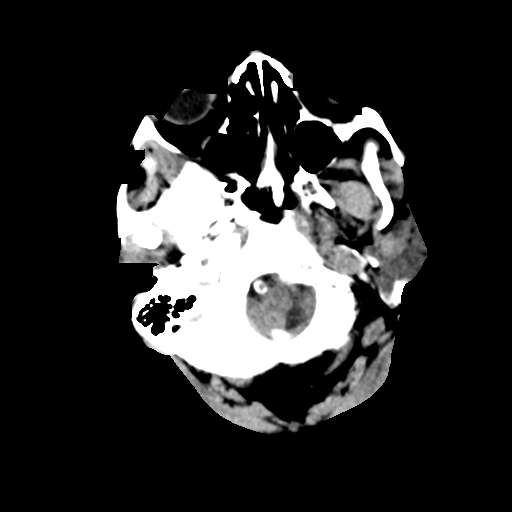
[im 3/28  bone]
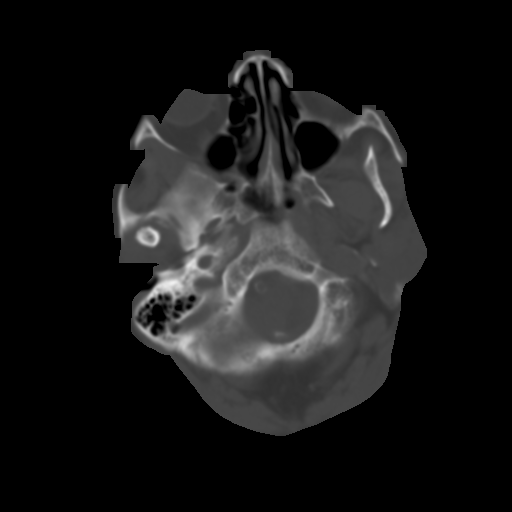
[im 6/28  brain]
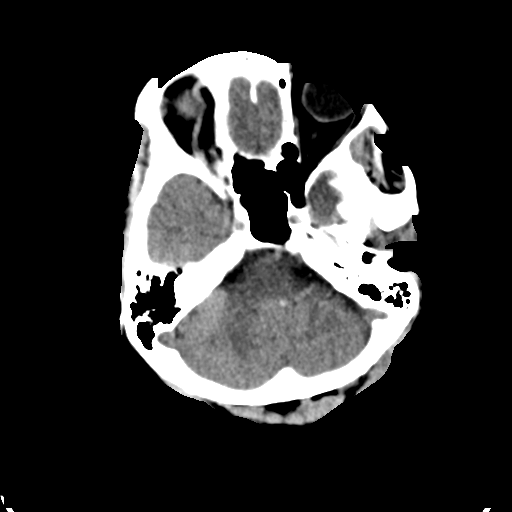
[im 9/28  brain]
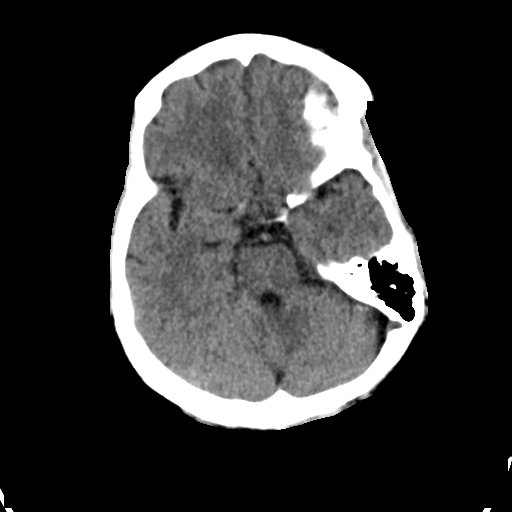
[im 12/28  brain]
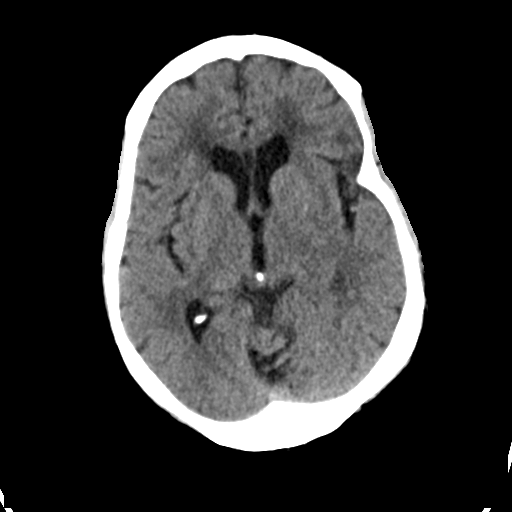
[im 15/28  brain]
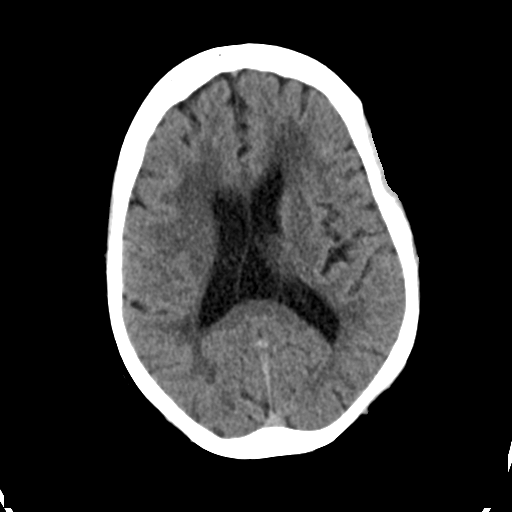
[im 15/28  bone]
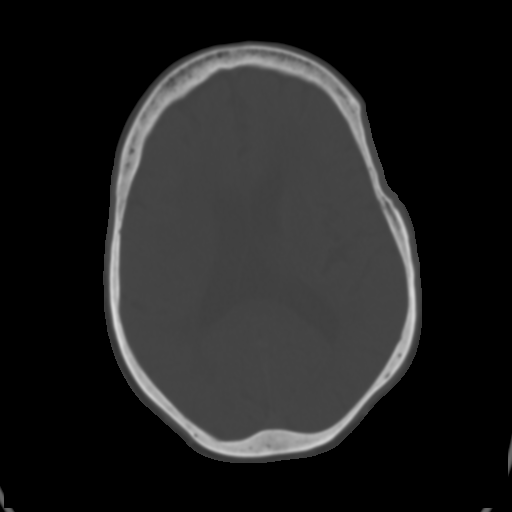
[im 17/28  brain]
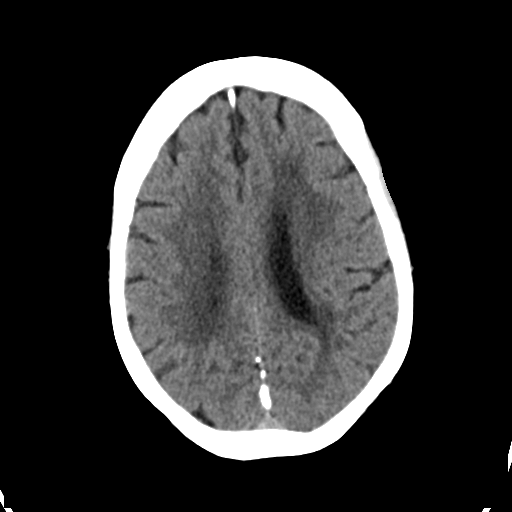
[im 20/28  brain]
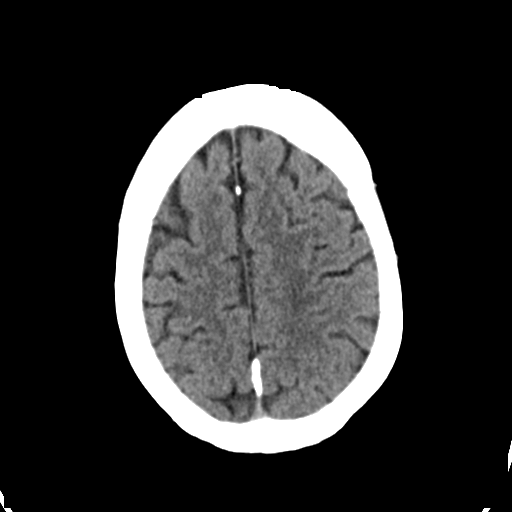
[im 23/28  brain]
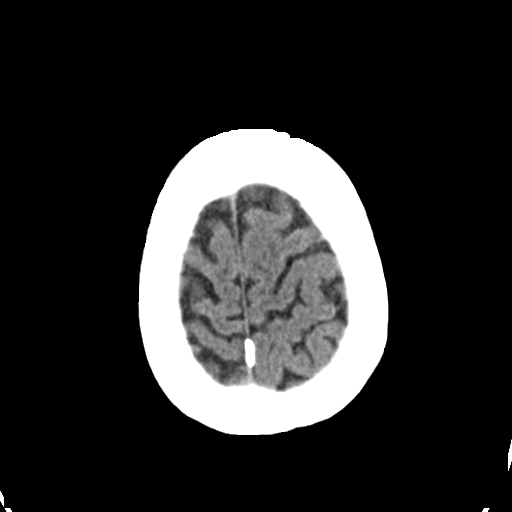
[im 26/28  brain]
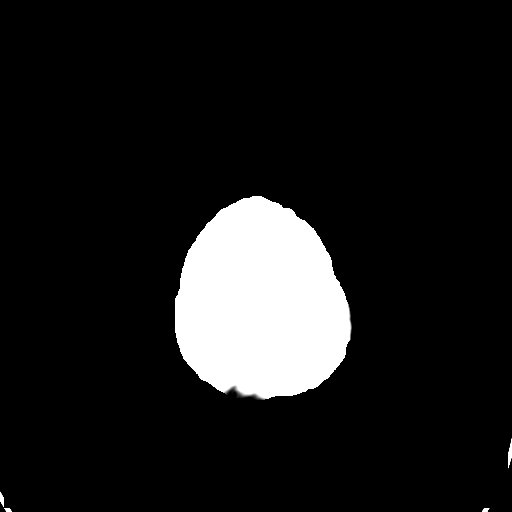
[im 26/28  bone]
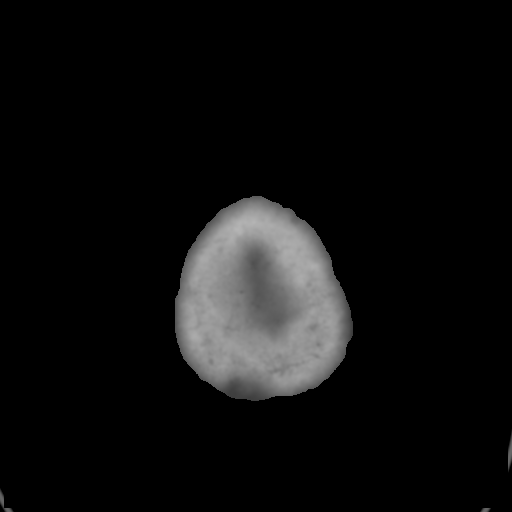

[Series 4: coronal soft tissue · coronal · 0.31mm/px · 3 of 64 slices shown]
[im 22/64  brain]
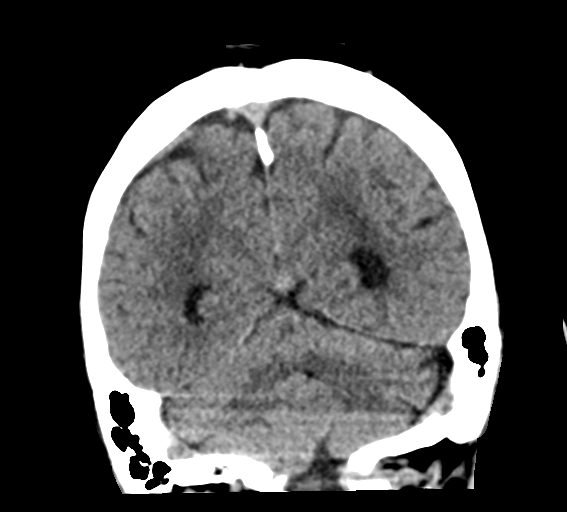
[im 29/64  brain]
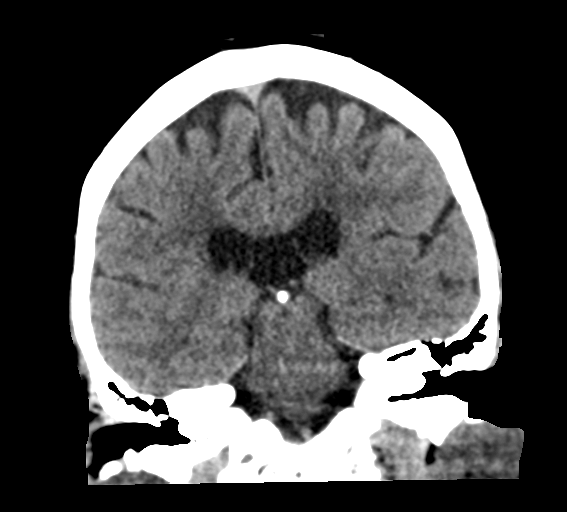
[im 36/64  brain]
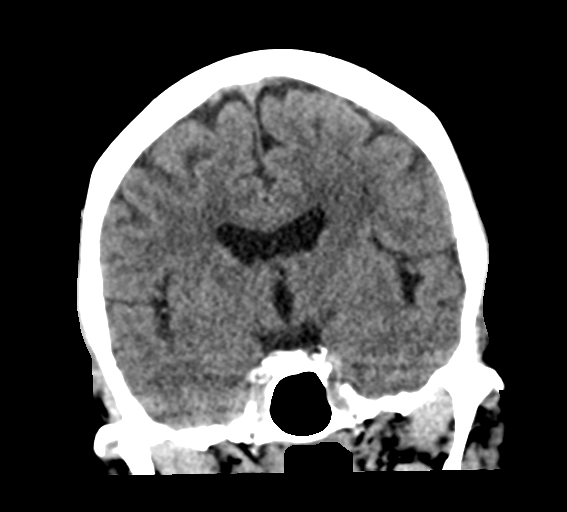

[Series 5: sagittal soft tissue · sagittal · 0.32mm/px · 3 of 51 slices shown]
[im 17/51  brain]
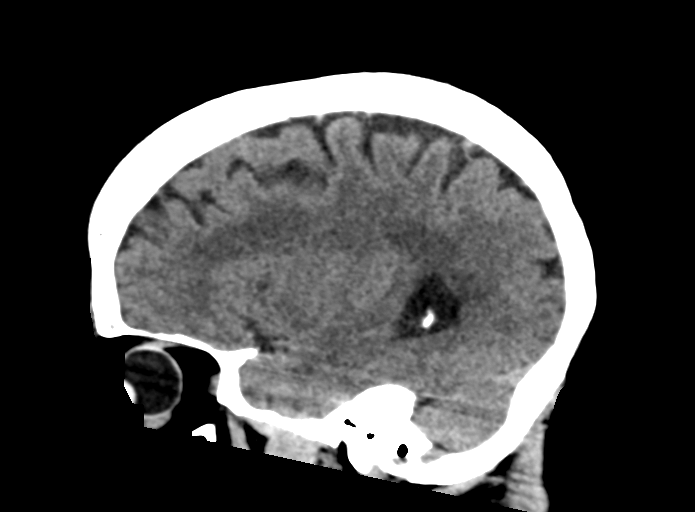
[im 26/51  brain]
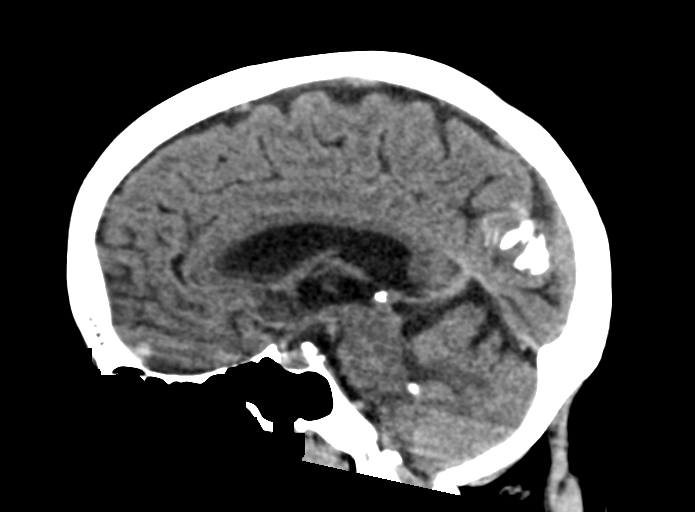
[im 34/51  brain]
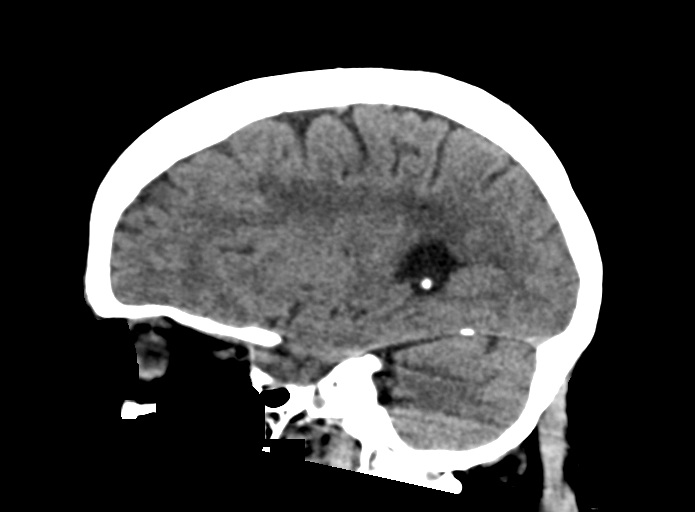

[15 of 45 positions shown; findings below may reference images not displayed]

FINDINGS: BRAIN: There is sulcal and ventricular prominence consistent with
superficial and central atrophy. No intraparenchymal hemorrhage,
mass effect nor midline shift. Periventricular and subcortical white
matter hypodensities consistent with chronic small vessel ischemic
disease are identified. No acute large vascular territory infarcts.
No abnormal extra-axial fluid collections. Basal cisterns are not
effaced and midline.

VASCULAR: Moderate calcific atherosclerosis of the carotid siphons.

SKULL: No skull fracture. No significant scalp soft tissue swelling.

SINUSES/ORBITS: The mastoid air-cells are clear. The included
paranasal sinuses are well-aerated.The included ocular globes and
orbital contents are non-suspicious.

OTHER: None.
IMPRESSION: Atrophy with chronic moderate small vessel ischemic disease. No
acute intracranial abnormality.

## 2017-07-23 IMAGING — DX DG CHEST 2V
2 series · 2 of 2 positions shown · non-contrast
Comparison: Radiographs [DATE].

CLINICAL DATA: Headache.  Hypotension.

EXAM:
CHEST - 2 VIEW

[chest lat]
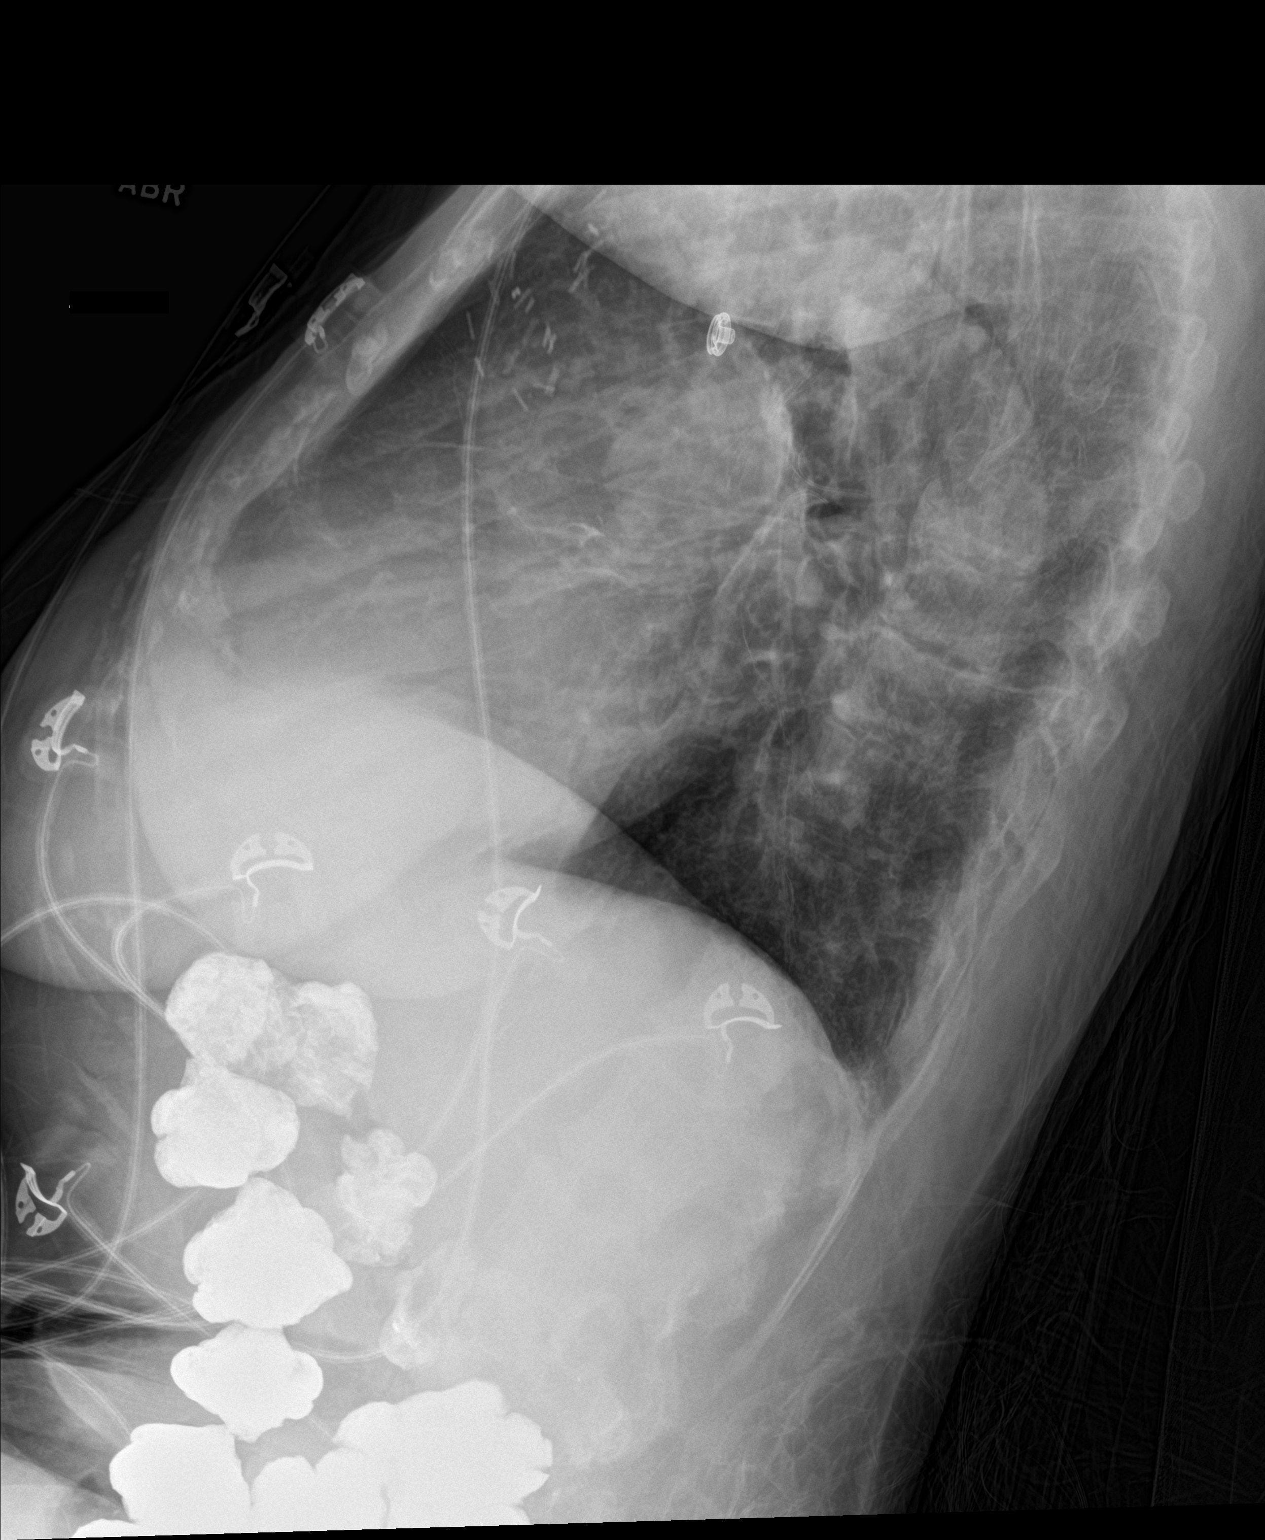

[chest ap]
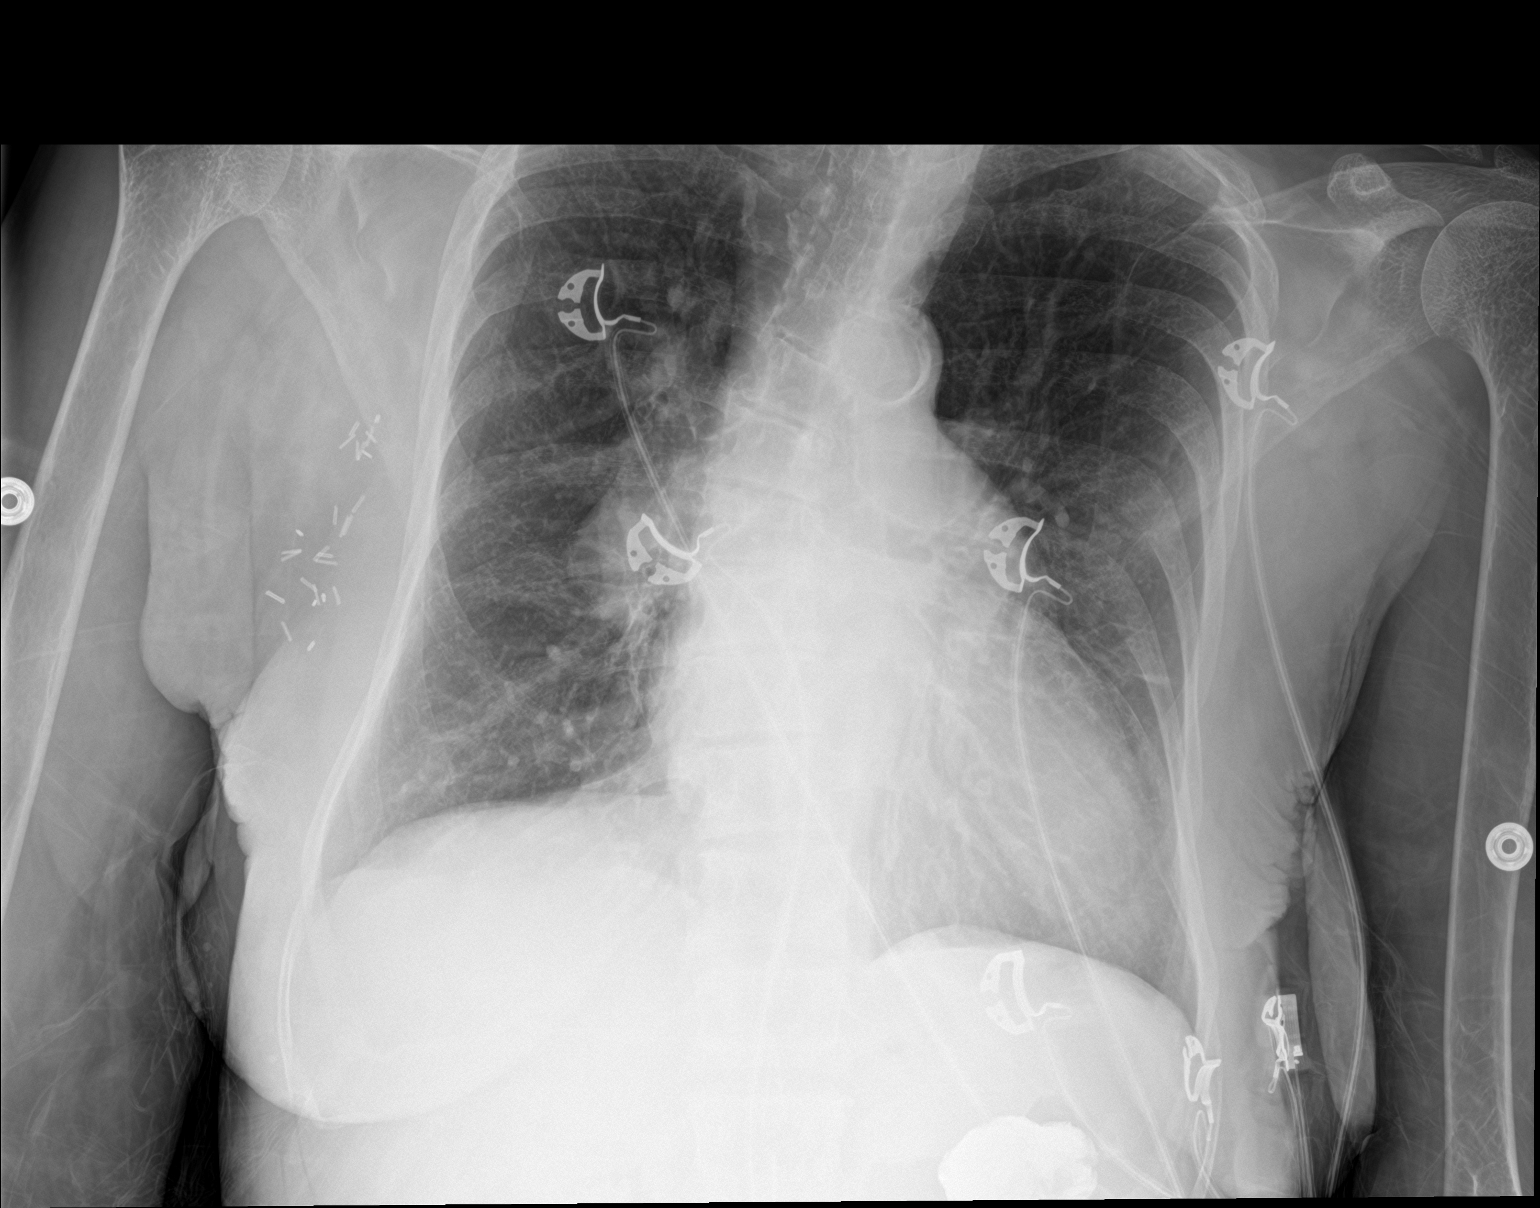

[2 of 2 positions shown; findings below may reference images not displayed]

FINDINGS: Stable cardiomegaly. Atherosclerosis of thoracic aorta is noted. No
pneumothorax or pleural effusion is noted. Right axillary surgical
clips are noted. Both lungs are clear. The visualized skeletal
structures are unremarkable.
IMPRESSION: No active cardiopulmonary disease.

## 2017-07-23 MED ORDER — ACETAMINOPHEN 325 MG PO TABS
650.0000 mg | ORAL_TABLET | Freq: Once | ORAL | Status: AC
  Administered 2017-07-23: 650 mg via ORAL
  Filled 2017-07-23: qty 2

## 2017-07-23 NOTE — ED Triage Notes (Addendum)
Pt brought by EMS from [facility] dialysis. Reported that pts BP dropped during dialysis and was given a bolus of 250 ml and BP came up . Pt began to complain of HA and when she opened her eyes they were quickly moving back and forth. Pt became nauseated and was given 2 mg of Zofran. BP en route 136/86

## 2017-07-23 NOTE — ED Provider Notes (Signed)
[facility] EMERGENCY DEPARTMENT Provider Note   CSN: [id number] Arrival date & time: 07/23/17  1537     History   Chief Complaint Chief Complaint  Patient presents with  . Headache    HPI [name] is a 67 y.o. female.  Patient asymptomatic at time of my evaluation.  Apparently had a headache and felt strange while getting dialysis.  She had a little bit of fluid so that she may have had a near syncopal episode.  No other changes.  The history is provided by the patient.  Headache   This is a new problem. The current episode started 3 to 5 hours ago. The problem occurs constantly. The problem has been resolved. Associated with: getting dialysis. Pain location: unsure. The quality of the pain is described as sharp. The pain is mild. The pain does not radiate. She has tried nothing for the symptoms.    Past Medical History:  Diagnosis Date  . Anemia   . Anxiety   . Atrial flutter ([place]) 02/22/2015  . C. difficile colitis 10/10/11  . Chronic diarrhea   . Depression   . ESRD on hemodialysis ([place])   . GERD (gastroesophageal reflux disease)   . HIV (human immunodeficiency virus infection) ([place])   . Hypertension   . Insomnia   . Intracranial hemorrhage ([place])   . Muscle weakness (generalized)   . Pulmonary HTN ([place])   . Tachycardia   . TIA (transient ischemic attack)   . Traumatic hematoma of right upper arm 02/22/2015    Patient Active Problem List   Diagnosis Date Noted  . Anemia of chronic disease 04/17/2016  . Hepatitis B immune 01/28/2016  . Atrial flutter ([place]) 02/22/2015  . Hyperkalemia 02/20/2015  . Venous stenosis of right upper extremity 04/06/2014  . ESRD on dialysis ([place]) 04/06/2014  . Essential hypertension   . 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 Nausea with vomiting 12/15/2012  . CHF, chronic ([place]) 11/25/2012  . Atrial fibrillation ([place]) 05/02/2012  . Intracranial bleed ([place]) 12/28/2011  . Angioedema of lips 09/21/2011  . Cellulitis of  breast 03/09/2011  . Erosive esophagitis 12/15/2010  . Mallory - Weiss tear 12/15/2010  . UGI bleed 12/13/2010  . DIARRHEA 04/12/2009  . Human immunodeficiency virus (HIV) disease ([place]) 11/15/2008  . Alcohol abuse 11/15/2008  . PANCREATITIS, HX OF 11/15/2008  . TOBACCO USE, QUIT 11/15/2008  . PAIN IN JOINT, UPPER ARM 08/07/2008  . ABDOMINAL WALL HERNIA 04/26/2007  . BRONCHITIS, ACUTE 11/16/2006  . HYPOKALEMIA, HX OF 07/13/2006  . DEPRESSION 03/23/2006  . Essential hypertension, malignant 03/23/2006  . GERD 03/23/2006  . PANCREATITIS 03/23/2006  . MASS, RIGHT AXILLA 03/23/2006  . HEADACHE 03/23/2006  . SYMPTOM, ENLARGEMENT, LYMPH NODES 03/23/2006  . SHINGLES, HX OF 03/23/2006  . HYSTERECTOMY, TOTAL, HX OF 03/23/2006    Past Surgical History:  Procedure Laterality Date  . A/V FISTULAGRAM N/A 04/17/2016   Procedure: A/V Fistulagram - Right Upper;  Surgeon: Waynetta Sandy, MD;  Location: [place] [facility];  Service: Cardiovascular;  Laterality: N/A;  . BIOPSY THYROID    . Dialysis Shunts     previous one removed from left arm and now present one in right arm  . DIALYSIS/PERMA CATHETER INSERTION Right 04/17/2016   Procedure: dialysis Catheter Insertion central veinous;  Surgeon: Waynetta Sandy, MD;  Location: [place] [facility];  Service: Cardiovascular;  Laterality: Right;  . ESOPHAGOGASTRODUODENOSCOPY  12/15/2010   Procedure: ESOPHAGOGASTRODUODENOSCOPY (EGD);  Surgeon: Daneil Dolin, MD;  [facility];  Service: Endoscopy;  Laterality: N/A;  . EXCISION OF BREAST BIOPSY Right 05/04/2012   Procedure: EXCISION OF BREAST BIOPSY;  Surgeon: Donato Heinz, MD;  Location: [facility] ORS;  Service: General;  Laterality: Right;  Right Excisional Breast Biopsy  . FISTULOGRAM Right 03/26/2014   Procedure: Right Arm Fistulogram with Venoplasty Right Subclavian Vein and Inominate Vein. Debridement Fistula Ulcer;  Surgeon: Conrad [place], MD;  Location: [place];  Service:  Vascular;  Laterality: Right;  . FISTULOGRAM Right 03/29/2017   Procedure: FISTULOGRAM COMPLEX RIGHT ARM with Balloon angioplasty;  Surgeon: Waynetta Sandy, MD;  Location: [place];  Service: Vascular;  Laterality: Right;  . IR GENERIC HISTORICAL  05/19/2016   IR REMOVAL TUN CV CATH W/O FL 05/19/2016 Markus [place], MD [facility]-INTER[facility] RAD  . PERIPHERAL VASCULAR BALLOON ANGIOPLASTY Right 04/17/2016   Procedure: Peripheral Vascular Balloon Angioplasty;  Surgeon: Waynetta Sandy, MD;  Location: [place] [facility];  Service: Cardiovascular;  Laterality: Right;  Central segment AV Fistula  . SHUNTOGRAM Right 10/19/2013   Procedure: FISTULOGRAM;  Surgeon: Conrad [place], MD;  Location: [facility] CATH LAB;  Service: Cardiovascular;  Laterality: Right;  . TONSILLECTOMY       OB History    Gravida  1   Para  1   Term  1   Preterm      AB      Living  1     SAB      TAB      Ectopic      Multiple      Live Births               Home Medications    Prior to Admission medications   Medication Sig Start Date End Date Taking? Authorizing Provider  albuterol (PROVENTIL HFA;VENTOLIN HFA) 108 (90 Base) MCG/ACT inhaler Inhale 2 puffs into the lungs every 4 (four) hours as needed for wheezing or shortness of breath.    [provider]  bisacodyl (DULCOLAX) 5 MG EC tablet Take 5 mg by mouth daily as needed for moderate constipation.    [provider]  buPROPion (WELLBUTRIN SR) 150 MG 12 hr tablet Take 150 mg by mouth every morning.    [provider]  cinacalcet (SENSIPAR) 30 MG tablet Take 30 mg by mouth daily.    [provider]  clopidogrel (PLAVIX) 75 MG tablet Take 1 tablet (75 mg total) by mouth daily. 03/29/17   Waynetta Sandy, MD  cyclobenzaprine (FLEXERIL) 5 MG tablet Take 5-10 mg by mouth every 12 (twelve) hours as needed for muscle spasms.     [provider]  diltiazem (CARDIZEM) 60 MG tablet Take 60 mg by mouth 2 (two) times  daily.    [provider]  dolutegravir (TIVICAY) 50 MG tablet Take 50 mg by mouth daily.    [provider]  fentaNYL (DURAGESIC - DOSED MCG/HR) 50 MCG/HR Apply one patch every 72 hours for pain. Remove old patch. External use only. Rotate sites. 02/28/15   Lauree Chandler, NP  lamivudine (EPIVIR) 100 MG tablet Take 50 mg by mouth daily.    [provider]  lidocaine-prilocaine (EMLA) cream Apply 1 application topically 3 (three) times a week. Apply 1 hour prior to dialysis access site on Monday Wednesday and Friday    [provider]  Melatonin 3 MG TABS Take 3 mg by mouth at bedtime.    [provider]  metoprolol (LOPRESSOR) 50  MG tablet Take 50 mg by mouth 2 (two) times daily.    [provider]  multivitamin (RENA-VIT) TABS tablet Take 1 tablet by mouth at bedtime.    [provider]  oxyCODONE (OXY IR/ROXICODONE) 5 MG immediate release tablet Take one tablet by mouth every 6 hours as needed for moderate pain Patient taking differently: Take 10 mg by mouth every 12 (twelve) hours as needed for moderate pain.  02/26/15   Lauree Chandler, NP  pantoprazole (PROTONIX) 40 MG tablet Take 40 mg by mouth daily.    [provider]  promethazine (PHENERGAN) 12.5 MG tablet Take 12.5 mg by mouth every Monday, Wednesday, and Friday with hemodialysis.     [provider]  promethazine (PHENERGAN) 12.5 MG tablet Take 12.5 mg by mouth every 6 (six) hours as needed for nausea or vomiting.    [provider]  sertraline (ZOLOFT) 100 MG tablet Take 150 mg by mouth daily.     [provider]  sevelamer carbonate (RENVELA) 800 MG tablet Take 2,400 mg by mouth 3 (three) times daily with meals.     [provider]  sildenafil (REVATIO) 20 MG tablet Take 20 mg by mouth 3 (three) times daily.     [provider]  vitamin C (ASCORBIC ACID) 500 MG tablet Take 500 mg by mouth 2 (two) times daily.     [provider]  zaleplon (SONATA) 5 MG capsule Take one capsule by mouth every night at bedtime for rest Patient taking differently: Take 10 mg by mouth at bedtime.  02/19/15   Lauree Chandler, NP  zidovudine (RETROVIR) 100 MG capsule Take 100 mg by mouth 3 (three) times daily.      [provider]    Family History Family History  Problem Relation Age of Onset  . Anesthesia problems Neg Hx   . Hypotension Neg Hx   . Malignant hyperthermia Neg Hx   . Pseudochol deficiency Neg Hx     Social History Social History   Tobacco Use  . Smoking status: Former Smoker    Last attempt to quit: 03/12/1983    Years since quitting: 34.3  . Smokeless tobacco: Never Used  Substance Use Topics  . Alcohol use: No    Alcohol/week: 0.0 oz  . Drug use: No     Allergies   Lactose intolerance (gi); Latex; and Penicillins   Review of Systems Review of Systems  Neurological: Positive for headaches.  All other systems reviewed and are negative.    Physical Exam Updated Vital Signs BP 111/67   Pulse (!) 59   Temp 97.8 F (36.6 C) (Oral)   Resp 13   Wt 47 kg (103 lb 9.9 oz)   SpO2 95%   BMI 20.93 kg/m   Physical Exam  Constitutional: She appears well-developed and well-nourished.  HENT:  Head: Normocephalic and atraumatic.  Eyes: Pupils are equal, round, and reactive to light. EOM are normal. Right eye exhibits normal extraocular motion. Left eye exhibits normal extraocular motion.  Neck: Normal range of motion.  Cardiovascular: Normal rate and regular rhythm.  Pulmonary/Chest: Effort normal and breath sounds normal. No stridor. No respiratory distress.  Abdominal: Soft. She exhibits no distension.  Musculoskeletal: Normal range of motion. She exhibits no edema or deformity.  Neurological: She is alert.  Skin: Skin is warm and dry.  Nursing note and vitals reviewed.    ED Treatments / Results  Labs (all labs ordered are listed, but only abnormal  results  are displayed) Labs Reviewed  CBC WITH DIFFERENTIAL/PLATELET - Abnormal; Notable for the following components:      Result Value   WBC 3.1 (*)    RBC 3.48 (*)    MCV 110.1 (*)    MCH 34.5 (*)    Platelets 129 (*)    Neutro Abs 1.6 (*)    All other components within normal limits  COMPREHENSIVE METABOLIC PANEL - Abnormal; Notable for the following components:   Chloride 95 (*)    BUN 30 (*)    Creatinine, Ser 5.64 (*)    Calcium 8.3 (*)    Total Protein 8.9 (*)    ALT 12 (*)    GFR calc non Af Amer 7 (*)    GFR calc Af Amer 8 (*)    All other components within normal limits  TROPONIN I - Abnormal; Notable for the following components:   Troponin I 0.04 (*)    All other components within normal limits  CBG MONITORING, ED    EKG EKG Interpretation  Date/Time:  Friday Jul 23 2017 16:55:19 EDT Ventricular Rate:  61 PR Interval:    QRS Duration: 98 QT Interval:  573 QTC Calculation: 578 R Axis:   -20 Text Interpretation:  Sinus rhythm Borderline left axis deviation Nonspecific T abnrm, anterolateral leads Prolonged QT interval No significant change since last tracing Confirmed by Merrily Pew [phone]) on 07/23/2017 6:04:53 PM   Radiology Dg Chest 2 View  Result Date: 07/23/2017 CLINICAL DATA:  Headache.  Hypotension. EXAM: CHEST - 2 VIEW COMPARISON:  Radiographs of February 20, 2015. FINDINGS: Stable cardiomegaly. Atherosclerosis of thoracic aorta is noted. No pneumothorax or pleural effusion is noted. Right axillary surgical clips are noted. Both lungs are clear. The visualized skeletal structures are unremarkable. IMPRESSION: No active cardiopulmonary disease. Electronically Signed   By: Marijo Conception, M.D.   On: 07/23/2017 17:24   Ct Head Wo Contrast  Result Date: 07/23/2017 CLINICAL DATA:  Hypotensive during dialysis with headache. EXAM: CT HEAD WITHOUT CONTRAST TECHNIQUE: Contiguous axial images were obtained from the base of the skull through the vertex without  intravenous contrast. COMPARISON:  12/29/2011 FINDINGS: BRAIN: There is sulcal and ventricular prominence consistent with superficial and central atrophy. No intraparenchymal hemorrhage, mass effect nor midline shift. Periventricular and subcortical white matter hypodensities consistent with chronic small vessel ischemic disease are identified. No acute large vascular territory infarcts. No abnormal extra-axial fluid collections. Basal cisterns are not effaced and midline. VASCULAR: Moderate calcific atherosclerosis of the carotid siphons. SKULL: No skull fracture. No significant scalp soft tissue swelling. SINUSES/ORBITS: The mastoid air-cells are clear. The included paranasal sinuses are well-aerated.The included ocular globes and orbital contents are non-suspicious. OTHER: None. IMPRESSION: Atrophy with chronic moderate small vessel ischemic disease. No acute intracranial abnormality. Electronically Signed   By: Ashley Royalty M.D.   On: 07/23/2017 17:29   Dg Ugi W/high Density W/kub  Result Date: 07/22/2017 CLINICAL DATA:  Nausea and vomiting after eating food for 6 months occasionally with liquids EXAM: UPPER GI SERIES WITH KUB TECHNIQUE: After obtaining a scout radiograph a routine upper GI series was performed using thin and high density barium. FLUOROSCOPY TIME:  Fluoroscopy Time:  1 minutes 36 seconds Radiation Exposure Index (if provided by the fluoroscopic device): 33.4 mGy Number of Acquired Spot Images: 3+ spots COMPARISON:  None FINDINGS: Nonobstructive bowel gas pattern on scout image. Minimally prominent stool in colon. Heart enlarged. Lung bases clear. Bones demineralized. Normal esophageal distention without mass  or stricture. Age related mild esophageal dysmotility. Mild gastroesophageal reflux identified into the distal third of the esophagus during the exam. Stomach distends normally with thickening of rugal folds particularly at the fundus and proximal body question gastritis though  infiltrative processes can cause a similar appearance. No definite mass or ulceration. No gastric outlet obstruction. Duodenal bulb and sweep normal appearance. Ligament of Treitz normal position. Jejunal loops unremarkable. IMPRESSION: Age-related esophageal dysmotility. Mild gastroesophageal reflux. Thickening of rugal folds at the gastric fundus and proximal gastric body, can be seen with gastritis and with infiltrative processes including tumor. Correlation with upper endoscopy recommended. Electronically Signed   By: Lavonia Dana M.D.   On: 07/22/2017 11:59    Procedures Procedures (including critical care time)  Medications Ordered in ED Medications  acetaminophen (TYLENOL) tablet 650 mg (650 mg Oral Given 07/23/17 1905)     Initial Impression / Assessment and Plan / ED Course  I have reviewed the triage vital signs and the nursing notes.  Pertinent labs & imaging results that were available during my care of the patient were reviewed by me and considered in my medical decision making (see chart for details).     Symptoms resolved. Workup negative. Monitored for several hours without any changes, normal VS. Unclear etiology of symptoms but appears to be nontoxic and stable for discharge back to facility at this time.   Final Clinical Impressions(s) / ED Diagnoses   Final diagnoses:  Acute nonintractable headache, unspecified headache type    ED Discharge Orders    None       Dallis Czaja, Corene Cornea, MD 07/23/17 2143

## 2017-07-26 DIAGNOSIS — E611 Iron deficiency: Secondary | ICD-10-CM | POA: Diagnosis not present

## 2017-07-26 DIAGNOSIS — R112 Nausea with vomiting, unspecified: Secondary | ICD-10-CM | POA: Diagnosis not present

## 2017-07-26 DIAGNOSIS — N2581 Secondary hyperparathyroidism of renal origin: Secondary | ICD-10-CM | POA: Diagnosis not present

## 2017-07-26 DIAGNOSIS — D631 Anemia in chronic kidney disease: Secondary | ICD-10-CM | POA: Diagnosis not present

## 2017-07-26 DIAGNOSIS — D472 Monoclonal gammopathy: Secondary | ICD-10-CM | POA: Diagnosis not present

## 2017-07-26 DIAGNOSIS — N186 End stage renal disease: Secondary | ICD-10-CM | POA: Diagnosis not present

## 2017-07-27 DIAGNOSIS — K210 Gastro-esophageal reflux disease with esophagitis: Secondary | ICD-10-CM | POA: Diagnosis not present

## 2017-07-27 DIAGNOSIS — R112 Nausea with vomiting, unspecified: Secondary | ICD-10-CM | POA: Diagnosis not present

## 2017-07-27 DIAGNOSIS — N186 End stage renal disease: Secondary | ICD-10-CM | POA: Diagnosis not present

## 2017-07-27 DIAGNOSIS — R748 Abnormal levels of other serum enzymes: Secondary | ICD-10-CM | POA: Diagnosis not present

## 2017-07-27 LAB — CBG MONITORING, ED: Glucose-Capillary: 75 mg/dL (ref 65–99)

## 2017-07-30 DIAGNOSIS — N186 End stage renal disease: Secondary | ICD-10-CM | POA: Diagnosis not present

## 2017-07-30 DIAGNOSIS — E611 Iron deficiency: Secondary | ICD-10-CM | POA: Diagnosis not present

## 2017-07-30 DIAGNOSIS — D631 Anemia in chronic kidney disease: Secondary | ICD-10-CM | POA: Diagnosis not present

## 2017-07-30 DIAGNOSIS — F5101 Primary insomnia: Secondary | ICD-10-CM | POA: Diagnosis not present

## 2017-07-30 DIAGNOSIS — R112 Nausea with vomiting, unspecified: Secondary | ICD-10-CM | POA: Diagnosis not present

## 2017-07-30 DIAGNOSIS — F331 Major depressive disorder, recurrent, moderate: Secondary | ICD-10-CM | POA: Diagnosis not present

## 2017-07-30 DIAGNOSIS — F411 Generalized anxiety disorder: Secondary | ICD-10-CM | POA: Diagnosis not present

## 2017-07-30 DIAGNOSIS — D472 Monoclonal gammopathy: Secondary | ICD-10-CM | POA: Diagnosis not present

## 2017-07-30 DIAGNOSIS — N2581 Secondary hyperparathyroidism of renal origin: Secondary | ICD-10-CM | POA: Diagnosis not present

## 2017-07-30 DIAGNOSIS — N184 Chronic kidney disease, stage 4 (severe): Secondary | ICD-10-CM | POA: Diagnosis not present

## 2017-07-30 DIAGNOSIS — Z992 Dependence on renal dialysis: Secondary | ICD-10-CM | POA: Diagnosis not present

## 2017-07-31 DIAGNOSIS — D472 Monoclonal gammopathy: Secondary | ICD-10-CM | POA: Diagnosis not present

## 2017-07-31 DIAGNOSIS — Z992 Dependence on renal dialysis: Secondary | ICD-10-CM | POA: Diagnosis not present

## 2017-07-31 DIAGNOSIS — R112 Nausea with vomiting, unspecified: Secondary | ICD-10-CM | POA: Diagnosis not present

## 2017-07-31 DIAGNOSIS — N186 End stage renal disease: Secondary | ICD-10-CM | POA: Diagnosis not present

## 2017-07-31 DIAGNOSIS — D631 Anemia in chronic kidney disease: Secondary | ICD-10-CM | POA: Diagnosis not present

## 2017-07-31 DIAGNOSIS — N2581 Secondary hyperparathyroidism of renal origin: Secondary | ICD-10-CM | POA: Diagnosis not present

## 2017-07-31 DIAGNOSIS — E611 Iron deficiency: Secondary | ICD-10-CM | POA: Diagnosis not present

## 2017-08-03 DIAGNOSIS — R748 Abnormal levels of other serum enzymes: Secondary | ICD-10-CM | POA: Diagnosis not present

## 2017-08-03 DIAGNOSIS — N186 End stage renal disease: Secondary | ICD-10-CM | POA: Diagnosis not present

## 2017-08-03 DIAGNOSIS — K210 Gastro-esophageal reflux disease with esophagitis: Secondary | ICD-10-CM | POA: Diagnosis not present

## 2017-08-03 DIAGNOSIS — R112 Nausea with vomiting, unspecified: Secondary | ICD-10-CM | POA: Diagnosis not present

## 2017-08-04 DIAGNOSIS — N2581 Secondary hyperparathyroidism of renal origin: Secondary | ICD-10-CM | POA: Diagnosis not present

## 2017-08-04 DIAGNOSIS — E611 Iron deficiency: Secondary | ICD-10-CM | POA: Diagnosis not present

## 2017-08-04 DIAGNOSIS — D631 Anemia in chronic kidney disease: Secondary | ICD-10-CM | POA: Diagnosis not present

## 2017-08-04 DIAGNOSIS — D472 Monoclonal gammopathy: Secondary | ICD-10-CM | POA: Diagnosis not present

## 2017-08-04 DIAGNOSIS — R112 Nausea with vomiting, unspecified: Secondary | ICD-10-CM | POA: Diagnosis not present

## 2017-08-04 DIAGNOSIS — N186 End stage renal disease: Secondary | ICD-10-CM | POA: Diagnosis not present

## 2017-08-06 DIAGNOSIS — N186 End stage renal disease: Secondary | ICD-10-CM | POA: Diagnosis not present

## 2017-08-06 DIAGNOSIS — D472 Monoclonal gammopathy: Secondary | ICD-10-CM | POA: Diagnosis not present

## 2017-08-06 DIAGNOSIS — E611 Iron deficiency: Secondary | ICD-10-CM | POA: Diagnosis not present

## 2017-08-06 DIAGNOSIS — R112 Nausea with vomiting, unspecified: Secondary | ICD-10-CM | POA: Diagnosis not present

## 2017-08-06 DIAGNOSIS — N2581 Secondary hyperparathyroidism of renal origin: Secondary | ICD-10-CM | POA: Diagnosis not present

## 2017-08-06 DIAGNOSIS — D631 Anemia in chronic kidney disease: Secondary | ICD-10-CM | POA: Diagnosis not present

## 2017-08-09 DIAGNOSIS — E611 Iron deficiency: Secondary | ICD-10-CM | POA: Diagnosis not present

## 2017-08-09 DIAGNOSIS — D631 Anemia in chronic kidney disease: Secondary | ICD-10-CM | POA: Diagnosis not present

## 2017-08-09 DIAGNOSIS — N186 End stage renal disease: Secondary | ICD-10-CM | POA: Diagnosis not present

## 2017-08-09 DIAGNOSIS — N2581 Secondary hyperparathyroidism of renal origin: Secondary | ICD-10-CM | POA: Diagnosis not present

## 2017-08-09 DIAGNOSIS — D472 Monoclonal gammopathy: Secondary | ICD-10-CM | POA: Diagnosis not present

## 2017-08-09 DIAGNOSIS — R112 Nausea with vomiting, unspecified: Secondary | ICD-10-CM | POA: Diagnosis not present

## 2017-08-10 DIAGNOSIS — D649 Anemia, unspecified: Secondary | ICD-10-CM | POA: Diagnosis not present

## 2017-08-11 DIAGNOSIS — N2581 Secondary hyperparathyroidism of renal origin: Secondary | ICD-10-CM | POA: Diagnosis not present

## 2017-08-11 DIAGNOSIS — E611 Iron deficiency: Secondary | ICD-10-CM | POA: Diagnosis not present

## 2017-08-11 DIAGNOSIS — D472 Monoclonal gammopathy: Secondary | ICD-10-CM | POA: Diagnosis not present

## 2017-08-11 DIAGNOSIS — R112 Nausea with vomiting, unspecified: Secondary | ICD-10-CM | POA: Diagnosis not present

## 2017-08-11 DIAGNOSIS — D631 Anemia in chronic kidney disease: Secondary | ICD-10-CM | POA: Diagnosis not present

## 2017-08-11 DIAGNOSIS — N186 End stage renal disease: Secondary | ICD-10-CM | POA: Diagnosis not present

## 2017-08-13 DIAGNOSIS — R112 Nausea with vomiting, unspecified: Secondary | ICD-10-CM | POA: Diagnosis not present

## 2017-08-13 DIAGNOSIS — N186 End stage renal disease: Secondary | ICD-10-CM | POA: Diagnosis not present

## 2017-08-13 DIAGNOSIS — E611 Iron deficiency: Secondary | ICD-10-CM | POA: Diagnosis not present

## 2017-08-13 DIAGNOSIS — D472 Monoclonal gammopathy: Secondary | ICD-10-CM | POA: Diagnosis not present

## 2017-08-13 DIAGNOSIS — D631 Anemia in chronic kidney disease: Secondary | ICD-10-CM | POA: Diagnosis not present

## 2017-08-13 DIAGNOSIS — N2581 Secondary hyperparathyroidism of renal origin: Secondary | ICD-10-CM | POA: Diagnosis not present

## 2017-08-16 DIAGNOSIS — E611 Iron deficiency: Secondary | ICD-10-CM | POA: Diagnosis not present

## 2017-08-16 DIAGNOSIS — N186 End stage renal disease: Secondary | ICD-10-CM | POA: Diagnosis not present

## 2017-08-16 DIAGNOSIS — R112 Nausea with vomiting, unspecified: Secondary | ICD-10-CM | POA: Diagnosis not present

## 2017-08-16 DIAGNOSIS — D472 Monoclonal gammopathy: Secondary | ICD-10-CM | POA: Diagnosis not present

## 2017-08-16 DIAGNOSIS — N2581 Secondary hyperparathyroidism of renal origin: Secondary | ICD-10-CM | POA: Diagnosis not present

## 2017-08-16 DIAGNOSIS — D631 Anemia in chronic kidney disease: Secondary | ICD-10-CM | POA: Diagnosis not present

## 2017-08-18 DIAGNOSIS — N186 End stage renal disease: Secondary | ICD-10-CM | POA: Diagnosis not present

## 2017-08-18 DIAGNOSIS — D472 Monoclonal gammopathy: Secondary | ICD-10-CM | POA: Diagnosis not present

## 2017-08-18 DIAGNOSIS — N2581 Secondary hyperparathyroidism of renal origin: Secondary | ICD-10-CM | POA: Diagnosis not present

## 2017-08-18 DIAGNOSIS — D631 Anemia in chronic kidney disease: Secondary | ICD-10-CM | POA: Diagnosis not present

## 2017-08-18 DIAGNOSIS — R112 Nausea with vomiting, unspecified: Secondary | ICD-10-CM | POA: Diagnosis not present

## 2017-08-18 DIAGNOSIS — E611 Iron deficiency: Secondary | ICD-10-CM | POA: Diagnosis not present

## 2017-08-20 DIAGNOSIS — R112 Nausea with vomiting, unspecified: Secondary | ICD-10-CM | POA: Diagnosis not present

## 2017-08-20 DIAGNOSIS — D472 Monoclonal gammopathy: Secondary | ICD-10-CM | POA: Diagnosis not present

## 2017-08-20 DIAGNOSIS — D631 Anemia in chronic kidney disease: Secondary | ICD-10-CM | POA: Diagnosis not present

## 2017-08-20 DIAGNOSIS — N2581 Secondary hyperparathyroidism of renal origin: Secondary | ICD-10-CM | POA: Diagnosis not present

## 2017-08-20 DIAGNOSIS — N186 End stage renal disease: Secondary | ICD-10-CM | POA: Diagnosis not present

## 2017-08-20 DIAGNOSIS — E611 Iron deficiency: Secondary | ICD-10-CM | POA: Diagnosis not present

## 2017-08-23 DIAGNOSIS — N2581 Secondary hyperparathyroidism of renal origin: Secondary | ICD-10-CM | POA: Diagnosis not present

## 2017-08-23 DIAGNOSIS — R112 Nausea with vomiting, unspecified: Secondary | ICD-10-CM | POA: Diagnosis not present

## 2017-08-23 DIAGNOSIS — N186 End stage renal disease: Secondary | ICD-10-CM | POA: Diagnosis not present

## 2017-08-23 DIAGNOSIS — D631 Anemia in chronic kidney disease: Secondary | ICD-10-CM | POA: Diagnosis not present

## 2017-08-23 DIAGNOSIS — D472 Monoclonal gammopathy: Secondary | ICD-10-CM | POA: Diagnosis not present

## 2017-08-23 DIAGNOSIS — E611 Iron deficiency: Secondary | ICD-10-CM | POA: Diagnosis not present

## 2017-08-25 DIAGNOSIS — N2581 Secondary hyperparathyroidism of renal origin: Secondary | ICD-10-CM | POA: Diagnosis not present

## 2017-08-25 DIAGNOSIS — D631 Anemia in chronic kidney disease: Secondary | ICD-10-CM | POA: Diagnosis not present

## 2017-08-25 DIAGNOSIS — N186 End stage renal disease: Secondary | ICD-10-CM | POA: Diagnosis not present

## 2017-08-25 DIAGNOSIS — E611 Iron deficiency: Secondary | ICD-10-CM | POA: Diagnosis not present

## 2017-08-25 DIAGNOSIS — D472 Monoclonal gammopathy: Secondary | ICD-10-CM | POA: Diagnosis not present

## 2017-08-25 DIAGNOSIS — R112 Nausea with vomiting, unspecified: Secondary | ICD-10-CM | POA: Diagnosis not present

## 2017-08-27 DIAGNOSIS — N2581 Secondary hyperparathyroidism of renal origin: Secondary | ICD-10-CM | POA: Diagnosis not present

## 2017-08-27 DIAGNOSIS — N186 End stage renal disease: Secondary | ICD-10-CM | POA: Diagnosis not present

## 2017-08-27 DIAGNOSIS — E611 Iron deficiency: Secondary | ICD-10-CM | POA: Diagnosis not present

## 2017-08-27 DIAGNOSIS — D631 Anemia in chronic kidney disease: Secondary | ICD-10-CM | POA: Diagnosis not present

## 2017-08-27 DIAGNOSIS — D472 Monoclonal gammopathy: Secondary | ICD-10-CM | POA: Diagnosis not present

## 2017-08-27 DIAGNOSIS — R112 Nausea with vomiting, unspecified: Secondary | ICD-10-CM | POA: Diagnosis not present

## 2017-08-29 DIAGNOSIS — N186 End stage renal disease: Secondary | ICD-10-CM | POA: Diagnosis not present

## 2017-08-29 DIAGNOSIS — Z992 Dependence on renal dialysis: Secondary | ICD-10-CM | POA: Diagnosis not present

## 2017-08-30 DIAGNOSIS — D631 Anemia in chronic kidney disease: Secondary | ICD-10-CM | POA: Diagnosis not present

## 2017-08-30 DIAGNOSIS — Z992 Dependence on renal dialysis: Secondary | ICD-10-CM | POA: Diagnosis not present

## 2017-08-30 DIAGNOSIS — N186 End stage renal disease: Secondary | ICD-10-CM | POA: Diagnosis not present

## 2017-08-30 DIAGNOSIS — D472 Monoclonal gammopathy: Secondary | ICD-10-CM | POA: Diagnosis not present

## 2017-08-30 DIAGNOSIS — N2581 Secondary hyperparathyroidism of renal origin: Secondary | ICD-10-CM | POA: Diagnosis not present

## 2017-08-30 DIAGNOSIS — D509 Iron deficiency anemia, unspecified: Secondary | ICD-10-CM | POA: Diagnosis not present

## 2017-09-01 DIAGNOSIS — Z992 Dependence on renal dialysis: Secondary | ICD-10-CM | POA: Diagnosis not present

## 2017-09-01 DIAGNOSIS — N186 End stage renal disease: Secondary | ICD-10-CM | POA: Diagnosis not present

## 2017-09-01 DIAGNOSIS — N2581 Secondary hyperparathyroidism of renal origin: Secondary | ICD-10-CM | POA: Diagnosis not present

## 2017-09-01 DIAGNOSIS — D631 Anemia in chronic kidney disease: Secondary | ICD-10-CM | POA: Diagnosis not present

## 2017-09-01 DIAGNOSIS — D509 Iron deficiency anemia, unspecified: Secondary | ICD-10-CM | POA: Diagnosis not present

## 2017-09-01 DIAGNOSIS — D472 Monoclonal gammopathy: Secondary | ICD-10-CM | POA: Diagnosis not present

## 2017-09-03 DIAGNOSIS — D472 Monoclonal gammopathy: Secondary | ICD-10-CM | POA: Diagnosis not present

## 2017-09-03 DIAGNOSIS — N2581 Secondary hyperparathyroidism of renal origin: Secondary | ICD-10-CM | POA: Diagnosis not present

## 2017-09-03 DIAGNOSIS — Z992 Dependence on renal dialysis: Secondary | ICD-10-CM | POA: Diagnosis not present

## 2017-09-03 DIAGNOSIS — N186 End stage renal disease: Secondary | ICD-10-CM | POA: Diagnosis not present

## 2017-09-03 DIAGNOSIS — D631 Anemia in chronic kidney disease: Secondary | ICD-10-CM | POA: Diagnosis not present

## 2017-09-03 DIAGNOSIS — D509 Iron deficiency anemia, unspecified: Secondary | ICD-10-CM | POA: Diagnosis not present

## 2017-09-06 DIAGNOSIS — D631 Anemia in chronic kidney disease: Secondary | ICD-10-CM | POA: Diagnosis not present

## 2017-09-06 DIAGNOSIS — Z992 Dependence on renal dialysis: Secondary | ICD-10-CM | POA: Diagnosis not present

## 2017-09-06 DIAGNOSIS — D472 Monoclonal gammopathy: Secondary | ICD-10-CM | POA: Diagnosis not present

## 2017-09-06 DIAGNOSIS — N186 End stage renal disease: Secondary | ICD-10-CM | POA: Diagnosis not present

## 2017-09-06 DIAGNOSIS — N2581 Secondary hyperparathyroidism of renal origin: Secondary | ICD-10-CM | POA: Diagnosis not present

## 2017-09-06 DIAGNOSIS — D509 Iron deficiency anemia, unspecified: Secondary | ICD-10-CM | POA: Diagnosis not present

## 2017-09-07 DIAGNOSIS — N186 End stage renal disease: Secondary | ICD-10-CM | POA: Diagnosis not present

## 2017-09-07 DIAGNOSIS — I2729 Other secondary pulmonary hypertension: Secondary | ICD-10-CM | POA: Diagnosis not present

## 2017-09-07 DIAGNOSIS — F419 Anxiety disorder, unspecified: Secondary | ICD-10-CM | POA: Diagnosis not present

## 2017-09-07 DIAGNOSIS — F339 Major depressive disorder, recurrent, unspecified: Secondary | ICD-10-CM | POA: Diagnosis not present

## 2017-09-08 DIAGNOSIS — N2581 Secondary hyperparathyroidism of renal origin: Secondary | ICD-10-CM | POA: Diagnosis not present

## 2017-09-08 DIAGNOSIS — N186 End stage renal disease: Secondary | ICD-10-CM | POA: Diagnosis not present

## 2017-09-08 DIAGNOSIS — D631 Anemia in chronic kidney disease: Secondary | ICD-10-CM | POA: Diagnosis not present

## 2017-09-08 DIAGNOSIS — Z992 Dependence on renal dialysis: Secondary | ICD-10-CM | POA: Diagnosis not present

## 2017-09-08 DIAGNOSIS — D509 Iron deficiency anemia, unspecified: Secondary | ICD-10-CM | POA: Diagnosis not present

## 2017-09-08 DIAGNOSIS — D472 Monoclonal gammopathy: Secondary | ICD-10-CM | POA: Diagnosis not present

## 2017-09-10 DIAGNOSIS — F5101 Primary insomnia: Secondary | ICD-10-CM | POA: Diagnosis not present

## 2017-09-10 DIAGNOSIS — N186 End stage renal disease: Secondary | ICD-10-CM | POA: Diagnosis not present

## 2017-09-10 DIAGNOSIS — D631 Anemia in chronic kidney disease: Secondary | ICD-10-CM | POA: Diagnosis not present

## 2017-09-10 DIAGNOSIS — N2581 Secondary hyperparathyroidism of renal origin: Secondary | ICD-10-CM | POA: Diagnosis not present

## 2017-09-10 DIAGNOSIS — Z992 Dependence on renal dialysis: Secondary | ICD-10-CM | POA: Diagnosis not present

## 2017-09-10 DIAGNOSIS — F411 Generalized anxiety disorder: Secondary | ICD-10-CM | POA: Diagnosis not present

## 2017-09-10 DIAGNOSIS — D509 Iron deficiency anemia, unspecified: Secondary | ICD-10-CM | POA: Diagnosis not present

## 2017-09-10 DIAGNOSIS — D472 Monoclonal gammopathy: Secondary | ICD-10-CM | POA: Diagnosis not present

## 2017-09-10 DIAGNOSIS — F331 Major depressive disorder, recurrent, moderate: Secondary | ICD-10-CM | POA: Diagnosis not present

## 2017-09-13 DIAGNOSIS — N186 End stage renal disease: Secondary | ICD-10-CM | POA: Diagnosis not present

## 2017-09-13 DIAGNOSIS — D472 Monoclonal gammopathy: Secondary | ICD-10-CM | POA: Diagnosis not present

## 2017-09-13 DIAGNOSIS — D509 Iron deficiency anemia, unspecified: Secondary | ICD-10-CM | POA: Diagnosis not present

## 2017-09-13 DIAGNOSIS — D631 Anemia in chronic kidney disease: Secondary | ICD-10-CM | POA: Diagnosis not present

## 2017-09-13 DIAGNOSIS — Z992 Dependence on renal dialysis: Secondary | ICD-10-CM | POA: Diagnosis not present

## 2017-09-13 DIAGNOSIS — N2581 Secondary hyperparathyroidism of renal origin: Secondary | ICD-10-CM | POA: Diagnosis not present

## 2017-09-15 DIAGNOSIS — Z992 Dependence on renal dialysis: Secondary | ICD-10-CM | POA: Diagnosis not present

## 2017-09-15 DIAGNOSIS — N186 End stage renal disease: Secondary | ICD-10-CM | POA: Diagnosis not present

## 2017-09-15 DIAGNOSIS — D509 Iron deficiency anemia, unspecified: Secondary | ICD-10-CM | POA: Diagnosis not present

## 2017-09-15 DIAGNOSIS — N2581 Secondary hyperparathyroidism of renal origin: Secondary | ICD-10-CM | POA: Diagnosis not present

## 2017-09-15 DIAGNOSIS — D631 Anemia in chronic kidney disease: Secondary | ICD-10-CM | POA: Diagnosis not present

## 2017-09-15 DIAGNOSIS — D472 Monoclonal gammopathy: Secondary | ICD-10-CM | POA: Diagnosis not present

## 2017-09-16 DIAGNOSIS — D649 Anemia, unspecified: Secondary | ICD-10-CM | POA: Diagnosis not present

## 2017-09-17 DIAGNOSIS — D631 Anemia in chronic kidney disease: Secondary | ICD-10-CM | POA: Diagnosis not present

## 2017-09-17 DIAGNOSIS — N2581 Secondary hyperparathyroidism of renal origin: Secondary | ICD-10-CM | POA: Diagnosis not present

## 2017-09-17 DIAGNOSIS — N186 End stage renal disease: Secondary | ICD-10-CM | POA: Diagnosis not present

## 2017-09-17 DIAGNOSIS — D472 Monoclonal gammopathy: Secondary | ICD-10-CM | POA: Diagnosis not present

## 2017-09-17 DIAGNOSIS — Z992 Dependence on renal dialysis: Secondary | ICD-10-CM | POA: Diagnosis not present

## 2017-09-17 DIAGNOSIS — D509 Iron deficiency anemia, unspecified: Secondary | ICD-10-CM | POA: Diagnosis not present

## 2017-09-20 DIAGNOSIS — N2581 Secondary hyperparathyroidism of renal origin: Secondary | ICD-10-CM | POA: Diagnosis not present

## 2017-09-20 DIAGNOSIS — D509 Iron deficiency anemia, unspecified: Secondary | ICD-10-CM | POA: Diagnosis not present

## 2017-09-20 DIAGNOSIS — D472 Monoclonal gammopathy: Secondary | ICD-10-CM | POA: Diagnosis not present

## 2017-09-20 DIAGNOSIS — D631 Anemia in chronic kidney disease: Secondary | ICD-10-CM | POA: Diagnosis not present

## 2017-09-20 DIAGNOSIS — Z992 Dependence on renal dialysis: Secondary | ICD-10-CM | POA: Diagnosis not present

## 2017-09-20 DIAGNOSIS — N186 End stage renal disease: Secondary | ICD-10-CM | POA: Diagnosis not present

## 2017-09-22 DIAGNOSIS — D509 Iron deficiency anemia, unspecified: Secondary | ICD-10-CM | POA: Diagnosis not present

## 2017-09-22 DIAGNOSIS — D472 Monoclonal gammopathy: Secondary | ICD-10-CM | POA: Diagnosis not present

## 2017-09-22 DIAGNOSIS — Z992 Dependence on renal dialysis: Secondary | ICD-10-CM | POA: Diagnosis not present

## 2017-09-22 DIAGNOSIS — D631 Anemia in chronic kidney disease: Secondary | ICD-10-CM | POA: Diagnosis not present

## 2017-09-22 DIAGNOSIS — N186 End stage renal disease: Secondary | ICD-10-CM | POA: Diagnosis not present

## 2017-09-22 DIAGNOSIS — N2581 Secondary hyperparathyroidism of renal origin: Secondary | ICD-10-CM | POA: Diagnosis not present

## 2017-09-24 DIAGNOSIS — D509 Iron deficiency anemia, unspecified: Secondary | ICD-10-CM | POA: Diagnosis not present

## 2017-09-24 DIAGNOSIS — D472 Monoclonal gammopathy: Secondary | ICD-10-CM | POA: Diagnosis not present

## 2017-09-24 DIAGNOSIS — D631 Anemia in chronic kidney disease: Secondary | ICD-10-CM | POA: Diagnosis not present

## 2017-09-24 DIAGNOSIS — N2581 Secondary hyperparathyroidism of renal origin: Secondary | ICD-10-CM | POA: Diagnosis not present

## 2017-09-24 DIAGNOSIS — Z992 Dependence on renal dialysis: Secondary | ICD-10-CM | POA: Diagnosis not present

## 2017-09-24 DIAGNOSIS — N186 End stage renal disease: Secondary | ICD-10-CM | POA: Diagnosis not present

## 2017-09-26 DIAGNOSIS — F411 Generalized anxiety disorder: Secondary | ICD-10-CM | POA: Diagnosis not present

## 2017-09-26 DIAGNOSIS — F331 Major depressive disorder, recurrent, moderate: Secondary | ICD-10-CM | POA: Diagnosis not present

## 2017-09-26 DIAGNOSIS — F5101 Primary insomnia: Secondary | ICD-10-CM | POA: Diagnosis not present

## 2017-09-27 DIAGNOSIS — N186 End stage renal disease: Secondary | ICD-10-CM | POA: Diagnosis not present

## 2017-09-27 DIAGNOSIS — N2581 Secondary hyperparathyroidism of renal origin: Secondary | ICD-10-CM | POA: Diagnosis not present

## 2017-09-27 DIAGNOSIS — D472 Monoclonal gammopathy: Secondary | ICD-10-CM | POA: Diagnosis not present

## 2017-09-27 DIAGNOSIS — D509 Iron deficiency anemia, unspecified: Secondary | ICD-10-CM | POA: Diagnosis not present

## 2017-09-27 DIAGNOSIS — Z992 Dependence on renal dialysis: Secondary | ICD-10-CM | POA: Diagnosis not present

## 2017-09-27 DIAGNOSIS — D631 Anemia in chronic kidney disease: Secondary | ICD-10-CM | POA: Diagnosis not present

## 2017-09-29 DIAGNOSIS — I10 Essential (primary) hypertension: Secondary | ICD-10-CM | POA: Diagnosis not present

## 2017-09-29 DIAGNOSIS — D631 Anemia in chronic kidney disease: Secondary | ICD-10-CM | POA: Diagnosis not present

## 2017-09-29 DIAGNOSIS — I480 Paroxysmal atrial fibrillation: Secondary | ICD-10-CM | POA: Diagnosis not present

## 2017-09-29 DIAGNOSIS — F339 Major depressive disorder, recurrent, unspecified: Secondary | ICD-10-CM | POA: Diagnosis not present

## 2017-09-29 DIAGNOSIS — D472 Monoclonal gammopathy: Secondary | ICD-10-CM | POA: Diagnosis not present

## 2017-09-29 DIAGNOSIS — K210 Gastro-esophageal reflux disease with esophagitis: Secondary | ICD-10-CM | POA: Diagnosis not present

## 2017-09-29 DIAGNOSIS — D509 Iron deficiency anemia, unspecified: Secondary | ICD-10-CM | POA: Diagnosis not present

## 2017-09-29 DIAGNOSIS — N2581 Secondary hyperparathyroidism of renal origin: Secondary | ICD-10-CM | POA: Diagnosis not present

## 2017-09-29 DIAGNOSIS — N186 End stage renal disease: Secondary | ICD-10-CM | POA: Diagnosis not present

## 2017-09-29 DIAGNOSIS — Z992 Dependence on renal dialysis: Secondary | ICD-10-CM | POA: Diagnosis not present

## 2017-09-30 DIAGNOSIS — D472 Monoclonal gammopathy: Secondary | ICD-10-CM | POA: Diagnosis not present

## 2017-09-30 DIAGNOSIS — N186 End stage renal disease: Secondary | ICD-10-CM | POA: Diagnosis not present

## 2017-09-30 DIAGNOSIS — Z992 Dependence on renal dialysis: Secondary | ICD-10-CM | POA: Diagnosis not present

## 2017-09-30 DIAGNOSIS — E611 Iron deficiency: Secondary | ICD-10-CM | POA: Diagnosis not present

## 2017-09-30 DIAGNOSIS — N2581 Secondary hyperparathyroidism of renal origin: Secondary | ICD-10-CM | POA: Diagnosis not present

## 2017-09-30 DIAGNOSIS — D631 Anemia in chronic kidney disease: Secondary | ICD-10-CM | POA: Diagnosis not present

## 2017-10-01 DIAGNOSIS — D472 Monoclonal gammopathy: Secondary | ICD-10-CM | POA: Diagnosis not present

## 2017-10-01 DIAGNOSIS — N186 End stage renal disease: Secondary | ICD-10-CM | POA: Diagnosis not present

## 2017-10-01 DIAGNOSIS — N2581 Secondary hyperparathyroidism of renal origin: Secondary | ICD-10-CM | POA: Diagnosis not present

## 2017-10-01 DIAGNOSIS — E611 Iron deficiency: Secondary | ICD-10-CM | POA: Diagnosis not present

## 2017-10-01 DIAGNOSIS — Z992 Dependence on renal dialysis: Secondary | ICD-10-CM | POA: Diagnosis not present

## 2017-10-01 DIAGNOSIS — D631 Anemia in chronic kidney disease: Secondary | ICD-10-CM | POA: Diagnosis not present

## 2017-10-04 DIAGNOSIS — D631 Anemia in chronic kidney disease: Secondary | ICD-10-CM | POA: Diagnosis not present

## 2017-10-04 DIAGNOSIS — Z992 Dependence on renal dialysis: Secondary | ICD-10-CM | POA: Diagnosis not present

## 2017-10-04 DIAGNOSIS — D472 Monoclonal gammopathy: Secondary | ICD-10-CM | POA: Diagnosis not present

## 2017-10-04 DIAGNOSIS — N2581 Secondary hyperparathyroidism of renal origin: Secondary | ICD-10-CM | POA: Diagnosis not present

## 2017-10-04 DIAGNOSIS — N186 End stage renal disease: Secondary | ICD-10-CM | POA: Diagnosis not present

## 2017-10-04 DIAGNOSIS — E611 Iron deficiency: Secondary | ICD-10-CM | POA: Diagnosis not present

## 2017-10-06 DIAGNOSIS — D472 Monoclonal gammopathy: Secondary | ICD-10-CM | POA: Diagnosis not present

## 2017-10-06 DIAGNOSIS — N2581 Secondary hyperparathyroidism of renal origin: Secondary | ICD-10-CM | POA: Diagnosis not present

## 2017-10-06 DIAGNOSIS — Z992 Dependence on renal dialysis: Secondary | ICD-10-CM | POA: Diagnosis not present

## 2017-10-06 DIAGNOSIS — N186 End stage renal disease: Secondary | ICD-10-CM | POA: Diagnosis not present

## 2017-10-06 DIAGNOSIS — D631 Anemia in chronic kidney disease: Secondary | ICD-10-CM | POA: Diagnosis not present

## 2017-10-06 DIAGNOSIS — E611 Iron deficiency: Secondary | ICD-10-CM | POA: Diagnosis not present

## 2017-10-08 DIAGNOSIS — D631 Anemia in chronic kidney disease: Secondary | ICD-10-CM | POA: Diagnosis not present

## 2017-10-08 DIAGNOSIS — F411 Generalized anxiety disorder: Secondary | ICD-10-CM | POA: Diagnosis not present

## 2017-10-08 DIAGNOSIS — E611 Iron deficiency: Secondary | ICD-10-CM | POA: Diagnosis not present

## 2017-10-08 DIAGNOSIS — Z992 Dependence on renal dialysis: Secondary | ICD-10-CM | POA: Diagnosis not present

## 2017-10-08 DIAGNOSIS — N2581 Secondary hyperparathyroidism of renal origin: Secondary | ICD-10-CM | POA: Diagnosis not present

## 2017-10-08 DIAGNOSIS — F331 Major depressive disorder, recurrent, moderate: Secondary | ICD-10-CM | POA: Diagnosis not present

## 2017-10-08 DIAGNOSIS — N186 End stage renal disease: Secondary | ICD-10-CM | POA: Diagnosis not present

## 2017-10-08 DIAGNOSIS — D472 Monoclonal gammopathy: Secondary | ICD-10-CM | POA: Diagnosis not present

## 2017-10-08 DIAGNOSIS — F5101 Primary insomnia: Secondary | ICD-10-CM | POA: Diagnosis not present

## 2017-10-13 DIAGNOSIS — N2581 Secondary hyperparathyroidism of renal origin: Secondary | ICD-10-CM | POA: Diagnosis not present

## 2017-10-13 DIAGNOSIS — N186 End stage renal disease: Secondary | ICD-10-CM | POA: Diagnosis not present

## 2017-10-13 DIAGNOSIS — Z992 Dependence on renal dialysis: Secondary | ICD-10-CM | POA: Diagnosis not present

## 2017-10-13 DIAGNOSIS — E611 Iron deficiency: Secondary | ICD-10-CM | POA: Diagnosis not present

## 2017-10-13 DIAGNOSIS — D472 Monoclonal gammopathy: Secondary | ICD-10-CM | POA: Diagnosis not present

## 2017-10-13 DIAGNOSIS — D631 Anemia in chronic kidney disease: Secondary | ICD-10-CM | POA: Diagnosis not present

## 2017-10-15 DIAGNOSIS — D631 Anemia in chronic kidney disease: Secondary | ICD-10-CM | POA: Diagnosis not present

## 2017-10-15 DIAGNOSIS — N2581 Secondary hyperparathyroidism of renal origin: Secondary | ICD-10-CM | POA: Diagnosis not present

## 2017-10-15 DIAGNOSIS — N186 End stage renal disease: Secondary | ICD-10-CM | POA: Diagnosis not present

## 2017-10-15 DIAGNOSIS — D472 Monoclonal gammopathy: Secondary | ICD-10-CM | POA: Diagnosis not present

## 2017-10-15 DIAGNOSIS — Z992 Dependence on renal dialysis: Secondary | ICD-10-CM | POA: Diagnosis not present

## 2017-10-15 DIAGNOSIS — E611 Iron deficiency: Secondary | ICD-10-CM | POA: Diagnosis not present

## 2017-10-18 DIAGNOSIS — D472 Monoclonal gammopathy: Secondary | ICD-10-CM | POA: Diagnosis not present

## 2017-10-18 DIAGNOSIS — N186 End stage renal disease: Secondary | ICD-10-CM | POA: Diagnosis not present

## 2017-10-18 DIAGNOSIS — D631 Anemia in chronic kidney disease: Secondary | ICD-10-CM | POA: Diagnosis not present

## 2017-10-18 DIAGNOSIS — N2581 Secondary hyperparathyroidism of renal origin: Secondary | ICD-10-CM | POA: Diagnosis not present

## 2017-10-18 DIAGNOSIS — Z992 Dependence on renal dialysis: Secondary | ICD-10-CM | POA: Diagnosis not present

## 2017-10-18 DIAGNOSIS — E611 Iron deficiency: Secondary | ICD-10-CM | POA: Diagnosis not present

## 2017-10-19 ENCOUNTER — Other Ambulatory Visit: Payer: Medicare Other

## 2017-10-19 DIAGNOSIS — B20 Human immunodeficiency virus [HIV] disease: Secondary | ICD-10-CM

## 2017-10-20 DIAGNOSIS — N186 End stage renal disease: Secondary | ICD-10-CM | POA: Diagnosis not present

## 2017-10-20 DIAGNOSIS — D472 Monoclonal gammopathy: Secondary | ICD-10-CM | POA: Diagnosis not present

## 2017-10-20 DIAGNOSIS — N2581 Secondary hyperparathyroidism of renal origin: Secondary | ICD-10-CM | POA: Diagnosis not present

## 2017-10-20 DIAGNOSIS — E611 Iron deficiency: Secondary | ICD-10-CM | POA: Diagnosis not present

## 2017-10-20 DIAGNOSIS — Z992 Dependence on renal dialysis: Secondary | ICD-10-CM | POA: Diagnosis not present

## 2017-10-20 DIAGNOSIS — D631 Anemia in chronic kidney disease: Secondary | ICD-10-CM | POA: Diagnosis not present

## 2017-10-21 LAB — T-HELPER CELL (CD4) - (RCID CLINIC ONLY)
CD4 % Helper T Cell: 33 % (ref 33–55)
CD4 T CELL ABS: 520 /uL (ref 400–2700)

## 2017-10-21 LAB — CBC
HCT: 34.4 % — ABNORMAL LOW (ref 35.0–45.0)
HEMOGLOBIN: 11.2 g/dL — AB (ref 11.7–15.5)
MCH: 34.5 pg — ABNORMAL HIGH (ref 27.0–33.0)
MCHC: 32.6 g/dL (ref 32.0–36.0)
MCV: 105.8 fL — ABNORMAL HIGH (ref 80.0–100.0)
MPV: 11.1 fL (ref 7.5–12.5)
Platelets: 156 10*3/uL (ref 140–400)
RBC: 3.25 10*6/uL — ABNORMAL LOW (ref 3.80–5.10)
RDW: 13.3 % (ref 11.0–15.0)
WBC: 3.7 10*3/uL — AB (ref 3.8–10.8)

## 2017-10-21 LAB — HIV-1 RNA QUANT-NO REFLEX-BLD
HIV 1 RNA Quant: <20 copies/mL
HIV-1 RNA QUANT, LOG: NOT DETECTED {Log_copies}/mL

## 2017-10-21 LAB — COMPREHENSIVE METABOLIC PANEL
AG Ratio: 1 (calc) (ref 1.0–2.5)
ALBUMIN MSPROF: 4 g/dL (ref 3.6–5.1)
ALKALINE PHOSPHATASE (APISO): 59 U/L (ref 33–130)
ALT: 11 U/L (ref 6–29)
AST: 17 U/L (ref 10–35)
BILIRUBIN TOTAL: 0.5 mg/dL (ref 0.2–1.2)
BUN/Creatinine Ratio: 5 (calc) — ABNORMAL LOW (ref 6–22)
BUN: 38 mg/dL — AB (ref 7–25)
CHLORIDE: 96 mmol/L — AB (ref 98–110)
CO2: 26 mmol/L (ref 20–32)
CREATININE: 7.19 mg/dL — AB (ref 0.50–0.99)
Calcium: 8.9 mg/dL (ref 8.6–10.4)
Globulin: 4.1 g/dL (calc) — ABNORMAL HIGH (ref 1.9–3.7)
Glucose, Bld: 72 mg/dL (ref 65–99)
POTASSIUM: 3.5 mmol/L (ref 3.5–5.3)
Sodium: 137 mmol/L (ref 135–146)
Total Protein: 8.1 g/dL (ref 6.1–8.1)

## 2017-10-22 DIAGNOSIS — N186 End stage renal disease: Secondary | ICD-10-CM | POA: Diagnosis not present

## 2017-10-22 DIAGNOSIS — Z992 Dependence on renal dialysis: Secondary | ICD-10-CM | POA: Diagnosis not present

## 2017-10-22 DIAGNOSIS — D472 Monoclonal gammopathy: Secondary | ICD-10-CM | POA: Diagnosis not present

## 2017-10-22 DIAGNOSIS — N2581 Secondary hyperparathyroidism of renal origin: Secondary | ICD-10-CM | POA: Diagnosis not present

## 2017-10-22 DIAGNOSIS — D631 Anemia in chronic kidney disease: Secondary | ICD-10-CM | POA: Diagnosis not present

## 2017-10-22 DIAGNOSIS — E611 Iron deficiency: Secondary | ICD-10-CM | POA: Diagnosis not present

## 2017-10-24 DIAGNOSIS — E611 Iron deficiency: Secondary | ICD-10-CM | POA: Diagnosis not present

## 2017-10-24 DIAGNOSIS — N2581 Secondary hyperparathyroidism of renal origin: Secondary | ICD-10-CM | POA: Diagnosis not present

## 2017-10-24 DIAGNOSIS — Z992 Dependence on renal dialysis: Secondary | ICD-10-CM | POA: Diagnosis not present

## 2017-10-24 DIAGNOSIS — D631 Anemia in chronic kidney disease: Secondary | ICD-10-CM | POA: Diagnosis not present

## 2017-10-24 DIAGNOSIS — D472 Monoclonal gammopathy: Secondary | ICD-10-CM | POA: Diagnosis not present

## 2017-10-24 DIAGNOSIS — N186 End stage renal disease: Secondary | ICD-10-CM | POA: Diagnosis not present

## 2017-10-25 DIAGNOSIS — D631 Anemia in chronic kidney disease: Secondary | ICD-10-CM | POA: Diagnosis not present

## 2017-10-25 DIAGNOSIS — N2581 Secondary hyperparathyroidism of renal origin: Secondary | ICD-10-CM | POA: Diagnosis not present

## 2017-10-25 DIAGNOSIS — D472 Monoclonal gammopathy: Secondary | ICD-10-CM | POA: Diagnosis not present

## 2017-10-25 DIAGNOSIS — E611 Iron deficiency: Secondary | ICD-10-CM | POA: Diagnosis not present

## 2017-10-25 DIAGNOSIS — Z992 Dependence on renal dialysis: Secondary | ICD-10-CM | POA: Diagnosis not present

## 2017-10-25 DIAGNOSIS — N186 End stage renal disease: Secondary | ICD-10-CM | POA: Diagnosis not present

## 2017-10-26 DIAGNOSIS — D649 Anemia, unspecified: Secondary | ICD-10-CM | POA: Diagnosis not present

## 2017-10-28 DIAGNOSIS — F411 Generalized anxiety disorder: Secondary | ICD-10-CM | POA: Diagnosis not present

## 2017-10-28 DIAGNOSIS — F331 Major depressive disorder, recurrent, moderate: Secondary | ICD-10-CM | POA: Diagnosis not present

## 2017-10-28 DIAGNOSIS — F5101 Primary insomnia: Secondary | ICD-10-CM | POA: Diagnosis not present

## 2017-10-29 DIAGNOSIS — N2581 Secondary hyperparathyroidism of renal origin: Secondary | ICD-10-CM | POA: Diagnosis not present

## 2017-10-29 DIAGNOSIS — D631 Anemia in chronic kidney disease: Secondary | ICD-10-CM | POA: Diagnosis not present

## 2017-10-29 DIAGNOSIS — Z992 Dependence on renal dialysis: Secondary | ICD-10-CM | POA: Diagnosis not present

## 2017-10-29 DIAGNOSIS — N186 End stage renal disease: Secondary | ICD-10-CM | POA: Diagnosis not present

## 2017-10-29 DIAGNOSIS — E611 Iron deficiency: Secondary | ICD-10-CM | POA: Diagnosis not present

## 2017-10-29 DIAGNOSIS — D472 Monoclonal gammopathy: Secondary | ICD-10-CM | POA: Diagnosis not present

## 2017-10-30 DIAGNOSIS — N186 End stage renal disease: Secondary | ICD-10-CM | POA: Diagnosis not present

## 2017-10-30 DIAGNOSIS — Z992 Dependence on renal dialysis: Secondary | ICD-10-CM | POA: Diagnosis not present

## 2017-10-31 DIAGNOSIS — D472 Monoclonal gammopathy: Secondary | ICD-10-CM | POA: Diagnosis not present

## 2017-10-31 DIAGNOSIS — N186 End stage renal disease: Secondary | ICD-10-CM | POA: Diagnosis not present

## 2017-10-31 DIAGNOSIS — D631 Anemia in chronic kidney disease: Secondary | ICD-10-CM | POA: Diagnosis not present

## 2017-10-31 DIAGNOSIS — N2581 Secondary hyperparathyroidism of renal origin: Secondary | ICD-10-CM | POA: Diagnosis not present

## 2017-10-31 DIAGNOSIS — Z992 Dependence on renal dialysis: Secondary | ICD-10-CM | POA: Diagnosis not present

## 2017-11-01 DIAGNOSIS — D631 Anemia in chronic kidney disease: Secondary | ICD-10-CM | POA: Diagnosis not present

## 2017-11-01 DIAGNOSIS — N186 End stage renal disease: Secondary | ICD-10-CM | POA: Diagnosis not present

## 2017-11-01 DIAGNOSIS — D472 Monoclonal gammopathy: Secondary | ICD-10-CM | POA: Diagnosis not present

## 2017-11-01 DIAGNOSIS — Z992 Dependence on renal dialysis: Secondary | ICD-10-CM | POA: Diagnosis not present

## 2017-11-01 DIAGNOSIS — N2581 Secondary hyperparathyroidism of renal origin: Secondary | ICD-10-CM | POA: Diagnosis not present

## 2017-11-02 ENCOUNTER — Ambulatory Visit (INDEPENDENT_AMBULATORY_CARE_PROVIDER_SITE_OTHER): Payer: Medicare Other | Admitting: Infectious Diseases

## 2017-11-02 ENCOUNTER — Encounter: Payer: Self-pay | Admitting: Infectious Diseases

## 2017-11-02 VITALS — BP 134/82 | HR 66 | Temp 98.2°F

## 2017-11-02 DIAGNOSIS — N186 End stage renal disease: Secondary | ICD-10-CM | POA: Diagnosis not present

## 2017-11-02 DIAGNOSIS — Z113 Encounter for screening for infections with a predominantly sexual mode of transmission: Secondary | ICD-10-CM

## 2017-11-02 DIAGNOSIS — Z23 Encounter for immunization: Secondary | ICD-10-CM | POA: Diagnosis not present

## 2017-11-02 DIAGNOSIS — Z79899 Other long term (current) drug therapy: Secondary | ICD-10-CM

## 2017-11-02 DIAGNOSIS — T827XXS Infection and inflammatory reaction due to other cardiac and vascular devices, implants and grafts, sequela: Secondary | ICD-10-CM | POA: Diagnosis not present

## 2017-11-02 DIAGNOSIS — Z992 Dependence on renal dialysis: Secondary | ICD-10-CM

## 2017-11-02 DIAGNOSIS — F1010 Alcohol abuse, uncomplicated: Secondary | ICD-10-CM

## 2017-11-02 DIAGNOSIS — B20 Human immunodeficiency virus [HIV] disease: Secondary | ICD-10-CM | POA: Diagnosis not present

## 2017-11-02 DIAGNOSIS — Z1383 Encounter for screening for respiratory disorder NEC: Secondary | ICD-10-CM

## 2017-11-02 DIAGNOSIS — I10 Essential (primary) hypertension: Secondary | ICD-10-CM | POA: Diagnosis not present

## 2017-11-02 NOTE — Assessment & Plan Note (Signed)
She is doing well Continue her current art, HD dosed.  Will give flu and PCV 13 rtc in 9 months.

## 2017-11-02 NOTE — Progress Notes (Signed)
   Subjective:    Patient ID: [name], female    DOB: February 25, 1951, 67 y.o.   MRN: [id number]  HPI 67 yo F with HIV+, ESRD, diastolic CHF, parox aflutter, prev normal cath at [facility] (2016).staying at SNF [facility]). She is on tivicay/zidovudine/epivir dosed for her HD.  She was in hospital 2-15 to 04-19-16 foroccluded right subclavian vein, with stenosis of right innominate vein. Patient seen by vascular surgery, pt had ballonangioplasty of right subclavian vein and innominate vein. Her RUE AVF was again repaired 03-29-17. She has had no recent problems with this.  103 # (per pt 48kg dry wt).  Has been tired, weak. Worse after HD.  No problems with medications- out of one, she is unsure which one this is.   HIV 1 RNA Quant (copies/mL)  Date Value  10/19/2017 <20 NOT DETECTED  04/22/2017 <20 NOT DETECTED  07/23/2015 51 (H)   CD4 T Cell Abs (/uL)  Date Value  10/19/2017 520  04/22/2017 450  07/23/2015 250 (L)    Review of Systems  Constitutional: Positive for fatigue.  Respiratory: Negative for shortness of breath.   Gastrointestinal: Positive for diarrhea. Negative for constipation.  Psychiatric/Behavioral: Negative for sleep disturbance.  anuric.  Please see HPI. All other systems reviewed and negative.     Objective:   Physical Exam  Constitutional: She appears well-developed and well-nourished.  HENT:  Mouth/Throat: No oropharyngeal exudate.  Eyes: EOM are normal.  Neck: Normal range of motion. Neck supple.  Cardiovascular: Normal rate, regular rhythm and normal heart sounds.  Pulmonary/Chest: Effort normal and breath sounds normal.  Abdominal: Soft. Bowel sounds are normal. She exhibits no distension. There is no tenderness.  Musculoskeletal: Normal range of motion. She exhibits no edema.       Arms: Lymphadenopathy:    She has no cervical adenopathy.  Psychiatric: She has a normal mood and affect.       Assessment & Plan:

## 2017-11-02 NOTE — Assessment & Plan Note (Signed)
Appreciate partnering with her HD doctors.  Ask that she f/u with her HD doctor about her concern about binders causing her loose Bm.

## 2017-11-02 NOTE — Assessment & Plan Note (Signed)
Will mark as resolved by her hx and by her living in SNF.

## 2017-11-02 NOTE — Assessment & Plan Note (Signed)
He fistula is currently clean, no evidence of infection.  Will mark as resolved.

## 2017-11-02 NOTE — Assessment & Plan Note (Signed)
Her BP is well controlled today.  Will continue to watch with her HD and PCP

## 2017-11-03 DIAGNOSIS — B20 Human immunodeficiency virus [HIV] disease: Secondary | ICD-10-CM | POA: Diagnosis present

## 2017-11-03 DIAGNOSIS — I10 Essential (primary) hypertension: Secondary | ICD-10-CM | POA: Diagnosis not present

## 2017-11-03 DIAGNOSIS — Z1383 Encounter for screening for respiratory disorder NEC: Secondary | ICD-10-CM | POA: Diagnosis not present

## 2017-11-03 DIAGNOSIS — N186 End stage renal disease: Secondary | ICD-10-CM | POA: Diagnosis not present

## 2017-11-03 DIAGNOSIS — Z23 Encounter for immunization: Secondary | ICD-10-CM | POA: Diagnosis not present

## 2017-11-03 DIAGNOSIS — Z79899 Other long term (current) drug therapy: Secondary | ICD-10-CM | POA: Diagnosis not present

## 2017-11-03 DIAGNOSIS — T827XXS Infection and inflammatory reaction due to other cardiac and vascular devices, implants and grafts, sequela: Secondary | ICD-10-CM | POA: Diagnosis not present

## 2017-11-03 DIAGNOSIS — Z992 Dependence on renal dialysis: Secondary | ICD-10-CM | POA: Diagnosis not present

## 2017-11-03 DIAGNOSIS — Z113 Encounter for screening for infections with a predominantly sexual mode of transmission: Secondary | ICD-10-CM | POA: Diagnosis not present

## 2017-11-03 DIAGNOSIS — F1010 Alcohol abuse, uncomplicated: Secondary | ICD-10-CM | POA: Diagnosis not present

## 2017-11-03 NOTE — Addendum Note (Signed)
Addended by: Lenore Cordia on: 11/03/2017 08:49 AM   Modules accepted: Orders

## 2017-11-05 DIAGNOSIS — N2581 Secondary hyperparathyroidism of renal origin: Secondary | ICD-10-CM | POA: Diagnosis not present

## 2017-11-05 DIAGNOSIS — N186 End stage renal disease: Secondary | ICD-10-CM | POA: Diagnosis not present

## 2017-11-05 DIAGNOSIS — D472 Monoclonal gammopathy: Secondary | ICD-10-CM | POA: Diagnosis not present

## 2017-11-05 DIAGNOSIS — Z992 Dependence on renal dialysis: Secondary | ICD-10-CM | POA: Diagnosis not present

## 2017-11-05 DIAGNOSIS — D631 Anemia in chronic kidney disease: Secondary | ICD-10-CM | POA: Diagnosis not present

## 2017-11-08 DIAGNOSIS — N186 End stage renal disease: Secondary | ICD-10-CM | POA: Diagnosis not present

## 2017-11-08 DIAGNOSIS — F339 Major depressive disorder, recurrent, unspecified: Secondary | ICD-10-CM | POA: Diagnosis not present

## 2017-11-08 DIAGNOSIS — K219 Gastro-esophageal reflux disease without esophagitis: Secondary | ICD-10-CM | POA: Diagnosis not present

## 2017-11-08 DIAGNOSIS — I10 Essential (primary) hypertension: Secondary | ICD-10-CM | POA: Diagnosis not present

## 2017-11-08 DIAGNOSIS — D631 Anemia in chronic kidney disease: Secondary | ICD-10-CM | POA: Diagnosis not present

## 2017-11-08 DIAGNOSIS — D472 Monoclonal gammopathy: Secondary | ICD-10-CM | POA: Diagnosis not present

## 2017-11-08 DIAGNOSIS — Z992 Dependence on renal dialysis: Secondary | ICD-10-CM | POA: Diagnosis not present

## 2017-11-08 DIAGNOSIS — K210 Gastro-esophageal reflux disease with esophagitis: Secondary | ICD-10-CM | POA: Diagnosis not present

## 2017-11-08 DIAGNOSIS — N2581 Secondary hyperparathyroidism of renal origin: Secondary | ICD-10-CM | POA: Diagnosis not present

## 2017-11-10 DIAGNOSIS — D472 Monoclonal gammopathy: Secondary | ICD-10-CM | POA: Diagnosis not present

## 2017-11-10 DIAGNOSIS — D631 Anemia in chronic kidney disease: Secondary | ICD-10-CM | POA: Diagnosis not present

## 2017-11-10 DIAGNOSIS — N186 End stage renal disease: Secondary | ICD-10-CM | POA: Diagnosis not present

## 2017-11-10 DIAGNOSIS — Z992 Dependence on renal dialysis: Secondary | ICD-10-CM | POA: Diagnosis not present

## 2017-11-10 DIAGNOSIS — N2581 Secondary hyperparathyroidism of renal origin: Secondary | ICD-10-CM | POA: Diagnosis not present

## 2017-11-12 DIAGNOSIS — N2581 Secondary hyperparathyroidism of renal origin: Secondary | ICD-10-CM | POA: Diagnosis not present

## 2017-11-12 DIAGNOSIS — N186 End stage renal disease: Secondary | ICD-10-CM | POA: Diagnosis not present

## 2017-11-12 DIAGNOSIS — Z992 Dependence on renal dialysis: Secondary | ICD-10-CM | POA: Diagnosis not present

## 2017-11-12 DIAGNOSIS — D631 Anemia in chronic kidney disease: Secondary | ICD-10-CM | POA: Diagnosis not present

## 2017-11-12 DIAGNOSIS — D472 Monoclonal gammopathy: Secondary | ICD-10-CM | POA: Diagnosis not present

## 2017-11-15 DIAGNOSIS — D631 Anemia in chronic kidney disease: Secondary | ICD-10-CM | POA: Diagnosis not present

## 2017-11-15 DIAGNOSIS — N2581 Secondary hyperparathyroidism of renal origin: Secondary | ICD-10-CM | POA: Diagnosis not present

## 2017-11-15 DIAGNOSIS — Z992 Dependence on renal dialysis: Secondary | ICD-10-CM | POA: Diagnosis not present

## 2017-11-15 DIAGNOSIS — N186 End stage renal disease: Secondary | ICD-10-CM | POA: Diagnosis not present

## 2017-11-15 DIAGNOSIS — D472 Monoclonal gammopathy: Secondary | ICD-10-CM | POA: Diagnosis not present

## 2017-11-17 DIAGNOSIS — I739 Peripheral vascular disease, unspecified: Secondary | ICD-10-CM | POA: Diagnosis not present

## 2017-11-17 DIAGNOSIS — Z992 Dependence on renal dialysis: Secondary | ICD-10-CM | POA: Diagnosis not present

## 2017-11-17 DIAGNOSIS — D472 Monoclonal gammopathy: Secondary | ICD-10-CM | POA: Diagnosis not present

## 2017-11-17 DIAGNOSIS — B351 Tinea unguium: Secondary | ICD-10-CM | POA: Diagnosis not present

## 2017-11-17 DIAGNOSIS — N2581 Secondary hyperparathyroidism of renal origin: Secondary | ICD-10-CM | POA: Diagnosis not present

## 2017-11-17 DIAGNOSIS — N186 End stage renal disease: Secondary | ICD-10-CM | POA: Diagnosis not present

## 2017-11-17 DIAGNOSIS — D631 Anemia in chronic kidney disease: Secondary | ICD-10-CM | POA: Diagnosis not present

## 2017-11-19 DIAGNOSIS — Z992 Dependence on renal dialysis: Secondary | ICD-10-CM | POA: Diagnosis not present

## 2017-11-19 DIAGNOSIS — D472 Monoclonal gammopathy: Secondary | ICD-10-CM | POA: Diagnosis not present

## 2017-11-19 DIAGNOSIS — N186 End stage renal disease: Secondary | ICD-10-CM | POA: Diagnosis not present

## 2017-11-19 DIAGNOSIS — N2581 Secondary hyperparathyroidism of renal origin: Secondary | ICD-10-CM | POA: Diagnosis not present

## 2017-11-19 DIAGNOSIS — D631 Anemia in chronic kidney disease: Secondary | ICD-10-CM | POA: Diagnosis not present

## 2017-11-22 DIAGNOSIS — D472 Monoclonal gammopathy: Secondary | ICD-10-CM | POA: Diagnosis not present

## 2017-11-22 DIAGNOSIS — N2581 Secondary hyperparathyroidism of renal origin: Secondary | ICD-10-CM | POA: Diagnosis not present

## 2017-11-22 DIAGNOSIS — D631 Anemia in chronic kidney disease: Secondary | ICD-10-CM | POA: Diagnosis not present

## 2017-11-22 DIAGNOSIS — N186 End stage renal disease: Secondary | ICD-10-CM | POA: Diagnosis not present

## 2017-11-22 DIAGNOSIS — Z992 Dependence on renal dialysis: Secondary | ICD-10-CM | POA: Diagnosis not present

## 2017-11-23 DIAGNOSIS — Z992 Dependence on renal dialysis: Secondary | ICD-10-CM | POA: Diagnosis not present

## 2017-11-23 DIAGNOSIS — D631 Anemia in chronic kidney disease: Secondary | ICD-10-CM | POA: Diagnosis not present

## 2017-11-23 DIAGNOSIS — N2581 Secondary hyperparathyroidism of renal origin: Secondary | ICD-10-CM | POA: Diagnosis not present

## 2017-11-23 DIAGNOSIS — N186 End stage renal disease: Secondary | ICD-10-CM | POA: Diagnosis not present

## 2017-11-23 DIAGNOSIS — D472 Monoclonal gammopathy: Secondary | ICD-10-CM | POA: Diagnosis not present

## 2017-11-26 DIAGNOSIS — D472 Monoclonal gammopathy: Secondary | ICD-10-CM | POA: Diagnosis not present

## 2017-11-26 DIAGNOSIS — N186 End stage renal disease: Secondary | ICD-10-CM | POA: Diagnosis not present

## 2017-11-26 DIAGNOSIS — D631 Anemia in chronic kidney disease: Secondary | ICD-10-CM | POA: Diagnosis not present

## 2017-11-26 DIAGNOSIS — N2581 Secondary hyperparathyroidism of renal origin: Secondary | ICD-10-CM | POA: Diagnosis not present

## 2017-11-26 DIAGNOSIS — Z992 Dependence on renal dialysis: Secondary | ICD-10-CM | POA: Diagnosis not present

## 2017-11-29 DIAGNOSIS — D472 Monoclonal gammopathy: Secondary | ICD-10-CM | POA: Diagnosis not present

## 2017-11-29 DIAGNOSIS — N2581 Secondary hyperparathyroidism of renal origin: Secondary | ICD-10-CM | POA: Diagnosis not present

## 2017-11-29 DIAGNOSIS — Z992 Dependence on renal dialysis: Secondary | ICD-10-CM | POA: Diagnosis not present

## 2017-11-29 DIAGNOSIS — N186 End stage renal disease: Secondary | ICD-10-CM | POA: Diagnosis not present

## 2017-11-29 DIAGNOSIS — D631 Anemia in chronic kidney disease: Secondary | ICD-10-CM | POA: Diagnosis not present

## 2017-11-30 DIAGNOSIS — N186 End stage renal disease: Secondary | ICD-10-CM | POA: Diagnosis not present

## 2017-11-30 DIAGNOSIS — D631 Anemia in chronic kidney disease: Secondary | ICD-10-CM | POA: Diagnosis not present

## 2017-11-30 DIAGNOSIS — D509 Iron deficiency anemia, unspecified: Secondary | ICD-10-CM | POA: Diagnosis not present

## 2017-11-30 DIAGNOSIS — Z992 Dependence on renal dialysis: Secondary | ICD-10-CM | POA: Diagnosis not present

## 2017-11-30 DIAGNOSIS — N2581 Secondary hyperparathyroidism of renal origin: Secondary | ICD-10-CM | POA: Diagnosis not present

## 2017-11-30 DIAGNOSIS — D472 Monoclonal gammopathy: Secondary | ICD-10-CM | POA: Diagnosis not present

## 2017-12-03 DIAGNOSIS — N186 End stage renal disease: Secondary | ICD-10-CM | POA: Diagnosis not present

## 2017-12-03 DIAGNOSIS — D509 Iron deficiency anemia, unspecified: Secondary | ICD-10-CM | POA: Diagnosis not present

## 2017-12-03 DIAGNOSIS — D472 Monoclonal gammopathy: Secondary | ICD-10-CM | POA: Diagnosis not present

## 2017-12-03 DIAGNOSIS — D631 Anemia in chronic kidney disease: Secondary | ICD-10-CM | POA: Diagnosis not present

## 2017-12-03 DIAGNOSIS — N2581 Secondary hyperparathyroidism of renal origin: Secondary | ICD-10-CM | POA: Diagnosis not present

## 2017-12-03 DIAGNOSIS — Z992 Dependence on renal dialysis: Secondary | ICD-10-CM | POA: Diagnosis not present

## 2017-12-06 DIAGNOSIS — N186 End stage renal disease: Secondary | ICD-10-CM | POA: Diagnosis not present

## 2017-12-06 DIAGNOSIS — Z992 Dependence on renal dialysis: Secondary | ICD-10-CM | POA: Diagnosis not present

## 2017-12-06 DIAGNOSIS — D472 Monoclonal gammopathy: Secondary | ICD-10-CM | POA: Diagnosis not present

## 2017-12-06 DIAGNOSIS — D631 Anemia in chronic kidney disease: Secondary | ICD-10-CM | POA: Diagnosis not present

## 2017-12-06 DIAGNOSIS — N2581 Secondary hyperparathyroidism of renal origin: Secondary | ICD-10-CM | POA: Diagnosis not present

## 2017-12-06 DIAGNOSIS — D509 Iron deficiency anemia, unspecified: Secondary | ICD-10-CM | POA: Diagnosis not present

## 2017-12-08 DIAGNOSIS — N2581 Secondary hyperparathyroidism of renal origin: Secondary | ICD-10-CM | POA: Diagnosis not present

## 2017-12-08 DIAGNOSIS — N186 End stage renal disease: Secondary | ICD-10-CM | POA: Diagnosis not present

## 2017-12-08 DIAGNOSIS — D509 Iron deficiency anemia, unspecified: Secondary | ICD-10-CM | POA: Diagnosis not present

## 2017-12-08 DIAGNOSIS — D472 Monoclonal gammopathy: Secondary | ICD-10-CM | POA: Diagnosis not present

## 2017-12-08 DIAGNOSIS — D631 Anemia in chronic kidney disease: Secondary | ICD-10-CM | POA: Diagnosis not present

## 2017-12-08 DIAGNOSIS — Z992 Dependence on renal dialysis: Secondary | ICD-10-CM | POA: Diagnosis not present

## 2017-12-10 DIAGNOSIS — N186 End stage renal disease: Secondary | ICD-10-CM | POA: Diagnosis not present

## 2017-12-10 DIAGNOSIS — D631 Anemia in chronic kidney disease: Secondary | ICD-10-CM | POA: Diagnosis not present

## 2017-12-10 DIAGNOSIS — N2581 Secondary hyperparathyroidism of renal origin: Secondary | ICD-10-CM | POA: Diagnosis not present

## 2017-12-10 DIAGNOSIS — D509 Iron deficiency anemia, unspecified: Secondary | ICD-10-CM | POA: Diagnosis not present

## 2017-12-10 DIAGNOSIS — Z992 Dependence on renal dialysis: Secondary | ICD-10-CM | POA: Diagnosis not present

## 2017-12-10 DIAGNOSIS — D472 Monoclonal gammopathy: Secondary | ICD-10-CM | POA: Diagnosis not present

## 2017-12-13 DIAGNOSIS — D631 Anemia in chronic kidney disease: Secondary | ICD-10-CM | POA: Diagnosis not present

## 2017-12-13 DIAGNOSIS — Z992 Dependence on renal dialysis: Secondary | ICD-10-CM | POA: Diagnosis not present

## 2017-12-13 DIAGNOSIS — D509 Iron deficiency anemia, unspecified: Secondary | ICD-10-CM | POA: Diagnosis not present

## 2017-12-13 DIAGNOSIS — N186 End stage renal disease: Secondary | ICD-10-CM | POA: Diagnosis not present

## 2017-12-13 DIAGNOSIS — D472 Monoclonal gammopathy: Secondary | ICD-10-CM | POA: Diagnosis not present

## 2017-12-13 DIAGNOSIS — N2581 Secondary hyperparathyroidism of renal origin: Secondary | ICD-10-CM | POA: Diagnosis not present

## 2017-12-17 DIAGNOSIS — D631 Anemia in chronic kidney disease: Secondary | ICD-10-CM | POA: Diagnosis not present

## 2017-12-17 DIAGNOSIS — N186 End stage renal disease: Secondary | ICD-10-CM | POA: Diagnosis not present

## 2017-12-17 DIAGNOSIS — N2581 Secondary hyperparathyroidism of renal origin: Secondary | ICD-10-CM | POA: Diagnosis not present

## 2017-12-17 DIAGNOSIS — Z992 Dependence on renal dialysis: Secondary | ICD-10-CM | POA: Diagnosis not present

## 2017-12-17 DIAGNOSIS — D472 Monoclonal gammopathy: Secondary | ICD-10-CM | POA: Diagnosis not present

## 2017-12-17 DIAGNOSIS — D509 Iron deficiency anemia, unspecified: Secondary | ICD-10-CM | POA: Diagnosis not present

## 2017-12-20 DIAGNOSIS — D472 Monoclonal gammopathy: Secondary | ICD-10-CM | POA: Diagnosis not present

## 2017-12-20 DIAGNOSIS — Z992 Dependence on renal dialysis: Secondary | ICD-10-CM | POA: Diagnosis not present

## 2017-12-20 DIAGNOSIS — D509 Iron deficiency anemia, unspecified: Secondary | ICD-10-CM | POA: Diagnosis not present

## 2017-12-20 DIAGNOSIS — N2581 Secondary hyperparathyroidism of renal origin: Secondary | ICD-10-CM | POA: Diagnosis not present

## 2017-12-20 DIAGNOSIS — D631 Anemia in chronic kidney disease: Secondary | ICD-10-CM | POA: Diagnosis not present

## 2017-12-20 DIAGNOSIS — N186 End stage renal disease: Secondary | ICD-10-CM | POA: Diagnosis not present

## 2017-12-22 DIAGNOSIS — D631 Anemia in chronic kidney disease: Secondary | ICD-10-CM | POA: Diagnosis not present

## 2017-12-22 DIAGNOSIS — D509 Iron deficiency anemia, unspecified: Secondary | ICD-10-CM | POA: Diagnosis not present

## 2017-12-22 DIAGNOSIS — N186 End stage renal disease: Secondary | ICD-10-CM | POA: Diagnosis not present

## 2017-12-22 DIAGNOSIS — Z992 Dependence on renal dialysis: Secondary | ICD-10-CM | POA: Diagnosis not present

## 2017-12-22 DIAGNOSIS — N2581 Secondary hyperparathyroidism of renal origin: Secondary | ICD-10-CM | POA: Diagnosis not present

## 2017-12-22 DIAGNOSIS — D472 Monoclonal gammopathy: Secondary | ICD-10-CM | POA: Diagnosis not present

## 2017-12-23 DIAGNOSIS — Z992 Dependence on renal dialysis: Secondary | ICD-10-CM | POA: Diagnosis not present

## 2017-12-23 DIAGNOSIS — D631 Anemia in chronic kidney disease: Secondary | ICD-10-CM | POA: Diagnosis not present

## 2017-12-23 DIAGNOSIS — N186 End stage renal disease: Secondary | ICD-10-CM | POA: Diagnosis not present

## 2017-12-23 DIAGNOSIS — I480 Paroxysmal atrial fibrillation: Secondary | ICD-10-CM | POA: Diagnosis not present

## 2017-12-23 DIAGNOSIS — B20 Human immunodeficiency virus [HIV] disease: Secondary | ICD-10-CM | POA: Diagnosis not present

## 2017-12-23 DIAGNOSIS — N2581 Secondary hyperparathyroidism of renal origin: Secondary | ICD-10-CM | POA: Diagnosis not present

## 2017-12-23 DIAGNOSIS — D472 Monoclonal gammopathy: Secondary | ICD-10-CM | POA: Diagnosis not present

## 2017-12-23 DIAGNOSIS — D509 Iron deficiency anemia, unspecified: Secondary | ICD-10-CM | POA: Diagnosis not present

## 2017-12-23 DIAGNOSIS — Z8673 Personal history of transient ischemic attack (TIA), and cerebral infarction without residual deficits: Secondary | ICD-10-CM | POA: Diagnosis not present

## 2017-12-24 DIAGNOSIS — Z992 Dependence on renal dialysis: Secondary | ICD-10-CM | POA: Diagnosis not present

## 2017-12-24 DIAGNOSIS — D509 Iron deficiency anemia, unspecified: Secondary | ICD-10-CM | POA: Diagnosis not present

## 2017-12-24 DIAGNOSIS — D472 Monoclonal gammopathy: Secondary | ICD-10-CM | POA: Diagnosis not present

## 2017-12-24 DIAGNOSIS — D631 Anemia in chronic kidney disease: Secondary | ICD-10-CM | POA: Diagnosis not present

## 2017-12-24 DIAGNOSIS — F5101 Primary insomnia: Secondary | ICD-10-CM | POA: Diagnosis not present

## 2017-12-24 DIAGNOSIS — F411 Generalized anxiety disorder: Secondary | ICD-10-CM | POA: Diagnosis not present

## 2017-12-24 DIAGNOSIS — F331 Major depressive disorder, recurrent, moderate: Secondary | ICD-10-CM | POA: Diagnosis not present

## 2017-12-24 DIAGNOSIS — N2581 Secondary hyperparathyroidism of renal origin: Secondary | ICD-10-CM | POA: Diagnosis not present

## 2017-12-24 DIAGNOSIS — N186 End stage renal disease: Secondary | ICD-10-CM | POA: Diagnosis not present

## 2017-12-27 DIAGNOSIS — N2581 Secondary hyperparathyroidism of renal origin: Secondary | ICD-10-CM | POA: Diagnosis not present

## 2017-12-27 DIAGNOSIS — D472 Monoclonal gammopathy: Secondary | ICD-10-CM | POA: Diagnosis not present

## 2017-12-27 DIAGNOSIS — N186 End stage renal disease: Secondary | ICD-10-CM | POA: Diagnosis not present

## 2017-12-27 DIAGNOSIS — D631 Anemia in chronic kidney disease: Secondary | ICD-10-CM | POA: Diagnosis not present

## 2017-12-27 DIAGNOSIS — Z992 Dependence on renal dialysis: Secondary | ICD-10-CM | POA: Diagnosis not present

## 2017-12-27 DIAGNOSIS — D509 Iron deficiency anemia, unspecified: Secondary | ICD-10-CM | POA: Diagnosis not present

## 2017-12-30 DIAGNOSIS — Z992 Dependence on renal dialysis: Secondary | ICD-10-CM | POA: Diagnosis not present

## 2017-12-30 DIAGNOSIS — N186 End stage renal disease: Secondary | ICD-10-CM | POA: Diagnosis not present

## 2017-12-31 DIAGNOSIS — N2581 Secondary hyperparathyroidism of renal origin: Secondary | ICD-10-CM | POA: Diagnosis not present

## 2017-12-31 DIAGNOSIS — D472 Monoclonal gammopathy: Secondary | ICD-10-CM | POA: Diagnosis not present

## 2017-12-31 DIAGNOSIS — Z992 Dependence on renal dialysis: Secondary | ICD-10-CM | POA: Diagnosis not present

## 2017-12-31 DIAGNOSIS — D631 Anemia in chronic kidney disease: Secondary | ICD-10-CM | POA: Diagnosis not present

## 2017-12-31 DIAGNOSIS — N186 End stage renal disease: Secondary | ICD-10-CM | POA: Diagnosis not present

## 2017-12-31 DIAGNOSIS — D509 Iron deficiency anemia, unspecified: Secondary | ICD-10-CM | POA: Diagnosis not present

## 2017-12-31 DIAGNOSIS — R112 Nausea with vomiting, unspecified: Secondary | ICD-10-CM | POA: Diagnosis not present

## 2018-01-05 DIAGNOSIS — N2581 Secondary hyperparathyroidism of renal origin: Secondary | ICD-10-CM | POA: Diagnosis not present

## 2018-01-05 DIAGNOSIS — N186 End stage renal disease: Secondary | ICD-10-CM | POA: Diagnosis not present

## 2018-01-05 DIAGNOSIS — R112 Nausea with vomiting, unspecified: Secondary | ICD-10-CM | POA: Diagnosis not present

## 2018-01-05 DIAGNOSIS — D472 Monoclonal gammopathy: Secondary | ICD-10-CM | POA: Diagnosis not present

## 2018-01-05 DIAGNOSIS — Z992 Dependence on renal dialysis: Secondary | ICD-10-CM | POA: Diagnosis not present

## 2018-01-05 DIAGNOSIS — D509 Iron deficiency anemia, unspecified: Secondary | ICD-10-CM | POA: Diagnosis not present

## 2018-01-07 DIAGNOSIS — N2581 Secondary hyperparathyroidism of renal origin: Secondary | ICD-10-CM | POA: Diagnosis not present

## 2018-01-07 DIAGNOSIS — D472 Monoclonal gammopathy: Secondary | ICD-10-CM | POA: Diagnosis not present

## 2018-01-07 DIAGNOSIS — R112 Nausea with vomiting, unspecified: Secondary | ICD-10-CM | POA: Diagnosis not present

## 2018-01-07 DIAGNOSIS — D509 Iron deficiency anemia, unspecified: Secondary | ICD-10-CM | POA: Diagnosis not present

## 2018-01-07 DIAGNOSIS — N186 End stage renal disease: Secondary | ICD-10-CM | POA: Diagnosis not present

## 2018-01-07 DIAGNOSIS — Z992 Dependence on renal dialysis: Secondary | ICD-10-CM | POA: Diagnosis not present

## 2018-01-08 DIAGNOSIS — N186 End stage renal disease: Secondary | ICD-10-CM | POA: Diagnosis not present

## 2018-01-08 DIAGNOSIS — I480 Paroxysmal atrial fibrillation: Secondary | ICD-10-CM | POA: Diagnosis not present

## 2018-01-08 DIAGNOSIS — B20 Human immunodeficiency virus [HIV] disease: Secondary | ICD-10-CM | POA: Diagnosis not present

## 2018-01-08 DIAGNOSIS — F331 Major depressive disorder, recurrent, moderate: Secondary | ICD-10-CM | POA: Diagnosis not present

## 2018-01-12 DIAGNOSIS — N186 End stage renal disease: Secondary | ICD-10-CM | POA: Diagnosis not present

## 2018-01-12 DIAGNOSIS — Z1159 Encounter for screening for other viral diseases: Secondary | ICD-10-CM | POA: Diagnosis not present

## 2018-01-12 DIAGNOSIS — N2581 Secondary hyperparathyroidism of renal origin: Secondary | ICD-10-CM | POA: Diagnosis not present

## 2018-01-12 DIAGNOSIS — D509 Iron deficiency anemia, unspecified: Secondary | ICD-10-CM | POA: Diagnosis not present

## 2018-01-12 DIAGNOSIS — D472 Monoclonal gammopathy: Secondary | ICD-10-CM | POA: Diagnosis not present

## 2018-01-12 DIAGNOSIS — Z992 Dependence on renal dialysis: Secondary | ICD-10-CM | POA: Diagnosis not present

## 2018-01-12 DIAGNOSIS — R112 Nausea with vomiting, unspecified: Secondary | ICD-10-CM | POA: Diagnosis not present

## 2018-01-14 DIAGNOSIS — Z992 Dependence on renal dialysis: Secondary | ICD-10-CM | POA: Diagnosis not present

## 2018-01-14 DIAGNOSIS — D472 Monoclonal gammopathy: Secondary | ICD-10-CM | POA: Diagnosis not present

## 2018-01-14 DIAGNOSIS — N2581 Secondary hyperparathyroidism of renal origin: Secondary | ICD-10-CM | POA: Diagnosis not present

## 2018-01-14 DIAGNOSIS — R112 Nausea with vomiting, unspecified: Secondary | ICD-10-CM | POA: Diagnosis not present

## 2018-01-14 DIAGNOSIS — D509 Iron deficiency anemia, unspecified: Secondary | ICD-10-CM | POA: Diagnosis not present

## 2018-01-14 DIAGNOSIS — N186 End stage renal disease: Secondary | ICD-10-CM | POA: Diagnosis not present

## 2018-01-17 DIAGNOSIS — Z992 Dependence on renal dialysis: Secondary | ICD-10-CM | POA: Diagnosis not present

## 2018-01-17 DIAGNOSIS — D509 Iron deficiency anemia, unspecified: Secondary | ICD-10-CM | POA: Diagnosis not present

## 2018-01-17 DIAGNOSIS — R112 Nausea with vomiting, unspecified: Secondary | ICD-10-CM | POA: Diagnosis not present

## 2018-01-17 DIAGNOSIS — D472 Monoclonal gammopathy: Secondary | ICD-10-CM | POA: Diagnosis not present

## 2018-01-17 DIAGNOSIS — N186 End stage renal disease: Secondary | ICD-10-CM | POA: Diagnosis not present

## 2018-01-17 DIAGNOSIS — N2581 Secondary hyperparathyroidism of renal origin: Secondary | ICD-10-CM | POA: Diagnosis not present

## 2018-01-19 DIAGNOSIS — N186 End stage renal disease: Secondary | ICD-10-CM | POA: Diagnosis not present

## 2018-01-19 DIAGNOSIS — D472 Monoclonal gammopathy: Secondary | ICD-10-CM | POA: Diagnosis not present

## 2018-01-19 DIAGNOSIS — N2581 Secondary hyperparathyroidism of renal origin: Secondary | ICD-10-CM | POA: Diagnosis not present

## 2018-01-19 DIAGNOSIS — Z992 Dependence on renal dialysis: Secondary | ICD-10-CM | POA: Diagnosis not present

## 2018-01-19 DIAGNOSIS — D509 Iron deficiency anemia, unspecified: Secondary | ICD-10-CM | POA: Diagnosis not present

## 2018-01-19 DIAGNOSIS — R112 Nausea with vomiting, unspecified: Secondary | ICD-10-CM | POA: Diagnosis not present

## 2018-01-20 DIAGNOSIS — Z79899 Other long term (current) drug therapy: Secondary | ICD-10-CM | POA: Diagnosis not present

## 2018-01-22 DIAGNOSIS — D509 Iron deficiency anemia, unspecified: Secondary | ICD-10-CM | POA: Diagnosis not present

## 2018-01-22 DIAGNOSIS — N2581 Secondary hyperparathyroidism of renal origin: Secondary | ICD-10-CM | POA: Diagnosis not present

## 2018-01-22 DIAGNOSIS — D472 Monoclonal gammopathy: Secondary | ICD-10-CM | POA: Diagnosis not present

## 2018-01-22 DIAGNOSIS — Z992 Dependence on renal dialysis: Secondary | ICD-10-CM | POA: Diagnosis not present

## 2018-01-22 DIAGNOSIS — R112 Nausea with vomiting, unspecified: Secondary | ICD-10-CM | POA: Diagnosis not present

## 2018-01-22 DIAGNOSIS — N186 End stage renal disease: Secondary | ICD-10-CM | POA: Diagnosis not present

## 2018-01-24 DIAGNOSIS — N2581 Secondary hyperparathyroidism of renal origin: Secondary | ICD-10-CM | POA: Diagnosis not present

## 2018-01-24 DIAGNOSIS — N186 End stage renal disease: Secondary | ICD-10-CM | POA: Diagnosis not present

## 2018-01-24 DIAGNOSIS — D472 Monoclonal gammopathy: Secondary | ICD-10-CM | POA: Diagnosis not present

## 2018-01-24 DIAGNOSIS — Z992 Dependence on renal dialysis: Secondary | ICD-10-CM | POA: Diagnosis not present

## 2018-01-24 DIAGNOSIS — R112 Nausea with vomiting, unspecified: Secondary | ICD-10-CM | POA: Diagnosis not present

## 2018-01-24 DIAGNOSIS — D509 Iron deficiency anemia, unspecified: Secondary | ICD-10-CM | POA: Diagnosis not present

## 2018-01-26 DIAGNOSIS — Z992 Dependence on renal dialysis: Secondary | ICD-10-CM | POA: Diagnosis not present

## 2018-01-26 DIAGNOSIS — D509 Iron deficiency anemia, unspecified: Secondary | ICD-10-CM | POA: Diagnosis not present

## 2018-01-26 DIAGNOSIS — N2581 Secondary hyperparathyroidism of renal origin: Secondary | ICD-10-CM | POA: Diagnosis not present

## 2018-01-26 DIAGNOSIS — D472 Monoclonal gammopathy: Secondary | ICD-10-CM | POA: Diagnosis not present

## 2018-01-26 DIAGNOSIS — R112 Nausea with vomiting, unspecified: Secondary | ICD-10-CM | POA: Diagnosis not present

## 2018-01-26 DIAGNOSIS — N186 End stage renal disease: Secondary | ICD-10-CM | POA: Diagnosis not present

## 2018-01-28 DIAGNOSIS — D472 Monoclonal gammopathy: Secondary | ICD-10-CM | POA: Diagnosis not present

## 2018-01-28 DIAGNOSIS — D509 Iron deficiency anemia, unspecified: Secondary | ICD-10-CM | POA: Diagnosis not present

## 2018-01-28 DIAGNOSIS — R112 Nausea with vomiting, unspecified: Secondary | ICD-10-CM | POA: Diagnosis not present

## 2018-01-28 DIAGNOSIS — N2581 Secondary hyperparathyroidism of renal origin: Secondary | ICD-10-CM | POA: Diagnosis not present

## 2018-01-28 DIAGNOSIS — N186 End stage renal disease: Secondary | ICD-10-CM | POA: Diagnosis not present

## 2018-01-28 DIAGNOSIS — Z992 Dependence on renal dialysis: Secondary | ICD-10-CM | POA: Diagnosis not present

## 2018-01-29 DIAGNOSIS — Z992 Dependence on renal dialysis: Secondary | ICD-10-CM | POA: Diagnosis not present

## 2018-01-29 DIAGNOSIS — N186 End stage renal disease: Secondary | ICD-10-CM | POA: Diagnosis not present

## 2018-01-30 DIAGNOSIS — Z992 Dependence on renal dialysis: Secondary | ICD-10-CM | POA: Diagnosis not present

## 2018-01-30 DIAGNOSIS — E611 Iron deficiency: Secondary | ICD-10-CM | POA: Diagnosis not present

## 2018-01-30 DIAGNOSIS — D631 Anemia in chronic kidney disease: Secondary | ICD-10-CM | POA: Diagnosis not present

## 2018-01-30 DIAGNOSIS — D472 Monoclonal gammopathy: Secondary | ICD-10-CM | POA: Diagnosis not present

## 2018-01-30 DIAGNOSIS — N186 End stage renal disease: Secondary | ICD-10-CM | POA: Diagnosis not present

## 2018-01-30 DIAGNOSIS — N2581 Secondary hyperparathyroidism of renal origin: Secondary | ICD-10-CM | POA: Diagnosis not present

## 2018-01-31 DIAGNOSIS — F5101 Primary insomnia: Secondary | ICD-10-CM | POA: Diagnosis not present

## 2018-01-31 DIAGNOSIS — N2581 Secondary hyperparathyroidism of renal origin: Secondary | ICD-10-CM | POA: Diagnosis not present

## 2018-01-31 DIAGNOSIS — F331 Major depressive disorder, recurrent, moderate: Secondary | ICD-10-CM | POA: Diagnosis not present

## 2018-01-31 DIAGNOSIS — Z992 Dependence on renal dialysis: Secondary | ICD-10-CM | POA: Diagnosis not present

## 2018-01-31 DIAGNOSIS — D472 Monoclonal gammopathy: Secondary | ICD-10-CM | POA: Diagnosis not present

## 2018-01-31 DIAGNOSIS — E611 Iron deficiency: Secondary | ICD-10-CM | POA: Diagnosis not present

## 2018-01-31 DIAGNOSIS — N186 End stage renal disease: Secondary | ICD-10-CM | POA: Diagnosis not present

## 2018-01-31 DIAGNOSIS — F411 Generalized anxiety disorder: Secondary | ICD-10-CM | POA: Diagnosis not present

## 2018-01-31 DIAGNOSIS — D631 Anemia in chronic kidney disease: Secondary | ICD-10-CM | POA: Diagnosis not present

## 2018-02-02 DIAGNOSIS — N2581 Secondary hyperparathyroidism of renal origin: Secondary | ICD-10-CM | POA: Diagnosis not present

## 2018-02-02 DIAGNOSIS — N186 End stage renal disease: Secondary | ICD-10-CM | POA: Diagnosis not present

## 2018-02-02 DIAGNOSIS — E611 Iron deficiency: Secondary | ICD-10-CM | POA: Diagnosis not present

## 2018-02-02 DIAGNOSIS — Z992 Dependence on renal dialysis: Secondary | ICD-10-CM | POA: Diagnosis not present

## 2018-02-02 DIAGNOSIS — D472 Monoclonal gammopathy: Secondary | ICD-10-CM | POA: Diagnosis not present

## 2018-02-02 DIAGNOSIS — D631 Anemia in chronic kidney disease: Secondary | ICD-10-CM | POA: Diagnosis not present

## 2018-02-03 DIAGNOSIS — R112 Nausea with vomiting, unspecified: Secondary | ICD-10-CM | POA: Diagnosis not present

## 2018-02-03 DIAGNOSIS — D631 Anemia in chronic kidney disease: Secondary | ICD-10-CM | POA: Diagnosis not present

## 2018-02-03 DIAGNOSIS — I480 Paroxysmal atrial fibrillation: Secondary | ICD-10-CM | POA: Diagnosis not present

## 2018-02-03 DIAGNOSIS — K210 Gastro-esophageal reflux disease with esophagitis: Secondary | ICD-10-CM | POA: Diagnosis not present

## 2018-02-04 DIAGNOSIS — D631 Anemia in chronic kidney disease: Secondary | ICD-10-CM | POA: Diagnosis not present

## 2018-02-04 DIAGNOSIS — E611 Iron deficiency: Secondary | ICD-10-CM | POA: Diagnosis not present

## 2018-02-04 DIAGNOSIS — D472 Monoclonal gammopathy: Secondary | ICD-10-CM | POA: Diagnosis not present

## 2018-02-04 DIAGNOSIS — N186 End stage renal disease: Secondary | ICD-10-CM | POA: Diagnosis not present

## 2018-02-04 DIAGNOSIS — N2581 Secondary hyperparathyroidism of renal origin: Secondary | ICD-10-CM | POA: Diagnosis not present

## 2018-02-04 DIAGNOSIS — Z992 Dependence on renal dialysis: Secondary | ICD-10-CM | POA: Diagnosis not present

## 2018-02-07 DIAGNOSIS — N186 End stage renal disease: Secondary | ICD-10-CM | POA: Diagnosis not present

## 2018-02-07 DIAGNOSIS — D472 Monoclonal gammopathy: Secondary | ICD-10-CM | POA: Diagnosis not present

## 2018-02-07 DIAGNOSIS — D631 Anemia in chronic kidney disease: Secondary | ICD-10-CM | POA: Diagnosis not present

## 2018-02-07 DIAGNOSIS — E611 Iron deficiency: Secondary | ICD-10-CM | POA: Diagnosis not present

## 2018-02-07 DIAGNOSIS — N2581 Secondary hyperparathyroidism of renal origin: Secondary | ICD-10-CM | POA: Diagnosis not present

## 2018-02-07 DIAGNOSIS — Z992 Dependence on renal dialysis: Secondary | ICD-10-CM | POA: Diagnosis not present

## 2018-02-09 DIAGNOSIS — N2581 Secondary hyperparathyroidism of renal origin: Secondary | ICD-10-CM | POA: Diagnosis not present

## 2018-02-09 DIAGNOSIS — E611 Iron deficiency: Secondary | ICD-10-CM | POA: Diagnosis not present

## 2018-02-09 DIAGNOSIS — D631 Anemia in chronic kidney disease: Secondary | ICD-10-CM | POA: Diagnosis not present

## 2018-02-09 DIAGNOSIS — Z992 Dependence on renal dialysis: Secondary | ICD-10-CM | POA: Diagnosis not present

## 2018-02-09 DIAGNOSIS — D472 Monoclonal gammopathy: Secondary | ICD-10-CM | POA: Diagnosis not present

## 2018-02-09 DIAGNOSIS — N186 End stage renal disease: Secondary | ICD-10-CM | POA: Diagnosis not present

## 2018-02-10 DIAGNOSIS — Z79899 Other long term (current) drug therapy: Secondary | ICD-10-CM | POA: Diagnosis not present

## 2018-02-11 DIAGNOSIS — N2581 Secondary hyperparathyroidism of renal origin: Secondary | ICD-10-CM | POA: Diagnosis not present

## 2018-02-11 DIAGNOSIS — D631 Anemia in chronic kidney disease: Secondary | ICD-10-CM | POA: Diagnosis not present

## 2018-02-11 DIAGNOSIS — Z992 Dependence on renal dialysis: Secondary | ICD-10-CM | POA: Diagnosis not present

## 2018-02-11 DIAGNOSIS — D472 Monoclonal gammopathy: Secondary | ICD-10-CM | POA: Diagnosis not present

## 2018-02-11 DIAGNOSIS — E611 Iron deficiency: Secondary | ICD-10-CM | POA: Diagnosis not present

## 2018-02-11 DIAGNOSIS — N186 End stage renal disease: Secondary | ICD-10-CM | POA: Diagnosis not present

## 2018-02-14 DIAGNOSIS — E611 Iron deficiency: Secondary | ICD-10-CM | POA: Diagnosis not present

## 2018-02-14 DIAGNOSIS — K210 Gastro-esophageal reflux disease with esophagitis: Secondary | ICD-10-CM | POA: Diagnosis not present

## 2018-02-14 DIAGNOSIS — D472 Monoclonal gammopathy: Secondary | ICD-10-CM | POA: Diagnosis not present

## 2018-02-14 DIAGNOSIS — Z992 Dependence on renal dialysis: Secondary | ICD-10-CM | POA: Diagnosis not present

## 2018-02-14 DIAGNOSIS — K219 Gastro-esophageal reflux disease without esophagitis: Secondary | ICD-10-CM | POA: Diagnosis not present

## 2018-02-14 DIAGNOSIS — F339 Major depressive disorder, recurrent, unspecified: Secondary | ICD-10-CM | POA: Diagnosis not present

## 2018-02-14 DIAGNOSIS — N2581 Secondary hyperparathyroidism of renal origin: Secondary | ICD-10-CM | POA: Diagnosis not present

## 2018-02-14 DIAGNOSIS — N186 End stage renal disease: Secondary | ICD-10-CM | POA: Diagnosis not present

## 2018-02-14 DIAGNOSIS — D631 Anemia in chronic kidney disease: Secondary | ICD-10-CM | POA: Diagnosis not present

## 2018-02-14 DIAGNOSIS — I10 Essential (primary) hypertension: Secondary | ICD-10-CM | POA: Diagnosis not present

## 2018-02-20 DIAGNOSIS — D631 Anemia in chronic kidney disease: Secondary | ICD-10-CM | POA: Diagnosis not present

## 2018-02-20 DIAGNOSIS — Z992 Dependence on renal dialysis: Secondary | ICD-10-CM | POA: Diagnosis not present

## 2018-02-20 DIAGNOSIS — N2581 Secondary hyperparathyroidism of renal origin: Secondary | ICD-10-CM | POA: Diagnosis not present

## 2018-02-20 DIAGNOSIS — N186 End stage renal disease: Secondary | ICD-10-CM | POA: Diagnosis not present

## 2018-02-20 DIAGNOSIS — E611 Iron deficiency: Secondary | ICD-10-CM | POA: Diagnosis not present

## 2018-02-20 DIAGNOSIS — D472 Monoclonal gammopathy: Secondary | ICD-10-CM | POA: Diagnosis not present

## 2018-02-21 DIAGNOSIS — N186 End stage renal disease: Secondary | ICD-10-CM | POA: Diagnosis not present

## 2018-02-21 DIAGNOSIS — D472 Monoclonal gammopathy: Secondary | ICD-10-CM | POA: Diagnosis not present

## 2018-02-21 DIAGNOSIS — D631 Anemia in chronic kidney disease: Secondary | ICD-10-CM | POA: Diagnosis not present

## 2018-02-21 DIAGNOSIS — Z992 Dependence on renal dialysis: Secondary | ICD-10-CM | POA: Diagnosis not present

## 2018-02-21 DIAGNOSIS — N2581 Secondary hyperparathyroidism of renal origin: Secondary | ICD-10-CM | POA: Diagnosis not present

## 2018-02-21 DIAGNOSIS — E611 Iron deficiency: Secondary | ICD-10-CM | POA: Diagnosis not present

## 2018-02-25 DIAGNOSIS — D631 Anemia in chronic kidney disease: Secondary | ICD-10-CM | POA: Diagnosis not present

## 2018-02-25 DIAGNOSIS — E611 Iron deficiency: Secondary | ICD-10-CM | POA: Diagnosis not present

## 2018-02-25 DIAGNOSIS — N2581 Secondary hyperparathyroidism of renal origin: Secondary | ICD-10-CM | POA: Diagnosis not present

## 2018-02-25 DIAGNOSIS — Z992 Dependence on renal dialysis: Secondary | ICD-10-CM | POA: Diagnosis not present

## 2018-02-25 DIAGNOSIS — D472 Monoclonal gammopathy: Secondary | ICD-10-CM | POA: Diagnosis not present

## 2018-02-25 DIAGNOSIS — N186 End stage renal disease: Secondary | ICD-10-CM | POA: Diagnosis not present

## 2018-02-26 DIAGNOSIS — N2581 Secondary hyperparathyroidism of renal origin: Secondary | ICD-10-CM | POA: Diagnosis not present

## 2018-02-26 DIAGNOSIS — N186 End stage renal disease: Secondary | ICD-10-CM | POA: Diagnosis not present

## 2018-02-26 DIAGNOSIS — E611 Iron deficiency: Secondary | ICD-10-CM | POA: Diagnosis not present

## 2018-02-26 DIAGNOSIS — D631 Anemia in chronic kidney disease: Secondary | ICD-10-CM | POA: Diagnosis not present

## 2018-02-26 DIAGNOSIS — Z992 Dependence on renal dialysis: Secondary | ICD-10-CM | POA: Diagnosis not present

## 2018-02-26 DIAGNOSIS — D472 Monoclonal gammopathy: Secondary | ICD-10-CM | POA: Diagnosis not present

## 2018-02-28 DIAGNOSIS — E611 Iron deficiency: Secondary | ICD-10-CM | POA: Diagnosis not present

## 2018-02-28 DIAGNOSIS — Z992 Dependence on renal dialysis: Secondary | ICD-10-CM | POA: Diagnosis not present

## 2018-02-28 DIAGNOSIS — N186 End stage renal disease: Secondary | ICD-10-CM | POA: Diagnosis not present

## 2018-02-28 DIAGNOSIS — N2581 Secondary hyperparathyroidism of renal origin: Secondary | ICD-10-CM | POA: Diagnosis not present

## 2018-02-28 DIAGNOSIS — D472 Monoclonal gammopathy: Secondary | ICD-10-CM | POA: Diagnosis not present

## 2018-02-28 DIAGNOSIS — D631 Anemia in chronic kidney disease: Secondary | ICD-10-CM | POA: Diagnosis not present

## 2018-03-01 ENCOUNTER — Other Ambulatory Visit: Payer: Self-pay

## 2018-03-01 ENCOUNTER — Encounter: Payer: Self-pay | Admitting: Family

## 2018-03-01 ENCOUNTER — Ambulatory Visit (INDEPENDENT_AMBULATORY_CARE_PROVIDER_SITE_OTHER): Payer: Medicare Other | Admitting: Family

## 2018-03-01 ENCOUNTER — Other Ambulatory Visit: Payer: Self-pay | Admitting: *Deleted

## 2018-03-01 ENCOUNTER — Encounter: Payer: Self-pay | Admitting: *Deleted

## 2018-03-01 VITALS — BP 153/95 | HR 64 | Temp 98.7°F | Resp 18 | Ht 59.0 in | Wt 107.6 lb

## 2018-03-01 DIAGNOSIS — I770 Arteriovenous fistula, acquired: Secondary | ICD-10-CM

## 2018-03-01 DIAGNOSIS — N186 End stage renal disease: Secondary | ICD-10-CM

## 2018-03-01 DIAGNOSIS — M7989 Other specified soft tissue disorders: Secondary | ICD-10-CM | POA: Diagnosis not present

## 2018-03-01 DIAGNOSIS — Z992 Dependence on renal dialysis: Secondary | ICD-10-CM | POA: Diagnosis not present

## 2018-03-01 NOTE — Progress Notes (Signed)
CC: right upper extremity swelling x 2-3 weeks, Established Dialysis Access   History of Present Illness  [name] is a 67 y.o. (10-13-1950) female who is s/p US guided cannulation of right upper arm av fistula and drug coated balloon angioplasty of right innominate and subclavian veins with 17mm lutonix on 03-29-17 by Dr. Donzetta Matters. She has ESRD with a history of a right upper extremity AV fistula that required complex balloon angioplasty in February 2019.  She had swelling of her right arm for 3 weeks with apparent collaterals on her chest and arm although the fistula was still working.   Findings: She had a 95% stenosis at the right innominate vein and also stenosis back into the subclavian vein with significant reflux of contrast into the right IJ.  At completion we had a 12 mm drug-coated balloon angioplasty there was still some residual stenosis at the level of the first rib and some residual reflux of contrast into the right IJ but contrast flowed much much more briskly into the right atrium.  There is also a strong thrill in the right forearm at completion.  She is also s/p balloon angioplasty of her right subclavian/innominate veins for an occlusion on 2-16- 2018 by Dr. Donzetta Matters. At that time she had very severe swelling of her right upper extremity which subsequently resolved. At same time a right femoral tunneled dialysis catheter was placed which she had been using for dialysis on M-W-F at [facility] in [place], until the catheter was removed. Her right upper and lower arm AV fistulas are used for HD access.    She is also s/p right AVF fistulogram and debridement of ulcer by Dr. Bridgett Larsson on 03-26-14.   Pt returns today with c/o  2-3 week hx of right arm swelling, numb feeling in right hand.  She states she has had HD accesses in right thigh, left upper arm, and now right forearm and right upper arm.    Past Medical History:  Diagnosis Date  . Anemia   . Anxiety   . Atrial flutter  ([place]) 02/22/2015  . C. difficile colitis 10/10/11  . Chronic diarrhea   . Depression   . ESRD on hemodialysis ([place])   . GERD (gastroesophageal reflux disease)   . HIV (human immunodeficiency virus infection) ([place])   . Hypertension   . Insomnia   . Intracranial hemorrhage ([place])   . Muscle weakness (generalized)   . Pulmonary HTN ([place])   . Tachycardia   . TIA (transient ischemic attack)   . Traumatic hematoma of right upper arm 02/22/2015    Social History Social History   Tobacco Use  . Smoking status: Former Smoker    Last attempt to quit: 03/12/1983    Years since quitting: 34.9  . Smokeless tobacco: Never Used  Substance Use Topics  . Alcohol use: No    Alcohol/week: 0.0 standard drinks  . Drug use: No    Family History Family History  Problem Relation Age of Onset  . Anesthesia problems Neg Hx   . Hypotension Neg Hx   . Malignant hyperthermia Neg Hx   . Pseudochol deficiency Neg Hx     Surgical History Past Surgical History:  Procedure Laterality Date  . A/V FISTULAGRAM N/A 04/17/2016   Procedure: A/V Fistulagram - Right Upper;  Surgeon: Waynetta Sandy, MD;  Location: [facility];  Service: Cardiovascular;  Laterality: N/A;  . BIOPSY THYROID    . Dialysis Shunts     previous  one removed from left arm and now present one in right arm  . DIALYSIS/PERMA CATHETER INSERTION Right 04/17/2016   Procedure: dialysis Catheter Insertion central veinous;  Surgeon: Waynetta Sandy, MD;  Location: [facility] [facility];  Service: Cardiovascular;  Laterality: Right;  . ESOPHAGOGASTRODUODENOSCOPY  12/15/2010   Procedure: ESOPHAGOGASTRODUODENOSCOPY (EGD);  Surgeon: Daneil Dolin, MD;  [facility];  Service: Endoscopy;  Laterality: N/A;  . EXCISION OF BREAST BIOPSY Right 05/04/2012   Procedure: EXCISION OF BREAST BIOPSY;  Surgeon: Donato Heinz, MD;  Location: [facility];  Service: General;  Laterality: Right;  Right Excisional Breast Biopsy  .  FISTULOGRAM Right 03/26/2014   Procedure: Right Arm Fistulogram with Venoplasty Right Subclavian Vein and Inominate Vein. Debridement Fistula Ulcer;  Surgeon: Conrad Hempstead, MD;  Location: [facility];  Service: Vascular;  Laterality: Right;  . FISTULOGRAM Right 03/29/2017   Procedure: FISTULOGRAM COMPLEX RIGHT ARM with Balloon angioplasty;  Surgeon: Waynetta Sandy, MD;  Location: [facility];  Service: Vascular;  Laterality: Right;  . IR GENERIC HISTORICAL  05/19/2016   IR REMOVAL TUN CV CATH W/O FL 05/19/2016 Markus Daft, MD [facility]-INTER[facility] RAD  . PERIPHERAL VASCULAR BALLOON ANGIOPLASTY Right 04/17/2016   Procedure: Peripheral Vascular Balloon Angioplasty;  Surgeon: Waynetta Sandy, MD;  Location: [facility];  Service: Cardiovascular;  Laterality: Right;  Central segment AV Fistula  . SHUNTOGRAM Right 10/19/2013   Procedure: FISTULOGRAM;  Surgeon: Conrad , MD;  Location: [facility] CATH LAB;  Service: Cardiovascular;  Laterality: Right;  . TONSILLECTOMY      Allergies  Allergen Reactions  . Lactose Intolerance (Gi) Other (See Comments)    On MAR  . Latex Rash and Itching  . Penicillins Rash and Swelling    Has patient had a PCN reaction causing immediate rash, facial/tongue/throat swelling, SOB or lightheadedness with hypotension: No Has patient had a PCN reaction causing severe rash involving mucus membranes or skin necrosis: No Has patient had a PCN reaction that required hospitalization No Has patient had a PCN reaction occurring within the last 10 years: No If all of the above answers are "NO", then may proceed with Cephalosporin use.      Current Outpatient Medications  Medication Sig Dispense Refill  . albuterol (PROVENTIL HFA;VENTOLIN HFA) 108 (90 Base) MCG/ACT inhaler Inhale 2 puffs into the lungs every 4 (four) hours as needed for wheezing or shortness of breath.    . bisacodyl (DULCOLAX) 5 MG EC tablet Take 5 mg by mouth daily as needed for moderate constipation.    Marland Kitchen  buPROPion (WELLBUTRIN SR) 150 MG 12 hr tablet Take 150 mg by mouth every morning.    . cinacalcet (SENSIPAR) 30 MG tablet Take 30 mg by mouth daily.    . clopidogrel (PLAVIX) 75 MG tablet Take 1 tablet (75 mg total) by mouth daily. 30 tablet 3  . cyclobenzaprine (FLEXERIL) 5 MG tablet Take 5-10 mg by mouth every 12 (twelve) hours as needed for muscle spasms.     Marland Kitchen diltiazem (CARDIZEM) 60 MG tablet Take 60 mg by mouth 2 (two) times daily.    . dolutegravir (TIVICAY) 50 MG tablet Take 50 mg by mouth daily.    . fentaNYL (DURAGESIC - DOSED MCG/HR) 50 MCG/HR Apply one patch every 72 hours for pain. Remove old patch. External use only. Rotate sites. 10 patch 0  . lamivudine (EPIVIR) 100 MG tablet Take 50 mg by mouth daily.    Marland Kitchen lidocaine-prilocaine (EMLA) cream Apply 1  application topically 3 (three) times a week. Apply 1 hour prior to dialysis access site on Monday Wednesday and Friday    . Melatonin 3 MG TABS Take 3 mg by mouth at bedtime.    . metoprolol (LOPRESSOR) 50 MG tablet Take 50 mg by mouth 2 (two) times daily.    . multivitamin (RENA-VIT) TABS tablet Take 1 tablet by mouth at bedtime.    Marland Kitchen oxyCODONE (OXY IR/ROXICODONE) 5 MG immediate release tablet Take one tablet by mouth every 6 hours as needed for moderate pain (Patient taking differently: Take 10 mg by mouth every 12 (twelve) hours as needed for moderate pain. ) 120 tablet 0  . pantoprazole (PROTONIX) 40 MG tablet Take 40 mg by mouth daily.    . promethazine (PHENERGAN) 12.5 MG tablet Take 12.5 mg by mouth every Monday, Wednesday, and Friday with hemodialysis.     Marland Kitchen promethazine (PHENERGAN) 12.5 MG tablet Take 12.5 mg by mouth every 6 (six) hours as needed for nausea or vomiting.    . sertraline (ZOLOFT) 100 MG tablet Take 150 mg by mouth daily.     . sevelamer carbonate (RENVELA) 800 MG tablet Take 2,400 mg by mouth 3 (three) times daily with meals.     . sildenafil (REVATIO) 20 MG tablet Take 20 mg by mouth 3 (three) times daily.       . vitamin C (ASCORBIC ACID) 500 MG tablet Take 500 mg by mouth 2 (two) times daily.    . zaleplon (SONATA) 5 MG capsule Take one capsule by mouth every night at bedtime for rest (Patient taking differently: Take 10 mg by mouth at bedtime. ) 30 capsule 0  . zidovudine (RETROVIR) 100 MG capsule Take 100 mg by mouth 3 (three) times daily.       Current Facility-Administered Medications  Medication Dose Route Frequency Provider Last Rate Last Dose  . clopidogrel (PLAVIX) tablet 300 mg  300 mg Oral Once Waynetta Sandy, MD         REVIEW OF SYSTEMS: see HPI for pertinent positives and negatives    PHYSICAL EXAMINATION:  Vitals:   03/01/18 1133  BP: (!) 153/95  Pulse: 64  Resp: 18  Temp: 98.7 F (37.1 C)  TempSrc: Oral  SpO2: 99%  Weight: 107 lb 9.6 oz (48.8 kg)  Height: 4\' 11"  (1.499 m)   Body mass index is 21.73 kg/m.  General: The patient appears her stated age.   HEENT:  No gross abnormalities Pulmonary: Respirations are non-labored, CTAB, no rales, rhonchi, or wheezes.  Abdomen: Soft and non-tender with with normal bowel sounds Musculoskeletal: There are no major deformities. She is seated in a w/c, states her legs "give out". M/S 4/5 right LE, 5/5 left LE. Bilateral hand grasp: 3/ 5 right, 4/5 left.   Neurologic: No focal weakness or paresthesias are detected. See above for muscle strength.  Skin: There are no ulcer or rashes noted. Psychiatric: The patient has normal affect. Cardiovascular: There is a regular rate and rhythm without significant murmur appreciated.  Bilateral radial pulses are 1+ palpable. Right upper arm and forearm are slightly larger than left, does not seem edematous. Prominent veins on right side of chest, more so than left side of chest.  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 Right upper arm AVF with less audible bruit.  Left upper arm AVF with hard non functioning aneurysmal  AVF, no bruit, no thrill.    Medical Decision  Making  ISMELDA WEATHERMAN is a 67 y.o. female who presents with swelling of her right arm x 2-3 weeks, prominent superficial veins at the right side of her chest.  She has a right forearm and right upper arm AV fistula that are used for HD, and are aneurysmal.  Left upper arm aneurysmal AVF is no longer functional.   I discussed with Dr. Donnetta Hutching pt HPI and physical exam results.   Will schedule for right central venogram, possible intervention, for ASAP with Dr. Donzetta Matters.    Clemon Chambers, RN, MSN, FNP-C [facility] Office: [phone]  03/01/2018, 11:43 AM  Clinic MD: Early

## 2018-03-02 DIAGNOSIS — D631 Anemia in chronic kidney disease: Secondary | ICD-10-CM | POA: Diagnosis not present

## 2018-03-02 DIAGNOSIS — N186 End stage renal disease: Secondary | ICD-10-CM | POA: Diagnosis not present

## 2018-03-02 DIAGNOSIS — Z992 Dependence on renal dialysis: Secondary | ICD-10-CM | POA: Diagnosis not present

## 2018-03-02 DIAGNOSIS — D472 Monoclonal gammopathy: Secondary | ICD-10-CM | POA: Diagnosis not present

## 2018-03-02 DIAGNOSIS — N2581 Secondary hyperparathyroidism of renal origin: Secondary | ICD-10-CM | POA: Diagnosis not present

## 2018-03-03 ENCOUNTER — Ambulatory Visit: Payer: Medicare Other | Admitting: Family

## 2018-03-07 DIAGNOSIS — D631 Anemia in chronic kidney disease: Secondary | ICD-10-CM | POA: Diagnosis not present

## 2018-03-07 DIAGNOSIS — Z992 Dependence on renal dialysis: Secondary | ICD-10-CM | POA: Diagnosis not present

## 2018-03-07 DIAGNOSIS — D472 Monoclonal gammopathy: Secondary | ICD-10-CM | POA: Diagnosis not present

## 2018-03-07 DIAGNOSIS — N2581 Secondary hyperparathyroidism of renal origin: Secondary | ICD-10-CM | POA: Diagnosis not present

## 2018-03-07 DIAGNOSIS — N186 End stage renal disease: Secondary | ICD-10-CM | POA: Diagnosis not present

## 2018-03-09 DIAGNOSIS — N186 End stage renal disease: Secondary | ICD-10-CM | POA: Diagnosis not present

## 2018-03-09 DIAGNOSIS — Z992 Dependence on renal dialysis: Secondary | ICD-10-CM | POA: Diagnosis not present

## 2018-03-09 DIAGNOSIS — D472 Monoclonal gammopathy: Secondary | ICD-10-CM | POA: Diagnosis not present

## 2018-03-09 DIAGNOSIS — D631 Anemia in chronic kidney disease: Secondary | ICD-10-CM | POA: Diagnosis not present

## 2018-03-09 DIAGNOSIS — N2581 Secondary hyperparathyroidism of renal origin: Secondary | ICD-10-CM | POA: Diagnosis not present

## 2018-03-14 ENCOUNTER — Ambulatory Visit (HOSPITAL_COMMUNITY)
Admission: RE | Admit: 2018-03-14 | Discharge: 2018-03-14 | Disposition: A | Discharge status: Home / Self Care | Payer: Medicare Other | Attending: Vascular Surgery | Admitting: Vascular Surgery

## 2018-03-14 ENCOUNTER — Encounter (HOSPITAL_COMMUNITY)
Admission: RE | Disposition: A | Discharge status: Home / Self Care | Payer: Self-pay | Source: Home / Self Care | Attending: Vascular Surgery

## 2018-03-14 ENCOUNTER — Other Ambulatory Visit: Payer: Self-pay

## 2018-03-14 ENCOUNTER — Encounter (HOSPITAL_COMMUNITY): Payer: Self-pay | Admitting: Vascular Surgery

## 2018-03-14 DIAGNOSIS — Z79899 Other long term (current) drug therapy: Secondary | ICD-10-CM | POA: Insufficient documentation

## 2018-03-14 DIAGNOSIS — I120 Hypertensive chronic kidney disease with stage 5 chronic kidney disease or end stage renal disease: Secondary | ICD-10-CM | POA: Diagnosis not present

## 2018-03-14 DIAGNOSIS — B20 Human immunodeficiency virus [HIV] disease: Secondary | ICD-10-CM | POA: Insufficient documentation

## 2018-03-14 DIAGNOSIS — Z7902 Long term (current) use of antithrombotics/antiplatelets: Secondary | ICD-10-CM | POA: Insufficient documentation

## 2018-03-14 DIAGNOSIS — Z8673 Personal history of transient ischemic attack (TIA), and cerebral infarction without residual deficits: Secondary | ICD-10-CM | POA: Diagnosis not present

## 2018-03-14 DIAGNOSIS — T82858A Stenosis of vascular prosthetic devices, implants and grafts, initial encounter: Secondary | ICD-10-CM | POA: Insufficient documentation

## 2018-03-14 DIAGNOSIS — Z992 Dependence on renal dialysis: Secondary | ICD-10-CM | POA: Insufficient documentation

## 2018-03-14 DIAGNOSIS — N186 End stage renal disease: Secondary | ICD-10-CM | POA: Diagnosis not present

## 2018-03-14 DIAGNOSIS — Z955 Presence of coronary angioplasty implant and graft: Secondary | ICD-10-CM | POA: Diagnosis not present

## 2018-03-14 DIAGNOSIS — Z87891 Personal history of nicotine dependence: Secondary | ICD-10-CM | POA: Insufficient documentation

## 2018-03-14 DIAGNOSIS — I4892 Unspecified atrial flutter: Secondary | ICD-10-CM | POA: Insufficient documentation

## 2018-03-14 DIAGNOSIS — G4700 Insomnia, unspecified: Secondary | ICD-10-CM | POA: Insufficient documentation

## 2018-03-14 DIAGNOSIS — I4891 Unspecified atrial fibrillation: Secondary | ICD-10-CM | POA: Diagnosis not present

## 2018-03-14 DIAGNOSIS — Z91040 Latex allergy status: Secondary | ICD-10-CM | POA: Diagnosis not present

## 2018-03-14 DIAGNOSIS — K219 Gastro-esophageal reflux disease without esophagitis: Secondary | ICD-10-CM | POA: Insufficient documentation

## 2018-03-14 DIAGNOSIS — Z880 Allergy status to penicillin: Secondary | ICD-10-CM | POA: Insufficient documentation

## 2018-03-14 DIAGNOSIS — I272 Pulmonary hypertension, unspecified: Secondary | ICD-10-CM | POA: Diagnosis not present

## 2018-03-14 DIAGNOSIS — Y832 Surgical operation with anastomosis, bypass or graft as the cause of abnormal reaction of the patient, or of later complication, without mention of misadventure at the time of the procedure: Secondary | ICD-10-CM | POA: Diagnosis not present

## 2018-03-14 DIAGNOSIS — T82898A Other specified complication of vascular prosthetic devices, implants and grafts, initial encounter: Secondary | ICD-10-CM | POA: Diagnosis not present

## 2018-03-14 HISTORY — PX: VISCERAL VENOGRAPHY: CATH118283

## 2018-03-14 HISTORY — PX: PERIPHERAL VASCULAR BALLOON ANGIOPLASTY: CATH118281

## 2018-03-14 HISTORY — PX: PERIPHERAL VASCULAR INTERVENTION: CATH118257

## 2018-03-14 LAB — POCT I-STAT, CHEM 8
BUN: 52 mg/dL — ABNORMAL HIGH (ref 8–23)
Calcium, Ion: 0.91 mmol/L — ABNORMAL LOW (ref 1.15–1.40)
Chloride: 104 mmol/L (ref 98–111)
Creatinine, Ser: 14.7 mg/dL — ABNORMAL HIGH (ref 0.44–1.00)
Glucose, Bld: 73 mg/dL (ref 70–99)
HCT: 28 % — ABNORMAL LOW (ref 36.0–46.0)
Hemoglobin: 9.5 g/dL — ABNORMAL LOW (ref 12.0–15.0)
Potassium: 4.4 mmol/L (ref 3.5–5.1)
Sodium: 136 mmol/L (ref 135–145)
TCO2: 23 mmol/L (ref 22–32)

## 2018-03-14 SURGERY — VISCERAL VENOGRAPHY
Anesthesia: LOCAL | Laterality: Right

## 2018-03-14 MED ORDER — LIDOCAINE HCL (PF) 1 % IJ SOLN
INTRAMUSCULAR | Status: AC
  Filled 2018-03-14: qty 30

## 2018-03-14 MED ORDER — ONDANSETRON HCL 4 MG/2ML IJ SOLN
INTRAMUSCULAR | Status: AC
  Filled 2018-03-14: qty 2

## 2018-03-14 MED ORDER — HEPARIN (PORCINE) IN NACL 1000-0.9 UT/500ML-% IV SOLN
INTRAVENOUS | Status: AC
  Filled 2018-03-14: qty 1000

## 2018-03-14 MED ORDER — HEPARIN (PORCINE) IN NACL 1000-0.9 UT/500ML-% IV SOLN
INTRAVENOUS | Status: DC | PRN
  Administered 2018-03-14: 500 mL

## 2018-03-14 MED ORDER — IODIXANOL 320 MG/ML IV SOLN
INTRAVENOUS | Status: DC | PRN
  Administered 2018-03-14: 60 mL via INTRAVENOUS

## 2018-03-14 MED ORDER — SODIUM CHLORIDE 0.9% FLUSH: 3.0000 mL | INTRAVENOUS | Status: DC | PRN

## 2018-03-14 MED ORDER — FENTANYL CITRATE (PF) 100 MCG/2ML IJ SOLN
INTRAMUSCULAR | Status: DC | PRN
  Administered 2018-03-14 (×2): 50 ug via INTRAVENOUS

## 2018-03-14 MED ORDER — LIDOCAINE HCL (PF) 1 % IJ SOLN
INTRAMUSCULAR | Status: DC | PRN
  Administered 2018-03-14: 5 mL

## 2018-03-14 MED ORDER — FENTANYL CITRATE (PF) 100 MCG/2ML IJ SOLN
INTRAMUSCULAR | Status: AC
  Filled 2018-03-14: qty 2

## 2018-03-14 MED ORDER — ONDANSETRON HCL 4 MG/2ML IJ SOLN
4.0000 mg | Freq: Once | INTRAMUSCULAR | Status: AC
  Administered 2018-03-14: 4 mg via INTRAVENOUS

## 2018-03-14 SURGICAL SUPPLY — 20 items
BALLN LUTONIX AV 12X40X75 (BALLOONS) ×2
BALLN MUSTANG 10.0X40 75 (BALLOONS) ×2
BALLN MUSTANG 12.0X40 75 (BALLOONS) ×2
BALLN MUSTANG 8.0X40 75 (BALLOONS) ×2
BALLOON LUTONIX AV 12X40X75 (BALLOONS) ×1 IMPLANT
BALLOON MUSTANG 10.0X40 75 (BALLOONS) ×1 IMPLANT
BALLOON MUSTANG 12.0X40 75 (BALLOONS) ×1 IMPLANT
BALLOON MUSTANG 8.0X40 75 (BALLOONS) ×1 IMPLANT
CATH ANGIO 5F BER2 65CM (CATHETERS) ×2 IMPLANT
DEVICE TORQUE .025-.038 (MISCELLANEOUS) ×2 IMPLANT
GLIDEWIRE ADV .035X180CM (WIRE) ×2 IMPLANT
KIT ENCORE 26 ADVANTAGE (KITS) ×2 IMPLANT
KIT MICROPUNCTURE NIT STIFF (SHEATH) ×2 IMPLANT
KIT PV (KITS) ×2 IMPLANT
SHEATH PINNACLE R/O II 7F 4CM (SHEATH) ×2 IMPLANT
SHEATH PROBE COVER 6X72 (BAG) ×2 IMPLANT
STENT OMNILINK ELITE 10X29X80 (Permanent Stent) ×2 IMPLANT
STOPCOCK MORSE 400PSI 3WAY (MISCELLANEOUS) ×2 IMPLANT
TRAY PV CATH (CUSTOM PROCEDURE TRAY) ×2 IMPLANT
TUBING CIL FLEX 10 FLL-RA (TUBING) ×2 IMPLANT

## 2018-03-14 NOTE — H&P (Signed)
   History and Physical Update  The patient was interviewed and re-examined.  The patient's previous History and Physical has been reviewed and is unchanged from recent office visit.   Aeson Sawyers C. Donzetta Matters, MD [facility] Office: [phone] Pager: [phone]  03/14/2018, 12:48 PM

## 2018-03-14 NOTE — Progress Notes (Signed)
Report called to Velna Hatchet, Therapist, sports at [facility]

## 2018-03-14 NOTE — Op Note (Signed)
    Patient name: [name] MRN: [id number] DOB: September 04, 1950 Sex: female  03/14/2018 Pre-operative Diagnosis: End-stage renal disease, malfunction right arm AV fistula Post-operative diagnosis:  Same Surgeon:  Eda Paschal. Donzetta Matters, MD Procedure Performed: 1.  Ultrasound-guided cannulation right arm AV fistula 2.  Right upper extremity and central fistulogram 3.  Stent of right innominate vein with 10 x 29 mm balloon expandable bare-metal stent 4.  Drug-coated balloon angioplasty of right subclavian vein with 12 mm lutonix  Indications: 68 year old female with history of end-stage renal disease.  She has had multiple interventions of her right innominate and subclavian veins.  She now has significant swelling of the right arm and is indicated for intervention.  Findings: Innominate vein was at least 95% stenosed if not occluded.  The subclavian vein had greater than 70% stenosis.  At completion the stent is patent with no residual stenosis in the innominate and subclavian vein stenosis is less than 20%.  There is some residual reflux in the subclavian vein distally blood flow was brisk and there is an increased thrill in the fistula itself.   Procedure:  The patient was identified in the holding area and taken to room 8.  The patient was then placed supine on the table and prepped and draped in the usual sterile fashion.  A time out was called.  Ultrasound was used to evaluate the right arm AV fistula and the area was anesthetized 1% lidocaine.  Ultrasound was used to cannulate this with direct visualization with micropuncture needle followed by wire sheath.  An image was saved the permanent record.  Right upper extremity central venography pictures were performed.  We exchanged for 7 French sheath.  Bare catheter and Glidewire advantage were used to traverse the stenosis and occluded regions and we confirmed intraluminal access.  The wire was then placed into the IVC.  We serially dilated the  innominate and subclavian veins with 8, 10, 12 mm balloons.  We then elected to stent the innominate vein.  A 10 mm balloon expandable stent was as large as we had and this was inflated to nominal pressure.  We then postdilated this with 12 mm balloon.  Completion angios demonstrated persistent residual stenosis at the level of the first rib in the subclavian.  This was then dilated with 12 mm drug-coated balloon.  Completion demonstrated minimal residual stenosis although there was some reflux into the subclavian vein still.  I did not want to stent over a first rib and did not want to cover the IJ spot no stent was placed.  There was a much improved thrill in the fistula itself.  She tolerated this procedure without immediate complication.  Contrast: 60cc    C. Donzetta Matters, MD [facility] Office: [phone] Pager: [phone]

## 2018-03-14 NOTE — Discharge Instructions (Signed)
Venogram, Care After This sheet gives you information about how to care for yourself after your procedure. Your health care provider may also give you more specific instructions. If you have problems or questions, contact your health care provider. What can I expect after the procedure? After the procedure, it is common to have:  Bruising or mild discomfort in the area where the IV was inserted (insertion site). Follow these instructions at home: Eating and drinking   Follow instructions from your health care provider about eating or drinking restrictions.  Drink a lot of fluids for the first several days after the procedure, as directed by your health care provider. This helps to wash (flush) the contrast out of your body. Examples of healthy fluids include water or low-calorie drinks. General instructions  Check your IV insertion area every day for signs of infection. Check for: ? Redness, swelling, or pain. ? Fluid or blood. ? Warmth. ? Pus or a bad smell.  Take over-the-counter and prescription medicines only as told by your health care provider.  Rest and return to your normal activities as told by your health care provider. Ask your health care provider what activities are safe for you.  Do not drive for 24 hours if you were given a medicine to help you relax (sedative), or until your health care provider approves.  Keep all follow-up visits as told by your health care provider. This is important. Contact a health care provider if:  Your skin becomes itchy or you develop a rash or hives.  You have a fever that does not get better with medicine.  You feel nauseous.  You vomit.  You have redness, swelling, or pain around the insertion site.  You have fluid or blood coming from the insertion site.  Your insertion area feels warm to the touch.  You have pus or a bad smell coming from the insertion site. Get help right away if:  You have difficulty breathing or  shortness of breath.  You develop chest pain.  You faint.  You feel very dizzy. These symptoms may represent a serious problem that is an emergency. Do not wait to see if the symptoms will go away. Get medical help right away. Call your local emergency services (911 in the [place] Do not drive yourself to the hospital. Summary  After your procedure, it is common to have bruising or mild discomfort in the area where the IV was inserted.  You should check your IV insertion area every day for signs of infection.  Take over-the-counter and prescription medicines only as told by your health care provider.  You should drink a lot of fluids for the first several days after the procedure to help flush the contrast from your body. This information is not intended to replace advice given to you by your health care provider. Make sure you discuss any questions you have with your health care provider. Document Released: 12/07/2012 Document Revised: 01/11/2016 Document Reviewed: 01/11/2016 Elsevier Interactive Patient Education  2019 [employer].

## 2018-03-14 NOTE — Progress Notes (Signed)
Dr Donzetta Matters called about where to place Iv. States to use left arm.

## 2018-03-15 ENCOUNTER — Encounter (HOSPITAL_COMMUNITY): Payer: Self-pay | Admitting: Vascular Surgery

## 2018-03-16 DIAGNOSIS — Z992 Dependence on renal dialysis: Secondary | ICD-10-CM | POA: Diagnosis not present

## 2018-03-16 DIAGNOSIS — Z79899 Other long term (current) drug therapy: Secondary | ICD-10-CM | POA: Diagnosis not present

## 2018-03-16 DIAGNOSIS — N186 End stage renal disease: Secondary | ICD-10-CM | POA: Diagnosis not present

## 2018-03-16 DIAGNOSIS — D631 Anemia in chronic kidney disease: Secondary | ICD-10-CM | POA: Diagnosis not present

## 2018-03-16 DIAGNOSIS — N2581 Secondary hyperparathyroidism of renal origin: Secondary | ICD-10-CM | POA: Diagnosis not present

## 2018-03-16 DIAGNOSIS — D472 Monoclonal gammopathy: Secondary | ICD-10-CM | POA: Diagnosis not present

## 2018-03-23 DIAGNOSIS — D472 Monoclonal gammopathy: Secondary | ICD-10-CM | POA: Diagnosis not present

## 2018-03-23 DIAGNOSIS — N186 End stage renal disease: Secondary | ICD-10-CM | POA: Diagnosis not present

## 2018-03-23 DIAGNOSIS — Z992 Dependence on renal dialysis: Secondary | ICD-10-CM | POA: Diagnosis not present

## 2018-03-23 DIAGNOSIS — N2581 Secondary hyperparathyroidism of renal origin: Secondary | ICD-10-CM | POA: Diagnosis not present

## 2018-03-23 DIAGNOSIS — D631 Anemia in chronic kidney disease: Secondary | ICD-10-CM | POA: Diagnosis not present

## 2018-03-25 DIAGNOSIS — D472 Monoclonal gammopathy: Secondary | ICD-10-CM | POA: Diagnosis not present

## 2018-03-25 DIAGNOSIS — Z992 Dependence on renal dialysis: Secondary | ICD-10-CM | POA: Diagnosis not present

## 2018-03-25 DIAGNOSIS — D631 Anemia in chronic kidney disease: Secondary | ICD-10-CM | POA: Diagnosis not present

## 2018-03-25 DIAGNOSIS — N2581 Secondary hyperparathyroidism of renal origin: Secondary | ICD-10-CM | POA: Diagnosis not present

## 2018-03-25 DIAGNOSIS — N186 End stage renal disease: Secondary | ICD-10-CM | POA: Diagnosis not present

## 2018-03-28 DIAGNOSIS — N186 End stage renal disease: Secondary | ICD-10-CM | POA: Diagnosis not present

## 2018-03-28 DIAGNOSIS — Z992 Dependence on renal dialysis: Secondary | ICD-10-CM | POA: Diagnosis not present

## 2018-03-28 DIAGNOSIS — D472 Monoclonal gammopathy: Secondary | ICD-10-CM | POA: Diagnosis not present

## 2018-03-28 DIAGNOSIS — D631 Anemia in chronic kidney disease: Secondary | ICD-10-CM | POA: Diagnosis not present

## 2018-03-28 DIAGNOSIS — N2581 Secondary hyperparathyroidism of renal origin: Secondary | ICD-10-CM | POA: Diagnosis not present

## 2018-03-30 DIAGNOSIS — N2581 Secondary hyperparathyroidism of renal origin: Secondary | ICD-10-CM | POA: Diagnosis not present

## 2018-03-30 DIAGNOSIS — D631 Anemia in chronic kidney disease: Secondary | ICD-10-CM | POA: Diagnosis not present

## 2018-03-30 DIAGNOSIS — Z992 Dependence on renal dialysis: Secondary | ICD-10-CM | POA: Diagnosis not present

## 2018-03-30 DIAGNOSIS — D472 Monoclonal gammopathy: Secondary | ICD-10-CM | POA: Diagnosis not present

## 2018-03-30 DIAGNOSIS — N186 End stage renal disease: Secondary | ICD-10-CM | POA: Diagnosis not present

## 2018-04-01 DIAGNOSIS — N186 End stage renal disease: Secondary | ICD-10-CM | POA: Diagnosis not present

## 2018-04-01 DIAGNOSIS — Z992 Dependence on renal dialysis: Secondary | ICD-10-CM | POA: Diagnosis not present

## 2018-04-02 DIAGNOSIS — D472 Monoclonal gammopathy: Secondary | ICD-10-CM | POA: Diagnosis not present

## 2018-04-02 DIAGNOSIS — Z992 Dependence on renal dialysis: Secondary | ICD-10-CM | POA: Diagnosis not present

## 2018-04-02 DIAGNOSIS — N2581 Secondary hyperparathyroidism of renal origin: Secondary | ICD-10-CM | POA: Diagnosis not present

## 2018-04-02 DIAGNOSIS — D631 Anemia in chronic kidney disease: Secondary | ICD-10-CM | POA: Diagnosis not present

## 2018-04-02 DIAGNOSIS — N186 End stage renal disease: Secondary | ICD-10-CM | POA: Diagnosis not present

## 2018-04-02 DIAGNOSIS — E611 Iron deficiency: Secondary | ICD-10-CM | POA: Diagnosis not present

## 2018-04-04 DIAGNOSIS — N2581 Secondary hyperparathyroidism of renal origin: Secondary | ICD-10-CM | POA: Diagnosis not present

## 2018-04-04 DIAGNOSIS — D631 Anemia in chronic kidney disease: Secondary | ICD-10-CM | POA: Diagnosis not present

## 2018-04-04 DIAGNOSIS — N186 End stage renal disease: Secondary | ICD-10-CM | POA: Diagnosis not present

## 2018-04-04 DIAGNOSIS — Z992 Dependence on renal dialysis: Secondary | ICD-10-CM | POA: Diagnosis not present

## 2018-04-04 DIAGNOSIS — D472 Monoclonal gammopathy: Secondary | ICD-10-CM | POA: Diagnosis not present

## 2018-04-04 DIAGNOSIS — E611 Iron deficiency: Secondary | ICD-10-CM | POA: Diagnosis not present

## 2018-04-08 DIAGNOSIS — D472 Monoclonal gammopathy: Secondary | ICD-10-CM | POA: Diagnosis not present

## 2018-04-08 DIAGNOSIS — N186 End stage renal disease: Secondary | ICD-10-CM | POA: Diagnosis not present

## 2018-04-08 DIAGNOSIS — Z992 Dependence on renal dialysis: Secondary | ICD-10-CM | POA: Diagnosis not present

## 2018-04-08 DIAGNOSIS — N2581 Secondary hyperparathyroidism of renal origin: Secondary | ICD-10-CM | POA: Diagnosis not present

## 2018-04-08 DIAGNOSIS — D631 Anemia in chronic kidney disease: Secondary | ICD-10-CM | POA: Diagnosis not present

## 2018-04-08 DIAGNOSIS — E611 Iron deficiency: Secondary | ICD-10-CM | POA: Diagnosis not present

## 2018-04-13 DIAGNOSIS — E611 Iron deficiency: Secondary | ICD-10-CM | POA: Diagnosis not present

## 2018-04-13 DIAGNOSIS — D631 Anemia in chronic kidney disease: Secondary | ICD-10-CM | POA: Diagnosis not present

## 2018-04-13 DIAGNOSIS — D472 Monoclonal gammopathy: Secondary | ICD-10-CM | POA: Diagnosis not present

## 2018-04-13 DIAGNOSIS — Z992 Dependence on renal dialysis: Secondary | ICD-10-CM | POA: Diagnosis not present

## 2018-04-13 DIAGNOSIS — N2581 Secondary hyperparathyroidism of renal origin: Secondary | ICD-10-CM | POA: Diagnosis not present

## 2018-04-13 DIAGNOSIS — N186 End stage renal disease: Secondary | ICD-10-CM | POA: Diagnosis not present

## 2018-04-14 DIAGNOSIS — F5101 Primary insomnia: Secondary | ICD-10-CM | POA: Diagnosis not present

## 2018-04-14 DIAGNOSIS — F411 Generalized anxiety disorder: Secondary | ICD-10-CM | POA: Diagnosis not present

## 2018-04-14 DIAGNOSIS — F331 Major depressive disorder, recurrent, moderate: Secondary | ICD-10-CM | POA: Diagnosis not present

## 2018-04-15 DIAGNOSIS — D472 Monoclonal gammopathy: Secondary | ICD-10-CM | POA: Diagnosis not present

## 2018-04-15 DIAGNOSIS — N186 End stage renal disease: Secondary | ICD-10-CM | POA: Diagnosis not present

## 2018-04-15 DIAGNOSIS — D631 Anemia in chronic kidney disease: Secondary | ICD-10-CM | POA: Diagnosis not present

## 2018-04-15 DIAGNOSIS — N2581 Secondary hyperparathyroidism of renal origin: Secondary | ICD-10-CM | POA: Diagnosis not present

## 2018-04-15 DIAGNOSIS — E611 Iron deficiency: Secondary | ICD-10-CM | POA: Diagnosis not present

## 2018-04-15 DIAGNOSIS — Z992 Dependence on renal dialysis: Secondary | ICD-10-CM | POA: Diagnosis not present

## 2018-04-18 DIAGNOSIS — N2581 Secondary hyperparathyroidism of renal origin: Secondary | ICD-10-CM | POA: Diagnosis not present

## 2018-04-18 DIAGNOSIS — E611 Iron deficiency: Secondary | ICD-10-CM | POA: Diagnosis not present

## 2018-04-18 DIAGNOSIS — N186 End stage renal disease: Secondary | ICD-10-CM | POA: Diagnosis not present

## 2018-04-18 DIAGNOSIS — D631 Anemia in chronic kidney disease: Secondary | ICD-10-CM | POA: Diagnosis not present

## 2018-04-18 DIAGNOSIS — D472 Monoclonal gammopathy: Secondary | ICD-10-CM | POA: Diagnosis not present

## 2018-04-18 DIAGNOSIS — Z992 Dependence on renal dialysis: Secondary | ICD-10-CM | POA: Diagnosis not present

## 2018-04-21 DIAGNOSIS — K210 Gastro-esophageal reflux disease with esophagitis: Secondary | ICD-10-CM | POA: Diagnosis not present

## 2018-04-21 DIAGNOSIS — F339 Major depressive disorder, recurrent, unspecified: Secondary | ICD-10-CM | POA: Diagnosis not present

## 2018-04-21 DIAGNOSIS — N186 End stage renal disease: Secondary | ICD-10-CM | POA: Diagnosis not present

## 2018-04-21 DIAGNOSIS — K219 Gastro-esophageal reflux disease without esophagitis: Secondary | ICD-10-CM | POA: Diagnosis not present

## 2018-04-22 DIAGNOSIS — D631 Anemia in chronic kidney disease: Secondary | ICD-10-CM | POA: Diagnosis not present

## 2018-04-22 DIAGNOSIS — D472 Monoclonal gammopathy: Secondary | ICD-10-CM | POA: Diagnosis not present

## 2018-04-22 DIAGNOSIS — Z992 Dependence on renal dialysis: Secondary | ICD-10-CM | POA: Diagnosis not present

## 2018-04-22 DIAGNOSIS — N186 End stage renal disease: Secondary | ICD-10-CM | POA: Diagnosis not present

## 2018-04-22 DIAGNOSIS — N2581 Secondary hyperparathyroidism of renal origin: Secondary | ICD-10-CM | POA: Diagnosis not present

## 2018-04-22 DIAGNOSIS — E611 Iron deficiency: Secondary | ICD-10-CM | POA: Diagnosis not present

## 2018-04-25 DIAGNOSIS — D631 Anemia in chronic kidney disease: Secondary | ICD-10-CM | POA: Diagnosis not present

## 2018-04-25 DIAGNOSIS — N2581 Secondary hyperparathyroidism of renal origin: Secondary | ICD-10-CM | POA: Diagnosis not present

## 2018-04-25 DIAGNOSIS — D472 Monoclonal gammopathy: Secondary | ICD-10-CM | POA: Diagnosis not present

## 2018-04-25 DIAGNOSIS — Z992 Dependence on renal dialysis: Secondary | ICD-10-CM | POA: Diagnosis not present

## 2018-04-25 DIAGNOSIS — E611 Iron deficiency: Secondary | ICD-10-CM | POA: Diagnosis not present

## 2018-04-25 DIAGNOSIS — N186 End stage renal disease: Secondary | ICD-10-CM | POA: Diagnosis not present

## 2018-04-29 DIAGNOSIS — N2581 Secondary hyperparathyroidism of renal origin: Secondary | ICD-10-CM | POA: Diagnosis not present

## 2018-04-29 DIAGNOSIS — D631 Anemia in chronic kidney disease: Secondary | ICD-10-CM | POA: Diagnosis not present

## 2018-04-29 DIAGNOSIS — N186 End stage renal disease: Secondary | ICD-10-CM | POA: Diagnosis not present

## 2018-04-29 DIAGNOSIS — D472 Monoclonal gammopathy: Secondary | ICD-10-CM | POA: Diagnosis not present

## 2018-04-29 DIAGNOSIS — Z992 Dependence on renal dialysis: Secondary | ICD-10-CM | POA: Diagnosis not present

## 2018-04-29 DIAGNOSIS — E611 Iron deficiency: Secondary | ICD-10-CM | POA: Diagnosis not present

## 2018-04-30 DIAGNOSIS — F331 Major depressive disorder, recurrent, moderate: Secondary | ICD-10-CM | POA: Diagnosis not present

## 2018-04-30 DIAGNOSIS — G301 Alzheimer's disease with late onset: Secondary | ICD-10-CM | POA: Diagnosis not present

## 2018-04-30 DIAGNOSIS — Z992 Dependence on renal dialysis: Secondary | ICD-10-CM | POA: Diagnosis not present

## 2018-04-30 DIAGNOSIS — N186 End stage renal disease: Secondary | ICD-10-CM | POA: Diagnosis not present

## 2018-05-01 DIAGNOSIS — N2581 Secondary hyperparathyroidism of renal origin: Secondary | ICD-10-CM | POA: Diagnosis not present

## 2018-05-01 DIAGNOSIS — D631 Anemia in chronic kidney disease: Secondary | ICD-10-CM | POA: Diagnosis not present

## 2018-05-01 DIAGNOSIS — Z992 Dependence on renal dialysis: Secondary | ICD-10-CM | POA: Diagnosis not present

## 2018-05-01 DIAGNOSIS — E611 Iron deficiency: Secondary | ICD-10-CM | POA: Diagnosis not present

## 2018-05-01 DIAGNOSIS — N186 End stage renal disease: Secondary | ICD-10-CM | POA: Diagnosis not present

## 2018-05-01 DIAGNOSIS — D472 Monoclonal gammopathy: Secondary | ICD-10-CM | POA: Diagnosis not present

## 2018-05-02 DIAGNOSIS — E611 Iron deficiency: Secondary | ICD-10-CM | POA: Diagnosis not present

## 2018-05-02 DIAGNOSIS — N2581 Secondary hyperparathyroidism of renal origin: Secondary | ICD-10-CM | POA: Diagnosis not present

## 2018-05-02 DIAGNOSIS — N186 End stage renal disease: Secondary | ICD-10-CM | POA: Diagnosis not present

## 2018-05-02 DIAGNOSIS — D472 Monoclonal gammopathy: Secondary | ICD-10-CM | POA: Diagnosis not present

## 2018-05-02 DIAGNOSIS — Z992 Dependence on renal dialysis: Secondary | ICD-10-CM | POA: Diagnosis not present

## 2018-05-02 DIAGNOSIS — D631 Anemia in chronic kidney disease: Secondary | ICD-10-CM | POA: Diagnosis not present

## 2018-05-04 DIAGNOSIS — N186 End stage renal disease: Secondary | ICD-10-CM | POA: Diagnosis not present

## 2018-05-04 DIAGNOSIS — E611 Iron deficiency: Secondary | ICD-10-CM | POA: Diagnosis not present

## 2018-05-04 DIAGNOSIS — D472 Monoclonal gammopathy: Secondary | ICD-10-CM | POA: Diagnosis not present

## 2018-05-04 DIAGNOSIS — N2581 Secondary hyperparathyroidism of renal origin: Secondary | ICD-10-CM | POA: Diagnosis not present

## 2018-05-04 DIAGNOSIS — Z992 Dependence on renal dialysis: Secondary | ICD-10-CM | POA: Diagnosis not present

## 2018-05-04 DIAGNOSIS — D631 Anemia in chronic kidney disease: Secondary | ICD-10-CM | POA: Diagnosis not present

## 2018-05-09 DIAGNOSIS — N186 End stage renal disease: Secondary | ICD-10-CM | POA: Diagnosis not present

## 2018-05-09 DIAGNOSIS — N2581 Secondary hyperparathyroidism of renal origin: Secondary | ICD-10-CM | POA: Diagnosis not present

## 2018-05-09 DIAGNOSIS — E611 Iron deficiency: Secondary | ICD-10-CM | POA: Diagnosis not present

## 2018-05-09 DIAGNOSIS — D631 Anemia in chronic kidney disease: Secondary | ICD-10-CM | POA: Diagnosis not present

## 2018-05-09 DIAGNOSIS — Z992 Dependence on renal dialysis: Secondary | ICD-10-CM | POA: Diagnosis not present

## 2018-05-09 DIAGNOSIS — D472 Monoclonal gammopathy: Secondary | ICD-10-CM | POA: Diagnosis not present

## 2018-05-12 DIAGNOSIS — F5101 Primary insomnia: Secondary | ICD-10-CM | POA: Diagnosis not present

## 2018-05-12 DIAGNOSIS — F411 Generalized anxiety disorder: Secondary | ICD-10-CM | POA: Diagnosis not present

## 2018-05-12 DIAGNOSIS — F331 Major depressive disorder, recurrent, moderate: Secondary | ICD-10-CM | POA: Diagnosis not present

## 2018-05-13 DIAGNOSIS — N186 End stage renal disease: Secondary | ICD-10-CM | POA: Diagnosis not present

## 2018-05-13 DIAGNOSIS — D472 Monoclonal gammopathy: Secondary | ICD-10-CM | POA: Diagnosis not present

## 2018-05-13 DIAGNOSIS — Z992 Dependence on renal dialysis: Secondary | ICD-10-CM | POA: Diagnosis not present

## 2018-05-13 DIAGNOSIS — N2581 Secondary hyperparathyroidism of renal origin: Secondary | ICD-10-CM | POA: Diagnosis not present

## 2018-05-13 DIAGNOSIS — E611 Iron deficiency: Secondary | ICD-10-CM | POA: Diagnosis not present

## 2018-05-13 DIAGNOSIS — D631 Anemia in chronic kidney disease: Secondary | ICD-10-CM | POA: Diagnosis not present

## 2018-05-16 DIAGNOSIS — E611 Iron deficiency: Secondary | ICD-10-CM | POA: Diagnosis not present

## 2018-05-16 DIAGNOSIS — N2581 Secondary hyperparathyroidism of renal origin: Secondary | ICD-10-CM | POA: Diagnosis not present

## 2018-05-16 DIAGNOSIS — N186 End stage renal disease: Secondary | ICD-10-CM | POA: Diagnosis not present

## 2018-05-16 DIAGNOSIS — Z992 Dependence on renal dialysis: Secondary | ICD-10-CM | POA: Diagnosis not present

## 2018-05-16 DIAGNOSIS — D472 Monoclonal gammopathy: Secondary | ICD-10-CM | POA: Diagnosis not present

## 2018-05-16 DIAGNOSIS — D631 Anemia in chronic kidney disease: Secondary | ICD-10-CM | POA: Diagnosis not present

## 2018-05-17 DIAGNOSIS — M79642 Pain in left hand: Secondary | ICD-10-CM | POA: Diagnosis not present

## 2018-05-17 DIAGNOSIS — G8929 Other chronic pain: Secondary | ICD-10-CM | POA: Diagnosis not present

## 2018-05-18 DIAGNOSIS — G8929 Other chronic pain: Secondary | ICD-10-CM | POA: Diagnosis not present

## 2018-05-18 DIAGNOSIS — M79642 Pain in left hand: Secondary | ICD-10-CM | POA: Diagnosis not present

## 2018-05-18 DIAGNOSIS — R112 Nausea with vomiting, unspecified: Secondary | ICD-10-CM | POA: Diagnosis not present

## 2018-05-18 DIAGNOSIS — E215 Disorder of parathyroid gland, unspecified: Secondary | ICD-10-CM | POA: Diagnosis not present

## 2018-05-18 DIAGNOSIS — E46 Unspecified protein-calorie malnutrition: Secondary | ICD-10-CM | POA: Diagnosis not present

## 2018-05-19 DIAGNOSIS — G8929 Other chronic pain: Secondary | ICD-10-CM | POA: Diagnosis not present

## 2018-05-19 DIAGNOSIS — M79642 Pain in left hand: Secondary | ICD-10-CM | POA: Diagnosis not present

## 2018-05-20 DIAGNOSIS — D472 Monoclonal gammopathy: Secondary | ICD-10-CM | POA: Diagnosis not present

## 2018-05-20 DIAGNOSIS — N2581 Secondary hyperparathyroidism of renal origin: Secondary | ICD-10-CM | POA: Diagnosis not present

## 2018-05-20 DIAGNOSIS — G8929 Other chronic pain: Secondary | ICD-10-CM | POA: Diagnosis not present

## 2018-05-20 DIAGNOSIS — E611 Iron deficiency: Secondary | ICD-10-CM | POA: Diagnosis not present

## 2018-05-20 DIAGNOSIS — Z992 Dependence on renal dialysis: Secondary | ICD-10-CM | POA: Diagnosis not present

## 2018-05-20 DIAGNOSIS — M79642 Pain in left hand: Secondary | ICD-10-CM | POA: Diagnosis not present

## 2018-05-20 DIAGNOSIS — D631 Anemia in chronic kidney disease: Secondary | ICD-10-CM | POA: Diagnosis not present

## 2018-05-20 DIAGNOSIS — N186 End stage renal disease: Secondary | ICD-10-CM | POA: Diagnosis not present

## 2018-05-22 DIAGNOSIS — N186 End stage renal disease: Secondary | ICD-10-CM | POA: Diagnosis not present

## 2018-05-22 DIAGNOSIS — D472 Monoclonal gammopathy: Secondary | ICD-10-CM | POA: Diagnosis not present

## 2018-05-22 DIAGNOSIS — D631 Anemia in chronic kidney disease: Secondary | ICD-10-CM | POA: Diagnosis not present

## 2018-05-22 DIAGNOSIS — N2581 Secondary hyperparathyroidism of renal origin: Secondary | ICD-10-CM | POA: Diagnosis not present

## 2018-05-22 DIAGNOSIS — E611 Iron deficiency: Secondary | ICD-10-CM | POA: Diagnosis not present

## 2018-05-22 DIAGNOSIS — Z992 Dependence on renal dialysis: Secondary | ICD-10-CM | POA: Diagnosis not present

## 2018-05-23 DIAGNOSIS — N186 End stage renal disease: Secondary | ICD-10-CM | POA: Diagnosis not present

## 2018-05-23 DIAGNOSIS — D631 Anemia in chronic kidney disease: Secondary | ICD-10-CM | POA: Diagnosis not present

## 2018-05-23 DIAGNOSIS — N2581 Secondary hyperparathyroidism of renal origin: Secondary | ICD-10-CM | POA: Diagnosis not present

## 2018-05-23 DIAGNOSIS — Z992 Dependence on renal dialysis: Secondary | ICD-10-CM | POA: Diagnosis not present

## 2018-05-23 DIAGNOSIS — E611 Iron deficiency: Secondary | ICD-10-CM | POA: Diagnosis not present

## 2018-05-23 DIAGNOSIS — D472 Monoclonal gammopathy: Secondary | ICD-10-CM | POA: Diagnosis not present

## 2018-05-24 DIAGNOSIS — M79642 Pain in left hand: Secondary | ICD-10-CM | POA: Diagnosis not present

## 2018-05-24 DIAGNOSIS — G8929 Other chronic pain: Secondary | ICD-10-CM | POA: Diagnosis not present

## 2018-05-25 DIAGNOSIS — F411 Generalized anxiety disorder: Secondary | ICD-10-CM | POA: Diagnosis not present

## 2018-05-25 DIAGNOSIS — F331 Major depressive disorder, recurrent, moderate: Secondary | ICD-10-CM | POA: Diagnosis not present

## 2018-05-25 DIAGNOSIS — G8929 Other chronic pain: Secondary | ICD-10-CM | POA: Diagnosis not present

## 2018-05-25 DIAGNOSIS — M79642 Pain in left hand: Secondary | ICD-10-CM | POA: Diagnosis not present

## 2018-05-25 DIAGNOSIS — F5101 Primary insomnia: Secondary | ICD-10-CM | POA: Diagnosis not present

## 2018-05-27 DIAGNOSIS — G8929 Other chronic pain: Secondary | ICD-10-CM | POA: Diagnosis not present

## 2018-05-27 DIAGNOSIS — Z992 Dependence on renal dialysis: Secondary | ICD-10-CM | POA: Diagnosis not present

## 2018-05-27 DIAGNOSIS — M79642 Pain in left hand: Secondary | ICD-10-CM | POA: Diagnosis not present

## 2018-05-27 DIAGNOSIS — E611 Iron deficiency: Secondary | ICD-10-CM | POA: Diagnosis not present

## 2018-05-27 DIAGNOSIS — N2581 Secondary hyperparathyroidism of renal origin: Secondary | ICD-10-CM | POA: Diagnosis not present

## 2018-05-27 DIAGNOSIS — D631 Anemia in chronic kidney disease: Secondary | ICD-10-CM | POA: Diagnosis not present

## 2018-05-27 DIAGNOSIS — D472 Monoclonal gammopathy: Secondary | ICD-10-CM | POA: Diagnosis not present

## 2018-05-27 DIAGNOSIS — N186 End stage renal disease: Secondary | ICD-10-CM | POA: Diagnosis not present

## 2018-05-28 DIAGNOSIS — G8929 Other chronic pain: Secondary | ICD-10-CM | POA: Diagnosis not present

## 2018-05-28 DIAGNOSIS — M79642 Pain in left hand: Secondary | ICD-10-CM | POA: Diagnosis not present

## 2018-05-30 DIAGNOSIS — G8929 Other chronic pain: Secondary | ICD-10-CM | POA: Diagnosis not present

## 2018-05-30 DIAGNOSIS — N2581 Secondary hyperparathyroidism of renal origin: Secondary | ICD-10-CM | POA: Diagnosis not present

## 2018-05-30 DIAGNOSIS — D472 Monoclonal gammopathy: Secondary | ICD-10-CM | POA: Diagnosis not present

## 2018-05-30 DIAGNOSIS — E611 Iron deficiency: Secondary | ICD-10-CM | POA: Diagnosis not present

## 2018-05-30 DIAGNOSIS — Z992 Dependence on renal dialysis: Secondary | ICD-10-CM | POA: Diagnosis not present

## 2018-05-30 DIAGNOSIS — M79642 Pain in left hand: Secondary | ICD-10-CM | POA: Diagnosis not present

## 2018-05-30 DIAGNOSIS — N186 End stage renal disease: Secondary | ICD-10-CM | POA: Diagnosis not present

## 2018-05-30 DIAGNOSIS — D631 Anemia in chronic kidney disease: Secondary | ICD-10-CM | POA: Diagnosis not present

## 2018-05-31 DIAGNOSIS — N186 End stage renal disease: Secondary | ICD-10-CM | POA: Diagnosis not present

## 2018-05-31 DIAGNOSIS — Z992 Dependence on renal dialysis: Secondary | ICD-10-CM | POA: Diagnosis not present

## 2018-06-01 DIAGNOSIS — Z992 Dependence on renal dialysis: Secondary | ICD-10-CM | POA: Diagnosis not present

## 2018-06-01 DIAGNOSIS — D472 Monoclonal gammopathy: Secondary | ICD-10-CM | POA: Diagnosis not present

## 2018-06-01 DIAGNOSIS — N186 End stage renal disease: Secondary | ICD-10-CM | POA: Diagnosis not present

## 2018-06-01 DIAGNOSIS — E611 Iron deficiency: Secondary | ICD-10-CM | POA: Diagnosis not present

## 2018-06-01 DIAGNOSIS — N2581 Secondary hyperparathyroidism of renal origin: Secondary | ICD-10-CM | POA: Diagnosis not present

## 2018-06-01 DIAGNOSIS — D631 Anemia in chronic kidney disease: Secondary | ICD-10-CM | POA: Diagnosis not present

## 2018-06-03 DIAGNOSIS — D472 Monoclonal gammopathy: Secondary | ICD-10-CM | POA: Diagnosis not present

## 2018-06-03 DIAGNOSIS — N186 End stage renal disease: Secondary | ICD-10-CM | POA: Diagnosis not present

## 2018-06-03 DIAGNOSIS — Z992 Dependence on renal dialysis: Secondary | ICD-10-CM | POA: Diagnosis not present

## 2018-06-03 DIAGNOSIS — N2581 Secondary hyperparathyroidism of renal origin: Secondary | ICD-10-CM | POA: Diagnosis not present

## 2018-06-03 DIAGNOSIS — E611 Iron deficiency: Secondary | ICD-10-CM | POA: Diagnosis not present

## 2018-06-03 DIAGNOSIS — D631 Anemia in chronic kidney disease: Secondary | ICD-10-CM | POA: Diagnosis not present

## 2018-06-06 DIAGNOSIS — N2581 Secondary hyperparathyroidism of renal origin: Secondary | ICD-10-CM | POA: Diagnosis not present

## 2018-06-06 DIAGNOSIS — E611 Iron deficiency: Secondary | ICD-10-CM | POA: Diagnosis not present

## 2018-06-06 DIAGNOSIS — Z992 Dependence on renal dialysis: Secondary | ICD-10-CM | POA: Diagnosis not present

## 2018-06-06 DIAGNOSIS — N186 End stage renal disease: Secondary | ICD-10-CM | POA: Diagnosis not present

## 2018-06-06 DIAGNOSIS — D472 Monoclonal gammopathy: Secondary | ICD-10-CM | POA: Diagnosis not present

## 2018-06-06 DIAGNOSIS — D631 Anemia in chronic kidney disease: Secondary | ICD-10-CM | POA: Diagnosis not present

## 2018-06-08 DIAGNOSIS — F339 Major depressive disorder, recurrent, unspecified: Secondary | ICD-10-CM | POA: Diagnosis not present

## 2018-06-08 DIAGNOSIS — I480 Paroxysmal atrial fibrillation: Secondary | ICD-10-CM | POA: Diagnosis not present

## 2018-06-08 DIAGNOSIS — G4700 Insomnia, unspecified: Secondary | ICD-10-CM | POA: Diagnosis not present

## 2018-06-08 DIAGNOSIS — F419 Anxiety disorder, unspecified: Secondary | ICD-10-CM | POA: Diagnosis not present

## 2018-06-10 DIAGNOSIS — N2581 Secondary hyperparathyroidism of renal origin: Secondary | ICD-10-CM | POA: Diagnosis not present

## 2018-06-10 DIAGNOSIS — Z992 Dependence on renal dialysis: Secondary | ICD-10-CM | POA: Diagnosis not present

## 2018-06-10 DIAGNOSIS — E611 Iron deficiency: Secondary | ICD-10-CM | POA: Diagnosis not present

## 2018-06-10 DIAGNOSIS — N186 End stage renal disease: Secondary | ICD-10-CM | POA: Diagnosis not present

## 2018-06-10 DIAGNOSIS — D631 Anemia in chronic kidney disease: Secondary | ICD-10-CM | POA: Diagnosis not present

## 2018-06-10 DIAGNOSIS — D472 Monoclonal gammopathy: Secondary | ICD-10-CM | POA: Diagnosis not present

## 2018-06-15 DIAGNOSIS — E611 Iron deficiency: Secondary | ICD-10-CM | POA: Diagnosis not present

## 2018-06-15 DIAGNOSIS — N2581 Secondary hyperparathyroidism of renal origin: Secondary | ICD-10-CM | POA: Diagnosis not present

## 2018-06-15 DIAGNOSIS — D631 Anemia in chronic kidney disease: Secondary | ICD-10-CM | POA: Diagnosis not present

## 2018-06-15 DIAGNOSIS — Z79899 Other long term (current) drug therapy: Secondary | ICD-10-CM | POA: Diagnosis not present

## 2018-06-15 DIAGNOSIS — D472 Monoclonal gammopathy: Secondary | ICD-10-CM | POA: Diagnosis not present

## 2018-06-15 DIAGNOSIS — I480 Paroxysmal atrial fibrillation: Secondary | ICD-10-CM | POA: Diagnosis not present

## 2018-06-15 DIAGNOSIS — N186 End stage renal disease: Secondary | ICD-10-CM | POA: Diagnosis not present

## 2018-06-15 DIAGNOSIS — Z992 Dependence on renal dialysis: Secondary | ICD-10-CM | POA: Diagnosis not present

## 2018-06-15 DIAGNOSIS — F339 Major depressive disorder, recurrent, unspecified: Secondary | ICD-10-CM | POA: Diagnosis not present

## 2018-06-16 DIAGNOSIS — I739 Peripheral vascular disease, unspecified: Secondary | ICD-10-CM | POA: Diagnosis not present

## 2018-06-16 DIAGNOSIS — B351 Tinea unguium: Secondary | ICD-10-CM | POA: Diagnosis not present

## 2018-06-16 DIAGNOSIS — M2041 Other hammer toe(s) (acquired), right foot: Secondary | ICD-10-CM | POA: Diagnosis not present

## 2018-06-16 DIAGNOSIS — M2042 Other hammer toe(s) (acquired), left foot: Secondary | ICD-10-CM | POA: Diagnosis not present

## 2018-06-20 DIAGNOSIS — E611 Iron deficiency: Secondary | ICD-10-CM | POA: Diagnosis not present

## 2018-06-20 DIAGNOSIS — N2581 Secondary hyperparathyroidism of renal origin: Secondary | ICD-10-CM | POA: Diagnosis not present

## 2018-06-20 DIAGNOSIS — N186 End stage renal disease: Secondary | ICD-10-CM | POA: Diagnosis not present

## 2018-06-20 DIAGNOSIS — D472 Monoclonal gammopathy: Secondary | ICD-10-CM | POA: Diagnosis not present

## 2018-06-20 DIAGNOSIS — D631 Anemia in chronic kidney disease: Secondary | ICD-10-CM | POA: Diagnosis not present

## 2018-06-20 DIAGNOSIS — Z992 Dependence on renal dialysis: Secondary | ICD-10-CM | POA: Diagnosis not present

## 2018-06-21 DIAGNOSIS — N186 End stage renal disease: Secondary | ICD-10-CM | POA: Diagnosis not present

## 2018-06-21 DIAGNOSIS — D631 Anemia in chronic kidney disease: Secondary | ICD-10-CM | POA: Diagnosis not present

## 2018-06-21 DIAGNOSIS — D472 Monoclonal gammopathy: Secondary | ICD-10-CM | POA: Diagnosis not present

## 2018-06-21 DIAGNOSIS — N2581 Secondary hyperparathyroidism of renal origin: Secondary | ICD-10-CM | POA: Diagnosis not present

## 2018-06-21 DIAGNOSIS — Z992 Dependence on renal dialysis: Secondary | ICD-10-CM | POA: Diagnosis not present

## 2018-06-21 DIAGNOSIS — E611 Iron deficiency: Secondary | ICD-10-CM | POA: Diagnosis not present

## 2018-06-22 DIAGNOSIS — N2581 Secondary hyperparathyroidism of renal origin: Secondary | ICD-10-CM | POA: Diagnosis not present

## 2018-06-22 DIAGNOSIS — E611 Iron deficiency: Secondary | ICD-10-CM | POA: Diagnosis not present

## 2018-06-22 DIAGNOSIS — D472 Monoclonal gammopathy: Secondary | ICD-10-CM | POA: Diagnosis not present

## 2018-06-22 DIAGNOSIS — N186 End stage renal disease: Secondary | ICD-10-CM | POA: Diagnosis not present

## 2018-06-22 DIAGNOSIS — D631 Anemia in chronic kidney disease: Secondary | ICD-10-CM | POA: Diagnosis not present

## 2018-06-22 DIAGNOSIS — Z992 Dependence on renal dialysis: Secondary | ICD-10-CM | POA: Diagnosis not present

## 2018-06-27 DIAGNOSIS — D631 Anemia in chronic kidney disease: Secondary | ICD-10-CM | POA: Diagnosis not present

## 2018-06-27 DIAGNOSIS — D472 Monoclonal gammopathy: Secondary | ICD-10-CM | POA: Diagnosis not present

## 2018-06-27 DIAGNOSIS — Z992 Dependence on renal dialysis: Secondary | ICD-10-CM | POA: Diagnosis not present

## 2018-06-27 DIAGNOSIS — N2581 Secondary hyperparathyroidism of renal origin: Secondary | ICD-10-CM | POA: Diagnosis not present

## 2018-06-27 DIAGNOSIS — E611 Iron deficiency: Secondary | ICD-10-CM | POA: Diagnosis not present

## 2018-06-27 DIAGNOSIS — N186 End stage renal disease: Secondary | ICD-10-CM | POA: Diagnosis not present

## 2018-06-29 DIAGNOSIS — F5101 Primary insomnia: Secondary | ICD-10-CM | POA: Diagnosis not present

## 2018-06-29 DIAGNOSIS — F331 Major depressive disorder, recurrent, moderate: Secondary | ICD-10-CM | POA: Diagnosis not present

## 2018-06-29 DIAGNOSIS — F411 Generalized anxiety disorder: Secondary | ICD-10-CM | POA: Diagnosis not present

## 2018-06-30 DIAGNOSIS — N186 End stage renal disease: Secondary | ICD-10-CM | POA: Diagnosis not present

## 2018-06-30 DIAGNOSIS — Z992 Dependence on renal dialysis: Secondary | ICD-10-CM | POA: Diagnosis not present

## 2018-07-01 DIAGNOSIS — N2581 Secondary hyperparathyroidism of renal origin: Secondary | ICD-10-CM | POA: Diagnosis not present

## 2018-07-01 DIAGNOSIS — E611 Iron deficiency: Secondary | ICD-10-CM | POA: Diagnosis not present

## 2018-07-01 DIAGNOSIS — Z992 Dependence on renal dialysis: Secondary | ICD-10-CM | POA: Diagnosis not present

## 2018-07-01 DIAGNOSIS — D472 Monoclonal gammopathy: Secondary | ICD-10-CM | POA: Diagnosis not present

## 2018-07-01 DIAGNOSIS — D631 Anemia in chronic kidney disease: Secondary | ICD-10-CM | POA: Diagnosis not present

## 2018-07-01 DIAGNOSIS — N186 End stage renal disease: Secondary | ICD-10-CM | POA: Diagnosis not present

## 2018-07-04 DIAGNOSIS — N186 End stage renal disease: Secondary | ICD-10-CM | POA: Diagnosis not present

## 2018-07-04 DIAGNOSIS — D472 Monoclonal gammopathy: Secondary | ICD-10-CM | POA: Diagnosis not present

## 2018-07-04 DIAGNOSIS — E611 Iron deficiency: Secondary | ICD-10-CM | POA: Diagnosis not present

## 2018-07-04 DIAGNOSIS — N2581 Secondary hyperparathyroidism of renal origin: Secondary | ICD-10-CM | POA: Diagnosis not present

## 2018-07-04 DIAGNOSIS — D631 Anemia in chronic kidney disease: Secondary | ICD-10-CM | POA: Diagnosis not present

## 2018-07-04 DIAGNOSIS — Z992 Dependence on renal dialysis: Secondary | ICD-10-CM | POA: Diagnosis not present

## 2018-07-06 DIAGNOSIS — D472 Monoclonal gammopathy: Secondary | ICD-10-CM | POA: Diagnosis not present

## 2018-07-06 DIAGNOSIS — D631 Anemia in chronic kidney disease: Secondary | ICD-10-CM | POA: Diagnosis not present

## 2018-07-06 DIAGNOSIS — E611 Iron deficiency: Secondary | ICD-10-CM | POA: Diagnosis not present

## 2018-07-06 DIAGNOSIS — N186 End stage renal disease: Secondary | ICD-10-CM | POA: Diagnosis not present

## 2018-07-06 DIAGNOSIS — N2581 Secondary hyperparathyroidism of renal origin: Secondary | ICD-10-CM | POA: Diagnosis not present

## 2018-07-06 DIAGNOSIS — Z992 Dependence on renal dialysis: Secondary | ICD-10-CM | POA: Diagnosis not present

## 2018-07-08 DIAGNOSIS — E611 Iron deficiency: Secondary | ICD-10-CM | POA: Diagnosis not present

## 2018-07-08 DIAGNOSIS — N186 End stage renal disease: Secondary | ICD-10-CM | POA: Diagnosis not present

## 2018-07-08 DIAGNOSIS — D472 Monoclonal gammopathy: Secondary | ICD-10-CM | POA: Diagnosis not present

## 2018-07-08 DIAGNOSIS — N2581 Secondary hyperparathyroidism of renal origin: Secondary | ICD-10-CM | POA: Diagnosis not present

## 2018-07-08 DIAGNOSIS — Z992 Dependence on renal dialysis: Secondary | ICD-10-CM | POA: Diagnosis not present

## 2018-07-08 DIAGNOSIS — D631 Anemia in chronic kidney disease: Secondary | ICD-10-CM | POA: Diagnosis not present

## 2018-07-11 DIAGNOSIS — D472 Monoclonal gammopathy: Secondary | ICD-10-CM | POA: Diagnosis not present

## 2018-07-11 DIAGNOSIS — N2581 Secondary hyperparathyroidism of renal origin: Secondary | ICD-10-CM | POA: Diagnosis not present

## 2018-07-11 DIAGNOSIS — D631 Anemia in chronic kidney disease: Secondary | ICD-10-CM | POA: Diagnosis not present

## 2018-07-11 DIAGNOSIS — E611 Iron deficiency: Secondary | ICD-10-CM | POA: Diagnosis not present

## 2018-07-11 DIAGNOSIS — Z992 Dependence on renal dialysis: Secondary | ICD-10-CM | POA: Diagnosis not present

## 2018-07-11 DIAGNOSIS — N186 End stage renal disease: Secondary | ICD-10-CM | POA: Diagnosis not present

## 2018-07-15 DIAGNOSIS — Z992 Dependence on renal dialysis: Secondary | ICD-10-CM | POA: Diagnosis not present

## 2018-07-15 DIAGNOSIS — D631 Anemia in chronic kidney disease: Secondary | ICD-10-CM | POA: Diagnosis not present

## 2018-07-15 DIAGNOSIS — D472 Monoclonal gammopathy: Secondary | ICD-10-CM | POA: Diagnosis not present

## 2018-07-15 DIAGNOSIS — N2581 Secondary hyperparathyroidism of renal origin: Secondary | ICD-10-CM | POA: Diagnosis not present

## 2018-07-15 DIAGNOSIS — N186 End stage renal disease: Secondary | ICD-10-CM | POA: Diagnosis not present

## 2018-07-15 DIAGNOSIS — E611 Iron deficiency: Secondary | ICD-10-CM | POA: Diagnosis not present

## 2018-07-18 DIAGNOSIS — D631 Anemia in chronic kidney disease: Secondary | ICD-10-CM | POA: Diagnosis not present

## 2018-07-18 DIAGNOSIS — Z992 Dependence on renal dialysis: Secondary | ICD-10-CM | POA: Diagnosis not present

## 2018-07-18 DIAGNOSIS — E611 Iron deficiency: Secondary | ICD-10-CM | POA: Diagnosis not present

## 2018-07-18 DIAGNOSIS — D472 Monoclonal gammopathy: Secondary | ICD-10-CM | POA: Diagnosis not present

## 2018-07-18 DIAGNOSIS — N186 End stage renal disease: Secondary | ICD-10-CM | POA: Diagnosis not present

## 2018-07-18 DIAGNOSIS — N2581 Secondary hyperparathyroidism of renal origin: Secondary | ICD-10-CM | POA: Diagnosis not present

## 2018-07-19 ENCOUNTER — Other Ambulatory Visit: Payer: Medicare Other

## 2018-07-21 DIAGNOSIS — N2581 Secondary hyperparathyroidism of renal origin: Secondary | ICD-10-CM | POA: Diagnosis not present

## 2018-07-21 DIAGNOSIS — N186 End stage renal disease: Secondary | ICD-10-CM | POA: Diagnosis not present

## 2018-07-21 DIAGNOSIS — Z992 Dependence on renal dialysis: Secondary | ICD-10-CM | POA: Diagnosis not present

## 2018-07-21 DIAGNOSIS — E611 Iron deficiency: Secondary | ICD-10-CM | POA: Diagnosis not present

## 2018-07-21 DIAGNOSIS — D631 Anemia in chronic kidney disease: Secondary | ICD-10-CM | POA: Diagnosis not present

## 2018-07-21 DIAGNOSIS — D472 Monoclonal gammopathy: Secondary | ICD-10-CM | POA: Diagnosis not present

## 2018-07-22 DIAGNOSIS — D472 Monoclonal gammopathy: Secondary | ICD-10-CM | POA: Diagnosis not present

## 2018-07-22 DIAGNOSIS — N186 End stage renal disease: Secondary | ICD-10-CM | POA: Diagnosis not present

## 2018-07-22 DIAGNOSIS — N2581 Secondary hyperparathyroidism of renal origin: Secondary | ICD-10-CM | POA: Diagnosis not present

## 2018-07-22 DIAGNOSIS — Z992 Dependence on renal dialysis: Secondary | ICD-10-CM | POA: Diagnosis not present

## 2018-07-22 DIAGNOSIS — D631 Anemia in chronic kidney disease: Secondary | ICD-10-CM | POA: Diagnosis not present

## 2018-07-22 DIAGNOSIS — E611 Iron deficiency: Secondary | ICD-10-CM | POA: Diagnosis not present

## 2018-07-23 DIAGNOSIS — K219 Gastro-esophageal reflux disease without esophagitis: Secondary | ICD-10-CM | POA: Diagnosis not present

## 2018-07-23 DIAGNOSIS — I480 Paroxysmal atrial fibrillation: Secondary | ICD-10-CM | POA: Diagnosis not present

## 2018-07-23 DIAGNOSIS — Z8673 Personal history of transient ischemic attack (TIA), and cerebral infarction without residual deficits: Secondary | ICD-10-CM | POA: Diagnosis not present

## 2018-07-23 DIAGNOSIS — Z992 Dependence on renal dialysis: Secondary | ICD-10-CM | POA: Diagnosis not present

## 2018-07-25 DIAGNOSIS — D472 Monoclonal gammopathy: Secondary | ICD-10-CM | POA: Diagnosis not present

## 2018-07-25 DIAGNOSIS — N186 End stage renal disease: Secondary | ICD-10-CM | POA: Diagnosis not present

## 2018-07-25 DIAGNOSIS — N2581 Secondary hyperparathyroidism of renal origin: Secondary | ICD-10-CM | POA: Diagnosis not present

## 2018-07-25 DIAGNOSIS — D631 Anemia in chronic kidney disease: Secondary | ICD-10-CM | POA: Diagnosis not present

## 2018-07-25 DIAGNOSIS — Z992 Dependence on renal dialysis: Secondary | ICD-10-CM | POA: Diagnosis not present

## 2018-07-25 DIAGNOSIS — E611 Iron deficiency: Secondary | ICD-10-CM | POA: Diagnosis not present

## 2018-07-27 DIAGNOSIS — F5101 Primary insomnia: Secondary | ICD-10-CM | POA: Diagnosis not present

## 2018-07-27 DIAGNOSIS — F411 Generalized anxiety disorder: Secondary | ICD-10-CM | POA: Diagnosis not present

## 2018-07-27 DIAGNOSIS — F331 Major depressive disorder, recurrent, moderate: Secondary | ICD-10-CM | POA: Diagnosis not present

## 2018-07-29 DIAGNOSIS — D472 Monoclonal gammopathy: Secondary | ICD-10-CM | POA: Diagnosis not present

## 2018-07-29 DIAGNOSIS — E611 Iron deficiency: Secondary | ICD-10-CM | POA: Diagnosis not present

## 2018-07-29 DIAGNOSIS — N2581 Secondary hyperparathyroidism of renal origin: Secondary | ICD-10-CM | POA: Diagnosis not present

## 2018-07-29 DIAGNOSIS — D631 Anemia in chronic kidney disease: Secondary | ICD-10-CM | POA: Diagnosis not present

## 2018-07-29 DIAGNOSIS — N186 End stage renal disease: Secondary | ICD-10-CM | POA: Diagnosis not present

## 2018-07-29 DIAGNOSIS — Z992 Dependence on renal dialysis: Secondary | ICD-10-CM | POA: Diagnosis not present

## 2018-08-01 DIAGNOSIS — N186 End stage renal disease: Secondary | ICD-10-CM | POA: Diagnosis not present

## 2018-08-01 DIAGNOSIS — N2581 Secondary hyperparathyroidism of renal origin: Secondary | ICD-10-CM | POA: Diagnosis not present

## 2018-08-01 DIAGNOSIS — Z20828 Contact with and (suspected) exposure to other viral communicable diseases: Secondary | ICD-10-CM | POA: Diagnosis not present

## 2018-08-01 DIAGNOSIS — Z1383 Encounter for screening for respiratory disorder NEC: Secondary | ICD-10-CM | POA: Diagnosis not present

## 2018-08-01 DIAGNOSIS — D472 Monoclonal gammopathy: Secondary | ICD-10-CM | POA: Diagnosis not present

## 2018-08-01 DIAGNOSIS — D631 Anemia in chronic kidney disease: Secondary | ICD-10-CM | POA: Diagnosis not present

## 2018-08-01 DIAGNOSIS — Z992 Dependence on renal dialysis: Secondary | ICD-10-CM | POA: Diagnosis not present

## 2018-08-01 DIAGNOSIS — E611 Iron deficiency: Secondary | ICD-10-CM | POA: Diagnosis not present

## 2018-08-02 ENCOUNTER — Encounter: Payer: Medicare Other | Admitting: Infectious Diseases

## 2018-08-05 DIAGNOSIS — N186 End stage renal disease: Secondary | ICD-10-CM | POA: Diagnosis not present

## 2018-08-05 DIAGNOSIS — Z992 Dependence on renal dialysis: Secondary | ICD-10-CM | POA: Diagnosis not present

## 2018-08-05 DIAGNOSIS — N2581 Secondary hyperparathyroidism of renal origin: Secondary | ICD-10-CM | POA: Diagnosis not present

## 2018-08-05 DIAGNOSIS — D472 Monoclonal gammopathy: Secondary | ICD-10-CM | POA: Diagnosis not present

## 2018-08-05 DIAGNOSIS — E611 Iron deficiency: Secondary | ICD-10-CM | POA: Diagnosis not present

## 2018-08-05 DIAGNOSIS — D631 Anemia in chronic kidney disease: Secondary | ICD-10-CM | POA: Diagnosis not present

## 2018-08-08 DIAGNOSIS — E611 Iron deficiency: Secondary | ICD-10-CM | POA: Diagnosis not present

## 2018-08-08 DIAGNOSIS — N2581 Secondary hyperparathyroidism of renal origin: Secondary | ICD-10-CM | POA: Diagnosis not present

## 2018-08-08 DIAGNOSIS — N186 End stage renal disease: Secondary | ICD-10-CM | POA: Diagnosis not present

## 2018-08-08 DIAGNOSIS — D631 Anemia in chronic kidney disease: Secondary | ICD-10-CM | POA: Diagnosis not present

## 2018-08-08 DIAGNOSIS — D472 Monoclonal gammopathy: Secondary | ICD-10-CM | POA: Diagnosis not present

## 2018-08-08 DIAGNOSIS — Z992 Dependence on renal dialysis: Secondary | ICD-10-CM | POA: Diagnosis not present

## 2018-08-10 DIAGNOSIS — F331 Major depressive disorder, recurrent, moderate: Secondary | ICD-10-CM | POA: Diagnosis not present

## 2018-08-10 DIAGNOSIS — F5101 Primary insomnia: Secondary | ICD-10-CM | POA: Diagnosis not present

## 2018-08-10 DIAGNOSIS — F411 Generalized anxiety disorder: Secondary | ICD-10-CM | POA: Diagnosis not present

## 2018-08-12 DIAGNOSIS — N2581 Secondary hyperparathyroidism of renal origin: Secondary | ICD-10-CM | POA: Diagnosis not present

## 2018-08-12 DIAGNOSIS — E611 Iron deficiency: Secondary | ICD-10-CM | POA: Diagnosis not present

## 2018-08-12 DIAGNOSIS — D472 Monoclonal gammopathy: Secondary | ICD-10-CM | POA: Diagnosis not present

## 2018-08-12 DIAGNOSIS — D631 Anemia in chronic kidney disease: Secondary | ICD-10-CM | POA: Diagnosis not present

## 2018-08-12 DIAGNOSIS — Z992 Dependence on renal dialysis: Secondary | ICD-10-CM | POA: Diagnosis not present

## 2018-08-12 DIAGNOSIS — N186 End stage renal disease: Secondary | ICD-10-CM | POA: Diagnosis not present

## 2018-08-16 DIAGNOSIS — N186 End stage renal disease: Secondary | ICD-10-CM | POA: Diagnosis not present

## 2018-08-16 DIAGNOSIS — M6281 Muscle weakness (generalized): Secondary | ICD-10-CM | POA: Diagnosis not present

## 2018-08-17 DIAGNOSIS — D631 Anemia in chronic kidney disease: Secondary | ICD-10-CM | POA: Diagnosis not present

## 2018-08-17 DIAGNOSIS — Z992 Dependence on renal dialysis: Secondary | ICD-10-CM | POA: Diagnosis not present

## 2018-08-17 DIAGNOSIS — N186 End stage renal disease: Secondary | ICD-10-CM | POA: Diagnosis not present

## 2018-08-17 DIAGNOSIS — K219 Gastro-esophageal reflux disease without esophagitis: Secondary | ICD-10-CM | POA: Diagnosis not present

## 2018-08-17 DIAGNOSIS — E611 Iron deficiency: Secondary | ICD-10-CM | POA: Diagnosis not present

## 2018-08-17 DIAGNOSIS — F329 Major depressive disorder, single episode, unspecified: Secondary | ICD-10-CM | POA: Diagnosis not present

## 2018-08-17 DIAGNOSIS — I480 Paroxysmal atrial fibrillation: Secondary | ICD-10-CM | POA: Diagnosis not present

## 2018-08-17 DIAGNOSIS — N2581 Secondary hyperparathyroidism of renal origin: Secondary | ICD-10-CM | POA: Diagnosis not present

## 2018-08-17 DIAGNOSIS — B20 Human immunodeficiency virus [HIV] disease: Secondary | ICD-10-CM | POA: Diagnosis not present

## 2018-08-17 DIAGNOSIS — D472 Monoclonal gammopathy: Secondary | ICD-10-CM | POA: Diagnosis not present

## 2018-08-18 DIAGNOSIS — N186 End stage renal disease: Secondary | ICD-10-CM | POA: Diagnosis not present

## 2018-08-18 DIAGNOSIS — M6281 Muscle weakness (generalized): Secondary | ICD-10-CM | POA: Diagnosis not present

## 2018-08-19 DIAGNOSIS — E611 Iron deficiency: Secondary | ICD-10-CM | POA: Diagnosis not present

## 2018-08-19 DIAGNOSIS — N186 End stage renal disease: Secondary | ICD-10-CM | POA: Diagnosis not present

## 2018-08-19 DIAGNOSIS — Z992 Dependence on renal dialysis: Secondary | ICD-10-CM | POA: Diagnosis not present

## 2018-08-19 DIAGNOSIS — D472 Monoclonal gammopathy: Secondary | ICD-10-CM | POA: Diagnosis not present

## 2018-08-19 DIAGNOSIS — D631 Anemia in chronic kidney disease: Secondary | ICD-10-CM | POA: Diagnosis not present

## 2018-08-19 DIAGNOSIS — M6281 Muscle weakness (generalized): Secondary | ICD-10-CM | POA: Diagnosis not present

## 2018-08-19 DIAGNOSIS — N2581 Secondary hyperparathyroidism of renal origin: Secondary | ICD-10-CM | POA: Diagnosis not present

## 2018-08-20 DIAGNOSIS — Z992 Dependence on renal dialysis: Secondary | ICD-10-CM | POA: Diagnosis not present

## 2018-08-20 DIAGNOSIS — D631 Anemia in chronic kidney disease: Secondary | ICD-10-CM | POA: Diagnosis not present

## 2018-08-20 DIAGNOSIS — D472 Monoclonal gammopathy: Secondary | ICD-10-CM | POA: Diagnosis not present

## 2018-08-20 DIAGNOSIS — M6281 Muscle weakness (generalized): Secondary | ICD-10-CM | POA: Diagnosis not present

## 2018-08-20 DIAGNOSIS — E611 Iron deficiency: Secondary | ICD-10-CM | POA: Diagnosis not present

## 2018-08-20 DIAGNOSIS — N186 End stage renal disease: Secondary | ICD-10-CM | POA: Diagnosis not present

## 2018-08-20 DIAGNOSIS — N2581 Secondary hyperparathyroidism of renal origin: Secondary | ICD-10-CM | POA: Diagnosis not present

## 2018-08-21 DIAGNOSIS — N186 End stage renal disease: Secondary | ICD-10-CM | POA: Diagnosis not present

## 2018-08-21 DIAGNOSIS — M6281 Muscle weakness (generalized): Secondary | ICD-10-CM | POA: Diagnosis not present

## 2018-08-22 DIAGNOSIS — D631 Anemia in chronic kidney disease: Secondary | ICD-10-CM | POA: Diagnosis not present

## 2018-08-22 DIAGNOSIS — N2581 Secondary hyperparathyroidism of renal origin: Secondary | ICD-10-CM | POA: Diagnosis not present

## 2018-08-22 DIAGNOSIS — E611 Iron deficiency: Secondary | ICD-10-CM | POA: Diagnosis not present

## 2018-08-22 DIAGNOSIS — D472 Monoclonal gammopathy: Secondary | ICD-10-CM | POA: Diagnosis not present

## 2018-08-22 DIAGNOSIS — N186 End stage renal disease: Secondary | ICD-10-CM | POA: Diagnosis not present

## 2018-08-22 DIAGNOSIS — Z992 Dependence on renal dialysis: Secondary | ICD-10-CM | POA: Diagnosis not present

## 2018-08-23 DIAGNOSIS — M6281 Muscle weakness (generalized): Secondary | ICD-10-CM | POA: Diagnosis not present

## 2018-08-23 DIAGNOSIS — N186 End stage renal disease: Secondary | ICD-10-CM | POA: Diagnosis not present

## 2018-08-24 DIAGNOSIS — D631 Anemia in chronic kidney disease: Secondary | ICD-10-CM | POA: Diagnosis not present

## 2018-08-24 DIAGNOSIS — N186 End stage renal disease: Secondary | ICD-10-CM | POA: Diagnosis not present

## 2018-08-24 DIAGNOSIS — N2581 Secondary hyperparathyroidism of renal origin: Secondary | ICD-10-CM | POA: Diagnosis not present

## 2018-08-24 DIAGNOSIS — Z992 Dependence on renal dialysis: Secondary | ICD-10-CM | POA: Diagnosis not present

## 2018-08-24 DIAGNOSIS — E611 Iron deficiency: Secondary | ICD-10-CM | POA: Diagnosis not present

## 2018-08-24 DIAGNOSIS — D472 Monoclonal gammopathy: Secondary | ICD-10-CM | POA: Diagnosis not present

## 2018-08-25 DIAGNOSIS — M6281 Muscle weakness (generalized): Secondary | ICD-10-CM | POA: Diagnosis not present

## 2018-08-25 DIAGNOSIS — N186 End stage renal disease: Secondary | ICD-10-CM | POA: Diagnosis not present

## 2018-08-26 DIAGNOSIS — M6281 Muscle weakness (generalized): Secondary | ICD-10-CM | POA: Diagnosis not present

## 2018-08-26 DIAGNOSIS — N186 End stage renal disease: Secondary | ICD-10-CM | POA: Diagnosis not present

## 2018-08-27 DIAGNOSIS — N2581 Secondary hyperparathyroidism of renal origin: Secondary | ICD-10-CM | POA: Diagnosis not present

## 2018-08-27 DIAGNOSIS — D631 Anemia in chronic kidney disease: Secondary | ICD-10-CM | POA: Diagnosis not present

## 2018-08-27 DIAGNOSIS — Z992 Dependence on renal dialysis: Secondary | ICD-10-CM | POA: Diagnosis not present

## 2018-08-27 DIAGNOSIS — D472 Monoclonal gammopathy: Secondary | ICD-10-CM | POA: Diagnosis not present

## 2018-08-27 DIAGNOSIS — E611 Iron deficiency: Secondary | ICD-10-CM | POA: Diagnosis not present

## 2018-08-27 DIAGNOSIS — N186 End stage renal disease: Secondary | ICD-10-CM | POA: Diagnosis not present

## 2018-08-28 DIAGNOSIS — M6281 Muscle weakness (generalized): Secondary | ICD-10-CM | POA: Diagnosis not present

## 2018-08-28 DIAGNOSIS — N186 End stage renal disease: Secondary | ICD-10-CM | POA: Diagnosis not present

## 2018-08-29 DIAGNOSIS — N186 End stage renal disease: Secondary | ICD-10-CM | POA: Diagnosis not present

## 2018-08-29 DIAGNOSIS — M6281 Muscle weakness (generalized): Secondary | ICD-10-CM | POA: Diagnosis not present

## 2018-08-30 ENCOUNTER — Other Ambulatory Visit: Payer: Medicare Other

## 2018-08-30 DIAGNOSIS — M6281 Muscle weakness (generalized): Secondary | ICD-10-CM | POA: Diagnosis not present

## 2018-08-30 DIAGNOSIS — N186 End stage renal disease: Secondary | ICD-10-CM | POA: Diagnosis not present

## 2018-08-30 DIAGNOSIS — Z992 Dependence on renal dialysis: Secondary | ICD-10-CM | POA: Diagnosis not present

## 2018-08-31 DIAGNOSIS — D631 Anemia in chronic kidney disease: Secondary | ICD-10-CM | POA: Diagnosis not present

## 2018-08-31 DIAGNOSIS — N186 End stage renal disease: Secondary | ICD-10-CM | POA: Diagnosis not present

## 2018-08-31 DIAGNOSIS — E611 Iron deficiency: Secondary | ICD-10-CM | POA: Diagnosis not present

## 2018-08-31 DIAGNOSIS — Z992 Dependence on renal dialysis: Secondary | ICD-10-CM | POA: Diagnosis not present

## 2018-08-31 DIAGNOSIS — D509 Iron deficiency anemia, unspecified: Secondary | ICD-10-CM | POA: Diagnosis not present

## 2018-08-31 DIAGNOSIS — N2581 Secondary hyperparathyroidism of renal origin: Secondary | ICD-10-CM | POA: Diagnosis not present

## 2018-08-31 DIAGNOSIS — D472 Monoclonal gammopathy: Secondary | ICD-10-CM | POA: Diagnosis not present

## 2018-09-01 DIAGNOSIS — M6281 Muscle weakness (generalized): Secondary | ICD-10-CM | POA: Diagnosis not present

## 2018-09-01 DIAGNOSIS — N186 End stage renal disease: Secondary | ICD-10-CM | POA: Diagnosis not present

## 2018-09-02 DIAGNOSIS — D472 Monoclonal gammopathy: Secondary | ICD-10-CM | POA: Diagnosis not present

## 2018-09-02 DIAGNOSIS — N186 End stage renal disease: Secondary | ICD-10-CM | POA: Diagnosis not present

## 2018-09-02 DIAGNOSIS — E611 Iron deficiency: Secondary | ICD-10-CM | POA: Diagnosis not present

## 2018-09-02 DIAGNOSIS — Z992 Dependence on renal dialysis: Secondary | ICD-10-CM | POA: Diagnosis not present

## 2018-09-02 DIAGNOSIS — D509 Iron deficiency anemia, unspecified: Secondary | ICD-10-CM | POA: Diagnosis not present

## 2018-09-02 DIAGNOSIS — N2581 Secondary hyperparathyroidism of renal origin: Secondary | ICD-10-CM | POA: Diagnosis not present

## 2018-09-03 DIAGNOSIS — N186 End stage renal disease: Secondary | ICD-10-CM | POA: Diagnosis not present

## 2018-09-03 DIAGNOSIS — M6281 Muscle weakness (generalized): Secondary | ICD-10-CM | POA: Diagnosis not present

## 2018-09-05 DIAGNOSIS — N186 End stage renal disease: Secondary | ICD-10-CM | POA: Diagnosis not present

## 2018-09-05 DIAGNOSIS — E611 Iron deficiency: Secondary | ICD-10-CM | POA: Diagnosis not present

## 2018-09-05 DIAGNOSIS — N2581 Secondary hyperparathyroidism of renal origin: Secondary | ICD-10-CM | POA: Diagnosis not present

## 2018-09-05 DIAGNOSIS — Z992 Dependence on renal dialysis: Secondary | ICD-10-CM | POA: Diagnosis not present

## 2018-09-05 DIAGNOSIS — D472 Monoclonal gammopathy: Secondary | ICD-10-CM | POA: Diagnosis not present

## 2018-09-05 DIAGNOSIS — D509 Iron deficiency anemia, unspecified: Secondary | ICD-10-CM | POA: Diagnosis not present

## 2018-09-06 DIAGNOSIS — M6281 Muscle weakness (generalized): Secondary | ICD-10-CM | POA: Diagnosis not present

## 2018-09-06 DIAGNOSIS — N186 End stage renal disease: Secondary | ICD-10-CM | POA: Diagnosis not present

## 2018-09-07 DIAGNOSIS — F411 Generalized anxiety disorder: Secondary | ICD-10-CM | POA: Diagnosis not present

## 2018-09-07 DIAGNOSIS — F5101 Primary insomnia: Secondary | ICD-10-CM | POA: Diagnosis not present

## 2018-09-07 DIAGNOSIS — F331 Major depressive disorder, recurrent, moderate: Secondary | ICD-10-CM | POA: Diagnosis not present

## 2018-09-08 DIAGNOSIS — M6281 Muscle weakness (generalized): Secondary | ICD-10-CM | POA: Diagnosis not present

## 2018-09-08 DIAGNOSIS — N186 End stage renal disease: Secondary | ICD-10-CM | POA: Diagnosis not present

## 2018-09-09 DIAGNOSIS — D509 Iron deficiency anemia, unspecified: Secondary | ICD-10-CM | POA: Diagnosis not present

## 2018-09-09 DIAGNOSIS — N186 End stage renal disease: Secondary | ICD-10-CM | POA: Diagnosis not present

## 2018-09-09 DIAGNOSIS — Z992 Dependence on renal dialysis: Secondary | ICD-10-CM | POA: Diagnosis not present

## 2018-09-09 DIAGNOSIS — D472 Monoclonal gammopathy: Secondary | ICD-10-CM | POA: Diagnosis not present

## 2018-09-09 DIAGNOSIS — E611 Iron deficiency: Secondary | ICD-10-CM | POA: Diagnosis not present

## 2018-09-09 DIAGNOSIS — N2581 Secondary hyperparathyroidism of renal origin: Secondary | ICD-10-CM | POA: Diagnosis not present

## 2018-09-12 DIAGNOSIS — N2581 Secondary hyperparathyroidism of renal origin: Secondary | ICD-10-CM | POA: Diagnosis not present

## 2018-09-12 DIAGNOSIS — D509 Iron deficiency anemia, unspecified: Secondary | ICD-10-CM | POA: Diagnosis not present

## 2018-09-12 DIAGNOSIS — N186 End stage renal disease: Secondary | ICD-10-CM | POA: Diagnosis not present

## 2018-09-12 DIAGNOSIS — D472 Monoclonal gammopathy: Secondary | ICD-10-CM | POA: Diagnosis not present

## 2018-09-12 DIAGNOSIS — Z992 Dependence on renal dialysis: Secondary | ICD-10-CM | POA: Diagnosis not present

## 2018-09-12 DIAGNOSIS — E611 Iron deficiency: Secondary | ICD-10-CM | POA: Diagnosis not present

## 2018-09-13 ENCOUNTER — Encounter: Payer: Medicare Other | Admitting: Infectious Diseases

## 2018-09-14 DIAGNOSIS — N2581 Secondary hyperparathyroidism of renal origin: Secondary | ICD-10-CM | POA: Diagnosis not present

## 2018-09-14 DIAGNOSIS — Z992 Dependence on renal dialysis: Secondary | ICD-10-CM | POA: Diagnosis not present

## 2018-09-14 DIAGNOSIS — D509 Iron deficiency anemia, unspecified: Secondary | ICD-10-CM | POA: Diagnosis not present

## 2018-09-14 DIAGNOSIS — D472 Monoclonal gammopathy: Secondary | ICD-10-CM | POA: Diagnosis not present

## 2018-09-14 DIAGNOSIS — N186 End stage renal disease: Secondary | ICD-10-CM | POA: Diagnosis not present

## 2018-09-14 DIAGNOSIS — E611 Iron deficiency: Secondary | ICD-10-CM | POA: Diagnosis not present

## 2018-09-16 DIAGNOSIS — E611 Iron deficiency: Secondary | ICD-10-CM | POA: Diagnosis not present

## 2018-09-16 DIAGNOSIS — Z992 Dependence on renal dialysis: Secondary | ICD-10-CM | POA: Diagnosis not present

## 2018-09-16 DIAGNOSIS — D509 Iron deficiency anemia, unspecified: Secondary | ICD-10-CM | POA: Diagnosis not present

## 2018-09-16 DIAGNOSIS — D472 Monoclonal gammopathy: Secondary | ICD-10-CM | POA: Diagnosis not present

## 2018-09-16 DIAGNOSIS — N186 End stage renal disease: Secondary | ICD-10-CM | POA: Diagnosis not present

## 2018-09-16 DIAGNOSIS — N2581 Secondary hyperparathyroidism of renal origin: Secondary | ICD-10-CM | POA: Diagnosis not present

## 2018-09-19 ENCOUNTER — Telehealth: Payer: Self-pay | Admitting: *Deleted

## 2018-09-19 DIAGNOSIS — Z992 Dependence on renal dialysis: Secondary | ICD-10-CM | POA: Diagnosis not present

## 2018-09-19 DIAGNOSIS — D509 Iron deficiency anemia, unspecified: Secondary | ICD-10-CM | POA: Diagnosis not present

## 2018-09-19 DIAGNOSIS — E611 Iron deficiency: Secondary | ICD-10-CM | POA: Diagnosis not present

## 2018-09-19 DIAGNOSIS — N186 End stage renal disease: Secondary | ICD-10-CM | POA: Diagnosis not present

## 2018-09-19 DIAGNOSIS — N2581 Secondary hyperparathyroidism of renal origin: Secondary | ICD-10-CM | POA: Diagnosis not present

## 2018-09-19 DIAGNOSIS — H25813 Combined forms of age-related cataract, bilateral: Secondary | ICD-10-CM | POA: Diagnosis not present

## 2018-09-19 DIAGNOSIS — H524 Presbyopia: Secondary | ICD-10-CM | POA: Diagnosis not present

## 2018-09-19 DIAGNOSIS — D472 Monoclonal gammopathy: Secondary | ICD-10-CM | POA: Diagnosis not present

## 2018-09-19 NOTE — Telephone Encounter (Signed)
RN from [facility] called to ask if appt 09/22/18 could be an evisit. She advised they have to limit the amount of residents that are taken out to visits due to covid. She asked if we would like them to do any labs gave her Sed rate, CRP, CBC and CMP and the fax number. Advised her if we need anything else we will let them know at the visit.  Also asked her to give me a number to call for the evisit and she advised she will have to call me back with that.

## 2018-09-20 DIAGNOSIS — B20 Human immunodeficiency virus [HIV] disease: Secondary | ICD-10-CM | POA: Diagnosis not present

## 2018-09-20 DIAGNOSIS — D638 Anemia in other chronic diseases classified elsewhere: Secondary | ICD-10-CM | POA: Diagnosis not present

## 2018-09-20 DIAGNOSIS — D631 Anemia in chronic kidney disease: Secondary | ICD-10-CM | POA: Diagnosis not present

## 2018-09-20 NOTE — Telephone Encounter (Signed)
Thank you :)

## 2018-09-21 DIAGNOSIS — D472 Monoclonal gammopathy: Secondary | ICD-10-CM | POA: Diagnosis not present

## 2018-09-21 DIAGNOSIS — N2581 Secondary hyperparathyroidism of renal origin: Secondary | ICD-10-CM | POA: Diagnosis not present

## 2018-09-21 DIAGNOSIS — D509 Iron deficiency anemia, unspecified: Secondary | ICD-10-CM | POA: Diagnosis not present

## 2018-09-21 DIAGNOSIS — Z992 Dependence on renal dialysis: Secondary | ICD-10-CM | POA: Diagnosis not present

## 2018-09-21 DIAGNOSIS — E611 Iron deficiency: Secondary | ICD-10-CM | POA: Diagnosis not present

## 2018-09-21 DIAGNOSIS — N186 End stage renal disease: Secondary | ICD-10-CM | POA: Diagnosis not present

## 2018-09-22 ENCOUNTER — Other Ambulatory Visit: Payer: Self-pay

## 2018-09-22 ENCOUNTER — Ambulatory Visit (INDEPENDENT_AMBULATORY_CARE_PROVIDER_SITE_OTHER): Payer: Self-pay | Admitting: Infectious Diseases

## 2018-09-22 DIAGNOSIS — Z992 Dependence on renal dialysis: Secondary | ICD-10-CM

## 2018-09-22 DIAGNOSIS — B20 Human immunodeficiency virus [HIV] disease: Secondary | ICD-10-CM

## 2018-09-22 DIAGNOSIS — R112 Nausea with vomiting, unspecified: Secondary | ICD-10-CM

## 2018-09-22 DIAGNOSIS — N186 End stage renal disease: Secondary | ICD-10-CM

## 2018-09-22 NOTE — Assessment & Plan Note (Signed)
States this is resolved

## 2018-09-22 NOTE — Assessment & Plan Note (Signed)
She appears to be doing well.  She will continue with TIW HD.

## 2018-09-22 NOTE — Progress Notes (Signed)
   Subjective:    Patient ID: [name], female    DOB: [birth year], 68 y.o.   MRN: [id number]  HPI E-visit 68 yo F with HIV+, ESRD, diastolic CHF, parox aflutter, prev normal cath at [facility] (2016).staying at SNF [facility]). She is on tivicay/zidovudine/epivir dosed for her HD.  She was in hospital 2-15 to 04-19-16 foroccluded right subclavian vein, with stenosis of right innominate vein. Patient seen by vascular surgery, pt had ballonangioplasty of right subclavian vein and innominate vein. Her RUE AVF was again repaired 03-29-17. 103 # (per pt 48kg dry wt).   Has been feeling tired after HD. She also has myalgias.  No problems with her meds. Her mood has "pretty good". Has had to start wearing a splint on her R hand, due to arthritis.   HIV 1 RNA Quant (copies/mL)  Date Value  10/19/2017 <20 NOT DETECTED  04/22/2017 <20 NOT DETECTED  07/23/2015 51 (H)   CD4 T Cell Abs (/uL)  Date Value  10/19/2017 520  04/22/2017 450  07/23/2015 250 (L)    Review of Systems  Constitutional: Positive for appetite change. Negative for unexpected weight change.  Respiratory: Positive for shortness of breath (doe). Negative for cough.   Gastrointestinal: Negative for constipation and diarrhea.  Genitourinary: Negative for difficulty urinating.  Musculoskeletal: Positive for arthralgias.  Please see HPI. All other systems reviewed and negative.      Objective:   Physical Exam        Assessment & Plan:

## 2018-09-22 NOTE — Assessment & Plan Note (Addendum)
She appears to be doing well.  Remains at SNF. Denies missed rx.  Will ask lab, nursing to get CD4 and HIV RNA on her rtc in 6 months.  Will defer to PCP to arrange for mammo (evisit with her nurse present at facility)

## 2018-09-23 DIAGNOSIS — N186 End stage renal disease: Secondary | ICD-10-CM | POA: Diagnosis not present

## 2018-09-23 DIAGNOSIS — Z992 Dependence on renal dialysis: Secondary | ICD-10-CM | POA: Diagnosis not present

## 2018-09-23 DIAGNOSIS — D472 Monoclonal gammopathy: Secondary | ICD-10-CM | POA: Diagnosis not present

## 2018-09-23 DIAGNOSIS — N2581 Secondary hyperparathyroidism of renal origin: Secondary | ICD-10-CM | POA: Diagnosis not present

## 2018-09-23 DIAGNOSIS — D509 Iron deficiency anemia, unspecified: Secondary | ICD-10-CM | POA: Diagnosis not present

## 2018-09-23 DIAGNOSIS — E611 Iron deficiency: Secondary | ICD-10-CM | POA: Diagnosis not present

## 2018-09-26 DIAGNOSIS — E611 Iron deficiency: Secondary | ICD-10-CM | POA: Diagnosis not present

## 2018-09-26 DIAGNOSIS — D509 Iron deficiency anemia, unspecified: Secondary | ICD-10-CM | POA: Diagnosis not present

## 2018-09-26 DIAGNOSIS — Z992 Dependence on renal dialysis: Secondary | ICD-10-CM | POA: Diagnosis not present

## 2018-09-26 DIAGNOSIS — N2581 Secondary hyperparathyroidism of renal origin: Secondary | ICD-10-CM | POA: Diagnosis not present

## 2018-09-26 DIAGNOSIS — D472 Monoclonal gammopathy: Secondary | ICD-10-CM | POA: Diagnosis not present

## 2018-09-26 DIAGNOSIS — N186 End stage renal disease: Secondary | ICD-10-CM | POA: Diagnosis not present

## 2018-09-28 DIAGNOSIS — D472 Monoclonal gammopathy: Secondary | ICD-10-CM | POA: Diagnosis not present

## 2018-09-28 DIAGNOSIS — N186 End stage renal disease: Secondary | ICD-10-CM | POA: Diagnosis not present

## 2018-09-28 DIAGNOSIS — E611 Iron deficiency: Secondary | ICD-10-CM | POA: Diagnosis not present

## 2018-09-28 DIAGNOSIS — N2581 Secondary hyperparathyroidism of renal origin: Secondary | ICD-10-CM | POA: Diagnosis not present

## 2018-09-28 DIAGNOSIS — Z992 Dependence on renal dialysis: Secondary | ICD-10-CM | POA: Diagnosis not present

## 2018-09-28 DIAGNOSIS — D509 Iron deficiency anemia, unspecified: Secondary | ICD-10-CM | POA: Diagnosis not present

## 2018-09-29 DIAGNOSIS — F330 Major depressive disorder, recurrent, mild: Secondary | ICD-10-CM | POA: Diagnosis not present

## 2018-09-29 DIAGNOSIS — G4701 Insomnia due to medical condition: Secondary | ICD-10-CM | POA: Diagnosis not present

## 2018-09-29 DIAGNOSIS — F70 Mild intellectual disabilities: Secondary | ICD-10-CM | POA: Diagnosis not present

## 2018-09-29 DIAGNOSIS — F064 Anxiety disorder due to known physiological condition: Secondary | ICD-10-CM | POA: Diagnosis not present

## 2018-09-30 DIAGNOSIS — N2581 Secondary hyperparathyroidism of renal origin: Secondary | ICD-10-CM | POA: Diagnosis not present

## 2018-09-30 DIAGNOSIS — E611 Iron deficiency: Secondary | ICD-10-CM | POA: Diagnosis not present

## 2018-09-30 DIAGNOSIS — Z992 Dependence on renal dialysis: Secondary | ICD-10-CM | POA: Diagnosis not present

## 2018-09-30 DIAGNOSIS — N186 End stage renal disease: Secondary | ICD-10-CM | POA: Diagnosis not present

## 2018-09-30 DIAGNOSIS — D509 Iron deficiency anemia, unspecified: Secondary | ICD-10-CM | POA: Diagnosis not present

## 2018-09-30 DIAGNOSIS — D472 Monoclonal gammopathy: Secondary | ICD-10-CM | POA: Diagnosis not present

## 2018-10-01 DIAGNOSIS — Z992 Dependence on renal dialysis: Secondary | ICD-10-CM | POA: Diagnosis not present

## 2018-10-01 DIAGNOSIS — E611 Iron deficiency: Secondary | ICD-10-CM | POA: Diagnosis not present

## 2018-10-01 DIAGNOSIS — N2581 Secondary hyperparathyroidism of renal origin: Secondary | ICD-10-CM | POA: Diagnosis not present

## 2018-10-01 DIAGNOSIS — D472 Monoclonal gammopathy: Secondary | ICD-10-CM | POA: Diagnosis not present

## 2018-10-01 DIAGNOSIS — N186 End stage renal disease: Secondary | ICD-10-CM | POA: Diagnosis not present

## 2018-10-01 DIAGNOSIS — D631 Anemia in chronic kidney disease: Secondary | ICD-10-CM | POA: Diagnosis not present

## 2018-10-03 DIAGNOSIS — E611 Iron deficiency: Secondary | ICD-10-CM | POA: Diagnosis not present

## 2018-10-03 DIAGNOSIS — D631 Anemia in chronic kidney disease: Secondary | ICD-10-CM | POA: Diagnosis not present

## 2018-10-03 DIAGNOSIS — D472 Monoclonal gammopathy: Secondary | ICD-10-CM | POA: Diagnosis not present

## 2018-10-03 DIAGNOSIS — N186 End stage renal disease: Secondary | ICD-10-CM | POA: Diagnosis not present

## 2018-10-03 DIAGNOSIS — N2581 Secondary hyperparathyroidism of renal origin: Secondary | ICD-10-CM | POA: Diagnosis not present

## 2018-10-03 DIAGNOSIS — Z992 Dependence on renal dialysis: Secondary | ICD-10-CM | POA: Diagnosis not present

## 2018-10-05 DIAGNOSIS — Z992 Dependence on renal dialysis: Secondary | ICD-10-CM | POA: Diagnosis not present

## 2018-10-05 DIAGNOSIS — N2581 Secondary hyperparathyroidism of renal origin: Secondary | ICD-10-CM | POA: Diagnosis not present

## 2018-10-05 DIAGNOSIS — D472 Monoclonal gammopathy: Secondary | ICD-10-CM | POA: Diagnosis not present

## 2018-10-05 DIAGNOSIS — N186 End stage renal disease: Secondary | ICD-10-CM | POA: Diagnosis not present

## 2018-10-05 DIAGNOSIS — D631 Anemia in chronic kidney disease: Secondary | ICD-10-CM | POA: Diagnosis not present

## 2018-10-05 DIAGNOSIS — E611 Iron deficiency: Secondary | ICD-10-CM | POA: Diagnosis not present

## 2018-10-07 DIAGNOSIS — Z1383 Encounter for screening for respiratory disorder NEC: Secondary | ICD-10-CM | POA: Diagnosis not present

## 2018-10-07 DIAGNOSIS — Z20828 Contact with and (suspected) exposure to other viral communicable diseases: Secondary | ICD-10-CM | POA: Diagnosis not present

## 2018-10-10 DIAGNOSIS — D472 Monoclonal gammopathy: Secondary | ICD-10-CM | POA: Diagnosis not present

## 2018-10-10 DIAGNOSIS — I480 Paroxysmal atrial fibrillation: Secondary | ICD-10-CM | POA: Diagnosis not present

## 2018-10-10 DIAGNOSIS — N186 End stage renal disease: Secondary | ICD-10-CM | POA: Diagnosis not present

## 2018-10-10 DIAGNOSIS — Z992 Dependence on renal dialysis: Secondary | ICD-10-CM | POA: Diagnosis not present

## 2018-10-10 DIAGNOSIS — B20 Human immunodeficiency virus [HIV] disease: Secondary | ICD-10-CM | POA: Diagnosis not present

## 2018-10-10 DIAGNOSIS — N2581 Secondary hyperparathyroidism of renal origin: Secondary | ICD-10-CM | POA: Diagnosis not present

## 2018-10-10 DIAGNOSIS — I10 Essential (primary) hypertension: Secondary | ICD-10-CM | POA: Diagnosis not present

## 2018-10-10 DIAGNOSIS — D631 Anemia in chronic kidney disease: Secondary | ICD-10-CM | POA: Diagnosis not present

## 2018-10-10 DIAGNOSIS — E611 Iron deficiency: Secondary | ICD-10-CM | POA: Diagnosis not present

## 2018-10-12 DIAGNOSIS — N186 End stage renal disease: Secondary | ICD-10-CM | POA: Diagnosis not present

## 2018-10-12 DIAGNOSIS — D472 Monoclonal gammopathy: Secondary | ICD-10-CM | POA: Diagnosis not present

## 2018-10-12 DIAGNOSIS — E611 Iron deficiency: Secondary | ICD-10-CM | POA: Diagnosis not present

## 2018-10-12 DIAGNOSIS — D631 Anemia in chronic kidney disease: Secondary | ICD-10-CM | POA: Diagnosis not present

## 2018-10-12 DIAGNOSIS — Z992 Dependence on renal dialysis: Secondary | ICD-10-CM | POA: Diagnosis not present

## 2018-10-12 DIAGNOSIS — N2581 Secondary hyperparathyroidism of renal origin: Secondary | ICD-10-CM | POA: Diagnosis not present

## 2018-10-13 DIAGNOSIS — Z20828 Contact with and (suspected) exposure to other viral communicable diseases: Secondary | ICD-10-CM | POA: Diagnosis not present

## 2018-10-13 DIAGNOSIS — Z1383 Encounter for screening for respiratory disorder NEC: Secondary | ICD-10-CM | POA: Diagnosis not present

## 2018-10-17 DIAGNOSIS — D631 Anemia in chronic kidney disease: Secondary | ICD-10-CM | POA: Diagnosis not present

## 2018-10-17 DIAGNOSIS — N2581 Secondary hyperparathyroidism of renal origin: Secondary | ICD-10-CM | POA: Diagnosis not present

## 2018-10-17 DIAGNOSIS — E611 Iron deficiency: Secondary | ICD-10-CM | POA: Diagnosis not present

## 2018-10-17 DIAGNOSIS — D472 Monoclonal gammopathy: Secondary | ICD-10-CM | POA: Diagnosis not present

## 2018-10-17 DIAGNOSIS — N186 End stage renal disease: Secondary | ICD-10-CM | POA: Diagnosis not present

## 2018-10-17 DIAGNOSIS — Z992 Dependence on renal dialysis: Secondary | ICD-10-CM | POA: Diagnosis not present

## 2018-10-19 DIAGNOSIS — D631 Anemia in chronic kidney disease: Secondary | ICD-10-CM | POA: Diagnosis not present

## 2018-10-19 DIAGNOSIS — E611 Iron deficiency: Secondary | ICD-10-CM | POA: Diagnosis not present

## 2018-10-19 DIAGNOSIS — D472 Monoclonal gammopathy: Secondary | ICD-10-CM | POA: Diagnosis not present

## 2018-10-19 DIAGNOSIS — Z992 Dependence on renal dialysis: Secondary | ICD-10-CM | POA: Diagnosis not present

## 2018-10-19 DIAGNOSIS — N2581 Secondary hyperparathyroidism of renal origin: Secondary | ICD-10-CM | POA: Diagnosis not present

## 2018-10-19 DIAGNOSIS — N186 End stage renal disease: Secondary | ICD-10-CM | POA: Diagnosis not present

## 2018-10-21 DIAGNOSIS — D472 Monoclonal gammopathy: Secondary | ICD-10-CM | POA: Diagnosis not present

## 2018-10-21 DIAGNOSIS — D631 Anemia in chronic kidney disease: Secondary | ICD-10-CM | POA: Diagnosis not present

## 2018-10-21 DIAGNOSIS — E611 Iron deficiency: Secondary | ICD-10-CM | POA: Diagnosis not present

## 2018-10-21 DIAGNOSIS — N186 End stage renal disease: Secondary | ICD-10-CM | POA: Diagnosis not present

## 2018-10-21 DIAGNOSIS — Z992 Dependence on renal dialysis: Secondary | ICD-10-CM | POA: Diagnosis not present

## 2018-10-21 DIAGNOSIS — N2581 Secondary hyperparathyroidism of renal origin: Secondary | ICD-10-CM | POA: Diagnosis not present

## 2018-10-24 DIAGNOSIS — D472 Monoclonal gammopathy: Secondary | ICD-10-CM | POA: Diagnosis not present

## 2018-10-24 DIAGNOSIS — N2581 Secondary hyperparathyroidism of renal origin: Secondary | ICD-10-CM | POA: Diagnosis not present

## 2018-10-24 DIAGNOSIS — Z992 Dependence on renal dialysis: Secondary | ICD-10-CM | POA: Diagnosis not present

## 2018-10-24 DIAGNOSIS — E611 Iron deficiency: Secondary | ICD-10-CM | POA: Diagnosis not present

## 2018-10-24 DIAGNOSIS — N186 End stage renal disease: Secondary | ICD-10-CM | POA: Diagnosis not present

## 2018-10-24 DIAGNOSIS — D631 Anemia in chronic kidney disease: Secondary | ICD-10-CM | POA: Diagnosis not present

## 2018-10-24 DIAGNOSIS — Z1383 Encounter for screening for respiratory disorder NEC: Secondary | ICD-10-CM | POA: Diagnosis not present

## 2018-10-24 DIAGNOSIS — Z20828 Contact with and (suspected) exposure to other viral communicable diseases: Secondary | ICD-10-CM | POA: Diagnosis not present

## 2018-10-28 DIAGNOSIS — N2581 Secondary hyperparathyroidism of renal origin: Secondary | ICD-10-CM | POA: Diagnosis not present

## 2018-10-28 DIAGNOSIS — Z992 Dependence on renal dialysis: Secondary | ICD-10-CM | POA: Diagnosis not present

## 2018-10-28 DIAGNOSIS — E611 Iron deficiency: Secondary | ICD-10-CM | POA: Diagnosis not present

## 2018-10-28 DIAGNOSIS — N186 End stage renal disease: Secondary | ICD-10-CM | POA: Diagnosis not present

## 2018-10-28 DIAGNOSIS — D472 Monoclonal gammopathy: Secondary | ICD-10-CM | POA: Diagnosis not present

## 2018-10-28 DIAGNOSIS — D631 Anemia in chronic kidney disease: Secondary | ICD-10-CM | POA: Diagnosis not present

## 2018-10-29 DIAGNOSIS — M6281 Muscle weakness (generalized): Secondary | ICD-10-CM | POA: Diagnosis not present

## 2018-10-29 DIAGNOSIS — N186 End stage renal disease: Secondary | ICD-10-CM | POA: Diagnosis not present

## 2018-10-31 DIAGNOSIS — Z20828 Contact with and (suspected) exposure to other viral communicable diseases: Secondary | ICD-10-CM | POA: Diagnosis not present

## 2018-10-31 DIAGNOSIS — Z992 Dependence on renal dialysis: Secondary | ICD-10-CM | POA: Diagnosis not present

## 2018-10-31 DIAGNOSIS — N186 End stage renal disease: Secondary | ICD-10-CM | POA: Diagnosis not present

## 2018-10-31 DIAGNOSIS — N2581 Secondary hyperparathyroidism of renal origin: Secondary | ICD-10-CM | POA: Diagnosis not present

## 2018-10-31 DIAGNOSIS — Z1383 Encounter for screening for respiratory disorder NEC: Secondary | ICD-10-CM | POA: Diagnosis not present

## 2018-10-31 DIAGNOSIS — D631 Anemia in chronic kidney disease: Secondary | ICD-10-CM | POA: Diagnosis not present

## 2018-10-31 DIAGNOSIS — E611 Iron deficiency: Secondary | ICD-10-CM | POA: Diagnosis not present

## 2018-10-31 DIAGNOSIS — D472 Monoclonal gammopathy: Secondary | ICD-10-CM | POA: Diagnosis not present

## 2018-11-01 DIAGNOSIS — N2581 Secondary hyperparathyroidism of renal origin: Secondary | ICD-10-CM | POA: Diagnosis not present

## 2018-11-01 DIAGNOSIS — D631 Anemia in chronic kidney disease: Secondary | ICD-10-CM | POA: Diagnosis not present

## 2018-11-01 DIAGNOSIS — N186 End stage renal disease: Secondary | ICD-10-CM | POA: Diagnosis not present

## 2018-11-01 DIAGNOSIS — Z992 Dependence on renal dialysis: Secondary | ICD-10-CM | POA: Diagnosis not present

## 2018-11-01 DIAGNOSIS — M6281 Muscle weakness (generalized): Secondary | ICD-10-CM | POA: Diagnosis not present

## 2018-11-01 DIAGNOSIS — D472 Monoclonal gammopathy: Secondary | ICD-10-CM | POA: Diagnosis not present

## 2018-11-01 DIAGNOSIS — E611 Iron deficiency: Secondary | ICD-10-CM | POA: Diagnosis not present

## 2018-11-02 DIAGNOSIS — N186 End stage renal disease: Secondary | ICD-10-CM | POA: Diagnosis not present

## 2018-11-02 DIAGNOSIS — M6281 Muscle weakness (generalized): Secondary | ICD-10-CM | POA: Diagnosis not present

## 2018-11-04 DIAGNOSIS — D472 Monoclonal gammopathy: Secondary | ICD-10-CM | POA: Diagnosis not present

## 2018-11-04 DIAGNOSIS — D631 Anemia in chronic kidney disease: Secondary | ICD-10-CM | POA: Diagnosis not present

## 2018-11-04 DIAGNOSIS — N2581 Secondary hyperparathyroidism of renal origin: Secondary | ICD-10-CM | POA: Diagnosis not present

## 2018-11-04 DIAGNOSIS — N186 End stage renal disease: Secondary | ICD-10-CM | POA: Diagnosis not present

## 2018-11-04 DIAGNOSIS — Z992 Dependence on renal dialysis: Secondary | ICD-10-CM | POA: Diagnosis not present

## 2018-11-04 DIAGNOSIS — E611 Iron deficiency: Secondary | ICD-10-CM | POA: Diagnosis not present

## 2018-11-07 DIAGNOSIS — N186 End stage renal disease: Secondary | ICD-10-CM | POA: Diagnosis not present

## 2018-11-07 DIAGNOSIS — N2581 Secondary hyperparathyroidism of renal origin: Secondary | ICD-10-CM | POA: Diagnosis not present

## 2018-11-07 DIAGNOSIS — Z20828 Contact with and (suspected) exposure to other viral communicable diseases: Secondary | ICD-10-CM | POA: Diagnosis not present

## 2018-11-07 DIAGNOSIS — D631 Anemia in chronic kidney disease: Secondary | ICD-10-CM | POA: Diagnosis not present

## 2018-11-07 DIAGNOSIS — D472 Monoclonal gammopathy: Secondary | ICD-10-CM | POA: Diagnosis not present

## 2018-11-07 DIAGNOSIS — Z992 Dependence on renal dialysis: Secondary | ICD-10-CM | POA: Diagnosis not present

## 2018-11-07 DIAGNOSIS — E611 Iron deficiency: Secondary | ICD-10-CM | POA: Diagnosis not present

## 2018-11-07 DIAGNOSIS — Z1383 Encounter for screening for respiratory disorder NEC: Secondary | ICD-10-CM | POA: Diagnosis not present

## 2018-11-07 DIAGNOSIS — M6281 Muscle weakness (generalized): Secondary | ICD-10-CM | POA: Diagnosis not present

## 2018-11-08 DIAGNOSIS — N186 End stage renal disease: Secondary | ICD-10-CM | POA: Diagnosis not present

## 2018-11-08 DIAGNOSIS — M6281 Muscle weakness (generalized): Secondary | ICD-10-CM | POA: Diagnosis not present

## 2018-11-09 DIAGNOSIS — M6281 Muscle weakness (generalized): Secondary | ICD-10-CM | POA: Diagnosis not present

## 2018-11-09 DIAGNOSIS — N186 End stage renal disease: Secondary | ICD-10-CM | POA: Diagnosis not present

## 2018-11-10 DIAGNOSIS — F5101 Primary insomnia: Secondary | ICD-10-CM | POA: Diagnosis not present

## 2018-11-10 DIAGNOSIS — F331 Major depressive disorder, recurrent, moderate: Secondary | ICD-10-CM | POA: Diagnosis not present

## 2018-11-10 DIAGNOSIS — N186 End stage renal disease: Secondary | ICD-10-CM | POA: Diagnosis not present

## 2018-11-10 DIAGNOSIS — M6281 Muscle weakness (generalized): Secondary | ICD-10-CM | POA: Diagnosis not present

## 2018-11-11 DIAGNOSIS — D472 Monoclonal gammopathy: Secondary | ICD-10-CM | POA: Diagnosis not present

## 2018-11-11 DIAGNOSIS — D631 Anemia in chronic kidney disease: Secondary | ICD-10-CM | POA: Diagnosis not present

## 2018-11-11 DIAGNOSIS — N186 End stage renal disease: Secondary | ICD-10-CM | POA: Diagnosis not present

## 2018-11-11 DIAGNOSIS — E611 Iron deficiency: Secondary | ICD-10-CM | POA: Diagnosis not present

## 2018-11-11 DIAGNOSIS — Z992 Dependence on renal dialysis: Secondary | ICD-10-CM | POA: Diagnosis not present

## 2018-11-11 DIAGNOSIS — N2581 Secondary hyperparathyroidism of renal origin: Secondary | ICD-10-CM | POA: Diagnosis not present

## 2018-11-11 DIAGNOSIS — M6281 Muscle weakness (generalized): Secondary | ICD-10-CM | POA: Diagnosis not present

## 2018-11-14 DIAGNOSIS — N186 End stage renal disease: Secondary | ICD-10-CM | POA: Diagnosis not present

## 2018-11-14 DIAGNOSIS — D631 Anemia in chronic kidney disease: Secondary | ICD-10-CM | POA: Diagnosis not present

## 2018-11-14 DIAGNOSIS — N2581 Secondary hyperparathyroidism of renal origin: Secondary | ICD-10-CM | POA: Diagnosis not present

## 2018-11-14 DIAGNOSIS — M6281 Muscle weakness (generalized): Secondary | ICD-10-CM | POA: Diagnosis not present

## 2018-11-14 DIAGNOSIS — E611 Iron deficiency: Secondary | ICD-10-CM | POA: Diagnosis not present

## 2018-11-14 DIAGNOSIS — Z1383 Encounter for screening for respiratory disorder NEC: Secondary | ICD-10-CM | POA: Diagnosis not present

## 2018-11-14 DIAGNOSIS — D472 Monoclonal gammopathy: Secondary | ICD-10-CM | POA: Diagnosis not present

## 2018-11-14 DIAGNOSIS — Z992 Dependence on renal dialysis: Secondary | ICD-10-CM | POA: Diagnosis not present

## 2018-11-14 DIAGNOSIS — Z20828 Contact with and (suspected) exposure to other viral communicable diseases: Secondary | ICD-10-CM | POA: Diagnosis not present

## 2018-11-15 DIAGNOSIS — G8929 Other chronic pain: Secondary | ICD-10-CM | POA: Diagnosis not present

## 2018-11-15 DIAGNOSIS — Z992 Dependence on renal dialysis: Secondary | ICD-10-CM | POA: Diagnosis not present

## 2018-11-15 DIAGNOSIS — D61818 Other pancytopenia: Secondary | ICD-10-CM | POA: Diagnosis not present

## 2018-11-15 DIAGNOSIS — Z8673 Personal history of transient ischemic attack (TIA), and cerebral infarction without residual deficits: Secondary | ICD-10-CM | POA: Diagnosis not present

## 2018-11-15 DIAGNOSIS — K219 Gastro-esophageal reflux disease without esophagitis: Secondary | ICD-10-CM | POA: Diagnosis not present

## 2018-11-15 DIAGNOSIS — R4189 Other symptoms and signs involving cognitive functions and awareness: Secondary | ICD-10-CM | POA: Diagnosis not present

## 2018-11-15 DIAGNOSIS — M6281 Muscle weakness (generalized): Secondary | ICD-10-CM | POA: Diagnosis not present

## 2018-11-15 DIAGNOSIS — B20 Human immunodeficiency virus [HIV] disease: Secondary | ICD-10-CM | POA: Diagnosis not present

## 2018-11-15 DIAGNOSIS — F329 Major depressive disorder, single episode, unspecified: Secondary | ICD-10-CM | POA: Diagnosis not present

## 2018-11-15 DIAGNOSIS — N186 End stage renal disease: Secondary | ICD-10-CM | POA: Diagnosis not present

## 2018-11-16 DIAGNOSIS — M6281 Muscle weakness (generalized): Secondary | ICD-10-CM | POA: Diagnosis not present

## 2018-11-16 DIAGNOSIS — N186 End stage renal disease: Secondary | ICD-10-CM | POA: Diagnosis not present

## 2018-11-17 DIAGNOSIS — N186 End stage renal disease: Secondary | ICD-10-CM | POA: Diagnosis not present

## 2018-11-17 DIAGNOSIS — M6281 Muscle weakness (generalized): Secondary | ICD-10-CM | POA: Diagnosis not present

## 2018-11-18 DIAGNOSIS — D472 Monoclonal gammopathy: Secondary | ICD-10-CM | POA: Diagnosis not present

## 2018-11-18 DIAGNOSIS — N186 End stage renal disease: Secondary | ICD-10-CM | POA: Diagnosis not present

## 2018-11-18 DIAGNOSIS — D631 Anemia in chronic kidney disease: Secondary | ICD-10-CM | POA: Diagnosis not present

## 2018-11-18 DIAGNOSIS — N2581 Secondary hyperparathyroidism of renal origin: Secondary | ICD-10-CM | POA: Diagnosis not present

## 2018-11-18 DIAGNOSIS — Z992 Dependence on renal dialysis: Secondary | ICD-10-CM | POA: Diagnosis not present

## 2018-11-18 DIAGNOSIS — E611 Iron deficiency: Secondary | ICD-10-CM | POA: Diagnosis not present

## 2018-11-21 DIAGNOSIS — E611 Iron deficiency: Secondary | ICD-10-CM | POA: Diagnosis not present

## 2018-11-21 DIAGNOSIS — Z992 Dependence on renal dialysis: Secondary | ICD-10-CM | POA: Diagnosis not present

## 2018-11-21 DIAGNOSIS — Z20828 Contact with and (suspected) exposure to other viral communicable diseases: Secondary | ICD-10-CM | POA: Diagnosis not present

## 2018-11-21 DIAGNOSIS — D472 Monoclonal gammopathy: Secondary | ICD-10-CM | POA: Diagnosis not present

## 2018-11-21 DIAGNOSIS — N186 End stage renal disease: Secondary | ICD-10-CM | POA: Diagnosis not present

## 2018-11-21 DIAGNOSIS — D631 Anemia in chronic kidney disease: Secondary | ICD-10-CM | POA: Diagnosis not present

## 2018-11-21 DIAGNOSIS — N2581 Secondary hyperparathyroidism of renal origin: Secondary | ICD-10-CM | POA: Diagnosis not present

## 2018-11-21 DIAGNOSIS — Z1383 Encounter for screening for respiratory disorder NEC: Secondary | ICD-10-CM | POA: Diagnosis not present

## 2018-11-22 DIAGNOSIS — Z20828 Contact with and (suspected) exposure to other viral communicable diseases: Secondary | ICD-10-CM | POA: Diagnosis not present

## 2018-11-22 DIAGNOSIS — M6281 Muscle weakness (generalized): Secondary | ICD-10-CM | POA: Diagnosis not present

## 2018-11-22 DIAGNOSIS — N186 End stage renal disease: Secondary | ICD-10-CM | POA: Diagnosis not present

## 2018-11-22 DIAGNOSIS — B20 Human immunodeficiency virus [HIV] disease: Secondary | ICD-10-CM | POA: Diagnosis not present

## 2018-11-23 DIAGNOSIS — N186 End stage renal disease: Secondary | ICD-10-CM | POA: Diagnosis not present

## 2018-11-23 DIAGNOSIS — M6281 Muscle weakness (generalized): Secondary | ICD-10-CM | POA: Diagnosis not present

## 2018-11-25 DIAGNOSIS — M6281 Muscle weakness (generalized): Secondary | ICD-10-CM | POA: Diagnosis not present

## 2018-11-25 DIAGNOSIS — D472 Monoclonal gammopathy: Secondary | ICD-10-CM | POA: Diagnosis not present

## 2018-11-25 DIAGNOSIS — D631 Anemia in chronic kidney disease: Secondary | ICD-10-CM | POA: Diagnosis not present

## 2018-11-25 DIAGNOSIS — Z992 Dependence on renal dialysis: Secondary | ICD-10-CM | POA: Diagnosis not present

## 2018-11-25 DIAGNOSIS — N2581 Secondary hyperparathyroidism of renal origin: Secondary | ICD-10-CM | POA: Diagnosis not present

## 2018-11-25 DIAGNOSIS — N186 End stage renal disease: Secondary | ICD-10-CM | POA: Diagnosis not present

## 2018-11-25 DIAGNOSIS — E611 Iron deficiency: Secondary | ICD-10-CM | POA: Diagnosis not present

## 2018-11-28 DIAGNOSIS — M6281 Muscle weakness (generalized): Secondary | ICD-10-CM | POA: Diagnosis not present

## 2018-11-28 DIAGNOSIS — N186 End stage renal disease: Secondary | ICD-10-CM | POA: Diagnosis not present

## 2018-11-28 DIAGNOSIS — N2581 Secondary hyperparathyroidism of renal origin: Secondary | ICD-10-CM | POA: Diagnosis not present

## 2018-11-28 DIAGNOSIS — D472 Monoclonal gammopathy: Secondary | ICD-10-CM | POA: Diagnosis not present

## 2018-11-28 DIAGNOSIS — Z992 Dependence on renal dialysis: Secondary | ICD-10-CM | POA: Diagnosis not present

## 2018-11-28 DIAGNOSIS — Z20828 Contact with and (suspected) exposure to other viral communicable diseases: Secondary | ICD-10-CM | POA: Diagnosis not present

## 2018-11-28 DIAGNOSIS — Z1383 Encounter for screening for respiratory disorder NEC: Secondary | ICD-10-CM | POA: Diagnosis not present

## 2018-11-28 DIAGNOSIS — E611 Iron deficiency: Secondary | ICD-10-CM | POA: Diagnosis not present

## 2018-11-28 DIAGNOSIS — D631 Anemia in chronic kidney disease: Secondary | ICD-10-CM | POA: Diagnosis not present

## 2018-11-29 DIAGNOSIS — N186 End stage renal disease: Secondary | ICD-10-CM | POA: Diagnosis not present

## 2018-11-29 DIAGNOSIS — M6281 Muscle weakness (generalized): Secondary | ICD-10-CM | POA: Diagnosis not present

## 2018-11-30 DIAGNOSIS — M6281 Muscle weakness (generalized): Secondary | ICD-10-CM | POA: Diagnosis not present

## 2018-11-30 DIAGNOSIS — Z992 Dependence on renal dialysis: Secondary | ICD-10-CM | POA: Diagnosis not present

## 2018-11-30 DIAGNOSIS — N186 End stage renal disease: Secondary | ICD-10-CM | POA: Diagnosis not present

## 2018-12-01 DIAGNOSIS — D631 Anemia in chronic kidney disease: Secondary | ICD-10-CM | POA: Diagnosis not present

## 2018-12-01 DIAGNOSIS — D472 Monoclonal gammopathy: Secondary | ICD-10-CM | POA: Diagnosis not present

## 2018-12-01 DIAGNOSIS — N2581 Secondary hyperparathyroidism of renal origin: Secondary | ICD-10-CM | POA: Diagnosis not present

## 2018-12-01 DIAGNOSIS — Z23 Encounter for immunization: Secondary | ICD-10-CM | POA: Diagnosis not present

## 2018-12-01 DIAGNOSIS — D509 Iron deficiency anemia, unspecified: Secondary | ICD-10-CM | POA: Diagnosis not present

## 2018-12-01 DIAGNOSIS — M6281 Muscle weakness (generalized): Secondary | ICD-10-CM | POA: Diagnosis not present

## 2018-12-01 DIAGNOSIS — E611 Iron deficiency: Secondary | ICD-10-CM | POA: Diagnosis not present

## 2018-12-01 DIAGNOSIS — Z992 Dependence on renal dialysis: Secondary | ICD-10-CM | POA: Diagnosis not present

## 2018-12-01 DIAGNOSIS — N186 End stage renal disease: Secondary | ICD-10-CM | POA: Diagnosis not present

## 2018-12-02 DIAGNOSIS — Z23 Encounter for immunization: Secondary | ICD-10-CM | POA: Diagnosis not present

## 2018-12-02 DIAGNOSIS — D472 Monoclonal gammopathy: Secondary | ICD-10-CM | POA: Diagnosis not present

## 2018-12-02 DIAGNOSIS — N2581 Secondary hyperparathyroidism of renal origin: Secondary | ICD-10-CM | POA: Diagnosis not present

## 2018-12-02 DIAGNOSIS — D509 Iron deficiency anemia, unspecified: Secondary | ICD-10-CM | POA: Diagnosis not present

## 2018-12-02 DIAGNOSIS — M6281 Muscle weakness (generalized): Secondary | ICD-10-CM | POA: Diagnosis not present

## 2018-12-02 DIAGNOSIS — E611 Iron deficiency: Secondary | ICD-10-CM | POA: Diagnosis not present

## 2018-12-02 DIAGNOSIS — N186 End stage renal disease: Secondary | ICD-10-CM | POA: Diagnosis not present

## 2018-12-05 DIAGNOSIS — Z23 Encounter for immunization: Secondary | ICD-10-CM | POA: Diagnosis not present

## 2018-12-05 DIAGNOSIS — D509 Iron deficiency anemia, unspecified: Secondary | ICD-10-CM | POA: Diagnosis not present

## 2018-12-05 DIAGNOSIS — E611 Iron deficiency: Secondary | ICD-10-CM | POA: Diagnosis not present

## 2018-12-05 DIAGNOSIS — Z1383 Encounter for screening for respiratory disorder NEC: Secondary | ICD-10-CM | POA: Diagnosis not present

## 2018-12-05 DIAGNOSIS — Z20828 Contact with and (suspected) exposure to other viral communicable diseases: Secondary | ICD-10-CM | POA: Diagnosis not present

## 2018-12-05 DIAGNOSIS — N186 End stage renal disease: Secondary | ICD-10-CM | POA: Diagnosis not present

## 2018-12-05 DIAGNOSIS — N2581 Secondary hyperparathyroidism of renal origin: Secondary | ICD-10-CM | POA: Diagnosis not present

## 2018-12-05 DIAGNOSIS — D472 Monoclonal gammopathy: Secondary | ICD-10-CM | POA: Diagnosis not present

## 2018-12-05 DIAGNOSIS — M6281 Muscle weakness (generalized): Secondary | ICD-10-CM | POA: Diagnosis not present

## 2018-12-06 DIAGNOSIS — N186 End stage renal disease: Secondary | ICD-10-CM | POA: Diagnosis not present

## 2018-12-06 DIAGNOSIS — M6281 Muscle weakness (generalized): Secondary | ICD-10-CM | POA: Diagnosis not present

## 2018-12-07 DIAGNOSIS — F331 Major depressive disorder, recurrent, moderate: Secondary | ICD-10-CM | POA: Diagnosis not present

## 2018-12-07 DIAGNOSIS — D472 Monoclonal gammopathy: Secondary | ICD-10-CM | POA: Diagnosis not present

## 2018-12-07 DIAGNOSIS — Z23 Encounter for immunization: Secondary | ICD-10-CM | POA: Diagnosis not present

## 2018-12-07 DIAGNOSIS — F5101 Primary insomnia: Secondary | ICD-10-CM | POA: Diagnosis not present

## 2018-12-07 DIAGNOSIS — D509 Iron deficiency anemia, unspecified: Secondary | ICD-10-CM | POA: Diagnosis not present

## 2018-12-07 DIAGNOSIS — E611 Iron deficiency: Secondary | ICD-10-CM | POA: Diagnosis not present

## 2018-12-07 DIAGNOSIS — N186 End stage renal disease: Secondary | ICD-10-CM | POA: Diagnosis not present

## 2018-12-07 DIAGNOSIS — N2581 Secondary hyperparathyroidism of renal origin: Secondary | ICD-10-CM | POA: Diagnosis not present

## 2018-12-08 ENCOUNTER — Inpatient Hospital Stay (HOSPITAL_COMMUNITY): Payer: Medicare Other | Attending: Hematology | Admitting: Hematology

## 2018-12-08 ENCOUNTER — Inpatient Hospital Stay (HOSPITAL_COMMUNITY): Payer: Medicare Other

## 2018-12-08 ENCOUNTER — Encounter (HOSPITAL_COMMUNITY): Payer: Self-pay | Admitting: Hematology

## 2018-12-08 ENCOUNTER — Other Ambulatory Visit: Payer: Self-pay

## 2018-12-08 ENCOUNTER — Other Ambulatory Visit (HOSPITAL_COMMUNITY): Payer: Self-pay

## 2018-12-08 VITALS — BP 169/110 | HR 65 | Temp 98.7°F | Resp 18 | Wt 104.1 lb

## 2018-12-08 DIAGNOSIS — Z21 Asymptomatic human immunodeficiency virus [HIV] infection status: Secondary | ICD-10-CM | POA: Insufficient documentation

## 2018-12-08 DIAGNOSIS — Z992 Dependence on renal dialysis: Secondary | ICD-10-CM | POA: Insufficient documentation

## 2018-12-08 DIAGNOSIS — Z87891 Personal history of nicotine dependence: Secondary | ICD-10-CM | POA: Diagnosis not present

## 2018-12-08 DIAGNOSIS — N186 End stage renal disease: Secondary | ICD-10-CM | POA: Diagnosis not present

## 2018-12-08 DIAGNOSIS — D61818 Other pancytopenia: Secondary | ICD-10-CM | POA: Diagnosis not present

## 2018-12-08 DIAGNOSIS — G8929 Other chronic pain: Secondary | ICD-10-CM | POA: Diagnosis not present

## 2018-12-08 LAB — CBC WITH DIFFERENTIAL/PLATELET
Abs Immature Granulocytes: 0.01 10*3/uL (ref 0.00–0.07)
Basophils Absolute: 0 10*3/uL (ref 0.0–0.1)
Basophils Relative: 0 %
Eosinophils Absolute: 0.1 10*3/uL (ref 0.0–0.5)
Eosinophils Relative: 2 %
HCT: 35.5 % — ABNORMAL LOW (ref 36.0–46.0)
Hemoglobin: 10.8 g/dL — ABNORMAL LOW (ref 12.0–15.0)
Immature Granulocytes: 0 %
Lymphocytes Relative: 28 %
Lymphs Abs: 1 10*3/uL (ref 0.7–4.0)
MCH: 34.5 pg — ABNORMAL HIGH (ref 26.0–34.0)
MCHC: 30.4 g/dL (ref 30.0–36.0)
MCV: 113.4 fL — ABNORMAL HIGH (ref 80.0–100.0)
Monocytes Absolute: 0.3 10*3/uL (ref 0.1–1.0)
Monocytes Relative: 10 %
Neutro Abs: 2 10*3/uL (ref 1.7–7.7)
Neutrophils Relative %: 60 %
Platelets: 172 10*3/uL (ref 150–400)
RBC: 3.13 MIL/uL — ABNORMAL LOW (ref 3.87–5.11)
RDW: 14.6 % (ref 11.5–15.5)
WBC: 3.4 10*3/uL — ABNORMAL LOW (ref 4.0–10.5)
nRBC: 0 % (ref 0.0–0.2)

## 2018-12-08 LAB — VITAMIN B12: Vitamin B-12: 953 pg/mL — ABNORMAL HIGH (ref 180–914)

## 2018-12-08 LAB — FOLATE: Folate: 59.5 ng/mL (ref >5.9)

## 2018-12-08 LAB — HEPATITIS B SURFACE ANTIGEN: Hepatitis B Surface Ag: NONREACTIVE

## 2018-12-08 LAB — RETICULOCYTES
Immature Retic Fract: 14.8 % (ref 2.3–15.9)
RBC.: 3.13 MIL/uL — ABNORMAL LOW (ref 3.87–5.11)
Retic Count, Absolute: 47.9 10*3/uL (ref 19.0–186.0)
Retic Ct Pct: 1.5 % (ref 0.4–3.1)

## 2018-12-08 LAB — HEPATITIS B SURFACE ANTIBODY,QUALITATIVE: Hep B S Ab: REACTIVE — AB

## 2018-12-08 LAB — LACTATE DEHYDROGENASE: LDH: 127 U/L (ref 98–192)

## 2018-12-08 LAB — HEPATITIS B CORE ANTIBODY, TOTAL: Hep B Core Total Ab: NONREACTIVE

## 2018-12-08 NOTE — Assessment & Plan Note (Addendum)
1.  Pancytopenia: - CBC on 09/20/2018 shows white count of 2.6, hemoglobin 9.4, MCV 100, platelet count 128.  Differential showed 44% neutrophils, 45% lymphocytes, 9% monocytes and 2% eosinophils. -He does not report any fevers, night sweats.  She reports some weight loss but cannot quantify it. - No new medications other than sertraline which was started on 12/07/2018. -No recent infections or hospitalizations noted. - She has been on antiretrovirals for a long time.  Denies any prior history of blood transfusions.  No prior history of IV drug abuse. -Medical records show that she had a bone marrow biopsy on 03/20/2008 which showed variably cellular marrow with trilineage hematopoiesis.  Plasma cells are increased representing 26% of the cells.  Staining show polyclonal pattern. - She reported tiredness in the past few weeks. - We will repeat her CBC with differential and review her smear.  Will check LDH and reticulocyte count.  We will rule out nutritional deficiencies by checking B12, folic acid, methylmalonic acid, copper.  We will also check ferritin, and iron panel.  We will also check for infectious etiologies including CMV, EBV, parvo B19 and hepatitis panel.  We will follow-up on the CD4 count and viral load sent at nursing home. -We will see her back in 3 weeks for follow-up.  If stable tests are nonconclusive, will consider bone marrow aspiration biopsy.  2.  HIV: - She has been on Epivir 50 mg daily, Retrovir 100 mg 3 times daily and Tivicay 50 mg daily. -We will follow-up on CD4 count and viral load sent at nursing home.  3.  ESRD: -She undergoes hemodialysis on Mondays, Wednesdays and Fridays.  4.  Chronic pain: -She is on fentanyl 50 mcg and oxycodone 10 mg twice daily as needed.   

## 2018-12-08 NOTE — Progress Notes (Signed)
CONSULT NOTE  Patient Care Team: Hilbert Corrigan, MD as PCP - General (Internal Medicine) Johnnye Sima Doroteo Bradford, MD as PCP - Infectious Diseases (Infectious Diseases) Center, [facility]  CHIEF COMPLAINTS/PURPOSE OF CONSULTATION:  Pancytopenia.  HISTORY OF PRESENTING ILLNESS:  [name] 68 y.o. female is seen for evaluation of pancytopenia at the request of Dr. Mal Amabile from [facility].  CBC dated 09/20/2018 shows white count of 2.6, hemoglobin 9.4, MCV 100, platelet count 128.  Differential showed 44% neutrophils, 45% lymphocytes, 9% monocytes, 2% eosinophils.  Absolute neutrophil count is 1100.  She has some chronic tingling and numbness in the hands.  Appetite and energy levels are reported as 25%.  She does report recent tiredness and fatigue.  Denies any fevers or night sweats but has some weight loss, cannot quantify it.  She reports that she had been on hemodialysis since young age.  She does have hemodialysis on Mondays, Wednesdays and Fridays.  Denies any prior history of transfusion.  Denies any previous history of IV drug abuse.  Patient also has been on retrovirals for HIV, fairly well controlled.  Only new medication was sertraline 125 mg daily was started on 12/07/2018.  Denies any recurrent infections or hospitalizations.  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  She quit smoking many many years ago.  Denies drinking any alcohol.  She does not know any of her family history.  She uses wheelchair or walker for ambulation.  Patient had a bone marrow biopsy on 03/20/2008.    MEDICAL HISTORY:  Past Medical History:  Diagnosis Date  . Anemia   . Anxiety   . Atrial flutter ([place]) 02/22/2015  . C. difficile colitis 10/10/11  . Chronic diarrhea   . Depression   . ESRD on hemodialysis ([place])   . GERD (gastroesophageal reflux disease)   . HIV (human immunodeficiency virus infection) ([place])   . Hypertension   .  Insomnia   . Intracranial hemorrhage ([place])   . Muscle weakness (generalized)   . Pulmonary HTN ([place])   . Tachycardia   . TIA (transient ischemic attack)   . Traumatic hematoma of right upper arm 02/22/2015    SURGICAL HISTORY: Past Surgical History:  Procedure Laterality Date  . A/V FISTULAGRAM N/A 04/17/2016   Procedure: A/V Fistulagram - Right Upper;  Surgeon: Waynetta Sandy, MD;  Location: [facility];  Service: Cardiovascular;  Laterality: N/A;  . BIOPSY THYROID    . Dialysis Shunts     previous one removed from left arm and now present one in right arm  . DIALYSIS/PERMA CATHETER INSERTION Right 04/17/2016   Procedure: dialysis Catheter Insertion central veinous;  Surgeon: Waynetta Sandy, MD;  Location: [facility];  Service: Cardiovascular;  Laterality: Right;  . ESOPHAGOGASTRODUODENOSCOPY  12/15/2010   Procedure: ESOPHAGOGASTRODUODENOSCOPY (EGD);  Surgeon: Daneil Dolin, MD;  [facility];  Service: Endoscopy;  Laterality: N/A;  . EXCISION OF BREAST BIOPSY Right 05/04/2012   Procedure: EXCISION OF BREAST BIOPSY;  Surgeon: Donato Heinz, MD;  Location: [facility];  Service: General;  Laterality: Right;  Right Excisional Breast Biopsy  . FISTULOGRAM Right 03/26/2014   Procedure: Right Arm Fistulogram with Venoplasty Right Subclavian Vein and Inominate Vein. Debridement Fistula Ulcer;  Surgeon: Conrad Carey, MD;  Location: [facility];  Service: Vascular;  Laterality: Right;  . FISTULOGRAM Right 03/29/2017   Procedure: FISTULOGRAM COMPLEX RIGHT ARM with Balloon angioplasty;  Surgeon: Waynetta Sandy, MD;  Location: [facility];  Service: Vascular;  Laterality: Right;  . IR GENERIC HISTORICAL  05/19/2016   IR REMOVAL TUN CV CATH W/O FL 05/19/2016 Markus Daft, MD [facility]-INTER[facility] RAD  . PERIPHERAL VASCULAR BALLOON ANGIOPLASTY Right 04/17/2016   Procedure: Peripheral Vascular Balloon Angioplasty;  Surgeon: Waynetta Sandy, MD;  Location: [facility];  Service: Cardiovascular;  Laterality: Right;  Central segment AV Fistula  . PERIPHERAL VASCULAR BALLOON ANGIOPLASTY Right 03/14/2018   Procedure: PERIPHERAL VASCULAR BALLOON ANGIOPLASTY;  Surgeon: Waynetta Sandy, MD;  Location: [facility];  Service: Cardiovascular;  Laterality: Right;  subclavian vein  . PERIPHERAL VASCULAR INTERVENTION Right 03/14/2018   Procedure: PERIPHERAL VASCULAR INTERVENTION;  Surgeon: Waynetta Sandy, MD;  Location: [facility];  Service: Cardiovascular;  Laterality: Right;  subclavian vein  . SHUNTOGRAM Right 10/19/2013   Procedure: FISTULOGRAM;  Surgeon: Conrad Success, MD;  Location: [facility] C[facility] LAB;  Service: Cardiovascular;  Laterality: Right;  . TONSILLECTOMY    . VISCERAL VENOGRAPHY Right 03/14/2018   Procedure: CENTRAL VENOGRAPHY;  Surgeon: Waynetta Sandy, MD;  Location: [facility];  Service: Cardiovascular;  Laterality: Right;  upper ext fistula    SOCIAL HISTORY: Social History   Socioeconomic History  . Marital status: Widowed    Spouse name: Not on file  . Number of children: 1  . Years of education: Not on file  . Highest education level: Not on file  Occupational History  . Not on file  Social Needs  . Financial resource strain: Not on file  . Food insecurity    Worry: Not on file    Inability: Not on file  . Transportation needs    Medical: Not on file    Non-medical: Not on file  Tobacco Use  . Smoking status: Former Smoker    Quit date: 03/12/1983    Years since quitting: 35.7  . Smokeless tobacco: Never Used  Substance and Sexual Activity  . Alcohol use: No    Alcohol/week: 0.0 standard drinks  . Drug use: No  . Sexual activity: Not Currently  Lifestyle  . Physical activity    Days per week: Not on file    Minutes per session: Not on file  . Stress: Not on file  Relationships  . Social Herbalist on phone: Not on file    Gets together: Not on file    Attends  religious service: Not on file    Active member of club or organization: Not on file    Attends meetings of clubs or organizations: Not on file    Relationship status: Not on file  . Intimate partner violence    Fear of current or ex partner: Not on file    Emotionally abused: Not on file    Physically abused: Not on file    Forced sexual activity: Not on file  Other Topics Concern  . Not on file  Social History Narrative  . Not on file    FAMILY HISTORY: Family History  Problem Relation Age of Onset  . Anesthesia problems Neg Hx   . Hypotension Neg Hx   . Malignant hyperthermia Neg Hx   . Pseudochol deficiency Neg Hx     ALLERGIES:  is allergic to lactose intolerance (gi); latex; and penicillins.  MEDICATIONS:  Current Outpatient Medications  Medication Sig Dispense Refill  . Biotin 5000 MCG CAPS Take by mouth.    Marland Kitchen  cinacalcet (SENSIPAR) 30 MG tablet Take 60 mg by mouth at bedtime.     . clopidogrel (PLAVIX) 75 MG tablet Take 1 tablet (75 mg total) by mouth daily. 30 tablet 3  . diltiazem (CARDIZEM) 60 MG tablet Take 60 mg by mouth 2 (two) times daily.    . dolutegravir (TIVICAY) 50 MG tablet Take 50 mg by mouth daily.    . fentaNYL (DURAGESIC - DOSED MCG/HR) 50 MCG/HR Apply one patch every 72 hours for pain. Remove old patch. External use only. Rotate sites. (Patient taking differently: Place 50 mcg onto the skin every 3 (three) days. Remove old patch. External use only. Rotate sites.) 10 patch 0  . ipratropium-albuterol (DUONEB) 0.5-2.5 (3) MG/3ML SOLN     . lamivudine (EPIVIR) 100 MG tablet Take 50 mg by mouth daily.    . metoprolol (LOPRESSOR) 50 MG tablet Take 50 mg by mouth 2 (two) times daily.    . multivitamin (RENA-VIT) TABS tablet Take 1 tablet by mouth at bedtime.    . Nutritional Supplements (NOVASOURCE RENAL) LIQD Take 237 mLs by mouth 3 (three) times daily.    . Oxycodone HCl 10 MG TABS Take 10 mg by mouth every 12 (twelve) hours as needed (severe pain).    Marland Kitchen  sertraline (ZOLOFT) 25 MG tablet Take 25 mg by mouth daily. Take 25 mg with the 100 mg.    . SERTRALINE HCL PO Take 100 mg by mouth daily.     . sevelamer carbonate (RENVELA) 800 MG tablet Take 2,400 mg by mouth 3 (three) times daily with meals.     . sildenafil (REVATIO) 20 MG tablet Take 20 mg by mouth 3 (three) times daily.     . vitamin C (ASCORBIC ACID) 500 MG tablet Take 500 mg by mouth 2 (two) times daily.    . zaleplon (SONATA) 5 MG capsule Take one capsule by mouth every night at bedtime for rest (Patient taking differently: Take 5 mg by mouth at bedtime. ) 30 capsule 0  . zidovudine (RETROVIR) 100 MG capsule Take 100 mg by mouth 3 (three) times daily.      Marland Kitchen acetaminophen (TYLENOL) 325 MG tablet Take 650 mg by mouth every 4 (four) hours as needed for mild pain or fever.    Marland Kitchen alum & mag hydroxide-simeth (MAALOX/MYLANTA) 200-200-20 MG/5ML suspension Take 30 mLs by mouth every 2 (two) hours as needed for indigestion or heartburn. Max 4 doses per 24 hours    . bisacodyl (FLEET) 10 MG/30ML ENEM Place 10 mg rectally daily as needed (constipation).    Marland Kitchen glucagon (GLUCAGON EMERGENCY) 1 MG injection Inject 1 mg into the vein once as needed (if bs is < 60 and pt is unconscious).    Marland Kitchen lidocaine-prilocaine (EMLA) cream Apply 1 application topically 3 (three) times a week. Apply 1 hour prior to dialysis access site on Monday Wednesday and Friday    . ondansetron (ZOFRAN-ODT) 4 MG disintegrating tablet Take 4 mg by mouth every 4 (four) hours as needed for nausea or vomiting. If not resolved after 2 doses then take promethazine 25 mg    . promethazine (PHENERGAN) 12.5 MG tablet Take 12.5 mg by mouth every Monday, Wednesday, and Friday with hemodialysis. May take an additional 12.5 mg dose every 6 hours as needed for nausea     Current Facility-Administered Medications  Medication Dose Route Frequency Provider Last Rate Last Dose  . clopidogrel (PLAVIX) tablet 300 mg  300 mg Oral Once Waynetta Sandy, MD  REVIEW OF SYSTEMS:   Constitutional: Denies fevers, chills or abnormal night sweats.  Positive for headaches. Eyes: Denies blurriness of vision, double vision or watery eyes Ears, nose, mouth, throat, and face: Denies mucositis or sore throat Respiratory: Denies cough, dyspnea or wheezes Cardiovascular: Denies palpitation, chest discomfort or lower extremity swelling Gastrointestinal:  Denies nausea, heartburn or change in bowel habits Skin: Denies abnormal skin rashes Lymphatics: Denies new lymphadenopathy or easy bruising Neurological: Mild numbness in the hands present.  Positive for headache. Behavioral/Psych: Mood is stable, no new changes  All other systems were reviewed with the patient and are negative.  PHYSICAL EXAMINATION: ECOG PERFORMANCE STATUS: 2 - Symptomatic, <50% confined to bed  Vitals:   12/08/18 1349  BP: (!) 169/110  Pulse: 65  Resp: 18  Temp: 98.7 F (37.1 C)  SpO2: 97%   Filed Weights   12/08/18 1349  Weight: 104 lb 1.6 oz (47.2 kg)    GENERAL:alert, no distress and comfortable SKIN: skin color, texture, turgor are normal, no rashes or significant lesions EYES: normal, conjunctiva are pink and non-injected, sclera clear OROPHARYNX:no exudate, no erythema and lips, buccal mucosa, and tongue normal  NECK: supple, thyroid normal size, non-tender, without nodularity LYMPH:  no palpable lymphadenopathy in the cervical, axillary or inguinal LUNGS: clear to auscultation and percussion with normal breathing effort HEART: regular rate & rhythm and no murmurs and no lower extremity edema ABDOMEN:abdomen soft, non-tender and normal bowel sounds Musculoskeletal:no cyanosis of digits and no clubbing  PSYCH: alert & oriented x 3 with fluent speech NEURO: no focal motor/sensory deficits.  Left arm and right forearm fistula present.  LABORATORY DATA:  I have reviewed the data as listed Recent Results (from the past 2160 hour(s))  CBC with  Differential     Status: Abnormal   Collection Time: 12/08/18  2:36 PM  Result Value Ref Range   WBC 3.4 (L) 4.0 - 10.5 K/uL   RBC 3.13 (L) 3.87 - 5.11 MIL/uL   Hemoglobin 10.8 (L) 12.0 - 15.0 g/dL   HCT 35.5 (L) 36.0 - 46.0 %   MCV 113.4 (H) 80.0 - 100.0 fL   MCH 34.5 (H) 26.0 - 34.0 pg   MCHC 30.4 30.0 - 36.0 g/dL   RDW 14.6 11.5 - 15.5 %   Platelets 172 150 - 400 K/uL   nRBC 0.0 0.0 - 0.2 %   Neutrophils Relative % 60 %   Neutro Abs 2.0 1.7 - 7.7 K/uL   Lymphocytes Relative 28 %   Lymphs Abs 1.0 0.7 - 4.0 K/uL   Monocytes Relative 10 %   Monocytes Absolute 0.3 0.1 - 1.0 K/uL   Eosinophils Relative 2 %   Eosinophils Absolute 0.1 0.0 - 0.5 K/uL   Basophils Relative 0 %   Basophils Absolute 0.0 0.0 - 0.1 K/uL   RBC Morphology MACROCYTOSIS    Immature Granulocytes 0 %   Abs Immature Granulocytes 0.01 0.00 - 0.07 K/uL    Comment: Performed at [facility], [address], [place], [place] [address]  Reticulocytes     Status: Abnormal   Collection Time: 12/08/18  2:36 PM  Result Value Ref Range   Retic Ct Pct 1.5 0.4 - 3.1 %   RBC. 3.13 (L) 3.87 - 5.11 MIL/uL   Retic Count, Absolute 47.9 19.0 - 186.0 K/uL   Immature Retic Fract 14.8 2.3 - 15.9 %    Comment: Performed at [facility], [address], [place], [place] [address]  Lactate dehydrogenase  Status: None   Collection Time: 12/08/18  2:36 PM  Result Value Ref Range   LDH 127 98 - 192 U/L    Comment: Performed at [facility], [address] [place], [place] [address]    RADIOGRAPHIC STUDIES: I have personally reviewed the radiological images as listed and agreed with the findings in the report.  ASSESSMENT & PLAN:  Other pancytopenia ([place]) 1.  Pancytopenia: - CBC on 09/20/2018 shows white count of 2.6, hemoglobin 9.4, MCV 100, platelet count 128.  Differential showed 44% neutrophils, 45% lymphocytes, 9% monocytes and 2% eosinophils. -He does not report any fevers, night sweats.  She reports some weight loss but  cannot quantify it. - No new medications other than sertraline which was started on 12/07/2018. -No recent infections or hospitalizations noted. - She has been on antiretrovirals for a long time.  Denies any prior history of blood transfusions.  No prior history of IV drug abuse. -Medical records show that she had a bone marrow biopsy on 03/20/2008 which showed variably cellular marrow with trilineage hematopoiesis.  Plasma cells are increased representing 26% of the cells.  Staining show polyclonal pattern. - She reported tiredness in the past few weeks. - We will repeat her CBC with differential and review her smear.  Will check LDH and reticulocyte count.  We will rule out nutritional deficiencies by checking T65, folic acid, methylmalonic acid, copper.  We will also check ferritin, and iron panel.  We will also check for infectious etiologies including CMV, EBV, parvo B19 and hepatitis panel.  We will follow-up on the CD4 count and viral load sent at nursing home. -We will see her back in 3 weeks for follow-up.  If stable tests are nonconclusive, will consider bone marrow aspiration biopsy.  2.  HIV: - She has been on Epivir 50 mg daily, Retrovir 100 mg 3 times daily and Tivicay 50 mg daily. -We will follow-up on CD4 count and viral load sent at nursing home.  3.  ESRD: -She undergoes hemodialysis on Mondays, Wednesdays and Fridays.  4.  Chronic pain: -She is on fentanyl 50 mcg and oxycodone 10 mg twice daily as needed.       All questions were answered. The patient knows to call the clinic with any problems, questions or concerns.     Derek Jack, MD 12/08/18 3:34 PM

## 2018-12-08 NOTE — Patient Instructions (Signed)
[place] at [facility] Discharge Instructions  You were seen today by Dr. Delton Coombes. He went over your history, family history and how you've been feeling lately. He will get blood drawn today. He will see you back in 3-4 weeks for follow up.   Thank you for choosing [place] at [facility] to provide your oncology and hematology care.  To afford each patient quality time with our provider, please arrive at least 15 minutes before your scheduled appointment time.   If you have a lab appointment with the [facility] please come in thru the  Main Entrance and check in at the main information desk  You need to re-schedule your appointment should you arrive 10 or more minutes late.  We strive to give you quality time with our providers, and arriving late affects you and other patients whose appointments are after yours.  Also, if you no show three or more times for appointments you may be dismissed from the clinic at the providers discretion.     Again, thank you for choosing [facility].  Our hope is that these requests will decrease the amount of time that you wait before being seen by our physicians.       _____________________________________________________________  Should you have questions after your visit to [facility], please contact our office at [phone] between the hours of 8:00 a.m. and 4:30 p.m.  Voicemails left after 4:00 p.m. will not be returned until the following business day.  For prescription refill requests, have your pharmacy contact our office and allow 72 hours.    Cancer Center Support Programs:   > Cancer Support Group  2nd Tuesday of the month 1pm-2pm, Journey Room

## 2018-12-08 NOTE — Addendum Note (Signed)
Addended by: Tally Due on: 12/08/2018 03:44 PM   Modules accepted: Orders

## 2018-12-09 LAB — PARVOVIRUS B19 ANTIBODY, IGG AND IGM
Parovirus B19 IgG Abs: 0.1 index (ref 0.0–0.8)
Parovirus B19 IgM Abs: 0.1 index (ref 0.0–0.8)

## 2018-12-09 LAB — PROTEIN ELECTROPHORESIS, SERUM
A/G Ratio: 1 (ref 0.7–1.7)
Albumin ELP: 4.2 g/dL (ref 2.9–4.4)
Alpha-1-Globulin: 0.3 g/dL (ref 0.0–0.4)
Alpha-2-Globulin: 0.8 g/dL (ref 0.4–1.0)
Beta Globulin: 0.7 g/dL (ref 0.7–1.3)
Gamma Globulin: 2.6 g/dL — ABNORMAL HIGH (ref 0.4–1.8)
Globulin, Total: 4.4 g/dL — ABNORMAL HIGH (ref 2.2–3.9)
Total Protein ELP: 8.6 g/dL — ABNORMAL HIGH (ref 6.0–8.5)

## 2018-12-09 LAB — HCV RNA QUANT RFLX ULTRA OR GENOTYP
HCV RNA Qnt(log copy/mL): UNDETERMINED log10 IU/mL
HepC Qn: NOT DETECTED IU/mL

## 2018-12-09 LAB — EBV AB TO VIRAL CAPSID AG PNL, IGG+IGM
EBV VCA IgG: 586 U/mL — ABNORMAL HIGH (ref 0.0–17.9)
EBV VCA IgM: <36 U/mL (ref 0.0–35.9)

## 2018-12-10 LAB — CMV DNA, QUANTITATIVE, PCR
CMV DNA Quant: NEGATIVE IU/mL
Log10 CMV Qn DNA Pl: UNDETERMINED log10 IU/mL

## 2018-12-12 DIAGNOSIS — E611 Iron deficiency: Secondary | ICD-10-CM | POA: Diagnosis not present

## 2018-12-12 DIAGNOSIS — Z992 Dependence on renal dialysis: Secondary | ICD-10-CM | POA: Diagnosis not present

## 2018-12-12 DIAGNOSIS — Z1383 Encounter for screening for respiratory disorder NEC: Secondary | ICD-10-CM | POA: Diagnosis not present

## 2018-12-12 DIAGNOSIS — D472 Monoclonal gammopathy: Secondary | ICD-10-CM | POA: Diagnosis not present

## 2018-12-12 DIAGNOSIS — Z20828 Contact with and (suspected) exposure to other viral communicable diseases: Secondary | ICD-10-CM | POA: Diagnosis not present

## 2018-12-12 DIAGNOSIS — N186 End stage renal disease: Secondary | ICD-10-CM | POA: Diagnosis not present

## 2018-12-12 DIAGNOSIS — N2581 Secondary hyperparathyroidism of renal origin: Secondary | ICD-10-CM | POA: Diagnosis not present

## 2018-12-12 DIAGNOSIS — Z23 Encounter for immunization: Secondary | ICD-10-CM | POA: Diagnosis not present

## 2018-12-12 DIAGNOSIS — D509 Iron deficiency anemia, unspecified: Secondary | ICD-10-CM | POA: Diagnosis not present

## 2018-12-14 DIAGNOSIS — N2581 Secondary hyperparathyroidism of renal origin: Secondary | ICD-10-CM | POA: Diagnosis not present

## 2018-12-14 DIAGNOSIS — D509 Iron deficiency anemia, unspecified: Secondary | ICD-10-CM | POA: Diagnosis not present

## 2018-12-14 DIAGNOSIS — E611 Iron deficiency: Secondary | ICD-10-CM | POA: Diagnosis not present

## 2018-12-14 DIAGNOSIS — N186 End stage renal disease: Secondary | ICD-10-CM | POA: Diagnosis not present

## 2018-12-14 DIAGNOSIS — Z23 Encounter for immunization: Secondary | ICD-10-CM | POA: Diagnosis not present

## 2018-12-14 DIAGNOSIS — D472 Monoclonal gammopathy: Secondary | ICD-10-CM | POA: Diagnosis not present

## 2018-12-14 LAB — METHYLMALONIC ACID, SERUM: Methylmalonic Acid, Quantitative: 713 nmol/L — ABNORMAL HIGH (ref 0–378)

## 2018-12-16 DIAGNOSIS — D509 Iron deficiency anemia, unspecified: Secondary | ICD-10-CM | POA: Diagnosis not present

## 2018-12-16 DIAGNOSIS — N186 End stage renal disease: Secondary | ICD-10-CM | POA: Diagnosis not present

## 2018-12-16 DIAGNOSIS — E611 Iron deficiency: Secondary | ICD-10-CM | POA: Diagnosis not present

## 2018-12-16 DIAGNOSIS — Z23 Encounter for immunization: Secondary | ICD-10-CM | POA: Diagnosis not present

## 2018-12-16 DIAGNOSIS — N2581 Secondary hyperparathyroidism of renal origin: Secondary | ICD-10-CM | POA: Diagnosis not present

## 2018-12-16 DIAGNOSIS — D472 Monoclonal gammopathy: Secondary | ICD-10-CM | POA: Diagnosis not present

## 2018-12-18 DIAGNOSIS — Z23 Encounter for immunization: Secondary | ICD-10-CM | POA: Diagnosis not present

## 2018-12-18 DIAGNOSIS — D509 Iron deficiency anemia, unspecified: Secondary | ICD-10-CM | POA: Diagnosis not present

## 2018-12-18 DIAGNOSIS — N186 End stage renal disease: Secondary | ICD-10-CM | POA: Diagnosis not present

## 2018-12-18 DIAGNOSIS — D472 Monoclonal gammopathy: Secondary | ICD-10-CM | POA: Diagnosis not present

## 2018-12-18 DIAGNOSIS — E611 Iron deficiency: Secondary | ICD-10-CM | POA: Diagnosis not present

## 2018-12-18 DIAGNOSIS — N2581 Secondary hyperparathyroidism of renal origin: Secondary | ICD-10-CM | POA: Diagnosis not present

## 2018-12-19 DIAGNOSIS — D509 Iron deficiency anemia, unspecified: Secondary | ICD-10-CM | POA: Diagnosis not present

## 2018-12-19 DIAGNOSIS — Z23 Encounter for immunization: Secondary | ICD-10-CM | POA: Diagnosis not present

## 2018-12-19 DIAGNOSIS — N2581 Secondary hyperparathyroidism of renal origin: Secondary | ICD-10-CM | POA: Diagnosis not present

## 2018-12-19 DIAGNOSIS — Z1383 Encounter for screening for respiratory disorder NEC: Secondary | ICD-10-CM | POA: Diagnosis not present

## 2018-12-19 DIAGNOSIS — Z20828 Contact with and (suspected) exposure to other viral communicable diseases: Secondary | ICD-10-CM | POA: Diagnosis not present

## 2018-12-19 DIAGNOSIS — D472 Monoclonal gammopathy: Secondary | ICD-10-CM | POA: Diagnosis not present

## 2018-12-19 DIAGNOSIS — N186 End stage renal disease: Secondary | ICD-10-CM | POA: Diagnosis not present

## 2018-12-19 DIAGNOSIS — E611 Iron deficiency: Secondary | ICD-10-CM | POA: Diagnosis not present

## 2018-12-21 DIAGNOSIS — N186 End stage renal disease: Secondary | ICD-10-CM | POA: Diagnosis not present

## 2018-12-21 DIAGNOSIS — E611 Iron deficiency: Secondary | ICD-10-CM | POA: Diagnosis not present

## 2018-12-21 DIAGNOSIS — D509 Iron deficiency anemia, unspecified: Secondary | ICD-10-CM | POA: Diagnosis not present

## 2018-12-21 DIAGNOSIS — N2581 Secondary hyperparathyroidism of renal origin: Secondary | ICD-10-CM | POA: Diagnosis not present

## 2018-12-21 DIAGNOSIS — D472 Monoclonal gammopathy: Secondary | ICD-10-CM | POA: Diagnosis not present

## 2018-12-21 DIAGNOSIS — Z23 Encounter for immunization: Secondary | ICD-10-CM | POA: Diagnosis not present

## 2018-12-26 DIAGNOSIS — N186 End stage renal disease: Secondary | ICD-10-CM | POA: Diagnosis not present

## 2018-12-26 DIAGNOSIS — Z23 Encounter for immunization: Secondary | ICD-10-CM | POA: Diagnosis not present

## 2018-12-26 DIAGNOSIS — D509 Iron deficiency anemia, unspecified: Secondary | ICD-10-CM | POA: Diagnosis not present

## 2018-12-26 DIAGNOSIS — D472 Monoclonal gammopathy: Secondary | ICD-10-CM | POA: Diagnosis not present

## 2018-12-26 DIAGNOSIS — E611 Iron deficiency: Secondary | ICD-10-CM | POA: Diagnosis not present

## 2018-12-26 DIAGNOSIS — N2581 Secondary hyperparathyroidism of renal origin: Secondary | ICD-10-CM | POA: Diagnosis not present

## 2018-12-28 DIAGNOSIS — Z23 Encounter for immunization: Secondary | ICD-10-CM | POA: Diagnosis not present

## 2018-12-28 DIAGNOSIS — R4189 Other symptoms and signs involving cognitive functions and awareness: Secondary | ICD-10-CM | POA: Diagnosis not present

## 2018-12-28 DIAGNOSIS — G8929 Other chronic pain: Secondary | ICD-10-CM | POA: Diagnosis not present

## 2018-12-28 DIAGNOSIS — D472 Monoclonal gammopathy: Secondary | ICD-10-CM | POA: Diagnosis not present

## 2018-12-28 DIAGNOSIS — D509 Iron deficiency anemia, unspecified: Secondary | ICD-10-CM | POA: Diagnosis not present

## 2018-12-28 DIAGNOSIS — B20 Human immunodeficiency virus [HIV] disease: Secondary | ICD-10-CM | POA: Diagnosis not present

## 2018-12-28 DIAGNOSIS — I480 Paroxysmal atrial fibrillation: Secondary | ICD-10-CM | POA: Diagnosis not present

## 2018-12-28 DIAGNOSIS — N2581 Secondary hyperparathyroidism of renal origin: Secondary | ICD-10-CM | POA: Diagnosis not present

## 2018-12-28 DIAGNOSIS — Z992 Dependence on renal dialysis: Secondary | ICD-10-CM | POA: Diagnosis not present

## 2018-12-28 DIAGNOSIS — N186 End stage renal disease: Secondary | ICD-10-CM | POA: Diagnosis not present

## 2018-12-28 DIAGNOSIS — E611 Iron deficiency: Secondary | ICD-10-CM | POA: Diagnosis not present

## 2018-12-31 DIAGNOSIS — Z992 Dependence on renal dialysis: Secondary | ICD-10-CM | POA: Diagnosis not present

## 2018-12-31 DIAGNOSIS — N186 End stage renal disease: Secondary | ICD-10-CM | POA: Diagnosis not present

## 2019-01-01 DIAGNOSIS — N186 End stage renal disease: Secondary | ICD-10-CM | POA: Diagnosis not present

## 2019-01-01 DIAGNOSIS — D631 Anemia in chronic kidney disease: Secondary | ICD-10-CM | POA: Diagnosis not present

## 2019-01-01 DIAGNOSIS — Z992 Dependence on renal dialysis: Secondary | ICD-10-CM | POA: Diagnosis not present

## 2019-01-01 DIAGNOSIS — D472 Monoclonal gammopathy: Secondary | ICD-10-CM | POA: Diagnosis not present

## 2019-01-01 DIAGNOSIS — N2581 Secondary hyperparathyroidism of renal origin: Secondary | ICD-10-CM | POA: Diagnosis not present

## 2019-01-01 DIAGNOSIS — D509 Iron deficiency anemia, unspecified: Secondary | ICD-10-CM | POA: Diagnosis not present

## 2019-01-02 DIAGNOSIS — Z20828 Contact with and (suspected) exposure to other viral communicable diseases: Secondary | ICD-10-CM | POA: Diagnosis not present

## 2019-01-02 DIAGNOSIS — Z1383 Encounter for screening for respiratory disorder NEC: Secondary | ICD-10-CM | POA: Diagnosis not present

## 2019-01-04 DIAGNOSIS — N186 End stage renal disease: Secondary | ICD-10-CM | POA: Diagnosis not present

## 2019-01-04 DIAGNOSIS — D509 Iron deficiency anemia, unspecified: Secondary | ICD-10-CM | POA: Diagnosis not present

## 2019-01-04 DIAGNOSIS — D631 Anemia in chronic kidney disease: Secondary | ICD-10-CM | POA: Diagnosis not present

## 2019-01-04 DIAGNOSIS — D472 Monoclonal gammopathy: Secondary | ICD-10-CM | POA: Diagnosis not present

## 2019-01-04 DIAGNOSIS — N2581 Secondary hyperparathyroidism of renal origin: Secondary | ICD-10-CM | POA: Diagnosis not present

## 2019-01-04 DIAGNOSIS — Z992 Dependence on renal dialysis: Secondary | ICD-10-CM | POA: Diagnosis not present

## 2019-01-06 DIAGNOSIS — N186 End stage renal disease: Secondary | ICD-10-CM | POA: Diagnosis not present

## 2019-01-06 DIAGNOSIS — Z992 Dependence on renal dialysis: Secondary | ICD-10-CM | POA: Diagnosis not present

## 2019-01-06 DIAGNOSIS — N2581 Secondary hyperparathyroidism of renal origin: Secondary | ICD-10-CM | POA: Diagnosis not present

## 2019-01-06 DIAGNOSIS — D509 Iron deficiency anemia, unspecified: Secondary | ICD-10-CM | POA: Diagnosis not present

## 2019-01-06 DIAGNOSIS — D472 Monoclonal gammopathy: Secondary | ICD-10-CM | POA: Diagnosis not present

## 2019-01-06 DIAGNOSIS — D631 Anemia in chronic kidney disease: Secondary | ICD-10-CM | POA: Diagnosis not present

## 2019-01-09 DIAGNOSIS — D472 Monoclonal gammopathy: Secondary | ICD-10-CM | POA: Diagnosis not present

## 2019-01-09 DIAGNOSIS — D631 Anemia in chronic kidney disease: Secondary | ICD-10-CM | POA: Diagnosis not present

## 2019-01-09 DIAGNOSIS — D509 Iron deficiency anemia, unspecified: Secondary | ICD-10-CM | POA: Diagnosis not present

## 2019-01-09 DIAGNOSIS — N2581 Secondary hyperparathyroidism of renal origin: Secondary | ICD-10-CM | POA: Diagnosis not present

## 2019-01-09 DIAGNOSIS — Z20828 Contact with and (suspected) exposure to other viral communicable diseases: Secondary | ICD-10-CM | POA: Diagnosis not present

## 2019-01-09 DIAGNOSIS — Z992 Dependence on renal dialysis: Secondary | ICD-10-CM | POA: Diagnosis not present

## 2019-01-09 DIAGNOSIS — N186 End stage renal disease: Secondary | ICD-10-CM | POA: Diagnosis not present

## 2019-01-10 ENCOUNTER — Other Ambulatory Visit: Payer: Self-pay

## 2019-01-10 ENCOUNTER — Encounter (HOSPITAL_COMMUNITY): Payer: Self-pay | Admitting: Hematology

## 2019-01-10 ENCOUNTER — Inpatient Hospital Stay (HOSPITAL_COMMUNITY): Payer: Medicare Other | Attending: Hematology | Admitting: Hematology

## 2019-01-10 VITALS — BP 142/94 | HR 62 | Temp 97.8°F | Resp 18

## 2019-01-10 DIAGNOSIS — D61818 Other pancytopenia: Secondary | ICD-10-CM | POA: Diagnosis not present

## 2019-01-10 DIAGNOSIS — E538 Deficiency of other specified B group vitamins: Secondary | ICD-10-CM | POA: Diagnosis not present

## 2019-01-10 MED ORDER — CYANOCOBALAMIN 1000 MCG/ML IJ SOLN
1000.0000 ug | Freq: Once | INTRAMUSCULAR | Status: AC
  Administered 2019-01-10: 11:00:00 1000 ug via INTRAMUSCULAR

## 2019-01-10 NOTE — Assessment & Plan Note (Signed)
1.  Pancytopenia: -CBC on 09/20/2018 showed white count 2.6, hemoglobin 9.4 and MCV 100, platelet count 128.  Differential showed 44% neutrophils. -Repeat CBC in our office on 12/08/2018 shows white count 3.4 with 60% neutrophils.  Hemoglobin improved to 10.8 and platelet count was normal at 172. -B12 was normal with elevated methylmalonic acid of 700.  I have given a B12 shot today in the office.  She was told to take B12 1 mg tablet daily. -We have done infectious disease panel including CMV, EBV and parvo B19 and hepatitis panel which were negative.  EBV IgG antibody was positive but IgM was negative. -I have recommended follow-up in 2 months with repeat labs and B12 levels.  2.  HIV: -She is on Epivir 50 mg daily, Retrovir 100 mg 3 times a day and Tivicay 50 mg daily. -CD4 count and viral load are done periodically by infectious disease.  3.  ESRD: -She undergoes hemodialysis on Mondays, Wednesdays and Fridays.  4.  Chronic pain: -She is on fentanyl 50 mcg and oxycodone 10 mg twice daily as needed.

## 2019-01-10 NOTE — Patient Instructions (Addendum)
[place] at [facility] Discharge Instructions  You were seen today by Dr. Delton Coombes. He went over your recent lab results. He will see you back in 2 months for labs and follow up.Vit B12 injection given as well   Thank you for choosing [place] at [facility] to provide your oncology and hematology care.  To afford each patient quality time with our provider, please arrive at least 15 minutes before your scheduled appointment time.   If you have a lab appointment with the [facility] please come in thru the  Main Entrance and check in at the main information desk  You need to re-schedule your appointment should you arrive 10 or more minutes late.  We strive to give you quality time with our providers, and arriving late affects you and other patients whose appointments are after yours.  Also, if you no show three or more times for appointments you may be dismissed from the clinic at the providers discretion.     Again, thank you for choosing [facility].  Our hope is that these requests will decrease the amount of time that you wait before being seen by our physicians.       _____________________________________________________________  Should you have questions after your visit to [facility], please contact our office at [phone] between the hours of 8:00 a.m. and 4:30 p.m.  Voicemails left after 4:00 p.m. will not be returned until the following business day.  For prescription refill requests, have your pharmacy contact our office and allow 72 hours.    Cancer Center Support Programs:   > Cancer Support Group  2nd Tuesday of the month 1pm-2pm, Journey Room

## 2019-01-10 NOTE — Progress Notes (Signed)
1119 Labs reviewed with and pt seen by Dr. Delton Coombes and pt to receive a Vit B12 injection today per MD                   [name] tolerated Vit B12 injection well without complaints or incident. Pt discharged via wheelchair in satisfactory condition accompanied by her caregiver

## 2019-01-10 NOTE — Progress Notes (Signed)
[name], [place] [address]   CLINIC:  Medical Oncology/Hematology  PCP:  Hilbert Corrigan, [place] [address] [phone]   REASON FOR VISIT:  Follow-up for pancytopenia.  CURRENT THERAPY: Vitamin B12.   INTERVAL HISTORY:  [name] 68 y.o. female seen for follow-up of pancytopenia.  She reports some constipation.  Her appetite and energy levels rated as 25%.  Denies any fevers, chills.  Denies any night sweats, weight loss.  No recent hospitalizations reported.  Occasional nausea is reported and stable.    REVIEW OF SYSTEMS:  Review of Systems  Gastrointestinal: Positive for constipation and nausea.  All other systems reviewed and are negative.    PAST MEDICAL/SURGICAL HISTORY:  Past Medical History:  Diagnosis Date  . Anemia   . Anxiety   . Atrial flutter ([place]) 02/22/2015  . C. difficile colitis 10/10/11  . Chronic diarrhea   . Depression   . ESRD on hemodialysis ([place])   . GERD (gastroesophageal reflux disease)   . HIV (human immunodeficiency virus infection) ([place])   . Hypertension   . Insomnia   . Intracranial hemorrhage ([place])   . Muscle weakness (generalized)   . Pulmonary HTN ([place])   . Tachycardia   . TIA (transient ischemic attack)   . Traumatic hematoma of right upper arm 02/22/2015   Past Surgical History:  Procedure Laterality Date  . A/V FISTULAGRAM N/A 04/17/2016   Procedure: A/V Fistulagram - Right Upper;  Surgeon: Waynetta Sandy, MD;  Location: [facility];  Service: Cardiovascular;  Laterality: N/A;  . BIOPSY THYROID    . Dialysis Shunts     previous one removed from left arm and now present one in right arm  . DIALYSIS/PERMA CATHETER INSERTION Right 04/17/2016   Procedure: dialysis Catheter Insertion central veinous;  Surgeon: Waynetta Sandy, MD;  Location: [facility];  Service: Cardiovascular;  Laterality: Right;  .  ESOPHAGOGASTRODUODENOSCOPY  12/15/2010   Procedure: ESOPHAGOGASTRODUODENOSCOPY (EGD);  Surgeon: Daneil Dolin, MD;  [facility];  Service: Endoscopy;  Laterality: N/A;  . EXCISION OF BREAST BIOPSY Right 05/04/2012   Procedure: EXCISION OF BREAST BIOPSY;  Surgeon: Donato Heinz, MD;  Location: [facility];  Service: General;  Laterality: Right;  Right Excisional Breast Biopsy  . FISTULOGRAM Right 03/26/2014   Procedure: Right Arm Fistulogram with Venoplasty Right Subclavian Vein and Inominate Vein. Debridement Fistula Ulcer;  Surgeon: Conrad [place], MD;  Location: [facility];  Service: Vascular;  Laterality: Right;  . FISTULOGRAM Right 03/29/2017   Procedure: FISTULOGRAM COMPLEX RIGHT ARM with Balloon angioplasty;  Surgeon: Waynetta Sandy, MD;  Location: [facility];  Service: Vascular;  Laterality: Right;  . IR GENERIC HISTORICAL  05/19/2016   IR REMOVAL TUN CV CATH W/O FL 05/19/2016 Markus Daft, MD [facility]  . PERIPHERAL VASCULAR BALLOON ANGIOPLASTY Right 04/17/2016   Procedure: Peripheral Vascular Balloon Angioplasty;  Surgeon: Waynetta Sandy, MD;  Location: [facility];  Service: Cardiovascular;  Laterality: Right;  Central segment AV Fistula  . PERIPHERAL VASCULAR BALLOON ANGIOPLASTY Right 03/14/2018   Procedure: PERIPHERAL VASCULAR BALLOON ANGIOPLASTY;  Surgeon: Waynetta Sandy, MD;  Location: [facility];  Service: Cardiovascular;  Laterality: Right;  subclavian vein  . PERIPHERAL VASCULAR INTERVENTION Right 03/14/2018   Procedure: PERIPHERAL VASCULAR INTERVENTION;  Surgeon: Waynetta Sandy, MD;  Location: [facility];  Service: Cardiovascular;  Laterality: Right;  subclavian vein  .  SHUNTOGRAM Right 10/19/2013   Procedure: FISTULOGRAM;  Surgeon: Conrad Crane, MD;  Location: [facility];  Service: Cardiovascular;  Laterality: Right;  . TONSILLECTOMY    . VISCERAL VENOGRAPHY Right 03/14/2018   Procedure: CENTRAL VENOGRAPHY;  Surgeon: Waynetta Sandy, MD;  Location: [facility];  Service: Cardiovascular;  Laterality: Right;  upper ext fistula     SOCIAL HISTORY:  Social History   Socioeconomic History  . Marital status: Widowed    Spouse name: Not on file  . Number of children: 1  . Years of education: Not on file  . Highest education level: Not on file  Occupational History  . Not on file  Social Needs  . Financial resource strain: Not on file  . Food insecurity    Worry: Not on file    Inability: Not on file  . Transportation needs    Medical: Not on file    Non-medical: Not on file  Tobacco Use  . Smoking status: Former Smoker    Quit date: 03/12/1983    Years since quitting: 35.8  . Smokeless tobacco: Never Used  Substance and Sexual Activity  . Alcohol use: No    Alcohol/week: 0.0 standard drinks  . Drug use: No  . Sexual activity: Not Currently  Lifestyle  . Physical activity    Days per week: Not on file    Minutes per session: Not on file  . Stress: Not on file  Relationships  . Social Herbalist on phone: Not on file    Gets together: Not on file    Attends religious service: Not on file    Active member of club or organization: Not on file    Attends meetings of clubs or organizations: Not on file    Relationship status: Not on file  . Intimate partner violence    Fear of current or ex partner: Not on file    Emotionally abused: Not on file    Physically abused: Not on file    Forced sexual activity: Not on file  Other Topics Concern  . Not on file  Social History Narrative  . Not on file    FAMILY HISTORY:  Family History  Problem Relation Age of Onset  . Anesthesia problems Neg Hx   . Hypotension Neg Hx   . Malignant hyperthermia Neg Hx   . Pseudochol deficiency Neg Hx     CURRENT MEDICATIONS:  Outpatient Encounter Medications as of 01/10/2019  Medication Sig Note  . acetaminophen (TYLENOL) 325 MG tablet Take 650 mg by mouth every 4 (four) hours  as needed for mild pain or fever. 03/08/2018: Standing order, not listed under medications   . alum & mag hydroxide-simeth (MAALOX/MYLANTA) 200-200-20 MG/5ML suspension Take 30 mLs by mouth every 2 (two) hours as needed for indigestion or heartburn. Max 4 doses per 24 hours   . Biotin 5000 MCG CAPS Take by mouth.   . bisacodyl (FLEET) 10 MG/30ML ENEM Place 10 mg rectally daily as needed (constipation). 03/08/2018: Standing order, not listed under medications   . cinacalcet (SENSIPAR) 30 MG tablet Take 60 mg by mouth at bedtime.    . clopidogrel (PLAVIX) 75 MG tablet Take 1 tablet (75 mg total) by mouth daily.   Marland Kitchen diltiazem (CARDIZEM) 60 MG tablet Take 60 mg by mouth 2 (two) times daily.   . dolutegravir (TIVICAY) 50 MG tablet Take 50 mg by mouth daily.   . fentaNYL (DURAGESIC - DOSED  MCG/HR) 50 MCG/HR Apply one patch every 72 hours for pain. Remove old patch. External use only. Rotate sites. (Patient taking differently: Place 50 mcg onto the skin every 3 (three) days. Remove old patch. External use only. Rotate sites.)   . glucagon (GLUCAGON EMERGENCY) 1 MG injection Inject 1 mg into the vein once as needed (if bs is < 60 and pt is unconscious). 03/08/2018: Standing order, not listed under medications   . ipratropium-albuterol (DUONEB) 0.5-2.5 (3) MG/3ML SOLN    . lamivudine (EPIVIR) 100 MG tablet Take 50 mg by mouth daily.   Marland Kitchen lidocaine-prilocaine (EMLA) cream Apply 1 application topically 3 (three) times a week. Apply 1 hour prior to dialysis access site on Monday Wednesday and Friday   . metoprolol (LOPRESSOR) 50 MG tablet Take 50 mg by mouth 2 (two) times daily.   . multivitamin (RENA-VIT) TABS tablet Take 1 tablet by mouth at bedtime.   . Nutritional Supplements (NOVASOURCE RENAL) LIQD Take 237 mLs by mouth 3 (three) times daily.   . ondansetron (ZOFRAN-ODT) 4 MG disintegrating tablet Take 4 mg by mouth every 4 (four) hours as needed for nausea or vomiting. If not resolved after 2 doses then take  promethazine 25 mg 03/08/2018: Standing order, not listed under medications   . Oxycodone HCl 10 MG TABS Take 10 mg by mouth every 12 (twelve) hours as needed (severe pain).   . promethazine (PHENERGAN) 12.5 MG tablet Take 12.5 mg by mouth every Monday, Wednesday, and Friday with hemodialysis. May take an additional 12.5 mg dose every 6 hours as needed for nausea   . sertraline (ZOLOFT) 25 MG tablet Take 25 mg by mouth daily. Take 25 mg with the 100 mg.   . SERTRALINE HCL PO Take 100 mg by mouth daily.  03/07/2018: MAR doesn't specify the strength of the tablet  . sevelamer carbonate (RENVELA) 800 MG tablet Take 2,400 mg by mouth 3 (three) times daily with meals.    . sildenafil (REVATIO) 20 MG tablet Take 20 mg by mouth 3 (three) times daily.    . vitamin C (ASCORBIC ACID) 500 MG tablet Take 500 mg by mouth 2 (two) times daily.   . zaleplon (SONATA) 5 MG capsule Take one capsule by mouth every night at bedtime for rest (Patient taking differently: Take 5 mg by mouth at bedtime. )   . zidovudine (RETROVIR) 100 MG capsule Take 100 mg by mouth 3 (three) times daily.      Facility-Administered Encounter Medications as of 01/10/2019  Medication  . clopidogrel (PLAVIX) tablet 300 mg  . [COMPLETED] cyanocobalamin ((VITAMIN B-12)) injection 1,000 mcg    ALLERGIES:  Allergies  Allergen Reactions  . Lactose Intolerance (Gi) Other (See Comments)    On MAR  . Latex Rash and Itching  . Penicillins Rash and Swelling    Has patient had a PCN reaction causing immediate rash, facial/tongue/throat swelling, SOB or lightheadedness with hypotension: No Has patient had a PCN reaction causing severe rash involving mucus membranes or skin necrosis: No Has patient had a PCN reaction that required hospitalization No Has patient had a PCN reaction occurring within the last 10 years: No If all of the above answers are "NO", then may proceed with Cephalosporin use.       PHYSICAL EXAM:  ECOG Performance status:  1  Vitals:   01/10/19 1000  BP: (!) 142/94  Pulse: 62  Resp: 18  Temp: 97.8 F (36.6 C)  SpO2: 97%   Filed Weights  Physical Exam Vitals signs reviewed.  Constitutional:      Appearance: Normal appearance.  Cardiovascular:     Rate and Rhythm: Normal rate and regular rhythm.     Heart sounds: Normal heart sounds.  Pulmonary:     Breath sounds: Normal breath sounds.  Abdominal:     General: There is no distension.     Palpations: Abdomen is soft. There is no mass.  Skin:    General: Skin is warm.  Neurological:     General: No focal deficit present.     Mental Status: She is alert and oriented to person, place, and time.  Psychiatric:        Mood and Affect: Mood normal.        Behavior: Behavior normal.   Fistulas bilaterally present.   LABORATORY DATA:  I have reviewed the labs as listed.  CBC    Component Value Date/Time   WBC 3.4 (L) 12/08/2018 1436   RBC 3.13 (L) 12/08/2018 1436   RBC 3.13 (L) 12/08/2018 1436   HGB 10.8 (L) 12/08/2018 1436   HCT 35.5 (L) 12/08/2018 1436   PLT 172 12/08/2018 1436   MCV 113.4 (H) 12/08/2018 1436   MCH 34.5 (H) 12/08/2018 1436   MCHC 30.4 12/08/2018 1436   RDW 14.6 12/08/2018 1436   LYMPHSABS 1.0 12/08/2018 1436   MONOABS 0.3 12/08/2018 1436   EOSABS 0.1 12/08/2018 1436   BASOSABS 0.0 12/08/2018 1436   CMP Latest Ref Rng & Units 03/14/2018 10/19/2017 07/23/2017  Glucose 70 - 99 mg/dL 73 72 75  BUN 8 - 23 mg/dL 52(H) 38(H) 30(H)  Creatinine 0.44 - 1.00 mg/dL 14.70(H) 7.19(H) 5.64(H)  Sodium 135 - 145 mmol/L 136 137 136  Potassium 3.5 - 5.1 mmol/L 4.4 3.5 3.9  Chloride 98 - 111 mmol/L 104 96(L) 95(L)  CO2 20 - 32 mmol/L - 26 30  Calcium 8.6 - 10.4 mg/dL - 8.9 8.3(L)  Total Protein 6.1 - 8.1 g/dL - 8.1 8.9(H)  Total Bilirubin 0.2 - 1.2 mg/dL - 0.5 0.8  Alkaline Phos 38 - 126 U/L - - 82  AST 10 - 35 U/L - 17 19  ALT 6 - 29 U/L - 11 12(L)       DIAGNOSTIC IMAGING:  I have independently reviewed the scans and  discussed with the patient.    ASSESSMENT & PLAN:   Other pancytopenia ([place]) 1.  Pancytopenia: -CBC on 09/20/2018 showed white count 2.6, hemoglobin 9.4 and MCV 100, platelet count 128.  Differential showed 44% neutrophils. -Repeat CBC in our office on 12/08/2018 shows white count 3.4 with 60% neutrophils.  Hemoglobin improved to 10.8 and platelet count was normal at 172. -B12 was normal with elevated methylmalonic acid of 700.  I have given a B12 shot today in the office.  She was told to take B12 1 mg tablet daily. -We have done infectious disease panel including CMV, EBV and parvo B19 and hepatitis panel which were negative.  EBV IgG antibody was positive but IgM was negative. -I have recommended follow-up in 2 months with repeat labs and B12 levels.  2.  HIV: -She is on Epivir 50 mg daily, Retrovir 100 mg 3 times a day and Tivicay 50 mg daily. -CD4 count and viral load are done periodically by infectious disease.  3.  ESRD: -She undergoes hemodialysis on Mondays, Wednesdays and Fridays.  4.  Chronic pain: -She is on fentanyl 50 mcg and oxycodone 10 mg twice daily as needed.  Orders placed this encounter:  Orders Placed This Encounter  Procedures  . CBC with Differential  . Methylmalonic acid, serum  . Vitamin B12      Derek Jack, [place] [phone]

## 2019-01-11 DIAGNOSIS — D631 Anemia in chronic kidney disease: Secondary | ICD-10-CM | POA: Diagnosis not present

## 2019-01-11 DIAGNOSIS — N2581 Secondary hyperparathyroidism of renal origin: Secondary | ICD-10-CM | POA: Diagnosis not present

## 2019-01-11 DIAGNOSIS — N186 End stage renal disease: Secondary | ICD-10-CM | POA: Diagnosis not present

## 2019-01-11 DIAGNOSIS — Z992 Dependence on renal dialysis: Secondary | ICD-10-CM | POA: Diagnosis not present

## 2019-01-11 DIAGNOSIS — D509 Iron deficiency anemia, unspecified: Secondary | ICD-10-CM | POA: Diagnosis not present

## 2019-01-11 DIAGNOSIS — D472 Monoclonal gammopathy: Secondary | ICD-10-CM | POA: Diagnosis not present

## 2019-01-11 DIAGNOSIS — F331 Major depressive disorder, recurrent, moderate: Secondary | ICD-10-CM | POA: Diagnosis not present

## 2019-01-11 DIAGNOSIS — F5101 Primary insomnia: Secondary | ICD-10-CM | POA: Diagnosis not present

## 2019-01-13 DIAGNOSIS — D631 Anemia in chronic kidney disease: Secondary | ICD-10-CM | POA: Diagnosis not present

## 2019-01-13 DIAGNOSIS — D472 Monoclonal gammopathy: Secondary | ICD-10-CM | POA: Diagnosis not present

## 2019-01-13 DIAGNOSIS — D509 Iron deficiency anemia, unspecified: Secondary | ICD-10-CM | POA: Diagnosis not present

## 2019-01-13 DIAGNOSIS — Z992 Dependence on renal dialysis: Secondary | ICD-10-CM | POA: Diagnosis not present

## 2019-01-13 DIAGNOSIS — N186 End stage renal disease: Secondary | ICD-10-CM | POA: Diagnosis not present

## 2019-01-13 DIAGNOSIS — N2581 Secondary hyperparathyroidism of renal origin: Secondary | ICD-10-CM | POA: Diagnosis not present

## 2019-01-16 DIAGNOSIS — D472 Monoclonal gammopathy: Secondary | ICD-10-CM | POA: Diagnosis not present

## 2019-01-16 DIAGNOSIS — N2581 Secondary hyperparathyroidism of renal origin: Secondary | ICD-10-CM | POA: Diagnosis not present

## 2019-01-16 DIAGNOSIS — D509 Iron deficiency anemia, unspecified: Secondary | ICD-10-CM | POA: Diagnosis not present

## 2019-01-16 DIAGNOSIS — Z20828 Contact with and (suspected) exposure to other viral communicable diseases: Secondary | ICD-10-CM | POA: Diagnosis not present

## 2019-01-16 DIAGNOSIS — N186 End stage renal disease: Secondary | ICD-10-CM | POA: Diagnosis not present

## 2019-01-16 DIAGNOSIS — D631 Anemia in chronic kidney disease: Secondary | ICD-10-CM | POA: Diagnosis not present

## 2019-01-16 DIAGNOSIS — Z992 Dependence on renal dialysis: Secondary | ICD-10-CM | POA: Diagnosis not present

## 2019-01-18 DIAGNOSIS — D631 Anemia in chronic kidney disease: Secondary | ICD-10-CM | POA: Diagnosis not present

## 2019-01-18 DIAGNOSIS — D472 Monoclonal gammopathy: Secondary | ICD-10-CM | POA: Diagnosis not present

## 2019-01-18 DIAGNOSIS — D509 Iron deficiency anemia, unspecified: Secondary | ICD-10-CM | POA: Diagnosis not present

## 2019-01-18 DIAGNOSIS — Z992 Dependence on renal dialysis: Secondary | ICD-10-CM | POA: Diagnosis not present

## 2019-01-18 DIAGNOSIS — N186 End stage renal disease: Secondary | ICD-10-CM | POA: Diagnosis not present

## 2019-01-18 DIAGNOSIS — N2581 Secondary hyperparathyroidism of renal origin: Secondary | ICD-10-CM | POA: Diagnosis not present

## 2019-01-20 DIAGNOSIS — N2581 Secondary hyperparathyroidism of renal origin: Secondary | ICD-10-CM | POA: Diagnosis not present

## 2019-01-20 DIAGNOSIS — D472 Monoclonal gammopathy: Secondary | ICD-10-CM | POA: Diagnosis not present

## 2019-01-20 DIAGNOSIS — D631 Anemia in chronic kidney disease: Secondary | ICD-10-CM | POA: Diagnosis not present

## 2019-01-20 DIAGNOSIS — D509 Iron deficiency anemia, unspecified: Secondary | ICD-10-CM | POA: Diagnosis not present

## 2019-01-20 DIAGNOSIS — N186 End stage renal disease: Secondary | ICD-10-CM | POA: Diagnosis not present

## 2019-01-20 DIAGNOSIS — Z992 Dependence on renal dialysis: Secondary | ICD-10-CM | POA: Diagnosis not present

## 2019-01-23 DIAGNOSIS — Z20828 Contact with and (suspected) exposure to other viral communicable diseases: Secondary | ICD-10-CM | POA: Diagnosis not present

## 2019-01-23 DIAGNOSIS — N186 End stage renal disease: Secondary | ICD-10-CM | POA: Diagnosis not present

## 2019-01-23 DIAGNOSIS — D509 Iron deficiency anemia, unspecified: Secondary | ICD-10-CM | POA: Diagnosis not present

## 2019-01-23 DIAGNOSIS — D631 Anemia in chronic kidney disease: Secondary | ICD-10-CM | POA: Diagnosis not present

## 2019-01-23 DIAGNOSIS — Z992 Dependence on renal dialysis: Secondary | ICD-10-CM | POA: Diagnosis not present

## 2019-01-23 DIAGNOSIS — Z03818 Encounter for observation for suspected exposure to other biological agents ruled out: Secondary | ICD-10-CM | POA: Diagnosis not present

## 2019-01-23 DIAGNOSIS — D472 Monoclonal gammopathy: Secondary | ICD-10-CM | POA: Diagnosis not present

## 2019-01-23 DIAGNOSIS — N2581 Secondary hyperparathyroidism of renal origin: Secondary | ICD-10-CM | POA: Diagnosis not present

## 2019-01-25 DIAGNOSIS — N2581 Secondary hyperparathyroidism of renal origin: Secondary | ICD-10-CM | POA: Diagnosis not present

## 2019-01-25 DIAGNOSIS — N186 End stage renal disease: Secondary | ICD-10-CM | POA: Diagnosis not present

## 2019-01-25 DIAGNOSIS — D509 Iron deficiency anemia, unspecified: Secondary | ICD-10-CM | POA: Diagnosis not present

## 2019-01-25 DIAGNOSIS — D472 Monoclonal gammopathy: Secondary | ICD-10-CM | POA: Diagnosis not present

## 2019-01-25 DIAGNOSIS — Z992 Dependence on renal dialysis: Secondary | ICD-10-CM | POA: Diagnosis not present

## 2019-01-25 DIAGNOSIS — D631 Anemia in chronic kidney disease: Secondary | ICD-10-CM | POA: Diagnosis not present

## 2019-01-28 DIAGNOSIS — D631 Anemia in chronic kidney disease: Secondary | ICD-10-CM | POA: Diagnosis not present

## 2019-01-28 DIAGNOSIS — D509 Iron deficiency anemia, unspecified: Secondary | ICD-10-CM | POA: Diagnosis not present

## 2019-01-28 DIAGNOSIS — Z992 Dependence on renal dialysis: Secondary | ICD-10-CM | POA: Diagnosis not present

## 2019-01-28 DIAGNOSIS — N186 End stage renal disease: Secondary | ICD-10-CM | POA: Diagnosis not present

## 2019-01-28 DIAGNOSIS — D472 Monoclonal gammopathy: Secondary | ICD-10-CM | POA: Diagnosis not present

## 2019-01-28 DIAGNOSIS — N2581 Secondary hyperparathyroidism of renal origin: Secondary | ICD-10-CM | POA: Diagnosis not present

## 2019-01-30 DIAGNOSIS — Z20828 Contact with and (suspected) exposure to other viral communicable diseases: Secondary | ICD-10-CM | POA: Diagnosis not present

## 2019-01-30 DIAGNOSIS — N186 End stage renal disease: Secondary | ICD-10-CM | POA: Diagnosis not present

## 2019-01-30 DIAGNOSIS — Z03818 Encounter for observation for suspected exposure to other biological agents ruled out: Secondary | ICD-10-CM | POA: Diagnosis not present

## 2019-01-30 DIAGNOSIS — D509 Iron deficiency anemia, unspecified: Secondary | ICD-10-CM | POA: Diagnosis not present

## 2019-01-30 DIAGNOSIS — D472 Monoclonal gammopathy: Secondary | ICD-10-CM | POA: Diagnosis not present

## 2019-01-30 DIAGNOSIS — D631 Anemia in chronic kidney disease: Secondary | ICD-10-CM | POA: Diagnosis not present

## 2019-01-30 DIAGNOSIS — N2581 Secondary hyperparathyroidism of renal origin: Secondary | ICD-10-CM | POA: Diagnosis not present

## 2019-01-30 DIAGNOSIS — Z992 Dependence on renal dialysis: Secondary | ICD-10-CM | POA: Diagnosis not present

## 2019-02-01 DIAGNOSIS — Z992 Dependence on renal dialysis: Secondary | ICD-10-CM | POA: Diagnosis not present

## 2019-02-01 DIAGNOSIS — F329 Major depressive disorder, single episode, unspecified: Secondary | ICD-10-CM | POA: Diagnosis not present

## 2019-02-01 DIAGNOSIS — G8929 Other chronic pain: Secondary | ICD-10-CM | POA: Diagnosis not present

## 2019-02-01 DIAGNOSIS — B20 Human immunodeficiency virus [HIV] disease: Secondary | ICD-10-CM | POA: Diagnosis not present

## 2019-02-06 DIAGNOSIS — Z03818 Encounter for observation for suspected exposure to other biological agents ruled out: Secondary | ICD-10-CM | POA: Diagnosis not present

## 2019-02-06 DIAGNOSIS — Z20828 Contact with and (suspected) exposure to other viral communicable diseases: Secondary | ICD-10-CM | POA: Diagnosis not present

## 2019-02-10 DIAGNOSIS — F331 Major depressive disorder, recurrent, moderate: Secondary | ICD-10-CM | POA: Diagnosis not present

## 2019-02-10 DIAGNOSIS — I259 Chronic ischemic heart disease, unspecified: Secondary | ICD-10-CM | POA: Diagnosis not present

## 2019-02-13 DIAGNOSIS — Z113 Encounter for screening for infections with a predominantly sexual mode of transmission: Secondary | ICD-10-CM | POA: Diagnosis not present

## 2019-02-13 DIAGNOSIS — Z79899 Other long term (current) drug therapy: Secondary | ICD-10-CM | POA: Diagnosis not present

## 2019-02-13 DIAGNOSIS — B20 Human immunodeficiency virus [HIV] disease: Secondary | ICD-10-CM | POA: Diagnosis not present

## 2019-02-23 DIAGNOSIS — Z79899 Other long term (current) drug therapy: Secondary | ICD-10-CM | POA: Diagnosis not present

## 2019-03-06 DIAGNOSIS — D631 Anemia in chronic kidney disease: Secondary | ICD-10-CM | POA: Diagnosis not present

## 2019-03-06 DIAGNOSIS — D509 Iron deficiency anemia, unspecified: Secondary | ICD-10-CM | POA: Diagnosis not present

## 2019-03-06 DIAGNOSIS — N2581 Secondary hyperparathyroidism of renal origin: Secondary | ICD-10-CM | POA: Diagnosis not present

## 2019-03-06 DIAGNOSIS — Z03818 Encounter for observation for suspected exposure to other biological agents ruled out: Secondary | ICD-10-CM | POA: Diagnosis not present

## 2019-03-06 DIAGNOSIS — N186 End stage renal disease: Secondary | ICD-10-CM | POA: Diagnosis not present

## 2019-03-06 DIAGNOSIS — Z992 Dependence on renal dialysis: Secondary | ICD-10-CM | POA: Diagnosis not present

## 2019-03-06 DIAGNOSIS — D472 Monoclonal gammopathy: Secondary | ICD-10-CM | POA: Diagnosis not present

## 2019-03-06 DIAGNOSIS — Z20828 Contact with and (suspected) exposure to other viral communicable diseases: Secondary | ICD-10-CM | POA: Diagnosis not present

## 2019-03-07 ENCOUNTER — Encounter: Payer: Self-pay | Admitting: Infectious Diseases

## 2019-03-07 ENCOUNTER — Ambulatory Visit (INDEPENDENT_AMBULATORY_CARE_PROVIDER_SITE_OTHER): Payer: Medicare Other | Admitting: Infectious Diseases

## 2019-03-07 ENCOUNTER — Other Ambulatory Visit: Payer: Self-pay

## 2019-03-07 VITALS — BP 154/104 | HR 63

## 2019-03-07 DIAGNOSIS — I871 Compression of vein: Secondary | ICD-10-CM

## 2019-03-07 DIAGNOSIS — Z113 Encounter for screening for infections with a predominantly sexual mode of transmission: Secondary | ICD-10-CM

## 2019-03-07 DIAGNOSIS — I10 Essential (primary) hypertension: Secondary | ICD-10-CM

## 2019-03-07 DIAGNOSIS — Z23 Encounter for immunization: Secondary | ICD-10-CM

## 2019-03-07 DIAGNOSIS — B20 Human immunodeficiency virus [HIV] disease: Secondary | ICD-10-CM | POA: Diagnosis not present

## 2019-03-07 DIAGNOSIS — Z79899 Other long term (current) drug therapy: Secondary | ICD-10-CM | POA: Diagnosis not present

## 2019-03-07 NOTE — Progress Notes (Signed)
   Subjective:    Patient ID: [name], female    DOB: 13-Sep-1950, 69 y.o.   MRN: [id number]  HPI 69 yo F with HIV+, ESRD, diastolic CHF, parox aflutter, prev normal cath at [facility] (2016).staying at SN[facility]). She is ontivicay/zidovudine/epivirdosed for her HD.  She was in hospital 2-15 to 2-18-18foroccluded right subclavian vein, with stenosis of right innominate vein. Patient seen by vascular surgery, pt had ballonangioplasty of right subclavian vein and innominate vein. Her RUE AVFwas again repaired 03-29-17. 103 # (per pt 48kg dry wt).   Is doing "so so". Having trouble with muscle spasms, especially in her hands. She states that her hands get cold, feel like needles and get numb.  Has been "sick a lot recently". Emesis.  She stays at [facility]. Has had COVID patients there.   CD4 246, HIV RNA not done. HIV Ab+. HCV RNA (-).  Na/K+ normal.  Ca 7.7, Alb 4.   HIV 1 RNA Quant (copies/mL)  Date Value  10/19/2017 <20 NOT DETECTED  04/22/2017 <20 NOT DETECTED  07/23/2015 51 (H)   CD4 T Cell Abs (/uL)  Date Value  10/19/2017 520  04/22/2017 450  07/23/2015 250 (L)    Review of Systems  Constitutional: Negative for appetite change, chills, fever and unexpected weight change.  Respiratory: Positive for shortness of breath (with HD). Negative for cough.   Cardiovascular: Negative for chest pain.  Gastrointestinal: Negative for constipation and diarrhea.  Neurological: Positive for headaches.  Psychiatric/Behavioral: Negative for sleep disturbance.  Please see HPI. All other systems reviewed and negative.      Objective:   Physical Exam Vitals reviewed.  Constitutional:      Appearance: She is not ill-appearing.  HENT:     Mouth/Throat:     Mouth: Mucous membranes are moist.     Pharynx: No oropharyngeal exudate.  Eyes:     Extraocular Movements: Extraocular movements intact.  Cardiovascular:     Rate and Rhythm: Normal rate and regular  rhythm.  Pulmonary:     Effort: Pulmonary effort is normal.     Breath sounds: Normal breath sounds.  Abdominal:     General: Abdomen is flat. Bowel sounds are normal.     Palpations: Abdomen is soft.  Musculoskeletal:       Arms:     Cervical back: Normal range of motion.     Right lower leg: No edema.     Left lower leg: No edema.  Neurological:     Mental Status: She is alert.  Psychiatric:        Mood and Affect: Mood normal.           Assessment & Plan:

## 2019-03-07 NOTE — Assessment & Plan Note (Signed)
She will f/u with PCP, renal MD.

## 2019-03-07 NOTE — Addendum Note (Signed)
Addended by: Carlean Purl on: 03/07/2019 04:40 PM   Modules accepted: Orders

## 2019-03-07 NOTE — Assessment & Plan Note (Addendum)
Has vascular f/u this week.  AVG appears intact.

## 2019-03-07 NOTE — Assessment & Plan Note (Signed)
Not clear if the sx in her hands are related to this or if she has other vascular phenomena.  Will defer to her PCP, renal to supplement.

## 2019-03-07 NOTE — Assessment & Plan Note (Signed)
Her CD4 is good.  Need to check her HIV RNA Has had flu vax this year.  Will continue her current meds [facility]ate[facility] to [facility].  rtc in 9 months.

## 2019-03-08 ENCOUNTER — Other Ambulatory Visit: Payer: Self-pay

## 2019-03-08 ENCOUNTER — Inpatient Hospital Stay (HOSPITAL_COMMUNITY): Payer: Medicare Other

## 2019-03-08 DIAGNOSIS — B20 Human immunodeficiency virus [HIV] disease: Secondary | ICD-10-CM | POA: Diagnosis not present

## 2019-03-08 DIAGNOSIS — D61818 Other pancytopenia: Secondary | ICD-10-CM | POA: Diagnosis not present

## 2019-03-08 DIAGNOSIS — N186 End stage renal disease: Secondary | ICD-10-CM

## 2019-03-08 DIAGNOSIS — D472 Monoclonal gammopathy: Secondary | ICD-10-CM | POA: Diagnosis not present

## 2019-03-08 DIAGNOSIS — D631 Anemia in chronic kidney disease: Secondary | ICD-10-CM | POA: Diagnosis not present

## 2019-03-08 DIAGNOSIS — N2581 Secondary hyperparathyroidism of renal origin: Secondary | ICD-10-CM | POA: Diagnosis not present

## 2019-03-08 DIAGNOSIS — I10 Essential (primary) hypertension: Secondary | ICD-10-CM | POA: Diagnosis not present

## 2019-03-08 DIAGNOSIS — D509 Iron deficiency anemia, unspecified: Secondary | ICD-10-CM | POA: Diagnosis not present

## 2019-03-08 DIAGNOSIS — Z992 Dependence on renal dialysis: Secondary | ICD-10-CM

## 2019-03-08 DIAGNOSIS — I480 Paroxysmal atrial fibrillation: Secondary | ICD-10-CM | POA: Diagnosis not present

## 2019-03-09 ENCOUNTER — Ambulatory Visit (INDEPENDENT_AMBULATORY_CARE_PROVIDER_SITE_OTHER): Payer: Medicare Other | Admitting: Family

## 2019-03-09 ENCOUNTER — Other Ambulatory Visit: Payer: Self-pay

## 2019-03-09 ENCOUNTER — Inpatient Hospital Stay (HOSPITAL_COMMUNITY): Payer: Medicare Other | Attending: Hematology

## 2019-03-09 ENCOUNTER — Ambulatory Visit (HOSPITAL_COMMUNITY)
Admission: RE | Admit: 2019-03-09 | Discharge: 2019-03-09 | Disposition: A | Discharge status: Home / Self Care | Payer: Medicare Other | Source: Ambulatory Visit | Attending: Family | Admitting: Family

## 2019-03-09 ENCOUNTER — Encounter: Payer: Self-pay | Admitting: Family

## 2019-03-09 VITALS — BP 147/96 | HR 55 | Temp 97.6°F | Resp 16 | Ht 62.0 in | Wt 104.0 lb

## 2019-03-09 DIAGNOSIS — N186 End stage renal disease: Secondary | ICD-10-CM | POA: Diagnosis not present

## 2019-03-09 DIAGNOSIS — I770 Arteriovenous fistula, acquired: Secondary | ICD-10-CM

## 2019-03-09 DIAGNOSIS — Z992 Dependence on renal dialysis: Secondary | ICD-10-CM | POA: Insufficient documentation

## 2019-03-09 DIAGNOSIS — I120 Hypertensive chronic kidney disease with stage 5 chronic kidney disease or end stage renal disease: Secondary | ICD-10-CM | POA: Insufficient documentation

## 2019-03-09 DIAGNOSIS — R29898 Other symptoms and signs involving the musculoskeletal system: Secondary | ICD-10-CM | POA: Diagnosis not present

## 2019-03-09 DIAGNOSIS — D61818 Other pancytopenia: Secondary | ICD-10-CM | POA: Diagnosis not present

## 2019-03-09 DIAGNOSIS — Z21 Asymptomatic human immunodeficiency virus [HIV] infection status: Secondary | ICD-10-CM | POA: Diagnosis not present

## 2019-03-09 DIAGNOSIS — R202 Paresthesia of skin: Secondary | ICD-10-CM

## 2019-03-09 DIAGNOSIS — Z87891 Personal history of nicotine dependence: Secondary | ICD-10-CM | POA: Insufficient documentation

## 2019-03-09 DIAGNOSIS — R208 Other disturbances of skin sensation: Secondary | ICD-10-CM | POA: Diagnosis not present

## 2019-03-09 LAB — CBC WITH DIFFERENTIAL/PLATELET
Abs Immature Granulocytes: 0.01 10*3/uL (ref 0.00–0.07)
Basophils Absolute: 0 10*3/uL (ref 0.0–0.1)
Basophils Relative: 0 %
Eosinophils Absolute: 0.1 10*3/uL (ref 0.0–0.5)
Eosinophils Relative: 3 %
HCT: 39.6 % (ref 36.0–46.0)
Hemoglobin: 11.8 g/dL — ABNORMAL LOW (ref 12.0–15.0)
Immature Granulocytes: 0 %
Lymphocytes Relative: 33 %
Lymphs Abs: 1.1 10*3/uL (ref 0.7–4.0)
MCH: 33.8 pg (ref 26.0–34.0)
MCHC: 29.8 g/dL — ABNORMAL LOW (ref 30.0–36.0)
MCV: 113.5 fL — ABNORMAL HIGH (ref 80.0–100.0)
Monocytes Absolute: 0.3 10*3/uL (ref 0.1–1.0)
Monocytes Relative: 10 %
Neutro Abs: 1.7 10*3/uL (ref 1.7–7.7)
Neutrophils Relative %: 54 %
Platelets: 125 10*3/uL — ABNORMAL LOW (ref 150–400)
RBC: 3.49 MIL/uL — ABNORMAL LOW (ref 3.87–5.11)
RDW: 13.8 % (ref 11.5–15.5)
WBC: 3.2 10*3/uL — ABNORMAL LOW (ref 4.0–10.5)
nRBC: 0 % (ref 0.0–0.2)

## 2019-03-09 LAB — IRON AND TIBC
Iron: 53 ug/dL (ref 28–170)
Saturation Ratios: 28 % (ref 10.4–31.8)
TIBC: 190 ug/dL — ABNORMAL LOW (ref 250–450)
UIBC: 137 ug/dL

## 2019-03-09 LAB — VITAMIN B12: Vitamin B-12: 2231 pg/mL — ABNORMAL HIGH (ref 180–914)

## 2019-03-09 LAB — FERRITIN: Ferritin: 873 ng/mL — ABNORMAL HIGH (ref 11–307)

## 2019-03-09 NOTE — Progress Notes (Signed)
CC: paresthesia, cold sensation in both hands, symptoms worse in right hand, increasing weakness and loss of dexterity in right hand  History of Present Illness  [name] is a 69 y.o. ([birth year]) female who is s/p right upper extremity and central fistulagram, stenting of right innominate vein with 10 x 29 mm balloon expandable bare-metal stent, and drug-coated balloon angioplasty of right subclavian vein with 12 mm lutonix on 03-14-18 by Dr. Donzetta Matters for significant swelling of the right arm. She has had multiple interventions of her right innominate and subclavian veins.    She returns today with c/o cold sensation, paresthesia in both hands, occasional swelling in right hand, for about a year, worsening per pt.   She is right hand dominant, and is concerned about losing the use of her right hand, currently has trouble writing and picking up things with right hand, left hand less so. She states that she feels pressure in her right wrist.   It hurts to wash her hands in any temperature of water.  She is wondering why the above symptoms alternates in both upper extremities.   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 She denies any bleeding issues from her AVF, denies any problems during dialysis.   She dialyzes MWF via right forearm AVF at [facility] in [place].   She denies fever or chills. She states occasional mild dyspnea, denies chest pain.   She lives in a [place], [place], in [place]. She states this is her permanent home.    Past Medical History:  Diagnosis Date  . Anemia   . Anxiety   . Atrial flutter ([place]) 02/22/2015  . C. difficile colitis 10/10/11  . Chronic diarrhea   . Depression   . ESRD on hemodialysis ([place])   . GERD (gastroesophageal reflux disease)   . HIV (human immunodeficiency virus infection) ([place])   . Hypertension   . Insomnia   . Intracranial hemorrhage ([place])    . Muscle weakness (generalized)   . Pulmonary HTN ([place])   . Tachycardia   . TIA (transient ischemic attack)   . Traumatic hematoma of right upper arm 02/22/2015    Social History Social History   Tobacco Use  . Smoking status: Former Smoker    Quit date: 03/12/1983    Years since quitting: 36.0  . Smokeless tobacco: Never Used  Substance Use Topics  . Alcohol use: No    Alcohol/week: 0.0 standard drinks  . Drug use: No    Family History Family History  Problem Relation Age of Onset  . Anesthesia problems Neg Hx   . Hypotension Neg Hx   . Malignant hyperthermia Neg Hx   . Pseudochol deficiency Neg Hx     Surgical History Past Surgical History:  Procedure Laterality Date  . A/V FISTULAGRAM N/A 04/17/2016   Procedure: A/V Fistulagram - Right Upper;  Surgeon: Waynetta Sandy, MD;  Location: [facility];  Service: Cardiovascular;  Laterality: N/A;  . BIOPSY THYROID    . Dialysis Shunts     previous one removed from left arm and now present one in right arm  . DIALYSIS/PERMA CATHETER INSERTION Right 04/17/2016   Procedure: dialysis Catheter Insertion central veinous;  Surgeon: Waynetta Sandy, MD;  Location: [facility];  Service: Cardiovascular;  Laterality: Right;  . ESOPHAGOGASTRODUODENOSCOPY  12/15/2010   Procedure: ESOPHAGOGASTRODUODENOSCOPY (EGD);  Surgeon: Daneil Dolin,  MD;  [facility];  Service: Endoscopy;  Laterality: N/A;  . EXCISION OF BREAST BIOPSY Right 05/04/2012   Procedure: EXCISION OF BREAST BIOPSY;  Surgeon: Donato Heinz, MD;  Location: [facility];  Service: General;  Laterality: Right;  Right Excisional Breast Biopsy  . FISTULOGRAM Right 03/26/2014   Procedure: Right Arm Fistulogram with Venoplasty Right Subclavian Vein and Inominate Vein. Debridement Fistula Ulcer;  Surgeon: Conrad Whittingham, MD;  Location: [facility];  Service: Vascular;  Laterality: Right;  . FISTULOGRAM Right 03/29/2017   Procedure: FISTULOGRAM COMPLEX  RIGHT ARM with Balloon angioplasty;  Surgeon: Waynetta Sandy, MD;  Location: [facility];  Service: Vascular;  Laterality: Right;  . IR GENERIC HISTORICAL  05/19/2016   IR REMOVAL TUN CV CATH W/O FL 05/19/2016 Markus Daft, MD [facility]-INTER[facility] RAD  . PERIPHERAL VASCULAR BALLOON ANGIOPLASTY Right 04/17/2016   Procedure: Peripheral Vascular Balloon Angioplasty;  Surgeon: Waynetta Sandy, MD;  Location: [facility];  Service: Cardiovascular;  Laterality: Right;  Central segment AV Fistula  . PERIPHERAL VASCULAR BALLOON ANGIOPLASTY Right 03/14/2018   Procedure: PERIPHERAL VASCULAR BALLOON ANGIOPLASTY;  Surgeon: Waynetta Sandy, MD;  Location: [facility];  Service: Cardiovascular;  Laterality: Right;  subclavian vein  . PERIPHERAL VASCULAR INTERVENTION Right 03/14/2018   Procedure: PERIPHERAL VASCULAR INTERVENTION;  Surgeon: Waynetta Sandy, MD;  Location: [facility];  Service: Cardiovascular;  Laterality: Right;  subclavian vein  . SHUNTOGRAM Right 10/19/2013   Procedure: FISTULOGRAM;  Surgeon: Conrad B[place], MD;  Location: [facility];  Service: Cardiovascular;  Laterality: Right;  . TONSILLECTOMY    . VISCERAL VENOGRAPHY Right 03/14/2018   Procedure: CENTRAL VENOGRAPHY;  Surgeon: Waynetta Sandy, MD;  Location: [facility];  Service: Cardiovascular;  Laterality: Right;  upper ext fistula    Allergies  Allergen Reactions  . Lactose Intolerance (Gi) Other (See Comments)    On MAR  . Latex Rash and Itching  . Penicillins Rash and Swelling    Has patient had a PCN reaction causing immediate rash, facial/tongue/throat swelling, SOB or lightheadedness with hypotension: No Has patient had a PCN reaction causing severe rash involving mucus membranes or skin necrosis: No Has patient had a PCN reaction that required hospitalization No Has patient had a PCN reaction occurring within the last 10 years: No If all of the above answers are "NO", then  may proceed with Cephalosporin use.      Current Outpatient Medications  Medication Sig Dispense Refill  . acetaminophen (TYLENOL) 325 MG tablet Take 650 mg by mouth every 4 (four) hours as needed for mild pain or fever.    Marland Kitchen alum & mag hydroxide-simeth (MAALOX/MYLANTA) 200-200-20 MG/5ML suspension Take 30 mLs by mouth every 2 (two) hours as needed for indigestion or heartburn. Max 4 doses per 24 hours    . Biotin 5000 MCG CAPS Take by mouth.    . bisacodyl (FLEET) 10 MG/30ML ENEM Place 10 mg rectally daily as needed (constipation).    . cinacalcet (SENSIPAR) 30 MG tablet Take 60 mg by mouth at bedtime.     . clopidogrel (PLAVIX) 75 MG tablet Take 1 tablet (75 mg total) by mouth daily. 30 tablet 3  . diltiazem (CARDIZEM) 60 MG tablet Take 60 mg by mouth 2 (two) times daily.    . dolutegravir (TIVICAY) 50 MG tablet Take 50 mg by mouth daily.    . fentaNYL (DURAGESIC - DOSED MCG/HR) 50 MCG/HR Apply one patch every 72  hours for pain. Remove old patch. External use only. Rotate sites. (Patient taking differently: Place 50 mcg onto the skin every 3 (three) days. Remove old patch. External use only. Rotate sites.) 10 patch 0  . glucagon (GLUCAGON EMERGENCY) 1 MG injection Inject 1 mg into the vein once as needed (if bs is < 60 and pt is unconscious).    Marland Kitchen ipratropium-albuterol (DUONEB) 0.5-2.5 (3) MG/3ML SOLN     . lamivudine (EPIVIR) 100 MG tablet Take 50 mg by mouth daily.    Marland Kitchen lidocaine-prilocaine (EMLA) cream Apply 1 application topically 3 (three) times a week. Apply 1 hour prior to dialysis access site on Monday Wednesday and Friday    . metoprolol (LOPRESSOR) 50 MG tablet Take 50 mg by mouth 2 (two) times daily.    . multivitamin (RENA-VIT) TABS tablet Take 1 tablet by mouth at bedtime.    . Nutritional Supplements (NOVASOURCE RENAL) LIQD Take 237 mLs by mouth 3 (three) times daily.    . ondansetron (ZOFRAN-ODT) 4 MG disintegrating tablet Take 4 mg by mouth every 4 (four) hours as needed for  nausea or vomiting. If not resolved after 2 doses then take promethazine 25 mg    . Oxycodone HCl 10 MG TABS Take 10 mg by mouth every 12 (twelve) hours as needed (severe pain).    . promethazine (PHENERGAN) 12.5 MG tablet Take 12.5 mg by mouth every Monday, Wednesday, and Friday with hemodialysis. May take an additional 12.5 mg dose every 6 hours as needed for nausea    . sertraline (ZOLOFT) 25 MG tablet Take 25 mg by mouth daily. Take 25 mg with the 100 mg.    . SERTRALINE HCL PO Take 100 mg by mouth daily.     . sevelamer carbonate (RENVELA) 800 MG tablet Take 2,400 mg by mouth 3 (three) times daily with meals.     . sildenafil (REVATIO) 20 MG tablet Take 20 mg by mouth 3 (three) times daily.     . vitamin C (ASCORBIC ACID) 500 MG tablet Take 500 mg by mouth 2 (two) times daily.    . zaleplon (SONATA) 5 MG capsule Take one capsule by mouth every night at bedtime for rest (Patient taking differently: Take 5 mg by mouth at bedtime. ) 30 capsule 0  . zidovudine (RETROVIR) 100 MG capsule Take 100 mg by mouth 3 (three) times daily.       Current Facility-Administered Medications  Medication Dose Route Frequency Provider Last Rate Last Admin  . clopidogrel (PLAVIX) tablet 300 mg  300 mg Oral Once Waynetta Sandy, MD         REVIEW OF SYSTEMS: see HPI for pertinent positives and negatives    PHYSICAL EXAMINATION:  Vitals:   03/09/19 1350  BP: (!) 147/96  Pulse: (!) 55  Resp: 16  Temp: 97.6 F (36.4 C)  TempSrc: Oral  SpO2: 94%  Weight: 104 lb (47.2 kg)  Height: 5\' 2"  (1.575 m)   Body mass index is 19.02 kg/m.  General: Petite female in NAD HEENT:  No gross abnormalities Pulmonary: Respirations are non-labored, good air movement in all fields, no rales, rhonchi, or wheezes.  Abdomen: Soft and non-tender with normal bowel sounds. Musculoskeletal: There are no major deformities.   Neurologic: No focal weakness or paresthesias are detected, right hand grip strength is  3-4/5, left hand grip strength is 4-5/5 Skin: There are no ulcer or rashes noted. Psychiatric: The patient has normal affect. Cardiovascular: There is a regular rate and  rhythm without significant murmur appreciated.   Upper extremity pulses: Faintly palpable right radial, right ulnar pulse is not palpable. 1-2+ palpable left radial pulse. No swelling in either hand or arm. Small veins veins are apparent and distended at the base of each thumb, palmer surface. Left upper arm AVF with no bruit, no thrill (she dialyzes via right upper arm AVF). Right forearm AVF is no longer used for HD, but distal aspect of AVF has a palpable thrill and audible bruit.  Left upper arm with aneurysmal AVF, is no longer used, the 2 aneurysmal sections feel hard, as if occluded.    Non-Invasive Vascular Imaging  Right arm Access Duplex  (Date: 03/09/2019):  Findings: +--------------------+----------+-----------------+--------+ AVF                 PSV (cm/s)Flow Vol (mL/min)Comments +--------------------+----------+-----------------+--------+ Native artery inflow   177          1160                +--------------------+----------+-----------------+--------+ AVF Anastomosis        342                              +--------------------+----------+-----------------+--------+ +---------------+----------+-------------+----------+---------------------+ OUTFLOW VEIN   PSV (cm/s)Diameter (cm)Depth (cm)      Describe        +---------------+----------+-------------+----------+---------------------+ Subclavian vein   100                                                 +---------------+----------+-------------+----------+---------------------+ Shoulder           68                                                 +---------------+----------+-------------+----------+---------------------+ Mid UA             24                                                  +---------------+----------+-------------+----------+---------------------+ Dist UA            58                                                 +---------------+----------+-------------+----------+---------------------+ Prox Forearm       53        1.01                                     +---------------+----------+-------------+----------+---------------------+ Mid Forearm        60        1.03        0.27   branch 0.65 cm 49cm/s +---------------+----------+-------------+----------+---------------------+ Dist Forearm      124        1.45        0.28                         +---------------+----------+-------------+----------+---------------------+  Innominate vein stent patent.  Summary: 1. Patent radio-cephalic AVF. 2. Tortuous and ectatic throughout the upper arm. 3. Patnet innomitate vein stent.    Medical Decision Making  ALEJANDRA HUNT is a 69 y.o. female who is s/p right upper extremity and central fistulagram, stenting of right innominate vein with 10 x 29 mm balloon expandable bare-metal stent, and drug-coated balloon angioplasty of right subclavian vein with 12 mm lutonix on 03-14-18 by Dr. Donzetta Matters for significant swelling of the right arm. She has had multiple interventions of her right innominate and subclavian veins.     She c/o cold sensation and paresthesia in both hands, occasional swelling in right hand, for about a year, worsening per pt.   She is right hand dominant, and is concerned about losing the use of her right hand, currently has trouble writing and picking up things with right hand, left hand less so. She states that she feels pressure in her right wrist.   It hurts to wash her hands in any temperature of water.  She is wondering why the above symptoms alternates in both upper extremities.    I discussed with Dr. Oneida Alar pt HPI, physical exam results, and right arm AVF duplex results.  Sometimes the presence of an access in the upper  extremity can cause carpal tunnel syndrome.   Will refer to hand surgeon for evaluation of possible bilateral carpal tunnel syndrome. Follow up in 3 months with Dr. Oneida Alar (not APP), no testing necessary, to evaluate pt after she is seen by hand surgeon.    Clemon Chambers, RN, MSN, FNP-C [facility] Office: [phone]  03/09/2019, 1:58 PM  Clinic MD: Laqueta Due

## 2019-03-10 DIAGNOSIS — D472 Monoclonal gammopathy: Secondary | ICD-10-CM | POA: Diagnosis not present

## 2019-03-10 DIAGNOSIS — N186 End stage renal disease: Secondary | ICD-10-CM | POA: Diagnosis not present

## 2019-03-10 DIAGNOSIS — N2581 Secondary hyperparathyroidism of renal origin: Secondary | ICD-10-CM | POA: Diagnosis not present

## 2019-03-10 DIAGNOSIS — D509 Iron deficiency anemia, unspecified: Secondary | ICD-10-CM | POA: Diagnosis not present

## 2019-03-10 DIAGNOSIS — D631 Anemia in chronic kidney disease: Secondary | ICD-10-CM | POA: Diagnosis not present

## 2019-03-10 DIAGNOSIS — Z992 Dependence on renal dialysis: Secondary | ICD-10-CM | POA: Diagnosis not present

## 2019-03-10 LAB — HIV-1 RNA QUANT-NO REFLEX-BLD
HIV 1 RNA Quant: <20 copies/mL
HIV-1 RNA Quant, Log: <1.3 Log copies/mL

## 2019-03-13 DIAGNOSIS — N186 End stage renal disease: Secondary | ICD-10-CM | POA: Diagnosis not present

## 2019-03-13 DIAGNOSIS — D631 Anemia in chronic kidney disease: Secondary | ICD-10-CM | POA: Diagnosis not present

## 2019-03-13 DIAGNOSIS — Z992 Dependence on renal dialysis: Secondary | ICD-10-CM | POA: Diagnosis not present

## 2019-03-13 DIAGNOSIS — D472 Monoclonal gammopathy: Secondary | ICD-10-CM | POA: Diagnosis not present

## 2019-03-13 DIAGNOSIS — N2581 Secondary hyperparathyroidism of renal origin: Secondary | ICD-10-CM | POA: Diagnosis not present

## 2019-03-13 DIAGNOSIS — D509 Iron deficiency anemia, unspecified: Secondary | ICD-10-CM | POA: Diagnosis not present

## 2019-03-13 LAB — METHYLMALONIC ACID, SERUM: Methylmalonic Acid, Quantitative: 551 nmol/L — ABNORMAL HIGH (ref 0–378)

## 2019-03-15 ENCOUNTER — Ambulatory Visit (HOSPITAL_COMMUNITY): Payer: Medicare Other | Admitting: Hematology

## 2019-03-15 DIAGNOSIS — Z23 Encounter for immunization: Secondary | ICD-10-CM | POA: Diagnosis not present

## 2019-03-15 DIAGNOSIS — F331 Major depressive disorder, recurrent, moderate: Secondary | ICD-10-CM | POA: Diagnosis not present

## 2019-03-15 DIAGNOSIS — F5101 Primary insomnia: Secondary | ICD-10-CM | POA: Diagnosis not present

## 2019-03-16 ENCOUNTER — Other Ambulatory Visit: Payer: Self-pay

## 2019-03-16 ENCOUNTER — Inpatient Hospital Stay (HOSPITAL_BASED_OUTPATIENT_CLINIC_OR_DEPARTMENT_OTHER): Payer: Medicare Other | Admitting: Nurse Practitioner

## 2019-03-16 DIAGNOSIS — N186 End stage renal disease: Secondary | ICD-10-CM | POA: Diagnosis not present

## 2019-03-16 DIAGNOSIS — D61818 Other pancytopenia: Secondary | ICD-10-CM | POA: Diagnosis not present

## 2019-03-16 DIAGNOSIS — Z21 Asymptomatic human immunodeficiency virus [HIV] infection status: Secondary | ICD-10-CM | POA: Diagnosis not present

## 2019-03-16 DIAGNOSIS — Z87891 Personal history of nicotine dependence: Secondary | ICD-10-CM | POA: Diagnosis not present

## 2019-03-16 DIAGNOSIS — Z992 Dependence on renal dialysis: Secondary | ICD-10-CM | POA: Diagnosis not present

## 2019-03-16 DIAGNOSIS — I120 Hypertensive chronic kidney disease with stage 5 chronic kidney disease or end stage renal disease: Secondary | ICD-10-CM | POA: Diagnosis not present

## 2019-03-16 NOTE — Assessment & Plan Note (Addendum)
1.  Pancytopenia: -CBC on 09/20/2018 showed white count 2.6, hemoglobin 9.4 and MCV 100, platelet count 128.  Differential showed 44% neutrophils. -Repeat CBC in our office on 12/08/2018 showed white count 3.4 with 60% neutrophils.  Hemoglobin improved to 10.8 and platelet count was normal at 172. -B12 was normal with elevated methylmalonic acid of 700.  I have given a B12 shot today in the office.  She is taking B12 1 mg tablet daily. -We have done infectious disease panel including CMV, EBV and parvo B19 and hepatitis panel which were negative.  EBV IgG antibody was positive but IgM was negative. -Labs done on 03/09/2019 showed hemoglobin 11.8, WBC 3.2, platelets 125, ferritin 873, percent saturation 28, vitamin B12 level 2231. -She will follow-up in 4 months with repeat labs and B12 levels.  2.  HIV: -She is on Epivir 50 milligrams daily, Retrovir 100 mg 3 times a day and Tivicay 50 mg daily. -CD4 count and viral load are done periodically by infectious disease.  3. ESRD: -She undergoes hemodialysis on Mondays, Wednesdays and Fridays.  4.  Chronic pain: -She is on fentanyl 50 mcg and oxycodone 10 mg twice daily as needed.

## 2019-03-16 NOTE — Patient Instructions (Signed)
[facility] at [facility] Discharge Instructions  Follow up in 4 months with labs    Thank you for choosing [facility] at [facility] to provide your oncology and hematology care.  To afford each patient quality time with our provider, please arrive at least 15 minutes before your scheduled appointment time.   If you have a lab appointment with the Cancer Center please come in thru the Main Entrance and check in at the main information desk.  You need to re-schedule your appointment should you arrive 10 or more minutes late.  We strive to give you quality time with our providers, and arriving late affects you and other patients whose appointments are after yours.  Also, if you no show three or more times for appointments you may be dismissed from the clinic at the providers discretion.     Again, thank you for choosing [facility].  Our hope is that these requests will decrease the amount of time that you wait before being seen by our physicians.       _____________________________________________________________  Should you have questions after your visit to [facility], please contact our office at [phone] between the hours of 8:00 a.m. and 4:30 p.m.  Voicemails left after 4:00 p.m. will not be returned until the following business day.  For prescription refill requests, have your pharmacy contact our office and allow 72 hours.    Due to Covid, you will need to wear a mask upon entering the hospital. If you do not have a mask, a mask will be given to you at the Main Entrance upon arrival. For doctor visits, patients may have 1 support person with them. For treatment visits, patients can not have anyone with them due to social distancing guidelines and our immunocompromised population.      

## 2019-03-16 NOTE — Progress Notes (Signed)
[place], [place] [address]   CLINIC:  Medical Oncology/Hematology  PCP:  Hilbert Corrigan, [place] [id number] [phone]   REASON FOR VISIT: Follow-up for pancytopenia  CURRENT THERAPY: Observation   INTERVAL HISTORY:  [name] 69 y.o. female returns for routine follow-up for pancytopenia.  She reports she has been doing well since her last visit.  She does report she gets short of breath with exertion.  And she occasionally has headaches.  She is fatigued most of the day and is not very active. Denies any nausea, vomiting, or diarrhea. Denies any new pains. Had not noticed any recent bleeding such as epistaxis, hematuria or hematochezia. Denies recent chest pain on exertion, pre-syncopal episodes, or palpitations. Denies any numbness or tingling in hands or feet. Denies any recent fevers, infections, or recent hospitalizations. Patient reports appetite at 75% and energy level at 25%.  She is eating well maintain her weight at this time.   REVIEW OF SYSTEMS:  Review of Systems  Constitutional: Positive for fatigue.  Respiratory: Positive for shortness of breath (With exertion).   Neurological: Positive for headaches.  Psychiatric/Behavioral: Positive for depression. The patient is nervous/anxious.   All other systems reviewed and are negative.    PAST MEDICAL/SURGICAL HISTORY:  Past Medical History:  Diagnosis Date  . Anemia   . Anxiety   . Atrial flutter ([facility]) 02/22/2015  . C. difficile colitis 10/10/11  . Chronic diarrhea   . Depression   . ESRD on hemodialysis ([facility])   . GERD (gastroesophageal reflux disease)   . HIV (human immunodeficiency virus infection) ([facility])   . Hypertension   . Insomnia   . Intracranial hemorrhage ([facility])   . Muscle weakness (generalized)   . Pulmonary HTN ([facility])   . Tachycardia   . TIA (transient ischemic attack)   . Traumatic hematoma of right upper arm 02/22/2015   Past  Surgical History:  Procedure Laterality Date  . A/V FISTULAGRAM N/A 04/17/2016   Procedure: A/V Fistulagram - Right Upper;  Surgeon: Waynetta Sandy, MD;  Location: [facility];  Service: Cardiovascular;  Laterality: N/A;  . BIOPSY THYROID    . Dialysis Shunts     previous one removed from left arm and now present one in right arm  . DIALYSIS/PERMA CATHETER INSERTION Right 04/17/2016   Procedure: dialysis Catheter Insertion central veinous;  Surgeon: Waynetta Sandy, MD;  Location: [facility];  Service: Cardiovascular;  Laterality: Right;  . ESOPHAGOGASTRODUODENOSCOPY  12/15/2010   Procedure: ESOPHAGOGASTRODUODENOSCOPY (EGD);  Surgeon: Daneil Dolin, MD;  [facility];  Service: Endoscopy;  Laterality: N/A;  . EXCISION OF BREAST BIOPSY Right 05/04/2012   Procedure: EXCISION OF BREAST BIOPSY;  Surgeon: Donato Heinz, MD;  Location: [facility];  Service: General;  Laterality: Right;  Right Excisional Breast Biopsy  . FISTULOGRAM Right 03/26/2014   Procedure: Right Arm Fistulogram with Venoplasty Right Subclavian Vein and Inominate Vein. Debridement Fistula Ulcer;  Surgeon: Conrad Sutter, MD;  Location: [facility];  Service: Vascular;  Laterality: Right;  . FISTULOGRAM Right 03/29/2017   Procedure: FISTULOGRAM COMPLEX RIGHT ARM with Balloon angioplasty;  Surgeon: Waynetta Sandy, MD;  Location: [facility];  Service: Vascular;  Laterality: Right;  . IR GENERIC HISTORICAL  05/19/2016   IR REMOVAL TUN CV CATH W/O FL 05/19/2016 Markus Daft, MD [facility]  . PERIPHERAL VASCULAR BALLOON ANGIOPLASTY Right 04/17/2016   Procedure: Peripheral Vascular Balloon  Angioplasty;  Surgeon: Waynetta Sandy, MD;  Location: [facility];  Service: Cardiovascular;  Laterality: Right;  Central segment AV Fistula  . PERIPHERAL VASCULAR BALLOON ANGIOPLASTY Right 03/14/2018   Procedure: PERIPHERAL VASCULAR BALLOON ANGIOPLASTY;  Surgeon: Waynetta Sandy, MD;   Location: [facility];  Service: Cardiovascular;  Laterality: Right;  subclavian vein  . PERIPHERAL VASCULAR INTERVENTION Right 03/14/2018   Procedure: PERIPHERAL VASCULAR INTERVENTION;  Surgeon: Waynetta Sandy, MD;  Location: [facility];  Service: Cardiovascular;  Laterality: Right;  subclavian vein  . SHUNTOGRAM Right 10/19/2013   Procedure: FISTULOGRAM;  Surgeon: Conrad [place], MD;  Location: [facility] C[facility] LAB;  Service: Cardiovascular;  Laterality: Right;  . TONSILLECTOMY    . VISCERAL VENOGRAPHY Right 03/14/2018   Procedure: CENTRAL VENOGRAPHY;  Surgeon: Waynetta Sandy, MD;  Location: [facility];  Service: Cardiovascular;  Laterality: Right;  upper ext fistula     SOCIAL HISTORY:  Social History   Socioeconomic History  . Marital status: Widowed    Spouse name: Not on file  . Number of children: 1  . Years of education: Not on file  . Highest education level: Not on file  Occupational History  . Not on file  Tobacco Use  . Smoking status: Former Smoker    Quit date: 03/12/1983    Years since quitting: 36.0  . Smokeless tobacco: Never Used  Substance and Sexual Activity  . Alcohol use: No    Alcohol/week: 0.0 standard drinks  . Drug use: No  . Sexual activity: Not Currently  Other Topics Concern  . Not on file  Social History Narrative  . Not on file   Social Determinants of Health   Financial Resource Strain:   . Difficulty of Paying Living Expenses: Not on file  Food Insecurity:   . Worried About Charity fundraiser in the Last Year: Not on file  . Ran Out of Food in the Last Year: Not on file  Transportation Needs:   . Lack of Transportation (Medical): Not on file  . Lack of Transportation (Non-Medical): Not on file  Physical Activity:   . Days of Exercise per Week: Not on file  . Minutes of Exercise per Session: Not on file  Stress:   . Feeling of Stress : Not on file  Social Connections:   . Frequency of Communication  with Friends and Family: Not on file  . Frequency of Social Gatherings with Friends and Family: Not on file  . Attends Religious Services: Not on file  . Active Member of Clubs or Organizations: Not on file  . Attends Archivist Meetings: Not on file  . Marital Status: Not on file  Intimate Partner Violence:   . Fear of Current or Ex-Partner: Not on file  . Emotionally Abused: Not on file  . Physically Abused: Not on file  . Sexually Abused: Not on file    FAMILY HISTORY:  Family History  Problem Relation Age of Onset  . Anesthesia problems Neg Hx   . Hypotension Neg Hx   . Malignant hyperthermia Neg Hx   . Pseudochol deficiency Neg Hx     CURRENT MEDICATIONS:  Outpatient Encounter Medications as of 03/16/2019  Medication Sig Note  . Biotin 5000 MCG CAPS Take by mouth.   . cinacalcet (SENSIPAR) 30 MG tablet Take 60 mg by mouth at bedtime.    . clopidogrel (PLAVIX) 75 MG tablet Take 1 tablet (75 mg total) by  mouth daily.   Marland Kitchen diltiazem (CARDIZEM) 60 MG tablet Take 60 mg by mouth 2 (two) times daily.   . dolutegravir (TIVICAY) 50 MG tablet Take 50 mg by mouth daily.   . fentaNYL (DURAGESIC - DOSED MCG/HR) 50 MCG/HR Apply one patch every 72 hours for pain. Remove old patch. External use only. Rotate sites. (Patient taking differently: Place 50 mcg onto the skin every 3 (three) days. Remove old patch. External use only. Rotate sites.)   . lamivudine (EPIVIR) 100 MG tablet Take 50 mg by mouth daily.   . Lidocaine HCl (ASPERCREME W/LIDOCAINE) 4 % CREA Apply topically 2 (two) times daily as needed.   . metoprolol (LOPRESSOR) 50 MG tablet Take 50 mg by mouth 2 (two) times daily.   . multivitamin (RENA-VIT) TABS tablet Take 1 tablet by mouth at bedtime.   . Nutritional Supplements (NOVASOURCE RENAL) LIQD Take 237 mLs by mouth 3 (three) times daily.   . pantoprazole (PROTONIX) 20 MG tablet Take 20 mg by mouth daily.   . sertraline (ZOLOFT) 25 MG tablet Take 25 mg by mouth daily.  Take 25 mg with the 100 mg.   . SERTRALINE HCL PO Take 100 mg by mouth daily.  03/07/2018: MAR doesn't specify the strength of the tablet  . sevelamer carbonate (RENVELA) 800 MG tablet Take 2,400 mg by mouth 3 (three) times daily with meals.    . sildenafil (REVATIO) 20 MG tablet Take 20 mg by mouth 3 (three) times daily.    . vitamin B-12 (CYANOCOBALAMIN) 100 MCG tablet Take 100 mcg by mouth daily.   . vitamin C (ASCORBIC ACID) 500 MG tablet Take 500 mg by mouth 2 (two) times daily.   . zaleplon (SONATA) 5 MG capsule Take one capsule by mouth every night at bedtime for rest (Patient taking differently: Take 5 mg by mouth at bedtime. )   . zidovudine (RETROVIR) 100 MG capsule Take 100 mg by mouth 3 (three) times daily.     Marland Kitchen acetaminophen (TYLENOL) 325 MG tablet Take 650 mg by mouth every 4 (four) hours as needed for mild pain or fever. 03/08/2018: Standing order, not listed under medications   . alum & mag hydroxide-simeth (MAALOX/MYLANTA) 200-200-20 MG/5ML suspension Take 30 mLs by mouth every 2 (two) hours as needed for indigestion or heartburn. Max 4 doses per 24 hours   . bisacodyl (FLEET) 10 MG/30ML ENEM Place 10 mg rectally daily as needed (constipation). 03/08/2018: Standing order, not listed under medications   . glucagon (GLUCAGON EMERGENCY) 1 MG injection Inject 1 mg into the vein once as needed (if bs is < 60 and pt is unconscious). 03/08/2018: Standing order, not listed under medications   . ipratropium-albuterol (DUONEB) 0.5-2.5 (3) MG/3ML SOLN    . lidocaine-prilocaine (EMLA) cream Apply 1 application topically 3 (three) times a week. Apply 1 hour prior to dialysis access site on Monday Wednesday and Friday   . ondansetron (ZOFRAN-ODT) 4 MG disintegrating tablet Take 4 mg by mouth every 4 (four) hours as needed for nausea or vomiting. If not resolved after 2 doses then take promethazine 25 mg 03/08/2018: Standing order, not listed under medications   . Oxycodone HCl 10 MG TABS Take 10 mg by mouth  every 12 (twelve) hours as needed (severe pain).   . promethazine (PHENERGAN) 12.5 MG tablet Take 12.5 mg by mouth every Monday, Wednesday, and Friday with hemodialysis. May take an additional 12.5 mg dose every 6 hours as needed for nausea    Facility-Administered Encounter  Medications as of 03/16/2019  Medication  . clopidogrel (PLAVIX) tablet 300 mg    ALLERGIES:  Allergies  Allergen Reactions  . Lactose Intolerance (Gi) Other (See Comments)    On MAR  . Latex Rash and Itching  . Penicillins Rash and Swelling    Has patient had a PCN reaction causing immediate rash, facial/tongue/throat swelling, SOB or lightheadedness with hypotension: No Has patient had a PCN reaction causing severe rash involving mucus membranes or skin necrosis: No Has patient had a PCN reaction that required hospitalization No Has patient had a PCN reaction occurring within the last 10 years: No If all of the above answers are "NO", then may proceed with Cephalosporin use.       PHYSICAL EXAM:  ECOG Performance status: 1  Vitals:   03/16/19 1156  BP: (!) 148/80  Pulse: 65  Resp: 18  Temp: 97.7 F (36.5 C)  SpO2: 97%   Filed Weights   03/16/19 1156  Weight: 112 lb (50.8 kg)    Physical Exam Constitutional:      Appearance: Normal appearance. She is normal weight.  Cardiovascular:     Rate and Rhythm: Normal rate and regular rhythm.     Heart sounds: Normal heart sounds.  Pulmonary:     Effort: Pulmonary effort is normal.     Breath sounds: Normal breath sounds.  Abdominal:     General: Bowel sounds are normal.     Palpations: Abdomen is soft.  Musculoskeletal:        General: Normal range of motion.  Skin:    General: Skin is warm.  Neurological:     Mental Status: She is alert and oriented to person, place, and time. Mental status is at baseline.  Psychiatric:        Mood and Affect: Mood normal.        Behavior: Behavior normal.        Thought Content: Thought content normal.          Judgment: Judgment normal.      LABORATORY DATA:  I have reviewed the labs as listed.  CBC    Component Value Date/Time   WBC 3.2 (L) 03/09/2019 0900   RBC 3.49 (L) 03/09/2019 0900   HGB 11.8 (L) 03/09/2019 0900   HCT 39.6 03/09/2019 0900   PLT 125 (L) 03/09/2019 0900   MCV 113.5 (H) 03/09/2019 0900   MCH 33.8 03/09/2019 0900   MCHC 29.8 (L) 03/09/2019 0900   RDW 13.8 03/09/2019 0900   LYMPHSABS 1.1 03/09/2019 0900   MONOABS 0.3 03/09/2019 0900   EOSABS 0.1 03/09/2019 0900   BASOSABS 0.0 03/09/2019 0900   CMP Latest Ref Rng & Units 03/14/2018 10/19/2017 07/23/2017  Glucose 70 - 99 mg/dL 73 72 75  BUN 8 - 23 mg/dL 52(H) 38(H) 30(H)  Creatinine 0.44 - 1.00 mg/dL 14.70(H) 7.19(H) 5.64(H)  Sodium 135 - 145 mmol/L 136 137 136  Potassium 3.5 - 5.1 mmol/L 4.4 3.5 3.9  Chloride 98 - 111 mmol/L 104 96(L) 95(L)  CO2 20 - 32 mmol/L - 26 30  Calcium 8.6 - 10.4 mg/dL - 8.9 8.3(L)  Total Protein 6.1 - 8.1 g/dL - 8.1 8.9(H)  Total Bilirubin 0.2 - 1.2 mg/dL - 0.5 0.8  Alkaline Phos 38 - 126 U/L - - 82  AST 10 - 35 U/L - 17 19  ALT 6 - 29 U/L - 11 12(L)    I personally performed a face-to-face visit, made revisions and my assessment and plan  is as follows.  All questions were answered to patient's stated satisfaction. Encouraged patient to call with any new concerns or questions before his next visit to the cancer center and we can certain see him sooner, if needed.     ASSESSMENT & PLAN:   Other pancytopenia ([facility]) 1.  Pancytopenia: -CBC on 09/20/2018 showed white count 2.6, hemoglobin 9.4 and MCV 100, platelet count 128.  Differential showed 44% neutrophils. -Repeat CBC in our office on 12/08/2018 showed white count 3.4 with 60% neutrophils.  Hemoglobin improved to 10.8 and platelet count was normal at 172. -B12 was normal with elevated methylmalonic acid of 700.  I have given a B12 shot today in the office.  She is taking B12 1 mg tablet daily. -We have done infectious disease  panel including CMV, EBV and parvo B19 and hepatitis panel which were negative.  EBV IgG antibody was positive but IgM was negative. -Labs done on 03/09/2019 showed hemoglobin 11.8, WBC 3.2, platelets 125, ferritin 873, percent saturation 28, vitamin B12 level 2231. -She will follow-up in 4 months with repeat labs and B12 levels.  2.  HIV: -She is on Epivir 50 milligrams daily, Retrovir 100 mg 3 times a day and Tivicay 50 mg daily. -CD4 count and viral load are done periodically by infectious disease.  3. ESRD: -She undergoes hemodialysis on Mondays, Wednesdays and Fridays.  4.  Chronic pain: -She is on fentanyl 50 mcg and oxycodone 10 mg twice daily as needed.      Orders placed this encounter:  Orders Placed This Encounter  Procedures  . Lactate dehydrogenase  . Methylmalonic acid, serum  . CBC with Differential/Platelet  . Comprehensive metabolic panel  . Ferritin  . Iron and TIBC  . Vitamin B12  . Vitamin D 25 hydroxy  . Folate     Tina Finders, FNP-C [facility] [phone]

## 2019-03-20 DIAGNOSIS — D631 Anemia in chronic kidney disease: Secondary | ICD-10-CM | POA: Diagnosis not present

## 2019-03-20 DIAGNOSIS — Z1152 Encounter for screening for COVID-19: Secondary | ICD-10-CM | POA: Diagnosis not present

## 2019-03-20 DIAGNOSIS — Z992 Dependence on renal dialysis: Secondary | ICD-10-CM | POA: Diagnosis not present

## 2019-03-20 DIAGNOSIS — Z20822 Contact with and (suspected) exposure to covid-19: Secondary | ICD-10-CM | POA: Diagnosis not present

## 2019-03-20 DIAGNOSIS — D472 Monoclonal gammopathy: Secondary | ICD-10-CM | POA: Diagnosis not present

## 2019-03-20 DIAGNOSIS — Z79899 Other long term (current) drug therapy: Secondary | ICD-10-CM | POA: Diagnosis not present

## 2019-03-20 DIAGNOSIS — D509 Iron deficiency anemia, unspecified: Secondary | ICD-10-CM | POA: Diagnosis not present

## 2019-03-20 DIAGNOSIS — N2581 Secondary hyperparathyroidism of renal origin: Secondary | ICD-10-CM | POA: Diagnosis not present

## 2019-03-20 DIAGNOSIS — N186 End stage renal disease: Secondary | ICD-10-CM | POA: Diagnosis not present

## 2019-03-24 DIAGNOSIS — D509 Iron deficiency anemia, unspecified: Secondary | ICD-10-CM | POA: Diagnosis not present

## 2019-03-24 DIAGNOSIS — D631 Anemia in chronic kidney disease: Secondary | ICD-10-CM | POA: Diagnosis not present

## 2019-03-24 DIAGNOSIS — N186 End stage renal disease: Secondary | ICD-10-CM | POA: Diagnosis not present

## 2019-03-24 DIAGNOSIS — Z992 Dependence on renal dialysis: Secondary | ICD-10-CM | POA: Diagnosis not present

## 2019-03-24 DIAGNOSIS — D472 Monoclonal gammopathy: Secondary | ICD-10-CM | POA: Diagnosis not present

## 2019-03-24 DIAGNOSIS — N2581 Secondary hyperparathyroidism of renal origin: Secondary | ICD-10-CM | POA: Diagnosis not present

## 2019-03-27 DIAGNOSIS — Z1152 Encounter for screening for COVID-19: Secondary | ICD-10-CM | POA: Diagnosis not present

## 2019-03-27 DIAGNOSIS — D631 Anemia in chronic kidney disease: Secondary | ICD-10-CM | POA: Diagnosis not present

## 2019-03-27 DIAGNOSIS — Z992 Dependence on renal dialysis: Secondary | ICD-10-CM | POA: Diagnosis not present

## 2019-03-27 DIAGNOSIS — N186 End stage renal disease: Secondary | ICD-10-CM | POA: Diagnosis not present

## 2019-03-27 DIAGNOSIS — D472 Monoclonal gammopathy: Secondary | ICD-10-CM | POA: Diagnosis not present

## 2019-03-27 DIAGNOSIS — D509 Iron deficiency anemia, unspecified: Secondary | ICD-10-CM | POA: Diagnosis not present

## 2019-03-27 DIAGNOSIS — Z20822 Contact with and (suspected) exposure to covid-19: Secondary | ICD-10-CM | POA: Diagnosis not present

## 2019-03-27 DIAGNOSIS — N2581 Secondary hyperparathyroidism of renal origin: Secondary | ICD-10-CM | POA: Diagnosis not present

## 2019-03-29 DIAGNOSIS — N2581 Secondary hyperparathyroidism of renal origin: Secondary | ICD-10-CM | POA: Diagnosis not present

## 2019-03-29 DIAGNOSIS — N186 End stage renal disease: Secondary | ICD-10-CM | POA: Diagnosis not present

## 2019-03-29 DIAGNOSIS — Z992 Dependence on renal dialysis: Secondary | ICD-10-CM | POA: Diagnosis not present

## 2019-03-29 DIAGNOSIS — D472 Monoclonal gammopathy: Secondary | ICD-10-CM | POA: Diagnosis not present

## 2019-03-29 DIAGNOSIS — D631 Anemia in chronic kidney disease: Secondary | ICD-10-CM | POA: Diagnosis not present

## 2019-03-29 DIAGNOSIS — D509 Iron deficiency anemia, unspecified: Secondary | ICD-10-CM | POA: Diagnosis not present

## 2019-03-31 DIAGNOSIS — N186 End stage renal disease: Secondary | ICD-10-CM | POA: Diagnosis not present

## 2019-03-31 DIAGNOSIS — D472 Monoclonal gammopathy: Secondary | ICD-10-CM | POA: Diagnosis not present

## 2019-03-31 DIAGNOSIS — D509 Iron deficiency anemia, unspecified: Secondary | ICD-10-CM | POA: Diagnosis not present

## 2019-03-31 DIAGNOSIS — Z992 Dependence on renal dialysis: Secondary | ICD-10-CM | POA: Diagnosis not present

## 2019-03-31 DIAGNOSIS — N2581 Secondary hyperparathyroidism of renal origin: Secondary | ICD-10-CM | POA: Diagnosis not present

## 2019-03-31 DIAGNOSIS — D631 Anemia in chronic kidney disease: Secondary | ICD-10-CM | POA: Diagnosis not present

## 2019-04-03 DIAGNOSIS — N2581 Secondary hyperparathyroidism of renal origin: Secondary | ICD-10-CM | POA: Diagnosis not present

## 2019-04-03 DIAGNOSIS — Z992 Dependence on renal dialysis: Secondary | ICD-10-CM | POA: Diagnosis not present

## 2019-04-03 DIAGNOSIS — D631 Anemia in chronic kidney disease: Secondary | ICD-10-CM | POA: Diagnosis not present

## 2019-04-03 DIAGNOSIS — Z20822 Contact with and (suspected) exposure to covid-19: Secondary | ICD-10-CM | POA: Diagnosis not present

## 2019-04-03 DIAGNOSIS — Z1152 Encounter for screening for COVID-19: Secondary | ICD-10-CM | POA: Diagnosis not present

## 2019-04-03 DIAGNOSIS — N186 End stage renal disease: Secondary | ICD-10-CM | POA: Diagnosis not present

## 2019-04-03 DIAGNOSIS — D472 Monoclonal gammopathy: Secondary | ICD-10-CM | POA: Diagnosis not present

## 2019-04-05 DIAGNOSIS — N2581 Secondary hyperparathyroidism of renal origin: Secondary | ICD-10-CM | POA: Diagnosis not present

## 2019-04-05 DIAGNOSIS — I480 Paroxysmal atrial fibrillation: Secondary | ICD-10-CM | POA: Diagnosis not present

## 2019-04-05 DIAGNOSIS — N186 End stage renal disease: Secondary | ICD-10-CM | POA: Diagnosis not present

## 2019-04-05 DIAGNOSIS — G5603 Carpal tunnel syndrome, bilateral upper limbs: Secondary | ICD-10-CM | POA: Diagnosis not present

## 2019-04-05 DIAGNOSIS — Z79899 Other long term (current) drug therapy: Secondary | ICD-10-CM | POA: Diagnosis not present

## 2019-04-05 DIAGNOSIS — Z992 Dependence on renal dialysis: Secondary | ICD-10-CM | POA: Diagnosis not present

## 2019-04-05 DIAGNOSIS — G8929 Other chronic pain: Secondary | ICD-10-CM | POA: Diagnosis not present

## 2019-04-05 DIAGNOSIS — D472 Monoclonal gammopathy: Secondary | ICD-10-CM | POA: Diagnosis not present

## 2019-04-05 DIAGNOSIS — D631 Anemia in chronic kidney disease: Secondary | ICD-10-CM | POA: Diagnosis not present

## 2019-04-05 DIAGNOSIS — B20 Human immunodeficiency virus [HIV] disease: Secondary | ICD-10-CM | POA: Diagnosis not present

## 2019-04-07 DIAGNOSIS — Z992 Dependence on renal dialysis: Secondary | ICD-10-CM | POA: Diagnosis not present

## 2019-04-07 DIAGNOSIS — N186 End stage renal disease: Secondary | ICD-10-CM | POA: Diagnosis not present

## 2019-04-07 DIAGNOSIS — N2581 Secondary hyperparathyroidism of renal origin: Secondary | ICD-10-CM | POA: Diagnosis not present

## 2019-04-07 DIAGNOSIS — D631 Anemia in chronic kidney disease: Secondary | ICD-10-CM | POA: Diagnosis not present

## 2019-04-07 DIAGNOSIS — D472 Monoclonal gammopathy: Secondary | ICD-10-CM | POA: Diagnosis not present

## 2019-04-10 DIAGNOSIS — Z20822 Contact with and (suspected) exposure to covid-19: Secondary | ICD-10-CM | POA: Diagnosis not present

## 2019-04-10 DIAGNOSIS — Z1152 Encounter for screening for COVID-19: Secondary | ICD-10-CM | POA: Diagnosis not present

## 2019-04-11 DIAGNOSIS — D472 Monoclonal gammopathy: Secondary | ICD-10-CM | POA: Diagnosis not present

## 2019-04-11 DIAGNOSIS — N2581 Secondary hyperparathyroidism of renal origin: Secondary | ICD-10-CM | POA: Diagnosis not present

## 2019-04-11 DIAGNOSIS — N186 End stage renal disease: Secondary | ICD-10-CM | POA: Diagnosis not present

## 2019-04-11 DIAGNOSIS — D509 Iron deficiency anemia, unspecified: Secondary | ICD-10-CM | POA: Diagnosis not present

## 2019-04-11 DIAGNOSIS — Z992 Dependence on renal dialysis: Secondary | ICD-10-CM | POA: Diagnosis not present

## 2019-04-11 DIAGNOSIS — D631 Anemia in chronic kidney disease: Secondary | ICD-10-CM | POA: Diagnosis not present

## 2019-04-12 DIAGNOSIS — Z23 Encounter for immunization: Secondary | ICD-10-CM | POA: Diagnosis not present

## 2019-04-13 DIAGNOSIS — D472 Monoclonal gammopathy: Secondary | ICD-10-CM | POA: Diagnosis not present

## 2019-04-13 DIAGNOSIS — N2581 Secondary hyperparathyroidism of renal origin: Secondary | ICD-10-CM | POA: Diagnosis not present

## 2019-04-13 DIAGNOSIS — N186 End stage renal disease: Secondary | ICD-10-CM | POA: Diagnosis not present

## 2019-04-13 DIAGNOSIS — D509 Iron deficiency anemia, unspecified: Secondary | ICD-10-CM | POA: Diagnosis not present

## 2019-04-13 DIAGNOSIS — D631 Anemia in chronic kidney disease: Secondary | ICD-10-CM | POA: Diagnosis not present

## 2019-04-13 DIAGNOSIS — Z992 Dependence on renal dialysis: Secondary | ICD-10-CM | POA: Diagnosis not present

## 2019-04-14 DIAGNOSIS — F5101 Primary insomnia: Secondary | ICD-10-CM | POA: Diagnosis not present

## 2019-04-14 DIAGNOSIS — F331 Major depressive disorder, recurrent, moderate: Secondary | ICD-10-CM | POA: Diagnosis not present

## 2019-04-15 DIAGNOSIS — N2581 Secondary hyperparathyroidism of renal origin: Secondary | ICD-10-CM | POA: Diagnosis not present

## 2019-04-15 DIAGNOSIS — D509 Iron deficiency anemia, unspecified: Secondary | ICD-10-CM | POA: Diagnosis not present

## 2019-04-15 DIAGNOSIS — D631 Anemia in chronic kidney disease: Secondary | ICD-10-CM | POA: Diagnosis not present

## 2019-04-15 DIAGNOSIS — N186 End stage renal disease: Secondary | ICD-10-CM | POA: Diagnosis not present

## 2019-04-15 DIAGNOSIS — D472 Monoclonal gammopathy: Secondary | ICD-10-CM | POA: Diagnosis not present

## 2019-04-15 DIAGNOSIS — Z992 Dependence on renal dialysis: Secondary | ICD-10-CM | POA: Diagnosis not present

## 2019-04-18 DIAGNOSIS — D472 Monoclonal gammopathy: Secondary | ICD-10-CM | POA: Diagnosis not present

## 2019-04-18 DIAGNOSIS — N186 End stage renal disease: Secondary | ICD-10-CM | POA: Diagnosis not present

## 2019-04-18 DIAGNOSIS — Z992 Dependence on renal dialysis: Secondary | ICD-10-CM | POA: Diagnosis not present

## 2019-04-18 DIAGNOSIS — N2581 Secondary hyperparathyroidism of renal origin: Secondary | ICD-10-CM | POA: Diagnosis not present

## 2019-04-18 DIAGNOSIS — D631 Anemia in chronic kidney disease: Secondary | ICD-10-CM | POA: Diagnosis not present

## 2019-04-18 DIAGNOSIS — D509 Iron deficiency anemia, unspecified: Secondary | ICD-10-CM | POA: Diagnosis not present

## 2019-04-22 DIAGNOSIS — Z992 Dependence on renal dialysis: Secondary | ICD-10-CM | POA: Diagnosis not present

## 2019-04-22 DIAGNOSIS — D631 Anemia in chronic kidney disease: Secondary | ICD-10-CM | POA: Diagnosis not present

## 2019-04-22 DIAGNOSIS — D509 Iron deficiency anemia, unspecified: Secondary | ICD-10-CM | POA: Diagnosis not present

## 2019-04-22 DIAGNOSIS — N2581 Secondary hyperparathyroidism of renal origin: Secondary | ICD-10-CM | POA: Diagnosis not present

## 2019-04-22 DIAGNOSIS — N186 End stage renal disease: Secondary | ICD-10-CM | POA: Diagnosis not present

## 2019-04-22 DIAGNOSIS — D472 Monoclonal gammopathy: Secondary | ICD-10-CM | POA: Diagnosis not present

## 2019-04-24 DIAGNOSIS — Z20828 Contact with and (suspected) exposure to other viral communicable diseases: Secondary | ICD-10-CM | POA: Diagnosis not present

## 2019-04-24 DIAGNOSIS — Z1152 Encounter for screening for COVID-19: Secondary | ICD-10-CM | POA: Diagnosis not present

## 2019-04-25 DIAGNOSIS — N2581 Secondary hyperparathyroidism of renal origin: Secondary | ICD-10-CM | POA: Diagnosis not present

## 2019-04-25 DIAGNOSIS — D509 Iron deficiency anemia, unspecified: Secondary | ICD-10-CM | POA: Diagnosis not present

## 2019-04-25 DIAGNOSIS — I480 Paroxysmal atrial fibrillation: Secondary | ICD-10-CM | POA: Diagnosis not present

## 2019-04-25 DIAGNOSIS — D631 Anemia in chronic kidney disease: Secondary | ICD-10-CM | POA: Diagnosis not present

## 2019-04-25 DIAGNOSIS — D472 Monoclonal gammopathy: Secondary | ICD-10-CM | POA: Diagnosis not present

## 2019-04-25 DIAGNOSIS — Z8673 Personal history of transient ischemic attack (TIA), and cerebral infarction without residual deficits: Secondary | ICD-10-CM | POA: Diagnosis not present

## 2019-04-25 DIAGNOSIS — N186 End stage renal disease: Secondary | ICD-10-CM | POA: Diagnosis not present

## 2019-04-25 DIAGNOSIS — Z992 Dependence on renal dialysis: Secondary | ICD-10-CM | POA: Diagnosis not present

## 2019-04-27 DIAGNOSIS — N186 End stage renal disease: Secondary | ICD-10-CM | POA: Diagnosis not present

## 2019-04-27 DIAGNOSIS — D631 Anemia in chronic kidney disease: Secondary | ICD-10-CM | POA: Diagnosis not present

## 2019-04-27 DIAGNOSIS — D472 Monoclonal gammopathy: Secondary | ICD-10-CM | POA: Diagnosis not present

## 2019-04-27 DIAGNOSIS — D509 Iron deficiency anemia, unspecified: Secondary | ICD-10-CM | POA: Diagnosis not present

## 2019-04-27 DIAGNOSIS — Z992 Dependence on renal dialysis: Secondary | ICD-10-CM | POA: Diagnosis not present

## 2019-04-27 DIAGNOSIS — R1311 Dysphagia, oral phase: Secondary | ICD-10-CM | POA: Diagnosis not present

## 2019-04-27 DIAGNOSIS — N2581 Secondary hyperparathyroidism of renal origin: Secondary | ICD-10-CM | POA: Diagnosis not present

## 2019-04-29 DIAGNOSIS — D472 Monoclonal gammopathy: Secondary | ICD-10-CM | POA: Diagnosis not present

## 2019-04-29 DIAGNOSIS — N2581 Secondary hyperparathyroidism of renal origin: Secondary | ICD-10-CM | POA: Diagnosis not present

## 2019-04-29 DIAGNOSIS — D509 Iron deficiency anemia, unspecified: Secondary | ICD-10-CM | POA: Diagnosis not present

## 2019-04-29 DIAGNOSIS — D631 Anemia in chronic kidney disease: Secondary | ICD-10-CM | POA: Diagnosis not present

## 2019-04-29 DIAGNOSIS — Z992 Dependence on renal dialysis: Secondary | ICD-10-CM | POA: Diagnosis not present

## 2019-04-29 DIAGNOSIS — N186 End stage renal disease: Secondary | ICD-10-CM | POA: Diagnosis not present

## 2019-04-30 DIAGNOSIS — Z992 Dependence on renal dialysis: Secondary | ICD-10-CM | POA: Diagnosis not present

## 2019-04-30 DIAGNOSIS — N186 End stage renal disease: Secondary | ICD-10-CM | POA: Diagnosis not present

## 2019-05-01 DIAGNOSIS — I517 Cardiomegaly: Secondary | ICD-10-CM | POA: Diagnosis not present

## 2019-05-01 DIAGNOSIS — I340 Nonrheumatic mitral (valve) insufficiency: Secondary | ICD-10-CM | POA: Diagnosis not present

## 2019-05-01 DIAGNOSIS — I361 Nonrheumatic tricuspid (valve) insufficiency: Secondary | ICD-10-CM | POA: Diagnosis not present

## 2019-05-02 DIAGNOSIS — R1311 Dysphagia, oral phase: Secondary | ICD-10-CM | POA: Diagnosis not present

## 2019-05-04 DIAGNOSIS — N2581 Secondary hyperparathyroidism of renal origin: Secondary | ICD-10-CM | POA: Diagnosis not present

## 2019-05-04 DIAGNOSIS — D509 Iron deficiency anemia, unspecified: Secondary | ICD-10-CM | POA: Diagnosis not present

## 2019-05-04 DIAGNOSIS — N186 End stage renal disease: Secondary | ICD-10-CM | POA: Diagnosis not present

## 2019-05-04 DIAGNOSIS — Z992 Dependence on renal dialysis: Secondary | ICD-10-CM | POA: Diagnosis not present

## 2019-05-04 DIAGNOSIS — D472 Monoclonal gammopathy: Secondary | ICD-10-CM | POA: Diagnosis not present

## 2019-05-04 DIAGNOSIS — D631 Anemia in chronic kidney disease: Secondary | ICD-10-CM | POA: Diagnosis not present

## 2019-05-04 DIAGNOSIS — Z23 Encounter for immunization: Secondary | ICD-10-CM | POA: Diagnosis not present

## 2019-05-04 DIAGNOSIS — R1311 Dysphagia, oral phase: Secondary | ICD-10-CM | POA: Diagnosis not present

## 2019-05-05 DIAGNOSIS — R1311 Dysphagia, oral phase: Secondary | ICD-10-CM | POA: Diagnosis not present

## 2019-05-06 DIAGNOSIS — D472 Monoclonal gammopathy: Secondary | ICD-10-CM | POA: Diagnosis not present

## 2019-05-06 DIAGNOSIS — N2581 Secondary hyperparathyroidism of renal origin: Secondary | ICD-10-CM | POA: Diagnosis not present

## 2019-05-06 DIAGNOSIS — Z23 Encounter for immunization: Secondary | ICD-10-CM | POA: Diagnosis not present

## 2019-05-06 DIAGNOSIS — Z992 Dependence on renal dialysis: Secondary | ICD-10-CM | POA: Diagnosis not present

## 2019-05-06 DIAGNOSIS — N186 End stage renal disease: Secondary | ICD-10-CM | POA: Diagnosis not present

## 2019-05-06 DIAGNOSIS — D509 Iron deficiency anemia, unspecified: Secondary | ICD-10-CM | POA: Diagnosis not present

## 2019-05-08 DIAGNOSIS — R1311 Dysphagia, oral phase: Secondary | ICD-10-CM | POA: Diagnosis not present

## 2019-05-09 DIAGNOSIS — D509 Iron deficiency anemia, unspecified: Secondary | ICD-10-CM | POA: Diagnosis not present

## 2019-05-09 DIAGNOSIS — N2581 Secondary hyperparathyroidism of renal origin: Secondary | ICD-10-CM | POA: Diagnosis not present

## 2019-05-09 DIAGNOSIS — D472 Monoclonal gammopathy: Secondary | ICD-10-CM | POA: Diagnosis not present

## 2019-05-09 DIAGNOSIS — N186 End stage renal disease: Secondary | ICD-10-CM | POA: Diagnosis not present

## 2019-05-09 DIAGNOSIS — R1311 Dysphagia, oral phase: Secondary | ICD-10-CM | POA: Diagnosis not present

## 2019-05-09 DIAGNOSIS — Z992 Dependence on renal dialysis: Secondary | ICD-10-CM | POA: Diagnosis not present

## 2019-05-09 DIAGNOSIS — Z23 Encounter for immunization: Secondary | ICD-10-CM | POA: Diagnosis not present

## 2019-05-10 DIAGNOSIS — R1311 Dysphagia, oral phase: Secondary | ICD-10-CM | POA: Diagnosis not present

## 2019-05-11 DIAGNOSIS — Z23 Encounter for immunization: Secondary | ICD-10-CM | POA: Diagnosis not present

## 2019-05-11 DIAGNOSIS — Z992 Dependence on renal dialysis: Secondary | ICD-10-CM | POA: Diagnosis not present

## 2019-05-11 DIAGNOSIS — N186 End stage renal disease: Secondary | ICD-10-CM | POA: Diagnosis not present

## 2019-05-11 DIAGNOSIS — D472 Monoclonal gammopathy: Secondary | ICD-10-CM | POA: Diagnosis not present

## 2019-05-11 DIAGNOSIS — N2581 Secondary hyperparathyroidism of renal origin: Secondary | ICD-10-CM | POA: Diagnosis not present

## 2019-05-11 DIAGNOSIS — R1311 Dysphagia, oral phase: Secondary | ICD-10-CM | POA: Diagnosis not present

## 2019-05-11 DIAGNOSIS — D509 Iron deficiency anemia, unspecified: Secondary | ICD-10-CM | POA: Diagnosis not present

## 2019-05-13 DIAGNOSIS — D472 Monoclonal gammopathy: Secondary | ICD-10-CM | POA: Diagnosis not present

## 2019-05-13 DIAGNOSIS — D509 Iron deficiency anemia, unspecified: Secondary | ICD-10-CM | POA: Diagnosis not present

## 2019-05-13 DIAGNOSIS — Z992 Dependence on renal dialysis: Secondary | ICD-10-CM | POA: Diagnosis not present

## 2019-05-13 DIAGNOSIS — N186 End stage renal disease: Secondary | ICD-10-CM | POA: Diagnosis not present

## 2019-05-13 DIAGNOSIS — Z23 Encounter for immunization: Secondary | ICD-10-CM | POA: Diagnosis not present

## 2019-05-13 DIAGNOSIS — N2581 Secondary hyperparathyroidism of renal origin: Secondary | ICD-10-CM | POA: Diagnosis not present

## 2019-05-15 DIAGNOSIS — R1311 Dysphagia, oral phase: Secondary | ICD-10-CM | POA: Diagnosis not present

## 2019-05-16 DIAGNOSIS — N186 End stage renal disease: Secondary | ICD-10-CM | POA: Diagnosis not present

## 2019-05-16 DIAGNOSIS — N2581 Secondary hyperparathyroidism of renal origin: Secondary | ICD-10-CM | POA: Diagnosis not present

## 2019-05-16 DIAGNOSIS — R1311 Dysphagia, oral phase: Secondary | ICD-10-CM | POA: Diagnosis not present

## 2019-05-16 DIAGNOSIS — D509 Iron deficiency anemia, unspecified: Secondary | ICD-10-CM | POA: Diagnosis not present

## 2019-05-16 DIAGNOSIS — Z23 Encounter for immunization: Secondary | ICD-10-CM | POA: Diagnosis not present

## 2019-05-16 DIAGNOSIS — Z992 Dependence on renal dialysis: Secondary | ICD-10-CM | POA: Diagnosis not present

## 2019-05-16 DIAGNOSIS — D472 Monoclonal gammopathy: Secondary | ICD-10-CM | POA: Diagnosis not present

## 2019-05-17 DIAGNOSIS — R1311 Dysphagia, oral phase: Secondary | ICD-10-CM | POA: Diagnosis not present

## 2019-05-18 DIAGNOSIS — N186 End stage renal disease: Secondary | ICD-10-CM | POA: Diagnosis not present

## 2019-05-18 DIAGNOSIS — D509 Iron deficiency anemia, unspecified: Secondary | ICD-10-CM | POA: Diagnosis not present

## 2019-05-18 DIAGNOSIS — Z23 Encounter for immunization: Secondary | ICD-10-CM | POA: Diagnosis not present

## 2019-05-18 DIAGNOSIS — Z992 Dependence on renal dialysis: Secondary | ICD-10-CM | POA: Diagnosis not present

## 2019-05-18 DIAGNOSIS — N2581 Secondary hyperparathyroidism of renal origin: Secondary | ICD-10-CM | POA: Diagnosis not present

## 2019-05-18 DIAGNOSIS — D472 Monoclonal gammopathy: Secondary | ICD-10-CM | POA: Diagnosis not present

## 2019-05-19 DIAGNOSIS — F330 Major depressive disorder, recurrent, mild: Secondary | ICD-10-CM | POA: Diagnosis not present

## 2019-05-19 DIAGNOSIS — F5101 Primary insomnia: Secondary | ICD-10-CM | POA: Diagnosis not present

## 2019-05-19 DIAGNOSIS — F331 Major depressive disorder, recurrent, moderate: Secondary | ICD-10-CM | POA: Diagnosis not present

## 2019-05-19 DIAGNOSIS — F064 Anxiety disorder due to known physiological condition: Secondary | ICD-10-CM | POA: Diagnosis not present

## 2019-05-19 DIAGNOSIS — G4701 Insomnia due to medical condition: Secondary | ICD-10-CM | POA: Diagnosis not present

## 2019-05-20 DIAGNOSIS — D472 Monoclonal gammopathy: Secondary | ICD-10-CM | POA: Diagnosis not present

## 2019-05-20 DIAGNOSIS — N186 End stage renal disease: Secondary | ICD-10-CM | POA: Diagnosis not present

## 2019-05-20 DIAGNOSIS — N2581 Secondary hyperparathyroidism of renal origin: Secondary | ICD-10-CM | POA: Diagnosis not present

## 2019-05-20 DIAGNOSIS — Z23 Encounter for immunization: Secondary | ICD-10-CM | POA: Diagnosis not present

## 2019-05-20 DIAGNOSIS — D509 Iron deficiency anemia, unspecified: Secondary | ICD-10-CM | POA: Diagnosis not present

## 2019-05-20 DIAGNOSIS — Z992 Dependence on renal dialysis: Secondary | ICD-10-CM | POA: Diagnosis not present

## 2019-05-23 DIAGNOSIS — Z23 Encounter for immunization: Secondary | ICD-10-CM | POA: Diagnosis not present

## 2019-05-23 DIAGNOSIS — D509 Iron deficiency anemia, unspecified: Secondary | ICD-10-CM | POA: Diagnosis not present

## 2019-05-23 DIAGNOSIS — N186 End stage renal disease: Secondary | ICD-10-CM | POA: Diagnosis not present

## 2019-05-23 DIAGNOSIS — N2581 Secondary hyperparathyroidism of renal origin: Secondary | ICD-10-CM | POA: Diagnosis not present

## 2019-05-23 DIAGNOSIS — Z992 Dependence on renal dialysis: Secondary | ICD-10-CM | POA: Diagnosis not present

## 2019-05-23 DIAGNOSIS — D472 Monoclonal gammopathy: Secondary | ICD-10-CM | POA: Diagnosis not present

## 2019-05-25 DIAGNOSIS — Z992 Dependence on renal dialysis: Secondary | ICD-10-CM | POA: Diagnosis not present

## 2019-05-25 DIAGNOSIS — Z23 Encounter for immunization: Secondary | ICD-10-CM | POA: Diagnosis not present

## 2019-05-25 DIAGNOSIS — D509 Iron deficiency anemia, unspecified: Secondary | ICD-10-CM | POA: Diagnosis not present

## 2019-05-25 DIAGNOSIS — D472 Monoclonal gammopathy: Secondary | ICD-10-CM | POA: Diagnosis not present

## 2019-05-25 DIAGNOSIS — N2581 Secondary hyperparathyroidism of renal origin: Secondary | ICD-10-CM | POA: Diagnosis not present

## 2019-05-25 DIAGNOSIS — N186 End stage renal disease: Secondary | ICD-10-CM | POA: Diagnosis not present

## 2019-05-27 DIAGNOSIS — E785 Hyperlipidemia, unspecified: Secondary | ICD-10-CM | POA: Diagnosis not present

## 2019-05-27 DIAGNOSIS — D472 Monoclonal gammopathy: Secondary | ICD-10-CM | POA: Diagnosis not present

## 2019-05-27 DIAGNOSIS — Z992 Dependence on renal dialysis: Secondary | ICD-10-CM | POA: Diagnosis not present

## 2019-05-27 DIAGNOSIS — D509 Iron deficiency anemia, unspecified: Secondary | ICD-10-CM | POA: Diagnosis not present

## 2019-05-27 DIAGNOSIS — Z23 Encounter for immunization: Secondary | ICD-10-CM | POA: Diagnosis not present

## 2019-05-27 DIAGNOSIS — N2581 Secondary hyperparathyroidism of renal origin: Secondary | ICD-10-CM | POA: Diagnosis not present

## 2019-05-27 DIAGNOSIS — N186 End stage renal disease: Secondary | ICD-10-CM | POA: Diagnosis not present

## 2019-05-30 DIAGNOSIS — D472 Monoclonal gammopathy: Secondary | ICD-10-CM | POA: Diagnosis not present

## 2019-05-30 DIAGNOSIS — Z992 Dependence on renal dialysis: Secondary | ICD-10-CM | POA: Diagnosis not present

## 2019-05-30 DIAGNOSIS — N2581 Secondary hyperparathyroidism of renal origin: Secondary | ICD-10-CM | POA: Diagnosis not present

## 2019-05-30 DIAGNOSIS — D509 Iron deficiency anemia, unspecified: Secondary | ICD-10-CM | POA: Diagnosis not present

## 2019-05-30 DIAGNOSIS — N186 End stage renal disease: Secondary | ICD-10-CM | POA: Diagnosis not present

## 2019-05-30 DIAGNOSIS — Z23 Encounter for immunization: Secondary | ICD-10-CM | POA: Diagnosis not present

## 2019-05-31 DIAGNOSIS — N186 End stage renal disease: Secondary | ICD-10-CM | POA: Diagnosis not present

## 2019-05-31 DIAGNOSIS — Z992 Dependence on renal dialysis: Secondary | ICD-10-CM | POA: Diagnosis not present

## 2019-06-01 DIAGNOSIS — D472 Monoclonal gammopathy: Secondary | ICD-10-CM | POA: Diagnosis not present

## 2019-06-01 DIAGNOSIS — N2581 Secondary hyperparathyroidism of renal origin: Secondary | ICD-10-CM | POA: Diagnosis not present

## 2019-06-01 DIAGNOSIS — Z992 Dependence on renal dialysis: Secondary | ICD-10-CM | POA: Diagnosis not present

## 2019-06-01 DIAGNOSIS — N186 End stage renal disease: Secondary | ICD-10-CM | POA: Diagnosis not present

## 2019-06-01 DIAGNOSIS — D509 Iron deficiency anemia, unspecified: Secondary | ICD-10-CM | POA: Diagnosis not present

## 2019-06-01 DIAGNOSIS — D631 Anemia in chronic kidney disease: Secondary | ICD-10-CM | POA: Diagnosis not present

## 2019-06-03 DIAGNOSIS — Z992 Dependence on renal dialysis: Secondary | ICD-10-CM | POA: Diagnosis not present

## 2019-06-03 DIAGNOSIS — D472 Monoclonal gammopathy: Secondary | ICD-10-CM | POA: Diagnosis not present

## 2019-06-03 DIAGNOSIS — D509 Iron deficiency anemia, unspecified: Secondary | ICD-10-CM | POA: Diagnosis not present

## 2019-06-03 DIAGNOSIS — N2581 Secondary hyperparathyroidism of renal origin: Secondary | ICD-10-CM | POA: Diagnosis not present

## 2019-06-03 DIAGNOSIS — D631 Anemia in chronic kidney disease: Secondary | ICD-10-CM | POA: Diagnosis not present

## 2019-06-03 DIAGNOSIS — N186 End stage renal disease: Secondary | ICD-10-CM | POA: Diagnosis not present

## 2019-06-06 DIAGNOSIS — Z992 Dependence on renal dialysis: Secondary | ICD-10-CM | POA: Diagnosis not present

## 2019-06-06 DIAGNOSIS — D509 Iron deficiency anemia, unspecified: Secondary | ICD-10-CM | POA: Diagnosis not present

## 2019-06-06 DIAGNOSIS — N2581 Secondary hyperparathyroidism of renal origin: Secondary | ICD-10-CM | POA: Diagnosis not present

## 2019-06-06 DIAGNOSIS — N186 End stage renal disease: Secondary | ICD-10-CM | POA: Diagnosis not present

## 2019-06-06 DIAGNOSIS — D631 Anemia in chronic kidney disease: Secondary | ICD-10-CM | POA: Diagnosis not present

## 2019-06-06 DIAGNOSIS — D472 Monoclonal gammopathy: Secondary | ICD-10-CM | POA: Diagnosis not present

## 2019-06-08 ENCOUNTER — Ambulatory Visit: Payer: Medicare Other | Admitting: Vascular Surgery

## 2019-06-08 DIAGNOSIS — D631 Anemia in chronic kidney disease: Secondary | ICD-10-CM | POA: Diagnosis not present

## 2019-06-08 DIAGNOSIS — Z992 Dependence on renal dialysis: Secondary | ICD-10-CM | POA: Diagnosis not present

## 2019-06-08 DIAGNOSIS — N186 End stage renal disease: Secondary | ICD-10-CM | POA: Diagnosis not present

## 2019-06-08 DIAGNOSIS — D472 Monoclonal gammopathy: Secondary | ICD-10-CM | POA: Diagnosis not present

## 2019-06-08 DIAGNOSIS — N2581 Secondary hyperparathyroidism of renal origin: Secondary | ICD-10-CM | POA: Diagnosis not present

## 2019-06-08 DIAGNOSIS — D509 Iron deficiency anemia, unspecified: Secondary | ICD-10-CM | POA: Diagnosis not present

## 2019-06-13 DIAGNOSIS — N2581 Secondary hyperparathyroidism of renal origin: Secondary | ICD-10-CM | POA: Diagnosis not present

## 2019-06-13 DIAGNOSIS — N186 End stage renal disease: Secondary | ICD-10-CM | POA: Diagnosis not present

## 2019-06-13 DIAGNOSIS — Z992 Dependence on renal dialysis: Secondary | ICD-10-CM | POA: Diagnosis not present

## 2019-06-13 DIAGNOSIS — D509 Iron deficiency anemia, unspecified: Secondary | ICD-10-CM | POA: Diagnosis not present

## 2019-06-13 DIAGNOSIS — D472 Monoclonal gammopathy: Secondary | ICD-10-CM | POA: Diagnosis not present

## 2019-06-13 DIAGNOSIS — D631 Anemia in chronic kidney disease: Secondary | ICD-10-CM | POA: Diagnosis not present

## 2019-06-14 DIAGNOSIS — F331 Major depressive disorder, recurrent, moderate: Secondary | ICD-10-CM | POA: Diagnosis not present

## 2019-06-14 DIAGNOSIS — F064 Anxiety disorder due to known physiological condition: Secondary | ICD-10-CM | POA: Diagnosis not present

## 2019-06-14 DIAGNOSIS — F330 Major depressive disorder, recurrent, mild: Secondary | ICD-10-CM | POA: Diagnosis not present

## 2019-06-14 DIAGNOSIS — F5101 Primary insomnia: Secondary | ICD-10-CM | POA: Diagnosis not present

## 2019-06-14 DIAGNOSIS — G4701 Insomnia due to medical condition: Secondary | ICD-10-CM | POA: Diagnosis not present

## 2019-06-15 DIAGNOSIS — D472 Monoclonal gammopathy: Secondary | ICD-10-CM | POA: Diagnosis not present

## 2019-06-15 DIAGNOSIS — N186 End stage renal disease: Secondary | ICD-10-CM | POA: Diagnosis not present

## 2019-06-15 DIAGNOSIS — Z992 Dependence on renal dialysis: Secondary | ICD-10-CM | POA: Diagnosis not present

## 2019-06-15 DIAGNOSIS — D631 Anemia in chronic kidney disease: Secondary | ICD-10-CM | POA: Diagnosis not present

## 2019-06-15 DIAGNOSIS — D509 Iron deficiency anemia, unspecified: Secondary | ICD-10-CM | POA: Diagnosis not present

## 2019-06-15 DIAGNOSIS — N2581 Secondary hyperparathyroidism of renal origin: Secondary | ICD-10-CM | POA: Diagnosis not present

## 2019-06-17 DIAGNOSIS — D472 Monoclonal gammopathy: Secondary | ICD-10-CM | POA: Diagnosis not present

## 2019-06-17 DIAGNOSIS — D509 Iron deficiency anemia, unspecified: Secondary | ICD-10-CM | POA: Diagnosis not present

## 2019-06-17 DIAGNOSIS — D631 Anemia in chronic kidney disease: Secondary | ICD-10-CM | POA: Diagnosis not present

## 2019-06-17 DIAGNOSIS — N2581 Secondary hyperparathyroidism of renal origin: Secondary | ICD-10-CM | POA: Diagnosis not present

## 2019-06-17 DIAGNOSIS — Z992 Dependence on renal dialysis: Secondary | ICD-10-CM | POA: Diagnosis not present

## 2019-06-17 DIAGNOSIS — N186 End stage renal disease: Secondary | ICD-10-CM | POA: Diagnosis not present

## 2019-06-20 DIAGNOSIS — N2581 Secondary hyperparathyroidism of renal origin: Secondary | ICD-10-CM | POA: Diagnosis not present

## 2019-06-20 DIAGNOSIS — D472 Monoclonal gammopathy: Secondary | ICD-10-CM | POA: Diagnosis not present

## 2019-06-20 DIAGNOSIS — D631 Anemia in chronic kidney disease: Secondary | ICD-10-CM | POA: Diagnosis not present

## 2019-06-20 DIAGNOSIS — D509 Iron deficiency anemia, unspecified: Secondary | ICD-10-CM | POA: Diagnosis not present

## 2019-06-20 DIAGNOSIS — Z992 Dependence on renal dialysis: Secondary | ICD-10-CM | POA: Diagnosis not present

## 2019-06-20 DIAGNOSIS — N186 End stage renal disease: Secondary | ICD-10-CM | POA: Diagnosis not present

## 2019-06-22 DIAGNOSIS — D631 Anemia in chronic kidney disease: Secondary | ICD-10-CM | POA: Diagnosis not present

## 2019-06-22 DIAGNOSIS — N2581 Secondary hyperparathyroidism of renal origin: Secondary | ICD-10-CM | POA: Diagnosis not present

## 2019-06-22 DIAGNOSIS — D509 Iron deficiency anemia, unspecified: Secondary | ICD-10-CM | POA: Diagnosis not present

## 2019-06-22 DIAGNOSIS — D472 Monoclonal gammopathy: Secondary | ICD-10-CM | POA: Diagnosis not present

## 2019-06-22 DIAGNOSIS — Z992 Dependence on renal dialysis: Secondary | ICD-10-CM | POA: Diagnosis not present

## 2019-06-22 DIAGNOSIS — N186 End stage renal disease: Secondary | ICD-10-CM | POA: Diagnosis not present

## 2019-06-24 DIAGNOSIS — D631 Anemia in chronic kidney disease: Secondary | ICD-10-CM | POA: Diagnosis not present

## 2019-06-24 DIAGNOSIS — N2581 Secondary hyperparathyroidism of renal origin: Secondary | ICD-10-CM | POA: Diagnosis not present

## 2019-06-24 DIAGNOSIS — Z992 Dependence on renal dialysis: Secondary | ICD-10-CM | POA: Diagnosis not present

## 2019-06-24 DIAGNOSIS — D509 Iron deficiency anemia, unspecified: Secondary | ICD-10-CM | POA: Diagnosis not present

## 2019-06-24 DIAGNOSIS — D472 Monoclonal gammopathy: Secondary | ICD-10-CM | POA: Diagnosis not present

## 2019-06-24 DIAGNOSIS — N186 End stage renal disease: Secondary | ICD-10-CM | POA: Diagnosis not present

## 2019-06-26 DIAGNOSIS — R262 Difficulty in walking, not elsewhere classified: Secondary | ICD-10-CM | POA: Diagnosis not present

## 2019-06-26 DIAGNOSIS — M6281 Muscle weakness (generalized): Secondary | ICD-10-CM | POA: Diagnosis not present

## 2019-06-26 DIAGNOSIS — N186 End stage renal disease: Secondary | ICD-10-CM | POA: Diagnosis not present

## 2019-06-27 DIAGNOSIS — D631 Anemia in chronic kidney disease: Secondary | ICD-10-CM | POA: Diagnosis not present

## 2019-06-27 DIAGNOSIS — D509 Iron deficiency anemia, unspecified: Secondary | ICD-10-CM | POA: Diagnosis not present

## 2019-06-27 DIAGNOSIS — D472 Monoclonal gammopathy: Secondary | ICD-10-CM | POA: Diagnosis not present

## 2019-06-27 DIAGNOSIS — R262 Difficulty in walking, not elsewhere classified: Secondary | ICD-10-CM | POA: Diagnosis not present

## 2019-06-27 DIAGNOSIS — N186 End stage renal disease: Secondary | ICD-10-CM | POA: Diagnosis not present

## 2019-06-27 DIAGNOSIS — Z992 Dependence on renal dialysis: Secondary | ICD-10-CM | POA: Diagnosis not present

## 2019-06-27 DIAGNOSIS — M6281 Muscle weakness (generalized): Secondary | ICD-10-CM | POA: Diagnosis not present

## 2019-06-27 DIAGNOSIS — N2581 Secondary hyperparathyroidism of renal origin: Secondary | ICD-10-CM | POA: Diagnosis not present

## 2019-06-29 DIAGNOSIS — D631 Anemia in chronic kidney disease: Secondary | ICD-10-CM | POA: Diagnosis not present

## 2019-06-29 DIAGNOSIS — N186 End stage renal disease: Secondary | ICD-10-CM | POA: Diagnosis not present

## 2019-06-29 DIAGNOSIS — D509 Iron deficiency anemia, unspecified: Secondary | ICD-10-CM | POA: Diagnosis not present

## 2019-06-29 DIAGNOSIS — I480 Paroxysmal atrial fibrillation: Secondary | ICD-10-CM | POA: Diagnosis not present

## 2019-06-29 DIAGNOSIS — I10 Essential (primary) hypertension: Secondary | ICD-10-CM | POA: Diagnosis not present

## 2019-06-29 DIAGNOSIS — G4700 Insomnia, unspecified: Secondary | ICD-10-CM | POA: Diagnosis not present

## 2019-06-29 DIAGNOSIS — N2581 Secondary hyperparathyroidism of renal origin: Secondary | ICD-10-CM | POA: Diagnosis not present

## 2019-06-29 DIAGNOSIS — Z992 Dependence on renal dialysis: Secondary | ICD-10-CM | POA: Diagnosis not present

## 2019-06-29 DIAGNOSIS — M6281 Muscle weakness (generalized): Secondary | ICD-10-CM | POA: Diagnosis not present

## 2019-06-29 DIAGNOSIS — G8929 Other chronic pain: Secondary | ICD-10-CM | POA: Diagnosis not present

## 2019-06-29 DIAGNOSIS — B20 Human immunodeficiency virus [HIV] disease: Secondary | ICD-10-CM | POA: Diagnosis not present

## 2019-06-29 DIAGNOSIS — D472 Monoclonal gammopathy: Secondary | ICD-10-CM | POA: Diagnosis not present

## 2019-06-29 DIAGNOSIS — R262 Difficulty in walking, not elsewhere classified: Secondary | ICD-10-CM | POA: Diagnosis not present

## 2019-06-30 DIAGNOSIS — Z992 Dependence on renal dialysis: Secondary | ICD-10-CM | POA: Diagnosis not present

## 2019-06-30 DIAGNOSIS — R262 Difficulty in walking, not elsewhere classified: Secondary | ICD-10-CM | POA: Diagnosis not present

## 2019-06-30 DIAGNOSIS — M6281 Muscle weakness (generalized): Secondary | ICD-10-CM | POA: Diagnosis not present

## 2019-06-30 DIAGNOSIS — N186 End stage renal disease: Secondary | ICD-10-CM | POA: Diagnosis not present

## 2019-07-01 DIAGNOSIS — D472 Monoclonal gammopathy: Secondary | ICD-10-CM | POA: Diagnosis not present

## 2019-07-01 DIAGNOSIS — N186 End stage renal disease: Secondary | ICD-10-CM | POA: Diagnosis not present

## 2019-07-01 DIAGNOSIS — Z992 Dependence on renal dialysis: Secondary | ICD-10-CM | POA: Diagnosis not present

## 2019-07-01 DIAGNOSIS — M6281 Muscle weakness (generalized): Secondary | ICD-10-CM | POA: Diagnosis not present

## 2019-07-01 DIAGNOSIS — D631 Anemia in chronic kidney disease: Secondary | ICD-10-CM | POA: Diagnosis not present

## 2019-07-01 DIAGNOSIS — N2581 Secondary hyperparathyroidism of renal origin: Secondary | ICD-10-CM | POA: Diagnosis not present

## 2019-07-01 DIAGNOSIS — D509 Iron deficiency anemia, unspecified: Secondary | ICD-10-CM | POA: Diagnosis not present

## 2019-07-01 DIAGNOSIS — R262 Difficulty in walking, not elsewhere classified: Secondary | ICD-10-CM | POA: Diagnosis not present

## 2019-07-04 DIAGNOSIS — R262 Difficulty in walking, not elsewhere classified: Secondary | ICD-10-CM | POA: Diagnosis not present

## 2019-07-04 DIAGNOSIS — N2581 Secondary hyperparathyroidism of renal origin: Secondary | ICD-10-CM | POA: Diagnosis not present

## 2019-07-04 DIAGNOSIS — M6281 Muscle weakness (generalized): Secondary | ICD-10-CM | POA: Diagnosis not present

## 2019-07-04 DIAGNOSIS — D472 Monoclonal gammopathy: Secondary | ICD-10-CM | POA: Diagnosis not present

## 2019-07-04 DIAGNOSIS — Z992 Dependence on renal dialysis: Secondary | ICD-10-CM | POA: Diagnosis not present

## 2019-07-04 DIAGNOSIS — D631 Anemia in chronic kidney disease: Secondary | ICD-10-CM | POA: Diagnosis not present

## 2019-07-04 DIAGNOSIS — N186 End stage renal disease: Secondary | ICD-10-CM | POA: Diagnosis not present

## 2019-07-04 DIAGNOSIS — D509 Iron deficiency anemia, unspecified: Secondary | ICD-10-CM | POA: Diagnosis not present

## 2019-07-05 DIAGNOSIS — M6281 Muscle weakness (generalized): Secondary | ICD-10-CM | POA: Diagnosis not present

## 2019-07-05 DIAGNOSIS — R262 Difficulty in walking, not elsewhere classified: Secondary | ICD-10-CM | POA: Diagnosis not present

## 2019-07-05 DIAGNOSIS — N186 End stage renal disease: Secondary | ICD-10-CM | POA: Diagnosis not present

## 2019-07-06 ENCOUNTER — Ambulatory Visit (INDEPENDENT_AMBULATORY_CARE_PROVIDER_SITE_OTHER): Payer: Medicare Other | Admitting: Vascular Surgery

## 2019-07-06 ENCOUNTER — Ambulatory Visit: Payer: Medicare Other | Admitting: Vascular Surgery

## 2019-07-06 ENCOUNTER — Encounter: Payer: Self-pay | Admitting: Vascular Surgery

## 2019-07-06 ENCOUNTER — Other Ambulatory Visit: Payer: Self-pay

## 2019-07-06 VITALS — BP 141/82 | HR 54 | Temp 97.6°F | Resp 20 | Ht 62.0 in | Wt 98.3 lb

## 2019-07-06 DIAGNOSIS — D472 Monoclonal gammopathy: Secondary | ICD-10-CM | POA: Diagnosis not present

## 2019-07-06 DIAGNOSIS — D509 Iron deficiency anemia, unspecified: Secondary | ICD-10-CM | POA: Diagnosis not present

## 2019-07-06 DIAGNOSIS — N2581 Secondary hyperparathyroidism of renal origin: Secondary | ICD-10-CM | POA: Diagnosis not present

## 2019-07-06 DIAGNOSIS — Z992 Dependence on renal dialysis: Secondary | ICD-10-CM

## 2019-07-06 DIAGNOSIS — D631 Anemia in chronic kidney disease: Secondary | ICD-10-CM | POA: Diagnosis not present

## 2019-07-06 DIAGNOSIS — N186 End stage renal disease: Secondary | ICD-10-CM | POA: Diagnosis not present

## 2019-07-06 NOTE — Progress Notes (Signed)
Patient is a 69 year old female who returns for follow-up today.  She was seen by her nurse practitioner in January 2021 with complaints of burning numbness and pain in her right hand.  She has a right forearm AV fistula.  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  She states today that she still has the pain in her right hand as well as burning and numbness.  She states her fistula is working well.  She has no swelling in her right arm.  Unfortunately she was supposed to see a hand surgeon prior to her follow-up appointment today and this was never scheduled for some reason.  Review of systems: She has no shortness of breath.  She has no chest pain.  She resides in a skilled nursing facility.   Physical exam:  Vitals:   07/06/19 0903  BP: (!) 141/82  Pulse: (!) 54  Resp: 20  Temp: 97.6 F (36.4 C)  SpO2: 96%  Weight: 98 lb 4.8 oz (44.6 kg)  Height: 5\' 2"  (1.575 m)   Right forearm AV fistula palpable thrill some aneurysmal degeneration  Right hand no significant muscle atrophy  Assessment: Patient with right hand numbness tingling burning aching symptoms suggestive of possible steal versus carpal tunnel related phenomenon.  Plan: Patient will be set up for an evaluation by hand surgery again to see if she has a component of carpal tunnel syndrome in her right hand causing her symptoms..  She will follow-up with me after her hand surgery evaluation.  Ruta Hinds, MD [facility] Office: [phone]

## 2019-07-07 DIAGNOSIS — N186 End stage renal disease: Secondary | ICD-10-CM | POA: Diagnosis not present

## 2019-07-07 DIAGNOSIS — R262 Difficulty in walking, not elsewhere classified: Secondary | ICD-10-CM | POA: Diagnosis not present

## 2019-07-07 DIAGNOSIS — M6281 Muscle weakness (generalized): Secondary | ICD-10-CM | POA: Diagnosis not present

## 2019-07-08 DIAGNOSIS — D472 Monoclonal gammopathy: Secondary | ICD-10-CM | POA: Diagnosis not present

## 2019-07-08 DIAGNOSIS — N2581 Secondary hyperparathyroidism of renal origin: Secondary | ICD-10-CM | POA: Diagnosis not present

## 2019-07-08 DIAGNOSIS — N186 End stage renal disease: Secondary | ICD-10-CM | POA: Diagnosis not present

## 2019-07-08 DIAGNOSIS — R262 Difficulty in walking, not elsewhere classified: Secondary | ICD-10-CM | POA: Diagnosis not present

## 2019-07-08 DIAGNOSIS — Z992 Dependence on renal dialysis: Secondary | ICD-10-CM | POA: Diagnosis not present

## 2019-07-08 DIAGNOSIS — M6281 Muscle weakness (generalized): Secondary | ICD-10-CM | POA: Diagnosis not present

## 2019-07-08 DIAGNOSIS — D631 Anemia in chronic kidney disease: Secondary | ICD-10-CM | POA: Diagnosis not present

## 2019-07-08 DIAGNOSIS — D509 Iron deficiency anemia, unspecified: Secondary | ICD-10-CM | POA: Diagnosis not present

## 2019-07-10 DIAGNOSIS — R262 Difficulty in walking, not elsewhere classified: Secondary | ICD-10-CM | POA: Diagnosis not present

## 2019-07-10 DIAGNOSIS — N186 End stage renal disease: Secondary | ICD-10-CM | POA: Diagnosis not present

## 2019-07-10 DIAGNOSIS — M6281 Muscle weakness (generalized): Secondary | ICD-10-CM | POA: Diagnosis not present

## 2019-07-11 DIAGNOSIS — D509 Iron deficiency anemia, unspecified: Secondary | ICD-10-CM | POA: Diagnosis not present

## 2019-07-11 DIAGNOSIS — N2581 Secondary hyperparathyroidism of renal origin: Secondary | ICD-10-CM | POA: Diagnosis not present

## 2019-07-11 DIAGNOSIS — N186 End stage renal disease: Secondary | ICD-10-CM | POA: Diagnosis not present

## 2019-07-11 DIAGNOSIS — Z992 Dependence on renal dialysis: Secondary | ICD-10-CM | POA: Diagnosis not present

## 2019-07-11 DIAGNOSIS — R262 Difficulty in walking, not elsewhere classified: Secondary | ICD-10-CM | POA: Diagnosis not present

## 2019-07-11 DIAGNOSIS — M6281 Muscle weakness (generalized): Secondary | ICD-10-CM | POA: Diagnosis not present

## 2019-07-11 DIAGNOSIS — D631 Anemia in chronic kidney disease: Secondary | ICD-10-CM | POA: Diagnosis not present

## 2019-07-11 DIAGNOSIS — D472 Monoclonal gammopathy: Secondary | ICD-10-CM | POA: Diagnosis not present

## 2019-07-12 ENCOUNTER — Other Ambulatory Visit: Payer: Self-pay

## 2019-07-12 ENCOUNTER — Inpatient Hospital Stay (HOSPITAL_COMMUNITY): Payer: Medicare Other | Attending: Hematology

## 2019-07-12 DIAGNOSIS — Z862 Personal history of diseases of the blood and blood-forming organs and certain disorders involving the immune mechanism: Secondary | ICD-10-CM | POA: Insufficient documentation

## 2019-07-12 DIAGNOSIS — D61818 Other pancytopenia: Secondary | ICD-10-CM | POA: Diagnosis not present

## 2019-07-12 LAB — COMPREHENSIVE METABOLIC PANEL
ALT: 12 U/L (ref 0–44)
AST: 21 U/L (ref 15–41)
Albumin: 3.7 g/dL (ref 3.5–5.0)
Alkaline Phosphatase: 121 U/L (ref 38–126)
Anion gap: 11 (ref 5–15)
BUN: 20 mg/dL (ref 8–23)
CO2: 30 mmol/L (ref 22–32)
Calcium: 9.9 mg/dL (ref 8.9–10.3)
Chloride: 95 mmol/L — ABNORMAL LOW (ref 98–111)
Creatinine, Ser: 5.81 mg/dL — ABNORMAL HIGH (ref 0.44–1.00)
GFR calc Af Amer: 8 mL/min — ABNORMAL LOW (ref >60)
GFR calc non Af Amer: 7 mL/min — ABNORMAL LOW (ref >60)
Glucose, Bld: 88 mg/dL (ref 70–99)
Potassium: 3.6 mmol/L (ref 3.5–5.1)
Sodium: 136 mmol/L (ref 135–145)
Total Bilirubin: 0.7 mg/dL (ref 0.3–1.2)
Total Protein: 8.3 g/dL — ABNORMAL HIGH (ref 6.5–8.1)

## 2019-07-12 LAB — IRON AND TIBC
Iron: 52 ug/dL (ref 28–170)
Saturation Ratios: 33 % — ABNORMAL HIGH (ref 10.4–31.8)
TIBC: 157 ug/dL — ABNORMAL LOW (ref 250–450)
UIBC: 105 ug/dL

## 2019-07-12 LAB — CBC WITH DIFFERENTIAL/PLATELET
Abs Immature Granulocytes: 0.01 10*3/uL (ref 0.00–0.07)
Basophils Absolute: 0 10*3/uL (ref 0.0–0.1)
Basophils Relative: 0 %
Eosinophils Absolute: 0.1 10*3/uL (ref 0.0–0.5)
Eosinophils Relative: 3 %
HCT: 32.3 % — ABNORMAL LOW (ref 36.0–46.0)
Hemoglobin: 9.5 g/dL — ABNORMAL LOW (ref 12.0–15.0)
Immature Granulocytes: 0 %
Lymphocytes Relative: 38 %
Lymphs Abs: 1.1 10*3/uL (ref 0.7–4.0)
MCH: 33.8 pg (ref 26.0–34.0)
MCHC: 29.4 g/dL — ABNORMAL LOW (ref 30.0–36.0)
MCV: 114.9 fL — ABNORMAL HIGH (ref 80.0–100.0)
Monocytes Absolute: 0.3 10*3/uL (ref 0.1–1.0)
Monocytes Relative: 11 %
Neutro Abs: 1.4 10*3/uL — ABNORMAL LOW (ref 1.7–7.7)
Neutrophils Relative %: 48 %
Platelets: 114 10*3/uL — ABNORMAL LOW (ref 150–400)
RBC: 2.81 MIL/uL — ABNORMAL LOW (ref 3.87–5.11)
RDW: 14.3 % (ref 11.5–15.5)
WBC: 3 10*3/uL — ABNORMAL LOW (ref 4.0–10.5)
nRBC: 0 % (ref 0.0–0.2)

## 2019-07-12 LAB — FOLATE: Folate: 66.3 ng/mL (ref >5.9)

## 2019-07-12 LAB — LACTATE DEHYDROGENASE: LDH: 108 U/L (ref 98–192)

## 2019-07-12 LAB — FERRITIN: Ferritin: 1004 ng/mL — ABNORMAL HIGH (ref 11–307)

## 2019-07-12 LAB — VITAMIN D 25 HYDROXY (VIT D DEFICIENCY, FRACTURES): Vit D, 25-Hydroxy: 9.23 ng/mL — ABNORMAL LOW (ref 30–100)

## 2019-07-12 LAB — VITAMIN B12: Vitamin B-12: 3770 pg/mL — ABNORMAL HIGH (ref 180–914)

## 2019-07-13 ENCOUNTER — Other Ambulatory Visit (HOSPITAL_COMMUNITY): Payer: Medicare Other

## 2019-07-13 DIAGNOSIS — D631 Anemia in chronic kidney disease: Secondary | ICD-10-CM | POA: Diagnosis not present

## 2019-07-13 DIAGNOSIS — N2581 Secondary hyperparathyroidism of renal origin: Secondary | ICD-10-CM | POA: Diagnosis not present

## 2019-07-13 DIAGNOSIS — R262 Difficulty in walking, not elsewhere classified: Secondary | ICD-10-CM | POA: Diagnosis not present

## 2019-07-13 DIAGNOSIS — D509 Iron deficiency anemia, unspecified: Secondary | ICD-10-CM | POA: Diagnosis not present

## 2019-07-13 DIAGNOSIS — Z992 Dependence on renal dialysis: Secondary | ICD-10-CM | POA: Diagnosis not present

## 2019-07-13 DIAGNOSIS — D472 Monoclonal gammopathy: Secondary | ICD-10-CM | POA: Diagnosis not present

## 2019-07-13 DIAGNOSIS — M6281 Muscle weakness (generalized): Secondary | ICD-10-CM | POA: Diagnosis not present

## 2019-07-13 DIAGNOSIS — N186 End stage renal disease: Secondary | ICD-10-CM | POA: Diagnosis not present

## 2019-07-14 DIAGNOSIS — N186 End stage renal disease: Secondary | ICD-10-CM | POA: Diagnosis not present

## 2019-07-14 DIAGNOSIS — R262 Difficulty in walking, not elsewhere classified: Secondary | ICD-10-CM | POA: Diagnosis not present

## 2019-07-14 DIAGNOSIS — M6281 Muscle weakness (generalized): Secondary | ICD-10-CM | POA: Diagnosis not present

## 2019-07-15 DIAGNOSIS — Z992 Dependence on renal dialysis: Secondary | ICD-10-CM | POA: Diagnosis not present

## 2019-07-15 DIAGNOSIS — N2581 Secondary hyperparathyroidism of renal origin: Secondary | ICD-10-CM | POA: Diagnosis not present

## 2019-07-15 DIAGNOSIS — D509 Iron deficiency anemia, unspecified: Secondary | ICD-10-CM | POA: Diagnosis not present

## 2019-07-15 DIAGNOSIS — D631 Anemia in chronic kidney disease: Secondary | ICD-10-CM | POA: Diagnosis not present

## 2019-07-15 DIAGNOSIS — D472 Monoclonal gammopathy: Secondary | ICD-10-CM | POA: Diagnosis not present

## 2019-07-15 DIAGNOSIS — N186 End stage renal disease: Secondary | ICD-10-CM | POA: Diagnosis not present

## 2019-07-18 DIAGNOSIS — R262 Difficulty in walking, not elsewhere classified: Secondary | ICD-10-CM | POA: Diagnosis not present

## 2019-07-18 DIAGNOSIS — N186 End stage renal disease: Secondary | ICD-10-CM | POA: Diagnosis not present

## 2019-07-18 DIAGNOSIS — M6281 Muscle weakness (generalized): Secondary | ICD-10-CM | POA: Diagnosis not present

## 2019-07-18 LAB — METHYLMALONIC ACID, SERUM: Methylmalonic Acid, Quantitative: 454 nmol/L — ABNORMAL HIGH (ref 0–378)

## 2019-07-19 ENCOUNTER — Inpatient Hospital Stay (HOSPITAL_BASED_OUTPATIENT_CLINIC_OR_DEPARTMENT_OTHER): Payer: Medicare Other | Admitting: Nurse Practitioner

## 2019-07-19 ENCOUNTER — Inpatient Hospital Stay (HOSPITAL_COMMUNITY)
Admission: EM | Admit: 2019-07-19 | Discharge: 2019-07-22 | DRG: 371 | Disposition: A | Discharge status: Skilled Nursing Facility | Payer: Medicare Other | Source: Ambulatory Visit | Attending: Internal Medicine | Admitting: Internal Medicine

## 2019-07-19 ENCOUNTER — Inpatient Hospital Stay (HOSPITAL_COMMUNITY): Payer: Medicare Other

## 2019-07-19 ENCOUNTER — Other Ambulatory Visit: Payer: Self-pay

## 2019-07-19 ENCOUNTER — Emergency Department (HOSPITAL_COMMUNITY): Payer: Medicare Other

## 2019-07-19 ENCOUNTER — Encounter (HOSPITAL_COMMUNITY): Payer: Self-pay | Admitting: Emergency Medicine

## 2019-07-19 DIAGNOSIS — I4891 Unspecified atrial fibrillation: Secondary | ICD-10-CM | POA: Diagnosis present

## 2019-07-19 DIAGNOSIS — J811 Chronic pulmonary edema: Secondary | ICD-10-CM | POA: Diagnosis not present

## 2019-07-19 DIAGNOSIS — K439 Ventral hernia without obstruction or gangrene: Secondary | ICD-10-CM | POA: Diagnosis present

## 2019-07-19 DIAGNOSIS — Z992 Dependence on renal dialysis: Secondary | ICD-10-CM | POA: Diagnosis not present

## 2019-07-19 DIAGNOSIS — J181 Lobar pneumonia, unspecified organism: Secondary | ICD-10-CM | POA: Diagnosis not present

## 2019-07-19 DIAGNOSIS — N250 Renal osteodystrophy: Secondary | ICD-10-CM | POA: Diagnosis not present

## 2019-07-19 DIAGNOSIS — I10 Essential (primary) hypertension: Secondary | ICD-10-CM | POA: Diagnosis present

## 2019-07-19 DIAGNOSIS — Z86718 Personal history of other venous thrombosis and embolism: Secondary | ICD-10-CM | POA: Diagnosis not present

## 2019-07-19 DIAGNOSIS — I120 Hypertensive chronic kidney disease with stage 5 chronic kidney disease or end stage renal disease: Secondary | ICD-10-CM | POA: Diagnosis present

## 2019-07-19 DIAGNOSIS — Z20822 Contact with and (suspected) exposure to covid-19: Secondary | ICD-10-CM | POA: Diagnosis not present

## 2019-07-19 DIAGNOSIS — N2581 Secondary hyperparathyroidism of renal origin: Secondary | ICD-10-CM | POA: Diagnosis not present

## 2019-07-19 DIAGNOSIS — Z8679 Personal history of other diseases of the circulatory system: Secondary | ICD-10-CM

## 2019-07-19 DIAGNOSIS — Z79899 Other long term (current) drug therapy: Secondary | ICD-10-CM

## 2019-07-19 DIAGNOSIS — Z87891 Personal history of nicotine dependence: Secondary | ICD-10-CM

## 2019-07-19 DIAGNOSIS — D61818 Other pancytopenia: Secondary | ICD-10-CM | POA: Diagnosis not present

## 2019-07-19 DIAGNOSIS — R0602 Shortness of breath: Secondary | ICD-10-CM | POA: Diagnosis not present

## 2019-07-19 DIAGNOSIS — B20 Human immunodeficiency virus [HIV] disease: Secondary | ICD-10-CM | POA: Diagnosis not present

## 2019-07-19 DIAGNOSIS — Z91040 Latex allergy status: Secondary | ICD-10-CM | POA: Diagnosis not present

## 2019-07-19 DIAGNOSIS — R197 Diarrhea, unspecified: Secondary | ICD-10-CM | POA: Diagnosis not present

## 2019-07-19 DIAGNOSIS — Z8673 Personal history of transient ischemic attack (TIA), and cerebral infarction without residual deficits: Secondary | ICD-10-CM | POA: Diagnosis not present

## 2019-07-19 DIAGNOSIS — M4182 Other forms of scoliosis, cervical region: Secondary | ICD-10-CM | POA: Diagnosis not present

## 2019-07-19 DIAGNOSIS — Z7401 Bed confinement status: Secondary | ICD-10-CM | POA: Diagnosis not present

## 2019-07-19 DIAGNOSIS — Z90710 Acquired absence of both cervix and uterus: Secondary | ICD-10-CM

## 2019-07-19 DIAGNOSIS — K8689 Other specified diseases of pancreas: Secondary | ICD-10-CM | POA: Diagnosis present

## 2019-07-19 DIAGNOSIS — K529 Noninfective gastroenteritis and colitis, unspecified: Secondary | ICD-10-CM | POA: Diagnosis present

## 2019-07-19 DIAGNOSIS — K219 Gastro-esophageal reflux disease without esophagitis: Secondary | ICD-10-CM | POA: Diagnosis present

## 2019-07-19 DIAGNOSIS — R0600 Dyspnea, unspecified: Secondary | ICD-10-CM

## 2019-07-19 DIAGNOSIS — N186 End stage renal disease: Secondary | ICD-10-CM | POA: Diagnosis not present

## 2019-07-19 DIAGNOSIS — K838 Other specified diseases of biliary tract: Secondary | ICD-10-CM | POA: Diagnosis not present

## 2019-07-19 DIAGNOSIS — R279 Unspecified lack of coordination: Secondary | ICD-10-CM | POA: Diagnosis not present

## 2019-07-19 DIAGNOSIS — E860 Dehydration: Secondary | ICD-10-CM | POA: Diagnosis present

## 2019-07-19 DIAGNOSIS — A0472 Enterocolitis due to Clostridium difficile, not specified as recurrent: Principal | ICD-10-CM | POA: Diagnosis present

## 2019-07-19 DIAGNOSIS — J9601 Acute respiratory failure with hypoxia: Secondary | ICD-10-CM | POA: Diagnosis not present

## 2019-07-19 DIAGNOSIS — E739 Lactose intolerance, unspecified: Secondary | ICD-10-CM | POA: Diagnosis present

## 2019-07-19 DIAGNOSIS — D638 Anemia in other chronic diseases classified elsewhere: Secondary | ICD-10-CM | POA: Diagnosis not present

## 2019-07-19 DIAGNOSIS — K8020 Calculus of gallbladder without cholecystitis without obstruction: Secondary | ICD-10-CM | POA: Diagnosis present

## 2019-07-19 DIAGNOSIS — D631 Anemia in chronic kidney disease: Secondary | ICD-10-CM | POA: Diagnosis not present

## 2019-07-19 DIAGNOSIS — G894 Chronic pain syndrome: Secondary | ICD-10-CM | POA: Diagnosis not present

## 2019-07-19 DIAGNOSIS — Z880 Allergy status to penicillin: Secondary | ICD-10-CM | POA: Diagnosis not present

## 2019-07-19 DIAGNOSIS — D649 Anemia, unspecified: Secondary | ICD-10-CM | POA: Diagnosis not present

## 2019-07-19 DIAGNOSIS — E8889 Other specified metabolic disorders: Secondary | ICD-10-CM | POA: Diagnosis present

## 2019-07-19 DIAGNOSIS — F329 Major depressive disorder, single episode, unspecified: Secondary | ICD-10-CM | POA: Diagnosis present

## 2019-07-19 DIAGNOSIS — I4892 Unspecified atrial flutter: Secondary | ICD-10-CM | POA: Diagnosis present

## 2019-07-19 LAB — GLUCOSE, CAPILLARY
Glucose-Capillary: 66 mg/dL — ABNORMAL LOW (ref 70–99)
Glucose-Capillary: 71 mg/dL (ref 70–99)

## 2019-07-19 LAB — COMPREHENSIVE METABOLIC PANEL
ALT: 12 U/L (ref 0–44)
AST: 15 U/L (ref 15–41)
Albumin: 3.4 g/dL — ABNORMAL LOW (ref 3.5–5.0)
Alkaline Phosphatase: 114 U/L (ref 38–126)
Anion gap: 16 — ABNORMAL HIGH (ref 5–15)
BUN: 59 mg/dL — ABNORMAL HIGH (ref 8–23)
CO2: 27 mmol/L (ref 22–32)
Calcium: 9.1 mg/dL (ref 8.9–10.3)
Chloride: 94 mmol/L — ABNORMAL LOW (ref 98–111)
Creatinine, Ser: 9.29 mg/dL — ABNORMAL HIGH (ref 0.44–1.00)
GFR calc Af Amer: 5 mL/min — ABNORMAL LOW (ref >60)
GFR calc non Af Amer: 4 mL/min — ABNORMAL LOW (ref >60)
Glucose, Bld: 87 mg/dL (ref 70–99)
Potassium: 4.6 mmol/L (ref 3.5–5.1)
Sodium: 137 mmol/L (ref 135–145)
Total Bilirubin: 0.6 mg/dL (ref 0.3–1.2)
Total Protein: 7.9 g/dL (ref 6.5–8.1)

## 2019-07-19 LAB — CBC WITH DIFFERENTIAL/PLATELET
Abs Immature Granulocytes: 0.01 10*3/uL (ref 0.00–0.07)
Basophils Absolute: 0 10*3/uL (ref 0.0–0.1)
Basophils Relative: 0 %
Eosinophils Absolute: 0.1 10*3/uL (ref 0.0–0.5)
Eosinophils Relative: 3 %
HCT: 29.6 % — ABNORMAL LOW (ref 36.0–46.0)
Hemoglobin: 8.9 g/dL — ABNORMAL LOW (ref 12.0–15.0)
Immature Granulocytes: 0 %
Lymphocytes Relative: 25 %
Lymphs Abs: 1.1 10*3/uL (ref 0.7–4.0)
MCH: 34.6 pg — ABNORMAL HIGH (ref 26.0–34.0)
MCHC: 30.1 g/dL (ref 30.0–36.0)
MCV: 115.2 fL — ABNORMAL HIGH (ref 80.0–100.0)
Monocytes Absolute: 0.4 10*3/uL (ref 0.1–1.0)
Monocytes Relative: 8 %
Neutro Abs: 2.8 10*3/uL (ref 1.7–7.7)
Neutrophils Relative %: 64 %
Platelets: 117 10*3/uL — ABNORMAL LOW (ref 150–400)
RBC: 2.57 MIL/uL — ABNORMAL LOW (ref 3.87–5.11)
RDW: 14.1 % (ref 11.5–15.5)
WBC: 4.4 10*3/uL (ref 4.0–10.5)
nRBC: 0 % (ref 0.0–0.2)

## 2019-07-19 LAB — LACTIC ACID, PLASMA: Lactic Acid, Venous: 0.8 mmol/L (ref 0.5–1.9)

## 2019-07-19 LAB — SARS CORONAVIRUS 2 BY RT PCR (HOSPITAL ORDER, PERFORMED IN ~~LOC~~ HOSPITAL LAB): SARS Coronavirus 2: NEGATIVE

## 2019-07-19 IMAGING — CT CT ABD-PELV W/ CM
2 of 5 series · 15 of 46 positions shown, 17 images · IV contrast (Omnipaque or Isovue)
Comparison: [DATE]

CLINICAL DATA: Chronic pancytopenia, abdominal pain. Possible
hernia

EXAM:
CT ABDOMEN AND PELVIS WITH CONTRAST
TECHNIQUE: Multidetector CT imaging of the abdomen and pelvis was performed
using the standard protocol following bolus administration of
intravenous contrast.
CONTRAST:  100mL OMNIPAQUE IOHEXOL 300 MG/ML  SOLN

[Series 2: axial st · axial · 0.63mm/px · z∈[+772,+1112]mm · 12 of 78 slices shown, 14 images]
[im 5/78  soft-tissue]
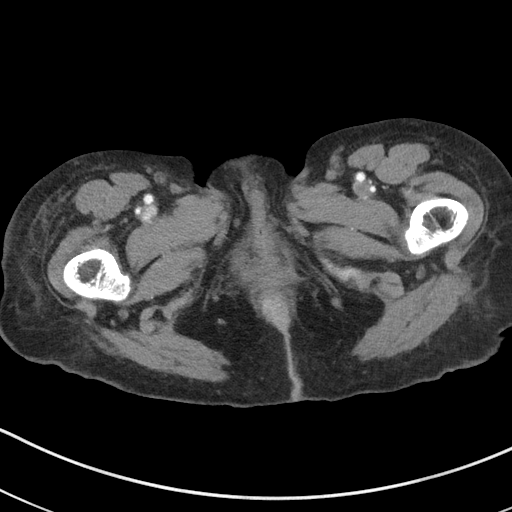
[im 5/78  bone]
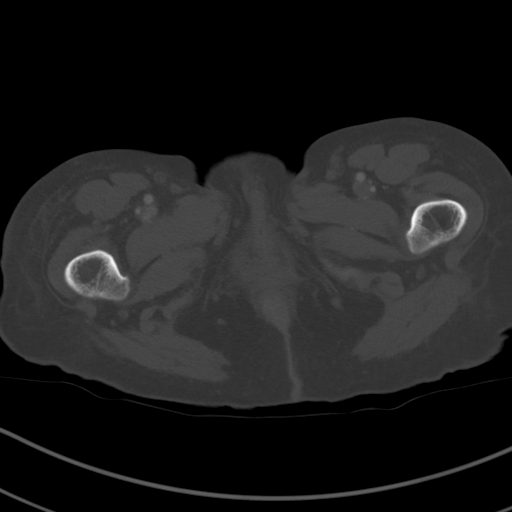
[im 10/78  soft-tissue]
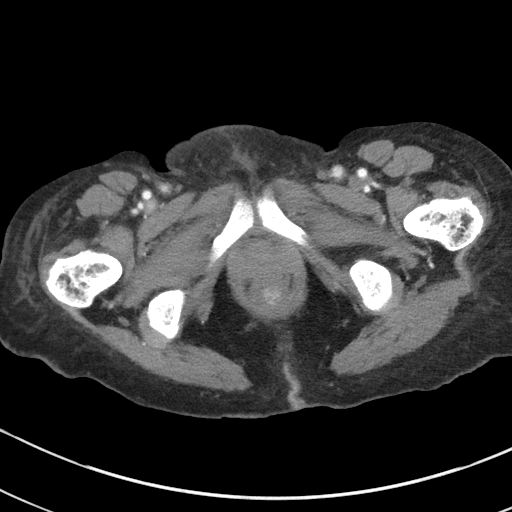
[im 20/78  soft-tissue]
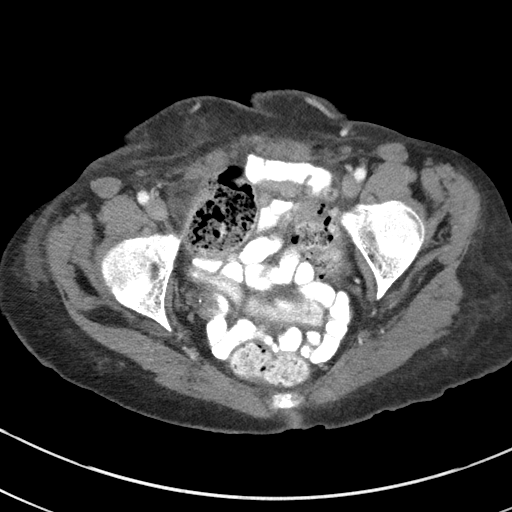
[im 25/78  soft-tissue]
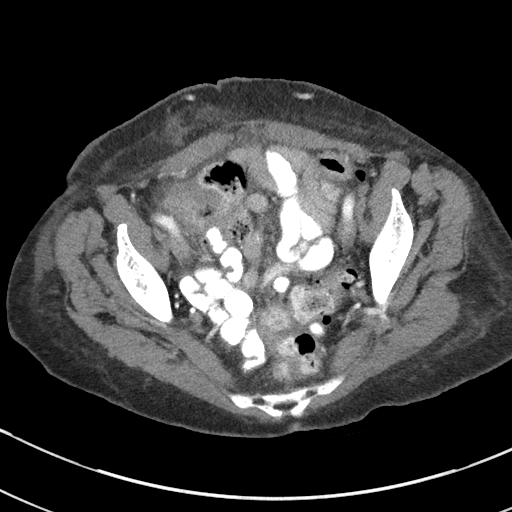
[im 29/78  soft-tissue]
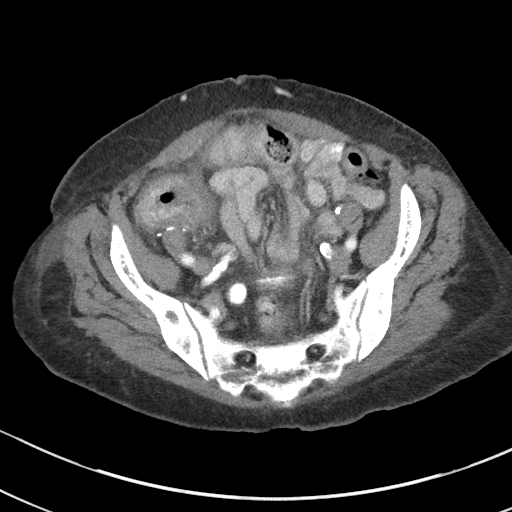
[im 34/78  soft-tissue]
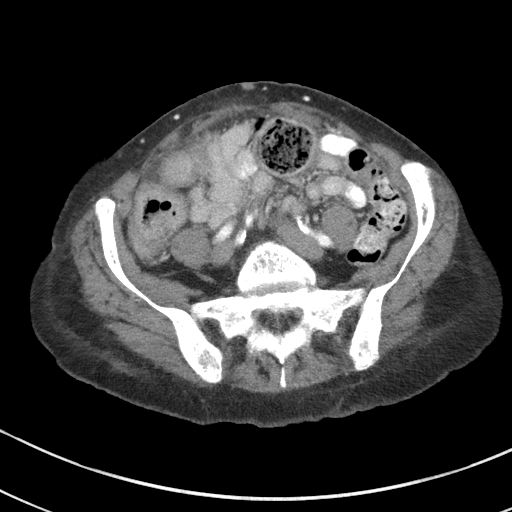
[im 44/78  soft-tissue]
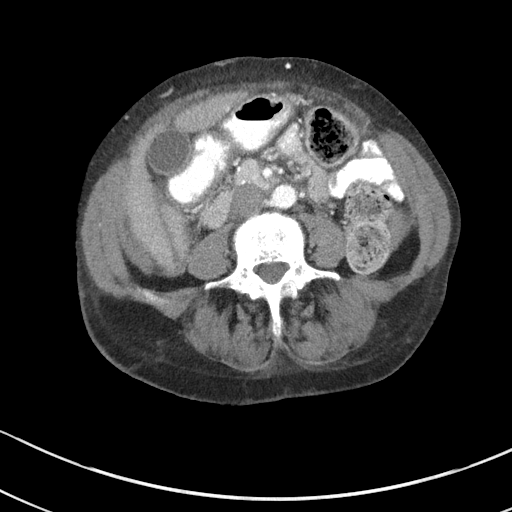
[im 49/78  soft-tissue]
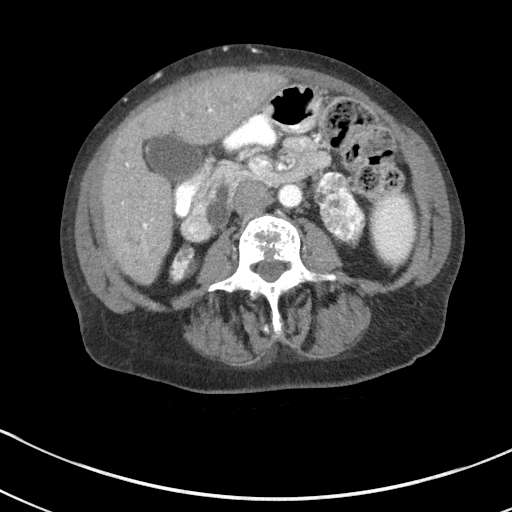
[im 53/78  soft-tissue]
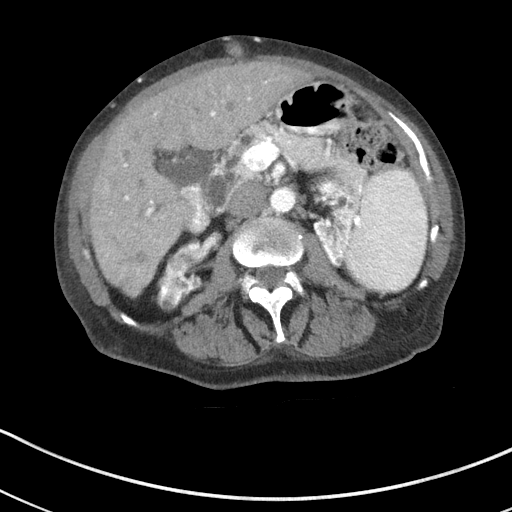
[im 53/78  bone]
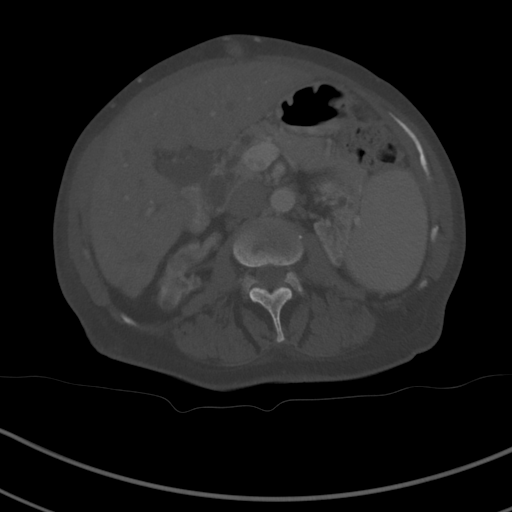
[im 58/78  soft-tissue]
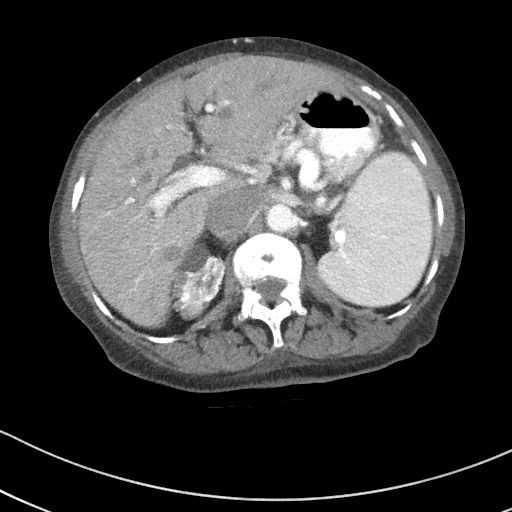
[im 68/78  soft-tissue]
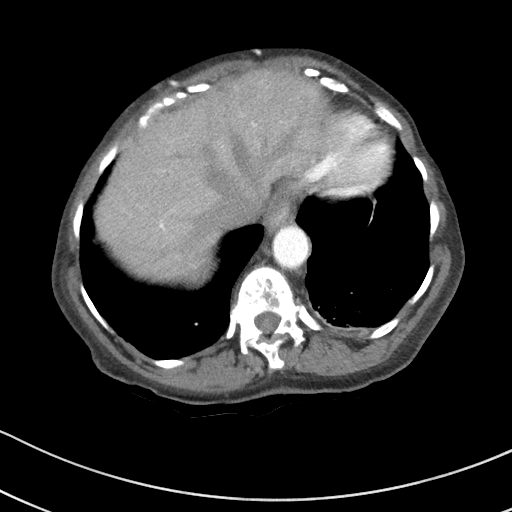
[im 73/78  soft-tissue]
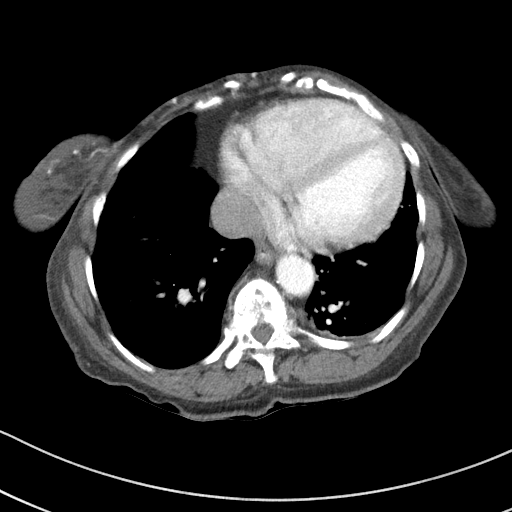

[Series 6: coronal st · coronal · 0.64mm/px · 3 of 94 slices shown]
[im 32/94  soft-tissue]
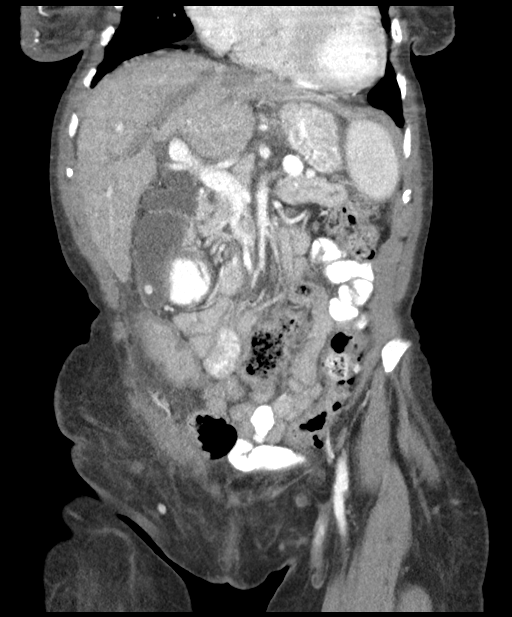
[im 42/94  soft-tissue]
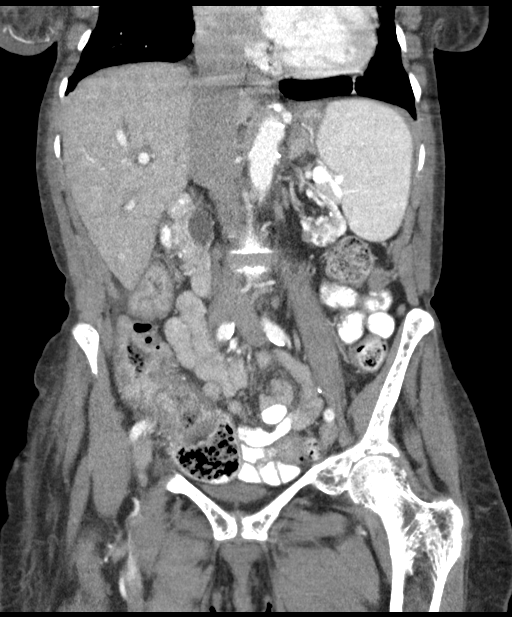
[im 52/94  soft-tissue]
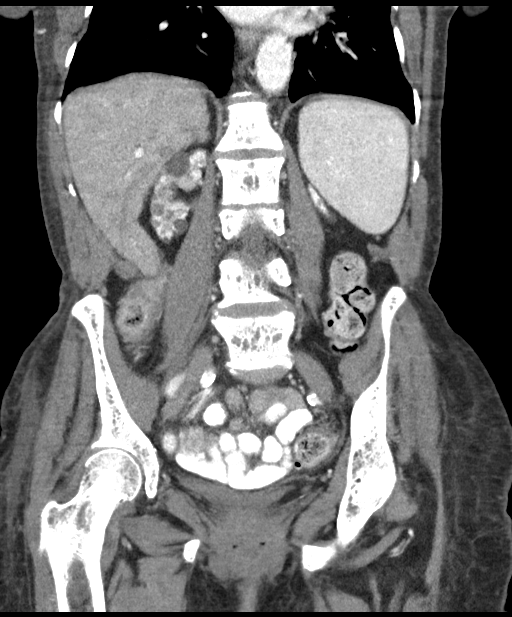

[15 of 46 positions shown; findings below may reference images not displayed]

FINDINGS: Lower chest: Cardiomegaly. Chronic skin thickening and edematous
appearance of the visualized right breast, similar in appearance to
prior CT [DATE]. Visualized lung bases are clear.

Hepatobiliary: No focal liver lesion. Small layering hyperdensity
within the gallbladder lumen likely reflecting a small stone. The
common bile duct is dilated measuring 15 mm the level of the
pancreatic head (previously measured 9 mm). No discrete stone within
the common bile duct.

Pancreas: The main pancreatic duct is dilated measuring up to 8 mm,
which is new from prior. No discrete pancreatic lesion. No
peripancreatic inflammatory changes.

Spleen: Stable mild splenomegaly.

Adrenals/Urinary Tract: Unremarkable adrenal glands. Atrophic
kidneys with innumerable predominantly low-density lesions likely
reflecting cystic disease of dialysis. Urinary bladder is largely
decompressed, limiting its evaluation.

Stomach/Bowel: The bowel is well distended with oral contrast. There
is prominent circumferential bowel wall thickening involving the
cecum, ascending colon, and proximal transverse colon. Appendix not
definitively visualized. Scattered sigmoid diverticulosis. No bowel
obstruction. Stomach appears within normal limits.

Vascular/Lymphatic: Aortoiliac atherosclerosis without aneurysm. No
abdominopelvic lymphadenopathy is visualized.

Reproductive: Status post hysterectomy. No adnexal masses.

Other: No ascites. No abdominopelvic fluid collection. No
pneumoperitoneum. Previously seen small 12 x 9 mm supraumbilical
abdominal wall hernia is now hyperattenuating with stranding within
the hernia sac (series 7, image 38).

Musculoskeletal: Diffusely abnormal sclerotic appearance of the
osseous structures compatible with chronic renal osteodystrophy. No
acute fracture identified.
IMPRESSION: 1. Prominent circumferential bowel wall thickening involving the
cecum, ascending colon, and proximal transverse colon compatible
with a infectious or inflammatory colitis.
2. Small 1.2 cm supraumbilical abdominal wall hernia previously
containing only fat with new increased density/stranding within the
hernia sac concerning for strangulation.
3. Interval dilation of the common bile duct and main pancreatic
duct without definitive obstructive etiology. Further evaluation
with MRI/MRCP is recommended. Non-emergent MRI should be deferred
until patient has been discharged for the acute illness, and can
optimally cooperate with positioning and breath-holding
instructions.
4. Chronic skin thickening and edematous changes to the visualized
right breast, similar to prior CT [DATE]. Additional chronic findings, as above.

## 2019-07-19 IMAGING — DX DG CHEST 1V PORT
1 series · 1 of 1 positions shown · non-contrast
Comparison: [DATE].

CLINICAL DATA: 68-year-old with shortness of breath. Current
history of HIV, hypertension, pulmonary hypertension and atrial
flutter. Former smoker. Negative [ET] test earlier today.

EXAM:
PORTABLE CHEST 1 VIEW

[chest ap]
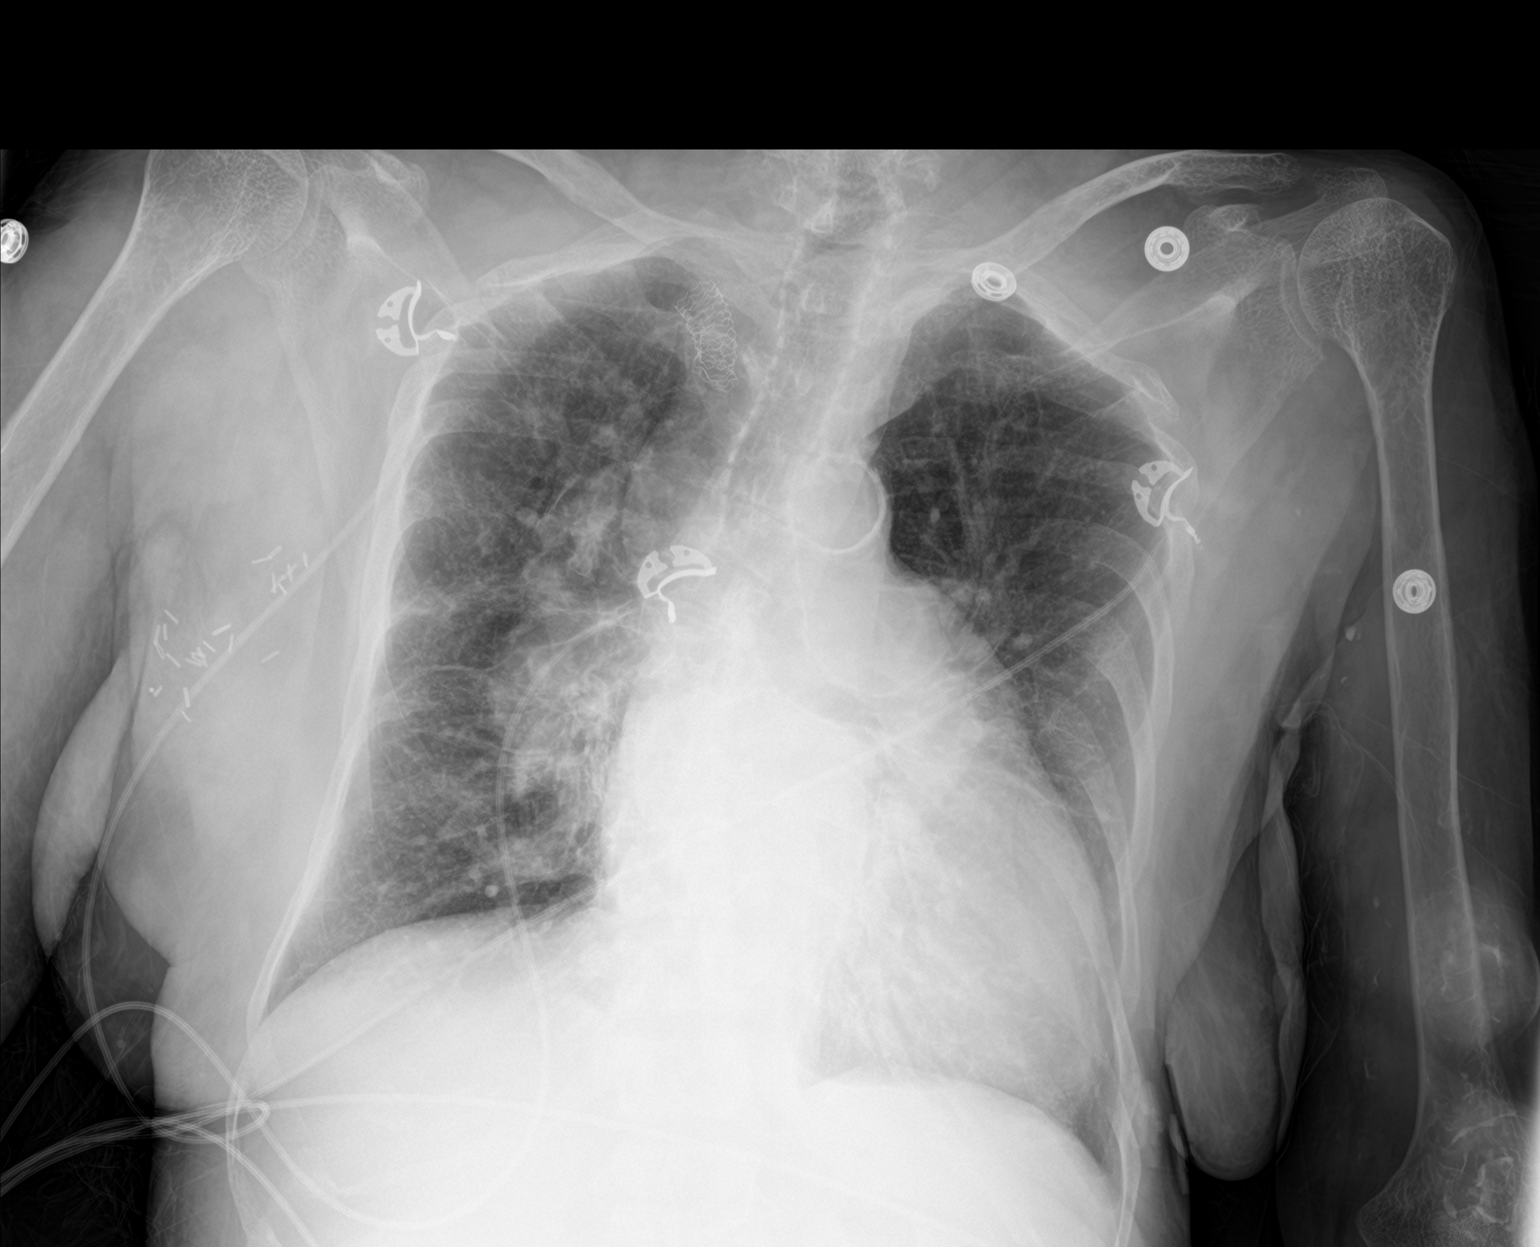

[1 of 1 positions shown; findings below may reference images not displayed]

FINDINGS: Cardiac silhouette moderately enlarged, unchanged. Thoracic aorta
atherosclerotic, unchanged. Massive enlargement of the central
pulmonary arteries, even more so than on the examination 2 years
ago.

Patchy airspace opacities with air bronchograms medially in the LEFT
lung base and to a lesser degree medially in the RIGHT lung base.
Lungs otherwise clear. Pulmonary vascularity normal without evidence
of pulmonary edema. No visible pleural effusions. RIGHT subclavian
vein stent. Surgical clips in the low RIGHT axilla/axillary tail of
the RIGHT breast.
IMPRESSION: 1. Bibasilar pneumonia, LEFT greater than RIGHT.
2. Stable cardiomegaly without evidence of pulmonary edema.
3. Massive enlargement of the central pulmonary arteries indicating
pulmonary arterial hypertension, with interval increase in size of
the pulmonary arteries since the examination 2 years ago.

## 2019-07-19 IMAGING — CT CT NECK W/ CM
3 of 4 series · 12 of 33 positions shown, 14 images · IV contrast (Omnipaque or Isovue)
Comparison: Comparison made with prior neck CT from [DATE].

CLINICAL DATA: Initial evaluation for shortness of breath,
difficulty with breathing.

EXAM:
CT NECK WITH CONTRAST
TECHNIQUE: Multidetector CT imaging of the neck was performed using the
standard protocol following the bolus administration of intravenous
contrast.
CONTRAST:  75mL OMNIPAQUE IOHEXOL 300 MG/ML  SOLN

[Series 4: cor neck · coronal · 0.39mm/px · 3 of 116 slices shown]
[im 38/116  bone]
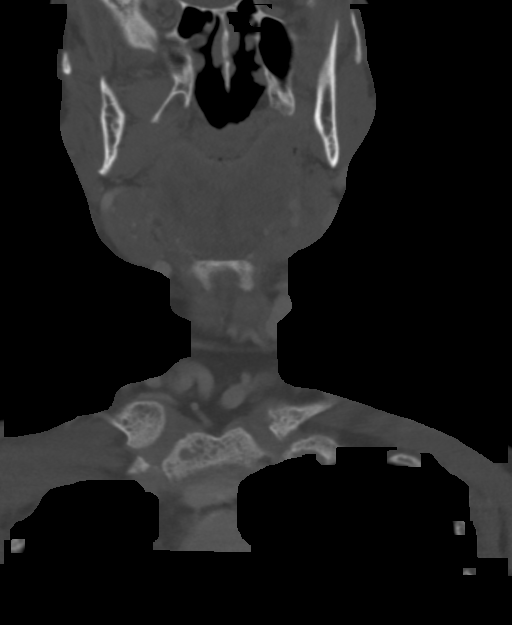
[im 52/116  bone]
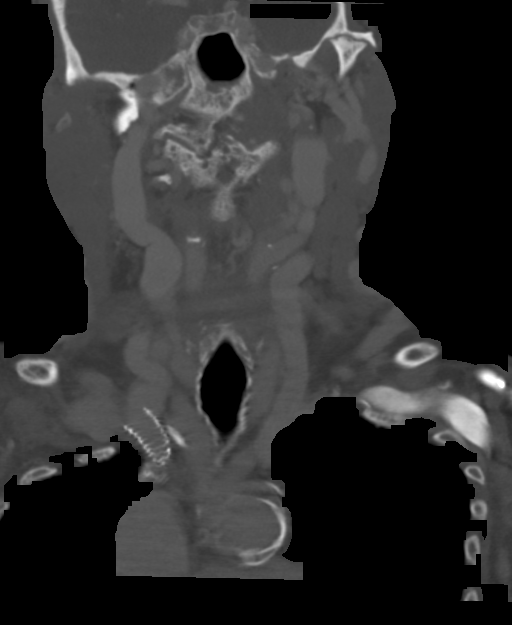
[im 66/116  bone]
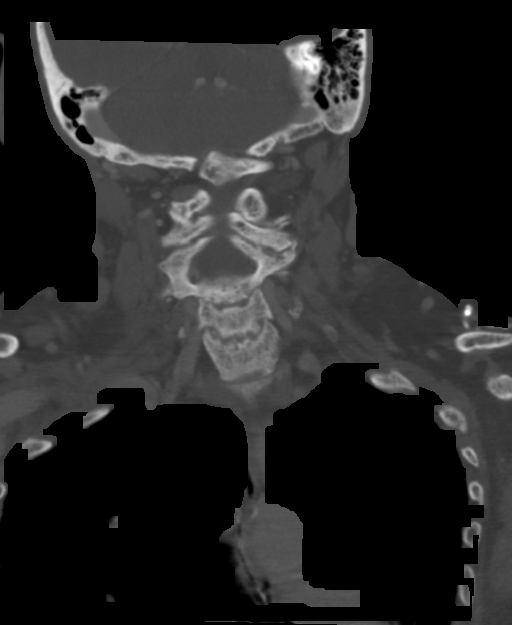

[Series 5: sag neck · sagittal · 0.45mm/px · 5 of 92 slices shown, 6 images]
[im 31/92  bone]
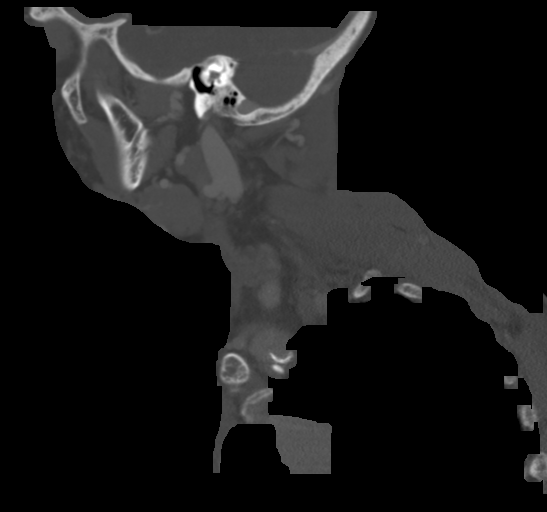
[im 38/92  bone]
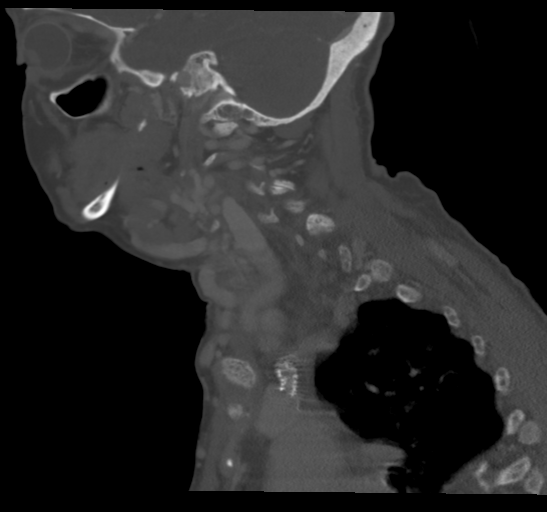
[im 46/92  soft-tissue]
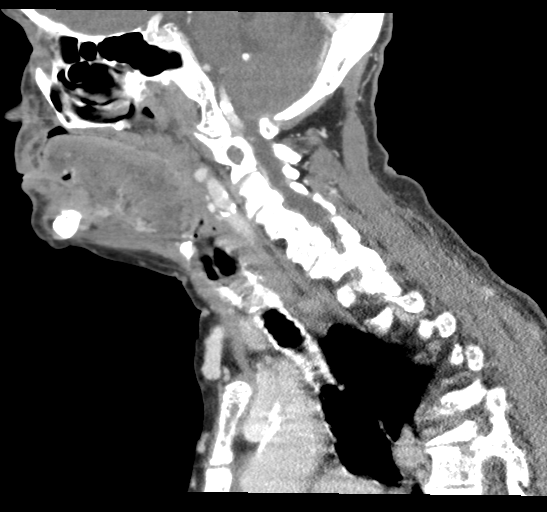
[im 46/92  bone]
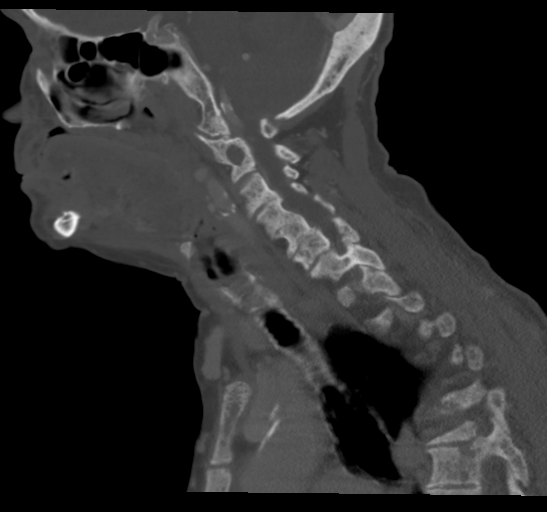
[im 54/92  bone]
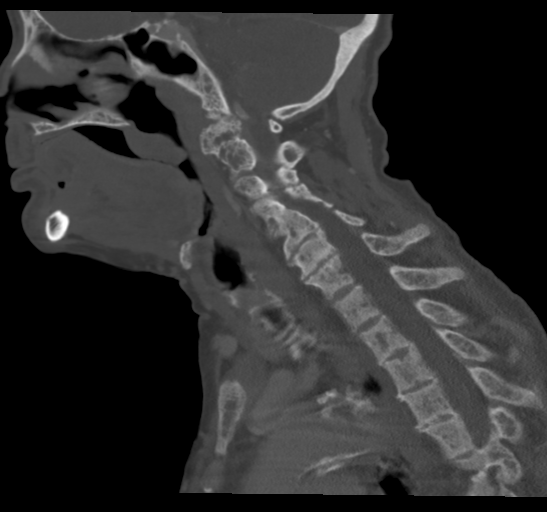
[im 61/92  bone]
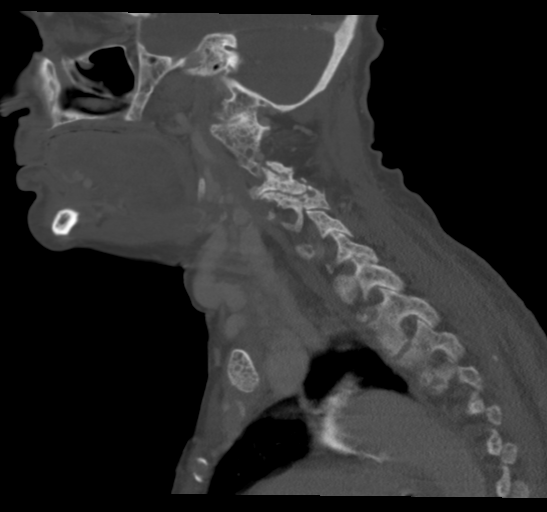

[Series 6: ax oropharynx · axial · 0.35mm/px · z∈[-131,+16]mm · 4 of 116 slices shown, 5 images]
[im 20/116  soft-tissue]
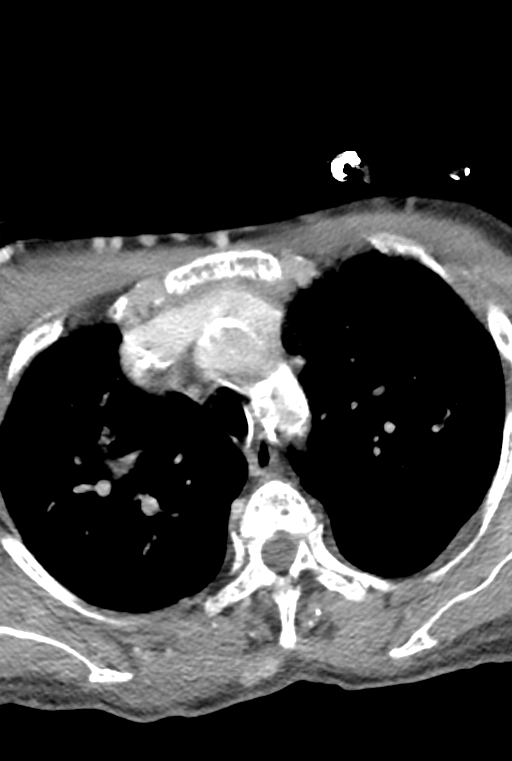
[im 20/116  bone]
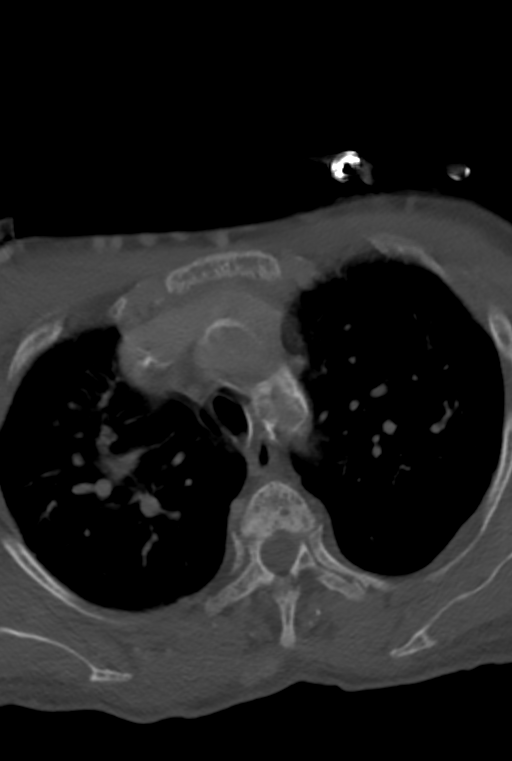
[im 39/116  bone]
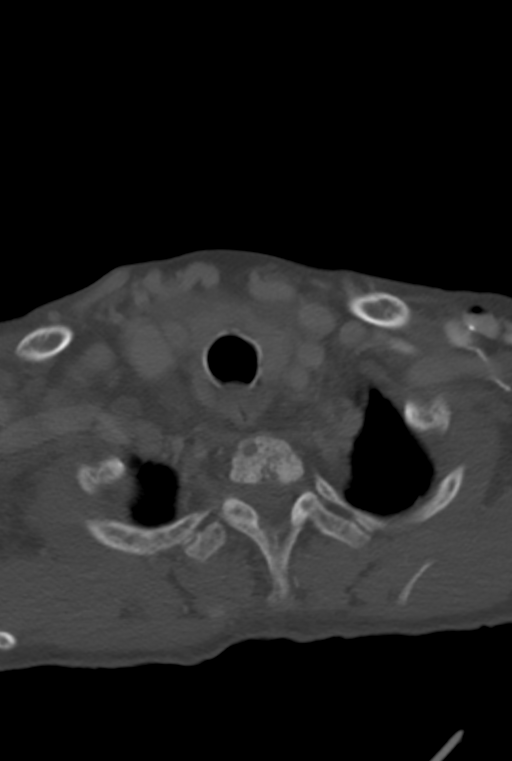
[im 77/116  bone]
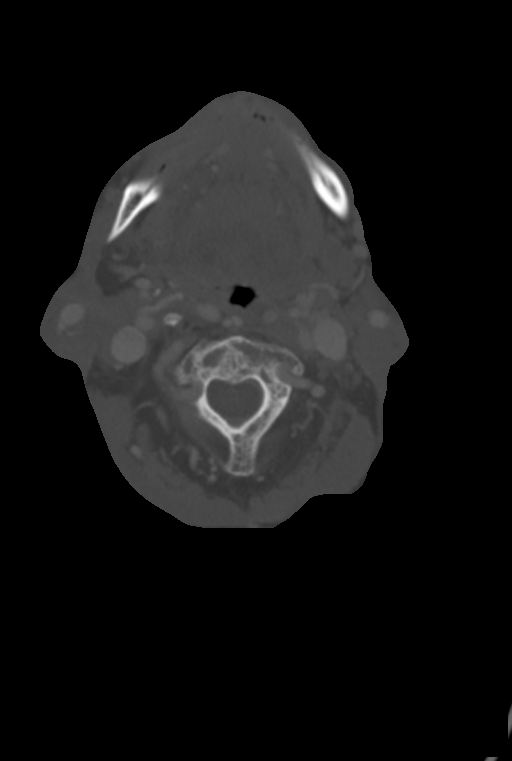
[im 96/116  bone]
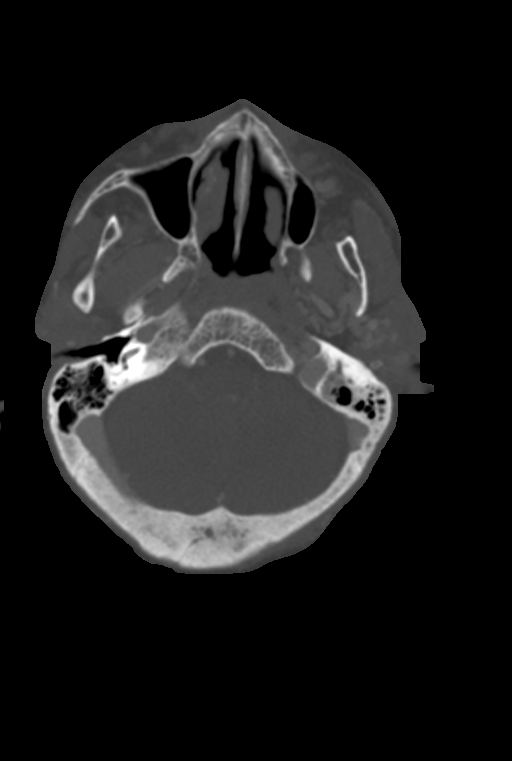

[12 of 33 positions shown; findings below may reference images not displayed]

FINDINGS: Pharynx and larynx: Examination somewhat technically limited by
patient positioning.

Oral cavity within normal limits without discrete mass or
collection. Patient is edentulous. No base of tongue lesion.
Oropharynx within normal limits. No evidence for tonsillitis or
acute peritonsillar abscess. Mild asymmetric fullness at the right
nasopharynx favored to be related to positioning. No discrete mass
identified. No retropharyngeal collection or swelling. Epiglottis
within normal limits. Vallecula clear. Remainder of the hypopharynx
and supraglottic larynx within normal limits. True cords fairly
symmetric and normal. Subglottic airway largely clear.

Salivary glands: Salivary glands including the parotid and
submandibular glands are within normal limits.

Thyroid: Few subcentimeter dystrophic calcifications noted within
the thyroid. Thyroid otherwise unremarkable.

Lymph nodes: No pathologically enlarged lymph nodes seen within the
neck. Mildly prominent 6 mm left level V node with surrounding
inflammatory stranding noted (series 6, image 54), indeterminate,
but could be reactive in nature.

Vascular: Moderate atherosclerotic change present about the aortic
arch, carotid bifurcations, and carotid siphons. Diffuse tortuosity
the major arterial vasculature of the neck suggesting chronic
underlying hypertension. Both carotid artery systems partially
medialized into the retropharyngeal space. Vascular stent in place
within the distal right subclavian vein as it dumps into the SVC.
Patent flow seen through the stent. Multiple scattered venous
collateral seen throughout the upper chest wall, suggesting possible
previous SVC thrombosis.

Limited intracranial: Unremarkable.

Visualized orbits: Globes and orbital soft tissues within normal
limits.

Mastoids and visualized paranasal sinuses: Visualized paranasal
sinuses are clear. Visualized mastoids and middle ear cavities are
well pneumatized and free of fluid.

Skeleton: No acute osseous abnormality. Bones are diffusely
sclerotic with somewhat permeative appearance, consistent with
underlying renal osteodystrophy. Cervicothoracic scoliotic curvature
with underlying moderate multilevel degenerative spondylosis, most
pronounced at C4-5 through C6-7. At ROBZY axial fusion noted.
Prominent degenerative changes noted about the TMJs bilaterally.

Upper chest: Hazy fat stranding within the visualized right chest
wall and axillary region favored to be related to vascular
congestion. Visualized upper chest demonstrates no other acute
abnormality. Partially visualized lungs are largely clear. Mild
paraseptal emphysema.

Other: None.
IMPRESSION: 1. Mildly prominent 6 mm left level V node with surrounding
inflammatory stranding, indeterminate, but could be reactive in
nature.
2. No other acute abnormality within the neck.
3. Diffuse tortuosity the major arterial vasculature of the neck
suggesting chronic underlying hypertension.
4. Vascular stent in place within the distal right subclavian vein
with patent flow. Multiple prominent venous collaterals throughout
the neck and upper chest wall.
5. Cervicothoracic scoliotic curvature with underlying moderate
multilevel degenerative spondylosis, most pronounced at C4-5 through
C6-7.
6. Diffuse sclerosis with somewhat permeative appearance of the
bones, most likely related to underlying renal osteodystrophy.
7. Aortic Atherosclerosis ([04]-[04]) and Emphysema ([04]-[04]).

## 2019-07-19 MED ORDER — METRONIDAZOLE IN NACL 5-0.79 MG/ML-% IV SOLN
500.0000 mg | Freq: Once | INTRAVENOUS | Status: AC
  Administered 2019-07-19: 500 mg via INTRAVENOUS
  Filled 2019-07-19: qty 100

## 2019-07-19 MED ORDER — ZIDOVUDINE 100 MG PO CAPS
100.0000 mg | ORAL_CAPSULE | Freq: Three times a day (TID) | ORAL | Status: DC
  Administered 2019-07-19 – 2019-07-22 (×8): 100 mg via ORAL
  Filled 2019-07-19 (×16): qty 1

## 2019-07-19 MED ORDER — IOHEXOL 300 MG/ML  SOLN
75.0000 mL | Freq: Once | INTRAMUSCULAR | Status: AC | PRN
  Administered 2019-07-19: 75 mL via INTRAVENOUS

## 2019-07-19 MED ORDER — SODIUM CHLORIDE 0.9 % IV SOLN: 2.0000 g | INTRAVENOUS | Status: DC

## 2019-07-19 MED ORDER — FENTANYL 50 MCG/HR TD PT72
1.0000 | MEDICATED_PATCH | TRANSDERMAL | Status: DC
  Administered 2019-07-19: 1 via TRANSDERMAL
  Filled 2019-07-19: qty 1

## 2019-07-19 MED ORDER — TRAZODONE HCL 50 MG PO TABS
50.0000 mg | ORAL_TABLET | Freq: Every day | ORAL | Status: DC
  Administered 2019-07-19 – 2019-07-22 (×4): 50 mg via ORAL
  Filled 2019-07-19 (×4): qty 1

## 2019-07-19 MED ORDER — METRONIDAZOLE IN NACL 5-0.79 MG/ML-% IV SOLN
500.0000 mg | Freq: Three times a day (TID) | INTRAVENOUS | Status: DC
  Administered 2019-07-20 (×2): 500 mg via INTRAVENOUS
  Filled 2019-07-19 (×2): qty 100

## 2019-07-19 MED ORDER — CHLORHEXIDINE GLUCONATE CLOTH 2 % EX PADS: 6.0000 | MEDICATED_PAD | Freq: Every day | CUTANEOUS | Status: DC

## 2019-07-19 MED ORDER — IOHEXOL 9 MG/ML PO SOLN: 500.0000 mL | ORAL | Status: DC

## 2019-07-19 MED ORDER — RENA-VITE PO TABS
1.0000 | ORAL_TABLET | Freq: Every day | ORAL | Status: DC
  Administered 2019-07-19 – 2019-07-22 (×4): 1 via ORAL
  Filled 2019-07-19 (×4): qty 1

## 2019-07-19 MED ORDER — ONDANSETRON HCL 4 MG PO TABS: 4.0000 mg | ORAL_TABLET | Freq: Four times a day (QID) | ORAL | Status: DC | PRN

## 2019-07-19 MED ORDER — HEPARIN SODIUM (PORCINE) 5000 UNIT/ML IJ SOLN
5000.0000 [IU] | Freq: Three times a day (TID) | INTRAMUSCULAR | Status: DC
  Administered 2019-07-19 – 2019-07-22 (×9): 5000 [IU] via SUBCUTANEOUS
  Filled 2019-07-19 (×9): qty 1

## 2019-07-19 MED ORDER — IOHEXOL 300 MG/ML  SOLN
100.0000 mL | Freq: Once | INTRAMUSCULAR | Status: AC | PRN
  Administered 2019-07-19: 100 mL via INTRAVENOUS

## 2019-07-19 MED ORDER — LAMIVUDINE 10 MG/ML PO SOLN
50.0000 mg | Freq: Every day | ORAL | Status: DC
  Administered 2019-07-21 – 2019-07-22 (×2): 50 mg via ORAL
  Filled 2019-07-19 (×6): qty 5

## 2019-07-19 MED ORDER — CALCIUM ACETATE (PHOS BINDER) 667 MG/5ML PO SOLN
2001.0000 mg | Freq: Three times a day (TID) | ORAL | Status: DC
  Filled 2019-07-19 (×5): qty 15

## 2019-07-19 MED ORDER — ACETAMINOPHEN 325 MG PO TABS
650.0000 mg | ORAL_TABLET | Freq: Four times a day (QID) | ORAL | Status: DC | PRN
  Administered 2019-07-22: 650 mg via ORAL
  Filled 2019-07-19: qty 2

## 2019-07-19 MED ORDER — DILTIAZEM HCL 60 MG PO TABS
60.0000 mg | ORAL_TABLET | Freq: Two times a day (BID) | ORAL | Status: DC
  Filled 2019-07-19: qty 1

## 2019-07-19 MED ORDER — ONDANSETRON HCL 4 MG/2ML IJ SOLN
4.0000 mg | Freq: Four times a day (QID) | INTRAMUSCULAR | Status: DC | PRN
  Administered 2019-07-20 – 2019-07-22 (×2): 4 mg via INTRAVENOUS
  Filled 2019-07-19 (×2): qty 2

## 2019-07-19 MED ORDER — SODIUM CHLORIDE 0.9 % IV SOLN
500.0000 mg | INTRAVENOUS | Status: DC
  Administered 2019-07-19 – 2019-07-21 (×3): 500 mg via INTRAVENOUS
  Filled 2019-07-19 (×3): qty 500

## 2019-07-19 MED ORDER — SODIUM CHLORIDE 0.9 % IV SOLN
2.0000 g | Freq: Once | INTRAVENOUS | Status: AC
  Administered 2019-07-19: 2 g via INTRAVENOUS
  Filled 2019-07-19: qty 2

## 2019-07-19 MED ORDER — POLYETHYLENE GLYCOL 3350 17 G PO PACK: 17.0000 g | PACK | Freq: Every day | ORAL | Status: DC | PRN

## 2019-07-19 MED ORDER — METOPROLOL TARTRATE 50 MG PO TABS
50.0000 mg | ORAL_TABLET | Freq: Two times a day (BID) | ORAL | Status: DC
  Filled 2019-07-19: qty 1

## 2019-07-19 MED ORDER — SODIUM CHLORIDE 0.9 % IV SOLN: 2.0000 g | Freq: Once | INTRAVENOUS | Status: DC

## 2019-07-19 MED ORDER — ACETAMINOPHEN 650 MG RE SUPP: 650.0000 mg | Freq: Four times a day (QID) | RECTAL | Status: DC | PRN

## 2019-07-19 MED ORDER — HYDROCODONE-ACETAMINOPHEN 5-325 MG PO TABS: 1.0000 | ORAL_TABLET | Freq: Four times a day (QID) | ORAL | Status: DC | PRN

## 2019-07-19 MED ORDER — SERTRALINE HCL 50 MG PO TABS: 25.0000 mg | ORAL_TABLET | Freq: Every day | ORAL | Status: DC

## 2019-07-19 MED ORDER — DEXTROSE-NACL 5-0.9 % IV SOLN: INTRAVENOUS | Status: AC

## 2019-07-19 MED ORDER — OXYCODONE HCL 5 MG PO TABS
10.0000 mg | ORAL_TABLET | Freq: Two times a day (BID) | ORAL | Status: DC | PRN
  Administered 2019-07-20 – 2019-07-21 (×2): 10 mg via ORAL
  Filled 2019-07-19 (×2): qty 2

## 2019-07-19 MED ORDER — DOLUTEGRAVIR SODIUM 50 MG PO TABS
50.0000 mg | ORAL_TABLET | Freq: Every day | ORAL | Status: DC
  Administered 2019-07-20 – 2019-07-22 (×3): 50 mg via ORAL
  Filled 2019-07-19 (×5): qty 1

## 2019-07-19 MED ORDER — MIRTAZAPINE 15 MG PO TABS
15.0000 mg | ORAL_TABLET | Freq: Every day | ORAL | Status: DC
  Administered 2019-07-19 – 2019-07-22 (×4): 15 mg via ORAL
  Filled 2019-07-19 (×4): qty 1

## 2019-07-19 MED ORDER — SERTRALINE HCL 50 MG PO TABS
125.0000 mg | ORAL_TABLET | Freq: Every day | ORAL | Status: DC
  Administered 2019-07-19 – 2019-07-22 (×4): 125 mg via ORAL
  Filled 2019-07-19 (×4): qty 3

## 2019-07-19 MED ORDER — MORPHINE SULFATE (PF) 4 MG/ML IV SOLN
4.0000 mg | Freq: Once | INTRAVENOUS | Status: AC
  Administered 2019-07-19: 4 mg via INTRAVENOUS
  Filled 2019-07-19: qty 1

## 2019-07-19 NOTE — Progress Notes (Signed)
[place] [place], [place] [address]   CLINIC:  Medical Oncology/Hematology  PCP:  [name], [name] [place] [id number] [phone]   REASON FOR VISIT: Follow-up for pancytopenia   CURRENT THERAPY: Observation    INTERVAL HISTORY:  [name] 69 y.o. female returns for routine follow-up for pancytopenia.  Patient reports severe acute onset abdominal pain with a masslike outpouching.  It is very tender to the touch.  She has not felt well over the past few days and has diarrhea. Denies any nausea and vomiting. Denies any new pains. Had not noticed any recent bleeding such as epistaxis, hematuria or hematochezia. Denies recent chest pain on exertion, shortness of breath on minimal exertion, pre-syncopal episodes, or palpitations. Denies any numbness or tingling in hands or feet. Denies any recent fevers, infections, or recent hospitalizations. Patient reports appetite at 0% and energy level at 0%.    REVIEW OF SYSTEMS:  Review of Systems  Constitutional: Positive for fatigue.  Gastrointestinal: Positive for abdominal distention, abdominal pain and diarrhea.  All other systems reviewed and are negative.    PAST MEDICAL/SURGICAL HISTORY:  Past Medical History:  Diagnosis Date  . Anemia   . Anxiety   . Atrial flutter ([place]) 02/22/2015  . C. difficile colitis 10/10/11  . Chronic diarrhea   . Depression   . ESRD on hemodialysis ([place])   . GERD (gastroesophageal reflux disease)   . HIV (human immunodeficiency virus infection) ([place])   . Hypertension   . Insomnia   . Intracranial hemorrhage ([place])   . Muscle weakness (generalized)   . Pulmonary HTN ([place])   . Tachycardia   . TIA (transient ischemic attack)   . Traumatic hematoma of right upper arm 02/22/2015   Past Surgical History:  Procedure Laterality Date  . A/V FISTULAGRAM N/A 04/17/2016   Procedure: A/V Fistulagram - Right Upper;  Surgeon: Waynetta Sandy, MD;  Location: [facility];  Service: Cardiovascular;  Laterality: N/A;  . BIOPSY THYROID    . Dialysis Shunts     previous one removed from left arm and now present one in right arm  . DIALYSIS/PERMA CATHETER INSERTION Right 04/17/2016   Procedure: dialysis Catheter Insertion central veinous;  Surgeon: Waynetta Sandy, MD;  Location: [facility];  Service: Cardiovascular;  Laterality: Right;  . ESOPHAGOGASTRODUODENOSCOPY  12/15/2010   Procedure: ESOPHAGOGASTRODUODENOSCOPY (EGD);  Surgeon: Daneil Dolin, MD;  [facility];  Service: Endoscopy;  Laterality: N/A;  . EXCISION OF BREAST BIOPSY Right 05/04/2012   Procedure: EXCISION OF BREAST BIOPSY;  Surgeon: Donato Heinz, MD;  Location: [facility];  Service: General;  Laterality: Right;  Right Excisional Breast Biopsy  . FISTULOGRAM Right 03/26/2014   Procedure: Right Arm Fistulogram with Venoplasty Right Subclavian Vein and Inominate Vein. Debridement Fistula Ulcer;  Surgeon: Conrad Berlin, MD;  Location: [facility];  Service: Vascular;  Laterality: Right;  . FISTULOGRAM Right 03/29/2017   Procedure: FISTULOGRAM COMPLEX RIGHT ARM with Balloon angioplasty;  Surgeon: Waynetta Sandy, MD;  Location: [facility];  Service: Vascular;  Laterality: Right;  . IR GENERIC HISTORICAL  05/19/2016   IR REMOVAL TUN CV CATH W/O FL 05/19/2016 Markus Daft, MD [facility]  . PERIPHERAL VASCULAR BALLOON ANGIOPLASTY Right 04/17/2016   Procedure: Peripheral Vascular Balloon Angioplasty;  Surgeon: Waynetta Sandy, MD;  Location: [facility];  Service: Cardiovascular;  Laterality: Right;  Central segment AV Fistula  .  PERIPHERAL VASCULAR BALLOON ANGIOPLASTY Right 03/14/2018   Procedure: PERIPHERAL VASCULAR BALLOON ANGIOPLASTY;  Surgeon: Waynetta Sandy, MD;  Location: [facility];  Service: Cardiovascular;  Laterality: Right;  subclavian vein  . PERIPHERAL VASCULAR INTERVENTION Right 03/14/2018   Procedure:  PERIPHERAL VASCULAR INTERVENTION;  Surgeon: Waynetta Sandy, MD;  Location: [facility];  Service: Cardiovascular;  Laterality: Right;  subclavian vein  . SHUNTOGRAM Right 10/19/2013   Procedure: FISTULOGRAM;  Surgeon: Conrad Welby, MD;  Location: [facility];  Service: Cardiovascular;  Laterality: Right;  . TONSILLECTOMY    . VISCERAL VENOGRAPHY Right 03/14/2018   Procedure: CENTRAL VENOGRAPHY;  Surgeon: Waynetta Sandy, MD;  Location: [facility];  Service: Cardiovascular;  Laterality: Right;  upper ext fistula     SOCIAL HISTORY:  Social History   Socioeconomic History  . Marital status: Widowed    Spouse name: Not on file  . Number of children: 1  . Years of education: Not on file  . Highest education level: Not on file  Occupational History  . Not on file  Tobacco Use  . Smoking status: Former Smoker    Quit date: 03/12/1983    Years since quitting: 36.3  . Smokeless tobacco: Never Used  Substance and Sexual Activity  . Alcohol use: No    Alcohol/week: 0.0 standard drinks  . Drug use: No  . Sexual activity: Not Currently  Other Topics Concern  . Not on file  Social History Narrative  . Not on file   Social Determinants of Health   Financial Resource Strain:   . Difficulty of Paying Living Expenses:   Food Insecurity:   . Worried About Charity fundraiser in the Last Year:   . Arboriculturist in the Last Year:   Transportation Needs:   . Film/video editor (Medical):   Marland Kitchen Lack of Transportation (Non-Medical):   Physical Activity:   . Days of Exercise per Week:   . Minutes of Exercise per Session:   Stress:   . Feeling of Stress :   Social Connections:   . Frequency of Communication with Friends and Family:   . Frequency of Social Gatherings with Friends and Family:   . Attends Religious Services:   . Active Member of Clubs or Organizations:   . Attends Archivist Meetings:   Marland Kitchen Marital Status:   Intimate Partner  Violence:   . Fear of Current or Ex-Partner:   . Emotionally Abused:   Marland Kitchen Physically Abused:   . Sexually Abused:     FAMILY HISTORY:  Family History  Problem Relation Age of Onset  . Anesthesia problems Neg Hx   . Hypotension Neg Hx   . Malignant hyperthermia Neg Hx   . Pseudochol deficiency Neg Hx     CURRENT MEDICATIONS:  No facility-administered encounter medications on file as of 07/19/2019.   Outpatient Encounter Medications as of 07/19/2019  Medication Sig Note  . Biotin 5000 MCG CAPS Take by mouth.   . cinacalcet (SENSIPAR) 30 MG tablet Take 60 mg by mouth at bedtime.    . clopidogrel (PLAVIX) 75 MG tablet Take 1 tablet (75 mg total) by mouth daily.   Marland Kitchen diltiazem (CARDIZEM) 60 MG tablet Take 60 mg by mouth 2 (two) times daily.   . dolutegravir (TIVICAY) 50 MG tablet Take 50 mg by mouth daily.   . fentaNYL (DURAGESIC - DOSED MCG/HR) 50 MCG/HR Apply one patch every 72 hours for pain.  Remove old patch. External use only. Rotate sites. (Patient taking differently: Place 50 mcg onto the skin every 3 (three) days. Remove old patch. External use only. Rotate sites.)   . lamivudine (EPIVIR) 100 MG tablet Take 50 mg by mouth daily.   . metoprolol (LOPRESSOR) 50 MG tablet Take 50 mg by mouth 2 (two) times daily.   . multivitamin (RENA-VIT) TABS tablet Take 1 tablet by mouth at bedtime.   . Nutritional Supplements (NOVASOURCE RENAL) LIQD Take 237 mLs by mouth 3 (three) times daily.   . pantoprazole (PROTONIX) 20 MG tablet Take 20 mg by mouth daily.   . promethazine (PHENERGAN) 12.5 MG tablet Take 12.5 mg by mouth every Monday, Wednesday, and Friday with hemodialysis. May take an additional 12.5 mg dose every 6 hours as needed for nausea   . sertraline (ZOLOFT) 25 MG tablet Take 25 mg by mouth daily. Take 25 mg with the 100 mg.   . SERTRALINE HCL PO Take 100 mg by mouth daily.  03/07/2018: MAR doesn't specify the strength of the tablet  . sevelamer carbonate (RENVELA) 800 MG tablet Take  2,400 mg by mouth 3 (three) times daily with meals.    . sildenafil (REVATIO) 20 MG tablet Take 20 mg by mouth 3 (three) times daily.    . vitamin B-12 (CYANOCOBALAMIN) 100 MCG tablet Take 100 mcg by mouth daily.   . vitamin C (ASCORBIC ACID) 500 MG tablet Take 500 mg by mouth 2 (two) times daily.   . zaleplon (SONATA) 5 MG capsule Take one capsule by mouth every night at bedtime for rest (Patient taking differently: Take 5 mg by mouth at bedtime. )   . zidovudine (RETROVIR) 100 MG capsule Take 100 mg by mouth 3 (three) times daily.     Marland Kitchen acetaminophen (TYLENOL) 325 MG tablet Take 650 mg by mouth every 4 (four) hours as needed for mild pain or fever. 03/08/2018: Standing order, not listed under medications   . alum & mag hydroxide-simeth (MAALOX/MYLANTA) 200-200-20 MG/5ML suspension Take 30 mLs by mouth every 2 (two) hours as needed for indigestion or heartburn. Max 4 doses per 24 hours   . bisacodyl (FLEET) 10 MG/30ML ENEM Place 10 mg rectally daily as needed (constipation). 03/08/2018: Standing order, not listed under medications   . glucagon (GLUCAGON EMERGENCY) 1 MG injection Inject 1 mg into the vein once as needed (if bs is < 60 and pt is unconscious). 03/08/2018: Standing order, not listed under medications   . ipratropium-albuterol (DUONEB) 0.5-2.5 (3) MG/3ML SOLN    . Lidocaine HCl (ASPERCREME W/LIDOCAINE) 4 % CREA Apply topically 2 (two) times daily as needed.   . lidocaine-prilocaine (EMLA) cream Apply 1 application topically 3 (three) times a week. Apply 1 hour prior to dialysis access site on Monday Wednesday and Friday   . ondansetron (ZOFRAN-ODT) 4 MG disintegrating tablet Take 4 mg by mouth every 4 (four) hours as needed for nausea or vomiting. If not resolved after 2 doses then take promethazine 25 mg 03/08/2018: Standing order, not listed under medications   . Oxycodone HCl 10 MG TABS Take 10 mg by mouth every 12 (twelve) hours as needed (severe pain).     ALLERGIES:  Allergies  Allergen  Reactions  . Lactose   . Lactose Intolerance (Gi) Other (See Comments)    On MAR  . Other   . Latex Rash and Itching  . Penicillins Rash and Swelling    Has patient had a PCN reaction causing immediate rash, facial/tongue/throat  swelling, SOB or lightheadedness with hypotension: No Has patient had a PCN reaction causing severe rash involving mucus membranes or skin necrosis: No Has patient had a PCN reaction that required hospitalization No Has patient had a PCN reaction occurring within the last 10 years: No If all of the above answers are "NO", then may proceed with Cephalosporin use.       PHYSICAL EXAM:  ECOG Performance status: 1  Vitals:   07/19/19 0924  BP: (!) 156/107  Pulse: 64  Resp: 18  Temp: (!) 97.5 F (36.4 C)  SpO2: 97%   Filed Weights   07/19/19 0924  Weight: 110 lb (49.9 kg)   Physical Exam Constitutional:      Appearance: Normal appearance. She is normal weight.  Cardiovascular:     Rate and Rhythm: Normal rate and regular rhythm.     Heart sounds: Normal heart sounds.  Pulmonary:     Effort: Pulmonary effort is normal.     Breath sounds: Normal breath sounds.  Abdominal:     Tenderness: There is abdominal tenderness. There is guarding.     Hernia: A hernia is present.  Musculoskeletal:        General: Normal range of motion.  Skin:    General: Skin is warm.  Neurological:     Mental Status: She is alert and oriented to person, place, and time. Mental status is at baseline.  Psychiatric:        Mood and Affect: Mood normal.        Behavior: Behavior normal.        Thought Content: Thought content normal.        Judgment: Judgment normal.      LABORATORY DATA:  I have reviewed the labs as listed.  CBC    Component Value Date/Time   WBC 4.4 07/19/2019 1104   RBC 2.57 (L) 07/19/2019 1104   HGB 8.9 (L) 07/19/2019 1104   HCT 29.6 (L) 07/19/2019 1104   PLT 117 (L) 07/19/2019 1104   MCV 115.2 (H) 07/19/2019 1104   MCH 34.6 (H)  07/19/2019 1104   MCHC 30.1 07/19/2019 1104   RDW 14.1 07/19/2019 1104   LYMPHSABS 1.1 07/19/2019 1104   MONOABS 0.4 07/19/2019 1104   EOSABS 0.1 07/19/2019 1104   BASOSABS 0.0 07/19/2019 1104   CMP Latest Ref Rng & Units 07/19/2019 07/12/2019 03/14/2018  Glucose 70 - 99 mg/dL 87 88 73  BUN 8 - 23 mg/dL 59(H) 20 52(H)  Creatinine 0.44 - 1.00 mg/dL 9.29(H) 5.81(H) 14.70(H)  Sodium 135 - 145 mmol/L 137 136 136  Potassium 3.5 - 5.1 mmol/L 4.6 3.6 4.4  Chloride 98 - 111 mmol/L 94(L) 95(L) 104  CO2 22 - 32 mmol/L 27 30 -  Calcium 8.9 - 10.3 mg/dL 9.1 9.9 -  Total Protein 6.5 - 8.1 g/dL 7.9 8.3(H) -  Total Bilirubin 0.3 - 1.2 mg/dL 0.6 0.7 -  Alkaline Phos 38 - 126 U/L 114 121 -  AST 15 - 41 U/L 15 21 -  ALT 0 - 44 U/L 12 12 -    All questions were answered to patient's stated satisfaction. Encouraged patient to call with any new concerns or questions before his next visit to the cancer center and we can certain see him sooner, if needed.    ASSESSMENT & PLAN:  Other pancytopenia ([place]) 1.  Pancytopenia: -CBC on 09/20/2018 showed white count 2.6, hemoglobin 9.4 and MCV 100, platelet count 128.  Differential showed 44% neutrophils. -Repeat CBC  in our office on 12/08/2018 showed white count 3.4 with 60% neutrophils.  Hemoglobin improved to 10.8 and platelet count was normal at 172. -B12 was normal with elevated methylmalonic acid of 700.  I have given a B12 shot today in the office.  She is taking B12 1 mg tablet daily. -We have done infectious disease panel including CMV, EBV and parvo B19 and hepatitis panel which were negative.  EBV IgG antibody was positive but IgM was negative. -Labs done on 07/12/2019 showed hemoglobin 9.5, WBC 3.0, platelets 114, ferritin 1004, percent saturation 33, vitamin B12 level 3770. -She will follow-up in 4 months with repeat labs and B12 levels.  2.  HIV: -She is on Epivir 50 milligrams daily, Retrovir 100 mg 3 times a day and Tivicay 50 mg daily. -CD4 count  and viral load are done periodically by infectious disease.  3. ESRD: -She undergoes hemodialysis on Mondays, Wednesdays and Fridays. -She missed her last dialysis appointment due to her not feeling well.  4.  Chronic pain: -She is on fentanyl 50 mcg and oxycodone 10 mg twice daily as needed.  5.  Vitamin D deficiency: -Labs done on 07/12/2019 showed vitamin D level 9.23 -We will start her on 50,000 units weekly.  6.  New acute onset abdominal pain/hernia: -Patient reports she has new onset abdominal pain in a small round mass on her abdomen that is new. -She has not felt good over the past few days and has severe abdominal pain.  It is very tender to the touch. -She reports she has not reported this to anyone. -We will send her to the ER to evaluate hernia/bowel strangulation.     Orders placed this encounter:  Orders Placed This Encounter  Procedures  . Lactate dehydrogenase  . Methylmalonic acid, serum  . CBC with Differential/Platelet  . Comprehensive metabolic panel  . Ferritin  . Iron and TIBC  . Vitamin B12  . VITAMIN D 25 Hydroxy (Vit-D Deficiency, Fractures)  . Folate      Tina Finders, FNP-C [place] [phone]

## 2019-07-19 NOTE — ED Notes (Signed)
Pt in bed with eyes closed, arouses easily to verbal stim, upon arousal pt reports that she is still having some abd pain.  Pt drinking Oral contrast for ct.

## 2019-07-19 NOTE — Consult Note (Addendum)
Reason for Consult: Abdominal wall hernia Referring Physician: Dr. Sheldon [name] is an 69 y.o. female.  HPI: Patient is a 69 year old black female with multiple medical problems including end-stage renal disease, TIA, HIV, chronic diarrhea, atrial flutter who was being seen by oncology for her pancytopenia and was found to have a ventral hernia.  She underwent a CT scan of the abdomen which revealed a small upper midline hernia.  It does not contain bowel.  It has been present in the past.  Patient is a poor historian.  She denies any emesis.  She is having some diarrhea.  Past Medical History:  Diagnosis Date  . Anemia   . Anxiety   . Atrial flutter ([place]) 02/22/2015  . C. difficile colitis 10/10/11  . Chronic diarrhea   . Depression   . ESRD on hemodialysis ([place])   . GERD (gastroesophageal reflux disease)   . HIV (human immunodeficiency virus infection) ([place])   . Hypertension   . Insomnia   . Intracranial hemorrhage ([place])   . Muscle weakness (generalized)   . Pulmonary HTN ([place])   . Tachycardia   . TIA (transient ischemic attack)   . Traumatic hematoma of right upper arm 02/22/2015    Past Surgical History:  Procedure Laterality Date  . A/V FISTULAGRAM N/A 04/17/2016   Procedure: A/V Fistulagram - Right Upper;  Surgeon: Waynetta Sandy, MD;  Location: [facility];  Service: Cardiovascular;  Laterality: N/A;  . BIOPSY THYROID    . Dialysis Shunts     previous one removed from left arm and now present one in right arm  . DIALYSIS/PERMA CATHETER INSERTION Right 04/17/2016   Procedure: dialysis Catheter Insertion central veinous;  Surgeon: Waynetta Sandy, MD;  Location: [facility];  Service: Cardiovascular;  Laterality: Right;  . ESOPHAGOGASTRODUODENOSCOPY  12/15/2010   Procedure: ESOPHAGOGASTRODUODENOSCOPY (EGD);  Surgeon: Daneil Dolin, MD;  [facility];  Service: Endoscopy;  Laterality: N/A;  . EXCISION OF BREAST BIOPSY  Right 05/04/2012   Procedure: EXCISION OF BREAST BIOPSY;  Surgeon: Donato Heinz, MD;  Location: [facility];  Service: General;  Laterality: Right;  Right Excisional Breast Biopsy  . FISTULOGRAM Right 03/26/2014   Procedure: Right Arm Fistulogram with Venoplasty Right Subclavian Vein and Inominate Vein. Debridement Fistula Ulcer;  Surgeon: Conrad [place], MD;  Location: [facility];  Service: Vascular;  Laterality: Right;  . FISTULOGRAM Right 03/29/2017   Procedure: FISTULOGRAM COMPLEX RIGHT ARM with Balloon angioplasty;  Surgeon: Waynetta Sandy, MD;  Location: [facility];  Service: Vascular;  Laterality: Right;  . IR GENERIC HISTORICAL  05/19/2016   IR REMOVAL TUN CV CATH W/O FL 05/19/2016 Tina Daft, MD [facility]  . PERIPHERAL VASCULAR BALLOON ANGIOPLASTY Right 04/17/2016   Procedure: Peripheral Vascular Balloon Angioplasty;  Surgeon: Waynetta Sandy, MD;  Location: [facility];  Service: Cardiovascular;  Laterality: Right;  Central segment AV Fistula  . PERIPHERAL VASCULAR BALLOON ANGIOPLASTY Right 03/14/2018   Procedure: PERIPHERAL VASCULAR BALLOON ANGIOPLASTY;  Surgeon: Waynetta Sandy, MD;  Location: [facility];  Service: Cardiovascular;  Laterality: Right;  subclavian vein  . PERIPHERAL VASCULAR INTERVENTION Right 03/14/2018   Procedure: PERIPHERAL VASCULAR INTERVENTION;  Surgeon: Waynetta Sandy, MD;  Location: [facility];  Service: Cardiovascular;  Laterality: Right;  subclavian vein  . SHUNTOGRAM Right 10/19/2013   Procedure: FISTULOGRAM;  Surgeon: Conrad Hecla, MD;  Location: [facility];  Service: Cardiovascular;  Laterality:  Right;  Marland Kitchen TONSILLECTOMY    . VISCERAL VENOGRAPHY Right 03/14/2018   Procedure: CENTRAL VENOGRAPHY;  Surgeon: Waynetta Sandy, MD;  Location: [facility];  Service: Cardiovascular;  Laterality: Right;  upper ext fistula    Family History  Problem Relation Age of Onset  . Anesthesia problems Neg Hx   .  Hypotension Neg Hx   . Malignant hyperthermia Neg Hx   . Pseudochol deficiency Neg Hx     Social History:  reports that she quit smoking about 36 years ago. She has never used smokeless tobacco. She reports that she does not drink alcohol or use drugs.  Allergies:  Allergies  Allergen Reactions  . Lactose   . Lactose Intolerance (Gi) Other (See Comments)    On MAR  . Other   . Latex Rash and Itching  . Penicillins Rash and Swelling    Has patient had a PCN reaction causing immediate rash, facial/tongue/throat swelling, SOB or lightheadedness with hypotension: No Has patient had a PCN reaction causing severe rash involving mucus membranes or skin necrosis: No Has patient had a PCN reaction that required hospitalization No Has patient had a PCN reaction occurring within the last 10 years: No If all of the above answers are "NO", then may proceed with Cephalosporin use.      Medications: I have reviewed the patient's current medications.  Results for orders placed or performed during the hospital encounter of 07/19/19 (from the past 48 hour(s))  CBC with Differential     Status: Abnormal   Collection Time: 07/19/19 11:04 AM  Result Value Ref Range   WBC 4.4 4.0 - 10.5 K/uL   RBC 2.57 (L) 3.87 - 5.11 MIL/uL   Hemoglobin 8.9 (L) 12.0 - 15.0 g/dL   HCT 29.6 (L) 36.0 - 46.0 %   MCV 115.2 (H) 80.0 - 100.0 fL   MCH 34.6 (H) 26.0 - 34.0 pg   MCHC 30.1 30.0 - 36.0 g/dL   RDW 14.1 11.5 - 15.5 %   Platelets 117 (L) 150 - 400 K/uL    Comment: SPECIMEN CHECKED FOR CLOTS PLATELET COUNT CONFIRMED BY SMEAR REPEATED TO VERIFY    nRBC 0.0 0.0 - 0.2 %   Neutrophils Relative % 64 %   Neutro Abs 2.8 1.7 - 7.7 K/uL   Lymphocytes Relative 25 %   Lymphs Abs 1.1 0.7 - 4.0 K/uL   Monocytes Relative 8 %   Monocytes Absolute 0.4 0.1 - 1.0 K/uL   Eosinophils Relative 3 %   Eosinophils Absolute 0.1 0.0 - 0.5 K/uL   Basophils Relative 0 %   Basophils Absolute 0.0 0.0 - 0.1 K/uL   Immature  Granulocytes 0 %   Abs Immature Granulocytes 0.01 0.00 - 0.07 K/uL   Schistocytes PRESENT    Polychromasia PRESENT     Comment: Performed at [facility], [address] [place], [place] [address]  Comprehensive metabolic panel     Status: Abnormal   Collection Time: 07/19/19 11:04 AM  Result Value Ref Range   Sodium 137 135 - 145 mmol/L   Potassium 4.6 3.5 - 5.1 mmol/L   Chloride 94 (L) 98 - 111 mmol/L   CO2 27 22 - 32 mmol/L   Glucose, Bld 87 70 - 99 mg/dL    Comment: Glucose reference range applies only to samples taken after fasting for at least 8 hours.   BUN 59 (H) 8 - 23 mg/dL   Creatinine, Ser 9.29 (H) 0.44 - 1.00 mg/dL  Calcium 9.1 8.9 - 10.3 mg/dL   Total Protein 7.9 6.5 - 8.1 g/dL   Albumin 3.4 (L) 3.5 - 5.0 g/dL   AST 15 15 - 41 U/L   ALT 12 0 - 44 U/L   Alkaline Phosphatase 114 38 - 126 U/L   Total Bilirubin 0.6 0.3 - 1.2 mg/dL   GFR calc non Af Amer 4 (L) >60 mL/min   GFR calc Af Amer 5 (L) >60 mL/min   Anion gap 16 (H) 5 - 15    Comment: Performed at [facility], [address], [place], [place] [address]  Lactic acid, plasma     Status: None   Collection Time: 07/19/19 11:04 AM  Result Value Ref Range   Lactic Acid, Venous 0.8 0.5 - 1.9 mmol/L    Comment: Performed at [facility], [address] [place], [place] [address]    CT ABDOMEN PELVIS W CONTRAST  Result Date: 07/19/2019 CLINICAL DATA:  Chronic pancytopenia, abdominal pain. Possible hernia EXAM: CT ABDOMEN AND PELVIS WITH CONTRAST TECHNIQUE: Multidetector CT imaging of the abdomen and pelvis was performed using the standard protocol following bolus administration of intravenous contrast. CONTRAST:  145mL OMNIPAQUE IOHEXOL 300 MG/ML  SOLN COMPARISON:  10/07/2011 FINDINGS: Lower chest: Cardiomegaly. Chronic skin thickening and edematous appearance of the visualized right breast, similar in appearance to prior CT 10/07/2011. Visualized lung bases are clear. Hepatobiliary: No focal liver lesion. Small layering  hyperdensity within the gallbladder lumen likely reflecting a small stone. The common bile duct is dilated measuring 15 mm the level of the pancreatic head (previously measured 9 mm). No discrete stone within the common bile duct. Pancreas: The main pancreatic duct is dilated measuring up to 8 mm, which is new from prior. No discrete pancreatic lesion. No peripancreatic inflammatory changes. Spleen: Stable mild splenomegaly. Adrenals/Urinary Tract: Unremarkable adrenal glands. Atrophic kidneys with innumerable predominantly low-density lesions likely reflecting cystic disease of dialysis. Urinary bladder is largely decompressed, limiting its evaluation. Stomach/Bowel: The bowel is well distended with oral contrast. There is prominent circumferential bowel wall thickening involving the cecum, ascending colon, and proximal transverse colon. Appendix not definitively visualized. Scattered sigmoid diverticulosis. No bowel obstruction. Stomach appears within normal limits. Vascular/Lymphatic: Aortoiliac atherosclerosis without aneurysm. No abdominopelvic lymphadenopathy is visualized. Reproductive: Status post hysterectomy. No adnexal masses. Other: No ascites. No abdominopelvic fluid collection. No pneumoperitoneum. Previously seen small 12 x 9 mm supraumbilical abdominal wall hernia is now hyperattenuating with stranding within the hernia sac (series 7, image 38). Musculoskeletal: Diffusely abnormal sclerotic appearance of the osseous structures compatible with chronic renal osteodystrophy. No acute fracture identified. IMPRESSION: 1. Prominent circumferential bowel wall thickening involving the cecum, ascending colon, and proximal transverse colon compatible with a infectious or inflammatory colitis. 2. Small 1.2 cm supraumbilical abdominal wall hernia previously containing only fat with new increased density/stranding within the hernia sac concerning for strangulation. 3. Interval dilation of the common bile duct and  main pancreatic duct without definitive obstructive etiology. Further evaluation with MRI/MRCP is recommended. Non-emergent MRI should be deferred until patient has been discharged for the acute illness, and can optimally cooperate with positioning and breath-holding instructions. 4. Chronic skin thickening and edematous changes to the visualized right breast, similar to prior CT 2013. 5. Additional chronic findings, as above. Electronically Signed   By: Davina Poke D.O.   On: 07/19/2019 15:19    ROS:  Review of systems not obtained due to patient factors.  Blood pressure 113/62, pulse 62, temperature 97.9 F (36.6 C),  temperature source Oral, resp. rate 14, height 5\' 2"  (1.575 m), weight 49.8 kg, SpO2 100 %. Physical Exam: Comfortable black female in no acute distress Head is normocephalic, atraumatic Lungs are clear to auscultation with good breath sounds bilaterally Heart examination reveals regular rate and rhythm without S3, S4, murmurs Abdomen is soft and flat.  An approximately 1.5 cm subcutaneous mass is present halfway between the subxiphoid process and the umbilicus.  It is soft.  It is not reducible.  No rigidity is noted.  Bowel sounds are present.  CT scan images personally reviewed  Assessment/Plan: Impression: Small ventral hernia containing fat tissue only.  Multiple comorbidities including end-stage renal disease are present.  It is over the liver and there is no bowel involved.  Given her comorbidities, there is no need for surgical intervention at this time.  This was discussed with Dr. Denton Brick.  Will follow with you.  Colitis is noted, nonsurgical at this time.  [name] 07/19/2019, 4:49 PM

## 2019-07-19 NOTE — H&P (Addendum)
History and Physical    [name] SHF:[id number] DOB: 1950-09-08 DOA: 07/19/2019  PCP: Hilbert Corrigan, MD   Patient coming from: [name]  I have personally briefly reviewed patient's old medical records in [place]  Chief Complaint: Abdominal Pain  HPI: [name] is a 69 y.o. female with medical history significant for HIV, atrial fibrillation, ESRD, hypertension.  Patient presented to the ED with complaints of abdominal pain diarrhea and fevers.  Symptoms started with a lump on her abdomen, about a week ago.  4 days ago she started having abdominal pain loose stools or fevers.  She reports diarrhea is improving. She also reports difficulty breathing, she is unable to tell me duration.  She feels something in her throat is making it difficult to breathe, she thinks it is her lymph nodes.  She tells me because of this her voice has changed.  But she is unable to tell me how long this has been going on.  She denies cough. Patient's HD schedule is Tuesday Thursday Saturday, she missed Tuesday session because she was not feeling well.  She denies chest pain.  She is not on home O2.   ED Course: Temperature 98.2.  Heart rate 50s to 60s.  WBC 4.4.  Patient was placed on 3 L O2 due to low O2 saturations.  Normal lactic acid 0.8.  Abdominal pelvis with contrast  -with inflammatory or infectious colitis, and small supraumbilical abdominal wall hernia, with new increased density/stranding concerning for strangulation, interval dilation of the common bile duct and main pancreatic duct-nonemergent MRI/MRCP recommended.  IV cefepime and metronidazole started in the ED.  General surgery was consulted, hospitalist to admit for colitis and bowel strangulation.  Review of Systems: As per HPI all other systems reviewed and negative.  Past Medical History:  Diagnosis Date  . Anemia   . Anxiety   . Atrial flutter ([place]) 02/22/2015  . C. difficile colitis 10/10/11  . Chronic  diarrhea   . Depression   . ESRD on hemodialysis ([place])   . GERD (gastroesophageal reflux disease)   . HIV (human immunodeficiency virus infection) ([place])   . Hypertension   . Insomnia   . Intracranial hemorrhage ([place])   . Muscle weakness (generalized)   . Pulmonary HTN ([place])   . Tachycardia   . TIA (transient ischemic attack)   . Traumatic hematoma of right upper arm 02/22/2015    Past Surgical History:  Procedure Laterality Date  . A/V FISTULAGRAM N/A 04/17/2016   Procedure: A/V Fistulagram - Right Upper;  Surgeon: Waynetta Sandy, MD;  Location: [facility];  Service: Cardiovascular;  Laterality: N/A;  . BIOPSY THYROID    . Dialysis Shunts     previous one removed from left arm and now present one in right arm  . DIALYSIS/PERMA CATHETER INSERTION Right 04/17/2016   Procedure: dialysis Catheter Insertion central veinous;  Surgeon: Waynetta Sandy, MD;  Location: [facility];  Service: Cardiovascular;  Laterality: Right;  . ESOPHAGOGASTRODUODENOSCOPY  12/15/2010   Procedure: ESOPHAGOGASTRODUODENOSCOPY (EGD);  Surgeon: Daneil Dolin, MD;  [facility];  Service: Endoscopy;  Laterality: N/A;  . EXCISION OF BREAST BIOPSY Right 05/04/2012   Procedure: EXCISION OF BREAST BIOPSY;  Surgeon: Donato Heinz, MD;  Location: [facility];  Service: General;  Laterality: Right;  Right Excisional Breast Biopsy  . FISTULOGRAM Right 03/26/2014   Procedure: Right Arm Fistulogram with Venoplasty Right Subclavian Vein and Inominate Vein. Debridement Fistula  Ulcer;  Surgeon: Conrad Bridgman, MD;  Location: [facility];  Service: Vascular;  Laterality: Right;  . FISTULOGRAM Right 03/29/2017   Procedure: FISTULOGRAM COMPLEX RIGHT ARM with Balloon angioplasty;  Surgeon: Waynetta Sandy, MD;  Location: [facility];  Service: Vascular;  Laterality: Right;  . IR GENERIC HISTORICAL  05/19/2016   IR REMOVAL TUN CV CATH W/O FL 05/19/2016 Markus Daft, MD [facility]  . PERIPHERAL  VASCULAR BALLOON ANGIOPLASTY Right 04/17/2016   Procedure: Peripheral Vascular Balloon Angioplasty;  Surgeon: Waynetta Sandy, MD;  Location: [facility];  Service: Cardiovascular;  Laterality: Right;  Central segment AV Fistula  . PERIPHERAL VASCULAR BALLOON ANGIOPLASTY Right 03/14/2018   Procedure: PERIPHERAL VASCULAR BALLOON ANGIOPLASTY;  Surgeon: Waynetta Sandy, MD;  Location: [facility];  Service: Cardiovascular;  Laterality: Right;  subclavian vein  . PERIPHERAL VASCULAR INTERVENTION Right 03/14/2018   Procedure: PERIPHERAL VASCULAR INTERVENTION;  Surgeon: Waynetta Sandy, MD;  Location: [facility];  Service: Cardiovascular;  Laterality: Right;  subclavian vein  . SHUNTOGRAM Right 10/19/2013   Procedure: FISTULOGRAM;  Surgeon: Conrad Chickaloon, MD;  Location: [facility];  Service: Cardiovascular;  Laterality: Right;  . TONSILLECTOMY    . VISCERAL VENOGRAPHY Right 03/14/2018   Procedure: CENTRAL VENOGRAPHY;  Surgeon: Waynetta Sandy, MD;  Location: [facility];  Service: Cardiovascular;  Laterality: Right;  upper ext fistula     reports that she quit smoking about 36 years ago. She has never used smokeless tobacco. She reports that she does not drink alcohol or use drugs.  Allergies  Allergen Reactions  . Lactose   . Lactose Intolerance (Gi) Other (See Comments)    On MAR  . Other   . Latex Rash and Itching  . Penicillins Rash and Swelling    Has patient had a PCN reaction causing immediate rash, facial/tongue/throat swelling, SOB or lightheadedness with hypotension: No Has patient had a PCN reaction causing severe rash involving mucus membranes or skin necrosis: No Has patient had a PCN reaction that required hospitalization No Has patient had a PCN reaction occurring within the last 10 years: No If all of the above answers are "NO", then may proceed with Cephalosporin use.      Family History  Problem Relation Age  of Onset  . Anesthesia problems Neg Hx   . Hypotension Neg Hx   . Malignant hyperthermia Neg Hx   . Pseudochol deficiency Neg Hx     Prior to Admission medications   Medication Sig Start Date End Date Taking? Authorizing Provider  acetaminophen (TYLENOL) 325 MG tablet Take 650 mg by mouth every 4 (four) hours as needed for mild pain or fever.    [provider]  alum & mag hydroxide-simeth (MAALOX/MYLANTA) 200-200-20 MG/5ML suspension Take 30 mLs by mouth every 2 (two) hours as needed for indigestion or heartburn. Max 4 doses per 24 hours    [provider]  Biotin 5000 MCG CAPS Take by mouth.    [provider]  bisacodyl (FLEET) 10 MG/30ML ENEM Place 10 mg rectally daily as needed (constipation).    [provider]  cinacalcet (SENSIPAR) 30 MG tablet Take 60 mg by mouth at bedtime.     [provider]  clopidogrel (PLAVIX) 75 MG tablet Take 1 tablet (75 mg total) by mouth daily. 03/29/17   Waynetta Sandy, MD  diltiazem (CARDIZEM) 60 MG tablet Take 60 mg by mouth 2 (two) times daily.  [provider]  dolutegravir (TIVICAY) 50 MG tablet Take 50 mg by mouth daily.    [provider]  fentaNYL (DURAGESIC - DOSED MCG/HR) 50 MCG/HR Apply one patch every 72 hours for pain. Remove old patch. External use only. Rotate sites. Patient taking differently: Place 50 mcg onto the skin every 3 (three) days. Remove old patch. External use only. Rotate sites. 02/28/15   Lauree Chandler, NP  glucagon (GLUCAGON EMERGENCY) 1 MG injection Inject 1 mg into the vein once as needed (if bs is < 60 and pt is unconscious).    [provider]  ipratropium-albuterol (DUONEB) 0.5-2.5 (3) MG/3ML SOLN  04/10/18   [provider]  lamivudine (EPIVIR) 100 MG tablet Take 50 mg by mouth daily.    [provider]  Lidocaine HCl (ASPERCREME W/LIDOCAINE) 4 % CREA Apply topically 2 (two) times daily as needed.    [provider]  lidocaine-prilocaine (EMLA) cream Apply 1 application topically 3 (three) times a week. Apply 1 hour prior to dialysis access site on Monday Wednesday and Friday    [provider]  metoprolol (LOPRESSOR) 50 MG tablet Take 50 mg by mouth 2 (two) times daily.    [provider]  multivitamin (RENA-VIT) TABS tablet Take 1 tablet by mouth at bedtime.    [provider]  Nutritional Supplements (NOVASOURCE RENAL) LIQD Take 237 mLs by mouth 3 (three) times daily.    [provider]  ondansetron (ZOFRAN-ODT) 4 MG disintegrating tablet Take 4 mg by mouth every 4 (four) hours as needed for nausea or vomiting. If not resolved after 2 doses then take promethazine 25 mg    [provider]  Oxycodone HCl 10 MG TABS Take 10 mg by mouth every 12 (twelve) hours as needed (severe pain).    [provider]  pantoprazole (PROTONIX) 20 MG tablet Take 20 mg by mouth daily.    [provider]  promethazine (PHENERGAN) 12.5 MG tablet Take 12.5 mg by mouth every Monday, Wednesday, and Friday with hemodialysis. May take an additional 12.5 mg dose every 6 hours as needed for nausea    [provider]  sertraline (ZOLOFT) 25 MG tablet Take 25 mg by mouth daily. Take 25 mg with the 100 mg.    [provider]  SERTRALINE HCL PO Take 100 mg by mouth daily.     [provider]  sevelamer carbonate (RENVELA) 800 MG tablet Take 2,400 mg by mouth 3 (three) times daily with meals.     [provider]  sildenafil (REVATIO) 20 MG tablet Take 20 mg by mouth 3 (three) times daily.     [provider]  vitamin B-12 (CYANOCOBALAMIN) 100 MCG tablet Take 100 mcg by mouth daily.    [provider]  vitamin C (ASCORBIC ACID) 500 MG tablet Take 500 mg by mouth 2 (two) times daily.    [provider]  zaleplon (SONATA) 5 MG capsule Take one capsule by mouth every night at bedtime for rest Patient taking  differently: Take 5 mg by mouth at bedtime.  02/19/15   Lauree Chandler, NP  zidovudine (RETROVIR) 100 MG capsule Take 100 mg by mouth 3 (three) times daily.      [provider]    Physical Exam: Vitals:   07/19/19 1300 07/19/19 1330 07/19/19 1400 07/19/19 1430  BP: 140/72 119/71 (!) 142/79 130/87  Pulse: (!) 54 (!) 46 (!) 53 (!) 53  Resp:  Temp:      TempSrc:      SpO2: 100% 96% 97% 99%  Weight:      Height:        Constitutional: NAD, calm, comfortable Vitals:   07/19/19 1300 07/19/19 1330 07/19/19 1400 07/19/19 1430  BP: 140/72 119/71 (!) 142/79 130/87  Pulse: (!) 54 (!) 46 (!) 53 (!) 53  Resp:      Temp:      TempSrc:      SpO2: 100% 96% 97% 99%  Weight:      Height:       Eyes: PERRL, lids and conjunctivae normal ENMT: Mucous membranes are dry, due to habitus and positioning unable to examine posterior pharynx. Neck: normal, supple, mild soft tissue swelling on the neck, very prominent distended neck veins Respiratory: Coarse expiratory rhonchi on auscultation likely from upper airway, no wheezing, no crackles. Normal respiratory effort. No accessory muscle use.  Cardiovascular: Regular rate and rhythm, no murmurs / rubs / gallops. No extremity edema.   Abdomen: Firm, no tenderness appreciated, no masses palpated. No hepatosplenomegaly. Musculoskeletal: no clubbing / cyanosis. No joint deformity upper and lower extremities. Good ROM, no contractures.  Skin: no rashes, lesions, ulcers. No induration Neurologic: No apparent cranial nerve abnormality, moving all extremities spontaneously. Psychiatric: Normal judgment and insight. Alert and oriented to person and place. Normal mood.   Labs on Admission: I have personally reviewed following labs and imaging studies  CBC: Recent Labs  Lab 07/19/19 1104  WBC 4.4  NEUTROABS 2.8  HGB 8.9*  HCT 29.6*  MCV 115.2*  PLT 678*   Basic Metabolic Panel: Recent Labs  Lab 07/19/19 1104  NA 137  K 4.6  CL  94*  CO2 27  GLUCOSE 87  BUN 59*  CREATININE 9.29*  CALCIUM 9.1   Liver Function Tests: Recent Labs  Lab 07/19/19 1104  AST 15  ALT 12  ALKPHOS 114  BILITOT 0.6  PROT 7.9  ALBUMIN 3.4*    Radiological Exams on Admission: CT ABDOMEN PELVIS W CONTRAST  Result Date: 07/19/2019 CLINICAL DATA:  Chronic pancytopenia, abdominal pain. Possible hernia EXAM: CT ABDOMEN AND PELVIS WITH CONTRAST TECHNIQUE: Multidetector CT imaging of the abdomen and pelvis was performed using the standard protocol following bolus administration of intravenous contrast. CONTRAST:  155mL OMNIPAQUE IOHEXOL 300 MG/ML  SOLN COMPARISON:  10/07/2011 FINDINGS: Lower chest: Cardiomegaly. Chronic skin thickening and edematous appearance of the visualized right breast, similar in appearance to prior CT 10/07/2011. Visualized lung bases are clear. Hepatobiliary: No focal liver lesion. Small layering hyperdensity within the gallbladder lumen likely reflecting a small stone. The common bile duct is dilated measuring 15 mm the level of the pancreatic head (previously measured 9 mm). No discrete stone within the common bile duct. Pancreas: The main pancreatic duct is dilated measuring up to 8 mm, which is new from prior. No discrete pancreatic lesion. No peripancreatic inflammatory changes. Spleen: Stable mild splenomegaly. Adrenals/Urinary Tract: Unremarkable adrenal glands. Atrophic kidneys with innumerable predominantly low-density lesions likely reflecting cystic disease of dialysis. Urinary bladder is largely decompressed, limiting its evaluation. Stomach/Bowel: The bowel is well distended with oral contrast. There is prominent circumferential bowel wall thickening involving the cecum, ascending colon, and proximal transverse colon. Appendix not definitively visualized. Scattered sigmoid diverticulosis. No bowel obstruction. Stomach appears within normal limits. Vascular/Lymphatic: Aortoiliac atherosclerosis without aneurysm. No  abdominopelvic lymphadenopathy is visualized. Reproductive: Status post hysterectomy. No adnexal masses. Other: No ascites. No abdominopelvic fluid collection. No pneumoperitoneum. Previously seen small  12 x 9 mm supraumbilical abdominal wall hernia is now hyperattenuating with stranding within the hernia sac (series 7, image 38). Musculoskeletal: Diffusely abnormal sclerotic appearance of the osseous structures compatible with chronic renal osteodystrophy. No acute fracture identified. IMPRESSION: 1. Prominent circumferential bowel wall thickening involving the cecum, ascending colon, and proximal transverse colon compatible with a infectious or inflammatory colitis. 2. Small 1.2 cm supraumbilical abdominal wall hernia previously containing only fat with new increased density/stranding within the hernia sac concerning for strangulation. 3. Interval dilation of the common bile duct and main pancreatic duct without definitive obstructive etiology. Further evaluation with MRI/MRCP is recommended. Non-emergent MRI should be deferred until patient has been discharged for the acute illness, and can optimally cooperate with positioning and breath-holding instructions. 4. Chronic skin thickening and edematous changes to the visualized right breast, similar to prior CT 2013. 5. Additional chronic findings, as above. Electronically Signed   By: Davina Poke D.O.   On: 07/19/2019 15:19    EKG: None.  Assessment/Plan Principal Problem:   Colitis Active Problems:   Human immunodeficiency virus (HIV) disease ([place])   Essential hypertension, malignant   Atrial fibrillation ([facility])   ESRD on dialysis ([name])   Atrial flutter ([place])   Ventral hernia without obstruction or gangrene   Colitis-abdominal pain, diarrhea, CT abdomen pelvis with contrast-findings compatible with infectious or inflammatory colitis involving the cecum, ascending colon and proximal transverse colon.  Afebrile, WBC 4.4.  Rules out for sepsis.   Clinically appears dehydrated. -Stool C. Difficile -Bowel rest with clear liquid diet -IV ceftriaxone and metronidazole -Very gentle hydration with D5+ N/s 40cc/hr x 12hrs - Hydrocodone 5mg  PRN for pain  Abdominal hernia- Ct - Increased density/stranding within the hernia sac concerning for strangulation.  Abdominal exam is benign, without tenderness or swelling appreciated.  Evaluated by general surgery, impression-small ventral hernia containing fat tissue only, no need for surgical intervention at this time. -Hold Plavix for now  Pneumonia -portable chest x-ray shows bibasilar pneumonia L > R. Concern for upper airway obstruction also.  History of enlarged lymph nodes, axillary mass. Received her COVID-19 vaccination.  COVID-19 test negative.  O2 sats rechecked on the floor, 100% on room air. -IV ceftriaxone, add IV azithromycin -Cervical neck CT with contrast for airway obstruction-mildly prominent 6 mm left UV node with inflammatory stranding probably reactive in nature, no other acute abnormality, diffuse tortuosity of major arterial vasculature in the neck suggesting chronic underlying hypertension. -EKG, check QTC  Incidental finding dilated common bile duct and pancreatic duct -liver enzymes normal.  Bilirubin normal. Per CT - interval dilation of the common bile duct and main pancreatic duct without definitive obstructive etiology. Further evaluation with MRI/MRCP is recommended. Non-emergent MRI should be deferred until patient has been discharged for the acute illness, and can optimally cooperate with positioning and breath-holding instructions.  Hypertension-stable. -Resume home Cardizem, metoprolol.  ESRD-schedule Tuesday Thursday, Saturday.  Missed last HD session yesterday Tuesday, because she was feeling ill. -Please consult nephrology in the morning for HD. - Resume sevelamer, cinacalcet  History of paroxysmal atrial flutter-per cardiology notes 2017- Only documented spell  was December 2016,  decision was made not to treat with anticoagulation at that time given other medical problems.   -Resume Cardizem and metoprolol. -Holding Plavix for now pending clinical course to ensure no procedures needed.  HIV- viral load 03/07/2019-undetectable. -Resume antivirals.  Depression-  -Resume sertraline  Chronic pain -Resume fentanly patch and as needed oxycodone  DVT prophylaxis: SCDs. Code Status: Full  code Family Communication: None at bedside.   Disposition Plan: ~ 2 days Consults called: General surgery.  Please consult nephrology in the morning. Admission status: Inpatient, telemetry I certify that at the point of admission it is my clinical judgment that the patient will require inpatient hospital care spanning beyond 2 midnights from the point of admission due to high intensity of service, high risk for further deterioration and high frequency of surveillance required. The following factors support the patient status of inpatient: Requiring IV antibiotics.   Bethena Roys MD [facility]  07/19/2019, 8:03 PM

## 2019-07-19 NOTE — ED Notes (Signed)
3L O2 Placed via [place] secondary to low O2 sat,

## 2019-07-19 NOTE — ED Provider Notes (Signed)
[facility] Provider Note   CSN: [id number] Arrival date & time: 07/19/19  9833     History Chief Complaint  Patient presents with   Hernia    [name] is a 69 y.o. female with a hx of anemia, a flutter, C. difficile, ESRD on hemodialysis (Tuesday Thursday Saturday), HIV (on Epivir, Retrovir and Tivicay), pancytopenia, TIA, chronic pain (taking fentanyl 50 mcg and oxycodone 10 mg twice daily as needed) presents to the Emergency Department complaining of gradual, persistent, progressively worsening generally feeling unwell onset 4 days ago.  Patient reports she began feeling ill Saturday night and through Sunday.  She reports generalized abdominal pain, diarrhea and fatigue.  She reports she did not go to dialysis on Tuesday due to feeling poorly.  She is continue to take her pain medications as directed.  She reports a new "lump" on her abdomen that is painful.  She denies vomiting, syncope.  Patient was evaluated by heme-onc for her pancytopenia today and referred to the emergency department by her nurse practitioner due to abdominal pain and palpable hernia.  Palpation makes her pain worse, nothing seems to make it better.  Level 5 caveat due to patient being a poor historian.  The majority of the history was obtained from record review including epic and records sent by the patient's nursing home.  MAR indicates compliance with medications.  The history is provided by the patient and medical records. No language interpreter was used.       Past Medical History:  Diagnosis Date   Anemia    Anxiety    Atrial flutter ([place]) 02/22/2015   C. difficile colitis 10/10/11   Chronic diarrhea    Depression    ESRD on hemodialysis (HCC)    GERD (gastroesophageal reflux disease)    HIV (human immunodeficiency virus infection) ([place])    Hypertension    Insomnia    Intracranial hemorrhage (HCC)    Muscle weakness (generalized)    Pulmonary HTN (HCC)     Tachycardia    TIA (transient ischemic attack)    Traumatic hematoma of right upper arm 02/22/2015    Patient Active Problem List   Diagnosis Date Noted   Hypocalcemia 03/07/2019   Other pancytopenia ([place]) 12/08/2018   Anemia of chronic disease 04/17/2016   Hepatitis B immune 01/28/2016   Atrial flutter ([place]) 02/22/2015   Hyperkalemia 02/20/2015   Venous stenosis of right upper extremity 04/06/2014   ESRD on dialysis ([place]) 04/06/2014   Essential hypertension    Nausea with vomiting 12/15/2012   CHF, chronic ([place]) 11/25/2012   Atrial fibrillation ([place]) 05/02/2012   Intracranial bleed ([place]) 12/28/2011   Angioedema of lips 09/21/2011   Cellulitis of breast 03/09/2011   Erosive esophagitis 12/15/2010   Mallory - Weiss tear 12/15/2010   UGI bleed 12/13/2010   DIARRHEA 04/12/2009   Human immunodeficiency virus (HIV) disease ([place]) 11/15/2008   PANCREATITIS, HX OF 11/15/2008   TOBACCO USE, QUIT 11/15/2008   PAIN IN JOINT, UPPER ARM 08/07/2008   ABDOMINAL WALL HERNIA 04/26/2007   BRONCHITIS, ACUTE 11/16/2006   HYPOKALEMIA, HX OF 07/13/2006   DEPRESSION 03/23/2006   Essential hypertension, malignant 03/23/2006   GERD 03/23/2006   PANCREATITIS 03/23/2006   MASS, RIGHT AXILLA 03/23/2006   HEADACHE 03/23/2006   SYMPTOM, ENLARGEMENT, LYMPH NODES 03/23/2006   SHINGLES, HX OF 03/23/2006   HYSTERECTOMY, TOTAL, HX OF 03/23/2006    Past Surgical History:  Procedure Laterality Date   A/V FISTULAGRAM N/A 04/17/2016  Procedure: A/V Fistulagram - Right Upper;  Surgeon: Waynetta Sandy, MD;  Location: [facility];  Service: Cardiovascular;  Laterality: N/A;   BIOPSY THYROID     Dialysis Shunts     previous one removed from left arm and now present one in right arm   DIALYSIS/PERMA CATHETER INSERTION Right 04/17/2016   Procedure: dialysis Catheter Insertion central veinous;  Surgeon: Waynetta Sandy, MD;  Location: [facility];  Service: Cardiovascular;  Laterality: Right;   ESOPHAGOGASTRODUODENOSCOPY  12/15/2010   Procedure: ESOPHAGOGASTRODUODENOSCOPY (EGD);  Surgeon: Daneil Dolin, MD;  [facility];  Service: Endoscopy;  Laterality: N/A;   EXCISION OF BREAST BIOPSY Right 05/04/2012   Procedure: EXCISION OF BREAST BIOPSY;  Surgeon: Donato Heinz, MD;  Location: [facility];  Service: General;  Laterality: Right;  Right Excisional Breast Biopsy   FISTULOGRAM Right 03/26/2014   Procedure: Right Arm Fistulogram with Venoplasty Right Subclavian Vein and Inominate Vein. Debridement Fistula Ulcer;  Surgeon: Conrad [place], MD;  Location: [facility];  Service: Vascular;  Laterality: Right;   FISTULOGRAM Right 03/29/2017   Procedure: FISTULOGRAM COMPLEX RIGHT ARM with Balloon angioplasty;  Surgeon: Waynetta Sandy, MD;  Location: [facility];  Service: Vascular;  Laterality: Right;   IR GENERIC HISTORICAL  05/19/2016   IR REMOVAL TUN CV CATH W/O FL 05/19/2016 Markus Daft, MD [facility]   PERIPHERAL VASCULAR BALLOON ANGIOPLASTY Right 04/17/2016   Procedure: Peripheral Vascular Balloon Angioplasty;  Surgeon: Waynetta Sandy, MD;  Location: [facility];  Service: Cardiovascular;  Laterality: Right;  Central segment AV Fistula   PERIPHERAL VASCULAR BALLOON ANGIOPLASTY Right 03/14/2018   Procedure: PERIPHERAL VASCULAR BALLOON ANGIOPLASTY;  Surgeon: Waynetta Sandy, MD;  Location: [facility];  Service: Cardiovascular;  Laterality: Right;  subclavian vein   PERIPHERAL VASCULAR INTERVENTION Right 03/14/2018   Procedure: PERIPHERAL VASCULAR INTERVENTION;  Surgeon: Waynetta Sandy, MD;  Location: [facility];  Service: Cardiovascular;  Laterality: Right;  subclavian vein   SHUNTOGRAM Right 10/19/2013   Procedure: FISTULOGRAM;  Surgeon: Conrad [place], MD;  Location: [facility];  Service: Cardiovascular;  Laterality: Right;   TONSILLECTOMY     VISCERAL VENOGRAPHY  Right 03/14/2018   Procedure: CENTRAL VENOGRAPHY;  Surgeon: Waynetta Sandy, MD;  Location: [facility];  Service: Cardiovascular;  Laterality: Right;  upper ext fistula     OB History    Gravida  1   Para  1   Term  1   Preterm      AB      Living  1     SAB      TAB      Ectopic      Multiple      Live Births              Family History  Problem Relation Age of Onset   Anesthesia problems Neg Hx    Hypotension Neg Hx    Malignant hyperthermia Neg Hx    Pseudochol deficiency Neg Hx     Social History   Tobacco Use   Smoking status: Former Smoker    Quit date: 03/12/1983    Years since quitting: 36.3   Smokeless tobacco: Never Used  Substance Use Topics   Alcohol use: No    Alcohol/week: 0.0 standard drinks   Drug use: No    Home Medications Prior to Admission medications   Medication Sig Start Date End  Date Taking? Authorizing Provider  acetaminophen (TYLENOL) 325 MG tablet Take 650 mg by mouth every 4 (four) hours as needed for mild pain or fever.    [provider]  alum & mag hydroxide-simeth (MAALOX/MYLANTA) 200-200-20 MG/5ML suspension Take 30 mLs by mouth every 2 (two) hours as needed for indigestion or heartburn. Max 4 doses per 24 hours    [provider]  Biotin 5000 MCG CAPS Take by mouth.    [provider]  bisacodyl (FLEET) 10 MG/30ML ENEM Place 10 mg rectally daily as needed (constipation).    [provider]  cinacalcet (SENSIPAR) 30 MG tablet Take 60 mg by mouth at bedtime.     [provider]  clopidogrel (PLAVIX) 75 MG tablet Take 1 tablet (75 mg total) by mouth daily. 03/29/17   Waynetta Sandy, MD  diltiazem (CARDIZEM) 60 MG tablet Take 60 mg by mouth 2 (two) times daily.    [provider]  dolutegravir (TIVICAY) 50 MG tablet Take 50 mg by mouth daily.    [provider]  fentaNYL (DURAGESIC - DOSED MCG/HR) 50 MCG/HR Apply one patch  every 72 hours for pain. Remove old patch. External use only. Rotate sites. Patient taking differently: Place 50 mcg onto the skin every 3 (three) days. Remove old patch. External use only. Rotate sites. 02/28/15   Lauree Chandler, NP  glucagon (GLUCAGON EMERGENCY) 1 MG injection Inject 1 mg into the vein once as needed (if bs is < 60 and pt is unconscious).    [provider]  ipratropium-albuterol (DUONEB) 0.5-2.5 (3) MG/3ML SOLN  04/10/18   [provider]  lamivudine (EPIVIR) 100 MG tablet Take 50 mg by mouth daily.    [provider]  Lidocaine HCl (ASPERCREME W/LIDOCAINE) 4 % CREA Apply topically 2 (two) times daily as needed.    [provider]  lidocaine-prilocaine (EMLA) cream Apply 1 application topically 3 (three) times a week. Apply 1 hour prior to dialysis access site on Monday Wednesday and Friday    [provider]  metoprolol (LOPRESSOR) 50 MG tablet Take 50 mg by mouth 2 (two) times daily.    [provider]  multivitamin (RENA-VIT) TABS tablet Take 1 tablet by mouth at bedtime.    [provider]  Nutritional Supplements (NOVASOURCE RENAL) LIQD Take 237 mLs by mouth 3 (three) times daily.    [provider]  ondansetron (ZOFRAN-ODT) 4 MG disintegrating tablet Take 4 mg by mouth every 4 (four) hours as needed for nausea or vomiting. If not resolved after 2 doses then take promethazine 25 mg    [provider]  Oxycodone HCl 10 MG TABS Take 10 mg by mouth every 12 (twelve) hours as needed (severe pain).    [provider]  pantoprazole (PROTONIX) 20 MG tablet Take 20 mg by mouth daily.    [provider]  promethazine (PHENERGAN) 12.5 MG tablet Take 12.5 mg by mouth every Monday, Wednesday, and Friday with hemodialysis. May take an additional 12.5 mg dose every 6 hours as needed for nausea    [provider]  sertraline (ZOLOFT) 25 MG tablet Take 25 mg by mouth daily. Take 25 mg  with the 100 mg.    [provider]  SERTRALINE HCL PO Take 100 mg by mouth daily.     [provider]  sevelamer carbonate (RENVELA) 800 MG tablet Take 2,400 mg by mouth 3 (three) times daily with meals.     [provider]  sildenafil (REVATIO) 20 MG tablet Take 20 mg by mouth 3 (three) times daily.     [provider]  vitamin B-12 (CYANOCOBALAMIN) 100 MCG tablet Take 100 mcg by mouth daily.    [provider]  vitamin C (ASCORBIC ACID) 500 MG tablet Take 500 mg by mouth 2 (two) times daily.    [provider]  zaleplon (SONATA) 5 MG capsule Take one capsule by mouth every night at bedtime for rest Patient taking differently: Take 5 mg by mouth at bedtime.  02/19/15   Lauree Chandler, NP  zidovudine (RETROVIR) 100 MG capsule Take 100 mg by mouth 3 (three) times daily.      [provider]    Allergies    Lactose, Lactose intolerance (gi), Other, Latex, and Penicillins  Review of Systems   Review of Systems  Constitutional: Positive for fatigue. Negative for appetite change, diaphoresis, fever and unexpected weight change.  HENT: Negative for mouth sores.   Eyes: Negative for visual disturbance.  Respiratory: Negative for cough, chest tightness, shortness of breath and wheezing.   Cardiovascular: Negative for chest pain.  Gastrointestinal: Positive for abdominal pain and diarrhea. Negative for constipation, nausea and vomiting.  Endocrine: Negative for polydipsia, polyphagia and polyuria.  Genitourinary: Negative for dysuria, frequency, hematuria and urgency.  Musculoskeletal: Negative for back pain and neck stiffness.  Skin: Negative for rash.  Allergic/Immunologic: Negative for immunocompromised state.  Neurological: Negative for syncope, light-headedness and headaches.  Hematological: Does not bruise/bleed easily.  Psychiatric/Behavioral: Negative for sleep disturbance. The patient is not nervous/anxious.      Physical Exam Updated Vital Signs BP (!) 150/99 (BP Location: Left Arm)    Pulse 62    Temp 98.2 F (36.8 C) (Oral)    Resp 18    Ht 5\' 2"  (1.575 m)    Wt 49.8 kg    SpO2 94%    BMI 20.08 kg/m   Physical Exam Vitals and nursing note reviewed.  Constitutional:      General: She is not in acute distress.    Appearance: She is not diaphoretic.  HENT:     Head: Normocephalic.  Eyes:     General: No scleral icterus.    Conjunctiva/sclera: Conjunctivae normal.  Cardiovascular:     Rate and Rhythm: Normal rate and regular rhythm.     Pulses: Normal pulses.          Radial pulses are 2+ on the right side and 2+ on the left side.  Pulmonary:     Effort: Pulmonary effort is normal. No tachypnea, accessory muscle usage, prolonged expiration, respiratory distress or retractions.     Breath sounds: No stridor.     Comments: Equal chest rise. No increased work of breathing. Abdominal:     General: There is no distension.     Palpations: Abdomen is soft.     Tenderness: There is abdominal tenderness in the epigastric area. There is guarding. There is no right CVA tenderness, left CVA tenderness or rebound.    Musculoskeletal:     Cervical back: Normal range of motion.     Comments: Moves all extremities equally and without difficulty. Right forearm with fistula with palpable thrill.  Old fistulas in the right upper and left upper arms without palpable thrill.  Skin:    General: Skin is warm and dry.     Capillary Refill: Capillary refill takes less than 2 seconds.  Neurological:     Mental Status: She is alert.  GCS: GCS eye subscore is 4. GCS verbal subscore is 5. GCS motor subscore is 6.     Comments: Speech is clear and goal oriented.  Psychiatric:        Mood and Affect: Mood normal.     ED Results / Procedures / Treatments   Labs (all labs ordered are listed, but only abnormal results are displayed) Labs Reviewed  CBC WITH DIFFERENTIAL/PLATELET - Abnormal; Notable  for the following components:      Result Value   RBC 2.57 (*)    Hemoglobin 8.9 (*)    HCT 29.6 (*)    MCV 115.2 (*)    MCH 34.6 (*)    Platelets 117 (*)    All other components within normal limits  COMPREHENSIVE METABOLIC PANEL - Abnormal; Notable for the following components:   Chloride 94 (*)    BUN 59 (*)    Creatinine, Ser 9.29 (*)    Albumin 3.4 (*)    GFR calc non Af Amer 4 (*)    GFR calc Af Amer 5 (*)    Anion gap 16 (*)    All other components within normal limits  SARS CORONAVIRUS 2 BY RT PCR (HOSPITAL ORDER, [facility])  LACTIC ACID, PLASMA    EKG None  Radiology CT ABDOMEN PELVIS W CONTRAST  Result Date: 07/19/2019 CLINICAL DATA:  Chronic pancytopenia, abdominal pain. Possible hernia EXAM: CT ABDOMEN AND PELVIS WITH CONTRAST TECHNIQUE: Multidetector CT imaging of the abdomen and pelvis was performed using the standard protocol following bolus administration of intravenous contrast. CONTRAST:  130mL OMNIPAQUE IOHEXOL 300 MG/ML  SOLN COMPARISON:  10/07/2011 FINDINGS: Lower chest: Cardiomegaly. Chronic skin thickening and edematous appearance of the visualized right breast, similar in appearance to prior CT 10/07/2011. Visualized lung bases are clear. Hepatobiliary: No focal liver lesion. Small layering hyperdensity within the gallbladder lumen likely reflecting a small stone. The common bile duct is dilated measuring 15 mm the level of the pancreatic head (previously measured 9 mm). No discrete stone within the common bile duct. Pancreas: The main pancreatic duct is dilated measuring up to 8 mm, which is new from prior. No discrete pancreatic lesion. No peripancreatic inflammatory changes. Spleen: Stable mild splenomegaly. Adrenals/Urinary Tract: Unremarkable adrenal glands. Atrophic kidneys with innumerable predominantly low-density lesions likely reflecting cystic disease of dialysis. Urinary bladder is largely decompressed, limiting its  evaluation. Stomach/Bowel: The bowel is well distended with oral contrast. There is prominent circumferential bowel wall thickening involving the cecum, ascending colon, and proximal transverse colon. Appendix not definitively visualized. Scattered sigmoid diverticulosis. No bowel obstruction. Stomach appears within normal limits. Vascular/Lymphatic: Aortoiliac atherosclerosis without aneurysm. No abdominopelvic lymphadenopathy is visualized. Reproductive: Status post hysterectomy. No adnexal masses. Other: No ascites. No abdominopelvic fluid collection. No pneumoperitoneum. Previously seen small 12 x 9 mm supraumbilical abdominal wall hernia is now hyperattenuating with stranding within the hernia sac (series 7, image 38). Musculoskeletal: Diffusely abnormal sclerotic appearance of the osseous structures compatible with chronic renal osteodystrophy. No acute fracture identified. IMPRESSION: 1. Prominent circumferential bowel wall thickening involving the cecum, ascending colon, and proximal transverse colon compatible with a infectious or inflammatory colitis. 2. Small 1.2 cm supraumbilical abdominal wall hernia previously containing only fat with new increased density/stranding within the hernia sac concerning for strangulation. 3. Interval dilation of the common bile duct and main pancreatic duct without definitive obstructive etiology. Further evaluation with MRI/MRCP is recommended. Non-emergent MRI should be deferred until patient has been discharged for the acute  illness, and can optimally cooperate with positioning and breath-holding instructions. 4. Chronic skin thickening and edematous changes to the visualized right breast, similar to prior CT 2013. 5. Additional chronic findings, as above. Electronically Signed   By: Davina Poke D.O.   On: 07/19/2019 15:19    Procedures Procedures (including critical care time)  Medications Ordered in ED Medications  iohexol (OMNIPAQUE) 9 MG/ML oral solution  500 mL (has no administration in time range)  metroNIDAZOLE (FLAGYL) IVPB 500 mg (has no administration in time range)  ceFEPIme (MAXIPIME) 2 g in sodium chloride 0.9 % 100 mL IVPB (has no administration in time range)  morphine 4 MG/ML injection 4 mg (4 mg Intravenous Given 07/19/19 1130)  iohexol (OMNIPAQUE) 300 MG/ML solution 100 mL (100 mLs Intravenous Contrast Given 07/19/19 1441)    ED Course  I have reviewed the triage vital signs and the nursing notes.  Pertinent labs & imaging results that were available during my care of the patient were reviewed by me and considered in my medical decision making (see chart for details).  Clinical Course as of Jul 19 1546  Wed Jul 19, 2019  1540 CT scan shows both infectious colitis of the cecum, ascending colon and proximal transverse colon along with likely strangulation of a supraumbilical abdominal wall hernia.  I have been unable to reduce this hernia therefore general surgery will be consulted.    CT ABDOMEN PELVIS W CONTRAST [HM]  1544 Discussed with Dr. Arnoldo Morale, general surgery who will assess the patient concerning her questionably strangulated hernia and potential need for surgical reduction.  Patient will need medical admission.   [HM]    Clinical Course User Index [HM] Tallulah Hosman, Gwenlyn Perking   MDM Rules/Calculators/A&P                       Patient presents with numerous chronic medical problems now with abdominal pain, weakness and diarrhea.  Palpable ventral hernia on reducible and tender to palpation.  Additional mass palpable to the right lower abdomen which is less tender but does not specifically feel like a hernia.  Will obtain lab work, CT scan and reassess.  Additional pain medication given.  3:45 PM CT scan with colitis and strangulation of a supraumbilical hernia.  Patient given IV antibiotics for intra-abdominal infection.  Discussed with general surgery about potential for surgical reduction of supraumbilical hernia as  it appears strangulated.  Dr. Arnoldo Morale will evaluate here in the emergency department.  Labs generally as expected as patient missed dialysis yesterday.  No hyperkalemia.  Normal lactic acid.  Patient afebrile here in the emergency department.  She will be admitted for IV antibiotics and additional management.  The patient was discussed with and evaluated by Dr. Vanita Panda who agrees with the treatment plan.  4:05 PM Discussed with [facility] hospitalist who will admit.  Final Clinical Impression(s) / ED Diagnoses Final diagnoses:  Colitis  Supraumbilical hernia  Common bile duct dilation  Diarrhea of presumed infectious origin  Anemia of chronic disease    Rx / DC Orders ED Discharge Orders    None       Loni Muse Gwenlyn Perking 07/19/19 1606    Carmin Muskrat, MD 07/19/19 2135

## 2019-07-19 NOTE — Assessment & Plan Note (Addendum)
1.  Pancytopenia: -CBC on 09/20/2018 showed white count 2.6, hemoglobin 9.4 and MCV 100, platelet count 128.  Differential showed 44% neutrophils. -Repeat CBC in our office on 12/08/2018 showed white count 3.4 with 60% neutrophils.  Hemoglobin improved to 10.8 and platelet count was normal at 172. -B12 was normal with elevated methylmalonic acid of 700.  I have given a B12 shot today in the office.  She is taking B12 1 mg tablet daily. -We have done infectious disease panel including CMV, EBV and parvo B19 and hepatitis panel which were negative.  EBV IgG antibody was positive but IgM was negative. -Labs done on 07/12/2019 showed hemoglobin 9.5, WBC 3.0, platelets 114, ferritin 1004, percent saturation 33, vitamin B12 level 3770. -She will follow-up in 4 months with repeat labs and B12 levels.  2.  HIV: -She is on Epivir 50 milligrams daily, Retrovir 100 mg 3 times a day and Tivicay 50 mg daily. -CD4 count and viral load are done periodically by infectious disease.  3. ESRD: -She undergoes hemodialysis on Mondays, Wednesdays and Fridays. -She missed her last dialysis appointment due to her not feeling well.  4.  Chronic pain: -She is on fentanyl 50 mcg and oxycodone 10 mg twice daily as needed.  5.  Vitamin D deficiency: -Labs done on 07/12/2019 showed vitamin D level 9.23 -We will start her on 50,000 units weekly.  6.  New acute onset abdominal pain/hernia: -Patient reports she has new onset abdominal pain in a small round mass on her abdomen that is new. -She has not felt good over the past few days and has severe abdominal pain.  It is very tender to the touch. -She reports she has not reported this to anyone. -We will send her to the ER to evaluate hernia/bowel strangulation.

## 2019-07-19 NOTE — ED Triage Notes (Signed)
PT was seen at the [facility] today for routine appt for f/u with her chronic pancytopenia. Randi, NP sent pt to ED today for evaluation of hernia to middle of abdomen and pt c/o pain with palpation x1 week.  PT states she is due for dialysis tomorrow and is from [facility] facility.

## 2019-07-20 ENCOUNTER — Ambulatory Visit (HOSPITAL_COMMUNITY): Payer: Medicare Other | Admitting: Nurse Practitioner

## 2019-07-20 ENCOUNTER — Encounter (HOSPITAL_COMMUNITY): Payer: Self-pay | Admitting: Internal Medicine

## 2019-07-20 DIAGNOSIS — J181 Lobar pneumonia, unspecified organism: Secondary | ICD-10-CM

## 2019-07-20 DIAGNOSIS — J9601 Acute respiratory failure with hypoxia: Secondary | ICD-10-CM

## 2019-07-20 LAB — BASIC METABOLIC PANEL
Anion gap: 15 (ref 5–15)
BUN: 64 mg/dL — ABNORMAL HIGH (ref 8–23)
CO2: 26 mmol/L (ref 22–32)
Calcium: 9.1 mg/dL (ref 8.9–10.3)
Chloride: 95 mmol/L — ABNORMAL LOW (ref 98–111)
Creatinine, Ser: 9.7 mg/dL — ABNORMAL HIGH (ref 0.44–1.00)
GFR calc Af Amer: 4 mL/min — ABNORMAL LOW (ref >60)
GFR calc non Af Amer: 4 mL/min — ABNORMAL LOW (ref >60)
Glucose, Bld: 80 mg/dL (ref 70–99)
Potassium: 4.8 mmol/L (ref 3.5–5.1)
Sodium: 136 mmol/L (ref 135–145)

## 2019-07-20 LAB — CBC
HCT: 27 % — ABNORMAL LOW (ref 36.0–46.0)
Hemoglobin: 8 g/dL — ABNORMAL LOW (ref 12.0–15.0)
MCH: 33.9 pg (ref 26.0–34.0)
MCHC: 29.6 g/dL — ABNORMAL LOW (ref 30.0–36.0)
MCV: 114.4 fL — ABNORMAL HIGH (ref 80.0–100.0)
Platelets: 105 10*3/uL — ABNORMAL LOW (ref 150–400)
RBC: 2.36 MIL/uL — ABNORMAL LOW (ref 3.87–5.11)
RDW: 13.8 % (ref 11.5–15.5)
WBC: 3.2 10*3/uL — ABNORMAL LOW (ref 4.0–10.5)
nRBC: 0 % (ref 0.0–0.2)

## 2019-07-20 LAB — GLUCOSE, CAPILLARY
Glucose-Capillary: 74 mg/dL (ref 70–99)
Glucose-Capillary: 72 mg/dL (ref 70–99)

## 2019-07-20 LAB — T-HELPER CELLS (CD4) COUNT (NOT AT ARMC)
CD4 % Helper T Cell: 22 % — ABNORMAL LOW (ref 33–65)
CD4 T Cell Abs: 169 /uL — ABNORMAL LOW (ref 400–1790)

## 2019-07-20 LAB — HEPATITIS B SURFACE ANTIGEN: Hepatitis B Surface Ag: NONREACTIVE

## 2019-07-20 LAB — MRSA PCR SCREENING: MRSA by PCR: NEGATIVE

## 2019-07-20 MED ORDER — CHLORHEXIDINE GLUCONATE CLOTH 2 % EX PADS
6.0000 | MEDICATED_PAD | Freq: Every day | CUTANEOUS | Status: DC
  Administered 2019-07-21 – 2019-07-22 (×2): 6 via TOPICAL

## 2019-07-20 MED ORDER — HEPARIN SODIUM (PORCINE) 1000 UNIT/ML DIALYSIS: 20.0000 [IU]/kg | INTRAMUSCULAR | Status: DC | PRN

## 2019-07-20 MED ORDER — SODIUM CHLORIDE 0.9 % IV SOLN: 100.0000 mL | INTRAVENOUS | Status: DC | PRN

## 2019-07-20 MED ORDER — CALCIUM ACETATE (PHOS BINDER) 667 MG PO CAPS
2001.0000 mg | ORAL_CAPSULE | Freq: Three times a day (TID) | ORAL | Status: DC
  Administered 2019-07-20 – 2019-07-22 (×8): 2001 mg via ORAL
  Filled 2019-07-20 (×9): qty 3

## 2019-07-20 MED ORDER — LIDOCAINE HCL (PF) 1 % IJ SOLN: 5.0000 mL | INTRAMUSCULAR | Status: DC | PRN

## 2019-07-20 MED ORDER — SODIUM CHLORIDE 0.9 % IV SOLN
500.0000 mg | Freq: Once | INTRAVENOUS | Status: AC
  Administered 2019-07-20: 500 mg via INTRAVENOUS
  Filled 2019-07-20: qty 0.5

## 2019-07-20 MED ORDER — LIDOCAINE-PRILOCAINE 2.5-2.5 % EX CREA: 1.0000 "application " | TOPICAL_CREAM | CUTANEOUS | Status: DC | PRN

## 2019-07-20 MED ORDER — PENTAFLUOROPROP-TETRAFLUOROETH EX AERO
1.0000 "application " | INHALATION_SPRAY | CUTANEOUS | Status: DC | PRN
  Filled 2019-07-20: qty 116

## 2019-07-20 MED ORDER — SULFAMETHOXAZOLE-TRIMETHOPRIM 800-160 MG PO TABS
1.0000 | ORAL_TABLET | ORAL | Status: DC
  Administered 2019-07-20 – 2019-07-22 (×2): 1 via ORAL
  Filled 2019-07-20 (×2): qty 1

## 2019-07-20 MED ORDER — SODIUM CHLORIDE 0.9 % IV SOLN
500.0000 mg | INTRAVENOUS | Status: DC
  Administered 2019-07-20 – 2019-07-21 (×2): 500 mg via INTRAVENOUS
  Filled 2019-07-20 (×4): qty 0.5

## 2019-07-20 MED ORDER — METOPROLOL TARTRATE 25 MG PO TABS
12.5000 mg | ORAL_TABLET | Freq: Two times a day (BID) | ORAL | Status: DC
  Administered 2019-07-20 (×2): 12.5 mg via ORAL
  Filled 2019-07-20 (×3): qty 1

## 2019-07-20 NOTE — Consult Note (Addendum)
Referring Provider: Orson Eva, MD Primary Care Physician:  Hilbert Corrigan, MD Primary Gastroenterologist:  Dr. Wilfrid Lund III Reason for Consultation:  colitis  HPI: [name] is a 69 y.o. female history of pancytopenia followed by hematology, chronic pain, HIV, TIA, pulmonary hypertension, hypertension, end-stage renal disease on hemodialysis, a flutter presented to the ED yesterday from the cancer center for 1 week history of abdominal pain at site of abdominal hernia.  On presentation she was noted to have bibasilar pneumonia (left greater than right) with associated hypoxia. Hemoglobin 8.9, hematocrit 29.6, MCV 115.2, platelets 117,000, white blood cell count 4400.  Creatinine 9.29, BUN 59, Covid negative.  C. difficile quick screen and Gi panel ordered but not collected. CT neck completed as outlined below.   CT A/P with contrast showing cholelithiasis, and CBD diameter 15 mm, pancreatic duct dilated to 8 mm, new from 2013 CT.  Stable mild splenomegaly.  Prominent circumferential bowel wall thickening involving the cecum, ascending colon, proximal transverse colon.  Small 12 x 9 mm supraumbilical abdominal wall hernia now hyperattenuating with stranding within the hernia sac.  MRI/MRCP recommended for interval dilation of the CBD and main pancreatic duct.  Patient reports one week history of abdominal pain at two different sites on her abdomen. Bulging above umbilicus and in ventral area in epigastrium. She complains of diarrhea off/on but pretty bad over the weekend and through Tuesday. No BM in 48 hours. Missed dialysis Tuesday for it. No vomiting. No significant heartburn. No dysphagia except to large pills. Does not look for melena, brbpr. Complains of nocturnal diarrhea. May have 4-5 stools per day. Breathing is improved. No prior colonoscopy that she can remember, at least not recently.  Since admission she has received two doses of Maxipime, one dose Merrem, two doses of  IV flagyl. Currently only IV zithromax.     Previous work-up by primary gastroenterologist, Dr. Loletha Carrow: Upper GI series May 2019 with age-related esophageal dysmotility, mild gastroesophageal reflux, thickening of rugal folds of the gastric fundus and proximal gastric body, can be seen with gastritis and with infiltrative processes including tumor.  Today her white blood cell count 7200, hemoglobin 8, platelets 105,000.       Prior to Admission medications   Medication Sig Start Date End Date Taking? Authorizing Provider  Biotin 5000 MCG CAPS Take 1 capsule by mouth daily.    Yes [provider]  calcium acetate, Phos Binder, (PHOSLYRA) 667 MG/5ML SOLN Take 2,001 mg by mouth 3 (three) times daily with meals.   Yes [provider]  calcium carbonate (TUMS - DOSED IN MG ELEMENTAL CALCIUM) 500 MG chewable tablet Chew 2 tablets by mouth in the morning and at bedtime.   Yes [provider]  clopidogrel (PLAVIX) 75 MG tablet Take 1 tablet (75 mg total) by mouth daily. 03/29/17  Yes Waynetta Sandy, MD  diltiazem (CARDIZEM) 60 MG tablet Take 60 mg by mouth 2 (two) times daily.   Yes [provider]  dolutegravir (TIVICAY) 50 MG tablet Take 50 mg by mouth daily.   Yes [provider]  fentaNYL (DURAGESIC - DOSED MCG/HR) 50 MCG/HR Apply one patch every 72 hours for pain. Remove old patch. External use only. Rotate sites. Patient taking differently: Place 50 mcg onto the skin every 3 (three) days. Remove old patch. External use only. Rotate sites. 02/28/15  Yes Lauree Chandler, NP  lamivudine (EPIVIR) 100 MG tablet Take 50 mg by mouth daily.   Yes [provider]  Lidocaine HCl (ASPERCREME W/LIDOCAINE) 4 % CREA Apply 1 application topically 2 (two) times daily as needed (to bilateral wrists for pain).    Yes [provider]  lidocaine-prilocaine (EMLA) cream Apply 1 application topically Every Tuesday,Thursday,and Saturday with  dialysis. 1 hour prior to dialysis   Yes [provider]  metoprolol (LOPRESSOR) 50 MG tablet Take 50 mg by mouth 2 (two) times daily.   Yes [provider]  mirtazapine (REMERON) 15 MG tablet Take 15 mg by mouth at bedtime.   Yes [provider]  multivitamin (RENA-VIT) TABS tablet Take 1 tablet by mouth at bedtime.   Yes [provider]  omeprazole (PRILOSEC) 40 MG capsule Take 40 mg by mouth in the morning.   Yes [provider]  Oxycodone HCl 10 MG TABS Take 10 mg by mouth every 12 (twelve) hours as needed (severe pain).   Yes [provider]  promethazine (PHENERGAN) 12.5 MG tablet Take 12.5 mg by mouth every Monday, Wednesday, and Friday with hemodialysis. May take an additional 12.5 mg dose every 6 hours as needed for nausea   Yes [provider]  promethazine (PHENERGAN) 6.25 MG/5ML syrup Place 12.5 mg into feeding tube daily as needed for nausea or vomiting.   Yes [provider]  sertraline (ZOLOFT) 100 MG tablet Take 100 mg by mouth at bedtime. 100mg  with 25mg  at bedtime   Yes [provider]  sertraline (ZOLOFT) 25 MG tablet Take 25 mg by mouth at bedtime. Take 25 mg with the 100 mg.    Yes [provider]  traZODone (DESYREL) 50 MG tablet Take 50 mg by mouth at bedtime.   Yes [provider]  vitamin B-12 (CYANOCOBALAMIN) 100 MCG tablet Take 100 mcg by mouth daily.   Yes [provider]  vitamin C (ASCORBIC ACID) 500 MG tablet Take 500 mg by mouth 2 (two) times daily.   Yes [provider]  zidovudine (RETROVIR) 100 MG capsule Take 100 mg by mouth 3 (three) times daily.     Yes [provider]    Current Facility-Administered Medications  Medication Dose Route Frequency Provider Last Rate Last Admin  . acetaminophen (TYLENOL) tablet 650 mg  650 mg Oral Q6H PRN Emokpae, Ejiroghene E, MD       Or  . acetaminophen (TYLENOL) suppository 650 mg  650 mg Rectal Q6H  PRN Emokpae, Ejiroghene E, MD      . azithromycin (ZITHROMAX) 500 mg in sodium chloride 0.9 % 250 mL IVPB  500 mg Intravenous Q24H Emokpae, Ejiroghene E, MD 250 mL/hr at 07/19/19 2333 500 mg at 07/19/19 2333  . calcium acetate (PHOSLO) capsule 2,001 mg  2,001 mg Oral TID WC Orson Eva, MD   2,001 mg at 07/20/19 0944  . Chlorhexidine Gluconate Cloth 2 % PADS 6 each  6 each Topical Daily Emokpae, Ejiroghene E, MD      . dolutegravir (TIVICAY) tablet 50 mg  50 mg Oral Daily Emokpae, Ejiroghene E, MD   50 mg at 07/20/19 0945  . fentaNYL (DURAGESIC) 50 MCG/HR 1 patch  1 patch Transdermal Q72H Emokpae, Ejiroghene E, MD   1 patch at 07/19/19 2341  . heparin injection 5,000 Units  5,000 Units Subcutaneous Q8H Emokpae, Ejiroghene E, MD   5,000 Units at 07/20/19 0612  . lamiVUDine (EPIVIR) 10 MG/ML solution 50 mg  50 mg Oral Daily Emokpae, Ejiroghene E, MD      . meropenem (MERREM) 500 mg in sodium chloride 0.9 %  100 mL IVPB  500 mg Intravenous Once Tat, Shanon Brow, MD 200 mL/hr at 07/20/19 0955 500 mg at 07/20/19 0955  . metoprolol tartrate (LOPRESSOR) tablet 12.5 mg  12.5 mg Oral BID Tat, David, MD      . mirtazapine (REMERON) tablet 15 mg  15 mg Oral QHS Emokpae, Ejiroghene E, MD   15 mg at 07/19/19 2334  . multivitamin (RENA-VIT) tablet 1 tablet  1 tablet Oral QHS Emokpae, Ejiroghene E, MD   1 tablet at 07/19/19 2334  . ondansetron (ZOFRAN) tablet 4 mg  4 mg Oral Q6H PRN Emokpae, Ejiroghene E, MD       Or  . ondansetron (ZOFRAN) injection 4 mg  4 mg Intravenous Q6H PRN Emokpae, Ejiroghene E, MD      . oxyCODONE (Oxy IR/ROXICODONE) immediate release tablet 10 mg  10 mg Oral Q12H PRN Emokpae, Ejiroghene E, MD      . polyethylene glycol (MIRALAX / GLYCOLAX) packet 17 g  17 g Oral Daily PRN Emokpae, Ejiroghene E, MD      . sertraline (ZOLOFT) tablet 125 mg  125 mg Oral QHS Emokpae, Ejiroghene E, MD   125 mg at 07/19/19 2334  . traZODone (DESYREL) tablet 50 mg  50 mg Oral QHS Emokpae, Ejiroghene E, MD   50 mg at  07/19/19 2352  . zidovudine (RETROVIR) capsule 100 mg  100 mg Oral TID Emokpae, Ejiroghene E, MD   100 mg at 07/19/19 2335    Allergies as of 07/19/2019 - Review Complete 07/19/2019  Allergen Reaction Noted  . Lactose  07/19/2019  . Lactose intolerance (gi) Other (See Comments) 03/29/2017  . Other  07/19/2019  . Latex Rash and Itching 03/09/2011  . Penicillins Rash and Swelling 04/02/2011    Past Medical History:  Diagnosis Date  . Anemia   . Anxiety   . Atrial flutter ([place]) 02/22/2015  . C. difficile colitis 10/10/11  . Chronic diarrhea   . Depression   . ESRD on hemodialysis ([place])   . GERD (gastroesophageal reflux disease)   . HIV (human immunodeficiency virus infection) ([place])   . Hypertension   . Insomnia   . Intracranial hemorrhage ([place])   . Muscle weakness (generalized)   . Pulmonary HTN ([place])   . Tachycardia   . TIA (transient ischemic attack)   . Traumatic hematoma of right upper arm 02/22/2015    Past Surgical History:  Procedure Laterality Date  . A/V FISTULAGRAM N/A 04/17/2016   Procedure: A/V Fistulagram - Right Upper;  Surgeon: Waynetta Sandy, MD;  Location: [facility];  Service: Cardiovascular;  Laterality: N/A;  . BIOPSY THYROID    . Dialysis Shunts     previous one removed from left arm and now present one in right arm  . DIALYSIS/PERMA CATHETER INSERTION Right 04/17/2016   Procedure: dialysis Catheter Insertion central veinous;  Surgeon: Waynetta Sandy, MD;  Location: [facility];  Service: Cardiovascular;  Laterality: Right;  . ESOPHAGOGASTRODUODENOSCOPY  12/15/2010   [place]: erosive reflux esophagitis, MW tear, hiatal hernia (small), gastritis with no h.pylori, possibly nsaid related  . EXCISION OF BREAST BIOPSY Right 05/04/2012   Procedure: EXCISION OF BREAST BIOPSY;  Surgeon: Donato Heinz, MD;  Location: [facility];  Service: General;  Laterality: Right;  Right Excisional Breast Biopsy  . FISTULOGRAM Right 03/26/2014    Procedure: Right Arm Fistulogram with Venoplasty Right Subclavian Vein and Inominate Vein. Debridement Fistula Ulcer;  Surgeon: Conrad Adin, MD;  Location: [place] [facility];  Service: Vascular;  Laterality: Right;  . FISTULOGRAM Right 03/29/2017   Procedure: FISTULOGRAM COMPLEX RIGHT ARM with Balloon angioplasty;  Surgeon: Waynetta Sandy, MD;  Location: [facility];  Service: Vascular;  Laterality: Right;  . IR GENERIC HISTORICAL  05/19/2016   IR REMOVAL TUN CV CATH W/O FL 05/19/2016 Markus Daft, MD [facility]  . PERIPHERAL VASCULAR BALLOON ANGIOPLASTY Right 04/17/2016   Procedure: Peripheral Vascular Balloon Angioplasty;  Surgeon: Waynetta Sandy, MD;  Location: [facility];  Service: Cardiovascular;  Laterality: Right;  Central segment AV Fistula  . PERIPHERAL VASCULAR BALLOON ANGIOPLASTY Right 03/14/2018   Procedure: PERIPHERAL VASCULAR BALLOON ANGIOPLASTY;  Surgeon: Waynetta Sandy, MD;  Location: [place] [facility] [facility];  Service: Cardiovascular;  Laterality: Right;  subclavian vein  . PERIPHERAL VASCULAR INTERVENTION Right 03/14/2018   Procedure: PERIPHERAL VASCULAR INTERVENTION;  Surgeon: Waynetta Sandy, MD;  Location: [facility];  Service: Cardiovascular;  Laterality: Right;  subclavian vein  . SHUNTOGRAM Right 10/19/2013   Procedure: FISTULOGRAM;  Surgeon: Conrad Mill Creek, MD;  Location: [facility];  Service: Cardiovascular;  Laterality: Right;  . TONSILLECTOMY    . VISCERAL VENOGRAPHY Right 03/14/2018   Procedure: CENTRAL VENOGRAPHY;  Surgeon: Waynetta Sandy, MD;  Location: [place] [facility];  Service: Cardiovascular;  Laterality: Right;  upper ext fistula    Family History  Problem Relation Age of Onset  . Anesthesia problems Neg Hx   . Hypotension Neg Hx   . Malignant hyperthermia Neg Hx   . Pseudochol deficiency Neg Hx     Social History   Socioeconomic History  . Marital status: Widowed    Spouse name: Not on file  . Number of  children: 1  . Years of education: Not on file  . Highest education level: Not on file  Occupational History  . Not on file  Tobacco Use  . Smoking status: Former Smoker    Quit date: 03/12/1983    Years since quitting: 36.3  . Smokeless tobacco: Never Used  Substance and Sexual Activity  . Alcohol use: No    Alcohol/week: 0.0 standard drinks  . Drug use: No  . Sexual activity: Not Currently  Other Topics Concern  . Not on file  Social History Narrative  . Not on file   Social Determinants of Health   Financial Resource Strain:   . Difficulty of Paying Living Expenses:   Food Insecurity:   . Worried About Charity fundraiser in the Last Year:   . Arboriculturist in the Last Year:   Transportation Needs:   . Film/video editor (Medical):   Marland Kitchen Lack of Transportation (Non-Medical):   Physical Activity:   . Days of Exercise per Week:   . Minutes of Exercise per Session:   Stress:   . Feeling of Stress :   Social Connections:   . Frequency of Communication with Friends and Family:   . Frequency of Social Gatherings with Friends and Family:   . Attends Religious Services:   . Active Member of Clubs or Organizations:   . Attends Archivist Meetings:   Marland Kitchen Marital Status:   Intimate Partner Violence:   . Fear of Current or Ex-Partner:   . Emotionally Abused:   Marland Kitchen Physically Abused:   . Sexually Abused:      ROS:  General: Negative for anorexia, weight loss, fever, chills, fatigue, weakness. Eyes: Negative for vision changes.  ENT: Negative for hoarseness, difficulty swallowing ,  nasal congestion. CV: Negative for chest pain, angina, palpitations, dyspnea on exertion, peripheral edema.  Respiratory: Negative for dyspnea at rest, dyspnea on exertion, cough, sputum, wheezing.  GI: See history of present illness. GU:  Negative for dysuria, hematuria, urinary incontinence, urinary frequency, nocturnal urination.  MS: chronic pain Derm: Negative for rash or  itching.  Neuro: Negative for weakness, abnormal sensation, seizure, frequent headaches, memory loss, confusion.  Psych: Negative for anxiety, depression, suicidal ideation, hallucinations.  Endo: Negative for unusual weight change.  Heme: Negative for bruising or bleeding. Allergy: Negative for rash or hives.       Physical Examination: Vital signs in last 24 hours: Temp:  [97.8 F (36.6 C)-98.2 F (36.8 C)] 98 F (36.7 C) (05/20 0511) Pulse Rate:  [46-62] 54 (05/20 0945) Resp:  [13-19] 16 (05/20 0511) BP: (109-150)/(58-99) 109/62 (05/20 0945) SpO2:  [88 %-100 %] 100 % (05/20 0511) Weight:  [49.8 kg] 49.8 kg (05/19 1028) Last BM Date: 07/18/19  General: Well-nourished, well-developed in no acute distress. Resting. Easily awakens. Somewhat poor historian.  Head: Normocephalic, atraumatic.   Eyes: Conjunctiva pink, no icterus. Mouth: Oropharyngeal mucosa moist and pink , no lesions erythema or exudate. Neck: Supple without thyromegaly, masses, or lymphadenopathy.  Lungs: Clear to auscultation bilaterally.  Heart: Regular rate and rhythm, no murmurs rubs or gallops.  Abdomen: Bowel sounds are normal, nondistended, no hepatosplenomegaly or masses, no abdominal bruits, no rebound or guarding.  Tender in mid abd at site of supraumbilical hernia and midline halfway between umbilical and sternum. Rectal: not performed Extremities: No lower extremity edema, clubbing, deformity.  Neuro: Alert and oriented x 4 , grossly normal neurologically.  Skin: Warm and dry, no rash or jaundice.   Psych: Alert and cooperative, normal mood and affect.        Intake/Output from previous day: 05/19 0701 - 05/20 0700 In: 763.6 [I.V.:216.5; IV Piggyback:547.1] Out: -  Intake/Output this shift: No intake/output data recorded.  Lab Results: CBC Recent Labs    07/19/19 1104 07/20/19 0447  WBC 4.4 3.2*  HGB 8.9* 8.0*  HCT 29.6* 27.0*  MCV 115.2* 114.4*  PLT 117* 105*   BMET Recent Labs     07/19/19 1104 07/20/19 0447  NA 137 136  K 4.6 4.8  CL 94* 95*  CO2 27 26  GLUCOSE 87 80  BUN 59* 64*  CREATININE 9.29* 9.70*  CALCIUM 9.1 9.1   LFT Recent Labs    07/19/19 1104  BILITOT 0.6  ALKPHOS 114  AST 15  ALT 12  PROT 7.9  ALBUMIN 3.4*   Lab Results  Component Value Date   LIPASE 38 10/07/2011     Lab Results  Component Value Date   VITAMINB12 3,770 (H) 07/12/2019   Lab Results  Component Value Date   FOLATE 66.3 07/12/2019   Lab Results  Component Value Date   IRON 52 07/12/2019   TIBC 157 (L) 07/12/2019   FERRITIN 1,004 (H) 07/12/2019    Lipase No results for input(s): LIPASE in the last 72 hours.  PT/INR No results for input(s): LABPROT, INR in the last 72 hours.    Imaging Studies: CT SOFT TISSUE NECK W CONTRAST  Result Date: 07/19/2019 CLINICAL DATA:  Initial evaluation for shortness of breath, difficulty with breathing. EXAM: CT NECK WITH CONTRAST TECHNIQUE: Multidetector CT imaging of the neck was performed using the standard protocol following the bolus administration of intravenous contrast. CONTRAST:  63mL OMNIPAQUE IOHEXOL 300 MG/ML  SOLN COMPARISON:  Comparison made with  prior neck CT from 08/24/2002. FINDINGS: Pharynx and larynx: Examination somewhat technically limited by patient positioning. Oral cavity within normal limits without discrete mass or collection. Patient is edentulous. No base of tongue lesion. Oropharynx within normal limits. No evidence for tonsillitis or acute peritonsillar abscess. Mild asymmetric fullness at the right nasopharynx favored to be related to positioning. No discrete mass identified. No retropharyngeal collection or swelling. Epiglottis within normal limits. Vallecula clear. Remainder of the hypopharynx and supraglottic larynx within normal limits. True cords fairly symmetric and normal. Subglottic airway largely clear. Salivary glands: Salivary glands including the parotid and submandibular glands are within  normal limits. Thyroid: Few subcentimeter dystrophic calcifications noted within the thyroid. Thyroid otherwise unremarkable. Lymph nodes: No pathologically enlarged lymph nodes seen within the neck. Mildly prominent 6 mm left level V node with surrounding inflammatory stranding noted (series 6, image 54), indeterminate, but could be reactive in nature. Vascular: Moderate atherosclerotic change present about the aortic arch, carotid bifurcations, and carotid siphons. Diffuse tortuosity the major arterial vasculature of the neck suggesting chronic underlying hypertension. Both carotid artery systems partially medialized into the retropharyngeal space. Vascular stent in place within the distal right subclavian vein as it dumps into the SVC. Patent flow seen through the stent. 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 Limited intracranial: Unremarkable. Visualized orbits: Globes and orbital soft tissues within normal limits. Mastoids and visualized paranasal sinuses: Visualized paranasal sinuses are clear. Visualized mastoids and middle ear cavities are well pneumatized and free of fluid. Skeleton: No acute osseous abnormality. Bones are diffusely sclerotic with somewhat permeative appearance, consistent with underlying renal osteodystrophy. Cervicothoracic scoliotic curvature with underlying moderate multilevel degenerative spondylosis, most pronounced at C4-5 through C6-7. At [facility] axial fusion noted. Prominent degenerative changes noted about the TMJs bilaterally. Upper chest: Hazy fat stranding within the visualized right chest wall and axillary region favored to be related to vascular congestion. Visualized upper chest demonstrates no other acute abnormality. Partially visualized lungs are largely clear. Mild paraseptal emphysema. Other: None. IMPRESSION: 1. Mildly prominent 6 mm left level V node with surrounding inflammatory  stranding, indeterminate, but could be reactive in nature. 2. No other acute abnormality within the neck. 3. Diffuse tortuosity the major arterial vasculature of the neck suggesting chronic underlying hypertension. 4. Vascular stent in place within the distal right subclavian vein with patent flow. Multiple prominent venous collaterals throughout the neck and upper chest wall. 5. Cervicothoracic scoliotic curvature with underlying moderate multilevel degenerative spondylosis, most pronounced at C4-5 through C6-7. 6. Diffuse sclerosis with somewhat permeative appearance of the bones, most likely related to underlying renal osteodystrophy. 7. Aortic Atherosclerosis (ICD10-I70.0) and Emphysema (ICD10-J43.9). Electronically Signed   By: Jeannine Boga M.D.   On: 07/19/2019 21:19   CT ABDOMEN PELVIS W CONTRAST  Result Date: 07/19/2019 CLINICAL DATA:  Chronic pancytopenia, abdominal pain. Possible hernia EXAM: CT ABDOMEN AND PELVIS WITH CONTRAST TECHNIQUE: Multidetector CT imaging of the abdomen and pelvis was performed using the standard protocol following bolus administration of intravenous contrast. CONTRAST:  135mL OMNIPAQUE IOHEXOL 300 MG/ML  SOLN COMPARISON:  10/07/2011 FINDINGS: Lower chest: Cardiomegaly. Chronic skin thickening and edematous appearance of the visualized right breast, similar in appearance to prior CT 10/07/2011. Visualized lung bases are clear. Hepatobiliary: No focal liver lesion. Small layering hyperdensity within the gallbladder lumen likely reflecting a small stone. The common bile duct is dilated measuring 15 mm the level of the pancreatic head (previously measured 9 mm). No discrete  stone within the common bile duct. Pancreas: The main pancreatic duct is dilated measuring up to 8 mm, which is new from prior. No discrete pancreatic lesion. No peripancreatic inflammatory changes. Spleen: Stable mild splenomegaly. Adrenals/Urinary Tract: Unremarkable adrenal glands. Atrophic kidneys  with innumerable predominantly low-density lesions likely reflecting cystic disease of dialysis. Urinary bladder is largely decompressed, limiting its evaluation. Stomach/Bowel: The bowel is well distended with oral contrast. There is prominent circumferential bowel wall thickening involving the cecum, ascending colon, and proximal transverse colon. Appendix not definitively visualized. Scattered sigmoid diverticulosis. No bowel obstruction. Stomach appears within normal limits. Vascular/Lymphatic: Aortoiliac atherosclerosis without aneurysm. No abdominopelvic lymphadenopathy is visualized. Reproductive: Status post hysterectomy. No adnexal masses. Other: No ascites. No abdominopelvic fluid collection. No pneumoperitoneum. Previously seen small 12 x 9 mm supraumbilical abdominal wall hernia is now hyperattenuating with stranding within the hernia sac (series 7, image 38). Musculoskeletal: Diffusely abnormal sclerotic appearance of the osseous structures compatible with chronic renal osteodystrophy. No acute fracture identified. IMPRESSION: 1. Prominent circumferential bowel wall thickening involving the cecum, ascending colon, and proximal transverse colon compatible with a infectious or inflammatory colitis. 2. Small 1.2 cm supraumbilical abdominal wall hernia previously containing only fat with new increased density/stranding within the hernia sac concerning for strangulation. 3. Interval dilation of the common bile duct and main pancreatic duct without definitive obstructive etiology. Further evaluation with MRI/MRCP is recommended. Non-emergent MRI should be deferred until patient has been discharged for the acute illness, and can optimally cooperate with positioning and breath-holding instructions. 4. Chronic skin thickening and edematous changes to the visualized right breast, similar to prior CT 2013. 5. Additional chronic findings, as above. Electronically Signed   By: Davina Poke D.O.   On: 07/19/2019  15:19   DG CHEST PORT 1 VIEW  Result Date: 07/19/2019 CLINICAL DATA:  69 year old with shortness of breath. Current history of HIV, hypertension, pulmonary hypertension and atrial flutter. Former smoker. Negative COVID-19 test earlier today. EXAM: PORTABLE CHEST 1 VIEW COMPARISON:  07/23/2017. FINDINGS: Cardiac silhouette moderately enlarged, unchanged. Thoracic aorta atherosclerotic, unchanged. Massive enlargement of the central pulmonary arteries, even more so than on the examination 2 years ago. Patchy airspace opacities with air bronchograms medially in the LEFT lung base and to a lesser degree medially in the RIGHT lung base. Lungs otherwise clear. Pulmonary vascularity normal without evidence of pulmonary edema. No visible pleural effusions. RIGHT subclavian vein stent. Surgical clips in the low RIGHT axilla/axillary tail of the RIGHT breast. IMPRESSION: 1. Bibasilar pneumonia, LEFT greater than RIGHT. 2. Stable cardiomegaly without evidence of pulmonary edema. 3. Massive enlargement of the central pulmonary arteries indicating pulmonary arterial hypertension, with interval increase in size of the pulmonary arteries since the examination 2 years ago. Electronically Signed   By: Evangeline Dakin M.D.   On: 07/19/2019 20:57  [4 week]   Impression: Pleasant 69 y/o female with multiple comorbidities as outlined above, sent to ED from hematology appointment yesterday with one week history of abdominal pain at site of hernia. Patient with recent acute on chronic diarrhea as well. Diagnosed with bibasilar pneumonia with hypoxia not on antibiotic therapy.   Abdominal pain: CT with prominent circumferential bowel wall thickening involving the cecum, ascending colon, proximal colon as well as 12 x 9 mm supraumbilical abdominal wall hernia with stranding but does not contain bowel.  Evaluated by surgery who did not advise surgical intervention at this time.  Patient also had evidence of cholelithiasis with  dilated common bile duct of 15 mm, pancreatic  duct dilated at 8 mm without identifiable cause. LFTs normal.  Acute on chronic diarrhea: Colitis noted on current CT.  Patient denies recent antibiotic use but she is somewhat of a poor historian so question reliability.  No prior colonoscopy that she can remember.  Some days has 4-5 stools daily.  Complains of nocturnal diarrhea.  She is unsure if she has any blood in the stool reporting that she does not look at it.  Stool studies have been ordered but she has had no bowel movement in the past 48 hours therefore they have not been completed.  Anemia in setting of pancytopenia and ESRD.  No evidence of IDA on anemia profile. No prior colonoscopy. Remote EGD in 2012. No overt GI bleeding.   Plan: 1. Consider MRCP (no contrast) to further evaluate common bile duct as an outpatient.  May need CT pancreatic protocol to further evaluate pancreatic duct dilation as opposed to MRI with contrast due to her ESRD. This can be done as outpatient.  2. Agree with stool studies if recurrent diarrhea. She needs a colonoscopy timing based on clinical course. If improves, would elect for outpatient colonoscopy in 6-8 weeks. If persisting diarrhea, may need inpatient colonoscopy to assess now. Given HIV, low CD4 count, need to consider opportunistic infection.   We would like to thank you for the opportunity to participate in the care of LINA HITCH.  Laureen Ochs. Bernarda Caffey [facility] [phone] 5/20/20211:20 PM     LOS: 1 day

## 2019-07-20 NOTE — Progress Notes (Signed)
PROGRESS NOTE  [name] XYB:[id number] DOB: 24-Aug-1950 DOA: 07/19/2019 PCP: Hilbert Corrigan, MD  Brief History:  69 year old female with a history of HIV, atrial fibrillation, ESRD (TTS), hypertension, TIA, thrombocytopenia presenting with 1 week history of abdominal pain, loose stools, and fevers and chills.  The patient is also been complaining of shortness of breath of unclear duration.  She is a difficult historian at best.  She denies any hematochezia or melena.  She has not had any vomiting but has had some nausea.  She is not able to clarify exacerbating or alleviating factors frequent with regards to her shortness of breath.  She denies any coughing or hemoptysis.  She is a resident of [facility].  The patient states that she has had 3-4 loose bowel movements on a daily basis but has not had any hematochezia or melena.  She describes her pain as moderate that is periumbilical and left lower quadrant area. In the emergency department, the patient was afebrile hemodynamically stable with oxygen saturation 96% on room air.  WBC 4.4, hemoglobin 8.9, platelets 117,000.  CT of the abdomen and pelvis showed circumferential wall thickening of the cecum, ascending colon, proximal transverse colon.  There was also a small supraumbilical hernia.  There was dilatation of the common bile duct and main pancreatic duct.  Chest x-ray showed bibasilar opacities, left greater than right.  Assessment/Plan: Diarrhea/colitis -Broaden antibiotic coverage to Zosyn which will cover her pneumonia -GI consult -Collect C. Difficile -Stool pathogen panel  Lobar pneumonia -Personally reviewed chest x-ray--bibasilar opacities, left greater than right -Continue azithromycin -Broaden antibiotic coverage with Merrem (PCN allergy) given the patient's HIV status and nursing home residence  Acute respiratory failure with hypoxia -Currently stable on 3 L -Secondary to pneumonia -Wean oxygen  as tolerated for saturation greater 92%  Dilated common bile duct and pancreatic duct -LFTs and bilirubin are normal -GI consult -Defer need for MRCP to GI  ESRD -Nephrology consult for maintenance dialysis--spoke with Dr. Marval Regal  HIV -HIV RNA -CD4 count -Continue dolutegravir, lamivudine, and zidovudine  Thrombocytopenia -This appears to be chronic -Monitor for signs of bleeding  Depression/anxiety -Continue Remeron and Zoloft  Chronic pain syndrome -Continue fentanyl transdermal patch       Status is: Inpatient  Remains inpatient appropriate because:need for IV abx, new oxygen demand, GI work up   Dispo: The patient is from: SNF              Anticipated d/c is to: SNF              Anticipated d/c date is: 2 days              Patient currently is not medically stable to d/c.         Family Communication:   No Family at bedside  Consultants:GI, renal    Code Status:  FULL   DVT Prophylaxis:  [place] Heparin   Procedures: As Listed in Progress Note Above  Antibiotics: merrem 5/20>>> azithro 5/19>>       Subjective: Patient complains of some shortness of breath and nonproductive cough.  She denies any chest pain, nausea, vomiting.  She has some periumbilical left lower quadrant abdominal pain.  There is no bowel movement since admission.  There is no hematochezia or melena.  She denies any headache or visual disturbance.  Denies any fevers, chills.  Objective: Vitals:   07/19/19 6606 07/19/19 2351 07/20/19 0113 07/20/19 0045  BP:   119/67 124/65  Pulse:   (!) 52 61  Resp:   19 16  Temp:   97.8 F (36.6 C) 98 F (36.7 C)  TempSrc:   Oral Oral  SpO2: (!) 88% 100% 100% 100%  Weight:      Height:        Intake/Output Summary (Last 24 hours) at 07/20/2019 7782 Last data filed at 07/20/2019 0300 Gross per 24 hour  Intake 763.59 ml  Output --  Net 763.59 ml   Weight change:  Exam:   General:  Pt is alert, follows commands  appropriately, not in acute distress  HEENT: No icterus, No thrush, No neck mass, [place]/AT  Cardiovascular: RRR, S1/S2, no rubs, no gallops  Respiratory: Bibasilar rales but no wheezing.  Good air movement.  Abdomen: Soft/+BS, diffusely tender, non distended, no guarding  Extremities: No edema, No lymphangitis, No petechiae, No rashes, no synovitis   Data Reviewed: I have personally reviewed following labs and imaging studies Basic Metabolic Panel: Recent Labs  Lab 07/19/19 1104 07/20/19 0447  NA 137 136  K 4.6 4.8  CL 94* 95*  CO2 27 26  GLUCOSE 87 80  BUN 59* 64*  CREATININE 9.29* 9.70*  CALCIUM 9.1 9.1   Liver Function Tests: Recent Labs  Lab 07/19/19 1104  AST 15  ALT 12  ALKPHOS 114  BILITOT 0.6  PROT 7.9  ALBUMIN 3.4*   No results for input(s): LIPASE, AMYLASE in the last 168 hours. No results for input(s): AMMONIA in the last 168 hours. Coagulation Profile: No results for input(s): INR, PROTIME in the last 168 hours. CBC: Recent Labs  Lab 07/19/19 1104 07/20/19 0447  WBC 4.4 3.2*  NEUTROABS 2.8  --   HGB 8.9* 8.0*  HCT 29.6* 27.0*  MCV 115.2* 114.4*  PLT 117* 105*   Cardiac Enzymes: No results for input(s): CKTOTAL, CKMB, CKMBINDEX, TROPONINI in the last 168 hours. BNP: Invalid input(s): POCBNP CBG: Recent Labs  Lab 07/19/19 2013 07/19/19 2135  GLUCAP 66* 71   HbA1C: No results for input(s): HGBA1C in the last 72 hours. Urine analysis:    Component Value Date/Time   COLORURINE YELLOW 01/19/2009 1855   APPEARANCEUR CLEAR 01/19/2009 1855   LABSPEC 1.015 01/19/2009 1855   PHURINE 7.5 01/19/2009 1855   GLUCOSEU NEGATIVE 01/19/2009 1855   HGBUR SMALL (A) 01/19/2009 1855   BILIRUBINUR NEGATIVE 01/19/2009 1855   KETONESUR NEGATIVE 01/19/2009 1855   PROTEINUR 100 (A) 01/19/2009 1855   UROBILINOGEN 0.2 01/19/2009 1855   NITRITE NEGATIVE 01/19/2009 1855   LEUKOCYTESUR NEGATIVE 01/19/2009 1855   Sepsis  Labs: @LABRCNTIP (procalcitonin:4,lacticidven:4) ) Recent Results (from the past 240 hour(s))  SARS Coronavirus 2 by RT PCR (hospital order, performed in [place] hospital lab) Nasopharyngeal Nasopharyngeal Swab     Status: None   Collection Time: 07/19/19  3:43 PM   Specimen: Nasopharyngeal Swab  Result Value Ref Range Status   SARS Coronavirus 2 NEGATIVE NEGATIVE Final    Comment: (NOTE) SARS-CoV-2 target nucleic acids are NOT DETECTED. The SARS-CoV-2 RNA is generally detectable in upper and lower respiratory specimens during the acute phase of infection. The lowest concentration of SARS-CoV-2 viral copies this assay can detect is 250 copies / mL. A negative result does not preclude SARS-CoV-2 infection and should not be used as the sole basis for treatment or other patient management decisions.  A negative result may occur with improper specimen collection / handling, submission of specimen other than nasopharyngeal swab, presence of viral  mutation(s) within the areas targeted by this assay, and inadequate number of viral copies (<250 copies / mL). A negative result must be combined with clinical observations, patient history, and epidemiological information. Fact Sheet for Patients:   StrictlyIdeas.no Fact Sheet for Healthcare Providers: BankingDealers.co.za This test is not yet approved or cleared  by the [place] FDA and has been authorized for detection and/or diagnosis of SARS-CoV-2 by FDA under an Emergency Use Authorization (EUA).  This EUA will remain in effect (meaning this test can be used) for the duration of the COVID-19 declaration under Section 564(b)(1) of the Act, 21 U.S.C. section 360bbb-3(b)(1), unless the authorization is terminated or revoked sooner. Performed at [facility], [address], [place], [place] [address]   MRSA PCR Screening     Status: None   Collection Time: 07/19/19  8:26 PM   Specimen:  Nasopharyngeal  Result Value Ref Range Status   MRSA by PCR NEGATIVE NEGATIVE Final    Comment:        The GeneXpert MRSA Assay (FDA approved for NASAL specimens only), is one component of a comprehensive MRSA colonization surveillance program. It is not intended to diagnose MRSA infection nor to guide or monitor treatment for MRSA infections. Performed at [facility], [address], [place], [place] [address]      Scheduled Meds: . calcium acetate  2,001 mg Oral TID WC  . Chlorhexidine Gluconate Cloth  6 each Topical Daily  . diltiazem  60 mg Oral BID  . dolutegravir  50 mg Oral Daily  . fentaNYL  1 patch Transdermal Q72H  . heparin  5,000 Units Subcutaneous Q8H  . lamiVUDine  50 mg Oral Daily  . metoprolol tartrate  50 mg Oral BID  . mirtazapine  15 mg Oral QHS  . multivitamin  1 tablet Oral QHS  . sertraline  125 mg Oral QHS  . traZODone  50 mg Oral QHS  . zidovudine  100 mg Oral TID   Continuous Infusions: . azithromycin 500 mg (07/19/19 2333)  . cefTRIAXone (ROCEPHIN)  IV    . dextrose 5 % and 0.9% NaCl 40 mL/hr at 07/19/19 2135  . metronidazole 500 mg (07/20/19 0998)    Procedures/Studies: CT SOFT TISSUE NECK W CONTRAST  Result Date: 07/19/2019 CLINICAL DATA:  Initial evaluation for shortness of breath, difficulty with breathing. EXAM: CT NECK WITH CONTRAST TECHNIQUE: Multidetector CT imaging of the neck was performed using the standard protocol following the bolus administration of intravenous contrast. CONTRAST:  26mL OMNIPAQUE IOHEXOL 300 MG/ML  SOLN COMPARISON:  Comparison made with prior neck CT from 08/24/2002. FINDINGS: Pharynx and larynx: Examination somewhat technically limited by patient positioning. Oral cavity within normal limits without discrete mass or collection. Patient is edentulous. No base of tongue lesion. Oropharynx within normal limits. No evidence for tonsillitis or acute peritonsillar abscess. Mild asymmetric fullness at the right nasopharynx  favored to be related to positioning. No discrete mass identified. No retropharyngeal collection or swelling. Epiglottis within normal limits. Vallecula clear. Remainder of the hypopharynx and supraglottic larynx within normal limits. True cords fairly symmetric and normal. Subglottic airway largely clear. Salivary glands: Salivary glands including the parotid and submandibular glands are within normal limits. Thyroid: Few subcentimeter dystrophic calcifications noted within the thyroid. Thyroid otherwise unremarkable. Lymph nodes: No pathologically enlarged lymph nodes seen within the neck. Mildly prominent 6 mm left level V node with surrounding inflammatory stranding noted (series 6, image 54), indeterminate, but could be reactive in nature. Vascular: Moderate atherosclerotic  change present about the aortic arch, carotid bifurcations, and carotid siphons. Diffuse tortuosity the major arterial vasculature of the neck suggesting chronic underlying hypertension. Both carotid artery systems partially medialized into the retropharyngeal space. Vascular stent in place within the distal right subclavian vein as it dumps into the SVC. Patent flow seen through the stent. 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 Limited intracranial: Unremarkable. Visualized orbits: Globes and orbital soft tissues within normal limits. Mastoids and visualized paranasal sinuses: Visualized paranasal sinuses are clear. Visualized mastoids and middle ear cavities are well pneumatized and free of fluid. Skeleton: No acute osseous abnormality. Bones are diffusely sclerotic with somewhat permeative appearance, consistent with underlying renal osteodystrophy. Cervicothoracic scoliotic curvature with underlying moderate multilevel degenerative spondylosis, most pronounced at C4-5 through C6-7. At [facility] axial fusion noted. Prominent degenerative changes noted about the  TMJs bilaterally. Upper chest: Hazy fat stranding within the visualized right chest wall and axillary region favored to be related to vascular congestion. Visualized upper chest demonstrates no other acute abnormality. Partially visualized lungs are largely clear. Mild paraseptal emphysema. Other: None. IMPRESSION: 1. Mildly prominent 6 mm left level V node with surrounding inflammatory stranding, indeterminate, but could be reactive in nature. 2. No other acute abnormality within the neck. 3. Diffuse tortuosity the major arterial vasculature of the neck suggesting chronic underlying hypertension. 4. Vascular stent in place within the distal right subclavian vein with patent flow. Multiple prominent venous collaterals throughout the neck and upper chest wall. 5. Cervicothoracic scoliotic curvature with underlying moderate multilevel degenerative spondylosis, most pronounced at C4-5 through C6-7. 6. Diffuse sclerosis with somewhat permeative appearance of the bones, most likely related to underlying renal osteodystrophy. 7. Aortic Atherosclerosis (ICD10-I70.0) and Emphysema (ICD10-J43.9). Electronically Signed   By: Jeannine Boga M.D.   On: 07/19/2019 21:19   CT ABDOMEN PELVIS W CONTRAST  Result Date: 07/19/2019 CLINICAL DATA:  Chronic pancytopenia, abdominal pain. Possible hernia EXAM: CT ABDOMEN AND PELVIS WITH CONTRAST TECHNIQUE: Multidetector CT imaging of the abdomen and pelvis was performed using the standard protocol following bolus administration of intravenous contrast. CONTRAST:  160mL OMNIPAQUE IOHEXOL 300 MG/ML  SOLN COMPARISON:  10/07/2011 FINDINGS: Lower chest: Cardiomegaly. Chronic skin thickening and edematous appearance of the visualized right breast, similar in appearance to prior CT 10/07/2011. Visualized lung bases are clear. Hepatobiliary: No focal liver lesion. Small layering hyperdensity within the gallbladder lumen likely reflecting a small stone. The common bile duct is dilated  measuring 15 mm the level of the pancreatic head (previously measured 9 mm). No discrete stone within the common bile duct. Pancreas: The main pancreatic duct is dilated measuring up to 8 mm, which is new from prior. No discrete pancreatic lesion. No peripancreatic inflammatory changes. Spleen: Stable mild splenomegaly. Adrenals/Urinary Tract: Unremarkable adrenal glands. Atrophic kidneys with innumerable predominantly low-density lesions likely reflecting cystic disease of dialysis. Urinary bladder is largely decompressed, limiting its evaluation. Stomach/Bowel: The bowel is well distended with oral contrast. There is prominent circumferential bowel wall thickening involving the cecum, ascending colon, and proximal transverse colon. Appendix not definitively visualized. Scattered sigmoid diverticulosis. No bowel obstruction. Stomach appears within normal limits. Vascular/Lymphatic: Aortoiliac atherosclerosis without aneurysm. No abdominopelvic lymphadenopathy is visualized. Reproductive: Status post hysterectomy. No adnexal masses. Other: No ascites. No abdominopelvic fluid collection. No pneumoperitoneum. Previously seen small 12 x 9 mm supraumbilical abdominal wall hernia is now hyperattenuating with stranding within the hernia sac (series 7, image 38). Musculoskeletal: Diffusely abnormal sclerotic appearance of the  osseous structures compatible with chronic renal osteodystrophy. No acute fracture identified. IMPRESSION: 1. Prominent circumferential bowel wall thickening involving the cecum, ascending colon, and proximal transverse colon compatible with a infectious or inflammatory colitis. 2. Small 1.2 cm supraumbilical abdominal wall hernia previously containing only fat with new increased density/stranding within the hernia sac concerning for strangulation. 3. Interval dilation of the common bile duct and main pancreatic duct without definitive obstructive etiology. Further evaluation with MRI/MRCP is  recommended. Non-emergent MRI should be deferred until patient has been discharged for the acute illness, and can optimally cooperate with positioning and breath-holding instructions. 4. Chronic skin thickening and edematous changes to the visualized right breast, similar to prior CT 2013. 5. Additional chronic findings, as above. Electronically Signed   By: Davina Poke D.O.   On: 07/19/2019 15:19   DG CHEST PORT 1 VIEW  Result Date: 07/19/2019 CLINICAL DATA:  69 year old with shortness of breath. Current history of HIV, hypertension, pulmonary hypertension and atrial flutter. Former smoker. Negative COVID-19 test earlier today. EXAM: PORTABLE CHEST 1 VIEW COMPARISON:  07/23/2017. FINDINGS: Cardiac silhouette moderately enlarged, unchanged. Thoracic aorta atherosclerotic, unchanged. Massive enlargement of the central pulmonary arteries, even more so than on the examination 2 years ago. Patchy airspace opacities with air bronchograms medially in the LEFT lung base and to a lesser degree medially in the RIGHT lung base. Lungs otherwise clear. Pulmonary vascularity normal without evidence of pulmonary edema. No visible pleural effusions. RIGHT subclavian vein stent. Surgical clips in the low RIGHT axilla/axillary tail of the RIGHT breast. IMPRESSION: 1. Bibasilar pneumonia, LEFT greater than RIGHT. 2. Stable cardiomegaly without evidence of pulmonary edema. 3. Massive enlargement of the central pulmonary arteries indicating pulmonary arterial hypertension, with interval increase in size of the pulmonary arteries since the examination 2 years ago. Electronically Signed   By: Evangeline Dakin M.D.   On: 07/19/2019 20:57    Orson Eva, DO  [facility]  If 7PM-7AM, please contact night-coverage [email or web address] Password [email or web address] 07/20/2019, 8:07 AM   LOS: 1 day

## 2019-07-20 NOTE — Progress Notes (Signed)
Pharmacy Antibiotic Note  [name] is a 69 y.o. female admitted on 07/19/2019 with pneumonia/colitis.  Pharmacy has been consulted for Merrem dosing.  Plan: Meropenem 500 mg IV every 24 hours. Monitor labs, c/s, and patient improvement.  Height: 5\' 2"  (157.5 cm) Weight: 49.8 kg (109 lb 12.6 oz) IBW/kg (Calculated) : 50.1  Temp (24hrs), Avg:98 F (36.7 C), Min:97.8 F (36.6 C), Max:98.2 F (36.8 C)  Recent Labs  Lab 07/19/19 1104 07/20/19 0447  WBC 4.4 3.2*  CREATININE 9.29* 9.70*  LATICACIDVEN 0.8  --     Estimated Creatinine Clearance: 4.5 mL/min (A) (by C-G formula based on SCr of 9.7 mg/dL (H)).    Allergies  Allergen Reactions  . Lactose   . Lactose Intolerance (Gi) Other (See Comments)    On MAR  . Other   . Latex Rash and Itching  . Penicillins Rash and Swelling    Has patient had a PCN reaction causing immediate rash, facial/tongue/throat swelling, SOB or lightheadedness with hypotension: No Has patient had a PCN reaction causing severe rash involving mucus membranes or skin necrosis: No Has patient had a PCN reaction that required hospitalization No Has patient had a PCN reaction occurring within the last 10 years: No If all of the above answers are "NO", then may proceed with Cephalosporin use.      Antimicrobials this admission: Merrem 5/20 >>  Azith 5/19 >>  Cefepime 5/19 x 1 Flagyl 5/19>> 5/20  Dose adjustments this admission: [place]  Microbiology results:   5/19 MRSA PCR: negative CDiff: pending  Thank you for allowing pharmacy to be a part of this patient's care.  Margot Ables, PharmD Clinical Pharmacist 07/20/2019 1:05 PM

## 2019-07-20 NOTE — Procedures (Signed)
     HEMODIALYSIS TREATMENT NOTE:   3 hour heparin-free HD completed via RUE AVF (15g/antegrade). Goal met: 3 liters removed without interruption in UF.  All blood was returned and hemostasis was achieved in 15 minutes.  No changes from pre-treatment assessment.   , RN 

## 2019-07-20 NOTE — Consult Note (Signed)
[facility] Renal Consultation Note    Indication for Consultation:  Management of ESRD/hemodialysis; anemia, hypertension/volume and secondary hyperparathyroidism  HPI: [name] is a 69 y.o. female with a PMH significant for HIV, HTN, atrial fibrillation, h/o TIA, thrombocytopenia, GERD, pulmonary HTN, ESRD on HD every TTS at [facility] who presented to [facility] ED yesterday with worsening shortness of breath, 1 week history of abdominal pain, loose stools, as well as fevers and chills.  She was found to have bibasilar opacities L>R concerning for CAP and was admitted for IV abx.  CT of abdomen showed wall thickening of the cecum, ascending colon, and prox transverse colon concerning for colitis.  We were consulted to provide HD during her hospitalization.    She is a poor historian and the HPI was obtained from review of her existing EMR's.    Past Medical History:  Diagnosis Date  . Anemia   . Anxiety   . Atrial flutter ([place]) 02/22/2015  . C. difficile colitis 10/10/11  . Chronic diarrhea   . Depression   . ESRD on hemodialysis ([place])   . GERD (gastroesophageal reflux disease)   . HIV (human immunodeficiency virus infection) ([place])   . Hypertension   . Insomnia   . Intracranial hemorrhage ([place])   . Muscle weakness (generalized)   . Pulmonary HTN ([place])   . Tachycardia   . TIA (transient ischemic attack)   . Traumatic hematoma of right upper arm 02/22/2015   Past Surgical History:  Procedure Laterality Date  . A/V FISTULAGRAM N/A 04/17/2016   Procedure: A/V Fistulagram - Right Upper;  Surgeon: Waynetta Sandy, MD;  Location: [place] [facility];  Service: Cardiovascular;  Laterality: N/A;  . BIOPSY THYROID    . Dialysis Shunts     previous one removed from left arm and now present one in right arm  . DIALYSIS/PERMA CATHETER INSERTION Right 04/17/2016   Procedure: dialysis Catheter Insertion central veinous;  Surgeon: Waynetta Sandy, MD;  Location:  [place] [facility];  Service: Cardiovascular;  Laterality: Right;  . ESOPHAGOGASTRODUODENOSCOPY  12/15/2010   Rourk: erosive reflux esophagitis, MW tear, hiatal hernia (small), gastritis with no h.pylori, possibly nsaid related  . EXCISION OF BREAST BIOPSY Right 05/04/2012   Procedure: EXCISION OF BREAST BIOPSY;  Surgeon: Donato Heinz, MD;  Location: [facility];  Service: General;  Laterality: Right;  Right Excisional Breast Biopsy  . FISTULOGRAM Right 03/26/2014   Procedure: Right Arm Fistulogram with Venoplasty Right Subclavian Vein and Inominate Vein. Debridement Fistula Ulcer;  Surgeon: Conrad Pembroke, MD;  Location: [place];  Service: Vascular;  Laterality: Right;  . FISTULOGRAM Right 03/29/2017   Procedure: FISTULOGRAM COMPLEX RIGHT ARM with Balloon angioplasty;  Surgeon: Waynetta Sandy, MD;  Location: [place];  Service: Vascular;  Laterality: Right;  . IR GENERIC HISTORICAL  05/19/2016   IR REMOVAL TUN CV CATH W/O FL 05/19/2016 Markus Daft, MD [facility]-INTERV RAD  . PERIPHERAL VASCULAR BALLOON ANGIOPLASTY Right 04/17/2016   Procedure: Peripheral Vascular Balloon Angioplasty;  Surgeon: Waynetta Sandy, MD;  Location: [place] [facility];  Service: Cardiovascular;  Laterality: Right;  Central segment AV Fistula  . PERIPHERAL VASCULAR BALLOON ANGIOPLASTY Right 03/14/2018   Procedure: PERIPHERAL VASCULAR BALLOON ANGIOPLASTY;  Surgeon: Waynetta Sandy, MD;  Location: [place] [facility];  Service: Cardiovascular;  Laterality: Right;  subclavian vein  . PERIPHERAL VASCULAR INTERVENTION Right 03/14/2018   Procedure: PERIPHERAL VASCULAR INTERVENTION;  Surgeon: Waynetta Sandy, MD;  [facility]: [facility]  INVASIVE CV LAB;  Service: Cardiovascular;  Laterality: Right;  subclavian vein  . SHUNTOGRAM Right 10/19/2013   Procedure: FISTULOGRAM;  Surgeon: Conrad , MD;  Location: [facility] CATH LAB;  Service: Cardiovascular;  Laterality: Right;  . TONSILLECTOMY    . VISCERAL VENOGRAPHY Right 03/14/2018    Procedure: CENTRAL VENOGRAPHY;  Surgeon: Waynetta Sandy, MD;  Location: [place] [facility];  Service: Cardiovascular;  Laterality: Right;  upper ext fistula   Family History:   Family History  Problem Relation Age of Onset  . Anesthesia problems Neg Hx   . Hypotension Neg Hx   . Malignant hyperthermia Neg Hx   . Pseudochol deficiency Neg Hx    Social History:  reports that she quit smoking about 36 years ago. She has never used smokeless tobacco. She reports that she does not drink alcohol or use drugs. Allergies  Allergen Reactions  . Lactose   . Lactose Intolerance (Gi) Other (See Comments)    On MAR  . Other   . Latex Rash and Itching  . Penicillins Rash and Swelling    Has patient had a PCN reaction causing immediate rash, facial/tongue/throat swelling, SOB or lightheadedness with hypotension: No Has patient had a PCN reaction causing severe rash involving mucus membranes or skin necrosis: No Has patient had a PCN reaction that required hospitalization No Has patient had a PCN reaction occurring within the last 10 years: No If all of the above answers are "NO", then may proceed with Cephalosporin use.     Prior to Admission medications   Medication Sig Start Date End Date Taking? Authorizing Provider  Biotin 5000 MCG CAPS Take 1 capsule by mouth daily.    Yes [provider]  calcium acetate, Phos Binder, (PHOSLYRA) 667 MG/5ML SOLN Take 2,001 mg by mouth 3 (three) times daily with meals.   Yes [provider]  calcium carbonate (TUMS - DOSED IN MG ELEMENTAL CALCIUM) 500 MG chewable tablet Chew 2 tablets by mouth in the morning and at bedtime.   Yes [provider]  clopidogrel (PLAVIX) 75 MG tablet Take 1 tablet (75 mg total) by mouth daily. 03/29/17  Yes Waynetta Sandy, MD  diltiazem (CARDIZEM) 60 MG tablet Take 60 mg by mouth 2 (two) times daily.   Yes [provider]  dolutegravir (TIVICAY) 50 MG tablet Take 50 mg by  mouth daily.   Yes [provider]  fentaNYL (DURAGESIC - DOSED MCG/HR) 50 MCG/HR Apply one patch every 72 hours for pain. Remove old patch. External use only. Rotate sites. Patient taking differently: Place 50 mcg onto the skin every 3 (three) days. Remove old patch. External use only. Rotate sites. 02/28/15  Yes Lauree Chandler, NP  lamivudine (EPIVIR) 100 MG tablet Take 50 mg by mouth daily.   Yes [provider]  Lidocaine HCl (ASPERCREME W/LIDOCAINE) 4 % CREA Apply 1 application topically 2 (two) times daily as needed (to bilateral wrists for pain).    Yes [provider]  lidocaine-prilocaine (EMLA) cream Apply 1 application topically Every Tuesday,Thursday,and Saturday with dialysis. 1 hour prior to dialysis   Yes [provider]  metoprolol (LOPRESSOR) 50 MG tablet Take 50 mg by mouth 2 (two) times daily.   Yes [provider]  mirtazapine (REMERON) 15 MG tablet Take 15 mg by mouth at bedtime.   Yes [provider]  multivitamin (RENA-VIT) TABS tablet Take 1 tablet by mouth at bedtime.   Yes [provider]  omeprazole ([place])  40 MG capsule Take 40 mg by mouth in the morning.   Yes [provider]  Oxycodone HCl 10 MG TABS Take 10 mg by mouth every 12 (twelve) hours as needed (severe pain).   Yes [provider]  promethazine (PHENERGAN) 12.5 MG tablet Take 12.5 mg by mouth every Monday, Wednesday, and Friday with hemodialysis. May take an additional 12.5 mg dose every 6 hours as needed for nausea   Yes [provider]  promethazine (PHENERGAN) 6.25 MG/5ML syrup Place 12.5 mg into feeding tube daily as needed for nausea or vomiting.   Yes [provider]  sertraline (ZOLOFT) 100 MG tablet Take 100 mg by mouth at bedtime. 100mg  with 25mg  at bedtime   Yes [provider]  sertraline (ZOLOFT) 25 MG tablet Take 25 mg by mouth at bedtime. Take 25 mg with the 100 mg.    Yes [provider]  traZODone (DESYREL) 50 MG tablet Take 50 mg by mouth at bedtime.   Yes [provider]  vitamin B-12 (CYANOCOBALAMIN) 100 MCG tablet Take 100 mcg by mouth daily.   Yes [provider]  vitamin C (ASCORBIC ACID) 500 MG tablet Take 500 mg by mouth 2 (two) times daily.   Yes [provider]  zidovudine (RETROVIR) 100 MG capsule Take 100 mg by mouth 3 (three) times daily.     Yes [provider]   Current Facility-Administered Medications  Medication Dose Route Frequency Provider Last Rate Last Admin  . acetaminophen (TYLENOL) tablet 650 mg  650 mg Oral Q6H PRN Emokpae, Ejiroghene E, MD       Or  . acetaminophen (TYLENOL) suppository 650 mg  650 mg Rectal Q6H PRN Emokpae, Ejiroghene E, MD      . azithromycin (ZITHROMAX) 500 mg in sodium chloride 0.9 % 250 mL IVPB  500 mg Intravenous Q24H Emokpae, Ejiroghene E, MD 250 mL/hr at 07/19/19 2333 500 mg at 07/19/19 2333  . calcium acetate (PHOSLO) capsule 2,001 mg  2,001 mg Oral TID WC Orson Eva, MD   2,001 mg at 07/20/19 1124  . Chlorhexidine Gluconate Cloth 2 % PADS 6 each  6 each Topical Daily Emokpae, Ejiroghene E, MD      . Chlorhexidine Gluconate Cloth 2 % PADS 6 each  6 each Topical Q0600 Donato Heinz, MD      . dolutegravir (TIVICAY) tablet 50 mg  50 mg Oral Daily Emokpae, Ejiroghene E, MD   50 mg at 07/20/19 0945  . fentaNYL (DURAGESIC) 50 MCG/HR 1 patch  1 patch Transdermal Q72H Emokpae, Ejiroghene E, MD   1 patch at 07/19/19 2341  . heparin injection 5,000 Units  5,000 Units Subcutaneous Q8H Emokpae, Ejiroghene E, MD   5,000 Units at 07/20/19 0612  . lamiVUDine (EPIVIR) 10 MG/ML solution 50 mg  50 mg Oral Daily Emokpae, Ejiroghene E, MD      . metoprolol tartrate (LOPRESSOR) tablet 12.5 mg  12.5 mg Oral BID Tat, David, MD   12.5 mg at 07/20/19 1023  . mirtazapine (REMERON) tablet 15 mg  15 mg Oral QHS Emokpae, Ejiroghene E, MD   15 mg at 07/19/19 2334  . multivitamin (RENA-VIT) tablet 1  tablet  1 tablet Oral QHS Emokpae, Ejiroghene E, MD   1 tablet at 07/19/19 2334  . ondansetron (ZOFRAN) tablet 4 mg  4 mg Oral Q6H PRN Emokpae, Ejiroghene E, MD       Or  . ondansetron (ZOFRAN) injection 4 mg  4 mg Intravenous Q6H  PRN Bethena Roys, MD   4 mg at 07/20/19 1124  . oxyCODONE (Oxy IR/ROXICODONE) immediate release tablet 10 mg  10 mg Oral Q12H PRN Emokpae, Ejiroghene E, MD      . polyethylene glycol (MIRALAX / GLYCOLAX) packet 17 g  17 g Oral Daily PRN Emokpae, Ejiroghene E, MD      . sertraline (ZOLOFT) tablet 125 mg  125 mg Oral QHS Emokpae, Ejiroghene E, MD   125 mg at 07/19/19 2334  . traZODone (DESYREL) tablet 50 mg  50 mg Oral QHS Emokpae, Ejiroghene E, MD   50 mg at 07/19/19 2352  . zidovudine (RETROVIR) capsule 100 mg  100 mg Oral TID Denton Brick, Ejiroghene E, MD   100 mg at 07/19/19 2335   Labs: Basic Metabolic Panel: Recent Labs  Lab 07/19/19 1104 07/20/19 0447  NA 137 136  K 4.6 4.8  CL 94* 95*  CO2 27 26  GLUCOSE 87 80  BUN 59* 64*  CREATININE 9.29* 9.70*  CALCIUM 9.1 9.1   Liver Function Tests: Recent Labs  Lab 07/19/19 1104  AST 15  ALT 12  ALKPHOS 114  BILITOT 0.6  PROT 7.9  ALBUMIN 3.4*   No results for input(s): LIPASE, AMYLASE in the last 168 hours. No results for input(s): AMMONIA in the last 168 hours. CBC: Recent Labs  Lab 07/19/19 1104 07/20/19 0447  WBC 4.4 3.2*  NEUTROABS 2.8  --   HGB 8.9* 8.0*  HCT 29.6* 27.0*  MCV 115.2* 114.4*  PLT 117* 105*   Cardiac Enzymes: No results for input(s): CKTOTAL, CKMB, CKMBINDEX, TROPONINI in the last 168 hours. CBG: Recent Labs  Lab 07/19/19 2013 07/19/19 2135 07/20/19 1105  GLUCAP 66* 71 74   Iron Studies: No results for input(s): IRON, TIBC, TRANSFERRIN, FERRITIN in the last 72 hours. Studies/Results: CT SOFT TISSUE NECK W CONTRAST  Result Date: 07/19/2019 CLINICAL DATA:  Initial evaluation for shortness of breath, difficulty with breathing. EXAM: CT NECK WITH CONTRAST  TECHNIQUE: Multidetector CT imaging of the neck was performed using the standard protocol following the bolus administration of intravenous contrast. CONTRAST:  62mL OMNIPAQUE IOHEXOL 300 MG/ML  SOLN COMPARISON:  Comparison made with prior neck CT from 08/24/2002. FINDINGS: Pharynx and larynx: Examination somewhat technically limited by patient positioning. Oral cavity within normal limits without discrete mass or collection. Patient is edentulous. No base of tongue lesion. Oropharynx within normal limits. No evidence for tonsillitis or acute peritonsillar abscess. Mild asymmetric fullness at the right nasopharynx favored to be related to positioning. No discrete mass identified. No retropharyngeal collection or swelling. Epiglottis within normal limits. Vallecula clear. Remainder of the hypopharynx and supraglottic larynx within normal limits. True cords fairly symmetric and normal. Subglottic airway largely clear. Salivary glands: Salivary glands including the parotid and submandibular glands are within normal limits. Thyroid: Few subcentimeter dystrophic calcifications noted within the thyroid. Thyroid otherwise unremarkable. Lymph nodes: No pathologically enlarged lymph nodes seen within the neck. Mildly prominent 6 mm left level V node with surrounding inflammatory stranding noted (series 6, image 54), indeterminate, but could be reactive in nature. Vascular: Moderate atherosclerotic change present about the aortic arch, carotid bifurcations, and carotid siphons. Diffuse tortuosity the major arterial vasculature of the neck suggesting chronic underlying hypertension. Both carotid artery systems partially medialized into the retropharyngeal space. Vascular stent in place within the distal right subclavian vein as it dumps into the SVC. Patent flow seen through the stent. Multiple scattered venous collateral seen throughout the upper chest wall, suggesting  possible previous SVC thrombosis. Limited intracranial:  Unremarkable. Visualized orbits: Globes and orbital soft tissues within normal limits. Mastoids and visualized paranasal sinuses: Visualized paranasal sinuses are clear. Visualized mastoids and middle ear cavities are well pneumatized and free of fluid. Skeleton: No acute osseous abnormality. Bones are diffusely sclerotic with somewhat permeative appearance, consistent with underlying renal osteodystrophy. Cervicothoracic scoliotic curvature with underlying moderate multilevel degenerative spondylosis, most pronounced at C4-5 through C6-7. At [facility] axial fusion noted. Prominent degenerative changes noted about the TMJs bilaterally. Upper chest: Hazy fat stranding within the visualized right chest wall and axillary region favored to be related to vascular congestion. Visualized upper chest demonstrates no other acute abnormality. Partially visualized lungs are largely clear. Mild paraseptal emphysema. Other: None. IMPRESSION: 1. Mildly prominent 6 mm left level V node with surrounding inflammatory stranding, indeterminate, but could be reactive in nature. 2. No other acute abnormality within the neck. 3. Diffuse tortuosity the major arterial vasculature of the neck suggesting chronic underlying hypertension. 4. Vascular stent in place within the distal right subclavian vein with patent flow. Multiple prominent venous collaterals throughout the neck and upper chest wall. 5. Cervicothoracic scoliotic curvature with underlying moderate multilevel degenerative spondylosis, most pronounced at C4-5 through C6-7. 6. Diffuse sclerosis with somewhat permeative appearance of the bones, most likely related to underlying renal osteodystrophy. 7. Aortic Atherosclerosis (ICD10-I70.0) and Emphysema (ICD10-J43.9). Electronically Signed   By: Jeannine Boga M.D.   On: 07/19/2019 21:19   CT ABDOMEN PELVIS W CONTRAST  Result Date: 07/19/2019 CLINICAL DATA:  Chronic pancytopenia, abdominal pain. Possible hernia EXAM: CT  ABDOMEN AND PELVIS WITH CONTRAST TECHNIQUE: Multidetector CT imaging of the abdomen and pelvis was performed using the standard protocol following bolus administration of intravenous contrast. CONTRAST:  141mL OMNIPAQUE IOHEXOL 300 MG/ML  SOLN COMPARISON:  10/07/2011 FINDINGS: Lower chest: Cardiomegaly. Chronic skin thickening and edematous appearance of the visualized right breast, similar in appearance to prior CT 10/07/2011. Visualized lung bases are clear. Hepatobiliary: No focal liver lesion. Small layering hyperdensity within the gallbladder lumen likely reflecting a small stone. The common bile duct is dilated measuring 15 mm the level of the pancreatic head (previously measured 9 mm). No discrete stone within the common bile duct. Pancreas: The main pancreatic duct is dilated measuring up to 8 mm, which is new from prior. No discrete pancreatic lesion. No peripancreatic inflammatory changes. Spleen: Stable mild splenomegaly. Adrenals/Urinary Tract: Unremarkable adrenal glands. Atrophic kidneys with innumerable predominantly low-density lesions likely reflecting cystic disease of dialysis. Urinary bladder is largely decompressed, limiting its evaluation. Stomach/Bowel: The bowel is well distended with oral contrast. There is prominent circumferential bowel wall thickening involving the cecum, ascending colon, and proximal transverse colon. Appendix not definitively visualized. Scattered sigmoid diverticulosis. No bowel obstruction. Stomach appears within normal limits. Vascular/Lymphatic: Aortoiliac atherosclerosis without aneurysm. No abdominopelvic lymphadenopathy is visualized. Reproductive: Status post hysterectomy. No adnexal masses. Other: No ascites. No abdominopelvic fluid collection. No pneumoperitoneum. Previously seen small 12 x 9 mm supraumbilical abdominal wall hernia is now hyperattenuating with stranding within the hernia sac (series 7, image 38). Musculoskeletal: Diffusely abnormal sclerotic  appearance of the osseous structures compatible with chronic renal osteodystrophy. No acute fracture identified. IMPRESSION: 1. Prominent circumferential bowel wall thickening involving the cecum, ascending colon, and proximal transverse colon compatible with a infectious or inflammatory colitis. 2. Small 1.2 cm supraumbilical abdominal wall hernia previously containing only fat with new increased density/stranding within the hernia sac concerning for strangulation. 3. Interval dilation of the common bile duct and  main pancreatic duct without definitive obstructive etiology. Further evaluation with MRI/MRCP is recommended. Non-emergent MRI should be deferred until patient has been discharged for the acute illness, and can optimally cooperate with positioning and breath-holding instructions. 4. Chronic skin thickening and edematous changes to the visualized right breast, similar to prior CT 2013. 5. Additional chronic findings, as above. Electronically Signed   By: Davina Poke D.O.   On: 07/19/2019 15:19   DG CHEST PORT 1 VIEW  Result Date: 07/19/2019 CLINICAL DATA:  69 year old with shortness of breath. Current history of HIV, hypertension, pulmonary hypertension and atrial flutter. Former smoker. Negative COVID-19 test earlier today. EXAM: PORTABLE CHEST 1 VIEW COMPARISON:  07/23/2017. FINDINGS: Cardiac silhouette moderately enlarged, unchanged. Thoracic aorta atherosclerotic, unchanged. Massive enlargement of the central pulmonary arteries, even more so than on the examination 2 years ago. Patchy airspace opacities with air bronchograms medially in the LEFT lung base and to a lesser degree medially in the RIGHT lung base. Lungs otherwise clear. Pulmonary vascularity normal without evidence of pulmonary edema. No visible pleural effusions. RIGHT subclavian vein stent. Surgical clips in the low RIGHT axilla/axillary tail of the RIGHT breast. IMPRESSION: 1. Bibasilar pneumonia, LEFT greater than RIGHT. 2.  Stable cardiomegaly without evidence of pulmonary edema. 3. Massive enlargement of the central pulmonary arteries indicating pulmonary arterial hypertension, with interval increase in size of the pulmonary arteries since the examination 2 years ago. Electronically Signed   By: Evangeline Dakin M.D.   On: 07/19/2019 20:57    ROS: Pertinent items are noted in HPI. Physical Exam: Vitals:   07/19/19 2351 07/20/19 0113 07/20/19 0511 07/20/19 0945  BP:  119/67 124/65 109/62  Pulse:  (!) 52 61 (!) 54  Resp:  19 16   Temp:  97.8 F (36.6 C) 98 F (36.7 C)   TempSrc:  Oral Oral   SpO2: 100% 100% 100%   Weight:      Height:          Weight change:   Intake/Output Summary (Last 24 hours) at 07/20/2019 1158 Last data filed at 07/20/2019 0300 Gross per 24 hour  Intake 763.59 ml  Output -  Net 763.59 ml   BP 109/62   Pulse (!) 54   Temp 98 F (36.7 C) (Oral)   Resp 16   Ht 5\' 2"  (1.575 m)   Wt 49.8 kg   SpO2 100%   BMI 20.08 kg/m  General appearance: no distress and slowed mentation Head: Normocephalic, without obvious abnormality, atraumatic Resp: diminished breath sounds bibasilar Cardio: bradycardic, no rub GI: distended, firm, +BS, no guarding or rebound Extremities: extremities normal, atraumatic, no cyanosis or edema and RUE AVF +T/B Dialysis Access:  Dialysis Orders: Center:  [facility]  on TTS . EDW 46kg HD Bath 3K/2.5Ca  Time 3 hours Heparin 1000 bolus and 500 units/hr. Access RUE AVG BFR 350 DFR 600     Assessment/Plan: 1.  Diarrhea/colitis- C diff collected as well as stool pathogen panel.  On IV zosyn per primary 2. Bibasilar lobar pna- on azithromycin per primary 3.  ESRD -   Will plan on HD today 4.  Hypertension/volume  -  Soft BP, given lower dose of metoprolol earlier today. Hopefully hypotension will not be an issue with HD today.  She is 3 kg above edw and don't think we will get all of if today with her bp.  5.  Anemia  - Hgb dropped to 8.  Continue to  follow and transfuse prn.  Will  give dose of aranesp today. 6.  Metabolic bone disease -   Cont with binders 7.  Nutrition - renal diet when able to take regular food, now on clear liquid diet per primary.   Donetta Potts, MD [place] Pager [phone] 07/20/2019, 11:58 AM

## 2019-07-21 DIAGNOSIS — K529 Noninfective gastroenteritis and colitis, unspecified: Secondary | ICD-10-CM

## 2019-07-21 DIAGNOSIS — D638 Anemia in other chronic diseases classified elsewhere: Secondary | ICD-10-CM

## 2019-07-21 DIAGNOSIS — K439 Ventral hernia without obstruction or gangrene: Secondary | ICD-10-CM

## 2019-07-21 DIAGNOSIS — J9601 Acute respiratory failure with hypoxia: Secondary | ICD-10-CM

## 2019-07-21 DIAGNOSIS — K838 Other specified diseases of biliary tract: Secondary | ICD-10-CM

## 2019-07-21 DIAGNOSIS — N186 End stage renal disease: Secondary | ICD-10-CM

## 2019-07-21 DIAGNOSIS — Z992 Dependence on renal dialysis: Secondary | ICD-10-CM

## 2019-07-21 DIAGNOSIS — B20 Human immunodeficiency virus [HIV] disease: Secondary | ICD-10-CM

## 2019-07-21 DIAGNOSIS — J181 Lobar pneumonia, unspecified organism: Secondary | ICD-10-CM

## 2019-07-21 DIAGNOSIS — I10 Essential (primary) hypertension: Secondary | ICD-10-CM

## 2019-07-21 DIAGNOSIS — R197 Diarrhea, unspecified: Secondary | ICD-10-CM

## 2019-07-21 LAB — GASTROINTESTINAL PANEL BY PCR, STOOL (REPLACES STOOL CULTURE)

## 2019-07-21 LAB — CBC
HCT: 27 % — ABNORMAL LOW (ref 36.0–46.0)
Hemoglobin: 8.1 g/dL — ABNORMAL LOW (ref 12.0–15.0)
MCH: 34.5 pg — ABNORMAL HIGH (ref 26.0–34.0)
MCHC: 30 g/dL (ref 30.0–36.0)
MCV: 114.9 fL — ABNORMAL HIGH (ref 80.0–100.0)
Platelets: 100 10*3/uL — ABNORMAL LOW (ref 150–400)
RBC: 2.35 MIL/uL — ABNORMAL LOW (ref 3.87–5.11)
RDW: 13.8 % (ref 11.5–15.5)
WBC: 2.2 10*3/uL — ABNORMAL LOW (ref 4.0–10.5)
nRBC: 0 % (ref 0.0–0.2)

## 2019-07-21 LAB — HIV-1 RNA QUANT-NO REFLEX-BLD
HIV 1 RNA Quant: <20 copies/mL
LOG10 HIV-1 RNA: UNDETERMINED log10copy/mL

## 2019-07-21 LAB — COMPREHENSIVE METABOLIC PANEL
ALT: 10 U/L (ref 0–44)
AST: 13 U/L — ABNORMAL LOW (ref 15–41)
Albumin: 2.8 g/dL — ABNORMAL LOW (ref 3.5–5.0)
Alkaline Phosphatase: 100 U/L (ref 38–126)
Anion gap: 13 (ref 5–15)
BUN: 27 mg/dL — ABNORMAL HIGH (ref 8–23)
CO2: 27 mmol/L (ref 22–32)
Calcium: 9 mg/dL (ref 8.9–10.3)
Chloride: 96 mmol/L — ABNORMAL LOW (ref 98–111)
Creatinine, Ser: 5.81 mg/dL — ABNORMAL HIGH (ref 0.44–1.00)
GFR calc Af Amer: 8 mL/min — ABNORMAL LOW (ref >60)
GFR calc non Af Amer: 7 mL/min — ABNORMAL LOW (ref >60)
Glucose, Bld: 67 mg/dL — ABNORMAL LOW (ref 70–99)
Potassium: 4.1 mmol/L (ref 3.5–5.1)
Sodium: 136 mmol/L (ref 135–145)
Total Bilirubin: 0.6 mg/dL (ref 0.3–1.2)
Total Protein: 6.7 g/dL (ref 6.5–8.1)

## 2019-07-21 LAB — GLUCOSE, CAPILLARY
Glucose-Capillary: 120 mg/dL — ABNORMAL HIGH (ref 70–99)
Glucose-Capillary: 150 mg/dL — ABNORMAL HIGH (ref 70–99)
Glucose-Capillary: 62 mg/dL — ABNORMAL LOW (ref 70–99)
Glucose-Capillary: 67 mg/dL — ABNORMAL LOW (ref 70–99)
Glucose-Capillary: 70 mg/dL (ref 70–99)
Glucose-Capillary: 73 mg/dL (ref 70–99)

## 2019-07-21 LAB — C DIFFICILE QUICK SCREEN W PCR REFLEX
C Diff antigen: POSITIVE — AB
C Diff toxin: NEGATIVE

## 2019-07-21 MED ORDER — GLUCOSE 40 % PO GEL
ORAL | Status: AC
  Filled 2019-07-21: qty 1

## 2019-07-21 MED ORDER — GLUCOSE 40 % PO GEL
1.0000 | Freq: Once | ORAL | Status: AC
  Administered 2019-07-21: 37.5 g via ORAL

## 2019-07-21 NOTE — Progress Notes (Signed)
Subjective: The patient was sleeping when I entered the room.  She stated upon waking to voice that she was sleepy.  She gradually became more alert and awake.  Oriented and answering questions appropriately.  States her abdomen is sore, but abdominal pain is somewhat better.  Had a bowel movement last night that was formed, no diarrhea since admission.  No progressive or worsening nausea or vomiting.  Denies GERD symptoms.  No other overt GI complaints.  Objective: Vital signs in last 24 hours: Temp:  [98 F (36.7 C)-99.5 F (37.5 C)] 98.4 F (36.9 C) (05/21 0505) Pulse Rate:  [54-64] 55 (05/21 0505) Resp:  [12-20] 12 (05/21 0505) BP: (100-147)/(50-80) 100/50 (05/21 0505) SpO2:  [89 %-100 %] 100 % (05/21 0505) Weight:  [46.5 kg-49.2 kg] 46.5 kg (05/20 1820) Last BM Date: 07/20/19 General:   Alert and oriented, pleasant Head:  Normocephalic and atraumatic. Eyes:  No icterus, sclera clear. Conjuctiva pink.  Heart:  S1, S2 present, no murmurs noted.  Lungs: Clear to auscultation bilaterally, without wheezing, rales, or rhonchi.  Abdomen:  Bowel sounds present, soft, non-distended. Moderate TTP supraumbilical with noted hernia/knott. No HSM or hernias noted. No rebound or guarding. No masses appreciated  Msk:  Symmetrical without gross deformities. Pulses:  Normal bilateral DP pulses noted. Extremities:  Without clubbing or edema. Neurologic:  Alert and  oriented x4;  grossly normal neurologically. Psych:  Alert and cooperative. Normal mood and affect.  Intake/Output from previous day: 05/20 0701 - 05/21 0700 In: 220 [P.O.:120; IV Piggyback:100] Out: 3001 [Stool:1] Intake/Output this shift: No intake/output data recorded.  Lab Results: Recent Labs    07/19/19 1104 07/20/19 0447 07/21/19 0510  WBC 4.4 3.2* 2.2*  HGB 8.9* 8.0* 8.1*  HCT 29.6* 27.0* 27.0*  PLT 117* 105* 100*   BMET Recent Labs    07/19/19 1104 07/20/19 0447 07/21/19 0510  NA 137 136 136  K 4.6 4.8  4.1  CL 94* 95* 96*  CO2 27 26 27   GLUCOSE 87 80 67*  BUN 59* 64* 27*  CREATININE 9.29* 9.70* 5.81*  CALCIUM 9.1 9.1 9.0   LFT Recent Labs    07/19/19 1104 07/21/19 0510  PROT 7.9 6.7  ALBUMIN 3.4* 2.8*  AST 15 13*  ALT 12 10  ALKPHOS 114 100  BILITOT 0.6 0.6   PT/INR No results for input(s): LABPROT, INR in the last 72 hours. Hepatitis Panel Recent Labs    07/20/19 0916  HEPBSAG NON REACTIVE     Studies/Results: CT SOFT TISSUE NECK W CONTRAST  Result Date: 07/19/2019 CLINICAL DATA:  Initial evaluation for shortness of breath, difficulty with breathing. EXAM: CT NECK WITH CONTRAST TECHNIQUE: Multidetector CT imaging of the neck was performed using the standard protocol following the bolus administration of intravenous contrast. CONTRAST:  32mL OMNIPAQUE IOHEXOL 300 MG/ML  SOLN COMPARISON:  Comparison made with prior neck CT from 08/24/2002. FINDINGS: Pharynx and larynx: Examination somewhat technically limited by patient positioning. Oral cavity within normal limits without discrete mass or collection. Patient is edentulous. No base of tongue lesion. Oropharynx within normal limits. No evidence for tonsillitis or acute peritonsillar abscess. Mild asymmetric fullness at the right nasopharynx favored to be related to positioning. No discrete mass identified. No retropharyngeal collection or swelling. Epiglottis within normal limits. Vallecula clear. Remainder of the hypopharynx and supraglottic larynx within normal limits. True cords fairly symmetric and normal. Subglottic airway largely clear. Salivary glands: Salivary glands including the parotid and submandibular glands are within normal  limits. Thyroid: Few subcentimeter dystrophic calcifications noted within the thyroid. Thyroid otherwise unremarkable. Lymph nodes: No pathologically enlarged lymph nodes seen within the neck. Mildly prominent 6 mm left level V node with surrounding inflammatory stranding noted (series 6, image 54),  indeterminate, but could be reactive in nature. Vascular: Moderate atherosclerotic change present about the aortic arch, carotid bifurcations, and carotid siphons. Diffuse tortuosity the major arterial vasculature of the neck suggesting chronic underlying hypertension. Both carotid artery systems partially medialized into the retropharyngeal space. Vascular stent in place within the distal right subclavian vein as it dumps into the SVC. Patent flow seen through the stent. 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 Limited intracranial: Unremarkable. Visualized orbits: Globes and orbital soft tissues within normal limits. Mastoids and visualized paranasal sinuses: Visualized paranasal sinuses are clear. Visualized mastoids and middle ear cavities are well pneumatized and free of fluid. Skeleton: No acute osseous abnormality. Bones are diffusely sclerotic with somewhat permeative appearance, consistent with underlying renal osteodystrophy. Cervicothoracic scoliotic curvature with underlying moderate multilevel degenerative spondylosis, most pronounced at C4-5 through C6-7. At [facility] axial fusion noted. Prominent degenerative changes noted about the TMJs bilaterally. Upper chest: Hazy fat stranding within the visualized right chest wall and axillary region favored to be related to vascular congestion. Visualized upper chest demonstrates no other acute abnormality. Partially visualized lungs are largely clear. Mild paraseptal emphysema. Other: None. IMPRESSION: 1. Mildly prominent 6 mm left level V node with surrounding inflammatory stranding, indeterminate, but could be reactive in nature. 2. No other acute abnormality within the neck. 3. Diffuse tortuosity the major arterial vasculature of the neck suggesting chronic underlying hypertension. 4. Vascular stent in place within the distal right subclavian vein with patent flow. Multiple  prominent venous collaterals throughout the neck and upper chest wall. 5. Cervicothoracic scoliotic curvature with underlying moderate multilevel degenerative spondylosis, most pronounced at C4-5 through C6-7. 6. Diffuse sclerosis with somewhat permeative appearance of the bones, most likely related to underlying renal osteodystrophy. 7. Aortic Atherosclerosis (ICD10-I70.0) and Emphysema (ICD10-J43.9). Electronically Signed   By: Jeannine Boga M.D.   On: 07/19/2019 21:19   CT ABDOMEN PELVIS W CONTRAST  Result Date: 07/19/2019 CLINICAL DATA:  Chronic pancytopenia, abdominal pain. Possible hernia EXAM: CT ABDOMEN AND PELVIS WITH CONTRAST TECHNIQUE: Multidetector CT imaging of the abdomen and pelvis was performed using the standard protocol following bolus administration of intravenous contrast. CONTRAST:  121mL OMNIPAQUE IOHEXOL 300 MG/ML  SOLN COMPARISON:  10/07/2011 FINDINGS: Lower chest: Cardiomegaly. Chronic skin thickening and edematous appearance of the visualized right breast, similar in appearance to prior CT 10/07/2011. Visualized lung bases are clear. Hepatobiliary: No focal liver lesion. Small layering hyperdensity within the gallbladder lumen likely reflecting a small stone. The common bile duct is dilated measuring 15 mm the level of the pancreatic head (previously measured 9 mm). No discrete stone within the common bile duct. Pancreas: The main pancreatic duct is dilated measuring up to 8 mm, which is new from prior. No discrete pancreatic lesion. No peripancreatic inflammatory changes. Spleen: Stable mild splenomegaly. Adrenals/Urinary Tract: Unremarkable adrenal glands. Atrophic kidneys with innumerable predominantly low-density lesions likely reflecting cystic disease of dialysis. Urinary bladder is largely decompressed, limiting its evaluation. Stomach/Bowel: The bowel is well distended with oral contrast. There is prominent circumferential bowel wall thickening involving the cecum,  ascending colon, and proximal transverse colon. Appendix not definitively visualized. Scattered sigmoid diverticulosis. No bowel obstruction. Stomach appears within normal limits. Vascular/Lymphatic: Aortoiliac atherosclerosis without aneurysm.  No abdominopelvic lymphadenopathy is visualized. Reproductive: Status post hysterectomy. No adnexal masses. Other: No ascites. No abdominopelvic fluid collection. No pneumoperitoneum. Previously seen small 12 x 9 mm supraumbilical abdominal wall hernia is now hyperattenuating with stranding within the hernia sac (series 7, image 38). Musculoskeletal: Diffusely abnormal sclerotic appearance of the osseous structures compatible with chronic renal osteodystrophy. No acute fracture identified. IMPRESSION: 1. Prominent circumferential bowel wall thickening involving the cecum, ascending colon, and proximal transverse colon compatible with a infectious or inflammatory colitis. 2. Small 1.2 cm supraumbilical abdominal wall hernia previously containing only fat with new increased density/stranding within the hernia sac concerning for strangulation. 3. Interval dilation of the common bile duct and main pancreatic duct without definitive obstructive etiology. Further evaluation with MRI/MRCP is recommended. Non-emergent MRI should be deferred until patient has been discharged for the acute illness, and can optimally cooperate with positioning and breath-holding instructions. 4. Chronic skin thickening and edematous changes to the visualized right breast, similar to prior CT 2013. 5. Additional chronic findings, as above. Electronically Signed   By: Davina Poke D.O.   On: 07/19/2019 15:19   DG CHEST PORT 1 VIEW  Result Date: 07/19/2019 CLINICAL DATA:  69 year old with shortness of breath. Current history of HIV, hypertension, pulmonary hypertension and atrial flutter. Former smoker. Negative COVID-19 test earlier today. EXAM: PORTABLE CHEST 1 VIEW COMPARISON:  07/23/2017.  FINDINGS: Cardiac silhouette moderately enlarged, unchanged. Thoracic aorta atherosclerotic, unchanged. Massive enlargement of the central pulmonary arteries, even more so than on the examination 2 years ago. Patchy airspace opacities with air bronchograms medially in the LEFT lung base and to a lesser degree medially in the RIGHT lung base. Lungs otherwise clear. Pulmonary vascularity normal without evidence of pulmonary edema. No visible pleural effusions. RIGHT subclavian vein stent. Surgical clips in the low RIGHT axilla/axillary tail of the RIGHT breast. IMPRESSION: 1. Bibasilar pneumonia, LEFT greater than RIGHT. 2. Stable cardiomegaly without evidence of pulmonary edema. 3. Massive enlargement of the central pulmonary arteries indicating pulmonary arterial hypertension, with interval increase in size of the pulmonary arteries since the examination 2 years ago. Electronically Signed   By: Evangeline Dakin M.D.   On: 07/19/2019 20:57    Assessment: Pleasant 69 y/o female with multiple comorbidities as outlined in consult, sent to ED from hematology appointment yesterday with one week history of abdominal pain at site of hernia. Patient with recent acute on chronic diarrhea as well. Diagnosed with bibasilar pneumonia with hypoxia not on antibiotic therapy.   Abdominal pain- CT with prominent circumferential bowel wall thickening involving the cecum, ascending colon, proximal colon as well as 12 x 9 mm supraumbilical abdominal wall hernia with stranding but does not contain bowel.  Evaluated by surgery who did not advise surgical intervention at this time.  Patient also had evidence of cholelithiasis with dilated common bile duct of 15 mm, pancreatic duct dilated at 8 mm without identifiable cause. LFTs remain normal this morning.  Pain seems somewhat improved today, still tender to palpation supraumbilical.  Acute on chronic diarrhea- Colitis noted on current CT.  Patient denies recent antibiotic use  but she is somewhat of a poor historian so question reliability.  No prior colonoscopy that she can remember.  Some days has 4-5 stools daily.  Complains of nocturnal diarrhea.  She is unsure if she has any blood in the stool reporting that she does not look at it.  Stool studies have been ordered but she has had no diarrhea in the past 72 hours.  GI Path panel collected and pending; CDiff was cancelled due to formed stool submitted.However, she did have a bowel movement last night but it was soft/formed. CMP this morning with stable electrolytes.  Depending on clinical course, may need colonoscopy as an inpatient.  Otherwise, plan outpatient colonoscopy.   Anemia in setting of pancytopenia and ESRD-  No evidence of IDA on anemia profile. No prior colonoscopy. Remote EGD in 2012. No overt GI bleeding. Hgb stable this morning, plt stable this morning. WBC count decreased to 2.2. HIV RNA still pending.   Plan: 1. Consider MRCP (no contrast) to further evaluate common bile duct as an outpatient.  May need CT pancreatic protocol to further evaluate pancreatic duct dilation as opposed to MRI with contrast due to her ESRD. This can be done as outpatient.  2. Follow-up for GI pathogen panel results. 3. Consider reordering C. difficile if any further diarrhea.   4. She needs a colonoscopy timing based on clinical course. If improves, would elect for outpatient colonoscopy in 6-8 weeks. If persisting diarrhea, may need inpatient colonoscopy 5. Supportive measures   Thank you for allowing [place] to participate in the care of [place], DNP, AGNP-C Adult & Gerontological Nurse Practitioner [facility]    LOS: 2 days    07/21/2019, 9:30 AM

## 2019-07-21 NOTE — Progress Notes (Signed)
Patient ID: [name], female   DOB: 08-Mar-1950, 69 y.o.   MRN: [id number] S: Doesn't feel well today.   O:BP (!) 100/50 (BP Location: Left Arm)   Pulse (!) 55   Temp 98.4 F (36.9 C) (Oral)   Resp 12   Ht 5\' 2"  (1.575 m)   Wt 46.5 kg   SpO2 98%   BMI 18.75 kg/m   Intake/Output Summary (Last 24 hours) at 07/21/2019 1046 Last data filed at 07/21/2019 0900 Gross per 24 hour  Intake 460 ml  Output 3001 ml  Net -2541 ml   Intake/Output: I/O last 3 completed shifts: In: 784.3 [P.O.:120; I.V.:216.5; IV Piggyback:447.8] Out: 3001 [Other:3000; Stool:1]  Intake/Output this shift:  Total I/O In: 240 [P.O.:240] Out: -  Weight change: -0.6 kg Gen: frail, chronically ill-appearing in NAD CVS: bradycardic at 55, no rub Resp: cta Abd: hypoactive BS, tense, distended, + tenderness to palpation Ext: no edema, RUE AVF +T/B  Recent Labs  Lab 07/19/19 1104 07/20/19 0447 07/21/19 0510  NA 137 136 136  K 4.6 4.8 4.1  CL 94* 95* 96*  CO2 27 26 27   GLUCOSE 87 80 67*  BUN 59* 64* 27*  CREATININE 9.29* 9.70* 5.81*  ALBUMIN 3.4*  --  2.8*  CALCIUM 9.1 9.1 9.0  AST 15  --  13*  ALT 12  --  10   Liver Function Tests: Recent Labs  Lab 07/19/19 1104 07/21/19 0510  AST 15 13*  ALT 12 10  ALKPHOS 114 100  BILITOT 0.6 0.6  PROT 7.9 6.7  ALBUMIN 3.4* 2.8*   No results for input(s): LIPASE, AMYLASE in the last 168 hours. No results for input(s): AMMONIA in the last 168 hours. CBC: Recent Labs  Lab 07/19/19 1104 07/20/19 0447 07/21/19 0510  WBC 4.4 3.2* 2.2*  NEUTROABS 2.8  --   --   HGB 8.9* 8.0* 8.1*  HCT 29.6* 27.0* 27.0*  MCV 115.2* 114.4* 114.9*  PLT 117* 105* 100*   Cardiac Enzymes: No results for input(s): CKTOTAL, CKMB, CKMBINDEX, TROPONINI in the last 168 hours. CBG: Recent Labs  Lab 07/20/19 1105 07/20/19 2042 07/21/19 0009 07/21/19 0106 07/21/19 0737  GLUCAP 74 72 62* 120* 70    Iron Studies: No results for input(s): IRON, TIBC, TRANSFERRIN,  FERRITIN in the last 72 hours. Studies/Results: CT SOFT TISSUE NECK W CONTRAST  Result Date: 07/19/2019 CLINICAL DATA:  Initial evaluation for shortness of breath, difficulty with breathing. EXAM: CT NECK WITH CONTRAST TECHNIQUE: Multidetector CT imaging of the neck was performed using the standard protocol following the bolus administration of intravenous contrast. CONTRAST:  38mL OMNIPAQUE IOHEXOL 300 MG/ML  SOLN COMPARISON:  Comparison made with prior neck CT from 08/24/2002. FINDINGS: Pharynx and larynx: Examination somewhat technically limited by patient positioning. Oral cavity within normal limits without discrete mass or collection. Patient is edentulous. No base of tongue lesion. Oropharynx within normal limits. No evidence for tonsillitis or acute peritonsillar abscess. Mild asymmetric fullness at the right nasopharynx favored to be related to positioning. No discrete mass identified. No retropharyngeal collection or swelling. Epiglottis within normal limits. Vallecula clear. Remainder of the hypopharynx and supraglottic larynx within normal limits. True cords fairly symmetric and normal. Subglottic airway largely clear. Salivary glands: Salivary glands including the parotid and submandibular glands are within normal limits. Thyroid: Few subcentimeter dystrophic calcifications noted within the thyroid. Thyroid otherwise unremarkable. Lymph nodes: No pathologically enlarged lymph nodes seen within the neck. Mildly prominent 6 mm left level V node  with surrounding inflammatory stranding noted (series 6, image 54), indeterminate, but could be reactive in nature. Vascular: Moderate atherosclerotic change present about the aortic arch, carotid bifurcations, and carotid siphons. Diffuse tortuosity the major arterial vasculature of the neck suggesting chronic underlying hypertension. Both carotid artery systems partially medialized into the retropharyngeal space. Vascular stent in place within the distal  right subclavian vein as it dumps into the SVC. Patent flow seen through the stent. 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 Limited intracranial: Unremarkable. Visualized orbits: Globes and orbital soft tissues within normal limits. Mastoids and visualized paranasal sinuses: Visualized paranasal sinuses are clear. Visualized mastoids and middle ear cavities are well pneumatized and free of fluid. Skeleton: No acute osseous abnormality. Bones are diffusely sclerotic with somewhat permeative appearance, consistent with underlying renal osteodystrophy. Cervicothoracic scoliotic curvature with underlying moderate multilevel degenerative spondylosis, most pronounced at C4-5 through C6-7. At [facility] axial fusion noted. Prominent degenerative changes noted about the TMJs bilaterally. Upper chest: Hazy fat stranding within the visualized right chest wall and axillary region favored to be related to vascular congestion. Visualized upper chest demonstrates no other acute abnormality. Partially visualized lungs are largely clear. Mild paraseptal emphysema. Other: None. IMPRESSION: 1. Mildly prominent 6 mm left level V node with surrounding inflammatory stranding, indeterminate, but could be reactive in nature. 2. No other acute abnormality within the neck. 3. Diffuse tortuosity the major arterial vasculature of the neck suggesting chronic underlying hypertension. 4. Vascular stent in place within the distal right subclavian vein with patent flow. Multiple prominent venous collaterals throughout the neck and upper chest wall. 5. Cervicothoracic scoliotic curvature with underlying moderate multilevel degenerative spondylosis, most pronounced at C4-5 through C6-7. 6. Diffuse sclerosis with somewhat permeative appearance of the bones, most likely related to underlying renal osteodystrophy. 7. Aortic Atherosclerosis (ICD10-I70.0) and Emphysema  (ICD10-J43.9). Electronically Signed   By: Jeannine Boga M.D.   On: 07/19/2019 21:19   CT ABDOMEN PELVIS W CONTRAST  Result Date: 07/19/2019 CLINICAL DATA:  Chronic pancytopenia, abdominal pain. Possible hernia EXAM: CT ABDOMEN AND PELVIS WITH CONTRAST TECHNIQUE: Multidetector CT imaging of the abdomen and pelvis was performed using the standard protocol following bolus administration of intravenous contrast. CONTRAST:  173mL OMNIPAQUE IOHEXOL 300 MG/ML  SOLN COMPARISON:  10/07/2011 FINDINGS: Lower chest: Cardiomegaly. Chronic skin thickening and edematous appearance of the visualized right breast, similar in appearance to prior CT 10/07/2011. Visualized lung bases are clear. Hepatobiliary: No focal liver lesion. Small layering hyperdensity within the gallbladder lumen likely reflecting a small stone. The common bile duct is dilated measuring 15 mm the level of the pancreatic head (previously measured 9 mm). No discrete stone within the common bile duct. Pancreas: The main pancreatic duct is dilated measuring up to 8 mm, which is new from prior. No discrete pancreatic lesion. No peripancreatic inflammatory changes. Spleen: Stable mild splenomegaly. Adrenals/Urinary Tract: Unremarkable adrenal glands. Atrophic kidneys with innumerable predominantly low-density lesions likely reflecting cystic disease of dialysis. Urinary bladder is largely decompressed, limiting its evaluation. Stomach/Bowel: The bowel is well distended with oral contrast. There is prominent circumferential bowel wall thickening involving the cecum, ascending colon, and proximal transverse colon. Appendix not definitively visualized. Scattered sigmoid diverticulosis. No bowel obstruction. Stomach appears within normal limits. Vascular/Lymphatic: Aortoiliac atherosclerosis without aneurysm. No abdominopelvic lymphadenopathy is visualized. Reproductive: Status post hysterectomy. No adnexal masses. Other: No ascites. No abdominopelvic fluid  collection. No pneumoperitoneum. Previously seen small 12 x 9 mm supraumbilical abdominal wall hernia  is now hyperattenuating with stranding within the hernia sac (series 7, image 38). Musculoskeletal: Diffusely abnormal sclerotic appearance of the osseous structures compatible with chronic renal osteodystrophy. No acute fracture identified. IMPRESSION: 1. Prominent circumferential bowel wall thickening involving the cecum, ascending colon, and proximal transverse colon compatible with a infectious or inflammatory colitis. 2. Small 1.2 cm supraumbilical abdominal wall hernia previously containing only fat with new increased density/stranding within the hernia sac concerning for strangulation. 3. Interval dilation of the common bile duct and main pancreatic duct without definitive obstructive etiology. Further evaluation with MRI/MRCP is recommended. Non-emergent MRI should be deferred until patient has been discharged for the acute illness, and can optimally cooperate with positioning and breath-holding instructions. 4. Chronic skin thickening and edematous changes to the visualized right breast, similar to prior CT 2013. 5. Additional chronic findings, as above. Electronically Signed   By: Davina Poke D.O.   On: 07/19/2019 15:19   DG CHEST PORT 1 VIEW  Result Date: 07/19/2019 CLINICAL DATA:  69 year old with shortness of breath. Current history of HIV, hypertension, pulmonary hypertension and atrial flutter. Former smoker. Negative COVID-19 test earlier today. EXAM: PORTABLE CHEST 1 VIEW COMPARISON:  07/23/2017. FINDINGS: Cardiac silhouette moderately enlarged, unchanged. Thoracic aorta atherosclerotic, unchanged. Massive enlargement of the central pulmonary arteries, even more so than on the examination 2 years ago. Patchy airspace opacities with air bronchograms medially in the LEFT lung base and to a lesser degree medially in the RIGHT lung base. Lungs otherwise clear. Pulmonary vascularity normal  without evidence of pulmonary edema. No visible pleural effusions. RIGHT subclavian vein stent. Surgical clips in the low RIGHT axilla/axillary tail of the RIGHT breast. IMPRESSION: 1. Bibasilar pneumonia, LEFT greater than RIGHT. 2. Stable cardiomegaly without evidence of pulmonary edema. 3. Massive enlargement of the central pulmonary arteries indicating pulmonary arterial hypertension, with interval increase in size of the pulmonary arteries since the examination 2 years ago. Electronically Signed   By: Evangeline Dakin M.D.   On: 07/19/2019 20:57   . calcium acetate  2,001 mg Oral TID WC  . Chlorhexidine Gluconate Cloth  6 each Topical Q0600  . dolutegravir  50 mg Oral Daily  . fentaNYL  1 patch Transdermal Q72H  . heparin  5,000 Units Subcutaneous Q8H  . lamiVUDine  50 mg Oral Daily  . mirtazapine  15 mg Oral QHS  . multivitamin  1 tablet Oral QHS  . sertraline  125 mg Oral QHS  . sulfamethoxazole-trimethoprim  1 tablet Oral Q T,Th,Sa-HD  . traZODone  50 mg Oral QHS  . zidovudine  100 mg Oral TID    BMET    Component Value Date/Time   NA 136 07/21/2019 0510   K 4.1 07/21/2019 0510   CL 96 (L) 07/21/2019 0510   CO2 27 07/21/2019 0510   GLUCOSE 67 (L) 07/21/2019 0510   BUN 27 (H) 07/21/2019 0510   CREATININE 5.81 (H) 07/21/2019 0510   CREATININE 7.19 (H) 10/19/2017 1419   CALCIUM 9.0 07/21/2019 0510   CALCIUM 9.0 03/12/2011 0527   GFRNONAA 7 (L) 07/21/2019 0510   GFRNONAA 5 (L) 02/12/2015 1000   GFRAA 8 (L) 07/21/2019 0510   GFRAA 5 (L) 02/12/2015 1000   CBC    Component Value Date/Time   WBC 2.2 (L) 07/21/2019 0510   RBC 2.35 (L) 07/21/2019 0510   HGB 8.1 (L) 07/21/2019 0510   HCT 27.0 (L) 07/21/2019 0510   PLT 100 (L) 07/21/2019 0510   MCV 114.9 (H) 07/21/2019 0510  MCH 34.5 (H) 07/21/2019 0510   MCHC 30.0 07/21/2019 0510   RDW 13.8 07/21/2019 0510   LYMPHSABS 1.1 07/19/2019 1104   MONOABS 0.4 07/19/2019 1104   EOSABS 0.1 07/19/2019 1104   BASOSABS 0.0  07/19/2019 1104    Dialysis Orders: Center:  [facility]  on TTS . EDW 46kg HD Bath 3K/2.5Ca  Time 3 hours Heparin 1000 bolus and 500 units/hr. Access RUE AVF BFR 350 DFR 600     Assessment/Plan: 1.  Diarrhea/colitis- C diff collected as well as stool pathogen panel.  On IV zosyn per primary 2. Bibasilar lobar pna- on azithromycin per primary 3.  ESRD -   Will plan on HD tomorrow to keep on TTS schedule 4.  Hypertension/volume  -  tolerated 3 liters UF with HD yesterday. 5.  Anemia  - Hgb dropped to 8.  Continue to follow and transfuse prn.  Will give dose of aranesp today. 6.  Metabolic bone disease -   Cont with binders 7.  Nutrition - renal diet when able to take regular food, now on clear liquid diet per primary.   Donetta Potts, MD [name] [phone]

## 2019-07-21 NOTE — Progress Notes (Signed)
PROGRESS NOTE  [name] YSA:[id number] DOB: November 16, 1950 DOA: 07/19/2019 PCP: Hilbert Corrigan, MD    Brief History:  69 year old female with a history of HIV, atrial fibrillation, ESRD (TTS), hypertension, TIA, thrombocytopenia presenting with 1 week history of abdominal pain, loose stools, and fevers and chills.  The patient is also been complaining of shortness of breath of unclear duration.  She is a difficult historian at best.  She denies any hematochezia or melena.  She has not had any vomiting but has had some nausea.  She is not able to clarify exacerbating or alleviating factors frequent with regards to her shortness of breath.  She denies any coughing or hemoptysis.  She is a resident of [facility].  The patient states that she has had 3-4 loose bowel movements on a daily basis but has not had any hematochezia or melena.  She describes her pain as moderate that is periumbilical and left lower quadrant area. In the emergency department, the patient was afebrile hemodynamically stable with oxygen saturation 96% on room air.  WBC 4.4, hemoglobin 8.9, platelets 117,000.  CT of the abdomen and pelvis showed circumferential wall thickening of the cecum, ascending colon, proximal transverse colon.  There was also a small supraumbilical hernia.  There was dilatation of the common bile duct and main pancreatic duct.  Chest x-ray showed bibasilar opacities, left greater than right.  Assessment/Plan: Diarrhea/colitis -Broaden antibiotic coverage to Zosyn which will cover her pneumonia -GI consult appreciated-->outpt colonoscopy -Collect C. Difficile -Stool pathogen panel-pending -advance diet  Lobar pneumonia -Personally reviewed chest x-ray--bibasilar opacities, left greater than right -Continue azithromycin -Broaden antibiotic coverage with Merrem (PCN allergy) given the patient's HIV status and nursing home residence  Acute respiratory failure with  hypoxia -Currently stable on 3 L>>>RA -Secondary to pneumonia -Wean oxygen as tolerated for saturation greater 92%  Dilated common bile duct and pancreatic duct -LFTs and bilirubin are normal -GI consult appreciated-->outpt colonoscopy -MRCP as oupt; may need CT pancreatic protocol  ESRD -Nephrology consult for maintenance dialysis  HIV -HIV RNA--pending -CD4 count--169/22% -start Bactrim DS thrice per week -Continue dolutegravir, lamivudine, and zidovudine  Thrombocytopenia -This appears to be chronic -Monitor for signs of bleeding  Depression/anxiety -Continue Remeron and Zoloft  Chronic pain syndrome -Continue fentanyl transdermal patch       Status is: Inpatient  Remains inpatient appropriate because:need for IV abx, new oxygen demand, GI work up   Dispo: The patient is from: SNF  Anticipated d/c is to: SNF  Anticipated d/c date is: 07/22/19 if stable  Patient currently is not medically stable to d/c.         Family Communication:   No Family at bedside  Consultants:GI, renal    Code Status:  FULL   DVT Prophylaxis:  [place] Heparin   Procedures: As Listed in Progress Note Above  Antibiotics: merrem 5/20>>> azithro 5/19>>      Subjective: Pt states abd pain is improving.  She wants solid food.  She denies f/c, cp, sob, n/v.  She had 2 BMs in last 24 hours without hematochezia  Objective: Vitals:   07/20/19 2038 07/21/19 0505 07/21/19 0900 07/21/19 1354  BP: (!) 130/57 (!) 100/50  (!) 104/58  Pulse: 64 (!) 55  68  Resp: 20 12  18   Temp: 99.5 F (37.5 C) 98.4 F (36.9 C)  98.6 F (37 C)  TempSrc: Oral Oral    SpO2: 100% 100% 98% 100%  Weight:      Height:        Intake/Output Summary (Last 24 hours) at 07/21/2019 1412 Last data filed at 07/21/2019 0900 Gross per 24 hour  Intake 340 ml  Output 3000 ml  Net -2660 ml   Weight change: -0.6 kg Exam:   General:   Pt is alert, follows commands appropriately, not in acute distress  HEENT: No icterus, No thrush, No neck mass, [place]/AT  Cardiovascular: RRR, S1/S2, no rubs, no gallops  Respiratory: bibasilar rales, no wheeze  Abdomen: Soft/+BS, mild upper tender, non distended, no guarding  Extremities: No edema, No lymphangitis, No petechiae, No rashes, no synovitis   Data Reviewed: I have personally reviewed following labs and imaging studies Basic Metabolic Panel: Recent Labs  Lab 07/19/19 1104 07/20/19 0447 07/21/19 0510  NA 137 136 136  K 4.6 4.8 4.1  CL 94* 95* 96*  CO2 27 26 27   GLUCOSE 87 80 67*  BUN 59* 64* 27*  CREATININE 9.29* 9.70* 5.81*  CALCIUM 9.1 9.1 9.0   Liver Function Tests: Recent Labs  Lab 07/19/19 1104 07/21/19 0510  AST 15 13*  ALT 12 10  ALKPHOS 114 100  BILITOT 0.6 0.6  PROT 7.9 6.7  ALBUMIN 3.4* 2.8*   No results for input(s): LIPASE, AMYLASE in the last 168 hours. No results for input(s): AMMONIA in the last 168 hours. Coagulation Profile: No results for input(s): INR, PROTIME in the last 168 hours. CBC: Recent Labs  Lab 07/19/19 1104 07/20/19 0447 07/21/19 0510  WBC 4.4 3.2* 2.2*  NEUTROABS 2.8  --   --   HGB 8.9* 8.0* 8.1*  HCT 29.6* 27.0* 27.0*  MCV 115.2* 114.4* 114.9*  PLT 117* 105* 100*   Cardiac Enzymes: No results for input(s): CKTOTAL, CKMB, CKMBINDEX, TROPONINI in the last 168 hours. BNP: Invalid input(s): POCBNP CBG: Recent Labs  Lab 07/20/19 2042 07/21/19 0009 07/21/19 0106 07/21/19 0737 07/21/19 1133  GLUCAP 72 62* 120* 70 67*   HbA1C: No results for input(s): HGBA1C in the last 72 hours. Urine analysis:    Component Value Date/Time   COLORURINE YELLOW 01/19/2009 1855   APPEARANCEUR CLEAR 01/19/2009 1855   LABSPEC 1.015 01/19/2009 1855   PHURINE 7.5 01/19/2009 1855   GLUCOSEU NEGATIVE 01/19/2009 1855   HGBUR SMALL (A) 01/19/2009 1855   BILIRUBINUR NEGATIVE 01/19/2009 1855   KETONESUR NEGATIVE 01/19/2009 1855    PROTEINUR 100 (A) 01/19/2009 1855   UROBILINOGEN 0.2 01/19/2009 1855   NITRITE NEGATIVE 01/19/2009 1855   LEUKOCYTESUR NEGATIVE 01/19/2009 1855   Sepsis Labs: @LABRCNTIP (procalcitonin:4,lacticidven:4) ) Recent Results (from the past 240 hour(s))  SARS Coronavirus 2 by RT PCR (hospital order, performed in [facility] [place] hospital lab) Nasopharyngeal Nasopharyngeal Swab     Status: None   Collection Time: 07/19/19  3:43 PM   Specimen: Nasopharyngeal Swab  Result Value Ref Range Status   SARS Coronavirus 2 NEGATIVE NEGATIVE Final    Comment: (NOTE) SARS-CoV-2 target nucleic acids are NOT DETECTED. The SARS-CoV-2 RNA is generally detectable in upper and lower respiratory specimens during the acute phase of infection. The lowest concentration of SARS-CoV-2 viral copies this assay can detect is 250 copies / mL. A negative result does not preclude SARS-CoV-2 infection and should not be used as the sole basis for treatment or other patient management decisions.  A negative result may occur with improper specimen collection / handling, submission of specimen other than nasopharyngeal swab, presence of viral mutation(s) within the areas targeted by this assay, and  inadequate number of viral copies (<250 copies / mL). A negative result must be combined with clinical observations, patient history, and epidemiological information. Fact Sheet for Patients:   StrictlyIdeas.no Fact Sheet for Healthcare Providers: BankingDealers.co.za This test is not yet approved or cleared  by the [place] FDA and has been authorized for detection and/or diagnosis of SARS-CoV-2 by FDA under an Emergency Use Authorization (EUA).  This EUA will remain in effect (meaning this test can be used) for the duration of the COVID-19 declaration under Section 564(b)(1) of the Act, 21 U.S.C. section 360bbb-3(b)(1), unless the authorization is terminated or revoked  sooner. Performed at [facility], [address] [place], [place] [address]   MRSA PCR Screening     Status: None   Collection Time: 07/19/19  8:26 PM   Specimen: Nasopharyngeal  Result Value Ref Range Status   MRSA by PCR NEGATIVE NEGATIVE Final    Comment:        The GeneXpert MRSA Assay (FDA approved for NASAL specimens only), is one component of a comprehensive MRSA colonization surveillance program. It is not intended to diagnose MRSA infection nor to guide or monitor treatment for MRSA infections. Performed at [facility], [address] [place], [place] [address]      Scheduled Meds:  calcium acetate  2,001 mg Oral TID WC   Chlorhexidine Gluconate Cloth  6 each Topical Q0600   dolutegravir  50 mg Oral Daily   fentaNYL  1 patch Transdermal Q72H   heparin  5,000 Units Subcutaneous Q8H   lamiVUDine  50 mg Oral Daily   mirtazapine  15 mg Oral QHS   multivitamin  1 tablet Oral QHS   sertraline  125 mg Oral QHS   sulfamethoxazole-trimethoprim  1 tablet Oral Q T,Th,Sa-HD   traZODone  50 mg Oral QHS   zidovudine  100 mg Oral TID   Continuous Infusions:  sodium chloride     sodium chloride     azithromycin 500 mg (07/21/19 0000)   meropenem (MERREM) IV 500 mg (07/20/19 2143)    Procedures/Studies: CT SOFT TISSUE NECK W CONTRAST  Result Date: 07/19/2019 CLINICAL DATA:  Initial evaluation for shortness of breath, difficulty with breathing. EXAM: CT NECK WITH CONTRAST TECHNIQUE: Multidetector CT imaging of the neck was performed using the standard protocol following the bolus administration of intravenous contrast. CONTRAST:  10mL OMNIPAQUE IOHEXOL 300 MG/ML  SOLN COMPARISON:  Comparison made with prior neck CT from 08/24/2002. FINDINGS: Pharynx and larynx: Examination somewhat technically limited by patient positioning. Oral cavity within normal limits without discrete mass or collection. Patient is edentulous. No base of tongue lesion. Oropharynx within  normal limits. No evidence for tonsillitis or acute peritonsillar abscess. Mild asymmetric fullness at the right nasopharynx favored to be related to positioning. No discrete mass identified. No retropharyngeal collection or swelling. Epiglottis within normal limits. Vallecula clear. Remainder of the hypopharynx and supraglottic larynx within normal limits. True cords fairly symmetric and normal. Subglottic airway largely clear. Salivary glands: Salivary glands including the parotid and submandibular glands are within normal limits. Thyroid: Few subcentimeter dystrophic calcifications noted within the thyroid. Thyroid otherwise unremarkable. Lymph nodes: No pathologically enlarged lymph nodes seen within the neck. Mildly prominent 6 mm left level V node with surrounding inflammatory stranding noted (series 6, image 54), indeterminate, but could be reactive in nature. Vascular: Moderate atherosclerotic change present about the aortic arch, carotid bifurcations, and carotid siphons. Diffuse tortuosity the major arterial vasculature of the neck suggesting chronic underlying hypertension.  Both carotid artery systems partially medialized into the retropharyngeal space. Vascular stent in place within the distal right subclavian vein as it dumps into the SVC. Patent flow seen through the stent. 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 Limited intracranial: Unremarkable. Visualized orbits: Globes and orbital soft tissues within normal limits. Mastoids and visualized paranasal sinuses: Visualized paranasal sinuses are clear. Visualized mastoids and middle ear cavities are well pneumatized and free of fluid. Skeleton: No acute osseous abnormality. Bones are diffusely sclerotic with somewhat permeative appearance, consistent with underlying renal osteodystrophy. Cervicothoracic scoliotic curvature with underlying moderate multilevel degenerative  spondylosis, most pronounced at C4-5 through C6-7. At [facility] axial fusion noted. Prominent degenerative changes noted about the TMJs bilaterally. Upper chest: Hazy fat stranding within the visualized right chest wall and axillary region favored to be related to vascular congestion. Visualized upper chest demonstrates no other acute abnormality. Partially visualized lungs are largely clear. Mild paraseptal emphysema. Other: None. IMPRESSION: 1. Mildly prominent 6 mm left level V node with surrounding inflammatory stranding, indeterminate, but could be reactive in nature. 2. No other acute abnormality within the neck. 3. Diffuse tortuosity the major arterial vasculature of the neck suggesting chronic underlying hypertension. 4. Vascular stent in place within the distal right subclavian vein with patent flow. Multiple prominent venous collaterals throughout the neck and upper chest wall. 5. Cervicothoracic scoliotic curvature with underlying moderate multilevel degenerative spondylosis, most pronounced at C4-5 through C6-7. 6. Diffuse sclerosis with somewhat permeative appearance of the bones, most likely related to underlying renal osteodystrophy. 7. Aortic Atherosclerosis (ICD10-I70.0) and Emphysema (ICD10-J43.9). Electronically Signed   By: Jeannine Boga M.D.   On: 07/19/2019 21:19   CT ABDOMEN PELVIS W CONTRAST  Result Date: 07/19/2019 CLINICAL DATA:  Chronic pancytopenia, abdominal pain. Possible hernia EXAM: CT ABDOMEN AND PELVIS WITH CONTRAST TECHNIQUE: Multidetector CT imaging of the abdomen and pelvis was performed using the standard protocol following bolus administration of intravenous contrast. CONTRAST:  182mL OMNIPAQUE IOHEXOL 300 MG/ML  SOLN COMPARISON:  10/07/2011 FINDINGS: Lower chest: Cardiomegaly. Chronic skin thickening and edematous appearance of the visualized right breast, similar in appearance to prior CT 10/07/2011. Visualized lung bases are clear. Hepatobiliary: No focal liver  lesion. Small layering hyperdensity within the gallbladder lumen likely reflecting a small stone. The common bile duct is dilated measuring 15 mm the level of the pancreatic head (previously measured 9 mm). No discrete stone within the common bile duct. Pancreas: The main pancreatic duct is dilated measuring up to 8 mm, which is new from prior. No discrete pancreatic lesion. No peripancreatic inflammatory changes. Spleen: Stable mild splenomegaly. Adrenals/Urinary Tract: Unremarkable adrenal glands. Atrophic kidneys with innumerable predominantly low-density lesions likely reflecting cystic disease of dialysis. Urinary bladder is largely decompressed, limiting its evaluation. Stomach/Bowel: The bowel is well distended with oral contrast. There is prominent circumferential bowel wall thickening involving the cecum, ascending colon, and proximal transverse colon. Appendix not definitively visualized. Scattered sigmoid diverticulosis. No bowel obstruction. Stomach appears within normal limits. Vascular/Lymphatic: Aortoiliac atherosclerosis without aneurysm. No abdominopelvic lymphadenopathy is visualized. Reproductive: Status post hysterectomy. No adnexal masses. Other: No ascites. No abdominopelvic fluid collection. No pneumoperitoneum. Previously seen small 12 x 9 mm supraumbilical abdominal wall hernia is now hyperattenuating with stranding within the hernia sac (series 7, image 38). Musculoskeletal: Diffusely abnormal sclerotic appearance of the osseous structures compatible with chronic renal osteodystrophy. No acute fracture identified. IMPRESSION: 1. Prominent circumferential bowel wall thickening involving the cecum, ascending colon, and  proximal transverse colon compatible with a infectious or inflammatory colitis. 2. Small 1.2 cm supraumbilical abdominal wall hernia previously containing only fat with new increased density/stranding within the hernia sac concerning for strangulation. 3. Interval dilation of  the common bile duct and main pancreatic duct without definitive obstructive etiology. Further evaluation with MRI/MRCP is recommended. Non-emergent MRI should be deferred until patient has been discharged for the acute illness, and can optimally cooperate with positioning and breath-holding instructions. 4. Chronic skin thickening and edematous changes to the visualized right breast, similar to prior CT 2013. 5. Additional chronic findings, as above. Electronically Signed   By: Davina Poke D.O.   On: 07/19/2019 15:19   DG CHEST PORT 1 VIEW  Result Date: 07/19/2019 CLINICAL DATA:  69 year old with shortness of breath. Current history of HIV, hypertension, pulmonary hypertension and atrial flutter. Former smoker. Negative COVID-19 test earlier today. EXAM: PORTABLE CHEST 1 VIEW COMPARISON:  07/23/2017. FINDINGS: Cardiac silhouette moderately enlarged, unchanged. Thoracic aorta atherosclerotic, unchanged. Massive enlargement of the central pulmonary arteries, even more so than on the examination 2 years ago. Patchy airspace opacities with air bronchograms medially in the LEFT lung base and to a lesser degree medially in the RIGHT lung base. Lungs otherwise clear. Pulmonary vascularity normal without evidence of pulmonary edema. No visible pleural effusions. RIGHT subclavian vein stent. Surgical clips in the low RIGHT axilla/axillary tail of the RIGHT breast. IMPRESSION: 1. Bibasilar pneumonia, LEFT greater than RIGHT. 2. Stable cardiomegaly without evidence of pulmonary edema. 3. Massive enlargement of the central pulmonary arteries indicating pulmonary arterial hypertension, with interval increase in size of the pulmonary arteries since the examination 2 years ago. Electronically Signed   By: Evangeline Dakin M.D.   On: 07/19/2019 20:57    Orson Eva, DO  [facility]  If 7PM-7AM, please contact night-coverage [email or web address] Password [email or web address] 07/21/2019, 2:12 PM   LOS: 2 days

## 2019-07-22 LAB — GLUCOSE, CAPILLARY
Glucose-Capillary: 76 mg/dL (ref 70–99)
Glucose-Capillary: 88 mg/dL (ref 70–99)
Glucose-Capillary: 122 mg/dL — ABNORMAL HIGH (ref 70–99)
Glucose-Capillary: 70 mg/dL (ref 70–99)

## 2019-07-22 LAB — CLOSTRIDIUM DIFFICILE BY PCR, REFLEXED: Toxigenic C. Difficile by PCR: POSITIVE — AB

## 2019-07-22 MED ORDER — AZITHROMYCIN 500 MG PO TABS: 500.0000 mg | ORAL_TABLET | Freq: Every day | ORAL | 4 refills | Status: DC

## 2019-07-22 MED ORDER — CEFDINIR 300 MG PO CAPS
300.0000 mg | ORAL_CAPSULE | ORAL | Status: DC
  Administered 2019-07-22: 300 mg via ORAL
  Filled 2019-07-22: qty 1

## 2019-07-22 MED ORDER — VANCOMYCIN 50 MG/ML ORAL SOLUTION: 125.0000 mg | Freq: Four times a day (QID) | ORAL | 0 refills | Status: DC

## 2019-07-22 MED ORDER — AZITHROMYCIN 250 MG PO TABS
500.0000 mg | ORAL_TABLET | ORAL | Status: DC
  Administered 2019-07-22: 500 mg via ORAL
  Filled 2019-07-22: qty 2

## 2019-07-22 MED ORDER — OXYCODONE HCL 10 MG PO TABS: 10.0000 mg | ORAL_TABLET | Freq: Two times a day (BID) | ORAL | 0 refills | Status: DC | PRN

## 2019-07-22 MED ORDER — VANCOMYCIN 50 MG/ML ORAL SOLUTION
125.0000 mg | Freq: Four times a day (QID) | ORAL | Status: DC
  Administered 2019-07-22 (×3): 125 mg via ORAL
  Filled 2019-07-22 (×9): qty 2.5

## 2019-07-22 MED ORDER — SULFAMETHOXAZOLE-TRIMETHOPRIM 800-160 MG PO TABS: 1.0000 | ORAL_TABLET | ORAL | 0 refills | Status: DC

## 2019-07-22 MED ORDER — CEFDINIR 300 MG PO CAPS: 300.0000 mg | ORAL_CAPSULE | Freq: Every day | ORAL | 0 refills | Status: DC

## 2019-07-22 NOTE — [place] FL2 (Signed)
[place] MEDICAID FL2 LEVEL OF CARE SCREENING TOOL     IDENTIFICATION  Patient Name: [name] Birthdate: [birth year] Sex: female Admission Date (Current Location): 07/19/2019  [facility] and [place] Number:  [employer] and Address:  [place] [address], [place]      Provider Number: Forestine Na)  Attending Physician Name and Address:  Orson Eva, MD  Relative Name and Phone Number:  [name] [phone]    Current Level of Care: Hospital(LTC) Recommended Level of Care: Nursing Facility Prior Approval Number:    Date Approved/Denied:   PASRR Number:    Discharge Plan: [facility])    Current Diagnoses: Patient Active Problem List   Diagnosis Date Noted  . Common bile duct dilation   . Lobar pneumonia ([place]) 07/20/2019  . Acute respiratory failure with hypoxia ([place]) 07/20/2019  . Colitis 07/19/2019  . Dilation of common bile duct 07/19/2019  . Ventral hernia without obstruction or gangrene   . Hypocalcemia 03/07/2019  . Other pancytopenia ([place]) 12/08/2018  . Anemia of chronic disease 04/17/2016  . Hepatitis B immune 01/28/2016  . Atrial flutter ([place]) 02/22/2015  . Hyperkalemia 02/20/2015  . Venous stenosis of right upper extremity 04/06/2014  . ESRD on dialysis ([place]) 04/06/2014  . Essential hypertension   . Nausea with vomiting 12/15/2012  . CHF, chronic ([place]) 11/25/2012  . Atrial fibrillation ([place]) 05/02/2012  . Intracranial bleed ([place]) 12/28/2011  . Angioedema of lips 09/21/2011  . Cellulitis of breast 03/09/2011  . Erosive esophagitis 12/15/2010  . Mallory - Weiss tear 12/15/2010  . UGI bleed 12/13/2010  . DIARRHEA 04/12/2009  . Human immunodeficiency virus (HIV) disease ([place]) 11/15/2008  . PANCREATITIS, HX OF 11/15/2008  . TOBACCO USE, QUIT 11/15/2008  . PAIN IN JOINT, UPPER ARM 08/07/2008  . ABDOMINAL WALL HERNIA 04/26/2007  . BRONCHITIS, ACUTE 11/16/2006  .  HYPOKALEMIA, HX OF 07/13/2006  . DEPRESSION 03/23/2006  . Essential hypertension, malignant 03/23/2006  . GERD 03/23/2006  . PANCREATITIS 03/23/2006  . MASS, RIGHT AXILLA 03/23/2006  . HEADACHE 03/23/2006  . SYMPTOM, ENLARGEMENT, LYMPH NODES 03/23/2006  . SHINGLES, HX OF 03/23/2006  . HYSTERECTOMY, TOTAL, HX OF 03/23/2006    Orientation RESPIRATION BLADDER Height & Weight     Self, Time, Situation  Normal Continent Weight: 107 lb 2.3 oz (48.6 kg) Height:  5\' 2"  (157.5 cm)  BEHAVIORAL SYMPTOMS/MOOD NEUROLOGICAL BOWEL NUTRITION STATUS      Continent Diet  AMBULATORY STATUS COMMUNICATION OF NEEDS Skin   Extensive Assist Verbally Normal                       Personal Care Assistance Level of Assistance  Bathing, Dressing, Feeding Bathing Assistance: Limited assistance Feeding assistance: Independent Dressing Assistance: Limited assistance     Functional Limitations Info             SPECIAL CARE FACTORS FREQUENCY                       Contractures Contractures Info: Not present    Additional Factors Info  Code Status Code Status Info: full code             Current Medications (07/22/2019):  This is the current hospital active medication list Current Facility-Administered Medications  Medication Dose Route Frequency Provider Last Rate Last Admin  . 0.9 %  sodium chloride infusion  100 mL Intravenous PRN Donato Heinz, MD      .  0.9 %  sodium chloride infusion  100 mL Intravenous PRN Donato Heinz, MD      . acetaminophen (TYLENOL) tablet 650 mg  650 mg Oral Q6H PRN Emokpae, Ejiroghene E, MD   650 mg at 07/22/19 1103   Or  . acetaminophen (TYLENOL) suppository 650 mg  650 mg Rectal Q6H PRN Emokpae, Ejiroghene E, MD      . azithromycin (ZITHROMAX) tablet 500 mg  500 mg Oral Q24H Tat, David, MD      . calcium acetate (PHOSLO) capsule 2,001 mg  2,001 mg Oral TID WC Orson Eva, MD   2,001 mg at 07/22/19 1339  . cefdinir (OMNICEF) capsule 300 mg   300 mg Oral Q48H Tat, David, MD      . Chlorhexidine Gluconate Cloth 2 % PADS 6 each  6 each Topical Q0600 Donato Heinz, MD   6 each at 07/22/19 0516  . dolutegravir (TIVICAY) tablet 50 mg  50 mg Oral Daily Emokpae, Ejiroghene E, MD   50 mg at 07/22/19 1049  . fentaNYL (DURAGESIC) 50 MCG/HR 1 patch  1 patch Transdermal Q72H Emokpae, Ejiroghene E, MD   1 patch at 07/19/19 2341  . heparin injection 1,000 Units  20 Units/kg Dialysis PRN Donato Heinz, MD      . heparin injection 5,000 Units  5,000 Units Subcutaneous Q8H Emokpae, Ejiroghene E, MD   5,000 Units at 07/22/19 0515  . lamiVUDine (EPIVIR) 10 MG/ML solution 50 mg  50 mg Oral Daily Emokpae, Ejiroghene E, MD   50 mg at 07/22/19 1049  . lidocaine (PF) (XYLOCAINE) 1 % injection 5 mL  5 mL Intradermal PRN Donato Heinz, MD      . lidocaine-prilocaine (EMLA) cream 1 application  1 application Topical PRN Donato Heinz, MD      . mirtazapine (REMERON) tablet 15 mg  15 mg Oral QHS Emokpae, Ejiroghene E, MD   15 mg at 07/21/19 2255  . multivitamin (RENA-VIT) tablet 1 tablet  1 tablet Oral QHS Emokpae, Ejiroghene E, MD   1 tablet at 07/21/19 2254  . ondansetron (ZOFRAN) tablet 4 mg  4 mg Oral Q6H PRN Emokpae, Ejiroghene E, MD       Or  . ondansetron (ZOFRAN) injection 4 mg  4 mg Intravenous Q6H PRN Emokpae, Ejiroghene E, MD   4 mg at 07/22/19 0510  . oxyCODONE (Oxy IR/ROXICODONE) immediate release tablet 10 mg  10 mg Oral Q12H PRN Emokpae, Ejiroghene E, MD   10 mg at 07/21/19 1131  . pentafluoroprop-tetrafluoroeth (GEBAUERS) aerosol 1 application  1 application Topical PRN Coladonato, Broadus John, MD      . polyethylene glycol (MIRALAX / GLYCOLAX) packet 17 g  17 g Oral Daily PRN Emokpae, Ejiroghene E, MD      . sertraline (ZOLOFT) tablet 125 mg  125 mg Oral QHS Emokpae, Ejiroghene E, MD   125 mg at 07/21/19 2253  . sulfamethoxazole-trimethoprim (BACTRIM DS) 800-160 MG per tablet 1 tablet  1 tablet Oral Q T,Th,Sa-HD Orson Eva, MD   1  tablet at 07/22/19 1339  . traZODone (DESYREL) tablet 50 mg  50 mg Oral QHS Emokpae, Ejiroghene E, MD   50 mg at 07/21/19 2254  . vancomycin (VANCOCIN) 50 mg/mL oral solution 125 mg  125 mg Oral QID Orson Eva, MD   125 mg at 07/22/19 1345  . zidovudine (RETROVIR) capsule 100 mg  100 mg Oral TID Emokpae, Ejiroghene E, MD   100 mg at 07/22/19 1049     Discharge Medications: Aspercreme  w/Lidocaine 4 % Crea Generic drug: Lidocaine HCl Apply 1 application topically 2 (two) times daily as needed (to bilateral wrists for pain).   azithromycin 500 MG tablet Commonly known as: ZITHROMAX Take 1 tablet (500 mg total) by mouth daily. X 4 days   Biotin 5000 MCG Caps Take 1 capsule by mouth daily.   calcium acetate (Phos Binder) 667 MG/5ML Soln Commonly known as: PHOSLYRA Take 2,001 mg by mouth 3 (three) times daily with meals.   calcium carbonate 500 MG chewable tablet Commonly known as: TUMS - dosed in mg elemental calcium Chew 2 tablets by mouth in the morning and at bedtime.   cefdinir 300 MG capsule Commonly known as: OMNICEF Take 1 capsule (300 mg total) by mouth daily. X 4 days   clopidogrel 75 MG tablet Commonly known as: Plavix Take 1 tablet (75 mg total) by mouth daily.   fentaNYL 50 MCG/HR Commonly known as: DURAGESIC Apply one patch every 72 hours for pain. Remove old patch. External use only. Rotate sites. What changed:   how much to take  how to take this  when to take this  additional instructions   lamivudine 100 MG tablet Commonly known as: EPIVIR Take 50 mg by mouth daily.   lidocaine-prilocaine cream Commonly known as: EMLA Apply 1 application topically Every Tuesday,Thursday,and Saturday with dialysis. 1 hour prior to dialysis   mirtazapine 15 MG tablet Commonly known as: REMERON Take 15 mg by mouth at bedtime.   multivitamin Tabs tablet Take 1 tablet by mouth at bedtime.   omeprazole 40 MG capsule Commonly known as: PRILOSEC Take 40  mg by mouth in the morning.   Oxycodone HCl 10 MG Tabs Take 1 tablet (10 mg total) by mouth every 12 (twelve) hours as needed (severe pain).   promethazine 12.5 MG tablet Commonly known as: PHENERGAN Take 12.5 mg by mouth every Monday, Wednesday, and Friday with hemodialysis. May take an additional 12.5 mg dose every 6 hours as needed for nausea   promethazine 6.25 MG/5ML syrup Commonly known as: PHENERGAN Place 12.5 mg into feeding tube daily as needed for nausea or vomiting.   sertraline 100 MG tablet Commonly known as: ZOLOFT Take 100 mg by mouth at bedtime. 100mg  with 25mg  at bedtime   sertraline 25 MG tablet Commonly known as: ZOLOFT Take 25 mg by mouth at bedtime. Take 25 mg with the 100 mg.   sulfamethoxazole-trimethoprim 800-160 MG tablet Commonly known as: BACTRIM DS Take 1 tablet by mouth Every Tuesday,Thursday,and Saturday with dialysis. Start taking on: Jul 25, 2019   Tivicay 50 MG tablet Generic drug: dolutegravir Take 50 mg by mouth daily.   traZODone 50 MG tablet Commonly known as: DESYREL Take 50 mg by mouth at bedtime.   vancomycin 50 mg/mL  oral solution Commonly known as: VANCOCIN Take 2.5 mLs (125 mg total) by mouth 4 (four) times daily. X 10 days   vitamin B-12 100 MCG tablet Commonly known as: CYANOCOBALAMIN Take 100 mcg by mouth daily.   vitamin C 500 MG tablet Commonly known as: ASCORBIC ACID Take 500 mg by mouth 2 (two) times daily.   zidovudine 100 MG capsule Commonly known as: RETROVIR Take 100 mg by mouth 3 (three) times daily.        Relevant Imaging Results:  Relevant Lab Results:   Additional Information social security number [id number]  Elliot Gurney [place], [place]

## 2019-07-22 NOTE — Plan of Care (Signed)
  Problem: Clinical Measurements: Goal: Ability to maintain clinical measurements within normal limits will improve Outcome: Progressing   Problem: Safety: Goal: Ability to remain free from injury will improve Outcome: Progressing   Problem: Skin Integrity: Goal: Risk for impaired skin integrity will decrease Outcome: Progressing   Problem: Nutrition: Goal: Adequate nutrition will be maintained Outcome: Not Progressing Patient refusing to eat the food ordered for her.  She is only eating Orange jello.

## 2019-07-22 NOTE — Procedures (Signed)
   HEMODIALYSIS TREATMENT NOTE:  3.25 hour heparin-free HD completed via RUE AVF (15g/antegrade). Goal met: 2.6 liters removed without interruption in UF.  All blood was returned and hemostasis was achieved in 15 minutes.  Pt at EDW 46kg. No changes from pre-HD assessment.  Rockwell Alexandria, RN

## 2019-07-22 NOTE — Discharge Summary (Addendum)
Physician Discharge Summary  [name] HYW:[id number] DOB: [birth year] DOA: 07/19/2019  PCP: Hilbert Corrigan, MD  Admit date: 07/19/2019 Discharge date: 07/22/2019  Admitted From: SNF Disposition:  SNF  Recommendations for Outpatient Follow-up:  1. Follow up with PCP in 1-2 weeks 2. Please obtain BMP/CBC in one week    Discharge Condition: Stable CODE STATUS: FULL Diet recommendation: Renal with fluid restriction  Brief/Interim Summary: 69 year old female with a history of HIV, atrial fibrillation, ESRD(TTS), hypertension, TIA, thrombocytopenia presenting with 1 week history of abdominal pain, loose stools, and fevers and chills. The patient is also been complaining of shortness of breath of unclear duration. She is a difficult historian at best. She denies any hematochezia or melena. She has not had any vomiting but has had some nausea. She is not able to clarify exacerbating or alleviating factors frequent with regards to her shortness of breath. She denies any coughing or hemoptysis. She is a resident of [place]. The patient states that she has had 3-4 loose bowel movements on a daily basis but has not had any hematochezia or melena. She describes her pain as moderate that is periumbilical and left lower quadrant area. In the emergency department, the patient was afebrile hemodynamically stable with oxygen saturation 96% on room air. WBC 4.4, hemoglobin 8.9, platelets 117,000. CT of the abdomen and pelvis showed circumferential wall thickening of the cecum, ascending colon, proximal transverse colon. There was also a small supraumbilical hernia. There was dilatation of the common bile duct and main pancreatic duct. Chest x-ray showed bibasilar opacities, left greater than right.  Discharge Diagnoses:  Diarrhea/colitis-->Cdiff colitis -Initially Broadened antibiotic coverage to [place] which will cover her pneumonia -GI consult appreciated-->outpt  colonoscopy -Collected C. Difficile -Stool pathogen panel-neg -Cdiff antigen/PCR positive with neg toxin-->however pt with abd pain, diarrhea and abnormal CT with colonic wall thickening-->plan to treat with vanco po x 10 days -advance diet which pt tolerated  Lobar pneumonia -Personally reviewed chest x-ray--bibasilar opacities, left greater than right -Continue azithromycin--4 more days after d/c -Broaden antibiotic coverage withMerrem (PCN allergy)given the patient's HIV status and nursing home residence -d/c home with cefdinir x 4 more days to complete one week tx  Acute respiratory failure with hypoxia -Currently stable on 3 L>>>RA -Secondary to pneumonia -Wean oxygen as tolerated for saturation greater 92%  Dilated common bile duct and pancreatic duct -LFTs and bilirubin are normal -GI consult appreciated-->outpt colonoscopy -MRCP as oupt; may need CT pancreatic protocol  ESRD -Nephrology consult for maintenance dialysis -last HD on 07/22/19  AIDS -HIV RNA--<20 copies -CD4 count--169/22% -start Bactrim DS thrice per week -Continue dolutegravir, lamivudine, and zidovudine  Thrombocytopenia -This appears to be chronic -Monitor for signs of bleeding  Depression/anxiety -Continue Remeron and Zoloft  Chronic pain syndrome -Continue fentanyl transdermal patch    Discharge Instructions   Allergies as of 07/22/2019      Reactions   Lactose    Lactose Intolerance (gi) Other (See Comments)   On MAR   Other    Latex Rash, Itching   Penicillins Rash, Swelling   Has patient had a PCN reaction causing immediate rash, facial/tongue/throat swelling, SOB or lightheadedness with hypotension: No Has patient had a PCN reaction causing severe rash involving mucus membranes or skin necrosis: No Has patient had a PCN reaction that required hospitalization No Has patient had a PCN reaction occurring within the last 10 years: No If all of the above answers are "NO",  then may proceed with Cephalosporin use.  Medication List    STOP taking these medications   diltiazem 60 MG tablet Commonly known as: CARDIZEM   metoprolol tartrate 50 MG tablet Commonly known as: LOPRESSOR     TAKE these medications   Aspercreme w/Lidocaine 4 % Crea Generic drug: Lidocaine HCl Apply 1 application topically 2 (two) times daily as needed (to bilateral wrists for pain).   azithromycin 500 MG tablet Commonly known as: ZITHROMAX Take 1 tablet (500 mg total) by mouth daily. X 4 days   Biotin 5000 MCG Caps Take 1 capsule by mouth daily.   calcium acetate (Phos Binder) 667 MG/5ML Soln Commonly known as: PHOSLYRA Take 2,001 mg by mouth 3 (three) times daily with meals.   calcium carbonate 500 MG chewable tablet Commonly known as: TUMS - dosed in mg elemental calcium Chew 2 tablets by mouth in the morning and at bedtime.   cefdinir 300 MG capsule Commonly known as: OMNICEF Take 1 capsule (300 mg total) by mouth daily. X 4 days   clopidogrel 75 MG tablet Commonly known as: Plavix Take 1 tablet (75 mg total) by mouth daily.   fentaNYL 50 MCG/HR Commonly known as: DURAGESIC Apply one patch every 72 hours for pain. Remove old patch. External use only. Rotate sites. What changed:   how much to take  how to take this  when to take this  additional instructions   lamivudine 100 MG tablet Commonly known as: EPIVIR Take 50 mg by mouth daily.   lidocaine-prilocaine cream Commonly known as: EMLA Apply 1 application topically Every Tuesday,Thursday,and Saturday with dialysis. 1 hour prior to dialysis   mirtazapine 15 MG tablet Commonly known as: REMERON Take 15 mg by mouth at bedtime.   multivitamin Tabs tablet Take 1 tablet by mouth at bedtime.   omeprazole 40 MG capsule Commonly known as: PRILOSEC Take 40 mg by mouth in the morning.   Oxycodone HCl 10 MG Tabs Take 1 tablet (10 mg total) by mouth every 12 (twelve) hours as needed (severe  pain).   promethazine 12.5 MG tablet Commonly known as: PHENERGAN Take 12.5 mg by mouth every Monday, Wednesday, and Friday with hemodialysis. May take an additional 12.5 mg dose every 6 hours as needed for nausea   promethazine 6.25 MG/5ML syrup Commonly known as: PHENERGAN Place 12.5 mg into feeding tube daily as needed for nausea or vomiting.   sertraline 100 MG tablet Commonly known as: ZOLOFT Take 100 mg by mouth at bedtime. 100mg  with 25mg  at bedtime   sertraline 25 MG tablet Commonly known as: ZOLOFT Take 25 mg by mouth at bedtime. Take 25 mg with the 100 mg.   sulfamethoxazole-trimethoprim 800-160 MG tablet Commonly known as: BACTRIM DS Take 1 tablet by mouth Every Tuesday,Thursday,and Saturday with dialysis. Start taking on: Jul 25, 2019   Tivicay 50 MG tablet Generic drug: dolutegravir Take 50 mg by mouth daily.   traZODone 50 MG tablet Commonly known as: DESYREL Take 50 mg by mouth at bedtime.   vancomycin 50 mg/mL  oral solution Commonly known as: VANCOCIN Take 2.5 mLs (125 mg total) by mouth 4 (four) times daily. X 10 days   vitamin B-12 100 MCG tablet Commonly known as: CYANOCOBALAMIN Take 100 mcg by mouth daily.   vitamin C 500 MG tablet Commonly known as: ASCORBIC ACID Take 500 mg by mouth 2 (two) times daily.   zidovudine 100 MG capsule Commonly known as: RETROVIR Take 100 mg by mouth 3 (three) times daily.  Allergies  Allergen Reactions  . Lactose   . Lactose Intolerance (Gi) Other (See Comments)    On MAR  . Other   . Latex Rash and Itching  . Penicillins Rash and Swelling    Has patient had a PCN reaction causing immediate rash, facial/tongue/throat swelling, SOB or lightheadedness with hypotension: No Has patient had a PCN reaction causing severe rash involving mucus membranes or skin necrosis: No Has patient had a PCN reaction that required hospitalization No Has patient had a PCN reaction occurring within the last 10 years:  No If all of the above answers are "NO", then may proceed with Cephalosporin use.      Consultations:  GI  Renal    Procedures/Studies: CT SOFT TISSUE NECK W CONTRAST  Result Date: 07/19/2019 CLINICAL DATA:  Initial evaluation for shortness of breath, difficulty with breathing. EXAM: CT NECK WITH CONTRAST TECHNIQUE: Multidetector CT imaging of the neck was performed using the standard protocol following the bolus administration of intravenous contrast. CONTRAST:  46mL OMNIPAQUE IOHEXOL 300 MG/ML  SOLN COMPARISON:  Comparison made with prior neck CT from 08/24/2002. FINDINGS: Pharynx and larynx: Examination somewhat technically limited by patient positioning. Oral cavity within normal limits without discrete mass or collection. Patient is edentulous. No base of tongue lesion. Oropharynx within normal limits. No evidence for tonsillitis or acute peritonsillar abscess. Mild asymmetric fullness at the right nasopharynx favored to be related to positioning. No discrete mass identified. No retropharyngeal collection or swelling. Epiglottis within normal limits. Vallecula clear. Remainder of the hypopharynx and supraglottic larynx within normal limits. True cords fairly symmetric and normal. Subglottic airway largely clear. Salivary glands: Salivary glands including the parotid and submandibular glands are within normal limits. Thyroid: Few subcentimeter dystrophic calcifications noted within the thyroid. Thyroid otherwise unremarkable. Lymph nodes: No pathologically enlarged lymph nodes seen within the neck. Mildly prominent 6 mm left level V node with surrounding inflammatory stranding noted (series 6, image 54), indeterminate, but could be reactive in nature. Vascular: Moderate atherosclerotic change present about the aortic arch, carotid bifurcations, and carotid siphons. Diffuse tortuosity the major arterial vasculature of the neck suggesting chronic underlying hypertension. Both carotid artery  systems partially medialized into the retropharyngeal space. Vascular stent in place within the distal right subclavian vein as it dumps into the SVC. Patent flow seen through the stent. 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 Limited intracranial: Unremarkable. Visualized orbits: Globes and orbital soft tissues within normal limits. Mastoids and visualized paranasal sinuses: Visualized paranasal sinuses are clear. Visualized mastoids and middle ear cavities are well pneumatized and free of fluid. Skeleton: No acute osseous abnormality. Bones are diffusely sclerotic with somewhat permeative appearance, consistent with underlying renal osteodystrophy. Cervicothoracic scoliotic curvature with underlying moderate multilevel degenerative spondylosis, most pronounced at C4-5 through C6-7. At [facility] axial fusion noted. Prominent degenerative changes noted about the TMJs bilaterally. Upper chest: Hazy fat stranding within the visualized right chest wall and axillary region favored to be related to vascular congestion. Visualized upper chest demonstrates no other acute abnormality. Partially visualized lungs are largely clear. Mild paraseptal emphysema. Other: None. IMPRESSION: 1. Mildly prominent 6 mm left level V node with surrounding inflammatory stranding, indeterminate, but could be reactive in nature. 2. No other acute abnormality within the neck. 3. Diffuse tortuosity the major arterial vasculature of the neck suggesting chronic underlying hypertension. 4. Vascular stent in place within the distal right subclavian vein with patent flow. Multiple prominent venous collaterals throughout  the neck and upper chest wall. 5. Cervicothoracic scoliotic curvature with underlying moderate multilevel degenerative spondylosis, most pronounced at C4-5 through C6-7. 6. Diffuse sclerosis with somewhat permeative appearance of the bones, most likely  related to underlying renal osteodystrophy. 7. Aortic Atherosclerosis (ICD10-I70.0) and Emphysema (ICD10-J43.9). Electronically Signed   By: Jeannine Boga M.D.   On: 07/19/2019 21:19   CT ABDOMEN PELVIS W CONTRAST  Result Date: 07/19/2019 CLINICAL DATA:  Chronic pancytopenia, abdominal pain. Possible hernia EXAM: CT ABDOMEN AND PELVIS WITH CONTRAST TECHNIQUE: Multidetector CT imaging of the abdomen and pelvis was performed using the standard protocol following bolus administration of intravenous contrast. CONTRAST:  156mL OMNIPAQUE IOHEXOL 300 MG/ML  SOLN COMPARISON:  10/07/2011 FINDINGS: Lower chest: Cardiomegaly. Chronic skin thickening and edematous appearance of the visualized right breast, similar in appearance to prior CT 10/07/2011. Visualized lung bases are clear. Hepatobiliary: No focal liver lesion. Small layering hyperdensity within the gallbladder lumen likely reflecting a small stone. The common bile duct is dilated measuring 15 mm the level of the pancreatic head (previously measured 9 mm). No discrete stone within the common bile duct. Pancreas: The main pancreatic duct is dilated measuring up to 8 mm, which is new from prior. No discrete pancreatic lesion. No peripancreatic inflammatory changes. Spleen: Stable mild splenomegaly. Adrenals/Urinary Tract: Unremarkable adrenal glands. Atrophic kidneys with innumerable predominantly low-density lesions likely reflecting cystic disease of dialysis. Urinary bladder is largely decompressed, limiting its evaluation. Stomach/Bowel: The bowel is well distended with oral contrast. There is prominent circumferential bowel wall thickening involving the cecum, ascending colon, and proximal transverse colon. Appendix not definitively visualized. Scattered sigmoid diverticulosis. No bowel obstruction. Stomach appears within normal limits. Vascular/Lymphatic: Aortoiliac atherosclerosis without aneurysm. No abdominopelvic lymphadenopathy is visualized.  Reproductive: Status post hysterectomy. No adnexal masses. Other: No ascites. No abdominopelvic fluid collection. No pneumoperitoneum. Previously seen small 12 x 9 mm supraumbilical abdominal wall hernia is now hyperattenuating with stranding within the hernia sac (series 7, image 38). Musculoskeletal: Diffusely abnormal sclerotic appearance of the osseous structures compatible with chronic renal osteodystrophy. No acute fracture identified. IMPRESSION: 1. Prominent circumferential bowel wall thickening involving the cecum, ascending colon, and proximal transverse colon compatible with a infectious or inflammatory colitis. 2. Small 1.2 cm supraumbilical abdominal wall hernia previously containing only fat with new increased density/stranding within the hernia sac concerning for strangulation. 3. Interval dilation of the common bile duct and main pancreatic duct without definitive obstructive etiology. Further evaluation with MRI/MRCP is recommended. Non-emergent MRI should be deferred until patient has been discharged for the acute illness, and can optimally cooperate with positioning and breath-holding instructions. 4. Chronic skin thickening and edematous changes to the visualized right breast, similar to prior CT 2013. 5. Additional chronic findings, as above. Electronically Signed   By: Davina Poke D.O.   On: 07/19/2019 15:19   DG CHEST PORT 1 VIEW  Result Date: 07/19/2019 CLINICAL DATA:  69 year old with shortness of breath. Current history of HIV, hypertension, pulmonary hypertension and atrial flutter. Former smoker. Negative COVID-19 test earlier today. EXAM: PORTABLE CHEST 1 VIEW COMPARISON:  07/23/2017. FINDINGS: Cardiac silhouette moderately enlarged, unchanged. Thoracic aorta atherosclerotic, unchanged. Massive enlargement of the central pulmonary arteries, even more so than on the examination 2 years ago. Patchy airspace opacities with air bronchograms medially in the LEFT lung base and to a  lesser degree medially in the RIGHT lung base. Lungs otherwise clear. Pulmonary vascularity normal without evidence of pulmonary edema. No visible pleural effusions. RIGHT subclavian vein stent. Surgical clips  in the low RIGHT axilla/axillary tail of the RIGHT breast. IMPRESSION: 1. Bibasilar pneumonia, LEFT greater than RIGHT. 2. Stable cardiomegaly without evidence of pulmonary edema. 3. Massive enlargement of the central pulmonary arteries indicating pulmonary arterial hypertension, with interval increase in size of the pulmonary arteries since the examination 2 years ago. Electronically Signed   By: Evangeline Dakin M.D.   On: 07/19/2019 20:57        Discharge Exam: Vitals:   07/22/19 1230 07/22/19 1300  BP: (!) 148/74 131/71  Pulse: (!) 59 (!) 58  Resp:    Temp:    SpO2:     Vitals:   07/22/19 1130 07/22/19 1200 07/22/19 1230 07/22/19 1300  BP: (!) 150/72 (!) 150/62 (!) 148/74 131/71  Pulse: 68 60 (!) 59 (!) 58  Resp:      Temp:      TempSrc:      SpO2:      Weight:      Height:        General: Pt is alert, awake, not in acute distress Cardiovascular: RRR, S1/S2 +, no rubs, no gallops Respiratory: CTA bilaterally, no wheezing, no rhonchi Abdominal: Soft, NT, ND, bowel sounds + Extremities: no edema, no cyanosis   The results of significant diagnostics from this hospitalization (including imaging, microbiology, ancillary and laboratory) are listed below for reference.    Significant Diagnostic Studies: CT SOFT TISSUE NECK W CONTRAST  Result Date: 07/19/2019 CLINICAL DATA:  Initial evaluation for shortness of breath, difficulty with breathing. EXAM: CT NECK WITH CONTRAST TECHNIQUE: Multidetector CT imaging of the neck was performed using the standard protocol following the bolus administration of intravenous contrast. CONTRAST:  63mL OMNIPAQUE IOHEXOL 300 MG/ML  SOLN COMPARISON:  Comparison made with prior neck CT from 08/24/2002. FINDINGS: Pharynx and larynx: Examination  somewhat technically limited by patient positioning. Oral cavity within normal limits without discrete mass or collection. Patient is edentulous. No base of tongue lesion. Oropharynx within normal limits. No evidence for tonsillitis or acute peritonsillar abscess. Mild asymmetric fullness at the right nasopharynx favored to be related to positioning. No discrete mass identified. No retropharyngeal collection or swelling. Epiglottis within normal limits. Vallecula clear. Remainder of the hypopharynx and supraglottic larynx within normal limits. True cords fairly symmetric and normal. Subglottic airway largely clear. Salivary glands: Salivary glands including the parotid and submandibular glands are within normal limits. Thyroid: Few subcentimeter dystrophic calcifications noted within the thyroid. Thyroid otherwise unremarkable. Lymph nodes: No pathologically enlarged lymph nodes seen within the neck. Mildly prominent 6 mm left level V node with surrounding inflammatory stranding noted (series 6, image 54), indeterminate, but could be reactive in nature. Vascular: Moderate atherosclerotic change present about the aortic arch, carotid bifurcations, and carotid siphons. Diffuse tortuosity the major arterial vasculature of the neck suggesting chronic underlying hypertension. Both carotid artery systems partially medialized into the retropharyngeal space. Vascular stent in place within the distal right subclavian vein as it dumps into the SVC. Patent flow seen through the stent. 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 Limited intracranial: Unremarkable. Visualized orbits: Globes and orbital soft tissues within normal limits. Mastoids and visualized paranasal sinuses: Visualized paranasal sinuses are clear. Visualized mastoids and middle ear cavities are well pneumatized and free of fluid. Skeleton: No acute osseous abnormality. Bones are  diffusely sclerotic with somewhat permeative appearance, consistent with underlying renal osteodystrophy. Cervicothoracic scoliotic curvature with underlying moderate multilevel degenerative spondylosis, most pronounced at C4-5 through C6-7. At  [place] axial fusion noted. Prominent degenerative changes noted about the TMJs bilaterally. Upper chest: Hazy fat stranding within the visualized right chest wall and axillary region favored to be related to vascular congestion. Visualized upper chest demonstrates no other acute abnormality. Partially visualized lungs are largely clear. Mild paraseptal emphysema. Other: None. IMPRESSION: 1. Mildly prominent 6 mm left level V node with surrounding inflammatory stranding, indeterminate, but could be reactive in nature. 2. No other acute abnormality within the neck. 3. Diffuse tortuosity the major arterial vasculature of the neck suggesting chronic underlying hypertension. 4. Vascular stent in place within the distal right subclavian vein with patent flow. Multiple prominent venous collaterals throughout the neck and upper chest wall. 5. Cervicothoracic scoliotic curvature with underlying moderate multilevel degenerative spondylosis, most pronounced at C4-5 through C6-7. 6. Diffuse sclerosis with somewhat permeative appearance of the bones, most likely related to underlying renal osteodystrophy. 7. Aortic Atherosclerosis (ICD10-I70.0) and Emphysema (ICD10-J43.9). Electronically Signed   By: Jeannine Boga M.D.   On: 07/19/2019 21:19   CT ABDOMEN PELVIS W CONTRAST  Result Date: 07/19/2019 CLINICAL DATA:  Chronic pancytopenia, abdominal pain. Possible hernia EXAM: CT ABDOMEN AND PELVIS WITH CONTRAST TECHNIQUE: Multidetector CT imaging of the abdomen and pelvis was performed using the standard protocol following bolus administration of intravenous contrast. CONTRAST:  175mL OMNIPAQUE IOHEXOL 300 MG/ML  SOLN COMPARISON:  10/07/2011 FINDINGS: Lower chest: Cardiomegaly.  Chronic skin thickening and edematous appearance of the visualized right breast, similar in appearance to prior CT 10/07/2011. Visualized lung bases are clear. Hepatobiliary: No focal liver lesion. Small layering hyperdensity within the gallbladder lumen likely reflecting a small stone. The common bile duct is dilated measuring 15 mm the level of the pancreatic head (previously measured 9 mm). No discrete stone within the common bile duct. Pancreas: The main pancreatic duct is dilated measuring up to 8 mm, which is new from prior. No discrete pancreatic lesion. No peripancreatic inflammatory changes. Spleen: Stable mild splenomegaly. Adrenals/Urinary Tract: Unremarkable adrenal glands. Atrophic kidneys with innumerable predominantly low-density lesions likely reflecting cystic disease of dialysis. Urinary bladder is largely decompressed, limiting its evaluation. Stomach/Bowel: The bowel is well distended with oral contrast. There is prominent circumferential bowel wall thickening involving the cecum, ascending colon, and proximal transverse colon. Appendix not definitively visualized. Scattered sigmoid diverticulosis. No bowel obstruction. Stomach appears within normal limits. Vascular/Lymphatic: Aortoiliac atherosclerosis without aneurysm. No abdominopelvic lymphadenopathy is visualized. Reproductive: Status post hysterectomy. No adnexal masses. Other: No ascites. No abdominopelvic fluid collection. No pneumoperitoneum. Previously seen small 12 x 9 mm supraumbilical abdominal wall hernia is now hyperattenuating with stranding within the hernia sac (series 7, image 38). Musculoskeletal: Diffusely abnormal sclerotic appearance of the osseous structures compatible with chronic renal osteodystrophy. No acute fracture identified. IMPRESSION: 1. Prominent circumferential bowel wall thickening involving the cecum, ascending colon, and proximal transverse colon compatible with a infectious or inflammatory colitis. 2. Small  1.2 cm supraumbilical abdominal wall hernia previously containing only fat with new increased density/stranding within the hernia sac concerning for strangulation. 3. Interval dilation of the common bile duct and main pancreatic duct without definitive obstructive etiology. Further evaluation with MRI/MRCP is recommended. Non-emergent MRI should be deferred until patient has been discharged for the acute illness, and can optimally cooperate with positioning and breath-holding instructions. 4. Chronic skin thickening and edematous changes to the visualized right breast, similar to prior CT 2013. 5. Additional chronic findings, as above. Electronically Signed   By: Davina Poke D.O.   On: 07/19/2019 15:19  DG CHEST PORT 1 VIEW  Result Date: 07/19/2019 CLINICAL DATA:  69 year old with shortness of breath. Current history of HIV, hypertension, pulmonary hypertension and atrial flutter. Former smoker. Negative COVID-19 test earlier today. EXAM: PORTABLE CHEST 1 VIEW COMPARISON:  07/23/2017. FINDINGS: Cardiac silhouette moderately enlarged, unchanged. Thoracic aorta atherosclerotic, unchanged. Massive enlargement of the central pulmonary arteries, even more so than on the examination 2 years ago. Patchy airspace opacities with air bronchograms medially in the LEFT lung base and to a lesser degree medially in the RIGHT lung base. Lungs otherwise clear. Pulmonary vascularity normal without evidence of pulmonary edema. No visible pleural effusions. RIGHT subclavian vein stent. Surgical clips in the low RIGHT axilla/axillary tail of the RIGHT breast. IMPRESSION: 1. Bibasilar pneumonia, LEFT greater than RIGHT. 2. Stable cardiomegaly without evidence of pulmonary edema. 3. Massive enlargement of the central pulmonary arteries indicating pulmonary arterial hypertension, with interval increase in size of the pulmonary arteries since the examination 2 years ago. Electronically Signed   By: Evangeline Dakin M.D.   On:  07/19/2019 20:57     Microbiology: Recent Results (from the past 240 hour(s))  SARS Coronavirus 2 by RT PCR (hospital order, performed in [facility] hospital lab) Nasopharyngeal Nasopharyngeal Swab     Status: None   Collection Time: 07/19/19  3:43 PM   Specimen: Nasopharyngeal Swab  Result Value Ref Range Status   SARS Coronavirus 2 NEGATIVE NEGATIVE Final    Comment: (NOTE) SARS-CoV-2 target nucleic acids are NOT DETECTED. The SARS-CoV-2 RNA is generally detectable in upper and lower respiratory specimens during the acute phase of infection. The lowest concentration of SARS-CoV-2 viral copies this assay can detect is 250 copies / mL. A negative result does not preclude SARS-CoV-2 infection and should not be used as the sole basis for treatment or other patient management decisions.  A negative result may occur with improper specimen collection / handling, submission of specimen other than nasopharyngeal swab, presence of viral mutation(s) within the areas targeted by this assay, and inadequate number of viral copies (<250 copies / mL). A negative result must be combined with clinical observations, patient history, and epidemiological information. Fact Sheet for Patients:   StrictlyIdeas.no Fact Sheet for Healthcare Providers: BankingDealers.co.za This test is not yet approved or cleared  by the [place] FDA and has been authorized for detection and/or diagnosis of SARS-CoV-2 by FDA under an Emergency Use Authorization (EUA).  This EUA will remain in effect (meaning this test can be used) for the duration of the COVID-19 declaration under Section 564(b)(1) of the Act, 21 U.S.C. section 360bbb-3(b)(1), unless the authorization is terminated or revoked sooner. Performed at [facility], [address], [place], [place] [address]   MRSA PCR Screening     Status: None   Collection Time: 07/19/19  8:26 PM   Specimen: Nasopharyngeal   Result Value Ref Range Status   MRSA by PCR NEGATIVE NEGATIVE Final    Comment:        The GeneXpert MRSA Assay (FDA approved for NASAL specimens only), is one component of a comprehensive MRSA colonization surveillance program. It is not intended to diagnose MRSA infection nor to guide or monitor treatment for MRSA infections. Performed at [facility], [address] [place], [place] [address]   Gastrointestinal Panel by PCR , Stool     Status: None   Collection Time: 07/20/19  1:26 PM   Specimen: STOOL  Result Value Ref Range Status   Campylobacter species NOT DETECTED NOT DETECTED Final  Plesimonas shigelloides NOT DETECTED NOT DETECTED Final   Salmonella species NOT DETECTED NOT DETECTED Final   Yersinia enterocolitica NOT DETECTED NOT DETECTED Final   Vibrio species NOT DETECTED NOT DETECTED Final   Vibrio cholerae NOT DETECTED NOT DETECTED Final   Enteroaggregative E coli (EAEC) NOT DETECTED NOT DETECTED Final   Enteropathogenic E coli (EPEC) NOT DETECTED NOT DETECTED Final   Enterotoxigenic E coli (ETEC) NOT DETECTED NOT DETECTED Final   Shiga like toxin producing E coli (STEC) NOT DETECTED NOT DETECTED Final   Shigella/Enteroinvasive E coli (EIEC) NOT DETECTED NOT DETECTED Final   Cryptosporidium NOT DETECTED NOT DETECTED Final   Cyclospora cayetanensis NOT DETECTED NOT DETECTED Final   Entamoeba histolytica NOT DETECTED NOT DETECTED Final   Giardia lamblia NOT DETECTED NOT DETECTED Final   Adenovirus F40/41 NOT DETECTED NOT DETECTED Final   Astrovirus NOT DETECTED NOT DETECTED Final   Norovirus GI/GII NOT DETECTED NOT DETECTED Final   Rotavirus A NOT DETECTED NOT DETECTED Final   Sapovirus (I, II, IV, and V) NOT DETECTED NOT DETECTED Final    Comment: Performed at [facility], [address] [place], [place] [address]  C Difficile Quick Screen w PCR reflex     Status: Abnormal   Collection Time: 07/21/19  6:00 PM   Specimen: STOOL  Result Value Ref  Range Status   C Diff antigen POSITIVE (A) NEGATIVE Final   C Diff toxin NEGATIVE NEGATIVE Final   C Diff interpretation Results are indeterminate. See PCR results.  Final    Comment: Performed at [facility], [address], [place], [place] [address]  C. Diff by PCR, Reflexed     Status: Abnormal   Collection Time: 07/21/19  6:00 PM  Result Value Ref Range Status   Toxigenic C. Difficile by PCR POSITIVE (A) NEGATIVE Final    Comment: Positive for toxigenic C. difficile with little to no toxin production. Only treat if clinical presentation suggests symptomatic illness. Performed at [facility], [place] [address] [place], [place] [address]      Labs: Basic Metabolic Panel: Recent Labs  Lab 07/19/19 1104 07/19/19 1104 07/20/19 0447 07/21/19 0510  NA 137  --  136 136  K 4.6   < > 4.8 4.1  CL 94*  --  95* 96*  CO2 27  --  26 27  GLUCOSE 87  --  80 67*  BUN 59*  --  64* 27*  CREATININE 9.29*  --  9.70* 5.81*  CALCIUM 9.1  --  9.1 9.0   < > = values in this interval not displayed.   Liver Function Tests: Recent Labs  Lab 07/19/19 1104 07/21/19 0510  AST 15 13*  ALT 12 10  ALKPHOS 114 100  BILITOT 0.6 0.6  PROT 7.9 6.7  ALBUMIN 3.4* 2.8*   No results for input(s): LIPASE, AMYLASE in the last 168 hours. No results for input(s): AMMONIA in the last 168 hours. CBC: Recent Labs  Lab 07/19/19 1104 07/20/19 0447 07/21/19 0510  WBC 4.4 3.2* 2.2*  NEUTROABS 2.8  --   --   HGB 8.9* 8.0* 8.1*  HCT 29.6* 27.0* 27.0*  MCV 115.2* 114.4* 114.9*  PLT 117* 105* 100*   Cardiac Enzymes: No results for input(s): CKTOTAL, CKMB, CKMBINDEX, TROPONINI in the last 168 hours. BNP: Invalid input(s): POCBNP CBG: Recent Labs  Lab 07/21/19 1133 07/21/19 1727 07/21/19 2027 07/22/19 0738 07/22/19 1131  GLUCAP 67* 73 150* 76 88    Time  coordinating discharge:  36 minutes  Signed:  Orson Eva, DO [facility] Pager: [phone] 07/22/2019, 1:47 PM

## 2019-07-22 NOTE — TOC Progression Note (Signed)
Transition of Care [facility]) - Progression Note    Patient Details  Name: [name] MRN: [id number] Date of Birth: 01/02/1951  Transition of Care [facility]) CM/SW Contact  [address] [place], [place] Phone Number: 07/22/2019, 3:19 PM  Clinical Narrative:    Patient to return to [place] [facility] by [facility]. Patient will be going into [address] RN to call report in to [place] at [phone].   [address] LCSW Transitrions of Care [phone]         Expected Discharge Plan and Services           Expected Discharge Date: 07/22/19                                     Social Determinants of Health (SDOH) Interventions    Readmission Risk Interventions No flowsheet data found.

## 2019-07-25 DIAGNOSIS — G8929 Other chronic pain: Secondary | ICD-10-CM | POA: Diagnosis not present

## 2019-07-25 DIAGNOSIS — J189 Pneumonia, unspecified organism: Secondary | ICD-10-CM | POA: Diagnosis not present

## 2019-07-25 DIAGNOSIS — K838 Other specified diseases of biliary tract: Secondary | ICD-10-CM | POA: Diagnosis not present

## 2019-07-25 DIAGNOSIS — I480 Paroxysmal atrial fibrillation: Secondary | ICD-10-CM | POA: Diagnosis not present

## 2019-07-25 DIAGNOSIS — J9601 Acute respiratory failure with hypoxia: Secondary | ICD-10-CM | POA: Diagnosis not present

## 2019-07-25 DIAGNOSIS — A0472 Enterocolitis due to Clostridium difficile, not specified as recurrent: Secondary | ICD-10-CM | POA: Diagnosis not present

## 2019-07-25 DIAGNOSIS — L89150 Pressure ulcer of sacral region, unstageable: Secondary | ICD-10-CM | POA: Diagnosis not present

## 2019-07-25 DIAGNOSIS — B20 Human immunodeficiency virus [HIV] disease: Secondary | ICD-10-CM | POA: Diagnosis not present

## 2019-07-26 DIAGNOSIS — M19031 Primary osteoarthritis, right wrist: Secondary | ICD-10-CM | POA: Diagnosis not present

## 2019-07-26 DIAGNOSIS — G5603 Carpal tunnel syndrome, bilateral upper limbs: Secondary | ICD-10-CM | POA: Diagnosis not present

## 2019-07-27 DIAGNOSIS — Z992 Dependence on renal dialysis: Secondary | ICD-10-CM | POA: Diagnosis not present

## 2019-07-27 DIAGNOSIS — N2581 Secondary hyperparathyroidism of renal origin: Secondary | ICD-10-CM | POA: Diagnosis not present

## 2019-07-27 DIAGNOSIS — D631 Anemia in chronic kidney disease: Secondary | ICD-10-CM | POA: Diagnosis not present

## 2019-07-27 DIAGNOSIS — D472 Monoclonal gammopathy: Secondary | ICD-10-CM | POA: Diagnosis not present

## 2019-07-27 DIAGNOSIS — N186 End stage renal disease: Secondary | ICD-10-CM | POA: Diagnosis not present

## 2019-07-27 DIAGNOSIS — D509 Iron deficiency anemia, unspecified: Secondary | ICD-10-CM | POA: Diagnosis not present

## 2019-07-29 DIAGNOSIS — D631 Anemia in chronic kidney disease: Secondary | ICD-10-CM | POA: Diagnosis not present

## 2019-07-29 DIAGNOSIS — Z992 Dependence on renal dialysis: Secondary | ICD-10-CM | POA: Diagnosis not present

## 2019-07-29 DIAGNOSIS — D509 Iron deficiency anemia, unspecified: Secondary | ICD-10-CM | POA: Diagnosis not present

## 2019-07-29 DIAGNOSIS — D472 Monoclonal gammopathy: Secondary | ICD-10-CM | POA: Diagnosis not present

## 2019-07-29 DIAGNOSIS — N186 End stage renal disease: Secondary | ICD-10-CM | POA: Diagnosis not present

## 2019-07-29 DIAGNOSIS — N2581 Secondary hyperparathyroidism of renal origin: Secondary | ICD-10-CM | POA: Diagnosis not present

## 2019-07-31 DIAGNOSIS — N186 End stage renal disease: Secondary | ICD-10-CM | POA: Diagnosis not present

## 2019-07-31 DIAGNOSIS — Z992 Dependence on renal dialysis: Secondary | ICD-10-CM | POA: Diagnosis not present

## 2019-08-02 ENCOUNTER — Telehealth: Payer: Self-pay | Admitting: Vascular Surgery

## 2019-08-02 NOTE — Telephone Encounter (Signed)
Patient is a 69 year old female that was recently referred to Dr. Fredna Dow from hand surgery for evaluation for carpal tunnel syndrome.  I had recently seen her in May and the question was whether or not she had steal symptoms versus carpal tunnel.  I received notes from Dr. Fredna Dow dated Jul 26, 2019.  This suggested that she has bilateral carpal tunnel syndrome as well as primary osteoarthritis of the right wrist.  She was sent for hand management as well as nerve conduction studies.  She has a right radiocephalic AV fistula as well as a known central vein stenosis.  Will await treatment plan and resolution according to Dr. Fredna Dow before reevaluating for any steal related symptoms since she seems to have nerve compression as part of her problem.  Ruta Hinds, MD [facility] Office: [phone]

## 2019-08-03 ENCOUNTER — Ambulatory Visit: Payer: Medicare Other | Admitting: Vascular Surgery

## 2019-08-04 ENCOUNTER — Ambulatory Visit: Payer: Medicare Other | Admitting: Internal Medicine

## 2019-08-04 ENCOUNTER — Ambulatory Visit (INDEPENDENT_AMBULATORY_CARE_PROVIDER_SITE_OTHER): Payer: Medicare Other | Admitting: Internal Medicine

## 2019-08-04 ENCOUNTER — Encounter: Payer: Self-pay | Admitting: Internal Medicine

## 2019-08-04 ENCOUNTER — Other Ambulatory Visit: Payer: Self-pay

## 2019-08-04 VITALS — BP 145/83 | HR 72 | Ht 62.0 in | Wt 101.0 lb

## 2019-08-04 DIAGNOSIS — I10 Essential (primary) hypertension: Secondary | ICD-10-CM

## 2019-08-04 DIAGNOSIS — I484 Atypical atrial flutter: Secondary | ICD-10-CM

## 2019-08-04 NOTE — Patient Instructions (Signed)
Medication Instructions:  Your physician recommends that you continue on your current medications as directed. Please refer to the Current Medication list given to you today.    Lab Work: None today If you have labs (blood work) drawn today and your tests are completely normal, you will receive your results only by: Marland Kitchen MyChart Message (if you have MyChart) OR . A paper copy in the mail If you have any lab test that is abnormal or we need to change your treatment, we will call you to review the results.   Testing/Procedures: None today   Follow-Up: At [facility], you and your health needs are our priority.  As part of our continuing mission to provide you with exceptional heart care, we have created designated Provider Care Teams.  These Care Teams include your primary Cardiologist (physician) and Advanced Practice Providers (APPs -  Physician Assistants and Nurse Practitioners) who all work together to provide you with the care you need, when you need it.  We recommend signing up for the patient portal called "MyChart".  Sign up information is provided on this After Visit Summary.  MyChart is used to connect with patients for Virtual Visits (Telemedicine).  Patients are able to view lab/test results, encounter notes, upcoming appointments, etc.  Non-urgent messages can be sent to your provider as well.   To learn more about what you can do with MyChart, go to [email or web address].    Your next appointment: As needed with Dr.Ross       Thank you for choosing [facility] !

## 2019-08-04 NOTE — Progress Notes (Signed)
Cardiology Office Note   Date:  08/04/2019   ID:  [name], DOB 1950/05/18, MRN [id number]  PCP:  Hilbert Corrigan, MD  Cardiologist:   Dorris Carnes, MD    Pt presents for f/u of HTN and atrial flutter     History of Present Illness: [name] is a 69 y.o. female with a history of one episode documented atrial fib/ flutter, HTN, TIA, thrombocytopenia, ESRD, HTN and HIV. Cardiac cath in 2016 ([facility] ) was normal  I last saw the pt in clinic in 2017    The pt was recently admitted to [facility] with C diff colitis, lobar pneumonia She denies palpitations.  Breathing is OK   NO dizziness   NO CP       Outpatient Medications Prior to Visit  Medication Sig Dispense Refill  . Biotin 5000 MCG CAPS Take 1 capsule by mouth daily.     . calcium acetate, Phos Binder, (PHOSLYRA) 667 MG/5ML SOLN Take 2,001 mg by mouth 3 (three) times daily with meals.    . calcium carbonate (TUMS - DOSED IN MG ELEMENTAL CALCIUM) 500 MG chewable tablet Chew 2 tablets by mouth in the morning and at bedtime.    . cefdinir (OMNICEF) 300 MG capsule Take 1 capsule (300 mg total) by mouth daily. X 4 days 4 capsule 0  . clopidogrel (PLAVIX) 75 MG tablet Take 1 tablet (75 mg total) by mouth daily. 30 tablet 3  . dolutegravir (TIVICAY) 50 MG tablet Take 50 mg by mouth daily.    . fentaNYL (DURAGESIC - DOSED MCG/HR) 50 MCG/HR Apply one patch every 72 hours for pain. Remove old patch. External use only. Rotate sites. (Patient taking differently: Place 50 mcg onto the skin every 3 (three) days. Remove old patch. External use only. Rotate sites.) 10 patch 0  . lamivudine (EPIVIR) 100 MG tablet Take 50 mg by mouth daily.    . Lidocaine HCl (ASPERCREME W/LIDOCAINE) 4 % CREA Apply 1 application topically 2 (two) times daily as needed (to bilateral wrists for pain).     Marland Kitchen lidocaine-prilocaine (EMLA) cream Apply 1 application topically Every Tuesday,Thursday,and Saturday with dialysis. 1 hour prior to dialysis    .  methadone (DOLOPHINE) 5 MG tablet Take 2.5 mg by mouth daily.    . mirtazapine (REMERON) 15 MG tablet Take 15 mg by mouth at bedtime.    . multivitamin (RENA-VIT) TABS tablet Take 1 tablet by mouth at bedtime.    Marland Kitchen omeprazole (PRILOSEC) 40 MG capsule Take 40 mg by mouth in the morning.    . Oxycodone HCl 10 MG TABS Take 1 tablet (10 mg total) by mouth every 12 (twelve) hours as needed (severe pain). 5 tablet 0  . promethazine (PHENERGAN) 12.5 MG tablet Take 12.5 mg by mouth every Monday, Wednesday, and Friday with hemodialysis. May take an additional 12.5 mg dose every 6 hours as needed for nausea    . promethazine (PHENERGAN) 6.25 MG/5ML syrup Place 12.5 mg into feeding tube daily as needed for nausea or vomiting.    . sertraline (ZOLOFT) 100 MG tablet Take 100 mg by mouth at bedtime. 100mg  with 25mg  at bedtime    . sertraline (ZOLOFT) 25 MG tablet Take 25 mg by mouth at bedtime. Take 25 mg with the 100 mg.     . sulfamethoxazole-trimethoprim (BACTRIM DS) 800-160 MG tablet Take 1 tablet by mouth Every Tuesday,Thursday,and Saturday with dialysis. 12 tablet 0  . traZODone (DESYREL) 50 MG tablet Take 50  mg by mouth at bedtime.    . vancomycin (VANCOCIN) 50 mg/mL oral solution Take 2.5 mLs (125 mg total) by mouth 4 (four) times daily. X 10 days 100 mL 0  . vitamin B-12 (CYANOCOBALAMIN) 100 MCG tablet Take 100 mcg by mouth daily.    . vitamin C (ASCORBIC ACID) 500 MG tablet Take 500 mg by mouth 2 (two) times daily.    . zidovudine (RETROVIR) 100 MG capsule Take 100 mg by mouth 3 (three) times daily.      Marland Kitchen azithromycin (ZITHROMAX) 500 MG tablet Take 1 tablet (500 mg total) by mouth daily. X 4 days 4 tablet 4   No facility-administered medications prior to visit.     Allergies:   Lactose, Lactose intolerance (gi), Other, Latex, and Penicillins   Past Medical History:  Diagnosis Date  . Anemia   . Anxiety   . Atrial flutter ([place]) 02/22/2015  . C. difficile colitis 10/10/11  . Chronic diarrhea     . Depression   . ESRD on hemodialysis ([place])   . GERD (gastroesophageal reflux disease)   . HIV (human immunodeficiency virus infection) ([place])   . Hypertension   . Insomnia   . Intracranial hemorrhage ([place])   . Muscle weakness (generalized)   . Pulmonary HTN ([place])   . Tachycardia   . TIA (transient ischemic attack)   . Traumatic hematoma of right upper arm 02/22/2015    Past Surgical History:  Procedure Laterality Date  . A/V FISTULAGRAM N/A 04/17/2016   Procedure: A/V Fistulagram - Right Upper;  Surgeon: Waynetta Sandy, MD;  Location: [facility];  Service: Cardiovascular;  Laterality: N/A;  . BIOPSY THYROID    . Dialysis Shunts     previous one removed from left arm and now present one in right arm  . DIALYSIS/PERMA CATHETER INSERTION Right 04/17/2016   Procedure: dialysis Catheter Insertion central veinous;  Surgeon: Waynetta Sandy, MD;  Location: [facility];  Service: Cardiovascular;  Laterality: Right;  . ESOPHAGOGASTRODUODENOSCOPY  12/15/2010   [place]k: erosive reflux esophagitis, MW tear, hiatal hernia (small), gastritis with no h.pylori, possibly nsaid related  . EXCISION OF BREAST BIOPSY Right 05/04/2012   Procedure: EXCISION OF BREAST BIOPSY;  Surgeon: Donato Heinz, MD;  Location: [facility];  Service: General;  Laterality: Right;  Right Excisional Breast Biopsy  . FISTULOGRAM Right 03/26/2014   Procedure: Right Arm Fistulogram with Venoplasty Right Subclavian Vein and Inominate Vein. Debridement Fistula Ulcer;  Surgeon: Conrad Willcox, MD;  Location: [facility];  Service: Vascular;  Laterality: Right;  . FISTULOGRAM Right 03/29/2017   Procedure: FISTULOGRAM COMPLEX RIGHT ARM with Balloon angioplasty;  Surgeon: Waynetta Sandy, MD;  Location: [facility];  Service: Vascular;  Laterality: Right;  . IR GENERIC HISTORICAL  05/19/2016   IR REMOVAL TUN CV CATH W/O FL 05/19/2016 Markus Daft, MD [facility]  . PERIPHERAL VASCULAR BALLOON ANGIOPLASTY Right  04/17/2016   Procedure: Peripheral Vascular Balloon Angioplasty;  Surgeon: Waynetta Sandy, MD;  Location: [facility];  Service: Cardiovascular;  Laterality: Right;  Central segment AV Fistula  . PERIPHERAL VASCULAR BALLOON ANGIOPLASTY Right 03/14/2018   Procedure: PERIPHERAL VASCULAR BALLOON ANGIOPLASTY;  Surgeon: Waynetta Sandy, MD;  Location: [facility];  Service: Cardiovascular;  Laterality: Right;  subclavian vein  . PERIPHERAL VASCULAR INTERVENTION Right 03/14/2018   Procedure: PERIPHERAL VASCULAR INTERVENTION;  Surgeon: Waynetta Sandy, MD;  Location: [facility];  Service: Cardiovascular;  Laterality: Right;  subclavian vein  . SHUNTOGRAM Right 10/19/2013   Procedure: FISTULOGRAM;  Surgeon: Conrad [place] Ridge, MD;  Location: [facility] C[facility] LAB;  Service: Cardiovascular;  Laterality: Right;  . TONSILLECTOMY    . VISCERAL VENOGRAPHY Right 03/14/2018   Procedure: CENTRAL VENOGRAPHY;  Surgeon: Waynetta Sandy, MD;  Location: [facility];  Service: Cardiovascular;  Laterality: Right;  upper ext fistula     Social History:  The patient  reports that she quit smoking about 36 years ago. She has never used smokeless tobacco. She reports that she does not drink alcohol or use drugs.   Family History:  The patient's family history is not on file. Noncontributory    ROS:  Please see the history of present illness. All other systems are reviewed and  Negative to the above problem except as noted.    PHYSICAL EXAM: VS:  There were no vitals taken for this visit.  GEN: Well nourished, well developed, in no acute distress  HEENT: normal  Neck: no JVD, carotid bruits, or masses Cardiac: RRR; Gr II/VI systolic Mumur LSB  No rubs, or gallops,no edema  Respiratory:  clear to auscultation bilaterally, normal work of breathing GI: soft, nontender, nondistended, + BS  No hepatomegaly  MS: no deformity Moving all extremities   Skin: warm and dry, no  rash Neuro:  Strength and sensation are intact Psych: euthymic mood, full affect   EKG:  EKG is not  ordered today.  No 5/19:  SR   First degree AV lock     Lipid Panel    Component Value Date/Time   CHOL 155 04/22/2017 1417   TRIG 108 04/22/2017 1417   HDL 54 04/22/2017 1417   CHOLHDL 2.9 04/22/2017 1417   VLDL 27 07/23/2015 1652   LDLCALC 81 04/22/2017 1417      Wt Readings from Last 3 Encounters:  07/22/19 101 lb 3.1 oz (45.9 kg)  07/19/19 110 lb (49.9 kg)  07/06/19 98 lb 4.8 oz (44.6 kg)      ASSESSMENT AND PLAN:  1  Paroxysmal atrial flutter Pt has had only one documented spell of atrial flutter in 2016   None since     I would follow   I would not start anticoagulation unless recurs.    2  HTN  BP is a little high today  Follow by PCP      WIll be available if pt develops arrhythmias in future   Follow in IM     Signed, Dorris Carnes, MD  08/04/2019 1:44 PM    [address], [place], [place]  [address] Phone: [phone]; Fax: [phone]

## 2019-08-16 DIAGNOSIS — F5101 Primary insomnia: Secondary | ICD-10-CM | POA: Diagnosis not present

## 2019-08-16 DIAGNOSIS — F331 Major depressive disorder, recurrent, moderate: Secondary | ICD-10-CM | POA: Diagnosis not present

## 2019-08-16 DIAGNOSIS — G4701 Insomnia due to medical condition: Secondary | ICD-10-CM | POA: Diagnosis not present

## 2019-08-16 DIAGNOSIS — F064 Anxiety disorder due to known physiological condition: Secondary | ICD-10-CM | POA: Diagnosis not present

## 2019-08-24 ENCOUNTER — Ambulatory Visit (INDEPENDENT_AMBULATORY_CARE_PROVIDER_SITE_OTHER): Payer: Medicare Other | Admitting: Vascular Surgery

## 2019-08-24 ENCOUNTER — Other Ambulatory Visit: Payer: Self-pay

## 2019-08-24 ENCOUNTER — Telehealth: Payer: Self-pay

## 2019-08-24 ENCOUNTER — Encounter: Payer: Self-pay | Admitting: Vascular Surgery

## 2019-08-24 VITALS — BP 87/57 | HR 70 | Temp 97.7°F | Resp 20 | Ht 62.0 in | Wt 101.0 lb

## 2019-08-24 DIAGNOSIS — N186 End stage renal disease: Secondary | ICD-10-CM

## 2019-08-24 DIAGNOSIS — Z992 Dependence on renal dialysis: Secondary | ICD-10-CM

## 2019-08-24 NOTE — Telephone Encounter (Signed)
Spoke to [place] regarding change in arrival time to [facility]. She is to arrive at 0630. NPO after MDN.

## 2019-08-24 NOTE — Progress Notes (Signed)
Patient is a 69 year old female who was sent for evaluation of a swollen right arm today.  She has had a prior right innominate stent 1 year ago.  She has a right upper arm AV fistula.  She states that the fistula is functioning but that the flow is not very good.  She also has noticed that the thrill is diminished.  Her dialysis today is Tuesday Thursday Saturday.  She resides in a skilled nursing facility.  She did see the hand surgeon Dr. Fredna Dow recently who thought she has a component of carpal tunnel syndrome contributing to her hand pain and numbness.  However, the swelling is obviously not related to this.  She has had some symptoms of steal in the past and this may be more carpal tunnel rather than related to her access.  Review of systems: She has no shortness of breath.  She has no chest pain.  Current Outpatient Medications on File Prior to Visit  Medication Sig Dispense Refill  . Biotin 5000 MCG CAPS Take 1 capsule by mouth daily.     . calcium acetate, Phos Binder, (PHOSLYRA) 667 MG/5ML SOLN Take 2,001 mg by mouth 3 (three) times daily with meals.    . calcium carbonate (TUMS - DOSED IN MG ELEMENTAL CALCIUM) 500 MG chewable tablet Chew 2 tablets by mouth in the morning and at bedtime.    . clopidogrel (PLAVIX) 75 MG tablet Take 1 tablet (75 mg total) by mouth daily. 30 tablet 3  . dolutegravir (TIVICAY) 50 MG tablet Take 50 mg by mouth daily.    . fentaNYL (DURAGESIC - DOSED MCG/HR) 50 MCG/HR Apply one patch every 72 hours for pain. Remove old patch. External use only. Rotate sites. 10 patch 0  . lamivudine (EPIVIR) 100 MG tablet Take 50 mg by mouth daily.    . Lidocaine HCl (ASPERCREME W/LIDOCAINE) 4 % CREA Apply 1 application topically 2 (two) times daily as needed (to bilateral wrists for pain).     Marland Kitchen lidocaine-prilocaine (EMLA) cream Apply 1 application topically Every Tuesday,Thursday,and Saturday with dialysis. 1 hour prior to dialysis    . methadone (DOLOPHINE) 5 MG tablet Take 2.5  mg by mouth daily.    . mirtazapine (REMERON) 15 MG tablet Take 15 mg by mouth at bedtime.    . multivitamin (RENA-VIT) TABS tablet Take 1 tablet by mouth at bedtime.    Marland Kitchen omeprazole (PRILOSEC) 40 MG capsule Take 40 mg by mouth in the morning.    . Oxycodone HCl 10 MG TABS Take 1 tablet (10 mg total) by mouth every 12 (twelve) hours as needed (severe pain). 5 tablet 0  . promethazine (PHENERGAN) 12.5 MG tablet Take 12.5 mg by mouth every Monday, Wednesday, and Friday with hemodialysis. May take an additional 12.5 mg dose every 6 hours as needed for nausea    . SANTYL ointment Apply topically.    . sertraline (ZOLOFT) 100 MG tablet Take 100 mg by mouth at bedtime. 100mg  with 25mg  at bedtime    . sertraline (ZOLOFT) 25 MG tablet Take 25 mg by mouth at bedtime. Take 25 mg with the 100 mg.     . traZODone (DESYREL) 50 MG tablet Take 50 mg by mouth at bedtime.    . vitamin B-12 (CYANOCOBALAMIN) 100 MCG tablet Take 100 mcg by mouth daily.    . vitamin C (ASCORBIC ACID) 500 MG tablet Take 500 mg by mouth 2 (two) times daily.    . zidovudine (RETROVIR) 100 MG capsule Take 100  mg by mouth 3 (three) times daily.      . Zinc 220 (50 Zn) MG CAPS Take 1 capsule by mouth every morning.     No current facility-administered medications on file prior to visit.    Social History   Socioeconomic History  . Marital status: Widowed    Spouse name: Not on file  . Number of children: 1  . Years of education: Not on file  . Highest education level: Not on file  Occupational History  . Not on file  Tobacco Use  . Smoking status: Former Smoker    Quit date: 03/12/1983    Years since quitting: 36.4  . Smokeless tobacco: Never Used  Vaping Use  . Vaping Use: Never used  Substance and Sexual Activity  . Alcohol use: No    Alcohol/week: 0.0 standard drinks  . Drug use: No  . Sexual activity: Not Currently  Other Topics Concern  . Not on file  Social History Narrative  . Not on file   Social  Determinants of Health   Financial Resource Strain:   . Difficulty of Paying Living Expenses:   Food Insecurity:   . Worried About Charity fundraiser in the Last Year:   . Arboriculturist in the Last Year:   Transportation Needs:   . Film/video editor (Medical):   Marland Kitchen Lack of Transportation (Non-Medical):   Physical Activity:   . Days of Exercise per Week:   . Minutes of Exercise per Session:   Stress:   . Feeling of Stress :   Social Connections:   . Frequency of Communication with Friends and Family:   . Frequency of Social Gatherings with Friends and Family:   . Attends Religious Services:   . Active Member of Clubs or Organizations:   . Attends Archivist Meetings:   Marland Kitchen Marital Status:   Intimate Partner Violence:   . Fear of Current or Ex-Partner:   . Emotionally Abused:   Marland Kitchen Physically Abused:   . Sexually Abused:     Past Medical History:  Diagnosis Date  . Anemia   . Anxiety   . Atrial flutter ([place]) 02/22/2015  . C. difficile colitis 10/10/11  . Chronic diarrhea   . Depression   . ESRD on hemodialysis ([place])   . GERD (gastroesophageal reflux disease)   . HIV (human immunodeficiency virus infection) ([place])   . Hypertension   . Insomnia   . Intracranial hemorrhage ([place])   . Muscle weakness (generalized)   . Pulmonary HTN ([place])   . Tachycardia   . TIA (transient ischemic attack)   . Traumatic hematoma of right upper arm 02/22/2015    Physical exam:  Vitals:   08/24/19 0818  BP: (!) 87/57  Pulse: 70  Resp: 20  Temp: 97.7 F (36.5 C)  SpO2: 98%  Weight: 101 lb (45.8 kg)  Height: 5\' 2"  (1.575 m)    Right upper extremity pulsatile right upper arm AV fistula right arm is edematous from the shoulder to the hand approximately 2-1/2 times the size of the left arm.  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  Plan: Patient  will be scheduled for right arm fistulogram by Dr. Donzetta Matters on August 28, 2019.  Risk benefits possible complications of procedure details were discussed with the patient today including but not limited to bleeding vessel injury contrast reaction.  She understands and agrees to proceed.  I did discuss with her that if her central venous system has reoccluded we may need to ligate her AV fistula and potentially start with a new access.  She would most likely need a thigh graft as her next step.  Ruta Hinds, MD [facility] Office: [phone]

## 2019-08-28 ENCOUNTER — Ambulatory Visit (HOSPITAL_COMMUNITY)
Admission: RE | Disposition: A | Discharge status: Home / Self Care | Payer: Self-pay | Source: Home / Self Care | Attending: Vascular Surgery

## 2019-08-28 ENCOUNTER — Encounter (HOSPITAL_COMMUNITY): Payer: Self-pay | Admitting: Vascular Surgery

## 2019-08-28 ENCOUNTER — Other Ambulatory Visit: Payer: Self-pay

## 2019-08-28 ENCOUNTER — Ambulatory Visit (HOSPITAL_COMMUNITY)
Admission: RE | Admit: 2019-08-28 | Discharge: 2019-08-28 | Disposition: A | Discharge status: Home / Self Care | Payer: Medicare Other | Attending: Vascular Surgery | Admitting: Vascular Surgery

## 2019-08-28 DIAGNOSIS — G4700 Insomnia, unspecified: Secondary | ICD-10-CM | POA: Insufficient documentation

## 2019-08-28 DIAGNOSIS — I272 Pulmonary hypertension, unspecified: Secondary | ICD-10-CM | POA: Insufficient documentation

## 2019-08-28 DIAGNOSIS — Z8679 Personal history of other diseases of the circulatory system: Secondary | ICD-10-CM | POA: Insufficient documentation

## 2019-08-28 DIAGNOSIS — T82898A Other specified complication of vascular prosthetic devices, implants and grafts, initial encounter: Secondary | ICD-10-CM

## 2019-08-28 DIAGNOSIS — I4891 Unspecified atrial fibrillation: Secondary | ICD-10-CM | POA: Diagnosis not present

## 2019-08-28 DIAGNOSIS — Z8673 Personal history of transient ischemic attack (TIA), and cerebral infarction without residual deficits: Secondary | ICD-10-CM | POA: Diagnosis not present

## 2019-08-28 DIAGNOSIS — T82858A Stenosis of vascular prosthetic devices, implants and grafts, initial encounter: Secondary | ICD-10-CM | POA: Diagnosis not present

## 2019-08-28 DIAGNOSIS — N186 End stage renal disease: Secondary | ICD-10-CM

## 2019-08-28 DIAGNOSIS — Z7902 Long term (current) use of antithrombotics/antiplatelets: Secondary | ICD-10-CM | POA: Insufficient documentation

## 2019-08-28 DIAGNOSIS — Z992 Dependence on renal dialysis: Secondary | ICD-10-CM

## 2019-08-28 DIAGNOSIS — Y832 Surgical operation with anastomosis, bypass or graft as the cause of abnormal reaction of the patient, or of later complication, without mention of misadventure at the time of the procedure: Secondary | ICD-10-CM | POA: Insufficient documentation

## 2019-08-28 DIAGNOSIS — Z91040 Latex allergy status: Secondary | ICD-10-CM | POA: Diagnosis not present

## 2019-08-28 DIAGNOSIS — K219 Gastro-esophageal reflux disease without esophagitis: Secondary | ICD-10-CM | POA: Diagnosis not present

## 2019-08-28 DIAGNOSIS — Z87891 Personal history of nicotine dependence: Secondary | ICD-10-CM | POA: Diagnosis not present

## 2019-08-28 DIAGNOSIS — I120 Hypertensive chronic kidney disease with stage 5 chronic kidney disease or end stage renal disease: Secondary | ICD-10-CM | POA: Insufficient documentation

## 2019-08-28 DIAGNOSIS — Z21 Asymptomatic human immunodeficiency virus [HIV] infection status: Secondary | ICD-10-CM | POA: Diagnosis not present

## 2019-08-28 DIAGNOSIS — Z79899 Other long term (current) drug therapy: Secondary | ICD-10-CM | POA: Insufficient documentation

## 2019-08-28 DIAGNOSIS — Z880 Allergy status to penicillin: Secondary | ICD-10-CM | POA: Insufficient documentation

## 2019-08-28 DIAGNOSIS — I4892 Unspecified atrial flutter: Secondary | ICD-10-CM | POA: Insufficient documentation

## 2019-08-28 DIAGNOSIS — F419 Anxiety disorder, unspecified: Secondary | ICD-10-CM | POA: Insufficient documentation

## 2019-08-28 HISTORY — PX: PERIPHERAL VASCULAR BALLOON ANGIOPLASTY: CATH118281

## 2019-08-28 HISTORY — PX: A/V FISTULAGRAM: CATH118298

## 2019-08-28 LAB — POCT I-STAT, CHEM 8
BUN: 30 mg/dL — ABNORMAL HIGH (ref 8–23)
Calcium, Ion: 1.3 mmol/L (ref 1.15–1.40)
Chloride: 98 mmol/L (ref 98–111)
Creatinine, Ser: 6.6 mg/dL — ABNORMAL HIGH (ref 0.44–1.00)
Glucose, Bld: 79 mg/dL (ref 70–99)
HCT: 27 % — ABNORMAL LOW (ref 36.0–46.0)
Hemoglobin: 9.2 g/dL — ABNORMAL LOW (ref 12.0–15.0)
Potassium: 3.7 mmol/L (ref 3.5–5.1)
Sodium: 141 mmol/L (ref 135–145)
TCO2: 30 mmol/L (ref 22–32)

## 2019-08-28 SURGERY — A/V FISTULAGRAM
Anesthesia: LOCAL | Laterality: Right

## 2019-08-28 MED ORDER — LIDOCAINE HCL (PF) 1 % IJ SOLN
INTRAMUSCULAR | Status: DC | PRN
  Administered 2019-08-28: 2 mL

## 2019-08-28 MED ORDER — SODIUM CHLORIDE 0.9% FLUSH: 3.0000 mL | Freq: Two times a day (BID) | INTRAVENOUS | Status: DC

## 2019-08-28 MED ORDER — SODIUM CHLORIDE 0.9% FLUSH: 3.0000 mL | INTRAVENOUS | Status: DC | PRN

## 2019-08-28 MED ORDER — MIDAZOLAM HCL 5 MG/5ML IJ SOLN
INTRAMUSCULAR | Status: AC
  Filled 2019-08-28: qty 5

## 2019-08-28 MED ORDER — HEPARIN (PORCINE) IN NACL 1000-0.9 UT/500ML-% IV SOLN
INTRAVENOUS | Status: DC | PRN
  Administered 2019-08-28: 500 mL

## 2019-08-28 MED ORDER — SODIUM CHLORIDE 0.9 % IV SOLN: 250.0000 mL | INTRAVENOUS | Status: DC | PRN

## 2019-08-28 MED ORDER — HEPARIN (PORCINE) IN NACL 1000-0.9 UT/500ML-% IV SOLN
INTRAVENOUS | Status: AC
  Filled 2019-08-28: qty 1000

## 2019-08-28 MED ORDER — FENTANYL CITRATE (PF) 100 MCG/2ML IJ SOLN
INTRAMUSCULAR | Status: AC
  Filled 2019-08-28: qty 2

## 2019-08-28 MED ORDER — MIDAZOLAM HCL 2 MG/2ML IJ SOLN
INTRAMUSCULAR | Status: DC | PRN
  Administered 2019-08-28: 1 mg via INTRAVENOUS

## 2019-08-28 MED ORDER — FENTANYL CITRATE (PF) 100 MCG/2ML IJ SOLN
INTRAMUSCULAR | Status: DC | PRN
  Administered 2019-08-28 (×2): 25 ug via INTRAVENOUS
  Administered 2019-08-28: 50 ug via INTRAVENOUS

## 2019-08-28 SURGICAL SUPPLY — 18 items
BAG SNAP BAND KOVER 36X36 (MISCELLANEOUS) ×2 IMPLANT
BALLN LUTONIX AV 10X60X75 (BALLOONS) ×2
BALLN MUSTANG 10X80X75 (BALLOONS) ×2
BALLN MUSTANG 12X80X75 (BALLOONS) ×2
BALLOON LUTONIX AV 10X60X75 (BALLOONS) IMPLANT
BALLOON MUSTANG 10X80X75 (BALLOONS) IMPLANT
BALLOON MUSTANG 12X80X75 (BALLOONS) IMPLANT
CATH ANGIO 5F BER2 65CM (CATHETERS) ×3 IMPLANT
COVER DOME SNAP 22 D (MISCELLANEOUS) ×2 IMPLANT
KIT ENCORE 26 ADVANTAGE (KITS) ×1 IMPLANT
KIT MICROPUNCTURE NIT STIFF (SHEATH) ×1 IMPLANT
PROTECTION STATION PRESSURIZED (MISCELLANEOUS) ×2
SHEATH PINNACLE R/O II 7F 4CM (SHEATH) ×1 IMPLANT
SHEATH PROBE COVER 6X72 (BAG) ×2 IMPLANT
STATION PROTECTION PRESSURIZED (MISCELLANEOUS) ×1 IMPLANT
STOPCOCK MORSE 400PSI 3WAY (MISCELLANEOUS) ×2 IMPLANT
TRAY PV CATH (CUSTOM PROCEDURE TRAY) ×2 IMPLANT
TUBING CIL FLEX 10 FLL-RA (TUBING) ×2 IMPLANT

## 2019-08-28 NOTE — Discharge Instructions (Signed)

## 2019-08-28 NOTE — H&P (Signed)
   History and Physical Update  The patient was interviewed and re-examined.  The patient's previous History and Physical has been reviewed and is unchanged from recent office visit. Plan for right arm fistulogram, possible groin approach.   Annaliyah Willig C. Donzetta Matters, MD [facility] Office: [phone] Pager: [phone]  08/28/2019, 7:26 AM

## 2019-08-28 NOTE — Op Note (Signed)
° ° °  Patient name: [name] MRN: [id number] DOB: 1950-11-20 Sex: female  08/28/2019 Pre-operative Diagnosis: End-stage renal disease, right arm swelling Post-operative diagnosis:  Same Surgeon:  Eda Paschal. Donzetta Matters, MD Procedure Performed: 1.  Ultrasound-guided cannulation right arm AV fistula 2.  Right upper extremity fistulogram 3.  Drug-coated balloon angioplasty right innominate stent with 10 x 60 mm lLutonix 4.  Moderate sedation with fentanyl and Versed for 28 minutes   Indications: 68 year old female with end-stage renal disease.  She has a right innominate stent placed in the past with previous balloon angioplasty of this.  She now has swelling in the right upper extremity.  She continues to use the fistula for dialysis.  Findings: There were 2 areas proximally and distally in the stent proximally 75% stenosis.  After completion these are resolved to less than 20%.   Procedure:  The patient was identified in the holding area and taken to room 8.  The patient was then placed supine on the table and prepped and draped in the usual sterile fashion.  A time out was called.  Ultrasound was used to evaluate the right arm AV fistula.  Images saved the permanent record.  We cannulated with direct ultrasound visualization micropuncture needle followed wire sheath.  Right upper extremity fistulogram was performed.  We exchanged over Bentson wire for 7 French sheath.  We crossed the lesion confirmed intraluminal access.  We ballooned with 10 mm plain balloon followed by 10 x 6 cm drug-coated balloon.  We then postdilated with a 12 mm x 6 cm plain balloon.  Completion demonstrated minimal residual stenosis there was much better flow in the fistula by palpation.  We removed the wire sheath we suture-ligated the site.  She tolerated procedure without any complication.  Contrast: 55 cc    [name] C. Donzetta Matters, MD [facility] Office: [phone] Pager:  [phone]

## 2019-08-29 MED FILL — Heparin Sod (Porcine)-NaCl IV Soln 1000 Unit/500ML-0.9%: INTRAVENOUS | Qty: 500 | Status: AC

## 2019-08-30 ENCOUNTER — Telehealth: Payer: Self-pay

## 2019-08-30 DIAGNOSIS — G629 Polyneuropathy, unspecified: Secondary | ICD-10-CM | POA: Diagnosis not present

## 2019-08-30 DIAGNOSIS — N189 Chronic kidney disease, unspecified: Secondary | ICD-10-CM | POA: Diagnosis not present

## 2019-08-30 DIAGNOSIS — G5603 Carpal tunnel syndrome, bilateral upper limbs: Secondary | ICD-10-CM | POA: Diagnosis not present

## 2019-08-30 NOTE — Telephone Encounter (Signed)
Call received from Ramiro Harvest, PA at renal office in [place]. Reports pts arm was wrapped too tight at nursing home when returned after fistulogram with angioplasty on Monday. Noted during dialysis on yesterday her access was clotted. IR was contacted and recommended to contact Dr. Donzetta Matters for recommendations. Pt has not had treatment since Saturday. Spoke with Arbie Cookey @ [facility] and scheduled pt to follow up with Dr. Donzetta Matters on 09/01/19 at 1140am. Voiced understanding.

## 2019-09-01 ENCOUNTER — Ambulatory Visit: Payer: Medicare Other | Admitting: Vascular Surgery

## 2019-09-01 DIAGNOSIS — Z992 Dependence on renal dialysis: Secondary | ICD-10-CM | POA: Diagnosis not present

## 2019-09-01 DIAGNOSIS — D472 Monoclonal gammopathy: Secondary | ICD-10-CM | POA: Diagnosis not present

## 2019-09-01 DIAGNOSIS — D631 Anemia in chronic kidney disease: Secondary | ICD-10-CM | POA: Diagnosis not present

## 2019-09-01 DIAGNOSIS — N2581 Secondary hyperparathyroidism of renal origin: Secondary | ICD-10-CM | POA: Diagnosis not present

## 2019-09-01 DIAGNOSIS — N186 End stage renal disease: Secondary | ICD-10-CM | POA: Diagnosis not present

## 2019-09-01 DIAGNOSIS — D509 Iron deficiency anemia, unspecified: Secondary | ICD-10-CM | POA: Diagnosis not present

## 2019-09-02 DIAGNOSIS — N2581 Secondary hyperparathyroidism of renal origin: Secondary | ICD-10-CM | POA: Diagnosis not present

## 2019-09-02 DIAGNOSIS — D472 Monoclonal gammopathy: Secondary | ICD-10-CM | POA: Diagnosis not present

## 2019-09-02 DIAGNOSIS — D631 Anemia in chronic kidney disease: Secondary | ICD-10-CM | POA: Diagnosis not present

## 2019-09-02 DIAGNOSIS — Z992 Dependence on renal dialysis: Secondary | ICD-10-CM | POA: Diagnosis not present

## 2019-09-02 DIAGNOSIS — D509 Iron deficiency anemia, unspecified: Secondary | ICD-10-CM | POA: Diagnosis not present

## 2019-09-02 DIAGNOSIS — N186 End stage renal disease: Secondary | ICD-10-CM | POA: Diagnosis not present

## 2019-09-05 ENCOUNTER — Encounter (HOSPITAL_COMMUNITY): Payer: Self-pay | Admitting: Vascular Surgery

## 2019-09-05 ENCOUNTER — Other Ambulatory Visit (HOSPITAL_COMMUNITY): Payer: Medicare Other

## 2019-09-05 DIAGNOSIS — Z992 Dependence on renal dialysis: Secondary | ICD-10-CM | POA: Diagnosis not present

## 2019-09-05 DIAGNOSIS — N2581 Secondary hyperparathyroidism of renal origin: Secondary | ICD-10-CM | POA: Diagnosis not present

## 2019-09-05 DIAGNOSIS — D631 Anemia in chronic kidney disease: Secondary | ICD-10-CM | POA: Diagnosis not present

## 2019-09-05 DIAGNOSIS — D509 Iron deficiency anemia, unspecified: Secondary | ICD-10-CM | POA: Diagnosis not present

## 2019-09-05 DIAGNOSIS — D472 Monoclonal gammopathy: Secondary | ICD-10-CM | POA: Diagnosis not present

## 2019-09-05 DIAGNOSIS — N186 End stage renal disease: Secondary | ICD-10-CM | POA: Diagnosis not present

## 2019-09-06 ENCOUNTER — Other Ambulatory Visit: Payer: Self-pay

## 2019-09-06 ENCOUNTER — Inpatient Hospital Stay (HOSPITAL_COMMUNITY): Payer: Medicare Other | Attending: Hematology

## 2019-09-06 DIAGNOSIS — R7989 Other specified abnormal findings of blood chemistry: Secondary | ICD-10-CM | POA: Insufficient documentation

## 2019-09-06 DIAGNOSIS — Z21 Asymptomatic human immunodeficiency virus [HIV] infection status: Secondary | ICD-10-CM | POA: Diagnosis not present

## 2019-09-06 DIAGNOSIS — E559 Vitamin D deficiency, unspecified: Secondary | ICD-10-CM | POA: Diagnosis not present

## 2019-09-06 DIAGNOSIS — D61818 Other pancytopenia: Secondary | ICD-10-CM | POA: Diagnosis not present

## 2019-09-06 DIAGNOSIS — K469 Unspecified abdominal hernia without obstruction or gangrene: Secondary | ICD-10-CM | POA: Insufficient documentation

## 2019-09-06 DIAGNOSIS — N186 End stage renal disease: Secondary | ICD-10-CM | POA: Insufficient documentation

## 2019-09-06 DIAGNOSIS — G8929 Other chronic pain: Secondary | ICD-10-CM | POA: Insufficient documentation

## 2019-09-06 DIAGNOSIS — R109 Unspecified abdominal pain: Secondary | ICD-10-CM | POA: Insufficient documentation

## 2019-09-06 LAB — CBC WITH DIFFERENTIAL/PLATELET
Abs Immature Granulocytes: 0 10*3/uL (ref 0.00–0.07)
Basophils Absolute: 0 10*3/uL (ref 0.0–0.1)
Basophils Relative: 0 %
Eosinophils Absolute: 0.1 10*3/uL (ref 0.0–0.5)
Eosinophils Relative: 3 %
HCT: 23.5 % — ABNORMAL LOW (ref 36.0–46.0)
Hemoglobin: 7 g/dL — ABNORMAL LOW (ref 12.0–15.0)
Immature Granulocytes: 0 %
Lymphocytes Relative: 38 %
Lymphs Abs: 1.1 10*3/uL (ref 0.7–4.0)
MCH: 34.3 pg — ABNORMAL HIGH (ref 26.0–34.0)
MCHC: 29.8 g/dL — ABNORMAL LOW (ref 30.0–36.0)
MCV: 115.2 fL — ABNORMAL HIGH (ref 80.0–100.0)
Monocytes Absolute: 0.3 10*3/uL (ref 0.1–1.0)
Monocytes Relative: 10 %
Neutro Abs: 1.4 10*3/uL — ABNORMAL LOW (ref 1.7–7.7)
Neutrophils Relative %: 49 %
Platelets: 128 10*3/uL — ABNORMAL LOW (ref 150–400)
RBC: 2.04 MIL/uL — ABNORMAL LOW (ref 3.87–5.11)
RDW: 14.7 % (ref 11.5–15.5)
WBC: 2.9 10*3/uL — ABNORMAL LOW (ref 4.0–10.5)
nRBC: 0 % (ref 0.0–0.2)

## 2019-09-06 LAB — COMPREHENSIVE METABOLIC PANEL
ALT: 9 U/L (ref 0–44)
AST: 16 U/L (ref 15–41)
Albumin: 3.6 g/dL (ref 3.5–5.0)
Alkaline Phosphatase: 138 U/L — ABNORMAL HIGH (ref 38–126)
Anion gap: 10 (ref 5–15)
BUN: 30 mg/dL — ABNORMAL HIGH (ref 8–23)
CO2: 32 mmol/L (ref 22–32)
Calcium: 10.1 mg/dL (ref 8.9–10.3)
Chloride: 97 mmol/L — ABNORMAL LOW (ref 98–111)
Creatinine, Ser: 5.22 mg/dL — ABNORMAL HIGH (ref 0.44–1.00)
GFR calc Af Amer: 9 mL/min — ABNORMAL LOW (ref >60)
GFR calc non Af Amer: 8 mL/min — ABNORMAL LOW (ref >60)
Glucose, Bld: 87 mg/dL (ref 70–99)
Potassium: 4.3 mmol/L (ref 3.5–5.1)
Sodium: 139 mmol/L (ref 135–145)
Total Bilirubin: 0.5 mg/dL (ref 0.3–1.2)
Total Protein: 8.3 g/dL — ABNORMAL HIGH (ref 6.5–8.1)

## 2019-09-06 LAB — LACTATE DEHYDROGENASE: LDH: 106 U/L (ref 98–192)

## 2019-09-06 LAB — IRON AND TIBC
Iron: 41 ug/dL (ref 28–170)
Saturation Ratios: 24 % (ref 10.4–31.8)
TIBC: 168 ug/dL — ABNORMAL LOW (ref 250–450)
UIBC: 127 ug/dL

## 2019-09-06 LAB — VITAMIN D 25 HYDROXY (VIT D DEFICIENCY, FRACTURES): Vit D, 25-Hydroxy: 10.65 ng/mL — ABNORMAL LOW (ref 30–100)

## 2019-09-06 LAB — FERRITIN: Ferritin: 998 ng/mL — ABNORMAL HIGH (ref 11–307)

## 2019-09-06 LAB — VITAMIN B12: Vitamin B-12: 1220 pg/mL — ABNORMAL HIGH (ref 180–914)

## 2019-09-06 LAB — FOLATE: Folate: 34.9 ng/mL (ref >5.9)

## 2019-09-07 DIAGNOSIS — D631 Anemia in chronic kidney disease: Secondary | ICD-10-CM | POA: Diagnosis not present

## 2019-09-07 DIAGNOSIS — D472 Monoclonal gammopathy: Secondary | ICD-10-CM | POA: Diagnosis not present

## 2019-09-07 DIAGNOSIS — N2581 Secondary hyperparathyroidism of renal origin: Secondary | ICD-10-CM | POA: Diagnosis not present

## 2019-09-07 DIAGNOSIS — D509 Iron deficiency anemia, unspecified: Secondary | ICD-10-CM | POA: Diagnosis not present

## 2019-09-07 DIAGNOSIS — N186 End stage renal disease: Secondary | ICD-10-CM | POA: Diagnosis not present

## 2019-09-07 DIAGNOSIS — Z992 Dependence on renal dialysis: Secondary | ICD-10-CM | POA: Diagnosis not present

## 2019-09-09 DIAGNOSIS — Z992 Dependence on renal dialysis: Secondary | ICD-10-CM | POA: Diagnosis not present

## 2019-09-09 DIAGNOSIS — N2581 Secondary hyperparathyroidism of renal origin: Secondary | ICD-10-CM | POA: Diagnosis not present

## 2019-09-09 DIAGNOSIS — D631 Anemia in chronic kidney disease: Secondary | ICD-10-CM | POA: Diagnosis not present

## 2019-09-09 DIAGNOSIS — D509 Iron deficiency anemia, unspecified: Secondary | ICD-10-CM | POA: Diagnosis not present

## 2019-09-09 DIAGNOSIS — N186 End stage renal disease: Secondary | ICD-10-CM | POA: Diagnosis not present

## 2019-09-09 DIAGNOSIS — D472 Monoclonal gammopathy: Secondary | ICD-10-CM | POA: Diagnosis not present

## 2019-09-11 LAB — METHYLMALONIC ACID, SERUM: Methylmalonic Acid, Quantitative: 477 nmol/L — ABNORMAL HIGH (ref 0–378)

## 2019-09-12 DIAGNOSIS — D631 Anemia in chronic kidney disease: Secondary | ICD-10-CM | POA: Diagnosis not present

## 2019-09-12 DIAGNOSIS — N186 End stage renal disease: Secondary | ICD-10-CM | POA: Diagnosis not present

## 2019-09-12 DIAGNOSIS — D472 Monoclonal gammopathy: Secondary | ICD-10-CM | POA: Diagnosis not present

## 2019-09-12 DIAGNOSIS — Z992 Dependence on renal dialysis: Secondary | ICD-10-CM | POA: Diagnosis not present

## 2019-09-12 DIAGNOSIS — N2581 Secondary hyperparathyroidism of renal origin: Secondary | ICD-10-CM | POA: Diagnosis not present

## 2019-09-12 DIAGNOSIS — D509 Iron deficiency anemia, unspecified: Secondary | ICD-10-CM | POA: Diagnosis not present

## 2019-09-13 ENCOUNTER — Other Ambulatory Visit: Payer: Self-pay

## 2019-09-13 ENCOUNTER — Inpatient Hospital Stay (HOSPITAL_BASED_OUTPATIENT_CLINIC_OR_DEPARTMENT_OTHER): Payer: Medicare Other | Admitting: Nurse Practitioner

## 2019-09-13 VITALS — BP 168/81 | HR 77 | Temp 97.9°F | Resp 18 | Wt 95.7 lb

## 2019-09-13 DIAGNOSIS — K469 Unspecified abdominal hernia without obstruction or gangrene: Secondary | ICD-10-CM | POA: Diagnosis not present

## 2019-09-13 DIAGNOSIS — N186 End stage renal disease: Secondary | ICD-10-CM | POA: Diagnosis not present

## 2019-09-13 DIAGNOSIS — E538 Deficiency of other specified B group vitamins: Secondary | ICD-10-CM | POA: Diagnosis not present

## 2019-09-13 DIAGNOSIS — Z21 Asymptomatic human immunodeficiency virus [HIV] infection status: Secondary | ICD-10-CM | POA: Diagnosis not present

## 2019-09-13 DIAGNOSIS — G8929 Other chronic pain: Secondary | ICD-10-CM | POA: Diagnosis not present

## 2019-09-13 DIAGNOSIS — D61818 Other pancytopenia: Secondary | ICD-10-CM | POA: Diagnosis not present

## 2019-09-13 DIAGNOSIS — R109 Unspecified abdominal pain: Secondary | ICD-10-CM | POA: Diagnosis not present

## 2019-09-13 MED ORDER — CYANOCOBALAMIN 1000 MCG/ML IJ SOLN
INTRAMUSCULAR | Status: AC
  Filled 2019-09-13: qty 1

## 2019-09-13 MED ORDER — CYANOCOBALAMIN 1000 MCG/ML IJ SOLN
1000.0000 ug | Freq: Once | INTRAMUSCULAR | Status: AC
  Administered 2019-09-13: 1000 ug via INTRAMUSCULAR

## 2019-09-13 NOTE — Progress Notes (Signed)
[address] [place], [place] [address]   CLINIC:  Medical Oncology/Hematology  PCP:  Hilbert Corrigan, [place] [address] [phone]   REASON FOR VISIT: Follow-up for pancytopenia   CURRENT THERAPY: Observation   INTERVAL HISTORY:  [name] 69 y.o. female returns for routine follow-up for pancytopenia.  Patient reports she is feeling more fatigued lately.  She has dialysis on Tuesday Thursday Saturdays now.  She denies any bright red bleeding per rectum or melena.  She denies any easy bruising or bleeding.  She denies any recurrent infections. Denies any nausea, vomiting, or diarrhea. Denies any new pains. Had not noticed any recent bleeding such as epistaxis, hematuria or hematochezia. Denies recent chest pain on exertion, shortness of breath on minimal exertion, pre-syncopal episodes, or palpitations. Denies any numbness or tingling in hands or feet. Denies any recent fevers, infections, or recent hospitalizations. Patient reports appetite at 0% and energy level at 0%.     REVIEW OF SYSTEMS:  Review of Systems  Constitutional: Positive for fatigue.  Respiratory: Positive for cough.   Neurological: Positive for dizziness and numbness.  All other systems reviewed and are negative.    PAST MEDICAL/SURGICAL HISTORY:  Past Medical History:  Diagnosis Date  . Anemia   . Anxiety   . Atrial flutter ([place]) 02/22/2015  . C. difficile colitis 10/10/11  . Chronic diarrhea   . Depression   . ESRD on hemodialysis ([place])   . GERD (gastroesophageal reflux disease)   . HIV (human immunodeficiency virus infection) ([place])   . Hypertension   . Insomnia   . Intracranial hemorrhage ([place])   . Muscle weakness (generalized)   . Pulmonary HTN ([place])   . Tachycardia   . TIA (transient ischemic attack)   . Traumatic hematoma of right upper arm 02/22/2015   Past Surgical History:  Procedure Laterality Date  . A/V FISTULAGRAM N/A  04/17/2016   Procedure: A/V Fistulagram - Right Upper;  Surgeon: Waynetta Sandy, MD;  Location: [facility];  Service: Cardiovascular;  Laterality: N/A;  . A/V FISTULAGRAM Right 08/28/2019   Procedure: A/V FISTULAGRAM;  Surgeon: Waynetta Sandy, MD;  Location: [facility];  Service: Cardiovascular;  Laterality: Right;  . BIOPSY THYROID    . Dialysis Shunts     previous one removed from left arm and now present one in right arm  . DIALYSIS/PERMA CATHETER INSERTION Right 04/17/2016   Procedure: dialysis Catheter Insertion central veinous;  Surgeon: Waynetta Sandy, MD;  Location: [facility];  Service: Cardiovascular;  Laterality: Right;  . ESOPHAGOGASTRODUODENOSCOPY  12/15/2010   [place]ourk: erosive reflux esophagitis, MW tear, hiatal hernia (small), gastritis with no h.pylori, possibly nsaid related  . EXCISION OF BREAST BIOPSY Right 05/04/2012   Procedure: EXCISION OF BREAST BIOPSY;  Surgeon: Donato Heinz, MD;  Location: [facility];  Service: General;  Laterality: Right;  Right Excisional Breast Biopsy  . FISTULOGRAM Right 03/26/2014   Procedure: Right Arm Fistulogram with Venoplasty Right Subclavian Vein and Inominate Vein. Debridement Fistula Ulcer;  Surgeon: Conrad Annetta, MD;  Location: [facility];  Service: Vascular;  Laterality: Right;  . FISTULOGRAM Right 03/29/2017   Procedure: FISTULOGRAM COMPLEX RIGHT ARM with Balloon angioplasty;  Surgeon: Waynetta Sandy, MD;  Location: [facility];  Service: Vascular;  Laterality: Right;  . IR GENERIC HISTORICAL  05/19/2016   IR REMOVAL TUN CV CATH W/O FL 05/19/2016 Markus Daft, MD [facility]  .  PERIPHERAL VASCULAR BALLOON ANGIOPLASTY Right 04/17/2016   Procedure: Peripheral Vascular Balloon Angioplasty;  Surgeon: Waynetta Sandy, MD;  Location: [facility];  Service: Cardiovascular;  Laterality: Right;  Central segment AV Fistula  . PERIPHERAL VASCULAR BALLOON ANGIOPLASTY Right 03/14/2018    Procedure: PERIPHERAL VASCULAR BALLOON ANGIOPLASTY;  Surgeon: Waynetta Sandy, MD;  Location: [facility];  Service: Cardiovascular;  Laterality: Right;  subclavian vein  . PERIPHERAL VASCULAR BALLOON ANGIOPLASTY Right 08/28/2019   Procedure: PERIPHERAL VASCULAR BALLOON ANGIOPLASTY;  Surgeon: Waynetta Sandy, MD;  Location: [facility];  Service: Cardiovascular;  Laterality: Right;  upper arm  . PERIPHERAL VASCULAR INTERVENTION Right 03/14/2018   Procedure: PERIPHERAL VASCULAR INTERVENTION;  Surgeon: Waynetta Sandy, MD;  Location: [facility];  Service: Cardiovascular;  Laterality: Right;  subclavian vein  . SHUNTOGRAM Right 10/19/2013   Procedure: FISTULOGRAM;  Surgeon: Conrad Reserve, MD;  Location: [facility];  Service: Cardiovascular;  Laterality: Right;  . TONSILLECTOMY    . VISCERAL VENOGRAPHY Right 03/14/2018   Procedure: CENTRAL VENOGRAPHY;  Surgeon: Waynetta Sandy, MD;  Location: [facility];  Service: Cardiovascular;  Laterality: Right;  upper ext fistula     SOCIAL HISTORY:  Social History   Socioeconomic History  . Marital status: Widowed    Spouse name: Not on file  . Number of children: 1  . Years of education: Not on file  . Highest education level: Not on file  Occupational History  . Not on file  Tobacco Use  . Smoking status: Former Smoker    Quit date: 03/12/1983    Years since quitting: 36.5  . Smokeless tobacco: Never Used  Vaping Use  . Vaping Use: Never used  Substance and Sexual Activity  . Alcohol use: No    Alcohol/week: 0.0 standard drinks  . Drug use: No  . Sexual activity: Not Currently  Other Topics Concern  . Not on file  Social History Narrative  . Not on file   Social Determinants of Health   Financial Resource Strain:   . Difficulty of Paying Living Expenses:   Food Insecurity:   . Worried About Charity fundraiser in the Last Year:   . Arboriculturist in the Last Year:     Transportation Needs:   . Film/video editor (Medical):   Marland Kitchen Lack of Transportation (Non-Medical):   Physical Activity:   . Days of Exercise per Week:   . Minutes of Exercise per Session:   Stress:   . Feeling of Stress :   Social Connections:   . Frequency of Communication with Friends and Family:   . Frequency of Social Gatherings with Friends and Family:   . Attends Religious Services:   . Active Member of Clubs or Organizations:   . Attends Archivist Meetings:   Marland Kitchen Marital Status:   Intimate Partner Violence:   . Fear of Current or Ex-Partner:   . Emotionally Abused:   Marland Kitchen Physically Abused:   . Sexually Abused:     FAMILY HISTORY:  Family History  Problem Relation Age of Onset  . Anesthesia problems Neg Hx   . Hypotension Neg Hx   . Malignant hyperthermia Neg Hx   . Pseudochol deficiency Neg Hx     CURRENT MEDICATIONS:  Outpatient Encounter Medications as of 09/13/2019  Medication Sig Note  . Amino Acids-Protein Hydrolys (FEEDING SUPPLEMENT, PRO-STAT SUGAR FREE 64,) LIQD Take 30 mLs by mouth daily. (  WOUND HEALING) (0600)   . Biotin 5000 MCG CAPS Take 5,000 mcg by mouth daily. (0800)   . calcium acetate, Phos Binder, (PHOSLYRA) 667 MG/5ML SOLN Take 2,001 mg by mouth 3 (three) times daily with meals. (0800, 1200, & 2000)   . calcium carbonate (TUMS - DOSED IN MG ELEMENTAL CALCIUM) 500 MG chewable tablet Chew 2 tablets by mouth in the morning and at bedtime. (0800 & 2100)   . clopidogrel (PLAVIX) 75 MG tablet Take 1 tablet (75 mg total) by mouth daily. (Patient taking differently: Take 75 mg by mouth daily. (0800))   . dolutegravir (TIVICAY) 50 MG tablet Take 50 mg by mouth daily at 4 PM. (1600)   . fentaNYL (DURAGESIC) 50 MCG/HR Place 1 patch onto the skin every 3 (three) days.   Marland Kitchen lamivudine (EPIVIR) 100 MG tablet Take 50 mg by mouth daily. (0600)   . methadone (DOLOPHINE) 5 MG tablet Take 2.5 mg by mouth daily. (2000)   . mirtazapine (REMERON) 15 MG tablet  Take 15 mg by mouth at bedtime. (2000)   . Multiple Vitamin (MULTIVITAMIN WITH MINERALS) TABS tablet Take 1 tablet by mouth at bedtime. (2000)   . omeprazole (PRILOSEC) 20 MG capsule Take 40 mg by mouth daily at 6 (six) AM. (0600)   . ondansetron (ZOFRAN-ODT) 4 MG disintegrating tablet Take 4 mg by mouth every 8 (eight) hours as needed for nausea or vomiting.   . OXYGEN Inhale 2 L into the lungs as needed.   . promethazine (PHENERGAN) 12.5 MG tablet Take 12.5 mg by mouth Every Tuesday,Thursday,and Saturday with dialysis. May take an additional 12.5 mg dose every 6 hours as needed for nausea   . sertraline (ZOLOFT) 100 MG tablet Take 200 mg by mouth daily. (0900) 08/25/2019: 200MG +25MG =225 MG TOTAL DAILY DOSE  . sertraline (ZOLOFT) 25 MG tablet Take 100 mg by mouth at bedtime. (2000) 08/25/2019: 200MG +25MG =225 MG TOTAL DAILY DOSE  . traZODone (DESYREL) 50 MG tablet Take 50 mg by mouth at bedtime. (2100)   . trolamine salicylate (ASPERCREME) 10 % cream Apply 1 application topically as needed for muscle pain.   . vitamin B-12 (CYANOCOBALAMIN) 100 MCG tablet Take 100 mcg by mouth daily. (0800)   . vitamin C (ASCORBIC ACID) 500 MG tablet Take 500 mg by mouth 2 (two) times daily. (0900 & 2100)   . zidovudine (RETROVIR) 100 MG capsule Take 100 mg by mouth 3 (three) times daily. (0900, 1300, & 2100)   . Zinc 220 (50 Zn) MG CAPS Take 220 mg by mouth daily. (0800)   . acetaminophen (TYLENOL) 650 MG CR tablet Take 650 mg by mouth every 8 (eight) hours as needed for pain. (Patient not taking: Reported on 09/13/2019)   . Lidocaine HCl (ASPERCREME W/LIDOCAINE) 4 % CREA Apply 1 application topically 2 (two) times daily as needed (to bilateral wrists for pain).  (Patient not taking: Reported on 09/13/2019)   . nitroGLYCERIN (NITROSTAT) 0.4 MG SL tablet Place 0.4 mg under the tongue every 5 (five) minutes as needed for chest pain. (Patient not taking: Reported on 09/13/2019)   . Oxycodone HCl 10 MG TABS Take 1 tablet (10  mg total) by mouth every 12 (twelve) hours as needed (severe pain). (Patient not taking: Reported on 09/13/2019)   . [EXPIRED] cyanocobalamin ((VITAMIN B-12)) injection 1,000 mcg     No facility-administered encounter medications on file as of 09/13/2019.    ALLERGIES:  Allergies  Allergen Reactions  . Lactose Intolerance (Gi) Other (See Comments)  On MAR  . Latex Rash and Itching  . Penicillins Rash and Swelling    Has patient had a PCN reaction causing immediate rash, facial/tongue/throat swelling, SOB or lightheadedness with hypotension: No Has patient had a PCN reaction causing severe rash involving mucus membranes or skin necrosis: No Has patient had a PCN reaction that required hospitalization No Has patient had a PCN reaction occurring within the last 10 years: No If all of the above answers are "NO", then may proceed with Cephalosporin use.       PHYSICAL EXAM:  ECOG Performance status: 1  Vitals:   09/13/19 1021  BP: (!) 168/81  Pulse: 77  Resp: 18  Temp: 97.9 F (36.6 C)  SpO2: 99%   Filed Weights   09/13/19 1021  Weight: 95 lb 10.9 oz (43.4 kg)   Physical Exam Constitutional:      Appearance: Normal appearance. She is normal weight.  Cardiovascular:     Rate and Rhythm: Normal rate and regular rhythm.     Heart sounds: Normal heart sounds.  Pulmonary:     Effort: Pulmonary effort is normal.     Breath sounds: Normal breath sounds.  Abdominal:     General: Bowel sounds are normal.     Palpations: Abdomen is soft.  Musculoskeletal:        General: Normal range of motion.  Skin:    General: Skin is warm.  Neurological:     Mental Status: She is alert and oriented to person, place, and time. Mental status is at baseline.  Psychiatric:        Mood and Affect: Mood normal.        Behavior: Behavior normal.        Thought Content: Thought content normal.        Judgment: Judgment normal.      LABORATORY DATA:  I have reviewed the labs as listed.    CBC    Component Value Date/Time   WBC 2.9 (L) 09/06/2019 1007   RBC 2.04 (L) 09/06/2019 1007   HGB 7.0 (L) 09/06/2019 1007   HCT 23.5 (L) 09/06/2019 1007   PLT 128 (L) 09/06/2019 1007   MCV 115.2 (H) 09/06/2019 1007   MCH 34.3 (H) 09/06/2019 1007   MCHC 29.8 (L) 09/06/2019 1007   RDW 14.7 09/06/2019 1007   LYMPHSABS 1.1 09/06/2019 1007   MONOABS 0.3 09/06/2019 1007   EOSABS 0.1 09/06/2019 1007   BASOSABS 0.0 09/06/2019 1007   CMP Latest Ref Rng & Units 09/06/2019 08/28/2019 07/21/2019  Glucose 70 - 99 mg/dL 87 79 67(L)  BUN 8 - 23 mg/dL 30(H) 30(H) 27(H)  Creatinine 0.44 - 1.00 mg/dL 5.22(H) 6.60(H) 5.81(H)  Sodium 135 - 145 mmol/L 139 141 136  Potassium 3.5 - 5.1 mmol/L 4.3 3.7 4.1  Chloride 98 - 111 mmol/L 97(L) 98 96(L)  CO2 22 - 32 mmol/L 32 - 27  Calcium 8.9 - 10.3 mg/dL 10.1 - 9.0  Total Protein 6.5 - 8.1 g/dL 8.3(H) - 6.7  Total Bilirubin 0.3 - 1.2 mg/dL 0.5 - 0.6  Alkaline Phos 38 - 126 U/L 138(H) - 100  AST 15 - 41 U/L 16 - 13(L)  ALT 0 - 44 U/L 9 - 10    All questions were answered to patient's stated satisfaction. Encouraged patient to call with any new concerns or questions before his next visit to the cancer center and we can certain see him sooner, if needed.     ASSESSMENT & PLAN:  Other pancytopenia ([place]) 1.  Pancytopenia: -CBC on 09/20/2018 showed white count 2.6, hemoglobin 9.4 and MCV 100, platelet count 128.  Differential showed 44% neutrophils. -Repeat CBC in our office on 12/08/2018 showed white count 3.4 with 60% neutrophils.  Hemoglobin improved to 10.8 and platelet count was normal at 172. -B12 was normal with elevated methylmalonic acid of 700.  I have given a B12 shot today in the office.  She is taking B12 1 mg tablet daily. -We have done infectious disease panel including CMV, EBV and parvo B19 and hepatitis panel which were negative.  EBV IgG antibody was positive but IgM was negative. -Labs done on 09/06/2019 showed WBC 2.9, hemoglobin 7.0,  platelets 128, ferritin 998, methylmalonic acid 477, B12 1220 -She had a dose increase of Epogen with her dialysis.  She did not want any blood products at this time due to not having a ride home. -I have called [facility] in [facility] she is getting 5200 units 3 times a week.  She will have another dose increase starting 715 2021-day we will recheck her hemoglobin next Tuesday. -She will follow-up in 8 weeks with repeat labs.  2.  HIV: -She is on Epivir 50 milligrams daily, Retrovir 100 mg 3 times a day and Tivicay 50 mg daily. -CD4 count and viral load are done periodically by infectious disease.  3. ESRD: -She undergoes hemodialysis on Mondays, Wednesdays and Fridays. -She missed her last dialysis appointment due to her not feeling well.  4.  Chronic pain: -She is on fentanyl 50 mcg and oxycodone 10 mg twice daily as needed.  5.  Vitamin D deficiency: -Labs done on 07/12/2019 showed vitamin D level 9.23 -We will start her on 50,000 units weekly. -09/06/2019 showed her vitamin D level 10.65 -She reports she does not always take this.  6.  New acute onset abdominal pain/hernia: -Patient reports she has new onset abdominal pain in a small round mass on her abdomen that is new. -She has not felt good over the past few days and has severe abdominal pain.  It is very tender to the touch. -She reports she has not reported this to anyone. -We will send her to the ER to evaluate hernia/bowel strangulation.     Orders placed this encounter:  Orders Placed This Encounter  Procedures  . Lactate dehydrogenase  . CBC with Differential/Platelet  . Comprehensive metabolic panel  . Vitamin B12  . Methylmalonic acid, serum  . VITAMIN D 25 Hydroxy (Vit-D Deficiency, Fractures)  . Ferritin  . Iron and TIBC      Francene Finders, FNP-C [facility] [phone]

## 2019-09-13 NOTE — Assessment & Plan Note (Signed)
1.  Pancytopenia: -CBC on 09/20/2018 showed white count 2.6, hemoglobin 9.4 and MCV 100, platelet count 128.  Differential showed 44% neutrophils. -Repeat CBC in our office on 12/08/2018 showed white count 3.4 with 60% neutrophils.  Hemoglobin improved to 10.8 and platelet count was normal at 172. -B12 was normal with elevated methylmalonic acid of 700.  I have given a B12 shot today in the office.  She is taking B12 1 mg tablet daily. -We have done infectious disease panel including CMV, EBV and parvo B19 and hepatitis panel which were negative.  EBV IgG antibody was positive but IgM was negative. -Labs done on 09/06/2019 showed WBC 2.9, hemoglobin 7.0, platelets 128, ferritin 998, methylmalonic acid 477, B12 1220 -She had a dose increase of Epogen with her dialysis.  She did not want any blood products at this time due to not having a ride home. -I have called [facility] in [place] she is getting 5200 units 3 times a week.  She will have another dose increase starting 715 2021-day we will recheck her hemoglobin next Tuesday. -She will follow-up in 8 weeks with repeat labs.  2.  HIV: -She is on Epivir 50 milligrams daily, Retrovir 100 mg 3 times a day and Tivicay 50 mg daily. -CD4 count and viral load are done periodically by infectious disease.  3. ESRD: -She undergoes hemodialysis on Mondays, Wednesdays and Fridays. -She missed her last dialysis appointment due to her not feeling well.  4.  Chronic pain: -She is on fentanyl 50 mcg and oxycodone 10 mg twice daily as needed.  5.  Vitamin D deficiency: -Labs done on 07/12/2019 showed vitamin D level 9.23 -We will start her on 50,000 units weekly. -09/06/2019 showed her vitamin D level 10.65 -She reports she does not always take this.  6.  New acute onset abdominal pain/hernia: -Patient reports she has new onset abdominal pain in a small round mass on her abdomen that is new. -She has not felt good over the past few days and has severe  abdominal pain.  It is very tender to the touch. -She reports she has not reported this to anyone. -We will send her to the ER to evaluate hernia/bowel strangulation.

## 2019-09-13 NOTE — Patient Instructions (Signed)
[facility] at [facility] Discharge Instructions  Follow up in 8 weeks with labs    Thank you for choosing [facility] at [facility] to provide your oncology and hematology care.  To afford each patient quality time with our provider, please arrive at least 15 minutes before your scheduled appointment time.   If you have a lab appointment with the Cancer Center please come in thru the Main Entrance and check in at the main information desk.  You need to re-schedule your appointment should you arrive 10 or more minutes late.  We strive to give you quality time with our providers, and arriving late affects you and other patients whose appointments are after yours.  Also, if you no show three or more times for appointments you may be dismissed from the clinic at the providers discretion.     Again, thank you for choosing [facility].  Our hope is that these requests will decrease the amount of time that you wait before being seen by our physicians.       _____________________________________________________________  Should you have questions after your visit to  Cancer Center, please contact our office at [phone] between the hours of 8:00 a.m. and 4:30 p.m.  Voicemails left after 4:00 p.m. will not be returned until the following business day.  For prescription refill requests, have your pharmacy contact our office and allow 72 hours.    Due to Covid, you will need to wear a mask upon entering the hospital. If you do not have a mask, a mask will be given to you at the Main Entrance upon arrival. For doctor visits, patients may have 1 support person with them. For treatment visits, patients can not have anyone with them due to social distancing guidelines and our immunocompromised population.      

## 2019-09-14 ENCOUNTER — Other Ambulatory Visit (HOSPITAL_COMMUNITY): Payer: Self-pay | Admitting: Nurse Practitioner

## 2019-09-14 DIAGNOSIS — Z992 Dependence on renal dialysis: Secondary | ICD-10-CM | POA: Diagnosis not present

## 2019-09-14 DIAGNOSIS — D61818 Other pancytopenia: Secondary | ICD-10-CM

## 2019-09-14 DIAGNOSIS — D472 Monoclonal gammopathy: Secondary | ICD-10-CM | POA: Diagnosis not present

## 2019-09-14 DIAGNOSIS — D631 Anemia in chronic kidney disease: Secondary | ICD-10-CM | POA: Diagnosis not present

## 2019-09-14 DIAGNOSIS — D509 Iron deficiency anemia, unspecified: Secondary | ICD-10-CM | POA: Diagnosis not present

## 2019-09-14 DIAGNOSIS — N186 End stage renal disease: Secondary | ICD-10-CM | POA: Diagnosis not present

## 2019-09-14 DIAGNOSIS — N2581 Secondary hyperparathyroidism of renal origin: Secondary | ICD-10-CM | POA: Diagnosis not present

## 2019-09-15 ENCOUNTER — Other Ambulatory Visit (HOSPITAL_COMMUNITY): Payer: Self-pay

## 2019-09-15 ENCOUNTER — Other Ambulatory Visit: Payer: Self-pay

## 2019-09-15 ENCOUNTER — Inpatient Hospital Stay (HOSPITAL_COMMUNITY): Payer: Medicare Other

## 2019-09-15 ENCOUNTER — Encounter (HOSPITAL_COMMUNITY): Payer: Self-pay

## 2019-09-15 DIAGNOSIS — R109 Unspecified abdominal pain: Secondary | ICD-10-CM | POA: Diagnosis not present

## 2019-09-15 DIAGNOSIS — G8929 Other chronic pain: Secondary | ICD-10-CM | POA: Diagnosis not present

## 2019-09-15 DIAGNOSIS — N186 End stage renal disease: Secondary | ICD-10-CM | POA: Diagnosis not present

## 2019-09-15 DIAGNOSIS — D61818 Other pancytopenia: Secondary | ICD-10-CM

## 2019-09-15 DIAGNOSIS — Z21 Asymptomatic human immunodeficiency virus [HIV] infection status: Secondary | ICD-10-CM | POA: Diagnosis not present

## 2019-09-15 DIAGNOSIS — K469 Unspecified abdominal hernia without obstruction or gangrene: Secondary | ICD-10-CM | POA: Diagnosis not present

## 2019-09-15 LAB — CBC WITH DIFFERENTIAL/PLATELET
Abs Immature Granulocytes: 0.01 10*3/uL (ref 0.00–0.07)
Basophils Absolute: 0 10*3/uL (ref 0.0–0.1)
Basophils Relative: 0 %
Eosinophils Absolute: 0.1 10*3/uL (ref 0.0–0.5)
Eosinophils Relative: 2 %
HCT: 20.9 % — ABNORMAL LOW (ref 36.0–46.0)
Hemoglobin: 6.1 g/dL — CL (ref 12.0–15.0)
Immature Granulocytes: 0 %
Lymphocytes Relative: 39 %
Lymphs Abs: 1 10*3/uL (ref 0.7–4.0)
MCH: 34.3 pg — ABNORMAL HIGH (ref 26.0–34.0)
MCHC: 29.2 g/dL — ABNORMAL LOW (ref 30.0–36.0)
MCV: 117.4 fL — ABNORMAL HIGH (ref 80.0–100.0)
Monocytes Absolute: 0.2 10*3/uL (ref 0.1–1.0)
Monocytes Relative: 9 %
Neutro Abs: 1.3 10*3/uL — ABNORMAL LOW (ref 1.7–7.7)
Neutrophils Relative %: 50 %
Platelets: 146 10*3/uL — ABNORMAL LOW (ref 150–400)
RBC: 1.78 MIL/uL — ABNORMAL LOW (ref 3.87–5.11)
RDW: 16.6 % — ABNORMAL HIGH (ref 11.5–15.5)
WBC: 2.6 10*3/uL — ABNORMAL LOW (ref 4.0–10.5)
nRBC: 0 % (ref 0.0–0.2)

## 2019-09-15 LAB — COMPREHENSIVE METABOLIC PANEL
ALT: 9 U/L (ref 0–44)
AST: 17 U/L (ref 15–41)
Albumin: 3.5 g/dL (ref 3.5–5.0)
Alkaline Phosphatase: 133 U/L — ABNORMAL HIGH (ref 38–126)
Anion gap: 10 (ref 5–15)
BUN: 15 mg/dL (ref 8–23)
CO2: 31 mmol/L (ref 22–32)
Calcium: 9.6 mg/dL (ref 8.9–10.3)
Chloride: 100 mmol/L (ref 98–111)
Creatinine, Ser: 4.17 mg/dL — ABNORMAL HIGH (ref 0.44–1.00)
GFR calc Af Amer: 12 mL/min — ABNORMAL LOW (ref >60)
GFR calc non Af Amer: 10 mL/min — ABNORMAL LOW (ref >60)
Glucose, Bld: 92 mg/dL (ref 70–99)
Potassium: 3.8 mmol/L (ref 3.5–5.1)
Sodium: 141 mmol/L (ref 135–145)
Total Bilirubin: 0.6 mg/dL (ref 0.3–1.2)
Total Protein: 7.7 g/dL (ref 6.5–8.1)

## 2019-09-15 LAB — PREPARE RBC (CROSSMATCH)

## 2019-09-15 MED ORDER — ACETAMINOPHEN 325 MG PO TABS
650.0000 mg | ORAL_TABLET | Freq: Once | ORAL | Status: AC
  Administered 2019-09-15: 650 mg via ORAL
  Filled 2019-09-15: qty 2

## 2019-09-15 MED ORDER — SODIUM CHLORIDE 0.9% FLUSH: 10.0000 mL | INTRAVENOUS | Status: DC | PRN

## 2019-09-15 MED ORDER — SODIUM CHLORIDE 0.9% IV SOLUTION
250.0000 mL | Freq: Once | INTRAVENOUS | Status: AC
  Administered 2019-09-15: 250 mL via INTRAVENOUS

## 2019-09-15 MED ORDER — HEPARIN SOD (PORK) LOCK FLUSH 100 UNIT/ML IV SOLN: 500.0000 [IU] | Freq: Every day | INTRAVENOUS | Status: DC | PRN

## 2019-09-15 MED ORDER — DIPHENHYDRAMINE HCL 25 MG PO CAPS
25.0000 mg | ORAL_CAPSULE | Freq: Once | ORAL | Status: AC
  Administered 2019-09-15: 25 mg via ORAL
  Filled 2019-09-15: qty 1

## 2019-09-15 NOTE — Progress Notes (Signed)
Patient received 1 unit PRBCs per order. Patient tolerated well and without complaints. VSS. Patient departed clinic via wheelchair in satisfactory condition.

## 2019-09-15 NOTE — Progress Notes (Signed)
CRITICAL VALUE ALERT  Critical Value:  hgb 6.1  Date & Time Notied:  09/15/2019 at [place]  Provider Notified: Cristela Felt, NP  Orders Received/Actions taken: transfuse 1 units PRBC as scheduled

## 2019-09-16 DIAGNOSIS — N186 End stage renal disease: Secondary | ICD-10-CM | POA: Diagnosis not present

## 2019-09-16 DIAGNOSIS — D472 Monoclonal gammopathy: Secondary | ICD-10-CM | POA: Diagnosis not present

## 2019-09-16 DIAGNOSIS — N2581 Secondary hyperparathyroidism of renal origin: Secondary | ICD-10-CM | POA: Diagnosis not present

## 2019-09-16 DIAGNOSIS — Z992 Dependence on renal dialysis: Secondary | ICD-10-CM | POA: Diagnosis not present

## 2019-09-16 DIAGNOSIS — D631 Anemia in chronic kidney disease: Secondary | ICD-10-CM | POA: Diagnosis not present

## 2019-09-16 DIAGNOSIS — D509 Iron deficiency anemia, unspecified: Secondary | ICD-10-CM | POA: Diagnosis not present

## 2019-09-16 LAB — TYPE AND SCREEN
ABO/RH(D): O POS
Antibody Screen: NEGATIVE
Unit division: 0

## 2019-09-16 LAB — BPAM RBC
Blood Product Expiration Date: 202107272359
ISSUE DATE / TIME: 202107161004
Unit Type and Rh: 9500

## 2019-09-19 ENCOUNTER — Ambulatory Visit (HOSPITAL_COMMUNITY): Payer: Medicare Other | Admitting: Nurse Practitioner

## 2019-09-19 DIAGNOSIS — Z992 Dependence on renal dialysis: Secondary | ICD-10-CM | POA: Diagnosis not present

## 2019-09-19 DIAGNOSIS — D509 Iron deficiency anemia, unspecified: Secondary | ICD-10-CM | POA: Diagnosis not present

## 2019-09-19 DIAGNOSIS — N2581 Secondary hyperparathyroidism of renal origin: Secondary | ICD-10-CM | POA: Diagnosis not present

## 2019-09-19 DIAGNOSIS — D631 Anemia in chronic kidney disease: Secondary | ICD-10-CM | POA: Diagnosis not present

## 2019-09-19 DIAGNOSIS — D472 Monoclonal gammopathy: Secondary | ICD-10-CM | POA: Diagnosis not present

## 2019-09-19 DIAGNOSIS — N186 End stage renal disease: Secondary | ICD-10-CM | POA: Diagnosis not present

## 2019-09-20 ENCOUNTER — Telehealth: Payer: Self-pay | Admitting: Gastroenterology

## 2019-09-20 DIAGNOSIS — F331 Major depressive disorder, recurrent, moderate: Secondary | ICD-10-CM | POA: Diagnosis not present

## 2019-09-20 DIAGNOSIS — I10 Essential (primary) hypertension: Secondary | ICD-10-CM | POA: Diagnosis not present

## 2019-09-20 DIAGNOSIS — L89150 Pressure ulcer of sacral region, unstageable: Secondary | ICD-10-CM | POA: Diagnosis not present

## 2019-09-20 DIAGNOSIS — R4189 Other symptoms and signs involving cognitive functions and awareness: Secondary | ICD-10-CM | POA: Diagnosis not present

## 2019-09-20 DIAGNOSIS — G4701 Insomnia due to medical condition: Secondary | ICD-10-CM | POA: Diagnosis not present

## 2019-09-20 DIAGNOSIS — B20 Human immunodeficiency virus [HIV] disease: Secondary | ICD-10-CM | POA: Diagnosis not present

## 2019-09-20 DIAGNOSIS — F330 Major depressive disorder, recurrent, mild: Secondary | ICD-10-CM | POA: Diagnosis not present

## 2019-09-20 DIAGNOSIS — N186 End stage renal disease: Secondary | ICD-10-CM | POA: Diagnosis not present

## 2019-09-20 DIAGNOSIS — F5101 Primary insomnia: Secondary | ICD-10-CM | POA: Diagnosis not present

## 2019-09-20 DIAGNOSIS — F064 Anxiety disorder due to known physiological condition: Secondary | ICD-10-CM | POA: Diagnosis not present

## 2019-09-20 NOTE — Telephone Encounter (Signed)
Pt needs hospital follow up with [facility].

## 2019-09-21 DIAGNOSIS — N186 End stage renal disease: Secondary | ICD-10-CM | POA: Diagnosis not present

## 2019-09-21 DIAGNOSIS — N2581 Secondary hyperparathyroidism of renal origin: Secondary | ICD-10-CM | POA: Diagnosis not present

## 2019-09-21 DIAGNOSIS — D509 Iron deficiency anemia, unspecified: Secondary | ICD-10-CM | POA: Diagnosis not present

## 2019-09-21 DIAGNOSIS — D631 Anemia in chronic kidney disease: Secondary | ICD-10-CM | POA: Diagnosis not present

## 2019-09-21 DIAGNOSIS — Z992 Dependence on renal dialysis: Secondary | ICD-10-CM | POA: Diagnosis not present

## 2019-09-21 DIAGNOSIS — D472 Monoclonal gammopathy: Secondary | ICD-10-CM | POA: Diagnosis not present

## 2019-09-22 ENCOUNTER — Encounter: Payer: Self-pay | Admitting: Nurse Practitioner

## 2019-09-22 NOTE — Telephone Encounter (Signed)
Patient scheduled.

## 2019-09-23 DIAGNOSIS — D509 Iron deficiency anemia, unspecified: Secondary | ICD-10-CM | POA: Diagnosis not present

## 2019-09-23 DIAGNOSIS — D631 Anemia in chronic kidney disease: Secondary | ICD-10-CM | POA: Diagnosis not present

## 2019-09-23 DIAGNOSIS — D472 Monoclonal gammopathy: Secondary | ICD-10-CM | POA: Diagnosis not present

## 2019-09-23 DIAGNOSIS — N2581 Secondary hyperparathyroidism of renal origin: Secondary | ICD-10-CM | POA: Diagnosis not present

## 2019-09-23 DIAGNOSIS — N186 End stage renal disease: Secondary | ICD-10-CM | POA: Diagnosis not present

## 2019-09-23 DIAGNOSIS — Z992 Dependence on renal dialysis: Secondary | ICD-10-CM | POA: Diagnosis not present

## 2019-09-25 DIAGNOSIS — G5603 Carpal tunnel syndrome, bilateral upper limbs: Secondary | ICD-10-CM | POA: Diagnosis not present

## 2019-09-26 DIAGNOSIS — N2581 Secondary hyperparathyroidism of renal origin: Secondary | ICD-10-CM | POA: Diagnosis not present

## 2019-09-26 DIAGNOSIS — D472 Monoclonal gammopathy: Secondary | ICD-10-CM | POA: Diagnosis not present

## 2019-09-26 DIAGNOSIS — D631 Anemia in chronic kidney disease: Secondary | ICD-10-CM | POA: Diagnosis not present

## 2019-09-26 DIAGNOSIS — Z992 Dependence on renal dialysis: Secondary | ICD-10-CM | POA: Diagnosis not present

## 2019-09-26 DIAGNOSIS — N186 End stage renal disease: Secondary | ICD-10-CM | POA: Diagnosis not present

## 2019-09-26 DIAGNOSIS — D509 Iron deficiency anemia, unspecified: Secondary | ICD-10-CM | POA: Diagnosis not present

## 2019-09-28 DIAGNOSIS — D509 Iron deficiency anemia, unspecified: Secondary | ICD-10-CM | POA: Diagnosis not present

## 2019-09-28 DIAGNOSIS — N2581 Secondary hyperparathyroidism of renal origin: Secondary | ICD-10-CM | POA: Diagnosis not present

## 2019-09-28 DIAGNOSIS — N186 End stage renal disease: Secondary | ICD-10-CM | POA: Diagnosis not present

## 2019-09-28 DIAGNOSIS — D472 Monoclonal gammopathy: Secondary | ICD-10-CM | POA: Diagnosis not present

## 2019-09-28 DIAGNOSIS — Z992 Dependence on renal dialysis: Secondary | ICD-10-CM | POA: Diagnosis not present

## 2019-09-28 DIAGNOSIS — D631 Anemia in chronic kidney disease: Secondary | ICD-10-CM | POA: Diagnosis not present

## 2019-09-29 ENCOUNTER — Other Ambulatory Visit: Payer: Self-pay

## 2019-09-29 ENCOUNTER — Encounter (HOSPITAL_COMMUNITY): Payer: Self-pay | Admitting: *Deleted

## 2019-09-29 ENCOUNTER — Inpatient Hospital Stay (HOSPITAL_COMMUNITY): Payer: Medicare Other

## 2019-09-29 ENCOUNTER — Emergency Department (HOSPITAL_COMMUNITY)
Admission: EM | Admit: 2019-09-29 | Discharge: 2019-09-29 | Disposition: A | Discharge status: Home / Self Care | Payer: Medicare Other | Attending: Physician Assistant | Admitting: Physician Assistant

## 2019-09-29 DIAGNOSIS — K921 Melena: Secondary | ICD-10-CM | POA: Insufficient documentation

## 2019-09-29 DIAGNOSIS — I132 Hypertensive heart and chronic kidney disease with heart failure and with stage 5 chronic kidney disease, or end stage renal disease: Secondary | ICD-10-CM | POA: Insufficient documentation

## 2019-09-29 DIAGNOSIS — N186 End stage renal disease: Secondary | ICD-10-CM | POA: Insufficient documentation

## 2019-09-29 DIAGNOSIS — Z21 Asymptomatic human immunodeficiency virus [HIV] infection status: Secondary | ICD-10-CM | POA: Insufficient documentation

## 2019-09-29 DIAGNOSIS — Z743 Need for continuous supervision: Secondary | ICD-10-CM | POA: Diagnosis not present

## 2019-09-29 DIAGNOSIS — I509 Heart failure, unspecified: Secondary | ICD-10-CM | POA: Diagnosis not present

## 2019-09-29 DIAGNOSIS — Z87891 Personal history of nicotine dependence: Secondary | ICD-10-CM | POA: Diagnosis not present

## 2019-09-29 DIAGNOSIS — R5381 Other malaise: Secondary | ICD-10-CM | POA: Diagnosis not present

## 2019-09-29 DIAGNOSIS — R531 Weakness: Secondary | ICD-10-CM | POA: Diagnosis not present

## 2019-09-29 DIAGNOSIS — R7889 Finding of other specified substances, not normally found in blood: Secondary | ICD-10-CM | POA: Diagnosis not present

## 2019-09-29 DIAGNOSIS — Z79899 Other long term (current) drug therapy: Secondary | ICD-10-CM | POA: Insufficient documentation

## 2019-09-29 DIAGNOSIS — D638 Anemia in other chronic diseases classified elsewhere: Secondary | ICD-10-CM | POA: Diagnosis not present

## 2019-09-29 DIAGNOSIS — Z7401 Bed confinement status: Secondary | ICD-10-CM | POA: Diagnosis not present

## 2019-09-29 DIAGNOSIS — Z992 Dependence on renal dialysis: Secondary | ICD-10-CM | POA: Diagnosis not present

## 2019-09-29 DIAGNOSIS — R5383 Other fatigue: Secondary | ICD-10-CM | POA: Diagnosis not present

## 2019-09-29 DIAGNOSIS — R279 Unspecified lack of coordination: Secondary | ICD-10-CM | POA: Diagnosis not present

## 2019-09-29 DIAGNOSIS — Z8673 Personal history of transient ischemic attack (TIA), and cerebral infarction without residual deficits: Secondary | ICD-10-CM | POA: Insufficient documentation

## 2019-09-29 DIAGNOSIS — I10 Essential (primary) hypertension: Secondary | ICD-10-CM | POA: Diagnosis not present

## 2019-09-29 LAB — CBC WITH DIFFERENTIAL/PLATELET
Abs Immature Granulocytes: 0.01 10*3/uL (ref 0.00–0.07)
Basophils Absolute: 0 10*3/uL (ref 0.0–0.1)
Basophils Relative: 0 %
Eosinophils Absolute: 0.1 10*3/uL (ref 0.0–0.5)
Eosinophils Relative: 3 %
HCT: 25.8 % — ABNORMAL LOW (ref 36.0–46.0)
Hemoglobin: 7.6 g/dL — ABNORMAL LOW (ref 12.0–15.0)
Immature Granulocytes: 0 %
Lymphocytes Relative: 34 %
Lymphs Abs: 1 10*3/uL (ref 0.7–4.0)
MCH: 33.9 pg (ref 26.0–34.0)
MCHC: 29.5 g/dL — ABNORMAL LOW (ref 30.0–36.0)
MCV: 115.2 fL — ABNORMAL HIGH (ref 80.0–100.0)
Monocytes Absolute: 0.2 10*3/uL (ref 0.1–1.0)
Monocytes Relative: 8 %
Neutro Abs: 1.6 10*3/uL — ABNORMAL LOW (ref 1.7–7.7)
Neutrophils Relative %: 55 %
Platelets: 128 10*3/uL — ABNORMAL LOW (ref 150–400)
RBC: 2.24 MIL/uL — ABNORMAL LOW (ref 3.87–5.11)
RDW: 19.5 % — ABNORMAL HIGH (ref 11.5–15.5)
WBC: 3 10*3/uL — ABNORMAL LOW (ref 4.0–10.5)
nRBC: 0 % (ref 0.0–0.2)

## 2019-09-29 LAB — TYPE AND SCREEN
ABO/RH(D): O POS
Antibody Screen: NEGATIVE

## 2019-09-29 LAB — POC OCCULT BLOOD, ED: Fecal Occult Bld: NEGATIVE

## 2019-09-29 LAB — COMPREHENSIVE METABOLIC PANEL
ALT: 9 U/L (ref 0–44)
AST: 18 U/L (ref 15–41)
Albumin: 3.6 g/dL (ref 3.5–5.0)
Alkaline Phosphatase: 160 U/L — ABNORMAL HIGH (ref 38–126)
Anion gap: 10 (ref 5–15)
BUN: 20 mg/dL (ref 8–23)
CO2: 28 mmol/L (ref 22–32)
Calcium: 9.6 mg/dL (ref 8.9–10.3)
Chloride: 101 mmol/L (ref 98–111)
Creatinine, Ser: 5.17 mg/dL — ABNORMAL HIGH (ref 0.44–1.00)
GFR calc Af Amer: 9 mL/min — ABNORMAL LOW (ref >60)
GFR calc non Af Amer: 8 mL/min — ABNORMAL LOW (ref >60)
Glucose, Bld: 135 mg/dL — ABNORMAL HIGH (ref 70–99)
Potassium: 3.9 mmol/L (ref 3.5–5.1)
Sodium: 139 mmol/L (ref 135–145)
Total Bilirubin: 0.4 mg/dL (ref 0.3–1.2)
Total Protein: 8.1 g/dL (ref 6.5–8.1)

## 2019-09-29 NOTE — ED Triage Notes (Signed)
Patient here to be evaluated due to abnormal labs as requested by her Cancer center to her home at [place].  Patient has reported low hemoglobin.  Patient is also a dialysis patient, who had been fully dialyzed one day ago.

## 2019-09-29 NOTE — ED Provider Notes (Signed)
[facility] EMERGENCY DEPARTMENT Provider Note   CSN: [id number] Arrival date & time: 09/29/19  1338     History Chief Complaint  Patient presents with  . Abnormal Lab    [name] is a 69 y.o. female with a past medical history of ESRD on dialysis Tu, Th, Sat, HIV on antiretrovirals, prior ICH, prior TIA, hypertension presenting to the ED with a chief complaint of fatigue and abnormal lab work.  She was seen and evaluated in heme-onc clinic on 09/13/2019 for follow-up for pancytopenia.  She had lab work checked and found to have a hemoglobin of 6.1.  She was transfused 1 unit of blood on 09/19/2019.  She has been completing her dialysis sessions including yesterday without difficulty.  She does report dark-colored stools recently but denies any past several days.  She denies any abdominal pain, chest pain, vomiting, injuries or falls.  She usually ambulates with a walker or uses a wheelchair.  She was dialyzed yesterday and was told that she had low hemoglobin and needed to be seen in the ER.  Has not had a colonoscopy in the past.  She believes she may have had an endoscopy several years ago.  No history of GI bleed.  HPI     Past Medical History:  Diagnosis Date  . Anemia   . Anxiety   . Atrial flutter ([place]) 02/22/2015  . C. difficile colitis 10/10/11  . Chronic diarrhea   . Depression   . ESRD on hemodialysis ([place])   . GERD (gastroesophageal reflux disease)   . HIV (human immunodeficiency virus infection) ([place])   . Hypertension   . Insomnia   . Intracranial hemorrhage ([place])   . Muscle weakness (generalized)   . Pulmonary HTN ([place])   . Tachycardia   . TIA (transient ischemic attack)   . Traumatic hematoma of right upper arm 02/22/2015    Patient Active Problem List   Diagnosis Date Noted  . Common bile duct dilation   . Lobar pneumonia ([place]) 07/20/2019  . Acute respiratory failure with hypoxia ([place]) 07/20/2019  . Colitis 07/19/2019  . Dilation of common bile duct  07/19/2019  . Ventral hernia without obstruction or gangrene   . Hypocalcemia 03/07/2019  . Other pancytopenia ([place]) 12/08/2018  . Anemia of chronic disease 04/17/2016  . Hepatitis B immune 01/28/2016  . Atrial flutter ([place]) 02/22/2015  . Hyperkalemia 02/20/2015  . Venous stenosis of right upper extremity 04/06/2014  . ESRD on dialysis ([place]) 04/06/2014  . Essential hypertension   . Nausea with vomiting 12/15/2012  . CHF, chronic ([place]) 11/25/2012  . Atrial fibrillation ([place]) 05/02/2012  . Intracranial bleed ([place]) 12/28/2011  . Angioedema of lips 09/21/2011  . Cellulitis of breast 03/09/2011  . Erosive esophagitis 12/15/2010  . Mallory - Weiss tear 12/15/2010  . UGI bleed 12/13/2010  . DIARRHEA 04/12/2009  . Human immunodeficiency virus (HIV) disease ([place]) 11/15/2008  . PANCREATITIS, HX OF 11/15/2008  . TOBACCO USE, QUIT 11/15/2008  . PAIN IN JOINT, UPPER ARM 08/07/2008  . ABDOMINAL WALL HERNIA 04/26/2007  . BRONCHITIS, ACUTE 11/16/2006  . HYPOKALEMIA, HX OF 07/13/2006  . DEPRESSION 03/23/2006  . Essential hypertension, malignant 03/23/2006  . GERD 03/23/2006  . PANCREATITIS 03/23/2006  . MASS, RIGHT AXILLA 03/23/2006  . HEADACHE 03/23/2006  . SYMPTOM, ENLARGEMENT, LYMPH NODES 03/23/2006  . SHINGLES, HX OF 03/23/2006  . HYSTERECTOMY, TOTAL, HX OF 03/23/2006    Past Surgical History:  Procedure Laterality Date  . A/V FISTULAGRAM N/A 04/17/2016  Procedure: A/V Fistulagram - Right Upper;  Surgeon: Waynetta Sandy, MD;  Location: [place] [facility];  Service: Cardiovascular;  Laterality: N/A;  . A/V FISTULAGRAM Right 08/28/2019   Procedure: A/V FISTULAGRAM;  Surgeon: Waynetta Sandy, MD;  Location: [place] [facility];  Service: Cardiovascular;  Laterality: Right;  . BIOPSY THYROID    . Dialysis Shunts     previous one removed from left arm and now present one in right arm  . DIALYSIS/PERMA CATHETER INSERTION Right 04/17/2016   Procedure: dialysis Catheter  Insertion central veinous;  Surgeon: Waynetta Sandy, MD;  Location: [place] [facility];  Service: Cardiovascular;  Laterality: Right;  . ESOPHAGOGASTRODUODENOSCOPY  12/15/2010   Rourk: erosive reflux esophagitis, MW tear, hiatal hernia (small), gastritis with no h.pylori, possibly nsaid related  . EXCISION OF BREAST BIOPSY Right 05/04/2012   Procedure: EXCISION OF BREAST BIOPSY;  Surgeon: Donato Heinz, MD;  Location: [facility];  Service: General;  Laterality: Right;  Right Excisional Breast Biopsy  . FISTULOGRAM Right 03/26/2014   Procedure: Right Arm Fistulogram with Venoplasty Right Subclavian Vein and Inominate Vein. Debridement Fistula Ulcer;  Surgeon: Conrad [place], MD;  Location: [place];  Service: Vascular;  Laterality: Right;  . FISTULOGRAM Right 03/29/2017   Procedure: FISTULOGRAM COMPLEX RIGHT ARM with Balloon angioplasty;  Surgeon: Waynetta Sandy, MD;  Location: [place];  Service: Vascular;  Laterality: Right;  . IR GENERIC HISTORICAL  05/19/2016   IR REMOVAL TUN CV CATH W/O FL 05/19/2016 Markus Daft, MD [facility]-INTERV RAD  . PERIPHERAL VASCULAR BALLOON ANGIOPLASTY Right 04/17/2016   Procedure: Peripheral Vascular Balloon Angioplasty;  Surgeon: Waynetta Sandy, MD;  Location: [place] [facility];  Service: Cardiovascular;  Laterality: Right;  Central segment AV Fistula  . PERIPHERAL VASCULAR BALLOON ANGIOPLASTY Right 03/14/2018   Procedure: PERIPHERAL VASCULAR BALLOON ANGIOPLASTY;  Surgeon: Waynetta Sandy, MD;  Location: [place] [facility];  Service: Cardiovascular;  Laterality: Right;  subclavian vein  . PERIPHERAL VASCULAR BALLOON ANGIOPLASTY Right 08/28/2019   Procedure: PERIPHERAL VASCULAR BALLOON ANGIOPLASTY;  Surgeon: Waynetta Sandy, MD;  Location: [place] [facility];  Service: Cardiovascular;  Laterality: Right;  upper arm  . PERIPHERAL VASCULAR INTERVENTION Right 03/14/2018   Procedure: PERIPHERAL VASCULAR INTERVENTION;  Surgeon: Waynetta Sandy, MD;  Location: [place] [facility];  Service: Cardiovascular;  Laterality: Right;  subclavian vein  . SHUNTOGRAM Right 10/19/2013   Procedure: FISTULOGRAM;  Surgeon: Conrad Carlin, MD;  Location: [facility];  Service: Cardiovascular;  Laterality: Right;  . TONSILLECTOMY    . VISCERAL VENOGRAPHY Right 03/14/2018   Procedure: CENTRAL VENOGRAPHY;  Surgeon: Waynetta Sandy, MD;  Location: [place] [facility];  Service: Cardiovascular;  Laterality: Right;  upper ext fistula     OB History    Gravida  1   Para  1   Term  1   Preterm      AB      Living  1     SAB      TAB      Ectopic      Multiple      Live Births              Family History  Problem Relation Age of Onset  . Anesthesia problems Neg Hx   . Hypotension Neg Hx   . Malignant hyperthermia Neg Hx   . Pseudochol deficiency Neg Hx     Social History   Tobacco Use  .  Smoking status: Former Smoker    Quit date: 03/12/1983    Years since quitting: 36.5  . Smokeless tobacco: Never Used  Vaping Use  . Vaping Use: Never used  Substance Use Topics  . Alcohol use: No    Alcohol/week: 0.0 standard drinks  . Drug use: No    Home Medications Prior to Admission medications   Medication Sig Start Date End Date Taking? Authorizing Provider  Amino Acids-Protein Hydrolys (FEEDING SUPPLEMENT, PRO-STAT SUGAR FREE 64,) LIQD Take 30 mLs by mouth daily. (WOUND HEALING) (0600)   Yes [provider]  Biotin 5000 MCG CAPS Take 5,000 mcg by mouth daily. (0800)   Yes [provider]  calcium acetate, Phos Binder, (PHOSLYRA) 667 MG/5ML SOLN Take 2,001 mg by mouth 3 (three) times daily with meals. (0800, 1200, & 2000)   Yes [provider]  calcium carbonate (TUMS - DOSED IN MG ELEMENTAL CALCIUM) 500 MG chewable tablet Chew 2 tablets by mouth in the morning and at bedtime. (0800 & 2100)   Yes [provider]  clopidogrel (PLAVIX) 75 MG tablet Take 1 tablet (75 mg total)  by mouth daily. Patient taking differently: Take 75 mg by mouth daily. (0800) 03/29/17  Yes Waynetta Sandy, MD  dolutegravir (TIVICAY) 50 MG tablet Take 50 mg by mouth daily at 4 PM. (1600)   Yes [provider]  escitalopram (LEXAPRO) 10 MG tablet Take 10 mg by mouth daily.   Yes [provider]  fentaNYL (DURAGESIC) 50 MCG/HR Place 1 patch onto the skin every 3 (three) days.   Yes [provider]  lamivudine (EPIVIR) 100 MG tablet Take 50 mg by mouth daily. (0600)   Yes [provider]  lanolin/mineral oil (KERI/THERA-DERM) LOTN Apply 1 application topically once a week.   Yes [provider]  Lidocaine HCl (ASPERCREME W/LIDOCAINE) 4 % CREA Apply 1 application topically 2 (two) times daily as needed (to bilateral wrists for pain).    Yes [provider]  methadone (DOLOPHINE) 5 MG tablet Take 2.5 mg by mouth daily. (2000) 08/02/19  Yes [provider]  mirtazapine (REMERON) 15 MG tablet Take 15 mg by mouth at bedtime. (2000)   Yes [provider]  Multiple Vitamin (MULTIVITAMIN WITH MINERALS) TABS tablet Take 1 tablet by mouth at bedtime. (2000)   Yes [provider]  nitroGLYCERIN (NITROSTAT) 0.4 MG SL tablet Place 0.4 mg under the tongue every 5 (five) minutes as needed for chest pain.    Yes [provider]  omeprazole (PRILOSEC) 20 MG capsule Take 40 mg by mouth daily at 6 (six) AM. (0600)   Yes [provider]  Oxycodone HCl 10 MG TABS Take 1 tablet (10 mg total) by mouth every 12 (twelve) hours as needed (severe pain). 07/22/19  Yes Tat, Shanon Brow, MD  OXYGEN Inhale 2 L into the lungs as needed.   Yes [provider]  promethazine (PHENERGAN) 12.5 MG tablet Take 12.5 mg by mouth Every Tuesday,Thursday,and Saturday with dialysis. May take an additional 12.5 mg dose every 6 hours as needed for nausea   Yes [provider]  sertraline (ZOLOFT) 100 MG tablet Take 200 mg by mouth  daily. (0900)   Yes [provider]  sertraline (ZOLOFT) 25 MG tablet Take 100 mg by mouth at bedtime. (2000)   Yes [provider]  traZODone (DESYREL) 50 MG tablet Take 50 mg by mouth at bedtime. (2100)   Yes [provider]  trolamine salicylate (ASPERCREME) 10 %  cream Apply 1 application topically as needed for muscle pain.   Yes [provider]  vitamin B-12 (CYANOCOBALAMIN) 100 MCG tablet Take 100 mcg by mouth daily. (0800)   Yes [provider]  vitamin C (ASCORBIC ACID) 500 MG tablet Take 500 mg by mouth 2 (two) times daily. (0900 & 2100)   Yes [provider]  zidovudine (RETROVIR) 100 MG capsule Take 100 mg by mouth 3 (three) times daily. (0900, 1300, & 2100)   Yes [provider]  Zinc 220 (50 Zn) MG CAPS Take 220 mg by mouth daily. (0800)   Yes [provider]  acetaminophen (TYLENOL) 650 MG CR tablet Take 650 mg by mouth every 8 (eight) hours as needed for pain.  Patient not taking: Reported on 09/29/2019    [provider]  ondansetron (ZOFRAN-ODT) 4 MG disintegrating tablet Take 4 mg by mouth every 8 (eight) hours as needed for nausea or vomiting. Patient not taking: Reported on 09/29/2019    [provider]    Allergies    Lactose intolerance (gi), Latex, and Penicillins  Review of Systems   Review of Systems  Constitutional: Positive for fatigue. Negative for appetite change, chills and fever.  HENT: Negative for ear pain, rhinorrhea, sneezing and sore throat.   Eyes: Negative for photophobia and visual disturbance.  Respiratory: Negative for cough, chest tightness, shortness of breath and wheezing.   Cardiovascular: Negative for chest pain and palpitations.  Gastrointestinal: Positive for blood in stool. Negative for abdominal pain, constipation, diarrhea, nausea and vomiting.  Genitourinary: Negative for dysuria, hematuria and urgency.  Musculoskeletal: Negative for myalgias.  Skin:  Negative for rash.  Neurological: Negative for dizziness, weakness and light-headedness.    Physical Exam Updated Vital Signs BP (!) 163/73 (BP Location: Left Leg)   Pulse 73   Temp 98.1 F (36.7 C) (Oral)   Resp 18   Ht 4\' 10"  (1.473 m)   Wt (!) 43 kg   SpO2 95%   BMI 19.81 kg/m   Physical Exam Vitals and nursing note reviewed. Exam conducted with a chaperone present.  Constitutional:      General: She is not in acute distress.    Appearance: She is well-developed.  HENT:     Head: Normocephalic and atraumatic.     Nose: Nose normal.  Eyes:     General: No scleral icterus.       Left eye: No discharge.     Conjunctiva/sclera: Conjunctivae normal.  Cardiovascular:     Rate and Rhythm: Normal rate and regular rhythm.     Heart sounds: Normal heart sounds. No murmur heard.  No friction rub. No gallop.   Pulmonary:     Effort: Pulmonary effort is normal. No respiratory distress.     Breath sounds: Normal breath sounds.  Abdominal:     General: Bowel sounds are normal. There is no distension.     Palpations: Abdomen is soft.     Tenderness: There is no abdominal tenderness. There is no guarding.  Genitourinary:    Comments: Brown stool noted on DRE. Musculoskeletal:        General: Normal range of motion.     Cervical back: Normal range of motion and neck supple.  Skin:    General: Skin is warm and dry.     Findings: No rash.  Neurological:     Mental Status: She is alert.     Motor: No abnormal muscle tone.     Coordination: Coordination normal.  ED Results / Procedures / Treatments   Labs (all labs ordered are listed, but only abnormal results are displayed) Labs Reviewed  COMPREHENSIVE METABOLIC PANEL - Abnormal; Notable for the following components:      Result Value   Glucose, Bld 135 (*)    Creatinine, Ser 5.17 (*)    Alkaline Phosphatase 160 (*)    GFR calc non Af Amer 8 (*)    GFR calc Af Amer 9 (*)    All other components within normal limits    CBC WITH DIFFERENTIAL/PLATELET - Abnormal; Notable for the following components:   WBC 3.0 (*)    RBC 2.24 (*)    Hemoglobin 7.6 (*)    HCT 25.8 (*)    MCV 115.2 (*)    MCHC 29.5 (*)    RDW 19.5 (*)    Platelets 128 (*)    Neutro Abs 1.6 (*)    All other components within normal limits  POC OCCULT BLOOD, ED  TYPE AND SCREEN    EKG None  Radiology No results found.  Procedures Procedures (including critical care time)  Medications Ordered in ED Medications - No data to display  ED Course  I have reviewed the triage vital signs and the nursing notes.  Pertinent labs & imaging results that were available during my care of the patient were reviewed by me and considered in my medical decision making (see chart for details).  Clinical Course as of Sep 28 1504  Fri Sep 29, 2019  1426 RN states that POC hemoccult is negative.   [HK]  1428 Hemoglobin(!): 7.6 [HK]  1455 WBC(!): 3.0 [HK]  1455 Pancytopenia chronically as followed by heme/onc clinic.  Platelets(!): 128 [HK]    Clinical Course User Index [HK] Delia Heady, PA-C   MDM Rules/Calculators/A&P                          69 year old female with a past medical history of ESRD on dialysis Tu, Th, Sat, HIV on antiretrovirals, hypertension presenting to the ED with a chief complaint of fatigue and abnormal lab work.  She was seen and evaluated in the heme-onc clinic for her pancytopenia follow-up on 09/13/2019.  She was found to have a hemoglobin of 6.1 and was transfused 1 unit of blood on 09/19/2019.  Yesterday she went to dialysis and told to come to the ER because of low hemoglobin.  She is unsure if they checked labs yesterday or if this is from 2 weeks ago.  She reports chronic fatigue for the past several months which was also noticed on her office visit on 09/13/2019 with heme-onc on.  She reports dark stools.  On exam abdomen is soft, nontender nondistended.  She denies chest pain, shortness of breath, injuries or  falls.  Rectal exam revealed brown stool in the vault.  Hemoccult negative.  Work-up here significant for hemoglobin of 7.6, her baseline appears to be around 8.  She is again pancytopenic but this does not appear worse than usual.  She remains hemodynamically stable here.  CMP is unremarkable, consistent with her history of ESRD.  We'll have her follow-up with heme-onc clinic regarding this chronic pancytopenia.  In the meantime we'll have her continue her dialysis sessions and return for worsening symptoms. Patient discussed with the attending, Dr. Laverta Baltimore.   Patient is hemodynamically stable, in NAD, and able to ambulate in the ED. Evaluation does not show pathology that would require ongoing emergent intervention  or inpatient treatment. I explained the diagnosis to the patient. Pain has been managed and has no complaints prior to discharge. Patient is comfortable with above plan and is stable for discharge at this time. All questions were answered prior to disposition. Strict return precautions for returning to the ED were discussed. Encouraged follow up with PCP.   An After Visit Summary was printed and given to the patient.   Portions of this note were generated with Lobbyist. Dictation errors may occur despite best attempts at proofreading.  Final Clinical Impression(s) / ED Diagnoses Final diagnoses:  Anemia of chronic disease    Rx / DC Orders ED Discharge Orders    None       Delia Heady, PA-C 09/29/19 1506    Margette Fast, MD 10/05/19 [phone]

## 2019-09-29 NOTE — Discharge Instructions (Addendum)
Follow-up with your primary care provider and your hematology/oncology specialist. Return to the ER if you develop chest pain, shortness of breath, numbness in arms or legs, injuries or falls, bloody stools or severe abdominal pain. Continue your dialysis sessions as regularly scheduled.

## 2019-09-30 DIAGNOSIS — Z992 Dependence on renal dialysis: Secondary | ICD-10-CM | POA: Diagnosis not present

## 2019-09-30 DIAGNOSIS — N186 End stage renal disease: Secondary | ICD-10-CM | POA: Diagnosis not present

## 2019-09-30 DIAGNOSIS — N2581 Secondary hyperparathyroidism of renal origin: Secondary | ICD-10-CM | POA: Diagnosis not present

## 2019-09-30 DIAGNOSIS — D631 Anemia in chronic kidney disease: Secondary | ICD-10-CM | POA: Diagnosis not present

## 2019-09-30 DIAGNOSIS — D472 Monoclonal gammopathy: Secondary | ICD-10-CM | POA: Diagnosis not present

## 2019-09-30 DIAGNOSIS — D509 Iron deficiency anemia, unspecified: Secondary | ICD-10-CM | POA: Diagnosis not present

## 2019-10-02 ENCOUNTER — Encounter: Payer: Self-pay | Admitting: Nurse Practitioner

## 2019-10-03 DIAGNOSIS — N186 End stage renal disease: Secondary | ICD-10-CM | POA: Diagnosis not present

## 2019-10-03 DIAGNOSIS — N2581 Secondary hyperparathyroidism of renal origin: Secondary | ICD-10-CM | POA: Diagnosis not present

## 2019-10-03 DIAGNOSIS — D631 Anemia in chronic kidney disease: Secondary | ICD-10-CM | POA: Diagnosis not present

## 2019-10-03 DIAGNOSIS — D509 Iron deficiency anemia, unspecified: Secondary | ICD-10-CM | POA: Diagnosis not present

## 2019-10-03 DIAGNOSIS — Z992 Dependence on renal dialysis: Secondary | ICD-10-CM | POA: Diagnosis not present

## 2019-10-03 DIAGNOSIS — D472 Monoclonal gammopathy: Secondary | ICD-10-CM | POA: Diagnosis not present

## 2019-10-05 DIAGNOSIS — N2581 Secondary hyperparathyroidism of renal origin: Secondary | ICD-10-CM | POA: Diagnosis not present

## 2019-10-05 DIAGNOSIS — D509 Iron deficiency anemia, unspecified: Secondary | ICD-10-CM | POA: Diagnosis not present

## 2019-10-05 DIAGNOSIS — N186 End stage renal disease: Secondary | ICD-10-CM | POA: Diagnosis not present

## 2019-10-05 DIAGNOSIS — D631 Anemia in chronic kidney disease: Secondary | ICD-10-CM | POA: Diagnosis not present

## 2019-10-05 DIAGNOSIS — Z992 Dependence on renal dialysis: Secondary | ICD-10-CM | POA: Diagnosis not present

## 2019-10-05 DIAGNOSIS — D472 Monoclonal gammopathy: Secondary | ICD-10-CM | POA: Diagnosis not present

## 2019-10-06 DIAGNOSIS — E46 Unspecified protein-calorie malnutrition: Secondary | ICD-10-CM | POA: Diagnosis not present

## 2019-10-06 DIAGNOSIS — L89150 Pressure ulcer of sacral region, unstageable: Secondary | ICD-10-CM | POA: Diagnosis not present

## 2019-10-06 DIAGNOSIS — N186 End stage renal disease: Secondary | ICD-10-CM | POA: Diagnosis not present

## 2019-10-06 DIAGNOSIS — Z992 Dependence on renal dialysis: Secondary | ICD-10-CM | POA: Diagnosis not present

## 2019-10-06 DIAGNOSIS — G894 Chronic pain syndrome: Secondary | ICD-10-CM | POA: Diagnosis not present

## 2019-10-07 DIAGNOSIS — N186 End stage renal disease: Secondary | ICD-10-CM | POA: Diagnosis not present

## 2019-10-07 DIAGNOSIS — D631 Anemia in chronic kidney disease: Secondary | ICD-10-CM | POA: Diagnosis not present

## 2019-10-07 DIAGNOSIS — D472 Monoclonal gammopathy: Secondary | ICD-10-CM | POA: Diagnosis not present

## 2019-10-07 DIAGNOSIS — Z992 Dependence on renal dialysis: Secondary | ICD-10-CM | POA: Diagnosis not present

## 2019-10-07 DIAGNOSIS — N2581 Secondary hyperparathyroidism of renal origin: Secondary | ICD-10-CM | POA: Diagnosis not present

## 2019-10-07 DIAGNOSIS — D509 Iron deficiency anemia, unspecified: Secondary | ICD-10-CM | POA: Diagnosis not present

## 2019-10-10 DIAGNOSIS — D631 Anemia in chronic kidney disease: Secondary | ICD-10-CM | POA: Diagnosis not present

## 2019-10-10 DIAGNOSIS — Z992 Dependence on renal dialysis: Secondary | ICD-10-CM | POA: Diagnosis not present

## 2019-10-10 DIAGNOSIS — N186 End stage renal disease: Secondary | ICD-10-CM | POA: Diagnosis not present

## 2019-10-10 DIAGNOSIS — D472 Monoclonal gammopathy: Secondary | ICD-10-CM | POA: Diagnosis not present

## 2019-10-10 DIAGNOSIS — N2581 Secondary hyperparathyroidism of renal origin: Secondary | ICD-10-CM | POA: Diagnosis not present

## 2019-10-10 DIAGNOSIS — D509 Iron deficiency anemia, unspecified: Secondary | ICD-10-CM | POA: Diagnosis not present

## 2019-10-12 DIAGNOSIS — D472 Monoclonal gammopathy: Secondary | ICD-10-CM | POA: Diagnosis not present

## 2019-10-12 DIAGNOSIS — D509 Iron deficiency anemia, unspecified: Secondary | ICD-10-CM | POA: Diagnosis not present

## 2019-10-12 DIAGNOSIS — Z992 Dependence on renal dialysis: Secondary | ICD-10-CM | POA: Diagnosis not present

## 2019-10-12 DIAGNOSIS — D631 Anemia in chronic kidney disease: Secondary | ICD-10-CM | POA: Diagnosis not present

## 2019-10-12 DIAGNOSIS — N2581 Secondary hyperparathyroidism of renal origin: Secondary | ICD-10-CM | POA: Diagnosis not present

## 2019-10-12 DIAGNOSIS — N186 End stage renal disease: Secondary | ICD-10-CM | POA: Diagnosis not present

## 2019-10-14 DIAGNOSIS — N2581 Secondary hyperparathyroidism of renal origin: Secondary | ICD-10-CM | POA: Diagnosis not present

## 2019-10-14 DIAGNOSIS — D472 Monoclonal gammopathy: Secondary | ICD-10-CM | POA: Diagnosis not present

## 2019-10-14 DIAGNOSIS — D631 Anemia in chronic kidney disease: Secondary | ICD-10-CM | POA: Diagnosis not present

## 2019-10-14 DIAGNOSIS — D509 Iron deficiency anemia, unspecified: Secondary | ICD-10-CM | POA: Diagnosis not present

## 2019-10-14 DIAGNOSIS — N186 End stage renal disease: Secondary | ICD-10-CM | POA: Diagnosis not present

## 2019-10-14 DIAGNOSIS — Z992 Dependence on renal dialysis: Secondary | ICD-10-CM | POA: Diagnosis not present

## 2019-10-17 DIAGNOSIS — N2581 Secondary hyperparathyroidism of renal origin: Secondary | ICD-10-CM | POA: Diagnosis not present

## 2019-10-17 DIAGNOSIS — D509 Iron deficiency anemia, unspecified: Secondary | ICD-10-CM | POA: Diagnosis not present

## 2019-10-17 DIAGNOSIS — D631 Anemia in chronic kidney disease: Secondary | ICD-10-CM | POA: Diagnosis not present

## 2019-10-17 DIAGNOSIS — Z992 Dependence on renal dialysis: Secondary | ICD-10-CM | POA: Diagnosis not present

## 2019-10-17 DIAGNOSIS — N186 End stage renal disease: Secondary | ICD-10-CM | POA: Diagnosis not present

## 2019-10-17 DIAGNOSIS — D472 Monoclonal gammopathy: Secondary | ICD-10-CM | POA: Diagnosis not present

## 2019-10-18 ENCOUNTER — Inpatient Hospital Stay (HOSPITAL_COMMUNITY): Payer: Medicare Other | Attending: Hematology

## 2019-10-18 ENCOUNTER — Other Ambulatory Visit: Payer: Self-pay

## 2019-10-18 DIAGNOSIS — D61818 Other pancytopenia: Secondary | ICD-10-CM

## 2019-10-18 DIAGNOSIS — R1084 Generalized abdominal pain: Secondary | ICD-10-CM | POA: Diagnosis not present

## 2019-10-18 DIAGNOSIS — N186 End stage renal disease: Secondary | ICD-10-CM | POA: Diagnosis not present

## 2019-10-18 DIAGNOSIS — F330 Major depressive disorder, recurrent, mild: Secondary | ICD-10-CM | POA: Diagnosis not present

## 2019-10-18 DIAGNOSIS — F0391 Unspecified dementia with behavioral disturbance: Secondary | ICD-10-CM | POA: Diagnosis not present

## 2019-10-18 DIAGNOSIS — Z20822 Contact with and (suspected) exposure to covid-19: Secondary | ICD-10-CM | POA: Diagnosis not present

## 2019-10-18 DIAGNOSIS — D649 Anemia, unspecified: Secondary | ICD-10-CM

## 2019-10-18 DIAGNOSIS — F331 Major depressive disorder, recurrent, moderate: Secondary | ICD-10-CM | POA: Diagnosis not present

## 2019-10-18 DIAGNOSIS — K8689 Other specified diseases of pancreas: Secondary | ICD-10-CM | POA: Diagnosis not present

## 2019-10-18 DIAGNOSIS — F064 Anxiety disorder due to known physiological condition: Secondary | ICD-10-CM | POA: Diagnosis not present

## 2019-10-18 DIAGNOSIS — G4701 Insomnia due to medical condition: Secondary | ICD-10-CM | POA: Diagnosis not present

## 2019-10-18 DIAGNOSIS — N2581 Secondary hyperparathyroidism of renal origin: Secondary | ICD-10-CM | POA: Diagnosis not present

## 2019-10-18 DIAGNOSIS — Z21 Asymptomatic human immunodeficiency virus [HIV] infection status: Secondary | ICD-10-CM | POA: Diagnosis not present

## 2019-10-18 DIAGNOSIS — A0472 Enterocolitis due to Clostridium difficile, not specified as recurrent: Secondary | ICD-10-CM | POA: Diagnosis not present

## 2019-10-18 DIAGNOSIS — I120 Hypertensive chronic kidney disease with stage 5 chronic kidney disease or end stage renal disease: Secondary | ICD-10-CM | POA: Diagnosis not present

## 2019-10-18 DIAGNOSIS — F5101 Primary insomnia: Secondary | ICD-10-CM | POA: Diagnosis not present

## 2019-10-18 DIAGNOSIS — K8020 Calculus of gallbladder without cholecystitis without obstruction: Secondary | ICD-10-CM | POA: Diagnosis not present

## 2019-10-18 DIAGNOSIS — K429 Umbilical hernia without obstruction or gangrene: Secondary | ICD-10-CM | POA: Diagnosis not present

## 2019-10-18 DIAGNOSIS — K439 Ventral hernia without obstruction or gangrene: Secondary | ICD-10-CM | POA: Diagnosis not present

## 2019-10-18 DIAGNOSIS — K529 Noninfective gastroenteritis and colitis, unspecified: Secondary | ICD-10-CM | POA: Diagnosis not present

## 2019-10-18 DIAGNOSIS — R112 Nausea with vomiting, unspecified: Secondary | ICD-10-CM | POA: Diagnosis not present

## 2019-10-18 LAB — COMPREHENSIVE METABOLIC PANEL
ALT: 9 U/L (ref 0–44)
AST: 15 U/L (ref 15–41)
Albumin: 3.3 g/dL — ABNORMAL LOW (ref 3.5–5.0)
Alkaline Phosphatase: 139 U/L — ABNORMAL HIGH (ref 38–126)
Anion gap: 12 (ref 5–15)
BUN: 32 mg/dL — ABNORMAL HIGH (ref 8–23)
CO2: 28 mmol/L (ref 22–32)
Calcium: 9.5 mg/dL (ref 8.9–10.3)
Chloride: 99 mmol/L (ref 98–111)
Creatinine, Ser: 6.15 mg/dL — ABNORMAL HIGH (ref 0.44–1.00)
GFR calc Af Amer: 8 mL/min — ABNORMAL LOW (ref >60)
GFR calc non Af Amer: 7 mL/min — ABNORMAL LOW (ref >60)
Glucose, Bld: 94 mg/dL (ref 70–99)
Potassium: 3.8 mmol/L (ref 3.5–5.1)
Sodium: 139 mmol/L (ref 135–145)
Total Bilirubin: 0.4 mg/dL (ref 0.3–1.2)
Total Protein: 8.1 g/dL (ref 6.5–8.1)

## 2019-10-18 LAB — VITAMIN B12: Vitamin B-12: 1645 pg/mL — ABNORMAL HIGH (ref 180–914)

## 2019-10-18 LAB — CBC WITH DIFFERENTIAL/PLATELET
Abs Immature Granulocytes: 0.01 10*3/uL (ref 0.00–0.07)
Basophils Absolute: 0 10*3/uL (ref 0.0–0.1)
Basophils Relative: 0 %
Eosinophils Absolute: 0.1 10*3/uL (ref 0.0–0.5)
Eosinophils Relative: 2 %
HCT: 24.5 % — ABNORMAL LOW (ref 36.0–46.0)
Hemoglobin: 7.1 g/dL — ABNORMAL LOW (ref 12.0–15.0)
Immature Granulocytes: 0 %
Lymphocytes Relative: 20 %
Lymphs Abs: 0.6 10*3/uL — ABNORMAL LOW (ref 0.7–4.0)
MCH: 33 pg (ref 26.0–34.0)
MCHC: 29 g/dL — ABNORMAL LOW (ref 30.0–36.0)
MCV: 114 fL — ABNORMAL HIGH (ref 80.0–100.0)
Monocytes Absolute: 0.3 10*3/uL (ref 0.1–1.0)
Monocytes Relative: 10 %
Neutro Abs: 2.2 10*3/uL (ref 1.7–7.7)
Neutrophils Relative %: 68 %
Platelets: 144 10*3/uL — ABNORMAL LOW (ref 150–400)
RBC: 2.15 MIL/uL — ABNORMAL LOW (ref 3.87–5.11)
RDW: 17.4 % — ABNORMAL HIGH (ref 11.5–15.5)
WBC: 3.2 10*3/uL — ABNORMAL LOW (ref 4.0–10.5)
nRBC: 0 % (ref 0.0–0.2)

## 2019-10-18 LAB — LACTATE DEHYDROGENASE: LDH: 101 U/L (ref 98–192)

## 2019-10-18 LAB — VITAMIN D 25 HYDROXY (VIT D DEFICIENCY, FRACTURES): Vit D, 25-Hydroxy: 21.58 ng/mL — ABNORMAL LOW (ref 30–100)

## 2019-10-18 LAB — IRON AND TIBC
Iron: 30 ug/dL (ref 28–170)
Saturation Ratios: 19 % (ref 10.4–31.8)
TIBC: 161 ug/dL — ABNORMAL LOW (ref 250–450)
UIBC: 131 ug/dL

## 2019-10-18 LAB — FERRITIN: Ferritin: 555 ng/mL — ABNORMAL HIGH (ref 11–307)

## 2019-10-19 ENCOUNTER — Other Ambulatory Visit (HOSPITAL_COMMUNITY): Payer: Self-pay

## 2019-10-20 ENCOUNTER — Other Ambulatory Visit: Payer: Self-pay

## 2019-10-20 ENCOUNTER — Encounter (HOSPITAL_COMMUNITY): Payer: Self-pay | Admitting: Emergency Medicine

## 2019-10-20 ENCOUNTER — Inpatient Hospital Stay (HOSPITAL_COMMUNITY): Payer: Medicare Other

## 2019-10-20 ENCOUNTER — Inpatient Hospital Stay (HOSPITAL_COMMUNITY)
Admission: EM | Admit: 2019-10-20 | Discharge: 2019-10-22 | DRG: 371 | Disposition: A | Discharge status: Other Acute Inpatient Hospital | Payer: Medicare Other | Source: Skilled Nursing Facility | Attending: Internal Medicine | Admitting: Internal Medicine

## 2019-10-20 DIAGNOSIS — I10 Essential (primary) hypertension: Secondary | ICD-10-CM | POA: Diagnosis not present

## 2019-10-20 DIAGNOSIS — R1084 Generalized abdominal pain: Secondary | ICD-10-CM

## 2019-10-20 DIAGNOSIS — Z7902 Long term (current) use of antithrombotics/antiplatelets: Secondary | ICD-10-CM

## 2019-10-20 DIAGNOSIS — Z992 Dependence on renal dialysis: Secondary | ICD-10-CM

## 2019-10-20 DIAGNOSIS — Z21 Asymptomatic human immunodeficiency virus [HIV] infection status: Secondary | ICD-10-CM | POA: Diagnosis present

## 2019-10-20 DIAGNOSIS — R0902 Hypoxemia: Secondary | ICD-10-CM | POA: Diagnosis not present

## 2019-10-20 DIAGNOSIS — Z87891 Personal history of nicotine dependence: Secondary | ICD-10-CM

## 2019-10-20 DIAGNOSIS — I120 Hypertensive chronic kidney disease with stage 5 chronic kidney disease or end stage renal disease: Secondary | ICD-10-CM | POA: Diagnosis not present

## 2019-10-20 DIAGNOSIS — N186 End stage renal disease: Secondary | ICD-10-CM | POA: Diagnosis present

## 2019-10-20 DIAGNOSIS — K838 Other specified diseases of biliary tract: Secondary | ICD-10-CM | POA: Diagnosis present

## 2019-10-20 DIAGNOSIS — R197 Diarrhea, unspecified: Secondary | ICD-10-CM | POA: Diagnosis not present

## 2019-10-20 DIAGNOSIS — Z8673 Personal history of transient ischemic attack (TIA), and cerebral infarction without residual deficits: Secondary | ICD-10-CM

## 2019-10-20 DIAGNOSIS — G894 Chronic pain syndrome: Secondary | ICD-10-CM | POA: Diagnosis present

## 2019-10-20 DIAGNOSIS — I959 Hypotension, unspecified: Secondary | ICD-10-CM | POA: Diagnosis present

## 2019-10-20 DIAGNOSIS — N2581 Secondary hyperparathyroidism of renal origin: Secondary | ICD-10-CM | POA: Diagnosis present

## 2019-10-20 DIAGNOSIS — R1110 Vomiting, unspecified: Secondary | ICD-10-CM

## 2019-10-20 DIAGNOSIS — R1031 Right lower quadrant pain: Secondary | ICD-10-CM | POA: Diagnosis not present

## 2019-10-20 DIAGNOSIS — Z79899 Other long term (current) drug therapy: Secondary | ICD-10-CM

## 2019-10-20 DIAGNOSIS — Z20822 Contact with and (suspected) exposure to covid-19: Secondary | ICD-10-CM | POA: Diagnosis present

## 2019-10-20 DIAGNOSIS — Z79891 Long term (current) use of opiate analgesic: Secondary | ICD-10-CM

## 2019-10-20 DIAGNOSIS — B20 Human immunodeficiency virus [HIV] disease: Secondary | ICD-10-CM | POA: Diagnosis present

## 2019-10-20 DIAGNOSIS — D638 Anemia in other chronic diseases classified elsewhere: Secondary | ICD-10-CM | POA: Diagnosis present

## 2019-10-20 DIAGNOSIS — K529 Noninfective gastroenteritis and colitis, unspecified: Secondary | ICD-10-CM | POA: Diagnosis present

## 2019-10-20 DIAGNOSIS — A0472 Enterocolitis due to Clostridium difficile, not specified as recurrent: Principal | ICD-10-CM | POA: Diagnosis present

## 2019-10-20 DIAGNOSIS — R112 Nausea with vomiting, unspecified: Secondary | ICD-10-CM | POA: Diagnosis not present

## 2019-10-20 DIAGNOSIS — D631 Anemia in chronic kidney disease: Secondary | ICD-10-CM | POA: Diagnosis present

## 2019-10-20 DIAGNOSIS — D539 Nutritional anemia, unspecified: Secondary | ICD-10-CM | POA: Diagnosis present

## 2019-10-20 MED ORDER — SODIUM CHLORIDE 0.9% IV SOLUTION: 250.0000 mL | Freq: Once | INTRAVENOUS | Status: DC

## 2019-10-20 MED ORDER — ACETAMINOPHEN 325 MG PO TABS: 650.0000 mg | ORAL_TABLET | Freq: Once | ORAL | Status: AC

## 2019-10-20 MED ORDER — SODIUM CHLORIDE 0.9% FLUSH: 10.0000 mL | INTRAVENOUS | Status: AC | PRN

## 2019-10-20 MED ORDER — DIPHENHYDRAMINE HCL 25 MG PO CAPS: 25.0000 mg | ORAL_CAPSULE | Freq: Once | ORAL | Status: AC

## 2019-10-20 NOTE — ED Triage Notes (Signed)
Pt brought in from [facility] with c/o "raging vomiting and diarrhea", as well RLQ abdominal pain that started Thursday after she ate some pasta. Pt states it was "so bad that she missed her dialysis on Thursday".

## 2019-10-21 ENCOUNTER — Other Ambulatory Visit: Payer: Self-pay

## 2019-10-21 ENCOUNTER — Encounter (HOSPITAL_COMMUNITY): Payer: Self-pay | Admitting: Family Medicine

## 2019-10-21 ENCOUNTER — Emergency Department (HOSPITAL_COMMUNITY): Payer: Medicare Other

## 2019-10-21 DIAGNOSIS — R531 Weakness: Secondary | ICD-10-CM | POA: Diagnosis not present

## 2019-10-21 DIAGNOSIS — K429 Umbilical hernia without obstruction or gangrene: Secondary | ICD-10-CM | POA: Diagnosis not present

## 2019-10-21 DIAGNOSIS — Z992 Dependence on renal dialysis: Secondary | ICD-10-CM

## 2019-10-21 DIAGNOSIS — D638 Anemia in other chronic diseases classified elsewhere: Secondary | ICD-10-CM | POA: Diagnosis not present

## 2019-10-21 DIAGNOSIS — K838 Other specified diseases of biliary tract: Secondary | ICD-10-CM | POA: Diagnosis not present

## 2019-10-21 DIAGNOSIS — Z7401 Bed confinement status: Secondary | ICD-10-CM | POA: Diagnosis not present

## 2019-10-21 DIAGNOSIS — B20 Human immunodeficiency virus [HIV] disease: Secondary | ICD-10-CM | POA: Diagnosis not present

## 2019-10-21 DIAGNOSIS — K529 Noninfective gastroenteritis and colitis, unspecified: Secondary | ICD-10-CM

## 2019-10-21 DIAGNOSIS — Z20822 Contact with and (suspected) exposure to covid-19: Secondary | ICD-10-CM | POA: Diagnosis present

## 2019-10-21 DIAGNOSIS — N186 End stage renal disease: Secondary | ICD-10-CM | POA: Diagnosis not present

## 2019-10-21 DIAGNOSIS — Z79891 Long term (current) use of opiate analgesic: Secondary | ICD-10-CM | POA: Diagnosis not present

## 2019-10-21 DIAGNOSIS — A0472 Enterocolitis due to Clostridium difficile, not specified as recurrent: Secondary | ICD-10-CM | POA: Diagnosis present

## 2019-10-21 DIAGNOSIS — K439 Ventral hernia without obstruction or gangrene: Secondary | ICD-10-CM | POA: Diagnosis not present

## 2019-10-21 DIAGNOSIS — G894 Chronic pain syndrome: Secondary | ICD-10-CM | POA: Diagnosis present

## 2019-10-21 DIAGNOSIS — Z8673 Personal history of transient ischemic attack (TIA), and cerebral infarction without residual deficits: Secondary | ICD-10-CM | POA: Diagnosis not present

## 2019-10-21 DIAGNOSIS — Z21 Asymptomatic human immunodeficiency virus [HIV] infection status: Secondary | ICD-10-CM | POA: Diagnosis present

## 2019-10-21 DIAGNOSIS — I120 Hypertensive chronic kidney disease with stage 5 chronic kidney disease or end stage renal disease: Secondary | ICD-10-CM | POA: Diagnosis not present

## 2019-10-21 DIAGNOSIS — I959 Hypotension, unspecified: Secondary | ICD-10-CM | POA: Diagnosis present

## 2019-10-21 DIAGNOSIS — Z87891 Personal history of nicotine dependence: Secondary | ICD-10-CM | POA: Diagnosis not present

## 2019-10-21 DIAGNOSIS — Z79899 Other long term (current) drug therapy: Secondary | ICD-10-CM | POA: Diagnosis not present

## 2019-10-21 DIAGNOSIS — D631 Anemia in chronic kidney disease: Secondary | ICD-10-CM | POA: Diagnosis present

## 2019-10-21 DIAGNOSIS — N2581 Secondary hyperparathyroidism of renal origin: Secondary | ICD-10-CM | POA: Diagnosis not present

## 2019-10-21 DIAGNOSIS — E8770 Fluid overload, unspecified: Secondary | ICD-10-CM | POA: Diagnosis not present

## 2019-10-21 DIAGNOSIS — K8689 Other specified diseases of pancreas: Secondary | ICD-10-CM | POA: Diagnosis not present

## 2019-10-21 DIAGNOSIS — Z7902 Long term (current) use of antithrombotics/antiplatelets: Secondary | ICD-10-CM | POA: Diagnosis not present

## 2019-10-21 DIAGNOSIS — K8020 Calculus of gallbladder without cholecystitis without obstruction: Secondary | ICD-10-CM | POA: Diagnosis not present

## 2019-10-21 LAB — CBC
HCT: 23 % — ABNORMAL LOW (ref 36.0–46.0)
Hemoglobin: 6.8 g/dL — CL (ref 12.0–15.0)
MCH: 33.7 pg (ref 26.0–34.0)
MCHC: 29.6 g/dL — ABNORMAL LOW (ref 30.0–36.0)
MCV: 113.9 fL — ABNORMAL HIGH (ref 80.0–100.0)
Platelets: 168 10*3/uL (ref 150–400)
RBC: 2.02 MIL/uL — ABNORMAL LOW (ref 3.87–5.11)
RDW: 17.1 % — ABNORMAL HIGH (ref 11.5–15.5)
WBC: 5.1 10*3/uL (ref 4.0–10.5)
nRBC: 0 % (ref 0.0–0.2)

## 2019-10-21 LAB — ABO/RH: ABO/RH(D): O POS

## 2019-10-21 LAB — COMPREHENSIVE METABOLIC PANEL
ALT: 8 U/L (ref 0–44)
AST: 13 U/L — ABNORMAL LOW (ref 15–41)
Albumin: 3.3 g/dL — ABNORMAL LOW (ref 3.5–5.0)
Alkaline Phosphatase: 131 U/L — ABNORMAL HIGH (ref 38–126)
Anion gap: 15 (ref 5–15)
BUN: 52 mg/dL — ABNORMAL HIGH (ref 8–23)
CO2: 24 mmol/L (ref 22–32)
Calcium: 9.2 mg/dL (ref 8.9–10.3)
Chloride: 97 mmol/L — ABNORMAL LOW (ref 98–111)
Creatinine, Ser: 10.99 mg/dL — ABNORMAL HIGH (ref 0.44–1.00)
GFR calc Af Amer: 4 mL/min — ABNORMAL LOW (ref >60)
GFR calc non Af Amer: 3 mL/min — ABNORMAL LOW (ref >60)
Glucose, Bld: 92 mg/dL (ref 70–99)
Potassium: 4 mmol/L (ref 3.5–5.1)
Sodium: 136 mmol/L (ref 135–145)
Total Bilirubin: 0.6 mg/dL (ref 0.3–1.2)
Total Protein: 7.9 g/dL (ref 6.5–8.1)

## 2019-10-21 LAB — METHYLMALONIC ACID, SERUM: Methylmalonic Acid, Quantitative: 528 nmol/L — ABNORMAL HIGH (ref 0–378)

## 2019-10-21 LAB — PREPARE RBC (CROSSMATCH)

## 2019-10-21 LAB — LIPASE, BLOOD: Lipase: 51 U/L (ref 11–51)

## 2019-10-21 LAB — SARS CORONAVIRUS 2 BY RT PCR (HOSPITAL ORDER, PERFORMED IN ~~LOC~~ HOSPITAL LAB): SARS Coronavirus 2: NEGATIVE

## 2019-10-21 IMAGING — CT CT ABD-PELV W/ CM
2 of 5 series · 14 of 46 positions shown, 16 images · IV contrast (Omnipaque or Isovue)
Comparison: CT the abdomen and pelvis [DATE].

CLINICAL DATA: 68-year-old female with history of abdominal pain.
Suspected abdominal hernia.

EXAM:
CT ABDOMEN AND PELVIS WITH CONTRAST
TECHNIQUE: Multidetector CT imaging of the abdomen and pelvis was performed
using the standard protocol following bolus administration of
intravenous contrast.
CONTRAST:  100mL OMNIPAQUE IOHEXOL 300 MG/ML  SOLN

[Series 2: axial st · axial · 0.63mm/px · z∈[+879,+1224]mm · 11 of 77 slices shown, 13 images]
[im 4/77  soft-tissue]
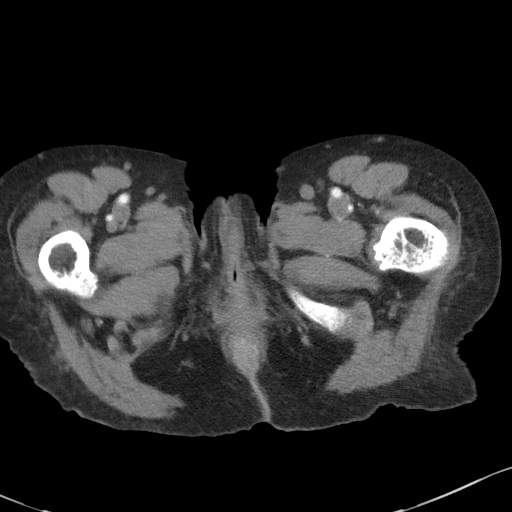
[im 4/77  bone]
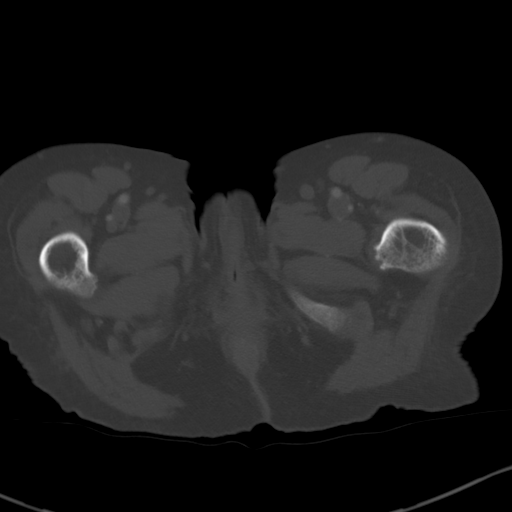
[im 12/77  soft-tissue]
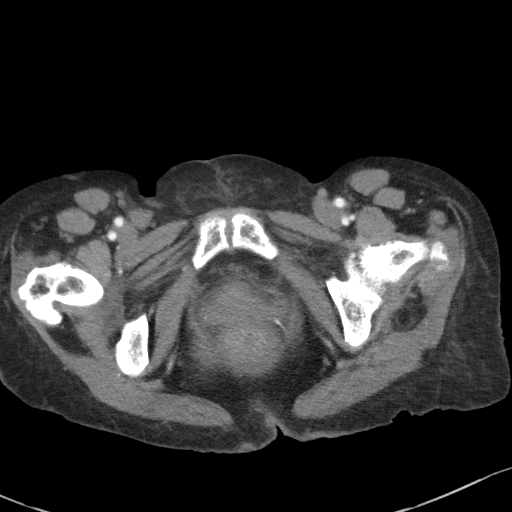
[im 20/77  soft-tissue]
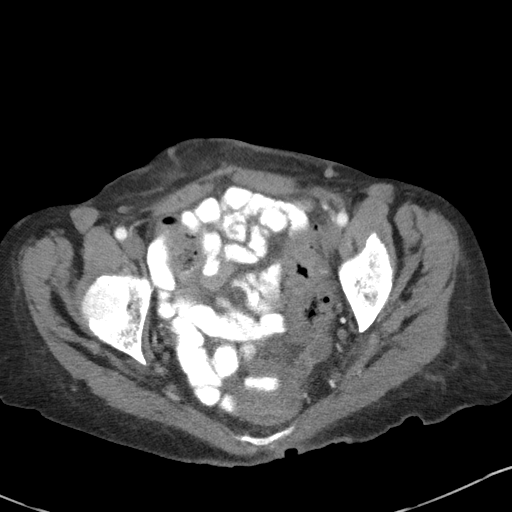
[im 27/77  soft-tissue]
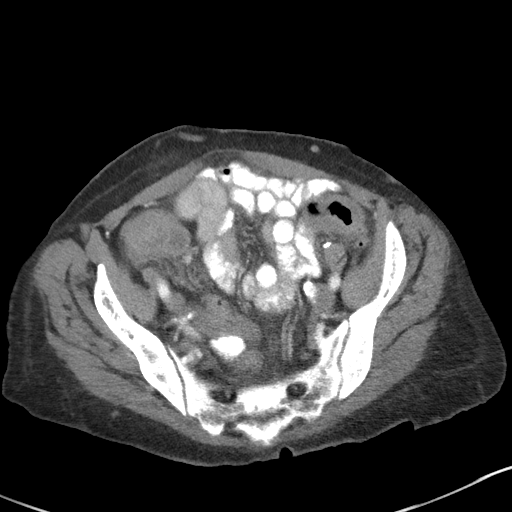
[im 31/77  soft-tissue]
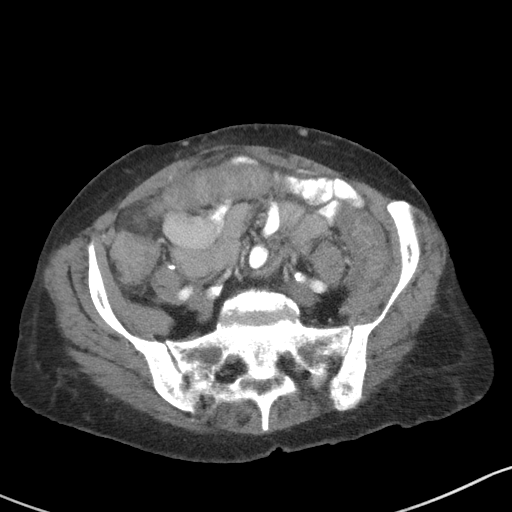
[im 39/77  soft-tissue]
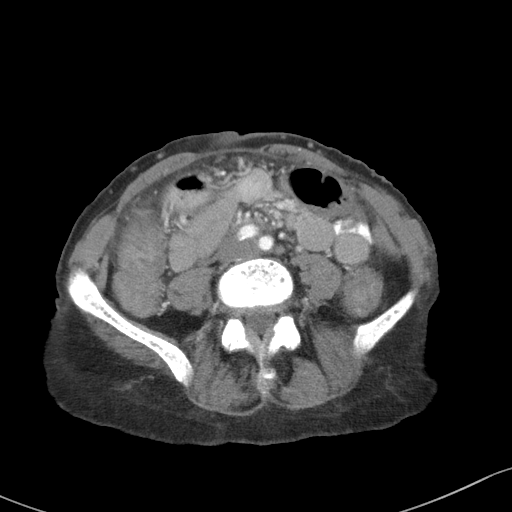
[im 46/77  soft-tissue]
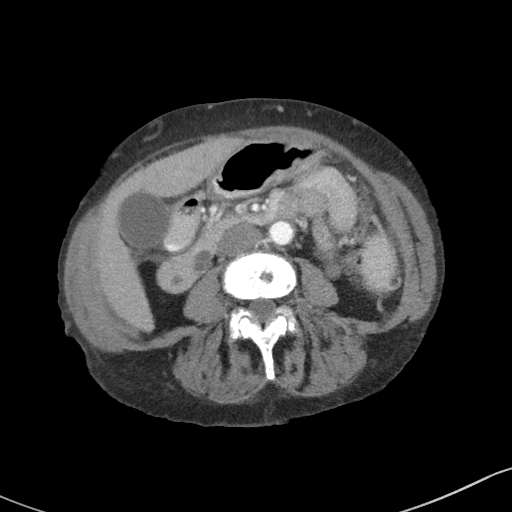
[im 50/77  soft-tissue]
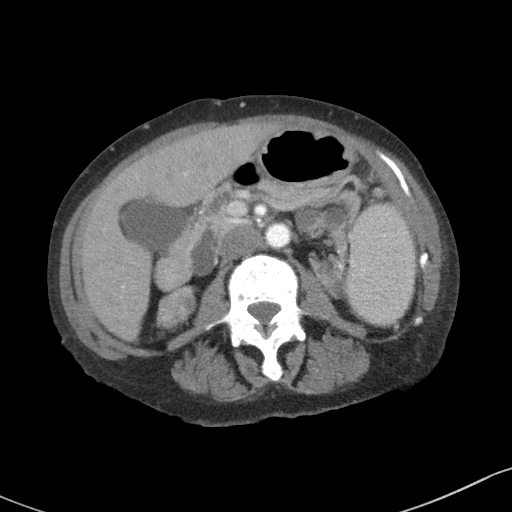
[im 58/77  soft-tissue]
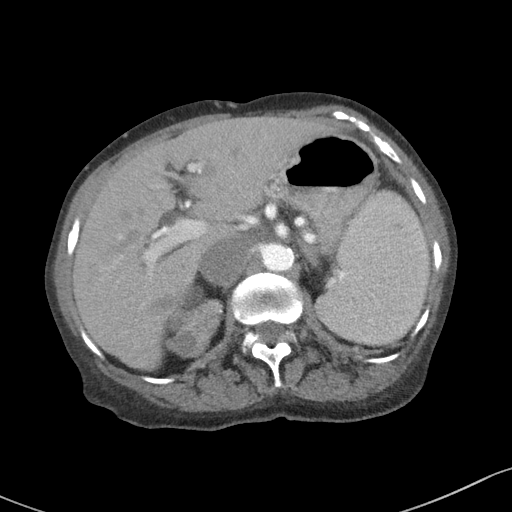
[im 58/77  bone]
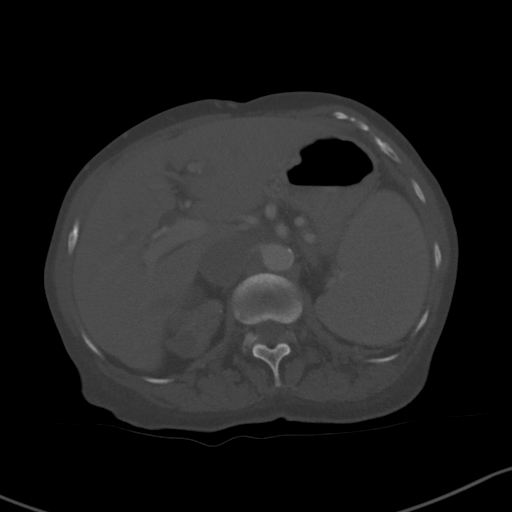
[im 65/77  soft-tissue]
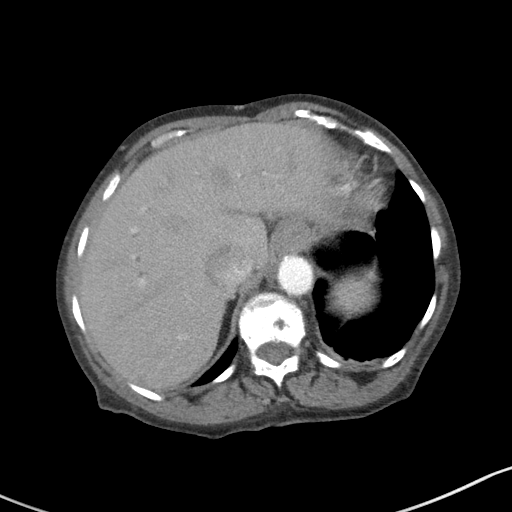
[im 73/77  soft-tissue]
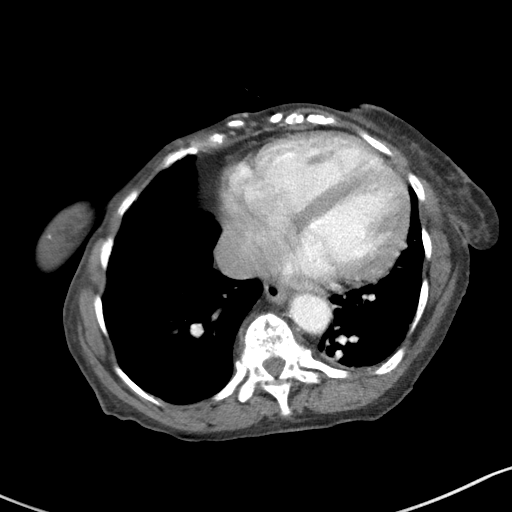

[Series 5: coronal st · coronal · 0.64mm/px · 3 of 82 slices shown]
[im 28/82  soft-tissue]
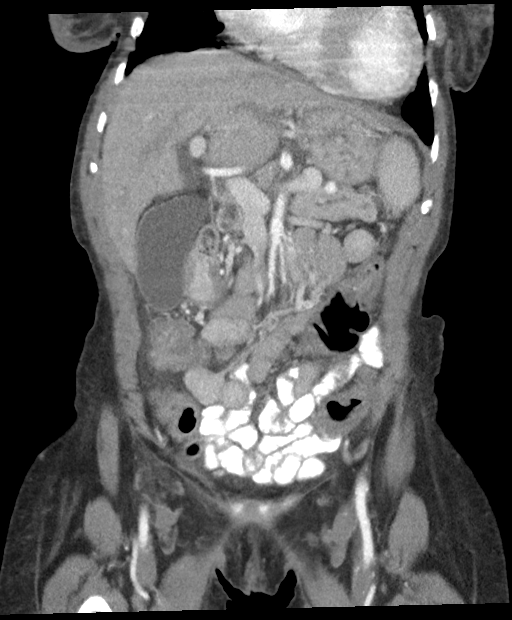
[im 37/82  soft-tissue]
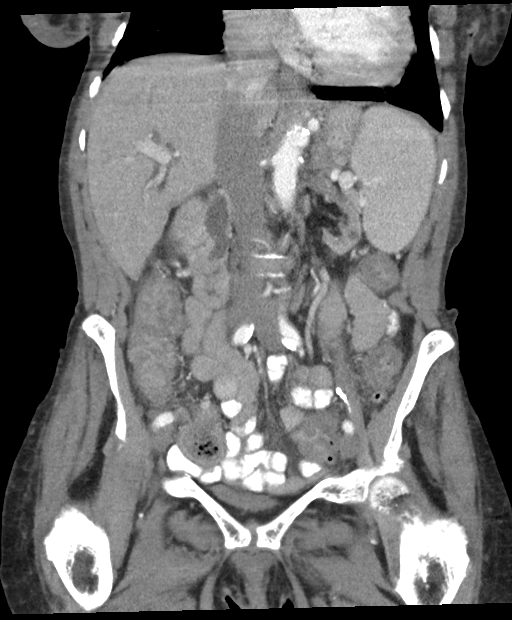
[im 46/82  soft-tissue]
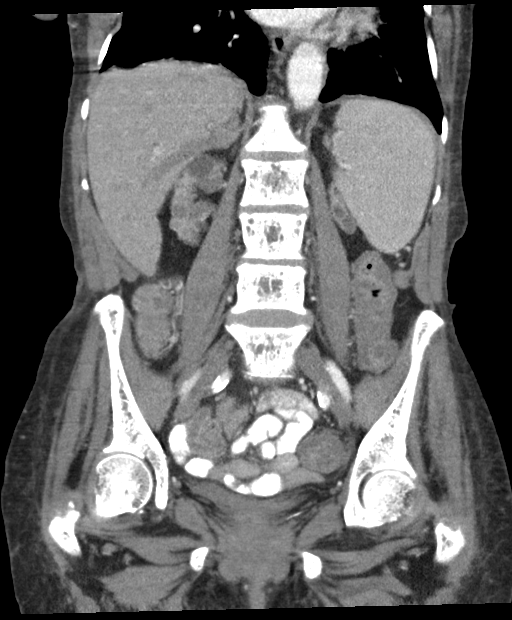

[14 of 46 positions shown; findings below may reference images not displayed]

FINDINGS: Lower chest: Cardiomegaly.

Hepatobiliary: No suspicious cystic or solid hepatic lesions. No
intrahepatic biliary ductal dilatation. 5 mm calcified gallstone
lying dependently in the gallbladder. Common bile duct is dilated
measuring 14 mm in the porta hepatis, similar to the prior
examination. No calcified stone identified in the common bile duct.
No findings to suggest an acute cholecystitis at this time.

Pancreas: No pancreatic mass. Persistent pancreatic ductal
dilatation measuring up to 7 mm in the pancreatic head, similar to
the prior study from a [DATE]. No pancreatic or peripancreatic
fluid collections or inflammatory changes.

Spleen: Unremarkable.

Adrenals/Urinary Tract: Severe atrophy of the kidneys bilaterally.
Numerous small renal lesions bilaterally ranging from low to high
attenuation, many of which are subcentimeter in size and too small
to characterize, but favored to represent cysts and
proteinaceous/hemorrhagic cyst. Several indeterminate lesions are
also noted, most notable for a 1.4 cm lesion measuring 59 HU in the
lateral aspect of the interpolar region of the right kidney (axial
image 23 of series 2). Multiple tiny calcifications are present in
both renal collecting systems measuring up to 3 mm in the lower pole
collecting system of the left kidney. No hydroureteronephrosis.
Urinary bladder is nearly completely decompressed, but otherwise
unremarkable in appearance. Bilateral adrenal glands are normal in
appearance.

Stomach/Bowel: Normal appearance of the stomach. No pathologic
dilatation of small bowel or colon. Severe mural thickening of the
rectum and colon where there is also some mucosal hyperenhancement
and some hypervascularity in the associated mesocolon, indicative of
severe colitis. The appendix is not confidently identified and may
be surgically absent. Regardless, there are no inflammatory changes
noted adjacent to the cecum to suggest the presence of an acute
appendicitis at this time.

Vascular/Lymphatic: Aortic atherosclerosis, without evidence of
aneurysm or dissection in the abdominal or pelvic vasculature. No
lymphadenopathy noted in the abdomen or pelvis.

Reproductive: Prostate gland and seminal vesicles are unremarkable
in appearance.

Other: Small infraumbilical ventral hernia containing only omental
fat (neck visualized on axial image 56 of series 2). No significant
volume of ascites. No pneumoperitoneum.

Musculoskeletal: Diffuse sclerotic changes in the visualized bones
with coarsened trabeculations, presumably reflective of renal
osteodystrophy. No definite aggressive appearing lytic or blastic
lesions are noted in the visualized portions of the skeleton.
IMPRESSION: 1. Findings compatible with severe colitis, as above.
2. Chronic dilatation of the common bile duct and pancreatic duct.
No frank intrahepatic biliary ductal dilatation. As previously
recommended, follow-up nonemergent abdominal MRI with and without IV
gadolinium with MRCP is strongly recommended in the near future to
exclude the possibility of an obstructing lesion in the ampulla or
pancreatic head.
3. Cholelithiasis without evidence of acute cholecystitis.
Additionally, no calcified choledocholithiasis is identified.
4. Multiple cystic lesions of varying degrees of complexity,
including some indeterminate lesions bilaterally. Attention at time
of forthcoming abdominal MRI with and without IV gadolinium with
MRCP is recommended to definitively characterize these lesions.
5. Small infraumbilical ventral hernia containing only omental fat.
No associated bowel incarceration or obstruction at this time.
6. Aortic atherosclerosis.
7. Cardiomegaly.
8. Additional incidental findings, as above.

## 2019-10-21 MED ORDER — ONDANSETRON HCL 4 MG/2ML IJ SOLN
4.0000 mg | Freq: Once | INTRAMUSCULAR | Status: AC
  Administered 2019-10-21: 4 mg via INTRAVENOUS
  Filled 2019-10-21: qty 2

## 2019-10-21 MED ORDER — LIDOCAINE-PRILOCAINE 2.5-2.5 % EX CREA: 1.0000 "application " | TOPICAL_CREAM | CUTANEOUS | Status: DC | PRN

## 2019-10-21 MED ORDER — CIPROFLOXACIN IN D5W 400 MG/200ML IV SOLN
400.0000 mg | Freq: Two times a day (BID) | INTRAVENOUS | Status: DC
  Administered 2019-10-21 – 2019-10-22 (×2): 400 mg via INTRAVENOUS
  Filled 2019-10-21 (×2): qty 200

## 2019-10-21 MED ORDER — CIPROFLOXACIN IN D5W 400 MG/200ML IV SOLN
400.0000 mg | Freq: Once | INTRAVENOUS | Status: AC
  Administered 2019-10-21: 400 mg via INTRAVENOUS
  Filled 2019-10-21: qty 200

## 2019-10-21 MED ORDER — ONDANSETRON HCL 4 MG/2ML IJ SOLN: 4.0000 mg | Freq: Four times a day (QID) | INTRAMUSCULAR | Status: DC | PRN

## 2019-10-21 MED ORDER — SODIUM CHLORIDE 0.9% IV SOLUTION: Freq: Once | INTRAVENOUS | Status: DC

## 2019-10-21 MED ORDER — FENTANYL CITRATE (PF) 100 MCG/2ML IJ SOLN
50.0000 ug | Freq: Once | INTRAMUSCULAR | Status: AC
  Administered 2019-10-21: 50 ug via INTRAVENOUS
  Filled 2019-10-21: qty 2

## 2019-10-21 MED ORDER — LIDOCAINE HCL (PF) 1 % IJ SOLN: 5.0000 mL | INTRAMUSCULAR | Status: DC | PRN

## 2019-10-21 MED ORDER — PENTAFLUOROPROP-TETRAFLUOROETH EX AERO: 1.0000 "application " | INHALATION_SPRAY | CUTANEOUS | Status: DC | PRN

## 2019-10-21 MED ORDER — ONDANSETRON HCL 4 MG PO TABS: 4.0000 mg | ORAL_TABLET | Freq: Four times a day (QID) | ORAL | Status: DC | PRN

## 2019-10-21 MED ORDER — ACETAMINOPHEN 325 MG PO TABS
650.0000 mg | ORAL_TABLET | Freq: Four times a day (QID) | ORAL | Status: DC | PRN
  Administered 2019-10-22: 650 mg via ORAL
  Filled 2019-10-21: qty 2

## 2019-10-21 MED ORDER — CHLORHEXIDINE GLUCONATE CLOTH 2 % EX PADS: 6.0000 | MEDICATED_PAD | Freq: Every day | CUTANEOUS | Status: DC

## 2019-10-21 MED ORDER — ALTEPLASE 2 MG IJ SOLR
2.0000 mg | Freq: Once | INTRAMUSCULAR | Status: DC | PRN
  Filled 2019-10-21: qty 2

## 2019-10-21 MED ORDER — HEPARIN SODIUM (PORCINE) 1000 UNIT/ML DIALYSIS
1000.0000 [IU] | INTRAMUSCULAR | Status: DC | PRN
  Filled 2019-10-21: qty 1

## 2019-10-21 MED ORDER — METRONIDAZOLE IN NACL 5-0.79 MG/ML-% IV SOLN
500.0000 mg | Freq: Three times a day (TID) | INTRAVENOUS | Status: DC
  Administered 2019-10-21 – 2019-10-22 (×3): 500 mg via INTRAVENOUS
  Filled 2019-10-21 (×3): qty 100

## 2019-10-21 MED ORDER — SODIUM CHLORIDE 0.9 % IV SOLN: 100.0000 mL | INTRAVENOUS | Status: DC | PRN

## 2019-10-21 MED ORDER — ACETAMINOPHEN 650 MG RE SUPP: 650.0000 mg | Freq: Four times a day (QID) | RECTAL | Status: DC | PRN

## 2019-10-21 MED ORDER — IOHEXOL 9 MG/ML PO SOLN
ORAL | Status: AC
  Filled 2019-10-21: qty 1000

## 2019-10-21 MED ORDER — METRONIDAZOLE IN NACL 5-0.79 MG/ML-% IV SOLN
500.0000 mg | Freq: Once | INTRAVENOUS | Status: AC
  Administered 2019-10-21: 500 mg via INTRAVENOUS
  Filled 2019-10-21: qty 100

## 2019-10-21 MED ORDER — IOHEXOL 300 MG/ML  SOLN
100.0000 mL | Freq: Once | INTRAMUSCULAR | Status: AC | PRN
  Administered 2019-10-21: 100 mL via INTRAVENOUS

## 2019-10-21 NOTE — H&P (Signed)
History and Physical    [name] IRJ:[id number] DOB: [birth year] DOA: 10/20/2019  PCP: Hilbert Corrigan, MD  Patient coming from: [facility] skilled nursing facility  I have personally briefly reviewed patient's old medical records in [place]  Chief Complaint: Abdominal pain with diarrhea  HPI: [name] is a 69 y.o. female with medical history significant of end-stage renal disease on hemodialysis, HIV, hypertension, chronic pain syndrome, is a resident of a skilled nursing facility.  She presents to the hospital with 2-3 day history of abdominal pain and diarrhea.  Patient reports onset of symptoms after she had macaroni and cheese approximately 3 days ago.  She began having diffuse abdominal pain and frequent bowel movements.  Initially, she noted some dark blood in her stool, but that has since resolved.  She has been feverish.  She has had nausea, but no vomiting.  She said no shortness of breath, cough.  Due to her GI illness, she was unable to attend her last dialysis session on Thursday.  She is brought to the ER for evaluation today.  ED Course: Vitals noted to be stable.  Hemoglobin noted to be low at 6.8.  Chemistry unrevealing.  CT of the abdomen pelvis indicated severe colitis.  She has been referred for admission.  Review of Systems:  Review of Systems  Constitutional: Positive for chills, diaphoresis and fever.  Eyes: Negative for blurred vision and double vision.  Respiratory: Negative for cough and shortness of breath.   Cardiovascular: Negative for chest pain.  Gastrointestinal: Positive for abdominal pain, blood in stool, diarrhea and nausea. Negative for vomiting.  Neurological: Negative for dizziness.       Past Medical History:  Diagnosis Date  . Anemia   . Anxiety   . Atrial flutter ([place]) 02/22/2015  . C. difficile colitis 10/10/11  . Chronic diarrhea   . Depression   . ESRD on hemodialysis ([place])   . GERD (gastroesophageal  reflux disease)   . HIV (human immunodeficiency virus infection) ([place])   . Hypertension   . Insomnia   . Intracranial hemorrhage ([place])   . Muscle weakness (generalized)   . Pulmonary HTN ([place])   . Tachycardia   . TIA (transient ischemic attack)   . Traumatic hematoma of right upper arm 02/22/2015    Past Surgical History:  Procedure Laterality Date  . A/V FISTULAGRAM N/A 04/17/2016   Procedure: A/V Fistulagram - Right Upper;  Surgeon: Waynetta Sandy, MD;  Location: [place] [facility];  Service: Cardiovascular;  Laterality: N/A;  . A/V FISTULAGRAM Right 08/28/2019   Procedure: A/V FISTULAGRAM;  Surgeon: Waynetta Sandy, MD;  Location: [facility];  Service: Cardiovascular;  Laterality: Right;  . BIOPSY THYROID    . Dialysis Shunts     previous one removed from left arm and now present one in right arm  . DIALYSIS/PERMA CATHETER INSERTION Right 04/17/2016   Procedure: dialysis Catheter Insertion central veinous;  Surgeon: Waynetta Sandy, MD;  Location: [place] [facility];  Service: Cardiovascular;  Laterality: Right;  . ESOPHAGOGASTRODUODENOSCOPY  12/15/2010   [place]: erosive reflux esophagitis, MW tear, hiatal hernia (small), gastritis with no h.pylori, possibly nsaid related  . EXCISION OF BREAST BIOPSY Right 05/04/2012   Procedure: EXCISION OF BREAST BIOPSY;  Surgeon: Donato Heinz, MD;  Location: [facility];  Service: General;  Laterality: Right;  Right Excisional Breast Biopsy  . FISTULOGRAM Right 03/26/2014   Procedure: Right Arm Fistulogram with Venoplasty Right Subclavian  Vein and Inominate Vein. Debridement Fistula Ulcer;  Surgeon: Conrad [place], MD;  Location: [place];  Service: Vascular;  Laterality: Right;  . FISTULOGRAM Right 03/29/2017   Procedure: FISTULOGRAM COMPLEX RIGHT ARM with Balloon angioplasty;  Surgeon: Waynetta Sandy, MD;  Location: [place];  Service: Vascular;  Laterality: Right;  . IR GENERIC HISTORICAL  05/19/2016   IR REMOVAL  TUN CV CATH W/O FL 05/19/2016 Markus Daft, MD [facility]-INTERV RAD  . PERIPHERAL VASCULAR BALLOON ANGIOPLASTY Right 04/17/2016   Procedure: Peripheral Vascular Balloon Angioplasty;  Surgeon: Waynetta Sandy, MD;  Location: [place] [facility];  Service: Cardiovascular;  Laterality: Right;  Central segment AV Fistula  . PERIPHERAL VASCULAR BALLOON ANGIOPLASTY Right 03/14/2018   Procedure: PERIPHERAL VASCULAR BALLOON ANGIOPLASTY;  Surgeon: Waynetta Sandy, MD;  Location: [place] [facility];  Service: Cardiovascular;  Laterality: Right;  subclavian vein  . PERIPHERAL VASCULAR BALLOON ANGIOPLASTY Right 08/28/2019   Procedure: PERIPHERAL VASCULAR BALLOON ANGIOPLASTY;  Surgeon: Waynetta Sandy, MD;  Location: [place] [facility];  Service: Cardiovascular;  Laterality: Right;  upper arm  . PERIPHERAL VASCULAR INTERVENTION Right 03/14/2018   Procedure: PERIPHERAL VASCULAR INTERVENTION;  Surgeon: Waynetta Sandy, MD;  Location: [place] [facility];  Service: Cardiovascular;  Laterality: Right;  subclavian vein  . SHUNTOGRAM Right 10/19/2013   Procedure: FISTULOGRAM;  Surgeon: Conrad [place], MD;  Location: [facility] C[facility];  Service: Cardiovascular;  Laterality: Right;  . TONSILLECTOMY    . VISCERAL VENOGRAPHY Right 03/14/2018   Procedure: CENTRAL VENOGRAPHY;  Surgeon: Waynetta Sandy, MD;  Location: [place] [facility];  Service: Cardiovascular;  Laterality: Right;  upper ext fistula    Social History:  reports that she quit smoking about 36 years ago. She has never used smokeless tobacco. She reports that she does not drink alcohol and does not use drugs.  Allergies  Allergen Reactions  . Lactose Intolerance (Gi) Other (See Comments)    On MAR  . Latex Rash and Itching  . Penicillins Rash and Swelling    Has patient had a PCN reaction causing immediate rash, facial/tongue/throat swelling, SOB or lightheadedness with hypotension: No Has patient had a PCN reaction causing  severe rash involving mucus membranes or skin necrosis: No Has patient had a PCN reaction that required hospitalization No Has patient had a PCN reaction occurring within the last 10 years: No If all of the above answers are "NO", then may proceed with Cephalosporin use.      Family History  Problem Relation Age of Onset  . Anesthesia problems Neg Hx   . Hypotension Neg Hx   . Malignant hyperthermia Neg Hx   . Pseudochol deficiency Neg Hx     Prior to Admission medications   Medication Sig Start Date End Date Taking? Authorizing Provider  acetaminophen (TYLENOL) 650 MG CR tablet Take 650 mg by mouth every 8 (eight) hours as needed for pain.  Patient not taking: Reported on 09/29/2019    [provider]  Amino Acids-Protein Hydrolys (FEEDING SUPPLEMENT, PRO-STAT SUGAR FREE 64,) LIQD Take 30 mLs by mouth daily. (WOUND HEALING) (0600)    [provider]  Biotin 5000 MCG CAPS Take 5,000 mcg by mouth daily. (0800)    [provider]  calcium acetate, Phos Binder, (PHOSLYRA) 667 MG/5ML SOLN Take 2,001 mg by mouth 3 (three) times daily with meals. (0800, 1200, & 2000)    [provider]  calcium carbonate (TUMS - DOSED IN MG ELEMENTAL CALCIUM) 500 MG  chewable tablet Chew 2 tablets by mouth in the morning and at bedtime. (0800 & 2100)    [provider]  clopidogrel (PLAVIX) 75 MG tablet Take 1 tablet (75 mg total) by mouth daily. Patient taking differently: Take 75 mg by mouth daily. (0800) 03/29/17   Waynetta Sandy, MD  dolutegravir (TIVICAY) 50 MG tablet Take 50 mg by mouth daily at 4 PM. (1600)    [provider]  escitalopram (LEXAPRO) 10 MG tablet Take 10 mg by mouth daily.    [provider]  fentaNYL (DURAGESIC) 50 MCG/HR Place 1 patch onto the skin every 3 (three) days.    [provider]  lamivudine (EPIVIR) 100 MG tablet Take 50 mg by mouth daily. (0600)    [provider]  lanolin/mineral oil  (KERI/THERA-DERM) LOTN Apply 1 application topically once a week.    [provider]  Lidocaine HCl (ASPERCREME W/LIDOCAINE) 4 % CREA Apply 1 application topically 2 (two) times daily as needed (to bilateral wrists for pain).     [provider]  methadone (DOLOPHINE) 5 MG tablet Take 2.5 mg by mouth daily. (2000) 08/02/19   [provider]  mirtazapine (REMERON) 15 MG tablet Take 15 mg by mouth at bedtime. (2000)    [provider]  Multiple Vitamin (MULTIVITAMIN WITH MINERALS) TABS tablet Take 1 tablet by mouth at bedtime. (2000)    [provider]  nitroGLYCERIN (NITROSTAT) 0.4 MG SL tablet Place 0.4 mg under the tongue every 5 (five) minutes as needed for chest pain.     [provider]  omeprazole (PRILOSEC) 20 MG capsule Take 40 mg by mouth daily at 6 (six) AM. (0600)    [provider]  ondansetron (ZOFRAN-ODT) 4 MG disintegrating tablet Take 4 mg by mouth every 8 (eight) hours as needed for nausea or vomiting. Patient not taking: Reported on 09/29/2019    [provider]  Oxycodone HCl 10 MG TABS Take 1 tablet (10 mg total) by mouth every 12 (twelve) hours as needed (severe pain). 07/22/19   Orson Eva, MD  OXYGEN Inhale 2 L into the lungs as needed.    [provider]  promethazine (PHENERGAN) 12.5 MG tablet Take 12.5 mg by mouth Every Tuesday,Thursday,and Saturday with dialysis. May take an additional 12.5 mg dose every 6 hours as needed for nausea    [provider]  sertraline (ZOLOFT) 100 MG tablet Take 200 mg by mouth daily. (0900)    [provider]  sertraline (ZOLOFT) 25 MG tablet Take 100 mg by mouth at bedtime. (2000)    [provider]  traZODone (DESYREL) 50 MG tablet Take 50 mg by mouth at bedtime. (2100)    [provider]  trolamine salicylate (ASPERCREME) 10 % cream Apply 1 application topically as needed for muscle pain.    [provider]  vitamin B-12  (CYANOCOBALAMIN) 100 MCG tablet Take 100 mcg by mouth daily. (0800)    [provider]  vitamin C (ASCORBIC ACID) 500 MG tablet Take 500 mg by mouth 2 (two) times daily. (0900 & 2100)    [provider]  zidovudine (RETROVIR) 100 MG capsule Take 100 mg by mouth 3 (three) times daily. (0900, 1300, & 2100)    [provider]  Zinc 220 (50 Zn) MG CAPS Take 220 mg by mouth daily. (0800)    [provider]    Physical Exam: Vitals:   10/21/19 1710 10/21/19 1800 10/21/19 1815 10/21/19 1830  BP: (!) 131/93 140/78 (!) 142/80 132/72  Pulse: 66 65 65 68  Resp: 18 16 18    Temp: 97.9 F (36.6 C) 98.2 F (36.8 C) 98.2 F (36.8 C)   TempSrc:  Oral Oral   SpO2: 100% 100% 99%   Weight: 49.5 kg 49.5 kg    Height: 4\' 10"  (1.473 m)       Constitutional: NAD, calm, comfortable Eyes: PERRL, lids and conjunctivae normal ENMT: Mucous membranes are moist. Posterior pharynx clear of any exudate or lesions.Normal dentition.  Neck: normal, supple, no masses, no thyromegaly Respiratory: clear to auscultation bilaterally, no wheezing, no crackles. Normal respiratory effort. No accessory muscle use.  Cardiovascular: Regular rate and rhythm, no murmurs / rubs / gallops. No extremity edema. 2+ pedal pulses. No carotid bruits.  Abdomen: Soft, diffusely tender, no masses palpated. No hepatosplenomegaly. Bowel sounds positive.  Musculoskeletal: no clubbing / cyanosis. No joint deformity upper and lower extremities. Good ROM, no contractures. Normal muscle tone.  Skin: no rashes, lesions, ulcers. No induration Neurologic: CN 2-12 grossly intact. Sensation intact, DTR normal. Strength 5/5 in all 4.  Psychiatric: Normal judgment and insight. Alert and oriented x 3. Normal mood.    Labs on Admission: I have personally reviewed following labs and imaging studies  CBC: Recent Labs  Lab 10/18/19 0941 10/20/19 2330  WBC 3.2* 5.1  NEUTROABS 2.2  --   HGB 7.1* 6.8*  HCT 24.5*  23.0*  MCV 114.0* 113.9*  PLT 144* 093   Basic Metabolic Panel: Recent Labs  Lab 10/18/19 0941 10/20/19 2330  NA 139 136  K 3.8 4.0  CL 99 97*  CO2 28 24  GLUCOSE 94 92  BUN 32* 52*  CREATININE 6.15* 10.99*  CALCIUM 9.5 9.2   GFR: Estimated Creatinine Clearance: 3.5 mL/min (A) (by C-G formula based on SCr of 10.99 mg/dL (H)). Liver Function Tests: Recent Labs  Lab 10/18/19 0941 10/20/19 2330  AST 15 13*  ALT 9 8  ALKPHOS 139* 131*  BILITOT 0.4 0.6  PROT 8.1 7.9  ALBUMIN 3.3* 3.3*   Recent Labs  Lab 10/20/19 2330  LIPASE 51   No results for input(s): AMMONIA in the last 168 hours. Coagulation Profile: No results for input(s): INR, PROTIME in the last 168 hours. Cardiac Enzymes: No results for input(s): CKTOTAL, CKMB, CKMBINDEX, TROPONINI in the last 168 hours. BNP (last 3 results) No results for input(s): PROBNP in the last 8760 hours. HbA1C: No results for input(s): HGBA1C in the last 72 hours. CBG: No results for input(s): GLUCAP in the last 168 hours. Lipid Profile: No results for input(s): CHOL, HDL, LDLCALC, TRIG, CHOLHDL, LDLDIRECT in the last 72 hours. Thyroid Function Tests: No results for input(s): TSH, T4TOTAL, FREET4, T3FREE, THYROIDAB in the last 72 hours. Anemia Panel: No results for input(s): VITAMINB12, FOLATE, FERRITIN, TIBC, IRON, RETICCTPCT in the last 72 hours. Urine analysis:    Component Value Date/Time   COLORURINE YELLOW 01/19/2009 1855   APPEARANCEUR CLEAR 01/19/2009 1855   LABSPEC 1.015 01/19/2009 1855   PHURINE 7.5 01/19/2009 1855   GLUCOSEU NEGATIVE 01/19/2009 1855   HGBUR SMALL (A) 01/19/2009 1855   BILIRUBINUR NEGATIVE 01/19/2009 1855   KETONESUR NEGATIVE 01/19/2009 1855   PROTEINUR 100 (A) 01/19/2009 1855   UROBILINOGEN 0.2 01/19/2009 1855   NITRITE NEGATIVE 01/19/2009 1855   LEUKOCYTESUR NEGATIVE 01/19/2009 1855    Radiological Exams on Admission: CT Abdomen Pelvis W Contrast  Result Date: 10/21/2019 CLINICAL  DATA:  68 year old female with history of abdominal  pain. Suspected abdominal hernia. EXAM: CT ABDOMEN AND PELVIS WITH CONTRAST TECHNIQUE: Multidetector CT imaging of the abdomen and pelvis was performed using the standard protocol following bolus administration of intravenous contrast. CONTRAST:  122mL OMNIPAQUE IOHEXOL 300 MG/ML  SOLN COMPARISON:  CT the abdomen and pelvis 07/19/2019. FINDINGS: Lower chest: Cardiomegaly. Hepatobiliary: No suspicious cystic or solid hepatic lesions. No intrahepatic biliary ductal dilatation. 5 mm calcified gallstone lying dependently in the gallbladder. Common bile duct is dilated measuring 14 mm in the porta hepatis, similar to the prior examination. No calcified stone identified in the common bile duct. No findings to suggest an acute cholecystitis at this time. Pancreas: No pancreatic mass. Persistent pancreatic ductal dilatation measuring up to 7 mm in the pancreatic head, similar to the prior study from a 07/19/2019. No pancreatic or peripancreatic fluid collections or inflammatory changes. Spleen: Unremarkable. Adrenals/Urinary Tract: Severe atrophy of the kidneys bilaterally. Numerous small renal lesions bilaterally ranging from low to high attenuation, many of which are subcentimeter in size and too small to characterize, but favored to represent cysts and proteinaceous/hemorrhagic cyst. Several indeterminate lesions are also noted, most notable for a 1.4 cm lesion measuring 59 HU in the lateral aspect of the interpolar region of the right kidney (axial image 23 of series 2). Multiple tiny calcifications are present in both renal collecting systems measuring up to 3 mm in the lower pole collecting system of the left kidney. No hydroureteronephrosis. Urinary bladder is nearly completely decompressed, but otherwise unremarkable in appearance. Bilateral adrenal glands are normal in appearance. Stomach/Bowel: Normal appearance of the stomach. No pathologic dilatation of small  bowel or colon. Severe mural thickening of the rectum and colon where there is also some mucosal hyperenhancement and some hypervascularity in the associated mesocolon, indicative of severe colitis. The appendix is not confidently identified and may be surgically absent. Regardless, there are no inflammatory changes noted adjacent to the cecum to suggest the presence of an acute appendicitis at this time. Vascular/Lymphatic: Aortic atherosclerosis, without evidence of aneurysm or dissection in the abdominal or pelvic vasculature. No lymphadenopathy noted in the abdomen or pelvis. Reproductive: Prostate gland and seminal vesicles are unremarkable in appearance. Other: Small infraumbilical ventral hernia containing only omental fat (neck visualized on axial image 56 of series 2). No significant volume of ascites. No pneumoperitoneum. Musculoskeletal: Diffuse sclerotic changes in the visualized bones with coarsened trabeculations, presumably reflective of renal osteodystrophy. No definite aggressive appearing lytic or blastic lesions are noted in the visualized portions of the skeleton. IMPRESSION: 1. Findings compatible with severe colitis, as above. 2. Chronic dilatation of the common bile duct and pancreatic duct. No frank intrahepatic biliary ductal dilatation. As previously recommended, follow-up nonemergent abdominal MRI with and without IV gadolinium with MRCP is strongly recommended in the near future to exclude the possibility of an obstructing lesion in the ampulla or pancreatic head. 3. Cholelithiasis without evidence of acute cholecystitis. Additionally, no calcified choledocholithiasis is identified. 4. Multiple cystic lesions of varying degrees of complexity, including some indeterminate lesions bilaterally. Attention at time of forthcoming abdominal MRI with and without IV gadolinium with MRCP is recommended to definitively characterize these lesions. 5. Small infraumbilical ventral hernia containing  only omental fat. No associated bowel incarceration or obstruction at this time. 6. Aortic atherosclerosis. 7. Cardiomegaly. 8. Additional incidental findings, as above. Electronically Signed   By: Vinnie Langton M.D.   On: 10/21/2019 04:35      Assessment/Plan Active Problems:   Human immunodeficiency virus (HIV) disease ([place])  ESRD on dialysis ([place])   Anemia of chronic disease   Colitis   Dilation of common bile duct     1. Acute colitis.  Patient noted to have severe colitis on CT imaging.  She is presented with frequent stools, abdominal pain.  Check stool studies including C. difficile and GI pathogen panel.  She had an admission in 07/2019 for C. difficile colitis.  Is also a resident of a nursing facility.  Currently on ciprofloxacin and Flagyl.  Start on clear liquid diet. 2. End-stage renal disease on hemodialysis.  Nephrology consulted. 3. Anemia of chronic disease.  Likely related to chronic kidney disease.  She also may have had some blood loss related to colitis.  She is being transfused 1 unit of PRBC for hemoglobin of 6.8. 4. HIV.  On antiretroviral therapy.  Resume once medications reconciled by pharmacy. 5. Chronic pain syndrome.  Resume chronic pain medication regimen once reconciled by pharmacy. 6. Dilated common bile duct.  Appears to be a chronic finding.  Consider MRCP versus CT abdomen with pancreatic protocol.  DVT prophylaxis: SCD  Code Status: full code  Family Communication: discussed with patient  Disposition Plan: return to SNF once colitis symptoms have improved  Consults called: nephrology  Admission status: inpatient, telemetry   Kathie Dike MD [facility]   If 7PM-7AM, please contact night-coverage [email or web address]   10/21/2019, 7:25 PM

## 2019-10-21 NOTE — ED Notes (Signed)
Pt in cT at this time.

## 2019-10-21 NOTE — ED Provider Notes (Addendum)
[facility] EMERGENCY DEPARTMENT Provider Note   CSN: [id number] Arrival date & time: 10/20/19  2237   Time seen 12:23 AM  History Chief Complaint  Patient presents with  . Abdominal Pain    [name] is a 69 y.o. female.  HPI   When I go in and asked the patient what is going on she states "I feel terrible".  She states "I'm a 10".  However I asked her to explain to me how she is not feeling well.  She states "my behind is real".  She then goes on to tell me she has had diarrhea that started on August 19.  She states she is having about 5 episodes a day.  She states the first 1 was runny and then the diarrhea is "like sand".  She also states she has a hernia on her right abdomen and it started hurting off and on today.  She states her abdominal pain is cramping.  She had nausea and vomiting once yesterday on the way to dialysis but none today.  She also states when she coughs it puts pressure on her right flank kidney area.  She goes on to say she has not felt well since they told her she was anemic.  Patient has end-stage renal disease and gets dialysis on Tuesday, Thursday, and Saturday (August 21)  PCP Hilbert Corrigan, MD   Past Medical History:  Diagnosis Date  . Anemia   . Anxiety   . Atrial flutter ([place]) 02/22/2015  . C. difficile colitis 10/10/11  . Chronic diarrhea   . Depression   . ESRD on hemodialysis ([place])   . GERD (gastroesophageal reflux disease)   . HIV (human immunodeficiency virus infection) ([place])   . Hypertension   . Insomnia   . Intracranial hemorrhage ([place])   . Muscle weakness (generalized)   . Pulmonary HTN ([place])   . Tachycardia   . TIA (transient ischemic attack)   . Traumatic hematoma of right upper arm 02/22/2015    Patient Active Problem List   Diagnosis Date Noted  . Common bile duct dilation   . Lobar pneumonia ([place]) 07/20/2019  . Acute respiratory failure with hypoxia ([place]) 07/20/2019  . Colitis 07/19/2019  . Dilation of  common bile duct 07/19/2019  . Ventral hernia without obstruction or gangrene   . Hypocalcemia 03/07/2019  . Other pancytopenia ([place]) 12/08/2018  . Anemia of chronic disease 04/17/2016  . Hepatitis B immune 01/28/2016  . Atrial flutter ([place]) 02/22/2015  . Hyperkalemia 02/20/2015  . Venous stenosis of right upper extremity 04/06/2014  . ESRD on dialysis ([place]) 04/06/2014  . Essential hypertension   . Nausea with vomiting 12/15/2012  . CHF, chronic ([place]) 11/25/2012  . Atrial fibrillation ([place]) 05/02/2012  . Intracranial bleed ([place]) 12/28/2011  . Angioedema of lips 09/21/2011  . Cellulitis of breast 03/09/2011  . Erosive esophagitis 12/15/2010  . Mallory - Weiss tear 12/15/2010  . UGI bleed 12/13/2010  . DIARRHEA 04/12/2009  . Human immunodeficiency virus (HIV) disease ([place]) 11/15/2008  . PANCREATITIS, HX OF 11/15/2008  . TOBACCO USE, QUIT 11/15/2008  . PAIN IN JOINT, UPPER ARM 08/07/2008  . ABDOMINAL WALL HERNIA 04/26/2007  . BRONCHITIS, ACUTE 11/16/2006  . HYPOKALEMIA, HX OF 07/13/2006  . DEPRESSION 03/23/2006  . Essential hypertension, malignant 03/23/2006  . GERD 03/23/2006  . PANCREATITIS 03/23/2006  . MASS, RIGHT AXILLA 03/23/2006  . HEADACHE 03/23/2006  . SYMPTOM, ENLARGEMENT, LYMPH NODES 03/23/2006  . SHINGLES, HX OF 03/23/2006  .  HYSTERECTOMY, TOTAL, HX OF 03/23/2006    Past Surgical History:  Procedure Laterality Date  . A/V FISTULAGRAM N/A 04/17/2016   Procedure: A/V Fistulagram - Right Upper;  Surgeon: Waynetta Sandy, MD;  Location: [place] [facility];  Service: Cardiovascular;  Laterality: N/A;  . A/V FISTULAGRAM Right 08/28/2019   Procedure: A/V FISTULAGRAM;  Surgeon: Waynetta Sandy, MD;  Location: [place] [facility];  Service: Cardiovascular;  Laterality: Right;  . BIOPSY THYROID    . Dialysis Shunts     previous one removed from left arm and now present one in right arm  . DIALYSIS/PERMA CATHETER INSERTION Right 04/17/2016   Procedure:  dialysis Catheter Insertion central veinous;  Surgeon: Waynetta Sandy, MD;  Location: [place] [facility];  Service: Cardiovascular;  Laterality: Right;  . ESOPHAGOGASTRODUODENOSCOPY  12/15/2010   Rourk: erosive reflux esophagitis, MW tear, hiatal hernia (small), gastritis with no h.pylori, possibly nsaid related  . EXCISION OF BREAST BIOPSY Right 05/04/2012   Procedure: EXCISION OF BREAST BIOPSY;  Surgeon: Donato Heinz, MD;  Location: [facility];  Service: General;  Laterality: Right;  Right Excisional Breast Biopsy  . FISTULOGRAM Right 03/26/2014   Procedure: Right Arm Fistulogram with Venoplasty Right Subclavian Vein and Inominate Vein. Debridement Fistula Ulcer;  Surgeon: Conrad [place], MD;  Location: [place];  Service: Vascular;  Laterality: Right;  . FISTULOGRAM Right 03/29/2017   Procedure: FISTULOGRAM COMPLEX RIGHT ARM with Balloon angioplasty;  Surgeon: Waynetta Sandy, MD;  Location: [place];  Service: Vascular;  Laterality: Right;  . IR GENERIC HISTORICAL  05/19/2016   IR REMOVAL TUN CV CATH W/O FL 05/19/2016 Markus Daft, MD [facility]-INTERV RAD  . PERIPHERAL VASCULAR BALLOON ANGIOPLASTY Right 04/17/2016   Procedure: Peripheral Vascular Balloon Angioplasty;  Surgeon: Waynetta Sandy, MD;  Location: [place] [facility];  Service: Cardiovascular;  Laterality: Right;  Central segment AV Fistula  . PERIPHERAL VASCULAR BALLOON ANGIOPLASTY Right 03/14/2018   Procedure: PERIPHERAL VASCULAR BALLOON ANGIOPLASTY;  Surgeon: Waynetta Sandy, MD;  Location: [place] [facility];  Service: Cardiovascular;  Laterality: Right;  subclavian vein  . PERIPHERAL VASCULAR BALLOON ANGIOPLASTY Right 08/28/2019   Procedure: PERIPHERAL VASCULAR BALLOON ANGIOPLASTY;  Surgeon: Waynetta Sandy, MD;  Location: [place] [facility];  Service: Cardiovascular;  Laterality: Right;  upper arm  . PERIPHERAL VASCULAR INTERVENTION Right 03/14/2018   Procedure: PERIPHERAL VASCULAR INTERVENTION;  Surgeon:  Waynetta Sandy, MD;  Location: [place] [facility];  Service: Cardiovascular;  Laterality: Right;  subclavian vein  . SHUNTOGRAM Right 10/19/2013   Procedure: FISTULOGRAM;  Surgeon: Conrad [place], MD;  Location: [facility] C[facility] LAB;  Service: Cardiovascular;  Laterality: Right;  . TONSILLECTOMY    . VISCERAL VENOGRAPHY Right 03/14/2018   Procedure: CENTRAL VENOGRAPHY;  Surgeon: Waynetta Sandy, MD;  Location: [place] [facility];  Service: Cardiovascular;  Laterality: Right;  upper ext fistula     OB History    Gravida  1   Para  1   Term  1   Preterm      AB      Living  1     SAB      TAB      Ectopic      Multiple      Live Births              Family History  Problem Relation Age of Onset  . Anesthesia problems Neg Hx   . Hypotension Neg Hx   .  Malignant hyperthermia Neg Hx   . Pseudochol deficiency Neg Hx     Social History   Tobacco Use  . Smoking status: Former Smoker    Quit date: 03/12/1983    Years since quitting: 36.6  . Smokeless tobacco: Never Used  Vaping Use  . Vaping Use: Never used  Substance Use Topics  . Alcohol use: No    Alcohol/week: 0.0 standard drinks  . Drug use: No  lives in a facility  Home Medications Prior to Admission medications   Medication Sig Start Date End Date Taking? Authorizing Provider  acetaminophen (TYLENOL) 650 MG CR tablet Take 650 mg by mouth every 8 (eight) hours as needed for pain.  Patient not taking: Reported on 09/29/2019    [provider]  Amino Acids-Protein Hydrolys (FEEDING SUPPLEMENT, PRO-STAT SUGAR FREE 64,) LIQD Take 30 mLs by mouth daily. (WOUND HEALING) (0600)    [provider]  Biotin 5000 MCG CAPS Take 5,000 mcg by mouth daily. (0800)    [provider]  calcium acetate, Phos Binder, (PHOSLYRA) 667 MG/5ML SOLN Take 2,001 mg by mouth 3 (three) times daily with meals. (0800, 1200, & 2000)    [provider]  calcium carbonate (TUMS - DOSED IN MG  ELEMENTAL CALCIUM) 500 MG chewable tablet Chew 2 tablets by mouth in the morning and at bedtime. (0800 & 2100)    [provider]  clopidogrel (PLAVIX) 75 MG tablet Take 1 tablet (75 mg total) by mouth daily. Patient taking differently: Take 75 mg by mouth daily. (0800) 03/29/17   Waynetta Sandy, MD  dolutegravir (TIVICAY) 50 MG tablet Take 50 mg by mouth daily at 4 PM. (1600)    [provider]  escitalopram (LEXAPRO) 10 MG tablet Take 10 mg by mouth daily.    [provider]  fentaNYL (DURAGESIC) 50 MCG/HR Place 1 patch onto the skin every 3 (three) days.    [provider]  lamivudine (EPIVIR) 100 MG tablet Take 50 mg by mouth daily. (0600)    [provider]  lanolin/mineral oil (KERI/THERA-DERM) LOTN Apply 1 application topically once a week.    [provider]  Lidocaine HCl (ASPERCREME W/LIDOCAINE) 4 % CREA Apply 1 application topically 2 (two) times daily as needed (to bilateral wrists for pain).     [provider]  methadone (DOLOPHINE) 5 MG tablet Take 2.5 mg by mouth daily. (2000) 08/02/19   [provider]  mirtazapine (REMERON) 15 MG tablet Take 15 mg by mouth at bedtime. (2000)    [provider]  Multiple Vitamin (MULTIVITAMIN WITH MINERALS) TABS tablet Take 1 tablet by mouth at bedtime. (2000)    [provider]  nitroGLYCERIN (NITROSTAT) 0.4 MG SL tablet Place 0.4 mg under the tongue every 5 (five) minutes as needed for chest pain.     [provider]  omeprazole (PRILOSEC) 20 MG capsule Take 40 mg by mouth daily at 6 (six) AM. (0600)    [provider]  ondansetron (ZOFRAN-ODT) 4 MG disintegrating tablet Take 4 mg by mouth every 8 (eight) hours as needed for nausea or vomiting. Patient not taking: Reported on 09/29/2019    [provider]  Oxycodone HCl 10 MG TABS Take 1 tablet (10 mg total) by mouth every 12 (twelve) hours as needed (severe pain). 07/22/19    Orson Eva, MD  OXYGEN Inhale 2 L into the lungs as needed.    [provider]  promethazine (PHENERGAN) 12.5 MG tablet Take  12.5 mg by mouth Every Tuesday,Thursday,and Saturday with dialysis. May take an additional 12.5 mg dose every 6 hours as needed for nausea    [provider]  sertraline (ZOLOFT) 100 MG tablet Take 200 mg by mouth daily. (0900)    [provider]  sertraline (ZOLOFT) 25 MG tablet Take 100 mg by mouth at bedtime. (2000)    [provider]  traZODone (DESYREL) 50 MG tablet Take 50 mg by mouth at bedtime. (2100)    [provider]  trolamine salicylate (ASPERCREME) 10 % cream Apply 1 application topically as needed for muscle pain.    [provider]  vitamin B-12 (CYANOCOBALAMIN) 100 MCG tablet Take 100 mcg by mouth daily. (0800)    [provider]  vitamin C (ASCORBIC ACID) 500 MG tablet Take 500 mg by mouth 2 (two) times daily. (0900 & 2100)    [provider]  zidovudine (RETROVIR) 100 MG capsule Take 100 mg by mouth 3 (three) times daily. (0900, 1300, & 2100)    [provider]  Zinc 220 (50 Zn) MG CAPS Take 220 mg by mouth daily. (0800)    [provider]    Allergies    Lactose intolerance (gi), Latex, and Penicillins  Review of Systems   Review of Systems  All other systems reviewed and are negative.   Physical Exam Updated Vital Signs BP 112/64   Pulse 75   Temp 99 F (37.2 C) (Oral)   Resp 18   Ht 4\' 10"  (1.473 m)   Wt 45 kg   SpO2 100%   BMI 20.73 kg/m   Physical Exam Vitals and nursing note reviewed.  Constitutional:      General: She is not in acute distress.    Appearance: Normal appearance. She is normal weight.  HENT:     Head: Normocephalic and atraumatic.     Right Ear: External ear normal.     Left Ear: External ear normal.  Eyes:     Extraocular Movements: Extraocular movements intact.     Conjunctiva/sclera: Conjunctivae normal.    Cardiovascular:     Rate and Rhythm: Normal rate and regular rhythm.     Heart sounds: Gallop present.   Pulmonary:     Effort: Pulmonary effort is normal. No respiratory distress.     Breath sounds: Normal breath sounds.  Abdominal:     Palpations: Abdomen is soft. There is mass.     Tenderness: There is abdominal tenderness.     Comments: Patient has a small apple sized hernia just to the right of her surgical scar in her lower mid abdomen that is very firm to touch.  When I press on it it does not reduce easily.  That appears to be the source of her pain.  She states normally her hernia does not feel that hard.  Musculoskeletal:        General: No swelling. Normal range of motion.     Cervical back: Normal range of motion.     Comments: Patient has AV fistula in her right forearm with bruit and thrill.  She has an old one on the left is not being used.  She has large dilatations present.  Skin:    General: Skin is warm and dry.  Neurological:     General: No focal deficit present.     Mental Status: She is alert and oriented to person, place, and time.     Cranial Nerves: No cranial nerve deficit.  Psychiatric:        Mood and Affect: Mood normal.        Behavior: Behavior normal.        Thought Content: Thought content normal.     ED Results / Procedures / Treatments   Labs (all labs ordered are listed, but only abnormal results are displayed) Results for orders placed or performed during the hospital encounter of 10/20/19  Lipase, blood  Result Value Ref Range   Lipase 51 11 - 51 U/L  Comprehensive metabolic panel  Result Value Ref Range   Sodium 136 135 - 145 mmol/L   Potassium 4.0 3.5 - 5.1 mmol/L   Chloride 97 (L) 98 - 111 mmol/L   CO2 24 22 - 32 mmol/L   Glucose, Bld 92 70 - 99 mg/dL   BUN 52 (H) 8 - 23 mg/dL   Creatinine, Ser 10.99 (H) 0.44 - 1.00 mg/dL   Calcium 9.2 8.9 - 10.3 mg/dL   Total Protein 7.9 6.5 - 8.1 g/dL   Albumin 3.3 (L) 3.5 - 5.0 g/dL   AST 13  (L) 15 - 41 U/L   ALT 8 0 - 44 U/L   Alkaline Phosphatase 131 (H) 38 - 126 U/L   Total Bilirubin 0.6 0.3 - 1.2 mg/dL   GFR calc non Af Amer 3 (L) >60 mL/min   GFR calc Af Amer 4 (L) >60 mL/min   Anion gap 15 5 - 15  CBC  Result Value Ref Range   WBC 5.1 4.0 - 10.5 K/uL   RBC 2.02 (L) 3.87 - 5.11 MIL/uL   Hemoglobin 6.8 (LL) 12.0 - 15.0 g/dL   HCT 23.0 (L) 36 - 46 %   MCV 113.9 (H) 80.0 - 100.0 fL   MCH 33.7 26.0 - 34.0 pg   MCHC 29.6 (L) 30.0 - 36.0 g/dL   RDW 17.1 (H) 11.5 - 15.5 %   Platelets 168 150 - 400 K/uL   nRBC 0.0 0.0 - 0.2 %   Laboratory interpretation all normal except stable anemia, chronic renal failure without hyperkalemia or metabolic acidosis.    EKG None  Radiology CT Abdomen Pelvis W Contrast  Result Date: 10/21/2019 CLINICAL DATA:  69 year old female with history of abdominal pain. Suspected abdominal hernia. EXAM: CT ABDOMEN AND PELVIS WITH CONTRAST TECHNIQUE: Multidetector CT imaging of the abdomen and pelvis was performed using the standard protocol following bolus administration of intravenous contrast. CONTRAST:  136mL OMNIPAQUE IOHEXOL 300 MG/ML  SOLN COMPARISON:  CT the abdomen and pelvis 07/19/2019. FINDINGS: Lower chest: Cardiomegaly. Hepatobiliary: No suspicious cystic or solid hepatic lesions. No intrahepatic biliary ductal dilatation. 5 mm calcified gallstone lying dependently in the gallbladder. Common bile duct is dilated measuring 14 mm in the porta hepatis, similar to the prior examination. No calcified stone identified in the common bile duct. No findings to suggest an acute cholecystitis at this time. Pancreas: No pancreatic mass. Persistent pancreatic ductal dilatation measuring up to 7 mm in the pancreatic head, similar to the prior study from a 07/19/2019. No pancreatic or peripancreatic fluid collections or inflammatory changes. Spleen: Unremarkable. Adrenals/Urinary Tract: Severe atrophy of the kidneys bilaterally. Numerous small renal lesions  bilaterally ranging from low to high attenuation, many of which are subcentimeter in size and too small to characterize, but favored to represent cysts and proteinaceous/hemorrhagic cyst. Several indeterminate lesions are also noted, most notable for a 1.4 cm lesion measuring 59 HU in the lateral aspect of the interpolar region of the right kidney (axial  image 23 of series 2). Multiple tiny calcifications are present in both renal collecting systems measuring up to 3 mm in the lower pole collecting system of the left kidney. No hydroureteronephrosis. Urinary bladder is nearly completely decompressed, but otherwise unremarkable in appearance. Bilateral adrenal glands are normal in appearance. Stomach/Bowel: Normal appearance of the stomach. No pathologic dilatation of small bowel or colon. Severe mural thickening of the rectum and colon where there is also some mucosal hyperenhancement and some hypervascularity in the associated mesocolon, indicative of severe colitis. The appendix is not confidently identified and may be surgically absent. Regardless, there are no inflammatory changes noted adjacent to the cecum to suggest the presence of an acute appendicitis at this time. Vascular/Lymphatic: Aortic atherosclerosis, without evidence of aneurysm or dissection in the abdominal or pelvic vasculature. No lymphadenopathy noted in the abdomen or pelvis. Reproductive: Prostate gland and seminal vesicles are unremarkable in appearance. Other: Small infraumbilical ventral hernia containing only omental fat (neck visualized on axial image 56 of series 2). No significant volume of ascites. No pneumoperitoneum. Musculoskeletal: Diffuse sclerotic changes in the visualized bones with coarsened trabeculations, presumably reflective of renal osteodystrophy. No definite aggressive appearing lytic or blastic lesions are noted in the visualized portions of the skeleton. IMPRESSION: 1. Findings compatible with severe colitis, as  above. 2. Chronic dilatation of the common bile duct and pancreatic duct. No frank intrahepatic biliary ductal dilatation. As previously recommended, follow-up nonemergent abdominal MRI with and without IV gadolinium with MRCP is strongly recommended in the near future to exclude the possibility of an obstructing lesion in the ampulla or pancreatic head. 3. Cholelithiasis without evidence of acute cholecystitis. Additionally, no calcified choledocholithiasis is identified. 4. Multiple cystic lesions of varying degrees of complexity, including some indeterminate lesions bilaterally. Attention at time of forthcoming abdominal MRI with and without IV gadolinium with MRCP is recommended to definitively characterize these lesions. 5. Small infraumbilical ventral hernia containing only omental fat. No associated bowel incarceration or obstruction at this time. 6. Aortic atherosclerosis. 7. Cardiomegaly. 8. Additional incidental findings, as above. Electronically Signed   By: Vinnie Langton M.D.   On: 10/21/2019 04:35    Procedures Procedures (including critical care time)  Medications Ordered in ED Medications  iohexol (OMNIPAQUE) 9 MG/ML oral solution (has no administration in time range)  ciprofloxacin (CIPRO) IVPB 400 mg (has no administration in time range)  metroNIDAZOLE (FLAGYL) IVPB 500 mg (has no administration in time range)  fentaNYL (SUBLIMAZE) injection 50 mcg (50 mcg Intravenous Given 10/21/19 0117)  ondansetron (ZOFRAN) injection 4 mg (4 mg Intravenous Given 10/21/19 0114)  iohexol (OMNIPAQUE) 300 MG/ML solution 100 mL (100 mLs Intravenous Contrast Given 10/21/19 0410)    ED Course  I have reviewed the triage vital signs and the nursing notes.  Pertinent labs & imaging results that were available during my care of the patient were reviewed by me and considered in my medical decision making (see chart for details).    MDM Rules/Calculators/A&P                          Patient was given  IV pain and nausea medication CT was done to evaluate her hernia and abdominal pain.  Her labs were reviewed and her potassium is normal.  I suspect she also is not fluid overloaded because she has been having vomiting and diarrhea.  She has not had any shortness of breath.  5:10 AM I talked to the patient about her  CT result.  She was started on IV Cipro and Flagyl.  We discussed that she should be admitted and she is agreeable.  05:31 AM Dr Clearence Ped, hospitalist will arrange for patient to be admitted.  She also wants patient typed and crossed for 1 unit of blood in case they want to transfuse her during dialysis.  Final Clinical Impression(s) / ED Diagnoses Final diagnoses:  Generalized abdominal pain  Colitis  Vomiting and diarrhea  ESRD (end stage renal disease) ([employer])    Rx / DC Orders  Plan admission  Rolland Porter, MD, Barbette Or, MD 10/21/19 [place], [place], MD 10/21/19 [place], [place], MD 10/21/19 [phone]

## 2019-10-21 NOTE — Procedures (Signed)
     HEMODIALYSIS TREATMENT NOTE:   3 hour heparin-free HD completed via right forearm AVF (16g/antegrade).  Tolerated removal of 1.7L.  UF was interrupted once for 15 minutes for hypotension.  All blood was returned and hemostasis was achieved in 10 minutes.  No changes from pre-HD assessment.  Rockwell Alexandria, RN

## 2019-10-21 NOTE — Consult Note (Signed)
ESRD Consult Note [facility]  Requesting provider: Kathie Dike, MD Reason for consult: ESRD, provision of dialysis  Outpatient dialysis unit: [facility] Outpatient dialysis schedule: TTS  Assessment/Recommendations: [name] is a/an 69 y.o. female with a past medical history notable for ESRD on HD admitted with colitis.   ESRD:  Outpatient records not available, per her previous admission her outpatient orders were: EDW46kgHD Bath 3K/2.5CaTime 3 hoursHeparin 1000 bolus and 500 units/hr. AccessRUE AVFBFR 350DFR 600 -will dialyze today and tentatively will plan for next treatment on Tues 8/24  Severe colitis -Management per primary service.  Received Cipro and Flagyl  Volume/ hypertension: EDW 46kg. Attempt to achieve 1-2L as tolerated  Anemia of Chronic Kidney Disease: Hemoglobin 6.8 on presentation. Currently receiving PRBCs.  Not sure which she is receiving at her outpatient unit i.e. iron versus ESA, will need to obtain outpatient records  Secondary Hyperparathyroidism/Hyperphosphatemia: Continue with binders  Vascular access: Right upper extremity fistula with good bruit and thrill. Skin changes overlying avf, will cannulate around and avoid those areas.  Additional recommendations: - Dose all meds for creatinine clearance < 10 ml/min  - Unless absolutely necessary, no MRIs with gadolinium.  - Implement save arm precautions.  Prefer needle sticks in the dorsum of the hands or wrists.  No blood pressure measurements in arm. - If blood transfusion is requested during hemodialysis sessions, please alert [place] prior to the session.   Recommendations were discussed with the primary team.  Gean Quint, MD [facility]  History of Present Illness: [name] is a/an 69 y.o. female with a past medical history of ESRD who presents with not feeling well.  Has had diarrhea that started on August 19 and has about 5 episodes a day.   She also has abdominal pain.  And has severe colitis on CT.  Last dialysis was Tuesday. Did not do Thursday's treatment do to her not feeling well. Resides in nursing home. Currently receiving PRBC transfusion. Denies fevers, chest pain, SOB.   Medications:  Current Facility-Administered Medications  Medication Dose Route Frequency Provider Last Rate Last Admin  . 0.9 %  sodium chloride infusion (Manually program via Guardrails IV Fluids)   Intravenous Once Rolland Porter, MD      . iohexol (OMNIPAQUE) 9 MG/ML oral solution            Current Outpatient Medications  Medication Sig Dispense Refill  . acetaminophen (TYLENOL) 650 MG CR tablet Take 650 mg by mouth every 8 (eight) hours as needed for pain.  (Patient not taking: Reported on 09/29/2019)    . Amino Acids-Protein Hydrolys (FEEDING SUPPLEMENT, PRO-STAT SUGAR FREE 64,) LIQD Take 30 mLs by mouth daily. (WOUND HEALING) (0600)    . Biotin 5000 MCG CAPS Take 5,000 mcg by mouth daily. (0800)    . calcium acetate, Phos Binder, (PHOSLYRA) 667 MG/5ML SOLN Take 2,001 mg by mouth 3 (three) times daily with meals. (0800, 1200, & 2000)    . calcium carbonate (TUMS - DOSED IN MG ELEMENTAL CALCIUM) 500 MG chewable tablet Chew 2 tablets by mouth in the morning and at bedtime. (0800 & 2100)    . clopidogrel (PLAVIX) 75 MG tablet Take 1 tablet (75 mg total) by mouth daily. (Patient taking differently: Take 75 mg by mouth daily. (0800)) 30 tablet 3  . dolutegravir (TIVICAY) 50 MG tablet Take 50 mg by mouth daily at 4 PM. (1600)    . escitalopram (LEXAPRO) 10 MG tablet Take 10 mg by mouth  daily.    . fentaNYL (DURAGESIC) 50 MCG/HR Place 1 patch onto the skin every 3 (three) days.    Marland Kitchen lamivudine (EPIVIR) 100 MG tablet Take 50 mg by mouth daily. (0600)    . lanolin/mineral oil (KERI/THERA-DERM) LOTN Apply 1 application topically once a week.    . Lidocaine HCl (ASPERCREME W/LIDOCAINE) 4 % CREA Apply 1 application topically 2 (two) times daily as needed (to  bilateral wrists for pain).     . methadone (DOLOPHINE) 5 MG tablet Take 2.5 mg by mouth daily. (2000)    . mirtazapine (REMERON) 15 MG tablet Take 15 mg by mouth at bedtime. (2000)    . Multiple Vitamin (MULTIVITAMIN WITH MINERALS) TABS tablet Take 1 tablet by mouth at bedtime. (2000)    . nitroGLYCERIN (NITROSTAT) 0.4 MG SL tablet Place 0.4 mg under the tongue every 5 (five) minutes as needed for chest pain.     Marland Kitchen omeprazole (PRILOSEC) 20 MG capsule Take 40 mg by mouth daily at 6 (six) AM. (0600)    . ondansetron (ZOFRAN-ODT) 4 MG disintegrating tablet Take 4 mg by mouth every 8 (eight) hours as needed for nausea or vomiting. (Patient not taking: Reported on 09/29/2019)    . Oxycodone HCl 10 MG TABS Take 1 tablet (10 mg total) by mouth every 12 (twelve) hours as needed (severe pain). 5 tablet 0  . OXYGEN Inhale 2 L into the lungs as needed.    . promethazine (PHENERGAN) 12.5 MG tablet Take 12.5 mg by mouth Every Tuesday,Thursday,and Saturday with dialysis. May take an additional 12.5 mg dose every 6 hours as needed for nausea    . sertraline (ZOLOFT) 100 MG tablet Take 200 mg by mouth daily. (0900)    . sertraline (ZOLOFT) 25 MG tablet Take 100 mg by mouth at bedtime. (2000)    . traZODone (DESYREL) 50 MG tablet Take 50 mg by mouth at bedtime. (2100)    . trolamine salicylate (ASPERCREME) 10 % cream Apply 1 application topically as needed for muscle pain.    . vitamin B-12 (CYANOCOBALAMIN) 100 MCG tablet Take 100 mcg by mouth daily. (0800)    . vitamin C (ASCORBIC ACID) 500 MG tablet Take 500 mg by mouth 2 (two) times daily. (0900 & 2100)    . zidovudine (RETROVIR) 100 MG capsule Take 100 mg by mouth 3 (three) times daily. (0900, 1300, & 2100)    . Zinc 220 (50 Zn) MG CAPS Take 220 mg by mouth daily. (0800)     Facility-Administered Medications Ordered in Other Encounters  Medication Dose Route Frequency Provider Last Rate Last Admin  . 0.9 %  sodium chloride infusion (Manually program via  Guardrails IV Fluids)  250 mL Intravenous Once Lockamy, Randi L, NP-C      . acetaminophen (TYLENOL) tablet 650 mg  650 mg Oral Once Lockamy, Randi L, NP-C      . diphenhydrAMINE (BENADRYL) capsule 25 mg  25 mg Oral Once Lockamy, Randi L, NP-C      . sodium chloride flush (NS) 0.9 % injection 10 mL  10 mL Intracatheter PRN Lockamy, Randi L, NP-C         ALLERGIES Lactose intolerance (gi), Latex, and Penicillins  MEDICAL HISTORY Past Medical History:  Diagnosis Date  . Anemia   . Anxiety   . Atrial flutter ([place]) 02/22/2015  . C. difficile colitis 10/10/11  . Chronic diarrhea   . Depression   . ESRD on hemodialysis ([place])   . GERD (gastroesophageal reflux  disease)   . HIV (human immunodeficiency virus infection) ([place])   . Hypertension   . Insomnia   . Intracranial hemorrhage ([place])   . Muscle weakness (generalized)   . Pulmonary HTN ([place])   . Tachycardia   . TIA (transient ischemic attack)   . Traumatic hematoma of right upper arm 02/22/2015     SOCIAL HISTORY Social History   Socioeconomic History  . Marital status: Widowed    Spouse name: Not on file  . Number of children: 1  . Years of education: Not on file  . Highest education level: Not on file  Occupational History  . Not on file  Tobacco Use  . Smoking status: Former Smoker    Quit date: 03/12/1983    Years since quitting: 36.6  . Smokeless tobacco: Never Used  Vaping Use  . Vaping Use: Never used  Substance and Sexual Activity  . Alcohol use: No    Alcohol/week: 0.0 standard drinks  . Drug use: No  . Sexual activity: Not Currently  Other Topics Concern  . Not on file  Social History Narrative  . Not on file   Social Determinants of Health   Financial Resource Strain:   . Difficulty of Paying Living Expenses: Not on file  Food Insecurity:   . Worried About Charity fundraiser in the Last Year: Not on file  . Ran Out of Food in the Last Year: Not on file  Transportation Needs:   . Lack of  Transportation (Medical): Not on file  . Lack of Transportation (Non-Medical): Not on file  Physical Activity:   . Days of Exercise per Week: Not on file  . Minutes of Exercise per Session: Not on file  Stress:   . Feeling of Stress : Not on file  Social Connections:   . Frequency of Communication with Friends and Family: Not on file  . Frequency of Social Gatherings with Friends and Family: Not on file  . Attends Religious Services: Not on file  . Active Member of Clubs or Organizations: Not on file  . Attends Archivist Meetings: Not on file  . Marital Status: Not on file  Intimate Partner Violence:   . Fear of Current or Ex-Partner: Not on file  . Emotionally Abused: Not on file  . Physically Abused: Not on file  . Sexually Abused: Not on file     FAMILY HISTORY Family History  Problem Relation Age of Onset  . Anesthesia problems Neg Hx   . Hypotension Neg Hx   . Malignant hyperthermia Neg Hx   . Pseudochol deficiency Neg Hx      Review of Systems: 12 systems were reviewed and negative except per HPI  Physical Exam: Vitals:   10/21/19 0909 10/21/19 0928  BP:  114/72  Pulse: 62 62  Resp: 10 10  Temp: 98.1 F (36.7 C) 98.1 F (36.7 C)  SpO2: 100% 100%   Total I/O In: 606.4 [Blood:315; IV Piggyback:291.4] Out: -   Intake/Output Summary (Last 24 hours) at 10/21/2019 1016 Last data filed at 10/21/2019 5638 Gross per 24 hour  Intake 606.42 ml  Output --  Net 606.42 ml   General: well-appearing, no acute distress HEENT: anicteric sclera, MMM CV: normal rate, no murmurs Lungs: bilateral chest rise, normal wob, cta bl Abd: soft, non-tender, slightly distended Skin: no visible lesions or rashes Psych: alert, engaged, appropriate mood and affect Neuro: normal speech, no gross focal deficits  Access: rue avf with skin changes  Test Results Reviewed Lab Results  Component Value Date   NA 136 10/20/2019   K 4.0 10/20/2019   CL 97 (L) 10/20/2019    CO2 24 10/20/2019   BUN 52 (H) 10/20/2019   CREATININE 10.99 (H) 10/20/2019   CALCIUM 9.2 10/20/2019   ALBUMIN 3.3 (L) 10/20/2019   PHOS 7.1 (H) 02/22/2015    I have reviewed relevant outside healthcare records

## 2019-10-21 NOTE — ED Notes (Signed)
Date and time results received: 10/21/19 0027  Test: Hemoglobin Critical Value: 6.8  Name of Provider Notified: Rolland Porter, MD  Orders Received? Or Actions Taken?: n/a

## 2019-10-22 LAB — BPAM RBC
Blood Product Expiration Date: 202109222359
Blood Product Expiration Date: 202109262359
ISSUE DATE / TIME: 202108210902
ISSUE DATE / TIME: 202108211404
Unit Type and Rh: 5100
Unit Type and Rh: 9500

## 2019-10-22 LAB — TYPE AND SCREEN
ABO/RH(D): O POS
Antibody Screen: NEGATIVE
Unit division: 0
Unit division: 0

## 2019-10-22 LAB — COMPREHENSIVE METABOLIC PANEL
ALT: 7 U/L (ref 0–44)
AST: 14 U/L — ABNORMAL LOW (ref 15–41)
Albumin: 3.1 g/dL — ABNORMAL LOW (ref 3.5–5.0)
Alkaline Phosphatase: 119 U/L (ref 38–126)
Anion gap: 11 (ref 5–15)
BUN: 19 mg/dL (ref 8–23)
CO2: 27 mmol/L (ref 22–32)
Calcium: 9.4 mg/dL (ref 8.9–10.3)
Chloride: 92 mmol/L — ABNORMAL LOW (ref 98–111)
Creatinine, Ser: 5.36 mg/dL — ABNORMAL HIGH (ref 0.44–1.00)
GFR calc Af Amer: 9 mL/min — ABNORMAL LOW (ref >60)
GFR calc non Af Amer: 8 mL/min — ABNORMAL LOW (ref >60)
Glucose, Bld: 59 mg/dL — ABNORMAL LOW (ref 70–99)
Potassium: 3.2 mmol/L — ABNORMAL LOW (ref 3.5–5.1)
Sodium: 130 mmol/L — ABNORMAL LOW (ref 135–145)
Total Bilirubin: 0.8 mg/dL (ref 0.3–1.2)
Total Protein: 7.4 g/dL (ref 6.5–8.1)

## 2019-10-22 LAB — CBC
HCT: 31 % — ABNORMAL LOW (ref 36.0–46.0)
Hemoglobin: 9.5 g/dL — ABNORMAL LOW (ref 12.0–15.0)
MCH: 31.9 pg (ref 26.0–34.0)
MCHC: 30.6 g/dL (ref 30.0–36.0)
MCV: 104 fL — ABNORMAL HIGH (ref 80.0–100.0)
Platelets: 150 10*3/uL (ref 150–400)
RBC: 2.98 MIL/uL — ABNORMAL LOW (ref 3.87–5.11)
RDW: 20.2 % — ABNORMAL HIGH (ref 11.5–15.5)
WBC: 3.9 10*3/uL — ABNORMAL LOW (ref 4.0–10.5)
nRBC: 0 % (ref 0.0–0.2)

## 2019-10-22 MED ORDER — CIPROFLOXACIN HCL 250 MG PO TABS: 250.0000 mg | ORAL_TABLET | Freq: Two times a day (BID) | ORAL | 0 refills | Status: AC

## 2019-10-22 MED ORDER — IPRATROPIUM-ALBUTEROL 0.5-2.5 (3) MG/3ML IN SOLN
3.0000 mL | Freq: Four times a day (QID) | RESPIRATORY_TRACT | Status: DC
  Administered 2019-10-22 (×2): 3 mL via RESPIRATORY_TRACT
  Filled 2019-10-22 (×2): qty 3

## 2019-10-22 MED ORDER — METRONIDAZOLE 500 MG PO TABS: 500.0000 mg | ORAL_TABLET | Freq: Three times a day (TID) | ORAL | 0 refills | Status: AC

## 2019-10-22 MED ORDER — CIPROFLOXACIN IN D5W 400 MG/200ML IV SOLN: 400.0000 mg | INTRAVENOUS | Status: DC

## 2019-10-22 NOTE — Discharge Summary (Signed)
Physician Discharge Summary  [name] FWY:[id number] DOB: 12-Oct-1950 DOA: 10/20/2019  PCP: Hilbert Corrigan, MD  Admit date: 10/20/2019 Discharge date: 10/22/2019  Admitted From: Skilled nursing facility Disposition: Skilled nursing facility  Recommendations for Outpatient Follow-up:  1. Follow up with PCP in 1-2 weeks 2. Please obtain BMP/CBC in one week 3. Consider outpatient MRCP to better evaluate biliary tree and chronic dilatation of CBD  Discharge Condition:stable CODE STATUS:full code Diet recommendation: heart healthy  Brief/Interim Summary: HPI: [name] is a 69 y.o. female with medical history significant of end-stage renal disease on hemodialysis, HIV, hypertension, chronic pain syndrome, is a resident of a skilled nursing facility.  She presents to the hospital with 2-3 day history of abdominal pain and diarrhea.  Patient reports onset of symptoms after she had macaroni and cheese approximately 3 days ago.  She began having diffuse abdominal pain and frequent bowel movements.  Initially, she noted some dark blood in her stool, but that has since resolved.  She has been feverish.  She has had nausea, but no vomiting.  She said no shortness of breath, cough.  Due to her GI illness, she was unable to attend her last dialysis session on Thursday.  She is brought to the ER for evaluation today.  ED Course: Vitals noted to be stable.  Hemoglobin noted to be low at 6.8.  Chemistry unrevealing.  CT of the abdomen pelvis indicated severe colitis.  She has been referred for admission.  Discharge Diagnoses:  Active Problems:   Human immunodeficiency virus (HIV) disease ([place])   ESRD on dialysis ([place])   Anemia of chronic disease   Colitis   Dilation of common bile duct  1. Acute colitis.  Patient noted to have severe colitis on CT imaging.  She presented with frequent stools, abdominal pain.    Since admission, she did not have any significant stool output and  therefore stool studies were not sent.    She was treated with ciprofloxacin and Flagyl.  Diet was slowly advanced and she is tolerating solid food.  Abdominal pain has improved and she is no longer having loose stools.  Will complete a course of antibiotics. 2. End-stage renal disease on hemodialysis.  Nephrology consulted and patient underwent dialysis on 8/21.  Next scheduled dialysis is 8/24.. 3. Anemia of chronic disease.  Likely related to chronic kidney disease.  She also may have had some blood loss related to colitis.  She was transfused 1 unit of PRBC for hemoglobin of 6.8 which improved to 9.5 posttransfusion 4. HIV.  On antiretroviral therapy.   5. Chronic pain syndrome.  Resume chronic pain medication on discharge 6. Dilated common bile duct.  Appears to be a chronic finding.  Consider outpatient MRCP versus CT abdomen with pancreatic protocol.  Discharge Instructions  Discharge Instructions    Diet - low sodium heart healthy   Complete by: As directed    Increase activity slowly   Complete by: As directed    No wound care   Complete by: As directed      Allergies as of 10/22/2019      Reactions   Lactose Intolerance (gi) Other (See Comments)   On MAR   Latex Rash, Itching   Penicillins Rash, Swelling   Has patient had a PCN reaction causing immediate rash, facial/tongue/throat swelling, SOB or lightheadedness with hypotension: No Has patient had a PCN reaction causing severe rash involving mucus membranes or skin necrosis: No Has patient had a PCN  reaction that required hospitalization No Has patient had a PCN reaction occurring within the last 10 years: No If all of the above answers are "NO", then may proceed with Cephalosporin use.      Medication List    TAKE these medications   acetaminophen 650 MG CR tablet Commonly known as: TYLENOL Take 650 mg by mouth every 8 (eight) hours as needed for pain.   Aspercreme w/Lidocaine 4 % Crea Generic drug: Lidocaine  HCl Apply 1 application topically 2 (two) times daily as needed (to bilateral wrists for pain).   Biotin 5000 MCG Caps Take 5,000 mcg by mouth daily. (0800)   calcium acetate (Phos Binder) 667 MG/5ML Soln Commonly known as: PHOSLYRA Take 2,001 mg by mouth 3 (three) times daily with meals. (0800, 1200, & 2000)   calcium carbonate 500 MG chewable tablet Commonly known as: TUMS - dosed in mg elemental calcium Chew 2 tablets by mouth in the morning and at bedtime. (0800 & 2100)   ciprofloxacin 250 MG tablet Commonly known as: CIPRO Take 1 tablet (250 mg total) by mouth 2 (two) times daily for 10 days.   clopidogrel 75 MG tablet Commonly known as: Plavix Take 1 tablet (75 mg total) by mouth daily. What changed: additional instructions   cyanocobalamin 1000 MCG tablet Take 1,000 mcg by mouth daily.   escitalopram 10 MG tablet Commonly known as: LEXAPRO Take 10 mg by mouth daily.   feeding supplement (PRO-STAT SUGAR FREE 64) Liqd Take 30 mLs by mouth daily. (WOUND HEALING) (0600)   fentaNYL 50 MCG/HR Commonly known as: DURAGESIC Place 1 patch onto the skin every 3 (three) days.   lamivudine 100 MG tablet Commonly known as: EPIVIR Take 50 mg by mouth daily. (0600)   lanolin/mineral oil Lotn Apply 1 application topically once a week.   methadone 5 MG tablet Commonly known as: DOLOPHINE Take 2.5 mg by mouth daily. (2000)   metroNIDAZOLE 500 MG tablet Commonly known as: Flagyl Take 1 tablet (500 mg total) by mouth 3 (three) times daily for 10 days.   mirtazapine 15 MG tablet Commonly known as: REMERON Take 15 mg by mouth at bedtime. (2000)   multivitamin with minerals Tabs tablet Take 1 tablet by mouth at bedtime. (2000)   nitroGLYCERIN 0.4 MG SL tablet Commonly known as: NITROSTAT Place 0.4 mg under the tongue every 5 (five) minutes as needed for chest pain.   omeprazole 20 MG capsule Commonly known as: PRILOSEC Take 40 mg by mouth daily at 6 (six) AM. (0600)    ondansetron 4 MG disintegrating tablet Commonly known as: ZOFRAN-ODT Take 4 mg by mouth every 8 (eight) hours as needed for nausea or vomiting.   Oxycodone HCl 10 MG Tabs Take 1 tablet (10 mg total) by mouth every 12 (twelve) hours as needed (severe pain).   OXYGEN Inhale 2 L into the lungs as needed.   promethazine 12.5 MG tablet Commonly known as: PHENERGAN Take 12.5 mg by mouth Every Tuesday,Thursday,and Saturday with dialysis. May take an additional 12.5 mg dose every 6 hours as needed for nausea   sertraline 100 MG tablet Commonly known as: ZOLOFT Take 200 mg by mouth daily. (0900)   sertraline 25 MG tablet Commonly known as: ZOLOFT Take 100 mg by mouth at bedtime. (2000)   Tivicay 50 MG tablet Generic drug: dolutegravir Take 50 mg by mouth daily at 4 PM. (1600)   traZODone 50 MG tablet Commonly known as: DESYREL Take 50 mg by mouth at bedtime. (2100)  trolamine salicylate 10 % cream Commonly known as: ASPERCREME Apply 1 application topically as needed for muscle pain.   vitamin C 500 MG tablet Commonly known as: ASCORBIC ACID Take 500 mg by mouth 2 (two) times daily. (0900 & 2100)   zidovudine 100 MG capsule Commonly known as: RETROVIR Take 100 mg by mouth 3 (three) times daily. (0900, 1300, & 2100)   Zinc 220 (50 Zn) MG Caps Take 220 mg by mouth daily. (0800)       Allergies  Allergen Reactions  . Lactose Intolerance (Gi) Other (See Comments)    On MAR  . Latex Rash and Itching  . Penicillins Rash and Swelling    Has patient had a PCN reaction causing immediate rash, facial/tongue/throat swelling, SOB or lightheadedness with hypotension: No Has patient had a PCN reaction causing severe rash involving mucus membranes or skin necrosis: No Has patient had a PCN reaction that required hospitalization No Has patient had a PCN reaction occurring within the last 10 years: No If all of the above answers are "NO", then may proceed with Cephalosporin use.       Consultations:  Nephrology   Procedures/Studies: CT Abdomen Pelvis W Contrast  Result Date: 10/21/2019 CLINICAL DATA:  69 year old female with history of abdominal pain. Suspected abdominal hernia. EXAM: CT ABDOMEN AND PELVIS WITH CONTRAST TECHNIQUE: Multidetector CT imaging of the abdomen and pelvis was performed using the standard protocol following bolus administration of intravenous contrast. CONTRAST:  187mL OMNIPAQUE IOHEXOL 300 MG/ML  SOLN COMPARISON:  CT the abdomen and pelvis 07/19/2019. FINDINGS: Lower chest: Cardiomegaly. Hepatobiliary: No suspicious cystic or solid hepatic lesions. No intrahepatic biliary ductal dilatation. 5 mm calcified gallstone lying dependently in the gallbladder. Common bile duct is dilated measuring 14 mm in the porta hepatis, similar to the prior examination. No calcified stone identified in the common bile duct. No findings to suggest an acute cholecystitis at this time. Pancreas: No pancreatic mass. Persistent pancreatic ductal dilatation measuring up to 7 mm in the pancreatic head, similar to the prior study from a 07/19/2019. No pancreatic or peripancreatic fluid collections or inflammatory changes. Spleen: Unremarkable. Adrenals/Urinary Tract: Severe atrophy of the kidneys bilaterally. Numerous small renal lesions bilaterally ranging from low to high attenuation, many of which are subcentimeter in size and too small to characterize, but favored to represent cysts and proteinaceous/hemorrhagic cyst. Several indeterminate lesions are also noted, most notable for a 1.4 cm lesion measuring 59 HU in the lateral aspect of the interpolar region of the right kidney (axial image 23 of series 2). Multiple tiny calcifications are present in both renal collecting systems measuring up to 3 mm in the lower pole collecting system of the left kidney. No hydroureteronephrosis. Urinary bladder is nearly completely decompressed, but otherwise unremarkable in appearance. Bilateral  adrenal glands are normal in appearance. Stomach/Bowel: Normal appearance of the stomach. No pathologic dilatation of small bowel or colon. Severe mural thickening of the rectum and colon where there is also some mucosal hyperenhancement and some hypervascularity in the associated mesocolon, indicative of severe colitis. The appendix is not confidently identified and may be surgically absent. Regardless, there are no inflammatory changes noted adjacent to the cecum to suggest the presence of an acute appendicitis at this time. Vascular/Lymphatic: Aortic atherosclerosis, without evidence of aneurysm or dissection in the abdominal or pelvic vasculature. No lymphadenopathy noted in the abdomen or pelvis. Reproductive: Prostate gland and seminal vesicles are unremarkable in appearance. Other: Small infraumbilical ventral hernia containing only omental fat (neck  visualized on axial image 56 of series 2). No significant volume of ascites. No pneumoperitoneum. Musculoskeletal: Diffuse sclerotic changes in the visualized bones with coarsened trabeculations, presumably reflective of renal osteodystrophy. No definite aggressive appearing lytic or blastic lesions are noted in the visualized portions of the skeleton. IMPRESSION: 1. Findings compatible with severe colitis, as above. 2. Chronic dilatation of the common bile duct and pancreatic duct. No frank intrahepatic biliary ductal dilatation. As previously recommended, follow-up nonemergent abdominal MRI with and without IV gadolinium with MRCP is strongly recommended in the near future to exclude the possibility of an obstructing lesion in the ampulla or pancreatic head. 3. Cholelithiasis without evidence of acute cholecystitis. Additionally, no calcified choledocholithiasis is identified. 4. Multiple cystic lesions of varying degrees of complexity, including some indeterminate lesions bilaterally. Attention at time of forthcoming abdominal MRI with and without IV  gadolinium with MRCP is recommended to definitively characterize these lesions. 5. Small infraumbilical ventral hernia containing only omental fat. No associated bowel incarceration or obstruction at this time. 6. Aortic atherosclerosis. 7. Cardiomegaly. 8. Additional incidental findings, as above. Electronically Signed   By: Vinnie Langton M.D.   On: 10/21/2019 04:35       Subjective: Patient is feeling better.  Overall diarrhea has improved.  She has had 1 bowel movement today.  Abdominal pain is better and she is tolerating solid diet.  Discharge Exam: Vitals:   10/22/19 0514 10/22/19 1300 10/22/19 1413 10/22/19 1540  BP: (!) 123/56 121/63    Pulse: (!) 55 (!) 58  70  Resp: 16 18    Temp: 98.5 F (36.9 C) 98.3 F (36.8 C)    TempSrc: Oral     SpO2: 95% 100% 99% 95%  Weight:      Height:        General: Pt is alert, awake, not in acute distress Cardiovascular: RRR, S1/S2 +, no rubs, no gallops Respiratory: CTA bilaterally, no wheezing, no rhonchi Abdominal: Soft, NT, ND, bowel sounds + Extremities: no edema, no cyanosis    The results of significant diagnostics from this hospitalization (including imaging, microbiology, ancillary and laboratory) are listed below for reference.     Microbiology: Recent Results (from the past 240 hour(s))  SARS Coronavirus 2 by RT PCR (hospital order, performed in [facility] hospital lab) Nasopharyngeal Nasopharyngeal Swab     Status: None   Collection Time: 10/21/19  5:16 AM   Specimen: Nasopharyngeal Swab  Result Value Ref Range Status   SARS Coronavirus 2 NEGATIVE NEGATIVE Final    Comment: (NOTE) SARS-CoV-2 target nucleic acids are NOT DETECTED.  The SARS-CoV-2 RNA is generally detectable in upper and lower respiratory specimens during the acute phase of infection. The lowest concentration of SARS-CoV-2 viral copies this assay can detect is 250 copies / mL. A negative result does not preclude SARS-CoV-2 infection and should not  be used as the sole basis for treatment or other patient management decisions.  A negative result may occur with improper specimen collection / handling, submission of specimen other than nasopharyngeal swab, presence of viral mutation(s) within the areas targeted by this assay, and inadequate number of viral copies (<250 copies / mL). A negative result must be combined with clinical observations, patient history, and epidemiological information.  Fact Sheet for Patients:   StrictlyIdeas.no  Fact Sheet for Healthcare Providers: BankingDealers.co.za  This test is not yet approved or  cleared by the [place] FDA and has been authorized for detection and/or diagnosis of SARS-CoV-2 by FDA under  an Emergency Use Authorization (EUA).  This EUA will remain in effect (meaning this test can be used) for the duration of the COVID-19 declaration under Section 564(b)(1) of the Act, 21 U.S.C. section 360bbb-3(b)(1), unless the authorization is terminated or revoked sooner.  Performed at [facility], [address], [place], [place] [address]      Labs: BNP (last 3 results) No results for input(s): BNP in the last 8760 hours. Basic Metabolic Panel: Recent Labs  Lab 10/18/19 0941 10/20/19 2330 10/22/19 0105  NA 139 136 130*  K 3.8 4.0 3.2*  CL 99 97* 92*  CO2 28 24 27   GLUCOSE 94 92 59*  BUN 32* 52* 19  CREATININE 6.15* 10.99* 5.36*  CALCIUM 9.5 9.2 9.4   Liver Function Tests: Recent Labs  Lab 10/18/19 0941 10/20/19 2330 10/22/19 0105  AST 15 13* 14*  ALT 9 8 7   ALKPHOS 139* 131* 119  BILITOT 0.4 0.6 0.8  PROT 8.1 7.9 7.4  ALBUMIN 3.3* 3.3* 3.1*   Recent Labs  Lab 10/20/19 2330  LIPASE 51   No results for input(s): AMMONIA in the last 168 hours. CBC: Recent Labs  Lab 10/18/19 0941 10/20/19 2330 10/22/19 0105  WBC 3.2* 5.1 3.9*  NEUTROABS 2.2  --   --   HGB 7.1* 6.8* 9.5*  HCT 24.5* 23.0* 31.0*  MCV 114.0*  113.9* 104.0*  PLT 144* 168 150   Cardiac Enzymes: No results for input(s): CKTOTAL, CKMB, CKMBINDEX, TROPONINI in the last 168 hours. BNP: Invalid input(s): POCBNP CBG: No results for input(s): GLUCAP in the last 168 hours. D-Dimer No results for input(s): DDIMER in the last 72 hours. Hgb A1c No results for input(s): HGBA1C in the last 72 hours. Lipid Profile No results for input(s): CHOL, HDL, LDLCALC, TRIG, CHOLHDL, LDLDIRECT in the last 72 hours. Thyroid function studies No results for input(s): TSH, T4TOTAL, T3FREE, THYROIDAB in the last 72 hours.  Invalid input(s): FREET3 Anemia work up No results for input(s): VITAMINB12, FOLATE, FERRITIN, TIBC, IRON, RETICCTPCT in the last 72 hours. Urinalysis    Component Value Date/Time   COLORURINE YELLOW 01/19/2009 1855   APPEARANCEUR CLEAR 01/19/2009 1855   LABSPEC 1.015 01/19/2009 1855   PHURINE 7.5 01/19/2009 1855   GLUCOSEU NEGATIVE 01/19/2009 1855   HGBUR SMALL (A) 01/19/2009 1855   BILIRUBINUR NEGATIVE 01/19/2009 1855   KETONESUR NEGATIVE 01/19/2009 1855   PROTEINUR 100 (A) 01/19/2009 1855   UROBILINOGEN 0.2 01/19/2009 1855   NITRITE NEGATIVE 01/19/2009 1855   LEUKOCYTESUR NEGATIVE 01/19/2009 1855   Sepsis Labs Invalid input(s): PROCALCITONIN,  WBC,  LACTICIDVEN Microbiology Recent Results (from the past 240 hour(s))  SARS Coronavirus 2 by RT PCR (hospital order, performed in [place] hospital lab) Nasopharyngeal Nasopharyngeal Swab     Status: None   Collection Time: 10/21/19  5:16 AM   Specimen: Nasopharyngeal Swab  Result Value Ref Range Status   SARS Coronavirus 2 NEGATIVE NEGATIVE Final    Comment: (NOTE) SARS-CoV-2 target nucleic acids are NOT DETECTED.  The SARS-CoV-2 RNA is generally detectable in upper and lower respiratory specimens during the acute phase of infection. The lowest concentration of SARS-CoV-2 viral copies this assay can detect is 250 copies / mL. A negative result does not preclude  SARS-CoV-2 infection and should not be used as the sole basis for treatment or other patient management decisions.  A negative result may occur with improper specimen collection / handling, submission of specimen other than nasopharyngeal swab, presence of viral mutation(s) within the  areas targeted by this assay, and inadequate number of viral copies (<250 copies / mL). A negative result must be combined with clinical observations, patient history, and epidemiological information.  Fact Sheet for Patients:   StrictlyIdeas.no  Fact Sheet for Healthcare Providers: BankingDealers.co.za  This test is not yet approved or  cleared by the [place] FDA and has been authorized for detection and/or diagnosis of SARS-CoV-2 by FDA under an Emergency Use Authorization (EUA).  This EUA will remain in effect (meaning this test can be used) for the duration of the COVID-19 declaration under Section 564(b)(1) of the Act, 21 U.S.C. section 360bbb-3(b)(1), unless the authorization is terminated or revoked sooner.  Performed at [facility], [address], [place], [place] [address]      Time coordinating discharge: 54mins  SIGNED:   Kathie Dike, MD  [facility] 10/22/2019, 5:33 PM   If 7PM-7AM, please contact night-coverage [email or web address]

## 2019-10-22 NOTE — Progress Notes (Signed)
Had two loose stools today, not watery or bloody.  Tolerated soft diet for lunch with no nausea.  IV removed and report called to Elmon Else, RN at [facility].  EMS to transport

## 2019-10-22 NOTE — Progress Notes (Signed)
Patient discharged from unit via EMS transport. All belongings given to patient and transport. VSS and patient in no sign of distress. All discharge paperwork given to patient on dayshift. Report called to [facility] and received by [employer]. Verbalized understanding of care.

## 2019-10-22 NOTE — Progress Notes (Signed)
[facility] Progress Note    Assessment/ Plan:   ESRD:  Outpatient records not available, per her previous admission her outpatient orders were: EDW46kgHD Bath 3K/2.5CaTime 3 hoursHeparin 1000 bolus and 500 units/hr. AccessRUE AVFBFR 350DFR 600 -no indication for dialysis today, next treatment tentatively planned for tues 8/24 -will need to obtain outpatient records  Severe colitis -Management per primary service.  Received Cipro and Flagyl -cdiff being ruled out given immunocompromised (h/o HIV) and resides in nsg facility  Volume/ hypertension: EDW 46kg. UF as tolerated  Anemia of Chronic Kidney Disease: Hemoglobin 6.8 on presentation, received prbc on admit, hgb now 9.5.  Not sure which she is receiving at her outpatient unit i.e. iron versus ESA, will need to obtain outpatient records  Secondary Hyperparathyroidism/Hyperphosphatemia: Continue with binders  Vascular access: Right upper extremity fistula with good bruit and thrill. Skin changes overlying avf, will cannulate around and avoid those areas.  Additional recommendations: - Dose all meds for creatinine clearance <10 ml/min  - Unless absolutely necessary, no MRIs with gadolinium.  - Implement save arm precautions. Prefer needle sticks in the dorsum of the hands or wrists. No blood pressure measurements in arm. - If blood transfusion is requested during hemodialysis sessions, please alert [place] prior to the session.   Outpatient dialysis orders (per last admission here) [name], TTS, EDW46kgHD Bath 3K/2.5CaTime 3 hoursHeparin 1000 bolus and 500 units/hr. AccessRUE AVFBFR 350DFR Williams, MD [facility] 10/22/2019, 3:37 PM   Subjective:   Tolerated dialysis yesterday, net uf 1.7L. may be discharged today per staff. No complaints. Tolerating diet.   Objective:   BP 121/63 (BP Location: Left Leg)   Pulse (!) 58   Temp 98.3 F (36.8 C)   Resp 18    Ht 4\' 10"  (1.473 m)   Wt 45.9 kg   SpO2 99%   BMI 21.15 kg/m   Intake/Output Summary (Last 24 hours) at 10/22/2019 1537 Last data filed at 10/22/2019 0900 Gross per 24 hour  Intake 646.67 ml  Output 1615 ml  Net -968.33 ml   Weight change: 4.5 kg  Physical Exam: Gen:nad, comfortable, sitting up in bed CVS:reg rate Resp:normal wob, bl chest expansion SFK:[id number] Ext:no edema ACCESS: rue avf +b/t (overlying skin changes)  Imaging: CT Abdomen Pelvis W Contrast  Result Date: 10/21/2019 CLINICAL DATA:  69 year old female with history of abdominal pain. Suspected abdominal hernia. EXAM: CT ABDOMEN AND PELVIS WITH CONTRAST TECHNIQUE: Multidetector CT imaging of the abdomen and pelvis was performed using the standard protocol following bolus administration of intravenous contrast. CONTRAST:  145mL OMNIPAQUE IOHEXOL 300 MG/ML  SOLN COMPARISON:  CT the abdomen and pelvis 07/19/2019. FINDINGS: Lower chest: Cardiomegaly. Hepatobiliary: No suspicious cystic or solid hepatic lesions. No intrahepatic biliary ductal dilatation. 5 mm calcified gallstone lying dependently in the gallbladder. Common bile duct is dilated measuring 14 mm in the porta hepatis, similar to the prior examination. No calcified stone identified in the common bile duct. No findings to suggest an acute cholecystitis at this time. Pancreas: No pancreatic mass. Persistent pancreatic ductal dilatation measuring up to 7 mm in the pancreatic head, similar to the prior study from a 07/19/2019. No pancreatic or peripancreatic fluid collections or inflammatory changes. Spleen: Unremarkable. Adrenals/Urinary Tract: Severe atrophy of the kidneys bilaterally. Numerous small renal lesions bilaterally ranging from low to high attenuation, many of which are subcentimeter in size and too small to characterize, but favored to represent cysts and proteinaceous/hemorrhagic cyst. Several indeterminate lesions are also noted,  most notable for a 1.4  cm lesion measuring 59 HU in the lateral aspect of the interpolar region of the right kidney (axial image 23 of series 2). Multiple tiny calcifications are present in both renal collecting systems measuring up to 3 mm in the lower pole collecting system of the left kidney. No hydroureteronephrosis. Urinary bladder is nearly completely decompressed, but otherwise unremarkable in appearance. Bilateral adrenal glands are normal in appearance. Stomach/Bowel: Normal appearance of the stomach. No pathologic dilatation of small bowel or colon. Severe mural thickening of the rectum and colon where there is also some mucosal hyperenhancement and some hypervascularity in the associated mesocolon, indicative of severe colitis. The appendix is not confidently identified and may be surgically absent. Regardless, there are no inflammatory changes noted adjacent to the cecum to suggest the presence of an acute appendicitis at this time. Vascular/Lymphatic: Aortic atherosclerosis, without evidence of aneurysm or dissection in the abdominal or pelvic vasculature. No lymphadenopathy noted in the abdomen or pelvis. Reproductive: Prostate gland and seminal vesicles are unremarkable in appearance. Other: Small infraumbilical ventral hernia containing only omental fat (neck visualized on axial image 56 of series 2). No significant volume of ascites. No pneumoperitoneum. Musculoskeletal: Diffuse sclerotic changes in the visualized bones with coarsened trabeculations, presumably reflective of renal osteodystrophy. No definite aggressive appearing lytic or blastic lesions are noted in the visualized portions of the skeleton. IMPRESSION: 1. Findings compatible with severe colitis, as above. 2. Chronic dilatation of the common bile duct and pancreatic duct. No frank intrahepatic biliary ductal dilatation. As previously recommended, follow-up nonemergent abdominal MRI with and without IV gadolinium with MRCP is strongly recommended in the near  future to exclude the possibility of an obstructing lesion in the ampulla or pancreatic head. 3. Cholelithiasis without evidence of acute cholecystitis. Additionally, no calcified choledocholithiasis is identified. 4. Multiple cystic lesions of varying degrees of complexity, including some indeterminate lesions bilaterally. Attention at time of forthcoming abdominal MRI with and without IV gadolinium with MRCP is recommended to definitively characterize these lesions. 5. Small infraumbilical ventral hernia containing only omental fat. No associated bowel incarceration or obstruction at this time. 6. Aortic atherosclerosis. 7. Cardiomegaly. 8. Additional incidental findings, as above. Electronically Signed   By: Vinnie Langton M.D.   On: 10/21/2019 04:35    Labs: BMET Recent Labs  Lab 10/18/19 0941 10/20/19 2330 10/22/19 0105  NA 139 136 130*  K 3.8 4.0 3.2*  CL 99 97* 92*  CO2 28 24 27   GLUCOSE 94 92 59*  BUN 32* 52* 19  CREATININE 6.15* 10.99* 5.36*  CALCIUM 9.5 9.2 9.4   CBC Recent Labs  Lab 10/18/19 0941 10/20/19 2330 10/22/19 0105  WBC 3.2* 5.1 3.9*  NEUTROABS 2.2  --   --   HGB 7.1* 6.8* 9.5*  HCT 24.5* 23.0* 31.0*  MCV 114.0* 113.9* 104.0*  PLT 144* 168 150    Medications:    . sodium chloride   Intravenous Once  . sodium chloride   Intravenous Once  . Chlorhexidine Gluconate Cloth  6 each Topical Q0600  . ipratropium-albuterol  3 mL Nebulization Q6H

## 2019-10-22 NOTE — TOC Initial Note (Signed)
Transition of Care [facility]) - Initial/Assessment Note    Patient Details  Name: [name] MRN: [id number] Date of Birth: 29-Aug-1950  Transition of Care [facility]) CM/SW Contact:    Shade Flood, LCSW Phone Number: 10/22/2019, 10:03 AM  Clinical Narrative:                  Pt admitted from [facility] long term care. Plan is for return to [facility] by EMS at [facility]. TOC will follow and assist with dc planning as needed.  Expected Discharge Plan: Long Term Nursing Home Barriers to Discharge: Continued Medical Work up   Patient Goals and CMS Choice        Expected Discharge Plan and Services Expected Discharge Plan: [place] In-house Referral: Clinical Social Work     Living arrangements for the past 2 months: [place]                                      Prior Living Arrangements/Services Living arrangements for the past 2 months: [place] Lives with:: Facility Resident Patient language and need for interpreter reviewed:: Yes Do you feel safe going back to the place where you live?: Yes      Need for Family Participation in Patient Care: No (Comment) Care giver support system in place?: Yes (comment)   Criminal Activity/Legal Involvement Pertinent to Current Situation/Hospitalization: No - Comment as needed  Activities of Daily Living Home Assistive Devices/Equipment: Other (Comment) (from [facility]) ADL Screening (condition at time of admission) Patient's cognitive ability adequate to safely complete daily activities?: Yes Is the patient deaf or have difficulty hearing?: No Does the patient have difficulty seeing, even when wearing glasses/contacts?: No Does the patient have difficulty concentrating, remembering, or making decisions?: No Patient able to express need for assistance with ADLs?: Yes Does the patient have difficulty dressing or bathing?: Yes Independently performs ADLs?: No Communication:  Independent Dressing (OT): Needs assistance Is this a change from baseline?: Pre-admission baseline Grooming: Needs assistance Is this a change from baseline?: Pre-admission baseline Feeding: Needs assistance Is this a change from baseline?: Pre-admission baseline Bathing: Needs assistance Is this a change from baseline?: Pre-admission baseline Toileting: Needs assistance Is this a change from baseline?: Pre-admission baseline In/Out Bed: Needs assistance Is this a change from baseline?: Pre-admission baseline Walks in Home: Needs assistance Is this a change from baseline?: Pre-admission baseline Does the patient have difficulty walking or climbing stairs?: Yes Weakness of Legs: Both Weakness of Arms/Hands: None  Permission Sought/Granted                  Emotional Assessment       Orientation: : Oriented to Self, Oriented to Place, Oriented to  Time, Oriented to Situation Alcohol / Substance Use: Not Applicable Psych Involvement: No (comment)  Admission diagnosis:  Colitis [K52.9] Generalized abdominal pain [R10.84] ESRD (end stage renal disease) ([place]) [N18.6] Vomiting and diarrhea [R11.10, R19.7] Patient Active Problem List   Diagnosis Date Noted   Common bile duct dilation    Lobar pneumonia ([place]) 07/20/2019   Acute respiratory failure with hypoxia ([place]) 07/20/2019   Colitis 07/19/2019   Dilation of common bile duct 07/19/2019   Ventral hernia without obstruction or gangrene    Hypocalcemia 03/07/2019   Other pancytopenia ([place]) 12/08/2018   Anemia of chronic disease 04/17/2016   Hepatitis B immune 01/28/2016   Atrial  flutter ([place]) 02/22/2015   Hyperkalemia 02/20/2015   Venous stenosis of right upper extremity 04/06/2014   ESRD on dialysis ([place]) 04/06/2014   Essential hypertension    Nausea with vomiting 12/15/2012   CHF, chronic ([place]) 11/25/2012   Atrial fibrillation ([place]) 05/02/2012   Intracranial bleed ([place]) 12/28/2011   Angioedema  of lips 09/21/2011   Cellulitis of breast 03/09/2011   Erosive esophagitis 12/15/2010   Mallory - Weiss tear 12/15/2010   UGI bleed 12/13/2010   DIARRHEA 04/12/2009   Human immunodeficiency virus (HIV) disease ([place]) 11/15/2008   PANCREATITIS, HX OF 11/15/2008   TOBACCO USE, QUIT 11/15/2008   PAIN IN JOINT, UPPER ARM 08/07/2008   ABDOMINAL WALL HERNIA 04/26/2007   BRONCHITIS, ACUTE 11/16/2006   HYPOKALEMIA, HX OF 07/13/2006   DEPRESSION 03/23/2006   Essential hypertension, malignant 03/23/2006   GERD 03/23/2006   PANCREATITIS 03/23/2006   MASS, RIGHT AXILLA 03/23/2006   HEADACHE 03/23/2006   SYMPTOM, ENLARGEMENT, LYMPH NODES 03/23/2006   SHINGLES, HX OF 03/23/2006   HYSTERECTOMY, TOTAL, HX OF 03/23/2006   PCP:  Hilbert Corrigan, MD Pharmacy:   [facility] [place] [address] [place] [address] Phone: [phone] Fax: [phone]     Social Determinants of Health (SDOH) Interventions    Readmission Risk Interventions Readmission Risk Prevention Plan 10/22/2019  Transportation Screening Complete  PCP or Specialist Appt within 3-5 Days Not Complete  Not Complete comments SNF MD to follow  [place] or [place] Not Complete  HRI or Home Care Consult comments Pt resides in a SNF  Social Work Consult for [place] Planning/Counseling Complete  Palliative Care Screening Not Applicable  Medication Review Press photographer) Complete  Some recent data might be hidden

## 2019-10-22 NOTE — TOC Transition Note (Signed)
Transition of Care [facility]) - CM/SW Discharge Note   Patient Details  Name: [name] MRN: [id number] Date of Birth: 1951-01-22  [facility]) CM/SW Contact:  Natasha Bence, LCSW Phone Number: 10/22/2019, 6:58 PM   Clinical Narrative:    CSW confirmed that [facility] would be able to receive patient upon discharge. CSW faxed patient's discharge summary and FL2. CSW also called [facility] for transport in addition to completing the med necessity transport form. Nurse to call report. TOC signing off.    Final next level of care: Assisted Living Barriers to Discharge: Barriers Resolved   Patient Goals and CMS Choice        Discharge Placement                Patient to be transferred to facility by: [facility]      Discharge Plan and Services In-house Referral: Clinical Social Work                                   Social Determinants of Health (SDOH) Interventions     Readmission Risk Interventions Readmission Risk Prevention Plan 10/22/2019  Transportation Screening Complete  PCP or Specialist Appt within 3-5 Days Not Complete  Not Complete comments SNF MD to follow  [place] or [place] Not Complete  HRI or Home Care Consult comments Pt resides in a SNF  Social Work Consult for [place] Planning/Counseling [place] Not Applicable  Medication Review Press photographer) Complete  Some recent data might be hidden

## 2019-10-22 NOTE — NC FL2 (Signed)
[place] MEDICAID FL2 LEVEL OF CARE SCREENING TOOL     IDENTIFICATION  Patient Name: [name] Birthdate: 1950/05/03 Sex: female Admission Date (Current Location): 10/20/2019  [facility] and [place] Number:  [employer] and Address:  [facility] [address] [place]      Provider Number: [id number]  Attending Physician Name and Address:  Kathie Dike, MD  Relative Name and Phone Number:       Current Level of Care: Hospital Recommended Level of Care: [place] Prior Approval Number:    Date Approved/Denied:   PASRR Number:    Discharge Plan: SNF    Current Diagnoses: Patient Active Problem List   Diagnosis Date Noted  . Common bile duct dilation   . Lobar pneumonia ([place]) 07/20/2019  . Acute respiratory failure with hypoxia ([place]) 07/20/2019  . Colitis 07/19/2019  . Dilation of common bile duct 07/19/2019  . Ventral hernia without obstruction or gangrene   . Hypocalcemia 03/07/2019  . Other pancytopenia ([place]) 12/08/2018  . Anemia of chronic disease 04/17/2016  . Hepatitis B immune 01/28/2016  . Atrial flutter ([place]) 02/22/2015  . Hyperkalemia 02/20/2015  . Venous stenosis of right upper extremity 04/06/2014  . ESRD on dialysis ([place]) 04/06/2014  . Essential hypertension   . Nausea with vomiting 12/15/2012  . CHF, chronic ([place]) 11/25/2012  . Atrial fibrillation ([place]) 05/02/2012  . Intracranial bleed ([place]) 12/28/2011  . Angioedema of lips 09/21/2011  . Cellulitis of breast 03/09/2011  . Erosive esophagitis 12/15/2010  . Mallory - Weiss tear 12/15/2010  . UGI bleed 12/13/2010  . DIARRHEA 04/12/2009  . Human immunodeficiency virus (HIV) disease ([place]) 11/15/2008  . PANCREATITIS, HX OF 11/15/2008  . TOBACCO USE, QUIT 11/15/2008  . PAIN IN JOINT, UPPER ARM 08/07/2008  . ABDOMINAL WALL HERNIA 04/26/2007  . BRONCHITIS, ACUTE 11/16/2006  . HYPOKALEMIA, HX OF 07/13/2006  . DEPRESSION 03/23/2006  .  Essential hypertension, malignant 03/23/2006  . GERD 03/23/2006  . PANCREATITIS 03/23/2006  . MASS, RIGHT AXILLA 03/23/2006  . HEADACHE 03/23/2006  . SYMPTOM, ENLARGEMENT, LYMPH NODES 03/23/2006  . SHINGLES, HX OF 03/23/2006  . HYSTERECTOMY, TOTAL, HX OF 03/23/2006    Orientation RESPIRATION BLADDER Height & Weight     Self, Time, Situation, Place  Normal Continent Weight: 101 lb 3.1 oz (45.9 kg) Height:  4\' 10"  (147.3 cm)  BEHAVIORAL SYMPTOMS/MOOD NEUROLOGICAL BOWEL NUTRITION STATUS      Continent Diet (see dc summary)  AMBULATORY STATUS COMMUNICATION OF NEEDS Skin   Extensive Assist Verbally PU Stage and Appropriate Care ([place])                       Personal Care Assistance Level of Assistance  Bathing, Feeding, Dressing Bathing Assistance: Limited assistance Feeding assistance: Independent Dressing Assistance: Limited assistance     Functional Limitations Info  Sight, Hearing, Speech Sight Info: Adequate Hearing Info: Adequate Speech Info: Adequate    SPECIAL CARE FACTORS FREQUENCY                       Contractures Contractures Info: Not present    Additional Factors Info  Code Status, Allergies Code Status Info: Full Allergies Info: Latex, Penicillins, Lactose Intolerance           Current Medications (10/22/2019):  This is the current hospital active medication list Current Facility-Administered Medications  Medication Dose Route Frequency Provider Last Rate Last Admin  . 0.9 %  sodium chloride infusion (Manually program via Guardrails IV Fluids)   Intravenous Once Kathie Dike, MD      . 0.9 %  sodium chloride infusion (Manually program via Guardrails IV Fluids)   Intravenous Once Kathie Dike, MD      . 0.9 %  sodium chloride infusion  100 mL Intravenous PRN Gean Quint, MD      . 0.9 %  sodium chloride infusion  100 mL Intravenous PRN Gean Quint, MD      . acetaminophen (TYLENOL) tablet 650 mg  650 mg Oral Q6H PRN Kathie Dike,  MD   650 mg at 10/22/19 4097   Or  . acetaminophen (TYLENOL) suppository 650 mg  650 mg Rectal Q6H PRN Kathie Dike, MD      . alteplase (CATHFLO ACTIVASE) injection 2 mg  2 mg Intracatheter Once PRN Gean Quint, MD      . Chlorhexidine Gluconate Cloth 2 % PADS 6 each  6 each Topical Q0600 Gean Quint, MD      . ciprofloxacin (CIPRO) IVPB 400 mg  400 mg Intravenous Q12H Kathie Dike, MD 200 mL/hr at 10/22/19 0231 400 mg at 10/22/19 0231  . heparin injection 1,000 Units  1,000 Units Dialysis PRN Gean Quint, MD      . lidocaine (PF) (XYLOCAINE) 1 % injection 5 mL  5 mL Intradermal PRN Gean Quint, MD      . lidocaine-prilocaine (EMLA) cream 1 application  1 application Topical PRN Gean Quint, MD      . metroNIDAZOLE (FLAGYL) IVPB 500 mg  500 mg Intravenous Q8H Kathie Dike, MD 100 mL/hr at 10/22/19 0515 500 mg at 10/22/19 0515  . ondansetron (ZOFRAN) tablet 4 mg  4 mg Oral Q6H PRN Kathie Dike, MD       Or  . ondansetron (ZOFRAN) injection 4 mg  4 mg Intravenous Q6H PRN Kathie Dike, MD      . pentafluoroprop-tetrafluoroeth (GEBAUERS) aerosol 1 application  1 application Topical PRN Gean Quint, MD       Facility-Administered Medications Ordered in Other Encounters  Medication Dose Route Frequency Provider Last Rate Last Admin  . 0.9 %  sodium chloride infusion (Manually program via Guardrails IV Fluids)  250 mL Intravenous Once Lock[place], Randi L, NP-C      . acetaminophen (TYLENOL) tablet 650 mg  650 mg Oral Once Lockamy, Randi L, NP-C      . diphenhydrAMINE (BENADRYL) capsule 25 mg  25 mg Oral Once Lockamy, Randi L, NP-C      . sodium chloride flush (NS) 0.9 % injection 10 mL  10 mL Intracatheter PRN Lockamy, Randi L, NP-C         Discharge Medications: Please see discharge summary for a list of discharge medications.  Relevant Imaging Results:  Relevant Lab Results:   Additional Information HD TRS  Shade Flood, LCSW

## 2019-10-23 ENCOUNTER — Encounter (HOSPITAL_COMMUNITY): Payer: Self-pay | Admitting: Vascular Surgery

## 2019-10-23 DIAGNOSIS — G8929 Other chronic pain: Secondary | ICD-10-CM | POA: Diagnosis not present

## 2019-10-23 DIAGNOSIS — K529 Noninfective gastroenteritis and colitis, unspecified: Secondary | ICD-10-CM | POA: Diagnosis not present

## 2019-10-23 DIAGNOSIS — N186 End stage renal disease: Secondary | ICD-10-CM | POA: Diagnosis not present

## 2019-10-23 DIAGNOSIS — B20 Human immunodeficiency virus [HIV] disease: Secondary | ICD-10-CM | POA: Diagnosis not present

## 2019-10-23 DIAGNOSIS — I480 Paroxysmal atrial fibrillation: Secondary | ICD-10-CM | POA: Diagnosis not present

## 2019-10-23 NOTE — Progress Notes (Signed)
Anesthesia Chart Review: [name]   Case: [id number] Date/Time: 10/26/19 1215   Procedure: CARPAL TUNNEL RELEASE (Right )   Anesthesia type: General   Pre-op diagnosis: RIGHT CARPAL TUNNEL SYNDROME   Location: [place] OR ROOM 06 / [place] [facility]   Surgeons: Leanora Cover, MD      DISCUSSION: Patient is a 69 year old female scheduled for the above procedure.  History includes former smoker (quit 03/12/83), HTN, HIV, ESRD (on hemodialysis, [name]), TIA, atrial flutter (SVT 08/2012, a-flutter 08/2014), pulmonary hypertension, tachycardia, anemia, GERD, generalized muscle weakness, C. Difficile colitis (2013, 07/2019). She has a RUE AVF, s/p drug-coated balloon angioplasty of right innominate stent on 08/28/19 due to RUE swelling (previous drug coated balloon angioplasty of right innominate and subclavian veins 03/29/17 and BMS to right innominate vein on 03/14/18; no longer taking Plavix by medication list).   - Admission 10/20/19-10/22/19 for acute colitis. (Previous admission 07/2019 for C. Difficile colitis treated with oral vancomycin and lobar pneumonia requiring supplemental O2, s/p azithromycin.) Presented with frequent stools and abdominal pain, but no significant stool output during admission so unable to perform stool studies. She was treated with Cipro and Flagyl, and symptoms improved. CT imaging did show dilated common bile duct, thought to be chronic, so consider outpatient MRCP versus CT abdomen with pancreatiac protocol in the future. Nephrology managed hemodialysis. She was transfused 1 unit PRBC for presenting HGB of 6.8 (9.5 post transfusion). She was [facility] back to [facility].  She has an upcoming appointment with Dr. Delton Coombes on 10/25/19. Previously seen on 09/13/19 by Francene Finders, NP for pancytopenia. By notes, ID panel including CMV, EBV, parvo B19, and hepatitis negative. EBV IgG antibody was positive but IgM was negative. She receives Epogen at dialysis.   Last cardiology visit  with Dr. Harrington Challenger was on 08/04/19 for follow-up HTN and aflutter. 2014 cath normal at [facility]. Only noted on documented episode of aflutter in 2016, so Dr. Harrington Challenger did not recommend starting anticoagulation unless recurrence. As needed cardiology follow-up recommended. She is not on medication for pulmonary hypertension. She has a chronically abnormal EKG--abnormal anterolateral T wave abnormality dating back to at least 12/20216.   Reviewed above with anesthesiologist Annye Asa, MD. As needed cardiology follow-up recommended at 08/04/19 visit with Dr. Harrington Challenger. She is on antivirals for HIV. She is on hemodialysis. S/p PRBC on 10/21/19. She was recently treated for recurrent colitis with GI follow-up next month. She is a same day work-up. PAT RN staff to follow-up with patient/SNF staff prior to surgery. If no acute symptoms, then will plan for anesthesia team to evaluate on the day of surgery. Last COVID-19 test was on 10/21/19 (negative).   VS:  BP Readings from Last 3 Encounters:  10/22/19 (!) 154/96  09/29/19 (!) 163/73  09/15/19 (!) 154/83   Pulse Readings from Last 3 Encounters:  10/22/19 70  09/29/19 73  09/15/19 72    PROVIDERS: Hilbert Corrigan, MD Dorris Carnes, MD is cardiologist. Last visit 08/24/19 with as needed follow-up.  Wilfrid Lund, MD is GI. Has hospital follow-up with [name], NP for colitis and dilated CBD on 11/28/19.  Bobby Rumpf, MD is ID. Last visit 03/07/19 with good CD4 count. No change in HIV medications. 9 month follow-up planned.    LABS: As of 10/22/19, labs include:  Lab Results  Component Value Date   WBC 3.9 (L) 10/22/2019   HGB 9.5 (L) 10/22/2019   HCT 31.0 (L) 10/22/2019   PLT 150 10/22/2019  GLUCOSE 59 (L) 10/22/2019   ALT 7 10/22/2019   AST 14 (L) 10/22/2019   NA 130 (L) 10/22/2019   K 3.2 (L) 10/22/2019   CL 92 (L) 10/22/2019   CREATININE 5.36 (H) 10/22/2019   BUN 19 10/22/2019   CO2 27 10/22/2019   Would need ISTAT labs on the day of  surgery given ESRD.   IMAGES: CT abd/pelvis 10/21/19: IMPRESSION: 1. Findings compatible with severe colitis, as above. 2. Chronic dilatation of the common bile duct and pancreatic duct. No frank intrahepatic biliary ductal dilatation. As previously recommended, follow-up nonemergent abdominal MRI with and without IV gadolinium with MRCP is strongly recommended in the near future to exclude the possibility of an obstructing lesion in the ampulla or pancreatic head. 3. Cholelithiasis without evidence of acute cholecystitis. Additionally, no calcified choledocholithiasis is identified. 4. Multiple cystic lesions of varying degrees of complexity, including some indeterminate lesions bilaterally. Attention at time of forthcoming abdominal MRI with and without IV gadolinium with MRCP is recommended to definitively characterize these lesions. 5. Small infraumbilical ventral hernia containing only omental fat. No associated bowel incarceration or obstruction at this time. 6. Aortic atherosclerosis. 7. Cardiomegaly. 8. Additional incidental findings, as above.  1V PCXR 07/29/19: IMPRESSION: 1. Bibasilar pneumonia, LEFT greater than RIGHT. 2. Stable cardiomegaly without evidence of pulmonary edema. 3. Massive enlargement of the central pulmonary arteries indicating pulmonary arterial hypertension, with interval increase in size of the pulmonary arteries since the examination 2 years ago.    EKG: 09/29/19: Sinus rhythm Atrial premature complexes Abnrm T, consider ischemia, anterolateral leads Prolonged QT interval No STEMI Confirmed by Nanda Quinton [phone]) on 09/29/2019 1:53:07 PM - She has had previous anterolateral T wave abnormality, more non-specific on 07/19/19 tracing but inverted T wave on 07/23/17 and 02/21/15 tracings.    CV: Echo 02/21/15: Study Conclusions  - Left ventricle: The cavity size was normal. Wall thickness was  normal. Systolic function was normal. The  estimated ejection  fraction was in the range of 60% to 65%.  - Mitral valve: There was mild regurgitation.  - Right atrium: The atrium was mildly dilated.  - Right ventricle: The cavity size was normal. Wall thickness was     Normal. Systolic function was normal. - Tricuspid valve: Structurally normal. Transvalvular velocity was    Within the normal range. There was no regurgitation.  (Comparison: PA systolic pressure 61 mmHg 25/95/63; PA systolic pressure 40 mmHg 06/14/08; LVEF 60-65%, mild concentric LVH, mildly dilated LA, moderately dilated RV cavity with normal wall thickness, RVSF moderately to severely reduced, RA moderately dilated, PA peak pressure 72 mmHg 01/21/09)   Cardiac cath 09/16/12 ([facility]): DIAGNOSTIC SUMMARY  Coronary Artery Disease   Left Main: normal   LAD system: normal   LCX system: normal   RCA system: normal   No significant CAD indicated     Past Medical History:  Diagnosis Date  . Anemia   . Anxiety   . Atrial flutter ([place]) 02/22/2015  . C. difficile colitis 10/10/11  . Chronic diarrhea   . Depression   . ESRD on hemodialysis ([place])   . GERD (gastroesophageal reflux disease)   . HIV (human immunodeficiency virus infection) ([place])   . Hypertension   . Insomnia   . Intracranial hemorrhage ([place])   . Muscle weakness (generalized)   . Pulmonary HTN ([place])   . Tachycardia   . TIA (transient ischemic attack)   . Traumatic hematoma of right upper arm 02/22/2015    Past Surgical  History:  Procedure Laterality Date  . A/V FISTULAGRAM N/A 04/17/2016   Procedure: A/V Fistulagram - Right Upper;  Surgeon: Waynetta Sandy, MD;  Location: [place] CV LAB;  Service: Cardiovascular;  Laterality: N/A;  . A/V FISTULAGRAM Right 08/28/2019   Procedure: A/V FISTULAGRAM;  Surgeon: Waynetta Sandy, MD;  Location: [place] [facility] LAB;  Service: Cardiovascular;  Laterality: Right;  . BIOPSY THYROID    . Dialysis Shunts      previous one removed from left arm and now present one in right arm  . DIALYSIS/PERMA CATHETER INSERTION Right 04/17/2016   Procedure: dialysis Catheter Insertion central veinous;  Surgeon: Waynetta Sandy, MD;  Location: [place] CV LAB;  Service: Cardiovascular;  Laterality: Right;  . ESOPHAGOGASTRODUODENOSCOPY  12/15/2010   [place]: erosive reflux esophagitis, MW tear, hiatal hernia (small), gastritis with no h.pylori, possibly nsaid related  . EXCISION OF BREAST BIOPSY Right 05/04/2012   Procedure: EXCISION OF BREAST BIOPSY;  Surgeon: Donato Heinz, MD;  Location: [facility];  Service: General;  Laterality: Right;  Right Excisional Breast Biopsy  . FISTULOGRAM Right 03/26/2014   Procedure: Right Arm Fistulogram with Venoplasty Right Subclavian Vein and Inominate Vein. Debridement Fistula Ulcer;  Surgeon: Conrad Speers, MD;  Location: [place];  Service: Vascular;  Laterality: Right;  . FISTULOGRAM Right 03/29/2017   Procedure: FISTULOGRAM COMPLEX RIGHT ARM with Balloon angioplasty;  Surgeon: Waynetta Sandy, MD;  Location: [place];  Service: Vascular;  Laterality: Right;  . IR GENERIC HISTORICAL  05/19/2016   IR REMOVAL TUN CV CATH W/O FL 05/19/2016 Markus Daft, MD [facility]-INTER[facility] RAD  . PERIPHERAL VASCULAR BALLOON ANGIOPLASTY Right 04/17/2016   Procedure: Peripheral Vascular Balloon Angioplasty;  Surgeon: Waynetta Sandy, MD;  Location: [place] [facility];  Service: Cardiovascular;  Laterality: Right;  Central segment AV Fistula  . PERIPHERAL VASCULAR BALLOON ANGIOPLASTY Right 03/14/2018   Procedure: PERIPHERAL VASCULAR BALLOON ANGIOPLASTY;  Surgeon: Waynetta Sandy, MD;  Location: [place] [facility];  Service: Cardiovascular;  Laterality: Right;  subclavian vein  . PERIPHERAL VASCULAR BALLOON ANGIOPLASTY Right 08/28/2019   Procedure: PERIPHERAL VASCULAR BALLOON ANGIOPLASTY;  Surgeon: Waynetta Sandy, MD;  Location: [place] [facility];  Service: Cardiovascular;   Laterality: Right;  upper arm  . PERIPHERAL VASCULAR INTERVENTION Right 03/14/2018   Procedure: PERIPHERAL VASCULAR INTERVENTION;  Surgeon: Waynetta Sandy, MD;  Location: [place] [facility] LAB;  Service: Cardiovascular;  Laterality: Right;  subclavian vein  . SHUNTOGRAM Right 10/19/2013   Procedure: FISTULOGRAM;  Surgeon: Conrad [place], MD;  Location: [facility] CATH LAB;  Service: Cardiovascular;  Laterality: Right;  . TONSILLECTOMY    . VISCERAL VENOGRAPHY Right 03/14/2018   Procedure: CENTRAL VENOGRAPHY;  Surgeon: Waynetta Sandy, MD;  Location: [place] [facility] LAB;  Service: Cardiovascular;  Laterality: Right;  upper ext fistula    MEDICATIONS: No current facility-administered medications for this encounter.   Marland Kitchen acetaminophen (TYLENOL) 650 MG CR tablet  . Amino Acids-Protein Hydrolys (FEEDING SUPPLEMENT, PRO-STAT SUGAR FREE 64,) LIQD  . Biotin 5000 MCG CAPS  . calcium acetate, Phos Binder, (PHOSLYRA) 667 MG/5ML SOLN  . calcium carbonate (TUMS - DOSED IN MG ELEMENTAL CALCIUM) 500 MG chewable tablet  . ciprofloxacin (CIPRO) 250 MG tablet  . clopidogrel (PLAVIX) 75 MG tablet  . cyanocobalamin 1000 MCG tablet  . dolutegravir (TIVICAY) 50 MG tablet  . escitalopram (LEXAPRO) 10 MG tablet  . fentaNYL (DURAGESIC) 50 MCG/HR  . lamivudine (EPIVIR) 100 MG tablet  . lanolin/mineral oil (  KERI/THERA-DERM) LOTN  . Lidocaine HCl (ASPERCREME W/LIDOCAINE) 4 % CREA  . methadone (DOLOPHINE) 5 MG tablet  . metroNIDAZOLE (FLAGYL) 500 MG tablet  . mirtazapine (REMERON) 15 MG tablet  . Multiple Vitamin (MULTIVITAMIN WITH MINERALS) TABS tablet  . nitroGLYCERIN (NITROSTAT) 0.4 MG SL tablet  . omeprazole (PRILOSEC) 20 MG capsule  . ondansetron (ZOFRAN-ODT) 4 MG disintegrating tablet  . Oxycodone HCl 10 MG TABS  . OXYGEN  . promethazine (PHENERGAN) 12.5 MG tablet  . sertraline (ZOLOFT) 100 MG tablet  . sertraline (ZOLOFT) 25 MG tablet  . traZODone (DESYREL) 50 MG tablet  . trolamine salicylate  (ASPERCREME) 10 % cream  . vitamin C (ASCORBIC ACID) 500 MG tablet  . zidovudine (RETROVIR) 100 MG capsule  . Zinc 220 (50 Zn) MG CAPS   . 0.9 %  sodium chloride infusion (Manually program via Guardrails IV Fluids)  . acetaminophen (TYLENOL) tablet 650 mg  . diphenhydrAMINE (BENADRYL) capsule 25 mg  . sodium chloride flush (NS) 0.9 % injection 10 mL   Based on medication list, she is not currently taking acetaminophen, Plavix, Nitro, Zofran-ODT, home oxygen, Zoloft 25 mg or 100 mg, Aspercreme.    Myra Gianotti, PA-C Surgical Short Stay/Anesthesiology [facility] Phone [phone] [facility] Phone [phone] 10/24/2019 6:50 PM

## 2019-10-24 DIAGNOSIS — N2581 Secondary hyperparathyroidism of renal origin: Secondary | ICD-10-CM | POA: Diagnosis not present

## 2019-10-24 DIAGNOSIS — Z992 Dependence on renal dialysis: Secondary | ICD-10-CM | POA: Diagnosis not present

## 2019-10-24 DIAGNOSIS — D631 Anemia in chronic kidney disease: Secondary | ICD-10-CM | POA: Diagnosis not present

## 2019-10-24 DIAGNOSIS — D509 Iron deficiency anemia, unspecified: Secondary | ICD-10-CM | POA: Diagnosis not present

## 2019-10-24 DIAGNOSIS — D472 Monoclonal gammopathy: Secondary | ICD-10-CM | POA: Diagnosis not present

## 2019-10-24 DIAGNOSIS — N186 End stage renal disease: Secondary | ICD-10-CM | POA: Diagnosis not present

## 2019-10-24 NOTE — Anesthesia Preprocedure Evaluation (Deleted)
Anesthesia Evaluation    Airway        Dental   Pulmonary former smoker,           Cardiovascular hypertension,      Neuro/Psych    GI/Hepatic   Endo/Other    Renal/GU      Musculoskeletal   Abdominal   Peds  Hematology   Anesthesia Other Findings   Reproductive/Obstetrics                             Anesthesia Physical Anesthesia Plan  ASA:   Anesthesia Plan:    Post-op Pain Management:    Induction:   PONV Risk Score and Plan:   Airway Management Planned:   Additional Equipment:   Intra-op Plan:   Post-operative Plan:   Informed Consent:   Plan Discussed with:   Anesthesia Plan Comments: (PAT note written by Myra Gianotti, PA-C. SAME DAY WORK-UP   )        Anesthesia Quick Evaluation

## 2019-10-25 ENCOUNTER — Encounter (HOSPITAL_COMMUNITY): Payer: Self-pay | Admitting: Hematology

## 2019-10-25 ENCOUNTER — Inpatient Hospital Stay (HOSPITAL_COMMUNITY): Payer: Medicare Other | Attending: Hematology | Admitting: Hematology

## 2019-10-25 ENCOUNTER — Other Ambulatory Visit: Payer: Self-pay

## 2019-10-25 VITALS — BP 168/79 | HR 67 | Temp 97.1°F | Resp 18 | Wt 100.2 lb

## 2019-10-25 DIAGNOSIS — D539 Nutritional anemia, unspecified: Secondary | ICD-10-CM | POA: Insufficient documentation

## 2019-10-25 DIAGNOSIS — Z992 Dependence on renal dialysis: Secondary | ICD-10-CM | POA: Insufficient documentation

## 2019-10-25 DIAGNOSIS — D61818 Other pancytopenia: Secondary | ICD-10-CM | POA: Diagnosis not present

## 2019-10-25 DIAGNOSIS — E538 Deficiency of other specified B group vitamins: Secondary | ICD-10-CM

## 2019-10-25 DIAGNOSIS — Z21 Asymptomatic human immunodeficiency virus [HIV] infection status: Secondary | ICD-10-CM | POA: Diagnosis not present

## 2019-10-25 DIAGNOSIS — N186 End stage renal disease: Secondary | ICD-10-CM | POA: Diagnosis not present

## 2019-10-25 DIAGNOSIS — Z79899 Other long term (current) drug therapy: Secondary | ICD-10-CM | POA: Diagnosis not present

## 2019-10-25 MED ORDER — CYANOCOBALAMIN 1000 MCG/ML IJ SOLN: 1000.0000 ug | INTRAMUSCULAR | 12 refills | Status: DC

## 2019-10-25 NOTE — Progress Notes (Signed)
Notified [name] at Dr. Levell July office that a rapid covid test would not be able to be completed on 10/26/19 due to routine machine maintenance.  Also, asked for Plavix instructions and orders.

## 2019-10-25 NOTE — Progress Notes (Signed)
[place], [place] [address]   CLINIC:  Medical Oncology/Hematology  PCP:  Hilbert Corrigan, MD [address] [place] [address]  [phone]  REASON FOR VISIT:  Follow-up for pancytopenia  PRIOR THERAPY: None  CURRENT THERAPY: Intermittent blood transfusions and cyanocobalamin daily  INTERVAL HISTORY:  [name], a 69 y.o. female, returns for routine follow-up for her pancytopenia. [name] was last seen on 01/10/2019. She was taken to [facility] on 8/20 and hospitalized for abdominal pain with diarrhea; she received 2 units of PRBC's on 8/21.  Today she is accompanied by her nurse. She reports that she felt a little better since getting the transfusion. She continue getting Epogen with her dialysis. She reports having melena and has an appointment scheduled with GI. Her appetite is very low.   REVIEW OF SYSTEMS:  Review of Systems  Constitutional: Positive for appetite change (depleted) and fatigue (depleted).  Respiratory: Positive for shortness of breath (occasional).   Gastrointestinal: Positive for constipation, diarrhea, nausea and vomiting.  Musculoskeletal: Positive for arthralgias (10/10 pain in both hands).  All other systems reviewed and are negative.   PAST MEDICAL/SURGICAL HISTORY:  Past Medical History:  Diagnosis Date  . Anemia   . Anxiety   . Atrial flutter ([facility]) 02/22/2015  . C. difficile colitis 10/10/11  . Chronic diarrhea   . Depression   . ESRD on hemodialysis ([facility])   . GERD (gastroesophageal reflux disease)   . HIV (human immunodeficiency virus infection) ([facility])   . Hypertension   . Insomnia   . Intracranial hemorrhage ([facility])   . Muscle weakness (generalized)   . Pulmonary HTN ([facility])   . Tachycardia   . TIA (transient ischemic attack)   . Traumatic hematoma of right upper arm 02/22/2015   Past Surgical History:  Procedure Laterality Date  . A/V FISTULAGRAM N/A 04/17/2016   Procedure: A/V  Fistulagram - Right Upper;  Surgeon: Waynetta Sandy, MD;  Location: [facility];  Service: Cardiovascular;  Laterality: N/A;  . A/V FISTULAGRAM Right 08/28/2019   Procedure: A/V FISTULAGRAM;  Surgeon: Waynetta Sandy, MD;  Location: [facility];  Service: Cardiovascular;  Laterality: Right;  . BIOPSY THYROID    . Dialysis Shunts     previous one removed from left arm and now present one in right arm  . DIALYSIS/PERMA CATHETER INSERTION Right 04/17/2016   Procedure: dialysis Catheter Insertion central veinous;  Surgeon: Waynetta Sandy, MD;  Location: [facility];  Service: Cardiovascular;  Laterality: Right;  . ESOPHAGOGASTRODUODENOSCOPY  12/15/2010   [place]: erosive reflux esophagitis, MW tear, hiatal hernia (small), gastritis with no h.pylori, possibly nsaid related  . EXCISION OF BREAST BIOPSY Right 05/04/2012   Procedure: EXCISION OF BREAST BIOPSY;  Surgeon: Donato Heinz, MD;  Location: [facility];  Service: General;  Laterality: Right;  Right Excisional Breast Biopsy  . FISTULOGRAM Right 03/26/2014   Procedure: Right Arm Fistulogram with Venoplasty Right Subclavian Vein and Inominate Vein. Debridement Fistula Ulcer;  Surgeon: Conrad Hollywood, MD;  Location: [facility];  Service: Vascular;  Laterality: Right;  . FISTULOGRAM Right 03/29/2017   Procedure: FISTULOGRAM COMPLEX RIGHT ARM with Balloon angioplasty;  Surgeon: Waynetta Sandy, MD;  Location: [facility];  Service: Vascular;  Laterality: Right;  . IR GENERIC HISTORICAL  05/19/2016   IR REMOVAL TUN CV CATH W/O FL 05/19/2016 Markus Daft, MD [facility]  . PERIPHERAL VASCULAR BALLOON ANGIOPLASTY Right 04/17/2016  Procedure: Peripheral Vascular Balloon Angioplasty;  Surgeon: Waynetta Sandy, MD;  Location: [facility];  Service: Cardiovascular;  Laterality: Right;  Central segment AV Fistula  . PERIPHERAL VASCULAR BALLOON ANGIOPLASTY Right 03/14/2018   Procedure: PERIPHERAL VASCULAR  BALLOON ANGIOPLASTY;  Surgeon: Waynetta Sandy, MD;  Location: [facility];  Service: Cardiovascular;  Laterality: Right;  subclavian vein  . PERIPHERAL VASCULAR BALLOON ANGIOPLASTY Right 08/28/2019   Procedure: PERIPHERAL VASCULAR BALLOON ANGIOPLASTY;  Surgeon: Waynetta Sandy, MD;  Location: [facility];  Service: Cardiovascular;  Laterality: Right;  upper arm  . PERIPHERAL VASCULAR INTERVENTION Right 03/14/2018   Procedure: PERIPHERAL VASCULAR INTERVENTION;  Surgeon: Waynetta Sandy, MD;  Location: [facility];  Service: Cardiovascular;  Laterality: Right;  subclavian vein  . SHUNTOGRAM Right 10/19/2013   Procedure: FISTULOGRAM;  Surgeon: Conrad Clarkton, MD;  Location: [facility];  Service: Cardiovascular;  Laterality: Right;  . TONSILLECTOMY    . VISCERAL VENOGRAPHY Right 03/14/2018   Procedure: CENTRAL VENOGRAPHY;  Surgeon: Waynetta Sandy, MD;  Location: [facility];  Service: Cardiovascular;  Laterality: Right;  upper ext fistula    SOCIAL HISTORY:  Social History   Socioeconomic History  . Marital status: Widowed    Spouse name: Not on file  . Number of children: 1  . Years of education: Not on file  . Highest education level: Not on file  Occupational History  . Not on file  Tobacco Use  . Smoking status: Former Smoker    Quit date: 03/12/1983    Years since quitting: 36.6  . Smokeless tobacco: Never Used  Vaping Use  . Vaping Use: Never used  Substance and Sexual Activity  . Alcohol use: No    Alcohol/week: 0.0 standard drinks  . Drug use: No  . Sexual activity: Not Currently  Other Topics Concern  . Not on file  Social History Narrative  . Not on file   Social Determinants of Health   Financial Resource Strain:   . Difficulty of Paying Living Expenses: Not on file  Food Insecurity:   . Worried About Charity fundraiser in the Last Year: Not on file  . Ran Out of Food in the Last Year: Not on file    Transportation Needs:   . Lack of Transportation (Medical): Not on file  . Lack of Transportation (Non-Medical): Not on file  Physical Activity:   . Days of Exercise per Week: Not on file  . Minutes of Exercise per Session: Not on file  Stress:   . Feeling of Stress : Not on file  Social Connections:   . Frequency of Communication with Friends and Family: Not on file  . Frequency of Social Gatherings with Friends and Family: Not on file  . Attends Religious Services: Not on file  . Active Member of Clubs or Organizations: Not on file  . Attends Archivist Meetings: Not on file  . Marital Status: Not on file  Intimate Partner Violence:   . Fear of Current or Ex-Partner: Not on file  . Emotionally Abused: Not on file  . Physically Abused: Not on file  . Sexually Abused: Not on file    FAMILY HISTORY:  Family History  Problem Relation Age of Onset  . Anesthesia problems Neg Hx   . Hypotension Neg Hx   . Malignant hyperthermia Neg Hx   . Pseudochol deficiency Neg Hx     CURRENT MEDICATIONS:  Current  Outpatient Medications  Medication Sig Dispense Refill  . acetaminophen (TYLENOL) 650 MG CR tablet Take 650 mg by mouth every 8 (eight) hours as needed for pain.  (Patient not taking: Reported on 09/29/2019)    . Amino Acids-Protein Hydrolys (FEEDING SUPPLEMENT, PRO-STAT SUGAR FREE 64,) LIQD Take 30 mLs by mouth daily. (WOUND HEALING) (0800)    . Biotin 5000 MCG CAPS Take 5,000 mcg by mouth daily. (0800)    . calcium acetate, Phos Binder, (PHOSLYRA) 667 MG/5ML SOLN Take 2,001 mg by mouth 3 (three) times daily with meals. (0800, 1200, & 2000)    . calcium elemental as carbonate (BARIATRIC TUMS ULTRA) 400 MG chewable tablet Chew 1,000 mg by mouth in the morning and at bedtime. (0800 & 2000)    . ciprofloxacin (CIPRO) 250 MG tablet Take 1 tablet (250 mg total) by mouth 2 (two) times daily for 10 days. 20 tablet 0  . clopidogrel (PLAVIX) 75 MG tablet Take 1 tablet (75 mg total)  by mouth daily. (Patient taking differently: Take 75 mg by mouth daily. (0800)) 30 tablet 3  . cyanocobalamin 1000 MCG tablet Take 1,000 mcg by mouth daily.    . dolutegravir (TIVICAY) 50 MG tablet Take 50 mg by mouth daily at 4 PM.     . escitalopram (LEXAPRO) 10 MG tablet Take 10 mg by mouth daily.    . fentaNYL (DURAGESIC) 50 MCG/HR Place 1 patch onto the skin every 3 (three) days.    Marland Kitchen lamivudine (EPIVIR) 100 MG tablet Take 50 mg by mouth daily. (0800)    . lanolin/mineral oil (KERI/THERA-DERM) LOTN Apply 1 application topically every 12 (twelve) hours as needed for dry skin.     . Lidocaine HCl (ASPERCREME W/LIDOCAINE) 4 % CREA Apply 1 application topically every 12 (twelve) hours as needed (to bilateral wrists for pain).     . methadone (DOLOPHINE) 5 MG tablet Take 2.5 mg by mouth daily at 8 pm.     . metroNIDAZOLE (FLAGYL) 500 MG tablet Take 1 tablet (500 mg total) by mouth 3 (three) times daily for 10 days. 30 tablet 0  . mirtazapine (REMERON) 15 MG tablet Take 15 mg by mouth at bedtime. (2000)    . Multiple Vitamin (MULTIVITAMIN WITH MINERALS) TABS tablet Take 1 tablet by mouth at bedtime. (2000)    . nitroGLYCERIN (NITROSTAT) 0.4 MG SL tablet Place 0.4 mg under the tongue every 5 (five) minutes as needed for chest pain.     Marland Kitchen omeprazole (PRILOSEC) 20 MG capsule Take 40 mg by mouth daily at 6 (six) AM.     . ondansetron (ZOFRAN-ODT) 4 MG disintegrating tablet Take 4 mg by mouth every 8 (eight) hours as needed for nausea or vomiting. (Patient not taking: Reported on 09/29/2019)    . Oxycodone HCl 10 MG TABS Take 1 tablet (10 mg total) by mouth every 12 (twelve) hours as needed (severe pain). 5 tablet 0  . OXYGEN Inhale 2 L into the lungs as needed. (Patient not taking: Reported on 10/22/2019)    . promethazine (PHENERGAN) 12.5 MG tablet Take 12.5 mg by mouth See admin instructions. Take 12.5 mg by mouth in the morning every Tuesday, Thursday and Saturday for nausea/vomiting and every 6 hours  as needed.    . sertraline (ZOLOFT) 100 MG tablet Take 200 mg by mouth daily. (0900) (Patient not taking: Reported on 10/24/2019)    . sertraline (ZOLOFT) 25 MG tablet Take 100 mg by mouth at bedtime. (2000) (Patient not taking: Reported on  10/22/2019)    . traZODone (DESYREL) 50 MG tablet Take 50 mg by mouth at bedtime. (2100)    . trolamine salicylate (ASPERCREME) 10 % cream Apply 1 application topically as needed for muscle pain.     . vitamin C (ASCORBIC ACID) 500 MG tablet Take 500 mg by mouth 2 (two) times daily. (0900 & 2100)    . zidovudine (RETROVIR) 100 MG capsule Take 100 mg by mouth 3 (three) times daily. (0900, 1300, & 2100)    . Zinc 220 (50 Zn) MG CAPS Take 220 mg by mouth daily. (0800)     No current facility-administered medications for this visit.   Facility-Administered Medications Ordered in Other Visits  Medication Dose Route Frequency Provider Last Rate Last Admin  . 0.9 %  sodium chloride infusion (Manually program via Guardrails IV Fluids)  250 mL Intravenous Once Lockamy, Randi L, NP-C      . acetaminophen (TYLENOL) tablet 650 mg  650 mg Oral Once Lockamy, Randi L, NP-C      . diphenhydrAMINE (BENADRYL) capsule 25 mg  25 mg Oral Once Lockamy, Randi L, NP-C      . sodium chloride flush (NS) 0.9 % injection 10 mL  10 mL Intracatheter PRN Lockamy, Randi L, NP-C        ALLERGIES:  Allergies  Allergen Reactions  . Lactose Intolerance (Gi) Other (See Comments)    On MAR  . Latex Rash and Itching  . Penicillins Rash and Swelling    Has patient had a PCN reaction causing immediate rash, facial/tongue/throat swelling, SOB or lightheadedness with hypotension: No Has patient had a PCN reaction causing severe rash involving mucus membranes or skin necrosis: No Has patient had a PCN reaction that required hospitalization No Has patient had a PCN reaction occurring within the last 10 years: No If all of the above answers are "NO", then may proceed with Cephalosporin use.       PHYSICAL EXAM:  Performance status (ECOG): 1 - Symptomatic but completely ambulatory  There were no vitals filed for this visit. Wt Readings from Last 3 Encounters:  10/22/19 101 lb 3.1 oz (45.9 kg)  09/29/19 (!) 94 lb 12.8 oz (43 kg)  09/13/19 95 lb 10.9 oz (43.4 kg)   Physical Exam Vitals reviewed.  Constitutional:      Appearance: Normal appearance.  Cardiovascular:     Rate and Rhythm: Normal rate and regular rhythm.     Pulses: Normal pulses.     Heart sounds: Normal heart sounds.  Pulmonary:     Effort: Pulmonary effort is normal.     Breath sounds: Normal breath sounds.  Musculoskeletal:     Right lower leg: No edema.     Left lower leg: No edema.  Neurological:     General: No focal deficit present.     Mental Status: She is alert and oriented to person, place, and time. Mental status is at baseline.  Psychiatric:        Mood and Affect: Mood normal.        Behavior: Behavior normal.     LABORATORY DATA:  I have reviewed the labs as listed.  CBC Latest Ref Rng & Units 10/22/2019 10/20/2019 10/18/2019  WBC 4.0 - 10.5 K/uL 3.9(L) 5.1 3.2(L)  Hemoglobin 12.0 - 15.0 g/dL 9.5(L) 6.8(LL) 7.1(L)  Hematocrit 36 - 46 % 31.0(L) 23.0(L) 24.5(L)  Platelets 150 - 400 K/uL 150 168 144(L)   CMP Latest Ref Rng & Units 10/22/2019 10/20/2019 10/18/2019  Glucose  70 - 99 mg/dL 59(L) 92 94  BUN 8 - 23 mg/dL 19 52(H) 32(H)  Creatinine 0.44 - 1.00 mg/dL 5.36(H) 10.99(H) 6.15(H)  Sodium 135 - 145 mmol/L 130(L) 136 139  Potassium 3.5 - 5.1 mmol/L 3.2(L) 4.0 3.8  Chloride 98 - 111 mmol/L 92(L) 97(L) 99  CO2 22 - 32 mmol/L 27 24 28   Calcium 8.9 - 10.3 mg/dL 9.4 9.2 9.5  Total Protein 6.5 - 8.1 g/dL 7.4 7.9 8.1  Total Bilirubin 0.3 - 1.2 mg/dL 0.8 0.6 0.4  Alkaline Phos 38 - 126 U/L 119 131(H) 139(H)  AST 15 - 41 U/L 14(L) 13(L) 15  ALT 0 - 44 U/L 7 8 9       Component Value Date/Time   RBC 2.98 (L) 10/22/2019 0105   MCV 104.0 (H) 10/22/2019 0105   MCH 31.9 10/22/2019 0105    MCHC 30.6 10/22/2019 0105   RDW 20.2 (H) 10/22/2019 0105   LYMPHSABS 0.6 (L) 10/18/2019 0941   MONOABS 0.3 10/18/2019 0941   EOSABS 0.1 10/18/2019 0941   BASOSABS 0.0 10/18/2019 0941   Lab Results  Component Value Date   VD25OH 21.58 (L) 10/18/2019   VD25OH 10.65 (L) 09/06/2019   VD25OH 9.23 (L) 07/12/2019   Lab Results  Component Value Date   TIBC 161 (L) 10/18/2019   TIBC 168 (L) 09/06/2019   TIBC 157 (L) 07/12/2019   FERRITIN 555 (H) 10/18/2019   FERRITIN 998 (H) 09/06/2019   FERRITIN 1,004 (H) 07/12/2019   IRONPCTSAT 19 10/18/2019   IRONPCTSAT 24 09/06/2019   IRONPCTSAT 33 (H) 07/12/2019    DIAGNOSTIC IMAGING:  I have independently reviewed the scans and discussed with the patient. CT Abdomen Pelvis W Contrast  Result Date: 10/21/2019 CLINICAL DATA:  69 year old female with history of abdominal pain. Suspected abdominal hernia. EXAM: CT ABDOMEN AND PELVIS WITH CONTRAST TECHNIQUE: Multidetector CT imaging of the abdomen and pelvis was performed using the standard protocol following bolus administration of intravenous contrast. CONTRAST:  149mL OMNIPAQUE IOHEXOL 300 MG/ML  SOLN COMPARISON:  CT the abdomen and pelvis 07/19/2019. FINDINGS: Lower chest: Cardiomegaly. Hepatobiliary: No suspicious cystic or solid hepatic lesions. No intrahepatic biliary ductal dilatation. 5 mm calcified gallstone lying dependently in the gallbladder. Common bile duct is dilated measuring 14 mm in the porta hepatis, similar to the prior examination. No calcified stone identified in the common bile duct. No findings to suggest an acute cholecystitis at this time. Pancreas: No pancreatic mass. Persistent pancreatic ductal dilatation measuring up to 7 mm in the pancreatic head, similar to the prior study from a 07/19/2019. No pancreatic or peripancreatic fluid collections or inflammatory changes. Spleen: Unremarkable. Adrenals/Urinary Tract: Severe atrophy of the kidneys bilaterally. Numerous small renal  lesions bilaterally ranging from low to high attenuation, many of which are subcentimeter in size and too small to characterize, but favored to represent cysts and proteinaceous/hemorrhagic cyst. Several indeterminate lesions are also noted, most notable for a 1.4 cm lesion measuring 59 HU in the lateral aspect of the interpolar region of the right kidney (axial image 23 of series 2). Multiple tiny calcifications are present in both renal collecting systems measuring up to 3 mm in the lower pole collecting system of the left kidney. No hydroureteronephrosis. Urinary bladder is nearly completely decompressed, but otherwise unremarkable in appearance. Bilateral adrenal glands are normal in appearance. Stomach/Bowel: Normal appearance of the stomach. No pathologic dilatation of small bowel or colon. Severe mural thickening of the rectum and colon where there is also some mucosal  hyperenhancement and some hypervascularity in the associated mesocolon, indicative of severe colitis. The appendix is not confidently identified and may be surgically absent. Regardless, there are no inflammatory changes noted adjacent to the cecum to suggest the presence of an acute appendicitis at this time. Vascular/Lymphatic: Aortic atherosclerosis, without evidence of aneurysm or dissection in the abdominal or pelvic vasculature. No lymphadenopathy noted in the abdomen or pelvis. Reproductive: Prostate gland and seminal vesicles are unremarkable in appearance. Other: Small infraumbilical ventral hernia containing only omental fat (neck visualized on axial image 56 of series 2). No significant volume of ascites. No pneumoperitoneum. Musculoskeletal: Diffuse sclerotic changes in the visualized bones with coarsened trabeculations, presumably reflective of renal osteodystrophy. No definite aggressive appearing lytic or blastic lesions are noted in the visualized portions of the skeleton. IMPRESSION: 1. Findings compatible with severe colitis,  as above. 2. Chronic dilatation of the common bile duct and pancreatic duct. No frank intrahepatic biliary ductal dilatation. As previously recommended, follow-up nonemergent abdominal MRI with and without IV gadolinium with MRCP is strongly recommended in the near future to exclude the possibility of an obstructing lesion in the ampulla or pancreatic head. 3. Cholelithiasis without evidence of acute cholecystitis. Additionally, no calcified choledocholithiasis is identified. 4. Multiple cystic lesions of varying degrees of complexity, including some indeterminate lesions bilaterally. Attention at time of forthcoming abdominal MRI with and without IV gadolinium with MRCP is recommended to definitively characterize these lesions. 5. Small infraumbilical ventral hernia containing only omental fat. No associated bowel incarceration or obstruction at this time. 6. Aortic atherosclerosis. 7. Cardiomegaly. 8. Additional incidental findings, as above. Electronically Signed   By: Vinnie Langton M.D.   On: 10/21/2019 04:35     ASSESSMENT:  1.  Pancytopenia: -CBC on 09/20/2018 showed white count 2.6, hemoglobin 9.4 and MCV 100, platelet count 128.  Differential showed 44% neutrophils. -Repeat CBC in our office on 12/08/2018 shows white count 3.4 with 60% neutrophils.  Hemoglobin improved to 10.8 and platelet count was normal at 172. -B12 was normal with elevated methylmalonic acid of 700.  I have given a B12 shot today in the office.  She was told to take B12 1 mg tablet daily. -We have done infectious disease panel including CMV, EBV and parvo B19 and hepatitis panel which were negative.  EBV IgG antibody was positive but IgM was negative. -I have recommended follow-up in 2 months with repeat labs and B12 levels.  2.  HIV: -She is on Epivir 50 mg daily, Retrovir 100 mg 3 times a day and Tivicay 50 mg daily. -CD4 count and viral load are done periodically by infectious disease.  3.  ESRD: -She undergoes  hemodialysis on Mondays, Wednesdays and Fridays.  4.  Chronic pain: -She is on fentanyl 50 mcg and oxycodone 10 mg twice daily as needed.   PLAN:  1.  Macrocytic anemia: -Found to have hemoglobin of 6.8 on 10/20/2019 followed by 2 units of PRBC.  MCV was 113. -CBC on 10/22/2019 shows hemoglobin 9.5 with MCV of 104. -Continue erythropoiesis stimulating agent and iron as needed with dialysis. -Labs on 10/18/2019 shows ferritin 555 and percent saturation of 19. -Reevaluate in 4 months with labs.  2.  B12 deficiency: -Labs from 10/18/2019 shows normal B12 with elevated methylmalonic acid. -I have recommended B12 injections monthly at her nursing facility.  3.  Chronic pain: -Continue fentanyl and oxycodone.   Orders placed this encounter:  No orders of the defined types were placed in this encounter.  Derek Jack, MD [place] [phone]   I, Milinda Antis, am acting as a scribe for Dr. Sanda Linger.  I, Derek Jack MD, have reviewed the above documentation for accuracy and completeness, and I agree with the above.

## 2019-10-25 NOTE — Patient Instructions (Signed)
[place] at [facility] Discharge Instructions  You were seen today by Dr. Delton Coombes. He went over your recent results. You will start receiving vitamin B12 shots every month. Keep your gastroenterology visit. You next visit will be in 4 months with the nurse practitioner for labs and follow up.   Thank you for choosing [facility] at [facility] to provide your oncology and hematology care.  To afford each patient quality time with our provider, please arrive at least 15 minutes before your scheduled appointment time.   If you have a lab appointment with the [facility] please come in thru the Main Entrance and check in at the main information desk  You need to re-schedule your appointment should you arrive 10 or more minutes late.  We strive to give you quality time with our providers, and arriving late affects you and other patients whose appointments are after yours.  Also, if you no show three or more times for appointments you may be dismissed from the clinic at the providers discretion.     Again, thank you for choosing [facility].  Our hope is that these requests will decrease the amount of time that you wait before being seen by our physicians.       _____________________________________________________________  Should you have questions after your visit to [facility], please contact our office at [phone] between the hours of 8:00 a.m. and 4:30 p.m.  Voicemails left after 4:00 p.m. will not be returned until the following business day.  For prescription refill requests, have your pharmacy contact our office and allow 72 hours.    Cancer Center Support Programs:   > Cancer Support Group  2nd Tuesday of the month 1pm-2pm, Journey Room

## 2019-10-26 ENCOUNTER — Ambulatory Visit (HOSPITAL_COMMUNITY): Admission: RE | Admit: 2019-10-26 | Payer: Medicare Other | Source: Home / Self Care | Admitting: Orthopedic Surgery

## 2019-10-26 DIAGNOSIS — Z992 Dependence on renal dialysis: Secondary | ICD-10-CM | POA: Diagnosis not present

## 2019-10-26 DIAGNOSIS — N186 End stage renal disease: Secondary | ICD-10-CM | POA: Diagnosis not present

## 2019-10-26 DIAGNOSIS — D631 Anemia in chronic kidney disease: Secondary | ICD-10-CM | POA: Diagnosis not present

## 2019-10-26 DIAGNOSIS — D472 Monoclonal gammopathy: Secondary | ICD-10-CM | POA: Diagnosis not present

## 2019-10-26 DIAGNOSIS — N2581 Secondary hyperparathyroidism of renal origin: Secondary | ICD-10-CM | POA: Diagnosis not present

## 2019-10-26 DIAGNOSIS — D509 Iron deficiency anemia, unspecified: Secondary | ICD-10-CM | POA: Diagnosis not present

## 2019-10-26 SURGERY — CARPAL TUNNEL RELEASE
Anesthesia: General | Laterality: Right

## 2019-10-27 DIAGNOSIS — R278 Other lack of coordination: Secondary | ICD-10-CM | POA: Diagnosis not present

## 2019-10-27 DIAGNOSIS — G5601 Carpal tunnel syndrome, right upper limb: Secondary | ICD-10-CM | POA: Diagnosis not present

## 2019-10-27 DIAGNOSIS — M25531 Pain in right wrist: Secondary | ICD-10-CM | POA: Diagnosis not present

## 2019-10-28 DIAGNOSIS — D631 Anemia in chronic kidney disease: Secondary | ICD-10-CM | POA: Diagnosis not present

## 2019-10-28 DIAGNOSIS — N2581 Secondary hyperparathyroidism of renal origin: Secondary | ICD-10-CM | POA: Diagnosis not present

## 2019-10-28 DIAGNOSIS — Z992 Dependence on renal dialysis: Secondary | ICD-10-CM | POA: Diagnosis not present

## 2019-10-28 DIAGNOSIS — N186 End stage renal disease: Secondary | ICD-10-CM | POA: Diagnosis not present

## 2019-10-28 DIAGNOSIS — D472 Monoclonal gammopathy: Secondary | ICD-10-CM | POA: Diagnosis not present

## 2019-10-30 DIAGNOSIS — G5601 Carpal tunnel syndrome, right upper limb: Secondary | ICD-10-CM | POA: Diagnosis not present

## 2019-10-30 DIAGNOSIS — M25531 Pain in right wrist: Secondary | ICD-10-CM | POA: Diagnosis not present

## 2019-10-30 DIAGNOSIS — R278 Other lack of coordination: Secondary | ICD-10-CM | POA: Diagnosis not present

## 2019-10-31 ENCOUNTER — Other Ambulatory Visit: Payer: Self-pay | Admitting: Orthopedic Surgery

## 2019-10-31 DIAGNOSIS — N2581 Secondary hyperparathyroidism of renal origin: Secondary | ICD-10-CM | POA: Diagnosis not present

## 2019-10-31 DIAGNOSIS — D509 Iron deficiency anemia, unspecified: Secondary | ICD-10-CM | POA: Diagnosis not present

## 2019-10-31 DIAGNOSIS — N186 End stage renal disease: Secondary | ICD-10-CM | POA: Diagnosis not present

## 2019-10-31 DIAGNOSIS — D631 Anemia in chronic kidney disease: Secondary | ICD-10-CM | POA: Diagnosis not present

## 2019-10-31 DIAGNOSIS — D472 Monoclonal gammopathy: Secondary | ICD-10-CM | POA: Diagnosis not present

## 2019-10-31 DIAGNOSIS — Z992 Dependence on renal dialysis: Secondary | ICD-10-CM | POA: Diagnosis not present

## 2019-11-01 DIAGNOSIS — M25531 Pain in right wrist: Secondary | ICD-10-CM | POA: Diagnosis not present

## 2019-11-01 DIAGNOSIS — G5601 Carpal tunnel syndrome, right upper limb: Secondary | ICD-10-CM | POA: Diagnosis not present

## 2019-11-01 DIAGNOSIS — R278 Other lack of coordination: Secondary | ICD-10-CM | POA: Diagnosis not present

## 2019-11-02 DIAGNOSIS — N2581 Secondary hyperparathyroidism of renal origin: Secondary | ICD-10-CM | POA: Diagnosis not present

## 2019-11-02 DIAGNOSIS — N186 End stage renal disease: Secondary | ICD-10-CM | POA: Diagnosis not present

## 2019-11-02 DIAGNOSIS — D509 Iron deficiency anemia, unspecified: Secondary | ICD-10-CM | POA: Diagnosis not present

## 2019-11-02 DIAGNOSIS — D631 Anemia in chronic kidney disease: Secondary | ICD-10-CM | POA: Diagnosis not present

## 2019-11-02 DIAGNOSIS — D472 Monoclonal gammopathy: Secondary | ICD-10-CM | POA: Diagnosis not present

## 2019-11-02 DIAGNOSIS — Z992 Dependence on renal dialysis: Secondary | ICD-10-CM | POA: Diagnosis not present

## 2019-11-03 DIAGNOSIS — G5601 Carpal tunnel syndrome, right upper limb: Secondary | ICD-10-CM | POA: Diagnosis not present

## 2019-11-03 DIAGNOSIS — R278 Other lack of coordination: Secondary | ICD-10-CM | POA: Diagnosis not present

## 2019-11-03 DIAGNOSIS — M25531 Pain in right wrist: Secondary | ICD-10-CM | POA: Diagnosis not present

## 2019-11-04 DIAGNOSIS — D631 Anemia in chronic kidney disease: Secondary | ICD-10-CM | POA: Diagnosis not present

## 2019-11-04 DIAGNOSIS — N186 End stage renal disease: Secondary | ICD-10-CM | POA: Diagnosis not present

## 2019-11-04 DIAGNOSIS — D509 Iron deficiency anemia, unspecified: Secondary | ICD-10-CM | POA: Diagnosis not present

## 2019-11-04 DIAGNOSIS — D472 Monoclonal gammopathy: Secondary | ICD-10-CM | POA: Diagnosis not present

## 2019-11-04 DIAGNOSIS — N2581 Secondary hyperparathyroidism of renal origin: Secondary | ICD-10-CM | POA: Diagnosis not present

## 2019-11-04 DIAGNOSIS — K529 Noninfective gastroenteritis and colitis, unspecified: Secondary | ICD-10-CM | POA: Diagnosis not present

## 2019-11-04 DIAGNOSIS — Z992 Dependence on renal dialysis: Secondary | ICD-10-CM | POA: Diagnosis not present

## 2019-11-04 DIAGNOSIS — B20 Human immunodeficiency virus [HIV] disease: Secondary | ICD-10-CM | POA: Diagnosis not present

## 2019-11-06 DIAGNOSIS — G5601 Carpal tunnel syndrome, right upper limb: Secondary | ICD-10-CM | POA: Diagnosis not present

## 2019-11-06 DIAGNOSIS — R278 Other lack of coordination: Secondary | ICD-10-CM | POA: Diagnosis not present

## 2019-11-06 DIAGNOSIS — M25531 Pain in right wrist: Secondary | ICD-10-CM | POA: Diagnosis not present

## 2019-11-07 DIAGNOSIS — D631 Anemia in chronic kidney disease: Secondary | ICD-10-CM | POA: Diagnosis not present

## 2019-11-07 DIAGNOSIS — N186 End stage renal disease: Secondary | ICD-10-CM | POA: Diagnosis not present

## 2019-11-07 DIAGNOSIS — D472 Monoclonal gammopathy: Secondary | ICD-10-CM | POA: Diagnosis not present

## 2019-11-07 DIAGNOSIS — N2581 Secondary hyperparathyroidism of renal origin: Secondary | ICD-10-CM | POA: Diagnosis not present

## 2019-11-07 DIAGNOSIS — D509 Iron deficiency anemia, unspecified: Secondary | ICD-10-CM | POA: Diagnosis not present

## 2019-11-07 DIAGNOSIS — Z992 Dependence on renal dialysis: Secondary | ICD-10-CM | POA: Diagnosis not present

## 2019-11-08 DIAGNOSIS — G5601 Carpal tunnel syndrome, right upper limb: Secondary | ICD-10-CM | POA: Diagnosis not present

## 2019-11-08 DIAGNOSIS — M25531 Pain in right wrist: Secondary | ICD-10-CM | POA: Diagnosis not present

## 2019-11-08 DIAGNOSIS — R278 Other lack of coordination: Secondary | ICD-10-CM | POA: Diagnosis not present

## 2019-11-09 DIAGNOSIS — D631 Anemia in chronic kidney disease: Secondary | ICD-10-CM | POA: Diagnosis not present

## 2019-11-09 DIAGNOSIS — Z992 Dependence on renal dialysis: Secondary | ICD-10-CM | POA: Diagnosis not present

## 2019-11-09 DIAGNOSIS — N2581 Secondary hyperparathyroidism of renal origin: Secondary | ICD-10-CM | POA: Diagnosis not present

## 2019-11-09 DIAGNOSIS — D509 Iron deficiency anemia, unspecified: Secondary | ICD-10-CM | POA: Diagnosis not present

## 2019-11-09 DIAGNOSIS — D472 Monoclonal gammopathy: Secondary | ICD-10-CM | POA: Diagnosis not present

## 2019-11-09 DIAGNOSIS — N186 End stage renal disease: Secondary | ICD-10-CM | POA: Diagnosis not present

## 2019-11-10 DIAGNOSIS — M25531 Pain in right wrist: Secondary | ICD-10-CM | POA: Diagnosis not present

## 2019-11-10 DIAGNOSIS — R278 Other lack of coordination: Secondary | ICD-10-CM | POA: Diagnosis not present

## 2019-11-10 DIAGNOSIS — Z79899 Other long term (current) drug therapy: Secondary | ICD-10-CM | POA: Diagnosis not present

## 2019-11-10 DIAGNOSIS — G5601 Carpal tunnel syndrome, right upper limb: Secondary | ICD-10-CM | POA: Diagnosis not present

## 2019-11-11 DIAGNOSIS — D509 Iron deficiency anemia, unspecified: Secondary | ICD-10-CM | POA: Diagnosis not present

## 2019-11-11 DIAGNOSIS — Z992 Dependence on renal dialysis: Secondary | ICD-10-CM | POA: Diagnosis not present

## 2019-11-11 DIAGNOSIS — D472 Monoclonal gammopathy: Secondary | ICD-10-CM | POA: Diagnosis not present

## 2019-11-11 DIAGNOSIS — N2581 Secondary hyperparathyroidism of renal origin: Secondary | ICD-10-CM | POA: Diagnosis not present

## 2019-11-11 DIAGNOSIS — D631 Anemia in chronic kidney disease: Secondary | ICD-10-CM | POA: Diagnosis not present

## 2019-11-11 DIAGNOSIS — N186 End stage renal disease: Secondary | ICD-10-CM | POA: Diagnosis not present

## 2019-11-15 ENCOUNTER — Encounter (HOSPITAL_COMMUNITY): Payer: Self-pay | Admitting: Orthopedic Surgery

## 2019-11-15 ENCOUNTER — Other Ambulatory Visit: Payer: Self-pay | Admitting: Orthopedic Surgery

## 2019-11-15 ENCOUNTER — Other Ambulatory Visit: Payer: Self-pay

## 2019-11-15 DIAGNOSIS — M25531 Pain in right wrist: Secondary | ICD-10-CM | POA: Diagnosis not present

## 2019-11-15 DIAGNOSIS — G5601 Carpal tunnel syndrome, right upper limb: Secondary | ICD-10-CM | POA: Diagnosis not present

## 2019-11-15 DIAGNOSIS — R278 Other lack of coordination: Secondary | ICD-10-CM | POA: Diagnosis not present

## 2019-11-15 DIAGNOSIS — E875 Hyperkalemia: Secondary | ICD-10-CM | POA: Diagnosis not present

## 2019-11-15 NOTE — Progress Notes (Signed)
  [place], [place] - [address] [place] [address] Phone: [phone] Fax: [phone]     Your procedure is scheduled on Thursday, 11/16/19 at 8:30 AM.  Report to [facility] Entrance "A" at 5:30 A.M., and check in at the Admitting office.  Call this number if you have problems the morning of surgery:  [phone]  Call [phone] if you have any questions prior to your surgery date Monday-Friday 8am-4pm   Remember:  Do not eat after midnight the night before your surgery   Take these medicines the morning of surgery with A SIP OF WATER  escitalopram (LEXAPRO) lamivudine (EPIVIR)  omeprazole (PRILOSEC)  zidovudine (RETROVIR)   nitroGLYCERIN (NITROSTAT) PRN acetaminophen (TYLENOL) PRN ondansetron (ZOFRAN-ODT) PRN Oxycodone HCl PRN   As of today, STOP taking any Aspirin (unless otherwise instructed by your surgeon) Aleve, Naproxen, Ibuprofen, Motrin, Advil, Goody's, BC's, all herbal medications, fish oil, and all vitamins.                      Do not wear jewelry, make up, or nail polish            Do not wear lotions, powders, perfumes, or deodorant.            Do not shave 48 hours prior to surgery.              Do not bring valuables to the hospital.            [facility] is not responsible for any belongings or valuables.  Do NOT Smoke (Tobacco/Vaping) or drink Alcohol 24 hours prior to your procedure If you use a CPAP at night, you may bring all equipment for your overnight stay.   Contacts, glasses, dentures or bridgework may not be worn into surgery.      For patients admitted to the hospital, discharge time will be determined by your treatment team.   Patients discharged the day of surgery will not be allowed to drive home, and someone needs to stay with them for 24 hours.   Oral Hygiene is also important to reduce your risk of infection.  Remember - BRUSH YOUR TEETH THE MORNING OF SURGERY WITH YOUR  REGULAR TOOTHPASTE   Day of Surgery: Wear Clean/Comfortable clothing the morning of surgery Do not apply any deodorants/lotions.   Remember to brush your teeth WITH YOUR REGULAR TOOTHPASTE.

## 2019-11-15 NOTE — Anesthesia Preprocedure Evaluation (Addendum)
Anesthesia Evaluation  Patient identified by MRN, date of birth, ID band Patient awake    Reviewed: Allergy & Precautions, H&P , NPO status , Patient's Chart, lab work & pertinent test results  Airway Mallampati: II  TM Distance: >3 FB Neck ROM: Full    Dental no notable dental hx. (+) Edentulous Upper, Edentulous Lower, Dental Advisory Given   Pulmonary neg pulmonary ROS, former smoker,    Pulmonary exam normal breath sounds clear to auscultation       Cardiovascular hypertension,  Rhythm:Regular Rate:Normal     Neuro/Psych  Headaches, Anxiety Depression    GI/Hepatic Neg liver ROS, PUD, GERD  ,  Endo/Other  negative endocrine ROS  Renal/GU ESRF and DialysisRenal disease  negative genitourinary   Musculoskeletal   Abdominal   Peds  Hematology  (+) Blood dyscrasia, anemia , HIV,   Anesthesia Other Findings   Reproductive/Obstetrics negative OB ROS                            Anesthesia Physical Anesthesia Plan  ASA: III  Anesthesia Plan: MAC   Post-op Pain Management:    Induction: Intravenous  PONV Risk Score and Plan: 3 and Ondansetron, Dexamethasone, Treatment may vary due to age or medical condition and Propofol infusion  Airway Management Planned: Simple Face Mask  Additional Equipment:   Intra-op Plan:   Post-operative Plan:   Informed Consent: I have reviewed the patients History and Physical, chart, labs and discussed the procedure including the risks, benefits and alternatives for the proposed anesthesia with the patient or authorized representative who has indicated his/her understanding and acceptance.     Dental advisory given  Plan Discussed with: CRNA  Anesthesia Plan Comments: (See PAT note signed on 10/24/2019 by Myra Gianotti, PA-C. Surgery rescheduled from 10/26/2019 to 11/16/2019, possible due to temporarily not being able to run same day COVID tests on  10/26/2019. She resides in a SNF, so needs a same day test.  )      Anesthesia Quick Evaluation

## 2019-11-15 NOTE — Progress Notes (Addendum)
Patient resides at [facility] [phone]. Spoke with Nurse [facility] for PAT information.          PCP - Dr Excell Seltzer @[facility] [place] - Dr Harrington [facility] - Dr Delton Coombes  Chest x-ray - 07/19/19 (1V) EKG - 09/29/19 Stress Test - n/a ECHO - 02/21/15 Cardiac Cath - 09/21/12 CE  Anesthesia review: Yes Ebony Hail, PA  Plavix last dose on 11/02/19 per Nurse Lovey Newcomer.  STOP now taking any Aspirin (unless otherwise instructed by your surgeon), Aleve, Naproxen, Ibuprofen, Motrin, Advil, Goody's, BC's, all herbal medications, fish oil, and all vitamins.   Coronavirus Screening Covid test on DOS 11/16/19 Do you have any of the following symptoms:  Cough yes/no: No Fever (>100.42F)  yes/no: No Runny nose yes/no: No Sore throat yes/no: No Difficulty breathing/shortness of breath  yes/no: No  Have you traveled in the last 14 days and where? yes/no: No  Nurse Lovey Newcomer verbalized understanding of instructions that were given via phone and instructions were faxed to [phone] [facility]).

## 2019-11-16 ENCOUNTER — Encounter (HOSPITAL_COMMUNITY): Payer: Self-pay | Admitting: Orthopedic Surgery

## 2019-11-16 ENCOUNTER — Ambulatory Visit (HOSPITAL_COMMUNITY): Payer: Medicare Other | Admitting: Anesthesiology

## 2019-11-16 ENCOUNTER — Encounter (HOSPITAL_COMMUNITY)
Admission: RE | Disposition: A | Discharge status: Home / Self Care | Payer: Self-pay | Source: Home / Self Care | Attending: Orthopedic Surgery

## 2019-11-16 ENCOUNTER — Ambulatory Visit (HOSPITAL_COMMUNITY)
Admission: RE | Admit: 2019-11-16 | Discharge: 2019-11-16 | Disposition: A | Discharge status: Home / Self Care | Payer: Medicare Other | Attending: Orthopedic Surgery | Admitting: Orthopedic Surgery

## 2019-11-16 DIAGNOSIS — Z992 Dependence on renal dialysis: Secondary | ICD-10-CM | POA: Diagnosis not present

## 2019-11-16 DIAGNOSIS — I132 Hypertensive heart and chronic kidney disease with heart failure and with stage 5 chronic kidney disease, or end stage renal disease: Secondary | ICD-10-CM | POA: Diagnosis not present

## 2019-11-16 DIAGNOSIS — Z20822 Contact with and (suspected) exposure to covid-19: Secondary | ICD-10-CM | POA: Diagnosis not present

## 2019-11-16 DIAGNOSIS — G5601 Carpal tunnel syndrome, right upper limb: Secondary | ICD-10-CM | POA: Diagnosis not present

## 2019-11-16 DIAGNOSIS — Z87891 Personal history of nicotine dependence: Secondary | ICD-10-CM | POA: Diagnosis not present

## 2019-11-16 DIAGNOSIS — F419 Anxiety disorder, unspecified: Secondary | ICD-10-CM | POA: Diagnosis not present

## 2019-11-16 DIAGNOSIS — Z8673 Personal history of transient ischemic attack (TIA), and cerebral infarction without residual deficits: Secondary | ICD-10-CM | POA: Diagnosis not present

## 2019-11-16 DIAGNOSIS — K219 Gastro-esophageal reflux disease without esophagitis: Secondary | ICD-10-CM | POA: Insufficient documentation

## 2019-11-16 DIAGNOSIS — I120 Hypertensive chronic kidney disease with stage 5 chronic kidney disease or end stage renal disease: Secondary | ICD-10-CM | POA: Insufficient documentation

## 2019-11-16 DIAGNOSIS — Z880 Allergy status to penicillin: Secondary | ICD-10-CM | POA: Diagnosis not present

## 2019-11-16 DIAGNOSIS — D631 Anemia in chronic kidney disease: Secondary | ICD-10-CM | POA: Diagnosis not present

## 2019-11-16 DIAGNOSIS — Z21 Asymptomatic human immunodeficiency virus [HIV] infection status: Secondary | ICD-10-CM | POA: Diagnosis not present

## 2019-11-16 DIAGNOSIS — Z79899 Other long term (current) drug therapy: Secondary | ICD-10-CM | POA: Insufficient documentation

## 2019-11-16 DIAGNOSIS — F329 Major depressive disorder, single episode, unspecified: Secondary | ICD-10-CM | POA: Insufficient documentation

## 2019-11-16 DIAGNOSIS — I509 Heart failure, unspecified: Secondary | ICD-10-CM | POA: Diagnosis not present

## 2019-11-16 DIAGNOSIS — N186 End stage renal disease: Secondary | ICD-10-CM | POA: Insufficient documentation

## 2019-11-16 DIAGNOSIS — Z7902 Long term (current) use of antithrombotics/antiplatelets: Secondary | ICD-10-CM | POA: Diagnosis not present

## 2019-11-16 HISTORY — DX: Other chronic pain: G89.29

## 2019-11-16 HISTORY — PX: CARPAL TUNNEL RELEASE: SHX101

## 2019-11-16 LAB — POCT I-STAT, CHEM 8
BUN: 58 mg/dL — ABNORMAL HIGH (ref 8–23)
Calcium, Ion: 1.13 mmol/L — ABNORMAL LOW (ref 1.15–1.40)
Chloride: 101 mmol/L (ref 98–111)
Creatinine, Ser: 12.7 mg/dL — ABNORMAL HIGH (ref 0.44–1.00)
Glucose, Bld: 70 mg/dL (ref 70–99)
HCT: 39 % (ref 36.0–46.0)
Hemoglobin: 13.3 g/dL (ref 12.0–15.0)
Potassium: 5 mmol/L (ref 3.5–5.1)
Sodium: 137 mmol/L (ref 135–145)
TCO2: 23 mmol/L (ref 22–32)

## 2019-11-16 LAB — SARS CORONAVIRUS 2 BY RT PCR (HOSPITAL ORDER, PERFORMED IN ~~LOC~~ HOSPITAL LAB): SARS Coronavirus 2: NEGATIVE

## 2019-11-16 SURGERY — CARPAL TUNNEL RELEASE
Anesthesia: Monitor Anesthesia Care | Laterality: Right

## 2019-11-16 MED ORDER — CHLORHEXIDINE GLUCONATE 0.12 % MT SOLN
15.0000 mL | Freq: Once | OROMUCOSAL | Status: AC
  Administered 2019-11-16: 15 mL via OROMUCOSAL
  Filled 2019-11-16: qty 15

## 2019-11-16 MED ORDER — ACETAMINOPHEN 500 MG PO TABS
1000.0000 mg | ORAL_TABLET | Freq: Once | ORAL | Status: AC
  Administered 2019-11-16: 1000 mg via ORAL

## 2019-11-16 MED ORDER — ROCURONIUM BROMIDE 10 MG/ML (PF) SYRINGE
PREFILLED_SYRINGE | INTRAVENOUS | Status: AC
  Filled 2019-11-16: qty 10

## 2019-11-16 MED ORDER — PROPOFOL 500 MG/50ML IV EMUL
INTRAVENOUS | Status: DC | PRN
  Administered 2019-11-16: 100 ug/kg/min via INTRAVENOUS

## 2019-11-16 MED ORDER — BUPIVACAINE HCL (PF) 0.25 % IJ SOLN
INTRAMUSCULAR | Status: AC
  Filled 2019-11-16: qty 30

## 2019-11-16 MED ORDER — DEXAMETHASONE SODIUM PHOSPHATE 10 MG/ML IJ SOLN
INTRAMUSCULAR | Status: AC
  Filled 2019-11-16: qty 1

## 2019-11-16 MED ORDER — FENTANYL CITRATE (PF) 100 MCG/2ML IJ SOLN: 25.0000 ug | INTRAMUSCULAR | Status: DC | PRN

## 2019-11-16 MED ORDER — ONDANSETRON HCL 4 MG/2ML IJ SOLN
INTRAMUSCULAR | Status: AC
  Filled 2019-11-16: qty 2

## 2019-11-16 MED ORDER — EPINEPHRINE PF 1 MG/ML IJ SOLN
INTRAMUSCULAR | Status: DC | PRN
  Administered 2019-11-16: 1 mg

## 2019-11-16 MED ORDER — BUPIVACAINE HCL (PF) 0.25 % IJ SOLN
INTRAMUSCULAR | Status: DC | PRN
  Administered 2019-11-16: 30 mL

## 2019-11-16 MED ORDER — PROPOFOL 10 MG/ML IV BOLUS
INTRAVENOUS | Status: AC
  Filled 2019-11-16: qty 20

## 2019-11-16 MED ORDER — FENTANYL CITRATE (PF) 250 MCG/5ML IJ SOLN
INTRAMUSCULAR | Status: AC
  Filled 2019-11-16: qty 5

## 2019-11-16 MED ORDER — ACETAMINOPHEN 500 MG PO TABS
ORAL_TABLET | ORAL | Status: AC
  Filled 2019-11-16: qty 2

## 2019-11-16 MED ORDER — SODIUM CHLORIDE 0.9 % IV SOLN: INTRAVENOUS | Status: DC

## 2019-11-16 MED ORDER — VANCOMYCIN HCL IN DEXTROSE 1-5 GM/200ML-% IV SOLN
1000.0000 mg | INTRAVENOUS | Status: AC
  Administered 2019-11-16: 1000 mg via INTRAVENOUS
  Filled 2019-11-16: qty 200

## 2019-11-16 MED ORDER — ORAL CARE MOUTH RINSE: 15.0000 mL | Freq: Once | OROMUCOSAL | Status: AC

## 2019-11-16 MED ORDER — LACTATED RINGERS IV SOLN: INTRAVENOUS | Status: DC

## 2019-11-16 MED ORDER — EPINEPHRINE PF 1 MG/ML IJ SOLN
INTRAMUSCULAR | Status: AC
  Filled 2019-11-16: qty 1

## 2019-11-16 MED ORDER — 0.9 % SODIUM CHLORIDE (POUR BTL) OPTIME
TOPICAL | Status: DC | PRN
  Administered 2019-11-16: 1000 mL

## 2019-11-16 MED ORDER — PHENYLEPHRINE 40 MCG/ML (10ML) SYRINGE FOR IV PUSH (FOR BLOOD PRESSURE SUPPORT)
PREFILLED_SYRINGE | INTRAVENOUS | Status: AC
  Filled 2019-11-16: qty 10

## 2019-11-16 MED ORDER — LIDOCAINE 2% (20 MG/ML) 5 ML SYRINGE
INTRAMUSCULAR | Status: AC
  Filled 2019-11-16: qty 5

## 2019-11-16 MED ORDER — HYDROCODONE-ACETAMINOPHEN 5-325 MG PO TABS
ORAL_TABLET | ORAL | 0 refills | Status: DC
Start: 2019-11-16 — End: 2019-11-28

## 2019-11-16 MED ORDER — LIDOCAINE HCL (PF) 1 % IJ SOLN
INTRAMUSCULAR | Status: AC
  Filled 2019-11-16: qty 30

## 2019-11-16 MED ORDER — LIDOCAINE HCL (PF) 1 % IJ SOLN
INTRAMUSCULAR | Status: DC | PRN
  Administered 2019-11-16: 30 mL

## 2019-11-16 SURGICAL SUPPLY — 41 items
BNDG COHESIVE 3X5 TAN STRL LF (GAUZE/BANDAGES/DRESSINGS) ×2 IMPLANT
BNDG ELASTIC 3X5.8 VLCR STR LF (GAUZE/BANDAGES/DRESSINGS) ×2 IMPLANT
BNDG ELASTIC 4X5.8 VLCR STR LF (GAUZE/BANDAGES/DRESSINGS) ×2 IMPLANT
BNDG ESMARK 4X9 LF (GAUZE/BANDAGES/DRESSINGS) ×2 IMPLANT
BNDG GAUZE ELAST 4 BULKY (GAUZE/BANDAGES/DRESSINGS) ×2 IMPLANT
CHLORAPREP W/TINT 26 (MISCELLANEOUS) ×2 IMPLANT
CORD BIPOLAR FORCEPS 12FT (ELECTRODE) ×2 IMPLANT
COVER SURGICAL LIGHT HANDLE (MISCELLANEOUS) ×2 IMPLANT
COVER WAND RF STERILE (DRAPES) ×2 IMPLANT
CUFF TOURN SGL QUICK 18X4 (TOURNIQUET CUFF) ×2 IMPLANT
CUFF TOURN SGL QUICK 24 (TOURNIQUET CUFF)
CUFF TRNQT CYL 24X4X16.5-23 (TOURNIQUET CUFF) IMPLANT
DRAPE SURG 17X23 STRL (DRAPES) ×2 IMPLANT
DRSG PAD ABDOMINAL 8X10 ST (GAUZE/BANDAGES/DRESSINGS) ×2 IMPLANT
GAUZE SPONGE 4X4 12PLY STRL (GAUZE/BANDAGES/DRESSINGS) ×2 IMPLANT
GAUZE XEROFORM 1X8 LF (GAUZE/BANDAGES/DRESSINGS) ×2 IMPLANT
GAUZE XEROFORM 5X9 LF (GAUZE/BANDAGES/DRESSINGS) ×2 IMPLANT
GLOVE BIO SURGEON STRL SZ7.5 (GLOVE) ×4 IMPLANT
GLOVE BIOGEL PI IND STRL 8 (GLOVE) ×1 IMPLANT
GLOVE BIOGEL PI INDICATOR 8 (GLOVE) ×1
GOWN STRL REUS W/ TWL LRG LVL3 (GOWN DISPOSABLE) ×2 IMPLANT
GOWN STRL REUS W/TWL LRG LVL3 (GOWN DISPOSABLE) ×4
KIT BASIN OR (CUSTOM PROCEDURE TRAY) ×2 IMPLANT
KIT TURNOVER KIT B (KITS) ×2 IMPLANT
NEEDLE HYPO 25GX1X1/2 BEV (NEEDLE) IMPLANT
NS IRRIG 1000ML POUR BTL (IV SOLUTION) ×2 IMPLANT
PACK ORTHO EXTREMITY (CUSTOM PROCEDURE TRAY) ×2 IMPLANT
PAD ARMBOARD 7.5X6 YLW CONV (MISCELLANEOUS) ×4 IMPLANT
PADDING CAST ABS 4INX4YD NS (CAST SUPPLIES) ×1
PADDING CAST ABS COTTON 4X4 ST (CAST SUPPLIES) ×1 IMPLANT
SPONGE LAP 4X18 RFD (DISPOSABLE) ×2 IMPLANT
SUCTION FRAZIER HANDLE 10FR (MISCELLANEOUS)
SUCTION TUBE FRAZIER 10FR DISP (MISCELLANEOUS) IMPLANT
SUT ETHILON 3 0 PS 1 (SUTURE) ×2 IMPLANT
SUT VIC AB 3-0 SH 18 (SUTURE) ×2 IMPLANT
SYR CONTROL 10ML LL (SYRINGE) ×2 IMPLANT
TOWEL GREEN STERILE (TOWEL DISPOSABLE) ×2 IMPLANT
TOWEL GREEN STERILE FF (TOWEL DISPOSABLE) ×2 IMPLANT
TUBE CONNECTING 12X1/4 (SUCTIONS) ×2 IMPLANT
UNDERPAD 30X36 HEAVY ABSORB (UNDERPADS AND DIAPERS) ×2 IMPLANT
WATER STERILE IRR 1000ML POUR (IV SOLUTION) ×2 IMPLANT

## 2019-11-16 NOTE — Anesthesia Postprocedure Evaluation (Signed)
Anesthesia Post Note  Patient: [name]  Procedure(s) Performed: CARPAL TUNNEL RELEASE (Right )     Patient location during evaluation: PACU Anesthesia Type: MAC Level of consciousness: awake and alert Pain management: pain level controlled Vital Signs Assessment: post-procedure vital signs reviewed and stable Respiratory status: spontaneous breathing, nonlabored ventilation and respiratory function stable Cardiovascular status: stable and blood pressure returned to baseline Postop Assessment: no apparent nausea or vomiting Anesthetic complications: no   No complications documented.  Last Vitals:  Vitals:   11/16/19 1025 11/16/19 1045  BP: (!) 153/91   Pulse: 62 (P) 67  Resp: 15   Temp:    SpO2:      Last Pain:  Vitals:   11/16/19 0614  TempSrc: Temporal                 Eustacia Urbanek,W. EDMOND

## 2019-11-16 NOTE — Progress Notes (Signed)
Late entry:  RN was never contacted by CRNA for handoff prior to taking patient to OR

## 2019-11-16 NOTE — H&P (Signed)
[name] is an 69 y.o. female.   Chief Complaint: right carpal tunnel syndrome HPI: 69 yo female with numbness and tingling right hand.  Nocturnal symptoms.  She wishes to have right carpal tunnel release.  Allergies:  Allergies  Allergen Reactions  . Lactose Intolerance (Gi) Other (See Comments)    On MAR  . Latex Rash and Itching  . Penicillins Rash and Swelling    Has patient had a PCN reaction causing immediate rash, facial/tongue/throat swelling, SOB or lightheadedness with hypotension: No Has patient had a PCN reaction causing severe rash involving mucus membranes or skin necrosis: No Has patient had a PCN reaction that required hospitalization No Has patient had a PCN reaction occurring within the last 10 years: No If all of the above answers are "NO", then may proceed with Cephalosporin use.      Past Medical History:  Diagnosis Date  . Anemia   . Anxiety   . Atrial flutter ([place]) 02/22/2015  . C. difficile colitis 10/10/11  . Chronic diarrhea   . Chronic pain   . Depression   . ESRD on hemodialysis [facility])    Dialysis [facility]  . GERD (gastroesophageal reflux disease)   . HIV (human immunodeficiency virus infection) ([place])   . Hypertension   . Insomnia   . Intracranial hemorrhage ([place])   . Muscle weakness (generalized)   . Pulmonary HTN ([place])   . Tachycardia   . TIA (transient ischemic attack)    [facility] do not have this dx  . Traumatic hematoma of right upper arm 02/22/2015    Past Surgical History:  Procedure Laterality Date  . A/V FISTULAGRAM N/A 04/17/2016   Procedure: A/V Fistulagram - Right Upper;  Surgeon: Waynetta Sandy, MD;  Location: [place] CV LAB;  Service: Cardiovascular;  Laterality: N/A;  . A/V FISTULAGRAM Right 08/28/2019   Procedure: A/V FISTULAGRAM;  Surgeon: Waynetta Sandy, MD;  Location: [place] CV LAB;  Service: Cardiovascular;  Laterality: Right;  . ABDOMINAL HYSTERECTOMY    .  BIOPSY THYROID    . Dialysis Shunts     previous one removed from left arm and now present one in right arm  . DIALYSIS/PERMA CATHETER INSERTION Right 04/17/2016   Procedure: dialysis Catheter Insertion central veinous;  Surgeon: Waynetta Sandy, MD;  Location: [place] CV LAB;  Service: Cardiovascular;  Laterality: Right;  . ESOPHAGOGASTRODUODENOSCOPY  12/15/2010   [place]: erosive reflux esophagitis, MW tear, hiatal hernia (small), gastritis with no h.pylori, possibly nsaid related  . EXCISION OF BREAST BIOPSY Right 05/04/2012   Procedure: EXCISION OF BREAST BIOPSY;  Surgeon: Donato Heinz, MD;  Location: [facility];  Service: General;  Laterality: Right;  Right Excisional Breast Biopsy  . FISTULOGRAM Right 03/26/2014   Procedure: Right Arm Fistulogram with Venoplasty Right Subclavian Vein and Inominate Vein. Debridement Fistula Ulcer;  Surgeon: Conrad Platt[place], MD;  Location: [place];  Service: Vascular;  Laterality: Right;  . FISTULOGRAM Right 03/29/2017   Procedure: FISTULOGRAM COMPLEX RIGHT ARM with Balloon angioplasty;  Surgeon: Waynetta Sandy, MD;  Location: [place];  Service: Vascular;  Laterality: Right;  . IR GENERIC HISTORICAL  05/19/2016   IR REMOVAL TUN CV CATH W/O FL 05/19/2016 Markus Daft, MD [facility]-INTERV RAD  . PERIPHERAL VASCULAR BALLOON ANGIOPLASTY Right 04/17/2016   Procedure: Peripheral Vascular Balloon Angioplasty;  Surgeon: Waynetta Sandy, MD;  Location: [place] CV LAB;  Service: Cardiovascular;  Laterality: Right;  Central segment AV Fistula  .  PERIPHERAL VASCULAR BALLOON ANGIOPLASTY Right 03/14/2018   Procedure: PERIPHERAL VASCULAR BALLOON ANGIOPLASTY;  Surgeon: Waynetta Sandy, MD;  Location: [place] CV LAB;  Service: Cardiovascular;  Laterality: Right;  subclavian vein  . PERIPHERAL VASCULAR BALLOON ANGIOPLASTY Right 08/28/2019   Procedure: PERIPHERAL VASCULAR BALLOON ANGIOPLASTY;  Surgeon: Waynetta Sandy, MD;  Location: [place]  CV LAB;  Service: Cardiovascular;  Laterality: Right;  upper arm  . PERIPHERAL VASCULAR INTERVENTION Right 03/14/2018   Procedure: PERIPHERAL VASCULAR INTERVENTION;  Surgeon: Waynetta Sandy, MD;  Location: [place] CV LAB;  Service: Cardiovascular;  Laterality: Right;  subclavian vein  . SHUNTOGRAM Right 10/19/2013   Procedure: FISTULOGRAM;  Surgeon: Conrad , MD;  Location: [facility] CATH LAB;  Service: Cardiovascular;  Laterality: Right;  . TONSILLECTOMY    . VISCERAL VENOGRAPHY Right 03/14/2018   Procedure: CENTRAL VENOGRAPHY;  Surgeon: Waynetta Sandy, MD;  Location: [place] CV LAB;  Service: Cardiovascular;  Laterality: Right;  upper ext fistula    Family History: Family History  Problem Relation Age of Onset  . Anesthesia problems Neg Hx   . Hypotension Neg Hx   . Malignant hyperthermia Neg Hx   . Pseudochol deficiency Neg Hx     Social History:   reports that she quit smoking about 36 years ago. Her smoking use included cigarettes. She has never used smokeless tobacco. She reports that she does not drink alcohol and does not use drugs.  Medications: Medications Prior to Admission  Medication Sig Dispense Refill  . Amino Acids-Protein Hydrolys (FEEDING SUPPLEMENT, PRO-STAT SUGAR FREE 64,) LIQD Take 30 mLs by mouth daily. (WOUND HEALING) (0800)    . Biotin 5000 MCG CAPS Take 5,000 mcg by mouth daily. (0800)    . calcium acetate, Phos Binder, (PHOSLYRA) 667 MG/5ML SOLN Take 2,001 mg by mouth 3 (three) times daily with meals. (0800, 1200, & 2000)    . calcium elemental as carbonate (BARIATRIC TUMS ULTRA) 400 MG chewable tablet Chew 1,000 mg by mouth in the morning and at bedtime. (0800 & 2000)    . clopidogrel (PLAVIX) 75 MG tablet Take 1 tablet (75 mg total) by mouth daily. (Patient taking differently: Take 75 mg by mouth daily. (0800)) 30 tablet 3  . cyanocobalamin (,VITAMIN B-12,) 1000 MCG/ML injection Inject 1 mL (1,000 mcg total) into the muscle every 28  (twenty-eight) days. 1 mL 12  . dolutegravir (TIVICAY) 50 MG tablet Take 50 mg by mouth daily at 4 PM.     . escitalopram (LEXAPRO) 10 MG tablet Take 10 mg by mouth daily. (0800)    . fentaNYL (DURAGESIC) 50 MCG/HR Place 1 patch onto the skin every 3 (three) days.    Marland Kitchen lamivudine (EPIVIR) 100 MG tablet Take 50 mg by mouth daily. (0800)    . lanolin/mineral oil (KERI/THERA-DERM) LOTN Apply 1 application topically every 12 (twelve) hours as needed for dry skin.     . methadone (DOLOPHINE) 5 MG tablet Take 2.5 mg by mouth daily at 8 pm.     . mirtazapine (REMERON) 15 MG tablet Take 15 mg by mouth at bedtime. (2000)    . Multiple Vitamin (MULTIVITAMIN WITH MINERALS) TABS tablet Take 1 tablet by mouth at bedtime. (2000)    . nitroGLYCERIN (NITROSTAT) 0.4 MG SL tablet Place 0.4 mg under the tongue every 5 (five) minutes x 3 doses as needed for chest pain.     Marland Kitchen omeprazole (PRILOSEC) 20 MG capsule Take 40 mg by mouth daily at 6 (six) AM.     .  ondansetron (ZOFRAN-ODT) 4 MG disintegrating tablet Take 4 mg by mouth every 8 (eight) hours as needed for nausea or vomiting.     . Oxycodone HCl 10 MG TABS Take 1 tablet (10 mg total) by mouth every 12 (twelve) hours as needed (severe pain). 5 tablet 0  . promethazine (PHENERGAN) 12.5 MG tablet Take 12.5 mg by mouth See admin instructions. Take 12.5 mg by mouth in the morning every Tuesday, Thursday and Saturday for nausea/vomiting and every 6 hours as needed.    . trolamine salicylate (ASPERCREME) 10 % cream Apply 1 application topically as needed for muscle pain.     . vitamin C (ASCORBIC ACID) 500 MG tablet Take 500 mg by mouth 2 (two) times daily. (0900 & 2100)    . zidovudine (RETROVIR) 100 MG capsule Take 100 mg by mouth 3 (three) times daily. (0900, 1300, & 2100)    . Zinc 220 (50 Zn) MG CAPS Take 220 mg by mouth daily. (0800)    . acetaminophen (TYLENOL) 325 MG tablet Take 650 mg by mouth every 6 (six) hours as needed (for pain.).    Marland Kitchen OXYGEN Inhale 2 L  into the lungs as needed.       Results for orders placed or performed during the hospital encounter of 11/16/19 (from the past 48 hour(s))  SARS Coronavirus 2 by RT PCR (hospital order, performed in [facility] hospital lab) Nasopharyngeal Nasopharyngeal Swab     Status: None   Collection Time: 11/16/19  6:15 AM   Specimen: Nasopharyngeal Swab  Result Value Ref Range   SARS Coronavirus 2 NEGATIVE NEGATIVE    Comment: (NOTE) SARS-CoV-2 target nucleic acids are NOT DETECTED.  The SARS-CoV-2 RNA is generally detectable in upper and lower respiratory specimens during the acute phase of infection. The lowest concentration of SARS-CoV-2 viral copies this assay can detect is 250 copies / mL. A negative result does not preclude SARS-CoV-2 infection and should not be used as the sole basis for treatment or other patient management decisions.  A negative result may occur with improper specimen collection / handling, submission of specimen other than nasopharyngeal swab, presence of viral mutation(s) within the areas targeted by this assay, and inadequate number of viral copies (<250 copies / mL). A negative result must be combined with clinical observations, patient history, and epidemiological information.  Fact Sheet for Patients:   StrictlyIdeas.no  Fact Sheet for Healthcare Providers: BankingDealers.co.za  This test is not yet approved or  cleared by the [place] FDA and has been authorized for detection and/or diagnosis of SARS-CoV-2 by FDA under an Emergency Use Authorization (EUA).  This EUA will remain in effect (meaning this test can be used) for the duration of the COVID-19 declaration under Section 564(b)(1) of the Act, 21 U.S.C. section 360bbb-3(b)(1), unless the authorization is terminated or revoked sooner.  Performed at [facility], [place] [address] [place], [place] [address]   I-STAT, [place] [address]     Status: Abnormal    Collection Time: 11/16/19  6:40 AM  Result Value Ref Range   Sodium 137 135 - 145 mmol/L   Potassium 5.0 3.5 - 5.1 mmol/L   Chloride 101 98 - 111 mmol/L   BUN 58 (H) 8 - 23 mg/dL   Creatinine, Ser 12.70 (H) 0.44 - 1.00 mg/dL   Glucose, Bld 70 70 - 99 mg/dL    Comment: Glucose reference range applies only to samples taken after fasting for at least 8 hours.   Calcium,  Ion 1.13 (L) 1.15 - 1.40 mmol/L   TCO2 23 22 - 32 mmol/L   Hemoglobin 13.3 12.0 - 15.0 g/dL   HCT 39.0 36 - 46 %    No results found.   A comprehensive review of systems was negative.  Blood pressure (!) 178/80, pulse 81, temperature 97.7 F (36.5 C), temperature source Temporal, resp. rate 18, height 4\' 10"  (1.473 m), weight 44.8 kg, SpO2 95 %.  General appearance: alert, cooperative and appears stated age Head: Normocephalic, without obvious abnormality, atraumatic Neck: supple, symmetrical, trachea midline Cardio: regular rate and rhythm Resp: clear to auscultation bilaterally Extremities: Intact sensation and capillary refill all digits.  +epl/fpl/io.  No wounds.  Pulses: 2+ and symmetric Skin: Skin color, texture, turgor normal. No rashes or lesions Neurologic: Grossly normal Incision/Wound: none  Assessment/Plan Right carpal tunnel syndrome.  Non operative and operative treatment options have been discussed with the patient and patient wishes to proceed with operative treatment. Risks, benefits, and alternatives of surgery have been discussed and the patient agrees with the plan of care.   [name] 11/16/2019, 8:46 AM

## 2019-11-16 NOTE — Discharge Instructions (Signed)

## 2019-11-16 NOTE — Op Note (Signed)
I assisted Surgeon(s) and Role:    Leanora Cover, MD - Primary on the Procedure(s): [place] on 11/16/2019.  I provided assistance on this case as follows: setup, approach, retraction, release and closure with dressing application.  Electronically signed by: Daryll Brod, MD Date: 11/16/2019 Time: 10:04 AM

## 2019-11-16 NOTE — Progress Notes (Signed)
Attempted to call report twice to [facility] with no answer. Discharge packet sent with patient for receiving facility.

## 2019-11-16 NOTE — Op Note (Signed)
11/16/2019 [facility] OR                              OPERATIVE REPORT   PREOPERATIVE DIAGNOSIS:  Right carpal tunnel syndrome.  POSTOPERATIVE DIAGNOSIS:  Right carpal tunnel syndrome.  PROCEDURE:  Right carpal tunnel release.  SURGEON:  Leanora Cover, MD  ASSISTANT:  Daryll Brod, MD  ANESTHESIA: Local with sedation  IV FLUIDS:  Per anesthesia flow sheet.  ESTIMATED BLOOD LOSS:  Minimal.  COMPLICATIONS:  None.  SPECIMENS:  None.  TOURNIQUET TIME:   * No tourniquets in log *  DISPOSITION:  Stable to PACU.  LOCATION: [facility] OR  INDICATIONS:  69 yo female with numbness and tingling in right hand.  Nocturnal symptoms that wake her.  She wishes to have a carpal tunnel release for management of her symptoms.  Risks, benefits and alternatives of surgery were discussed including the risk of blood loss; infection; damage to nerves, vessels, tendons, ligaments, bone; failure of surgery; need for additional surgery; complications with wound healing; continued pain; recurrence of carpal tunnel syndrome; and damage to motor branch. She voiced understanding of these risks and elected to proceed.   OPERATIVE COURSE:  After being identified preoperatively by myself, the patient and I agreed upon the procedure and site of procedure.  The surgical site was marked.  The risks, benefits, and alternatives of the surgery were reviewed and she wished to proceed.  Surgical consent had been signed.  She was given IV vancomycin as preoperative antibiotic prophylaxis.  She was transferred to the operating room and placed on the operating room table in supine position with the Right upper extremity on an armboard.  Sedation was induced by the anesthesiologist.  Right upper extremity was prepped and draped in normal sterile orthopaedic fashion.  A surgical pause was performed between the surgeons, anesthesia, and operating room staff, and all were in agreement as to the patient, procedure, and site of procedure.  No  tourniquet was used. The surgical field was injected with 8 cc half and half 0.25% marcaine with epinephrine and 1% plain lidocaine. This was adequate to give anesthesia to the surgical site.  Incision was made over the transverse carpal ligament and over the distal forearm.  This was carried into the subcutaneous tissues by spreading technique.  Bipolar electrocautery was used to obtain hemostasis.  The volar antebrachial fascia was sharply incised.  The median nerve and palmar cutaneous branch of the nerve were identified and protected throughout the case.  The fascia was released over the nerve.  The transverse carpal ligament was identified and sharply incised from proximal to distal under direct visualization.  The nerve was examined.  It was flattened and hyperemic.  The motor branch was identified and was intact.  The wound was copiously irrigated with sterile saline.  It was then closed with 4-0 nylon in a horizontal mattress fashion.  It was dressed with sterile Xeroform, 4x4s, an ABD, and wrapped with Kerlix and an Ace bandage.  Care was taken to leave the fistula outside the dressing to ensure there was no compression over the fistula.  Fingertips were pink with brisk capillary refill at completion of the case.  Operative drapes were broken down.  The patient was awoken from anesthesia safely.  She was transferred back to stretcher and taken to the PACU in stable condition.  I will see her back in the office in 1 week for postoperative followup.  I  will give her a prescription for Norco 5/325 1-2 tabs PO q6 hours prn pain, dispense # 20.    Leanora Cover, MD Electronically signed, 11/16/19

## 2019-11-16 NOTE — Transfer of Care (Signed)
Immediate Anesthesia Transfer of Care Note  Patient: [name]  Procedure(s) Performed: CARPAL TUNNEL RELEASE (Right )  Patient Location: PACU  Anesthesia Type:MAC  Level of Consciousness: sedated and patient cooperative  Airway & Oxygen Therapy: Patient Spontanous Breathing and Patient connected to face mask oxygen  Post-op Assessment: Report given to RN, Post -op Vital signs reviewed and stable and Patient moving all extremities  Post vital signs: Reviewed and stable  Last Vitals:  Vitals Value Taken Time  BP    Temp    Pulse    Resp    SpO2      Last Pain:  Vitals:   11/16/19 0614  TempSrc: Temporal         Complications: No complications documented.

## 2019-11-17 ENCOUNTER — Encounter (HOSPITAL_COMMUNITY): Payer: Self-pay | Admitting: Orthopedic Surgery

## 2019-11-17 DIAGNOSIS — G5601 Carpal tunnel syndrome, right upper limb: Secondary | ICD-10-CM | POA: Diagnosis not present

## 2019-11-17 DIAGNOSIS — D631 Anemia in chronic kidney disease: Secondary | ICD-10-CM | POA: Diagnosis not present

## 2019-11-17 DIAGNOSIS — Z992 Dependence on renal dialysis: Secondary | ICD-10-CM | POA: Diagnosis not present

## 2019-11-17 DIAGNOSIS — M25531 Pain in right wrist: Secondary | ICD-10-CM | POA: Diagnosis not present

## 2019-11-17 DIAGNOSIS — D509 Iron deficiency anemia, unspecified: Secondary | ICD-10-CM | POA: Diagnosis not present

## 2019-11-17 DIAGNOSIS — N186 End stage renal disease: Secondary | ICD-10-CM | POA: Diagnosis not present

## 2019-11-17 DIAGNOSIS — N2581 Secondary hyperparathyroidism of renal origin: Secondary | ICD-10-CM | POA: Diagnosis not present

## 2019-11-17 DIAGNOSIS — D472 Monoclonal gammopathy: Secondary | ICD-10-CM | POA: Diagnosis not present

## 2019-11-17 DIAGNOSIS — R278 Other lack of coordination: Secondary | ICD-10-CM | POA: Diagnosis not present

## 2019-11-20 DIAGNOSIS — M25531 Pain in right wrist: Secondary | ICD-10-CM | POA: Diagnosis not present

## 2019-11-20 DIAGNOSIS — R278 Other lack of coordination: Secondary | ICD-10-CM | POA: Diagnosis not present

## 2019-11-20 DIAGNOSIS — G5601 Carpal tunnel syndrome, right upper limb: Secondary | ICD-10-CM | POA: Diagnosis not present

## 2019-11-21 ENCOUNTER — Ambulatory Visit: Payer: Medicare Other | Admitting: Nurse Practitioner

## 2019-11-21 DIAGNOSIS — D472 Monoclonal gammopathy: Secondary | ICD-10-CM | POA: Diagnosis not present

## 2019-11-21 DIAGNOSIS — Z992 Dependence on renal dialysis: Secondary | ICD-10-CM | POA: Diagnosis not present

## 2019-11-21 DIAGNOSIS — G5601 Carpal tunnel syndrome, right upper limb: Secondary | ICD-10-CM | POA: Diagnosis not present

## 2019-11-21 DIAGNOSIS — M25531 Pain in right wrist: Secondary | ICD-10-CM | POA: Diagnosis not present

## 2019-11-21 DIAGNOSIS — N2581 Secondary hyperparathyroidism of renal origin: Secondary | ICD-10-CM | POA: Diagnosis not present

## 2019-11-21 DIAGNOSIS — D631 Anemia in chronic kidney disease: Secondary | ICD-10-CM | POA: Diagnosis not present

## 2019-11-21 DIAGNOSIS — R278 Other lack of coordination: Secondary | ICD-10-CM | POA: Diagnosis not present

## 2019-11-21 DIAGNOSIS — N186 End stage renal disease: Secondary | ICD-10-CM | POA: Diagnosis not present

## 2019-11-21 DIAGNOSIS — D509 Iron deficiency anemia, unspecified: Secondary | ICD-10-CM | POA: Diagnosis not present

## 2019-11-22 DIAGNOSIS — F331 Major depressive disorder, recurrent, moderate: Secondary | ICD-10-CM | POA: Diagnosis not present

## 2019-11-22 DIAGNOSIS — F5101 Primary insomnia: Secondary | ICD-10-CM | POA: Diagnosis not present

## 2019-11-22 DIAGNOSIS — F330 Major depressive disorder, recurrent, mild: Secondary | ICD-10-CM | POA: Diagnosis not present

## 2019-11-22 DIAGNOSIS — G4701 Insomnia due to medical condition: Secondary | ICD-10-CM | POA: Diagnosis not present

## 2019-11-22 DIAGNOSIS — F064 Anxiety disorder due to known physiological condition: Secondary | ICD-10-CM | POA: Diagnosis not present

## 2019-11-23 ENCOUNTER — Other Ambulatory Visit: Payer: Medicare Other

## 2019-11-23 DIAGNOSIS — D472 Monoclonal gammopathy: Secondary | ICD-10-CM | POA: Diagnosis not present

## 2019-11-23 DIAGNOSIS — Z992 Dependence on renal dialysis: Secondary | ICD-10-CM | POA: Diagnosis not present

## 2019-11-23 DIAGNOSIS — D631 Anemia in chronic kidney disease: Secondary | ICD-10-CM | POA: Diagnosis not present

## 2019-11-23 DIAGNOSIS — N2581 Secondary hyperparathyroidism of renal origin: Secondary | ICD-10-CM | POA: Diagnosis not present

## 2019-11-23 DIAGNOSIS — D509 Iron deficiency anemia, unspecified: Secondary | ICD-10-CM | POA: Diagnosis not present

## 2019-11-23 DIAGNOSIS — N186 End stage renal disease: Secondary | ICD-10-CM | POA: Diagnosis not present

## 2019-11-24 DIAGNOSIS — R278 Other lack of coordination: Secondary | ICD-10-CM | POA: Diagnosis not present

## 2019-11-24 DIAGNOSIS — M25531 Pain in right wrist: Secondary | ICD-10-CM | POA: Diagnosis not present

## 2019-11-24 DIAGNOSIS — G5601 Carpal tunnel syndrome, right upper limb: Secondary | ICD-10-CM | POA: Diagnosis not present

## 2019-11-25 DIAGNOSIS — D631 Anemia in chronic kidney disease: Secondary | ICD-10-CM | POA: Diagnosis not present

## 2019-11-25 DIAGNOSIS — D472 Monoclonal gammopathy: Secondary | ICD-10-CM | POA: Diagnosis not present

## 2019-11-25 DIAGNOSIS — N186 End stage renal disease: Secondary | ICD-10-CM | POA: Diagnosis not present

## 2019-11-25 DIAGNOSIS — N2581 Secondary hyperparathyroidism of renal origin: Secondary | ICD-10-CM | POA: Diagnosis not present

## 2019-11-25 DIAGNOSIS — D509 Iron deficiency anemia, unspecified: Secondary | ICD-10-CM | POA: Diagnosis not present

## 2019-11-25 DIAGNOSIS — Z992 Dependence on renal dialysis: Secondary | ICD-10-CM | POA: Diagnosis not present

## 2019-11-26 DIAGNOSIS — G5601 Carpal tunnel syndrome, right upper limb: Secondary | ICD-10-CM | POA: Diagnosis not present

## 2019-11-26 DIAGNOSIS — R278 Other lack of coordination: Secondary | ICD-10-CM | POA: Diagnosis not present

## 2019-11-26 DIAGNOSIS — M25531 Pain in right wrist: Secondary | ICD-10-CM | POA: Diagnosis not present

## 2019-11-28 ENCOUNTER — Encounter: Payer: Self-pay | Admitting: Nurse Practitioner

## 2019-11-28 ENCOUNTER — Other Ambulatory Visit: Payer: Self-pay

## 2019-11-28 ENCOUNTER — Ambulatory Visit (INDEPENDENT_AMBULATORY_CARE_PROVIDER_SITE_OTHER): Payer: Medicare Other | Admitting: Nurse Practitioner

## 2019-11-28 VITALS — BP 187/118 | HR 86 | Temp 97.5°F | Ht <= 58 in | Wt 102.8 lb

## 2019-11-28 DIAGNOSIS — R197 Diarrhea, unspecified: Secondary | ICD-10-CM

## 2019-11-28 DIAGNOSIS — K529 Noninfective gastroenteritis and colitis, unspecified: Secondary | ICD-10-CM

## 2019-11-28 DIAGNOSIS — D472 Monoclonal gammopathy: Secondary | ICD-10-CM | POA: Diagnosis not present

## 2019-11-28 DIAGNOSIS — R634 Abnormal weight loss: Secondary | ICD-10-CM | POA: Diagnosis not present

## 2019-11-28 DIAGNOSIS — I10 Essential (primary) hypertension: Secondary | ICD-10-CM | POA: Diagnosis not present

## 2019-11-28 DIAGNOSIS — D631 Anemia in chronic kidney disease: Secondary | ICD-10-CM | POA: Diagnosis not present

## 2019-11-28 DIAGNOSIS — Z992 Dependence on renal dialysis: Secondary | ICD-10-CM | POA: Diagnosis not present

## 2019-11-28 DIAGNOSIS — D638 Anemia in other chronic diseases classified elsewhere: Secondary | ICD-10-CM

## 2019-11-28 DIAGNOSIS — G8929 Other chronic pain: Secondary | ICD-10-CM | POA: Diagnosis not present

## 2019-11-28 DIAGNOSIS — N186 End stage renal disease: Secondary | ICD-10-CM | POA: Diagnosis not present

## 2019-11-28 DIAGNOSIS — D509 Iron deficiency anemia, unspecified: Secondary | ICD-10-CM | POA: Diagnosis not present

## 2019-11-28 DIAGNOSIS — N2581 Secondary hyperparathyroidism of renal origin: Secondary | ICD-10-CM | POA: Diagnosis not present

## 2019-11-28 MED ORDER — ALIGN PO CAPS: 1.0000 | ORAL_CAPSULE | Freq: Every day | ORAL | 1 refills | Status: DC

## 2019-11-28 NOTE — Progress Notes (Signed)
Referring Provider: Hilbert Corrigan* Primary Care Physician:  Hilbert Corrigan, MD Primary GI:  Dr. Abbey Chatters  Chief Complaint  Patient presents with  . HFU    HPI:   [name] is a 69 y.o. female who presents for hospital follow-up.  Patient was admitted to the hospital from 10/20/2019 through 10/22/2019.  History of end-stage renal disease on hemodialysis, HIV, hypertension, chronic pain syndrome, resides in a nursing home.  She presented with a 2 to 3-day history of abdominal pain and diarrhea since eating a meal of macaroni and cheese.  A couple small episodes of dark stools.  Nausea but no vomiting.  Unable to attend dialysis due to her symptoms.  Noted severe colitis on CT imaging, stool studies not sent due to no significant stool output during hospitalization.  She was treated with Cipro and Flagyl and she improved with an advance diet and was subsequently discharged back to the facility.  Reviewed previous records and the patient did have an upper endoscopy in 2012, but no history of colonoscopy in our system.  During hospitalization also noted dilated common bile duct that appears to be chronic and recommended consider outpatient MRCP versus CT abdomen with pancreatic protocol.  Today she states she is doing okay overall. She recently had carpel tunnel surgery and her wrist is a bit sore, planned follow-up and stitch removal in a week. No further abdominal pain. Still with occasional diarrhea. Last episode of bleeding was prior to hospitalization. Stools were normal yesterday. Denies N/V, melena, fever, chills, unintentional weight loss. Ger HD T, R, Sa. Denies URI or flu-like symptoms. Denies loss of sense of taste or smell. The patient has received COVID-19 vaccination(s). Denies chest pain, dyspnea, dizziness, lightheadedness, syncope, near syncope. Denies any other upper or lower GI symptoms.  She states she isn't sure if she's ever had a colonoscopy.  Past  Medical History:  Diagnosis Date  . Anemia   . Anxiety   . Atrial flutter ([place]) 02/22/2015  . C. difficile colitis 10/10/11  . Chronic diarrhea   . Chronic pain   . Depression   . ESRD on hemodialysis [facility])    Dialysis [facility]  . GERD (gastroesophageal reflux disease)   . HIV (human immunodeficiency virus infection) ([place])   . Hypertension   . Insomnia   . Intracranial hemorrhage ([place])   . Muscle weakness (generalized)   . Pulmonary HTN ([place])   . Tachycardia   . TIA (transient ischemic attack)    [facility] do not have this dx  . Traumatic hematoma of right upper arm 02/22/2015    Past Surgical History:  Procedure Laterality Date  . A/V FISTULAGRAM N/A 04/17/2016   Procedure: A/V Fistulagram - Right Upper;  Surgeon: Waynetta Sandy, MD;  Location: [place] [facility];  Service: Cardiovascular;  Laterality: N/A;  . A/V FISTULAGRAM Right 08/28/2019   Procedure: A/V FISTULAGRAM;  Surgeon: Waynetta Sandy, MD;  Location: [place] CV [facility];  Service: Cardiovascular;  Laterality: Right;  . ABDOMINAL HYSTERECTOMY    . BIOPSY THYROID    . CARPAL TUNNEL RELEASE Right 11/16/2019   Procedure: CARPAL TUNNEL RELEASE;  Surgeon: Leanora Cover, MD;  Location: [place];  Service: Orthopedics;  Laterality: Right;  . Dialysis Shunts     previous one removed from left arm and now present one in right arm  . DIALYSIS/PERMA CATHETER INSERTION Right 04/17/2016   Procedure: dialysis Catheter Insertion central veinous;  Surgeon: Erlene Quan  Dione Plover, MD;  Location: [place] CV [facility];  Service: Cardiovascular;  Laterality: Right;  . ESOPHAGOGASTRODUODENOSCOPY  12/15/2010   [place]: erosive reflux esophagitis, MW tear, hiatal hernia (small), gastritis with no h.pylori, possibly nsaid related  . EXCISION OF BREAST BIOPSY Right 05/04/2012   Procedure: EXCISION OF BREAST BIOPSY;  Surgeon: Donato Heinz, MD;  Location: [facility];  Service: General;  Laterality: Right;  Right  Excisional Breast Biopsy  . FISTULOGRAM Right 03/26/2014   Procedure: Right Arm Fistulogram with Venoplasty Right Subclavian Vein and Inominate Vein. Debridement Fistula Ulcer;  Surgeon: Conrad Steptoe, MD;  Location: [place];  Service: Vascular;  Laterality: Right;  . FISTULOGRAM Right 03/29/2017   Procedure: FISTULOGRAM COMPLEX RIGHT ARM with Balloon angioplasty;  Surgeon: Waynetta Sandy, MD;  Location: [place];  Service: Vascular;  Laterality: Right;  . IR GENERIC HISTORICAL  05/19/2016   IR REMOVAL TUN CV CATH W/O FL 05/19/2016 Markus Daft, MD [facility]-INTERV RAD  . PERIPHERAL VASCULAR BALLOON ANGIOPLASTY Right 04/17/2016   Procedure: Peripheral Vascular Balloon Angioplasty;  Surgeon: Waynetta Sandy, MD;  Location: [place] [facility];  Service: Cardiovascular;  Laterality: Right;  Central segment AV Fistula  . PERIPHERAL VASCULAR BALLOON ANGIOPLASTY Right 03/14/2018   Procedure: PERIPHERAL VASCULAR BALLOON ANGIOPLASTY;  Surgeon: Waynetta Sandy, MD;  Location: [place] CV [facility];  Service: Cardiovascular;  Laterality: Right;  subclavian vein  . PERIPHERAL VASCULAR BALLOON ANGIOPLASTY Right 08/28/2019   Procedure: PERIPHERAL VASCULAR BALLOON ANGIOPLASTY;  Surgeon: Waynetta Sandy, MD;  Location: [place] [facility];  Service: Cardiovascular;  Laterality: Right;  upper arm  . PERIPHERAL VASCULAR INTERVENTION Right 03/14/2018   Procedure: PERIPHERAL VASCULAR INTERVENTION;  Surgeon: Waynetta Sandy, MD;  Location: [place] [facility];  Service: Cardiovascular;  Laterality: Right;  subclavian vein  . SHUNTOGRAM Right 10/19/2013   Procedure: FISTULOGRAM;  Surgeon: Conrad [place], MD;  Location: [facility] CATH LAB;  Service: Cardiovascular;  Laterality: Right;  . TONSILLECTOMY    . VISCERAL VENOGRAPHY Right 03/14/2018   Procedure: CENTRAL VENOGRAPHY;  Surgeon: Waynetta Sandy, MD;  Location: [place] [facility];  Service: Cardiovascular;  Laterality: Right;  upper ext fistula     Current Outpatient Medications  Medication Sig Dispense Refill  . Biotin 5000 MCG CAPS Take 5,000 mcg by mouth daily. (0800)    . calcium acetate, Phos Binder, (PHOSLYRA) 667 MG/5ML SOLN Take 2,001 mg by mouth 3 (three) times daily with meals. (0800, 1200, & 2000)    . calcium elemental as carbonate (BARIATRIC TUMS ULTRA) 400 MG chewable tablet Chew 1,000 mg by mouth in the morning and at bedtime. (0800 & 2000)    . clopidogrel (PLAVIX) 75 MG tablet Take 1 tablet (75 mg total) by mouth daily. (Patient taking differently: Take 75 mg by mouth daily. (0800)) 30 tablet 3  . cyanocobalamin (,VITAMIN B-12,) 1000 MCG/ML injection Inject 1 mL (1,000 mcg total) into the muscle every 28 (twenty-eight) days. 1 mL 12  . dolutegravir (TIVICAY) 50 MG tablet Take 50 mg by mouth daily at 4 PM.     . escitalopram (LEXAPRO) 10 MG tablet Take 10 mg by mouth daily. (0800)    . fentaNYL (DURAGESIC) 50 MCG/HR Place 1 patch onto the skin every 3 (three) days.    Marland Kitchen lamivudine (EPIVIR) 100 MG tablet Take 50 mg by mouth daily. (0800)    . lanolin/mineral oil (KERI/THERA-DERM) LOTN Apply 1 application topically every 12 (twelve) hours as needed for dry skin.     Marland Kitchen  loperamide (IMODIUM) 2 MG capsule Take by mouth as needed for diarrhea or loose stools.    Marland Kitchen loratadine (CLARITIN) 10 MG tablet Take 10 mg by mouth daily as needed for allergies.    . methadone (DOLOPHINE) 5 MG tablet Take 2.5 mg by mouth daily at 8 pm.     . mirtazapine (REMERON) 15 MG tablet Take 15 mg by mouth at bedtime. (2000)    . Multiple Vitamin (MULTIVITAMIN WITH MINERALS) TABS tablet Take 1 tablet by mouth at bedtime. (2000)    . nitroGLYCERIN (NITROSTAT) 0.4 MG SL tablet Place 0.4 mg under the tongue every 5 (five) minutes x 3 doses as needed for chest pain.     Marland Kitchen ondansetron (ZOFRAN-ODT) 4 MG disintegrating tablet Take 4 mg by mouth every 8 (eight) hours as needed for nausea or vomiting.     . Oxycodone HCl 10 MG TABS Take 1 tablet (10 mg total)  by mouth every 12 (twelve) hours as needed (severe pain). 5 tablet 0  . promethazine (PHENERGAN) 12.5 MG tablet Take 12.5 mg by mouth See admin instructions. Take 12.5 mg by mouth in the morning every Tuesday, Thursday and Saturday for nausea/vomiting and every 6 hours as needed.    . ranitidine (ZANTAC) 150 MG capsule Take 150 mg by mouth 2 (two) times daily.    Marland Kitchen trolamine salicylate (ASPERCREME) 10 % cream Apply 1 application topically as needed for muscle pain.     . vitamin C (ASCORBIC ACID) 500 MG tablet Take 500 mg by mouth 2 (two) times daily. (0900 & 2100)    . zidovudine (RETROVIR) 100 MG capsule Take 100 mg by mouth 3 (three) times daily. (0900, 1300, & 2100)    . Zinc 220 (50 Zn) MG CAPS Take 220 mg by mouth daily. (0800)     No current facility-administered medications for this visit.   Facility-Administered Medications Ordered in Other Visits  Medication Dose Route Frequency Provider Last Rate Last Admin  . 0.9 %  sodium chloride infusion (Manually program via Guardrails IV Fluids)  250 mL Intravenous Once Lockamy, Randi L, NP-C      . acetaminophen (TYLENOL) tablet 650 mg  650 mg Oral Once Lockamy, Randi L, NP-C      . diphenhydrAMINE (BENADRYL) capsule 25 mg  25 mg Oral Once Lockamy, Randi L, NP-C      . sodium chloride flush (NS) 0.9 % injection 10 mL  10 mL Intracatheter PRN Lockamy, Randi L, NP-C        Allergies as of 11/28/2019 - Review Complete 11/28/2019  Allergen Reaction Noted  . Lactose intolerance (gi) Other (See Comments) 03/29/2017  . Latex Rash and Itching 03/09/2011  . Penicillins Rash and Swelling 04/02/2011    Family History  Problem Relation Age of Onset  . Anesthesia problems Neg Hx   . Hypotension Neg Hx   . Malignant hyperthermia Neg Hx   . Pseudochol deficiency Neg Hx   . Colon cancer Neg Hx        Unsure of parents who both died when she was a baby    Social History   Socioeconomic History  . Marital status: Widowed    Spouse name: Not on  file  . Number of children: 1  . Years of education: Not on file  . Highest education level: Not on file  Occupational History  . Not on file  Tobacco Use  . Smoking status: Former Smoker    Types: Cigarettes    Quit  date: 03/12/1983    Years since quitting: 36.7  . Smokeless tobacco: Never Used  Vaping Use  . Vaping Use: Never used  Substance and Sexual Activity  . Alcohol use: No    Alcohol/week: 0.0 standard drinks  . Drug use: No  . Sexual activity: Not Currently    Birth control/protection: Surgical    Comment: Hysterectomy  Other Topics Concern  . Not on file  Social History Narrative  . Not on file   Social Determinants of Health   Financial Resource Strain:   . Difficulty of Paying Living Expenses: Not on file  Food Insecurity:   . Worried About Charity fundraiser in the Last Year: Not on file  . Ran Out of Food in the Last Year: Not on file  Transportation Needs:   . Lack of Transportation (Medical): Not on file  . Lack of Transportation (Non-Medical): Not on file  Physical Activity:   . Days of Exercise per Week: Not on file  . Minutes of Exercise per Session: Not on file  Stress:   . Feeling of Stress : Not on file  Social Connections:   . Frequency of Communication with Friends and Family: Not on file  . Frequency of Social Gatherings with Friends and Family: Not on file  . Attends Religious Services: Not on file  . Active Member of Clubs or Organizations: Not on file  . Attends Archivist Meetings: Not on file  . Marital Status: Not on file    Subjective: Review of Systems  Constitutional: Negative for chills, fever, malaise/fatigue and weight loss.  HENT: Negative for congestion and sore throat.   Respiratory: Negative for cough and shortness of breath.   Cardiovascular: Negative for chest pain and palpitations.  Gastrointestinal: Positive for diarrhea (infrequent at this point). Negative for abdominal pain, blood in stool, melena,  nausea and vomiting.  Musculoskeletal: Negative for joint pain and myalgias.       Right wrist pain s/p surgery  Skin: Negative for rash.  Neurological: Negative for dizziness and weakness.  Endo/Heme/Allergies: Does not bruise/bleed easily.  Psychiatric/Behavioral: Negative for depression. The patient is not nervous/anxious.   All other systems reviewed and are negative.    Objective: BP (!) 187/118   Pulse 86   Temp (!) 97.5 F (36.4 C) (Oral)   Ht _0  (1.473 m)   Wt 102 lb 12.8 oz (46.6 kg)   LMP  (LMP Unknown)   BMI 21.49 kg/m  Physical Exam Vitals and nursing note reviewed.  Constitutional:      General: She is not in acute distress.    Appearance: Normal appearance. She is well-developed and normal weight. She is not ill-appearing, toxic-appearing or diaphoretic.     Comments: In wheelchair  HENT:     Head: Normocephalic and atraumatic.     Nose: No congestion or rhinorrhea.  Eyes:     General: No scleral icterus. Cardiovascular:     Rate and Rhythm: Normal rate. Rhythm irregular.     Heart sounds: Normal heart sounds.  Pulmonary:     Effort: Pulmonary effort is normal. No respiratory distress.     Breath sounds: Normal breath sounds.  Abdominal:     General: Bowel sounds are normal.     Palpations: Abdomen is soft. There is no hepatomegaly, splenomegaly or mass.     Tenderness: There is no abdominal tenderness. There is no guarding or rebound.     Hernia: No hernia is present.  Musculoskeletal:  Right wrist: Tenderness present.     Comments: Right wrist in dressing s/p carpel tunnel surgery  Skin:    General: Skin is warm and dry.     Coloration: Skin is not jaundiced.     Findings: No rash.  Neurological:     General: No focal deficit present.     Mental Status: She is alert and oriented to person, place, and time.  Psychiatric:        Attention and Perception: Attention normal.        Mood and Affect: Mood normal.        Speech: Speech normal.         Behavior: Behavior normal.        Thought Content: Thought content normal.        Cognition and Memory: Cognition and memory normal.      Assessment:  Very pleasant 69 year old female presents for hospital follow-up when we were not consulted for simple colitis.  Colitis seen severe on CT imaging with colon wall thickening as per above.  At this point her symptoms have resolved with no further abdominal pain.  She does have infrequent diarrhea since hospital discharge.  She completed her antibiotics.  She was having hematochezia leading up to her hospitalization but none since discharge.  She has not had a colonoscopy before that she is aware of.  We will need endoluminal evaluation of her colon given her abnormal imaging, hematochezia, no history of colonoscopy.  She would likely benefit from probiotics for rare looser stools.  Further recommendations will follow the procedure  Proceed with TCS with Dr. Abbey Chatters on propofol/MAC in near future: the risks, benefits, and alternatives have been discussed with the patient in detail. The patient states understanding and desires to proceed.  ASA III  Plan to hold Plavix for 5 days, check with the prescriber.  TriLyte bowel prep (MiraLAX or TriLyte unavailable).  Tapwater enemas without fleets due to end-stage renal.   Plan: 1. Colonoscopy to be scheduled 2. Okay for MiraLAX of TriLyte on back order 3. Due to end-stage renal disease recommend tap water enema rather than fleets enema 4. We will hold Plavix for 5 days prior to her procedure 5. Start a probiotic for 1 to 2 months 6. Follow-up for any worsening or recurrent symptoms.    Thank you for allowing [place] to participate in the care of [place], DNP, AGNP-C Adult & Gerontological Nurse Practitioner [facility]   11/28/2019 8:23 AM   Disclaimer: This note was dictated with voice recognition software. Similar sounding words can  inadvertently be transcribed and may not be corrected upon review.

## 2019-11-28 NOTE — Patient Instructions (Signed)
Your health issues we discussed today were:   Recent colitis (colon infection) with abnormal CT scan: 1. We will schedule colonoscopy as we discussed 2. Further recommendations will follow your colonoscopy 3. Start taking a probiotic for the next 1 to 2 months to help with occasional loose stools 4. We will ask your primary care provider or cardiologist for the okay to hold your Plavix for 5 days prior to your procedure 5. Call for any worsening or severe symptoms.  Overall I recommend:  1. Continue your other current medications 2. Return for follow-up in 3 months 3. Call us if you have any questions or concerns   ---------------------------------------------------------------  I am glad you have gotten your COVID-19 vaccination!  Even though you are fully vaccinated you should continue to follow CDC and state/local guidelines.  ---------------------------------------------------------------   At [facility] we value your feedback. You may receive a survey about your visit today. Please share your experience as we strive to create trusting relationships with our patients to provide genuine, compassionate, quality care.  We appreciate your understanding and patience as we review any laboratory studies, imaging, and other diagnostic tests that are ordered as we care for you. Our office policy is 5 business days for review of these results, and any emergent or urgent results are addressed in a timely manner for your best interest. If you do not hear from our office in 1 week, please contact us.   We also encourage the use of MyChart, which contains your medical information for your review as well. If you are not enrolled in this feature, an access code is on this after visit summary for your convenience. Thank you for allowing [place] to be involved in your care.  It was great to see you today!  I hope you have a great Fall!!

## 2019-11-29 DIAGNOSIS — R278 Other lack of coordination: Secondary | ICD-10-CM | POA: Diagnosis not present

## 2019-11-29 DIAGNOSIS — G5601 Carpal tunnel syndrome, right upper limb: Secondary | ICD-10-CM | POA: Diagnosis not present

## 2019-11-29 DIAGNOSIS — M25531 Pain in right wrist: Secondary | ICD-10-CM | POA: Diagnosis not present

## 2019-11-30 DIAGNOSIS — D472 Monoclonal gammopathy: Secondary | ICD-10-CM | POA: Diagnosis not present

## 2019-11-30 DIAGNOSIS — D509 Iron deficiency anemia, unspecified: Secondary | ICD-10-CM | POA: Diagnosis not present

## 2019-11-30 DIAGNOSIS — Z992 Dependence on renal dialysis: Secondary | ICD-10-CM | POA: Diagnosis not present

## 2019-11-30 DIAGNOSIS — N186 End stage renal disease: Secondary | ICD-10-CM | POA: Diagnosis not present

## 2019-11-30 DIAGNOSIS — N2581 Secondary hyperparathyroidism of renal origin: Secondary | ICD-10-CM | POA: Diagnosis not present

## 2019-11-30 DIAGNOSIS — D631 Anemia in chronic kidney disease: Secondary | ICD-10-CM | POA: Diagnosis not present

## 2019-12-01 ENCOUNTER — Telehealth: Payer: Self-pay

## 2019-12-01 DIAGNOSIS — R278 Other lack of coordination: Secondary | ICD-10-CM | POA: Diagnosis not present

## 2019-12-01 DIAGNOSIS — G5603 Carpal tunnel syndrome, bilateral upper limbs: Secondary | ICD-10-CM | POA: Diagnosis not present

## 2019-12-01 DIAGNOSIS — G5601 Carpal tunnel syndrome, right upper limb: Secondary | ICD-10-CM | POA: Diagnosis not present

## 2019-12-01 NOTE — Telephone Encounter (Signed)
Dr. Donzetta Matters, pt was seen in our office 11/28/19 by [name], PA. Pt needs a colonoscopy with Dr. Abbey Chatters. Is it ok to hold Plavix x 5 days prior to procedure? Please advise.

## 2019-12-02 DIAGNOSIS — D509 Iron deficiency anemia, unspecified: Secondary | ICD-10-CM | POA: Diagnosis not present

## 2019-12-02 DIAGNOSIS — N186 End stage renal disease: Secondary | ICD-10-CM | POA: Diagnosis not present

## 2019-12-02 DIAGNOSIS — D631 Anemia in chronic kidney disease: Secondary | ICD-10-CM | POA: Diagnosis not present

## 2019-12-02 DIAGNOSIS — D472 Monoclonal gammopathy: Secondary | ICD-10-CM | POA: Diagnosis not present

## 2019-12-02 DIAGNOSIS — N2581 Secondary hyperparathyroidism of renal origin: Secondary | ICD-10-CM | POA: Diagnosis not present

## 2019-12-02 DIAGNOSIS — Z23 Encounter for immunization: Secondary | ICD-10-CM | POA: Diagnosis not present

## 2019-12-02 DIAGNOSIS — R110 Nausea: Secondary | ICD-10-CM | POA: Diagnosis not present

## 2019-12-02 DIAGNOSIS — Z992 Dependence on renal dialysis: Secondary | ICD-10-CM | POA: Diagnosis not present

## 2019-12-04 ENCOUNTER — Encounter: Payer: Self-pay | Admitting: *Deleted

## 2019-12-04 ENCOUNTER — Telehealth: Payer: Self-pay | Admitting: *Deleted

## 2019-12-04 MED ORDER — PEG 3350-KCL-NA BICARB-NACL 420 G PO SOLR: ORAL | 0 refills | Status: DC

## 2019-12-04 NOTE — Telephone Encounter (Signed)
-----   Message from Derrick Ravel, [place] sent at 12/04/2019  7:51 AM EDT -----  ----- Message ----- From: Waynetta Sandy, MD Sent: 12/01/2019   8:24 PM EDT To: Derrick Ravel, CMA  Ok to hold plavix from my standpoint.   Erlene Quan cain

## 2019-12-04 NOTE — Telephone Encounter (Signed)
[place] and spoke with transportation. TCS with Dr. Abbey Chatters, asa 3 scheduled for 11/2 at 1:15pm. Instructions to be faxed to [phone].

## 2019-12-04 NOTE — Telephone Encounter (Signed)
Noted, thanks!

## 2019-12-05 DIAGNOSIS — N2581 Secondary hyperparathyroidism of renal origin: Secondary | ICD-10-CM | POA: Diagnosis not present

## 2019-12-05 DIAGNOSIS — D509 Iron deficiency anemia, unspecified: Secondary | ICD-10-CM | POA: Diagnosis not present

## 2019-12-05 DIAGNOSIS — R110 Nausea: Secondary | ICD-10-CM | POA: Diagnosis not present

## 2019-12-05 DIAGNOSIS — Z23 Encounter for immunization: Secondary | ICD-10-CM | POA: Diagnosis not present

## 2019-12-05 DIAGNOSIS — N186 End stage renal disease: Secondary | ICD-10-CM | POA: Diagnosis not present

## 2019-12-05 DIAGNOSIS — D472 Monoclonal gammopathy: Secondary | ICD-10-CM | POA: Diagnosis not present

## 2019-12-06 DIAGNOSIS — G5603 Carpal tunnel syndrome, bilateral upper limbs: Secondary | ICD-10-CM | POA: Diagnosis not present

## 2019-12-06 DIAGNOSIS — G5601 Carpal tunnel syndrome, right upper limb: Secondary | ICD-10-CM | POA: Diagnosis not present

## 2019-12-06 DIAGNOSIS — R278 Other lack of coordination: Secondary | ICD-10-CM | POA: Diagnosis not present

## 2019-12-07 ENCOUNTER — Encounter: Payer: Medicare Other | Admitting: Infectious Diseases

## 2019-12-07 DIAGNOSIS — H25813 Combined forms of age-related cataract, bilateral: Secondary | ICD-10-CM | POA: Diagnosis not present

## 2019-12-07 DIAGNOSIS — R110 Nausea: Secondary | ICD-10-CM | POA: Diagnosis not present

## 2019-12-07 DIAGNOSIS — N186 End stage renal disease: Secondary | ICD-10-CM | POA: Diagnosis not present

## 2019-12-07 DIAGNOSIS — D509 Iron deficiency anemia, unspecified: Secondary | ICD-10-CM | POA: Diagnosis not present

## 2019-12-07 DIAGNOSIS — N2581 Secondary hyperparathyroidism of renal origin: Secondary | ICD-10-CM | POA: Diagnosis not present

## 2019-12-07 DIAGNOSIS — H524 Presbyopia: Secondary | ICD-10-CM | POA: Diagnosis not present

## 2019-12-07 DIAGNOSIS — N189 Chronic kidney disease, unspecified: Secondary | ICD-10-CM | POA: Diagnosis not present

## 2019-12-07 DIAGNOSIS — Z23 Encounter for immunization: Secondary | ICD-10-CM | POA: Diagnosis not present

## 2019-12-07 DIAGNOSIS — D472 Monoclonal gammopathy: Secondary | ICD-10-CM | POA: Diagnosis not present

## 2019-12-07 DIAGNOSIS — B20 Human immunodeficiency virus [HIV] disease: Secondary | ICD-10-CM | POA: Diagnosis not present

## 2019-12-08 DIAGNOSIS — R278 Other lack of coordination: Secondary | ICD-10-CM | POA: Diagnosis not present

## 2019-12-08 DIAGNOSIS — G5601 Carpal tunnel syndrome, right upper limb: Secondary | ICD-10-CM | POA: Diagnosis not present

## 2019-12-08 DIAGNOSIS — G5603 Carpal tunnel syndrome, bilateral upper limbs: Secondary | ICD-10-CM | POA: Diagnosis not present

## 2019-12-09 DIAGNOSIS — N186 End stage renal disease: Secondary | ICD-10-CM | POA: Diagnosis not present

## 2019-12-09 DIAGNOSIS — Z23 Encounter for immunization: Secondary | ICD-10-CM | POA: Diagnosis not present

## 2019-12-09 DIAGNOSIS — N2581 Secondary hyperparathyroidism of renal origin: Secondary | ICD-10-CM | POA: Diagnosis not present

## 2019-12-09 DIAGNOSIS — D472 Monoclonal gammopathy: Secondary | ICD-10-CM | POA: Diagnosis not present

## 2019-12-09 DIAGNOSIS — R110 Nausea: Secondary | ICD-10-CM | POA: Diagnosis not present

## 2019-12-09 DIAGNOSIS — D509 Iron deficiency anemia, unspecified: Secondary | ICD-10-CM | POA: Diagnosis not present

## 2019-12-11 DIAGNOSIS — R278 Other lack of coordination: Secondary | ICD-10-CM | POA: Diagnosis not present

## 2019-12-11 DIAGNOSIS — G5601 Carpal tunnel syndrome, right upper limb: Secondary | ICD-10-CM | POA: Diagnosis not present

## 2019-12-11 DIAGNOSIS — G5603 Carpal tunnel syndrome, bilateral upper limbs: Secondary | ICD-10-CM | POA: Diagnosis not present

## 2019-12-12 ENCOUNTER — Ambulatory Visit: Payer: Medicare Other | Admitting: Infectious Diseases

## 2019-12-12 DIAGNOSIS — D472 Monoclonal gammopathy: Secondary | ICD-10-CM | POA: Diagnosis not present

## 2019-12-12 DIAGNOSIS — Z23 Encounter for immunization: Secondary | ICD-10-CM | POA: Diagnosis not present

## 2019-12-12 DIAGNOSIS — N2581 Secondary hyperparathyroidism of renal origin: Secondary | ICD-10-CM | POA: Diagnosis not present

## 2019-12-12 DIAGNOSIS — N186 End stage renal disease: Secondary | ICD-10-CM | POA: Diagnosis not present

## 2019-12-12 DIAGNOSIS — R110 Nausea: Secondary | ICD-10-CM | POA: Diagnosis not present

## 2019-12-12 DIAGNOSIS — D509 Iron deficiency anemia, unspecified: Secondary | ICD-10-CM | POA: Diagnosis not present

## 2019-12-13 DIAGNOSIS — F5101 Primary insomnia: Secondary | ICD-10-CM | POA: Diagnosis not present

## 2019-12-13 DIAGNOSIS — G5601 Carpal tunnel syndrome, right upper limb: Secondary | ICD-10-CM | POA: Diagnosis not present

## 2019-12-13 DIAGNOSIS — R278 Other lack of coordination: Secondary | ICD-10-CM | POA: Diagnosis not present

## 2019-12-13 DIAGNOSIS — F331 Major depressive disorder, recurrent, moderate: Secondary | ICD-10-CM | POA: Diagnosis not present

## 2019-12-13 DIAGNOSIS — G5603 Carpal tunnel syndrome, bilateral upper limbs: Secondary | ICD-10-CM | POA: Diagnosis not present

## 2019-12-14 DIAGNOSIS — N186 End stage renal disease: Secondary | ICD-10-CM | POA: Diagnosis not present

## 2019-12-14 DIAGNOSIS — D509 Iron deficiency anemia, unspecified: Secondary | ICD-10-CM | POA: Diagnosis not present

## 2019-12-14 DIAGNOSIS — Z23 Encounter for immunization: Secondary | ICD-10-CM | POA: Diagnosis not present

## 2019-12-14 DIAGNOSIS — N2581 Secondary hyperparathyroidism of renal origin: Secondary | ICD-10-CM | POA: Diagnosis not present

## 2019-12-14 DIAGNOSIS — D472 Monoclonal gammopathy: Secondary | ICD-10-CM | POA: Diagnosis not present

## 2019-12-14 DIAGNOSIS — R110 Nausea: Secondary | ICD-10-CM | POA: Diagnosis not present

## 2019-12-15 DIAGNOSIS — G5601 Carpal tunnel syndrome, right upper limb: Secondary | ICD-10-CM | POA: Diagnosis not present

## 2019-12-15 DIAGNOSIS — G5603 Carpal tunnel syndrome, bilateral upper limbs: Secondary | ICD-10-CM | POA: Diagnosis not present

## 2019-12-15 DIAGNOSIS — R278 Other lack of coordination: Secondary | ICD-10-CM | POA: Diagnosis not present

## 2019-12-16 DIAGNOSIS — D509 Iron deficiency anemia, unspecified: Secondary | ICD-10-CM | POA: Diagnosis not present

## 2019-12-16 DIAGNOSIS — Z23 Encounter for immunization: Secondary | ICD-10-CM | POA: Diagnosis not present

## 2019-12-16 DIAGNOSIS — N2581 Secondary hyperparathyroidism of renal origin: Secondary | ICD-10-CM | POA: Diagnosis not present

## 2019-12-16 DIAGNOSIS — D472 Monoclonal gammopathy: Secondary | ICD-10-CM | POA: Diagnosis not present

## 2019-12-16 DIAGNOSIS — N186 End stage renal disease: Secondary | ICD-10-CM | POA: Diagnosis not present

## 2019-12-16 DIAGNOSIS — R110 Nausea: Secondary | ICD-10-CM | POA: Diagnosis not present

## 2019-12-18 DIAGNOSIS — G5601 Carpal tunnel syndrome, right upper limb: Secondary | ICD-10-CM | POA: Diagnosis not present

## 2019-12-18 DIAGNOSIS — G5603 Carpal tunnel syndrome, bilateral upper limbs: Secondary | ICD-10-CM | POA: Diagnosis not present

## 2019-12-18 DIAGNOSIS — R278 Other lack of coordination: Secondary | ICD-10-CM | POA: Diagnosis not present

## 2019-12-19 DIAGNOSIS — G8929 Other chronic pain: Secondary | ICD-10-CM | POA: Diagnosis not present

## 2019-12-19 DIAGNOSIS — R52 Pain, unspecified: Secondary | ICD-10-CM | POA: Diagnosis not present

## 2019-12-19 DIAGNOSIS — R110 Nausea: Secondary | ICD-10-CM | POA: Diagnosis not present

## 2019-12-19 DIAGNOSIS — Z79899 Other long term (current) drug therapy: Secondary | ICD-10-CM | POA: Diagnosis not present

## 2019-12-19 DIAGNOSIS — G5601 Carpal tunnel syndrome, right upper limb: Secondary | ICD-10-CM | POA: Diagnosis not present

## 2019-12-19 DIAGNOSIS — D509 Iron deficiency anemia, unspecified: Secondary | ICD-10-CM | POA: Diagnosis not present

## 2019-12-19 DIAGNOSIS — N186 End stage renal disease: Secondary | ICD-10-CM | POA: Diagnosis not present

## 2019-12-19 DIAGNOSIS — G5603 Carpal tunnel syndrome, bilateral upper limbs: Secondary | ICD-10-CM | POA: Diagnosis not present

## 2019-12-19 DIAGNOSIS — D472 Monoclonal gammopathy: Secondary | ICD-10-CM | POA: Diagnosis not present

## 2019-12-19 DIAGNOSIS — R278 Other lack of coordination: Secondary | ICD-10-CM | POA: Diagnosis not present

## 2019-12-19 DIAGNOSIS — N2581 Secondary hyperparathyroidism of renal origin: Secondary | ICD-10-CM | POA: Diagnosis not present

## 2019-12-19 DIAGNOSIS — Z23 Encounter for immunization: Secondary | ICD-10-CM | POA: Diagnosis not present

## 2019-12-20 DIAGNOSIS — R278 Other lack of coordination: Secondary | ICD-10-CM | POA: Diagnosis not present

## 2019-12-20 DIAGNOSIS — N186 End stage renal disease: Secondary | ICD-10-CM | POA: Diagnosis not present

## 2019-12-20 DIAGNOSIS — G8929 Other chronic pain: Secondary | ICD-10-CM | POA: Diagnosis not present

## 2019-12-20 DIAGNOSIS — G5603 Carpal tunnel syndrome, bilateral upper limbs: Secondary | ICD-10-CM | POA: Diagnosis not present

## 2019-12-20 DIAGNOSIS — G5601 Carpal tunnel syndrome, right upper limb: Secondary | ICD-10-CM | POA: Diagnosis not present

## 2019-12-20 DIAGNOSIS — I10 Essential (primary) hypertension: Secondary | ICD-10-CM | POA: Diagnosis not present

## 2019-12-20 DIAGNOSIS — Z992 Dependence on renal dialysis: Secondary | ICD-10-CM | POA: Diagnosis not present

## 2019-12-21 DIAGNOSIS — R110 Nausea: Secondary | ICD-10-CM | POA: Diagnosis not present

## 2019-12-21 DIAGNOSIS — Z23 Encounter for immunization: Secondary | ICD-10-CM | POA: Diagnosis not present

## 2019-12-21 DIAGNOSIS — D472 Monoclonal gammopathy: Secondary | ICD-10-CM | POA: Diagnosis not present

## 2019-12-21 DIAGNOSIS — N186 End stage renal disease: Secondary | ICD-10-CM | POA: Diagnosis not present

## 2019-12-21 DIAGNOSIS — N2581 Secondary hyperparathyroidism of renal origin: Secondary | ICD-10-CM | POA: Diagnosis not present

## 2019-12-21 DIAGNOSIS — D509 Iron deficiency anemia, unspecified: Secondary | ICD-10-CM | POA: Diagnosis not present

## 2019-12-22 ENCOUNTER — Ambulatory Visit (INDEPENDENT_AMBULATORY_CARE_PROVIDER_SITE_OTHER): Payer: Medicare Other | Admitting: Infectious Diseases

## 2019-12-22 ENCOUNTER — Encounter: Payer: Self-pay | Admitting: Infectious Diseases

## 2019-12-22 ENCOUNTER — Other Ambulatory Visit: Payer: Self-pay

## 2019-12-22 VITALS — BP 177/82 | HR 73

## 2019-12-22 DIAGNOSIS — Z113 Encounter for screening for infections with a predominantly sexual mode of transmission: Secondary | ICD-10-CM | POA: Diagnosis not present

## 2019-12-22 DIAGNOSIS — I10 Essential (primary) hypertension: Secondary | ICD-10-CM

## 2019-12-22 DIAGNOSIS — Z992 Dependence on renal dialysis: Secondary | ICD-10-CM

## 2019-12-22 DIAGNOSIS — B20 Human immunodeficiency virus [HIV] disease: Secondary | ICD-10-CM | POA: Diagnosis not present

## 2019-12-22 DIAGNOSIS — Z79899 Other long term (current) drug therapy: Secondary | ICD-10-CM

## 2019-12-22 DIAGNOSIS — N186 End stage renal disease: Secondary | ICD-10-CM | POA: Diagnosis not present

## 2019-12-22 LAB — T-HELPER CELL (CD4) - (RCID CLINIC ONLY)
CD4 % Helper T Cell: 24 % — ABNORMAL LOW (ref 33–65)
CD4 T Cell Abs: 229 /uL — ABNORMAL LOW (ref 400–1790)

## 2019-12-22 NOTE — Assessment & Plan Note (Signed)
She is currently asx I will ask her PCP and renal MD to f/u.

## 2019-12-22 NOTE — Assessment & Plan Note (Signed)
She appears to be doing well She has had her covid and flu vax.  Her PCV is caught up, next in 5 years.  She continues to stay at ALF.  No change in her ART Labs today Will  See her back in 9 months.

## 2019-12-22 NOTE — Progress Notes (Signed)
   Subjective:    Patient ID: [name], female    DOB: 07-12-1950, 69 y.o.   MRN: [id number]  HPI 69yo F with HIV+, ESRD, diastolic CHF, parox aflutter, prev normal cath at [facility] (2016).staying at SNF [facility]). She is ontivicay/zidovudine/epivirdosed for her HD.No problems with these.   She was in hospital 2-15 to 2-18-18foroccluded right subclavian vein, with stenosis of right innominate vein. Patient seen by vascular surgery, pt had ballonangioplasty of right subclavian vein and innominate vein. Her RUE AVFwas again repaired 03-29-17. Her fistula currently is working well.   She has had carpal tunnel release. She states she is to have colonoscopy next month.   She has been feeling ok, has had COVID vax. States she has had COVID and flu vax.  She feels like they are taking good care of her at ALF.    HIV 1 RNA Quant (copies/mL)  Date Value  07/20/2019 <20  03/07/2019 <20 NOT DETECTED  10/19/2017 <20 NOT DETECTED   CD4 T Cell Abs (/uL)  Date Value  07/20/2019 169 (L)  10/19/2017 520  04/22/2017 450     Review of Systems  Constitutional: Negative for appetite change, chills, fever and unexpected weight change.  Respiratory: Positive for shortness of breath.   Cardiovascular: Negative for chest pain.  Gastrointestinal: Negative for constipation and diarrhea.  Genitourinary: Negative for difficulty urinating (minimal).  Neurological: Negative for headaches.       Objective:   Physical Exam Vitals reviewed.  Constitutional:      Appearance: Normal appearance.  HENT:     Mouth/Throat:     Mouth: Mucous membranes are moist.     Pharynx: No oropharyngeal exudate.  Eyes:     Extraocular Movements: Extraocular movements intact.     Pupils: Pupils are equal, round, and reactive to light.  Cardiovascular:     Rate and Rhythm: Normal rate and regular rhythm.  Pulmonary:     Effort: Pulmonary effort is normal.     Breath sounds: Normal breath sounds.    Abdominal:     General: Bowel sounds are normal. There is no distension.     Palpations: Abdomen is soft.     Tenderness: There is no abdominal tenderness.  Musculoskeletal:       Arms:     Cervical back: Normal range of motion and neck supple.     Right lower leg: No edema.     Left lower leg: No edema.  Neurological:     Mental Status: She is alert.  Psychiatric:        Mood and Affect: Mood normal.           Assessment & Plan:

## 2019-12-22 NOTE — Assessment & Plan Note (Signed)
Fistula looks healthy.  Appreciate her vasc f/u.

## 2019-12-25 DIAGNOSIS — G5601 Carpal tunnel syndrome, right upper limb: Secondary | ICD-10-CM | POA: Diagnosis not present

## 2019-12-25 DIAGNOSIS — G5603 Carpal tunnel syndrome, bilateral upper limbs: Secondary | ICD-10-CM | POA: Diagnosis not present

## 2019-12-25 DIAGNOSIS — R278 Other lack of coordination: Secondary | ICD-10-CM | POA: Diagnosis not present

## 2019-12-26 DIAGNOSIS — N186 End stage renal disease: Secondary | ICD-10-CM | POA: Diagnosis not present

## 2019-12-26 DIAGNOSIS — Z992 Dependence on renal dialysis: Secondary | ICD-10-CM | POA: Diagnosis not present

## 2019-12-26 DIAGNOSIS — D472 Monoclonal gammopathy: Secondary | ICD-10-CM | POA: Diagnosis not present

## 2019-12-26 DIAGNOSIS — D509 Iron deficiency anemia, unspecified: Secondary | ICD-10-CM | POA: Diagnosis not present

## 2019-12-26 DIAGNOSIS — N2581 Secondary hyperparathyroidism of renal origin: Secondary | ICD-10-CM | POA: Diagnosis not present

## 2019-12-26 DIAGNOSIS — Z23 Encounter for immunization: Secondary | ICD-10-CM | POA: Diagnosis not present

## 2019-12-26 DIAGNOSIS — R110 Nausea: Secondary | ICD-10-CM | POA: Diagnosis not present

## 2019-12-26 LAB — HIV-1 RNA QUANT-NO REFLEX-BLD
HIV 1 RNA Quant: <20 Copies/mL
HIV-1 RNA Quant, Log: <1.3 Log cps/mL

## 2019-12-26 LAB — CBC
HCT: 34.6 % — ABNORMAL LOW (ref 35.0–45.0)
Hemoglobin: 10.5 g/dL — ABNORMAL LOW (ref 11.7–15.5)
MCH: 31.2 pg (ref 27.0–33.0)
MCHC: 30.3 g/dL — ABNORMAL LOW (ref 32.0–36.0)
MCV: 102.7 fL — ABNORMAL HIGH (ref 80.0–100.0)
MPV: 10.1 fL (ref 7.5–12.5)
Platelets: 139 10*3/uL — ABNORMAL LOW (ref 140–400)
RBC: 3.37 10*6/uL — ABNORMAL LOW (ref 3.80–5.10)
RDW: 16.5 % — ABNORMAL HIGH (ref 11.0–15.0)
WBC: 2.9 10*3/uL — ABNORMAL LOW (ref 3.8–10.8)

## 2019-12-26 LAB — COMPREHENSIVE METABOLIC PANEL
AG Ratio: 1 (calc) (ref 1.0–2.5)
ALT: 8 U/L (ref 6–29)
AST: 13 U/L (ref 10–35)
Albumin: 3.8 g/dL (ref 3.6–5.1)
Alkaline phosphatase (APISO): 120 U/L (ref 37–153)
BUN/Creatinine Ratio: 4 (calc) — ABNORMAL LOW (ref 6–22)
BUN: 23 mg/dL (ref 7–25)
CO2: 28 mmol/L (ref 20–32)
Calcium: 8.9 mg/dL (ref 8.6–10.4)
Chloride: 103 mmol/L (ref 98–110)
Creat: 5.66 mg/dL — ABNORMAL HIGH (ref 0.50–0.99)
Globulin: 4 g/dL (calc) — ABNORMAL HIGH (ref 1.9–3.7)
Glucose, Bld: 80 mg/dL (ref 65–99)
Potassium: 4 mmol/L (ref 3.5–5.3)
Sodium: 140 mmol/L (ref 135–146)
Total Bilirubin: 0.5 mg/dL (ref 0.2–1.2)
Total Protein: 7.8 g/dL (ref 6.1–8.1)

## 2019-12-26 LAB — LIPID PANEL
Cholesterol: 134 mg/dL (ref <200)
HDL: 50 mg/dL (ref >=50)
LDL Cholesterol (Calc): 66 mg/dL (calc)
Non-HDL Cholesterol (Calc): 84 mg/dL (calc) (ref <130)
Total CHOL/HDL Ratio: 2.7 (calc) (ref <5.0)
Triglycerides: 94 mg/dL (ref <150)

## 2019-12-26 LAB — RPR: RPR Ser Ql: NONREACTIVE

## 2019-12-27 DIAGNOSIS — G5603 Carpal tunnel syndrome, bilateral upper limbs: Secondary | ICD-10-CM | POA: Diagnosis not present

## 2019-12-27 DIAGNOSIS — G5601 Carpal tunnel syndrome, right upper limb: Secondary | ICD-10-CM | POA: Diagnosis not present

## 2019-12-27 DIAGNOSIS — R278 Other lack of coordination: Secondary | ICD-10-CM | POA: Diagnosis not present

## 2019-12-28 DIAGNOSIS — D509 Iron deficiency anemia, unspecified: Secondary | ICD-10-CM | POA: Diagnosis not present

## 2019-12-28 DIAGNOSIS — N186 End stage renal disease: Secondary | ICD-10-CM | POA: Diagnosis not present

## 2019-12-28 DIAGNOSIS — R110 Nausea: Secondary | ICD-10-CM | POA: Diagnosis not present

## 2019-12-28 DIAGNOSIS — D472 Monoclonal gammopathy: Secondary | ICD-10-CM | POA: Diagnosis not present

## 2019-12-28 DIAGNOSIS — Z23 Encounter for immunization: Secondary | ICD-10-CM | POA: Diagnosis not present

## 2019-12-28 DIAGNOSIS — N2581 Secondary hyperparathyroidism of renal origin: Secondary | ICD-10-CM | POA: Diagnosis not present

## 2019-12-29 DIAGNOSIS — G5601 Carpal tunnel syndrome, right upper limb: Secondary | ICD-10-CM | POA: Diagnosis not present

## 2019-12-29 DIAGNOSIS — R278 Other lack of coordination: Secondary | ICD-10-CM | POA: Diagnosis not present

## 2019-12-29 DIAGNOSIS — G5603 Carpal tunnel syndrome, bilateral upper limbs: Secondary | ICD-10-CM | POA: Diagnosis not present

## 2019-12-29 NOTE — Patient Instructions (Signed)
[name]  12/29/2019     @PREFPERIOPPHARMACY @   Your procedure is scheduled on  01/02/2020.  Report to [facility] at  1000  A.M.  Call this number if you have problems the morning of surgery:  [phone]   Remember:  Follow the diet and prep instructions given to you by the office.                        Take these medicines the morning of surgery with A SIP OF WATER  Lexapro, pepcid, duragesic patch, epivir, zofran or phenergan (if needed), oxycodone (if needed), retrovir.    Do not wear jewelry, make-up or nail polish.  Do not wear lotions, powders, or perfumes. Please wear deodorant and brush your teeth.  Do not shave 48 hours prior to surgery.  Men may shave face and neck.  Do not bring valuables to the hospital.  [facility] is not responsible for any belongings or valuables.  Contacts, dentures or bridgework may not be worn into surgery.  Leave your suitcase in the car.  After surgery it may be brought to your room.  For patients admitted to the hospital, discharge time will be determined by your treatment team.  Patients discharged the day of surgery will not be allowed to drive home.   Name and phone number of your driver:   family Special instructions:  DO NOT smoke the morning of your procedure.  Please read over the following fact sheets that you were given. Anesthesia Post-op Instructions and Care and Recovery After Surgery       Colonoscopy, Adult, Care After This sheet gives you information about how to care for yourself after your procedure. Your health care provider may also give you more specific instructions. If you have problems or questions, contact your health care provider. What can I expect after the procedure? After the procedure, it is common to have:  A small amount of blood in your stool for 24 hours after the procedure.  Some gas.  Mild cramping or bloating of your abdomen. Follow these instructions at home: Eating and  drinking   Drink enough fluid to keep your urine pale yellow.  Follow instructions from your health care provider about eating or drinking restrictions.  Resume your normal diet as instructed by your health care provider. Avoid heavy or fried foods that are hard to digest. Activity  Rest as told by your health care provider.  Avoid sitting for a long time without moving. Get up to take short walks every 1-2 hours. This is important to improve blood flow and breathing. Ask for help if you feel weak or unsteady.  Return to your normal activities as told by your health care provider. Ask your health care provider what activities are safe for you. Managing cramping and bloating   Try walking around when you have cramps or feel bloated.  Apply heat to your abdomen as told by your health care provider. Use the heat source that your health care provider recommends, such as a moist heat pack or a heating pad. ? Place a towel between your skin and the heat source. ? Leave the heat on for 20-30 minutes. ? Remove the heat if your skin turns bright red. This is especially important if you are unable to feel pain, heat, or cold. You may have a greater risk of getting burned. General instructions  For the first 24 hours after  the procedure: ? Do not drive or use machinery. ? Do not sign important documents. ? Do not drink alcohol. ? Do your regular daily activities at a slower pace than normal. ? Eat soft foods that are easy to digest.  Take over-the-counter and prescription medicines only as told by your health care provider.  Keep all follow-up visits as told by your health care provider. This is important. Contact a health care provider if:  You have blood in your stool 2-3 days after the procedure. Get help right away if you have:  More than a small spotting of blood in your stool.  Large blood clots in your stool.  Swelling of your abdomen.  Nausea or vomiting.  A  fever.  Increasing pain in your abdomen that is not relieved with medicine. Summary  After the procedure, it is common to have a small amount of blood in your stool. You may also have mild cramping and bloating of your abdomen.  For the first 24 hours after the procedure, do not drive or use machinery, sign important documents, or drink alcohol.  Get help right away if you have a lot of blood in your stool, nausea or vomiting, a fever, or increased pain in your abdomen. This information is not intended to replace advice given to you by your health care provider. Make sure you discuss any questions you have with your health care provider. Document Revised: 09/12/2018 Document Reviewed: 09/12/2018 Elsevier Patient Education  [place] After These instructions provide you with information about caring for yourself after your procedure. Your health care provider may also give you more specific instructions. Your treatment has been planned according to current medical practices, but problems sometimes occur. Call your health care provider if you have any problems or questions after your procedure. What can I expect after the procedure? After your procedure, you may:  Feel sleepy for several hours.  Feel clumsy and have poor balance for several hours.  Feel forgetful about what happened after the procedure.  Have poor judgment for several hours.  Feel nauseous or vomit.  Have a sore throat if you had a breathing tube during the procedure. Follow these instructions at home: For at least 24 hours after the procedure:      Have a responsible adult stay with you. It is important to have someone help care for you until you are awake and alert.  Rest as needed.  Do not: ? Participate in activities in which you could fall or become injured. ? Drive. ? Use heavy machinery. ? Drink alcohol. ? Take sleeping pills or medicines that cause  drowsiness. ? Make important decisions or sign legal documents. ? Take care of children on your own. Eating and drinking  Follow the diet that is recommended by your health care provider.  If you vomit, drink water, juice, or soup when you can drink without vomiting.  Make sure you have little or no nausea before eating solid foods. General instructions  Take over-the-counter and prescription medicines only as told by your health care provider.  If you have sleep apnea, surgery and certain medicines can increase your risk for breathing problems. Follow instructions from your health care provider about wearing your sleep device: ? Anytime you are sleeping, including during daytime naps. ? While taking prescription pain medicines, sleeping medicines, or medicines that make you drowsy.  If you smoke, do not smoke without supervision.  Keep all follow-up visits as told by  your health care provider. This is important. Contact a health care provider if:  You keep feeling nauseous or you keep vomiting.  You feel light-headed.  You develop a rash.  You have a fever. Get help right away if:  You have trouble breathing. Summary  For several hours after your procedure, you may feel sleepy and have poor judgment.  Have a responsible adult stay with you for at least 24 hours or until you are awake and alert. This information is not intended to replace advice given to you by your health care provider. Make sure you discuss any questions you have with your health care provider. Document Revised: 05/17/2017 Document Reviewed: 06/09/2015 Elsevier Patient Education  [place].

## 2019-12-30 DIAGNOSIS — D472 Monoclonal gammopathy: Secondary | ICD-10-CM | POA: Diagnosis not present

## 2019-12-30 DIAGNOSIS — Z23 Encounter for immunization: Secondary | ICD-10-CM | POA: Diagnosis not present

## 2019-12-30 DIAGNOSIS — N186 End stage renal disease: Secondary | ICD-10-CM | POA: Diagnosis not present

## 2019-12-30 DIAGNOSIS — D509 Iron deficiency anemia, unspecified: Secondary | ICD-10-CM | POA: Diagnosis not present

## 2019-12-30 DIAGNOSIS — R110 Nausea: Secondary | ICD-10-CM | POA: Diagnosis not present

## 2019-12-30 DIAGNOSIS — N2581 Secondary hyperparathyroidism of renal origin: Secondary | ICD-10-CM | POA: Diagnosis not present

## 2019-12-31 DIAGNOSIS — N186 End stage renal disease: Secondary | ICD-10-CM | POA: Diagnosis not present

## 2019-12-31 DIAGNOSIS — Z992 Dependence on renal dialysis: Secondary | ICD-10-CM | POA: Diagnosis not present

## 2020-01-01 ENCOUNTER — Other Ambulatory Visit: Payer: Self-pay

## 2020-01-01 ENCOUNTER — Other Ambulatory Visit (HOSPITAL_COMMUNITY): Payer: Medicare Other

## 2020-01-01 ENCOUNTER — Encounter (HOSPITAL_COMMUNITY): Payer: Self-pay

## 2020-01-01 ENCOUNTER — Encounter (HOSPITAL_COMMUNITY)
Admission: RE | Admit: 2020-01-01 | Discharge: 2020-01-01 | Disposition: A | Discharge status: Home / Self Care | Payer: Medicare Other | Source: Ambulatory Visit | Attending: Internal Medicine | Admitting: Internal Medicine

## 2020-01-01 DIAGNOSIS — R278 Other lack of coordination: Secondary | ICD-10-CM | POA: Diagnosis not present

## 2020-01-01 DIAGNOSIS — G5603 Carpal tunnel syndrome, bilateral upper limbs: Secondary | ICD-10-CM | POA: Diagnosis not present

## 2020-01-01 DIAGNOSIS — G5601 Carpal tunnel syndrome, right upper limb: Secondary | ICD-10-CM | POA: Diagnosis not present

## 2020-01-02 ENCOUNTER — Ambulatory Visit (HOSPITAL_COMMUNITY): Payer: Medicare Other | Admitting: Certified Registered"

## 2020-01-02 ENCOUNTER — Encounter (HOSPITAL_COMMUNITY): Payer: Self-pay

## 2020-01-02 ENCOUNTER — Encounter (HOSPITAL_COMMUNITY)
Admission: RE | Disposition: A | Discharge status: Nursing Home (Includes ICF) | Payer: Self-pay | Source: Home / Self Care | Attending: Internal Medicine

## 2020-01-02 ENCOUNTER — Telehealth: Payer: Self-pay | Admitting: Nurse Practitioner

## 2020-01-02 ENCOUNTER — Ambulatory Visit (HOSPITAL_COMMUNITY)
Admission: RE | Admit: 2020-01-02 | Discharge: 2020-01-02 | Disposition: A | Discharge status: Nursing Home (Includes ICF) | Payer: Medicare Other | Attending: Internal Medicine | Admitting: Internal Medicine

## 2020-01-02 DIAGNOSIS — R933 Abnormal findings on diagnostic imaging of other parts of digestive tract: Secondary | ICD-10-CM | POA: Insufficient documentation

## 2020-01-02 DIAGNOSIS — Z20822 Contact with and (suspected) exposure to covid-19: Secondary | ICD-10-CM | POA: Diagnosis not present

## 2020-01-02 DIAGNOSIS — I110 Hypertensive heart disease with heart failure: Secondary | ICD-10-CM | POA: Diagnosis not present

## 2020-01-02 DIAGNOSIS — K529 Noninfective gastroenteritis and colitis, unspecified: Secondary | ICD-10-CM | POA: Diagnosis not present

## 2020-01-02 DIAGNOSIS — K648 Other hemorrhoids: Secondary | ICD-10-CM | POA: Diagnosis not present

## 2020-01-02 DIAGNOSIS — I509 Heart failure, unspecified: Secondary | ICD-10-CM | POA: Diagnosis not present

## 2020-01-02 DIAGNOSIS — K5730 Diverticulosis of large intestine without perforation or abscess without bleeding: Secondary | ICD-10-CM | POA: Insufficient documentation

## 2020-01-02 DIAGNOSIS — K8689 Other specified diseases of pancreas: Secondary | ICD-10-CM

## 2020-01-02 DIAGNOSIS — K219 Gastro-esophageal reflux disease without esophagitis: Secondary | ICD-10-CM | POA: Diagnosis not present

## 2020-01-02 DIAGNOSIS — K838 Other specified diseases of biliary tract: Secondary | ICD-10-CM

## 2020-01-02 HISTORY — PX: COLONOSCOPY WITH PROPOFOL: SHX5780

## 2020-01-02 HISTORY — PX: BIOPSY: SHX5522

## 2020-01-02 LAB — POCT I-STAT, CHEM 8
BUN: 37 mg/dL — ABNORMAL HIGH (ref 8–23)
Calcium, Ion: 1.18 mmol/L (ref 1.15–1.40)
Chloride: 99 mmol/L (ref 98–111)
Creatinine, Ser: 9.6 mg/dL — ABNORMAL HIGH (ref 0.44–1.00)
Glucose, Bld: 61 mg/dL — ABNORMAL LOW (ref 70–99)
HCT: 37 % (ref 36.0–46.0)
Hemoglobin: 12.6 g/dL (ref 12.0–15.0)
Potassium: 4.9 mmol/L (ref 3.5–5.1)
Sodium: 137 mmol/L (ref 135–145)
TCO2: 26 mmol/L (ref 22–32)

## 2020-01-02 LAB — RESP PANEL BY RT PCR (RSV, FLU A&B, COVID)
Influenza A by PCR: NEGATIVE
Influenza B by PCR: NEGATIVE
Respiratory Syncytial Virus by PCR: NEGATIVE
SARS Coronavirus 2 by RT PCR: NEGATIVE

## 2020-01-02 SURGERY — COLONOSCOPY WITH PROPOFOL
Anesthesia: General

## 2020-01-02 MED ORDER — CHLORHEXIDINE GLUCONATE CLOTH 2 % EX PADS: 6.0000 | MEDICATED_PAD | Freq: Once | CUTANEOUS | Status: DC

## 2020-01-02 MED ORDER — PROPOFOL 10 MG/ML IV BOLUS
INTRAVENOUS | Status: DC | PRN
  Administered 2020-01-02: 50 mg via INTRAVENOUS
  Administered 2020-01-02 (×3): 20 mg via INTRAVENOUS
  Administered 2020-01-02: 10 mg via INTRAVENOUS

## 2020-01-02 MED ORDER — STERILE WATER FOR IRRIGATION IR SOLN
Status: DC | PRN
  Administered 2020-01-02: 1.5 mL

## 2020-01-02 MED ORDER — LACTATED RINGERS IV SOLN: INTRAVENOUS | Status: DC

## 2020-01-02 NOTE — Anesthesia Postprocedure Evaluation (Signed)
Anesthesia Post Note  Patient: [name]  Procedure(s) Performed: COLONOSCOPY WITH PROPOFOL (N/A ) BIOPSY  Patient location during evaluation: PACU Anesthesia Type: General Level of consciousness: awake, oriented, awake and alert and patient cooperative Pain management: pain level controlled Vital Signs Assessment: post-procedure vital signs reviewed and stable Respiratory status: spontaneous breathing, respiratory function stable and nonlabored ventilation Cardiovascular status: blood pressure returned to baseline and stable Postop Assessment: no headache and no backache Anesthetic complications: no   No complications documented.   Last Vitals:  Vitals:   01/02/20 1224  BP: (!) 185/76  Pulse: 71  Resp: 12  Temp: 36.7 C  SpO2: 95%    Last Pain:  Vitals:   01/02/20 1241  TempSrc:   PainSc: 0-No pain                 Tacy Learn

## 2020-01-02 NOTE — Discharge Instructions (Signed)
Colonoscopy Discharge Instructions  Read the instructions outlined below and refer to this sheet in the next few weeks. These discharge instructions provide you with general information on caring for yourself after you leave the hospital. Your doctor may also give you specific instructions. While your treatment has been planned according to the most current medical practices available, unavoidable complications occasionally occur.   ACTIVITY  You may resume your regular activity, but move at a slower pace for the next 24 hours.   Take frequent rest periods for the next 24 hours.   Walking will help get rid of the air and reduce the bloated feeling in your belly (abdomen).   No driving for 24 hours (because of the medicine (anesthesia) used during the test).    Do not sign any important legal documents or operate any machinery for 24 hours (because of the anesthesia used during the test).  NUTRITION  Drink plenty of fluids.   You may resume your normal diet as instructed by your doctor.   Begin with a light meal and progress to your normal diet. Heavy or fried foods are harder to digest and may make you feel sick to your stomach (nauseated).   Avoid alcoholic beverages for 24 hours or as instructed.  MEDICATIONS  You may resume your normal medications unless your doctor tells you otherwise.  WHAT YOU CAN EXPECT TODAY  Some feelings of bloating in the abdomen.   Passage of more gas than usual.   Spotting of blood in your stool or on the toilet paper.  IF YOU HAD POLYPS REMOVED DURING THE COLONOSCOPY:  No aspirin products for 7 days or as instructed.   No alcohol for 7 days or as instructed.   Eat a soft diet for the next 24 hours.  FINDING OUT THE RESULTS OF YOUR TEST Not all test results are available during your visit. If your test results are not back during the visit, make an appointment with your caregiver to find out the results. Do not assume everything is normal if  you have not heard from your caregiver or the medical facility. It is important for you to follow up on all of your test results.  SEEK IMMEDIATE MEDICAL ATTENTION IF:  You have more than a spotting of blood in your stool.   Your belly is swollen (abdominal distention).   You are nauseated or vomiting.   You have a temperature over 101.   You have abdominal pain or discomfort that is severe or gets worse throughout the day.   Your colonoscopy was largely unremarkable besides diverticulosis and internal hemorrhoids.  I did not find any evidence of inflammation throughout your entire colon.  I did take biopsies of your colon and we should have these results back by in a week or so.  We need to schedule you for MRI to evaluate your bile ducts which were abnormal on CT imaging.  The radiology department will call you over the next 1 to 2 days to set this up.  Follow-up with [name] in 4 to 6 weeks.  I hope you have a great rest of your week!  [name]. Abbey Chatters, D.O. Gastroenterology and Hepatology [facility]  Hemorrhoids Hemorrhoids are swollen veins in and around the rectum or anus. There are two types of hemorrhoids:  Internal hemorrhoids. These occur in the veins that are just inside the rectum. They may poke through to the outside and become irritated and painful.  External hemorrhoids. These occur  in the veins that are outside the anus and can be felt as a painful swelling or hard lump near the anus. Most hemorrhoids do not cause serious problems, and they can be managed with home treatments such as diet and lifestyle changes. If home treatments do not help the symptoms, procedures can be done to shrink or remove the hemorrhoids. What are the causes? This condition is caused by increased pressure in the anal area. This pressure may result from various things, including:  Constipation.  Straining to have a bowel  movement.  Diarrhea.  Pregnancy.  Obesity.  Sitting for long periods of time.  Heavy lifting or other activity that causes you to strain.  Anal sex.  Riding a bike for a long period of time. What are the signs or symptoms? Symptoms of this condition include:  Pain.  Anal itching or irritation.  Rectal bleeding.  Leakage of stool (feces).  Anal swelling.  One or more lumps around the anus. How is this diagnosed? This condition can often be diagnosed through a visual exam. Other exams or tests may also be done, such as:  An exam that involves feeling the rectal area with a gloved hand (digital rectal exam).  An exam of the anal canal that is done using a small tube (anoscope).  A blood test, if you have lost a significant amount of blood.  A test to look inside the colon using a flexible tube with a camera on the end (sigmoidoscopy or colonoscopy). How is this treated? This condition can usually be treated at home. However, various procedures may be done if dietary changes, lifestyle changes, and other home treatments do not help your symptoms. These procedures can help make the hemorrhoids smaller or remove them completely. Some of these procedures involve surgery, and others do not. Common procedures include:  Rubber band ligation. Rubber bands are placed at the base of the hemorrhoids to cut off their blood supply.  Sclerotherapy. Medicine is injected into the hemorrhoids to shrink them.  Infrared coagulation. A type of light energy is used to get rid of the hemorrhoids.  Hemorrhoidectomy surgery. The hemorrhoids are surgically removed, and the veins that supply them are tied off.  Stapled hemorrhoidopexy surgery. The surgeon staples the base of the hemorrhoid to the rectal wall. Follow these instructions at home: Eating and drinking   Eat foods that have a lot of fiber in them, such as whole grains, beans, nuts, fruits, and vegetables.  Ask your health care  provider about taking products that have added fiber (fiber supplements).  Reduce the amount of fat in your diet. You can do this by eating low-fat dairy products, eating less red meat, and avoiding processed foods.  Drink enough fluid to keep your urine pale yellow. Managing pain and swelling   Take warm sitz baths for 20 minutes, 3-4 times a day to ease pain and discomfort. You may do this in a bathtub or using a portable sitz bath that fits over the toilet.  If directed, apply ice to the affected area. Using ice packs between sitz baths may be helpful. ? Put ice in a plastic bag. ? Place a towel between your skin and the bag. ? Leave the ice on for 20 minutes, 2-3 times a day. General instructions  Take over-the-counter and prescription medicines only as told by your health care provider.  Use medicated creams or suppositories as told.  Get regular exercise. Ask your health care provider how much and what kind  of exercise is best for you. In general, you should do moderate exercise for at least 30 minutes on most days of the week (150 minutes each week). This can include activities such as walking, biking, or yoga.  Go to the bathroom when you have the urge to have a bowel movement. Do not wait.  Avoid straining to have bowel movements.  Keep the anal area dry and clean. Use wet toilet paper or moist towelettes after a bowel movement.  Do not sit on the toilet for long periods of time. This increases blood pooling and pain.  Keep all follow-up visits as told by your health care provider. This is important. Contact a health care provider if you have:  Increasing pain and swelling that are not controlled by treatment or medicine.  Difficulty having a bowel movement, or you are unable to have a bowel movement.  Pain or inflammation outside the area of the hemorrhoids. Get help right away if you have:  Uncontrolled bleeding from your rectum. Summary  Hemorrhoids are swollen  veins in and around the rectum or anus.  Most hemorrhoids can be managed with home treatments such as diet and lifestyle changes.  Taking warm sitz baths can help ease pain and discomfort.  In severe cases, procedures or surgery can be done to shrink or remove the hemorrhoids. This information is not intended to replace advice given to you by your health care provider. Make sure you discuss any questions you have with your health care provider. Document Revised: 07/15/2018 Document Reviewed: 07/08/2017 Elsevier Patient Education  [place].  Diverticulosis  Diverticulosis is a condition that develops when small pouches (diverticula) form in the wall of the large intestine (colon). The colon is where water is absorbed and stool (feces) is formed. The pouches form when the inside layer of the colon pushes through weak spots in the outer layers of the colon. You may have a few pouches or many of them. The pouches usually do not cause problems unless they become inflamed or infected. When this happens, the condition is called diverticulitis. What are the causes? The cause of this condition is not known. What increases the risk? The following factors may make you more likely to develop this condition:  Being older than age 35. Your risk for this condition increases with age. Diverticulosis is rare among people younger than age 32. By age 24, many people have it.  Eating a low-fiber diet.  Having frequent constipation.  Being overweight.  Not getting enough exercise.  Smoking.  Taking over-the-counter pain medicines, like aspirin and ibuprofen.  Having a family history of diverticulosis. What are the signs or symptoms? In most people, there are no symptoms of this condition. If you do have symptoms, they may include:  Bloating.  Cramps in the abdomen.  Constipation or diarrhea.  Pain in the lower left side of the abdomen. How is this diagnosed? Because diverticulosis  usually has no symptoms, it is most often diagnosed during an exam for other colon problems. The condition may be diagnosed by:  Using a flexible scope to examine the colon (colonoscopy).  Taking an X-ray of the colon after dye has been put into the colon (barium enema).  Having a CT scan. How is this treated? You may not need treatment for this condition. Your health care provider may recommend treatment to prevent problems. You may need treatment if you have symptoms or if you previously had diverticulitis. Treatment may include:  Eating a high-fiber  diet.  Taking a fiber supplement.  Taking a live bacteria supplement (probiotic).  Taking medicine to relax your colon. Follow these instructions at home: Medicines  Take over-the-counter and prescription medicines only as told by your health care provider.  If told by your health care provider, take a fiber supplement or probiotic. Constipation prevention Your condition may cause constipation. To prevent or treat constipation, you may need to:  Drink enough fluid to keep your urine pale yellow.  Take over-the-counter or prescription medicines.  Eat foods that are high in fiber, such as beans, whole grains, and fresh fruits and vegetables.  Limit foods that are high in fat and processed sugars, such as fried or sweet foods.  General instructions  Try not to strain when you have a bowel movement.  Keep all follow-up visits as told by your health care provider. This is important. Contact a health care provider if you:  Have pain in your abdomen.  Have bloating.  Have cramps.  Have not had a bowel movement in 3 days. Get help right away if:  Your pain gets worse.  Your bloating becomes very bad.  You have a fever or chills, and your symptoms suddenly get worse.  You vomit.  You have bowel movements that are bloody or black.  You have bleeding from your rectum. Summary  Diverticulosis is a condition that  develops when small pouches (diverticula) form in the wall of the large intestine (colon).  You may have a few pouches or many of them.  This condition is most often diagnosed during an exam for other colon problems.  Treatment may include increasing the fiber in your diet, taking supplements, or taking medicines. This information is not intended to replace advice given to you by your health care provider. Make sure you discuss any questions you have with your health care provider. Document Revised: 09/15/2018 Document Reviewed: 09/15/2018 Elsevier Patient Education  [place].

## 2020-01-02 NOTE — H&P (Signed)
Primary Care Physician:  Hilbert Corrigan, MD Primary Gastroenterologist:  Dr. Abbey Chatters  Pre-Procedure History & Physical: HPI:  [name] is a 69 y.o. female is here fora colonoscopy to be performed for colitis on CT imaging as well as diarrhea.   Patient denies any family history of colorectal cancer.  No melena or hematochezia.  No abdominal pain or unintentional weight loss.  States diarrhea is improved after course of abx.    Past Medical History:  Diagnosis Date  . Anemia   . Anxiety   . Atrial flutter ([place]) 02/22/2015  . C. difficile colitis 10/10/11  . Chronic diarrhea   . Chronic pain   . Depression   . ESRD on hemodialysis [facility])    Dialysis [facility]  . GERD (gastroesophageal reflux disease)   . HIV (human immunodeficiency virus infection) ([place])   . Hypertension   . Insomnia   . Intracranial hemorrhage ([place])   . Muscle weakness (generalized)   . Pulmonary HTN ([place])   . Tachycardia   . TIA (transient ischemic attack)    [facility] do not have this dx  . Traumatic hematoma of right upper arm 02/22/2015    Past Surgical History:  Procedure Laterality Date  . A/V FISTULAGRAM N/A 04/17/2016   Procedure: A/V Fistulagram - Right Upper;  Surgeon: Waynetta Sandy, MD;  Location: [place] [facility];  Service: Cardiovascular;  Laterality: N/A;  . A/V FISTULAGRAM Right 08/28/2019   Procedure: A/V FISTULAGRAM;  Surgeon: Waynetta Sandy, MD;  Location: [place] [facility];  Service: Cardiovascular;  Laterality: Right;  . ABDOMINAL HYSTERECTOMY    . BIOPSY THYROID    . CARPAL TUNNEL RELEASE Right 11/16/2019   Procedure: CARPAL TUNNEL RELEASE;  Surgeon: Leanora Cover, MD;  Location: [place];  Service: Orthopedics;  Laterality: Right;  . Dialysis Shunts     previous one removed from left arm and now present one in right arm  . DIALYSIS/PERMA CATHETER INSERTION Right 04/17/2016   Procedure: dialysis Catheter Insertion central veinous;   Surgeon: Waynetta Sandy, MD;  Location: [place] [facility];  Service: Cardiovascular;  Laterality: Right;  . ESOPHAGOGASTRODUODENOSCOPY  12/15/2010   [place]: erosive reflux esophagitis, MW tear, hiatal hernia (small), gastritis with no h.pylori, possibly nsaid related  . EXCISION OF BREAST BIOPSY Right 05/04/2012   Procedure: EXCISION OF BREAST BIOPSY;  Surgeon: Donato Heinz, MD;  Location: [facility];  Service: General;  Laterality: Right;  Right Excisional Breast Biopsy  . FISTULOGRAM Right 03/26/2014   Procedure: Right Arm Fistulogram with Venoplasty Right Subclavian Vein and Inominate Vein. Debridement Fistula Ulcer;  Surgeon: Conrad Santel, MD;  Location: [place];  Service: Vascular;  Laterality: Right;  . FISTULOGRAM Right 03/29/2017   Procedure: FISTULOGRAM COMPLEX RIGHT ARM with Balloon angioplasty;  Surgeon: Waynetta Sandy, MD;  Location: [place];  Service: Vascular;  Laterality: Right;  . IR GENERIC HISTORICAL  05/19/2016   IR REMOVAL TUN CV CATH W/O FL 05/19/2016 Markus Daft, MD [facility]-INTERV RAD  . PERIPHERAL VASCULAR BALLOON ANGIOPLASTY Right 04/17/2016   Procedure: Peripheral Vascular Balloon Angioplasty;  Surgeon: Waynetta Sandy, MD;  Location: [place] [facility];  Service: Cardiovascular;  Laterality: Right;  Central segment AV Fistula  . PERIPHERAL VASCULAR BALLOON ANGIOPLASTY Right 03/14/2018   Procedure: PERIPHERAL VASCULAR BALLOON ANGIOPLASTY;  Surgeon: Waynetta Sandy, MD;  Location: [place] [facility];  Service: Cardiovascular;  Laterality: Right;  subclavian vein  . PERIPHERAL VASCULAR BALLOON  ANGIOPLASTY Right 08/28/2019   Procedure: PERIPHERAL VASCULAR BALLOON ANGIOPLASTY;  Surgeon: Waynetta Sandy, MD;  Location: [place] [facility];  Service: Cardiovascular;  Laterality: Right;  upper arm  . PERIPHERAL VASCULAR INTERVENTION Right 03/14/2018   Procedure: PERIPHERAL VASCULAR INTERVENTION;  Surgeon: Waynetta Sandy, MD;  Location: [place] [facility];  Service: Cardiovascular;  Laterality: Right;  subclavian vein  . SHUNTOGRAM Right 10/19/2013   Procedure: FISTULOGRAM;  Surgeon: Conrad Donaldson, MD;  Location: [facility] CATH LAB;  Service: Cardiovascular;  Laterality: Right;  . TONSILLECTOMY    . VISCERAL VENOGRAPHY Right 03/14/2018   Procedure: CENTRAL VENOGRAPHY;  Surgeon: Waynetta Sandy, MD;  Location: [place] [facility];  Service: Cardiovascular;  Laterality: Right;  upper ext fistula    Prior to Admission medications   Medication Sig Start Date End Date Taking? Authorizing Provider  Biotin 5000 MCG CAPS Take 5,000 mcg by mouth daily. (0800)   Yes [provider]  calcium acetate, Phos Binder, (PHOSLYRA) 667 MG/5ML SOLN Take 2,001 mg by mouth 3 (three) times daily with meals. (0800, 1200, & 2000)   Yes [provider]  calcium elemental as carbonate (BARIATRIC TUMS ULTRA) 400 MG chewable tablet Chew 1,000 mg by mouth 2 (two) times daily.   Yes [provider]  clopidogrel (PLAVIX) 75 MG tablet Take 1 tablet (75 mg total) by mouth daily. Patient taking differently: Take 75 mg by mouth daily. (0800) 03/29/17  Yes Waynetta Sandy, MD  cyanocobalamin (,VITAMIN B-12,) 1000 MCG/ML injection Inject 1 mL (1,000 mcg total) into the muscle every 28 (twenty-eight) days. 10/25/19  Yes Derek Jack, MD  dolutegravir (TIVICAY) 50 MG tablet Take 50 mg by mouth daily at 4 PM.    Yes [provider]  Emollient (MINERIN) LOTN Apply 1 application topically every 12 (twelve) hours as needed (dry skin). Apply to feet and ankles   Yes [provider]  escitalopram (LEXAPRO) 10 MG tablet Take 10 mg by mouth daily. (0800)   Yes [provider]  famotidine (PEPCID) 20 MG tablet Take 20 mg by mouth daily.   Yes [provider]  fentaNYL (DURAGESIC) 25 MCG/HR Place 1 patch onto the skin every 3 (three) days.   Yes [provider]  hydrocortisone cream  (PREPARATION H) 1 % Apply 1 application topically every 6 (six) hours as needed (hemorrhoids).   Yes [provider]  lamivudine (EPIVIR) 100 MG tablet Take 50 mg by mouth daily. (0800)   Yes [provider]  Lidocaine HCl (ASPERCREME LIDOCAINE) 4 % CREA Apply 1 application topically every 12 (twelve) hours as needed (wrist pain).   Yes [provider]  loperamide (IMODIUM) 2 MG capsule Take 2-4 mg by mouth as needed for diarrhea or loose stools.    Yes [provider]  methadone (DOLOPHINE) 5 MG tablet Take 2.5 mg by mouth daily at 8 pm.  08/02/19  Yes [provider]  Multiple Vitamin (MULTIVITAMIN WITH MINERALS) TABS tablet Take 1 tablet by mouth at bedtime. (2000)   Yes [provider]  ondansetron (ZOFRAN-ODT) 4 MG disintegrating tablet Take 4 mg by mouth every 8 (eight) hours as needed for nausea or vomiting.    Yes [provider]  Oxycodone HCl 10 MG TABS Take 1 tablet (10 mg total) by mouth every 12 (twelve) hours as needed (severe pain). 07/22/19  Yes Tat, Shanon Brow, MD  polyethylene glycol-electrolytes (NULYTELY) 420 g solution As directed 12/04/19  Yes Eloise Harman, DO  promethazine (PHENERGAN) 12.5 MG tablet Take 12.5 mg by mouth See admin instructions. Take 12.5 mg by mouth in the morning every Tuesday, Thursday and Saturday for nausea/vomiting and every 6 hours as needed for n/v   Yes [provider]  saccharomyces boulardii (FLORASTOR) 250 MG capsule Take 250 mg by mouth 2 (two) times daily.   Yes [provider]  vitamin C (ASCORBIC ACID) 500 MG tablet Take 500 mg by mouth 2 (two) times daily. (0900 & 2100)   Yes [provider]  zidovudine (RETROVIR) 100 MG capsule Take 100 mg by mouth 3 (three) times daily. (0900, 1300, & 2100)   Yes [provider]  Zinc 220 (50 Zn) MG CAPS Take 220 mg by mouth daily. (0800)   Yes [provider]  nitroGLYCERIN (NITROSTAT) 0.4 MG SL tablet Place  0.4 mg under the tongue every 5 (five) minutes x 3 doses as needed for chest pain.     [provider]    Allergies as of 12/04/2019 - Review Complete 11/28/2019  Allergen Reaction Noted  . Lactose intolerance (gi) Other (See Comments) 03/29/2017  . Latex Rash and Itching 03/09/2011  . Penicillins Rash and Swelling 04/02/2011    Family History  Problem Relation Age of Onset  . Anesthesia problems Neg Hx   . Hypotension Neg Hx   . Malignant hyperthermia Neg Hx   . Pseudochol deficiency Neg Hx   . Colon cancer Neg Hx        Unsure of parents who both died when she was a baby    Social History   Socioeconomic History  . Marital status: Widowed    Spouse name: Not on file  . Number of children: 1  . Years of education: Not on file  . Highest education level: Not on file  Occupational History  . Not on file  Tobacco Use  . Smoking status: Former Smoker    Types: Cigarettes    Quit date: 03/12/1983    Years since quitting: 36.8  . Smokeless tobacco: Never Used  Vaping Use  . Vaping Use: Never used  Substance and Sexual Activity  . Alcohol use: No    Alcohol/week: 0.0 standard drinks  . Drug use: No  . Sexual activity: Not Currently    Birth control/protection: Surgical    Comment: Hysterectomy  Other Topics Concern  . Not on file  Social History Narrative  . Not on file   Social Determinants of Health   Financial Resource Strain:   . Difficulty of Paying Living Expenses: Not on file  Food Insecurity:   . Worried About Charity fundraiser in the Last Year: Not on file  . Ran Out of Food in the Last Year: Not on file  Transportation Needs:   . Lack of Transportation (Medical): Not on file  . Lack of Transportation (Non-Medical): Not on file  Physical Activity:   . Days of Exercise per Week: Not on file  . Minutes of Exercise per Session: Not on file  Stress:   . Feeling of Stress : Not on file  Social Connections:   . Frequency of Communication with  Friends and Family: Not on file  . Frequency of Social Gatherings with Friends and Family: Not on file  . Attends Religious Services: Not on file  . Active Member of Clubs or Organizations: Not on file  . Attends Archivist Meetings: Not on file  . Marital Status: Not on file  Intimate Partner  Violence:   . Fear of Current or Ex-Partner: Not on file  . Emotionally Abused: Not on file  . Physically Abused: Not on file  . Sexually Abused: Not on file    Review of Systems: See HPI, otherwise negative ROS  Impression/Plan: [name] is here for a colonoscopy to be performed for colitis on CT imaging as well as diarrhea.   The risks of the procedure including infection, bleed, or perforation as well as benefits, limitations, alternatives and imponderables have been reviewed with the patient. Questions have been answered. All parties agreeable.

## 2020-01-02 NOTE — Anesthesia Preprocedure Evaluation (Signed)
Anesthesia Evaluation  Patient identified by MRN, date of birth, ID band Patient awake    Reviewed: Allergy & Precautions, H&P , NPO status , Patient's Chart, lab work & pertinent test results, reviewed documented beta blocker date and time   Airway Mallampati: II  TM Distance: >3 FB Neck ROM: full    Dental no notable dental hx. (+) Edentulous Upper, Edentulous Lower   Pulmonary pneumonia, resolved, former smoker,    Pulmonary exam normal breath sounds clear to auscultation       Cardiovascular Exercise Tolerance: Good hypertension, +CHF   Rhythm:regular Rate:Normal     Neuro/Psych  Headaches, PSYCHIATRIC DISORDERS Anxiety Depression TIA   GI/Hepatic Neg liver ROS, PUD, GERD  Medicated,  Endo/Other  negative endocrine ROS  Renal/GU negative Renal ROS  negative genitourinary   Musculoskeletal   Abdominal   Peds  Hematology  (+) Blood dyscrasia, anemia ,   Anesthesia Other Findings   Reproductive/Obstetrics negative OB ROS                             Anesthesia Physical Anesthesia Plan  ASA: III  Anesthesia Plan: General   Post-op Pain Management:    Induction:   PONV Risk Score and Plan: Propofol infusion  Airway Management Planned:   Additional Equipment:   Intra-op Plan:   Post-operative Plan:   Informed Consent: I have reviewed the patients History and Physical, chart, labs and discussed the procedure including the risks, benefits and alternatives for the proposed anesthesia with the patient or authorized representative who has indicated his/her understanding and acceptance.     Dental Advisory Given  Plan Discussed with: CRNA  Anesthesia Plan Comments:         Anesthesia Quick Evaluation

## 2020-01-02 NOTE — Transfer of Care (Signed)
Immediate Anesthesia Transfer of Care Note  Patient: [name]  Procedure(s) Performed: COLONOSCOPY WITH PROPOFOL (N/A ) BIOPSY  Patient Location: PACU  Anesthesia Type:General  Level of Consciousness: awake, alert , oriented and patient cooperative  Airway & Oxygen Therapy: Patient Spontanous Breathing  Post-op Assessment: Report given to RN, Post -op Vital signs reviewed and stable and Patient moving all extremities  Post vital signs: Reviewed and stable  Last Vitals:  Vitals Value Taken Time  BP    Temp    Pulse    Resp    SpO2      Last Pain:  Vitals:   01/02/20 1241  TempSrc:   PainSc: 0-No pain         Complications: No complications documented.

## 2020-01-02 NOTE — Op Note (Signed)
[facility] Patient Name: [name] Procedure Date: 01/02/2020 12:23 PM MRN: [id number] Date of Birth: 1951-03-01 Attending MD: [name]. Abbey Chatters DO CSN: [id number] Age: 69 Admit Type: Outpatient Procedure:                Colonoscopy Indications:              Chronic diarrhea, Abnormal CT of the GI tract Providers:                [name]. Abbey Chatters, DO, Tacy Learn,                            Technician Referring MD:              Medicines:                See the Anesthesia note for documentation of the                            administered medications Complications:            No immediate complications. Estimated Blood Loss:     Estimated blood loss was minimal. Procedure:                Pre-Anesthesia Assessment:                           - The anesthesia plan was to use monitored                            anesthesia care (MAC).                           After obtaining informed consent, the colonoscope                            was passed under direct vision. Throughout the                            procedure, the patient's blood pressure, pulse, and                            oxygen saturations were monitored continuously. The                            [id number]-HQ190L ([id number]) scope was introduced through                            the anus and advanced to the the cecum, identified                            by appendiceal orifice and ileocecal valve. The                            colonoscopy was performed without difficulty. The                            patient tolerated the procedure  well. The quality                            of the bowel preparation was evaluated using the                            BBPS [facility] Bowel Preparation Scale) with scores                            of: Right Colon = 2 (minor amount of residual                            staining, small fragments of stool and/or opaque                            liquid, but mucosa seen  well), Transverse Colon = 2                            (minor amount of residual staining, small fragments                            of stool and/or opaque liquid, but mucosa seen                            well) and Left Colon = 2 (minor amount of residual                            staining, small fragments of stool and/or opaque                            liquid, but mucosa seen well). The total BBPS score                            equals 6. The quality of the bowel preparation was                            fair. Scope In: 12:47:32 PM Scope Out: 1:00:16 PM Scope Withdrawal Time: 0 hours 7 minutes 19 seconds  Total Procedure Duration: 0 hours 12 minutes 44 seconds  Findings:      The perianal and digital rectal examinations were normal.      Non-bleeding internal hemorrhoids were found during endoscopy.      Multiple small and large-mouthed diverticula were found in the sigmoid       colon, descending colon and transverse colon.      Biopsies were taken with a cold forceps in the entire colon for       histology.      There is no endoscopic evidence of inflammation in the entire colon. Impression:               - Preparation of the colon was fair.                           - Non-bleeding internal hemorrhoids.                           -  Diverticulosis in the sigmoid colon, in the                            descending colon and in the transverse colon.                           - Biopsies were taken with a cold forceps for                            histology in the entire colon. Moderate Sedation:      Per Anesthesia Care Recommendation:           - Patient has a contact number available for                            emergencies. The signs and symptoms of potential                            delayed complications were discussed with the                            patient. Return to normal activities tomorrow.                            Written discharge instructions were  provided to the                            patient.                           - Resume previous diet.                           - Continue present medications.                           - Await pathology results.                           - Repeat colonoscopy in 5 years for surveillance                            and borderline colon prep.                           - Follow up with GI in 4-6 weeks after MRI/MRCP. Procedure Code(s):        --- Professional ---                           [phone], Colonoscopy, flexible; with biopsy, single                            or multiple Diagnosis Code(s):        --- Professional ---  K64.8, Other hemorrhoids                           K52.9, Noninfective gastroenteritis and colitis,                            unspecified                           K57.30, Diverticulosis of large intestine without                            perforation or abscess without bleeding                           R93.3, Abnormal findings on diagnostic imaging of                            other parts of digestive tract CPT copyright 2019 American Medical Association. All rights reserved. The codes documented in this report are preliminary and upon coder review may  be revised to meet current compliance requirements. [name]. Abbey Chatters, DO [place] Abbey Chatters, DO 01/02/2020 1:13:53 PM This report has been signed electronically. Number of Addenda: 0

## 2020-01-02 NOTE — Telephone Encounter (Signed)
Patient in Endo today. Previous CT recommended MRI/MRCP. Patient is ESRD on HD and per MRI tech, for simple biliary tree evaluation, recommended MRi/MCRP WO contrast. Will call if any issues.  Please call patient tomorrow or within the next couple days to schedule.

## 2020-01-03 DIAGNOSIS — D472 Monoclonal gammopathy: Secondary | ICD-10-CM | POA: Diagnosis not present

## 2020-01-03 DIAGNOSIS — D509 Iron deficiency anemia, unspecified: Secondary | ICD-10-CM | POA: Diagnosis not present

## 2020-01-03 DIAGNOSIS — N2581 Secondary hyperparathyroidism of renal origin: Secondary | ICD-10-CM | POA: Diagnosis not present

## 2020-01-03 DIAGNOSIS — N186 End stage renal disease: Secondary | ICD-10-CM | POA: Diagnosis not present

## 2020-01-03 DIAGNOSIS — Z992 Dependence on renal dialysis: Secondary | ICD-10-CM | POA: Diagnosis not present

## 2020-01-03 DIAGNOSIS — D631 Anemia in chronic kidney disease: Secondary | ICD-10-CM | POA: Diagnosis not present

## 2020-01-03 LAB — SURGICAL PATHOLOGY

## 2020-01-03 NOTE — Telephone Encounter (Signed)
MRI/MRCP scheduled for 01/11/20 at 10:00am, arrive at 9:30am. NPO 4 hours prior to test. [facility] nursing facility. Call was transferred to transporter, LMOVM to inform transporter of appt and details. Appt letter also mailed.

## 2020-01-04 DIAGNOSIS — R278 Other lack of coordination: Secondary | ICD-10-CM | POA: Diagnosis not present

## 2020-01-04 DIAGNOSIS — N2581 Secondary hyperparathyroidism of renal origin: Secondary | ICD-10-CM | POA: Diagnosis not present

## 2020-01-04 DIAGNOSIS — D472 Monoclonal gammopathy: Secondary | ICD-10-CM | POA: Diagnosis not present

## 2020-01-04 DIAGNOSIS — Z992 Dependence on renal dialysis: Secondary | ICD-10-CM | POA: Diagnosis not present

## 2020-01-04 DIAGNOSIS — G5601 Carpal tunnel syndrome, right upper limb: Secondary | ICD-10-CM | POA: Diagnosis not present

## 2020-01-04 DIAGNOSIS — D509 Iron deficiency anemia, unspecified: Secondary | ICD-10-CM | POA: Diagnosis not present

## 2020-01-04 DIAGNOSIS — N186 End stage renal disease: Secondary | ICD-10-CM | POA: Diagnosis not present

## 2020-01-04 DIAGNOSIS — D631 Anemia in chronic kidney disease: Secondary | ICD-10-CM | POA: Diagnosis not present

## 2020-01-04 DIAGNOSIS — G5603 Carpal tunnel syndrome, bilateral upper limbs: Secondary | ICD-10-CM | POA: Diagnosis not present

## 2020-01-05 DIAGNOSIS — R278 Other lack of coordination: Secondary | ICD-10-CM | POA: Diagnosis not present

## 2020-01-05 DIAGNOSIS — G5603 Carpal tunnel syndrome, bilateral upper limbs: Secondary | ICD-10-CM | POA: Diagnosis not present

## 2020-01-05 DIAGNOSIS — G5601 Carpal tunnel syndrome, right upper limb: Secondary | ICD-10-CM | POA: Diagnosis not present

## 2020-01-06 DIAGNOSIS — Z992 Dependence on renal dialysis: Secondary | ICD-10-CM | POA: Diagnosis not present

## 2020-01-06 DIAGNOSIS — D472 Monoclonal gammopathy: Secondary | ICD-10-CM | POA: Diagnosis not present

## 2020-01-06 DIAGNOSIS — N186 End stage renal disease: Secondary | ICD-10-CM | POA: Diagnosis not present

## 2020-01-06 DIAGNOSIS — N2581 Secondary hyperparathyroidism of renal origin: Secondary | ICD-10-CM | POA: Diagnosis not present

## 2020-01-06 DIAGNOSIS — D509 Iron deficiency anemia, unspecified: Secondary | ICD-10-CM | POA: Diagnosis not present

## 2020-01-06 DIAGNOSIS — D631 Anemia in chronic kidney disease: Secondary | ICD-10-CM | POA: Diagnosis not present

## 2020-01-08 ENCOUNTER — Encounter (HOSPITAL_COMMUNITY): Payer: Self-pay | Admitting: Internal Medicine

## 2020-01-08 DIAGNOSIS — G5601 Carpal tunnel syndrome, right upper limb: Secondary | ICD-10-CM | POA: Diagnosis not present

## 2020-01-08 DIAGNOSIS — G5603 Carpal tunnel syndrome, bilateral upper limbs: Secondary | ICD-10-CM | POA: Diagnosis not present

## 2020-01-08 DIAGNOSIS — R278 Other lack of coordination: Secondary | ICD-10-CM | POA: Diagnosis not present

## 2020-01-09 DIAGNOSIS — R52 Pain, unspecified: Secondary | ICD-10-CM | POA: Diagnosis not present

## 2020-01-09 DIAGNOSIS — G8929 Other chronic pain: Secondary | ICD-10-CM | POA: Diagnosis not present

## 2020-01-09 DIAGNOSIS — Z992 Dependence on renal dialysis: Secondary | ICD-10-CM | POA: Diagnosis not present

## 2020-01-09 DIAGNOSIS — D472 Monoclonal gammopathy: Secondary | ICD-10-CM | POA: Diagnosis not present

## 2020-01-09 DIAGNOSIS — N186 End stage renal disease: Secondary | ICD-10-CM | POA: Diagnosis not present

## 2020-01-09 DIAGNOSIS — D509 Iron deficiency anemia, unspecified: Secondary | ICD-10-CM | POA: Diagnosis not present

## 2020-01-09 DIAGNOSIS — R635 Abnormal weight gain: Secondary | ICD-10-CM | POA: Diagnosis not present

## 2020-01-09 DIAGNOSIS — Z79899 Other long term (current) drug therapy: Secondary | ICD-10-CM | POA: Diagnosis not present

## 2020-01-09 DIAGNOSIS — D631 Anemia in chronic kidney disease: Secondary | ICD-10-CM | POA: Diagnosis not present

## 2020-01-09 DIAGNOSIS — N2581 Secondary hyperparathyroidism of renal origin: Secondary | ICD-10-CM | POA: Diagnosis not present

## 2020-01-11 ENCOUNTER — Ambulatory Visit (HOSPITAL_COMMUNITY): Payer: Medicare Other

## 2020-01-11 DIAGNOSIS — N2581 Secondary hyperparathyroidism of renal origin: Secondary | ICD-10-CM | POA: Diagnosis not present

## 2020-01-11 DIAGNOSIS — D472 Monoclonal gammopathy: Secondary | ICD-10-CM | POA: Diagnosis not present

## 2020-01-11 DIAGNOSIS — D631 Anemia in chronic kidney disease: Secondary | ICD-10-CM | POA: Diagnosis not present

## 2020-01-11 DIAGNOSIS — D509 Iron deficiency anemia, unspecified: Secondary | ICD-10-CM | POA: Diagnosis not present

## 2020-01-11 DIAGNOSIS — G5601 Carpal tunnel syndrome, right upper limb: Secondary | ICD-10-CM | POA: Diagnosis not present

## 2020-01-11 DIAGNOSIS — G5603 Carpal tunnel syndrome, bilateral upper limbs: Secondary | ICD-10-CM | POA: Diagnosis not present

## 2020-01-11 DIAGNOSIS — R278 Other lack of coordination: Secondary | ICD-10-CM | POA: Diagnosis not present

## 2020-01-11 DIAGNOSIS — N186 End stage renal disease: Secondary | ICD-10-CM | POA: Diagnosis not present

## 2020-01-11 DIAGNOSIS — Z992 Dependence on renal dialysis: Secondary | ICD-10-CM | POA: Diagnosis not present

## 2020-01-12 DIAGNOSIS — R278 Other lack of coordination: Secondary | ICD-10-CM | POA: Diagnosis not present

## 2020-01-12 DIAGNOSIS — G5601 Carpal tunnel syndrome, right upper limb: Secondary | ICD-10-CM | POA: Diagnosis not present

## 2020-01-12 DIAGNOSIS — G5603 Carpal tunnel syndrome, bilateral upper limbs: Secondary | ICD-10-CM | POA: Diagnosis not present

## 2020-01-13 DIAGNOSIS — N2581 Secondary hyperparathyroidism of renal origin: Secondary | ICD-10-CM | POA: Diagnosis not present

## 2020-01-13 DIAGNOSIS — D509 Iron deficiency anemia, unspecified: Secondary | ICD-10-CM | POA: Diagnosis not present

## 2020-01-13 DIAGNOSIS — N186 End stage renal disease: Secondary | ICD-10-CM | POA: Diagnosis not present

## 2020-01-13 DIAGNOSIS — D472 Monoclonal gammopathy: Secondary | ICD-10-CM | POA: Diagnosis not present

## 2020-01-13 DIAGNOSIS — D631 Anemia in chronic kidney disease: Secondary | ICD-10-CM | POA: Diagnosis not present

## 2020-01-13 DIAGNOSIS — Z992 Dependence on renal dialysis: Secondary | ICD-10-CM | POA: Diagnosis not present

## 2020-01-15 DIAGNOSIS — G5603 Carpal tunnel syndrome, bilateral upper limbs: Secondary | ICD-10-CM | POA: Diagnosis not present

## 2020-01-15 DIAGNOSIS — R278 Other lack of coordination: Secondary | ICD-10-CM | POA: Diagnosis not present

## 2020-01-15 DIAGNOSIS — G5601 Carpal tunnel syndrome, right upper limb: Secondary | ICD-10-CM | POA: Diagnosis not present

## 2020-01-16 DIAGNOSIS — G5603 Carpal tunnel syndrome, bilateral upper limbs: Secondary | ICD-10-CM | POA: Diagnosis not present

## 2020-01-16 DIAGNOSIS — D509 Iron deficiency anemia, unspecified: Secondary | ICD-10-CM | POA: Diagnosis not present

## 2020-01-16 DIAGNOSIS — G5601 Carpal tunnel syndrome, right upper limb: Secondary | ICD-10-CM | POA: Diagnosis not present

## 2020-01-16 DIAGNOSIS — D472 Monoclonal gammopathy: Secondary | ICD-10-CM | POA: Diagnosis not present

## 2020-01-16 DIAGNOSIS — N186 End stage renal disease: Secondary | ICD-10-CM | POA: Diagnosis not present

## 2020-01-16 DIAGNOSIS — Z992 Dependence on renal dialysis: Secondary | ICD-10-CM | POA: Diagnosis not present

## 2020-01-16 DIAGNOSIS — D631 Anemia in chronic kidney disease: Secondary | ICD-10-CM | POA: Diagnosis not present

## 2020-01-16 DIAGNOSIS — R278 Other lack of coordination: Secondary | ICD-10-CM | POA: Diagnosis not present

## 2020-01-16 DIAGNOSIS — N2581 Secondary hyperparathyroidism of renal origin: Secondary | ICD-10-CM | POA: Diagnosis not present

## 2020-01-17 DIAGNOSIS — G5601 Carpal tunnel syndrome, right upper limb: Secondary | ICD-10-CM | POA: Diagnosis not present

## 2020-01-17 DIAGNOSIS — G5603 Carpal tunnel syndrome, bilateral upper limbs: Secondary | ICD-10-CM | POA: Diagnosis not present

## 2020-01-17 DIAGNOSIS — R278 Other lack of coordination: Secondary | ICD-10-CM | POA: Diagnosis not present

## 2020-01-18 DIAGNOSIS — Z992 Dependence on renal dialysis: Secondary | ICD-10-CM | POA: Diagnosis not present

## 2020-01-18 DIAGNOSIS — G5601 Carpal tunnel syndrome, right upper limb: Secondary | ICD-10-CM | POA: Diagnosis not present

## 2020-01-18 DIAGNOSIS — D509 Iron deficiency anemia, unspecified: Secondary | ICD-10-CM | POA: Diagnosis not present

## 2020-01-18 DIAGNOSIS — N186 End stage renal disease: Secondary | ICD-10-CM | POA: Diagnosis not present

## 2020-01-18 DIAGNOSIS — G5603 Carpal tunnel syndrome, bilateral upper limbs: Secondary | ICD-10-CM | POA: Diagnosis not present

## 2020-01-18 DIAGNOSIS — D472 Monoclonal gammopathy: Secondary | ICD-10-CM | POA: Diagnosis not present

## 2020-01-18 DIAGNOSIS — D631 Anemia in chronic kidney disease: Secondary | ICD-10-CM | POA: Diagnosis not present

## 2020-01-18 DIAGNOSIS — N2581 Secondary hyperparathyroidism of renal origin: Secondary | ICD-10-CM | POA: Diagnosis not present

## 2020-01-18 DIAGNOSIS — R278 Other lack of coordination: Secondary | ICD-10-CM | POA: Diagnosis not present

## 2020-01-19 DIAGNOSIS — R278 Other lack of coordination: Secondary | ICD-10-CM | POA: Diagnosis not present

## 2020-01-19 DIAGNOSIS — G5601 Carpal tunnel syndrome, right upper limb: Secondary | ICD-10-CM | POA: Diagnosis not present

## 2020-01-19 DIAGNOSIS — G5603 Carpal tunnel syndrome, bilateral upper limbs: Secondary | ICD-10-CM | POA: Diagnosis not present

## 2020-01-20 DIAGNOSIS — D509 Iron deficiency anemia, unspecified: Secondary | ICD-10-CM | POA: Diagnosis not present

## 2020-01-20 DIAGNOSIS — N186 End stage renal disease: Secondary | ICD-10-CM | POA: Diagnosis not present

## 2020-01-20 DIAGNOSIS — D472 Monoclonal gammopathy: Secondary | ICD-10-CM | POA: Diagnosis not present

## 2020-01-20 DIAGNOSIS — Z992 Dependence on renal dialysis: Secondary | ICD-10-CM | POA: Diagnosis not present

## 2020-01-20 DIAGNOSIS — N2581 Secondary hyperparathyroidism of renal origin: Secondary | ICD-10-CM | POA: Diagnosis not present

## 2020-01-20 DIAGNOSIS — D631 Anemia in chronic kidney disease: Secondary | ICD-10-CM | POA: Diagnosis not present

## 2020-01-21 DIAGNOSIS — R278 Other lack of coordination: Secondary | ICD-10-CM | POA: Diagnosis not present

## 2020-01-21 DIAGNOSIS — G5603 Carpal tunnel syndrome, bilateral upper limbs: Secondary | ICD-10-CM | POA: Diagnosis not present

## 2020-01-21 DIAGNOSIS — G5601 Carpal tunnel syndrome, right upper limb: Secondary | ICD-10-CM | POA: Diagnosis not present

## 2020-01-22 DIAGNOSIS — R278 Other lack of coordination: Secondary | ICD-10-CM | POA: Diagnosis not present

## 2020-01-22 DIAGNOSIS — G5601 Carpal tunnel syndrome, right upper limb: Secondary | ICD-10-CM | POA: Diagnosis not present

## 2020-01-22 DIAGNOSIS — G5603 Carpal tunnel syndrome, bilateral upper limbs: Secondary | ICD-10-CM | POA: Diagnosis not present

## 2020-01-23 DIAGNOSIS — D472 Monoclonal gammopathy: Secondary | ICD-10-CM | POA: Diagnosis not present

## 2020-01-23 DIAGNOSIS — N2581 Secondary hyperparathyroidism of renal origin: Secondary | ICD-10-CM | POA: Diagnosis not present

## 2020-01-23 DIAGNOSIS — Z992 Dependence on renal dialysis: Secondary | ICD-10-CM | POA: Diagnosis not present

## 2020-01-23 DIAGNOSIS — D509 Iron deficiency anemia, unspecified: Secondary | ICD-10-CM | POA: Diagnosis not present

## 2020-01-23 DIAGNOSIS — R278 Other lack of coordination: Secondary | ICD-10-CM | POA: Diagnosis not present

## 2020-01-23 DIAGNOSIS — G5603 Carpal tunnel syndrome, bilateral upper limbs: Secondary | ICD-10-CM | POA: Diagnosis not present

## 2020-01-23 DIAGNOSIS — G5601 Carpal tunnel syndrome, right upper limb: Secondary | ICD-10-CM | POA: Diagnosis not present

## 2020-01-23 DIAGNOSIS — N186 End stage renal disease: Secondary | ICD-10-CM | POA: Diagnosis not present

## 2020-01-23 DIAGNOSIS — D631 Anemia in chronic kidney disease: Secondary | ICD-10-CM | POA: Diagnosis not present

## 2020-01-24 ENCOUNTER — Ambulatory Visit (HOSPITAL_COMMUNITY): Payer: Medicare Other

## 2020-01-24 DIAGNOSIS — F5101 Primary insomnia: Secondary | ICD-10-CM | POA: Diagnosis not present

## 2020-01-24 DIAGNOSIS — G5601 Carpal tunnel syndrome, right upper limb: Secondary | ICD-10-CM | POA: Diagnosis not present

## 2020-01-24 DIAGNOSIS — F331 Major depressive disorder, recurrent, moderate: Secondary | ICD-10-CM | POA: Diagnosis not present

## 2020-01-24 DIAGNOSIS — G5603 Carpal tunnel syndrome, bilateral upper limbs: Secondary | ICD-10-CM | POA: Diagnosis not present

## 2020-01-24 DIAGNOSIS — R278 Other lack of coordination: Secondary | ICD-10-CM | POA: Diagnosis not present

## 2020-01-25 DIAGNOSIS — D631 Anemia in chronic kidney disease: Secondary | ICD-10-CM | POA: Diagnosis not present

## 2020-01-25 DIAGNOSIS — Z992 Dependence on renal dialysis: Secondary | ICD-10-CM | POA: Diagnosis not present

## 2020-01-25 DIAGNOSIS — N186 End stage renal disease: Secondary | ICD-10-CM | POA: Diagnosis not present

## 2020-01-25 DIAGNOSIS — D509 Iron deficiency anemia, unspecified: Secondary | ICD-10-CM | POA: Diagnosis not present

## 2020-01-25 DIAGNOSIS — D472 Monoclonal gammopathy: Secondary | ICD-10-CM | POA: Diagnosis not present

## 2020-01-25 DIAGNOSIS — N2581 Secondary hyperparathyroidism of renal origin: Secondary | ICD-10-CM | POA: Diagnosis not present

## 2020-01-26 DIAGNOSIS — G5601 Carpal tunnel syndrome, right upper limb: Secondary | ICD-10-CM | POA: Diagnosis not present

## 2020-01-26 DIAGNOSIS — G5603 Carpal tunnel syndrome, bilateral upper limbs: Secondary | ICD-10-CM | POA: Diagnosis not present

## 2020-01-26 DIAGNOSIS — R278 Other lack of coordination: Secondary | ICD-10-CM | POA: Diagnosis not present

## 2020-01-27 DIAGNOSIS — Z992 Dependence on renal dialysis: Secondary | ICD-10-CM | POA: Diagnosis not present

## 2020-01-27 DIAGNOSIS — D631 Anemia in chronic kidney disease: Secondary | ICD-10-CM | POA: Diagnosis not present

## 2020-01-27 DIAGNOSIS — D472 Monoclonal gammopathy: Secondary | ICD-10-CM | POA: Diagnosis not present

## 2020-01-27 DIAGNOSIS — N186 End stage renal disease: Secondary | ICD-10-CM | POA: Diagnosis not present

## 2020-01-27 DIAGNOSIS — N2581 Secondary hyperparathyroidism of renal origin: Secondary | ICD-10-CM | POA: Diagnosis not present

## 2020-01-27 DIAGNOSIS — D509 Iron deficiency anemia, unspecified: Secondary | ICD-10-CM | POA: Diagnosis not present

## 2020-01-29 DIAGNOSIS — G5601 Carpal tunnel syndrome, right upper limb: Secondary | ICD-10-CM | POA: Diagnosis not present

## 2020-01-29 DIAGNOSIS — R278 Other lack of coordination: Secondary | ICD-10-CM | POA: Diagnosis not present

## 2020-01-29 DIAGNOSIS — G5603 Carpal tunnel syndrome, bilateral upper limbs: Secondary | ICD-10-CM | POA: Diagnosis not present

## 2020-01-30 DIAGNOSIS — Z79899 Other long term (current) drug therapy: Secondary | ICD-10-CM | POA: Diagnosis not present

## 2020-01-30 DIAGNOSIS — D631 Anemia in chronic kidney disease: Secondary | ICD-10-CM | POA: Diagnosis not present

## 2020-01-30 DIAGNOSIS — N2581 Secondary hyperparathyroidism of renal origin: Secondary | ICD-10-CM | POA: Diagnosis not present

## 2020-01-30 DIAGNOSIS — N186 End stage renal disease: Secondary | ICD-10-CM | POA: Diagnosis not present

## 2020-01-30 DIAGNOSIS — G8929 Other chronic pain: Secondary | ICD-10-CM | POA: Diagnosis not present

## 2020-01-30 DIAGNOSIS — Z992 Dependence on renal dialysis: Secondary | ICD-10-CM | POA: Diagnosis not present

## 2020-01-30 DIAGNOSIS — R52 Pain, unspecified: Secondary | ICD-10-CM | POA: Diagnosis not present

## 2020-01-30 DIAGNOSIS — D472 Monoclonal gammopathy: Secondary | ICD-10-CM | POA: Diagnosis not present

## 2020-01-30 DIAGNOSIS — D509 Iron deficiency anemia, unspecified: Secondary | ICD-10-CM | POA: Diagnosis not present

## 2020-02-01 DIAGNOSIS — N2581 Secondary hyperparathyroidism of renal origin: Secondary | ICD-10-CM | POA: Diagnosis not present

## 2020-02-01 DIAGNOSIS — D472 Monoclonal gammopathy: Secondary | ICD-10-CM | POA: Diagnosis not present

## 2020-02-01 DIAGNOSIS — N186 End stage renal disease: Secondary | ICD-10-CM | POA: Diagnosis not present

## 2020-02-01 DIAGNOSIS — R110 Nausea: Secondary | ICD-10-CM | POA: Diagnosis not present

## 2020-02-01 DIAGNOSIS — D631 Anemia in chronic kidney disease: Secondary | ICD-10-CM | POA: Diagnosis not present

## 2020-02-01 DIAGNOSIS — D509 Iron deficiency anemia, unspecified: Secondary | ICD-10-CM | POA: Diagnosis not present

## 2020-02-01 DIAGNOSIS — Z992 Dependence on renal dialysis: Secondary | ICD-10-CM | POA: Diagnosis not present

## 2020-02-03 DIAGNOSIS — D509 Iron deficiency anemia, unspecified: Secondary | ICD-10-CM | POA: Diagnosis not present

## 2020-02-03 DIAGNOSIS — D472 Monoclonal gammopathy: Secondary | ICD-10-CM | POA: Diagnosis not present

## 2020-02-03 DIAGNOSIS — R110 Nausea: Secondary | ICD-10-CM | POA: Diagnosis not present

## 2020-02-03 DIAGNOSIS — N2581 Secondary hyperparathyroidism of renal origin: Secondary | ICD-10-CM | POA: Diagnosis not present

## 2020-02-03 DIAGNOSIS — N186 End stage renal disease: Secondary | ICD-10-CM | POA: Diagnosis not present

## 2020-02-06 ENCOUNTER — Ambulatory Visit (HOSPITAL_COMMUNITY)
Admission: RE | Admit: 2020-02-06 | Discharge: 2020-02-06 | Disposition: A | Discharge status: Home / Self Care | Payer: Medicare Other | Source: Ambulatory Visit | Attending: Nurse Practitioner | Admitting: Nurse Practitioner

## 2020-02-06 ENCOUNTER — Other Ambulatory Visit: Payer: Self-pay | Admitting: Nurse Practitioner

## 2020-02-06 ENCOUNTER — Other Ambulatory Visit: Payer: Self-pay

## 2020-02-06 DIAGNOSIS — N186 End stage renal disease: Secondary | ICD-10-CM | POA: Diagnosis not present

## 2020-02-06 DIAGNOSIS — D509 Iron deficiency anemia, unspecified: Secondary | ICD-10-CM | POA: Diagnosis not present

## 2020-02-06 DIAGNOSIS — K838 Other specified diseases of biliary tract: Secondary | ICD-10-CM

## 2020-02-06 DIAGNOSIS — K8020 Calculus of gallbladder without cholecystitis without obstruction: Secondary | ICD-10-CM | POA: Diagnosis not present

## 2020-02-06 DIAGNOSIS — K828 Other specified diseases of gallbladder: Secondary | ICD-10-CM | POA: Diagnosis not present

## 2020-02-06 DIAGNOSIS — D472 Monoclonal gammopathy: Secondary | ICD-10-CM | POA: Diagnosis not present

## 2020-02-06 DIAGNOSIS — K8689 Other specified diseases of pancreas: Secondary | ICD-10-CM

## 2020-02-06 DIAGNOSIS — R110 Nausea: Secondary | ICD-10-CM | POA: Diagnosis not present

## 2020-02-06 DIAGNOSIS — N2581 Secondary hyperparathyroidism of renal origin: Secondary | ICD-10-CM | POA: Diagnosis not present

## 2020-02-06 IMAGING — MR MR MRCP
10 of 14 series · 33 of 48 positions shown · non-contrast
Comparison: CT abdomen pelvis, [DATE]

CLINICAL DATA: Right upper quadrant abdominal pain, dilated CBD and
pancreatic duct noted on prior CT

EXAM:
MRI ABDOMEN WITHOUT CONTRAST  (INCLUDING MRCP)
TECHNIQUE: Multiplanar multisequence MR imaging of the abdomen was performed.
Heavily T2-weighted images of the biliary and pancreatic ducts were
obtained, and three-dimensional MRCP images were rendered by post
processing.

[Series 4: ax haste · axial · 6.0mm · 1.19mm/px · z∈[-106,+102]mm · 3 of 30 slices shown]
[im 1/30]
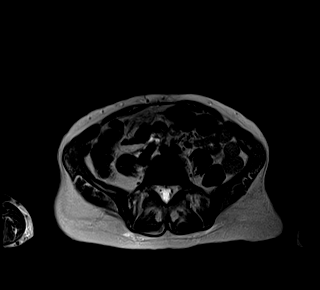
[im 15/30]
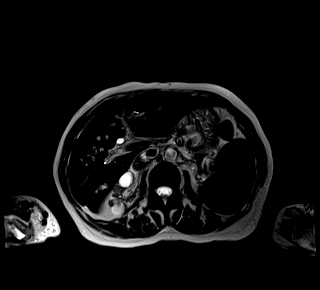
[im 30/30]
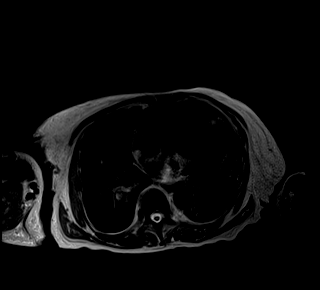

[Series 5: cor haste · coronal · 6.0mm · 1.25mm/px · 2 of 26 slices shown]
[im 1/26]
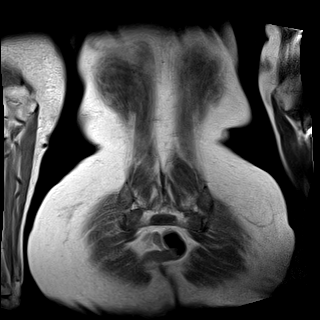
[im 26/26]
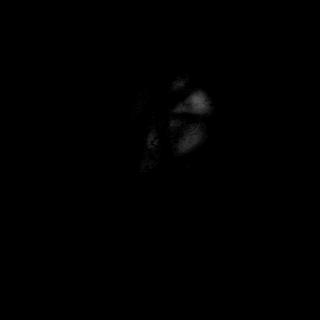

[Series 6: T2 fat-sat · axial · 6.0mm · 1.19mm/px · z∈[-111,+97]mm · 2 of 30 slices shown]
[im 1/30]
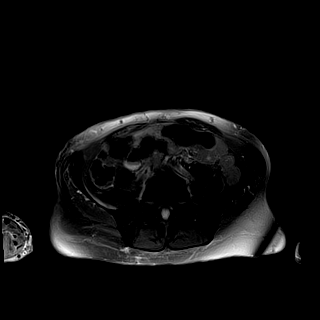
[im 30/30]
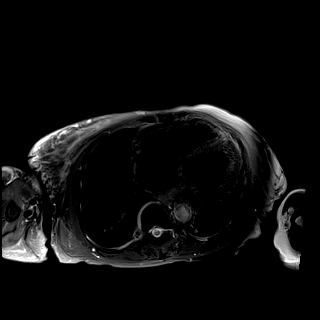

[Series 7: DWI · axial · 6.0mm · 1.42mm/px · z∈[-106,+102]mm · 2 of 30 slices shown (1 of 4)]
[im 1/30]
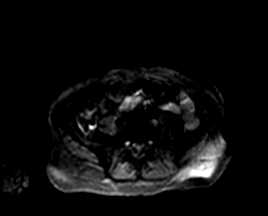
[im 30/30]
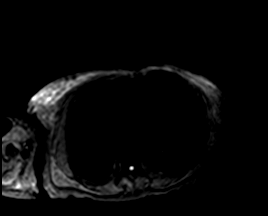

[Series 7: DWI · axial · 6.0mm · 1.42mm/px · z∈[-106,+102]mm · 2 of 30 slices shown (2 of 4)]
[im 1/30]
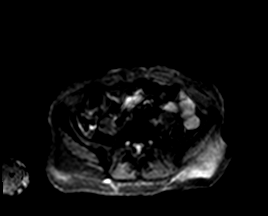
[im 30/30]
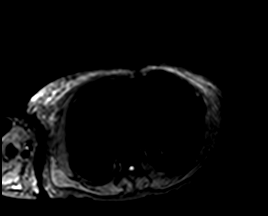

[Series 7: DWI · axial · 6.0mm · 1.42mm/px · z∈[-106,+102]mm · 2 of 30 slices shown (3 of 4)]
[im 1/30]
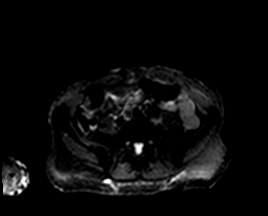
[im 30/30]
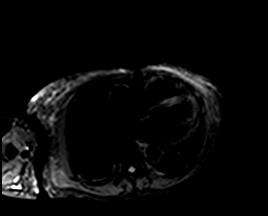

[Series 8: DWI · axial · 6.0mm · 1.42mm/px · z∈[-106,+102]mm · 2 of 30 slices shown (4 of 4)]
[im 1/30]
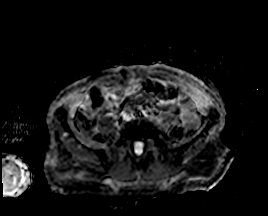
[im 30/30]
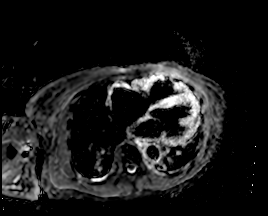

[Series 9: ax in and · axial · 3.0mm · 1.19mm/px · z∈[-116,+97]mm · 6 of 72 slices shown (1 of 2)]
[im 1/72]
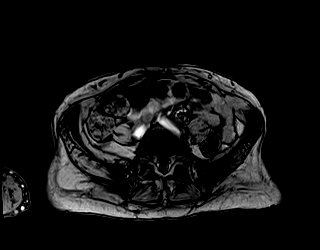
[im 15/72]
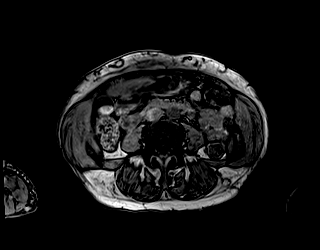
[im 29/72]
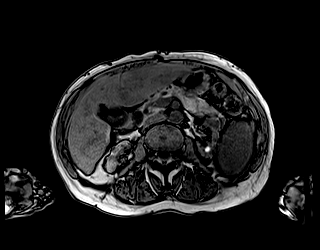
[im 43/72]
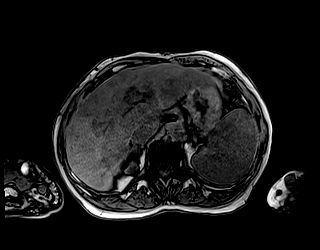
[im 57/72]
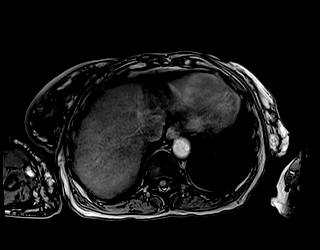
[im 72/72]
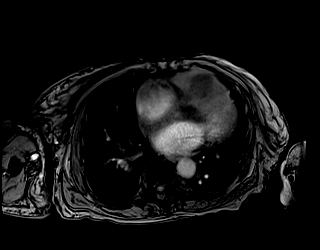

[Series 10: ax in and · axial · 3.0mm · 1.19mm/px · z∈[-116,+97]mm · 6 of 72 slices shown (2 of 2)]
[im 1/72]
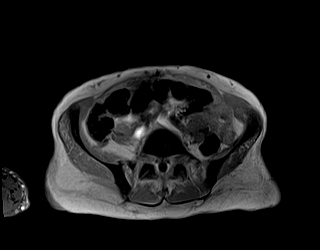
[im 15/72]
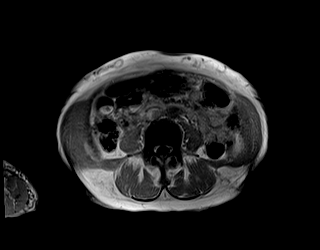
[im 29/72]
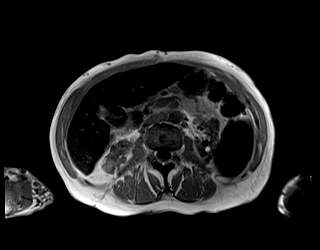
[im 43/72]
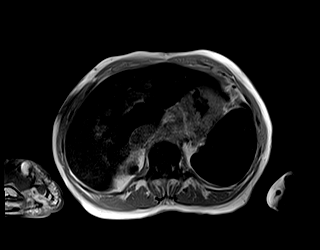
[im 57/72]
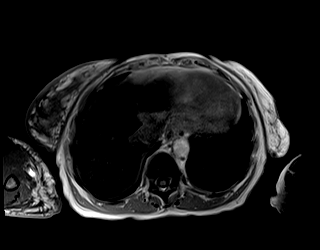
[im 72/72]
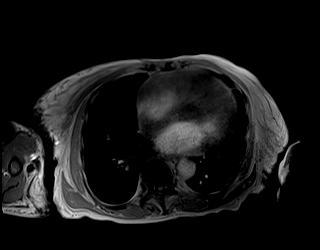

[Series 11: MRCP · coronal · 1.2mm · 0.49mm/px · 6 of 72 slices shown]
[im 1/72]
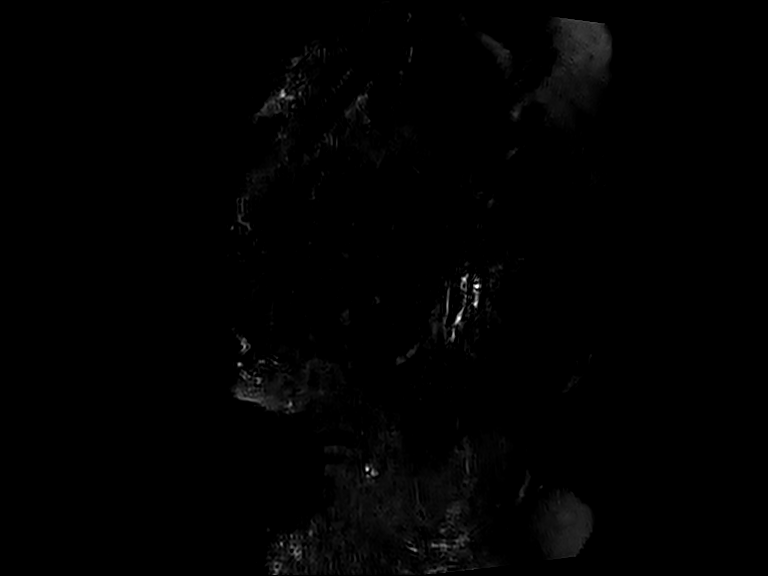
[im 15/72]
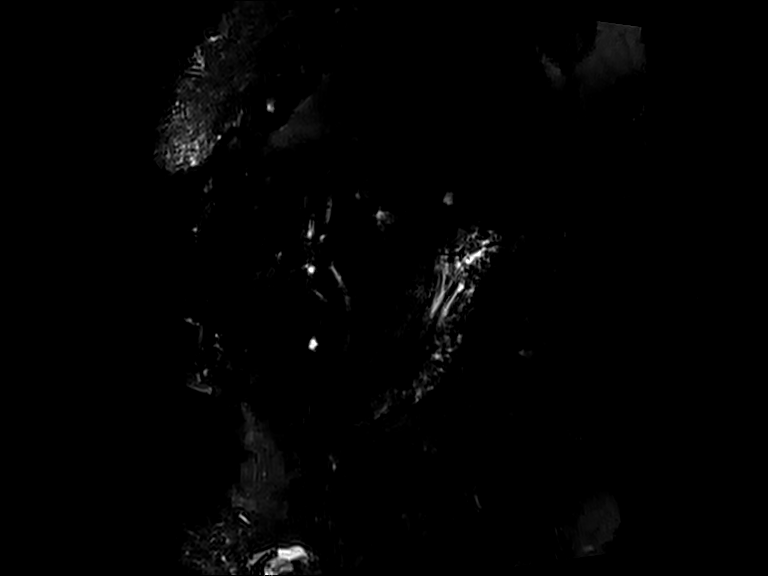
[im 29/72]
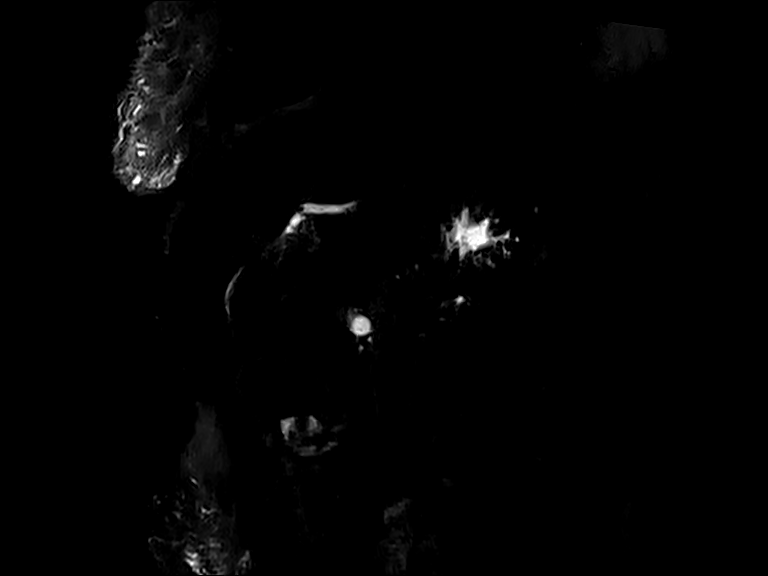
[im 43/72]
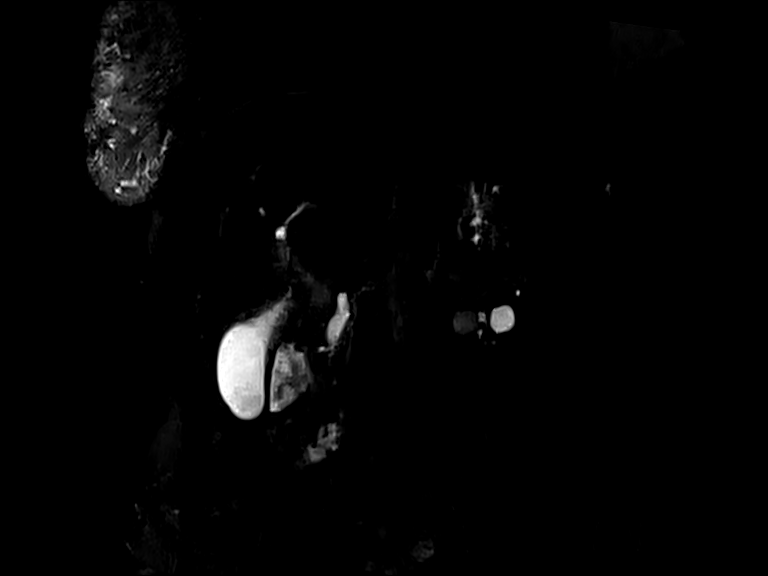
[im 57/72]
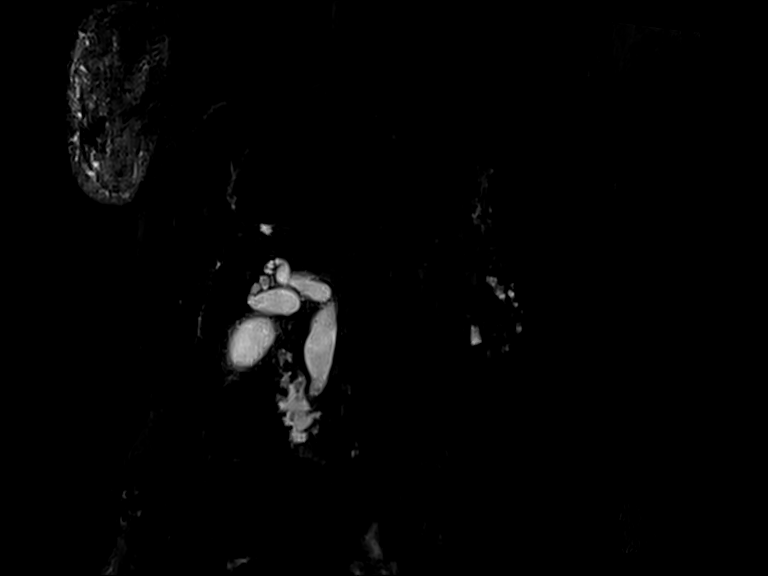
[im 72/72]
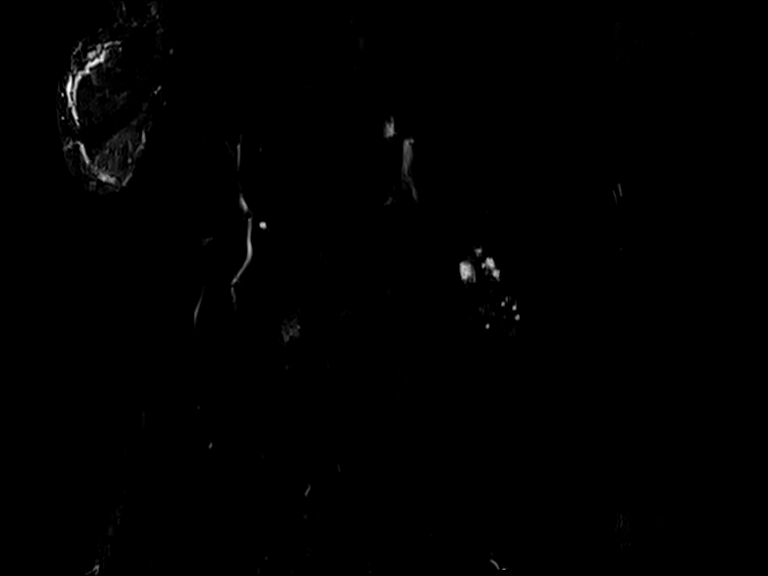

[33 of 48 positions shown; findings below may reference images not displayed]

FINDINGS: Lower chest: No acute findings.

Hepatobiliary: No mass or other parenchymal abnormality identified
by noncontrast MR. The gallbladder is mildly distended and contains
sludge and small gallstones. Redemonstrated, severe intra and
extrahepatic biliary ductal dilatation to the ampulla, common bile
duct measuring at least 1.4 cm in caliber. No evident calculus or
other obstructing lesion identified.

Pancreas: No mass, inflammatory changes, or other parenchymal
abnormality identified. The pancreatic duct is diffusely dilated to
the ampulla measuring up to 7 mm in caliber.

Spleen:  Within normal limits in size and appearance.

Adrenals/Urinary Tract: Atrophic, multicystic kidneys, in keeping
with end-stage renal disease. No masses identified on noncontrast
MR. No evidence of hydronephrosis.

Stomach/Bowel: Visualized portions within the abdomen are
unremarkable.

Vascular/Lymphatic: No pathologically enlarged lymph nodes
identified. No abdominal aortic aneurysm demonstrated.

Other:  None.

Musculoskeletal: No suspicious bone lesions identified.
IMPRESSION: 1. Redemonstrated, severe intra and extrahepatic biliary ductal
dilatation to the ampulla, common bile duct measuring at least
cm in caliber. This appearance is generally unchanged compared to
prior CT. No evident calculus or other obstructing lesion identified
on noncontrast MR.
2. The gallbladder is mildly distended and contains sludge and small
gallstones. No findings of acute cholecystitis.
3. The pancreatic duct is diffusely dilated to the ampulla measuring
up to 7 mm in caliber.
4. Consider ERCP to evaluate for ampullary lesion or stricture.
5. Atrophic, multicystic kidneys, in keeping with end-stage renal
disease.

## 2020-02-08 DIAGNOSIS — D472 Monoclonal gammopathy: Secondary | ICD-10-CM | POA: Diagnosis not present

## 2020-02-08 DIAGNOSIS — N186 End stage renal disease: Secondary | ICD-10-CM | POA: Diagnosis not present

## 2020-02-08 DIAGNOSIS — D509 Iron deficiency anemia, unspecified: Secondary | ICD-10-CM | POA: Diagnosis not present

## 2020-02-08 DIAGNOSIS — N2581 Secondary hyperparathyroidism of renal origin: Secondary | ICD-10-CM | POA: Diagnosis not present

## 2020-02-08 DIAGNOSIS — R110 Nausea: Secondary | ICD-10-CM | POA: Diagnosis not present

## 2020-02-10 DIAGNOSIS — R110 Nausea: Secondary | ICD-10-CM | POA: Diagnosis not present

## 2020-02-10 DIAGNOSIS — D472 Monoclonal gammopathy: Secondary | ICD-10-CM | POA: Diagnosis not present

## 2020-02-10 DIAGNOSIS — N2581 Secondary hyperparathyroidism of renal origin: Secondary | ICD-10-CM | POA: Diagnosis not present

## 2020-02-10 DIAGNOSIS — D509 Iron deficiency anemia, unspecified: Secondary | ICD-10-CM | POA: Diagnosis not present

## 2020-02-10 DIAGNOSIS — N186 End stage renal disease: Secondary | ICD-10-CM | POA: Diagnosis not present

## 2020-02-13 DIAGNOSIS — R52 Pain, unspecified: Secondary | ICD-10-CM | POA: Diagnosis not present

## 2020-02-13 DIAGNOSIS — N186 End stage renal disease: Secondary | ICD-10-CM | POA: Diagnosis not present

## 2020-02-13 DIAGNOSIS — G8929 Other chronic pain: Secondary | ICD-10-CM | POA: Diagnosis not present

## 2020-02-13 DIAGNOSIS — N2581 Secondary hyperparathyroidism of renal origin: Secondary | ICD-10-CM | POA: Diagnosis not present

## 2020-02-13 DIAGNOSIS — R110 Nausea: Secondary | ICD-10-CM | POA: Diagnosis not present

## 2020-02-13 DIAGNOSIS — D472 Monoclonal gammopathy: Secondary | ICD-10-CM | POA: Diagnosis not present

## 2020-02-13 DIAGNOSIS — Z79899 Other long term (current) drug therapy: Secondary | ICD-10-CM | POA: Diagnosis not present

## 2020-02-13 DIAGNOSIS — D509 Iron deficiency anemia, unspecified: Secondary | ICD-10-CM | POA: Diagnosis not present

## 2020-02-15 ENCOUNTER — Ambulatory Visit: Payer: Medicare Other | Admitting: Nurse Practitioner

## 2020-02-15 ENCOUNTER — Encounter: Payer: Self-pay | Admitting: Nurse Practitioner

## 2020-02-15 DIAGNOSIS — N186 End stage renal disease: Secondary | ICD-10-CM | POA: Diagnosis not present

## 2020-02-15 DIAGNOSIS — N2581 Secondary hyperparathyroidism of renal origin: Secondary | ICD-10-CM | POA: Diagnosis not present

## 2020-02-15 DIAGNOSIS — R110 Nausea: Secondary | ICD-10-CM | POA: Diagnosis not present

## 2020-02-15 DIAGNOSIS — D509 Iron deficiency anemia, unspecified: Secondary | ICD-10-CM | POA: Diagnosis not present

## 2020-02-15 DIAGNOSIS — D472 Monoclonal gammopathy: Secondary | ICD-10-CM | POA: Diagnosis not present

## 2020-02-17 DIAGNOSIS — N2581 Secondary hyperparathyroidism of renal origin: Secondary | ICD-10-CM | POA: Diagnosis not present

## 2020-02-17 DIAGNOSIS — D472 Monoclonal gammopathy: Secondary | ICD-10-CM | POA: Diagnosis not present

## 2020-02-17 DIAGNOSIS — R110 Nausea: Secondary | ICD-10-CM | POA: Diagnosis not present

## 2020-02-17 DIAGNOSIS — N186 End stage renal disease: Secondary | ICD-10-CM | POA: Diagnosis not present

## 2020-02-17 DIAGNOSIS — D509 Iron deficiency anemia, unspecified: Secondary | ICD-10-CM | POA: Diagnosis not present

## 2020-02-20 DIAGNOSIS — Z79899 Other long term (current) drug therapy: Secondary | ICD-10-CM | POA: Diagnosis not present

## 2020-02-20 DIAGNOSIS — G8929 Other chronic pain: Secondary | ICD-10-CM | POA: Diagnosis not present

## 2020-02-20 DIAGNOSIS — N2581 Secondary hyperparathyroidism of renal origin: Secondary | ICD-10-CM | POA: Diagnosis not present

## 2020-02-20 DIAGNOSIS — N186 End stage renal disease: Secondary | ICD-10-CM | POA: Diagnosis not present

## 2020-02-20 DIAGNOSIS — D472 Monoclonal gammopathy: Secondary | ICD-10-CM | POA: Diagnosis not present

## 2020-02-20 DIAGNOSIS — R0989 Other specified symptoms and signs involving the circulatory and respiratory systems: Secondary | ICD-10-CM | POA: Diagnosis not present

## 2020-02-20 DIAGNOSIS — J029 Acute pharyngitis, unspecified: Secondary | ICD-10-CM | POA: Diagnosis not present

## 2020-02-20 DIAGNOSIS — D509 Iron deficiency anemia, unspecified: Secondary | ICD-10-CM | POA: Diagnosis not present

## 2020-02-20 DIAGNOSIS — R110 Nausea: Secondary | ICD-10-CM | POA: Diagnosis not present

## 2020-02-22 DIAGNOSIS — D472 Monoclonal gammopathy: Secondary | ICD-10-CM | POA: Diagnosis not present

## 2020-02-22 DIAGNOSIS — D509 Iron deficiency anemia, unspecified: Secondary | ICD-10-CM | POA: Diagnosis not present

## 2020-02-22 DIAGNOSIS — R110 Nausea: Secondary | ICD-10-CM | POA: Diagnosis not present

## 2020-02-22 DIAGNOSIS — N2581 Secondary hyperparathyroidism of renal origin: Secondary | ICD-10-CM | POA: Diagnosis not present

## 2020-02-22 DIAGNOSIS — N186 End stage renal disease: Secondary | ICD-10-CM | POA: Diagnosis not present

## 2020-02-25 DIAGNOSIS — R110 Nausea: Secondary | ICD-10-CM | POA: Diagnosis not present

## 2020-02-25 DIAGNOSIS — D509 Iron deficiency anemia, unspecified: Secondary | ICD-10-CM | POA: Diagnosis not present

## 2020-02-25 DIAGNOSIS — D472 Monoclonal gammopathy: Secondary | ICD-10-CM | POA: Diagnosis not present

## 2020-02-25 DIAGNOSIS — N186 End stage renal disease: Secondary | ICD-10-CM | POA: Diagnosis not present

## 2020-02-25 DIAGNOSIS — N2581 Secondary hyperparathyroidism of renal origin: Secondary | ICD-10-CM | POA: Diagnosis not present

## 2020-02-27 ENCOUNTER — Ambulatory Visit: Payer: Medicare Other | Admitting: Nurse Practitioner

## 2020-02-27 DIAGNOSIS — D509 Iron deficiency anemia, unspecified: Secondary | ICD-10-CM | POA: Diagnosis not present

## 2020-02-27 DIAGNOSIS — Z992 Dependence on renal dialysis: Secondary | ICD-10-CM | POA: Diagnosis not present

## 2020-02-27 DIAGNOSIS — N2581 Secondary hyperparathyroidism of renal origin: Secondary | ICD-10-CM | POA: Diagnosis not present

## 2020-02-27 DIAGNOSIS — R110 Nausea: Secondary | ICD-10-CM | POA: Diagnosis not present

## 2020-02-27 DIAGNOSIS — D472 Monoclonal gammopathy: Secondary | ICD-10-CM | POA: Diagnosis not present

## 2020-02-27 DIAGNOSIS — N186 End stage renal disease: Secondary | ICD-10-CM | POA: Diagnosis not present

## 2020-02-28 DIAGNOSIS — I10 Essential (primary) hypertension: Secondary | ICD-10-CM | POA: Diagnosis not present

## 2020-02-28 DIAGNOSIS — G8929 Other chronic pain: Secondary | ICD-10-CM | POA: Diagnosis not present

## 2020-02-28 DIAGNOSIS — E785 Hyperlipidemia, unspecified: Secondary | ICD-10-CM | POA: Diagnosis not present

## 2020-02-29 DIAGNOSIS — N186 End stage renal disease: Secondary | ICD-10-CM | POA: Diagnosis not present

## 2020-02-29 DIAGNOSIS — R110 Nausea: Secondary | ICD-10-CM | POA: Diagnosis not present

## 2020-02-29 DIAGNOSIS — D509 Iron deficiency anemia, unspecified: Secondary | ICD-10-CM | POA: Diagnosis not present

## 2020-02-29 DIAGNOSIS — D472 Monoclonal gammopathy: Secondary | ICD-10-CM | POA: Diagnosis not present

## 2020-02-29 DIAGNOSIS — N2581 Secondary hyperparathyroidism of renal origin: Secondary | ICD-10-CM | POA: Diagnosis not present

## 2020-03-01 DIAGNOSIS — N186 End stage renal disease: Secondary | ICD-10-CM | POA: Diagnosis not present

## 2020-03-01 DIAGNOSIS — Z992 Dependence on renal dialysis: Secondary | ICD-10-CM | POA: Diagnosis not present

## 2020-03-05 ENCOUNTER — Inpatient Hospital Stay (HOSPITAL_COMMUNITY): Payer: Medicare Other | Attending: Hematology

## 2020-03-12 ENCOUNTER — Ambulatory Visit (HOSPITAL_COMMUNITY): Payer: Medicare Other | Admitting: Oncology

## 2020-03-19 ENCOUNTER — Other Ambulatory Visit: Payer: Self-pay | Admitting: Nurse Practitioner

## 2020-03-19 DIAGNOSIS — K838 Other specified diseases of biliary tract: Secondary | ICD-10-CM

## 2020-04-06 ENCOUNTER — Encounter (HOSPITAL_COMMUNITY): Payer: Self-pay | Admitting: *Deleted

## 2020-04-06 ENCOUNTER — Emergency Department (HOSPITAL_COMMUNITY): Payer: Medicare Other

## 2020-04-06 ENCOUNTER — Emergency Department (HOSPITAL_COMMUNITY)
Admission: EM | Admit: 2020-04-06 | Discharge: 2020-04-06 | Disposition: A | Discharge status: Other Acute Inpatient Hospital | Payer: Medicare Other | Attending: Emergency Medicine | Admitting: Emergency Medicine

## 2020-04-06 ENCOUNTER — Other Ambulatory Visit: Payer: Self-pay

## 2020-04-06 DIAGNOSIS — Z8673 Personal history of transient ischemic attack (TIA), and cerebral infarction without residual deficits: Secondary | ICD-10-CM | POA: Insufficient documentation

## 2020-04-06 DIAGNOSIS — Z87891 Personal history of nicotine dependence: Secondary | ICD-10-CM | POA: Diagnosis not present

## 2020-04-06 DIAGNOSIS — W050XXA Fall from non-moving wheelchair, initial encounter: Secondary | ICD-10-CM | POA: Insufficient documentation

## 2020-04-06 DIAGNOSIS — Z79899 Other long term (current) drug therapy: Secondary | ICD-10-CM | POA: Diagnosis not present

## 2020-04-06 DIAGNOSIS — R0781 Pleurodynia: Secondary | ICD-10-CM | POA: Diagnosis not present

## 2020-04-06 DIAGNOSIS — N186 End stage renal disease: Secondary | ICD-10-CM | POA: Insufficient documentation

## 2020-04-06 DIAGNOSIS — I132 Hypertensive heart and chronic kidney disease with heart failure and with stage 5 chronic kidney disease, or end stage renal disease: Secondary | ICD-10-CM | POA: Insufficient documentation

## 2020-04-06 DIAGNOSIS — Z91040 Latex allergy status: Secondary | ICD-10-CM | POA: Diagnosis not present

## 2020-04-06 DIAGNOSIS — I509 Heart failure, unspecified: Secondary | ICD-10-CM | POA: Diagnosis not present

## 2020-04-06 DIAGNOSIS — Z992 Dependence on renal dialysis: Secondary | ICD-10-CM | POA: Insufficient documentation

## 2020-04-06 DIAGNOSIS — Z7902 Long term (current) use of antithrombotics/antiplatelets: Secondary | ICD-10-CM | POA: Insufficient documentation

## 2020-04-06 DIAGNOSIS — B20 Human immunodeficiency virus [HIV] disease: Secondary | ICD-10-CM | POA: Insufficient documentation

## 2020-04-06 IMAGING — DX DG RIBS W/ CHEST 3+V*R*
3 series · 3 of 3 positions shown · non-contrast
Comparison: [DATE]

CLINICAL DATA: Fell, right anterior rib pain

EXAM:
RIGHT RIBS AND CHEST - 3+ VIEW

[chest pa]
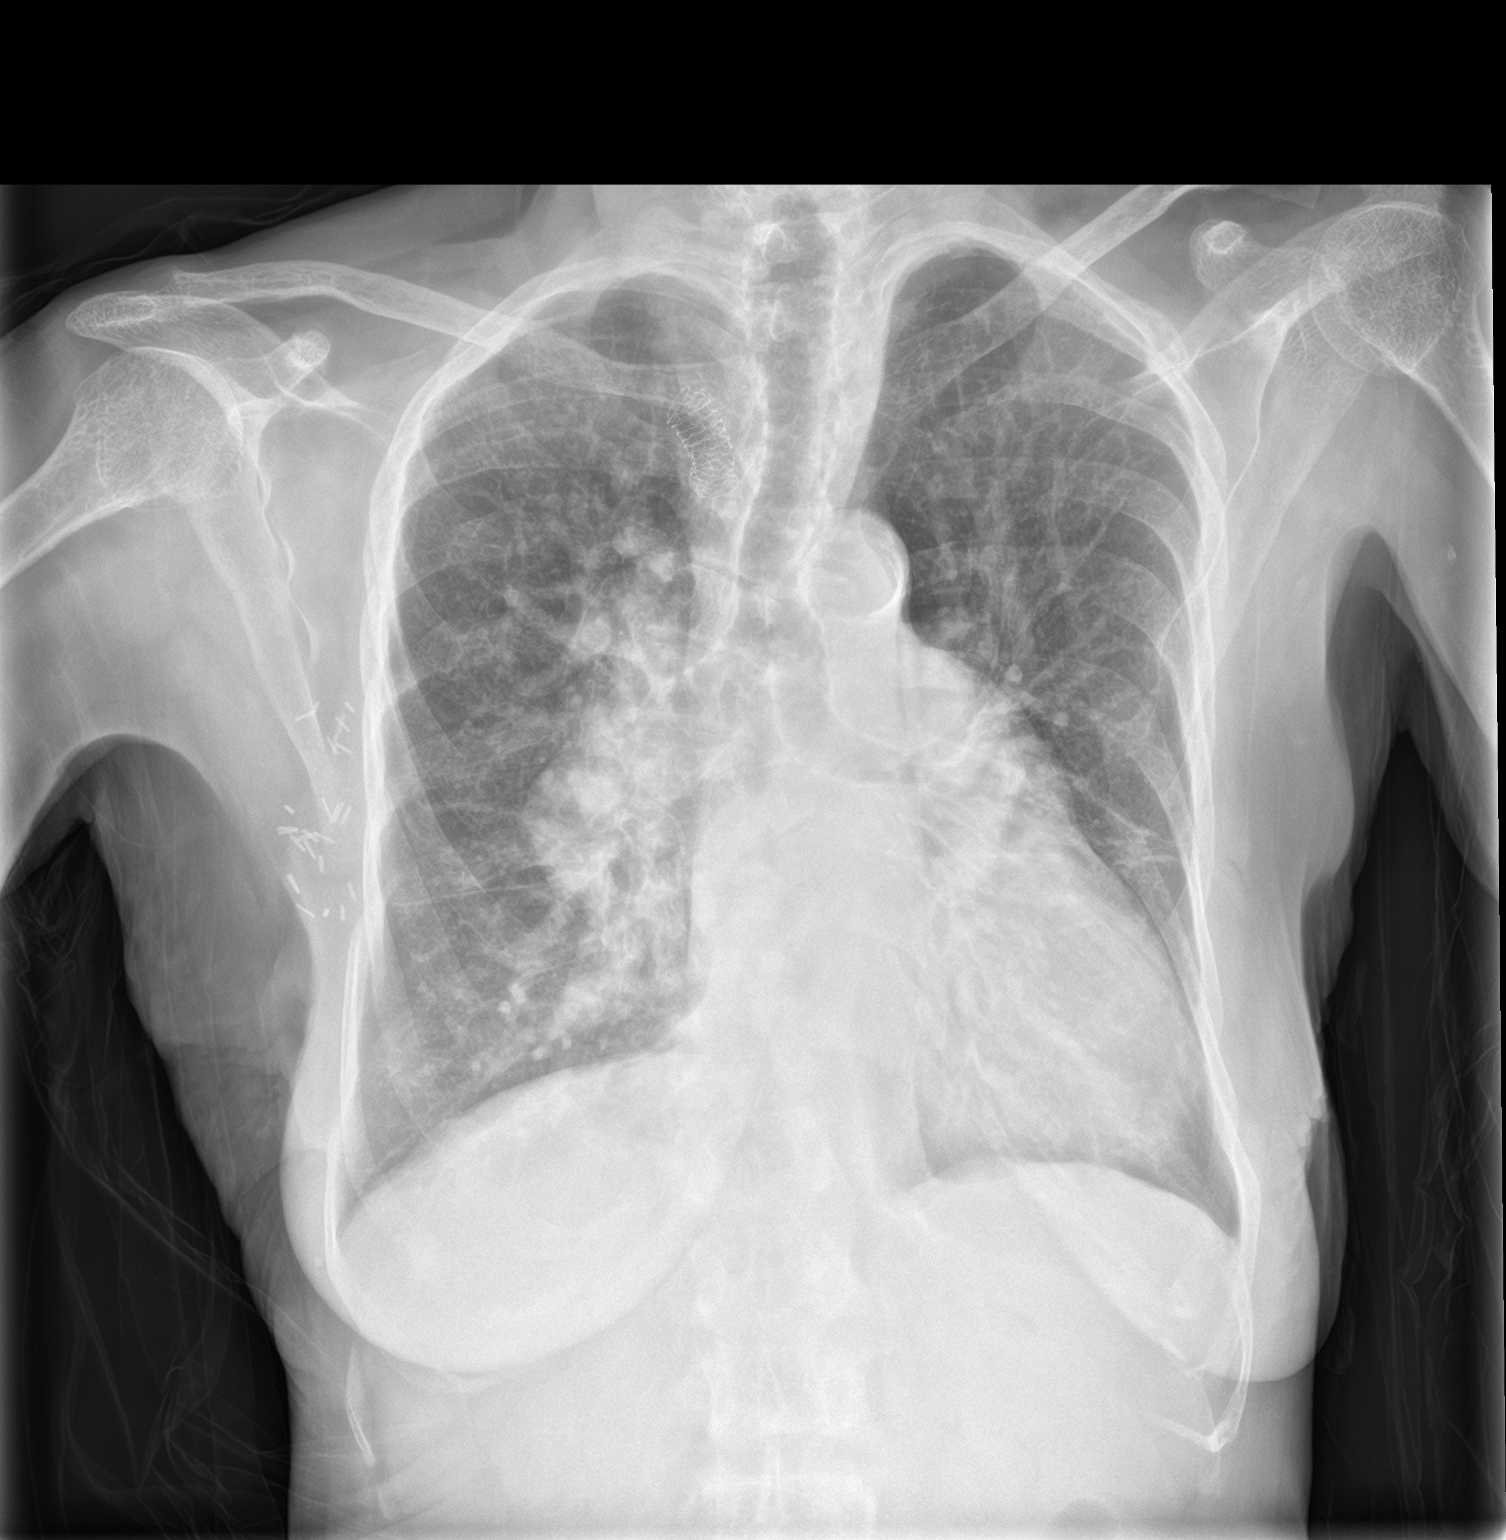

[rib pa obl]
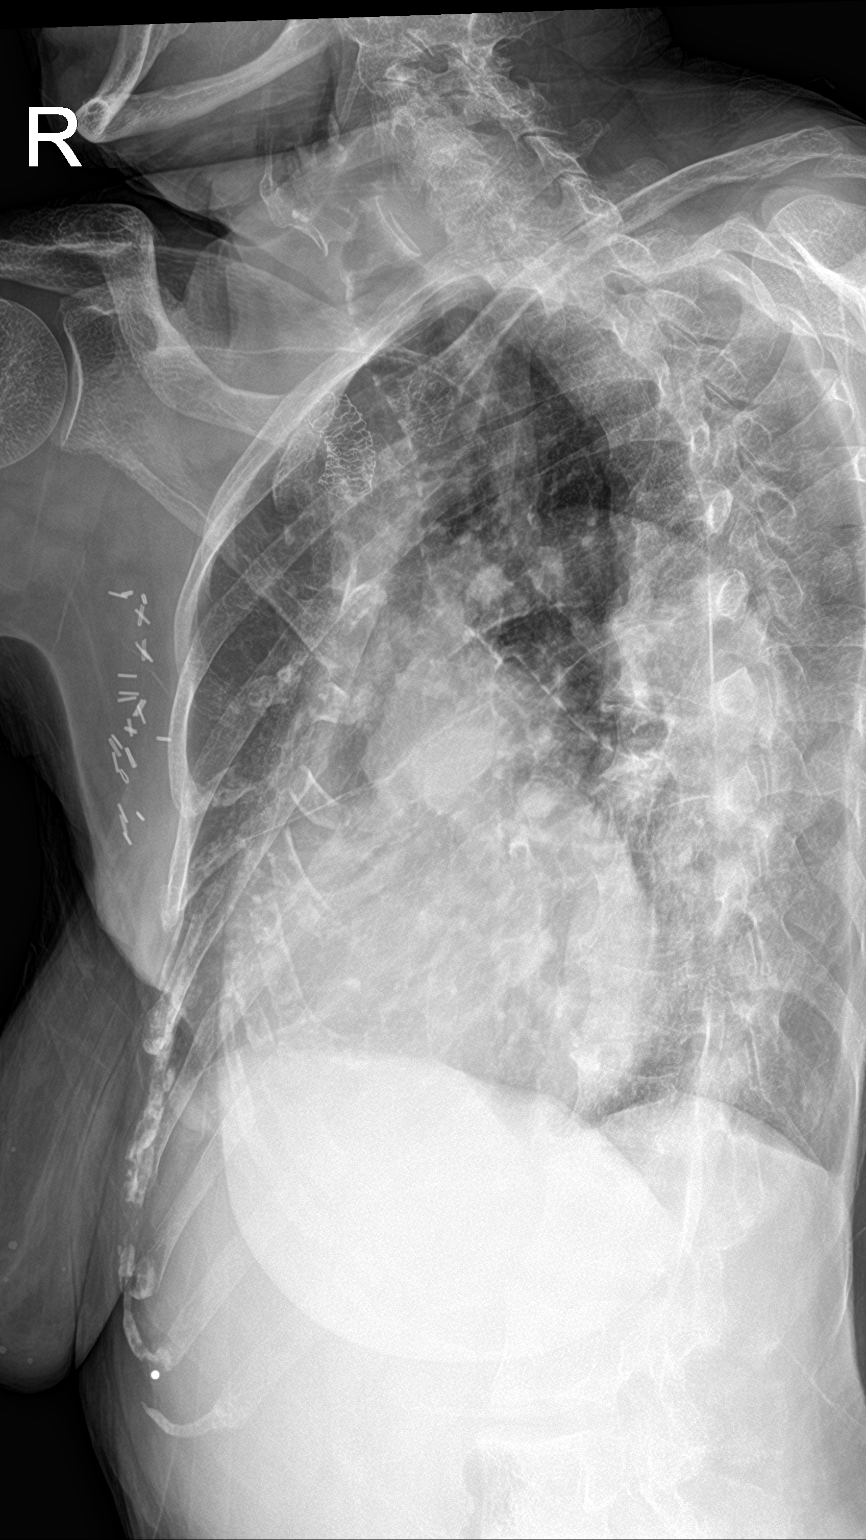

[rib pa]
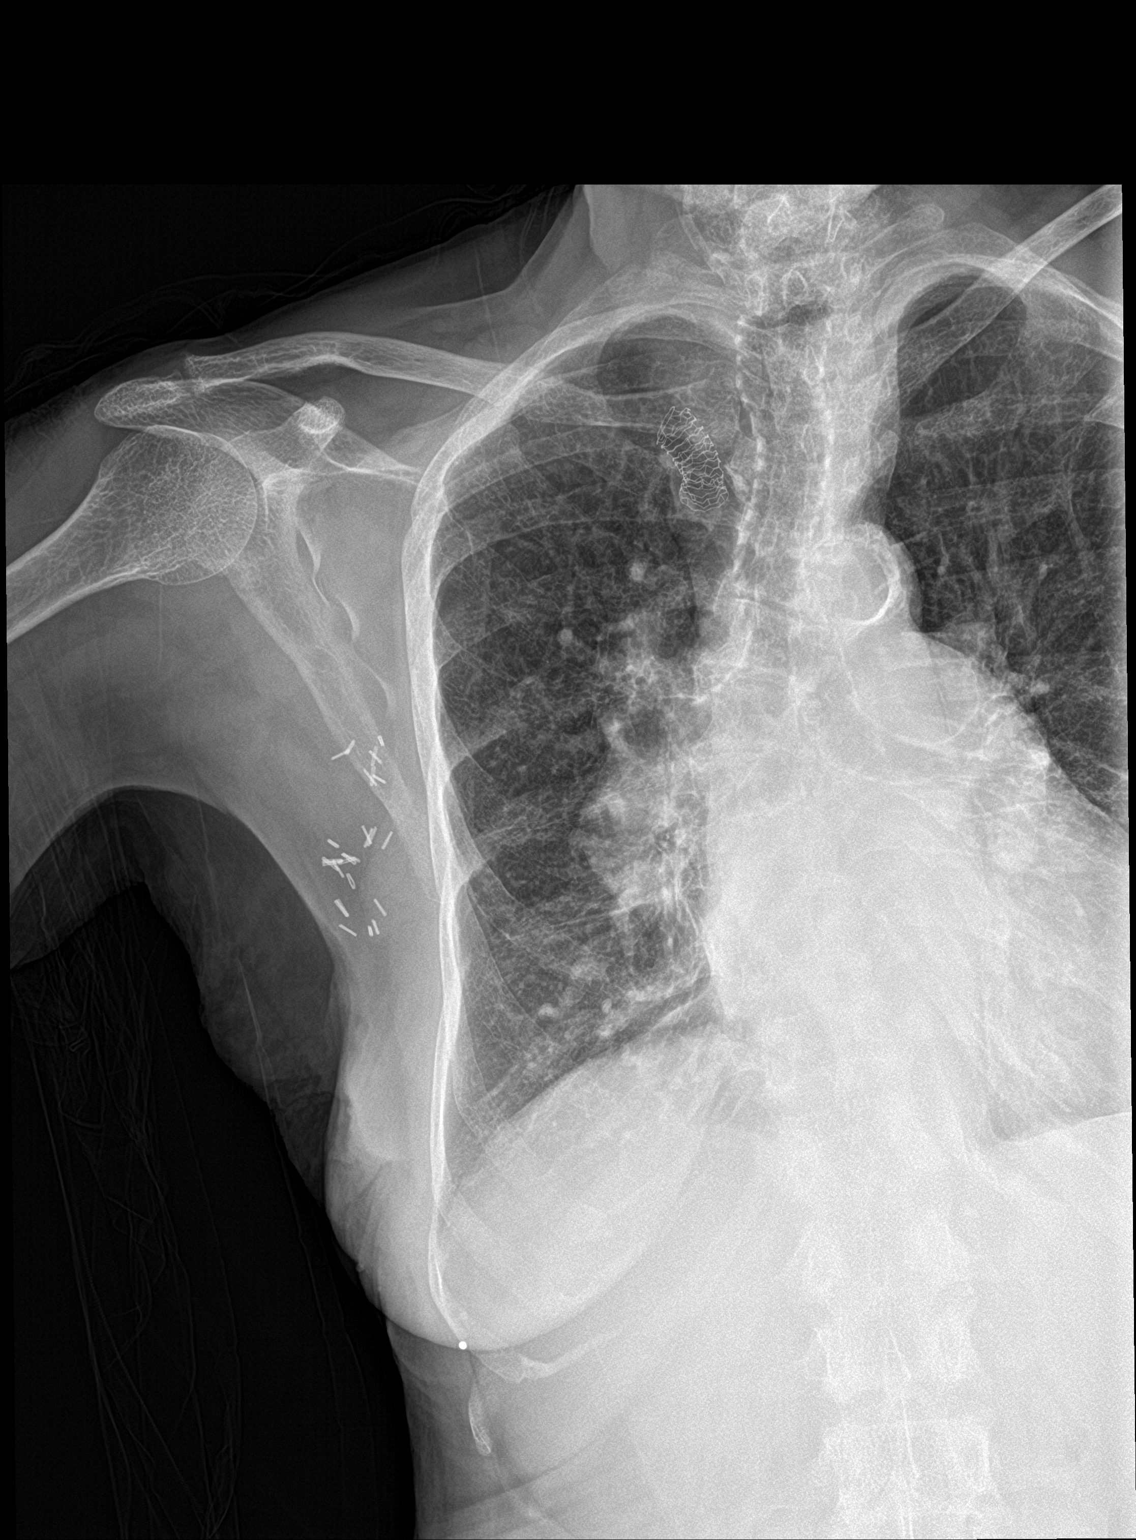

[3 of 3 positions shown; findings below may reference images not displayed]

FINDINGS: Frontal view of the chest as well as frontal and oblique views of
the right thoracic cage are obtained. Continued enlargement of the
cardiac silhouette with marked distension of the central pulmonary
vasculature. No acute airspace disease, effusion, or pneumothorax.
Vascular stent in the region of the right subclavian artery. There
are no acute displaced fractures.
IMPRESSION: 1. No acute displaced fractures.
2. Stable cardiomegaly and pulmonary vascular congestion.

## 2020-04-06 MED ORDER — ACETAMINOPHEN 325 MG PO TABS
650.0000 mg | ORAL_TABLET | Freq: Once | ORAL | Status: AC
  Administered 2020-04-06: 650 mg via ORAL
  Filled 2020-04-06: qty 2

## 2020-04-06 NOTE — ED Triage Notes (Signed)
Pt returning from dialysis back to facility [facility]) in wheelchair from [employer].  Pt fell from [place] to ground.  Reported that pt had landed some on personnel.  Pt states she hit back of her head on the van.  Reported no LOC.  Pt c/o HA and right rib pain.

## 2020-04-06 NOTE — Discharge Instructions (Addendum)
Call your primary care doctor or specialist as discussed in the next 2-3 days.   Return immediately back to the ER if:  Your symptoms worsen within the next 12-24 hours. You develop new symptoms such as new fevers, persistent vomiting, new pain, shortness of breath, or new weakness or numbness, or if you have any other concerns.  

## 2020-04-06 NOTE — ED Provider Notes (Signed)
[facility] EMERGENCY DEPARTMENT Provider Note   CSN: [id number] Arrival date & time: 04/06/20  1751     History Chief Complaint  Patient presents with  . Fall    [name] is a 70 y.o. female.  Patient complaining of right lateral rib pain.  She states that she had finished dialysis was being transported back to her facility via [facility] when she fell out of her wheelchair and hit her right flank side.  Complaining of sharp pains are nonradiating.  Triage notation states patient hit her head, however she denies any headache or neck pain or back pain.  No loss of consciousness reported.  No recent illnesses no fever cough vomiting or diarrhea.        Past Medical History:  Diagnosis Date  . Anemia   . Anxiety   . Atrial flutter ([place]) 02/22/2015  . C. difficile colitis 10/10/11  . Chronic diarrhea   . Chronic pain   . Depression   . ESRD on hemodialysis [facility])    Dialysis [facility]  . GERD (gastroesophageal reflux disease)   . HIV (human immunodeficiency virus infection) ([place])   . Hypertension   . Insomnia   . Intracranial hemorrhage ([place])   . Muscle weakness (generalized)   . Pulmonary HTN ([place])   . Tachycardia   . TIA (transient ischemic attack)    [facility] do not have this dx  . Traumatic hematoma of right upper arm 02/22/2015    Patient Active Problem List   Diagnosis Date Noted  . Common bile duct dilation   . Lobar pneumonia ([place]) 07/20/2019  . Acute respiratory failure with hypoxia ([place]) 07/20/2019  . Colitis 07/19/2019  . Dilation of common bile duct 07/19/2019  . Ventral hernia without obstruction or gangrene   . Hypocalcemia 03/07/2019  . Other pancytopenia ([place]) 12/08/2018  . Anemia of chronic disease 04/17/2016  . Hepatitis B immune 01/28/2016  . Atrial flutter ([place]) 02/22/2015  . Hyperkalemia 02/20/2015  . Venous stenosis of right upper extremity 04/06/2014  . ESRD on dialysis ([place]) 04/06/2014  . Essential hypertension    . Nausea with vomiting 12/15/2012  . CHF, chronic ([place]) 11/25/2012  . Atrial fibrillation ([place]) 05/02/2012  . Intracranial bleed ([place]) 12/28/2011  . Angioedema of lips 09/21/2011  . Cellulitis of breast 03/09/2011  . Erosive esophagitis 12/15/2010  . Mallory - Weiss tear 12/15/2010  . UGI bleed 12/13/2010  . DIARRHEA 04/12/2009  . Human immunodeficiency virus (HIV) disease ([place]) 11/15/2008  . PANCREATITIS, HX OF 11/15/2008  . TOBACCO USE, QUIT 11/15/2008  . PAIN IN JOINT, UPPER ARM 08/07/2008  . ABDOMINAL WALL HERNIA 04/26/2007  . BRONCHITIS, ACUTE 11/16/2006  . HYPOKALEMIA, HX OF 07/13/2006  . DEPRESSION 03/23/2006  . Essential hypertension, malignant 03/23/2006  . GERD 03/23/2006  . PANCREATITIS 03/23/2006  . MASS, RIGHT AXILLA 03/23/2006  . HEADACHE 03/23/2006  . SYMPTOM, ENLARGEMENT, LYMPH NODES 03/23/2006  . SHINGLES, HX OF 03/23/2006  . HYSTERECTOMY, TOTAL, HX OF 03/23/2006    Past Surgical History:  Procedure Laterality Date  . A/V FISTULAGRAM N/A 04/17/2016   Procedure: A/V Fistulagram - Right Upper;  Surgeon: Waynetta Sandy, MD;  Location: [place] [facility];  Service: Cardiovascular;  Laterality: N/A;  . A/V FISTULAGRAM Right 08/28/2019   Procedure: A/V FISTULAGRAM;  Surgeon: Waynetta Sandy, MD;  Location: [place] [facility];  Service: Cardiovascular;  Laterality: Right;  . ABDOMINAL HYSTERECTOMY    . BIOPSY  01/02/2020  Procedure: BIOPSY;  Surgeon: Eloise Harman, DO;  Location: [facility];  Service: Endoscopy;;  . BIOPSY THYROID    . CARPAL TUNNEL RELEASE Right 11/16/2019   Procedure: CARPAL TUNNEL RELEASE;  Surgeon: Leanora Cover, MD;  Location: [place];  Service: Orthopedics;  Laterality: Right;  . COLONOSCOPY WITH PROPOFOL N/A 01/02/2020   Procedure: COLONOSCOPY WITH PROPOFOL;  Surgeon: Eloise Harman, DO;  Location: [facility];  Service: Endoscopy;  Laterality: N/A;  1:15pm-pt at [facility]  . Dialysis Shunts      previous one removed from left arm and now present one in right arm  . DIALYSIS/PERMA CATHETER INSERTION Right 04/17/2016   Procedure: dialysis Catheter Insertion central veinous;  Surgeon: Waynetta Sandy, MD;  Location: [place] [facility];  Service: Cardiovascular;  Laterality: Right;  . ESOPHAGOGASTRODUODENOSCOPY  12/15/2010   [place]: erosive reflux esophagitis, MW tear, hiatal hernia (small), gastritis with no h.pylori, possibly nsaid related  . EXCISION OF BREAST BIOPSY Right 05/04/2012   Procedure: EXCISION OF BREAST BIOPSY;  Surgeon: Donato Heinz, MD;  Location: [facility];  Service: General;  Laterality: Right;  Right Excisional Breast Biopsy  . FISTULOGRAM Right 03/26/2014   Procedure: Right Arm Fistulogram with Venoplasty Right Subclavian Vein and Inominate Vein. Debridement Fistula Ulcer;  Surgeon: Conrad Halesite, MD;  Location: [place];  Service: Vascular;  Laterality: Right;  . FISTULOGRAM Right 03/29/2017   Procedure: FISTULOGRAM COMPLEX RIGHT ARM with Balloon angioplasty;  Surgeon: Waynetta Sandy, MD;  Location: [place];  Service: Vascular;  Laterality: Right;  . IR GENERIC HISTORICAL  05/19/2016   IR REMOVAL TUN CV CATH W/O FL 05/19/2016 Markus Daft, MD [facility]-INTERV RAD  . PERIPHERAL VASCULAR BALLOON ANGIOPLASTY Right 04/17/2016   Procedure: Peripheral Vascular Balloon Angioplasty;  Surgeon: Waynetta Sandy, MD;  Location: [place] [facility];  Service: Cardiovascular;  Laterality: Right;  Central segment AV Fistula  . PERIPHERAL VASCULAR BALLOON ANGIOPLASTY Right 03/14/2018   Procedure: PERIPHERAL VASCULAR BALLOON ANGIOPLASTY;  Surgeon: Waynetta Sandy, MD;  Location: [place] [facility];  Service: Cardiovascular;  Laterality: Right;  subclavian vein  . PERIPHERAL VASCULAR BALLOON ANGIOPLASTY Right 08/28/2019   Procedure: PERIPHERAL VASCULAR BALLOON ANGIOPLASTY;  Surgeon: Waynetta Sandy, MD;  Location: [place] [facility];  Service: Cardiovascular;   Laterality: Right;  upper arm  . PERIPHERAL VASCULAR INTERVENTION Right 03/14/2018   Procedure: PERIPHERAL VASCULAR INTERVENTION;  Surgeon: Waynetta Sandy, MD;  Location: [place] [facility];  Service: Cardiovascular;  Laterality: Right;  subclavian vein  . SHUNTOGRAM Right 10/19/2013   Procedure: FISTULOGRAM;  Surgeon: Conrad Roosevelt, MD;  Location[facility] C[facility] LAB;  Service: Cardiovascular;  Laterality: Right;  . TONSILLECTOMY    . VISCERAL VENOGRAPHY Right 03/14/2018   Procedure: CENTRAL VENOGRAPHY;  Surgeon: Waynetta Sandy, MD;  Location: [place] [facility];  Service: Cardiovascular;  Laterality: Right;  upper ext fistula     OB History    Gravida  1   Para  1   Term  1   Preterm      AB      Living  1     SAB      IAB      Ectopic      Multiple      Live Births              Family History  Problem Relation Age of Onset  . Anesthesia problems Neg Hx   . Hypotension  Neg Hx   . Malignant hyperthermia Neg Hx   . Pseudochol deficiency Neg Hx   . Colon cancer Neg Hx        Unsure of parents who both died when she was a baby    Social History   Tobacco Use  . Smoking status: Former Smoker    Types: Cigarettes    Quit date: 03/12/1983    Years since quitting: 37.0  . Smokeless tobacco: Never Used  Vaping Use  . Vaping Use: Never used  Substance Use Topics  . Alcohol use: No    Alcohol/week: 0.0 standard drinks  . Drug use: No    Home Medications Prior to Admission medications   Medication Sig Start Date End Date Taking? Authorizing Provider  Biotin 5000 MCG CAPS Take 5,000 mcg by mouth daily. (0800)    [provider]  calcium acetate, Phos Binder, (PHOSLYRA) 667 MG/5ML SOLN Take 2,001 mg by mouth 3 (three) times daily with meals. (0800, 1200, & 2000)    [provider]  calcium elemental as carbonate (BARIATRIC TUMS ULTRA) 400 MG chewable tablet Chew 1,000 mg by mouth 2 (two) times daily.    [provider]   clopidogrel (PLAVIX) 75 MG tablet Take 1 tablet (75 mg total) by mouth daily. Patient taking differently: Take 75 mg by mouth daily. (0800) 03/29/17   Waynetta Sandy, MD  cyanocobalamin (,VITAMIN B-12,) 1000 MCG/ML injection Inject 1 mL (1,000 mcg total) into the muscle every 28 (twenty-eight) days. 10/25/19   Derek Jack, MD  dolutegravir (TIVICAY) 50 MG tablet Take 50 mg by mouth daily at 4 PM.     [provider]  Emollient Letitia Caul) LOTN Apply 1 application topically every 12 (twelve) hours as needed (dry skin). Apply to feet and ankles    [provider]  escitalopram (LEXAPRO) 10 MG tablet Take 10 mg by mouth daily. (0800)    [provider]  famotidine (PEPCID) 20 MG tablet Take 20 mg by mouth daily.    [provider]  fentaNYL (DURAGESIC) 25 MCG/HR Place 1 patch onto the skin every 3 (three) days.    [provider]  hydrocortisone cream (PREPARATION H) 1 % Apply 1 application topically every 6 (six) hours as needed (hemorrhoids).    [provider]  lamivudine (EPIVIR) 100 MG tablet Take 50 mg by mouth daily. (0800)    [provider]  Lidocaine HCl (ASPERCREME LIDOCAINE) 4 % CREA Apply 1 application topically every 12 (twelve) hours as needed (wrist pain).    [provider]  loperamide (IMODIUM) 2 MG capsule Take 2-4 mg by mouth as needed for diarrhea or loose stools.     [provider]  methadone (DOLOPHINE) 5 MG tablet Take 2.5 mg by mouth daily at 8 pm.  08/02/19   [provider]  Multiple Vitamin (MULTIVITAMIN WITH MINERALS) TABS tablet Take 1 tablet by mouth at bedtime. (2000)    [provider]  nitroGLYCERIN (NITROSTAT) 0.4 MG SL tablet Place 0.4 mg under the tongue every 5 (five) minutes x 3 doses as needed for chest pain.     [provider]  ondansetron (ZOFRAN-ODT) 4 MG disintegrating tablet Take 4 mg by mouth every 8 (eight) hours as needed for nausea  or vomiting.     [provider]  Oxycodone HCl 10 MG TABS Take 1 tablet (10 mg total) by mouth every 12 (twelve) hours as needed (severe pain). 07/22/19   Orson Eva, MD  polyethylene glycol-electrolytes (NULYTELY) 420 g solution As directed 12/04/19   Eloise Harman, DO  promethazine (PHENERGAN) 12.5 MG tablet Take 12.5 mg by mouth See admin instructions. Take 12.5 mg by mouth in the morning every Tuesday, Thursday and Saturday for nausea/vomiting and every 6 hours as needed for n/v    [provider]  saccharomyces boulardii (FLORASTOR) 250 MG capsule Take 250 mg by mouth 2 (two) times daily.    [provider]  vitamin C (ASCORBIC ACID) 500 MG tablet Take 500 mg by mouth 2 (two) times daily. (0900 & 2100)    [provider]  zidovudine (RETROVIR) 100 MG capsule Take 100 mg by mouth 3 (three) times daily. (0900, 1300, & 2100)    [provider]  Zinc 220 (50 Zn) MG CAPS Take 220 mg by mouth daily. (0800)    [provider]    Allergies    Lactose intolerance (gi), Latex, and Penicillins  Review of Systems   Review of Systems  Constitutional: Negative for fever.  HENT: Negative for ear pain.   Eyes: Negative for pain.  Respiratory: Negative for cough.   Cardiovascular: Negative for chest pain.  Gastrointestinal: Negative for abdominal pain.  Genitourinary: Negative for flank pain.  Musculoskeletal: Negative for back pain.  Skin: Negative for rash.  Neurological: Negative for headaches.    Physical Exam Updated Vital Signs BP (!) 164/95 (BP Location: Right Arm)   Pulse 76   Temp 99.1 F (37.3 C) (Oral)   Resp 17   Ht 4\' 10"  (1.473 m)   LMP  (LMP Unknown)   SpO2 97%   BMI 21.49 kg/m   Physical Exam Constitutional:      General: She is not in acute distress.    Appearance: Normal appearance.  HENT:     Head: Normocephalic.     Nose: Nose normal.  Eyes:     Extraocular Movements: Extraocular movements intact.   Cardiovascular:     Rate and Rhythm: Normal rate.  Pulmonary:     Effort: Pulmonary effort is normal.  Musculoskeletal:        General: Normal range of motion.     Cervical back: Normal range of motion.     Comments: Mild tenderness palpation of the right lateral rib region.  No laceration or abrasion noted.  Neurological:     General: No focal deficit present.     Mental Status: She is alert. Mental status is at baseline.     ED Results / Procedures / Treatments   Labs (all labs ordered are listed, but only abnormal results are displayed) Labs Reviewed - No data to display  EKG None  Radiology DG Ribs Unilateral W/Chest Right  Result Date: 04/06/2020 CLINICAL DATA:  [place], right anterior rib pain EXAM: RIGHT RIBS AND CHEST - 3+ VIEW COMPARISON:  07/19/2019 FINDINGS: Frontal view of the chest as well as frontal and oblique views of the right thoracic cage are obtained. Continued enlargement of the cardiac silhouette with marked distension of the central pulmonary vasculature. No acute airspace disease, effusion, or pneumothorax. Vascular stent in the region of the right subclavian artery. There are no acute displaced fractures. IMPRESSION: 1. No acute displaced fractures. 2. Stable cardiomegaly and pulmonary vascular congestion. Electronically Signed   By: Randa Ngo M.D.   On: 04/06/2020 19:00    Procedures Procedures   Medications Ordered in ED Medications  acetaminophen (TYLENOL) tablet 650 mg (650 mg Oral Given 04/06/20 1850)    ED  Course  I have reviewed the triage vital signs and the nursing notes.  Pertinent labs & imaging results that were available during my care of the patient were reviewed by me and considered in my medical decision making (see chart for details).    MDM Rules/Calculators/A&P                          Rib series x-ray shows no acute fracture dislocation or foreign body.  Patient given Tylenol for pain management.  Discharged back to her home  in stable condition.  Advised immediate return for worsening symptoms pain fevers or any additional concerns.   Final Clinical Impression(s) / ED Diagnoses Final diagnoses:  Rib pain    Rx / DC Orders ED Discharge Orders    None       Luna Fuse, MD 04/06/20 1911

## 2020-04-07 ENCOUNTER — Encounter (HOSPITAL_COMMUNITY): Payer: Self-pay | Admitting: Emergency Medicine

## 2020-04-07 ENCOUNTER — Emergency Department (HOSPITAL_COMMUNITY): Payer: Medicare Other

## 2020-04-07 ENCOUNTER — Other Ambulatory Visit: Payer: Self-pay

## 2020-04-07 ENCOUNTER — Emergency Department (HOSPITAL_COMMUNITY)
Admission: EM | Admit: 2020-04-07 | Discharge: 2020-04-07 | Disposition: A | Discharge status: Home / Self Care | Payer: Medicare Other | Attending: Emergency Medicine | Admitting: Emergency Medicine

## 2020-04-07 DIAGNOSIS — I132 Hypertensive heart and chronic kidney disease with heart failure and with stage 5 chronic kidney disease, or end stage renal disease: Secondary | ICD-10-CM | POA: Diagnosis not present

## 2020-04-07 DIAGNOSIS — Y92129 Unspecified place in nursing home as the place of occurrence of the external cause: Secondary | ICD-10-CM | POA: Diagnosis not present

## 2020-04-07 DIAGNOSIS — W19XXXA Unspecified fall, initial encounter: Secondary | ICD-10-CM

## 2020-04-07 DIAGNOSIS — I509 Heart failure, unspecified: Secondary | ICD-10-CM | POA: Insufficient documentation

## 2020-04-07 DIAGNOSIS — Z992 Dependence on renal dialysis: Secondary | ICD-10-CM | POA: Diagnosis not present

## 2020-04-07 DIAGNOSIS — Z87891 Personal history of nicotine dependence: Secondary | ICD-10-CM | POA: Diagnosis not present

## 2020-04-07 DIAGNOSIS — N186 End stage renal disease: Secondary | ICD-10-CM | POA: Diagnosis not present

## 2020-04-07 DIAGNOSIS — W1789XA Other fall from one level to another, initial encounter: Secondary | ICD-10-CM | POA: Diagnosis not present

## 2020-04-07 DIAGNOSIS — M25551 Pain in right hip: Secondary | ICD-10-CM | POA: Diagnosis not present

## 2020-04-07 DIAGNOSIS — Z21 Asymptomatic human immunodeficiency virus [HIV] infection status: Secondary | ICD-10-CM | POA: Diagnosis not present

## 2020-04-07 DIAGNOSIS — Z91040 Latex allergy status: Secondary | ICD-10-CM | POA: Diagnosis not present

## 2020-04-07 DIAGNOSIS — R531 Weakness: Secondary | ICD-10-CM | POA: Diagnosis not present

## 2020-04-07 DIAGNOSIS — M25552 Pain in left hip: Secondary | ICD-10-CM | POA: Diagnosis not present

## 2020-04-07 DIAGNOSIS — S0990XA Unspecified injury of head, initial encounter: Secondary | ICD-10-CM | POA: Diagnosis not present

## 2020-04-07 DIAGNOSIS — Z8673 Personal history of transient ischemic attack (TIA), and cerebral infarction without residual deficits: Secondary | ICD-10-CM | POA: Insufficient documentation

## 2020-04-07 IMAGING — CT CT HEAD W/O CM
3 series · 16 of 47 positions shown, 19 images · non-contrast
Comparison: [DATE]

CLINICAL DATA: Fall

EXAM:
CT HEAD WITHOUT CONTRAST
TECHNIQUE: Contiguous axial images were obtained from the base of the skull
through the vertex without intravenous contrast.

[Series 2: head w o · axial · 0.41mm/px · z∈[+65,+195]mm · 10 of 32 slices shown, 13 images]
[im 3/32  brain]
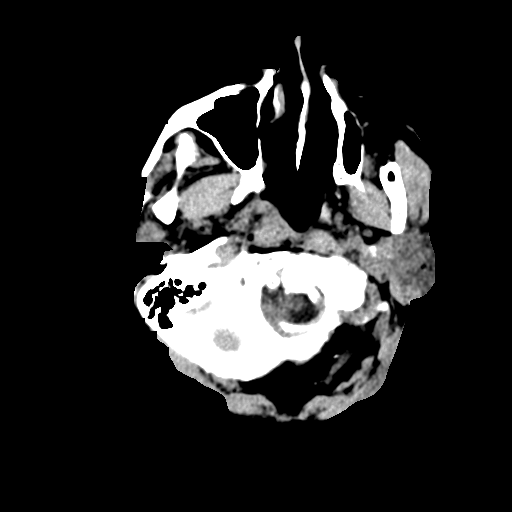
[im 3/32  bone]
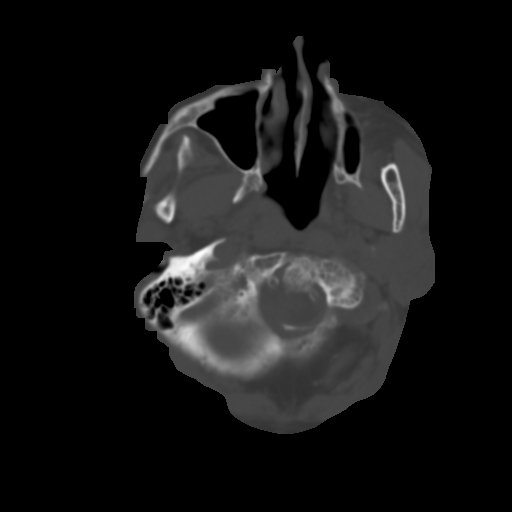
[im 6/32  brain]
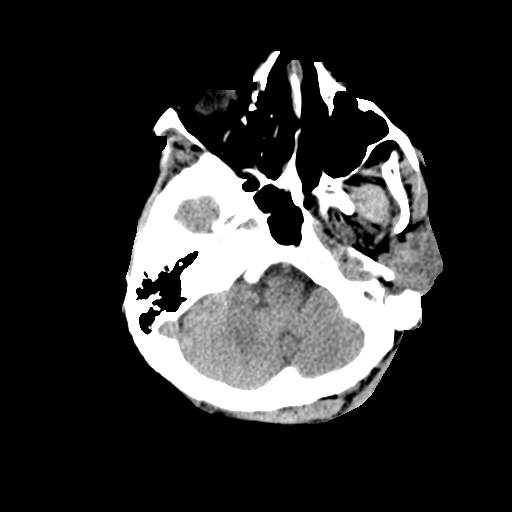
[im 9/32  brain]
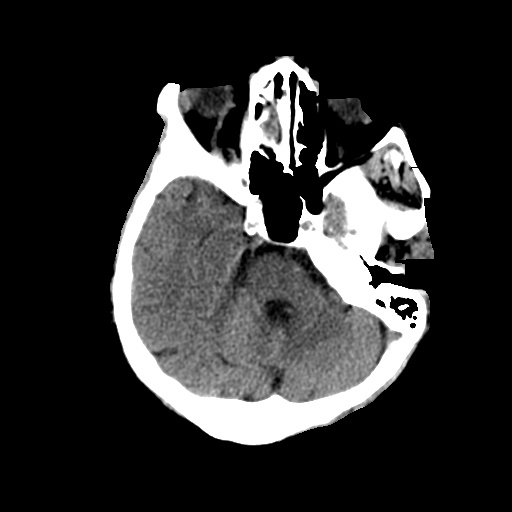
[im 11/32  brain]
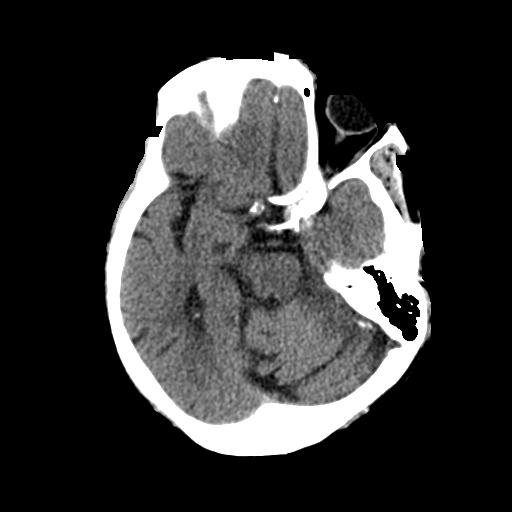
[im 14/32  brain]
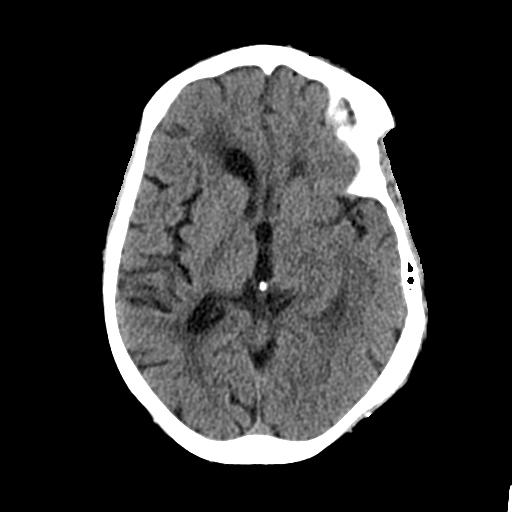
[im 14/32  bone]
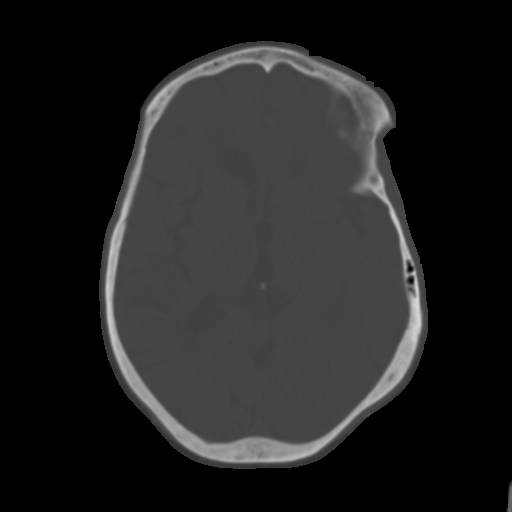
[im 18/32  brain]
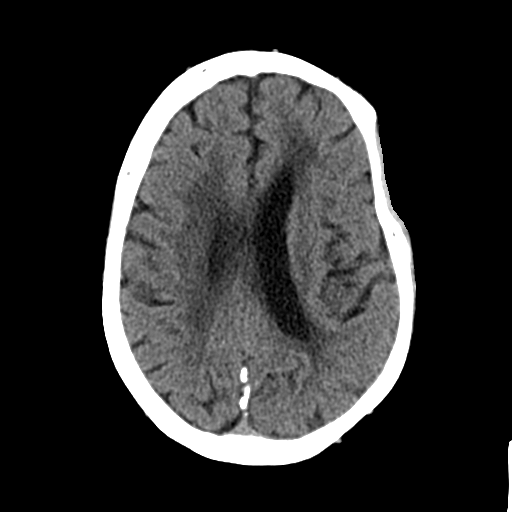
[im 21/32  brain]
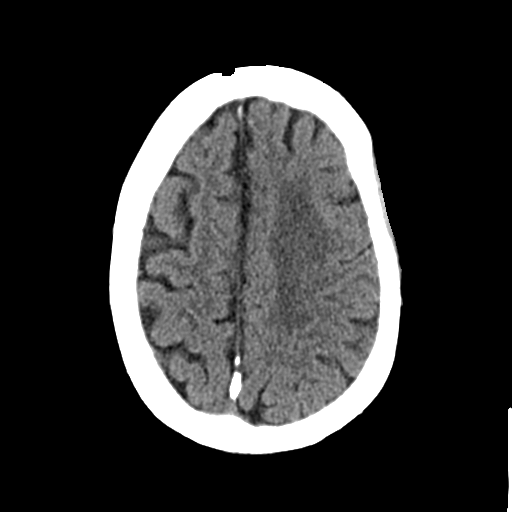
[im 24/32  brain]
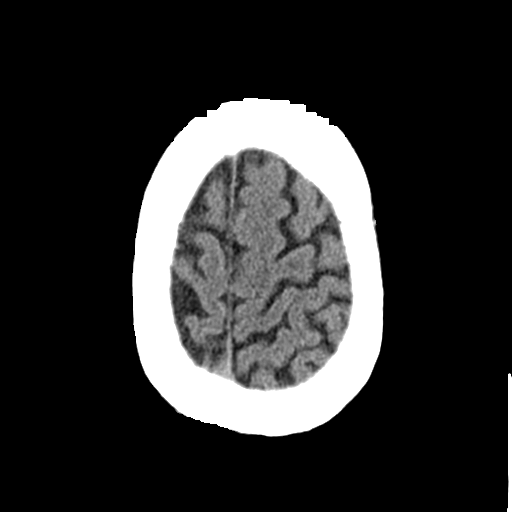
[im 26/32  brain]
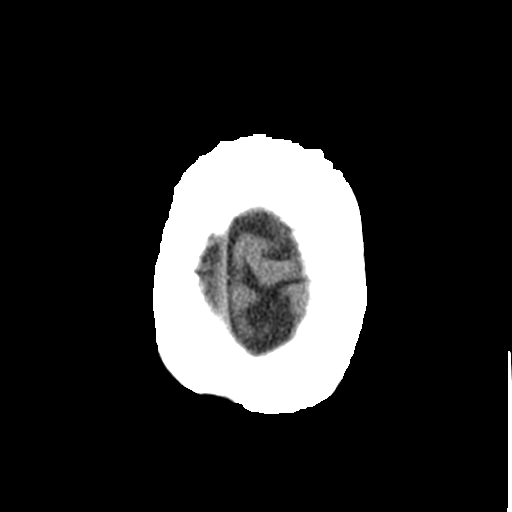
[im 26/32  bone]
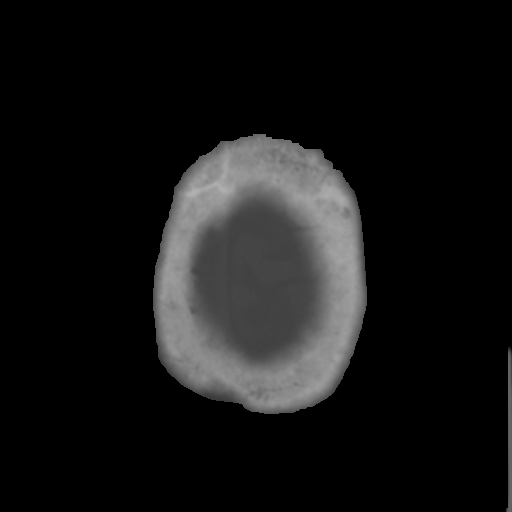
[im 29/32  brain]
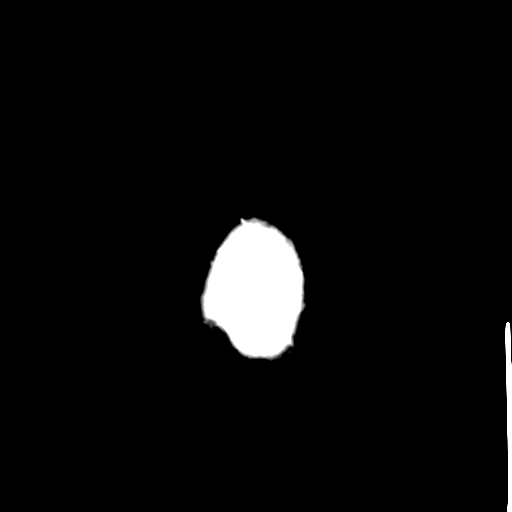

[Series 4: coronal soft · coronal · 0.30mm/px · 3 of 64 slices shown]
[im 22/64  brain]
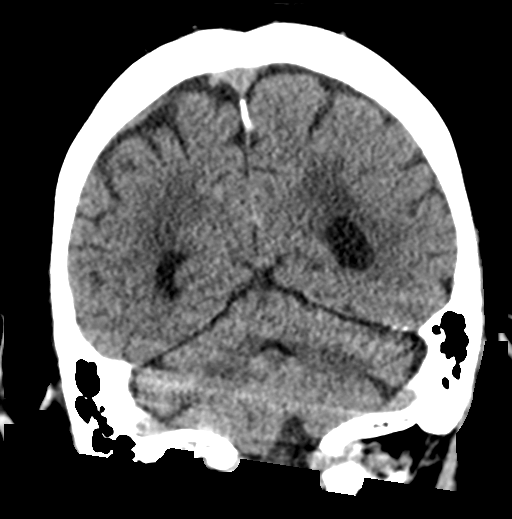
[im 29/64  brain]
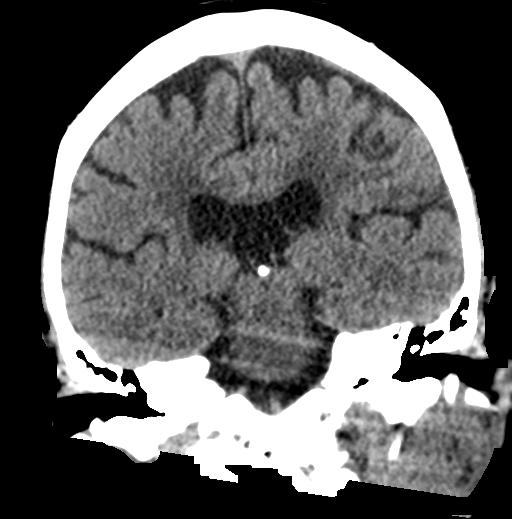
[im 36/64  brain]
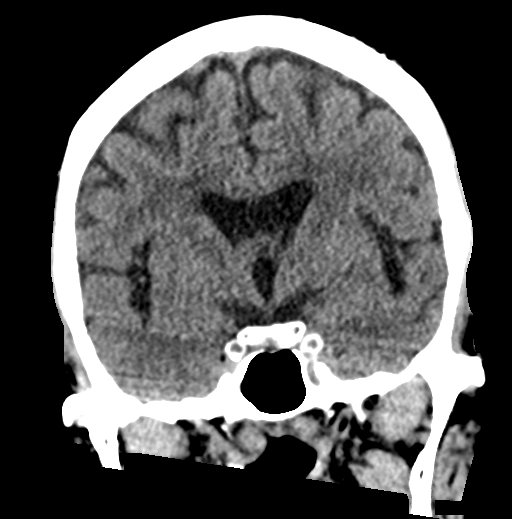

[Series 5: sagittal soft · sagittal · 0.32mm/px · 3 of 51 slices shown]
[im 18/51  brain]
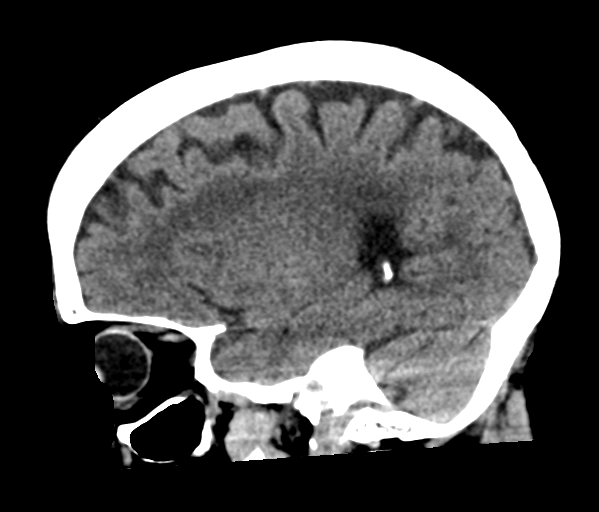
[im 25/51  brain]
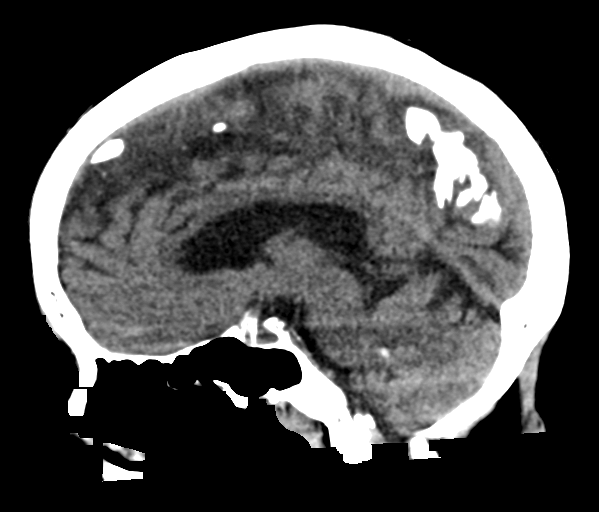
[im 32/51  brain]
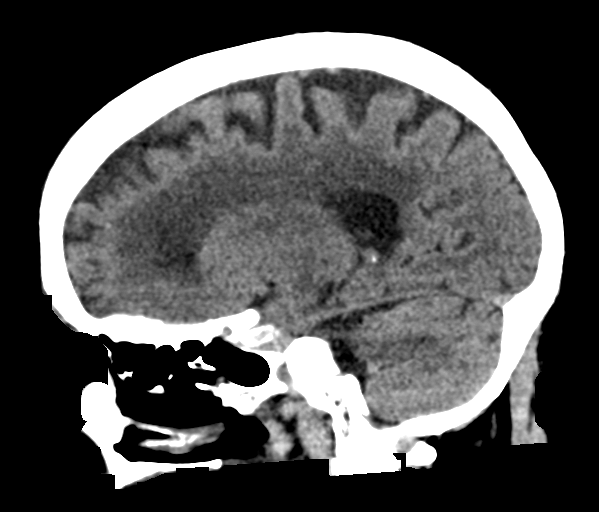

[16 of 47 positions shown; findings below may reference images not displayed]

FINDINGS: Brain: No evidence of acute infarction, hemorrhage, hydrocephalus,
extra-axial collection or mass lesion/mass effect. Periventricular
white matter hypodensities consistent with sequela of chronic
microvascular ischemic disease. Global parenchymal volume loss,
advanced for age.

Vascular: Vascular calcifications.

Skull: No acute fracture. Heterogeneous appearance of the osseous
structures consistent with renal osteodystrophy

Sinuses/Orbits: No acute finding.

Other: None.
IMPRESSION: 1. No acute intracranial abnormality.

## 2020-04-07 IMAGING — CT CT CERVICAL SPINE W/O CM
3 of 4 series · 12 of 33 positions shown, 14 images · non-contrast
Comparison: The [DATE] [DATE], [DATE], [DATE] [DATE], [DATE]

CLINICAL DATA: Fall

EXAM:
CT CERVICAL SPINE WITHOUT CONTRAST
TECHNIQUE: Multidetector CT imaging of the cervical spine was performed without
intravenous contrast. Multiplanar CT image reconstructions were also
generated.

[Series 5: sagittal bone · sagittal · 0.23mm/px · 5 of 45 slices shown, 6 images]
[im 15/45  bone]
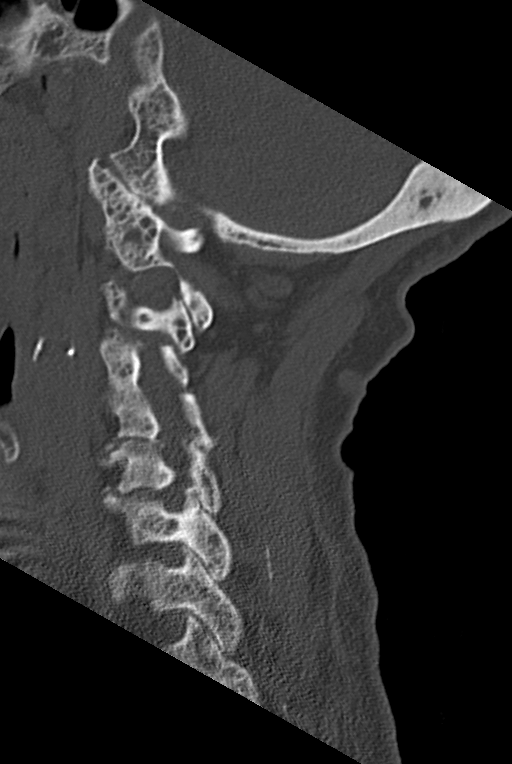
[im 19/45  bone]
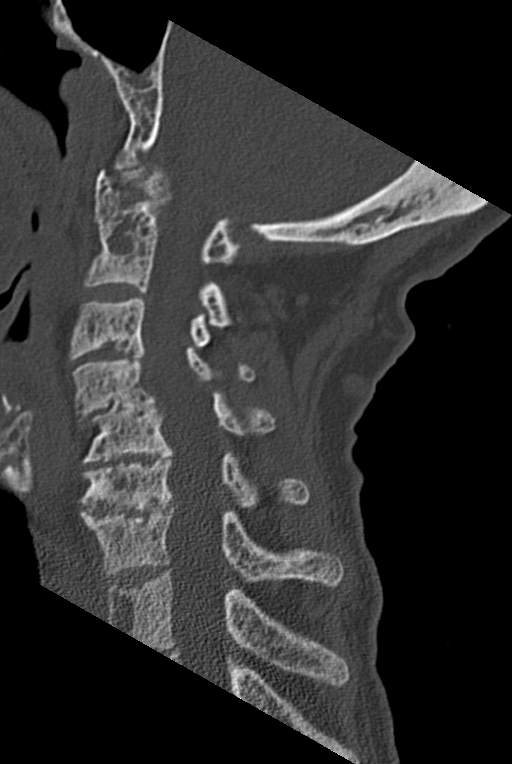
[im 23/45  soft-tissue]
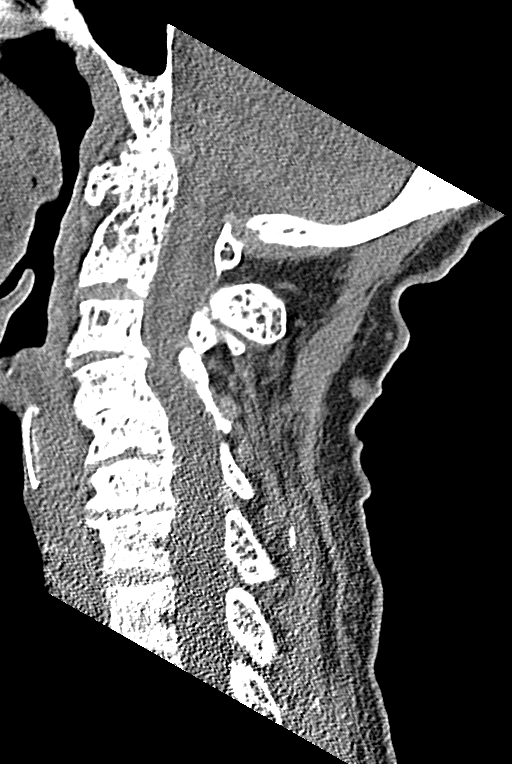
[im 23/45  bone]
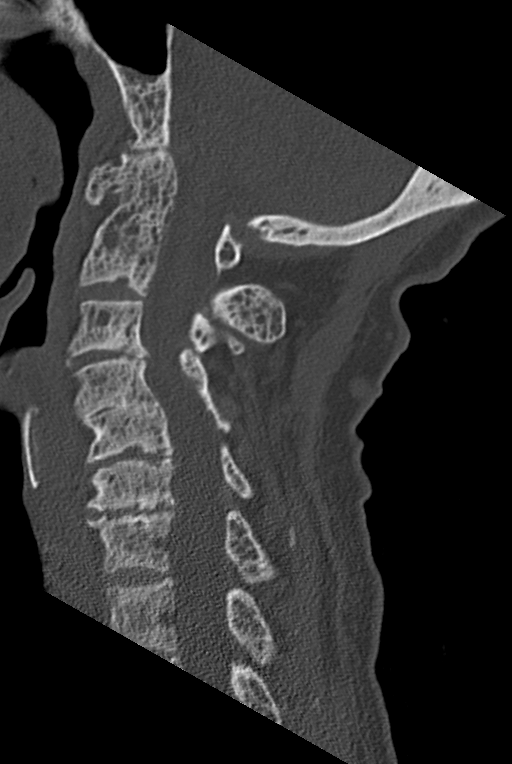
[im 26/45  bone]
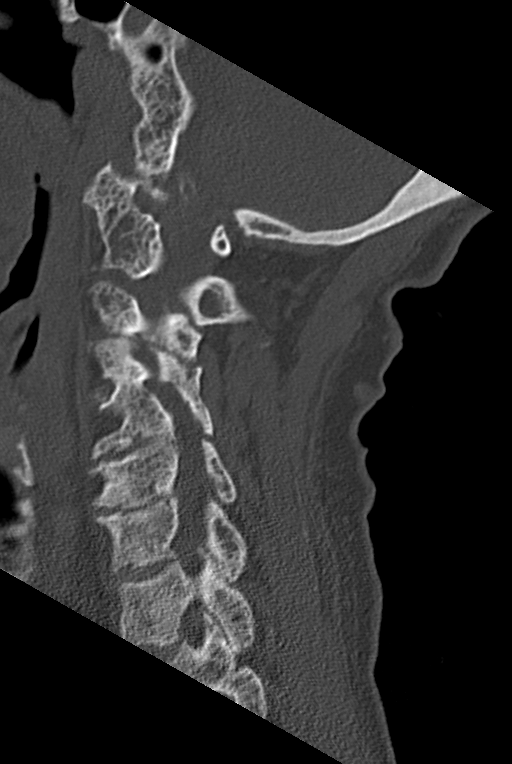
[im 30/45  bone]
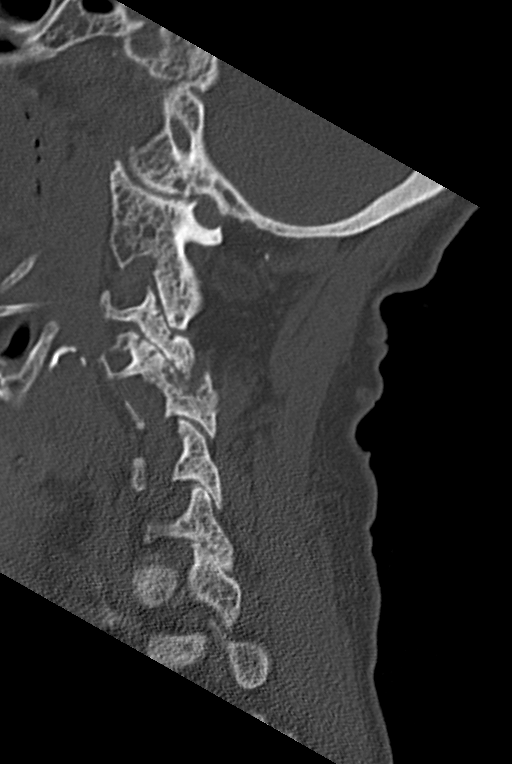

[Series 6: coronal bone · coronal · 0.23mm/px · 3 of 56 slices shown]
[im 13/56  bone]
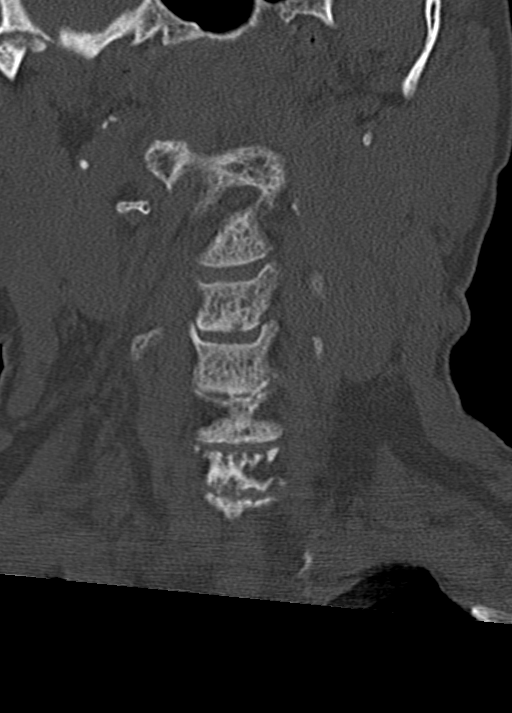
[im 23/56  bone]
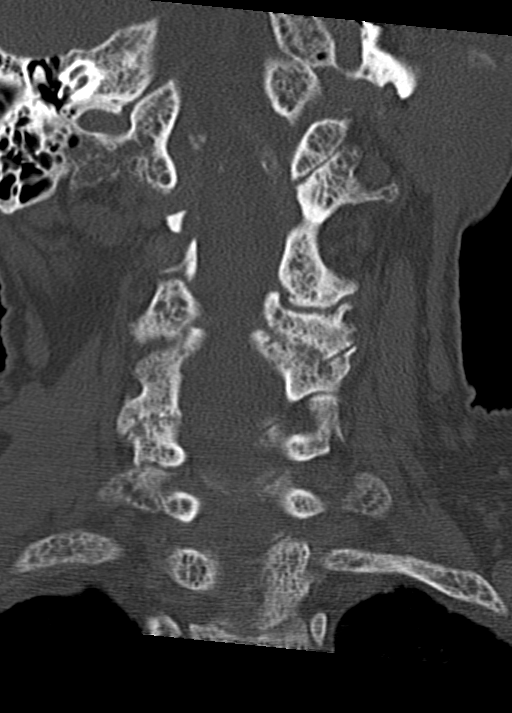
[im 33/56  bone]
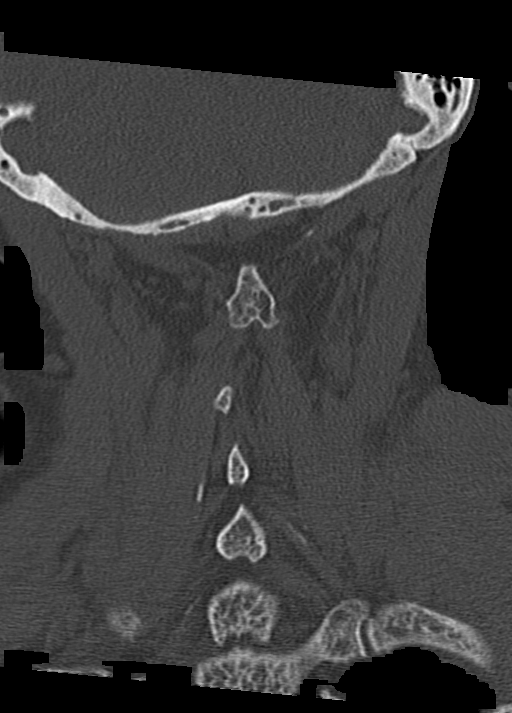

[Series 7: orthogonal axials · axial · 0.21mm/px · z∈[-76,+29]mm · 4 of 90 slices shown, 5 images]
[im 13/90  soft-tissue]
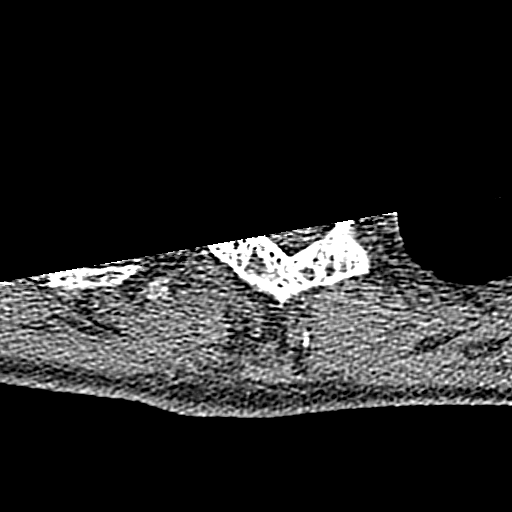
[im 13/90  bone]
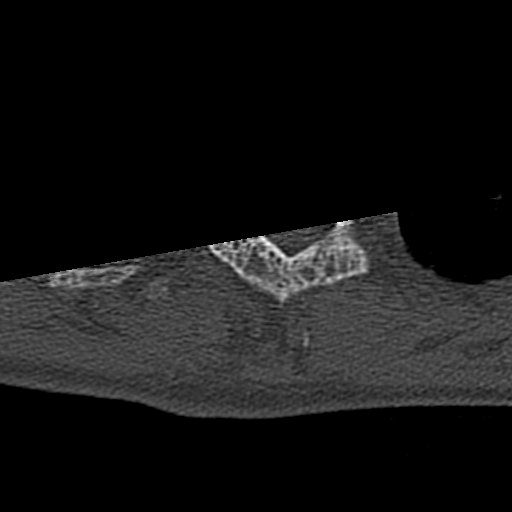
[im 39/90  bone]
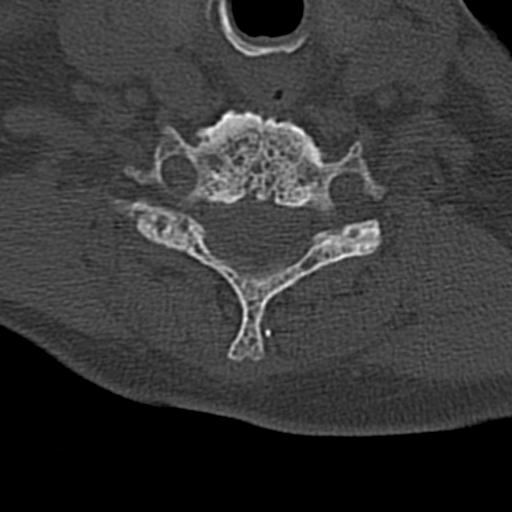
[im 51/90  bone]
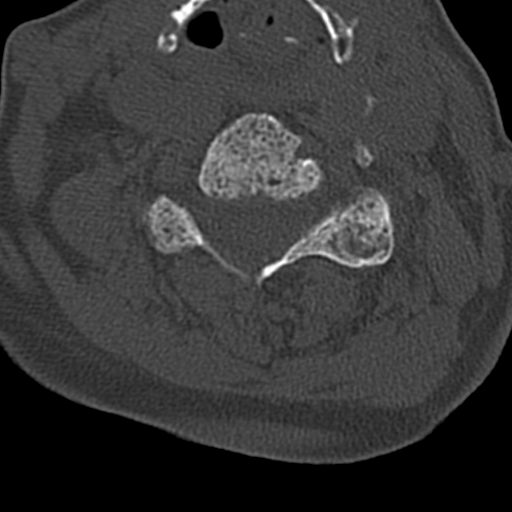
[im 77/90  bone]
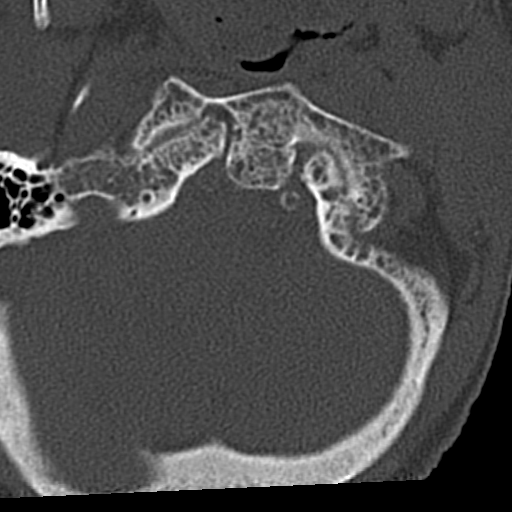

[12 of 33 positions shown; findings below may reference images not displayed]

FINDINGS: Alignment: Unchanged alignment of the spine with reversal of the
cervical lordosis centered at the C4-5 with corresponding partial
osseous ankylosis at this level. No acute spondylolisthesis.

Skull base and vertebrae: Sclerotic and permeative heterogeneous
appearance of the osseous structures likely reflecting underlying
renal osteodystrophy. This is similar in comparison to prior. No
definitive acute fracture. Partial osseous ankylosis of C4-5.
Partial osseous ankylosis of the dens and C1. This is unchanged in
comparison to prior.

Soft tissues and spinal canal: No prevertebral fluid or swelling. No
visible canal hematoma.

Disc levels: Loss of intervertebral disc space at C4-5. Severe
intervertebral disc space height loss of C5-6 and C6-7. Multilevel
facet arthropathy most pronounced at LEFT C3-4, unchanged in
comparison to prior.

Upper chest: Negative.

Other: Atherosclerotic calcifications of bilateral carotid arteries.
Coarse calcification of the LEFT thyroid. Subcentimeter thyroid
nodules.
IMPRESSION: 1. No definitive acute fracture or traumatic listhesis of the
cervical spine.
2. Given multilevel partial osseous ankylosis, if there is
persistent concern for ligamentous injury, recommend dedicated MRI.

## 2020-04-07 IMAGING — DX DG HIP (WITH OR WITHOUT PELVIS) 3-4V BILAT
5 series · 5 of 5 positions shown · non-contrast
Comparison: [DATE] [DATE], [DATE], [DATE] [DATE], [DATE]

CLINICAL DATA: Fall

EXAM:
DG HIP (WITH OR WITHOUT PELVIS) 3-4V BILAT

[pelvis ap]
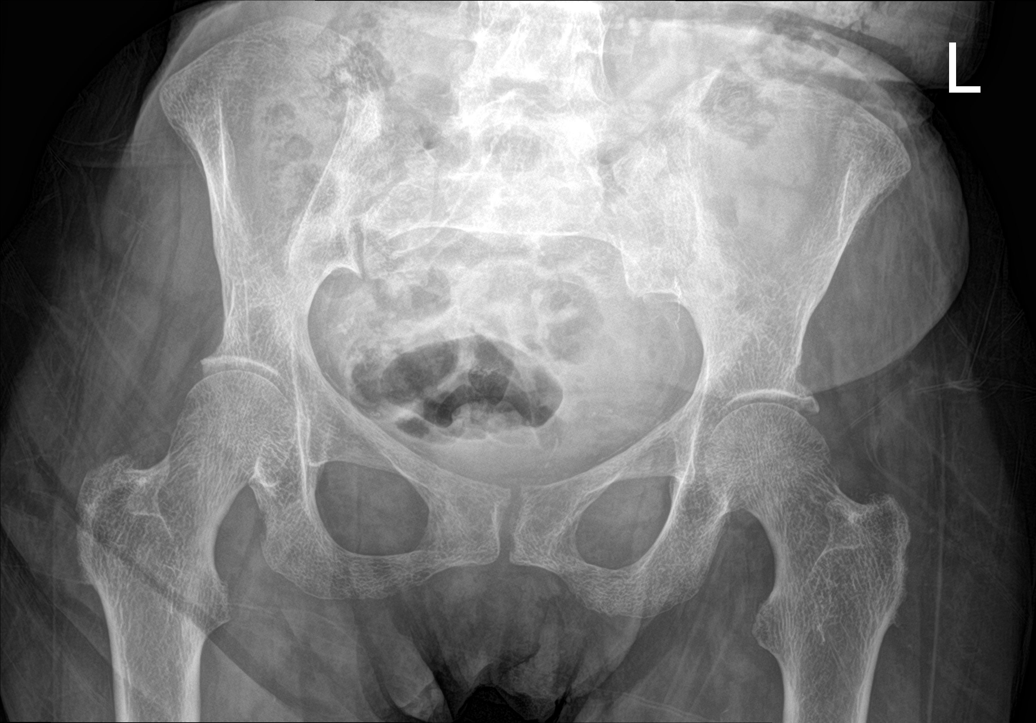

[hip ap (1 of 2)]
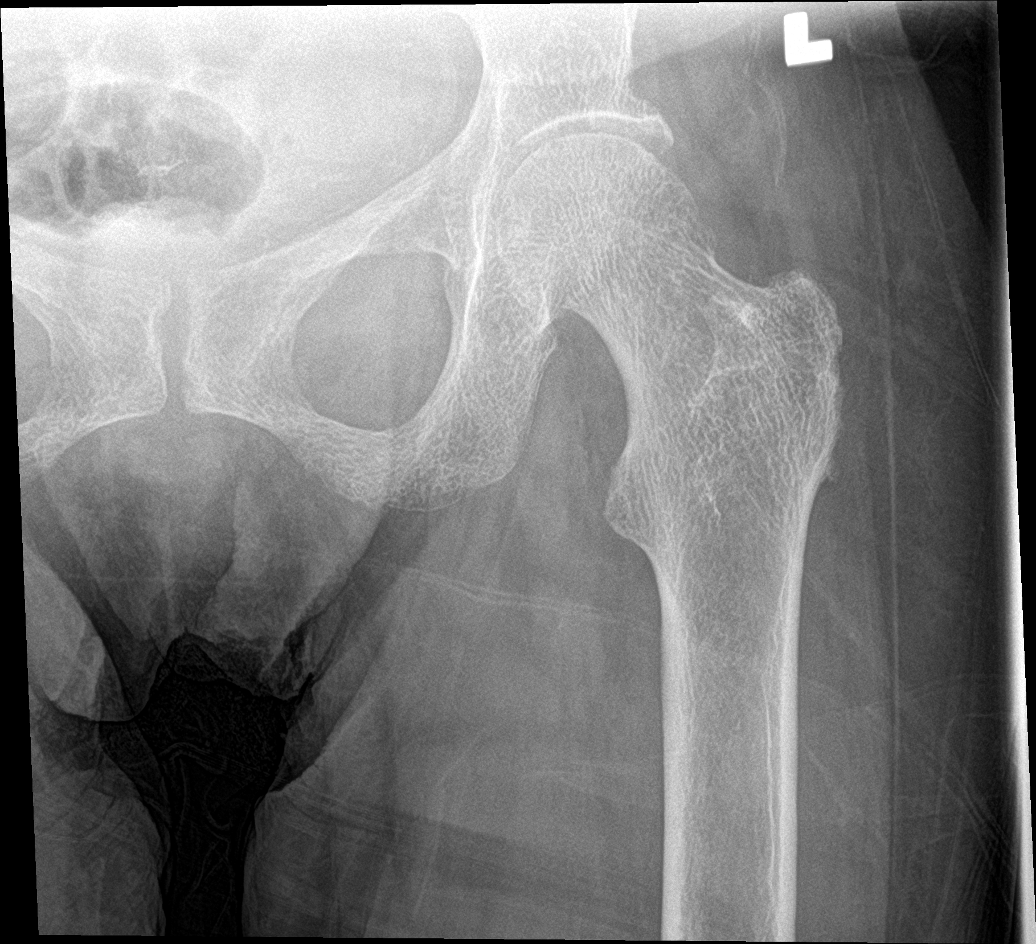

[hip lat (1 of 2)]
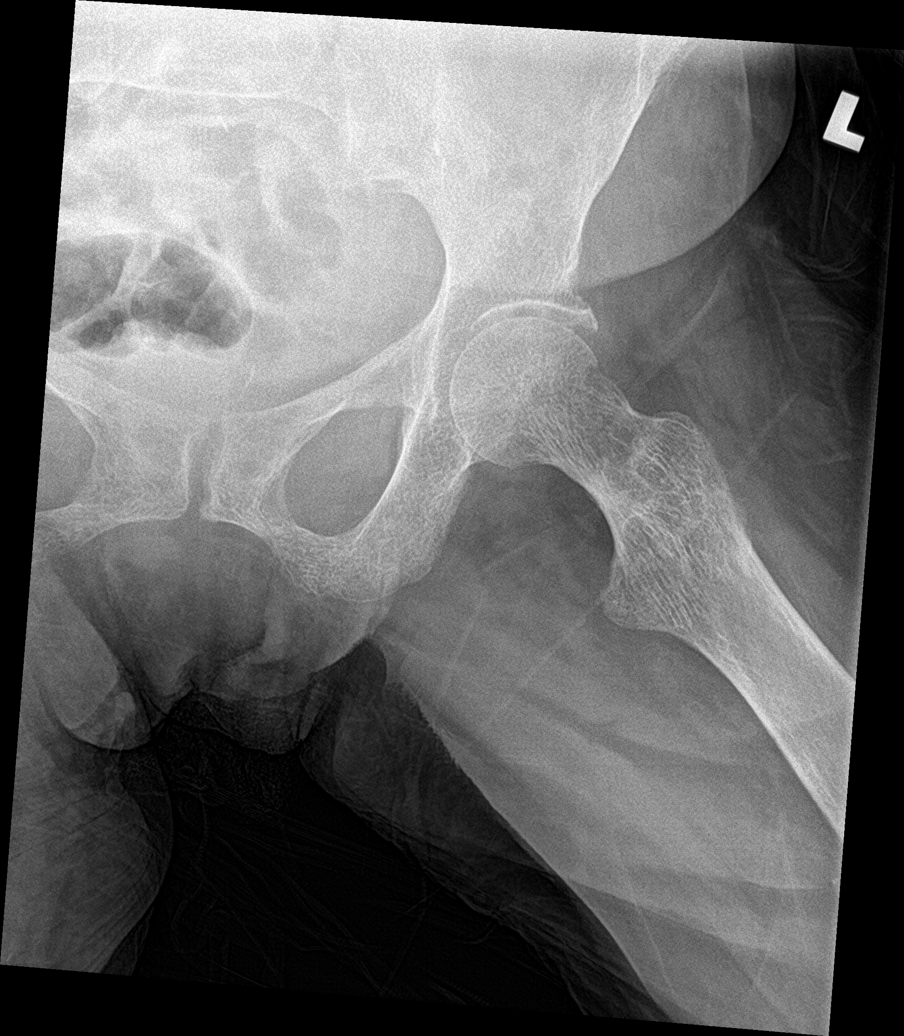

[hip ap (2 of 2)]
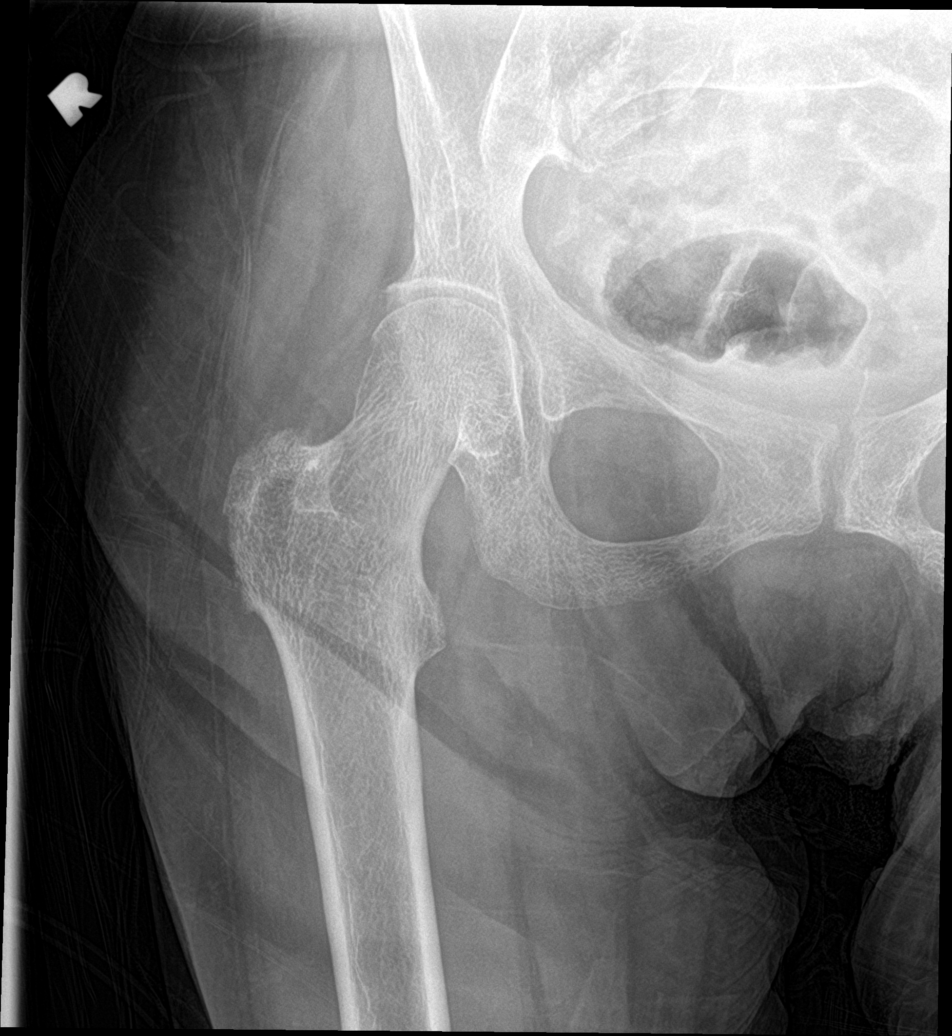

[hip lat (2 of 2)]
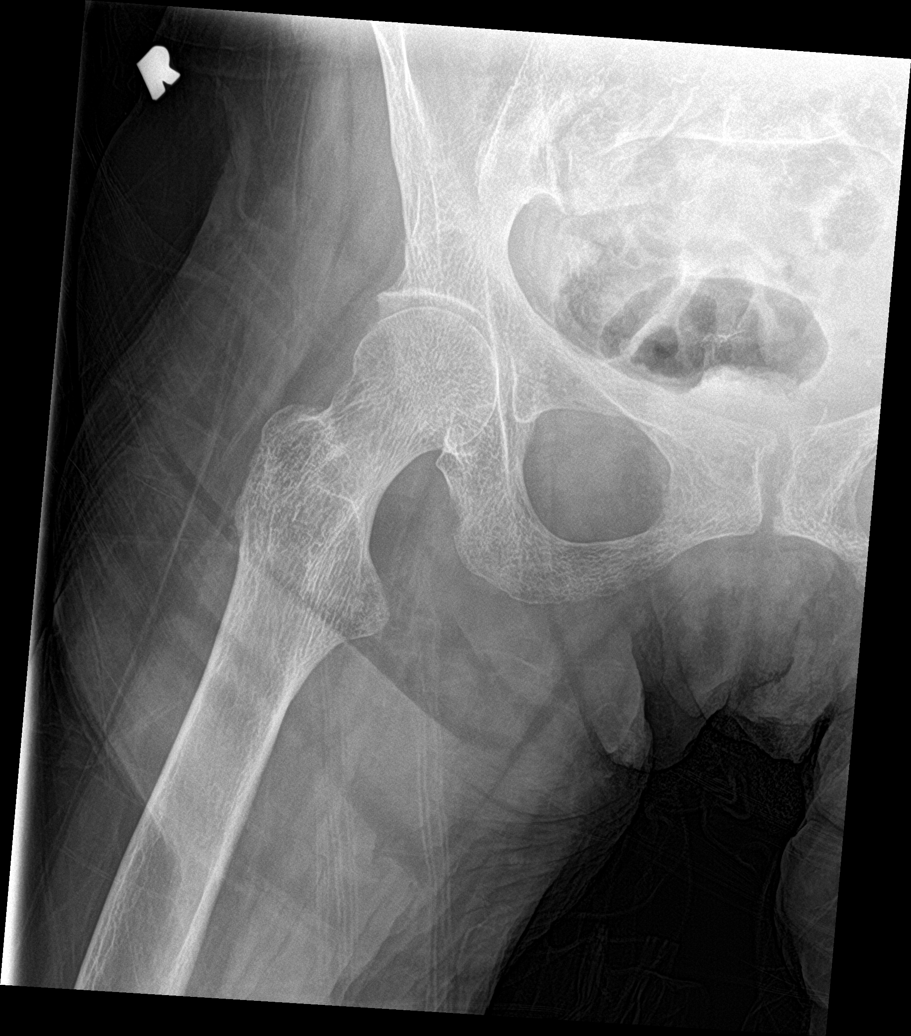

[5 of 5 positions shown; findings below may reference images not displayed]

FINDINGS: Osteopenia. No acute fracture or dislocation. Mild bilateral
degenerative changes of the hips with unchanged undulating contour
of the femoral head and neck junctions. Evaluation of the sacrum is
limited secondary to overlapping bowel gas. Vascular calcifications.
No area of erosion or osseous destruction. No unexpected radiopaque
foreign body. Soft tissues are unremarkable.
IMPRESSION: 1. Osteopenia. No acute fracture or dislocation.
2. If persistent concern for nondisplaced hip or pelvic fracture,
recommend dedicated pelvic MRI.

## 2020-04-07 NOTE — ED Triage Notes (Signed)
Pt arrives from [name] via [employer]. Pt seen yesterday for a fall from the transport van to the ground while sitting in the wheelchair. Pt reports that today her hips and head are hurting. Per EMS pt was ambulatory to the stretcher.

## 2020-04-07 NOTE — Discharge Instructions (Addendum)
Work-up for the fall here today without any acute findings.  Yesterday she had negative chest x-ray and rib series.  Today the x-rays of pelvis and both hips without any bony abnormalities.  Also did CT head and CT neck since she hit the back of her head when she fell out of the [place] and those are negative as well.  Patient stable for discharge back to [facility].

## 2020-04-07 NOTE — ED Provider Notes (Signed)
[facility] Provider Note   CSN: [id number] Arrival date & time: 04/07/20  1113     History Chief Complaint  Patient presents with  . Fall    [name] is a 70 y.o. female.  Patient was evaluated yesterday following a fall. Sent back in from the nursing facility [facility] as it sounds as if maybe there was other concerns for pain. That were not addressed or voiced yesterday. Apparently patient fell out of the back of the [place] in a wheelchair. States she had a back of her head she complaining of head pain she is also complaining of bilateral hip pain. So yesterday they did chest x-ray with rib series and had no acute injuries. Patient for the most part is wheelchair-bound. She does not ambulate on her own.        Past Medical History:  Diagnosis Date  . Anemia   . Anxiety   . Atrial flutter ([place]) 02/22/2015  . C. difficile colitis 10/10/11  . Chronic diarrhea   . Chronic pain   . Depression   . ESRD on hemodialysis [facility])    Dialysis [facility]  . GERD (gastroesophageal reflux disease)   . HIV (human immunodeficiency virus infection) ([place])   . Hypertension   . Insomnia   . Intracranial hemorrhage ([place])   . Muscle weakness (generalized)   . Pulmonary HTN ([place])   . Tachycardia   . TIA (transient ischemic attack)    [facility] do not have this dx  . Traumatic hematoma of right upper arm 02/22/2015    Patient Active Problem List   Diagnosis Date Noted  . Common bile duct dilation   . Lobar pneumonia ([place]) 07/20/2019  . Acute respiratory failure with hypoxia ([place]) 07/20/2019  . Colitis 07/19/2019  . Dilation of common bile duct 07/19/2019  . Ventral hernia without obstruction or gangrene   . Hypocalcemia 03/07/2019  . Other pancytopenia ([place]) 12/08/2018  . Anemia of chronic disease 04/17/2016  . Hepatitis B immune 01/28/2016  . Atrial flutter ([place]) 02/22/2015  . Hyperkalemia 02/20/2015  . Venous stenosis of right  upper extremity 04/06/2014  . ESRD on dialysis ([place]) 04/06/2014  . Essential hypertension   . Nausea with vomiting 12/15/2012  . CHF, chronic ([place]) 11/25/2012  . Atrial fibrillation ([place]) 05/02/2012  . Intracranial bleed ([place]) 12/28/2011  . Angioedema of lips 09/21/2011  . Cellulitis of breast 03/09/2011  . Erosive esophagitis 12/15/2010  . Mallory - Weiss tear 12/15/2010  . UGI bleed 12/13/2010  . DIARRHEA 04/12/2009  . Human immunodeficiency virus (HIV) disease ([place]) 11/15/2008  . PANCREATITIS, HX OF 11/15/2008  . TOBACCO USE, QUIT 11/15/2008  . PAIN IN JOINT, UPPER ARM 08/07/2008  . ABDOMINAL WALL HERNIA 04/26/2007  . BRONCHITIS, ACUTE 11/16/2006  . HYPOKALEMIA, HX OF 07/13/2006  . DEPRESSION 03/23/2006  . Essential hypertension, malignant 03/23/2006  . GERD 03/23/2006  . PANCREATITIS 03/23/2006  . MASS, RIGHT AXILLA 03/23/2006  . HEADACHE 03/23/2006  . SYMPTOM, ENLARGEMENT, LYMPH NODES 03/23/2006  . SHINGLES, HX OF 03/23/2006  . HYSTERECTOMY, TOTAL, HX OF 03/23/2006    Past Surgical History:  Procedure Laterality Date  . A/V FISTULAGRAM N/A 04/17/2016   Procedure: A/V Fistulagram - Right Upper;  Surgeon: Waynetta Sandy, MD;  Location: [facility];  Service: Cardiovascular;  Laterality: N/A;  . A/V FISTULAGRAM Right 08/28/2019   Procedure: A/V FISTULAGRAM;  Surgeon: Waynetta Sandy, MD;  Location: [place] [facility];  Service: Cardiovascular;  Laterality: Right;  . ABDOMINAL HYSTERECTOMY    . BIOPSY  01/02/2020   Procedure: BIOPSY;  Surgeon: Eloise Harman, DO;  Location[facility];  Service: Endoscopy;;  . BIOPSY THYROID    . CARPAL TUNNEL RELEASE Right 11/16/2019   Procedure: CARPAL TUNNEL RELEASE;  Surgeon: Leanora Cover, MD;  Location: [place];  Service: Orthopedics;  Laterality: Right;  . COLONOSCOPY WITH PROPOFOL N/A 01/02/2020   Procedure: COLONOSCOPY WITH PROPOFOL;  Surgeon: Eloise Harman, DO;  [facility];  Service:  Endoscopy;  Laterality: N/A;  1:15pm-pt at [facility]  . Dialysis Shunts     previous one removed from left arm and now present one in right arm  . DIALYSIS/PERMA CATHETER INSERTION Right 04/17/2016   Procedure: dialysis Catheter Insertion central veinous;  Surgeon: Waynetta Sandy, MD;  Location: [place] [facility];  Service: Cardiovascular;  Laterality: Right;  . ESOPHAGOGASTRODUODENOSCOPY  12/15/2010   [place]: erosive reflux esophagitis, MW tear, hiatal hernia (small), gastritis with no h.pylori, possibly nsaid related  . EXCISION OF BREAST BIOPSY Right 05/04/2012   Procedure: EXCISION OF BREAST BIOPSY;  Surgeon: Donato Heinz, MD;  Location: [facility];  Service: General;  Laterality: Right;  Right Excisional Breast Biopsy  . FISTULOGRAM Right 03/26/2014   Procedure: Right Arm Fistulogram with Venoplasty Right Subclavian Vein and Inominate Vein. Debridement Fistula Ulcer;  Surgeon: Conrad Finzel, MD;  Location: [place];  Service: Vascular;  Laterality: Right;  . FISTULOGRAM Right 03/29/2017   Procedure: FISTULOGRAM COMPLEX RIGHT ARM with Balloon angioplasty;  Surgeon: Waynetta Sandy, MD;  Location: [place];  Service: Vascular;  Laterality: Right;  . IR GENERIC HISTORICAL  05/19/2016   IR REMOVAL TUN CV CATH W/O FL 05/19/2016 Markus Daft, MD [facility]-INTERV RAD  . PERIPHERAL VASCULAR BALLOON ANGIOPLASTY Right 04/17/2016   Procedure: Peripheral Vascular Balloon Angioplasty;  Surgeon: Waynetta Sandy, MD;  Location: [place] [facility];  Service: Cardiovascular;  Laterality: Right;  Central segment AV Fistula  . PERIPHERAL VASCULAR BALLOON ANGIOPLASTY Right 03/14/2018   Procedure: PERIPHERAL VASCULAR BALLOON ANGIOPLASTY;  Surgeon: Waynetta Sandy, MD;  Location: [place] [facility];  Service: Cardiovascular;  Laterality: Right;  subclavian vein  . PERIPHERAL VASCULAR BALLOON ANGIOPLASTY Right 08/28/2019   Procedure: PERIPHERAL VASCULAR BALLOON ANGIOPLASTY;  Surgeon: Waynetta Sandy, MD;  Location: [place] [facility];  Service: Cardiovascular;  Laterality: Right;  upper arm  . PERIPHERAL VASCULAR INTERVENTION Right 03/14/2018   Procedure: PERIPHERAL VASCULAR INTERVENTION;  Surgeon: Waynetta Sandy, MD;  Location: [place] [facility];  Service: Cardiovascular;  Laterality: Right;  subclavian vein  . SHUNTOGRAM Right 10/19/2013   Procedure: FISTULOGRAM;  Surgeon: Conrad Wawona, MD;  Location[facility];  Service: Cardiovascular;  Laterality: Right;  . TONSILLECTOMY    . VISCERAL VENOGRAPHY Right 03/14/2018   Procedure: CENTRAL VENOGRAPHY;  Surgeon: Waynetta Sandy, MD;  Location: [place] [facility];  Service: Cardiovascular;  Laterality: Right;  upper ext fistula     OB History    Gravida  1   Para  1   Term  1   Preterm      AB      Living  1     SAB      IAB      Ectopic      Multiple      Live Births  Family History  Problem Relation Age of Onset  . Anesthesia problems Neg Hx   . Hypotension Neg Hx   . Malignant hyperthermia Neg Hx   . Pseudochol deficiency Neg Hx   . Colon cancer Neg Hx        Unsure of parents who both died when she was a baby    Social History   Tobacco Use  . Smoking status: Former Smoker    Types: Cigarettes    Quit date: 03/12/1983    Years since quitting: 37.0  . Smokeless tobacco: Never Used  Vaping Use  . Vaping Use: Never used  Substance Use Topics  . Alcohol use: No    Alcohol/week: 0.0 standard drinks  . Drug use: No    Home Medications Prior to Admission medications   Medication Sig Start Date End Date Taking? Authorizing Provider  Biotin 5000 MCG CAPS Take 5,000 mcg by mouth daily. (0800)    [provider]  calcium acetate, Phos Binder, (PHOSLYRA) 667 MG/5ML SOLN Take 2,001 mg by mouth 3 (three) times daily with meals. (0800, 1200, & 2000)    [provider]  calcium elemental as carbonate (BARIATRIC TUMS ULTRA) 400 MG chewable  tablet Chew 1,000 mg by mouth 2 (two) times daily.    [provider]  clopidogrel (PLAVIX) 75 MG tablet Take 1 tablet (75 mg total) by mouth daily. Patient taking differently: Take 75 mg by mouth daily. (0800) 03/29/17   Waynetta Sandy, MD  cyanocobalamin (,VITAMIN B-12,) 1000 MCG/ML injection Inject 1 mL (1,000 mcg total) into the muscle every 28 (twenty-eight) days. 10/25/19   Derek Jack, MD  dolutegravir (TIVICAY) 50 MG tablet Take 50 mg by mouth daily at 4 PM.     [provider]  Emollient Letitia Caul) LOTN Apply 1 application topically every 12 (twelve) hours as needed (dry skin). Apply to feet and ankles    [provider]  escitalopram (LEXAPRO) 10 MG tablet Take 10 mg by mouth daily. (0800)    [provider]  famotidine (PEPCID) 20 MG tablet Take 20 mg by mouth daily.    [provider]  fentaNYL (DURAGESIC) 25 MCG/HR Place 1 patch onto the skin every 3 (three) days.    [provider]  hydrocortisone cream (PREPARATION H) 1 % Apply 1 application topically every 6 (six) hours as needed (hemorrhoids).    [provider]  lamivudine (EPIVIR) 100 MG tablet Take 50 mg by mouth daily. (0800)    [provider]  Lidocaine HCl (ASPERCREME LIDOCAINE) 4 % CREA Apply 1 application topically every 12 (twelve) hours as needed (wrist pain).    [provider]  loperamide (IMODIUM) 2 MG capsule Take 2-4 mg by mouth as needed for diarrhea or loose stools.     [provider]  methadone (DOLOPHINE) 5 MG tablet Take 2.5 mg by mouth daily at 8 pm.  08/02/19   [provider]  Multiple Vitamin (MULTIVITAMIN WITH MINERALS) TABS tablet Take 1 tablet by mouth at bedtime. (2000)    [provider]  nitroGLYCERIN (NITROSTAT) 0.4 MG SL tablet Place 0.4 mg under the tongue every 5 (five) minutes x 3 doses as needed for chest pain.     [provider]  ondansetron (ZOFRAN-ODT) 4 MG  disintegrating tablet Take 4 mg by mouth every 8 (eight) hours as needed for nausea or vomiting.     [provider]  Oxycodone HCl 10 MG TABS Take 1 tablet (10 mg  total) by mouth every 12 (twelve) hours as needed (severe pain). 07/22/19   Orson Eva, MD  polyethylene glycol-electrolytes (NULYTELY) 420 g solution As directed 12/04/19   Eloise Harman, DO  promethazine (PHENERGAN) 12.5 MG tablet Take 12.5 mg by mouth See admin instructions. Take 12.5 mg by mouth in the morning every Tuesday, Thursday and Saturday for nausea/vomiting and every 6 hours as needed for n/v    [provider]  saccharomyces boulardii (FLORASTOR) 250 MG capsule Take 250 mg by mouth 2 (two) times daily.    [provider]  vitamin C (ASCORBIC ACID) 500 MG tablet Take 500 mg by mouth 2 (two) times daily. (0900 & 2100)    [provider]  zidovudine (RETROVIR) 100 MG capsule Take 100 mg by mouth 3 (three) times daily. (0900, 1300, & 2100)    [provider]  Zinc 220 (50 Zn) MG CAPS Take 220 mg by mouth daily. (0800)    [provider]    Allergies    Lactose intolerance (gi), Latex, and Penicillins  Review of Systems   Review of Systems  Constitutional: Negative for chills and fever.  HENT: Negative for rhinorrhea and sore throat.   Eyes: Negative for visual disturbance.  Respiratory: Negative for cough and shortness of breath.   Cardiovascular: Negative for chest pain and leg swelling.  Gastrointestinal: Negative for abdominal pain, diarrhea, nausea and vomiting.  Genitourinary: Negative for dysuria.  Musculoskeletal: Positive for neck pain. Negative for back pain.  Skin: Negative for rash.  Neurological: Positive for headaches. Negative for dizziness and light-headedness.  Hematological: Does not bruise/bleed easily.  Psychiatric/Behavioral: Negative for confusion.    Physical Exam Updated Vital Signs BP 138/74   Pulse 65   Temp 98.5 F (36.9 C) (Oral)    Resp 18   LMP  (LMP Unknown)   SpO2 93%   Physical Exam Vitals and nursing note reviewed.  Constitutional:      General: She is not in acute distress.    Appearance: Normal appearance. She is well-developed and well-nourished.  HENT:     Head: Normocephalic and atraumatic.  Eyes:     Extraocular Movements: Extraocular movements intact.     Conjunctiva/sclera: Conjunctivae normal.     Pupils: Pupils are equal, round, and reactive to light.  Cardiovascular:     Rate and Rhythm: Normal rate and regular rhythm.     Heart sounds: No murmur heard.   Pulmonary:     Effort: Pulmonary effort is normal. No respiratory distress.     Breath sounds: Normal breath sounds.  Abdominal:     Palpations: Abdomen is soft.     Tenderness: There is no abdominal tenderness.  Musculoskeletal:        General: No deformity or edema. Normal range of motion.     Cervical back: Normal range of motion and neck supple. No rigidity or tenderness.  Skin:    General: Skin is warm and dry.     Capillary Refill: Capillary refill takes less than 2 seconds.  Neurological:     General: No focal deficit present.     Mental Status: She is alert and oriented to person, place, and time. Mental status is at baseline.     Cranial Nerves: No cranial nerve deficit.     Sensory: No sensory deficit.     Motor: Weakness present.     Comments: Patient with some bilateral lower extremity weakness which is baseline.  Psychiatric:  Mood and Affect: Mood and affect normal.     ED Results / Procedures / Treatments   Labs (all labs ordered are listed, but only abnormal results are displayed) Labs Reviewed - No data to display  EKG None  Radiology DG Ribs Unilateral W/Chest Right  Result Date: 04/06/2020 CLINICAL DATA:  [place], right anterior rib pain EXAM: RIGHT RIBS AND CHEST - 3+ VIEW COMPARISON:  07/19/2019 FINDINGS: Frontal view of the chest as well as frontal and oblique views of the right thoracic cage are  obtained. Continued enlargement of the cardiac silhouette with marked distension of the central pulmonary vasculature. No acute airspace disease, effusion, or pneumothorax. Vascular stent in the region of the right subclavian artery. There are no acute displaced fractures. IMPRESSION: 1. No acute displaced fractures. 2. Stable cardiomegaly and pulmonary vascular congestion. Electronically Signed   By: Randa Ngo M.D.   On: 04/06/2020 19:00   CT Head Wo Contrast  Result Date: 04/07/2020 CLINICAL DATA:  Fall EXAM: CT HEAD WITHOUT CONTRAST TECHNIQUE: Contiguous axial images were obtained from the base of the skull through the vertex without intravenous contrast. COMPARISON:  Jul 23, 2017 FINDINGS: Brain: No evidence of acute infarction, hemorrhage, hydrocephalus, extra-axial collection or mass lesion/mass effect. Periventricular white matter hypodensities consistent with sequela of chronic microvascular ischemic disease. Global parenchymal volume loss, advanced for age. Vascular: Vascular calcifications. Skull: No acute fracture. Heterogeneous appearance of the osseous structures consistent with renal osteodystrophy Sinuses/Orbits: No acute finding. Other: None. IMPRESSION: 1. No acute intracranial abnormality. Electronically Signed   By: Valentino Saxon MD   On: 04/07/2020 13:21   CT Cervical Spine Wo Contrast  Result Date: 04/07/2020 CLINICAL DATA:  Fall EXAM: CT CERVICAL SPINE WITHOUT CONTRAST TECHNIQUE: Multidetector CT imaging of the cervical spine was performed without intravenous contrast. Multiplanar CT image reconstructions were also generated. COMPARISON:  The December 28, 2011, Jul 19, 2019 FINDINGS: Alignment: Unchanged alignment of the spine with reversal of the cervical lordosis centered at the C4-5 with corresponding partial osseous ankylosis at this level. No acute spondylolisthesis. Skull base and vertebrae: Sclerotic and permeative heterogeneous appearance of the osseous structures likely  reflecting underlying renal osteodystrophy. This is similar in comparison to prior. No definitive acute fracture. 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 Partial osseous ankylosis of the dens and C1. This is unchanged in comparison to prior. Soft tissues and spinal canal: No prevertebral fluid or swelling. No visible canal hematoma. Disc levels: Loss of intervertebral disc space at C4-5. Severe intervertebral disc space height loss of C5-6 and C6-7. Multilevel facet arthropathy most pronounced at LEFT C3-4, unchanged in comparison to prior. Upper chest: Negative. Other: Atherosclerotic calcifications of bilateral carotid arteries. Coarse calcification of the LEFT thyroid. Subcentimeter thyroid nodules. IMPRESSION: 1. No definitive acute fracture or traumatic listhesis of the cervical spine. 2. Given multilevel partial osseous ankylosis, if there is persistent concern for ligamentous injury, recommend dedicated MRI. Electronically Signed   By: Valentino Saxon MD   On: 04/07/2020 13:30   DG Hips Bilat W or Wo Pelvis 3-4 Views  Result Date: 04/07/2020 CLINICAL DATA:  Fall EXAM: DG HIP (WITH OR WITHOUT PELVIS) 3-4V BILAT COMPARISON:  December 20, 2011, October 21, 2019 FINDINGS: Osteopenia. No acute fracture or dislocation. Mild bilateral degenerative changes of the hips with unchanged undulating contour of the femoral head and neck junctions. Evaluation of the sacrum is limited secondary to overlapping bowel gas. Vascular calcifications. No area of erosion or osseous destruction. No unexpected radiopaque foreign body. Soft tissues  are unremarkable. IMPRESSION: 1. Osteopenia. No acute fracture or dislocation. 2. If persistent concern for nondisplaced hip or pelvic fracture, recommend dedicated pelvic MRI. Electronically Signed   By: Valentino Saxon MD   On: 04/07/2020 11:57    Procedures Procedures   Medications Ordered in ED Medications - No data to display  ED Course  I have reviewed the triage vital signs  and the nursing notes.  Pertinent labs & imaging results that were available during my care of the patient were reviewed by me and considered in my medical decision making (see chart for details).    MDM Rules/Calculators/A&P                          Patient status post fall yesterday. [place] out of the back of a [place] in a wheelchair. Patient states she hit the back of her head. Also complaining of some hip and pelvis pain. Also complaining of some neck pain but has no tenderness to palpation to the posterior part of the neck. Will get CT head and neck and will get bilateral hip and pelvis.  Patient's got pretty good range of motion passively with both lower extremities. Patient is also not ambulatory. Do not feel that we need to do a CT of her pelvis or hips. Clinically they seem to be fine.  CT head and neck without any acute findings. And as mentioned yesterday she had negative rib series.  Patient stable for discharge back to nursing facility.    Final Clinical Impression(s) / ED Diagnoses Final diagnoses:  Fall, initial encounter  Injury of head, initial encounter  Acute pain of both hips    Rx / DC Orders ED Discharge Orders    None       Fredia Sorrow, MD 04/07/20 1410

## 2020-04-11 ENCOUNTER — Ambulatory Visit: Payer: Medicare Other | Admitting: Nurse Practitioner

## 2020-05-15 ENCOUNTER — Ambulatory Visit (INDEPENDENT_AMBULATORY_CARE_PROVIDER_SITE_OTHER): Payer: Medicare Other | Admitting: Nurse Practitioner

## 2020-05-15 ENCOUNTER — Encounter: Payer: Self-pay | Admitting: Nurse Practitioner

## 2020-05-15 ENCOUNTER — Other Ambulatory Visit: Payer: Self-pay

## 2020-05-15 VITALS — BP 174/114 | HR 69 | Temp 97.1°F | Ht 62.0 in | Wt 104.0 lb

## 2020-05-15 DIAGNOSIS — K8689 Other specified diseases of pancreas: Secondary | ICD-10-CM | POA: Insufficient documentation

## 2020-05-15 DIAGNOSIS — R935 Abnormal findings on diagnostic imaging of other abdominal regions, including retroperitoneum: Secondary | ICD-10-CM | POA: Diagnosis not present

## 2020-05-15 DIAGNOSIS — K838 Other specified diseases of biliary tract: Secondary | ICD-10-CM | POA: Diagnosis not present

## 2020-05-15 NOTE — Patient Instructions (Signed)
Your health issues we discussed today were:   Dilated common bile duct/pancreatic duct (drainage ducts for your liver and pancreas): 1. As we discussed, I will talk about this with Dr. Laural Golden 2. We may need to do an upper endoscopy with "ERCP" to look for any causes 3. Further recommendations will follow 4. There is a chance that this will need to be done in [place] 5. Call us if you have any symptoms such as worsening abdominal pain, worsening diarrhea, etc.  Overall I recommend:  1. Continue other current medications 2. Let us know if you have any questions or concerns 3. Follow-up in 3 months   ---------------------------------------------------------------  I am glad you have gotten your COVID-19 vaccination!  Even though you are fully vaccinated you should continue to follow CDC and state/local guidelines.  ---------------------------------------------------------------   At [facility] we value your feedback. You may receive a survey about your visit today. Please share your experience as we strive to create trusting relationships with our patients to provide genuine, compassionate, quality care.  We appreciate your understanding and patience as we review any laboratory studies, imaging, and other diagnostic tests that are ordered as we care for you. Our office policy is 5 business days for review of these results, and any emergent or urgent results are addressed in a timely manner for your best interest. If you do not hear from our office in 1 week, please contact us.   We also encourage the use of MyChart, which contains your medical information for your review as well. If you are not enrolled in this feature, an access code is on this after visit summary for your convenience. Thank you for allowing [place] to be involved in your care.  It was great to see you today!  I hope you have a great spring!!

## 2020-05-15 NOTE — Progress Notes (Signed)
Referring Provider: Hilbert Corrigan* Primary Care Physician:  Hilbert Corrigan, MD Primary GI:  Dr. Abbey Chatters  Chief Complaint  Patient presents with  . Follow-up    HPI:   [name] is a 71 y.o. female who presents for hospital follow-up.  The patient was last seen in our office 11/28/2019, which was also a hospital follow-up.  History of end-stage renal disease on hemodialysis, HIV, hypertension, chronic pain syndrome and resides in a nursing home.  During that hospitalization she had severe colitis treated with Cipro and Flagyl and subsequently improved.  During this noted dilated CBD that appeared chronic recommend consider outpatient MRCP versus CT of the abdomen pancreatic protocol.  At her last visit she was still with occasional diarrhea, hemodialysis Tuesday, Thursday, Saturday.  No other overt GI complaints.  Unsure if she is ever had a colonoscopy.  Recommended update colonoscopy, tapwater enema rather than fleets due to end-stage renal disease, medication adjustments, start probiotic for 1 to 2 months.  Patient underwent colonoscopy 01/02/2020 which found fair colon prep, nonbleeding internal hemorrhoids, diverticulosis, status post biopsies of the entire colon.  Recommended repeat in 5 years, follow-up with GI in 4 to 6 weeks after MRI/MRCP.  Surgical pathology found biopsies to be colonic mucosa with no specific histopathological changes.  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  Gallbladder mildly distended with sludge and small stones, pancreatic duct diffusely dilated to the ampulla measuring 7 mm, consider ERCP to evaluate for ampullary lesion or stricture.  Recontacted and endoscopist about possible ERCP.  Recommended recheck HFP now and in 3  months.  These do not appear to have been completed.  Does not appear that we heard back about ERCP.  Today she states she doing okay overall. She states HD causes her to feel weak. No ongoing abdominal pain other than after HD, she has some cramping. States sometimes her stools are mushy, sometimes formed/hard. No diarrhea. Has some N/V after dialysis. Denies hematochezia, melena, fever, chills. Feels she's lost weight, but not sure how much. Her last visit in September she weight 102 lbs, and today she weighs about 104 (per nursing home paperwork). Denies URI or flu-like symptoms. Denies loss of sense of taste or smell. The patient has received COVID-19 vaccination(s). They had a booster dose. Denies chest pain, dyspnea, dizziness, lightheadedness, syncope, near syncope. Denies any other upper or lower GI symptoms.  Past Medical History:  Diagnosis Date  . Anemia   . Anxiety   . Atrial flutter ([place]) 02/22/2015  . C. difficile colitis 10/10/11  . Chronic diarrhea   . Chronic pain   . Depression   . ESRD on hemodialysis [facility])    Dialysis [facility]  . GERD (gastroesophageal reflux disease)   . HIV (human immunodeficiency virus infection) ([place])   . Hypertension   . Insomnia   . Intracranial hemorrhage ([place])   . Muscle weakness (generalized)   . Pulmonary HTN ([place])   . Tachycardia   . TIA (transient ischemic attack)    [facility] do not have this dx  . Traumatic hematoma of right upper arm 02/22/2015    Past Surgical History:  Procedure Laterality Date  . A/V FISTULAGRAM N/A 04/17/2016   Procedure: A/V Fistulagram - Right Upper;  Surgeon: Waynetta Sandy, MD;  Location: [place] [facility];  Service: Cardiovascular;  Laterality: N/A;  . A/V FISTULAGRAM Right 08/28/2019   Procedure: A/V FISTULAGRAM;  Surgeon: Waynetta Sandy, MD;  Location: [place] [facility];  Service: Cardiovascular;  Laterality: Right;  . ABDOMINAL HYSTERECTOMY    . BIOPSY   01/02/2020   Procedure: BIOPSY;  Surgeon: Eloise Harman, DO;  Location: [facility];  Service: Endoscopy;;  . BIOPSY THYROID    . CARPAL TUNNEL RELEASE Right 11/16/2019   Procedure: CARPAL TUNNEL RELEASE;  Surgeon: Leanora Cover, MD;  Location: [place];  Service: Orthopedics;  Laterality: Right;  . COLONOSCOPY WITH PROPOFOL N/A 01/02/2020   Procedure: COLONOSCOPY WITH PROPOFOL;  Surgeon: Eloise Harman, DO;  Location: [facility];  Service: Endoscopy;  Laterality: N/A;  1:15pm-pt at [facility]  . Dialysis Shunts     previous one removed from left arm and now present one in right arm  . DIALYSIS/PERMA CATHETER INSERTION Right 04/17/2016   Procedure: dialysis Catheter Insertion central veinous;  Surgeon: Waynetta Sandy, MD;  Location: [place] [facility];  Service: Cardiovascular;  Laterality: Right;  . ESOPHAGOGASTRODUODENOSCOPY  12/15/2010   Rourk: erosive reflux esophagitis, MW tear, hiatal hernia (small), gastritis with no h.pylori, possibly nsaid related  . EXCISION OF BREAST BIOPSY Right 05/04/2012   Procedure: EXCISION OF BREAST BIOPSY;  Surgeon: Donato Heinz, MD;  Location: [facility];  Service: General;  Laterality: Right;  Right Excisional Breast Biopsy  . FISTULOGRAM Right 03/26/2014   Procedure: Right Arm Fistulogram with Venoplasty Right Subclavian Vein and Inominate Vein. Debridement Fistula Ulcer;  Surgeon: Conrad Jacksboro, MD;  Location: [place];  Service: Vascular;  Laterality: Right;  . FISTULOGRAM Right 03/29/2017   Procedure: FISTULOGRAM COMPLEX RIGHT ARM with Balloon angioplasty;  Surgeon: Waynetta Sandy, MD;  Location: [place];  Service: Vascular;  Laterality: Right;  . IR GENERIC HISTORICAL  05/19/2016   IR REMOVAL TUN CV CATH W/O FL 05/19/2016 Markus Daft, MD [facility]-INTERV RAD  . PERIPHERAL VASCULAR BALLOON ANGIOPLASTY Right 04/17/2016   Procedure: Peripheral Vascular Balloon Angioplasty;  Surgeon: Waynetta Sandy, MD;  Location: [place] [facility];   Service: Cardiovascular;  Laterality: Right;  Central segment AV Fistula  . PERIPHERAL VASCULAR BALLOON ANGIOPLASTY Right 03/14/2018   Procedure: PERIPHERAL VASCULAR BALLOON ANGIOPLASTY;  Surgeon: Waynetta Sandy, MD;  Location: [place] [facility];  Service: Cardiovascular;  Laterality: Right;  subclavian vein  . PERIPHERAL VASCULAR BALLOON ANGIOPLASTY Right 08/28/2019   Procedure: PERIPHERAL VASCULAR BALLOON ANGIOPLASTY;  Surgeon: Waynetta Sandy, MD;  Location: [place] [facility];  Service: Cardiovascular;  Laterality: Right;  upper arm  . PERIPHERAL VASCULAR INTERVENTION Right 03/14/2018   Procedure: PERIPHERAL VASCULAR INTERVENTION;  Surgeon: Waynetta Sandy, MD;  Location: [place] [facility];  Service: Cardiovascular;  Laterality: Right;  subclavian vein  . SHUNTOGRAM Right 10/19/2013   Procedure: FISTULOGRAM;  Surgeon: Conrad Schenectady, MD;  Location: [facility] CATH LAB;  Service: Cardiovascular;  Laterality: Right;  . TONSILLECTOMY    . VISCERAL VENOGRAPHY Right 03/14/2018   Procedure: CENTRAL VENOGRAPHY;  Surgeon: Waynetta Sandy, MD;  Location: [place] [facility];  Service: Cardiovascular;  Laterality: Right;  upper ext fistula    Current Outpatient Medications  Medication Sig Dispense Refill  . calcium acetate, Phos Binder, (PHOSLYRA) 667 MG/5ML SOLN Take 2,001 mg by mouth 3 (three) times daily with meals. (0800, 1200, & 2000)    . clopidogrel (PLAVIX) 75 MG tablet Take 1  tablet (75 mg total) by mouth daily. (Patient taking differently: Take 75 mg by mouth daily. (0800)) 30 tablet 3  . cyanocobalamin (,VITAMIN B-12,) 1000 MCG/ML injection Inject 1 mL (1,000 mcg total) into the muscle every 28 (twenty-eight) days. 1 mL 12  . Emollient (MINERIN) LOTN Apply 1 application topically every 12 (twelve) hours as needed (dry skin). Apply to feet and ankles    . escitalopram (LEXAPRO) 10 MG tablet Take 10 mg by mouth daily. (0800)    . fentaNYL (DURAGESIC) 25 MCG/HR Place  1 patch onto the skin every 3 (three) days.    . hydrocortisone cream 1 % Apply 1 application topically every 6 (six) hours as needed (hemorrhoids).    . lamivudine (EPIVIR) 100 MG tablet Take 50 mg by mouth daily. (0800)    . Lidocaine HCl 4 % CREA Apply 1 application topically every 12 (twelve) hours as needed (wrist pain).    . methadone (DOLOPHINE) 5 MG tablet Take 2.5 mg by mouth daily at 8 pm.     . Multiple Vitamin (MULTIVITAMIN WITH MINERALS) TABS tablet Take 1 tablet by mouth at bedtime. (2000)    . nitroGLYCERIN (NITROSTAT) 0.4 MG SL tablet Place 0.4 mg under the tongue every 5 (five) minutes x 3 doses as needed for chest pain.     Marland Kitchen ondansetron (ZOFRAN-ODT) 4 MG disintegrating tablet Take 4 mg by mouth every 8 (eight) hours as needed for nausea or vomiting.     . Oxycodone HCl 10 MG TABS Take 1 tablet (10 mg total) by mouth every 12 (twelve) hours as needed (severe pain). 5 tablet 0  . polyethylene glycol-electrolytes (NULYTELY) 420 g solution As directed 4000 mL 0  . promethazine (PHENERGAN) 12.5 MG tablet Take 12.5 mg by mouth See admin instructions. Take 12.5 mg by mouth in the morning every Tuesday, Thursday and Saturday for nausea/vomiting and every 6 hours as needed for n/v    . saccharomyces boulardii (FLORASTOR) 250 MG capsule Take 250 mg by mouth 2 (two) times daily.    . vitamin C (ASCORBIC ACID) 500 MG tablet Take 500 mg by mouth 2 (two) times daily. (0900 & 2100)    . zidovudine (RETROVIR) 100 MG capsule Take 100 mg by mouth 3 (three) times daily. (0900, 1300, & 2100)    . Zinc 220 (50 Zn) MG CAPS Take 220 mg by mouth daily. (0800)    . Biotin 5000 MCG CAPS Take 5,000 mcg by mouth daily. (0800) (Patient not taking: Reported on 05/15/2020)    . calcium elemental as carbonate (BARIATRIC TUMS ULTRA) 400 MG chewable tablet Chew 1,000 mg by mouth 2 (two) times daily. (Patient not taking: Reported on 05/15/2020)    . dolutegravir (TIVICAY) 50 MG tablet Take 50 mg by mouth daily at 4  PM.  (Patient not taking: Reported on 05/15/2020)    . famotidine (PEPCID) 20 MG tablet Take 20 mg by mouth daily.    Marland Kitchen loperamide (IMODIUM) 2 MG capsule Take 2-4 mg by mouth as needed for diarrhea or loose stools.  (Patient not taking: Reported on 05/15/2020)     No current facility-administered medications for this visit.   Facility-Administered Medications Ordered in Other Visits  Medication Dose Route Frequency Provider Last Rate Last Admin  . 0.9 %  sodium chloride infusion (Manually program via Guardrails IV Fluids)  250 mL Intravenous Once Lockamy, Randi L, NP-C      . acetaminophen (TYLENOL) tablet 650 mg  650 mg Oral Once [place], Lala Lund  L, NP-C      . diphenhydrAMINE (BENADRYL) capsule 25 mg  25 mg Oral Once Lockamy, Randi L, NP-C      . sodium chloride flush (NS) 0.9 % injection 10 mL  10 mL Intracatheter PRN Lockamy, Randi L, NP-C        Allergies as of 05/15/2020 - Review Complete 05/15/2020  Allergen Reaction Noted  . Lactose intolerance (gi) Other (See Comments) 03/29/2017  . Latex Rash and Itching 03/09/2011  . Penicillins Rash and Swelling 04/02/2011    Family History  Problem Relation Age of Onset  . Anesthesia problems Neg Hx   . Hypotension Neg Hx   . Malignant hyperthermia Neg Hx   . Pseudochol deficiency Neg Hx   . Colon cancer Neg Hx        Unsure of parents who both died when she was a baby    Social History   Socioeconomic History  . Marital status: Widowed    Spouse name: Not on file  . Number of children: 1  . Years of education: Not on file  . Highest education level: Not on file  Occupational History  . Not on file  Tobacco Use  . Smoking status: Former Smoker    Types: Cigarettes    Quit date: 03/12/1983    Years since quitting: 37.2  . Smokeless tobacco: Never Used  Vaping Use  . Vaping Use: Never used  Substance and Sexual Activity  . Alcohol use: No    Alcohol/week: 0.0 standard drinks  . Drug use: No  . Sexual activity: Not Currently     Birth control/protection: Surgical    Comment: Hysterectomy  Other Topics Concern  . Not on file  Social History Narrative  . Not on file   Social Determinants of Health   Financial Resource Strain: Not on file  Food Insecurity: Not on file  Transportation Needs: Not on file  Physical Activity: Not on file  Stress: Not on file  Social Connections: Not on file    Subjective: Review of Systems  Constitutional: Negative for chills, fever, malaise/fatigue and weight loss.  HENT: Negative for congestion and sore throat.   Respiratory: Negative for cough and shortness of breath.   Cardiovascular: Negative for chest pain and palpitations.  Gastrointestinal: Positive for abdominal pain, nausea and vomiting. Negative for blood in stool, constipation, diarrhea, heartburn and melena.  Musculoskeletal: Negative for joint pain and myalgias.  Skin: Negative for rash.  Neurological: Negative for dizziness and weakness.  Endo/Heme/Allergies: Does not bruise/bleed easily.  Psychiatric/Behavioral: Negative for depression. The patient is not nervous/anxious.   All other systems reviewed and are negative.    Objective: BP (!) 174/114   Pulse 69   Temp (!) 97.1 F (36.2 C) (Temporal)   Ht 5\' 2"  (1.575 m)   Wt 104 lb (47.2 kg)   LMP  (LMP Unknown)   BMI 19.02 kg/m  Physical Exam Vitals and nursing note reviewed.  Constitutional:      General: She is not in acute distress.    Appearance: Normal appearance. She is well-developed. She is not ill-appearing, toxic-appearing or diaphoretic.  HENT:     Head: Normocephalic and atraumatic.     Nose: No congestion or rhinorrhea.  Eyes:     General: No scleral icterus. Cardiovascular:     Rate and Rhythm: Normal rate and regular rhythm.     Heart sounds: Normal heart sounds.  Pulmonary:     Effort: Pulmonary effort is normal. No respiratory  distress.     Breath sounds: Normal breath sounds.  Abdominal:     General: Bowel sounds are  normal.     Palpations: Abdomen is soft. There is no hepatomegaly, splenomegaly or mass.     Tenderness: There is no abdominal tenderness. There is no guarding or rebound.     Hernia: No hernia is present.  Skin:    General: Skin is warm and dry.     Coloration: Skin is not jaundiced.     Findings: No rash.  Neurological:     General: No focal deficit present.     Mental Status: She is alert and oriented to person, place, and time.  Psychiatric:        Attention and Perception: Attention normal.        Mood and Affect: Mood normal.        Speech: Speech normal.        Behavior: Behavior normal.        Thought Content: Thought content normal.        Cognition and Memory: Cognition and memory normal.      Assessment:  Very pleasant 70 year old female presents for follow-up with a known history of apparent stable CBD and pancreatic ductal dilation, most recently evaluated by MRI/MRCP in December 2021.  CBD measures 1.4 cm and generally unchanged, pancreatic duct measuring 7 mm.  Recommend consideration of ERCP.  Patient is generally asymptomatic at this time other than effects of hemodialysis.  Denies significant abdominal pain, diarrhea.  No obvious pancreatic mass on imaging.  At this point due to recommendation for ERCP I will discuss with Dr. Laural Golden about feasibility of ERCP locally versus transfer to [facility] for this.  Can be completed as an outpatient.  Further recommendations to follow.   Plan: 1. Continue current medications 2. Discussed with Dr. Melony Overly feasibility of ERCP in [place] 3. Otherwise would need to send to [facility] 4. Further recommendations to follow    Thank you for allowing [place] to participate in the care of [place], DNP, AGNP-C Adult & Gerontological Nurse Practitioner [facility]   05/15/2020 8:34 AM   Disclaimer: This note was dictated with voice recognition software. Similar sounding words can  inadvertently be transcribed and may not be corrected upon review.

## 2020-05-24 ENCOUNTER — Telehealth: Payer: Self-pay | Admitting: Nurse Practitioner

## 2020-05-24 DIAGNOSIS — K838 Other specified diseases of biliary tract: Secondary | ICD-10-CM

## 2020-05-24 DIAGNOSIS — K859 Acute pancreatitis without necrosis or infection, unspecified: Secondary | ICD-10-CM

## 2020-05-24 NOTE — Telephone Encounter (Signed)
Discussed case with Dr. Laural Golden. He spoke with Dr. Rush Landmark in [place]. Recommend LFTs and if normal will plan EUS; otherwise will plan ERCP in [facility] with Dr. Rush Landmark.  Please call the patient and let her know to have her lab test done ASAP and we will call with further recommendations.  Cc: Dr. Laural Golden and Dr. Abbey Chatters for [facility]

## 2020-05-26 NOTE — Telephone Encounter (Signed)
As above I talk with Dr. Rush Landmark on 05/04/2020 EUS planned unless LFTs are elevated. These have already been ordered by Mr. [name], NP

## 2020-05-27 NOTE — Telephone Encounter (Signed)
Phoned to [facility], LM on [place] (supervisor) to return call regarding this pt needing labs done.

## 2020-05-27 NOTE — Telephone Encounter (Signed)
[name] from [place] phoned back and I advised of the blood work needing to be done for the pt.  They will get this done

## 2020-05-28 ENCOUNTER — Telehealth: Payer: Self-pay | Admitting: Gastroenterology

## 2020-05-28 ENCOUNTER — Other Ambulatory Visit: Payer: Self-pay

## 2020-05-28 DIAGNOSIS — R935 Abnormal findings on diagnostic imaging of other abdominal regions, including retroperitoneum: Secondary | ICD-10-CM

## 2020-05-28 DIAGNOSIS — K859 Acute pancreatitis without necrosis or infection, unspecified: Secondary | ICD-10-CM

## 2020-05-28 DIAGNOSIS — K8689 Other specified diseases of pancreas: Secondary | ICD-10-CM

## 2020-05-28 DIAGNOSIS — K838 Other specified diseases of biliary tract: Secondary | ICD-10-CM

## 2020-05-28 NOTE — Telephone Encounter (Signed)
EUS scheduled for 5/12 at 10 am at [facility] with Dr Rush Landmark.  COVID test on 07/08/20 at 950 am.

## 2020-05-28 NOTE — Telephone Encounter (Signed)
noted 

## 2020-05-28 NOTE — Telephone Encounter (Signed)
Pt is currently at [facility] can call and speak with the nurse Remo Lipps or supervisor Doristine Johns.

## 2020-05-28 NOTE — Telephone Encounter (Signed)
Please keep [place] abreast to the results. Hopefully you will hear back about Plavix use or not soon. Thanks. GM

## 2020-05-28 NOTE — Telephone Encounter (Signed)
Tried to reach the pt by phone. Line rings then goes to busy.  Instructions mailed to the pt home.  I also need to see if the pt is still on plavix.

## 2020-05-28 NOTE — Telephone Encounter (Signed)
[name] I just did a reminder sticker for myself.

## 2020-05-28 NOTE — Telephone Encounter (Signed)
Thanks for your help. Our nurse got in touch with the facility and they'll have labs done. I'll try and get them in the chart as soon as we get them back.  Thanks! Randall Hiss

## 2020-05-28 NOTE — Telephone Encounter (Signed)
Spoke with Dr. Laural Golden about this patient and imaging. Patient has a double duct sign and still has gallbladder in place. Etiology unclear. No recent LFTs but these will be obtained locally. Patient needs an EUS with DJ or myself in the coming weeks for further evaluation and exclusion of an ampullary lesion. If LFTs return elevated, then consideration/need for ERCP will be had. Dr. Laural Golden and NP Gordy Levan will get those. For now proceed with scheduling EUS.  Thanks. GM  Juluis Rainier DJ

## 2020-05-28 NOTE — Telephone Encounter (Signed)
Thanks!!!  Can you help me remember to check the facility for results on 4/1 or 4/4? I need to get them to [facility] so they can decide on EUS vs ERCP.  Thanks!

## 2020-05-29 NOTE — Telephone Encounter (Signed)
Left message for Remo Lipps to return call from [facility].

## 2020-05-31 NOTE — Telephone Encounter (Signed)
I spoke with the nurse at [facility] and faxed the instructions for the upcoming procedure to [phone].  Also, asked for clearance to stop plavix 5 days prior .

## 2020-06-04 ENCOUNTER — Telehealth: Payer: Self-pay | Admitting: Nurse Practitioner

## 2020-06-04 NOTE — Telephone Encounter (Signed)
I do not see any results in the labs tab. Can we call her facility [facility]) and check to see if they did the LFTs yet and if so do they have the results that they can fax [place].

## 2020-06-05 NOTE — Telephone Encounter (Signed)
Thanks for the LFTs. Since normal, just plan for EUS. Chong Sicilian has already scheduled. Thanks. GM

## 2020-06-05 NOTE — Telephone Encounter (Signed)
Ronalee Red labs on your desk

## 2020-06-05 NOTE — Telephone Encounter (Signed)
FYI, I received the LFTs back from the nursing home facility.  I will marked him for scanning into the chart, but right now they are not in the labs have because they were from an outside facility.  Essentially, everything is normal.  Total protein 8.1, albumin 4.0, total bilirubin 0.5, direct bilirubin 0.14, alkaline phosphatase 92, AST/ALT is 15/7.  Full report should be available in the media tab/lab staff shortly.

## 2020-06-05 NOTE — Telephone Encounter (Signed)
Phoned to [facility] spoke with Remo Lipps and she will fax [place] the pt's lab results.

## 2020-06-05 NOTE — Telephone Encounter (Signed)
Marked for scanning. Results sent to Dr. Rush Landmark.

## 2020-07-01 ENCOUNTER — Telehealth: Payer: Self-pay

## 2020-07-01 NOTE — Telephone Encounter (Signed)
Placed a call to [facility] to inquire about plavix.  Left message on machine to call back

## 2020-07-01 NOTE — Telephone Encounter (Signed)
-----   Message from Timothy Lasso, RN sent at 05/31/2020 10:01 AM EDT ----- Make sure pt has the ok to hold plavix.

## 2020-07-02 NOTE — Telephone Encounter (Signed)
Tried to reach the nurse again at [place]. I was transferred to the nurse that takes care of the pt and the line rings many times and never goes to voice mail and no one answers.  Will try again later

## 2020-07-08 ENCOUNTER — Other Ambulatory Visit (HOSPITAL_COMMUNITY)
Admission: RE | Admit: 2020-07-08 | Discharge: 2020-07-08 | Disposition: A | Discharge status: Home / Self Care | Payer: Medicare Other | Source: Ambulatory Visit | Attending: Gastroenterology | Admitting: Gastroenterology

## 2020-07-08 DIAGNOSIS — Z01812 Encounter for preprocedural laboratory examination: Secondary | ICD-10-CM | POA: Insufficient documentation

## 2020-07-08 DIAGNOSIS — Z20822 Contact with and (suspected) exposure to covid-19: Secondary | ICD-10-CM | POA: Insufficient documentation

## 2020-07-08 LAB — SARS CORONAVIRUS 2 (TAT 6-24 HRS): SARS Coronavirus 2: NEGATIVE

## 2020-07-09 NOTE — Telephone Encounter (Signed)
I confirmed with [name] that the pt plavix has been held since 5/7.

## 2020-07-10 ENCOUNTER — Encounter (HOSPITAL_COMMUNITY): Payer: Self-pay | Admitting: Gastroenterology

## 2020-07-10 NOTE — Anesthesia Preprocedure Evaluation (Addendum)
Anesthesia Evaluation  Patient identified by MRN, date of birth, ID band Patient awake    Reviewed: Allergy & Precautions, NPO status , Patient's Chart, lab work & pertinent test results, reviewed documented beta blocker date and time   Airway Mallampati: II  TM Distance: >3 FB Neck ROM: Full    Dental  (+) Edentulous Upper, Edentulous Lower   Pulmonary pneumonia, resolved, former smoker,    Pulmonary exam normal breath sounds clear to auscultation       Cardiovascular hypertension, Pt. on medications +CHF  Normal cardiovascular exam+ dysrhythmias Atrial Fibrillation  Rhythm:Regular Rate:Normal  Pulmonary HTN   EKG 10/02/19 Sinus rhythm, PAC, Lateral t wave inversion  Echo 02/21/2015 Left ventricle: The cavity size was normal. Wall thickness was normal. Systolic function was normal. The estimated ejection fraction was in the range of 60% to 65%.  - Mitral valve: There was mild regurgitation.  - Right atrium: The atrium was mildly dilated.     Neuro/Psych  Headaches, PSYCHIATRIC DISORDERS Anxiety Depression Chronic pain syndromeTIA   GI/Hepatic Neg liver ROS, PUD, GERD  Medicated,Ampullary lesion Dilated CBD   Endo/Other    Renal/GU ESRF and DialysisRenal diseaseLast dialysis 5/10   negative genitourinary   Musculoskeletal negative musculoskeletal ROS (+)   Abdominal   Peds  Hematology  (+) anemia , HIV, Plavix therapy-last dose 07/06/20   Anesthesia Other Findings   Reproductive/Obstetrics                         Anesthesia Physical Anesthesia Plan  ASA: IV  Anesthesia Plan: MAC   Post-op Pain Management:    Induction: Intravenous  PONV Risk Score and Plan: 3 and Treatment may vary due to age or medical condition and Propofol infusion  Airway Management Planned: Natural Airway and Nasal Cannula  Additional Equipment:   Intra-op Plan:   Post-operative Plan:   Informed  Consent: I have reviewed the patients History and Physical, chart, labs and discussed the procedure including the risks, benefits and alternatives for the proposed anesthesia with the patient or authorized representative who has indicated his/her understanding and acceptance.       Plan Discussed with: CRNA and Anesthesiologist  Anesthesia Plan Comments: (PAT note written 07/10/2020 by Myra Gianotti, PA-C. )      Anesthesia Quick Evaluation

## 2020-07-10 NOTE — Progress Notes (Signed)
Trenton Gammon, Timmothy Sours Fax # [phone]  Surgical Instructions for Devi Hopman Surgery time 10 am- 11:15 am.     T[place]matha procedure is scheduled on Thursday, 07/11/20 at 10 am..  Report to [facility] Main Entrance "A" at 7 A.M., then check in with the Admitting office.  Call this number if you have problems the morning of surgery:  [phone]   If you have any questions prior to your surgery date call [phone]: Open Monday-Friday 8am-4pm    Remember:  Do not eat or drink after midnight tonight-Wednesday.       Take these medicines the morning of surgery with A SIP OF WATER: Baclofen prn Pepcid Fentanyl patch PRN Epivir Claritin Oxycodone PRN Nitro PRN Phenergan Effexor Retrovir  As of today, STOP taking any Aspirin (unless otherwise instructed by your surgeon) Aleve, Naproxen, Ibuprofen, Motrin, Advil, Goody's, BC's, all herbal medications, fish oil, and all vitamins.                     Do not wear jewelry, make up, or nail polish            Do not wear lotions, powders, perfumes, or deodorant.            Do not shave 48 hours prior to surgery.             Do not bring valuables to the hospital.            [facility] is not responsible for any belongings or valuables.  Do NOT Smoke (Tobacco/Vaping) or drink Alcohol 24 hours prior to your procedure   Oral Hygiene is also important to reduce your risk of infection.  Remember - BRUSH YOUR TEETH THE MORNING OF SURGERY WITH YOUR REGULAR TOOTHPASTE  Contacts, glasses, dentures or bridgework may not be worn into surgery, please bring cases for these belongings.   Patients discharged the day of surgery will not be allowed to drive home, and someone needs to stay with them for 24 hours.    Remember on Day of Surgery:  Wear Clean/Comfortable clothing the morning of surgery Do not apply any deodorants/lotions.   Remember to brush your teeth WITH YOUR REGULAR TOOTHPASTE.

## 2020-07-10 NOTE — Progress Notes (Signed)
Anesthesia Chart Review: SAME DAY WORK-UP   Case: [id number] Date/Time: 07/11/20 1000   Procedure: UPPER ENDOSCOPIC ULTRASOUND (EUS) RADIAL (N/A )   Anesthesia type: Monitor Anesthesia Care   Pre-op diagnosis: ampullary lesion, abn bile duct   Location: [facility] ROOM 1 / [facility]   Surgeons: Mansouraty, Telford Nab., MD      DISCUSSION: Patient is a 70 year old female scheduled for the above procedure. Known dilated common bile duct by CT imaging 10/2019, thought to be chronic. Per 05/15/20 GI note by [name], [facility], "MRI/MRCP completed 02/06/2020 which found redemonstrated severe intra and extrahepatic biliary ductal dilation to the ampulla with CBD measuring at least 1.4 cm, generally unchanged compared to prior CT, no evidence of calculus or other obstructing lesion.  Gallbladder mildly distended with sludge and small stones, pancreatic duct diffusely dilated to the ampulla measuring 7 mm, consider ERCP to evaluate for ampullary lesion or stricture.  Recontacted and endoscopist about possible ERCP." Normal LFTs on 05/30/20. GI confirmed with [facility] that Plavix (appears to be on for right innominate artery stent in setting of RUE AVF with RUE swelling) on hold since 07/06/20.   History includes former smoker (quit 03/12/83), HTN, HIV, ESRD (on hemodialysis, [name]), TIA, atrial flutter (SVT 08/2012, a-flutter 08/2014), pulmonary hypertension, tachycardia, anemia, GERD, generalized muscle weakness, C. Difficile colitis (2013, 07/2019), chronic pain. She has a RUE AVF, s/p drug-coated balloon angioplasty of right innominate stent on 08/28/19 due to RUE swelling (previous drug coated balloon angioplasty of right innominate and subclavian veins 03/29/17 and BMS to right innominate vein on 03/14/18; drug-coated balloon angioplasty to right innominate stent 08/28/19).   Last cardiology visit with Dr. Harrington Challenger was on 08/04/19 for follow-up HTN and aflutter. 2014 cath normal at [facility]. Only noted on documented  episode of aflutter in 2016, so Dr. Harrington Challenger did not recommend starting anticoagulation unless recurrence. As needed cardiology follow-up recommended. She is not on medication for pulmonary hypertension. She has a chronically abnormal EKG--abnormal anterolateral T wave abnormality dating back to at least 12/20216.   07/08/20 preprocedure COVID-19 test negative. Anesthesia team to evaluate on the day of procedure. She will at minimum need ISTAT labs given ESRD history.    VS:  BP Readings from Last 3 Encounters:  05/15/20 (!) 174/114  04/07/20 138/74  04/06/20 140/81   Pulse Readings from Last 3 Encounters:  05/15/20 69  04/07/20 65  04/06/20 77    PROVIDERS: Hilbert Corrigan, MD is PCP  - Dorris Carnes, MD is cardiologist. Last visit 08/04/19 with as needed follow-up.  Hurshel Keys, DO is primary GI - Bobby Rumpf, MD is ID. Last visit 12/22/19. No change in HIV medications. 9 month follow-up planned.  Derek Jack, MD is HEM-ONC Last visit 10/25/19 for follow-up pancytopenia. By notes, "We have done infectious disease panel including CMV, EBV and parvo B19 and hepatitis panel which were negative. EBV IgG antibody was positive but IgM was negative." Continue erythropoiesis stimulating agent and iron as needed with dialysis. Receiving monthly B12 injections. Re-evaluate in 4 months.    LABS: For day of procedure.   IMAGES: CT C-spine 04/07/20: IMPRESSION: 1. No definitive acute fracture or traumatic listhesis of the cervical spine. 2. Given multilevel partial osseous ankylosis, if there is persistent concern for ligamentous injury, recommend dedicated MRI.  MRI Abd/MRCP 02/06/20: IMPRESSION: 1. Redemonstrated, severe intra and extrahepatic biliary ductal dilatation to the ampulla, common bile duct measuring at least 1.4 cm in caliber. This  appearance is generally unchanged compared to prior CT. No evident calculus or other obstructing lesion identified on  noncontrast MR. 2. The gallbladder is mildly distended and contains sludge and small gallstones. No findings of acute cholecystitis. 3. The pancreatic duct is diffusely dilated to the ampulla measuring up to 7 mm in caliber. 4. Consider ERCP to evaluate for ampullary lesion or stricture. 5. Atrophic, multicystic kidneys, in keeping with end-stage renal disease.  1V PCXR 07/29/19: IMPRESSION: 1. Bibasilar pneumonia, LEFT greater than RIGHT. 2. Stable cardiomegaly without evidence of pulmonary edema. 3. Massive enlargement of the central pulmonary arteries indicating pulmonary arterial hypertension, with interval increase in size of the pulmonary arteries since the examination 2 years ago.    EKG: 09/29/19: Sinus rhythm Atrial premature complexes Abnrm T, consider ischemia, anterolateral leads Prolonged QT interval No STEMI Confirmed by Nanda Quinton [phone]) on 09/29/2019 1:53:07 PM - She has had previous anterolateral T wave abnormality, more non-specific on 07/19/19 tracing but inverted T wave on 07/23/17 and 02/21/15 tracings.    CV: Echo 02/21/15: Study Conclusions  - Left ventricle: The cavity size was normal. Wall thickness was  normal. Systolic function was normal. The estimated ejection  fraction was in the range of 60% to 65%.  - Mitral valve: There was mild regurgitation.  - Right atrium: The atrium was mildly dilated.  - Right ventricle: The cavity size was normal. Wall thickness was     Normal. Systolic function was normal. - Tricuspid valve: Structurally normal. Transvalvular velocity was    Within the normal range. There was no regurgitation.  (Comparison: PA systolic pressure 61 mmHg 29/51/88; PA systolic pressure 40 mmHg 06/14/08; LVEF 60-65%, mild concentric LVH, mildly dilated LA, moderately dilated RV cavity with normal wall thickness, RVSF moderately to severely reduced, RA moderately dilated, PA peak pressure 72 mmHg 01/21/09)   Cardiac cath 09/16/12  ([facility]): DIAGNOSTIC SUMMARY  Coronary Artery Disease   Left Main: normal   LAD system: normal   LCX system: normal   RCA system: normal   No significant CAD indicated    Past Medical History:  Diagnosis Date  . Anemia   . Anxiety   . Atrial flutter ([place]) 02/22/2015  . C. difficile colitis 10/10/11  . Chronic diarrhea   . Chronic pain   . Depression   . ESRD on hemodialysis [facility])    Dialysis [facility]  . GERD (gastroesophageal reflux disease)   . HIV (human immunodeficiency virus infection) ([place])   . Hypertension   . Insomnia   . Intracranial hemorrhage ([place])   . Muscle weakness (generalized)   . Pulmonary HTN ([place])   . Tachycardia   . TIA (transient ischemic attack)    [facility] do not have this dx  . Traumatic hematoma of right upper arm 02/22/2015    Past Surgical History:  Procedure Laterality Date  . A/V FISTULAGRAM N/A 04/17/2016   Procedure: A/V Fistulagram - Right Upper;  Surgeon: Waynetta Sandy, MD;  Location: [place] CV LAB;  Service: Cardiovascular;  Laterality: N/A;  . A/V FISTULAGRAM Right 08/28/2019   Procedure: A/V FISTULAGRAM;  Surgeon: Waynetta Sandy, MD;  Location: [place] CV LAB;  Service: Cardiovascular;  Laterality: Right;  . ABDOMINAL HYSTERECTOMY    . BIOPSY  01/02/2020   Procedure: BIOPSY;  Surgeon: Eloise Harman, DO;  Location: [facility];  Service: Endoscopy;;  . BIOPSY THYROID    . CARPAL TUNNEL RELEASE Right 11/16/2019   Procedure: CARPAL  TUNNEL RELEASE;  Surgeon: Leanora Cover, MD;  Location: [place];  Service: Orthopedics;  Laterality: Right;  . COLONOSCOPY WITH PROPOFOL N/A 01/02/2020   Procedure: COLONOSCOPY WITH PROPOFOL;  Surgeon: Eloise Harman, DO;  Location: [facility];  Service: Endoscopy;  Laterality: N/A;  1:15pm-pt at [facility]  . Dialysis Shunts     previous one removed from left arm and now present one in right arm  . DIALYSIS/PERMA  CATHETER INSERTION Right 04/17/2016   Procedure: dialysis Catheter Insertion central veinous;  Surgeon: Waynetta Sandy, MD;  Location: [place] CV LAB;  Service: Cardiovascular;  Laterality: Right;  . ESOPHAGOGASTRODUODENOSCOPY  12/15/2010   Rourk: erosive reflux esophagitis, MW tear, hiatal hernia (small), gastritis with no h.pylori, possibly nsaid related  . EXCISION OF BREAST BIOPSY Right 05/04/2012   Procedure: EXCISION OF BREAST BIOPSY;  Surgeon: Donato Heinz, MD;  Location: [facility] ORS;  Service: General;  Laterality: Right;  Right Excisional Breast Biopsy  . FISTULOGRAM Right 03/26/2014   Procedure: Right Arm Fistulogram with Venoplasty Right Subclavian Vein and Inominate Vein. Debridement Fistula Ulcer;  Surgeon: Conrad Connerton, MD;  Location: [place];  Service: Vascular;  Laterality: Right;  . FISTULOGRAM Right 03/29/2017   Procedure: FISTULOGRAM COMPLEX RIGHT ARM with Balloon angioplasty;  Surgeon: Waynetta Sandy, MD;  Location: [place];  Service: Vascular;  Laterality: Right;  . IR GENERIC HISTORICAL  05/19/2016   IR REMOVAL TUN CV CATH W/O FL 05/19/2016 Markus Daft, MD [facility]-INTER[facility] RAD  . PERIPHERAL VASCULAR BALLOON ANGIOPLASTY Right 04/17/2016   Procedure: Peripheral Vascular Balloon Angioplasty;  Surgeon: Waynetta Sandy, MD;  Location: [place] CV LAB;  Service: Cardiovascular;  Laterality: Right;  Central segment AV Fistula  . PERIPHERAL VASCULAR BALLOON ANGIOPLASTY Right 03/14/2018   Procedure: PERIPHERAL VASCULAR BALLOON ANGIOPLASTY;  Surgeon: Waynetta Sandy, MD;  Location: [place] CV LAB;  Service: Cardiovascular;  Laterality: Right;  subclavian vein  . PERIPHERAL VASCULAR BALLOON ANGIOPLASTY Right 08/28/2019   Procedure: PERIPHERAL VASCULAR BALLOON ANGIOPLASTY;  Surgeon: Waynetta Sandy, MD;  Location: [place] CV LAB;  Service: Cardiovascular;  Laterality: Right;  upper arm  . PERIPHERAL VASCULAR INTERVENTION Right 03/14/2018    Procedure: PERIPHERAL VASCULAR INTERVENTION;  Surgeon: Waynetta Sandy, MD;  Location: [place] CV LAB;  Service: Cardiovascular;  Laterality: Right;  subclavian vein  . SHUNTOGRAM Right 10/19/2013   Procedure: FISTULOGRAM;  Surgeon: Conrad [place], MD;  Location: [facility] CATH LAB;  Service: Cardiovascular;  Laterality: Right;  . TONSILLECTOMY    . VISCERAL VENOGRAPHY Right 03/14/2018   Procedure: CENTRAL VENOGRAPHY;  Surgeon: Waynetta Sandy, MD;  Location: [place] CV LAB;  Service: Cardiovascular;  Laterality: Right;  upper ext fistula    MEDICATIONS: No current facility-administered medications for this encounter.   . Baclofen 5 MG TABS  . Biotin 5000 MCG TABS  . Calcium Acetate 667 MG TABS  . clopidogrel (PLAVIX) 75 MG tablet  . cyanocobalamin (,VITAMIN B-12,) 1000 MCG/ML injection  . dolutegravir (TIVICAY) 50 MG tablet  . Emollient (MINERIN) LOTN  . famotidine (PEPCID) 20 MG tablet  . fentaNYL (DURAGESIC) 25 MCG/HR  . lamivudine (EPIVIR) 100 MG tablet  . Lidocaine HCl 4 % CREA  . lidocaine-prilocaine (EMLA) cream  . loratadine (CLARITIN) 10 MG tablet  . melatonin 3 MG TABS tablet  . methadone (DOLOPHINE) 5 MG tablet  . Multiple Vitamin (MULTIVITAMIN WITH MINERALS) TABS tablet  . nitroGLYCERIN (NITROSTAT) 0.4 MG SL tablet  . Oxycodone HCl 10  MG TABS  . promethazine (PHENERGAN) 12.5 MG tablet  . venlafaxine (EFFEXOR) 25 MG tablet  . vitamin C (ASCORBIC ACID) 500 MG tablet  . zidovudine (RETROVIR) 100 MG capsule   . 0.9 %  sodium chloride infusion (Manually program via Guardrails IV Fluids)  . acetaminophen (TYLENOL) tablet 650 mg  . diphenhydrAMINE (BENADRYL) capsule 25 mg  . sodium chloride flush (NS) 0.9 % injection 10 mL    Myra Gianotti, PA-C Surgical Short Stay/Anesthesiology [facility] Phone [phone] [facility] Phone [phone] 07/10/2020 4:17 PM

## 2020-07-10 NOTE — Progress Notes (Signed)
Patient resides at [facility] [phone]. Spoke with Theadora Rama, DON for PAT information.     PCP - Dr Tessie Fass  Cardiologist - Dr Dorris Carnes ID - Dr Bobby Rumpf Hem-Oncology - Dr Derek Jack  Chest x-ray - 07/19/19 (1V) EKG - 09/29/19 Stress Test - n/a ECHO - 02/21/15 Cardiac Cath - 09/16/12  Blood Thinner Instructions:  Last dose of plavix was on 07/06/20.     Anesthesia review: Yes  STOP now taking any Aspirin (unless otherwise instructed by your surgeon), Aleve, Naproxen, Ibuprofen, Motrin, Advil, Goody's, BC's, all herbal medications, fish oil, and all vitamins.   Coronavirus Screening Covid test on 07/08/20 was negative.  Faxed DOS instructions to [phone]  attn: Brandy, DON.

## 2020-07-11 ENCOUNTER — Encounter (HOSPITAL_COMMUNITY)
Admission: RE | Disposition: A | Discharge status: Home / Self Care | Payer: Self-pay | Source: Home / Self Care | Attending: Gastroenterology

## 2020-07-11 ENCOUNTER — Encounter (HOSPITAL_COMMUNITY): Payer: Self-pay | Admitting: Gastroenterology

## 2020-07-11 ENCOUNTER — Ambulatory Visit (HOSPITAL_COMMUNITY)
Admission: RE | Admit: 2020-07-11 | Discharge: 2020-07-11 | Disposition: A | Discharge status: Home / Self Care | Payer: Medicare Other | Attending: Gastroenterology | Admitting: Gastroenterology

## 2020-07-11 ENCOUNTER — Ambulatory Visit (HOSPITAL_COMMUNITY): Payer: Medicare Other | Admitting: Physician Assistant

## 2020-07-11 DIAGNOSIS — Z91040 Latex allergy status: Secondary | ICD-10-CM | POA: Diagnosis not present

## 2020-07-11 DIAGNOSIS — Z79891 Long term (current) use of opiate analgesic: Secondary | ICD-10-CM | POA: Diagnosis not present

## 2020-07-11 DIAGNOSIS — K219 Gastro-esophageal reflux disease without esophagitis: Secondary | ICD-10-CM | POA: Diagnosis not present

## 2020-07-11 DIAGNOSIS — K838 Other specified diseases of biliary tract: Secondary | ICD-10-CM | POA: Diagnosis not present

## 2020-07-11 DIAGNOSIS — Z880 Allergy status to penicillin: Secondary | ICD-10-CM | POA: Diagnosis not present

## 2020-07-11 DIAGNOSIS — N186 End stage renal disease: Secondary | ICD-10-CM | POA: Insufficient documentation

## 2020-07-11 DIAGNOSIS — Z8673 Personal history of transient ischemic attack (TIA), and cerebral infarction without residual deficits: Secondary | ICD-10-CM | POA: Diagnosis not present

## 2020-07-11 DIAGNOSIS — K8020 Calculus of gallbladder without cholecystitis without obstruction: Secondary | ICD-10-CM | POA: Diagnosis not present

## 2020-07-11 DIAGNOSIS — K317 Polyp of stomach and duodenum: Secondary | ICD-10-CM

## 2020-07-11 DIAGNOSIS — K8689 Other specified diseases of pancreas: Secondary | ICD-10-CM | POA: Diagnosis not present

## 2020-07-11 DIAGNOSIS — Z7902 Long term (current) use of antithrombotics/antiplatelets: Secondary | ICD-10-CM | POA: Insufficient documentation

## 2020-07-11 DIAGNOSIS — Z79899 Other long term (current) drug therapy: Secondary | ICD-10-CM | POA: Diagnosis not present

## 2020-07-11 DIAGNOSIS — D631 Anemia in chronic kidney disease: Secondary | ICD-10-CM | POA: Insufficient documentation

## 2020-07-11 DIAGNOSIS — K449 Diaphragmatic hernia without obstruction or gangrene: Secondary | ICD-10-CM | POA: Diagnosis not present

## 2020-07-11 DIAGNOSIS — K2950 Unspecified chronic gastritis without bleeding: Secondary | ICD-10-CM | POA: Diagnosis not present

## 2020-07-11 DIAGNOSIS — K2970 Gastritis, unspecified, without bleeding: Secondary | ICD-10-CM | POA: Diagnosis not present

## 2020-07-11 DIAGNOSIS — Z21 Asymptomatic human immunodeficiency virus [HIV] infection status: Secondary | ICD-10-CM | POA: Insufficient documentation

## 2020-07-11 DIAGNOSIS — R935 Abnormal findings on diagnostic imaging of other abdominal regions, including retroperitoneum: Secondary | ICD-10-CM

## 2020-07-11 DIAGNOSIS — Z992 Dependence on renal dialysis: Secondary | ICD-10-CM | POA: Insufficient documentation

## 2020-07-11 DIAGNOSIS — I120 Hypertensive chronic kidney disease with stage 5 chronic kidney disease or end stage renal disease: Secondary | ICD-10-CM | POA: Insufficient documentation

## 2020-07-11 DIAGNOSIS — Z87891 Personal history of nicotine dependence: Secondary | ICD-10-CM | POA: Insufficient documentation

## 2020-07-11 DIAGNOSIS — K3189 Other diseases of stomach and duodenum: Secondary | ICD-10-CM | POA: Insufficient documentation

## 2020-07-11 DIAGNOSIS — K859 Acute pancreatitis without necrosis or infection, unspecified: Secondary | ICD-10-CM

## 2020-07-11 HISTORY — PX: POLYPECTOMY: SHX5525

## 2020-07-11 HISTORY — PX: EUS: SHX5427

## 2020-07-11 HISTORY — PX: HEMOSTASIS CLIP PLACEMENT: SHX6857

## 2020-07-11 HISTORY — PX: BIOPSY: SHX5522

## 2020-07-11 HISTORY — PX: ESOPHAGOGASTRODUODENOSCOPY (EGD) WITH PROPOFOL: SHX5813

## 2020-07-11 LAB — HEPATIC FUNCTION PANEL
ALT: 6 U/L (ref 0–44)
AST: 27 U/L (ref 15–41)
Albumin: 3.5 g/dL (ref 3.5–5.0)
Alkaline Phosphatase: 82 U/L (ref 38–126)
Bilirubin, Direct: 0.3 mg/dL — ABNORMAL HIGH (ref 0.0–0.2)
Indirect Bilirubin: 0.5 mg/dL (ref 0.3–0.9)
Total Bilirubin: 0.8 mg/dL (ref 0.3–1.2)
Total Protein: 7.9 g/dL (ref 6.5–8.1)

## 2020-07-11 LAB — POCT I-STAT, CHEM 8
BUN: 57 mg/dL — ABNORMAL HIGH (ref 8–23)
Calcium, Ion: 1.12 mmol/L — ABNORMAL LOW (ref 1.15–1.40)
Chloride: 100 mmol/L (ref 98–111)
Creatinine, Ser: 9.7 mg/dL — ABNORMAL HIGH (ref 0.44–1.00)
Glucose, Bld: 79 mg/dL (ref 70–99)
HCT: 30 % — ABNORMAL LOW (ref 36.0–46.0)
Hemoglobin: 10.2 g/dL — ABNORMAL LOW (ref 12.0–15.0)
Potassium: 5.1 mmol/L (ref 3.5–5.1)
Sodium: 138 mmol/L (ref 135–145)
TCO2: 33 mmol/L — ABNORMAL HIGH (ref 22–32)

## 2020-07-11 SURGERY — UPPER ENDOSCOPIC ULTRASOUND (EUS) RADIAL
Anesthesia: Monitor Anesthesia Care

## 2020-07-11 MED ORDER — CLOPIDOGREL BISULFATE 75 MG PO TABS: 75.0000 mg | ORAL_TABLET | Freq: Every day | ORAL | Status: DC

## 2020-07-11 MED ORDER — OMEPRAZOLE 40 MG PO CPDR
40.0000 mg | DELAYED_RELEASE_CAPSULE | Freq: Every day | ORAL | 6 refills | Status: DC
Start: 2020-07-11 — End: 2020-08-05

## 2020-07-11 MED ORDER — PROPOFOL 500 MG/50ML IV EMUL
INTRAVENOUS | Status: DC | PRN
  Administered 2020-07-11: 100 ug/kg/min via INTRAVENOUS

## 2020-07-11 MED ORDER — SODIUM CHLORIDE 0.9 % IV SOLN: INTRAVENOUS | Status: DC

## 2020-07-11 MED ORDER — PROPOFOL 10 MG/ML IV BOLUS
INTRAVENOUS | Status: DC | PRN
  Administered 2020-07-11: 40 mg via INTRAVENOUS

## 2020-07-11 MED ORDER — LIDOCAINE HCL (PF) 2 % IJ SOLN
INTRAMUSCULAR | Status: DC | PRN
  Administered 2020-07-11: 40 mg via INTRADERMAL

## 2020-07-11 NOTE — Anesthesia Postprocedure Evaluation (Signed)
Anesthesia Post Note  Patient: AIMAR SHREWSBURY  Procedure(s) Performed: UPPER ENDOSCOPIC ULTRASOUND (EUS) RADIAL (N/A ) POLYPECTOMY BIOPSY HEMOSTASIS CLIP PLACEMENT     Patient location during evaluation: PACU Anesthesia Type: MAC Level of consciousness: awake and alert and oriented Pain management: pain level controlled Vital Signs Assessment: post-procedure vital signs reviewed and stable Respiratory status: spontaneous breathing, nonlabored ventilation and respiratory function stable Cardiovascular status: stable and blood pressure returned to baseline Postop Assessment: no apparent nausea or vomiting Anesthetic complications: no   No complications documented.  Last Vitals:  Vitals:   07/11/20 1111 07/11/20 1121  BP: (!) 184/91 (!) 197/108  Pulse: 69 71  Resp: 19 16  Temp:    SpO2: 97% 96%    Last Pain:  Vitals:   07/11/20 1121  TempSrc:   PainSc: 0-No pain                 Atzel Mccambridge A.

## 2020-07-11 NOTE — Op Note (Addendum)
[facility] Patient Name: [name] Procedure Date : 07/11/2020 MRN: [id number] Attending MD: Justice Britain , MD Date of Birth: 05-08-1950 CSN: [id number] Age: 70 Admit Type: Outpatient Procedure:                Upper EUS Indications:              Common bile duct dilation (etiology unknown) seen                            on CT scan, Dilated pancreatic duct on CT scan,                            Common bile duct dilation (acquired) seen on MRCP,                            Dilated pancreatic duct on MRCP Providers:                Justice Britain, MD, Kary Kos RN, RN, Elspeth Cho Tech., Technician Referring MD:             Hildred Laser, MD, [name] Medicines:                Monitored Anesthesia Care Complications:            No immediate complications. Estimated Blood Loss:     Estimated blood loss was minimal. Procedure:                Pre-Anesthesia Assessment:                           - Prior to the procedure, a History and Physical                            was performed, and patient medications and                            allergies were reviewed. The patient's tolerance of                            previous anesthesia was also reviewed. The risks                            and benefits of the procedure and the sedation                            options and risks were discussed with the patient.                            All questions were answered, and informed consent                            was obtained. Prior Anticoagulants: The patient has  taken Plavix (clopidogrel), last dose was 5 days                            prior to procedure. ASA Grade Assessment: III - A                            patient with severe systemic disease. After                            reviewing the risks and benefits, the patient was                            deemed in satisfactory condition to  undergo the                            procedure.                           After obtaining informed consent, the endoscope was                            passed under direct vision. Throughout the                            procedure, the patient's blood pressure, pulse, and                            oxygen saturations were monitored continuously. The                            [id number]-H190 ([id number]) Olympus gastroscope was                            introduced through the mouth, and advanced to the                            second part of duodenum. The [id number]-[id number] ([id number])                            Olympus Duodenoscope was introduced through the                            mouth, and advanced to the second part of duodenum.                            The [id number] ([id number]) Olympus Linear EUS scope                            was introduced through the mouth, and advanced to                            the duodenum for ultrasound examination from the  stomach and duodenum. The TGF-[id number] ([id number])                            Olympus forward view EUS scope was introduced                            through the mouth, and advanced to the duodenum for                            ultrasound examination from the stomach and                            duodenum. The upper EUS was somewhat difficult due                            to narrowing. Successful completion of the                            procedure was aided by performing the maneuvers                            documented (below) in this report. The patient                            tolerated the procedure. Scope In: Scope Out: Findings:      ENDOSCOPIC FINDING: :      No gross lesions were noted in the entire esophagus.      The Z-line was regular and was found 35 cm from the incisors.      A 4 cm hiatal hernia was present.      Patchy moderate inflammation characterized by erosions, erythema and        granularity was found in the entire examined stomach. Biopsies were       taken with a cold forceps for histology and Helicobacter pylori testing.      A single 8 mm sessile polyp with no bleeding was found in the third       portion of the duodenum. The polyp was removed with a cold snare.       Resection and retrieval were complete. To prevent bleeding after the       polypectomy, two hemostatic clips were successfully placed (MR       conditional). There was no bleeding at the end of the procedure.      A benign-appearing, intrinsic moderate narrowing/angulation was found in       the D1/D2 angle-sweep. It was quite difficult to pass this region with       the adult endoscope and duodenoscope. I could not get the Linear EUS or       the Forward viewing EUS to pass this region however.      No other gross mucosal lesions were noted in the duodenal bulb, in the       first portion of the duodenum and in the second portion of the duodenum.      The major papilla was normal but has what appears to be an elongated       intraduodenal portion.      ENDOSONOGRAPHIC FINDING: :  There was dilation in the common bile duct (3.5 mm -> 7.4 mm) and in the       common hepatic duct (10 mm). No evidence of any stones or sludge were       noted in the bile duct itself at this time however.      Stones were visualized endosonographically in the gallbladder. There was       an area of biliary sludge and a hyperechoic and shadowing stone noted.       The gallbladder wall was not thickened (0.6 mm in size).      Pancreatic parenchymal abnormalities were noted in the entire pancreas.       These consisted of hyperechoic strands.      The pancreatic duct had a dilated endosonographic appearance (PDH = 2.1       mm -> 3.8 mm, PDN = 5.9 mm -> 4.0 mm -> 3.3 mm, PDB = 3.7 mm, PDT = 2.7       mm) and had a prominence of branch duct dilation in the neck and body       and tail region.      Endosonographic  imaging in the entire pancreas showed no mass-lesion.      Endosonographic imaging of the ampulla showed no mass-lesion.      Endosonographic imaging in the visualized portion of the liver showed no       mass.      No malignant-appearing lymph nodes were visualized in the celiac region       (level 20), peripancreatic region and porta hepatis region.      The celiac region was visualized. Impression:               EGD Impression:                           - No gross lesions in esophagus. Z-line regular, 35                            cm from the incisors.                           - 4 cm hiatal hernia.                           - Gastritis. Biopsied.                           - A single duodenal polyp. Resected and retrieved.                            Clips (MR conditional) were placed.                           - Duodenal narrowing at the duodenal sweep.                           - No gross mucosal lesions in the duodenal bulb, in                            the first portion of the duodenum and in the  second                            portion of the duodenum otherwise.                           - Normal major papilla with longer intraduodenal                            portion.                           EUS Impression:                           - There was dilation in the common bile duct and in                            the common hepatic duct. No stones or sludge within                            the bile duct.                           - Stone and biliary sludge were visualized                            endosonographically in the gallbladder.                           - Pancreatic parenchymal abnormalities consisting                            of hyperechoic strands were noted in the entire                            pancreas.                           - The pancreatic duct had a dilated endosonographic                            appearance and had prominent side-branches in the                             neck and body and tail of the pancreas.                           - No pancreatic mass appreciated on today's                            examination, though ability to visualize the distal                            head and uncinate was not possible due to the  angulation in the D1/D2 sweep that is noted above.                           - Ampullary lesion not appreciated endoscopically                            or endosonographically.                           - No malignant-appearing lymph nodes were                            visualized in the celiac region (level 20),                            peripancreatic region and porta hepatis region. Recommendation:           - The patient will be observed post-procedure,                            until all discharge criteria are met.                           - Discharge patient to a nursing home.                           - Patient has a contact number available for                            emergencies. The signs and symptoms of potential                            delayed complications were discussed with the                            patient. Return to normal activities tomorrow.                            Written discharge instructions were provided to the                            patient.                           - Resume previous diet.                           - Plavix restart on 5/15 to decrease risk of                            post-interventional bleeding from today's procedure.                           - May discontinue Famotidine for now and initiate  Omeprazole 40 mg daily or Pantoprazole 40 mg daily                            in interim.                           - Observe patient's clinical course.                           - Await path results.                           - Suspect that biliary dilitation is from chronic                             narcotics, though possibility of sludge/stone                            issues from gallbladder dropping could be had, if                            LFTs were to have significant change in the future                            in an obstructive vs cholestatic pattern.                           - Consider fecal elastase testing to evaluate for                            EPI.                           - Recommend repeat imaging in 6-12 months (as long                            as overall clinical status is stable) with MRI/MRCP                            imaging to evaluate if any progressive changes are                            noted with primary GI team.                           - If new onset of other symptoms develop, would                            recommend earlier follow up imaging and obtaining a                            CA19-9.                           - Secondary report printed for the patient's  nursing facility.                           - The findings and recommendations were discussed                            with the patient. Procedure Code(s):        --- Professional ---                           [phone], Esophagogastroduodenoscopy, flexible,                            transoral; with removal of tumor(s), polyp(s), or                            other lesion(s) by snare technique                           43237, Esophagogastroduodenoscopy, flexible,                            transoral; with endoscopic ultrasound examination                            limited to the esophagus, stomach or duodenum, and                            adjacent structures Diagnosis Code(s):        --- Professional ---                           K44.9, Diaphragmatic hernia without obstruction or                            gangrene                           K29.70, Gastritis, unspecified, without bleeding                           K31.7, Polyp of stomach and  duodenum                           K83.8, Other specified diseases of biliary tract                           K80.20, Calculus of gallbladder without                            cholecystitis without obstruction                           K86.9, Disease of pancreas, unspecified                           I89.9, Noninfective disorder of lymphatic vessels  and lymph nodes, unspecified                           K86.89, Other specified diseases of pancreas                           R93.3, Abnormal findings on diagnostic imaging of                            other parts of digestive tract                           R93.2, Abnormal findings on diagnostic imaging of                            liver and biliary tract CPT copyright 2019 [employer]. All rights reserved. The codes documented in this report are preliminary and upon coder review may  be revised to meet current compliance requirements. Justice Britain, MD 07/11/2020 11:24:19 AM Number of Addenda: 0

## 2020-07-11 NOTE — Transfer of Care (Signed)
Immediate Anesthesia Transfer of Care Note  Patient: [name]  Procedure(s) Performed: UPPER ENDOSCOPIC ULTRASOUND (EUS) RADIAL (N/A ) POLYPECTOMY BIOPSY HEMOSTASIS CLIP PLACEMENT  Patient Location: PACU  Anesthesia Type:MAC  Level of Consciousness: drowsy and patient cooperative  Airway & Oxygen Therapy: Patient Spontanous Breathing and Patient connected to nasal cannula oxygen  Post-op Assessment: Report given to RN and Post -op Vital signs reviewed and stable  Post vital signs: Reviewed and stable  Last Vitals:  Vitals Value Taken Time  BP 174/84 07/11/20 1052  Temp    Pulse 76 07/11/20 1053  Resp 27 07/11/20 1053  SpO2 94 % 07/11/20 1053  Vitals shown include unvalidated device data.  Last Pain:  Vitals:   07/11/20 0935  TempSrc: Temporal  PainSc: 0-No pain         Complications: No complications documented.

## 2020-07-11 NOTE — H&P (Signed)
GASTROENTEROLOGY PROCEDURE H&P NOTE   Primary Care Physician: Hilbert Corrigan, MD  HPI: [name] is a 70 y.o. female who presents for EGD/EUS to evaluate dilated biliary tree and abdominal [name].  Past Medical History:  Diagnosis Date  . Anemia   . Anxiety   . Atrial flutter ([place]) 02/22/2015  . C. difficile colitis 10/10/11  . Chronic diarrhea   . Chronic [name]   . Depression   . ESRD on hemodialysis [facility])    Dialysis [facility]  . GERD (gastroesophageal reflux disease)   . HIV (human immunodeficiency virus infection) ([place])   . Hypertension   . Insomnia   . Intracranial hemorrhage ([place])   . Muscle weakness (generalized)   . Pulmonary HTN ([place])   . Tachycardia   . TIA (transient ischemic attack)    [facility] do not have this dx  . Traumatic hematoma of right upper arm 02/22/2015   Past Surgical History:  Procedure Laterality Date  . A/V FISTULAGRAM N/A 04/17/2016   Procedure: A/V Fistulagram - Right Upper;  Surgeon: Waynetta Sandy, MD;  Location: [place] CV LAB;  Service: Cardiovascular;  Laterality: N/A;  . A/V FISTULAGRAM Right 08/28/2019   Procedure: A/V FISTULAGRAM;  Surgeon: Waynetta Sandy, MD;  Location: [place] CV [facility];  Service: Cardiovascular;  Laterality: Right;  . ABDOMINAL HYSTERECTOMY    . BIOPSY  01/02/2020   Procedure: BIOPSY;  Surgeon: Eloise Harman, DO;  [facility];  Service: Endoscopy;;  . BIOPSY THYROID    . CARPAL TUNNEL RELEASE Right 11/16/2019   Procedure: CARPAL TUNNEL RELEASE;  Surgeon: Leanora Cover, MD;  Location: [place];  Service: Orthopedics;  Laterality: Right;  . COLONOSCOPY WITH PROPOFOL N/A 01/02/2020   Procedure: COLONOSCOPY WITH PROPOFOL;  Surgeon: Eloise Harman, DO;  [facility];  Service: Endoscopy;  Laterality: N/A;  1:15pm-pt at [facility]  . Dialysis Shunts     previous one removed from left arm and now present one in right arm  .  DIALYSIS/PERMA CATHETER INSERTION Right 04/17/2016   Procedure: dialysis Catheter Insertion central veinous;  Surgeon: Waynetta Sandy, MD;  Location: [place] CV LAB;  Service: Cardiovascular;  Laterality: Right;  . ESOPHAGOGASTRODUODENOSCOPY  12/15/2010   [place]k: erosive reflux esophagitis, MW tear, hiatal hernia (small), gastritis with no h.pylori, possibly nsaid related  . EXCISION OF BREAST BIOPSY Right 05/04/2012   Procedure: EXCISION OF BREAST BIOPSY;  Surgeon: Donato Heinz, MD;  Location: [facility];  Service: General;  Laterality: Right;  Right Excisional Breast Biopsy  . FISTULOGRAM Right 03/26/2014   Procedure: Right Arm Fistulogram with Venoplasty Right Subclavian Vein and Inominate Vein. Debridement Fistula Ulcer;  Surgeon: Conrad [place], MD;  Location: [place];  Service: Vascular;  Laterality: Right;  . FISTULOGRAM Right 03/29/2017   Procedure: FISTULOGRAM COMPLEX RIGHT ARM with Balloon angioplasty;  Surgeon: Waynetta Sandy, MD;  Location: [place];  Service: Vascular;  Laterality: Right;  . IR GENERIC HISTORICAL  05/19/2016   IR REMOVAL TUN CV CATH W/O FL 05/19/2016 Markus Daft, MD [facility]-INTERV RAD  . PERIPHERAL VASCULAR BALLOON ANGIOPLASTY Right 04/17/2016   Procedure: Peripheral Vascular Balloon Angioplasty;  Surgeon: Waynetta Sandy, MD;  Location: [place] [facility];  Service: Cardiovascular;  Laterality: Right;  Central segment AV Fistula  . PERIPHERAL VASCULAR BALLOON ANGIOPLASTY Right 03/14/2018   Procedure: PERIPHERAL VASCULAR BALLOON ANGIOPLASTY;  Surgeon: Waynetta Sandy, MD;  Location: [place]  CV LAB;  Service: Cardiovascular;  Laterality: Right;  subclavian vein  . PERIPHERAL VASCULAR BALLOON ANGIOPLASTY Right 08/28/2019   Procedure: PERIPHERAL VASCULAR BALLOON ANGIOPLASTY;  Surgeon: Waynetta Sandy, MD;  Location: [place] CV [facility];  Service: Cardiovascular;  Laterality: Right;  upper arm  . PERIPHERAL VASCULAR INTERVENTION Right  03/14/2018   Procedure: PERIPHERAL VASCULAR INTERVENTION;  Surgeon: Waynetta Sandy, MD;  Location: [place] CV LAB;  Service: Cardiovascular;  Laterality: Right;  subclavian vein  . SHUNTOGRAM Right 10/19/2013   Procedure: FISTULOGRAM;  Surgeon: Conrad Hutto, MD;  Location[facility] C[facility] LAB;  Service: Cardiovascular;  Laterality: Right;  . TONSILLECTOMY    . VISCERAL VENOGRAPHY Right 03/14/2018   Procedure: CENTRAL VENOGRAPHY;  Surgeon: Waynetta Sandy, MD;  Location: [place] CV LAB;  Service: Cardiovascular;  Laterality: Right;  upper ext fistula   Current Facility-Administered Medications  Medication Dose Route Frequency Provider Last Rate Last Admin  . 0.9 %  sodium chloride infusion   Intravenous Continuous Mansouraty, Telford Nab., MD       Facility-Administered Medications Ordered in Other Encounters  Medication Dose Route Frequency Provider Last Rate Last Admin  . 0.9 %  sodium chloride infusion (Manually program via Guardrails IV Fluids)  250 mL Intravenous Once Lockamy, Randi L, NP-C      . acetaminophen (TYLENOL) tablet 650 mg  650 mg Oral Once Lockamy, Randi L, NP-C      . diphenhydrAMINE (BENADRYL) capsule 25 mg  25 mg Oral Once Lockamy, Randi L, NP-C      . sodium chloride flush (NS) 0.9 % injection 10 mL  10 mL Intracatheter PRN Lockamy, Randi L, NP-C       Allergies  Allergen Reactions  . Lactose Intolerance (Gi) Other (See Comments)    On MAR  . Latex Rash and Itching  . Penicillins Rash and Swelling    Has patient had a PCN reaction causing immediate rash, facial/tongue/throat swelling, SOB or lightheadedness with hypotension: No Has patient had a PCN reaction causing severe rash involving mucus membranes or skin necrosis: No Has patient had a PCN reaction that required hospitalization No Has patient had a PCN reaction occurring within the last 10 years: No If all of the above answers are "NO", then may proceed with Cephalosporin use.     Family  History  Problem Relation Age of Onset  . Anesthesia problems Neg Hx   . Hypotension Neg Hx   . Malignant hyperthermia Neg Hx   . Pseudochol deficiency Neg Hx   . Colon cancer Neg Hx        Unsure of parents who both died when she was a baby   Social History   Socioeconomic History  . Marital status: Widowed    Spouse name: Not on file  . Number of children: 1  . Years of education: Not on file  . Highest education level: Not on file  Occupational History  . Not on file  Tobacco Use  . Smoking status: Former Smoker    Types: Cigarettes    Quit date: 03/12/1983    Years since quitting: 37.3  . Smokeless tobacco: Never Used  Vaping Use  . Vaping Use: Never used  Substance and Sexual Activity  . Alcohol use: No    Alcohol/week: 0.0 standard drinks  . Drug use: No  . Sexual activity: Not Currently    Birth control/protection: Surgical    Comment: Hysterectomy  Other Topics Concern  . Not on file  Social History Narrative  . Not on file   Social Determinants of Health   Financial Resource Strain: Not on file  Food Insecurity: Not on file  Transportation Needs: Not on file  Physical Activity: Not on file  Stress: Not on file  Social Connections: Not on file  Intimate Partner Violence: Not on file    Physical Exam: Vital signs in last 24 hours: Weight:  [44.9 kg] 44.9 kg (05/11 1746)   GEN: NAD EYE: Sclerae anicteric ENT: MMM CV: Non-tachycardic GI: Soft, TTP in midabdomen NEURO:  Alert & Oriented x 3  Lab Results: No results for input(s): WBC, HGB, HCT, PLT in the last 72 hours. BMET No results for input(s): NA, K, CL, CO2, GLUCOSE, BUN, CREATININE, CALCIUM in the last 72 hours. LFT No results for input(s): PROT, ALBUMIN, AST, ALT, ALKPHOS, BILITOT, BILIDIR, IBILI in the last 72 hours. PT/INR No results for input(s): LABPROT, INR in the last 72 hours.   Impression / Plan: This is a 70 y.o.female who presents for EGD/EUS to evaluate dilated biliary tree  and abdominal [name].  The risks of an EUS including intestinal perforation, bleeding, infection, aspiration, and medication effects were discussed as was the possibility it may not give a definitive diagnosis if a biopsy is performed.  When a biopsy of the pancreas is done as part of the EUS, there is an additional risk of pancreatitis at the rate of about 1-2%.  It was explained that procedure related pancreatitis is typically mild, although it can be severe and even life threatening, which is why we do not perform random pancreatic biopsies and only biopsy a lesion/area we feel is concerning enough to warrant the risk.  The risks and benefits of endoscopic evaluation were discussed with the patient; these include but are not limited to the risk of perforation, infection, bleeding, missed lesions, lack of diagnosis, severe illness requiring hospitalization, as well as anesthesia and sedation related illnesses.  The patient is agreeable to proceed.    Justice Britain, MD [facility] Office # [phone]

## 2020-07-11 NOTE — Discharge Instructions (Signed)
YOU HAD AN ENDOSCOPIC PROCEDURE TODAY: Refer to the procedure report and other information in the discharge instructions given to you for any specific questions about what was found during the examination. If this information does not answer your questions, please call [place] office at [phone] to clarify.   YOU SHOULD EXPECT: Some feelings of bloating in the abdomen. Passage of more gas than usual. Walking can help get rid of the air that was put into your GI tract during the procedure and reduce the bloating. If you had a lower endoscopy (such as a colonoscopy or flexible sigmoidoscopy) you may notice spotting of blood in your stool or on the toilet paper. Some abdominal soreness may be present for a day or two, also.  DIET: Your first meal following the procedure should be a light meal and then it is ok to progress to your normal diet. A half-sandwich or bowl of soup is an example of a good first meal. Heavy or fried foods are harder to digest and may make you feel nauseous or bloated. Drink plenty of fluids but you should avoid alcoholic beverages for 24 hours. If you had a esophageal dilation, please see attached instructions for diet.    ACTIVITY: Your care partner should take you home directly after the procedure. You should plan to take it easy, moving slowly for the rest of the day. You can resume normal activity the day after the procedure however YOU SHOULD NOT DRIVE, use power tools, machinery or perform tasks that involve climbing or major physical exertion for 24 hours (because of the sedation medicines used during the test).   SYMPTOMS TO REPORT IMMEDIATELY: A gastroenterologist can be reached at any hour. Please call [phone]  for any of the following symptoms:   Following upper endoscopy (EGD, EUS, ERCP, esophageal dilation) Vomiting of blood or coffee ground material  New, significant abdominal pain  New, significant chest pain or pain under the shoulder blades  Painful or  persistently difficult swallowing  New shortness of breath  Black, tarry-looking or red, bloody stools  FOLLOW UP:  If any biopsies were taken you will be contacted by phone or by letter within the next 1-3 weeks. Call [phone]  if you have not heard about the biopsies in 3 weeks.  Please also call with any specific questions about appointments or follow up tests.  

## 2020-07-12 LAB — SURGICAL PATHOLOGY

## 2020-07-14 ENCOUNTER — Encounter (HOSPITAL_COMMUNITY): Payer: Self-pay | Admitting: Gastroenterology

## 2020-07-15 ENCOUNTER — Encounter: Payer: Self-pay | Admitting: Gastroenterology

## 2020-07-23 NOTE — Progress Notes (Signed)
Reviewed. Will see patient in follow-up on 6/6 as scheduled.

## 2020-08-03 NOTE — Progress Notes (Signed)
Referring Provider: Hilbert Corrigan* Primary Care Physician:  Hilbert Corrigan, MD Primary GI Physician: Dr. Abbey Chatters  Chief Complaint  Patient presents with  . Follow-up    HPI:   [name] is a 70 y.o. female with history of ESRD on hemodialysis, HIV, hypertension, chronic pain syndrome residing in a nursing home. Also with history of severe colitis in August 2021 treated with cipro and flagyl. Follow-up colonoscopy 01/02/2020 with nonbleeding internal hemorrhoids, diverticulosis, status post biopsies of the entire colon (benign). Due for repeat in 2026.  More recently undergoing evaluation biliary ductal dilation and pancreatic duct dilation.  She is presenting today for follow-up s/p EUS.  At the time of her last visit on 05/15/2020, she reported no significant abdominal pain other than cramping on the days of dialysis along with nausea/vomiting.  Reported stool consistency varied from mushy to formed/hard.  No diarrhea.  Overall, felt patient was essentially asymptomatic to biliary and pancreatic duct dilation which had been recently evaluated by MRI/MRCP in December 2021.  There was no obvious calculus or obstructing lesion on the MRI.  Redemonstrated severe intra and extrahepatic biliary ductal dilation to the ampulla, CBD measuring at least 1.4 cm, pancreatic duct diffusely dilated to the ampulla measuring up to 7 mm.  Gallbladder was mildly distended with sludge and small stones but no acute cholecystitis.  Radiologist recommended ERCP to evaluate for ampullary lesion or stricture.  Case was discussed with Dr. Laural Golden who recommended LFTs, and if normal, plan EUS; otherwise plan ERCP in [facility] with Dr. Rush Landmark.   LFTs were normal.   EGD/EUS 07/11/2020: EGD Impression: - Normal esophagus - 4 cm hiatal hernia. - Gastritis. Biopsied (mild chronic gastritis, negative H. pylori). - A single duodenal polyp. Resected and retrieved. Clips (MR conditional) were  placed.  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  Duodenal biopsy benign.) - Duodenal narrowing at the duodenal sweep. - No gross mucosal lesions in the duodenal bulb, in the first portion of the duodenum and in the second portion of the duodenum otherwise. - Normal major papilla with longer intraduodenal portion. EUS Impression: - There was dilation in the common bile duct and in the common hepatic duct. No stones or sludge within the bile duct. - Stone and biliary sludge were visualized endosonographically in the gallbladder. - Pancreatic parenchymal abnormalities consisting of hyperechoic strands were noted in the entire pancreas. - The pancreatic duct had a dilated endosonographic appearance and had prominent sidebranches in the neck and body and tail of the pancreas. - No pancreatic mass appreciated on today's examination, though ability to visualize the distal head and uncinate was not possible due to the angulation in the D1/D2 sweep that is noted above. - Ampullary lesion not appreciated endoscopically or endosonographically. - No malignant-appearing lymph nodes were visualized in the celiac region (level 20), peripancreatic region and porta hepatis region.  Recommendations: - Discontinue famotidine and start omeprazole or pantoprazole 40 mg daily. - Suspect that biliary dilitation is from chronic narcotics, though possibility of sludge/stone issues from gallbladder dropping could be had, if LFTs were to have significant change in the future in an obstructive vs cholestatic pattern. - Consider fecal elastase testing to evaluate for EPI. - Recommend repeat imaging in 6-12 months (as long as overall clinical status is stable) with MRI/MRCP imaging to evaluate if any progressive changes are noted with primary GI team. - If new onset of other symptoms develop, would recommend earlier follow up imaging and obtaining a CA19-9.  Today: Continues with intermittent  nausea/vomiting and intermittent epigastric pain.  This seems to be associated with dialysis.  She does tell me symptoms can occur on nondialysis days.  May occur before or after meals.  Usually, pain only lasts a few minutes.  Unable to give me any specific triggers.  Overall, feels this may have improved somewhat since her last procedure in May and starting omeprazole daily.   No significant GERD symptoms.   No blood in the stools or black stools. No constipation or diarrhea. Bowels moving daily. Stools are formed.   Past Medical History:  Diagnosis Date  . Anemia   . Anxiety   . Atrial flutter ([place]) 02/22/2015  . C. difficile colitis 10/10/11  . Chronic diarrhea   . Chronic pain   . Depression   . ESRD on hemodialysis [facility])    Dialysis [facility]  . GERD (gastroesophageal reflux disease)   . HIV (human immunodeficiency virus infection) ([place])   . Hypertension   . Insomnia   . Intracranial hemorrhage ([place])   . Muscle weakness (generalized)   . Pulmonary HTN ([place])   . Tachycardia   . TIA (transient ischemic attack)    [facility] do not have this dx  . Traumatic hematoma of right upper arm 02/22/2015    Past Surgical History:  Procedure Laterality Date  . A/V FISTULAGRAM N/A 04/17/2016   Procedure: A/V Fistulagram - Right Upper;  Surgeon: Waynetta Sandy, MD;  Location: [place] CV LAB;  Service: Cardiovascular;  Laterality: N/A;  . A/V FISTULAGRAM Right 08/28/2019   Procedure: A/V FISTULAGRAM;  Surgeon: Waynetta Sandy, MD;  Location: [place] CV LAB;  Service: Cardiovascular;  Laterality: Right;  . ABDOMINAL HYSTERECTOMY    . BIOPSY  01/02/2020   Procedure: BIOPSY;  Surgeon: Eloise Harman, DO;  [facility];  Service: Endoscopy;;  . BIOPSY  07/11/2020   Procedure: BIOPSY;  Surgeon: Irving Copas., MD;  Location: [place];  Service: Gastroenterology;;  . BIOPSY THYROID    . CARPAL TUNNEL RELEASE Right  11/16/2019   Procedure: CARPAL TUNNEL RELEASE;  Surgeon: Leanora Cover, MD;  Location: [place];  Service: Orthopedics;  Laterality: Right;  . COLONOSCOPY WITH PROPOFOL N/A 01/02/2020   Procedure: COLONOSCOPY WITH PROPOFOL;  Surgeon: Eloise Harman, DO;  [facility];  Service: Endoscopy;  Laterality: N/A;  1:15pm-pt at [facility]  . Dialysis Shunts     previous one removed from left arm and now present one in right arm  . DIALYSIS/PERMA CATHETER INSERTION Right 04/17/2016   Procedure: dialysis Catheter Insertion central veinous;  Surgeon: Waynetta Sandy, MD;  Location: [place] CV LAB;  Service: Cardiovascular;  Laterality: Right;  . ESOPHAGOGASTRODUODENOSCOPY  12/15/2010   [place]: erosive reflux esophagitis, MW tear, hiatal hernia (small), gastritis with no h.pylori, possibly nsaid related  . ESOPHAGOGASTRODUODENOSCOPY (EGD) WITH PROPOFOL N/A 07/11/2020    Surgeon: Irving Copas., MD; normal esophagus, 4 cm hiatal hernia, gastritis biopsied (mild chronic gastritis, negative H. pylori), single duodenal polyp (Brunner's gland hyperplasia), s/p duodenal biopsy benign, duodenal narrowing at the duodenal sweep.  . EUS N/A 07/11/2020    Surgeon: Irving Copas., MD;  dilated CBD and common hepatic duct without stones or sludge in the bile duct, stones and biliary sludge in the gallbladder, pancreatic parenchyma with hyperechoic strands, pancreatic duct dilated with prominent sidebranches in the neck and body and tail, no pancreatic mass though visulalization limited, no  ampullary lesion, no malignant appearing lymph node  . EXCISION OF BREAST BIOPSY Right 05/04/2012   Procedure: EXCISION OF BREAST BIOPSY;  Surgeon: Donato Heinz, MD;  Location: [facility];  Service: General;  Laterality: Right;  Right Excisional Breast Biopsy  . FISTULOGRAM Right 03/26/2014   Procedure: Right Arm Fistulogram with Venoplasty Right Subclavian Vein and Inominate Vein. Debridement  Fistula Ulcer;  Surgeon: Conrad Clearwater, MD;  Location: [place];  Service: Vascular;  Laterality: Right;  . FISTULOGRAM Right 03/29/2017   Procedure: FISTULOGRAM COMPLEX RIGHT ARM with Balloon angioplasty;  Surgeon: Waynetta Sandy, MD;  Location: [place];  Service: Vascular;  Laterality: Right;  . HEMOSTASIS CLIP PLACEMENT  07/11/2020   Procedure: HEMOSTASIS CLIP PLACEMENT;  Surgeon: Irving Copas., MD;  Location: [place];  Service: Gastroenterology;;  . Everlean Alstrom GENERIC HISTORICAL  05/19/2016   IR REMOVAL TUN CV CATH W/O FL 05/19/2016 Markus Daft, MD [facility]-INTERV RAD  . PERIPHERAL VASCULAR BALLOON ANGIOPLASTY Right 04/17/2016   Procedure: Peripheral Vascular Balloon Angioplasty;  Surgeon: Waynetta Sandy, MD;  Location: [place] CV LAB;  Service: Cardiovascular;  Laterality: Right;  Central segment AV Fistula  . PERIPHERAL VASCULAR BALLOON ANGIOPLASTY Right 03/14/2018   Procedure: PERIPHERAL VASCULAR BALLOON ANGIOPLASTY;  Surgeon: Waynetta Sandy, MD;  Location: [place] CV LAB;  Service: Cardiovascular;  Laterality: Right;  subclavian vein  . PERIPHERAL VASCULAR BALLOON ANGIOPLASTY Right 08/28/2019   Procedure: PERIPHERAL VASCULAR BALLOON ANGIOPLASTY;  Surgeon: Waynetta Sandy, MD;  Location: [place] CV LAB;  Service: Cardiovascular;  Laterality: Right;  upper arm  . PERIPHERAL VASCULAR INTERVENTION Right 03/14/2018   Procedure: PERIPHERAL VASCULAR INTERVENTION;  Surgeon: Waynetta Sandy, MD;  Location: [place] CV LAB;  Service: Cardiovascular;  Laterality: Right;  subclavian vein  . POLYPECTOMY  07/11/2020   Procedure: POLYPECTOMY;  Surgeon: Mansouraty, Telford Nab., MD;  Location: [place];  Service: Gastroenterology;;  . SHUNTOGRAM Right 10/19/2013   Procedure: FISTULOGRAM;  Surgeon: Conrad Attica, MD;  Location: [facility] C[facility] LAB;  Service: Cardiovascular;  Laterality: Right;  . TONSILLECTOMY    . VISCERAL VENOGRAPHY Right 03/14/2018    Procedure: CENTRAL VENOGRAPHY;  Surgeon: Waynetta Sandy, MD;  Location: [place] CV LAB;  Service: Cardiovascular;  Laterality: Right;  upper ext fistula    Current Outpatient Medications  Medication Sig Dispense Refill  . Baclofen 5 MG TABS Take 5 mg by mouth daily as needed (muscle spasms).    . Biotin 5000 MCG TABS Take 500 mcg by mouth daily with breakfast.    . Calcium Acetate 667 MG TABS Take 1,334 mg by mouth in the morning, at noon, and at bedtime.    . clopidogrel (PLAVIX) 75 MG tablet Take 1 tablet (75 mg total) by mouth daily with breakfast. 30 tablet   . cyanocobalamin (,VITAMIN B-12,) 1000 MCG/ML injection Inject 1,000 mcg into the muscle every 30 (thirty) days. On the 28th of every month    . dolutegravir (TIVICAY) 50 MG tablet Take 50 mg by mouth daily in the afternoon.    Marland Kitchen Emollient (MINERIN) LOTN Apply 1 application topically every 12 (twelve) hours as needed (dry skin). Apply to feet and ankles    . fentaNYL (DURAGESIC) 25 MCG/HR Place 1 patch onto the skin every 3 (three) days.    Marland Kitchen lamivudine (EPIVIR) 100 MG tablet Take 50 mg by mouth daily. (0800)    . Lidocaine HCl 4 % CREA Apply 1 application topically every 12 (twelve) hours as needed (wrist  pain).    . lidocaine-prilocaine (EMLA) cream Apply 1 application topically See admin instructions. Apply to fistula site one hour before dialysis on Tuesday, Thursday, and Saturday.    . loratadine (CLARITIN) 10 MG tablet Take 10 mg by mouth daily.    . melatonin 3 MG TABS tablet Take 3 mg by mouth at bedtime.    . methadone (DOLOPHINE) 5 MG tablet Take 2.5 mg by mouth daily at 8 pm.     . Multiple Vitamin (MULTIVITAMIN WITH MINERALS) TABS tablet Take 1 tablet by mouth at bedtime. (2000)    . nitroGLYCERIN (NITROSTAT) 0.4 MG SL tablet Place 0.4 mg under the tongue every 5 (five) minutes x 3 doses as needed for chest pain.     . Oxycodone HCl 10 MG TABS Take 1 tablet (10 mg total) by mouth every 12 (twelve) hours as  needed (severe pain). 5 tablet 0  . pantoprazole (PROTONIX) 40 MG tablet Take 1 tablet (40 mg total) by mouth 2 (two) times daily before a meal. 60 tablet 3  . promethazine (PHENERGAN) 12.5 MG tablet Take 12.5 mg by mouth See admin instructions. Take 12.5 mg by mouth in the morning every Tuesday, Thursday and Saturday for nausea/vomiting and every 6 hours as needed for n/v    . venlafaxine (EFFEXOR) 25 MG tablet Take 25 mg by mouth daily.    . vitamin C (ASCORBIC ACID) 500 MG tablet Take 500 mg by mouth 2 (two) times daily. (0900 & 2100)    . zidovudine (RETROVIR) 100 MG capsule Take 100 mg by mouth 3 (three) times daily. (0900, 1300, & 2100)     No current facility-administered medications for this visit.   Facility-Administered Medications Ordered in Other Visits  Medication Dose Route Frequency Provider Last Rate Last Admin  . 0.9 %  sodium chloride infusion (Manually program via Guardrails IV Fluids)  250 mL Intravenous Once Lockamy, Randi L, NP-C      . acetaminophen (TYLENOL) tablet 650 mg  650 mg Oral Once Lockamy, Randi L, NP-C      . diphenhydrAMINE (BENADRYL) capsule 25 mg  25 mg Oral Once Lockamy, Randi L, NP-C      . sodium chloride flush (NS) 0.9 % injection 10 mL  10 mL Intracatheter PRN Lockamy, Randi L, NP-C        Allergies as of 08/05/2020 - Review Complete 08/05/2020  Allergen Reaction Noted  . Lactose intolerance (gi) Other (See Comments) 03/29/2017  . Latex Rash and Itching 03/09/2011  . Penicillins Rash and Swelling 04/02/2011    Family History  Problem Relation Age of Onset  . Anesthesia problems Neg Hx   . Hypotension Neg Hx   . Malignant hyperthermia Neg Hx   . Pseudochol deficiency Neg Hx   . Colon cancer Neg Hx        Unsure of parents who both died when she was a baby    Social History   Socioeconomic History  . Marital status: Widowed    Spouse name: Not on file  . Number of children: 1  . Years of education: Not on file  . Highest education  level: Not on file  Occupational History  . Not on file  Tobacco Use  . Smoking status: Former Smoker    Types: Cigarettes    Quit date: 03/12/1983    Years since quitting: 37.4  . Smokeless tobacco: Never Used  Vaping Use  . Vaping Use: Never used  Substance and Sexual Activity  .  Alcohol use: No    Alcohol/week: 0.0 standard drinks  . Drug use: No  . Sexual activity: Not Currently    Birth control/protection: Surgical    Comment: Hysterectomy  Other Topics Concern  . Not on file  Social History Narrative  . Not on file   Social Determinants of Health   Financial Resource Strain: Not on file  Food Insecurity: Not on file  Transportation Needs: Not on file  Physical Activity: Not on file  Stress: Not on file  Social Connections: Not on file    Review of Systems: Gen: Denies fever, chills, cold or black symptoms, presyncope, syncope. CV: Denies chest pain or palpitations. Resp: Denies dyspnea or cough. GI: See HPI Heme: See HPI  Physical Exam: BP (!) 174/116   Pulse 84   Temp (!) 97.5 F (36.4 C)   Ht 5\' 2"  (1.575 m)   Wt 101 lb 9.6 oz (46.1 kg)   LMP  (LMP Unknown)   BMI 18.58 kg/m  General:   Alert and oriented. No distress noted. Pleasant and cooperative.  Head:  Normocephalic and atraumatic. Eyes:  Conjuctiva clear without scleral icterus. Heart:  S1, S2 present without murmurs appreciated. Lungs:  Clear to auscultation bilaterally. No wheezes, rales, or rhonchi. No distress.  Abdomen:  +BS, soft, non-tender and non-distended. No rebound or guarding. No HSM or masses noted. Exam limited due to being in a wheel chair.  Msk:  Symmetrical without gross deformities. Normal posture. Extremities:  Without edema. Neurologic:  Alert and  oriented x4 Psych: Normal mood and affect.   Assessment: 70 year old female who resides at a [facility] facility with history of ESRD on hemodialysis, HIV, hypertension, chronic pain syndrome, nausea/vomiting, severe  colitis in August 2021, and noted dilated CBD and pancreatic duct at that time presenting today for follow-up s/p EGD and EUS.  Dilated biliary tree and pancreatic duct: Noted on prior CTs in 2021.  Underwent an MRI/MRCP December 2021 which redemonstrated severe intra and extrahepatic biliary ductal dilation to the ampulla, CBD measuring at least 1.4 cm, pancreatic duct diffusely dilated to the ampulla measuring up to 7 mm, gallbladder with sludge and stones without cholecystitis, no obvious calculus or obstructive lesion.  Underwent EUS with Dr. Rush Landmark 07/11/20 revealing dilated CBD and common hepatic duct without stones or sludge in the bile ducts, stone and biliary sludge in the gallbladder, pancreatic parenchymal abnormalities with hyperechoic strands in the entire pancreas, pancreatic duct dilated with prominent sidebranches in the neck, body, and tail, no pancreatic mass though visualization limited due to angulation in D1/D2, no ampullary lesion endoscopically or endosonographically, no malignant appearing lymph nodes. LFTs have remained within normal limits. Suspected to be secondary to chronic narcotics and less likely intermittent passage of stones or sludge. Recommended repeat MRI/MRCP in 6-12 months and fecal elastase to evaluate for EPI.   Clinically, patient continues to have intermittent nausea/vomiting discussed below, but this doesn't seem entirely consistent with biliary etiology. Denies diarrhea/loose stools/bloating to suggest EPI, though notably, patient is a poor historian. Will check fecal elastase as Dr. Rush Landmark recommended. Nic for MRI/MRCP in 6 months. Consider repeat imaging sooner with updated LFTs and CA 19-9 if clinical changes.  Pancreatic parenchymal abnormality: Hyperechoic strands noted in the entire pancreas on recent EUS.  Dr. Rush Landmark recommended fecal elastase to evaluate for EPI.  Nausea/vomiting: Difficult to obtain clear history on this, but patient reports  intermittent nausea with vomiting for several months which is often associated with epigastric pain on  dialysis days though she does note intermittent nausea/vomiting on nondialysis days as well.  Symptoms can occur before or after eating.  Not necessarily triggered by any certain foods.  Notes some improvement with starting omeprazole 40 mg daily.  Denies typical GERD symptoms.  Recent EGD 07/11/2020 with mild chronic gastritis and duodenal narrowing at the duodenal sweep.  Likely multifactorial.  Differentials include gastritis, atypical GERD, chronic narcotics, ?  Clinical significance of duodenal narrowing, biliary colic, gastroparesis.  Consider HIDA scan gastric emptying study; however, the studies will likely be altered due to chronic narcotic use.  We will start with increasing PPI to twice daily to see how she does as she has already noted some improvement with once daily PPI.  We will change omeprazole to pantoprazole due to interaction with Plavix.    Plan: 1.  Fecal elastase, stool. 2.  Stop omeprazole and start pantoprazole 40 mg twice daily 30 minutes before breakfast and dinner. 3.  Counseled on GERD diet/lifestyle.  Written instructions provided. 4.  Continue Phenergan as needed for nausea/vomiting. 5.  Place on recall for MRI/MRCP in 6 months for biliary and pancreatic duct dilation.  Consider repeating imaging sooner and updating LFTs and CA 19-9 if changes in clinical course. 6.  Follow-up in 2 to 3 months with Dr. Abbey Chatters.    Aliene Altes, PA-C [facility] Gastroenterology 08/05/2020

## 2020-08-05 ENCOUNTER — Ambulatory Visit (INDEPENDENT_AMBULATORY_CARE_PROVIDER_SITE_OTHER): Payer: Medicare Other | Admitting: Gastroenterology

## 2020-08-05 ENCOUNTER — Encounter: Payer: Self-pay | Admitting: Gastroenterology

## 2020-08-05 VITALS — BP 174/116 | HR 84 | Temp 97.5°F | Ht 62.0 in | Wt 101.6 lb

## 2020-08-05 DIAGNOSIS — K8689 Other specified diseases of pancreas: Secondary | ICD-10-CM | POA: Diagnosis not present

## 2020-08-05 DIAGNOSIS — K838 Other specified diseases of biliary tract: Secondary | ICD-10-CM

## 2020-08-05 DIAGNOSIS — K2970 Gastritis, unspecified, without bleeding: Secondary | ICD-10-CM | POA: Insufficient documentation

## 2020-08-05 DIAGNOSIS — Q453 Other congenital malformations of pancreas and pancreatic duct: Secondary | ICD-10-CM | POA: Insufficient documentation

## 2020-08-05 DIAGNOSIS — R112 Nausea with vomiting, unspecified: Secondary | ICD-10-CM | POA: Diagnosis not present

## 2020-08-05 MED ORDER — PANTOPRAZOLE SODIUM 40 MG PO TBEC: 40.0000 mg | DELAYED_RELEASE_TABLET | Freq: Two times a day (BID) | ORAL | 3 refills | Status: DC

## 2020-08-05 NOTE — Patient Instructions (Addendum)
Complete stool fecal elastase.  Stop omeprazole and start pantoprazole 40 mg twice daily 30 minutes before breakfast and dinner.  Follow a GERD diet:  Avoid fried, fatty, greasy, spicy, citrus foods. Avoid caffeine and carbonated beverages. Avoid chocolate. Try eating 4-6 small meals a day rather than 3 large meals. Do not eat within 3 hours of laying down. Prop head of bed up on wood or bricks to create a 6 inch incline.  Continue Phenergan as needed for nausea/vomiting.  Follow-up in 2-3 months with Dr. Abbey Chatters.   Aliene Altes, PA-C [facility] Gastroenterology

## 2020-08-05 NOTE — Progress Notes (Signed)
Cc'ed to pcp °

## 2020-08-15 ENCOUNTER — Ambulatory Visit: Payer: Medicare Other | Admitting: Nurse Practitioner

## 2020-09-20 ENCOUNTER — Ambulatory Visit: Payer: Medicare Other | Admitting: Infectious Diseases

## 2020-10-10 ENCOUNTER — Telehealth: Payer: Self-pay

## 2020-10-10 NOTE — Telephone Encounter (Signed)
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 Says it looks the best now that it has looked - but would like to schedule her an appointment. Says he doesn't think it's in danger of rupture in the next 2 weeks. Patient resides in a SNF. Placed on schedule for wound check.

## 2020-10-11 ENCOUNTER — Ambulatory Visit (INDEPENDENT_AMBULATORY_CARE_PROVIDER_SITE_OTHER): Payer: Medicare Other | Admitting: Infectious Diseases

## 2020-10-11 ENCOUNTER — Encounter: Payer: Self-pay | Admitting: Infectious Diseases

## 2020-10-11 ENCOUNTER — Other Ambulatory Visit: Payer: Self-pay

## 2020-10-11 DIAGNOSIS — N186 End stage renal disease: Secondary | ICD-10-CM | POA: Diagnosis not present

## 2020-10-11 DIAGNOSIS — B20 Human immunodeficiency virus [HIV] disease: Secondary | ICD-10-CM | POA: Diagnosis not present

## 2020-10-11 DIAGNOSIS — R112 Nausea with vomiting, unspecified: Secondary | ICD-10-CM | POA: Diagnosis not present

## 2020-10-11 DIAGNOSIS — I10 Essential (primary) hypertension: Secondary | ICD-10-CM | POA: Diagnosis not present

## 2020-10-11 DIAGNOSIS — Z992 Dependence on renal dialysis: Secondary | ICD-10-CM

## 2020-10-11 NOTE — Assessment & Plan Note (Signed)
She has HD tomorrow.  She is currently asx from her BP.  Will ask her nephrologist to f/u on her BP at HD.

## 2020-10-11 NOTE — Assessment & Plan Note (Signed)
This appears to be improving.  My great appreciation to GI.

## 2020-10-11 NOTE — Assessment & Plan Note (Addendum)
She is getting her ART at ALF.  Would check her labs today- she refuses. Will get at HD tomorrow.  No change in her rx at this time. Has gotten COVID vax at ALF. Will get flu vax there as well.  rtc in 9 months.

## 2020-10-11 NOTE — Progress Notes (Signed)
Subjective:    Patient ID: [name], female  DOB: 1950/09/12, 70 y.o.        MRN: [id number]   HPI 70 yo F with HIV+, ESRD, diastolic CHF, parox aflutter, prev normal cath at [facility] (2016).staying at SNF [facility]). She is on tivicay/zidovudine/epivir dosed for her HD.No problems with these.    She was in hospital 2-15 to 04-19-16 for occluded right subclavian vein, with stenosis of right innominate vein. Patient seen by vascular surgery, pt had ballon angioplasty of right subclavian vein and innominate vein.  Her RUE AVF was again repaired 03-29-17. Her fistula currently is working well but she believes it was recently infected and she has been getting anbx at HD.(10-11-20)  She had colonoscopy 01-2020: hemmerhoids, diverticulosis.  She had ERCR/EUS 07-11-2020 for biliary/pancreatic dilatation. This was felt to be due to narcotics.  She continues to have f/u with GI for chronic n/v. Some improvement with PPI and phenergan.   Today she feels poorly due to HD yesterday. She is unable to remember who her nephrologist is.     HIV 1 RNA Quant  Date Value  12/22/2019 <20 Copies/mL  07/20/2019 <20 copies/mL  03/07/2019 <20 NOT DETECTED copies/mL   CD4 T Cell Abs (/uL)  Date Value  12/22/2019 229 (L)  07/20/2019 169 (L)  10/19/2017 520     Health Maintenance  Topic Date Due   COVID-19 Vaccine (1) Never done   TETANUS/TDAP  Never done   Zoster Vaccines- Shingrix (1 of 2) Never done   DEXA SCAN  Never done   MAMMOGRAM  12/23/2018   INFLUENZA VACCINE  09/30/2020   COLONOSCOPY (Pts 45-34yrs Insurance coverage will need to be confirmed)  01/01/2030   Hepatitis C Screening  Completed   PNA vac Low Risk Adult  Completed   HPV VACCINES  Aged Out      Review of Systems  Constitutional:  Negative for chills, fever and weight loss.  Respiratory:  Negative for cough and shortness of breath.   Cardiovascular:  Positive for chest pain (relieved with "pain pill" she has been  rx.).  Gastrointestinal:  Positive for nausea and vomiting. Negative for abdominal pain, constipation and diarrhea.  Neurological:  Positive for headaches.   Please see HPI. All other systems reviewed and negative.     Objective:  Physical Exam Vitals reviewed.  Constitutional:      General: She is not in acute distress.    Appearance: Normal appearance. She is not toxic-appearing.  HENT:     Mouth/Throat:     Mouth: Mucous membranes are moist.     Pharynx: No oropharyngeal exudate.  Eyes:     Extraocular Movements: Extraocular movements intact.     Pupils: Pupils are equal, round, and reactive to light.  Cardiovascular:     Rate and Rhythm: Normal rate and regular rhythm.  Pulmonary:     Effort: Pulmonary effort is normal.     Breath sounds: Normal breath sounds.  Abdominal:     General: Bowel sounds are normal. There is no distension.     Palpations: Abdomen is soft.     Tenderness: There is no abdominal tenderness.  Musculoskeletal:     Cervical back: Normal range of motion and neck supple.     Right lower leg: No edema.     Left lower leg: No edema.  Skin:         Comments: 1) fistula RUE- scabbing at distal portion. No bleeding. +bruit.  2)  fistula LUE- no bruit.   Neurological:     Mental Status: She is alert.          Assessment & Plan:

## 2020-10-11 NOTE — Assessment & Plan Note (Addendum)
Has HD in AM States he RUE fistula has infection and she was getting anbx at HD (ended today).  It currently does not appear infected, only scabbed. No heat or erythema.

## 2020-10-25 ENCOUNTER — Ambulatory Visit (INDEPENDENT_AMBULATORY_CARE_PROVIDER_SITE_OTHER): Payer: Medicare Other | Admitting: Vascular Surgery

## 2020-10-25 ENCOUNTER — Encounter: Payer: Self-pay | Admitting: Vascular Surgery

## 2020-10-25 ENCOUNTER — Other Ambulatory Visit: Payer: Self-pay

## 2020-10-25 VITALS — BP 188/140 | HR 73 | Temp 97.6°F | Resp 16 | Ht 64.0 in | Wt 102.3 lb

## 2020-10-25 DIAGNOSIS — Z992 Dependence on renal dialysis: Secondary | ICD-10-CM

## 2020-10-25 DIAGNOSIS — N186 End stage renal disease: Secondary | ICD-10-CM | POA: Diagnosis not present

## 2020-10-25 NOTE — H&P (View-Only) (Signed)
Patient ID: [name], female   DOB: 12/02/1950, 70 y.o.   MRN: [id number]  Reason for Consult: Wound Check (Wound on right AVF)   Referred by Hilbert Corrigan*  Subjective:     HPI:  [name] is a 70 y.o. female history of end-stage renal disease currently on dialysis via right arm AV fistula on Tuesdays, Thursdays and Saturdays.  She is with her aide today from [employer].  She has been treated with dialysis with antibiotics for concern for infection for right arm pseudoaneurysm.  She denies any fevers or chills.  She does have swelling of the upper arm.  She has a known right innominate stent.  She also has failed left upper extremity access.  She does not take any blood thinners.  Past Medical History:  Diagnosis Date   Anemia    Anxiety    Atrial flutter ([place]) 02/22/2015   C. difficile colitis 10/10/11   Chronic diarrhea    Chronic pain    Depression    ESRD on hemodialysis [facility])    Dialysis [name]   GERD (gastroesophageal reflux disease)    HIV (human immunodeficiency virus infection) ([place])    Hypertension    Insomnia    Intracranial hemorrhage (HCC)    Muscle weakness (generalized)    Pulmonary HTN (HCC)    Tachycardia    TIA (transient ischemic attack)    [facility] do not have this dx   Traumatic hematoma of right upper arm 02/22/2015   Family History  Problem Relation Age of Onset   Anesthesia problems Neg Hx    Hypotension Neg Hx    Malignant hyperthermia Neg Hx    Pseudochol deficiency Neg Hx    Colon cancer Neg Hx        Unsure of parents who both died when she was a baby   Past Surgical History:  Procedure Laterality Date   A/V FISTULAGRAM N/A 04/17/2016   Procedure: A/V Fistulagram - Right Upper;  Surgeon: Waynetta Sandy, MD;  Location: [place] [facility];  Service: Cardiovascular;  Laterality: N/A;   A/V FISTULAGRAM Right 08/28/2019   Procedure: A/V FISTULAGRAM;  Surgeon: Waynetta Sandy, MD;  Location: [place] [facility];  Service: Cardiovascular;  Laterality: Right;   ABDOMINAL HYSTERECTOMY     BIOPSY  01/02/2020   Procedure: BIOPSY;  Surgeon: Eloise Harman, DO;  [facility];  Service: Endoscopy;;   BIOPSY  07/11/2020   Procedure: BIOPSY;  Surgeon: Irving Copas., MD;  Location: [place];  Service: Gastroenterology;;   BIOPSY THYROID     CARPAL TUNNEL RELEASE Right 11/16/2019   Procedure: CARPAL TUNNEL RELEASE;  Surgeon: Leanora Cover, MD;  Location: [place];  Service: Orthopedics;  Laterality: Right;   COLONOSCOPY WITH PROPOFOL N/A 01/02/2020   Procedure: COLONOSCOPY WITH PROPOFOL;  Surgeon: Eloise Harman, DO;  [facility];  Service: Endoscopy;  Laterality: N/A;  1:15pm-[facility] at [facility]   Dialysis Shunts     previous one removed from left arm and now present one in right arm   DIALYSIS/PERMA CATHETER INSERTION Right 04/17/2016   Procedure: dialysis Catheter Insertion central veinous;  Surgeon: Waynetta Sandy, MD;  Location: [place] [facility];  Service: Cardiovascular;  Laterality: Right;   ESOPHAGOGASTRODUODENOSCOPY  12/15/2010   [place]: erosive reflux esophagitis, MW tear, hiatal hernia (small), gastritis with no h.pylori, possibly nsaid related   ESOPHAGOGASTRODUODENOSCOPY (EGD) WITH PROPOFOL N/A  07/11/2020    Surgeon: Irving Copas., MD; normal esophagus, 4 cm hiatal hernia, gastritis biopsied (mild chronic gastritis, negative H. pylori), single duodenal polyp (Brunner's gland hyperplasia), s/p duodenal biopsy benign, duodenal narrowing at the duodenal sweep.   EUS N/A 07/11/2020    Surgeon: Irving Copas., MD;  dilated CBD and common hepatic duct without stones or sludge in the bile duct, stones and biliary sludge in the gallbladder, pancreatic parenchyma with hyperechoic strands, pancreatic duct dilated with prominent sidebranches in the neck and body and tail, no pancreatic mass though  visulalization limited, no ampullary lesion, no malignant appearing lymph node   EXCISION OF BREAST BIOPSY Right 05/04/2012   Procedure: EXCISION OF BREAST BIOPSY;  Surgeon: Donato Heinz, MD;  Location: [facility];  Service: General;  Laterality: Right;  Right Excisional Breast Biopsy   FISTULOGRAM Right 03/26/2014   Procedure: Right Arm Fistulogram with Venoplasty Right Subclavian Vein and Inominate Vein. Debridement Fistula Ulcer;  Surgeon: Conrad Edisto Beach, MD;  Location: [place];  Service: Vascular;  Laterality: Right;   FISTULOGRAM Right 03/29/2017   Procedure: FISTULOGRAM COMPLEX RIGHT ARM with Balloon angioplasty;  Surgeon: Waynetta Sandy, MD;  Location: [place];  Service: Vascular;  Laterality: Right;   HEMOSTASIS CLIP PLACEMENT  07/11/2020   Procedure: HEMOSTASIS CLIP PLACEMENT;  Surgeon: Irving Copas., MD;  Location: [place];  Service: Gastroenterology;;   IR GENERIC HISTORICAL  05/19/2016   IR REMOVAL TUN CV CATH W/O FL 05/19/2016 Markus Daft, MD [facility]   PERIPHERAL VASCULAR BALLOON ANGIOPLASTY Right 04/17/2016   Procedure: Peripheral Vascular Balloon Angioplasty;  Surgeon: Waynetta Sandy, MD;  Location: [place] [facility];  Service: Cardiovascular;  Laterality: Right;  Central segment AV Fistula   PERIPHERAL VASCULAR BALLOON ANGIOPLASTY Right 03/14/2018   Procedure: PERIPHERAL VASCULAR BALLOON ANGIOPLASTY;  Surgeon: Waynetta Sandy, MD;  Location: [place] [facility];  Service: Cardiovascular;  Laterality: Right;  subclavian vein   PERIPHERAL VASCULAR BALLOON ANGIOPLASTY Right 08/28/2019   Procedure: PERIPHERAL VASCULAR BALLOON ANGIOPLASTY;  Surgeon: Waynetta Sandy, MD;  Location: [place] [facility];  Service: Cardiovascular;  Laterality: Right;  upper arm   PERIPHERAL VASCULAR INTERVENTION Right 03/14/2018   Procedure: PERIPHERAL VASCULAR INTERVENTION;  Surgeon: Waynetta Sandy, MD;  Location: [place] [facility];  Service:  Cardiovascular;  Laterality: Right;  subclavian vein   POLYPECTOMY  07/11/2020   Procedure: POLYPECTOMY;  Surgeon: Mansouraty, Telford Nab., MD;  Location: [place];  Service: Gastroenterology;;   [place] Right 10/19/2013   Procedure: FISTULOGRAM;  Surgeon: Conrad Home, MD;  Location: [facility] C[facility];  Service: Cardiovascular;  Laterality: Right;   TONSILLECTOMY     VISCERAL VENOGRAPHY Right 03/14/2018   Procedure: CENTRAL VENOGRAPHY;  Surgeon: Waynetta Sandy, MD;  Location: [place] [facility];  Service: Cardiovascular;  Laterality: Right;  upper ext fistula    Short Social History:  Social History   Tobacco Use   Smoking status: Former    Types: Cigarettes    Quit date: 03/12/1983    Years since quitting: 37.6   Smokeless tobacco: Never  Substance Use Topics   Alcohol use: No    Alcohol/week: 0.0 standard drinks    Allergies  Allergen Reactions   Lactose Intolerance (Gi) Other (See Comments)    On MAR   Latex Rash and Itching   Penicillins Rash and Swelling    Has patient had a PCN reaction causing immediate rash, facial/tongue/throat swelling, SOB or lightheadedness with hypotension: No  Has patient had a PCN reaction causing severe rash involving mucus membranes or skin necrosis: No Has patient had a PCN reaction that required hospitalization No Has patient had a PCN reaction occurring within the last 10 years: No If all of the above answers are "NO", then may proceed with Cephalosporin use.      Current Outpatient Medications  Medication Sig Dispense Refill   Baclofen 5 MG TABS Take 5 mg by mouth daily as needed (muscle spasms).     Biotin 5000 MCG TABS Take 500 mcg by mouth daily with breakfast.     Calcium Acetate 667 MG TABS Take 1,334 mg by mouth in the morning, at noon, and at bedtime.     clopidogrel (PLAVIX) 75 MG tablet Take 1 tablet (75 mg total) by mouth daily with breakfast. 30 tablet    dolutegravir (TIVICAY) 50 MG tablet Take 50 mg by mouth daily  in the afternoon.     Emollient (MINERIN) LOTN Apply 1 application topically every 12 (twelve) hours as needed (dry skin). Apply to feet and ankles     fentaNYL (DURAGESIC) 25 MCG/HR Place 1 patch onto the skin every 3 (three) days.     lamivudine (EPIVIR) 100 MG tablet Take 50 mg by mouth daily. (0800)     Lidocaine HCl 4 % CREA Apply 1 application topically every 12 (twelve) hours as needed (wrist pain).     lidocaine-prilocaine (EMLA) cream Apply 1 application topically See admin instructions. Apply to fistula site one hour before dialysis on Tuesday, Thursday, and Saturday.     loratadine (CLARITIN) 10 MG tablet Take 10 mg by mouth daily.     melatonin 3 MG TABS tablet Take 3 mg by mouth at bedtime.     methadone (DOLOPHINE) 5 MG tablet Take 2.5 mg by mouth daily at 8 pm.      Multiple Vitamin (MULTIVITAMIN WITH MINERALS) TABS tablet Take 1 tablet by mouth at bedtime. (2000)     nitroGLYCERIN (NITROSTAT) 0.4 MG SL tablet Place 0.4 mg under the tongue every 5 (five) minutes x 3 doses as needed for chest pain.      Oxycodone HCl 10 MG TABS Take 1 tablet (10 mg total) by mouth every 12 (twelve) hours as needed (severe pain). 5 tablet 0   pantoprazole (PROTONIX) 40 MG tablet Take 1 tablet (40 mg total) by mouth 2 (two) times daily before a meal. 60 tablet 3   promethazine (PHENERGAN) 12.5 MG tablet Take 12.5 mg by mouth See admin instructions. Take 12.5 mg by mouth in the morning every Tuesday, Thursday and Saturday for nausea/vomiting and every 6 hours as needed for n/v     venlafaxine (EFFEXOR) 25 MG tablet Take 25 mg by mouth daily.     vitamin C (ASCORBIC ACID) 500 MG tablet Take 500 mg by mouth 2 (two) times daily. (0900 & 2100)     zidovudine (RETROVIR) 100 MG capsule Take 100 mg by mouth 3 (three) times daily. (0900, 1300, & 2100)     cyanocobalamin (,VITAMIN B-12,) 1000 MCG/ML injection Inject 1,000 mcg into the muscle every 30 (thirty) days. On the 28th of every month (Patient not taking:  Reported on 10/25/2020)     No current facility-administered medications for this visit.   Facility-Administered Medications Ordered in Other Visits  Medication Dose Route Frequency Provider Last Rate Last Admin   0.9 %  sodium chloride infusion (Manually program via Guardrails IV Fluids)  250 mL Intravenous Once Lockamy, Randi L, NP-C  acetaminophen (TYLENOL) tablet 650 mg  650 mg Oral Once Lockamy, Randi L, NP-C       diphenhydrAMINE (BENADRYL) capsule 25 mg  25 mg Oral Once Lockamy, Randi L, NP-C       sodium chloride flush (NS) 0.9 % injection 10 mL  10 mL Intracatheter PRN Lockamy, Randi L, NP-C        Review of Systems  Constitutional: Negative for fever.  HENT: HENT negative.  Eyes: Eyes negative.  Cardiovascular: Cardiovascular negative.  GI: Gastrointestinal negative.  Musculoskeletal: Musculoskeletal negative.       Right arm swelling Skin: Skin negative.  Neurological: Neurological negative. Hematologic: Hematologic/lymphatic negative.  Psychiatric: Psychiatric negative.       Objective:  Objective   Vitals:   10/25/20 1334  BP: (!) 188/140  Pulse: 73  Resp: 16  Temp: 97.6 F (36.4 C)  TempSrc: Temporal  SpO2: 92%  Weight: 102 lb 4.7 oz (46.4 kg)  Height: 5\' 4"  (1.626 m)   Body mass index is 17.56 kg/m.  Physical Exam HENT:     Head: Normocephalic.     Nose:     Comments: Wearing a mask Eyes:     Pupils: Pupils are equal, round, and reactive to light.  Cardiovascular:     Pulses:          Radial pulses are 2+ on the right side and 2+ on the left side.  Pulmonary:     Effort: Pulmonary effort is normal.  Abdominal:     General: Abdomen is flat.     Palpations: Abdomen is soft.  Musculoskeletal:     Comments: Right upper arm swelling with evidence of collateral veins on her chest on the right Right forearm pseudoaneurysm with scabbing no active evidence of bleeding no erythema  Neurological:     General: No focal deficit present.     Mental  Status: She is alert.  Psychiatric:        Mood and Affect: Mood normal.        Behavior: Behavior normal.        Thought Content: Thought content normal.         Assessment/Plan:     70 year old female here for evaluation of right upper extremity pseudoaneurysm with concern for infection.  There is a scab there no active bleeding no active erythema.  She does have swelling of the upper arm concerning for stent stenosis versus occlusion.  We need to start with fistulogram to define the central anatomy hopefully resolve some of the swelling.  From there we can consider either resecting the pseudoaneurysm in the forearm versus converting it to an upper arm fistula and ligating the forearm fistula.  I discussed these plans with the patient and her caregiver today and they demonstrate good understanding we will get her scheduled for fistulogram of the right upper extremity on a nondialysis day in the near future.     Waynetta Sandy MD [facility]

## 2020-10-25 NOTE — Progress Notes (Signed)
Patient ID: [name], female   DOB: 1950-03-06, 70 y.o.   MRN: [id number]  Reason for Consult: Wound Check (Wound on right AVF)   Referred by Hilbert Corrigan*  Subjective:     HPI:  [name] is a 70 y.o. female history of end-stage renal disease currently on dialysis via right arm AV fistula on Tuesdays, Thursdays and Saturdays.  She is with her aide today from [facility].  She has been treated with dialysis with antibiotics for concern for infection for right arm pseudoaneurysm.  She denies any fevers or chills.  She does have swelling of the upper arm.  She has a known right innominate stent.  She also has failed left upper extremity access.  She does not take any blood thinners.  Past Medical History:  Diagnosis Date   Anemia    Anxiety    Atrial flutter ([place]) 02/22/2015   C. difficile colitis 10/10/11   Chronic diarrhea    Chronic pain    Depression    ESRD on hemodialysis [facility])    Dialysis [name]   GERD (gastroesophageal reflux disease)    HIV (human immunodeficiency virus infection) ([place])    Hypertension    Insomnia    Intracranial hemorrhage (HCC)    Muscle weakness (generalized)    Pulmonary HTN (HCC)    Tachycardia    TIA (transient ischemic attack)    [facility] do not have this dx   Traumatic hematoma of right upper arm 02/22/2015   Family History  Problem Relation Age of Onset   Anesthesia problems Neg Hx    Hypotension Neg Hx    Malignant hyperthermia Neg Hx    Pseudochol deficiency Neg Hx    Colon cancer Neg Hx        Unsure of parents who both died when she was a baby   Past Surgical History:  Procedure Laterality Date   A/V FISTULAGRAM N/A 04/17/2016   Procedure: A/V Fistulagram - Right Upper;  Surgeon: Waynetta Sandy, MD;  Location: [place] [facility];  Service: Cardiovascular;  Laterality: N/A;   A/V FISTULAGRAM Right 08/28/2019   Procedure: A/V FISTULAGRAM;  Surgeon: Waynetta Sandy, MD;  Location: [place] [facility];  Service: Cardiovascular;  Laterality: Right;   ABDOMINAL HYSTERECTOMY     BIOPSY  01/02/2020   Procedure: BIOPSY;  Surgeon: Eloise Harman, DO;  [facility];  Service: Endoscopy;;   BIOPSY  07/11/2020   Procedure: BIOPSY;  Surgeon: Irving Copas., MD;  Location: [place];  Service: Gastroenterology;;   BIOPSY THYROID     CARPAL TUNNEL RELEASE Right 11/16/2019   Procedure: CARPAL TUNNEL RELEASE;  Surgeon: Leanora Cover, MD;  Location: [place];  Service: Orthopedics;  Laterality: Right;   COLONOSCOPY WITH PROPOFOL N/A 01/02/2020   Procedure: COLONOSCOPY WITH PROPOFOL;  Surgeon: Eloise Harman, DO;  [facility];  Service: Endoscopy;  Laterality: N/A;  1:15pm-pt at [facility]   Dialysis Shunts     previous one removed from left arm and now present one in right arm   DIALYSIS/PERMA CATHETER INSERTION Right 04/17/2016   Procedure: dialysis Catheter Insertion central veinous;  Surgeon: Waynetta Sandy, MD;  Location: [place] [facility];  Service: Cardiovascular;  Laterality: Right;   ESOPHAGOGASTRODUODENOSCOPY  12/15/2010   Rourk: erosive reflux esophagitis, MW tear, hiatal hernia (small), gastritis with no h.pylori, possibly nsaid related   ESOPHAGOGASTRODUODENOSCOPY (EGD) WITH PROPOFOL N/A  07/11/2020    Surgeon: Irving Copas., MD; normal esophagus, 4 cm hiatal hernia, gastritis biopsied (mild chronic gastritis, negative H. pylori), single duodenal polyp (Brunner's gland hyperplasia), s/p duodenal biopsy benign, duodenal narrowing at the duodenal sweep.   EUS N/A 07/11/2020    Surgeon: Irving Copas., MD;  dilated CBD and common hepatic duct without stones or sludge in the bile duct, stones and biliary sludge in the gallbladder, pancreatic parenchyma with hyperechoic strands, pancreatic duct dilated with prominent sidebranches in the neck and body and tail, no pancreatic mass though  visulalization limited, no ampullary lesion, no malignant appearing lymph node   EXCISION OF BREAST BIOPSY Right 05/04/2012   Procedure: EXCISION OF BREAST BIOPSY;  Surgeon: Donato Heinz, MD;  Location: [facility];  Service: General;  Laterality: Right;  Right Excisional Breast Biopsy   FISTULOGRAM Right 03/26/2014   Procedure: Right Arm Fistulogram with Venoplasty Right Subclavian Vein and Inominate Vein. Debridement Fistula Ulcer;  Surgeon: Conrad Barstow, MD;  Location: [place];  Service: Vascular;  Laterality: Right;   FISTULOGRAM Right 03/29/2017   Procedure: FISTULOGRAM COMPLEX RIGHT ARM with Balloon angioplasty;  Surgeon: Waynetta Sandy, MD;  Location: [place];  Service: Vascular;  Laterality: Right;   HEMOSTASIS CLIP PLACEMENT  07/11/2020   Procedure: HEMOSTASIS CLIP PLACEMENT;  Surgeon: Irving Copas., MD;  Location: [place];  Service: Gastroenterology;;   IR GENERIC HISTORICAL  05/19/2016   IR REMOVAL TUN CV CATH W/O FL 05/19/2016 Markus Daft, MD [facility]-INTERV RAD   PERIPHERAL VASCULAR BALLOON ANGIOPLASTY Right 04/17/2016   Procedure: Peripheral Vascular Balloon Angioplasty;  Surgeon: Waynetta Sandy, MD;  Location: [place] [facility];  Service: Cardiovascular;  Laterality: Right;  Central segment AV Fistula   PERIPHERAL VASCULAR BALLOON ANGIOPLASTY Right 03/14/2018   Procedure: PERIPHERAL VASCULAR BALLOON ANGIOPLASTY;  Surgeon: Waynetta Sandy, MD;  Location: [place] [facility];  Service: Cardiovascular;  Laterality: Right;  subclavian vein   PERIPHERAL VASCULAR BALLOON ANGIOPLASTY Right 08/28/2019   Procedure: PERIPHERAL VASCULAR BALLOON ANGIOPLASTY;  Surgeon: Waynetta Sandy, MD;  Location: [place] [facility];  Service: Cardiovascular;  Laterality: Right;  upper arm   PERIPHERAL VASCULAR INTERVENTION Right 03/14/2018   Procedure: PERIPHERAL VASCULAR INTERVENTION;  Surgeon: Waynetta Sandy, MD;  Location: [place] [facility];  Service:  Cardiovascular;  Laterality: Right;  subclavian vein   POLYPECTOMY  07/11/2020   Procedure: POLYPECTOMY;  Surgeon: Mansouraty, Telford Nab., MD;  Location: [place];  Service: Gastroenterology;;   Earney Mallet Right 10/19/2013   Procedure: FISTULOGRAM;  Surgeon: Conrad Ruhenstroth, MD;  Location: [facility];  Service: Cardiovascular;  Laterality: Right;   TONSILLECTOMY     VISCERAL VENOGRAPHY Right 03/14/2018   Procedure: CENTRAL VENOGRAPHY;  Surgeon: Waynetta Sandy, MD;  Location: [place] [facility];  Service: Cardiovascular;  Laterality: Right;  upper ext fistula    Short Social History:  Social History   Tobacco Use   Smoking status: Former    Types: Cigarettes    Quit date: 03/12/1983    Years since quitting: 37.6   Smokeless tobacco: Never  Substance Use Topics   Alcohol use: No    Alcohol/week: 0.0 standard drinks    Allergies  Allergen Reactions   Lactose Intolerance (Gi) Other (See Comments)    On MAR   Latex Rash and Itching   Penicillins Rash and Swelling    Has patient had a PCN reaction causing immediate rash, facial/tongue/throat swelling, SOB or lightheadedness with hypotension: No  Has patient had a PCN reaction causing severe rash involving mucus membranes or skin necrosis: No Has patient had a PCN reaction that required hospitalization No Has patient had a PCN reaction occurring within the last 10 years: No If all of the above answers are "NO", then may proceed with Cephalosporin use.      Current Outpatient Medications  Medication Sig Dispense Refill   Baclofen 5 MG TABS Take 5 mg by mouth daily as needed (muscle spasms).     Biotin 5000 MCG TABS Take 500 mcg by mouth daily with breakfast.     Calcium Acetate 667 MG TABS Take 1,334 mg by mouth in the morning, at noon, and at bedtime.     clopidogrel (PLAVIX) 75 MG tablet Take 1 tablet (75 mg total) by mouth daily with breakfast. 30 tablet    dolutegravir (TIVICAY) 50 MG tablet Take 50 mg by mouth daily  in the afternoon.     Emollient (MINERIN) LOTN Apply 1 application topically every 12 (twelve) hours as needed (dry skin). Apply to feet and ankles     fentaNYL (DURAGESIC) 25 MCG/HR Place 1 patch onto the skin every 3 (three) days.     lamivudine (EPIVIR) 100 MG tablet Take 50 mg by mouth daily. (0800)     Lidocaine HCl 4 % CREA Apply 1 application topically every 12 (twelve) hours as needed (wrist pain).     lidocaine-prilocaine (EMLA) cream Apply 1 application topically See admin instructions. Apply to fistula site one hour before dialysis on Tuesday, Thursday, and Saturday.     loratadine (CLARITIN) 10 MG tablet Take 10 mg by mouth daily.     melatonin 3 MG TABS tablet Take 3 mg by mouth at bedtime.     methadone (DOLOPHINE) 5 MG tablet Take 2.5 mg by mouth daily at 8 pm.      Multiple Vitamin (MULTIVITAMIN WITH MINERALS) TABS tablet Take 1 tablet by mouth at bedtime. (2000)     nitroGLYCERIN (NITROSTAT) 0.4 MG SL tablet Place 0.4 mg under the tongue every 5 (five) minutes x 3 doses as needed for chest pain.      Oxycodone HCl 10 MG TABS Take 1 tablet (10 mg total) by mouth every 12 (twelve) hours as needed (severe pain). 5 tablet 0   pantoprazole (PROTONIX) 40 MG tablet Take 1 tablet (40 mg total) by mouth 2 (two) times daily before a meal. 60 tablet 3   promethazine (PHENERGAN) 12.5 MG tablet Take 12.5 mg by mouth See admin instructions. Take 12.5 mg by mouth in the morning every Tuesday, Thursday and Saturday for nausea/vomiting and every 6 hours as needed for n/v     venlafaxine (EFFEXOR) 25 MG tablet Take 25 mg by mouth daily.     vitamin C (ASCORBIC ACID) 500 MG tablet Take 500 mg by mouth 2 (two) times daily. (0900 & 2100)     zidovudine (RETROVIR) 100 MG capsule Take 100 mg by mouth 3 (three) times daily. (0900, 1300, & 2100)     cyanocobalamin (,VITAMIN B-12,) 1000 MCG/ML injection Inject 1,000 mcg into the muscle every 30 (thirty) days. On the 28th of every month (Patient not taking:  Reported on 10/25/2020)     No current facility-administered medications for this visit.   Facility-Administered Medications Ordered in Other Visits  Medication Dose Route Frequency Provider Last Rate Last Admin   0.9 %  sodium chloride infusion (Manually program via Guardrails IV Fluids)  250 mL Intravenous Once Lockamy, Randi L, NP-C  acetaminophen (TYLENOL) tablet 650 mg  650 mg Oral Once Lockamy, Randi L, NP-C       diphenhydrAMINE (BENADRYL) capsule 25 mg  25 mg Oral Once Lockamy, Randi L, NP-C       sodium chloride flush (NS) 0.9 % injection 10 mL  10 mL Intracatheter PRN Lockamy, Randi L, NP-C        Review of Systems  Constitutional: Negative for fever.  HENT: HENT negative.  Eyes: Eyes negative.  Cardiovascular: Cardiovascular negative.  GI: Gastrointestinal negative.  Musculoskeletal: Musculoskeletal negative.       Right arm swelling Skin: Skin negative.  Neurological: Neurological negative. Hematologic: Hematologic/lymphatic negative.  Psychiatric: Psychiatric negative.       Objective:  Objective   Vitals:   10/25/20 1334  BP: (!) 188/140  Pulse: 73  Resp: 16  Temp: 97.6 F (36.4 C)  TempSrc: Temporal  SpO2: 92%  Weight: 102 lb 4.7 oz (46.4 kg)  Height: 5\' 4"  (1.626 m)   Body mass index is 17.56 kg/m.  Physical Exam HENT:     Head: Normocephalic.     Nose:     Comments: Wearing a mask Eyes:     Pupils: Pupils are equal, round, and reactive to light.  Cardiovascular:     Pulses:          Radial pulses are 2+ on the right side and 2+ on the left side.  Pulmonary:     Effort: Pulmonary effort is normal.  Abdominal:     General: Abdomen is flat.     Palpations: Abdomen is soft.  Musculoskeletal:     Comments: Right upper arm swelling with evidence of collateral veins on her chest on the right Right forearm pseudoaneurysm with scabbing no active evidence of bleeding no erythema  Neurological:     General: No focal deficit present.     Mental  Status: She is alert.  Psychiatric:        Mood and Affect: Mood normal.        Behavior: Behavior normal.        Thought Content: Thought content normal.         Assessment/Plan:     70 year old female here for evaluation of right upper extremity pseudoaneurysm with concern for infection.  There is a scab there no active bleeding no active erythema.  She does have swelling of the upper arm concerning for stent stenosis versus occlusion.  We need to start with fistulogram to define the central anatomy hopefully resolve some of the swelling.  From there we can consider either resecting the pseudoaneurysm in the forearm versus converting it to an upper arm fistula and ligating the forearm fistula.  I discussed these plans with the patient and her caregiver today and they demonstrate good understanding we will get her scheduled for fistulogram of the right upper extremity on a nondialysis day in the near future.     Waynetta Sandy MD [facility]

## 2020-11-01 ENCOUNTER — Other Ambulatory Visit: Payer: Self-pay

## 2020-11-11 ENCOUNTER — Other Ambulatory Visit: Payer: Self-pay

## 2020-11-11 ENCOUNTER — Encounter (HOSPITAL_COMMUNITY)
Admission: RE | Disposition: A | Discharge status: Skilled Nursing Facility | Payer: Self-pay | Source: Skilled Nursing Facility | Attending: Vascular Surgery

## 2020-11-11 ENCOUNTER — Inpatient Hospital Stay (HOSPITAL_COMMUNITY)
Admission: RE | Admit: 2020-11-11 | Discharge: 2020-11-27 | DRG: 252 | Disposition: A | Discharge status: Skilled Nursing Facility | Payer: Medicare Other | Source: Skilled Nursing Facility | Attending: Vascular Surgery | Admitting: Vascular Surgery

## 2020-11-11 DIAGNOSIS — Z7902 Long term (current) use of antithrombotics/antiplatelets: Secondary | ICD-10-CM

## 2020-11-11 DIAGNOSIS — I272 Pulmonary hypertension, unspecified: Secondary | ICD-10-CM | POA: Diagnosis present

## 2020-11-11 DIAGNOSIS — T82510A Breakdown (mechanical) of surgically created arteriovenous fistula, initial encounter: Principal | ICD-10-CM | POA: Diagnosis present

## 2020-11-11 DIAGNOSIS — I120 Hypertensive chronic kidney disease with stage 5 chronic kidney disease or end stage renal disease: Secondary | ICD-10-CM | POA: Diagnosis present

## 2020-11-11 DIAGNOSIS — N2581 Secondary hyperparathyroidism of renal origin: Secondary | ICD-10-CM | POA: Diagnosis present

## 2020-11-11 DIAGNOSIS — G928 Other toxic encephalopathy: Secondary | ICD-10-CM | POA: Diagnosis present

## 2020-11-11 DIAGNOSIS — R011 Cardiac murmur, unspecified: Secondary | ICD-10-CM | POA: Diagnosis present

## 2020-11-11 DIAGNOSIS — E1152 Type 2 diabetes mellitus with diabetic peripheral angiopathy with gangrene: Secondary | ICD-10-CM | POA: Diagnosis present

## 2020-11-11 DIAGNOSIS — T82898A Other specified complication of vascular prosthetic devices, implants and grafts, initial encounter: Secondary | ICD-10-CM | POA: Diagnosis not present

## 2020-11-11 DIAGNOSIS — E43 Unspecified severe protein-calorie malnutrition: Secondary | ICD-10-CM | POA: Insufficient documentation

## 2020-11-11 DIAGNOSIS — F419 Anxiety disorder, unspecified: Secondary | ICD-10-CM | POA: Diagnosis present

## 2020-11-11 DIAGNOSIS — Z20822 Contact with and (suspected) exposure to covid-19: Secondary | ICD-10-CM | POA: Diagnosis present

## 2020-11-11 DIAGNOSIS — I96 Gangrene, not elsewhere classified: Secondary | ICD-10-CM | POA: Diagnosis present

## 2020-11-11 DIAGNOSIS — K449 Diaphragmatic hernia without obstruction or gangrene: Secondary | ICD-10-CM | POA: Diagnosis present

## 2020-11-11 DIAGNOSIS — Z79899 Other long term (current) drug therapy: Secondary | ICD-10-CM

## 2020-11-11 DIAGNOSIS — I4891 Unspecified atrial fibrillation: Secondary | ICD-10-CM | POA: Diagnosis present

## 2020-11-11 DIAGNOSIS — I721 Aneurysm of artery of upper extremity: Secondary | ICD-10-CM | POA: Diagnosis present

## 2020-11-11 DIAGNOSIS — Z681 Body mass index (BMI) 19 or less, adult: Secondary | ICD-10-CM

## 2020-11-11 DIAGNOSIS — Z79891 Long term (current) use of opiate analgesic: Secondary | ICD-10-CM

## 2020-11-11 DIAGNOSIS — Z8673 Personal history of transient ischemic attack (TIA), and cerebral infarction without residual deficits: Secondary | ICD-10-CM

## 2020-11-11 DIAGNOSIS — G4700 Insomnia, unspecified: Secondary | ICD-10-CM | POA: Diagnosis present

## 2020-11-11 DIAGNOSIS — K2100 Gastro-esophageal reflux disease with esophagitis, without bleeding: Secondary | ICD-10-CM | POA: Diagnosis present

## 2020-11-11 DIAGNOSIS — Z91040 Latex allergy status: Secondary | ICD-10-CM

## 2020-11-11 DIAGNOSIS — Z87891 Personal history of nicotine dependence: Secondary | ICD-10-CM

## 2020-11-11 DIAGNOSIS — F32A Depression, unspecified: Secondary | ICD-10-CM | POA: Diagnosis present

## 2020-11-11 DIAGNOSIS — N186 End stage renal disease: Secondary | ICD-10-CM | POA: Diagnosis not present

## 2020-11-11 DIAGNOSIS — Z7901 Long term (current) use of anticoagulants: Secondary | ICD-10-CM

## 2020-11-11 DIAGNOSIS — L89159 Pressure ulcer of sacral region, unspecified stage: Secondary | ICD-10-CM | POA: Diagnosis present

## 2020-11-11 DIAGNOSIS — B20 Human immunodeficiency virus [HIV] disease: Secondary | ICD-10-CM | POA: Diagnosis present

## 2020-11-11 DIAGNOSIS — D631 Anemia in chronic kidney disease: Secondary | ICD-10-CM | POA: Diagnosis present

## 2020-11-11 DIAGNOSIS — R079 Chest pain, unspecified: Secondary | ICD-10-CM

## 2020-11-11 DIAGNOSIS — I4892 Unspecified atrial flutter: Secondary | ICD-10-CM | POA: Diagnosis present

## 2020-11-11 DIAGNOSIS — E1122 Type 2 diabetes mellitus with diabetic chronic kidney disease: Secondary | ICD-10-CM | POA: Diagnosis present

## 2020-11-11 DIAGNOSIS — Z992 Dependence on renal dialysis: Secondary | ICD-10-CM

## 2020-11-11 DIAGNOSIS — R4182 Altered mental status, unspecified: Secondary | ICD-10-CM

## 2020-11-11 DIAGNOSIS — Y713 Surgical instruments, materials and cardiovascular devices (including sutures) associated with adverse incidents: Secondary | ICD-10-CM | POA: Diagnosis present

## 2020-11-11 DIAGNOSIS — E739 Lactose intolerance, unspecified: Secondary | ICD-10-CM | POA: Diagnosis present

## 2020-11-11 DIAGNOSIS — G8929 Other chronic pain: Secondary | ICD-10-CM | POA: Diagnosis present

## 2020-11-11 DIAGNOSIS — Z880 Allergy status to penicillin: Secondary | ICD-10-CM

## 2020-11-11 DIAGNOSIS — D696 Thrombocytopenia, unspecified: Secondary | ICD-10-CM | POA: Diagnosis present

## 2020-11-11 HISTORY — PX: PERIPHERAL VASCULAR BALLOON ANGIOPLASTY: CATH118281

## 2020-11-11 LAB — CBC
HCT: 31.2 % — ABNORMAL LOW (ref 36.0–46.0)
Hemoglobin: 9.3 g/dL — ABNORMAL LOW (ref 12.0–15.0)
MCH: 31.6 pg (ref 26.0–34.0)
MCHC: 29.8 g/dL — ABNORMAL LOW (ref 30.0–36.0)
MCV: 106.1 fL — ABNORMAL HIGH (ref 80.0–100.0)
Platelets: 144 10*3/uL — ABNORMAL LOW (ref 150–400)
RBC: 2.94 MIL/uL — ABNORMAL LOW (ref 3.87–5.11)
RDW: 14.7 % (ref 11.5–15.5)
WBC: 2.8 10*3/uL — ABNORMAL LOW (ref 4.0–10.5)
nRBC: 0 % (ref 0.0–0.2)

## 2020-11-11 LAB — POCT I-STAT, CHEM 8
BUN: 49 mg/dL — ABNORMAL HIGH (ref 8–23)
Calcium, Ion: 1.27 mmol/L (ref 1.15–1.40)
Chloride: 100 mmol/L (ref 98–111)
Creatinine, Ser: 11.4 mg/dL — ABNORMAL HIGH (ref 0.44–1.00)
Glucose, Bld: 78 mg/dL (ref 70–99)
HCT: 34 % — ABNORMAL LOW (ref 36.0–46.0)
Hemoglobin: 11.6 g/dL — ABNORMAL LOW (ref 12.0–15.0)
Potassium: 4.2 mmol/L (ref 3.5–5.1)
Sodium: 141 mmol/L (ref 135–145)
TCO2: 31 mmol/L (ref 22–32)

## 2020-11-11 LAB — CREATININE, SERUM
Creatinine, Ser: 11.29 mg/dL — ABNORMAL HIGH (ref 0.44–1.00)
GFR, Estimated: 3 mL/min — ABNORMAL LOW (ref >60)

## 2020-11-11 LAB — GLUCOSE, CAPILLARY: Glucose-Capillary: 71 mg/dL (ref 70–99)

## 2020-11-11 SURGERY — PERIPHERAL VASCULAR BALLOON ANGIOPLASTY
Anesthesia: LOCAL | Laterality: Right

## 2020-11-11 MED ORDER — SODIUM CHLORIDE 0.9% FLUSH: 3.0000 mL | INTRAVENOUS | Status: DC | PRN

## 2020-11-11 MED ORDER — BIOTIN 5000 MCG PO TABS: 5000.0000 ug | ORAL_TABLET | Freq: Every day | ORAL | Status: DC

## 2020-11-11 MED ORDER — HEPARIN (PORCINE) IN NACL 1000-0.9 UT/500ML-% IV SOLN
INTRAVENOUS | Status: DC | PRN
  Administered 2020-11-11: 500 mL

## 2020-11-11 MED ORDER — ASCORBIC ACID 500 MG PO TABS
500.0000 mg | ORAL_TABLET | Freq: Two times a day (BID) | ORAL | Status: DC
  Administered 2020-11-11 – 2020-11-27 (×28): 500 mg via ORAL
  Filled 2020-11-11 (×27): qty 1

## 2020-11-11 MED ORDER — MELATONIN 3 MG PO TABS
3.0000 mg | ORAL_TABLET | ORAL | Status: DC
  Administered 2020-11-11 – 2020-11-26 (×15): 3 mg via ORAL
  Filled 2020-11-11 (×15): qty 1

## 2020-11-11 MED ORDER — SODIUM CHLORIDE 0.9% FLUSH: 3.0000 mL | Freq: Two times a day (BID) | INTRAVENOUS | Status: DC

## 2020-11-11 MED ORDER — MINERIN EX LOTN: 1.0000 "application " | TOPICAL_LOTION | Freq: Two times a day (BID) | CUTANEOUS | Status: DC | PRN

## 2020-11-11 MED ORDER — ONDANSETRON HCL 4 MG/2ML IJ SOLN
4.0000 mg | Freq: Four times a day (QID) | INTRAMUSCULAR | Status: DC | PRN
  Administered 2020-11-21 – 2020-11-26 (×5): 4 mg via INTRAVENOUS
  Filled 2020-11-11 (×5): qty 2

## 2020-11-11 MED ORDER — IODIXANOL 320 MG/ML IV SOLN
INTRAVENOUS | Status: DC | PRN
  Administered 2020-11-11: 60 mL via INTRAVENOUS

## 2020-11-11 MED ORDER — OXYCODONE HCL 5 MG PO TABS
10.0000 mg | ORAL_TABLET | Freq: Two times a day (BID) | ORAL | Status: DC | PRN
  Administered 2020-11-13 – 2020-11-18 (×10): 10 mg via ORAL
  Filled 2020-11-11 (×10): qty 2

## 2020-11-11 MED ORDER — ADULT MULTIVITAMIN W/MINERALS CH
1.0000 | ORAL_TABLET | ORAL | Status: DC
  Administered 2020-11-11 – 2020-11-19 (×8): 1 via ORAL
  Filled 2020-11-11 (×8): qty 1

## 2020-11-11 MED ORDER — PROMETHAZINE HCL 12.5 MG PO TABS: 12.5000 mg | ORAL_TABLET | ORAL | Status: DC

## 2020-11-11 MED ORDER — METHADONE HCL 5 MG PO TABS
2.5000 mg | ORAL_TABLET | Freq: Every day | ORAL | Status: DC
  Administered 2020-11-11 – 2020-11-17 (×7): 2.5 mg via ORAL
  Filled 2020-11-11 (×7): qty 1

## 2020-11-11 MED ORDER — MIRTAZAPINE 15 MG PO TABS
7.5000 mg | ORAL_TABLET | Freq: Every day | ORAL | Status: DC
  Administered 2020-11-11 – 2020-11-26 (×15): 7.5 mg via ORAL
  Filled 2020-11-11 (×15): qty 1

## 2020-11-11 MED ORDER — ACETAMINOPHEN 325 MG PO TABS
650.0000 mg | ORAL_TABLET | ORAL | Status: DC | PRN
  Administered 2020-11-19 – 2020-11-26 (×4): 650 mg via ORAL
  Filled 2020-11-11 (×4): qty 2

## 2020-11-11 MED ORDER — SENNA 8.6 MG PO TABS
2.0000 | ORAL_TABLET | ORAL | Status: DC
  Administered 2020-11-11 – 2020-11-25 (×13): 17.2 mg via ORAL
  Filled 2020-11-11 (×15): qty 2

## 2020-11-11 MED ORDER — SODIUM CHLORIDE 0.9% FLUSH
3.0000 mL | Freq: Two times a day (BID) | INTRAVENOUS | Status: DC
  Administered 2020-11-11 – 2020-11-26 (×29): 3 mL via INTRAVENOUS

## 2020-11-11 MED ORDER — SODIUM CHLORIDE 0.9 % IV SOLN: 250.0000 mL | INTRAVENOUS | Status: DC | PRN

## 2020-11-11 MED ORDER — HEPARIN SODIUM (PORCINE) 5000 UNIT/ML IJ SOLN
5000.0000 [IU] | Freq: Three times a day (TID) | INTRAMUSCULAR | Status: DC
  Administered 2020-11-11 – 2020-11-20 (×25): 5000 [IU] via SUBCUTANEOUS
  Filled 2020-11-11 (×23): qty 1

## 2020-11-11 MED ORDER — CALCIUM ACETATE (PHOS BINDER) 667 MG PO CAPS
1334.0000 mg | ORAL_CAPSULE | Freq: Three times a day (TID) | ORAL | Status: DC
  Administered 2020-11-13 – 2020-11-27 (×24): 1334 mg via ORAL
  Filled 2020-11-11 (×22): qty 2

## 2020-11-11 MED ORDER — BACLOFEN 10 MG PO TABS
5.0000 mg | ORAL_TABLET | Freq: Every day | ORAL | Status: DC | PRN
  Administered 2020-11-15 – 2020-11-17 (×4): 5 mg via ORAL
  Filled 2020-11-11 (×6): qty 0.5

## 2020-11-11 MED ORDER — LIDOCAINE HCL 4 % EX CREA: 1.0000 "application " | TOPICAL_CREAM | Freq: Two times a day (BID) | CUTANEOUS | Status: DC | PRN

## 2020-11-11 MED ORDER — ONDANSETRON HCL 4 MG PO TABS: 4.0000 mg | ORAL_TABLET | Freq: Three times a day (TID) | ORAL | Status: DC | PRN

## 2020-11-11 MED ORDER — FENTANYL CITRATE (PF) 100 MCG/2ML IJ SOLN
INTRAMUSCULAR | Status: DC | PRN
  Administered 2020-11-11: 25 ug via INTRAVENOUS

## 2020-11-11 MED ORDER — DOLUTEGRAVIR SODIUM 50 MG PO TABS
50.0000 mg | ORAL_TABLET | Freq: Every day | ORAL | Status: DC
  Administered 2020-11-13 – 2020-11-26 (×12): 50 mg via ORAL
  Filled 2020-11-11 (×17): qty 1

## 2020-11-11 MED ORDER — LABETALOL HCL 5 MG/ML IV SOLN: 10.0000 mg | INTRAVENOUS | Status: DC | PRN

## 2020-11-11 MED ORDER — HEPARIN (PORCINE) IN NACL 1000-0.9 UT/500ML-% IV SOLN
INTRAVENOUS | Status: AC
  Filled 2020-11-11: qty 500

## 2020-11-11 MED ORDER — LIDOCAINE HCL (PF) 1 % IJ SOLN
INTRAMUSCULAR | Status: AC
  Filled 2020-11-11: qty 30

## 2020-11-11 MED ORDER — FENTANYL CITRATE (PF) 100 MCG/2ML IJ SOLN
INTRAMUSCULAR | Status: AC
  Filled 2020-11-11: qty 2

## 2020-11-11 MED ORDER — HYDRALAZINE HCL 20 MG/ML IJ SOLN
5.0000 mg | INTRAMUSCULAR | Status: DC | PRN
Start: 2020-11-11 — End: 2020-11-27

## 2020-11-11 MED ORDER — LAMIVUDINE 10 MG/ML PO SOLN
50.0000 mg | Freq: Every day | ORAL | Status: DC
  Administered 2020-11-12 – 2020-11-22 (×11): 50 mg via ORAL
  Filled 2020-11-11 (×12): qty 5

## 2020-11-11 MED ORDER — LIDOCAINE-PRILOCAINE 2.5-2.5 % EX CREA: 1.0000 "application " | TOPICAL_CREAM | CUTANEOUS | Status: DC

## 2020-11-11 MED ORDER — VENLAFAXINE HCL ER 75 MG PO CP24
150.0000 mg | ORAL_CAPSULE | Freq: Every day | ORAL | Status: DC
  Administered 2020-11-12 – 2020-11-27 (×16): 150 mg via ORAL
  Filled 2020-11-11 (×16): qty 2

## 2020-11-11 MED ORDER — LIDOCAINE HCL (PF) 1 % IJ SOLN
INTRAMUSCULAR | Status: DC | PRN
  Administered 2020-11-11: 5 mL via INTRADERMAL

## 2020-11-11 MED ORDER — ZIDOVUDINE 100 MG PO CAPS
100.0000 mg | ORAL_CAPSULE | ORAL | Status: DC
  Administered 2020-11-11 – 2020-11-27 (×38): 100 mg via ORAL
  Filled 2020-11-11 (×49): qty 1

## 2020-11-11 MED ORDER — PANTOPRAZOLE SODIUM 40 MG PO TBEC
40.0000 mg | DELAYED_RELEASE_TABLET | Freq: Two times a day (BID) | ORAL | Status: DC
  Administered 2020-11-13 – 2020-11-27 (×24): 40 mg via ORAL
  Filled 2020-11-11 (×23): qty 1

## 2020-11-11 MED ORDER — CYANOCOBALAMIN 1000 MCG/ML IJ SOLN: 1000.0000 ug | INTRAMUSCULAR | Status: DC

## 2020-11-11 MED ORDER — NITROGLYCERIN 0.4 MG SL SUBL
0.4000 mg | SUBLINGUAL_TABLET | SUBLINGUAL | Status: DC | PRN
  Filled 2020-11-11: qty 1

## 2020-11-11 MED ORDER — CLOPIDOGREL BISULFATE 75 MG PO TABS
75.0000 mg | ORAL_TABLET | Freq: Every day | ORAL | Status: DC
  Administered 2020-11-12 – 2020-11-27 (×16): 75 mg via ORAL
  Filled 2020-11-11 (×16): qty 1

## 2020-11-11 MED ORDER — CALCIUM ACETATE (PHOS BINDER) 667 MG PO CAPS
667.0000 mg | ORAL_CAPSULE | ORAL | Status: DC | PRN
  Filled 2020-11-11: qty 1

## 2020-11-11 SURGICAL SUPPLY — 20 items
BAG SNAP BAND KOVER 36X36 (MISCELLANEOUS) ×2 IMPLANT
BALLN LUTONIX AV 10X60X75 (BALLOONS) ×2
BALLN MUSTANG 10.0X40 75 (BALLOONS) ×2
BALLOON LUTONIX AV 10X60X75 (BALLOONS) ×1 IMPLANT
BALLOON MUSTANG 10.0X40 75 (BALLOONS) ×1 IMPLANT
CATH ANGIO 5F BER2 65CM (CATHETERS) ×2 IMPLANT
COVER DOME SNAP 22 D (MISCELLANEOUS) ×2 IMPLANT
DEVICE TORQUE .025-.038 (MISCELLANEOUS) ×2 IMPLANT
GUIDEWIRE ANGLED .035X150CM (WIRE) ×2 IMPLANT
KIT ENCORE 26 ADVANTAGE (KITS) ×2 IMPLANT
KIT MICROPUNCTURE NIT STIFF (SHEATH) ×2 IMPLANT
PROTECTION STATION PRESSURIZED (MISCELLANEOUS) ×2
SHEATH PINNACLE R/O II 6F 4CM (SHEATH) ×2 IMPLANT
SHEATH PINNACLE R/O II 7F 4CM (SHEATH) ×2 IMPLANT
SHEATH PROBE COVER 6X72 (BAG) ×2 IMPLANT
STATION PROTECTION PRESSURIZED (MISCELLANEOUS) ×1 IMPLANT
STOPCOCK MORSE 400PSI 3WAY (MISCELLANEOUS) ×2 IMPLANT
TRAY PV CATH (CUSTOM PROCEDURE TRAY) ×2 IMPLANT
TUBING CIL FLEX 10 FLL-RA (TUBING) ×2 IMPLANT
WIRE STARTER BENTSON 035X150 (WIRE) ×2 IMPLANT

## 2020-11-11 NOTE — Progress Notes (Signed)
Received call from [facility]. Gave update and plan for OR tomorrow. All questions answered. Patient aware that update given. Pt resting with call bell within reach.  Will continue to monitor.

## 2020-11-11 NOTE — Op Note (Signed)
    Patient name: [name] MRN: [id number] DOB: Nov 02, 1950 Sex: female  11/11/2020 Pre-operative Diagnosis: End-stage renal disease Ulceration of right arm AV fistula Post-operative diagnosis:  Same Surgeon:  Eda Paschal. Donzetta Matters, MD Procedure Performed: 1.  Ultrasound-guided cannulation right arm AV fistula 2.  Right upper extremity fistulogram 3.  Drug-coated balloon angioplasty right subclavian and right innominate veins with 10 x 60 mm Lutonix  Indications: 61 female with end-stage renal disease.  She now has significant edema of her right upper extremity with a known right innominate stent in place.  She also has ulceration of the pseudoaneurysm of her right forearm which is concerning for rupture she is indicated for fistulogram with possible intervention.  Findings: Fistula is large throughout her upper arm there is significant edema throughout her upper arm.  The innominate stent was subtotally occluded.  After regular and drug-coated balloon angioplasty we have flow through the stent and decreased reflux up her right IJ although there is still some reflux this appears patent.  We will plan for debridement of right forearm fistula likely conversion to upper arm fistula on the right.   Procedure:  The patient was identified in the holding area and taken to room 8.  The patient was then placed supine on the table and prepped and draped in the usual sterile fashion.  A time out was called.  Ultrasound was used to evaluate the right arm AV fistula.  The area was anesthetized 1% lidocaine cannulated with direct ultrasound visualization with a micropuncture needle followed by wire and sheath.  An image saved per record.  5 extremity fistulogram was performed.  With the above findings we elected to intervene.  We placed a short 7 [place] sheath.  We used a Glidewire and bear catheter to traverse into the SVC and confirmed intraluminal access with central venogram.  We then placed a Bentson wire.   We primarily dilated the stent with a 10 mm balloon.  We then performed drug-coated balloon angioplasty at 10 atm for 3 minutes.  Completion demonstrated brisk flow through the stent with significantly reduced reflux in the right internal jugular vein.  Satisfied with this we removed our wire.  We sutured the cannulation site with 4 Monocryl.  She tolerated procedure without any complication.  Contrast: 60 cc   Carole Deere C. Donzetta Matters, MD [facility] [place] Office: [phone] Pager: [phone]

## 2020-11-11 NOTE — Interval H&P Note (Signed)
History and Physical Interval Note:  11/11/2020 10:06 AM  [name]  has presented today for surgery, with the diagnosis of end stage renal.  The various methods of treatment have been discussed with the patient and family. After consideration of risks, benefits and other options for treatment, the patient has consented to  Procedure(s): A/V FISTULAGRAM (Right) as a surgical intervention.  The patient's history has been reviewed, patient examined, no change in status, stable for surgery.  I have reviewed the patient's chart and labs.  Questions were answered to the patient's satisfaction.     Servando Snare

## 2020-11-12 ENCOUNTER — Encounter (HOSPITAL_COMMUNITY)
Admission: RE | Disposition: A | Discharge status: Skilled Nursing Facility | Payer: Self-pay | Source: Skilled Nursing Facility | Attending: Vascular Surgery

## 2020-11-12 ENCOUNTER — Observation Stay (HOSPITAL_COMMUNITY): Payer: Medicare Other | Admitting: Certified Registered Nurse Anesthetist

## 2020-11-12 ENCOUNTER — Encounter (HOSPITAL_COMMUNITY): Payer: Self-pay | Admitting: Vascular Surgery

## 2020-11-12 DIAGNOSIS — N186 End stage renal disease: Secondary | ICD-10-CM

## 2020-11-12 DIAGNOSIS — T82898A Other specified complication of vascular prosthetic devices, implants and grafts, initial encounter: Secondary | ICD-10-CM

## 2020-11-12 HISTORY — PX: LIGATION OF ARTERIOVENOUS  FISTULA: SHX5948

## 2020-11-12 HISTORY — PX: REVISON OF ARTERIOVENOUS FISTULA: SHX6074

## 2020-11-12 LAB — GLUCOSE, CAPILLARY
Glucose-Capillary: 64 mg/dL — ABNORMAL LOW (ref 70–99)
Glucose-Capillary: 77 mg/dL (ref 70–99)
Glucose-Capillary: 60 mg/dL — ABNORMAL LOW (ref 70–99)
Glucose-Capillary: 159 mg/dL — ABNORMAL HIGH (ref 70–99)
Glucose-Capillary: 137 mg/dL — ABNORMAL HIGH (ref 70–99)

## 2020-11-12 LAB — SARS CORONAVIRUS 2 (TAT 6-24 HRS): SARS Coronavirus 2: NEGATIVE

## 2020-11-12 LAB — SURGICAL PCR SCREEN
MRSA, PCR: POSITIVE — AB
Staphylococcus aureus: POSITIVE — AB

## 2020-11-12 SURGERY — REVISON OF ARTERIOVENOUS FISTULA
Anesthesia: General | Site: Arm Upper | Laterality: Right

## 2020-11-12 MED ORDER — MIDAZOLAM HCL 2 MG/2ML IJ SOLN
INTRAMUSCULAR | Status: AC
  Filled 2020-11-12: qty 2

## 2020-11-12 MED ORDER — GLYCOPYRROLATE PF 0.2 MG/ML IJ SOSY
PREFILLED_SYRINGE | INTRAMUSCULAR | Status: AC
  Filled 2020-11-12: qty 3

## 2020-11-12 MED ORDER — PROPOFOL 10 MG/ML IV BOLUS
INTRAVENOUS | Status: AC
  Filled 2020-11-12: qty 20

## 2020-11-12 MED ORDER — ALTEPLASE 2 MG IJ SOLR: 2.0000 mg | Freq: Once | INTRAMUSCULAR | Status: DC | PRN

## 2020-11-12 MED ORDER — NEOSTIGMINE METHYLSULFATE 3 MG/3ML IV SOSY
PREFILLED_SYRINGE | INTRAVENOUS | Status: DC | PRN
  Administered 2020-11-12: 1 mg via INTRAVENOUS
  Administered 2020-11-12: 3 mg via INTRAVENOUS

## 2020-11-12 MED ORDER — DEXTROSE 50 % IV SOLN
INTRAVENOUS | Status: AC
  Administered 2020-11-12: 25 mL
  Filled 2020-11-12: qty 50

## 2020-11-12 MED ORDER — LABETALOL HCL 5 MG/ML IV SOLN
INTRAVENOUS | Status: DC | PRN
  Administered 2020-11-12: 2.5 mg via INTRAVENOUS

## 2020-11-12 MED ORDER — CISATRACURIUM BESYLATE (PF) 10 MG/5ML IV SOLN
INTRAVENOUS | Status: DC | PRN
  Administered 2020-11-12: 10 mg via INTRAVENOUS

## 2020-11-12 MED ORDER — NEOSTIGMINE METHYLSULFATE 3 MG/3ML IV SOSY
PREFILLED_SYRINGE | INTRAVENOUS | Status: AC
  Filled 2020-11-12: qty 6

## 2020-11-12 MED ORDER — ROCURONIUM BROMIDE 10 MG/ML (PF) SYRINGE
PREFILLED_SYRINGE | INTRAVENOUS | Status: AC
  Filled 2020-11-12: qty 10

## 2020-11-12 MED ORDER — CISATRACURIUM BESYLATE 20 MG/10ML IV SOLN
INTRAVENOUS | Status: AC
  Filled 2020-11-12: qty 10

## 2020-11-12 MED ORDER — LIDOCAINE HCL (PF) 1 % IJ SOLN: 5.0000 mL | INTRAMUSCULAR | Status: DC | PRN

## 2020-11-12 MED ORDER — MIDAZOLAM HCL 5 MG/5ML IJ SOLN
INTRAMUSCULAR | Status: DC | PRN
  Administered 2020-11-12: 1 mg via INTRAVENOUS

## 2020-11-12 MED ORDER — HYDROMORPHONE HCL 1 MG/ML IJ SOLN
0.5000 mg | INTRAMUSCULAR | Status: DC | PRN
  Administered 2020-11-12 – 2020-11-17 (×4): 0.5 mg via INTRAVENOUS
  Filled 2020-11-12 (×4): qty 1

## 2020-11-12 MED ORDER — 0.9 % SODIUM CHLORIDE (POUR BTL) OPTIME
TOPICAL | Status: DC | PRN
  Administered 2020-11-12: 1000 mL

## 2020-11-12 MED ORDER — ACETAMINOPHEN 10 MG/ML IV SOLN: 1000.0000 mg | Freq: Once | INTRAVENOUS | Status: DC | PRN

## 2020-11-12 MED ORDER — VANCOMYCIN HCL IN DEXTROSE 1-5 GM/200ML-% IV SOLN
INTRAVENOUS | Status: AC
  Filled 2020-11-12: qty 200

## 2020-11-12 MED ORDER — HEPARIN SODIUM (PORCINE) 1000 UNIT/ML IJ SOLN
INTRAMUSCULAR | Status: AC
  Filled 2020-11-12: qty 1

## 2020-11-12 MED ORDER — FENTANYL CITRATE (PF) 250 MCG/5ML IJ SOLN
INTRAMUSCULAR | Status: AC
  Filled 2020-11-12: qty 5

## 2020-11-12 MED ORDER — DEXTROSE 50 % IV SOLN
25.0000 g | INTRAVENOUS | Status: AC
  Administered 2020-11-12: 25 g via INTRAVENOUS

## 2020-11-12 MED ORDER — DEXAMETHASONE SODIUM PHOSPHATE 10 MG/ML IJ SOLN
INTRAMUSCULAR | Status: AC
  Filled 2020-11-12: qty 1

## 2020-11-12 MED ORDER — GLYCOPYRROLATE PF 0.2 MG/ML IJ SOSY
PREFILLED_SYRINGE | INTRAMUSCULAR | Status: AC
  Filled 2020-11-12: qty 1

## 2020-11-12 MED ORDER — HEMOSTATIC AGENTS (NO CHARGE) OPTIME
TOPICAL | Status: DC | PRN
  Administered 2020-11-12: 1 via TOPICAL

## 2020-11-12 MED ORDER — ONDANSETRON HCL 4 MG/2ML IJ SOLN
INTRAMUSCULAR | Status: DC | PRN
  Administered 2020-11-12: 4 mg via INTRAVENOUS

## 2020-11-12 MED ORDER — DEXTROSE 50 % IV SOLN
INTRAVENOUS | Status: AC
  Filled 2020-11-12: qty 50

## 2020-11-12 MED ORDER — PROPOFOL 10 MG/ML IV BOLUS
INTRAVENOUS | Status: DC | PRN
  Administered 2020-11-12: 80 mg via INTRAVENOUS

## 2020-11-12 MED ORDER — ONDANSETRON HCL 4 MG/2ML IJ SOLN
INTRAMUSCULAR | Status: AC
  Filled 2020-11-12: qty 2

## 2020-11-12 MED ORDER — AMISULPRIDE (ANTIEMETIC) 5 MG/2ML IV SOLN: 10.0000 mg | Freq: Once | INTRAVENOUS | Status: DC | PRN

## 2020-11-12 MED ORDER — DEXMEDETOMIDINE (PRECEDEX) IN NS 20 MCG/5ML (4 MCG/ML) IV SYRINGE
PREFILLED_SYRINGE | INTRAVENOUS | Status: AC
  Filled 2020-11-12: qty 5

## 2020-11-12 MED ORDER — ORAL CARE MOUTH RINSE: 15.0000 mL | Freq: Once | OROMUCOSAL | Status: AC

## 2020-11-12 MED ORDER — ACETAMINOPHEN 325 MG PO TABS: 325.0000 mg | ORAL_TABLET | ORAL | Status: DC | PRN

## 2020-11-12 MED ORDER — LIDOCAINE 2% (20 MG/ML) 5 ML SYRINGE
INTRAMUSCULAR | Status: DC | PRN
  Administered 2020-11-12: 40 mg via INTRAVENOUS

## 2020-11-12 MED ORDER — LIDOCAINE-PRILOCAINE 2.5-2.5 % EX CREA
1.0000 "application " | TOPICAL_CREAM | CUTANEOUS | Status: DC | PRN
  Filled 2020-11-12: qty 5

## 2020-11-12 MED ORDER — PROMETHAZINE HCL 25 MG/ML IJ SOLN: 6.2500 mg | INTRAMUSCULAR | Status: DC | PRN

## 2020-11-12 MED ORDER — DEXAMETHASONE SODIUM PHOSPHATE 10 MG/ML IJ SOLN
INTRAMUSCULAR | Status: DC | PRN
  Administered 2020-11-12: 5 mg via INTRAVENOUS

## 2020-11-12 MED ORDER — CHLORHEXIDINE GLUCONATE CLOTH 2 % EX PADS
6.0000 | MEDICATED_PAD | Freq: Every day | CUTANEOUS | Status: AC
Start: 2020-11-12 — End: 2020-11-16
  Administered 2020-11-12 – 2020-11-16 (×5): 6 via TOPICAL

## 2020-11-12 MED ORDER — FENTANYL CITRATE (PF) 100 MCG/2ML IJ SOLN
25.0000 ug | INTRAMUSCULAR | Status: DC | PRN
  Administered 2020-11-12 (×2): 50 ug via INTRAVENOUS

## 2020-11-12 MED ORDER — LIDOCAINE-EPINEPHRINE (PF) 1 %-1:200000 IJ SOLN
INTRAMUSCULAR | Status: AC
  Filled 2020-11-12: qty 30

## 2020-11-12 MED ORDER — VANCOMYCIN HCL 1000 MG IV SOLR
INTRAVENOUS | Status: DC | PRN
  Administered 2020-11-12: 1000 mg via INTRAVENOUS

## 2020-11-12 MED ORDER — CHLORHEXIDINE GLUCONATE 0.12 % MT SOLN
OROMUCOSAL | Status: AC
  Administered 2020-11-12: 15 mL via OROMUCOSAL
  Filled 2020-11-12: qty 15

## 2020-11-12 MED ORDER — DEXTROSE 50 % IV SOLN: 12.5000 g | INTRAVENOUS | Status: DC

## 2020-11-12 MED ORDER — SODIUM CHLORIDE 0.9 % IV SOLN: 100.0000 mL | INTRAVENOUS | Status: DC | PRN

## 2020-11-12 MED ORDER — FENTANYL CITRATE (PF) 250 MCG/5ML IJ SOLN
INTRAMUSCULAR | Status: DC | PRN
  Administered 2020-11-12 (×3): 50 ug via INTRAVENOUS

## 2020-11-12 MED ORDER — HEPARIN SODIUM (PORCINE) 1000 UNIT/ML DIALYSIS
1000.0000 [IU] | INTRAMUSCULAR | Status: DC | PRN
  Filled 2020-11-12: qty 1

## 2020-11-12 MED ORDER — MUPIROCIN 2 % EX OINT
1.0000 "application " | TOPICAL_OINTMENT | Freq: Two times a day (BID) | CUTANEOUS | Status: AC
  Administered 2020-11-12 – 2020-11-16 (×10): 1 via NASAL
  Filled 2020-11-12 (×3): qty 22

## 2020-11-12 MED ORDER — CHLORHEXIDINE GLUCONATE 0.12 % MT SOLN: 15.0000 mL | Freq: Once | OROMUCOSAL | Status: AC

## 2020-11-12 MED ORDER — FENTANYL CITRATE (PF) 100 MCG/2ML IJ SOLN
INTRAMUSCULAR | Status: AC
  Filled 2020-11-12: qty 2

## 2020-11-12 MED ORDER — ACETAMINOPHEN 160 MG/5ML PO SOLN: 325.0000 mg | ORAL | Status: DC | PRN

## 2020-11-12 MED ORDER — ALBUMIN HUMAN 5 % IV SOLN: INTRAVENOUS | Status: DC | PRN

## 2020-11-12 MED ORDER — PENTAFLUOROPROP-TETRAFLUOROETH EX AERO: 1.0000 "application " | INHALATION_SPRAY | CUTANEOUS | Status: DC | PRN

## 2020-11-12 MED ORDER — HEPARIN 6000 UNIT IRRIGATION SOLUTION
Status: DC | PRN
  Administered 2020-11-12: 1

## 2020-11-12 MED ORDER — LIDOCAINE 2% (20 MG/ML) 5 ML SYRINGE
INTRAMUSCULAR | Status: AC
  Filled 2020-11-12: qty 5

## 2020-11-12 MED ORDER — GLYCOPYRROLATE PF 0.2 MG/ML IJ SOSY
PREFILLED_SYRINGE | INTRAMUSCULAR | Status: DC | PRN
  Administered 2020-11-12: .4 mg via INTRAVENOUS
  Administered 2020-11-12: .2 mg via INTRAVENOUS

## 2020-11-12 MED ORDER — HEPARIN 6000 UNIT IRRIGATION SOLUTION
Status: AC
  Filled 2020-11-12: qty 500

## 2020-11-12 MED ORDER — SODIUM CHLORIDE 0.9 % IV SOLN: INTRAVENOUS | Status: DC

## 2020-11-12 SURGICAL SUPPLY — 38 items
ARMBAND PINK RESTRICT EXTREMIT (MISCELLANEOUS) ×2 IMPLANT
BAG COUNTER SPONGE SURGICOUNT (BAG) ×2 IMPLANT
BNDG ELASTIC 4X5.8 VLCR STR LF (GAUZE/BANDAGES/DRESSINGS) ×2 IMPLANT
BNDG GAUZE ELAST 4 BULKY (GAUZE/BANDAGES/DRESSINGS) ×2 IMPLANT
CANISTER SUCT 3000ML PPV (MISCELLANEOUS) ×2 IMPLANT
CLIP LIGATING EXTRA MED SLVR (CLIP) ×2 IMPLANT
CLIP LIGATING EXTRA SM BLUE (MISCELLANEOUS) ×2 IMPLANT
CLIP VESOCCLUDE MED 6/CT (CLIP) ×2 IMPLANT
CLIP VESOCCLUDE SM WIDE 6/CT (CLIP) ×2 IMPLANT
COVER PROBE W GEL 5X96 (DRAPES) ×2 IMPLANT
DERMABOND ADVANCED (GAUZE/BANDAGES/DRESSINGS) ×1
DERMABOND ADVANCED .7 DNX12 (GAUZE/BANDAGES/DRESSINGS) ×1 IMPLANT
DRSG TELFA 3X8 NADH (GAUZE/BANDAGES/DRESSINGS) ×2 IMPLANT
ELECT REM PT RETURN 9FT ADLT (ELECTROSURGICAL) ×2
ELECTRODE REM PT RTRN 9FT ADLT (ELECTROSURGICAL) ×1 IMPLANT
GAUZE 4X4 16PLY ~~LOC~~+RFID DBL (SPONGE) ×2 IMPLANT
GAUZE SPONGE 4X4 12PLY STRL (GAUZE/BANDAGES/DRESSINGS) ×4 IMPLANT
GLOVE SURG ENC MOIS LTX SZ7.5 (GLOVE) ×2 IMPLANT
GOWN STRL REUS W/ TWL LRG LVL3 (GOWN DISPOSABLE) ×2 IMPLANT
GOWN STRL REUS W/ TWL XL LVL3 (GOWN DISPOSABLE) ×1 IMPLANT
GOWN STRL REUS W/TWL LRG LVL3 (GOWN DISPOSABLE) ×4
GOWN STRL REUS W/TWL XL LVL3 (GOWN DISPOSABLE) ×2
KIT BASIN OR (CUSTOM PROCEDURE TRAY) ×2 IMPLANT
KIT TURNOVER KIT B (KITS) ×2 IMPLANT
NS IRRIG 1000ML POUR BTL (IV SOLUTION) ×2 IMPLANT
PACK CV ACCESS (CUSTOM PROCEDURE TRAY) ×2 IMPLANT
PAD ARMBOARD 7.5X6 YLW CONV (MISCELLANEOUS) ×4 IMPLANT
POWDER SURGICEL 3.0 GRAM (HEMOSTASIS) ×2 IMPLANT
SPONGE T-LAP 18X18 ~~LOC~~+RFID (SPONGE) ×2 IMPLANT
STAPLER VISISTAT 35W (STAPLE) ×2 IMPLANT
SUT MNCRL AB 4-0 PS2 18 (SUTURE) ×4 IMPLANT
SUT PROLENE 5 0 C 1 24 (SUTURE) ×10 IMPLANT
SUT PROLENE 6 0 BV (SUTURE) ×4 IMPLANT
SUT VIC AB 3-0 SH 27 (SUTURE) ×6
SUT VIC AB 3-0 SH 27X BRD (SUTURE) ×3 IMPLANT
TOWEL GREEN STERILE (TOWEL DISPOSABLE) ×2 IMPLANT
UNDERPAD 30X36 HEAVY ABSORB (UNDERPADS AND DIAPERS) ×2 IMPLANT
WATER STERILE IRR 1000ML POUR (IV SOLUTION) ×2 IMPLANT

## 2020-11-12 NOTE — Op Note (Signed)
Patient name: [name] MRN: [id number] DOB: Sep 12, 1950 Sex: female  11/12/2020 Pre-operative Diagnosis: End-stage renal disease, necrotic right arm AV fistula Post-operative diagnosis:  Same Surgeon:  Eda Paschal. Donzetta Matters, MD Assistant: Arlee Muslim, PA Procedure Performed: 1.  Ligation and resection of right arm radiocephalic AV fistula 2.  Turndown of upper arm cephalic vein to brachial artery creation of brachial cephalic AV fistula   Indications: 70 year old female with history of end-stage renal disease on dialysis via right upper extremity fistula.  The day prior to this she underwent drug-coated balloon angioplasty of her right innominate vein stent for in-stent stenosis.  Swelling has improved.  She has a necrotic pseudoaneurysm in the forearm she is indicated for resection and conversion to upper arm AV fistula.  An assistant was necessary to facilitate exposure and expedite the case.  Findings: Fistula above the antecubitum was large this was turned down to the very large brachial artery measuring approximately 1 cm in diameter.  At completion there was a very strong thrill in his fistula.  We ligated the fistula proximally and distally at the wrist and at the antecubitum.  There were multiple large collaterals which were also ligated.  We resected the necrotic pseudoaneurysm in the forearm.  There was significant oozing given patient's platelet dysfunction with dialysis.  We did pack the wound where the necrotic fistula was resected with wet-to-dry Kerlix.  At completion there was a very strong thrill in the upper arm AV fistula and a palpable radial artery pulse the wrist.   Procedure:  The patient was identified in the holding area and taken to the operating where she was put supine operative when general anesthesia was induced.  She was gently prepped draped in the right upper extremity usual fashion, antibiotics were minister timeout was called.  Ultrasound was used to  identify a very large brachial artery just below the antecubitum as well as the fistula where it tapered down to a reasonable size at the level of the antecubitum.  Transverse incision was made between these 2 areas.  We first dissected out the vein marked disorientation placed a vessel loop around it.  We dissected through the deep fascia of the brachial artery placed a vessel loop around this.  We then clamped the fistula at the antecubitum proximally distally.  We transected it.  We oversewed with 5-0 Prolene suture in a mattress fashion proximally.  Distally flushed with heparinized saline and clamped it.  We clamped the brachial artery distally proximally opened longitudinally flushed heparinized in a distal fashion.  We then sewed the vein end-to-side with 5-0 Prolene suture.  Prior completion without flushing all directions.  Possibly there was a very strong thrill in the fistula.  We still palpable radial artery pulse the wrist.  We opened her previous incision at the wrist using 10 blade.  We dissected down to the proximal anastomosis of the AV fistula placed a vessel loop around this.  We then clamped proximally distally and transected.  We oversewed with 5-0 Prolene suture and we tested radial artery pulse was still intact.  Prolene suture was in a running mattress fashion.  We then excised the necrotic area of the fistula with elliptical type incision.  We dissected out the entirety of the fistula and excise most of the fistula tying off the proximal and distal ends.  We obtain hemostasis we thoroughly irrigated the wounds.  There were multiple collaterals that we did have to tie off and the process of  mobilizing the fistula.  We then closed the wrist incision antecubital incision with interrupted 3-0 Vicryl in place staples at the skin level.  The resection incision we are able to close partially with interrupted 3-0 Vicryl and then placed staples there was a 3 cm area left open and packed with  wet-to-dry Kerlix.  The arm was then wrapped with Kerlix and Ace wrap.  She was awakened from anesthesia having tolerated procedure without any complication.  All counts were correct at completion.  EBL: 700 cc   Jericho Alcorn C. Donzetta Matters, MD [facility] [place] Office: [phone] Pager: [phone]

## 2020-11-12 NOTE — Transfer of Care (Signed)
Immediate Anesthesia Transfer of Care Note  Patient: [name]  Procedure(s) Performed: CREATION OF RIGHT ARM ARTERIOVENOUS FISTULA (Right: Arm Upper) LIGATION OF ARTERIOVENOUS  FISTULA WITH EXCISION OF NECROTIC TISSUE (Right: Arm Upper)  Patient Location: PACU  Anesthesia Type:General  Level of Consciousness: awake, alert  and patient cooperative  Airway & Oxygen Therapy: Patient Spontanous Breathing and Patient connected to face mask oxygen  Post-op Assessment: Report given to RN and Post -op Vital signs reviewed and stable  Post vital signs: Reviewed  Last Vitals:  Vitals Value Taken Time  BP 186/96 11/12/20 1738  Temp    Pulse 72 11/12/20 1738  Resp 14 11/12/20 1738  SpO2 100 % 11/12/20 1738  Vitals shown include unvalidated device data.  Last Pain:  Vitals:   11/12/20 1324  TempSrc: Oral  PainSc:       Patients Stated Pain Goal: 3 (59/09/31 1216)  Complications: No notable events documented.

## 2020-11-12 NOTE — Anesthesia Procedure Notes (Signed)
Procedure Name: Intubation Date/Time: 11/12/2020 3:30 PM Performed by: Jenne Campus, CRNA Pre-anesthesia Checklist: Patient identified, Emergency Drugs available, Suction available and Patient being monitored Patient Re-evaluated:Patient Re-evaluated prior to induction Oxygen Delivery Method: Circle System Utilized Preoxygenation: Pre-oxygenation with 100% oxygen Induction Type: IV induction Ventilation: Mask ventilation without difficulty Laryngoscope Size: Miller and 3 Grade View: Grade I Tube type: Oral Tube size: 7.0 mm Number of attempts: 1 Airway Equipment and Method: Stylet and Oral airway Placement Confirmation: ETT inserted through vocal cords under direct vision, positive ETCO2 and breath sounds checked- equal and bilateral Secured at: 20 cm Tube secured with: Tape Dental Injury: Teeth and Oropharynx as per pre-operative assessment

## 2020-11-12 NOTE — Consult Note (Signed)
Reason for Consult: ESRD Referring Physician: Dr Donzetta Matters  Chief Complaint: Arm swelling and ulceration over rt Cimino fistula  Outpatient records not available, previous admission her outpatient orders were: Javier Docker, TTS EDW 46kg HD Bath 3K/2.5Ca  Time 3 hours Heparin 1000 bolus and 500 units/hr. Access Rt AVF BFR 350 DFR 600     Assessment/Plan:  ESRD:  -no indication for dialysis today, next treatment tentatively planned for Wed AM 1st shift -will need to obtain outpatient records - Dose all meds for creatinine clearance < 10 ml/min  - Unless absolutely necessary, no MRIs with gadolinium.   Volume/ hypertension: EDW 46kg. UF as tolerated Anemia of Chronic Kidney Disease: Hemoglobin 9.3  on presentation; Not sure which she is receiving at her outpatient unit i.e. iron versus ESA, will need to obtain outpatient records  Secondary Hyperparathyroidism/Hyperphosphatemia: Continue with binders; will check a phos for binder management.  Vascular access: Right upper extremity fistula with good bruit, slightly pulsatile s/p rt cimino resection + ligation prox/distal + collaterals.   HPI: [name] is an 70 y.o. female HIV HTN pulmonary htn, TIA, chronic pain, aflutter, ESRD TTS at [facility]  treated with abx on dialysis for possible infection of the right arm pseudoaneurysm although she denied fever, chills; she has h/o right innominate vein stent. She had a fistulogram 11/11/20 with DCB 10x6 to the right SCV and innominate vein for significant edema in the right upper extremity. Post PTA there was some flow through the stent but still some reflux up the jugular vein. On 9/13 she had ligation and resection of the rt cimino fistula given necrotic pseudoaneurysm with conversion to upper arm fistula.   ROS Pertinent items are noted in HPI.  Chemistry and CBC: Creat  Date/Time Value Ref Range Status  12/22/2019 11:17 AM 5.66 (H) 0.50 - 0.99 mg/dL Final    Comment:    For patients >73  years of age, the reference limit for Creatinine is approximately 13% higher for people identified as African-American. Marland Kitchen   10/19/2017 02:19 PM 7.19 (H) 0.50 - 0.99 mg/dL Final    Comment:    For patients >54 years of age, the reference limit for Creatinine is approximately 13% higher for people identified as African-American. Marland Kitchen   04/22/2017 02:17 PM 5.89 (H) 0.50 - 0.99 mg/dL Final    Comment:    For patients >71 years of age, the reference limit for Creatinine is approximately 13% higher for people identified as African-American. Marland Kitchen   07/23/2015 04:52 PM 7.12 (H) 0.50 - 0.99 mg/dL Final  02/12/2015 10:00 AM 8.54 (H) 0.50 - 0.99 mg/dL Final    Comment:    Result repeated and verified.   Creatinine, Ser  Date/Time Value Ref Range Status  11/11/2020 06:39 PM 11.29 (H) 0.44 - 1.00 mg/dL Final  11/11/2020 10:17 AM 11.40 (H) 0.44 - 1.00 mg/dL Final  07/11/2020 09:21 AM 9.70 (H) 0.44 - 1.00 mg/dL Final  01/02/2020 12:20 PM 9.60 (H) 0.44 - 1.00 mg/dL Final  11/16/2019 06:40 AM 12.70 (H) 0.44 - 1.00 mg/dL Final  10/22/2019 01:05 AM 5.36 (H) 0.44 - 1.00 mg/dL Final    Comment:    DELTA CHECK NOTED  10/20/2019 11:30 PM 10.99 (H) 0.44 - 1.00 mg/dL Final  10/18/2019 09:41 AM 6.15 (H) 0.44 - 1.00 mg/dL Final  09/29/2019 01:59 PM 5.17 (H) 0.44 - 1.00 mg/dL Final  09/15/2019 08:14 AM 4.17 (H) 0.44 - 1.00 mg/dL Final  09/06/2019 10:07 AM 5.22 (H) 0.44 - 1.00 mg/dL  Final  08/28/2019 06:59 AM 6.60 (H) 0.44 - 1.00 mg/dL Final  07/21/2019 05:10 AM 5.81 (H) 0.44 - 1.00 mg/dL Final    Comment:    DELTA CHECK NOTED  07/20/2019 04:47 AM 9.70 (H) 0.44 - 1.00 mg/dL Final  07/19/2019 11:04 AM 9.29 (H) 0.44 - 1.00 mg/dL Final  07/12/2019 01:30 PM 5.81 (H) 0.44 - 1.00 mg/dL Final  03/14/2018 12:50 PM 14.70 (H) 0.44 - 1.00 mg/dL Final  07/23/2017 04:12 PM 5.64 (H) 0.44 - 1.00 mg/dL Final  03/18/2017 07:39 PM 11.30 (H) 0.44 - 1.00 mg/dL Final  04/17/2016 02:23 AM 7.84 (H) 0.44 - 1.00 mg/dL Final   04/16/2016 04:05 PM 6.67 (H) 0.44 - 1.00 mg/dL Final  02/22/2015 05:13 AM 11.21 (H) 0.44 - 1.00 mg/dL Final  02/21/2015 05:47 AM 9.15 (H) 0.44 - 1.00 mg/dL Final  02/20/2015 08:48 AM 18.34 (H) 0.44 - 1.00 mg/dL Final  03/27/2014 07:53 AM 17.87 (H) 0.50 - 1.10 mg/dL Final  03/26/2014 05:23 AM 15.62 (H) 0.50 - 1.10 mg/dL Final  03/25/2014 03:55 PM 13.80 (H) 0.50 - 1.10 mg/dL Final  03/25/2014 03:42 PM 15.01 (H) 0.50 - 1.10 mg/dL Final  03/23/2014 11:55 AM 10.74 (H) 0.50 - 1.10 mg/dL Final  12/23/2013 12:49 PM 8.55 (H) 0.50 - 1.10 mg/dL Final  10/19/2013 08:50 AM 8.00 (H) 0.50 - 1.10 mg/dL Final  08/18/2013 08:33 AM 9.26 (H) 0.50 - 1.10 mg/dL Final  12/31/2011 07:40 AM 8.27 (H) 0.50 - 1.10 mg/dL Final  12/29/2011 05:25 AM 11.24 (H) 0.50 - 1.10 mg/dL Final  12/28/2011 09:25 PM 10.37 (H) 0.50 - 1.10 mg/dL Final  12/28/2011 11:47 AM 9.87 (H) 0.50 - 1.10 mg/dL Final  10/10/2011 06:09 AM 10.70 (H) 0.50 - 1.10 mg/dL Final    Comment:    DELTA CHECK NOTED  10/09/2011 04:48 AM 6.98 (H) 0.50 - 1.10 mg/dL Final  10/08/2011 04:40 AM 11.25 (H) 0.50 - 1.10 mg/dL Final  10/07/2011 11:30 AM 9.62 (H) 0.50 - 1.10 mg/dL Final  09/23/2011 06:03 AM 10.74 (H) 0.50 - 1.10 mg/dL Final  09/22/2011 05:08 AM 7.79 (H) 0.50 - 1.10 mg/dL Final  09/21/2011 10:50 AM 10.89 (H) 0.50 - 1.10 mg/dL Final  09/21/2011 05:00 AM 10.36 (H) 0.50 - 1.10 mg/dL Final  09/20/2011 08:40 PM 9.51 (H) 0.50 - 1.10 mg/dL Final  03/12/2011 05:27 AM 11.27 (H) 0.50 - 1.10 mg/dL Final  03/11/2011 05:19 AM 8.57 (H) 0.50 - 1.10 mg/dL Final  03/10/2011 05:16 AM 14.92 (H) 0.50 - 1.10 mg/dL Final  03/09/2011 03:00 PM 14.01 (H) 0.50 - 1.10 mg/dL Final  12/16/2010 05:54 AM 8.59 (H) 0.50 - 1.10 mg/dL Final  12/15/2010 05:00 AM 11.66 (H) 0.50 - 1.10 mg/dL Final  12/13/2010 09:48 PM 8.30 (H) 0.50 - 1.10 mg/dL Final   Recent Labs  Lab 11/11/20 1017 11/11/20 1839  NA 141  --   K 4.2  --   CL 100  --   GLUCOSE 78  --   BUN 49*  --    CREATININE 11.40* 11.29*   Recent Labs  Lab 11/11/20 1017 11/11/20 1839  WBC  --  2.8*  HGB 11.6* 9.3*  HCT 34.0* 31.2*  MCV  --  106.1*  PLT  --  144*   Liver Function Tests: No results for input(s): AST, ALT, ALKPHOS, BILITOT, PROT, ALBUMIN in the last 168 hours. No results for input(s): LIPASE, AMYLASE in the last 168 hours. No results for input(s): AMMONIA in the last 168 hours. Cardiac Enzymes: No results for  input(s): CKTOTAL, CKMB, CKMBINDEX, TROPONINI in the last 168 hours. Iron Studies: No results for input(s): IRON, TIBC, TRANSFERRIN, FERRITIN in the last 72 hours. PT/INR: _0 (inr:5)  Xrays/Other Studies: ) Results for orders placed or performed during the hospital encounter of 11/11/20 (from the past 48 hour(s))  I-STAT, chem 8     Status: Abnormal   Collection Time: 11/11/20 10:17 AM  Result Value Ref Range   Sodium 141 135 - 145 mmol/L   Potassium 4.2 3.5 - 5.1 mmol/L   Chloride 100 98 - 111 mmol/L   BUN 49 (H) 8 - 23 mg/dL   Creatinine, Ser 11.40 (H) 0.44 - 1.00 mg/dL   Glucose, Bld 78 70 - 99 mg/dL    Comment: Glucose reference range applies only to samples taken after fasting for at least 8 hours.   Calcium, Ion 1.27 1.15 - 1.40 mmol/L   TCO2 31 22 - 32 mmol/L   Hemoglobin 11.6 (L) 12.0 - 15.0 g/dL   HCT 34.0 (L) 36.0 - 46.0 %  CBC     Status: Abnormal   Collection Time: 11/11/20  6:39 PM  Result Value Ref Range   WBC 2.8 (L) 4.0 - 10.5 K/uL   RBC 2.94 (L) 3.87 - 5.11 MIL/uL   Hemoglobin 9.3 (L) 12.0 - 15.0 g/dL   HCT 31.2 (L) 36.0 - 46.0 %   MCV 106.1 (H) 80.0 - 100.0 fL   MCH 31.6 26.0 - 34.0 pg   MCHC 29.8 (L) 30.0 - 36.0 g/dL   RDW 14.7 11.5 - 15.5 %   Platelets 144 (L) 150 - 400 K/uL   nRBC 0.0 0.0 - 0.2 %    Comment: Performed at [facility], [place] [address], [place], [place] [address]  Creatinine, serum     Status: Abnormal   Collection Time: 11/11/20  6:39 PM  Result Value Ref Range   Creatinine, Ser 11.29 (H) 0.44 - 1.00  mg/dL   GFR, Estimated 3 (L) >60 mL/min    Comment: (NOTE) Calculated using the CKD-EPI Creatinine Equation (2021) Performed at [facility] [address] [place],  [address]   Glucose, capillary     Status: None   Collection Time: 11/11/20  8:17 PM  Result Value Ref Range   Glucose-Capillary 71 70 - 99 mg/dL    Comment: Glucose reference range applies only to samples taken after fasting for at least 8 hours.  SARS CORONAVIRUS 2 (TAT 6-24 HRS) Nasopharyngeal Nasopharyngeal Swab     Status: None   Collection Time: 11/12/20  3:06 AM   Specimen: Nasopharyngeal Swab  Result Value Ref Range   SARS Coronavirus 2 NEGATIVE NEGATIVE    Comment: (NOTE) SARS-CoV-2 target nucleic acids are NOT DETECTED.  The SARS-CoV-2 RNA is generally detectable in upper and lower respiratory specimens during the acute phase of infection. Negative results do not preclude SARS-CoV-2 infection, do not rule out co-infections with other pathogens, and should not be used as the sole basis for treatment or other patient management decisions. Negative results must be combined with clinical observations, patient history, and epidemiological information. The expected result is Negative.  Fact Sheet for Patients: SugarRoll.be  Fact Sheet for Healthcare Providers: https://www.woods-mathews.com/  This test is not yet approved or cleared by the [place] FDA and  has been authorized for detection and/or diagnosis of SARS-CoV-2 by FDA under an Emergency Use Authorization (EUA). This EUA will remain  in effect (meaning this test can be used) for the duration  of the COVID-19 declaration under Se ction 564(b)(1) of the Act, 21 U.S.C. section 360bbb-3(b)(1), unless the authorization is terminated or revoked sooner.  Performed at [facility], [place] [address] [place], [place] [address]   Surgical pcr screen     Status: Abnormal   Collection Time:  11/12/20  4:52 AM   Specimen: Nasal Mucosa; Nasal Swab  Result Value Ref Range   MRSA, PCR POSITIVE (A) NEGATIVE    Comment: RESULT CALLED TO, READ BACK BY AND VERIFIED WITH: RN KERI DAVIDSON 11/12/20_0 :32 BY TW    Staphylococcus aureus POSITIVE (A) NEGATIVE    Comment: RESULT CALLED TO, READ BACK BY AND VERIFIED WITH: RN Kristin Bruins DAVIDSON 11/12/20_1 :32 BY TW (NOTE) The Xpert SA Assay (FDA approved for NASAL specimens in patients 20 years of age and older), is one component of a comprehensive surveillance program. It is not intended to diagnose infection nor to guide or monitor treatment. Performed at [facility], [place] [address] [place], [place] [address]   Glucose, capillary     Status: Abnormal   Collection Time: 11/12/20  5:58 AM  Result Value Ref Range   Glucose-Capillary 64 (L) 70 - 99 mg/dL    Comment: Glucose reference range applies only to samples taken after fasting for at least 8 hours.  Glucose, capillary     Status: None   Collection Time: 11/12/20  7:43 AM  Result Value Ref Range   Glucose-Capillary 77 70 - 99 mg/dL    Comment: Glucose reference range applies only to samples taken after fasting for at least 8 hours.   Comment 1 Document in Chart   Glucose, capillary     Status: Abnormal   Collection Time: 11/12/20 11:49 AM  Result Value Ref Range   Glucose-Capillary 60 (L) 70 - 99 mg/dL    Comment: Glucose reference range applies only to samples taken after fasting for at least 8 hours.  Glucose, capillary     Status: Abnormal   Collection Time: 11/12/20 12:50 PM  Result Value Ref Range   Glucose-Capillary 159 (H) 70 - 99 mg/dL    Comment: Glucose reference range applies only to samples taken after fasting for at least 8 hours.   PERIPHERAL VASCULAR CATHETERIZATION  Result Date: 11/11/2020 Images from the original result were not included. Patient name: ARIBELLE MCCOSH MRN: [id number] DOB: 03-06-1950 Sex: female 11/11/2020 Pre-operative Diagnosis: End-stage  renal disease Ulceration of right arm AV fistula Post-operative diagnosis:  Same Surgeon:  Eda Paschal. Donzetta Matters, MD Procedure Performed: 1.  Ultrasound-guided cannulation right arm AV fistula 2.  Right upper extremity fistulogram 3.  Drug-coated balloon angioplasty right subclavian and right innominate veins with 10 x 60 mm Lutonix Indications: 37 female with end-stage renal disease.  She now has significant edema of her right upper extremity with a known right innominate stent in place.  She also has ulceration of the pseudoaneurysm of her right forearm which is concerning for rupture she is indicated for fistulogram with possible intervention. Findings: Fistula is large throughout her upper arm there is significant edema throughout her upper arm.  The innominate stent was subtotally occluded.  After regular and drug-coated balloon angioplasty we have flow through the stent and decreased reflux up her right IJ although there is still some reflux this appears patent. We will plan for debridement of right forearm fistula likely conversion to upper arm fistula on the right.  Procedure:  The patient was identified in the holding area and taken to room  8.  The patient was then placed supine on the table and prepped and draped in the usual sterile fashion.  A time out was called.  Ultrasound was used to evaluate the right arm AV fistula.  The area was anesthetized 1% lidocaine cannulated with direct ultrasound visualization with a micropuncture needle followed by wire and sheath.  An image saved per record.  5 extremity fistulogram was performed.  With the above findings we elected to intervene.  We placed a short 7 [place] sheath.  We used a Glidewire and bear catheter to traverse into the SVC and confirmed intraluminal access with central venogram.  We then placed a Bentson wire.  We primarily dilated the stent with a 10 mm balloon.  We then performed drug-coated balloon angioplasty at 10 atm for 3 minutes.  Completion  demonstrated brisk flow through the stent with significantly reduced reflux in the right internal jugular vein.  Satisfied with this we removed our wire.  We sutured the cannulation site with 4 Monocryl.  She tolerated procedure without any complication. Contrast: 60 cc Brandon C. Donzetta Matters, MD [facility] Office: [phone] Pager: [phone]    PMH:   Past Medical History:  Diagnosis Date   Anemia    Anxiety    Atrial flutter ([place]) 02/22/2015   C. difficile colitis 10/10/11   Chronic diarrhea    Chronic pain    Depression    ESRD on hemodialysis [facility])    Dialysis [name]   GERD (gastroesophageal reflux disease)    HIV (human immunodeficiency virus infection) ([place])    Hypertension    Insomnia    Intracranial hemorrhage (H[facility])    Muscle weakness (generalized)    Pulmonary HTN ([place])    Tachycardia    TIA (transient ischemic attack)    [facility] do not have this dx   Traumatic hematoma of right upper arm 02/22/2015    PSH:   Past Surgical History:  Procedure Laterality Date   A/V FISTULAGRAM N/A 04/17/2016   Procedure: A/V Fistulagram - Right Upper;  Surgeon: Waynetta Sandy, MD;  Location: [place] [facility];  Service: Cardiovascular;  Laterality: N/A;   A/V FISTULAGRAM Right 08/28/2019   Procedure: A/V FISTULAGRAM;  Surgeon: Waynetta Sandy, MD;  Location: [place] [facility];  Service: Cardiovascular;  Laterality: Right;   ABDOMINAL HYSTERECTOMY     BIOPSY  01/02/2020   Procedure: BIOPSY;  Surgeon: Eloise Harman, DO;  [facility];  Service: Endoscopy;;   BIOPSY  07/11/2020   Procedure: BIOPSY;  Surgeon: Irving Copas., MD;  Location: [place];  Service: Gastroenterology;;   BIOPSY THYROID     CARPAL TUNNEL RELEASE Right 11/16/2019   Procedure: CARPAL TUNNEL RELEASE;  Surgeon: Leanora Cover, MD;  Location: [place];  Service: Orthopedics;  Laterality: Right;   COLONOSCOPY WITH PROPOFOL  N/A 01/02/2020   Procedure: COLONOSCOPY WITH PROPOFOL;  Surgeon: Eloise Harman, DO;  [facility];  Service: Endoscopy;  Laterality: N/A;  1:15pm-pt at [facility]   Dialysis Shunts     previous one removed from left arm and now present one in right arm   DIALYSIS/PERMA CATHETER INSERTION Right 04/17/2016   Procedure: dialysis Catheter Insertion central veinous;  Surgeon: Waynetta Sandy, MD;  Location: [place] [facility];  Service: Cardiovascular;  Laterality: Right;   ESOPHAGOGASTRODUODENOSCOPY  12/15/2010   Rourk: erosive reflux esophagitis, MW tear, hiatal hernia (small), gastritis with no h.pylori,  possibly nsaid related   ESOPHAGOGASTRODUODENOSCOPY (EGD) WITH PROPOFOL N/A 07/11/2020    Surgeon: Irving Copas., MD; normal esophagus, 4 cm hiatal hernia, gastritis biopsied (mild chronic gastritis, negative H. pylori), single duodenal polyp (Brunner's gland hyperplasia), s/p duodenal biopsy benign, duodenal narrowing at the duodenal sweep.   EUS N/A 07/11/2020    Surgeon: Irving Copas., MD;  dilated CBD and common hepatic duct without stones or sludge in the bile duct, stones and biliary sludge in the gallbladder, pancreatic parenchyma with hyperechoic strands, pancreatic duct dilated with prominent sidebranches in the neck and body and tail, no pancreatic mass though visulalization limited, no ampullary lesion, no malignant appearing lymph node   EXCISION OF BREAST BIOPSY Right 05/04/2012   Procedure: EXCISION OF BREAST BIOPSY;  Surgeon: Donato Heinz, MD;  Location: [facility];  Service: General;  Laterality: Right;  Right Excisional Breast Biopsy   FISTULOGRAM Right 03/26/2014   Procedure: Right Arm Fistulogram with Venoplasty Right Subclavian Vein and Inominate Vein. Debridement Fistula Ulcer;  Surgeon: Conrad [place], MD;  Location: [place];  Service: Vascular;  Laterality: Right;   FISTULOGRAM Right 03/29/2017   Procedure: FISTULOGRAM COMPLEX RIGHT ARM  with Balloon angioplasty;  Surgeon: Waynetta Sandy, MD;  Location: [place];  Service: Vascular;  Laterality: Right;   HEMOSTASIS CLIP PLACEMENT  07/11/2020   Procedure: HEMOSTASIS CLIP PLACEMENT;  Surgeon: Irving Copas., MD;  Location: [place];  Service: Gastroenterology;;   IR GENERIC HISTORICAL  05/19/2016   IR REMOVAL TUN CV CATH W/O FL 05/19/2016 Markus Daft, MD [facility]-INTERV RAD   PERIPHERAL VASCULAR BALLOON ANGIOPLASTY Right 04/17/2016   Procedure: Peripheral Vascular Balloon Angioplasty;  Surgeon: Waynetta Sandy, MD;  Location: [place] [facility];  Service: Cardiovascular;  Laterality: Right;  Central segment AV Fistula   PERIPHERAL VASCULAR BALLOON ANGIOPLASTY Right 03/14/2018   Procedure: PERIPHERAL VASCULAR BALLOON ANGIOPLASTY;  Surgeon: Waynetta Sandy, MD;  Location: [place] [facility];  Service: Cardiovascular;  Laterality: Right;  subclavian vein   PERIPHERAL VASCULAR BALLOON ANGIOPLASTY Right 08/28/2019   Procedure: PERIPHERAL VASCULAR BALLOON ANGIOPLASTY;  Surgeon: Waynetta Sandy, MD;  Location: [place] [facility];  Service: Cardiovascular;  Laterality: Right;  upper arm   PERIPHERAL VASCULAR BALLOON ANGIOPLASTY Right 11/11/2020   Procedure: PERIPHERAL VASCULAR BALLOON ANGIOPLASTY;  Surgeon: Waynetta Sandy, MD;  Location: [place] [facility];  Service: Cardiovascular;  Laterality: Right;   PERIPHERAL VASCULAR INTERVENTION Right 03/14/2018   Procedure: PERIPHERAL VASCULAR INTERVENTION;  Surgeon: Waynetta Sandy, MD;  Location: [place] [facility];  Service: Cardiovascular;  Laterality: Right;  subclavian vein   POLYPECTOMY  07/11/2020   Procedure: POLYPECTOMY;  Surgeon: Mansouraty, Telford Nab., MD;  Location: [place];  Service: Gastroenterology;;   Earney Mallet Right 10/19/2013   Procedure: FISTULOGRAM;  Surgeon: Conrad [place], MD;  Location: [facility] C[facility] LAB;  Service: Cardiovascular;  Laterality: Right;   TONSILLECTOMY      VISCERAL VENOGRAPHY Right 03/14/2018   Procedure: CENTRAL VENOGRAPHY;  Surgeon: Waynetta Sandy, MD;  Location: [place] [facility];  Service: Cardiovascular;  Laterality: Right;  upper ext fistula    Allergies:  Allergies  Allergen Reactions   Lactose Intolerance (Gi) Other (See Comments)    On MAR   Latex Rash and Itching   Penicillins Rash and Swelling    Has patient had a PCN reaction causing immediate rash, facial/tongue/throat swelling, SOB or lightheadedness with hypotension: No Has patient had a PCN reaction causing severe rash involving mucus membranes  or skin necrosis: No Has patient had a PCN reaction that required hospitalization No Has patient had a PCN reaction occurring within the last 10 years: No If all of the above answers are "NO", then may proceed with Cephalosporin use.      Medications:   Prior to Admission medications   Medication Sig Start Date End Date Taking? Authorizing Provider  Baclofen 5 MG TABS Take 5 mg by mouth daily as needed (muscle spasms).   Yes [provider]  Biotin 5000 MCG TABS Take 5,000 mcg by mouth daily with breakfast. (0800)   Yes [provider]  calcium acetate (PHOSLO) 667 MG capsule Take 667-1,334 mg by mouth See admin instructions. Take 2 capsules (1334 mg) by mouth 3 times daily at 0800, 1200 & 1700.  Take 1 capsule (667 mg) by mouth twice daily at 1000 & 1400. 10/03/20  Yes [provider]  clopidogrel (PLAVIX) 75 MG tablet Take 1 tablet (75 mg total) by mouth daily with breakfast. Patient taking differently: Take 75 mg by mouth daily with breakfast. (0800) 07/14/20  Yes Mansouraty, Telford Nab., MD  cyanocobalamin (,VITAMIN B-12,) 1000 MCG/ML injection Inject 1,000 mcg into the muscle every 30 (thirty) days. On the 28th of every month   Yes [provider]  dolutegravir (TIVICAY) 50 MG tablet Take 50 mg by mouth daily at 4 PM.   Yes [provider]  Emollient (MINERIN) LOTN Apply 1  application topically every 12 (twelve) hours as needed (dry skin). Apply to feet and ankles   Yes [provider]  fentaNYL (DURAGESIC) 25 MCG/HR Place 1 patch onto the skin every 3 (three) days.   Yes [provider]  lamivudine (EPIVIR) 100 MG tablet Take 50 mg by mouth daily. (0800)   Yes [provider]  Lidocaine HCl 4 % CREA Apply 1 application topically every 12 (twelve) hours as needed (pain).   Yes [provider]  lidocaine-prilocaine (EMLA) cream Apply 1 application topically See admin instructions. (0900) Apply to fistula site one hour before dialysis on Tuesday, Thursday, and Saturday.   Yes [provider]  melatonin 3 MG TABS tablet Take 3 mg by mouth at bedtime. (2000)   Yes [provider]  methadone (DOLOPHINE) 5 MG tablet Take 2.5 mg by mouth daily at 8 pm. (2000) 08/02/19  Yes [provider]  mirtazapine (REMERON) 7.5 MG tablet Take 7.5 mg by mouth at bedtime. (2100)   Yes [provider]  Multiple Vitamin (MULTIVITAMIN WITH MINERALS) TABS tablet Take 1 tablet by mouth at bedtime. (2000)   Yes [provider]  ondansetron (ZOFRAN) 4 MG tablet Take 4 mg by mouth every 8 (eight) hours as needed for nausea or vomiting.   Yes [provider]  Oxycodone HCl 10 MG TABS Take 1 tablet (10 mg total) by mouth every 12 (twelve) hours as needed (severe pain). 07/22/19  Yes Tat, Shanon Brow, MD  pantoprazole (PROTONIX) 40 MG tablet Take 1 tablet (40 mg total) by mouth 2 (two) times daily before a meal. Patient taking differently: Take 40 mg by mouth daily. 08/05/20  Yes Erenest Rasher, PA-C  promethazine (PHENERGAN) 12.5 MG tablet Take 12.5 mg by mouth See admin instructions. Take 1 tablet (12.5 mg) by mouth in the morning every Tuesday, Thursday and Saturday for nausea/vomiting and every 6 hours as needed for n/v   Yes [provider]  senna (SENOKOT) 8.6 MG tablet Take 2 tablets by mouth every evening.  (2100)  Yes [provider]  venlafaxine XR (EFFEXOR-XR) 150 MG 24 hr capsule Take 150 mg by mouth daily with breakfast. (0900)   Yes [provider]  vitamin C (ASCORBIC ACID) 500 MG tablet Take 500 mg by mouth 2 (two) times daily. (0900 & 2100)   Yes [provider]  zidovudine (RETROVIR) 100 MG capsule Take 100 mg by mouth 3 (three) times daily. (0900, 1300, & 2100)   Yes [provider]  bacitracin 500 UNIT/GM ointment Apply 1 application topically every 8 (eight) hours as needed for wound care.    [provider]  nitroGLYCERIN (NITROSTAT) 0.4 MG SL tablet Place 0.4 mg under the tongue every 5 (five) minutes x 3 doses as needed for chest pain.     [provider]    Discontinued Meds:   Medications Discontinued During This Encounter  Medication Reason   venlafaxine (EFFEXOR) 25 MG tablet Patient Preference   loratadine (CLARITIN) 10 MG tablet Patient Preference   Calcium Acetate 062 MG TABS Duplicate   fentaNYL (SUBLIMAZE) injection Patient Discharge   iodixanol (VISIPAQUE) 320 MG/ML injection Patient Discharge   Heparin (Porcine) in NaCl 1000-0.9 UT/500ML-% SOLN Patient Discharge   lidocaine (PF) (XYLOCAINE) 1 % injection Patient Discharge   sodium chloride flush (NS) 0.9 % injection 3 mL Patient Transfer   sodium chloride flush (NS) 0.9 % injection 3 mL Patient Transfer   0.9 %  sodium chloride infusion Patient Transfer   promethazine (PHENERGAN) tablet 69.4 mg P&T Policy: Duplicate PRN Therapy   Lidocaine HCl 4 % CREA 1 application    Minerin LOTN 1 application    Biotin TABS 5,000 mcg    cyanocobalamin ((VITAMIN B-12)) injection 1,000 mcg    lidocaine-prilocaine (EMLA) cream 1 application    dextrose 50 % solution 12.5 g    hemostatic agents (no charge) Optime Patient Discharge   0.9 % irrigation (POUR BTL) Patient Discharge   heparin 6000 units / NS 500 mL irrigation Patient Discharge    Social History:  reports that  she quit smoking about 37 years ago. Her smoking use included cigarettes. She has never used smokeless tobacco. She reports that she does not drink alcohol and does not use drugs.  Family History:   Family History  Problem Relation Age of Onset   Anesthesia problems Neg Hx    Hypotension Neg Hx    Malignant hyperthermia Neg Hx    Pseudochol deficiency Neg Hx    Colon cancer Neg Hx        Unsure of parents who both died when she was a baby    Blood pressure (!) 188/97, pulse 78, temperature 98.6 F (37 C), temperature source Oral, resp. rate 20, height _0  (1.651 m), weight 47.7 kg, SpO2 94 %. Gen:nad, comfortable, sitting up in bed CVS:reg rate Resp:normal wob, bl chest expansion WNI:[id number] Ext:no edema ACCESS: rue avf +b/t (overlying skin changes)       Dwana Melena, MD 11/12/2020, 5:37 PM

## 2020-11-12 NOTE — Progress Notes (Signed)
No area noted to neck

## 2020-11-12 NOTE — Progress Notes (Signed)
Patient arrived back from pacu to 4e19. Vital signs obtained and patient placed on monitor. Patient with ace wrap to right arm. Some swelling to upper extremity consistent with PACU report. + pulse in right radial . Call bell with in reach. Tramon Crescenzo, Bettina Gavia RN

## 2020-11-12 NOTE — Anesthesia Postprocedure Evaluation (Signed)
Anesthesia Post Note  Patient: [name]  Procedure(s) Performed: CREATION OF RIGHT ARM ARTERIOVENOUS FISTULA (Right: Arm Upper) LIGATION OF ARTERIOVENOUS  FISTULA WITH EXCISION OF NECROTIC TISSUE (Right: Arm Upper)     Patient location during evaluation: PACU Anesthesia Type: General Level of consciousness: awake and alert Pain management: pain level controlled Vital Signs Assessment: post-procedure vital signs reviewed and stable Respiratory status: spontaneous breathing, nonlabored ventilation and respiratory function stable Cardiovascular status: blood pressure returned to baseline and stable Postop Assessment: no apparent nausea or vomiting Anesthetic complications: no   No notable events documented.  Last Vitals:  Vitals:   11/12/20 1823 11/12/20 1826  BP: (!) 174/96 (!) 175/108  Pulse: 67   Resp: 14 11  Temp: 36.4 C (!) 36.4 C  SpO2: 94% 98%    Last Pain:  Vitals:   11/12/20 1826  TempSrc: Oral  PainSc: 10-Worst pain ever                 Catalina Gravel

## 2020-11-12 NOTE — Progress Notes (Signed)
  Progress Note    11/12/2020 1:47 PM Day of Surgery  Subjective: Swelling much improved in her right upper arm  Vitals:   11/12/20 1148 11/12/20 1324  BP: (!) 132/99 (!) 188/97  Pulse: 74 78  Resp: 18 20  Temp:  98.6 F (37 C)  SpO2: 96% 94%    Physical Exam: Awake alert oriented Right upper arm much softer Ace bandage in place right forearm with improved swelling  CBC    Component Value Date/Time   WBC 2.8 (L) 11/11/2020 1839   RBC 2.94 (L) 11/11/2020 1839   HGB 9.3 (L) 11/11/2020 1839   HCT 31.2 (L) 11/11/2020 1839   PLT 144 (L) 11/11/2020 1839   MCV 106.1 (H) 11/11/2020 1839   MCH 31.6 11/11/2020 1839   MCHC 29.8 (L) 11/11/2020 1839   RDW 14.7 11/11/2020 1839   LYMPHSABS 0.6 (L) 10/18/2019 0941   MONOABS 0.3 10/18/2019 0941   EOSABS 0.1 10/18/2019 0941   BASOSABS 0.0 10/18/2019 0941    BMET    Component Value Date/Time   NA 141 11/11/2020 1017   K 4.2 11/11/2020 1017   CL 100 11/11/2020 1017   CO2 28 12/22/2019 1117   GLUCOSE 78 11/11/2020 1017   BUN 49 (H) 11/11/2020 1017   CREATININE 11.29 (H) 11/11/2020 1839   CREATININE 5.66 (H) 12/22/2019 1117   CALCIUM 8.9 12/22/2019 1117   CALCIUM 9.0 03/12/2011 0527   GFRNONAA 3 (L) 11/11/2020 1839   GFRNONAA 5 (L) 02/12/2015 1000   GFRAA 9 (L) 10/22/2019 0105   GFRAA 5 (L) 02/12/2015 1000    INR    Component Value Date/Time   INR 1.14 12/28/2011 1403     Intake/Output Summary (Last 24 hours) at 11/12/2020 1347 Last data filed at 11/11/2020 2050 Gross per 24 hour  Intake 110 ml  Output --  Net 110 ml     Assessment/plan:  70 y.o. female is status post drug-coated balloon angioplasty of right innominate vein stent.  Edema much improved.  We will plan to ligate and debride her right forearm fistula and convert to right upper arm fistula today in the OR.  If this is not possible she will need catheter which will possibly need to be femoral.  She demonstrates good understanding of the planned  procedure today.    Onix Jumper C. Donzetta Matters, MD [facility] [place] Office: [phone] Pager: [phone]  11/12/2020 1:47 PM

## 2020-11-12 NOTE — Anesthesia Preprocedure Evaluation (Addendum)
Anesthesia Evaluation  Patient identified by MRN, date of birth, ID band Patient awake    Reviewed: Allergy & Precautions, NPO status , Patient's Chart, lab work & pertinent test results  Airway Mallampati: I  TM Distance: >3 FB Neck ROM: Full    Dental  (+) Edentulous Upper, Edentulous Lower, Dental Advisory Given   Pulmonary former smoker,    breath sounds clear to auscultation       Cardiovascular hypertension, +CHF  + dysrhythmias Atrial Fibrillation  Rhythm:Regular Rate:Normal  Echo: - Left ventricle: The cavity size was normal. Wall thickness was  normal. Systolic function was normal. The estimated ejection  fraction was in the range of 60% to 65%.  - Mitral valve: There was mild regurgitation.  - Right atrium: The atrium was mildly dilated.    Neuro/Psych  Headaches, PSYCHIATRIC DISORDERS Anxiety Depression TIA   GI/Hepatic Neg liver ROS, PUD, GERD  ,  Endo/Other  negative endocrine ROS  Renal/GU ESRF and DialysisRenal disease     Musculoskeletal negative musculoskeletal ROS (+)   Abdominal Normal abdominal exam  (+)   Peds  Hematology  (+) HIV,   Anesthesia Other Findings   Reproductive/Obstetrics                           Anesthesia Physical Anesthesia Plan  ASA: 3  Anesthesia Plan: General   Post-op Pain Management:    Induction: Intravenous  PONV Risk Score and Plan: 4 or greater and Ondansetron, Midazolam and Treatment may vary due to age or medical condition  Airway Management Planned: Oral ETT  Additional Equipment: None  Intra-op Plan:   Post-operative Plan: Extubation in OR  Informed Consent: I have reviewed the patients History and Physical, chart, labs and discussed the procedure including the risks, benefits and alternatives for the proposed anesthesia with the patient or authorized representative who has indicated his/her understanding and acceptance.        Plan Discussed with: CRNA  Anesthesia Plan Comments:        Anesthesia Quick Evaluation

## 2020-11-12 NOTE — Progress Notes (Signed)
Blood glucose 60, Dr. Donzetta Matters made aware and orders received. See MAR for documentation. Rodnesha Elie, Bettina Gavia RN

## 2020-11-12 NOTE — Consult Note (Signed)
[facility] Nurse wound consult note Consultation was completed by review of records, images and assistance from the bedside nurse/clinical staff.   Reason for Consult:sacral wound Wound type: pressure injury Pressure Injury POA: Yes Measurement: 1.0 cmx 1.5cm x 0cm  Wound bed:100% yellow scab, not soft Drainage (amount, consistency, odor) none Periwound:intact  Dressing procedure/placement/frequency:  Continue silicone foam per nursing skin care order set. Turn and reposition every two hours and PRN. Manage moisture.  Re consult if needed, will not follow at this time. Thanks  Chris Cripps [employer], RN,CWOCN, CNS, [place] [phone])

## 2020-11-13 ENCOUNTER — Encounter (HOSPITAL_COMMUNITY): Payer: Self-pay | Admitting: Vascular Surgery

## 2020-11-13 DIAGNOSIS — K2100 Gastro-esophageal reflux disease with esophagitis, without bleeding: Secondary | ICD-10-CM | POA: Diagnosis present

## 2020-11-13 DIAGNOSIS — G9340 Encephalopathy, unspecified: Secondary | ICD-10-CM | POA: Diagnosis not present

## 2020-11-13 DIAGNOSIS — Z681 Body mass index (BMI) 19 or less, adult: Secondary | ICD-10-CM | POA: Diagnosis not present

## 2020-11-13 DIAGNOSIS — G928 Other toxic encephalopathy: Secondary | ICD-10-CM | POA: Diagnosis present

## 2020-11-13 DIAGNOSIS — G8929 Other chronic pain: Secondary | ICD-10-CM | POA: Diagnosis present

## 2020-11-13 DIAGNOSIS — K449 Diaphragmatic hernia without obstruction or gangrene: Secondary | ICD-10-CM | POA: Diagnosis present

## 2020-11-13 DIAGNOSIS — E1152 Type 2 diabetes mellitus with diabetic peripheral angiopathy with gangrene: Secondary | ICD-10-CM | POA: Diagnosis present

## 2020-11-13 DIAGNOSIS — Z7901 Long term (current) use of anticoagulants: Secondary | ICD-10-CM | POA: Diagnosis not present

## 2020-11-13 DIAGNOSIS — D696 Thrombocytopenia, unspecified: Secondary | ICD-10-CM | POA: Diagnosis present

## 2020-11-13 DIAGNOSIS — I4891 Unspecified atrial fibrillation: Secondary | ICD-10-CM | POA: Diagnosis present

## 2020-11-13 DIAGNOSIS — I721 Aneurysm of artery of upper extremity: Secondary | ICD-10-CM | POA: Diagnosis present

## 2020-11-13 DIAGNOSIS — I4892 Unspecified atrial flutter: Secondary | ICD-10-CM | POA: Diagnosis present

## 2020-11-13 DIAGNOSIS — E1122 Type 2 diabetes mellitus with diabetic chronic kidney disease: Secondary | ICD-10-CM | POA: Diagnosis present

## 2020-11-13 DIAGNOSIS — N186 End stage renal disease: Secondary | ICD-10-CM | POA: Diagnosis present

## 2020-11-13 DIAGNOSIS — T82510A Breakdown (mechanical) of surgically created arteriovenous fistula, initial encounter: Secondary | ICD-10-CM | POA: Diagnosis present

## 2020-11-13 DIAGNOSIS — R079 Chest pain, unspecified: Secondary | ICD-10-CM | POA: Diagnosis not present

## 2020-11-13 DIAGNOSIS — Z992 Dependence on renal dialysis: Secondary | ICD-10-CM | POA: Diagnosis not present

## 2020-11-13 DIAGNOSIS — I96 Gangrene, not elsewhere classified: Secondary | ICD-10-CM | POA: Diagnosis present

## 2020-11-13 DIAGNOSIS — R4182 Altered mental status, unspecified: Secondary | ICD-10-CM | POA: Diagnosis not present

## 2020-11-13 DIAGNOSIS — I739 Peripheral vascular disease, unspecified: Secondary | ICD-10-CM | POA: Diagnosis not present

## 2020-11-13 DIAGNOSIS — N2581 Secondary hyperparathyroidism of renal origin: Secondary | ICD-10-CM | POA: Diagnosis present

## 2020-11-13 DIAGNOSIS — D631 Anemia in chronic kidney disease: Secondary | ICD-10-CM | POA: Diagnosis present

## 2020-11-13 DIAGNOSIS — F32A Depression, unspecified: Secondary | ICD-10-CM | POA: Diagnosis present

## 2020-11-13 DIAGNOSIS — B20 Human immunodeficiency virus [HIV] disease: Secondary | ICD-10-CM | POA: Diagnosis present

## 2020-11-13 DIAGNOSIS — I120 Hypertensive chronic kidney disease with stage 5 chronic kidney disease or end stage renal disease: Secondary | ICD-10-CM | POA: Diagnosis present

## 2020-11-13 DIAGNOSIS — Z9889 Other specified postprocedural states: Secondary | ICD-10-CM

## 2020-11-13 DIAGNOSIS — I272 Pulmonary hypertension, unspecified: Secondary | ICD-10-CM | POA: Diagnosis present

## 2020-11-13 DIAGNOSIS — Y713 Surgical instruments, materials and cardiovascular devices (including sutures) associated with adverse incidents: Secondary | ICD-10-CM | POA: Diagnosis present

## 2020-11-13 DIAGNOSIS — Z20822 Contact with and (suspected) exposure to covid-19: Secondary | ICD-10-CM | POA: Diagnosis present

## 2020-11-13 DIAGNOSIS — L89159 Pressure ulcer of sacral region, unspecified stage: Secondary | ICD-10-CM | POA: Diagnosis present

## 2020-11-13 LAB — HEPATITIS B SURFACE ANTIBODY,QUALITATIVE: Hep B S Ab: REACTIVE — AB

## 2020-11-13 LAB — CBC
HCT: 25.9 % — ABNORMAL LOW (ref 36.0–46.0)
Hemoglobin: 7.6 g/dL — ABNORMAL LOW (ref 12.0–15.0)
MCH: 31.5 pg (ref 26.0–34.0)
MCHC: 29.3 g/dL — ABNORMAL LOW (ref 30.0–36.0)
MCV: 107.5 fL — ABNORMAL HIGH (ref 80.0–100.0)
Platelets: 132 10*3/uL — ABNORMAL LOW (ref 150–400)
RBC: 2.41 MIL/uL — ABNORMAL LOW (ref 3.87–5.11)
RDW: 14.9 % (ref 11.5–15.5)
WBC: 3.3 10*3/uL — ABNORMAL LOW (ref 4.0–10.5)
nRBC: 0 % (ref 0.0–0.2)

## 2020-11-13 LAB — BASIC METABOLIC PANEL
Anion gap: 12 (ref 5–15)
BUN: 59 mg/dL — ABNORMAL HIGH (ref 8–23)
CO2: 24 mmol/L (ref 22–32)
Calcium: 9 mg/dL (ref 8.9–10.3)
Chloride: 100 mmol/L (ref 98–111)
Creatinine, Ser: 13.2 mg/dL — ABNORMAL HIGH (ref 0.44–1.00)
GFR, Estimated: 3 mL/min — ABNORMAL LOW (ref >60)
Glucose, Bld: 109 mg/dL — ABNORMAL HIGH (ref 70–99)
Potassium: 6 mmol/L — ABNORMAL HIGH (ref 3.5–5.1)
Sodium: 136 mmol/L (ref 135–145)

## 2020-11-13 LAB — GLUCOSE, CAPILLARY
Glucose-Capillary: 86 mg/dL (ref 70–99)
Glucose-Capillary: 107 mg/dL — ABNORMAL HIGH (ref 70–99)
Glucose-Capillary: 94 mg/dL (ref 70–99)
Glucose-Capillary: 127 mg/dL — ABNORMAL HIGH (ref 70–99)

## 2020-11-13 LAB — HEPATITIS B SURFACE ANTIGEN: Hepatitis B Surface Ag: NONREACTIVE

## 2020-11-13 MED ORDER — CALCITRIOL 0.25 MCG PO CAPS
1.2500 ug | ORAL_CAPSULE | ORAL | Status: DC | PRN
  Administered 2020-11-16: 1.25 ug via ORAL
  Filled 2020-11-13 (×2): qty 5

## 2020-11-13 MED ORDER — CINACALCET HCL 30 MG PO TABS
60.0000 mg | ORAL_TABLET | ORAL | Status: DC
  Administered 2020-11-16 – 2020-11-26 (×2): 60 mg via ORAL
  Filled 2020-11-13 (×5): qty 2

## 2020-11-13 MED ORDER — DARBEPOETIN ALFA 100 MCG/0.5ML IJ SOSY
100.0000 ug | PREFILLED_SYRINGE | INTRAMUSCULAR | Status: DC
  Administered 2020-11-14 – 2020-11-21 (×2): 100 ug via INTRAVENOUS
  Filled 2020-11-13 (×4): qty 0.5

## 2020-11-13 NOTE — Progress Notes (Signed)
  Progress Note    11/13/2020 2:07 PM 1 Day Post-Op  Subjective:  says she has pain in the right arm  Afebrile  Vitals:   11/13/20 1030 11/13/20 1230  BP: (!) 165/64 (!) 131/57  Pulse: 90 80  Resp: (!) 23 18  Temp:  (!) 97.5 F (36.4 C)  SpO2:      Physical Exam: General  no distress Lungs:  non labored Incisions:       CBC    Component Value Date/Time   WBC 3.3 (L) 11/13/2020 0257   RBC 2.41 (L) 11/13/2020 0257   HGB 7.6 (L) 11/13/2020 0257   HCT 25.9 (L) 11/13/2020 0257   PLT 132 (L) 11/13/2020 0257   MCV 107.5 (H) 11/13/2020 0257   MCH 31.5 11/13/2020 0257   MCHC 29.3 (L) 11/13/2020 0257   RDW 14.9 11/13/2020 0257   LYMPHSABS 0.6 (L) 10/18/2019 0941   MONOABS 0.3 10/18/2019 0941   EOSABS 0.1 10/18/2019 0941   BASOSABS 0.0 10/18/2019 0941    BMET    Component Value Date/Time   NA 136 11/13/2020 0257   K 6.0 (H) 11/13/2020 0257   CL 100 11/13/2020 0257   CO2 24 11/13/2020 0257   GLUCOSE 109 (H) 11/13/2020 0257   BUN 59 (H) 11/13/2020 0257   CREATININE 13.20 (H) 11/13/2020 0257   CREATININE 5.66 (H) 12/22/2019 1117   CALCIUM 9.0 11/13/2020 0257   CALCIUM 9.0 03/12/2011 0527   GFRNONAA 3 (L) 11/13/2020 0257   GFRNONAA 5 (L) 02/12/2015 1000   GFRAA 9 (L) 10/22/2019 0105   GFRAA 5 (L) 02/12/2015 1000    INR    Component Value Date/Time   INR 1.14 12/28/2011 1403     Intake/Output Summary (Last 24 hours) at 11/13/2020 1407 Last data filed at 11/13/2020 1230 Gross per 24 hour  Intake 1260 ml  Output 1500 ml  Net -240 ml     Assessment/Plan:  70 y.o. 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    1 Day Post-Op   -pt dressing on right arm removed.  See picture above. Pt tolerated dressing change with some pain.  Will need bid dressing changes starting tomorrow.  -she resides at SNF-will go ahead and ask TOC to see pt for when she is ready to return.   -pt  to receive HD here tomorrow.     Leontine Locket, PA-C [facility] [phone] 11/13/2020 2:07 PM

## 2020-11-13 NOTE — Progress Notes (Signed)
[facility] Progress Note    Assessment/ Plan:   ESRD:  -outpatient orders: [name], TTS, Gambro 300, 350/500, 2k, 2.5cal, 15g needles, edw 46.5kg, receives hep 1k units and 500 units/hr -HD today, plan for HD tomorrow (getting her back on sched) - Dose all meds for creatinine clearance < 10 ml/min  - Unless absolutely necessary, no MRIs with gadolinium.   Volume/ hypertension: EDW 46.5kg. UF as tolerated Anemia of Chronic Kidney Disease: Hemoglobin 9.3  on presentation; down to 7.6 as of today (blood loss from surgery?), receives epogen 3600units TIW. Aranesp 176mg starting 9/15  Secondary Hyperparathyroidism/Hyperphosphatemia: Continue with binders; receives cinacalcet 617mand calcitriol 1.2590mTIW  Vascular access: Right upper extremity fistula with good bruit, slightly pulsatile s/p rt cimino resection + ligation prox/distal + collaterals.  Subjective:   Seen on hd, no complaints, felt sick earlier. Tolerating HD.   Objective:   BP 136/69 (BP Location: Left Leg)   Pulse 68   Temp 97.8 F (36.6 C) (Oral)   Resp 20   Ht _0  (1.651 m)   Wt 47.7 kg   LMP  (LMP Unknown)   SpO2 98%   BMI 17.50 kg/m   Intake/Output Summary (Last 24 hours) at 11/13/2020 0920998st data filed at 11/13/2020 0656 Gross per 24 hour  Intake 1260 ml  Output 700 ml  Net 560 ml   Weight change: 1.7 kg  Physical Exam: Gen:nad CVS:rrr Resp:normal wob AbdPJA:SNKNt Ext:no edema Neuro: awake, alert HD access: RUE AVF  Imaging: PERIPHERAL VASCULAR CATHETERIZATION  Result Date: 11/11/2020 Images from the original result were not included. Patient name: [name]: [id number]: 12/Feb 25, 1952x: female 11/11/2020 Pre-operative Diagnosis: End-stage renal disease Ulceration of right arm AV fistula Post-operative diagnosis:  Same Surgeon:  BraEda PaschalaiDonzetta MattersD Procedure Performed: 1.  Ultrasound-guided cannulation right arm AV fistula 2.  Right upper extremity fistulogram  3.  Drug-coated balloon angioplasty right subclavian and right innominate veins with 10 x 60 mm Lutonix Indications: [age]male with end-stage renal disease.  She now has significant edema of her right upper extremity with a known right innominate stent in place.  She also has ulceration of the pseudoaneurysm of her right forearm which is concerning for rupture she is indicated for fistulogram with possible intervention. Findings: Fistula is large throughout her upper arm there is significant edema throughout her upper arm.  The innominate stent was subtotally occluded.  After regular and drug-coated balloon angioplasty we have flow through the stent and decreased reflux up her right IJ although there is still some reflux this appears patent. We will plan for debridement of right forearm fistula likely conversion to upper arm fistula on the right.  Procedure:  The patient was identified in the holding area and taken to room 8.  The patient was then placed supine on the table and prepped and draped in the usual sterile fashion.  A time out was called.  Ultrasound was used to evaluate the right arm AV fistula.  The area was anesthetized 1% lidocaine cannulated with direct ultrasound visualization with a micropuncture needle followed by wire and sheath.  An image saved per record.  5 extremity fistulogram was performed.  With the above findings we elected to intervene.  We placed a short 7 FrePakistaneath.  We used a Glidewire and bear catheter to traverse into the SVC and confirmed intraluminal access with central venogram.  We then placed a Bentson wire.  We primarily dilated the stent  with a 10 mm balloon.  We then performed drug-coated balloon angioplasty at 10 atm for 3 minutes.  Completion demonstrated brisk flow through the stent with significantly reduced reflux in the right internal jugular vein.  Satisfied with this we removed our wire.  We sutured the cannulation site with 4 Monocryl.  She tolerated procedure  without any complication. Contrast: 60 cc Brandon C. Donzetta Matters, MD [facility] [place] Office: [phone] Pager: [phone]    Labs: BMET Recent Labs  Lab 11/11/20 1017 11/11/20 1839 11/13/20 0257  NA 141  --  136  K 4.2  --  6.0*  CL 100  --  100  CO2  --   --  24  GLUCOSE 78  --  109*  BUN 49*  --  59*  CREATININE 11.40* 11.29* 13.20*  CALCIUM  --   --  9.0   CBC Recent Labs  Lab 11/11/20 1017 11/11/20 1839 11/13/20 0257  WBC  --  2.8* 3.3*  HGB 11.6* 9.3* 7.6*  HCT 34.0* 31.2* 25.9*  MCV  --  106.1* 107.5*  PLT  --  144* 132*    Medications:     vitamin C  500 mg Oral Q12H   calcium acetate  1,334 mg Oral TID WC   Chlorhexidine Gluconate Cloth  6 each Topical Q0600   clopidogrel  75 mg Oral Q breakfast   dolutegravir  50 mg Oral q1600   heparin  5,000 Units Subcutaneous Q8H   lamiVUDine  50 mg Oral QAC breakfast   melatonin  3 mg Oral Q24H   methadone  2.5 mg Oral Q2000   mirtazapine  7.5 mg Oral QHS   multivitamin with minerals  1 tablet Oral Q24H   mupirocin ointment  1 application Nasal BID   pantoprazole  40 mg Oral BID AC   senna  2 tablet Oral Q24H   sodium chloride flush  3 mL Intravenous Q12H   venlafaxine XR  150 mg Oral Q breakfast   zidovudine  100 mg Oral 3 times per day      Gean Quint, MD [facility] 11/13/2020, 9:22 AM

## 2020-11-13 NOTE — Progress Notes (Signed)
  Progress Note    11/13/2020 1:26 PM 1 Day Post-Op  Subjective: Complains of right arm pain  Vitals:   11/13/20 1030 11/13/20 1230  BP: (!) 165/64 (!) 131/57  Pulse: 90 80  Resp: (!) 23 18  Temp:  (!) 97.5 F (36.4 C)  SpO2:      Physical Exam: Evaluated on dialysis Right arm dressing clean dry intact Right upper extremity with some pulsatility fistula is easily palpable  CBC    Component Value Date/Time   WBC 3.3 (L) 11/13/2020 0257   RBC 2.41 (L) 11/13/2020 0257   HGB 7.6 (L) 11/13/2020 0257   HCT 25.9 (L) 11/13/2020 0257   PLT 132 (L) 11/13/2020 0257   MCV 107.5 (H) 11/13/2020 0257   MCH 31.5 11/13/2020 0257   MCHC 29.3 (L) 11/13/2020 0257   RDW 14.9 11/13/2020 0257   LYMPHSABS 0.6 (L) 10/18/2019 0941   MONOABS 0.3 10/18/2019 0941   EOSABS 0.1 10/18/2019 0941   BASOSABS 0.0 10/18/2019 0941    BMET    Component Value Date/Time   NA 136 11/13/2020 0257   K 6.0 (H) 11/13/2020 0257   CL 100 11/13/2020 0257   CO2 24 11/13/2020 0257   GLUCOSE 109 (H) 11/13/2020 0257   BUN 59 (H) 11/13/2020 0257   CREATININE 13.20 (H) 11/13/2020 0257   CREATININE 5.66 (H) 12/22/2019 1117   CALCIUM 9.0 11/13/2020 0257   CALCIUM 9.0 03/12/2011 0527   GFRNONAA 3 (L) 11/13/2020 0257   GFRNONAA 5 (L) 02/12/2015 1000   GFRAA 9 (L) 10/22/2019 0105   GFRAA 5 (L) 02/12/2015 1000    INR    Component Value Date/Time   INR 1.14 12/28/2011 1403     Intake/Output Summary (Last 24 hours) at 11/13/2020 1326 Last data filed at 11/13/2020 1230 Gross per 24 hour  Intake 1260 ml  Output 1500 ml  Net -240 ml     Assessment:  70 y.o. 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  Wet-to-dry dressing to be changed later today.  Okay to dialyze through upper arm portion of fistula.   Karmina Zufall C. Donzetta Matters, MD [facility] [place] Office: [phone] Pager:  [phone]  11/13/2020 1:26 PM

## 2020-11-14 LAB — GLUCOSE, CAPILLARY
Glucose-Capillary: 76 mg/dL (ref 70–99)
Glucose-Capillary: 88 mg/dL (ref 70–99)
Glucose-Capillary: 89 mg/dL (ref 70–99)
Glucose-Capillary: 88 mg/dL (ref 70–99)
Glucose-Capillary: 131 mg/dL — ABNORMAL HIGH (ref 70–99)

## 2020-11-14 LAB — RENAL FUNCTION PANEL
Albumin: 2.9 g/dL — ABNORMAL LOW (ref 3.5–5.0)
Anion gap: 10 (ref 5–15)
BUN: 24 mg/dL — ABNORMAL HIGH (ref 8–23)
CO2: 29 mmol/L (ref 22–32)
Calcium: 9.5 mg/dL (ref 8.9–10.3)
Chloride: 95 mmol/L — ABNORMAL LOW (ref 98–111)
Creatinine, Ser: 6.87 mg/dL — ABNORMAL HIGH (ref 0.44–1.00)
GFR, Estimated: 6 mL/min — ABNORMAL LOW (ref >60)
Glucose, Bld: 81 mg/dL (ref 70–99)
Phosphorus: 3.7 mg/dL (ref 2.5–4.6)
Potassium: 4.4 mmol/L (ref 3.5–5.1)
Sodium: 134 mmol/L — ABNORMAL LOW (ref 135–145)

## 2020-11-14 LAB — CBC
HCT: 25.2 % — ABNORMAL LOW (ref 36.0–46.0)
Hemoglobin: 7.4 g/dL — ABNORMAL LOW (ref 12.0–15.0)
MCH: 31.5 pg (ref 26.0–34.0)
MCHC: 29.4 g/dL — ABNORMAL LOW (ref 30.0–36.0)
MCV: 107.2 fL — ABNORMAL HIGH (ref 80.0–100.0)
Platelets: 161 10*3/uL (ref 150–400)
RBC: 2.35 MIL/uL — ABNORMAL LOW (ref 3.87–5.11)
RDW: 15.1 % (ref 11.5–15.5)
WBC: 3.9 10*3/uL — ABNORMAL LOW (ref 4.0–10.5)
nRBC: 0 % (ref 0.0–0.2)

## 2020-11-14 LAB — HEPATITIS B SURFACE ANTIBODY, QUANTITATIVE: Hep B S AB Quant (Post): >1000 m[IU]/mL (ref >9.9)

## 2020-11-14 NOTE — Plan of Care (Signed)
  Problem: Education: Goal: Knowledge of General Education information will improve Description Including pain rating scale, medication(s)/side effects and non-pharmacologic comfort measures Outcome: Progressing   

## 2020-11-14 NOTE — Progress Notes (Signed)
  [facility] Progress Note    Assessment/ Plan:   ESRD:  -outpatient orders: [name], TTS, Gambro 300, 350/500, 2k, 2.5cal, 15g needles, edw 46.5kg, receives hep 1k units and 500 units/hr -HD today, will maintain TTS schedule - Dose all meds for creatinine clearance < 10 ml/min  - Unless absolutely necessary, no MRIs with gadolinium.   Volume/ hypertension: EDW 46.5kg. UF as tolerated Anemia of Chronic Kidney Disease: Hemoglobin 9.3  on presentation; down to 7.6 as of 9/15 (blood loss from surgery?), receives epogen 3600units TIW. Aranesp 178mcg starting 9/15. Transfuse prn  Secondary Hyperparathyroidism/Hyperphosphatemia: Continue with binders; receives cinacalcet 60mg  and calcitriol 1.60mcg TIW  Vascular access: Right upper extremity fistula with good bruit, slightly pulsatile s/p rt cimino resection + ligation prox/distal + collaterals.  Subjective:   No acute events, tolerated hd yesterday, net uf 800cc. Still reports pain in her arm. Due for her scheduled HD today   Objective:   BP (!) 152/72 (BP Location: Right Leg)   Pulse 83   Temp 98.5 F (36.9 C) (Oral)   Resp 13   Ht 5\' 5"  (1.651 m)   Wt 51 kg   LMP  (LMP Unknown)   SpO2 100%   BMI 18.71 kg/m   Intake/Output Summary (Last 24 hours) at 11/14/2020 5284 Last data filed at 11/13/2020 1230 Gross per 24 hour  Intake --  Output 800 ml  Net -800 ml   Weight change: 4.2 kg  Physical Exam: Gen:nad CVS:rrr Resp:normal wob XLK:GMWN, nt Ext:no edema Neuro: awake, alert HD access: RUE AVF  Imaging: No results found.  Labs: BMET Recent Labs  Lab 11/11/20 1017 11/11/20 1839 11/13/20 0257  NA 141  --  136  K 4.2  --  6.0*  CL 100  --  100  CO2  --   --  24  GLUCOSE 78  --  109*  BUN 49*  --  59*  CREATININE 11.40* 11.29* 13.20*  CALCIUM  --   --  9.0   CBC Recent Labs  Lab 11/11/20 1017 11/11/20 1839 11/13/20 0257  WBC  --  2.8* 3.3*  HGB 11.6* 9.3* 7.6*  HCT 34.0* 31.2* 25.9*   MCV  --  106.1* 107.5*  PLT  --  144* 132*    Medications:     vitamin C  500 mg Oral Q12H   calcium acetate  1,334 mg Oral TID WC   Chlorhexidine Gluconate Cloth  6 each Topical Q0600   cinacalcet  60 mg Oral Q T,Th,Sa-HD   clopidogrel  75 mg Oral Q breakfast   darbepoetin (ARANESP) injection - DIALYSIS  100 mcg Intravenous Q Thu-HD   dolutegravir  50 mg Oral q1600   heparin  5,000 Units Subcutaneous Q8H   lamiVUDine  50 mg Oral QAC breakfast   melatonin  3 mg Oral Q24H   methadone  2.5 mg Oral Q2000   mirtazapine  7.5 mg Oral QHS   multivitamin with minerals  1 tablet Oral Q24H   mupirocin ointment  1 application Nasal BID   pantoprazole  40 mg Oral BID AC   senna  2 tablet Oral Q24H   sodium chloride flush  3 mL Intravenous Q12H   venlafaxine XR  150 mg Oral Q breakfast   zidovudine  100 mg Oral 3 times per day      Gean Quint, MD [facility] 11/14/2020, 8:21 AM

## 2020-11-14 NOTE — Progress Notes (Addendum)
[facility] [place]  Subjective  - painful right arm with manipulation   Objective (!) 152/72 83 98.5 F (36.9 C) (Oral) 13 100%  Intake/Output Summary (Last 24 hours) at 11/14/2020 0800 Last data filed at 11/13/2020 1230 Gross per 24 hour  Intake --  Output 800 ml  Net -800 ml    Dressing changed at bedside no purulence in wound bed.  Wet to dry packing placed. Right hand warm and well perfused Lungs non labored breathing   Assessment/Planning: POD # 2 70 y.o. 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  Wet-to-dry dressing BID Patient tolerated HD using UE fistula   Pending discharge back to SNF TOC order placed late yesterday.   Roxy Horseman 11/14/2020 8:00 AM --  Laboratory Lab Results: Recent Labs    11/11/20 1839 11/13/20 0257  WBC 2.8* 3.3*  HGB 9.3* 7.6*  HCT 31.2* 25.9*  PLT 144* 132*   BMET Recent Labs    11/11/20 1017 11/11/20 1839 11/13/20 0257  NA 141  --  136  K 4.2  --  6.0*  CL 100  --  100  CO2  --   --  24  GLUCOSE 78  --  109*  BUN 49*  --  59*  CREATININE 11.40* 11.29* 13.20*  CALCIUM  --   --  9.0    COAG Lab Results  Component Value Date   INR 1.14 12/28/2011   INR 1.10 12/13/2010   INR 1.10 01/26/2010   No results found for: PTT   I have independently interviewed and examined patient and agree with PA assessment and plan above.   Jian Hodgman C. Donzetta Matters, MD [facility] [place] Office: [phone] Pager: [phone]

## 2020-11-15 LAB — GLUCOSE, CAPILLARY
Glucose-Capillary: 79 mg/dL (ref 70–99)
Glucose-Capillary: 92 mg/dL (ref 70–99)
Glucose-Capillary: 74 mg/dL (ref 70–99)
Glucose-Capillary: 97 mg/dL (ref 70–99)

## 2020-11-15 LAB — SARS CORONAVIRUS 2 (TAT 6-24 HRS): SARS Coronavirus 2: NEGATIVE

## 2020-11-15 NOTE — Progress Notes (Signed)
[facility] Progress Note    Assessment/ Plan:   ESRD:  -outpatient orders: [name], TTS, Gambro 300, 350/500, 2k, 2.5cal, 15g needles, edw 46.5kg, receives hep 1k units and 500 units/hr -will maintain TTS schedule - Dose all meds for creatinine clearance < 10 ml/min  - Unless absolutely necessary, no MRIs with gadolinium.   Volume/ hypertension: EDW 46.5kg. UF as tolerated Anemia of Chronic Kidney Disease: Hemoglobin 9.3  on presentation; down to 7.6 as of 9/15 (blood loss from surgery?), receives epogen 3600units TIW. Aranesp 166mcg starting 9/15. Transfuse prn  Secondary Hyperparathyroidism/Hyperphosphatemia: Continue with binders; receives cinacalcet 60mg  and calcitriol 1.45mcg TIW  Vascular access: Right upper extremity fistula with good bruit, slightly pulsatile s/p rt cimino resection + ligation prox/distal + collaterals. Dispo: pending d/c back to SNF, awaiting PT/OT reccs  Subjective:   No acute events, tolerated hd yesterday, net uf 1.4L. in a lot of pain during my encounter (currently having wound care done)   Objective:   BP (!) 109/53 (BP Location: Right Leg)   Pulse 78   Temp 98.4 F (36.9 C) (Oral)   Resp 14   Ht 5\' 5"  (1.651 m)   Wt 46.6 kg   LMP  (LMP Unknown)   SpO2 99%   BMI 17.10 kg/m   Intake/Output Summary (Last 24 hours) at 11/15/2020 1147 Last data filed at 11/14/2020 1500 Gross per 24 hour  Intake 240 ml  Output --  Net 240 ml   Weight change: -3.5 kg  Physical Exam: Gen:nad CVS:rrr Resp:normal wob NOM:VEHM, nt Ext:no edema Neuro: awake, alert HD access: RUE AVF  Imaging: No results found.  Labs: BMET Recent Labs  Lab 11/11/20 1017 11/11/20 1839 11/13/20 0257 11/14/20 1000  NA 141  --  136 134*  K 4.2  --  6.0* 4.4  CL 100  --  100 95*  CO2  --   --  24 29  GLUCOSE 78  --  109* 81  BUN 49*  --  59* 24*  CREATININE 11.40* 11.29* 13.20* 6.87*  CALCIUM  --   --  9.0 9.5  PHOS  --   --   --  3.7    CBC Recent Labs  Lab 11/11/20 1017 11/11/20 1839 11/13/20 0257 11/14/20 0921  WBC  --  2.8* 3.3* 3.9*  HGB 11.6* 9.3* 7.6* 7.4*  HCT 34.0* 31.2* 25.9* 25.2*  MCV  --  106.1* 107.5* 107.2*  PLT  --  144* 132* 161    Medications:     vitamin C  500 mg Oral Q12H   calcium acetate  1,334 mg Oral TID WC   Chlorhexidine Gluconate Cloth  6 each Topical Q0600   cinacalcet  60 mg Oral Q T,Th,Sa-HD   clopidogrel  75 mg Oral Q breakfast   darbepoetin (ARANESP) injection - DIALYSIS  100 mcg Intravenous Q Thu-HD   dolutegravir  50 mg Oral q1600   heparin  5,000 Units Subcutaneous Q8H   lamiVUDine  50 mg Oral QAC breakfast   melatonin  3 mg Oral Q24H   methadone  2.5 mg Oral Q2000   mirtazapine  7.5 mg Oral QHS   multivitamin with minerals  1 tablet Oral Q24H   mupirocin ointment  1 application Nasal BID   pantoprazole  40 mg Oral BID AC   senna  2 tablet Oral Q24H   sodium chloride flush  3 mL Intravenous Q12H   venlafaxine XR  150 mg Oral Q breakfast   zidovudine  100 mg Oral 3 times per day      Gean Quint, MD [facility] 11/15/2020, 11:47 AM

## 2020-11-15 NOTE — Consult Note (Addendum)
[place] Nurse Consult Note: Patient receiving care in [place].  Reason for Consult: NPWT to RFA Wound type: surgical Pressure Injury POA: Yes/No/NA Measurement: 3.8 cm x 2.5 xm z 1 cm with 2 cm tunnel at 12 o'clock. I tucked as much of the one pieces of foam into the tunnel as the patient could tolerate. Wound bed: dry, pink, some brown Drainage (amount, consistency, odor) to be determined Periwound: staples along suture line above and below the open wound. These were protected with small pieces of Mepitel before placing the drape over the foam. Dressing procedure/placement/frequency: MWF VAC change by [place] nurse. Primary RN notified patient requesting pain med upon completion of dressing application.  Small foam ordered for Monday.  Val Riles, RN, MSN, CWOCN, CNS-BC, pager [phone]

## 2020-11-15 NOTE — Progress Notes (Addendum)
[facility] [place]  Subjective  - Comfortable   Objective (!) 119/57 75 98.6 F (37 C) (Oral) 12 99%  Intake/Output Summary (Last 24 hours) at 11/15/2020 0732 Last data filed at 11/14/2020 1500 Gross per 24 hour  Intake 240 ml  Output 1410 ml  Net -1170 ml    Right arm motor and sensation intact Palpable thrill/pulsatile in upper arm fistula Lungs non labored breathing   Assessment/Planning: POD # 2 70 y.o. 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  Wet-to-dry dressing BID Patient tolerated HD using UE fistula   Plan to consult wound care RN to place black sponge vac today  TOC pending return to SNF  Roxy Horseman 11/15/2020 7:32 AM --  Laboratory Lab Results: Recent Labs    11/13/20 0257 11/14/20 0921  WBC 3.3* 3.9*  HGB 7.6* 7.4*  HCT 25.9* 25.2*  PLT 132* 161   BMET Recent Labs    11/13/20 0257 11/14/20 1000  NA 136 134*  K 6.0* 4.4  CL 100 95*  CO2 24 29  GLUCOSE 109* 81  BUN 59* 24*  CREATININE 13.20* 6.87*  CALCIUM 9.0 9.5    COAG Lab Results  Component Value Date   INR 1.14 12/28/2011   INR 1.10 12/13/2010   INR 1.10 01/26/2010   No results found for: PTT  I have independently interviewed and examined patient and agree with PA assessment and plan above.  Appreciate wound care assistance with wound VAC.  Pending return to SNF.  Jermisha Hoffart C. Donzetta Matters, MD [facility] [place] Office: [phone] Pager: [phone]

## 2020-11-15 NOTE — TOC Initial Note (Addendum)
Transition of Care [facility]) - Initial/Assessment Note    Patient Details  Name: [name] MRN: [id number] Date of Birth: 04-12-1950  [facility]) CM/SW Contact:    Vinie Sill, LCSW Phone Number: 11/15/2020, 11:36 AM  Clinical Narrative:                  CSW met with patient at bedside. CSW introduced self and explained role. Patient confirmed she arrived here from [facility] Patient states she is agreeable to returning to SNF.   CSW spoke with [facility]- she confirmed they can admit over the weekend. She confirmed they have wound vac for patient. Melissa requested patient be seen by PT/OT but CSW does not have to get authorization. CSW advised SNF, someone needs to come and pick up patient's wheelchair.  CSW informd RN- patient needs to seen by PT/OT before returning back to SNF -   TOC will continue to follow and assist with discharge planning.  Thurmond Butts, MSW, LCSW Clinical Social Worker     Expected Discharge Plan: Skilled Nursing Facility Barriers to Discharge: Insurance Authorization   Patient Goals and CMS Choice        Expected Discharge Plan and Services Expected Discharge Plan: [place] In-house Referral: Clinical Social Work     Living arrangements for the past 2 months: [place]                                      Prior Living Arrangements/Services Living arrangements for the past 2 months: [place] Lives with:: Facility Resident Patient language and need for interpreter reviewed:: No        Need for Family Participation in Patient Care: Yes (Comment) Care giver support system in place?: Yes (comment)   Criminal Activity/Legal Involvement Pertinent to Current Situation/Hospitalization: No - Comment as needed  Activities of Daily Living      Permission Sought/Granted Permission sought to share information with : Family Supports Permission  granted to share information with : Yes, Verbal Permission Granted  Share Information with NAME: Marut,[name]  Permission granted to share info w AGENCY: [place] granted to share info w Relationship: daughter  Permission granted to share info w Contact Information: [phone]  Emotional Assessment Appearance:: Appears stated age Attitude/Demeanor/Rapport: Engaged Affect (typically observed): Pleasant, Appropriate Orientation: : Oriented to Self, Oriented to Place, Oriented to  Time, Oriented to Situation, Fluctuating Orientation (Suspected and/or reported Sundowners) Alcohol / Substance Use: Not Applicable Psych Involvement: No (comment)  Admission diagnosis:  ESRD (end stage renal disease) ([place]) [N18.6] Patient Active Problem List   Diagnosis Date Noted   ESRD (end stage renal disease) ([place]) 11/11/2020   Pancreatic abnormality 08/05/2020   Gastritis without bleeding 08/05/2020   Dilated pancreatic duct 05/15/2020   Abnormal MRI of abdomen 05/15/2020   Common bile duct dilation    Lobar pneumonia ([place]) 07/20/2019   Acute respiratory failure with hypoxia ([place]) 07/20/2019   Colitis 07/19/2019   Dilation of common bile duct 07/19/2019   Ventral hernia without obstruction or gangrene    Hypocalcemia 03/07/2019   Other pancytopenia ([place]) 12/08/2018   Anemia of chronic disease 04/17/2016   Hepatitis B immune 01/28/2016   Atrial flutter ([place]) 02/22/2015   Hyperkalemia 02/20/2015   Venous stenosis of right upper extremity 04/06/2014   ESRD on dialysis ([place]) 04/06/2014   Essential hypertension  Non-intractable vomiting 12/15/2012   CHF, chronic (HCC) 11/25/2012   Atrial fibrillation ([place]) 05/02/2012   Intracranial bleed ([place]) 12/28/2011   Angioedema of lips 09/21/2011   Cellulitis of breast 03/09/2011   Erosive esophagitis 12/15/2010   Mallory - Weiss tear 12/15/2010   UGI bleed 12/13/2010   DIARRHEA 04/12/2009   Human immunodeficiency virus (HIV) disease  ([place]) 11/15/2008   PANCREATITIS, HX OF 11/15/2008   TOBACCO USE, QUIT 11/15/2008   PAIN IN JOINT, UPPER ARM 08/07/2008   ABDOMINAL WALL HERNIA 04/26/2007   BRONCHITIS, ACUTE 11/16/2006   HYPOKALEMIA, HX OF 07/13/2006   DEPRESSION 03/23/2006   Essential hypertension, malignant 03/23/2006   GERD 03/23/2006   PANCREATITIS 03/23/2006   MASS, RIGHT AXILLA 03/23/2006   HEADACHE 03/23/2006   SYMPTOM, ENLARGEMENT, LYMPH NODES 03/23/2006   SHINGLES, HX OF 03/23/2006   HYSTERECTOMY, TOTAL, HX OF 03/23/2006   PCP:  Hilbert Corrigan, MD Pharmacy:   [place], [place] - [address] [place] [address] Phone: [phone] Fax: [phone]     Social Determinants of Health (SDOH) Interventions    Readmission Risk Interventions Readmission Risk Prevention Plan 10/22/2019  Transportation Screening Complete  PCP or Specialist Appt within 3-5 Days Not Complete  Not Complete comments SNF MD to follow  [place] or [place] Not Complete  HRI or Home Care Consult comments Pt resides in a SNF  Social Work Consult for [place] Planning/Counseling Complete  Palliative Care Screening Not Applicable  Medication Review Press photographer) Complete  Some recent data might be hidden

## 2020-11-16 LAB — GLUCOSE, CAPILLARY: Glucose-Capillary: 84 mg/dL (ref 70–99)

## 2020-11-16 NOTE — Progress Notes (Signed)
  [facility] Progress Note    Assessment/ Plan:   ESRD:  -outpatient orders: [name], TTS, Gambro 300, 350/500, 2k, 2.5cal, 15g needles, edw 46.5kg, receives hep 1k units and 500 units/hr -will maintain TTS schedule - Dose all meds for creatinine clearance < 10 ml/min  - Unless absolutely necessary, no MRIs with gadolinium.   Volume/ hypertension: EDW 46.5kg. UF as tolerated Anemia of Chronic Kidney Disease: Hemoglobin 9.3  on presentation; down to 7.6 as of 9/15 (blood loss from surgery?), receives epogen 3600units TIW. Aranesp 156mcg starting 9/15. Transfuse prn  Secondary Hyperparathyroidism/Hyperphosphatemia: Continue with binders; receives cinacalcet 60mg  and calcitriol 1.63mcg TIW  Vascular access: Right upper extremity fistula with good bruit, slightly pulsatile s/p rt cimino resection + ligation prox/distal + collaterals.. Have been able to cannulate Dispo: pending d/c back to SNF, awaiting PT/OT reccs  Subjective:   No acute events, still in pain from her surgery.   Objective:   BP (!) 145/76   Pulse 79   Temp 98.4 F (36.9 C) (Oral)   Resp (!) 22   Ht 5\' 5"  (1.651 m)   Wt 44.8 kg   LMP  (LMP Unknown)   SpO2 99%   BMI 16.44 kg/m  No intake or output data in the 24 hours ending 11/16/20 1109  Weight change: -1.8 kg  Physical Exam: Gen:nad CVS:rrr Resp:normal wob RDE:YCXK, nt Ext:no edema Neuro: awake, alert HD access: RUE AVF, woundvac+dressing in place  Imaging: No results found.  Labs: BMET Recent Labs  Lab 11/11/20 1017 11/11/20 1839 11/13/20 0257 11/14/20 1000  NA 141  --  136 134*  K 4.2  --  6.0* 4.4  CL 100  --  100 95*  CO2  --   --  24 29  GLUCOSE 78  --  109* 81  BUN 49*  --  59* 24*  CREATININE 11.40* 11.29* 13.20* 6.87*  CALCIUM  --   --  9.0 9.5  PHOS  --   --   --  3.7   CBC Recent Labs  Lab 11/11/20 1017 11/11/20 1839 11/13/20 0257 11/14/20 0921  WBC  --  2.8* 3.3* 3.9*  HGB 11.6* 9.3* 7.6* 7.4*  HCT  34.0* 31.2* 25.9* 25.2*  MCV  --  106.1* 107.5* 107.2*  PLT  --  144* 132* 161    Medications:     vitamin C  500 mg Oral Q12H   calcium acetate  1,334 mg Oral TID WC   cinacalcet  60 mg Oral Q T,Th,Sa-HD   clopidogrel  75 mg Oral Q breakfast   darbepoetin (ARANESP) injection - DIALYSIS  100 mcg Intravenous Q Thu-HD   dolutegravir  50 mg Oral q1600   heparin  5,000 Units Subcutaneous Q8H   lamiVUDine  50 mg Oral QAC breakfast   melatonin  3 mg Oral Q24H   methadone  2.5 mg Oral Q2000   mirtazapine  7.5 mg Oral QHS   multivitamin with minerals  1 tablet Oral Q24H   mupirocin ointment  1 application Nasal BID   pantoprazole  40 mg Oral BID AC   senna  2 tablet Oral Q24H   sodium chloride flush  3 mL Intravenous Q12H   venlafaxine XR  150 mg Oral Q breakfast   zidovudine  100 mg Oral 3 times per day      Gean Quint, MD [facility] 11/16/2020, 11:09 AM

## 2020-11-16 NOTE — Progress Notes (Signed)
[facility]   Subjective  - No complaints this morning.  Comfortable.  Moving the right hand.  Objective 136/66 79 98.4 F (36.9 C) (Oral) (!) 28 99% No intake or output data in the 24 hours ending 11/16/20 1310   Right arm motor and sensation intact Palpable thrill/pulsatile in upper arm fistula Lungs non labored breathing   Assessment/Planning: POD # 2 70 y.o. 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  Now with vacuum assisted dressing Patient tolerated HD using UE fistula   TOC pending return to SNF VAC change early next week  Broadus John 11/16/2020 1:10 PM --  Laboratory Lab Results: Recent Labs    11/14/20 0921  WBC 3.9*  HGB 7.4*  HCT 25.2*  PLT 161    BMET Recent Labs    11/14/20 1000  NA 134*  K 4.4  CL 95*  CO2 29  GLUCOSE 81  BUN 24*  CREATININE 6.87*  CALCIUM 9.5     COAG Lab Results  Component Value Date   INR 1.14 12/28/2011   INR 1.10 12/13/2010   INR 1.10 01/26/2010   No results found for: PTT  Broadus John MD

## 2020-11-16 NOTE — Progress Notes (Signed)
PT Cancellation Note  Patient Details Name: [name] MRN: [id number] DOB: 24-Nov-1950   Cancelled Treatment:    Reason Eval/Treat Not Completed: Patient at procedure or test/unavailable on multiple attempts this morning, at HD. Will continue to follow and attempt evaluation as time/schedule allows.   [place], PT, DPT   Acute Rehabilitation Department Pager #: [phone]   Sandra Cockayne 11/16/2020, 2:01 PM

## 2020-11-16 NOTE — Progress Notes (Signed)
Report received from primary nurse Kristin Bruins, RN pt. Has wound vac to right upper fistula. Dr. Candiss Norse called and made aware. Orders to contact wound care to remove wound vac so HD to access fistula. This writer notified Kristin Bruins, RN of Dr. Candiss Norse orders. Nurse states that vascular will be in shortly to verify. Will make oncoming nurse aware.

## 2020-11-16 NOTE — Progress Notes (Signed)
Hemodialysis RN notified this RN that the wound vac to right lower arm AV fistula needs to be removed in order to access the AV fistula per Dr. Candiss Norse. Message passed on to [facility] to follow up with the vascular team about this.

## 2020-11-16 NOTE — Progress Notes (Signed)
OT Cancellation Note  Patient Details Name: [name] MRN: [id number] DOB: September 12, 1950   Cancelled Treatment:    Reason Eval/Treat Not Completed: Patient at procedure or test/ unavailable.  At HD, will continue efforts as appropriate.   Belen Pesch D Gemayel Mascio 11/16/2020, 2:13 PM

## 2020-11-17 LAB — GLUCOSE, CAPILLARY: Glucose-Capillary: 74 mg/dL (ref 70–99)

## 2020-11-17 NOTE — Progress Notes (Addendum)
   VASCULAR SURGERY ASSESSMENT & PLAN:   ESRD: POD # 3 70 y.o. 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  Now with vacuum assisted dressing to right forearm.  Patient developed right upper arm swelling after HD yesterday using UE fistula.  Likely infiltrated at time of treatment.  Ace wrap applied.  Fistula is patent.  Plan VAC change to right forearm tomorrow.  Disposition: Pending return to SNF  VASCULAR STAFF ADDENDUM: I have independently interviewed and examined the patient. I agree with the above.    Cassandria Santee, MD [facility] Phone Number: [phone] 11/17/2020 1:21 PM    SUBJECTIVE:   Complaining of right upper extremity soreness.  Complains of minimal numbness of right hand.  PHYSICAL EXAM:   Vitals:   11/17/20 0128 11/17/20 0424 11/17/20 0729 11/17/20 0943  BP: (!) 103/59 131/64 127/69   Pulse: 67 66 75 74  Resp: 12 13 19 16   Temp: 97.6 F (36.4 C) 97.6 F (36.4 C) 97.9 F (36.6 C)   TempSrc: Oral Oral Oral   SpO2: 99% 100% 100% 100%  Weight:  44.6 kg    Height:       General appearance: awake, alert in NAD Respirations: unlabored; no dyspnea at rest Right upper extremity: Hand is warm with 2/5 grip strength. Right hand is warm. Good bruit and thrill in fistula.  VAC dressing in place with good seal.  Minimal output.  LABS:   Lab Results  Component Value Date   WBC 3.9 (L) 11/14/2020   HGB 7.4 (L) 11/14/2020   HCT 25.2 (L) 11/14/2020   MCV 107.2 (H) 11/14/2020   PLT 161 11/14/2020   Lab Results  Component Value Date   CREATININE 6.87 (H) 11/14/2020   Lab Results  Component Value Date   INR 1.14 12/28/2011   CBG (last 3)  Recent Labs    11/15/20 2124 11/16/20 0611 11/17/20 0628  GLUCAP 97 84 74    PROBLEM LIST:    Active Problems:   ESRD (end stage renal disease) (HCC)   CURRENT MEDS:     vitamin C  500 mg Oral Q12H   calcium acetate  1,334 mg Oral TID WC   cinacalcet  60 mg Oral Q T,Th,Sa-HD   clopidogrel  75 mg Oral Q breakfast   darbepoetin (ARANESP) injection - DIALYSIS  100 mcg Intravenous Q Thu-HD   dolutegravir  50 mg Oral q1600   heparin  5,000 Units Subcutaneous Q8H   lamiVUDine  50 mg Oral QAC breakfast   melatonin  3 mg Oral Q24H   methadone  2.5 mg Oral Q2000   mirtazapine  7.5 mg Oral QHS   multivitamin with minerals  1 tablet Oral Q24H   pantoprazole  40 mg Oral BID AC   senna  2 tablet Oral Q24H   sodium chloride flush  3 mL Intravenous Q12H   venlafaxine XR  150 mg Oral Q breakfast   zidovudine  100 mg Oral 3 times per day    Barbie Banner, PA-C  Office: [phone] 11/17/2020

## 2020-11-17 NOTE — Progress Notes (Signed)
[name] Progress Note    Assessment/ Plan:   ESRD:  -outpatient orders: [name], TTS, Gambro 300, 350/500, 2k, 2.5cal, 15g needles, edw 46.5kg, receives hep 1k units and 500 units/hr -will maintain TTS schedule, next HD Tuesday - Dose all meds for creatinine clearance < 10 ml/min  - Unless absolutely necessary, no MRIs with gadolinium.   Volume/ hypertension: EDW 46.5kg. UF as tolerated Anemia of Chronic Kidney Disease: Hemoglobin 9.3  on presentation; down to 7.6 as of 9/15 (blood loss from surgery?), receives epogen 3600units TIW. Aranesp 127mcg starting 9/15. Transfuse prn, monitor cbc  Secondary Hyperparathyroidism/Hyperphosphatemia: Continue with binders; receives cinacalcet 60mg  and calcitriol 1.69mcg TIW  Vascular access: Right upper extremity fistula with good bruit, slightly pulsatile s/p rt cimino resection + ligation prox/distal + collaterals.. Have been able to cannulate Dispo: pending d/c back to SNF, PT/OT  Subjective:   No acute events, felt sick at the towards the end of treatment yesterday. She reports that this typically does happen with dialysis. Net uf 2L   Objective:   BP 127/69 (BP Location: Right Leg)   Pulse 75   Temp 97.9 F (36.6 C) (Oral)   Resp 19   Ht 5\' 5"  (1.651 m)   Wt 44.6 kg   LMP  (LMP Unknown)   SpO2 100%   BMI 16.36 kg/m   Intake/Output Summary (Last 24 hours) at 11/17/2020 0916 Last data filed at 11/17/2020 0752 Gross per 24 hour  Intake 30 ml  Output 2000 ml  Net -1970 ml    Weight change: -1.8 kg  Physical Exam: Gen:nad CVS:rrr Resp:normal wob GUR:KYHC, nt Ext: RUE swelling Neuro: awake, alert HD access: RUE AVF, woundvac+dressing in place  Imaging: No results found.  Labs: BMET Recent Labs  Lab 11/11/20 1017 11/11/20 1839 11/13/20 0257 11/14/20 1000  NA 141  --  136 134*  K 4.2  --  6.0* 4.4  CL 100  --  100 95*  CO2  --   --  24 29  GLUCOSE 78  --  109* 81  BUN 49*  --  59* 24*   CREATININE 11.40* 11.29* 13.20* 6.87*  CALCIUM  --   --  9.0 9.5  PHOS  --   --   --  3.7   CBC Recent Labs  Lab 11/11/20 1017 11/11/20 1839 11/13/20 0257 11/14/20 0921  WBC  --  2.8* 3.3* 3.9*  HGB 11.6* 9.3* 7.6* 7.4*  HCT 34.0* 31.2* 25.9* 25.2*  MCV  --  106.1* 107.5* 107.2*  PLT  --  144* 132* 161    Medications:     vitamin C  500 mg Oral Q12H   calcium acetate  1,334 mg Oral TID WC   cinacalcet  60 mg Oral Q T,Th,Sa-HD   clopidogrel  75 mg Oral Q breakfast   darbepoetin (ARANESP) injection - DIALYSIS  100 mcg Intravenous Q Thu-HD   dolutegravir  50 mg Oral q1600   heparin  5,000 Units Subcutaneous Q8H   lamiVUDine  50 mg Oral QAC breakfast   melatonin  3 mg Oral Q24H   methadone  2.5 mg Oral Q2000   mirtazapine  7.5 mg Oral QHS   multivitamin with minerals  1 tablet Oral Q24H   pantoprazole  40 mg Oral BID AC   senna  2 tablet Oral Q24H   sodium chloride flush  3 mL Intravenous Q12H   venlafaxine XR  150 mg Oral Q breakfast   zidovudine  100 mg  Oral 3 times per day      Gean Quint, MD [facility] 11/17/2020, 9:16 AM

## 2020-11-17 NOTE — Evaluation (Signed)
Occupational Therapy Evaluation Patient Details Name: [name] MRN: [id number] DOB: 1950-08-25 Today's Date: 11/17/2020   History of Present Illness Pt is a 70 y.o. female who presented with end-stage renal disease 11/11/20 for right A/V fistulagram. 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 PMH: anemia, anxiety, atrial flutter, C diff, depression, ESRD on HD, HIV, HTN, intracranial hemorrhage, pulmonary HTN, tachycardia, TIA, traumatic hematoma of R UE   Clinical Impression   Pt admitted for concerns and procedure listed above. PTA pt reported that she required assist for all ADL's and IADL's, which her SNF provides. At this time, pt functional mobility is limited due to pain and fatigue, she is requiring mod A for safety and to remain balanced. Pt unable to take steps away from bed without increased support. All ADL's requiring min guard to max A due to balance concerns and weakness. Recommend return to SNF. OT will follow acutely.       Recommendations for follow up therapy are one component of a multi-disciplinary discharge planning process, led by the attending physician.  Recommendations may be updated based on patient status, additional functional criteria and insurance authorization.   Follow Up Recommendations  SNF;Supervision/Assistance - 24 hour    Equipment Recommendations  None recommended by OT    Recommendations for Other Services       Precautions / Restrictions Precautions Precautions: Fall Restrictions Weight Bearing Restrictions: No      Mobility Bed Mobility Overal bed mobility: Needs Assistance Bed Mobility: Supine to Sit;Sit to Supine     Supine to sit: Max assist;HOB elevated Sit to supine: Max assist;HOB elevated   General bed mobility comments: Pt initiated movement of legs slightly towards EOB, but needed maxA with her holding onto therapist to pull trunk up to sit, pivot, and bring legs off EOB and to return to  supine.    Transfers Overall transfer level: Needs assistance Equipment used: Rolling walker (2 wheeled) Transfers: Sit to/from Stand Sit to Stand: Min assist;From elevated surface         General transfer comment: Min A to power up to stand, mod A for side steps    Balance Overall balance assessment: Needs assistance Sitting-balance support: No upper extremity supported;Single extremity supported;Feet supported Sitting balance-Leahy Scale: Fair   Postural control: Posterior lean Standing balance support: Bilateral upper extremity supported;During functional activity Standing balance-Leahy Scale: Poor Standing balance comment: Reliant on L UE support and min-modA.                           ADL either performed or assessed with clinical judgement   ADL Overall ADL's : Needs assistance/impaired Eating/Feeding: Minimal assistance;Sitting   Grooming: Minimal assistance;Sitting   Upper Body Bathing: Minimal assistance;Sitting   Lower Body Bathing: Maximal assistance;Sitting/lateral leans;Sit to/from stand   Upper Body Dressing : Minimal assistance;Sitting   Lower Body Dressing: Maximal assistance;Sitting/lateral leans;Sit to/from stand   Toilet Transfer: Moderate assistance;Stand-pivot   Toileting- Clothing Manipulation and Hygiene: Maximal assistance;Sitting/lateral lean;Sit to/from stand       Functional mobility during ADLs: Moderate assistance;Rolling walker General ADL Comments: Pt limited by pain this session, does not want to move much.     Vision Baseline Vision/History: 0 No visual deficits Ability to See in Adequate Light: 0 Adequate Patient Visual Report: No change from baseline Vision Assessment?: No apparent visual deficits     Perception Perception Perception Tested?: No   Praxis Praxis Praxis tested?: Not tested  Pertinent Vitals/Pain Pain Assessment: Faces Faces Pain Scale: Hurts whole lot Pain Location: RUE and bottom Pain  Descriptors / Indicators: Discomfort;Grimacing;Guarding;Operative site guarding Pain Intervention(s): Limited activity within patient's tolerance;Monitored during session;Repositioned     Hand Dominance Right   Extremity/Trunk Assessment Upper Extremity Assessment Upper Extremity Assessment: RUE deficits/detail RUE Deficits / Details: ACE wrap around entire R UE; pt able to lift against gravity slowly, but limited in mobility by pain RUE: Unable to fully assess due to pain RUE Coordination: decreased fine motor;decreased gross motor   Lower Extremity Assessment Lower Extremity Assessment: Defer to PT evaluation   Cervical / Trunk Assessment Cervical / Trunk Assessment: Kyphotic   Communication Communication Communication: No difficulties   Cognition Arousal/Alertness: Awake/alert Behavior During Therapy: WFL for tasks assessed/performed Overall Cognitive Status: No family/caregiver present to determine baseline cognitive functioning                                 General Comments: A&Ox4, but can be slow to respond, sometimes needing repetition of questions.   General Comments  VSS on RA    Exercises     Shoulder Instructions      Home Living Family/patient expects to be discharged to:: Skilled nursing facility                                 Additional Comments: Pt resides at [facility].      Prior Functioning/Environment Level of Independence: Needs assistance  Gait / Transfers Assistance Needed: Pt reports they prefer her to have assistance when getting up out of her w/c and walking, but she sometimes does it independently with her RW and sometimes without any AD, but does report falls. Pt reports she walks occasionally, but otherwise is mod I with mobility using her w/c. ADL's / Homemaking Assistance Needed: Pt reports she bathes herself at EOB and sometimes needs assistance with dressing and bathing.            OT Problem  List: Decreased strength;Decreased range of motion;Decreased activity tolerance;Impaired balance (sitting and/or standing);Decreased coordination;Decreased cognition;Decreased safety awareness;Decreased knowledge of use of DME or AE;Impaired sensation;Impaired UE functional use;Pain      OT Treatment/Interventions: Self-care/ADL training;Therapeutic exercise;Energy conservation;DME and/or AE instruction;Therapeutic activities;Patient/family education;Balance training    OT Goals(Current goals can be found in the care plan section) Acute Rehab OT Goals Patient Stated Goal: to go back to SNF OT Goal Formulation: With patient Time For Goal Achievement: 12/01/20 Potential to Achieve Goals: Fair  OT Frequency: Min 2X/week   Barriers to D/C:            Co-evaluation              AM-PAC OT "6 Clicks" Daily Activity     Outcome Measure Help from another person eating meals?: A Little Help from another person taking care of personal grooming?: A Little Help from another person toileting, which includes using toliet, bedpan, or urinal?: A Lot Help from another person bathing (including washing, rinsing, drying)?: A Lot Help from another person to put on and taking off regular upper body clothing?: A Little Help from another person to put on and taking off regular lower body clothing?: A Lot 6 Click Score: 15   End of Session Equipment Utilized During Treatment: Rolling walker;Gait belt Nurse Communication: Mobility status;Patient requests pain meds  Activity Tolerance: Patient  limited by pain;Patient limited by fatigue Patient left: in bed;with call bell/phone within reach;with bed alarm set  OT Visit Diagnosis: Unsteadiness on feet (R26.81);Other abnormalities of gait and mobility (R26.89);Muscle weakness (generalized) (M62.81)                Time: 5910-2890 OT Time Calculation (min): 23 min Charges:  OT General Charges $OT Visit: 1 Visit OT Evaluation $OT Eval Moderate  Complexity: 1 Mod OT Treatments $Self Care/Home Management : 8-22 mins  Laderrick Wilk H., OTR/L Acute Rehabilitation  Genevra Orne Elane Yolanda Bonine 11/17/2020, 4:49 PM

## 2020-11-17 NOTE — Evaluation (Signed)
Physical Therapy Evaluation Patient Details Name: [name] MRN: [id number] DOB: 1950/05/15 Today's Date: 11/17/2020  History of Present Illness  Pt is a 70 y.o. female who presented with end-stage renal disease 11/11/20 for right A/V fistulagram. 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 PMH: anemia, anxiety, atrial flutter, C diff, depression, ESRD on HD, HIV, HTN, intracranial hemorrhage, pulmonary HTN, tachycardia, TIA, traumatic hematoma of R UE   Clinical Impression  Pt presents with condition above and deficits mentioned below, see PT Problem List. PTA, she was residing at a SNF, primarily mobilizing with mod I in her wheelchair. However, pt reports she does ambulate occasionally, normally with a RW but sometimes without an AD. She reports she is supposed to have assistance when she ambulates but intermittently decides to do it independently. She reports hx of falls. Currently, pain is limiting her R UE mobility, but she is able to lift it against gravity slowly. In addition, pt displays deficits in lower extremity strength, balance, and activity tolerance. She is at high risk for falls. Pt required maxA for bed mobility, minA to transfer to stand, and modA to take small, shuffling steps laterally to transfer bed > wheelchair with L UE support. Recommend pt receive follow-up skilled PT at her SNF upon discharge. Will continue to follow acutely.     Recommendations for follow up therapy are one component of a multi-disciplinary discharge planning process, led by the attending physician.  Recommendations may be updated based on patient status, additional functional criteria and insurance authorization.  Follow Up Recommendations SNF;Supervision/Assistance - 24 hour    Equipment Recommendations  None recommended by PT    Recommendations for Other Services       Precautions / Restrictions Precautions Precautions: Fall Restrictions Weight Bearing  Restrictions: No      Mobility  Bed Mobility Overal bed mobility: Needs Assistance Bed Mobility: Supine to Sit     Supine to sit: Max assist;HOB elevated     General bed mobility comments: Pt initiated movement of legs slightly towards EOB, but needed maxA with her holding onto therapist to pull trunk up to sit, pivot, and bring legs off EOB.    Transfers Overall transfer level: Needs assistance Equipment used: 1 person hand held assist Transfers: Sit to/from [facility] Sit to Stand: Min assist;From elevated surface Stand pivot transfers: Mod assist       General transfer comment: Sit to stand from elevated EOB with minA and pt holding onto therapist with L UE. ModA for stand step to L bed > recliner.  Ambulation/Gait Ambulation/Gait assistance: Mod assist Gait Distance (Feet): 1 Feet Assistive device: 1 person hand held assist Gait Pattern/deviations: Step-to pattern;Decreased stride length;Shuffle;Narrow base of support;Trunk flexed Gait velocity: reduced Gait velocity interpretation: <1.31 ft/sec, indicative of household ambulator General Gait Details: Pt with very narrow stance, shuffling feet on ground to transfer bed > recliner with L UE support on therapist. No full clearance of feet with each step.  Stairs            Wheelchair Mobility    Modified Rankin (Stroke Patients Only)       Balance Overall balance assessment: Needs assistance Sitting-balance support: No upper extremity supported;Single extremity supported;Feet supported Sitting balance-Leahy Scale: Fair Sitting balance - Comments: Initially needed min-modA to prevent L lateral and posterior LOB, progressed to min guard assist with L hand on bed then briefly with no UE support. Postural control: Posterior lean;Left lateral lean Standing balance support: Single  extremity supported;During functional activity Standing balance-Leahy Scale: Poor Standing balance comment: Reliant  on L UE support and min-modA.                             Pertinent Vitals/Pain Pain Assessment: Faces Faces Pain Scale: Hurts even more Pain Location: R UE Pain Descriptors / Indicators: Discomfort;Grimacing;Guarding;Operative site guarding Pain Intervention(s): Limited activity within patient's tolerance;Monitored during session;Repositioned    Home Living Family/patient expects to be discharged to:: Skilled nursing facility                 Additional Comments: Pt resides at [facility]    Prior Function Level of Independence: Needs assistance   Gait / Transfers Assistance Needed: Pt reports they prefer her to have assistance when getting up out of her w/c and walking, but she sometimes does it independently with her RW and sometimes without any AD, but does report falls. Pt reports she walks occasionally, but otherwise is mod I with mobility using her w/c.  ADL's / Homemaking Assistance Needed: Pt reports she bathes herself at EOB and sometimes needs assistance with dressing and bathing.        Hand Dominance        Extremity/Trunk Assessment   Upper Extremity Assessment Upper Extremity Assessment: RUE deficits/detail RUE Deficits / Details: ACE wrap around entire R UE; pt able to lift against gravity slowly, but limited in mobility by pain RUE: Unable to fully assess due to pain    Lower Extremity Assessment Lower Extremity Assessment: Generalized weakness (MMT scores of 4 for hip flexion and 4+ for knee extension bil, but weakness noted functionally; denied numbness/tingling)    Cervical / Trunk Assessment Cervical / Trunk Assessment: Kyphotic  Communication   Communication: Other (comment) (soft spoken)  Cognition Arousal/Alertness: Awake/alert Behavior During Therapy: WFL for tasks assessed/performed Overall Cognitive Status: No family/caregiver present to determine baseline cognitive functioning                                  General Comments: A&Ox4, but can be slow to respond, sometimes needing repetition of questions.      General Comments General comments (skin integrity, edema, etc.): Upon arrival, pt appeared to be asleep with her eyes slightly open and RR at 1-2, awoke pt easily with rubbing her leg and her RR increased to 7-11, RN aware    Exercises     Assessment/Plan    PT Assessment Patient needs continued PT services  PT Problem List Decreased strength;Decreased range of motion;Decreased activity tolerance;Decreased balance;Decreased mobility;Decreased coordination;Decreased knowledge of use of DME;Pain       PT Treatment Interventions DME instruction;Gait training;Functional mobility training;Therapeutic exercise;Therapeutic activities;Balance training;Neuromuscular re-education;Cognitive remediation;Patient/family education;Wheelchair mobility training    PT Goals (Current goals can be found in the Care Plan section)  Acute Rehab PT Goals Patient Stated Goal: to go back to SNF PT Goal Formulation: With patient Time For Goal Achievement: 12/01/20 Potential to Achieve Goals: Fair    Frequency Min 2X/week   Barriers to discharge        Co-evaluation               AM-PAC PT "6 Clicks" Mobility  Outcome Measure Help needed turning from your back to your side while in a flat bed without using bedrails?: A Lot Help needed moving from lying on your back to sitting  on the side of a flat bed without using bedrails?: A Lot Help needed moving to and from a bed to a chair (including a wheelchair)?: A Lot Help needed standing up from a chair using your arms (e.g., wheelchair or bedside chair)?: A Little Help needed to walk in hospital room?: A Lot Help needed climbing 3-5 steps with a railing? : Total 6 Click Score: 12    End of Session Equipment Utilized During Treatment: Gait belt Activity Tolerance: Patient limited by pain Patient left: in chair;with call bell/phone within  reach;with chair alarm set (in w/c) Nurse Communication: Mobility status;Other (comment) (RR) PT Visit Diagnosis: Unsteadiness on feet (R26.81);Other abnormalities of gait and mobility (R26.89);Muscle weakness (generalized) (M62.81);History of falling (Z91.81);Difficulty in walking, not elsewhere classified (R26.2);Pain Pain - Right/Left: Right Pain - part of body: Arm    Time: 1203-1228 PT Time Calculation (min) (ACUTE ONLY): 25 min   Charges:   PT Evaluation $PT Eval Moderate Complexity: 1 Mod PT Treatments $Therapeutic Activity: 8-22 mins        Moishe Spice, PT, DPT Acute Rehabilitation Services  Pager: [phone] Office: [phone]   Orvan Falconer 11/17/2020, 12:48 PM

## 2020-11-18 ENCOUNTER — Inpatient Hospital Stay (HOSPITAL_COMMUNITY): Payer: Medicare Other

## 2020-11-18 DIAGNOSIS — N186 End stage renal disease: Secondary | ICD-10-CM

## 2020-11-18 DIAGNOSIS — I739 Peripheral vascular disease, unspecified: Secondary | ICD-10-CM | POA: Diagnosis not present

## 2020-11-18 DIAGNOSIS — R4182 Altered mental status, unspecified: Secondary | ICD-10-CM | POA: Diagnosis not present

## 2020-11-18 DIAGNOSIS — R079 Chest pain, unspecified: Secondary | ICD-10-CM

## 2020-11-18 LAB — CBC
HCT: 23.4 % — ABNORMAL LOW (ref 36.0–46.0)
Hemoglobin: 7 g/dL — ABNORMAL LOW (ref 12.0–15.0)
MCH: 32.4 pg (ref 26.0–34.0)
MCHC: 29.9 g/dL — ABNORMAL LOW (ref 30.0–36.0)
MCV: 108.3 fL — ABNORMAL HIGH (ref 80.0–100.0)
Platelets: 216 10*3/uL (ref 150–400)
RBC: 2.16 MIL/uL — ABNORMAL LOW (ref 3.87–5.11)
RDW: 15.3 % (ref 11.5–15.5)
WBC: 3.2 10*3/uL — ABNORMAL LOW (ref 4.0–10.5)
nRBC: 0 % (ref 0.0–0.2)

## 2020-11-18 LAB — GLUCOSE, CAPILLARY
Glucose-Capillary: 82 mg/dL (ref 70–99)
Glucose-Capillary: 94 mg/dL (ref 70–99)

## 2020-11-18 LAB — RENAL FUNCTION PANEL
Albumin: 2.5 g/dL — ABNORMAL LOW (ref 3.5–5.0)
Anion gap: 9 (ref 5–15)
BUN: 21 mg/dL (ref 8–23)
CO2: 28 mmol/L (ref 22–32)
Calcium: 9.7 mg/dL (ref 8.9–10.3)
Chloride: 96 mmol/L — ABNORMAL LOW (ref 98–111)
Creatinine, Ser: 6.18 mg/dL — ABNORMAL HIGH (ref 0.44–1.00)
GFR, Estimated: 7 mL/min — ABNORMAL LOW (ref >60)
Glucose, Bld: 77 mg/dL (ref 70–99)
Phosphorus: 2.9 mg/dL (ref 2.5–4.6)
Potassium: 3.9 mmol/L (ref 3.5–5.1)
Sodium: 133 mmol/L — ABNORMAL LOW (ref 135–145)

## 2020-11-18 LAB — TROPONIN I (HIGH SENSITIVITY)
Troponin I (High Sensitivity): 44 ng/L — ABNORMAL HIGH (ref <18)
Troponin I (High Sensitivity): 44 ng/L — ABNORMAL HIGH (ref <18)

## 2020-11-18 IMAGING — MR MR HEAD W/O CM
12 of 13 series · 43 of 48 positions shown · non-contrast
Comparison: None.

CLINICAL DATA: Mental status change, unknown cause

EXAM:
MRI HEAD WITHOUT CONTRAST
TECHNIQUE: Multiplanar, multiecho pulse sequences of the brain and surrounding
structures were obtained without intravenous contrast.

[Series 5: DWI · axial · 3.0mm · 0.88mm/px · z∈[-144,+1]mm · 7 of 100 slices shown (1 of 4)]
[im 1/100]
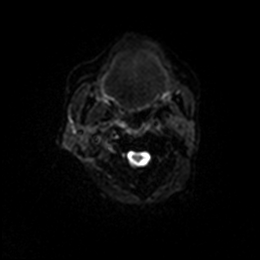
[im 17/100]
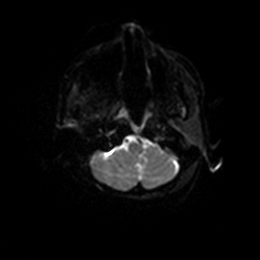
[im 34/100]
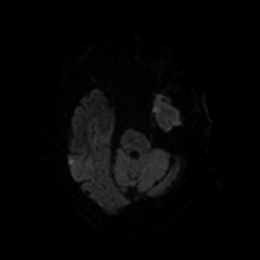
[im 50/100]
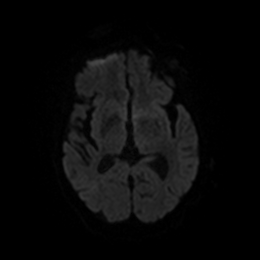
[im 67/100]
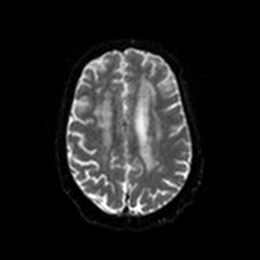
[im 83/100]
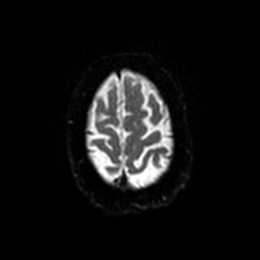
[im 100/100]
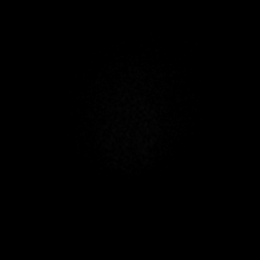

[Series 6: DWI · axial · 3.0mm · 0.88mm/px · z∈[-144,+1]mm · 3 of 50 slices shown (2 of 4)]
[im 1/50]
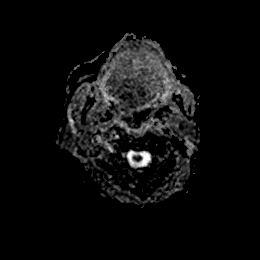
[im 25/50]
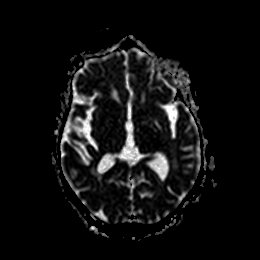
[im 50/50]
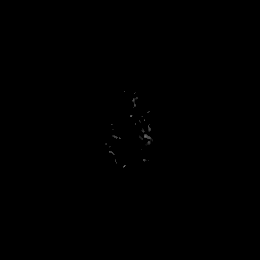

[Series 7: DWI · coronal · 4.0mm · 0.88mm/px · 5 of 64 slices shown (3 of 4)]
[im 1/64]
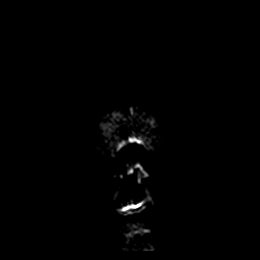
[im 16/64]
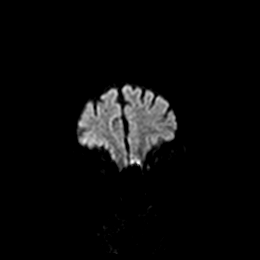
[im 32/64]
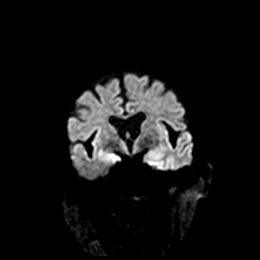
[im 48/64]
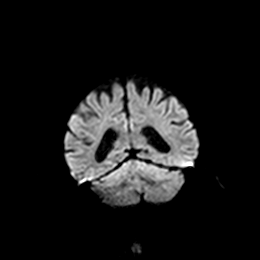
[im 64/64]
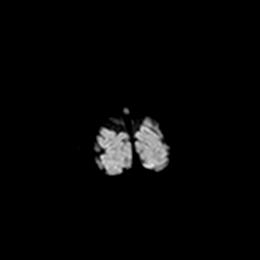

[Series 8: DWI · coronal · 4.0mm · 0.88mm/px · 2 of 32 slices shown (4 of 4)]
[im 1/32]
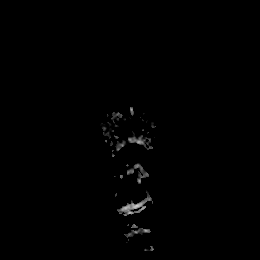
[im 32/32]
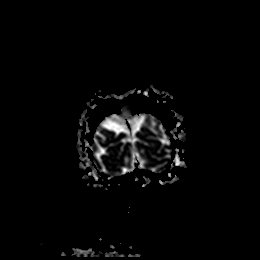

[Series 9: T1 · sagittal · 5.0mm · 0.75mm/px · 2 of 23 slices shown]
[im 1/23]
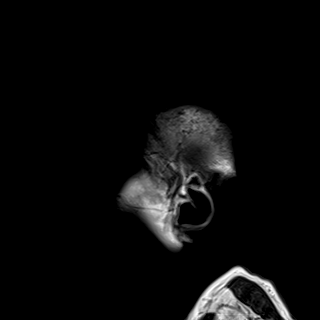
[im 23/23]
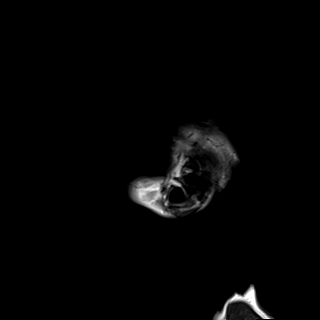

[Series 10: T2 · axial · 5.0mm · 0.72mm/px · z∈[-142,-0]mm · 2 of 25 slices shown (1 of 2)]
[im 1/25]
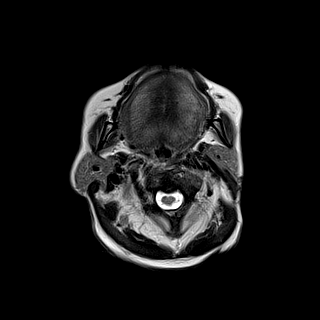
[im 25/25]
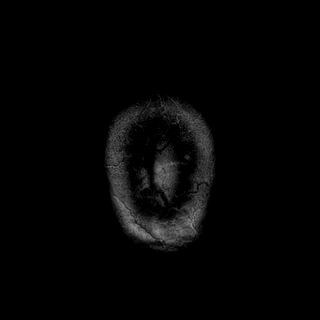

[Series 11: FLAIR · axial · 5.0mm · 0.45mm/px · z∈[-139,+3]mm · 2 of 25 slices shown]
[im 1/25]
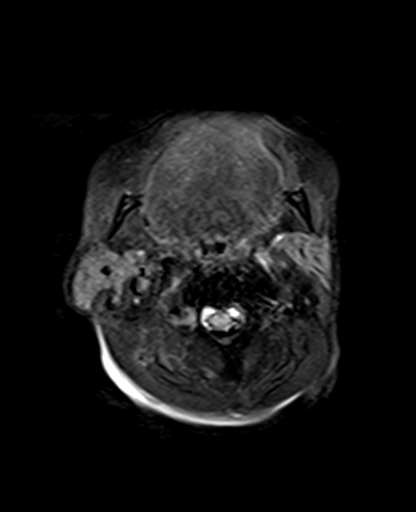
[im 25/25]
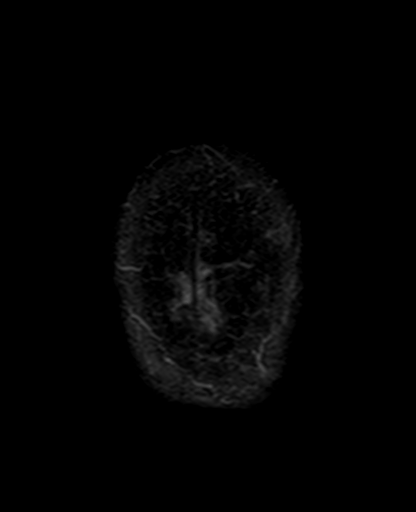

[Series 12: mag_images · axial · 3.0mm · 0.90mm/px · z∈[-158,+15]mm · 5 of 60 slices shown]
[im 1/60]
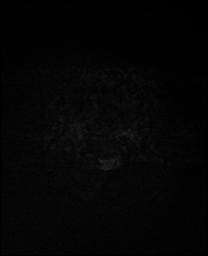
[im 15/60]
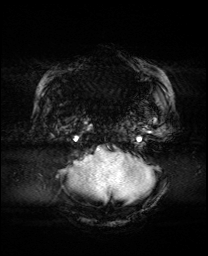
[im 30/60]
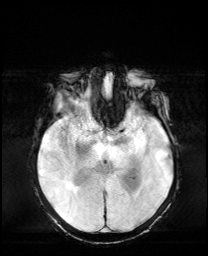
[im 45/60]
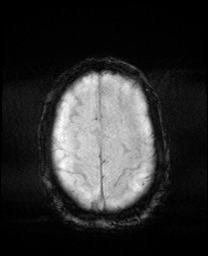
[im 60/60]
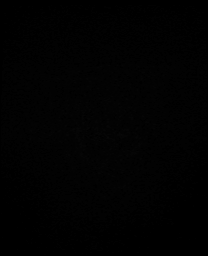

[Series 13: pha_images · axial · 3.0mm · 0.90mm/px · z∈[-152,+6]mm · 4 of 54 slices shown]
[im 1/54]
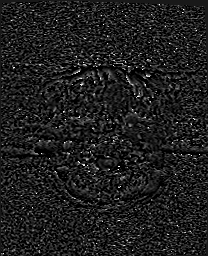
[im 18/54]
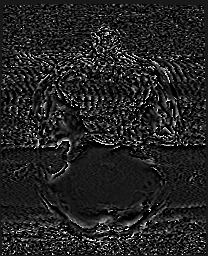
[im 36/54]
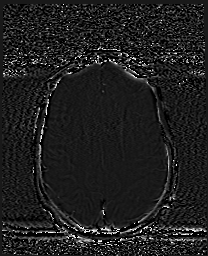
[im 54/54]
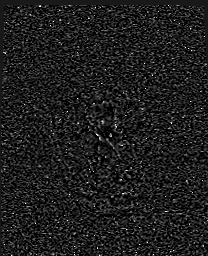

[Series 14: swi_images · axial · 3.0mm · 0.90mm/px · z∈[-158,+15]mm · 5 of 60 slices shown]
[im 1/60]
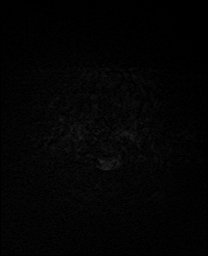
[im 15/60]
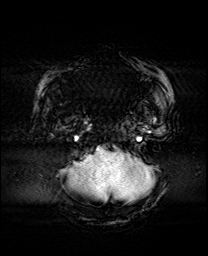
[im 30/60]
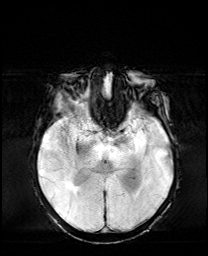
[im 45/60]
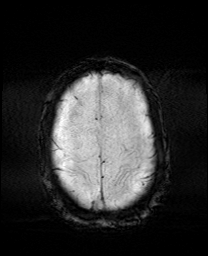
[im 60/60]
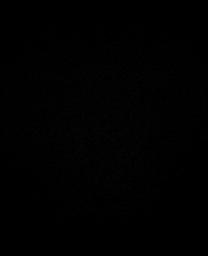

[Series 15: mip_images(sw) · axial · 24.0mm · 0.90mm/px · z∈[-147,+5]mm · 4 of 53 slices shown]
[im 1/53]
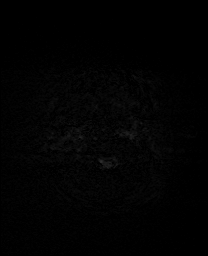
[im 18/53]
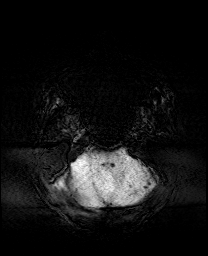
[im 35/53]
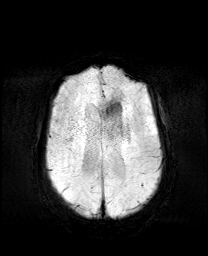
[im 53/53]
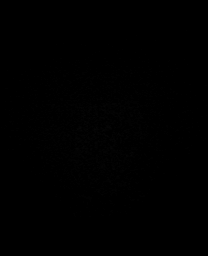

[Series 17: T2 · coronal · 5.0mm · 0.34mm/px · 2 of 29 slices shown (2 of 2)]
[im 1/29]
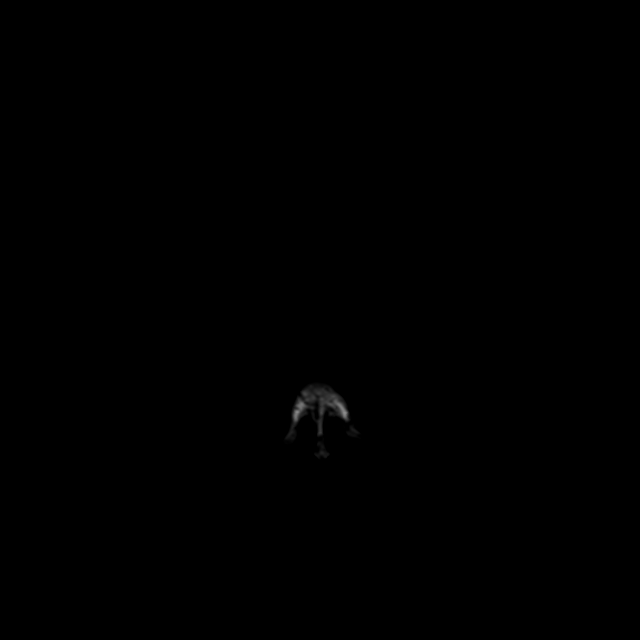
[im 29/29]
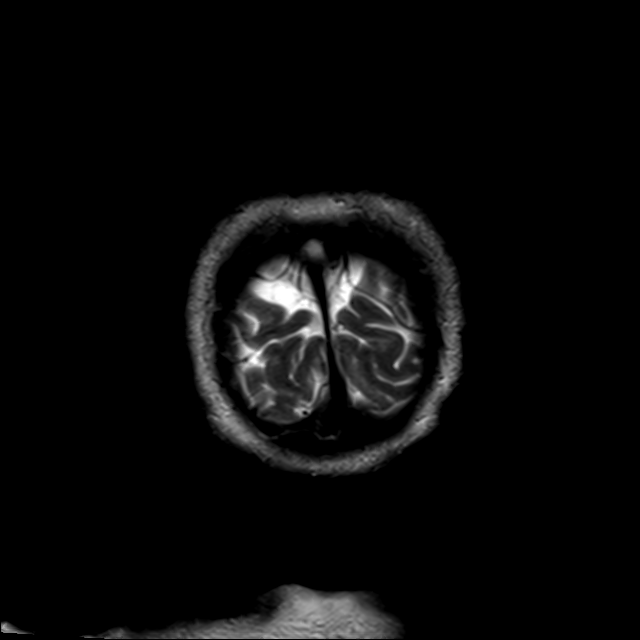

[43 of 48 positions shown; findings below may reference images not displayed]

FINDINGS: Brain: There is no acute infarction or intracranial hemorrhage.
There is no intracranial mass, mass effect, or edema. There is no
hydrocephalus or extra-axial fluid collection. Prominence of the
ventricles and sulci reflects mild generalized parenchymal volume
loss. Patchy and confluent areas of T2 hyperintensity in the
supratentorial white matter are nonspecific but may reflect moderate
chronic microvascular ischemic changes.

Vascular: Major vessel flow voids at the skull base are preserved.

Skull and upper cervical spine: Normal marrow signal is preserved.

Sinuses/Orbits: Paranasal sinuses are aerated. Orbits are
unremarkable.

Other: Sella is unremarkable.  Mastoid air cells are clear.
IMPRESSION: No evidence of recent infarction, hemorrhage, or mass.

Moderate chronic microvascular ischemic changes.

## 2020-11-18 MED ORDER — NALOXONE HCL 0.4 MG/ML IJ SOLN
0.4000 mg | INTRAMUSCULAR | Status: DC | PRN
  Administered 2020-11-19: 0.4 mg via INTRAVENOUS
  Filled 2020-11-18: qty 1

## 2020-11-18 MED ORDER — NALOXONE HCL 0.4 MG/ML IJ SOLN
INTRAMUSCULAR | Status: AC
  Administered 2020-11-18: 0.4 mg
  Filled 2020-11-18: qty 1

## 2020-11-18 MED ORDER — CHLORHEXIDINE GLUCONATE CLOTH 2 % EX PADS
6.0000 | MEDICATED_PAD | Freq: Every day | CUTANEOUS | Status: DC
  Administered 2020-11-18 – 2020-11-22 (×4): 6 via TOPICAL

## 2020-11-18 NOTE — Progress Notes (Addendum)
[facility]  Subjective  - Pain in right arm with manipulation   Objective (!) 180/82 70 98.1 F (36.7 C) (Oral) 15 100%  Intake/Output Summary (Last 24 hours) at 11/18/2020 0740 Last data filed at 11/17/2020 0752 Gross per 24 hour  Intake 30 ml  Output --  Net 30 ml    Wound vac changed today with WOC RN.  Good granulation with reduced tunnel now 1 cm deep.  Vac replaced. Reduced edema in right UE will d/c ace wrap Hand warm with palpable radial pulse   Assessment/Planning: POD # 4 70 y.o. 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  Now with vacuum assisted dressing to right forearm.  Patient did not appear as alert today on exam.  MRI was ordered to r/o possible CVA. MRI Brain: EXAM: MRI HEAD WITHOUT CONTRAST   TECHNIQUE: Multiplanar, multiecho pulse sequences of the brain and surrounding structures were obtained without intravenous contrast.   COMPARISON:  None.   FINDINGS: Brain: There is no acute infarction or intracranial hemorrhage. There is no intracranial mass, mass effect, or edema. There is no hydrocephalus or extra-axial fluid collection. Prominence of the ventricles and sulci reflects mild generalized parenchymal volume loss. Patchy and confluent areas of T2 hyperintensity in the supratentorial white matter are nonspecific but may reflect moderate chronic microvascular ischemic changes.   Vascular: Major vessel flow voids at the skull base are preserved.   Skull and upper cervical spine: Normal marrow signal is preserved.   Sinuses/Orbits: Paranasal sinuses are aerated. Orbits are unremarkable.   Other: Sella is unremarkable.  Mastoid air cells are clear.   IMPRESSION: No evidence of recent infarction, hemorrhage, or mass.   Moderate chronic microvascular ischemic changes.    Wound appears to be healing well HGB 7.0 a loss  of 0.4 over 48 hours likely due to ESRD.   Discussed lethargy with Dr. [name] and we have discontinued narcotic and will give a dose of Narcan.  She will receive blood products after HD tomorrow will order cbc and BMET for am.   Pending SNF once stable.    Roxy Horseman 11/18/2020 7:40 AM --  Laboratory Lab Results: Recent Labs    11/18/20 0334  WBC 3.2*  HGB 7.0*  HCT 23.4*  PLT 216   BMET Recent Labs    11/18/20 0334  NA 133*  K 3.9  CL 96*  CO2 28  GLUCOSE 77  BUN 21  CREATININE 6.18*  CALCIUM 9.7    COAG Lab Results  Component Value Date   INR 1.14 12/28/2011   INR 1.10 12/13/2010   INR 1.10 01/26/2010   No results found for: PTT  I have independently interviewed and examined patient and agree with PA assessment and plan above.  She was able to recognize me on exam and call me by name but she does seem a little bit sleepy relative to previous.  She has had a wound VAC change and did receive narcotics for this.  Her MRI is reassuring.  As far as her right upper extremity goes the edema in the upper arm is resolving and she has a very strong thrill.  Her antecubital incision is healing well with staples and she has a wound VAC on the arm.  She is pending SNF.  Idolina Mantell C. Donzetta Matters, MD [facility] Office: [phone] Pager: [phone]

## 2020-11-18 NOTE — Progress Notes (Addendum)
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  Patient receives dialysis on Tuesday Thursday Saturday basis.  Next dialysis treatment will be 11/19/2020.  Usual dialysis centers dialysis [facility]  Blood pressure 180/82 pulse 59 temperature 98.1 O2 sats 90% room air  Sodium 133 potassium 3.9 chloride 96 CO2 28 BUN 21 creatinine 6 glucose 77 phosphorus 2.9 hemoglobin 7   Some confusion this morning  -  noted dilaudid and Methadone on list of medications. Would try some narcan and would hold opiates. MRI reassuring for no infarct  Objective:  Vital signs in last 24 hours:  Temp:  [97.6 F (36.4 C)-98.2 F (36.8 C)] 98.1 F (36.7 C) (09/19 0443) Pulse Rate:  [60-75] 70 (09/19 0443) Resp:  [12-19] 15 (09/19 0443) BP: (95-180)/(58-89) 180/82 (09/19 0443) SpO2:  [100 %] 100 % (09/19 0135)  Weight change:  Filed Weights   11/16/20 1023 11/16/20 1410 11/17/20 0424  Weight: 44.8 kg 43 kg 44.6 kg    Intake/Output: I/O last 3 completed shifts: In: 30 [P.O.:30] Out: -    Intake/Output this shift:  No intake/output data recorded.  Gen:nad CVS:rrr Resp:normal wob GXQ:JJHE, nt Ext: RUE swelling Neuro: awake, alert HD access: RUE AVF, woundvac+dressing in place   Basic Metabolic Panel: Recent Labs  Lab 11/11/20 1017 11/11/20 1839 11/13/20 0257 11/14/20 1000 11/18/20 0334  NA 141  --  136 134* 133*  K 4.2  --  6.0* 4.4 3.9  CL 100  --  100 95* 96*  CO2  --   --  24 29 28   GLUCOSE 78  --  109* 81 77  BUN 49*  --  59* 24* 21  CREATININE 11.40* 11.29* 13.20* 6.87* 6.18*  CALCIUM  --   --  9.0 9.5 9.7  PHOS  --   --   --  3.7 2.9    Liver Function Tests: Recent Labs  Lab 11/14/20 1000 11/18/20 0334  ALBUMIN 2.9* 2.5*   No results for input(s): LIPASE, AMYLASE in the last 168 hours. No  results for input(s): AMMONIA in the last 168 hours.  CBC: Recent Labs  Lab 11/11/20 1017 11/11/20 1839 11/13/20 0257 11/14/20 0921 11/18/20 0334  WBC  --  2.8* 3.3* 3.9* 3.2*  HGB 11.6* 9.3* 7.6* 7.4* 7.0*  HCT 34.0* 31.2* 25.9* 25.2* 23.4*  MCV  --  106.1* 107.5* 107.2* 108.3*  PLT  --  144* 132* 161 216    Cardiac Enzymes: No results for input(s): CKTOTAL, CKMB, CKMBINDEX, TROPONINI in the last 168 hours.  BNP: Invalid input(s): POCBNP  CBG: Recent Labs  Lab 11/15/20 1211 11/15/20 1710 11/15/20 2124 11/16/20 0611 11/17/20 0628  GLUCAP 92 74 97 84 74    Microbiology: Results for orders placed or performed during the hospital encounter of 11/11/20  SARS CORONAVIRUS 2 (TAT 6-24 HRS) Nasopharyngeal Nasopharyngeal Swab     Status: None   Collection Time: 11/12/20  3:06 AM   Specimen: Nasopharyngeal Swab  Result Value Ref Range Status   SARS Coronavirus 2 NEGATIVE NEGATIVE Final    Comment: (NOTE) SARS-CoV-2 target nucleic acids are NOT DETECTED.  The SARS-CoV-2 RNA is generally detectable in upper and lower respiratory specimens during the acute phase of infection. Negative results do not preclude SARS-CoV-2 infection, do not rule out co-infections with other pathogens, and should not be used as  the sole basis for treatment or other patient management decisions. Negative results must be combined with clinical observations, patient history, and epidemiological information. The expected result is Negative.  Fact Sheet for Patients: SugarRoll.be  Fact Sheet for Healthcare Providers: https://www.woods-mathews.com/  This test is not yet approved or cleared by the [place] FDA and  has been authorized for detection and/or diagnosis of SARS-CoV-2 by FDA under an Emergency Use Authorization (EUA). This EUA will remain  in effect (meaning this test can be used) for the duration of the COVID-19 declaration under Se ction  564(b)(1) of the Act, 21 U.S.C. section 360bbb-3(b)(1), unless the authorization is terminated or revoked sooner.  Performed at [facility], [place] [address] [place], [place] [address]   Surgical pcr screen     Status: Abnormal   Collection Time: 11/12/20  4:52 AM   Specimen: Nasal Mucosa; Nasal Swab  Result Value Ref Range Status   MRSA, PCR POSITIVE (A) NEGATIVE Final    Comment: RESULT CALLED TO, READ BACK BY AND VERIFIED WITH: RN Kristin Bruins DAVIDSON 11/12/20@6 :32 BY TW    Staphylococcus aureus POSITIVE (A) NEGATIVE Final    Comment: RESULT CALLED TO, READ BACK BY AND VERIFIED WITH: RN Kristin Bruins DAVIDSON 11/12/20@6 :32 BY TW (NOTE) The Xpert SA Assay (FDA approved for NASAL specimens in patients 77 years of age and older), is one component of a comprehensive surveillance program. It is not intended to diagnose infection nor to guide or monitor treatment. Performed at [name], [place] [address], [place], [place] [address]   SARS CORONAVIRUS 2 (TAT 6-24 HRS) Nasopharyngeal Nasopharyngeal Swab     Status: None   Collection Time: 11/15/20  5:20 PM   Specimen: Nasopharyngeal Swab  Result Value Ref Range Status   SARS Coronavirus 2 NEGATIVE NEGATIVE Final    Comment: (NOTE) SARS-CoV-2 target nucleic acids are NOT DETECTED.  The SARS-CoV-2 RNA is generally detectable in upper and lower respiratory specimens during the acute phase of infection. Negative results do not preclude SARS-CoV-2 infection, do not rule out co-infections with other pathogens, and should not be used as the sole basis for treatment or other patient management decisions. Negative results must be combined with clinical observations, patient history, and epidemiological information. The expected result is Negative.  Fact Sheet for Patients: SugarRoll.be  Fact Sheet for Healthcare Providers: https://www.woods-mathews.com/  This test is not yet approved or cleared by  the [place] FDA and  has been authorized for detection and/or diagnosis of SARS-CoV-2 by FDA under an Emergency Use Authorization (EUA). This EUA will remain  in effect (meaning this test can be used) for the duration of the COVID-19 declaration under Se ction 564(b)(1) of the Act, 21 U.S.C. section 360bbb-3(b)(1), unless the authorization is terminated or revoked sooner.  Performed at [facility], [place] [address], [place], [place] [address]     Coagulation Studies: No results for input(s): LABPROT, INR in the last 72 hours.  Urinalysis: No results for input(s): COLORURINE, LABSPEC, PHURINE, GLUCOSEU, HGBUR, BILIRUBINUR, KETONESUR, PROTEINUR, UROBILINOGEN, NITRITE, LEUKOCYTESUR in the last 72 hours.  Invalid input(s): APPERANCEUR    Imaging: No results found.   Medications:    sodium chloride      vitamin C  500 mg Oral Q12H   calcium acetate  1,334 mg Oral TID WC   cinacalcet  60 mg Oral Q T,Th,Sa-HD   clopidogrel  75 mg Oral Q breakfast   darbepoetin (ARANESP) injection - DIALYSIS  100 mcg Intravenous  Q Thu-HD   dolutegravir  50 mg Oral q1600   heparin  5,000 Units Subcutaneous Q8H   lamiVUDine  50 mg Oral QAC breakfast   melatonin  3 mg Oral Q24H   methadone  2.5 mg Oral Q2000   mirtazapine  7.5 mg Oral QHS   multivitamin with minerals  1 tablet Oral Q24H   pantoprazole  40 mg Oral BID AC   senna  2 tablet Oral Q24H   sodium chloride flush  3 mL Intravenous Q12H   venlafaxine XR  150 mg Oral Q breakfast   zidovudine  100 mg Oral 3 times per day   sodium chloride, acetaminophen, baclofen, calcitRIOL, calcium acetate, hydrALAZINE, HYDROmorphone (DILAUDID) injection, labetalol, nitroGLYCERIN, ondansetron (ZOFRAN) IV, ondansetron, oxyCODONE, sodium chloride flush  Assessment/ Plan:   ESRD:  -outpatient orders: [name], TTS, [place] 300, 350/500, 2k, 2.5cal, 15g needles, edw 46.5kg, receives hep 1k units and 500 units/hr -will maintain TTS schedule,  next dialysis to be 11/19/2020 - Dose all meds for creatinine clearance < 10 ml/min  - Unless absolutely necessary, no MRIs with gadolinium.   Volume/ hypertension: EDW 46.5kg. UF as tolerated Anemia of Chronic Kidney Disease: Hemoglobin 9.3  on presentation; down to 7.6 as of 9/15 (blood loss from surgery?), receives epogen 3600units TIW. Aranesp 172mcg starting 9/15. Transfuse prn, monitor cbc  Secondary Hyperparathyroidism/Hyperphosphatemia: Continue with binders; receives cinacalcet 60mg  and calcitriol 1.51mcg TIW  Vascular access: Right upper extremity fistula with good bruit, slightly pulsatile s/p rt cimino resection + ligation prox/distal + collaterals.. Have been able to cannulate Dispo: pending d/c back to SNF, PT/OT  LOS: Ginger Blue @TODAY @7 :13 AM

## 2020-11-18 NOTE — TOC Progression Note (Signed)
Transition of Care [facility]) - Progression Note    Patient Details  Name: NOVELLA ABRAHA MRN: [id number] Date of Birth: 23-Nov-1950  Transition of Care [facility]) CM/SW [place], [place] Phone Number: 11/18/2020, 4:31 PM  Clinical Narrative:     CSW spoke with Melissa from [facility] who confirmed no insurance auth needed for patient to return when medically ready. CSW will continue to follow and assist with dc planning needs.  Expected Discharge Plan: Skilled Nursing Facility Barriers to Discharge: Insurance Authorization  Expected Discharge Plan and Services Expected Discharge Plan: [place] In-house Referral: Clinical Social Work     Living arrangements for the past 2 months: [place]                                       Social Determinants of Health (SDOH) Interventions    Readmission Risk Interventions Readmission Risk Prevention Plan 10/22/2019  Transportation Screening Complete  PCP or Specialist Appt within 3-5 Days Not Complete  Not Complete comments SNF MD to follow  [place] or [place] Not Complete  HRI or Home Care Consult comments Pt resides in a SNF  Social Work Consult for [place] Planning/Counseling Complete  Palliative Care Screening Not Applicable  Medication Review Press photographer) Complete  Some recent data might be hidden

## 2020-11-18 NOTE — Consult Note (Addendum)
Cardiology Consultation:   Patient ID: [name] MRN: [id number]; DOB: 04/25/1950  Admit date: 11/11/2020 Date of Consult: 11/18/2020  PCP:  Hilbert Corrigan, MD   [facility] Providers Cardiologist:  Dorris Carnes, MD  Patient Profile:   [name] is a 70 y.o. female with a history of isolated episode of atrial flutter in 2016 not on anticoagulation, prior TIA, PAD with prior stenting to right innominate artery, hypertension, ESRD on hemodialysis, thrombocytopenia, GERD, HIV, and chronic pain who is being seen for the evaluation of chest pain at the request of Dr. Donzetta Matters.  History of Present Illness:   [name] is a 70 year old with the above history who is followed by Dr. Harrington Challenger. Remote cardiac catheterization in 2014 at [facility] showed no CAD. She has a history of an isolated episode of atrial flutter in 2016 with no recurrence since then so has not been on anticoagulation. Last Echo in 01/2015 showed LVEF of 60-65% with mild MR. Patient was last seen by Dr. Harrington Challenger in 08/2019 after not having been seen since 2017 at which time she was doing well from a cardiac standpoint.  Recently, patient has had problems with right upper extremity pseudoaneurysm with concern for infection. She was seen by Dr. Donzetta Matters on 10/25/2020 and decision was made to proceed with fistulogram. 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 Patient was admitted and returned to the OR on 11/12/2020 for resection of distal fistula and conversion to upper arm fistula on the right. Patient was able to start hemodialysis using new fistula.  Today, patient was noted to have change in mental status. Per RN note, she was staring off in space and was very delayed in responding. She was also noted to have "jerking" of her upper body. Brain MRI was ordered and showed no  evidence of acute stroke. Cardiology was consulted for chest pain. EKG showed normal sinus rhythm, rate 86 bpm, with T wave inversions/mild ST depression in anterolateral leads. She has had transient ST/T changes in these leads before.  At the time of this evaluation, patient resting comfortably in no acute distress. It took me a while to wake her up. She did not respond to by voice but did wake up to mild sternal rub. After waking up, she stared off into the distance for the remainder of the visit. She was able to answer some questions but not all questions. She denied any chest pain and said "I am doing fine now." Also denied shortness of breath. When asked if she had any palpitations, she just kept saying "a little bit" but I could not get get to elaborate on this. Really unable to gain any other history. No family at bedside. I spoke with RN who states she complained of chest pain around 12:45pm while they were checking her BP. However, when they would ask her about this, she would say she had chest pain one moment and leg pain the next. Given EKG showed concerns for possible ischemia, she was given 1 dose of sublingual Nitro and has not mentioned any other chest pain since. RN also reported that mental status change is new today. They did give her Narcan this morning and patient initially became more talkative. However, she now seems to be staring off in space again and has very delayed responses if she responds. Opiates have been held.  Past Medical History:  Diagnosis Date   Anemia    Anxiety    Atrial flutter ([place]) 02/22/2015   C. difficile colitis 10/10/11   Chronic diarrhea    Chronic pain    Depression    ESRD on hemodialysis [facility])    Dialysis [name]   GERD (gastroesophageal reflux disease)    HIV (human immunodeficiency virus infection) ([place])    Hypertension    Insomnia    Intracranial hemorrhage (HCC)    Muscle weakness (generalized)    Pulmonary HTN (HCC)    Tachycardia     TIA (transient ischemic attack)    [facility] do not have this dx   Traumatic hematoma of right upper arm 02/22/2015    Past Surgical History:  Procedure Laterality Date   A/V FISTULAGRAM N/A 04/17/2016   Procedure: A/V Fistulagram - Right Upper;  Surgeon: Waynetta Sandy, MD;  Location: [place] CV LAB;  Service: Cardiovascular;  Laterality: N/A;   A/V FISTULAGRAM Right 08/28/2019   Procedure: A/V FISTULAGRAM;  Surgeon: Waynetta Sandy, MD;  Location: [place] CV LAB;  Service: Cardiovascular;  Laterality: Right;   ABDOMINAL HYSTERECTOMY     BIOPSY  01/02/2020   Procedure: BIOPSY;  Surgeon: Eloise Harman, DO;  [facility];  Service: Endoscopy;;   BIOPSY  07/11/2020   Procedure: BIOPSY;  Surgeon: Irving Copas., MD;  Location: [place];  Service: Gastroenterology;;   BIOPSY THYROID     CARPAL TUNNEL RELEASE Right 11/16/2019   Procedure: CARPAL TUNNEL RELEASE;  Surgeon: Leanora Cover, MD;  Location: [place];  Service: Orthopedics;  Laterality: Right;   COLONOSCOPY WITH PROPOFOL N/A 01/02/2020   Procedure: COLONOSCOPY WITH PROPOFOL;  Surgeon: Eloise Harman, DO;  [facility];  Service: Endoscopy;  Laterality: N/A;  1:15pm-[facility]   Dialysis Shunts     previous one removed from left arm and now present one in right arm   DIALYSIS/PERMA CATHETER INSERTION Right 04/17/2016   Procedure: dialysis Catheter Insertion central veinous;  Surgeon: Waynetta Sandy, MD;  Location: [place] CV LAB;  Service: Cardiovascular;  Laterality: Right;   ESOPHAGOGASTRODUODENOSCOPY  12/15/2010   [place]: erosive reflux esophagitis, MW tear, hiatal hernia (small), gastritis with no h.pylori, possibly nsaid related   ESOPHAGOGASTRODUODENOSCOPY (EGD) WITH PROPOFOL N/A 07/11/2020    Surgeon: Irving Copas., MD; normal esophagus, 4 cm hiatal hernia, gastritis biopsied (mild chronic gastritis, negative H.  pylori), single duodenal polyp (Brunner's gland hyperplasia), s/p duodenal biopsy benign, duodenal narrowing at the duodenal sweep.   EUS N/A 07/11/2020    Surgeon: Irving Copas., MD;  dilated CBD and common hepatic duct without stones or sludge in the bile duct, stones and biliary sludge in the gallbladder, pancreatic parenchyma with hyperechoic strands, pancreatic duct dilated with prominent sidebranches in the neck and body and tail, no pancreatic mass though visulalization limited, no ampullary lesion, no malignant appearing lymph node   EXCISION OF BREAST BIOPSY Right 05/04/2012   Procedure: EXCISION OF BREAST BIOPSY;  Surgeon: Donato Heinz, MD;  Location: [facility];  Service: General;  Laterality: Right;  Right Excisional Breast Biopsy   FISTULOGRAM Right 03/26/2014   Procedure: Right Arm Fistulogram with Venoplasty Right Subclavian Vein and Inominate Vein. Debridement Fistula Ulcer;  Surgeon: Conrad [place], MD;  Location: [place];  Service: Vascular;  Laterality: Right;   FISTULOGRAM Right 03/29/2017   Procedure: FISTULOGRAM COMPLEX RIGHT ARM with Balloon angioplasty;  Surgeon: Waynetta Sandy, MD;  Location: [place];  Service: Vascular;  Laterality: Right;   HEMOSTASIS CLIP PLACEMENT  07/11/2020   Procedure: HEMOSTASIS CLIP PLACEMENT;  Surgeon: Irving Copas., MD;  Location: [place];  Service: Gastroenterology;;   IR GENERIC HISTORICAL  05/19/2016   IR REMOVAL TUN CV CATH W/O FL 05/19/2016 Markus Daft, MD [facility]-INTERV RAD   LIGATION OF ARTERIOVENOUS  FISTULA Right 11/12/2020   Procedure: LIGATION OF ARTERIOVENOUS  FISTULA WITH EXCISION OF NECROTIC TISSUE;  Surgeon: Waynetta Sandy, MD;  Location: [place];  Service: Vascular;  Laterality: Right;   PERIPHERAL VASCULAR BALLOON ANGIOPLASTY Right 04/17/2016   Procedure: Peripheral Vascular Balloon Angioplasty;  Surgeon: Waynetta Sandy, MD;  Location: [place] CV LAB;  Service: Cardiovascular;  Laterality: Right;   Central segment AV Fistula   PERIPHERAL VASCULAR BALLOON ANGIOPLASTY Right 03/14/2018   Procedure: PERIPHERAL VASCULAR BALLOON ANGIOPLASTY;  Surgeon: Waynetta Sandy, MD;  Location: [place] CV LAB;  Service: Cardiovascular;  Laterality: Right;  subclavian vein   PERIPHERAL VASCULAR BALLOON ANGIOPLASTY Right 08/28/2019   Procedure: PERIPHERAL VASCULAR BALLOON ANGIOPLASTY;  Surgeon: Waynetta Sandy, MD;  Location: [place] CV LAB;  Service: Cardiovascular;  Laterality: Right;  upper arm   PERIPHERAL VASCULAR BALLOON ANGIOPLASTY Right 11/11/2020   Procedure: PERIPHERAL VASCULAR BALLOON ANGIOPLASTY;  Surgeon: Waynetta Sandy, MD;  Location: [place] CV LAB;  Service: Cardiovascular;  Laterality: Right;   PERIPHERAL VASCULAR INTERVENTION Right 03/14/2018   Procedure: PERIPHERAL VASCULAR INTERVENTION;  Surgeon: Waynetta Sandy, MD;  Location: [place] CV LAB;  Service: Cardiovascular;  Laterality: Right;  subclavian vein   POLYPECTOMY  07/11/2020   Procedure: POLYPECTOMY;  Surgeon: Mansouraty, Telford Nab., MD;  Location: [facility] END[facility]COPY;  Service: Gastroenterology;;   REVISON OF ARTERIOVENOUS FISTULA Right 11/12/2020   Procedure: CREATION OF RIGHT ARM ARTERIOVENOUS FISTULA;  Surgeon: Waynetta Sandy, MD;  Location: [place];  Service: Vascular;  Laterality: Right;   SHUNTOGRAM Right 10/19/2013   Procedure: FISTULOGRAM;  Surgeon: Conrad Massanetta [place], MD;  Location: [facility] CATH LAB;  Service: Cardiovascular;  Laterality: Right;   TONSILLECTOMY     VISCERAL VENOGRAPHY Right 03/14/2018   Procedure: CENTRAL VENOGRAPHY;  Surgeon: Waynetta Sandy, MD;  Location: [place] CV LAB;  Service: Cardiovascular;  Laterality: Right;  upper ext fistula     Home Medications:  Prior to Admission medications   Medication Sig Start Date End Date Taking? Authorizing Provider  Baclofen 5 MG TABS Take 5 mg by mouth daily as needed (muscle spasms).   Yes [provider]  Biotin 5000 MCG TABS Take 5,000 mcg by mouth daily with breakfast. (0800)   Yes [provider]  calcium acetate (PHOSLO) 667 MG capsule Take 667-1,334 mg by mouth See admin instructions. Take 2 capsules (1334 mg) by mouth 3 times daily at 0800, 1200 & 1700.  Take 1 capsule (667 mg) by mouth twice daily at 1000 & 1400. 10/03/20  Yes [provider]  clopidogrel (PLAVIX) 75 MG tablet Take 1 tablet (75 mg total) by mouth daily with breakfast. Patient taking differently: Take 75 mg by mouth daily with breakfast. (0800) 07/14/20  Yes Mansouraty, Telford Nab., MD  cyanocobalamin (,VITAMIN B-12,) 1000 MCG/ML injection Inject 1,000 mcg into the muscle every 30 (thirty) days. On the 28th of every month   Yes [provider]  dolutegravir (TIVICAY) 50 MG tablet Take 50 mg by mouth daily at 4 PM.   Yes [provider]  Emollient (  MINERIN) LOTN Apply 1 application topically every 12 (twelve) hours as needed (dry skin). Apply to feet and ankles   Yes [provider]  fentaNYL (DURAGESIC) 25 MCG/HR Place 1 patch onto the skin every 3 (three) days.   Yes [provider]  lamivudine (EPIVIR) 100 MG tablet Take 50 mg by mouth daily. (0800)   Yes [provider]  Lidocaine HCl 4 % CREA Apply 1 application topically every 12 (twelve) hours as needed (pain).   Yes [provider]  lidocaine-prilocaine (EMLA) cream Apply 1 application topically See admin instructions. (0900) Apply to fistula site one hour before dialysis on Tuesday, Thursday, and Saturday.   Yes [provider]  melatonin 3 MG TABS tablet Take 3 mg by mouth at bedtime. (2000)   Yes [provider]  methadone (DOLOPHINE) 5 MG tablet Take 2.5 mg by mouth daily at 8 pm. (2000) 08/02/19  Yes [provider]  mirtazapine (REMERON) 7.5 MG tablet Take 7.5 mg by mouth at bedtime. (2100)   Yes [provider]  Multiple Vitamin (MULTIVITAMIN WITH  MINERALS) TABS tablet Take 1 tablet by mouth at bedtime. (2000)   Yes [provider]  ondansetron (ZOFRAN) 4 MG tablet Take 4 mg by mouth every 8 (eight) hours as needed for nausea or vomiting.   Yes [provider]  Oxycodone HCl 10 MG TABS Take 1 tablet (10 mg total) by mouth every 12 (twelve) hours as needed (severe pain). 07/22/19  Yes Tat, Shanon Brow, MD  pantoprazole (PROTONIX) 40 MG tablet Take 1 tablet (40 mg total) by mouth 2 (two) times daily before a meal. Patient taking differently: Take 40 mg by mouth daily. 08/05/20  Yes Erenest Rasher, PA-C  promethazine (PHENERGAN) 12.5 MG tablet Take 12.5 mg by mouth See admin instructions. Take 1 tablet (12.5 mg) by mouth in the morning every Tuesday, Thursday and Saturday for nausea/vomiting and every 6 hours as needed for n/v   Yes [provider]  senna (SENOKOT) 8.6 MG tablet Take 2 tablets by mouth every evening. (2100)   Yes [provider]  venlafaxine XR (EFFEXOR-XR) 150 MG 24 hr capsule Take 150 mg by mouth daily with breakfast. (0900)   Yes [provider]  vitamin C (ASCORBIC ACID) 500 MG tablet Take 500 mg by mouth 2 (two) times daily. (0900 & 2100)   Yes [provider]  zidovudine (RETROVIR) 100 MG capsule Take 100 mg by mouth 3 (three) times daily. (0900, 1300, & 2100)   Yes [provider]  bacitracin 500 UNIT/GM ointment Apply 1 application topically every 8 (eight) hours as needed for wound care.    [provider]  nitroGLYCERIN (NITROSTAT) 0.4 MG SL tablet Place 0.4 mg under the tongue every 5 (five) minutes x 3 doses as needed for chest pain.     [provider]    Inpatient Medications: Scheduled Meds:  vitamin C  500 mg Oral Q12H   calcium acetate  1,334 mg Oral TID WC   Chlorhexidine Gluconate Cloth  6 each Topical Q0600   cinacalcet  60 mg Oral Q T,Th,Sa-HD   clopidogrel  75 mg Oral Q breakfast   darbepoetin (ARANESP) injection - DIALYSIS  100  mcg Intravenous Q Thu-HD   dolutegravir  50 mg Oral q1600   heparin  5,000 Units Subcutaneous Q8H   lamiVUDine  50 mg Oral QAC breakfast   melatonin  3 mg Oral Q24H   mirtazapine  7.5 mg Oral QHS  multivitamin with minerals  1 tablet Oral Q24H   pantoprazole  40 mg Oral BID AC   senna  2 tablet Oral Q24H   sodium chloride flush  3 mL Intravenous Q12H   venlafaxine XR  150 mg Oral Q breakfast   zidovudine  100 mg Oral 3 times per day   Continuous Infusions:  sodium chloride     PRN Meds: sodium chloride, acetaminophen, calcitRIOL, calcium acetate, hydrALAZINE, labetalol, naLOXone (NARCAN)  injection, nitroGLYCERIN, ondansetron (ZOFRAN) IV, ondansetron, sodium chloride flush  Allergies:    Allergies  Allergen Reactions   Lactose Intolerance (Gi) Other (See Comments)    On MAR   Latex Rash and Itching   Penicillins Rash and Swelling    Has patient had a PCN reaction causing immediate rash, facial/tongue/throat swelling, SOB or lightheadedness with hypotension: No Has patient had a PCN reaction causing severe rash involving mucus membranes or skin necrosis: No Has patient had a PCN reaction that required hospitalization No Has patient had a PCN reaction occurring within the last 10 years: No If all of the above answers are "NO", then may proceed with Cephalosporin use.      Social History:   Social History   Socioeconomic History   Marital status: Widowed    Spouse name: Not on file   Number of children: 1   Years of education: Not on file   Highest education level: Not on file  Occupational History   Not on file  Tobacco Use   Smoking status: Former    Types: Cigarettes    Quit date: 03/12/1983    Years since quitting: 37.7   Smokeless tobacco: Never  Vaping Use   Vaping Use: Never used  Substance and Sexual Activity   Alcohol use: No    Alcohol/week: 0.0 standard drinks   Drug use: No   Sexual activity: Not Currently    Birth control/protection: Surgical     Comment: Hysterectomy  Other Topics Concern   Not on file  Social History Narrative   Not on file   Social Determinants of Health   Financial Resource Strain: Not on file  Food Insecurity: Not on file  Transportation Needs: Not on file  Physical Activity: Not on file  Stress: Not on file  Social Connections: Not on file  Intimate Partner Violence: Not on file    Family History:    Family History  Problem Relation Age of Onset   Anesthesia problems Neg Hx    Hypotension Neg Hx    Malignant hyperthermia Neg Hx    Pseudochol deficiency Neg Hx    Colon cancer Neg Hx        Unsure of parents who both died when she was a baby     ROS:  Please see the history of present illness.  Review of Systems  Unable to perform ROS: Mental status change    Physical Exam/Data:   Vitals:   11/18/20 0135 11/18/20 0443 11/18/20 0749 11/18/20 1240  BP: 116/69 (!) 180/82 (!) 151/87 (!) 187/86  Pulse: 61 70 82 82  Resp: 18 15 20 18   Temp:  98.1 F (36.7 C) 98.6 F (37 C) 98.2 F (36.8 C)  TempSrc:  Oral Oral Oral  SpO2: 100%  100% 96%  Weight:      Height:       No intake or output data in the 24 hours ending 11/18/20 1457 Last 3 Weights 11/17/2020 11/16/2020 11/16/2020  Weight (lbs) 98 lb 5.2 oz 94  lb 12.8 oz 98 lb 12.3 oz  Weight (kg) 44.6 kg 43 kg 44.8 kg     Body mass index is 16.36 kg/m.  General: 70 y.o. African-American female resting comfortably in no acute distress.  HEENT: Normocephalic and atraumatic. Sclera clear. EOMs intact. Neck: Supple. No JVD. Heart: RRR. Distinct S1 and S2. II/VI systolic murmur. No gallops or rubs. Lungs: No increased work of breathing. Clear to ausculation anteriorly. No wheezes, rhonchi, or rales noted. Abdomen: Soft, non-distended, and non-tender to palpation. Bowel sounds present. Extremities: No lower extremity edema.    Skin: Warm and dry. Neuro: Alert but not full oriented. Staring off in space and very delayed responses to questions (if  she responds).  Psych: Staring off in space. Not responding appropriately.  EKG:  The EKG was personally reviewed and demonstrates:  Normal sinus rhythm, rate 86 bpm, with T wave inversions/mild ST depression in anterolateral leads.  Telemetry:  Telemetry was personally reviewed and demonstrates:  Normal sinus rhythm, with rates in the 60s. Occasional PACs/PVCs.  Relevant CV Studies:  Left Cardiac Catheterization 09/21/2012 ([facility]): Summary: - Left Main:  normal  - LAD system:  normal  - LCX system:  normal  - RCA system:  normal   No significant CAD indicated. _______________  Echocardiogram 02/21/2015: Study Conclusions: - Left ventricle: The cavity size was normal. Wall thickness was    normal. Systolic function was normal. The estimated ejection    fraction was in the range of 60% to 65%.  - Mitral valve: There was mild regurgitation.  - Right atrium: The atrium was mildly dilated.  Laboratory Data:  High Sensitivity Troponin:  No results for input(s): TROPONINIHS in the last 720 hours.   Chemistry Recent Labs  Lab 11/13/20 0257 11/14/20 1000 11/18/20 0334  NA 136 134* 133*  K 6.0* 4.4 3.9  CL 100 95* 96*  CO2 24 29 28   GLUCOSE 109* 81 77  BUN 59* 24* 21  CREATININE 13.20* 6.87* 6.18*  CALCIUM 9.0 9.5 9.7  GFRNONAA 3* 6* 7*  ANIONGAP 12 10 9     Recent Labs  Lab 11/14/20 1000 11/18/20 0334  ALBUMIN 2.9* 2.5*   Lipids No results for input(s): CHOL, TRIG, HDL, LABVLDL, LDLCALC, CHOLHDL in the last 168 hours.  Hematology Recent Labs  Lab 11/13/20 0257 11/14/20 0921 11/18/20 0334  WBC 3.3* 3.9* 3.2*  RBC 2.41* 2.35* 2.16*  HGB 7.6* 7.4* 7.0*  HCT 25.9* 25.2* 23.4*  MCV 107.5* 107.2* 108.3*  MCH 31.5 31.5 32.4  MCHC 29.3* 29.4* 29.9*  RDW 14.9 15.1 15.3  PLT 132* 161 216   Thyroid No results for input(s): TSH, FREET4 in the last 168 hours.  BNPNo results for input(s): BNP, PROBNP in the last 168 hours.  DDimer No results for input(s): DDIMER in the  last 168 hours.   Radiology/Studies:  MR BRAIN WO CONTRAST  Result Date: 11/18/2020 CLINICAL DATA:  Mental status change, unknown cause EXAM: MRI HEAD WITHOUT CONTRAST TECHNIQUE: Multiplanar, multiecho pulse sequences of the brain and surrounding structures were obtained without intravenous contrast. COMPARISON:  None. FINDINGS: Brain: There is no acute infarction or intracranial hemorrhage. There is no intracranial mass, mass effect, or edema. There is no hydrocephalus or extra-axial fluid collection. Prominence of the ventricles and sulci reflects mild generalized parenchymal volume loss. Patchy and confluent areas of T2 hyperintensity in the supratentorial white matter are nonspecific but may reflect moderate chronic microvascular ischemic changes. Vascular: Major vessel flow voids at the skull base are preserved.  Skull and upper cervical spine: Normal marrow signal is preserved. Sinuses/Orbits: Paranasal sinuses are aerated. Orbits are unremarkable. Other: Sella is unremarkable.  Mastoid air cells are clear. IMPRESSION: No evidence of recent infarction, hemorrhage, or mass. Moderate chronic microvascular ischemic changes. Electronically Signed   By: Macy Mis M.D.   On: 11/18/2020 09:49     Assessment and Plan:   Questionable Chest Pain - Patient had questionable episode of chest pain earlier this afternoon. However, she has had mental status changes today so difficult to get reliable history.  - EKG showed showed normal sinus rhythm, rate 86 bpm, with T wave inversions/mild ST depression in anterolateral leads. She has had transient ST/T changes in these leads before.. - No troponin drawn. Will high-sensitivity troponin x2. - Will check Echo to assess LV function and wall motion.  Isolated Episodes of Atrial Flutter - Isolated episode of atrial flutter in 2016 with no recurrence. Therefore, not on anticoagulation.  - No evidence of atrial flutter or fibrillation on EKG or  telemetry.  Hypertension - BP very labile with systolic ranging from 95 to 187. Spoke with RN - BP has too be measured on leg and she is unsure how accurate it is. - Not on any antihypertensives at home. - Consider adding Amlodipine if patient does not having any additional low BP readings.  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 Patient was admitted and returned to the OR on 11/12/2020 for resection of distal fistula and conversion to upper arm fistula on the right. - Management per Vascular Surgery.  ESRD On Hemodialysis - Management per Nephrology.  Mental Status Change - Patient noted to have acute mental status change - staring off to space and having delayed responses. RN also reported some jerking of her upper body earlier today. - Brain MRI showed no acute stroke. - Patient on methadone and Oxycodone given chronic pain so Narcan was given with some initial improvement (became more talkative). However, now staring in to space again and not responding consistently. - Would recommend consulting Neurology.  Otherwise, per primary team: - Thrombocytopenia - Anemia of chronic kidney disease - HIV  Risk Assessment/Risk Scores:   CHA2DS2-VASc Score = 5  his indicates a 7.2% annual risk of stroke. The patient's score is based upon: CHF History: 0 HTN History: 0 Diabetes History: 0 Stroke History: 2 Vascular Disease History: 1 Age Score: 1 Gender Score: 1   **Patient not on anticoagulation given only one isolated episode of atrial fibrillation in 2016. No recurrence.**  For questions or updates, please contact [place] Please consult [email or web address] for contact info under    Signed, Darreld Mclean, PA-C  11/18/2020 2:57 PM  Patient seen and examined.  Agree with above documentation. Tina  [name] is a 70 year old female with history of atrial flutter (1 episode in 2016, not on anticoagulation), PAD, TIA, hypertension, ESRD, HIV who we are consulted by Dr. Donzetta Matters for evaluation of chest pain.  She follows with Dr. Harrington Challenger in cardiology.  Previous ischemic evaluation includes cardiac catheterization in 2014 at [facility] which showed no coronary artery disease.  Most recent echocardiogram 67/2094 showed LV systolic function.  She has a right upper extremity fistula and was admitted for fistulogram, which showed chronic pseudoaneurysm, subtotally occluded innominate stent which was treated with angioplasty.  She underwent resection of distal fistula on 9/16 and conversion to upper  arm fistula and has been started on hemodialysis through new fistula.  She was noted to have altered mental status today.  Was found by RN to be staring off into space and having jerking movements.  Brain MRI showed no acute abnormalities.  Also reported possible episode of chest pain.  EKG showed normal sinus rhythm, rate 86, less than 1 mm ST depressions in inferior leads T wave inversions throughout the precordial leads.  Telemetry shows sinus rhythm with rate 60s to 90s.  On exam, patient is altered, stares into space and with rhythmic jerking movements of extremities but will answer some questions, regular rate and rhythm, 2/6 systolic murmur, lungs CTAB, no LE edema.  For her chest pain, recommend trending troponins and checking echocardiogram.  Recommend serial EKGs if further chest pain.  Difficult to evaluate chest pain due to altered mental status.  Would recommend neurology evaluation.  Donato Heinz, MD

## 2020-11-18 NOTE — Consult Note (Signed)
[place] Nurse wound follow up Wound type:Routine NPWT dressing change to RFA. VVS PA-C in to see with this Probation officer. No photo taken today. Measurement:4cm z 2.2cm x 0.6cm with 0.6cm proximal undermining Wound bed: Red, moist Drainage (amount, consistency, odor) small serosanguinous Periwound: intact. Inferior and superior staple lines are protected with Mepitel silicone wound contact layer prior to dressing Dressing procedure/placement/frequency: NPWT dressing removed, PA-C is pleased with reduction in edema and we will not need to replace ACE today. One piece of black foam and two pieces of Mepitel removed. Wound cleansed with NS. One piece of black foam and two pieces of Mepitel placed, secured with drape. Dressing attached to 17mmHg continuous negative pressure and an immediate seal is achieved.  Dressing labeled.  Next dressing change is Wednesday.  Supplies ordered from [employer].  [place] nursing team will follow, and will remain available to this patient, the nursing and medical teams.  Thanks, Maudie Flakes, MSN, RN, [place], Arther Abbott  Pager# [phone]

## 2020-11-18 NOTE — Progress Notes (Signed)
   Progress Note   Date: 11/18/2020  Patient Name: [name]        MRN#: [id number]   Clarification of the diagnosis of pressure ulcer(s):   pressure ulcer stage Sacrum WAS present on admission

## 2020-11-18 NOTE — Progress Notes (Signed)
Pt. Demonstrating change in neuro status. She is staring in space and very delayed to respond. Pt also having jerking in upper body. PA saw patient and MRI of brain ordered. Negative results for stroke. MD saw patient as well and will follow up with neuro. VS are WNL and patient has no s/s of distress. Fuller Canada, RN

## 2020-11-19 ENCOUNTER — Inpatient Hospital Stay (HOSPITAL_COMMUNITY): Payer: Medicare Other

## 2020-11-19 DIAGNOSIS — R4182 Altered mental status, unspecified: Secondary | ICD-10-CM | POA: Diagnosis not present

## 2020-11-19 DIAGNOSIS — R079 Chest pain, unspecified: Secondary | ICD-10-CM | POA: Diagnosis not present

## 2020-11-19 DIAGNOSIS — G9340 Encephalopathy, unspecified: Secondary | ICD-10-CM

## 2020-11-19 DIAGNOSIS — N186 End stage renal disease: Secondary | ICD-10-CM | POA: Diagnosis not present

## 2020-11-19 LAB — BASIC METABOLIC PANEL
Anion gap: 11 (ref 5–15)
BUN: 29 mg/dL — ABNORMAL HIGH (ref 8–23)
CO2: 27 mmol/L (ref 22–32)
Calcium: 10.1 mg/dL (ref 8.9–10.3)
Chloride: 97 mmol/L — ABNORMAL LOW (ref 98–111)
Creatinine, Ser: 8.06 mg/dL — ABNORMAL HIGH (ref 0.44–1.00)
GFR, Estimated: 5 mL/min — ABNORMAL LOW (ref >60)
Glucose, Bld: 77 mg/dL (ref 70–99)
Potassium: 4.2 mmol/L (ref 3.5–5.1)
Sodium: 135 mmol/L (ref 135–145)

## 2020-11-19 LAB — CBC
HCT: 25.4 % — ABNORMAL LOW (ref 36.0–46.0)
Hemoglobin: 7.7 g/dL — ABNORMAL LOW (ref 12.0–15.0)
MCH: 32 pg (ref 26.0–34.0)
MCHC: 30.3 g/dL (ref 30.0–36.0)
MCV: 105.4 fL — ABNORMAL HIGH (ref 80.0–100.0)
Platelets: 243 10*3/uL (ref 150–400)
RBC: 2.41 MIL/uL — ABNORMAL LOW (ref 3.87–5.11)
RDW: 15.3 % (ref 11.5–15.5)
WBC: 3.9 10*3/uL — ABNORMAL LOW (ref 4.0–10.5)
nRBC: 0 % (ref 0.0–0.2)

## 2020-11-19 LAB — ECHOCARDIOGRAM COMPLETE
Height: 65 in
Weight: 1470.91 oz

## 2020-11-19 MED ORDER — ALTEPLASE 2 MG IJ SOLR: 2.0000 mg | Freq: Once | INTRAMUSCULAR | Status: DC | PRN

## 2020-11-19 MED ORDER — SODIUM CHLORIDE 0.9 % IV SOLN: 100.0000 mL | INTRAVENOUS | Status: DC | PRN

## 2020-11-19 MED ORDER — HEPARIN SODIUM (PORCINE) 1000 UNIT/ML DIALYSIS: 1000.0000 [IU] | INTRAMUSCULAR | Status: DC | PRN

## 2020-11-19 MED ORDER — PENTAFLUOROPROP-TETRAFLUOROETH EX AERO: 1.0000 "application " | INHALATION_SPRAY | CUTANEOUS | Status: DC | PRN

## 2020-11-19 MED ORDER — LIDOCAINE-PRILOCAINE 2.5-2.5 % EX CREA: 1.0000 "application " | TOPICAL_CREAM | CUTANEOUS | Status: DC | PRN

## 2020-11-19 MED ORDER — LIDOCAINE HCL (PF) 1 % IJ SOLN: 5.0000 mL | INTRAMUSCULAR | Status: DC | PRN

## 2020-11-19 NOTE — Progress Notes (Signed)
  Echocardiogram 2D Echocardiogram has been performed.  [name] M 11/19/2020, 3:35 PM

## 2020-11-19 NOTE — Progress Notes (Signed)
Pt VSS stable but pt lethargic and non talking. PRN narcan pushed and pt woke up and started talking. HD called and informed of pt condition.  Clyde Canterbury, RN

## 2020-11-19 NOTE — Progress Notes (Addendum)
[facility]  Subjective  - Mental status changes   Objective 124/68 63 (!) 97.5 F (36.4 C) (Oral) 12 98% No intake or output data in the 24 hours ending 11/19/20 0735  Right UE working UE AV fistula Wound vac to suction no drainage in vac.  Vac changes MWF Lungs non labored breathing   Assessment/Planning: POD # 5 70 y.o. 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  Now with vacuum assisted dressing to right forearm.  The arm is healing well as far as granulation in wound bed and warm to touch.    Cardiology consult yesterday pending Echo  Elevated troponin's 44 x 2  EKG showed showed normal sinus rhythm, rate 86 bpm, with T wave inversions/mild ST depression in anterolateral leads. She has had transient ST/T changes in these leads before..  Mental status changes MRI negative for acute stroke Will consult Neurology today for work up. HGB improved 7.7  [name] 11/19/2020 7:35 AM --  Laboratory Lab Results: Recent Labs    11/18/20 0334 11/19/20 0125  WBC 3.2* 3.9*  HGB 7.0* 7.7*  HCT 23.4* 25.4*  PLT 216 243   BMET Recent Labs    11/18/20 0334 11/19/20 0125  NA 133* 135  K 3.9 4.2  CL 96* 97*  CO2 28 27  GLUCOSE 77 77  BUN 21 29*  CREATININE 6.18* 8.06*  CALCIUM 9.7 10.1    COAG Lab Results  Component Value Date   INR 1.14 12/28/2011   INR 1.10 12/13/2010   INR 1.10 01/26/2010   No results found for: PTT  I have independently interviewed and examined patient and agree with PA assessment and plan above. Appreciate care of consulting teams.   Dainelle Hun C. Donzetta Matters, MD [facility] Office: [phone] Pager: [phone]

## 2020-11-19 NOTE — Consult Note (Signed)
Neurology Consultation  Reason for Consult: Altered Mental Status Referring Physician: Dr. Donzetta Matters  CC: No patient complaints at this time; patient states "I feel good"  History is obtained from: Bedside RN, Chart review, patient  HPI: [name] is a 70 y.o. 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 She returned to the OR 11/12/2020 for resection of distal fistula and conversion to upper arm fistula on the right. On 9/19 patient was noted to have altered mental status with staring off, delayed responses, and concern for seizure activity with bilateral upper arm jerking with midline gaze and staring off. She has also been noted to be lethargic and difficult to arouse at times with intermittent upper lip twitching and repetitive speech per nursing staff. MRI brain was ordered without evidence of acute stroke and neurology was consulted for further evaluation. Due to her lethargy, she was given Narcan with initial improvement in mental status but with persistent episodes of staring off and delayed responses.   ROS: A complete ROS was performed and is negative except as noted in the HPI.   Past Medical History:  Diagnosis Date   Anemia    Anxiety    Atrial flutter ([place]) 02/22/2015   C. difficile colitis 10/10/11   Chronic diarrhea    Chronic pain    Depression    ESRD on hemodialysis [facility])    Dialysis [name]   GERD (gastroesophageal reflux disease)    HIV (human immunodeficiency virus infection) ([place])    Hypertension    Insomnia    Intracranial hemorrhage (HCC)    Muscle weakness (generalized)    Pulmonary HTN (HCC)    Tachycardia    TIA (transient ischemic attack)    [facility] do not have  this dx   Traumatic hematoma of right upper arm 02/22/2015   Past Surgical History:  Procedure Laterality Date   A/V FISTULAGRAM N/A 04/17/2016   Procedure: A/V Fistulagram - Right Upper;  Surgeon: Waynetta Sandy, MD;  Location: [place] CV LAB;  Service: Cardiovascular;  Laterality: N/A;   A/V FISTULAGRAM Right 08/28/2019   Procedure: A/V FISTULAGRAM;  Surgeon: Waynetta Sandy, MD;  Location: [place] CV LAB;  Service: Cardiovascular;  Laterality: Right;   ABDOMINAL HYSTERECTOMY     BIOPSY  01/02/2020   Procedure: BIOPSY;  Surgeon: Eloise Harman, DO;  [facility];  Service: Endoscopy;;   BIOPSY  07/11/2020   Procedure: BIOPSY;  Surgeon: Irving Copas., MD;  Location: [place];  Service: Gastroenterology;;   BIOPSY THYROID     CARPAL TUNNEL RELEASE Right 11/16/2019   Procedure: CARPAL TUNNEL RELEASE;  Surgeon: Leanora Cover, MD;  Location: [place];  Service: Orthopedics;  Laterality: Right;   COLONOSCOPY WITH PROPOFOL N/A 01/02/2020   Procedure: COLONOSCOPY WITH PROPOFOL;  Surgeon: Eloise Harman, DO;  [facility];  Service: Endoscopy;  Laterality: N/A;  1:15pm-pt at [facility]   Dialysis Shunts     previous one removed from left arm and now present one in right arm   DIALYSIS/PERMA CATHETER INSERTION Right 04/17/2016   Procedure: dialysis Catheter Insertion central veinous;  Surgeon: Waynetta Sandy, MD;  Location: [place] CV LAB;  Service: Cardiovascular;  Laterality: Right;   ESOPHAGOGASTRODUODENOSCOPY  12/15/2010   [place]: erosive reflux esophagitis, MW tear, hiatal hernia (small), gastritis with no h.pylori, possibly nsaid related   ESOPHAGOGASTRODUODENOSCOPY (EGD) WITH PROPOFOL N/A 07/11/2020    Surgeon: Irving Copas., MD; normal esophagus, 4 cm hiatal hernia, gastritis biopsied (mild chronic gastritis, negative H. pylori), single duodenal polyp (Brunner's gland hyperplasia), s/p duodenal  biopsy benign, duodenal narrowing at the duodenal sweep.   EUS N/A 07/11/2020    Surgeon: Irving Copas., MD;  dilated CBD and common hepatic duct without stones or sludge in the bile duct, stones and biliary sludge in the gallbladder, pancreatic parenchyma with hyperechoic strands, pancreatic duct dilated with prominent sidebranches in the neck and body and tail, no pancreatic mass though visulalization limited, no ampullary lesion, no malignant appearing lymph node   EXCISION OF BREAST BIOPSY Right 05/04/2012   Procedure: EXCISION OF BREAST BIOPSY;  Surgeon: Donato Heinz, MD;  Location: [facility];  Service: General;  Laterality: Right;  Right Excisional Breast Biopsy   FISTULOGRAM Right 03/26/2014   Procedure: Right Arm Fistulogram with Venoplasty Right Subclavian Vein and Inominate Vein. Debridement Fistula Ulcer;  Surgeon: Conrad Lake Almanor West, MD;  Location: [place];  Service: Vascular;  Laterality: Right;   FISTULOGRAM Right 03/29/2017   Procedure: FISTULOGRAM COMPLEX RIGHT ARM with Balloon angioplasty;  Surgeon: Waynetta Sandy, MD;  Location: [place];  Service: Vascular;  Laterality: Right;   HEMOSTASIS CLIP PLACEMENT  07/11/2020   Procedure: HEMOSTASIS CLIP PLACEMENT;  Surgeon: Irving Copas., MD;  Location: [place];  Service: Gastroenterology;;   IR GENERIC HISTORICAL  05/19/2016   IR REMOVAL TUN CV CATH W/O FL 05/19/2016 Markus Daft, MD [facility]-INTER[facility]   LIGATION OF ARTERIOVENOUS  FISTULA Right 11/12/2020   Procedure: LIGATION OF ARTERIOVENOUS  FISTULA WITH EXCISION OF NECROTIC TISSUE;  Surgeon: Waynetta Sandy, MD;  Location: [place];  Service: Vascular;  Laterality: Right;   PERIPHERAL VASCULAR BALLOON ANGIOPLASTY Right 04/17/2016   Procedure: Peripheral Vascular Balloon Angioplasty;  Surgeon: Waynetta Sandy, MD;  Location: [place] CV LAB;  Service: Cardiovascular;  Laterality: Right;  Central segment AV Fistula   PERIPHERAL VASCULAR BALLOON ANGIOPLASTY  Right 03/14/2018   Procedure: PERIPHERAL VASCULAR BALLOON ANGIOPLASTY;  Surgeon: Waynetta Sandy, MD;  Location: [place] CV LAB;  Service: Cardiovascular;  Laterality: Right;  subclavian vein   PERIPHERAL VASCULAR BALLOON ANGIOPLASTY Right 08/28/2019   Procedure: PERIPHERAL VASCULAR BALLOON ANGIOPLASTY;  Surgeon: Waynetta Sandy, MD;  Location: [place] CV LAB;  Service: Cardiovascular;  Laterality: Right;  upper arm   PERIPHERAL VASCULAR BALLOON ANGIOPLASTY Right 11/11/2020   Procedure: PERIPHERAL VASCULAR BALLOON ANGIOPLASTY;  Surgeon: Waynetta Sandy, MD;  Location: [place] CV LAB;  Service: Cardiovascular;  Laterality: Right;   PERIPHERAL VASCULAR INTERVENTION Right 03/14/2018   Procedure: PERIPHERAL VASCULAR INTERVENTION;  Surgeon: Waynetta Sandy, MD;  Location: [place] CV LAB;  Service: Cardiovascular;  Laterality: Right;  subclavian vein   POLYPECTOMY  07/11/2020   Procedure: POLYPECTOMY;  Surgeon: Mansouraty, Telford Nab., MD;  Location: [facility] ENDOSCOPY;  Service: Gastroenterology;;   REVISON OF ARTERIOVENOUS FISTULA Right 11/12/2020   Procedure: CREATION OF RIGHT ARM ARTERIOVENOUS FISTULA;  Surgeon: Waynetta Sandy, MD;  Location: [place];  Service: Vascular;  Laterality: Right;   SHUNTOGRAM Right 10/19/2013   Procedure: FISTULOGRAM;  Surgeon: Conrad [place], MD;  Location: [facility] CATH LAB;  Service: Cardiovascular;  Laterality: Right;   TONSILLECTOMY     VISCERAL VENOGRAPHY Right 03/14/2018   Procedure: CENTRAL VENOGRAPHY;  Surgeon: Waynetta Sandy, MD;  Location: [place] CV LAB;  Service: Cardiovascular;  Laterality: Right;  upper ext fistula   Family History  Problem Relation Age of Onset   Anesthesia problems Neg Hx    Hypotension Neg Hx    Malignant hyperthermia Neg Hx    Pseudochol deficiency Neg Hx    Colon cancer Neg Hx        Unsure of parents who both died when she was a baby   Social History:   reports that  she quit smoking about 37 years ago. Her smoking use included cigarettes. She has never used smokeless tobacco. She reports that she does not drink alcohol and does not use drugs.  Medications  Current Facility-Administered Medications:    0.9 %  sodium chloride infusion, 250 mL, Intravenous, PRN, Waynetta Sandy, MD   acetaminophen (TYLENOL) tablet 650 mg, 650 mg, Oral, Q4H PRN, Waynetta Sandy, MD, 650 mg at 11/19/20 1311   ascorbic acid (VITAMIN C) tablet 500 mg, 500 mg, Oral, Q12H, Waynetta Sandy, MD, 500 mg at 11/18/20 0745   calcitRIOL (ROCALTROL) capsule 1.25 mcg, 1.25 mcg, Oral, Q dialysis, Gean Quint, MD, 1.25 mcg at 11/16/20 1350   calcium acetate (PHOSLO) capsule 1,334 mg, 1,334 mg, Oral, TID WC, Waynetta Sandy, MD, 1,334 mg at 11/18/20 1124   calcium acetate (PHOSLO) capsule 667 mg, 667 mg, Oral, PRN, Waynetta Sandy, MD   Chlorhexidine Gluconate Cloth 2 % PADS 6 each, 6 each, Topical, Q0600, Edrick Oh, MD, 6 each at 11/18/20 0746   cinacalcet (SENSIPAR) tablet 60 mg, 60 mg, Oral, Q T,Th,Sa-HD, Gean Quint, MD, 60 mg at 11/16/20 1350   clopidogrel (PLAVIX) tablet 75 mg, 75 mg, Oral, Q breakfast, Waynetta Sandy, MD, 75 mg at 11/19/20 1310   Darbepoetin Alfa (ARANESP) injection 100 mcg, 100 mcg, Intravenous, Q Thu-HD, Gean Quint, MD, 100 mcg at 11/14/20 1155   dolutegravir (TIVICAY) tablet 50 mg, 50 mg, Oral, q1600, Waynetta Sandy, MD, 50 mg at 11/19/20 1557   heparin injection 5,000 Units, 5,000 Units, Subcutaneous, Q8H, Waynetta Sandy, MD, 5,000 Units at 11/19/20 1312   hydrALAZINE (APRESOLINE) injection 5 mg, 5 mg, Intravenous, Q20 Min PRN, Waynetta Sandy, MD   labetalol (NORMODYNE) injection 10 mg, 10 mg, Intravenous, Q10 min PRN, Waynetta Sandy, MD   lamiVUDine (EPIVIR) 10 MG/ML solution 50 mg, 50 mg, Oral, QAC breakfast, Waynetta Sandy, MD, 50 mg at 11/19/20  0944   melatonin tablet 3 mg, 3 mg, Oral, Q24H, Waynetta Sandy, MD, 3 mg at 11/17/20 2011   mirtazapine (REMERON) tablet 7.5 mg, 7.5 mg, Oral, QHS, Waynetta Sandy, MD, 7.5 mg at 11/17/20 2014   multivitamin with minerals tablet 1 tablet, 1 tablet, Oral, Q24H, Waynetta Sandy, MD, 1 tablet at 11/17/20 2014   naloxone [facility]) injection 0.4 mg, 0.4 mg, Intravenous, PRN, Laurence Slate M, PA-C, 0.4 mg at 11/19/20 9163   nitroGLYCERIN (NITROSTAT) SL tablet 0.4 mg, 0.4 mg, Sublingual, Q5 Min x 3 PRN, Waynetta Sandy, MD   ondansetron [facility]) injection 4 mg, 4 mg, Intravenous, Q6H PRN, Waynetta Sandy, MD   ondansetron [facility]) tablet 4 mg, 4 mg, Oral, Q8H PRN, Waynetta Sandy, MD   pantoprazole (PROTONIX) EC tablet 40 mg, 40 mg, Oral, BID AC, Waynetta Sandy, MD, 40 mg at 11/19/20 1557   senna (SENOKOT) tablet 17.2 mg, 2 tablet, Oral, Q24H, Waynetta Sandy, MD, 17.2 mg at 11/17/20  2012   sodium chloride flush (NS) 0.9 % injection 3 mL, 3 mL, Intravenous, Q12H, Waynetta Sandy, MD, 3 mL at 11/19/20 0943   sodium chloride flush (NS) 0.9 % injection 3 mL, 3 mL, Intravenous, PRN, Waynetta Sandy, MD   venlafaxine XR [facility]) 24 hr capsule 150 mg, 150 mg, Oral, Q breakfast, Waynetta Sandy, MD, 150 mg at 11/19/20 1311   zidovudine (RETROVIR) capsule 100 mg, 100 mg, Oral, 3 times per day, Waynetta Sandy, MD, 100 mg at 11/19/20 1316  Facility-Administered Medications Ordered in Other Encounters:    0.9 %  sodium chloride infusion (Manually program via Guardrails IV Fluids), 250 mL, Intravenous, Once, [place], Randi L, NP-C   acetaminophen (TYLENOL) tablet 650 mg, 650 mg, Oral, Once, Lockamy, Randi L, NP-C   diphenhydrAMINE (BENADRYL) capsule 25 mg, 25 mg, Oral, Once, Lockamy, Randi L, NP-C   sodium chloride flush (NS) 0.9 % injection 10 mL, 10 mL, Intracatheter, PRN, Lockamy, Randi L,  NP-C  Exam: Current vital signs: BP (!) 159/80 (BP Location: Right Arm)   Pulse 88   Temp 98.5 F (36.9 C) (Oral)   Resp 20   Ht 5\' 5"  (1.651 m)   Wt 41.7 kg   LMP  (LMP Unknown)   SpO2 98%   BMI 15.30 kg/m  Vital signs in last 24 hours: Temp:  [97.5 F (36.4 C)-98.6 F (37 C)] 98.5 F (36.9 C) (09/20 1557) Pulse Rate:  [63-98] 88 (09/20 1557) Resp:  [11-28] 20 (09/20 1557) BP: (84-198)/(41-115) 159/80 (09/20 1557) SpO2:  [96 %-100 %] 98 % (09/20 1557) Weight:  [41.7 kg-43.3 kg] 41.7 kg (09/20 1217)  GENERAL: Awake, alert, in no acute distress Psych: Affect appropriate for situation, patient is calm and cooperative with examination Head: Normocephalic and atraumatic, without obvious abnormality EENT: Normal conjunctivae, dry mucous membranes, no OP obstruction LUNGS: Normal respiratory effort. Non-labored breathing on room air CV: Regular rate and rhythm on telemetry ABDOMEN: Soft, non-tender, non-distended Extremities: right upper extremity with staples that are clean, dry, and intact and with swelling from recent procedures  NEURO:  Mental Status: Awake, alert, and oriented to person and situation. She is able to state the month correctly but states that the year is 2020. She states that she is 70 years old repetitively. She states that she is in a rehab facility as well. When reminded that she is in the hospital she is able to state that she is here because of her fistula infection.  Speech/Language: speech is dysarthric but she is edentulous.  She is able to follow simple commands without difficulty. She does sometimes perseverate but is able to answer further examiner questions without difficulty.   Naming, fluency, and comprehension intact.  No neglect is noted. Cranial Nerves:  II: PERRL 4 mm/brisk. Visual fields full.  III, IV, VI: EOMI without gaze preference or nystagmus V: Sensation is intact to light touch and symmetrical to face. VII: Face is symmetric  resting and smiling.  VIII: Hearing is intact to voice IX, X: Phonation normal.  XI: Normal sternocleidomastoid and trapezius muscle strength XII: Tongue protrudes midline    Motor: Bilateral lower extremities are weak with 4-/5 strength.  Right upper extremity is weaker than the left due to pain and swelling from recent fistula revisions on the right.  Left upper extremity is able to withstand antigravity position without vertical drift. She does have occasional twitching of the left hand that is not rhythmic.  Tone is normal. Bulk is  decreased throughout.  Sensation: Intact to light touch bilaterally in all four extremities.  Coordination: Unable to assess due to patient weakness of BLE and pain of RUE Gait: Deferred  Labs I have reviewed labs in epic and the results pertinent to this consultation are: CBC    Component Value Date/Time   WBC 3.9 (L) 11/19/2020 0125   RBC 2.41 (L) 11/19/2020 0125   HGB 7.7 (L) 11/19/2020 0125   HCT 25.4 (L) 11/19/2020 0125   PLT 243 11/19/2020 0125   MCV 105.4 (H) 11/19/2020 0125   MCH 32.0 11/19/2020 0125   MCHC 30.3 11/19/2020 0125   RDW 15.3 11/19/2020 0125   LYMPHSABS 0.6 (L) 10/18/2019 0941   MONOABS 0.3 10/18/2019 0941   EOSABS 0.1 10/18/2019 0941   BASOSABS 0.0 10/18/2019 0941   CMP     Component Value Date/Time   NA 135 11/19/2020 0125   K 4.2 11/19/2020 0125   CL 97 (L) 11/19/2020 0125   CO2 27 11/19/2020 0125   GLUCOSE 77 11/19/2020 0125   BUN 29 (H) 11/19/2020 0125   CREATININE 8.06 (H) 11/19/2020 0125   CREATININE 5.66 (H) 12/22/2019 1117   CALCIUM 10.1 11/19/2020 0125   CALCIUM 9.0 03/12/2011 0527   PROT 7.9 07/11/2020 0835   ALBUMIN 2.5 (L) 11/18/2020 0334   AST 27 07/11/2020 0835   ALT 6 07/11/2020 0835   ALKPHOS 82 07/11/2020 0835   BILITOT 0.8 07/11/2020 0835   GFRNONAA 5 (L) 11/19/2020 0125   GFRNONAA 5 (L) 02/12/2015 1000   GFRAA 9 (L) 10/22/2019 0105   GFRAA 5 (L) 02/12/2015 1000   Lipid Panel      Component Value Date/Time   CHOL 134 12/22/2019 1117   TRIG 94 12/22/2019 1117   HDL 50 12/22/2019 1117   CHOLHDL 2.7 12/22/2019 1117   VLDL 27 07/23/2015 1652   LDLCALC 66 12/22/2019 1117   Imaging I have reviewed the images obtained:  MRI examination of the brain 9/20: No evidence of recent infarction, hemorrhage, or mass Moderate chronic microvascular ischemic changes.  Assessment: 70 y.o. female with admission for forearm fistula necrotic pseudoaneurysm with concern for infection and conversion to upper arm fistula and subsequent successful HD.  - Examination reveals patient with some perseveration and confusion but who is awake and follows commands without difficulty.  - MRI brain imaging is without acute intracranial process. - There is reported concern for decreased level of consciousness and difficult arousal with concern for potential seizure activity with midline gaze, "staring off" episodes, and upper extremity rhythmic jerking.  - Presentation of encephalopathy may be secondary to toxic-metabolic or infectious etiology, seizures with post-ictal state, dialysis disequilibrium, versus delirium with prolonged hospital stay and multiple procedures or a combination of those previously listed. Will obtain EEG for further evaluation.   Impression: Acute Encephalopathy; etiology likely multifactorial  Recommendations: -Routine EEG -Seizure precautions -Frequent reorientation -Minimize sleep disruptions -Try to minimize deliriogenic medications as much as possible (J Am Geriatr Soc. 2012 Apr;60(4):616-31): benzodiazepines, anticholinergics, diphenhydramine, antihistamines, narcotics, Ambien/Lunesta/Sonata etc. -Neurology will follow up on EEG results   Anibal Henderson, AGAC-NP [facility] Pager: [phone]  Attending attestation:  Patient seen, examined, labs,vitals and notes reviewed. Discussed plan with Anibal Henderson, NP and agree with assessment and plan  as documented above. I have independently reviewed the chart, obtained history, review of systems and examined the patient.  Electronically signed by:  Lynnae Sandhoff, MD Page: [phone] 11/19/2020, 7:28 PM

## 2020-11-19 NOTE — Progress Notes (Signed)
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  Patient receives dialysis on Tuesday Thursday Saturday basis.  Next dialysis treatment will be 11/19/2020.  Usual dialysis centers dialysis [facility]  Blood pressure 140 967 pulse 74 temperature 98.6 O2 sats 100% room air  Sodium 135 potassium 4.2 chloride 97 CO2 27 BUN 29 creatinine 8 glucose 77 calcium 10.1 hemoglobin 7.7  Confusion improved after Narcan       Objective:  Vital signs in last 24 hours:  Temp:  [97.5 F (36.4 C)-98.6 F (37 C)] 98.6 F (37 C) (09/20 0906) Pulse Rate:  [63-98] 72 (09/20 1200) Resp:  [11-28] 27 (09/20 1200) BP: (84-198)/(41-115) 148/71 (09/20 1200) SpO2:  [96 %-100 %] 98 % (09/20 0906) Weight:  [41.7 kg-43.3 kg] 43.3 kg (09/20 0906)  Weight change:  Filed Weights   11/17/20 0424 11/19/20 0340 11/19/20 0906  Weight: 44.6 kg 41.7 kg 43.3 kg    Intake/Output: No intake/output data recorded.   Intake/Output this shift:  No intake/output data recorded.  Gen:nad CVS:rrr Resp:normal wob ONG:EXBM, nt Ext: RUE swelling Neuro: awake, alert HD access: RUE AVF, woundvac+dressing in place   Basic Metabolic Panel: Recent Labs  Lab 11/13/20 0257 11/14/20 1000 11/18/20 0334 11/19/20 0125  NA 136 134* 133* 135  K 6.0* 4.4 3.9 4.2  CL 100 95* 96* 97*  CO2 24 29 28 27   GLUCOSE 109* 81 77 77  BUN 59* 24* 21 29*  CREATININE 13.20* 6.87* 6.18* 8.06*  CALCIUM 9.0 9.5 9.7 10.1  PHOS  --  3.7 2.9  --      Liver Function Tests: Recent Labs  Lab 11/14/20 1000 11/18/20 0334  ALBUMIN 2.9* 2.5*    No results for input(s): LIPASE, AMYLASE in the last 168 hours. No results for input(s): AMMONIA in the last 168 hours.  CBC: Recent Labs  Lab 11/13/20 0257 11/14/20 0921 11/18/20 0334 11/19/20 0125  WBC 3.3* 3.9*  3.2* 3.9*  HGB 7.6* 7.4* 7.0* 7.7*  HCT 25.9* 25.2* 23.4* 25.4*  MCV 107.5* 107.2* 108.3* 105.4*  PLT 132* 161 216 243     Cardiac Enzymes: No results for input(s): CKTOTAL, CKMB, CKMBINDEX, TROPONINI in the last 168 hours.  BNP: Invalid input(s): POCBNP  CBG: Recent Labs  Lab 11/15/20 2124 11/16/20 0611 11/16/20 1448 11/17/20 0628 11/18/20 0850  GLUCAP 97 84 82 74 94     Microbiology: Results for orders placed or performed during the hospital encounter of 11/11/20  SARS CORONAVIRUS 2 (TAT 6-24 HRS) Nasopharyngeal Nasopharyngeal Swab     Status: None   Collection Time: 11/12/20  3:06 AM   Specimen: Nasopharyngeal Swab  Result Value Ref Range Status   SARS Coronavirus 2 NEGATIVE NEGATIVE Final    Comment: (NOTE) SARS-CoV-2 target nucleic acids are NOT DETECTED.  The SARS-CoV-2 RNA is generally detectable in upper and lower respiratory specimens during the acute phase of infection. Negative results do not preclude SARS-CoV-2 infection, do not rule out co-infections with other pathogens, and should not be used as the sole basis for treatment or other patient management decisions. Negative results must be combined with clinical observations, patient history, and epidemiological information. The expected result is Negative.  Fact Sheet for Patients: SugarRoll.be  Fact Sheet for Healthcare Providers: https://www.woods-mathews.com/  This test is not yet approved or cleared by the [place] FDA and  has been authorized for detection and/or diagnosis of SARS-CoV-2 by FDA under an Emergency Use Authorization (EUA). This EUA will remain  in effect (meaning this test can be used) for the duration of the COVID-19 declaration under Se ction 564(b)(1) of the Act, 21 U.S.C. section 360bbb-3(b)(1), unless the authorization is terminated or revoked sooner.  Performed at [facility], [place] [address], [place],  [place] [address]   Surgical pcr screen     Status: Abnormal   Collection Time: 11/12/20  4:52 AM   Specimen: Nasal Mucosa; Nasal Swab  Result Value Ref Range Status   MRSA, PCR POSITIVE (A) NEGATIVE Final    Comment: RESULT CALLED TO, READ BACK BY AND VERIFIED WITH: RN KERI DAVIDSON 11/12/20@6 :32 BY TW    Staphylococcus aureus POSITIVE (A) NEGATIVE Final    Comment: RESULT CALLED TO, READ BACK BY AND VERIFIED WITH: RN KERI DAVIDSON 11/12/20@6 :32 BY TW (NOTE) The Xpert SA Assay (FDA approved for NASAL specimens in patients 67 years of age and older), is one component of a comprehensive surveillance program. It is not intended to diagnose infection nor to guide or monitor treatment. Performed at [facility], [place] [address] [place], [place] [address]   SARS CORONAVIRUS 2 (TAT 6-24 HRS) Nasopharyngeal Nasopharyngeal Swab     Status: None   Collection Time: 11/15/20  5:20 PM   Specimen: Nasopharyngeal Swab  Result Value Ref Range Status   SARS Coronavirus 2 NEGATIVE NEGATIVE Final    Comment: (NOTE) SARS-CoV-2 target nucleic acids are NOT DETECTED.  The SARS-CoV-2 RNA is generally detectable in upper and lower respiratory specimens during the acute phase of infection. Negative results do not preclude SARS-CoV-2 infection, do not rule out co-infections with other pathogens, and should not be used as the sole basis for treatment or other patient management decisions. Negative results must be combined with clinical observations, patient history, and epidemiological information. The expected result is Negative.  Fact Sheet for Patients: SugarRoll.be  Fact Sheet for Healthcare Providers: https://www.woods-mathews.com/  This test is not yet approved or cleared by the [place] FDA and  has been authorized for detection and/or diagnosis of SARS-CoV-2 by FDA under an Emergency Use Authorization (EUA). This EUA will remain  in effect  (meaning this test can be used) for the duration of the COVID-19 declaration under Se ction 564(b)(1) of the Act, 21 U.S.C. section 360bbb-3(b)(1), unless the authorization is terminated or revoked sooner.  Performed at [facility], [place] [address], [place], [place] [address]     Coagulation Studies: No results for input(s): LABPROT, INR in the last 72 hours.  Urinalysis: No results for input(s): COLORURINE, LABSPEC, PHURINE, GLUCOSEU, HGBUR, BILIRUBINUR, KETONESUR, PROTEINUR, UROBILINOGEN, NITRITE, LEUKOCYTESUR in the last 72 hours.  Invalid input(s): APPERANCEUR    Imaging: MR BRAIN WO CONTRAST  Result Date: 11/18/2020 CLINICAL DATA:  Mental status change, unknown cause EXAM: MRI HEAD WITHOUT CONTRAST TECHNIQUE: Multiplanar, multiecho pulse sequences of the brain and surrounding structures were obtained without intravenous contrast. COMPARISON:  None. FINDINGS: Brain: There is no acute infarction or intracranial hemorrhage. There is no intracranial mass, mass effect, or edema. There is no hydrocephalus or extra-axial fluid collection. Prominence of the ventricles and sulci reflects mild generalized parenchymal volume loss. Patchy and confluent areas of T2 hyperintensity in the supratentorial white matter are nonspecific but may reflect moderate chronic microvascular ischemic changes. Vascular: Major vessel flow voids at the skull base are preserved. Skull and upper cervical spine: Normal marrow  signal is preserved. Sinuses/Orbits: Paranasal sinuses are aerated. Orbits are unremarkable. Other: Sella is unremarkable.  Mastoid air cells are clear. IMPRESSION: No evidence of recent infarction, hemorrhage, or mass. Moderate chronic microvascular ischemic changes. Electronically Signed   By: Macy Mis M.D.   On: 11/18/2020 09:49     Medications:    sodium chloride     sodium chloride     sodium chloride      vitamin C  500 mg Oral Q12H   calcium acetate  1,334 mg Oral TID WC    Chlorhexidine Gluconate Cloth  6 each Topical Q0600   cinacalcet  60 mg Oral Q T,Th,Sa-HD   clopidogrel  75 mg Oral Q breakfast   darbepoetin (ARANESP) injection - DIALYSIS  100 mcg Intravenous Q Thu-HD   dolutegravir  50 mg Oral q1600   heparin  5,000 Units Subcutaneous Q8H   lamiVUDine  50 mg Oral QAC breakfast   melatonin  3 mg Oral Q24H   mirtazapine  7.5 mg Oral QHS   multivitamin with minerals  1 tablet Oral Q24H   pantoprazole  40 mg Oral BID AC   senna  2 tablet Oral Q24H   sodium chloride flush  3 mL Intravenous Q12H   venlafaxine XR  150 mg Oral Q breakfast   zidovudine  100 mg Oral 3 times per day   sodium chloride, sodium chloride, sodium chloride, acetaminophen, alteplase, calcitRIOL, calcium acetate, heparin, hydrALAZINE, labetalol, lidocaine (PF), lidocaine-prilocaine, naLOXone (NARCAN)  injection, nitroGLYCERIN, ondansetron (ZOFRAN) IV, ondansetron, pentafluoroprop-tetrafluoroeth, sodium chloride flush  Assessment/ Plan:   ESRD:  -outpatient orders: [name], TTS, [place] 300, 350/500, 2k, 2.5cal, 15g needles, edw 46.5kg, receives hep 1k units and 500 units/hr -will maintain TTS schedule, next dialysis to be 11/19/2020 - Dose all meds for creatinine clearance < 10 ml/min  - Unless absolutely necessary, no MRIs with gadolinium.   Volume/ hypertension: EDW 46.5kg. UF as tolerated Anemia of Chronic Kidney Disease: Hemoglobin 9.3  on presentation; down to 7.6 as of 9/15 (blood loss from surgery?), receives epogen 3600units TIW. Aranesp 168mcg starting 9/15. Transfuse prn, monitor cbc  Secondary Hyperparathyroidism/Hyperphosphatemia: Continue with binders; receives cinacalcet 60mg  and calcitriol 1.71mcg TIW  Vascular access: Right upper extremity fistula with good bruit, slightly pulsatile s/p rt cimino resection + ligation prox/distal + collaterals.. Have been able to cannulate Dispo: pending d/c back to SNF, PT/OT  LOS: [place] @TODAY @12 :15 PM

## 2020-11-19 NOTE — Care Management Important Message (Signed)
Important Message  Patient Details  Name: [name] MRN: [id number] Date of Birth: 08/03/1950   Medicare Important Message Given:  Yes     [name] [address] 11/19/2020, 11:43 AM

## 2020-11-19 NOTE — Progress Notes (Signed)
Progress Note  Patient Name: [name] Date of Encounter: 11/19/2020  [facility] Cardiologist: Dorris Carnes, MD   Subjective   Remains confused, unable to say if having chest pain  Inpatient Medications    Scheduled Meds:  vitamin C  500 mg Oral Q12H   calcium acetate  1,334 mg Oral TID WC   Chlorhexidine Gluconate Cloth  6 each Topical Q0600   cinacalcet  60 mg Oral Q T,Th,Sa-HD   clopidogrel  75 mg Oral Q breakfast   darbepoetin (ARANESP) injection - DIALYSIS  100 mcg Intravenous Q Thu-HD   dolutegravir  50 mg Oral q1600   heparin  5,000 Units Subcutaneous Q8H   lamiVUDine  50 mg Oral QAC breakfast   melatonin  3 mg Oral Q24H   mirtazapine  7.5 mg Oral QHS   multivitamin with minerals  1 tablet Oral Q24H   pantoprazole  40 mg Oral BID AC   senna  2 tablet Oral Q24H   sodium chloride flush  3 mL Intravenous Q12H   venlafaxine XR  150 mg Oral Q breakfast   zidovudine  100 mg Oral 3 times per day   Continuous Infusions:  sodium chloride     sodium chloride     sodium chloride     PRN Meds: sodium chloride, sodium chloride, sodium chloride, acetaminophen, alteplase, calcitRIOL, calcium acetate, heparin, hydrALAZINE, labetalol, lidocaine (PF), lidocaine-prilocaine, naLOXone (NARCAN)  injection, nitroGLYCERIN, ondansetron (ZOFRAN) IV, ondansetron, pentafluoroprop-tetrafluoroeth, sodium chloride flush   Vital Signs    Vitals:   11/19/20 0913 11/19/20 0930 11/19/20 1000 11/19/20 1030  BP: (!) 180/115 (!) 198/87 (!) 173/91 (!) 167/77  Pulse:      Resp: (!) 25 18 19  (!) 27  Temp:      TempSrc:      SpO2:      Weight:      Height:       No intake or output data in the 24 hours ending 11/19/20 1102 Last 3 Weights 11/19/2020 11/19/2020 11/17/2020  Weight (lbs) 95 lb 7.4 oz 91 lb 14.9 oz 98 lb 5.2 oz  Weight (kg) 43.3 kg 41.7 kg 44.6 kg      Telemetry    NSR - Personally Reviewed  ECG    NO new EKG - Personally Reviewed  Physical Exam   GEN: Confused,  not answering questions Neck: No JVD Cardiac: RRR, no murmurs, rubs, or gallops.  Respiratory: Clear to auscultation bilaterally. GI: Soft, nontender, non-distended  MS: No edema; No deformity. Neuro:  Confused Psych: Unable to assess  Labs    High Sensitivity Troponin:   Recent Labs  Lab 11/18/20 1505 11/18/20 1634  TROPONINIHS 44* 44*     Chemistry Recent Labs  Lab 11/14/20 1000 11/18/20 0334 11/19/20 0125  NA 134* 133* 135  K 4.4 3.9 4.2  CL 95* 96* 97*  CO2 29 28 27   GLUCOSE 81 77 77  BUN 24* 21 29*  CREATININE 6.87* 6.18* 8.06*  CALCIUM 9.5 9.7 10.1  ALBUMIN 2.9* 2.5*  --   GFRNONAA 6* 7* 5*  ANIONGAP 10 9 11     Lipids No results for input(s): CHOL, TRIG, HDL, LABVLDL, LDLCALC, CHOLHDL in the last 168 hours.  Hematology Recent Labs  Lab 11/14/20 0921 11/18/20 0334 11/19/20 0125  WBC 3.9* 3.2* 3.9*  RBC 2.35* 2.16* 2.41*  HGB 7.4* 7.0* 7.7*  HCT 25.2* 23.4* 25.4*  MCV 107.2* 108.3* 105.4*  MCH 31.5 32.4 32.0  MCHC 29.4* 29.9* 30.3  RDW 15.1 15.3 15.3  PLT 161 216 243   Thyroid No results for input(s): TSH, FREET4 in the last 168 hours.  BNPNo results for input(s): BNP, PROBNP in the last 168 hours.  DDimer No results for input(s): DDIMER in the last 168 hours.   Radiology    MR BRAIN WO CONTRAST  Result Date: 11/18/2020 CLINICAL DATA:  Mental status change, unknown cause EXAM: MRI HEAD WITHOUT CONTRAST TECHNIQUE: Multiplanar, multiecho pulse sequences of the brain and surrounding structures were obtained without intravenous contrast. COMPARISON:  None. FINDINGS: Brain: There is no acute infarction or intracranial hemorrhage. There is no intracranial mass, mass effect, or edema. There is no hydrocephalus or extra-axial fluid collection. Prominence of the ventricles and sulci reflects mild generalized parenchymal volume loss. Patchy and confluent areas of T2 hyperintensity in the supratentorial white matter are nonspecific but may reflect moderate  chronic microvascular ischemic changes. Vascular: Major vessel flow voids at the skull base are preserved. Skull and upper cervical spine: Normal marrow signal is preserved. Sinuses/Orbits: Paranasal sinuses are aerated. Orbits are unremarkable. Other: Sella is unremarkable.  Mastoid air cells are clear. IMPRESSION: No evidence of recent infarction, hemorrhage, or mass. Moderate chronic microvascular ischemic changes. Electronically Signed   By: Macy Mis M.D.   On: 11/18/2020 09:49    Cardiac Studies     Patient Profile     70 y.o. female with a history of isolated episode of atrial flutter in 2016 not on anticoagulation, prior TIA, PAD with prior stenting to right innominate artery, hypertension, ESRD on hemodialysis, thrombocytopenia, GERD, HIV, and chronic pain who is being seen for the evaluation of chest pain   Assessment & Plan     Questionable Chest Pain - Patient had questionable episode of chest pain yesterday. However, she has had mental status changes so difficult to get reliable history.  - EKG showed showed normal sinus rhythm, rate 86 bpm, with T wave inversions/mild ST depression in anterolateral leads. She has had transient ST/T changes in these leads before.. - Troponin 44>44, not consistent with ACS - Will check Echo to assess LV function    Isolated Episodes of Atrial Flutter - Isolated episode of atrial flutter in 2016 with no recurrence. Therefore, not on anticoagulation.  - No evidence of atrial flutter or fibrillation on EKG or telemetry.   Hypertension - BP very labile with systolic ranging from 95 to 187. Spoke with RN - BP has too be measured on leg and she is unsure how accurate it is. - Not on any antihypertensives at home. - Consider adding Amlodipine if patient does not having any additional low BP readings.   PAD Necrotic Pseudoaneurysm - Underwent fistulogram on 11/11/2020  found to have a necrotic pseudoaneurysm in the forearm subtotally occluded  innominate stent which was treated with drug-coated balloon angioplasty. Patient was admitted and returned to the OR on 11/12/2020 for resection of distal fistula and conversion to upper arm fistula on the right. - Management per Vascular Surgery.   ESRD On Hemodialysis - Management per Nephrology.   Mental Status Change - Patient noted to have acute mental status change - staring off to space and having delayed responses. Also had some jerking of her upper body e - Brain MRI showed no acute stroke. - Patient on methadone and Oxycodone given chronic pain so Narcan was given with some initial improvement (became more talkative). However, now staring in to space again and not responding consistently. - Neurology consulted   Otherwise, per  primary team: - Thrombocytopenia - Anemia of chronic kidney disease - HIV   For questions or updates, please contact [place] Please consult [email or web address] for contact info under        Signed, Donato Heinz, MD  11/19/2020, 11:02 AM

## 2020-11-20 ENCOUNTER — Inpatient Hospital Stay (HOSPITAL_COMMUNITY): Payer: Medicare Other

## 2020-11-20 ENCOUNTER — Other Ambulatory Visit (HOSPITAL_COMMUNITY): Payer: Medicare Other

## 2020-11-20 DIAGNOSIS — G928 Other toxic encephalopathy: Secondary | ICD-10-CM

## 2020-11-20 DIAGNOSIS — N186 End stage renal disease: Secondary | ICD-10-CM | POA: Diagnosis not present

## 2020-11-20 DIAGNOSIS — E43 Unspecified severe protein-calorie malnutrition: Secondary | ICD-10-CM | POA: Insufficient documentation

## 2020-11-20 DIAGNOSIS — R4182 Altered mental status, unspecified: Secondary | ICD-10-CM

## 2020-11-20 DIAGNOSIS — R079 Chest pain, unspecified: Secondary | ICD-10-CM | POA: Diagnosis not present

## 2020-11-20 LAB — GLUCOSE, CAPILLARY: Glucose-Capillary: 95 mg/dL (ref 70–99)

## 2020-11-20 MED ORDER — RENA-VITE PO TABS
1.0000 | ORAL_TABLET | Freq: Every day | ORAL | Status: DC
  Administered 2020-11-20 – 2020-11-26 (×7): 1 via ORAL
  Filled 2020-11-20 (×8): qty 1

## 2020-11-20 MED ORDER — HEPARIN BOLUS VIA INFUSION
2500.0000 [IU] | Freq: Once | INTRAVENOUS | Status: AC
  Administered 2020-11-20: 2500 [IU] via INTRAVENOUS
  Filled 2020-11-20: qty 2500

## 2020-11-20 MED ORDER — METOPROLOL TARTRATE 25 MG PO TABS
25.0000 mg | ORAL_TABLET | Freq: Two times a day (BID) | ORAL | Status: DC
  Administered 2020-11-20 – 2020-11-27 (×14): 25 mg via ORAL
  Filled 2020-11-20 (×14): qty 1

## 2020-11-20 MED ORDER — CHLORHEXIDINE GLUCONATE CLOTH 2 % EX PADS: 6.0000 | MEDICATED_PAD | Freq: Every day | CUTANEOUS | Status: DC

## 2020-11-20 MED ORDER — HEPARIN (PORCINE) 25000 UT/250ML-% IV SOLN
700.0000 [IU]/h | INTRAVENOUS | Status: AC
  Administered 2020-11-20: 700 [IU]/h via INTRAVENOUS
  Filled 2020-11-20 (×2): qty 250

## 2020-11-20 MED ORDER — METOPROLOL TARTRATE 5 MG/5ML IV SOLN: 5.0000 mg | Freq: Four times a day (QID) | INTRAVENOUS | Status: DC | PRN

## 2020-11-20 MED ORDER — ENSURE ENLIVE PO LIQD
237.0000 mL | Freq: Two times a day (BID) | ORAL | Status: DC
  Administered 2020-11-20 – 2020-11-24 (×6): 237 mL via ORAL

## 2020-11-20 NOTE — Evaluation (Addendum)
Clinical/Bedside Swallow Evaluation Patient Details  Name: [name] MRN: [id number] Date of Birth: 1950/08/02  Today's Date: 11/20/2020 Time: SLP Start Time (ACUTE ONLY): 1034 SLP Stop Time (ACUTE ONLY): 1055 SLP Time Calculation (min) (ACUTE ONLY): 21 min  Past Medical History:  Past Medical History:  Diagnosis Date   Anemia    Anxiety    Atrial flutter ([place]) 02/22/2015   C. difficile colitis 10/10/11   Chronic diarrhea    Chronic pain    Depression    ESRD on hemodialysis [facility])    Dialysis [name]   GERD (gastroesophageal reflux disease)    HIV (human immunodeficiency virus infection) ([place])    Hypertension    Insomnia    Intracranial hemorrhage (HCC)    Muscle weakness (generalized)    Pulmonary HTN (HCC)    Tachycardia    TIA (transient ischemic attack)    [facility] do not have this dx   Traumatic hematoma of right upper arm 02/22/2015   Past Surgical History:  Past Surgical History:  Procedure Laterality Date   A/V FISTULAGRAM N/A 04/17/2016   Procedure: A/V Fistulagram - Right Upper;  Surgeon: Waynetta Sandy, MD;  Location: [place] CV LAB;  Service: Cardiovascular;  Laterality: N/A;   A/V FISTULAGRAM Right 08/28/2019   Procedure: A/V FISTULAGRAM;  Surgeon: Waynetta Sandy, MD;  Location: [place] CV [facility];  Service: Cardiovascular;  Laterality: Right;   ABDOMINAL HYSTERECTOMY     BIOPSY  01/02/2020   Procedure: BIOPSY;  Surgeon: Eloise Harman, DO;  [facility];  Service: Endoscopy;;   BIOPSY  07/11/2020   Procedure: BIOPSY;  Surgeon: Irving Copas., MD;  Location: [place];  Service: Gastroenterology;;   BIOPSY THYROID     CARPAL TUNNEL RELEASE Right 11/16/2019   Procedure: CARPAL TUNNEL RELEASE;  Surgeon: Leanora Cover, MD;  Location: [place];  Service: Orthopedics;  Laterality: Right;   COLONOSCOPY WITH PROPOFOL N/A 01/02/2020   Procedure: COLONOSCOPY WITH PROPOFOL;  Surgeon: Eloise Harman, DO;  Location: [facility];  Service: Endoscopy;  Laterality: N/A;  1:15pm-pt at [facility]   Dialysis Shunts     previous one removed from left arm and now present one in right arm   DIALYSIS/PERMA CATHETER INSERTION Right 04/17/2016   Procedure: dialysis Catheter Insertion central veinous;  Surgeon: Waynetta Sandy, MD;  Location: [place] CV LAB;  Service: Cardiovascular;  Laterality: Right;   ESOPHAGOGASTRODUODENOSCOPY  12/15/2010   [place]ourk: erosive reflux esophagitis, MW tear, hiatal hernia (small), gastritis with no h.pylori, possibly nsaid related   ESOPHAGOGASTRODUODENOSCOPY (EGD) WITH PROPOFOL N/A 07/11/2020    Surgeon: Irving Copas., MD; normal esophagus, 4 cm hiatal hernia, gastritis biopsied (mild chronic gastritis, negative H. pylori), single duodenal polyp (Brunner's gland hyperplasia), s/p duodenal biopsy benign, duodenal narrowing at the duodenal sweep.   EUS N/A 07/11/2020    Surgeon: Irving Copas., MD;  dilated CBD and common hepatic duct without stones or sludge in the bile duct, stones and biliary sludge in the gallbladder, pancreatic parenchyma with hyperechoic strands, pancreatic duct dilated with prominent sidebranches in the neck and body and tail, no pancreatic mass though visulalization limited, no ampullary lesion, no malignant appearing lymph node   EXCISION OF BREAST BIOPSY Right 05/04/2012   Procedure: EXCISION OF BREAST BIOPSY;  Surgeon: Donato Heinz, MD;  Location: [facility];  Service: General;  Laterality: Right;  Right Excisional Breast Biopsy   FISTULOGRAM Right 03/26/2014  Procedure: Right Arm Fistulogram with Venoplasty Right Subclavian Vein and Inominate Vein. Debridement Fistula Ulcer;  Surgeon: Conrad [place], MD;  Location: [place];  Service: Vascular;  Laterality: Right;   FISTULOGRAM Right 03/29/2017   Procedure: FISTULOGRAM COMPLEX RIGHT ARM with Balloon angioplasty;  Surgeon: Waynetta Sandy, MD;   Location: [place];  Service: Vascular;  Laterality: Right;   HEMOSTASIS CLIP PLACEMENT  07/11/2020   Procedure: HEMOSTASIS CLIP PLACEMENT;  Surgeon: Irving Copas., MD;  Location: [place];  Service: Gastroenterology;;   IR GENERIC HISTORICAL  05/19/2016   IR REMOVAL TUN CV CATH W/O FL 05/19/2016 Markus Daft, MD [facility]-INTERV RAD   LIGATION OF ARTERIOVENOUS  FISTULA Right 11/12/2020   Procedure: LIGATION OF ARTERIOVENOUS  FISTULA WITH EXCISION OF NECROTIC TISSUE;  Surgeon: Waynetta Sandy, MD;  Location: [place];  Service: Vascular;  Laterality: Right;   PERIPHERAL VASCULAR BALLOON ANGIOPLASTY Right 04/17/2016   Procedure: Peripheral Vascular Balloon Angioplasty;  Surgeon: Waynetta Sandy, MD;  Location: [place] CV LAB;  Service: Cardiovascular;  Laterality: Right;  Central segment AV Fistula   PERIPHERAL VASCULAR BALLOON ANGIOPLASTY Right 03/14/2018   Procedure: PERIPHERAL VASCULAR BALLOON ANGIOPLASTY;  Surgeon: Waynetta Sandy, MD;  Location: [place] [facility] LAB;  Service: Cardiovascular;  Laterality: Right;  subclavian vein   PERIPHERAL VASCULAR BALLOON ANGIOPLASTY Right 08/28/2019   Procedure: PERIPHERAL VASCULAR BALLOON ANGIOPLASTY;  Surgeon: Waynetta Sandy, MD;  Location: [place] [facility];  Service: Cardiovascular;  Laterality: Right;  upper arm   PERIPHERAL VASCULAR BALLOON ANGIOPLASTY Right 11/11/2020   Procedure: PERIPHERAL VASCULAR BALLOON ANGIOPLASTY;  Surgeon: Waynetta Sandy, MD;  Location: [place] CV LAB;  Service: Cardiovascular;  Laterality: Right;   PERIPHERAL VASCULAR INTERVENTION Right 03/14/2018   Procedure: PERIPHERAL VASCULAR INTERVENTION;  Surgeon: Waynetta Sandy, MD;  Location: [place] [facility];  Service: Cardiovascular;  Laterality: Right;  subclavian vein   POLYPECTOMY  07/11/2020   Procedure: POLYPECTOMY;  Surgeon: Mansouraty, Telford Nab., MD;  Location: [facility] ENDOSCOPY;  Service: Gastroenterology;;   REVISON  OF ARTERIOVENOUS FISTULA Right 11/12/2020   Procedure: CREATION OF RIGHT ARM ARTERIOVENOUS FISTULA;  Surgeon: Waynetta Sandy, MD;  Location: [place];  Service: Vascular;  Laterality: Right;   SHUNTOGRAM Right 10/19/2013   Procedure: FISTULOGRAM;  Surgeon: Conrad Salem, MD;  Location: [facility] CATH LAB;  Service: Cardiovascular;  Laterality: Right;   TONSILLECTOMY     VISCERAL VENOGRAPHY Right 03/14/2018   Procedure: CENTRAL VENOGRAPHY;  Surgeon: Waynetta Sandy, MD;  Location: [place] [facility] LAB;  Service: Cardiovascular;  Laterality: Right;  upper ext fistula   HPI:  Pt is a 70 y.o. female who presented with end-stage renal disease 11/11/20 for right A/V fistulagram. 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 On 9/19 patient was noted to have altered mental status with staring off, delayed responses, and concern for seizure activity with bilateral upper arm jerking with midline gaze and staring off. MRI 9/91 negative for acute changes. Pt given Narcan with initial improvement in mental status, but continued "staring off and delayed responses". 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 PMH: anemia, anxiety, atrial flutter, C diff, depression, ESRD on HD, HIV, HTN, intracranial hemorrhage, pulmonary HTN, tachycardia, TIA, traumatic hematoma of R UE    Assessment / Plan / Recommendation  Clinical Impression  Pt was seen for bedside swallow evaluation and she denied a history of dysphagia. Pt's reliability as a historian is questioned. However, pt's NT reported that the  pt's swallow function had been WNL until 9/16 when she was able to eat a [employer] chicken sandwich, but that p.o. intake has been reduced this week and that she demonstrated emesis after eating yesterday when she returned from HD. Oral mechanism exam was [facility]. Pt was edentulous and dentures are not at not current the hospital. Pt demonstrated prolonged mastication and mild-moderate  oral residue with regular texture solids.  Mild pocketing was noted with this consistency. Residue was cleared with liquid wash and secondary swallows. Pt's diet will be downgraded to dysphagia 2 solids and thin liquids. SLP will follow pt.  SLP Visit Diagnosis: Dysphagia, unspecified (R13.10)    Aspiration Risk  Mild aspiration risk    Diet Recommendation Dysphagia 2 (Fine chop);Thin liquid   Liquid Administration via: Cup;Straw Medication Administration: Crushed with puree (or whole with puree) Supervision: Patient able to self feed Postural Changes: Seated upright at 90 degrees    Other  Recommendations Oral Care Recommendations: Oral care BID    Recommendations for follow up therapy are one component of a multi-disciplinary discharge planning process, led by the attending physician.  Recommendations may be updated based on patient status, additional functional criteria and insurance authorization.  Follow up Recommendations None      Frequency and Duration min 2x/week  2 weeks       Prognosis Prognosis for Safe Diet Advancement: Fair Barriers to Reach Goals: Cognitive deficits;Severity of deficits      Swallow Study   General Date of Onset: 11/18/20 HPI: Pt is a 70 y.o. female who presented with end-stage renal disease 11/11/20 for right A/V fistulagram. 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 On 9/19 patient was noted to have altered mental status with staring off, delayed responses, and concern for seizure activity with bilateral upper arm jerking with midline gaze and staring off. MRI 9/91 negative for acute changes. Pt given Narcan with initial improvement in mental status, but continued "staring off and delayed responses". 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 PMH: anemia, anxiety, atrial flutter, C diff, depression, ESRD on HD, HIV, HTN, intracranial hemorrhage, pulmonary HTN, tachycardia, TIA, traumatic hematoma of R  UE Type of Study: Bedside Swallow Evaluation Previous Swallow Assessment: none Diet Prior to this Study: Regular;Thin liquids Temperature Spikes Noted: No Respiratory Status: Room air History of Recent Intubation: No Behavior/Cognition: Alert;Cooperative;Confused Oral Cavity Assessment: Within Functional Limits Oral Care Completed by SLP: No Oral Cavity - Dentition: Edentulous Vision: Functional for self-feeding Self-Feeding Abilities: Needs assist Patient Positioning: Upright in bed;Postural control adequate for testing Baseline Vocal Quality: Hoarse Volitional Cough: Cognitively unable to elicit Volitional Swallow: Able to elicit    Oral/Motor/Sensory Function Overall Oral Motor/Sensory Function: Within functional limits   Ice Chips Ice chips: Within functional limits Presentation: Spoon   Thin Liquid Thin Liquid: Within functional limits Presentation: Straw    Nectar Thick Nectar Thick Liquid: Not tested   Honey Thick Honey Thick Liquid: Not tested   Puree Puree: Within functional limits Presentation: Spoon   Solid     Solid: Impaired Oral Phase Impairments: Impaired mastication Oral Phase Functional Implications: Oral residue;Impaired mastication     Brandey Vandalen I. Hardin Negus, [place], [place] Office number [phone] Pager [phone]  Horton Marshall 11/20/2020,11:07 AM

## 2020-11-20 NOTE — Consult Note (Signed)
[place] Nurse wound follow up Wound type:Routine NPWT dressing change to Right forearm. Seen with [facility] PA-C S. Rhyne, who took a photo prior to application of new dressing. Measurement:3.4cm x 2cm x 0.6cm. Undermined area is nearly closed now at 12 o'clock. Wound bed:red, moist Drainage (amount, consistency, odor) serous Periwound:intact with staples proximal and distal to wound Dressing procedure/placement/frequency: Dressing removed and 1 piece of black foam removed from wound. Wound cleansed with NS and gently patted dry. Mepitel silicone wound contact layer used to cover staple lines closely approximating incision proximal and distal to wound. One piece of black foam used to fill wound defect. Dressing covered with drape, T.R.A.C.C. pad attached and dressing connected to 112mmHg continuous negative pressure.  An immediate seal is achieved. Patent tolerated procedure well.  Next schedule dressing change is on Friday, 11/22/20.  Two dressing kits and 1.5 pieces of Mepitel are in the room.  [place] nursing team will follow, and will remain available to this patient, the nursing and medical teams.   Thanks, Maudie Flakes, MSN, RN, [place], Arther Abbott  Pager# [phone]

## 2020-11-20 NOTE — Progress Notes (Signed)
Progress Note  Patient Name: [name] Date of Encounter: 11/20/2020  [facility] Cardiologist: Dorris Carnes, MD   Subjective   Remains confused, denies any chest pain  Inpatient Medications    Scheduled Meds:  vitamin C  500 mg Oral Q12H   calcium acetate  1,334 mg Oral TID WC   Chlorhexidine Gluconate Cloth  6 each Topical Q0600   cinacalcet  60 mg Oral Q T,Th,Sa-HD   clopidogrel  75 mg Oral Q breakfast   darbepoetin (ARANESP) injection - DIALYSIS  100 mcg Intravenous Q Thu-HD   dolutegravir  50 mg Oral q1600   heparin  5,000 Units Subcutaneous Q8H   lamiVUDine  50 mg Oral QAC breakfast   melatonin  3 mg Oral Q24H   mirtazapine  7.5 mg Oral QHS   multivitamin with minerals  1 tablet Oral Q24H   pantoprazole  40 mg Oral BID AC   senna  2 tablet Oral Q24H   sodium chloride flush  3 mL Intravenous Q12H   venlafaxine XR  150 mg Oral Q breakfast   zidovudine  100 mg Oral 3 times per day   Continuous Infusions:  sodium chloride     PRN Meds: sodium chloride, acetaminophen, calcitRIOL, calcium acetate, hydrALAZINE, labetalol, naLOXone (NARCAN)  injection, nitroGLYCERIN, ondansetron (ZOFRAN) IV, ondansetron, sodium chloride flush   Vital Signs    Vitals:   11/19/20 2314 11/20/20 0337 11/20/20 0644 11/20/20 0728  BP: 140/73 (!) 152/80  (!) 149/84  Pulse: 85 87  84  Resp: 14 19  18   Temp: 98.7 F (37.1 C) 98 F (36.7 C)  98.8 F (37.1 C)  TempSrc: Oral Oral  Oral  SpO2: 95% 97%  92%  Weight:   42.8 kg   Height:        Intake/Output Summary (Last 24 hours) at 11/20/2020 0915 Last data filed at 11/19/2020 1748 Gross per 24 hour  Intake 440 ml  Output 1100 ml  Net -660 ml   Last 3 Weights 11/20/2020 11/19/2020 11/19/2020  Weight (lbs) 94 lb 5.7 oz 91 lb 14.9 oz 95 lb 7.4 oz  Weight (kg) 42.8 kg 41.7 kg 43.3 kg      Telemetry    NSR  in 80-90s- Personally Reviewed  ECG    No new EKG - Personally Reviewed  Physical Exam   GEN: Confused, not  answering questions Neck: No JVD Cardiac: RRR, no murmurs, rubs, or gallops.  Respiratory: Clear to auscultation bilaterally. GI: Soft, nontender, non-distended  MS: No edema; No deformity. Neuro:  Confused Psych: Unable to assess  Labs    High Sensitivity Troponin:   Recent Labs  Lab 11/18/20 1505 11/18/20 1634  TROPONINIHS 44* 44*      Chemistry Recent Labs  Lab 11/14/20 1000 11/18/20 0334 11/19/20 0125  NA 134* 133* 135  K 4.4 3.9 4.2  CL 95* 96* 97*  CO2 29 28 27   GLUCOSE 81 77 77  BUN 24* 21 29*  CREATININE 6.87* 6.18* 8.06*  CALCIUM 9.5 9.7 10.1  ALBUMIN 2.9* 2.5*  --   GFRNONAA 6* 7* 5*  ANIONGAP 10 9 11      Lipids No results for input(s): CHOL, TRIG, HDL, LABVLDL, LDLCALC, CHOLHDL in the last 168 hours.  Hematology Recent Labs  Lab 11/14/20 0921 11/18/20 0334 11/19/20 0125  WBC 3.9* 3.2* 3.9*  RBC 2.35* 2.16* 2.41*  HGB 7.4* 7.0* 7.7*  HCT 25.2* 23.4* 25.4*  MCV 107.2* 108.3* 105.4*  MCH 31.5 32.4 32.0  MCHC 29.4* 29.9* 30.3  RDW 15.1 15.3 15.3  PLT 161 216 243    Thyroid No results for input(s): TSH, FREET4 in the last 168 hours.  BNPNo results for input(s): BNP, PROBNP in the last 168 hours.  DDimer No results for input(s): DDIMER in the last 168 hours.   Radiology    MR BRAIN WO CONTRAST  Result Date: 11/18/2020 CLINICAL DATA:  Mental status change, unknown cause EXAM: MRI HEAD WITHOUT CONTRAST TECHNIQUE: Multiplanar, multiecho pulse sequences of the brain and surrounding structures were obtained without intravenous contrast. COMPARISON:  None. FINDINGS: Brain: There is no acute infarction or intracranial hemorrhage. There is no intracranial mass, mass effect, or edema. There is no hydrocephalus or extra-axial fluid collection. Prominence of the ventricles and sulci reflects mild generalized parenchymal volume loss. Patchy and confluent areas of T2 hyperintensity in the supratentorial white matter are nonspecific but may reflect moderate  chronic microvascular ischemic changes. Vascular: Major vessel flow voids at the skull base are preserved. Skull and upper cervical spine: Normal marrow signal is preserved. Sinuses/Orbits: Paranasal sinuses are aerated. Orbits are unremarkable. Other: Sella is unremarkable.  Mastoid air cells are clear. IMPRESSION: No evidence of recent infarction, hemorrhage, or mass. Moderate chronic microvascular ischemic changes. Electronically Signed   By: Macy Mis M.D.   On: 11/18/2020 09:49   ECHOCARDIOGRAM COMPLETE  Result Date: 11/19/2020    ECHOCARDIOGRAM REPORT   Patient Name:   [name] Date of Exam: 11/19/2020 Medical Rec #:  [id number]        Height:       65.0 in Accession #:    [id number]       Weight:       91.9 lb Date of Birth:  11-16-1950       BSA:          1.422 m Patient Age:    70 years         BP:           148/69 mmHg Patient Gender: F                HR:           91 bpm. Exam Location:  Inpatient Procedure: 2D Echo, 3D Echo, Cardiac Doppler and Color Doppler Indications:    Chest Pain R07.9  History:        Patient has prior history of Echocardiogram examinations, most                 recent 02/21/2015. TIA and Pulmonary HTN; Arrythmias:Atrial                 Flutter and Tachycardia. GERD. End stage renal disease.  Sonographer:    Darlina Sicilian RDCS Referring Phys: [id number] [place]  1. Left ventricular ejection fraction, by estimation, is 60 to 65%. Left ventricular ejection fraction by 3D volume is 64 %. The left ventricle has normal function. The left ventricle has no regional wall motion abnormalities. Left ventricular diastolic  function could not be evaluated.  2. Right ventricular systolic function is normal. The right ventricular size is normal. Tricuspid regurgitation signal is inadequate for assessing PA pressure.  3. Left atrial size was mildly dilated.  4. The mitral valve is normal in structure. Trivial mitral valve regurgitation. No evidence of mitral  stenosis.  5. The aortic valve is tricuspid. Aortic valve regurgitation is not visualized. Mild to moderate aortic valve sclerosis/calcification is present, without any evidence of aortic  stenosis.  6. The inferior vena cava is normal in size with greater than 50% respiratory variability, suggesting right atrial pressure of 3 mmHg. FINDINGS  Left Ventricle: Left ventricular ejection fraction, by estimation, is 60 to 65%. Left ventricular ejection fraction by 3D volume is 64 %. The left ventricle has normal function. The left ventricle has no regional wall motion abnormalities. The left ventricular internal cavity size was normal in size. There is no left ventricular hypertrophy. Left ventricular diastolic function could not be evaluated. Right Ventricle: The right ventricular size is normal. No increase in right ventricular wall thickness. Right ventricular systolic function is normal. Tricuspid regurgitation signal is inadequate for assessing PA pressure. Left Atrium: Left atrial size was mildly dilated. Right Atrium: Right atrial size was normal in size. Pericardium: There is no evidence of pericardial effusion. Mitral Valve: The mitral valve is normal in structure. Trivial mitral valve regurgitation. No evidence of mitral valve stenosis. Tricuspid Valve: The tricuspid valve is normal in structure. Tricuspid valve regurgitation is trivial. No evidence of tricuspid stenosis. Aortic Valve: The aortic valve is tricuspid. Aortic valve regurgitation is not visualized. Mild to moderate aortic valve sclerosis/calcification is present, without any evidence of aortic stenosis. Pulmonic Valve: The pulmonic valve was normal in structure. Pulmonic valve regurgitation is trivial. No evidence of pulmonic stenosis. Aorta: The aortic root is normal in size and structure. Venous: The inferior vena cava is normal in size with greater than 50% respiratory variability, suggesting right atrial pressure of 3 mmHg. IAS/Shunts: No atrial  level shunt detected by color flow Doppler.   3D Volume EF LV 3D EF:    Left ventricular ejection fraction by 3D volume is 64              %.  3D Volume EF: 3D EF:        64 % Fransico Him MD Electronically signed by Fransico Him MD Signature Date/Time: 11/19/2020/3:44:21 PM    Final     Cardiac Studies     Patient Profile     70 y.o. female with a history of isolated episode of atrial flutter in 2016 not on anticoagulation, prior TIA, PAD with prior stenting to right innominate artery, hypertension, ESRD on hemodialysis, thrombocytopenia, GERD, HIV, and chronic pain who is being seen for the evaluation of chest pain   Assessment & Plan     Questionable Chest Pain - Patient had questionable episode of chest pain 9/19. However, she has had mental status changes so difficult to get reliable history.  - EKG showed showed normal sinus rhythm, rate 86 bpm, with T wave inversions/mild ST depression in anterolateral leads. She has had transient ST/T changes in these leads before.. - Troponin 44>44, not consistent with ACS - Echo 9/20 with normal biventricular function, no regional wall motion abnormalities, mild left atrial dilatation, no significant valvular disease. -Recommend outpatient follow-up.  Can consider ischemia evaluation as outpatient if having further chest pain once encephalopathy resolves   Isolated Episodes of Atrial Flutter - Isolated episode of atrial flutter in 2016 with no recurrence. Therefore, not on anticoagulation.  - No evidence of atrial flutter or fibrillation on EKG or telemetry.   Hypertension - BP very labile  - Not on any antihypertensives at home. - Consider adding Amlodipine if patient does not having any additional low BP readings.   PAD Necrotic Pseudoaneurysm - Underwent fistulogram on 11/11/2020  found to have a necrotic pseudoaneurysm in the forearm subtotally occluded innominate stent which was treated with drug-coated  balloon angioplasty. Patient was  admitted and returned to the OR on 11/12/2020 for resection of distal fistula and conversion to upper arm fistula on the right. - Management per Vascular Surgery.   ESRD On Hemodialysis - Management per Nephrology.   Mental Status Change - Patient noted to have acute mental status change - staring off to space and having delayed responses. Also had some jerking of her upper body e - Brain MRI showed no acute abnormalities - Neurology consulted, planning EEG   Otherwise, per primary team: - Thrombocytopenia - Anemia of chronic kidney disease - HIV  [facility] will sign off.   Medication Recommendations: No changes Other recommendations (labs, testing, etc): None Follow up as an outpatient: Will schedule outpatient follow-up   For questions or updates, please contact [place] Please consult [email or web address] for contact info under        Signed, Donato Heinz, MD  11/20/2020, 9:15 AM

## 2020-11-20 NOTE — Evaluation (Signed)
Speech Language Pathology Evaluation Patient Details Name: [name] MRN: [id number] DOB: December 15, 1950 Today's Date: 11/20/2020 Time: 5366-4403 SLP Time Calculation (min) (ACUTE ONLY): 21 min  Problem List:  Patient Active Problem List   Diagnosis Date Noted   Chest pain of uncertain etiology    Altered mental status    ESRD (end stage renal disease) ([place]) 11/11/2020   Pancreatic abnormality 08/05/2020   Gastritis without bleeding 08/05/2020   Dilated pancreatic duct 05/15/2020   Abnormal MRI of abdomen 05/15/2020   Common bile duct dilation    Lobar pneumonia ([place]) 07/20/2019   Acute respiratory failure with hypoxia ([place]) 07/20/2019   Colitis 07/19/2019   Dilation of common bile duct 07/19/2019   Ventral hernia without obstruction or gangrene    Hypocalcemia 03/07/2019   Other pancytopenia ([place]) 12/08/2018   Anemia of chronic disease 04/17/2016   Hepatitis B immune 01/28/2016   Atrial flutter ([place]) 02/22/2015   Hyperkalemia 02/20/2015   Venous stenosis of right upper extremity 04/06/2014   ESRD on dialysis ([place]) 04/06/2014   Essential hypertension    Non-intractable vomiting 12/15/2012   CHF, chronic ([place]) 11/25/2012   Atrial fibrillation ([place]) 05/02/2012   Intracranial bleed ([place]) 12/28/2011   Angioedema of lips 09/21/2011   Cellulitis of breast 03/09/2011   Erosive esophagitis 12/15/2010   Mallory - Weiss tear 12/15/2010   UGI bleed 12/13/2010   DIARRHEA 04/12/2009   Human immunodeficiency virus (HIV) disease ([place]) 11/15/2008   PANCREATITIS, HX OF 11/15/2008   TOBACCO USE, QUIT 11/15/2008   PAIN IN JOINT, UPPER ARM 08/07/2008   ABDOMINAL WALL HERNIA 04/26/2007   BRONCHITIS, ACUTE 11/16/2006   HYPOKALEMIA, HX OF 07/13/2006   DEPRESSION 03/23/2006   Essential hypertension, malignant 03/23/2006   GERD 03/23/2006   PANCREATITIS 03/23/2006   MASS, RIGHT AXILLA 03/23/2006   HEADACHE 03/23/2006   SYMPTOM, ENLARGEMENT, LYMPH NODES 03/23/2006   SHINGLES, HX OF  03/23/2006   HYSTERECTOMY, TOTAL, HX OF 03/23/2006   Past Medical History:  Past Medical History:  Diagnosis Date   Anemia    Anxiety    Atrial flutter ([place]) 02/22/2015   C. difficile colitis 10/10/11   Chronic diarrhea    Chronic pain    Depression    ESRD on hemodialysis ([place])    Dialysis [name]   GERD (gastroesophageal reflux disease)    HIV (human immunodeficiency virus infection) ([place])    Hypertension    Insomnia    Intracranial hemorrhage (HCC)    Muscle weakness (generalized)    Pulmonary HTN ([place])    Tachycardia    TIA (transient ischemic attack)    [facility] do not have this dx   Traumatic hematoma of right upper arm 02/22/2015   Past Surgical History:  Past Surgical History:  Procedure Laterality Date   A/V FISTULAGRAM N/A 04/17/2016   Procedure: A/V Fistulagram - Right Upper;  Surgeon: Waynetta Sandy, MD;  Location: [place] [facility];  Service: Cardiovascular;  Laterality: N/A;   A/V FISTULAGRAM Right 08/28/2019   Procedure: A/V FISTULAGRAM;  Surgeon: Waynetta Sandy, MD;  Location: [facility] [facility];  Service: Cardiovascular;  Laterality: Right;   ABDOMINAL HYSTERECTOMY     BIOPSY  01/02/2020   Procedure: BIOPSY;  Surgeon: Eloise Harman, DO;  [facility];  Service: Endoscopy;;   BIOPSY  07/11/2020   Procedure: BIOPSY;  Surgeon: Irving Copas., MD;  Location: [facility];  Service: Gastroenterology;;   BIOPSY THYROID  CARPAL TUNNEL RELEASE Right 11/16/2019   Procedure: CARPAL TUNNEL RELEASE;  Surgeon: Leanora Cover, MD;  Location: [facility];  Service: Orthopedics;  Laterality: Right;   COLONOSCOPY WITH PROPOFOL N/A 01/02/2020   Procedure: COLONOSCOPY WITH PROPOFOL;  Surgeon: Eloise Harman, DO;  [facility];  Service: Endoscopy;  Laterality: N/A;  1:15pm-pt at [facility]   Dialysis Shunts     previous one removed from left arm and now present one in right arm    DIALYSIS/PERMA CATHETER INSERTION Right 04/17/2016   Procedure: dialysis Catheter Insertion central veinous;  Surgeon: Waynetta Sandy, MD;  Location: [facility];  Service: Cardiovascular;  Laterality: Right;   ESOPHAGOGASTRODUODENOSCOPY  12/15/2010   [place]: erosive reflux esophagitis, MW tear, hiatal hernia (small), gastritis with no h.pylori, possibly nsaid related   ESOPHAGOGASTRODUODENOSCOPY (EGD) WITH PROPOFOL N/A 07/11/2020    Surgeon: Irving Copas., MD; normal esophagus, 4 cm hiatal hernia, gastritis biopsied (mild chronic gastritis, negative H. pylori), single duodenal polyp (Brunner's gland hyperplasia), s/p duodenal biopsy benign, duodenal narrowing at the duodenal sweep.   EUS N/A 07/11/2020    Surgeon: Irving Copas., MD;  dilated CBD and common hepatic duct without stones or sludge in the bile duct, stones and biliary sludge in the gallbladder, pancreatic parenchyma with hyperechoic strands, pancreatic duct dilated with prominent sidebranches in the neck and body and tail, no pancreatic mass though visulalization limited, no ampullary lesion, no malignant appearing lymph node   EXCISION OF BREAST BIOPSY Right 05/04/2012   Procedure: EXCISION OF BREAST BIOPSY;  Surgeon: Donato Heinz, MD;  Location: [facility];  Service: General;  Laterality: Right;  Right Excisional Breast Biopsy   FISTULOGRAM Right 03/26/2014   Procedure: Right Arm Fistulogram with Venoplasty Right Subclavian Vein and Inominate Vein. Debridement Fistula Ulcer;  Surgeon: Conrad [place], MD;  Location: [facility];  Service: Vascular;  Laterality: Right;   FISTULOGRAM Right 03/29/2017   Procedure: FISTULOGRAM COMPLEX RIGHT ARM with Balloon angioplasty;  Surgeon: Waynetta Sandy, MD;  Location: [facility];  Service: Vascular;  Laterality: Right;   HEMOSTASIS CLIP PLACEMENT  07/11/2020   Procedure: HEMOSTASIS CLIP PLACEMENT;  Surgeon: Irving Copas., MD;  Location: [facility];  Service:  Gastroenterology;;   IR GENERIC HISTORICAL  05/19/2016   IR REMOVAL TUN CV CATH W/O FL 05/19/2016 Markus Daft, MD [facility]   LIGATION OF ARTERIOVENOUS  FISTULA Right 11/12/2020   Procedure: LIGATION OF ARTERIOVENOUS  FISTULA WITH EXCISION OF NECROTIC TISSUE;  Surgeon: Waynetta Sandy, MD;  Location: [place];  Service: Vascular;  Laterality: Right;   PERIPHERAL VASCULAR BALLOON ANGIOPLASTY Right 04/17/2016   Procedure: Peripheral Vascular Balloon Angioplasty;  Surgeon: Waynetta Sandy, MD;  Location: [place] [facility];  Service: Cardiovascular;  Laterality: Right;  Central segment AV Fistula   PERIPHERAL VASCULAR BALLOON ANGIOPLASTY Right 03/14/2018   Procedure: PERIPHERAL VASCULAR BALLOON ANGIOPLASTY;  Surgeon: Waynetta Sandy, MD;  Location: [facility];  Service: Cardiovascular;  Laterality: Right;  subclavian vein   PERIPHERAL VASCULAR BALLOON ANGIOPLASTY Right 08/28/2019   Procedure: PERIPHERAL VASCULAR BALLOON ANGIOPLASTY;  Surgeon: Waynetta Sandy, MD;  Location: [facility];  Service: Cardiovascular;  Laterality: Right;  upper arm   PERIPHERAL VASCULAR BALLOON ANGIOPLASTY Right 11/11/2020   Procedure: PERIPHERAL VASCULAR BALLOON ANGIOPLASTY;  Surgeon: Waynetta Sandy, MD;  Location: [facility];  Service: Cardiovascular;  Laterality: Right;   PERIPHERAL VASCULAR INTERVENTION Right 03/14/2018   Procedure: PERIPHERAL VASCULAR  INTERVENTION;  Surgeon: Waynetta Sandy, MD;  Location: [place] [facility];  Service: Cardiovascular;  Laterality: Right;  subclavian vein   POLYPECTOMY  07/11/2020   Procedure: POLYPECTOMY;  Surgeon: Mansouraty, Telford Nab., MD;  Location: [facility] ENDOSCOPY;  Service: Gastroenterology;;   REVISON OF ARTERIOVENOUS FISTULA Right 11/12/2020   Procedure: CREATION OF RIGHT ARM ARTERIOVENOUS FISTULA;  Surgeon: Waynetta Sandy, MD;  Location: [place];  Service: Vascular;  Laterality: Right;   SHUNTOGRAM  Right 10/19/2013   Procedure: FISTULOGRAM;  Surgeon: Conrad [place], MD;  Location: [facility];  Service: Cardiovascular;  Laterality: Right;   TONSILLECTOMY     VISCERAL VENOGRAPHY Right 03/14/2018   Procedure: CENTRAL VENOGRAPHY;  Surgeon: Waynetta Sandy, MD;  Location: [place] [facility];  Service: Cardiovascular;  Laterality: Right;  upper ext fistula   HPI:  Pt is a 70 y.o. female who presented with end-stage renal disease 11/11/20 for right A/V fistulagram. 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 On 9/19 patient was noted to have altered mental status with staring off, delayed responses, and concern for seizure activity with bilateral upper arm jerking with midline gaze and staring off. MRI 9/91 negative for acute changes. Pt given Narcan with initial improvement in mental status, but continued "staring off and delayed responses". Neurology consulted and HIV encephalopathy questioned 9/21, but recommends EEG due to possibility of seizure. PMH: anemia, anxiety, atrial flutter, C diff, depression, ESRD on HD, HIV, HTN, intracranial hemorrhage, pulmonary HTN, tachycardia, TIA, traumatic hematoma of R UE   Assessment / Plan / Recommendation Clinical Impression  Pt was seen for speech/language/cognition evaluation. She reported that she lives in a SNF at baseline. She denied any baseline or acute deficits in speech, language, cognition. However, her reliability as a historian is questioned due to the nature of some of her responses. Pt's speech was WNL and adequately intelligible. She presented with cognitive-linguistic deficits related to attention, memory, problem solving, and reasoning. She was able to complete 1-step commands and accurately answer simple yes/no questions, but she exhibited difficulty with auditory comprehension beyond that point. She participated in simple conversation with adequate word retrieval and sentence formulation. However, if left  uninterrupted, she repeated utterances (frequently the responses to questions) up to 20 times. She exhibited difficulty with retrieval of some lower-frequency words structured naming and benefited from additional processing time across tasks. It is recommended that pt receive SLP services at next venue of care if her symptoms do not resolve after treatment of acute illness.    SLP Assessment  SLP Recommendation/Assessment: All further Speech Lanaguage Pathology  needs can be addressed in the next venue of care SLP Visit Diagnosis: Cognitive communication deficit (R41.841)    Recommendations for follow up therapy are one component of a multi-disciplinary discharge planning process, led by the attending physician.  Recommendations may be updated based on patient status, additional functional criteria and insurance authorization.    Follow Up Recommendations  Skilled Nursing facility    Frequency and Duration min 2x/week  2 weeks      SLP Evaluation Cognition  Overall Cognitive Status: Impaired/Different from baseline Arousal/Alertness: Awake/alert Orientation Level: Oriented to person;Oriented to place;Oriented to situation Year: 2021 Month: September Day of Week: Incorrect Attention: Focused;Sustained Focused Attention: Impaired Focused Attention Impairment: Verbal basic Sustained Attention: Impaired Sustained Attention Impairment: Verbal basic Memory: Impaired Memory Impairment: Retrieval deficit;Decreased recall of new information (Immediate: 5/5; delayed: 4/5) Awareness: Impaired Awareness Impairment: Intellectual impairment  Comprehension  Auditory Comprehension Overall Auditory Comprehension: Impaired Yes/No Questions: Impaired Basic Biographical Questions:  (3/5) Complex Questions:  (2/5) Paragraph Comprehension (via yes/no questions):  (0/4) Commands: Impaired One Step Basic Commands:  (5/5) Two Step Basic Commands:  (2/5) Multistep Basic Commands:   (0/4) Conversation: Simple Interfering Components: Attention;Processing speed;Working Marine scientist    Expression Expression Primary Mode of Expression: Verbal Verbal Expression Overall Verbal Expression: Impaired Initiation: Impaired Level of Generative/Spontaneous Verbalization: Sentence Repetition: No impairment Naming: Impairment Responsive:  (3/5) Confrontation:  (7/10) Interfering Components: Attention   Oral / Motor  Oral Motor/Sensory Function Overall Oral Motor/Sensory Function: Within functional limits Motor Speech Overall Motor Speech: Appears within functional limits for tasks assessed Respiration: Within functional limits Phonation: Low vocal intensity Resonance: Within functional limits Articulation: Within functional limitis Intelligibility: Intelligible Motor Planning: Witnin functional limits Motor Speech Errors: Aware   Shyler Hamill I. Hardin Negus, [place], [place] Office number [phone] Pager [place] 11/20/2020, 1:21 PM

## 2020-11-20 NOTE — Progress Notes (Signed)
EEG done at bedside. Results pending.  

## 2020-11-20 NOTE — Procedures (Signed)
Routine EEG Report  [name] is a 70 y.o. female with a history of altered mental status who is undergoing an EEG to evaluate for seizures.  Report: This EEG was acquired with electrodes placed according to the International 10-20 electrode system (including Fp1, Fp2, F3, F4, C3, C4, P3, P4, O1, O2, T3, T4, T5, T6, A1, A2, Fz, Cz, Pz). The following electrodes were missing or displaced: none.  The best background was 6 Hz with intermittent diffuse slowing in the delta range. This activity is reactive to stimulation. Drowsiness was manifested by background fragmentation; deeper stages of sleep were manifested by K complexes and sleep spindles. There was no focal slowing. There were no interictal epileptiform discharges. There were no electrographic seizures identified. Photic stimulation and hyperventilation were not performed.   Impression and clinical correlation: This EEG was obtained while awake and asleep and is abnormal due to moderate-to-severe diffuse slowing indicating global cerebral dysfunction.   Su Monks, MD [facility] [phone]  If 7pm- 7am, please page neurology on call as listed in [place].

## 2020-11-20 NOTE — Progress Notes (Addendum)
  Progress Note    11/20/2020 7:57 AM 8 Days Post-Op  Subjective:  awake; answers questions and follows commands.  Denies hand pain.   Afebrile HR 80's-90's  655'V-748'O systolic 70% RA  Vitals:   11/20/20 0337 11/20/20 0728  BP: (!) 152/80 (!) 149/84  Pulse: 87 84  Resp: 19 18  Temp: 98 F (36.7 C) 98.8 F (37.1 C)  SpO2: 97% 92%    Physical Exam: General:  no distress Lungs:  non labored Incisions:  incisions healing nicely with staples in tact; open wound looks good with good granulation tissue.  Does not track proximally at this time.   Extremities:  right hand is warm   CBC    Component Value Date/Time   WBC 3.9 (L) 11/19/2020 0125   RBC 2.41 (L) 11/19/2020 0125   HGB 7.7 (L) 11/19/2020 0125   HCT 25.4 (L) 11/19/2020 0125   PLT 243 11/19/2020 0125   MCV 105.4 (H) 11/19/2020 0125   MCH 32.0 11/19/2020 0125   MCHC 30.3 11/19/2020 0125   RDW 15.3 11/19/2020 0125   LYMPHSABS 0.6 (L) 10/18/2019 0941   MONOABS 0.3 10/18/2019 0941   EOSABS 0.1 10/18/2019 0941   BASOSABS 0.0 10/18/2019 0941    BMET    Component Value Date/Time   NA 135 11/19/2020 0125   K 4.2 11/19/2020 0125   CL 97 (L) 11/19/2020 0125   CO2 27 11/19/2020 0125   GLUCOSE 77 11/19/2020 0125   BUN 29 (H) 11/19/2020 0125   CREATININE 8.06 (H) 11/19/2020 0125   CREATININE 5.66 (H) 12/22/2019 1117   CALCIUM 10.1 11/19/2020 0125   CALCIUM 9.0 03/12/2011 0527   GFRNONAA 5 (L) 11/19/2020 0125   GFRNONAA 5 (L) 02/12/2015 1000   GFRAA 9 (L) 10/22/2019 0105   GFRAA 5 (L) 02/12/2015 1000    INR    Component Value Date/Time   INR 1.14 12/28/2011 1403     Intake/Output Summary (Last 24 hours) at 11/20/2020 0757 Last data filed at 11/19/2020 1748 Gross per 24 hour  Intake 440 ml  Output 1100 ml  Net -660 ml     Assessment/Plan:  70 y.o. 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  Now with vacuum  assisted dressing to right forearm  8 Days Post-Op   -wound vac removed today and wound looks very good.  It no longer tracks proximally.  Continue vac.  -neurology evaluated and EEG results pending.  MRI without acute intracranial process. -HD per renal  -DVT prophylaxis:  sq heparin   Leontine Locket, PA-C [facility] [phone] 11/20/2020 7:57 AM   I have independently interviewed and examined patient and agree with PA assessment and plan above.  She still appears somewhat out of it but recognize me immediately today and I was able to have meaningful discussion with her.  Okay for heparin for atrial flutter.  Hayly Litsey C. Donzetta Matters, MD [facility] [place] Office: [phone] Pager: [phone]

## 2020-11-20 NOTE — Progress Notes (Signed)
ANTICOAGULATION CONSULT NOTE - Initial Consult  Pharmacy Consult for Heparin Indication: atrial fibrillation  Allergies  Allergen Reactions   Lactose Intolerance (Gi) Other (See Comments)    On MAR   Latex Rash and Itching   Penicillins Rash and Swelling    Has patient had a PCN reaction causing immediate rash, facial/tongue/throat swelling, SOB or lightheadedness with hypotension: No Has patient had a PCN reaction causing severe rash involving mucus membranes or skin necrosis: No Has patient had a PCN reaction that required hospitalization No Has patient had a PCN reaction occurring within the last 10 years: No If all of the above answers are "NO", then may proceed with Cephalosporin use.      Patient Measurements: Height: 5\' 5"  (165.1 cm) Weight: 42.8 kg (94 lb 5.7 oz) IBW/kg (Calculated) : 57  Heparin Dosing Weight: 47.7 kg  Vital Signs: Temp: 98.7 F (37.1 C) (09/21 1535) Temp Source: Oral (09/21 1535) BP: 120/86 (09/21 1535) Pulse Rate: 96 (09/21 1535)  Labs: Recent Labs    11/18/20 0334 11/18/20 1505 11/18/20 1634 11/19/20 0125  HGB 7.0*  --   --  7.7*  HCT 23.4*  --   --  25.4*  PLT 216  --   --  243  CREATININE 6.18*  --   --  8.06*  TROPONINIHS  --  44* 44*  --     Estimated Creatinine Clearance: 4.6 mL/min (A) (by C-G formula based on SCr of 8.06 mg/dL (H)).   Medical History: Past Medical History:  Diagnosis Date   Anemia    Anxiety    Atrial flutter ([place]) 02/22/2015   C. difficile colitis 10/10/11   Chronic diarrhea    Chronic pain    Depression    ESRD on hemodialysis (HCC)    Dialysis [name]   GERD (gastroesophageal reflux disease)    HIV (human immunodeficiency virus infection) ([place])    Hypertension    Insomnia    Intracranial hemorrhage (HCC)    Muscle weakness (generalized)    Pulmonary HTN (HCC)    Tachycardia    TIA (transient ischemic attack)    [place] do not have this dx   Traumatic hematoma of  right upper arm 02/22/2015    Medications:  Scheduled:   vitamin C  500 mg Oral Q12H   calcium acetate  1,334 mg Oral TID WC   Chlorhexidine Gluconate Cloth  6 each Topical Q0600   cinacalcet  60 mg Oral Q T,Th,Sa-HD   clopidogrel  75 mg Oral Q breakfast   darbepoetin (ARANESP) injection - DIALYSIS  100 mcg Intravenous Q Thu-HD   dolutegravir  50 mg Oral q1600   feeding supplement  237 mL Oral BID BM   heparin  2,500 Units Intravenous Once   lamiVUDine  50 mg Oral QAC breakfast   melatonin  3 mg Oral Q24H   metoprolol tartrate  25 mg Oral BID   mirtazapine  7.5 mg Oral QHS   multivitamin  1 tablet Oral QHS   pantoprazole  40 mg Oral BID AC   senna  2 tablet Oral Q24H   sodium chloride flush  3 mL Intravenous Q12H   venlafaxine XR  150 mg Oral Q breakfast   zidovudine  100 mg Oral 3 times per day   Infusions:   sodium chloride     heparin      Assessment: 70 y.o. female with a history of isolated episode of atrial flutter in 2016 not  on anticoagulation, prior TIA, PAD with prior stenting to right innominate artery, hypertension, ESRD on hemodialysis, thrombocytopenia, GERD, HIV, and chronic pain who is being seen for the evaluation of chest pain now found to be in Aflutter w/ RVR. Pharmacy is consulted to initiate IV heparin for anticoagulation.   No anticoagulation prior to admission. Patient has anemia at baseline. Hgb remains low at 7.7 9/20. Platelets within normal limits with 9/20 @ 243. Will conservatively dose heparin with lower bolus and watch CBC closely. No s/sx bleeding currently per chart review.   Goal of Therapy:  Heparin level 0.3-0.7 units/ml Monitor platelets by anticoagulation protocol: Yes   Plan:  Give 2500 units bolus x 1 Start heparin infusion at 700 units/hr Check anti-Xa level in 8 hours and daily while on heparin Continue to monitor H&H and platelets   Thank you for allowing pharmacy to be a part of this patient's care.  Ardyth Harps,  PharmD Clinical Pharmacist

## 2020-11-20 NOTE — Progress Notes (Signed)
   Notified by RN that patient is tachycardic to the 120s-130s. Patient was asymptomatic. EKG obtained and interestingly showed atrial flutter with RVR with 2:1 AV block. She has chronic anterolateral TWI which are slightly deeper than previous. She has remote history of atrial flutter in 2016 but this is her first known reoccurrence. She is not on anticoagulation.   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 Her hospital course has been complicated by anemia with drop in Hgb from 11.6 on admission, down to 7.0 11/18/20 and stable at 7.7 11/19/20.   Will plan to start metoprolol tartrate 25mg  BID for rate control  Will check with vascular surgery regarding bleeding risk as she would benefit from [facility] given CHA2DS2-VASc Score = 5 [CHF History: 0, HTN History: 0, Diabetes History: 0, Stroke History: 2, Vascular Disease History: 1, Age Score: 1, Gender Score: 1].  The patient's annual risk of stroke is 7.2 %.     Dr. Gardiner Rhyme updated. Cardiology will continue to follow given change in status.   Abigail Butts, PA-C 11/20/20; 1:23 PM

## 2020-11-20 NOTE — Progress Notes (Signed)
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  Patient receives dialysis on Tuesday Thursday Saturday basis.  Next dialysis treatment will be 11/19/2020.  Usual dialysis centers dialysis [facility]  Blood pressure 149/84 pulse 87 temperature 98.8 O2 sats 90% room air  Sodium 135 potassium 4.2 chloride 97 CO2 27 BUN 29 creatinine 8 glucose 77 calcium 10.1 hemoglobin 7.7  Underwent dialysis 11/19/2020 with 1.1 L removed and next dialysis due 11/21/2020      Objective:  Vital signs in last 24 hours:  Temp:  [98 F (36.7 C)-98.8 F (37.1 C)] 98.8 F (37.1 C) (09/21 0728) Pulse Rate:  [69-98] 84 (09/21 0728) Resp:  [13-28] 18 (09/21 0728) BP: (84-198)/(41-115) 149/84 (09/21 0728) SpO2:  [92 %-100 %] 92 % (09/21 0728) Weight:  [41.7 kg-43.3 kg] 42.8 kg (09/21 0644)  Weight change: 1.6 kg Filed Weights   11/19/20 0906 11/19/20 1217 11/20/20 0644  Weight: 43.3 kg 41.7 kg 42.8 kg    Intake/Output: I/O last 3 completed shifts: In: 440 [P.O.:440] Out: 1100 [Other:1100]   Intake/Output this shift:  No intake/output data recorded.  Gen:nad CVS:rrr Resp:normal wob DZH:GDJM, nt Ext: RUE swelling Neuro: awake, alert HD access: RUE AVF, woundvac+dressing in place   Basic Metabolic Panel: Recent Labs  Lab 11/14/20 1000 11/18/20 0334 11/19/20 0125  NA 134* 133* 135  K 4.4 3.9 4.2  CL 95* 96* 97*  CO2 29 28 27   GLUCOSE 81 77 77  BUN 24* 21 29*  CREATININE 6.87* 6.18* 8.06*  CALCIUM 9.5 9.7 10.1  PHOS 3.7 2.9  --      Liver Function Tests: Recent Labs  Lab 11/14/20 1000 11/18/20 0334  ALBUMIN 2.9* 2.5*    No results for input(s): LIPASE, AMYLASE in the last 168 hours. No results for input(s): AMMONIA in the last 168 hours.  CBC: Recent Labs  Lab 11/14/20 0921 11/18/20 0334  11/19/20 0125  WBC 3.9* 3.2* 3.9*  HGB 7.4* 7.0* 7.7*  HCT 25.2* 23.4* 25.4*  MCV 107.2* 108.3* 105.4*  PLT 161 216 243     Cardiac Enzymes: No results for input(s): CKTOTAL, CKMB, CKMBINDEX, TROPONINI in the last 168 hours.  BNP: Invalid input(s): POCBNP  CBG: Recent Labs  Lab 11/15/20 2124 11/16/20 0611 11/16/20 1448 11/17/20 0628 11/18/20 0850  GLUCAP 97 84 82 74 94     Microbiology: Results for orders placed or performed during the hospital encounter of 11/11/20  SARS CORONAVIRUS 2 (TAT 6-24 HRS) Nasopharyngeal Nasopharyngeal Swab     Status: None   Collection Time: 11/12/20  3:06 AM   Specimen: Nasopharyngeal Swab  Result Value Ref Range Status   SARS Coronavirus 2 NEGATIVE NEGATIVE Final    Comment: (NOTE) SARS-CoV-2 target nucleic acids are NOT DETECTED.  The SARS-CoV-2 RNA is generally detectable in upper and lower respiratory specimens during the acute phase of infection. Negative results do not preclude SARS-CoV-2 infection, do not rule out co-infections with other pathogens, and should not be used as the sole basis for treatment or other patient management decisions. Negative results must be combined with clinical observations, patient history, and epidemiological information. The expected result is Negative.  Fact Sheet for Patients: SugarRoll.be  Fact Sheet for Healthcare Providers: https://www.woods-mathews.com/  This test is not yet approved or cleared by the [place] FDA and  has been authorized for detection  and/or diagnosis of SARS-CoV-2 by FDA under an Emergency Use Authorization (EUA). This EUA will remain  in effect (meaning this test can be used) for the duration of the COVID-19 declaration under Se ction 564(b)(1) of the Act, 21 U.S.C. section 360bbb-3(b)(1), unless the authorization is terminated or revoked sooner.  Performed at [facility], [place] [address], [place],  [place] [address]   Surgical pcr screen     Status: Abnormal   Collection Time: 11/12/20  4:52 AM   Specimen: Nasal Mucosa; Nasal Swab  Result Value Ref Range Status   MRSA, PCR POSITIVE (A) NEGATIVE Final    Comment: RESULT CALLED TO, READ BACK BY AND VERIFIED WITH: RN Kristin Bruins DAVIDSON 11/12/20@6 :32 BY TW    Staphylococcus aureus POSITIVE (A) NEGATIVE Final    Comment: RESULT CALLED TO, READ BACK BY AND VERIFIED WITH: RN KERI DAVIDSON 11/12/20@6 :32 BY TW (NOTE) The Xpert SA Assay (FDA approved for NASAL specimens in patients 17 years of age and older), is one component of a comprehensive surveillance program. It is not intended to diagnose infection nor to guide or monitor treatment. Performed at [facility], [place] [address] [place], [place] [address]   SARS CORONAVIRUS 2 (TAT 6-24 HRS) Nasopharyngeal Nasopharyngeal Swab     Status: None   Collection Time: 11/15/20  5:20 PM   Specimen: Nasopharyngeal Swab  Result Value Ref Range Status   SARS Coronavirus 2 NEGATIVE NEGATIVE Final    Comment: (NOTE) SARS-CoV-2 target nucleic acids are NOT DETECTED.  The SARS-CoV-2 RNA is generally detectable in upper and lower respiratory specimens during the acute phase of infection. Negative results do not preclude SARS-CoV-2 infection, do not rule out co-infections with other pathogens, and should not be used as the sole basis for treatment or other patient management decisions. Negative results must be combined with clinical observations, patient history, and epidemiological information. The expected result is Negative.  Fact Sheet for Patients: SugarRoll.be  Fact Sheet for Healthcare Providers: https://www.woods-mathews.com/  This test is not yet approved or cleared by the [place] FDA and  has been authorized for detection and/or diagnosis of SARS-CoV-2 by FDA under an Emergency Use Authorization (EUA). This EUA will remain  in effect  (meaning this test can be used) for the duration of the COVID-19 declaration under Se ction 564(b)(1) of the Act, 21 U.S.C. section 360bbb-3(b)(1), unless the authorization is terminated or revoked sooner.  Performed at [facility], [place] [address], [place], [place] [address]     Coagulation Studies: No results for input(s): LABPROT, INR in the last 72 hours.  Urinalysis: No results for input(s): COLORURINE, LABSPEC, PHURINE, GLUCOSEU, HGBUR, BILIRUBINUR, KETONESUR, PROTEINUR, UROBILINOGEN, NITRITE, LEUKOCYTESUR in the last 72 hours.  Invalid input(s): APPERANCEUR    Imaging: MR BRAIN WO CONTRAST  Result Date: 11/18/2020 CLINICAL DATA:  Mental status change, unknown cause EXAM: MRI HEAD WITHOUT CONTRAST TECHNIQUE: Multiplanar, multiecho pulse sequences of the brain and surrounding structures were obtained without intravenous contrast. COMPARISON:  None. FINDINGS: Brain: There is no acute infarction or intracranial hemorrhage. There is no intracranial mass, mass effect, or edema. There is no hydrocephalus or extra-axial fluid collection. Prominence of the ventricles and sulci reflects mild generalized parenchymal volume loss. Patchy and confluent areas of T2 hyperintensity in the supratentorial white matter are nonspecific but may reflect moderate chronic microvascular ischemic changes. Vascular: Major vessel flow voids at the skull base are preserved. Skull and upper cervical spine: Normal marrow signal is preserved. Sinuses/Orbits: Paranasal  sinuses are aerated. Orbits are unremarkable. Other: Sella is unremarkable.  Mastoid air cells are clear. IMPRESSION: No evidence of recent infarction, hemorrhage, or mass. Moderate chronic microvascular ischemic changes. Electronically Signed   By: Macy Mis M.D.   On: 11/18/2020 09:49   ECHOCARDIOGRAM COMPLETE  Result Date: 11/19/2020    ECHOCARDIOGRAM REPORT   Patient Name:   [name] Date of Exam: 11/19/2020 Medical Rec #:   [id number]        Height:       65.0 in Accession #:    [id number]       Weight:       91.9 lb Date of Birth:  1950/08/08       BSA:          1.422 m Patient Age:    54 years         BP:           148/69 mmHg Patient Gender: F                HR:           91 bpm. Exam Location:  Inpatient Procedure: 2D Echo, 3D Echo, Cardiac Doppler and Color Doppler Indications:    Chest Pain R07.9  History:        Patient has prior history of Echocardiogram examinations, most                 recent 02/21/2015. TIA and Pulmonary HTN; Arrythmias:Atrial                 Flutter and Tachycardia. GERD. End stage renal disease.  Sonographer:    Darlina Sicilian RDCS Referring Phys: [id number] [place]  1. Left ventricular ejection fraction, by estimation, is 60 to 65%. Left ventricular ejection fraction by 3D volume is 64 %. The left ventricle has normal function. The left ventricle has no regional wall motion abnormalities. Left ventricular diastolic  function could not be evaluated.  2. Right ventricular systolic function is normal. The right ventricular size is normal. Tricuspid regurgitation signal is inadequate for assessing PA pressure.  3. Left atrial size was mildly dilated.  4. The mitral valve is normal in structure. Trivial mitral valve regurgitation. No evidence of mitral stenosis.  5. The aortic valve is tricuspid. Aortic valve regurgitation is not visualized. Mild to moderate aortic valve sclerosis/calcification is present, without any evidence of aortic stenosis.  6. The inferior vena cava is normal in size with greater than 50% respiratory variability, suggesting right atrial pressure of 3 mmHg. FINDINGS  Left Ventricle: Left ventricular ejection fraction, by estimation, is 60 to 65%. Left ventricular ejection fraction by 3D volume is 64 %. The left ventricle has normal function. The left ventricle has no regional wall motion abnormalities. The left ventricular internal cavity size was normal in size.  There is no left ventricular hypertrophy. Left ventricular diastolic function could not be evaluated. Right Ventricle: The right ventricular size is normal. No increase in right ventricular wall thickness. Right ventricular systolic function is normal. Tricuspid regurgitation signal is inadequate for assessing PA pressure. Left Atrium: Left atrial size was mildly dilated. Right Atrium: Right atrial size was normal in size. Pericardium: There is no evidence of pericardial effusion. Mitral Valve: The mitral valve is normal in structure. Trivial mitral valve regurgitation. No evidence of mitral valve stenosis. Tricuspid Valve: The tricuspid valve is normal in structure. Tricuspid valve regurgitation is trivial. No evidence of tricuspid stenosis. Aortic Valve: The  aortic valve is tricuspid. Aortic valve regurgitation is not visualized. Mild to moderate aortic valve sclerosis/calcification is present, without any evidence of aortic stenosis. Pulmonic Valve: The pulmonic valve was normal in structure. Pulmonic valve regurgitation is trivial. No evidence of pulmonic stenosis. Aorta: The aortic root is normal in size and structure. Venous: The inferior vena cava is normal in size with greater than 50% respiratory variability, suggesting right atrial pressure of 3 mmHg. IAS/Shunts: No atrial level shunt detected by color flow Doppler.   3D Volume EF LV 3D EF:    Left ventricular ejection fraction by 3D volume is 64              %.  3D Volume EF: 3D EF:        64 % Fransico Him MD Electronically signed by Fransico Him MD Signature Date/Time: 11/19/2020/3:44:21 PM    Final      Medications:    sodium chloride      vitamin C  500 mg Oral Q12H   calcium acetate  1,334 mg Oral TID WC   Chlorhexidine Gluconate Cloth  6 each Topical Q0600   cinacalcet  60 mg Oral Q T,Th,Sa-HD   clopidogrel  75 mg Oral Q breakfast   darbepoetin (ARANESP) injection - DIALYSIS  100 mcg Intravenous Q Thu-HD   dolutegravir  50 mg Oral q1600    heparin  5,000 Units Subcutaneous Q8H   lamiVUDine  50 mg Oral QAC breakfast   melatonin  3 mg Oral Q24H   mirtazapine  7.5 mg Oral QHS   multivitamin with minerals  1 tablet Oral Q24H   pantoprazole  40 mg Oral BID AC   senna  2 tablet Oral Q24H   sodium chloride flush  3 mL Intravenous Q12H   venlafaxine XR  150 mg Oral Q breakfast   zidovudine  100 mg Oral 3 times per day   sodium chloride, acetaminophen, calcitRIOL, calcium acetate, hydrALAZINE, labetalol, naLOXone (NARCAN)  injection, nitroGLYCERIN, ondansetron (ZOFRAN) IV, ondansetron, sodium chloride flush  Assessment/ Plan:   ESRD:  next dialysis to be 11/21/2020 Volume/ hypertension: . UF as tolerated Anemia of Chronic Kidney Disease: Continue to follow Aranesp initiated in hospital  Secondary Hyperparathyroidism/Hyperphosphatemia: Continue with binders; receives cinacalcet 60mg  and calcitriol 1.58mcg TIW  Vascular access: Right upper extremity fistula with good bruit, slightly pulsatile s/p rt cimino resection + ligation prox/distal + collaterals.. Have been able to cannulate Dispo: pending d/c back to SNF, PT/OT  LOS: [place] @TODAY @8 :22 AM

## 2020-11-20 NOTE — Progress Notes (Signed)
Patient currently in afib. BP 134/67. HR 118-137. Metoprolol PO given. PA paged.   Daymon Larsen, RN

## 2020-11-20 NOTE — Progress Notes (Signed)
Neurology Progress Note   S:// Seen and examined.  See detailed exam below-remains encephalopathy. Unable to provide reliable history or review of systems given her mentation.  O:// Current vital signs: BP (!) 149/84 (BP Location: Right Leg)   Pulse 84   Temp 98.8 F (37.1 C) (Oral)   Resp 18   Ht 5\' 5"  (1.651 m)   Wt 42.8 kg   LMP  (LMP Unknown)   SpO2 92%   BMI 15.70 kg/m  Vital signs in last 24 hours: Temp:  [98 F (36.7 C)-98.8 F (37.1 C)] 98.8 F (37.1 C) (09/21 0728) Pulse Rate:  [69-98] 84 (09/21 0728) Resp:  [13-28] 18 (09/21 0728) BP: (84-198)/(41-115) 149/84 (09/21 0728) SpO2:  [92 %-100 %] 92 % (09/21 0728) Weight:  [41.7 kg-43.3 kg] 42.8 kg (09/21 0644) General: Awake alert no distress HEENT: Normocephalic/atraumatic Lungs: Clear Cardiovascular: Regular rate rhythm Abdomen nondistended nontender Extremities: Right upper extremity swelling with drain in place. Neurological exam She is awake, alert She was able to tell me her name She told me her age to be 70 years old. On repeated questioning, she continued to say her age was 18. She is slow to respond to questions Her attention concentration is poor Simple object naming is inconsistent Simple commands inconsistently followed but cannot follow complex commands Cranial nerves: Pupils equal round react light, extraocular movements appear unhindered, face appears symmetric-she is edentulous. Motor examination: Right upper extremity limited by pain-is able to lift the arm very slightly up against gravity.  Left upper extremity without vertical drift. Both lower extremities are equally weak but are at least 4 -/5. Sensation: Intact to touch Coordination difficult to assess given her mentation  Medications  Current Facility-Administered Medications:    0.9 %  sodium chloride infusion, 250 mL, Intravenous, PRN, Waynetta Sandy, MD   acetaminophen (TYLENOL) tablet 650 mg, 650 mg, Oral, Q4H PRN,  Waynetta Sandy, MD, 650 mg at 11/19/20 1311   ascorbic acid (VITAMIN C) tablet 500 mg, 500 mg, Oral, Q12H, Waynetta Sandy, MD, 500 mg at 11/20/20 5784   calcitRIOL (ROCALTROL) capsule 1.25 mcg, 1.25 mcg, Oral, Q dialysis, Gean Quint, MD, 1.25 mcg at 11/16/20 1350   calcium acetate (PHOSLO) capsule 1,334 mg, 1,334 mg, Oral, TID WC, Waynetta Sandy, MD, 1,334 mg at 11/20/20 0805   calcium acetate (PHOSLO) capsule 667 mg, 667 mg, Oral, PRN, Waynetta Sandy, MD   Chlorhexidine Gluconate Cloth 2 % PADS 6 each, 6 each, Topical, Q0600, Edrick Oh, MD, 6 each at 11/20/20 0539   cinacalcet (SENSIPAR) tablet 60 mg, 60 mg, Oral, Q T,Th,Sa-HD, Gean Quint, MD, 60 mg at 11/16/20 1350   clopidogrel (PLAVIX) tablet 75 mg, 75 mg, Oral, Q breakfast, Waynetta Sandy, MD, 75 mg at 11/20/20 6962   Darbepoetin Alfa (ARANESP) injection 100 mcg, 100 mcg, Intravenous, Q Thu-HD, Gean Quint, MD, 100 mcg at 11/14/20 1155   dolutegravir (TIVICAY) tablet 50 mg, 50 mg, Oral, q1600, Waynetta Sandy, MD, 50 mg at 11/19/20 1557   heparin injection 5,000 Units, 5,000 Units, Subcutaneous, Q8H, Waynetta Sandy, MD, 5,000 Units at 11/20/20 0700   hydrALAZINE (APRESOLINE) injection 5 mg, 5 mg, Intravenous, Q20 Min PRN, Waynetta Sandy, MD   labetalol (NORMODYNE) injection 10 mg, 10 mg, Intravenous, Q10 min PRN, Waynetta Sandy, MD   lamiVUDine (EPIVIR) 10 MG/ML solution 50 mg, 50 mg, Oral, QAC breakfast, Waynetta Sandy, MD, 50 mg at 11/20/20 0806   melatonin tablet 3  mg, 3 mg, Oral, Q24H, Waynetta Sandy, MD, 3 mg at 11/19/20 2108   mirtazapine (REMERON) tablet 7.5 mg, 7.5 mg, Oral, QHS, Waynetta Sandy, MD, 7.5 mg at 11/19/20 2115   multivitamin with minerals tablet 1 tablet, 1 tablet, Oral, Q24H, Waynetta Sandy, MD, 1 tablet at 11/19/20 2059   naloxone [facility]) injection 0.4 mg, 0.4 mg,  Intravenous, PRN, Ulyses Amor, PA-C, 0.4 mg at 11/19/20 6812   nitroGLYCERIN (NITROSTAT) SL tablet 0.4 mg, 0.4 mg, Sublingual, Q5 Min x 3 PRN, Waynetta Sandy, MD   ondansetron [facility]) injection 4 mg, 4 mg, Intravenous, Q6H PRN, Waynetta Sandy, MD   ondansetron [facility]) tablet 4 mg, 4 mg, Oral, Q8H PRN, Waynetta Sandy, MD   pantoprazole (PROTONIX) EC tablet 40 mg, 40 mg, Oral, BID Tyna Jaksch, MD, 40 mg at 11/20/20 7517   senna (SENOKOT) tablet 17.2 mg, 2 tablet, Oral, Q24H, Waynetta Sandy, MD, 17.2 mg at 11/19/20 2059   sodium chloride flush (NS) 0.9 % injection 3 mL, 3 mL, Intravenous, Q12H, Waynetta Sandy, MD, 3 mL at 11/20/20 0807   sodium chloride flush (NS) 0.9 % injection 3 mL, 3 mL, Intravenous, PRN, Waynetta Sandy, MD   venlafaxine XR [facility]) 24 hr capsule 150 mg, 150 mg, Oral, Q breakfast, Waynetta Sandy, MD, 150 mg at 11/20/20 0805   zidovudine (RETROVIR) capsule 100 mg, 100 mg, Oral, 3 times per day, Waynetta Sandy, MD, 100 mg at 11/20/20 0017  Facility-Administered Medications Ordered in Other Encounters:    0.9 %  sodium chloride infusion (Manually program via Guardrails IV Fluids), 250 mL, Intravenous, Once, [place], Randi L, NP-C   acetaminophen (TYLENOL) tablet 650 mg, 650 mg, Oral, Once, Lockamy, Randi L, NP-C   diphenhydrAMINE (BENADRYL) capsule 25 mg, 25 mg, Oral, Once, Lockamy, Randi L, NP-C   sodium chloride flush (NS) 0.9 % injection 10 mL, 10 mL, Intracatheter, PRN, [place], NP-C Labs CBC    Component Value Date/Time   WBC 3.9 (L) 11/19/2020 0125   RBC 2.41 (L) 11/19/2020 0125   HGB 7.7 (L) 11/19/2020 0125   HCT 25.4 (L) 11/19/2020 0125   PLT 243 11/19/2020 0125   MCV 105.4 (H) 11/19/2020 0125   MCH 32.0 11/19/2020 0125   MCHC 30.3 11/19/2020 0125   RDW 15.3 11/19/2020 0125   LYMPHSABS 0.6 (L) 10/18/2019 0941   MONOABS 0.3 10/18/2019 0941    EOSABS 0.1 10/18/2019 0941   BASOSABS 0.0 10/18/2019 0941    CMP     Component Value Date/Time   NA 135 11/19/2020 0125   K 4.2 11/19/2020 0125   CL 97 (L) 11/19/2020 0125   CO2 27 11/19/2020 0125   GLUCOSE 77 11/19/2020 0125   BUN 29 (H) 11/19/2020 0125   CREATININE 8.06 (H) 11/19/2020 0125   CREATININE 5.66 (H) 12/22/2019 1117   CALCIUM 10.1 11/19/2020 0125   CALCIUM 9.0 03/12/2011 0527   PROT 7.9 07/11/2020 0835   ALBUMIN 2.5 (L) 11/18/2020 0334   AST 27 07/11/2020 0835   ALT 6 07/11/2020 0835   ALKPHOS 82 07/11/2020 0835   BILITOT 0.8 07/11/2020 0835   GFRNONAA 5 (L) 11/19/2020 0125   GFRNONAA 5 (L) 02/12/2015 1000   GFRAA 9 (L) 10/22/2019 0105   GFRAA 5 (L) 02/12/2015 1000   Imaging I have reviewed images in epic and the results pertinent to this consultation are: MRI brain with no acute infarction hemorrhage or mass.  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  Assessment: 24 female past medical history of HIV, ESRD, hypertension, ICH, chronic pain, depression, anxiety, atrial flutter, presented for evaluation of right distal forearm fistula necrotic pseudoaneurysm with subtotal occlusion of the innominate stent requiring balloon angioplasty in early September and later conversion of upper arm fistula on the right noted to have altered mental status with spells of staring off and slow responsiveness concerning for for seizure activity because of also some intermittent upper lip twitching and repetitive speech. On my examination she does appear encephalopathic. Her imaging, appears consistent with chronic white matter disease but given the history of HIV, suspect if there is any component of HIV-associated dementia/HIV encephalopathy causing the current mentation in the setting of acute  illness. Ruling out seizures would be imperative and an EEG would be of some help there.  Recommendations: EEG Would recommend checking HIV markers Will follow Preliminary plan discussed with the vascular surgery team on the floor  -- Amie Portland, MD Neurologist [facility] Pager: [phone]

## 2020-11-20 NOTE — Progress Notes (Signed)
Pt HR currently 120s-130s; BP 133/74. EKG done. PA paged and notified.   Daymon Larsen, RN

## 2020-11-20 NOTE — Progress Notes (Signed)
Occupational Therapy Treatment Patient Details Name: [name] MRN: [id number] DOB: May 25, 1950 Today's Date: 11/20/2020   History of present illness Pt is a 71 y.o. female who presented with end-stage renal disease 11/11/20 for right A/V fistulagram. 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 PMH: anemia, anxiety, atrial flutter, C diff, depression, ESRD on HD, HIV, HTN, intracranial hemorrhage, pulmonary HTN, tachycardia, TIA, traumatic hematoma of R UE   OT comments  Pt noted with functional/cognitive decline since initial OT eval. Pt with difficulty following directions and frequent repetition of staff or her own phrases. However, with multimodal cues, pt is able to assist in tasks though requiring extensive assist. Pt required Total A to sit EOB but able to demo sitting balance with Min A at most. With Total A, pt able to come to standing with OT and demo side steps at bedside with consistent verbal cues for sequencing to lift feet. Continue to recommend SNF rehab at [place]. Will continue to follow to progress sequencing, problem solving and OOB participation.    Recommendations for follow up therapy are one component of a multi-disciplinary discharge planning process, led by the attending physician.  Recommendations may be updated based on patient status, additional functional criteria and insurance authorization.    Follow Up Recommendations  SNF;Supervision/Assistance - 24 hour    Equipment Recommendations  None recommended by OT    Recommendations for Other Services      Precautions / Restrictions Precautions Precautions: Fall;Other (comment) Precaution Comments: R UE wound vac Restrictions Weight Bearing Restrictions: No       Mobility Bed Mobility Overal bed mobility: Needs Assistance Bed Mobility: Supine to Sit;Sit to Supine     Supine to sit: Total assist;HOB elevated Sit to supine: Total assist   General bed mobility comments: Pt  agreeable to sit EOB but unable to initiate the motor movement to act on the task.    Transfers Overall transfer level: Needs assistance Equipment used: 1 person hand held assist Transfers: Sit to/from Stand Sit to Stand: Total assist         General transfer comment: Total A for sit to stand at bedside with therapist facing pt and pt holding behind L elbow (due to pain, OT supported R UE in standing). Pt required Max A to maintain balance due to posterior lean but proper posture noted. Max A for side steps at bedside with consistent cues needed for sequencing feet. Improved initiation with second sit to stand though Total A still required    Balance Overall balance assessment: Needs assistance Sitting-balance support: No upper extremity supported;Single extremity supported;Feet supported Sitting balance-Leahy Scale: Poor Sitting balance - Comments: reliant on UE support initially. with time, able to progress to min guard for sitting balance and light Min A to correct posterior lean at times Postural control: Posterior lean Standing balance support: During functional activity;Single extremity supported Standing balance-Leahy Scale: Zero Standing balance comment: Max A to maintain standing balance                           ADL either performed or assessed with clinical judgement   ADL Overall ADL's : Needs assistance/impaired     Grooming: Moderate assistance;Bed level;Wash/dry face Grooming Details (indicate cue type and reason): Mod A overall. unable to successfully reach to face with washcloth using R UE. Placed in L UE with pt able to reach towards face with [facility] assist- wash nose area. Cued to wash  mouth with pt repeating "wash my mouth" but not initiating the movement                               General ADL Comments: Session focused on assessment of functional abilities due to reported functional/cognitive decline from medical team earlier this week.  Limitations due to cognitive deficits and repeating of phrases throughout. able to demo abilty to follow directions for ADLs, standing at bedside though requires increased assist than on initial eval     Vision   Vision Assessment?: No apparent visual deficits   Perception     Praxis      Cognition Arousal/Alertness: Awake/alert Behavior During Therapy: Flat affect Overall Cognitive Status: Impaired/Different from baseline Area of Impairment: Orientation;Attention;Memory;Following commands;Safety/judgement;Problem solving;Awareness                 Orientation Level: Disoriented to;Place;Time;Situation Current Attention Level: Sustained Memory: Decreased short-term memory Following Commands: Follows one step commands inconsistently;Follows one step commands with increased time Safety/Judgement: Decreased awareness of safety;Decreased awareness of deficits Awareness: Intellectual Problem Solving: Slow processing;Decreased initiation;Difficulty sequencing;Requires verbal cues;Requires tactile cues General Comments: Pt able to respond to name. When asked what month it is, reports August. When corrected to September, pt attempted to state date 20th but proceeded to count all the way to 100. Intermittent repeating of phrases said by OT or repeating her own phrases. Slow processing and problem solving. able to initiate tasks at times but requires multimodal cues at other times. Able to follow directions about 50% of the time. Very pleasant demeanor. When asked if she knew she was repeating phrases, pt reports yes and also reported yes when asked if it was uncontrollable        Exercises     Shoulder Instructions       General Comments HR 126bpm    Pertinent Vitals/ Pain       Pain Assessment: Faces Faces Pain Scale: Hurts little more Pain Location: R UE Pain Descriptors / Indicators: Discomfort;Grimacing;Guarding;Operative site guarding Pain Intervention(s): Monitored during  session;Limited activity within patient's tolerance;Repositioned  Home Living       Type of Home: [place]                                  Prior Functioning/Environment              Frequency  Min 2X/week        Progress Toward Goals  OT Goals(current goals can now be found in the care plan section)  Progress towards OT goals: Not progressing toward goals - comment (recent cognitive/functional decline)  Acute Rehab OT Goals Patient Stated Goal: stand again OT Goal Formulation: With patient Time For Goal Achievement: 12/01/20 Potential to Achieve Goals: [place] Discharge plan remains appropriate    Co-evaluation                 AM-PAC OT "6 Clicks" Daily Activity     Outcome Measure   Help from another person eating meals?: A Lot Help from another person taking care of personal grooming?: A Lot Help from another person toileting, which includes using toliet, bedpan, or urinal?: Total Help from another person bathing (including washing, rinsing, drying)?: Total Help from another person to put on and taking off regular upper body clothing?: A Lot Help from another person to put on  and taking off regular lower body clothing?: Total 6 Click Score: 9    End of Session Equipment Utilized During Treatment: Gait belt  OT Visit Diagnosis: Unsteadiness on feet (R26.81);Other abnormalities of gait and mobility (R26.89);Muscle weakness (generalized) (M62.81)   Activity Tolerance Patient tolerated treatment well   Patient Left in bed;with call bell/phone within reach;with bed alarm set   Nurse Communication Mobility status        Time: 0981-1914 OT Time Calculation (min): 28 min  Charges: OT General Charges $OT Visit: 1 Visit OT Treatments $Self Care/Home Management : 8-22 mins $Therapeutic Activity: 8-22 mins  Malachy Chamber, OTR/L Acute Rehab Services Office: [phone]   Layla Maw 11/20/2020, 1:09 PM

## 2020-11-20 NOTE — Progress Notes (Signed)
Initial Nutrition Assessment  DOCUMENTATION CODES:   Underweight, Severe malnutrition in context of chronic illness  INTERVENTION:   Noted no BM x 10 days- recommend strengthening bowel regimen  Ensure Enlive po BID, each supplement provides 350 kcal and 20 grams of protein Magic cup TID with meals, each supplement provides 290 kcal and 9 grams of protein Renal MVI daily   NUTRITION DIAGNOSIS:   Severe Malnutrition related to chronic illness (HIV/ESRD) as evidenced by severe fat depletion, severe muscle depletion.  GOAL:   Patient will meet greater than or equal to 90% of their needs  MONITOR:   PO intake, Weight trends, TF tolerance, Labs, I & O's, Skin  REASON FOR ASSESSMENT:   Malnutrition Screening Tool    ASSESSMENT:   Patient with PMH significant for chronic diarrhea, ESRD on HD, GERD, HIV, HTN, pulmonary HTN, and traumatic hematoma or R upper arm. Presents this admission with R upper extremity necrotic pseudoaneurysm with concern for infection.  9/12- R upper extremity fistulogram, angioplasty R subclavian  9/13- s/p ligation/resection of R arm radiocephalic AVF, creation of cephalic AVF  Wound VAC removed today. Checking HIV markers. RD attempted to obtain history. Upon introduction, patient continued to repeat what RD was saying. Unable to answer questions. Noted ongoing confusion. Will attempt to obtain further history as mental status improves. Last meal completion charted as 100%. SLP assessed patient and recommended a DYS 2 diet with thin liquids. Was given permission to have cheetos and fritos with full nursing supervision. RD to provide supplementation to maximize kcal and protein.   Weight noted to decline over the last 6 months. She is currently under dry weight causing concern for dry weight loss. Unable to quantify given history of ESRD.   Admission weight: 46 kg Current weight: 42.8 kg  Medications: Vitamin C 500 mg BID, phoslo, sensipar, aranesp,  tivicay, remeron, senokot Labs: CBG 74-94   NUTRITION - FOCUSED PHYSICAL EXAM:  Flowsheet Row Most Recent Value  Orbital Region Severe depletion  Upper Arm Region Severe depletion  Thoracic and Lumbar Region Severe depletion  Buccal Region Severe depletion  Temple Region Severe depletion  Clavicle Bone Region Severe depletion  Clavicle and Acromion Bone Region Severe depletion  Scapular Bone Region Severe depletion  Dorsal Hand Severe depletion  Patellar Region Severe depletion  Anterior Thigh Region Severe depletion  Posterior Calf Region Severe depletion  Edema (RD Assessment) None  Hair Reviewed  Eyes Reviewed  Mouth Unable to assess  Skin Reviewed  Nails Reviewed      Diet Order:   Diet Order             DIET DYS 2 Room service appropriate? Yes with Assist; Fluid consistency: Thin  Diet effective now                   EDUCATION NEEDS:   Not appropriate for education at this time  Skin:  Skin Assessment: Skin Integrity Issues: Skin Integrity Issues:: Other (Comment) Other: pressure injury- sacrum, R arm wound s/p surgery  Last BM:  9/10?  Height:   Ht Readings from Last 1 Encounters:  11/12/20 5\' 5"  (1.651 m)    Weight:   Wt Readings from Last 1 Encounters:  11/20/20 42.8 kg    BMI:  Body mass index is 15.7 kg/m.  Estimated Nutritional Needs:   Kcal:  1400-1600 kcal  Protein:  75-90 grams  Fluid:  1000 ml + UOP  Deanta Mincey MS, RD, LDN, CNSC Clinical Nutrition Pager listed  in [facility]

## 2020-11-20 NOTE — Progress Notes (Signed)
Physical Therapy Treatment Patient Details Name: [name] MRN: [id number] DOB: [birth year] Today's Date: 11/20/2020   History of Present Illness Pt is a 70 y.o. female who presented with end-stage renal disease 11/11/20 for right A/V fistulagram. 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 Pt with decline during admission, MRI negative for acute intracranial process, awaiting EEG results. PMH: anemia, anxiety, atrial flutter, C diff, depression, ESRD on HD, HIV, HTN, intracranial hemorrhage, pulmonary HTN, tachycardia, TIA, traumatic hematoma of R UE    PT Comments    Pt perseverating on comments throughout session and began to display intermittent bil shoulder simultaneous twitching after her first gait bout, but pt was able to follow PT with eyes and respond to questions during, RN made aware. Pt was able to progress to ambulating x3 short bouts with a RW and modA this date, quickly fatiguing with each bout. Will continue to follow acutely. Current recommendations remain appropriate.   Recommendations for follow up therapy are one component of a multi-disciplinary discharge planning process, led by the attending physician.  Recommendations may be updated based on patient status, additional functional criteria and insurance authorization.  Follow Up Recommendations  SNF;Supervision/Assistance - 24 hour     Equipment Recommendations  None recommended by PT    Recommendations for Other Services       Precautions / Restrictions Precautions Precautions: Fall;Other (comment) Precaution Comments: R UE wound vac Restrictions Weight Bearing Restrictions: No     Mobility  Bed Mobility Overal bed mobility: Needs Assistance Bed Mobility: Supine to Sit;Sit to Supine     Supine to sit: HOB elevated;Mod assist Sit to supine: Max assist   General bed mobility comments: Pt able to initiate legs towards EOB when cued, needing modA to boost trunk, pivot  buttocks, and scoot hips to EOB. MaxA to manage legs and trunk back to supine.    Transfers Overall transfer level: Needs assistance Equipment used: Rolling walker (2 wheeled) Transfers: Sit to/from Stand Sit to Stand: Mod assist         General transfer comment: Sit to stand 3x from EOB with R hand on RW and L pushing up from EOB, modA to transition anteriorly and boost to stand.  Ambulation/Gait Ambulation/Gait assistance: Mod assist Gait Distance (Feet): 8 Feet (x3 bouts ~6 ft > ~8 ft > ~8 ft) Assistive device: Rolling walker (2 wheeled) Gait Pattern/deviations: Step-through pattern;Decreased stride length;Shuffle;Decreased weight shift to left;Decreased weight shift to right;Leaning posteriorly;Narrow base of support Gait velocity: reduced Gait velocity interpretation: <1.31 ft/sec, indicative of household ambulator General Gait Details: Pt with short, shuffling steps and slight posterior lean. Cues provided to widen stance with fair carryover. Tactile cues for weight shifting with anterior and posterior steps at EOB with RW.   Stairs             Wheelchair Mobility    Modified Rankin (Stroke Patients Only)       Balance Overall balance assessment: Needs assistance Sitting-balance support: Feet supported;Bilateral upper extremity supported Sitting balance-Leahy Scale: Poor Sitting balance - Comments: reliant on UE support with min guard-minA to sit statically EOB Postural control: Posterior lean Standing balance support: During functional activity;Bilateral upper extremity supported Standing balance-Leahy Scale: Poor Standing balance comment: ModA and bil UE support to mobilize.                            Cognition Arousal/Alertness: Awake/alert Behavior During Therapy: Flat affect Overall Cognitive  Status: Impaired/Different from baseline Area of Impairment: Attention;Memory;Following commands;Safety/judgement;Problem solving;Awareness                    Current Attention Level: Sustained Memory: Decreased short-term memory Following Commands: Follows one step commands inconsistently;Follows one step commands with increased time Safety/Judgement: Decreased awareness of safety;Decreased awareness of deficits Awareness: Intellectual Problem Solving: Slow processing;Decreased initiation;Difficulty sequencing;Requires verbal cues;Requires tactile cues General Comments: Pt perseverating on statements made by her or PT, repeating "I love Caryl Pina" and "I will sit up" etc. Pt with occasional twitch of bil shoulders simultaneously (began after first gait bout), but would maintain eye contact, follow PT with eyes, and repond to questions during intermittent twitches, RN aware. Pt needs extra time and multi-modal simple cues for tasks.      Exercises      General Comments General comments (skin integrity, edema, etc.): HR up to 117 bpm; RN and EEG tech made aware of shoulder twitching but pt still responding to PT during and on perseverating comments      Pertinent Vitals/Pain Pain Assessment: Faces Faces Pain Scale: Hurts little more Pain Location: R UE Pain Descriptors / Indicators: Discomfort;Grimacing;Guarding;Operative site guarding Pain Intervention(s): Limited activity within patient's tolerance;Monitored during session;Repositioned    Home Living                      Prior Function            PT Goals (current goals can now be found in the care plan section) Acute Rehab PT Goals Patient Stated Goal: to walk PT Goal Formulation: With patient Time For Goal Achievement: 12/01/20 Potential to Achieve Goals: Fair Progress towards PT goals: Progressing toward goals    Frequency    Min 2X/week      PT Plan Current plan remains appropriate    Co-evaluation              AM-PAC PT "6 Clicks" Mobility   Outcome Measure  Help needed turning from your back to your side while in a flat bed without  using bedrails?: A Lot Help needed moving from lying on your back to sitting on the side of a flat bed without using bedrails?: A Lot Help needed moving to and from a bed to a chair (including a wheelchair)?: A Lot Help needed standing up from a chair using your arms (e.g., wheelchair or bedside chair)?: A Lot Help needed to walk in hospital room?: A Lot Help needed climbing 3-5 steps with a railing? : Total 6 Click Score: 11    End of Session Equipment Utilized During Treatment: Gait belt Activity Tolerance: Patient tolerated treatment well Patient left: in bed;with call bell/phone within reach;with nursing/sitter in room;Other (comment) (getting hooked up for EEG) Nurse Communication: Mobility status;Other (comment) (twitching, HR, perseveration) PT Visit Diagnosis: Unsteadiness on feet (R26.81);Other abnormalities of gait and mobility (R26.89);Muscle weakness (generalized) (M62.81);History of falling (Z91.81);Difficulty in walking, not elsewhere classified (R26.2);Pain Pain - Right/Left: Right Pain - part of body: Arm     Time: 6160-7371 PT Time Calculation (min) (ACUTE ONLY): 24 min  Charges:  $Gait Training: 23-37 mins                     Moishe Spice, PT, DPT Acute Rehabilitation Services  Pager: [phone] Office: [phone]    Orvan Falconer 11/20/2020, 5:22 PM

## 2020-11-21 DIAGNOSIS — N186 End stage renal disease: Secondary | ICD-10-CM | POA: Diagnosis not present

## 2020-11-21 DIAGNOSIS — R079 Chest pain, unspecified: Secondary | ICD-10-CM | POA: Diagnosis not present

## 2020-11-21 DIAGNOSIS — I4892 Unspecified atrial flutter: Secondary | ICD-10-CM

## 2020-11-21 LAB — CBC
HCT: 31.2 % — ABNORMAL LOW (ref 36.0–46.0)
HCT: 27.8 % — ABNORMAL LOW (ref 36.0–46.0)
Hemoglobin: 9.5 g/dL — ABNORMAL LOW (ref 12.0–15.0)
Hemoglobin: 8.2 g/dL — ABNORMAL LOW (ref 12.0–15.0)
MCH: 32.1 pg (ref 26.0–34.0)
MCH: 31.7 pg (ref 26.0–34.0)
MCHC: 30.4 g/dL (ref 30.0–36.0)
MCHC: 29.5 g/dL — ABNORMAL LOW (ref 30.0–36.0)
MCV: 105.4 fL — ABNORMAL HIGH (ref 80.0–100.0)
MCV: 107.3 fL — ABNORMAL HIGH (ref 80.0–100.0)
Platelets: 350 10*3/uL (ref 150–400)
Platelets: 328 10*3/uL (ref 150–400)
RBC: 2.96 MIL/uL — ABNORMAL LOW (ref 3.87–5.11)
RBC: 2.59 MIL/uL — ABNORMAL LOW (ref 3.87–5.11)
RDW: 16.2 % — ABNORMAL HIGH (ref 11.5–15.5)
RDW: 16.5 % — ABNORMAL HIGH (ref 11.5–15.5)
WBC: 9.4 10*3/uL (ref 4.0–10.5)
WBC: 10.9 10*3/uL — ABNORMAL HIGH (ref 4.0–10.5)
nRBC: 0 % (ref 0.0–0.2)
nRBC: 0 % (ref 0.0–0.2)

## 2020-11-21 LAB — AMMONIA: Ammonia: 13 umol/L (ref 9–35)

## 2020-11-21 LAB — RENAL FUNCTION PANEL
Albumin: 3 g/dL — ABNORMAL LOW (ref 3.5–5.0)
Anion gap: 14 (ref 5–15)
BUN: 39 mg/dL — ABNORMAL HIGH (ref 8–23)
CO2: 27 mmol/L (ref 22–32)
Calcium: 10.2 mg/dL (ref 8.9–10.3)
Chloride: 97 mmol/L — ABNORMAL LOW (ref 98–111)
Creatinine, Ser: 7.92 mg/dL — ABNORMAL HIGH (ref 0.44–1.00)
GFR, Estimated: 5 mL/min — ABNORMAL LOW (ref >60)
Glucose, Bld: 122 mg/dL — ABNORMAL HIGH (ref 70–99)
Phosphorus: 2.6 mg/dL (ref 2.5–4.6)
Potassium: 3.9 mmol/L (ref 3.5–5.1)
Sodium: 138 mmol/L (ref 135–145)

## 2020-11-21 LAB — COMPREHENSIVE METABOLIC PANEL
ALT: 11 U/L (ref 0–44)
AST: 20 U/L (ref 15–41)
Albumin: 3.2 g/dL — ABNORMAL LOW (ref 3.5–5.0)
Alkaline Phosphatase: 83 U/L (ref 38–126)
Anion gap: 15 (ref 5–15)
BUN: 34 mg/dL — ABNORMAL HIGH (ref 8–23)
CO2: 27 mmol/L (ref 22–32)
Calcium: 10.6 mg/dL — ABNORMAL HIGH (ref 8.9–10.3)
Chloride: 95 mmol/L — ABNORMAL LOW (ref 98–111)
Creatinine, Ser: 7.69 mg/dL — ABNORMAL HIGH (ref 0.44–1.00)
GFR, Estimated: 5 mL/min — ABNORMAL LOW (ref >60)
Glucose, Bld: 137 mg/dL — ABNORMAL HIGH (ref 70–99)
Potassium: 3.6 mmol/L (ref 3.5–5.1)
Sodium: 137 mmol/L (ref 135–145)
Total Bilirubin: 0.6 mg/dL (ref 0.3–1.2)
Total Protein: 8.5 g/dL — ABNORMAL HIGH (ref 6.5–8.1)

## 2020-11-21 LAB — GLUCOSE, CAPILLARY
Glucose-Capillary: 168 mg/dL — ABNORMAL HIGH (ref 70–99)
Glucose-Capillary: 143 mg/dL — ABNORMAL HIGH (ref 70–99)

## 2020-11-21 LAB — CD4/CD8 (T-HELPER/T-SUPPRESSOR CELL)
CD4 absolute: 252 /uL — ABNORMAL LOW (ref 400–1790)
CD4%: 26.9 % — ABNORMAL LOW (ref 33–65)
CD8 T Cell Abs: 415 /uL (ref 190–1000)
CD8tox: 44.28 % — ABNORMAL HIGH (ref 12–40)
Ratio: 0.61 — ABNORMAL LOW (ref 1.0–3.0)
Total lymphocyte count: 938 /uL — ABNORMAL LOW (ref 1000–4000)

## 2020-11-21 LAB — HEPARIN LEVEL (UNFRACTIONATED)
Heparin Unfractionated: 0.65 IU/mL (ref 0.30–0.70)
Heparin Unfractionated: 0.57 IU/mL (ref 0.30–0.70)

## 2020-11-21 LAB — HIV-1 RNA QUANT-NO REFLEX-BLD
HIV 1 RNA Quant: <20 copies/mL
LOG10 HIV-1 RNA: UNDETERMINED log10copy/mL

## 2020-11-21 MED ORDER — PENTAFLUOROPROP-TETRAFLUOROETH EX AERO: 1.0000 "application " | INHALATION_SPRAY | CUTANEOUS | Status: DC | PRN

## 2020-11-21 MED ORDER — LIDOCAINE-PRILOCAINE 2.5-2.5 % EX CREA: 1.0000 "application " | TOPICAL_CREAM | CUTANEOUS | Status: DC | PRN

## 2020-11-21 MED ORDER — HEPARIN SODIUM (PORCINE) 1000 UNIT/ML DIALYSIS: 1000.0000 [IU] | INTRAMUSCULAR | Status: DC | PRN

## 2020-11-21 MED ORDER — SODIUM CHLORIDE 0.9 % IV SOLN: 100.0000 mL | INTRAVENOUS | Status: DC | PRN

## 2020-11-21 MED ORDER — LIDOCAINE HCL (PF) 1 % IJ SOLN: 5.0000 mL | INTRAMUSCULAR | Status: DC | PRN

## 2020-11-21 MED ORDER — ALTEPLASE 2 MG IJ SOLR: 2.0000 mg | Freq: Once | INTRAMUSCULAR | Status: DC | PRN

## 2020-11-21 NOTE — Progress Notes (Signed)
[place] for Heparin Indication: atrial fibrillation  Allergies  Allergen Reactions   Lactose Intolerance (Gi) Other (See Comments)    On MAR   Latex Rash and Itching   Penicillins Rash and Swelling    Has patient had a PCN reaction causing immediate rash, facial/tongue/throat swelling, SOB or lightheadedness with hypotension: No Has patient had a PCN reaction causing severe rash involving mucus membranes or skin necrosis: No Has patient had a PCN reaction that required hospitalization No Has patient had a PCN reaction occurring within the last 10 years: No If all of the above answers are "NO", then may proceed with Cephalosporin use.      Patient Measurements: Height: 5\' 5"  (165.1 cm) Weight: 42.8 kg (94 lb 5.7 oz) IBW/kg (Calculated) : 57  Heparin Dosing Weight: 47.7 kg  Vital Signs: Temp: 98.2 F (36.8 C) (09/22 1126) Temp Source: Oral (09/22 1126) BP: 142/74 (09/22 1126) Pulse Rate: 86 (09/22 1126)  Labs: Recent Labs    11/18/20 1505 11/18/20 1634 11/19/20 0125 11/21/20 0453 11/21/20 0935 11/21/20 1216  HGB  --   --  7.7* 9.5*  --   --   HCT  --   --  25.4* 31.2*  --   --   PLT  --   --  243 350  --   --   HEPARINUNFRC  --   --   --  0.65  --  0.57  CREATININE  --   --  8.06*  --  7.69*  --   TROPONINIHS 44* 44*  --   --   --   --      Estimated Creatinine Clearance: 4.8 mL/min (A) (by C-G formula based on SCr of 7.69 mg/dL (H)).   Medical History: Past Medical History:  Diagnosis Date   Anemia    Anxiety    Atrial flutter ([place]) 02/22/2015   C. difficile colitis 10/10/11   Chronic diarrhea    Chronic pain    Depression    ESRD on hemodialysis [facility])    Dialysis [name]   GERD (gastroesophageal reflux disease)    HIV (human immunodeficiency virus infection) ([place])    Hypertension    Insomnia    Intracranial hemorrhage ([facility])    Muscle weakness (generalized)    Pulmonary HTN ([facility])    Tachycardia    TIA  (transient ischemic attack)    [place] do not have this dx   Traumatic hematoma of right upper arm 02/22/2015    Medications:  Scheduled:   vitamin C  500 mg Oral Q12H   calcium acetate  1,334 mg Oral TID WC   Chlorhexidine Gluconate Cloth  6 each Topical Q0600   cinacalcet  60 mg Oral Q T,Th,Sa-HD   clopidogrel  75 mg Oral Q breakfast   darbepoetin (ARANESP) injection - DIALYSIS  100 mcg Intravenous Q Thu-HD   dolutegravir  50 mg Oral q1600   feeding supplement  237 mL Oral BID BM   lamiVUDine  50 mg Oral QAC breakfast   melatonin  3 mg Oral Q24H   metoprolol tartrate  25 mg Oral BID   mirtazapine  7.5 mg Oral QHS   multivitamin  1 tablet Oral QHS   pantoprazole  40 mg Oral BID AC   senna  2 tablet Oral Q24H   sodium chloride flush  3 mL Intravenous Q12H   venlafaxine XR  150 mg Oral Q breakfast   zidovudine  100 mg Oral 3 times per day   Infusions:   sodium chloride     sodium chloride     sodium chloride     heparin 700 Units/hr (11/20/20 2058)    Assessment: 70 y.o. female with a history of isolated episode of atrial flutter in 2016 not on anticoagulation, prior TIA, PAD with prior stenting to right innominate artery, hypertension, ESRD on hemodialysis, thrombocytopenia, GERD, HIV, and chronic pain who is being seen for the evaluation of chest pain now found to be in Aflutter w/ RVR. Pharmacy is consulted to initiate IV heparin for anticoagulation.   No anticoagulation prior to admission. Patient has anemia at baseline. Will conservatively dose heparin with lower bolus and watch CBC closely. No s/sx bleeding currently per chart review.   Heparin level this afternoon remains at goal level.  No overt bleeding or complications noted. Hgb up today.  Goal of Therapy:  Heparin level 0.3-0.7 units/ml Monitor platelets by anticoagulation protocol: Yes   Plan:  Continue IV heparin at current rate of 700 units/hr. F/u ability to transition to Eliquis once  no more vascular procedures planned. Daily heparin level and CBC.  [place] Crane, Roylene Reason, BCCP Clinical Pharmacist  11/21/2020 1:44 PM   [facility] pharmacy phone numbers are listed on [email or web address]

## 2020-11-21 NOTE — Progress Notes (Addendum)
  Progress Note    11/21/2020 7:30 AM 9 Days Post-Op  Subjective:  says she doesn't feel good this morning.  c/o pain in the left leg and where they gave her shot in her stomach  Afebrile HR 80's-120's  655'V-748'O systolic 70% RA  Vitals:   11/20/20 2315 11/21/20 0455  BP: 123/77 (!) 142/70  Pulse: 76 92  Resp: 20 20  Temp: 98.7 F (37.1 C) 98.2 F (36.8 C)  SpO2: 98% 100%    Physical Exam: General:  resting comfortably Lungs:  non labored Incisions:  right arm with wound vac in place; painful to lift arm.  Extremities:  BLE with motor and sensation in tact; no pain in either foot.  Abdomen:  soft, NT to palpation  CBC    Component Value Date/Time   WBC 9.4 11/21/2020 0453   RBC 2.96 (L) 11/21/2020 0453   HGB 9.5 (L) 11/21/2020 0453   HCT 31.2 (L) 11/21/2020 0453   PLT 350 11/21/2020 0453   MCV 105.4 (H) 11/21/2020 0453   MCH 32.1 11/21/2020 0453   MCHC 30.4 11/21/2020 0453   RDW 16.2 (H) 11/21/2020 0453   LYMPHSABS 0.6 (L) 10/18/2019 0941   MONOABS 0.3 10/18/2019 0941   EOSABS 0.1 10/18/2019 0941   BASOSABS 0.0 10/18/2019 0941    BMET    Component Value Date/Time   NA 135 11/19/2020 0125   K 4.2 11/19/2020 0125   CL 97 (L) 11/19/2020 0125   CO2 27 11/19/2020 0125   GLUCOSE 77 11/19/2020 0125   BUN 29 (H) 11/19/2020 0125   CREATININE 8.06 (H) 11/19/2020 0125   CREATININE 5.66 (H) 12/22/2019 1117   CALCIUM 10.1 11/19/2020 0125   CALCIUM 9.0 03/12/2011 0527   GFRNONAA 5 (L) 11/19/2020 0125   GFRNONAA 5 (L) 02/12/2015 1000   GFRAA 9 (L) 10/22/2019 0105   GFRAA 5 (L) 02/12/2015 1000    INR    Component Value Date/Time   INR 1.14 12/28/2011 1403    No intake or output data in the 24 hours ending 11/21/20 0730   Assessment/Plan:  70 y.o. 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  Now with vacuum assisted dressing to right forearm   9 Days Post-Op   -pt was  complaining of pain in her right leg-BP cuff in place.  She does not have any pain in her feet and motor and sensory are in tact.   -continue wound vac-for change tomorrow -pt went into aflutter yesterday and heparin gtt was started -pt more awake this morning.   -abdomen is soft and not tender to palpation    Leontine Locket, PA-C [facility] [phone] 11/21/2020 7:30 AM   I have independently interviewed and examined patient and agree with PA assessment and plan above.   Tulip Meharg C. Donzetta Matters, MD [facility] [place] Office: [phone] Pager: [phone]

## 2020-11-21 NOTE — Progress Notes (Signed)
Neurology Progress Note   S:// Seen and examined Still remains confused per report   O:// Current vital signs: BP 113/80 (BP Location: Right Leg)   Pulse 91   Temp 98.5 F (36.9 C) (Oral)   Resp 20   Ht 5\' 5"  (1.651 m)   Wt 42.8 kg   LMP  (LMP Unknown)   SpO2 98%   BMI 15.70 kg/m  Vital signs in last 24 hours: Temp:  [98 F (36.7 C)-98.7 F (37.1 C)] 98.5 F (36.9 C) (09/22 2751) Pulse Rate:  [76-118] 91 (09/22 0822) Resp:  [15-20] 20 (09/22 0822) BP: (113-142)/(70-86) 113/80 (09/22 0822) SpO2:  [94 %-100 %] 98 % (09/22 0822) General: Awake alert in no distress, cachectic HEENT: Normocephalic/atraumatic CVS: Regular rate rhythm Abdomen nondistended nontender Neurological Awake alert oriented to self, the fact that she is in the hospital. Got the month wrong-12 needs August 20 September. Was able to tell me the current president correctly Was able to tell me her correct age Attention concentration is diminished Speech is mildly dysarthric-she is also edentulous Cranial nerves II to XII intact Motor exam with pain in the right upper extremity precluding good exam otherwise antigravity. Sensation intact to touch No obvious dysmetria   Medications  Current Facility-Administered Medications:    0.9 %  sodium chloride infusion, 250 mL, Intravenous, PRN, Waynetta Sandy, MD   acetaminophen (TYLENOL) tablet 650 mg, 650 mg, Oral, Q4H PRN, Waynetta Sandy, MD, 650 mg at 11/19/20 1311   ascorbic acid (VITAMIN C) tablet 500 mg, 500 mg, Oral, Q12H, Waynetta Sandy, MD, 500 mg at 11/21/20 7001   calcitRIOL (ROCALTROL) capsule 1.25 mcg, 1.25 mcg, Oral, Q dialysis, Gean Quint, MD, 1.25 mcg at 11/16/20 1350   calcium acetate (PHOSLO) capsule 1,334 mg, 1,334 mg, Oral, TID WC, Waynetta Sandy, MD, 1,334 mg at 11/21/20 7494   calcium acetate (PHOSLO) capsule 667 mg, 667 mg, Oral, PRN, Waynetta Sandy, MD   Chlorhexidine Gluconate  Cloth 2 % PADS 6 each, 6 each, Topical, Q0600, Edrick Oh, MD, 6 each at 11/21/20 0546   cinacalcet (SENSIPAR) tablet 60 mg, 60 mg, Oral, Q T,Th,Sa-HD, Gean Quint, MD, 60 mg at 11/16/20 1350   clopidogrel (PLAVIX) tablet 75 mg, 75 mg, Oral, Q breakfast, Waynetta Sandy, MD, 75 mg at 11/21/20 4967   Darbepoetin Alfa (ARANESP) injection 100 mcg, 100 mcg, Intravenous, Q Thu-HD, Gean Quint, MD, 100 mcg at 11/14/20 1155   dolutegravir (TIVICAY) tablet 50 mg, 50 mg, Oral, q1600, Waynetta Sandy, MD, 50 mg at 11/20/20 1705   feeding supplement (ENSURE ENLIVE / ENSURE PLUS) liquid 237 mL, 237 mL, Oral, BID BM, Waynetta Sandy, MD, 237 mL at 11/20/20 1440   heparin ADULT infusion 100 units/mL (25000 units/211mL), 700 Units/hr, Intravenous, Continuous, Mignon Pine, RPH, Last Rate: 7 mL/hr at 11/20/20 2058, 700 Units/hr at 11/20/20 2058   hydrALAZINE (APRESOLINE) injection 5 mg, 5 mg, Intravenous, Q20 Min PRN, Waynetta Sandy, MD   lamiVUDine (EPIVIR) 10 MG/ML solution 50 mg, 50 mg, Oral, QAC breakfast, Waynetta Sandy, MD, 50 mg at 11/21/20 5916   melatonin tablet 3 mg, 3 mg, Oral, Q24H, Waynetta Sandy, MD, 3 mg at 11/20/20 2100   metoprolol tartrate (LOPRESSOR) injection 5 mg, 5 mg, Intravenous, Q6H PRN, Kroeger, Krista M., PA-C   metoprolol tartrate (LOPRESSOR) tablet 25 mg, 25 mg, Oral, BID, Kroeger, Krista M., PA-C, 25 mg at 11/21/20 0924   mirtazapine (REMERON) tablet 7.5 mg,  7.5 mg, Oral, QHS, Waynetta Sandy, MD, 7.5 mg at 11/20/20 2103   multivitamin (RENA-VIT) tablet 1 tablet, 1 tablet, Oral, QHS, Waynetta Sandy, MD, 1 tablet at 11/20/20 2103   naloxone [facility]) injection 0.4 mg, 0.4 mg, Intravenous, PRN, Laurence Slate M, PA-C, 0.4 mg at 11/19/20 0737   nitroGLYCERIN (NITROSTAT) SL tablet 0.4 mg, 0.4 mg, Sublingual, Q5 Min x 3 PRN, Waynetta Sandy, MD   ondansetron [facility]) injection 4 mg, 4 mg,  Intravenous, Q6H PRN, Waynetta Sandy, MD, 4 mg at 11/21/20 0946   ondansetron [facility]) tablet 4 mg, 4 mg, Oral, Q8H PRN, Waynetta Sandy, MD   pantoprazole (PROTONIX) EC tablet 40 mg, 40 mg, Oral, BID AC, Waynetta Sandy, MD, 40 mg at 11/21/20 1062   senna (SENOKOT) tablet 17.2 mg, 2 tablet, Oral, Q24H, Waynetta Sandy, MD, 17.2 mg at 11/20/20 2103   sodium chloride flush (NS) 0.9 % injection 3 mL, 3 mL, Intravenous, Q12H, Waynetta Sandy, MD, 3 mL at 11/21/20 0940   sodium chloride flush (NS) 0.9 % injection 3 mL, 3 mL, Intravenous, PRN, Waynetta Sandy, MD   venlafaxine XR [facility]) 24 hr capsule 150 mg, 150 mg, Oral, Q breakfast, Waynetta Sandy, MD, 150 mg at 11/21/20 6948   zidovudine (RETROVIR) capsule 100 mg, 100 mg, Oral, 3 times per day, Waynetta Sandy, MD, 100 mg at 11/20/20 2104  Facility-Administered Medications Ordered in Other Encounters:    0.9 %  sodium chloride infusion (Manually program via Guardrails IV Fluids), 250 mL, Intravenous, Once, Lockamy, Randi L, NP-C   acetaminophen (TYLENOL) tablet 650 mg, 650 mg, Oral, Once, Lockamy, Randi L, NP-C   diphenhydrAMINE (BENADRYL) capsule 25 mg, 25 mg, Oral, Once, Lockamy, Randi L, NP-C   sodium chloride flush (NS) 0.9 % injection 10 mL, 10 mL, Intracatheter, PRN, Lockamy, Randi L, NP-C Labs CBC    Component Value Date/Time   WBC 9.4 11/21/2020 0453   RBC 2.96 (L) 11/21/2020 0453   HGB 9.5 (L) 11/21/2020 0453   HCT 31.2 (L) 11/21/2020 0453   PLT 350 11/21/2020 0453   MCV 105.4 (H) 11/21/2020 0453   MCH 32.1 11/21/2020 0453   MCHC 30.4 11/21/2020 0453   RDW 16.2 (H) 11/21/2020 0453   LYMPHSABS 0.6 (L) 10/18/2019 0941   MONOABS 0.3 10/18/2019 0941   EOSABS 0.1 10/18/2019 0941   BASOSABS 0.0 10/18/2019 0941    CMP     Component Value Date/Time   NA 137 11/21/2020 0935   K 3.6 11/21/2020 0935   CL 95 (L) 11/21/2020 0935   CO2 27  11/21/2020 0935   GLUCOSE 137 (H) 11/21/2020 0935   BUN 34 (H) 11/21/2020 0935   CREATININE 7.69 (H) 11/21/2020 0935   CREATININE 5.66 (H) 12/22/2019 1117   CALCIUM 10.6 (H) 11/21/2020 0935   CALCIUM 9.0 03/12/2011 0527   PROT 8.5 (H) 11/21/2020 0935   ALBUMIN 3.2 (L) 11/21/2020 0935   AST 20 11/21/2020 0935   ALT 11 11/21/2020 0935   ALKPHOS 83 11/21/2020 0935   BILITOT 0.6 11/21/2020 0935   GFRNONAA 5 (L) 11/21/2020 0935   GFRNONAA 5 (L) 02/12/2015 1000   GFRAA 9 (L) 10/22/2019 0105   GFRAA 5 (L) 02/12/2015 1000    Imaging I have reviewed images in epic and the results pertinent to this consultation are: No new imaging  EEG with moderate to severe diffuse slowing indicating global cerebral dysfunction  Assessment:  70 year old female past history of HIV,  ESRD, hypertension, prior ICH, chronic pain, depression anxiety atrial flutter presented for evaluation of right distal forearm fistula requiring surgical intervention noted to be altered with spells of staring off and slow responsiveness concerning for seizure activity.  Also some intermittent upper lip twitching and repetitive speech noted. She does appear mildly encephalopathic but was more oriented today than she had been in the past. 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 HIV markers are pending  Recommendations: Ordered LFT and ammonia-normal AST ALT and ammonia.  Decreased total bilirubin. EEG consistent with encephalopathy-likely toxic metabolic. Are examined electrographic findings are not consistent with ongoing seizures status epilepticus. I suspect that there is a component of underlying cognitive decline due to her medical comorbidities which is being exacerbated in the setting of acute illness. She will need outpatient formal neuropsychological testing. No further inpatient neurological work-up at this time.  Please  continue optimal medical management as you are.   -- Amie Portland, MD Neurologist [facility] Pager: [phone]

## 2020-11-21 NOTE — Progress Notes (Signed)
Progress Note  Patient Name: TAREN DYMEK Date of Encounter: 11/21/2020  [facility] Cardiologist: Dorris Carnes, MD   Subjective   Remains confused, denies any chest pain  Inpatient Medications    Scheduled Meds:  vitamin C  500 mg Oral Q12H   calcium acetate  1,334 mg Oral TID WC   Chlorhexidine Gluconate Cloth  6 each Topical Q0600   cinacalcet  60 mg Oral Q T,Th,Sa-HD   clopidogrel  75 mg Oral Q breakfast   darbepoetin (ARANESP) injection - DIALYSIS  100 mcg Intravenous Q Thu-HD   dolutegravir  50 mg Oral q1600   feeding supplement  237 mL Oral BID BM   lamiVUDine  50 mg Oral QAC breakfast   melatonin  3 mg Oral Q24H   metoprolol tartrate  25 mg Oral BID   mirtazapine  7.5 mg Oral QHS   multivitamin  1 tablet Oral QHS   pantoprazole  40 mg Oral BID AC   senna  2 tablet Oral Q24H   sodium chloride flush  3 mL Intravenous Q12H   venlafaxine XR  150 mg Oral Q breakfast   zidovudine  100 mg Oral 3 times per day   Continuous Infusions:  sodium chloride     heparin 700 Units/hr (11/20/20 2058)   PRN Meds: sodium chloride, acetaminophen, calcitRIOL, calcium acetate, hydrALAZINE, metoprolol tartrate, naLOXone (NARCAN)  injection, nitroGLYCERIN, ondansetron (ZOFRAN) IV, ondansetron, sodium chloride flush   Vital Signs    Vitals:   11/20/20 2035 11/20/20 2315 11/21/20 0455 11/21/20 0822  BP: 138/75 123/77 (!) 142/70 113/80  Pulse: (!) 118 76 92 91  Resp: 20 20 20 20   Temp: 98.7 F (37.1 C) 98.7 F (37.1 C) 98.2 F (36.8 C) 98.5 F (36.9 C)  TempSrc: Oral Oral Oral Oral  SpO2: 94% 98% 100% 98%  Weight:      Height:       No intake or output data in the 24 hours ending 11/21/20 0928  Last 3 Weights 11/20/2020 11/19/2020 11/19/2020  Weight (lbs) 94 lb 5.7 oz 91 lb 14.9 oz 95 lb 7.4 oz  Weight (kg) 42.8 kg 41.7 kg 43.3 kg      Telemetry    NSR  in 80-90s- Personally Reviewed  ECG    No new EKG - Personally Reviewed  Physical Exam   GEN: In no acute  distress Neck: No JVD Cardiac: Irregular, normal rate, no murmurs, rubs, or gallops.  Respiratory: Clear to auscultation bilaterally. GI: Soft, nontender, non-distended  MS: No edema; No deformity. Neuro: Oriented to person and place Psych: Unable to assess  Labs    High Sensitivity Troponin:   Recent Labs  Lab 11/18/20 1505 11/18/20 1634  TROPONINIHS 44* 44*      Chemistry Recent Labs  Lab 11/14/20 1000 11/18/20 0334 11/19/20 0125  NA 134* 133* 135  K 4.4 3.9 4.2  CL 95* 96* 97*  CO2 29 28 27   GLUCOSE 81 77 77  BUN 24* 21 29*  CREATININE 6.87* 6.18* 8.06*  CALCIUM 9.5 9.7 10.1  ALBUMIN 2.9* 2.5*  --   GFRNONAA 6* 7* 5*  ANIONGAP 10 9 11      Lipids No results for input(s): CHOL, TRIG, HDL, LABVLDL, LDLCALC, CHOLHDL in the last 168 hours.  Hematology Recent Labs  Lab 11/18/20 0334 11/19/20 0125 11/21/20 0453  WBC 3.2* 3.9* 9.4  RBC 2.16* 2.41* 2.96*  HGB 7.0* 7.7* 9.5*  HCT 23.4* 25.4* 31.2*  MCV 108.3* 105.4* 105.4*  MCH 32.4 32.0 32.1  MCHC 29.9* 30.3 30.4  RDW 15.3 15.3 16.2*  PLT 216 243 350    Thyroid No results for input(s): TSH, FREET4 in the last 168 hours.  BNPNo results for input(s): BNP, PROBNP in the last 168 hours.  DDimer No results for input(s): DDIMER in the last 168 hours.   Radiology    EEG adult  Result Date: 11/20/2020 Derek Jack, MD     11/20/2020  7:23 PM Routine EEG Report LEATRICE PARILLA is a 70 y.o. female with a history of altered mental status who is undergoing an EEG to evaluate for seizures. Report: This EEG was acquired with electrodes placed according to the International 10-20 electrode system (including Fp1, Fp2, F3, F4, C3, C4, P3, P4, O1, O2, T3, T4, T5, T6, A1, A2, Fz, Cz, Pz). The following electrodes were missing or displaced: none. The best background was 6 Hz with intermittent diffuse slowing in the delta range. This activity is reactive to stimulation. Drowsiness was manifested by background fragmentation;  deeper stages of sleep were manifested by K complexes and sleep spindles. There was no focal slowing. There were no interictal epileptiform discharges. There were no electrographic seizures identified. Photic stimulation and hyperventilation were not performed. Impression and clinical correlation: This EEG was obtained while awake and asleep and is abnormal due to moderate-to-severe diffuse slowing indicating global cerebral dysfunction. Su Monks, MD [facility] [phone] If 7pm- 7am, please page neurology on call as listed in [place].   ECHOCARDIOGRAM COMPLETE  Result Date: 11/19/2020    ECHOCARDIOGRAM REPORT   Patient Name:   ALBERTINA LEISE Date of Exam: 11/19/2020 Medical Rec #:  [id number]        Height:       65.0 in Accession #:    [id number]       Weight:       91.9 lb Date of Birth:  [birth year]       BSA:          1.422 m Patient Age:    70 years         BP:           148/69 mmHg Patient Gender: F                HR:           91 bpm. Exam Location:  Inpatient Procedure: 2D Echo, 3D Echo, Cardiac Doppler and Color Doppler Indications:    Chest Pain R07.9  History:        Patient has prior history of Echocardiogram examinations, most                 recent 02/21/2015. TIA and Pulmonary HTN; Arrythmias:Atrial                 Flutter and Tachycardia. GERD. End stage renal disease.  Sonographer:    Darlina Sicilian RDCS Referring Phys: [id number] [place]  1. Left ventricular ejection fraction, by estimation, is 60 to 65%. Left ventricular ejection fraction by 3D volume is 64 %. The left ventricle has normal function. The left ventricle has no regional wall motion abnormalities. Left ventricular diastolic  function could not be evaluated.  2. Right ventricular systolic function is normal. The right ventricular size is normal. Tricuspid regurgitation signal is inadequate for assessing PA pressure.  3. Left atrial size was mildly dilated.  4. The mitral valve is normal in  structure. Trivial mitral valve regurgitation. No evidence  of mitral stenosis.  5. The aortic valve is tricuspid. Aortic valve regurgitation is not visualized. Mild to moderate aortic valve sclerosis/calcification is present, without any evidence of aortic stenosis.  6. The inferior vena cava is normal in size with greater than 50% respiratory variability, suggesting right atrial pressure of 3 mmHg. FINDINGS  Left Ventricle: Left ventricular ejection fraction, by estimation, is 60 to 65%. Left ventricular ejection fraction by 3D volume is 64 %. The left ventricle has normal function. The left ventricle has no regional wall motion abnormalities. The left ventricular internal cavity size was normal in size. There is no left ventricular hypertrophy. Left ventricular diastolic function could not be evaluated. Right Ventricle: The right ventricular size is normal. No increase in right ventricular wall thickness. Right ventricular systolic function is normal. Tricuspid regurgitation signal is inadequate for assessing PA pressure. Left Atrium: Left atrial size was mildly dilated. Right Atrium: Right atrial size was normal in size. Pericardium: There is no evidence of pericardial effusion. Mitral Valve: The mitral valve is normal in structure. Trivial mitral valve regurgitation. No evidence of mitral valve stenosis. Tricuspid Valve: The tricuspid valve is normal in structure. Tricuspid valve regurgitation is trivial. No evidence of tricuspid stenosis. Aortic Valve: The aortic valve is tricuspid. Aortic valve regurgitation is not visualized. Mild to moderate aortic valve sclerosis/calcification is present, without any evidence of aortic stenosis. Pulmonic Valve: The pulmonic valve was normal in structure. Pulmonic valve regurgitation is trivial. No evidence of pulmonic stenosis. Aorta: The aortic root is normal in size and structure. Venous: The inferior vena cava is normal in size with greater than 50% respiratory  variability, suggesting right atrial pressure of 3 mmHg. IAS/Shunts: No atrial level shunt detected by color flow Doppler.   3D Volume EF LV 3D EF:    Left ventricular ejection fraction by 3D volume is 64              %.  3D Volume EF: 3D EF:        64 % Fransico Him MD Electronically signed by Fransico Him MD Signature Date/Time: 11/19/2020/3:44:21 PM    Final     Cardiac Studies     Patient Profile     70 y.o. female with a history of isolated episode of atrial flutter in 2016 not on anticoagulation, prior TIA, PAD with prior stenting to right innominate artery, hypertension, ESRD on hemodialysis, thrombocytopenia, GERD, HIV, and chronic pain who is being seen for the evaluation of chest pain   Assessment & Plan     Atrial Flutter - Isolated episode of atrial flutter in 2016 with no recurrence. Therefore, was not on anticoagulation. On 9/21, had episode of atrial flutter - CHADS-VASc score 5, started on heparin gtt.  Would plan to transition to Eliquis 2.5 mg BID once OK from surgical standpoint (low dose Eliquis given weight/renal function, d/w pharmacy: OK for Eliquis, no interactions with HIV meds) -Rates appear controlled on metoprolol 25 mg twice daily -Given rates controlled, will plan outpatient follow-up for atrial flutter.  If remains in aflutter as recovers from her surgery, will plan outpatient cardioversion once completed 3 weeks of anticoagulation  Questionable Chest Pain - Patient had questionable episode of chest pain 9/19. However, she has had mental status changes so difficult to get reliable history.  - EKG showed showed normal sinus rhythm, rate 86 bpm, with T wave inversions/mild ST depression in anterolateral leads. She has had transient ST/T changes in these leads before.. - Troponin 44>44, not consistent  with ACS - Echo 9/20 with normal biventricular function, no regional wall motion abnormalities, mild left atrial dilatation, no significant valvular  disease. -Recommend outpatient follow-up.  Can consider ischemia evaluation as outpatient if having further chest pain once encephalopathy resolves   Hypertension - BP very labile  - Not on any antihypertensives at home. - Consider adding Amlodipine if patient does not having any additional low BP readings.   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 Patient was admitted and returned to the OR on 11/12/2020 for resection of distal fistula and conversion to upper arm fistula on the right. - Management per Vascular Surgery.   ESRD On Hemodialysis - Management per Nephrology.   Mental Status Change - Patient noted to have acute mental status change - staring off to space and having delayed responses. Also had some jerking of her upper body e - Brain MRI showed no acute abnormalities - Neurology consulted, planning EEG   Otherwise, per primary team: - Thrombocytopenia - Anemia of chronic kidney disease - HIV   For questions or updates, please contact [facility] Please consult [email or web address] for contact info under        Signed, Donato Heinz, MD  11/21/2020, 9:28 AM

## 2020-11-21 NOTE — Progress Notes (Signed)
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  Patient receives dialysis on Tuesday Thursday Saturday basis.  Next dialysis treatment will be 11/22/2020.  Usual dialysis centers dialysis [facility]  Blood pressure 149/84 pulse 87 temperature 98.8 O2 sats 90% room air  Sodium 135 potassium 4.2 chloride 97 CO2 27 BUN 29 creatinine 8 glucose 77 calcium 10.1 hemoglobin 7.7  Underwent dialysis 11/19/2020 with 1.1 L removed and next dialysis due 11/21/2020      Objective:  Vital signs in last 24 hours:  Temp:  [98 F (36.7 C)-98.7 F (37.1 C)] 98.5 F (36.9 C) (09/22 0822) Pulse Rate:  [76-118] 91 (09/22 0822) Resp:  [15-20] 20 (09/22 0822) BP: (113-142)/(70-86) 113/80 (09/22 0822) SpO2:  [94 %-100 %] 98 % (09/22 0822)  Weight change:  Filed Weights   11/19/20 0906 11/19/20 1217 11/20/20 0644  Weight: 43.3 kg 41.7 kg 42.8 kg    Intake/Output: No intake/output data recorded.   Intake/Output this shift:  Total I/O In: 480 [P.O.:480] Out: -   Gen:nad CVS:rrr Resp:normal wob TIW:PYKD, nt Ext: RUE swelling Neuro: awake, alert HD access: RUE AVF, woundvac+dressing in place   Basic Metabolic Panel: Recent Labs  Lab 11/14/20 1000 11/18/20 0334 11/19/20 0125  NA 134* 133* 135  K 4.4 3.9 4.2  CL 95* 96* 97*  CO2 29 28 27   GLUCOSE 81 77 77  BUN 24* 21 29*  CREATININE 6.87* 6.18* 8.06*  CALCIUM 9.5 9.7 10.1  PHOS 3.7 2.9  --      Liver Function Tests: Recent Labs  Lab 11/14/20 1000 11/18/20 0334  ALBUMIN 2.9* 2.5*    No results for input(s): LIPASE, AMYLASE in the last 168 hours. No results for input(s): AMMONIA in the last 168 hours.  CBC: Recent Labs  Lab 11/18/20 0334 11/19/20 0125 11/21/20 0453  WBC 3.2* 3.9* 9.4  HGB 7.0* 7.7* 9.5*  HCT 23.4* 25.4* 31.2*  MCV 108.3* 105.4*  105.4*  PLT 216 243 350     Cardiac Enzymes: No results for input(s): CKTOTAL, CKMB, CKMBINDEX, TROPONINI in the last 168 hours.  BNP: Invalid input(s): POCBNP  CBG: Recent Labs  Lab 11/16/20 0611 11/16/20 1448 11/17/20 0628 11/18/20 0850 11/20/20 1414  GLUCAP 84 82 74 94 95     Microbiology: Results for orders placed or performed during the hospital encounter of 11/11/20  SARS CORONAVIRUS 2 (TAT 6-24 HRS) Nasopharyngeal Nasopharyngeal Swab     Status: None   Collection Time: 11/12/20  3:06 AM   Specimen: Nasopharyngeal Swab  Result Value Ref Range Status   SARS Coronavirus 2 NEGATIVE NEGATIVE Final    Comment: (NOTE) SARS-CoV-2 target nucleic acids are NOT DETECTED.  The SARS-CoV-2 RNA is generally detectable in upper and lower respiratory specimens during the acute phase of infection. Negative results do not preclude SARS-CoV-2 infection, do not rule out co-infections with other pathogens, and should not be used as the sole basis for treatment or other patient management decisions. Negative results must be combined with clinical observations, patient history, and epidemiological information. The expected result is Negative.  Fact Sheet for Patients: SugarRoll.be  Fact Sheet for Healthcare Providers: https://www.woods-mathews.com/  This test is not yet approved or cleared by the [place] FDA and  has been authorized for detection and/or diagnosis of SARS-CoV-2 by FDA under an Emergency Use Authorization (EUA). This  EUA will remain  in effect (meaning this test can be used) for the duration of the COVID-19 declaration under Se ction 564(b)(1) of the Act, 21 U.S.C. section 360bbb-3(b)(1), unless the authorization is terminated or revoked sooner.  Performed at [facility], [place] [address] [place], [place] [address]   Surgical pcr screen     Status: Abnormal   Collection Time: 11/12/20  4:52 AM   Specimen:  Nasal Mucosa; Nasal Swab  Result Value Ref Range Status   MRSA, PCR POSITIVE (A) NEGATIVE Final    Comment: RESULT CALLED TO, READ BACK BY AND VERIFIED WITH: RN KERI DAVIDSON 11/12/20@6 :32 BY TW    Staphylococcus aureus POSITIVE (A) NEGATIVE Final    Comment: RESULT CALLED TO, READ BACK BY AND VERIFIED WITH: RN KERI DAVIDSON 11/12/20@6 :32 BY TW (NOTE) The Xpert SA Assay (FDA approved for NASAL specimens in patients 78 years of age and older), is one component of a comprehensive surveillance program. It is not intended to diagnose infection nor to guide or monitor treatment. Performed at [facility], [place] [address] [place], [place] [address]   SARS CORONAVIRUS 2 (TAT 6-24 HRS) Nasopharyngeal Nasopharyngeal Swab     Status: None   Collection Time: 11/15/20  5:20 PM   Specimen: Nasopharyngeal Swab  Result Value Ref Range Status   SARS Coronavirus 2 NEGATIVE NEGATIVE Final    Comment: (NOTE) SARS-CoV-2 target nucleic acids are NOT DETECTED.  The SARS-CoV-2 RNA is generally detectable in upper and lower respiratory specimens during the acute phase of infection. Negative results do not preclude SARS-CoV-2 infection, do not rule out co-infections with other pathogens, and should not be used as the sole basis for treatment or other patient management decisions. Negative results must be combined with clinical observations, patient history, and epidemiological information. The expected result is Negative.  Fact Sheet for Patients: SugarRoll.be  Fact Sheet for Healthcare Providers: https://www.woods-mathews.com/  This test is not yet approved or cleared by the [place] FDA and  has been authorized for detection and/or diagnosis of SARS-CoV-2 by FDA under an Emergency Use Authorization (EUA). This EUA will remain  in effect (meaning this test can be used) for the duration of the COVID-19 declaration under Se ction 564(b)(1) of the  Act, 21 U.S.C. section 360bbb-3(b)(1), unless the authorization is terminated or revoked sooner.  Performed at [facility], [place] [address], [place],  [address]     Coagulation Studies: No results for input(s): LABPROT, INR in the last 72 hours.  Urinalysis: No results for input(s): COLORURINE, LABSPEC, PHURINE, GLUCOSEU, HGBUR, BILIRUBINUR, KETONESUR, PROTEINUR, UROBILINOGEN, NITRITE, LEUKOCYTESUR in the last 72 hours.  Invalid input(s): APPERANCEUR    Imaging: EEG adult  Result Date: 11/20/2020 Derek Jack, MD     11/20/2020  7:23 PM Routine EEG Report [name] is a 70 y.o. female with a history of altered mental status who is undergoing an EEG to evaluate for seizures. Report: This EEG was acquired with electrodes placed according to the International 10-20 electrode system (including Fp1, Fp2, F3, F4, C3, C4, P3, P4, O1, O2, T3, T4, T5, T6, A1, A2, Fz, Cz, Pz). The following electrodes were missing or displaced: none. The best background was 6 Hz with intermittent diffuse slowing in the delta range. This activity is reactive to stimulation. Drowsiness was manifested by background fragmentation; deeper stages of sleep were manifested by K complexes and sleep spindles. There was no focal slowing. There were no interictal epileptiform discharges.  There were no electrographic seizures identified. Photic stimulation and hyperventilation were not performed. Impression and clinical correlation: This EEG was obtained while awake and asleep and is abnormal due to moderate-to-severe diffuse slowing indicating global cerebral dysfunction. Su Monks, MD [facility] [phone] If 7pm- 7am, please page neurology on call as listed in [place].   ECHOCARDIOGRAM COMPLETE  Result Date: 11/19/2020    ECHOCARDIOGRAM REPORT   Patient Name:   CHARNISE LOVAN Date of Exam: 11/19/2020 Medical Rec #:  [id number]        Height:       65.0 in Accession #:    [id number]        Weight:       91.9 lb Date of Birth:  08-05-1950       BSA:          1.422 m Patient Age:    18 years         BP:           148/69 mmHg Patient Gender: F                HR:           91 bpm. Exam Location:  Inpatient Procedure: 2D Echo, 3D Echo, Cardiac Doppler and Color Doppler Indications:    Chest Pain R07.9  History:        Patient has prior history of Echocardiogram examinations, most                 recent 02/21/2015. TIA and Pulmonary HTN; Arrythmias:Atrial                 Flutter and Tachycardia. GERD. End stage renal disease.  Sonographer:    Darlina Sicilian RDCS Referring Phys: [id number] [place]  1. Left ventricular ejection fraction, by estimation, is 60 to 65%. Left ventricular ejection fraction by 3D volume is 64 %. The left ventricle has normal function. The left ventricle has no regional wall motion abnormalities. Left ventricular diastolic  function could not be evaluated.  2. Right ventricular systolic function is normal. The right ventricular size is normal. Tricuspid regurgitation signal is inadequate for assessing PA pressure.  3. Left atrial size was mildly dilated.  4. The mitral valve is normal in structure. Trivial mitral valve regurgitation. No evidence of mitral stenosis.  5. The aortic valve is tricuspid. Aortic valve regurgitation is not visualized. Mild to moderate aortic valve sclerosis/calcification is present, without any evidence of aortic stenosis.  6. The inferior vena cava is normal in size with greater than 50% respiratory variability, suggesting right atrial pressure of 3 mmHg. FINDINGS  Left Ventricle: Left ventricular ejection fraction, by estimation, is 60 to 65%. Left ventricular ejection fraction by 3D volume is 64 %. The left ventricle has normal function. The left ventricle has no regional wall motion abnormalities. The left ventricular internal cavity size was normal in size. There is no left ventricular hypertrophy. Left ventricular diastolic  function could not be evaluated. Right Ventricle: The right ventricular size is normal. No increase in right ventricular wall thickness. Right ventricular systolic function is normal. Tricuspid regurgitation signal is inadequate for assessing PA pressure. Left Atrium: Left atrial size was mildly dilated. Right Atrium: Right atrial size was normal in size. Pericardium: There is no evidence of pericardial effusion. Mitral Valve: The mitral valve is normal in structure. Trivial mitral valve regurgitation. No evidence of mitral valve stenosis. Tricuspid Valve: The tricuspid valve is normal in structure. Tricuspid valve  regurgitation is trivial. No evidence of tricuspid stenosis. Aortic Valve: The aortic valve is tricuspid. Aortic valve regurgitation is not visualized. Mild to moderate aortic valve sclerosis/calcification is present, without any evidence of aortic stenosis. Pulmonic Valve: The pulmonic valve was normal in structure. Pulmonic valve regurgitation is trivial. No evidence of pulmonic stenosis. Aorta: The aortic root is normal in size and structure. Venous: The inferior vena cava is normal in size with greater than 50% respiratory variability, suggesting right atrial pressure of 3 mmHg. IAS/Shunts: No atrial level shunt detected by color flow Doppler.   3D Volume EF LV 3D EF:    Left ventricular ejection fraction by 3D volume is 64              %.  3D Volume EF: 3D EF:        64 % Fransico Him MD Electronically signed by Fransico Him MD Signature Date/Time: 11/19/2020/3:44:21 PM    Final      Medications:    sodium chloride     heparin 700 Units/hr (11/20/20 2058)    vitamin C  500 mg Oral Q12H   calcium acetate  1,334 mg Oral TID WC   Chlorhexidine Gluconate Cloth  6 each Topical Q0600   cinacalcet  60 mg Oral Q T,Th,Sa-HD   clopidogrel  75 mg Oral Q breakfast   darbepoetin (ARANESP) injection - DIALYSIS  100 mcg Intravenous Q Thu-HD   dolutegravir  50 mg Oral q1600   feeding supplement  237 mL  Oral BID BM   lamiVUDine  50 mg Oral QAC breakfast   melatonin  3 mg Oral Q24H   metoprolol tartrate  25 mg Oral BID   mirtazapine  7.5 mg Oral QHS   multivitamin  1 tablet Oral QHS   pantoprazole  40 mg Oral BID AC   senna  2 tablet Oral Q24H   sodium chloride flush  3 mL Intravenous Q12H   venlafaxine XR  150 mg Oral Q breakfast   zidovudine  100 mg Oral 3 times per day   sodium chloride, acetaminophen, calcitRIOL, calcium acetate, hydrALAZINE, metoprolol tartrate, naLOXone (NARCAN)  injection, nitroGLYCERIN, ondansetron (ZOFRAN) IV, ondansetron, sodium chloride flush  Assessment/ Plan:   ESRD:  next dialysis to be 11/21/2020 Volume/ hypertension: . UF as tolerated Anemia of Chronic Kidney Disease: Continue to follow Aranesp initiated in hospital  Secondary Hyperparathyroidism/Hyperphosphatemia: Continue with binders; receives cinacalcet 60mg  and calcitriol 1.36mcg TIW  Vascular access: Right upper extremity fistula with good bruit, slightly pulsatile s/p rt cimino resection + ligation prox/distal + collaterals.. Have been able to cannulate Dispo: pending d/c back to SNF, PT/OT  LOS: [place] @TODAY @9 :35 AM

## 2020-11-21 NOTE — Progress Notes (Signed)
ANTICOAGULATION CONSULT NOTE - Initial Consult  Pharmacy Consult for Heparin Indication: atrial fibrillation  Allergies  Allergen Reactions   Lactose Intolerance (Gi) Other (See Comments)    On MAR   Latex Rash and Itching   Penicillins Rash and Swelling    Has patient had a PCN reaction causing immediate rash, facial/tongue/throat swelling, SOB or lightheadedness with hypotension: No Has patient had a PCN reaction causing severe rash involving mucus membranes or skin necrosis: No Has patient had a PCN reaction that required hospitalization No Has patient had a PCN reaction occurring within the last 10 years: No If all of the above answers are "NO", then may proceed with Cephalosporin use.      Patient Measurements: Height: 5\' 5"  (165.1 cm) Weight: 42.8 kg (94 lb 5.7 oz) IBW/kg (Calculated) : 57  Heparin Dosing Weight: 47.7 kg  Vital Signs: Temp: 98.2 F (36.8 C) (09/22 0455) Temp Source: Oral (09/22 0455) BP: 142/70 (09/22 0455) Pulse Rate: 92 (09/22 0455)  Labs: Recent Labs    11/18/20 1505 11/18/20 1634 11/19/20 0125 11/21/20 0453  HGB  --   --  7.7* 9.5*  HCT  --   --  25.4* 31.2*  PLT  --   --  243 350  HEPARINUNFRC  --   --   --  0.65  CREATININE  --   --  8.06*  --   TROPONINIHS 44* 44*  --   --      Estimated Creatinine Clearance: 4.6 mL/min (A) (by C-G formula based on SCr of 8.06 mg/dL (H)).   Medical History: Past Medical History:  Diagnosis Date   Anemia    Anxiety    Atrial flutter ([place]) 02/22/2015   C. 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[facility])    Muscle weakness (generalized)    Pulmonary HTN ([facility])    Tachycardia    TIA (transient ischemic attack)    [place] do not have  this dx   Traumatic hematoma of right upper arm 02/22/2015    Medications:  Scheduled:   vitamin C  500 mg Oral Q12H   calcium acetate  1,334 mg Oral TID WC   Chlorhexidine Gluconate Cloth  6 each Topical Q0600   cinacalcet  60 mg Oral Q T,Th,Sa-HD   clopidogrel  75 mg Oral Q breakfast   darbepoetin (ARANESP) injection - DIALYSIS  100 mcg Intravenous Q Thu-HD   dolutegravir  50 mg Oral q1600   feeding supplement  237 mL Oral BID BM   lamiVUDine  50 mg Oral QAC breakfast   melatonin  3 mg Oral Q24H   metoprolol tartrate  25 mg Oral BID   mirtazapine  7.5 mg Oral QHS   multivitamin  1 tablet Oral QHS   pantoprazole  40 mg Oral BID AC   senna  2 tablet Oral Q24H   sodium chloride flush  3 mL Intravenous Q12H   venlafaxine XR  150 mg Oral Q breakfast   zidovudine  100 mg Oral 3 times per day   Infusions:   sodium chloride     heparin 700 Units/hr (11/20/20 2058)    Assessment: 70 y.o. female with a history  of isolated episode of atrial flutter in 2016 not on anticoagulation, prior TIA, PAD with prior stenting to right innominate artery, hypertension, ESRD on hemodialysis, thrombocytopenia, GERD, HIV, and chronic pain who is being seen for the evaluation of chest pain now found to be in Aflutter w/ RVR. Pharmacy is consulted to initiate IV heparin for anticoagulation.   No anticoagulation prior to admission. Patient has anemia at baseline. Hgb remains low at 7.7 9/20. Platelets within normal limits with 9/20 @ 243. Will conservatively dose heparin with lower bolus and watch CBC closely. No s/sx bleeding currently per chart review.   HL came back therapeutic at 0.65 this AM. We will continue with the same rate and check confirm level.   Goal of Therapy:  Heparin level 0.3-0.7 units/ml Monitor platelets by anticoagulation protocol: Yes   Plan:   Continue heparin at 700 units/hr [place] 6 hr confirm level  Onnie Boer, PharmD, BCIDP, AAHIVP, CPP Infectious Disease  Pharmacist 11/21/2020 5:49 AM

## 2020-11-22 DIAGNOSIS — N186 End stage renal disease: Secondary | ICD-10-CM | POA: Diagnosis not present

## 2020-11-22 DIAGNOSIS — I4892 Unspecified atrial flutter: Secondary | ICD-10-CM | POA: Diagnosis not present

## 2020-11-22 DIAGNOSIS — R079 Chest pain, unspecified: Secondary | ICD-10-CM | POA: Diagnosis not present

## 2020-11-22 LAB — CBC
HCT: 41.5 % (ref 36.0–46.0)
Hemoglobin: 12.4 g/dL (ref 12.0–15.0)
MCH: 31.9 pg (ref 26.0–34.0)
MCHC: 29.9 g/dL — ABNORMAL LOW (ref 30.0–36.0)
MCV: 106.7 fL — ABNORMAL HIGH (ref 80.0–100.0)
Platelets: 263 10*3/uL (ref 150–400)
RBC: 3.89 MIL/uL (ref 3.87–5.11)
RDW: 16.7 % — ABNORMAL HIGH (ref 11.5–15.5)
WBC: 9.3 10*3/uL (ref 4.0–10.5)
nRBC: 0.2 % (ref 0.0–0.2)

## 2020-11-22 LAB — HEPARIN LEVEL (UNFRACTIONATED): Heparin Unfractionated: 0.42 IU/mL (ref 0.30–0.70)

## 2020-11-22 MED ORDER — CHLORHEXIDINE GLUCONATE CLOTH 2 % EX PADS
6.0000 | MEDICATED_PAD | Freq: Every day | CUTANEOUS | Status: DC
  Administered 2020-11-22 – 2020-11-27 (×6): 6 via TOPICAL

## 2020-11-22 MED ORDER — APIXABAN 2.5 MG PO TABS
2.5000 mg | ORAL_TABLET | Freq: Two times a day (BID) | ORAL | Status: DC
  Administered 2020-11-22 – 2020-11-27 (×10): 2.5 mg via ORAL
  Filled 2020-11-22 (×10): qty 1

## 2020-11-22 MED ORDER — LAMIVUDINE 10 MG/ML PO SOLN
50.0000 mg | Freq: Every day | ORAL | Status: DC
  Administered 2020-11-23 – 2020-11-27 (×5): 50 mg via ORAL
  Filled 2020-11-22 (×5): qty 5

## 2020-11-22 MED ORDER — APIXABAN 5 MG PO TABS: 5.0000 mg | ORAL_TABLET | Freq: Two times a day (BID) | ORAL | Status: DC

## 2020-11-22 NOTE — Progress Notes (Addendum)
  Progress Note    11/22/2020 8:05 AM 10 Days Post-Op  Subjective:  no complaints. Asking when she can go home   Vitals:   11/21/20 2308 11/22/20 0317  BP: 140/63 (!) 154/80  Pulse: 80 77  Resp: 20 20  Temp: 98.2 F (36.8 C) 98.5 F (36.9 C)  SpO2: 97% 97%   Physical Exam: Cardiac:  regular Lungs:  non labored Incisions:  right upper arm incisions intact, wound VAC in place to forearm. Right hand cold. 2+ radial pulse Extremities:  well perfused and warm Neurologic: alert, confused  CBC    Component Value Date/Time   WBC 9.3 11/22/2020 0216   RBC 3.89 11/22/2020 0216   HGB 12.4 11/22/2020 0216   HCT 41.5 11/22/2020 0216   PLT 263 11/22/2020 0216   MCV 106.7 (H) 11/22/2020 0216   MCH 31.9 11/22/2020 0216   MCHC 29.9 (L) 11/22/2020 0216   RDW 16.7 (H) 11/22/2020 0216   LYMPHSABS 0.6 (L) 10/18/2019 0941   MONOABS 0.3 10/18/2019 0941   EOSABS 0.1 10/18/2019 0941   BASOSABS 0.0 10/18/2019 0941    BMET    Component Value Date/Time   NA 138 11/21/2020 1330   K 3.9 11/21/2020 1330   CL 97 (L) 11/21/2020 1330   CO2 27 11/21/2020 1330   GLUCOSE 122 (H) 11/21/2020 1330   BUN 39 (H) 11/21/2020 1330   CREATININE 7.92 (H) 11/21/2020 1330   CREATININE 5.66 (H) 12/22/2019 1117   CALCIUM 10.2 11/21/2020 1330   CALCIUM 9.0 03/12/2011 0527   GFRNONAA 5 (L) 11/21/2020 1330   GFRNONAA 5 (L) 02/12/2015 1000   GFRAA 9 (L) 10/22/2019 0105   GFRAA 5 (L) 02/12/2015 1000    INR    Component Value Date/Time   INR 1.14 12/28/2011 1403     Intake/Output Summary (Last 24 hours) at 11/22/2020 0805 Last data filed at 11/22/2020 0048 Gross per 24 hour  Intake 630.48 ml  Output 104 ml  Net 526.48 ml     Assessment/Plan:  70 y.o. 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 Next change Monday RUE well perfused with palpable radial pulse Good  thrill in fistula Next HD tomorrow Appreciate cardiology assistance Atrial flutter rate controlled on metoprolol Recommend Eliquis 2.5 mg BID. Will transition from Heparin IV Encephalopathy. Appreciate Neurology assistance. Recommending outpatient follow up PT/OT recommend SNF   DVT prophylaxis:  heparin IV   Karoline Caldwell, PA-C [facility] [phone] 11/22/2020 8:05 AM  I have independently interviewed and examined patient and agree with PA assessment and plan above.  Appreciate assistance of consulting services.  Plan will be for SNF on discharge.  Krimson Massmann C. Donzetta Matters, MD [facility] [place] Office: [phone] Pager: [phone]

## 2020-11-22 NOTE — Plan of Care (Signed)
  Problem: Education: Goal: Knowledge of General Education information will improve Description: Including pain rating scale, medication(s)/side effects and non-pharmacologic comfort measures 11/22/2020 1651 by Lasandra Beech, RN Outcome: Progressing 11/22/2020 1651 by Lasandra Beech, RN Outcome: Progressing

## 2020-11-22 NOTE — Plan of Care (Signed)
  Problem: Education: Goal: Knowledge of General Education information will improve Description Including pain rating scale, medication(s)/side effects and non-pharmacologic comfort measures Outcome: Progressing   

## 2020-11-22 NOTE — Progress Notes (Signed)
Physical Therapy Treatment Patient Details Name: [name] MRN: [id number] DOB: 12-Feb-1951 Today's Date: 11/22/2020   History of Present Illness Pt is a 70 y.o. female who presented with end-stage renal disease 11/11/20 for right A/V fistulagram. 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 Pt with decline during admission, MRI negative for acute intracranial process, awaiting EEG results. PMH: anemia, anxiety, atrial flutter, C diff, depression, ESRD on HD, HIV, HTN, intracranial hemorrhage, pulmonary HTN, tachycardia, TIA, traumatic hematoma of R UE    PT Comments    Pt was seen for mobility on RW to sidestep.  Pt is weak but able to assist with RUE with extra time.  Pt is requiring hand over hand cues for placement of arms to initiate a stand, and to maintain her balance with postural correction.  Follow along with her to work on increasing standing endurance, balance and tolerance for time OOB.  Reposition RUE to avoid ROM loss and to increase comfort for mobility work.   Recommendations for follow up therapy are one component of a multi-disciplinary discharge planning process, led by the attending physician.  Recommendations may be updated based on patient status, additional functional criteria and insurance authorization.  Follow Up Recommendations  SNF     Equipment Recommendations  None recommended by PT    Recommendations for Other Services       Precautions / Restrictions Precautions Precautions: Fall Precaution Comments: R UE wound vac Restrictions Weight Bearing Restrictions: No     Mobility  Bed Mobility Overal bed mobility: Needs Assistance Bed Mobility: Sit to Supine;Supine to Sit     Supine to sit: Mod assist Sit to supine: Mod assist   General bed mobility comments: pt does recall some steps to start moving but cannot sequence    Transfers Overall transfer level: Needs assistance Equipment used: Rolling walker (2  wheeled) Transfers: Sit to/from Stand Sit to Stand: Mod assist Stand pivot transfers: Mod assist       General transfer comment: mult stands with help to control static balance  Ambulation/Gait Ambulation/Gait assistance: Mod assist Gait Distance (Feet): 20 Feet (4x5) Assistive device: Rolling walker (2 wheeled)   Gait velocity: reduced   General Gait Details: pt is up to sidestep with tendency to list to the left, struggling to open stance and at one point crossed L foot over R in sidestepping   Stairs             Wheelchair Mobility    Modified Rankin (Stroke Patients Only)       Balance Overall balance assessment: History of Falls;Needs assistance Sitting-balance support: Feet supported Sitting balance-Leahy Scale: Poor     Standing balance support: Bilateral upper extremity supported;During functional activity Standing balance-Leahy Scale: Poor                              Cognition Arousal/Alertness: Awake/alert Behavior During Therapy: Flat affect Overall Cognitive Status: Impaired/Different from baseline Area of Impairment: Orientation;Attention;Memory;Following commands;Safety/judgement;Awareness;Problem solving                 Orientation Level: Situation;Time Current Attention Level: Selective Memory: Decreased short-term memory Following Commands: Follows one step commands inconsistently;Follows one step commands with increased time Safety/Judgement: Decreased awareness of safety;Decreased awareness of deficits Awareness: Intellectual Problem Solving: Slow processing;Decreased initiation;Difficulty sequencing;Requires verbal cues;Requires tactile cues General Comments: pt is up to stand wtih help at [facility] but tends to sit quickly unless supported  Exercises      General Comments General comments (skin integrity, edema, etc.): Pt was assisted to get up to side of bed and then control sit to stand to initiate gait.  Her  efforts are a struggle due to general weakness with her HD and pain distracting on RUE      Pertinent Vitals/Pain Pain Assessment: Faces Faces Pain Scale: Hurts little more Pain Location: R arm Pain Descriptors / Indicators: Burning;Grimacing Pain Intervention(s): Limited activity within patient's tolerance;Monitored during session;Premedicated before session;Repositioned    Home Living                      Prior Function            PT Goals (current goals can now be found in the care plan section) Acute Rehab PT Goals Patient Stated Goal: agrees to walk and wants to get stronger Progress towards PT goals: Progressing toward goals    Frequency    Min 2X/week      PT Plan Current plan remains appropriate    Co-evaluation              AM-PAC PT "6 Clicks" Mobility   Outcome Measure  Help needed turning from your back to your side while in a flat bed without using bedrails?: A Lot Help needed moving from lying on your back to sitting on the side of a flat bed without using bedrails?: A Lot Help needed moving to and from a bed to a chair (including a wheelchair)?: A Lot Help needed standing up from a chair using your arms (e.g., wheelchair or bedside chair)?: A Lot Help needed to walk in hospital room?: A Lot Help needed climbing 3-5 steps with a railing? : Total 6 Click Score: 11    End of Session Equipment Utilized During Treatment: Gait belt Activity Tolerance: Patient limited by fatigue;Patient limited by pain Patient left: in bed;with call bell/phone within reach;with nursing/sitter in room;Other (comment) Nurse Communication: Mobility status;Other (comment) PT Visit Diagnosis: Unsteadiness on feet (R26.81);Other abnormalities of gait and mobility (R26.89);Muscle weakness (generalized) (M62.81);History of falling (Z91.81);Difficulty in walking, not elsewhere classified (R26.2);Pain Pain - Right/Left: Right Pain - part of body: Arm     Time:  6503-5465 PT Time Calculation (min) (ACUTE ONLY): 28 min  Charges:  $Therapeutic Activity: 23-37 mins      Ramond Dial 11/22/2020, 8:25 PM  Mee Hives, PT MS Acute Rehab Dept. Number: [place]

## 2020-11-22 NOTE — Progress Notes (Signed)
Speech Language Pathology Treatment: Dysphagia  Patient Details Name: [name] MRN: [id number] DOB: May 15, 1950 Today's Date: 11/22/2020 Time: 1205-1220 SLP Time Calculation (min) (ACUTE ONLY): 15 min  Assessment / Plan / Recommendation Clinical Impression  Pt was seen for dysphagia treatment. She was alert and cooperative during the session. Pt's mentation was notably improved compared to that on 9/21. She was able to participate in conversation, provide historical information, and demonstrated improved orientation. She reported that her dentures are not at the hospital, but that she does not typically use them during meals. Pt expressed that she has enjoyed the current diet and likes her food as it is. Pt demonstrated prolonged mastication with meats and regular texture solids. Oral clearance was functional with intermittent use of a liquid wash. No s/sx of aspiration were noted with any solids or liquids. Pt's NT reported that the pt was able to eat [employer] chicken sandwiches last week. Pt has requested that her diet not be advanced as yet so her current diet of dysphagia 2 and thin liquids will be continued. However, her prognosis for advancement at least to dysphagia 3 is judged to be good. SLP will continue to follow pt.    HPI HPI: Pt is a 70 y.o. female who presented with end-stage renal disease 11/11/20 for right A/V fistulagram. 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 On 9/19 patient was noted to have altered mental status with staring off, delayed responses, and concern for seizure activity with bilateral upper arm jerking with midline gaze and staring off. MRI 9/91 negative for acute changes. Pt given Narcan with initial improvement in mental status, but continued "staring off and delayed responses". 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 PMH: anemia, anxiety, atrial flutter, C diff, depression, ESRD on HD, HIV, HTN, intracranial  hemorrhage, pulmonary HTN, tachycardia, TIA, traumatic hematoma of R UE      SLP Plan  Continue with current plan of care      Recommendations for follow up therapy are one component of a multi-disciplinary discharge planning process, led by the attending physician.  Recommendations may be updated based on patient status, additional functional criteria and insurance authorization.    Recommendations  Diet recommendations: Dysphagia 2 (fine chop);Thin liquid Liquids provided via: Cup;Straw Medication Administration: Crushed with puree (or whole with puree) Supervision: Patient able to self feed;Full supervision/cueing for compensatory strategies Compensations: Follow solids with liquid Postural Changes and/or Swallow Maneuvers: Seated upright 90 degrees                Oral Care Recommendations: Oral care BID Follow up Recommendations:  (TBD) SLP Visit Diagnosis: Dysphagia, unspecified (R13.10) Plan: Continue with current plan of care       Josejulian Tarango I. Hardin Negus, [place], [place] Office number [phone] Pager [place]  11/22/2020, 1:07 PM

## 2020-11-22 NOTE — Progress Notes (Signed)
Progress Note  Patient Name: [name] Date of Encounter: 11/22/2020  [facility] Cardiologist: Dorris Carnes, MD   Subjective   Remains confused, denies any chest pain  Inpatient Medications    Scheduled Meds:  apixaban  2.5 mg Oral BID   vitamin C  500 mg Oral Q12H   calcium acetate  1,334 mg Oral TID WC   Chlorhexidine Gluconate Cloth  6 each Topical Q0600   cinacalcet  60 mg Oral Q T,Th,Sa-HD   clopidogrel  75 mg Oral Q breakfast   darbepoetin (ARANESP) injection - DIALYSIS  100 mcg Intravenous Q Thu-HD   dolutegravir  50 mg Oral q1600   feeding supplement  237 mL Oral BID BM   lamiVUDine  50 mg Oral QAC breakfast   melatonin  3 mg Oral Q24H   metoprolol tartrate  25 mg Oral BID   mirtazapine  7.5 mg Oral QHS   multivitamin  1 tablet Oral QHS   pantoprazole  40 mg Oral BID AC   senna  2 tablet Oral Q24H   sodium chloride flush  3 mL Intravenous Q12H   venlafaxine XR  150 mg Oral Q breakfast   zidovudine  100 mg Oral 3 times per day   Continuous Infusions:  sodium chloride     PRN Meds: sodium chloride, acetaminophen, calcitRIOL, calcium acetate, hydrALAZINE, metoprolol tartrate, naLOXone (NARCAN)  injection, nitroGLYCERIN, ondansetron (ZOFRAN) IV, ondansetron, sodium chloride flush   Vital Signs    Vitals:   11/21/20 1939 11/21/20 2308 11/22/20 0317 11/22/20 0908  BP:  140/63 (!) 154/80 114/61  Pulse:  80 77 77  Resp: 20 20 20 18   Temp:  98.2 F (36.8 C) 98.5 F (36.9 C) 98.5 F (36.9 C)  TempSrc:  Oral Oral Oral  SpO2:  97% 97% 100%  Weight:      Height:        Intake/Output Summary (Last 24 hours) at 11/22/2020 1026 Last data filed at 11/22/2020 0048 Gross per 24 hour  Intake 150.48 ml  Output 104 ml  Net 46.48 ml    Last 3 Weights 11/21/2020 11/21/2020 11/20/2020  Weight (lbs) (No Data) 94 lb 12.8 oz 94 lb 5.7 oz  Weight (kg) (No Data) 43 kg 42.8 kg      Telemetry    NSR in 80s- Personally Reviewed  ECG    No new EKG - Personally  Reviewed  Physical Exam   GEN: In no acute distress Neck: No JVD Cardiac: RRR,no murmurs, rubs, or gallops.  Respiratory: Clear to auscultation bilaterally. GI: Soft, nontender, non-distended  MS: No edema; No deformity. Neuro: Oriented to person and place Psych: Unable to assess  Labs    High Sensitivity Troponin:   Recent Labs  Lab 11/18/20 1505 11/18/20 1634  TROPONINIHS 44* 44*      Chemistry Recent Labs  Lab 11/18/20 0334 11/19/20 0125 11/21/20 0935 11/21/20 1330  NA 133* 135 137 138  K 3.9 4.2 3.6 3.9  CL 96* 97* 95* 97*  CO2 28 27 27 27   GLUCOSE 77 77 137* 122*  BUN 21 29* 34* 39*  CREATININE 6.18* 8.06* 7.69* 7.92*  CALCIUM 9.7 10.1 10.6* 10.2  PROT  --   --  8.5*  --   ALBUMIN 2.5*  --  3.2* 3.0*  AST  --   --  20  --   ALT  --   --  11  --   ALKPHOS  --   --  83  --  BILITOT  --   --  0.6  --   GFRNONAA 7* 5* 5* 5*  ANIONGAP 9 11 15 14      Lipids No results for input(s): CHOL, TRIG, HDL, LABVLDL, LDLCALC, CHOLHDL in the last 168 hours.  Hematology Recent Labs  Lab 11/21/20 0453 11/21/20 1330 11/22/20 0216  WBC 9.4 10.9* 9.3  RBC 2.96* 2.59* 3.89  HGB 9.5* 8.2* 12.4  HCT 31.2* 27.8* 41.5  MCV 105.4* 107.3* 106.7*  MCH 32.1 31.7 31.9  MCHC 30.4 29.5* 29.9*  RDW 16.2* 16.5* 16.7*  PLT 350 328 263    Thyroid No results for input(s): TSH, FREET4 in the last 168 hours.  BNPNo results for input(s): BNP, PROBNP in the last 168 hours.  DDimer No results for input(s): DDIMER in the last 168 hours.   Radiology    EEG adult  Result Date: 11/20/2020 Derek Jack, MD     11/20/2020  7:23 PM Routine EEG Report LUDA CHARBONNEAU is a 70 y.o. female with a history of altered mental status who is undergoing an EEG to evaluate for seizures. Report: This EEG was acquired with electrodes placed according to the International 10-20 electrode system (including Fp1, Fp2, F3, F4, C3, C4, P3, P4, O1, O2, T3, T4, T5, T6, A1, A2, Fz, Cz, Pz). The following  electrodes were missing or displaced: none. The best background was 6 Hz with intermittent diffuse slowing in the delta range. This activity is reactive to stimulation. Drowsiness was manifested by background fragmentation; deeper stages of sleep were manifested by K complexes and sleep spindles. There was no focal slowing. There were no interictal epileptiform discharges. There were no electrographic seizures identified. Photic stimulation and hyperventilation were not performed. Impression and clinical correlation: This EEG was obtained while awake and asleep and is abnormal due to moderate-to-severe diffuse slowing indicating global cerebral dysfunction. Su Monks, MD [facility] [phone] If 7pm- 7am, please page neurology on call as listed in [place].    Cardiac Studies     Patient Profile     70 y.o. female with a history of isolated episode of atrial flutter in 2016 not on anticoagulation, prior TIA, PAD with prior stenting to right innominate artery, hypertension, ESRD on hemodialysis, thrombocytopenia, GERD, HIV, and chronic pain who is being seen for the evaluation of chest pain   Assessment & Plan    Atrial Flutter - Isolated episode of atrial flutter in 2016 with no recurrence. Therefore, was not on anticoagulation. On 9/21, had episode of atrial flutter.  Converted back to NSR on 9/22 - CHADS-VASc score 5, started on heparin gtt.  Would plan to transition to Eliquis 2.5 mg BID once OK from surgical standpoint (low dose Eliquis given weight/renal function, d/w pharmacy: OK for Eliquis, no interactions with HIV meds) -Continue metoprolol 25 mg twice daily   Questionable Chest Pain - Patient had questionable episode of chest pain 9/19. However, she has had mental status changes so difficult to get reliable history.  - EKG showed showed normal sinus rhythm, rate 86 bpm, with T wave inversions/mild ST depression in anterolateral leads. She has had transient ST/T changes  in these leads before.. - Troponin 44>44, not consistent with ACS - Echo 9/20 with normal biventricular function, no regional wall motion abnormalities, mild left atrial dilatation, no significant valvular disease. -Recommend outpatient follow-up.  Can consider ischemia evaluation as outpatient if having further chest pain once encephalopathy resolves   Hypertension - BP very labile  -Continue metoprolol 25 mg  twice daily   PAD Necrotic Pseudoaneurysm - Underwent fistulogram on 11/11/2020  found to have a necrotic pseudoaneurysm in the forearm subtotally occluded innominate stent which was treated with drug-coated balloon angioplasty. Patient was admitted and returned to the OR on 11/12/2020 for resection of distal fistula and conversion to upper arm fistula on the right. - Management per Vascular Surgery.   ESRD On Hemodialysis - Management per Nephrology.   Mental Status Change - Patient noted to have acute mental status change - staring off to space and having delayed responses. Also had some jerking of her upper body e - Brain MRI showed no acute abnormalities - Neurology following  Otherwise, per primary team: - Thrombocytopenia - Anemia of chronic kidney disease - HIV  [facility] will sign off.   Medication Recommendations:  Eliquis 2.5 mg BID, metoprolol 25 mg BID Other recommendations (labs, testing, etc):  None Follow up as an outpatient:  Appointment with Dr Harrington Challenger on 10/28   For questions or updates, please contact [facility] Please consult [email or web address] for contact info under        Signed, Donato Heinz, MD  11/22/2020, 10:26 AM

## 2020-11-22 NOTE — Progress Notes (Signed)
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  Patient receives dialysis on Tuesday Thursday Saturday basis.  Next dialysis treatment will be 11/22/2020.  Usual dialysis centers dialysis [facility]  Blood pressure 134/80 pulse 79 temperature 98.5 O2 sats 100%  Sodium 138 potassium 3.9 chloride 97 CO2 27 BUN 39 creatinine 7.9 glucose 122 calcium 12.4  Underwent dialysis 11/21/2020 Next dialysis be 11/23/2020      Objective:  Vital signs in last 24 hours:  Temp:  [97.8 F (36.6 C)-98.7 F (37.1 C)] 98.5 F (36.9 C) (09/23 0317) Pulse Rate:  [77-98] 77 (09/23 0317) Resp:  [16-37] 20 (09/23 0317) BP: (80-154)/(39-97) 154/80 (09/23 0317) SpO2:  [94 %-100 %] 97 % (09/23 0317) Weight:  [43 kg] 43 kg (09/22 1451)  Weight change:  Filed Weights   11/19/20 1217 11/20/20 0644 11/21/20 1451  Weight: 41.7 kg 42.8 kg 43 kg    Intake/Output: I/O last 3 completed shifts: In: 630.5 [P.O.:480; I.V.:150.5] Out: 104 [Other:103; Stool:1]   Intake/Output this shift:  No intake/output data recorded.  Gen:nad CVS:rrr Resp:normal wob OEH:OZYY, nt Ext: RUE swelling Neuro: awake, alert HD access: RUE AVF, woundvac+dressing in place   Basic Metabolic Panel: Recent Labs  Lab 11/18/20 0334 11/19/20 0125 11/21/20 0935 11/21/20 1330  NA 133* 135 137 138  K 3.9 4.2 3.6 3.9  CL 96* 97* 95* 97*  CO2 28 27 27 27   GLUCOSE 77 77 137* 122*  BUN 21 29* 34* 39*  CREATININE 6.18* 8.06* 7.69* 7.92*  CALCIUM 9.7 10.1 10.6* 10.2  PHOS 2.9  --   --  2.6     Liver Function Tests: Recent Labs  Lab 11/18/20 0334 11/21/20 0935 11/21/20 1330  AST  --  20  --   ALT  --  11  --   ALKPHOS  --  83  --   BILITOT  --  0.6  --   PROT  --  8.5*  --   ALBUMIN 2.5* 3.2* 3.0*    No results for input(s): LIPASE, AMYLASE in the  last 168 hours. Recent Labs  Lab 11/21/20 0935  AMMONIA 13    CBC: Recent Labs  Lab 11/18/20 0334 11/19/20 0125 11/21/20 0453 11/21/20 1330 11/22/20 0216  WBC 3.2* 3.9* 9.4 10.9* 9.3  HGB 7.0* 7.7* 9.5* 8.2* 12.4  HCT 23.4* 25.4* 31.2* 27.8* 41.5  MCV 108.3* 105.4* 105.4* 107.3* 106.7*  PLT 216 243 350 328 263     Cardiac Enzymes: No results for input(s): CKTOTAL, CKMB, CKMBINDEX, TROPONINI in the last 168 hours.  BNP: Invalid input(s): POCBNP  CBG: Recent Labs  Lab 11/17/20 0628 11/18/20 0850 11/20/20 1414 11/21/20 1008 11/21/20 1131  GLUCAP 74 94 95 168* 143*     Microbiology: Results for orders placed or performed during the hospital encounter of 11/11/20  SARS CORONAVIRUS 2 (TAT 6-24 HRS) Nasopharyngeal Nasopharyngeal Swab     Status: None   Collection Time: 11/12/20  3:06 AM   Specimen: Nasopharyngeal Swab  Result Value Ref Range Status   SARS Coronavirus 2 NEGATIVE NEGATIVE Final    Comment: (NOTE) SARS-CoV-2 target nucleic acids are NOT DETECTED.  The SARS-CoV-2 RNA is generally detectable in upper and lower respiratory specimens during the acute phase of infection. Negative results do not preclude SARS-CoV-2 infection, do not rule out co-infections with other pathogens, and  should not be used as the sole basis for treatment or other patient management decisions. Negative results must be combined with clinical observations, patient history, and epidemiological information. The expected result is Negative.  Fact Sheet for Patients: SugarRoll.be  Fact Sheet for Healthcare Providers: https://www.woods-mathews.com/  This test is not yet approved or cleared by the [place] FDA and  has been authorized for detection and/or diagnosis of SARS-CoV-2 by FDA under an Emergency Use Authorization (EUA). This EUA will remain  in effect (meaning this test can be used) for the duration of the COVID-19  declaration under Se ction 564(b)(1) of the Act, 21 U.S.C. section 360bbb-3(b)(1), unless the authorization is terminated or revoked sooner.  Performed at [facility], [place] [address], [place], [place] [address]   Surgical pcr screen     Status: Abnormal   Collection Time: 11/12/20  4:52 AM   Specimen: Nasal Mucosa; Nasal Swab  Result Value Ref Range Status   MRSA, PCR POSITIVE (A) NEGATIVE Final    Comment: RESULT CALLED TO, READ BACK BY AND VERIFIED WITH: RN Kristin Bruins DAVIDSON 11/12/20@6 :32 BY TW    Staphylococcus aureus POSITIVE (A) NEGATIVE Final    Comment: RESULT CALLED TO, READ BACK BY AND VERIFIED WITH: RN Kristin Bruins DAVIDSON 11/12/20@6 :32 BY TW (NOTE) The Xpert SA Assay (FDA approved for NASAL specimens in patients 102 years of age and older), is one component of a comprehensive surveillance program. It is not intended to diagnose infection nor to guide or monitor treatment. Performed at [facility], [place] [address], [place], [place] [address]   SARS CORONAVIRUS 2 (TAT 6-24 HRS) Nasopharyngeal Nasopharyngeal Swab     Status: None   Collection Time: 11/15/20  5:20 PM   Specimen: Nasopharyngeal Swab  Result Value Ref Range Status   SARS Coronavirus 2 NEGATIVE NEGATIVE Final    Comment: (NOTE) SARS-CoV-2 target nucleic acids are NOT DETECTED.  The SARS-CoV-2 RNA is generally detectable in upper and lower respiratory specimens during the acute phase of infection. Negative results do not preclude SARS-CoV-2 infection, do not rule out co-infections with other pathogens, and should not be used as the sole basis for treatment or other patient management decisions. Negative results must be combined with clinical observations, patient history, and epidemiological information. The expected result is Negative.  Fact Sheet for Patients: SugarRoll.be  Fact Sheet for Healthcare Providers: https://www.woods-mathews.com/  This test is not  yet approved or cleared by the [place] FDA and  has been authorized for detection and/or diagnosis of SARS-CoV-2 by FDA under an Emergency Use Authorization (EUA). This EUA will remain  in effect (meaning this test can be used) for the duration of the COVID-19 declaration under Se ction 564(b)(1) of the Act, 21 U.S.C. section 360bbb-3(b)(1), unless the authorization is terminated or revoked sooner.  Performed at [facility], [place] [address], [place], [place] [address]     Coagulation Studies: No results for input(s): LABPROT, INR in the last 72 hours.  Urinalysis: No results for input(s): COLORURINE, LABSPEC, PHURINE, GLUCOSEU, HGBUR, BILIRUBINUR, KETONESUR, PROTEINUR, UROBILINOGEN, NITRITE, LEUKOCYTESUR in the last 72 hours.  Invalid input(s): APPERANCEUR    Imaging: EEG adult  Result Date: 11/20/2020 Derek Jack, MD     11/20/2020  7:23 PM Routine EEG Report [name] is a 70 y.o. female with a history of altered mental status who is undergoing an EEG to evaluate for seizures. Report: This EEG was acquired with electrodes placed according to the International  10-20 electrode system (including Fp1, Fp2, F3, F4, C3, C4, P3, P4, O1, O2, T3, T4, T5, T6, A1, A2, Fz, Cz, Pz). The following electrodes were missing or displaced: none. The best background was 6 Hz with intermittent diffuse slowing in the delta range. This activity is reactive to stimulation. Drowsiness was manifested by background fragmentation; deeper stages of sleep were manifested by K complexes and sleep spindles. There was no focal slowing. There were no interictal epileptiform discharges. There were no electrographic seizures identified. Photic stimulation and hyperventilation were not performed. Impression and clinical correlation: This EEG was obtained while awake and asleep and is abnormal due to moderate-to-severe diffuse slowing indicating global cerebral dysfunction. Su Monks, MD [facility] [phone] If 7pm- 7am, please page neurology on call as listed in [place].     Medications:    sodium chloride     heparin 700 Units/hr (11/20/20 2058)    vitamin C  500 mg Oral Q12H   calcium acetate  1,334 mg Oral TID WC   Chlorhexidine Gluconate Cloth  6 each Topical Q0600   cinacalcet  60 mg Oral Q T,Th,Sa-HD   clopidogrel  75 mg Oral Q breakfast   darbepoetin (ARANESP) injection - DIALYSIS  100 mcg Intravenous Q Thu-HD   dolutegravir  50 mg Oral q1600   feeding supplement  237 mL Oral BID BM   lamiVUDine  50 mg Oral QAC breakfast   melatonin  3 mg Oral Q24H   metoprolol tartrate  25 mg Oral BID   mirtazapine  7.5 mg Oral QHS   multivitamin  1 tablet Oral QHS   pantoprazole  40 mg Oral BID AC   senna  2 tablet Oral Q24H   sodium chloride flush  3 mL Intravenous Q12H   venlafaxine XR  150 mg Oral Q breakfast   zidovudine  100 mg Oral 3 times per day   sodium chloride, acetaminophen, calcitRIOL, calcium acetate, hydrALAZINE, metoprolol tartrate, naLOXone (NARCAN)  injection, nitroGLYCERIN, ondansetron (ZOFRAN) IV, ondansetron, sodium chloride flush  Assessment/ Plan:   ESRD:  next dialysis to be 11/23/2020 Volume/ hypertension: . UF as tolerated Anemia of Chronic Kidney Disease: Continue to follow Aranesp initiated in hospital  Secondary Hyperparathyroidism/Hyperphosphatemia: Continue with binders; receives cinacalcet 60mg  and calcitriol 1.49mcg TIW  Vascular access: Right upper extremity fistula with good bruit, slightly pulsatile s/p rt cimino resection + ligation prox/distal + collaterals.. Have been able to cannulate Dispo: pending d/c back to SNF, PT/OT  LOS: [place] @TODAY @7 :59 AM

## 2020-11-22 NOTE — Progress Notes (Signed)
[place] for Heparin Indication: atrial fibrillation  Allergies  Allergen Reactions   Lactose Intolerance (Gi) Other (See Comments)    On MAR   Latex Rash and Itching   Penicillins Rash and Swelling    Has patient had a PCN reaction causing immediate rash, facial/tongue/throat swelling, SOB or lightheadedness with hypotension: No Has patient had a PCN reaction causing severe rash involving mucus membranes or skin necrosis: No Has patient had a PCN reaction that required hospitalization No Has patient had a PCN reaction occurring within the last 10 years: No If all of the above answers are "NO", then may proceed with Cephalosporin use.      Patient Measurements: Height: 5\' 5"  (165.1 cm) Weight:  (bed scale with glitch) IBW/kg (Calculated) : 57  Heparin Dosing Weight: 47.7 kg  Vital Signs: Temp: 98.5 F (36.9 C) (09/23 0317) Temp Source: Oral (09/23 0317) BP: 154/80 (09/23 0317) Pulse Rate: 77 (09/23 0317)  Labs: Recent Labs    11/21/20 0453 11/21/20 0935 11/21/20 1216 11/21/20 1330 11/22/20 0216 11/22/20 0245  HGB 9.5*  --   --  8.2* 12.4  --   HCT 31.2*  --   --  27.8* 41.5  --   PLT 350  --   --  328 263  --   HEPARINUNFRC 0.65  --  0.57  --   --  0.42  CREATININE  --  7.69*  --  7.92*  --   --      Estimated Creatinine Clearance: 4.7 mL/min (A) (by C-G formula based on SCr of 7.92 mg/dL (H)).   Medications:  Scheduled:   Infusions:   sodium chloride     heparin 700 Units/hr (11/20/20 2058)    Assessment: 70 y.o. female with a history of isolated episode of atrial flutter in 2016 not on anticoagulation, prior TIA, PAD with prior stenting to right innominate artery, hypertension, ESRD on hemodialysis, thrombocytopenia, GERD, HIV, and chronic pain who is being seen for the evaluation of chest pain now found to be in Aflutter w/ RVR. Pharmacy is consulted to initiate IV heparin for anticoagulation.   No  anticoagulation prior to admission. Patient has anemia at baseline. Will conservatively dose heparin with lower bolus and watch CBC closely. No s/sx bleeding currently per chart review.   Heparin level this afternoon remains at goal level this morning.  No overt bleeding or complications noted. Hgb up today although no PRBCs charted?  Pharmacy asked to transition to po Eliquis today.  Goal of Therapy:  Heparin level 0.3-0.7 units/ml Monitor platelets by anticoagulation protocol: Yes   Plan:  Stop IV Heparin. Start Eliquis 2.5 mg BID (reduced dose for ESRD + weight < 60 kg). Will educate pt on Eliquis prior to discharge.  [place] Crane, Roylene Reason, BCCP Clinical Pharmacist  11/22/2020 8:49 AM   [facility] pharmacy phone numbers are listed on [email or web address]

## 2020-11-22 NOTE — Consult Note (Signed)
[place] Nurse wound follow up Patient receiving care in [place]. Wound type: RFA surgical wound Measurement: deferred Wound bed: 100% pink, VERY superficial/shallow Drainage (amount, consistency, odor) minimal in cannister Periwound: 7 staples above the open area; 11 staples below the open wound. All intact. Dressing procedure/placement/frequency: One piece of Mepitel used to cover all the staples and the wound bed. One small piece of black foam placed over the open wound area. Drape applied, immediate seal obtained. Patient tolerated very well.  It is very likely the [facility] can be discontinued Monday.  Val Riles, RN, MSN, CWOCN, CNS-BC, pager [phone]

## 2020-11-23 LAB — RENAL FUNCTION PANEL
Albumin: 2.9 g/dL — ABNORMAL LOW (ref 3.5–5.0)
Anion gap: 10 (ref 5–15)
BUN: 36 mg/dL — ABNORMAL HIGH (ref 8–23)
CO2: 26 mmol/L (ref 22–32)
Calcium: 10 mg/dL (ref 8.9–10.3)
Chloride: 98 mmol/L (ref 98–111)
Creatinine, Ser: 7.08 mg/dL — ABNORMAL HIGH (ref 0.44–1.00)
GFR, Estimated: 6 mL/min — ABNORMAL LOW (ref >60)
Glucose, Bld: 74 mg/dL (ref 70–99)
Phosphorus: 2.8 mg/dL (ref 2.5–4.6)
Potassium: 4 mmol/L (ref 3.5–5.1)
Sodium: 134 mmol/L — ABNORMAL LOW (ref 135–145)

## 2020-11-23 LAB — CBC
HCT: 27.3 % — ABNORMAL LOW (ref 36.0–46.0)
Hemoglobin: 8.3 g/dL — ABNORMAL LOW (ref 12.0–15.0)
MCH: 32.7 pg (ref 26.0–34.0)
MCHC: 30.4 g/dL (ref 30.0–36.0)
MCV: 107.5 fL — ABNORMAL HIGH (ref 80.0–100.0)
Platelets: 279 10*3/uL (ref 150–400)
RBC: 2.54 MIL/uL — ABNORMAL LOW (ref 3.87–5.11)
RDW: 16.5 % — ABNORMAL HIGH (ref 11.5–15.5)
WBC: 7 10*3/uL (ref 4.0–10.5)
nRBC: 0.3 % — ABNORMAL HIGH (ref 0.0–0.2)

## 2020-11-23 MED ORDER — HEPARIN SODIUM (PORCINE) 1000 UNIT/ML DIALYSIS: 1000.0000 [IU] | INTRAMUSCULAR | Status: DC | PRN

## 2020-11-23 MED ORDER — SODIUM CHLORIDE 0.9 % IV SOLN
100.0000 mL | INTRAVENOUS | Status: DC | PRN
Start: 2020-11-23 — End: 2020-11-23

## 2020-11-23 MED ORDER — LIDOCAINE HCL (PF) 1 % IJ SOLN: 5.0000 mL | INTRAMUSCULAR | Status: DC | PRN

## 2020-11-23 MED ORDER — ALTEPLASE 2 MG IJ SOLR: 2.0000 mg | Freq: Once | INTRAMUSCULAR | Status: DC | PRN

## 2020-11-23 MED ORDER — PENTAFLUOROPROP-TETRAFLUOROETH EX AERO
1.0000 | INHALATION_SPRAY | CUTANEOUS | Status: DC | PRN
Start: 2020-11-23 — End: 2020-11-23

## 2020-11-23 MED ORDER — SODIUM CHLORIDE 0.9 % IV SOLN: 100.0000 mL | INTRAVENOUS | Status: DC | PRN

## 2020-11-23 MED ORDER — LIDOCAINE-PRILOCAINE 2.5-2.5 % EX CREA: 1.0000 "application " | TOPICAL_CREAM | CUTANEOUS | Status: DC | PRN

## 2020-11-23 NOTE — Discharge Instructions (Signed)

## 2020-11-23 NOTE — Progress Notes (Addendum)
  Progress Note    11/23/2020 8:19 AM 11 Days Post-Op  Subjective:  very quiet this morning, really had to provoke her to talk. Likely still due to her encephalopathy. She does report pain in right arm.    Vitals:   11/23/20 0413 11/23/20 0756  BP: (!) 116/55 (!) 122/59  Pulse: 66 73  Resp: 15 19  Temp: 97.8 F (36.6 C) 98 F (36.7 C)  SpO2: 96% 97%   Physical Exam: Cardiac:  regular Lungs:  non labored Incisions:  right upper arm incisions intact with staples, wound vac with good suction, minimal output Extremities:  right upper extremity edematous, tender to palpation. Incisions intact. Wound vac to suction. Right hand warmer today. 2+ radial pulse Neurologic: alert and confused  CBC    Component Value Date/Time   WBC 7.0 11/23/2020 0556   RBC 2.54 (L) 11/23/2020 0556   HGB 8.3 (L) 11/23/2020 0556   HCT 27.3 (L) 11/23/2020 0556   PLT 279 11/23/2020 0556   MCV 107.5 (H) 11/23/2020 0556   MCH 32.7 11/23/2020 0556   MCHC 30.4 11/23/2020 0556   RDW 16.5 (H) 11/23/2020 0556   LYMPHSABS 0.6 (L) 10/18/2019 0941   MONOABS 0.3 10/18/2019 0941   EOSABS 0.1 10/18/2019 0941   BASOSABS 0.0 10/18/2019 0941    BMET    Component Value Date/Time   NA 138 11/21/2020 1330   K 3.9 11/21/2020 1330   CL 97 (L) 11/21/2020 1330   CO2 27 11/21/2020 1330   GLUCOSE 122 (H) 11/21/2020 1330   BUN 39 (H) 11/21/2020 1330   CREATININE 7.92 (H) 11/21/2020 1330   CREATININE 5.66 (H) 12/22/2019 1117   CALCIUM 10.2 11/21/2020 1330   CALCIUM 9.0 03/12/2011 0527   GFRNONAA 5 (L) 11/21/2020 1330   GFRNONAA 5 (L) 02/12/2015 1000   GFRAA 9 (L) 10/22/2019 0105   GFRAA 5 (L) 02/12/2015 1000    INR    Component Value Date/Time   INR 1.14 12/28/2011 1403    No intake or output data in the 24 hours ending 11/23/20 0819   Assessment/Plan:  70 y.o. female is s/p conversion to upper arm fistula on the right and resection of distal fistula for the necrotic pseudoaneurysm 11 Days Post-Op    Wound VAC changed yesterday. Next change Monday RUE well perfused with palpable radial pulse Good thrill in fistula Next HD today Appreciate cardiology assistance Atrial flutter remains rate controlled on metoprolol Transitioned to Eliquis 2.5 mg BID. Encephalopathy. Appreciate Neurology assistance. Recommending outpatient follow up PT/OT recommend SNF  Appreciate [facility] assisting with SNF placement   Marval [facility] [phone] 11/23/2020 8:19 AM  I have independently interviewed and examined patient and agree with PA assessment and plan above.   Avamae Dehaan C. Donzetta Matters, MD [facility] [place] Office: [phone] Pager: [phone]

## 2020-11-23 NOTE — Progress Notes (Signed)
Date and time results received: 11/23/20 0145   Test: Lab Critical Value: Hgb 8.4   Actions Taken: significant drop, no visible signs of bleeding, will recheck

## 2020-11-23 NOTE — Progress Notes (Signed)
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  Patient receives dialysis on Tuesday Thursday Saturday basis.  Next dialysis treatment will be 11/22/2020.  Usual dialysis centers dialysis [facility]  Blood pressure 122/59 pulse 73 temperature 98 O2 sats 94% room air  Labs pending.  Underwent dialysis 11/21/2020 Next dialysis be 11/23/2020      Objective:  Vital signs in last 24 hours:  Temp:  [97.1 F (36.2 C)-99 F (37.2 C)] 98 F (36.7 C) (09/24 0756) Pulse Rate:  [66-79] 73 (09/24 0756) Resp:  [15-23] 19 (09/24 0756) BP: (108-125)/(52-61) 122/59 (09/24 0756) SpO2:  [96 %-100 %] 97 % (09/24 0756) Weight:  [46.2 kg] 46.2 kg (09/24 0413)  Weight change: 3.2 kg Filed Weights   11/20/20 0644 11/21/20 1451 11/23/20 0413  Weight: 42.8 kg 43 kg 46.2 kg    Intake/Output: I/O last 3 completed shifts: In: -  Out: 1 [Stool:1]   Intake/Output this shift:  No intake/output data recorded.  Gen:nad CVS:rrr Resp:normal wob TGG:YIRS, nt Ext: RUE swelling Neuro: awake, alert HD access: RUE AVF, woundvac+dressing in place   Basic Metabolic Panel: Recent Labs  Lab 11/18/20 0334 11/19/20 0125 11/21/20 0935 11/21/20 1330  NA 133* 135 137 138  K 3.9 4.2 3.6 3.9  CL 96* 97* 95* 97*  CO2 28 27 27 27   GLUCOSE 77 77 137* 122*  BUN 21 29* 34* 39*  CREATININE 6.18* 8.06* 7.69* 7.92*  CALCIUM 9.7 10.1 10.6* 10.2  PHOS 2.9  --   --  2.6     Liver Function Tests: Recent Labs  Lab 11/18/20 0334 11/21/20 0935 11/21/20 1330  AST  --  20  --   ALT  --  11  --   ALKPHOS  --  83  --   BILITOT  --  0.6  --   PROT  --  8.5*  --   ALBUMIN 2.5* 3.2* 3.0*    No results for input(s): LIPASE, AMYLASE in the last 168 hours. Recent Labs  Lab 11/21/20 0935  AMMONIA 13     CBC: Recent Labs  Lab  11/19/20 0125 11/21/20 0453 11/21/20 1330 11/22/20 0216 11/23/20 0556  WBC 3.9* 9.4 10.9* 9.3 7.0  HGB 7.7* 9.5* 8.2* 12.4 8.3*  HCT 25.4* 31.2* 27.8* 41.5 27.3*  MCV 105.4* 105.4* 107.3* 106.7* 107.5*  PLT 243 350 328 263 279     Cardiac Enzymes: No results for input(s): CKTOTAL, CKMB, CKMBINDEX, TROPONINI in the last 168 hours.  BNP: Invalid input(s): POCBNP  CBG: Recent Labs  Lab 11/17/20 0628 11/18/20 0850 11/20/20 1414 11/21/20 1008 11/21/20 1131  GLUCAP 74 94 95 168* 143*     Microbiology: Results for orders placed or performed during the hospital encounter of 11/11/20  SARS CORONAVIRUS 2 (TAT 6-24 HRS) Nasopharyngeal Nasopharyngeal Swab     Status: None   Collection Time: 11/12/20  3:06 AM   Specimen: Nasopharyngeal Swab  Result Value Ref Range Status   SARS Coronavirus 2 NEGATIVE NEGATIVE Final    Comment: (NOTE) SARS-CoV-2 target nucleic acids are NOT DETECTED.  The SARS-CoV-2 RNA is generally detectable in upper and lower respiratory specimens during the acute phase of infection. Negative results do not preclude SARS-CoV-2 infection, do not rule out co-infections with other pathogens, and should not be used as the sole basis for treatment or other  patient management decisions. Negative results must be combined with clinical observations, patient history, and epidemiological information. The expected result is Negative.  Fact Sheet for Patients: SugarRoll.be  Fact Sheet for Healthcare Providers: https://www.woods-mathews.com/  This test is not yet approved or cleared by the [place] FDA and  has been authorized for detection and/or diagnosis of SARS-CoV-2 by FDA under an Emergency Use Authorization (EUA). This EUA will remain  in effect (meaning this test can be used) for the duration of the COVID-19 declaration under Se ction 564(b)(1) of the Act, 21 U.S.C. section 360bbb-3(b)(1), unless the  authorization is terminated or revoked sooner.  Performed at [facility], [place] [address], [place], [place] [address]   Surgical pcr screen     Status: Abnormal   Collection Time: 11/12/20  4:52 AM   Specimen: Nasal Mucosa; Nasal Swab  Result Value Ref Range Status   MRSA, PCR POSITIVE (A) NEGATIVE Final    Comment: RESULT CALLED TO, READ BACK BY AND VERIFIED WITH: RN Kristin Bruins DAVIDSON 11/12/20@6 :32 BY TW    Staphylococcus aureus POSITIVE (A) NEGATIVE Final    Comment: RESULT CALLED TO, READ BACK BY AND VERIFIED WITH: RN Kristin Bruins DAVIDSON 11/12/20@6 :32 BY TW (NOTE) The Xpert SA Assay (FDA approved for NASAL specimens in patients 39 years of age and older), is one component of a comprehensive surveillance program. It is not intended to diagnose infection nor to guide or monitor treatment. Performed at [facility], [place] [address] [place], [place] [address]   SARS CORONAVIRUS 2 (TAT 6-24 HRS) Nasopharyngeal Nasopharyngeal Swab     Status: None   Collection Time: 11/15/20  5:20 PM   Specimen: Nasopharyngeal Swab  Result Value Ref Range Status   SARS Coronavirus 2 NEGATIVE NEGATIVE Final    Comment: (NOTE) SARS-CoV-2 target nucleic acids are NOT DETECTED.  The SARS-CoV-2 RNA is generally detectable in upper and lower respiratory specimens during the acute phase of infection. Negative results do not preclude SARS-CoV-2 infection, do not rule out co-infections with other pathogens, and should not be used as the sole basis for treatment or other patient management decisions. Negative results must be combined with clinical observations, patient history, and epidemiological information. The expected result is Negative.  Fact Sheet for Patients: SugarRoll.be  Fact Sheet for Healthcare Providers: https://www.woods-mathews.com/  This test is not yet approved or cleared by the [place] FDA and  has been authorized for detection  and/or diagnosis of SARS-CoV-2 by FDA under an Emergency Use Authorization (EUA). This EUA will remain  in effect (meaning this test can be used) for the duration of the COVID-19 declaration under Se ction 564(b)(1) of the Act, 21 U.S.C. section 360bbb-3(b)(1), unless the authorization is terminated or revoked sooner.  Performed at [facility], [place] [address], [place], [place] [address]     Coagulation Studies: No results for input(s): LABPROT, INR in the last 72 hours.  Urinalysis: No results for input(s): COLORURINE, LABSPEC, PHURINE, GLUCOSEU, HGBUR, BILIRUBINUR, KETONESUR, PROTEINUR, UROBILINOGEN, NITRITE, LEUKOCYTESUR in the last 72 hours.  Invalid input(s): APPERANCEUR    Imaging: No results found.   Medications:    sodium chloride      apixaban  2.5 mg Oral BID   vitamin C  500 mg Oral Q12H   calcium acetate  1,334 mg Oral TID WC   Chlorhexidine Gluconate Cloth  6 each Topical Q0600   cinacalcet  60 mg Oral Q T,Th,Sa-HD   clopidogrel  75 mg Oral Q breakfast  darbepoetin (ARANESP) injection - DIALYSIS  100 mcg Intravenous Q Thu-HD   dolutegravir  50 mg Oral q1600   feeding supplement  237 mL Oral BID BM   lamiVUDine  50 mg Oral QAC breakfast   melatonin  3 mg Oral Q24H   metoprolol tartrate  25 mg Oral BID   mirtazapine  7.5 mg Oral QHS   multivitamin  1 tablet Oral QHS   pantoprazole  40 mg Oral BID AC   senna  2 tablet Oral Q24H   sodium chloride flush  3 mL Intravenous Q12H   venlafaxine XR  150 mg Oral Q breakfast   zidovudine  100 mg Oral 3 times per day   sodium chloride, acetaminophen, calcitRIOL, calcium acetate, hydrALAZINE, metoprolol tartrate, naLOXone (NARCAN)  injection, nitroGLYCERIN, ondansetron (ZOFRAN) IV, ondansetron, sodium chloride flush  Assessment/ Plan:   ESRD:  next dialysis to be 11/23/2020 Volume/ hypertension: . UF as tolerated Anemia of Chronic Kidney Disease: Continue to follow Aranesp initiated in hospital  Secondary  Hyperparathyroidism/Hyperphosphatemia: Continue with binders; receives cinacalcet 60mg  and calcitriol 1.80mcg TIW  Vascular access: Right upper extremity fistula with good bruit, slightly pulsatile s/p rt cimino resection + ligation prox/distal + collaterals.. Have been able to cannulate Dispo: pending d/c back to SNF, PT/OT  LOS: [place] @TODAY @8 :92 AM

## 2020-11-24 LAB — CBC
HCT: 28.5 % — ABNORMAL LOW (ref 36.0–46.0)
Hemoglobin: 8.4 g/dL — ABNORMAL LOW (ref 12.0–15.0)
MCH: 31.9 pg (ref 26.0–34.0)
MCHC: 29.5 g/dL — ABNORMAL LOW (ref 30.0–36.0)
MCV: 108.4 fL — ABNORMAL HIGH (ref 80.0–100.0)
Platelets: 332 10*3/uL (ref 150–400)
RBC: 2.63 MIL/uL — ABNORMAL LOW (ref 3.87–5.11)
RDW: 16.8 % — ABNORMAL HIGH (ref 11.5–15.5)
WBC: 7 10*3/uL (ref 4.0–10.5)
nRBC: 0.9 % — ABNORMAL HIGH (ref 0.0–0.2)

## 2020-11-24 NOTE — Progress Notes (Signed)
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  Patient receives dialysis on Tuesday Thursday Saturday basis.  Next dialysis treatment will be 11/22/2020.  Usual dialysis centers dialysis [facility]  Blood pressure 115/50 pulse 70 temperature 97.7  Sodium 134 potassium 4 chloride 98 CO2 26 BUN 36 creatinine 7 hemoglobin 8.4 calcium 10 phosphorus two-point  Underwent dialysis 11/23/2020 Next dialysis will be 11/26/2020      Objective:  Vital signs in last 24 hours:  Temp:  [97.7 F (36.5 C)-98.8 F (37.1 C)] 97.7 F (36.5 C) (09/25 0755) Pulse Rate:  [69-115] 70 (09/25 0755) Resp:  [14-25] 14 (09/25 0755) BP: (102-128)/(50-70) 115/50 (09/25 0755) SpO2:  [95 %-100 %] 97 % (09/25 0755) Weight:  [40.5 kg-41.5 kg] (P) 40.5 kg (09/24 1502)  Weight change: -4.7 kg Filed Weights   11/23/20 0413 11/23/20 1217 11/23/20 1502  Weight: 46.2 kg 41.5 kg (P) 40.5 kg    Intake/Output: I/O last 3 completed shifts: In: 0  Out: 927 [Other:927]   Intake/Output this shift:  No intake/output data recorded.  Gen:nad CVS:rrr Resp:normal wob LNL:GXQJ, nt Ext: RUE swelling Neuro: awake, alert HD access: RUE AVF, woundvac+dressing in place   Basic Metabolic Panel: Recent Labs  Lab 11/18/20 0334 11/19/20 0125 11/21/20 0935 11/21/20 1330 11/23/20 1235  NA 133* 135 137 138 134*  K 3.9 4.2 3.6 3.9 4.0  CL 96* 97* 95* 97* 98  CO2 28 27 27 27 26   GLUCOSE 77 77 137* 122* 74  BUN 21 29* 34* 39* 36*  CREATININE 6.18* 8.06* 7.69* 7.92* 7.08*  CALCIUM 9.7 10.1 10.6* 10.2 10.0  PHOS 2.9  --   --  2.6 2.8     Liver Function Tests: Recent Labs  Lab 11/18/20 0334 11/21/20 0935 11/21/20 1330 11/23/20 1235  AST  --  20  --   --   ALT  --  11  --   --   ALKPHOS  --  83  --   --   BILITOT  --  0.6  --   --   PROT   --  8.5*  --   --   ALBUMIN 2.5* 3.2* 3.0* 2.9*    No results for input(s): LIPASE, AMYLASE in the last 168 hours. Recent Labs  Lab 11/21/20 0935  AMMONIA 13     CBC: Recent Labs  Lab 11/21/20 0453 11/21/20 1330 11/22/20 0216 11/23/20 0556 11/24/20 0114  WBC 9.4 10.9* 9.3 7.0 7.0  HGB 9.5* 8.2* 12.4 8.3* 8.4*  HCT 31.2* 27.8* 41.5 27.3* 28.5*  MCV 105.4* 107.3* 106.7* 107.5* 108.4*  PLT 350 328 263 279 332     Cardiac Enzymes: No results for input(s): CKTOTAL, CKMB, CKMBINDEX, TROPONINI in the last 168 hours.  BNP: Invalid input(s): POCBNP  CBG: Recent Labs  Lab 11/18/20 0850 11/20/20 1414 11/21/20 1008 11/21/20 1131  GLUCAP 94 95 168* 143*     Microbiology: Results for orders placed or performed during the hospital encounter of 11/11/20  SARS CORONAVIRUS 2 (TAT 6-24 HRS) Nasopharyngeal Nasopharyngeal Swab     Status: None   Collection Time: 11/12/20  3:06 AM   Specimen: Nasopharyngeal Swab  Result Value Ref Range Status   SARS Coronavirus 2 NEGATIVE NEGATIVE Final    Comment: (NOTE) SARS-CoV-2 target nucleic acids are NOT DETECTED.  The SARS-CoV-2 RNA is generally  detectable in upper and lower respiratory specimens during the acute phase of infection. Negative results do not preclude SARS-CoV-2 infection, do not rule out co-infections with other pathogens, and should not be used as the sole basis for treatment or other patient management decisions. Negative results must be combined with clinical observations, patient history, and epidemiological information. The expected result is Negative.  Fact Sheet for Patients: SugarRoll.be  Fact Sheet for Healthcare Providers: https://www.woods-mathews.com/  This test is not yet approved or cleared by the [place] FDA and  has been authorized for detection and/or diagnosis of SARS-CoV-2 by FDA under an Emergency Use Authorization (EUA). This EUA will remain   in effect (meaning this test can be used) for the duration of the COVID-19 declaration under Se ction 564(b)(1) of the Act, 21 U.S.C. section 360bbb-3(b)(1), unless the authorization is terminated or revoked sooner.  Performed at [facility], [place] [address] [place], [place] [address]   Surgical pcr screen     Status: Abnormal   Collection Time: 11/12/20  4:52 AM   Specimen: Nasal Mucosa; Nasal Swab  Result Value Ref Range Status   MRSA, PCR POSITIVE (A) NEGATIVE Final    Comment: RESULT CALLED TO, READ BACK BY AND VERIFIED WITH: RN Kristin Bruins DAVIDSON 11/12/20@6 :32 BY TW    Staphylococcus aureus POSITIVE (A) NEGATIVE Final    Comment: RESULT CALLED TO, READ BACK BY AND VERIFIED WITH: RN Kristin Bruins DAVIDSON 11/12/20@6 :32 BY TW (NOTE) The Xpert SA Assay (FDA approved for NASAL specimens in patients 57 years of age and older), is one component of a comprehensive surveillance program. It is not intended to diagnose infection nor to guide or monitor treatment. Performed at [facility], [place] [address] [place], [place] [address]   SARS CORONAVIRUS 2 (TAT 6-24 HRS) Nasopharyngeal Nasopharyngeal Swab     Status: None   Collection Time: 11/15/20  5:20 PM   Specimen: Nasopharyngeal Swab  Result Value Ref Range Status   SARS Coronavirus 2 NEGATIVE NEGATIVE Final    Comment: (NOTE) SARS-CoV-2 target nucleic acids are NOT DETECTED.  The SARS-CoV-2 RNA is generally detectable in upper and lower respiratory specimens during the acute phase of infection. Negative results do not preclude SARS-CoV-2 infection, do not rule out co-infections with other pathogens, and should not be used as the sole basis for treatment or other patient management decisions. Negative results must be combined with clinical observations, patient history, and epidemiological information. The expected result is Negative.  Fact Sheet for Patients: SugarRoll.be  Fact Sheet for  Healthcare Providers: https://www.woods-mathews.com/  This test is not yet approved or cleared by the [place] FDA and  has been authorized for detection and/or diagnosis of SARS-CoV-2 by FDA under an Emergency Use Authorization (EUA). This EUA will remain  in effect (meaning this test can be used) for the duration of the COVID-19 declaration under Se ction 564(b)(1) of the Act, 21 U.S.C. section 360bbb-3(b)(1), unless the authorization is terminated or revoked sooner.  Performed at [facility], [place] [address], [place],  [address]     Coagulation Studies: No results for input(s): LABPROT, INR in the last 72 hours.  Urinalysis: No results for input(s): COLORURINE, LABSPEC, PHURINE, GLUCOSEU, HGBUR, BILIRUBINUR, KETONESUR, PROTEINUR, UROBILINOGEN, NITRITE, LEUKOCYTESUR in the last 72 hours.  Invalid input(s): APPERANCEUR    Imaging: No results found.   Medications:    sodium chloride      apixaban  2.5 mg Oral BID   vitamin C  500  mg Oral Q12H   calcium acetate  1,334 mg Oral TID WC   Chlorhexidine Gluconate Cloth  6 each Topical Q0600   cinacalcet  60 mg Oral Q T,Th,Sa-HD   clopidogrel  75 mg Oral Q breakfast   darbepoetin (ARANESP) injection - DIALYSIS  100 mcg Intravenous Q Thu-HD   dolutegravir  50 mg Oral q1600   feeding supplement  237 mL Oral BID BM   lamiVUDine  50 mg Oral QAC breakfast   melatonin  3 mg Oral Q24H   metoprolol tartrate  25 mg Oral BID   mirtazapine  7.5 mg Oral QHS   multivitamin  1 tablet Oral QHS   pantoprazole  40 mg Oral BID AC   senna  2 tablet Oral Q24H   sodium chloride flush  3 mL Intravenous Q12H   venlafaxine XR  150 mg Oral Q breakfast   zidovudine  100 mg Oral 3 times per day   sodium chloride, acetaminophen, calcitRIOL, calcium acetate, hydrALAZINE, metoprolol tartrate, naLOXone (NARCAN)  injection, nitroGLYCERIN, ondansetron (ZOFRAN) IV, ondansetron, sodium chloride flush  Assessment/ Plan:    ESRD:  next dialysis to be 11/26/2020 Volume/ hypertension: . UF as tolerated Anemia of Chronic Kidney Disease: Continue to follow Aranesp initiated in hospital  Secondary Hyperparathyroidism/Hyperphosphatemia: Continue with binders; receives cinacalcet 60mg  and calcitriol 1.75mcg TIW  Vascular access: Right upper extremity fistula with good bruit, slightly pulsatile s/p rt cimino resection + ligation prox/distal + collaterals.. Have been able to cannulate Dispo: pending d/c back to SNF, PT/OT  LOS: [place] @TODAY @10 :03 AM

## 2020-11-24 NOTE — Progress Notes (Addendum)
  Progress Note    11/24/2020 9:37 AM 12 Days Post-Op  Subjective:  sleepy   Vitals:   11/24/20 0414 11/24/20 0755  BP: (!) 122/58 (!) 115/50  Pulse: 71 70  Resp: 15 14  Temp: 97.9 F (36.6 C) 97.7 F (36.5 C)  SpO2: 98% 97%   Physical Exam: Cardiac:  regular Lungs:  non labored Incisions:  right arm incisions are clean, dry and intact. Wound vac to suction right forearm Extremities:  right upper extremity with chronic edema, mildly tender to palpation. Right hand warm Neurologic: sleepy  CBC    Component Value Date/Time   WBC 7.0 11/24/2020 0114   RBC 2.63 (L) 11/24/2020 0114   HGB 8.4 (L) 11/24/2020 0114   HCT 28.5 (L) 11/24/2020 0114   PLT 332 11/24/2020 0114   MCV 108.4 (H) 11/24/2020 0114   MCH 31.9 11/24/2020 0114   MCHC 29.5 (L) 11/24/2020 0114   RDW 16.8 (H) 11/24/2020 0114   LYMPHSABS 0.6 (L) 10/18/2019 0941   MONOABS 0.3 10/18/2019 0941   EOSABS 0.1 10/18/2019 0941   BASOSABS 0.0 10/18/2019 0941    BMET    Component Value Date/Time   NA 134 (L) 11/23/2020 1235   K 4.0 11/23/2020 1235   CL 98 11/23/2020 1235   CO2 26 11/23/2020 1235   GLUCOSE 74 11/23/2020 1235   BUN 36 (H) 11/23/2020 1235   CREATININE 7.08 (H) 11/23/2020 1235   CREATININE 5.66 (H) 12/22/2019 1117   CALCIUM 10.0 11/23/2020 1235   CALCIUM 9.0 03/12/2011 0527   GFRNONAA 6 (L) 11/23/2020 1235   GFRNONAA 5 (L) 02/12/2015 1000   GFRAA 9 (L) 10/22/2019 0105   GFRAA 5 (L) 02/12/2015 1000    INR    Component Value Date/Time   INR 1.14 12/28/2011 1403     Intake/Output Summary (Last 24 hours) at 11/24/2020 7681 Last data filed at 11/23/2020 1502 Gross per 24 hour  Intake --  Output 927 ml  Net -927 ml     Assessment/Plan:  70 y.o. female is s/p  female is s/p conversion to upper arm fistula on the right and resection of distal fistula for the necrotic pseudoaneurysm 12 Days Post-Op   Wound VAC changed Friday. Next change tomorrow 9/26 RUE well perfused with palpable  radial pulse Good thrill in fistula. Working well at HD Next HD Tuesday Appreciate cardiology assistance Atrial flutter remains rate controlled on metoprolol Continue Eliquis 2.5 mg BID. Encephalopathy. Outpatient follow up with neurology PT/OT recommend SNF  Pending SNF placement   Marval Regal [facility] [phone] 11/24/2020 9:37 AM.  I have independently interviewed and examined patient and agree with PA assessment and plan above.  She is much clearer neurologically this morning.  She is pending SNF placement.  Adianna Darwin C. Donzetta Matters, MD [facility] [place] Office: [phone] Pager: [phone]

## 2020-11-25 ENCOUNTER — Other Ambulatory Visit (HOSPITAL_COMMUNITY): Payer: Self-pay

## 2020-11-25 LAB — CBC
HCT: 29.9 % — ABNORMAL LOW (ref 36.0–46.0)
Hemoglobin: 9 g/dL — ABNORMAL LOW (ref 12.0–15.0)
MCH: 32.1 pg (ref 26.0–34.0)
MCHC: 30.1 g/dL (ref 30.0–36.0)
MCV: 106.8 fL — ABNORMAL HIGH (ref 80.0–100.0)
Platelets: 288 10*3/uL (ref 150–400)
RBC: 2.8 MIL/uL — ABNORMAL LOW (ref 3.87–5.11)
RDW: 16.6 % — ABNORMAL HIGH (ref 11.5–15.5)
WBC: 4.8 10*3/uL (ref 4.0–10.5)
nRBC: 0.6 % — ABNORMAL HIGH (ref 0.0–0.2)

## 2020-11-25 NOTE — Progress Notes (Signed)
  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  Patient receives dialysis on Tuesday Thursday Saturday basis. Usual dialysis centers dialysis [facility]  Assessment/ Plan:   ESRD:  next dialysis to be 11/26/2020 Volume/ hypertension: . UF as tolerated Anemia of Chronic Kidney Disease: Continue to follow Aranesp initiated in hospital  Secondary Hyperparathyroidism/Hyperphosphatemia: Continue with binders; receives cinacalcet 60mg  and calcitriol 1.46mcg TIW. Phos 2.8 on 9/25.  Vascular access: Right upper extremity fistula with good bruit, slightly pulsatile s/p rt cimino resection + ligation prox/distal + collaterals.. Have been able to cannulate. Wound vac removed from right forearm on 9/26.  Dispo: pending d/c back to SNF, PT/OT  Subjective:   C/o pain in the sacral region with dressing change o/w denies dyspnea, nausea, vomiting. Changed to a regular diet.   Objective:   BP 130/62 (BP Location: Right Leg)   Pulse 87   Temp 98.5 F (36.9 C) (Oral)   Resp 18   Ht 5\' 5"  (1.651 m)   Wt 39.5 kg   LMP  (LMP Unknown)   SpO2 98%   BMI 14.49 kg/m  No intake or output data in the 24 hours ending 11/25/20 1115 Weight change: -2 kg  Physical Exam: Gen:nad CVS:rrr Resp:normal wob TZG:YFVC, nt Ext: RUE swelling Neuro: awake, alert HD access: RUE AVF, dressing in place rt forearm (wound VAC off)  Imaging: No results found.  Labs: BMET Recent Labs  Lab 11/19/20 0125 11/21/20 0935 11/21/20 1330 11/23/20 1235  NA 135 137 138 134*  K 4.2 3.6 3.9 4.0  CL 97* 95* 97* 98  CO2 27 27 27 26   GLUCOSE 77 137* 122* 74  BUN 29* 34* 39* 36*  CREATININE 8.06* 7.69* 7.92* 7.08*  CALCIUM 10.1 10.6* 10.2 10.0  PHOS  --   --  2.6 2.8   CBC Recent Labs  Lab 11/22/20 0216 11/23/20 0556 11/24/20 0114 11/25/20 0434  WBC 9.3 7.0 7.0 4.8  HGB 12.4 8.3*  8.4* 9.0*  HCT 41.5 27.3* 28.5* 29.9*  MCV 106.7* 107.5* 108.4* 106.8*  PLT 263 279 332 288    Medications:     apixaban  2.5 mg Oral BID   vitamin C  500 mg Oral Q12H   calcium acetate  1,334 mg Oral TID WC   Chlorhexidine Gluconate Cloth  6 each Topical Q0600   cinacalcet  60 mg Oral Q T,Th,Sa-HD   clopidogrel  75 mg Oral Q breakfast   darbepoetin (ARANESP) injection - DIALYSIS  100 mcg Intravenous Q Thu-HD   dolutegravir  50 mg Oral q1600   feeding supplement  237 mL Oral BID BM   lamiVUDine  50 mg Oral QAC breakfast   melatonin  3 mg Oral Q24H   metoprolol tartrate  25 mg Oral BID   mirtazapine  7.5 mg Oral QHS   multivitamin  1 tablet Oral QHS   pantoprazole  40 mg Oral BID AC   senna  2 tablet Oral Q24H   sodium chloride flush  3 mL Intravenous Q12H   venlafaxine XR  150 mg Oral Q breakfast   zidovudine  100 mg Oral 3 times per day      Otelia Santee, MD 11/25/2020, 11:15 AM

## 2020-11-25 NOTE — Progress Notes (Signed)
Speech Language Pathology Treatment: Dysphagia  Patient Details Name: [name] MRN: [id number] DOB: Jul 10, 1950 Today's Date: 11/25/2020 Time: 9774-1423 SLP Time Calculation (min) (ACUTE ONLY): 17 min  Assessment / Plan / Recommendation Clinical Impression  Pt was seen for dysphagia treatment. Pt's NT reported that the pt has been requesting sandwiches and spaghetti, but that these are currently restricted from her diet. Pt reported to this SLP that she does not want many foods because they are too hard and that she has liked her current diet. Staff reports that the pt's cognition appears to be close to or at baseline. Pt's reports are inconsistent. Pt demonstrated prolonged mastication with peaches and ultimately spat them out stating, "I can't chew these". Pt tolerated graham crackers with mildly prolonged mastication, but oral clearance was adequate. Solids and thin liquids via straw were tolerated without overt s/sx of aspiration. Pt was changed to dysphagia 2 from regular following her acute change in mentation which impacted swallow function. However, these issues appear to be resolved and she's back to baseline. Pt's diet will be returned to regular textures to maximize pt's options, and with staff assisting with feeding. Further skilled SLP services are not clinically indicated at this time.   HPI HPI: Pt is a 70 y.o. female who presented with end-stage renal disease 11/11/20 for right A/V fistulagram. 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 On 9/19 patient was noted to have altered mental status with staring off, delayed responses, and concern for seizure activity with bilateral upper arm jerking with midline gaze and staring off. MRI 9/91 negative for acute changes. Pt given Narcan with initial improvement in mental status, but continued "staring off and delayed responses". 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 PMH: anemia, anxiety,  atrial flutter, C diff, depression, ESRD on HD, HIV, HTN, intracranial hemorrhage, pulmonary HTN, tachycardia, TIA, traumatic hematoma of R UE      SLP Plan  Continue with current plan of care      Recommendations for follow up therapy are one component of a multi-disciplinary discharge planning process, led by the attending physician.  Recommendations may be updated based on patient status, additional functional criteria and insurance authorization.    Recommendations  Diet recommendations: Regular;Thin liquid Liquids provided via: Cup;Straw Medication Administration: Crushed with puree (or whole with puree) Supervision: Patient able to self feed Postural Changes and/or Swallow Maneuvers: Seated upright 90 degrees                Oral Care Recommendations: Oral care BID Follow up Recommendations: None SLP Visit Diagnosis: Dysphagia, unspecified (R13.10) Plan: Continue with current plan of care       Hadia Minier I. Hardin Negus, [place], [place] Office number [phone] Pager [place]  11/25/2020, 9:45 AM

## 2020-11-25 NOTE — Progress Notes (Addendum)
  Progress Note    11/25/2020 7:36 AM 13 Days Post-Op  Subjective:  no complaints, more alert this morning    Vitals:   11/24/20 2319 11/25/20 0309  BP: (!) 113/52 132/64  Pulse: 68 73  Resp: 19 20  Temp: 97.6 F (36.4 C) 98.3 F (36.8 C)  SpO2: 96% 96%   Physical Exam: Cardiac:  regular Lungs:  non labored Incisions:  right arm incisions well appearing, staples intact, right forearm wound VAC to suction Extremities:  right upper extremity fistula with good thrill, improved edema of right upper arm. 2+ radial pulse, right hand warm Neurologic: alert  CBC    Component Value Date/Time   WBC 4.8 11/25/2020 0434   RBC 2.80 (L) 11/25/2020 0434   HGB 9.0 (L) 11/25/2020 0434   HCT 29.9 (L) 11/25/2020 0434   PLT 288 11/25/2020 0434   MCV 106.8 (H) 11/25/2020 0434   MCH 32.1 11/25/2020 0434   MCHC 30.1 11/25/2020 0434   RDW 16.6 (H) 11/25/2020 0434   LYMPHSABS 0.6 (L) 10/18/2019 0941   MONOABS 0.3 10/18/2019 0941   EOSABS 0.1 10/18/2019 0941   BASOSABS 0.0 10/18/2019 0941    BMET    Component Value Date/Time   NA 134 (L) 11/23/2020 1235   K 4.0 11/23/2020 1235   CL 98 11/23/2020 1235   CO2 26 11/23/2020 1235   GLUCOSE 74 11/23/2020 1235   BUN 36 (H) 11/23/2020 1235   CREATININE 7.08 (H) 11/23/2020 1235   CREATININE 5.66 (H) 12/22/2019 1117   CALCIUM 10.0 11/23/2020 1235   CALCIUM 9.0 03/12/2011 0527   GFRNONAA 6 (L) 11/23/2020 1235   GFRNONAA 5 (L) 02/12/2015 1000   GFRAA 9 (L) 10/22/2019 0105   GFRAA 5 (L) 02/12/2015 1000    INR    Component Value Date/Time   INR 1.14 12/28/2011 1403    No intake or output data in the 24 hours ending 11/25/20 0736   Assessment/Plan:  70 y.o. female is s/p  female is s/p conversion to upper arm fistula on the right and resection of distal fistula for the necrotic pseudoaneurysm 13 Days Post-Op   Wound VAC removed today. Wound with minimal depth so not reapplied. Will go to wet- to dry dressing changes daily. Well  appearing RUE well perfused with palpable radial pulse Good thrill in fistula. Working well at HD Next HD Tomorrow 9/27 Atrial flutter remains rate controlled on metoprolol Continue Eliquis 2.5 mg BID Pending SNF placement    DVT prophylaxis:  Apixaban   Karoline Caldwell, PA-C [facility] [phone] 11/25/2020 7:36 AM  I have independently interviewed and examined patient and agree with PA assessment and plan above.   Markcus Lazenby C. Donzetta Matters, MD [facility] [place] Office: [phone] Pager: [phone]

## 2020-11-25 NOTE — Consult Note (Signed)
[place] Nurse wound follow up Wound type:resection distal fistula, staple line with dehiscence along the staple line.  Measurement: nonintact wound : 3 cm x 2 cm x 0.1 cm staple line is 15 cm total.  All staples are intact and dry. Edges approximated Wound bed:10% fibrin  90% pale pink nongranulating Drainage (amount, consistency, odor) Scant serosanguinous  no odor.  Tender to touch Periwound:staples. Dry skin  Dressing procedure/placement/frequency:Remove NPWT due to no appreciable depth.  Cleanse right forearm wound with NS and pat dry. Apply NS moist 2x2 to open wound bed and cover entire staple line and wound with silicone foam dressing. Peel back foam and replace NS gauze daily and replace foam every three days.  Please date dressing.  Will not follow at this time.  Please re-consult if needed.  Domenic Moras MSN, RN, FNP-BC CWON Wound, Ostomy, Continence Nurse Pager [phone]

## 2020-11-25 NOTE — Progress Notes (Signed)
Nutrition Follow Up  DOCUMENTATION CODES:   Underweight, Severe malnutrition in context of chronic illness  INTERVENTION:   D/C Ensure and Magic Cup  Add snacks BID Add double protein portions with meals Renal MVI daily   NUTRITION DIAGNOSIS:   Severe Malnutrition related to chronic illness (HIV/ESRD) as evidenced by severe fat depletion, severe muscle depletion.  Ongoing  GOAL:   Patient will meet greater than or equal to 90% of their needs  Progressing  MONITOR:   PO intake, Weight trends, TF tolerance, Labs, I & O's, Skin  REASON FOR ASSESSMENT:   Malnutrition Screening Tool    ASSESSMENT:   Patient with PMH significant for chronic diarrhea, ESRD on HD, GERD, HIV, HTN, pulmonary HTN, and traumatic hematoma or R upper arm. Presents this admission with R upper extremity necrotic pseudoaneurysm with concern for infection.  9/12- R upper extremity fistulogram, angioplasty R subclavian  9/13- s/p ligation/resection of R arm radiocephalic AVF, creation of cephalic AVF  Patient reports appetite is progressing slowly. Ate a few bites of breakfast this am. No meal completions documented since 9/20. Does not like Ensure, Nepro, Boost, or Magic Cups. Willing to try snacks and double protein. If intake unable to progress, recommend Cortrak placement with EN given malnutrition and poor PO intake.   Admission weight: 46 kg Current weight: 39.5 kg  Medications: Vitamin C 500 mg BID, sensipar, aranesp, tivicay, remeron, senokot Labs: Na 134 (L)   Diet Order:   Diet Order             Diet regular Room service appropriate? Yes with Assist; Fluid consistency: Thin  Diet effective now                   EDUCATION NEEDS:   Not appropriate for education at this time  Skin:  Skin Assessment: Skin Integrity Issues: Skin Integrity Issues:: Other (Comment) Other: pressure injury- sacrum, R arm wound s/p surgery  Last BM:  9/25  Height:   Ht Readings from Last 1  Encounters:  11/12/20 5\' 5"  (1.651 m)    Weight:   Wt Readings from Last 1 Encounters:  11/25/20 39.5 kg    BMI:  Body mass index is 14.49 kg/m.  Estimated Nutritional Needs:   Kcal:  1400-1600 kcal  Protein:  75-90 grams  Fluid:  1000 ml + UOP  Pharrell Ledford MS, RD, LDN, CNSC Clinical Nutrition Pager listed in [place]

## 2020-11-25 NOTE — TOC Benefit Eligibility Note (Signed)
Patient Teacher, English as a foreign language completed.    The patient is currently admitted and upon discharge could be taking Xarelto 20 mg.  The current 30 day co-pay is, $0.00.   The patient is currently admitted and upon discharge could be taking Eliquis 5 mg.  The current 30 day co-pay is, $0.00.   The patient is insured through [employer] Part D/[place] Medicaid    Lyndel Safe, [place] Patient Advocate Specialist [facility] Antimicrobial Stewardship Team Direct Number: [phone]  Fax: [phone]

## 2020-11-26 LAB — RENAL FUNCTION PANEL
Albumin: 3.1 g/dL — ABNORMAL LOW (ref 3.5–5.0)
Anion gap: 15 (ref 5–15)
BUN: 44 mg/dL — ABNORMAL HIGH (ref 8–23)
CO2: 23 mmol/L (ref 22–32)
Calcium: 9.8 mg/dL (ref 8.9–10.3)
Chloride: 93 mmol/L — ABNORMAL LOW (ref 98–111)
Creatinine, Ser: 8.83 mg/dL — ABNORMAL HIGH (ref 0.44–1.00)
GFR, Estimated: 5 mL/min — ABNORMAL LOW (ref >60)
Glucose, Bld: 75 mg/dL (ref 70–99)
Phosphorus: 3.4 mg/dL (ref 2.5–4.6)
Potassium: 3.7 mmol/L (ref 3.5–5.1)
Sodium: 131 mmol/L — ABNORMAL LOW (ref 135–145)

## 2020-11-26 LAB — CBC
HCT: 28.7 % — ABNORMAL LOW (ref 36.0–46.0)
Hemoglobin: 8.7 g/dL — ABNORMAL LOW (ref 12.0–15.0)
MCH: 32.2 pg (ref 26.0–34.0)
MCHC: 30.3 g/dL (ref 30.0–36.0)
MCV: 106.3 fL — ABNORMAL HIGH (ref 80.0–100.0)
Platelets: 266 10*3/uL (ref 150–400)
RBC: 2.7 MIL/uL — ABNORMAL LOW (ref 3.87–5.11)
RDW: 17 % — ABNORMAL HIGH (ref 11.5–15.5)
WBC: 5.5 10*3/uL (ref 4.0–10.5)
nRBC: 0 % (ref 0.0–0.2)

## 2020-11-26 LAB — SARS CORONAVIRUS 2 (TAT 6-24 HRS): SARS Coronavirus 2: NEGATIVE

## 2020-11-26 NOTE — Progress Notes (Addendum)
[facility] [place]  Subjective  - Alert and oriented this am   Objective (!) 141/65 73 97.6 F (36.4 C) (Oral) 14 97%  Intake/Output Summary (Last 24 hours) at 11/26/2020 0720 Last data filed at 11/25/2020 2142 Gross per 24 hour  Intake 340 ml  Output --  Net 340 ml    Right UE wet to dry dressing changed  Wound bed filling in, decrease in edema Hand warm and well perfused Palpable radial pulse, thrill in UE AV fistula  Assessment/Planning: 70 y.o. 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 Apply NS moist 2x2 to open wound bed and cover entire staple line and wound with silicone foam dressing. Peel back foam and replace NS gauze daily and replace foam every three days.  Please date dressing.   Will plan f/u for staple removal pending Dr. Jamse Mead assessment. Patient has HD today.    I re consulted TOC for SNF placement SNF Covid test ordered 11/26/20  Eliquis has been restarted Atrial flutter remains rate controlled on metoprolol 25 mg BID  [facility] will sign off.   Medication Recommendations:  Eliquis 2.5 mg BID, metoprolol 25 mg BID Other recommendations (labs, testing, etc):  None Follow up as an outpatient:  Appointment with Dr Harrington Challenger on 10/28  Roxy Horseman 11/26/2020 7:20 AM --  Laboratory Lab Results: Recent Labs    11/25/20 0434 11/26/20 0330  WBC 4.8 5.5  HGB 9.0* 8.7*  HCT 29.9* 28.7*  PLT 288 266   BMET Recent Labs    11/23/20 1235  NA 134*  K 4.0  CL 98  CO2 26  GLUCOSE 74  BUN 36*  CREATININE 7.08*  CALCIUM 10.0    COAG Lab Results  Component Value Date   INR 1.14 12/28/2011   INR 1.10 12/13/2010   INR 1.10 01/26/2010   No results found for: PTT  I have independently  interviewed and examined patient on HD and agree with PA assessment and plan above.  Wound appears to be healing well.  Staples can be removed prior to discharge.  Pending SNF  Norina Cowper C. Donzetta Matters, MD [facility] [place] Office: [phone] Pager: [phone]

## 2020-11-26 NOTE — Progress Notes (Signed)
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  Patient receives dialysis on Tuesday Thursday Saturday basis. Usual dialysis centers dialysis [facility]  Assessment/ Plan:   ESRD:    Seen on  dialysis  11/26/2020 2K bath through RUE access 126/67 w/ goal net UF 1L tolerating  Next HD on Thur  Volume/ hypertension: . UF as tolerated Anemia of Chronic Kidney Disease: Continue to follow Aranesp initiated in hospital  Secondary Hyperparathyroidism/Hyperphosphatemia: Continue with binders; receives cinacalcet 60mg  and calcitriol 1.49mcg TIW. Phos 2.8 on 9/25.  Vascular access: Right upper extremity fistula with good bruit, slightly pulsatile s/p rt cimino resection + ligation prox/distal + collaterals.. Have been able to cannulate. Wound vac removed from right forearm on 9/26.  Dispo: pending d/c back to SNF, PT/OT  Subjective:   C/o pain in the sacral region with dressing change. Had some abdominal pain and  nausea which only resolved partially with a bowel movement.   Objective:   BP 118/68   Pulse 83   Temp 99.6 F (37.6 C) (Oral)   Resp (!) 22   Ht 5\' 5"  (1.651 m)   Wt 38.7 kg   LMP  (LMP Unknown)   SpO2 99%   BMI 14.20 kg/m   Intake/Output Summary (Last 24 hours) at 11/26/2020 1200 Last data filed at 11/25/2020 2142 Gross per 24 hour  Intake 340 ml  Output --  Net 340 ml   Weight change: 3.7 kg  Physical Exam: Gen:nad CVS:rrr Resp:normal wob NLZ:JQBH, nt Ext: RUE swelling Neuro: awake, alert HD access: RUE AVF, dressing in place rt forearm (wound VAC off)  Imaging: No results found.  Labs: BMET Recent Labs  Lab 11/21/20 0935 11/21/20 1330 11/23/20 1235 11/26/20 0732  NA 137 138 134* 131*  K 3.6 3.9 4.0 3.7  CL 95* 97* 98 93*  CO2 27 27 26 23   GLUCOSE 137* 122* 74 75  BUN 34* 39* 36* 44*  CREATININE 7.69* 7.92* 7.08*  8.83*  CALCIUM 10.6* 10.2 10.0 9.8  PHOS  --  2.6 2.8 3.4   CBC Recent Labs  Lab 11/23/20 0556 11/24/20 0114 11/25/20 0434 11/26/20 0330  WBC 7.0 7.0 4.8 5.5  HGB 8.3* 8.4* 9.0* 8.7*  HCT 27.3* 28.5* 29.9* 28.7*  MCV 107.5* 108.4* 106.8* 106.3*  PLT 279 332 288 266    Medications:     apixaban  2.5 mg Oral BID   vitamin C  500 mg Oral Q12H   calcium acetate  1,334 mg Oral TID WC   Chlorhexidine Gluconate Cloth  6 each Topical Q0600   cinacalcet  60 mg Oral Q T,Th,Sa-HD   clopidogrel  75 mg Oral Q breakfast   darbepoetin (ARANESP) injection - DIALYSIS  100 mcg Intravenous Q Thu-HD   dolutegravir  50 mg Oral q1600   lamiVUDine  50 mg Oral QAC breakfast   melatonin  3 mg Oral Q24H   metoprolol tartrate  25 mg Oral BID   mirtazapine  7.5 mg Oral QHS   multivitamin  1 tablet Oral QHS   pantoprazole  40 mg Oral BID AC   senna  2 tablet Oral Q24H   sodium chloride flush  3 mL Intravenous Q12H   venlafaxine XR  150 mg Oral Q breakfast   zidovudine  100 mg Oral 3 times per day      Otelia Santee, MD 11/26/2020, 12:00 PM

## 2020-11-26 NOTE — TOC Progression Note (Signed)
Transition of Care [facility] - Progression Note    Patient Details  Name: [name] MRN: [id number] Date of Birth: 1950-06-17  Transition of Care [facility]) CM/SW [place], [place] Phone Number: 11/26/2020, 2:54 PM  Clinical Narrative:     [name] confirmed patient can return- CSW advised anticipated d/c tomorrow, patient will not need wound vac  RN updated and covid test requested.  Thurmond Butts, MSW, LCSW Clinical Social Worker    Expected Discharge Plan: Skilled Nursing Facility Barriers to Discharge: Insurance Authorization  Expected Discharge Plan and Services Expected Discharge Plan: [place] In-house Referral: Clinical Social Work     Living arrangements for the past 2 months: [place]                                       Social Determinants of Health (SDOH) Interventions    Readmission Risk Interventions Readmission Risk Prevention Plan 10/22/2019  Transportation Screening Complete  PCP or Specialist Appt within 3-5 Days Not Complete  Not Complete comments SNF MD to follow  [place] or [place] Not Complete  HRI or Home Care Consult comments Pt resides in a SNF  Social Work Consult for [place] Planning/Counseling Complete  Palliative Care Screening Not Applicable  Medication Review Press photographer) Complete  Some recent data might be hidden

## 2020-11-26 NOTE — Progress Notes (Signed)
Pt returned from dialysis, VSS, call light within reach, will continue to monitor.  Chrisandra Carota, RN 11/26/2020 1:13 PM

## 2020-11-26 NOTE — Progress Notes (Signed)
Occupational Therapy Treatment Patient Details Name: [name] MRN: [id number] DOB: 09-12-1950 Today's Date: 11/26/2020   History of present illness Pt is a 71 y.o. female who presented with end-stage renal disease 11/11/20 for right A/V fistulagram. 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 Pt with decline during admission, MRI negative for acute intracranial process, awaiting EEG results. PMH: anemia, anxiety, atrial flutter, C diff, depression, ESRD on HD, HIV, HTN, intracranial hemorrhage, pulmonary HTN, tachycardia, TIA, traumatic hematoma of R UE   OT comments  Pt progressing towards OT goals and remains motivated to participate despite nausea this AM. Improving cognition noted during session with appropriate responses throughout session. Pt overall Mod A for mobility using RW in room with assist needed to maintain balance when turning and maneuver RW with slow pace. Pt overall Min A for UB ADLs and Max A for LB ADLs due to decreased strength and endurance. Pt with ROM limitations in R UE, especially hand and wrist. Provided squeeze ball to encourage ability to make fist, as well as beginning gentle ROM for R UE joints. Plan to further guide pt in exercises for R UE in next session. DC recs remain appropriate.    Recommendations for follow up therapy are one component of a multi-disciplinary discharge planning process, led by the attending physician.  Recommendations may be updated based on patient status, additional functional criteria and insurance authorization.    Follow Up Recommendations  SNF;Supervision/Assistance - 24 hour    Equipment Recommendations  None recommended by OT    Recommendations for Other Services      Precautions / Restrictions Precautions Precautions: Fall Precaution Comments: R UE staples Restrictions Weight Bearing Restrictions: No       Mobility Bed Mobility Overal bed mobility: Needs Assistance Bed Mobility: Supine  to Sit;Sit to Supine     Supine to sit: Mod assist Sit to supine: Min assist   General bed mobility comments: Mod A to bring R LE off of bed fully and lift trunk with L UE. Min A to get B LE back into bed    Transfers Overall transfer level: Needs assistance Equipment used: Rolling walker (2 wheeled) Transfers: Sit to/from Stand Sit to Stand: Min assist         General transfer comment: Min A for power up from bedside, does need cues for hand placement    Balance Overall balance assessment: History of Falls;Needs assistance Sitting-balance support: Feet supported Sitting balance-Leahy Scale: Fair     Standing balance support: Bilateral upper extremity supported;During functional activity Standing balance-Leahy Scale: Poor Standing balance comment: reliant on UE support in standing + external support for dynamic tasks                           ADL either performed or assessed with clinical judgement   ADL Overall ADL's : Needs assistance/impaired                 Upper Body Dressing : Minimal assistance;Sitting Upper Body Dressing Details (indicate cue type and reason): to don gown around back EOB Lower Body Dressing: Maximal assistance;Sitting/lateral leans;Sit to/from stand Lower Body Dressing Details (indicate cue type and reason): to don socks. pt reports typically able to do this but feels too weak to complete today. Attempted to elicit how pt typically did this task prior to admission but fatigued by end of session  Functional mobility during ADLs: Moderate assistance;Rolling walker General ADL Comments: Pt agreeable to attempt OOB activities, able to mobilize with RW at Min-Mod A with asist to advance RW and slow pacing noted. Pt able to ambulate to sink and bathroom door. Improving cognition and appropriate responses. Noted impairments in R UE ROM - educated on exercises to begin for ROM and provided squeeze ball.     Vision    Vision Assessment?: No apparent visual deficits   Perception     Praxis      Cognition Arousal/Alertness: Awake/alert Behavior During Therapy: Flat affect Overall Cognitive Status: Impaired/Different from baseline Area of Impairment: Memory;Following commands;Safety/judgement;Awareness;Problem solving                     Memory: Decreased short-term memory Following Commands: Follows one step commands with increased time;Follows multi-step commands with increased time Safety/Judgement: Decreased awareness of safety;Decreased awareness of deficits Awareness: Emergent Problem Solving: Slow processing;Decreased initiation;Difficulty sequencing;Requires verbal cues;Requires tactile cues General Comments: Pt with improving cognition, no longer repeating phrases, appropriate responses and willing to try all tasks. Pt able to sequence with slower processing and increased time for motor planning. improving awareness (fluid restriction, which ginger ale she wanted, need for bathroom, etc)        Exercises     Shoulder Instructions       General Comments      Pertinent Vitals/ Pain       Pain Assessment: Faces Faces Pain Scale: Hurts little more Pain Location: R UE Pain Descriptors / Indicators: Sore Pain Intervention(s): Monitored during session;Limited activity within patient's tolerance  Home Living                                          Prior Functioning/Environment              Frequency  Min 2X/week        Progress Toward Goals  OT Goals(current goals can now be found in the care plan section)  Progress towards OT goals: Progressing toward goals  Acute Rehab OT Goals Patient Stated Goal: agreeable to walk, get stronger OT Goal Formulation: With patient Time For Goal Achievement: 12/01/20 Potential to Achieve Goals: Fair ADL Goals Pt Will Perform Grooming: with min guard assist;sitting Pt Will Perform Upper Body Bathing: with  min assist;sitting Pt Will Perform Lower Body Bathing: with mod assist;sitting/lateral leans Additional ADL Goal #1: Pt to complete bed mobility at Mod A in prep for ADL transfers  Plan Discharge plan remains appropriate    Co-evaluation                 AM-PAC OT "6 Clicks" Daily Activity     Outcome Measure   Help from another person eating meals?: A Little Help from another person taking care of personal grooming?: A Little Help from another person toileting, which includes using toliet, bedpan, or urinal?: Total Help from another person bathing (including washing, rinsing, drying)?: Total Help from another person to put on and taking off regular upper body clothing?: A Little Help from another person to put on and taking off regular lower body clothing?: A Lot 6 Click Score: 13    End of Session Equipment Utilized During Treatment: Gait belt;Rolling walker  OT Visit Diagnosis: Unsteadiness on feet (R26.81);Other abnormalities of gait and mobility (R26.89);Muscle weakness (generalized) (M62.81)   Activity Tolerance Patient tolerated  treatment well   Patient Left in bed;with call bell/phone within reach;with bed alarm set   Nurse Communication Mobility status        Time: 9688-6484 OT Time Calculation (min): 27 min  Charges: OT General Charges $OT Visit: 1 Visit OT Treatments $Self Care/Home Management : 8-22 mins $Therapeutic Activity: 8-22 mins  Malachy Chamber, OTR/L Acute Rehab Services Office: [phone]   Layla Maw 11/26/2020, 8:33 AM

## 2020-11-26 NOTE — TOC Progression Note (Signed)
Transition of Care [facility]) - Progression Note    Patient Details  Name: ALEXIZ COTHRAN MRN: [id number] Date of Birth: 1950-12-19  Transition of Care [facility]) CM/SW [place], [place] Phone Number: 11/26/2020, 2:58 PM  Clinical Narrative:     [facility]- will review and inform CSW if they can make an offer.- CSW waiting on response.  Thurmond Butts, MSW, LCSW Clinical Social Worker    Expected Discharge Plan: Skilled Nursing Facility Barriers to Discharge: Insurance Authorization  Expected Discharge Plan and Services Expected Discharge Plan: [place] In-house Referral: Clinical Social Work     Living arrangements for the past 2 months: [place]                                       Social Determinants of Health (SDOH) Interventions    Readmission Risk Interventions Readmission Risk Prevention Plan 10/22/2019  Transportation Screening Complete  PCP or Specialist Appt within 3-5 Days Not Complete  Not Complete comments SNF MD to follow  [place] or [place] Not Complete  HRI or Home Care Consult comments Pt resides in a SNF  Social Work Consult for [place] Planning/Counseling Complete  Palliative Care Screening Not Applicable  Medication Review Press photographer) Complete  Some recent data might be hidden

## 2020-11-26 NOTE — Progress Notes (Signed)
PT Cancellation Note  Patient Details Name: [name] MRN: [id number] DOB: 01/26/1951   Cancelled Treatment:    Reason Eval/Treat Not Completed: Patient at procedure or test/unavailable Pt off floor at HD. Will follow up as time allows.   Marguarite Arbour A Tiye Huwe 11/26/2020, 9:46 AM Marisa Severin, PT, DPT Acute Rehabilitation Services Pager [phone] Office [phone]

## 2020-11-27 LAB — CBC
HCT: 31.2 % — ABNORMAL LOW (ref 36.0–46.0)
Hemoglobin: 9.5 g/dL — ABNORMAL LOW (ref 12.0–15.0)
MCH: 33.5 pg (ref 26.0–34.0)
MCHC: 30.4 g/dL (ref 30.0–36.0)
MCV: 109.9 fL — ABNORMAL HIGH (ref 80.0–100.0)
Platelets: 260 10*3/uL (ref 150–400)
RBC: 2.84 MIL/uL — ABNORMAL LOW (ref 3.87–5.11)
RDW: 17.4 % — ABNORMAL HIGH (ref 11.5–15.5)
WBC: 4.7 10*3/uL (ref 4.0–10.5)
nRBC: 0.6 % — ABNORMAL HIGH (ref 0.0–0.2)

## 2020-11-27 MED ORDER — METHADONE HCL 5 MG PO TABS: 2.5000 mg | ORAL_TABLET | Freq: Every day | ORAL | 0 refills | Status: DC

## 2020-11-27 MED ORDER — METOPROLOL TARTRATE 25 MG PO TABS: 25.0000 mg | ORAL_TABLET | Freq: Two times a day (BID) | ORAL | 0 refills | Status: AC

## 2020-11-27 MED ORDER — APIXABAN 2.5 MG PO TABS: 2.5000 mg | ORAL_TABLET | Freq: Two times a day (BID) | ORAL | 0 refills | Status: AC

## 2020-11-27 MED ORDER — OXYCODONE HCL 10 MG PO TABS: 10.0000 mg | ORAL_TABLET | Freq: Two times a day (BID) | ORAL | 0 refills | Status: DC | PRN

## 2020-11-27 MED ORDER — FENTANYL 25 MCG/HR TD PT72: 1.0000 | MEDICATED_PATCH | TRANSDERMAL | 0 refills | Status: DC

## 2020-11-27 NOTE — Progress Notes (Addendum)
Communicated with CSW this am who states plan is for pt to d/c today to [facility]. Will f/u with pt's out-pt HD clinic once d/c orders completed.  Melven Sartorius Renal Navigator [phone]  Addendum at 12:30 pm: Spoke to [place] at [facility] to advise her of pt's d/c today and pt will resume out-pt schedule tomorrow. Requested document faxed for continuation of care.

## 2020-11-27 NOTE — TOC Transition Note (Signed)
Transition of Care [facility]) - CM/SW Discharge Note   Patient Details  Name: [name] MRN: [id number] Date of Birth: Jul 16, 1950  Transition of [facility]) CM/SW Contact:  Vinie Sill, LCSW Phone Number: 11/27/2020, 2:12 PM   Clinical Narrative:     Patient will Discharge to: [facility] Discharge Date: 11/27/2020 Family Notified: Kieth Brightly, friend Transport By: Corey Harold CSW informed [place]-patient has wheel chair here for pick up.  Per MD patient is ready for discharge. RN, patient, and facility notified of discharge. Discharge Summary sent to facility. RN given number for [phone]. Ambulance transport requested for patient.   Clinical Social Worker signing off.  Thurmond Butts, MSW, LCSW Clinical Social Worker     Final next level of care: Skilled Nursing Facility Barriers to Discharge: Barriers Resolved   Patient Goals and CMS Choice        Discharge Placement              Patient [facility] bed at: [facility] Patient to be transferred to facility by: [place] Name of family member notified: Penny,friend Patient and family notified of of transfer: 11/27/20  Discharge Plan and Services In-house Referral: Clinical Social Work                                   Social Determinants of Health (SDOH) Interventions     Readmission Risk Interventions Readmission Risk Prevention Plan 10/22/2019  Transportation Screening Complete  PCP or Specialist Appt within 3-5 Days Not Complete  Not Complete comments SNF MD to follow  [place] or [place] Not Complete  HRI or Home Care Consult comments Pt resides in a SNF  Social Work Consult for [place] Planning/Counseling Complete  Palliative Care Screening Not Applicable  Medication Review Press photographer) Complete  Some recent data might be hidden

## 2020-11-27 NOTE — Discharge Summary (Signed)
[facility] Discharge Summary   Patient ID:  [name] MRN: [id number] DOB/AGE: [birth year] 70 y.o.  Admit date: 11/11/2020 Discharge date: 11/27/2020 Date of Surgery: 11/12/2020 Surgeon: Surgeon(s): Waynetta Sandy, MD  Admission Diagnosis: ESRD (end stage renal disease) [facility]) [N18.6]  Discharge Diagnoses:  ESRD (end stage renal disease) ([place]) [N18.6]  Secondary Diagnoses: Past Medical History:  Diagnosis Date   Anemia    Anxiety    Atrial flutter ([place]) 02/22/2015   C. difficile colitis 10/10/11   Chronic diarrhea    Chronic pain    Depression    ESRD on hemodialysis ([place])    Dialysis [name]   GERD (gastroesophageal reflux disease)    HIV (human immunodeficiency virus infection) ([place])    Hypertension    Insomnia    Intracranial hemorrhage (H[facility])    Muscle weakness (generalized)    Pulmonary HTN ([place])    Tachycardia    TIA (transient ischemic attack)    [facility] do not have this dx   Traumatic hematoma of right upper arm 02/22/2015    Procedure(s): CREATION OF RIGHT ARM ARTERIOVENOUS FISTULA LIGATION OF ARTERIOVENOUS  FISTULA WITH EXCISION OF NECROTIC TISSUE  Discharged Condition: stable  HPI: 70 y/o female with history of ESRD.   She has been treated with dialysis with antibiotics for concern for infection for right arm pseudoaneurysm.  She denies any fevers or chills.  She does have swelling of the upper arm.  She has a known right innominate stent.  She also has failed left upper extremity access.      Hospital Course:  [name] is a 70 y.o. female is S/P Right Procedure(s): CREATION OF RIGHT ARM ARTERIOVENOUS FISTULA LIGATION OF ARTERIOVENOUS  FISTULA WITH EXCISION OF NECROTIC TISSUE  Post op she we used wet to dry dressings on the fore arm wound.  Once the wound bed appeared viable we used a wound vac.  The wound depth and tunneling proximal was healed enough to transition back to wet to  dry.    Wet to dry dressing changes: Recommendations from wound care RN Cleanse right forearm wound with NS and pat dry. Apply NS moist 2x2 to open wound bed and cover entire staple line and wound with silicone foam dressing. Peel back foam and replace NS gauze daily and replace foam every three days.  Please date dressing.   She had an episode of Atrial Flutter - Isolated episode of atrial flutter in 2016 with no recurrence. Therefore, was not on anticoagulation. On 9/21, had episode of atrial flutter - CHADS-VASc score 5, started on heparin gtt.  Would plan to transition to Eliquis 2.5 mg BID once OK from surgical standpoint (low dose Eliquis given weight/renal function, d/w pharmacy: OK for Eliquis, no interactions with HIV meds) -Rates appear controlled on metoprolol 25 mg twice daily -Given rates controlled, will plan outpatient follow-up for atrial flutter.  If remains in aflutter as recovers from her surgery, will plan outpatient cardioversion once completed 3 weeks of anticoagulation  Questionable Chest Pain - Patient had questionable episode of chest pain 9/19. However, she has had mental status changes so difficult to get reliable history.  - EKG showed showed normal sinus rhythm, rate 86 bpm, with T wave inversions/mild ST depression in anterolateral leads. She has had transient ST/T changes in these leads before.. - Troponin 44>44, not consistent with ACS - Echo 9/20 with normal biventricular function, no regional wall motion abnormalities, mild left atrial dilatation,  no significant valvular disease. -Recommend outpatient follow-up.  Can consider ischemia evaluation as outpatient if having further chest pain once encephalopathy resolves   Hypertension - BP very labile  -Continue metoprolol 25 mg twice daily  Neurology was consulted for mental status changes.  No intervention was needed.  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  ESRD HD TTS right UE AV fistula working while hospitalized.     Stable for discharge  Consults:  Treatment Team:  Gean Quint, MD  Significant Diagnostic Studies: CBC Lab Results  Component Value Date   WBC 4.7 11/27/2020   HGB 9.5 (L) 11/27/2020   HCT 31.2 (L) 11/27/2020   MCV 109.9 (H) 11/27/2020   PLT 260 11/27/2020    BMET    Component Value Date/Time   NA 131 (L) 11/26/2020 0732   K 3.7 11/26/2020 0732   CL 93 (L) 11/26/2020 0732   CO2 23 11/26/2020 0732   GLUCOSE 75 11/26/2020 0732   BUN 44 (H) 11/26/2020 0732   CREATININE 8.83 (H) 11/26/2020 0732   CREATININE 5.66 (H) 12/22/2019 1117   CALCIUM 9.8 11/26/2020 0732   CALCIUM 9.0 03/12/2011 0527   GFRNONAA 5 (L) 11/26/2020 0732   GFRNONAA 5 (L) 02/12/2015 1000   GFRAA 9 (L) 10/22/2019 0105   GFRAA 5 (L) 02/12/2015 1000   COAG Lab Results  Component Value Date   INR 1.14 12/28/2011   INR 1.10 12/13/2010   INR 1.10 01/26/2010     Disposition:  Discharge to :Skilled nursing facility  Allergies as of 11/27/2020       Reactions   Lactose Intolerance (gi) Other (See Comments)   On MAR   Latex Rash, Itching   Penicillins Rash, Swelling   Has patient had a PCN reaction causing immediate rash, facial/tongue/throat swelling, SOB or lightheadedness with hypotension: No Has patient had a PCN reaction causing severe rash involving mucus membranes or skin necrosis: No Has patient had a PCN reaction that required hospitalization No Has patient had a PCN reaction occurring within the last 10 years: No If all of the above answers are "NO", then may proceed with Cephalosporin use.        Medication List     TAKE these medications    apixaban 2.5 MG Tabs tablet Commonly known as: ELIQUIS Take 1 tablet (2.5 mg total) by mouth 2 (two) times daily.   bacitracin 500 UNIT/GM ointment Apply 1 application topically every 8 (eight) hours as needed for  wound care.   Baclofen 5 MG Tabs Take 5 mg by mouth daily as needed (muscle spasms).   Biotin 5000 MCG Tabs Take 5,000 mcg by mouth daily with breakfast. (0800)   calcium acetate 667 MG capsule Commonly known as: PHOSLO Take 667-1,334 mg by mouth See admin instructions. Take 2 capsules (1334 mg) by mouth 3 times daily at 0800, 1200 & 1700.  Take 1 capsule (667 mg) by mouth twice daily at 1000 & 1400.   clopidogrel 75 MG tablet Commonly known as: PLAVIX Take 1 tablet (75 mg total) by mouth daily with breakfast. What changed: additional instructions   cyanocobalamin 1000 MCG/ML injection Commonly known as: (VITAMIN B-12) Inject 1,000 mcg into the muscle every 30 (thirty) days. On the 28th of every month   fentaNYL 25 MCG/HR Commonly known as: [place] 1 patch onto the skin every 3 (three) days.   lamivudine 100 MG  tablet Commonly known as: EPIVIR Take 50 mg by mouth daily. (0800)   Lidocaine HCl 4 % Crea Apply 1 application topically every 12 (twelve) hours as needed (pain).   lidocaine-prilocaine cream Commonly known as: EMLA Apply 1 application topically See admin instructions. (0900) Apply to fistula site one hour before dialysis on Tuesday, Thursday, and Saturday.   melatonin 3 MG Tabs tablet Take 3 mg by mouth at bedtime. (2000)   methadone 5 MG tablet Commonly known as: DOLOPHINE Take 0.5 tablets (2.5 mg total) by mouth daily at 8 pm. (2000)   metoprolol tartrate 25 MG tablet Commonly known as: LOPRESSOR Take 1 tablet (25 mg total) by mouth 2 (two) times daily.   Minerin Lotn Apply 1 application topically every 12 (twelve) hours as needed (dry skin). Apply to feet and ankles   mirtazapine 7.5 MG tablet Commonly known as: REMERON Take 7.5 mg by mouth at bedtime. (2100)   multivitamin with minerals Tabs tablet Take 1 tablet by mouth at bedtime. (2000)   nitroGLYCERIN 0.4 MG SL tablet Commonly known as: NITROSTAT Place 0.4 mg under the tongue every  5 (five) minutes x 3 doses as needed for chest pain.   ondansetron 4 MG tablet Commonly known as: ZOFRAN Take 4 mg by mouth every 8 (eight) hours as needed for nausea or vomiting.   Oxycodone HCl 10 MG Tabs Take 1 tablet (10 mg total) by mouth every 12 (twelve) hours as needed (severe pain).   pantoprazole 40 MG tablet Commonly known as: PROTONIX Take 1 tablet (40 mg total) by mouth 2 (two) times daily before a meal. What changed: when to take this   promethazine 12.5 MG tablet Commonly known as: PHENERGAN Take 12.5 mg by mouth See admin instructions. Take 1 tablet (12.5 mg) by mouth in the morning every Tuesday, Thursday and Saturday for nausea/vomiting and every 6 hours as needed for n/v   senna 8.6 MG tablet Commonly known as: SENOKOT Take 2 tablets by mouth every evening. (2100)   Tivicay 50 MG tablet Generic drug: dolutegravir Take 50 mg by mouth daily at 4 PM.   venlafaxine XR 150 MG 24 hr capsule Commonly known as: EFFEXOR-XR Take 150 mg by mouth daily with breakfast. (0900)   vitamin C 500 MG tablet Commonly known as: ASCORBIC ACID Take 500 mg by mouth 2 (two) times daily. (0900 & 2100)   zidovudine 100 MG capsule Commonly known as: RETROVIR Take 100 mg by mouth 3 (three) times daily. (0900, 1300, & 2100)       Verbal and written Discharge instructions given to the patient. Wound care per Discharge AVS  Follow-up Information     Fay Records, MD Follow up on 12/27/2020.   Specialty: Cardiology Why: Please arrive 15 minutes early for your 1:30pm post-hospital cardiology appointment Contact information: [place] [address] [phone]         Waynetta Sandy, MD Follow up in 3 week(s).   Specialties: Vascular Surgery, Cardiology Why: Office will call you to arrange your appt (sent) Contact information: [place] [address] [phone]                 Signed: Roxy Horseman 11/27/2020, 12:14 PM

## 2020-11-27 NOTE — Progress Notes (Signed)
Pt d/c'd from 4E to SNF, [facility], via [place]. Recent VSS. Telemetry and IV removed.   Raelyn Number, RN

## 2020-11-27 NOTE — Progress Notes (Addendum)
[facility] [place]  Subjective  - Rested well   Objective (!) 117/59 73 97.8 F (36.6 C) (Oral) 14 100%  Intake/Output Summary (Last 24 hours) at 11/27/2020 0753 Last data filed at 11/26/2020 2104 Gross per 24 hour  Intake 220 ml  Output 722 ml  Net -502 ml    Right UE minimal edema, dressing changed by RN Palpable radial pulse, hand warm Staples intact above and below the wound.  Wet to dry dressing daily  Assessment/Planning: 70 y.o. female is s/p  female is s/p conversion to upper arm fistula on the right and resection of distal fistula for the necrotic pseudoaneurysm 15 Days Post-Op   [facility] will sign off.   Medication Recommendations:  Eliquis 2.5 mg BID, metoprolol 25 mg BID Other recommendations (labs, testing, etc):  None Follow up as an outpatient:  Appointment with Dr Harrington Challenger on 10/28  SNF Covid test ordered 11/26/20 Negative   Eliquis has been restarted Atrial flutter remains rate controlled on metoprolol 25 mg BID  Will plan f/u for staples removed prior today   Discharge to SNF today.  Roxy Horseman 11/27/2020 7:53 AM --  Laboratory Lab Results: Recent Labs    11/26/20 0330 11/27/20 0135  WBC 5.5 4.7  HGB 8.7* 9.5*  HCT 28.7* 31.2*  PLT 266 260   BMET Recent Labs    11/26/20 0732  NA 131*  K 3.7  CL 93*  CO2 23  GLUCOSE 75  BUN 44*  CREATININE 8.83*  CALCIUM 9.8    COAG Lab Results  Component Value Date   INR 1.14 12/28/2011   INR 1.10 12/13/2010   INR 1.10 01/26/2010   No results found for: PTT   I have independently interviewed and examined patient and agree with PA assessment and plan above.   Carrick Rijos C. Donzetta Matters, MD [facility] [place] Office: [phone] Pager: [phone]

## 2020-11-27 NOTE — Progress Notes (Signed)
  [facility] Progress Note   70 year old lady status post conversion to upper arm fistula on the right with resection of distal fistula for necrotic pseudoaneurysm.  Patient receives dialysis on Tuesday Thursday Saturday basis. Usual dialysis centers dialysis [facility]  Assessment/ Plan:   ESRD:    HD   11/26/2020 722 net neg  Next HD on Thur if she Is still here.   Volume/ hypertension: . UF as tolerated Anemia of Chronic Kidney Disease: Continue to follow Aranesp initiated in hospital  Secondary Hyperparathyroidism/Hyperphosphatemia: Continue with binders; receives cinacalcet 60mg  and calcitriol 1.51mcg TIW. Phos 2.8 on 9/25.  Vascular access: Right upper extremity fistula with good bruit, slightly pulsatile s/p rt cimino resection + ligation prox/distal + collaterals.. Have been able to cannulate. Wound vac removed from right forearm on 9/26.  Dispo: pending d/c back to SNF, PT/OT  Subjective:   C/o pain in the sacral region with dressing change but more comfortable today.   Objective:   BP (!) 133/47 (BP Location: Right Leg)   Pulse 81   Temp 98.1 F (36.7 C) (Oral)   Resp 17   Ht 5\' 5"  (1.651 m)   Wt 41.5 kg   LMP  (LMP Unknown)   SpO2 100%   BMI 15.22 kg/m   Intake/Output Summary (Last 24 hours) at 11/27/2020 1116 Last data filed at 11/26/2020 2104 Gross per 24 hour  Intake 220 ml  Output 722 ml  Net -502 ml   Weight change: -4.5 kg  Physical Exam: Gen:nad CVS:rrr Resp:normal wob HYQ:MVHQ, nt Ext: RUE swelling Neuro: awake, alert HD access: RUE AVF, dressing in place rt forearm (wound VAC off)  Imaging: No results found.  Labs: BMET Recent Labs  Lab 11/21/20 0935 11/21/20 1330 11/23/20 1235 11/26/20 0732  NA 137 138 134* 131*  K 3.6 3.9 4.0 3.7  CL 95* 97* 98 93*  CO2 27 27 26 23   GLUCOSE 137* 122* 74 75  BUN 34* 39* 36* 44*  CREATININE 7.69* 7.92* 7.08* 8.83*  CALCIUM 10.6* 10.2 10.0 9.8  PHOS  --  2.6 2.8 3.4    CBC Recent Labs  Lab 11/24/20 0114 11/25/20 0434 11/26/20 0330 11/27/20 0135  WBC 7.0 4.8 5.5 4.7  HGB 8.4* 9.0* 8.7* 9.5*  HCT 28.5* 29.9* 28.7* 31.2*  MCV 108.4* 106.8* 106.3* 109.9*  PLT 332 288 266 260    Medications:     apixaban  2.5 mg Oral BID   vitamin C  500 mg Oral Q12H   calcium acetate  1,334 mg Oral TID WC   Chlorhexidine Gluconate Cloth  6 each Topical Q0600   cinacalcet  60 mg Oral Q T,Th,Sa-HD   clopidogrel  75 mg Oral Q breakfast   darbepoetin (ARANESP) injection - DIALYSIS  100 mcg Intravenous Q Thu-HD   dolutegravir  50 mg Oral q1600   lamiVUDine  50 mg Oral QAC breakfast   melatonin  3 mg Oral Q24H   metoprolol tartrate  25 mg Oral BID   mirtazapine  7.5 mg Oral QHS   multivitamin  1 tablet Oral QHS   pantoprazole  40 mg Oral BID AC   senna  2 tablet Oral Q24H   sodium chloride flush  3 mL Intravenous Q12H   venlafaxine XR  150 mg Oral Q breakfast   zidovudine  100 mg Oral 3 times per day      Otelia Santee, MD 11/27/2020, 11:16 AM

## 2020-11-27 NOTE — Care Management Important Message (Signed)
Important Message  Patient Details  Name: [name] MRN: [id number] Date of Birth: Nov 04, 1950   Medicare Important Message Given:  Yes     [name] 11/27/2020, 10:24 AM

## 2020-12-14 NOTE — Progress Notes (Signed)
Referring Provider: Hilbert Corrigan* Primary Care Physician:  Hilbert Corrigan, MD Primary GI Physician: Dr. Abbey Chatters  Chief Complaint  Patient presents with   Nausea    And vomiting occ    HPI:   [name] is a 70 y.o. female with history of ESRD on hemodialysis, HIV, hypertension, chronic pain syndrome residing in a nursing home, atrial flutter during hospitalization in September this year, now on Eliquis. Also with history of severe colitis in August 2021 treated with cipro and flagyl. Follow-up colonoscopy 01/02/2020 with nonbleeding internal hemorrhoids, diverticulosis, status post biopsies of the entire colon (benign). Due for repeat in 2026.  Underwent EGD/EUS May 2022 to evaluate biliary and pancreatic duct dilation for which she was essentially asymptomatic with normal LFTs.  EGD/EUS findings described under surgical history, but overall no significant findings to explain biliary/pancreatic duct dilation and suspected this was secondary to chronic narcotics.  She was noted to have hyperechoic strands within the pancreatic parenchyma.  Recommended repeat MRI/MRCP in 6-12 months as long as clinical status is stable.  If new onset of other symptoms, should update imaging and obtain CA 19-9.  Also recommended consider fecal elastase testing to evaluate for EPI.  Last seen in our office 08/05/2020.  She continued with intermittent nausea/vomiting and intermittent epigastric pain that seems to be associated with dialysis, but can occur on nondialysis days and may occur before or after meals.  No identified triggers.  Pain only lasting a few minutes.  Felt symptoms may have improved somewhat since starting omeprazole in May.  No significant GERD symptoms.  No alarm symptoms.  Bowels moving well with formed stools.  Plan included fecal elastase, stop omeprazole and start Protonix 40 mg twice daily, continue Phenergan as needed, recall for MRI/MRCP in 6 months.  Fecal elastase  results were not received.  Today:  N/V on days of dialysis (Tuesday, Thursday, Saturday).  States she feels nauseated the night of dialysis and the next day.  Also states nausea can occur on days she is not having dialysis and can occur before or after meals.  She uses Zofran 4 mg as needed, but feels it is not strong enough.  She does have intermittent epigastric pain that occurred after meals.  She reports bacon and bologna are known triggers.  Also with reflux symptoms a few days a week as well as intermittent sensation of food getting hung in her esophagus.  Prefers to hold off on any sort of procedure for now.  She has a lot of complaints about the food at [facility].  She states she does not like it and thus does not eat very well.  With food she likes, she is tends to do fairly well.   Note 13 pound weight loss over the last 4 months. Per patient, she lost most of her weight during prolonged hospital stay in September at [facility].  She had necrotic pseudoaneurysm of right arm fistula and required resection of distal fistula and necrotic tissue with conversion to right upper arm fistula.  She required wound VAC.  Also developed atrial flutter during admission and was started on Eliquis.  She alsodeveloped some altered mental status and underwent evaluation by neurology with no acute findings on EEG, recommended outpatient follow-up.  Overall, states she is feeling much better since that time.  Does not think she has lost any significant weight since hospital discharge.   Bowels moving well. No brbpr or melena.   Think she completed  a stool test after her last visit with me.  Not interested in surgery or procedures at this time following recent prolonged hospitalization.  Prefers to wait a while and monitor.   Past Medical History:  Diagnosis Date   Anemia    Anxiety    Atrial flutter ([place]) 02/22/2015   C. difficile colitis 10/10/11   Chronic diarrhea    Chronic pain    Depression     ESRD on hemodialysis [facility])    Dialysis [name]   GERD (gastroesophageal reflux disease)    HIV (human immunodeficiency virus infection) ([place])    Hypertension    Insomnia    Intracranial hemorrhage (HCC)    Muscle weakness (generalized)    Pulmonary HTN (HCC)    Tachycardia    TIA (transient ischemic attack)    [facility] do not have this dx   Traumatic hematoma of right upper arm 02/22/2015    Past Surgical History:  Procedure Laterality Date   A/V FISTULAGRAM N/A 04/17/2016   Procedure: A/V Fistulagram - Right Upper;  Surgeon: Waynetta Sandy, MD;  Location: [place] CV LAB;  Service: Cardiovascular;  Laterality: N/A;   A/V FISTULAGRAM Right 08/28/2019   Procedure: A/V FISTULAGRAM;  Surgeon: Waynetta Sandy, MD;  Location: [place] CV LAB;  Service: Cardiovascular;  Laterality: Right;   ABDOMINAL HYSTERECTOMY     BIOPSY  01/02/2020   Procedure: BIOPSY;  Surgeon: Eloise Harman, DO;  [facility];  Service: Endoscopy;;   BIOPSY  07/11/2020   Procedure: BIOPSY;  Surgeon: Irving Copas., MD;  Location: [place];  Service: Gastroenterology;;   BIOPSY THYROID     CARPAL TUNNEL RELEASE Right 11/16/2019   Procedure: CARPAL TUNNEL RELEASE;  Surgeon: Leanora Cover, MD;  Location: [place];  Service: Orthopedics;  Laterality: Right;   COLONOSCOPY WITH PROPOFOL N/A 01/02/2020   Surgeon: Eloise Harman, DO;nonbleeding internal hemorrhoids, diverticulosis, status post biopsies of the entire colon (benign). Due for repeat in 2026.   Dialysis Shunts     previous one removed from left arm and now present one in right arm   DIALYSIS/PERMA CATHETER INSERTION Right 04/17/2016   Procedure: dialysis Catheter Insertion central veinous;  Surgeon: Waynetta Sandy, MD;  Location: [place] CV LAB;  Service: Cardiovascular;  Laterality: Right;   ESOPHAGOGASTRODUODENOSCOPY  12/15/2010   [place]k: erosive reflux  esophagitis, MW tear, hiatal hernia (small), gastritis with no h.pylori, possibly nsaid related   ESOPHAGOGASTRODUODENOSCOPY (EGD) WITH PROPOFOL N/A 07/11/2020    Surgeon: Irving Copas., MD; normal esophagus, 4 cm hiatal hernia, gastritis biopsied (mild chronic gastritis, negative H. pylori), single duodenal polyp (Brunner's gland hyperplasia), s/p duodenal biopsy benign, duodenal narrowing at the duodenal sweep.   EUS N/A 07/11/2020    Surgeon: Irving Copas., MD;  dilated CBD and common hepatic duct without stones or sludge in the bile duct, stones and biliary sludge in the gallbladder, pancreatic parenchyma with hyperechoic strands, pancreatic duct dilated with prominent sidebranches in the neck and body and tail, no pancreatic mass though visulalization limited, no ampullary lesion, no malignant appearing lymph node   EXCISION OF BREAST BIOPSY Right 05/04/2012   Procedure: EXCISION OF BREAST BIOPSY;  Surgeon: Donato Heinz, MD;  Location: [facility];  Service: General;  Laterality: Right;  Right Excisional Breast Biopsy   FISTULOGRAM Right 03/26/2014   Procedure: Right Arm Fistulogram with Venoplasty Right Subclavian Vein and Inominate Vein. Debridement Fistula Ulcer;  Surgeon: Conrad Arispe,  MD;  Location: [place];  Service: Vascular;  Laterality: Right;   FISTULOGRAM Right 03/29/2017   Procedure: FISTULOGRAM COMPLEX RIGHT ARM with Balloon angioplasty;  Surgeon: Waynetta Sandy, MD;  Location: [place];  Service: Vascular;  Laterality: Right;   HEMOSTASIS CLIP PLACEMENT  07/11/2020   Procedure: HEMOSTASIS CLIP PLACEMENT;  Surgeon: Irving Copas., MD;  Location: [place];  Service: Gastroenterology;;   IR GENERIC HISTORICAL  05/19/2016   IR REMOVAL TUN CV CATH W/O FL 05/19/2016 Markus Daft, MD [facility]-INTER[facility] RAD   LIGATION OF ARTERIOVENOUS  FISTULA Right 11/12/2020   Procedure: LIGATION OF ARTERIOVENOUS  FISTULA WITH EXCISION OF NECROTIC TISSUE;  Surgeon: Waynetta Sandy, MD;  Location: [place];  Service: Vascular;  Laterality: Right;   PERIPHERAL VASCULAR BALLOON ANGIOPLASTY Right 04/17/2016   Procedure: Peripheral Vascular Balloon Angioplasty;  Surgeon: Waynetta Sandy, MD;  Location: [place] CV LAB;  Service: Cardiovascular;  Laterality: Right;  Central segment AV Fistula   PERIPHERAL VASCULAR BALLOON ANGIOPLASTY Right 03/14/2018   Procedure: PERIPHERAL VASCULAR BALLOON ANGIOPLASTY;  Surgeon: Waynetta Sandy, MD;  Location: [place] CV LAB;  Service: Cardiovascular;  Laterality: Right;  subclavian vein   PERIPHERAL VASCULAR BALLOON ANGIOPLASTY Right 08/28/2019   Procedure: PERIPHERAL VASCULAR BALLOON ANGIOPLASTY;  Surgeon: Waynetta Sandy, MD;  Location: [place] [facility];  Service: Cardiovascular;  Laterality: Right;  upper arm   PERIPHERAL VASCULAR BALLOON ANGIOPLASTY Right 11/11/2020   Procedure: PERIPHERAL VASCULAR BALLOON ANGIOPLASTY;  Surgeon: Waynetta Sandy, MD;  Location: [place] CV LAB;  Service: Cardiovascular;  Laterality: Right;   PERIPHERAL VASCULAR INTERVENTION Right 03/14/2018   Procedure: PERIPHERAL VASCULAR INTERVENTION;  Surgeon: Waynetta Sandy, MD;  Location: [place] CV LAB;  Service: Cardiovascular;  Laterality: Right;  subclavian vein   POLYPECTOMY  07/11/2020   Procedure: POLYPECTOMY;  Surgeon: Mansouraty, Telford Nab., MD;  Location: [facility] ENDOSCOPY;  Service: Gastroenterology;;   REVISON OF ARTERIOVENOUS FISTULA Right 11/12/2020   Procedure: CREATION OF RIGHT ARM ARTERIOVENOUS FISTULA;  Surgeon: Waynetta Sandy, MD;  Location: [place];  Service: Vascular;  Laterality: Right;   SHUNTOGRAM Right 10/19/2013   Procedure: FISTULOGRAM;  Surgeon: Conrad Lovelady, MD;  Location: [facility] CATH LAB;  Service: Cardiovascular;  Laterality: Right;   TONSILLECTOMY     VISCERAL VENOGRAPHY Right 03/14/2018   Procedure: CENTRAL VENOGRAPHY;  Surgeon: Waynetta Sandy, MD;   Location: [place] [facility] LAB;  Service: Cardiovascular;  Laterality: Right;  upper ext fistula    Current Outpatient Medications  Medication Sig Dispense Refill   Amino Acids-Protein Hydrolys (PRO-STAT 64 PO) Take by mouth. Give 36ml by mouth in the morning for wound healing     apixaban (ELIQUIS) 2.5 MG TABS tablet Take 1 tablet (2.5 mg total) by mouth 2 (two) times daily. 60 tablet 0   clopidogrel (PLAVIX) 75 MG tablet Take 1 tablet (75 mg total) by mouth daily with breakfast. (Patient taking differently: Take 75 mg by mouth daily with breakfast. (0800)) 30 tablet    collagenase (SANTYL) ointment Apply 1 application topically daily. Apply to sacrum topically every day shift for wound healing     cyanocobalamin (,VITAMIN B-12,) 1000 MCG/ML injection Inject 1,000 mcg into the muscle every 30 (thirty) days. On the 28th of every month     dolutegravir (TIVICAY) 50 MG tablet Take 50 mg by mouth daily at 4 PM.     Emollient (MINERIN) LOTN Apply 1 application topically every 12 (twelve) hours as needed (dry skin). Apply  to feet and ankles     fentaNYL (DURAGESIC) 25 MCG/HR Place 1 patch onto the skin every 3 (three) days. 3 patch 0   lamivudine (EPIVIR) 100 MG tablet Take 50 mg by mouth daily. (0800)     Lidocaine HCl 4 % CREA Apply 1 application topically every 12 (twelve) hours as needed (pain).     lidocaine-prilocaine (EMLA) cream Apply 1 application topically See admin instructions. (0900) Apply to fistula site one hour before dialysis on Tuesday, Thursday, and Saturday.     melatonin 3 MG TABS tablet Take 3 mg by mouth at bedtime. (2000)     methadone (DOLOPHINE) 5 MG tablet Take 0.5 tablets (2.5 mg total) by mouth daily at 8 pm. (2000) 3 tablet 0   metoprolol tartrate (LOPRESSOR) 25 MG tablet Take 1 tablet (25 mg total) by mouth 2 (two) times daily. 60 tablet 0   mirtazapine (REMERON) 7.5 MG tablet Take 7.5 mg by mouth at bedtime. (2100)     Multiple Vitamin (MULTIVITAMIN WITH MINERALS) TABS  tablet Take 1 tablet by mouth at bedtime. (2000)     nitroGLYCERIN (NITROSTAT) 0.4 MG SL tablet Place 0.4 mg under the tongue every 5 (five) minutes x 3 doses as needed for chest pain.      ondansetron (ZOFRAN ODT) 8 MG disintegrating tablet Take 1 tablet (8 mg total) by mouth every 8 (eight) hours as needed for nausea or vomiting. 30 tablet 3   Oxycodone HCl 10 MG TABS Take 1 tablet (10 mg total) by mouth every 12 (twelve) hours as needed (severe pain). 4 tablet 0   pantoprazole (PROTONIX) 40 MG tablet Take 1 tablet (40 mg total) by mouth daily before breakfast. 30 tablet 3   promethazine (PHENERGAN) 12.5 MG tablet Take 12.5 mg by mouth See admin instructions. Take 1 tablet (12.5 mg) by mouth in the morning every Tuesday, Thursday and Saturday for nausea/vomiting and every 6 hours as needed for n/v     senna (SENOKOT) 8.6 MG tablet Take 2 tablets by mouth every evening. (2100)     venlafaxine XR (EFFEXOR-XR) 150 MG 24 hr capsule Take 150 mg by mouth daily with breakfast. (0900)     zidovudine (RETROVIR) 100 MG capsule Take 100 mg by mouth 3 (three) times daily. (0900, 1300, & 2100)     No current facility-administered medications for this visit.   Facility-Administered Medications Ordered in Other Visits  Medication Dose Route Frequency Provider Last Rate Last Admin   0.9 %  sodium chloride infusion (Manually program via Guardrails IV Fluids)  250 mL Intravenous Once Lockamy, Randi L, NP-C       acetaminophen (TYLENOL) tablet 650 mg  650 mg Oral Once Lockamy, Randi L, NP-C       diphenhydrAMINE (BENADRYL) capsule 25 mg  25 mg Oral Once Lockamy, Randi L, NP-C       sodium chloride flush (NS) 0.9 % injection 10 mL  10 mL Intracatheter PRN Lockamy, Randi L, NP-C        Allergies as of 12/16/2020 - Review Complete 12/16/2020  Allergen Reaction Noted   Lactose intolerance (gi) Other (See Comments) 03/29/2017   Latex Rash and Itching 03/09/2011   Penicillins Rash and Swelling 04/02/2011     Family History  Problem Relation Age of Onset   Anesthesia problems Neg Hx    Hypotension Neg Hx    Malignant hyperthermia Neg Hx    Pseudochol deficiency Neg Hx    Colon cancer Neg Hx  Unsure of parents who both died when she was a baby    Social History   Socioeconomic History   Marital status: Widowed    Spouse name: Not on file   Number of children: 1   Years of education: Not on file   Highest education level: Not on file  Occupational History   Not on file  Tobacco Use   Smoking status: Former    Types: Cigarettes    Quit date: 03/12/1983    Years since quitting: 37.7   Smokeless tobacco: Never  Vaping Use   Vaping Use: Never used  Substance and Sexual Activity   Alcohol use: No    Alcohol/week: 0.0 standard drinks   Drug use: No   Sexual activity: Not Currently    Birth control/protection: Surgical    Comment: Hysterectomy  Other Topics Concern   Not on file  Social History Narrative   Not on file   Social Determinants of Health   Financial Resource Strain: Not on file  Food Insecurity: Not on file  Transportation Needs: Not on file  Physical Activity: Not on file  Stress: Not on file  Social Connections: Not on file    Review of Systems: Gen: Denies fever, chills, cold or flulike symptoms, presyncope, syncope. CV: Denies chest pain, palpitations. Resp: Denies dyspnea or cough. GI: See HPI Heme: See HPI  Physical Exam: BP 122/74   Pulse 71   Temp (!) 97.3 F (36.3 C) (Temporal)   Ht 5\' 2"  (1.575 m)   Wt 88 lb (39.9 kg) Comment: per nursing home record on 12/03/20  LMP  (LMP Unknown)   BMI 16.10 kg/m  General:   Alert and oriented. No distress noted. Pleasant and cooperative.  In wheelchair. Head:  Normocephalic and atraumatic. Eyes:  Conjuctiva clear without scleral icterus. Mouth:  Oral mucosa pink and moist. Good dentition. No lesions. Heart:  S1, S2 present without murmurs appreciated. Lungs:  Clear to auscultation  bilaterally. No wheezes, rales, or rhonchi. No distress.  Abdomen:  +BS, soft, non-tender and non-distended. No rebound or guarding. No HSM or masses noted.  Exam limited due to patient in wheelchair. Msk:  Symmetrical without gross deformities. Normal posture. Extremities:  Without edema. Neurologic:  Alert and  oriented x4 Psych:  Normal mood and affect.    Assessment: 70 y.o. female who resides at a [place] facility with history of ESRD on hemodialysis, HIV, hypertension, chronic pain syndrome, nausea/vomiting, intermittent epigastric pain, and noted dilated CBD and pancreatic duct in 2021 s/p EGD/EUS May 2022 with no significant findings to explain biliary/pancreatic duct dilation, suspected to be secondary to chronic narcotics with recommendations to repeat MRI/MRCP in 6-12 months.  She is presenting today for follow-up of nausea/vomiting, epigastric pain.  Also with GERD, dysphagia, and weight loss.   Nausea/vomiting/epigastric pain:  Present for 6 months to a year.  Occurring several days a week and occasionally associated with epigastric pain.  N/V primarily on dialysis days though intermittent symptoms on nondialysis days which can occur before or after meals.  Epigastric pain seems primarily triggered by fattier foods.  Previously denied GERD symptoms though noted some improvement on omeprazole 40 mg daily.  I had recommended switching to pantoprazole 40 mg twice daily at her last visit, but patient is not taking pantoprazole and cannot remember if she ever took this medication, and is now reporting GERD.  Symptoms likely multifactorial.  Dialysis is a known trigger. GERD and gastritis (noted on EGD in May)  may also be contributing.  Also noted duodenal narrowing at the duodenal sweep on EGD in May, ? clinical significance.  She does have known gallstones and biliary sludge which very well may be contributing to her postprandial symptoms. Considered HIDA, but study will likely be  altered due to chronic narcotic use.    We will start Protonix 40 mg daily, increase Zofran to 8 mg q8 hrs PRN as she feels 4 mg is not strong enough. Discussed referral to general surgery for consideration of cholecystectomy; however, patient prefers to hold off on this for now and see how she does with resuming PPI daily. May need to consider small bowel follow though to ensure no significant duodenal narrowing if symptoms persist.   GERD:  Not adequately managed off PPI.  Recommend Protonix 40 mg daily.  Dysphagia:  Intermittent solid food dysphagia.  This is in the setting of GERD, off PPI.  Notably, EGD in May 2022 with normal-appearing esophagus.  Symptoms may be secondary to uncontrolled GERD.  Cannot rule out development of esophageal web, ring, or stricture.  Patient prefers to hold off on repeat EGD for now.  We will proceed BPE for further evaluation.  Weight loss:  13 pound weight loss over the last 4 months.  Likely multifactorial in the setting of chronic, intermittent nausea/vomiting discussed above, decreased intake secondary to poor tasting food at [facility], and recent prolonged hospitalization in September which patient reports is where she lost most of her weight.  Denies any significant weight loss since hospital discharge on 9/28.  Colonoscopy up-to-date November 2021 with no significant abnormalities.  EGD on file May 2022.  We are addressing nausea/vomiting as per above. Discussed eating to live even though she does not like the food at [facility].  Recommended 3 meals per day, 2 nutritional supplements per day, and offer patient 2 snacks per day.  Continue to monitor.  Pancreatic abnormality:  Hyperechoic strands noted in the entire pancreas on EUS May 2022.  Dr. Rush Landmark recommended fecal elastase to evaluate for EPI which I had ordered in June, but did not receive results.  She has no symptoms of EPI. Requested and received fecal elastase results from [facility]  after patient left the office today.  Fecal elastase was greater than 500, not consistent with EPI.    Biliary/pancreatic duct dilation:  Suspected to be secondary to chronic opioid use. Due for follow-up MRI/MRCP in November.  Encouragingly, LFTs have remained normal, last on 11/21/20.  No abdominal TTP on exam today.  She does continue with intermittent nausea/vomiting and intermittent epigastric pain without significant change discussed above, likely multifactorial and unclear if biliary etiology may be contributing, but patient is requesting to hold off on surgical consult. We will keep plans for MRI/MRCP in November. N/V/epigastric pain addressed above.    Plan:  BPE Start Protonix 40 mg daily 30 minutes before breakfast. Increase Zofran to 8 mg every 8 hours as needed. Hold off on surgical referral per patient request. MRI/MRCP November 2022. Low-fat diet. Reinforced GERD diet/lifestyle.  Written instructions provided (see AVS). Eat 3 meals per day.  Requested [place] to provide 2 nutritional supplements per day and offer 2 snacks per day. Follow-up in 3 months.  Advised to call with questions or concerns prior.   Aliene Altes, PA-C [facility] Gastroenterology 12/16/2020

## 2020-12-16 ENCOUNTER — Telehealth: Payer: Self-pay | Admitting: Gastroenterology

## 2020-12-16 ENCOUNTER — Encounter: Payer: Self-pay | Admitting: Gastroenterology

## 2020-12-16 ENCOUNTER — Encounter: Payer: Self-pay | Admitting: *Deleted

## 2020-12-16 ENCOUNTER — Ambulatory Visit (INDEPENDENT_AMBULATORY_CARE_PROVIDER_SITE_OTHER): Payer: Medicare Other | Admitting: Gastroenterology

## 2020-12-16 ENCOUNTER — Other Ambulatory Visit: Payer: Self-pay

## 2020-12-16 VITALS — BP 122/74 | HR 71 | Temp 97.3°F | Ht 62.0 in | Wt 88.0 lb

## 2020-12-16 DIAGNOSIS — R1310 Dysphagia, unspecified: Secondary | ICD-10-CM | POA: Diagnosis not present

## 2020-12-16 DIAGNOSIS — R634 Abnormal weight loss: Secondary | ICD-10-CM | POA: Insufficient documentation

## 2020-12-16 DIAGNOSIS — K219 Gastro-esophageal reflux disease without esophagitis: Secondary | ICD-10-CM

## 2020-12-16 DIAGNOSIS — R112 Nausea with vomiting, unspecified: Secondary | ICD-10-CM | POA: Diagnosis not present

## 2020-12-16 DIAGNOSIS — K838 Other specified diseases of biliary tract: Secondary | ICD-10-CM

## 2020-12-16 DIAGNOSIS — R1013 Epigastric pain: Secondary | ICD-10-CM | POA: Diagnosis not present

## 2020-12-16 DIAGNOSIS — K8689 Other specified diseases of pancreas: Secondary | ICD-10-CM

## 2020-12-16 MED ORDER — ONDANSETRON 8 MG PO TBDP: 8.0000 mg | ORAL_TABLET | Freq: Three times a day (TID) | ORAL | 3 refills | Status: AC | PRN

## 2020-12-16 MED ORDER — PANTOPRAZOLE SODIUM 40 MG PO TBEC: 40.0000 mg | DELAYED_RELEASE_TABLET | Freq: Every day | ORAL | 3 refills | Status: AC

## 2020-12-16 NOTE — Patient Instructions (Addendum)
For your trouble swallowing, we are arranging for you to have a x-ray study of your esophagus at [facility].  For acid reflux, start Protonix 40 mg daily 30 minutes for breakfast.  We are increasing your Zofran to 8 mg every 8 hours as needed.  As we discussed, you do have gallstones and sludge in your gallbladder which could contribute to nausea/vomiting/upper abdominal pain after eating, but we will hold off on surgical referral for now as you requested.  Follow a low-fat diet.  Avoid fried, fatty, greasy items.  Follow a GERD diet:  Avoid fried, fatty, greasy, spicy, citrus foods. Avoid caffeine and carbonated beverages. Avoid chocolate. Try eating 4-6 small meals a day rather than 3 large meals. Do not eat within 3 hours of laying down. Prop head of bed up on wood or bricks to create a 6 inch incline.  As we discussed, it is important that we focus on eating to live.  I am sorry that the food at [facility] is not very good. Try eating at least 3 meals per day and 2 snacks per day.  I have also requested [facility] provide you with 2 nutritional supplements per day.  We will plan to see you back in 3 months.  Do not hesitate to call if you have any questions or concerns prior to your next visit.  It was a pleasure seeing you again today!  Aliene Altes, PA-C [facility] Gastroenterology

## 2020-12-16 NOTE — Telephone Encounter (Signed)
[name], please move patient's recall for MRI/MRCP up to mid November.

## 2020-12-17 NOTE — Addendum Note (Signed)
Addended by: Hassan Rowan on: 12/17/2020 08:15 AM   Modules accepted: Orders

## 2020-12-17 NOTE — Telephone Encounter (Signed)
Noted  

## 2020-12-17 NOTE — Telephone Encounter (Signed)
MRI/MRCP scheduled for 01/15/21 at 9:00am, arrive at 8:30am. NPO 4 hours prior to test.  [name], informed Marliss Coots of MRI appt. She also verified BPE appt.  Stacey, per AVS she needs f/u appt in 3 months. Marliss Coots will call back to get f/u appt.

## 2020-12-18 ENCOUNTER — Ambulatory Visit (INDEPENDENT_AMBULATORY_CARE_PROVIDER_SITE_OTHER): Payer: Medicare Other | Admitting: Physician Assistant

## 2020-12-18 ENCOUNTER — Other Ambulatory Visit: Payer: Self-pay

## 2020-12-18 VITALS — BP 131/75 | HR 73 | Temp 97.9°F | Resp 16 | Ht 62.0 in | Wt 88.0 lb

## 2020-12-18 DIAGNOSIS — Z992 Dependence on renal dialysis: Secondary | ICD-10-CM

## 2020-12-18 DIAGNOSIS — N186 End stage renal disease: Secondary | ICD-10-CM

## 2020-12-18 NOTE — Progress Notes (Signed)
POST OPERATIVE OFFICE NOTE    CC:  F/u for surgery  HPI:  This is a 70 y.o. female who is s/p Procedure Performed: 1.  Ligation and resection of right arm radiocephalic AV fistula 2.  Turndown of upper arm cephalic vein to brachial artery creation of brachial cephalic AV fistula on 1/61/09 by Dr. Donzetta Matters prior to this she underwent drug-coated balloon angioplasty of her right innominate vein stent for in-stent stenosis. 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  Pt returns today for follow up.  Pt states the dressing changes are easier and less painful.    Allergies  Allergen Reactions   Lactose Intolerance (Gi) Other (See Comments)    On MAR   Latex Rash and Itching   Penicillins Rash and Swelling    Has patient had a PCN reaction causing immediate rash, facial/tongue/throat swelling, SOB or lightheadedness with hypotension: No Has patient had a PCN reaction causing severe rash involving mucus membranes or skin necrosis: No Has patient had a PCN reaction that required hospitalization No Has patient had a PCN reaction occurring within the last 10 years: No If all of the above answers are "NO", then may proceed with Cephalosporin use.      Current Outpatient Medications  Medication Sig Dispense Refill   Amino Acids-Protein Hydrolys (PRO-STAT 64 PO) Take by mouth. Give 10ml by mouth in the morning for wound healing     apixaban (ELIQUIS) 2.5 MG TABS tablet Take 1 tablet (2.5 mg total) by mouth 2 (two) times daily. 60 tablet 0   clopidogrel (PLAVIX) 75 MG tablet Take 1 tablet (75 mg total) by mouth daily with breakfast. (Patient taking differently: Take 75 mg by mouth daily with breakfast. (0800)) 30 tablet    collagenase (SANTYL) ointment Apply 1 application topically daily. Apply to sacrum topically every day shift for wound healing     cyanocobalamin (,VITAMIN B-12,) 1000 MCG/ML injection Inject  1,000 mcg into the muscle every 30 (thirty) days. On the 28th of every month     dolutegravir (TIVICAY) 50 MG tablet Take 50 mg by mouth daily at 4 PM.     Emollient (MINERIN) LOTN Apply 1 application topically every 12 (twelve) hours as needed (dry skin). Apply to feet and ankles     fentaNYL (DURAGESIC) 25 MCG/HR Place 1 patch onto the skin every 3 (three) days. 3 patch 0   lamivudine (EPIVIR) 100 MG tablet Take 50 mg by mouth daily. (0800)     Lidocaine HCl 4 % CREA Apply 1 application topically every 12 (twelve) hours as needed (pain).     lidocaine-prilocaine (EMLA) cream Apply 1 application topically See admin instructions. (0900) Apply to fistula site one hour before dialysis on Tuesday, Thursday, and Saturday.     melatonin 3 MG TABS tablet Take 3 mg by mouth at bedtime. (2000)     methadone (DOLOPHINE) 5 MG tablet Take 0.5 tablets (2.5 mg total) by mouth daily at 8 pm. (2000) 3 tablet 0   metoprolol tartrate (LOPRESSOR) 25 MG tablet Take 1 tablet (25 mg total) by mouth 2 (two) times daily. 60 tablet 0   mirtazapine (REMERON) 7.5 MG tablet Take 7.5 mg by mouth at bedtime. (2100)     Multiple Vitamin (MULTIVITAMIN WITH MINERALS) TABS tablet Take 1 tablet by mouth at bedtime. (2000)     nitroGLYCERIN (NITROSTAT) 0.4 MG SL tablet Place 0.4 mg under the tongue every  5 (five) minutes x 3 doses as needed for chest pain.      ondansetron (ZOFRAN ODT) 8 MG disintegrating tablet Take 1 tablet (8 mg total) by mouth every 8 (eight) hours as needed for nausea or vomiting. 30 tablet 3   Oxycodone HCl 10 MG TABS Take 1 tablet (10 mg total) by mouth every 12 (twelve) hours as needed (severe pain). 4 tablet 0   pantoprazole (PROTONIX) 40 MG tablet Take 1 tablet (40 mg total) by mouth daily before breakfast. 30 tablet 3   promethazine (PHENERGAN) 12.5 MG tablet Take 12.5 mg by mouth See admin instructions. Take 1 tablet (12.5 mg) by mouth in the morning every Tuesday, Thursday and Saturday for nausea/vomiting  and every 6 hours as needed for n/v     senna (SENOKOT) 8.6 MG tablet Take 2 tablets by mouth every evening. (2100)     venlafaxine XR (EFFEXOR-XR) 150 MG 24 hr capsule Take 150 mg by mouth daily with breakfast. (0900)     zidovudine (RETROVIR) 100 MG capsule Take 100 mg by mouth 3 (three) times daily. (0900, 1300, & 2100)     No current facility-administered medications for this visit.   Facility-Administered Medications Ordered in Other Visits  Medication Dose Route Frequency Provider Last Rate Last Admin   0.9 %  sodium chloride infusion (Manually program via Guardrails IV Fluids)  250 mL Intravenous Once Lockamy, Randi L, NP-C       acetaminophen (TYLENOL) tablet 650 mg  650 mg Oral Once Lockamy, Randi L, NP-C       diphenhydrAMINE (BENADRYL) capsule 25 mg  25 mg Oral Once Lockamy, Randi L, NP-C       sodium chloride flush (NS) 0.9 % injection 10 mL  10 mL Intracatheter PRN Lockamy, Randi L, NP-C         ROS:  See HPI  Physical Exam:     Incision:  healing well with good granulation base. Extremities:  palpable radial pulse and palpable thrill in the fistula  Neuro: sensation intact without symptoms of steal    Assessment/Plan:  This is a 70 y.o. female who is S/P 1.  Ligation and resection of right arm radiocephalic AV fistula 2.  Turndown of upper arm cephalic vein to brachial artery creation of brachial cephalic AV fistula on 07/05/67 by Dr. Donzetta Matters prior to this she underwent drug-coated balloon angioplasty of her right innominate vein stent for in-stent stenosis.  The wound is healing nicely.  She has HD without problems using the UE av fistula.  She will f/u in 4 weeks for wound check.  Continue wet to dry dressings until wound is healed.     Roxy Horseman PA-C [facility] [phone]   Clinic MD:  Donzetta Matters

## 2020-12-24 ENCOUNTER — Encounter (HOSPITAL_COMMUNITY): Payer: Self-pay | Admitting: Emergency Medicine

## 2020-12-24 ENCOUNTER — Inpatient Hospital Stay (HOSPITAL_COMMUNITY)
Admission: EM | Admit: 2020-12-24 | Discharge: 2021-01-09 | DRG: 252 | Disposition: A | Discharge status: Skilled Nursing Facility | Payer: Medicare Other | Attending: Family Medicine | Admitting: Family Medicine

## 2020-12-24 ENCOUNTER — Emergency Department (HOSPITAL_COMMUNITY): Payer: Medicare Other

## 2020-12-24 ENCOUNTER — Other Ambulatory Visit: Payer: Self-pay

## 2020-12-24 DIAGNOSIS — N186 End stage renal disease: Secondary | ICD-10-CM | POA: Diagnosis present

## 2020-12-24 DIAGNOSIS — Z992 Dependence on renal dialysis: Secondary | ICD-10-CM

## 2020-12-24 DIAGNOSIS — K2100 Gastro-esophageal reflux disease with esophagitis, without bleeding: Secondary | ICD-10-CM | POA: Diagnosis present

## 2020-12-24 DIAGNOSIS — Z681 Body mass index (BMI) 19 or less, adult: Secondary | ICD-10-CM

## 2020-12-24 DIAGNOSIS — Z91011 Allergy to milk products: Secondary | ICD-10-CM

## 2020-12-24 DIAGNOSIS — E43 Unspecified severe protein-calorie malnutrition: Secondary | ICD-10-CM | POA: Diagnosis not present

## 2020-12-24 DIAGNOSIS — E871 Hypo-osmolality and hyponatremia: Secondary | ICD-10-CM | POA: Diagnosis not present

## 2020-12-24 DIAGNOSIS — F32A Depression, unspecified: Secondary | ICD-10-CM | POA: Diagnosis not present

## 2020-12-24 DIAGNOSIS — Y832 Surgical operation with anastomosis, bypass or graft as the cause of abnormal reaction of the patient, or of later complication, without mention of misadventure at the time of the procedure: Secondary | ICD-10-CM | POA: Diagnosis present

## 2020-12-24 DIAGNOSIS — R52 Pain, unspecified: Secondary | ICD-10-CM

## 2020-12-24 DIAGNOSIS — I120 Hypertensive chronic kidney disease with stage 5 chronic kidney disease or end stage renal disease: Secondary | ICD-10-CM | POA: Diagnosis present

## 2020-12-24 DIAGNOSIS — Z91040 Latex allergy status: Secondary | ICD-10-CM

## 2020-12-24 DIAGNOSIS — G4700 Insomnia, unspecified: Secondary | ICD-10-CM | POA: Diagnosis present

## 2020-12-24 DIAGNOSIS — E875 Hyperkalemia: Secondary | ICD-10-CM | POA: Diagnosis not present

## 2020-12-24 DIAGNOSIS — I4891 Unspecified atrial fibrillation: Secondary | ICD-10-CM | POA: Diagnosis not present

## 2020-12-24 DIAGNOSIS — D7589 Other specified diseases of blood and blood-forming organs: Secondary | ICD-10-CM | POA: Diagnosis present

## 2020-12-24 DIAGNOSIS — Z20822 Contact with and (suspected) exposure to covid-19: Secondary | ICD-10-CM | POA: Diagnosis not present

## 2020-12-24 DIAGNOSIS — B9562 Methicillin resistant Staphylococcus aureus infection as the cause of diseases classified elsewhere: Secondary | ICD-10-CM | POA: Diagnosis present

## 2020-12-24 DIAGNOSIS — H9190 Unspecified hearing loss, unspecified ear: Secondary | ICD-10-CM | POA: Diagnosis present

## 2020-12-24 DIAGNOSIS — L988 Other specified disorders of the skin and subcutaneous tissue: Secondary | ICD-10-CM

## 2020-12-24 DIAGNOSIS — Z66 Do not resuscitate: Secondary | ICD-10-CM | POA: Diagnosis present

## 2020-12-24 DIAGNOSIS — E162 Hypoglycemia, unspecified: Secondary | ICD-10-CM | POA: Diagnosis not present

## 2020-12-24 DIAGNOSIS — I272 Pulmonary hypertension, unspecified: Secondary | ICD-10-CM | POA: Diagnosis present

## 2020-12-24 DIAGNOSIS — T827XXA Infection and inflammatory reaction due to other cardiac and vascular devices, implants and grafts, initial encounter: Secondary | ICD-10-CM | POA: Diagnosis not present

## 2020-12-24 DIAGNOSIS — N2581 Secondary hyperparathyroidism of renal origin: Secondary | ICD-10-CM | POA: Diagnosis not present

## 2020-12-24 DIAGNOSIS — L089 Local infection of the skin and subcutaneous tissue, unspecified: Secondary | ICD-10-CM | POA: Diagnosis not present

## 2020-12-24 DIAGNOSIS — R001 Bradycardia, unspecified: Secondary | ICD-10-CM | POA: Diagnosis not present

## 2020-12-24 DIAGNOSIS — I4892 Unspecified atrial flutter: Secondary | ICD-10-CM | POA: Diagnosis present

## 2020-12-24 DIAGNOSIS — Z87891 Personal history of nicotine dependence: Secondary | ICD-10-CM

## 2020-12-24 DIAGNOSIS — Z79891 Long term (current) use of opiate analgesic: Secondary | ICD-10-CM

## 2020-12-24 DIAGNOSIS — F419 Anxiety disorder, unspecified: Secondary | ICD-10-CM | POA: Diagnosis not present

## 2020-12-24 DIAGNOSIS — D638 Anemia in other chronic diseases classified elsewhere: Secondary | ICD-10-CM | POA: Diagnosis not present

## 2020-12-24 DIAGNOSIS — T82856A Stenosis of peripheral vascular stent, initial encounter: Secondary | ICD-10-CM | POA: Diagnosis not present

## 2020-12-24 DIAGNOSIS — B20 Human immunodeficiency virus [HIV] disease: Secondary | ICD-10-CM | POA: Diagnosis present

## 2020-12-24 DIAGNOSIS — I10 Essential (primary) hypertension: Secondary | ICD-10-CM | POA: Diagnosis not present

## 2020-12-24 DIAGNOSIS — D631 Anemia in chronic kidney disease: Secondary | ICD-10-CM | POA: Diagnosis not present

## 2020-12-24 DIAGNOSIS — I953 Hypotension of hemodialysis: Secondary | ICD-10-CM | POA: Diagnosis not present

## 2020-12-24 DIAGNOSIS — K219 Gastro-esophageal reflux disease without esophagitis: Secondary | ICD-10-CM | POA: Diagnosis not present

## 2020-12-24 DIAGNOSIS — Z90710 Acquired absence of both cervix and uterus: Secondary | ICD-10-CM

## 2020-12-24 DIAGNOSIS — Z7189 Other specified counseling: Secondary | ICD-10-CM | POA: Diagnosis not present

## 2020-12-24 DIAGNOSIS — Z9889 Other specified postprocedural states: Secondary | ICD-10-CM | POA: Diagnosis not present

## 2020-12-24 DIAGNOSIS — Z8673 Personal history of transient ischemic attack (TIA), and cerebral infarction without residual deficits: Secondary | ICD-10-CM

## 2020-12-24 DIAGNOSIS — R7881 Bacteremia: Secondary | ICD-10-CM | POA: Diagnosis present

## 2020-12-24 DIAGNOSIS — Z7901 Long term (current) use of anticoagulants: Secondary | ICD-10-CM

## 2020-12-24 DIAGNOSIS — Z7902 Long term (current) use of antithrombotics/antiplatelets: Secondary | ICD-10-CM

## 2020-12-24 DIAGNOSIS — T829XXA Unspecified complication of cardiac and vascular prosthetic device, implant and graft, initial encounter: Secondary | ICD-10-CM

## 2020-12-24 DIAGNOSIS — Z515 Encounter for palliative care: Secondary | ICD-10-CM | POA: Diagnosis not present

## 2020-12-24 DIAGNOSIS — M898X9 Other specified disorders of bone, unspecified site: Secondary | ICD-10-CM | POA: Diagnosis present

## 2020-12-24 DIAGNOSIS — Z22322 Carrier or suspected carrier of Methicillin resistant Staphylococcus aureus: Secondary | ICD-10-CM

## 2020-12-24 DIAGNOSIS — G894 Chronic pain syndrome: Secondary | ICD-10-CM | POA: Diagnosis present

## 2020-12-24 DIAGNOSIS — Z792 Long term (current) use of antibiotics: Secondary | ICD-10-CM

## 2020-12-24 DIAGNOSIS — Z79899 Other long term (current) drug therapy: Secondary | ICD-10-CM

## 2020-12-24 DIAGNOSIS — Z880 Allergy status to penicillin: Secondary | ICD-10-CM

## 2020-12-24 DIAGNOSIS — D539 Nutritional anemia, unspecified: Secondary | ICD-10-CM | POA: Diagnosis present

## 2020-12-24 LAB — COMPREHENSIVE METABOLIC PANEL
ALT: 9 U/L (ref 0–44)
AST: 12 U/L — ABNORMAL LOW (ref 15–41)
Albumin: 2.9 g/dL — ABNORMAL LOW (ref 3.5–5.0)
Alkaline Phosphatase: 88 U/L (ref 38–126)
Anion gap: 17 — ABNORMAL HIGH (ref 5–15)
BUN: 87 mg/dL — ABNORMAL HIGH (ref 8–23)
CO2: 23 mmol/L (ref 22–32)
Calcium: 8.9 mg/dL (ref 8.9–10.3)
Chloride: 92 mmol/L — ABNORMAL LOW (ref 98–111)
Creatinine, Ser: 10.98 mg/dL — ABNORMAL HIGH (ref 0.44–1.00)
GFR, Estimated: 3 mL/min — ABNORMAL LOW (ref >60)
Glucose, Bld: 68 mg/dL — ABNORMAL LOW (ref 70–99)
Potassium: 5.3 mmol/L — ABNORMAL HIGH (ref 3.5–5.1)
Sodium: 132 mmol/L — ABNORMAL LOW (ref 135–145)
Total Bilirubin: 0.6 mg/dL (ref 0.3–1.2)
Total Protein: 7.8 g/dL (ref 6.5–8.1)

## 2020-12-24 LAB — CBC WITH DIFFERENTIAL/PLATELET
Abs Immature Granulocytes: 0.04 10*3/uL (ref 0.00–0.07)
Basophils Absolute: 0 10*3/uL (ref 0.0–0.1)
Basophils Relative: 0 %
Eosinophils Absolute: 0 10*3/uL (ref 0.0–0.5)
Eosinophils Relative: 0 %
HCT: 25.7 % — ABNORMAL LOW (ref 36.0–46.0)
Hemoglobin: 7.8 g/dL — ABNORMAL LOW (ref 12.0–15.0)
Immature Granulocytes: 0 %
Lymphocytes Relative: 11 %
Lymphs Abs: 1 10*3/uL (ref 0.7–4.0)
MCH: 33.3 pg (ref 26.0–34.0)
MCHC: 30.4 g/dL (ref 30.0–36.0)
MCV: 109.8 fL — ABNORMAL HIGH (ref 80.0–100.0)
Monocytes Absolute: 0.5 10*3/uL (ref 0.1–1.0)
Monocytes Relative: 5 %
Neutro Abs: 7.6 10*3/uL (ref 1.7–7.7)
Neutrophils Relative %: 84 %
Platelets: 205 10*3/uL (ref 150–400)
RBC: 2.34 MIL/uL — ABNORMAL LOW (ref 3.87–5.11)
RDW: 17 % — ABNORMAL HIGH (ref 11.5–15.5)
WBC: 9.2 10*3/uL (ref 4.0–10.5)
nRBC: 0 % (ref 0.0–0.2)

## 2020-12-24 LAB — MAGNESIUM: Magnesium: 2.2 mg/dL (ref 1.7–2.4)

## 2020-12-24 LAB — CBG MONITORING, ED: Glucose-Capillary: 57 mg/dL — ABNORMAL LOW (ref 70–99)

## 2020-12-24 LAB — LACTIC ACID, PLASMA
Lactic Acid, Venous: 1.6 mmol/L (ref 0.5–1.9)
Lactic Acid, Venous: 1 mmol/L (ref 0.5–1.9)

## 2020-12-24 LAB — RESP PANEL BY RT-PCR (FLU A&B, COVID) ARPGX2
Influenza A by PCR: NEGATIVE
Influenza B by PCR: NEGATIVE
SARS Coronavirus 2 by RT PCR: NEGATIVE

## 2020-12-24 IMAGING — DX DG CHEST 1V PORT
1 series · 1 of 1 positions shown · non-contrast
Comparison: [DATE]

CLINICAL DATA: Recent shunt placement, pain

EXAM:
PORTABLE CHEST 1 VIEW

[chest ap]
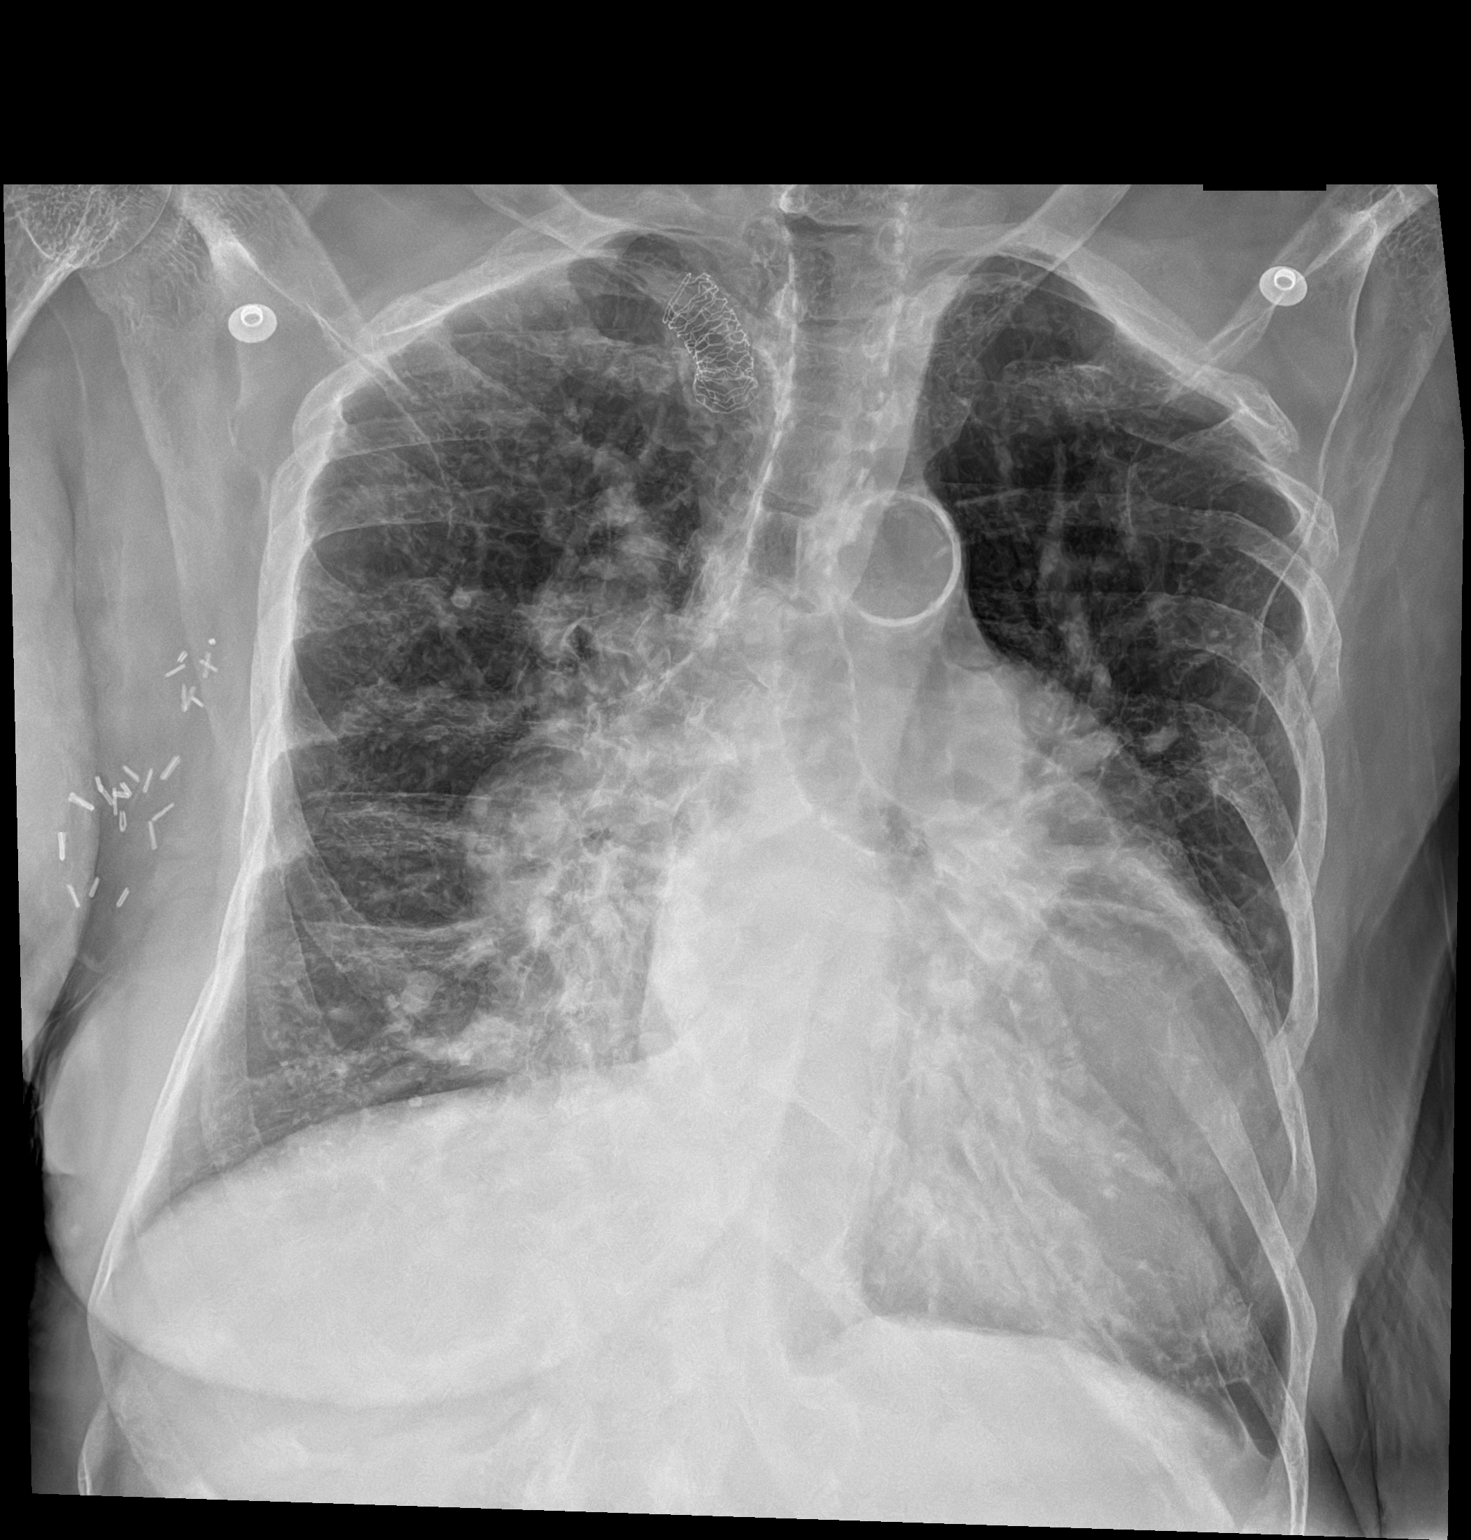

[1 of 1 positions shown; findings below may reference images not displayed]

FINDINGS: Gross cardiomegaly and pulmonary vascular prominence. No acute
appearing airspace opacity. Right brachiocephalic venous stent.
Surgical clips in the right axilla.
IMPRESSION: Gross cardiomegaly and pulmonary vascular prominence. No overt edema
or acute appearing airspace opacity.

## 2020-12-24 MED ORDER — HYDROMORPHONE HCL 1 MG/ML IJ SOLN
0.5000 mg | INTRAMUSCULAR | Status: DC | PRN
Start: 2020-12-24 — End: 2021-01-10
  Administered 2020-12-25 – 2021-01-09 (×21): 1 mg via INTRAVENOUS
  Filled 2020-12-24 (×24): qty 1

## 2020-12-24 MED ORDER — ACETAMINOPHEN 650 MG RE SUPP
650.0000 mg | Freq: Four times a day (QID) | RECTAL | Status: DC | PRN
  Administered 2020-12-24: 650 mg via RECTAL
  Filled 2020-12-24 (×3): qty 1

## 2020-12-24 MED ORDER — PANTOPRAZOLE SODIUM 40 MG PO TBEC
40.0000 mg | DELAYED_RELEASE_TABLET | Freq: Every day | ORAL | Status: DC
  Administered 2020-12-25 – 2021-01-09 (×15): 40 mg via ORAL
  Filled 2020-12-24 (×17): qty 1

## 2020-12-24 MED ORDER — ZIDOVUDINE 100 MG PO CAPS
100.0000 mg | ORAL_CAPSULE | Freq: Three times a day (TID) | ORAL | Status: DC
  Administered 2020-12-25: 100 mg via ORAL
  Filled 2020-12-24 (×11): qty 1

## 2020-12-24 MED ORDER — ONDANSETRON HCL 4 MG/2ML IJ SOLN
4.0000 mg | Freq: Four times a day (QID) | INTRAMUSCULAR | Status: DC | PRN
  Administered 2021-01-02 – 2021-01-07 (×3): 4 mg via INTRAVENOUS
  Filled 2020-12-24 (×4): qty 2

## 2020-12-24 MED ORDER — DEXTROSE 50 % IV SOLN
1.0000 | Freq: Once | INTRAVENOUS | Status: AC
  Administered 2020-12-24: 50 mL via INTRAVENOUS
  Filled 2020-12-24: qty 50

## 2020-12-24 MED ORDER — SODIUM ZIRCONIUM CYCLOSILICATE 5 G PO PACK
10.0000 g | PACK | Freq: Once | ORAL | Status: AC
  Administered 2020-12-24: 10 g via ORAL
  Filled 2020-12-24: qty 2

## 2020-12-24 MED ORDER — FENTANYL 25 MCG/HR TD PT72
1.0000 | MEDICATED_PATCH | TRANSDERMAL | Status: DC
  Administered 2020-12-27 – 2021-01-08 (×5): 1 via TRANSDERMAL
  Filled 2020-12-24 (×5): qty 1

## 2020-12-24 MED ORDER — LAMIVUDINE 100 MG PO TABS
50.0000 mg | ORAL_TABLET | Freq: Every day | ORAL | Status: DC
  Filled 2020-12-24 (×4): qty 1

## 2020-12-24 MED ORDER — VANCOMYCIN HCL IN DEXTROSE 1-5 GM/200ML-% IV SOLN
1000.0000 mg | Freq: Once | INTRAVENOUS | Status: AC
  Administered 2020-12-24: 1000 mg via INTRAVENOUS
  Filled 2020-12-24: qty 200

## 2020-12-24 MED ORDER — SODIUM CHLORIDE 0.9 % IV SOLN
2.0000 g | Freq: Once | INTRAVENOUS | Status: AC
  Administered 2020-12-24: 2 g via INTRAVENOUS
  Filled 2020-12-24: qty 2

## 2020-12-24 MED ORDER — NITROGLYCERIN 0.4 MG SL SUBL: 0.4000 mg | SUBLINGUAL_TABLET | SUBLINGUAL | Status: DC | PRN

## 2020-12-24 MED ORDER — ONDANSETRON HCL 4 MG PO TABS
4.0000 mg | ORAL_TABLET | Freq: Four times a day (QID) | ORAL | Status: DC | PRN
  Filled 2020-12-24: qty 1

## 2020-12-24 MED ORDER — METOPROLOL TARTRATE 25 MG PO TABS
25.0000 mg | ORAL_TABLET | Freq: Two times a day (BID) | ORAL | Status: DC
  Administered 2020-12-25 – 2020-12-28 (×8): 25 mg via ORAL
  Filled 2020-12-24 (×8): qty 1

## 2020-12-24 MED ORDER — HYDROMORPHONE HCL 1 MG/ML IJ SOLN
0.2500 mg | Freq: Once | INTRAMUSCULAR | Status: AC
Start: 2020-12-24 — End: 2020-12-24
  Administered 2020-12-24: 0.25 mg via INTRAMUSCULAR
  Filled 2020-12-24: qty 1

## 2020-12-24 NOTE — ED Notes (Signed)
Pt requested [facility] to be contacted, this RN spoke with nurse Brandy @ facility, nurse agreed to keep medical/admission information private per patients request.

## 2020-12-24 NOTE — Consult Note (Signed)
[facility]  Patient name: [name] MRN: [id number] DOB: 01-04-1951 Sex: female  REASON FOR VISIT: Patient seen in the [facility] emergency department regarding her dialysis access  HPI: [name] is a 70 y.o. female presented to the emergency department concerning pain in her right arm.  She recently underwent surgery at [facility] with Benjamin.  She had developed infection and necrosis in her right forearm AV fistula.  This was ligated and debrided and she had conversion to an upper arm fistula.  He presents today with pain at the antecubital space.  She was seen in our office on 12/18/2020 with good healing of her forearm area  No current facility-administered medications for this encounter.   Current Outpatient Medications  Medication Sig Dispense Refill   Amino Acids-Protein Hydrolys (PRO-STAT 64 PO) Take by mouth. Give 80ml by mouth in the morning for wound healing     apixaban (ELIQUIS) 2.5 MG TABS tablet Take 1 tablet (2.5 mg total) by mouth 2 (two) times daily. 60 tablet 0   clopidogrel (PLAVIX) 75 MG tablet Take 1 tablet (75 mg total) by mouth daily with breakfast. (Patient taking differently: Take 75 mg by mouth daily with breakfast. (0800)) 30 tablet    collagenase (SANTYL) ointment Apply 1 application topically daily. Apply to sacrum topically every day shift for wound healing     cyanocobalamin (,VITAMIN B-12,) 1000 MCG/ML injection Inject 1,000 mcg into the muscle every 30 (thirty) days. On the 28th of every month     dolutegravir (TIVICAY) 50 MG tablet Take 50 mg by mouth daily at 4 PM.     Emollient (MINERIN) LOTN Apply 1 application topically every 12 (twelve) hours as needed (dry skin). Apply to feet and ankles     fentaNYL (DURAGESIC) 25 MCG/HR Place 1 patch onto the skin every 3 (three) days. 3 patch 0   lamivudine (EPIVIR) 100 MG tablet Take 50 mg by mouth daily. (0800)      Lidocaine HCl 4 % CREA Apply 1 application topically every 12 (twelve) hours as needed (pain).     lidocaine-prilocaine (EMLA) cream Apply 1 application topically See admin instructions. (0900) Apply to fistula site one hour before dialysis on Tuesday, Thursday, and Saturday.     melatonin 3 MG TABS tablet Take 3 mg by mouth at bedtime. (2000)     methadone (DOLOPHINE) 5 MG tablet Take 0.5 tablets (2.5 mg total) by mouth daily at 8 pm. (2000) 3 tablet 0   metoprolol tartrate (LOPRESSOR) 25 MG tablet Take 1 tablet (25 mg total) by mouth 2 (two) times daily. 60 tablet 0   mirtazapine (REMERON) 7.5 MG tablet Take 7.5 mg by mouth at bedtime. (2100)     Multiple Vitamin (MULTIVITAMIN WITH MINERALS) TABS tablet Take 1 tablet by mouth at bedtime. (2000)     nitroGLYCERIN (NITROSTAT) 0.4 MG SL tablet Place 0.4 mg under the tongue every 5 (five) minutes x 3 doses as needed for chest pain.      ondansetron (ZOFRAN ODT) 8 MG disintegrating tablet Take 1 tablet (8 mg total) by mouth every 8 (eight) hours as needed for nausea or vomiting. 30 tablet 3   Oxycodone HCl 10 MG TABS Take 1 tablet (10 mg total) by mouth every 12 (twelve) hours as needed (severe pain). 4 tablet 0   pantoprazole (PROTONIX) 40 MG tablet Take 1 tablet (40 mg total) by mouth daily before breakfast. 30 tablet 3   promethazine (  PHENERGAN) 12.5 MG tablet Take 12.5 mg by mouth See admin instructions. Take 1 tablet (12.5 mg) by mouth in the morning every Tuesday, Thursday and Saturday for nausea/vomiting and every 6 hours as needed for n/v     senna (SENOKOT) 8.6 MG tablet Take 2 tablets by mouth every evening. (2100)     venlafaxine XR (EFFEXOR-XR) 150 MG 24 hr capsule Take 150 mg by mouth daily with breakfast. (0900)     zidovudine (RETROVIR) 100 MG capsule Take 100 mg by mouth 3 (three) times daily. (0900, 1300, & 2100)     Facility-Administered Medications Ordered in Other Encounters  Medication Dose Route Frequency Provider Last Rate Last  Admin   0.9 %  sodium chloride infusion (Manually program via Guardrails IV Fluids)  250 mL Intravenous Once Lockamy, Randi L, NP-C       acetaminophen (TYLENOL) tablet 650 mg  650 mg Oral Once Lockamy, Randi L, NP-C       diphenhydrAMINE (BENADRYL) capsule 25 mg  25 mg Oral Once Lockamy, Randi L, NP-C       sodium chloride flush (NS) 0.9 % injection 10 mL  10 mL Intracatheter PRN Lockamy, Randi L, NP-C         PHYSICAL EXAM: Vitals:   12/24/20 1233 12/24/20 1234  BP: 124/68   Pulse: 75   Resp: 18   Temp: 98.2 F (36.8 C)   TempSrc: Oral   SpO2: 96%   Weight:  39.9 kg  Height:  5' 2.01" (1.575 m)    GENERAL: The patient is a well-nourished female, in no acute distress. The vital signs are documented above. The area in her forearm looks quite good with no evidence of fluctuance.  She does have exquisite tenderness and fullness in her antecubital area with possibly some fluctuance as well.  MEDICAL ISSUES: Patient has not had hemodialysis for several days related to her pain in her access site.  Discussed with Dr. Marval Regal present.  The patient will be transferred to [facility] for antibiotics and for tunneled hemodialysis catheter placement.  Our service will follow along in consultation regarding her right arm.   Rosetta Posner, MD FACS [facility] [phone]  Note: Portions of this report may have been transcribed using voice recognition software.  Every effort has been made to ensure accuracy; however, inadvertent computerized transcription errors may still be present.

## 2020-12-24 NOTE — Consult Note (Signed)
[facility] Renal Consultation Note    Indication for Consultation:  Management of ESRD/hemodialysis; anemia, hypertension/volume and secondary hyperparathyroidism  HPI: [name] is a 70 y.o. female with a PMH significant for HIV, HTN, TIA, atrial flutter, pulmonary HTN, and ESRD on HD at [facility] who presented to [facility] ED after she went to HD today and was unable to use her AVF due to edema and pain.  She had revision of her AVF on 12/01/20 by Dr. Donzetta Matters due to necrosis an infection of her right forearm AVF.  She has not had dialysis since 12/17/20 due to pain and swelling.  Dr. Donnetta Hutching kindly evaluated her in the ED and suspects ongoing infection and agrees that she will need to be transferred to [facility] for antibiotics and temp HD catheter placement and vascular surgery evaluation.  Labs are currently pending.    Past Medical History:  Diagnosis Date   Anemia    Anxiety    Atrial flutter ([place]) 02/22/2015   C. difficile colitis 10/10/11   Chronic diarrhea    Chronic pain    Depression    ESRD on hemodialysis [facility])    Dialysis [name]   GERD (gastroesophageal reflux disease)    HIV (human immunodeficiency virus infection) ([place])    Hypertension    Insomnia    Intracranial hemorrhage (HCC)    Muscle weakness (generalized)    Pulmonary HTN (HCC)    Tachycardia    TIA (transient ischemic attack)    [facility] do not have this dx   Traumatic hematoma of right upper arm 02/22/2015   Past Surgical History:  Procedure Laterality Date   A/V FISTULAGRAM N/A 04/17/2016   Procedure: A/V Fistulagram - Right Upper;  Surgeon: Waynetta Sandy, MD;  Location: [place] CV [facility];  Service: Cardiovascular;  Laterality: N/A;   A/V FISTULAGRAM Right 08/28/2019   Procedure: A/V FISTULAGRAM;  Surgeon: Waynetta Sandy, MD;  Location: [place] [facility];  Service: Cardiovascular;  Laterality: Right;   ABDOMINAL HYSTERECTOMY     BIOPSY   01/02/2020   Procedure: BIOPSY;  Surgeon: Eloise Harman, DO;  [facility]: [facility];  Service: Endoscopy;;   BIOPSY  07/11/2020   Procedure: BIOPSY;  Surgeon: Irving Copas., MD;  Location: [place];  Service: Gastroenterology;;   BIOPSY THYROID     CARPAL TUNNEL RELEASE Right 11/16/2019   Procedure: CARPAL TUNNEL RELEASE;  Surgeon: Leanora Cover, MD;  Location: [place];  Service: Orthopedics;  Laterality: Right;   COLONOSCOPY WITH PROPOFOL N/A 01/02/2020   Surgeon: Eloise Harman, DO;nonbleeding internal hemorrhoids, diverticulosis, status post biopsies of the entire colon (benign). Due for repeat in 2026.   Dialysis Shunts     previous one removed from left arm and now present one in right arm   DIALYSIS/PERMA CATHETER INSERTION Right 04/17/2016   Procedure: dialysis Catheter Insertion central veinous;  Surgeon: Waynetta Sandy, MD;  Location: [place] [facility];  Service: Cardiovascular;  Laterality: Right;   ESOPHAGOGASTRODUODENOSCOPY  12/15/2010   Rourk: erosive reflux esophagitis, MW tear, hiatal hernia (small), gastritis with no h.pylori, possibly nsaid related   ESOPHAGOGASTRODUODENOSCOPY (EGD) WITH PROPOFOL N/A 07/11/2020    Surgeon: Irving Copas., MD; normal esophagus, 4 cm hiatal hernia, gastritis biopsied (mild chronic gastritis, negative H. pylori), single duodenal polyp (Brunner's gland hyperplasia), s/p duodenal biopsy benign, duodenal narrowing at the duodenal sweep.   EUS N/A 07/11/2020    Surgeon: Irving Copas., MD;  dilated CBD and common hepatic duct without stones or sludge in the bile duct, stones and biliary sludge in the gallbladder, pancreatic parenchyma with hyperechoic strands, pancreatic duct dilated with prominent sidebranches in the neck and body and tail, no pancreatic mass though visulalization limited, no ampullary lesion, no malignant appearing lymph node   EXCISION OF BREAST BIOPSY Right 05/04/2012   Procedure:  EXCISION OF BREAST BIOPSY;  Surgeon: Donato Heinz, MD;  Location: [facility];  Service: General;  Laterality: Right;  Right Excisional Breast Biopsy   FISTULOGRAM Right 03/26/2014   Procedure: Right Arm Fistulogram with Venoplasty Right Subclavian Vein and Inominate Vein. Debridement Fistula Ulcer;  Surgeon: Conrad Kenly, MD;  Location: [place];  Service: Vascular;  Laterality: Right;   FISTULOGRAM Right 03/29/2017   Procedure: FISTULOGRAM COMPLEX RIGHT ARM with Balloon angioplasty;  Surgeon: Waynetta Sandy, MD;  Location: [place];  Service: Vascular;  Laterality: Right;   HEMOSTASIS CLIP PLACEMENT  07/11/2020   Procedure: HEMOSTASIS CLIP PLACEMENT;  Surgeon: Irving Copas., MD;  Location: [place];  Service: Gastroenterology;;   IR GENERIC HISTORICAL  05/19/2016   IR REMOVAL TUN CV CATH W/O FL 05/19/2016 Markus Daft, MD [facility]-INTERV RAD   LIGATION OF ARTERIOVENOUS  FISTULA Right 11/12/2020   Procedure: LIGATION OF ARTERIOVENOUS  FISTULA WITH EXCISION OF NECROTIC TISSUE;  Surgeon: Waynetta Sandy, MD;  Location: [place];  Service: Vascular;  Laterality: Right;   PERIPHERAL VASCULAR BALLOON ANGIOPLASTY Right 04/17/2016   Procedure: Peripheral Vascular Balloon Angioplasty;  Surgeon: Waynetta Sandy, MD;  Location: [place] [facility];  Service: Cardiovascular;  Laterality: Right;  Central segment AV Fistula   PERIPHERAL VASCULAR BALLOON ANGIOPLASTY Right 03/14/2018   Procedure: PERIPHERAL VASCULAR BALLOON ANGIOPLASTY;  Surgeon: Waynetta Sandy, MD;  Location: [place] [facility];  Service: Cardiovascular;  Laterality: Right;  subclavian vein   PERIPHERAL VASCULAR BALLOON ANGIOPLASTY Right 08/28/2019   Procedure: PERIPHERAL VASCULAR BALLOON ANGIOPLASTY;  Surgeon: Waynetta Sandy, MD;  Location: [place] [facility];  Service: Cardiovascular;  Laterality: Right;  upper arm   PERIPHERAL VASCULAR BALLOON ANGIOPLASTY Right 11/11/2020   Procedure: PERIPHERAL  VASCULAR BALLOON ANGIOPLASTY;  Surgeon: Waynetta Sandy, MD;  Location: [place] [facility];  Service: Cardiovascular;  Laterality: Right;   PERIPHERAL VASCULAR INTERVENTION Right 03/14/2018   Procedure: PERIPHERAL VASCULAR INTERVENTION;  Surgeon: Waynetta Sandy, MD;  Location: [place] [facility];  Service: Cardiovascular;  Laterality: Right;  subclavian vein   POLYPECTOMY  07/11/2020   Procedure: POLYPECTOMY;  Surgeon: Mansouraty, Telford Nab., MD;  Location: [facility] ENDOS[facility];  Service: Gastroenterology;;   REVISON OF ARTERIOVENOUS FISTULA Right 11/12/2020   Procedure: CREATION OF RIGHT ARM ARTERIOVENOUS FISTULA;  Surgeon: Waynetta Sandy, MD;  Location: [place];  Service: Vascular;  Laterality: Right;   SHUNTOGRAM Right 10/19/2013   Procedure: FISTULOGRAM;  Surgeon: Conrad [place], MD;  Location: [facility] CATH LAB;  Service: Cardiovascular;  Laterality: Right;   TONSILLECTOMY     VISCERAL VENOGRAPHY Right 03/14/2018   Procedure: CENTRAL VENOGRAPHY;  Surgeon: Waynetta Sandy, MD;  Location: [place] [facility];  Service: Cardiovascular;  Laterality: Right;  upper ext fistula   Family History:   Family History  Problem Relation Age of Onset   Anesthesia problems Neg Hx    Hypotension Neg Hx    Malignant hyperthermia Neg Hx    Pseudochol deficiency Neg Hx    Colon cancer Neg Hx        Unsure of parents who  both died when she was a baby   Social History:  reports that she quit smoking about 37 years ago. Her smoking use included cigarettes. She has never used smokeless tobacco. She reports that she does not drink alcohol and does not use drugs. Allergies  Allergen Reactions   Lactose Intolerance (Gi) Other (See Comments)    On MAR   Latex Rash and Itching   Penicillins Rash and Swelling    Has patient had a PCN reaction causing immediate rash, facial/tongue/throat swelling, SOB or lightheadedness with hypotension: No Has patient had a PCN reaction causing severe  rash involving mucus membranes or skin necrosis: No Has patient had a PCN reaction that required hospitalization No Has patient had a PCN reaction occurring within the last 10 years: No If all of the above answers are "NO", then may proceed with Cephalosporin use.     Prior to Admission medications   Medication Sig Start Date End Date Taking? Authorizing Provider  Amino Acids-Protein Hydrolys (PRO-STAT 64 PO) Take by mouth. Give 4ml by mouth in the morning for wound healing    [provider]  apixaban (ELIQUIS) 2.5 MG TABS tablet Take 1 tablet (2.5 mg total) by mouth 2 (two) times daily. 11/27/20   Ulyses Amor, PA-C  clopidogrel (PLAVIX) 75 MG tablet Take 1 tablet (75 mg total) by mouth daily with breakfast. Patient taking differently: Take 75 mg by mouth daily with breakfast. (0800) 07/14/20   Mansouraty, Telford Nab., MD  collagenase (SANTYL) ointment Apply 1 application topically daily. Apply to sacrum topically every day shift for wound healing    [provider]  cyanocobalamin (,VITAMIN B-12,) 1000 MCG/ML injection Inject 1,000 mcg into the muscle every 30 (thirty) days. On the 28th of every month    [provider]  dolutegravir (TIVICAY) 50 MG tablet Take 50 mg by mouth daily at 4 PM.    [provider]  Emollient (MINERIN) LOTN Apply 1 application topically every 12 (twelve) hours as needed (dry skin). Apply to feet and ankles    [provider]  fentaNYL (DURAGESIC) 25 MCG/HR Place 1 patch onto the skin every 3 (three) days. 11/27/20   Ulyses Amor, PA-C  lamivudine (EPIVIR) 100 MG tablet Take 50 mg by mouth daily. (0800)    [provider]  Lidocaine HCl 4 % CREA Apply 1 application topically every 12 (twelve) hours as needed (pain).    [provider]  lidocaine-prilocaine (EMLA) cream Apply 1 application topically See admin instructions. (0900) Apply to fistula site one hour before dialysis on Tuesday, Thursday, and  Saturday.    [provider]  melatonin 3 MG TABS tablet Take 3 mg by mouth at bedtime. (2000)    [provider]  methadone (DOLOPHINE) 5 MG tablet Take 0.5 tablets (2.5 mg total) by mouth daily at 8 pm. (2000) 11/27/20   Theda Sers, Susette Racer, PA-C  metoprolol tartrate (LOPRESSOR) 25 MG tablet Take 1 tablet (25 mg total) by mouth 2 (two) times daily. 11/27/20   Ulyses Amor, PA-C  mirtazapine (REMERON) 7.5 MG tablet Take 7.5 mg by mouth at bedtime. (2100)    [provider]  Multiple Vitamin (MULTIVITAMIN WITH MINERALS) TABS tablet Take 1 tablet by mouth at bedtime. (2000)    [provider]  nitroGLYCERIN (NITROSTAT) 0.4 MG SL tablet Place 0.4 mg under the tongue every 5 (five) minutes x 3 doses as needed for chest pain.     [provider]  ondansetron (ZOFRAN ODT) 8 MG disintegrating tablet Take 1 tablet (8 mg total) by mouth every 8 (eight) hours as needed for nausea or vomiting. 12/16/20   Erenest Rasher, PA-C  Oxycodone HCl 10 MG TABS Take 1 tablet (10 mg total) by mouth every 12 (twelve) hours as needed (severe pain). 11/27/20   Ulyses Amor, PA-C  pantoprazole (PROTONIX) 40 MG tablet Take 1 tablet (40 mg total) by mouth daily before breakfast. 12/16/20   Erenest Rasher, PA-C  promethazine (PHENERGAN) 12.5 MG tablet Take 12.5 mg by mouth See admin instructions. Take 1 tablet (12.5 mg) by mouth in the morning every Tuesday, Thursday and Saturday for nausea/vomiting and every 6 hours as needed for n/v    [provider]  senna (SENOKOT) 8.6 MG tablet Take 2 tablets by mouth every evening. (2100)    [provider]  venlafaxine XR (EFFEXOR-XR) 150 MG 24 hr capsule Take 150 mg by mouth daily with breakfast. (0900)    [provider]  zidovudine (RETROVIR) 100 MG capsule Take 100 mg by mouth 3 (three) times daily. (0900, 1300, & 2100)    [provider]   Current Facility-Administered Medications  Medication Dose  Route Frequency Provider Last Rate Last Admin   HYDROmorphone (DILAUDID) injection 0.25 mg  0.25 mg Intramuscular Once Marcello Fennel, PA-C       Current Outpatient Medications  Medication Sig Dispense Refill   Amino Acids-Protein Hydrolys (PRO-STAT 64 PO) Take by mouth. Give 66ml by mouth in the morning for wound healing     apixaban (ELIQUIS) 2.5 MG TABS tablet Take 1 tablet (2.5 mg total) by mouth 2 (two) times daily. 60 tablet 0   clopidogrel (PLAVIX) 75 MG tablet Take 1 tablet (75 mg total) by mouth daily with breakfast. (Patient taking differently: Take 75 mg by mouth daily with breakfast. (0800)) 30 tablet    collagenase (SANTYL) ointment Apply 1 application topically daily. Apply to sacrum topically every day shift for wound healing     cyanocobalamin (,VITAMIN B-12,) 1000 MCG/ML injection Inject 1,000 mcg into the muscle every 30 (thirty) days. On the 28th of every month     dolutegravir (TIVICAY) 50 MG tablet Take 50 mg by mouth daily at 4 PM.     Emollient (MINERIN) LOTN Apply 1 application topically every 12 (twelve) hours as needed (dry skin). Apply to feet and ankles     fentaNYL (DURAGESIC) 25 MCG/HR Place 1 patch onto the skin every 3 (three) days. 3 patch 0   lamivudine (EPIVIR) 100 MG tablet Take 50 mg by mouth daily. (0800)     Lidocaine HCl 4 % CREA Apply 1 application topically every 12 (twelve) hours as needed (pain).     lidocaine-prilocaine (EMLA) cream Apply 1 application topically See admin instructions. (0900) Apply to fistula site one hour before dialysis on Tuesday, Thursday, and Saturday.     melatonin 3 MG TABS tablet Take 3 mg by mouth at bedtime. (2000)     methadone (DOLOPHINE) 5 MG tablet Take 0.5 tablets (2.5 mg total) by mouth daily at 8 pm. (2000) 3 tablet 0   metoprolol tartrate (LOPRESSOR) 25 MG tablet Take 1 tablet (25 mg total) by mouth 2 (two) times daily. 60 tablet 0   mirtazapine (REMERON) 7.5 MG tablet Take 7.5 mg by mouth at bedtime. (2100)      Multiple Vitamin (MULTIVITAMIN WITH MINERALS) TABS tablet Take 1 tablet by mouth at bedtime. (2000)     nitroGLYCERIN (  NITROSTAT) 0.4 MG SL tablet Place 0.4 mg under the tongue every 5 (five) minutes x 3 doses as needed for chest pain.      ondansetron (ZOFRAN ODT) 8 MG disintegrating tablet Take 1 tablet (8 mg total) by mouth every 8 (eight) hours as needed for nausea or vomiting. 30 tablet 3   Oxycodone HCl 10 MG TABS Take 1 tablet (10 mg total) by mouth every 12 (twelve) hours as needed (severe pain). 4 tablet 0   pantoprazole (PROTONIX) 40 MG tablet Take 1 tablet (40 mg total) by mouth daily before breakfast. 30 tablet 3   promethazine (PHENERGAN) 12.5 MG tablet Take 12.5 mg by mouth See admin instructions. Take 1 tablet (12.5 mg) by mouth in the morning every Tuesday, Thursday and Saturday for nausea/vomiting and every 6 hours as needed for n/v     senna (SENOKOT) 8.6 MG tablet Take 2 tablets by mouth every evening. (2100)     venlafaxine XR (EFFEXOR-XR) 150 MG 24 hr capsule Take 150 mg by mouth daily with breakfast. (0900)     zidovudine (RETROVIR) 100 MG capsule Take 100 mg by mouth 3 (three) times daily. (0900, 1300, & 2100)     Facility-Administered Medications Ordered in Other Encounters  Medication Dose Route Frequency Provider Last Rate Last Admin   0.9 %  sodium chloride infusion (Manually program via Guardrails IV Fluids)  250 mL Intravenous Once Lockamy, Randi L, NP-C       acetaminophen (TYLENOL) tablet 650 mg  650 mg Oral Once Lockamy, Randi L, NP-C       diphenhydrAMINE (BENADRYL) capsule 25 mg  25 mg Oral Once Lockamy, Randi L, NP-C       sodium chloride flush (NS) 0.9 % injection 10 mL  10 mL Intracatheter PRN Lockamy, Randi L, NP-C       Labs: Basic Metabolic Panel: No results for input(s): NA, K, CL, CO2, GLUCOSE, BUN, CREATININE, CALCIUM, PHOS in the last 168 hours.  Invalid input(s): ALB Liver Function Tests: No results for input(s): AST, ALT, ALKPHOS, BILITOT, PROT,  ALBUMIN in the last 168 hours. No results for input(s): LIPASE, AMYLASE in the last 168 hours. No results for input(s): AMMONIA in the last 168 hours. CBC: Recent Labs  Lab 12/24/20 1518  WBC 9.2  NEUTROABS 7.6  HGB 7.8*  HCT 25.7*  MCV 109.8*  PLT 205   Cardiac Enzymes: No results for input(s): CKTOTAL, CKMB, CKMBINDEX, TROPONINI in the last 168 hours. CBG: No results for input(s): GLUCAP in the last 168 hours. Iron Studies: No results for input(s): IRON, TIBC, TRANSFERRIN, FERRITIN in the last 72 hours. Studies/Results: No results found.  ROS: Pertinent items are noted in HPI. Physical Exam: Vitals:   12/24/20 1233 12/24/20 1234  BP: 124/68   Pulse: 75   Resp: 18   Temp: 98.2 F (36.8 C)   TempSrc: Oral   SpO2: 96%   Weight:  39.9 kg  Height:  5' 2.01" (1.575 m)      Weight change:  No intake or output data in the 24 hours ending 12/24/20 1548 BP 124/68 (BP Location: Left Leg)   Pulse 75   Temp 98.2 F (36.8 C) (Oral)   Resp 18   Ht 5' 2.01" (1.575 m)   Wt 39.9 kg   LMP  (LMP Unknown)   SpO2 96%   BMI 16.09 kg/m  General appearance: cooperative and mild distress Head: Normocephalic, without obvious abnormality, atraumatic Resp: clear to auscultation bilaterally Cardio: regular rate  and rhythm, S1, S2 normal, no murmur, click, rub or gallop GI: soft, non-tender; bowel sounds normal; no masses,  no organomegaly Extremities: RUE AVF +T/B in upper arm, forearm with ulceration, induration, tenderness, erythema, and an area of fluctuance in antecubital area.  Dialysis Access:  Dialysis Orders: Center: [facility]  on TTS . EDW 41.5kg HD Bath 2K/2.5Ca  Time 3 hours Heparin 1000 units bolus then 500 units/hr. 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  Agree with Dr. Donnetta Hutching  regarding transfer to [facility] for IV antibiotics, temp HD catheter placement, and vascular surgery evaluation.  ESRD -  Awaiting labs and placement of HD catheter prior to HD hopefully later today.  Hypertension/volume  - stable  Anemia  - continue with ESA.  Transfuse with HD.  Metabolic bone disease -  continue with home meds.  Nutrition - renal diet.  Donetta Potts, MD [place] Pager [phone] 12/24/2020, 3:48 PM

## 2020-12-24 NOTE — Progress Notes (Signed)
Pharmacy Antibiotic Note  [name] is a 70 y.o. female admitted on 12/24/2020 with wound infection.  Pharmacy has been consulted for Vancomycin and Cefepime dosing.  Plan: Vancomycin 1gm x 1 Cefepime 2gm x 1 F/U plans / schedule for dialysis before additional doses ordered  Height: 5' 2.01" (157.5 cm) Weight: 39.9 kg (88 lb) IBW/kg (Calculated) : 50.12  Temp (24hrs), Avg:98.2 F (36.8 C), Min:98.2 F (36.8 C), Max:98.2 F (36.8 C)  Recent Labs  Lab 12/24/20 1518 12/24/20 1658  WBC 9.2  --   CREATININE 10.98*  --   LATICACIDVEN 1.6 1.0    Estimated Creatinine Clearance: 3.1 mL/min (A) (by C-G formula based on SCr of 10.98 mg/dL (H)).    Allergies  Allergen Reactions   Lactose Intolerance (Gi) Other (See Comments)    On MAR   Latex Rash and Itching   Penicillins Rash and Swelling    Has patient had a PCN reaction causing immediate rash, facial/tongue/throat swelling, SOB or lightheadedness with hypotension: No Has patient had a PCN reaction causing severe rash involving mucus membranes or skin necrosis: No Has patient had a PCN reaction that required hospitalization No Has patient had a PCN reaction occurring within the last 10 years: No If all of the above answers are "NO", then may proceed with Cephalosporin use.      Antimicrobials this admission: Vanco 10/25 >>  Cefepime 10/25 >>   Dose adjustments this admission:   Microbiology results:  BCx: pending  UCx: pending   Sputum: pending   MRSA PCR: pending  Thank you for allowing pharmacy to be a part of this patient's care.  Hart Robinsons A 12/24/2020 5:22 PM

## 2020-12-24 NOTE — ED Triage Notes (Signed)
Pt arrives to ED via [facility] from [place], pt states new shunt placed in R arm on Sep 12, pain since surgery, extreme pain today that radiated down R side of body. Last dialysis treatment Thursday. A&Ox4 during triage.

## 2020-12-24 NOTE — ED Provider Notes (Signed)
[facility] EMERGENCY DEPARTMENT Provider Note   CSN: [id number] Arrival date & time: 12/24/20  1207     History Chief Complaint  Patient presents with   Post-op Problem    [name] is a 70 y.o. female.  HPI  Patient with significant medical history of a flutter currently on Eliquis, end-stage renal disease currently on dialysis Tuesday Thursday Saturday HIV, TIA presents to the emergency department with chief complaint of left infection.  Patient states she had a AV graft placed on September 12 by Dr. Donzetta Matters she states since then she has been having worsening pain and swelling that arm.  She states over the last week the swelling in her arm has gotten a lot worse and now she is having pain in her arm.  She states its warm to the touch, and she will location of paresthesias in her hand.  She states she is able to move her fingers wrist and elbow without difficulty.  She states that she went to dialysis today but they turned her away as they are concerned for possible infection.  She states the last time she had her dialysis treatment was on Thursday she missed that Saturday because she was not feeling well.  She does not endorse chest pain, shortness of breath, orthopnea, worsening pedal edema, she denies dyspnea on exertion.  She has no other complaints at this time.  Past Medical History:  Diagnosis Date   Anemia    Anxiety    Atrial flutter ([place]) 02/22/2015   C. difficile colitis 10/10/11   Chronic diarrhea    Chronic pain    Depression    ESRD on hemodialysis [facility])    Dialysis [name]   GERD (gastroesophageal reflux disease)    HIV (human immunodeficiency virus infection) ([place])    Hypertension    Insomnia    Intracranial hemorrhage (HCC)    Muscle weakness (generalized)    Pulmonary HTN ([place])    Tachycardia    TIA (transient ischemic attack)    [place] do not have this dx   Traumatic hematoma of right upper arm 02/22/2015    Patient  Active Problem List   Diagnosis Date Noted   Infected fluid collection with fistula 12/24/2020   Dysphagia 12/16/2020   Loss of weight 12/16/2020   Abdominal pain, epigastric 12/16/2020   Protein-calorie malnutrition, severe 11/20/2020   Chest pain of uncertain etiology    Altered mental status    ESRD (end stage renal disease) ([place]) 11/11/2020   Pancreatic abnormality 08/05/2020   Gastritis without bleeding 08/05/2020   Dilated pancreatic duct 05/15/2020   Abnormal MRI of abdomen 05/15/2020   Common bile duct dilation    Lobar pneumonia ([place]) 07/20/2019   Acute respiratory failure with hypoxia ([place]) 07/20/2019   Colitis 07/19/2019   Dilation of common bile duct 07/19/2019   Ventral hernia without obstruction or gangrene    Hypocalcemia 03/07/2019   Other pancytopenia ([place]) 12/08/2018   Anemia of chronic disease 04/17/2016   Hepatitis B immune 01/28/2016   Atrial flutter ([place]) 02/22/2015   Hyperkalemia 02/20/2015   Venous stenosis of right upper extremity 04/06/2014   ESRD on dialysis ([place]) 04/06/2014   Essential hypertension    Nausea and vomiting 12/15/2012   CHF, chronic ([place]) 11/25/2012   Atrial fibrillation ([place]) 05/02/2012   Intracranial bleed ([place]) 12/28/2011   Angioedema of lips 09/21/2011   Cellulitis of breast 03/09/2011   Erosive esophagitis 12/15/2010   Mallory - Mariel Kansky tear  12/15/2010   UGI bleed 12/13/2010   DIARRHEA 04/12/2009   Human immunodeficiency virus (HIV) disease ([place]) 11/15/2008   PANCREATITIS, HX OF 11/15/2008   TOBACCO USE, QUIT 11/15/2008   PAIN IN JOINT, UPPER ARM 08/07/2008   ABDOMINAL WALL HERNIA 04/26/2007   BRONCHITIS, ACUTE 11/16/2006   HYPOKALEMIA, HX OF 07/13/2006   DEPRESSION 03/23/2006   Essential hypertension, malignant 03/23/2006   Gastroesophageal reflux disease 03/23/2006   PANCREATITIS 03/23/2006   MASS, RIGHT AXILLA 03/23/2006   HEADACHE 03/23/2006   SYMPTOM, ENLARGEMENT, LYMPH NODES 03/23/2006   SHINGLES, HX OF 03/23/2006    HYSTERECTOMY, TOTAL, HX OF 03/23/2006    Past Surgical History:  Procedure Laterality Date   A/V FISTULAGRAM N/A 04/17/2016   Procedure: A/V Fistulagram - Right Upper;  Surgeon: Waynetta Sandy, MD;  Location: [place] [facility];  Service: Cardiovascular;  Laterality: N/A;   A/V FISTULAGRAM Right 08/28/2019   Procedure: A/V FISTULAGRAM;  Surgeon: Waynetta Sandy, MD;  Location: [place] [facility];  Service: Cardiovascular;  Laterality: Right;   ABDOMINAL HYSTERECTOMY     BIOPSY  01/02/2020   Procedure: BIOPSY;  Surgeon: Eloise Harman, DO;  Location: [facility];  Service: Endoscopy;;   BIOPSY  07/11/2020   Procedure: BIOPSY;  Surgeon: Irving Copas., MD;  Location: [place];  Service: Gastroenterology;;   BIOPSY THYROID     CARPAL TUNNEL RELEASE Right 11/16/2019   Procedure: CARPAL TUNNEL RELEASE;  Surgeon: Leanora Cover, MD;  Location: [place];  Service: Orthopedics;  Laterality: Right;   COLONOSCOPY WITH PROPOFOL N/A 01/02/2020   Surgeon: Eloise Harman, DO;nonbleeding internal hemorrhoids, diverticulosis, status post biopsies of the entire colon (benign). Due for repeat in 2026.   Dialysis Shunts     previous one removed from left arm and now present one in right arm   DIALYSIS/PERMA CATHETER INSERTION Right 04/17/2016   Procedure: dialysis Catheter Insertion central veinous;  Surgeon: Waynetta Sandy, MD;  Location: [place] [facility];  Service: Cardiovascular;  Laterality: Right;   ESOPHAGOGASTRODUODENOSCOPY  12/15/2010   [place]ourk: erosive reflux esophagitis, MW tear, hiatal hernia (small), gastritis with no h.pylori, possibly nsaid related   ESOPHAGOGASTRODUODENOSCOPY (EGD) WITH PROPOFOL N/A 07/11/2020    Surgeon: Irving Copas., MD; normal esophagus, 4 cm hiatal hernia, gastritis biopsied (mild chronic gastritis, negative H. pylori), single duodenal polyp (Brunner's gland hyperplasia), s/p duodenal biopsy benign, duodenal  narrowing at the duodenal sweep.   EUS N/A 07/11/2020    Surgeon: Irving Copas., MD;  dilated CBD and common hepatic duct without stones or sludge in the bile duct, stones and biliary sludge in the gallbladder, pancreatic parenchyma with hyperechoic strands, pancreatic duct dilated with prominent sidebranches in the neck and body and tail, no pancreatic mass though visulalization limited, no ampullary lesion, no malignant appearing lymph node   EXCISION OF BREAST BIOPSY Right 05/04/2012   Procedure: EXCISION OF BREAST BIOPSY;  Surgeon: Donato Heinz, MD;  Location: [facility];  Service: General;  Laterality: Right;  Right Excisional Breast Biopsy   FISTULOGRAM Right 03/26/2014   Procedure: Right Arm Fistulogram with Venoplasty Right Subclavian Vein and Inominate Vein. Debridement Fistula Ulcer;  Surgeon: Conrad [place], MD;  Location: [place];  Service: Vascular;  Laterality: Right;   FISTULOGRAM Right 03/29/2017   Procedure: FISTULOGRAM COMPLEX RIGHT ARM with Balloon angioplasty;  Surgeon: Waynetta Sandy, MD;  Location: [place];  Service: Vascular;  Laterality: Right;   HEMOSTASIS CLIP PLACEMENT  07/11/2020   Procedure: HEMOSTASIS  CLIP PLACEMENT;  Surgeon: Mansouraty, Telford Nab., MD;  Location: [place];  Service: Gastroenterology;;   IR GENERIC HISTORICAL  05/19/2016   IR REMOVAL TUN CV CATH W/O FL 05/19/2016 Markus Daft, MD [facility]-INTER[facility] RAD   LIGATION OF ARTERIOVENOUS  FISTULA Right 11/12/2020   Procedure: LIGATION OF ARTERIOVENOUS  FISTULA WITH EXCISION OF NECROTIC TISSUE;  Surgeon: Waynetta Sandy, MD;  Location: [place];  Service: Vascular;  Laterality: Right;   PERIPHERAL VASCULAR BALLOON ANGIOPLASTY Right 04/17/2016   Procedure: Peripheral Vascular Balloon Angioplasty;  Surgeon: Waynetta Sandy, MD;  Location: [place] [facility];  Service: Cardiovascular;  Laterality: Right;  Central segment AV Fistula   PERIPHERAL VASCULAR BALLOON ANGIOPLASTY Right 03/14/2018    Procedure: PERIPHERAL VASCULAR BALLOON ANGIOPLASTY;  Surgeon: Waynetta Sandy, MD;  Location: [place] [facility];  Service: Cardiovascular;  Laterality: Right;  subclavian vein   PERIPHERAL VASCULAR BALLOON ANGIOPLASTY Right 08/28/2019   Procedure: PERIPHERAL VASCULAR BALLOON ANGIOPLASTY;  Surgeon: Waynetta Sandy, MD;  Location: [place] [facility];  Service: Cardiovascular;  Laterality: Right;  upper arm   PERIPHERAL VASCULAR BALLOON ANGIOPLASTY Right 11/11/2020   Procedure: PERIPHERAL VASCULAR BALLOON ANGIOPLASTY;  Surgeon: Waynetta Sandy, MD;  Location: [place] [facility];  Service: Cardiovascular;  Laterality: Right;   PERIPHERAL VASCULAR INTERVENTION Right 03/14/2018   Procedure: PERIPHERAL VASCULAR INTERVENTION;  Surgeon: Waynetta Sandy, MD;  Location: [place] [facility];  Service: Cardiovascular;  Laterality: Right;  subclavian vein   POLYPECTOMY  07/11/2020   Procedure: POLYPECTOMY;  Surgeon: Mansouraty, Telford Nab., MD;  Location: [facility] ENDOSCOPY;  Service: Gastroenterology;;   REVISON OF ARTERIOVENOUS FISTULA Right 11/12/2020   Procedure: CREATION OF RIGHT ARM ARTERIOVENOUS FISTULA;  Surgeon: Waynetta Sandy, MD;  Location: [place];  Service: Vascular;  Laterality: Right;   SHUNTOGRAM Right 10/19/2013   Procedure: FISTULOGRAM;  Surgeon: Conrad Russell, MD;  Location: [facility] CATH LAB;  Service: Cardiovascular;  Laterality: Right;   TONSILLECTOMY     VISCERAL VENOGRAPHY Right 03/14/2018   Procedure: CENTRAL VENOGRAPHY;  Surgeon: Waynetta Sandy, MD;  Location: [place] [facility];  Service: Cardiovascular;  Laterality: Right;  upper ext fistula     OB History     Gravida  1   Para  1   Term  1   Preterm      AB      Living  1      SAB      IAB      Ectopic      Multiple      Live Births              Family History  Problem Relation Age of Onset   Anesthesia problems Neg Hx    Hypotension Neg Hx    Malignant  hyperthermia Neg Hx    Pseudochol deficiency Neg Hx    Colon cancer Neg Hx        Unsure of parents who both died when she was a baby    Social History   Tobacco Use   Smoking status: Former    Types: Cigarettes    Quit date: 03/12/1983    Years since quitting: 37.8   Smokeless tobacco: Never  Vaping Use   Vaping Use: Never used  Substance Use Topics   Alcohol use: No    Alcohol/week: 0.0 standard drinks   Drug use: No    Home Medications Prior to Admission medications   Medication Sig Start Date End Date  Taking? Authorizing Provider  Amino Acids-Protein Hydrolys (PRO-STAT 64 PO) Take by mouth. Give 45ml by mouth in the morning for wound healing    [provider]  apixaban (ELIQUIS) 2.5 MG TABS tablet Take 1 tablet (2.5 mg total) by mouth 2 (two) times daily. 11/27/20   Ulyses Amor, PA-C  clopidogrel (PLAVIX) 75 MG tablet Take 1 tablet (75 mg total) by mouth daily with breakfast. Patient taking differently: Take 75 mg by mouth daily with breakfast. (0800) 07/14/20   Mansouraty, Telford Nab., MD  collagenase (SANTYL) ointment Apply 1 application topically daily. Apply to sacrum topically every day shift for wound healing    [provider]  cyanocobalamin (,VITAMIN B-12,) 1000 MCG/ML injection Inject 1,000 mcg into the muscle every 30 (thirty) days. On the 28th of every month    [provider]  dolutegravir (TIVICAY) 50 MG tablet Take 50 mg by mouth daily at 4 PM.    [provider]  Emollient (MINERIN) LOTN Apply 1 application topically every 12 (twelve) hours as needed (dry skin). Apply to feet and ankles    [provider]  fentaNYL (DURAGESIC) 25 MCG/HR Place 1 patch onto the skin every 3 (three) days. 11/27/20   Ulyses Amor, PA-C  lamivudine (EPIVIR) 100 MG tablet Take 50 mg by mouth daily. (0800)    [provider]  Lidocaine HCl 4 % CREA Apply 1 application topically every 12 (twelve) hours as needed (pain).     [provider]  lidocaine-prilocaine (EMLA) cream Apply 1 application topically See admin instructions. (0900) Apply to fistula site one hour before dialysis on Tuesday, Thursday, and Saturday.    [provider]  melatonin 3 MG TABS tablet Take 3 mg by mouth at bedtime. (2000)    [provider]  methadone (DOLOPHINE) 5 MG tablet Take 0.5 tablets (2.5 mg total) by mouth daily at 8 pm. (2000) 11/27/20   Theda Sers, Susette Racer, PA-C  metoprolol tartrate (LOPRESSOR) 25 MG tablet Take 1 tablet (25 mg total) by mouth 2 (two) times daily. 11/27/20   Ulyses Amor, PA-C  mirtazapine (REMERON) 15 MG tablet Take 15 mg by mouth at bedtime. 12/05/20   [provider]  mirtazapine (REMERON) 7.5 MG tablet Take 7.5 mg by mouth at bedtime. (2100)    [provider]  Multiple Vitamin (MULTIVITAMIN WITH MINERALS) TABS tablet Take 1 tablet by mouth at bedtime. (2000)    [provider]  nitroGLYCERIN (NITROSTAT) 0.4 MG SL tablet Place 0.4 mg under the tongue every 5 (five) minutes x 3 doses as needed for chest pain.     [provider]  ondansetron (ZOFRAN ODT) 8 MG disintegrating tablet Take 1 tablet (8 mg total) by mouth every 8 (eight) hours as needed for nausea or vomiting. 12/16/20   Erenest Rasher, PA-C  ondansetron (ZOFRAN) 8 MG tablet Take 8 mg by mouth 3 (three) times daily. 12/16/20   [provider]  Oxycodone HCl 10 MG TABS Take 1 tablet (10 mg total) by mouth every 12 (twelve) hours as needed (severe pain). 11/27/20   Ulyses Amor, PA-C  pantoprazole (PROTONIX) 40 MG tablet Take 1 tablet (40 mg total) by mouth daily before breakfast. 12/16/20   Erenest Rasher, PA-C  promethazine (PHENERGAN) 12.5 MG tablet Take 12.5 mg by mouth See admin instructions. Take 1 tablet (12.5 mg) by mouth in the morning every Tuesday, Thursday and Saturday for nausea/vomiting and every 6 hours as needed for n/v  [provider]  senna (SENOKOT)  8.6 MG tablet Take 2 tablets by mouth every evening. (2100)    [provider]  venlafaxine XR (EFFEXOR-XR) 150 MG 24 hr capsule Take 150 mg by mouth daily with breakfast. (0900)    [provider]  zidovudine (RETROVIR) 100 MG capsule Take 100 mg by mouth 3 (three) times daily. (0900, 1300, & 2100)    [provider]    Allergies    Lactose intolerance (gi), Latex, and Penicillins  Review of Systems   Review of Systems  Constitutional:  Negative for chills and fever.  HENT:  Negative for congestion.   Respiratory:  Negative for shortness of breath.   Cardiovascular:  Negative for chest pain.  Gastrointestinal:  Negative for abdominal pain, diarrhea, nausea and vomiting.  Genitourinary:  Negative for enuresis.  Musculoskeletal:  Negative for back pain.       Right arm pain and swelling.  Skin:  Negative for rash.  Neurological:  Negative for dizziness.  Hematological:  Does not bruise/bleed easily.   Physical Exam Updated Vital Signs BP (!) 169/72   Pulse 85   Temp 100.3 F (37.9 C) (Oral)   Resp 20   Ht 5' 2.01" (1.575 m)   Wt 39.9 kg   LMP  (LMP Unknown)   SpO2 99%   BMI 16.09 kg/m   Physical Exam Vitals and nursing note reviewed.  Constitutional:      General: She is not in acute distress.    Appearance: She is not ill-appearing.  HENT:     Head: Normocephalic and atraumatic.     Nose: No congestion.     Mouth/Throat:     Mouth: Mucous membranes are moist.     Pharynx: Oropharynx is clear.  Eyes:     Conjunctiva/sclera: Conjunctivae normal.  Cardiovascular:     Rate and Rhythm: Normal rate and regular rhythm.     Pulses: Normal pulses.     Heart sounds: No murmur heard.   No friction rub. No gallop.  Pulmonary:     Effort: No respiratory distress.     Breath sounds: No wheezing, rhonchi or rales.  Abdominal:     Palpations: Abdomen is soft.     Tenderness: There is no abdominal tenderness. There is no right CVA tenderness or  left CVA tenderness.  Musculoskeletal:     Right lower leg: No edema.     Left lower leg: No edema.     Comments: Full range of motion in all 4 extremities, 5 of 5 strength, neurovascular fully intact.  Skin:    General: Skin is warm and dry.     Comments: Right arm is visualized edematous, no pitting edema, warm to the touch, she has AV fistula with no surrounding erythema, good palpable thrill, very tender to palpation.  No fluctuant or induration present. she also has a healing wound on her right forearm no signs of infection present, no erythema  no drainage or discharge, no fluctuance or induration present.  Patient's hand was cool to the touch, no ischemic changes present, difficult radial pulse, good capillary refill.   Neurological:     Mental Status: She is alert.  Psychiatric:        Mood and Affect: Mood normal.    ED Results / Procedures / Treatments   Labs (all labs ordered are listed, but only abnormal results are displayed) Labs Reviewed  COMPREHENSIVE METABOLIC PANEL - Abnormal; Notable for the following components:  Result Value   Sodium 132 (*)    Potassium 5.3 (*)    Chloride 92 (*)    Glucose, Bld 68 (*)    BUN 87 (*)    Creatinine, Ser 10.98 (*)    Albumin 2.9 (*)    AST 12 (*)    GFR, Estimated 3 (*)    Anion gap 17 (*)    All other components within normal limits  CBC WITH DIFFERENTIAL/PLATELET - Abnormal; Notable for the following components:   RBC 2.34 (*)    Hemoglobin 7.8 (*)    HCT 25.7 (*)    MCV 109.8 (*)    RDW 17.0 (*)    All other components within normal limits  CBG MONITORING, ED - Abnormal; Notable for the following components:   Glucose-Capillary 57 (*)    All other components within normal limits  CULTURE, BLOOD (ROUTINE X 2)  CULTURE, BLOOD (ROUTINE X 2)  RESP PANEL BY RT-PCR (FLU A&B, COVID) ARPGX2  LACTIC ACID, PLASMA  LACTIC ACID, PLASMA  MAGNESIUM  COMPREHENSIVE METABOLIC PANEL  CBC WITH DIFFERENTIAL/PLATELET     EKG None  Radiology DG Chest Port 1 View  Result Date: 12/24/2020 CLINICAL DATA:  Recent shunt placement, pain EXAM: PORTABLE CHEST 1 VIEW COMPARISON:  04/06/2020 FINDINGS: Gross cardiomegaly and pulmonary vascular prominence. No acute appearing airspace opacity. Right brachiocephalic venous stent. Surgical clips in the right axilla. IMPRESSION: Gross cardiomegaly and pulmonary vascular prominence. No overt edema or acute appearing airspace opacity. Electronically Signed   By: Delanna Ahmadi M.D.   On: 12/24/2020 16:11    Procedures Procedures   Medications Ordered in ED Medications  dextrose 50 % solution 50 mL (has no administration in time range)  ceFEPIme (MAXIPIME) 2 g in sodium chloride 0.9 % 100 mL IVPB (2 g Intravenous New Bag/Given 12/24/20 1727)  vancomycin (VANCOCIN) IVPB 1000 mg/200 mL premix (has no administration in time range)  HYDROmorphone (DILAUDID) injection 0.5-1 mg (has no administration in time range)  ondansetron (ZOFRAN) tablet 4 mg (has no administration in time range)    Or  ondansetron (ZOFRAN) injection 4 mg (has no administration in time range)  HYDROmorphone (DILAUDID) injection 0.25 mg (0.25 mg Intramuscular Given 12/24/20 1549)  sodium zirconium cyclosilicate (LOKELMA) packet 10 g (10 g Oral Given 12/24/20 1623)    ED Course  I have reviewed the triage vital signs and the nursing notes.  Pertinent labs & imaging results that were available during my care of the patient were reviewed by me and considered in my medical decision making (see chart for details).    MDM Rules/Calculators/A&P                          Initial impression-patient presents with concerns of postop infection.  She is alert, does not appear acute stress, vital signs are reassuring.  I am concerned for possible infection of the graft will obtain sepsis lab work-up and reassess.  Work-up-CBC shows macrocytic anemia hemoglobin of 7.8 appears to be below her normal baseline of  9, CMP shows symmetry of 132, potassium 5.3, chloride 92, glucose 68, BUN of 87, creatinine 10.98, albumin 2.9, AST 12, GFR 3, anion gap 17, lactic is 1.6, magnesium 2.2, respiratory panel is negative.  Chest x-ray reveals gross cardiomegaly and pulmonary vascular prominence no overt edema or acute appearing airspace opacities.  Reassessment-lab work and imaging appear to be unremarkable.  Due to enlargement of patient's right arm I am concerned  for possible infection will speak with vascular surgery for further recommendations.  It is noted that patient has a slightly elevated potassium we will give her Lokelma, she also has a glucose of 68 on her CMP we will give her an amp of D50 and continue to monitor her sugars.   Consult-spoke with Dr. Nechama Guard who recommends hospital admission and transfer down to [facility] as we do not have vascular surgery here at [facility] and will need access for dialysis.  will hold Eliquis and treat for infection if concern is present.  Dr. Ernestina Patches will admit the patient, will stop Eliquis, recommend starting patient on broad-spectrum antibiotics will start on vancomycin and cefepime.  Rule out-I have low suspicion for systemic infection as patient is nontoxic-appearing, vital signs reassuring, no leukocytosis present on CBC.  I have low suspicion for limb ischemia as neurovascular is fully intact in her right lower arm, she still has a good radial pulse, good capillary refill, sensation is fully intact.  I have low suspicion for emergent dialysis as patient does not appear to be volume overload at this time, no significant electrolyte derailments, she is currently not uremic not altered at this time.    Plan-admission for possible right arm graft infection.  Transfer to [facility] for evaluation by vascular surgery  Final Clinical Impression(s) / ED Diagnoses Final diagnoses:  Dialysis complication, initial encounter    Rx / DC Orders ED Discharge Orders      None        Marcello Fennel, PA-C 12/24/20 1736    Horton, Alvin Critchley, DO 12/25/20 0732

## 2020-12-24 NOTE — H&P (Addendum)
History and Physical    [name] PPI:[id number] DOB: 1950-11-11 DOA: 12/24/2020  Referring MD/NP/PA: Ileene Patrick MD  PCP: Hilbert Corrigan, MD  Outpatient Specialists: nephrology Patient coming from: HD clinic  Chief Complaint: Infected RUE fistula   HPI: [name] is a 70 y.o. female with medical history significant of end-stage renal disease on dialysis Tuesday Thursday Saturday, HIV, GERD, TIA, atrial flutter on Eliquis, right upper extremity fistula presented with infected right extremity fistula.  Patient reports worsening right upper extremity redness swelling and pain over the past 5 to 6 days.  Usually gets hemodialysis on a schedule Tuesday Thursday Saturday.  Patient has been unable to get her dialysis over the past 2 sessions secondary to redness pain and swelling.  Patient attempted to go to the dialysis clinic today and they redirected her to the ER given concern for active infection.  Positive worsening pain.  No fevers or chills.  Minimal to mild shortness of breath.  No nausea or vomiting.  Patient noted to have been recently in the hospital September 12 through September 28 for creation of right upper extremity AV fistula with noted excision of necrotic tissue in the area.  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  Op note shows an innominate stent that was subtotally occluded.  Patient had regular and drug-coated balloon angioplasty done to restore flow.  Patient is unclear of her medications she takes every day but does report compliance.  Noted to be on Eliquis in the setting of atrial flutter during recent hospitalization.  Patient also reports compliance with her home HIV medications. Presented to the ER afebrile, hemodynamically stable.  Labs notable for hemoglobin of 7.8, white count 9.2.  COVID-negative.  Potassium 5.3, creatinine 10.99.  Lactate 1.6.  Both vascular surgery and nephrology were consulted on the patient with  recommendation of patient to be transferred to [facility] for placement of tunneled HD catheter as well as urgent dialysis.  To be started on broad-spectrum antibiotics in the ER.  Review of Systems: As per HPI otherwise 10 point review of systems negative.   Past Medical History:  Diagnosis Date   Anemia    Anxiety    Atrial flutter ([place]) 02/22/2015   C. difficile colitis 10/10/11   Chronic diarrhea    Chronic pain    Depression    ESRD on hemodialysis [facility])    Dialysis [name]   GERD (gastroesophageal reflux disease)    HIV (human immunodeficiency virus infection) ([place])    Hypertension    Insomnia    Intracranial hemorrhage (HCC)    Muscle weakness (generalized)    Pulmonary HTN (HCC)    Tachycardia    TIA (transient ischemic attack)    [facility] do not have this dx   Traumatic hematoma of right upper arm 02/22/2015    Past Surgical History:  Procedure Laterality Date   A/V FISTULAGRAM N/A 04/17/2016   Procedure: A/V Fistulagram - Right Upper;  Surgeon: Waynetta Sandy, MD;  Location: [place] CV LAB;  Service: Cardiovascular;  Laterality: N/A;   A/V FISTULAGRAM Right 08/28/2019   Procedure: A/V FISTULAGRAM;  Surgeon: Waynetta Sandy, MD;  Location: [place] CV LAB;  Service: Cardiovascular;  Laterality: Right;   ABDOMINAL HYSTERECTOMY     BIOPSY  01/02/2020   Procedure: BIOPSY;  Surgeon: Eloise Harman, DO;  [facility];  Service: Endoscopy;;   BIOPSY  07/11/2020   Procedure: BIOPSY;  Surgeon: Irving Copas., MD;  Location: [place];  Service: Gastroenterology;;   BIOPSY THYROID     CARPAL TUNNEL RELEASE Right 11/16/2019   Procedure: CARPAL TUNNEL RELEASE;  Surgeon: Leanora Cover, MD;  Location: [place];  Service: Orthopedics;  Laterality: Right;   COLONOSCOPY WITH PROPOFOL N/A 01/02/2020   Surgeon: Eloise Harman, DO;nonbleeding internal hemorrhoids, diverticulosis, status post biopsies  of the entire colon (benign). Due for repeat in 2026.   Dialysis Shunts     previous one removed from left arm and now present one in right arm   DIALYSIS/PERMA CATHETER INSERTION Right 04/17/2016   Procedure: dialysis Catheter Insertion central veinous;  Surgeon: Waynetta Sandy, MD;  Location: [place] CV LAB;  Service: Cardiovascular;  Laterality: Right;   ESOPHAGOGASTRODUODENOSCOPY  12/15/2010   [place]: erosive reflux esophagitis, MW tear, hiatal hernia (small), gastritis with no h.pylori, possibly nsaid related   ESOPHAGOGASTRODUODENOSCOPY (EGD) WITH PROPOFOL N/A 07/11/2020    Surgeon: Irving Copas., MD; normal esophagus, 4 cm hiatal hernia, gastritis biopsied (mild chronic gastritis, negative H. pylori), single duodenal polyp (Brunner's gland hyperplasia), s/p duodenal biopsy benign, duodenal narrowing at the duodenal sweep.   EUS N/A 07/11/2020    Surgeon: Irving Copas., MD;  dilated CBD and common hepatic duct without stones or sludge in the bile duct, stones and biliary sludge in the gallbladder, pancreatic parenchyma with hyperechoic strands, pancreatic duct dilated with prominent sidebranches in the neck and body and tail, no pancreatic mass though visulalization limited, no ampullary lesion, no malignant appearing lymph node   EXCISION OF BREAST BIOPSY Right 05/04/2012   Procedure: EXCISION OF BREAST BIOPSY;  Surgeon: Donato Heinz, MD;  Location: [facility] OR[facility];  Service: General;  Laterality: Right;  Right Excisional Breast Biopsy   FISTULOGRAM Right 03/26/2014   Procedure: Right Arm Fistulogram with Venoplasty Right Subclavian Vein and Inominate Vein. Debridement Fistula Ulcer;  Surgeon: Conrad Mayer, MD;  Location: [place];  Service: Vascular;  Laterality: Right;   FISTULOGRAM Right 03/29/2017   Procedure: FISTULOGRAM COMPLEX RIGHT ARM with Balloon angioplasty;  Surgeon: Waynetta Sandy, MD;  Location: [place];  Service: Vascular;  Laterality: Right;    HEMOSTASIS CLIP PLACEMENT  07/11/2020   Procedure: HEMOSTASIS CLIP PLACEMENT;  Surgeon: Irving Copas., MD;  Location: [place];  Service: Gastroenterology;;   IR GENERIC HISTORICAL  05/19/2016   IR REMOVAL TUN CV CATH W/O FL 05/19/2016 Markus Daft, MD [facility]-INTERV RAD   LIGATION OF ARTERIOVENOUS  FISTULA Right 11/12/2020   Procedure: LIGATION OF ARTERIOVENOUS  FISTULA WITH EXCISION OF NECROTIC TISSUE;  Surgeon: Waynetta Sandy, MD;  Location: [place];  Service: Vascular;  Laterality: Right;   PERIPHERAL VASCULAR BALLOON ANGIOPLASTY Right 04/17/2016   Procedure: Peripheral Vascular Balloon Angioplasty;  Surgeon: Waynetta Sandy, MD;  Location: [place] CV LAB;  Service: Cardiovascular;  Laterality: Right;  Central segment AV Fistula   PERIPHERAL VASCULAR BALLOON ANGIOPLASTY Right 03/14/2018   Procedure: PERIPHERAL VASCULAR BALLOON ANGIOPLASTY;  Surgeon: Waynetta Sandy, MD;  Location: [place] CV LAB;  Service: Cardiovascular;  Laterality: Right;  subclavian vein   PERIPHERAL VASCULAR BALLOON ANGIOPLASTY Right 08/28/2019   Procedure: PERIPHERAL VASCULAR BALLOON ANGIOPLASTY;  Surgeon: Waynetta Sandy, MD;  Location: [place] CV LAB;  Service: Cardiovascular;  Laterality: Right;  upper arm   PERIPHERAL VASCULAR BALLOON ANGIOPLASTY Right 11/11/2020   Procedure: PERIPHERAL VASCULAR BALLOON ANGIOPLASTY;  Surgeon: Waynetta Sandy, MD;  Location: [place] CV LAB;  Service: Cardiovascular;  Laterality: Right;   PERIPHERAL VASCULAR INTERVENTION Right 03/14/2018   Procedure: PERIPHERAL VASCULAR INTERVENTION;  Surgeon: Waynetta Sandy, MD;  Location: [place] CV LAB;  Service: Cardiovascular;  Laterality: Right;  subclavian vein   POLYPECTOMY  07/11/2020   Procedure: POLYPECTOMY;  Surgeon: Mansouraty, Telford Nab., MD;  Location: [facility] ENDOSCOPY;  Service: Gastroenterology;;   REVISON OF ARTERIOVENOUS FISTULA Right 11/12/2020    Procedure: CREATION OF RIGHT ARM ARTERIOVENOUS FISTULA;  Surgeon: Waynetta Sandy, MD;  Location: [place];  Service: Vascular;  Laterality: Right;   SHUNTOGRAM Right 10/19/2013   Procedure: FISTULOGRAM;  Surgeon: Conrad Cavalero, MD;  Location: [facility];  Service: Cardiovascular;  Laterality: Right;   TONSILLECTOMY     VISCERAL VENOGRAPHY Right 03/14/2018   Procedure: CENTRAL VENOGRAPHY;  Surgeon: Waynetta Sandy, MD;  Location: [place] CV LAB;  Service: Cardiovascular;  Laterality: Right;  upper ext fistula     reports that she quit smoking about 37 years ago. Her smoking use included cigarettes. She has never used smokeless tobacco. She reports that she does not drink alcohol and does not use drugs.  Allergies  Allergen Reactions   Lactose Intolerance (Gi) Other (See Comments)    On MAR   Latex Rash and Itching   Penicillins Rash and Swelling    Has patient had a PCN reaction causing immediate rash, facial/tongue/throat swelling, SOB or lightheadedness with hypotension: No Has patient had a PCN reaction causing severe rash involving mucus membranes or skin necrosis: No Has patient had a PCN reaction that required hospitalization No Has patient had a PCN reaction occurring within the last 10 years: No If all of the above answers are "NO", then may proceed with Cephalosporin use.      Family History  Problem Relation Age of Onset   Anesthesia problems Neg Hx    Hypotension Neg Hx    Malignant hyperthermia Neg Hx    Pseudochol deficiency Neg Hx    Colon cancer Neg Hx        Unsure of parents who both died when she was a baby    Prior to Admission medications   Medication Sig Start Date End Date Taking? Authorizing Provider  Amino Acids-Protein Hydrolys (PRO-STAT 64 PO) Take by mouth. Give 26ml by mouth in the morning for wound healing    [provider]  apixaban (ELIQUIS) 2.5 MG TABS tablet Take 1 tablet (2.5 mg total) by mouth 2 (two) times daily.  11/27/20   Ulyses Amor, PA-C  clopidogrel (PLAVIX) 75 MG tablet Take 1 tablet (75 mg total) by mouth daily with breakfast. Patient taking differently: Take 75 mg by mouth daily with breakfast. (0800) 07/14/20   Mansouraty, Telford Nab., MD  collagenase (SANTYL) ointment Apply 1 application topically daily. Apply to sacrum topically every day shift for wound healing    [provider]  cyanocobalamin (,VITAMIN B-12,) 1000 MCG/ML injection Inject 1,000 mcg into the muscle every 30 (thirty) days. On the 28th of every month    [provider]  dolutegravir (TIVICAY) 50 MG tablet Take 50 mg by mouth daily at 4 PM.    [provider]  Emollient (MINERIN) LOTN Apply 1 application topically every 12 (twelve) hours as needed (dry skin). Apply to feet and ankles    [provider]  fentaNYL (DURAGESIC) 25 MCG/HR Place 1 patch onto the skin every 3 (three) days. 11/27/20   Ulyses Amor, PA-C  lamivudine (EPIVIR) 100 MG tablet Take 50 mg  by mouth daily. (0800)    [provider]  Lidocaine HCl 4 % CREA Apply 1 application topically every 12 (twelve) hours as needed (pain).    [provider]  lidocaine-prilocaine (EMLA) cream Apply 1 application topically See admin instructions. (0900) Apply to fistula site one hour before dialysis on Tuesday, Thursday, and Saturday.    [provider]  melatonin 3 MG TABS tablet Take 3 mg by mouth at bedtime. (2000)    [provider]  methadone (DOLOPHINE) 5 MG tablet Take 0.5 tablets (2.5 mg total) by mouth daily at 8 pm. (2000) 11/27/20   Theda Sers, Susette Racer, PA-C  metoprolol tartrate (LOPRESSOR) 25 MG tablet Take 1 tablet (25 mg total) by mouth 2 (two) times daily. 11/27/20   Ulyses Amor, PA-C  mirtazapine (REMERON) 15 MG tablet Take 15 mg by mouth at bedtime. 12/05/20   [provider]  mirtazapine (REMERON) 7.5 MG tablet Take 7.5 mg by mouth at bedtime. (2100)    [provider]   Multiple Vitamin (MULTIVITAMIN WITH MINERALS) TABS tablet Take 1 tablet by mouth at bedtime. (2000)    [provider]  nitroGLYCERIN (NITROSTAT) 0.4 MG SL tablet Place 0.4 mg under the tongue every 5 (five) minutes x 3 doses as needed for chest pain.     [provider]  ondansetron (ZOFRAN ODT) 8 MG disintegrating tablet Take 1 tablet (8 mg total) by mouth every 8 (eight) hours as needed for nausea or vomiting. 12/16/20   Erenest Rasher, PA-C  ondansetron (ZOFRAN) 8 MG tablet Take 8 mg by mouth 3 (three) times daily. 12/16/20   [provider]  Oxycodone HCl 10 MG TABS Take 1 tablet (10 mg total) by mouth every 12 (twelve) hours as needed (severe pain). 11/27/20   Ulyses Amor, PA-C  pantoprazole (PROTONIX) 40 MG tablet Take 1 tablet (40 mg total) by mouth daily before breakfast. 12/16/20   Erenest Rasher, PA-C  promethazine (PHENERGAN) 12.5 MG tablet Take 12.5 mg by mouth See admin instructions. Take 1 tablet (12.5 mg) by mouth in the morning every Tuesday, Thursday and Saturday for nausea/vomiting and every 6 hours as needed for n/v    [provider]  senna (SENOKOT) 8.6 MG tablet Take 2 tablets by mouth every evening. (2100)    [provider]  venlafaxine XR (EFFEXOR-XR) 150 MG 24 hr capsule Take 150 mg by mouth daily with breakfast. (0900)    [provider]  zidovudine (RETROVIR) 100 MG capsule Take 100 mg by mouth 3 (three) times daily. (0900, 1300, & 2100)    [provider]    Physical Exam: Vitals:   12/24/20 1600 12/24/20 1630 12/24/20 1645 12/24/20 1729  BP: (!) 163/80 (!) 169/72    Pulse: 88 84 85   Resp:    20  Temp:    100.3 F (37.9 C)  TempSrc:    Oral  SpO2: 100% 97% 99%   Weight:      Height:          Constitutional: NAD, calm, comfortable Vitals:   12/24/20 1600 12/24/20 1630 12/24/20 1645 12/24/20 1729  BP: (!) 163/80 (!) 169/72    Pulse: 88 84 85   Resp:    20  Temp:    100.3 F (37.9  C)  TempSrc:    Oral  SpO2: 100% 97% 99%   Weight:      Height:       Gen-underweight Eyes: PERRL, lids  and conjunctivae normal ENMT: Mucous membranes are moist. Posterior pharynx clear of any exudate or lesions.Normal dentition.  Neck: normal, supple, no masses, no thyromegaly Respiratory: clear to auscultation bilaterally, no wheezing, no crackles. Normal respiratory effort. No accessory muscle use.  Cardiovascular: Regular rate and rhythm, no murmurs / rubs / gallops. No extremity edema. 2+ pedal pulses. No carotid bruits.  Abdomen: no tenderness, no masses palpated. No hepatosplenomegaly. Bowel sounds positive.  Musculoskeletal: Marked RUE swelling and fluctuance. + TTP No joint deformity upper and lower extremities. Good ROM, no contractures. Normal muscle tone.  Skin: + hyperpigmentation on RUE around area of swelling and fluctuance, lesions, ulcers. No induration Neurologic: CN 2-12 grossly intact. Sensation intact, DTR normal. Strength 5/5 in all 4.  Psychiatric: Normal judgment and insight. Alert and oriented x 3. Normal mood.   Labs on Admission: I have personally reviewed following labs and imaging studies  CBC: Recent Labs  Lab 12/24/20 1518  WBC 9.2  NEUTROABS 7.6  HGB 7.8*  HCT 25.7*  MCV 109.8*  PLT 093   Basic Metabolic Panel: Recent Labs  Lab 12/24/20 1518  NA 132*  K 5.3*  CL 92*  CO2 23  GLUCOSE 68*  BUN 87*  CREATININE 10.98*  CALCIUM 8.9  MG 2.2   GFR: Estimated Creatinine Clearance: 3.1 mL/min (A) (by C-G formula based on SCr of 10.98 mg/dL (H)). Liver Function Tests: Recent Labs  Lab 12/24/20 1518  AST 12*  ALT 9  ALKPHOS 88  BILITOT 0.6  PROT 7.8  ALBUMIN 2.9*   No results for input(s): LIPASE, AMYLASE in the last 168 hours. No results for input(s): AMMONIA in the last 168 hours. Coagulation Profile: No results for input(s): INR, PROTIME in the last 168 hours. Cardiac Enzymes: No results for input(s): CKTOTAL, CKMB, CKMBINDEX,  TROPONINI in the last 168 hours. BNP (last 3 results) No results for input(s): PROBNP in the last 8760 hours. HbA1C: No results for input(s): HGBA1C in the last 72 hours. CBG: Recent Labs  Lab 12/24/20 1651  GLUCAP 57*   Lipid Profile: No results for input(s): CHOL, HDL, LDLCALC, TRIG, CHOLHDL, LDLDIRECT in the last 72 hours. Thyroid Function Tests: No results for input(s): TSH, T4TOTAL, FREET4, T3FREE, THYROIDAB in the last 72 hours. Anemia Panel: No results for input(s): VITAMINB12, FOLATE, FERRITIN, TIBC, IRON, RETICCTPCT in the last 72 hours. Urine analysis:    Component Value Date/Time   COLORURINE YELLOW 01/19/2009 1855   APPEARANCEUR CLEAR 01/19/2009 1855   LABSPEC 1.015 01/19/2009 1855   PHURINE 7.5 01/19/2009 1855   GLUCOSEU NEGATIVE 01/19/2009 1855   HGBUR SMALL (A) 01/19/2009 1855   BILIRUBINUR NEGATIVE 01/19/2009 1855   KETONESUR NEGATIVE 01/19/2009 1855   PROTEINUR 100 (A) 01/19/2009 1855   UROBILINOGEN 0.2 01/19/2009 1855   NITRITE NEGATIVE 01/19/2009 1855   LEUKOCYTESUR NEGATIVE 01/19/2009 1855   Sepsis Labs: @LABRCNTIP (procalcitonin:4,lacticidven:4) ) Recent Results (from the past 240 hour(s))  Resp Panel by RT-PCR (Flu A&B, Covid) Nasopharyngeal Swab     Status: None   Collection Time: 12/24/20  2:53 PM   Specimen: Nasopharyngeal Swab; Nasopharyngeal(NP) swabs in vial transport medium  Result Value Ref Range Status   SARS Coronavirus 2 by RT PCR NEGATIVE NEGATIVE Final    Comment: (NOTE) SARS-CoV-2 target nucleic acids are NOT DETECTED.  The SARS-CoV-2 RNA is generally detectable in upper respiratory specimens during the acute phase of infection. The lowest concentration of SARS-CoV-2 viral copies this assay can detect is 138 copies/mL. A negative result does not preclude  SARS-Cov-2 infection and should not be used as the sole basis for treatment or other patient management decisions. A negative result may occur with  improper specimen  collection/handling, submission of specimen other than nasopharyngeal swab, presence of viral mutation(s) within the areas targeted by this assay, and inadequate number of viral copies(<138 copies/mL). A negative result must be combined with clinical observations, patient history, and epidemiological information. The expected result is Negative.  Fact Sheet for Patients:  EntrepreneurPulse.com.au  Fact Sheet for Healthcare Providers:  IncredibleEmployment.be  This test is no t yet approved or cleared by the [place] FDA and  has been authorized for detection and/or diagnosis of SARS-CoV-2 by FDA under an Emergency Use Authorization (EUA). This EUA will remain  in effect (meaning this test can be used) for the duration of the COVID-19 declaration under Section 564(b)(1) of the Act, 21 U.S.C.section 360bbb-3(b)(1), unless the authorization is terminated  or revoked sooner.       Influenza A by PCR NEGATIVE NEGATIVE Final   Influenza B by PCR NEGATIVE NEGATIVE Final    Comment: (NOTE) The Xpert Xpress SARS-CoV-2/FLU/RSV plus assay is intended as an aid in the diagnosis of influenza from Nasopharyngeal swab specimens and should not be used as a sole basis for treatment. Nasal washings and aspirates are unacceptable for Xpert Xpress SARS-CoV-2/FLU/RSV testing.  Fact Sheet for Patients: EntrepreneurPulse.com.au  Fact Sheet for Healthcare Providers: IncredibleEmployment.be  This test is not yet approved or cleared by the [place] FDA and has been authorized for detection and/or diagnosis of SARS-CoV-2 by FDA under an Emergency Use Authorization (EUA). This EUA will remain in effect (meaning this test can be used) for the duration of the COVID-19 declaration under Section 564(b)(1) of the Act, 21 U.S.C. section 360bbb-3(b)(1), unless the authorization is terminated or revoked.  Performed at [facility], [address] [place], [place] [address]   Blood culture (routine x 2)     Status: None (Preliminary result)   Collection Time: 12/24/20  3:19 PM   Specimen: BLOOD LEFT HAND  Result Value Ref Range Status   Specimen Description BLOOD LEFT HAND  Final   Special Requests   Final    BOTTLES DRAWN AEROBIC AND ANAEROBIC Blood Culture adequate volume Performed at [facility], [address], [place], [place] [address]    Culture PENDING  Incomplete   Report Status PENDING  Incomplete  Blood culture (routine x 2)     Status: None (Preliminary result)   Collection Time: 12/24/20  3:19 PM   Specimen: BLOOD LEFT ARM  Result Value Ref Range Status   Specimen Description BLOOD LEFT ARM  Final   Special Requests   Final    BOTTLES DRAWN AEROBIC AND ANAEROBIC Blood Culture adequate volume Performed at [facility], [address], [place], [place] [address]    Culture PENDING  Incomplete   Report Status PENDING  Incomplete     Radiological Exams on Admission: DG Chest Port 1 View  Result Date: 12/24/2020 CLINICAL DATA:  Recent shunt placement, pain EXAM: PORTABLE CHEST 1 VIEW COMPARISON:  04/06/2020 FINDINGS: Gross cardiomegaly and pulmonary vascular prominence. No acute appearing airspace opacity. Right brachiocephalic venous stent. Surgical clips in the right axilla. IMPRESSION: Gross cardiomegaly and pulmonary vascular prominence. No overt edema or acute appearing airspace opacity. Electronically Signed   By: Delanna Ahmadi M.D.   On: 12/24/2020 16:11      Assessment/Plan Active Problems:   Infected fluid collection with fistula   1-Infected RUE  fistula  -noted recent procedures on 9/13 including: Ligation and resection of right arm radiocephalic AV fistula and Turndown of upper arm cephalic vein to brachial artery creation of brachial cephalic AV fistula on 3/35/82 by Dr. 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 -noted marked RUE swelling over past 5-6 days on presentation today -Vascular surgery formally consulted with plan for tunnel catheter placement for urgent dialysis at [facility] with likely formal reassessment of fistula by vascular surgery.  - We will place on IV vancomycin and cefepime for infectious coverage in the interim - Blood cultures drawn in the ER prior to IV antibiotic administration - Pain control -follow up any further vascular surgery recommendations  - Follow  2-ESRD on HD TTS -Pt has missed 2 sessions of HD 2/2 infected RUE fistula  -nephrology consulted, appreciate input  -will plan for for HD pending placement of tunnel catheter and urgent HD  -pt does not appear volume overloaded at present, though with cardiomegaly and pulm vascular congestion on CXR -K mildly elevated at 5.8  -will give kayexalate x 1 -trend K  -pending transfer to [facility] for HD access and urgent HD  -follow  3-Atrial flutter  -HR stable at present  -holding eliquis pending vascular access assessment  -consider heparin gtt depending on length of time waiting for patient to be transferred. -cont BB  4-HIV  -CD4 count 252 on 9/21 -cont HAART treatment     DVT prophylaxis: SCDs pending restarting of home [facility] after establishment of vascular access   Code Status: Full Code  Family Communication: Plan of care discussed w/ patient at the bedside  Disposition Plan: Pending transfer to [facility] called: NephrologyMarval Regal MD, Vascular Surgery- Early MD   Admission status: Inpatient. Anticipate >2MN stay   Deneise Lever MD [facility] Pager [phone] (available via secure chat on Epic)  If 7PM-7AM, please contact night-coverage [email or web address] Password [facility]  12/24/2020, 5:33 PM

## 2020-12-24 NOTE — ED Notes (Signed)
Unsuccessful IV attempt x 2, primary RN aware.

## 2020-12-25 ENCOUNTER — Ambulatory Visit (HOSPITAL_COMMUNITY): Payer: Medicaid Other

## 2020-12-25 DIAGNOSIS — I4892 Unspecified atrial flutter: Secondary | ICD-10-CM

## 2020-12-25 DIAGNOSIS — D638 Anemia in other chronic diseases classified elsewhere: Secondary | ICD-10-CM

## 2020-12-25 DIAGNOSIS — E162 Hypoglycemia, unspecified: Secondary | ICD-10-CM | POA: Diagnosis present

## 2020-12-25 DIAGNOSIS — I10 Essential (primary) hypertension: Secondary | ICD-10-CM

## 2020-12-25 DIAGNOSIS — N186 End stage renal disease: Secondary | ICD-10-CM | POA: Diagnosis not present

## 2020-12-25 LAB — COMPREHENSIVE METABOLIC PANEL
ALT: 7 U/L (ref 0–44)
AST: 10 U/L — ABNORMAL LOW (ref 15–41)
Albumin: 2.6 g/dL — ABNORMAL LOW (ref 3.5–5.0)
Alkaline Phosphatase: 87 U/L (ref 38–126)
Anion gap: 19 — ABNORMAL HIGH (ref 5–15)
BUN: 93 mg/dL — ABNORMAL HIGH (ref 8–23)
CO2: 21 mmol/L — ABNORMAL LOW (ref 22–32)
Calcium: 9 mg/dL (ref 8.9–10.3)
Chloride: 91 mmol/L — ABNORMAL LOW (ref 98–111)
Creatinine, Ser: 11.47 mg/dL — ABNORMAL HIGH (ref 0.44–1.00)
GFR, Estimated: 3 mL/min — ABNORMAL LOW (ref >60)
Glucose, Bld: 61 mg/dL — ABNORMAL LOW (ref 70–99)
Potassium: 4.9 mmol/L (ref 3.5–5.1)
Sodium: 131 mmol/L — ABNORMAL LOW (ref 135–145)
Total Bilirubin: 0.9 mg/dL (ref 0.3–1.2)
Total Protein: 7.1 g/dL (ref 6.5–8.1)

## 2020-12-25 LAB — GLUCOSE, CAPILLARY
Glucose-Capillary: 64 mg/dL — ABNORMAL LOW (ref 70–99)
Glucose-Capillary: 62 mg/dL — ABNORMAL LOW (ref 70–99)
Glucose-Capillary: 159 mg/dL — ABNORMAL HIGH (ref 70–99)
Glucose-Capillary: 91 mg/dL (ref 70–99)

## 2020-12-25 LAB — CBC WITH DIFFERENTIAL/PLATELET
Abs Immature Granulocytes: 0.06 10*3/uL (ref 0.00–0.07)
Basophils Absolute: 0 10*3/uL (ref 0.0–0.1)
Basophils Relative: 0 %
Eosinophils Absolute: 0.1 10*3/uL (ref 0.0–0.5)
Eosinophils Relative: 1 %
HCT: 23.9 % — ABNORMAL LOW (ref 36.0–46.0)
Hemoglobin: 7.3 g/dL — ABNORMAL LOW (ref 12.0–15.0)
Immature Granulocytes: 1 %
Lymphocytes Relative: 14 %
Lymphs Abs: 1.1 10*3/uL (ref 0.7–4.0)
MCH: 32.9 pg (ref 26.0–34.0)
MCHC: 30.5 g/dL (ref 30.0–36.0)
MCV: 107.7 fL — ABNORMAL HIGH (ref 80.0–100.0)
Monocytes Absolute: 0.7 10*3/uL (ref 0.1–1.0)
Monocytes Relative: 9 %
Neutro Abs: 6.1 10*3/uL (ref 1.7–7.7)
Neutrophils Relative %: 75 %
Platelets: 194 10*3/uL (ref 150–400)
RBC: 2.22 MIL/uL — ABNORMAL LOW (ref 3.87–5.11)
RDW: 17.2 % — ABNORMAL HIGH (ref 11.5–15.5)
WBC: 8.1 10*3/uL (ref 4.0–10.5)
nRBC: 0 % (ref 0.0–0.2)

## 2020-12-25 LAB — CBG MONITORING, ED
Glucose-Capillary: 170 mg/dL — ABNORMAL HIGH (ref 70–99)
Glucose-Capillary: 37 mg/dL — CL (ref 70–99)
Glucose-Capillary: 116 mg/dL — ABNORMAL HIGH (ref 70–99)

## 2020-12-25 MED ORDER — METHADONE HCL 5 MG PO TABS
2.5000 mg | ORAL_TABLET | Freq: Every day | ORAL | Status: DC
Start: 2020-12-25 — End: 2021-01-10
  Administered 2020-12-25 – 2021-01-09 (×16): 2.5 mg via ORAL
  Filled 2020-12-25 (×18): qty 1

## 2020-12-25 MED ORDER — VANCOMYCIN VARIABLE DOSE PER UNSTABLE RENAL FUNCTION (PHARMACIST DOSING): Status: DC

## 2020-12-25 MED ORDER — DEXTROSE 50 % IV SOLN
INTRAVENOUS | Status: AC
  Administered 2020-12-25: 50 mL via INTRAVENOUS
  Filled 2020-12-25: qty 50

## 2020-12-25 MED ORDER — VANCOMYCIN HCL IN DEXTROSE 1-5 GM/200ML-% IV SOLN: 1000.0000 mg | Freq: Once | INTRAVENOUS | Status: DC

## 2020-12-25 MED ORDER — VENLAFAXINE HCL ER 150 MG PO CP24
150.0000 mg | ORAL_CAPSULE | Freq: Every day | ORAL | Status: DC
  Administered 2020-12-26 – 2021-01-09 (×13): 150 mg via ORAL
  Filled 2020-12-25 (×16): qty 1

## 2020-12-25 MED ORDER — SODIUM CHLORIDE 0.9 % IV SOLN
1.0000 g | INTRAVENOUS | Status: DC
  Administered 2020-12-25 – 2020-12-28 (×4): 1 g via INTRAVENOUS
  Filled 2020-12-25 (×7): qty 1

## 2020-12-25 MED ORDER — SODIUM CHLORIDE 0.9 % IV SOLN: 2.0000 g | Freq: Once | INTRAVENOUS | Status: DC

## 2020-12-25 MED ORDER — DOLUTEGRAVIR SODIUM 50 MG PO TABS
50.0000 mg | ORAL_TABLET | Freq: Every day | ORAL | Status: DC
  Administered 2020-12-25 – 2021-01-09 (×14): 50 mg via ORAL
  Filled 2020-12-25 (×18): qty 1

## 2020-12-25 MED ORDER — MIRTAZAPINE 15 MG PO TABS
15.0000 mg | ORAL_TABLET | Freq: Every day | ORAL | Status: DC
  Administered 2020-12-25 – 2021-01-09 (×16): 15 mg via ORAL
  Filled 2020-12-25 (×16): qty 1

## 2020-12-25 MED ORDER — DEXTROSE 50 % IV SOLN
INTRAVENOUS | Status: AC
  Administered 2020-12-25: 25 mL
  Filled 2020-12-25: qty 50

## 2020-12-25 MED ORDER — CHLORHEXIDINE GLUCONATE CLOTH 2 % EX PADS
6.0000 | MEDICATED_PAD | Freq: Every day | CUTANEOUS | Status: DC
  Administered 2020-12-26 – 2021-01-09 (×15): 6 via TOPICAL

## 2020-12-25 NOTE — ED Notes (Signed)
Patient given clear liquid meal tray

## 2020-12-25 NOTE — Progress Notes (Signed)
PROGRESS NOTE    [name]  FIE:[id number] DOB: 1950-04-08 DOA: 12/24/2020 PCP: Hilbert Corrigan, MD    Brief Narrative:  70 year old female with a history of end-stage renal disease on hemodialysis Tuesday Thursday Saturday, HIV, atrial flutter on Eliquis, admitted to the hospital with right upper extremity AV fistula infection.  She was started on IV antibiotics.  She has missed her last 2 dialysis sessions due to issues with her access.  Currently, volume status appears to be okay.  She was seen by nephrology as well as vascular surgery who felt she would be transferred to [facility] for further evaluation of her fistula and placement of temporary dialysis access.  She is on IV antibiotics.   Assessment & Plan:   Active Problems:   Human immunodeficiency virus (HIV) disease ([place])   Essential hypertension   ESRD on dialysis ([place])   Atrial flutter ([place])   Anemia of chronic disease   Infected fluid collection with fistula   Hypoglycemia   Right upper extremity fistula infection -Seen by vascular surgery with recommendations to transfer to [facility] for further vascular surgery evaluation -She is on IV antibiotics with vancomycin and cefepime -Keep right upper extremity elevated  End-stage renal disease on hemodialysis -TTS -She missed her last 2 dialysis sessions on Saturday and Tuesday -Currently, potassium appears to be improved after Lokelma -Does not appear to be volume overloaded -Nephrology following -Plans for temporary dialysis catheter placement until more permanent access can be addressed  Atrial flutter -Heart rate currently stable -Chronically on Eliquis, currently on hold due to vascular access placement -may need to consider starting IV heparin if transfer to [facility] further delayed  HIV -continue on antiretrovirals -CD4 count 252 on 9/21  Anemia, likely related to chronic kidney disease -baseline hemoglobin appears to be  8-9 -currently hemoglobin 7.3, no signs of bleeding at present -transfuse for hemoglobin <7  Chronic pain syndrome -continued on home dose of fentanyl patch and methadone  Hypoglycemia -continue to follow serial CBGs -likely related to NPO status -started on clear liquids for now  Hyperkalemia -improved after lokelma   DVT prophylaxis: SCDs Start: 12/24/20 1728  Code Status: full code Family Communication: no family present Disposition Plan: Status is: Inpatient  Remains inpatient appropriate because: continued IV antibiotics for AV fistula infection         Consultants:  Nephrology Vascular surgery  Procedures:    Antimicrobials:  Vancomycin 10/25> Cefepime 10/25>    Subjective: Complains of pain in her left arm today, says it began overnight. Continues to have discomfort and swelling in her right arm  Objective: Vitals:   12/25/20 0945 12/25/20 1100 12/25/20 1130 12/25/20 1149  BP:  128/67 116/61   Pulse: 78 73 71 76  Resp: 20 13 12 16   Temp:    98.5 F (36.9 C)  TempSrc:    Oral  SpO2: 97% 96% 100% 100%  Weight:      Height:        Intake/Output Summary (Last 24 hours) at 12/25/2020 1201 Last data filed at 12/24/2020 1939 Gross per 24 hour  Intake 401.06 ml  Output --  Net 401.06 ml   Filed Weights   12/24/20 1234  Weight: 39.9 kg    Examination:  General exam: Appears calm and comfortable  Respiratory system: Clear to auscultation. Respiratory effort normal. Cardiovascular system: S1 & S2 heard, RRR. No JVD, murmurs, rubs, gallops or clicks. No pedal edema. Gastrointestinal system: Abdomen is nondistended, soft and  nontender. No organomegaly or masses felt. Normal bowel sounds heard. Central nervous system: Alert and oriented. No focal neurological deficits. Extremities: right arm is edematous and erythematous, left arm without swelling or redness. Pulses intact in left hand Skin: No rashes, lesions or ulcers Psychiatry: Judgement  and insight appear normal. Mood & affect appropriate.     Data Reviewed: I have personally reviewed following labs and imaging studies  CBC: Recent Labs  Lab 12/24/20 1518 12/25/20 0409  WBC 9.2 8.1  NEUTROABS 7.6 6.1  HGB 7.8* 7.3*  HCT 25.7* 23.9*  MCV 109.8* 107.7*  PLT 205 149   Basic Metabolic Panel: Recent Labs  Lab 12/24/20 1518 12/25/20 0409  NA 132* 131*  K 5.3* 4.9  CL 92* 91*  CO2 23 21*  GLUCOSE 68* 61*  BUN 87* 93*  CREATININE 10.98* 11.47*  CALCIUM 8.9 9.0  MG 2.2  --    GFR: Estimated Creatinine Clearance: 3 mL/min (A) (by C-G formula based on SCr of 11.47 mg/dL (H)). Liver Function Tests: Recent Labs  Lab 12/24/20 1518 12/25/20 0409  AST 12* 10*  ALT 9 7  ALKPHOS 88 87  BILITOT 0.6 0.9  PROT 7.8 7.1  ALBUMIN 2.9* 2.6*   No results for input(s): LIPASE, AMYLASE in the last 168 hours. No results for input(s): AMMONIA in the last 168 hours. Coagulation Profile: No results for input(s): INR, PROTIME in the last 168 hours. Cardiac Enzymes: No results for input(s): CKTOTAL, CKMB, CKMBINDEX, TROPONINI in the last 168 hours. BNP (last 3 results) No results for input(s): PROBNP in the last 8760 hours. HbA1C: No results for input(s): HGBA1C in the last 72 hours. CBG: Recent Labs  Lab 12/24/20 1651  GLUCAP 57*   Lipid Profile: No results for input(s): CHOL, HDL, LDLCALC, TRIG, CHOLHDL, LDLDIRECT in the last 72 hours. Thyroid Function Tests: No results for input(s): TSH, T4TOTAL, FREET4, T3FREE, THYROIDAB in the last 72 hours. Anemia Panel: No results for input(s): VITAMINB12, FOLATE, FERRITIN, TIBC, IRON, RETICCTPCT in the last 72 hours. Sepsis Labs: Recent Labs  Lab 12/24/20 1518 12/24/20 1658  LATICACIDVEN 1.6 1.0    Recent Results (from the past 240 hour(s))  Resp Panel by RT-PCR (Flu A&B, Covid) Nasopharyngeal Swab     Status: None   Collection Time: 12/24/20  2:53 PM   Specimen: Nasopharyngeal Swab; Nasopharyngeal(NP) swabs in  vial transport medium  Result Value Ref Range Status   SARS Coronavirus 2 by RT PCR NEGATIVE NEGATIVE Final    Comment: (NOTE) SARS-CoV-2 target nucleic acids are NOT DETECTED.  The SARS-CoV-2 RNA is generally detectable in upper respiratory specimens during the acute phase of infection. The lowest concentration of SARS-CoV-2 viral copies this assay can detect is 138 copies/mL. A negative result does not preclude SARS-Cov-2 infection and should not be used as the sole basis for treatment or other patient management decisions. A negative result may occur with  improper specimen collection/handling, submission of specimen other than nasopharyngeal swab, presence of viral mutation(s) within the areas targeted by this assay, and inadequate number of viral copies(<138 copies/mL). A negative result must be combined with clinical observations, patient history, and epidemiological information. The expected result is Negative.  Fact Sheet for Patients:  EntrepreneurPulse.com.au  Fact Sheet for Healthcare Providers:  IncredibleEmployment.be  This test is no t yet approved or cleared by the [place] FDA and  has been authorized for detection and/or diagnosis of SARS-CoV-2 by FDA under an Emergency Use Authorization (EUA). This EUA will remain  in effect (meaning this test can be used) for the duration of the COVID-19 declaration under Section 564(b)(1) of the Act, 21 U.S.C.section 360bbb-3(b)(1), unless the authorization is terminated  or revoked sooner.       Influenza A by PCR NEGATIVE NEGATIVE Final   Influenza B by PCR NEGATIVE NEGATIVE Final    Comment: (NOTE) The Xpert Xpress SARS-CoV-2/FLU/RSV plus assay is intended as an aid in the diagnosis of influenza from Nasopharyngeal swab specimens and should not be used as a sole basis for treatment. Nasal washings and aspirates are unacceptable for Xpert Xpress SARS-CoV-2/FLU/RSV testing.  Fact  Sheet for Patients: EntrepreneurPulse.com.au  Fact Sheet for Healthcare Providers: IncredibleEmployment.be  This test is not yet approved or cleared by the [place] FDA and has been authorized for detection and/or diagnosis of SARS-CoV-2 by FDA under an Emergency Use Authorization (EUA). This EUA will remain in effect (meaning this test can be used) for the duration of the COVID-19 declaration under Section 564(b)(1) of the Act, 21 U.S.C. section 360bbb-3(b)(1), unless the authorization is terminated or revoked.  Performed at [facility], [address], [place], [place] [address]   Blood culture (routine x 2)     Status: None (Preliminary result)   Collection Time: 12/24/20  3:19 PM   Specimen: BLOOD LEFT HAND  Result Value Ref Range Status   Specimen Description BLOOD LEFT HAND  Final   Special Requests   Final    BOTTLES DRAWN AEROBIC AND ANAEROBIC Blood Culture adequate volume   Culture   Final    NO GROWTH < 24 HOURS Performed at [facility], [address], [place], [place] [address]    Report Status PENDING  Incomplete  Blood culture (routine x 2)     Status: None (Preliminary result)   Collection Time: 12/24/20  3:19 PM   Specimen: BLOOD LEFT ARM  Result Value Ref Range Status   Specimen Description BLOOD LEFT ARM  Final   Special Requests   Final    BOTTLES DRAWN AEROBIC AND ANAEROBIC Blood Culture adequate volume   Culture   Final    NO GROWTH < 24 HOURS Performed at [facility], [address] [place], [place] [address]    Report Status PENDING  Incomplete         Radiology Studies: DG Chest Port 1 View  Result Date: 12/24/2020 CLINICAL DATA:  Recent shunt placement, pain EXAM: PORTABLE CHEST 1 VIEW COMPARISON:  04/06/2020 FINDINGS: Gross cardiomegaly and pulmonary vascular prominence. No acute appearing airspace opacity. Right brachiocephalic venous stent. Surgical clips in the right axilla. IMPRESSION: Gross  cardiomegaly and pulmonary vascular prominence. No overt edema or acute appearing airspace opacity. Electronically Signed   By: Delanna Ahmadi M.D.   On: 12/24/2020 16:11        Scheduled Meds:  dolutegravir  50 mg Oral q1600   fentaNYL  1 patch Transdermal Q72H   lamivudine  50 mg Oral Daily   methadone  2.5 mg Oral Q2000   metoprolol tartrate  25 mg Oral BID   mirtazapine  15 mg Oral QHS   pantoprazole  40 mg Oral QAC breakfast   [START ON 12/26/2020] venlafaxine XR  150 mg Oral Q breakfast   zidovudine  100 mg Oral TID   Continuous Infusions:  ceFEPime (MAXIPIME) IV       LOS: 1 day    Time spent: 34mins    Kathie Dike, MD [facility]   If 7PM-7AM, please contact night-coverage [email or web address]  12/25/2020, 12:01 PM

## 2020-12-25 NOTE — ED Notes (Signed)
[facility] at bedside to transport patient. 

## 2020-12-25 NOTE — ED Notes (Signed)
Patient c/o heel pain and feeling cold at this time. Patients heels floated with pillow and blanket given. Patient resting comfortably with call light in hand. Denies any further needs at this time.

## 2020-12-25 NOTE — Progress Notes (Signed)
I see that patient is finally being transported to [facility].  I doubt that IR will be able to place temp cath tonight-  will likely plan for HD tomorrow after catheter placement   [place]

## 2020-12-25 NOTE — Progress Notes (Signed)
New Admission Note:  Arrival Method: Via [facility] - transfer from [place] Orientation:A& O x 4 Telemetry:box 15/NSR Assessment: Completed Skin:dry & flakry, R arm swollen + 3 IV: L arm = dead fistula  Pain:9/10 Safety Measures: Safety Fall Prevention Plan was given, discussed. 5M15: Patient has been orientated to the room, unit and the staff. Family: none @ the bedside   Orders have been reviewed and implemented. Will continue to monitor the patient. Call light has been placed within reach and bed alarm has been activated.   Arta Silence ,RN

## 2020-12-25 NOTE — Progress Notes (Signed)
Hypoglycemic Event  CBG: 64, Soda and Lemon Ice given then checked again and dropped to 62. D5 =12.5 given will re-check blood sugar again   Treatment: 4 oz juice/soda  Symptoms: None  Follow-up CBG: Time:1715 CBG Result:62  Possible Reasons for Event: Inadequate meal intake      [name]

## 2020-12-25 NOTE — ED Notes (Signed)
per Theadora Rama at [facility] they are waiting for those medications ( antivirals) to come in to them.  they are suppose to be delivered tomorrow night but she is unsure if they will come because she is here.

## 2020-12-25 NOTE — Progress Notes (Signed)
Patient ID: [name], female   DOB: [birth year], 70 y.o.   MRN: [id number] S: Pt still complaining of right arm pain.  CBG was 37 in ED  O:BP 116/61   Pulse 76   Temp 98.5 F (36.9 C) (Oral)   Resp 16   Ht 5' 2.01" (1.575 m)   Wt 39.9 kg   LMP  (LMP Unknown)   SpO2 100%   BMI 16.09 kg/m   Intake/Output Summary (Last 24 hours) at 12/25/2020 1204 Last data filed at 12/24/2020 1939 Gross per 24 hour  Intake 401.06 ml  Output --  Net 401.06 ml   Intake/Output: I/O last 3 completed shifts: In: 401.1 [P.O.:100; IV Piggyback:301.1] Out: -   Intake/Output this shift:  No intake/output data recorded. Weight change:  Gen:NAD CVS: RRR Resp:CTA Abd: +BS,s oft, NT/ND Ext: RUE AVF +T/B, right arm more edematous and tender with fluctuant area in antecubital region.  Recent Labs  Lab 12/24/20 1518 12/25/20 0409  NA 132* 131*  K 5.3* 4.9  CL 92* 91*  CO2 23 21*  GLUCOSE 68* 61*  BUN 87* 93*  CREATININE 10.98* 11.47*  ALBUMIN 2.9* 2.6*  CALCIUM 8.9 9.0  AST 12* 10*  ALT 9 7   Liver Function Tests: Recent Labs  Lab 12/24/20 1518 12/25/20 0409  AST 12* 10*  ALT 9 7  ALKPHOS 88 87  BILITOT 0.6 0.9  PROT 7.8 7.1  ALBUMIN 2.9* 2.6*   No results for input(s): LIPASE, AMYLASE in the last 168 hours. No results for input(s): AMMONIA in the last 168 hours. CBC: Recent Labs  Lab 12/24/20 1518 12/25/20 0409  WBC 9.2 8.1  NEUTROABS 7.6 6.1  HGB 7.8* 7.3*  HCT 25.7* 23.9*  MCV 109.8* 107.7*  PLT 205 194   Cardiac Enzymes: No results for input(s): CKTOTAL, CKMB, CKMBINDEX, TROPONINI in the last 168 hours. CBG: Recent Labs  Lab 12/24/20 1651 12/25/20 1159  GLUCAP 57* 37*    Iron Studies: No results for input(s): IRON, TIBC, TRANSFERRIN, FERRITIN in the last 72 hours. Studies/Results: DG Chest Port 1 View  Result Date: 12/24/2020 CLINICAL DATA:  Recent shunt placement, pain EXAM: PORTABLE CHEST 1 VIEW COMPARISON:  04/06/2020 FINDINGS: Gross cardiomegaly  and pulmonary vascular prominence. No acute appearing airspace opacity. Right brachiocephalic venous stent. Surgical clips in the right axilla. IMPRESSION: Gross cardiomegaly and pulmonary vascular prominence. No overt edema or acute appearing airspace opacity. Electronically Signed   By: Delanna Ahmadi M.D.   On: 12/24/2020 16:11    dextrose       dolutegravir  50 mg Oral q1600   fentaNYL  1 patch Transdermal Q72H   lamivudine  50 mg Oral Daily   methadone  2.5 mg Oral Q2000   metoprolol tartrate  25 mg Oral BID   mirtazapine  15 mg Oral QHS   pantoprazole  40 mg Oral QAC breakfast   [START ON 12/26/2020] venlafaxine XR  150 mg Oral Q breakfast   zidovudine  100 mg Oral TID    BMET    Component Value Date/Time   NA 131 (L) 12/25/2020 0409   K 4.9 12/25/2020 0409   CL 91 (L) 12/25/2020 0409   CO2 21 (L) 12/25/2020 0409   GLUCOSE 61 (L) 12/25/2020 0409   BUN 93 (H) 12/25/2020 0409   CREATININE 11.47 (H) 12/25/2020 0409   CREATININE 5.66 (H) 12/22/2019 1117   CALCIUM 9.0 12/25/2020 0409   CALCIUM 9.0 03/12/2011 0527   GFRNONAA 3 (L) 12/25/2020  [place] (L) 02/12/2015 1000   GFRAA 9 (L) 10/22/2019 0105   GFRAA 5 (L) 02/12/2015 1000   CBC    Component Value Date/Time   WBC 8.1 12/25/2020 0409   RBC 2.22 (L) 12/25/2020 0409   HGB 7.3 (L) 12/25/2020 0409   HCT 23.9 (L) 12/25/2020 0409   PLT 194 12/25/2020 0409   MCV 107.7 (H) 12/25/2020 0409   MCH 32.9 12/25/2020 0409   MCHC 30.5 12/25/2020 0409   RDW 17.2 (H) 12/25/2020 0409   LYMPHSABS 1.1 12/25/2020 0409   MONOABS 0.7 12/25/2020 0409   EOSABS 0.1 12/25/2020 0409   BASOSABS 0.0 12/25/2020 0409    Dialysis Orders: Center: [facility]  on TTS . EDW 41.5kg HD Bath 2K/2.5Ca  Time 3 hours Heparin 1000 units bolus then 500 units/hr. Access RUE AVF BFR 350 DFR 500    Calcitriol 1.25 mcg po/HD Epogen 3600 Units IV/HD  Venofer  50 mg IV q week  Other Sensipar 60 mg tiw with HD   Assessment/Plan:  Infection of  old right forearm AVF - s/p recent revision.  Agree with Dr. Donnetta Hutching regarding transfer to [facility] for IV antibiotics, temp HD catheter placement, and vascular surgery evaluation.  Still awaiting bed for transfer to [facility]  Given IV vanc and cefepime.  ESRD -  Awaiting transfer to [facility] and placement of HD catheter prior to HD hopefully soon.  Hypertension/volume  - stable  Anemia  - continue with ESA.  Transfuse with HD.  Metabolic bone disease -  continue with home meds.  Nutrition - renal diet. Hypoglycemia - ok to give 1 amp of D50  Hyperkalemia - improved with lokelma.  Awaiting HD cath placement to proceed with HD.   Donetta Potts, MD [name] [phone]

## 2020-12-26 ENCOUNTER — Inpatient Hospital Stay (HOSPITAL_COMMUNITY): Payer: Medicare Other

## 2020-12-26 DIAGNOSIS — B20 Human immunodeficiency virus [HIV] disease: Secondary | ICD-10-CM | POA: Diagnosis not present

## 2020-12-26 DIAGNOSIS — E162 Hypoglycemia, unspecified: Secondary | ICD-10-CM | POA: Diagnosis not present

## 2020-12-26 DIAGNOSIS — D638 Anemia in other chronic diseases classified elsewhere: Secondary | ICD-10-CM | POA: Diagnosis not present

## 2020-12-26 DIAGNOSIS — Z992 Dependence on renal dialysis: Secondary | ICD-10-CM

## 2020-12-26 DIAGNOSIS — L089 Local infection of the skin and subcutaneous tissue, unspecified: Secondary | ICD-10-CM | POA: Diagnosis not present

## 2020-12-26 DIAGNOSIS — T827XXA Infection and inflammatory reaction due to other cardiac and vascular devices, implants and grafts, initial encounter: Secondary | ICD-10-CM | POA: Diagnosis not present

## 2020-12-26 HISTORY — PX: IR US GUIDE VASC ACCESS LEFT: IMG2389

## 2020-12-26 HISTORY — PX: IR FLUORO GUIDE CV LINE LEFT: IMG2282

## 2020-12-26 LAB — CBC
HCT: 24.2 % — ABNORMAL LOW (ref 36.0–46.0)
Hemoglobin: 7.4 g/dL — ABNORMAL LOW (ref 12.0–15.0)
MCH: 31.8 pg (ref 26.0–34.0)
MCHC: 30.6 g/dL (ref 30.0–36.0)
MCV: 103.9 fL — ABNORMAL HIGH (ref 80.0–100.0)
Platelets: 174 10*3/uL (ref 150–400)
RBC: 2.33 MIL/uL — ABNORMAL LOW (ref 3.87–5.11)
RDW: 17.2 % — ABNORMAL HIGH (ref 11.5–15.5)
WBC: 8.8 10*3/uL (ref 4.0–10.5)
nRBC: 0 % (ref 0.0–0.2)

## 2020-12-26 LAB — BASIC METABOLIC PANEL
Anion gap: 17 — ABNORMAL HIGH (ref 5–15)
BUN: 103 mg/dL — ABNORMAL HIGH (ref 8–23)
CO2: 20 mmol/L — ABNORMAL LOW (ref 22–32)
Calcium: 9.2 mg/dL (ref 8.9–10.3)
Chloride: 93 mmol/L — ABNORMAL LOW (ref 98–111)
Creatinine, Ser: 13.14 mg/dL — ABNORMAL HIGH (ref 0.44–1.00)
GFR, Estimated: 3 mL/min — ABNORMAL LOW (ref >60)
Glucose, Bld: 72 mg/dL (ref 70–99)
Potassium: 6.2 mmol/L — ABNORMAL HIGH (ref 3.5–5.1)
Sodium: 130 mmol/L — ABNORMAL LOW (ref 135–145)

## 2020-12-26 LAB — SURGICAL PCR SCREEN
MRSA, PCR: NEGATIVE
Staphylococcus aureus: NEGATIVE

## 2020-12-26 LAB — GLUCOSE, CAPILLARY
Glucose-Capillary: 113 mg/dL — ABNORMAL HIGH (ref 70–99)
Glucose-Capillary: 59 mg/dL — ABNORMAL LOW (ref 70–99)
Glucose-Capillary: 72 mg/dL (ref 70–99)
Glucose-Capillary: 73 mg/dL (ref 70–99)
Glucose-Capillary: 75 mg/dL (ref 70–99)
Glucose-Capillary: 92 mg/dL (ref 70–99)
Glucose-Capillary: 95 mg/dL (ref 70–99)

## 2020-12-26 MED ORDER — RENA-VITE PO TABS
1.0000 | ORAL_TABLET | Freq: Every day | ORAL | Status: DC
  Administered 2020-12-26 – 2021-01-09 (×15): 1 via ORAL
  Filled 2020-12-26 (×15): qty 1

## 2020-12-26 MED ORDER — MIDAZOLAM HCL 2 MG/2ML IJ SOLN
INTRAMUSCULAR | Status: AC
  Filled 2020-12-26: qty 2

## 2020-12-26 MED ORDER — ALTEPLASE 2 MG IJ SOLR: 2.0000 mg | Freq: Once | INTRAMUSCULAR | Status: DC | PRN

## 2020-12-26 MED ORDER — TRAZODONE HCL 50 MG PO TABS
50.0000 mg | ORAL_TABLET | Freq: Every evening | ORAL | Status: DC | PRN
  Administered 2020-12-27 – 2021-01-04 (×5): 50 mg via ORAL
  Filled 2020-12-26 (×5): qty 1

## 2020-12-26 MED ORDER — SODIUM CHLORIDE 0.9 % IV SOLN: 100.0000 mL | INTRAVENOUS | Status: DC | PRN

## 2020-12-26 MED ORDER — FENTANYL CITRATE (PF) 100 MCG/2ML IJ SOLN
INTRAMUSCULAR | Status: AC
  Filled 2020-12-26: qty 2

## 2020-12-26 MED ORDER — HEPARIN SODIUM (PORCINE) 1000 UNIT/ML IJ SOLN
INTRAMUSCULAR | Status: AC
  Administered 2020-12-26: 2.8 mL
  Filled 2020-12-26: qty 1

## 2020-12-26 MED ORDER — ZIDOVUDINE 100 MG PO CAPS
300.0000 mg | ORAL_CAPSULE | Freq: Every day | ORAL | Status: DC
  Administered 2020-12-26 – 2021-01-09 (×14): 300 mg via ORAL
  Filled 2020-12-26 (×15): qty 3

## 2020-12-26 MED ORDER — LIDOCAINE HCL 1 % IJ SOLN
INTRAMUSCULAR | Status: AC
  Filled 2020-12-26: qty 20

## 2020-12-26 MED ORDER — VANCOMYCIN HCL IN DEXTROSE 500-5 MG/100ML-% IV SOLN
500.0000 mg | Freq: Once | INTRAVENOUS | Status: AC
  Administered 2020-12-26: 500 mg via INTRAVENOUS
  Filled 2020-12-26 (×2): qty 100

## 2020-12-26 MED ORDER — CINACALCET HCL 30 MG PO TABS
60.0000 mg | ORAL_TABLET | ORAL | Status: DC
  Administered 2020-12-28 – 2021-01-09 (×6): 60 mg via ORAL
  Filled 2020-12-26 (×7): qty 2

## 2020-12-26 MED ORDER — LAMIVUDINE 10 MG/ML PO SOLN
50.0000 mg | Freq: Every day | ORAL | Status: DC
  Administered 2020-12-26 – 2021-01-09 (×14): 50 mg via ORAL
  Filled 2020-12-26 (×15): qty 5

## 2020-12-26 MED ORDER — LIDOCAINE-PRILOCAINE 2.5-2.5 % EX CREA: 1.0000 "application " | TOPICAL_CREAM | CUTANEOUS | Status: DC | PRN

## 2020-12-26 MED ORDER — ALBUMIN HUMAN 25 % IV SOLN
INTRAVENOUS | Status: AC
  Administered 2020-12-26: 25 g
  Filled 2020-12-26: qty 100

## 2020-12-26 MED ORDER — LIDOCAINE HCL (PF) 1 % IJ SOLN: 5.0000 mL | INTRAMUSCULAR | Status: DC | PRN

## 2020-12-26 MED ORDER — SODIUM BICARBONATE 8.4 % IV SOLN
50.0000 meq | Freq: Once | INTRAVENOUS | Status: AC
  Administered 2020-12-26: 50 meq via INTRAVENOUS
  Filled 2020-12-26: qty 50

## 2020-12-26 MED ORDER — CALCITRIOL 0.5 MCG PO CAPS
1.2500 ug | ORAL_CAPSULE | ORAL | Status: DC
  Administered 2020-12-31 – 2021-01-04 (×3): 1.25 ug via ORAL
  Filled 2020-12-26 (×4): qty 1

## 2020-12-26 MED ORDER — ACETAMINOPHEN 325 MG PO TABS
650.0000 mg | ORAL_TABLET | Freq: Four times a day (QID) | ORAL | Status: DC | PRN
  Administered 2020-12-28 – 2021-01-08 (×5): 650 mg via ORAL
  Filled 2020-12-26 (×5): qty 2

## 2020-12-26 MED ORDER — DARBEPOETIN ALFA 200 MCG/0.4ML IJ SOSY
200.0000 ug | PREFILLED_SYRINGE | INTRAMUSCULAR | Status: DC
  Administered 2020-12-26 – 2021-01-09 (×3): 200 ug via INTRAVENOUS
  Filled 2020-12-26 (×6): qty 0.4

## 2020-12-26 MED ORDER — METOPROLOL TARTRATE 5 MG/5ML IV SOLN
5.0000 mg | INTRAVENOUS | Status: DC | PRN
  Administered 2020-12-28: 5 mg via INTRAVENOUS
  Filled 2020-12-26: qty 5

## 2020-12-26 MED ORDER — HEPARIN SODIUM (PORCINE) 1000 UNIT/ML DIALYSIS
1000.0000 [IU] | INTRAMUSCULAR | Status: DC | PRN
  Filled 2020-12-26: qty 1

## 2020-12-26 MED ORDER — IPRATROPIUM-ALBUTEROL 0.5-2.5 (3) MG/3ML IN SOLN: 3.0000 mL | RESPIRATORY_TRACT | Status: DC | PRN

## 2020-12-26 MED ORDER — SENNOSIDES-DOCUSATE SODIUM 8.6-50 MG PO TABS: 2.0000 | ORAL_TABLET | Freq: Two times a day (BID) | ORAL | Status: DC | PRN

## 2020-12-26 MED ORDER — LIDOCAINE-EPINEPHRINE 1 %-1:100000 IJ SOLN
INTRAMUSCULAR | Status: AC
  Administered 2020-12-26: 5 mL
  Filled 2020-12-26: qty 1

## 2020-12-26 MED ORDER — HYDRALAZINE HCL 20 MG/ML IJ SOLN: 10.0000 mg | INTRAMUSCULAR | Status: DC | PRN

## 2020-12-26 MED ORDER — PENTAFLUOROPROP-TETRAFLUOROETH EX AERO: 1.0000 "application " | INHALATION_SPRAY | CUTANEOUS | Status: DC | PRN

## 2020-12-26 MED ORDER — CEFAZOLIN SODIUM-DEXTROSE 2-4 GM/100ML-% IV SOLN: 2.0000 g | INTRAVENOUS | Status: DC

## 2020-12-26 NOTE — Progress Notes (Signed)
Pt is on dolutegravir, lamivudine, zidovudine for her anti-retroviral. Zidovudine can be dosed as 300mg  PO qday in HD pt. Will change while here.  Onnie Boer, PharmD, BCIDP, AAHIVP, CPP Infectious Disease Pharmacist 12/26/2020 10:25 AM

## 2020-12-26 NOTE — Plan of Care (Signed)
°  Problem: Coping: °Goal: Level of anxiety will decrease °Outcome: Progressing °  °

## 2020-12-26 NOTE — Progress Notes (Signed)
Upper extremity dialysis access ultrasound attempted. Patient being taken to hemodialysis. Will attempt again as schedule and patient availability permits.   Darlin Coco, RDMS, RVT

## 2020-12-26 NOTE — Progress Notes (Addendum)
Pharmacy Antibiotic Note  [name] is a 70 y.o. female admitted on 12/24/2020 with wound infection.  Pharmacy has been consulted on 10/25 for Vancomycin and Cefepime dosing for cellulitis x 7 days.   Vancomycin 1gm loading dose given 10/26.  Currently on appropriate dose of Cefepime 1g IV q24h.   Patient currently in HD now,  HD ordered for 3.5hours, BFR as tolerated to max 400 ml/main.  Weight 46.1 kg  Plan: Give Vancomycin 500mg  IV x1 at end of HD today 12/26/20. Cefepime 1 gm  IV q24hr F/U plans / schedule for  fruther dialysis before additional  Vancomycin doses ordered  Height: 5' 2.01" (157.5 cm) Weight: 46.1 kg (101 lb 10.1 oz) IBW/kg (Calculated) : 50.12  Temp (24hrs), Avg:98.9 F (37.2 C), Min:98.4 F (36.9 C), Max:99.6 F (37.6 C)  Recent Labs  Lab 12/24/20 1518 12/24/20 1658 12/25/20 0409 12/26/20 0508  WBC 9.2  --  8.1 8.8  CREATININE 10.98*  --  11.47* 13.14*  LATICACIDVEN 1.6 1.0  --   --      Estimated Creatinine Clearance: 3 mL/min (A) (by C-G formula based on SCr of 13.14 mg/dL (H)).    Allergies  Allergen Reactions   Lactose Intolerance (Gi) Other (See Comments)    On MAR   Latex Rash and Itching   Penicillins Rash and Swelling    Has patient had a PCN reaction causing immediate rash, facial/tongue/throat swelling, SOB or lightheadedness with hypotension: No Has patient had a PCN reaction causing severe rash involving mucus membranes or skin necrosis: No Has patient had a PCN reaction that required hospitalization No Has patient had a PCN reaction occurring within the last 10 years: No If all of the above answers are "NO", then may proceed with Cephalosporin use.      Antimicrobials this admission: Vanco 10/25 >>  Cefepime 10/25 >>   Dose adjustments this admission:   Microbiology results:  10/25 blood>> ngtd 10/27 Surgical MRSA PCR : neg;  SA: neg  10/25 COVID-19 PCR: neg; flu: neg  Thank you for allowing pharmacy to be a part of  this patient's care.  Nicole Cella, RPh Clinical Pharmacist Please check AMION for all [place] phone numbers After 10:00 PM, call [place] [phone]  12/26/2020 2:59 PM

## 2020-12-26 NOTE — Progress Notes (Signed)
Initial Nutrition Assessment  DOCUMENTATION CODES:  Severe malnutrition in context of chronic illness  INTERVENTION:  Once diet is advanced beyond liquids: -snacks BID -double protein portions with meals -renal MVI daily  If intake unable to progress, recommend Cortrak placement with EN given malnutrition and poor PO intake.  NUTRITION DIAGNOSIS:  Severe Malnutrition related to chronic illness (ESRD on HD) as evidenced by severe fat depletion, severe muscle depletion.  GOAL:  Patient will meet greater than or equal to 90% of their needs  MONITOR:  Diet advancement, PO intake, I & O's, Labs, Weight trends  REASON FOR ASSESSMENT:  Malnutrition Screening Tool    ASSESSMENT:  Patient with PMH significant for chronic diarrhea, ESRD on HD, GERD, HIV, HTN, pulmonary HTN, anxiety/depression, Atrial flutter, and traumatic hematoma of R upper arm s/p recent balloon angioplasty of R subclavian and R innominate veins w/ conversion of R forearm fistula to R upper arm fistula Sept 2022 presented to [facility] ED 12/24/20 c/o progressively worsening R arm pain and was admitted with R AVF infection. Transferred to [facility] for further care and HD treatments.  Per MD, pt is tentatively scheduled today for an image-guided tunneled dialysis catheter and pt to have HD afterwards.   Pt with poor appetite/intake, though denies N/V. 0% meal completion x 2 recorded meals. Pt states that she dislikes Ensure, Magic Cup, Nepro, and Boost supplements. Will trial snacks and double protein portions once diet is advanced beyond liquids. If intake unable to progress, recommend Cortrak placement with EN given malnutrition and poor PO intake.  Weight noted to decline over the last 7 months with the exception of this admission weight of 46.1 kg. However, this weight is likely elevated due to fluid retention/edema and not reflective of true weight/weight loss. Will monitor weight trends once pt is able to receive HD again.    EDW: 41.6 kg Admission/current weight: 46.1 kg   No UOP documented x24 hours I/O: +760ml since admit  Medications: remeron, protonix, IV abx Labs: Recent Labs  Lab 12/24/20 1518 12/25/20 0409 12/26/20 0508  NA 132* 131* 130*  K 5.3* 4.9 6.2*  CL 92* 91* 93*  CO2 23 21* 20*  BUN 87* 93* 103*  CREATININE 10.98* 11.47* 13.14*  CALCIUM 8.9 9.0 9.2  MG 2.2  --   --   GLUCOSE 68* 61* 72  CBGs: 73-159 x24 hours   NUTRITION - FOCUSED PHYSICAL EXAM: Flowsheet Row Most Recent Value  Orbital Region Severe depletion  Upper Arm Region Severe depletion  Thoracic and Lumbar Region Severe depletion  Buccal Region Severe depletion  Temple Region Severe depletion  Clavicle Bone Region Severe depletion  Clavicle and Acromion Bone Region Severe depletion  Scapular Bone Region Severe depletion  Dorsal Hand Severe depletion  Patellar Region Severe depletion  Anterior Thigh Region Severe depletion  Posterior Calf Region Severe depletion  Edema (RD Assessment) None  Hair Reviewed  Eyes Reviewed  Mouth Reviewed  Skin Reviewed  Nails Reviewed      Diet Order:   Diet Order             Diet NPO time specified  Diet effective midnight           Diet NPO time specified  Diet effective now                  EDUCATION NEEDS:  No education needs have been identified at this time  Skin:  Skin Assessment: Reviewed RN Assessment  Last BM:  10/23  Height:  Ht Readings from Last 1 Encounters:  12/24/20 5' 2.01" (1.575 m)   Weight:  Wt Readings from Last 10 Encounters:  12/26/20 46.1 kg  12/18/20 39.9 kg  12/16/20 39.9 kg  11/27/20 41.5 kg  10/25/20 46.4 kg  08/05/20 46.1 kg  07/11/20 44.9 kg  05/15/20 47.2 kg  11/28/19 46.6 kg  11/16/19 44.8 kg   BMI:  Body mass index is 18.58 kg/m.  Estimated Nutritional Needs:  Kcal:  1450-1650 Protein:  70-85 grams Fluid:  1L+UOP    Larkin Ina, MS, RD, LDN (she/her/hers) RD pager number and weekend/on-call pager  number located in [place].

## 2020-12-26 NOTE — Progress Notes (Signed)
Patient ID: [name], female   DOB: [birth year], 70 y.o.   MRN: [id number] S: seen after placement on temp HD catheter-  she looks miserable-  speech is not clear but I think is baseline-  tender everywhere-  appreciate VVS-  plan is for fistulogram tomorrow and possible I and D.  Planning for HD later today    O:BP 120/68 (BP Location: Left Leg)   Pulse 67   Temp 98.9 F (37.2 C) (Oral)   Resp 18   Ht 5' 2.01" (1.575 m)   Wt 46.1 kg   LMP  (LMP Unknown)   SpO2 98%   BMI 18.58 kg/m   Intake/Output Summary (Last 24 hours) at 12/26/2020 1339 Last data filed at 12/26/2020 0841 Gross per 24 hour  Intake 340 ml  Output --  Net 340 ml   Intake/Output: I/O last 3 completed shifts: In: 419.6 [P.O.:240; IV Piggyback:179.6] Out: -   Intake/Output this shift:  No intake/output data recorded. Weight change: 6.183 kg Gen:NAD-  new left temp cath CVS: RRR Resp:CTA Abd: +BS,s oft, NT/ND Ext: RUE AVF +T/B, right arm more edematous and tender with fluctuant area in antecubital region.  Recent Labs  Lab 12/24/20 1518 12/25/20 0409 12/26/20 0508  NA 132* 131* 130*  K 5.3* 4.9 6.2*  CL 92* 91* 93*  CO2 23 21* 20*  GLUCOSE 68* 61* 72  BUN 87* 93* 103*  CREATININE 10.98* 11.47* 13.14*  ALBUMIN 2.9* 2.6*  --   CALCIUM 8.9 9.0 9.2  AST 12* 10*  --   ALT 9 7  --    Liver Function Tests: Recent Labs  Lab 12/24/20 1518 12/25/20 0409  AST 12* 10*  ALT 9 7  ALKPHOS 88 87  BILITOT 0.6 0.9  PROT 7.8 7.1  ALBUMIN 2.9* 2.6*   No results for input(s): LIPASE, AMYLASE in the last 168 hours. No results for input(s): AMMONIA in the last 168 hours. CBC: Recent Labs  Lab 12/24/20 1518 12/25/20 0409 12/26/20 0508  WBC 9.2 8.1 8.8  NEUTROABS 7.6 6.1  --   HGB 7.8* 7.3* 7.4*  HCT 25.7* 23.9* 24.2*  MCV 109.8* 107.7* 103.9*  PLT 205 194 174   Cardiac Enzymes: No results for input(s): CKTOTAL, CKMB, CKMBINDEX, TROPONINI in the last 168 hours. CBG: Recent Labs  Lab  12/25/20 2030 12/26/20 0004 12/26/20 0438 12/26/20 0747 12/26/20 1246  GLUCAP 91 95 73 92 75    Iron Studies: No results for input(s): IRON, TIBC, TRANSFERRIN, FERRITIN in the last 72 hours. Studies/Results: IR Fluoro Guide CV Line Left  Result Date: 12/26/2020 INDICATION: 70 year old female with history of end-stage renal disease in malfunctioning right upper extremity arteriovenous fistula in the setting of right innominate vein stenosis/stent occlusion. Alternate hemodialysis access is required pending central venous intervention. EXAM: NON-TUNNELED CENTRAL VENOUS HEMODIALYSIS CATHETER PLACEMENT WITH ULTRASOUND AND FLUOROSCOPIC GUIDANCE COMPARISON:  None. MEDICATIONS: None FLUOROSCOPY TIME:  0 minutes, 18 seconds (2 mGy) COMPLICATIONS: None immediate. PROCEDURE: Informed written consent was obtained from the patient after a discussion of the risks, benefits, and alternatives to treatment. Questions regarding the procedure were encouraged and answered. The left neck and chest were prepped with chlorhexidine in a sterile fashion, and a sterile drape was applied covering the operative field. Maximum barrier sterile technique with sterile gowns and gloves were used for the procedure. A timeout was performed prior to the initiation of the procedure. After the overlying soft tissues were anesthetized, a small venotomy incision was created and a  micropuncture kit was utilized to access the internal jugular vein. Real-time ultrasound guidance was utilized for vascular access including the acquisition of a permanent ultrasound image documenting patency of the accessed vessel. The microwire was utilized to measure appropriate catheter length. A Rosen wire was advanced to the level of the IVC. Under fluoroscopic guidance, the venotomy was serially dilated, ultimately allowing placement of a 20 cm temporary Trialysis catheter with tip ultimately terminating within the superior aspect of the right atrium. Final  catheter positioning was confirmed and documented with a spot radiographic image. The catheter aspirates and flushes normally. The catheter was flushed with appropriate volume heparin dwells. The catheter exit site was secured with a 0-Silk retention suture. A dressing was placed. The patient tolerated the procedure well without immediate post procedural complication. IMPRESSION: Successful placement of a left internal jugular approach 20 cm temporary dialysis catheter with tip terminating with in the superior aspect of the right atrium. The catheter is ready for immediate use. PLAN: This catheter may be converted to a tunneled dialysis catheter at a later date as indicated. Ruthann Cancer, MD [facility] Electronically Signed   By: Ruthann Cancer M.D.   On: 12/26/2020 12:45   IR US Guide Vasc Access Left  Result Date: 12/26/2020 INDICATION: 70 year old female with history of end-stage renal disease in malfunctioning right upper extremity arteriovenous fistula in the setting of right innominate vein stenosis/stent occlusion. Alternate hemodialysis access is required pending central venous intervention. EXAM: NON-TUNNELED CENTRAL VENOUS HEMODIALYSIS CATHETER PLACEMENT WITH ULTRASOUND AND FLUOROSCOPIC GUIDANCE COMPARISON:  None. MEDICATIONS: None FLUOROSCOPY TIME:  0 minutes, 18 seconds (2 mGy) COMPLICATIONS: None immediate. PROCEDURE: Informed written consent was obtained from the patient after a discussion of the risks, benefits, and alternatives to treatment. Questions regarding the procedure were encouraged and answered. The left neck and chest were prepped with chlorhexidine in a sterile fashion, and a sterile drape was applied covering the operative field. Maximum barrier sterile technique with sterile gowns and gloves were used for the procedure. A timeout was performed prior to the initiation of the procedure. After the overlying soft tissues were  anesthetized, a small venotomy incision was created and a micropuncture kit was utilized to access the internal jugular vein. Real-time ultrasound guidance was utilized for vascular access including the acquisition of a permanent ultrasound image documenting patency of the accessed vessel. The microwire was utilized to measure appropriate catheter length. A Rosen wire was advanced to the level of the IVC. Under fluoroscopic guidance, the venotomy was serially dilated, ultimately allowing placement of a 20 cm temporary Trialysis catheter with tip ultimately terminating within the superior aspect of the right atrium. Final catheter positioning was confirmed and documented with a spot radiographic image. The catheter aspirates and flushes normally. The catheter was flushed with appropriate volume heparin dwells. The catheter exit site was secured with a 0-Silk retention suture. A dressing was placed. The patient tolerated the procedure well without immediate post procedural complication. IMPRESSION: Successful placement of a left internal jugular approach 20 cm temporary dialysis catheter with tip terminating with in the superior aspect of the right atrium. The catheter is ready for immediate use. PLAN: This catheter may be converted to a tunneled dialysis catheter at a later date as indicated. Ruthann Cancer, MD [facility] Electronically Signed   By: Ruthann Cancer M.D.   On: 12/26/2020 12:45   DG Chest Port 1 View  Result Date: 12/24/2020 CLINICAL DATA:  Recent shunt placement, pain EXAM: PORTABLE CHEST 1 VIEW COMPARISON:  04/06/2020 FINDINGS: Gross cardiomegaly and pulmonary vascular prominence. No acute appearing airspace opacity. Right brachiocephalic venous stent. Surgical clips in the right axilla. IMPRESSION: Gross cardiomegaly and pulmonary vascular prominence. No overt edema or acute appearing airspace opacity. Electronically Signed   By: Delanna Ahmadi M.D.   On: 12/24/2020 16:11    Chlorhexidine Gluconate Cloth  6 each Topical Q0600   dolutegravir  50 mg Oral q1600   fentaNYL  1 patch Transdermal Q72H   lamiVUDine  50 mg Oral Daily   methadone  2.5 mg Oral Q2000   metoprolol tartrate  25 mg Oral BID   mirtazapine  15 mg Oral QHS   multivitamin  1 tablet Oral QHS   pantoprazole  40 mg Oral QAC breakfast   vancomycin variable dose per unstable renal function (pharmacist dosing)   Does not apply See admin instructions   venlafaxine XR  150 mg Oral Q breakfast   zidovudine  300 mg Oral Daily    BMET    Component Value Date/Time   NA 130 (L) 12/26/2020 0508   K 6.2 (H) 12/26/2020 0508   CL 93 (L) 12/26/2020 0508   CO2 20 (L) 12/26/2020 0508   GLUCOSE 72 12/26/2020 0508   BUN 103 (H) 12/26/2020 0508   CREATININE 13.14 (H) 12/26/2020 0508   CREATININE 5.66 (H) 12/22/2019 1117   CALCIUM 9.2 12/26/2020 0508   CALCIUM 9.0 03/12/2011 0527   GFRNONAA 3 (L) 12/26/2020 0508   GFRNONAA 5 (L) 02/12/2015 1000   GFRAA 9 (L) 10/22/2019 0105   GFRAA 5 (L) 02/12/2015 1000   CBC    Component Value Date/Time   WBC 8.8 12/26/2020 0508   RBC 2.33 (L) 12/26/2020 0508   HGB 7.4 (L) 12/26/2020 0508   HCT 24.2 (L) 12/26/2020 0508   PLT 174 12/26/2020 0508   MCV 103.9 (H) 12/26/2020 0508   MCH 31.8 12/26/2020 0508   MCHC 30.6 12/26/2020 0508   RDW 17.2 (H) 12/26/2020 0508   LYMPHSABS 1.1 12/25/2020 0409   MONOABS 0.7 12/25/2020 0409   EOSABS 0.1 12/25/2020 0409   BASOSABS 0.0 12/25/2020 0409    Dialysis Orders: Center: [facility]  on TTS . EDW 41.5kg HD Bath 2K/2.5Ca  Time 3 hours Heparin 1000 units bolus then 500 units/hr. 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  appreciate vascular surgery evaluation-  planning on at the very least a  fistulogram and possibly an I and D.   Given IV vanc and cefepime.  ESRD -  Has not had dialysis since 10/18.  Planning on HD today-  then will need again on Fri or Sat-  will need to work around VVS plans  Hypertension/volume  - stable- no BP meds-  Uf as able-   max of 3.5 liters  Anemia  - continue with ESA.    Metabolic bone disease -  continue with calcitriol and sensipar-  I dont see a binder on her med list  Nutrition - renal diet. Hypoglycemia - PRN D50  Hyperkalemia - improved with lokelma.  Gave an amp of bicarb today pre procedure since K was above 6-  2 K bath   [facility] [phone]

## 2020-12-26 NOTE — Progress Notes (Signed)
Pt receives out-pt HD at [facility] on TTS. Pt arrives at 11:00 for 11:15 chair time. Pt admitted from snf . Will follow and assist as needed.  Melven Sartorius Renal Navigator [phone]

## 2020-12-26 NOTE — Consult Note (Signed)
Chief Complaint: Patient was seen in consultation today for  Chief Complaint  Patient presents with   Post-op Problem    Referring Physician(s): Dr. Augustin Coupe  Supervising Physician: Ruthann Cancer  Patient Status: [facility] - In-pt  History of Present Illness: [name] is a 70 y.o. female with a medical history significant for anxiety/depression, atrial flutter (Eliquis), HIV, HTN, and ESRD. She is followed by vascular for vein and AV fistula occlusions and has recently undergone  balloon angioplasty of the right subclavian and right innominate veins with conversion of right forearm fistula to right upper arm fistula September 2022.   She presented to the [facility] ED 12/24/20 complaining of right arm pain that had progressively worsened since her surgery and was found to have an infection in the fistula. She was transferred to [facility] 12/25/20 for a higher level of care including hemodialysis.   Interventional Radiology has been asked to evaluate this patient for an image-guided tunneled dialysis catheter to facilitate her hemodialysis treatment plans until her AVF is safe to use again.   Past Medical History:  Diagnosis Date   Anemia    Anxiety    Atrial flutter ([place]) 02/22/2015   C. difficile colitis 10/10/11   Chronic diarrhea    Chronic pain    Depression    ESRD on hemodialysis [facility])    Dialysis [name]   GERD (gastroesophageal reflux disease)    HIV (human immunodeficiency virus infection) ([place])    Hypertension    Insomnia    Intracranial hemorrhage (HCC)    Muscle weakness (generalized)    Pulmonary HTN (HCC)    Tachycardia    TIA (transient ischemic attack)    [facility] do not have this dx   Traumatic hematoma of right upper arm 02/22/2015    Past Surgical History:  Procedure Laterality Date   A/V FISTULAGRAM N/A 04/17/2016   Procedure: A/V Fistulagram - Right Upper;  Surgeon: Waynetta Sandy, MD;  Location: [place] [facility];  Service: Cardiovascular;  Laterality: N/A;   A/V FISTULAGRAM Right 08/28/2019   Procedure: A/V FISTULAGRAM;  Surgeon: Waynetta Sandy, MD;  Location: [place] [facility];  Service: Cardiovascular;  Laterality: Right;   ABDOMINAL HYSTERECTOMY     BIOPSY  01/02/2020   Procedure: BIOPSY;  Surgeon: Eloise Harman, DO;  [facility];  Service: Endoscopy;;   BIOPSY  07/11/2020   Procedure: BIOPSY;  Surgeon: Irving Copas., MD;  Location: [place];  Service: Gastroenterology;;   BIOPSY THYROID     CARPAL TUNNEL RELEASE Right 11/16/2019   Procedure: CARPAL TUNNEL RELEASE;  Surgeon: Leanora Cover, MD;  Location: [place];  Service: Orthopedics;  Laterality: Right;   COLONOSCOPY WITH PROPOFOL N/A 01/02/2020   Surgeon: Eloise Harman, DO;nonbleeding internal hemorrhoids, diverticulosis, status post biopsies of the entire colon (benign). Due for repeat in 2026.   Dialysis Shunts     previous one removed from left arm and now present one in right arm   DIALYSIS/PERMA CATHETER INSERTION Right 04/17/2016   Procedure: dialysis Catheter Insertion central veinous;  Surgeon: Waynetta Sandy, MD;  Location: [place] [facility];  Service: Cardiovascular;  Laterality: Right;   ESOPHAGOGASTRODUODENOSCOPY  12/15/2010   Rourk: erosive reflux esophagitis, MW tear, hiatal hernia (small), gastritis with no h.pylori, possibly nsaid related   ESOPHAGOGASTRODUODENOSCOPY (EGD) WITH PROPOFOL N/A 07/11/2020    Surgeon: Irving Copas., MD; normal esophagus, 4 cm hiatal hernia, gastritis biopsied (  mild chronic gastritis, negative H. pylori), single duodenal polyp (Brunner's gland hyperplasia), s/p duodenal biopsy benign, duodenal narrowing at the duodenal sweep.   EUS N/A 07/11/2020    Surgeon: Irving Copas., MD;  dilated CBD and common hepatic duct without stones or sludge in the bile duct, stones and biliary sludge in the gallbladder, pancreatic  parenchyma with hyperechoic strands, pancreatic duct dilated with prominent sidebranches in the neck and body and tail, no pancreatic mass though visulalization limited, no ampullary lesion, no malignant appearing lymph node   EXCISION OF BREAST BIOPSY Right 05/04/2012   Procedure: EXCISION OF BREAST BIOPSY;  Surgeon: Donato Heinz, MD;  Location: [facility];  Service: General;  Laterality: Right;  Right Excisional Breast Biopsy   FISTULOGRAM Right 03/26/2014   Procedure: Right Arm Fistulogram with Venoplasty Right Subclavian Vein and Inominate Vein. Debridement Fistula Ulcer;  Surgeon: Conrad Edcouch, MD;  Location: [place];  Service: Vascular;  Laterality: Right;   FISTULOGRAM Right 03/29/2017   Procedure: FISTULOGRAM COMPLEX RIGHT ARM with Balloon angioplasty;  Surgeon: Waynetta Sandy, MD;  Location: [place];  Service: Vascular;  Laterality: Right;   HEMOSTASIS CLIP PLACEMENT  07/11/2020   Procedure: HEMOSTASIS CLIP PLACEMENT;  Surgeon: Irving Copas., MD;  Location: [place];  Service: Gastroenterology;;   IR GENERIC HISTORICAL  05/19/2016   IR REMOVAL TUN CV CATH W/O FL 05/19/2016 Markus Daft, MD [facility]-INTERV RAD   LIGATION OF ARTERIOVENOUS  FISTULA Right 11/12/2020   Procedure: LIGATION OF ARTERIOVENOUS  FISTULA WITH EXCISION OF NECROTIC TISSUE;  Surgeon: Waynetta Sandy, MD;  Location: [place];  Service: Vascular;  Laterality: Right;   PERIPHERAL VASCULAR BALLOON ANGIOPLASTY Right 04/17/2016   Procedure: Peripheral Vascular Balloon Angioplasty;  Surgeon: Waynetta Sandy, MD;  Location: [place] [facility];  Service: Cardiovascular;  Laterality: Right;  Central segment AV Fistula   PERIPHERAL VASCULAR BALLOON ANGIOPLASTY Right 03/14/2018   Procedure: PERIPHERAL VASCULAR BALLOON ANGIOPLASTY;  Surgeon: Waynetta Sandy, MD;  Location: [place] [facility];  Service: Cardiovascular;  Laterality: Right;  subclavian vein   PERIPHERAL VASCULAR BALLOON ANGIOPLASTY  Right 08/28/2019   Procedure: PERIPHERAL VASCULAR BALLOON ANGIOPLASTY;  Surgeon: Waynetta Sandy, MD;  Location: [place] [facility];  Service: Cardiovascular;  Laterality: Right;  upper arm   PERIPHERAL VASCULAR BALLOON ANGIOPLASTY Right 11/11/2020   Procedure: PERIPHERAL VASCULAR BALLOON ANGIOPLASTY;  Surgeon: Waynetta Sandy, MD;  Location: [place] [facility];  Service: Cardiovascular;  Laterality: Right;   PERIPHERAL VASCULAR INTERVENTION Right 03/14/2018   Procedure: PERIPHERAL VASCULAR INTERVENTION;  Surgeon: Waynetta Sandy, MD;  Location: [place] [facility];  Service: Cardiovascular;  Laterality: Right;  subclavian vein   POLYPECTOMY  07/11/2020   Procedure: POLYPECTOMY;  Surgeon: Mansouraty, Telford Nab., MD;  Location: [facility] END[facility];  Service: Gastroenterology;;   REVISON OF ARTERIOVENOUS FISTULA Right 11/12/2020   Procedure: CREATION OF RIGHT ARM ARTERIOVENOUS FISTULA;  Surgeon: Waynetta Sandy, MD;  Location: [place];  Service: Vascular;  Laterality: Right;   SHUNTOGRAM Right 10/19/2013   Procedure: FISTULOGRAM;  Surgeon: Conrad [place], MD;  Location: [facility] CATH LAB;  Service: Cardiovascular;  Laterality: Right;   TONSILLECTOMY     VISCERAL VENOGRAPHY Right 03/14/2018   Procedure: CENTRAL VENOGRAPHY;  Surgeon: Waynetta Sandy, MD;  Location: [place] [facility];  Service: Cardiovascular;  Laterality: Right;  upper ext fistula    Allergies: Lactose intolerance (gi), Latex, and Penicillins  Medications: Prior to Admission medications   Medication Sig Start Date  End Date Taking? Authorizing Provider  apixaban (ELIQUIS) 2.5 MG TABS tablet Take 1 tablet (2.5 mg total) by mouth 2 (two) times daily. 11/27/20  Yes Ulyses Amor, PA-C  baclofen (LIORESAL) 10 MG tablet Take 10 mg by mouth daily as needed for muscle spasms.   Yes [provider]  clopidogrel (PLAVIX) 75 MG tablet Take 1 tablet (75 mg total) by mouth daily with breakfast.  07/14/20  Yes Mansouraty, Telford Nab., MD  collagenase (SANTYL) ointment Apply 1 application topically daily. Apply to sacrum topically every day shift for wound healing   Yes [provider]  dolutegravir (TIVICAY) 50 MG tablet Take 50 mg by mouth daily at 4 PM.   Yes [provider]  Emollient (MINERIN) LOTN Apply 1 application topically every 12 (twelve) hours as needed (dry skin). Apply to feet and ankles   Yes [provider]  fentaNYL (DURAGESIC) 25 MCG/HR Place 1 patch onto the skin every 3 (three) days. 11/27/20  Yes Laurence Slate M, PA-C  lamivudine (EPIVIR) 100 MG tablet Take 50 mg by mouth daily. (0800)   Yes [provider]  lidocaine-prilocaine (EMLA) cream Apply 1 application topically See admin instructions. (0900) Apply to fistula site one hour before dialysis on Tuesday, Thursday, and Saturday.   Yes [provider]  melatonin 3 MG TABS tablet Take 3 mg by mouth at bedtime. (2000)   Yes [provider]  methadone (DOLOPHINE) 5 MG tablet Take 0.5 tablets (2.5 mg total) by mouth daily at 8 pm. (2000) 11/27/20  Yes Ulyses Amor, PA-C  metoprolol tartrate (LOPRESSOR) 25 MG tablet Take 1 tablet (25 mg total) by mouth 2 (two) times daily. 11/27/20  Yes Ulyses Amor, PA-C  mirtazapine (REMERON) 15 MG tablet Take 15 mg by mouth at bedtime. 12/05/20  Yes [provider]  Multiple Vitamin (MULTIVITAMIN WITH MINERALS) TABS tablet Take 1 tablet by mouth at bedtime. (2000)   Yes [provider]  nitroGLYCERIN (NITROSTAT) 0.4 MG SL tablet Place 0.4 mg under the tongue every 5 (five) minutes x 3 doses as needed for chest pain.    Yes [provider]  ondansetron (ZOFRAN ODT) 8 MG disintegrating tablet Take 1 tablet (8 mg total) by mouth every 8 (eight) hours as needed for nausea or vomiting. 12/16/20  Yes Erenest Rasher, PA-C  Oxycodone HCl 10 MG TABS Take 1 tablet (10 mg total) by mouth every 12 (twelve) hours as  needed (severe pain). 11/27/20  Yes Laurence Slate M, PA-C  pantoprazole (PROTONIX) 40 MG tablet Take 1 tablet (40 mg total) by mouth daily before breakfast. 12/16/20  Yes Erenest Rasher, PA-C  promethazine (PHENERGAN) 12.5 MG tablet Take 12.5 mg by mouth See admin instructions. Take 1 tablet (12.5 mg) by mouth in the morning every Tuesday, Thursday and Saturday for nausea/vomiting and every 6 hours as needed for n/v   Yes [provider]  senna (SENOKOT) 8.6 MG tablet Take 2 tablets by mouth every evening. (2100)   Yes [provider]  venlafaxine XR (EFFEXOR-XR) 150 MG 24 hr capsule Take 150 mg by mouth daily with breakfast. (0900)   Yes [provider]  vitamin C (ASCORBIC ACID) 500 MG tablet Take 500 mg by mouth 2 (two) times daily.   Yes [provider]  zidovudine (RETROVIR) 100 MG capsule Take 100 mg by mouth 3 (three) times daily. (0900, 1300, & 2100)   Yes [provider]  Zinc Sulfate (ZINC-220 PO) Take 1  tablet by mouth daily.   Yes [provider]  Amino Acids-Protein Hydrolys (PRO-STAT 64 PO) Take by mouth. Give 28ml by mouth in the morning for wound healing    [provider]  cyanocobalamin (,VITAMIN B-12,) 1000 MCG/ML injection Inject 1,000 mcg into the muscle every 30 (thirty) days. On the 28th of every month    [provider]  Lidocaine HCl 4 % CREA Apply 1 application topically every 12 (twelve) hours as needed (pain).    [provider]  mirtazapine (REMERON) 7.5 MG tablet Take 7.5 mg by mouth at bedtime. (2100) Patient not taking: Reported on 12/24/2020    [provider]  ondansetron (ZOFRAN) 8 MG tablet Take 8 mg by mouth 3 (three) times daily. Patient not taking: Reported on 12/24/2020 12/16/20   [provider]     Family History  Problem Relation Age of Onset   Anesthesia problems Neg Hx    Hypotension Neg Hx    Malignant hyperthermia Neg Hx    Pseudochol deficiency Neg  Hx    Colon cancer Neg Hx        Unsure of parents who both died when she was a baby    Social History   Socioeconomic History   Marital status: Widowed    Spouse name: Not on file   Number of children: 1   Years of education: Not on file   Highest education level: Not on file  Occupational History   Not on file  Tobacco Use   Smoking status: Former    Types: Cigarettes    Quit date: 03/12/1983    Years since quitting: 37.8   Smokeless tobacco: Never  Vaping Use   Vaping Use: Never used  Substance and Sexual Activity   Alcohol use: No    Alcohol/week: 0.0 standard drinks   Drug use: No   Sexual activity: Not Currently    Birth control/protection: Surgical    Comment: Hysterectomy  Other Topics Concern   Not on file  Social History Narrative   Not on file   Social Determinants of Health   Financial Resource Strain: Not on file  Food Insecurity: Not on file  Transportation Needs: Not on file  Physical Activity: Not on file  Stress: Not on file  Social Connections: Not on file    Review of Systems: A 12 point ROS discussed and pertinent positives are indicated in the HPI above.  All other systems are negative.  Review of Systems  Constitutional:  Positive for fatigue. Negative for appetite change.  Respiratory:  Negative for cough and shortness of breath.   Cardiovascular:  Negative for chest pain and leg swelling.       Right arm pain and swelling   Gastrointestinal:  Negative for abdominal pain, diarrhea, nausea and vomiting.  Neurological:  Negative for dizziness and headaches.   Vital Signs: BP 131/64 (BP Location: Left Leg)   Pulse 74   Temp 99 F (37.2 C) (Oral)   Resp 18   Ht 5' 2.01" (1.575 m)   Wt 101 lb 10.1 oz (46.1 kg)   LMP  (LMP Unknown)   SpO2 99%   BMI 18.58 kg/m   Physical Exam Constitutional:      General: She is not in acute distress.    Appearance: She is ill-appearing.  HENT:     Ears:     Comments: Hard of hearing     Mouth/Throat:     Mouth: Mucous membranes are dry.  Pharynx: Oropharynx is clear.     Comments: edentulous Cardiovascular:     Rate and Rhythm: Normal rate and regular rhythm.     Comments: Prior left arm AVF. Current right arm AVF.  Abdominal:     General: Bowel sounds are normal.     Palpations: Abdomen is soft.     Tenderness: There is no abdominal tenderness.  Musculoskeletal:        General: Swelling and tenderness present.     Right lower leg: No edema.     Left lower leg: No edema.     Comments: Right arm/hand  Skin:    General: Skin is warm and dry.     Comments: Right forearm wrapped in gauze  Neurological:     Mental Status: She is alert and oriented to person, place, and time.    Imaging: DG Chest Port 1 View  Result Date: 12/24/2020 CLINICAL DATA:  Recent shunt placement, pain EXAM: PORTABLE CHEST 1 VIEW COMPARISON:  04/06/2020 FINDINGS: Gross cardiomegaly and pulmonary vascular prominence. No acute appearing airspace opacity. Right brachiocephalic venous stent. Surgical clips in the right axilla. IMPRESSION: Gross cardiomegaly and pulmonary vascular prominence. No overt edema or acute appearing airspace opacity. Electronically Signed   By: Delanna Ahmadi M.D.   On: 12/24/2020 16:11    Labs:  CBC: Recent Labs    11/27/20 0135 12/24/20 1518 12/25/20 0409 12/26/20 0508  WBC 4.7 9.2 8.1 8.8  HGB 9.5* 7.8* 7.3* 7.4*  HCT 31.2* 25.7* 23.9* 24.2*  PLT 260 205 194 174    COAGS: No results for input(s): INR, APTT in the last 8760 hours.  BMP: Recent Labs    11/26/20 0732 12/24/20 1518 12/25/20 0409 12/26/20 0508  NA 131* 132* 131* 130*  K 3.7 5.3* 4.9 6.2*  CL 93* 92* 91* 93*  CO2 23 23 21* 20*  GLUCOSE 75 68* 61* 72  BUN 44* 87* 93* 103*  CALCIUM 9.8 8.9 9.0 9.2  CREATININE 8.83* 10.98* 11.47* 13.14*  GFRNONAA 5* 3* 3* 3*    LIVER FUNCTION TESTS: Recent Labs    07/11/20 0835 11/14/20 1000 11/21/20 0935 11/21/20 1330 11/23/20 1235  11/26/20 0732 12/24/20 1518 12/25/20 0409  BILITOT 0.8  --  0.6  --   --   --  0.6 0.9  AST 27  --  20  --   --   --  12* 10*  ALT 6  --  11  --   --   --  9 7  ALKPHOS 82  --  83  --   --   --  88 87  PROT 7.9  --  8.5*  --   --   --  7.8 7.1  ALBUMIN 3.5   < > 3.2*   < > 2.9* 3.1* 2.9* 2.6*   < > = values in this interval not displayed.    TUMOR MARKERS: No results for input(s): AFPTM, CEA, CA199, CHROMGRNA in the last 8760 hours.  Assessment and Plan:  Right AVF infection; ESRD on Hemodialysis: [name], 70 year old female, is tentatively scheduled today for an image-guided tunneled dialysis catheter. Her potassium is 6.2 and the nephrology team is ordering bicarb to bring that level down so IR can safely sedate/perform procedure.   Risks and benefits discussed with the patient including, but not limited to bleeding, infection, vascular injury, pneumothorax which may require chest tube placement, air embolism or even death  All of the patient's questions were answered, patient is  agreeable to proceed. She has been NPO.   Consent signed and in chart.   Thank you for this interesting consult.  I greatly enjoyed meeting ZANDRA LAJEUNESSE and look forward to participating in their care.  A copy of this report was sent to the requesting provider on this date.  Electronically Signed: Soyla Dryer, AGACNP-BC [phone] 12/26/2020, 9:09 AM   I spent a total of 20 Minutes    in face to face in clinical consultation, greater than 50% of which was counseling/coordinating care for tunneled dialysis catheter

## 2020-12-26 NOTE — Progress Notes (Signed)
PROGRESS NOTE    [name]  IRJ:[id number] DOB: 1950-07-29 DOA: 12/24/2020 PCP: Hilbert Corrigan, MD   Brief Narrative:  70 year old with history of ESRD on HD Tuesday Thursday Saturday, HIV, a flutter on Eliquis comes to the hospital with complaints of right upper extremity AV fistula infection.  She had an AV fistula occlusion about 4 weeks ago and underwent balloon angioplasty of the right subclavian and right innominate vein with conversion to right forearm fistula to right upper extremity fistula.  She initially went to [facility] where she was diagnosed with AV fistula infection and transferred to [facility].  Vascular, nephro, IR and infectious disease are consulted.   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  ESRD on hemodialysis TTS - Nephrology team following, IR plans on putting temporary catheter in  Hyperkalemia - Bicarb push ordered by nephrology.  Atrial flutter - Heart rate is well controlled.  Chronically on Eliquis, on hold  History of HIV - CD4 count 252, continue antiretrovirals  Anemia of chronic disease - Baseline hemoglobin 8.0.  Continue to monitor  History of chronic pain syndrome - On fentanyl patch and methadone    DVT prophylaxis: SCDs Start: 12/24/20 1728 Code Status: Full Family Communication:  None  Status is: Inpatient  Remains inpatient appropriate because: on going eval for AVF infection. Plans for OR tomorrow.         Nutritional status           Body mass index is 18.58 kg/m.  Pressure Injury 10/21/19 Sacrum (Active)  10/21/19 1807  Location: Sacrum  Location Orientation:   Staging:   Wound Description (Comments):   Present on Admission:           Subjective: Feels ok no new complaints. Still has swelling and pain her RUE  Review of Systems Otherwise negative except as per HPI, including: General: Denies fever, chills, night sweats or unintended weight loss. Resp: Denies cough, wheezing, shortness of breath. Cardiac: Denies chest pain, palpitations, orthopnea, paroxysmal nocturnal dyspnea. GI: Denies abdominal pain, nausea, vomiting, diarrhea or constipation GU: Denies dysuria, frequency, hesitancy or incontinence MS: Denies muscle aches, joint pain or swelling Neuro: Denies headache, neurologic deficits (focal weakness, numbness, tingling), abnormal gait Psych: Denies anxiety, depression, SI/HI/AVH Skin: Denies new rashes or lesions ID: Denies sick contacts, exotic exposures, travel  Examination:  General exam: Appears calm and comfortable  Respiratory system: Clear to auscultation. Respiratory effort normal. Cardiovascular system: S1 & S2 heard, RRR. No JVD, murmurs, rubs, gallops or clicks. No pedal edema. Gastrointestinal system: Abdomen is nondistended, soft and nontender. No organomegaly or masses felt. Normal bowel sounds heard. Central nervous system: Alert and oriented. No focal neurological deficits. Extremities: Symmetric 5 x 5 power. Skin: RUE wounds are healing from recent surgery, slightly warm to touch and some fluctuant area.  Psychiatry: Judgement and insight appear normal. Mood & affect appropriate.     Objective: Vitals:   12/26/20 0004 12/26/20 0428 12/26/20 0748 12/26/20 1020  BP: (!) 144/64 (!) 102/53 131/64 122/65  Pulse: 81 70 74 72  Resp: 18 18 18    Temp: 98.5 F (36.9 C) 98.9 F (37.2 C) 99 F (37.2 C)   TempSrc: Oral Oral  Oral   SpO2: 96% 99% 99% 100%  Weight:  46.1 kg    Height:        Intake/Output Summary (Last 24 hours) at 12/26/2020 1044 Last data filed at 12/26/2020 0841 Gross per 24 hour  Intake 340 ml  Output --  Net 340 ml   Filed Weights   12/24/20 1234 12/25/20 1619 12/26/20 0428  Weight: 39.9 kg 46.1 kg 46.1 kg     Data Reviewed:   CBC: Recent Labs  Lab 12/24/20 1518 12/25/20 0409 12/26/20 0508  WBC 9.2 8.1 8.8  NEUTROABS 7.6 6.1  --   HGB 7.8* 7.3* 7.4*  HCT 25.7* 23.9* 24.2*  MCV 109.8* 107.7* 103.9*  PLT 205 194 096   Basic Metabolic Panel: Recent Labs  Lab 12/24/20 1518 12/25/20 0409 12/26/20 0508  NA 132* 131* 130*  K 5.3* 4.9 6.2*  CL 92* 91* 93*  CO2 23 21* 20*  GLUCOSE 68* 61* 72  BUN 87* 93* 103*  CREATININE 10.98* 11.47* 13.14*  CALCIUM 8.9 9.0 9.2  MG 2.2  --   --    GFR: Estimated Creatinine Clearance: 3 mL/min (A) (by C-G formula based on SCr of 13.14 mg/dL (H)). Liver Function Tests: Recent Labs  Lab 12/24/20 1518 12/25/20 0409  AST 12* 10*  ALT 9 7  ALKPHOS 88 87  BILITOT 0.6 0.9  PROT 7.8 7.1  ALBUMIN 2.9* 2.6*   No results for input(s): LIPASE, AMYLASE in the last 168 hours. No results for input(s): AMMONIA in the last 168 hours. Coagulation Profile: No results for input(s): INR, PROTIME in the last 168 hours. Cardiac Enzymes: No results for input(s): CKTOTAL, CKMB, CKMBINDEX, TROPONINI in the last 168 hours. BNP (last 3 results) No results for input(s): PROBNP in the last 8760 hours. HbA1C: No results for input(s): HGBA1C in the last 72 hours. CBG: Recent Labs  Lab 12/25/20 1803 12/25/20 2030 12/26/20 0004 12/26/20 0438 12/26/20 0747  GLUCAP 159* 91 95 73 92   Lipid Profile: No results for input(s): CHOL, HDL, LDLCALC, TRIG, CHOLHDL, LDLDIRECT in the last 72 hours. Thyroid Function Tests: No results for input(s): TSH, T4TOTAL, FREET4, T3FREE, THYROIDAB in the last 72 hours. Anemia Panel: No results for input(s):  VITAMINB12, FOLATE, FERRITIN, TIBC, IRON, RETICCTPCT in the last 72 hours. Sepsis Labs: Recent Labs  Lab 12/24/20 1518 12/24/20 1658  LATICACIDVEN 1.6 1.0    Recent Results (from the past 240 hour(s))  Resp Panel by RT-PCR (Flu A&B, Covid) Nasopharyngeal Swab     Status: None   Collection Time: 12/24/20  2:53 PM   Specimen: Nasopharyngeal Swab; Nasopharyngeal(NP) swabs in vial transport medium  Result Value Ref Range Status   SARS Coronavirus 2 by RT PCR NEGATIVE NEGATIVE Final    Comment: (NOTE) SARS-CoV-2 target nucleic acids are NOT DETECTED.  The SARS-CoV-2 RNA is generally detectable in upper respiratory specimens during the acute phase of infection. The lowest concentration of SARS-CoV-2 viral copies this assay can detect is 138 copies/mL. A negative result does not preclude SARS-Cov-2 infection and should not be used as the sole basis for treatment or other patient management decisions. A negative result may occur with  improper specimen collection/handling, submission of specimen other than nasopharyngeal swab, presence of viral mutation(s) within the areas targeted by this assay, and inadequate number of  viral copies(<138 copies/mL). A negative result must be combined with clinical observations, patient history, and epidemiological information. The expected result is Negative.  Fact Sheet for Patients:  EntrepreneurPulse.com.au  Fact Sheet for Healthcare Providers:  IncredibleEmployment.be  This test is no t yet approved or cleared by the [place] FDA and  has been authorized for detection and/or diagnosis of SARS-CoV-2 by FDA under an Emergency Use Authorization (EUA). This EUA will remain  in effect (meaning this test can be used) for the duration of the COVID-19 declaration under Section 564(b)(1) of the Act, 21 U.S.C.section 360bbb-3(b)(1), unless the authorization is terminated  or revoked sooner.       Influenza A  by PCR NEGATIVE NEGATIVE Final   Influenza B by PCR NEGATIVE NEGATIVE Final    Comment: (NOTE) The Xpert Xpress SARS-CoV-2/FLU/RSV plus assay is intended as an aid in the diagnosis of influenza from Nasopharyngeal swab specimens and should not be used as a sole basis for treatment. Nasal washings and aspirates are unacceptable for Xpert Xpress SARS-CoV-2/FLU/RSV testing.  Fact Sheet for Patients: EntrepreneurPulse.com.au  Fact Sheet for Healthcare Providers: IncredibleEmployment.be  This test is not yet approved or cleared by the [place] FDA and has been authorized for detection and/or diagnosis of SARS-CoV-2 by FDA under an Emergency Use Authorization (EUA). This EUA will remain in effect (meaning this test can be used) for the duration of the COVID-19 declaration under Section 564(b)(1) of the Act, 21 U.S.C. section 360bbb-3(b)(1), unless the authorization is terminated or revoked.  Performed at [facility], [address] [place], [place] [address]   Blood culture (routine x 2)     Status: None (Preliminary result)   Collection Time: 12/24/20  3:19 PM   Specimen: BLOOD LEFT HAND  Result Value Ref Range Status   Specimen Description BLOOD LEFT HAND  Final   Special Requests   Final    BOTTLES DRAWN AEROBIC AND ANAEROBIC Blood Culture adequate volume   Culture   Final    NO GROWTH 2 DAYS Performed at [facility], [address], [place], [place] [address]    Report Status PENDING  Incomplete  Blood culture (routine x 2)     Status: None (Preliminary result)   Collection Time: 12/24/20  3:19 PM   Specimen: BLOOD LEFT ARM  Result Value Ref Range Status   Specimen Description BLOOD LEFT ARM  Final   Special Requests   Final    BOTTLES DRAWN AEROBIC AND ANAEROBIC Blood Culture adequate volume   Culture   Final    NO GROWTH 2 DAYS Performed at [facility], [address] [place], [place] [address]    Report Status PENDING   Incomplete         Radiology Studies: DG Chest Port 1 View  Result Date: 12/24/2020 CLINICAL DATA:  Recent shunt placement, pain EXAM: PORTABLE CHEST 1 VIEW COMPARISON:  04/06/2020 FINDINGS: Gross cardiomegaly and pulmonary vascular prominence. No acute appearing airspace opacity. Right brachiocephalic venous stent. Surgical clips in the right axilla. IMPRESSION: Gross cardiomegaly and pulmonary vascular prominence. No overt edema or acute appearing airspace opacity. Electronically Signed   By: Delanna Ahmadi M.D.   On: 12/24/2020 16:11        Scheduled Meds:  Chlorhexidine Gluconate Cloth  6 each Topical Q0600   dolutegravir  50 mg Oral q1600   fentaNYL  1 patch Transdermal Q72H   lamivudine  50 mg Oral Daily   methadone  2.5 mg Oral Q2000   metoprolol  tartrate  25 mg Oral BID   mirtazapine  15 mg Oral QHS   pantoprazole  40 mg Oral QAC breakfast   vancomycin variable dose per unstable renal function (pharmacist dosing)   Does not apply See admin instructions   venlafaxine XR  150 mg Oral Q breakfast   zidovudine  300 mg Oral Daily   Continuous Infusions:  [START ON 12/27/2020]  ceFAZolin (ANCEF) IV     ceFEPime (MAXIPIME) IV Stopped (12/25/20 2133)     LOS: 2 days   Time spent= 35 mins    Rayley Gao Arsenio Loader, MD [facility]  If 7PM-7AM, please contact night-coverage  12/26/2020, 10:44 AM

## 2020-12-26 NOTE — Procedures (Signed)
Interventional Radiology Procedure Note  Procedure: Temporary hemodialysis catheter placement  Findings: Please refer to procedural dictation for full description. Decision made to place temp HD due to need for additional vascular access and hyperkalemia.  20 cm Trialysis placed via left IJ.  Tip in right atrium.  Complications: None immediate  Estimated Blood Loss: < 5 mL  Recommendations: Catheter ready for immediate use.  May convert to tunneled HD once hyperkalemia resolves.   Ruthann Cancer, MD Pager: [phone]

## 2020-12-26 NOTE — Progress Notes (Addendum)
  Postoperative hemodialysis access     Date of Surgery:  11/11/2020 and 11/12/2020 Surgeon: Donzetta Matters  Subjective:  c/o pain in her right around around the elbow and mid forearm.  PHYSICAL EXAMINATION:  Vitals:   12/26/20 0004 12/26/20 0428  BP: (!) 144/64 (!) 102/53  Pulse: 81 70  Resp: 18 18  Temp: 98.5 F (36.9 C) 98.9 F (37.2 C)  SpO2: 96% 99%    Incision is healing;  wound on forearm almost healed.  Sensation in digits is intact; decreased motor in right hand but not much different from when I saw her last.  There is  Thrill      ASSESSMENT/PLAN:  [name] is a 70 y.o. year old female who is s/p  Drug-coated balloon angioplasty right subclavian and right innominate veins with 10 x 60 mm Lutonix on 11/11/2020 and 1.  Ligation and resection of right arm radiocephalic AV fistula  2.  Turndown of upper arm cephalic vein to brachial artery creation of brachial cephalic AV fistula on 08/04/5407 by Dr. Donzetta Matters  -pt scheduled for [facility] today.  Nutrition brought tray into the room.  I discussed with pt not to eat and told the RN.   -wound on forearm almost healed.  Keep dry dressing to this daily.  -pt with continued pain and swelling of right arm.  Dr. Donzetta Matters to see pt today to determine pt needs.  Elevate right arm to help with swelling.    Leontine Locket, PA-C [facility] [phone]   I have independently interviewed patient and agree with PA assessment and plan above.  Total dialysis catheter with interventional radiology today is much appreciated and I have discussed the case with them.  We will plan for right upper extremity fistulogram tomorrow and possibly I&D of right forearm given concern of fluctuant area in the antecubital space.  Nickholas Goldston C. Donzetta Matters, MD [facility] [place] Office: [phone] Pager: [phone]

## 2020-12-27 ENCOUNTER — Ambulatory Visit: Payer: Medicaid Other | Admitting: Internal Medicine

## 2020-12-27 ENCOUNTER — Encounter (HOSPITAL_COMMUNITY): Payer: Medicare Other

## 2020-12-27 ENCOUNTER — Encounter (HOSPITAL_COMMUNITY)
Admission: EM | Disposition: A | Discharge status: Skilled Nursing Facility | Payer: Self-pay | Source: Home / Self Care | Attending: Internal Medicine

## 2020-12-27 ENCOUNTER — Encounter (HOSPITAL_COMMUNITY): Payer: Self-pay | Admitting: Family Medicine

## 2020-12-27 ENCOUNTER — Inpatient Hospital Stay (HOSPITAL_COMMUNITY): Payer: Medicare Other | Admitting: Certified Registered"

## 2020-12-27 DIAGNOSIS — I10 Essential (primary) hypertension: Secondary | ICD-10-CM | POA: Diagnosis not present

## 2020-12-27 DIAGNOSIS — Z992 Dependence on renal dialysis: Secondary | ICD-10-CM | POA: Diagnosis not present

## 2020-12-27 DIAGNOSIS — L089 Local infection of the skin and subcutaneous tissue, unspecified: Secondary | ICD-10-CM | POA: Diagnosis not present

## 2020-12-27 DIAGNOSIS — B20 Human immunodeficiency virus [HIV] disease: Secondary | ICD-10-CM | POA: Diagnosis not present

## 2020-12-27 DIAGNOSIS — T82856A Stenosis of peripheral vascular stent, initial encounter: Secondary | ICD-10-CM | POA: Diagnosis not present

## 2020-12-27 DIAGNOSIS — N186 End stage renal disease: Secondary | ICD-10-CM | POA: Diagnosis not present

## 2020-12-27 DIAGNOSIS — T827XXA Infection and inflammatory reaction due to other cardiac and vascular devices, implants and grafts, initial encounter: Secondary | ICD-10-CM | POA: Diagnosis not present

## 2020-12-27 DIAGNOSIS — D638 Anemia in other chronic diseases classified elsewhere: Secondary | ICD-10-CM | POA: Diagnosis not present

## 2020-12-27 HISTORY — PX: LIGATION OF ARTERIOVENOUS  FISTULA: SHX5948

## 2020-12-27 HISTORY — PX: INCISION AND DRAINAGE: SHX5863

## 2020-12-27 LAB — BASIC METABOLIC PANEL
Anion gap: 12 (ref 5–15)
BUN: 30 mg/dL — ABNORMAL HIGH (ref 8–23)
CO2: 27 mmol/L (ref 22–32)
Calcium: 9.4 mg/dL (ref 8.9–10.3)
Chloride: 98 mmol/L (ref 98–111)
Creatinine, Ser: 6.26 mg/dL — ABNORMAL HIGH (ref 0.44–1.00)
GFR, Estimated: 7 mL/min — ABNORMAL LOW (ref >60)
Glucose, Bld: 89 mg/dL (ref 70–99)
Potassium: 4 mmol/L (ref 3.5–5.1)
Sodium: 137 mmol/L (ref 135–145)

## 2020-12-27 LAB — GLUCOSE, CAPILLARY
Glucose-Capillary: 78 mg/dL (ref 70–99)
Glucose-Capillary: 87 mg/dL (ref 70–99)
Glucose-Capillary: 98 mg/dL (ref 70–99)

## 2020-12-27 LAB — HEMOGLOBIN AND HEMATOCRIT, BLOOD
HCT: 26.7 % — ABNORMAL LOW (ref 36.0–46.0)
Hemoglobin: 8.2 g/dL — ABNORMAL LOW (ref 12.0–15.0)

## 2020-12-27 LAB — CBC
HCT: 21.6 % — ABNORMAL LOW (ref 36.0–46.0)
Hemoglobin: 6.6 g/dL — CL (ref 12.0–15.0)
MCH: 31.6 pg (ref 26.0–34.0)
MCHC: 30.6 g/dL (ref 30.0–36.0)
MCV: 103.3 fL — ABNORMAL HIGH (ref 80.0–100.0)
Platelets: 164 10*3/uL (ref 150–400)
RBC: 2.09 MIL/uL — ABNORMAL LOW (ref 3.87–5.11)
RDW: 16.6 % — ABNORMAL HIGH (ref 11.5–15.5)
WBC: 5.9 10*3/uL (ref 4.0–10.5)
nRBC: 0 % (ref 0.0–0.2)

## 2020-12-27 LAB — PREPARE RBC (CROSSMATCH)

## 2020-12-27 LAB — HEPATITIS B SURFACE ANTIGEN: Hepatitis B Surface Ag: NONREACTIVE

## 2020-12-27 LAB — MAGNESIUM: Magnesium: 2 mg/dL (ref 1.7–2.4)

## 2020-12-27 SURGERY — INCISION AND DRAINAGE
Anesthesia: General | Laterality: Right

## 2020-12-27 MED ORDER — LIDOCAINE 2% (20 MG/ML) 5 ML SYRINGE
INTRAMUSCULAR | Status: AC
  Filled 2020-12-27: qty 5

## 2020-12-27 MED ORDER — PHENYLEPHRINE HCL-NACL 20-0.9 MG/250ML-% IV SOLN
INTRAVENOUS | Status: DC | PRN
  Administered 2020-12-27: 20 ug/min via INTRAVENOUS

## 2020-12-27 MED ORDER — VANCOMYCIN HCL IN DEXTROSE 1-5 GM/200ML-% IV SOLN
INTRAVENOUS | Status: AC
  Filled 2020-12-27: qty 200

## 2020-12-27 MED ORDER — ORAL CARE MOUTH RINSE: 15.0000 mL | Freq: Once | OROMUCOSAL | Status: DC

## 2020-12-27 MED ORDER — ONDANSETRON HCL 4 MG/2ML IJ SOLN
INTRAMUSCULAR | Status: AC
  Filled 2020-12-27: qty 2

## 2020-12-27 MED ORDER — SODIUM CHLORIDE 0.9% IV SOLUTION: Freq: Once | INTRAVENOUS | Status: DC

## 2020-12-27 MED ORDER — ONDANSETRON HCL 4 MG/2ML IJ SOLN
INTRAMUSCULAR | Status: DC | PRN
  Administered 2020-12-27: 4 mg via INTRAVENOUS

## 2020-12-27 MED ORDER — PROPOFOL 10 MG/ML IV BOLUS
INTRAVENOUS | Status: AC
  Filled 2020-12-27: qty 20

## 2020-12-27 MED ORDER — SODIUM CHLORIDE 0.9 % IV SOLN: INTRAVENOUS | Status: DC | PRN

## 2020-12-27 MED ORDER — ONDANSETRON HCL 4 MG/2ML IJ SOLN: 4.0000 mg | Freq: Four times a day (QID) | INTRAMUSCULAR | Status: DC | PRN

## 2020-12-27 MED ORDER — CHLORHEXIDINE GLUCONATE 0.12 % MT SOLN: 15.0000 mL | Freq: Once | OROMUCOSAL | Status: DC

## 2020-12-27 MED ORDER — CHLORHEXIDINE GLUCONATE 0.12 % MT SOLN
OROMUCOSAL | Status: AC
  Filled 2020-12-27: qty 15

## 2020-12-27 MED ORDER — FENTANYL CITRATE (PF) 250 MCG/5ML IJ SOLN
INTRAMUSCULAR | Status: DC | PRN
  Administered 2020-12-27 (×2): 25 ug via INTRAVENOUS
  Administered 2020-12-27: 50 ug via INTRAVENOUS
  Administered 2020-12-27 (×2): 25 ug via INTRAVENOUS
  Administered 2020-12-27: 50 ug via INTRAVENOUS
  Administered 2020-12-27 (×2): 25 ug via INTRAVENOUS

## 2020-12-27 MED ORDER — OXYCODONE HCL 5 MG PO TABS: 5.0000 mg | ORAL_TABLET | Freq: Once | ORAL | Status: DC | PRN

## 2020-12-27 MED ORDER — HYDROMORPHONE HCL 1 MG/ML IJ SOLN
0.2500 mg | INTRAMUSCULAR | Status: DC | PRN
  Administered 2020-12-27 (×4): 0.5 mg via INTRAVENOUS

## 2020-12-27 MED ORDER — HYDROMORPHONE HCL 1 MG/ML IJ SOLN
INTRAMUSCULAR | Status: AC
  Filled 2020-12-27: qty 1

## 2020-12-27 MED ORDER — MIDAZOLAM HCL 2 MG/2ML IJ SOLN
INTRAMUSCULAR | Status: AC
  Filled 2020-12-27: qty 2

## 2020-12-27 MED ORDER — OXYCODONE HCL 5 MG/5ML PO SOLN: 5.0000 mg | Freq: Once | ORAL | Status: DC | PRN

## 2020-12-27 MED ORDER — LACTATED RINGERS IV SOLN: INTRAVENOUS | Status: DC

## 2020-12-27 MED ORDER — FENTANYL CITRATE (PF) 100 MCG/2ML IJ SOLN
INTRAMUSCULAR | Status: AC
  Filled 2020-12-27: qty 2

## 2020-12-27 MED ORDER — PROPOFOL 10 MG/ML IV BOLUS
INTRAVENOUS | Status: DC | PRN
  Administered 2020-12-27: 70 mg via INTRAVENOUS

## 2020-12-27 MED ORDER — VASOPRESSIN 20 UNIT/ML IV SOLN
INTRAVENOUS | Status: AC
  Filled 2020-12-27: qty 1

## 2020-12-27 MED ORDER — LIDOCAINE 2% (20 MG/ML) 5 ML SYRINGE
INTRAMUSCULAR | Status: DC | PRN
  Administered 2020-12-27: 40 mg via INTRAVENOUS

## 2020-12-27 MED ORDER — SODIUM CHLORIDE 0.9% IV SOLUTION: Freq: Once | INTRAVENOUS | Status: AC

## 2020-12-27 MED ORDER — EPHEDRINE 5 MG/ML INJ
INTRAVENOUS | Status: AC
  Filled 2020-12-27: qty 5

## 2020-12-27 MED ORDER — VANCOMYCIN HCL IN DEXTROSE 1-5 GM/200ML-% IV SOLN
1000.0000 mg | INTRAVENOUS | Status: AC
  Administered 2020-12-27: 1000 mg via INTRAVENOUS

## 2020-12-27 MED ORDER — 0.9 % SODIUM CHLORIDE (POUR BTL) OPTIME
TOPICAL | Status: DC | PRN
  Administered 2020-12-27: 1000 mL

## 2020-12-27 MED ORDER — FENTANYL CITRATE (PF) 100 MCG/2ML IJ SOLN
25.0000 ug | INTRAMUSCULAR | Status: DC | PRN
  Administered 2020-12-27 (×3): 50 ug via INTRAVENOUS

## 2020-12-27 MED ORDER — PHENYLEPHRINE 40 MCG/ML (10ML) SYRINGE FOR IV PUSH (FOR BLOOD PRESSURE SUPPORT)
PREFILLED_SYRINGE | INTRAVENOUS | Status: AC
  Filled 2020-12-27: qty 10

## 2020-12-27 SURGICAL SUPPLY — 45 items
BAG BANDED W/RUBBER/TAPE 36X54 (MISCELLANEOUS) ×2 IMPLANT
BAG COUNTER SPONGE SURGICOUNT (BAG) ×2 IMPLANT
BNDG ELASTIC 4X5.8 VLCR STR LF (GAUZE/BANDAGES/DRESSINGS) ×2 IMPLANT
BNDG ELASTIC 6X5.8 VLCR STR LF (GAUZE/BANDAGES/DRESSINGS) ×2 IMPLANT
BNDG GAUZE ELAST 4 BULKY (GAUZE/BANDAGES/DRESSINGS) ×2 IMPLANT
CANISTER SUCT 3000ML PPV (MISCELLANEOUS) IMPLANT
CHLORAPREP W/TINT 10.5 ML (MISCELLANEOUS) ×2 IMPLANT
CLIP LIGATING EXTRA MED SLVR (CLIP) ×2 IMPLANT
CLIP LIGATING EXTRA SM BLUE (MISCELLANEOUS) ×2 IMPLANT
COVER DOME SNAP 22 D (MISCELLANEOUS) ×2 IMPLANT
DERMABOND ADVANCED (GAUZE/BANDAGES/DRESSINGS) ×1
DERMABOND ADVANCED .7 DNX12 (GAUZE/BANDAGES/DRESSINGS) ×1 IMPLANT
DRAPE BRACHIAL (DRAPES) ×2 IMPLANT
DRSG MEPITEL 3X4 ME34 (GAUZE/BANDAGES/DRESSINGS) ×2 IMPLANT
DRSG TEGADERM 4X4.75 (GAUZE/BANDAGES/DRESSINGS) ×2 IMPLANT
ELECT REM PT RETURN 9FT ADLT (ELECTROSURGICAL)
ELECTRODE REM PT RTRN 9FT ADLT (ELECTROSURGICAL) IMPLANT
GAUZE SPONGE 4X4 12PLY STRL (GAUZE/BANDAGES/DRESSINGS) ×4 IMPLANT
GEL ULTRASOUND 8.5O AQUASONIC (MISCELLANEOUS) ×2 IMPLANT
GLOVE SURG ENC MOIS LTX SZ7.5 (GLOVE) ×2 IMPLANT
GOWN STRL REUS W/ TWL LRG LVL3 (GOWN DISPOSABLE) ×3 IMPLANT
GOWN STRL REUS W/ TWL XL LVL3 (GOWN DISPOSABLE) ×1 IMPLANT
GOWN STRL REUS W/TWL LRG LVL3 (GOWN DISPOSABLE) ×6
GOWN STRL REUS W/TWL XL LVL3 (GOWN DISPOSABLE) ×2
KIT BASIN OR (CUSTOM PROCEDURE TRAY) ×2 IMPLANT
KIT ENCORE 26 ADVANTAGE (KITS) IMPLANT
KIT TURNOVER KIT B (KITS) ×2 IMPLANT
NEEDLE PERC 18GX7CM (NEEDLE) IMPLANT
NS IRRIG 1000ML POUR BTL (IV SOLUTION) IMPLANT
PACK ENDO MINOR (CUSTOM PROCEDURE TRAY) IMPLANT
PAD ARMBOARD 7.5X6 YLW CONV (MISCELLANEOUS) ×4 IMPLANT
SET MICROPUNCTURE 5F STIFF (MISCELLANEOUS) ×2 IMPLANT
SPONGE T-LAP 18X18 ~~LOC~~+RFID (SPONGE) ×2 IMPLANT
STOPCOCK MORSE 400PSI 3WAY (MISCELLANEOUS) ×2 IMPLANT
SUT PROLENE 5 0 C 1 24 (SUTURE) ×10 IMPLANT
SUT VIC AB 3-0 SH 27 (SUTURE) ×2
SUT VIC AB 3-0 SH 27X BRD (SUTURE) ×1 IMPLANT
SYR 10ML LL (SYRINGE) ×6 IMPLANT
SYR 20ML LL LF (SYRINGE) ×4 IMPLANT
SYR CONTROL 10ML LL (SYRINGE) ×2 IMPLANT
TOWEL GREEN STERILE (TOWEL DISPOSABLE) ×2 IMPLANT
TUBING CIL FLEX 10 FLL-RA (TUBING) ×2 IMPLANT
UNDERPAD 30X36 HEAVY ABSORB (UNDERPADS AND DIAPERS) ×2 IMPLANT
WATER STERILE IRR 1000ML POUR (IV SOLUTION) IMPLANT
WIRE BENTSON .035X145CM (WIRE) IMPLANT

## 2020-12-27 NOTE — Anesthesia Procedure Notes (Signed)
Procedure Name: LMA Insertion Date/Time: 12/27/2020 2:02 PM Performed by: Terrence Dupont, CRNA Pre-anesthesia Checklist: Patient identified, Emergency Drugs available, Suction available and Patient being monitored Patient Re-evaluated:Patient Re-evaluated prior to induction Oxygen Delivery Method: Circle system utilized Preoxygenation: Pre-oxygenation with 100% oxygen Induction Type: IV induction Ventilation: Mask ventilation without difficulty LMA: LMA inserted LMA Size: 4.0 Tube type: Oral Number of attempts: 1 Airway Equipment and Method: Stylet and Oral airway Placement Confirmation: ETT inserted through vocal cords under direct vision, positive ETCO2 and breath sounds checked- equal and bilateral Tube secured with: Tape Dental Injury: Teeth and Oropharynx as per pre-operative assessment

## 2020-12-27 NOTE — Progress Notes (Addendum)
  Progress Note    12/27/2020 7:36 AM Day of Surgery  Subjective: She does not have complaints other than right arm pain  Vitals:   12/27/20 0606 12/27/20 0621  BP: (!) 106/59 121/66  Pulse: 64 72  Resp: 18 18  Temp: 99.1 F (37.3 C) 99.6 F (37.6 C)  SpO2: 94% 98%    Physical Exam: Awake and alert Right upper extremity with significant edema There is draining purulence near the antecubitum 3+ edema right upper extremity  CBC    Component Value Date/Time   WBC 5.9 12/27/2020 0156   RBC 2.09 (L) 12/27/2020 0156   HGB 6.6 (LL) 12/27/2020 0156   HCT 21.6 (L) 12/27/2020 0156   PLT 164 12/27/2020 0156   MCV 103.3 (H) 12/27/2020 0156   MCH 31.6 12/27/2020 0156   MCHC 30.6 12/27/2020 0156   RDW 16.6 (H) 12/27/2020 0156   LYMPHSABS 1.1 12/25/2020 0409   MONOABS 0.7 12/25/2020 0409   EOSABS 0.1 12/25/2020 0409   BASOSABS 0.0 12/25/2020 0409    BMET    Component Value Date/Time   NA 130 (L) 12/26/2020 0508   K 6.2 (H) 12/26/2020 0508   CL 93 (L) 12/26/2020 0508   CO2 20 (L) 12/26/2020 0508   GLUCOSE 72 12/26/2020 0508   BUN 103 (H) 12/26/2020 0508   CREATININE 13.14 (H) 12/26/2020 0508   CREATININE 5.66 (H) 12/22/2019 1117   CALCIUM 9.2 12/26/2020 0508   CALCIUM 9.0 03/12/2011 0527   GFRNONAA 3 (L) 12/26/2020 0508   GFRNONAA 5 (L) 02/12/2015 1000   GFRAA 9 (L) 10/22/2019 0105   GFRAA 5 (L) 02/12/2015 1000    INR    Component Value Date/Time   INR 1.14 12/28/2011 1403     Intake/Output Summary (Last 24 hours) at 12/27/2020 0736 Last data filed at 12/27/2020 0600 Gross per 24 hour  Intake 220.17 ml  Output 900 ml  Net -679.83 ml     Assessment/plan:  70 y.o. female is here with right upper extremity edema concern for innominate stent thrombosis.  Unfortunately she has eaten a cookie this morning which will delay her case.  She does have purulence draining from the arm and will need debrided today.  I think given her overall functional status it is  probably best at this point to ligate the fistula to prevent future hospitalizations and she can be catheter dependent in the interim.    Shauntea Lok C. Donzetta Matters, MD [facility] [place] Office: [phone] Pager: [phone]  12/27/2020 7:36 AM   Addendum:  I have re-evaluated her right arm has more purulence draining at this time.  She is now 7 hours from her cookie this morning.  We will plan to ligate the right upper extremity fistula given the multiple issues she has had with right upper extremity and drain the abscess cavity which will likely need to be packed open.  Suri Tafolla C. Donzetta Matters, MD

## 2020-12-27 NOTE — Evaluation (Signed)
Occupational Therapy Evaluation Patient Details Name: [name] MRN: [id number] DOB: 1951/03/01 Today's Date: 12/27/2020   History of Present Illness Pt is a 70 y.o. female who presented with end-stage renal disease 11/11/20 for right A/V fistulagram. 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 Pt with decline during admission, MRI negative for acute intracranial process, awaiting EEG results. PMH: anemia, anxiety, atrial flutter, C diff, depression, ESRD on HD, HIV, HTN, intracranial hemorrhage, pulmonary HTN, tachycardia, TIA, traumatic hematoma of R UE   Clinical Impression   Patient admitted for the diagnosis above, planned procedure this date.  PTA she lives at a local SNF, and is needing extensive assist for mobility and ADL care.  OT to follow in the acute setting to maximize self feeding and light UB ADL/grooming post procedure, but given level of care the patient needs, a return to her SNF is recommended.  R arm pain is a deficit.       Recommendations for follow up therapy are one component of a multi-disciplinary discharge planning process, led by the attending physician.  Recommendations may be updated based on patient status, additional functional criteria and insurance authorization.   Follow Up Recommendations  Long-term institutional care without follow-up therapy    Assistance Recommended at Discharge Frequent or constant Supervision/Assistance  Functional Status Assessment  Patient has had a recent decline in their functional status and/or demonstrates limited ability to make significant improvements in function in a reasonable and predictable amount of time  Equipment Recommendations  Other (comment)    Recommendations for Other Services       Precautions / Restrictions Precautions Precautions: Fall Precaution Comments: R arm swelling and ozzing purulent drainage at the elbow. Restrictions Weight Bearing Restrictions: No       Mobility Bed Mobility Overal bed mobility: Needs Assistance Bed Mobility: Supine to Sit;Sit to Supine     Supine to sit: Max assist;HOB elevated Sit to supine: Total assist        Transfers Overall transfer level:  (PT defers transfer attempts due to UE pain and drainage)                 General transfer comment: deferred      Balance Overall balance assessment: Needs assistance Sitting-balance support: Feet supported;Single extremity supported Sitting balance-Leahy Scale: Fair                                     ADL either performed or assessed with clinical judgement   ADL Overall ADL's : At baseline                                       General ADL Comments: Needing near total assist due to painful R arm.     Vision Patient Visual Report: No change from baseline       Perception Perception Perception: Not tested   Praxis Praxis Praxis: Not tested    Pertinent Vitals/Pain Pain Assessment: Faces Pain Score: 10-Worst pain ever Faces Pain Scale: Hurts whole lot Pain Location: R arm with movement, and touch Pain Descriptors / Indicators: Discomfort;Grimacing;Guarding;Crying;Tender Pain Intervention(s): Monitored during session     Hand Dominance Right   Extremity/Trunk Assessment Upper Extremity Assessment Upper Extremity Assessment: RUE deficits/detail RUE Deficits / Details: swollen and painful RUE: Unable to fully assess due to  pain RUE Sensation: decreased light touch RUE Coordination: decreased fine motor;decreased gross motor   Lower Extremity Assessment Lower Extremity Assessment: Defer to PT evaluation   Cervical / Trunk Assessment Cervical / Trunk Assessment: Kyphotic   Communication Communication Communication: No difficulties   Cognition Arousal/Alertness: Lethargic Behavior During Therapy: WFL for tasks assessed/performed Overall Cognitive Status: No family/caregiver present to determine  baseline cognitive functioning                                 General Comments: following commands and answering questions appropriately.     General Comments  VSS on RA    Exercises     Shoulder Instructions      Home Living Family/patient expects to be discharged to:: Skilled nursing facility                                 Additional Comments: Pt resides at [facility]      Prior Functioning/Environment Prior Level of Function : Needs assist       Physical Assist : Mobility (physical);ADLs (physical) Mobility (physical): Bed mobility;Transfers ADLs (physical): Feeding;Grooming;Bathing;Dressing;Toileting Mobility Comments: patient states she is assisted to a wheelchair. ADLs Comments: patient states staff baths and dresses her at bed level, and she is currenlty needing assist with eating use to R arm pain.        OT Problem List: Decreased activity tolerance;Decreased range of motion;Decreased strength;Impaired balance (sitting and/or standing);Impaired UE functional use;Decreased coordination;Pain      OT Treatment/Interventions: Self-care/ADL training;Therapeutic exercise;Therapeutic activities;Patient/family education;DME and/or AE instruction;Balance training    OT Goals(Current goals can be found in the care plan section) Acute Rehab OT Goals Patient Stated Goal: I want to get the surgery over OT Goal Formulation: With patient Time For Goal Achievement: 01/10/21 Potential to Achieve Goals: Good ADL Goals Pt Will Perform Eating: with set-up;sitting Pt Will Perform Grooming: sitting;with supervision Pt Will Perform Upper Body Bathing: with min assist;sitting Pt Will Perform Upper Body Dressing: with min assist;sitting Pt Will Transfer to Toilet: with mod assist;squat pivot transfer;bedside commode Pt Will Perform Toileting - Clothing Manipulation and hygiene: with mod assist;sitting/lateral leans  OT Frequency: Min 2X/week    Barriers to D/C:    none noted       Co-evaluation              AM-PAC OT "6 Clicks" Daily Activity     Outcome Measure Help from another person eating meals?: Total Help from another person taking care of personal grooming?: Total Help from another person toileting, which includes using toliet, bedpan, or urinal?: Total Help from another person bathing (including washing, rinsing, drying)?: Total Help from another person to put on and taking off regular upper body clothing?: Total Help from another person to put on and taking off regular lower body clothing?: Total 6 Click Score: 6   End of Session Nurse Communication: Other (comment) (MD in room, informed of R arm drainage, MD to swab for a culture.)  Activity Tolerance: Patient limited by pain Patient left: in bed;with call bell/phone within reach;with bed alarm set  OT Visit Diagnosis: Muscle weakness (generalized) (M62.81);Pain Pain - Right/Left: Right Pain - part of body: Arm                Time: 0867-6195 OT Time Calculation (min): 18 min Charges:  OT General Charges $  OT Visit: 1 Visit OT Evaluation $OT Eval Moderate Complexity: 1 Mod  12/27/2020  RP, OTR/L  Acute Rehabilitation Services  Office:  [place] 12/27/2020, 1:05 PM

## 2020-12-27 NOTE — Evaluation (Signed)
Physical Therapy Evaluation Patient Details Name: [name] MRN: [id number] DOB: 1951/01/26 Today's Date: 12/27/2020  History of Present Illness  Pt is a 70 y.o. female who presented with end-stage renal disease 11/11/20 for right A/V fistulagram. 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 Pt with decline during admission, MRI negative for acute intracranial process, awaiting EEG results. PMH: anemia, anxiety, atrial flutter, C diff, depression, ESRD on HD, HIV, HTN, intracranial hemorrhage, pulmonary HTN, tachycardia, TIA, traumatic hematoma of R UE  Clinical Impression  Pt presents to PT with deficits in strength, power, ROM, functional mobility, endurance, and with significant pain. Pt with significant RUE edema and PT notes copious drainage from RUE prior to mobility. With bed mobility RUE exudes further purulent and sanguineous drainage, with pt in significant discomfort and requiring much physical assistance to complete. PT defers further mobility at this time. Pt will benefit from follow-up after  debridement with vascular surgery. PT recommends return to SNF once medically ready.     Recommendations for follow up therapy are one component of a multi-disciplinary discharge planning process, led by the attending physician.  Recommendations may be updated based on patient status, additional functional criteria and insurance authorization.  Follow Up Recommendations Skilled nursing-short term rehab (<3 hours/day)    Assistance Recommended at Discharge Intermittent Supervision/Assistance  Functional Status Assessment Patient has had a recent decline in their functional status and demonstrates the ability to make significant improvements in function in a reasonable and predictable amount of time.  Equipment Recommendations   (defer to post-acute setting)    Recommendations for Other Services       Precautions / Restrictions Precautions Precautions:  Fall Precaution Comments: R arm swelling and ozzing purulent drainage at the elbow. Restrictions Weight Bearing Restrictions: No      Mobility  Bed Mobility Overal bed mobility: Needs Assistance Bed Mobility: Supine to Sit;Sit to Supine     Supine to sit: Max assist;HOB elevated Sit to supine: Total assist        Transfers Overall transfer level:  (PT defers transfer attempts due to UE pain and drainage)                      Ambulation/Gait                Stairs            Wheelchair Mobility    Modified Rankin (Stroke Patients Only)       Balance                                             Pertinent Vitals/Pain Pain Assessment: Faces Pain Score: 10-Worst pain ever Faces Pain Scale: Hurts whole lot Pain Location: R arm with movement, and touch Pain Descriptors / Indicators: Discomfort;Grimacing;Guarding;Crying;Tender Pain Intervention(s): Monitored during session    Home Living Family/patient expects to be discharged to:: Skilled nursing facility                   Additional Comments: Pt resides at [facility].    Prior Function Prior Level of Function : Needs assist       Physical Assist : Mobility (physical);ADLs (physical) Mobility (physical): Bed mobility;Transfers ADLs (physical): Feeding;Grooming;Bathing;Dressing;Toileting Mobility Comments: patient states she is assisted to a wheelchair. ADLs Comments: patient states staff baths and dresses her at bed  level, and she is currenlty needing assist with eating use to R arm pain.     Hand Dominance   Dominant Hand: Right    Extremity/Trunk Assessment   Upper Extremity Assessment Upper Extremity Assessment: RUE deficits/detail RUE Deficits / Details: swollen and painful RUE: Unable to fully assess due to pain RUE Sensation: decreased light touch RUE Coordination: decreased fine motor;decreased gross motor    Lower Extremity  Assessment Lower Extremity Assessment: Defer to PT evaluation    Cervical / Trunk Assessment Cervical / Trunk Assessment: Kyphotic  Communication   Communication: No difficulties  Cognition Arousal/Alertness: Lethargic Behavior During Therapy: WFL for tasks assessed/performed Overall Cognitive Status: No family/caregiver present to determine baseline cognitive functioning                                 General Comments: following commands and answering questions appropriately.        General Comments General comments (skin integrity, edema, etc.): VSS on RA    Exercises     Assessment/Plan    PT Assessment Patient needs continued PT services  PT Problem List Decreased strength;Decreased activity tolerance;Decreased balance;Decreased mobility;Decreased cognition;Pain       PT Treatment Interventions DME instruction;Gait training;Therapeutic activities;Functional mobility training;Therapeutic exercise;Balance training;Neuromuscular re-education;Cognitive remediation;Patient/family education;Wheelchair mobility training    PT Goals (Current goals can be found in the Care Plan section)  Acute Rehab PT Goals Patient Stated Goal: to reduce pain PT Goal Formulation: With patient Time For Goal Achievement: 01/10/21 Potential to Achieve Goals: Fair    Frequency Min 2X/week   Barriers to discharge        Co-evaluation               AM-PAC PT "6 Clicks" Mobility  Outcome Measure Help needed turning from your back to your side while in a flat bed without using bedrails?: A Lot Help needed moving from lying on your back to sitting on the side of a flat bed without using bedrails?: A Lot Help needed moving to and from a bed to a chair (including a wheelchair)?: Total Help needed standing up from a chair using your arms (e.g., wheelchair or bedside chair)?: Total Help needed to walk in hospital room?: Total Help needed climbing 3-5 steps with a railing? :  Total 6 Click Score: 8    End of Session   Activity Tolerance: Patient limited by pain;Treatment limited secondary to medical complications (Comment) (copious drainage from RUE) Patient left: in bed;with call bell/phone within reach;with bed alarm set Nurse Communication: Mobility status PT Visit Diagnosis: Other abnormalities of gait and mobility (R26.89);Muscle weakness (generalized) (M62.81);Pain Pain - Right/Left: Right Pain - part of body: Arm    Time: 1275-1700 PT Time Calculation (min) (ACUTE ONLY): 17 min   Charges:   PT Evaluation $PT Eval Moderate Complexity: 1 Mod          Zenaida Niece, PT, DPT Acute Rehabilitation Pager: [phone] Office [phone]   Zenaida Niece 12/27/2020, 1:02 PM

## 2020-12-27 NOTE — Anesthesia Preprocedure Evaluation (Signed)
Anesthesia Evaluation  Patient identified by MRN, date of birth, ID band Patient awake    Reviewed: Allergy & Precautions, H&P , NPO status , Patient's Chart, lab work & pertinent test results  Airway Mallampati: II   Neck ROM: full    Dental   Pulmonary former smoker,    breath sounds clear to auscultation       Cardiovascular hypertension, +CHF   Rhythm:regular Rate:Normal     Neuro/Psych  Headaches, PSYCHIATRIC DISORDERS Anxiety Depression TIA   GI/Hepatic PUD, GERD  ,  Endo/Other    Renal/GU ESRF and DialysisRenal disease     Musculoskeletal   Abdominal   Peds  Hematology  (+) Blood dyscrasia, anemia ,   Anesthesia Other Findings   Reproductive/Obstetrics                             Anesthesia Physical Anesthesia Plan  ASA: 3  Anesthesia Plan: General   Post-op Pain Management:    Induction: Intravenous  PONV Risk Score and Plan: 3 and Ondansetron, Dexamethasone and Treatment may vary due to age or medical condition  Airway Management Planned: LMA  Additional Equipment:   Intra-op Plan:   Post-operative Plan: Extubation in OR  Informed Consent: I have reviewed the patients History and Physical, chart, labs and discussed the procedure including the risks, benefits and alternatives for the proposed anesthesia with the patient or authorized representative who has indicated his/her understanding and acceptance.     Dental advisory given  Plan Discussed with: CRNA, Anesthesiologist and Surgeon  Anesthesia Plan Comments:         Anesthesia Quick Evaluation

## 2020-12-27 NOTE — Op Note (Signed)
    Patient name: CHANTRICE HAGG MRN: [id number] DOB: [birth year] Sex: female  12/27/2020 Pre-operative Diagnosis: Necrotic right arm AV fistula, end-stage renal disease, right innominate vein stent malfunction Post-operative diagnosis:  Same Surgeon:  Eda Paschal. Donzetta Matters, MD Assistant: Paulo Fruit, PA Procedure Performed: 1.  Ligation right arm AV fistula 2.  I&D right arm wound  Indications: 33 female with end-stage renal disease.  She has a previous right innominate vein stent which has been intervened upon recently.  We also recently ligated a right forearm fistula and converted this to an upper arm fistula which had been working prior to this admission.  She now has a fluctuant area which began draining purulence earlier in the day just below the antecubitum and severe edema of the right upper extremity.  She is now dialyzing via left IJ tunneled catheter.  She is indicated for I&D and I have discussed ligation of the right arm fistula.  Assistant was necessary to facilitate exposure and expedite the case.  Findings: The previous fistula that had been ligated in her forearm was necrotic.  There was a wound that extended down onto her forearm.  We opened a separate area and debrided the previous fistula.  There were multiple venous collaterals that essentially had pulsatile bleeding.  I did ligate the fistula after the anastomosis I was unable to expose back to the previous arterial venous anastomosis.  I was unable to get any tissue coverage over the ligated fistula.  We placed a wet-to-dry dressing and a pressure dressing of the right upper extremity.   Procedure:  The patient was identified in the holding area and taken to the operating where she is placed supine on upper table and LMA anesthesia induced.  She was sterilely prepped draped in the right upper extremity usual fashion, antibiotics were ministered a timeout was called.  The wound that was draining was opened up through the  previous incision.  The fistula was exposed.  There were multiple collaterals that were bleeding these were ligated with clips and with suture ligation with 3-0 Vicryl suture.  I did have to open a separate incision with elliptical type incision where the old fistula was necrotic.  I removed all the necrotic tissue thoroughly irrigated the wound.  I was then able to get around the fistula proximally I cannot get back to the arterial valve's anastomosis safely given the significant edema.  I clamped the fistula proximally and distally and transected it.  I oversewed both ends with 5-0 Prolene suture in a running mattress fashion.  I thoroughly irrigated the wound.  I attempted to obtain hemostasis as good as possible.  I then packed it tightly with wet-to-dry dressing.  A sterile dressing was placed with compression dressing with Ace wraps of the upper extremity.  She did tolerate procedure well without immediate complication.  All counts were correct to completion.   EBL: 500cc  Marjean Imperato C. Donzetta Matters, MD [facility] Office: [phone] Pager: [phone]

## 2020-12-27 NOTE — Progress Notes (Signed)
Patient ID: [name], female   DOB: 25-Apr-1950, 70 y.o.   MRN: [id number] S: tolerated HD yesterday PM UF 0.9L.  Had a cookie this AM so OR delayed - plan now for AVF ligation and debridement.  SHe has no c/o this AM.    O:BP 120/66 (BP Location: Left Arm)   Pulse 72   Temp 99 F (37.2 C) (Oral)   Resp 17   Ht 5' 2.01" (1.575 m)   Wt 46.1 kg   LMP  (LMP Unknown)   SpO2 96%   BMI 18.58 kg/m   Intake/Output Summary (Last 24 hours) at 12/27/2020 1252 Last data filed at 12/27/2020 0600 Gross per 24 hour  Intake 220.17 ml  Output 900 ml  Net -679.83 ml    Intake/Output: I/O last 3 completed shifts: In: 320.2 [P.O.:120; I.V.:0.2; IV Piggyback:200] Out: 900 [Other:900]  Intake/Output this shift:  No intake/output data recorded. Weight change: 1.8 kg Gen:NAD-  new left temp cath CVS: RRR Resp:CTA Abd: +BS,s oft, NT/ND Ext: RUE AVF +T/B, right arm more edematous and tender with fluctuant area in antecubital region.  Recent Labs  Lab 12/24/20 1518 12/25/20 0409 12/26/20 0508 12/27/20 0958  NA 132* 131* 130* 137  K 5.3* 4.9 6.2* 4.0  CL 92* 91* 93* 98  CO2 23 21* 20* 27  GLUCOSE 68* 61* 72 89  BUN 87* 93* 103* 30*  CREATININE 10.98* 11.47* 13.14* 6.26*  ALBUMIN 2.9* 2.6*  --   --   CALCIUM 8.9 9.0 9.2 9.4  AST 12* 10*  --   --   ALT 9 7  --   --     Liver Function Tests: Recent Labs  Lab 12/24/20 1518 12/25/20 0409  AST 12* 10*  ALT 9 7  ALKPHOS 88 87  BILITOT 0.6 0.9  PROT 7.8 7.1  ALBUMIN 2.9* 2.6*    No results for input(s): LIPASE, AMYLASE in the last 168 hours. No results for input(s): AMMONIA in the last 168 hours. CBC: Recent Labs  Lab 12/24/20 1518 12/25/20 0409 12/26/20 0508 12/27/20 0156 12/27/20 0958  WBC 9.2 8.1 8.8 5.9  --   NEUTROABS 7.6 6.1  --   --   --   HGB 7.8* 7.3* 7.4* 6.6* 8.2*  HCT 25.7* 23.9* 24.2* 21.6* 26.7*  MCV 109.8* 107.7* 103.9* 103.3*  --   PLT 205 194 174 164  --     Cardiac Enzymes: No results for  input(s): CKTOTAL, CKMB, CKMBINDEX, TROPONINI in the last 168 hours. CBG: Recent Labs  Lab 12/26/20 1921 12/26/20 2202 12/26/20 2349 12/27/20 0520 12/27/20 1135  GLUCAP 59* 113* 98 87 78     Iron Studies: No results for input(s): IRON, TIBC, TRANSFERRIN, FERRITIN in the last 72 hours. Studies/Results: IR Fluoro Guide CV Line Left  Result Date: 12/26/2020 INDICATION: 70 year old female with history of end-stage renal disease in malfunctioning right upper extremity arteriovenous fistula in the setting of right innominate vein stenosis/stent occlusion. Alternate hemodialysis access is required pending central venous intervention. EXAM: NON-TUNNELED CENTRAL VENOUS HEMODIALYSIS CATHETER PLACEMENT WITH ULTRASOUND AND FLUOROSCOPIC GUIDANCE COMPARISON:  None. MEDICATIONS: None FLUOROSCOPY TIME:  0 minutes, 18 seconds (2 mGy) COMPLICATIONS: None immediate. PROCEDURE: Informed written consent was obtained from the patient after a discussion of the risks, benefits, and alternatives to treatment. Questions regarding the procedure were encouraged and answered. The left neck and chest were prepped with chlorhexidine in a sterile fashion, and a sterile drape was applied covering the operative field. Maximum barrier  sterile technique with sterile gowns and gloves were used for the procedure. A timeout was performed prior to the initiation of the procedure. After the overlying soft tissues were anesthetized, a small venotomy incision was created and a micropuncture kit was utilized to access the internal jugular vein. Real-time ultrasound guidance was utilized for vascular access including the acquisition of a permanent ultrasound image documenting patency of the accessed vessel. The microwire was utilized to measure appropriate catheter length. A Rosen wire was advanced to the level of the IVC. Under fluoroscopic guidance, the venotomy was serially dilated, ultimately allowing placement of a 20 cm temporary  Trialysis catheter with tip ultimately terminating within the superior aspect of the right atrium. Final catheter positioning was confirmed and documented with a spot radiographic image. The catheter aspirates and flushes normally. The catheter was flushed with appropriate volume heparin dwells. The catheter exit site was secured with a 0-Silk retention suture. A dressing was placed. The patient tolerated the procedure well without immediate post procedural complication. IMPRESSION: Successful placement of a left internal jugular approach 20 cm temporary dialysis catheter with tip terminating with in the superior aspect of the right atrium. The catheter is ready for immediate use. PLAN: This catheter may be converted to a tunneled dialysis catheter at a later date as indicated. Ruthann Cancer, MD [facility] Radiology Electronically Signed   By: Ruthann Cancer M.D.   On: 12/26/2020 12:45   IR US Guide Vasc Access Left  Result Date: 12/26/2020 INDICATION: 70 year old female with history of end-stage renal disease in malfunctioning right upper extremity arteriovenous fistula in the setting of right innominate vein stenosis/stent occlusion. Alternate hemodialysis access is required pending central venous intervention. EXAM: NON-TUNNELED CENTRAL VENOUS HEMODIALYSIS CATHETER PLACEMENT WITH ULTRASOUND AND FLUOROSCOPIC GUIDANCE COMPARISON:  None. MEDICATIONS: None FLUOROSCOPY TIME:  0 minutes, 18 seconds (2 mGy) COMPLICATIONS: None immediate. PROCEDURE: Informed written consent was obtained from the patient after a discussion of the risks, benefits, and alternatives to treatment. Questions regarding the procedure were encouraged and answered. The left neck and chest were prepped with chlorhexidine in a sterile fashion, and a sterile drape was applied covering the operative field. Maximum barrier sterile technique with sterile gowns and gloves were used for the procedure. A  timeout was performed prior to the initiation of the procedure. After the overlying soft tissues were anesthetized, a small venotomy incision was created and a micropuncture kit was utilized to access the internal jugular vein. Real-time ultrasound guidance was utilized for vascular access including the acquisition of a permanent ultrasound image documenting patency of the accessed vessel. The microwire was utilized to measure appropriate catheter length. A Rosen wire was advanced to the level of the IVC. Under fluoroscopic guidance, the venotomy was serially dilated, ultimately allowing placement of a 20 cm temporary Trialysis catheter with tip ultimately terminating within the superior aspect of the right atrium. Final catheter positioning was confirmed and documented with a spot radiographic image. The catheter aspirates and flushes normally. The catheter was flushed with appropriate volume heparin dwells. The catheter exit site was secured with a 0-Silk retention suture. A dressing was placed. The patient tolerated the procedure well without immediate post procedural complication. IMPRESSION: Successful placement of a left internal jugular approach 20 cm temporary dialysis catheter with tip terminating with in the superior aspect of the right atrium. The catheter is ready for immediate use. PLAN: This catheter may be converted to a tunneled dialysis catheter at a later date as indicated.  Ruthann Cancer, MD [facility] Radiology Electronically Signed   By: Ruthann Cancer M.D.   On: 12/26/2020 12:45    sodium chloride   Intravenous Once   [START ON 12/28/2020] calcitRIOL  1.25 mcg Oral Q T,Th,Sa-HD   Chlorhexidine Gluconate Cloth  6 each Topical Q0600   [START ON 12/28/2020] cinacalcet  60 mg Oral Q T,Th,Sa-HD   darbepoetin (ARANESP) injection - DIALYSIS  200 mcg Intravenous Q Thu-HD   dolutegravir  50 mg Oral q1600   fentaNYL  1 patch Transdermal Q72H    lamiVUDine  50 mg Oral Daily   methadone  2.5 mg Oral Q2000   metoprolol tartrate  25 mg Oral BID   mirtazapine  15 mg Oral QHS   multivitamin  1 tablet Oral QHS   pantoprazole  40 mg Oral QAC breakfast   vancomycin variable dose per unstable renal function (pharmacist dosing)   Does not apply See admin instructions   venlafaxine XR  150 mg Oral Q breakfast   zidovudine  300 mg Oral Daily    BMET    Component Value Date/Time   NA 137 12/27/2020 0958   K 4.0 12/27/2020 0958   CL 98 12/27/2020 0958   CO2 27 12/27/2020 0958   GLUCOSE 89 12/27/2020 0958   BUN 30 (H) 12/27/2020 0958   CREATININE 6.26 (H) 12/27/2020 0958   CREATININE 5.66 (H) 12/22/2019 1117   CALCIUM 9.4 12/27/2020 0958   CALCIUM 9.0 03/12/2011 0527   GFRNONAA 7 (L) 12/27/2020 0958   GFRNONAA 5 (L) 02/12/2015 1000   GFRAA 9 (L) 10/22/2019 0105   GFRAA 5 (L) 02/12/2015 1000   CBC    Component Value Date/Time   WBC 5.9 12/27/2020 0156   RBC 2.09 (L) 12/27/2020 0156   HGB 8.2 (L) 12/27/2020 0958   HCT 26.7 (L) 12/27/2020 0958   PLT 164 12/27/2020 0156   MCV 103.3 (H) 12/27/2020 0156   MCH 31.6 12/27/2020 0156   MCHC 30.6 12/27/2020 0156   RDW 16.6 (H) 12/27/2020 0156   LYMPHSABS 1.1 12/25/2020 0409   MONOABS 0.7 12/25/2020 0409   EOSABS 0.1 12/25/2020 0409   BASOSABS 0.0 12/25/2020 0409    Dialysis Orders: Center: [facility]  on TTS . EDW 41.5kg HD Bath 2K/2.5Ca  Time 3 hours Heparin 1000 units bolus then 500 units/hr. 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  appreciate vascular surgery evaluation-  plan is ligation and debridement today.  On abx.   ESRD -  Last HD was 10/18 then 10/27 in hospital.  Plan for HD tomorrow due to OR later today; on TTS schedule outpt.  WIll need temp HD catheter changed prior to d/c.    Hypertension/volume  - stable- no BP meds-  Uf as able-   max of 3.5 liters  Anemia  - continue with ESA.    Metabolic bone disease -  continue with calcitriol and sensipar-  I dont see a binder on her med list  Nutrition - renal diet. Hypoglycemia - PRN D50  Hyperkalemia - now resolved after HD and lokelma.   [name]  [name] [phone]

## 2020-12-27 NOTE — Consult Note (Addendum)
[facility] for Infectious Disease  Total days of antibiotics 4        Day 4 vanco/cefepime               Reason for Consult: infected right AVF    Referring Physician: amin  Active Problems:   Human immunodeficiency virus (HIV) disease ([place])   Essential hypertension   ESRD on dialysis ([place])   Atrial flutter ([place])   Anemia of chronic disease   Infected fluid collection with fistula   Hypoglycemia    HPI: [name] is a 69 y.o. female with well controlled HIV disease, CD 4 count of 252/VL undetectable,tivicay/zidovudine/epivir dosed for her HD days. Followed by Dr [facility]e Sim[facility] at [facility].She was admitted for painful right arm, warmth and swelling to fistula, extended swelling to forearm and right hand. Symptoms presented for the past 7 days. She has not been able to tolerate 2 of HD sessions due to worsening symptoms. She was admitted on 10/25 with fever and leukocytosis. She was empirically started on vancomycin and cefepime. On evening of 10/27, the fluctuance/abscess by right arm AVF spontaneously drained purulent bloody fluid. She still has ongoing fever, going to OR for I x D.  Past Medical History:  Diagnosis Date   Anemia    Anxiety    Atrial flutter ([place]) 02/22/2015   C. difficile colitis 10/10/11   Chronic diarrhea    Chronic pain    Depression    ESRD on hemodialysis [facility])    Dialysis [name]   GERD (gastroesophageal reflux disease)    HIV (human immunodeficiency virus infection) ([place])    Hypertension    Insomnia    Intracranial hemorrhage (HCC)    Muscle weakness (generalized)    Pulmonary HTN (HCC)    Tachycardia    TIA (transient ischemic attack)    [facility] do not have this dx   Traumatic hematoma of right upper arm 02/22/2015    Allergies:  Allergies  Allergen Reactions   Lactose Intolerance (Gi) Other (See Comments)    On MAR   Latex Rash and Itching   Penicillins Rash and Swelling    Has patient had a PCN reaction  causing immediate rash, facial/tongue/throat swelling, SOB or lightheadedness with hypotension: No Has patient had a PCN reaction causing severe rash involving mucus membranes or skin necrosis: No Has patient had a PCN reaction that required hospitalization No Has patient had a PCN reaction occurring within the last 10 years: No If all of the above answers are "NO", then may proceed with Cephalosporin use.      Current antibiotics:   MEDICATIONS:  sodium chloride   Intravenous Once   [START ON 12/28/2020] calcitRIOL  1.25 mcg Oral Q T,Th,Sa-HD   Chlorhexidine Gluconate Cloth  6 each Topical Q0600   [START ON 12/28/2020] cinacalcet  60 mg Oral Q T,Th,Sa-HD   darbepoetin (ARANESP) injection - DIALYSIS  200 mcg Intravenous Q Thu-HD   dolutegravir  50 mg Oral q1600   fentaNYL  1 patch Transdermal Q72H   lamiVUDine  50 mg Oral Daily   methadone  2.5 mg Oral Q2000   metoprolol tartrate  25 mg Oral BID   mirtazapine  15 mg Oral QHS   multivitamin  1 tablet Oral QHS   pantoprazole  40 mg Oral QAC breakfast   vancomycin variable dose per unstable renal function (pharmacist dosing)   Does not apply See admin instructions   venlafaxine XR  150 mg  Oral Q breakfast   zidovudine  300 mg Oral Daily    Social History   Tobacco Use   Smoking status: Former    Types: Cigarettes    Quit date: 03/12/1983    Years since quitting: 37.8   Smokeless tobacco: Never  Vaping Use   Vaping Use: Never used  Substance Use Topics   Alcohol use: No    Alcohol/week: 0.0 standard drinks   Drug use: No    Family History  Problem Relation Age of Onset   Anesthesia problems Neg Hx    Hypotension Neg Hx    Malignant hyperthermia Neg Hx    Pseudochol deficiency Neg Hx    Colon cancer Neg Hx        Unsure of parents who both died when she was a baby     Review of Systems  Constitutional: positive for fever, chills, diaphoresis, activity change, appetite change, fatigue and unexpected weight change.   HENT: Negative for congestion, sore throat, rhinorrhea, sneezing, trouble swallowing and sinus pressure.  Eyes: Negative for photophobia and visual disturbance.  Respiratory: Negative for cough, chest tightness, shortness of breath, wheezing and stridor.  Cardiovascular: Negative for chest pain, palpitations and leg swelling.  Gastrointestinal: Negative for nausea, vomiting, abdominal pain, diarrhea, constipation, blood in stool, abdominal distention and anal bleeding.  Genitourinary: Negative for dysuria, hematuria, flank pain and difficulty urinating.  Musculoskeletal: Negative for myalgias, back pain, joint swelling, arthralgias and gait problem.  Skin: positive for right arm swelling/abscess.Negative for color change, pallor, rash and wound.  Neurological: Negative for dizziness, tremors, weakness and light-headedness.  Hematological: Negative for adenopathy. Does not bruise/bleed easily.  Psychiatric/Behavioral: Negative for behavioral problems, confusion, sleep disturbance, dysphoric mood, decreased concentration and agitation.    OBJECTIVE: Temp:  [98 F (36.7 C)-100.5 F (38.1 C)] 99 F (37.2 C) (10/28 0900) Pulse Rate:  [58-118] 72 (10/28 0900) Resp:  [9-20] 17 (10/28 0900) BP: (92-145)/(44-69) 120/66 (10/28 0900) SpO2:  [91 %-98 %] 96 % (10/28 0900) Weight:  [46.1 kg-47.9 kg] 46.1 kg (10/28 0432) Physical Exam  Constitutional:  oriented to person, place, and time. appears well-developed and well-nourished. No distress.  HENT: [place]/AT, PERRLA, no scleral icterus Mouth/Throat: Oropharynx is clear and moist. No oropharyngeal exudate.  Cardiovascular: Normal rate, regular rhythm and normal heart sounds. Exam reveals no gallop and no friction rub.  No murmur heard.  Pulmonary/Chest: Effort normal and breath sounds normal. No respiratory distress.  has no wheezes.  Neck = supple, no nuchal rigidity Chest wall = left HD catheter placed Abdominal: Soft. Bowel sounds are normal.   exhibits no distension. There is no tenderness.  Lymphadenopathy: no cervical adenopathy. No axillary adenopathy Ext: right AVF- thrill and draining fluctuant region by AVF Neurological: alert and oriented to person, place, and time.  Skin: Skin is warm and dry. No rash noted. No erythema.  Psychiatric: a normal mood and affect.  behavior is normal.    LABS: Results for orders placed or performed during the hospital encounter of 12/24/20 (from the past 48 hour(s))  CBG monitoring, ED     Status: Abnormal   Collection Time: 12/25/20  2:02 PM  Result Value Ref Range   Glucose-Capillary 116 (H) 70 - 99 mg/dL    Comment: Glucose reference range applies only to samples taken after fasting for at least 8 hours.  Glucose, capillary     Status: Abnormal   Collection Time: 12/25/20  4:28 PM  Result Value Ref Range   Glucose-Capillary 64 (  L) 70 - 99 mg/dL    Comment: Glucose reference range applies only to samples taken after fasting for at least 8 hours.   Comment 1 Notify RN    Comment 2 Document in Chart   Glucose, capillary     Status: Abnormal   Collection Time: 12/25/20  5:15 PM  Result Value Ref Range   Glucose-Capillary 62 (L) 70 - 99 mg/dL    Comment: Glucose reference range applies only to samples taken after fasting for at least 8 hours.  Glucose, capillary     Status: Abnormal   Collection Time: 12/25/20  6:03 PM  Result Value Ref Range   Glucose-Capillary 159 (H) 70 - 99 mg/dL    Comment: Glucose reference range applies only to samples taken after fasting for at least 8 hours.   Comment 1 Notify RN    Comment 2 Document in Chart   Glucose, capillary     Status: None   Collection Time: 12/25/20  8:30 PM  Result Value Ref Range   Glucose-Capillary 91 70 - 99 mg/dL    Comment: Glucose reference range applies only to samples taken after fasting for at least 8 hours.  Glucose, capillary     Status: None   Collection Time: 12/26/20 12:04 AM  Result Value Ref Range    Glucose-Capillary 95 70 - 99 mg/dL    Comment: Glucose reference range applies only to samples taken after fasting for at least 8 hours.  Glucose, capillary     Status: None   Collection Time: 12/26/20  4:38 AM  Result Value Ref Range   Glucose-Capillary 73 70 - 99 mg/dL    Comment: Glucose reference range applies only to samples taken after fasting for at least 8 hours.  CBC     Status: Abnormal   Collection Time: 12/26/20  5:08 AM  Result Value Ref Range   WBC 8.8 4.0 - 10.5 K/uL   RBC 2.33 (L) 3.87 - 5.11 MIL/uL   Hemoglobin 7.4 (L) 12.0 - 15.0 g/dL   HCT 24.2 (L) 36.0 - 46.0 %   MCV 103.9 (H) 80.0 - 100.0 fL   MCH 31.8 26.0 - 34.0 pg   MCHC 30.6 30.0 - 36.0 g/dL   RDW 17.2 (H) 11.5 - 15.5 %   Platelets 174 150 - 400 K/uL   nRBC 0.0 0.0 - 0.2 %    Comment: Performed at [facility] [address], [place], [place] [address]  Basic metabolic panel     Status: Abnormal   Collection Time: 12/26/20  5:08 AM  Result Value Ref Range   Sodium 130 (L) 135 - 145 mmol/L   Potassium 6.2 (H) 3.5 - 5.1 mmol/L   Chloride 93 (L) 98 - 111 mmol/L   CO2 20 (L) 22 - 32 mmol/L   Glucose, Bld 72 70 - 99 mg/dL    Comment: Glucose reference range applies only to samples taken after fasting for at least 8 hours.   BUN 103 (H) 8 - 23 mg/dL   Creatinine, Ser 13.14 (H) 0.44 - 1.00 mg/dL   Calcium 9.2 8.9 - 10.3 mg/dL   GFR, Estimated 3 (L) >60 mL/min    Comment: (NOTE) Calculated using the CKD-EPI Creatinine Equation (2021)    Anion gap 17 (H) 5 - 15    Comment: Performed at [facility] [address], [place], [place] [address]  Glucose, capillary     Status: None   Collection Time: 12/26/20  7:47 AM  Result Value Ref Range   Glucose-Capillary 92 70 - 99 mg/dL    Comment: Glucose reference range applies only to samples taken after fasting for at least 8 hours.  Surgical pcr screen     Status: None   Collection Time: 12/26/20 12:42 PM   Specimen: Nasal Mucosa; Nasal Swab  Result  Value Ref Range   MRSA, PCR NEGATIVE NEGATIVE   Staphylococcus aureus NEGATIVE NEGATIVE    Comment: (NOTE) The Xpert SA Assay (FDA approved for NASAL specimens in patients 77 years of age and older), is one component of a comprehensive surveillance program. It is not intended to diagnose infection nor to guide or monitor treatment. Performed at [facility], [place] [address], [place], [place] [address]   Glucose, capillary     Status: None   Collection Time: 12/26/20 12:46 PM  Result Value Ref Range   Glucose-Capillary 75 70 - 99 mg/dL    Comment: Glucose reference range applies only to samples taken after fasting for at least 8 hours.  Glucose, capillary     Status: None   Collection Time: 12/26/20  2:34 PM  Result Value Ref Range   Glucose-Capillary 72 70 - 99 mg/dL    Comment: Glucose reference range applies only to samples taken after fasting for at least 8 hours.   Comment 1 Notify RN    Comment 2 Document in Chart   Glucose, capillary     Status: Abnormal   Collection Time: 12/26/20  7:21 PM  Result Value Ref Range   Glucose-Capillary 59 (L) 70 - 99 mg/dL    Comment: Glucose reference range applies only to samples taken after fasting for at least 8 hours.  Glucose, capillary     Status: Abnormal   Collection Time: 12/26/20 10:02 PM  Result Value Ref Range   Glucose-Capillary 113 (H) 70 - 99 mg/dL    Comment: Glucose reference range applies only to samples taken after fasting for at least 8 hours.  Glucose, capillary     Status: None   Collection Time: 12/26/20 11:49 PM  Result Value Ref Range   Glucose-Capillary 98 70 - 99 mg/dL    Comment: Glucose reference range applies only to samples taken after fasting for at least 8 hours.  CBC     Status: Abnormal   Collection Time: 12/27/20  1:56 AM  Result Value Ref Range   WBC 5.9 4.0 - 10.5 K/uL   RBC 2.09 (L) 3.87 - 5.11 MIL/uL   Hemoglobin 6.6 (LL) 12.0 - 15.0 g/dL    Comment: REPEATED TO VERIFY THIS CRITICAL RESULT  HAS VERIFIED AND BEEN CALLED TO N MURPHY RN BY CANDACE HAYES ON 10 28 2022 AT 0222, AND HAS BEEN READ BACK.     HCT 21.6 (L) 36.0 - 46.0 %   MCV 103.3 (H) 80.0 - 100.0 fL   MCH 31.6 26.0 - 34.0 pg   MCHC 30.6 30.0 - 36.0 g/dL   RDW 16.6 (H) 11.5 - 15.5 %   Platelets 164 150 - 400 K/uL   nRBC 0.0 0.0 - 0.2 %    Comment: Performed at [facility] [address], [place], [place] [address]  Magnesium     Status: None   Collection Time: 12/27/20  1:56 AM  Result Value Ref Range   Magnesium 2.0 1.7 - 2.4 mg/dL    Comment: Performed at [facility] [address], [place], [place] [address]  Prepare RBC (  crossmatch)     Status: None   Collection Time: 12/27/20  3:32 AM  Result Value Ref Range   Order Confirmation      ORDER PROCESSED BY BLOOD BANK Performed at [facility], [place] [address], [place], [place] [address]   Type and screen [place]     Status: None (Preliminary result)   Collection Time: 12/27/20  4:13 AM  Result Value Ref Range   ABO/RH(D) O POS    Antibody Screen NEG    Sample Expiration 12/30/2020,2359    Unit Number [id number]    Blood Component Type RED CELLS,LR    Unit division 00    Status of Unit ISSUED    Transfusion Status OK TO TRANSFUSE    Crossmatch Result      Compatible Performed at [facility], [place] [address] [place], [place] [address]    Unit Number [id number]    Blood Component Type RED CELLS,LR    Unit division 00    Status of Unit ALLOCATED    Transfusion Status OK TO TRANSFUSE    Crossmatch Result Compatible    Unit Number [id number]    Blood Component Type RED CELLS,LR    Unit division 00    Status of Unit ALLOCATED    Transfusion Status OK TO TRANSFUSE    Crossmatch Result Compatible   Prepare RBC (crossmatch)     Status: None   Collection Time: 12/27/20  4:51 AM  Result Value Ref Range   Order Confirmation      ORDER PROCESSED BY BLOOD BANK Performed at [facility],  [address] [place], [place] [address]   Glucose, capillary     Status: None   Collection Time: 12/27/20  5:20 AM  Result Value Ref Range   Glucose-Capillary 87 70 - 99 mg/dL    Comment: Glucose reference range applies only to samples taken after fasting for at least 8 hours.  Basic metabolic panel     Status: Abnormal   Collection Time: 12/27/20  9:58 AM  Result Value Ref Range   Sodium 137 135 - 145 mmol/L   Potassium 4.0 3.5 - 5.1 mmol/L   Chloride 98 98 - 111 mmol/L   CO2 27 22 - 32 mmol/L   Glucose, Bld 89 70 - 99 mg/dL    Comment: Glucose reference range applies only to samples taken after fasting for at least 8 hours.   BUN 30 (H) 8 - 23 mg/dL   Creatinine, Ser 6.26 (H) 0.44 - 1.00 mg/dL   Calcium 9.4 8.9 - 10.3 mg/dL   GFR, Estimated 7 (L) >60 mL/min    Comment: (NOTE) Calculated using the CKD-EPI Creatinine Equation (2021)    Anion gap 12 5 - 15    Comment: Performed at [facility] [address] [place], [place] [address]  Hemoglobin and hematocrit, blood     Status: Abnormal   Collection Time: 12/27/20  9:58 AM  Result Value Ref Range   Hemoglobin 8.2 (L) 12.0 - 15.0 g/dL   HCT 26.7 (L) 36.0 - 46.0 %    Comment: Performed at [facility], [place] [address] [place], [place] [address]  Glucose, capillary     Status: None   Collection Time: 12/27/20 11:35 AM  Result Value Ref Range   Glucose-Capillary 78 70 - 99 mg/dL    Comment: Glucose reference range applies only to samples taken after fasting for at least 8 hours.  MICRO:  IMAGING: IR Fluoro Guide CV Line Left  Result Date: 12/26/2020 INDICATION: 70 year old female with history of end-stage renal disease in malfunctioning right upper extremity arteriovenous fistula in the setting of right innominate vein stenosis/stent occlusion. Alternate hemodialysis access is required pending central venous intervention. EXAM: NON-TUNNELED CENTRAL VENOUS HEMODIALYSIS CATHETER PLACEMENT WITH ULTRASOUND AND  FLUOROSCOPIC GUIDANCE COMPARISON:  None. MEDICATIONS: None FLUOROSCOPY TIME:  0 minutes, 18 seconds (2 mGy) COMPLICATIONS: None immediate. PROCEDURE: Informed written consent was obtained from the patient after a discussion of the risks, benefits, and alternatives to treatment. Questions regarding the procedure were encouraged and answered. The left neck and chest were prepped with chlorhexidine in a sterile fashion, and a sterile drape was applied covering the operative field. Maximum barrier sterile technique with sterile gowns and gloves were used for the procedure. A timeout was performed prior to the initiation of the procedure. After the overlying soft tissues were anesthetized, a small venotomy incision was created and a micropuncture kit was utilized to access the internal jugular vein. Real-time ultrasound guidance was utilized for vascular access including the acquisition of a permanent ultrasound image documenting patency of the accessed vessel. The microwire was utilized to measure appropriate catheter length. A Rosen wire was advanced to the level of the IVC. Under fluoroscopic guidance, the venotomy was serially dilated, ultimately allowing placement of a 20 cm temporary Trialysis catheter with tip ultimately terminating within the superior aspect of the right atrium. Final catheter positioning was confirmed and documented with a spot radiographic image. The catheter aspirates and flushes normally. The catheter was flushed with appropriate volume heparin dwells. The catheter exit site was secured with a 0-Silk retention suture. A dressing was placed. The patient tolerated the procedure well without immediate post procedural complication. IMPRESSION: Successful placement of a left internal jugular approach 20 cm temporary dialysis catheter with tip terminating with in the superior aspect of the right atrium. The catheter is ready for immediate use. PLAN: This catheter may be converted to a tunneled  dialysis catheter at a later date as indicated. Ruthann Cancer, MD [facility] Radiology Electronically Signed   By: Ruthann Cancer M.D.   On: 12/26/2020 12:45   IR US Guide Vasc Access Left  Result Date: 12/26/2020 INDICATION: 70 year old female with history of end-stage renal disease in malfunctioning right upper extremity arteriovenous fistula in the setting of right innominate vein stenosis/stent occlusion. Alternate hemodialysis access is required pending central venous intervention. EXAM: NON-TUNNELED CENTRAL VENOUS HEMODIALYSIS CATHETER PLACEMENT WITH ULTRASOUND AND FLUOROSCOPIC GUIDANCE COMPARISON:  None. MEDICATIONS: None FLUOROSCOPY TIME:  0 minutes, 18 seconds (2 mGy) COMPLICATIONS: None immediate. PROCEDURE: Informed written consent was obtained from the patient after a discussion of the risks, benefits, and alternatives to treatment. Questions regarding the procedure were encouraged and answered. The left neck and chest were prepped with chlorhexidine in a sterile fashion, and a sterile drape was applied covering the operative field. Maximum barrier sterile technique with sterile gowns and gloves were used for the procedure. A timeout was performed prior to the initiation of the procedure. After the overlying soft tissues were anesthetized, a small venotomy incision was created and a micropuncture kit was utilized to access the internal jugular vein. Real-time ultrasound guidance was utilized for vascular access including the acquisition of a permanent ultrasound image documenting patency of the accessed vessel. The microwire was utilized to measure appropriate catheter length. A Rosen wire was advanced to the level of the IVC. Under fluoroscopic guidance, the  venotomy was serially dilated, ultimately allowing placement of a 20 cm temporary Trialysis catheter with tip ultimately terminating within the superior aspect of the right atrium. Final catheter  positioning was confirmed and documented with a spot radiographic image. The catheter aspirates and flushes normally. The catheter was flushed with appropriate volume heparin dwells. The catheter exit site was secured with a 0-Silk retention suture. A dressing was placed. The patient tolerated the procedure well without immediate post procedural complication. IMPRESSION: Successful placement of a left internal jugular approach 20 cm temporary dialysis catheter with tip terminating with in the superior aspect of the right atrium. The catheter is ready for immediate use. PLAN: This catheter may be converted to a tunneled dialysis catheter at a later date as indicated. Ruthann Cancer, MD [facility] Radiology Electronically Signed   By: Ruthann Cancer M.D.   On: 12/26/2020 12:45    HISTORICAL MICRO/IMAGING  Assessment/Plan:  70yo F with HIV disease well controlled, ESRD via AVF. Presents with infected right AVF fluctuant mass. She is MRSA colonized. Blood cx NGTD at 48hrs. She was started on vanco and cefepime  - will do culture of purulent fluid from avg  - please send OR specimen from IX D to help with abtx choice - continue to monitor blood cx - continue on vancomycin and cefepime for the time being  Hiv disease= long standing. Well controlled. Continue on home regimen.  Fevers = secondary to infected AVF and not having source control yet. Anticipate to improve after I x D today

## 2020-12-27 NOTE — Transfer of Care (Signed)
Immediate Anesthesia Transfer of Care Note  Patient: [name]  Procedure(s) Performed: INCISION AND DRAINAGE OF RIGHT FOREARM (Right) LIGATION OF ARTERIOVENOUS  FISTULA (Right)  Patient Location: PACU  Anesthesia Type:General  Level of Consciousness: awake and alert   Airway & Oxygen Therapy: Patient Spontanous Breathing and Patient connected to nasal cannula oxygen  Post-op Assessment: Report given to RN and Post -op Vital signs reviewed and stable  Post vital signs: Reviewed and stable  Last Vitals:  Vitals Value Taken Time  BP 167/96 12/27/20 1522  Temp    Pulse 67 12/27/20 1524  Resp 23 12/27/20 1524  SpO2 96 % 12/27/20 1524  Vitals shown include unvalidated device data.  Last Pain:  Vitals:   12/27/20 1329  TempSrc: Oral  PainSc:       Patients Stated Pain Goal: 0 (81/15/72 6203)  Complications: No notable events documented.

## 2020-12-27 NOTE — Care Management Important Message (Signed)
Important Message  Patient Details  Name: [name] MRN: [id number] Date of Birth: 21-Mar-1950   Medicare Important Message Given:        Orbie Pyo 12/27/2020, 3:58 PM

## 2020-12-27 NOTE — Progress Notes (Signed)
Pt reported to have had a cookie this AM prior to surgery. Pt to be delayed 6 hours. Pt returned to 5M15. Report called to RN that pt was returning d/t not being NPO.

## 2020-12-27 NOTE — Progress Notes (Addendum)
HGB 6.6 this AM.  Will type and cross, prep 2u, transfuse 1u.  H/H post transfusion.  Defer need for 2nd unit or not to nephrology who looks like were already considering another dialysis session today.  No BM for 2 days.  Wondering if blood loss is around the old fistula in arm.  Addendum:  Pt seen and evaluated at bedside  S: pt states arm feels about the same as yesterday, no different or worse this AM.  O: pt still has sensation and motor function of fingers in hand.  Good cap refill, cant really get a radial pulse due to the edema.  Has some purulent drainage from fistula infection site which is somewhat red, but no rapid bloody drainage / arterial spray.  Looks like shes already scheduled to go to OR with Dr. Donzetta Matters at [facility] this AM (2.5h from now).  Will increase prep order to 3u, just-in-case.

## 2020-12-27 NOTE — Progress Notes (Signed)
PROGRESS NOTE    [name]  IRJ:[id number] DOB: [birth year] DOA: 12/24/2020 PCP: Hilbert Corrigan, MD   Brief Narrative:  70 year old with history of ESRD on HD Tuesday Thursday Saturday, HIV, a flutter on Eliquis comes to the hospital with complaints of right upper extremity AV fistula infection.  She had an AV fistula occlusion about 4 weeks ago and underwent balloon angioplasty of the right subclavian and right innominate vein with conversion to right forearm fistula to right upper extremity fistula.  She initially went to [facility] where she was diagnosed with AV fistula infection and transferred to [facility].  Vascular, nephro, IR and infectious disease are consulted.   Assessment & Plan:   Active Problems:   Human immunodeficiency virus (HIV) disease ([place])   Essential hypertension   ESRD on dialysis ([place])   Atrial flutter (HCC)   Anemia of chronic disease   Infected fluid collection with fistula   Hypoglycemia   Right AV fistula infection now with purulent discharge - Currently on broad-spectrum antibiotics IV vancomycin and cefepime - Vascular plans on fistulogram with possible I&D today, there was a delay as patient had eaten a cookie this morning. -Infectious disease following, follow-up culture data. - Pain control, dry dressing.  ESRD on hemodialysis TTS -Nephrology team following, temporary catheter in for dialysis.  Hyperkalemia -Resolved.  Potassium 4.0 this morning  Atrial flutter - Heart rate is well controlled.  Chronically on Eliquis, on hold  History of HIV - CD4 count 252, continue antiretrovirals  Anemia of chronic disease - Baseline hemoglobin 8.0.  Continue to monitor  History of chronic pain syndrome - On fentanyl patch and methadone    DVT prophylaxis: SCDs Start: 12/24/20 1728 Code Status: Full Family Communication:  None  Status is: Inpatient  Remains inpatient appropriate because: Ongoing treatment  for AV fistula infection.  Plans for OR today.   Nutritional status  Nutrition Problem: Severe Malnutrition Etiology: chronic illness (ESRD on HD)  Signs/Symptoms: severe fat depletion, severe muscle depletion  Interventions: Snacks, MVI, Other (Comment) (double protein)  Body mass index is 18.58 kg/m.  Pressure Injury 10/21/19 Sacrum (Active)  10/21/19 1807  Location: Sacrum  Location Orientation:   Staging:   Wound Description (Comments):   Present on Admission:           Subjective: Patient ate a cookie this morning despite of being n.p.o. therefore her case is a delayed.  When seen and examined at bedside she is having some drainage in her right upper extremity.  No other complaints  Review of Systems Otherwise negative except as per HPI, including: General: Denies fever, chills, night sweats or unintended weight loss. Resp: Denies cough, wheezing, shortness of breath. Cardiac: Denies chest pain, palpitations, orthopnea, paroxysmal nocturnal dyspnea. GI: Denies abdominal pain, nausea, vomiting, diarrhea or constipation GU: Denies dysuria, frequency, hesitancy or incontinence MS: Denies muscle aches, joint pain or swelling Neuro: Denies headache, neurologic deficits (focal weakness, numbness, tingling), abnormal gait Psych: Denies anxiety, depression, SI/HI/AVH Skin: Denies new rashes or lesions ID: Denies sick contacts, exotic exposures, travel Examination:  Constitutional: Not in acute distress Respiratory: Clear to auscultation bilaterally Cardiovascular: Normal sinus rhythm, no rubs Abdomen: Nontender nondistended good bowel sounds Musculoskeletal: No edema noted Skin: Right upper extremity drainage and swelling noted Neurologic: CN 2-12 grossly intact.  And nonfocal Psychiatric: Normal judgment and insight. Alert and oriented x 3. Normal mood.  Objective: Vitals:   12/27/20 0606 12/27/20 0621 12/27/20 0827 12/27/20 0900  BP: Marland Kitchen)  106/59 121/66 125/64  120/66  Pulse: 64 72 75 72  Resp: _0 Temp: 99.1 F (37.3 C) 99.6 F (37.6 C) 98.5 F (36.9 C) 99 F (37.2 C)  TempSrc: Oral Oral Oral Oral  SpO2: 94% 98% 96% 96%  Weight:      Height:        Intake/Output Summary (Last 24 hours) at 12/27/2020 1234 Last data filed at 12/27/2020 0600 Gross per 24 hour  Intake 220.17 ml  Output 900 ml  Net -679.83 ml   Filed Weights   12/26/20 0428 12/26/20 1508 12/27/20 0432  Weight: 46.1 kg 47.9 kg 46.1 kg     Data Reviewed:   CBC: Recent Labs  Lab 12/24/20 1518 12/25/20 0409 12/26/20 0508 12/27/20 0156 12/27/20 0958  WBC 9.2 8.1 8.8 5.9  --   NEUTROABS 7.6 6.1  --   --   --   HGB 7.8* 7.3* 7.4* 6.6* 8.2*  HCT 25.7* 23.9* 24.2* 21.6* 26.7*  MCV 109.8* 107.7* 103.9* 103.3*  --   PLT 205 194 174 164  --    Basic Metabolic Panel: Recent Labs  Lab 12/24/20 1518 12/25/20 0409 12/26/20 0508 12/27/20 0156 12/27/20 0958  NA 132* 131* 130*  --  137  K 5.3* 4.9 6.2*  --  4.0  CL 92* 91* 93*  --  98  CO2 23 21* 20*  --  27  GLUCOSE 68* 61* 72  --  89  BUN 87* 93* 103*  --  30*  CREATININE 10.98* 11.47* 13.14*  --  6.26*  CALCIUM 8.9 9.0 9.2  --  9.4  MG 2.2  --   --  2.0  --    GFR: Estimated Creatinine Clearance: 6.3 mL/min (A) (by C-G formula based on SCr of 6.26 mg/dL (H)). Liver Function Tests: Recent Labs  Lab 12/24/20 1518 12/25/20 0409  AST 12* 10*  ALT 9 7  ALKPHOS 88 87  BILITOT 0.6 0.9  PROT 7.8 7.1  ALBUMIN 2.9* 2.6*   No results for input(s): LIPASE, AMYLASE in the last 168 hours. No results for input(s): AMMONIA in the last 168 hours. Coagulation Profile: No results for input(s): INR, PROTIME in the last 168 hours. Cardiac Enzymes: No results for input(s): CKTOTAL, CKMB, CKMBINDEX, TROPONINI in the last 168 hours. BNP (last 3 results) No results for input(s): PROBNP in the last 8760 hours. HbA1C: No results for input(s): HGBA1C in the last 72 hours. CBG: Recent Labs  Lab 12/26/20 1921  12/26/20 2202 12/26/20 2349 12/27/20 0520 12/27/20 1135  GLUCAP 59* 113* 98 87 78   Lipid Profile: No results for input(s): CHOL, HDL, LDLCALC, TRIG, CHOLHDL, LDLDIRECT in the last 72 hours. Thyroid Function Tests: No results for input(s): TSH, T4TOTAL, FREET4, T3FREE, THYROIDAB in the last 72 hours. Anemia Panel: No results for input(s): VITAMINB12, FOLATE, FERRITIN, TIBC, IRON, RETICCTPCT in the last 72 hours. Sepsis Labs: Recent Labs  Lab 12/24/20 1518 12/24/20 1658  LATICACIDVEN 1.6 1.0    Recent Results (from the past 240 hour(s))  Resp Panel by RT-PCR (Flu A&B, Covid) Nasopharyngeal Swab     Status: None   Collection Time: 12/24/20  2:53 PM   Specimen: Nasopharyngeal Swab; Nasopharyngeal(NP) swabs in vial transport medium  Result Value Ref Range Status   SARS Coronavirus 2 by RT PCR NEGATIVE NEGATIVE Final    Comment: (NOTE) SARS-CoV-2 target nucleic acids are NOT DETECTED.  The SARS-CoV-2 RNA is generally detectable in upper respiratory specimens during the  acute phase of infection. The lowest concentration of SARS-CoV-2 viral copies this assay can detect is 138 copies/mL. A negative result does not preclude SARS-Cov-2 infection and should not be used as the sole basis for treatment or other patient management decisions. A negative result may occur with  improper specimen collection/handling, submission of specimen other than nasopharyngeal swab, presence of viral mutation(s) within the areas targeted by this assay, and inadequate number of viral copies(<138 copies/mL). A negative result must be combined with clinical observations, patient history, and epidemiological information. The expected result is Negative.  Fact Sheet for Patients:  EntrepreneurPulse.com.au  Fact Sheet for Healthcare Providers:  IncredibleEmployment.be  This test is no t yet approved or cleared by the [place] FDA and  has been authorized for  detection and/or diagnosis of SARS-CoV-2 by FDA under an Emergency Use Authorization (EUA). This EUA will remain  in effect (meaning this test can be used) for the duration of the COVID-19 declaration under Section 564(b)(1) of the Act, 21 U.S.C.section 360bbb-3(b)(1), unless the authorization is terminated  or revoked sooner.       Influenza A by PCR NEGATIVE NEGATIVE Final   Influenza B by PCR NEGATIVE NEGATIVE Final    Comment: (NOTE) The Xpert Xpress SARS-CoV-2/FLU/RSV plus assay is intended as an aid in the diagnosis of influenza from Nasopharyngeal swab specimens and should not be used as a sole basis for treatment. Nasal washings and aspirates are unacceptable for Xpert Xpress SARS-CoV-2/FLU/RSV testing.  Fact Sheet for Patients: EntrepreneurPulse.com.au  Fact Sheet for Healthcare Providers: IncredibleEmployment.be  This test is not yet approved or cleared by the [place] FDA and has been authorized for detection and/or diagnosis of SARS-CoV-2 by FDA under an Emergency Use Authorization (EUA). This EUA will remain in effect (meaning this test can be used) for the duration of the COVID-19 declaration under Section 564(b)(1) of the Act, 21 U.S.C. section 360bbb-3(b)(1), unless the authorization is terminated or revoked.  Performed at [facility], [address] [place], [place] [address]   Blood culture (routine x 2)     Status: None (Preliminary result)   Collection Time: 12/24/20  3:19 PM   Specimen: BLOOD LEFT HAND  Result Value Ref Range Status   Specimen Description BLOOD LEFT HAND  Final   Special Requests   Final    BOTTLES DRAWN AEROBIC AND ANAEROBIC Blood Culture adequate volume   Culture   Final    NO GROWTH 3 DAYS Performed at [facility], [address] [place], [place] [address]    Report Status PENDING  Incomplete  Blood culture (routine x 2)     Status: None (Preliminary result)   Collection Time: 12/24/20   3:19 PM   Specimen: BLOOD LEFT ARM  Result Value Ref Range Status   Specimen Description BLOOD LEFT ARM  Final   Special Requests   Final    BOTTLES DRAWN AEROBIC AND ANAEROBIC Blood Culture adequate volume   Culture   Final    NO GROWTH 3 DAYS Performed at [facility], [address], [place], [place] [address]    Report Status PENDING  Incomplete  Surgical pcr screen     Status: None   Collection Time: 12/26/20 12:42 PM   Specimen: Nasal Mucosa; Nasal Swab  Result Value Ref Range Status   MRSA, PCR NEGATIVE NEGATIVE Final   Staphylococcus aureus NEGATIVE NEGATIVE Final    Comment: (NOTE) The Xpert SA Assay (FDA approved for NASAL specimens in patients 16 years of age  and older), is one component of a comprehensive surveillance program. It is not intended to diagnose infection nor to guide or monitor treatment. Performed at [facility], [place] [address], [place], [place] [address]          Radiology Studies: IR Fluoro Guide CV Line Left  Result Date: 12/26/2020 INDICATION: 70 year old female with history of end-stage renal disease in malfunctioning right upper extremity arteriovenous fistula in the setting of right innominate vein stenosis/stent occlusion. Alternate hemodialysis access is required pending central venous intervention. EXAM: NON-TUNNELED CENTRAL VENOUS HEMODIALYSIS CATHETER PLACEMENT WITH ULTRASOUND AND FLUOROSCOPIC GUIDANCE COMPARISON:  None. MEDICATIONS: None FLUOROSCOPY TIME:  0 minutes, 18 seconds (2 mGy) COMPLICATIONS: None immediate. PROCEDURE: Informed written consent was obtained from the patient after a discussion of the risks, benefits, and alternatives to treatment. Questions regarding the procedure were encouraged and answered. The left neck and chest were prepped with chlorhexidine in a sterile fashion, and a sterile drape was applied covering the operative field. Maximum barrier sterile technique with sterile gowns and gloves were used for the  procedure. A timeout was performed prior to the initiation of the procedure. After the overlying soft tissues were anesthetized, a small venotomy incision was created and a micropuncture kit was utilized to access the internal jugular vein. Real-time ultrasound guidance was utilized for vascular access including the acquisition of a permanent ultrasound image documenting patency of the accessed vessel. The microwire was utilized to measure appropriate catheter length. A Rosen wire was advanced to the level of the IVC. Under fluoroscopic guidance, the venotomy was serially dilated, ultimately allowing placement of a 20 cm temporary Trialysis catheter with tip ultimately terminating within the superior aspect of the right atrium. Final catheter positioning was confirmed and documented with a spot radiographic image. The catheter aspirates and flushes normally. The catheter was flushed with appropriate volume heparin dwells. The catheter exit site was secured with a 0-Silk retention suture. A dressing was placed. The patient tolerated the procedure well without immediate post procedural complication. IMPRESSION: Successful placement of a left internal jugular approach 20 cm temporary dialysis catheter with tip terminating with in the superior aspect of the right atrium. The catheter is ready for immediate use. PLAN: This catheter may be converted to a tunneled dialysis catheter at a later date as indicated. Ruthann Cancer, MD [facility] Electronically Signed   By: Ruthann Cancer M.D.   On: 12/26/2020 12:45   IR US Guide Vasc Access Left  Result Date: 12/26/2020 INDICATION: 70 year old female with history of end-stage renal disease in malfunctioning right upper extremity arteriovenous fistula in the setting of right innominate vein stenosis/stent occlusion. Alternate hemodialysis access is required pending central venous intervention. EXAM: NON-TUNNELED  CENTRAL VENOUS HEMODIALYSIS CATHETER PLACEMENT WITH ULTRASOUND AND FLUOROSCOPIC GUIDANCE COMPARISON:  None. MEDICATIONS: None FLUOROSCOPY TIME:  0 minutes, 18 seconds (2 mGy) COMPLICATIONS: None immediate. PROCEDURE: Informed written consent was obtained from the patient after a discussion of the risks, benefits, and alternatives to treatment. Questions regarding the procedure were encouraged and answered. The left neck and chest were prepped with chlorhexidine in a sterile fashion, and a sterile drape was applied covering the operative field. Maximum barrier sterile technique with sterile gowns and gloves were used for the procedure. A timeout was performed prior to the initiation of the procedure. After the overlying soft tissues were anesthetized, a small venotomy incision was created and a micropuncture kit was utilized to access the internal jugular vein. Real-time ultrasound  guidance was utilized for vascular access including the acquisition of a permanent ultrasound image documenting patency of the accessed vessel. The microwire was utilized to measure appropriate catheter length. A Rosen wire was advanced to the level of the IVC. Under fluoroscopic guidance, the venotomy was serially dilated, ultimately allowing placement of a 20 cm temporary Trialysis catheter with tip ultimately terminating within the superior aspect of the right atrium. Final catheter positioning was confirmed and documented with a spot radiographic image. The catheter aspirates and flushes normally. The catheter was flushed with appropriate volume heparin dwells. The catheter exit site was secured with a 0-Silk retention suture. A dressing was placed. The patient tolerated the procedure well without immediate post procedural complication. IMPRESSION: Successful placement of a left internal jugular approach 20 cm temporary dialysis catheter with tip terminating with in the superior aspect of the right atrium. The catheter is ready for  immediate use. PLAN: This catheter may be converted to a tunneled dialysis catheter at a later date as indicated. Ruthann Cancer, MD [facility] Interventional Radiology Specialists [facility] Electronically Signed   By: Ruthann Cancer M.D.   On: 12/26/2020 12:45        Scheduled Meds:  sodium chloride   Intravenous Once   [START ON 12/28/2020] calcitRIOL  1.25 mcg Oral Q T,Th,Sa-HD   Chlorhexidine Gluconate Cloth  6 each Topical Q0600   [START ON 12/28/2020] cinacalcet  60 mg Oral Q T,Th,Sa-HD   darbepoetin (ARANESP) injection - DIALYSIS  200 mcg Intravenous Q Thu-HD   dolutegravir  50 mg Oral q1600   fentaNYL  1 patch Transdermal Q72H   lamiVUDine  50 mg Oral Daily   methadone  2.5 mg Oral Q2000   metoprolol tartrate  25 mg Oral BID   mirtazapine  15 mg Oral QHS   multivitamin  1 tablet Oral QHS   pantoprazole  40 mg Oral QAC breakfast   vancomycin variable dose per unstable renal function (pharmacist dosing)   Does not apply See admin instructions   venlafaxine XR  150 mg Oral Q breakfast   zidovudine  300 mg Oral Daily   Continuous Infusions:  ceFEPime (MAXIPIME) IV 1 g (12/26/20 2223)     LOS: 3 days   Time spent= 35 mins    Markale Birdsell Arsenio Loader, MD [facility]  If 7PM-7AM, please contact night-coverage  12/27/2020, 12:34 PM

## 2020-12-28 DIAGNOSIS — B20 Human immunodeficiency virus [HIV] disease: Secondary | ICD-10-CM | POA: Diagnosis not present

## 2020-12-28 DIAGNOSIS — I10 Essential (primary) hypertension: Secondary | ICD-10-CM | POA: Diagnosis not present

## 2020-12-28 DIAGNOSIS — L089 Local infection of the skin and subcutaneous tissue, unspecified: Secondary | ICD-10-CM | POA: Diagnosis not present

## 2020-12-28 DIAGNOSIS — N186 End stage renal disease: Secondary | ICD-10-CM | POA: Diagnosis not present

## 2020-12-28 LAB — CBC
HCT: 33 % — ABNORMAL LOW (ref 36.0–46.0)
Hemoglobin: 10.9 g/dL — ABNORMAL LOW (ref 12.0–15.0)
MCH: 32 pg (ref 26.0–34.0)
MCHC: 33 g/dL (ref 30.0–36.0)
MCV: 96.8 fL (ref 80.0–100.0)
Platelets: 161 10*3/uL (ref 150–400)
RBC: 3.41 MIL/uL — ABNORMAL LOW (ref 3.87–5.11)
RDW: 20.5 % — ABNORMAL HIGH (ref 11.5–15.5)
WBC: 9.8 10*3/uL (ref 4.0–10.5)
nRBC: 0 % (ref 0.0–0.2)

## 2020-12-28 LAB — GLUCOSE, CAPILLARY
Glucose-Capillary: 86 mg/dL (ref 70–99)
Glucose-Capillary: 101 mg/dL — ABNORMAL HIGH (ref 70–99)
Glucose-Capillary: 84 mg/dL (ref 70–99)

## 2020-12-28 LAB — RENAL FUNCTION PANEL
Albumin: 2.3 g/dL — ABNORMAL LOW (ref 3.5–5.0)
Anion gap: 12 (ref 5–15)
BUN: 40 mg/dL — ABNORMAL HIGH (ref 8–23)
CO2: 25 mmol/L (ref 22–32)
Calcium: 9.3 mg/dL (ref 8.9–10.3)
Chloride: 100 mmol/L (ref 98–111)
Creatinine, Ser: 7.35 mg/dL — ABNORMAL HIGH (ref 0.44–1.00)
GFR, Estimated: 6 mL/min — ABNORMAL LOW (ref >60)
Glucose, Bld: 77 mg/dL (ref 70–99)
Phosphorus: 7.6 mg/dL — ABNORMAL HIGH (ref 2.5–4.6)
Potassium: 4 mmol/L (ref 3.5–5.1)
Sodium: 137 mmol/L (ref 135–145)

## 2020-12-28 LAB — MAGNESIUM: Magnesium: 2.2 mg/dL (ref 1.7–2.4)

## 2020-12-28 MED ORDER — METOPROLOL TARTRATE 5 MG/5ML IV SOLN
5.0000 mg | INTRAVENOUS | Status: DC | PRN
  Administered 2020-12-28 – 2020-12-29 (×3): 5 mg via INTRAVENOUS
  Filled 2020-12-28 (×3): qty 5

## 2020-12-28 MED ORDER — VANCOMYCIN HCL IN DEXTROSE 500-5 MG/100ML-% IV SOLN: 500.0000 mg | INTRAVENOUS | Status: DC

## 2020-12-28 MED ORDER — HEPARIN SODIUM (PORCINE) 1000 UNIT/ML IJ SOLN
2800.0000 [IU] | Freq: Once | INTRAMUSCULAR | Status: AC
  Administered 2020-12-28: 2800 [IU] via INTRAVENOUS

## 2020-12-28 MED ORDER — OXYCODONE HCL 5 MG PO TABS
5.0000 mg | ORAL_TABLET | ORAL | Status: DC | PRN
  Administered 2020-12-28 – 2021-01-09 (×20): 5 mg via ORAL
  Filled 2020-12-28 (×20): qty 1

## 2020-12-28 MED ORDER — METOPROLOL TARTRATE 5 MG/5ML IV SOLN
INTRAVENOUS | Status: AC
  Filled 2020-12-28: qty 5

## 2020-12-28 MED ORDER — HEPARIN SODIUM (PORCINE) 1000 UNIT/ML IJ SOLN
INTRAMUSCULAR | Status: AC
  Filled 2020-12-28: qty 3

## 2020-12-28 NOTE — Progress Notes (Signed)
Heart rate and order changes discussed with primary nurse.

## 2020-12-28 NOTE — Progress Notes (Signed)
Treatment terminated per telephone order from Dr. Johnney Ou .

## 2020-12-28 NOTE — Progress Notes (Addendum)
Patient with tachycardia and hypotension during treatment. UF was turned off. Metoprolol was given for heart rate. Heart rate not improved. Dr. Johnney Ou notified of heart rate. Treatment terminated early due to heart rate per telephone order from Dr. Johnney Ou. Hospitalist service called twice with no answer. Secure message sent to Dr. [name] concerning heart rate.

## 2020-12-28 NOTE — Progress Notes (Signed)
Patient with tachycardia  greater than 120's. Tachycardia treated with prn metoprolol. Dr. Johnney Ou notified.

## 2020-12-28 NOTE — Procedures (Signed)
I was present at this dialysis session, have reviewed the session itself and made  appropriate changes  3.5hr tx time, has 1:25 remaining.   Having Afib/fl with RVR into 130s.  Rec'd IV metoprolol and BP has declined into 80/40s.   She appears diaphoretic but is awake and alert with O2 sat 98% Will check BG.   If low BP persists will d/c treatment early and notify primary.   Jannifer Hick MD [facility] pager [phone]   12/28/2020, 10:53 AM

## 2020-12-28 NOTE — Progress Notes (Signed)
VASCULAR AND VEIN SPECIALISTS OF [place] PROGRESS NOTE  ASSESSMENT / PLAN: [name] is a 69 y.o. female status post ligation of right arm AV fistula and incision and drainage of right arm.   PRN pain control Diet as tolerated Keep dressing in place until tomorrow to assist hemostasis. Reinforce dressing as needed over the next 24h. Vascular team will take dressing down tomorrow.   SUBJECTIVE: Right arm painful. No other complaints.  OBJECTIVE: BP (!) 158/91   Pulse (!) 113   Temp 97.8 F (36.6 C) (Oral)   Resp 17   Ht 5' 2.5" (1.588 m)   Wt 47 kg   LMP  (LMP Unknown)   SpO2 100%   BMI 18.65 kg/m   Intake/Output Summary (Last 24 hours) at 12/28/2020 1517 Last data filed at 12/27/2020 2205 Gross per 24 hour  Intake 2370.67 ml  Output 350 ml  Net 2020.67 ml    Constitutional: chronically ill appearing. no acute distress. Cardiac: RRR. Pulmonary: unlabored breathing Abdomen: not distended Vascular: RUE dressing clean. Some mild serous strikethrough on the pillow. LIJ dialysis catheter in place.  CBC Latest Ref Rng & Units 12/28/2020 12/27/2020 12/27/2020  WBC 4.0 - 10.5 K/uL 9.8 - 5.9  Hemoglobin 12.0 - 15.0 g/dL 10.9(L) 8.2(L) 6.6(LL)  Hematocrit 36.0 - 46.0 % 33.0(L) 26.7(L) 21.6(L)  Platelets 150 - 400 K/uL 161 - 164     CMP Latest Ref Rng & Units 12/27/2020 12/26/2020 12/25/2020  Glucose 70 - 99 mg/dL 89 72 61(L)  BUN 8 - 23 mg/dL 30(H) 103(H) 93(H)  Creatinine 0.44 - 1.00 mg/dL 6.26(H) 13.14(H) 11.47(H)  Sodium 135 - 145 mmol/L 137 130(L) 131(L)  Potassium 3.5 - 5.1 mmol/L 4.0 6.2(H) 4.9  Chloride 98 - 111 mmol/L 98 93(L) 91(L)  CO2 22 - 32 mmol/L 27 20(L) 21(L)  Calcium 8.9 - 10.3 mg/dL 9.4 9.2 9.0  Total Protein 6.5 - 8.1 g/dL - - 7.1  Total Bilirubin 0.3 - 1.2 mg/dL - - 0.9  Alkaline Phos 38 - 126 U/L - - 87  AST 15 - 41 U/L - - 10(L)  ALT 0 - 44 U/L - - 7    Estimated Creatinine Clearance: 6.5 mL/min (A) (by C-G formula based on SCr of  6.26 mg/dL (H)).  [name]. Stanford Breed, MD [facility] Phone Number: [phone] 12/28/2020 9:22 AM

## 2020-12-28 NOTE — Progress Notes (Signed)
BFR at 300 due to high arterial pressures alarms.

## 2020-12-28 NOTE — Progress Notes (Signed)
Pharmacy Antibiotic Note  [name] is a 70 y.o. female admitted on 12/24/2020 with AV fistula infection.  Pharmacy consulted for vancomycin and cefepime dosing. She has ESRD and is on HD TTS. She is s/p ligation right arm AV fistula, I&D right arm wound for necrotic R AV fistula on 10/28. Last HD was 10/27 and is due for session today. She is afebrile, WBC are normal, and cultures are in process.  Vancomycin 1000 mg IV load given 10/25, 500 mg given after HD on 10/27, and 1000 mg given pre-op on 10/28.  Plan: No vancomycin after HD session today since she received 1000 mg yesterday Vancomycin 500 mg IV after HD on TTS Consider pre-HD level with next HD session if vancomycin continued Continue cefepime 1 g IV q24h Monitor clinical progress, cultures/sensitivities F/U LOT and de-escalate as able   Height: 5' 2.5" (158.8 cm) Weight: 48.5 kg (106 lb 14.8 oz) IBW/kg (Calculated) : 51.25  Temp (24hrs), Avg:98.4 F (36.9 C), Min:97.7 F (36.5 C), Max:99 F (37.2 C)  Recent Labs  Lab 12/24/20 1518 12/24/20 1658 12/25/20 0409 12/26/20 0508 12/27/20 0156 12/27/20 0958 12/28/20 0247  WBC 9.2  --  8.1 8.8 5.9  --  9.8  CREATININE 10.98*  --  11.47* 13.14*  --  6.26*  --   LATICACIDVEN 1.6 1.0  --   --   --   --   --      Estimated Creatinine Clearance: 6.7 mL/min (A) (by C-G formula based on SCr of 6.26 mg/dL (H)).    Allergies  Allergen Reactions   Lactose Intolerance (Gi) Other (See Comments)    On MAR   Latex Rash and Itching   Penicillins Rash and Swelling    Has patient had a PCN reaction causing immediate rash, facial/tongue/throat swelling, SOB or lightheadedness with hypotension: No Has patient had a PCN reaction causing severe rash involving mucus membranes or skin necrosis: No Has patient had a PCN reaction that required hospitalization No Has patient had a PCN reaction occurring within the last 10 years: No If all of the above answers are "NO", then may  proceed with Cephalosporin use.      Antimicrobials this admission: Vanco 10/25 >>  Cefepime 10/25 >>   Dose adjustments this admission:   Microbiology results: 10/25 blood>> ngtd 10/27 Surgical MRSA PCR : neg;  SA: neg 10/25 COVID-19 PCR: neg; flu: neg 10/28 R arm tissue: rare GPC pairs  Thank you for involving pharmacy in this patient's care.  Renold Genta, PharmD, BCPS Clinical Pharmacist Clinical phone for 12/28/2020 until 3p is [phone] 12/28/2020 8:19 AM  **Pharmacist phone directory can be found on [email or web address] listed under [place]**

## 2020-12-28 NOTE — Anesthesia Postprocedure Evaluation (Signed)
Anesthesia Post Note  Patient: [name]  Procedure(s) Performed: INCISION AND DRAINAGE OF RIGHT FOREARM (Right) LIGATION OF ARTERIOVENOUS  FISTULA (Right)     Patient location during evaluation: PACU Anesthesia Type: General Level of consciousness: awake and alert Pain management: pain level controlled Vital Signs Assessment: post-procedure vital signs reviewed and stable Respiratory status: spontaneous breathing, nonlabored ventilation, respiratory function stable and patient connected to nasal cannula oxygen Cardiovascular status: blood pressure returned to baseline and stable Postop Assessment: no apparent nausea or vomiting Anesthetic complications: no   No notable events documented.  Last Vitals:  Vitals:   12/27/20 2020 12/28/20 0510  BP: (!) 150/95 (!) 151/96  Pulse: 69 (!) 108  Resp: 18 18  Temp: 36.8 C 36.7 C  SpO2: 100% 92%    Last Pain:  Vitals:   12/28/20 0510  TempSrc: Oral  PainSc:                  Brethren S

## 2020-12-28 NOTE — Progress Notes (Signed)
PROGRESS NOTE    [name]  MWN:[id number] DOB: [birth year] DOA: 12/24/2020 PCP: Hilbert Corrigan, MD   Brief Narrative:  70 year old with history of ESRD on HD Tuesday Thursday Saturday, HIV, a flutter on Eliquis comes to the hospital with complaints of right upper extremity AV fistula infection.  She had an AV fistula occlusion about 4 weeks ago and underwent balloon angioplasty of the right subclavian and right innominate vein with conversion to right forearm fistula to right upper extremity fistula.  She initially went to [facility] where she was diagnosed with AV fistula infection and transferred to [facility].  Vascular, nephro, IR and infectious disease are consulted.  Patient underwent ligation of AV fistula and I&D on 12/27/2020.   Assessment & Plan:   Active Problems:   Human immunodeficiency virus (HIV) disease ([place])   Essential hypertension   ESRD on dialysis [facility])   Atrial flutter ([place])   Anemia of chronic disease   Infected fluid collection with fistula   Hypoglycemia   Right AV fistula infection now with purulent discharge - Currently on broad-spectrum antibiotics IV vancomycin and cefepime - Vascular performed I&D/ligation of AV fistula on 12/19/2020.  Dressing management per their service -Infectious disease following, continue to follow culture data. - Pain control, dry dressing.  ESRD on hemodialysis TTS - Temporary HD cath in place, continue dialysis per nephrology  Hyperkalemia -Resolved.  Potassium 4.0 this morning  Atrial flutter - Heart rate is well controlled.  Chronically on Eliquis, on hold  History of HIV - CD4 count 252, continue antiretrovirals  Anemia of chronic disease - Baseline hemoglobin 8.0.  Continue to monitor  History of chronic pain syndrome - On fentanyl patch and methadone    DVT prophylaxis: SCDs Start: 12/24/20 1728 Code Status: Full Family Communication:  None  Status is:  Inpatient  Remains inpatient appropriate because: Ongoing treatment for infected AV fistula with IV antibiotics.   Nutritional status  Nutrition Problem: Severe Malnutrition Etiology: chronic illness (ESRD on HD)  Signs/Symptoms: severe fat depletion, severe muscle depletion  Interventions: Snacks, MVI, Other (Comment) (double protein)  Body mass index is 18.65 kg/m.  Pressure Injury 10/21/19 Sacrum (Active)  10/21/19 1807  Location: Sacrum  Location Orientation:   Staging:   Wound Description (Comments):   Present on Admission:           Subjective: She is having pain in the right upper extremity postoperatively as expected.  Remains afebrile, no other complaints at this time  Review of Systems Otherwise negative except as per HPI, including: General: Denies fever, chills, night sweats or unintended weight loss. Resp: Denies cough, wheezing, shortness of breath. Cardiac: Denies chest pain, palpitations, orthopnea, paroxysmal nocturnal dyspnea. GI: Denies abdominal pain, nausea, vomiting, diarrhea or constipation GU: Denies dysuria, frequency, hesitancy or incontinence MS: Denies muscle aches, joint pain or swelling Neuro: Denies headache, neurologic deficits (focal weakness, numbness, tingling), abnormal gait Psych: Denies anxiety, depression, SI/HI/AVH Skin: Denies new rashes or lesions ID: Denies sick contacts, exotic exposures, travel Examination:  Constitutional: Not in acute distress, cachectic frail Respiratory: Clear to auscultation bilaterally Cardiovascular: Normal sinus rhythm, no rubs Abdomen: Nontender nondistended good bowel sounds Musculoskeletal: No edema noted Skin: Right upper extremity dressing in place Neurologic: CN 2-12 grossly intact.  And nonfocal Psychiatric: Normal judgment and insight. Alert and oriented x 3. Normal mood.  Objective: Vitals:   12/28/20 0853 12/28/20 0900 12/28/20 0930 12/28/20 0945  BP:  (!) 158/91 (!) 144/71    Pulse:  Marland Kitchen)  113 (!) 54 (!) 44  Resp:   12 12  Temp:      TempSrc:      SpO2:   97% 96%  Weight: 47 kg     Height:        Intake/Output Summary (Last 24 hours) at 12/28/2020 1000 Last data filed at 12/27/2020 2205 Gross per 24 hour  Intake 2370.67 ml  Output 350 ml  Net 2020.67 ml   Filed Weights   12/27/20 1329 12/27/20 2020 12/28/20 0853  Weight: 46.1 kg 48.5 kg 47 kg     Data Reviewed:   CBC: Recent Labs  Lab 12/24/20 1518 12/25/20 0409 12/26/20 0508 12/27/20 0156 12/27/20 0958 12/28/20 0247  WBC 9.2 8.1 8.8 5.9  --  9.8  NEUTROABS 7.6 6.1  --   --   --   --   HGB 7.8* 7.3* 7.4* 6.6* 8.2* 10.9*  HCT 25.7* 23.9* 24.2* 21.6* 26.7* 33.0*  MCV 109.8* 107.7* 103.9* 103.3*  --  96.8  PLT 205 194 174 164  --  808   Basic Metabolic Panel: Recent Labs  Lab 12/24/20 1518 12/25/20 0409 12/26/20 0508 12/27/20 0156 12/27/20 0958 12/28/20 0247 12/28/20 0735  NA 132* 131* 130*  --  137  --  137  K 5.3* 4.9 6.2*  --  4.0  --  4.0  CL 92* 91* 93*  --  98  --  100  CO2 23 21* 20*  --  27  --  25  GLUCOSE 68* 61* 72  --  89  --  77  BUN 87* 93* 103*  --  30*  --  40*  CREATININE 10.98* 11.47* 13.14*  --  6.26*  --  7.35*  CALCIUM 8.9 9.0 9.2  --  9.4  --  9.3  MG 2.2  --   --  2.0  --  2.2  --   PHOS  --   --   --   --   --   --  7.6*   GFR: Estimated Creatinine Clearance: 5.5 mL/min (A) (by C-G formula based on SCr of 7.35 mg/dL (H)). Liver Function Tests: Recent Labs  Lab 12/24/20 1518 12/25/20 0409 12/28/20 0735  AST 12* 10*  --   ALT 9 7  --   ALKPHOS 88 87  --   BILITOT 0.6 0.9  --   PROT 7.8 7.1  --   ALBUMIN 2.9* 2.6* 2.3*   No results for input(s): LIPASE, AMYLASE in the last 168 hours. No results for input(s): AMMONIA in the last 168 hours. Coagulation Profile: No results for input(s): INR, PROTIME in the last 168 hours. Cardiac Enzymes: No results for input(s): CKTOTAL, CKMB, CKMBINDEX, TROPONINI in the last 168 hours. BNP (last 3  results) No results for input(s): PROBNP in the last 8760 hours. HbA1C: No results for input(s): HGBA1C in the last 72 hours. CBG: Recent Labs  Lab 12/26/20 1921 12/26/20 2202 12/26/20 2349 12/27/20 0520 12/27/20 1135  GLUCAP 59* 113* 98 87 78   Lipid Profile: No results for input(s): CHOL, HDL, LDLCALC, TRIG, CHOLHDL, LDLDIRECT in the last 72 hours. Thyroid Function Tests: No results for input(s): TSH, T4TOTAL, FREET4, T3FREE, THYROIDAB in the last 72 hours. Anemia Panel: No results for input(s): VITAMINB12, FOLATE, FERRITIN, TIBC, IRON, RETICCTPCT in the last 72 hours. Sepsis Labs: Recent Labs  Lab 12/24/20 1518 12/24/20 1658  LATICACIDVEN 1.6 1.0    Recent Results (from the past 240 hour(s))  Resp Panel by RT-PCR (  Flu A&B, Covid) Nasopharyngeal Swab     Status: None   Collection Time: 12/24/20  2:53 PM   Specimen: Nasopharyngeal Swab; Nasopharyngeal(NP) swabs in vial transport medium  Result Value Ref Range Status   SARS Coronavirus 2 by RT PCR NEGATIVE NEGATIVE Final    Comment: (NOTE) SARS-CoV-2 target nucleic acids are NOT DETECTED.  The SARS-CoV-2 RNA is generally detectable in upper respiratory specimens during the acute phase of infection. The lowest concentration of SARS-CoV-2 viral copies this assay can detect is 138 copies/mL. A negative result does not preclude SARS-Cov-2 infection and should not be used as the sole basis for treatment or other patient management decisions. A negative result may occur with  improper specimen collection/handling, submission of specimen other than nasopharyngeal swab, presence of viral mutation(s) within the areas targeted by this assay, and inadequate number of viral copies(<138 copies/mL). A negative result must be combined with clinical observations, patient history, and epidemiological information. The expected result is Negative.  Fact Sheet for Patients:  EntrepreneurPulse.com.au  Fact Sheet for  Healthcare Providers:  IncredibleEmployment.be  This test is no t yet approved or cleared by the [place] FDA and  has been authorized for detection and/or diagnosis of SARS-CoV-2 by FDA under an Emergency Use Authorization (EUA). This EUA will remain  in effect (meaning this test can be used) for the duration of the COVID-19 declaration under Section 564(b)(1) of the Act, 21 U.S.C.section 360bbb-3(b)(1), unless the authorization is terminated  or revoked sooner.       Influenza A by PCR NEGATIVE NEGATIVE Final   Influenza B by PCR NEGATIVE NEGATIVE Final    Comment: (NOTE) The Xpert Xpress SARS-CoV-2/FLU/RSV plus assay is intended as an aid in the diagnosis of influenza from Nasopharyngeal swab specimens and should not be used as a sole basis for treatment. Nasal washings and aspirates are unacceptable for Xpert Xpress SARS-CoV-2/FLU/RSV testing.  Fact Sheet for Patients: EntrepreneurPulse.com.au  Fact Sheet for Healthcare Providers: IncredibleEmployment.be  This test is not yet approved or cleared by the [place] FDA and has been authorized for detection and/or diagnosis of SARS-CoV-2 by FDA under an Emergency Use Authorization (EUA). This EUA will remain in effect (meaning this test can be used) for the duration of the COVID-19 declaration under Section 564(b)(1) of the Act, 21 U.S.C. section 360bbb-3(b)(1), unless the authorization is terminated or revoked.  Performed at [facility], [address], [place], [place] [address]   Blood culture (routine x 2)     Status: None (Preliminary result)   Collection Time: 12/24/20  3:19 PM   Specimen: BLOOD LEFT HAND  Result Value Ref Range Status   Specimen Description BLOOD LEFT HAND  Final   Special Requests   Final    BOTTLES DRAWN AEROBIC AND ANAEROBIC Blood Culture adequate volume   Culture   Final    NO GROWTH 3 DAYS Performed at [facility], [address], [place], [place] [address]    Report Status PENDING  Incomplete  Blood culture (routine x 2)     Status: None (Preliminary result)   Collection Time: 12/24/20  3:19 PM   Specimen: BLOOD LEFT ARM  Result Value Ref Range Status   Specimen Description BLOOD LEFT ARM  Final   Special Requests   Final    BOTTLES DRAWN AEROBIC AND ANAEROBIC Blood Culture adequate volume   Culture   Final    NO GROWTH 3 DAYS Performed at [facility], [address] [place], [place] [address]  Report Status PENDING  Incomplete  Surgical pcr screen     Status: None   Collection Time: 12/26/20 12:42 PM   Specimen: Nasal Mucosa; Nasal Swab  Result Value Ref Range Status   MRSA, PCR NEGATIVE NEGATIVE Final   Staphylococcus aureus NEGATIVE NEGATIVE Final    Comment: (NOTE) The Xpert SA Assay (FDA approved for NASAL specimens in patients 51 years of age and older), is one component of a comprehensive surveillance program. It is not intended to diagnose infection nor to guide or monitor treatment. Performed at [facility], [place] [address], [place], [place] [address]   Aerobic/Anaerobic Culture w Gram Stain (surgical/deep wound)     Status: None (Preliminary result)   Collection Time: 12/27/20  2:39 PM   Specimen: Wound; Tissue  Result Value Ref Range Status   Specimen Description TISSUE  Final   Special Requests RIGHT ARM WOUND FOR CULTURES SPEC A  Final   Gram Stain   Final    RARE WBC PRESENT, PREDOMINANTLY PMN RARE GRAM POSITIVE COCCI IN PAIRS Performed at [facility], [place] [address], [place],  [address]    Culture PENDING  Incomplete   Report Status PENDING  Incomplete         Radiology Studies: IR Fluoro Guide CV Line Left  Result Date: 12/26/2020 INDICATION: 70 year old female with history of end-stage renal disease in malfunctioning right upper extremity arteriovenous fistula in the setting of right innominate vein stenosis/stent occlusion. Alternate hemodialysis  access is required pending central venous intervention. EXAM: NON-TUNNELED CENTRAL VENOUS HEMODIALYSIS CATHETER PLACEMENT WITH ULTRASOUND AND FLUOROSCOPIC GUIDANCE COMPARISON:  None. MEDICATIONS: None FLUOROSCOPY TIME:  0 minutes, 18 seconds (2 mGy) COMPLICATIONS: None immediate. PROCEDURE: Informed written consent was obtained from the patient after a discussion of the risks, benefits, and alternatives to treatment. Questions regarding the procedure were encouraged and answered. The left neck and chest were prepped with chlorhexidine in a sterile fashion, and a sterile drape was applied covering the operative field. Maximum barrier sterile technique with sterile gowns and gloves were used for the procedure. A timeout was performed prior to the initiation of the procedure. After the overlying soft tissues were anesthetized, a small venotomy incision was created and a micropuncture kit was utilized to access the internal jugular vein. Real-time ultrasound guidance was utilized for vascular access including the acquisition of a permanent ultrasound image documenting patency of the accessed vessel. The microwire was utilized to measure appropriate catheter length. A Rosen wire was advanced to the level of the IVC. Under fluoroscopic guidance, the venotomy was serially dilated, ultimately allowing placement of a 20 cm temporary Trialysis catheter with tip ultimately terminating within the superior aspect of the right atrium. Final catheter positioning was confirmed and documented with a spot radiographic image. The catheter aspirates and flushes normally. The catheter was flushed with appropriate volume heparin dwells. The catheter exit site was secured with a 0-Silk retention suture. A dressing was placed. The patient tolerated the procedure well without immediate post procedural complication. IMPRESSION: Successful placement of a left internal jugular approach 20 cm temporary dialysis catheter with tip terminating  with in the superior aspect of the right atrium. The catheter is ready for immediate use. PLAN: This catheter may be converted to a tunneled dialysis catheter at a later date as indicated. Ruthann Cancer, MD [facility] Electronically Signed   By: Ruthann Cancer M.D.   On: 12/26/2020 12:45   IR US Guide Vasc  Access Left  Result Date: 12/26/2020 INDICATION: 70 year old female with history of end-stage renal disease in malfunctioning right upper extremity arteriovenous fistula in the setting of right innominate vein stenosis/stent occlusion. Alternate hemodialysis access is required pending central venous intervention. EXAM: NON-TUNNELED CENTRAL VENOUS HEMODIALYSIS CATHETER PLACEMENT WITH ULTRASOUND AND FLUOROSCOPIC GUIDANCE COMPARISON:  None. MEDICATIONS: None FLUOROSCOPY TIME:  0 minutes, 18 seconds (2 mGy) COMPLICATIONS: None immediate. PROCEDURE: Informed written consent was obtained from the patient after a discussion of the risks, benefits, and alternatives to treatment. Questions regarding the procedure were encouraged and answered. The left neck and chest were prepped with chlorhexidine in a sterile fashion, and a sterile drape was applied covering the operative field. Maximum barrier sterile technique with sterile gowns and gloves were used for the procedure. A timeout was performed prior to the initiation of the procedure. After the overlying soft tissues were anesthetized, a small venotomy incision was created and a micropuncture kit was utilized to access the internal jugular vein. Real-time ultrasound guidance was utilized for vascular access including the acquisition of a permanent ultrasound image documenting patency of the accessed vessel. The microwire was utilized to measure appropriate catheter length. A Rosen wire was advanced to the level of the IVC. Under fluoroscopic guidance, the venotomy was serially dilated, ultimately allowing  placement of a 20 cm temporary Trialysis catheter with tip ultimately terminating within the superior aspect of the right atrium. Final catheter positioning was confirmed and documented with a spot radiographic image. The catheter aspirates and flushes normally. The catheter was flushed with appropriate volume heparin dwells. The catheter exit site was secured with a 0-Silk retention suture. A dressing was placed. The patient tolerated the procedure well without immediate post procedural complication. IMPRESSION: Successful placement of a left internal jugular approach 20 cm temporary dialysis catheter with tip terminating with in the superior aspect of the right atrium. The catheter is ready for immediate use. PLAN: This catheter may be converted to a tunneled dialysis catheter at a later date as indicated. Ruthann Cancer, MD [facility] Electronically Signed   By: Ruthann Cancer M.D.   On: 12/26/2020 12:45        Scheduled Meds:  calcitRIOL  1.25 mcg Oral Q T,Th,Sa-HD   Chlorhexidine Gluconate Cloth  6 each Topical Q0600   cinacalcet  60 mg Oral Q T,Th,Sa-HD   darbepoetin (ARANESP) injection - DIALYSIS  200 mcg Intravenous Q Thu-HD   dolutegravir  50 mg Oral q1600   fentaNYL  1 patch Transdermal Q72H   lamiVUDine  50 mg Oral Daily   methadone  2.5 mg Oral Q2000   metoprolol tartrate  25 mg Oral BID   mirtazapine  15 mg Oral QHS   multivitamin  1 tablet Oral QHS   pantoprazole  40 mg Oral QAC breakfast   [START ON 12/31/2020] vancomycin  500 mg Intravenous Q T,Th,Sa-HD   venlafaxine XR  150 mg Oral Q breakfast   zidovudine  300 mg Oral Daily   Continuous Infusions:  ceFEPime (MAXIPIME) IV 1 g (12/27/20 2205)     LOS: 4 days   Time spent= 35 mins    Kimber Fritts Arsenio Loader, MD [facility]  If 7PM-7AM, please contact night-coverage  12/28/2020, 10:00 AM

## 2020-12-28 NOTE — Progress Notes (Signed)
Patient ID: [name], female   DOB: 1951/01/19, 70 y.o.   MRN: [id number] S: seen on HD. Had RAVF ligated and washed out yesterday.  A fib with RVR on HD 130-140s, req'd IV metoprolol.  She c/o post op pain o/w no complaints   O:BP (!) 110/57   Pulse 71   Temp 97.8 F (36.6 C) (Oral)   Resp 20   Ht 5' 2.5" (1.588 m)   Wt 47 kg   LMP  (LMP Unknown)   SpO2 99%   BMI 18.65 kg/m   Intake/Output Summary (Last 24 hours) at 12/28/2020 1050 Last data filed at 12/27/2020 2205 Gross per 24 hour  Intake 2370.67 ml  Output 350 ml  Net 2020.67 ml    Intake/Output: I/O last 3 completed shifts: In: 2590.8 [P.O.:360; I.V.:200.2; Blood:1630.7; IV Piggyback:400] Out: 350 [Blood:350]  Intake/Output this shift:  No intake/output data recorded. Weight change: -1.8 kg Gen:NAD-  left temp cath CVS: currently A fib with RVR on monitor 130s Resp:CTA Abd: +BS,s oft, NT/ND Ext: RUE with dressing from wrist to mid bicep.  Has bullous enlargement between 2nd and 3rd digit w/o purulence or erythema.   Recent Labs  Lab 12/24/20 1518 12/25/20 0409 12/26/20 0508 12/27/20 0958 12/28/20 0735  NA 132* 131* 130* 137 137  K 5.3* 4.9 6.2* 4.0 4.0  CL 92* 91* 93* 98 100  CO2 23 21* 20* 27 25  GLUCOSE 68* 61* 72 89 77  BUN 87* 93* 103* 30* 40*  CREATININE 10.98* 11.47* 13.14* 6.26* 7.35*  ALBUMIN 2.9* 2.6*  --   --  2.3*  CALCIUM 8.9 9.0 9.2 9.4 9.3  PHOS  --   --   --   --  7.6*  AST 12* 10*  --   --   --   ALT 9 7  --   --   --     Liver Function Tests: Recent Labs  Lab 12/24/20 1518 12/25/20 0409 12/28/20 0735  AST 12* 10*  --   ALT 9 7  --   ALKPHOS 88 87  --   BILITOT 0.6 0.9  --   PROT 7.8 7.1  --   ALBUMIN 2.9* 2.6* 2.3*    No results for input(s): LIPASE, AMYLASE in the last 168 hours. No results for input(s): AMMONIA in the last 168 hours. CBC: Recent Labs  Lab 12/24/20 1518 12/25/20 0409 12/26/20 0508 12/27/20 0156 12/27/20 0958 12/28/20 0247  WBC 9.2 8.1  8.8 5.9  --  9.8  NEUTROABS 7.6 6.1  --   --   --   --   HGB 7.8* 7.3* 7.4* 6.6* 8.2* 10.9*  HCT 25.7* 23.9* 24.2* 21.6* 26.7* 33.0*  MCV 109.8* 107.7* 103.9* 103.3*  --  96.8  PLT 205 194 174 164  --  161    Cardiac Enzymes: No results for input(s): CKTOTAL, CKMB, CKMBINDEX, TROPONINI in the last 168 hours. CBG: Recent Labs  Lab 12/26/20 1921 12/26/20 2202 12/26/20 2349 12/27/20 0520 12/27/20 1135  GLUCAP 59* 113* 98 87 78     Iron Studies: No results for input(s): IRON, TIBC, TRANSFERRIN, FERRITIN in the last 72 hours. Studies/Results: IR Fluoro Guide CV Line Left  Result Date: 12/26/2020 INDICATION: 70 year old female with history of end-stage renal disease in malfunctioning right upper extremity arteriovenous fistula in the setting of right innominate vein stenosis/stent occlusion. Alternate hemodialysis access is required pending central venous intervention. EXAM: NON-TUNNELED CENTRAL VENOUS HEMODIALYSIS CATHETER PLACEMENT WITH ULTRASOUND AND FLUOROSCOPIC GUIDANCE  COMPARISON:  None. MEDICATIONS: None FLUOROSCOPY TIME:  0 minutes, 18 seconds (2 mGy) COMPLICATIONS: None immediate. PROCEDURE: Informed written consent was obtained from the patient after a discussion of the risks, benefits, and alternatives to treatment. Questions regarding the procedure were encouraged and answered. The left neck and chest were prepped with chlorhexidine in a sterile fashion, and a sterile drape was applied covering the operative field. Maximum barrier sterile technique with sterile gowns and gloves were used for the procedure. A timeout was performed prior to the initiation of the procedure. After the overlying soft tissues were anesthetized, a small venotomy incision was created and a micropuncture kit was utilized to access the internal jugular vein. Real-time ultrasound guidance was utilized for vascular access including the acquisition of a permanent ultrasound image documenting patency of the  accessed vessel. The microwire was utilized to measure appropriate catheter length. A Rosen wire was advanced to the level of the IVC. Under fluoroscopic guidance, the venotomy was serially dilated, ultimately allowing placement of a 20 cm temporary Trialysis catheter with tip ultimately terminating within the superior aspect of the right atrium. Final catheter positioning was confirmed and documented with a spot radiographic image. The catheter aspirates and flushes normally. The catheter was flushed with appropriate volume heparin dwells. The catheter exit site was secured with a 0-Silk retention suture. A dressing was placed. The patient tolerated the procedure well without immediate post procedural complication. IMPRESSION: Successful placement of a left internal jugular approach 20 cm temporary dialysis catheter with tip terminating with in the superior aspect of the right atrium. The catheter is ready for immediate use. PLAN: This catheter may be converted to a tunneled dialysis catheter at a later date as indicated. Ruthann Cancer, MD [facility] Radiology Electronically Signed   By: Ruthann Cancer M.D.   On: 12/26/2020 12:45   IR US Guide Vasc Access Left  Result Date: 12/26/2020 INDICATION: 70 year old female with history of end-stage renal disease in malfunctioning right upper extremity arteriovenous fistula in the setting of right innominate vein stenosis/stent occlusion. Alternate hemodialysis access is required pending central venous intervention. EXAM: NON-TUNNELED CENTRAL VENOUS HEMODIALYSIS CATHETER PLACEMENT WITH ULTRASOUND AND FLUOROSCOPIC GUIDANCE COMPARISON:  None. MEDICATIONS: None FLUOROSCOPY TIME:  0 minutes, 18 seconds (2 mGy) COMPLICATIONS: None immediate. PROCEDURE: Informed written consent was obtained from the patient after a discussion of the risks, benefits, and alternatives to treatment. Questions regarding the procedure were  encouraged and answered. The left neck and chest were prepped with chlorhexidine in a sterile fashion, and a sterile drape was applied covering the operative field. Maximum barrier sterile technique with sterile gowns and gloves were used for the procedure. A timeout was performed prior to the initiation of the procedure. After the overlying soft tissues were anesthetized, a small venotomy incision was created and a micropuncture kit was utilized to access the internal jugular vein. Real-time ultrasound guidance was utilized for vascular access including the acquisition of a permanent ultrasound image documenting patency of the accessed vessel. The microwire was utilized to measure appropriate catheter length. A Rosen wire was advanced to the level of the IVC. Under fluoroscopic guidance, the venotomy was serially dilated, ultimately allowing placement of a 20 cm temporary Trialysis catheter with tip ultimately terminating within the superior aspect of the right atrium. Final catheter positioning was confirmed and documented with a spot radiographic image. The catheter aspirates and flushes normally. The catheter was flushed with appropriate volume heparin dwells. The catheter exit site was secured  with a 0-Silk retention suture. A dressing was placed. The patient tolerated the procedure well without immediate post procedural complication. IMPRESSION: Successful placement of a left internal jugular approach 20 cm temporary dialysis catheter with tip terminating with in the superior aspect of the right atrium. The catheter is ready for immediate use. PLAN: This catheter may be converted to a tunneled dialysis catheter at a later date as indicated. Ruthann Cancer, MD [facility] Radiology Electronically Signed   By: Ruthann Cancer M.D.   On: 12/26/2020 12:45    calcitRIOL  1.25 mcg Oral Q T,Th,Sa-HD   Chlorhexidine Gluconate Cloth  6 each Topical Q0600   cinacalcet  60  mg Oral Q T,Th,Sa-HD   darbepoetin (ARANESP) injection - DIALYSIS  200 mcg Intravenous Q Thu-HD   dolutegravir  50 mg Oral q1600   fentaNYL  1 patch Transdermal Q72H   lamiVUDine  50 mg Oral Daily   methadone  2.5 mg Oral Q2000   metoprolol tartrate  25 mg Oral BID   mirtazapine  15 mg Oral QHS   multivitamin  1 tablet Oral QHS   pantoprazole  40 mg Oral QAC breakfast   [START ON 12/31/2020] vancomycin  500 mg Intravenous Q T,Th,Sa-HD   venlafaxine XR  150 mg Oral Q breakfast   zidovudine  300 mg Oral Daily    BMET    Component Value Date/Time   NA 137 12/28/2020 0735   K 4.0 12/28/2020 0735   CL 100 12/28/2020 0735   CO2 25 12/28/2020 0735   GLUCOSE 77 12/28/2020 0735   BUN 40 (H) 12/28/2020 0735   CREATININE 7.35 (H) 12/28/2020 0735   CREATININE 5.66 (H) 12/22/2019 1117   CALCIUM 9.3 12/28/2020 0735   CALCIUM 9.0 03/12/2011 0527   GFRNONAA 6 (L) 12/28/2020 0735   GFRNONAA 5 (L) 02/12/2015 1000   GFRAA 9 (L) 10/22/2019 0105   GFRAA 5 (L) 02/12/2015 1000   CBC    Component Value Date/Time   WBC 9.8 12/28/2020 0247   RBC 3.41 (L) 12/28/2020 0247   HGB 10.9 (L) 12/28/2020 0247   HCT 33.0 (L) 12/28/2020 0247   PLT 161 12/28/2020 0247   MCV 96.8 12/28/2020 0247   MCH 32.0 12/28/2020 0247   MCHC 33.0 12/28/2020 0247   RDW 20.5 (H) 12/28/2020 0247   LYMPHSABS 1.1 12/25/2020 0409   MONOABS 0.7 12/25/2020 0409   EOSABS 0.1 12/25/2020 0409   BASOSABS 0.0 12/25/2020 0409    Dialysis Orders: Center: [facility]  on TTS . EDW 41.5kg HD Bath 2K/2.5Ca  Time 3 hours Heparin 1000 units bolus then 500 units/hr. 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  appreciate vascular surgery evaluation-  s/p ligation and debridement 10/28.  On abx.   ESRD -  Last HD was 10/18 then 10/27 in hospital.  HD  today on TTS schedule outpt.  WIll need temp HD catheter changed prior to d/c. Requiring IR complete Monday.   Hypertension/volume  - stable- no BP meds-  Uf as able-   max of 3.5 liters  Anemia  - Hb 10s. continue with ESA.    Metabolic bone disease -  continue with calcitriol and sensipar-  I dont see a binder on her med  list  Nutrition - renal diet. Hypoglycemia - PRN D50  AFib/FL:  having RVR on HD = getting IV metoprolol  HIV on meds   [name]  [name] [phone]

## 2020-12-29 ENCOUNTER — Encounter (HOSPITAL_COMMUNITY): Payer: Self-pay | Admitting: Vascular Surgery

## 2020-12-29 DIAGNOSIS — B20 Human immunodeficiency virus [HIV] disease: Secondary | ICD-10-CM | POA: Diagnosis not present

## 2020-12-29 DIAGNOSIS — L089 Local infection of the skin and subcutaneous tissue, unspecified: Secondary | ICD-10-CM | POA: Diagnosis not present

## 2020-12-29 DIAGNOSIS — I10 Essential (primary) hypertension: Secondary | ICD-10-CM | POA: Diagnosis not present

## 2020-12-29 DIAGNOSIS — N186 End stage renal disease: Secondary | ICD-10-CM | POA: Diagnosis not present

## 2020-12-29 LAB — CBC
HCT: 34 % — ABNORMAL LOW (ref 36.0–46.0)
Hemoglobin: 10.9 g/dL — ABNORMAL LOW (ref 12.0–15.0)
MCH: 31.3 pg (ref 26.0–34.0)
MCHC: 32.1 g/dL (ref 30.0–36.0)
MCV: 97.7 fL (ref 80.0–100.0)
Platelets: 174 10*3/uL (ref 150–400)
RBC: 3.48 MIL/uL — ABNORMAL LOW (ref 3.87–5.11)
RDW: 19.9 % — ABNORMAL HIGH (ref 11.5–15.5)
WBC: 10.6 10*3/uL — ABNORMAL HIGH (ref 4.0–10.5)
nRBC: 0 % (ref 0.0–0.2)

## 2020-12-29 LAB — CULTURE, BLOOD (ROUTINE X 2)
Culture: NO GROWTH
Culture: NO GROWTH
Special Requests: ADEQUATE
Special Requests: ADEQUATE

## 2020-12-29 LAB — AEROBIC CULTURE W GRAM STAIN (SUPERFICIAL SPECIMEN)

## 2020-12-29 LAB — GLUCOSE, CAPILLARY
Glucose-Capillary: 70 mg/dL (ref 70–99)
Glucose-Capillary: 83 mg/dL (ref 70–99)
Glucose-Capillary: 100 mg/dL — ABNORMAL HIGH (ref 70–99)

## 2020-12-29 LAB — APTT: aPTT: 35 seconds (ref 24–36)

## 2020-12-29 LAB — HEPARIN LEVEL (UNFRACTIONATED)
Heparin Unfractionated: 0.11 IU/mL — ABNORMAL LOW (ref 0.30–0.70)
Heparin Unfractionated: 0.2 IU/mL — ABNORMAL LOW (ref 0.30–0.70)

## 2020-12-29 LAB — MAGNESIUM: Magnesium: 2 mg/dL (ref 1.7–2.4)

## 2020-12-29 MED ORDER — METOPROLOL TARTRATE 50 MG PO TABS
50.0000 mg | ORAL_TABLET | Freq: Two times a day (BID) | ORAL | Status: DC
  Administered 2020-12-29 – 2021-01-09 (×21): 50 mg via ORAL
  Filled 2020-12-29 (×25): qty 1

## 2020-12-29 MED ORDER — VANCOMYCIN HCL IN DEXTROSE 1-5 GM/200ML-% IV SOLN
1000.0000 mg | INTRAVENOUS | Status: AC
  Administered 2020-12-30: 1000 mg via INTRAVENOUS
  Filled 2020-12-29: qty 200

## 2020-12-29 MED ORDER — HEPARIN (PORCINE) 25000 UT/250ML-% IV SOLN
800.0000 [IU]/h | INTRAVENOUS | Status: DC
  Administered 2020-12-29: 650 [IU]/h via INTRAVENOUS
  Filled 2020-12-29: qty 250

## 2020-12-29 NOTE — Progress Notes (Signed)
PROGRESS NOTE    [name]  MHD:[id number] DOB: August 13, 1950 DOA: 12/24/2020 PCP: Hilbert Corrigan, MD   Brief Narrative:  70 year old with history of ESRD on HD Tuesday Thursday Saturday, HIV, a flutter on Eliquis comes to the hospital with complaints of right upper extremity AV fistula infection.  She had an AV fistula occlusion about 4 weeks ago and underwent balloon angioplasty of the right subclavian and right innominate vein with conversion to right forearm fistula to right upper extremity fistula.  She initially went to [facility] where she was diagnosed with AV fistula infection and transferred to [facility].  Vascular, nephro, IR and infectious disease are consulted.  Patient underwent ligation of AV fistula and I&D on 12/27/2020. Temp HD cath was placed by IR.    Assessment & Plan:   Active Problems:   Human immunodeficiency virus (HIV) disease ([place])   Essential hypertension   ESRD on dialysis ([place])   Atrial flutter ([place])   Anemia of chronic disease   Infected fluid collection with fistula   Hypoglycemia   Right AV fistula infection now with purulent discharge - On Vanc/Cefepime, adjust per Cx data and ID recs - Vascular performed I&D/ligation of AV fistula on 12/19/2020.  Dressing management per their service. Ok to start [facility] per vasc.  -Infectious disease following, Wound Cx shows Staph Aureus. Bcx NGTD.  - Pain control, dry dressing.  ESRD on hemodialysis TTS - Temporary HD cath in place, continue dialysis per nephrology. Will eventually need this to be changed to Tunneled cath? Anticoag is on hold. Should be resumed once no further procedures are planned.   Hyperkalemia -Resolved.    Atrial flutter with rvr - Increase Metoprolol to 50mg  BID  Chronically on Eliquis, Will start heparin drip without bolus.   History of HIV - CD4 count 252, continue antiretrovirals  Anemia of chronic disease - Baseline hemoglobin 8.0.  Continue to  monitor  History of chronic pain syndrome - On fentanyl patch and methadone  DVT prophylaxis: Hep Drip Code Status: Full Family Communication:  None  Status is: Inpatient  Remains inpatient appropriate because: Ongoing treatment for infected AV fistula with IV antibiotics.   Nutritional status  Nutrition Problem: Severe Malnutrition Etiology: chronic illness (ESRD on HD)  Signs/Symptoms: severe fat depletion, severe muscle depletion  Interventions: Snacks, MVI, Other (Comment) (double protein)  Body mass index is 18.13 kg/m.  Pressure Injury 10/21/19 Sacrum (Active)  10/21/19 1807  Location: Sacrum  Location Orientation:   Staging:   Wound Description (Comments):   Present on Admission:      Subjective: Pain is better in the right arm, no acute complaints.   Review of Systems Otherwise negative except as per HPI, including: General = no fevers, chills, dizziness,  fatigue HEENT/EYES = negative for loss of vision, double vision, blurred vision,  sore throa Cardiovascular= negative for chest pain, palpitation Respiratory/lungs= negative for shortness of breath, cough, wheezing; hemoptysis,  Gastrointestinal= negative for nausea, vomiting, abdominal pain Genitourinary= negative for Dysuria MSK = Negative for arthralgia, myalgias Neurology= Negative for headache, numbness, tingling  Psychiatry= Negative for suicidal and homocidal ideation Skin= Negative for Rash  Examination:  Constitutional: Not in acute distress Respiratory: Clear to auscultation bilaterally Cardiovascular: Irregular rate and rhythm.  Abdomen: Nontender nondistended good bowel sounds Musculoskeletal: No edema noted Skin: RUE dressing in place.  Neurologic: CN 2-12 grossly intact.  And nonfocal Psychiatric: Normal judgment and insight. Alert and oriented x 3. Normal mood.    Objective:  Vitals:   12/28/20 2141 12/28/20 2142 12/29/20 0538 12/29/20 0743  BP: (!) 127/109 (!) 127/109 (!)  140/94 (!) 149/109  Pulse: (!) 111 (!) 109 (!) 131 (!) 133  Resp:  16 20   Temp:  98.7 F (37.1 C) 98.4 F (36.9 C)   TempSrc:  Oral Oral   SpO2: 98%  95% 96%  Weight:      Height:        Intake/Output Summary (Last 24 hours) at 12/29/2020 0807 Last data filed at 12/29/2020 0558 Gross per 24 hour  Intake 400 ml  Output 2180 ml  Net -1780 ml   Filed Weights   12/27/20 2020 12/28/20 0853 12/28/20 1149  Weight: 48.5 kg 47 kg 45.7 kg     Data Reviewed:   CBC: Recent Labs  Lab 12/24/20 1518 12/25/20 0409 12/26/20 0508 12/27/20 0156 12/27/20 0958 12/28/20 0247  WBC 9.2 8.1 8.8 5.9  --  9.8  NEUTROABS 7.6 6.1  --   --   --   --   HGB 7.8* 7.3* 7.4* 6.6* 8.2* 10.9*  HCT 25.7* 23.9* 24.2* 21.6* 26.7* 33.0*  MCV 109.8* 107.7* 103.9* 103.3*  --  96.8  PLT 205 194 174 164  --  604   Basic Metabolic Panel: Recent Labs  Lab 12/24/20 1518 12/25/20 0409 12/26/20 0508 12/27/20 0156 12/27/20 0958 12/28/20 0247 12/28/20 0735  NA 132* 131* 130*  --  137  --  137  K 5.3* 4.9 6.2*  --  4.0  --  4.0  CL 92* 91* 93*  --  98  --  100  CO2 23 21* 20*  --  27  --  25  GLUCOSE 68* 61* 72  --  89  --  77  BUN 87* 93* 103*  --  30*  --  40*  CREATININE 10.98* 11.47* 13.14*  --  6.26*  --  7.35*  CALCIUM 8.9 9.0 9.2  --  9.4  --  9.3  MG 2.2  --   --  2.0  --  2.2  --   PHOS  --   --   --   --   --   --  7.6*   GFR: Estimated Creatinine Clearance: 5.4 mL/min (A) (by C-G formula based on SCr of 7.35 mg/dL (H)). Liver Function Tests: Recent Labs  Lab 12/24/20 1518 12/25/20 0409 12/28/20 0735  AST 12* 10*  --   ALT 9 7  --   ALKPHOS 88 87  --   BILITOT 0.6 0.9  --   PROT 7.8 7.1  --   ALBUMIN 2.9* 2.6* 2.3*   No results for input(s): LIPASE, AMYLASE in the last 168 hours. No results for input(s): AMMONIA in the last 168 hours. Coagulation Profile: No results for input(s): INR, PROTIME in the last 168 hours. Cardiac Enzymes: No results for input(s): CKTOTAL, CKMB,  CKMBINDEX, TROPONINI in the last 168 hours. BNP (last 3 results) No results for input(s): PROBNP in the last 8760 hours. HbA1C: No results for input(s): HGBA1C in the last 72 hours. CBG: Recent Labs  Lab 12/28/20 1053 12/28/20 1619 12/28/20 2033 12/29/20 0426 12/29/20 0645  GLUCAP 86 101* 84 70 83   Lipid Profile: No results for input(s): CHOL, HDL, LDLCALC, TRIG, CHOLHDL, LDLDIRECT in the last 72 hours. Thyroid Function Tests: No results for input(s): TSH, T4TOTAL, FREET4, T3FREE, THYROIDAB in the last 72 hours. Anemia Panel: No results for input(s): VITAMINB12, FOLATE, FERRITIN, TIBC, IRON, RETICCTPCT in the  last 72 hours. Sepsis Labs: Recent Labs  Lab 12/24/20 1518 12/24/20 1658  LATICACIDVEN 1.6 1.0    Recent Results (from the past 240 hour(s))  Resp Panel by RT-PCR (Flu A&B, Covid) Nasopharyngeal Swab     Status: None   Collection Time: 12/24/20  2:53 PM   Specimen: Nasopharyngeal Swab; Nasopharyngeal(NP) swabs in vial transport medium  Result Value Ref Range Status   SARS Coronavirus 2 by RT PCR NEGATIVE NEGATIVE Final    Comment: (NOTE) SARS-CoV-2 target nucleic acids are NOT DETECTED.  The SARS-CoV-2 RNA is generally detectable in upper respiratory specimens during the acute phase of infection. The lowest concentration of SARS-CoV-2 viral copies this assay can detect is 138 copies/mL. A negative result does not preclude SARS-Cov-2 infection and should not be used as the sole basis for treatment or other patient management decisions. A negative result may occur with  improper specimen collection/handling, submission of specimen other than nasopharyngeal swab, presence of viral mutation(s) within the areas targeted by this assay, and inadequate number of viral copies(<138 copies/mL). A negative result must be combined with clinical observations, patient history, and epidemiological information. The expected result is Negative.  Fact Sheet for Patients:   EntrepreneurPulse.com.au  Fact Sheet for Healthcare Providers:  IncredibleEmployment.be  This test is no t yet approved or cleared by the [place] FDA and  has been authorized for detection and/or diagnosis of SARS-CoV-2 by FDA under an Emergency Use Authorization (EUA). This EUA will remain  in effect (meaning this test can be used) for the duration of the COVID-19 declaration under Section 564(b)(1) of the Act, 21 U.S.C.section 360bbb-3(b)(1), unless the authorization is terminated  or revoked sooner.       Influenza A by PCR NEGATIVE NEGATIVE Final   Influenza B by PCR NEGATIVE NEGATIVE Final    Comment: (NOTE) The Xpert Xpress SARS-CoV-2/FLU/RSV plus assay is intended as an aid in the diagnosis of influenza from Nasopharyngeal swab specimens and should not be used as a sole basis for treatment. Nasal washings and aspirates are unacceptable for Xpert Xpress SARS-CoV-2/FLU/RSV testing.  Fact Sheet for Patients: EntrepreneurPulse.com.au  Fact Sheet for Healthcare Providers: IncredibleEmployment.be  This test is not yet approved or cleared by the [place] FDA and has been authorized for detection and/or diagnosis of SARS-CoV-2 by FDA under an Emergency Use Authorization (EUA). This EUA will remain in effect (meaning this test can be used) for the duration of the COVID-19 declaration under Section 564(b)(1) of the Act, 21 U.S.C. section 360bbb-3(b)(1), unless the authorization is terminated or revoked.  Performed at [facility], [address], [place], [place] [address]   Blood culture (routine x 2)     Status: None (Preliminary result)   Collection Time: 12/24/20  3:19 PM   Specimen: BLOOD LEFT HAND  Result Value Ref Range Status   Specimen Description BLOOD LEFT HAND  Final   Special Requests   Final    BOTTLES DRAWN AEROBIC AND ANAEROBIC Blood Culture adequate volume   Culture   Final     NO GROWTH 4 DAYS Performed at [facility], [address] [place], [place] [address]    Report Status PENDING  Incomplete  Blood culture (routine x 2)     Status: None (Preliminary result)   Collection Time: 12/24/20  3:19 PM   Specimen: BLOOD LEFT ARM  Result Value Ref Range Status   Specimen Description BLOOD LEFT ARM  Final   Special Requests   Final    BOTTLES  DRAWN AEROBIC AND ANAEROBIC Blood Culture adequate volume   Culture   Final    NO GROWTH 4 DAYS Performed at [facility], [address], [place], [place] [address]    Report Status PENDING  Incomplete  Surgical pcr screen     Status: None   Collection Time: 12/26/20 12:42 PM   Specimen: Nasal Mucosa; Nasal Swab  Result Value Ref Range Status   MRSA, PCR NEGATIVE NEGATIVE Final   Staphylococcus aureus NEGATIVE NEGATIVE Final    Comment: (NOTE) The Xpert SA Assay (FDA approved for NASAL specimens in patients 59 years of age and older), is one component of a comprehensive surveillance program. It is not intended to diagnose infection nor to guide or monitor treatment. Performed at [facility], [place] [address] [place], [place] [address]   Aerobic Culture w Gram Stain (superficial specimen)     Status: None (Preliminary result)   Collection Time: 12/27/20 12:19 PM   Specimen: Abscess; Wound  Result Value Ref Range Status   Specimen Description ABSCESS  Final   Special Requests ARM RIGHT  Final   Gram Stain PENDING  Incomplete   Culture   Final    MODERATE STAPHYLOCOCCUS AUREUS SUSCEPTIBILITIES TO FOLLOW Performed at [facility], [place] [address] [place], [place] [address]    Report Status PENDING  Incomplete  Aerobic/Anaerobic Culture w Gram Stain (surgical/deep wound)     Status: None (Preliminary result)   Collection Time: 12/27/20  2:39 PM   Specimen: Wound; Tissue  Result Value Ref Range Status   Specimen Description TISSUE  Final   Special Requests RIGHT ARM WOUND FOR CULTURES SPEC A  Final    Gram Stain   Final    RARE WBC PRESENT, PREDOMINANTLY PMN RARE GRAM POSITIVE COCCI IN PAIRS    Culture   Final    RARE STAPHYLOCOCCUS AUREUS CULTURE REINCUBATED FOR BETTER GROWTH Performed at [facility], [place] [address], [place],  [address]    Report Status PENDING  Incomplete         Radiology Studies: No results found.      Scheduled Meds:  calcitRIOL  1.25 mcg Oral Q T,Th,Sa-HD   Chlorhexidine Gluconate Cloth  6 each Topical Q0600   cinacalcet  60 mg Oral Q T,Th,Sa-HD   darbepoetin (ARANESP) injection - DIALYSIS  200 mcg Intravenous Q Thu-HD   dolutegravir  50 mg Oral q1600   fentaNYL  1 patch Transdermal Q72H   lamiVUDine  50 mg Oral Daily   methadone  2.5 mg Oral Q2000   metoprolol tartrate  50 mg Oral BID   mirtazapine  15 mg Oral QHS   multivitamin  1 tablet Oral QHS   pantoprazole  40 mg Oral QAC breakfast   [START ON 12/31/2020] vancomycin  500 mg Intravenous Q T,Th,Sa-HD   venlafaxine XR  150 mg Oral Q breakfast   zidovudine  300 mg Oral Daily   Continuous Infusions:  ceFEPime (MAXIPIME) IV 1 g (12/28/20 2029)     LOS: 5 days   Time spent= 35 mins    Gaelyn Tukes Arsenio Loader, MD [facility]  If 7PM-7AM, please contact night-coverage  12/29/2020, 8:07 AM

## 2020-12-29 NOTE — Progress Notes (Signed)
   12/29/20 0538  Assess: MEWS Score  Temp 98.4 F (36.9 C)  BP (!) 140/94  Pulse Rate (!) 131  Resp 20  SpO2 95 %  O2 Device Room Air  Assess: MEWS Score  MEWS Temp 0  MEWS Systolic 0  MEWS Pulse 3  MEWS RR 0  MEWS LOC 0  MEWS Score 3  MEWS Score Color Yellow  Assess: if the MEWS score is Yellow or Red  Were vital signs taken at a resting state? Yes  Focused Assessment No change from prior assessment  Early Detection of Sepsis Score *See Row Information* Low  MEWS guidelines implemented *See Row Information* No, previously yellow, continue vital signs every 4 hours  Take Vital Signs  Increase Vital Sign Frequency  Yellow: Q 2hr X 2 then Q 4hr X 2, if remains yellow, continue Q 4hrs  Escalate  MEWS: Escalate Yellow: discuss with charge nurse/RN and consider discussing with provider and RRT  Notify: Charge Nurse/RN  Name of Charge Nurse/RN Notified Janelle RN  Date Charge Nurse/RN Notified 12/29/20  Time Charge Nurse/RN Notified [phone]  Document  Patient Outcome Other (Comment) (pt pulse has been elevated and fluctuating, MD is already aware)  Progress note created (see row info) Yes

## 2020-12-29 NOTE — NC FL2 (Signed)
[place] MEDICAID FL2 LEVEL OF CARE SCREENING TOOL     IDENTIFICATION  Patient Name: [name] Birthdate: 1950-07-06 Sex: female Admission Date (Current Location): 12/24/2020  [facility] and [place] Number:  Herbalist and Address:  [facility], [place] [address], [place], [place] [address]      Provider Number: [id number]  Attending Physician Name and Address:  Damita Lack, MD  Relative Name and Phone Number:  [place], [place] [phone]    Current Level of Care: Hospital Recommended Level of Care: [place] Prior Approval Number:    Date Approved/Denied:   PASRR Number: [id number]  Discharge Plan: SNF    Current Diagnoses: Patient Active Problem List   Diagnosis Date Noted   Hypoglycemia 12/25/2020   Infected fluid collection with fistula 12/24/2020   Dysphagia 12/16/2020   Loss of weight 12/16/2020   Abdominal pain, epigastric 12/16/2020   Protein-calorie malnutrition, severe 11/20/2020   Chest pain of uncertain etiology    Altered mental status    ESRD (end stage renal disease) ([place]) 11/11/2020   Pancreatic abnormality 08/05/2020   Gastritis without bleeding 08/05/2020   Dilated pancreatic duct 05/15/2020   Abnormal MRI of abdomen 05/15/2020   Common bile duct dilation    Lobar pneumonia ([place]) 07/20/2019   Acute respiratory failure with hypoxia ([place]) 07/20/2019   Colitis 07/19/2019   Dilation of common bile duct 07/19/2019   Ventral hernia without obstruction or gangrene    Hypocalcemia 03/07/2019   Other pancytopenia ([place]) 12/08/2018   Anemia of chronic disease 04/17/2016   Hepatitis B immune 01/28/2016   Atrial flutter ([place]) 02/22/2015   Hyperkalemia 02/20/2015   Venous stenosis of right upper extremity 04/06/2014   ESRD on dialysis ([place]) 04/06/2014   Essential hypertension    Nausea and vomiting 12/15/2012   CHF, chronic ([place]) 11/25/2012   Atrial fibrillation ([place]) 05/02/2012    Intracranial bleed ([place]) 12/28/2011   Angioedema of lips 09/21/2011   Cellulitis of breast 03/09/2011   Erosive esophagitis 12/15/2010   Mallory - Weiss tear 12/15/2010   UGI bleed 12/13/2010   DIARRHEA 04/12/2009   Human immunodeficiency virus (HIV) disease ([place]) 11/15/2008   PANCREATITIS, HX OF 11/15/2008   TOBACCO USE, QUIT 11/15/2008   PAIN IN JOINT, UPPER ARM 08/07/2008   ABDOMINAL WALL HERNIA 04/26/2007   BRONCHITIS, ACUTE 11/16/2006   HYPOKALEMIA, HX OF 07/13/2006   DEPRESSION 03/23/2006   Essential hypertension, malignant 03/23/2006   Gastroesophageal reflux disease 03/23/2006   PANCREATITIS 03/23/2006   MASS, RIGHT AXILLA 03/23/2006   HEADACHE 03/23/2006   SYMPTOM, ENLARGEMENT, LYMPH NODES 03/23/2006   SHINGLES, HX OF 03/23/2006   HYSTERECTOMY, TOTAL, HX OF 03/23/2006    Orientation RESPIRATION BLADDER Height & Weight     Self, Time, Situation, Place  Normal Continent Weight: 100 lb 12 oz (45.7 kg) Height:  5' 2.5" (158.8 cm)  BEHAVIORAL SYMPTOMS/MOOD NEUROLOGICAL BOWEL NUTRITION STATUS      Continent Diet (See DC Summary)  AMBULATORY STATUS COMMUNICATION OF NEEDS Skin   Extensive Assist Verbally Surgical wounds (R arm surgical incision)                       Personal Care Assistance Level of Assistance  Bathing, Feeding, Dressing Bathing Assistance: Maximum assistance Feeding assistance: Limited assistance Dressing Assistance: Maximum assistance     Functional Limitations Info  Sight, Hearing, Speech Sight Info: Adequate Hearing Info: Adequate Speech Info: Adequate    SPECIAL CARE FACTORS  FREQUENCY  PT (By licensed PT), OT (By licensed OT)     PT Frequency: 5x week OT Frequency: 5x week            Contractures Contractures Info: Not present    Additional Factors Info  Code Status, Allergies Code Status Info: Full Allergies Info: Lactose Intolerance (Gi)   Latex   Penicillins           Current Medications (12/29/2020):  This is the  current hospital active medication list Current Facility-Administered Medications  Medication Dose Route Frequency Provider Last Rate Last Admin   acetaminophen (TYLENOL) suppository 650 mg  650 mg Rectal Q6H PRN Baglia, Corrina, PA-C   650 mg at 12/24/20 2100   acetaminophen (TYLENOL) tablet 650 mg  650 mg Oral Q6H PRN Baglia, Corrina, PA-C   650 mg at 12/28/20 8366   calcitRIOL (ROCALTROL) capsule 1.25 mcg  1.25 mcg Oral Q T,Th,Sa-HD Baglia, Corrina, PA-C       Chlorhexidine Gluconate Cloth 2 % PADS 6 each  6 each Topical Q0600 Baglia, Corrina, PA-C   6 each at 12/29/20 0538   cinacalcet (SENSIPAR) tablet 60 mg  60 mg Oral Q T,Th,Sa-HD Baglia, Corrina, PA-C   60 mg at 12/28/20 1109   Darbepoetin Alfa (ARANESP) injection 200 mcg  200 mcg Intravenous Q Thu-HD Baglia, Corrina, PA-C   200 mcg at 12/26/20 1634   dolutegravir (TIVICAY) tablet 50 mg  50 mg Oral q1600 Baglia, Corrina, PA-C   50 mg at 12/28/20 1721   fentaNYL (DURAGESIC) 25 MCG/HR 1 patch  1 patch Transdermal Q72H Baglia, Corrina, PA-C   1 patch at 12/27/20 2157   heparin ADULT infusion 100 units/mL (25000 units/235mL)  650 Units/hr Intravenous Continuous Vernon, Jennifer D, RPH       hydrALAZINE (APRESOLINE) injection 10 mg  10 mg Intravenous Q4H PRN Baglia, Corrina, PA-C       HYDROmorphone (DILAUDID) injection 0.5-1 mg  0.5-1 mg Intravenous Q2H PRN Baglia, Corrina, PA-C   1 mg at 12/29/20 0759   ipratropium-albuterol (DUONEB) 0.5-2.5 (3) MG/3ML nebulizer solution 3 mL  3 mL Nebulization Q4H PRN Baglia, Corrina, PA-C       lamiVUDine (EPIVIR) 10 MG/ML solution 50 mg  50 mg Oral Daily Baglia, Corrina, PA-C   50 mg at 12/29/20 1100   methadone (DOLOPHINE) tablet 2.5 mg  2.5 mg Oral Q2000 Baglia, Corrina, PA-C   2.5 mg at 12/28/20 2026   metoprolol tartrate (LOPRESSOR) injection 5 mg  5 mg Intravenous Q2H PRN Amin, Ankit Chirag, MD   5 mg at 12/29/20 0746   metoprolol tartrate (LOPRESSOR) tablet 50 mg  50 mg Oral BID Amin, Ankit Chirag, MD    50 mg at 12/29/20 0905   mirtazapine (REMERON) tablet 15 mg  15 mg Oral QHS Baglia, Corrina, PA-C   15 mg at 12/28/20 2138   multivitamin (RENA-VIT) tablet 1 tablet  1 tablet Oral QHS Baglia, Corrina, PA-C   1 tablet at 12/28/20 2138   nitroGLYCERIN (NITROSTAT) SL tablet 0.4 mg  0.4 mg Sublingual Q5 Min x 3 PRN Baglia, Corrina, PA-C       ondansetron (ZOFRAN) tablet 4 mg  4 mg Oral Q6H PRN Baglia, Corrina, PA-C       Or   ondansetron (ZOFRAN) injection 4 mg  4 mg Intravenous Q6H PRN Baglia, Corrina, PA-C       oxyCODONE (Oxy IR/ROXICODONE) immediate release tablet 5 mg  5 mg Oral Q4H PRN [name], Jeanella Flattery, MD  5 mg at 12/29/20 0250   pantoprazole (PROTONIX) EC tablet 40 mg  40 mg Oral QAC breakfast Baglia, Corrina, PA-C   40 mg at 12/29/20 1610   senna-docusate (Senokot-S) tablet 2 tablet  2 tablet Oral BID PRN Baglia, Corrina, PA-C       traZODone (DESYREL) tablet 50 mg  50 mg Oral QHS PRN Baglia, Corrina, PA-C   50 mg at 12/28/20 2138   [START ON 12/31/2020] vancomycin (VANCOCIN) IVPB 500 mg/100 ml premix  500 mg Intravenous Q T,Th,Sa-HD Alvira Philips, RPH       venlafaxine XR (EFFEXOR-XR) 24 hr capsule 150 mg  150 mg Oral Q breakfast Baglia, Corrina, PA-C   150 mg at 12/29/20 9604   zidovudine (RETROVIR) capsule 300 mg  300 mg Oral Daily Baglia, Corrina, PA-C   300 mg at 12/29/20 1100   Facility-Administered Medications Ordered in Other Encounters  Medication Dose Route Frequency Provider Last Rate Last Admin   acetaminophen (TYLENOL) tablet 650 mg  650 mg Oral Once Lockamy, Randi L, NP-C       diphenhydrAMINE (BENADRYL) capsule 25 mg  25 mg Oral Once Lockamy, Randi L, NP-C       sodium chloride flush (NS) 0.9 % injection 10 mL  10 mL Intracatheter PRN Lockamy, Randi L, NP-C         Discharge Medications: Please see discharge summary for a list of discharge medications.  Relevant Imaging Results:  Relevant Lab Results:   Additional Information SS# [place]  HD on  TTS  Coralee Pesa, [place]

## 2020-12-29 NOTE — Plan of Care (Signed)
°  Problem: Coping: °Goal: Level of anxiety will decrease °Outcome: Progressing °  °

## 2020-12-29 NOTE — Progress Notes (Signed)
[facility] OF [place] PROGRESS NOTE  ASSESSMENT / PLAN: [name] is a 70 y.o. female status post ligation of right arm AV fistula and incision and drainage of right arm.   PRN pain control Diet as tolerated Dressing changed at bedside. Healthy tissue without active bleeding. Redressed wet-to-dry.  OK to resume heparin drip without bolus.  SUBJECTIVE: Doing well. Dressing change painful. No other complaints.   OBJECTIVE: BP (!) 149/109   Pulse (!) 133   Temp 98.4 F (36.9 C) (Oral)   Resp 20   Ht 5' 2.5" (1.588 m)   Wt 45.7 kg   LMP  (LMP Unknown)   SpO2 96%   BMI 18.13 kg/m   Intake/Output Summary (Last 24 hours) at 12/29/2020 0856 Last data filed at 12/29/2020 0558 Gross per 24 hour  Intake 400 ml  Output 2180 ml  Net -1780 ml    Constitutional: chronically ill appearing. no acute distress. Cardiac: RRR. Pulmonary: unlabored breathing Abdomen: not distended Vascular: RUE wounds healing appropriately without bleeding. LIJ dialysis catheter in place.  CBC Latest Ref Rng & Units 12/28/2020 12/27/2020 12/27/2020  WBC 4.0 - 10.5 K/uL 9.8 - 5.9  Hemoglobin 12.0 - 15.0 g/dL 10.9(L) 8.2(L) 6.6(LL)  Hematocrit 36.0 - 46.0 % 33.0(L) 26.7(L) 21.6(L)  Platelets 150 - 400 K/uL 161 - 164     CMP Latest Ref Rng & Units 12/28/2020 12/27/2020 12/26/2020  Glucose 70 - 99 mg/dL 77 89 72  BUN 8 - 23 mg/dL 40(H) 30(H) 103(H)  Creatinine 0.44 - 1.00 mg/dL 7.35(H) 6.26(H) 13.14(H)  Sodium 135 - 145 mmol/L 137 137 130(L)  Potassium 3.5 - 5.1 mmol/L 4.0 4.0 6.2(H)  Chloride 98 - 111 mmol/L 100 98 93(L)  CO2 22 - 32 mmol/L 25 27 20(L)  Calcium 8.9 - 10.3 mg/dL 9.3 9.4 9.2  Total Protein 6.5 - 8.1 g/dL - - -  Total Bilirubin 0.3 - 1.2 mg/dL - - -  Alkaline Phos 38 - 126 U/L - - -  AST 15 - 41 U/L - - -  ALT 0 - 44 U/L - - -    Estimated Creatinine Clearance: 5.4 mL/min (A) (by C-G formula based on SCr of 7.35 mg/dL (H)).  [name]. Tina Breed,  MD [facility] Phone Number: [phone] 12/29/2020 8:56 AM

## 2020-12-29 NOTE — TOC Initial Note (Signed)
Transition of Care [facility]) - Initial/Assessment Note    Patient Details  Name: [name] MRN: [id number] Date of Birth: 1950-07-03  [facility]) CM/SW Contact:    Coralee Pesa, [place] Phone Number: 12/29/2020, 11:01 AM  Clinical Narrative:                 CSW met with pt at bedside. She confirmed that she is from [facility]. She is unsure of how long she has been there, but it has been a while. She stated she was comfortable returning at discharge. CSW attempted to contact facility, admissions not available today, will need to follow up tomorrow. TOC will continue to follow.  Expected Discharge Plan: Skilled Nursing Facility Barriers to Discharge: Continued Medical Work up   Patient Goals and CMS Choice Patient states their goals for this hospitalization and ongoing recovery are:: To return to [facility] [employer]gov Compare Post Acute Care list provided to:: Patient Choice offered to / list presented to : Patient  Expected Discharge Plan and Services Expected Discharge Plan: [place] Choice: [place] Living arrangements for the past 2 months: [place]                                      Prior Living Arrangements/Services Living arrangements for the past 2 months: [place] Lives with:: Facility Resident Patient language and need for interpreter reviewed:: Yes Do you feel safe going back to the place where you live?: Yes      Need for Family Participation in Patient Care: Yes (Comment) Care giver support system in place?: Yes (comment)   Criminal Activity/Legal Involvement Pertinent to Current Situation/Hospitalization: No - Comment as needed  Activities of Daily Living Home Assistive Devices/Equipment: Wheelchair ADL Screening (condition at time of admission) Patient's cognitive ability adequate to safely complete daily activities?: No Is the patient  deaf or have difficulty hearing?: Yes Does the patient have difficulty seeing, even when wearing glasses/contacts?: No Does the patient have difficulty concentrating, remembering, or making decisions?: No Patient able to express need for assistance with ADLs?: Yes Does the patient have difficulty dressing or bathing?: Yes Independently performs ADLs?: No Communication: Independent Dressing (OT): Needs assistance Does the patient have difficulty walking or climbing stairs?: Yes Weakness of Legs: Both Weakness of Arms/Hands: Both  Permission Sought/Granted Permission sought to share information with : Chartered certified accountant granted to share information with : Yes, Verbal Permission Granted     Permission granted to share info w AGENCY: [place]        Emotional Assessment Appearance:: Appears older than stated age Attitude/Demeanor/Rapport: Engaged Affect (typically observed): Appropriate Orientation: : Oriented to Self, Oriented to Place, Oriented to  Time, Oriented to Situation Alcohol / Substance Use: Not Applicable Psych Involvement: No (comment)  Admission diagnosis:  Pain [R52] ESRD (end stage renal disease) ([place]) [N18.6] Infected fluid collection with fistula [L08.9, Q91.6] Dialysis complication, initial encounter [T82.9XXA] Patient Active Problem List   Diagnosis Date Noted   Hypoglycemia 12/25/2020   Infected fluid collection with fistula 12/24/2020   Dysphagia 12/16/2020   Loss of weight 12/16/2020   Abdominal pain, epigastric 12/16/2020   Protein-calorie malnutrition, severe 11/20/2020   Chest pain of uncertain etiology    Altered mental status    ESRD (end stage renal disease) ([place]) 11/11/2020  Pancreatic abnormality 08/05/2020   Gastritis without bleeding 08/05/2020   Dilated pancreatic duct 05/15/2020   Abnormal MRI of abdomen 05/15/2020   Common bile duct dilation    Lobar pneumonia ([place]) 07/20/2019   Acute respiratory failure  with hypoxia ([place]) 07/20/2019   Colitis 07/19/2019   Dilation of common bile duct 07/19/2019   Ventral hernia without obstruction or gangrene    Hypocalcemia 03/07/2019   Other pancytopenia ([place]) 12/08/2018   Anemia of chronic disease 04/17/2016   Hepatitis B immune 01/28/2016   Atrial flutter ([place]) 02/22/2015   Hyperkalemia 02/20/2015   Venous stenosis of right upper extremity 04/06/2014   ESRD on dialysis ([place]) 04/06/2014   Essential hypertension    Nausea and vomiting 12/15/2012   CHF, chronic ([place]) 11/25/2012   Atrial fibrillation ([place]) 05/02/2012   Intracranial bleed ([place]) 12/28/2011   Angioedema of lips 09/21/2011   Cellulitis of breast 03/09/2011   Erosive esophagitis 12/15/2010   Mallory - Weiss tear 12/15/2010   UGI bleed 12/13/2010   DIARRHEA 04/12/2009   Human immunodeficiency virus (HIV) disease ([place]) 11/15/2008   PANCREATITIS, HX OF 11/15/2008   TOBACCO USE, QUIT 11/15/2008   PAIN IN JOINT, UPPER ARM 08/07/2008   ABDOMINAL WALL HERNIA 04/26/2007   BRONCHITIS, ACUTE 11/16/2006   HYPOKALEMIA, HX OF 07/13/2006   DEPRESSION 03/23/2006   Essential hypertension, malignant 03/23/2006   Gastroesophageal reflux disease 03/23/2006   PANCREATITIS 03/23/2006   MASS, RIGHT AXILLA 03/23/2006   HEADACHE 03/23/2006   SYMPTOM, ENLARGEMENT, LYMPH NODES 03/23/2006   SHINGLES, HX OF 03/23/2006   HYSTERECTOMY, TOTAL, HX OF 03/23/2006   PCP:  Hilbert Corrigan, MD Pharmacy:   [facility], [place] - [address] [place] [address] Phone: [phone] Fax: [phone]     Social Determinants of Health (SDOH) Interventions    Readmission Risk Interventions Readmission Risk Prevention Plan 10/22/2019  Transportation Screening Complete  PCP or Specialist Appt within 3-5 Days Not Complete  Not Complete comments SNF MD to follow  [place] or [place] Not Complete  HRI or Home Care Consult comments Pt resides in a  SNF  Social Work Consult for [place] Planning/Counseling Complete  Palliative Care Screening Not Applicable  Medication Review Press photographer) Complete  Some recent data might be hidden

## 2020-12-29 NOTE — Progress Notes (Signed)
Patient ID: [name], female   DOB: 1950-06-28, 70 y.o.   MRN: [id number] S: A fib with RVR and hypotension during dialysis yesterday requiring IV therapy. Tx ended 30 min early.  Looks like rate control improved with increased oral metoprolol dose.  Needs dialysis catheter converted to tunnelled.    O:BP 130/80 (BP Location: Left Leg)   Pulse 77   Temp 98.5 F (36.9 C) (Oral)   Resp 16   Ht 5' 2.5" (1.588 m)   Wt 45.7 kg   LMP  (LMP Unknown)   SpO2 92%   BMI 18.13 kg/m   Intake/Output Summary (Last 24 hours) at 12/29/2020 1242 Last data filed at 12/29/2020 0558 Gross per 24 hour  Intake 400 ml  Output 0 ml  Net 400 ml    Intake/Output: I/O last 3 completed shifts: In: 740 [P.O.:640; IV Piggyback:100] Out: 2180 [Other:2180]  Intake/Output this shift:  No intake/output data recorded. Weight change: 0.9 kg Gen:NAD-  left temp cath CVS: sounds reg in 70s currently Resp:CTA Abd: +BS,s oft, NT/ND Ext: RUE with dressing from wrist to mid bicep.    Recent Labs  Lab 12/24/20 1518 12/25/20 0409 12/26/20 0508 12/27/20 0958 12/28/20 0735  NA 132* 131* 130* 137 137  K 5.3* 4.9 6.2* 4.0 4.0  CL 92* 91* 93* 98 100  CO2 23 21* 20* 27 25  GLUCOSE 68* 61* 72 89 77  BUN 87* 93* 103* 30* 40*  CREATININE 10.98* 11.47* 13.14* 6.26* 7.35*  ALBUMIN 2.9* 2.6*  --   --  2.3*  CALCIUM 8.9 9.0 9.2 9.4 9.3  PHOS  --   --   --   --  7.6*  AST 12* 10*  --   --   --   ALT 9 7  --   --   --     Liver Function Tests: Recent Labs  Lab 12/24/20 1518 12/25/20 0409 12/28/20 0735  AST 12* 10*  --   ALT 9 7  --   ALKPHOS 88 87  --   BILITOT 0.6 0.9  --   PROT 7.8 7.1  --   ALBUMIN 2.9* 2.6* 2.3*    No results for input(s): LIPASE, AMYLASE in the last 168 hours. No results for input(s): AMMONIA in the last 168 hours. CBC: Recent Labs  Lab 12/24/20 1518 12/25/20 0409 12/26/20 0508 12/27/20 0156 12/27/20 0958 12/28/20 0247 12/29/20 1022  WBC 9.2 8.1 8.8 5.9  --  9.8  10.6*  NEUTROABS 7.6 6.1  --   --   --   --   --   HGB 7.8* 7.3* 7.4* 6.6* 8.2* 10.9* 10.9*  HCT 25.7* 23.9* 24.2* 21.6* 26.7* 33.0* 34.0*  MCV 109.8* 107.7* 103.9* 103.3*  --  96.8 97.7  PLT 205 194 174 164  --  161 174    Cardiac Enzymes: No results for input(s): CKTOTAL, CKMB, CKMBINDEX, TROPONINI in the last 168 hours. CBG: Recent Labs  Lab 12/28/20 1053 12/28/20 1619 12/28/20 2033 12/29/20 0426 12/29/20 0645  GLUCAP 86 101* 84 70 83     Iron Studies: No results for input(s): IRON, TIBC, TRANSFERRIN, FERRITIN in the last 72 hours. Studies/Results: No results found.  calcitRIOL  1.25 mcg Oral Q T,Th,Sa-HD   Chlorhexidine Gluconate Cloth  6 each Topical Q0600   cinacalcet  60 mg Oral Q T,Th,Sa-HD   darbepoetin (ARANESP) injection - DIALYSIS  200 mcg Intravenous Q Thu-HD   dolutegravir  50 mg Oral q1600   fentaNYL  1 patch Transdermal Q72H   lamiVUDine  50 mg Oral Daily   methadone  2.5 mg Oral Q2000   metoprolol tartrate  50 mg Oral BID   mirtazapine  15 mg Oral QHS   multivitamin  1 tablet Oral QHS   pantoprazole  40 mg Oral QAC breakfast   [START ON 12/31/2020] vancomycin  500 mg Intravenous Q T,Th,Sa-HD   venlafaxine XR  150 mg Oral Q breakfast   zidovudine  300 mg Oral Daily    BMET    Component Value Date/Time   NA 137 12/28/2020 0735   K 4.0 12/28/2020 0735   CL 100 12/28/2020 0735   CO2 25 12/28/2020 0735   GLUCOSE 77 12/28/2020 0735   BUN 40 (H) 12/28/2020 0735   CREATININE 7.35 (H) 12/28/2020 0735   CREATININE 5.66 (H) 12/22/2019 1117   CALCIUM 9.3 12/28/2020 0735   CALCIUM 9.0 03/12/2011 0527   GFRNONAA 6 (L) 12/28/2020 0735   GFRNONAA 5 (L) 02/12/2015 1000   GFRAA 9 (L) 10/22/2019 0105   GFRAA 5 (L) 02/12/2015 1000   CBC    Component Value Date/Time   WBC 10.6 (H) 12/29/2020 1022   RBC 3.48 (L) 12/29/2020 1022   HGB 10.9 (L) 12/29/2020 1022   HCT 34.0 (L) 12/29/2020 1022   PLT 174 12/29/2020 1022   MCV 97.7 12/29/2020 1022   MCH 31.3  12/29/2020 1022   MCHC 32.1 12/29/2020 1022   RDW 19.9 (H) 12/29/2020 1022   LYMPHSABS 1.1 12/25/2020 0409   MONOABS 0.7 12/25/2020 0409   EOSABS 0.1 12/25/2020 0409   BASOSABS 0.0 12/25/2020 0409    Dialysis Orders: Center: [facility]  on TTS . EDW 41.5kg HD Bath 2K/2.5Ca  Time 3 hours Heparin 1000 units bolus then 500 units/hr. 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  appreciate vascular surgery evaluation-  s/p ligation and debridement 10/28.  On vanc. MRSA on culture  ESRD -  Last HD 10/29 Sat, will be due next Tues (may be outpt). Will need temp HD catheter changed prior to d/c. Requesting IR complete Monday.   Hypertension/volume  - stable- no BP meds-  Uf as able-  has prespecified max of 3.5 liters  Anemia  - Hb 10s. continue with ESA.    Metabolic bone disease -  continue with calcitriol and sensipar-  I dont see a binder on her med list  Nutrition - renal diet. Hypoglycemia - PRN D50  AFib/FL:  improved with increased metoprolol dose HIV on meds   [name]  [name] [phone]

## 2020-12-29 NOTE — Consult Note (Signed)
Chief Complaint: Patient was seen in consultation today for tunneled dialysis catheter placement - conversion from temp placed 10/27 Chief Complaint  Patient presents with   Post-op Problem   at the request of Dr Leanora Cover   Supervising Physician: Juliet Rude  Patient Status: [facility] - In-pt  History of Present Illness: [name] is a 70 y.o. 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 difficile colitis 10/10/11   Chronic diarrhea    Chronic pain    Depression    ESRD on hemodialysis [facility])    Dialysis [name]   GERD (gastroesophageal reflux disease)    HIV (human immunodeficiency virus infection) ([place])    Hypertension    Insomnia    Intracranial hemorrhage (HCC)    Muscle weakness (generalized)    Pulmonary HTN ([place])    Tachycardia    TIA (transient ischemic attack)    [facility] do not have this dx   Traumatic hematoma of right upper arm 02/22/2015    Past Surgical History:  Procedure Laterality Date   A/V FISTULAGRAM N/A 04/17/2016   Procedure: A/V Fistulagram - Right Upper;  Surgeon: Waynetta Sandy, MD;  Location: [place] CV LAB;  Service: Cardiovascular;  Laterality: N/A;   A/V FISTULAGRAM Right 08/28/2019   Procedure: A/V FISTULAGRAM;  Surgeon: Waynetta Sandy, MD;  Location: [place] [facility];  Service: Cardiovascular;  Laterality: Right;   ABDOMINAL HYSTERECTOMY     BIOPSY  01/02/2020   Procedure: BIOPSY;  Surgeon: Eloise Harman, DO;  [facility]: [facility];  Service: Endoscopy;;   BIOPSY  07/11/2020   Procedure: BIOPSY;  Surgeon: Irving Copas., MD;  Location: [place];  Service: Gastroenterology;;   BIOPSY THYROID     CARPAL TUNNEL RELEASE Right 11/16/2019   Procedure: CARPAL TUNNEL RELEASE;  Surgeon: Leanora Cover, MD;  Location: [place];  Service: Orthopedics;  Laterality: Right;   COLONOSCOPY WITH PROPOFOL N/A 01/02/2020   Surgeon: Eloise Harman, DO;nonbleeding internal hemorrhoids, diverticulosis, status post biopsies of the entire colon (benign). Due for repeat in 2026.   Dialysis Shunts     previous one removed from left arm and now present one in right arm   DIALYSIS/PERMA CATHETER INSERTION Right 04/17/2016   Procedure: dialysis Catheter Insertion central veinous;  Surgeon: Waynetta Sandy, MD;  Location: [place] CV LAB;  Service: Cardiovascular;  Laterality: Right;   ESOPHAGOGASTRODUODENOSCOPY  12/15/2010   Rourk: erosive reflux esophagitis, MW tear, hiatal hernia (small), gastritis with no h.pylori, possibly nsaid related   ESOPHAGOGASTRODUODENOSCOPY (EGD) WITH PROPOFOL N/A 07/11/2020    Surgeon: Irving Copas., MD; normal esophagus, 4 cm hiatal hernia, gastritis biopsied (mild chronic gastritis, negative H. pylori), single duodenal polyp (Brunner's gland hyperplasia), s/p duodenal biopsy benign, duodenal narrowing at the duodenal sweep.   EUS N/A 07/11/2020    Surgeon: Irving Copas., MD;  dilated CBD and common hepatic duct without stones or sludge in the bile duct, stones and biliary sludge in the gallbladder, pancreatic parenchyma with hyperechoic strands, pancreatic duct dilated with prominent sidebranches in the neck and body  and tail, no pancreatic mass though visulalization limited, no ampullary lesion, no malignant appearing lymph node   EXCISION OF BREAST BIOPSY Right 05/04/2012   Procedure: EXCISION OF BREAST BIOPSY;  Surgeon: Donato Heinz, MD;  Location: [facility];  Service: General;  Laterality: Right;  Right Excisional Breast Biopsy   FISTULOGRAM Right 03/26/2014    Procedure: Right Arm Fistulogram with Venoplasty Right Subclavian Vein and Inominate Vein. Debridement Fistula Ulcer;  Surgeon: Conrad Fort Yates, MD;  Location: [place];  Service: Vascular;  Laterality: Right;   FISTULOGRAM Right 03/29/2017   Procedure: FISTULOGRAM COMPLEX RIGHT ARM with Balloon angioplasty;  Surgeon: Waynetta Sandy, MD;  Location: [place];  Service: Vascular;  Laterality: Right;   HEMOSTASIS CLIP PLACEMENT  07/11/2020   Procedure: HEMOSTASIS CLIP PLACEMENT;  Surgeon: Irving Copas., MD;  Location: [place];  Service: Gastroenterology;;   INCISION AND DRAINAGE Right 12/27/2020   Procedure: INCISION AND DRAINAGE OF RIGHT FOREARM;  Surgeon: Waynetta Sandy, MD;  Location: [place];  Service: Vascular;  Laterality: Right;   IR FLUORO GUIDE CV LINE LEFT  12/26/2020   IR GENERIC HISTORICAL  05/19/2016   IR REMOVAL TUN CV CATH W/O FL 05/19/2016 Markus Daft, MD [facility]-INTERV RAD   IR US GUIDE VASC ACCESS LEFT  12/26/2020   LIGATION OF ARTERIOVENOUS  FISTULA Right 11/12/2020   Procedure: LIGATION OF ARTERIOVENOUS  FISTULA WITH EXCISION OF NECROTIC TISSUE;  Surgeon: Waynetta Sandy, MD;  Location: [place];  Service: Vascular;  Laterality: Right;   LIGATION OF ARTERIOVENOUS  FISTULA Right 12/27/2020   Procedure: LIGATION OF ARTERIOVENOUS  FISTULA;  Surgeon: Waynetta Sandy, MD;  Location: [place];  Service: Vascular;  Laterality: Right;   PERIPHERAL VASCULAR BALLOON ANGIOPLASTY Right 04/17/2016   Procedure: Peripheral Vascular Balloon Angioplasty;  Surgeon: Waynetta Sandy, MD;  Location: [place] CV LAB;  Service: Cardiovascular;  Laterality: Right;  Central segment AV Fistula   PERIPHERAL VASCULAR BALLOON ANGIOPLASTY Right 03/14/2018   Procedure: PERIPHERAL VASCULAR BALLOON ANGIOPLASTY;  Surgeon: Waynetta Sandy, MD;  Location: [place] CV LAB;  Service: Cardiovascular;  Laterality: Right;  subclavian vein   PERIPHERAL VASCULAR  BALLOON ANGIOPLASTY Right 08/28/2019   Procedure: PERIPHERAL VASCULAR BALLOON ANGIOPLASTY;  Surgeon: Waynetta Sandy, MD;  Location: [place] CV LAB;  Service: Cardiovascular;  Laterality: Right;  upper arm   PERIPHERAL VASCULAR BALLOON ANGIOPLASTY Right 11/11/2020   Procedure: PERIPHERAL VASCULAR BALLOON ANGIOPLASTY;  Surgeon: Waynetta Sandy, MD;  Location: [place] CV LAB;  Service: Cardiovascular;  Laterality: Right;   PERIPHERAL VASCULAR INTERVENTION Right 03/14/2018   Procedure: PERIPHERAL VASCULAR INTERVENTION;  Surgeon: Waynetta Sandy, MD;  Location: [place] CV LAB;  Service: Cardiovascular;  Laterality: Right;  subclavian vein   POLYPECTOMY  07/11/2020   Procedure: POLYPECTOMY;  Surgeon: Mansouraty, Telford Nab., MD;  Location: [facility] ENDOSCOPY;  Service: Gastroenterology;;   REVISON OF ARTERIOVENOUS FISTULA Right 11/12/2020   Procedure: CREATION OF RIGHT ARM ARTERIOVENOUS FISTULA;  Surgeon: Waynetta Sandy, MD;  Location: [place];  Service: Vascular;  Laterality: Right;   SHUNTOGRAM Right 10/19/2013   Procedure: FISTULOGRAM;  Surgeon: Conrad [place], MD;  Location: [facility] C[facility] LAB;  Service: Cardiovascular;  Laterality: Right;   TONSILLECTOMY     VISCERAL VENOGRAPHY Right 03/14/2018   Procedure: CENTRAL VENOGRAPHY;  Surgeon: Waynetta Sandy, MD;  Location: [place] [facility];  Service: Cardiovascular;  Laterality: Right;  upper ext fistula    Allergies: Lactose intolerance (gi), Latex, and Penicillins  Medications: Prior to Admission medications   Medication Sig Start Date End Date Taking? Authorizing Provider  apixaban (ELIQUIS) 2.5 MG TABS tablet Take 1 tablet (2.5 mg total) by mouth 2 (two) times daily. 11/27/20  Yes Ulyses Amor, PA-C  baclofen (LIORESAL) 10 MG tablet Take 10 mg by mouth daily as needed for muscle spasms.   Yes [provider]  clopidogrel (PLAVIX) 75 MG tablet Take 1 tablet (75 mg total) by mouth daily  with breakfast. 07/14/20  Yes Mansouraty, Telford Nab., MD  collagenase (SANTYL) ointment Apply 1 application topically daily. Apply to sacrum topically every day shift for wound healing   Yes [provider]  dolutegravir (TIVICAY) 50 MG tablet Take 50 mg by mouth daily at 4 PM.   Yes [provider]  Emollient (MINERIN) LOTN Apply 1 application topically every 12 (twelve) hours as needed (dry skin). Apply to feet and ankles   Yes [provider]  fentaNYL (DURAGESIC) 25 MCG/HR Place 1 patch onto the skin every 3 (three) days. 11/27/20  Yes Laurence Slate M, PA-C  lamivudine (EPIVIR) 100 MG tablet Take 50 mg by mouth daily. (0800)   Yes [provider]  lidocaine-prilocaine (EMLA) cream Apply 1 application topically See admin instructions. (0900) Apply to fistula site one hour before dialysis on Tuesday, Thursday, and Saturday.   Yes [provider]  melatonin 3 MG TABS tablet Take 3 mg by mouth at bedtime. (2000)   Yes [provider]  methadone (DOLOPHINE) 5 MG tablet Take 0.5 tablets (2.5 mg total) by mouth daily at 8 pm. (2000) 11/27/20  Yes Ulyses Amor, PA-C  metoprolol tartrate (LOPRESSOR) 25 MG tablet Take 1 tablet (25 mg total) by mouth 2 (two) times daily. 11/27/20  Yes Ulyses Amor, PA-C  mirtazapine (REMERON) 15 MG tablet Take 15 mg by mouth at bedtime. 12/05/20  Yes [provider]  Multiple Vitamin (MULTIVITAMIN WITH MINERALS) TABS tablet Take 1 tablet by mouth at bedtime. (2000)   Yes [provider]  nitroGLYCERIN (NITROSTAT) 0.4 MG SL tablet Place 0.4 mg under the tongue every 5 (five) minutes x 3 doses as needed for chest pain.    Yes [provider]  ondansetron (ZOFRAN ODT) 8 MG disintegrating tablet Take 1 tablet (8 mg total) by mouth every 8 (eight) hours as needed for nausea or vomiting. 12/16/20  Yes Erenest Rasher, PA-C  Oxycodone HCl 10 MG TABS Take 1 tablet (10 mg total) by mouth every 12  (twelve) hours as needed (severe pain). 11/27/20  Yes Laurence Slate M, PA-C  pantoprazole (PROTONIX) 40 MG tablet Take 1 tablet (40 mg total) by mouth daily before breakfast. 12/16/20  Yes Erenest Rasher, PA-C  promethazine (PHENERGAN) 12.5 MG tablet Take 12.5 mg by mouth See admin instructions. Take 1 tablet (12.5 mg) by mouth in the morning every Tuesday, Thursday and Saturday for nausea/vomiting and every 6 hours as needed for n/v   Yes [provider]  senna (SENOKOT) 8.6 MG tablet Take 2 tablets by mouth every evening. (2100)   Yes [provider]  venlafaxine XR (EFFEXOR-XR) 150 MG 24 hr capsule Take 150 mg by mouth daily with breakfast. (0900)   Yes [provider]  vitamin C (ASCORBIC ACID) 500 MG tablet Take 500 mg by mouth 2 (two) times daily.   Yes [provider]  zidovudine (RETROVIR) 100 MG capsule Take 100 mg by mouth 3 (three) times daily. (0900, 1300, & 2100)  Yes [provider]  Zinc Sulfate (ZINC-220 PO) Take 1 tablet by mouth daily.   Yes [provider]  Amino Acids-Protein Hydrolys (PRO-STAT 64 PO) Take by mouth. Give 89m by mouth in the morning for wound healing    [provider]  cyanocobalamin (,VITAMIN B-12,) 1000 MCG/ML injection Inject 1,000 mcg into the muscle every 30 (thirty) days. On the 28th of every month    [provider]  Lidocaine HCl 4 % CREA Apply 1 application topically every 12 (twelve) hours as needed (pain).    [provider]  mirtazapine (REMERON) 7.5 MG tablet Take 7.5 mg by mouth at bedtime. (2100) Patient not taking: Reported on 12/24/2020    [provider]  ondansetron (ZOFRAN) 8 MG tablet Take 8 mg by mouth 3 (three) times daily. Patient not taking: Reported on 12/24/2020 12/16/20   [provider]     Family History  Problem Relation Age of Onset   Anesthesia problems Neg Hx    Hypotension Neg Hx    Malignant hyperthermia Neg Hx     Pseudochol deficiency Neg Hx    Colon cancer Neg Hx        Unsure of parents who both died when she was a baby    Social History   Socioeconomic History   Marital status: Widowed    Spouse name: Not on file   Number of children: 1   Years of education: Not on file   Highest education level: Not on file  Occupational History   Not on file  Tobacco Use   Smoking status: Former    Types: Cigarettes    Quit date: 03/12/1983    Years since quitting: 37.8   Smokeless tobacco: Never  Vaping Use   Vaping Use: Never used  Substance and Sexual Activity   Alcohol use: No    Alcohol/week: 0.0 standard drinks   Drug use: No   Sexual activity: Not Currently    Birth control/protection: Surgical    Comment: Hysterectomy  Other Topics Concern   Not on file  Social History Narrative   Not on file   Social Determinants of Health   Financial Resource Strain: Not on file  Food Insecurity: Not on file  Transportation Needs: Not on file  Physical Activity: Not on file  Stress: Not on file  Social Connections: Not on file    Review of Systems: A 12 point ROS discussed and pertinent positives are indicated in the HPI above.  All other systems are negative.  Vital Signs: BP 130/80 (BP Location: Left Leg)   Pulse 77   Temp 98.5 F (36.9 C) (Oral)   Resp 16   Ht 5' 2.5" (1.588 m)   Wt 100 lb 12 oz (45.7 kg)   LMP  (LMP Unknown)   SpO2 92%   BMI 18.13 kg/m   Physical Exam Vitals reviewed.  Cardiovascular:     Rate and Rhythm: Normal rate.     Heart sounds: Normal heart sounds.  Pulmonary:     Breath sounds: Normal breath sounds.  Musculoskeletal:     Comments: Rt arm bandaged from 10/28 fistula surgery  Skin:    General: Skin is warm.  Neurological:     Mental Status: She is oriented to person, place, and time.  Psychiatric:        Behavior: Behavior normal.     Comments: Pt is A/O-- can state dob; place and time Weak from illness Right arm in bandages from  surgery on  fistula    Imaging: IR Fluoro Guide CV Line Left  Result Date: 12/26/2020 INDICATION: 70 year old female with history of end-stage renal disease in malfunctioning right upper extremity arteriovenous fistula in the setting of right innominate vein stenosis/stent occlusion. Alternate hemodialysis access is required pending central venous intervention. EXAM: NON-TUNNELED CENTRAL VENOUS HEMODIALYSIS CATHETER PLACEMENT WITH ULTRASOUND AND FLUOROSCOPIC GUIDANCE COMPARISON:  None. MEDICATIONS: None FLUOROSCOPY TIME:  0 minutes, 18 seconds (2 mGy) COMPLICATIONS: None immediate. PROCEDURE: Informed written consent was obtained from the patient after a discussion of the risks, benefits, and alternatives to treatment. Questions regarding the procedure were encouraged and answered. The left neck and chest were prepped with chlorhexidine in a sterile fashion, and a sterile drape was applied covering the operative field. Maximum barrier sterile technique with sterile gowns and gloves were used for the procedure. A timeout was performed prior to the initiation of the procedure. After the overlying soft tissues were anesthetized, a small venotomy incision was created and a micropuncture kit was utilized to access the internal jugular vein. Real-time ultrasound guidance was utilized for vascular access including the acquisition of a permanent ultrasound image documenting patency of the accessed vessel. The microwire was utilized to measure appropriate catheter length. A Rosen wire was advanced to the level of the IVC. Under fluoroscopic guidance, the venotomy was serially dilated, ultimately allowing placement of a 20 cm temporary Trialysis catheter with tip ultimately terminating within the superior aspect of the right atrium. Final catheter positioning was confirmed and documented with a spot radiographic image. The catheter aspirates and flushes normally. The catheter was flushed with appropriate volume heparin dwells. The  catheter exit site was secured with a 0-Silk retention suture. A dressing was placed. The patient tolerated the procedure well without immediate post procedural complication. IMPRESSION: Successful placement of a left internal jugular approach 20 cm temporary dialysis catheter with tip terminating with in the superior aspect of the right atrium. The catheter is ready for immediate use. PLAN: This catheter may be converted to a tunneled dialysis catheter at a later date as indicated. Ruthann Cancer, MD [facility] Radiology Electronically Signed   By: Ruthann Cancer M.D.   On: 12/26/2020 12:45   IR US Guide Vasc Access Left  Result Date: 12/26/2020 INDICATION: 70 year old female with history of end-stage renal disease in malfunctioning right upper extremity arteriovenous fistula in the setting of right innominate vein stenosis/stent occlusion. Alternate hemodialysis access is required pending central venous intervention. EXAM: NON-TUNNELED CENTRAL VENOUS HEMODIALYSIS CATHETER PLACEMENT WITH ULTRASOUND AND FLUOROSCOPIC GUIDANCE COMPARISON:  None. MEDICATIONS: None FLUOROSCOPY TIME:  0 minutes, 18 seconds (2 mGy) COMPLICATIONS: None immediate. PROCEDURE: Informed written consent was obtained from the patient after a discussion of the risks, benefits, and alternatives to treatment. Questions regarding the procedure were encouraged and answered. The left neck and chest were prepped with chlorhexidine in a sterile fashion, and a sterile drape was applied covering the operative field. Maximum barrier sterile technique with sterile gowns and gloves were used for the procedure. A timeout was performed prior to the initiation of the procedure. After the overlying soft tissues were anesthetized, a small venotomy incision was created and a micropuncture kit was utilized to access the internal jugular vein. Real-time ultrasound guidance was utilized for vascular access including  the acquisition of a permanent ultrasound image documenting patency of the accessed vessel. The microwire was utilized to measure appropriate catheter length. A Rosen wire was advanced to the level of the  IVC. Under fluoroscopic guidance, the venotomy was serially dilated, ultimately allowing placement of a 20 cm temporary Trialysis catheter with tip ultimately terminating within the superior aspect of the right atrium. Final catheter positioning was confirmed and documented with a spot radiographic image. The catheter aspirates and flushes normally. The catheter was flushed with appropriate volume heparin dwells. The catheter exit site was secured with a 0-Silk retention suture. A dressing was placed. The patient tolerated the procedure well without immediate post procedural complication. IMPRESSION: Successful placement of a left internal jugular approach 20 cm temporary dialysis catheter with tip terminating with in the superior aspect of the right atrium. The catheter is ready for immediate use. PLAN: This catheter may be converted to a tunneled dialysis catheter at a later date as indicated. Ruthann Cancer, MD [facility] Radiology Electronically Signed   By: Ruthann Cancer M.D.   On: 12/26/2020 12:45   DG Chest Port 1 View  Result Date: 12/24/2020 CLINICAL DATA:  Recent shunt placement, pain EXAM: PORTABLE CHEST 1 VIEW COMPARISON:  04/06/2020 FINDINGS: Gross cardiomegaly and pulmonary vascular prominence. No acute appearing airspace opacity. Right brachiocephalic venous stent. Surgical clips in the right axilla. IMPRESSION: Gross cardiomegaly and pulmonary vascular prominence. No overt edema or acute appearing airspace opacity. Electronically Signed   By: Delanna Ahmadi M.D.   On: 12/24/2020 16:11    Labs:  CBC: Recent Labs    12/26/20 0508 12/27/20 0156 12/27/20 0958 12/28/20 0247 12/29/20 1022  WBC 8.8 5.9  --  9.8 10.6*  HGB 7.4* 6.6* 8.2* 10.9*  10.9*  HCT 24.2* 21.6* 26.7* 33.0* 34.0*  PLT 174 164  --  161 174    COAGS: Recent Labs    12/29/20 1022  APTT 35    BMP: Recent Labs    12/25/20 0409 12/26/20 0508 12/27/20 0958 12/28/20 0735  NA 131* 130* 137 137  K 4.9 6.2* 4.0 4.0  CL 91* 93* 98 100  CO2 21* 20* 27 25  GLUCOSE 61* 72 89 77  BUN 93* 103* 30* 40*  CALCIUM 9.0 9.2 9.4 9.3  CREATININE 11.47* 13.14* 6.26* 7.35*  GFRNONAA 3* 3* 7* 6*    LIVER FUNCTION TESTS: Recent Labs    07/11/20 0835 11/14/20 1000 11/21/20 0935 11/21/20 1330 11/26/20 0732 12/24/20 1518 12/25/20 0409 12/28/20 0735  BILITOT 0.8  --  0.6  --   --  0.6 0.9  --   AST 27  --  20  --   --  12* 10*  --   ALT 6  --  11  --   --  9 7  --   ALKPHOS 82  --  83  --   --  88 87  --   PROT 7.9  --  8.5*  --   --  7.8 7.1  --   ALBUMIN 3.5   < > 3.2*   < > 3.1* 2.9* 2.6* 2.3*   < > = values in this interval not displayed.    TUMOR MARKERS: No results for input(s): AFPTM, CEA, CA199, CHROMGRNA in the last 8760 hours.  Assessment and Plan:  ESRD Temp cath placed in IR 10/27 Rt arm fistula revision secondary malfunction and infection Now needs tunneled catheter for OP use---- for DC soon Risks and benefits discussed with the patient including, but not limited to bleeding, infection, vascular injury, pneumothorax which may require chest tube placement, air embolism or even death  All of the patient's questions  were answered, patient is agreeable to proceed. Consent signed and in chart.   Thank you for this interesting consult.  I greatly enjoyed meeting [name] and look forward to participating in their care.  A copy of this report was sent to the requesting provider on this date.  Electronically Signed: Lavonia Drafts, PA-C 12/29/2020, 1:28 PM   I spent a total of 20 Minutes    in face to face in clinical consultation, greater than 50% of which was counseling/coordinating care for tunneled HD catheter

## 2020-12-29 NOTE — Progress Notes (Signed)
ANTICOAGULATION CONSULT NOTE - Initial Consult  Pharmacy Consult for heparin Indication: atrial fibrillation  Allergies  Allergen Reactions   Lactose Intolerance (Gi) Other (See Comments)    On MAR   Latex Rash and Itching   Penicillins Rash and Swelling    Has patient had a PCN reaction causing immediate rash, facial/tongue/throat swelling, SOB or lightheadedness with hypotension: No Has patient had a PCN reaction causing severe rash involving mucus membranes or skin necrosis: No Has patient had a PCN reaction that required hospitalization No Has patient had a PCN reaction occurring within the last 10 years: No If all of the above answers are "NO", then may proceed with Cephalosporin use.      Patient Measurements: Height: 5' 2.5" (158.8 cm) Weight: 45.7 kg (100 lb 12 oz) IBW/kg (Calculated) : 51.25 Heparin Dosing Weight: TBW  Vital Signs: Temp: 98.5 F (36.9 C) (10/30 0950) Temp Source: Oral (10/30 0950) BP: 130/80 (10/30 0950) Pulse Rate: 77 (10/30 0950)  Labs: Recent Labs    12/27/20 0156 12/27/20 0958 12/28/20 0247 12/28/20 0735  HGB 6.6* 8.2* 10.9*  --   HCT 21.6* 26.7* 33.0*  --   PLT 164  --  161  --   CREATININE  --  6.26*  --  7.35*    Estimated Creatinine Clearance: 5.4 mL/min (A) (by C-G formula based on SCr of 7.35 mg/dL (H)).   Medical History: Past Medical History:  Diagnosis Date   Anemia    Anxiety    Atrial flutter ([place]) 02/22/2015   C. difficile colitis 10/10/11   Chronic diarrhea    Chronic pain    Depression    ESRD on hemodialysis [facility])    Dialysis [name]   GERD (gastroesophageal reflux disease)    HIV (human immunodeficiency virus infection) ([place])    Hypertension    Insomnia    Intracranial hemorrhage (HCC)    Muscle weakness (generalized)    Pulmonary HTN (HCC)    Tachycardia    TIA (transient ischemic attack)    [facility] do not have this dx   Traumatic hematoma of right upper arm 02/22/2015     Assessment: 63 yof admitted with an infected RUE fistula. Apixaban from PTA for Aflutter held on admit, last dose 10/24. She is s/p ligation right arm AV fistula and I&D right arm wound on 10/28. She had Afib with RVR in HD yesterday. Pharmacy consulted to begin heparin drip with no bolus; will also attempt to keep goal range on low end due to low Hgb needing transfusion on 10/28.  No bleeding noted, Hgb improved to 10.9 s/p transfusion, platelets are normal.  Goal of Therapy:  Heparin level 0.3-0.5 units/ml Monitor platelets by anticoagulation protocol: Yes   Plan:  Baseline aPTT and heparin level to see if apixaban still affecting heparin level Begin heparin drip at 650 units/hr with no bolus 8h heparin level Daily heparin level, CBC Monitor for s/sx of bleeding  Thank you for involving pharmacy in this patient's care.  Renold Genta, PharmD, BCPS Clinical Pharmacist Clinical phone for 12/29/2020 until 3p is [phone] 12/29/2020 10:05 AM  **Pharmacist phone directory can be found on [email or web address] listed under [place]**

## 2020-12-29 NOTE — Progress Notes (Signed)
ANTICOAGULATION CONSULT NOTE - Initial Consult  Pharmacy Consult for heparin Indication: atrial fibrillation  Allergies  Allergen Reactions   Lactose Intolerance (Gi) Other (See Comments)    On MAR   Latex Rash and Itching   Penicillins Rash and Swelling    Has patient had a PCN reaction causing immediate rash, facial/tongue/throat swelling, SOB or lightheadedness with hypotension: No Has patient had a PCN reaction causing severe rash involving mucus membranes or skin necrosis: No Has patient had a PCN reaction that required hospitalization No Has patient had a PCN reaction occurring within the last 10 years: No If all of the above answers are "NO", then may proceed with Cephalosporin use.      Patient Measurements: Height: 5' 2.5" (158.8 cm) Weight: 45.7 kg (100 lb 12 oz) IBW/kg (Calculated) : 51.25 Heparin Dosing Weight: TBW  Vital Signs: Temp: 99.1 F (37.3 C) (10/30 2113) Temp Source: Oral (10/30 2113) BP: 145/78 (10/30 2113) Pulse Rate: 80 (10/30 2113)  Labs: Recent Labs    12/27/20 0156 12/27/20 0958 12/28/20 0247 12/28/20 0735 12/29/20 1022 12/29/20 2044  HGB 6.6* 8.2* 10.9*  --  10.9*  --   HCT 21.6* 26.7* 33.0*  --  34.0*  --   PLT 164  --  161  --  174  --   APTT  --   --   --   --  35  --   HEPARINUNFRC  --   --   --   --  0.11* 0.20*  CREATININE  --  6.26*  --  7.35*  --   --     Estimated Creatinine Clearance: 5.4 mL/min (A) (by C-G formula based on SCr of 7.35 mg/dL (H)).   Medical History: Past Medical History:  Diagnosis Date   Anemia    Anxiety    Atrial flutter ([place]) 02/22/2015   C. difficile colitis 10/10/11   Chronic diarrhea    Chronic pain    Depression    ESRD on hemodialysis [facility])    Dialysis [name]   GERD (gastroesophageal reflux disease)    HIV (human immunodeficiency virus infection) ([place])    Hypertension    Insomnia    Intracranial hemorrhage ([facility])    Muscle weakness (generalized)    Pulmonary HTN ([facility])     Tachycardia    TIA (transient ischemic attack)    [facility] do not have this dx   Traumatic hematoma of right upper arm 02/22/2015    Assessment: 75 yof admitted with an infected RUE fistula. Apixaban from PTA for Aflutter held on admit, last dose 10/24. She is s/p ligation right arm AV fistula and I&D right arm wound on 10/28. She had Afib with RVR in HD 10/29. Pharmacy consulted to begin heparin drip with no bolus; will also attempt to keep goal range on low end due to low Hgb needing transfusion on 10/28.   Heparin level 0.2 is subtherapeutic on 650 units/hr. Hgb stable since transfusion.   Goal of Therapy:  Heparin level 0.3-0.5 units/ml Monitor platelets by anticoagulation protocol: Yes   Plan:   Increase heparin infusion to 750 units/hr  Daily heparin level, CBC Monitor for s/sx of bleeding  Thank you for involving pharmacy in this patient's care.  Benetta Spar, PharmD, BCPS, BCCP Clinical Pharmacist  Please check AMION for all [place] phone numbers After 10:00 PM, call [place] [phone]

## 2020-12-30 ENCOUNTER — Inpatient Hospital Stay (HOSPITAL_COMMUNITY): Payer: Medicare Other

## 2020-12-30 DIAGNOSIS — N186 End stage renal disease: Secondary | ICD-10-CM | POA: Diagnosis not present

## 2020-12-30 DIAGNOSIS — L089 Local infection of the skin and subcutaneous tissue, unspecified: Secondary | ICD-10-CM | POA: Diagnosis not present

## 2020-12-30 DIAGNOSIS — B20 Human immunodeficiency virus [HIV] disease: Secondary | ICD-10-CM | POA: Diagnosis not present

## 2020-12-30 DIAGNOSIS — Z992 Dependence on renal dialysis: Secondary | ICD-10-CM | POA: Diagnosis not present

## 2020-12-30 DIAGNOSIS — I10 Essential (primary) hypertension: Secondary | ICD-10-CM | POA: Diagnosis not present

## 2020-12-30 HISTORY — PX: IR FLUORO GUIDE CV LINE LEFT: IMG2282

## 2020-12-30 HISTORY — PX: IR PERC TUN PERIT CATH WO PORT S&I /IMAG: IMG2327

## 2020-12-30 HISTORY — PX: IR US GUIDE VASC ACCESS LEFT: IMG2389

## 2020-12-30 LAB — CBC
HCT: 32.1 % — ABNORMAL LOW (ref 36.0–46.0)
Hemoglobin: 10 g/dL — ABNORMAL LOW (ref 12.0–15.0)
MCH: 31 pg (ref 26.0–34.0)
MCHC: 31.2 g/dL (ref 30.0–36.0)
MCV: 99.4 fL (ref 80.0–100.0)
Platelets: 147 10*3/uL — ABNORMAL LOW (ref 150–400)
RBC: 3.23 MIL/uL — ABNORMAL LOW (ref 3.87–5.11)
RDW: 19.3 % — ABNORMAL HIGH (ref 11.5–15.5)
WBC: 6.5 10*3/uL (ref 4.0–10.5)
nRBC: 0 % (ref 0.0–0.2)

## 2020-12-30 LAB — BASIC METABOLIC PANEL
Anion gap: 10 (ref 5–15)
BUN: 33 mg/dL — ABNORMAL HIGH (ref 8–23)
CO2: 24 mmol/L (ref 22–32)
Calcium: 8.2 mg/dL — ABNORMAL LOW (ref 8.9–10.3)
Chloride: 102 mmol/L (ref 98–111)
Creatinine, Ser: 6.86 mg/dL — ABNORMAL HIGH (ref 0.44–1.00)
GFR, Estimated: 6 mL/min — ABNORMAL LOW (ref >60)
Glucose, Bld: 71 mg/dL (ref 70–99)
Potassium: 3.4 mmol/L — ABNORMAL LOW (ref 3.5–5.1)
Sodium: 136 mmol/L (ref 135–145)

## 2020-12-30 LAB — GLUCOSE, CAPILLARY
Glucose-Capillary: 117 mg/dL — ABNORMAL HIGH (ref 70–99)
Glucose-Capillary: 119 mg/dL — ABNORMAL HIGH (ref 70–99)
Glucose-Capillary: 69 mg/dL — ABNORMAL LOW (ref 70–99)
Glucose-Capillary: 82 mg/dL (ref 70–99)
Glucose-Capillary: 85 mg/dL (ref 70–99)
Glucose-Capillary: 90 mg/dL (ref 70–99)
Glucose-Capillary: 95 mg/dL (ref 70–99)

## 2020-12-30 LAB — HEPARIN LEVEL (UNFRACTIONATED)
Heparin Unfractionated: 0.23 IU/mL — ABNORMAL LOW (ref 0.30–0.70)
Heparin Unfractionated: 0.2 IU/mL — ABNORMAL LOW (ref 0.30–0.70)

## 2020-12-30 LAB — VANCOMYCIN, RANDOM: Vancomycin Rm: 82

## 2020-12-30 LAB — PHOSPHORUS: Phosphorus: 5 mg/dL — ABNORMAL HIGH (ref 2.5–4.6)

## 2020-12-30 LAB — MAGNESIUM: Magnesium: 2 mg/dL (ref 1.7–2.4)

## 2020-12-30 IMAGING — XA IR TUNNELED CV CATH W/O PORT/PUMP >5YO
1 series · 1 of 1 positions shown · non-contrast
Comparison: none

INDICATION: 69-year-old female with a history of end-stage renal disease on
dialysis. She has a malfunctioning right upper extremity AV fistula
in the setting of right innominate venous stenosis/stent occlusion.
She underwent placement of a temporary HD catheter via a left IJ
approach on [DATE] and now presents for conversion to a more
durable tunneled catheter.

[Series 1: fl (-) angio · 1 of 1 slices shown]
[im 1/1]
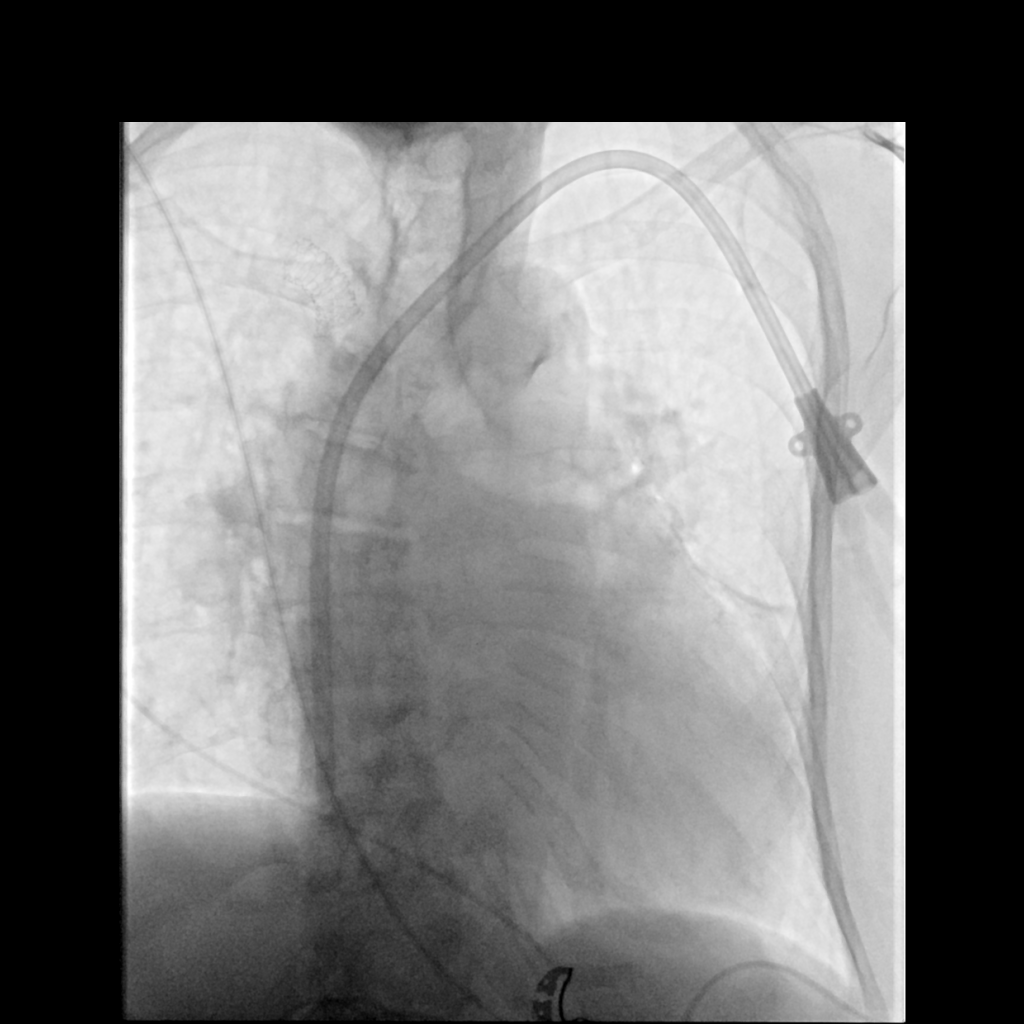

[1 of 1 positions shown; findings below may reference images not displayed]

EXAM:
TUNNELED CENTRAL VENOUS HEMODIALYSIS CATHETER PLACEMENT WITH
FLUOROSCOPIC GUIDANCE

MEDICATIONS:
1 g vancomycin. The antibiotic was given in an appropriate time
interval prior to skin puncture.

ANESTHESIA/SEDATION:
Moderate (conscious) sedation was employed during this procedure. A
total of Versed 1 mg and Fentanyl 25 mcg was administered
intravenously.

Moderate Sedation Time: 15 minutes. The patient's level of
consciousness and vital signs were monitored continuously by
radiology nursing throughout the procedure under my direct
supervision.

FLUOROSCOPY TIME:  Fluoroscopy Time: 0 minutes 18 seconds (0 mGy).

COMPLICATIONS:
None immediate.

PROCEDURE:
Informed written consent was obtained from the patient after a
discussion of the risks, benefits, and alternatives to treatment.
Questions regarding the procedure were encouraged and answered. The
left neck and chest were prepped with chlorhexidine in a sterile
fashion, and a sterile drape was applied covering the operative
field. Maximum barrier sterile technique with sterile gowns and
gloves were used for the procedure. A timeout was performed prior to
the initiation of the procedure.

A stiff Glidewire was advanced through the existing temporary
hemodialysis catheter to the level of the. A suitable skin exit site
was selected inferior to the clavicle. Local anesthesia was attained
by infiltration with 1% lidocaine. A small dermatotomy was made. A
Palindrome tunneled hemodialysis catheter measuring 23 cm from tip
to cuff was tunneled in a retrograde fashion from the anterior chest
wall to the venotomy incision.

The existing non tunneled hemodialysis catheter was then removed
over the stiff Glidewire. A peel-away sheath was advanced over the
Glidewire and positioned in the SVC. The new tunneled catheter was
then placed through the peel-away sheath with tips ultimately
positioned within the superior aspect of the right atrium. Final
catheter positioning was confirmed and documented with a spot
radiographic image. The catheter aspirates and flushes normally. The
catheter was flushed with appropriate volume heparin dwells.

The catheter exit site was secured with a 0-Silk retention sutures.
The venotomy incision was closed with Dermabond. Dressings were
applied. The patient tolerated the procedure well without immediate
post procedural complication.
IMPRESSION: Successful conversion to a new 23 cm tip to cuff palindrome tunneled
hemodialysis catheter via the left internal jugular vein with tips
terminating within the superior aspect of the right atrium. The
catheter is ready for immediate use.

## 2020-12-30 MED ORDER — MIDAZOLAM HCL 2 MG/2ML IJ SOLN
INTRAMUSCULAR | Status: AC | PRN
  Administered 2020-12-30 (×2): .5 mg via INTRAVENOUS

## 2020-12-30 MED ORDER — FENTANYL CITRATE (PF) 100 MCG/2ML IJ SOLN
INTRAMUSCULAR | Status: AC
  Filled 2020-12-30: qty 2

## 2020-12-30 MED ORDER — FENTANYL CITRATE (PF) 100 MCG/2ML IJ SOLN
INTRAMUSCULAR | Status: AC | PRN
  Administered 2020-12-30: 25 ug via INTRAVENOUS

## 2020-12-30 MED ORDER — DEXTROSE 50 % IV SOLN
INTRAVENOUS | Status: AC
  Administered 2020-12-30: 50 mL
  Filled 2020-12-30: qty 50

## 2020-12-30 MED ORDER — VANCOMYCIN HCL IN DEXTROSE 1-5 GM/200ML-% IV SOLN
INTRAVENOUS | Status: AC
  Filled 2020-12-30: qty 200

## 2020-12-30 MED ORDER — GELATIN ABSORBABLE 12-7 MM EX MISC
CUTANEOUS | Status: AC
  Filled 2020-12-30: qty 1

## 2020-12-30 MED ORDER — LIDOCAINE HCL 1 % IJ SOLN
INTRAMUSCULAR | Status: AC
  Filled 2020-12-30: qty 20

## 2020-12-30 MED ORDER — DEXTROSE 50 % IV SOLN
25.0000 mL | Freq: Once | INTRAVENOUS | Status: AC
  Administered 2020-12-30: 25 mL via INTRAVENOUS
  Filled 2020-12-30: qty 50

## 2020-12-30 MED ORDER — VANCOMYCIN HCL IN DEXTROSE 1-5 GM/200ML-% IV SOLN
INTRAVENOUS | Status: AC | PRN
  Administered 2020-12-30: 1000 mg via INTRAVENOUS

## 2020-12-30 MED ORDER — MIDAZOLAM HCL 2 MG/2ML IJ SOLN
INTRAMUSCULAR | Status: AC
  Filled 2020-12-30: qty 2

## 2020-12-30 MED ORDER — HEPARIN SODIUM (PORCINE) 1000 UNIT/ML IJ SOLN
INTRAMUSCULAR | Status: AC
  Administered 2020-12-30: 3.8 mL
  Filled 2020-12-30: qty 1

## 2020-12-30 MED ORDER — HEPARIN (PORCINE) 25000 UT/250ML-% IV SOLN
900.0000 [IU]/h | INTRAVENOUS | Status: AC
  Administered 2020-12-30: 800 [IU]/h via INTRAVENOUS
  Administered 2021-01-01: 900 [IU]/h via INTRAVENOUS
  Filled 2020-12-30 (×2): qty 250

## 2020-12-30 MED ORDER — CHLORHEXIDINE GLUCONATE 4 % EX LIQD
CUTANEOUS | Status: AC
  Filled 2020-12-30: qty 15

## 2020-12-30 NOTE — Progress Notes (Signed)
[place] for heparin/vanc Indication: atrial fibrillation/cath infection  Allergies  Allergen Reactions   Lactose Intolerance (Gi) Other (See Comments)    On MAR   Latex Rash and Itching   Penicillins Rash and Swelling    Has patient had a PCN reaction causing immediate rash, facial/tongue/throat swelling, SOB or lightheadedness with hypotension: No Has patient had a PCN reaction causing severe rash involving mucus membranes or skin necrosis: No Has patient had a PCN reaction that required hospitalization No Has patient had a PCN reaction occurring within the last 10 years: No If all of the above answers are "NO", then may proceed with Cephalosporin use.      Patient Measurements: Height: 5' 2.5" (158.8 cm) Weight: 45.7 kg (100 lb 12 oz) IBW/kg (Calculated) : 51.25 Heparin Dosing Weight: TBW  Vital Signs: Temp: 98 F (36.7 C) (10/31 1224) Temp Source: Oral (10/31 0844) BP: 145/81 (10/31 1224) Pulse Rate: 53 (10/31 1224)  Labs: Recent Labs    12/28/20 0247 12/28/20 0735 12/29/20 1022 12/29/20 2044 12/30/20 0503  HGB 10.9*  --  10.9*  --  10.0*  HCT 33.0*  --  34.0*  --  32.1*  PLT 161  --  174  --  147*  APTT  --   --  35  --   --   HEPARINUNFRC  --   --  0.11* 0.20* 0.23*  CREATININE  --  7.35*  --   --  6.86*     Estimated Creatinine Clearance: 5.7 mL/min (A) (by C-G formula based on SCr of 6.86 mg/dL (H)).   Assessment: 25 yof admitted with an infected RUE fistula. Apixaban from PTA for Aflutter held on admit, last dose 10/24. She is s/p ligation right arm AV fistula and I&D right arm wound on 10/28. She had Afib with RVR in HD 10/29. Pharmacy consulted to begin heparin drip with no bolus; will also attempt to keep goal range on low end due to low Hgb needing transfusion on 10/28.   Heparin level remains subtherapeutic (0.23) on gtt at 650 units/hr. No issues with line or bleeding reported per RN. Pt to go to IR today for  tunneled catheter placement.  Pt is s/p TDC>>Palindrome conversion. Heparin was held for the procedure and ok to resume 2 hrs post procedure. She is on vanc already but she got 1 dose of vanc last night and 1 dose preop today. Suspecting that her level will be very elevated. She is not schedule for HD until AM. We will get random level today.   Goal of Therapy:  Heparin level 0.3-0.5 units/ml Monitor platelets by anticoagulation protocol: Yes PreHD vanc: 15-25   Plan:   Restart heparin infusion 800 units/hr  8 hr heparin level Hold vanc Random vanc level today  Onnie Boer, PharmD, [place], AAHIVP, CPP Infectious Disease Pharmacist 12/30/2020 12:59 PM

## 2020-12-30 NOTE — Progress Notes (Signed)
ID PROGRESS NOTE  70yo F with infected right arm AVF- found to have +MRSA cultures. Will narrow abtx to vancomycin post HD. Will review OR note to see if need to add rifampin.  [name] Helper for Infectious Diseases [phone]

## 2020-12-30 NOTE — Progress Notes (Signed)
Patient ID: [name], female   DOB: 1950-05-23, 70 y.o.   MRN: [id number] S: Patient tired this morning but lying in bed with no apparent distress.  No complaints   O:BP (!) 145/81 (BP Location: Left Leg)   Pulse (!) 53   Temp 98 F (36.7 C)   Resp 18   Ht 5' 2.5" (1.588 m)   Wt 45.7 kg   LMP  (LMP Unknown)   SpO2 99%   BMI 18.13 kg/m   Intake/Output Summary (Last 24 hours) at 12/30/2020 1324 Last data filed at 12/30/2020 0626 Gross per 24 hour  Intake 648.01 ml  Output 0 ml  Net 648.01 ml   Intake/Output: I/O last 3 completed shifts: In: 6948 [P.O.:990; I.V.:127.2; IV Piggyback:200.8] Out: 0   Intake/Output this shift:  No intake/output data recorded. Weight change:  Gen:NAD-  left temp cath CVS: sounds reg in 70s currently Resp:CTA Abd: +BS,s oft, NT/ND Ext: RUE with dressing from wrist to mid bicep.    Recent Labs  Lab 12/24/20 1518 12/25/20 0409 12/26/20 0508 12/27/20 0958 12/28/20 0735 12/30/20 0503  NA 132* 131* 130* 137 137 136  K 5.3* 4.9 6.2* 4.0 4.0 3.4*  CL 92* 91* 93* 98 100 102  CO2 23 21* 20* 27 25 24   GLUCOSE 68* 61* 72 89 77 71  BUN 87* 93* 103* 30* 40* 33*  CREATININE 10.98* 11.47* 13.14* 6.26* 7.35* 6.86*  ALBUMIN 2.9* 2.6*  --   --  2.3*  --   CALCIUM 8.9 9.0 9.2 9.4 9.3 8.2*  PHOS  --   --   --   --  7.6*  --   AST 12* 10*  --   --   --   --   ALT 9 7  --   --   --   --    Liver Function Tests: Recent Labs  Lab 12/24/20 1518 12/25/20 0409 12/28/20 0735  AST 12* 10*  --   ALT 9 7  --   ALKPHOS 88 87  --   BILITOT 0.6 0.9  --   PROT 7.8 7.1  --   ALBUMIN 2.9* 2.6* 2.3*   No results for input(s): LIPASE, AMYLASE in the last 168 hours. No results for input(s): AMMONIA in the last 168 hours. CBC: Recent Labs  Lab 12/24/20 1518 12/25/20 0409 12/26/20 0508 12/27/20 0156 12/27/20 0958 12/28/20 0247 12/29/20 1022 12/30/20 0503  WBC 9.2 8.1 8.8 5.9  --  9.8 10.6* 6.5  NEUTROABS 7.6 6.1  --   --   --   --   --   --    HGB 7.8* 7.3* 7.4* 6.6*   < > 10.9* 10.9* 10.0*  HCT 25.7* 23.9* 24.2* 21.6*   < > 33.0* 34.0* 32.1*  MCV 109.8* 107.7* 103.9* 103.3*  --  96.8 97.7 99.4  PLT 205 194 174 164  --  161 174 147*   < > = values in this interval not displayed.   Cardiac Enzymes: No results for input(s): CKTOTAL, CKMB, CKMBINDEX, TROPONINI in the last 168 hours. CBG: Recent Labs  Lab 12/30/20 0014 12/30/20 0659 12/30/20 0753 12/30/20 0840 12/30/20 1223  GLUCAP 82 90 69* 85 117*    Iron Studies: No results for input(s): IRON, TIBC, TRANSFERRIN, FERRITIN in the last 72 hours. Studies/Results: IR TUNNELED CENTRAL VENOUS CATHETER PLACEMENT  Result Date: 12/30/2020 INDICATION: 70 year old female with a history of end-stage renal disease on dialysis. She has a malfunctioning right upper extremity AV  fistula in the setting of right innominate venous stenosis/stent occlusion. She underwent placement of a temporary HD catheter via a left IJ approach on 12/26/2020 and now presents for conversion to a more durable tunneled catheter. EXAM: TUNNELED CENTRAL VENOUS HEMODIALYSIS CATHETER PLACEMENT WITH FLUOROSCOPIC GUIDANCE MEDICATIONS: 1 g vancomycin. The antibiotic was given in an appropriate time interval prior to skin puncture. ANESTHESIA/SEDATION: Moderate (conscious) sedation was employed during this procedure. A total of Versed 1 mg and Fentanyl 25 mcg was administered intravenously. Moderate Sedation Time: 15 minutes. The patient's level of consciousness and vital signs were monitored continuously by radiology nursing throughout the procedure under my direct supervision. FLUOROSCOPY TIME:  Fluoroscopy Time: 0 minutes 18 seconds (0 mGy). COMPLICATIONS: None immediate. PROCEDURE: Informed written consent was obtained from the patient after a discussion of the risks, benefits, and alternatives to treatment. Questions regarding the procedure were encouraged and answered. The left neck and chest were prepped with  chlorhexidine in a sterile fashion, and a sterile drape was applied covering the operative field. Maximum barrier sterile technique with sterile gowns and gloves were used for the procedure. A timeout was performed prior to the initiation of the procedure. A stiff Glidewire was advanced through the existing temporary hemodialysis catheter to the level of the. A suitable skin exit site was selected inferior to the clavicle. Local anesthesia was attained by infiltration with 1% lidocaine. A small dermatotomy was made. A Palindrome tunneled hemodialysis catheter measuring 23 cm from tip to cuff was tunneled in a retrograde fashion from the anterior chest wall to the venotomy incision. The existing non tunneled hemodialysis catheter was then removed over the stiff Glidewire. A peel-away sheath was advanced over the Glidewire and positioned in the SVC. The new tunneled catheter was then placed through the peel-away sheath with tips ultimately positioned within the superior aspect of the right atrium. Final catheter positioning was confirmed and documented with a spot radiographic image. The catheter aspirates and flushes normally. The catheter was flushed with appropriate volume heparin dwells. The catheter exit site was secured with a 0-Silk retention sutures. The venotomy incision was closed with Dermabond. Dressings were applied. The patient tolerated the procedure well without immediate post procedural complication. IMPRESSION: Successful conversion to a new 23 cm tip to cuff palindrome tunneled hemodialysis catheter via the left internal jugular vein with tips terminating within the superior aspect of the right atrium. The catheter is ready for immediate use. Electronically Signed   By: Jacqulynn Cadet M.D.   On: 12/30/2020 12:32    calcitRIOL  1.25 mcg Oral Q T,Th,Sa-HD   chlorhexidine       Chlorhexidine Gluconate Cloth  6 each Topical Q0600   cinacalcet  60 mg Oral Q T,Th,Sa-HD   darbepoetin (ARANESP)  injection - DIALYSIS  200 mcg Intravenous Q Thu-HD   dolutegravir  50 mg Oral q1600   fentaNYL  1 patch Transdermal Q72H   fentaNYL       gelatin adsorbable       lamiVUDine  50 mg Oral Daily   methadone  2.5 mg Oral Q2000   metoprolol tartrate  50 mg Oral BID   midazolam       mirtazapine  15 mg Oral QHS   multivitamin  1 tablet Oral QHS   pantoprazole  40 mg Oral QAC breakfast   venlafaxine XR  150 mg Oral Q breakfast   zidovudine  300 mg Oral Daily    BMET    Component Value Date/Time   NA  136 12/30/2020 0503   K 3.4 (L) 12/30/2020 0503   CL 102 12/30/2020 0503   CO2 24 12/30/2020 0503   GLUCOSE 71 12/30/2020 0503   BUN 33 (H) 12/30/2020 0503   CREATININE 6.86 (H) 12/30/2020 0503   CREATININE 5.66 (H) 12/22/2019 1117   CALCIUM 8.2 (L) 12/30/2020 0503   CALCIUM 9.0 03/12/2011 0527   GFRNONAA 6 (L) 12/30/2020 0503   GFRNONAA 5 (L) 02/12/2015 1000   GFRAA 9 (L) 10/22/2019 0105   GFRAA 5 (L) 02/12/2015 1000   CBC    Component Value Date/Time   WBC 6.5 12/30/2020 0503   RBC 3.23 (L) 12/30/2020 0503   HGB 10.0 (L) 12/30/2020 0503   HCT 32.1 (L) 12/30/2020 0503   PLT 147 (L) 12/30/2020 0503   MCV 99.4 12/30/2020 0503   MCH 31.0 12/30/2020 0503   MCHC 31.2 12/30/2020 0503   RDW 19.3 (H) 12/30/2020 0503   LYMPHSABS 1.1 12/25/2020 0409   MONOABS 0.7 12/25/2020 0409   EOSABS 0.1 12/25/2020 0409   BASOSABS 0.0 12/25/2020 0409    Dialysis Orders: Center: [facility]  on TTS . EDW 41.5kg HD Bath 2K/2.5Ca  Time 3 hours Heparin 1000 units bolus then 500 units/hr. 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  appreciate vascular surgery evaluation-  s/p ligation and debridement 10/28.  On vanc. MRSA on culture  ESRD -  Last HD 10/29 Sat, undergoing TDC today.  Hopefully can discharge once  this is in place.  Continue TTS schedule  Hypertension/volume  - stable- no BP meds-  Uf as able-  has prespecified max of 3.5 liters  Anemia  - Hb 10s. continue with ESA.    Metabolic bone disease -  continue with calcitriol and sensipar-  Phos 7.6 on 10/29. Repeat today  Nutrition - renal diet. Hypoglycemia - PRN D50  AFib/FL:  improved with increased metoprolol dose HIV on meds   [name]

## 2020-12-30 NOTE — Progress Notes (Signed)
PROGRESS NOTE    [name]  DGL:[id number] DOB: [birth year] DOA: 12/24/2020 PCP: Hilbert Corrigan, MD   Brief Narrative:  70 year old with history of ESRD on HD Tuesday Thursday Saturday, HIV, a flutter on Eliquis comes to the hospital with complaints of right upper extremity AV fistula infection.  She had an AV fistula occlusion about 4 weeks ago and underwent balloon angioplasty of the right subclavian and right innominate vein with conversion to right forearm fistula to right upper extremity fistula.  She initially went to [facility] where she was diagnosed with AV fistula infection and transferred to [facility].  Vascular, nephro, IR and infectious disease are consulted.  Patient underwent ligation of AV fistula and I&D on 12/27/2020. Temp HD cath was placed by IR with plans to place tunneled catheter.   Assessment & Plan:   Active Problems:   Human immunodeficiency virus (HIV) disease ([place])   Essential hypertension   ESRD on dialysis [facility])   Atrial flutter ([place])   Anemia of chronic disease   Infected fluid collection with fistula   Hypoglycemia   Right AV fistula infection now with purulent discharge -On vancomycin Adjustment per ID depending on culture data - Vascular performed I&D/ligation of AV fistula on 12/19/2020.  Dressing management per their service. Ok to start [facility] per vasc.  -Infectious disease following, Wound Cx shows MRSA - Pain control, dry dressing.  ESRD on hemodialysis TTS - Temporary HD cath in place, continue dialysis per nephrology.  Plans to place tunneled catheter today.  Currently on heparin drip.  Hyperkalemia -Resolved.    Atrial flutter/fibrillation.  Rate better controlled -Continue metoprolol to 50mg  BID  Chronically on Eliquis, currently on heparin drip  History of HIV - CD4 count 252, continue antiretrovirals  Anemia of chronic disease - Baseline hemoglobin 8.0.  Continue to monitor  History of chronic pain  syndrome - On fentanyl patch and methadone  DVT prophylaxis: Hep Drip Code Status: Full Family Communication:  None  Status is: Inpatient  Remains inpatient appropriate because: Ongoing treatment for infected AV fistula with IV antibiotics.  Maintain hospital stay until cleared by infectious disease   Nutritional status  Nutrition Problem: Severe Malnutrition Etiology: chronic illness (ESRD on HD)  Signs/Symptoms: severe fat depletion, severe muscle depletion  Interventions: Snacks, MVI, Other (Comment) (double protein)  Body mass index is 18.13 kg/m.  Pressure Injury 10/21/19 Sacrum (Active)  10/21/19 1807  Location: Sacrum  Location Orientation:   Staging:   Wound Description (Comments):   Present on Admission:      Subjective: Remains afebrile, does not have any complaints at this time.  General: Denies fever, chills, night sweats or unintended weight loss.  Review of systems: Resp: Denies cough, wheezing, shortness of breath. Cardiac: Denies chest pain, palpitations, orthopnea, paroxysmal nocturnal dyspnea. GI: Denies abdominal pain, nausea, vomiting, diarrhea or constipation GU: Denies dysuria, frequency, hesitancy or incontinence MS: Denies muscle aches, joint pain or swelling Neuro: Denies headache, neurologic deficits (focal weakness, numbness, tingling), abnormal gait Psych: Denies anxiety, depression, SI/HI/AVH Skin: Denies new rashes or lesions ID: Denies sick contacts, exotic exposures, travel    Examination:  Constitutional: Not in acute distress Respiratory: Clear to auscultation bilaterally Cardiovascular: Normal sinus rhythm, no rubs Abdomen: Nontender nondistended good bowel sounds Musculoskeletal: No edema noted Skin: Right upper extremity dressing in place Neurologic: CN 2-12 grossly intact.  And nonfocal Psychiatric: Normal judgment and insight. Alert and oriented x 3. Normal mood.  Objective: Vitals:   12/30/20 0435  12/30/20 0844  12/30/20 1020 12/30/20 1135  BP: 137/78 (!) 142/81 (!) 162/99 135/83  Pulse: 75 (!) 59 66 64  Resp: 17 16 15 20   Temp: 98.4 F (36.9 C) 98.4 F (36.9 C)    TempSrc: Oral Oral    SpO2: 98% 100% 99% 100%  Weight:      Height:        Intake/Output Summary (Last 24 hours) at 12/30/2020 1140 Last data filed at 12/30/2020 0626 Gross per 24 hour  Intake 898.01 ml  Output 0 ml  Net 898.01 ml   Filed Weights   12/27/20 2020 12/28/20 0853 12/28/20 1149  Weight: 48.5 kg 47 kg 45.7 kg     Data Reviewed:   CBC: Recent Labs  Lab 12/24/20 1518 12/25/20 0409 12/26/20 0508 12/27/20 0156 12/27/20 0958 12/28/20 0247 12/29/20 1022 12/30/20 0503  WBC 9.2 8.1 8.8 5.9  --  9.8 10.6* 6.5  NEUTROABS 7.6 6.1  --   --   --   --   --   --   HGB 7.8* 7.3* 7.4* 6.6* 8.2* 10.9* 10.9* 10.0*  HCT 25.7* 23.9* 24.2* 21.6* 26.7* 33.0* 34.0* 32.1*  MCV 109.8* 107.7* 103.9* 103.3*  --  96.8 97.7 99.4  PLT 205 194 174 164  --  161 174 287*   Basic Metabolic Panel: Recent Labs  Lab 12/24/20 1518 12/25/20 0409 12/26/20 0508 12/27/20 0156 12/27/20 0958 12/28/20 0247 12/28/20 0735 12/29/20 1022 12/30/20 0503  NA 132* 131* 130*  --  137  --  137  --  136  K 5.3* 4.9 6.2*  --  4.0  --  4.0  --  3.4*  CL 92* 91* 93*  --  98  --  100  --  102  CO2 23 21* 20*  --  27  --  25  --  24  GLUCOSE 68* 61* 72  --  89  --  77  --  71  BUN 87* 93* 103*  --  30*  --  40*  --  33*  CREATININE 10.98* 11.47* 13.14*  --  6.26*  --  7.35*  --  6.86*  CALCIUM 8.9 9.0 9.2  --  9.4  --  9.3  --  8.2*  MG 2.2  --   --  2.0  --  2.2  --  2.0 2.0  PHOS  --   --   --   --   --   --  7.6*  --   --    GFR: Estimated Creatinine Clearance: 5.7 mL/min (A) (by C-G formula based on SCr of 6.86 mg/dL (H)). Liver Function Tests: Recent Labs  Lab 12/24/20 1518 12/25/20 0409 12/28/20 0735  AST 12* 10*  --   ALT 9 7  --   ALKPHOS 88 87  --   BILITOT 0.6 0.9  --   PROT 7.8 7.1  --   ALBUMIN 2.9* 2.6* 2.3*   No  results for input(s): LIPASE, AMYLASE in the last 168 hours. No results for input(s): AMMONIA in the last 168 hours. Coagulation Profile: No results for input(s): INR, PROTIME in the last 168 hours. Cardiac Enzymes: No results for input(s): CKTOTAL, CKMB, CKMBINDEX, TROPONINI in the last 168 hours. BNP (last 3 results) No results for input(s): PROBNP in the last 8760 hours. HbA1C: No results for input(s): HGBA1C in the last 72 hours. CBG: Recent Labs  Lab 12/29/20 2019 12/30/20 0014 12/30/20 0659 12/30/20 0753 12/30/20 0840  GLUCAP 100*  82 90 69* 85   Lipid Profile: No results for input(s): CHOL, HDL, LDLCALC, TRIG, CHOLHDL, LDLDIRECT in the last 72 hours. Thyroid Function Tests: No results for input(s): TSH, T4TOTAL, FREET4, T3FREE, THYROIDAB in the last 72 hours. Anemia Panel: No results for input(s): VITAMINB12, FOLATE, FERRITIN, TIBC, IRON, RETICCTPCT in the last 72 hours. Sepsis Labs: Recent Labs  Lab 12/24/20 1518 12/24/20 1658  LATICACIDVEN 1.6 1.0    Recent Results (from the past 240 hour(s))  Resp Panel by RT-PCR (Flu A&B, Covid) Nasopharyngeal Swab     Status: None   Collection Time: 12/24/20  2:53 PM   Specimen: Nasopharyngeal Swab; Nasopharyngeal(NP) swabs in vial transport medium  Result Value Ref Range Status   SARS Coronavirus 2 by RT PCR NEGATIVE NEGATIVE Final    Comment: (NOTE) SARS-CoV-2 target nucleic acids are NOT DETECTED.  The SARS-CoV-2 RNA is generally detectable in upper respiratory specimens during the acute phase of infection. The lowest concentration of SARS-CoV-2 viral copies this assay can detect is 138 copies/mL. A negative result does not preclude SARS-Cov-2 infection and should not be used as the sole basis for treatment or other patient management decisions. A negative result may occur with  improper specimen collection/handling, submission of specimen other than nasopharyngeal swab, presence of viral mutation(s) within the areas  targeted by this assay, and inadequate number of viral copies(<138 copies/mL). A negative result must be combined with clinical observations, patient history, and epidemiological information. The expected result is Negative.  Fact Sheet for Patients:  EntrepreneurPulse.com.au  Fact Sheet for Healthcare Providers:  IncredibleEmployment.be  This test is no t yet approved or cleared by the [place] FDA and  has been authorized for detection and/or diagnosis of SARS-CoV-2 by FDA under an Emergency Use Authorization (EUA). This EUA will remain  in effect (meaning this test can be used) for the duration of the COVID-19 declaration under Section 564(b)(1) of the Act, 21 U.S.C.section 360bbb-3(b)(1), unless the authorization is terminated  or revoked sooner.       Influenza A by PCR NEGATIVE NEGATIVE Final   Influenza B by PCR NEGATIVE NEGATIVE Final    Comment: (NOTE) The Xpert Xpress SARS-CoV-2/FLU/RSV plus assay is intended as an aid in the diagnosis of influenza from Nasopharyngeal swab specimens and should not be used as a sole basis for treatment. Nasal washings and aspirates are unacceptable for Xpert Xpress SARS-CoV-2/FLU/RSV testing.  Fact Sheet for Patients: EntrepreneurPulse.com.au  Fact Sheet for Healthcare Providers: IncredibleEmployment.be  This test is not yet approved or cleared by the [place] FDA and has been authorized for detection and/or diagnosis of SARS-CoV-2 by FDA under an Emergency Use Authorization (EUA). This EUA will remain in effect (meaning this test can be used) for the duration of the COVID-19 declaration under Section 564(b)(1) of the Act, 21 U.S.C. section 360bbb-3(b)(1), unless the authorization is terminated or revoked.  Performed at [facility], [address] [place], [place] [address]   Blood culture (routine x 2)     Status: None   Collection Time: 12/24/20   3:19 PM   Specimen: BLOOD LEFT HAND  Result Value Ref Range Status   Specimen Description BLOOD LEFT HAND  Final   Special Requests   Final    BOTTLES DRAWN AEROBIC AND ANAEROBIC Blood Culture adequate volume   Culture   Final    NO GROWTH 5 DAYS Performed at [facility], [address] [place], [place] [address]    Report Status 12/29/2020 FINAL  Final  Blood culture (routine x 2)     Status: None   Collection Time: 12/24/20  3:19 PM   Specimen: BLOOD LEFT ARM  Result Value Ref Range Status   Specimen Description BLOOD LEFT ARM  Final   Special Requests   Final    BOTTLES DRAWN AEROBIC AND ANAEROBIC Blood Culture adequate volume   Culture   Final    NO GROWTH 5 DAYS Performed at [facility], [address] [place], [place] [address]    Report Status 12/29/2020 FINAL  Final  Surgical pcr screen     Status: None   Collection Time: 12/26/20 12:42 PM   Specimen: Nasal Mucosa; Nasal Swab  Result Value Ref Range Status   MRSA, PCR NEGATIVE NEGATIVE Final   Staphylococcus aureus NEGATIVE NEGATIVE Final    Comment: (NOTE) The Xpert SA Assay (FDA approved for NASAL specimens in patients 68 years of age and older), is one component of a comprehensive surveillance program. It is not intended to diagnose infection nor to guide or monitor treatment. Performed at [facility], [place] [address] [place], [place] [address]   Aerobic Culture w Gram Stain (superficial specimen)     Status: None   Collection Time: 12/27/20 12:19 PM   Specimen: Abscess; Wound  Result Value Ref Range Status   Specimen Description ABSCESS  Final   Special Requests ARM RIGHT  Final   Gram Stain   Final    FEW WBC PRESENT,BOTH PMN AND MONONUCLEAR FEW GRAM POSITIVE COCCI IN PAIRS Performed at [facility], [address], [place], [place] [address]    Culture   Final    MODERATE METHICILLIN RESISTANT STAPHYLOCOCCUS AUREUS   Report Status 12/29/2020 FINAL  Final   Organism ID, Bacteria  METHICILLIN RESISTANT STAPHYLOCOCCUS AUREUS  Final      Susceptibility   Methicillin resistant staphylococcus aureus - MIC*    CIPROFLOXACIN >=8 RESISTANT Resistant     ERYTHROMYCIN >=8 RESISTANT Resistant     GENTAMICIN <=0.5 SENSITIVE Sensitive     OXACILLIN >=4 RESISTANT Resistant     TETRACYCLINE <=1 SENSITIVE Sensitive     VANCOMYCIN 1 SENSITIVE Sensitive     TRIMETH/SULFA <=10 SENSITIVE Sensitive     CLINDAMYCIN <=0.25 SENSITIVE Sensitive     RIFAMPIN <=0.5 SENSITIVE Sensitive     Inducible Clindamycin NEGATIVE Sensitive     * MODERATE METHICILLIN RESISTANT STAPHYLOCOCCUS AUREUS  Aerobic/Anaerobic Culture w Gram Stain (surgical/deep wound)     Status: None (Preliminary result)   Collection Time: 12/27/20  2:39 PM   Specimen: Wound; Tissue  Result Value Ref Range Status   Specimen Description TISSUE  Final   Special Requests RIGHT ARM WOUND FOR CULTURES SPEC A  Final   Gram Stain   Final    RARE WBC PRESENT, PREDOMINANTLY PMN RARE GRAM POSITIVE COCCI IN PAIRS Performed at [facility], [place] [address] [place],  [address]    Culture   Final    RARE METHICILLIN RESISTANT STAPHYLOCOCCUS AUREUS NO ANAEROBES ISOLATED; CULTURE IN PROGRESS FOR 5 DAYS    Report Status PENDING  Incomplete   Organism ID, Bacteria METHICILLIN RESISTANT STAPHYLOCOCCUS AUREUS  Final      Susceptibility   Methicillin resistant staphylococcus aureus - MIC*    CIPROFLOXACIN >=8 RESISTANT Resistant     ERYTHROMYCIN >=8 RESISTANT Resistant     GENTAMICIN <=0.5 SENSITIVE Sensitive     OXACILLIN >=4 RESISTANT Resistant     TETRACYCLINE <=1 SENSITIVE Sensitive  VANCOMYCIN 1 SENSITIVE Sensitive     TRIMETH/SULFA <=10 SENSITIVE Sensitive     CLINDAMYCIN <=0.25 SENSITIVE Sensitive     RIFAMPIN <=0.5 SENSITIVE Sensitive     Inducible Clindamycin NEGATIVE Sensitive     * RARE METHICILLIN RESISTANT STAPHYLOCOCCUS AUREUS         Radiology Studies: No results found.      Scheduled  Meds:  calcitRIOL  1.25 mcg Oral Q T,Th,Sa-HD   chlorhexidine       Chlorhexidine Gluconate Cloth  6 each Topical Q0600   cinacalcet  60 mg Oral Q T,Th,Sa-HD   darbepoetin (ARANESP) injection - DIALYSIS  200 mcg Intravenous Q Thu-HD   dolutegravir  50 mg Oral q1600   fentaNYL  1 patch Transdermal Q72H   fentaNYL       heparin sodium (porcine)       lamiVUDine  50 mg Oral Daily   lidocaine       methadone  2.5 mg Oral Q2000   metoprolol tartrate  50 mg Oral BID   midazolam       mirtazapine  15 mg Oral QHS   multivitamin  1 tablet Oral QHS   pantoprazole  40 mg Oral QAC breakfast   [START ON 12/31/2020] vancomycin  500 mg Intravenous Q T,Th,Sa-HD   venlafaxine XR  150 mg Oral Q breakfast   zidovudine  300 mg Oral Daily   Continuous Infusions:  heparin 800 Units/hr (12/30/20 0626)   vancomycin       LOS: 6 days   Time spent= 35 mins    Tina Kiger Arsenio Loader, MD [facility]  If 7PM-7AM, please contact night-coverage  12/30/2020, 11:40 AM

## 2020-12-30 NOTE — Progress Notes (Signed)
    S/p tdc with IR. Dsg changed at bedside. Plan for OR tomorrow to get some coverage over exposed av fistula with possible wound vac.   Servando Snare, MD

## 2020-12-30 NOTE — Progress Notes (Signed)
[place] for heparin Indication: atrial fibrillation  Allergies  Allergen Reactions   Lactose Intolerance (Gi) Other (See Comments)    On MAR   Latex Rash and Itching   Penicillins Rash and Swelling    Has patient had a PCN reaction causing immediate rash, facial/tongue/throat swelling, SOB or lightheadedness with hypotension: No Has patient had a PCN reaction causing severe rash involving mucus membranes or skin necrosis: No Has patient had a PCN reaction that required hospitalization No Has patient had a PCN reaction occurring within the last 10 years: No If all of the above answers are "NO", then may proceed with Cephalosporin use.      Patient Measurements: Height: 5' 2.5" (158.8 cm) Weight: 45.7 kg (100 lb 12 oz) IBW/kg (Calculated) : 51.25 Heparin Dosing Weight: TBW  Vital Signs: Temp: 99.1 F (37.3 C) (10/30 2113) Temp Source: Oral (10/30 2113) BP: 145/78 (10/30 2113) Pulse Rate: 80 (10/30 2113)  Labs: Recent Labs    12/27/20 0958 12/28/20 0247 12/28/20 0735 12/29/20 1022 12/29/20 2044 12/30/20 0503  HGB 8.2* 10.9*  --  10.9*  --  10.0*  HCT 26.7* 33.0*  --  34.0*  --  32.1*  PLT  --  161  --  174  --  147*  APTT  --   --   --  35  --   --   HEPARINUNFRC  --   --   --  0.11* 0.20* 0.23*  CREATININE 6.26*  --  7.35*  --   --   --      Estimated Creatinine Clearance: 5.4 mL/min (A) (by C-G formula based on SCr of 7.35 mg/dL (H)).   Assessment: 73 yof admitted with an infected RUE fistula. Apixaban from PTA for Aflutter held on admit, last dose 10/24. She is s/p ligation right arm AV fistula and I&D right arm wound on 10/28. She had Afib with RVR in HD 10/29. Pharmacy consulted to begin heparin drip with no bolus; will also attempt to keep goal range on low end due to low Hgb needing transfusion on 10/28.   Heparin level remains subtherapeutic (0.23) on gtt at 650 units/hr. No issues with line or bleeding reported per  RN. Pt to go to IR today for tunneled catheter placement.  Goal of Therapy:  Heparin level 0.3-0.5 units/ml Monitor platelets by anticoagulation protocol: Yes   Plan:   Increase heparin infusion to 800 units/hr  F/u after IR procedure  Thank you for involving pharmacy in this patient's care.  Sherlon Handing, PharmD, BCPS Please see amion for complete clinical pharmacist phone list 12/30/2020 6:04 AM

## 2020-12-30 NOTE — Care Management Important Message (Signed)
Important Message  Patient Details  Name: [name] MRN: [id number] Date of Birth: March 18, 1950   Medicare Important Message Given:  Yes     Yaniel Limbaugh 12/30/2020, 10:08 AM

## 2020-12-30 NOTE — TOC Progression Note (Signed)
Transition of Care [facility]) - Progression Note    Patient Details  Name: [name] MRN: [id number] Date of Birth: Apr 03, 1950  Transition of Care [facility]) CM/SW Contact  Emeterio Reeve, [place] Phone Number: 12/30/2020, 1:37 PM  Clinical Narrative:     CSW spoke to [place] at [place]. Pt is able to return when ready to discharge. Pt has been living at [facility] since April 2010 and is president of the resident advisors. Pt will need a covid test prior to discharge.   Expected Discharge Plan: [place] Barriers to Discharge: Continued Medical Work up  Expected Discharge Plan and Services Expected Discharge Plan: [place] Choice: [place] arrangements for the past 2 months: [place]                                       Social Determinants of Health (SDOH) Interventions    Readmission Risk Interventions Readmission Risk Prevention Plan 10/22/2019  Transportation Screening Complete  PCP or Specialist Appt within 3-5 Days Not Complete  Not Complete comments SNF MD to follow  [place] or [place] Not Complete  HRI or Home Care Consult comments Pt resides in a SNF  Social Work Consult for [place] Planning/Counseling Complete  Palliative Care Screening Not Applicable  Medication Review Press photographer) Complete  Some recent data might be hidden   Emeterio Reeve, LCSW Clinical Social Worker

## 2020-12-30 NOTE — Progress Notes (Signed)
[place] for heparin Indication: atrial fibrillation  Allergies  Allergen Reactions   Lactose Intolerance (Gi) Other (See Comments)    On MAR   Latex Rash and Itching   Penicillins Rash and Swelling    Has patient had a PCN reaction causing immediate rash, facial/tongue/throat swelling, SOB or lightheadedness with hypotension: No Has patient had a PCN reaction causing severe rash involving mucus membranes or skin necrosis: No Has patient had a PCN reaction that required hospitalization No Has patient had a PCN reaction occurring within the last 10 years: No If all of the above answers are "NO", then may proceed with Cephalosporin use.      Patient Measurements: Height: 5' 2.5" (158.8 cm) Weight: 45.7 kg (100 lb 12 oz) IBW/kg (Calculated) : 51.25 Heparin Dosing Weight: TBW  Vital Signs: Temp: 99 F (37.2 C) (10/31 2057) Temp Source: Oral (10/31 2057) BP: 156/84 (10/31 2057) Pulse Rate: 68 (10/31 2057)  Labs: Recent Labs    12/28/20 0247 12/28/20 0247 12/28/20 0735 12/29/20 1022 12/29/20 2044 12/30/20 0503 12/30/20 2217  HGB 10.9*  --   --  10.9*  --  10.0*  --   HCT 33.0*  --   --  34.0*  --  32.1*  --   PLT 161  --   --  174  --  147*  --   APTT  --   --   --  35  --   --   --   HEPARINUNFRC  --    < >  --  0.11* 0.20* 0.23* 0.20*  CREATININE  --   --  7.35*  --   --  6.86*  --    < > = values in this interval not displayed.     Estimated Creatinine Clearance: 5.7 mL/min (A) (by C-G formula based on SCr of 6.86 mg/dL (H)).   Assessment: 41 yof admitted with an infected RUE fistula. Apixaban from PTA for Aflutter held on admit, last dose 10/24. She is s/p ligation right arm AV fistula and I&D right arm wound on 10/28. She had Afib with RVR in HD 10/29. Pharmacy consulted to begin heparin drip with no bolus; will also attempt to keep goal range on low end due to low Hgb needing transfusion on 10/28.   Pt is s/p  TDC>>Palindrome conversion 10/31. Heparin resumed post procedure. Heparin level subtherapeutic (0.2) on gtt at 800 units/hr. RN moved heparin to upper part of arm and labs being drawn from wrist area - pt is restricted on other side.  Goal of Therapy:  Heparin level 0.3-0.5 units/ml Monitor platelets by anticoagulation protocol: Yes   Plan:   Increase heparin infusion to 900 units/hr  8 hr heparin level  Sherlon Handing, PharmD, BCPS Please see amion for complete clinical pharmacist phone list 12/30/2020 11:53 PM

## 2020-12-30 NOTE — Progress Notes (Signed)
Physical Therapy Treatment Patient Details Name: [name] MRN: [id number] DOB: 1950/06/18 Today's Date: 12/30/2020   History of Present Illness 70 y.o. female presents to [facility] hospital on 12/24/2020 with RUE fistula infection. Pt underwent temporary HDU catheter placement on 12/26/2020. S/p ligation and debridement 12/27/2020. PMH includes ESRD on HDU TTS, HIV, a flutter.    PT Comments    Pt with seemingly improved pain control and activity tolerance. Requiring min assist for stand pivot transfers from bed to chair. Presents with decreased functional use of RUE, generalized weakness,  impaired standing balance. Recommend return to [facility] once medically stable.     Recommendations for follow up therapy are one component of a multi-disciplinary discharge planning process, led by the attending physician.  Recommendations may be updated based on patient status, additional functional criteria and insurance authorization.  Follow Up Recommendations  Skilled nursing-short term rehab (<3 hours/day)     Assistance Recommended at Discharge Intermittent Supervision/Assistance  Equipment Recommendations   (defer to post-acute setting)    Recommendations for Other Services       Precautions / Restrictions Precautions Precautions: Fall Restrictions Weight Bearing Restrictions: No     Mobility  Bed Mobility Overal bed mobility: Needs Assistance Bed Mobility: Supine to Sit     Supine to sit: Mod assist     General bed mobility comments: Due to decreased functional use of RUE, PT provided modA at trunk to boost up to sitting position, use of bed pad to scoot hips anteriorly    Transfers Overall transfer level: Needs assistance Equipment used: 1 person hand held assist Transfers: Sit to/from [facility] Sit to Stand: Min assist Stand pivot transfers: Min assist         General transfer comment: MinA to rise and steady and then take pivotal steps  over to the chair    Ambulation/Gait                 Stairs             Wheelchair Mobility    Modified Rankin (Stroke Patients Only)       Balance Overall balance assessment: Needs assistance Sitting-balance support: Feet supported;Single extremity supported Sitting balance-Leahy Scale: Fair     Standing balance support: During functional activity;Single extremity supported Standing balance-Leahy Scale: Poor Standing balance comment: reliant on single UE support                            Cognition Arousal/Alertness: Awake/alert Behavior During Therapy: WFL for tasks assessed/performed Overall Cognitive Status: No family/caregiver present to determine baseline cognitive functioning                                 General Comments: pt follows command with increased time. Pt demonstrates slowed processing and impaired short term memory.        Exercises General Exercises - Upper Extremity Shoulder Flexion: AAROM;Right;10 reps;Seated    General Comments        Pertinent Vitals/Pain Pain Assessment: Faces Faces Pain Scale: Hurts little more Pain Location: RUE Pain Descriptors / Indicators: Aching Pain Intervention(s): Monitored during session;Premedicated before session;Repositioned    Home Living                          Prior Function  PT Goals (current goals can now be found in the care plan section) Acute Rehab PT Goals Patient Stated Goal: to reduce pain PT Goal Formulation: With patient Time For Goal Achievement: 01/10/21 Potential to Achieve Goals: Fair Progress towards PT goals: Progressing toward goals    Frequency    Min 2X/week      PT Plan      Co-evaluation              AM-PAC PT "6 Clicks" Mobility   Outcome Measure  Help needed turning from your back to your side while in a flat bed without using bedrails?: A Little Help needed moving from lying on your back to  sitting on the side of a flat bed without using bedrails?: A Lot Help needed moving to and from a bed to a chair (including a wheelchair)?: A Little Help needed standing up from a chair using your arms (e.g., wheelchair or bedside chair)?: A Little Help needed to walk in hospital room?: A Little Help needed climbing 3-5 steps with a railing? : A Lot 6 Click Score: 16    End of Session   Activity Tolerance: Patient tolerated treatment well Patient left: with call bell/phone within reach;in chair;with chair alarm set Nurse Communication: Mobility status PT Visit Diagnosis: Other abnormalities of gait and mobility (R26.89);Muscle weakness (generalized) (M62.81);Pain Pain - Right/Left: Right Pain - part of body: Arm     Time: 6010-9323 PT Time Calculation (min) (ACUTE ONLY): 13 min  Charges:  $Therapeutic Activity: 8-22 mins                     [name], PT, DPT Acute Rehabilitation Services Pager [phone] Office [phone]    Deno Etienne 12/30/2020, 11:08 AM

## 2020-12-30 NOTE — Procedures (Signed)
Interventional Radiology Procedure Note  Procedure: Conversion from temp HD catheter to a 23 cm Palindrome tunneled HD catheter.  Tips in RA and ready for use.   Complications: None  Estimated Blood Loss: None  Recommendations: - Routine line care   Signed,  Criselda Peaches, MD

## 2020-12-31 ENCOUNTER — Inpatient Hospital Stay (HOSPITAL_COMMUNITY): Payer: Medicare Other | Admitting: Certified Registered Nurse Anesthetist

## 2020-12-31 ENCOUNTER — Encounter (HOSPITAL_COMMUNITY)
Admission: EM | Disposition: A | Discharge status: Skilled Nursing Facility | Payer: Self-pay | Source: Home / Self Care | Attending: Internal Medicine

## 2020-12-31 ENCOUNTER — Encounter (HOSPITAL_COMMUNITY): Payer: Self-pay | Admitting: Family Medicine

## 2020-12-31 DIAGNOSIS — T827XXA Infection and inflammatory reaction due to other cardiac and vascular devices, implants and grafts, initial encounter: Secondary | ICD-10-CM | POA: Diagnosis not present

## 2020-12-31 DIAGNOSIS — B20 Human immunodeficiency virus [HIV] disease: Secondary | ICD-10-CM | POA: Diagnosis not present

## 2020-12-31 DIAGNOSIS — D638 Anemia in other chronic diseases classified elsewhere: Secondary | ICD-10-CM | POA: Diagnosis not present

## 2020-12-31 DIAGNOSIS — L089 Local infection of the skin and subcutaneous tissue, unspecified: Secondary | ICD-10-CM | POA: Diagnosis not present

## 2020-12-31 DIAGNOSIS — I4892 Unspecified atrial flutter: Secondary | ICD-10-CM | POA: Diagnosis not present

## 2020-12-31 HISTORY — PX: INCISION AND DRAINAGE: SHX5863

## 2020-12-31 LAB — BPAM RBC
Blood Product Expiration Date: 202211042359
Blood Product Expiration Date: 202211232359
Blood Product Expiration Date: 202211242359
Blood Product Expiration Date: 202211242359
Blood Product Expiration Date: 202211272359
ISSUE DATE / TIME: 202210270811
ISSUE DATE / TIME: 202210280554
ISSUE DATE / TIME: 202210281448
ISSUE DATE / TIME: 202210281448
Unit Type and Rh: 5100
Unit Type and Rh: 5100
Unit Type and Rh: 5100
Unit Type and Rh: 5100
Unit Type and Rh: 5100

## 2020-12-31 LAB — CBC
HCT: 32.4 % — ABNORMAL LOW (ref 36.0–46.0)
Hemoglobin: 10.1 g/dL — ABNORMAL LOW (ref 12.0–15.0)
MCH: 31 pg (ref 26.0–34.0)
MCHC: 31.2 g/dL (ref 30.0–36.0)
MCV: 99.4 fL (ref 80.0–100.0)
Platelets: 151 10*3/uL (ref 150–400)
RBC: 3.26 MIL/uL — ABNORMAL LOW (ref 3.87–5.11)
RDW: 18.8 % — ABNORMAL HIGH (ref 11.5–15.5)
WBC: 5.2 10*3/uL (ref 4.0–10.5)
nRBC: 0 % (ref 0.0–0.2)

## 2020-12-31 LAB — BASIC METABOLIC PANEL
Anion gap: 13 (ref 5–15)
BUN: 43 mg/dL — ABNORMAL HIGH (ref 8–23)
CO2: 22 mmol/L (ref 22–32)
Calcium: 8.7 mg/dL — ABNORMAL LOW (ref 8.9–10.3)
Chloride: 98 mmol/L (ref 98–111)
Creatinine, Ser: 8.25 mg/dL — ABNORMAL HIGH (ref 0.44–1.00)
GFR, Estimated: 5 mL/min — ABNORMAL LOW (ref >60)
Glucose, Bld: 81 mg/dL (ref 70–99)
Potassium: 3.9 mmol/L (ref 3.5–5.1)
Sodium: 133 mmol/L — ABNORMAL LOW (ref 135–145)

## 2020-12-31 LAB — TYPE AND SCREEN
ABO/RH(D): O POS
Antibody Screen: NEGATIVE
Unit division: 0
Unit division: 0
Unit division: 0
Unit division: 0
Unit division: 0

## 2020-12-31 LAB — GLUCOSE, CAPILLARY
Glucose-Capillary: 75 mg/dL (ref 70–99)
Glucose-Capillary: 88 mg/dL (ref 70–99)
Glucose-Capillary: 80 mg/dL (ref 70–99)
Glucose-Capillary: 85 mg/dL (ref 70–99)
Glucose-Capillary: 77 mg/dL (ref 70–99)
Glucose-Capillary: 86 mg/dL (ref 70–99)
Glucose-Capillary: 91 mg/dL (ref 70–99)

## 2020-12-31 LAB — MAGNESIUM: Magnesium: 2.1 mg/dL (ref 1.7–2.4)

## 2020-12-31 LAB — HEPARIN LEVEL (UNFRACTIONATED): Heparin Unfractionated: 0.4 IU/mL (ref 0.30–0.70)

## 2020-12-31 SURGERY — INCISION AND DRAINAGE
Anesthesia: General | Site: Arm Upper | Laterality: Right

## 2020-12-31 MED ORDER — CHLORHEXIDINE GLUCONATE 0.12 % MT SOLN
OROMUCOSAL | Status: AC
  Administered 2020-12-31: 15 mL via OROMUCOSAL
  Filled 2020-12-31: qty 15

## 2020-12-31 MED ORDER — PROPOFOL 10 MG/ML IV BOLUS
INTRAVENOUS | Status: DC | PRN
  Administered 2020-12-31: 80 mg via INTRAVENOUS

## 2020-12-31 MED ORDER — ONDANSETRON HCL 4 MG/2ML IJ SOLN
INTRAMUSCULAR | Status: AC
  Filled 2020-12-31: qty 2

## 2020-12-31 MED ORDER — ORAL CARE MOUTH RINSE: 15.0000 mL | Freq: Once | OROMUCOSAL | Status: AC

## 2020-12-31 MED ORDER — FENTANYL CITRATE (PF) 250 MCG/5ML IJ SOLN
INTRAMUSCULAR | Status: DC | PRN
  Administered 2020-12-31 (×5): 50 ug via INTRAVENOUS

## 2020-12-31 MED ORDER — SODIUM CHLORIDE 0.9 % IV SOLN: INTRAVENOUS | Status: DC | PRN

## 2020-12-31 MED ORDER — DEXAMETHASONE SODIUM PHOSPHATE 10 MG/ML IJ SOLN
INTRAMUSCULAR | Status: DC | PRN
  Administered 2020-12-31: 8 mg via INTRAVENOUS

## 2020-12-31 MED ORDER — OXYCODONE HCL 5 MG PO TABS: 5.0000 mg | ORAL_TABLET | Freq: Once | ORAL | Status: DC | PRN

## 2020-12-31 MED ORDER — VANCOMYCIN VARIABLE DOSE PER UNSTABLE RENAL FUNCTION (PHARMACIST DOSING): Status: DC

## 2020-12-31 MED ORDER — OXYCODONE HCL 5 MG/5ML PO SOLN: 5.0000 mg | Freq: Once | ORAL | Status: DC | PRN

## 2020-12-31 MED ORDER — FENTANYL CITRATE (PF) 100 MCG/2ML IJ SOLN: 25.0000 ug | INTRAMUSCULAR | Status: DC | PRN

## 2020-12-31 MED ORDER — CHLORHEXIDINE GLUCONATE 0.12 % MT SOLN: 15.0000 mL | Freq: Once | OROMUCOSAL | Status: AC

## 2020-12-31 MED ORDER — ONDANSETRON HCL 4 MG/2ML IJ SOLN
4.0000 mg | Freq: Four times a day (QID) | INTRAMUSCULAR | Status: AC | PRN
  Administered 2020-12-31: 4 mg via INTRAVENOUS

## 2020-12-31 MED ORDER — FENTANYL CITRATE (PF) 250 MCG/5ML IJ SOLN
INTRAMUSCULAR | Status: AC
  Filled 2020-12-31: qty 5

## 2020-12-31 MED ORDER — CHLORHEXIDINE GLUCONATE CLOTH 2 % EX PADS: 6.0000 | MEDICATED_PAD | Freq: Every day | CUTANEOUS | Status: DC

## 2020-12-31 MED ORDER — LACTATED RINGERS IV SOLN: INTRAVENOUS | Status: DC

## 2020-12-31 MED ORDER — ONDANSETRON HCL 4 MG/2ML IJ SOLN
INTRAMUSCULAR | Status: DC | PRN
  Administered 2020-12-31: 4 mg via INTRAVENOUS

## 2020-12-31 MED ORDER — LIDOCAINE 2% (20 MG/ML) 5 ML SYRINGE
INTRAMUSCULAR | Status: DC | PRN
  Administered 2020-12-31: 40 mg via INTRAVENOUS

## 2020-12-31 SURGICAL SUPPLY — 42 items
BAG COUNTER SPONGE SURGICOUNT (BAG) ×2 IMPLANT
BNDG ELASTIC 4X5.8 VLCR STR LF (GAUZE/BANDAGES/DRESSINGS) IMPLANT
BNDG ELASTIC 6X5.8 VLCR STR LF (GAUZE/BANDAGES/DRESSINGS) IMPLANT
BNDG GAUZE ELAST 4 BULKY (GAUZE/BANDAGES/DRESSINGS) IMPLANT
CANISTER SUCT 3000ML PPV (MISCELLANEOUS) ×2 IMPLANT
CLIP LIGATING EXTRA MED SLVR (CLIP) ×2 IMPLANT
CLIP LIGATING EXTRA SM BLUE (MISCELLANEOUS) ×2 IMPLANT
COVER SURGICAL LIGHT HANDLE (MISCELLANEOUS) ×2 IMPLANT
DRAPE EXTREMITY T 121X128X90 (DISPOSABLE) IMPLANT
DRAPE HALF SHEET 40X57 (DRAPES) IMPLANT
DRAPE INCISE IOBAN 66X45 STRL (DRAPES) IMPLANT
DRAPE ORTHO SPLIT 77X108 STRL (DRAPES)
DRAPE SURG ORHT 6 SPLT 77X108 (DRAPES) IMPLANT
DRESSING MEPILEX FLEX 4X4 (GAUZE/BANDAGES/DRESSINGS) ×2 IMPLANT
DRSG ADAPTIC 3X8 NADH LF (GAUZE/BANDAGES/DRESSINGS) IMPLANT
DRSG MEPILEX FLEX 4X4 (GAUZE/BANDAGES/DRESSINGS) ×4
DRSG VAC ATS SM SENSATRAC (GAUZE/BANDAGES/DRESSINGS) ×2 IMPLANT
DRSG VERSA FOAM LRG 10X15 (GAUZE/BANDAGES/DRESSINGS) ×2 IMPLANT
ELECT REM PT RETURN 9FT ADLT (ELECTROSURGICAL) ×2
ELECTRODE REM PT RTRN 9FT ADLT (ELECTROSURGICAL) ×1 IMPLANT
GAUZE SPONGE 4X4 12PLY STRL LF (GAUZE/BANDAGES/DRESSINGS) ×2 IMPLANT
GLOVE SURG ENC MOIS LTX SZ7.5 (GLOVE) ×2 IMPLANT
GOWN STRL REUS W/ TWL LRG LVL3 (GOWN DISPOSABLE) ×2 IMPLANT
GOWN STRL REUS W/ TWL XL LVL3 (GOWN DISPOSABLE) ×1 IMPLANT
GOWN STRL REUS W/TWL LRG LVL3 (GOWN DISPOSABLE) ×4
GOWN STRL REUS W/TWL XL LVL3 (GOWN DISPOSABLE) ×2
KIT BASIN OR (CUSTOM PROCEDURE TRAY) ×2 IMPLANT
KIT TURNOVER KIT B (KITS) ×2 IMPLANT
NS IRRIG 1000ML POUR BTL (IV SOLUTION) ×2 IMPLANT
PACK GENERAL/GYN (CUSTOM PROCEDURE TRAY) IMPLANT
PAD ARMBOARD 7.5X6 YLW CONV (MISCELLANEOUS) ×4 IMPLANT
SPONGE T-LAP 18X18 ~~LOC~~+RFID (SPONGE) ×6 IMPLANT
SUT ETHILON 3 0 PS 1 (SUTURE) IMPLANT
SUT PROLENE 5 0 C 1 36 (SUTURE) ×4 IMPLANT
SUT VIC AB 2-0 CT1 27 (SUTURE)
SUT VIC AB 2-0 CT1 TAPERPNT 27 (SUTURE) IMPLANT
SUT VIC AB 3-0 SH 27 (SUTURE)
SUT VIC AB 3-0 SH 27X BRD (SUTURE) IMPLANT
SUT VICRYL 4-0 PS2 18IN ABS (SUTURE) IMPLANT
TOWEL GREEN STERILE (TOWEL DISPOSABLE) ×4 IMPLANT
TOWEL GREEN STERILE FF (TOWEL DISPOSABLE) ×2 IMPLANT
WATER STERILE IRR 1000ML POUR (IV SOLUTION) ×2 IMPLANT

## 2020-12-31 NOTE — Anesthesia Preprocedure Evaluation (Signed)
Anesthesia Evaluation  Patient identified by MRN, date of birth, ID band Patient awake    Reviewed: Allergy & Precautions, H&P , NPO status , Patient's Chart, lab work & pertinent test results  Airway Mallampati: II   Neck ROM: full    Dental   Pulmonary former smoker,    breath sounds clear to auscultation       Cardiovascular hypertension, +CHF   Rhythm:regular Rate:Normal     Neuro/Psych  Headaches, PSYCHIATRIC DISORDERS Anxiety Depression TIA   GI/Hepatic PUD, GERD  ,  Endo/Other    Renal/GU ESRF and DialysisRenal disease     Musculoskeletal   Abdominal   Peds  Hematology  (+) Blood dyscrasia, anemia , HIV,   Anesthesia Other Findings   Reproductive/Obstetrics                             Anesthesia Physical Anesthesia Plan  ASA: 3  Anesthesia Plan: General   Post-op Pain Management:    Induction: Intravenous  PONV Risk Score and Plan: 3 and Ondansetron, Dexamethasone and Treatment may vary due to age or medical condition  Airway Management Planned: LMA  Additional Equipment:   Intra-op Plan:   Post-operative Plan: Extubation in OR  Informed Consent: I have reviewed the patients History and Physical, chart, labs and discussed the procedure including the risks, benefits and alternatives for the proposed anesthesia with the patient or authorized representative who has indicated his/her understanding and acceptance.     Dental advisory given  Plan Discussed with: CRNA, Anesthesiologist and Surgeon  Anesthesia Plan Comments:         Anesthesia Quick Evaluation

## 2020-12-31 NOTE — Op Note (Signed)
    Patient name: [name] MRN: [id number] DOB: [birth year] Sex: female  12/31/2020 Pre-operative Diagnosis: Right arm wound, end-stage renal disease Post-operative diagnosis:  Same Surgeon:  Eda Paschal. Donzetta Matters, MD Assistant: Leontine Locket, PA Procedure Performed: washout of right arm wound and placement of wound vac  Indications: 70 year old female with end-stage renal disease.  She recently had a right arm fistula ligated has a large arm wound that has been packed with wet-to-dry dressings.  She is now indicated for washout and possible closure.  An assistant was necessary to facilitate exposure expedite the case.  Findings: The oversewn fistula is in the center of the wound.  I elected not to take this back any further as the artery would then be exposed in the wound bed.  There was no closable skin over the wound and so a white sponge wound VAC was placed with a black sponge over top of this.   Procedure:  The patient was identified in the holding area and taken to the operating room where she was placed supine on upper table and LMA anesthesia induced.  She was sterilely prepped draped in the right upper extremity usual fashion, antibiotics were up-to-date timeout was called.  The existing wounds in the arm were washed out.  There was no apparent infection.  I elected that I could not close any tissue overlying the fistula stump.  A white wound VAC sponge was fashioned over the stump and black sponge connecting the 2 wounds over this.  Suction dressing was placed.  She tolerated procedure well without any complication.  All counts were correct at completion  EBL: 25cc   Mayling Aber C. Donzetta Matters, MD [facility] Office: [phone] Pager: [phone]

## 2020-12-31 NOTE — Progress Notes (Signed)
Occupational Therapy Treatment Patient Details Name: [name] MRN: [id number] DOB: 15-Jun-1950 Today's Date: 12/31/2020   History of present illness 70 y.o. female presents to [facility] hospital on 12/24/2020 with RUE fistula infection. Pt underwent temporary HDU catheter placement on 12/26/2020. S/p ligation and debridement 12/27/2020. PMH includes ESRD on HDU TTS, HIV, a flutter.   OT comments  Patient received in bed and agreeable to get out of bed and into recliner. Patient required assistance for bed mobility due to unable to use RUE to assist. Patient was hand held assist to transfer into recliner. Light grooming performed seated in recliner with washing face. Patient required encouragement to use LUE because she didn't believe she would be able to use left hand. Acute OT to continue to follow.    Recommendations for follow up therapy are one component of a multi-disciplinary discharge planning process, led by the attending physician.  Recommendations may be updated based on patient status, additional functional criteria and insurance authorization.    Follow Up Recommendations  Long-term institutional care without follow-up therapy    Assistance Recommended at Discharge Frequent or constant Supervision/Assistance  Equipment Recommendations  Other (comment)    Recommendations for Other Services      Precautions / Restrictions Precautions Precautions: Fall Restrictions Weight Bearing Restrictions: No       Mobility Bed Mobility Overal bed mobility: Needs Assistance Bed Mobility: Supine to Sit     Supine to sit: Mod assist     General bed mobility comments: required assistance with trunk and LEs due to unable to use RUE    Transfers Overall transfer level: Needs assistance Equipment used: 1 person hand held assist Transfers: Sit to/from Stand;Stand Pivot Transfers Sit to Stand: Min assist Stand pivot transfers: Min assist         General transfer comment:  patient appeared fearful during transfers     Balance Overall balance assessment: Needs assistance Sitting-balance support: Feet supported;Single extremity supported Sitting balance-Leahy Scale: Fair Sitting balance - Comments: able to maintain balance with one extremity assisting with balance   Standing balance support: During functional activity;Single extremity supported Standing balance-Leahy Scale: Poor Standing balance comment: stood for transfer                           ADL either performed or assessed with clinical judgement   ADL Overall ADL's : Needs assistance/impaired     Grooming: Wash/dry face;Sitting Grooming Details (indicate cue type and reason): performed sitting in recliner                               General ADL Comments: required encouragement to use LUE to wash face     Vision       Perception     Praxis      Cognition Arousal/Alertness: Awake/alert Behavior During Therapy: WFL for tasks assessed/performed Overall Cognitive Status: No family/caregiver present to determine baseline cognitive functioning                                 General Comments: patient concerned over RUE swelling          Exercises     Shoulder Instructions       General Comments      Pertinent Vitals/ Pain       Pain Assessment: Faces Faces Pain Scale:  Hurts little more Pain Location: RUE Pain Descriptors / Indicators: Aching Pain Intervention(s): Repositioned  Home Living                                          Prior Functioning/Environment              Frequency  Min 2X/week        Progress Toward Goals  OT Goals(current goals can now be found in the care plan section)  Progress towards OT goals: Progressing toward goals  Acute Rehab OT Goals Patient Stated Goal: right arm to get better OT Goal Formulation: With patient Time For Goal Achievement: 01/10/21 Potential to Achieve  Goals: Good ADL Goals Pt Will Perform Eating: with set-up;sitting Pt Will Perform Grooming: sitting;with supervision Pt Will Perform Upper Body Bathing: with min assist;sitting Pt Will Perform Upper Body Dressing: with min assist;sitting Pt Will Transfer to Toilet: with mod assist;squat pivot transfer;bedside commode Pt Will Perform Toileting - Clothing Manipulation and hygiene: with mod assist;sitting/lateral leans  Plan Discharge plan remains appropriate    Co-evaluation                 AM-PAC OT "6 Clicks" Daily Activity     Outcome Measure   Help from another person eating meals?: Total Help from another person taking care of personal grooming?: Total Help from another person toileting, which includes using toliet, bedpan, or urinal?: Total Help from another person bathing (including washing, rinsing, drying)?: Total Help from another person to put on and taking off regular upper body clothing?: Total Help from another person to put on and taking off regular lower body clothing?: Total 6 Click Score: 6    End of Session    OT Visit Diagnosis: Muscle weakness (generalized) (M62.81);Pain Pain - Right/Left: Right Pain - part of body: Arm   Activity Tolerance No increased pain   Patient Left in chair;with call bell/phone within reach;with chair alarm set   Nurse Communication Mobility status        Time: 3734-2876 OT Time Calculation (min): 17 min  Charges: OT General Charges $OT Visit: 1 Visit OT Treatments $Therapeutic Activity: 8-22 mins  Lodema Hong, [place]  Pager [phone] Office [place] 12/31/2020, 10:30 AM

## 2020-12-31 NOTE — Anesthesia Procedure Notes (Signed)
Procedure Name: LMA Insertion Date/Time: 12/31/2020 12:32 PM Performed by: Minerva Ends, CRNA Pre-anesthesia Checklist: Patient identified, Emergency Drugs available, Suction available and Patient being monitored Patient Re-evaluated:Patient Re-evaluated prior to induction Oxygen Delivery Method: Circle system utilized Preoxygenation: Pre-oxygenation with 100% oxygen Induction Type: IV induction Ventilation: Mask ventilation without difficulty LMA: LMA inserted LMA Size: 3.0 Tube type: Oral Number of attempts: 1 Placement Confirmation: positive ETCO2 and breath sounds checked- equal and bilateral Tube secured with: Tape Dental Injury: Teeth and Oropharynx as per pre-operative assessment

## 2020-12-31 NOTE — Progress Notes (Signed)
  Progress Note    12/31/2020 11:36 AM 4 Days Post-Op  Subjective:  no complaints  Vitals:   12/31/20 0426 12/31/20 0933  BP: 138/90 (!) 170/106  Pulse: 62 67  Resp: 18 18  Temp: 98.4 F (36.9 C) 97.7 F (36.5 C)  SpO2: 99% 97%    Physical Exam: Awake and alert Right arm edema improved Dressing on right arm  CBC    Component Value Date/Time   WBC 5.2 12/31/2020 0806   RBC 3.26 (L) 12/31/2020 0806   HGB 10.1 (L) 12/31/2020 0806   HCT 32.4 (L) 12/31/2020 0806   PLT 151 12/31/2020 0806   MCV 99.4 12/31/2020 0806   MCH 31.0 12/31/2020 0806   MCHC 31.2 12/31/2020 0806   RDW 18.8 (H) 12/31/2020 0806   LYMPHSABS 1.1 12/25/2020 0409   MONOABS 0.7 12/25/2020 0409   EOSABS 0.1 12/25/2020 0409   BASOSABS 0.0 12/25/2020 0409    BMET    Component Value Date/Time   NA 133 (L) 12/31/2020 0806   K 3.9 12/31/2020 0806   CL 98 12/31/2020 0806   CO2 22 12/31/2020 0806   GLUCOSE 81 12/31/2020 0806   BUN 43 (H) 12/31/2020 0806   CREATININE 8.25 (H) 12/31/2020 0806   CREATININE 5.66 (H) 12/22/2019 1117   CALCIUM 8.7 (L) 12/31/2020 0806   CALCIUM 9.0 03/12/2011 0527   GFRNONAA 5 (L) 12/31/2020 0806   GFRNONAA 5 (L) 02/12/2015 1000   GFRAA 9 (L) 10/22/2019 0105   GFRAA 5 (L) 02/12/2015 1000    INR    Component Value Date/Time   INR 1.14 12/28/2011 1403     Intake/Output Summary (Last 24 hours) at 12/31/2020 1136 Last data filed at 12/31/2020 0900 Gross per 24 hour  Intake 444.56 ml  Output 0 ml  Net 444.56 ml     Assessment:  70 y.o. female is s/p I+D R arm avf and ligation of avf. Edema much improved.   Plan: OR today for washout and possible closure of right arm wound   Iyad Deroo C. Donzetta Matters, MD [facility] Office: [phone] Pager: [phone]  12/31/2020 11:36 AM

## 2020-12-31 NOTE — Progress Notes (Signed)
[place] for vanc Indication: cath infection  Allergies  Allergen Reactions   Lactose Intolerance (Gi) Other (See Comments)    On MAR   Latex Rash and Itching   Penicillins Rash and Swelling    Has patient had a PCN reaction causing immediate rash, facial/tongue/throat swelling, SOB or lightheadedness with hypotension: No Has patient had a PCN reaction causing severe rash involving mucus membranes or skin necrosis: No Has patient had a PCN reaction that required hospitalization No Has patient had a PCN reaction occurring within the last 10 years: No If all of the above answers are "NO", then may proceed with Cephalosporin use.      Patient Measurements: Height: 5' 2.5" (158.8 cm) Weight: 45.7 kg (100 lb 12 oz) IBW/kg (Calculated) : 51.25 Heparin Dosing Weight: TBW  Vital Signs: Temp: 97.7 F (36.5 C) (11/01 0933) Temp Source: Oral (11/01 0426) BP: 170/106 (11/01 0933) Pulse Rate: 67 (11/01 0933)  Labs: Recent Labs    12/29/20 1022 12/29/20 2044 12/30/20 0503 12/30/20 2217 12/31/20 0806  HGB 10.9*  --  10.0*  --  10.1*  HCT 34.0*  --  32.1*  --  32.4*  PLT 174  --  147*  --  151  APTT 35  --   --   --   --   HEPARINUNFRC 0.11*   < > 0.23* 0.20* 0.40  CREATININE  --   --  6.86*  --  8.25*   < > = values in this interval not displayed.     Estimated Creatinine Clearance: 4.8 mL/min (A) (by C-G formula based on SCr of 8.25 mg/dL (H)).   Assessment: 73 yof admitted with an infected RUE fistula. Apixaban from PTA for Aflutter held on admit, last dose 10/24. She is s/p ligation right arm AV fistula and I&D right arm wound on 10/28. She had Afib with RVR in HD 10/29. Pharmacy consulted to begin heparin drip with no bolus; will also attempt to keep goal range on low end due to low Hgb needing transfusion on 10/28.   Heparin level remains subtherapeutic (0.23) on gtt at 650 units/hr. No issues with line or bleeding reported per RN.  Pt to go to IR today for tunneled catheter placement.  Pt is s/p TDC>>Palindrome conversion. After getting 2 additional dose of vanc while on schedule vanc, random vanc level came back at 82. She will be getting HD today so about half of it will be removed. Will not need vanc again until another HD session is done after today.    Goal of Therapy:  Heparin level 0.3-0.5 units/ml Monitor platelets by anticoagulation protocol: Yes PreHD vanc: 15-25   Plan:   Cont heparin infusion 900 units/hr  F/u heparin plan post [place], PharmD, BCIDP, AAHIVP, CPP Infectious Disease Pharmacist 12/31/2020 9:52 AM

## 2020-12-31 NOTE — Progress Notes (Addendum)
[place] for heparin Indication: atrial fibrillation  Allergies  Allergen Reactions   Lactose Intolerance (Gi) Other (See Comments)    On MAR   Latex Rash and Itching   Penicillins Rash and Swelling    Has patient had a PCN reaction causing immediate rash, facial/tongue/throat swelling, SOB or lightheadedness with hypotension: No Has patient had a PCN reaction causing severe rash involving mucus membranes or skin necrosis: No Has patient had a PCN reaction that required hospitalization No Has patient had a PCN reaction occurring within the last 10 years: No If all of the above answers are "NO", then may proceed with Cephalosporin use.      Patient Measurements: Height: 5' 2.5" (158.8 cm) Weight: 45.7 kg (100 lb 12 oz) IBW/kg (Calculated) : 51.25 Heparin Dosing Weight: TBW  Vital Signs: Temp: 97.7 F (36.5 C) (11/01 0933) Temp Source: Oral (11/01 0426) BP: 170/106 (11/01 0933) Pulse Rate: 67 (11/01 0933)  Labs: Recent Labs    12/29/20 1022 12/29/20 2044 12/30/20 0503 12/30/20 2217 12/31/20 0806  HGB 10.9*  --  10.0*  --  10.1*  HCT 34.0*  --  32.1*  --  32.4*  PLT 174  --  147*  --  151  APTT 35  --   --   --   --   HEPARINUNFRC 0.11*   < > 0.23* 0.20* 0.40  CREATININE  --   --  6.86*  --  8.25*   < > = values in this interval not displayed.     Estimated Creatinine Clearance: 4.8 mL/min (A) (by C-G formula based on SCr of 8.25 mg/dL (H)).   Assessment: 18 yof admitted with an infected RUE fistula. Apixaban from PTA for Aflutter held on admit, last dose 10/24. She is s/p ligation right arm AV fistula and I&D right arm wound on 10/28. She had Afib with RVR in HD 10/29. Pharmacy consulted to begin heparin drip with no bolus; will also attempt to keep goal range on low end due to low Hgb needing transfusion on 10/28.   Pt is s/p TDC>>Palindrome conversion 10/31. Heparin resumed post procedure. Heparin level subtherapeutic  (0.2) on gtt at 800 units/hr. RN moved heparin to upper part of arm and labs being drawn from wrist area - pt is restricted on other side.  Heparin level came back therapeutic this AM at 0.4. Plan for OR today with VVS for infected fistula. We will f/u with plan for heparin post procedure.   Goal of Therapy:  Heparin level 0.3-0.5 units/ml Monitor platelets by anticoagulation protocol: Yes   Plan:   Cont heparin infusion 900 units/hr  F/u post [place], PharmD, BCIDP, AAHIVP, CPP Infectious Disease Pharmacist 12/31/2020 9:44 AM   Addendum  Heparin resumed at 900 units/hr post washout. 8 hr HL  Onnie Boer, PharmD, [place], AAHIVP, CPP Infectious Disease Pharmacist 12/31/2020 4:12 PM

## 2020-12-31 NOTE — Progress Notes (Signed)
Patient ID: [name], female   DOB: [birth year], 70 y.o.   MRN: [id number] S: Patient sitting in chair with no complaints today.  Planning to undergo surgery on her arm.  She received tunneled dialysis catheter with IR today.   O:BP (!) 163/113   Pulse 67   Temp 98.3 F (36.8 C) (Oral)   Resp 20   Ht 5' 2.5" (1.588 m)   Wt 45.7 kg   LMP  (LMP Unknown)   SpO2 98%   BMI 18.13 kg/m   Intake/Output Summary (Last 24 hours) at 12/31/2020 1240 Last data filed at 12/31/2020 0900 Gross per 24 hour  Intake 444.56 ml  Output 0 ml  Net 444.56 ml   Intake/Output: I/O last 3 completed shifts: In: 872.6 [P.O.:420; I.V.:251.8; IV Piggyback:200.8] Out: 0   Intake/Output this shift:  No intake/output data recorded. Weight change:  Gen:NAD CVS: Normal rate, no obvious rub Resp: Bilateral chest rise with no increased work of breathing Abd: +BS,s oft, NT/ND Ext: RUE with dressing from wrist to mid bicep.    Recent Labs  Lab 12/24/20 1518 12/25/20 0409 12/26/20 0508 12/27/20 0958 12/28/20 0735 12/30/20 0503 12/30/20 1313 12/31/20 0806  NA 132* 131* 130* 137 137 136  --  133*  K 5.3* 4.9 6.2* 4.0 4.0 3.4*  --  3.9  CL 92* 91* 93* 98 100 102  --  98  CO2 23 21* 20* 27 25 24   --  22  GLUCOSE 68* 61* 72 89 77 71  --  81  BUN 87* 93* 103* 30* 40* 33*  --  43*  CREATININE 10.98* 11.47* 13.14* 6.26* 7.35* 6.86*  --  8.25*  ALBUMIN 2.9* 2.6*  --   --  2.3*  --   --   --   CALCIUM 8.9 9.0 9.2 9.4 9.3 8.2*  --  8.7*  PHOS  --   --   --   --  7.6*  --  5.0*  --   AST 12* 10*  --   --   --   --   --   --   ALT 9 7  --   --   --   --   --   --    Liver Function Tests: Recent Labs  Lab 12/24/20 1518 12/25/20 0409 12/28/20 0735  AST 12* 10*  --   ALT 9 7  --   ALKPHOS 88 87  --   BILITOT 0.6 0.9  --   PROT 7.8 7.1  --   ALBUMIN 2.9* 2.6* 2.3*   No results for input(s): LIPASE, AMYLASE in the last 168 hours. No results for input(s): AMMONIA in the last 168 hours. CBC: Recent  Labs  Lab 12/24/20 1518 12/25/20 0409 12/26/20 0508 12/27/20 0156 12/27/20 0958 12/28/20 0247 12/29/20 1022 12/30/20 0503 12/31/20 0806  WBC 9.2 8.1   < > 5.9  --  9.8 10.6* 6.5 5.2  NEUTROABS 7.6 6.1  --   --   --   --   --   --   --   HGB 7.8* 7.3*   < > 6.6*   < > 10.9* 10.9* 10.0* 10.1*  HCT 25.7* 23.9*   < > 21.6*   < > 33.0* 34.0* 32.1* 32.4*  MCV 109.8* 107.7*   < > 103.3*  --  96.8 97.7 99.4 99.4  PLT 205 194   < > 164  --  161 174 147* 151   < > =  values in this interval not displayed.   Cardiac Enzymes: No results for input(s): CKTOTAL, CKMB, CKMBINDEX, TROPONINI in the last 168 hours. CBG: Recent Labs  Lab 12/30/20 2057 12/31/20 0146 12/31/20 0427 12/31/20 0746 12/31/20 1111  GLUCAP 95 88 80 85 77    Iron Studies: No results for input(s): IRON, TIBC, TRANSFERRIN, FERRITIN in the last 72 hours. Studies/Results: IR TUNNELED CENTRAL VENOUS CATHETER PLACEMENT  Result Date: 12/30/2020 INDICATION: 70 year old female with a history of end-stage renal disease on dialysis. She has a malfunctioning right upper extremity AV fistula in the setting of right innominate venous stenosis/stent occlusion. She underwent placement of a temporary HD catheter via a left IJ approach on 12/26/2020 and now presents for conversion to a more durable tunneled catheter. EXAM: TUNNELED CENTRAL VENOUS HEMODIALYSIS CATHETER PLACEMENT WITH FLUOROSCOPIC GUIDANCE MEDICATIONS: 1 g vancomycin. The antibiotic was given in an appropriate time interval prior to skin puncture. ANESTHESIA/SEDATION: Moderate (conscious) sedation was employed during this procedure. A total of Versed 1 mg and Fentanyl 25 mcg was administered intravenously. Moderate Sedation Time: 15 minutes. The patient's level of consciousness and vital signs were monitored continuously by radiology nursing throughout the procedure under my direct supervision. FLUOROSCOPY TIME:  Fluoroscopy Time: 0 minutes 18 seconds (0 mGy). COMPLICATIONS: None  immediate. PROCEDURE: Informed written consent was obtained from the patient after a discussion of the risks, benefits, and alternatives to treatment. Questions regarding the procedure were encouraged and answered. The left neck and chest were prepped with chlorhexidine in a sterile fashion, and a sterile drape was applied covering the operative field. Maximum barrier sterile technique with sterile gowns and gloves were used for the procedure. A timeout was performed prior to the initiation of the procedure. A stiff Glidewire was advanced through the existing temporary hemodialysis catheter to the level of the. A suitable skin exit site was selected inferior to the clavicle. Local anesthesia was attained by infiltration with 1% lidocaine. A small dermatotomy was made. A Palindrome tunneled hemodialysis catheter measuring 23 cm from tip to cuff was tunneled in a retrograde fashion from the anterior chest wall to the venotomy incision. The existing non tunneled hemodialysis catheter was then removed over the stiff Glidewire. A peel-away sheath was advanced over the Glidewire and positioned in the SVC. The new tunneled catheter was then placed through the peel-away sheath with tips ultimately positioned within the superior aspect of the right atrium. Final catheter positioning was confirmed and documented with a spot radiographic image. The catheter aspirates and flushes normally. The catheter was flushed with appropriate volume heparin dwells. The catheter exit site was secured with a 0-Silk retention sutures. The venotomy incision was closed with Dermabond. Dressings were applied. The patient tolerated the procedure well without immediate post procedural complication. IMPRESSION: Successful conversion to a new 23 cm tip to cuff palindrome tunneled hemodialysis catheter via the left internal jugular vein with tips terminating within the superior aspect of the right atrium. The catheter is ready for immediate use.  Electronically Signed   By: Jacqulynn Cadet M.D.   On: 12/30/2020 12:32    [MAR Hold] calcitRIOL  1.25 mcg Oral Q T,Th,Sa-HD   [MAR Hold] Chlorhexidine Gluconate Cloth  6 each Topical Q0600   [MAR Hold] cinacalcet  60 mg Oral Q T,Th,Sa-HD   [MAR Hold] darbepoetin (ARANESP) injection - DIALYSIS  200 mcg Intravenous Q Thu-HD   [MAR Hold] dolutegravir  50 mg Oral q1600   [MAR Hold] fentaNYL  1 patch Transdermal Q72H   [MAR Hold]  lamiVUDine  50 mg Oral Daily   [MAR Hold] methadone  2.5 mg Oral Q2000   [MAR Hold] metoprolol tartrate  50 mg Oral BID   [MAR Hold] mirtazapine  15 mg Oral QHS   [MAR Hold] multivitamin  1 tablet Oral QHS   [MAR Hold] pantoprazole  40 mg Oral QAC breakfast   [MAR Hold] vancomycin variable dose per unstable renal function (pharmacist dosing)   Does not apply See admin instructions   [MAR Hold] venlafaxine XR  150 mg Oral Q breakfast   [MAR Hold] zidovudine  300 mg Oral Daily    BMET    Component Value Date/Time   NA 133 (L) 12/31/2020 0806   K 3.9 12/31/2020 0806   CL 98 12/31/2020 0806   CO2 22 12/31/2020 0806   GLUCOSE 81 12/31/2020 0806   BUN 43 (H) 12/31/2020 0806   CREATININE 8.25 (H) 12/31/2020 0806   CREATININE 5.66 (H) 12/22/2019 1117   CALCIUM 8.7 (L) 12/31/2020 0806   CALCIUM 9.0 03/12/2011 0527   GFRNONAA 5 (L) 12/31/2020 0806   GFRNONAA 5 (L) 02/12/2015 1000   GFRAA 9 (L) 10/22/2019 0105   GFRAA 5 (L) 02/12/2015 1000   CBC    Component Value Date/Time   WBC 5.2 12/31/2020 0806   RBC 3.26 (L) 12/31/2020 0806   HGB 10.1 (L) 12/31/2020 0806   HCT 32.4 (L) 12/31/2020 0806   PLT 151 12/31/2020 0806   MCV 99.4 12/31/2020 0806   MCH 31.0 12/31/2020 0806   MCHC 31.2 12/31/2020 0806   RDW 18.8 (H) 12/31/2020 0806   LYMPHSABS 1.1 12/25/2020 0409   MONOABS 0.7 12/25/2020 0409   EOSABS 0.1 12/25/2020 0409   BASOSABS 0.0 12/25/2020 0409    Dialysis Orders: Center: [facility]  on TTS . EDW 41.5kg HD Bath 2K/2.5Ca  Time 3 hours  Heparin 1000 units bolus then 500 units/hr. 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  appreciate vascular surgery evaluation-  s/p ligation and debridement 10/28.  Going back to the OR today for possible closure.  On vanc. MRSA on culture  ESRD -continue dialysis TTS schedule.  If delayed to tomorrow because of surgery that is okay  Hypertension/volume  - stable- no BP meds-  Uf as able-  has prespecified max of 3.5 liters  Anemia  - Hb 10s. continue with ESA.    Metabolic bone disease -  continue with calcitriol and sensipar-phosphorus level  Nutrition - renal diet. Hypoglycemia - PRN D50  AFib/FL:  improved with increased metoprolol dose HIV on meds   [name]

## 2020-12-31 NOTE — Transfer of Care (Signed)
Immediate Anesthesia Transfer of Care Note  Patient: [name]  Procedure(s) Performed: INCISION AND DRAINAGE RIGHT ARM WITH PLACEMNENT OF WOUND VAC (Right: Arm Upper)  Patient Location: PACU  Anesthesia Type:General  Level of Consciousness: awake, alert  and oriented  Airway & Oxygen Therapy: Patient Spontanous Breathing  Post-op Assessment: Report given to RN and Post -op Vital signs reviewed and stable  Post vital signs: Reviewed and stable  Last Vitals:  Vitals Value Taken Time  BP 166/109 12/31/20 1320  Temp    Pulse 65 12/31/20 1323  Resp 17 12/31/20 1323  SpO2 100 % 12/31/20 1323  Vitals shown include unvalidated device data.  Last Pain:  Vitals:   12/31/20 1143  TempSrc:   PainSc: 8       Patients Stated Pain Goal: 2 (44/61/90 1222)  Complications: No notable events documented.

## 2020-12-31 NOTE — Anesthesia Postprocedure Evaluation (Signed)
Anesthesia Post Note  Patient: [name]  Procedure(s) Performed: INCISION AND DRAINAGE RIGHT ARM WITH PLACEMNENT OF WOUND VAC (Right: Arm Upper)     Patient location during evaluation: PACU Anesthesia Type: General Level of consciousness: awake and alert Pain management: pain level controlled Vital Signs Assessment: post-procedure vital signs reviewed and stable Respiratory status: spontaneous breathing, nonlabored ventilation, respiratory function stable and patient connected to nasal cannula oxygen Cardiovascular status: blood pressure returned to baseline and stable Postop Assessment: no apparent nausea or vomiting Anesthetic complications: no   No notable events documented.  Last Vitals:  Vitals:   12/31/20 1421 12/31/20 1611  BP: (!) 160/102 (!) 158/100  Pulse: (!) 59 61  Resp: 15 16  Temp: 36.4 C 36.5 C  SpO2: 98% 99%    Last Pain:  Vitals:   12/31/20 1611  TempSrc: Oral  PainSc:                  [place] S

## 2020-12-31 NOTE — Progress Notes (Signed)
PROGRESS NOTE    [name]  WUJ:[id number] DOB: [birth year] DOA: 12/24/2020 PCP: Hilbert Corrigan, MD   Brief Narrative:  70 year old with history of ESRD on HD Tuesday Thursday Saturday, HIV, a flutter on Eliquis comes to the hospital with complaints of right upper extremity AV fistula infection.  She had an AV fistula occlusion about 4 weeks ago and underwent balloon angioplasty of the right subclavian and right innominate vein with conversion to right forearm fistula to right upper extremity fistula.  She initially went to [facility] where she was diagnosed with AV fistula infection and transferred to [facility].  Vascular, nephro, IR and infectious disease are consulted.  Patient underwent ligation of AV fistula and I&D on 12/27/2020.  I have placed a tunneled catheter 10/31.  Vascular planning washout today with arm closure.   Assessment & Plan:   Active Problems:   Human immunodeficiency virus (HIV) disease ([place])   Essential hypertension   ESRD on dialysis [facility])   Atrial flutter ([place])   Anemia of chronic disease   Infected fluid collection with fistula   Hypoglycemia   Right AV fistula infection now with purulent discharge -On vancomycin Adjustment per ID depending on culture data - Vascular performed I&D/ligation of AV fistula on 12/19/2020.  Dressing management per their service.  Repeat OR visit today by vascular for washout and wound closure.  -Infectious disease following, Wound Cx shows MRSA - Pain control, dry dressing.  ESRD on hemodialysis TTS -Temporary HD removed and tunneled cath placed on 10/31.  HD per nephro  Hyperkalemia -Resolved.    Atrial flutter/fibrillation.  Rate better controlled -Continue metoprolol to 50mg  BID  Chronically on Eliquis, currently on heparin drip  History of HIV - CD4 count 252, continue antiretrovirals  Anemia of chronic disease - Baseline hemoglobin 8.0.  Continue to monitor  History of chronic  pain syndrome - On fentanyl patch and methadone  DVT prophylaxis: Hep Drip Code Status: Full Family Communication:  None  Status is: Inpatient  Remains inpatient appropriate because: Ongoing treatment for infected AV fistula with IV antibiotics.  Maintain hospital stay until cleared by infectious disease   Nutritional status  Nutrition Problem: Severe Malnutrition Etiology: chronic illness (ESRD on HD)  Signs/Symptoms: severe fat depletion, severe muscle depletion  Interventions: Snacks, MVI, Other (Comment) (double protein)  Body mass index is 18.13 kg/m.       Subjective: Sitting up in the chair, does not have any complaints this morning.  Awaiting her OR visit today.  Review of systems: General: Denies fever, chills, night sweats or unintended weight loss. Resp: Denies cough, wheezing, shortness of breath. Cardiac: Denies chest pain, palpitations, orthopnea, paroxysmal nocturnal dyspnea. GI: Denies abdominal pain, nausea, vomiting, diarrhea or constipation GU: Denies dysuria, frequency, hesitancy or incontinence MS: Denies muscle aches, joint pain or swelling Neuro: Denies headache, neurologic deficits (focal weakness, numbness, tingling), abnormal gait Psych: Denies anxiety, depression, SI/HI/AVH Skin: Denies new rashes or lesions ID: Denies sick contacts, exotic exposures, travel   Examination:  Constitutional: Not in acute distress Respiratory: Clear to auscultation bilaterally Cardiovascular: Normal sinus rhythm, no rubs Abdomen: Nontender nondistended good bowel sounds Musculoskeletal: No edema noted Skin: Right upper extremity swelling significantly reduced, dressing is in place. Neurologic: CN 2-12 grossly intact.  And nonfocal Psychiatric: Normal judgment and insight. Alert and oriented x 3. Normal mood. Objective: Vitals:   12/30/20 2057 12/31/20 0426 12/31/20 0933 12/31/20 1137  BP: (!) 156/84 138/90 (!) 170/106 (!) 163/113  Pulse: 68  62 67 67   Resp: 18 18 18 20   Temp: 99 F (37.2 C) 98.4 F (36.9 C) 97.7 F (36.5 C) 98.3 F (36.8 C)  TempSrc: Oral Oral  Oral  SpO2: 100% 99% 97% 98%  Weight:    45.7 kg  Height:    5' 2.5" (1.588 m)    Intake/Output Summary (Last 24 hours) at 12/31/2020 1220 Last data filed at 12/31/2020 0900 Gross per 24 hour  Intake 444.56 ml  Output 0 ml  Net 444.56 ml   Filed Weights   12/28/20 0853 12/28/20 1149 12/31/20 1137  Weight: 47 kg 45.7 kg 45.7 kg     Data Reviewed:   CBC: Recent Labs  Lab 12/24/20 1518 12/25/20 0409 12/26/20 0508 12/27/20 0156 12/27/20 0958 12/28/20 0247 12/29/20 1022 12/30/20 0503 12/31/20 0806  WBC 9.2 8.1   < > 5.9  --  9.8 10.6* 6.5 5.2  NEUTROABS 7.6 6.1  --   --   --   --   --   --   --   HGB 7.8* 7.3*   < > 6.6* 8.2* 10.9* 10.9* 10.0* 10.1*  HCT 25.7* 23.9*   < > 21.6* 26.7* 33.0* 34.0* 32.1* 32.4*  MCV 109.8* 107.7*   < > 103.3*  --  96.8 97.7 99.4 99.4  PLT 205 194   < > 164  --  161 174 147* 151   < > = values in this interval not displayed.   Basic Metabolic Panel: Recent Labs  Lab 12/26/20 0508 12/27/20 0156 12/27/20 0958 12/28/20 0247 12/28/20 0735 12/29/20 1022 12/30/20 0503 12/30/20 1313 12/31/20 0806  NA 130*  --  137  --  137  --  136  --  133*  K 6.2*  --  4.0  --  4.0  --  3.4*  --  3.9  CL 93*  --  98  --  100  --  102  --  98  CO2 20*  --  27  --  25  --  24  --  22  GLUCOSE 72  --  89  --  77  --  71  --  81  BUN 103*  --  30*  --  40*  --  33*  --  43*  CREATININE 13.14*  --  6.26*  --  7.35*  --  6.86*  --  8.25*  CALCIUM 9.2  --  9.4  --  9.3  --  8.2*  --  8.7*  MG  --  2.0  --  2.2  --  2.0 2.0  --  2.1  PHOS  --   --   --   --  7.6*  --   --  5.0*  --    GFR: Estimated Creatinine Clearance: 4.8 mL/min (A) (by C-G formula based on SCr of 8.25 mg/dL (H)). Liver Function Tests: Recent Labs  Lab 12/24/20 1518 12/25/20 0409 12/28/20 0735  AST 12* 10*  --   ALT 9 7  --   ALKPHOS 88 87  --   BILITOT 0.6 0.9   --   PROT 7.8 7.1  --   ALBUMIN 2.9* 2.6* 2.3*   No results for input(s): LIPASE, AMYLASE in the last 168 hours. No results for input(s): AMMONIA in the last 168 hours. Coagulation Profile: No results for input(s): INR, PROTIME in the last 168 hours. Cardiac Enzymes: No results for input(s): CKTOTAL, CKMB, CKMBINDEX, TROPONINI in the last 168 hours. BNP (  last 3 results) No results for input(s): PROBNP in the last 8760 hours. HbA1C: No results for input(s): HGBA1C in the last 72 hours. CBG: Recent Labs  Lab 12/30/20 2057 12/31/20 0146 12/31/20 0427 12/31/20 0746 12/31/20 1111  GLUCAP 95 88 80 85 77   Lipid Profile: No results for input(s): CHOL, HDL, LDLCALC, TRIG, CHOLHDL, LDLDIRECT in the last 72 hours. Thyroid Function Tests: No results for input(s): TSH, T4TOTAL, FREET4, T3FREE, THYROIDAB in the last 72 hours. Anemia Panel: No results for input(s): VITAMINB12, FOLATE, FERRITIN, TIBC, IRON, RETICCTPCT in the last 72 hours. Sepsis Labs: Recent Labs  Lab 12/24/20 1518 12/24/20 1658  LATICACIDVEN 1.6 1.0    Recent Results (from the past 240 hour(s))  Resp Panel by RT-PCR (Flu A&B, Covid) Nasopharyngeal Swab     Status: None   Collection Time: 12/24/20  2:53 PM   Specimen: Nasopharyngeal Swab; Nasopharyngeal(NP) swabs in vial transport medium  Result Value Ref Range Status   SARS Coronavirus 2 by RT PCR NEGATIVE NEGATIVE Final    Comment: (NOTE) SARS-CoV-2 target nucleic acids are NOT DETECTED.  The SARS-CoV-2 RNA is generally detectable in upper respiratory specimens during the acute phase of infection. The lowest concentration of SARS-CoV-2 viral copies this assay can detect is 138 copies/mL. A negative result does not preclude SARS-Cov-2 infection and should not be used as the sole basis for treatment or other patient management decisions. A negative result may occur with  improper specimen collection/handling, submission of specimen other than nasopharyngeal  swab, presence of viral mutation(s) within the areas targeted by this assay, and inadequate number of viral copies(<138 copies/mL). A negative result must be combined with clinical observations, patient history, and epidemiological information. The expected result is Negative.  Fact Sheet for Patients:  EntrepreneurPulse.com.au  Fact Sheet for Healthcare Providers:  IncredibleEmployment.be  This test is no t yet approved or cleared by the [place] FDA and  has been authorized for detection and/or diagnosis of SARS-CoV-2 by FDA under an Emergency Use Authorization (EUA). This EUA will remain  in effect (meaning this test can be used) for the duration of the COVID-19 declaration under Section 564(b)(1) of the Act, 21 U.S.C.section 360bbb-3(b)(1), unless the authorization is terminated  or revoked sooner.       Influenza A by PCR NEGATIVE NEGATIVE Final   Influenza B by PCR NEGATIVE NEGATIVE Final    Comment: (NOTE) The Xpert Xpress SARS-CoV-2/FLU/RSV plus assay is intended as an aid in the diagnosis of influenza from Nasopharyngeal swab specimens and should not be used as a sole basis for treatment. Nasal washings and aspirates are unacceptable for Xpert Xpress SARS-CoV-2/FLU/RSV testing.  Fact Sheet for Patients: EntrepreneurPulse.com.au  Fact Sheet for Healthcare Providers: IncredibleEmployment.be  This test is not yet approved or cleared by the [place] FDA and has been authorized for detection and/or diagnosis of SARS-CoV-2 by FDA under an Emergency Use Authorization (EUA). This EUA will remain in effect (meaning this test can be used) for the duration of the COVID-19 declaration under Section 564(b)(1) of the Act, 21 U.S.C. section 360bbb-3(b)(1), unless the authorization is terminated or revoked.  Performed at [facility], [address] [place], [place] [address]   Blood culture  (routine x 2)     Status: None   Collection Time: 12/24/20  3:19 PM   Specimen: BLOOD LEFT HAND  Result Value Ref Range Status   Specimen Description BLOOD LEFT HAND  Final   Special Requests   Final    BOTTLES  DRAWN AEROBIC AND ANAEROBIC Blood Culture adequate volume   Culture   Final    NO GROWTH 5 DAYS Performed at [facility], [address], [place], [place] [address]    Report Status 12/29/2020 FINAL  Final  Blood culture (routine x 2)     Status: None   Collection Time: 12/24/20  3:19 PM   Specimen: BLOOD LEFT ARM  Result Value Ref Range Status   Specimen Description BLOOD LEFT ARM  Final   Special Requests   Final    BOTTLES DRAWN AEROBIC AND ANAEROBIC Blood Culture adequate volume   Culture   Final    NO GROWTH 5 DAYS Performed at [facility], [address] [place], [place] [address]    Report Status 12/29/2020 FINAL  Final  Surgical pcr screen     Status: None   Collection Time: 12/26/20 12:42 PM   Specimen: Nasal Mucosa; Nasal Swab  Result Value Ref Range Status   MRSA, PCR NEGATIVE NEGATIVE Final   Staphylococcus aureus NEGATIVE NEGATIVE Final    Comment: (NOTE) The Xpert SA Assay (FDA approved for NASAL specimens in patients 56 years of age and older), is one component of a comprehensive surveillance program. It is not intended to diagnose infection nor to guide or monitor treatment. Performed at [facility], [place] [address] [place], [place] [address]   Aerobic Culture w Gram Stain (superficial specimen)     Status: None   Collection Time: 12/27/20 12:19 PM   Specimen: Abscess; Wound  Result Value Ref Range Status   Specimen Description ABSCESS  Final   Special Requests ARM RIGHT  Final   Gram Stain   Final    FEW WBC PRESENT,BOTH PMN AND MONONUCLEAR FEW GRAM POSITIVE COCCI IN PAIRS Performed at [facility], [address] [place], [place] [address]    Culture   Final    MODERATE METHICILLIN RESISTANT STAPHYLOCOCCUS AUREUS   Report  Status 12/29/2020 FINAL  Final   Organism ID, Bacteria METHICILLIN RESISTANT STAPHYLOCOCCUS AUREUS  Final      Susceptibility   Methicillin resistant staphylococcus aureus - MIC*    CIPROFLOXACIN >=8 RESISTANT Resistant     ERYTHROMYCIN >=8 RESISTANT Resistant     GENTAMICIN <=0.5 SENSITIVE Sensitive     OXACILLIN >=4 RESISTANT Resistant     TETRACYCLINE <=1 SENSITIVE Sensitive     VANCOMYCIN 1 SENSITIVE Sensitive     TRIMETH/SULFA <=10 SENSITIVE Sensitive     CLINDAMYCIN <=0.25 SENSITIVE Sensitive     RIFAMPIN <=0.5 SENSITIVE Sensitive     Inducible Clindamycin NEGATIVE Sensitive     * MODERATE METHICILLIN RESISTANT STAPHYLOCOCCUS AUREUS  Aerobic/Anaerobic Culture w Gram Stain (surgical/deep wound)     Status: None (Preliminary result)   Collection Time: 12/27/20  2:39 PM   Specimen: Wound; Tissue  Result Value Ref Range Status   Specimen Description TISSUE  Final   Special Requests RIGHT ARM WOUND FOR CULTURES SPEC A  Final   Gram Stain   Final    RARE WBC PRESENT, PREDOMINANTLY PMN RARE GRAM POSITIVE COCCI IN PAIRS Performed at [facility], [place] [address] [place], [place] [address]    Culture   Final    RARE METHICILLIN RESISTANT STAPHYLOCOCCUS AUREUS NO ANAEROBES ISOLATED; CULTURE IN PROGRESS FOR 5 DAYS    Report Status PENDING  Incomplete   Organism ID, Bacteria METHICILLIN RESISTANT STAPHYLOCOCCUS AUREUS  Final      Susceptibility   Methicillin resistant staphylococcus aureus -  MIC*    CIPROFLOXACIN >=8 RESISTANT Resistant     ERYTHROMYCIN >=8 RESISTANT Resistant     GENTAMICIN <=0.5 SENSITIVE Sensitive     OXACILLIN >=4 RESISTANT Resistant     TETRACYCLINE <=1 SENSITIVE Sensitive     VANCOMYCIN 1 SENSITIVE Sensitive     TRIMETH/SULFA <=10 SENSITIVE Sensitive     CLINDAMYCIN <=0.25 SENSITIVE Sensitive     RIFAMPIN <=0.5 SENSITIVE Sensitive     Inducible Clindamycin NEGATIVE Sensitive     * RARE METHICILLIN RESISTANT STAPHYLOCOCCUS AUREUS          Radiology Studies: IR TUNNELED CENTRAL VENOUS CATHETER PLACEMENT  Result Date: 12/30/2020 INDICATION: 70 year old female with a history of end-stage renal disease on dialysis. She has a malfunctioning right upper extremity AV fistula in the setting of right innominate venous stenosis/stent occlusion. She underwent placement of a temporary HD catheter via a left IJ approach on 12/26/2020 and now presents for conversion to a more durable tunneled catheter. EXAM: TUNNELED CENTRAL VENOUS HEMODIALYSIS CATHETER PLACEMENT WITH FLUOROSCOPIC GUIDANCE MEDICATIONS: 1 g vancomycin. The antibiotic was given in an appropriate time interval prior to skin puncture. ANESTHESIA/SEDATION: Moderate (conscious) sedation was employed during this procedure. A total of Versed 1 mg and Fentanyl 25 mcg was administered intravenously. Moderate Sedation Time: 15 minutes. The patient's level of consciousness and vital signs were monitored continuously by radiology nursing throughout the procedure under my direct supervision. FLUOROSCOPY TIME:  Fluoroscopy Time: 0 minutes 18 seconds (0 mGy). COMPLICATIONS: None immediate. PROCEDURE: Informed written consent was obtained from the patient after a discussion of the risks, benefits, and alternatives to treatment. Questions regarding the procedure were encouraged and answered. The left neck and chest were prepped with chlorhexidine in a sterile fashion, and a sterile drape was applied covering the operative field. Maximum barrier sterile technique with sterile gowns and gloves were used for the procedure. A timeout was performed prior to the initiation of the procedure. A stiff Glidewire was advanced through the existing temporary hemodialysis catheter to the level of the. A suitable skin exit site was selected inferior to the clavicle. Local anesthesia was attained by infiltration with 1% lidocaine. A small dermatotomy was made. A Palindrome tunneled hemodialysis catheter measuring  23 cm from tip to cuff was tunneled in a retrograde fashion from the anterior chest wall to the venotomy incision. The existing non tunneled hemodialysis catheter was then removed over the stiff Glidewire. A peel-away sheath was advanced over the Glidewire and positioned in the SVC. The new tunneled catheter was then placed through the peel-away sheath with tips ultimately positioned within the superior aspect of the right atrium. Final catheter positioning was confirmed and documented with a spot radiographic image. The catheter aspirates and flushes normally. The catheter was flushed with appropriate volume heparin dwells. The catheter exit site was secured with a 0-Silk retention sutures. The venotomy incision was closed with Dermabond. Dressings were applied. The patient tolerated the procedure well without immediate post procedural complication. IMPRESSION: Successful conversion to a new 23 cm tip to cuff palindrome tunneled hemodialysis catheter via the left internal jugular vein with tips terminating within the superior aspect of the right atrium. The catheter is ready for immediate use. Electronically Signed   By: Jacqulynn Cadet M.D.   On: 12/30/2020 12:32        Scheduled Meds:  [MAR Hold] calcitRIOL  1.25 mcg Oral Q T,Th,Sa-HD   [MAR Hold] Chlorhexidine Gluconate Cloth  6 each Topical Q0600   [MAR Hold] cinacalcet  60  mg Oral Q T,Th,Sa-HD   [MAR Hold] darbepoetin (ARANESP) injection - DIALYSIS  200 mcg Intravenous Q Thu-HD   [MAR Hold] dolutegravir  50 mg Oral q1600   [MAR Hold] fentaNYL  1 patch Transdermal Q72H   [MAR Hold] lamiVUDine  50 mg Oral Daily   [MAR Hold] methadone  2.5 mg Oral Q2000   [MAR Hold] metoprolol tartrate  50 mg Oral BID   [MAR Hold] mirtazapine  15 mg Oral QHS   [MAR Hold] multivitamin  1 tablet Oral QHS   [MAR Hold] pantoprazole  40 mg Oral QAC breakfast   [MAR Hold] vancomycin variable dose per unstable renal function (pharmacist dosing)   Does not apply See  admin instructions   [MAR Hold] venlafaxine XR  150 mg Oral Q breakfast   [MAR Hold] zidovudine  300 mg Oral Daily   Continuous Infusions:  heparin Stopped (12/31/20 1121)   lactated ringers 10 mL/hr at 12/31/20 1150     LOS: 7 days   Time spent= 35 mins    [place] Skalicky Arsenio Loader, MD [facility]  If 7PM-7AM, please contact night-coverage  12/31/2020, 12:20 PM

## 2021-01-01 ENCOUNTER — Encounter (HOSPITAL_COMMUNITY): Payer: Self-pay | Admitting: Vascular Surgery

## 2021-01-01 DIAGNOSIS — I10 Essential (primary) hypertension: Secondary | ICD-10-CM | POA: Diagnosis not present

## 2021-01-01 DIAGNOSIS — N186 End stage renal disease: Secondary | ICD-10-CM

## 2021-01-01 DIAGNOSIS — T827XXA Infection and inflammatory reaction due to other cardiac and vascular devices, implants and grafts, initial encounter: Secondary | ICD-10-CM

## 2021-01-01 DIAGNOSIS — B20 Human immunodeficiency virus [HIV] disease: Secondary | ICD-10-CM | POA: Diagnosis not present

## 2021-01-01 DIAGNOSIS — Z9889 Other specified postprocedural states: Secondary | ICD-10-CM

## 2021-01-01 DIAGNOSIS — L089 Local infection of the skin and subcutaneous tissue, unspecified: Secondary | ICD-10-CM | POA: Diagnosis not present

## 2021-01-01 DIAGNOSIS — Z992 Dependence on renal dialysis: Secondary | ICD-10-CM | POA: Diagnosis not present

## 2021-01-01 LAB — AEROBIC/ANAEROBIC CULTURE W GRAM STAIN (SURGICAL/DEEP WOUND)

## 2021-01-01 LAB — CBC
HCT: 32.2 % — ABNORMAL LOW (ref 36.0–46.0)
Hemoglobin: 10.2 g/dL — ABNORMAL LOW (ref 12.0–15.0)
MCH: 31.4 pg (ref 26.0–34.0)
MCHC: 31.7 g/dL (ref 30.0–36.0)
MCV: 99.1 fL (ref 80.0–100.0)
Platelets: 179 10*3/uL (ref 150–400)
RBC: 3.25 MIL/uL — ABNORMAL LOW (ref 3.87–5.11)
RDW: 18.3 % — ABNORMAL HIGH (ref 11.5–15.5)
WBC: 7.3 10*3/uL (ref 4.0–10.5)
nRBC: 0 % (ref 0.0–0.2)

## 2021-01-01 LAB — GLUCOSE, CAPILLARY
Glucose-Capillary: 128 mg/dL — ABNORMAL HIGH (ref 70–99)
Glucose-Capillary: 75 mg/dL (ref 70–99)
Glucose-Capillary: 94 mg/dL (ref 70–99)
Glucose-Capillary: 95 mg/dL (ref 70–99)

## 2021-01-01 LAB — BASIC METABOLIC PANEL
Anion gap: 13 (ref 5–15)
BUN: 51 mg/dL — ABNORMAL HIGH (ref 8–23)
CO2: 22 mmol/L (ref 22–32)
Calcium: 8.5 mg/dL — ABNORMAL LOW (ref 8.9–10.3)
Chloride: 98 mmol/L (ref 98–111)
Creatinine, Ser: 9.17 mg/dL — ABNORMAL HIGH (ref 0.44–1.00)
GFR, Estimated: 4 mL/min — ABNORMAL LOW (ref >60)
Glucose, Bld: 134 mg/dL — ABNORMAL HIGH (ref 70–99)
Potassium: 4.8 mmol/L (ref 3.5–5.1)
Sodium: 133 mmol/L — ABNORMAL LOW (ref 135–145)

## 2021-01-01 LAB — HEPARIN LEVEL (UNFRACTIONATED): Heparin Unfractionated: 0.43 IU/mL (ref 0.30–0.70)

## 2021-01-01 LAB — MAGNESIUM: Magnesium: 2.2 mg/dL (ref 1.7–2.4)

## 2021-01-01 LAB — HEPATITIS B CORE ANTIBODY, TOTAL: Hep B Core Total Ab: NONREACTIVE

## 2021-01-01 MED ORDER — APIXABAN 2.5 MG PO TABS
2.5000 mg | ORAL_TABLET | Freq: Two times a day (BID) | ORAL | Status: DC
  Administered 2021-01-01 – 2021-01-09 (×17): 2.5 mg via ORAL
  Filled 2021-01-01 (×19): qty 1

## 2021-01-01 MED ORDER — HEPARIN SODIUM (PORCINE) 1000 UNIT/ML IJ SOLN
INTRAMUSCULAR | Status: AC
  Filled 2021-01-01: qty 4

## 2021-01-01 NOTE — Progress Notes (Signed)
Nutrition Follow-up  DOCUMENTATION CODES:   Severe malnutrition in context of chronic illness  INTERVENTION:  Recommend placement of Cortrak/small bore NGT to initiate nutrition support given malnutrition and ongoing inadequate PO intake:  -Consider Osmolite 1.2 @ 45m/hr advance 116mhr Q4H until goal rate of 5566mr is reached (1320m102m  At goal, TF would provide 1584 kcals, 73 grams protein, 1082ml22me water  Pt would likely benefit from PEG placement.   -recommend snacks BID -continue renal mvi daily  NUTRITION DIAGNOSIS:   Severe Malnutrition related to chronic illness (ESRD on HD) as evidenced by severe fat depletion, severe muscle depletion.  ongoing  GOAL:   Patient will meet greater than or equal to 90% of their needs  Not met  MONITOR:   Diet advancement, PO intake, I & O's, Labs, Weight trends  REASON FOR ASSESSMENT:   Malnutrition Screening Tool    ASSESSMENT:   Patient with PMH significant for chronic diarrhea, ESRD on HD, GERD, HIV, HTN, pulmonary HTN, anxiety/depression, Atrial flutter, and traumatic hematoma of R upper arm s/p recent balloon angioplasty of R subclavian and R innominate veins w/ conversion of R forearm fistula to R upper arm fistula Sept [facility]22 [facility] c/o progressively worsening R arm pain and was admitted with R AVF infection. [facility]  Pt continues to have inadequate oral intake, 0-25% meal completion. Pt does not like oral nutrition supplements. Recommend pt have Cortrak placed to help meet nutrition needs with ultimate recommendation to have PEG placed given pt is severely malnourished with ongoing inadequate intake and elevated nutritional needs 2/2 dialysis. Discussed with MD.   Pt had HD today with 1033ml 29mUF Post HD wt 45.3 kg EDW 41.6 kg  No UOP documented x24 hours I/O: +1161ml s46m admit  Medications: rocaltrol, sensipar, aranesp, remeron,  rena-vit, protonix, abx Labs: Na 133 (L) CBGs [phone]:   Diet Order             DIET SOFT Room service appropriate? Yes; Fluid consistency: Thin  Diet effective now                   EDUCATION NEEDS:   No education needs have been identified at this time  Skin:  Skin Assessment: Skin Integrity Issues: Skin Integrity Issues:: Incisions Incisions: R arm  Last BM:  10/30  Height:   Ht Readings from Last 1 Encounters:  12/31/20 5' 2.5" (1.588 m)    Weight:   Wt Readings from Last 1 Encounters:  01/01/21 45.3 kg    BMI:  Body mass index is 17.98 kg/m.  Estimated Nutritional Needs:   Kcal:  1450-1650  Protein:  70-85 grams  Fluid:  1L+UOP    Kristofor Michalowski Lark[place] InaD, LDN (she/her/hers) RD pager number and weekend/on-call pager number located in [email or web address]

## 2021-01-01 NOTE — Progress Notes (Addendum)
PROGRESS NOTE    [name]  TUU:[id number] DOB: 1951/01/30 DOA: 12/24/2020 PCP: Hilbert Corrigan, MD   Brief Narrative:  70 year old with history of ESRD on HD Tuesday Thursday Saturday, HIV, a flutter on Eliquis comes to the hospital with complaints of right upper extremity AV fistula infection.  She had an AV fistula occlusion about 4 weeks ago and underwent balloon angioplasty of the right subclavian and right innominate vein with conversion to right forearm fistula to right upper extremity fistula.  She initially went to [facility] where she was diagnosed with AV fistula infection and transferred to [facility]  Vascular, nephro, IR and infectious disease are consulted.  Patient underwent ligation of AV fistula and I&D on 12/27/2020.  I have placed a tunneled catheter 10/31.  Patient returned to the OR by vascular 11/1 for washout and wound VAC placement.   Assessment & Plan:   Active Problems:   Human immunodeficiency virus (HIV) disease ([place])   Essential hypertension   ESRD on dialysis [facility])   Atrial flutter ([place])   Anemia of chronic disease   Infected fluid collection with fistula   Hypoglycemia   Right AV fistula infection now with purulent discharge -On vancomycin Adjustment per ID depending on culture data - Vascular performed I&D/ligation of AV fistula on 12/19/2020.  Dressing management per their service.  Status post repeat or visit for washout and wound VAC placement on 12/31/2020.  Anticipating to leave VAC/dressing in place until Friday at least. -Infectious disease following, Wound Cx shows MRSA - Pain control, dry dressing.  ESRD on hemodialysis TTS -Temporary HD removed and tunneled cath placed on 10/31.  HD per nephro  Hyperkalemia -Resolved.    Atrial flutter/fibrillation.  Rate better controlled -Continue metoprolol to 50mg  BID.  Cleared by vascular to transition her back to Eliquis.  History of HIV - CD4 count 252, continue  antiretrovirals  Anemia of chronic disease - Baseline hemoglobin 8.0.  Continue to monitor  History of chronic pain syndrome - On fentanyl patch and methadone  DVT prophylaxis: Eliquis Code Status: Full Family Communication:  None  Status is: Inpatient  Remains inpatient appropriate because: Fluid management per vascular team, infectious disease following.   Nutritional status  Nutrition Problem: Severe Malnutrition Etiology: chronic illness (ESRD on HD)  Signs/Symptoms: severe fat depletion, severe muscle depletion  Interventions: Snacks, MVI, Other (Comment) (double protein)  Body mass index is 18.29 kg/m.       Subjective: Patient seen in the dialysis, does not have any complaints.  Tolerated OR for washout and wound VAC placement yesterday.  Remains afebrile overnight.  Review of systems: General: Denies fever, chills, night sweats or unintended weight loss. Resp: Denies cough, wheezing, shortness of breath. Cardiac: Denies chest pain, palpitations, orthopnea, paroxysmal nocturnal dyspnea. GI: Denies abdominal pain, nausea, vomiting, diarrhea or constipation GU: Denies dysuria, frequency, hesitancy or incontinence MS: Denies muscle aches, joint pain or swelling Neuro: Denies headache, neurologic deficits (focal weakness, numbness, tingling), abnormal gait Psych: Denies anxiety, depression, SI/HI/AVH Skin: Denies new rashes or lesions ID: Denies sick contacts, exotic exposures, travel   Examination:  Constitutional: Not in acute distress Respiratory: Clear to auscultation bilaterally Cardiovascular: Normal sinus rhythm, no rubs Abdomen: Nontender nondistended good bowel sounds Musculoskeletal: No edema noted Skin: Right upper extremity swelling is improved, wound VAC is in place. Neurologic: CN 2-12 grossly intact.  And nonfocal Psychiatric: Normal judgment and insight. Alert and oriented x 3. Normal mood. Objective: Vitals:   01/01/21 0830 01/01/21  0900 01/01/21 0930 01/01/21 1000  BP: (!) 146/75 (!) 153/78 (!) 143/75 (!) 141/73  Pulse: (!) 49 (!) 47 (!) 46 (!) 50  Resp: 16 12 17    Temp:      TempSrc:      SpO2:      Weight:      Height:        Intake/Output Summary (Last 24 hours) at 01/01/2021 1037 Last data filed at 01/01/2021 0730 Gross per 24 hour  Intake 325 ml  Output 20 ml  Net 305 ml   Filed Weights   12/28/20 1149 12/31/20 1137 01/01/21 0753  Weight: 45.7 kg 45.7 kg 46.1 kg     Data Reviewed:   CBC: Recent Labs  Lab 12/28/20 0247 12/29/20 1022 12/30/20 0503 12/31/20 0806 01/01/21 0059  WBC 9.8 10.6* 6.5 5.2 7.3  HGB 10.9* 10.9* 10.0* 10.1* 10.2*  HCT 33.0* 34.0* 32.1* 32.4* 32.2*  MCV 96.8 97.7 99.4 99.4 99.1  PLT 161 174 147* 151 191   Basic Metabolic Panel: Recent Labs  Lab 12/27/20 0958 12/28/20 0247 12/28/20 0735 12/29/20 1022 12/30/20 0503 12/30/20 1313 12/31/20 0806 01/01/21 0059  NA 137  --  137  --  136  --  133* 133*  K 4.0  --  4.0  --  3.4*  --  3.9 4.8  CL 98  --  100  --  102  --  98 98  CO2 27  --  25  --  24  --  22 22  GLUCOSE 89  --  77  --  71  --  81 134*  BUN 30*  --  40*  --  33*  --  43* 51*  CREATININE 6.26*  --  7.35*  --  6.86*  --  8.25* 9.17*  CALCIUM 9.4  --  9.3  --  8.2*  --  8.7* 8.5*  MG  --  2.2  --  2.0 2.0  --  2.1 2.2  PHOS  --   --  7.6*  --   --  5.0*  --   --    GFR: Estimated Creatinine Clearance: 4.3 mL/min (A) (by C-G formula based on SCr of 9.17 mg/dL (H)). Liver Function Tests: Recent Labs  Lab 12/28/20 0735  ALBUMIN 2.3*   No results for input(s): LIPASE, AMYLASE in the last 168 hours. No results for input(s): AMMONIA in the last 168 hours. Coagulation Profile: No results for input(s): INR, PROTIME in the last 168 hours. Cardiac Enzymes: No results for input(s): CKTOTAL, CKMB, CKMBINDEX, TROPONINI in the last 168 hours. BNP (last 3 results) No results for input(s): PROBNP in the last 8760 hours. HbA1C: No results for input(s):  HGBA1C in the last 72 hours. CBG: Recent Labs  Lab 12/31/20 1111 12/31/20 1606 12/31/20 2040 01/01/21 0015 01/01/21 0446  GLUCAP 77 86 91 128* 95   Lipid Profile: No results for input(s): CHOL, HDL, LDLCALC, TRIG, CHOLHDL, LDLDIRECT in the last 72 hours. Thyroid Function Tests: No results for input(s): TSH, T4TOTAL, FREET4, T3FREE, THYROIDAB in the last 72 hours. Anemia Panel: No results for input(s): VITAMINB12, FOLATE, FERRITIN, TIBC, IRON, RETICCTPCT in the last 72 hours. Sepsis Labs: No results for input(s): PROCALCITON, LATICACIDVEN in the last 168 hours.   Recent Results (from the past 240 hour(s))  Resp Panel by RT-PCR (Flu A&B, Covid) Nasopharyngeal Swab     Status: None   Collection Time: 12/24/20  2:53 PM   Specimen: Nasopharyngeal Swab; Nasopharyngeal(NP) swabs in  vial transport medium  Result Value Ref Range Status   SARS Coronavirus 2 by RT PCR NEGATIVE NEGATIVE Final    Comment: (NOTE) SARS-CoV-2 target nucleic acids are NOT DETECTED.  The SARS-CoV-2 RNA is generally detectable in upper respiratory specimens during the acute phase of infection. The lowest concentration of SARS-CoV-2 viral copies this assay can detect is 138 copies/mL. A negative result does not preclude SARS-Cov-2 infection and should not be used as the sole basis for treatment or other patient management decisions. A negative result may occur with  improper specimen collection/handling, submission of specimen other than nasopharyngeal swab, presence of viral mutation(s) within the areas targeted by this assay, and inadequate number of viral copies(<138 copies/mL). A negative result must be combined with clinical observations, patient history, and epidemiological information. The expected result is Negative.  Fact Sheet for Patients:  EntrepreneurPulse.com.au  Fact Sheet for Healthcare Providers:  IncredibleEmployment.be  This test is no t yet approved  or cleared by the [place] FDA and  has been authorized for detection and/or diagnosis of SARS-CoV-2 by FDA under an Emergency Use Authorization (EUA). This EUA will remain  in effect (meaning this test can be used) for the duration of the COVID-19 declaration under Section 564(b)(1) of the Act, 21 U.S.C.section 360bbb-3(b)(1), unless the authorization is terminated  or revoked sooner.       Influenza A by PCR NEGATIVE NEGATIVE Final   Influenza B by PCR NEGATIVE NEGATIVE Final    Comment: (NOTE) The Xpert Xpress SARS-CoV-2/FLU/RSV plus assay is intended as an aid in the diagnosis of influenza from Nasopharyngeal swab specimens and should not be used as a sole basis for treatment. Nasal washings and aspirates are unacceptable for Xpert Xpress SARS-CoV-2/FLU/RSV testing.  Fact Sheet for Patients: EntrepreneurPulse.com.au  Fact Sheet for Healthcare Providers: IncredibleEmployment.be  This test is not yet approved or cleared by the [place] FDA and has been authorized for detection and/or diagnosis of SARS-CoV-2 by FDA under an Emergency Use Authorization (EUA). This EUA will remain in effect (meaning this test can be used) for the duration of the COVID-19 declaration under Section 564(b)(1) of the Act, 21 U.S.C. section 360bbb-3(b)(1), unless the authorization is terminated or revoked.  Performed at [facility], [address] [place], [place] [address]   Blood culture (routine x 2)     Status: None   Collection Time: 12/24/20  3:19 PM   Specimen: BLOOD LEFT HAND  Result Value Ref Range Status   Specimen Description BLOOD LEFT HAND  Final   Special Requests   Final    BOTTLES DRAWN AEROBIC AND ANAEROBIC Blood Culture adequate volume   Culture   Final    NO GROWTH 5 DAYS Performed at [facility], [address], [place], [place] [address]    Report Status 12/29/2020 FINAL  Final  Blood culture (routine x 2)     Status: None    Collection Time: 12/24/20  3:19 PM   Specimen: BLOOD LEFT ARM  Result Value Ref Range Status   Specimen Description BLOOD LEFT ARM  Final   Special Requests   Final    BOTTLES DRAWN AEROBIC AND ANAEROBIC Blood Culture adequate volume   Culture   Final    NO GROWTH 5 DAYS Performed at [facility], [address] [place], [place] [address]    Report Status 12/29/2020 FINAL  Final  Surgical pcr screen     Status: None   Collection Time: 12/26/20 12:42 PM   Specimen: Nasal  Mucosa; Nasal Swab  Result Value Ref Range Status   MRSA, PCR NEGATIVE NEGATIVE Final   Staphylococcus aureus NEGATIVE NEGATIVE Final    Comment: (NOTE) The Xpert SA Assay (FDA approved for NASAL specimens in patients 46 years of age and older), is one component of a comprehensive surveillance program. It is not intended to diagnose infection nor to guide or monitor treatment. Performed at [facility], [place] [address], [place], [place] [address]   Aerobic Culture w Gram Stain (superficial specimen)     Status: None   Collection Time: 12/27/20 12:19 PM   Specimen: Abscess; Wound  Result Value Ref Range Status   Specimen Description ABSCESS  Final   Special Requests ARM RIGHT  Final   Gram Stain   Final    FEW WBC PRESENT,BOTH PMN AND MONONUCLEAR FEW GRAM POSITIVE COCCI IN PAIRS Performed at [facility], [address], [place], [place] [address]    Culture   Final    MODERATE METHICILLIN RESISTANT STAPHYLOCOCCUS AUREUS   Report Status 12/29/2020 FINAL  Final   Organism ID, Bacteria METHICILLIN RESISTANT STAPHYLOCOCCUS AUREUS  Final      Susceptibility   Methicillin resistant staphylococcus aureus - MIC*    CIPROFLOXACIN >=8 RESISTANT Resistant     ERYTHROMYCIN >=8 RESISTANT Resistant     GENTAMICIN <=0.5 SENSITIVE Sensitive     OXACILLIN >=4 RESISTANT Resistant     TETRACYCLINE <=1 SENSITIVE Sensitive     VANCOMYCIN 1 SENSITIVE Sensitive     TRIMETH/SULFA <=10 SENSITIVE Sensitive      CLINDAMYCIN <=0.25 SENSITIVE Sensitive     RIFAMPIN <=0.5 SENSITIVE Sensitive     Inducible Clindamycin NEGATIVE Sensitive     * MODERATE METHICILLIN RESISTANT STAPHYLOCOCCUS AUREUS  Aerobic/Anaerobic Culture w Gram Stain (surgical/deep wound)     Status: None (Preliminary result)   Collection Time: 12/27/20  2:39 PM   Specimen: Wound; Tissue  Result Value Ref Range Status   Specimen Description TISSUE  Final   Special Requests RIGHT ARM WOUND FOR CULTURES SPEC A  Final   Gram Stain   Final    RARE WBC PRESENT, PREDOMINANTLY PMN RARE GRAM POSITIVE COCCI IN PAIRS Performed at [facility], [place] [address], [place], [place] [address]    Culture   Final    RARE METHICILLIN RESISTANT STAPHYLOCOCCUS AUREUS NO ANAEROBES ISOLATED; CULTURE IN PROGRESS FOR 5 DAYS    Report Status PENDING  Incomplete   Organism ID, Bacteria METHICILLIN RESISTANT STAPHYLOCOCCUS AUREUS  Final      Susceptibility   Methicillin resistant staphylococcus aureus - MIC*    CIPROFLOXACIN >=8 RESISTANT Resistant     ERYTHROMYCIN >=8 RESISTANT Resistant     GENTAMICIN <=0.5 SENSITIVE Sensitive     OXACILLIN >=4 RESISTANT Resistant     TETRACYCLINE <=1 SENSITIVE Sensitive     VANCOMYCIN 1 SENSITIVE Sensitive     TRIMETH/SULFA <=10 SENSITIVE Sensitive     CLINDAMYCIN <=0.25 SENSITIVE Sensitive     RIFAMPIN <=0.5 SENSITIVE Sensitive     Inducible Clindamycin NEGATIVE Sensitive     * RARE METHICILLIN RESISTANT STAPHYLOCOCCUS AUREUS         Radiology Studies: IR US Guide Vasc Access Left  Result Date: 12/31/2020 INDICATION: 70 year old female with a history of end-stage renal disease on dialysis. She has a malfunctioning right upper extremity AV fistula in the setting of right innominate venous stenosis/stent occlusion. She underwent placement of a temporary HD catheter via a left IJ approach on 12/26/2020  and now presents for conversion to a more durable tunneled catheter.   EXAM: TUNNELED CENTRAL VENOUS  HEMODIALYSIS CATHETER PLACEMENT WITH FLUOROSCOPIC GUIDANCE   MEDICATIONS: 1 g vancomycin. The antibiotic was given in an appropriate time interval prior to skin puncture.   ANESTHESIA/SEDATION: Moderate (conscious) sedation was employed during this procedure. A total of Versed 1 mg and Fentanyl 25 mcg was administered intravenously.   Moderate Sedation Time: 15 minutes. The patient's level of consciousness and vital signs were monitored continuously by radiology nursing throughout the procedure under my direct supervision.   FLUOROSCOPY TIME:  Fluoroscopy Time: 0 minutes 18 seconds (0 mGy).   COMPLICATIONS: None immediate.   PROCEDURE: Informed written consent was obtained from the patient after a discussion of the risks, benefits, and alternatives to treatment. Questions regarding the procedure were encouraged and answered. The left neck and chest were prepped with chlorhexidine in a sterile fashion, and a sterile drape was applied covering the operative field. Maximum barrier sterile technique with sterile gowns and gloves were used for the procedure. A timeout was performed prior to the initiation of the procedure.   A stiff Glidewire was advanced through the existing temporary hemodialysis catheter to the level of the. A suitable skin exit site was selected inferior to the clavicle. Local anesthesia was attained by infiltration with 1% lidocaine. A small dermatotomy was made. A Palindrome tunneled hemodialysis catheter measuring 23 cm from tip to cuff was tunneled in a retrograde fashion from the anterior chest wall to the venotomy incision.   The existing non tunneled hemodialysis catheter was then removed over the stiff Glidewire. A peel-away sheath was advanced over the Glidewire and positioned in the SVC. The new tunneled catheter was then placed through the peel-away sheath with tips ultimately positioned within the superior aspect of the right atrium. Final catheter positioning was confirmed and documented  with a spot radiographic image. The catheter aspirates and flushes normally. The catheter was flushed with appropriate volume heparin dwells.   The catheter exit site was secured with a 0-Silk retention sutures. The venotomy incision was closed with Dermabond. Dressings were applied. The patient tolerated the procedure well without immediate post procedural complication.   IMPRESSION: Successful conversion to a new 23 cm tip to cuff palindrome tunneled hemodialysis catheter via the left internal jugular vein with tips terminating within the superior aspect of the right atrium. The catheter is ready for immediate use.     Electronically Signed   By: Jacqulynn Cadet M.D.   On: 12/30/2020 12:32         Scheduled Meds:  calcitRIOL  1.25 mcg Oral Q T,Th,Sa-HD   Chlorhexidine Gluconate Cloth  6 each Topical Q0600   cinacalcet  60 mg Oral Q T,Th,Sa-HD   darbepoetin (ARANESP) injection - DIALYSIS  200 mcg Intravenous Q Thu-HD   dolutegravir  50 mg Oral q1600   fentaNYL  1 patch Transdermal Q72H   lamiVUDine  50 mg Oral Daily   methadone  2.5 mg Oral Q2000   metoprolol tartrate  50 mg Oral BID   mirtazapine  15 mg Oral QHS   multivitamin  1 tablet Oral QHS   pantoprazole  40 mg Oral QAC breakfast   vancomycin variable dose per unstable renal function (pharmacist dosing)   Does not apply See admin instructions   venlafaxine XR  150 mg Oral Q breakfast   zidovudine  300 mg Oral Daily   Continuous Infusions:  heparin 900 Units/hr (01/01/21 0003)  LOS: 8 days   Time spent= 35 mins    Samreet Edenfield Arsenio Loader, MD [facility]  If 7PM-7AM, please contact night-coverage  01/01/2021, 10:37 AM

## 2021-01-01 NOTE — Progress Notes (Addendum)
   VASCULAR SURGERY ASSESSMENT & PLAN:   ESRD: Infection and necrosis of right forearm AVF s/p ligation of fistula with I and D of right upper extremity wound. Wash-out yesterday with placement of wound VAC.  The vascular surgery team will change this VAC dressing on Friday at her bedside.  Afebrile. Vancomycin continues>>course per primary service.  Previous DCB angioplasty of right subclavian and right innominate veins with stent placement.  Left TDC placed by Dr. Johnney Ou on 12/30/2020.  History of atrial fibrillation/RVR on heparin infusion. May resume apixaban from vascular surgery standpoint.  SUBJECTIVE:   Complaining of pain at [facility] site.   PHYSICAL EXAM:   Vitals:   12/31/20 1421 12/31/20 1611 12/31/20 2034 01/01/21 0427  BP: (!) 160/102 (!) 158/100 (!) 128/100 126/78  Pulse: (!) 59 61 61 (!) 56  Resp: 15 16 15 14   Temp: 97.6 F (36.4 C) 97.7 F (36.5 C) (!) 97.5 F (36.4 C)   TempSrc:  Oral Oral   SpO2: 98% 99% 97% 96%  Weight:      Height:       General appearance: awake, alert in NAD Chest: catheter site without bleeding or erythema. Dressing dry and intact. Respirations: unlabored; no dyspnea at rest Right upper extremity: Hand is warm with 1/5 grip strength>>secondary to pain with touch. Sensation intact.  2+ radial pulse. VAC with good seal and minimal thin, dark bloody drainage.  LABS:   Lab Results  Component Value Date   WBC 7.3 01/01/2021   HGB 10.2 (L) 01/01/2021   HCT 32.2 (L) 01/01/2021   MCV 99.1 01/01/2021   PLT 179 01/01/2021   Lab Results  Component Value Date   CREATININE 9.17 (H) 01/01/2021   Lab Results  Component Value Date   INR 1.14 12/28/2011   CBG (last 3)  Recent Labs    12/31/20 2040 01/01/21 0015 01/01/21 0446  GLUCAP 91 128* 95    PROBLEM LIST:    Active Problems:   Human immunodeficiency virus (HIV) disease ([facility])   Essential hypertension   ESRD on dialysis ([place])   Atrial flutter ([facility])   Anemia of chronic  disease   Infected fluid collection with fistula   Hypoglycemia   CURRENT MEDS:    calcitRIOL  1.25 mcg Oral Q T,Th,Sa-HD   Chlorhexidine Gluconate Cloth  6 each Topical Q0600   cinacalcet  60 mg Oral Q T,Th,Sa-HD   darbepoetin (ARANESP) injection - DIALYSIS  200 mcg Intravenous Q Thu-HD   dolutegravir  50 mg Oral q1600   fentaNYL  1 patch Transdermal Q72H   lamiVUDine  50 mg Oral Daily   methadone  2.5 mg Oral Q2000   metoprolol tartrate  50 mg Oral BID   mirtazapine  15 mg Oral QHS   multivitamin  1 tablet Oral QHS   pantoprazole  40 mg Oral QAC breakfast   vancomycin variable dose per unstable renal function (pharmacist dosing)   Does not apply See admin instructions   venlafaxine XR  150 mg Oral Q breakfast   zidovudine  300 mg Oral Daily   Barbie Banner, PA-C  Office: [phone] 01/01/2021   I have independently interviewed and examined patient and agree with PA assessment and plan above.   Nadja Lina C. Donzetta Matters, MD [facility] Office: [phone] Pager: [phone]

## 2021-01-01 NOTE — Progress Notes (Signed)
[facility] for Infectious Disease    Date of Admission:  12/24/2020   Total days of antibiotics 9           ID: [name] is a 70 y.o. female with MRSA bacteremia 2/2 AVF infection s/p ligation and debridement Active Problems:   Human immunodeficiency virus (HIV) disease ([place])   Essential hypertension   ESRD on dialysis ([place])   Atrial flutter ([place])   Anemia of chronic disease   Infected fluid collection with fistula   Hypoglycemia    Subjective: Afebrile. Underwent HD without difficulty; had washout of right arm wound, and wound vac placed yesterday since no closable skin over the wound.  Medications:   apixaban  2.5 mg Oral BID   calcitRIOL  1.25 mcg Oral Q T,Th,Sa-HD   Chlorhexidine Gluconate Cloth  6 each Topical Q0600   cinacalcet  60 mg Oral Q T,Th,Sa-HD   darbepoetin (ARANESP) injection - DIALYSIS  200 mcg Intravenous Q Thu-HD   dolutegravir  50 mg Oral q1600   fentaNYL  1 patch Transdermal Q72H   heparin sodium (porcine)       lamiVUDine  50 mg Oral Daily   methadone  2.5 mg Oral Q2000   metoprolol tartrate  50 mg Oral BID   mirtazapine  15 mg Oral QHS   multivitamin  1 tablet Oral QHS   pantoprazole  40 mg Oral QAC breakfast   vancomycin variable dose per unstable renal function (pharmacist dosing)   Does not apply See admin instructions   venlafaxine XR  150 mg Oral Q breakfast   zidovudine  300 mg Oral Daily    Objective: Vital signs in last 24 hours: Temp:  [97.5 F (36.4 C)-98.9 F (37.2 C)] 98.2 F (36.8 C) (11/02 1631) Pulse Rate:  [40-67] 40 (11/02 1631) Resp:  [12-18] 18 (11/02 1631) BP: (123-160)/(55-105) 130/105 (11/02 1631) SpO2:  [95 %-100 %] 97 % (11/02 1631) Weight:  [45.3 kg-46.1 kg] 45.3 kg (11/02 1115) Physical Exam  Constitutional:  oriented to person, place, and time. appears well-developed and well-nourished. No distress.  HENT: [place]/AT, PERRLA, no scleral icterus Mouth/Throat: Oropharynx is clear and moist. No  oropharyngeal exudate.  Cardiovascular: Normal rate, regular rhythm and normal heart sounds. Exam reveals no gallop and no friction rub.  No murmur heard.  Pulmonary/Chest: Effort normal and breath sounds normal. No respiratory distress.  has no wheezes.  Neck = supple, no nuchal rigidity Abdominal: Soft. Bowel sounds are normal.  exhibits no distension. There is no tenderness.  Ext: left arm vac in place Neurological: alert and oriented to person, place, and time.  Skin: Skin is warm and dry. No rash noted. No erythema.  Psychiatric: a normal mood and affect.  behavior is normal.    Lab Results Recent Labs    12/31/20 0806 01/01/21 0059  WBC 5.2 7.3  HGB 10.1* 10.2*  HCT 32.4* 32.2*  NA 133* 133*  K 3.9 4.8  CL 98 98  CO2 22 22  BUN 43* 51*  CREATININE 8.25* 9.17*    Microbiology:  Methicillin resistant staphylococcus aureus      MIC    CIPROFLOXACIN >=8 RESISTANT  Resistant    CLINDAMYCIN <=0.25 SENS... Sensitive    ERYTHROMYCIN >=8 RESISTANT  Resistant    GENTAMICIN <=0.5 SENSI... Sensitive    Inducible Clindamycin NEGATIVE  Sensitive    OXACILLIN >=4 RESISTANT  Resistant    RIFAMPIN <=0.5 SENSI... Sensitive    TETRACYCLINE <=1 SENSITIVE  Sensitive  TRIMETH/SULFA <=10 SENSIT... Sensitive    VANCOMYCIN 1 SENSITIVE  Sensitive    Studies/Results: No results found.   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 Currently on DTG, lamivudine and zidovudine  Afib = on [place] for Infectious Diseases Pager: [phone]  01/01/2021, 5:14 PM

## 2021-01-01 NOTE — Progress Notes (Signed)
[place] for heparin Indication: atrial fibrillation  Allergies  Allergen Reactions   Lactose Intolerance (Gi) Other (See Comments)    On MAR   Latex Rash and Itching   Penicillins Rash and Swelling    Has patient had a PCN reaction causing immediate rash, facial/tongue/throat swelling, SOB or lightheadedness with hypotension: No Has patient had a PCN reaction causing severe rash involving mucus membranes or skin necrosis: No Has patient had a PCN reaction that required hospitalization No Has patient had a PCN reaction occurring within the last 10 years: No If all of the above answers are "NO", then may proceed with Cephalosporin use.      Patient Measurements: Height: 5' 2.5" (158.8 cm) Weight: 45.7 kg (100 lb 12 oz) IBW/kg (Calculated) : 51.25 Heparin Dosing Weight: TBW  Vital Signs: Temp: 97.5 F (36.4 C) (11/01 2034) Temp Source: Oral (11/01 2034) BP: 128/100 (11/01 2034) Pulse Rate: 61 (11/01 2034)  Labs: Recent Labs    12/29/20 1022 12/29/20 2044 12/30/20 0503 12/30/20 2217 12/31/20 0806 01/01/21 0059  HGB 10.9*  --  10.0*  --  10.1* 10.2*  HCT 34.0*  --  32.1*  --  32.4* 32.2*  PLT 174  --  147*  --  151 179  APTT 35  --   --   --   --   --   HEPARINUNFRC 0.11*   < > 0.23* 0.20* 0.40 0.43  CREATININE  --   --  6.86*  --  8.25* 9.17*   < > = values in this interval not displayed.     Estimated Creatinine Clearance: 4.3 mL/min (A) (by C-G formula based on SCr of 9.17 mg/dL (H)).   Assessment: 72 yof admitted with an infected RUE fistula. Apixaban from PTA for Aflutter held on admit, last dose 10/24. She is s/p ligation right arm AV fistula and I&D right arm wound on 10/28. She had Afib with RVR in HD 10/29. Pharmacy consulted to begin heparin drip with no bolus; will also attempt to keep goal range on low end due to low Hgb needing transfusion on 10/28.   Pt is s/p TDC>>Palindrome conversion 10/31. Heparin resumed  post procedure.  Heparin level therapeutic (0.43) on gtt at 900 units/hr. No bleeding noted.  Goal of Therapy:  Heparin level 0.3-0.5 units/ml Monitor platelets by anticoagulation protocol: Yes   Plan:   Continue heparin infusion at 900 units/hr. F/u daily heparin level and CBC  Sherlon Handing, PharmD, BCPS Please see amion for complete clinical pharmacist phone list 01/01/2021 1:59 AM

## 2021-01-01 NOTE — Progress Notes (Signed)
[place] for heparin >>PTA apixaban Indication: atrial fibrillation  Allergies  Allergen Reactions   Lactose Intolerance (Gi) Other (See Comments)    On MAR   Latex Rash and Itching   Penicillins Rash and Swelling    Has patient had a PCN reaction causing immediate rash, facial/tongue/throat swelling, SOB or lightheadedness with hypotension: No Has patient had a PCN reaction causing severe rash involving mucus membranes or skin necrosis: No Has patient had a PCN reaction that required hospitalization No Has patient had a PCN reaction occurring within the last 10 years: No If all of the above answers are "NO", then may proceed with Cephalosporin use.      Patient Measurements: Height: 5' 2.5" (158.8 cm) Weight: 46.1 kg (101 lb 10.1 oz) IBW/kg (Calculated) : 51.25 Heparin Dosing Weight: TBW  Vital Signs: Temp: 98.7 F (37.1 C) (11/02 0803) Temp Source: Oral (11/02 0753) BP: 136/84 (11/02 1030) Pulse Rate: 54 (11/02 1030)  Labs: Recent Labs    12/30/20 0503 12/30/20 2217 12/31/20 0806 01/01/21 0059  HGB 10.0*  --  10.1* 10.2*  HCT 32.1*  --  32.4* 32.2*  PLT 147*  --  151 179  HEPARINUNFRC 0.23* 0.20* 0.40 0.43  CREATININE 6.86*  --  8.25* 9.17*     Estimated Creatinine Clearance: 4.3 mL/min (A) (by C-G formula based on SCr of 9.17 mg/dL (H)).   Assessment: 41 yof admitted with an infected RUE fistula. Apixaban from PTA for Aflutter held on admit, last dose 10/24. She is s/p ligation right arm AV fistula and I&D right arm wound on 10/28. She had Afib with RVR in HD 10/29. Pharmacy consulted to begin heparin drip with no bolus; will also attempt to keep goal range on low end due to low Hgb needing transfusion on 10/28.     11/2 AM update: OK per MD to d/c heparin and restart home apixaban 2.5mg  BID  Goal of Therapy:  Heparin level 0.3-0.5 units/ml Monitor platelets by anticoagulation protocol: Yes   Plan:   D/C heparin  once back from HD Give apixaban 2.5mg  BID once back from HD  Lizabeth Fellner A. Levada Dy, PharmD, BCPS, FNKF Clinical Pharmacist [place] Please utilize Amion for appropriate phone number to reach the unit pharmacist ([place])  01/01/2021 11:06 AM

## 2021-01-01 NOTE — Progress Notes (Signed)
Patient ID: [name], female   DOB: 09/01/1950, 70 y.o.   MRN: [id number] S: Underwent surgery yesterday with wound VAC in place.  Unable to close arm.  Denies any complaints today.   O:BP 126/70 (BP Location: Right Leg)   Pulse 67   Temp 97.7 F (36.5 C)   Resp 18   Ht 5' 2.5" (1.588 m)   Wt 45.3 kg   LMP  (LMP Unknown)   SpO2 95%   BMI 17.98 kg/m   Intake/Output Summary (Last 24 hours) at 01/01/2021 1333 Last data filed at 01/01/2021 1115 Gross per 24 hour  Intake 169.98 ml  Output 1033 ml  Net -863.02 ml   Intake/Output: I/O last 3 completed shifts: In: 648.6 [P.O.:300; I.V.:333.6; Other:15] Out: 20 [Blood:20]  Intake/Output this shift:  Total I/O In: 46 [P.O.:10; I.V.:36] Out: 1033 [Other:1033] Weight change:  Gen:NAD CVS: Normal rate, no obvious rub Resp: Bilateral chest rise with no increased work of breathing Abd: +BS,s oft, NT/ND Ext: RUE with dressing from wrist to mid bicep.    Recent Labs  Lab 12/26/20 0508 12/27/20 0958 12/28/20 0735 12/30/20 0503 12/30/20 1313 12/31/20 0806 01/01/21 0059  NA 130* 137 137 136  --  133* 133*  K 6.2* 4.0 4.0 3.4*  --  3.9 4.8  CL 93* 98 100 102  --  98 98  CO2 20* 27 25 24   --  22 22  GLUCOSE 72 89 77 71  --  81 134*  BUN 103* 30* 40* 33*  --  43* 51*  CREATININE 13.14* 6.26* 7.35* 6.86*  --  8.25* 9.17*  ALBUMIN  --   --  2.3*  --   --   --   --   CALCIUM 9.2 9.4 9.3 8.2*  --  8.7* 8.5*  PHOS  --   --  7.6*  --  5.0*  --   --    Liver Function Tests: Recent Labs  Lab 12/28/20 0735  ALBUMIN 2.3*   No results for input(s): LIPASE, AMYLASE in the last 168 hours. No results for input(s): AMMONIA in the last 168 hours. CBC: Recent Labs  Lab 12/28/20 0247 12/29/20 1022 12/30/20 0503 12/31/20 0806 01/01/21 0059  WBC 9.8 10.6* 6.5 5.2 7.3  HGB 10.9* 10.9* 10.0* 10.1* 10.2*  HCT 33.0* 34.0* 32.1* 32.4* 32.2*  MCV 96.8 97.7 99.4 99.4 99.1  PLT 161 174 147* 151 179   Cardiac Enzymes: No results for  input(s): CKTOTAL, CKMB, CKMBINDEX, TROPONINI in the last 168 hours. CBG: Recent Labs  Lab 12/31/20 1606 12/31/20 2040 01/01/21 0015 01/01/21 0446 01/01/21 1211  GLUCAP 86 91 128* 95 75    Iron Studies: No results for input(s): IRON, TIBC, TRANSFERRIN, FERRITIN in the last 72 hours. Studies/Results: No results found.  apixaban  2.5 mg Oral BID   calcitRIOL  1.25 mcg Oral Q T,Th,Sa-HD   Chlorhexidine Gluconate Cloth  6 each Topical Q0600   cinacalcet  60 mg Oral Q T,Th,Sa-HD   darbepoetin (ARANESP) injection - DIALYSIS  200 mcg Intravenous Q Thu-HD   dolutegravir  50 mg Oral q1600   fentaNYL  1 patch Transdermal Q72H   heparin sodium (porcine)       lamiVUDine  50 mg Oral Daily   methadone  2.5 mg Oral Q2000   metoprolol tartrate  50 mg Oral BID   mirtazapine  15 mg Oral QHS   multivitamin  1 tablet Oral QHS   pantoprazole  40 mg Oral QAC  breakfast   vancomycin variable dose per unstable renal function (pharmacist dosing)   Does not apply See admin instructions   venlafaxine XR  150 mg Oral Q breakfast   zidovudine  300 mg Oral Daily    BMET    Component Value Date/Time   NA 133 (L) 01/01/2021 0059   K 4.8 01/01/2021 0059   CL 98 01/01/2021 0059   CO2 22 01/01/2021 0059   GLUCOSE 134 (H) 01/01/2021 0059   BUN 51 (H) 01/01/2021 0059   CREATININE 9.17 (H) 01/01/2021 0059   CREATININE 5.66 (H) 12/22/2019 1117   CALCIUM 8.5 (L) 01/01/2021 0059   CALCIUM 9.0 03/12/2011 0527   GFRNONAA 4 (L) 01/01/2021 0059   GFRNONAA 5 (L) 02/12/2015 1000   GFRAA 9 (L) 10/22/2019 0105   GFRAA 5 (L) 02/12/2015 1000   CBC    Component Value Date/Time   WBC 7.3 01/01/2021 0059   RBC 3.25 (L) 01/01/2021 0059   HGB 10.2 (L) 01/01/2021 0059   HCT 32.2 (L) 01/01/2021 0059   PLT 179 01/01/2021 0059   MCV 99.1 01/01/2021 0059   MCH 31.4 01/01/2021 0059   MCHC 31.7 01/01/2021 0059   RDW 18.3 (H) 01/01/2021 0059   LYMPHSABS 1.1 12/25/2020 0409   MONOABS 0.7 12/25/2020 0409   EOSABS  0.1 12/25/2020 0409   BASOSABS 0.0 12/25/2020 0409    Dialysis Orders: Center: [facility]  on TTS . EDW 41.5kg HD Bath 2K/2.5Ca  Time 3 hours Heparin 1000 units bolus then 500 units/hr. 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  appreciate vascular surgery evaluation-  s/p ligation and debridement 10/28.  Now with wound VAC in place.  On vanc. MRSA on culture  ESRD -continue dialysis TTS schedule.  Missed yesterday's of getting dialysis today.  Resume schedule tomorrow  Hypertension/volume  - stable- no BP meds-  Uf as able-  has prespecified max of 3.5 liters  Anemia  - Hb 10s. continue with ESA.    Metabolic bone disease -  continue with calcitriol and sensipar-phosphorus level 5  Nutrition - renal diet. AFib/FL:  improved with increased metoprolol dose HIV on meds   [name]

## 2021-01-02 DIAGNOSIS — L089 Local infection of the skin and subcutaneous tissue, unspecified: Secondary | ICD-10-CM | POA: Diagnosis not present

## 2021-01-02 DIAGNOSIS — I4892 Unspecified atrial flutter: Secondary | ICD-10-CM | POA: Diagnosis not present

## 2021-01-02 DIAGNOSIS — I10 Essential (primary) hypertension: Secondary | ICD-10-CM | POA: Diagnosis not present

## 2021-01-02 DIAGNOSIS — N186 End stage renal disease: Secondary | ICD-10-CM | POA: Diagnosis not present

## 2021-01-02 DIAGNOSIS — B20 Human immunodeficiency virus [HIV] disease: Secondary | ICD-10-CM | POA: Diagnosis not present

## 2021-01-02 DIAGNOSIS — Z992 Dependence on renal dialysis: Secondary | ICD-10-CM | POA: Diagnosis not present

## 2021-01-02 LAB — CBC
HCT: 29.5 % — ABNORMAL LOW (ref 36.0–46.0)
Hemoglobin: 9 g/dL — ABNORMAL LOW (ref 12.0–15.0)
MCH: 30.9 pg (ref 26.0–34.0)
MCHC: 30.5 g/dL (ref 30.0–36.0)
MCV: 101.4 fL — ABNORMAL HIGH (ref 80.0–100.0)
Platelets: 142 10*3/uL — ABNORMAL LOW (ref 150–400)
RBC: 2.91 MIL/uL — ABNORMAL LOW (ref 3.87–5.11)
RDW: 18.6 % — ABNORMAL HIGH (ref 11.5–15.5)
WBC: 4.3 10*3/uL (ref 4.0–10.5)
nRBC: 0 % (ref 0.0–0.2)

## 2021-01-02 LAB — GLUCOSE, CAPILLARY
Glucose-Capillary: 133 mg/dL — ABNORMAL HIGH (ref 70–99)
Glucose-Capillary: 210 mg/dL — ABNORMAL HIGH (ref 70–99)
Glucose-Capillary: 80 mg/dL (ref 70–99)
Glucose-Capillary: 81 mg/dL (ref 70–99)
Glucose-Capillary: 95 mg/dL (ref 70–99)
Glucose-Capillary: 96 mg/dL (ref 70–99)

## 2021-01-02 LAB — BASIC METABOLIC PANEL
Anion gap: 7 (ref 5–15)
BUN: 23 mg/dL (ref 8–23)
CO2: 27 mmol/L (ref 22–32)
Calcium: 8.1 mg/dL — ABNORMAL LOW (ref 8.9–10.3)
Chloride: 105 mmol/L (ref 98–111)
Creatinine, Ser: 5.26 mg/dL — ABNORMAL HIGH (ref 0.44–1.00)
GFR, Estimated: 8 mL/min — ABNORMAL LOW (ref >60)
Glucose, Bld: 83 mg/dL (ref 70–99)
Potassium: 3.9 mmol/L (ref 3.5–5.1)
Sodium: 139 mmol/L (ref 135–145)

## 2021-01-02 LAB — VANCOMYCIN, RANDOM: Vancomycin Rm: 44

## 2021-01-02 LAB — MAGNESIUM: Magnesium: 2 mg/dL (ref 1.7–2.4)

## 2021-01-02 LAB — HEPATITIS B SURFACE ANTIBODY,QUALITATIVE: Hep B S Ab: REACTIVE — AB

## 2021-01-02 MED ORDER — PENTAFLUOROPROP-TETRAFLUOROETH EX AERO: 1.0000 "application " | INHALATION_SPRAY | CUTANEOUS | Status: DC | PRN

## 2021-01-02 MED ORDER — SODIUM CHLORIDE 0.9 % IV SOLN: 100.0000 mL | INTRAVENOUS | Status: DC | PRN

## 2021-01-02 MED ORDER — HEPARIN SODIUM (PORCINE) 1000 UNIT/ML DIALYSIS: 1000.0000 [IU] | INTRAMUSCULAR | Status: DC | PRN

## 2021-01-02 MED ORDER — LIDOCAINE-PRILOCAINE 2.5-2.5 % EX CREA: 1.0000 "application " | TOPICAL_CREAM | CUTANEOUS | Status: DC | PRN

## 2021-01-02 MED ORDER — LIDOCAINE HCL (PF) 1 % IJ SOLN: 5.0000 mL | INTRAMUSCULAR | Status: DC | PRN

## 2021-01-02 MED ORDER — ALTEPLASE 2 MG IJ SOLR: 2.0000 mg | Freq: Once | INTRAMUSCULAR | Status: DC | PRN

## 2021-01-02 MED ORDER — DIPHENHYDRAMINE HCL 25 MG PO CAPS
ORAL_CAPSULE | ORAL | Status: AC
  Filled 2021-01-02: qty 1

## 2021-01-02 NOTE — Progress Notes (Signed)
Occupational Therapy Treatment Patient Details Name: [name] MRN: [id number] DOB: [birth year] Today's Date: 01/02/2021   History of present illness 70 y.o. female [facility] hospital on 12/24/2020 with RUE fistula infection. Pt underwent temporary HDU catheter placement on 12/26/2020. S/p ligation and debridement 12/27/2020. PMH includes ESRD on HDU TTS, HIV, a flutter.   OT comments  Patient received in bed and complained of nausea but was willing to work with OT at bed level.  Patient performed grooming and addressed self feeding with finger foods with setup and extra time due to not feeling well.  Patient limited by nausea. Acute OT to continue to follow.     Recommendations for follow up therapy are one component of a multi-disciplinary discharge planning process, led by the attending physician.  Recommendations may be updated based on patient status, additional functional criteria and insurance authorization.    Follow Up Recommendations  Long-term institutional care without follow-up therapy    Assistance Recommended at Discharge Frequent or constant Supervision/Assistance  Equipment Recommendations  Other (comment)    Recommendations for Other Services      Precautions / Restrictions Precautions Precautions: Fall Precaution Comments: R arm swelling and ozzing purulent drainage at the elbow. Restrictions Weight Bearing Restrictions: No       Mobility Bed Mobility Overal bed mobility: Needs Assistance             General bed mobility comments: mod assist to adjust in bed    Transfers                         Balance                                           ADL either performed or assessed with clinical judgement   ADL Overall ADL's : Needs assistance/impaired Eating/Feeding: Set up Eating/Feeding Details (indicate cue type and reason): patient feed self finger foods with LUE Grooming: Wash/dry face;Sitting;Bed  level Grooming Details (indicate cue type and reason): performed bed level due to nauseous                               General ADL Comments: Patient seen bed level due to nauseous     Vision       Perception     Praxis      Cognition Arousal/Alertness: Awake/alert Behavior During Therapy: WFL for tasks assessed/performed Overall Cognitive Status: No family/caregiver present to determine baseline cognitive functioning                                 General Comments: patient stating she felt nauseous          Exercises     Shoulder Instructions       General Comments      Pertinent Vitals/ Pain       Pain Assessment: Faces Faces Pain Scale: Hurts little more Pain Location: RUE Pain Descriptors / Indicators: Aching Pain Intervention(s): Monitored during session  Home Living                                          Prior  Functioning/Environment              Frequency  Min 2X/week        Progress Toward Goals  OT Goals(current goals can now be found in the care plan section)  Progress towards OT goals: Progressing toward goals  Acute Rehab OT Goals Patient Stated Goal: not stated OT Goal Formulation: With patient Time For Goal Achievement: 01/10/21 Potential to Achieve Goals: Good ADL Goals Pt Will Perform Eating: with set-up;sitting Pt Will Perform Grooming: sitting;with supervision Pt Will Perform Upper Body Bathing: with min assist;sitting Pt Will Perform Upper Body Dressing: with min assist;sitting Pt Will Transfer to Toilet: with mod assist;squat pivot transfer;bedside commode Pt Will Perform Toileting - Clothing Manipulation and hygiene: with mod assist;sitting/lateral leans  Plan Discharge plan remains appropriate    Co-evaluation                 AM-PAC OT "6 Clicks" Daily Activity     Outcome Measure   Help from another person eating meals?: A Lot Help from another person  taking care of personal grooming?: A Lot Help from another person toileting, which includes using toliet, bedpan, or urinal?: Total Help from another person bathing (including washing, rinsing, drying)?: Total Help from another person to put on and taking off regular upper body clothing?: Total Help from another person to put on and taking off regular lower body clothing?: Total 6 Click Score: 8    End of Session    OT Visit Diagnosis: Muscle weakness (generalized) (M62.81);Pain Pain - Right/Left: Right Pain - part of body: Arm   Activity Tolerance Other (comment) (patient limited due to nauseous)   Patient Left     Nurse Communication          Time: 4098-1191 OT Time Calculation (min): 26 min  Charges: OT General Charges $OT Visit: 1 Visit OT Treatments $Self Care/Home Management : 23-37 mins  Lodema Hong, [place]  Pager [phone] Office [place] 01/02/2021, 8:32 AM

## 2021-01-02 NOTE — Progress Notes (Signed)
[facility] for Infectious Disease    Date of Admission:  12/24/2020   Total days of antibiotics 10           ID: [name] is a 70 y.o. female with  MRSA vascular avf infection and well controlled hiv disease Active Problems:   Human immunodeficiency virus (HIV) disease ([place])   Essential hypertension   ESRD on dialysis ([place])   Atrial flutter ([place])   Anemia of chronic disease   Infected fluid collection with fistula   Hypoglycemia    Subjective: Tolerating HD through temp catheter. Wound vac in place on right arm AVF since debridement  Medications:   apixaban  2.5 mg Oral BID   calcitRIOL  1.25 mcg Oral Q T,Th,Sa-HD   Chlorhexidine Gluconate Cloth  6 each Topical Q0600   cinacalcet  60 mg Oral Q T,Th,Sa-HD   darbepoetin (ARANESP) injection - DIALYSIS  200 mcg Intravenous Q Thu-HD   diphenhydrAMINE       dolutegravir  50 mg Oral q1600   fentaNYL  1 patch Transdermal Q72H   lamiVUDine  50 mg Oral Daily   methadone  2.5 mg Oral Q2000   metoprolol tartrate  50 mg Oral BID   mirtazapine  15 mg Oral QHS   multivitamin  1 tablet Oral QHS   pantoprazole  40 mg Oral QAC breakfast   vancomycin variable dose per unstable renal function (pharmacist dosing)   Does not apply See admin instructions   venlafaxine XR  150 mg Oral Q breakfast   zidovudine  300 mg Oral Daily    Objective: Vital signs in last 24 hours: Temp:  [98.3 F (36.8 C)-99 F (37.2 C)] 98.9 F (37.2 C) (11/03 1400) Pulse Rate:  [43-73] 73 (11/03 1400) Resp:  [15-18] 16 (11/03 1400) BP: (124-178)/(74-112) 138/112 (11/03 1400) SpO2:  [97 %-99 %] 98 % (11/03 1400) Weight:  [44.7 kg-45.3 kg] 44.7 kg (11/03 1251) Physical Exam  Constitutional:  oriented to person, place, and time. appears well-developed and well-nourished. No distress.  HENT: [place]/AT, PERRLA, no scleral icterus Mouth/Throat: Oropharynx is clear and moist. No oropharyngeal exudate.  Cardiovascular: Normal rate, regular rhythm and  normal heart sounds. Exam reveals no gallop and no friction rub.  No murmur heard.  Pulmonary/Chest: Effort normal and breath sounds normal. No respiratory distress.  has no wheezes.  Neck = supple, no nuchal rigidity Chest wall = temp catheter Ext = right arm wound vac Abdominal: Soft. Bowel sounds are normal.  exhibits no distension. There is no tenderness.  Lymphadenopathy: no cervical adenopathy. No axillary adenopathy Neurological: alert and oriented to person, place, and time.  Skin: Skin is warm and dry. No rash noted. No erythema.  Psychiatric: a normal mood and affect.  behavior is normal.    Lab Results Recent Labs    01/01/21 0059 01/02/21 0349  WBC 7.3 4.3  HGB 10.2* 9.0*  HCT 32.2* 29.5*  NA 133* 139  K 4.8 3.9  CL 98 105  CO2 22 27  BUN 51* 23  CREATININE 9.17* 5.26*   Liver Panel No results for input(s): PROT, ALBUMIN, AST, ALT, ALKPHOS, BILITOT, BILIDIR, IBILI in the last 72 hours. Sedimentation Rate No results for input(s): ESRSEDRATE in the last 72 hours. C-Reactive Protein No results for input(s): CRP in the last 72 hours.  Microbiology: reviewed Studies/Results: No results found.   Assessment/Plan: MRSA AVF infection s/p I x D, now wound vac = continue on vancomycin post HD. Accidentally received  pre-op dose of vancomycin where she has been supratherapeutic. End date through dec 14th.  HIV disease= well controlled. Continue on tivicay, lamivudine, zidovudine.  Will see back in the ID clinic in 4-5 wk to see how she is recovering  Will seing off.  [facility] Infectious [facility] Pager: [phone]  01/02/2021, 4:35 PM

## 2021-01-02 NOTE — Progress Notes (Signed)
OT Cancellation Note  Patient Details Name: [name] MRN: [id number] DOB: 20-Mar-1950   Progress Note:    PT noted patient is holding her R wrist in a flexed position, and inquired about the possibility of R resting hand splint to support it.  OT is reaching out to the MD to inquire about ordering a splint.    Aneliese Beaudry D Argelio Granier 01/02/2021, 5:17 PM

## 2021-01-02 NOTE — Progress Notes (Signed)
PROGRESS NOTE    [name]  JGG:[id number] DOB: 12-31-1950 DOA: 12/24/2020 PCP: Hilbert Corrigan, MD   Brief Narrative:  70 year old with history of ESRD on HD Tuesday Thursday Saturday, HIV, a flutter on Eliquis comes to the hospital with complaints of right upper extremity AV fistula infection.  She had an AV fistula occlusion about 4 weeks ago and underwent balloon angioplasty of the right subclavian and right innominate vein with conversion to right forearm fistula to right upper extremity fistula.  She initially went to [facility] where she was diagnosed with AV fistula infection and transferred to [facility].  Vascular, nephro, IR and infectious disease are consulted.  Patient underwent ligation of AV fistula and I&D on 12/27/2020.  I have placed a tunneled catheter 10/31.  Patient returned to the OR by vascular 11/1 for washout and wound VAC placement.   Assessment & Plan:   Active Problems:   Human immunodeficiency virus (HIV) disease ([place])   Essential hypertension   ESRD on dialysis [facility])   Atrial flutter ([place])   Anemia of chronic disease   Infected fluid collection with fistula   Hypoglycemia   Right AV fistula infection now with purulent discharge - Vascular performed I&D/ligation of AV fistula on 12/19/2020.  Dressing management per their service.  Status post repeat or visit for washout and wound VAC placement on 12/31/2020.  Anticipating to leave VAC/dressing in place until Friday at least. -ID planning for Vanc with HD for total of 6 weeks.  - Pain control, dry dressing.  ESRD on hemodialysis TTS -Temporary HD removed and tunneled cath placed on 10/31.  HD per nephro  Hyperkalemia -Resolved.    Atrial flutter/fibrillation.  Rate better controlled -Continue metoprolol to 50mg  BID.  Cleared by vascular to transition her back to Eliquis.  History of HIV - CD4 count 252, continue antiretrovirals  Anemia of chronic disease - Baseline  hemoglobin 8.0.  Continue to monitor  History of chronic pain syndrome - On fentanyl patch and methadone  Severe protein calorie malnutrition -encourage oral intake. May end up needing adv feeding tube but would avoid it for now in the setting of active infection.   DVT prophylaxis: Eliquis Code Status: Full Family Communication:  None  Status is: Inpatient  Remains inpatient appropriate because: Fluid management per vascular team, infectious disease following.   Nutritional status  Nutrition Problem: Severe Malnutrition Etiology: chronic illness (ESRD on HD)  Signs/Symptoms: severe fat depletion, severe muscle depletion  Interventions: Snacks, MVI, Other (Comment) (double protein)  Body mass index is 17.94 kg/m.       Subjective: Doing ok no complaints.   Review of systems: General = no fevers, chills, dizziness,  fatigue HEENT/EYES = negative for loss of vision, double vision, blurred vision,  sore throa Cardiovascular= negative for chest pain, palpitation Respiratory/lungs= negative for shortness of breath, cough, wheezing; hemoptysis,  Gastrointestinal= negative for nausea, vomiting, abdominal pain Genitourinary= negative for Dysuria MSK = Negative for arthralgia, myalgias Neurology= Negative for headache, numbness, tingling  Psychiatry= Negative for suicidal and homocidal ideation Skin= Negative for Rash    Examination:  Constitutional: Not in acute distress Respiratory: Clear to auscultation bilaterally Cardiovascular: Normal sinus rhythm, no rubs Abdomen: Nontender nondistended good bowel sounds Musculoskeletal: No edema noted Skin: No rashes seen Neurologic: CN 2-12 grossly intact.  And nonfocal Psychiatric: Normal judgment and insight. Alert and oriented x 3. Normal mood.   Wound vac in place RUE Objective: Vitals:   01/02/21 0852 01/02/21 1017  01/02/21 1030 01/02/21 1100  BP: (!) 155/83 (!) 154/82 (!) 150/77 133/88  Pulse: 67 63 62 (!) 51   Resp: 16     Temp: 99 F (37.2 C)     TempSrc: Oral     SpO2: 99%     Weight: 45.2 kg     Height:        Intake/Output Summary (Last 24 hours) at 01/02/2021 1205 Last data filed at 01/01/2021 2133 Gross per 24 hour  Intake 169.35 ml  Output 75 ml  Net 94.35 ml   Filed Weights   01/01/21 1115 01/01/21 2133 01/02/21 0852  Weight: 45.3 kg 45.3 kg 45.2 kg     Data Reviewed:   CBC: Recent Labs  Lab 12/29/20 1022 12/30/20 0503 12/31/20 0806 01/01/21 0059 01/02/21 0349  WBC 10.6* 6.5 5.2 7.3 4.3  HGB 10.9* 10.0* 10.1* 10.2* 9.0*  HCT 34.0* 32.1* 32.4* 32.2* 29.5*  MCV 97.7 99.4 99.4 99.1 101.4*  PLT 174 147* 151 179 366*   Basic Metabolic Panel: Recent Labs  Lab 12/28/20 0735 12/29/20 1022 12/30/20 0503 12/30/20 1313 12/31/20 0806 01/01/21 0059 01/02/21 0349  NA 137  --  136  --  133* 133* 139  K 4.0  --  3.4*  --  3.9 4.8 3.9  CL 100  --  102  --  98 98 105  CO2 25  --  24  --  22 22 27   GLUCOSE 77  --  71  --  81 134* 83  BUN 40*  --  33*  --  43* 51* 23  CREATININE 7.35*  --  6.86*  --  8.25* 9.17* 5.26*  CALCIUM 9.3  --  8.2*  --  8.7* 8.5* 8.1*  MG  --  2.0 2.0  --  2.1 2.2 2.0  PHOS 7.6*  --   --  5.0*  --   --   --    GFR: Estimated Creatinine Clearance: 7.4 mL/min (A) (by C-G formula based on SCr of 5.26 mg/dL (H)). Liver Function Tests: Recent Labs  Lab 12/28/20 0735  ALBUMIN 2.3*   No results for input(s): LIPASE, AMYLASE in the last 168 hours. No results for input(s): AMMONIA in the last 168 hours. Coagulation Profile: No results for input(s): INR, PROTIME in the last 168 hours. Cardiac Enzymes: No results for input(s): CKTOTAL, CKMB, CKMBINDEX, TROPONINI in the last 168 hours. BNP (last 3 results) No results for input(s): PROBNP in the last 8760 hours. HbA1C: No results for input(s): HGBA1C in the last 72 hours. CBG: Recent Labs  Lab 01/01/21 1211 01/01/21 1610 01/02/21 0022 01/02/21 0445 01/02/21 0748  GLUCAP 75 94 210* 95 80    Lipid Profile: No results for input(s): CHOL, HDL, LDLCALC, TRIG, CHOLHDL, LDLDIRECT in the last 72 hours. Thyroid Function Tests: No results for input(s): TSH, T4TOTAL, FREET4, T3FREE, THYROIDAB in the last 72 hours. Anemia Panel: No results for input(s): VITAMINB12, FOLATE, FERRITIN, TIBC, IRON, RETICCTPCT in the last 72 hours. Sepsis Labs: No results for input(s): PROCALCITON, LATICACIDVEN in the last 168 hours.   Recent Results (from the past 240 hour(s))  Resp Panel by RT-PCR (Flu A&B, Covid) Nasopharyngeal Swab     Status: None   Collection Time: 12/24/20  2:53 PM   Specimen: Nasopharyngeal Swab; Nasopharyngeal(NP) swabs in vial transport medium  Result Value Ref Range Status   SARS Coronavirus 2 by RT PCR NEGATIVE NEGATIVE Final    Comment: (NOTE) SARS-CoV-2 target nucleic acids are NOT DETECTED.  The SARS-CoV-2 RNA is generally detectable in upper respiratory specimens during the acute phase of infection. The lowest concentration of SARS-CoV-2 viral copies this assay can detect is 138 copies/mL. A negative result does not preclude SARS-Cov-2 infection and should not be used as the sole basis for treatment or other patient management decisions. A negative result may occur with  improper specimen collection/handling, submission of specimen other than nasopharyngeal swab, presence of viral mutation(s) within the areas targeted by this assay, and inadequate number of viral copies(<138 copies/mL). A negative result must be combined with clinical observations, patient history, and epidemiological information. The expected result is Negative.  Fact Sheet for Patients:  EntrepreneurPulse.com.au  Fact Sheet for Healthcare Providers:  IncredibleEmployment.be  This test is no t yet approved or cleared by the [place] FDA and  has been authorized for detection and/or diagnosis of SARS-CoV-2 by FDA under an Emergency Use Authorization  (EUA). This EUA will remain  in effect (meaning this test can be used) for the duration of the COVID-19 declaration under Section 564(b)(1) of the Act, 21 U.S.C.section 360bbb-3(b)(1), unless the authorization is terminated  or revoked sooner.       Influenza A by PCR NEGATIVE NEGATIVE Final   Influenza B by PCR NEGATIVE NEGATIVE Final    Comment: (NOTE) The Xpert Xpress SARS-CoV-2/FLU/RSV plus assay is intended as an aid in the diagnosis of influenza from Nasopharyngeal swab specimens and should not be used as a sole basis for treatment. Nasal washings and aspirates are unacceptable for Xpert Xpress SARS-CoV-2/FLU/RSV testing.  Fact Sheet for Patients: EntrepreneurPulse.com.au  Fact Sheet for Healthcare Providers: IncredibleEmployment.be  This test is not yet approved or cleared by the [place] FDA and has been authorized for detection and/or diagnosis of SARS-CoV-2 by FDA under an Emergency Use Authorization (EUA). This EUA will remain in effect (meaning this test can be used) for the duration of the COVID-19 declaration under Section 564(b)(1) of the Act, 21 U.S.C. section 360bbb-3(b)(1), unless the authorization is terminated or revoked.  Performed at [facility], [address] [place], [place] [address]   Blood culture (routine x 2)     Status: None   Collection Time: 12/24/20  3:19 PM   Specimen: BLOOD LEFT HAND  Result Value Ref Range Status   Specimen Description BLOOD LEFT HAND  Final   Special Requests   Final    BOTTLES DRAWN AEROBIC AND ANAEROBIC Blood Culture adequate volume   Culture   Final    NO GROWTH 5 DAYS Performed at [facility], [address], [place], [place] [address]    Report Status 12/29/2020 FINAL  Final  Blood culture (routine x 2)     Status: None   Collection Time: 12/24/20  3:19 PM   Specimen: BLOOD LEFT ARM  Result Value Ref Range Status   Specimen Description BLOOD LEFT ARM  Final   Special  Requests   Final    BOTTLES DRAWN AEROBIC AND ANAEROBIC Blood Culture adequate volume   Culture   Final    NO GROWTH 5 DAYS Performed at [facility], [address] [place], [place] [address]    Report Status 12/29/2020 FINAL  Final  Surgical pcr screen     Status: None   Collection Time: 12/26/20 12:42 PM   Specimen: Nasal Mucosa; Nasal Swab  Result Value Ref Range Status   MRSA, PCR NEGATIVE NEGATIVE Final   Staphylococcus aureus NEGATIVE NEGATIVE Final    Comment: (NOTE) The Xpert SA Assay (FDA  approved for NASAL specimens in patients 82 years of age and older), is one component of a comprehensive surveillance program. It is not intended to diagnose infection nor to guide or monitor treatment. Performed at [facility], [place] [address] [place], [place] [address]   Aerobic Culture w Gram Stain (superficial specimen)     Status: None   Collection Time: 12/27/20 12:19 PM   Specimen: Abscess; Wound  Result Value Ref Range Status   Specimen Description ABSCESS  Final   Special Requests ARM RIGHT  Final   Gram Stain   Final    FEW WBC PRESENT,BOTH PMN AND MONONUCLEAR FEW GRAM POSITIVE COCCI IN PAIRS Performed at [facility], [address], [place], [place] [address]    Culture   Final    MODERATE METHICILLIN RESISTANT STAPHYLOCOCCUS AUREUS   Report Status 12/29/2020 FINAL  Final   Organism ID, Bacteria METHICILLIN RESISTANT STAPHYLOCOCCUS AUREUS  Final      Susceptibility   Methicillin resistant staphylococcus aureus - MIC*    CIPROFLOXACIN >=8 RESISTANT Resistant     ERYTHROMYCIN >=8 RESISTANT Resistant     GENTAMICIN <=0.5 SENSITIVE Sensitive     OXACILLIN >=4 RESISTANT Resistant     TETRACYCLINE <=1 SENSITIVE Sensitive     VANCOMYCIN 1 SENSITIVE Sensitive     TRIMETH/SULFA <=10 SENSITIVE Sensitive     CLINDAMYCIN <=0.25 SENSITIVE Sensitive     RIFAMPIN <=0.5 SENSITIVE Sensitive     Inducible Clindamycin NEGATIVE Sensitive     * MODERATE METHICILLIN  RESISTANT STAPHYLOCOCCUS AUREUS  Aerobic/Anaerobic Culture w Gram Stain (surgical/deep wound)     Status: None   Collection Time: 12/27/20  2:39 PM   Specimen: Wound; Tissue  Result Value Ref Range Status   Specimen Description TISSUE  Final   Special Requests RIGHT ARM WOUND FOR CULTURES SPEC A  Final   Gram Stain   Final    RARE WBC PRESENT, PREDOMINANTLY PMN RARE GRAM POSITIVE COCCI IN PAIRS    Culture   Final    RARE METHICILLIN RESISTANT STAPHYLOCOCCUS AUREUS NO ANAEROBES ISOLATED Performed at [facility], [place] [address] [place],  [address]    Report Status 01/01/2021 FINAL  Final   Organism ID, Bacteria METHICILLIN RESISTANT STAPHYLOCOCCUS AUREUS  Final      Susceptibility   Methicillin resistant staphylococcus aureus - MIC*    CIPROFLOXACIN >=8 RESISTANT Resistant     ERYTHROMYCIN >=8 RESISTANT Resistant     GENTAMICIN <=0.5 SENSITIVE Sensitive     OXACILLIN >=4 RESISTANT Resistant     TETRACYCLINE <=1 SENSITIVE Sensitive     VANCOMYCIN 1 SENSITIVE Sensitive     TRIMETH/SULFA <=10 SENSITIVE Sensitive     CLINDAMYCIN <=0.25 SENSITIVE Sensitive     RIFAMPIN <=0.5 SENSITIVE Sensitive     Inducible Clindamycin NEGATIVE Sensitive     * RARE METHICILLIN RESISTANT STAPHYLOCOCCUS AUREUS         Radiology Studies: No results found.      Scheduled Meds:  apixaban  2.5 mg Oral BID   calcitRIOL  1.25 mcg Oral Q T,Th,Sa-HD   Chlorhexidine Gluconate Cloth  6 each Topical Q0600   cinacalcet  60 mg Oral Q T,Th,Sa-HD   darbepoetin (ARANESP) injection - DIALYSIS  200 mcg Intravenous Q Thu-HD   diphenhydrAMINE       dolutegravir  50 mg Oral q1600   fentaNYL  1 patch Transdermal Q72H   lamiVUDine  50 mg Oral Daily   methadone  2.5 mg  Oral Q2000   metoprolol tartrate  50 mg Oral BID   mirtazapine  15 mg Oral QHS   multivitamin  1 tablet Oral QHS   pantoprazole  40 mg Oral QAC breakfast   vancomycin variable dose per unstable renal function (pharmacist  dosing)   Does not apply See admin instructions   venlafaxine XR  150 mg Oral Q breakfast   zidovudine  300 mg Oral Daily   Continuous Infusions:  sodium chloride     sodium chloride       LOS: 9 days   Time spent= 35 mins    Glendi Mohiuddin Arsenio Loader, MD [facility]  If 7PM-7AM, please contact night-coverage  01/02/2021, 12:05 PM

## 2021-01-02 NOTE — Progress Notes (Signed)
Patient ID: [name], female   DOB: 08/22/1950, 70 y.o.   MRN: [id number] S: Patient undergoing dialysis today.  Feels well no complaints   O:BP (!) 150/77   Pulse 62   Temp 99 F (37.2 C) (Oral)   Resp 16   Ht 5' 2.5" (1.588 m)   Wt 45.2 kg   LMP  (LMP Unknown)   SpO2 99%   BMI 17.94 kg/m   Intake/Output Summary (Last 24 hours) at 01/02/2021 1100 Last data filed at 01/01/2021 2133 Gross per 24 hour  Intake 169.35 ml  Output 1108 ml  Net -938.65 ml   Intake/Output: I/O last 3 completed shifts: In: 324.3 [P.O.:260; I.V.:64.3] Out: 1108 [Drains:75; [id number]]  Intake/Output this shift:  No intake/output data recorded. Weight change: 0.4 kg Gen:NAD CVS: Normal rate, no obvious rub Resp: Bilateral chest rise with no increased work of breathing Abd: +BS,s oft, NT/ND Ext: RUE with wound VAC in place  Recent Labs  Lab 12/27/20 0958 12/28/20 0735 12/30/20 0503 12/30/20 1313 12/31/20 0806 01/01/21 0059 01/02/21 0349  NA 137 137 136  --  133* 133* 139  K 4.0 4.0 3.4*  --  3.9 4.8 3.9  CL 98 100 102  --  98 98 105  CO2 27 25 24   --  22 22 27   GLUCOSE 89 77 71  --  81 134* 83  BUN 30* 40* 33*  --  43* 51* 23  CREATININE 6.26* 7.35* 6.86*  --  8.25* 9.17* 5.26*  ALBUMIN  --  2.3*  --   --   --   --   --   CALCIUM 9.4 9.3 8.2*  --  8.7* 8.5* 8.1*  PHOS  --  7.6*  --  5.0*  --   --   --    Liver Function Tests: Recent Labs  Lab 12/28/20 0735  ALBUMIN 2.3*   No results for input(s): LIPASE, AMYLASE in the last 168 hours. No results for input(s): AMMONIA in the last 168 hours. CBC: Recent Labs  Lab 12/29/20 1022 12/30/20 0503 12/31/20 0806 01/01/21 0059 01/02/21 0349  WBC 10.6* 6.5 5.2 7.3 4.3  HGB 10.9* 10.0* 10.1* 10.2* 9.0*  HCT 34.0* 32.1* 32.4* 32.2* 29.5*  MCV 97.7 99.4 99.4 99.1 101.4*  PLT 174 147* 151 179 142*   Cardiac Enzymes: No results for input(s): CKTOTAL, CKMB, CKMBINDEX, TROPONINI in the last 168 hours. CBG: Recent Labs  Lab  01/01/21 1211 01/01/21 1610 01/02/21 0022 01/02/21 0445 01/02/21 0748  GLUCAP 75 94 210* 95 80    Iron Studies: No results for input(s): IRON, TIBC, TRANSFERRIN, FERRITIN in the last 72 hours. Studies/Results: No results found.  apixaban  2.5 mg Oral BID   calcitRIOL  1.25 mcg Oral Q T,Th,Sa-HD   Chlorhexidine Gluconate Cloth  6 each Topical Q0600   cinacalcet  60 mg Oral Q T,Th,Sa-HD   darbepoetin (ARANESP) injection - DIALYSIS  200 mcg Intravenous Q Thu-HD   dolutegravir  50 mg Oral q1600   fentaNYL  1 patch Transdermal Q72H   lamiVUDine  50 mg Oral Daily   methadone  2.5 mg Oral Q2000   metoprolol tartrate  50 mg Oral BID   mirtazapine  15 mg Oral QHS   multivitamin  1 tablet Oral QHS   pantoprazole  40 mg Oral QAC breakfast   vancomycin variable dose per unstable renal function (pharmacist dosing)   Does not apply See admin instructions   venlafaxine XR  150 mg Oral  Q breakfast   zidovudine  300 mg Oral Daily    BMET    Component Value Date/Time   NA 139 01/02/2021 0349   K 3.9 01/02/2021 0349   CL 105 01/02/2021 0349   CO2 27 01/02/2021 0349   GLUCOSE 83 01/02/2021 0349   BUN 23 01/02/2021 0349   CREATININE 5.26 (H) 01/02/2021 0349   CREATININE 5.66 (H) 12/22/2019 1117   CALCIUM 8.1 (L) 01/02/2021 0349   CALCIUM 9.0 03/12/2011 0527   GFRNONAA 8 (L) 01/02/2021 0349   GFRNONAA 5 (L) 02/12/2015 1000   GFRAA 9 (L) 10/22/2019 0105   GFRAA 5 (L) 02/12/2015 1000   CBC    Component Value Date/Time   WBC 4.3 01/02/2021 0349   RBC 2.91 (L) 01/02/2021 0349   HGB 9.0 (L) 01/02/2021 0349   HCT 29.5 (L) 01/02/2021 0349   PLT 142 (L) 01/02/2021 0349   MCV 101.4 (H) 01/02/2021 0349   MCH 30.9 01/02/2021 0349   MCHC 30.5 01/02/2021 0349   RDW 18.6 (H) 01/02/2021 0349   LYMPHSABS 1.1 12/25/2020 0409   MONOABS 0.7 12/25/2020 0409   EOSABS 0.1 12/25/2020 0409   BASOSABS 0.0 12/25/2020 0409    Dialysis Orders: Center: [facility]  on TTS . EDW 41.5kg HD Bath  2K/2.5Ca  Time 3 hours Heparin 1000 units bolus then 500 units/hr. 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  appreciate vascular surgery evaluation-  s/p ligation and debridement 10/28.  Now with wound VAC in place.  On vanc. MRSA on culture  ESRD -continue dialysis.  Typically TTS schedule but missed on Tuesday.  Dialysis again today to get back on schedule  Hypertension/volume  - stable- no BP meds-  Uf as able-  has prespecified max of 3.5 liters  Anemia  - Hb 10s. continue with ESA.    Metabolic bone disease -  continue with calcitriol and sensipar-phosphorus level 5  Nutrition - renal diet. AFib/FL:  improved with increased metoprolol dose HIV on meds   [name]

## 2021-01-02 NOTE — Progress Notes (Signed)
[place] for vanc Indication: cath infection  Allergies  Allergen Reactions   Lactose Intolerance (Gi) Other (See Comments)    On MAR   Latex Rash and Itching   Penicillins Rash and Swelling    Has patient had a PCN reaction causing immediate rash, facial/tongue/throat swelling, SOB or lightheadedness with hypotension: No Has patient had a PCN reaction causing severe rash involving mucus membranes or skin necrosis: No Has patient had a PCN reaction that required hospitalization No Has patient had a PCN reaction occurring within the last 10 years: No If all of the above answers are "NO", then may proceed with Cephalosporin use.      Patient Measurements: Height: 5' 2.5" (158.8 cm) Weight: 45.2 kg (99 lb 10.4 oz) IBW/kg (Calculated) : 51.25 Heparin Dosing Weight: TBW  Vital Signs: Temp: 99 F (37.2 C) (11/03 0852) Temp Source: Oral (11/03 0852) BP: 155/83 (11/03 0852) Pulse Rate: 67 (11/03 0852)  Labs: Recent Labs    12/30/20 2217 12/31/20 0806 12/31/20 0806 01/01/21 0059 01/02/21 0349  HGB  --  10.1*   < > 10.2* 9.0*  HCT  --  32.4*  --  32.2* 29.5*  PLT  --  151  --  179 142*  HEPARINUNFRC 0.20* 0.40  --  0.43  --   CREATININE  --  8.25*  --  9.17* 5.26*   < > = values in this interval not displayed.     Estimated Creatinine Clearance: 7.4 mL/min (A) (by C-G formula based on SCr of 5.26 mg/dL (H)).   Assessment: 61 yof admitted with an infected RUE fistula. Apixaban from PTA for Aflutter held on admit, last dose 10/24. She is s/p ligation right arm AV fistula and I&D right arm wound on 10/28. Marland Kitchen  Pt is s/p TDC>>Palindrome conversion. After getting 2 additional dose of vanc while on schedule vanc, random vanc level came back at 82 on 11/1. Patient received 3 hours of HD on 11/2 and 2.5h hours today. Pre-HD level today was 44 mcg/ml. Will need to hold vancomycin until next HD session at least.   Goal of Therapy:  PreHD  vanc: 15-25   Plan:   Continue to hold vanc today Consider restarting vanc with HD on 11/5  Keneshia Tena A. Levada Dy, PharmD, BCPS, FNKF Clinical Pharmacist [place] Please utilize Amion for appropriate phone number to reach the unit pharmacist ([place])  01/02/2021 10:07 AM

## 2021-01-02 NOTE — Progress Notes (Signed)
Physical Therapy Treatment Patient Details Name: [name] MRN: [id number] DOB: 1950/11/23 Today's Date: 01/02/2021   History of Present Illness 70 y.o. female presents to [facility] hospital on 12/24/2020 with RUE fistula infection. Pt underwent temporary HDU catheter placement on 12/26/2020. S/p ligation and debridement 12/27/2020. PMH includes ESRD on HDU TTS, HIV, a flutter.    PT Comments    Pt tolerates treatment well, ambulating for short distances with assistance of walker and PT. Pt demonstrates assistance requirements to power up into standing due to LE weakness. Pt also shows profound weakness of R finger flexion/extension and R wrist flexion/extension. PT speaks with OT after session as the pt may benefit from a resting hand splint to maintain wrist and digit ROM as RUE begins to heal. Pt will continue to benefit from aggressive mobilization to improve activity tolerance and reduce falls risk. PT recommends return to SNF when medically ready.   Recommendations for follow up therapy are one component of a multi-disciplinary discharge planning process, led by the attending physician.  Recommendations may be updated based on patient status, additional functional criteria and insurance authorization.  Follow Up Recommendations  Skilled nursing-short term rehab (<3 hours/day)     Assistance Recommended at Discharge Intermittent Supervision/Assistance  Equipment Recommendations   (defer to post-acute)    Recommendations for Other Services       Precautions / Restrictions Precautions Precautions: Fall Precaution Comments: R arm wound vac Restrictions Weight Bearing Restrictions: No     Mobility  Bed Mobility Overal bed mobility: Needs Assistance Bed Mobility: Supine to Sit;Sit to Supine     Supine to sit: Min assist Sit to supine: Min guard   General bed mobility comments: assistance to pivot hips to edge of bed    Transfers Overall transfer level: Needs  assistance Equipment used: Rolling walker (2 wheels);1 person hand held assist Transfers: Sit to/from [facility] Sit to Stand: Min assist Stand pivot transfers: Min assist         General transfer comment: assistance to power up into standing, some assist to prevent LOB when turning without walker    Ambulation/Gait Ambulation/Gait assistance: Min assist Gait Distance (Feet): 14 Feet Assistive device: Rolling walker (2 wheels) Gait Pattern/deviations: Step-to pattern Gait velocity: reduced Gait velocity interpretation: <1.8 ft/sec, indicate of risk for recurrent falls General Gait Details: pt with slowed step-to gait, reduced step length, increased time to turn due to RUE strength deficits   Stairs             Wheelchair Mobility    Modified Rankin (Stroke Patients Only)       Balance Overall balance assessment: Needs assistance Sitting-balance support: No upper extremity supported;Feet supported Sitting balance-Leahy Scale: Fair     Standing balance support: Single extremity supported;Bilateral upper extremity supported Standing balance-Leahy Scale: Poor Standing balance comment: benefits from UE support of walker or hand hold                            Cognition Arousal/Alertness: Awake/alert Behavior During Therapy: WFL for tasks assessed/performed Overall Cognitive Status: No family/caregiver present to determine baseline cognitive functioning                                 General Comments: pt follows commands well, is oriented to person, place, situation.        Exercises  General Comments General comments (skin integrity, edema, etc.): VSS on RA      Pertinent Vitals/Pain Pain Assessment: Faces Faces Pain Scale: Hurts little more Pain Location: RUE Pain Descriptors / Indicators: Sore Pain Intervention(s): Monitored during session    Home Living                          Prior  Function            PT Goals (current goals can now be found in the care plan section) Acute Rehab PT Goals Patient Stated Goal: to reduce pain Progress towards PT goals: Progressing toward goals    Frequency    Min 2X/week      PT Plan Current plan remains appropriate    Co-evaluation              AM-PAC PT "6 Clicks" Mobility   Outcome Measure  Help needed turning from your back to your side while in a flat bed without using bedrails?: A Little Help needed moving from lying on your back to sitting on the side of a flat bed without using bedrails?: A Little Help needed moving to and from a bed to a chair (including a wheelchair)?: A Little Help needed standing up from a chair using your arms (e.g., wheelchair or bedside chair)?: A Little Help needed to walk in hospital room?: A Little Help needed climbing 3-5 steps with a railing? : A Lot 6 Click Score: 17    End of Session   Activity Tolerance: Patient tolerated treatment well Patient left: in bed;with call bell/phone within reach;with bed alarm set Nurse Communication: Mobility status PT Visit Diagnosis: Other abnormalities of gait and mobility (R26.89);Muscle weakness (generalized) (M62.81);Pain Pain - Right/Left: Right Pain - part of body: Arm     Time: 4270-6237 PT Time Calculation (min) (ACUTE ONLY): 23 min  Charges:  $Gait Training: 8-22 mins $Therapeutic Activity: 8-22 mins                     Zenaida Niece, PT, DPT Acute Rehabilitation Pager: [phone] Office [place] 01/02/2021, 4:28 PM

## 2021-01-02 NOTE — Progress Notes (Addendum)
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  Previous DCB angioplasty of right subclavian and right innominate veins with stent placement. HD via left TDC placed by Dr. Johnney Ou on 12/30/2020. History of Afib Eliquis has been restarted.  Plan for vac change tomorrow with DR. Kenlei Safi at bedside Supplies ordered to bedside to include both black sponge and white sponge kits. IV antibiotics continued, Vancomycin   Subjective  -  painful right UE    Objective 138/74 64 98.3 F (36.8 C) (Oral) 16 97%  Intake/Output Summary (Last 24 hours) at 01/02/2021 0659 Last data filed at 01/01/2021 2133 Gross per 24 hour  Intake 224.33 ml  Output 1108 ml  Net -883.67 ml    Right UE with intact wound vac to suction OP 75 cc Minimal motor secondary to pain Palpable radial pulse, no palpable thrill in UE AV fistula, TDC in place. Lungs non labored breathing    Roxy Horseman 01/02/2021 6:59 AM --  Laboratory Lab Results: Recent Labs    01/01/21 0059 01/02/21 0349  WBC 7.3 4.3  HGB 10.2* 9.0*  HCT 32.2* 29.5*  PLT 179 142*   BMET Recent Labs    01/01/21 0059 01/02/21 0349  NA 133* 139  K 4.8 3.9  CL 98 105  CO2 22 27  GLUCOSE 134* 83  BUN 51* 23  CREATININE 9.17* 5.26*  CALCIUM 8.5* 8.1*    COAG Lab Results  Component Value Date   INR 1.14 12/28/2011   INR 1.10 12/13/2010   INR 1.10 01/26/2010   No results found for: PTT   I have independently interviewed and examined patient and agree with PA assessment and plan above.  Plan to change wound VAC tomorrow and will need set up as outpatient.  Felis Quillin C. Donzetta Matters, MD [facility] [place] Office: [phone] Pager: [phone]

## 2021-01-03 DIAGNOSIS — B20 Human immunodeficiency virus [HIV] disease: Secondary | ICD-10-CM | POA: Diagnosis not present

## 2021-01-03 DIAGNOSIS — N186 End stage renal disease: Secondary | ICD-10-CM | POA: Diagnosis not present

## 2021-01-03 DIAGNOSIS — L089 Local infection of the skin and subcutaneous tissue, unspecified: Secondary | ICD-10-CM | POA: Diagnosis not present

## 2021-01-03 DIAGNOSIS — I10 Essential (primary) hypertension: Secondary | ICD-10-CM | POA: Diagnosis not present

## 2021-01-03 LAB — CBC
HCT: 30.3 % — ABNORMAL LOW (ref 36.0–46.0)
Hemoglobin: 9.2 g/dL — ABNORMAL LOW (ref 12.0–15.0)
MCH: 30.6 pg (ref 26.0–34.0)
MCHC: 30.4 g/dL (ref 30.0–36.0)
MCV: 100.7 fL — ABNORMAL HIGH (ref 80.0–100.0)
Platelets: 159 10*3/uL (ref 150–400)
RBC: 3.01 MIL/uL — ABNORMAL LOW (ref 3.87–5.11)
RDW: 18.7 % — ABNORMAL HIGH (ref 11.5–15.5)
WBC: 4.3 10*3/uL (ref 4.0–10.5)
nRBC: 0 % (ref 0.0–0.2)

## 2021-01-03 LAB — MAGNESIUM: Magnesium: 2.1 mg/dL (ref 1.7–2.4)

## 2021-01-03 LAB — GLUCOSE, CAPILLARY
Glucose-Capillary: 76 mg/dL (ref 70–99)
Glucose-Capillary: 78 mg/dL (ref 70–99)
Glucose-Capillary: 81 mg/dL (ref 70–99)
Glucose-Capillary: 85 mg/dL (ref 70–99)
Glucose-Capillary: 86 mg/dL (ref 70–99)
Glucose-Capillary: 92 mg/dL (ref 70–99)
Glucose-Capillary: 93 mg/dL (ref 70–99)

## 2021-01-03 LAB — BASIC METABOLIC PANEL
Anion gap: 7 (ref 5–15)
BUN: 12 mg/dL (ref 8–23)
CO2: 27 mmol/L (ref 22–32)
Calcium: 8.8 mg/dL — ABNORMAL LOW (ref 8.9–10.3)
Chloride: 99 mmol/L (ref 98–111)
Creatinine, Ser: 3.89 mg/dL — ABNORMAL HIGH (ref 0.44–1.00)
GFR, Estimated: 12 mL/min — ABNORMAL LOW (ref >60)
Glucose, Bld: 79 mg/dL (ref 70–99)
Potassium: 3.7 mmol/L (ref 3.5–5.1)
Sodium: 133 mmol/L — ABNORMAL LOW (ref 135–145)

## 2021-01-03 NOTE — Progress Notes (Signed)
OT Cancellation Note  Patient Details Name: KATHLEENE BERGEMANN MRN: [id number] DOB: 1951/01/14   Cancelled Treatment:    Reason Eval/Treat Not Completed: Patient at procedure or test/ unavailable (having wound vac changed)  Liza Czerwinski,HILLARY 01/03/2021, 9:10 AM Maurie Boettcher, OT/L   Acute OT Clinical Specialist [place] Pager [phone] Office [phone]

## 2021-01-03 NOTE — Progress Notes (Signed)
PROGRESS NOTE    [name]  WLN:[id number] DOB: 1950-04-27 DOA: 12/24/2020 PCP: Hilbert Corrigan, MD   Brief Narrative:  70 year old with history of ESRD on HD Tuesday Thursday Saturday, HIV, a flutter on Eliquis comes to the hospital with complaints of right upper extremity AV fistula infection.  She had an AV fistula occlusion about 4 weeks ago and underwent balloon angioplasty of the right subclavian and right innominate vein with conversion to right forearm fistula to right upper extremity fistula.  She initially went to [facility] where she was diagnosed with AV fistula infection and transferred to [facility].  Vascular, nephro, IR and infectious disease are consulted.  Patient underwent ligation of AV fistula and I&D on 12/27/2020.  I have placed a tunneled catheter 10/31.  Patient returned to the OR by vascular 11/1 for washout and wound VAC placement.  Wound VAC changed on 11/4, planning to involve plastic surgery if necessary.    Assessment & Plan:   Active Problems:   Human immunodeficiency virus (HIV) disease ([place])   Essential hypertension   ESRD on dialysis [facility])   Atrial flutter ([place])   Anemia of chronic disease   Infected fluid collection with fistula   Hypoglycemia   Right AV fistula infection now with purulent discharge - Vascular performed I&D/ligation of AV fistula on 12/19/2020.  Status post repeat or visit for washout and wound VAC placement on 12/31/2020.  Wound VAC/dressing changed by vascular today, another change being planned on Monday and possibility of plastic surgery involvement. She really needs to improve her nutrition to help with the healing process -ID planning for Vanc with HD for total of 6 weeks. Last day 02/12/21 - Pain control, dry dressing.  ESRD on hemodialysis TTS -Temporary HD removed and tunneled cath placed on 10/31.  HD per nephro  Hyperkalemia -Resolved.    Atrial flutter/fibrillation.  Rate better  controlled -Continue metoprolol to 50mg  BID.  Cleared by vascular to transition her back to Eliquis.  History of HIV - CD4 count 252, continue antiretrovirals  Anemia of chronic disease - Baseline hemoglobin 8.0.  Continue to monitor  History of chronic pain syndrome - On fentanyl patch and methadone  Severe protein calorie malnutrition -encourage oral intake. May end up needing adv feeding tube but would avoid it for now in the setting of active infection.   DVT prophylaxis: Eliquis Code Status: Full Family Communication:  None  Status is: Inpatient  Remains inpatient appropriate because: Wound management per vascular team.  Discharge to SNF once cleared by their service.   Nutritional status  Nutrition Problem: Severe Malnutrition Etiology: chronic illness (ESRD on HD)  Signs/Symptoms: severe fat depletion, severe muscle depletion  Interventions: Snacks, MVI, Other (Comment) (double protein)  Body mass index is 17.74 kg/m.       Subjective: Patient is doing okay no complaints.  Wound VAC was changed by vascular team this morning.  She tolerated this.  Review of systems: General = no fevers, chills, dizziness,  fatigue HEENT/EYES = negative for loss of vision, double vision, blurred vision,  sore throa Cardiovascular= negative for chest pain, palpitation Respiratory/lungs= negative for shortness of breath, cough, wheezing; hemoptysis,  Gastrointestinal= negative for nausea, vomiting, abdominal pain Genitourinary= negative for Dysuria MSK = Negative for arthralgia, myalgias Neurology= Negative for headache, numbness, tingling  Psychiatry= Negative for suicidal and homocidal ideation Skin= Negative for Rash     Examination:  Constitutional: Not in acute distress, cachectic frail with bilateral temporal wasting. Respiratory:  Clear to auscultation bilaterally Cardiovascular: Normal sinus rhythm, no rubs Abdomen: Nontender nondistended good bowel  sounds Musculoskeletal: No edema noted Skin: No rashes seen Neurologic: CN 2-12 grossly intact.  And nonfocal Psychiatric: Normal judgment and insight. Alert and oriented x 3. Normal mood.    Wound vac in place RUE Objective: Vitals:   01/02/21 1735 01/02/21 2147 01/03/21 0506 01/03/21 0947  BP: 118/70 (!) 141/81 (!) 152/91 (!) 154/83  Pulse: 68 67 62 (!) 51  Resp: 16 16 16 17   Temp: 98.8 F (37.1 C) 98.4 F (36.9 C) 98.3 F (36.8 C) 97.9 F (36.6 C)  TempSrc: Oral Oral    SpO2: 99% 97% 100% 98%  Weight:      Height:        Intake/Output Summary (Last 24 hours) at 01/03/2021 1147 Last data filed at 01/03/2021 1001 Gross per 24 hour  Intake 160 ml  Output 500 ml  Net -340 ml   Filed Weights   01/01/21 2133 01/02/21 0852 01/02/21 1251  Weight: 45.3 kg 45.2 kg 44.7 kg     Data Reviewed:   CBC: Recent Labs  Lab 12/30/20 0503 12/31/20 0806 01/01/21 0059 01/02/21 0349 01/03/21 0503  WBC 6.5 5.2 7.3 4.3 4.3  HGB 10.0* 10.1* 10.2* 9.0* 9.2*  HCT 32.1* 32.4* 32.2* 29.5* 30.3*  MCV 99.4 99.4 99.1 101.4* 100.7*  PLT 147* 151 179 142* 831   Basic Metabolic Panel: Recent Labs  Lab 12/28/20 0735 12/29/20 1022 12/30/20 0503 12/30/20 1313 12/31/20 0806 01/01/21 0059 01/02/21 0349 01/03/21 0503  NA 137  --  136  --  133* 133* 139 133*  K 4.0  --  3.4*  --  3.9 4.8 3.9 3.7  CL 100  --  102  --  98 98 105 99  CO2 25  --  24  --  22 22 27 27   GLUCOSE 77  --  71  --  81 134* 83 79  BUN 40*  --  33*  --  43* 51* 23 12  CREATININE 7.35*  --  6.86*  --  8.25* 9.17* 5.26* 3.89*  CALCIUM 9.3  --  8.2*  --  8.7* 8.5* 8.1* 8.8*  MG  --    < > 2.0  --  2.1 2.2 2.0 2.1  PHOS 7.6*  --   --  5.0*  --   --   --   --    < > = values in this interval not displayed.   GFR: Estimated Creatinine Clearance: 9.9 mL/min (A) (by C-G formula based on SCr of 3.89 mg/dL (H)). Liver Function Tests: Recent Labs  Lab 12/28/20 0735  ALBUMIN 2.3*   No results for input(s): LIPASE,  AMYLASE in the last 168 hours. No results for input(s): AMMONIA in the last 168 hours. Coagulation Profile: No results for input(s): INR, PROTIME in the last 168 hours. Cardiac Enzymes: No results for input(s): CKTOTAL, CKMB, CKMBINDEX, TROPONINI in the last 168 hours. BNP (last 3 results) No results for input(s): PROBNP in the last 8760 hours. HbA1C: No results for input(s): HGBA1C in the last 72 hours. CBG: Recent Labs  Lab 01/02/21 1631 01/02/21 2030 01/03/21 0003 01/03/21 0429 01/03/21 0607  GLUCAP 133* 96 78 86 76   Lipid Profile: No results for input(s): CHOL, HDL, LDLCALC, TRIG, CHOLHDL, LDLDIRECT in the last 72 hours. Thyroid Function Tests: No results for input(s): TSH, T4TOTAL, FREET4, T3FREE, THYROIDAB in the last 72 hours. Anemia Panel: No results for input(s): VITAMINB12,  FOLATE, FERRITIN, TIBC, IRON, RETICCTPCT in the last 72 hours. Sepsis Labs: No results for input(s): PROCALCITON, LATICACIDVEN in the last 168 hours.   Recent Results (from the past 240 hour(s))  Resp Panel by RT-PCR (Flu A&B, Covid) Nasopharyngeal Swab     Status: None   Collection Time: 12/24/20  2:53 PM   Specimen: Nasopharyngeal Swab; Nasopharyngeal(NP) swabs in vial transport medium  Result Value Ref Range Status   SARS Coronavirus 2 by RT PCR NEGATIVE NEGATIVE Final    Comment: (NOTE) SARS-CoV-2 target nucleic acids are NOT DETECTED.  The SARS-CoV-2 RNA is generally detectable in upper respiratory specimens during the acute phase of infection. The lowest concentration of SARS-CoV-2 viral copies this assay can detect is 138 copies/mL. A negative result does not preclude SARS-Cov-2 infection and should not be used as the sole basis for treatment or other patient management decisions. A negative result may occur with  improper specimen collection/handling, submission of specimen other than nasopharyngeal swab, presence of viral mutation(s) within the areas targeted by this assay, and  inadequate number of viral copies(<138 copies/mL). A negative result must be combined with clinical observations, patient history, and epidemiological information. The expected result is Negative.  Fact Sheet for Patients:  EntrepreneurPulse.com.au  Fact Sheet for Healthcare Providers:  IncredibleEmployment.be  This test is no t yet approved or cleared by the [place] FDA and  has been authorized for detection and/or diagnosis of SARS-CoV-2 by FDA under an Emergency Use Authorization (EUA). This EUA will remain  in effect (meaning this test can be used) for the duration of the COVID-19 declaration under Section 564(b)(1) of the Act, 21 U.S.C.section 360bbb-3(b)(1), unless the authorization is terminated  or revoked sooner.       Influenza A by PCR NEGATIVE NEGATIVE Final   Influenza B by PCR NEGATIVE NEGATIVE Final    Comment: (NOTE) The Xpert Xpress SARS-CoV-2/FLU/RSV plus assay is intended as an aid in the diagnosis of influenza from Nasopharyngeal swab specimens and should not be used as a sole basis for treatment. Nasal washings and aspirates are unacceptable for Xpert Xpress SARS-CoV-2/FLU/RSV testing.  Fact Sheet for Patients: EntrepreneurPulse.com.au  Fact Sheet for Healthcare Providers: IncredibleEmployment.be  This test is not yet approved or cleared by the [place] FDA and has been authorized for detection and/or diagnosis of SARS-CoV-2 by FDA under an Emergency Use Authorization (EUA). This EUA will remain in effect (meaning this test can be used) for the duration of the COVID-19 declaration under Section 564(b)(1) of the Act, 21 U.S.C. section 360bbb-3(b)(1), unless the authorization is terminated or revoked.  Performed at [facility], [address], [place], [place] [address]   Blood culture (routine x 2)     Status: None   Collection Time: 12/24/20  3:19 PM   Specimen: BLOOD  LEFT HAND  Result Value Ref Range Status   Specimen Description BLOOD LEFT HAND  Final   Special Requests   Final    BOTTLES DRAWN AEROBIC AND ANAEROBIC Blood Culture adequate volume   Culture   Final    NO GROWTH 5 DAYS Performed at [facility], [address] [place], [place] [address]    Report Status 12/29/2020 FINAL  Final  Blood culture (routine x 2)     Status: None   Collection Time: 12/24/20  3:19 PM   Specimen: BLOOD LEFT ARM  Result Value Ref Range Status   Specimen Description BLOOD LEFT ARM  Final   Special Requests   Final  BOTTLES DRAWN AEROBIC AND ANAEROBIC Blood Culture adequate volume   Culture   Final    NO GROWTH 5 DAYS Performed at [facility], [address], [place], [place] [address]    Report Status 12/29/2020 FINAL  Final  Surgical pcr screen     Status: None   Collection Time: 12/26/20 12:42 PM   Specimen: Nasal Mucosa; Nasal Swab  Result Value Ref Range Status   MRSA, PCR NEGATIVE NEGATIVE Final   Staphylococcus aureus NEGATIVE NEGATIVE Final    Comment: (NOTE) The Xpert SA Assay (FDA approved for NASAL specimens in patients 20 years of age and older), is one component of a comprehensive surveillance program. It is not intended to diagnose infection nor to guide or monitor treatment. Performed at [facility], [place] [address], [place], [place] [address]   Aerobic Culture w Gram Stain (superficial specimen)     Status: None   Collection Time: 12/27/20 12:19 PM   Specimen: Abscess; Wound  Result Value Ref Range Status   Specimen Description ABSCESS  Final   Special Requests ARM RIGHT  Final   Gram Stain   Final    FEW WBC PRESENT,BOTH PMN AND MONONUCLEAR FEW GRAM POSITIVE COCCI IN PAIRS Performed at [facility], [address] [place] [address], [place], [place] [address]    Culture   Final    MODERATE METHICILLIN RESISTANT STAPHYLOCOCCUS AUREUS   Report Status 12/29/2020 FINAL  Final   Organism ID, Bacteria METHICILLIN RESISTANT STAPHYLOCOCCUS  AUREUS  Final      Susceptibility   Methicillin resistant staphylococcus aureus - MIC*    CIPROFLOXACIN >=8 RESISTANT Resistant     ERYTHROMYCIN >=8 RESISTANT Resistant     GENTAMICIN <=0.5 SENSITIVE Sensitive     OXACILLIN >=4 RESISTANT Resistant     TETRACYCLINE <=1 SENSITIVE Sensitive     VANCOMYCIN 1 SENSITIVE Sensitive     TRIMETH/SULFA <=10 SENSITIVE Sensitive     CLINDAMYCIN <=0.25 SENSITIVE Sensitive     RIFAMPIN <=0.5 SENSITIVE Sensitive     Inducible Clindamycin NEGATIVE Sensitive     * MODERATE METHICILLIN RESISTANT STAPHYLOCOCCUS AUREUS  Aerobic/Anaerobic Culture w Gram Stain (surgical/deep wound)     Status: None   Collection Time: 12/27/20  2:39 PM   Specimen: Wound; Tissue  Result Value Ref Range Status   Specimen Description TISSUE  Final   Special Requests RIGHT ARM WOUND FOR CULTURES SPEC A  Final   Gram Stain   Final    RARE WBC PRESENT, PREDOMINANTLY PMN RARE GRAM POSITIVE COCCI IN PAIRS    Culture   Final    RARE METHICILLIN RESISTANT STAPHYLOCOCCUS AUREUS NO ANAEROBES ISOLATED Performed at [facility], [place] [address] [place], [place] [address]    Report Status 01/01/2021 FINAL  Final   Organism ID, Bacteria METHICILLIN RESISTANT STAPHYLOCOCCUS AUREUS  Final      Susceptibility   Methicillin resistant staphylococcus aureus - MIC*    CIPROFLOXACIN >=8 RESISTANT Resistant     ERYTHROMYCIN >=8 RESISTANT Resistant     GENTAMICIN <=0.5 SENSITIVE Sensitive     OXACILLIN >=4 RESISTANT Resistant     TETRACYCLINE <=1 SENSITIVE Sensitive     VANCOMYCIN 1 SENSITIVE Sensitive     TRIMETH/SULFA <=10 SENSITIVE Sensitive     CLINDAMYCIN <=0.25 SENSITIVE Sensitive     RIFAMPIN <=0.5 SENSITIVE Sensitive     Inducible Clindamycin NEGATIVE Sensitive     * RARE METHICILLIN RESISTANT STAPHYLOCOCCUS AUREUS         Radiology  Studies: No results found.      Scheduled Meds:  apixaban  2.5 mg Oral BID   calcitRIOL  1.25 mcg Oral Q T,Th,Sa-HD    Chlorhexidine Gluconate Cloth  6 each Topical Q0600   cinacalcet  60 mg Oral Q T,Th,Sa-HD   darbepoetin (ARANESP) injection - DIALYSIS  200 mcg Intravenous Q Thu-HD   dolutegravir  50 mg Oral q1600   fentaNYL  1 patch Transdermal Q72H   lamiVUDine  50 mg Oral Daily   methadone  2.5 mg Oral Q2000   metoprolol tartrate  50 mg Oral BID   mirtazapine  15 mg Oral QHS   multivitamin  1 tablet Oral QHS   pantoprazole  40 mg Oral QAC breakfast   vancomycin variable dose per unstable renal function (pharmacist dosing)   Does not apply See admin instructions   venlafaxine XR  150 mg Oral Q breakfast   zidovudine  300 mg Oral Daily   Continuous Infusions:     LOS: 10 days   Time spent= 35 mins    Jes Costales Arsenio Loader, MD [facility]  If 7PM-7AM, please contact night-coverage  01/03/2021, 11:47 AM

## 2021-01-03 NOTE — Plan of Care (Signed)
Patient's dialysis unit is  [facility]) Confirmed with staff  Aware that patient will need to continue vancomycin with HD until 02/12/21. Will send final dosing with discharge orders.    Lynnda Child PA-C [facility] 01/03/2021,3:38 PM

## 2021-01-03 NOTE — Plan of Care (Signed)
  Problem: Education: Goal: Knowledge of General Education information will improve Description: Including pain rating scale, medication(s)/side effects and non-pharmacologic comfort measures Outcome: Progressing   Problem: Clinical Measurements: Goal: Will remain free from infection Outcome: Progressing   Problem: Activity: Goal: Risk for activity intolerance will decrease Outcome: Progressing   Problem: Nutrition: Goal: Adequate nutrition will be maintained Outcome: Progressing   Problem: Coping: Goal: Level of anxiety will decrease Outcome: Progressing   

## 2021-01-03 NOTE — Progress Notes (Addendum)
  Progress Note    01/03/2021 8:56 AM 3 Days Post-Op  Subjective:  no complaints  afebrile  Vitals:   01/02/21 2147 01/03/21 0506  BP: (!) 141/81 (!) 152/91  Pulse: 67 62  Resp: 16 16  Temp: 98.4 F (36.9 C) 98.3 F (36.8 C)  SpO2: 97% 100%    Physical Exam: General:  no distress Lungs:  non labored Incisions:       CBC    Component Value Date/Time   WBC 4.3 01/03/2021 0503   RBC 3.01 (L) 01/03/2021 0503   HGB 9.2 (L) 01/03/2021 0503   HCT 30.3 (L) 01/03/2021 0503   PLT 159 01/03/2021 0503   MCV 100.7 (H) 01/03/2021 0503   MCH 30.6 01/03/2021 0503   MCHC 30.4 01/03/2021 0503   RDW 18.7 (H) 01/03/2021 0503   LYMPHSABS 1.1 12/25/2020 0409   MONOABS 0.7 12/25/2020 0409   EOSABS 0.1 12/25/2020 0409   BASOSABS 0.0 12/25/2020 0409    BMET    Component Value Date/Time   NA 133 (L) 01/03/2021 0503   K 3.7 01/03/2021 0503   CL 99 01/03/2021 0503   CO2 27 01/03/2021 0503   GLUCOSE 79 01/03/2021 0503   BUN 12 01/03/2021 0503   CREATININE 3.89 (H) 01/03/2021 0503   CREATININE 5.66 (H) 12/22/2019 1117   CALCIUM 8.8 (L) 01/03/2021 0503   CALCIUM 9.0 03/12/2011 0527   GFRNONAA 12 (L) 01/03/2021 0503   GFRNONAA 5 (L) 02/12/2015 1000   GFRAA 9 (L) 10/22/2019 0105   GFRAA 5 (L) 02/12/2015 1000    INR    Component Value Date/Time   INR 1.14 12/28/2011 1403     Intake/Output Summary (Last 24 hours) at 01/03/2021 0856 Last data filed at 01/02/2021 2100 Gross per 24 hour  Intake 60 ml  Output 500 ml  Net -440 ml     Assessment/Plan:  70 y.o. female is s/p:  washout of right arm wound and placement of wound vac  3 Days Post-Op   -pt tolerated vac change very well.  Wound bed looks healthy.  Will change vac again on Monday.  Most likely will need plastic surgery consult for coverage.     Leontine Locket, PA-C [facility] [phone] 01/03/2021 8:56 AM  I have independently interviewed and examined patient and agree with PA  assessment and plan above. Will change vac again on Monday. If no granulation around avf stump will involve plastic surgery. Overall her RUE appears much improved.   Taiwana Willison C. Donzetta Matters, MD [facility] [place] Office: [phone] Pager: [phone]

## 2021-01-03 NOTE — Progress Notes (Signed)
Occupational Therapy Treatment Patient Details Name: [name] MRN: [id number] DOB: 11-17-1950 Today's Date: 01/03/2021   History of present illness 70 y.o. female presents to [facility] hospital on 12/24/2020 with RUE fistula infection. Pt underwent temporary HDU catheter placement on 12/26/2020. S/p ligation and debridement 12/27/2020. PMH includes ESRD on HDU TTS, HIV, a flutter.   OT comments  Pt seen for PROM and AROM of Rt UE as well as was instructed in initial HEP.  Pt demonstrates improved ROM of hand, wrist, and elbow. Will continue to follow.    Recommendations for follow up therapy are one component of a multi-disciplinary discharge planning process, led by the attending physician.  Recommendations may be updated based on patient status, additional functional criteria and insurance authorization.    Follow Up Recommendations  Long-term institutional care without follow-up therapy    Assistance Recommended at Discharge Frequent or constant Supervision/Assistance  Equipment Recommendations  None recommended by OT    Recommendations for Other Services      Precautions / Restrictions Precautions Precautions: Fall Precaution Comments: R arm wound vac       Mobility Bed Mobility                    Transfers                         Balance                                           ADL either performed or assessed with clinical judgement   ADL                                               Vision       Perception     Praxis      Cognition Arousal/Alertness: Awake/alert Behavior During Therapy: WFL for tasks assessed/performed Overall Cognitive Status: No family/caregiver present to determine baseline cognitive functioning                                            Exercises Exercises: Other exercises Other Exercises Other Exercises: PROM with overpressure stretch to MCPs, and PIPs  as well as composite flexion of all digits followed by AROM of digits.  Pt instructed in AROM exercises for hand Other Exercises: PROM with overpressure for wrist extension as well as prolonged stretch - achieved ~25* wrist extension.  Followed by AROM of wrist flex/ext.  Pt was instructed in self ROM of wrist - she was able to achieve ~10* pssively, and wrist to neutral actively Other Exercises: 10 reps supination/pronation and instructed in AROM Other Exercises: Passive stretch Rt elbow - achieved ~-15* Followed by AROM of elbow - pt instructed in elbow extension exercises Other Exercises: Pt instructed in positioning of Rt hand and wrist to keep wrist in neutral - she was able to demonstrate understanding   Shoulder Instructions       General Comments      Pertinent Vitals/ Pain       Pain Assessment: Faces Faces Pain Scale: Hurts even more Pain Location: with PROM of Rt  UE Pain Descriptors / Indicators: Sore Pain Intervention(s): Monitored during session  Home Living                                          Prior Functioning/Environment              Frequency  Min 2X/week        Progress Toward Goals  OT Goals(current goals can now be found in the care plan section)  Progress towards OT goals: Progressing toward goals  Acute Rehab OT Goals Patient Stated Goal: to be able to use Rt hand OT Goal Formulation: With patient Time For Goal Achievement: 01/17/21 Potential to Achieve Goals: Good ADL Goals Pt/caregiver will Perform Home Exercise Program: Increased ROM;Increased strength;Right Upper extremity;With written HEP provided;With Supervision Additional ADL Goal #1: Pt will be independent with positioning Rt UE  Plan Discharge plan remains appropriate    Co-evaluation                 AM-PAC OT "6 Clicks" Daily Activity     Outcome Measure   Help from another person eating meals?: A Little Help from another person taking care of  personal grooming?: A Little Help from another person toileting, which includes using toliet, bedpan, or urinal?: Total Help from another person bathing (including washing, rinsing, drying)?: Total Help from another person to put on and taking off regular upper body clothing?: Total Help from another person to put on and taking off regular lower body clothing?: Total 6 Click Score: 10    End of Session    OT Visit Diagnosis: Muscle weakness (generalized) (M62.81);Pain Pain - Right/Left: Right Pain - part of body: Arm   Activity Tolerance Patient tolerated treatment well   Patient Left in bed;with call bell/phone within reach   Nurse Communication Mobility status        Time: 6962-9528 OT Time Calculation (min): 18 min  Charges: OT General Charges $OT Visit: 1 Visit OT Treatments $Therapeutic Exercise: 8-22 mins  Nilsa Nutting., OTR/L Acute Rehabilitation Services Pager [phone] Office [phone]   Lucille Passy M 01/03/2021, 6:00 PM

## 2021-01-03 NOTE — TOC Progression Note (Addendum)
Transition of Care [facility]) - Progression Note    Patient Details  Name: [name] MRN: [id number] Date of Birth: 06/04/1950  Transition of [facility]) CM/SW Contact  Sharlet Salina Mila Homer, LCSW Phone Number: 01/03/2021, 3:31 PM  Clinical Narrative: Received secure chat from MD on 11/3 that patient may be ready for discharge in the next 48 hours. Call made to [facility] today (3:28 pm) and message left for Fraser Din, admissions director that regarding patient's upcoming discharge. CSW will continue to follow and provide SW intervention services as needed through discharge.         Expected Discharge Plan: Skilled Nursing Facility Barriers to Discharge: Continued Medical Work up  Expected Discharge Plan and Services Expected Discharge Plan: [place] Choice: [place] Living arrangements for the past 2 months: [place]                                       Social Determinants of Health (SDOH) Interventions    Readmission Risk Interventions Readmission Risk Prevention Plan 10/22/2019  Transportation Screening Complete  PCP or Specialist Appt within 3-5 Days Not Complete  Not Complete comments SNF MD to follow  [place] or [place] Not Complete  HRI or Home Care Consult comments Pt resides in a SNF  Social Work Consult for [place] Planning/Counseling Complete  Palliative Care Screening Not Applicable  Medication Review [employer]) Complete  Some recent data might be hidden

## 2021-01-03 NOTE — Progress Notes (Signed)
Patient ID: [name], female   DOB: 05/24/1950, 70 y.o.   MRN: [id number] S: Patient feels well today with no complaints.  Tolerated dialysis yesterday without any issues.   O:BP (!) 154/83 (BP Location: Right Leg)   Pulse (!) 51   Temp 97.9 F (36.6 C)   Resp 17   Ht 5' 2.5" (1.588 m)   Wt 44.7 kg   LMP  (LMP Unknown)   SpO2 98%   BMI 17.74 kg/m   Intake/Output Summary (Last 24 hours) at 01/03/2021 1222 Last data filed at 01/03/2021 1001 Gross per 24 hour  Intake 160 ml  Output 500 ml  Net -340 ml   Intake/Output: I/O last 3 completed shifts: In: 39 [P.O.:60] Out: 500 [Other:500]  Intake/Output this shift:  Total I/O In: 100 [P.O.:100] Out: -  Weight change: -0.9 kg Gen:NAD CVS: Normal rate, no obvious rub Resp: Bilateral chest rise with no increased work of breathing Abd: +BS,s oft, NT/ND Ext: RUE with wound VAC in place  Recent Labs  Lab 12/28/20 0735 12/30/20 0503 12/30/20 1313 12/31/20 0806 01/01/21 0059 01/02/21 0349 01/03/21 0503  NA 137 136  --  133* 133* 139 133*  K 4.0 3.4*  --  3.9 4.8 3.9 3.7  CL 100 102  --  98 98 105 99  CO2 25 24  --  22 22 27 27   GLUCOSE 77 71  --  81 134* 83 79  BUN 40* 33*  --  43* 51* 23 12  CREATININE 7.35* 6.86*  --  8.25* 9.17* 5.26* 3.89*  ALBUMIN 2.3*  --   --   --   --   --   --   CALCIUM 9.3 8.2*  --  8.7* 8.5* 8.1* 8.8*  PHOS 7.6*  --  5.0*  --   --   --   --    Liver Function Tests: Recent Labs  Lab 12/28/20 0735  ALBUMIN 2.3*   No results for input(s): LIPASE, AMYLASE in the last 168 hours. No results for input(s): AMMONIA in the last 168 hours. CBC: Recent Labs  Lab 12/30/20 0503 12/31/20 0806 01/01/21 0059 01/02/21 0349 01/03/21 0503  WBC 6.5 5.2 7.3 4.3 4.3  HGB 10.0* 10.1* 10.2* 9.0* 9.2*  HCT 32.1* 32.4* 32.2* 29.5* 30.3*  MCV 99.4 99.4 99.1 101.4* 100.7*  PLT 147* 151 179 142* 159   Cardiac Enzymes: No results for input(s): CKTOTAL, CKMB, CKMBINDEX, TROPONINI in the last 168  hours. CBG: Recent Labs  Lab 01/02/21 2030 01/03/21 0003 01/03/21 0429 01/03/21 0607 01/03/21 1154  GLUCAP 96 78 86 76 81    Iron Studies: No results for input(s): IRON, TIBC, TRANSFERRIN, FERRITIN in the last 72 hours. Studies/Results: No results found.  apixaban  2.5 mg Oral BID   calcitRIOL  1.25 mcg Oral Q T,Th,Sa-HD   Chlorhexidine Gluconate Cloth  6 each Topical Q0600   cinacalcet  60 mg Oral Q T,Th,Sa-HD   darbepoetin (ARANESP) injection - DIALYSIS  200 mcg Intravenous Q Thu-HD   dolutegravir  50 mg Oral q1600   fentaNYL  1 patch Transdermal Q72H   lamiVUDine  50 mg Oral Daily   methadone  2.5 mg Oral Q2000   metoprolol tartrate  50 mg Oral BID   mirtazapine  15 mg Oral QHS   multivitamin  1 tablet Oral QHS   pantoprazole  40 mg Oral QAC breakfast   vancomycin variable dose per unstable renal function (pharmacist dosing)   Does not apply  See admin instructions   venlafaxine XR  150 mg Oral Q breakfast   zidovudine  300 mg Oral Daily    BMET    Component Value Date/Time   NA 133 (L) 01/03/2021 0503   K 3.7 01/03/2021 0503   CL 99 01/03/2021 0503   CO2 27 01/03/2021 0503   GLUCOSE 79 01/03/2021 0503   BUN 12 01/03/2021 0503   CREATININE 3.89 (H) 01/03/2021 0503   CREATININE 5.66 (H) 12/22/2019 1117   CALCIUM 8.8 (L) 01/03/2021 0503   CALCIUM 9.0 03/12/2011 0527   GFRNONAA 12 (L) 01/03/2021 0503   GFRNONAA 5 (L) 02/12/2015 1000   GFRAA 9 (L) 10/22/2019 0105   GFRAA 5 (L) 02/12/2015 1000   CBC    Component Value Date/Time   WBC 4.3 01/03/2021 0503   RBC 3.01 (L) 01/03/2021 0503   HGB 9.2 (L) 01/03/2021 0503   HCT 30.3 (L) 01/03/2021 0503   PLT 159 01/03/2021 0503   MCV 100.7 (H) 01/03/2021 0503   MCH 30.6 01/03/2021 0503   MCHC 30.4 01/03/2021 0503   RDW 18.7 (H) 01/03/2021 0503   LYMPHSABS 1.1 12/25/2020 0409   MONOABS 0.7 12/25/2020 0409   EOSABS 0.1 12/25/2020 0409   BASOSABS 0.0 12/25/2020 0409    Dialysis Orders: Center: [facility]   on TTS . EDW 41.5kg HD Bath 2K/2.5Ca  Time 3 hours Heparin 1000 units bolus then 500 units/hr. 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  appreciate vascular surgery evaluation-  s/p ligation and debridement 10/28.  Now with wound VAC in place.  On vanc until December 14. MRSA on culture.  We will communicate to her dialysis unit that she will need vancomycin until December 14.  ESRD -continue dialysis.  Continue TTS schedule.  We will arrange outpatient antibiotics.  Stable for discharge from nephrology standpoint whenever she gets her placement set up.  Hypertension/volume  - stable- no BP meds-  Uf as able-  has prespecified max of 3.5 liters  Anemia  - Hb 9s. continue with ESA.    Metabolic bone disease -  continue with calcitriol and sensipar-phosphorus level 5  Nutrition - renal diet. AFib/FL:  improved with increased metoprolol dose HIV on meds   [name]

## 2021-01-04 DIAGNOSIS — N186 End stage renal disease: Secondary | ICD-10-CM | POA: Diagnosis not present

## 2021-01-04 DIAGNOSIS — L089 Local infection of the skin and subcutaneous tissue, unspecified: Secondary | ICD-10-CM | POA: Diagnosis not present

## 2021-01-04 DIAGNOSIS — D638 Anemia in other chronic diseases classified elsewhere: Secondary | ICD-10-CM | POA: Diagnosis not present

## 2021-01-04 DIAGNOSIS — I4892 Unspecified atrial flutter: Secondary | ICD-10-CM | POA: Diagnosis not present

## 2021-01-04 LAB — BASIC METABOLIC PANEL
Anion gap: 7 (ref 5–15)
BUN: 21 mg/dL (ref 8–23)
CO2: 27 mmol/L (ref 22–32)
Calcium: 9 mg/dL (ref 8.9–10.3)
Chloride: 97 mmol/L — ABNORMAL LOW (ref 98–111)
Creatinine, Ser: 5.57 mg/dL — ABNORMAL HIGH (ref 0.44–1.00)
GFR, Estimated: 8 mL/min — ABNORMAL LOW (ref >60)
Glucose, Bld: 99 mg/dL (ref 70–99)
Potassium: 4.2 mmol/L (ref 3.5–5.1)
Sodium: 131 mmol/L — ABNORMAL LOW (ref 135–145)

## 2021-01-04 LAB — GLUCOSE, CAPILLARY
Glucose-Capillary: 75 mg/dL (ref 70–99)
Glucose-Capillary: 67 mg/dL — ABNORMAL LOW (ref 70–99)
Glucose-Capillary: 106 mg/dL — ABNORMAL HIGH (ref 70–99)
Glucose-Capillary: 134 mg/dL — ABNORMAL HIGH (ref 70–99)
Glucose-Capillary: 85 mg/dL (ref 70–99)
Glucose-Capillary: 116 mg/dL — ABNORMAL HIGH (ref 70–99)

## 2021-01-04 LAB — CBC
HCT: 32.1 % — ABNORMAL LOW (ref 36.0–46.0)
Hemoglobin: 9.8 g/dL — ABNORMAL LOW (ref 12.0–15.0)
MCH: 31 pg (ref 26.0–34.0)
MCHC: 30.5 g/dL (ref 30.0–36.0)
MCV: 101.6 fL — ABNORMAL HIGH (ref 80.0–100.0)
Platelets: 174 10*3/uL (ref 150–400)
RBC: 3.16 MIL/uL — ABNORMAL LOW (ref 3.87–5.11)
RDW: 18.3 % — ABNORMAL HIGH (ref 11.5–15.5)
WBC: 4.8 10*3/uL (ref 4.0–10.5)
nRBC: 0 % (ref 0.0–0.2)

## 2021-01-04 LAB — MAGNESIUM: Magnesium: 2 mg/dL (ref 1.7–2.4)

## 2021-01-04 MED ORDER — VANCOMYCIN HCL 500 MG/100ML IV SOLN
500.0000 mg | Freq: Once | INTRAVENOUS | Status: DC
  Filled 2021-01-04: qty 100

## 2021-01-04 MED ORDER — HEPARIN SODIUM (PORCINE) 1000 UNIT/ML IJ SOLN
INTRAMUSCULAR | Status: AC
  Filled 2021-01-04: qty 1

## 2021-01-04 NOTE — Progress Notes (Signed)
ANTIBIOTIC CONSULT NOTE  Pharmacy Consult for vanc Indication: cath infection  Allergies  Allergen Reactions   Lactose Intolerance (Gi) Other (See Comments)    On MAR   Latex Rash and Itching   Penicillins Rash and Swelling    Has patient had a PCN reaction causing immediate rash, facial/tongue/throat swelling, SOB or lightheadedness with hypotension: No Has patient had a PCN reaction causing severe rash involving mucus membranes or skin necrosis: No Has patient had a PCN reaction that required hospitalization No Has patient had a PCN reaction occurring within the last 10 years: No If all of the above answers are "NO", then may proceed with Cephalosporin use.      Patient Measurements: Height: 5' 2.5" (158.8 cm) Weight: 42.4 kg (93 lb 7.6 oz) IBW/kg (Calculated) : 51.25 Heparin Dosing Weight: TBW  Vital Signs: Temp: 98.1 F (36.7 C) (11/05 0723) Temp Source: Oral (11/05 0723) BP: 155/98 (11/05 0900) Pulse Rate: 45 (11/05 0900)  Labs: Recent Labs    01/02/21 0349 01/03/21 0503 01/04/21 0436  HGB 9.0* 9.2* 9.8*  HCT 29.5* 30.3* 32.1*  PLT 142* 159 174  CREATININE 5.26* 3.89* 5.57*     Estimated Creatinine Clearance: 6.6 mL/min (A) (by C-G formula based on SCr of 5.57 mg/dL (H)).   Assessment: 77 yof admitted with an infected RUE fistula. Apixaban from PTA for Aflutter held on admit, last dose 10/24. She is s/p ligation right arm AV fistula and I&D right arm wound on 10/28. Per ID, patient will receive vancomycin for 6 weeks (through 12/14) followed by suppressive therapy.   Pt is s/p TDC>>Palindrome conversion. After getting 2 additional dose of vanc while on schedule vanc, random vanc level came back at 82 on 11/1. Patient received 3 hours of HD on 11/2 and 2.5h hours 11/3. Pre-HD level 11/3 was 44 mcg/ml.   Calculated vancomycin serum level based on previous HD sessions ~17 (within goal). Will plan on giving 500 mg IV x1 11/5 following HD and checking vancomycin  random level 11/6 to make sure she is not supratherapeutic. At that time, vancomycin doses may be rescheduled to be with each HD session.    Goal of Therapy:  PreHD vanc: 15-25   Plan:   Vancomycin 500 mg IV x1  F/U VR 11/6 [place], PharmD PGY-1 Acute Care Resident  01/04/2021 9:18 AM

## 2021-01-04 NOTE — Progress Notes (Signed)
Occupational Therapy Treatment Patient Details Name: [name] MRN: [id number] DOB: April 14, 1950 Today's Date: 01/04/2021   History of present illness 70 y.o. female presents to [facility] hospital on 12/24/2020 with RUE fistula infection. Pt underwent temporary HDU catheter placement on 12/26/2020. S/p ligation and debridement 12/27/2020. PMH includes ESRD on HDU TTS, HIV, a flutter.   OT comments  Patient seen by skilled OT to address HEP for RUE ROM exercises. Patient instructed in, and performed MP flexion and wrist extension AROM, wrist extension stretch, supination PROM, and elbow extension stretch in pronation as well as AROM for RUE shoulder flexion.  Patient also perform toileting to [facility] during treatment. Patient instructed to perform HEP daily and would benefit from further instructions. Acute OT to continue to follow.    Recommendations for follow up therapy are one component of a multi-disciplinary discharge planning process, led by the attending physician.  Recommendations may be updated based on patient status, additional functional criteria and insurance authorization.    Follow Up Recommendations  Long-term institutional care without follow-up therapy    Assistance Recommended at Discharge Frequent or constant Supervision/Assistance  Equipment Recommendations  None recommended by OT    Recommendations for Other Services      Precautions / Restrictions Precautions Precautions: Fall Precaution Comments: R arm wound vac Restrictions Weight Bearing Restrictions: No       Mobility Bed Mobility Overal bed mobility: Needs Assistance Bed Mobility: Supine to Sit;Sit to Supine     Supine to sit: Min assist Sit to supine: Min assist   General bed mobility comments: used rails to assist    Transfers Overall transfer level: Needs assistance Equipment used: Rolling walker (2 wheels);1 person hand held assist Transfers: Sit to/from [facility] Sit to  Stand: Min assist Stand pivot transfers: Min assist         General transfer comment: hand held assist to transfer to [facility] and RW to transfer back to EOB     Balance Overall balance assessment: Needs assistance Sitting-balance support: No upper extremity supported;Feet supported Sitting balance-Leahy Scale: Fair Sitting balance - Comments: able to sit on eob without assitance   Standing balance support: Bilateral upper extremity supported Standing balance-Leahy Scale: Poor Standing balance comment: stood for toilet hygiene                           ADL either performed or assessed with clinical judgement   ADL Overall ADL's : Needs assistance/impaired                         Toilet Transfer: Minimal assistance;Stand-pivot;BSC Toilet Transfer Details (indicate cue type and reason): performed toilet transfer from EOB to [place] and Hygiene: Maximal assistance Toileting - Clothing Manipulation Details (indicate cue type and reason): max assist for toilet hygiene while standing       General ADL Comments: patient was unable to reach buttocks to perform toilet hygiene     Vision       Perception     Praxis      Cognition Arousal/Alertness: Awake/alert Behavior During Therapy: WFL for tasks assessed/performed Overall Cognitive Status: No family/caregiver present to determine baseline cognitive functioning                                 General Comments: able to follow directions from HEP with mod  verbal cues to perform correctly          Exercises Exercises: Other exercises General Exercises - Upper Extremity Shoulder Flexion: AAROM;Right;10 reps;Seated Other Exercises Other Exercises: SROM for MCPs and PIPs stretch with wrist flexion/extension AROM following HEP. Other Exercises: instructions for supination stretching/ROM exercises to RUE following HEP Other Exercises: elbow flexion/extension AROM  exercises following HEP for 10 reps   Shoulder Instructions       General Comments      Pertinent Vitals/ Pain       Pain Assessment: Faces Faces Pain Scale: Hurts even more Pain Location: with PROM of Rt UE Pain Descriptors / Indicators: Sore Pain Intervention(s): Repositioned  Home Living                                          Prior Functioning/Environment              Frequency  Min 2X/week        Progress Toward Goals  OT Goals(current goals can now be found in the care plan section)  Progress towards OT goals: Progressing toward goals  Acute Rehab OT Goals OT Goal Formulation: With patient Time For Goal Achievement: 01/17/21 Potential to Achieve Goals: Good ADL Goals Pt Will Perform Eating: with set-up;sitting Pt Will Perform Grooming: sitting;with supervision Pt Will Perform Upper Body Bathing: with min assist;sitting Pt Will Perform Upper Body Dressing: with min assist;sitting Pt Will Transfer to Toilet: with mod assist;squat pivot transfer;bedside commode Pt Will Perform Toileting - Clothing Manipulation and hygiene: with mod assist;sitting/lateral leans Pt/caregiver will Perform Home Exercise Program: Increased ROM;Increased strength;Right Upper extremity;With written HEP provided;With Supervision Additional ADL Goal #1: Pt will be independent with positioning Rt UE  Plan Discharge plan remains appropriate    Co-evaluation                 AM-PAC OT "6 Clicks" Daily Activity     Outcome Measure   Help from another person eating meals?: A Little Help from another person taking care of personal grooming?: A Little Help from another person toileting, which includes using toliet, bedpan, or urinal?: Total Help from another person bathing (including washing, rinsing, drying)?: Total Help from another person to put on and taking off regular upper body clothing?: Total Help from another person to put on and taking off regular  lower body clothing?: Total 6 Click Score: 10    End of Session Equipment Utilized During Treatment: Rolling walker (2 wheels)  OT Visit Diagnosis: Muscle weakness (generalized) (M62.81);Pain Pain - Right/Left: Right Pain - part of body: Arm   Activity Tolerance Patient tolerated treatment well   Patient Left in bed;with call bell/phone within reach   Nurse Communication Mobility status        Time: 6945-0388 OT Time Calculation (min): 31 min  Charges: OT General Charges $OT Visit: 1 Visit OT Treatments $Self Care/Home Management : 8-22 mins $Therapeutic Exercise: 8-22 mins  Lodema Hong, [place]  Pager [phone] Office [place] 01/04/2021, 1:48 PM

## 2021-01-04 NOTE — Progress Notes (Signed)
Patient ID: [name], female   DOB: 1950-06-24, 70 y.o.   MRN: [id number] S: Patient feels well today with no complaints.  Seen in the dialysis unit before she starts dialysis   O:BP 119/82   Pulse (!) 47   Temp 98.1 F (36.7 C) (Oral)   Resp 12   Ht 5' 2.5" (1.588 m)   Wt 42.4 kg   LMP  (LMP Unknown)   SpO2 100%   BMI 16.82 kg/m   Intake/Output Summary (Last 24 hours) at 01/04/2021 0953 Last data filed at 01/04/2021 5465 Gross per 24 hour  Intake 540 ml  Output 0 ml  Net 540 ml   Intake/Output: I/O last 3 completed shifts: In: 540 [P.O.:540] Out: 0   Intake/Output this shift:  No intake/output data recorded. Weight change: -1.9 kg Gen:NAD, lying in bed CVS: Normal rate, no obvious rub Resp: Bilateral chest rise with no increased work of breathing Abd: +BS,s oft, NT/ND Ext: RUE with wound VAC in place  Recent Labs  Lab 12/30/20 0503 12/30/20 1313 12/31/20 0806 01/01/21 0059 01/02/21 0349 01/03/21 0503 01/04/21 0436  NA 136  --  133* 133* 139 133* 131*  K 3.4*  --  3.9 4.8 3.9 3.7 4.2  CL 102  --  98 98 105 99 97*  CO2 24  --  22 22 27 27 27   GLUCOSE 71  --  81 134* 83 79 99  BUN 33*  --  43* 51* 23 12 21   CREATININE 6.86*  --  8.25* 9.17* 5.26* 3.89* 5.57*  CALCIUM 8.2*  --  8.7* 8.5* 8.1* 8.8* 9.0  PHOS  --  5.0*  --   --   --   --   --    Liver Function Tests: No results for input(s): AST, ALT, ALKPHOS, BILITOT, PROT, ALBUMIN in the last 168 hours.  No results for input(s): LIPASE, AMYLASE in the last 168 hours. No results for input(s): AMMONIA in the last 168 hours. CBC: Recent Labs  Lab 12/31/20 0806 01/01/21 0059 01/02/21 0349 01/03/21 0503 01/04/21 0436  WBC 5.2 7.3 4.3 4.3 4.8  HGB 10.1* 10.2* 9.0* 9.2* 9.8*  HCT 32.4* 32.2* 29.5* 30.3* 32.1*  MCV 99.4 99.1 101.4* 100.7* 101.6*  PLT 151 179 142* 159 174   Cardiac Enzymes: No results for input(s): CKTOTAL, CKMB, CKMBINDEX, TROPONINI in the last 168 hours. CBG: Recent Labs  Lab  01/03/21 1154 01/03/21 1602 01/03/21 2050 01/03/21 2347 01/04/21 0343  GLUCAP 81 93 92 85 75    Iron Studies: No results for input(s): IRON, TIBC, TRANSFERRIN, FERRITIN in the last 72 hours. Studies/Results: No results found.  apixaban  2.5 mg Oral BID   calcitRIOL  1.25 mcg Oral Q T,Th,Sa-HD   Chlorhexidine Gluconate Cloth  6 each Topical Q0600   cinacalcet  60 mg Oral Q T,Th,Sa-HD   darbepoetin (ARANESP) injection - DIALYSIS  200 mcg Intravenous Q Thu-HD   dolutegravir  50 mg Oral q1600   fentaNYL  1 patch Transdermal Q72H   lamiVUDine  50 mg Oral Daily   methadone  2.5 mg Oral Q2000   metoprolol tartrate  50 mg Oral BID   mirtazapine  15 mg Oral QHS   multivitamin  1 tablet Oral QHS   pantoprazole  40 mg Oral QAC breakfast   vancomycin variable dose per unstable renal function (pharmacist dosing)   Does not apply See admin instructions   venlafaxine XR  150 mg Oral Q breakfast   zidovudine  300 mg Oral Daily    BMET    Component Value Date/Time   NA 131 (L) 01/04/2021 0436   K 4.2 01/04/2021 0436   CL 97 (L) 01/04/2021 0436   CO2 27 01/04/2021 0436   GLUCOSE 99 01/04/2021 0436   BUN 21 01/04/2021 0436   CREATININE 5.57 (H) 01/04/2021 0436   CREATININE 5.66 (H) 12/22/2019 1117   CALCIUM 9.0 01/04/2021 0436   CALCIUM 9.0 03/12/2011 0527   GFRNONAA 8 (L) 01/04/2021 0436   GFRNONAA 5 (L) 02/12/2015 1000   GFRAA 9 (L) 10/22/2019 0105   GFRAA 5 (L) 02/12/2015 1000   CBC    Component Value Date/Time   WBC 4.8 01/04/2021 0436   RBC 3.16 (L) 01/04/2021 0436   HGB 9.8 (L) 01/04/2021 0436   HCT 32.1 (L) 01/04/2021 0436   PLT 174 01/04/2021 0436   MCV 101.6 (H) 01/04/2021 0436   MCH 31.0 01/04/2021 0436   MCHC 30.5 01/04/2021 0436   RDW 18.3 (H) 01/04/2021 0436   LYMPHSABS 1.1 12/25/2020 0409   MONOABS 0.7 12/25/2020 0409   EOSABS 0.1 12/25/2020 0409   BASOSABS 0.0 12/25/2020 0409    Dialysis Orders: Center: [facility]  on TTS . EDW 41.5kg HD [place]  2K/2.5Ca  Time 3 hours Heparin 1000 units bolus then 500 units/hr. 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  appreciate vascular surgery evaluation-  s/p ligation and debridement 10/28.  Now with wound VAC in place.  On vanc until December 14. MRSA on culture.  We have communicated to her dialysis unit that she will need vancomycin until December 14  ESRD -continue dialysis.  Continue TTS schedule.  We will arrange outpatient antibiotics.  Stable for discharge from nephrology standpoint whenever she gets her placement set up.  Hypertension/volume  - stable- no BP meds-  Uf as able-  has prespecified max of 3.5 liters  Anemia  - Hb 9s. continue with ESA.    Metabolic bone disease -  continue with calcitriol and sensipar-phosphorus level 5  Nutrition - renal diet. AFib/FL:  improved with increased metoprolol dose HIV on meds   [name]

## 2021-01-04 NOTE — Plan of Care (Signed)
  Problem: Nutrition: Goal: Adequate nutrition will be maintained Outcome: Progressing   Problem: Elimination: Goal: Will not experience complications related to bowel motility Outcome: Progressing   

## 2021-01-04 NOTE — Progress Notes (Signed)
PROGRESS NOTE    [name]  GGY:[id number] DOB: Oct 21, 1950 DOA: 12/24/2020 PCP: Hilbert Corrigan, MD   Brief Narrative:  70 year old with history of ESRD on HD Tuesday Thursday Saturday, HIV, a flutter on Eliquis comes to the hospital with complaints of right upper extremity AV fistula infection.  She had an AV fistula occlusion about 4 weeks ago and underwent balloon angioplasty of the right subclavian and right innominate vein with conversion to right forearm fistula to right upper extremity fistula.  She initially went to [facility] where she was diagnosed with AV fistula infection and transferred to [facility].  Vascular, nephro, IR and infectious disease are consulted.  Patient underwent ligation of AV fistula and I&D on 12/27/2020.  I have placed a tunneled catheter 10/31.  Patient returned to the OR by vascular 11/1 for washout and wound VAC placement.  Wound VAC changed on 11/4, planning to involve plastic surgery if necessary.    Assessment & Plan:   Active Problems:   Human immunodeficiency virus (HIV) disease ([place])   Essential hypertension   ESRD on dialysis [facility])   Atrial flutter ([place])   Anemia of chronic disease   Infected fluid collection with fistula   Hypoglycemia   Right AV fistula infection now with purulent discharge - Vascular performed I&D/ligation of AV fistula on 12/19/2020.  Status post repeat or visit for washout and wound VAC placement on 12/31/2020.  Wound VAC/dressing changed by vascular on 11/4, planning again on Monday.  May require plastic surgery involvement? She really needs to improve her nutrition to help with the healing process -ID planning for Vanc with HD for total of 6 weeks.  Last day 02/12/2021 - Pain control, dry dressing.  ESRD on hemodialysis TTS -Temporary HD removed and tunneled cath placed on 10/31.  HD today  Hyperkalemia -Resolved.    Atrial flutter/fibrillation.  Rate better controlled -Continue  metoprolol to 50mg  BID.  Back on Eliquis  History of HIV - CD4 count 252, continue antiretrovirals  Anemia of chronic disease - Baseline hemoglobin 8.0.  Continue to monitor  History of chronic pain syndrome - On fentanyl patch and methadone  Severe protein calorie malnutrition -encourage oral intake. May end up needing adv feeding tube but would avoid it for now in the setting of active infection.   DVT prophylaxis: Eliquis Code Status: Full Family Communication:  None  Status is: Inpatient  Remains inpatient appropriate because: Maintain hospital stay until cleared by vascular service, thereafter she can go to SNF. Nutritional status  Nutrition Problem: Severe Malnutrition Etiology: chronic illness (ESRD on HD)  Signs/Symptoms: severe fat depletion, severe muscle depletion  Interventions: Snacks, MVI, Other (Comment) (double protein)  Body mass index is 16.82 kg/m.       Subjective: Seen and examined in hemodialysis, does not have any complaints.  Overall feels okay.  Tolerating HD okay  Review of systems: General: Denies fever, chills, night sweats or unintended weight loss. Resp: Denies cough, wheezing, shortness of breath. Cardiac: Denies chest pain, palpitations, orthopnea, paroxysmal nocturnal dyspnea. GI: Denies abdominal pain, nausea, vomiting, diarrhea or constipation GU: Denies dysuria, frequency, hesitancy or incontinence MS: Denies muscle aches, joint pain or swelling Neuro: Denies headache, neurologic deficits (focal weakness, numbness, tingling), abnormal gait Psych: Denies anxiety, depression, SI/HI/AVH Skin: Denies new rashes or lesions ID: Denies sick contacts, exotic exposures, travel Examination: Constitutional: Not in acute distress Respiratory: Clear to auscultation bilaterally Cardiovascular: Normal sinus rhythm, no rubs Abdomen: Nontender nondistended good bowel sounds  Musculoskeletal: No edema noted Skin: RUE wound vanc and dressing in  place.  Neurologic: CN 2-12 grossly intact.  And nonfocal Psychiatric: Normal judgment and insight. Alert and oriented x 3. Normal mood.     Wound vac in place RUE Objective: Vitals:   01/04/21 0344 01/04/21 0400 01/04/21 0723 01/04/21 0727  BP: (!) 152/99  (!) 191/97 (!) 180/97  Pulse: (!) 58   (!) 57  Resp: 17  17 12   Temp: 98 F (36.7 C)  98.1 F (36.7 C)   TempSrc: Oral  Oral   SpO2: 95%  100%   Weight:  43.3 kg 42.4 kg   Height:        Intake/Output Summary (Last 24 hours) at 01/04/2021 0813 Last data filed at 01/04/2021 0300 Gross per 24 hour  Intake 540 ml  Output 0 ml  Net 540 ml   Filed Weights   01/02/21 1251 01/04/21 0400 01/04/21 0723  Weight: 44.7 kg 43.3 kg 42.4 kg     Data Reviewed:   CBC: Recent Labs  Lab 12/31/20 0806 01/01/21 0059 01/02/21 0349 01/03/21 0503 01/04/21 0436  WBC 5.2 7.3 4.3 4.3 4.8  HGB 10.1* 10.2* 9.0* 9.2* 9.8*  HCT 32.4* 32.2* 29.5* 30.3* 32.1*  MCV 99.4 99.1 101.4* 100.7* 101.6*  PLT 151 179 142* 159 409   Basic Metabolic Panel: Recent Labs  Lab 12/30/20 1313 12/31/20 0806 01/01/21 0059 01/02/21 0349 01/03/21 0503 01/04/21 0436  NA  --  133* 133* 139 133* 131*  K  --  3.9 4.8 3.9 3.7 4.2  CL  --  98 98 105 99 97*  CO2  --  22 22 27 27 27   GLUCOSE  --  81 134* 83 79 99  BUN  --  43* 51* 23 12 21   CREATININE  --  8.25* 9.17* 5.26* 3.89* 5.57*  CALCIUM  --  8.7* 8.5* 8.1* 8.8* 9.0  MG  --  2.1 2.2 2.0 2.1 2.0  PHOS 5.0*  --   --   --   --   --    GFR: Estimated Creatinine Clearance: 6.6 mL/min (A) (by C-G formula based on SCr of 5.57 mg/dL (H)). Liver Function Tests: No results for input(s): AST, ALT, ALKPHOS, BILITOT, PROT, ALBUMIN in the last 168 hours.  No results for input(s): LIPASE, AMYLASE in the last 168 hours. No results for input(s): AMMONIA in the last 168 hours. Coagulation Profile: No results for input(s): INR, PROTIME in the last 168 hours. Cardiac Enzymes: No results for input(s): CKTOTAL,  CKMB, CKMBINDEX, TROPONINI in the last 168 hours. BNP (last 3 results) No results for input(s): PROBNP in the last 8760 hours. HbA1C: No results for input(s): HGBA1C in the last 72 hours. CBG: Recent Labs  Lab 01/03/21 1154 01/03/21 1602 01/03/21 2050 01/03/21 2347 01/04/21 0343  GLUCAP 81 93 92 85 75   Lipid Profile: No results for input(s): CHOL, HDL, LDLCALC, TRIG, CHOLHDL, LDLDIRECT in the last 72 hours. Thyroid Function Tests: No results for input(s): TSH, T4TOTAL, FREET4, T3FREE, THYROIDAB in the last 72 hours. Anemia Panel: No results for input(s): VITAMINB12, FOLATE, FERRITIN, TIBC, IRON, RETICCTPCT in the last 72 hours. Sepsis Labs: No results for input(s): PROCALCITON, LATICACIDVEN in the last 168 hours.   Recent Results (from the past 240 hour(s))  Surgical pcr screen     Status: None   Collection Time: 12/26/20 12:42 PM   Specimen: Nasal Mucosa; Nasal Swab  Result Value Ref Range Status   MRSA, PCR  NEGATIVE NEGATIVE Final   Staphylococcus aureus NEGATIVE NEGATIVE Final    Comment: (NOTE) The Xpert SA Assay (FDA approved for NASAL specimens in patients 63 years of age and older), is one component of a comprehensive surveillance program. It is not intended to diagnose infection nor to guide or monitor treatment. Performed at [facility], [place] [address] [place], [place] [address]   Aerobic Culture w Gram Stain (superficial specimen)     Status: None   Collection Time: 12/27/20 12:19 PM   Specimen: Abscess; Wound  Result Value Ref Range Status   Specimen Description ABSCESS  Final   Special Requests ARM RIGHT  Final   Gram Stain   Final    FEW WBC PRESENT,BOTH PMN AND MONONUCLEAR FEW GRAM POSITIVE COCCI IN PAIRS Performed at [facility], [address], [place], [place] [address]    Culture   Final    MODERATE METHICILLIN RESISTANT STAPHYLOCOCCUS AUREUS   Report Status 12/29/2020 FINAL  Final   Organism ID, Bacteria METHICILLIN RESISTANT  STAPHYLOCOCCUS AUREUS  Final      Susceptibility   Methicillin resistant staphylococcus aureus - MIC*    CIPROFLOXACIN >=8 RESISTANT Resistant     ERYTHROMYCIN >=8 RESISTANT Resistant     GENTAMICIN <=0.5 SENSITIVE Sensitive     OXACILLIN >=4 RESISTANT Resistant     TETRACYCLINE <=1 SENSITIVE Sensitive     VANCOMYCIN 1 SENSITIVE Sensitive     TRIMETH/SULFA <=10 SENSITIVE Sensitive     CLINDAMYCIN <=0.25 SENSITIVE Sensitive     RIFAMPIN <=0.5 SENSITIVE Sensitive     Inducible Clindamycin NEGATIVE Sensitive     * MODERATE METHICILLIN RESISTANT STAPHYLOCOCCUS AUREUS  Aerobic/Anaerobic Culture w Gram Stain (surgical/deep wound)     Status: None   Collection Time: 12/27/20  2:39 PM   Specimen: Wound; Tissue  Result Value Ref Range Status   Specimen Description TISSUE  Final   Special Requests RIGHT ARM WOUND FOR CULTURES SPEC A  Final   Gram Stain   Final    RARE WBC PRESENT, PREDOMINANTLY PMN RARE GRAM POSITIVE COCCI IN PAIRS    Culture   Final    RARE METHICILLIN RESISTANT STAPHYLOCOCCUS AUREUS NO ANAEROBES ISOLATED Performed at [facility], [place] [address], [place], [place] [address]    Report Status 01/01/2021 FINAL  Final   Organism ID, Bacteria METHICILLIN RESISTANT STAPHYLOCOCCUS AUREUS  Final      Susceptibility   Methicillin resistant staphylococcus aureus - MIC*    CIPROFLOXACIN >=8 RESISTANT Resistant     ERYTHROMYCIN >=8 RESISTANT Resistant     GENTAMICIN <=0.5 SENSITIVE Sensitive     OXACILLIN >=4 RESISTANT Resistant     TETRACYCLINE <=1 SENSITIVE Sensitive     VANCOMYCIN 1 SENSITIVE Sensitive     TRIMETH/SULFA <=10 SENSITIVE Sensitive     CLINDAMYCIN <=0.25 SENSITIVE Sensitive     RIFAMPIN <=0.5 SENSITIVE Sensitive     Inducible Clindamycin NEGATIVE Sensitive     * RARE METHICILLIN RESISTANT STAPHYLOCOCCUS AUREUS         Radiology Studies: No results found.      Scheduled Meds:  apixaban  2.5 mg Oral BID   calcitRIOL  1.25 mcg Oral Q  T,Th,Sa-HD   Chlorhexidine Gluconate Cloth  6 each Topical Q0600   cinacalcet  60 mg Oral Q T,Th,Sa-HD   darbepoetin (ARANESP) injection - DIALYSIS  200 mcg Intravenous Q Thu-HD   dolutegravir  50 mg Oral q1600   fentaNYL  1 patch Transdermal Q72H  lamiVUDine  50 mg Oral Daily   methadone  2.5 mg Oral Q2000   metoprolol tartrate  50 mg Oral BID   mirtazapine  15 mg Oral QHS   multivitamin  1 tablet Oral QHS   pantoprazole  40 mg Oral QAC breakfast   vancomycin variable dose per unstable renal function (pharmacist dosing)   Does not apply See admin instructions   venlafaxine XR  150 mg Oral Q breakfast   zidovudine  300 mg Oral Daily   Continuous Infusions:     LOS: 11 days   Time spent= 35 mins    Jonia Oakey Arsenio Loader, MD [facility]  If 7PM-7AM, please contact night-coverage  01/04/2021, 8:13 AM

## 2021-01-05 DIAGNOSIS — Z515 Encounter for palliative care: Secondary | ICD-10-CM | POA: Diagnosis not present

## 2021-01-05 DIAGNOSIS — T829XXA Unspecified complication of cardiac and vascular prosthetic device, implant and graft, initial encounter: Secondary | ICD-10-CM

## 2021-01-05 DIAGNOSIS — N186 End stage renal disease: Secondary | ICD-10-CM | POA: Diagnosis not present

## 2021-01-05 DIAGNOSIS — I4892 Unspecified atrial flutter: Secondary | ICD-10-CM | POA: Diagnosis not present

## 2021-01-05 DIAGNOSIS — R52 Pain, unspecified: Secondary | ICD-10-CM | POA: Diagnosis not present

## 2021-01-05 DIAGNOSIS — D638 Anemia in other chronic diseases classified elsewhere: Secondary | ICD-10-CM | POA: Diagnosis not present

## 2021-01-05 DIAGNOSIS — B20 Human immunodeficiency virus [HIV] disease: Secondary | ICD-10-CM | POA: Diagnosis not present

## 2021-01-05 DIAGNOSIS — Z7189 Other specified counseling: Secondary | ICD-10-CM | POA: Diagnosis not present

## 2021-01-05 LAB — BASIC METABOLIC PANEL
Anion gap: 9 (ref 5–15)
BUN: 11 mg/dL (ref 8–23)
CO2: 25 mmol/L (ref 22–32)
Calcium: 8.8 mg/dL — ABNORMAL LOW (ref 8.9–10.3)
Chloride: 99 mmol/L (ref 98–111)
Creatinine, Ser: 3.92 mg/dL — ABNORMAL HIGH (ref 0.44–1.00)
GFR, Estimated: 12 mL/min — ABNORMAL LOW (ref >60)
Glucose, Bld: 76 mg/dL (ref 70–99)
Potassium: 4.2 mmol/L (ref 3.5–5.1)
Sodium: 133 mmol/L — ABNORMAL LOW (ref 135–145)

## 2021-01-05 LAB — CBC
HCT: 32 % — ABNORMAL LOW (ref 36.0–46.0)
Hemoglobin: 9.7 g/dL — ABNORMAL LOW (ref 12.0–15.0)
MCH: 30.7 pg (ref 26.0–34.0)
MCHC: 30.3 g/dL (ref 30.0–36.0)
MCV: 101.3 fL — ABNORMAL HIGH (ref 80.0–100.0)
Platelets: 174 10*3/uL (ref 150–400)
RBC: 3.16 MIL/uL — ABNORMAL LOW (ref 3.87–5.11)
RDW: 18.7 % — ABNORMAL HIGH (ref 11.5–15.5)
WBC: 4.3 10*3/uL (ref 4.0–10.5)
nRBC: 0 % (ref 0.0–0.2)

## 2021-01-05 LAB — VANCOMYCIN, RANDOM: Vancomycin Rm: 34

## 2021-01-05 LAB — GLUCOSE, CAPILLARY
Glucose-Capillary: 143 mg/dL — ABNORMAL HIGH (ref 70–99)
Glucose-Capillary: 144 mg/dL — ABNORMAL HIGH (ref 70–99)
Glucose-Capillary: 73 mg/dL (ref 70–99)
Glucose-Capillary: 88 mg/dL (ref 70–99)
Glucose-Capillary: 97 mg/dL (ref 70–99)

## 2021-01-05 LAB — MAGNESIUM: Magnesium: 2.1 mg/dL (ref 1.7–2.4)

## 2021-01-05 MED ORDER — FOLIC ACID 1 MG PO TABS
1.0000 mg | ORAL_TABLET | Freq: Every day | ORAL | Status: DC
  Administered 2021-01-05 – 2021-01-09 (×4): 1 mg via ORAL
  Filled 2021-01-05 (×5): qty 1

## 2021-01-05 MED ORDER — LORATADINE 10 MG PO TABS: 10.0000 mg | ORAL_TABLET | Freq: Every day | ORAL | Status: DC | PRN

## 2021-01-05 NOTE — Consult Note (Signed)
Palliative Care Consult Note                                  Date: 01/05/2021   Patient Name: [name]  DOB: April 07, 1950  MRN: [id number]  Age / Sex: 70 y.o., female  PCP: Hilbert Corrigan, MD Referring Physician: Aline August, MD  Reason for Consultation: Establishing goals of care  HPI/Patient Profile: 70 y.o. female  with past medical history of ESRD on HD Tuesday Thursday Saturday, HIV, a flutter on Eliquis, AV fistula occlusion 4 weeks ago requiring balloon angioplasty and conversion of right forearm fistula to right upper extremity fistula presented with right upper extremity AV fistula infection and was transferred from [facility].  Vascular surgery, nephrology, IR and ID were consulted.  She underwent ligation of  AV fistula and I&D on 12/27/2020.  She had tunneled catheter placement on 12/30/2020.  She returned to the OR on 12/31/2020 by vascular surgery for washout and wound VAC placement.  Wound VAC changed on 01/03/2021. She was admitted on 12/24/2020 with right AV fistula infection, ESRD, A. fib/flutter, HIV, chronic pain, severe protein calorie malnutrition, and others.  Noted to have generalized deconditioning.   PMT was consulted for goals of care discussions.  Past Medical History:  Diagnosis Date   Anemia    Anxiety    Atrial flutter ([place]) 02/22/2015   C. difficile colitis 10/10/2011   Chronic diarrhea    Chronic pain    Depression    ESRD on hemodialysis [facility])    Dialysis [name]   GERD (gastroesophageal reflux disease)    HIV (human immunodeficiency virus infection) ([place])    Hypertension    Insomnia    Intracranial hemorrhage (HCC)    Muscle weakness (generalized)    Pulmonary HTN (HCC)    Tachycardia    TIA (transient ischemic attack)    [facility] do not have this dx   Traumatic hematoma of right upper arm 02/22/2015    Subjective:   This NP [name]  reviewed medical records, received report from team, assessed the patient and then meet at the patient's bedside to discuss diagnosis, prognosis, GOC, EOL wishes disposition and options.  I met with the patient at the bedside.  No family was present.   Concept of Palliative Care was introduced as specialized medical care for people and their families living with serious illness.  If focuses on providing relief from the symptoms and stress of a serious illness.  The goal is to improve quality of life for both the patient and the family. Values and goals of care important to patient and family were attempted to be elicited.  Created space and opportunity for patient  and family to explore thoughts and feelings regarding current medical situation   Natural trajectory and current clinical status were discussed. Questions and concerns addressed. Patient  encouraged to call with questions or concerns.    Patient/Family Understanding of Illness: She understands that she has a lot of health problems.  She asks why she has all these health problems.  We had a general discussion on theories behind aging and chronic symptom burden accumulation over the course of her lifetime.  She notes that at [facility], where she lives, she does not like the food which is why she does not eat much and why she is losing weight.  She does admit chronic nausea and  vomiting as well.  She states she has had an infection in her fistula at least a couple times.  Life Review: Patient has a daughter, sister, and [place].  She finished high school and worked in [employer], Materials engineer, and a nursing home.  She likes to draw pictures and says this was "God's gift to me".  She also likes to play games such as Rubik's cubes, can be crushed, other tablet/phone games.  She enjoys Pharmacist, hospital music.  In her free time she also likes to crochet, neck, gray hair.  She is hopeful for improvement in her hand functions that she can continue doing  these.  Patient Values: Faith, family  Today's Discussion: We discussed her multiple chronic conditions, the additive effect of these, and the fact that it gets harder to bounce back with each subsequent illness.  We discussed that she can see this as it gets harder and takes longer to recover each time she was admitted.  We discussed that our bodies are organic and do breakdown over time.  It is inevitable that each of [place] must pass away eventually.  We discussed areas related to quality of life including function, nutrition.  She states that she does not like the food at her nursing home which is why she does not eat.  She does not like supplements such as Ensure and boost.  She has not tried fruit flavored options such as boost breeze and I encouraged her to try these.  With goals of care discussion and discussion on DNR, specifically, she became emotional and somewhat tearful.  She states somebody else's talk to her about this but they were harsh and the way they presented it which made her upset and made her cry.  She states "I am a very tender hearted person."  We discussed that these are very difficult discussions to have, but they are important to have.  She did agree with this.  She states that if she gets sick she does not want to come back to the hospital and she is okay with IV fluids and antibiotics.  She would like time to think about CODE STATUS.  She was quite adamant that she does not want a feeding tube.  I discussed the need for her to try to have adequate nutrition in order to support her bodily functions given that she will not allow a feeding tube.  She shared with me that in the past year she has lost her brother, her stepson, and her foster mom.  She also previously lost her foster father.  She expresses that this has been a sad time for her.  She does find solace in faith.  I offered, and she accepted, consult for spiritual services with chaplain program.  She expressed that if  she was unable to make her decisions for herself she would want her daughter to make her decisions.  I provided emotional and general support through therapeutic listening, reminiscing, laughter, and other techniques.  I answered all of her questions and addressed any concerns.  Review of Systems  Constitutional:  Positive for activity change, appetite change and fatigue.  Respiratory:  Negative for shortness of breath.   Cardiovascular:  Negative for chest pain.  Gastrointestinal:  Negative for nausea and vomiting.  Musculoskeletal:        Arm pain "whole right arm"   Objective:   Primary Diagnoses: Present on Admission:  Infected fluid collection with fistula  Human immunodeficiency virus (HIV) disease ([place])  Essential hypertension  Anemia of chronic disease  Atrial flutter ([place])  Hypoglycemia   Physical Exam Vitals and nursing note reviewed.  Constitutional:      General: She is not in acute distress.    Appearance: She is obese. She is ill-appearing. She is not toxic-appearing.  HENT:     Head: Normocephalic and atraumatic.  Cardiovascular:     Rate and Rhythm: Normal rate.     Heart sounds: No murmur heard. Pulmonary:     Effort: Pulmonary effort is normal. No respiratory distress.     Breath sounds: Normal breath sounds. No wheezing or rhonchi.  Abdominal:     General: Abdomen is flat.     Palpations: Abdomen is soft.  Skin:    General: Skin is warm and dry.     Comments: Nored wound vac in place right upper arm  Neurological:     General: No focal deficit present.     Mental Status: She is alert.  Psychiatric:        Mood and Affect: Mood normal.        Behavior: Behavior normal.     Comments: Intermittently tearful when discussing code status    Vital Signs:  BP (!) 158/90 (BP Location: Left Leg)   Pulse 60   Temp 98.8 F (37.1 C) (Oral)   Resp 18   Ht 5' 2.5" (1.588 m)   Wt 41.9 kg   LMP  (LMP Unknown)   SpO2 96%   BMI 16.63 kg/m   Palliative  Assessment/Data: 40%    Advanced Care Planning:   Primary Decision Maker: PATIENT  Code Status/Advance Care Planning: Full code  A discussion was had today regarding advanced directives. Concepts specific to code status, artifical feeding and hydration, continued IV antibiotics and rehospitalization was had.  The difference between a aggressive medical intervention path and a palliative comfort care path for this patient at this time was had. The MOST form was introduced and discussed, but not completed while allowing time to think on code status  Decisions/Changes to ACP: Remain full code for now NO PEG TUBE  Assessment & Plan:   Impression: 70 year old female with multiple chronic comorbidities as described above.  She was admitted for yet another fistula infection requiring surgical intervention and now with a wound VAC.  She is deconditioned, weak, malnourished.  She does not want a PEG tube.  We discussed the need for adequate nutrition, although she does not like the food where she lives.  Overall poor long-term prognosis.  I have begun discussions on CODE STATUS and goals.  She is clear on her goals, other than her CODE STATUS.  Recommend completing MOST form when she has had time to think and decide on her CODE STATUS.  SUMMARY OF RECOMMENDATIONS   Continue discussions on code status - gently Chaplain consult Full scope of care for now PMT will continue to follow for ongoing [place] discussions  Symptom Management:  Per primary team PMT is available to assist as needed  Prognosis:  Unable to determine  Discharge Planning:  To Be Determined   Discussed with: Patient, medical team, nursing team    Thank you for allowing [place] to participate in the care of SHAYNAH HUND PMT will continue to support holistically.  Time Total: 70 min  Greater than 50%  of this time was spent counseling and coordinating care related to the above assessment and plan.  Signed by: [name], NP Palliative Medicine Team  Team Phone # [phone] (  Nights/Weekends)  01/05/2021, 12:45 PM

## 2021-01-05 NOTE — Progress Notes (Signed)
Patient ID: [name], female   DOB: 1951-02-19, 70 y.o.   MRN: [id number] S: Patient resting today with no complaints.  Tolerated dialysis   O:BP (!) 168/85 (BP Location: Left Leg)   Pulse (!) 57   Temp 100.2 F (37.9 C) (Oral)   Resp 18   Ht 5' 2.5" (1.588 m)   Wt 41.9 kg   LMP  (LMP Unknown)   SpO2 96%   BMI 16.63 kg/m   Intake/Output Summary (Last 24 hours) at 01/05/2021 0838 Last data filed at 01/05/2021 0600 Gross per 24 hour  Intake 480 ml  Output 1050 ml  Net -570 ml   Intake/Output: I/O last 3 completed shifts: In: 600 [P.O.:600] Out: 1050 [Drains:50; Other:1000]  Intake/Output this shift:  No intake/output data recorded. Weight change: -0.9 kg Gen:NAD, lying in bed CVS: Normal rate, no obvious rub Resp: Bilateral chest rise with no increased work of breathing Abd: +BS,s oft, NT/ND Ext: RUE with wound VAC in place  Recent Labs  Lab 12/30/20 0503 12/30/20 1313 12/31/20 0806 01/01/21 0059 01/02/21 0349 01/03/21 0503 01/04/21 0436 01/05/21 0635  NA 136  --  133* 133* 139 133* 131* 133*  K 3.4*  --  3.9 4.8 3.9 3.7 4.2 4.2  CL 102  --  98 98 105 99 97* 99  CO2 24  --  22 22 27 27 27 25   GLUCOSE 71  --  81 134* 83 79 99 76  BUN 33*  --  43* 51* 23 12 21 11   CREATININE 6.86*  --  8.25* 9.17* 5.26* 3.89* 5.57* 3.92*  CALCIUM 8.2*  --  8.7* 8.5* 8.1* 8.8* 9.0 8.8*  PHOS  --  5.0*  --   --   --   --   --   --    Liver Function Tests: No results for input(s): AST, ALT, ALKPHOS, BILITOT, PROT, ALBUMIN in the last 168 hours.  No results for input(s): LIPASE, AMYLASE in the last 168 hours. No results for input(s): AMMONIA in the last 168 hours. CBC: Recent Labs  Lab 01/01/21 0059 01/02/21 0349 01/03/21 0503 01/04/21 0436 01/05/21 0635  WBC 7.3 4.3 4.3 4.8 4.3  HGB 10.2* 9.0* 9.2* 9.8* 9.7*  HCT 32.2* 29.5* 30.3* 32.1* 32.0*  MCV 99.1 101.4* 100.7* 101.6* 101.3*  PLT 179 142* 159 174 174   Cardiac Enzymes: No results for input(s): CKTOTAL,  CKMB, CKMBINDEX, TROPONINI in the last 168 hours. CBG: Recent Labs  Lab 01/04/21 1253 01/04/21 1632 01/04/21 2017 01/05/21 0007 01/05/21 0405  GLUCAP 134* 85 116* 73 143*    Iron Studies: No results for input(s): IRON, TIBC, TRANSFERRIN, FERRITIN in the last 72 hours. Studies/Results: No results found.  apixaban  2.5 mg Oral BID   calcitRIOL  1.25 mcg Oral Q T,Th,Sa-HD   Chlorhexidine Gluconate Cloth  6 each Topical Q0600   cinacalcet  60 mg Oral Q T,Th,Sa-HD   darbepoetin (ARANESP) injection - DIALYSIS  200 mcg Intravenous Q Thu-HD   dolutegravir  50 mg Oral q1600   fentaNYL  1 patch Transdermal Q72H   lamiVUDine  50 mg Oral Daily   methadone  2.5 mg Oral Q2000   metoprolol tartrate  50 mg Oral BID   mirtazapine  15 mg Oral QHS   multivitamin  1 tablet Oral QHS   pantoprazole  40 mg Oral QAC breakfast   vancomycin variable dose per unstable renal function (pharmacist dosing)   Does not apply See admin instructions  venlafaxine XR  150 mg Oral Q breakfast   zidovudine  300 mg Oral Daily    BMET    Component Value Date/Time   NA 133 (L) 01/05/2021 0635   K 4.2 01/05/2021 0635   CL 99 01/05/2021 0635   CO2 25 01/05/2021 0635   GLUCOSE 76 01/05/2021 0635   BUN 11 01/05/2021 0635   CREATININE 3.92 (H) 01/05/2021 0635   CREATININE 5.66 (H) 12/22/2019 1117   CALCIUM 8.8 (L) 01/05/2021 0635   CALCIUM 9.0 03/12/2011 0527   GFRNONAA 12 (L) 01/05/2021 0635   GFRNONAA 5 (L) 02/12/2015 1000   GFRAA 9 (L) 10/22/2019 0105   GFRAA 5 (L) 02/12/2015 1000   CBC    Component Value Date/Time   WBC 4.3 01/05/2021 0635   RBC 3.16 (L) 01/05/2021 0635   HGB 9.7 (L) 01/05/2021 0635   HCT 32.0 (L) 01/05/2021 0635   PLT 174 01/05/2021 0635   MCV 101.3 (H) 01/05/2021 0635   MCH 30.7 01/05/2021 0635   MCHC 30.3 01/05/2021 0635   RDW 18.7 (H) 01/05/2021 0635   LYMPHSABS 1.1 12/25/2020 0409   MONOABS 0.7 12/25/2020 0409   EOSABS 0.1 12/25/2020 0409   BASOSABS 0.0 12/25/2020 0409     Dialysis Orders: Center: [facility]  on TTS . EDW 41.5kg HD Bath 2K/2.5Ca  Time 3 hours Heparin 1000 units bolus then 500 units/hr. 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  appreciate vascular surgery evaluation-  s/p ligation and debridement 10/28.  Now with wound VAC in place.  On vanc until December 14. MRSA on culture.  We have communicated to her dialysis unit that she will need vancomycin until December 14  ESRD -continue dialysis.  Continue TTS schedule.  We have contacted her outpatient dialysis unit regarding antibiotics.  Stable for discharge from nephrology standpoint whenever she gets her placement set up.  Hypertension/volume  - stable- no BP meds-  Uf as able-  has prespecified max of 3.5 liters  Anemia  - Hb 9s. continue with ESA.    Metabolic bone disease -  continue with calcitriol and sensipar-phosphorus level 5  Nutrition - renal diet. AFib/FL:  improved with increased metoprolol dose HIV on meds   [name]

## 2021-01-05 NOTE — Progress Notes (Addendum)
ANTIBIOTIC CONSULT NOTE  Pharmacy Consult for vanc Indication: cath infection  Allergies  Allergen Reactions   Lactose Intolerance (Gi) Other (See Comments)    On MAR   Latex Rash and Itching   Penicillins Rash and Swelling    Has patient had a PCN reaction causing immediate rash, facial/tongue/throat swelling, SOB or lightheadedness with hypotension: No Has patient had a PCN reaction causing severe rash involving mucus membranes or skin necrosis: No Has patient had a PCN reaction that required hospitalization No Has patient had a PCN reaction occurring within the last 10 years: No If all of the above answers are "NO", then may proceed with Cephalosporin use.      Patient Measurements: Height: 5' 2.5" (158.8 cm) Weight: 41.9 kg (92 lb 6 oz) IBW/kg (Calculated) : 51.25 Heparin Dosing Weight: TBW  Vital Signs: Temp: 98.8 F (37.1 C) (11/06 0921) Temp Source: Oral (11/06 0921) BP: 158/90 (11/06 0921) Pulse Rate: 60 (11/06 0921)  Labs: Recent Labs    01/03/21 0503 01/04/21 0436 01/05/21 0635  HGB 9.2* 9.8* 9.7*  HCT 30.3* 32.1* 32.0*  PLT 159 174 174  CREATININE 3.89* 5.57* 3.92*     Estimated Creatinine Clearance: 9.2 mL/min (A) (by C-G formula based on SCr of 3.92 mg/dL (H)).   Assessment: 61 yof admitted with an infected RUE fistula. Apixaban from PTA for Aflutter held on admit, last dose 10/24. She is s/p ligation right arm AV fistula and I&D right arm wound on 10/28. Per ID, patient will receive vancomycin for 6 weeks (through 12/14) followed by suppressive therapy.   Pt is s/p TDC>>Palindrome conversion. After getting 2 additional dose of vanc while on schedule vanc, random vanc level came back at 82 on 11/1. Patient received 3 hours of HD on 11/2 and 2.5h hours 11/3. Pre-HD level 11/3 was 44 mcg/ml.    Calculated Vancomycin concentration prior to HD 11/5 was 17- received ~3 hours HD 11/5 with blood flow rate of 400 should have put patient in therapeutic range  with 500 mg dose. VR 11/6 supratherapeutic at 34 (goal 15-25) following 500 mg dose of Vancomycin with last HD session on 11/5. Dose was documented as "not given" on the [facility], but per the comments, HD nurse told the floor nurse the appropriate vancomycin dose was given. Plan to hold, and recheck VR following HD 11/8.  Goal of Therapy:  PreHD vanc: 15-25   Plan:   - Check VR 11/8 following HD   Adria Dill, PharmD PGY-1 Acute Care Resident  01/05/2021 2:55 PM

## 2021-01-05 NOTE — Progress Notes (Signed)
Patient ID: [name], female   DOB: 07-Sep-1950, 70 y.o.   MRN: [id number]  PROGRESS NOTE    [name]  GBT:[id number] DOB: [birth year] DOA: 12/24/2020 PCP: Hilbert Corrigan, MD   Brief Narrative:  70 year old with history of ESRD on HD Tuesday Thursday Saturday, HIV, a flutter on Eliquis, AV fistula occlusion 4 weeks ago requiring balloon angioplasty and conversion of right forearm fistula to right upper extremity fistula presented with right upper extremity AV fistula infection and was transferred from [facility]  Vascular surgery, nephrology, IR and ID were consulted.  She underwent ligation of  AV fistula and I&D on 12/27/2020.  She had tunneled catheter placement on 12/30/2020.  She returned to the OR on 12/31/2020 by vascular surgery for washout and wound VAC placement.  Wound VAC changed on 01/03/2021.  Assessment & Plan:   Right AV fistula infection -Status post I&D/ligation of AV fistula on 12/27/2020.  Status post OR on 12/31/2020 for washout and wound VAC placement -Wound VAC changed on 01/03/2021.  Vascular surgery is planning for wound VAC change on 01/03/2021: If no improvement, might need plastic surgery improvement. -ID recommended IV vancomycin with hemodialysis for total of 6 weeks; last day would be 02/12/2021 [facility] care as per vascular surgery -Continue pain control  End-stage renal disease on hemodialysis -Nephrology following.  Dialysis as per nephrology schedule  Atrial fibrillation/flutter -Currently intermittently bradycardic.  Continue metoprolol and apixaban  HIV -CD4 252.  Continue antiretrovirals.  Outpatient follow-up with ID  Anemia of chronic disease -Hemoglobin stable.  Monitor intermittently  History of chronic pain syndrome -On fentanyl patch and methadone.  Outpatient follow-up with pain management  Severe protein calorie malnutrition -Encourage oral intake.  May end up needing feeding tube if oral intake does not  improve.  Would avoid in the setting of active infection -Palliative care consultation for goals of care discussion  Hyponatremia -Being managed with dialysis by nephrology  Macrocytosis -Check vitamin B12 level in AM.  Start folic acid supplementation empirically.  Generalized deconditioning - will need SNF placement eventually.   DVT prophylaxis: Eliquis Code Status: Full Family Communication: None at bedside Disposition Plan: Status is: Inpatient  Remains inpatient appropriate because: Need for further vascular surgery intervention  Consultants: Vascular surgery/nephrology/ID/IR  Procedures: As above  Antimicrobials:  Anti-infectives (From admission, onward)    Start     Dose/Rate Route Frequency Ordered Stop   01/04/21 1200  vancomycin (VANCOREADY) IVPB 500 mg/100 mL  Status:  Discontinued        500 mg 100 mL/hr over 60 Minutes Intravenous Once in dialysis 01/04/21 0915 01/05/21 0759   12/31/20 1200  vancomycin (VANCOCIN) IVPB 500 mg/100 ml premix  Status:  Discontinued        500 mg 100 mL/hr over 60 Minutes Intravenous Every T-Th-Sa (Hemodialysis) 12/28/20 0827 12/30/20 1234   12/31/20 0955  vancomycin variable dose per unstable renal function (pharmacist dosing)         Does not apply See admin instructions 12/31/20 0955     12/30/20 1128  vancomycin (VANCOCIN) IVPB 1000 mg/200 mL premix        over 60 Minutes Intravenous Continuous PRN 12/30/20 1148 12/30/20 1128   12/30/20 1028  vancomycin (VANCOCIN) 1-5 GM/200ML-% IVPB       Note to Pharmacy: Lytle Butte   : cabinet override      12/30/20 1028 12/30/20 2244   12/30/20 0000  vancomycin (VANCOCIN) IVPB 1000 mg/200 mL premix  1,000 mg 200 mL/hr over 60 Minutes Intravenous To Radiology 12/29/20 1348 12/30/20 0118   12/27/20 1347  vancomycin (VANCOCIN) 1-5 GM/200ML-% IVPB       Note to Pharmacy: Brandt Loosen, GRETA   : cabinet override      12/27/20 1347 12/27/20 1420   12/27/20 1345  vancomycin (VANCOCIN)  IVPB 1000 mg/200 mL premix        1,000 mg 200 mL/hr over 60 Minutes Intravenous To Surgery 12/27/20 1335 12/27/20 1436   12/27/20 0600  ceFAZolin (ANCEF) IVPB 2g/100 mL premix  Status:  Discontinued        2 g 200 mL/hr over 30 Minutes Intravenous To Radiology 12/26/20 1016 12/26/20 1055   12/26/20 1630  vancomycin (VANCOCIN) IVPB 500 mg/100 ml premix        500 mg 100 mL/hr over 60 Minutes Intravenous  Once 12/26/20 1455 12/26/20 1839   12/26/20 1345  lamiVUDine (EPIVIR) 10 MG/ML solution 50 mg        50 mg Oral Daily 12/26/20 1254     12/26/20 1115  zidovudine (RETROVIR) capsule 300 mg        300 mg Oral Daily 12/26/20 1023     12/25/20 2000  ceFEPIme (MAXIPIME) 1 g in sodium chloride 0.9 % 100 mL IVPB  Status:  Discontinued        1 g 200 mL/hr over 30 Minutes Intravenous Every 24 hours 12/25/20 1142 12/29/20 1002   12/25/20 1600  dolutegravir (TIVICAY) tablet 50 mg        50 mg Oral Daily-1600 12/25/20 0918     12/25/20 1318  vancomycin variable dose per unstable renal function (pharmacist dosing)  Status:  Discontinued         Does not apply See admin instructions 12/25/20 1319 12/28/20 0827   12/25/20 0845  vancomycin (VANCOCIN) IVPB 1000 mg/200 mL premix  Status:  Discontinued        1,000 mg 200 mL/hr over 60 Minutes Intravenous  Once 12/25/20 0833 12/25/20 0842   12/25/20 0845  ceFEPIme (MAXIPIME) 2 g in sodium chloride 0.9 % 100 mL IVPB  Status:  Discontinued        2 g 200 mL/hr over 30 Minutes Intravenous  Once 12/25/20 0833 12/25/20 0843   12/24/20 1945  zidovudine (RETROVIR) capsule 100 mg  Status:  Discontinued       Note to Pharmacy: (0900, 1300, & 2100)     100 mg Oral 3 times daily 12/24/20 1827 12/26/20 1023   12/24/20 1830  vancomycin (VANCOCIN) IVPB 1000 mg/200 mL premix        1,000 mg 200 mL/hr over 60 Minutes Intravenous  Once 12/24/20 1719 12/24/20 1905   12/24/20 1830  lamivudine (EPIVIR) tablet 50 mg  Status:  Discontinued       Note to Pharmacy: (0800)      50 mg Oral Daily 12/24/20 1827 12/26/20 1254   12/24/20 1800  ceFEPIme (MAXIPIME) 2 g in sodium chloride 0.9 % 100 mL IVPB        2 g 200 mL/hr over 30 Minutes Intravenous  Once 12/24/20 1718 12/24/20 1800        Subjective: Patient seen and examined at bedside.  Poor historian.  Oral intake is still poor.  No overnight fever, vomiting, seizures reported.  Objective: Vitals:   01/04/21 1633 01/04/21 2014 01/05/21 0407 01/05/21 0921  BP: (!) 141/102 (!) 154/89 (!) 168/85 (!) 158/90  Pulse: (!) 58 66 (!) 57 60  Resp: 18  18 18 18   Temp: 98.4 F (36.9 C) 99.3 F (37.4 C) 100.2 F (37.9 C) 98.8 F (37.1 C)  TempSrc: Oral Oral Oral Oral  SpO2: 100% 98% 96% 96%  Weight:   41.9 kg   Height:        Intake/Output Summary (Last 24 hours) at 01/05/2021 1115 Last data filed at 01/05/2021 0900 Gross per 24 hour  Intake 480 ml  Output 50 ml  Net 430 ml   Filed Weights   01/04/21 0723 01/04/21 1055 01/05/21 0407  Weight: 42.4 kg 41.4 kg 41.9 kg    Examination:  General exam: Appears calm and comfortable.  Looks chronically ill and deconditioned.  Currently on room air.  Extremely thinly built.  Extremely poor historian. Respiratory system: Bilateral decreased breath sounds at bases with scattered crackles Cardiovascular system: S1 & S2 heard, intermittently bradycardic Gastrointestinal system: Abdomen is nondistended, soft and nontender. Normal bowel sounds heard. Extremities: No cyanosis, clubbing; trace lower extremity edema present Central nervous system: Awake; extremely slow to respond.  No focal neurological deficits. Moving extremities Skin: Right upper extremity wound VAC with dressing present Psychiatry: Affect is mostly flat.  Does not participate in communication with.   Data Reviewed: I have personally reviewed following labs and imaging studies  CBC: Recent Labs  Lab 01/01/21 0059 01/02/21 0349 01/03/21 0503 01/04/21 0436 01/05/21 0635  WBC 7.3 4.3 4.3 4.8  4.3  HGB 10.2* 9.0* 9.2* 9.8* 9.7*  HCT 32.2* 29.5* 30.3* 32.1* 32.0*  MCV 99.1 101.4* 100.7* 101.6* 101.3*  PLT 179 142* 159 174 119   Basic Metabolic Panel: Recent Labs  Lab 12/30/20 1313 12/31/20 0806 01/01/21 0059 01/02/21 0349 01/03/21 0503 01/04/21 0436 01/05/21 0635  NA  --    < > 133* 139 133* 131* 133*  K  --    < > 4.8 3.9 3.7 4.2 4.2  CL  --    < > 98 105 99 97* 99  CO2  --    < > 22 27 27 27 25   GLUCOSE  --    < > 134* 83 79 99 76  BUN  --    < > 51* 23 12 21 11   CREATININE  --    < > 9.17* 5.26* 3.89* 5.57* 3.92*  CALCIUM  --    < > 8.5* 8.1* 8.8* 9.0 8.8*  MG  --    < > 2.2 2.0 2.1 2.0 2.1  PHOS 5.0*  --   --   --   --   --   --    < > = values in this interval not displayed.   GFR: Estimated Creatinine Clearance: 9.2 mL/min (A) (by C-G formula based on SCr of 3.92 mg/dL (H)). Liver Function Tests: No results for input(s): AST, ALT, ALKPHOS, BILITOT, PROT, ALBUMIN in the last 168 hours. No results for input(s): LIPASE, AMYLASE in the last 168 hours. No results for input(s): AMMONIA in the last 168 hours. Coagulation Profile: No results for input(s): INR, PROTIME in the last 168 hours. Cardiac Enzymes: No results for input(s): CKTOTAL, CKMB, CKMBINDEX, TROPONINI in the last 168 hours. BNP (last 3 results) No results for input(s): PROBNP in the last 8760 hours. HbA1C: No results for input(s): HGBA1C in the last 72 hours. CBG: Recent Labs  Lab 01/04/21 1253 01/04/21 1632 01/04/21 2017 01/05/21 0007 01/05/21 0405  GLUCAP 134* 85 116* 73 143*   Lipid Profile: No results for input(s): CHOL, HDL, LDLCALC, TRIG, CHOLHDL, LDLDIRECT in the  last 72 hours. Thyroid Function Tests: No results for input(s): TSH, T4TOTAL, FREET4, T3FREE, THYROIDAB in the last 72 hours. Anemia Panel: No results for input(s): VITAMINB12, FOLATE, FERRITIN, TIBC, IRON, RETICCTPCT in the last 72 hours. Sepsis Labs: No results for input(s): PROCALCITON, LATICACIDVEN in the last 168  hours.  Recent Results (from the past 240 hour(s))  Surgical pcr screen     Status: None   Collection Time: 12/26/20 12:42 PM   Specimen: Nasal Mucosa; Nasal Swab  Result Value Ref Range Status   MRSA, PCR NEGATIVE NEGATIVE Final   Staphylococcus aureus NEGATIVE NEGATIVE Final    Comment: (NOTE) The Xpert SA Assay (FDA approved for NASAL specimens in patients 17 years of age and older), is one component of a comprehensive surveillance program. It is not intended to diagnose infection nor to guide or monitor treatment. Performed at [facility], [place] [address], [place], [place] [address]   Aerobic Culture w Gram Stain (superficial specimen)     Status: None   Collection Time: 12/27/20 12:19 PM   Specimen: Abscess; Wound  Result Value Ref Range Status   Specimen Description ABSCESS  Final   Special Requests ARM RIGHT  Final   Gram Stain   Final    FEW WBC PRESENT,BOTH PMN AND MONONUCLEAR FEW GRAM POSITIVE COCCI IN PAIRS Performed at [facility], [address], [place], [place] [address]    Culture   Final    MODERATE METHICILLIN RESISTANT STAPHYLOCOCCUS AUREUS   Report Status 12/29/2020 FINAL  Final   Organism ID, Bacteria METHICILLIN RESISTANT STAPHYLOCOCCUS AUREUS  Final      Susceptibility   Methicillin resistant staphylococcus aureus - MIC*    CIPROFLOXACIN >=8 RESISTANT Resistant     ERYTHROMYCIN >=8 RESISTANT Resistant     GENTAMICIN <=0.5 SENSITIVE Sensitive     OXACILLIN >=4 RESISTANT Resistant     TETRACYCLINE <=1 SENSITIVE Sensitive     VANCOMYCIN 1 SENSITIVE Sensitive     TRIMETH/SULFA <=10 SENSITIVE Sensitive     CLINDAMYCIN <=0.25 SENSITIVE Sensitive     RIFAMPIN <=0.5 SENSITIVE Sensitive     Inducible Clindamycin NEGATIVE Sensitive     * MODERATE METHICILLIN RESISTANT STAPHYLOCOCCUS AUREUS  Aerobic/Anaerobic Culture w Gram Stain (surgical/deep wound)     Status: None   Collection Time: 12/27/20  2:39 PM   Specimen: Wound; Tissue  Result Value  Ref Range Status   Specimen Description TISSUE  Final   Special Requests RIGHT ARM WOUND FOR CULTURES SPEC A  Final   Gram Stain   Final    RARE WBC PRESENT, PREDOMINANTLY PMN RARE GRAM POSITIVE COCCI IN PAIRS    Culture   Final    RARE METHICILLIN RESISTANT STAPHYLOCOCCUS AUREUS NO ANAEROBES ISOLATED Performed at [facility], [place] [address] [place], [place] [address]    Report Status 01/01/2021 FINAL  Final   Organism ID, Bacteria METHICILLIN RESISTANT STAPHYLOCOCCUS AUREUS  Final      Susceptibility   Methicillin resistant staphylococcus aureus - MIC*    CIPROFLOXACIN >=8 RESISTANT Resistant     ERYTHROMYCIN >=8 RESISTANT Resistant     GENTAMICIN <=0.5 SENSITIVE Sensitive     OXACILLIN >=4 RESISTANT Resistant     TETRACYCLINE <=1 SENSITIVE Sensitive     VANCOMYCIN 1 SENSITIVE Sensitive     TRIMETH/SULFA <=10 SENSITIVE Sensitive     CLINDAMYCIN <=0.25 SENSITIVE Sensitive     RIFAMPIN <=0.5 SENSITIVE Sensitive     Inducible Clindamycin NEGATIVE Sensitive     *  RARE METHICILLIN RESISTANT STAPHYLOCOCCUS AUREUS         Radiology Studies: No results found.      Scheduled Meds:  apixaban  2.5 mg Oral BID   calcitRIOL  1.25 mcg Oral Q T,Th,Sa-HD   Chlorhexidine Gluconate Cloth  6 each Topical Q0600   cinacalcet  60 mg Oral Q T,Th,Sa-HD   darbepoetin (ARANESP) injection - DIALYSIS  200 mcg Intravenous Q Thu-HD   dolutegravir  50 mg Oral q1600   fentaNYL  1 patch Transdermal Q72H   lamiVUDine  50 mg Oral Daily   methadone  2.5 mg Oral Q2000   metoprolol tartrate  50 mg Oral BID   mirtazapine  15 mg Oral QHS   multivitamin  1 tablet Oral QHS   pantoprazole  40 mg Oral QAC breakfast   vancomycin variable dose per unstable renal function (pharmacist dosing)   Does not apply See admin instructions   venlafaxine XR  150 mg Oral Q breakfast   zidovudine  300 mg Oral Daily   Continuous Infusions:        Aline August, MD [facility] 01/05/2021, 11:15  AM

## 2021-01-05 NOTE — Progress Notes (Signed)
Occupational Therapy Treatment Patient Details Name: KAYANA THOEN MRN: [id number] DOB: 02-Nov-1950 Today's Date: 01/05/2021   History of present illness 70 y.o. female presents to [facility] hospital on 12/24/2020 with RUE fistula infection. Pt underwent temporary HDU catheter placement on 12/26/2020. S/p ligation and debridement 12/27/2020. PMH includes ESRD on HDU TTS, HIV, a flutter.   OT comments  Pt pleasant and agreeable to session this date, focus of session on varying levels of AROM/SROM and PROM with gentle but prolonged stretch to facilitation flexion/extension with goal of improving overall function of R UE. Pt with fair recall of established HEP mentioned in previous session; benefited review. Pt demo's ability to reposition self at Sup level for sustained pressure relief to sacral region that she states is 'sore'. Nursing made aware. Pt requiring set up for meals with difficulty with opening/closing and cutting of food d/t deficits in FM control/strength and coordination of R UE. Overall pt with excellent participation, and would benefit from continued skilled OT services acutely with recommendations listed below.    Recommendations for follow up therapy are one component of a multi-disciplinary discharge planning process, led by the attending physician.  Recommendations may be updated based on patient status, additional functional criteria and insurance authorization.    Follow Up Recommendations  Long-term institutional care without follow-up therapy    Assistance Recommended at Discharge Frequent or constant Supervision/Assistance  Equipment Recommendations  None recommended by OT    Recommendations for Other Services      Precautions / Restrictions Precautions Precautions: Fall Precaution Comments: R arm wound vac Restrictions Weight Bearing Restrictions: No       Mobility Bed Mobility Overal bed mobility: Needs Assistance Bed Mobility: Rolling Rolling: Supervision          General bed mobility comments: pt able to utilize LE's and bed rail for rolling B directions for repositinoing and pillow placement    Transfers                         Balance                                           ADL either performed or assessed with clinical judgement   ADL Overall ADL's : Needs assistance/impaired Eating/Feeding: Set up Eating/Feeding Details (indicate cue type and reason): pt able to utilize fork for self feeding while set upright in bed, increased difficulty noted with opening/closing of items as wellas cutting food d/t discomfort/limitations of R UE                                         Vision       Perception     Praxis      Cognition Arousal/Alertness: Awake/alert Behavior During Therapy: WFL for tasks assessed/performed Overall Cognitive Status: No family/caregiver present to determine baseline cognitive functioning                                            Exercises Exercises: Other exercises;Hand exercises (digits/wrist) General Exercises - Upper Extremity Shoulder Flexion: AAROM;Right;10 reps;Seated;PROM Hand Exercises Forearm Supination: AROM;Right;10 reps;Seated Wrist Flexion: AROM;PROM;Right;10 reps;Seated Wrist Extension: AROM;Self ROM;10  reps;Seated (gravity dependenct) Digit Composite Flexion: AROM;10 reps;Right;Seated Other Exercises Other Exercises: pt was guided through both self and gentle PROM/stretch to MCP/PIP DIP's of R UE with rest and increased time between sets, 2sets 10x each. improved tolerance following tenodesis pattern of movement.   Shoulder Instructions       General Comments      Pertinent Vitals/ Pain       Pain Assessment: 0-10 Pain Score: 2  Pain Location: with PROM of Rt UE Pain Descriptors / Indicators: Aching;Burning;Sore Pain Intervention(s): Limited activity within patient's tolerance;Repositioned  Home Living                                           Prior Functioning/Environment              Frequency  Min 2X/week        Progress Toward Goals  OT Goals(current goals can now be found in the care plan section)  Progress towards OT goals: Progressing toward goals  Acute Rehab OT Goals OT Goal Formulation: With patient Time For Goal Achievement: 01/17/21 Potential to Achieve Goals: Good  Plan Discharge plan remains appropriate    Co-evaluation                 AM-PAC OT "6 Clicks" Daily Activity     Outcome Measure   Help from another person eating meals?: A Little Help from another person taking care of personal grooming?: A Little Help from another person toileting, which includes using toliet, bedpan, or urinal?: Total Help from another person bathing (including washing, rinsing, drying)?: Total Help from another person to put on and taking off regular upper body clothing?: Total Help from another person to put on and taking off regular lower body clothing?: Total 6 Click Score: 10    End of Session    OT Visit Diagnosis: Muscle weakness (generalized) (M62.81);Pain Pain - Right/Left: Right Pain - part of body: Arm   Activity Tolerance Patient tolerated treatment well   Patient Left in bed;with call bell/phone within reach   Nurse Communication Mobility status        Time: 4196-2229 OT Time Calculation (min): 39 min  Charges: OT General Charges $OT Visit: 1 Visit OT Treatments $Self Care/Home Management : 8-22 mins $Therapeutic Exercise: 23-37 mins  Lielle Vandervort OTR/L acute rehab services Office: [phone]   [place] Moos Benuel Ly 01/05/2021, 9:15 AM

## 2021-01-06 DIAGNOSIS — Z7189 Other specified counseling: Secondary | ICD-10-CM | POA: Diagnosis not present

## 2021-01-06 DIAGNOSIS — B20 Human immunodeficiency virus [HIV] disease: Secondary | ICD-10-CM | POA: Diagnosis not present

## 2021-01-06 DIAGNOSIS — Z515 Encounter for palliative care: Secondary | ICD-10-CM | POA: Diagnosis not present

## 2021-01-06 DIAGNOSIS — N186 End stage renal disease: Secondary | ICD-10-CM | POA: Diagnosis not present

## 2021-01-06 DIAGNOSIS — L089 Local infection of the skin and subcutaneous tissue, unspecified: Secondary | ICD-10-CM | POA: Diagnosis not present

## 2021-01-06 DIAGNOSIS — D638 Anemia in other chronic diseases classified elsewhere: Secondary | ICD-10-CM | POA: Diagnosis not present

## 2021-01-06 LAB — CBC
HCT: 29.5 % — ABNORMAL LOW (ref 36.0–46.0)
Hemoglobin: 9 g/dL — ABNORMAL LOW (ref 12.0–15.0)
MCH: 31.1 pg (ref 26.0–34.0)
MCHC: 30.5 g/dL (ref 30.0–36.0)
MCV: 102.1 fL — ABNORMAL HIGH (ref 80.0–100.0)
Platelets: 174 10*3/uL (ref 150–400)
RBC: 2.89 MIL/uL — ABNORMAL LOW (ref 3.87–5.11)
RDW: 19 % — ABNORMAL HIGH (ref 11.5–15.5)
WBC: 4.6 10*3/uL (ref 4.0–10.5)
nRBC: 0 % (ref 0.0–0.2)

## 2021-01-06 LAB — BASIC METABOLIC PANEL
Anion gap: 7 (ref 5–15)
BUN: 23 mg/dL (ref 8–23)
CO2: 26 mmol/L (ref 22–32)
Calcium: 8.9 mg/dL (ref 8.9–10.3)
Chloride: 102 mmol/L (ref 98–111)
Creatinine, Ser: 5.46 mg/dL — ABNORMAL HIGH (ref 0.44–1.00)
GFR, Estimated: 8 mL/min — ABNORMAL LOW (ref >60)
Glucose, Bld: 80 mg/dL (ref 70–99)
Potassium: 4.4 mmol/L (ref 3.5–5.1)
Sodium: 135 mmol/L (ref 135–145)

## 2021-01-06 LAB — GLUCOSE, CAPILLARY
Glucose-Capillary: 82 mg/dL (ref 70–99)
Glucose-Capillary: 109 mg/dL — ABNORMAL HIGH (ref 70–99)
Glucose-Capillary: 81 mg/dL (ref 70–99)
Glucose-Capillary: 153 mg/dL — ABNORMAL HIGH (ref 70–99)

## 2021-01-06 LAB — VITAMIN B12: Vitamin B-12: 1517 pg/mL — ABNORMAL HIGH (ref 180–914)

## 2021-01-06 LAB — TSH: TSH: 3.806 u[IU]/mL (ref 0.350–4.500)

## 2021-01-06 MED ORDER — CHLORHEXIDINE GLUCONATE CLOTH 2 % EX PADS: 6.0000 | MEDICATED_PAD | Freq: Every day | CUTANEOUS | Status: DC

## 2021-01-06 NOTE — Progress Notes (Addendum)
[facility]  Subjective  - No new complaints with her right UE.     Objective (!) 171/76 63 98.3 F (36.8 C) (Oral) 16 100%  Intake/Output Summary (Last 24 hours) at 01/06/2021 1105 Last data filed at 01/05/2021 1700 Gross per 24 hour  Intake 240 ml  Output 0 ml  Net 240 ml         Assessment/Planning: POD # 7 I & D right UE with wound vac placement s/p Ligation and resection of right arm radiocephalic AV fistula  Plan to return to the OR tomorrow 01/07/21 for I & D with myriad skin graft and wound vac NPO past MN Called HD and ask for HD to be in the afternoon instead of AM  Roxy Horseman 01/06/2021 11:05 AM --  Laboratory Lab Results: Recent Labs    01/05/21 0635 01/06/21 0346  WBC 4.3 4.6  HGB 9.7* 9.0*  HCT 32.0* 29.5*  PLT 174 174   BMET Recent Labs    01/05/21 0635 01/06/21 0346  NA 133* 135  K 4.2 4.4  CL 99 102  CO2 25 26  GLUCOSE 76 80  BUN 11 23  CREATININE 3.92* 5.46*  CALCIUM 8.8* 8.9    COAG Lab Results  Component Value Date   INR 1.14 12/28/2011   INR 1.10 12/13/2010   INR 1.10 01/26/2010   No results found for: PTT   I have independently interviewed and examined patient and agree with PA assessment and plan above. Plan for OR tomorrow to place skin substitute.   [place] Donzetta Matters, MD [facility] Office: [phone] Pager: [phone]

## 2021-01-06 NOTE — Progress Notes (Signed)
Physical Therapy Treatment Patient Details Name: [name] MRN: [id number] DOB: 01-23-1951 Today's Date: 01/06/2021   History of Present Illness 70 y.o. female presents to [facility] hospital on 12/24/2020 with RUE fistula infection. Pt underwent temporary HDU catheter placement on 12/26/2020. S/p ligation and debridement 12/27/2020. PMH includes ESRD on HDU TTS, HIV, a flutter.    PT Comments    Pt making excellent progress towards her physical therapy goals; motivated and eager to participate. Pt ambulating hallway distances with a walker and min assist. Demonstrates decreased functional use of RUE, balance deficits, and weakness. D/c plan remains appropriate.     Recommendations for follow up therapy are one component of a multi-disciplinary discharge planning process, led by the attending physician.  Recommendations may be updated based on patient status, additional functional criteria and insurance authorization.  Follow Up Recommendations  Skilled nursing-short term rehab (<3 hours/day)     Assistance Recommended at Discharge Intermittent Supervision/Assistance  Equipment Recommendations  None recommended by PT    Recommendations for Other Services       Precautions / Restrictions Precautions Precautions: Fall Precaution Comments: R arm wound vac Restrictions Weight Bearing Restrictions: No     Mobility  Bed Mobility Overal bed mobility: Needs Assistance Bed Mobility: Supine to Sit     Supine to sit: Min guard     General bed mobility comments: Able to exit towards left side of bed without physical assist    Transfers Overall transfer level: Needs assistance Equipment used: Rolling walker (2 wheels);1 person hand held assist   Sit to Stand: Min assist           General transfer comment: MinA to rise to stand from edge of bed    Ambulation/Gait Ambulation/Gait assistance: Min guard;Min assist Gait Distance (Feet): 250 Feet Assistive device: Rolling  walker (2 wheels) Gait Pattern/deviations: Step-through pattern;Decreased stride length;Narrow base of support Gait velocity: decreased     General Gait Details: cues for wider BOS, intermittent minA for steering walker around obstacles   Stairs             Wheelchair Mobility    Modified Rankin (Stroke Patients Only)       Balance Overall balance assessment: Needs assistance Sitting-balance support: No upper extremity supported;Feet supported Sitting balance-Leahy Scale: Fair     Standing balance support: Bilateral upper extremity supported Standing balance-Leahy Scale: Poor                              Cognition Arousal/Alertness: Awake/alert Behavior During Therapy: WFL for tasks assessed/performed Overall Cognitive Status: No family/caregiver present to determine baseline cognitive functioning                                 General Comments: pleasant and motivated to participate        Exercises      General Comments        Pertinent Vitals/Pain Pain Assessment: Faces Faces Pain Scale: Hurts little more Pain Location: RUE Pain Descriptors / Indicators: Aching;Burning;Sore Pain Intervention(s): Limited activity within patient's tolerance;Monitored during session    Home Living                          Prior Function            PT Goals (current goals can now be found in the  care plan section) Acute Rehab PT Goals Patient Stated Goal: to reduce pain Potential to Achieve Goals: Fair Progress towards PT goals: Progressing toward goals    Frequency    Min 2X/week      PT Plan Current plan remains appropriate    Co-evaluation              AM-PAC PT "6 Clicks" Mobility   Outcome Measure  Help needed turning from your back to your side while in a flat bed without using bedrails?: A Little Help needed moving from lying on your back to sitting on the side of a flat bed without using bedrails?: A  Little Help needed moving to and from a bed to a chair (including a wheelchair)?: A Little Help needed standing up from a chair using your arms (e.g., wheelchair or bedside chair)?: A Little Help needed to walk in hospital room?: A Little Help needed climbing 3-5 steps with a railing? : A Lot 6 Click Score: 17    End of Session   Activity Tolerance: Patient tolerated treatment well Patient left: in chair;with call bell/phone within reach;with chair alarm set Nurse Communication: Mobility status PT Visit Diagnosis: Other abnormalities of gait and mobility (R26.89);Muscle weakness (generalized) (M62.81);Pain Pain - Right/Left: Right Pain - part of body: Arm     Time: 7048-8891 PT Time Calculation (min) (ACUTE ONLY): 29 min  Charges:  $Gait Training: 8-22 mins $Therapeutic Activity: 8-22 mins                     [name], PT, DPT Acute Rehabilitation Services Pager [phone] Office [phone]    [name] 01/06/2021, 3:57 PM

## 2021-01-06 NOTE — Progress Notes (Signed)
Occupational Therapy Treatment Patient Details Name: [name] MRN: [id number] DOB: Aug 20, 1950 Today's Date: 01/06/2021   History of present illness 70 y.o. female presents to [facility] hospital on 12/24/2020 with RUE fistula infection. Pt underwent temporary HDU catheter placement on 12/26/2020. S/p ligation and debridement 12/27/2020. PMH includes ESRD on HDU TTS, HIV, a flutter.   OT comments  Patient received in bed and stating she felt nauseous but was willing to address RUE HEP. Patient performed ROM exercises for RUE hand, wrist, and elbow with verbal cues to perform correctly. Patient began feeling better and asked to assist with breakfast. Patient required setup and used LUE to feed self while seated up in bed.  Acute OT to continue to follow.    Recommendations for follow up therapy are one component of a multi-disciplinary discharge planning process, led by the attending physician.  Recommendations may be updated based on patient status, additional functional criteria and insurance authorization.    Follow Up Recommendations  Long-term institutional care without follow-up therapy    Assistance Recommended at Discharge Frequent or constant Supervision/Assistance  Equipment Recommendations  None recommended by OT    Recommendations for Other Services      Precautions / Restrictions Precautions Precautions: Fall Precaution Comments: R arm wound vac Restrictions Weight Bearing Restrictions: No       Mobility Bed Mobility Overal bed mobility: Needs Assistance Bed Mobility: Rolling Rolling: Supervision         General bed mobility comments: performed positioning in bed to prepare for self feeding    Transfers                         Balance                                           ADL either performed or assessed with clinical judgement   ADL Overall ADL's : Needs assistance/impaired Eating/Feeding: Set up Eating/Feeding Details  (indicate cue type and reason): used LUE to feed self following setup with food cut up and items opened                                   General ADL Comments: performed self feeding sitting up in bed    Extremity/Trunk Assessment Upper Extremity Assessment Upper Extremity Assessment: Defer to OT evaluation            Vision       Perception     Praxis      Cognition Arousal/Alertness: Awake/alert Behavior During Therapy: WFL for tasks assessed/performed Overall Cognitive Status: No family/caregiver present to determine baseline cognitive functioning                                 General Comments: continues to require verbal cues for HEP          Exercises Exercises: Other exercises;Hand exercises (RUE HEP) Hand Exercises Forearm Supination: AROM;Right;10 reps;Supine Wrist Flexion: AROM;PROM;Right;10 reps;Supine Wrist Extension: AROM;Self ROM;10 reps;Supine Digit Composite Flexion: AROM;10 reps;Right;Supine   Shoulder Instructions       General Comments      Pertinent Vitals/ Pain       Pain Assessment: Faces Faces Pain Scale: Hurts even more Pain Location: with PROM  of Rt UE Pain Descriptors / Indicators: Aching;Burning;Sore Pain Intervention(s): Repositioned;Monitored during session  Home Living                                          Prior Functioning/Environment              Frequency  Min 2X/week        Progress Toward Goals  OT Goals(current goals can now be found in the care plan section)  Progress towards OT goals: Progressing toward goals  Acute Rehab OT Goals Patient Stated Goal: get better OT Goal Formulation: With patient Time For Goal Achievement: 01/17/21 Potential to Achieve Goals: Good ADL Goals Pt Will Perform Eating: with set-up;sitting Pt Will Perform Grooming: sitting;with supervision Pt Will Perform Upper Body Bathing: with min assist;sitting Pt Will Perform Upper  Body Dressing: with min assist;sitting Pt Will Transfer to Toilet: with mod assist;squat pivot transfer;bedside commode Pt Will Perform Toileting - Clothing Manipulation and hygiene: with mod assist;sitting/lateral leans Pt/caregiver will Perform Home Exercise Program: Increased ROM;Increased strength;Right Upper extremity;With written HEP provided;With Supervision Additional ADL Goal #1: Pt will be independent with positioning Rt UE  Plan Discharge plan remains appropriate    Co-evaluation                 AM-PAC OT "6 Clicks" Daily Activity     Outcome Measure   Help from another person eating meals?: A Little Help from another person taking care of personal grooming?: A Little Help from another person toileting, which includes using toliet, bedpan, or urinal?: Total Help from another person bathing (including washing, rinsing, drying)?: Total Help from another person to put on and taking off regular upper body clothing?: Total Help from another person to put on and taking off regular lower body clothing?: Total 6 Click Score: 10    End of Session    OT Visit Diagnosis: Muscle weakness (generalized) (M62.81);Pain Pain - Right/Left: Right Pain - part of body: Arm   Activity Tolerance Patient tolerated treatment well   Patient Left in bed;with call bell/phone within reach;with bed alarm set   Nurse Communication Other (comment) (on HEP for RUE and self feeding)        Time: 3329-5188 OT Time Calculation (min): 36 min  Charges: OT General Charges $OT Visit: 1 Visit OT Treatments $Self Care/Home Management : 8-22 mins $Therapeutic Exercise: 8-22 mins  .rlo  Trixie Dredge 01/06/2021, 9:23 AM

## 2021-01-06 NOTE — Progress Notes (Signed)
Nephrology Progress Note:   Patient ID: [name], female   DOB: [birth year], 70 y.o.   MRN: [id number]  S: last HD on 11/5 with 1 kg UF. Per vascular note plans are for OR tomorrow to place skin substitute.    Review of systems:  Denies shortness of breath or chest pain Had nausea one am but now resolved and not today   O:BP (!) 171/76   Pulse 63   Temp 98.3 F (36.8 C) (Oral)   Resp 16   Ht 5' 2.5" (1.588 m)   Wt 41.9 kg   LMP  (LMP Unknown)   SpO2 100%   BMI 16.63 kg/m   Intake/Output Summary (Last 24 hours) at 01/06/2021 1303 Last data filed at 01/05/2021 1700 Gross per 24 hour  Intake 120 ml  Output 0 ml  Net 120 ml   Intake/Output: I/O last 3 completed shifts: In: 240 [P.O.:240] Out: 50 [Drains:50]  Intake/Output this shift:  No intake/output data recorded. Weight change:    Physical exam:  General adult female in bed in no acute distress HEENT normocephalic atraumatic extraocular movements intact sclera anicteric Neck supple trachea midline Lungs clear to auscultation bilaterally normal work of breathing at rest  Heart S1S2 no rub Abdomen soft nontender nondistended Extremities no edema RUE with wound VAC in place Psych normal mood and affect Access: left IJ tunn catheter in place  Recent Labs  Lab 12/31/20 0806 01/01/21 0059 01/02/21 0349 01/03/21 0503 01/04/21 0436 01/05/21 0635 01/06/21 0346  NA 133* 133* 139 133* 131* 133* 135  K 3.9 4.8 3.9 3.7 4.2 4.2 4.4  CL 98 98 105 99 97* 99 102  CO2 22 22 27 27 27 25 26   GLUCOSE 81 134* 83 79 99 76 80  BUN 43* 51* 23 12 21 11 23   CREATININE 8.25* 9.17* 5.26* 3.89* 5.57* 3.92* 5.46*  CALCIUM 8.7* 8.5* 8.1* 8.8* 9.0 8.8* 8.9    CBC: Recent Labs  Lab 01/02/21 0349 01/03/21 0503 01/04/21 0436 01/05/21 0635 01/06/21 0346  WBC 4.3 4.3 4.8 4.3 4.6  HGB 9.0* 9.2* 9.8* 9.7* 9.0*  HCT 29.5* 30.3* 32.1* 32.0* 29.5*  MCV 101.4* 100.7* 101.6* 101.3* 102.1*  PLT 142* 159 174 174 174    CBG: Recent Labs  Lab 01/05/21 1127 01/05/21 1612 01/05/21 2152 01/06/21 0533 01/06/21 1113  GLUCAP 144* 97 88 82 109*    Iron Studies: No results for input(s): IRON, TIBC, TRANSFERRIN, FERRITIN in the last 72 hours. Studies/Results: No results found.  apixaban  2.5 mg Oral BID   calcitRIOL  1.25 mcg Oral Q T,Th,Sa-HD   Chlorhexidine Gluconate Cloth  6 each Topical Q0600   cinacalcet  60 mg Oral Q T,Th,Sa-HD   darbepoetin (ARANESP) injection - DIALYSIS  200 mcg Intravenous Q Thu-HD   dolutegravir  50 mg Oral q1600   fentaNYL  1 patch Transdermal I71I   folic acid  1 mg Oral Daily   lamiVUDine  50 mg Oral Daily   methadone  2.5 mg Oral Q2000   metoprolol tartrate  50 mg Oral BID   mirtazapine  15 mg Oral QHS   multivitamin  1 tablet Oral QHS   pantoprazole  40 mg Oral QAC breakfast   vancomycin variable dose per unstable renal function (pharmacist dosing)   Does not apply See admin instructions   venlafaxine XR  150 mg Oral Q breakfast   zidovudine  300 mg Oral Daily    BMET    Component Value  Date/Time   NA 135 01/06/2021 0346   K 4.4 01/06/2021 0346   CL 102 01/06/2021 0346   CO2 26 01/06/2021 0346   GLUCOSE 80 01/06/2021 0346   BUN 23 01/06/2021 0346   CREATININE 5.46 (H) 01/06/2021 0346   CREATININE 5.66 (H) 12/22/2019 1117   CALCIUM 8.9 01/06/2021 0346   CALCIUM 9.0 03/12/2011 0527   GFRNONAA 8 (L) 01/06/2021 0346   GFRNONAA 5 (L) 02/12/2015 1000   GFRAA 9 (L) 10/22/2019 0105   GFRAA 5 (L) 02/12/2015 1000   CBC    Component Value Date/Time   WBC 4.6 01/06/2021 0346   RBC 2.89 (L) 01/06/2021 0346   HGB 9.0 (L) 01/06/2021 0346   HCT 29.5 (L) 01/06/2021 0346   PLT 174 01/06/2021 0346   MCV 102.1 (H) 01/06/2021 0346   MCH 31.1 01/06/2021 0346   MCHC 30.5 01/06/2021 0346   RDW 19.0 (H) 01/06/2021 0346   LYMPHSABS 1.1 12/25/2020 0409   MONOABS 0.7 12/25/2020 0409   EOSABS 0.1 12/25/2020 0409   BASOSABS 0.0 12/25/2020 0409    Dialysis Orders:  Center: [facility]  on TTS . EDW 41.5kg HD Bath 2K/2.5Ca  Time 3 hours Heparin 1000 units bolus then 500 units/hr. 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  appreciate vascular surgery evaluation-  s/p ligation and debridement 10/28.  Now with wound VAC in place.  On vanc until December 14. MRSA on culture.  We have communicated to her dialysis unit that she will need vancomycin until December 14  ESRD -  Continue HD per her usual TTS schedule.  We have contacted her outpatient dialysis unit regarding antibiotics as above.  Noted plans for OR tomorrow - we will plan for HD in the afternoon after OR  Hypertension/volume  - UF as able.  She has prespecified max of 3.5 liters.  Home med metoprolol note also arrhythmia   Anemia  - Continue aranesp - is ordered 200 mcg weekly   Metabolic bone disease -  continue with calcitriol and sensipar AFib/FL:  improved with increased metoprolol dose HIV - regimen per primary team   Disposition per primary team - note vascular plans for OR on 11/8  Claudia Desanctis, MD 01/06/2021 1:23 PM

## 2021-01-06 NOTE — Progress Notes (Signed)
Palliative Medicine Inpatient Follow Up Note  Reason for Consultation: Establishing goals of care   HPI/Patient Profile: 70 y.o. female  with past medical history of ESRD on HD Tuesday Thursday Saturday, HIV, a flutter on Eliquis, AV fistula occlusion 4 weeks ago requiring balloon angioplasty and conversion of right forearm fistula to right upper extremity fistula presented with right upper extremity AV fistula infection and was transferred from [facility].  Vascular surgery, nephrology, IR and ID were consulted.  She underwent ligation of  AV fistula and I&D on 12/27/2020.  She had tunneled catheter placement on 12/30/2020.  She returned to the OR on 12/31/2020 by vascular surgery for washout and wound VAC placement.  Wound VAC changed on 01/03/2021. She was admitted on 12/24/2020 with right AV fistula infection, ESRD, A. fib/flutter, HIV, chronic pain, severe protein calorie malnutrition, and others.  Noted to have generalized deconditioning.    PMT was consulted for goals of care discussions.  Today's Discussion (01/06/2021):  *Please note that this is a verbal dictation therefore any spelling or grammatical errors are due to the "[employer]" system interpretation.  Chart reviewed.  I met with [name] at bedside. I asked her is she had given any thought to the conversation she had had with my colleague, [name] yesterday. She shared that she wants to understand resuscitation better in terms of what it entails. I reviewed with her what a true cardiopulmonary resuscitation event looks like and the reality of being on life support. She expresses, "I would not want that." I confirmed that she would not want CPR/Intubation/shocks. We completed a MOST form reflecting this as she would want illnesses treated as they arise. She would not want a feeding tube. She does presently wish to continue with treatments to improver he overall condition.   MOST form as below:  Cardiopulmonary  Resuscitation: Do Not Attempt Resuscitation (DNR/No CPR)  Medical Interventions: Limited Additional Interventions: Use medical treatment, IV fluids and cardiac monitoring as indicated, DO NOT USE intubation or mechanical ventilation. May consider use of less invasive airway support such as BiPAP or CPAP. Also provide comfort measures. Transfer to the hospital if indicated. Avoid intensive care.   Antibiotics: Determine use of limitation of antibiotics when infection occurs  IV Fluids: IV fluids for a defined trial period  Feeding Tube: No feeding tube    We reflected on [name]'s life and her daughters role in it. She shares that her daughter, [name] is not aware that she is in the hospital. She says "I don't know how to explain it all to her." We reviewed that the primary medical team would often be the ones to call and offer a medical update. She requested that this occur which I stated I would coordinate.   Otherwise, [name] expresses an anxiousness to get back to [facility].She also asks when she will see the Chaplain. I shared with her that she should see [name] later today which she was please by.   Palliative support provided.  Objective Assessment: Vital Signs Vitals:   01/06/21 0530 01/06/21 0933  BP: (!) 161/96 (!) 171/76  Pulse: (!) 56 63  Resp: 14 16  Temp: 98 F (36.7 C) 98.3 F (36.8 C)  SpO2: 100% 100%    Intake/Output Summary (Last 24 hours) at 01/06/2021 1104 Last data filed at 01/05/2021 1700 Gross per 24 hour  Intake 240 ml  Output 0 ml  Net 240 ml   Last Weight  Most recent update: 01/05/2021  5:03  AM    Weight  41.9 kg (92 lb 6 oz)            Physical Exam Vitals and nursing note reviewed.  Constitutional:      General: She is not in acute distress.    Appearance: She is obese. She is ill-appearing. She is not toxic-appearing.  HENT:     Head: Normocephalic and atraumatic.  Cardiovascular:     Rate and Rhythm: Normal rate.     Heart sounds: No murmur  heard. Pulmonary:     Effort: Pulmonary effort is normal. No respiratory distress.     Breath sounds: Normal breath sounds. No wheezing or rhonchi.  Abdominal:     General: Abdomen is flat.     Palpations: Abdomen is soft.  Skin:    General: Skin is warm and dry.     Comments: Nored wound vac in place right upper arm  Neurological:     General: No focal deficit present.     Mental Status: She is alert.  Psychiatric:        Mood and Affect: Mood normal.        Behavior: Behavior normal.   SUMMARY OF RECOMMENDATIONS   DNAR/DNI MOST Completed, paper copy placed onto the chart electric copy can be found in [facility] DNR Form Completed, paper copy placed onto the chart electric copy can be found in [facility] Treat the treatable Chaplain consult - Patient is anxious to speak to chaplain PMT will incrementally follow depending on need, please call if any urgent needs arise  Time Spent: 48 Greater than 50% of the time was spent in counseling and coordination of care ______________________________________________________________________________________ [place] Team Team Cell Phone: [phone] Please utilize secure chat with additional questions, if there is no response within 30 minutes please call the above phone number  Palliative Medicine Team providers are available by phone from 7am to 7pm daily and can be reached through the team cell phone.  Should this patient require assistance outside of these hours, please call the patient's attending physician.

## 2021-01-06 NOTE — Plan of Care (Signed)
  Problem: Clinical Measurements: Goal: Ability to maintain clinical measurements within normal limits will improve Outcome: Progressing   

## 2021-01-06 NOTE — Progress Notes (Signed)
Patient ID: [name], female   DOB: 10-08-1950, 70 y.o.   MRN: [id number]  PROGRESS NOTE    [name]  BVQ:[id number] DOB: September 05, 1950 DOA: 12/24/2020 PCP: Hilbert Corrigan, MD   Brief Narrative:  70 year old with history of ESRD on HD Tuesday Thursday Saturday, HIV, a flutter on Eliquis, AV fistula occlusion 4 weeks ago requiring balloon angioplasty and conversion of right forearm fistula to right upper extremity fistula presented with right upper extremity AV fistula infection and was transferred from [facility].  Vascular surgery, nephrology, IR and ID were consulted.  She underwent ligation of  AV fistula and I&D on 12/27/2020.  She had tunneled catheter placement on 12/30/2020.  She returned to the OR on 12/31/2020 by vascular surgery for washout and wound VAC placement.  Wound VAC changed on 01/03/2021.  Assessment & Plan:   Right AV fistula infection -Status post I&D/ligation of AV fistula on 12/27/2020.  Status post OR on 12/31/2020 for washout and wound VAC placement -Wound VAC changed on 01/03/2021.  Vascular surgery is planning for wound VAC change on 01/06/2021: If no improvement, might need plastic surgery improvement. -ID recommended IV vancomycin with hemodialysis for total of 6 weeks; last day would be 02/12/2021 [facility] care as per vascular surgery -Continue pain control  End-stage renal disease on hemodialysis -Nephrology following.  Dialysis as per nephrology schedule  Atrial fibrillation/flutter -Currently intermittently bradycardic.  Continue metoprolol and apixaban  HIV -CD4 252.  Continue antiretrovirals.  Outpatient follow-up with ID  Anemia of chronic disease -Hemoglobin stable.  Monitor intermittently  History of chronic pain syndrome -On fentanyl patch and methadone.  Outpatient follow-up with pain management  Severe protein calorie malnutrition -Encourage oral intake.   -Palliative care following for goals of care discussion:  Remains Full code.  Patient would not want to have feeding tube placement.  Hyponatremia -Being managed with dialysis by nephrology  Macrocytosis -B12 level normal.  Continue folic acid supplementation empirically.  Generalized deconditioning - will need SNF placement eventually.   DVT prophylaxis: Eliquis Code Status: Full Family Communication: None at bedside Disposition Plan: Status is: Inpatient  Remains inpatient appropriate because: Need for further vascular surgery intervention  Consultants: Vascular surgery/nephrology/ID/IR  Procedures: As above  Antimicrobials:  Anti-infectives (From admission, onward)    Start     Dose/Rate Route Frequency Ordered Stop   01/04/21 1200  vancomycin (VANCOREADY) IVPB 500 mg/100 mL  Status:  Discontinued        500 mg 100 mL/hr over 60 Minutes Intravenous Once in dialysis 01/04/21 0915 01/05/21 0759   12/31/20 1200  vancomycin (VANCOCIN) IVPB 500 mg/100 ml premix  Status:  Discontinued        500 mg 100 mL/hr over 60 Minutes Intravenous Every T-Th-Sa (Hemodialysis) 12/28/20 0827 12/30/20 1234   12/31/20 0955  vancomycin variable dose per unstable renal function (pharmacist dosing)         Does not apply See admin instructions 12/31/20 0955     12/30/20 1128  vancomycin (VANCOCIN) IVPB 1000 mg/200 mL premix        over 60 Minutes Intravenous Continuous PRN 12/30/20 1148 12/30/20 1128   12/30/20 1028  vancomycin (VANCOCIN) 1-5 GM/200ML-% IVPB       Note to Pharmacy: Lytle Butte   : cabinet override      12/30/20 1028 12/30/20 2244   12/30/20 0000  vancomycin (VANCOCIN) IVPB 1000 mg/200 mL premix        1,000 mg 200 mL/hr over 60 Minutes  Intravenous To Radiology 12/29/20 1348 12/30/20 0118   12/27/20 1347  vancomycin (VANCOCIN) 1-5 GM/200ML-% IVPB       Note to Pharmacy: [facility], [place]   : cabinet override      12/27/20 1347 12/27/20 1420   12/27/20 1345  vancomycin (VANCOCIN) IVPB 1000 mg/200 mL premix        1,000 mg 200 mL/hr  over 60 Minutes Intravenous To Surgery 12/27/20 1335 12/27/20 1436   12/27/20 0600  ceFAZolin (ANCEF) IVPB 2g/100 mL premix  Status:  Discontinued        2 g 200 mL/hr over 30 Minutes Intravenous To Radiology 12/26/20 1016 12/26/20 1055   12/26/20 1630  vancomycin (VANCOCIN) IVPB 500 mg/100 ml premix        500 mg 100 mL/hr over 60 Minutes Intravenous  Once 12/26/20 1455 12/26/20 1839   12/26/20 1345  lamiVUDine (EPIVIR) 10 MG/ML solution 50 mg        50 mg Oral Daily 12/26/20 1254     12/26/20 1115  zidovudine (RETROVIR) capsule 300 mg        300 mg Oral Daily 12/26/20 1023     12/25/20 2000  ceFEPIme (MAXIPIME) 1 g in sodium chloride 0.9 % 100 mL IVPB  Status:  Discontinued        1 g 200 mL/hr over 30 Minutes Intravenous Every 24 hours 12/25/20 1142 12/29/20 1002   12/25/20 1600  dolutegravir (TIVICAY) tablet 50 mg        50 mg Oral Daily-1600 12/25/20 0918     12/25/20 1318  vancomycin variable dose per unstable renal function (pharmacist dosing)  Status:  Discontinued         Does not apply See admin instructions 12/25/20 1319 12/28/20 0827   12/25/20 0845  vancomycin (VANCOCIN) IVPB 1000 mg/200 mL premix  Status:  Discontinued        1,000 mg 200 mL/hr over 60 Minutes Intravenous  Once 12/25/20 0833 12/25/20 0842   12/25/20 0845  ceFEPIme (MAXIPIME) 2 g in sodium chloride 0.9 % 100 mL IVPB  Status:  Discontinued        2 g 200 mL/hr over 30 Minutes Intravenous  Once 12/25/20 0833 12/25/20 0843   12/24/20 1945  zidovudine (RETROVIR) capsule 100 mg  Status:  Discontinued       Note to Pharmacy: (0900, 1300, & 2100)     100 mg Oral 3 times daily 12/24/20 1827 12/26/20 1023   12/24/20 1830  vancomycin (VANCOCIN) IVPB 1000 mg/200 mL premix        1,000 mg 200 mL/hr over 60 Minutes Intravenous  Once 12/24/20 1719 12/24/20 1905   12/24/20 1830  lamivudine (EPIVIR) tablet 50 mg  Status:  Discontinued       Note to Pharmacy: (0800)     50 mg Oral Daily 12/24/20 1827 12/26/20 1254    12/24/20 1800  ceFEPIme (MAXIPIME) 2 g in sodium chloride 0.9 % 100 mL IVPB        2 g 200 mL/hr over 30 Minutes Intravenous  Once 12/24/20 1718 12/24/20 1800        Subjective: Patient seen and examined at bedside.  Poor historian.  No seizures, fever, vomiting, worsening shortness of breath reported.  Oral intake is poor. Objective: Vitals:   01/05/21 0921 01/05/21 1730 01/05/21 2155 01/06/21 0530  BP: (!) 158/90 (!) 149/99 (!) 162/86 (!) 161/96  Pulse: 60 67 62 (!) 56  Resp: 18 18 14 14   Temp: 98.8 F (  37.1 C) 98.6 F (37 C) 98.1 F (36.7 C) 98 F (36.7 C)  TempSrc: Oral Oral Oral Oral  SpO2: 96% 98% 100% 100%  Weight:      Height:        Intake/Output Summary (Last 24 hours) at 01/06/2021 0726 Last data filed at 01/05/2021 1700 Gross per 24 hour  Intake 240 ml  Output 0 ml  Net 240 ml    Filed Weights   01/04/21 0723 01/04/21 1055 01/05/21 0407  Weight: 42.4 kg 41.4 kg 41.9 kg    Examination:  General exam: No distress.  On room air currently.  Looks chronically ill and deconditioned. Extremely thinly built.  Extremely poor historian. Respiratory system: Decreased breath sounds at bases bilaterally with some crackles  cardiovascular system: Currently intermittently bradycardic; S1-S2 heard  gastrointestinal system: Abdomen is distended slightly, soft and nontender.  Bowel sounds are heard  extremities: Mild lower extremity edema present; no clubbing  Central nervous system: Sleepy, wakes up slightly, extremely slow to respond.  No focal neurological deficits.  Moves extremities Skin: Right upper extremity wound VAC with dressing present.  No other ecchymosis Psychiatry: Flat affect.  Hardly participates in any communication..   Data Reviewed: I have personally reviewed following labs and imaging studies  CBC: Recent Labs  Lab 01/02/21 0349 01/03/21 0503 01/04/21 0436 01/05/21 0635 01/06/21 0346  WBC 4.3 4.3 4.8 4.3 4.6  HGB 9.0* 9.2* 9.8* 9.7* 9.0*   HCT 29.5* 30.3* 32.1* 32.0* 29.5*  MCV 101.4* 100.7* 101.6* 101.3* 102.1*  PLT 142* 159 174 174 916    Basic Metabolic Panel: Recent Labs  Lab 12/30/20 1313 12/31/20 0806 01/01/21 0059 01/02/21 0349 01/03/21 0503 01/04/21 0436 01/05/21 0635 01/06/21 0346  NA  --    < > 133* 139 133* 131* 133* 135  K  --    < > 4.8 3.9 3.7 4.2 4.2 4.4  CL  --    < > 98 105 99 97* 99 102  CO2  --    < > 22 27 27 27 25 26   GLUCOSE  --    < > 134* 83 79 99 76 80  BUN  --    < > 51* 23 12 21 11 23   CREATININE  --    < > 9.17* 5.26* 3.89* 5.57* 3.92* 5.46*  CALCIUM  --    < > 8.5* 8.1* 8.8* 9.0 8.8* 8.9  MG  --    < > 2.2 2.0 2.1 2.0 2.1  --   PHOS 5.0*  --   --   --   --   --   --   --    < > = values in this interval not displayed.    GFR: Estimated Creatinine Clearance: 6.6 mL/min (A) (by C-G formula based on SCr of 5.46 mg/dL (H)). Liver Function Tests: No results for input(s): AST, ALT, ALKPHOS, BILITOT, PROT, ALBUMIN in the last 168 hours. No results for input(s): LIPASE, AMYLASE in the last 168 hours. No results for input(s): AMMONIA in the last 168 hours. Coagulation Profile: No results for input(s): INR, PROTIME in the last 168 hours. Cardiac Enzymes: No results for input(s): CKTOTAL, CKMB, CKMBINDEX, TROPONINI in the last 168 hours. BNP (last 3 results) No results for input(s): PROBNP in the last 8760 hours. HbA1C: No results for input(s): HGBA1C in the last 72 hours. CBG: Recent Labs  Lab 01/05/21 0405 01/05/21 1127 01/05/21 1612 01/05/21 2152 01/06/21 0533  GLUCAP 143* 144* 97 88  82    Lipid Profile: No results for input(s): CHOL, HDL, LDLCALC, TRIG, CHOLHDL, LDLDIRECT in the last 72 hours. Thyroid Function Tests: Recent Labs    01/06/21 0346  TSH 3.806   Anemia Panel: No results for input(s): VITAMINB12, FOLATE, FERRITIN, TIBC, IRON, RETICCTPCT in the last 72 hours. Sepsis Labs: No results for input(s): PROCALCITON, LATICACIDVEN in the last 168  hours.  Recent Results (from the past 240 hour(s))  Aerobic Culture w Gram Stain (superficial specimen)     Status: None   Collection Time: 12/27/20 12:19 PM   Specimen: Abscess; Wound  Result Value Ref Range Status   Specimen Description ABSCESS  Final   Special Requests ARM RIGHT  Final   Gram Stain   Final    FEW WBC PRESENT,BOTH PMN AND MONONUCLEAR FEW GRAM POSITIVE COCCI IN PAIRS Performed at [facility], [address], [place], [place] [address]    Culture   Final    MODERATE METHICILLIN RESISTANT STAPHYLOCOCCUS AUREUS   Report Status 12/29/2020 FINAL  Final   Organism ID, Bacteria METHICILLIN RESISTANT STAPHYLOCOCCUS AUREUS  Final      Susceptibility   Methicillin resistant staphylococcus aureus - MIC*    CIPROFLOXACIN >=8 RESISTANT Resistant     ERYTHROMYCIN >=8 RESISTANT Resistant     GENTAMICIN <=0.5 SENSITIVE Sensitive     OXACILLIN >=4 RESISTANT Resistant     TETRACYCLINE <=1 SENSITIVE Sensitive     VANCOMYCIN 1 SENSITIVE Sensitive     TRIMETH/SULFA <=10 SENSITIVE Sensitive     CLINDAMYCIN <=0.25 SENSITIVE Sensitive     RIFAMPIN <=0.5 SENSITIVE Sensitive     Inducible Clindamycin NEGATIVE Sensitive     * MODERATE METHICILLIN RESISTANT STAPHYLOCOCCUS AUREUS  Aerobic/Anaerobic Culture w Gram Stain (surgical/deep wound)     Status: None   Collection Time: 12/27/20  2:39 PM   Specimen: Wound; Tissue  Result Value Ref Range Status   Specimen Description TISSUE  Final   Special Requests RIGHT ARM WOUND FOR CULTURES SPEC A  Final   Gram Stain   Final    RARE WBC PRESENT, PREDOMINANTLY PMN RARE GRAM POSITIVE COCCI IN PAIRS    Culture   Final    RARE METHICILLIN RESISTANT STAPHYLOCOCCUS AUREUS NO ANAEROBES ISOLATED Performed at [facility], [place] [address] [place], [place] [address]    Report Status 01/01/2021 FINAL  Final   Organism ID, Bacteria METHICILLIN RESISTANT STAPHYLOCOCCUS AUREUS  Final      Susceptibility   Methicillin resistant  staphylococcus aureus - MIC*    CIPROFLOXACIN >=8 RESISTANT Resistant     ERYTHROMYCIN >=8 RESISTANT Resistant     GENTAMICIN <=0.5 SENSITIVE Sensitive     OXACILLIN >=4 RESISTANT Resistant     TETRACYCLINE <=1 SENSITIVE Sensitive     VANCOMYCIN 1 SENSITIVE Sensitive     TRIMETH/SULFA <=10 SENSITIVE Sensitive     CLINDAMYCIN <=0.25 SENSITIVE Sensitive     RIFAMPIN <=0.5 SENSITIVE Sensitive     Inducible Clindamycin NEGATIVE Sensitive     * RARE METHICILLIN RESISTANT STAPHYLOCOCCUS AUREUS          Radiology Studies: No results found.      Scheduled Meds:  apixaban  2.5 mg Oral BID   calcitRIOL  1.25 mcg Oral Q T,Th,Sa-HD   Chlorhexidine Gluconate Cloth  6 each Topical Q0600   cinacalcet  60 mg Oral Q T,Th,Sa-HD   darbepoetin (ARANESP) injection - DIALYSIS  200 mcg Intravenous Q Thu-HD   dolutegravir  50 mg Oral q1600  fentaNYL  1 patch Transdermal G68K   folic acid  1 mg Oral Daily   lamiVUDine  50 mg Oral Daily   methadone  2.5 mg Oral Q2000   metoprolol tartrate  50 mg Oral BID   mirtazapine  15 mg Oral QHS   multivitamin  1 tablet Oral QHS   pantoprazole  40 mg Oral QAC breakfast   vancomycin variable dose per unstable renal function (pharmacist dosing)   Does not apply See admin instructions   venlafaxine XR  150 mg Oral Q breakfast   zidovudine  300 mg Oral Daily   Continuous Infusions:        Aline August, MD [facility] 01/06/2021, 7:26 AM

## 2021-01-07 ENCOUNTER — Inpatient Hospital Stay (HOSPITAL_COMMUNITY): Payer: Medicare Other | Admitting: Certified Registered Nurse Anesthetist

## 2021-01-07 ENCOUNTER — Encounter (HOSPITAL_COMMUNITY): Payer: Self-pay | Admitting: Family Medicine

## 2021-01-07 ENCOUNTER — Encounter (HOSPITAL_COMMUNITY)
Admission: EM | Disposition: A | Discharge status: Skilled Nursing Facility | Payer: Self-pay | Source: Home / Self Care | Attending: Internal Medicine

## 2021-01-07 DIAGNOSIS — L089 Local infection of the skin and subcutaneous tissue, unspecified: Secondary | ICD-10-CM | POA: Diagnosis not present

## 2021-01-07 DIAGNOSIS — Z9889 Other specified postprocedural states: Secondary | ICD-10-CM | POA: Diagnosis not present

## 2021-01-07 DIAGNOSIS — Z992 Dependence on renal dialysis: Secondary | ICD-10-CM | POA: Diagnosis not present

## 2021-01-07 DIAGNOSIS — T827XXA Infection and inflammatory reaction due to other cardiac and vascular devices, implants and grafts, initial encounter: Secondary | ICD-10-CM | POA: Diagnosis not present

## 2021-01-07 DIAGNOSIS — B20 Human immunodeficiency virus [HIV] disease: Secondary | ICD-10-CM | POA: Diagnosis not present

## 2021-01-07 DIAGNOSIS — N186 End stage renal disease: Secondary | ICD-10-CM | POA: Diagnosis not present

## 2021-01-07 HISTORY — PX: APPLICATION OF WOUND VAC: SHX5189

## 2021-01-07 HISTORY — PX: SKIN SPLIT GRAFT: SHX444

## 2021-01-07 HISTORY — PX: INCISION AND DRAINAGE: SHX5863

## 2021-01-07 LAB — BASIC METABOLIC PANEL
Anion gap: 9 (ref 5–15)
BUN: 34 mg/dL — ABNORMAL HIGH (ref 8–23)
CO2: 28 mmol/L (ref 22–32)
Calcium: 8.9 mg/dL (ref 8.9–10.3)
Chloride: 99 mmol/L (ref 98–111)
Creatinine, Ser: 7.06 mg/dL — ABNORMAL HIGH (ref 0.44–1.00)
GFR, Estimated: 6 mL/min — ABNORMAL LOW (ref >60)
Glucose, Bld: 72 mg/dL (ref 70–99)
Potassium: 4.8 mmol/L (ref 3.5–5.1)
Sodium: 136 mmol/L (ref 135–145)

## 2021-01-07 LAB — GLUCOSE, CAPILLARY
Glucose-Capillary: 97 mg/dL (ref 70–99)
Glucose-Capillary: 86 mg/dL (ref 70–99)
Glucose-Capillary: 75 mg/dL (ref 70–99)
Glucose-Capillary: 94 mg/dL (ref 70–99)
Glucose-Capillary: 140 mg/dL — ABNORMAL HIGH (ref 70–99)

## 2021-01-07 LAB — CBC
HCT: 30.1 % — ABNORMAL LOW (ref 36.0–46.0)
Hemoglobin: 9.1 g/dL — ABNORMAL LOW (ref 12.0–15.0)
MCH: 31.2 pg (ref 26.0–34.0)
MCHC: 30.2 g/dL (ref 30.0–36.0)
MCV: 103.1 fL — ABNORMAL HIGH (ref 80.0–100.0)
Platelets: 192 10*3/uL (ref 150–400)
RBC: 2.92 MIL/uL — ABNORMAL LOW (ref 3.87–5.11)
RDW: 18.9 % — ABNORMAL HIGH (ref 11.5–15.5)
WBC: 4.3 10*3/uL (ref 4.0–10.5)
nRBC: 0 % (ref 0.0–0.2)

## 2021-01-07 LAB — HEPATITIS B SURFACE ANTIBODY,QUALITATIVE: Hep B S Ab: REACTIVE — AB

## 2021-01-07 LAB — HEPATITIS B SURFACE ANTIGEN: Hepatitis B Surface Ag: NONREACTIVE

## 2021-01-07 SURGERY — INCISION AND DRAINAGE
Anesthesia: General | Laterality: Right

## 2021-01-07 MED ORDER — FENTANYL CITRATE (PF) 250 MCG/5ML IJ SOLN
INTRAMUSCULAR | Status: AC
  Filled 2021-01-07: qty 5

## 2021-01-07 MED ORDER — PROPOFOL 10 MG/ML IV BOLUS
INTRAVENOUS | Status: DC | PRN
  Administered 2021-01-07: 80 mg via INTRAVENOUS

## 2021-01-07 MED ORDER — CHLORHEXIDINE GLUCONATE 0.12 % MT SOLN
15.0000 mL | OROMUCOSAL | Status: AC
  Filled 2021-01-07: qty 15

## 2021-01-07 MED ORDER — EPHEDRINE 5 MG/ML INJ
INTRAVENOUS | Status: AC
  Filled 2021-01-07: qty 5

## 2021-01-07 MED ORDER — 0.9 % SODIUM CHLORIDE (POUR BTL) OPTIME
TOPICAL | Status: DC | PRN
  Administered 2021-01-07: 1000 mL

## 2021-01-07 MED ORDER — FENTANYL CITRATE (PF) 250 MCG/5ML IJ SOLN
INTRAMUSCULAR | Status: DC | PRN
  Administered 2021-01-07: 50 ug via INTRAVENOUS
  Administered 2021-01-07 (×2): 25 ug via INTRAVENOUS

## 2021-01-07 MED ORDER — HEPARIN SODIUM (PORCINE) 1000 UNIT/ML IJ SOLN
INTRAMUSCULAR | Status: AC
  Administered 2021-01-07: 1000 [IU]
  Filled 2021-01-07: qty 4

## 2021-01-07 MED ORDER — DEXAMETHASONE SODIUM PHOSPHATE 10 MG/ML IJ SOLN
INTRAMUSCULAR | Status: DC | PRN
  Administered 2021-01-07: 5 mg via INTRAVENOUS

## 2021-01-07 MED ORDER — ONDANSETRON HCL 4 MG/2ML IJ SOLN
INTRAMUSCULAR | Status: DC | PRN
  Administered 2021-01-07: 4 mg via INTRAVENOUS

## 2021-01-07 MED ORDER — CHLORHEXIDINE GLUCONATE 0.12 % MT SOLN
OROMUCOSAL | Status: AC
  Administered 2021-01-07: 15 mL via OROMUCOSAL
  Filled 2021-01-07: qty 15

## 2021-01-07 MED ORDER — DEXAMETHASONE SODIUM PHOSPHATE 10 MG/ML IJ SOLN
INTRAMUSCULAR | Status: AC
  Filled 2021-01-07: qty 1

## 2021-01-07 MED ORDER — LIDOCAINE 2% (20 MG/ML) 5 ML SYRINGE
INTRAMUSCULAR | Status: DC | PRN
  Administered 2021-01-07: 40 mg via INTRAVENOUS

## 2021-01-07 MED ORDER — EPHEDRINE SULFATE-NACL 50-0.9 MG/10ML-% IV SOSY
PREFILLED_SYRINGE | INTRAVENOUS | Status: DC | PRN
Start: 2021-01-07 — End: 2021-01-07
  Administered 2021-01-07 (×2): 5 mg via INTRAVENOUS

## 2021-01-07 MED ORDER — ONDANSETRON HCL 4 MG/2ML IJ SOLN
INTRAMUSCULAR | Status: AC
  Filled 2021-01-07: qty 2

## 2021-01-07 MED ORDER — LIDOCAINE 2% (20 MG/ML) 5 ML SYRINGE
INTRAMUSCULAR | Status: AC
  Filled 2021-01-07: qty 5

## 2021-01-07 MED ORDER — SODIUM CHLORIDE 0.9 % IV SOLN: INTRAVENOUS | Status: DC

## 2021-01-07 MED ORDER — PROPOFOL 10 MG/ML IV BOLUS
INTRAVENOUS | Status: AC
  Filled 2021-01-07: qty 20

## 2021-01-07 SURGICAL SUPPLY — 33 items
BAG COUNTER SPONGE SURGICOUNT (BAG) ×2 IMPLANT
CANISTER SUCT 3000ML PPV (MISCELLANEOUS) ×4 IMPLANT
CANISTER WOUND CARE 500ML ATS (WOUND CARE) ×2 IMPLANT
CLIP LIGATING EXTRA MED SLVR (CLIP) ×2 IMPLANT
CLIP LIGATING EXTRA SM BLUE (MISCELLANEOUS) ×2 IMPLANT
CONNECTOR Y ATS VAC SYSTEM (MISCELLANEOUS) ×2 IMPLANT
COVER SURGICAL LIGHT HANDLE (MISCELLANEOUS) ×2 IMPLANT
DRSG CUTIMED SORBACT 7X9 (GAUZE/BANDAGES/DRESSINGS) ×2 IMPLANT
DRSG VAC ATS LRG SENSATRAC (GAUZE/BANDAGES/DRESSINGS) IMPLANT
DRSG VAC ATS MED SENSATRAC (GAUZE/BANDAGES/DRESSINGS) IMPLANT
DRSG VAC ATS SM SENSATRAC (GAUZE/BANDAGES/DRESSINGS) ×2 IMPLANT
ELECT REM PT RETURN 9FT ADLT (ELECTROSURGICAL) ×2
ELECTRODE REM PT RTRN 9FT ADLT (ELECTROSURGICAL) ×1 IMPLANT
GLOVE SURG ENC MOIS LTX SZ7.5 (GLOVE) ×2 IMPLANT
GOWN STRL REUS W/ TWL LRG LVL3 (GOWN DISPOSABLE) ×3 IMPLANT
GOWN STRL REUS W/ TWL XL LVL3 (GOWN DISPOSABLE) ×1 IMPLANT
GOWN STRL REUS W/TWL LRG LVL3 (GOWN DISPOSABLE) ×6
GOWN STRL REUS W/TWL XL LVL3 (GOWN DISPOSABLE) ×2
GRAFT MYRIAD 3 LAYER 7X10 (Graft) ×2 IMPLANT
HANDPIECE INTERPULSE COAX TIP (DISPOSABLE)
KIT BASIN OR (CUSTOM PROCEDURE TRAY) ×2 IMPLANT
KIT TURNOVER KIT B (KITS) ×2 IMPLANT
NS IRRIG 1000ML POUR BTL (IV SOLUTION) ×2 IMPLANT
PACK GENERAL/GYN (CUSTOM PROCEDURE TRAY) ×2 IMPLANT
PACK UNIVERSAL I (CUSTOM PROCEDURE TRAY) IMPLANT
PAD ARMBOARD 7.5X6 YLW CONV (MISCELLANEOUS) ×4 IMPLANT
PAD NEG PRESSURE SENSATRAC (MISCELLANEOUS) ×4 IMPLANT
POWDER MYRIAD MORCELLS 500MG (Miscellaneous) ×2 IMPLANT
SET HNDPC FAN SPRY TIP SCT (DISPOSABLE) IMPLANT
SUT ETHILON 4 0 PS 2 18 (SUTURE) ×2 IMPLANT
SUT VIC AB 4-0 PS2 18 (SUTURE) ×4 IMPLANT
TOWEL GREEN STERILE (TOWEL DISPOSABLE) ×2 IMPLANT
WATER STERILE IRR 1000ML POUR (IV SOLUTION) IMPLANT

## 2021-01-07 NOTE — Progress Notes (Signed)
Patient ID: [name], female   DOB: 21-Apr-1950, 70 y.o.   MRN: [id number]  PROGRESS NOTE    [name]  KZS:[id number] DOB: 06-18-1950 DOA: 12/24/2020 PCP: Hilbert Corrigan, MD   Brief Narrative:  70 year old with history of ESRD on HD Tuesday Thursday Saturday, HIV, a flutter on Eliquis, AV fistula occlusion 4 weeks ago requiring balloon angioplasty and conversion of right forearm fistula to right upper extremity fistula presented with right upper extremity AV fistula infection and was transferred from [facility].  Vascular surgery, nephrology, IR and ID were consulted.  She underwent ligation of  AV fistula and I&D on 12/27/2020.  She had tunneled catheter placement on 12/30/2020.  She returned to the OR on 12/31/2020 by vascular surgery for washout and wound VAC placement.  Wound VAC changed on 01/03/2021.  Vascular surgery is planning for OR on 01/07/2021 to place skin substitute.  Palliative care also following: CODE STATUS has been changed to DNR.  Assessment & Plan:   Right AV fistula infection -Status post I&D/ligation of AV fistula on 12/27/2020.  Status post OR on 12/31/2020 for washout and wound VAC placement -Vascular surgery is planning for OR on 01/07/2021 to place skin substitute. -ID recommended IV vancomycin with hemodialysis for total of 6 weeks; last day would be 02/12/2021 [facility] care as per vascular surgery -Continue pain control  End-stage renal disease on hemodialysis -Nephrology following.  Dialysis as per nephrology schedule  Atrial fibrillation/flutter -Currently intermittently bradycardic.  Continue metoprolol and apixaban  HIV -CD4 252.  Continue antiretrovirals.  Outpatient follow-up with ID  Anemia of chronic disease -Hemoglobin stable.  Monitor intermittently  History of chronic pain syndrome -On fentanyl patch and methadone.  Outpatient follow-up with pain management  Severe protein calorie malnutrition -Encourage oral  intake.   -Palliative care following for goals of care discussion: CODE STATUS has been changed to DNR.  Patient would not want to have feeding tube placement.  Hyponatremia -Improved  Macrocytosis -B12 level normal.  Continue folic acid supplementation empirically.  Generalized deconditioning - will need SNF placement eventually.   DVT prophylaxis: Eliquis Code Status: Full Family Communication: None at bedside Disposition Plan: Status is: Inpatient  Remains inpatient appropriate because: Need for further vascular surgery intervention  Consultants: Vascular surgery/nephrology/ID/IR/palliative care  Procedures: As above  Antimicrobials:  Anti-infectives (From admission, onward)    Start     Dose/Rate Route Frequency Ordered Stop   01/04/21 1200  vancomycin (VANCOREADY) IVPB 500 mg/100 mL  Status:  Discontinued        500 mg 100 mL/hr over 60 Minutes Intravenous Once in dialysis 01/04/21 0915 01/05/21 0759   12/31/20 1200  vancomycin (VANCOCIN) IVPB 500 mg/100 ml premix  Status:  Discontinued        500 mg 100 mL/hr over 60 Minutes Intravenous Every T-Th-Sa (Hemodialysis) 12/28/20 0827 12/30/20 1234   12/31/20 0955  vancomycin variable dose per unstable renal function (pharmacist dosing)         Does not apply See admin instructions 12/31/20 0955     12/30/20 1128  vancomycin (VANCOCIN) IVPB 1000 mg/200 mL premix        over 60 Minutes Intravenous Continuous PRN 12/30/20 1148 12/30/20 1128   12/30/20 1028  vancomycin (VANCOCIN) 1-5 GM/200ML-% IVPB       Note to Pharmacy: Lytle Butte   : cabinet override      12/30/20 1028 12/30/20 2244   12/30/20 0000  vancomycin (VANCOCIN) IVPB 1000 mg/200 mL premix  1,000 mg 200 mL/hr over 60 Minutes Intravenous To Radiology 12/29/20 1348 12/30/20 0118   12/27/20 1347  vancomycin (VANCOCIN) 1-5 GM/200ML-% IVPB       Note to Pharmacy: [facility], [place]   : cabinet override      12/27/20 1347 12/27/20 1420   12/27/20 1345   vancomycin (VANCOCIN) IVPB 1000 mg/200 mL premix        1,000 mg 200 mL/hr over 60 Minutes Intravenous To Surgery 12/27/20 1335 12/27/20 1436   12/27/20 0600  ceFAZolin (ANCEF) IVPB 2g/100 mL premix  Status:  Discontinued        2 g 200 mL/hr over 30 Minutes Intravenous To Radiology 12/26/20 1016 12/26/20 1055   12/26/20 1630  vancomycin (VANCOCIN) IVPB 500 mg/100 ml premix        500 mg 100 mL/hr over 60 Minutes Intravenous  Once 12/26/20 1455 12/26/20 1839   12/26/20 1345  lamiVUDine (EPIVIR) 10 MG/ML solution 50 mg        50 mg Oral Daily 12/26/20 1254     12/26/20 1115  zidovudine (RETROVIR) capsule 300 mg        300 mg Oral Daily 12/26/20 1023     12/25/20 2000  ceFEPIme (MAXIPIME) 1 g in sodium chloride 0.9 % 100 mL IVPB  Status:  Discontinued        1 g 200 mL/hr over 30 Minutes Intravenous Every 24 hours 12/25/20 1142 12/29/20 1002   12/25/20 1600  dolutegravir (TIVICAY) tablet 50 mg        50 mg Oral Daily-1600 12/25/20 0918     12/25/20 1318  vancomycin variable dose per unstable renal function (pharmacist dosing)  Status:  Discontinued         Does not apply See admin instructions 12/25/20 1319 12/28/20 0827   12/25/20 0845  vancomycin (VANCOCIN) IVPB 1000 mg/200 mL premix  Status:  Discontinued        1,000 mg 200 mL/hr over 60 Minutes Intravenous  Once 12/25/20 0833 12/25/20 0842   12/25/20 0845  ceFEPIme (MAXIPIME) 2 g in sodium chloride 0.9 % 100 mL IVPB  Status:  Discontinued        2 g 200 mL/hr over 30 Minutes Intravenous  Once 12/25/20 0833 12/25/20 0843   12/24/20 1945  zidovudine (RETROVIR) capsule 100 mg  Status:  Discontinued       Note to Pharmacy: (0900, 1300, & 2100)     100 mg Oral 3 times daily 12/24/20 1827 12/26/20 1023   12/24/20 1830  vancomycin (VANCOCIN) IVPB 1000 mg/200 mL premix        1,000 mg 200 mL/hr over 60 Minutes Intravenous  Once 12/24/20 1719 12/24/20 1905   12/24/20 1830  lamivudine (EPIVIR) tablet 50 mg  Status:  Discontinued       Note  to Pharmacy: (0800)     50 mg Oral Daily 12/24/20 1827 12/26/20 1254   12/24/20 1800  ceFEPIme (MAXIPIME) 2 g in sodium chloride 0.9 % 100 mL IVPB        2 g 200 mL/hr over 30 Minutes Intravenous  Once 12/24/20 1718 12/24/20 1800        Subjective: Patient seen and examined at bedside.  Poor historian.  Oral intake is still poor.  No overnight agitation, fever, seizures or vomiting reported.  Objective: Vitals:   01/06/21 0933 01/06/21 1630 01/06/21 2110 01/07/21 0453  BP: (!) 171/76 (!) 178/99 (!) 146/80 (!) 150/81  Pulse: 63 (!) 49 62 (!) 47  Resp: 16 18 18 16   Temp: 98.3 F (36.8 C) 98 F (36.7 C) 100.2 F (37.9 C) (!) 97.5 F (36.4 C)  TempSrc: Oral  Oral Oral  SpO2: 100%  97% 100%  Weight:      Height:        Intake/Output Summary (Last 24 hours) at 01/07/2021 0741 Last data filed at 01/07/2021 0600 Gross per 24 hour  Intake 657 ml  Output 50 ml  Net 607 ml    Filed Weights   01/04/21 0723 01/04/21 1055 01/05/21 0407  Weight: 42.4 kg 41.4 kg 41.9 kg    Examination:  General exam: Currently on room air.  No acute distress.  Looks chronically ill and deconditioned. Extremely thinly built.  Very poor historian.   Respiratory system: Bilateral decreased breath sounds at bases with scattered crackles  cardiovascular system: S1-S2 heard; bradycardic intermittently gastrointestinal system: Abdomen is mildly distended,, soft and nontender.  Normal bowel sounds heard  extremities: No cyanosis; trace lower extremity edema present bilaterally Central nervous system: Still slow to respond.  Answers a few questions.  No focal neurological deficits.  Moving extremities Skin: No other petechiae.  Right upper extremity wound VAC with dressing present  psychiatry: Affect is extremely flat.  Does not participate in communication much.  Data Reviewed: I have personally reviewed following labs and imaging studies  CBC: Recent Labs  Lab 01/03/21 0503 01/04/21 0436  01/05/21 0635 01/06/21 0346 01/07/21 0542  WBC 4.3 4.8 4.3 4.6 4.3  HGB 9.2* 9.8* 9.7* 9.0* 9.1*  HCT 30.3* 32.1* 32.0* 29.5* 30.1*  MCV 100.7* 101.6* 101.3* 102.1* 103.1*  PLT 159 174 174 174 213    Basic Metabolic Panel: Recent Labs  Lab 01/01/21 0059 01/02/21 0349 01/03/21 0503 01/04/21 0436 01/05/21 0635 01/06/21 0346 01/07/21 0542  NA 133* 139 133* 131* 133* 135 136  K 4.8 3.9 3.7 4.2 4.2 4.4 4.8  CL 98 105 99 97* 99 102 99  CO2 22 27 27 27 25 26 28   GLUCOSE 134* 83 79 99 76 80 72  BUN 51* 23 12 21 11 23  34*  CREATININE 9.17* 5.26* 3.89* 5.57* 3.92* 5.46* 7.06*  CALCIUM 8.5* 8.1* 8.8* 9.0 8.8* 8.9 8.9  MG 2.2 2.0 2.1 2.0 2.1  --   --     GFR: Estimated Creatinine Clearance: 5.1 mL/min (A) (by C-G formula based on SCr of 7.06 mg/dL (H)). Liver Function Tests: No results for input(s): AST, ALT, ALKPHOS, BILITOT, PROT, ALBUMIN in the last 168 hours. No results for input(s): LIPASE, AMYLASE in the last 168 hours. No results for input(s): AMMONIA in the last 168 hours. Coagulation Profile: No results for input(s): INR, PROTIME in the last 168 hours. Cardiac Enzymes: No results for input(s): CKTOTAL, CKMB, CKMBINDEX, TROPONINI in the last 168 hours. BNP (last 3 results) No results for input(s): PROBNP in the last 8760 hours. HbA1C: No results for input(s): HGBA1C in the last 72 hours. CBG: Recent Labs  Lab 01/06/21 1113 01/06/21 1630 01/06/21 2113 01/07/21 0122 01/07/21 0639  GLUCAP 109* 81 153* 97 86    Lipid Profile: No results for input(s): CHOL, HDL, LDLCALC, TRIG, CHOLHDL, LDLDIRECT in the last 72 hours. Thyroid Function Tests: Recent Labs    01/06/21 0346  TSH 3.806    Anemia Panel: Recent Labs    01/06/21 0346  VITAMINB12 1,517*   Sepsis Labs: No results for input(s): PROCALCITON, LATICACIDVEN in the last 168 hours.  No results found for this or any previous visit (  from the past 240 hour(s)).        Radiology Studies: No results  found.      Scheduled Meds:  apixaban  2.5 mg Oral BID   calcitRIOL  1.25 mcg Oral Q T,Th,Sa-HD   Chlorhexidine Gluconate Cloth  6 each Topical Q0600   cinacalcet  60 mg Oral Q T,Th,Sa-HD   darbepoetin (ARANESP) injection - DIALYSIS  200 mcg Intravenous Q Thu-HD   dolutegravir  50 mg Oral q1600   fentaNYL  1 patch Transdermal Q25Z   folic acid  1 mg Oral Daily   lamiVUDine  50 mg Oral Daily   methadone  2.5 mg Oral Q2000   metoprolol tartrate  50 mg Oral BID   mirtazapine  15 mg Oral QHS   multivitamin  1 tablet Oral QHS   pantoprazole  40 mg Oral QAC breakfast   vancomycin variable dose per unstable renal function (pharmacist dosing)   Does not apply See admin instructions   venlafaxine XR  150 mg Oral Q breakfast   zidovudine  300 mg Oral Daily   Continuous Infusions:        Aline August, MD [facility] 01/07/2021, 7:41 AM

## 2021-01-07 NOTE — Progress Notes (Signed)
Nutrition Follow-up  DOCUMENTATION CODES:   Severe malnutrition in context of chronic illness  INTERVENTION:  -continue to encourage/recommend snacks BID -continue renal mvi daily  NUTRITION DIAGNOSIS:   Severe Malnutrition related to chronic illness (ESRD on HD) as evidenced by severe fat depletion, severe muscle depletion.  ongoing  GOAL:   Patient will meet greater than or equal to 90% of their needs  progressing  MONITOR:   Diet advancement, PO intake, I & O's, Labs, Weight trends  REASON FOR ASSESSMENT:   Malnutrition Screening Tool    ASSESSMENT:   Patient with PMH significant for chronic diarrhea, ESRD on HD, GERD, HIV, HTN, pulmonary HTN, anxiety/depression, Atrial flutter, and traumatic hematoma of R upper arm s/p recent balloon angioplasty of R subclavian and R innominate veins w/ conversion of R forearm fistula to R upper arm fistula Sept 2022 presented to [facility] ED 12/24/20 c/o progressively worsening R arm pain and was admitted with R AVF infection. Transferred to [facility] for further care and HD treatments.  10/28 ligation of AV fistula and I&D 10/31 tunneled cath placement 11/1 washout and woundVAC placement 11/4 woundVAC changed  Pt out of room at time of RD visit. Pt in OR with Vascular to place skin substitute with plans for HD following the OR. Note Palliative is following -- code status changed to DNR and pt indicated that she would not want a feeding tube.   PO intake remains poor. Last 8 meal completions documented as 0-75% (31% avg meal intake). Staff should continue to encourage PO intake.   Last HD 11/5 w/ 1L net UF  Post HD wt 41.4 kg Current wt 41.9 kg EDW 41.5 kg  No UOP documented x24 hours woundVAC: 49ml output x24 hours I/O: +2L since admit  Medications: rocaltrol, sensipar, aranesp, folvite, remeron, rena-vit, protonix, abx Labs reviewed.  CBGs 70-35-00-93  Diet Order:   Diet Order             Diet renal with fluid restriction  Fluid restriction: 1200 mL Fluid; Room service appropriate? Yes; Fluid consistency: Thin  Diet effective now                   EDUCATION NEEDS:   No education needs have been identified at this time  Skin:  Skin Assessment: Skin Integrity Issues: Skin Integrity Issues:: Incisions Incisions: R arm  Last BM:  11/7  Height:   Ht Readings from Last 1 Encounters:  01/07/21 5' 2.5" (1.588 m)    Weight:   Wt Readings from Last 1 Encounters:  01/07/21 48.9 kg    BMI:  Body mass index is 19.4 kg/m.  Estimated Nutritional Needs:   Kcal:  1450-1650  Protein:  70-85 grams  Fluid:  1L+UOP    Larkin Ina, MS, RD, LDN (she/her/hers) RD pager number and weekend/on-call pager number located in [place].

## 2021-01-07 NOTE — Progress Notes (Signed)
This chaplain responded to PMT consult for spiritual care. The Pt. is awake and sharing smiles throughout the visit.  The chaplain understands through reflective listening the Pt. has found a peaceful place moving into her procedure and dialysis later today.  The Pt. remains hopeful her persistence in PT/OT and faithfulness will allow her to continue to visit and communicate with family and friends. The chaplain understands having the opportunity to talk to her daughter-[name] and Claudette Hall-[name]'s aunt holds an important place in her story. The Pt. is interested in acquiring a bracelet to inform others of her healthcare needs.  The Pt. and chaplain shared prayer and scripture reading before the procedure. This chaplain is available for F/U spiritual care as needed.  Chaplain Sallyanne Kuster [phone]

## 2021-01-07 NOTE — Progress Notes (Signed)
Pt off unit to hemo

## 2021-01-07 NOTE — Anesthesia Preprocedure Evaluation (Addendum)
Anesthesia Evaluation  Patient identified by MRN, date of birth, ID band Patient awake    Reviewed: Allergy & Precautions, NPO status , Patient's Chart, lab work & pertinent test results, reviewed documented beta blocker date and time   History of Anesthesia Complications Negative for: history of anesthetic complications  Airway Mallampati: III  TM Distance: >3 FB Neck ROM: Full  Mouth opening: Limited Mouth Opening  Dental  (+) Edentulous Upper, Edentulous Lower, Dental Advisory Given   Pulmonary neg pulmonary ROS, former smoker,    breath sounds clear to auscultation       Cardiovascular hypertension, Pt. on medications and Pt. on home beta blockers +CHF   Rhythm:Regular  1. Left ventricular ejection fraction, by estimation, is 60 to 65%. Left  ventricular ejection fraction by 3D volume is 64 %. The left ventricle has  normal function. The left ventricle has no regional wall motion  abnormalities. Left ventricular diastolic  function could not be evaluated.  2. Right ventricular systolic function is normal. The right ventricular  size is normal. Tricuspid regurgitation signal is inadequate for assessing  PA pressure.  3. Left atrial size was mildly dilated.  4. The mitral valve is normal in structure. Trivial mitral valve  regurgitation. No evidence of mitral stenosis.  5. The aortic valve is tricuspid. Aortic valve regurgitation is not  visualized. Mild to moderate aortic valve sclerosis/calcification is  present, without any evidence of aortic stenosis.  6. The inferior vena cava is normal in size with greater than 50%  respiratory variability, suggesting right atrial pressure of 3 mmHg.    Neuro/Psych  Headaches, PSYCHIATRIC DISORDERS Anxiety Depression TIA   GI/Hepatic Neg liver ROS, PUD, GERD  ,  Endo/Other  negative endocrine ROS  Renal/GU ESRF and DialysisRenal disease.lasthd sat  Lab Results       Component                Value               Date                      CREATININE               7.06 (H)            01/07/2021           Lab Results      Component                Value               Date                      K                        4.8                 01/07/2021                Musculoskeletal negative musculoskeletal ROS (+)   Abdominal   Peds  Hematology  (+) Blood dyscrasia, anemia , Lab Results      Component                Value               Date  WBC                      4.3                 01/07/2021                HGB                      9.1 (L)             01/07/2021                HCT                      30.1 (L)            01/07/2021                MCV                      103.1 (H)           01/07/2021                PLT                      192                 01/07/2021              Anesthesia Other Findings   Reproductive/Obstetrics                             Anesthesia Physical Anesthesia Plan  ASA: 4  Anesthesia Plan: General   Post-op Pain Management:    Induction: Intravenous  PONV Risk Score and Plan: 3 and Ondansetron and Dexamethasone  Airway Management Planned: LMA  Additional Equipment: None  Intra-op Plan:   Post-operative Plan: Extubation in OR  Informed Consent: I have reviewed the patients History and Physical, chart, labs and discussed the procedure including the risks, benefits and alternatives for the proposed anesthesia with the patient or authorized representative who has indicated his/her understanding and acceptance.   Patient has DNR.  Discussed DNR with patient and Continue DNR.   Dental advisory given  Plan Discussed with: CRNA and Anesthesiologist  Anesthesia Plan Comments:        Anesthesia Quick Evaluation

## 2021-01-07 NOTE — Progress Notes (Signed)
Occupational Therapy Treatment Patient Details Name: [name] MRN: [id number] DOB: 10-13-1950 Today's Date: 01/07/2021   History of present illness 70 y.o. female presents to [facility] hospital on 12/24/2020 with RUE fistula infection. Pt underwent temporary HDU catheter placement on 12/26/2020. S/p ligation and debridement 12/27/2020. PMH includes ESRD on HDU TTS, HIV, a flutter.   OT comments  Patient received supine in bed with complaints of nausea but willing to participate in RUE HEP. Patient tolerated AAROM to RUE shoulder for flexion and stretching to RUE for finger flexion to prepare for HEP with good finger flexion but limited in index finger. Patient performed exercises from HEP with min verbal cues to perform correctly.  Acute OT to continue to follow.    Recommendations for follow up therapy are one component of a multi-disciplinary discharge planning process, led by the attending physician.  Recommendations may be updated based on patient status, additional functional criteria and insurance authorization.    Follow Up Recommendations  Long-term institutional care without follow-up therapy    Assistance Recommended at Discharge Frequent or constant Supervision/Assistance  Equipment Recommendations  None recommended by OT    Recommendations for Other Services      Precautions / Restrictions Precautions Precautions: Fall Precaution Comments: R arm wound vac Restrictions Weight Bearing Restrictions: No       Mobility Bed Mobility                    Transfers                         Balance                                           ADL either performed or assessed with clinical judgement   ADL                                              Extremity/Trunk Assessment Upper Extremity Assessment Upper Extremity Assessment: Defer to OT evaluation            Vision       Perception     Praxis       Cognition Arousal/Alertness: Awake/alert Behavior During Therapy: WFL for tasks assessed/performed Overall Cognitive Status: No family/caregiver present to determine baseline cognitive functioning                                 General Comments: followed commands well, states she has been performing HEP          Exercises Exercises: Other exercises;Hand exercises General Exercises - Upper Extremity Shoulder Flexion: AAROM;Right;10 reps;Seated;PROM Hand Exercises Forearm Supination: AROM;AAROM;Right;10 reps;Supine Wrist Flexion: AROM;PROM;Right;10 reps;Supine Wrist Extension: AROM;Self ROM;10 reps;Supine Digit Composite Flexion: AROM;10 reps;Right;Supine   Shoulder Instructions       General Comments      Pertinent Vitals/ Pain       Pain Assessment: Faces Faces Pain Scale: Hurts little more Pain Location: RUE Pain Descriptors / Indicators: Aching;Burning;Sore Pain Intervention(s): Patient requesting pain meds-RN notified  Home Living  Prior Functioning/Environment              Frequency  Min 2X/week        Progress Toward Goals  OT Goals(current goals can now be found in the care plan section)  Progress towards OT goals: Progressing toward goals  Acute Rehab OT Goals Patient Stated Goal: right arm to get better OT Goal Formulation: With patient Time For Goal Achievement: 01/17/21 Potential to Achieve Goals: Good ADL Goals Pt Will Perform Eating: with set-up;sitting Pt Will Perform Grooming: sitting;with supervision Pt Will Perform Upper Body Bathing: with min assist;sitting Pt Will Perform Upper Body Dressing: with min assist;sitting Pt Will Transfer to Toilet: with mod assist;squat pivot transfer;bedside commode Pt Will Perform Toileting - Clothing Manipulation and hygiene: with mod assist;sitting/lateral leans Pt/caregiver will Perform Home Exercise Program: Increased  ROM;Increased strength;Right Upper extremity;With written HEP provided;With Supervision Additional ADL Goal #1: Pt will be independent with positioning Rt UE  Plan Discharge plan remains appropriate    Co-evaluation                 AM-PAC OT "6 Clicks" Daily Activity     Outcome Measure   Help from another person eating meals?: A Little Help from another person taking care of personal grooming?: A Little Help from another person toileting, which includes using toliet, bedpan, or urinal?: Total Help from another person bathing (including washing, rinsing, drying)?: Total Help from another person to put on and taking off regular upper body clothing?: Total Help from another person to put on and taking off regular lower body clothing?: Total 6 Click Score: 10    End of Session    OT Visit Diagnosis: Muscle weakness (generalized) (M62.81);Pain Pain - Right/Left: Right Pain - part of body: Arm   Activity Tolerance Patient tolerated treatment well   Patient Left in bed;with call bell/phone within reach;with bed alarm set   Nurse Communication Patient requests pain meds        Time: 8250-0370 OT Time Calculation (min): 25 min  Charges: OT General Charges $OT Visit: 1 Visit OT Treatments $Therapeutic Exercise: 23-37 mins  Lodema Hong, [place]  Pager [phone] Office [place] 01/07/2021, 8:32 AM

## 2021-01-07 NOTE — Progress Notes (Signed)
Pt given bolus of NS d/t not feeling well. Pt UF off. RN notified will continue to monitor.

## 2021-01-07 NOTE — Progress Notes (Signed)
Nephrology Progress Note:   Patient ID: [name], female   DOB: [birth year], 70 y.o.   MRN: [id number]  S: last HD on 11/5 with 1 kg UF. Per vascular note plans are for OR today to place skin substitute.  She hasn't had procedure yet and is talking with chaplain at bedside.   Review of systems:  Denies shortness of breath or chest pain Had nausea this am but now resolved; happens sometime outpt, too    O:BP (!) 158/94 (BP Location: Left Leg)   Pulse (!) 50   Temp (!) 97.5 F (36.4 C) (Oral)   Resp 17   Ht 5' 2.5" (1.588 m)   Wt 41.9 kg   LMP  (LMP Unknown)   SpO2 100%   BMI 16.63 kg/m   Intake/Output Summary (Last 24 hours) at 01/07/2021 0930 Last data filed at 01/07/2021 0800 Gross per 24 hour  Intake 417 ml  Output 50 ml  Net 367 ml   Intake/Output: I/O last 3 completed shifts: In: 657 [P.O.:657] Out: 50 [Drains:50]  Intake/Output this shift:  No intake/output data recorded. Weight change:    Physical exam:  General adult female in bed in no acute distress HEENT normocephalic atraumatic extraocular movements intact sclera anicteric Neck supple trachea midline Lungs clear to auscultation bilaterally normal work of breathing at rest  Heart S1S2 no rub Abdomen soft nontender nondistended Extremities no edema RUE with wound VAC in place Psych normal mood and affect Access: left IJ tunn catheter in place  Recent Labs  Lab 01/01/21 0059 01/02/21 0349 01/03/21 0503 01/04/21 0436 01/05/21 0635 01/06/21 0346 01/07/21 0542  NA 133* 139 133* 131* 133* 135 136  K 4.8 3.9 3.7 4.2 4.2 4.4 4.8  CL 98 105 99 97* 99 102 99  CO2 22 27 27 27 25 26 28   GLUCOSE 134* 83 79 99 76 80 72  BUN 51* 23 12 21 11 23  34*  CREATININE 9.17* 5.26* 3.89* 5.57* 3.92* 5.46* 7.06*  CALCIUM 8.5* 8.1* 8.8* 9.0 8.8* 8.9 8.9    CBC: Recent Labs  Lab 01/03/21 0503 01/04/21 0436 01/05/21 0635 01/06/21 0346 01/07/21 0542  WBC 4.3 4.8 4.3 4.6 4.3  HGB 9.2* 9.8* 9.7* 9.0* 9.1*   HCT 30.3* 32.1* 32.0* 29.5* 30.1*  MCV 100.7* 101.6* 101.3* 102.1* 103.1*  PLT 159 174 174 174 192   CBG: Recent Labs  Lab 01/06/21 1630 01/06/21 2113 01/07/21 0122 01/07/21 0639 01/07/21 0821  GLUCAP 81 153* 97 86 75    Iron Studies: No results for input(s): IRON, TIBC, TRANSFERRIN, FERRITIN in the last 72 hours. Studies/Results: No results found.  apixaban  2.5 mg Oral BID   calcitRIOL  1.25 mcg Oral Q T,Th,Sa-HD   Chlorhexidine Gluconate Cloth  6 each Topical Q0600   cinacalcet  60 mg Oral Q T,Th,Sa-HD   darbepoetin (ARANESP) injection - DIALYSIS  200 mcg Intravenous Q Thu-HD   dolutegravir  50 mg Oral q1600   fentaNYL  1 patch Transdermal E95M   folic acid  1 mg Oral Daily   lamiVUDine  50 mg Oral Daily   methadone  2.5 mg Oral Q2000   metoprolol tartrate  50 mg Oral BID   mirtazapine  15 mg Oral QHS   multivitamin  1 tablet Oral QHS   pantoprazole  40 mg Oral QAC breakfast   vancomycin variable dose per unstable renal function (pharmacist dosing)   Does not apply See admin instructions   venlafaxine XR  150 mg  Oral Q breakfast   zidovudine  300 mg Oral Daily    BMET    Component Value Date/Time   NA 136 01/07/2021 0542   K 4.8 01/07/2021 0542   CL 99 01/07/2021 0542   CO2 28 01/07/2021 0542   GLUCOSE 72 01/07/2021 0542   BUN 34 (H) 01/07/2021 0542   CREATININE 7.06 (H) 01/07/2021 0542   CREATININE 5.66 (H) 12/22/2019 1117   CALCIUM 8.9 01/07/2021 0542   CALCIUM 9.0 03/12/2011 0527   GFRNONAA 6 (L) 01/07/2021 0542   GFRNONAA 5 (L) 02/12/2015 1000   GFRAA 9 (L) 10/22/2019 0105   GFRAA 5 (L) 02/12/2015 1000   CBC    Component Value Date/Time   WBC 4.3 01/07/2021 0542   RBC 2.92 (L) 01/07/2021 0542   HGB 9.1 (L) 01/07/2021 0542   HCT 30.1 (L) 01/07/2021 0542   PLT 192 01/07/2021 0542   MCV 103.1 (H) 01/07/2021 0542   MCH 31.2 01/07/2021 0542   MCHC 30.2 01/07/2021 0542   RDW 18.9 (H) 01/07/2021 0542   LYMPHSABS 1.1 12/25/2020 0409   MONOABS 0.7  12/25/2020 0409   EOSABS 0.1 12/25/2020 0409   BASOSABS 0.0 12/25/2020 0409    Dialysis Orders: Center: [facility]  on TTS . EDW 41.5kg HD Bath 2K/2.5Ca  Time 3 hours Heparin 1000 units bolus then 500 units/hr. 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  appreciate vascular surgery evaluation-  s/p ligation and debridement 10/28.  Now with wound VAC in place.  On vanc until December 14. MRSA on culture.  We have communicated to her dialysis unit that she will need vancomycin until December 14  ESRD -  Continue HD per her usual TTS schedule.  We have contacted her outpatient dialysis unit regarding antibiotics as above.  Noted plans for OR today - we will plan for HD in the afternoon after she goes to the OR  Hypertension/volume  - UF as able.  She has prespecified max of 3.5 liters.  Home med metoprolol note also arrhythmia   Anemia  - Continue aranesp - is ordered 200 mcg weekly   Metabolic bone disease -  continue with calcitriol and sensipar AFib/FL:  improved with increased metoprolol dose HIV - regimen per primary team   Disposition per primary team - note vascular plans for OR on 11/8  Claudia Desanctis, MD 01/07/2021 9:30 AM

## 2021-01-07 NOTE — Op Note (Signed)
    Patient name: [name] MRN: [id number] DOB: [birth year] Sex: female  01/07/2021 Pre-operative Diagnosis: Right arm wound, end-stage renal disease, need for continuous anticoagulation Post-operative diagnosis:  Same Surgeon:  Eda Paschal. Donzetta Matters, MD Assistant: Leontine Locket, PA Procedure Performed: 1.  Application of skin substitute to right upper extremity total 22.5 cm using myriad matrix with 500 mg of morsels and 7 x 10 cm 3 layer 2.  Application of negative pressure dressing  Indications: 70 year old female with end-stage renal disease previously had a fistula in her right upper extremity which had a necrotic area and was removed.  This was converted to an upper arm fistula but unfortunately she had necrosis of her wound.  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  The fistula has now been ligated and she 2 areas of open wound on the right arm 1 with exposed fistula stump.  She is now indicated for washout and placement of skin substitute.  An assistant was necessary to expedite the case.  Findings: The tissue was all clean.  There is the beginnings of granulation tissue in both wounds.  At completion we had placed skin substitute at both wounds total 22.5 cm   Procedure:  The patient was identified in the holding area and taken to the operating where she is placed supine operative table and LMA anesthesia was induced.  She was gently prepped draped in the right upper extremity usual fashion, antibiotics were ministered timeout was called.  Both wounds were washed out.  We placed 500 mg of morsels.  We then trimmed 2 separate areas of 3 layer intact this to the skin with 4-0 Monocryl and several areas.  Sorebact dressing was then placed intact of the skin with 4-0 nylon sutures.  Wound vacs were fashioned at both wounds and placed to suction.  She tolerated procedure well without immediate complication.  All counts  were correct at completion.  EBL: 20cc  Delvina Mizzell C. Donzetta Matters, MD [facility] [place] Office: [phone] Pager: [phone]

## 2021-01-07 NOTE — Transfer of Care (Signed)
Immediate Anesthesia Transfer of Care Note  Patient: SOLIANA KITKO  Procedure(s) Performed: RIGHT ARM INCISION AND DRAINAGE (Right) MYRIAD SKIN GRAFT PLACEMENT (Right) APPLICATION OF WOUND VAC (Right)  Patient Location: PACU  Anesthesia Type:General  Level of Consciousness: awake, drowsy and patient cooperative  Airway & Oxygen Therapy: Patient Spontanous Breathing  Post-op Assessment: Report given to RN and Post -op Vital signs reviewed and stable  Post vital signs: Reviewed and stable  Last Vitals:  Vitals Value Taken Time  BP 181/94 01/07/21 1327  Temp    Pulse 58 01/07/21 1328  Resp 17 01/07/21 1328  SpO2 95 % 01/07/21 1328  Vitals shown include unvalidated device data.  Last Pain:  Vitals:   01/07/21 1011  TempSrc: Oral  PainSc:       Patients Stated Pain Goal: 1 (66/66/48 6161)  Complications: No notable events documented.

## 2021-01-07 NOTE — Progress Notes (Signed)
  Progress Note    01/07/2021 11:16 AM Day of Surgery  Subjective: No overnight issues  Vitals:   01/07/21 0818 01/07/21 1011  BP: (!) 158/94 (!) 169/90  Pulse: (!) 50 69  Resp: 17 18  Temp:  98.1 F (36.7 C)  SpO2: 100% 97%    Physical Exam: Awake and alert Nonlabored respirations Right arm vac with suction  CBC    Component Value Date/Time   WBC 4.3 01/07/2021 0542   RBC 2.92 (L) 01/07/2021 0542   HGB 9.1 (L) 01/07/2021 0542   HCT 30.1 (L) 01/07/2021 0542   PLT 192 01/07/2021 0542   MCV 103.1 (H) 01/07/2021 0542   MCH 31.2 01/07/2021 0542   MCHC 30.2 01/07/2021 0542   RDW 18.9 (H) 01/07/2021 0542   LYMPHSABS 1.1 12/25/2020 0409   MONOABS 0.7 12/25/2020 0409   EOSABS 0.1 12/25/2020 0409   BASOSABS 0.0 12/25/2020 0409    BMET    Component Value Date/Time   NA 136 01/07/2021 0542   K 4.8 01/07/2021 0542   CL 99 01/07/2021 0542   CO2 28 01/07/2021 0542   GLUCOSE 72 01/07/2021 0542   BUN 34 (H) 01/07/2021 0542   CREATININE 7.06 (H) 01/07/2021 0542   CREATININE 5.66 (H) 12/22/2019 1117   CALCIUM 8.9 01/07/2021 0542   CALCIUM 9.0 03/12/2011 0527   GFRNONAA 6 (L) 01/07/2021 0542   GFRNONAA 5 (L) 02/12/2015 1000   GFRAA 9 (L) 10/22/2019 0105   GFRAA 5 (L) 02/12/2015 1000    INR    Component Value Date/Time   INR 1.14 12/28/2011 1403     Intake/Output Summary (Last 24 hours) at 01/07/2021 1116 Last data filed at 01/07/2021 0800 Gross per 24 hour  Intake 417 ml  Output 50 ml  Net 367 ml     Assessment/plan:  70 y.o. female is here with necrotic fistula that is now ligated there is a stump of fistula exposed.  Plan will be for placement of skin substitute over the fistula and wound VAC to hopefully get granulation tissue.    Yanisa Goodgame C. Donzetta Matters, MD [facility] [place] Office: [phone] Pager: [phone]  01/07/2021 11:16 AM

## 2021-01-07 NOTE — Anesthesia Procedure Notes (Signed)
Procedure Name: LMA Insertion Date/Time: 01/07/2021 12:16 PM Performed by: Dorthea Cove, CRNA Pre-anesthesia Checklist: Patient identified, Emergency Drugs available, Suction available and Patient being monitored Patient Re-evaluated:Patient Re-evaluated prior to induction Oxygen Delivery Method: Circle System Utilized Preoxygenation: Pre-oxygenation with 100% oxygen Induction Type: IV induction Ventilation: Mask ventilation without difficulty LMA: LMA inserted LMA Size: 4.0 Number of attempts: 1 Airway Equipment and Method: Bite block Placement Confirmation: positive ETCO2 Tube secured with: Tape Dental Injury: Teeth and Oropharynx as per pre-operative assessment

## 2021-01-08 ENCOUNTER — Ambulatory Visit: Payer: Medicare Other

## 2021-01-08 ENCOUNTER — Encounter (HOSPITAL_COMMUNITY): Payer: Self-pay | Admitting: Vascular Surgery

## 2021-01-08 DIAGNOSIS — I10 Essential (primary) hypertension: Secondary | ICD-10-CM | POA: Diagnosis not present

## 2021-01-08 DIAGNOSIS — I4892 Unspecified atrial flutter: Secondary | ICD-10-CM | POA: Diagnosis not present

## 2021-01-08 DIAGNOSIS — T829XXA Unspecified complication of cardiac and vascular prosthetic device, implant and graft, initial encounter: Secondary | ICD-10-CM | POA: Diagnosis not present

## 2021-01-08 DIAGNOSIS — D638 Anemia in other chronic diseases classified elsewhere: Secondary | ICD-10-CM | POA: Diagnosis not present

## 2021-01-08 LAB — GLUCOSE, CAPILLARY
Glucose-Capillary: 115 mg/dL — ABNORMAL HIGH (ref 70–99)
Glucose-Capillary: 67 mg/dL — ABNORMAL LOW (ref 70–99)
Glucose-Capillary: 77 mg/dL (ref 70–99)
Glucose-Capillary: 87 mg/dL (ref 70–99)
Glucose-Capillary: 89 mg/dL (ref 70–99)
Glucose-Capillary: 98 mg/dL (ref 70–99)

## 2021-01-08 LAB — CBC
HCT: 30.5 % — ABNORMAL LOW (ref 36.0–46.0)
Hemoglobin: 9.1 g/dL — ABNORMAL LOW (ref 12.0–15.0)
MCH: 31.3 pg (ref 26.0–34.0)
MCHC: 29.8 g/dL — ABNORMAL LOW (ref 30.0–36.0)
MCV: 104.8 fL — ABNORMAL HIGH (ref 80.0–100.0)
Platelets: 190 10*3/uL (ref 150–400)
RBC: 2.91 MIL/uL — ABNORMAL LOW (ref 3.87–5.11)
RDW: 19.8 % — ABNORMAL HIGH (ref 11.5–15.5)
WBC: 4.6 10*3/uL (ref 4.0–10.5)
nRBC: 0 % (ref 0.0–0.2)

## 2021-01-08 LAB — VANCOMYCIN, RANDOM: Vancomycin Rm: 22

## 2021-01-08 LAB — HEPATITIS B SURFACE ANTIBODY, QUANTITATIVE: Hep B S AB Quant (Post): >1000 m[IU]/mL (ref >9.9)

## 2021-01-08 MED ORDER — CHLORHEXIDINE GLUCONATE CLOTH 2 % EX PADS: 6.0000 | MEDICATED_PAD | Freq: Every day | CUTANEOUS | Status: DC

## 2021-01-08 MED ORDER — VANCOMYCIN HCL IN DEXTROSE 500-5 MG/100ML-% IV SOLN
500.0000 mg | INTRAVENOUS | Status: DC
  Administered 2021-01-09: 500 mg via INTRAVENOUS
  Filled 2021-01-08 (×2): qty 100

## 2021-01-08 NOTE — Progress Notes (Signed)
[facility]  PROGRESS NOTE  [name] ZOX:[id number] DOB: 04-19-1950 DOA: 12/24/2020 PCP: Hilbert Corrigan, MD   Brief HPI:   71 year old female with a history of ESRD on HD TTS, HIV, atrial flutter on Eliquis, AV fistula occlusion 4 weeks ago requiring balloon angioplasty and conversion of right forearm fistula to right upper extremity fistula presented with right upper extremity fistula infection and was transferred from [place].  Vascular surgery, nephrology, IR and ID were consulted.  She underwent ligation of AV fistula and incision and drainage on 12/27/2020.  She had tunneled catheter placement on 12/30/2020.  She returned to the OR on 12/31/2020 by vascular surgery for washout and wound VAC placement.  Wound VAC was changed on 01/03/2021.  She underwent placement of skin substitute as per vascular surgery on 01/07/2021.    Subjective   Patient seen and examined, denies any complaints.   Assessment/Plan:   Right AV fistula infection -S/p incision and drainage, ligation of AV fistula on 10/28 S/p OR on 12/31/2020 for washout and wound VAC placement -Underwent skin s substitute placement on 01/07/2021 -ID recommends IV vancomycin with dialysis for total 6 weeks, last day 02/12/2021 -Wound VAC as per vascular surgery -Vascular surgery will follow up as outpatient  ESRD on hemodialysis -Nephrology following  Atrial flutter -Continue metoprolol -Continue apixaban for anticoagulation  HIV -CD4 252 -Continue antiretroviral therapy  Anemia of chronic disease -Hemoglobin is stable  History of chronic pain syndrome -On fentanyl patch, methadone -Outpatient follow-up with pain management  Severe protein calorie malnutrition -Encourage oral intake  Patient need to go to [facility] for rehab         Scheduled medications:    apixaban  2.5 mg Oral BID   calcitRIOL  1.25 mcg Oral Q T,Th,Sa-HD   Chlorhexidine Gluconate Cloth  6 each Topical Q0600    [START ON 01/09/2021] Chlorhexidine Gluconate Cloth  6 each Topical Q0600   cinacalcet  60 mg Oral Q T,Th,Sa-HD   darbepoetin (ARANESP) injection - DIALYSIS  200 mcg Intravenous Q Thu-HD   dolutegravir  50 mg Oral q1600   fentaNYL  1 patch Transdermal W11B   folic acid  1 mg Oral Daily   lamiVUDine  50 mg Oral Daily   methadone  2.5 mg Oral Q2000   metoprolol tartrate  50 mg Oral BID   mirtazapine  15 mg Oral QHS   multivitamin  1 tablet Oral QHS   pantoprazole  40 mg Oral QAC breakfast   [START ON 01/09/2021] vancomycin  500 mg Intravenous Q T,Th,Sa-HD   venlafaxine XR  150 mg Oral Q breakfast   zidovudine  300 mg Oral Daily     Data Reviewed:   CBG:  Recent Labs  Lab 01/08/21 0004 01/08/21 0434 01/08/21 0650 01/08/21 1143 01/08/21 1707  GLUCAP 87 67* 89 115* 77    SpO2: 98 % O2 Flow Rate (L/min): 2 L/min    Vitals:   01/07/21 2016 01/08/21 0430 01/08/21 0913 01/08/21 1730  BP: (!) 168/89 (!) 157/87 (!) 153/75 (!) 160/79  Pulse: 68 (!) 55 (!) 57 64  Resp: 18 18 18 18   Temp: 98.4 F (36.9 C) 98.4 F (36.9 C)  98.7 F (37.1 C)  TempSrc: Oral Oral    SpO2: 100% 99% 100% 98%  Weight: 51.5 kg     Height:         Intake/Output Summary (Last 24 hours) at 01/08/2021 1905 Last data filed at 01/08/2021 1817 Gross per 24 hour  Intake 360 ml  Output 0 ml  Net 360 ml    11/08 0701 - 11/09 1900 In: 760 [P.O.:360; I.V.:400] Out: 2030   [facility] Weights   01/07/21 1018 01/07/21 1454 01/07/21 2016  Weight: 41.9 kg 48.9 kg 51.5 kg    Data Reviewed: Basic Metabolic Panel: Recent Labs  Lab 01/02/21 0349 01/03/21 0503 01/04/21 0436 01/05/21 0635 01/06/21 0346 01/07/21 0542  NA 139 133* 131* 133* 135 136  K 3.9 3.7 4.2 4.2 4.4 4.8  CL 105 99 97* 99 102 99  CO2 27 27 27 25 26 28   GLUCOSE 83 79 99 76 80 72  BUN 23 12 21 11 23  34*  CREATININE 5.26* 3.89* 5.57* 3.92* 5.46* 7.06*  CALCIUM 8.1* 8.8* 9.0 8.8* 8.9 8.9  MG 2.0 2.1 2.0 2.1  --   --    Liver  Function Tests: No results for input(s): AST, ALT, ALKPHOS, BILITOT, PROT, ALBUMIN in the last 168 hours. No results for input(s): LIPASE, AMYLASE in the last 168 hours. No results for input(s): AMMONIA in the last 168 hours. CBC: Recent Labs  Lab 01/04/21 0436 01/05/21 0635 01/06/21 0346 01/07/21 0542 01/08/21 0440  WBC 4.8 4.3 4.6 4.3 4.6  HGB 9.8* 9.7* 9.0* 9.1* 9.1*  HCT 32.1* 32.0* 29.5* 30.1* 30.5*  MCV 101.6* 101.3* 102.1* 103.1* 104.8*  PLT 174 174 174 192 190   Cardiac Enzymes: No results for input(s): CKTOTAL, CKMB, CKMBINDEX, TROPONINI in the last 168 hours. BNP (last 3 results) No results for input(s): BNP in the last 8760 hours.  ProBNP (last 3 results) No results for input(s): PROBNP in the last 8760 hours.  CBG: Recent Labs  Lab 01/08/21 0004 01/08/21 0434 01/08/21 0650 01/08/21 1143 01/08/21 1707  GLUCAP 87 67* 89 115* 77       Radiology Reports  No results found.     Antibiotics: Anti-infectives (From admission, onward)    Start     Dose/Rate Route Frequency Ordered Stop   01/09/21 1200  vancomycin (VANCOCIN) IVPB 500 mg/100 ml premix        500 mg 100 mL/hr over 60 Minutes Intravenous Every T-Th-Sa (Hemodialysis) 01/08/21 0821     01/04/21 1200  vancomycin (VANCOREADY) IVPB 500 mg/100 mL  Status:  Discontinued        500 mg 100 mL/hr over 60 Minutes Intravenous Once in dialysis 01/04/21 0915 01/05/21 0759   12/31/20 1200  vancomycin (VANCOCIN) IVPB 500 mg/100 ml premix  Status:  Discontinued        500 mg 100 mL/hr over 60 Minutes Intravenous Every T-Th-Sa (Hemodialysis) 12/28/20 0827 12/30/20 1234   12/31/20 0955  vancomycin variable dose per unstable renal function (pharmacist dosing)  Status:  Discontinued         Does not apply See admin instructions 12/31/20 0955 01/08/21 0825   12/30/20 1128  vancomycin (VANCOCIN) IVPB 1000 mg/200 mL premix        over 60 Minutes Intravenous Continuous PRN 12/30/20 1148 12/30/20 1128   12/30/20  1028  vancomycin (VANCOCIN) 1-5 GM/200ML-% IVPB       Note to Pharmacy: [facility]   : cabinet override      12/30/20 1028 12/30/20 2244   12/30/20 0000  vancomycin (VANCOCIN) IVPB 1000 mg/200 mL premix        1,000 mg 200 mL/hr over 60 Minutes Intravenous To Radiology 12/29/20 1348 12/30/20 0118   12/27/20 1347  vancomycin (VANCOCIN) 1-5 GM/200ML-% IVPB       Note  to Pharmacy: [facility], Alvis Lemmings   : cabinet override      12/27/20 1347 12/27/20 1420   12/27/20 1345  vancomycin (VANCOCIN) IVPB 1000 mg/200 mL premix        1,000 mg 200 mL/hr over 60 Minutes Intravenous To Surgery 12/27/20 1335 12/27/20 1436   12/27/20 0600  ceFAZolin (ANCEF) IVPB 2g/100 mL premix  Status:  Discontinued        2 g 200 mL/hr over 30 Minutes Intravenous To Radiology 12/26/20 1016 12/26/20 1055   12/26/20 1630  vancomycin (VANCOCIN) IVPB 500 mg/100 ml premix        500 mg 100 mL/hr over 60 Minutes Intravenous  Once 12/26/20 1455 12/26/20 1839   12/26/20 1345  lamiVUDine (EPIVIR) 10 MG/ML solution 50 mg        50 mg Oral Daily 12/26/20 1254     12/26/20 1115  zidovudine (RETROVIR) capsule 300 mg        300 mg Oral Daily 12/26/20 1023     12/25/20 2000  ceFEPIme (MAXIPIME) 1 g in sodium chloride 0.9 % 100 mL IVPB  Status:  Discontinued        1 g 200 mL/hr over 30 Minutes Intravenous Every 24 hours 12/25/20 1142 12/29/20 1002   12/25/20 1600  dolutegravir (TIVICAY) tablet 50 mg        50 mg Oral Daily-1600 12/25/20 0918     12/25/20 1318  vancomycin variable dose per unstable renal function (pharmacist dosing)  Status:  Discontinued         Does not apply See admin instructions 12/25/20 1319 12/28/20 0827   12/25/20 0845  vancomycin (VANCOCIN) IVPB 1000 mg/200 mL premix  Status:  Discontinued        1,000 mg 200 mL/hr over 60 Minutes Intravenous  Once 12/25/20 0833 12/25/20 0842   12/25/20 0845  ceFEPIme (MAXIPIME) 2 g in sodium chloride 0.9 % 100 mL IVPB  Status:  Discontinued        2 g 200 mL/hr over 30  Minutes Intravenous  Once 12/25/20 8099 12/25/20 0843   12/24/20 1945  zidovudine (RETROVIR) capsule 100 mg  Status:  Discontinued       Note to Pharmacy: (0900, 1300, & 2100)     100 mg Oral 3 times daily 12/24/20 1827 12/26/20 1023   12/24/20 1830  vancomycin (VANCOCIN) IVPB 1000 mg/200 mL premix        1,000 mg 200 mL/hr over 60 Minutes Intravenous  Once 12/24/20 1719 12/24/20 1905   12/24/20 1830  lamivudine (EPIVIR) tablet 50 mg  Status:  Discontinued       Note to Pharmacy: (0800)     50 mg Oral Daily 12/24/20 1827 12/26/20 1254   12/24/20 1800  ceFEPIme (MAXIPIME) 2 g in sodium chloride 0.9 % 100 mL IVPB        2 g 200 mL/hr over 30 Minutes Intravenous  Once 12/24/20 1718 12/24/20 1800         DVT prophylaxis: Eliquis  Code Status: Full code  Family Communication: No family at bedside   Consultants: Vascular surgery Nephrology IR Infectious disease Palliative care  Procedures:     Objective    Physical Examination:   General-appears in no acute distress Heart-S1-S2, regular, no murmur auscultated Lungs-clear to auscultation bilaterally, no wheezing or crackles auscultated Abdomen-soft, nontender, no organomegaly Extremities-no edema in the lower extremities Neuro-alert, oriented x3, no focal deficit noted  Status is: Inpatient  Dispo: The patient is from: Home  Anticipated d/c is to: Home              Anticipated d/c date is: 01/09/2021              Patient currently not stable for discharge  Barrier to discharge-as per vascular surgery  COVID-19 Labs  No results for input(s): DDIMER, FERRITIN, LDH, CRP in the last 72 hours.  Lab Results  Component Value Date   [place] NEGATIVE 12/24/2020   [place] NEGATIVE 11/26/2020   [place] NEGATIVE 11/15/2020   [place] NEGATIVE 11/12/2020            No results found for this or any previous visit (from the past 240 hour(s)).  Oswald Hillock   [facility] If 7PM-7AM, please contact night-coverage at [email or web address], Office  [phone]   01/08/2021, 7:05 PM  LOS: 15 days

## 2021-01-08 NOTE — Progress Notes (Signed)
ANTIBIOTIC CONSULT NOTE  Pharmacy Consult for vanc Indication: cath infection  Allergies  Allergen Reactions   Lactose Intolerance (Gi) Other (See Comments)    On MAR   Latex Rash and Itching   Penicillins Rash and Swelling    Has patient had a PCN reaction causing immediate rash, facial/tongue/throat swelling, SOB or lightheadedness with hypotension: No Has patient had a PCN reaction causing severe rash involving mucus membranes or skin necrosis: No Has patient had a PCN reaction that required hospitalization No Has patient had a PCN reaction occurring within the last 10 years: No If all of the above answers are "NO", then may proceed with Cephalosporin use.      Patient Measurements: Height: 5' 2.5" (158.8 cm) Weight: 51.5 kg (113 lb 8.6 oz) IBW/kg (Calculated) : 51.25 Heparin Dosing Weight: TBW  Vital Signs: Temp: 98.4 F (36.9 C) (11/09 0430) Temp Source: Oral (11/09 0430) BP: 157/87 (11/09 0430) Pulse Rate: 55 (11/09 0430)  Labs: Recent Labs    01/06/21 0346 01/07/21 0542 01/08/21 0440  HGB 9.0* 9.1* 9.1*  HCT 29.5* 30.1* 30.5*  PLT 174 192 190  CREATININE 5.46* 7.06*  --      Estimated Creatinine Clearance: 6.3 mL/min (A) (by C-G formula based on SCr of 7.06 mg/dL (H)).   Assessment: 51 yof admitted with an infected RUE fistula. Apixaban from PTA for Aflutter held on admit, last dose 10/24. She is s/p ligation right arm AV fistula and I&D right arm wound on 10/28. Per ID, patient will receive vancomycin for 6 weeks (through 12/14) followed by suppressive therapy.   Pt is s/p TDC>>Palindrome conversion. After getting 2 additional dose of vanc while on schedule vanc, random vanc level came back at 82 on 11/1. Patient received 3 hours of HD on 11/2 and 2.5h hours 11/3. Pre-HD level 11/3 was 44 mcg/ml.    11/6: Calculated Vancomycin concentration prior to HD 11/5 was 17- received ~3 hours HD 11/5 with blood flow rate of 400 should have put patient in  therapeutic range with 500 mg dose. VR 11/6 supratherapeutic at 34 (goal 15-25) following 500 mg dose of Vancomycin with last HD session on 11/5. Dose was documented as "not given" on the [facility], but per the comments, HD nurse told the floor nurse the appropriate vancomycin dose was given.   11/9: VR 22 mcg/ml which is within range for pre-Hd levels. Will start schedule of 500mg  TTS after HD starting 11/10   Goal of Therapy:  PreHD vanc: 15-25   Plan:   Start vancomycin 500mg  IV after HD TTS starting 11/10  Cynda Soule A. Levada Dy, PharmD, BCPS, FNKF Clinical Pharmacist [place] Please utilize Amion for appropriate phone number to reach the unit pharmacist ([place])  01/08/2021 8:16 AM

## 2021-01-08 NOTE — Progress Notes (Signed)
Nephrology Progress Note:   Patient ID: [name], female   DOB: 18-Sep-1950, 70 y.o.   MRN: [id number]  S: last HD on 11/8 with 2 kg UF.  She feels ok.  Not sure of discharge plans - hopes to go home soon   Review of systems:  Denies shortness of breath or chest pain Denies n/v   O:BP (!) 153/75 (BP Location: Left Leg)   Pulse (!) 57   Temp 98.4 F (36.9 C) (Oral)   Resp 18   Ht 5' 2.5" (1.588 m)   Wt 51.5 kg   LMP  (LMP Unknown)   SpO2 100%   BMI 20.44 kg/m   Intake/Output Summary (Last 24 hours) at 01/08/2021 0956 Last data filed at 01/07/2021 1841 Gross per 24 hour  Intake 400 ml  Output 2030 ml  Net -1630 ml   Intake/Output: I/O last 3 completed shifts: In: 400 [I.V.:400] Out: 2080 [Drains:50; Other:2030]  Intake/Output this shift:  No intake/output data recorded. Weight change:    Physical exam:  General adult female in bed in no acute distress HEENT normocephalic atraumatic extraocular movements intact sclera anicteric Neck supple trachea midline Lungs clear to auscultation bilaterally normal work of breathing at rest  Heart S1S2 no rub Abdomen soft nontender nondistended Extremities no edema RUE with wound VAC in place Psych normal mood and affect Access: left IJ tunn catheter in place  Recent Labs  Lab 01/02/21 0349 01/03/21 0503 01/04/21 0436 01/05/21 0635 01/06/21 0346 01/07/21 0542  NA 139 133* 131* 133* 135 136  K 3.9 3.7 4.2 4.2 4.4 4.8  CL 105 99 97* 99 102 99  CO2 27 27 27 25 26 28   GLUCOSE 83 79 99 76 80 72  BUN 23 12 21 11 23  34*  CREATININE 5.26* 3.89* 5.57* 3.92* 5.46* 7.06*  CALCIUM 8.1* 8.8* 9.0 8.8* 8.9 8.9    CBC: Recent Labs  Lab 01/04/21 0436 01/05/21 0635 01/06/21 0346 01/07/21 0542 01/08/21 0440  WBC 4.8 4.3 4.6 4.3 4.6  HGB 9.8* 9.7* 9.0* 9.1* 9.1*  HCT 32.1* 32.0* 29.5* 30.1* 30.5*  MCV 101.6* 101.3* 102.1* 103.1* 104.8*  PLT 174 174 174 192 190   CBG: Recent Labs  Lab 01/07/21 1423 01/07/21 2015  01/08/21 0004 01/08/21 0434 01/08/21 0650  GLUCAP 94 140* 87 67* 89    Iron Studies: No results for input(s): IRON, TIBC, TRANSFERRIN, FERRITIN in the last 72 hours. Studies/Results: No results found.  apixaban  2.5 mg Oral BID   calcitRIOL  1.25 mcg Oral Q T,Th,Sa-HD   Chlorhexidine Gluconate Cloth  6 each Topical Q0600   cinacalcet  60 mg Oral Q T,Th,Sa-HD   darbepoetin (ARANESP) injection - DIALYSIS  200 mcg Intravenous Q Thu-HD   dolutegravir  50 mg Oral q1600   fentaNYL  1 patch Transdermal G31D   folic acid  1 mg Oral Daily   lamiVUDine  50 mg Oral Daily   methadone  2.5 mg Oral Q2000   metoprolol tartrate  50 mg Oral BID   mirtazapine  15 mg Oral QHS   multivitamin  1 tablet Oral QHS   pantoprazole  40 mg Oral QAC breakfast   [START ON 01/09/2021] vancomycin  500 mg Intravenous Q T,Th,Sa-HD   venlafaxine XR  150 mg Oral Q breakfast   zidovudine  300 mg Oral Daily    BMET    Component Value Date/Time   NA 136 01/07/2021 0542   K 4.8 01/07/2021 0542   CL  99 01/07/2021 0542   CO2 28 01/07/2021 0542   GLUCOSE 72 01/07/2021 0542   BUN 34 (H) 01/07/2021 0542   CREATININE 7.06 (H) 01/07/2021 0542   CREATININE 5.66 (H) 12/22/2019 1117   CALCIUM 8.9 01/07/2021 0542   CALCIUM 9.0 03/12/2011 0527   GFRNONAA 6 (L) 01/07/2021 0542   GFRNONAA 5 (L) 02/12/2015 1000   GFRAA 9 (L) 10/22/2019 0105   GFRAA 5 (L) 02/12/2015 1000   CBC    Component Value Date/Time   WBC 4.6 01/08/2021 0440   RBC 2.91 (L) 01/08/2021 0440   HGB 9.1 (L) 01/08/2021 0440   HCT 30.5 (L) 01/08/2021 0440   PLT 190 01/08/2021 0440   MCV 104.8 (H) 01/08/2021 0440   MCH 31.3 01/08/2021 0440   MCHC 29.8 (L) 01/08/2021 0440   RDW 19.8 (H) 01/08/2021 0440   LYMPHSABS 1.1 12/25/2020 0409   MONOABS 0.7 12/25/2020 0409   EOSABS 0.1 12/25/2020 0409   BASOSABS 0.0 12/25/2020 0409    Dialysis Orders: Center: [facility]  on TTS . EDW 41.5kg HD Bath 2K/2.5Ca  Time 3 hours Heparin 1000 units bolus  then 500 units/hr. 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  appreciate vascular surgery evaluation-  s/p ligation and debridement 10/28.  Now with wound VAC in place.  On vanc until December 14. MRSA on culture.  We have communicated to her dialysis unit that she will need vancomycin until December 14  ESRD -  Continue HD per her usual TTS schedule.  We have contacted her outpatient dialysis unit regarding antibiotics as above.   Hypertension/volume  - UF as able.  She has prespecified max of 3.5 liters.  Home med metoprolol and note also arrhythmia   Anemia CKD - Continue aranesp - is ordered 200 mcg weekly   Metabolic bone disease -  continue with calcitriol and sensipar AFib/FL:  improved with increased metoprolol dose per charting HIV - regimen per primary team   Disposition per primary team  Claudia Desanctis, MD 01/08/2021 10:03 AM

## 2021-01-08 NOTE — Progress Notes (Signed)
Occupational Therapy Treatment Patient Details Name: [name] MRN: [id number] DOB: 1951/01/31 Today's Date: 01/08/2021   History of present illness 70 y.o. female presents to [facility] hospital on 12/24/2020 with RUE fistula infection. Pt underwent temporary HDU catheter placement on 12/26/2020. S/p ligation and debridement 12/27/2020. S/p placement of skin substitute over the fistula and wound VAC 01/07/2021. PMH includes ESRD on HDU TTS, HIV, a flutter.   OT comments  Patient seen by skilled OT to address RUE ROM for hand and elbow while supine. Patient tolerated PROM to RUE wrist extension with 10 degrees past neutral and active wrist flexion. Patient limited on RUE finger flexion at PIP and MCPs with most restrictions on first digit. Patient demonstrated increased RUE supination and elbow extension. Acute OT to continue to follow.    Recommendations for follow up therapy are one component of a multi-disciplinary discharge planning process, led by the attending physician.  Recommendations may be updated based on patient status, additional functional criteria and insurance authorization.    Follow Up Recommendations  Long-term institutional care without follow-up therapy    Assistance Recommended at Discharge Frequent or constant Supervision/Assistance  Equipment Recommendations  None recommended by OT    Recommendations for Other Services      Precautions / Restrictions Precautions Precautions: Fall Precaution Comments: R arm wound vac Restrictions Weight Bearing Restrictions: No       Mobility Bed Mobility Overal bed mobility: Needs Assistance Bed Mobility: Supine to Sit;Sit to Supine     Supine to sit: Min assist Sit to supine: Min assist   General bed mobility comments: use of bed pad to scoot hips forward, assist for LE back into bed    Transfers Overall transfer level: Needs assistance Equipment used: Rolling walker (2 wheels);1 person hand held assist   Sit to  Stand: Min assist           General transfer comment: MinA to rise, cues for hand placement     Balance Overall balance assessment: Needs assistance Sitting-balance support: No upper extremity supported;Feet supported Sitting balance-Leahy Scale: Fair     Standing balance support: Bilateral upper extremity supported Standing balance-Leahy Scale: Poor                             ADL either performed or assessed with clinical judgement   ADL                                              Extremity/Trunk Assessment Upper Extremity Assessment Upper Extremity Assessment: Defer to OT evaluation            Vision       Perception     Praxis      Cognition Arousal/Alertness: Awake/alert Behavior During Therapy: WFL for tasks assessed/performed Overall Cognitive Status: No family/caregiver present to determine baseline cognitive functioning                                 General Comments: Patient continues to require verbal cues to perform HEP correctly          Exercises Exercises: Other exercises General Exercises - Upper Extremity Shoulder Flexion: AAROM;Right;10 reps;Seated;PROM Elbow Flexion: AROM;Right;10 reps;Seated Wrist Flexion: AAROM;Right;10 reps;Seated Wrist Extension: AAROM;Right;10 reps;Seated Digit Composite Flexion: AAROM;Right;10 reps;Seated  Hand Exercises Forearm Supination: AROM;Right;10 reps;Seated Other Exercises Other Exercises: PROM/stretching to RUE wrist flexion/extension Other Exercises: PROM/stretching RUE finger flexion Other Exercises: SROM wrist extension 10 reps Other Exercises: SROM RUE supination Other Exercises: AROM elbow extension   Shoulder Instructions       General Comments      Pertinent Vitals/ Pain       Pain Assessment: Faces Faces Pain Scale: Hurts even more Pain Location: RUE Pain Descriptors / Indicators: Aching;Burning;Sore Pain Intervention(s): Monitored  during session  Home Living                                          Prior Functioning/Environment              Frequency  Min 2X/week        Progress Toward Goals  OT Goals(current goals can now be found in the care plan section)  Progress towards OT goals: Progressing toward goals  Acute Rehab OT Goals Patient Stated Goal: go home OT Goal Formulation: With patient Time For Goal Achievement: 01/17/21 Potential to Achieve Goals: Good ADL Goals Pt Will Perform Eating: with set-up;sitting Pt Will Perform Grooming: sitting;with supervision Pt Will Perform Upper Body Bathing: with min assist;sitting Pt Will Perform Upper Body Dressing: with min assist;sitting Pt Will Transfer to Toilet: with mod assist;squat pivot transfer;bedside commode Pt Will Perform Toileting - Clothing Manipulation and hygiene: with mod assist;sitting/lateral leans Pt/caregiver will Perform Home Exercise Program: Increased ROM;Increased strength;Right Upper extremity;With written HEP provided;With Supervision Additional ADL Goal #1: Pt will be independent with positioning Rt UE  Plan Discharge plan remains appropriate    Co-evaluation                 AM-PAC OT "6 Clicks" Daily Activity     Outcome Measure   Help from another person eating meals?: A Little Help from another person taking care of personal grooming?: A Little Help from another person toileting, which includes using toliet, bedpan, or urinal?: Total Help from another person bathing (including washing, rinsing, drying)?: Total Help from another person to put on and taking off regular upper body clothing?: Total Help from another person to put on and taking off regular lower body clothing?: Total 6 Click Score: 10    End of Session    OT Visit Diagnosis: Muscle weakness (generalized) (M62.81);Pain Pain - Right/Left: Right Pain - part of body: Arm   Activity Tolerance Patient tolerated treatment well    Patient Left in bed;with call bell/phone within reach;with bed alarm set   Nurse Communication Other (comment) (on patient's HEP)        Time: 1443-1540 OT Time Calculation (min): 27 min  Charges: OT General Charges $OT Visit: 1 Visit OT Treatments $Therapeutic Exercise: 23-37 mins  [name], [place]  Pager [phone] Office [place] 01/08/2021, 1:27 PM

## 2021-01-08 NOTE — Progress Notes (Addendum)
[facility] [place]    Assessment/Planning: POD # 1  Procedure Performed: 1.  Application of skin substitute to right upper extremity total 22.5 cm using myriad matrix with 500 mg of morsels and 7 x 10 cm 3 layer 2.  Application of negative pressure dressing  70 year old female with end-stage renal disease previously had a fistula in her right upper extremity which had a necrotic area and was removed.  This was converted to an upper arm fistula but unfortunately she had necrosis of her wound.  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  The fistula has now been ligated and she 2 areas of open wound on the right arm 1 with exposed fistula stump.   She will f/u as an OP for wound vac checks.  First open vac check in 1 week(7 days)  Subjective  - Tired from return to OR   Objective (!) 157/87 (!) 55 98.4 F (36.9 C) (Oral) 18 99%  Intake/Output Summary (Last 24 hours) at 01/08/2021 0802 Last data filed at 01/07/2021 1841 Gross per 24 hour  Intake 400 ml  Output 2030 ml  Net -1630 ml    Right UE with wound vac to suction with good seal Hand warm with palpable radial pulse, minimal edema in the right UE.  Will get wound size estimate  form DR. Donzetta Matters and order home vac for SNF return.      Roxy Horseman 01/08/2021 8:02 AM --  Laboratory Lab Results: Recent Labs    01/07/21 0542 01/08/21 0440  WBC 4.3 4.6  HGB 9.1* 9.1*  HCT 30.1* 30.5*  PLT 192 190   BMET Recent Labs    01/06/21 0346 01/07/21 0542  NA 135 136  K 4.4 4.8  CL 102 99  CO2 26 28  GLUCOSE 80 72  BUN 23 34*  CREATININE 5.46* 7.06*  CALCIUM 8.9 8.9    COAG Lab Results  Component Value Date   INR 1.14 12/28/2011   INR 1.10 12/13/2010   INR 1.10 01/26/2010   No results found for: PTT  I have independently interviewed and examined patient and agree with PA assessment and plan above.    Wagner Tanzi C. Donzetta Matters, MD [facility] [place] Office: [phone] Pager: [phone]

## 2021-01-08 NOTE — Progress Notes (Signed)
Physical Therapy Treatment Patient Details Name: [name] MRN: [id number] DOB: [birth year] Today's Date: 01/08/2021   History of Present Illness 70 y.o. female presents to [facility] hospital on 12/24/2020 with RUE fistula infection. Pt underwent temporary HDU catheter placement on 12/26/2020. S/p ligation and debridement 12/27/2020. S/p placement of skin substitute over the fistula and wound VAC 01/07/2021. PMH includes ESRD on HDU TTS, HIV, a flutter.    PT Comments    Pt reporting RUE pain, however, she is agreeable to participate. Session focused on therapeutic exercises for right upper extremity ROM and functional mobility. Pt ambulating 60 feet with a walker at a min assist level. Will continue to benefit from acute PT to progress mobility as tolerated.     Recommendations for follow up therapy are one component of a multi-disciplinary discharge planning process, led by the attending physician.  Recommendations may be updated based on patient status, additional functional criteria and insurance authorization.  Follow Up Recommendations  Skilled nursing-short term rehab (<3 hours/day)     Assistance Recommended at Discharge Intermittent Supervision/Assistance  Equipment Recommendations  None recommended by PT    Recommendations for Other Services       Precautions / Restrictions Precautions Precautions: Fall Precaution Comments: R arm wound vac Restrictions Weight Bearing Restrictions: No     Mobility  Bed Mobility Overal bed mobility: Needs Assistance Bed Mobility: Supine to Sit;Sit to Supine     Supine to sit: Min assist Sit to supine: Min assist   General bed mobility comments: use of bed pad to scoot hips forward, assist for LE back into bed    Transfers Overall transfer level: Needs assistance Equipment used: Rolling walker (2 wheels);1 person hand held assist   Sit to Stand: Min assist           General transfer comment: MinA to rise, cues for hand  placement    Ambulation/Gait Ambulation/Gait assistance: Min assist Gait Distance (Feet): 60 Feet Assistive device: Rolling walker (2 wheels) Gait Pattern/deviations: Step-through pattern;Decreased stride length;Narrow base of support Gait velocity: decreased Gait velocity interpretation: <1.8 ft/sec, indicate of risk for recurrent falls   General Gait Details: slow pace, slightly tremulous, minA for balance   Stairs             Wheelchair Mobility    Modified Rankin (Stroke Patients Only)       Balance Overall balance assessment: Needs assistance Sitting-balance support: No upper extremity supported;Feet supported Sitting balance-Leahy Scale: Fair     Standing balance support: Bilateral upper extremity supported Standing balance-Leahy Scale: Poor                              Cognition Arousal/Alertness: Awake/alert Behavior During Therapy: WFL for tasks assessed/performed Overall Cognitive Status: No family/caregiver present to determine baseline cognitive functioning                                 General Comments: Pt pleasant, quieter this session and more withdrawn, likely due to pain        Exercises General Exercises - Upper Extremity Shoulder Flexion: AAROM;Right;10 reps;Seated;PROM Elbow Flexion: AROM;Right;10 reps;Seated Wrist Flexion: AAROM;Right;10 reps;Seated Wrist Extension: AAROM;Right;10 reps;Seated Digit Composite Flexion: AAROM;Right;10 reps;Seated Hand Exercises Forearm Supination: AROM;Right;10 reps;Seated    General Comments        Pertinent Vitals/Pain Pain Assessment: Faces Faces Pain Scale: Hurts even more Pain Location:  RUE Pain Descriptors / Indicators: Aching;Burning;Sore Pain Intervention(s): Monitored during session;Limited activity within patient's tolerance;Premedicated before session    Home Living                          Prior Function            PT Goals (current goals  can now be found in the care plan section) Acute Rehab PT Goals Patient Stated Goal: to reduce pain PT Goal Formulation: With patient Time For Goal Achievement: 01/22/21 Potential to Achieve Goals: Fair Progress towards PT goals: Progressing toward goals    Frequency    Min 2X/week      PT Plan Current plan remains appropriate    Co-evaluation              AM-PAC PT "6 Clicks" Mobility   Outcome Measure  Help needed turning from your back to your side while in a flat bed without using bedrails?: A Little Help needed moving from lying on your back to sitting on the side of a flat bed without using bedrails?: A Little Help needed moving to and from a bed to a chair (including a wheelchair)?: A Little Help needed standing up from a chair using your arms (e.g., wheelchair or bedside chair)?: A Little Help needed to walk in hospital room?: A Little Help needed climbing 3-5 steps with a railing? : A Lot 6 Click Score: 17    End of Session   Activity Tolerance: Patient tolerated treatment well Patient left: in bed;with call bell/phone within reach;with bed alarm set Nurse Communication: Mobility status PT Visit Diagnosis: Other abnormalities of gait and mobility (R26.89);Muscle weakness (generalized) (M62.81);Pain Pain - Right/Left: Right Pain - part of body: Arm     Time: 1030-1056 PT Time Calculation (min) (ACUTE ONLY): 26 min  Charges:  $Therapeutic Exercise: 8-22 mins $Therapeutic Activity: 8-22 mins                     Wyona Almas, PT, DPT Acute Rehabilitation Services Pager [phone] Office [phone]    Deno Etienne 01/08/2021, 11:42 AM

## 2021-01-08 NOTE — Progress Notes (Signed)
This chaplain followed up with spiritual care presence and a gift of a Bible.  The chaplain understands the Pt. is pleased with yesterday's procedure and her right arm and hand feels better to the touch. The Pt. is anticipating discharge today or tomorrow.  The chaplain accepted the Pt. invitation to close the visit with prayer.  Chaplain Sallyanne Kuster [phone]

## 2021-01-09 DIAGNOSIS — L089 Local infection of the skin and subcutaneous tissue, unspecified: Secondary | ICD-10-CM | POA: Diagnosis not present

## 2021-01-09 DIAGNOSIS — B20 Human immunodeficiency virus [HIV] disease: Secondary | ICD-10-CM | POA: Diagnosis not present

## 2021-01-09 DIAGNOSIS — L988 Other specified disorders of the skin and subcutaneous tissue: Secondary | ICD-10-CM | POA: Diagnosis not present

## 2021-01-09 DIAGNOSIS — I10 Essential (primary) hypertension: Secondary | ICD-10-CM | POA: Diagnosis not present

## 2021-01-09 LAB — RENAL FUNCTION PANEL
Albumin: 2.2 g/dL — ABNORMAL LOW (ref 3.5–5.0)
Anion gap: 8 (ref 5–15)
BUN: 33 mg/dL — ABNORMAL HIGH (ref 8–23)
CO2: 27 mmol/L (ref 22–32)
Calcium: 8.5 mg/dL — ABNORMAL LOW (ref 8.9–10.3)
Chloride: 100 mmol/L (ref 98–111)
Creatinine, Ser: 6.37 mg/dL — ABNORMAL HIGH (ref 0.44–1.00)
GFR, Estimated: 7 mL/min — ABNORMAL LOW (ref >60)
Glucose, Bld: 92 mg/dL (ref 70–99)
Phosphorus: 5.3 mg/dL — ABNORMAL HIGH (ref 2.5–4.6)
Potassium: 4.5 mmol/L (ref 3.5–5.1)
Sodium: 135 mmol/L (ref 135–145)

## 2021-01-09 LAB — GLUCOSE, CAPILLARY
Glucose-Capillary: 107 mg/dL — ABNORMAL HIGH (ref 70–99)
Glucose-Capillary: 121 mg/dL — ABNORMAL HIGH (ref 70–99)
Glucose-Capillary: 59 mg/dL — ABNORMAL LOW (ref 70–99)
Glucose-Capillary: 59 mg/dL — ABNORMAL LOW (ref 70–99)
Glucose-Capillary: 93 mg/dL (ref 70–99)
Glucose-Capillary: 95 mg/dL (ref 70–99)
Glucose-Capillary: 99 mg/dL (ref 70–99)

## 2021-01-09 MED ORDER — SODIUM CHLORIDE 0.9 % IV SOLN: 100.0000 mL | INTRAVENOUS | Status: DC | PRN

## 2021-01-09 MED ORDER — PENTAFLUOROPROP-TETRAFLUOROETH EX AERO: 1.0000 "application " | INHALATION_SPRAY | CUTANEOUS | Status: DC | PRN

## 2021-01-09 MED ORDER — OXYCODONE HCL 10 MG PO TABS: 10.0000 mg | ORAL_TABLET | Freq: Two times a day (BID) | ORAL | 0 refills | Status: DC | PRN

## 2021-01-09 MED ORDER — METHADONE HCL 5 MG PO TABS: 2.5000 mg | ORAL_TABLET | Freq: Every day | ORAL | 0 refills | Status: DC

## 2021-01-09 MED ORDER — HEPARIN SODIUM (PORCINE) 1000 UNIT/ML DIALYSIS
1000.0000 [IU] | INTRAMUSCULAR | Status: DC | PRN
  Filled 2021-01-09 (×2): qty 1

## 2021-01-09 MED ORDER — CALCITRIOL 0.5 MCG PO CAPS
ORAL_CAPSULE | ORAL | Status: AC
  Administered 2021-01-09: 1 ug
  Filled 2021-01-09: qty 2

## 2021-01-09 MED ORDER — CALCITRIOL 1 MCG/ML IV SOLN
INTRAVENOUS | Status: AC
  Filled 2021-01-09: qty 2

## 2021-01-09 MED ORDER — LIDOCAINE HCL (PF) 1 % IJ SOLN: 5.0000 mL | INTRAMUSCULAR | Status: DC | PRN

## 2021-01-09 MED ORDER — ALTEPLASE 2 MG IJ SOLR: 2.0000 mg | Freq: Once | INTRAMUSCULAR | Status: DC | PRN

## 2021-01-09 MED ORDER — FENTANYL 25 MCG/HR TD PT72: 1.0000 | MEDICATED_PATCH | TRANSDERMAL | 0 refills | Status: DC

## 2021-01-09 MED ORDER — VANCOMYCIN HCL IN DEXTROSE 500-5 MG/100ML-% IV SOLN: 500.0000 mg | INTRAVENOUS | 0 refills | Status: AC

## 2021-01-09 MED ORDER — LIDOCAINE-PRILOCAINE 2.5-2.5 % EX CREA: 1.0000 "application " | TOPICAL_CREAM | CUTANEOUS | Status: DC | PRN

## 2021-01-09 MED ORDER — CALCITRIOL 0.25 MCG PO CAPS
ORAL_CAPSULE | ORAL | Status: AC
  Administered 2021-01-09: 0.25 ug
  Filled 2021-01-09: qty 1

## 2021-01-09 NOTE — Progress Notes (Signed)
Nursing report called to nurse Denny Peon at [facility].

## 2021-01-09 NOTE — TOC Progression Note (Signed)
Transition of Care [facility]) - Progression Note    Patient Details  Name: NILDA KEATHLEY MRN: [id number] Date of Birth: 28-Feb-1951  [facility]) CM/SW Contact  Sharlet Salina Mila Homer, LCSW Phone Number: 01/09/2021, 8:16 AM  Clinical Narrative:  Patient is from [facility] and [place] continuing to follow for discharge planning needs.     Expected Discharge Plan: [place] Barriers to Discharge: Continued Medical Work up  Expected Discharge Plan and Services Expected Discharge Plan: [place] Choice: [place] arrangements for the past 2 months: [place]                                       Social Determinants of Health (SDOH) Interventions  None needed or requested at this time.  Readmission Risk Interventions Readmission Risk Prevention Plan 10/22/2019  Transportation Screening Complete  PCP or Specialist Appt within 3-5 Days Not Complete  Not Complete comments SNF MD to follow  [place] or [place] Not Complete  HRI or Home Care Consult comments Pt resides in a SNF  Social Work Consult for [place] Planning/Counseling [place] Not Applicable  Medication Review Press photographer) Complete  Some recent data might be hidden

## 2021-01-09 NOTE — TOC Transition Note (Signed)
Transition of Care [facility]) - CM/SW Discharge Note *Discharged back to [facility]   Patient Details  Name: [name] MRN: [id number] Date of Birth: Dec 05, 1950  Transition of Care [facility]) CM/SW Contact:  Sable Feil, LCSW Phone Number: 01/09/2021, 5:28 PM   Clinical Narrative:   Patient medically stable for discharge and returning to [facility] in [place], where she is a long-term care resident. Contacted admissions director, Melissa regarding discharge and d/c clinicals, including FL-2 transmitted to facility. CSW contacted patient's daughter [name] and friend [name] and informed them of patient's discharge. Ms. Donlan will be transported to facility via non-emergency ambulance transport - [facility].    Final next level of care: [place] Barriers to Discharge: Barriers Resolved   Patient Goals and CMS Choice Patient states their goals for this hospitalization and ongoing recovery are:: Patient agreeable to returning to [facility], where she is a TC resident [email or web address] Compare Post Acute Care list provided to:: Other (Comment Required) (Not needed as patient LTC at [facility]) Choice offered to / list presented to : NA  Discharge Placement   Existing PASRR number confirmed : 12/29/20          Patient chooses bed at: [facility] Patient to be transferred to facility by: Non-emergency ambulance transport Name of family member notified: Daughter [name] [phone]) and friend [name] [phone]) Patient and family notified of of transfer: 01/09/21  Discharge Plan and Services - Return to nursing facility for continued nursing care      Post Acute Care Choice: Skilled Nursing Facility                               Social Determinants of Health (SDOH) Interventions  None requested or needed at discharge   Readmission Risk Interventions Readmission Risk  Prevention Plan 10/22/2019  Transportation Screening Complete  PCP or Specialist Appt within 3-5 Days Not Complete  Not Complete comments SNF MD to follow  [place] or [place] Not Complete  HRI or Home Care Consult comments Pt resides in a SNF  Social Work Consult for [place] Planning/Counseling Complete  Palliative Care Screening Not Applicable  Medication Review Press photographer) Complete  Some recent data might be hidden

## 2021-01-09 NOTE — Progress Notes (Addendum)
  Progress Note    01/09/2021 8:08 AM 2 Days Post-Op  Subjective:  Seen on HD. No new complaints   Vitals:   01/09/21 0731 01/09/21 0741  BP: (!) 182/99 (!) 171/96  Pulse: 61 (!) 59  Resp: 19 15  Temp: 97.7 F (36.5 C)   SpO2:     Physical Exam: Lungs:  non labored Incisions:  wound vac in place R arm with good seal Extremities: palpable R radial pulse Neurologic: A&O  CBC    Component Value Date/Time   WBC 4.6 01/08/2021 0440   RBC 2.91 (L) 01/08/2021 0440   HGB 9.1 (L) 01/08/2021 0440   HCT 30.5 (L) 01/08/2021 0440   PLT 190 01/08/2021 0440   MCV 104.8 (H) 01/08/2021 0440   MCH 31.3 01/08/2021 0440   MCHC 29.8 (L) 01/08/2021 0440   RDW 19.8 (H) 01/08/2021 0440   LYMPHSABS 1.1 12/25/2020 0409   MONOABS 0.7 12/25/2020 0409   EOSABS 0.1 12/25/2020 0409   BASOSABS 0.0 12/25/2020 0409    BMET    Component Value Date/Time   NA 135 01/09/2021 0320   K 4.5 01/09/2021 0320   CL 100 01/09/2021 0320   CO2 27 01/09/2021 0320   GLUCOSE 92 01/09/2021 0320   BUN 33 (H) 01/09/2021 0320   CREATININE 6.37 (H) 01/09/2021 0320   CREATININE 5.66 (H) 12/22/2019 1117   CALCIUM 8.5 (L) 01/09/2021 0320   CALCIUM 9.0 03/12/2011 0527   GFRNONAA 7 (L) 01/09/2021 0320   GFRNONAA 5 (L) 02/12/2015 1000   GFRAA 9 (L) 10/22/2019 0105   GFRAA 5 (L) 02/12/2015 1000    INR    Component Value Date/Time   INR 1.14 12/28/2011 1403     Intake/Output Summary (Last 24 hours) at 01/09/2021 9518 Last data filed at 01/09/2021 0323 Gross per 24 hour  Intake 715 ml  Output 0 ml  Net 715 ml     Assessment/Plan:  70 y.o. female is s/p application of skin substitute with wound vac R arm 2 Days Post-Op   R arm well perfused with palpable R radial pulse Wound vac with good seal; office will arrange wound vac change in 1 week Ok for discharge back to SNF from vascular standpoint   Dagoberto Ligas, PA-C [facility] [phone] 01/09/2021 8:08 AM   I have  independently interviewed and examined patient and agree with PA assessment and plan above.   Jayleen Scaglione C. Donzetta Matters, MD [facility] [place] Office: [phone] Pager: [phone]

## 2021-01-09 NOTE — Discharge Summary (Addendum)
Physician Discharge Summary  AMNEET CENDEJAS TOI:[id number] DOB: [birth year] DOA: 12/24/2020  PCP: Hilbert Corrigan, MD  Admit date: 12/24/2020 Discharge date: 01/09/2021  Time spent: 60 minutes  Recommendations for Outpatient Follow-up:  Patient will need IV vancomycin with dialysis for 6 weeks, last day 02/12/2021 Follow-up vascular surgery for wound VAC change in 1 week  Discharge Diagnoses:  Active Problems:   Human immunodeficiency virus (HIV) disease ([place])   Essential hypertension   ESRD on dialysis ([place])   Atrial flutter ([place])   Anemia of chronic disease   Infected fluid collection with fistula   Hypoglycemia   Discharge Condition: Stable  Diet recommendation: Renal diet  Filed Weights   01/07/21 1454 01/07/21 2016 01/09/21 0731  Weight: 48.9 kg 51.5 kg 52.8 kg    History of present illness:  70 year old female with a history of ESRD on HD TTS, HIV, atrial flutter on Eliquis, AV fistula occlusion 4 weeks ago requiring balloon angioplasty and conversion of right forearm fistula to right upper extremity fistula presented with right upper extremity fistula infection and was transferred from [place].  Vascular surgery, nephrology, IR and ID were consulted.  She underwent ligation of AV fistula and incision and drainage on 12/27/2020.  She had tunneled catheter placement on 12/30/2020.  She returned to the OR on 12/31/2020 by vascular surgery for washout and wound VAC placement.  Wound VAC was changed on 01/03/2021.  She underwent placement of skin substitute as per vascular surgery on 01/07/2021.  Hospital Course:  MRSA /Right AV fistula infection -S/p incision and drainage, ligation of AV fistula on 10/28 S/p OR on 12/31/2020 for washout and wound VAC placement -Underwent skin s substitute placement on 01/07/2021 -ID recommends IV vancomycin with dialysis for total 6 weeks, last day 02/12/2021 -Wound VAC as per vascular surgery -Vascular surgery will follow up as  outpatient   ESRD on hemodialysis -Continue hemodialysis as outpatient   Atrial flutter -Continue metoprolol -Continue apixaban for anticoagulation   HIV -CD4 252 -Continue antiretroviral therapy   Anemia of chronic disease -Hemoglobin is stable   History of chronic pain syndrome -On fentanyl patch, methadone -Outpatient follow-up with pain management   Severe protein calorie malnutrition -Encourage oral intake  Procedures: Incision and drainage, ligation of AV fistula on 10/28 S/p OR on 12/31/2020 for washout and wound VAC placement Underwent skin substitute placement on 01/07/2021  Consultations: Vascular surgery  Discharge Exam: Vitals:   01/09/21 1203 01/09/21 1608  BP: (!) 147/82 128/82  Pulse: 63 (!) 54  Resp: 18 17  Temp:  98.1 F (36.7 C)  SpO2: 98% 100%    General: Appears in no acute distress Cardiovascular: S1-S2, regular, no murmur auscultated Respiratory: Clear to auscultation bilaterally  Discharge Instructions   Discharge Instructions     Diet - low sodium heart healthy   Complete by: As directed    Increase activity slowly   Complete by: As directed    No wound care   Complete by: As directed       Allergies as of 01/09/2021       Reactions   Lactose Intolerance (gi) Other (See Comments)   On MAR   Latex Rash, Itching   Penicillins Rash, Swelling   Has patient had a PCN reaction causing immediate rash, facial/tongue/throat swelling, SOB or lightheadedness with hypotension: No Has patient had a PCN reaction causing severe rash involving mucus membranes or skin necrosis: No Has patient had a PCN reaction that required hospitalization No Has patient had  a PCN reaction occurring within the last 10 years: No If all of the above answers are "NO", then may proceed with Cephalosporin use.        Medication List     STOP taking these medications    ondansetron 8 MG tablet Commonly known as: ZOFRAN       TAKE these medications     apixaban 2.5 MG Tabs tablet Commonly known as: ELIQUIS Take 1 tablet (2.5 mg total) by mouth 2 (two) times daily.   baclofen 10 MG tablet Commonly known as: LIORESAL Take 10 mg by mouth daily as needed for muscle spasms.   clopidogrel 75 MG tablet Commonly known as: PLAVIX Take 1 tablet (75 mg total) by mouth daily with breakfast.   collagenase ointment Commonly known as: SANTYL Apply 1 application topically daily. Apply to sacrum topically every day shift for wound healing   cyanocobalamin 1000 MCG/ML injection Commonly known as: (VITAMIN B-12) Inject 1,000 mcg into the muscle every 30 (thirty) days. On the 28th of every month   fentaNYL 25 MCG/HR Commonly known as: [place] 1 patch onto the skin every 3 (three) days.   lamivudine 100 MG tablet Commonly known as: EPIVIR Take 50 mg by mouth daily. (0800)   Lidocaine HCl 4 % Crea Apply 1 application topically every 12 (twelve) hours as needed (pain).   lidocaine-prilocaine cream Commonly known as: EMLA Apply 1 application topically See admin instructions. (0900) Apply to fistula site one hour before dialysis on Tuesday, Thursday, and Saturday.   melatonin 3 MG Tabs tablet Take 3 mg by mouth at bedtime. (2000)   methadone 5 MG tablet Commonly known as: DOLOPHINE Take 0.5 tablets (2.5 mg total) by mouth daily at 8 pm. (2000)   metoprolol tartrate 25 MG tablet Commonly known as: LOPRESSOR Take 1 tablet (25 mg total) by mouth 2 (two) times daily.   Minerin Lotn Apply 1 application topically every 12 (twelve) hours as needed (dry skin). Apply to feet and ankles   mirtazapine 15 MG tablet Commonly known as: REMERON Take 15 mg by mouth at bedtime. What changed: Another medication with the same name was removed. Continue taking this medication, and follow the directions you see here.   multivitamin with minerals Tabs tablet Take 1 tablet by mouth at bedtime. (2000)   nitroGLYCERIN 0.4 MG SL tablet Commonly  known as: NITROSTAT Place 0.4 mg under the tongue every 5 (five) minutes x 3 doses as needed for chest pain.   ondansetron 8 MG disintegrating tablet Commonly known as: Zofran ODT Take 1 tablet (8 mg total) by mouth every 8 (eight) hours as needed for nausea or vomiting.   Oxycodone HCl 10 MG Tabs Take 1 tablet (10 mg total) by mouth every 12 (twelve) hours as needed (severe pain).   pantoprazole 40 MG tablet Commonly known as: Protonix Take 1 tablet (40 mg total) by mouth daily before breakfast.   PRO-STAT 64 PO Take by mouth. Give 66m by mouth in the morning for wound healing   promethazine 12.5 MG tablet Commonly known as: PHENERGAN Take 12.5 mg by mouth See admin instructions. Take 1 tablet (12.5 mg) by mouth in the morning every Tuesday, Thursday and Saturday for nausea/vomiting and every 6 hours as needed for n/v   senna 8.6 MG tablet Commonly known as: SENOKOT Take 2 tablets by mouth every evening. (2100)   Tivicay 50 MG tablet Generic drug: dolutegravir Take 50 mg by mouth daily at 4 PM.   vancomycin  500-5 MG/100ML-% IVPB Commonly known as: VANCOCIN Inject 100 mLs (500 mg total) into the vein Every Tuesday,Thursday,and Saturday with dialysis. Start taking on: January 11, 2021   venlafaxine XR 150 MG 24 hr capsule Commonly known as: EFFEXOR-XR Take 150 mg by mouth daily with breakfast. (0900)   vitamin C 500 MG tablet Commonly known as: ASCORBIC ACID Take 500 mg by mouth 2 (two) times daily.   zidovudine 100 MG capsule Commonly known as: RETROVIR Take 100 mg by mouth 3 (three) times daily. (0900, 1300, & 2100)   ZINC-220 PO Take 1 tablet by mouth daily.               Durable Medical Equipment  (From admission, onward)           Start     Ordered   01/08/21 0808  For home use only DME Negative pressure wound device  Once       Comments: Vac will be changed weekly  Question Answer Comment  Frequency of dressing change Other see comments    Length of need 3 Months   Dressing type Foam   Amount of suction 125 mm/Hg   Pressure application Continuous pressure   Supplies 10 canisters and 15 dressings per month for duration of therapy      01/08/21 0808           Allergies  Allergen Reactions   Lactose Intolerance (Gi) Other (See Comments)    On MAR   Latex Rash and Itching   Penicillins Rash and Swelling    Has patient had a PCN reaction causing immediate rash, facial/tongue/throat swelling, SOB or lightheadedness with hypotension: No Has patient had a PCN reaction causing severe rash involving mucus membranes or skin necrosis: No Has patient had a PCN reaction that required hospitalization No Has patient had a PCN reaction occurring within the last 10 years: No If all of the above answers are "NO", then may proceed with Cephalosporin use.      Follow-up Information     Waynetta Sandy, MD Follow up on 01/15/2021.   Specialties: Vascular Surgery, Cardiology Why: Office will call you to arrange your appt (sent) Contact information: [address] [place] [address] [phone]                  The results of significant diagnostics from this hospitalization (including imaging, microbiology, ancillary and laboratory) are listed below for reference.    Significant Diagnostic Studies: IR Fluoro Guide CV Line Left  Result Date: 12/26/2020 INDICATION: 70 year old female with history of end-stage renal disease in malfunctioning right upper extremity arteriovenous fistula in the setting of right innominate vein stenosis/stent occlusion. Alternate hemodialysis access is required pending central venous intervention. EXAM: NON-TUNNELED CENTRAL VENOUS HEMODIALYSIS CATHETER PLACEMENT WITH ULTRASOUND AND FLUOROSCOPIC GUIDANCE COMPARISON:  None. MEDICATIONS: None FLUOROSCOPY TIME:  0 minutes, 18 seconds (2 mGy) COMPLICATIONS: None immediate. PROCEDURE: Informed written consent was obtained from the  patient after a discussion of the risks, benefits, and alternatives to treatment. Questions regarding the procedure were encouraged and answered. The left neck and chest were prepped with chlorhexidine in a sterile fashion, and a sterile drape was applied covering the operative field. Maximum barrier sterile technique with sterile gowns and gloves were used for the procedure. A timeout was performed prior to the initiation of the procedure. After the overlying soft tissues were anesthetized, a small venotomy incision was created and a micropuncture kit was utilized to access the internal jugular vein.  Real-time ultrasound guidance was utilized for vascular access including the acquisition of a permanent ultrasound image documenting patency of the accessed vessel. The microwire was utilized to measure appropriate catheter length. A Rosen wire was advanced to the level of the IVC. Under fluoroscopic guidance, the venotomy was serially dilated, ultimately allowing placement of a 20 cm temporary Trialysis catheter with tip ultimately terminating within the superior aspect of the right atrium. Final catheter positioning was confirmed and documented with a spot radiographic image. The catheter aspirates and flushes normally. The catheter was flushed with appropriate volume heparin dwells. The catheter exit site was secured with a 0-Silk retention suture. A dressing was placed. The patient tolerated the procedure well without immediate post procedural complication. IMPRESSION: Successful placement of a left internal jugular approach 20 cm temporary dialysis catheter with tip terminating with in the superior aspect of the right atrium. The catheter is ready for immediate use. PLAN: This catheter may be converted to a tunneled dialysis catheter at a later date as indicated. Ruthann Cancer, MD [facility] Electronically Signed   By: Ruthann Cancer M.D.   On: 12/26/2020  12:45   IR US Guide Vasc Access Left  Result Date: 01/01/2021 INDICATION: 70 year old female with history of end-stage renal disease in malfunctioning right upper extremity arteriovenous fistula in the setting of right innominate vein stenosis/stent occlusion. Alternate hemodialysis access is required pending central venous intervention. EXAM: NON-TUNNELED CENTRAL VENOUS HEMODIALYSIS CATHETER PLACEMENT WITH ULTRASOUND AND FLUOROSCOPIC GUIDANCE COMPARISON:  None. MEDICATIONS: None FLUOROSCOPY TIME:  0 minutes, 18 seconds (2 mGy) COMPLICATIONS: None immediate. PROCEDURE: Informed written consent was obtained from the patient after a discussion of the risks, benefits, and alternatives to treatment. Questions regarding the procedure were encouraged and answered. The left neck and chest were prepped with chlorhexidine in a sterile fashion, and a sterile drape was applied covering the operative field. Maximum barrier sterile technique with sterile gowns and gloves were used for the procedure. A timeout was performed prior to the initiation of the procedure. After the overlying soft tissues were anesthetized, a small venotomy incision was created and a micropuncture kit was utilized to access the internal jugular vein. Real-time ultrasound guidance was utilized for vascular access including the acquisition of a permanent ultrasound image documenting patency of the accessed vessel. The microwire was utilized to measure appropriate catheter length. A Rosen wire was advanced to the level of the IVC. Under fluoroscopic guidance, the venotomy was serially dilated, ultimately allowing placement of a 20 cm temporary Trialysis catheter with tip ultimately terminating within the superior aspect of the right atrium. Final catheter positioning was confirmed and documented with a spot radiographic image. The catheter aspirates and flushes normally. The catheter was flushed with appropriate volume heparin dwells. The catheter exit  site was secured with a 0-Silk retention suture. A dressing was placed. The patient tolerated the procedure well without immediate post procedural complication. IMPRESSION: Successful placement of a left internal jugular approach 20 cm temporary dialysis catheter with tip terminating with in the superior aspect of the right atrium. The catheter is ready for immediate use. PLAN: This catheter may be converted to a tunneled dialysis catheter at a later date as indicated. Ruthann Cancer, MD [facility] Radiology Electronically Signed   By: Ruthann Cancer M.D.   On: 01/01/2021 15:27   IR US Guide Vasc Access Left  Result Date: 12/26/2020 INDICATION: 70 year old female with history of end-stage renal disease in malfunctioning right  upper extremity arteriovenous fistula in the setting of right innominate vein stenosis/stent occlusion. Alternate hemodialysis access is required pending central venous intervention. EXAM: NON-TUNNELED CENTRAL VENOUS HEMODIALYSIS CATHETER PLACEMENT WITH ULTRASOUND AND FLUOROSCOPIC GUIDANCE COMPARISON:  None. MEDICATIONS: None FLUOROSCOPY TIME:  0 minutes, 18 seconds (2 mGy) COMPLICATIONS: None immediate. PROCEDURE: Informed written consent was obtained from the patient after a discussion of the risks, benefits, and alternatives to treatment. Questions regarding the procedure were encouraged and answered. The left neck and chest were prepped with chlorhexidine in a sterile fashion, and a sterile drape was applied covering the operative field. Maximum barrier sterile technique with sterile gowns and gloves were used for the procedure. A timeout was performed prior to the initiation of the procedure. After the overlying soft tissues were anesthetized, a small venotomy incision was created and a micropuncture kit was utilized to access the internal jugular vein. Real-time ultrasound guidance was utilized for vascular access including the  acquisition of a permanent ultrasound image documenting patency of the accessed vessel. The microwire was utilized to measure appropriate catheter length. A Rosen wire was advanced to the level of the IVC. Under fluoroscopic guidance, the venotomy was serially dilated, ultimately allowing placement of a 20 cm temporary Trialysis catheter with tip ultimately terminating within the superior aspect of the right atrium. Final catheter positioning was confirmed and documented with a spot radiographic image. The catheter aspirates and flushes normally. The catheter was flushed with appropriate volume heparin dwells. The catheter exit site was secured with a 0-Silk retention suture. A dressing was placed. The patient tolerated the procedure well without immediate post procedural complication. IMPRESSION: Successful placement of a left internal jugular approach 20 cm temporary dialysis catheter with tip terminating with in the superior aspect of the right atrium. The catheter is ready for immediate use. PLAN: This catheter may be converted to a tunneled dialysis catheter at a later date as indicated. Ruthann Cancer, MD [facility] Radiology Electronically Signed   By: Ruthann Cancer M.D.   On: 12/26/2020 12:45   DG Chest Port 1 View  Result Date: 12/24/2020 CLINICAL DATA:  Recent shunt placement, pain EXAM: PORTABLE CHEST 1 VIEW COMPARISON:  04/06/2020 FINDINGS: Gross cardiomegaly and pulmonary vascular prominence. No acute appearing airspace opacity. Right brachiocephalic venous stent. Surgical clips in the right axilla. IMPRESSION: Gross cardiomegaly and pulmonary vascular prominence. No overt edema or acute appearing airspace opacity. Electronically Signed   By: Delanna Ahmadi M.D.   On: 12/24/2020 16:11    Microbiology: No results found for this or any previous visit (from the past 240 hour(s)).   Labs: Basic Metabolic Panel: Recent Labs  Lab 01/03/21 0503  01/04/21 0436 01/05/21 0635 01/06/21 0346 01/07/21 0542 01/09/21 0320  NA 133* 131* 133* 135 136 135  K 3.7 4.2 4.2 4.4 4.8 4.5  CL 99 97* 99 102 99 100  CO2 _0 GLUCOSE 79 99 76 80 72 92  BUN _1 34* 33*  CREATININE 3.89* 5.57* 3.92* 5.46* 7.06* 6.37*  CALCIUM 8.8* 9.0 8.8* 8.9 8.9 8.5*  MG 2.1 2.0 2.1  --   --   --   PHOS  --   --   --   --   --  5.3*   Liver Function Tests: Recent Labs  Lab 01/09/21 0320  ALBUMIN 2.2*   No results for input(s): LIPASE, AMYLASE in the last 168 hours. No results for input(s): AMMONIA in the  last 168 hours. CBC: Recent Labs  Lab 01/04/21 0436 01/05/21 0635 01/06/21 0346 01/07/21 0542 01/08/21 0440  WBC 4.8 4.3 4.6 4.3 4.6  HGB 9.8* 9.7* 9.0* 9.1* 9.1*  HCT 32.1* 32.0* 29.5* 30.1* 30.5*  MCV 101.6* 101.3* 102.1* 103.1* 104.8*  PLT 174 174 174 192 190   Cardiac Enzymes: No results for input(s): CKTOTAL, CKMB, CKMBINDEX, TROPONINI in the last 168 hours. BNP: BNP (last 3 results) No results for input(s): BNP in the last 8760 hours.  ProBNP (last 3 results) No results for input(s): PROBNP in the last 8760 hours.  CBG: Recent Labs  Lab 01/09/21 0105 01/09/21 0412 01/09/21 1159 01/09/21 1219 01/09/21 1606  GLUCAP 95 121* 59* 99 107*       Signed:  Oswald Hillock MD.  [facility] 01/09/2021, 4:13 PM

## 2021-01-09 NOTE — Progress Notes (Addendum)
Spoke to [employer] at [employer]. Clinic is aware of pt's needs for iv abx at d/c. Will notify clinic once d/c date is known.   Melven Sartorius [facility] [place] [phone]  Addendum at 4:23 pm: Spoke to [place] at [facility] regarding pt's d/c today and pt to resume care on Saturday. D/C summary faxed to clinic for continuation of care. Clinic aware pt to need iv vanc with treatments.

## 2021-01-09 NOTE — Progress Notes (Signed)
Pt off unit to hemo

## 2021-01-09 NOTE — Progress Notes (Signed)
[facility]  PROGRESS NOTE  [name]:[id number] DOB: [birth year] DOA: 12/24/2020 PCP: Tina Corrigan, MD   Brief HPI:   70 year old female with a history of ESRD on HD TTS, HIV, atrial flutter on Eliquis, AV fistula occlusion 4 weeks ago requiring balloon angioplasty and conversion of right forearm fistula to right upper extremity fistula presented with right upper extremity fistula infection and was transferred from [place].  Vascular surgery, nephrology, IR and ID were consulted.  She underwent ligation of AV fistula and incision and drainage on 12/27/2020.  She had tunneled catheter placement on 12/30/2020.  She returned to the OR on 12/31/2020 by vascular surgery for washout and wound VAC placement.  Wound VAC was changed on 01/03/2021.  She underwent placement of skin substitute as per vascular surgery on 01/07/2021.    Subjective   Patient seen and examined, no new complaints.   Assessment/Plan:   Right AV fistula infection -S/p incision and drainage, ligation of AV fistula on 10/28 S/p OR on 12/31/2020 for washout and wound VAC placement -Underwent skin s substitute placement on 01/07/2021 -ID recommends IV vancomycin with dialysis for total 6 weeks, last day 02/12/2021 -Wound VAC as per vascular surgery -Vascular surgery will follow up as outpatient  ESRD on hemodialysis -Nephrology following  Atrial flutter -Continue metoprolol -Continue apixaban for anticoagulation  HIV -CD4 252 -Continue antiretroviral therapy  Anemia of chronic disease -Hemoglobin is stable  History of chronic pain syndrome -On fentanyl patch, methadone -Outpatient follow-up with pain management  Severe protein calorie malnutrition -Encourage oral intake           Scheduled medications:    apixaban  2.5 mg Oral BID   calcitRIOL  1.25 mcg Oral Q T,Th,Sa-HD   Chlorhexidine Gluconate Cloth  6 each Topical Q0600   Chlorhexidine Gluconate Cloth  6 each  Topical Q0600   cinacalcet  60 mg Oral Q T,Th,Sa-HD   darbepoetin (ARANESP) injection - DIALYSIS  200 mcg Intravenous Q Thu-HD   dolutegravir  50 mg Oral q1600   fentaNYL  1 patch Transdermal I78M   folic acid  1 mg Oral Daily   lamiVUDine  50 mg Oral Daily   methadone  2.5 mg Oral Q2000   metoprolol tartrate  50 mg Oral BID   mirtazapine  15 mg Oral QHS   multivitamin  1 tablet Oral QHS   pantoprazole  40 mg Oral QAC breakfast   vancomycin  500 mg Intravenous Q T,Th,Sa-HD   venlafaxine XR  150 mg Oral Q breakfast   zidovudine  300 mg Oral Daily     Data Reviewed:   CBG:  Recent Labs  Lab 01/09/21 0003 01/09/21 0105 01/09/21 0412 01/09/21 1159 01/09/21 1219  GLUCAP 59* 95 121* 59* 99    SpO2: 98 % O2 Flow Rate (L/min): 2 L/min    Vitals:   01/09/21 1000 01/09/21 1030 01/09/21 1100 01/09/21 1203  BP: (!) 143/86 (!) 159/82 (!) 158/87 (!) 147/82  Pulse:    63  Resp:    18  Temp:      TempSrc:      SpO2:    98%  Weight:      Height:         Intake/Output Summary (Last 24 hours) at 01/09/2021 1453 Last data filed at 01/09/2021 1319 Gross per 24 hour  Intake 715 ml  Output 0 ml  Net 715 ml    11/08 1901 - 11/10 0700 In: 715 [P.O.:715] Out: 0  Filed Weights   01/07/21 1454 01/07/21 2016 01/09/21 0731  Weight: 48.9 kg 51.5 kg 52.8 kg    Data Reviewed: Basic Metabolic Panel: Recent Labs  Lab 01/03/21 0503 01/04/21 0436 01/05/21 0635 01/06/21 0346 01/07/21 0542 01/09/21 0320  NA 133* 131* 133* 135 136 135  K 3.7 4.2 4.2 4.4 4.8 4.5  CL 99 97* 99 102 99 100  CO2 27 27 25 26 28 27   GLUCOSE 79 99 76 80 72 92  BUN 12 21 11 23  34* 33*  CREATININE 3.89* 5.57* 3.92* 5.46* 7.06* 6.37*  CALCIUM 8.8* 9.0 8.8* 8.9 8.9 8.5*  MG 2.1 2.0 2.1  --   --   --   PHOS  --   --   --   --   --  5.3*   Liver Function Tests: Recent Labs  Lab 01/09/21 0320  ALBUMIN 2.2*   No results for input(s): LIPASE, AMYLASE in the last 168 hours. No results for  input(s): AMMONIA in the last 168 hours. CBC: Recent Labs  Lab 01/04/21 0436 01/05/21 0635 01/06/21 0346 01/07/21 0542 01/08/21 0440  WBC 4.8 4.3 4.6 4.3 4.6  HGB 9.8* 9.7* 9.0* 9.1* 9.1*  HCT 32.1* 32.0* 29.5* 30.1* 30.5*  MCV 101.6* 101.3* 102.1* 103.1* 104.8*  PLT 174 174 174 192 190   Cardiac Enzymes: No results for input(s): CKTOTAL, CKMB, CKMBINDEX, TROPONINI in the last 168 hours. BNP (last 3 results) No results for input(s): BNP in the last 8760 hours.  ProBNP (last 3 results) No results for input(s): PROBNP in the last 8760 hours.  CBG: Recent Labs  Lab 01/09/21 0003 01/09/21 0105 01/09/21 0412 01/09/21 1159 01/09/21 1219  GLUCAP 59* 95 121* 59* 99       Radiology Reports  No results found.     Antibiotics: Anti-infectives (From admission, onward)    Start     Dose/Rate Route Frequency Ordered Stop   01/09/21 1200  vancomycin (VANCOCIN) IVPB 500 mg/100 ml premix        500 mg 100 mL/hr over 60 Minutes Intravenous Every T-Th-Sa (Hemodialysis) 01/08/21 0821     01/04/21 1200  vancomycin (VANCOREADY) IVPB 500 mg/100 mL  Status:  Discontinued        500 mg 100 mL/hr over 60 Minutes Intravenous Once in dialysis 01/04/21 0915 01/05/21 0759   12/31/20 1200  vancomycin (VANCOCIN) IVPB 500 mg/100 ml premix  Status:  Discontinued        500 mg 100 mL/hr over 60 Minutes Intravenous Every T-Th-Sa (Hemodialysis) 12/28/20 0827 12/30/20 1234   12/31/20 0955  vancomycin variable dose per unstable renal function (pharmacist dosing)  Status:  Discontinued         Does not apply See admin instructions 12/31/20 0955 01/08/21 0825   12/30/20 1128  vancomycin (VANCOCIN) IVPB 1000 mg/200 mL premix        over 60 Minutes Intravenous Continuous PRN 12/30/20 1148 12/30/20 1128   12/30/20 1028  vancomycin (VANCOCIN) 1-5 GM/200ML-% IVPB       Note to Pharmacy: Lytle Butte   : cabinet override      12/30/20 1028 12/30/20 2244   12/30/20 0000  vancomycin (VANCOCIN) IVPB  1000 mg/200 mL premix        1,000 mg 200 mL/hr over 60 Minutes Intravenous To Radiology 12/29/20 1348 12/30/20 0118   12/27/20 1347  vancomycin (VANCOCIN) 1-5 GM/200ML-% IVPB       Note to Pharmacy: Alphonsus Sias   : cabinet override  12/27/20 1347 12/27/20 1420   12/27/20 1345  vancomycin (VANCOCIN) IVPB 1000 mg/200 mL premix        1,000 mg 200 mL/hr over 60 Minutes Intravenous To Surgery 12/27/20 1335 12/27/20 1436   12/27/20 0600  ceFAZolin (ANCEF) IVPB 2g/100 mL premix  Status:  Discontinued        2 g 200 mL/hr over 30 Minutes Intravenous To Radiology 12/26/20 1016 12/26/20 1055   12/26/20 1630  vancomycin (VANCOCIN) IVPB 500 mg/100 ml premix        500 mg 100 mL/hr over 60 Minutes Intravenous  Once 12/26/20 1455 12/26/20 1839   12/26/20 1345  lamiVUDine (EPIVIR) 10 MG/ML solution 50 mg        50 mg Oral Daily 12/26/20 1254     12/26/20 1115  zidovudine (RETROVIR) capsule 300 mg        300 mg Oral Daily 12/26/20 1023     12/25/20 2000  ceFEPIme (MAXIPIME) 1 g in sodium chloride 0.9 % 100 mL IVPB  Status:  Discontinued        1 g 200 mL/hr over 30 Minutes Intravenous Every 24 hours 12/25/20 1142 12/29/20 1002   12/25/20 1600  dolutegravir (TIVICAY) tablet 50 mg        50 mg Oral Daily-1600 12/25/20 0918     12/25/20 1318  vancomycin variable dose per unstable renal function (pharmacist dosing)  Status:  Discontinued         Does not apply See admin instructions 12/25/20 1319 12/28/20 0827   12/25/20 0845  vancomycin (VANCOCIN) IVPB 1000 mg/200 mL premix  Status:  Discontinued        1,000 mg 200 mL/hr over 60 Minutes Intravenous  Once 12/25/20 0833 12/25/20 0842   12/25/20 0845  ceFEPIme (MAXIPIME) 2 g in sodium chloride 0.9 % 100 mL IVPB  Status:  Discontinued        2 g 200 mL/hr over 30 Minutes Intravenous  Once 12/25/20 2831 12/25/20 0843   12/24/20 1945  zidovudine (RETROVIR) capsule 100 mg  Status:  Discontinued       Note to Pharmacy: (0900, 1300, & 2100)     100 mg  Oral 3 times daily 12/24/20 1827 12/26/20 1023   12/24/20 1830  vancomycin (VANCOCIN) IVPB 1000 mg/200 mL premix        1,000 mg 200 mL/hr over 60 Minutes Intravenous  Once 12/24/20 1719 12/24/20 1905   12/24/20 1830  lamivudine (EPIVIR) tablet 50 mg  Status:  Discontinued       Note to Pharmacy: (0800)     50 mg Oral Daily 12/24/20 1827 12/26/20 1254   12/24/20 1800  ceFEPIme (MAXIPIME) 2 g in sodium chloride 0.9 % 100 mL IVPB        2 g 200 mL/hr over 30 Minutes Intravenous  Once 12/24/20 1718 12/24/20 1800         DVT prophylaxis: Eliquis  Code Status: Full code  Family Communication: No family at bedside   Consultants: Vascular surgery Nephrology IR Infectious disease Palliative care  Procedures:     Objective    Physical Examination:  General-appears in no acute distress Heart-S1-S2, regular, no murmur auscultated Lungs-clear to auscultation bilaterally, no wheezing or crackles auscultated Abdomen-soft, nontender, no organomegaly Extremities-no edema in the lower extremities Neuro-alert, oriented x3, no focal deficit noted  Status is: Inpatient  Dispo: The patient is from: Home              Anticipated d/c is  to: Home              Anticipated d/c date is: 01/10/2021              Patient currently not stable for discharge  Barrier to discharge-as per vascular surgery  COVID-19 Labs  No results for input(s): DDIMER, FERRITIN, LDH, CRP in the last 72 hours.  Lab Results  Component Value Date   [place] NEGATIVE 12/24/2020   [place] NEGATIVE 11/26/2020   [place] NEGATIVE 11/15/2020   [place] NEGATIVE 11/12/2020            No results found for this or any previous visit (from the past 240 hour(s)).  Oswald Hillock   [facility] If 7PM-7AM, please contact night-coverage at [email or web address], Office  [phone]   01/09/2021, 2:53 PM  LOS: 16 days

## 2021-01-09 NOTE — Progress Notes (Signed)
Hypoglycemic Event  CBG: 59  Treatment: 4 oz juice/soda  Symptoms: None  Follow-up CBG: Time: 01:05 CBG Result: 95  Possible Reasons for Event: Unknown  Comments/MD notified:per hypoglycemia protocol    Danika Kluender Joselita

## 2021-01-09 NOTE — Progress Notes (Signed)
Nephrology Progress Note:   Patient ID: [name], female   DOB: 1950/09/20, 70 y.o.   MRN: [id number]  S: Seen and examined on dialysis.  Procedure supervised.  Blood pressure  131/91 HR 62 on HD.  Left IJ tunn catheter in use.  Tolerating goal.  She was weighed this am with an extra blanket as well as with her wound vac (likely).  They're going to get a weight after HD with the vac off for [place].   Review of systems:   Denies shortness of breath or chest pain Denies n/v   O:BP (!) 131/91   Pulse (!) 59   Temp 97.7 F (36.5 C) (Temporal)   Resp 11   Ht 5' 2.5" (1.588 m)   Wt 52.8 kg   LMP  (LMP Unknown)   SpO2 100%   BMI 20.95 kg/m   Intake/Output Summary (Last 24 hours) at 01/09/2021 0908 Last data filed at 01/09/2021 0323 Gross per 24 hour  Intake 595 ml  Output 0 ml  Net 595 ml   Intake/Output: I/O last 3 completed shifts: In: 715 [P.O.:715] Out: 0   Intake/Output this shift:  No intake/output data recorded. Weight change:    Physical exam:   General adult female in bed in no acute distress HEENT normocephalic atraumatic extraocular movements intact sclera anicteric Neck supple trachea midline Lungs clear to auscultation bilaterally normal work of breathing at rest  Heart S1S2 no rub Abdomen soft nontender nondistended Extremities no edema RUE with wound VAC in place Psych normal mood and affect Access: left IJ tunn catheter in place  Recent Labs  Lab 01/03/21 0503 01/04/21 0436 01/05/21 0635 01/06/21 0346 01/07/21 0542 01/09/21 0320  NA 133* 131* 133* 135 136 135  K 3.7 4.2 4.2 4.4 4.8 4.5  CL 99 97* 99 102 99 100  CO2 27 27 25 26 28 27   GLUCOSE 79 99 76 80 72 92  BUN 12 21 11 23  34* 33*  CREATININE 3.89* 5.57* 3.92* 5.46* 7.06* 6.37*  ALBUMIN  --   --   --   --   --  2.2*  CALCIUM 8.8* 9.0 8.8* 8.9 8.9 8.5*  PHOS  --   --   --   --   --  5.3*    CBC: Recent Labs  Lab 01/04/21 0436 01/05/21 0635 01/06/21 0346 01/07/21 0542  01/08/21 0440  WBC 4.8 4.3 4.6 4.3 4.6  HGB 9.8* 9.7* 9.0* 9.1* 9.1*  HCT 32.1* 32.0* 29.5* 30.1* 30.5*  MCV 101.6* 101.3* 102.1* 103.1* 104.8*  PLT 174 174 174 192 190   CBG: Recent Labs  Lab 01/08/21 1707 01/08/21 1958 01/09/21 0003 01/09/21 0105 01/09/21 0412  GLUCAP 77 98 59* 95 121*    Iron Studies: No results for input(s): IRON, TIBC, TRANSFERRIN, FERRITIN in the last 72 hours. Studies/Results: No results found.  apixaban  2.5 mg Oral BID   calcitRIOL  1.25 mcg Oral Q T,Th,Sa-HD   Chlorhexidine Gluconate Cloth  6 each Topical Q0600   Chlorhexidine Gluconate Cloth  6 each Topical Q0600   cinacalcet  60 mg Oral Q T,Th,Sa-HD   darbepoetin (ARANESP) injection - DIALYSIS  200 mcg Intravenous Q Thu-HD   dolutegravir  50 mg Oral q1600   fentaNYL  1 patch Transdermal W09W   folic acid  1 mg Oral Daily   lamiVUDine  50 mg Oral Daily   methadone  2.5 mg Oral Q2000   metoprolol tartrate  50 mg Oral BID  mirtazapine  15 mg Oral QHS   multivitamin  1 tablet Oral QHS   pantoprazole  40 mg Oral QAC breakfast   vancomycin  500 mg Intravenous Q T,Th,Sa-HD   venlafaxine XR  150 mg Oral Q breakfast   zidovudine  300 mg Oral Daily    BMET    Component Value Date/Time   NA 135 01/09/2021 0320   K 4.5 01/09/2021 0320   CL 100 01/09/2021 0320   CO2 27 01/09/2021 0320   GLUCOSE 92 01/09/2021 0320   BUN 33 (H) 01/09/2021 0320   CREATININE 6.37 (H) 01/09/2021 0320   CREATININE 5.66 (H) 12/22/2019 1117   CALCIUM 8.5 (L) 01/09/2021 0320   CALCIUM 9.0 03/12/2011 0527   GFRNONAA 7 (L) 01/09/2021 0320   GFRNONAA 5 (L) 02/12/2015 1000   GFRAA 9 (L) 10/22/2019 0105   GFRAA 5 (L) 02/12/2015 1000   CBC    Component Value Date/Time   WBC 4.6 01/08/2021 0440   RBC 2.91 (L) 01/08/2021 0440   HGB 9.1 (L) 01/08/2021 0440   HCT 30.5 (L) 01/08/2021 0440   PLT 190 01/08/2021 0440   MCV 104.8 (H) 01/08/2021 0440   MCH 31.3 01/08/2021 0440   MCHC 29.8 (L) 01/08/2021 0440   RDW 19.8  (H) 01/08/2021 0440   LYMPHSABS 1.1 12/25/2020 0409   MONOABS 0.7 12/25/2020 0409   EOSABS 0.1 12/25/2020 0409   BASOSABS 0.0 12/25/2020 0409    Dialysis Orders: Center: [facility]  on TTS . EDW 41.5kg HD Bath 2K/2.5Ca  Time 3 hours Heparin 1000 units bolus then 500 units/hr. 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  appreciate vascular surgery evaluation-  s/p ligation and debridement 10/28.  Now with wound VAC in place.  On vanc until December 14. MRSA on culture.  Note nephrology communicated earlier to her dialysis unit that she will need vancomycin until December 14  ESRD -  HD per her usual TTS schedule.   When discharged will need abx with HD as above - vanc until 12/14 Note they are awaiting CIR so she may be here a while longer Weights are up - unsure if accurate as may have been weighed with wound vac. Optimize volume with HD  Hypertension/volume  - UF as able.  She has prespecified max of 3.5 liters.  Home med metoprolol and note also this is for arrhythmia   Anemia CKD - Continue aranesp - is ordered 200 mcg weekly on Thursdays  Metabolic bone disease -  continue with calcitriol and sensipar AFib/FL:  improved with increased metoprolol dose per charting HIV - regimen per primary team   Disposition per primary team - per discussion on 11/9 she is awaiting CIR  Claudia Desanctis, MD 01/09/2021 9:20 AM

## 2021-01-09 NOTE — Progress Notes (Signed)
Hypoglycemic Event  CBG: 59  Treatment: 4 oz juice/soda  Symptoms: None  Follow-up CBG: Time:12:18 CBG Result:99  Possible Reasons for Event: Inadequate meal intake  Comments/MD notified:hypoglycemic protocol followed    Senan Urey S Andres Bantz

## 2021-01-09 NOTE — Progress Notes (Signed)
OT Cancellation Note  Patient Details Name: MUNA DEMERS MRN: [id number] DOB: 1950-12-22   Cancelled Treatment:    Reason Eval/Treat Not Completed: Patient at procedure or test/ unavailable;Patient declined, no reason specified (patient approached after returning to HD and patient stated she wanted to rest and asked not to participate.) Lodema Hong, [place]  Pager [phone] Office [place] 01/09/2021, 3:40 PM

## 2021-01-10 NOTE — Anesthesia Postprocedure Evaluation (Signed)
Anesthesia Post Note  Patient: [name]  Procedure(s) Performed: RIGHT ARM INCISION AND DRAINAGE (Right) MYRIAD SKIN GRAFT PLACEMENT (Right) APPLICATION OF WOUND VAC (Right)     Patient location during evaluation: PACU Anesthesia Type: General Level of consciousness: patient cooperative and awake Pain management: pain level controlled Vital Signs Assessment: post-procedure vital signs reviewed and stable Respiratory status: spontaneous breathing, nonlabored ventilation, respiratory function stable and patient connected to nasal cannula oxygen Cardiovascular status: blood pressure returned to baseline and stable Postop Assessment: no apparent nausea or vomiting Anesthetic complications: no   No notable events documented.  Last Vitals:  Vitals:   01/09/21 1608 01/09/21 2032  BP: 128/82 140/88  Pulse: (!) 54 (!) 58  Resp: 17 18  Temp: 36.7 C 37.1 C  SpO2: 100% 100%    Last Pain:  Vitals:   01/09/21 2032  TempSrc: Oral  PainSc:                        

## 2021-01-15 ENCOUNTER — Ambulatory Visit (INDEPENDENT_AMBULATORY_CARE_PROVIDER_SITE_OTHER): Payer: Medicare Other | Admitting: Vascular Surgery

## 2021-01-15 ENCOUNTER — Other Ambulatory Visit: Payer: Self-pay

## 2021-01-15 ENCOUNTER — Ambulatory Visit (HOSPITAL_COMMUNITY): Payer: Medicare Other

## 2021-01-15 ENCOUNTER — Encounter: Payer: Self-pay | Admitting: Vascular Surgery

## 2021-01-15 VITALS — BP 80/50 | Temp 98.8°F | Resp 20 | Ht 62.5 in | Wt 116.0 lb

## 2021-01-15 DIAGNOSIS — S41101D Unspecified open wound of right upper arm, subsequent encounter: Secondary | ICD-10-CM

## 2021-01-15 DIAGNOSIS — Z992 Dependence on renal dialysis: Secondary | ICD-10-CM

## 2021-01-15 DIAGNOSIS — N186 End stage renal disease: Secondary | ICD-10-CM

## 2021-01-15 NOTE — Progress Notes (Signed)
Subjective:     Patient ID: [name], female   DOB: 1950/08/18, 70 y.o.   MRN: [id number]  HPI 70 year old female recent underwent debridement of right arm for 2 wounds.  A wound VAC was placed as well as myriad.  She now follows up for wound VAC change.   Review of Systems Right arm pain    Objective:   Physical Exam Vitals:   01/15/21 0852  BP: (!) 80/50  Resp: 20  Temp: 98.8 F (37.1 C)  SpO2: 94%   Awake alert oriented Nonlabored aspirations Palpable right radial pulse      Assessment:     70 year old female with recent placement of myriad in her right upper extremity wounds.  Unfortunately she did not have a wound VAC today we will need to place wet-to-dry dressings I have ordered a wound VAC for her nursing home I am not sure if this will be able to be placed or not.  If not she can have wet-to-dry dressings daily.   Plan:     Follow-up in 1 week for repeat wound change.    Srija Southard C. Donzetta Matters, MD [facility] Office: [phone] Pager: [phone]

## 2021-01-22 ENCOUNTER — Ambulatory Visit (INDEPENDENT_AMBULATORY_CARE_PROVIDER_SITE_OTHER): Payer: Medicare Other | Admitting: Physician Assistant

## 2021-01-22 ENCOUNTER — Other Ambulatory Visit: Payer: Self-pay

## 2021-01-22 VITALS — BP 223/135 | HR 62 | Temp 97.9°F | Resp 20 | Ht 62.5 in | Wt 98.3 lb

## 2021-01-22 DIAGNOSIS — Z992 Dependence on renal dialysis: Secondary | ICD-10-CM

## 2021-01-22 DIAGNOSIS — N186 End stage renal disease: Secondary | ICD-10-CM

## 2021-01-22 NOTE — Progress Notes (Signed)
    Postoperative Access Visit   History of Present Illness   [name] is a 70 y.o. year old female who presents for postoperative follow-up status post ligation and excision of right forearm fistula.  She has had open wounds in these areas without adequate tissue coverage.  She returned to the operating room for myriad matrix skin substitute on 01/07/2021 by Dr. Donzetta Matters.  She is currently residing in a nursing facility and states they are changing her dressing daily.  She denies any fevers, chills, nausea/vomiting or any bleeding.  She is dialyzing via left IJ [facility]  Physical Examination   Vitals:   01/22/21 0832  BP: (!) 223/135  Pulse: 62  Resp: 20  Temp: 97.9 F (36.6 C)  SpO2: 99%  Weight: 98 lb 5.2 oz (44.6 kg)  Height: 5' 2.5" (1.588 m)   Body mass index is 17.7 kg/m.  right arm Green matrix gauze left in place however edges of wound appear to have granulation tissue; she has a palpable radial pulse; no signs of infection currently    Medical Decision Making   [name] is a 70 y.o. year old female who presents s/p myriad skin substitute of right forearm  Right arm wounds with green myriad gauze in place.  From what I can tell by lifting up the edges, there seems to be some granulation tissue.  Continue daily dressing changes with lubricant jelly followed by gauze and a Kerlix wrap.  She will follow-up in 2 weeks for another wound check.  We will involve Dr. Donzetta Matters in this appointment.   Dagoberto Ligas PA-C [facility] [place] Office: [phone]  Clinic MD: Scot Dock

## 2021-01-23 ENCOUNTER — Inpatient Hospital Stay (HOSPITAL_COMMUNITY)
Admission: EM | Admit: 2021-01-23 | Discharge: 2021-02-10 | DRG: 969 | Disposition: A | Discharge status: Skilled Nursing Facility | Payer: Medicare Other | Attending: Internal Medicine | Admitting: Internal Medicine

## 2021-01-23 ENCOUNTER — Emergency Department (HOSPITAL_COMMUNITY): Payer: Medicare Other

## 2021-01-23 ENCOUNTER — Encounter (HOSPITAL_COMMUNITY): Payer: Self-pay | Admitting: Radiology

## 2021-01-23 ENCOUNTER — Other Ambulatory Visit: Payer: Self-pay

## 2021-01-23 DIAGNOSIS — G9340 Encephalopathy, unspecified: Secondary | ICD-10-CM | POA: Diagnosis not present

## 2021-01-23 DIAGNOSIS — Z515 Encounter for palliative care: Secondary | ICD-10-CM | POA: Diagnosis not present

## 2021-01-23 DIAGNOSIS — K219 Gastro-esophageal reflux disease without esophagitis: Secondary | ICD-10-CM | POA: Diagnosis present

## 2021-01-23 DIAGNOSIS — I161 Hypertensive emergency: Secondary | ICD-10-CM | POA: Diagnosis present

## 2021-01-23 DIAGNOSIS — I10 Essential (primary) hypertension: Secondary | ICD-10-CM | POA: Diagnosis present

## 2021-01-23 DIAGNOSIS — R569 Unspecified convulsions: Secondary | ICD-10-CM

## 2021-01-23 DIAGNOSIS — E039 Hypothyroidism, unspecified: Secondary | ICD-10-CM | POA: Diagnosis present

## 2021-01-23 DIAGNOSIS — E871 Hypo-osmolality and hyponatremia: Secondary | ICD-10-CM

## 2021-01-23 DIAGNOSIS — D631 Anemia in chronic kidney disease: Secondary | ICD-10-CM | POA: Diagnosis present

## 2021-01-23 DIAGNOSIS — E0590 Thyrotoxicosis, unspecified without thyrotoxic crisis or storm: Secondary | ICD-10-CM | POA: Diagnosis present

## 2021-01-23 DIAGNOSIS — E43 Unspecified severe protein-calorie malnutrition: Secondary | ICD-10-CM | POA: Diagnosis present

## 2021-01-23 DIAGNOSIS — Z4659 Encounter for fitting and adjustment of other gastrointestinal appliance and device: Secondary | ICD-10-CM

## 2021-01-23 DIAGNOSIS — R509 Fever, unspecified: Secondary | ICD-10-CM | POA: Diagnosis not present

## 2021-01-23 DIAGNOSIS — I482 Chronic atrial fibrillation, unspecified: Secondary | ICD-10-CM | POA: Diagnosis not present

## 2021-01-23 DIAGNOSIS — Z91199 Patient's noncompliance with other medical treatment and regimen due to unspecified reason: Secondary | ICD-10-CM

## 2021-01-23 DIAGNOSIS — Z880 Allergy status to penicillin: Secondary | ICD-10-CM

## 2021-01-23 DIAGNOSIS — A4902 Methicillin resistant Staphylococcus aureus infection, unspecified site: Secondary | ICD-10-CM | POA: Diagnosis not present

## 2021-01-23 DIAGNOSIS — F0390 Unspecified dementia without behavioral disturbance: Secondary | ICD-10-CM | POA: Diagnosis present

## 2021-01-23 DIAGNOSIS — I721 Aneurysm of artery of upper extremity: Secondary | ICD-10-CM | POA: Diagnosis not present

## 2021-01-23 DIAGNOSIS — R55 Syncope and collapse: Secondary | ICD-10-CM | POA: Diagnosis not present

## 2021-01-23 DIAGNOSIS — I6783 Posterior reversible encephalopathy syndrome: Principal | ICD-10-CM

## 2021-01-23 DIAGNOSIS — G40901 Epilepsy, unspecified, not intractable, with status epilepticus: Secondary | ICD-10-CM | POA: Diagnosis present

## 2021-01-23 DIAGNOSIS — Z992 Dependence on renal dialysis: Secondary | ICD-10-CM

## 2021-01-23 DIAGNOSIS — I4891 Unspecified atrial fibrillation: Secondary | ICD-10-CM | POA: Diagnosis present

## 2021-01-23 DIAGNOSIS — D62 Acute posthemorrhagic anemia: Secondary | ICD-10-CM | POA: Diagnosis not present

## 2021-01-23 DIAGNOSIS — G8321 Monoplegia of upper limb affecting right dominant side: Secondary | ICD-10-CM | POA: Diagnosis present

## 2021-01-23 DIAGNOSIS — E162 Hypoglycemia, unspecified: Secondary | ICD-10-CM | POA: Diagnosis present

## 2021-01-23 DIAGNOSIS — E875 Hyperkalemia: Secondary | ICD-10-CM | POA: Diagnosis not present

## 2021-01-23 DIAGNOSIS — D539 Nutritional anemia, unspecified: Secondary | ICD-10-CM | POA: Diagnosis present

## 2021-01-23 DIAGNOSIS — E876 Hypokalemia: Secondary | ICD-10-CM | POA: Diagnosis not present

## 2021-01-23 DIAGNOSIS — I272 Pulmonary hypertension, unspecified: Secondary | ICD-10-CM | POA: Diagnosis present

## 2021-01-23 DIAGNOSIS — D696 Thrombocytopenia, unspecified: Secondary | ICD-10-CM

## 2021-01-23 DIAGNOSIS — Z7901 Long term (current) use of anticoagulants: Secondary | ICD-10-CM

## 2021-01-23 DIAGNOSIS — Z66 Do not resuscitate: Secondary | ICD-10-CM | POA: Diagnosis present

## 2021-01-23 DIAGNOSIS — G936 Cerebral edema: Secondary | ICD-10-CM | POA: Diagnosis present

## 2021-01-23 DIAGNOSIS — F32A Depression, unspecified: Secondary | ICD-10-CM | POA: Diagnosis present

## 2021-01-23 DIAGNOSIS — B370 Candidal stomatitis: Secondary | ICD-10-CM | POA: Diagnosis present

## 2021-01-23 DIAGNOSIS — Z7902 Long term (current) use of antithrombotics/antiplatelets: Secondary | ICD-10-CM

## 2021-01-23 DIAGNOSIS — E8809 Other disorders of plasma-protein metabolism, not elsewhere classified: Secondary | ICD-10-CM | POA: Diagnosis present

## 2021-01-23 DIAGNOSIS — I4892 Unspecified atrial flutter: Secondary | ICD-10-CM | POA: Diagnosis present

## 2021-01-23 DIAGNOSIS — R4182 Altered mental status, unspecified: Secondary | ICD-10-CM

## 2021-01-23 DIAGNOSIS — Z8673 Personal history of transient ischemic attack (TIA), and cerebral infarction without residual deficits: Secondary | ICD-10-CM

## 2021-01-23 DIAGNOSIS — I4811 Longstanding persistent atrial fibrillation: Secondary | ICD-10-CM | POA: Diagnosis not present

## 2021-01-23 DIAGNOSIS — Z20822 Contact with and (suspected) exposure to covid-19: Secondary | ICD-10-CM | POA: Diagnosis present

## 2021-01-23 DIAGNOSIS — Z79899 Other long term (current) drug therapy: Secondary | ICD-10-CM

## 2021-01-23 DIAGNOSIS — J9601 Acute respiratory failure with hypoxia: Secondary | ICD-10-CM | POA: Diagnosis not present

## 2021-01-23 DIAGNOSIS — N186 End stage renal disease: Secondary | ICD-10-CM | POA: Diagnosis present

## 2021-01-23 DIAGNOSIS — B20 Human immunodeficiency virus [HIV] disease: Secondary | ICD-10-CM | POA: Diagnosis present

## 2021-01-23 DIAGNOSIS — Z91040 Latex allergy status: Secondary | ICD-10-CM

## 2021-01-23 DIAGNOSIS — F419 Anxiety disorder, unspecified: Secondary | ICD-10-CM | POA: Diagnosis present

## 2021-01-23 DIAGNOSIS — I739 Peripheral vascular disease, unspecified: Secondary | ICD-10-CM | POA: Diagnosis present

## 2021-01-23 DIAGNOSIS — R410 Disorientation, unspecified: Secondary | ICD-10-CM

## 2021-01-23 DIAGNOSIS — Z681 Body mass index (BMI) 19 or less, adult: Secondary | ICD-10-CM

## 2021-01-23 DIAGNOSIS — Z87891 Personal history of nicotine dependence: Secondary | ICD-10-CM

## 2021-01-23 DIAGNOSIS — E7220 Disorder of urea cycle metabolism, unspecified: Secondary | ICD-10-CM | POA: Diagnosis present

## 2021-01-23 DIAGNOSIS — M898X9 Other specified disorders of bone, unspecified site: Secondary | ICD-10-CM | POA: Diagnosis present

## 2021-01-23 DIAGNOSIS — I480 Paroxysmal atrial fibrillation: Secondary | ICD-10-CM | POA: Diagnosis present

## 2021-01-23 DIAGNOSIS — I120 Hypertensive chronic kidney disease with stage 5 chronic kidney disease or end stage renal disease: Secondary | ICD-10-CM | POA: Diagnosis present

## 2021-01-23 DIAGNOSIS — Z7189 Other specified counseling: Secondary | ICD-10-CM | POA: Diagnosis not present

## 2021-01-23 DIAGNOSIS — R0600 Dyspnea, unspecified: Secondary | ICD-10-CM

## 2021-01-23 DIAGNOSIS — R627 Adult failure to thrive: Secondary | ICD-10-CM | POA: Diagnosis present

## 2021-01-23 LAB — CBC WITH DIFFERENTIAL/PLATELET
Abs Immature Granulocytes: 0.01 10*3/uL (ref 0.00–0.07)
Basophils Absolute: 0 10*3/uL (ref 0.0–0.1)
Basophils Relative: 1 %
Eosinophils Absolute: 0 10*3/uL (ref 0.0–0.5)
Eosinophils Relative: 0 %
HCT: 34.4 % — ABNORMAL LOW (ref 36.0–46.0)
Hemoglobin: 10.1 g/dL — ABNORMAL LOW (ref 12.0–15.0)
Immature Granulocytes: 0 %
Lymphocytes Relative: 16 %
Lymphs Abs: 0.5 10*3/uL — ABNORMAL LOW (ref 0.7–4.0)
MCH: 30.6 pg (ref 26.0–34.0)
MCHC: 29.4 g/dL — ABNORMAL LOW (ref 30.0–36.0)
MCV: 104.2 fL — ABNORMAL HIGH (ref 80.0–100.0)
Monocytes Absolute: 0.3 10*3/uL (ref 0.1–1.0)
Monocytes Relative: 8 %
Neutro Abs: 2.4 10*3/uL (ref 1.7–7.7)
Neutrophils Relative %: 75 %
Platelets: 147 10*3/uL — ABNORMAL LOW (ref 150–400)
RBC: 3.3 MIL/uL — ABNORMAL LOW (ref 3.87–5.11)
RDW: 18.2 % — ABNORMAL HIGH (ref 11.5–15.5)
WBC: 3.2 10*3/uL — ABNORMAL LOW (ref 4.0–10.5)
nRBC: 0 % (ref 0.0–0.2)

## 2021-01-23 LAB — TROPONIN I (HIGH SENSITIVITY)
Troponin I (High Sensitivity): 35 ng/L — ABNORMAL HIGH (ref <18)
Troponin I (High Sensitivity): 39 ng/L — ABNORMAL HIGH (ref <18)

## 2021-01-23 LAB — RESP PANEL BY RT-PCR (FLU A&B, COVID) ARPGX2
Influenza A by PCR: NEGATIVE
Influenza B by PCR: NEGATIVE
SARS Coronavirus 2 by RT PCR: NEGATIVE

## 2021-01-23 LAB — COMPREHENSIVE METABOLIC PANEL
ALT: 10 U/L (ref 0–44)
AST: 17 U/L (ref 15–41)
Albumin: 3 g/dL — ABNORMAL LOW (ref 3.5–5.0)
Alkaline Phosphatase: 87 U/L (ref 38–126)
Anion gap: 14 (ref 5–15)
BUN: 29 mg/dL — ABNORMAL HIGH (ref 8–23)
CO2: 24 mmol/L (ref 22–32)
Calcium: 8.5 mg/dL — ABNORMAL LOW (ref 8.9–10.3)
Chloride: 99 mmol/L (ref 98–111)
Creatinine, Ser: 6.8 mg/dL — ABNORMAL HIGH (ref 0.44–1.00)
GFR, Estimated: 6 mL/min — ABNORMAL LOW (ref >60)
Glucose, Bld: 79 mg/dL (ref 70–99)
Potassium: 4.3 mmol/L (ref 3.5–5.1)
Sodium: 137 mmol/L (ref 135–145)
Total Bilirubin: 0.9 mg/dL (ref 0.3–1.2)
Total Protein: 7.5 g/dL (ref 6.5–8.1)

## 2021-01-23 LAB — AMMONIA: Ammonia: 25 umol/L (ref 9–35)

## 2021-01-23 LAB — CBG MONITORING, ED: Glucose-Capillary: 73 mg/dL (ref 70–99)

## 2021-01-23 LAB — TSH: TSH: 4.767 u[IU]/mL — ABNORMAL HIGH (ref 0.350–4.500)

## 2021-01-23 IMAGING — DX DG CHEST 1V PORT
1 series · 1 of 1 positions shown · non-contrast
Comparison: Chest x-ray dated [DATE].

CLINICAL DATA: Syncope.

EXAM:
PORTABLE CHEST 1 VIEW

[chest ap]
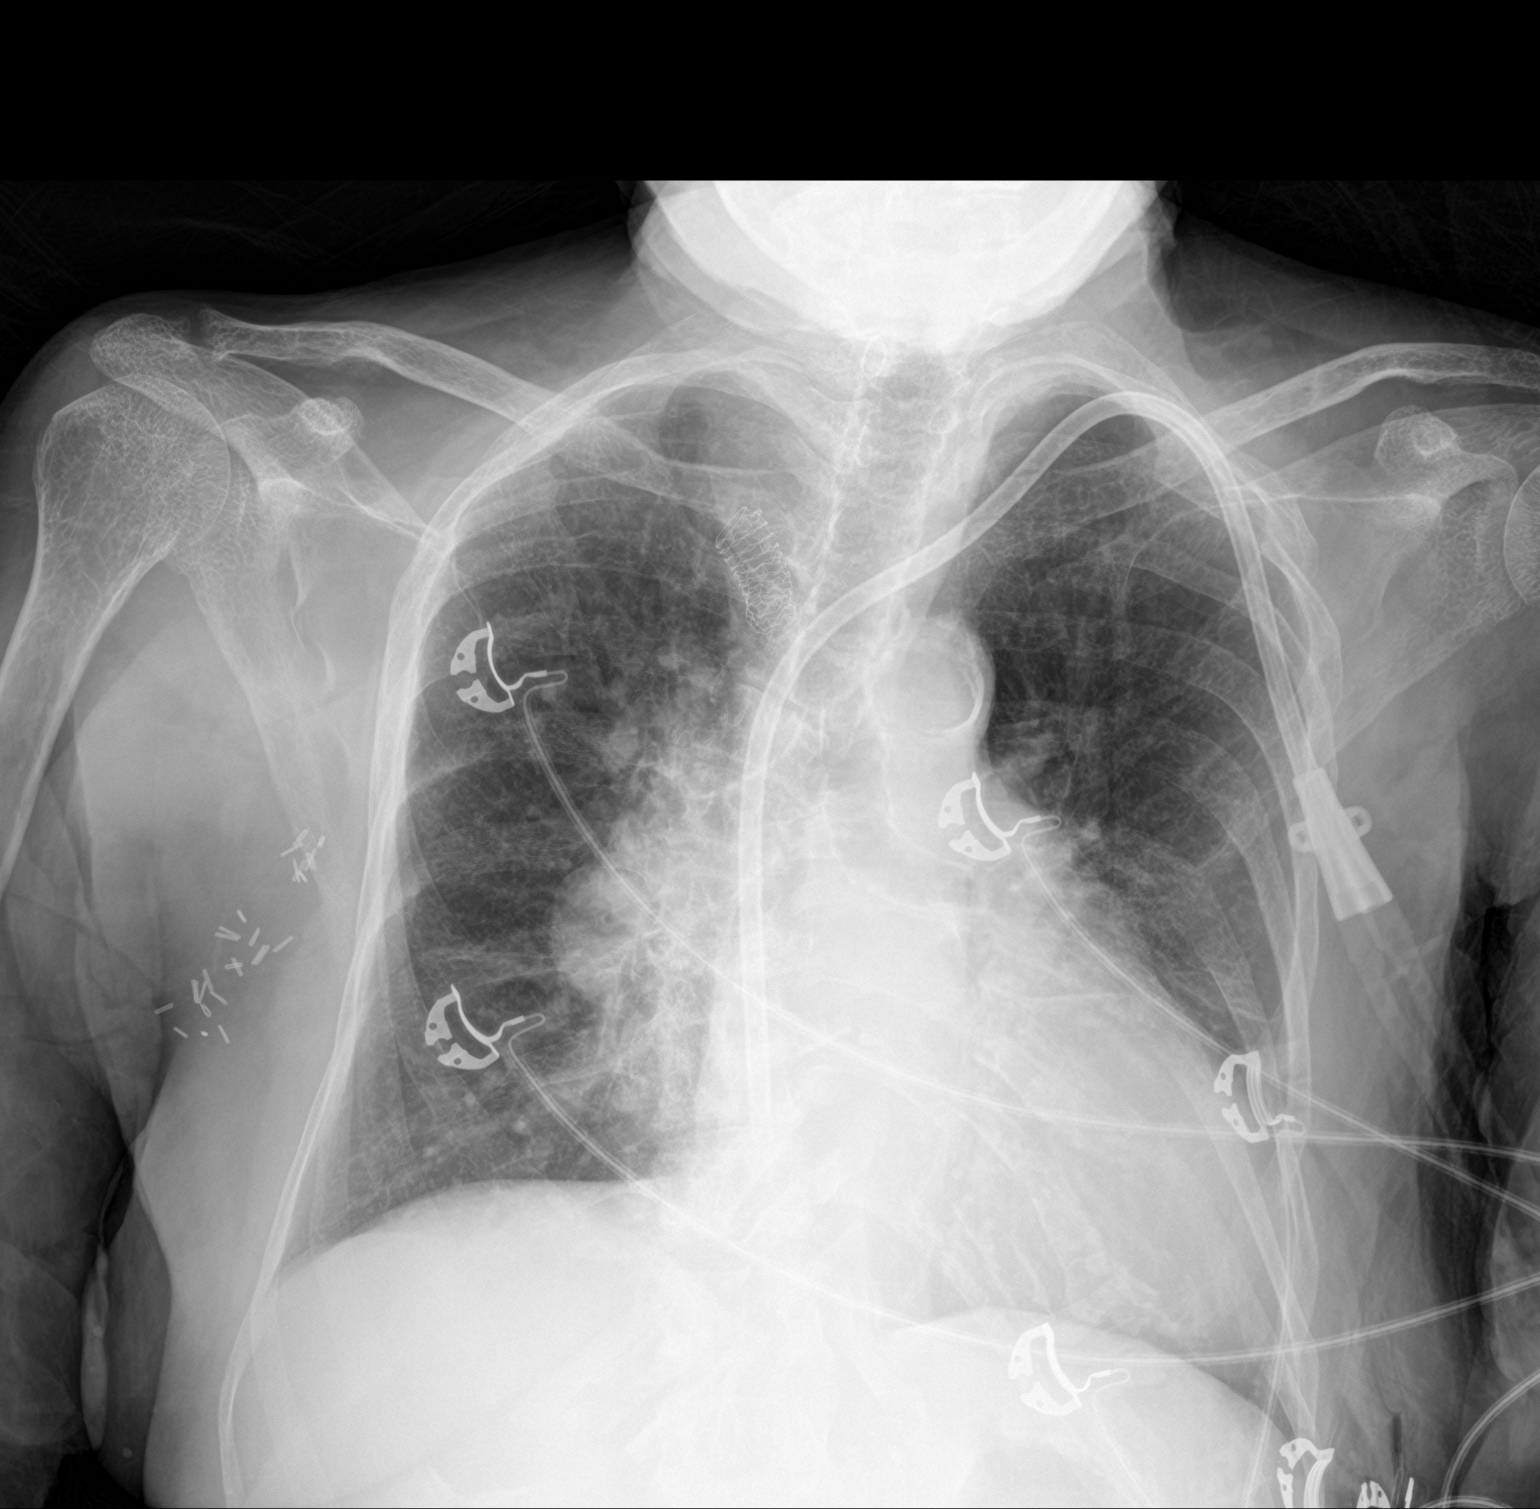

[1 of 1 positions shown; findings below may reference images not displayed]

FINDINGS: Unchanged tunneled left internal jugular dialysis catheter. Stable
cardiomegaly. Unchanged right brachiocephalic vein stent. Unchanged
central pulmonary artery enlargement. No focal consolidation,
pleural effusion, or pneumothorax. No acute osseous abnormality.
IMPRESSION: 1. No active disease.

## 2021-01-23 MED ORDER — LIDOCAINE 4 % EX CREA
1.0000 "application " | TOPICAL_CREAM | Freq: Two times a day (BID) | CUTANEOUS | Status: DC | PRN
  Filled 2021-01-23: qty 5

## 2021-01-23 MED ORDER — NITROGLYCERIN 0.4 MG SL SUBL: 0.4000 mg | SUBLINGUAL_TABLET | SUBLINGUAL | Status: DC | PRN

## 2021-01-23 MED ORDER — ONDANSETRON HCL 4 MG/2ML IJ SOLN
4.0000 mg | Freq: Four times a day (QID) | INTRAMUSCULAR | Status: DC | PRN
  Administered 2021-01-23 – 2021-01-24 (×2): 4 mg via INTRAVENOUS
  Filled 2021-01-23 (×2): qty 2

## 2021-01-23 MED ORDER — LABETALOL HCL 5 MG/ML IV SOLN
10.0000 mg | INTRAVENOUS | Status: DC | PRN
  Administered 2021-01-23 – 2021-01-24 (×2): 10 mg via INTRAVENOUS
  Filled 2021-01-23 (×2): qty 4

## 2021-01-23 MED ORDER — COLLAGENASE 250 UNIT/GM EX OINT
1.0000 "application " | TOPICAL_OINTMENT | Freq: Every day | CUTANEOUS | Status: DC
  Administered 2021-01-24 – 2021-02-02 (×10): 1 via TOPICAL
  Filled 2021-01-23 (×3): qty 30

## 2021-01-23 MED ORDER — VANCOMYCIN HCL IN DEXTROSE 500-5 MG/100ML-% IV SOLN
500.0000 mg | INTRAVENOUS | Status: DC
  Administered 2021-01-26: 07:00:00 500 mg via INTRAVENOUS
  Filled 2021-01-23 (×2): qty 100

## 2021-01-23 MED ORDER — SENNA 8.6 MG PO TABS
2.0000 | ORAL_TABLET | Freq: Every evening | ORAL | Status: DC
  Filled 2021-01-23 (×2): qty 2

## 2021-01-23 MED ORDER — LIDOCAINE-PRILOCAINE 2.5-2.5 % EX CREA
1.0000 "application " | TOPICAL_CREAM | CUTANEOUS | Status: DC
  Filled 2021-01-23: qty 5

## 2021-01-23 MED ORDER — APIXABAN 2.5 MG PO TABS
2.5000 mg | ORAL_TABLET | Freq: Two times a day (BID) | ORAL | Status: DC
  Filled 2021-01-23: qty 1

## 2021-01-23 MED ORDER — LAMIVUDINE 100 MG PO TABS
50.0000 mg | ORAL_TABLET | Freq: Every day | ORAL | Status: DC
  Filled 2021-01-23 (×4): qty 1

## 2021-01-23 MED ORDER — ACETAMINOPHEN 325 MG PO TABS: 650.0000 mg | ORAL_TABLET | Freq: Four times a day (QID) | ORAL | Status: DC | PRN

## 2021-01-23 MED ORDER — METOPROLOL TARTRATE 25 MG PO TABS
25.0000 mg | ORAL_TABLET | Freq: Two times a day (BID) | ORAL | Status: DC
  Filled 2021-01-23: qty 1

## 2021-01-23 MED ORDER — DOLUTEGRAVIR SODIUM 50 MG PO TABS
50.0000 mg | ORAL_TABLET | Freq: Every day | ORAL | Status: DC
  Administered 2021-01-27: 18:00:00 50 mg via ORAL
  Filled 2021-01-23 (×4): qty 1

## 2021-01-23 MED ORDER — ONDANSETRON 4 MG PO TBDP: 8.0000 mg | ORAL_TABLET | Freq: Three times a day (TID) | ORAL | Status: DC | PRN

## 2021-01-23 MED ORDER — ZIDOVUDINE 100 MG PO CAPS
100.0000 mg | ORAL_CAPSULE | Freq: Three times a day (TID) | ORAL | Status: DC
  Filled 2021-01-23 (×13): qty 1

## 2021-01-23 MED ORDER — CYANOCOBALAMIN 1000 MCG/ML IJ SOLN: 1000.0000 ug | INTRAMUSCULAR | Status: DC

## 2021-01-23 MED ORDER — PANTOPRAZOLE SODIUM 40 MG PO TBEC: 40.0000 mg | DELAYED_RELEASE_TABLET | Freq: Every day | ORAL | Status: DC

## 2021-01-23 MED ORDER — THERA-DERM EX LOTN
1.0000 "application " | TOPICAL_LOTION | Freq: Two times a day (BID) | CUTANEOUS | Status: DC | PRN
  Filled 2021-01-23: qty 236

## 2021-01-23 MED ORDER — SODIUM CHLORIDE 0.9 % IV SOLN
250.0000 mL | INTRAVENOUS | Status: DC | PRN
  Administered 2021-01-25: 250 mL via INTRAVENOUS

## 2021-01-23 MED ORDER — SODIUM CHLORIDE 0.9% FLUSH: 3.0000 mL | INTRAVENOUS | Status: DC | PRN

## 2021-01-23 MED ORDER — SODIUM CHLORIDE 0.9% FLUSH
3.0000 mL | Freq: Two times a day (BID) | INTRAVENOUS | Status: DC
  Administered 2021-01-23 – 2021-01-28 (×7): 3 mL via INTRAVENOUS

## 2021-01-23 MED ORDER — ACETAMINOPHEN 650 MG RE SUPP
650.0000 mg | Freq: Four times a day (QID) | RECTAL | Status: DC | PRN
  Administered 2021-01-24: 650 mg via RECTAL
  Filled 2021-01-23: qty 1

## 2021-01-23 MED ORDER — ONDANSETRON HCL 4 MG PO TABS: 4.0000 mg | ORAL_TABLET | Freq: Four times a day (QID) | ORAL | Status: DC | PRN

## 2021-01-23 MED ORDER — CLOPIDOGREL BISULFATE 75 MG PO TABS: 75.0000 mg | ORAL_TABLET | Freq: Every day | ORAL | Status: DC

## 2021-01-23 NOTE — Discharge Instructions (Addendum)
Right leg incision should be washed with soap and water daily.  After cleansing, pat to dry and apply a dry dressing  Right arm wound vac should be changed twice a week.  Last change was performed on 02/10/21.  To change dressing, remove black foam.  DO NOT remove skin graft matrix; it is sutured into place.  Apply k-y jelly to skin graft matrix.  Then apply new piece of black foam.  Patient will follow up in office in 2-3 weeks.

## 2021-01-23 NOTE — ED Notes (Signed)
PIV place in Left hand per MD

## 2021-01-23 NOTE — ED Provider Notes (Signed)
[facility] EMERGENCY DEPARTMENT Provider Note   CSN: [id number] Arrival date & time: 01/23/21  4098     History Chief Complaint  Patient presents with   Loss of Consciousness    [name] is a 70 y.o. female with a history including atrial flutter, end-stage renal disease on dialysis, GERD, HIV, hypertension prior intracranial hemorrhage who lives at [facility], presenting for episode of syncope while receiving dialysis this morning.  She was having complaints of allover body pain prior to the syncopal event, apparently when she woke she had complaints of chest pain and therefore was given a dose of sublingual nitroglycerin prior to transportation here.  She does not recall having chest pain, and denies any symptoms at this time although on first arrival she felt lightheaded and nauseous.  She denies current chest pain, shortness of breath, abdominal pain, she does not feel dizzy, denies headache.  No recent illnesses, flulike symptoms, fevers.  She has had no other treatment prior to arrival.  She was not completed with her dialysis when this event occurred, therefore did not complete her treatment.  She does not urinate daily.   The history is provided by the patient.      Past Medical History:  Diagnosis Date   Anemia    Anxiety    Atrial flutter ([place]) 02/22/2015   C. difficile colitis 10/10/2011   Chronic diarrhea    Chronic pain    Depression    ESRD on hemodialysis [facility])    Dialysis [name]   GERD (gastroesophageal reflux disease)    HIV (human immunodeficiency virus infection) ([place])    Hypertension    Insomnia    Intracranial hemorrhage (HCC)    Muscle weakness (generalized)    Pulmonary HTN ([place])    Tachycardia    TIA (transient ischemic attack)    [place] do not have this dx   Traumatic hematoma of right upper arm 02/22/2015    Patient Active Problem List   Diagnosis Date Noted   Acute encephalopathy 01/23/2021    Hypoglycemia 12/25/2020   Infected fluid collection with fistula 12/24/2020   Dysphagia 12/16/2020   Loss of weight 12/16/2020   Abdominal pain, epigastric 12/16/2020   Protein-calorie malnutrition, severe 11/20/2020   Chest pain of uncertain etiology    Altered mental status    ESRD (end stage renal disease) ([place]) 11/11/2020   Pancreatic abnormality 08/05/2020   Gastritis without bleeding 08/05/2020   Dilated pancreatic duct 05/15/2020   Abnormal MRI of abdomen 05/15/2020   Common bile duct dilation    Lobar pneumonia ([place]) 07/20/2019   Acute respiratory failure with hypoxia ([place]) 07/20/2019   Colitis 07/19/2019   Dilation of common bile duct 07/19/2019   Ventral hernia without obstruction or gangrene    Hypocalcemia 03/07/2019   Other pancytopenia ([place]) 12/08/2018   Anemia of chronic disease 04/17/2016   Hepatitis B immune 01/28/2016   Atrial flutter ([place]) 02/22/2015   Hyperkalemia 02/20/2015   Venous stenosis of right upper extremity 04/06/2014   ESRD on dialysis ([place]) 04/06/2014   Essential hypertension    Nausea and vomiting 12/15/2012   CHF, chronic ([place]) 11/25/2012   Coronary artery disease 09/15/2012   History of stroke 09/15/2012   Anxiety 09/15/2012   Atrial fibrillation ([place]) 05/02/2012   Goiter 04/08/2012   Intracranial bleed ([place]) 12/28/2011   Angioedema of lips 09/21/2011   Cellulitis of breast 03/09/2011   Erosive esophagitis 12/15/2010   Mallory - Mariel Kansky tear 12/15/2010  UGI bleed 12/13/2010   DIARRHEA 04/12/2009   Human immunodeficiency virus (HIV) disease ([place]) 11/15/2008   PANCREATITIS, HX OF 11/15/2008   TOBACCO USE, QUIT 11/15/2008   PAIN IN JOINT, UPPER ARM 08/07/2008   ABDOMINAL WALL HERNIA 04/26/2007   BRONCHITIS, ACUTE 11/16/2006   HYPOKALEMIA, HX OF 07/13/2006   DEPRESSION 03/23/2006   Essential hypertension, malignant 03/23/2006   Gastroesophageal reflux disease 03/23/2006   PANCREATITIS 03/23/2006   MASS, RIGHT AXILLA 03/23/2006    HEADACHE 03/23/2006   SYMPTOM, ENLARGEMENT, LYMPH NODES 03/23/2006   SHINGLES, HX OF 03/23/2006   HYSTERECTOMY, TOTAL, HX OF 03/23/2006    Past Surgical History:  Procedure Laterality Date   A/V FISTULAGRAM N/A 04/17/2016   Procedure: A/V Fistulagram - Right Upper;  Surgeon: Waynetta Sandy, MD;  Location: [place] [facility];  Service: Cardiovascular;  Laterality: N/A;   A/V FISTULAGRAM Right 08/28/2019   Procedure: A/V FISTULAGRAM;  Surgeon: Waynetta Sandy, MD;  Location: [place] [facility];  Service: Cardiovascular;  Laterality: Right;   ABDOMINAL HYSTERECTOMY     APPLICATION OF WOUND VAC Right 01/07/2021   Procedure: APPLICATION OF WOUND VAC;  Surgeon: Waynetta Sandy, MD;  Location: [place];  Service: Vascular;  Laterality: Right;   BIOPSY  01/02/2020   Procedure: BIOPSY;  Surgeon: Eloise Harman, DO;  [facility];  Service: Endoscopy;;   BIOPSY  07/11/2020   Procedure: BIOPSY;  Surgeon: Irving Copas., MD;  Location: [place];  Service: Gastroenterology;;   BIOPSY THYROID     CARPAL TUNNEL RELEASE Right 11/16/2019   Procedure: CARPAL TUNNEL RELEASE;  Surgeon: Leanora Cover, MD;  Location: [place];  Service: Orthopedics;  Laterality: Right;   COLONOSCOPY WITH PROPOFOL N/A 01/02/2020   Surgeon: Eloise Harman, DO;nonbleeding internal hemorrhoids, diverticulosis, status post biopsies of the entire colon (benign). Due for repeat in 2026.   Dialysis Shunts     previous one removed from left arm and now present one in right arm   DIALYSIS/PERMA CATHETER INSERTION Right 04/17/2016   Procedure: dialysis Catheter Insertion central veinous;  Surgeon: Waynetta Sandy, MD;  Location: [place] [facility];  Service: Cardiovascular;  Laterality: Right;   ESOPHAGOGASTRODUODENOSCOPY  12/15/2010   [place]k: erosive reflux esophagitis, MW tear, hiatal hernia (small), gastritis with no h.pylori, possibly nsaid related    ESOPHAGOGASTRODUODENOSCOPY (EGD) WITH PROPOFOL N/A 07/11/2020    Surgeon: Irving Copas., MD; normal esophagus, 4 cm hiatal hernia, gastritis biopsied (mild chronic gastritis, negative H. pylori), single duodenal polyp (Brunner's gland hyperplasia), s/p duodenal biopsy benign, duodenal narrowing at the duodenal sweep.   EUS N/A 07/11/2020    Surgeon: Irving Copas., MD;  dilated CBD and common hepatic duct without stones or sludge in the bile duct, stones and biliary sludge in the gallbladder, pancreatic parenchyma with hyperechoic strands, pancreatic duct dilated with prominent sidebranches in the neck and body and tail, no pancreatic mass though visulalization limited, no ampullary lesion, no malignant appearing lymph node   EXCISION OF BREAST BIOPSY Right 05/04/2012   Procedure: EXCISION OF BREAST BIOPSY;  Surgeon: Donato Heinz, MD;  Location: [facility];  Service: General;  Laterality: Right;  Right Excisional Breast Biopsy   FISTULOGRAM Right 03/26/2014   Procedure: Right Arm Fistulogram with Venoplasty Right Subclavian Vein and Inominate Vein. Debridement Fistula Ulcer;  Surgeon: Conrad Quitman, MD;  Location: [place];  Service: Vascular;  Laterality: Right;   FISTULOGRAM Right 03/29/2017   Procedure: FISTULOGRAM COMPLEX RIGHT ARM with Balloon  angioplasty;  Surgeon: Waynetta Sandy, MD;  Location: [place];  Service: Vascular;  Laterality: Right;   HEMOSTASIS CLIP PLACEMENT  07/11/2020   Procedure: HEMOSTASIS CLIP PLACEMENT;  Surgeon: Irving Copas., MD;  Location: [place];  Service: Gastroenterology;;   INCISION AND DRAINAGE Right 12/27/2020   Procedure: INCISION AND DRAINAGE OF RIGHT FOREARM;  Surgeon: Waynetta Sandy, MD;  Location: [place];  Service: Vascular;  Laterality: Right;   INCISION AND DRAINAGE Right 12/31/2020   Procedure: INCISION AND DRAINAGE RIGHT ARM WITH PLACEMNENT OF WOUND VAC;  Surgeon: Waynetta Sandy, MD;  Location: [place];   Service: Vascular;  Laterality: Right;   INCISION AND DRAINAGE Right 01/07/2021   Procedure: RIGHT ARM INCISION AND DRAINAGE;  Surgeon: Waynetta Sandy, MD;  Location: [place];  Service: Vascular;  Laterality: Right;   IR FLUORO GUIDE CV LINE LEFT  12/26/2020   IR FLUORO GUIDE CV LINE LEFT  12/30/2020   IR GENERIC HISTORICAL  05/19/2016   IR REMOVAL TUN CV CATH W/O FL 05/19/2016 Markus Daft, MD [facility]-INTERV RAD   IR US GUIDE VASC ACCESS LEFT  12/26/2020   IR US GUIDE VASC ACCESS LEFT  12/30/2020   LIGATION OF ARTERIOVENOUS  FISTULA Right 11/12/2020   Procedure: LIGATION OF ARTERIOVENOUS  FISTULA WITH EXCISION OF NECROTIC TISSUE;  Surgeon: Waynetta Sandy, MD;  Location: [place];  Service: Vascular;  Laterality: Right;   LIGATION OF ARTERIOVENOUS  FISTULA Right 12/27/2020   Procedure: LIGATION OF ARTERIOVENOUS  FISTULA;  Surgeon: Waynetta Sandy, MD;  Location: [place];  Service: Vascular;  Laterality: Right;   PERIPHERAL VASCULAR BALLOON ANGIOPLASTY Right 04/17/2016   Procedure: Peripheral Vascular Balloon Angioplasty;  Surgeon: Waynetta Sandy, MD;  Location: [place] [facility];  Service: Cardiovascular;  Laterality: Right;  Central segment AV Fistula   PERIPHERAL VASCULAR BALLOON ANGIOPLASTY Right 03/14/2018   Procedure: PERIPHERAL VASCULAR BALLOON ANGIOPLASTY;  Surgeon: Waynetta Sandy, MD;  Location: [place] [facility];  Service: Cardiovascular;  Laterality: Right;  subclavian vein   PERIPHERAL VASCULAR BALLOON ANGIOPLASTY Right 08/28/2019   Procedure: PERIPHERAL VASCULAR BALLOON ANGIOPLASTY;  Surgeon: Waynetta Sandy, MD;  Location: [place] [facility];  Service: Cardiovascular;  Laterality: Right;  upper arm   PERIPHERAL VASCULAR BALLOON ANGIOPLASTY Right 11/11/2020   Procedure: PERIPHERAL VASCULAR BALLOON ANGIOPLASTY;  Surgeon: Waynetta Sandy, MD;  Location: [place] [facility];  Service: Cardiovascular;  Laterality: Right;    PERIPHERAL VASCULAR INTERVENTION Right 03/14/2018   Procedure: PERIPHERAL VASCULAR INTERVENTION;  Surgeon: Waynetta Sandy, MD;  Location: [place] [facility];  Service: Cardiovascular;  Laterality: Right;  subclavian vein   POLYPECTOMY  07/11/2020   Procedure: POLYPECTOMY;  Surgeon: Mansouraty, Telford Nab., MD;  Location: [facility] ENDOSCOPY;  Service: Gastroenterology;;   REVISON OF ARTERIOVENOUS FISTULA Right 11/12/2020   Procedure: CREATION OF RIGHT ARM ARTERIOVENOUS FISTULA;  Surgeon: Waynetta Sandy, MD;  Location: [place];  Service: Vascular;  Laterality: Right;   SHUNTOGRAM Right 10/19/2013   Procedure: FISTULOGRAM;  Surgeon: Conrad Coats, MD;  Location: [facility] CATH LAB;  Service: Cardiovascular;  Laterality: Right;   SKIN SPLIT GRAFT Right 01/07/2021   Procedure: MYRIAD SKIN GRAFT PLACEMENT;  Surgeon: Waynetta Sandy, MD;  Location: [place];  Service: Vascular;  Laterality: Right;   TONSILLECTOMY     VISCERAL VENOGRAPHY Right 03/14/2018   Procedure: CENTRAL VENOGRAPHY;  Surgeon: Waynetta Sandy, MD;  Location: [place] [facility];  Service: Cardiovascular;  Laterality: Right;  upper ext fistula     OB History     Gravida  1   Para  1   Term  1   Preterm      AB      Living  1      SAB      IAB      Ectopic      Multiple      Live Births              Family History  Problem Relation Age of Onset   Anesthesia problems Neg Hx    Hypotension Neg Hx    Malignant hyperthermia Neg Hx    Pseudochol deficiency Neg Hx    Colon cancer Neg Hx        Unsure of parents who both died when she was a baby    Social History   Tobacco Use   Smoking status: Former    Types: Cigarettes    Quit date: 03/12/1983    Years since quitting: 37.8   Smokeless tobacco: Never  Vaping Use   Vaping Use: Never used  Substance Use Topics   Alcohol use: No    Alcohol/week: 0.0 standard drinks   Drug use: No    Home Medications Prior to Admission  medications   Medication Sig Start Date End Date Taking? Authorizing Provider  Amino Acids-Protein Hydrolys (PRO-STAT 64 PO) Take by mouth. Give 53m by mouth in the morning for wound healing    [provider]  apixaban (ELIQUIS) 2.5 MG TABS tablet Take 1 tablet (2.5 mg total) by mouth 2 (two) times daily. 11/27/20   CUlyses Amor PA-C  baclofen (LIORESAL) 10 MG tablet Take 10 mg by mouth daily as needed for muscle spasms.    [provider]  clopidogrel (PLAVIX) 75 MG tablet Take 1 tablet (75 mg total) by mouth daily with breakfast. 07/14/20   Mansouraty, GTelford Nab, MD  collagenase (SANTYL) ointment Apply 1 application topically daily. Apply to sacrum topically every day shift for wound healing    [provider]  cyanocobalamin (,VITAMIN B-12,) 1000 MCG/ML injection Inject 1,000 mcg into the muscle every 30 (thirty) days. On the 28th of every month    [provider]  dolutegravir (TIVICAY) 50 MG tablet Take 50 mg by mouth daily at 4 PM.    [provider]  Emollient (MINERIN) LOTN Apply 1 application topically every 12 (twelve) hours as needed (dry skin). Apply to feet and ankles    [provider]  fentaNYL (DURAGESIC) 25 MCG/HR Place 1 patch onto the skin every 3 (three) days. 01/09/21   LOswald Hillock MD  lamivudine (EPIVIR) 100 MG tablet Take 50 mg by mouth daily. (0800)    [provider]  Lidocaine HCl 4 % CREA Apply 1 application topically every 12 (twelve) hours as needed (pain).    [provider]  lidocaine-prilocaine (EMLA) cream Apply 1 application topically See admin instructions. (0900) Apply to fistula site one hour before dialysis on Tuesday, Thursday, and Saturday.    [provider]  melatonin 3 MG TABS tablet Take 3 mg by mouth at bedtime. (2000)    [provider]  methadone (DOLOPHINE) 5 MG tablet Take 0.5 tablets (2.5 mg total) by mouth daily at 8 pm. (2000) 01/09/21   LOswald Hillock  MD  metoprolol tartrate (LOPRESSOR) 25 MG tablet Take 1 tablet (25 mg total) by mouth 2 (two) times daily. 11/27/20   CTheda Sers  Susette Racer, PA-C  mirtazapine (REMERON) 15 MG tablet Take 15 mg by mouth at bedtime. 12/05/20   [provider]  Multiple Vitamin (MULTIVITAMIN WITH MINERALS) TABS tablet Take 1 tablet by mouth at bedtime. (2000)    [provider]  nitroGLYCERIN (NITROSTAT) 0.4 MG SL tablet Place 0.4 mg under the tongue every 5 (five) minutes x 3 doses as needed for chest pain.     [provider]  ondansetron (ZOFRAN ODT) 8 MG disintegrating tablet Take 1 tablet (8 mg total) by mouth every 8 (eight) hours as needed for nausea or vomiting. 12/16/20   Erenest Rasher, PA-C  Oxycodone HCl 10 MG TABS Take 1 tablet (10 mg total) by mouth every 12 (twelve) hours as needed (severe pain). 01/09/21   Oswald Hillock, MD  pantoprazole (PROTONIX) 40 MG tablet Take 1 tablet (40 mg total) by mouth daily before breakfast. 12/16/20   Erenest Rasher, PA-C  promethazine (PHENERGAN) 12.5 MG tablet Take 12.5 mg by mouth See admin instructions. Take 1 tablet (12.5 mg) by mouth in the morning every Tuesday, Thursday and Saturday for nausea/vomiting and every 6 hours as needed for n/v    [provider]  senna (SENOKOT) 8.6 MG tablet Take 2 tablets by mouth every evening. (2100)    [provider]  vancomycin (VANCOCIN) 500-5 MG/100ML-% IVPB Inject 100 mLs (500 mg total) into the vein Every Tuesday,Thursday,and Saturday with dialysis. 01/11/21 02/12/21  Oswald Hillock, MD  venlafaxine XR (EFFEXOR-XR) 150 MG 24 hr capsule Take 150 mg by mouth daily with breakfast. (0900)    [provider]  vitamin C (ASCORBIC ACID) 500 MG tablet Take 500 mg by mouth 2 (two) times daily.    [provider]  zidovudine (RETROVIR) 100 MG capsule Take 100 mg by mouth 3 (three) times daily. (0900, 1300, & 2100)    [provider]  Zinc Sulfate (ZINC-220 PO) Take 1  tablet by mouth daily.    [provider]    Allergies    Lactose intolerance (gi), Latex, and Penicillins  Review of Systems   Review of Systems  Constitutional:  Negative for chills and fever.  HENT:  Negative for congestion and sore throat.   Eyes: Negative.   Respiratory:  Negative for cough, chest tightness, shortness of breath and wheezing.   Cardiovascular:  Positive for chest pain.  Gastrointestinal:  Negative for abdominal pain and nausea.  Genitourinary: Negative.   Musculoskeletal:  Negative for arthralgias, joint swelling and neck pain.  Skin: Negative.  Negative for rash and wound.  Neurological:  Positive for syncope. Negative for dizziness, weakness, light-headedness, numbness and headaches.  Psychiatric/Behavioral: Negative.    All other systems reviewed and are negative.  Physical Exam Updated Vital Signs BP (!) 142/93   Pulse 64   Temp 98.7 F (37.1 C) (Oral)   Resp 17   LMP  (LMP Unknown)   SpO2 100%   Physical Exam Vitals and nursing note reviewed.  Constitutional:      Appearance: She is well-developed.  HENT:     Head: Normocephalic and atraumatic.  Eyes:     Conjunctiva/sclera: Conjunctivae normal.  Cardiovascular:     Rate and Rhythm: Normal rate and regular rhythm.     Heart sounds: Normal heart sounds.  Pulmonary:     Effort: Pulmonary effort is normal.     Breath sounds: Normal breath sounds. No wheezing.  Abdominal:     General: Bowel sounds are normal.  Palpations: Abdomen is soft.     Tenderness: There is no abdominal tenderness.  Musculoskeletal:        General: Normal range of motion.     Cervical back: Normal range of motion.  Skin:    General: Skin is warm and dry.  Neurological:     General: No focal deficit present.     Mental Status: She is alert.     Cranial Nerves: No cranial nerve deficit.     Motor: No weakness.     Comments: Pt is slow to respond to some questions adequately but ignores other questions  asked completely.  Equal grip strength.  Moves all 4 extremities on exam, no facial droop.    ED Results / Procedures / Treatments   Labs (all labs ordered are listed, but only abnormal results are displayed) Labs Reviewed  CBC WITH DIFFERENTIAL/PLATELET - Abnormal; Notable for the following components:      Result Value   WBC 3.2 (*)    RBC 3.30 (*)    Hemoglobin 10.1 (*)    HCT 34.4 (*)    MCV 104.2 (*)    MCHC 29.4 (*)    RDW 18.2 (*)    Platelets 147 (*)    Lymphs Abs 0.5 (*)    All other components within normal limits  COMPREHENSIVE METABOLIC PANEL - Abnormal; Notable for the following components:   BUN 29 (*)    Creatinine, Ser 6.80 (*)    Calcium 8.5 (*)    Albumin 3.0 (*)    GFR, Estimated 6 (*)    All other components within normal limits  TROPONIN I (HIGH SENSITIVITY) - Abnormal; Notable for the following components:   Troponin I (High Sensitivity) 35 (*)    All other components within normal limits  TROPONIN I (HIGH SENSITIVITY) - Abnormal; Notable for the following components:   Troponin I (High Sensitivity) 39 (*)    All other components within normal limits  RESP PANEL BY RT-PCR (FLU A&B, COVID) ARPGX2  AMMONIA  TSH  CBG MONITORING, ED    EKG EKG Interpretation  Date/Time:  Thursday January 23 2021 09:17:07 EST Ventricular Rate:  69 PR Interval:  94 QRS Duration: 80 QT Interval:  425 QTC Calculation: 456 R Axis:   165 Text Interpretation: Sinus rhythm Atrial premature complexes Short PR interval Probable RVH w/ secondary repol abnormality Repol abnrm suggests ischemia, lateral leads Since last tracing rate slower and ST abnormality is less prominent, and now in sinus rhythm Otherwise no significant change Confirmed by Daleen Bo [phone]) on 01/23/2021 10:24:53 AM  Radiology CT Head Wo Contrast  Result Date: 01/23/2021 CLINICAL DATA:  Mental status changes of unknown cause in a 70 year old female. EXAM: CT HEAD WITHOUT CONTRAST TECHNIQUE:  Contiguous axial images were obtained from the base of the skull through the vertex without intravenous contrast. COMPARISON:  April 07, 2020. FINDINGS: Brain: No evidence of acute infarction, hemorrhage, hydrocephalus, extra-axial collection or mass lesion/mass effect. Signs of atrophy and chronic microvascular ischemic change as before. Vascular: No hyperdense vessel or unexpected calcification. Skull: Normal. Negative for fracture or focal lesion. Signs of renal osteodystrophy. Sinuses/Orbits: Visualized paranasal sinuses and orbits without acute process. Other: None IMPRESSION: No acute intracranial pathology. Signs of atrophy and chronic microvascular ischemic change as before. Signs of renal osteodystrophy. Electronically Signed   By: Zetta Bills M.D.   On: 01/23/2021 10:45   DG Chest Portable 1 View  Result Date: 01/23/2021 CLINICAL DATA:  Syncope. EXAM: PORTABLE CHEST  1 VIEW COMPARISON:  Chest x-ray dated December 24, 2020. FINDINGS: Unchanged tunneled left internal jugular dialysis catheter. Stable cardiomegaly. Unchanged right brachiocephalic vein stent. Unchanged central pulmonary artery enlargement. No focal consolidation, pleural effusion, or pneumothorax. No acute osseous abnormality. IMPRESSION: 1. No active disease. Electronically Signed   By: Titus Dubin M.D.   On: 01/23/2021 10:49    Procedures Procedures   Medications Ordered in ED Medications - No data to display  ED Course  I have reviewed the triage vital signs and the nursing notes.  Pertinent labs & imaging results that were available during my care of the patient were reviewed by me and considered in my medical decision making (see chart for details).    MDM Rules/Calculators/A&P                           At re-exam, pt is confused, she can state she is at the hospital, disoriented to time, cannot recite her birthdate.  She is alert however.  Call placed to Amber, primary RN caregiver at [facility]  who  states pt is always A&O x 3, no hx of dementia or confusion, ambulatory, and was normal when she left for dialysis this am.  Suspect acute delirium of unclear etiology.  Will plan admission.   Pt discussed with Dr. Manuella Ghazi who accepts pt for admission. Final Clinical Impression(s) / ED Diagnoses Final diagnoses:  Syncope, unspecified syncope type  Acute delirium    Rx / DC Orders ED Discharge Orders     None        Landis Martins 01/23/21 1459    Daleen Bo, MD 01/25/21 [phone]

## 2021-01-23 NOTE — H&P (Addendum)
History and Physical    [name] GYF:[id number] DOB: [birth year] DOA: 01/23/2021  PCP: Hilbert Corrigan, MD   Patient coming from: HD center  Chief Complaint: Syncope  HPI: [name] is a 70 y.o. female with medical history significant for ESRD on HD TTS, HIV, atrial flutter on Eliquis, anemia of chronic disease, chronic pain, and recent MRSA infection involving right AV fistula status post washout and currently on IV vancomycin with wound VAC.  She was apparently at dialysis earlier today during which time she experienced loss of consciousness for an unknown period of time.  Apparently about 500 cc of fluid was taken off before the event.  No seizure activity was otherwise noted.  No neurological deficits noted.  She was also complaining of some all over body pain for which she was given some nitroglycerin. She continues to have ongoing confusion that is intermittent.  No significant deficits otherwise noted as far as weakness, loss of sensation, or change in speech.  She has some mild lightheadedness and nausea.  She currently resides at [facility] due to the need for close wound management as well as IV antibiotics with hemodialysis.   ED Course: Vital signs stable and patient afebrile.  Creatinine 6.8 which is near baseline.  CT head with no acute abnormalities and chest x-ray negative for any acute findings.  Troponins 35 and then subsequently 39.  EKG with no acute findings.  Review of Systems: Reviewed as noted above, otherwise negative.  Past Medical History:  Diagnosis Date   Anemia    Anxiety    Atrial flutter ([place]) 02/22/2015   C. difficile colitis 10/10/2011   Chronic diarrhea    Chronic pain    Depression    ESRD on hemodialysis [facility])    Dialysis [name]   GERD (gastroesophageal reflux disease)    HIV (human immunodeficiency virus infection) ([place])    Hypertension    Insomnia    Intracranial hemorrhage (HCC)    Muscle weakness (generalized)     Pulmonary HTN (HCC)    Tachycardia    TIA (transient ischemic attack)    [facility] do not have this dx   Traumatic hematoma of right upper arm 02/22/2015    Past Surgical History:  Procedure Laterality Date   A/V FISTULAGRAM N/A 04/17/2016   Procedure: A/V Fistulagram - Right Upper;  Surgeon: Waynetta Sandy, MD;  Location: [place] [facility];  Service: Cardiovascular;  Laterality: N/A;   A/V FISTULAGRAM Right 08/28/2019   Procedure: A/V FISTULAGRAM;  Surgeon: Waynetta Sandy, MD;  Location: [place] [facility];  Service: Cardiovascular;  Laterality: Right;   ABDOMINAL HYSTERECTOMY     APPLICATION OF WOUND VAC Right 01/07/2021   Procedure: APPLICATION OF WOUND VAC;  Surgeon: Waynetta Sandy, MD;  Location: [place];  Service: Vascular;  Laterality: Right;   BIOPSY  01/02/2020   Procedure: BIOPSY;  Surgeon: Eloise Harman, DO;  [facility];  Service: Endoscopy;;   BIOPSY  07/11/2020   Procedure: BIOPSY;  Surgeon: Irving Copas., MD;  Location: [place];  Service: Gastroenterology;;   BIOPSY THYROID     CARPAL TUNNEL RELEASE Right 11/16/2019   Procedure: CARPAL TUNNEL RELEASE;  Surgeon: Leanora Cover, MD;  Location: [place];  Service: Orthopedics;  Laterality: Right;   COLONOSCOPY WITH PROPOFOL N/A 01/02/2020   Surgeon: Eloise Harman, DO;nonbleeding internal hemorrhoids, diverticulosis, status post biopsies of the entire colon (benign). Due for repeat in  2026.   Dialysis Shunts     previous one removed from left arm and now present one in right arm   DIALYSIS/PERMA CATHETER INSERTION Right 04/17/2016   Procedure: dialysis Catheter Insertion central veinous;  Surgeon: Waynetta Sandy, MD;  Location: [place] [facility];  Service: Cardiovascular;  Laterality: Right;   ESOPHAGOGASTRODUODENOSCOPY  12/15/2010   Rourk: erosive reflux esophagitis, MW tear, hiatal hernia (small), gastritis with no h.pylori, possibly  nsaid related   ESOPHAGOGASTRODUODENOSCOPY (EGD) WITH PROPOFOL N/A 07/11/2020    Surgeon: Irving Copas., MD; normal esophagus, 4 cm hiatal hernia, gastritis biopsied (mild chronic gastritis, negative H. pylori), single duodenal polyp (Brunner's gland hyperplasia), s/p duodenal biopsy benign, duodenal narrowing at the duodenal sweep.   EUS N/A 07/11/2020    Surgeon: Irving Copas., MD;  dilated CBD and common hepatic duct without stones or sludge in the bile duct, stones and biliary sludge in the gallbladder, pancreatic parenchyma with hyperechoic strands, pancreatic duct dilated with prominent sidebranches in the neck and body and tail, no pancreatic mass though visulalization limited, no ampullary lesion, no malignant appearing lymph node   EXCISION OF BREAST BIOPSY Right 05/04/2012   Procedure: EXCISION OF BREAST BIOPSY;  Surgeon: Donato Heinz, MD;  Location: [facility];  Service: General;  Laterality: Right;  Right Excisional Breast Biopsy   FISTULOGRAM Right 03/26/2014   Procedure: Right Arm Fistulogram with Venoplasty Right Subclavian Vein and Inominate Vein. Debridement Fistula Ulcer;  Surgeon: Conrad Golden, MD;  Location: [place];  Service: Vascular;  Laterality: Right;   FISTULOGRAM Right 03/29/2017   Procedure: FISTULOGRAM COMPLEX RIGHT ARM with Balloon angioplasty;  Surgeon: Waynetta Sandy, MD;  Location: [place];  Service: Vascular;  Laterality: Right;   HEMOSTASIS CLIP PLACEMENT  07/11/2020   Procedure: HEMOSTASIS CLIP PLACEMENT;  Surgeon: Irving Copas., MD;  Location: [place];  Service: Gastroenterology;;   INCISION AND DRAINAGE Right 12/27/2020   Procedure: INCISION AND DRAINAGE OF RIGHT FOREARM;  Surgeon: Waynetta Sandy, MD;  Location: [place];  Service: Vascular;  Laterality: Right;   INCISION AND DRAINAGE Right 12/31/2020   Procedure: INCISION AND DRAINAGE RIGHT ARM WITH PLACEMNENT OF WOUND VAC;  Surgeon: Waynetta Sandy, MD;   Location: [place];  Service: Vascular;  Laterality: Right;   INCISION AND DRAINAGE Right 01/07/2021   Procedure: RIGHT ARM INCISION AND DRAINAGE;  Surgeon: Waynetta Sandy, MD;  Location: [place];  Service: Vascular;  Laterality: Right;   IR FLUORO GUIDE CV LINE LEFT  12/26/2020   IR FLUORO GUIDE CV LINE LEFT  12/30/2020   IR GENERIC HISTORICAL  05/19/2016   IR REMOVAL TUN CV CATH W/O FL 05/19/2016 Markus Daft, MD [facility]-INTERV RAD   IR US GUIDE VASC ACCESS LEFT  12/26/2020   IR US GUIDE VASC ACCESS LEFT  12/30/2020   LIGATION OF ARTERIOVENOUS  FISTULA Right 11/12/2020   Procedure: LIGATION OF ARTERIOVENOUS  FISTULA WITH EXCISION OF NECROTIC TISSUE;  Surgeon: Waynetta Sandy, MD;  Location: [place];  Service: Vascular;  Laterality: Right;   LIGATION OF ARTERIOVENOUS  FISTULA Right 12/27/2020   Procedure: LIGATION OF ARTERIOVENOUS  FISTULA;  Surgeon: Waynetta Sandy, MD;  Location: [place];  Service: Vascular;  Laterality: Right;   PERIPHERAL VASCULAR BALLOON ANGIOPLASTY Right 04/17/2016   Procedure: Peripheral Vascular Balloon Angioplasty;  Surgeon: Waynetta Sandy, MD;  Location: [place] [facility];  Service: Cardiovascular;  Laterality: Right;  Central segment AV Fistula   PERIPHERAL VASCULAR BALLOON ANGIOPLASTY  Right 03/14/2018   Procedure: PERIPHERAL VASCULAR BALLOON ANGIOPLASTY;  Surgeon: Waynetta Sandy, MD;  Location: [place] [facility];  Service: Cardiovascular;  Laterality: Right;  subclavian vein   PERIPHERAL VASCULAR BALLOON ANGIOPLASTY Right 08/28/2019   Procedure: PERIPHERAL VASCULAR BALLOON ANGIOPLASTY;  Surgeon: Waynetta Sandy, MD;  Location: [place] [facility];  Service: Cardiovascular;  Laterality: Right;  upper arm   PERIPHERAL VASCULAR BALLOON ANGIOPLASTY Right 11/11/2020   Procedure: PERIPHERAL VASCULAR BALLOON ANGIOPLASTY;  Surgeon: Waynetta Sandy, MD;  Location: [place] [facility];  Service: Cardiovascular;  Laterality:  Right;   PERIPHERAL VASCULAR INTERVENTION Right 03/14/2018   Procedure: PERIPHERAL VASCULAR INTERVENTION;  Surgeon: Waynetta Sandy, MD;  Location: [place] [facility];  Service: Cardiovascular;  Laterality: Right;  subclavian vein   POLYPECTOMY  07/11/2020   Procedure: POLYPECTOMY;  Surgeon: Mansouraty, Telford Nab., MD;  Location: [facility];  Service: Gastroenterology;;   REVISON OF ARTERIOVENOUS FISTULA Right 11/12/2020   Procedure: CREATION OF RIGHT ARM ARTERIOVENOUS FISTULA;  Surgeon: Waynetta Sandy, MD;  Location: [place];  Service: Vascular;  Laterality: Right;   SHUNTOGRAM Right 10/19/2013   Procedure: FISTULOGRAM;  Surgeon: Conrad Supreme, MD;  Location: [facility] C[facility] LAB;  Service: Cardiovascular;  Laterality: Right;   SKIN SPLIT GRAFT Right 01/07/2021   Procedure: MYRIAD SKIN GRAFT PLACEMENT;  Surgeon: Waynetta Sandy, MD;  Location: [place];  Service: Vascular;  Laterality: Right;   TONSILLECTOMY     VISCERAL VENOGRAPHY Right 03/14/2018   Procedure: CENTRAL VENOGRAPHY;  Surgeon: Waynetta Sandy, MD;  Location: [place] [facility];  Service: Cardiovascular;  Laterality: Right;  upper ext fistula     reports that she quit smoking about 37 years ago. Her smoking use included cigarettes. She has never used smokeless tobacco. She reports that she does not drink alcohol and does not use drugs.  Allergies  Allergen Reactions   Lactose Intolerance (Gi) Other (See Comments)    On MAR   Latex Rash and Itching   Penicillins Rash and Swelling    Has patient had a PCN reaction causing immediate rash, facial/tongue/throat swelling, SOB or lightheadedness with hypotension: No Has patient had a PCN reaction causing severe rash involving mucus membranes or skin necrosis: No Has patient had a PCN reaction that required hospitalization No Has patient had a PCN reaction occurring within the last 10 years: No If all of the above answers are "NO", then may proceed with  Cephalosporin use.      Family History  Problem Relation Age of Onset   Anesthesia problems Neg Hx    Hypotension Neg Hx    Malignant hyperthermia Neg Hx    Pseudochol deficiency Neg Hx    Colon cancer Neg Hx        Unsure of parents who both died when she was a baby    Prior to Admission medications   Medication Sig Start Date End Date Taking? Authorizing Provider  Amino Acids-Protein Hydrolys (PRO-STAT 64 PO) Take by mouth. Give 15m by mouth in the morning for wound healing    [provider]  apixaban (ELIQUIS) 2.5 MG TABS tablet Take 1 tablet (2.5 mg total) by mouth 2 (two) times daily. 11/27/20   CUlyses Amor PA-C  baclofen (LIORESAL) 10 MG tablet Take 10 mg by mouth daily as needed for muscle spasms.    [provider]  clopidogrel (PLAVIX) 75 MG tablet Take 1 tablet (75 mg total) by mouth daily with breakfast. 07/14/20  Mansouraty, Telford Nab., MD  collagenase (SANTYL) ointment Apply 1 application topically daily. Apply to sacrum topically every day shift for wound healing    [provider]  cyanocobalamin (,VITAMIN B-12,) 1000 MCG/ML injection Inject 1,000 mcg into the muscle every 30 (thirty) days. On the 28th of every month    [provider]  dolutegravir (TIVICAY) 50 MG tablet Take 50 mg by mouth daily at 4 PM.    [provider]  Emollient (MINERIN) LOTN Apply 1 application topically every 12 (twelve) hours as needed (dry skin). Apply to feet and ankles    [provider]  fentaNYL (DURAGESIC) 25 MCG/HR Place 1 patch onto the skin every 3 (three) days. 01/09/21   Oswald Hillock, MD  lamivudine (EPIVIR) 100 MG tablet Take 50 mg by mouth daily. (0800)    [provider]  Lidocaine HCl 4 % CREA Apply 1 application topically every 12 (twelve) hours as needed (pain).    [provider]  lidocaine-prilocaine (EMLA) cream Apply 1 application topically See admin instructions. (0900) Apply to fistula site one  hour before dialysis on Tuesday, Thursday, and Saturday.    [provider]  melatonin 3 MG TABS tablet Take 3 mg by mouth at bedtime. (2000)    [provider]  methadone (DOLOPHINE) 5 MG tablet Take 0.5 tablets (2.5 mg total) by mouth daily at 8 pm. (2000) 01/09/21   Oswald Hillock, MD  metoprolol tartrate (LOPRESSOR) 25 MG tablet Take 1 tablet (25 mg total) by mouth 2 (two) times daily. 11/27/20   Ulyses Amor, PA-C  mirtazapine (REMERON) 15 MG tablet Take 15 mg by mouth at bedtime. 12/05/20   [provider]  Multiple Vitamin (MULTIVITAMIN WITH MINERALS) TABS tablet Take 1 tablet by mouth at bedtime. (2000)    [provider]  nitroGLYCERIN (NITROSTAT) 0.4 MG SL tablet Place 0.4 mg under the tongue every 5 (five) minutes x 3 doses as needed for chest pain.     [provider]  ondansetron (ZOFRAN ODT) 8 MG disintegrating tablet Take 1 tablet (8 mg total) by mouth every 8 (eight) hours as needed for nausea or vomiting. 12/16/20   Erenest Rasher, PA-C  Oxycodone HCl 10 MG TABS Take 1 tablet (10 mg total) by mouth every 12 (twelve) hours as needed (severe pain). 01/09/21   Oswald Hillock, MD  pantoprazole (PROTONIX) 40 MG tablet Take 1 tablet (40 mg total) by mouth daily before breakfast. 12/16/20   Erenest Rasher, PA-C  promethazine (PHENERGAN) 12.5 MG tablet Take 12.5 mg by mouth See admin instructions. Take 1 tablet (12.5 mg) by mouth in the morning every Tuesday, Thursday and Saturday for nausea/vomiting and every 6 hours as needed for n/v    [provider]  senna (SENOKOT) 8.6 MG tablet Take 2 tablets by mouth every evening. (2100)    [provider]  vancomycin (VANCOCIN) 500-5 MG/100ML-% IVPB Inject 100 mLs (500 mg total) into the vein Every Tuesday,Thursday,and Saturday with dialysis. 01/11/21 02/12/21  Oswald Hillock, MD  venlafaxine XR (EFFEXOR-XR) 150 MG 24 hr capsule Take 150 mg by mouth daily with breakfast. (0900)     [provider]  vitamin C (ASCORBIC ACID) 500 MG tablet Take 500 mg by mouth 2 (two) times daily.    [provider]  zidovudine (RETROVIR) 100 MG capsule Take 100 mg by mouth 3 (three) times daily. (0900, 1300, & 2100)    [provider]  Zinc  Sulfate (ZINC-220 PO) Take 1 tablet by mouth daily.    [provider]    Physical Exam: Vitals:   01/23/21 1200 01/23/21 1230 01/23/21 1300 01/23/21 1330  BP: (!) 145/96 (!) 185/102 (!) 168/99 (!) 142/93  Pulse: 62 66 62 64  Resp: _0 Temp:      TempSrc:      SpO2: 100% 100% 97% 100%    Constitutional: NAD, calm, comfortable Vitals:   01/23/21 1200 01/23/21 1230 01/23/21 1300 01/23/21 1330  BP: (!) 145/96 (!) 185/102 (!) 168/99 (!) 142/93  Pulse: 62 66 62 64  Resp: _1 Temp:      TempSrc:      SpO2: 100% 100% 97% 100%   Eyes: lids and conjunctivae normal Neck: normal, supple Respiratory: clear to auscultation bilaterally. Normal respiratory effort. No accessory muscle use.  Cardiovascular: Regular rate and rhythm, no murmurs. Abdomen: no tenderness, no distention. Bowel sounds positive.  Musculoskeletal:  No edema. Skin: no rashes, lesions, ulcers.  Right AV fistula with dressings clean dry and intact Psychiatric: Flat affect  Labs on Admission: I have personally reviewed following labs and imaging studies  CBC: Recent Labs  Lab 01/23/21 1016  WBC 3.2*  NEUTROABS 2.4  HGB 10.1*  HCT 34.4*  MCV 104.2*  PLT 166*   Basic Metabolic Panel: Recent Labs  Lab 01/23/21 1016  NA 137  K 4.3  CL 99  CO2 24  GLUCOSE 79  BUN 29*  CREATININE 6.80*  CALCIUM 8.5*   GFR: Estimated Creatinine Clearance: 5.7 mL/min (A) (by C-G formula based on SCr of 6.8 mg/dL (H)). Liver Function Tests: Recent Labs  Lab 01/23/21 1016  AST 17  ALT 10  ALKPHOS 87  BILITOT 0.9  PROT 7.5  ALBUMIN 3.0*   No results for input(s): LIPASE, AMYLASE in the last 168 hours. No results for  input(s): AMMONIA in the last 168 hours. Coagulation Profile: No results for input(s): INR, PROTIME in the last 168 hours. Cardiac Enzymes: No results for input(s): CKTOTAL, CKMB, CKMBINDEX, TROPONINI in the last 168 hours. BNP (last 3 results) No results for input(s): PROBNP in the last 8760 hours. HbA1C: No results for input(s): HGBA1C in the last 72 hours. CBG: Recent Labs  Lab 01/23/21 1019  GLUCAP 73   Lipid Profile: No results for input(s): CHOL, HDL, LDLCALC, TRIG, CHOLHDL, LDLDIRECT in the last 72 hours. Thyroid Function Tests: No results for input(s): TSH, T4TOTAL, FREET4, T3FREE, THYROIDAB in the last 72 hours. Anemia Panel: No results for input(s): VITAMINB12, FOLATE, FERRITIN, TIBC, IRON, RETICCTPCT in the last 72 hours. Urine analysis:    Component Value Date/Time   COLORURINE YELLOW 01/19/2009 1855   APPEARANCEUR CLEAR 01/19/2009 1855   LABSPEC 1.015 01/19/2009 1855   PHURINE 7.5 01/19/2009 1855   GLUCOSEU NEGATIVE 01/19/2009 1855   HGBUR SMALL (A) 01/19/2009 1855   BILIRUBINUR NEGATIVE 01/19/2009 1855   KETONESUR NEGATIVE 01/19/2009 1855   PROTEINUR 100 (A) 01/19/2009 1855   UROBILINOGEN 0.2 01/19/2009 1855   NITRITE NEGATIVE 01/19/2009 1855   LEUKOCYTESUR NEGATIVE 01/19/2009 1855    Radiological Exams on Admission: CT Head Wo Contrast  Result Date: 01/23/2021 CLINICAL DATA:  Mental status changes of unknown cause in a 70 year old female. EXAM: CT HEAD WITHOUT CONTRAST TECHNIQUE: Contiguous axial images were obtained from the base of the skull through the vertex without intravenous contrast. COMPARISON:  April 07, 2020. FINDINGS: Brain: No evidence of acute infarction, hemorrhage, hydrocephalus, extra-axial collection or  mass lesion/mass effect. Signs of atrophy and chronic microvascular ischemic change as before. Vascular: No hyperdense vessel or unexpected calcification. Skull: Normal. Negative for fracture or focal lesion. Signs of renal osteodystrophy.  Sinuses/Orbits: Visualized paranasal sinuses and orbits without acute process. Other: None IMPRESSION: No acute intracranial pathology. Signs of atrophy and chronic microvascular ischemic change as before. Signs of renal osteodystrophy. Electronically Signed   By: Zetta Bills M.D.   On: 01/23/2021 10:45   DG Chest Portable 1 View  Result Date: 01/23/2021 CLINICAL DATA:  Syncope. EXAM: PORTABLE CHEST 1 VIEW COMPARISON:  Chest x-ray dated December 24, 2020. FINDINGS: Unchanged tunneled left internal jugular dialysis catheter. Stable cardiomegaly. Unchanged right brachiocephalic vein stent. Unchanged central pulmonary artery enlargement. No focal consolidation, pleural effusion, or pneumothorax. No acute osseous abnormality. IMPRESSION: 1. No active disease. Electronically Signed   By: Titus Dubin M.D.   On: 01/23/2021 10:49    EKG: Independently reviewed. SR 69bpm.  Assessment/Plan Principal Problem:   Acute encephalopathy    Acute encephalopathy/delirium in the setting of syncopal episode during HD -CT head with no acute findings -TSH and ammonia levels ordered -EEG ordered -Brain MRI 11/25 -Plan to try and obtain urine sample to assess for UTI -Recent 2D echocardiogram 9/22 with LVEF 60 to 65% -Plan to obtain carotid ultrasound  Recent right AV fistula MRSA infection -Continue on IV vancomycin with dialysis for total 6 weeks with last day 02/12/2021 -Continue wound VAC and ongoing wound care  ESRD on hemodialysis TTS -Appreciate nephrology consultation for further hemodialysis while inpatient -Currently dialyzing via left IJ [facility]  Paroxysmal atrial flutter -Continue metoprolol and Eliquis -Monitor on telemetry -EKG currently showing sinus rhythm  Hypertension -Continue metoprolol -Labetalol prn severe elevations  HIV -Recent CD4 252 on 11/10 -Continue antiretroviral therapy  Anemia of chronic disease -Continue to monitor hemoglobin levels which are currently  stable  History of chronic pain -Currently on fentanyl patch which will be discontinued and methadone will be held until mentation improves  Severe protein calorie malnutrition -Encourage oral intake/protein shakes   DVT prophylaxis: Eliquis Code Status: DNR Family Communication: None at bedside Disposition Plan:Admit for evaluation of encephalopathy Consults called:Nephrology Admission status: Inpatient, Tele  Indi Willhite D Dierks Wach DO [facility]  If 7PM-7AM, please contact night-coverage [email or web address]  01/23/2021, 2:44 PM

## 2021-01-23 NOTE — Progress Notes (Signed)
Patient arrive on floor at 1825. Got in report that blood pressures was 178/91 but that it would be rechecked and labetalol pushed. Upon arriving on floor, ER nurse reported that labatolol was pushed prior to her being transferred. Blood pressure has been checked several times by myself and Butch Penny, RN, they are continuously running high at systolic 771-165 and dystolic 790-383. Dr.Shah notified by Butch Penny, RN about blood pressures and I messaged due to patient with gagging reaction and didn't seem to be swallowing well, suction is set up in room, zofran has been given as well. And per Dr. Manuella Ghazi respiratory can deep suction if needed. All this information has also been passed along to night nurse and charge nurse [place].

## 2021-01-23 NOTE — ED Triage Notes (Signed)
Pt was at dialysis getting her run when she had a syncopal event. Pt states she started to hurt all over then passed out. Dialysis center gave nitro due to pt complaining of pain after walking up  Staff states about 500cc of fluid taken off before event   On arrival pt denies pain, alert and oriented. Complaining of feeling lightheaded and nauseous Able to move all extremities

## 2021-01-23 NOTE — ED Provider Notes (Signed)
  Face-to-face evaluation   History: She is here for evaluation of a syncopal episode shortly after she started dialysis this morning.  Apparently she complained of chest pain after arousing, and was treated with nitroglycerin.  Patient is demented unable to give any history.  Level 5 caveat-dementia  Physical exam: Elderly, alert and cooperative.  She is responsive but confused.  No dysarthria.  Heart regular rate and rhythm without murmur.  Lungs clear anteriorly.  Vascular device left upper chest wall, site and appliance appear normal  Medical screening examination/treatment/procedure(s) were conducted as a shared visit with non-physician practitioner(s) and myself.  I personally evaluated the patient during the encounter    Daleen Bo, MD 01/25/21 [phone]

## 2021-01-24 ENCOUNTER — Inpatient Hospital Stay (HOSPITAL_COMMUNITY): Payer: Medicare Other

## 2021-01-24 ENCOUNTER — Ambulatory Visit (HOSPITAL_COMMUNITY)
Admission: RE | Admit: 2021-01-24 | Discharge: 2021-01-24 | Disposition: A | Discharge status: Home / Self Care | Payer: Medicare Other | Source: Ambulatory Visit | Attending: Gastroenterology | Admitting: Gastroenterology

## 2021-01-24 ENCOUNTER — Encounter (HOSPITAL_COMMUNITY): Payer: Self-pay | Admitting: Internal Medicine

## 2021-01-24 ENCOUNTER — Inpatient Hospital Stay (HOSPITAL_COMMUNITY)
Admit: 2021-01-24 | Discharge: 2021-01-24 | Disposition: A | Discharge status: Home / Self Care | Payer: Medicare Other | Attending: Internal Medicine | Admitting: Internal Medicine

## 2021-01-24 DIAGNOSIS — G9340 Encephalopathy, unspecified: Secondary | ICD-10-CM | POA: Diagnosis not present

## 2021-01-24 DIAGNOSIS — I6783 Posterior reversible encephalopathy syndrome: Secondary | ICD-10-CM | POA: Diagnosis not present

## 2021-01-24 DIAGNOSIS — R569 Unspecified convulsions: Secondary | ICD-10-CM | POA: Diagnosis not present

## 2021-01-24 LAB — CBC WITH DIFFERENTIAL/PLATELET
Abs Immature Granulocytes: 0.03 10*3/uL (ref 0.00–0.07)
Basophils Absolute: 0 10*3/uL (ref 0.0–0.1)
Basophils Relative: 0 %
Eosinophils Absolute: 0 10*3/uL (ref 0.0–0.5)
Eosinophils Relative: 0 %
HCT: 34 % — ABNORMAL LOW (ref 36.0–46.0)
Hemoglobin: 10.2 g/dL — ABNORMAL LOW (ref 12.0–15.0)
Immature Granulocytes: 1 %
Lymphocytes Relative: 14 %
Lymphs Abs: 0.7 10*3/uL (ref 0.7–4.0)
MCH: 31.3 pg (ref 26.0–34.0)
MCHC: 30 g/dL (ref 30.0–36.0)
MCV: 104.3 fL — ABNORMAL HIGH (ref 80.0–100.0)
Monocytes Absolute: 0.2 10*3/uL (ref 0.1–1.0)
Monocytes Relative: 5 %
Neutro Abs: 3.8 10*3/uL (ref 1.7–7.7)
Neutrophils Relative %: 80 %
Platelets: 143 10*3/uL — ABNORMAL LOW (ref 150–400)
RBC: 3.26 MIL/uL — ABNORMAL LOW (ref 3.87–5.11)
RDW: 18.4 % — ABNORMAL HIGH (ref 11.5–15.5)
WBC: 4.7 10*3/uL (ref 4.0–10.5)
nRBC: 0 % (ref 0.0–0.2)

## 2021-01-24 LAB — COMPREHENSIVE METABOLIC PANEL
ALT: 11 U/L (ref 0–44)
ALT: 11 U/L (ref 0–44)
AST: 27 U/L (ref 15–41)
AST: 20 U/L (ref 15–41)
Albumin: 3.3 g/dL — ABNORMAL LOW (ref 3.5–5.0)
Albumin: 3.2 g/dL — ABNORMAL LOW (ref 3.5–5.0)
Alkaline Phosphatase: 95 U/L (ref 38–126)
Alkaline Phosphatase: 84 U/L (ref 38–126)
Anion gap: 22 — ABNORMAL HIGH (ref 5–15)
Anion gap: 15 (ref 5–15)
BUN: 35 mg/dL — ABNORMAL HIGH (ref 8–23)
BUN: 43 mg/dL — ABNORMAL HIGH (ref 8–23)
CO2: 17 mmol/L — ABNORMAL LOW (ref 22–32)
CO2: 24 mmol/L (ref 22–32)
Calcium: 8.7 mg/dL — ABNORMAL LOW (ref 8.9–10.3)
Calcium: 8.5 mg/dL — ABNORMAL LOW (ref 8.9–10.3)
Chloride: 98 mmol/L (ref 98–111)
Chloride: 100 mmol/L (ref 98–111)
Creatinine, Ser: 8.31 mg/dL — ABNORMAL HIGH (ref 0.44–1.00)
Creatinine, Ser: 9.38 mg/dL — ABNORMAL HIGH (ref 0.44–1.00)
GFR, Estimated: 5 mL/min — ABNORMAL LOW (ref >60)
GFR, Estimated: 4 mL/min — ABNORMAL LOW (ref >60)
Glucose, Bld: 132 mg/dL — ABNORMAL HIGH (ref 70–99)
Glucose, Bld: 125 mg/dL — ABNORMAL HIGH (ref 70–99)
Potassium: 4.4 mmol/L (ref 3.5–5.1)
Potassium: 6 mmol/L — ABNORMAL HIGH (ref 3.5–5.1)
Sodium: 137 mmol/L (ref 135–145)
Sodium: 139 mmol/L (ref 135–145)
Total Bilirubin: 0.9 mg/dL (ref 0.3–1.2)
Total Bilirubin: 0.5 mg/dL (ref 0.3–1.2)
Total Protein: 8.4 g/dL — ABNORMAL HIGH (ref 6.5–8.1)
Total Protein: 8 g/dL (ref 6.5–8.1)

## 2021-01-24 LAB — BLOOD GAS, VENOUS
Acid-Base Excess: 0.1 mmol/L (ref 0.0–2.0)
Acid-base deficit: 7.7 mmol/L — ABNORMAL HIGH (ref 0.0–2.0)
Bicarbonate: 16.3 mmol/L — ABNORMAL LOW (ref 20.0–28.0)
Bicarbonate: 24.8 mmol/L (ref 20.0–28.0)
Drawn by: 6643
FIO2: 36
FIO2: 28
O2 Saturation: 56.3 %
O2 Saturation: 97.3 %
Patient temperature: 36.9
Patient temperature: 37.9
pCO2, Ven: 62.8 mmHg — ABNORMAL HIGH (ref 44.0–60.0)
pCO2, Ven: 37.4 mmHg — ABNORMAL LOW (ref 44.0–60.0)
pH, Ven: 7.131 — CL (ref 7.250–7.430)
pH, Ven: 7.425 (ref 7.250–7.430)
pO2, Ven: 44.8 mmHg (ref 32.0–45.0)
pO2, Ven: 101 mmHg — ABNORMAL HIGH (ref 32.0–45.0)

## 2021-01-24 LAB — CBC
HCT: 40.1 % (ref 36.0–46.0)
Hemoglobin: 11.5 g/dL — ABNORMAL LOW (ref 12.0–15.0)
MCH: 30.9 pg (ref 26.0–34.0)
MCHC: 28.7 g/dL — ABNORMAL LOW (ref 30.0–36.0)
MCV: 107.8 fL — ABNORMAL HIGH (ref 80.0–100.0)
Platelets: 131 10*3/uL — ABNORMAL LOW (ref 150–400)
RBC: 3.72 MIL/uL — ABNORMAL LOW (ref 3.87–5.11)
RDW: 18.4 % — ABNORMAL HIGH (ref 11.5–15.5)
WBC: 4.9 10*3/uL (ref 4.0–10.5)
nRBC: 0 % (ref 0.0–0.2)

## 2021-01-24 LAB — PROTIME-INR
INR: 1.2 (ref 0.8–1.2)
Prothrombin Time: 15.3 seconds — ABNORMAL HIGH (ref 11.4–15.2)

## 2021-01-24 LAB — AMMONIA: Ammonia: 65 umol/L — ABNORMAL HIGH (ref 9–35)

## 2021-01-24 LAB — MAGNESIUM: Magnesium: 2.3 mg/dL (ref 1.7–2.4)

## 2021-01-24 LAB — GLUCOSE, CAPILLARY
Glucose-Capillary: 127 mg/dL — ABNORMAL HIGH (ref 70–99)
Glucose-Capillary: 126 mg/dL — ABNORMAL HIGH (ref 70–99)

## 2021-01-24 LAB — T4, FREE: Free T4: 1.24 ng/dL — ABNORMAL HIGH (ref 0.61–1.12)

## 2021-01-24 IMAGING — MR MR HEAD W/O CM
11 of 12 series · 41 of 48 positions shown · non-contrast
Comparison: CT head [DATE], MRI [DATE]

CLINICAL DATA: Mental status change. Dialysis patient. Possible
seizure.

EXAM:
MRI HEAD WITHOUT CONTRAST
TECHNIQUE: Multiplanar, multiecho pulse sequences of the brain and surrounding
structures were obtained without intravenous contrast.

[Series 5: DWI · axial · 4.0mm · 0.88mm/px · z∈[-75,+62]mm · 4 of 36 slices shown (1 of 6)]
[im 1/36]
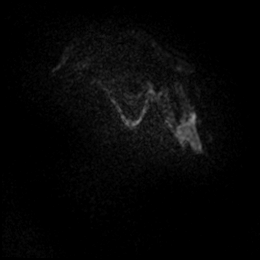
[im 12/36]
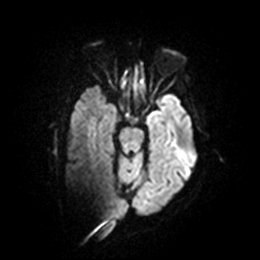
[im 24/36]
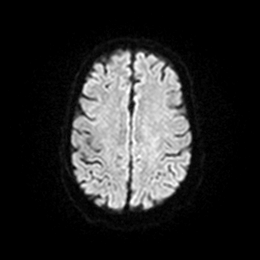
[im 36/36]
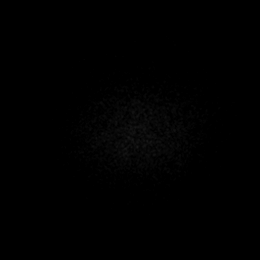

[Series 5: DWI · axial · 4.0mm · 0.88mm/px · z∈[-75,+62]mm · 4 of 36 slices shown (2 of 6)]
[im 1/36]
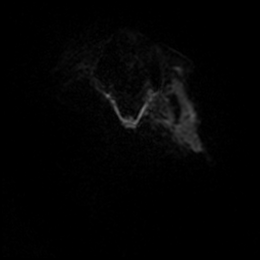
[im 12/36]
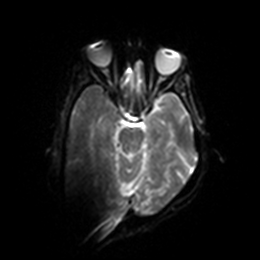
[im 24/36]
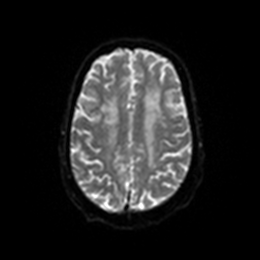
[im 36/36]
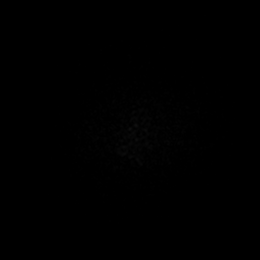

[Series 6: DWI · axial · 4.0mm · 0.88mm/px · z∈[-75,+58]mm · 4 of 35 slices shown (3 of 6)]
[im 1/35]
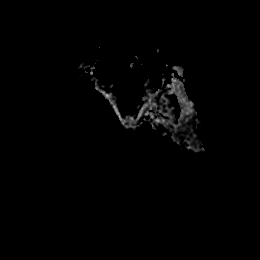
[im 12/35]
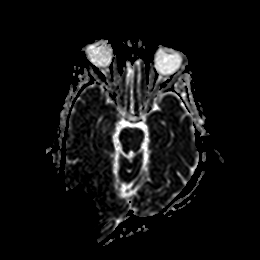
[im 23/35]
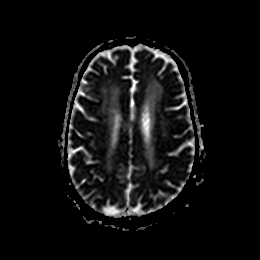
[im 35/35]
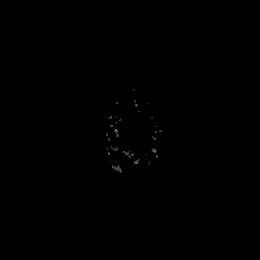

[Series 7: DWI · coronal · 4.0mm · 0.88mm/px · 4 of 32 slices shown (4 of 6)]
[im 1/32]
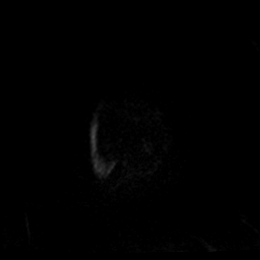
[im 11/32]
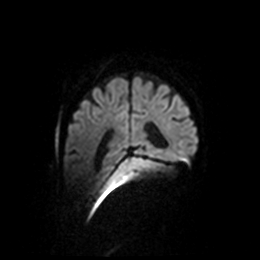
[im 21/32]
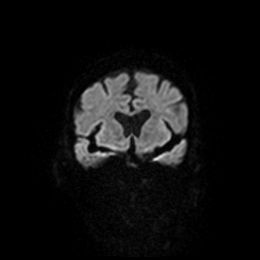
[im 32/32]
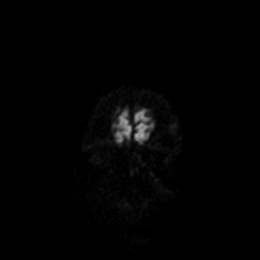

[Series 7: DWI · coronal · 4.0mm · 0.88mm/px · 4 of 32 slices shown (5 of 6)]
[im 1/32]
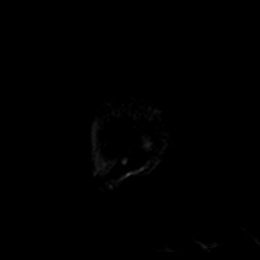
[im 11/32]
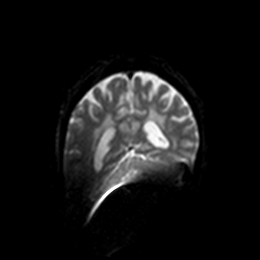
[im 21/32]
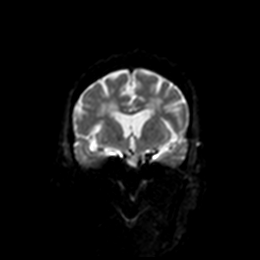
[im 32/32]
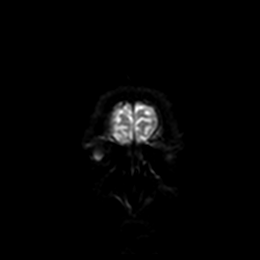

[Series 8: DWI · coronal · 4.0mm · 0.88mm/px · 4 of 32 slices shown (6 of 6)]
[im 1/32]
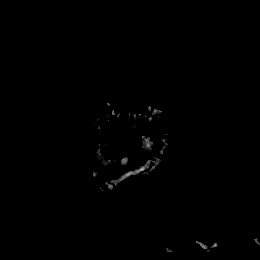
[im 11/32]
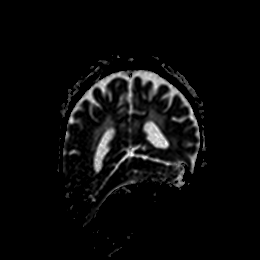
[im 21/32]
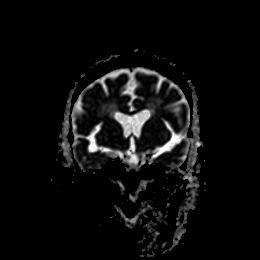
[im 32/32]
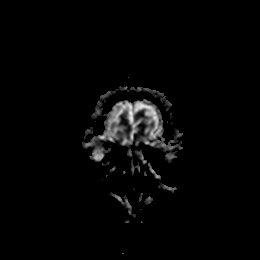

[Series 9: T1 · sagittal · 5.0mm · 0.80mm/px · 3 of 23 slices shown]
[im 1/23]
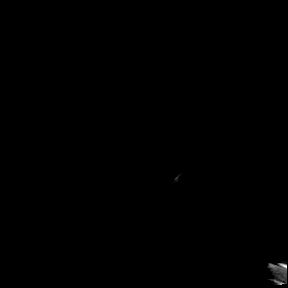
[im 12/23]
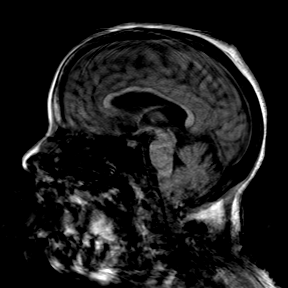
[im 23/23]
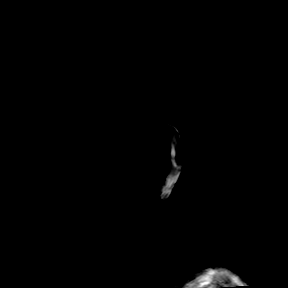

[Series 10: T2 · axial · 5.0mm · 0.72mm/px · z∈[-82,+69]mm · 3 of 23 slices shown (1 of 2)]
[im 1/23]
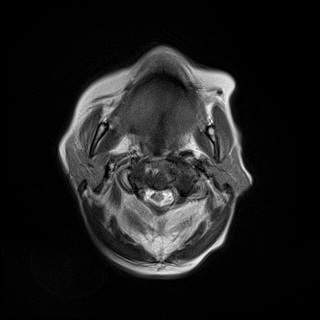
[im 12/23]
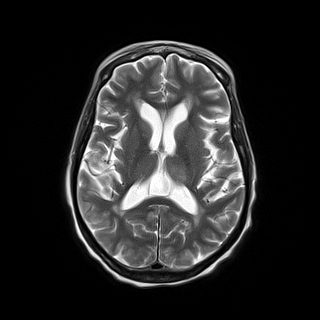
[im 23/23]
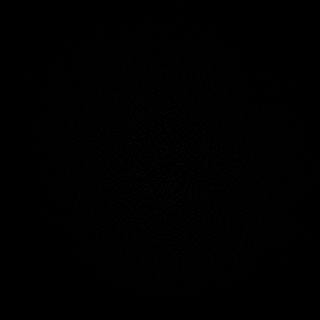

[Series 11: ax hemo · axial · 5.0mm · 0.86mm/px · z∈[-76,+66]mm · 3 of 25 slices shown]
[im 1/25]
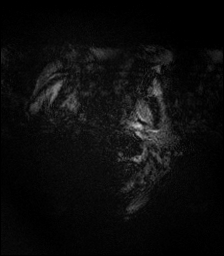
[im 13/25]
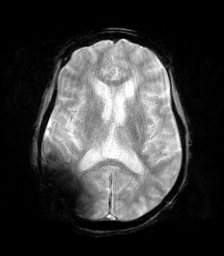
[im 25/25]
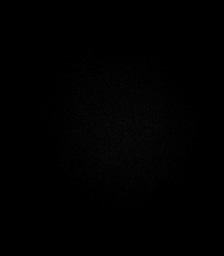

[Series 12: FLAIR · axial · 4.0mm · 0.43mm/px · z∈[-78,+68]mm · 5 of 38 slices shown]
[im 1/38]
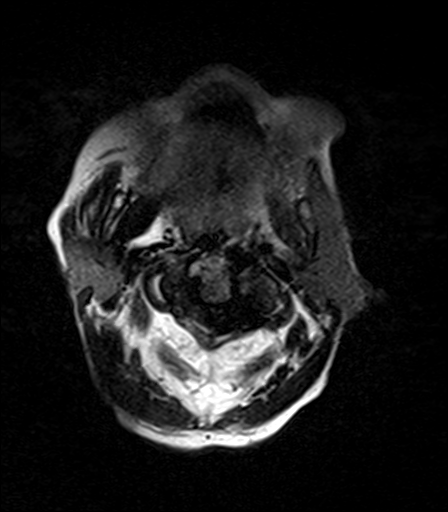
[im 10/38]
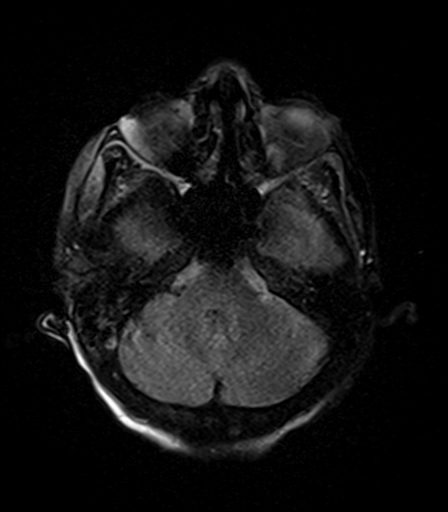
[im 19/38]
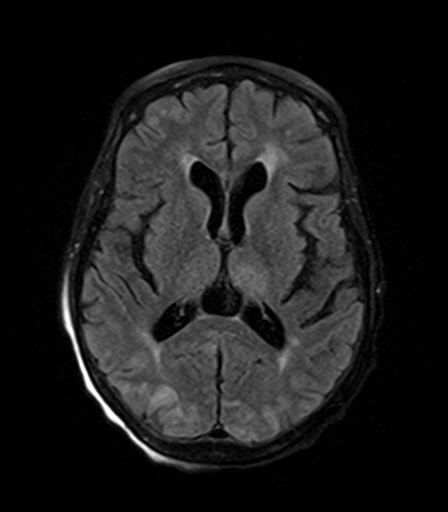
[im 28/38]
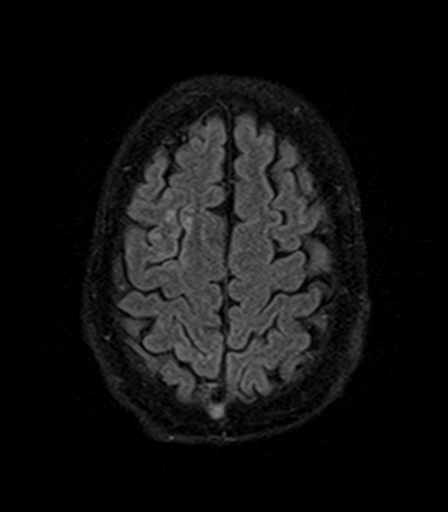
[im 38/38]
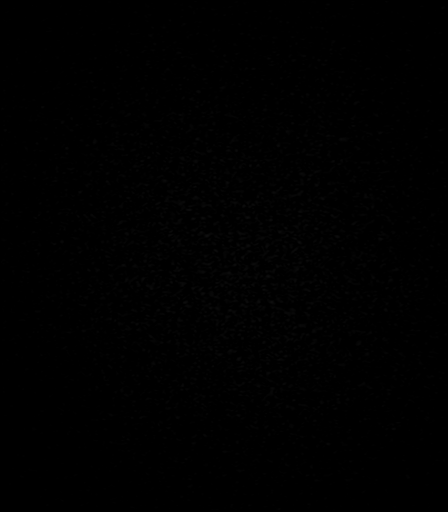

[Series 14: T2 · coronal · 5.0mm · 0.72mm/px · 3 of 28 slices shown (2 of 2)]
[im 1/28]
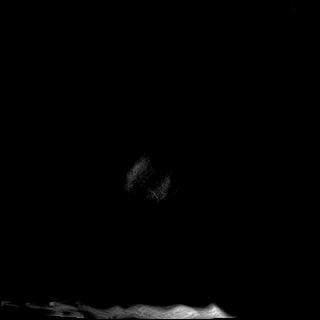
[im 14/28]
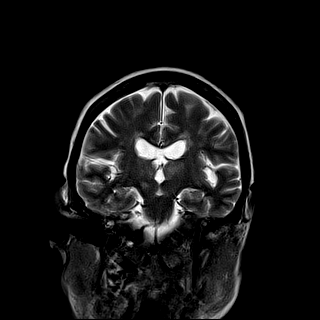
[im 28/28]
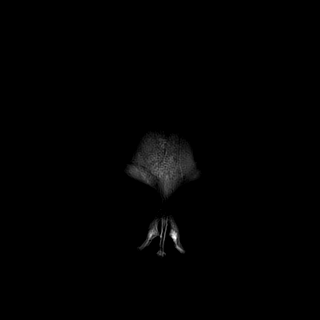

[41 of 48 positions shown; findings below may reference images not displayed]

FINDINGS: Brain: Motion degraded study. Susceptibility artifact right
occipital region. No metal is seen in this area on CT.

Cortical and subcortical FLAIR hyperintensity is present in the
occipital parietal lobes bilaterally, not present on the recent MRI.

Moderate to extensive chronic white matter changes throughout the
periventricular deep white matter bilaterally similar to the prior
MRI. Brainstem intact. 12 mm hyperintensity right cerebellum not
seen on the prior MRI.

Negative for acute infarct.  Negative for hemorrhage or mass.

Vascular: Normal arterial flow voids.

Skull and upper cervical spine: Limited evaluation due to artifact.

Sinuses/Orbits: Negative

Other: None
IMPRESSION: Negative for acute infarct

Cortical and subcortical edema in the occipital parietal lobes
bilaterally, right for slightly greater than left. This is not
present on the prior MRI of [DATE]. Findings may be due to PRES.
Correlate with hypertension history.

## 2021-01-24 IMAGING — CT CT HEAD W/O CM
3 series · 15 of 47 positions shown, 18 images · non-contrast
Comparison: [DATE].

CLINICAL DATA: Possible seizure prior to arrival, near syncope
during dialysis.

EXAM:
CT HEAD WITHOUT CONTRAST
TECHNIQUE: Contiguous axial images were obtained from the base of the skull
through the vertex without intravenous contrast.

[Series 2: head w o · axial · 0.40mm/px · z∈[-9,+121]mm · 9 of 32 slices shown, 12 images]
[im 3/32  brain]
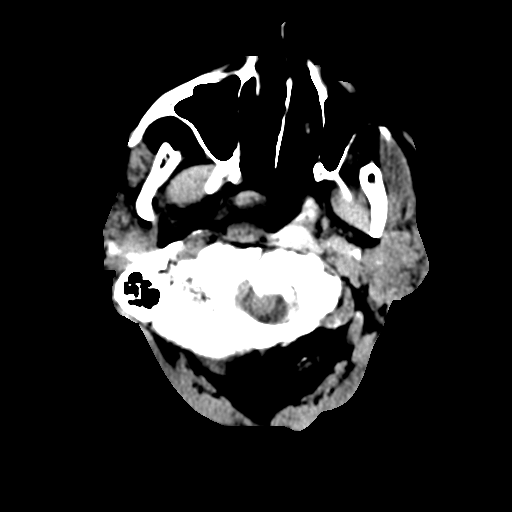
[im 3/32  bone]
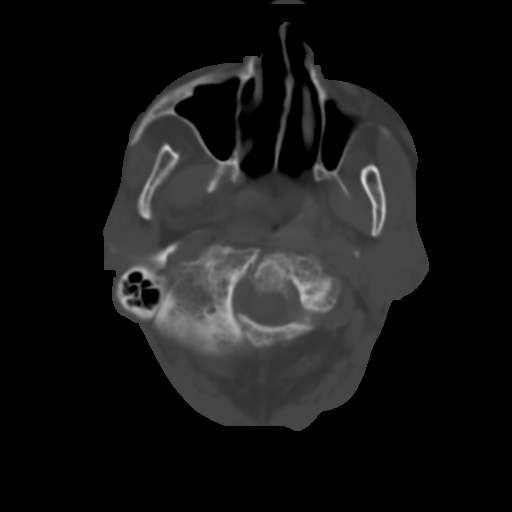
[im 6/32  brain]
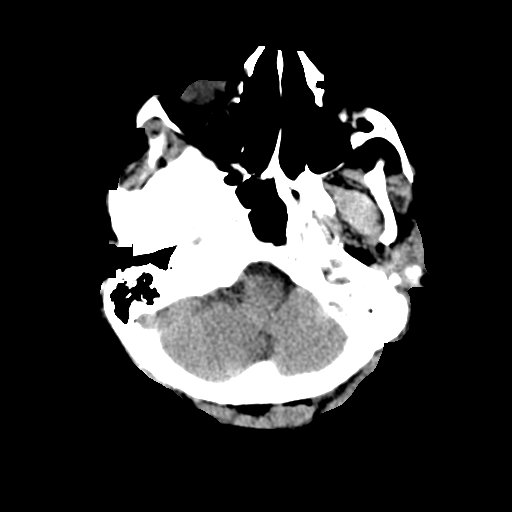
[im 9/32  brain]
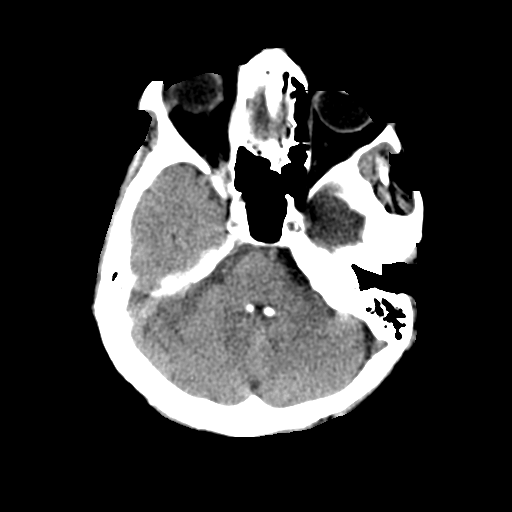
[im 12/32  brain]
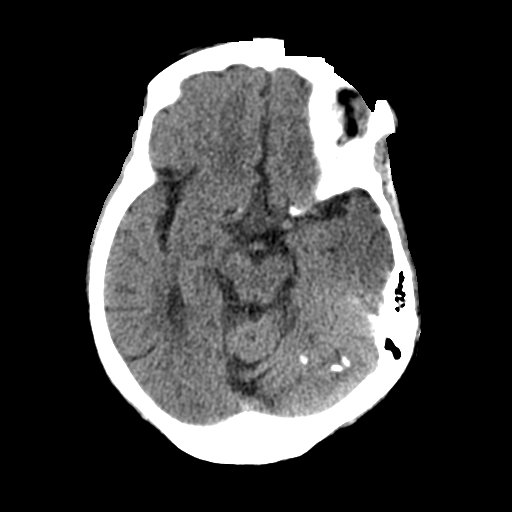
[im 17/32  brain]
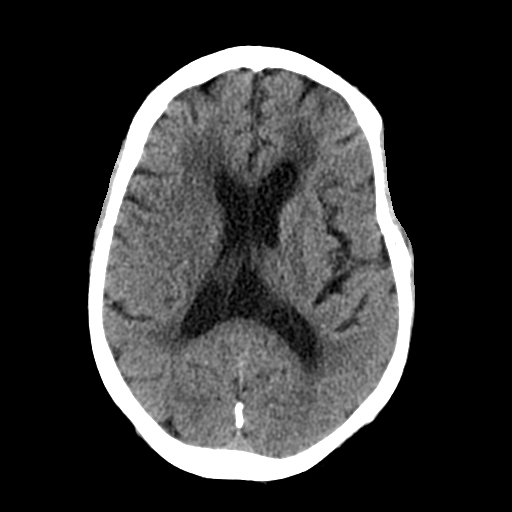
[im 17/32  bone]
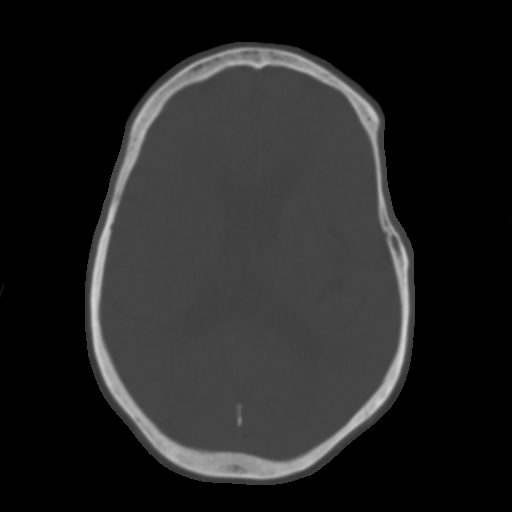
[im 20/32  brain]
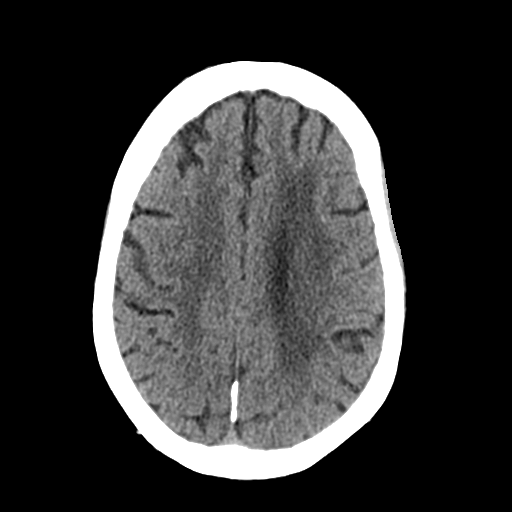
[im 23/32  brain]
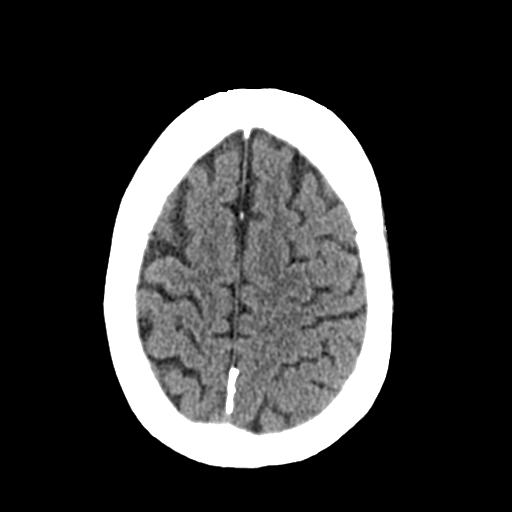
[im 26/32  brain]
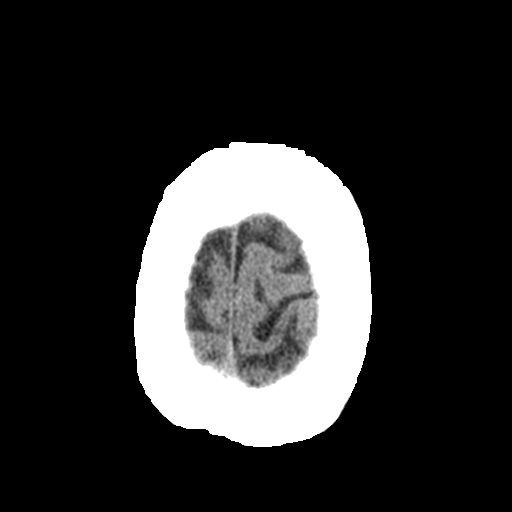
[im 29/32  brain]
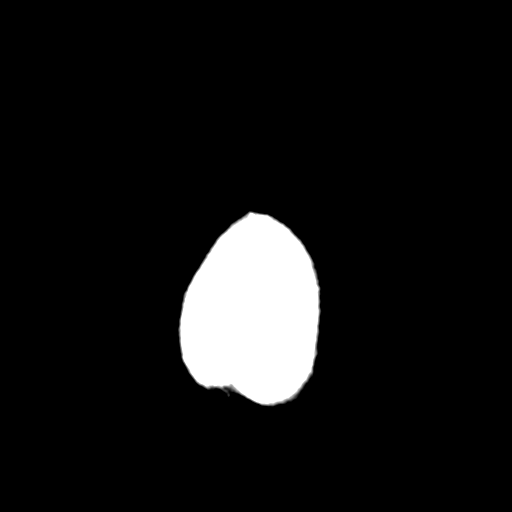
[im 29/32  bone]
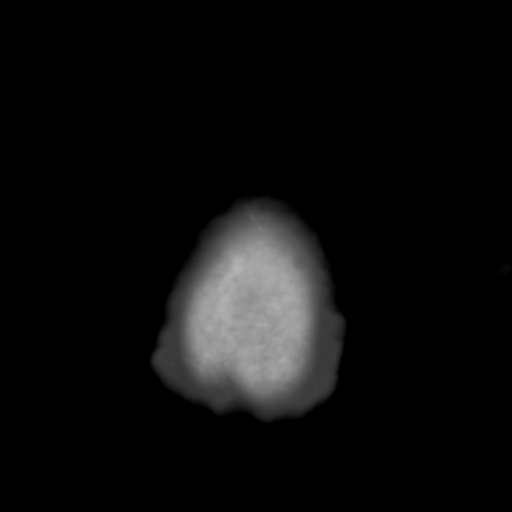

[Series 4: coronal soft · coronal · 0.31mm/px · 3 of 84 slices shown]
[im 28/84  brain]
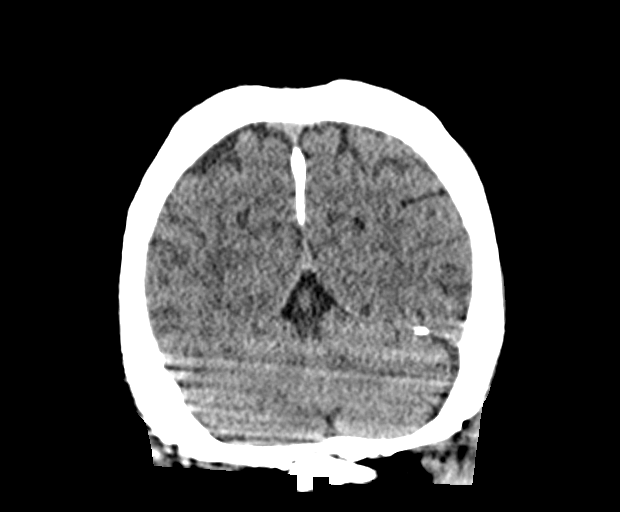
[im 37/84  brain]
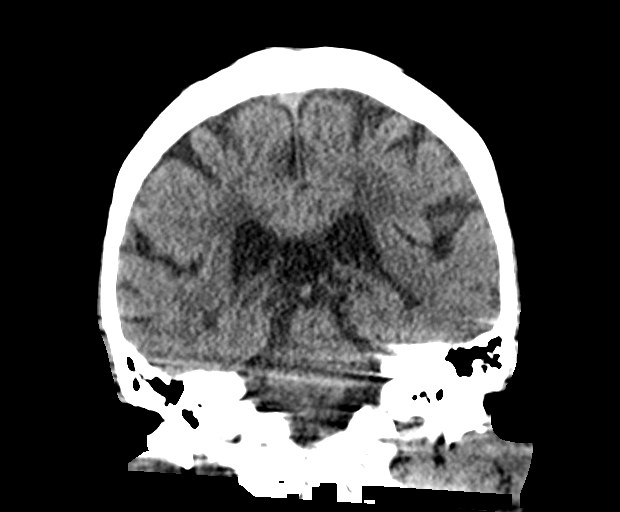
[im 47/84  brain]
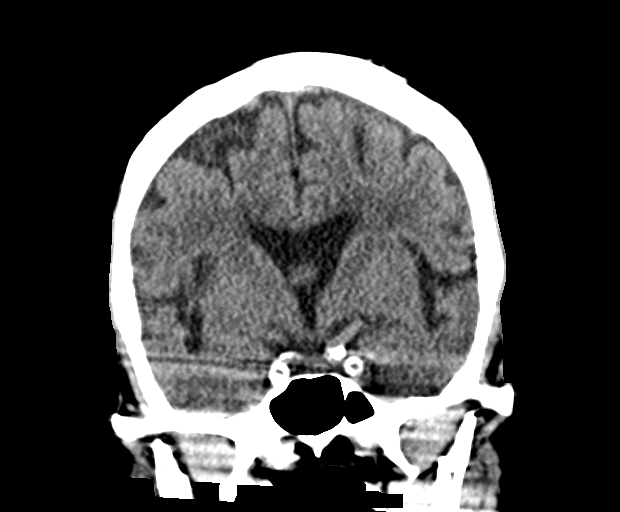

[Series 5: sagittal soft · sagittal · 0.36mm/px · 3 of 52 slices shown]
[im 18/52  brain]
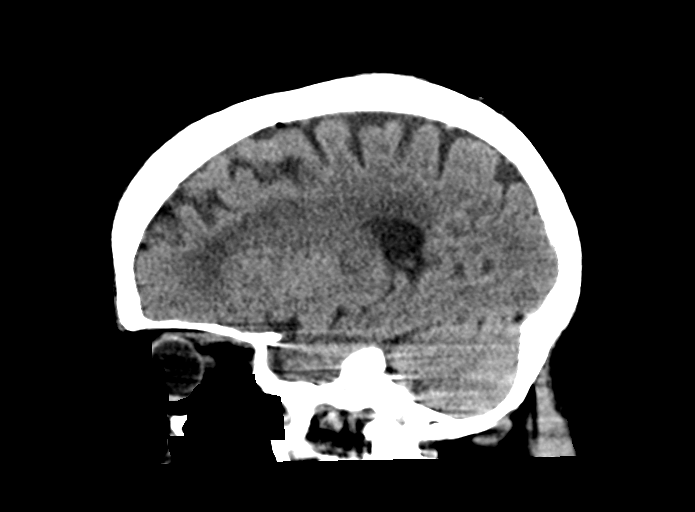
[im 26/52  brain]
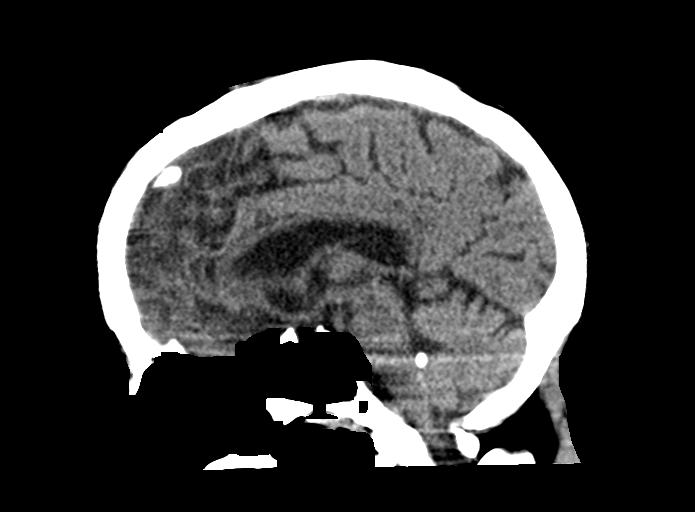
[im 35/52  brain]
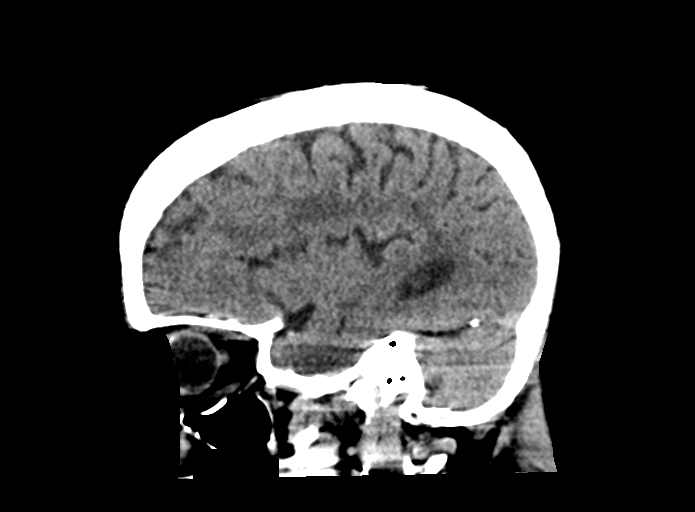

[15 of 47 positions shown; findings below may reference images not displayed]

FINDINGS: Brain: No evidence of acute infarction, hemorrhage, cerebral edema,
mass, mass effect, or midline shift. No hydrocephalus or extra-axial
fluid collection. Periventricular white matter changes, likely the
sequela of chronic small vessel ischemic disease.

Vascular: No hyperdense vessel. Atherosclerotic calcifications in
the intracranial carotid and vertebral arteries.

Skull: Renal osteodystrophy.  Negative for fracture or focal lesion.

Sinuses/Orbits: No acute finding.

Other: The mastoid air cells are well aerated.
IMPRESSION: No acute intracranial process.

## 2021-01-24 MED ORDER — LEVETIRACETAM IN NACL 500 MG/100ML IV SOLN
500.0000 mg | Freq: Two times a day (BID) | INTRAVENOUS | Status: DC
  Administered 2021-01-24 – 2021-01-25 (×4): 500 mg via INTRAVENOUS
  Filled 2021-01-24 (×5): qty 100

## 2021-01-24 MED ORDER — LORAZEPAM 2 MG/ML IJ SOLN
INTRAMUSCULAR | Status: AC
  Administered 2021-01-24: 2 mg
  Filled 2021-01-24: qty 1

## 2021-01-24 MED ORDER — LEVETIRACETAM IN NACL 1000 MG/100ML IV SOLN
1000.0000 mg | Freq: Once | INTRAVENOUS | Status: AC
  Administered 2021-01-24: 1000 mg via INTRAVENOUS
  Filled 2021-01-24: qty 100

## 2021-01-24 MED ORDER — NICARDIPINE HCL IN NACL 20-0.86 MG/200ML-% IV SOLN
3.0000 mg/h | INTRAVENOUS | Status: DC
  Administered 2021-01-24: 16:00:00 12.5 mg/h via INTRAVENOUS
  Administered 2021-01-24: 5 mg/h via INTRAVENOUS
  Administered 2021-01-24: 10 mg/h via INTRAVENOUS
  Administered 2021-01-24: 12.5 mg/h via INTRAVENOUS
  Administered 2021-01-25: 3 mg/h via INTRAVENOUS
  Administered 2021-01-25: 7.5 mg/h via INTRAVENOUS
  Administered 2021-01-25: 15 mg/h via INTRAVENOUS
  Administered 2021-01-25: 5 mg/h via INTRAVENOUS
  Filled 2021-01-24 (×8): qty 200

## 2021-01-24 MED ORDER — CALCITRIOL 0.5 MCG PO CAPS
1.2500 ug | ORAL_CAPSULE | ORAL | Status: DC
  Filled 2021-01-24: qty 1

## 2021-01-24 MED ORDER — LAMIVUDINE 10 MG/ML PO SOLN
50.0000 mg | Freq: Every day | ORAL | Status: DC
  Filled 2021-01-24 (×4): qty 5

## 2021-01-24 MED ORDER — LORAZEPAM 2 MG/ML IJ SOLN
2.0000 mg | INTRAMUSCULAR | Status: DC | PRN
  Filled 2021-01-24: qty 1

## 2021-01-24 MED ORDER — CHLORHEXIDINE GLUCONATE CLOTH 2 % EX PADS
6.0000 | MEDICATED_PAD | Freq: Every day | CUTANEOUS | Status: DC
  Administered 2021-01-24: 6 via TOPICAL

## 2021-01-24 MED ORDER — METOPROLOL TARTRATE 50 MG PO TABS: 50.0000 mg | ORAL_TABLET | Freq: Two times a day (BID) | ORAL | Status: DC

## 2021-01-24 MED ORDER — CHLORHEXIDINE GLUCONATE CLOTH 2 % EX PADS
6.0000 | MEDICATED_PAD | Freq: Every day | CUTANEOUS | Status: DC
  Administered 2021-01-25 – 2021-01-27 (×3): 6 via TOPICAL

## 2021-01-24 MED ORDER — HYDRALAZINE HCL 20 MG/ML IJ SOLN
INTRAMUSCULAR | Status: AC
  Administered 2021-01-24: 20 mg
  Filled 2021-01-24: qty 1

## 2021-01-24 MED ORDER — HYDRALAZINE HCL 20 MG/ML IJ SOLN
5.0000 mg | Freq: Once | INTRAMUSCULAR | Status: AC
  Administered 2021-01-24: 5 mg via INTRAVENOUS

## 2021-01-24 MED ORDER — LIDOCAINE HCL (PF) 2 % IJ SOLN
INTRAMUSCULAR | Status: AC
  Filled 2021-01-24: qty 10

## 2021-01-24 MED ORDER — CINACALCET HCL 30 MG PO TABS
90.0000 mg | ORAL_TABLET | ORAL | Status: DC
  Administered 2021-01-28 – 2021-02-06 (×4): 90 mg via ORAL
  Filled 2021-01-24 (×7): qty 3

## 2021-01-24 NOTE — Progress Notes (Signed)
Lumbar puncture attempt unsuccessful by radiologist due to patient movement and was unsafe to proceed. Nursing made aware in the ICU. PT returned to inpatient bed assignment at this time on hospital bed.

## 2021-01-24 NOTE — Progress Notes (Signed)
SLP Cancellation Note  Patient Details Name: [name] MRN: [id number] DOB: 30-Jan-1951   Cancelled treatment:       Reason Eval/Treat Not Completed: Patient at procedure or test/unavailable;Fatigue/lethargy limiting ability to participate; nursing reported minimally responsive throughout day/lethargic; imminent transfer to [facility] per report.  ST will continue to f/u when pt able.   Elvina Sidle, M.S., CCC-SLP 01/24/2021, 4:26 PM

## 2021-01-24 NOTE — H&P (Signed)
NAME:  [name], MRN:  [id number], DOB:  [birth year], LOS: 2 ADMISSION DATE:  01/23/2021, CONSULTATION DATE:  11/25 REFERRING MD:  [name] , CHIEF COMPLAINT:  Syncope   History of Present Illness:  Patient is encephalopathic. Therefore history has been obtained from chart review.   [name], is a 70 y.o. 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 She had a syncopal event while on HD. 500cc fluid reported to have been removed. Upon presentation she had ongoing confusion that was intermittent. CTH negative for acute process.  She was admitted to the hospitalist service. On 11/25 the patient had a seizure while on the ward. Neuro consulted. Patient was found to be hypertensive. MRI concerning for PRES. The patient was started on a Cardene GTT. Per report she still remained altered and has not returned to baseline. Neuro has requested transfer to [facility] for LTM eeg.   PCCM was consulted for management while at [facility].   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 11/26 Arrival to [facility]  Interim History / Subjective:  See above.  Unable to obtain subjective evaluation due to patient status  Objective   Blood pressure (!) 102/56, pulse 63, temperature 98.1 F (36.7 C), temperature source Axillary, resp. rate 14, weight 43.4 kg, SpO2 93 %.        Intake/Output Summary (Last 24 hours) at 01/25/2021 0435 Last data filed at  01/25/2021 0200 Gross per 24 hour  Intake 743.1 ml  Output 0 ml  Net 743.1 ml   Filed Weights   01/23/21 1830  Weight: 43.4 kg    Examination: General: In bed, NAD, appears comfortable, frail, chronically ill appearing HEENT: MM pink/moist, anicteric, atraumatic Neuro: RASS -2, PERRL 51m,  eyes open to pain, moves all extremities, incomprehensible speech CV: S1S2, NSR, no m/r/g appreciated, no ST changes PULM:  air movement in all lobes, trachea midline, chest expansion symmetric GI: soft, bsx4 active, non-tender   Extremities: warm/dry, no pretibial edema, capillary refill less than 3 seconds  Skin: L tunneled vas cath, right upper extremity wound   Resolved Hospital Problem list     Assessment & Plan:  Acute encephalopathy with PRES New onset seizures Hypertensive emergency vs malignant HTN 11/25 MRI with cortical and subcortical edema in the occipital parietal lobes BL. Findings may be due to PRES. 11/25 CT with no acute intracranial process. [facility] IR unable to obtain LP. Carotid UKoreawith no significant stenosis. TSH 4.76. Tmax 102.4. Documented BP 195/98 on 11/25. Ammonia 65. ?remains postisctal -Neuro consulted and following.  -SBP goal less than 140 for PRES per neuro note. Continue IV Cardene gtt, and PRN IV labetalol. -Continue keppra 500 BID -[facility] neuro[facility]  -Neuro holding on starting broad-spectrum abx and antivirals until after LP. Patient already on vancomycin and presentation explained by PRES. LP per neuro. -Follow up blood cultures -PRN ativan for seizures  Elevated  TSH and T4  TSH 4.76, T4 1.24. ? Cause of afib -Check prolactin, ACTH, IGF1, TSH, T3/T4 -Start methimazole PO if patient is able to take PO medications. No feeding tube per advance directives.  Paroxysmal atrial flutter In NSR on exam, was previously on eliquis -Continue metoprolol po -Start heparin GTT, resume eliquis when able to take PO medication -Continue tele  MRSA  infection of right AV fistula Was receiving tx with vanc with outpatient dialysis.  -Continue Vanc with HD. Last day 02/12/2021 -[facility] consulted  ESRD on HD (TTS) BUN 35>43, Creatinine 8.31>9.38 -Nephrology following -HD per neph  Hyperkalemia K 6.0>5.8. No EKG changes.  -HD per neph. Scheduled for HD 11/26 -Continue daily labs. -Continue tele  HIV CD4 252 on 11/20/20 -Continue PO antiretroviral therapy with zidovudine, lamivudine, and dolutegravir  Anemia of chronic disease, macrocytic Thrombocytopenia HGB 11.5>10.2>8.7 , PLT 419>622>297. No obvious signs of bleeding. -Transfuse PRBC if HBG less than 7 -Obtain AM CBC to trend H&H -Monitor for signs of bleeding -check b12, folate level -continue B12 supplementation   HX chronic pain -Continue holding fentanyl and methadone in setting of AMS  Severe protein calorie malnutrition Hypoalbuminemia Albumin 3.2 -PO nutrition if able -11/7 PC note states that the patient would not want a feeding tube.   HX GERD -PPI  GOC Palliative care consulted previous admission. 01/06/21 PC discussion with patient states DNR. Limited Additional Interventions: Use medical treatment, IV fluids and cardiac monitoring as indicated, DO NOT USE intubation or mechanical ventilation. May consider use of less invasive airway support such as BiPAP or CPAP. Also provide comfort measures. Transfer to the hospital if indicated. No feeding tube. Avoid intensive care.  -Patient being transferred to ICU at [facility]. Re-engage palliative in AM.  Best Practice (right click and "Reselect all SmartList Selections" daily)   Diet/type: NPO w/ oral meds DVT prophylaxis: SCD GI prophylaxis: PPI Lines: Dialysis Catheter Foley:  N/A Code Status:  DNR/DNI- See PC note on 11/7 Last date of multidisciplinary goals of care discussion [Pending]  Labs   CBC: Recent Labs  Lab 01/23/21 1016 01/24/21 0134 01/24/21 1331 01/25/21 0209  WBC 3.2* 4.9 4.7 3.8*  NEUTROABS  2.4  --  3.8  --   HGB 10.1* 11.5* 10.2* 8.7*  HCT 34.4* 40.1 34.0* 28.8*  MCV 104.2* 107.8* 104.3* 102.1*  PLT 147* 131* 143* 127*    Basic Metabolic Panel: Recent Labs  Lab 01/23/21 1016 01/24/21 0134 01/24/21 1440 01/25/21 0209  NA 137 137 139 140  K 4.3 4.4 6.0* 5.8*  CL 99 98 100 103  CO2 24 17* 24 24  GLUCOSE 79 132* 125* 96  BUN 29* 35* 43* 49*  CREATININE 6.80* 8.31* 9.38* 10.46*  CALCIUM 8.5* 8.7* 8.5* 8.4*  MG  --  2.3  --  2.4  PHOS  --   --   --  8.4*   GFR: Estimated Creatinine Clearance: 3.6 mL/min (A) (by C-G formula based on SCr of 10.46 mg/dL (H)). Recent Labs  Lab 01/23/21 1016 01/24/21 0134 01/24/21 1331 01/25/21 0209  WBC 3.2* 4.9 4.7 3.8*    Liver Function Tests: Recent Labs  Lab 01/23/21 1016 01/24/21 0134 01/24/21 1440 01/25/21 0209  AST _0 --   ALT _1 --   ALKPHOS 87 95 84  --   BILITOT 0.9 0.9 0.5  --   PROT 7.5 8.4* 8.0  --   ALBUMIN 3.0* 3.3* 3.2* 2.8*   No results for input(s):  LIPASE, AMYLASE in the last 168 hours. Recent Labs  Lab 01/23/21 1501 01/24/21 0134  AMMONIA 25 65*    ABG    Component Value Date/Time   PHART 7.426 (H) 01/26/2009 1040   PCO2ART 36.9 01/26/2009 1040   PO2ART 112.0 (H) 01/26/2009 1040   HCO3 24.8 01/24/2021 0612   TCO2 31 11/11/2020 1017   ACIDBASEDEF 7.7 (H) 01/24/2021 0134   O2SAT 97.3 01/24/2021 0612     Coagulation Profile: Recent Labs  Lab 01/24/21 1054  INR 1.2    Cardiac Enzymes: No results for input(s): CKTOTAL, CKMB, CKMBINDEX, TROPONINI in the last 168 hours.  HbA1C: No results found for: HGBA1C  CBG: Recent Labs  Lab 01/23/21 1019 01/24/21 0124 01/24/21 1131  GLUCAP 73 127* 126*    Review of Systems:   Unable to obtain a review of systems due to patient status.  Past Medical History:  She,  has a past medical history of Anemia, Anxiety, Atrial flutter ([place]) (02/22/2015), C. difficile colitis (10/10/2011), Chronic diarrhea, Chronic pain,  Depression, ESRD on hemodialysis ([place]), GERD (gastroesophageal reflux disease), HIV (human immunodeficiency virus infection) ([place]), Hypertension, Insomnia, Intracranial hemorrhage ([place]), Muscle weakness (generalized), Pulmonary HTN ([place]), Tachycardia, TIA (transient ischemic attack), and Traumatic hematoma of right upper arm (02/22/2015).   Surgical History:   Past Surgical History:  Procedure Laterality Date   A/V FISTULAGRAM N/A 04/17/2016   Procedure: A/V Fistulagram - Right Upper;  Surgeon: Waynetta Sandy, MD;  Location: [place] [facility];  Service: Cardiovascular;  Laterality: N/A;   A/V FISTULAGRAM Right 08/28/2019   Procedure: A/V FISTULAGRAM;  Surgeon: Waynetta Sandy, MD;  Location: [place] [facility];  Service: Cardiovascular;  Laterality: Right;   ABDOMINAL HYSTERECTOMY     APPLICATION OF WOUND VAC Right 01/07/2021   Procedure: APPLICATION OF WOUND VAC;  Surgeon: Waynetta Sandy, MD;  Location: [place];  Service: Vascular;  Laterality: Right;   BIOPSY  01/02/2020   Procedure: BIOPSY;  Surgeon: Eloise Harman, DO;  [facility];  Service: Endoscopy;;   BIOPSY  07/11/2020   Procedure: BIOPSY;  Surgeon: Irving Copas., MD;  Location: [place];  Service: Gastroenterology;;   BIOPSY THYROID     CARPAL TUNNEL RELEASE Right 11/16/2019   Procedure: CARPAL TUNNEL RELEASE;  Surgeon: Leanora Cover, MD;  Location: [place];  Service: Orthopedics;  Laterality: Right;   COLONOSCOPY WITH PROPOFOL N/A 01/02/2020   Surgeon: Eloise Harman, DO;nonbleeding internal hemorrhoids, diverticulosis, status post biopsies of the entire colon (benign). Due for repeat in 2026.   Dialysis Shunts     previous one removed from left arm and now present one in right arm   DIALYSIS/PERMA CATHETER INSERTION Right 04/17/2016   Procedure: dialysis Catheter Insertion central veinous;  Surgeon: Waynetta Sandy, MD;  Location: [place] [facility];  Service:  Cardiovascular;  Laterality: Right;   ESOPHAGOGASTRODUODENOSCOPY  12/15/2010   [place]ourk: erosive reflux esophagitis, MW tear, hiatal hernia (small), gastritis with no h.pylori, possibly nsaid related   ESOPHAGOGASTRODUODENOSCOPY (EGD) WITH PROPOFOL N/A 07/11/2020    Surgeon: Irving Copas., MD; normal esophagus, 4 cm hiatal hernia, gastritis biopsied (mild chronic gastritis, negative H. pylori), single duodenal polyp (Brunner's gland hyperplasia), s/p duodenal biopsy benign, duodenal narrowing at the duodenal sweep.   EUS N/A 07/11/2020    Surgeon: Irving Copas., MD;  dilated CBD and common hepatic duct without stones or sludge in the bile duct, stones and biliary sludge in the gallbladder, pancreatic parenchyma with hyperechoic  strands, pancreatic duct dilated with prominent sidebranches in the neck and body and tail, no pancreatic mass though visulalization limited, no ampullary lesion, no malignant appearing lymph node   EXCISION OF BREAST BIOPSY Right 05/04/2012   Procedure: EXCISION OF BREAST BIOPSY;  Surgeon: Donato Heinz, MD;  Location: [facility];  Service: General;  Laterality: Right;  Right Excisional Breast Biopsy   FISTULOGRAM Right 03/26/2014   Procedure: Right Arm Fistulogram with Venoplasty Right Subclavian Vein and Inominate Vein. Debridement Fistula Ulcer;  Surgeon: Conrad Boron, MD;  Location: [place];  Service: Vascular;  Laterality: Right;   FISTULOGRAM Right 03/29/2017   Procedure: FISTULOGRAM COMPLEX RIGHT ARM with Balloon angioplasty;  Surgeon: Waynetta Sandy, MD;  Location: [place];  Service: Vascular;  Laterality: Right;   HEMOSTASIS CLIP PLACEMENT  07/11/2020   Procedure: HEMOSTASIS CLIP PLACEMENT;  Surgeon: Irving Copas., MD;  Location: [place];  Service: Gastroenterology;;   INCISION AND DRAINAGE Right 12/27/2020   Procedure: INCISION AND DRAINAGE OF RIGHT FOREARM;  Surgeon: Waynetta Sandy, MD;  Location: [place];  Service:  Vascular;  Laterality: Right;   INCISION AND DRAINAGE Right 12/31/2020   Procedure: INCISION AND DRAINAGE RIGHT ARM WITH PLACEMNENT OF WOUND VAC;  Surgeon: Waynetta Sandy, MD;  Location: [place];  Service: Vascular;  Laterality: Right;   INCISION AND DRAINAGE Right 01/07/2021   Procedure: RIGHT ARM INCISION AND DRAINAGE;  Surgeon: Waynetta Sandy, MD;  Location: [place];  Service: Vascular;  Laterality: Right;   IR FLUORO GUIDE CV LINE LEFT  12/26/2020   IR FLUORO GUIDE CV LINE LEFT  12/30/2020   IR GENERIC HISTORICAL  05/19/2016   IR REMOVAL TUN CV CATH W/O FL 05/19/2016 Markus Daft, MD [facility]-INTERV RAD   IR US GUIDE VASC ACCESS LEFT  12/26/2020   IR US GUIDE VASC ACCESS LEFT  12/30/2020   LIGATION OF ARTERIOVENOUS  FISTULA Right 11/12/2020   Procedure: LIGATION OF ARTERIOVENOUS  FISTULA WITH EXCISION OF NECROTIC TISSUE;  Surgeon: Waynetta Sandy, MD;  Location: [place];  Service: Vascular;  Laterality: Right;   LIGATION OF ARTERIOVENOUS  FISTULA Right 12/27/2020   Procedure: LIGATION OF ARTERIOVENOUS  FISTULA;  Surgeon: Waynetta Sandy, MD;  Location: [place];  Service: Vascular;  Laterality: Right;   PERIPHERAL VASCULAR BALLOON ANGIOPLASTY Right 04/17/2016   Procedure: Peripheral Vascular Balloon Angioplasty;  Surgeon: Waynetta Sandy, MD;  Location: [place] [facility];  Service: Cardiovascular;  Laterality: Right;  Central segment AV Fistula   PERIPHERAL VASCULAR BALLOON ANGIOPLASTY Right 03/14/2018   Procedure: PERIPHERAL VASCULAR BALLOON ANGIOPLASTY;  Surgeon: Waynetta Sandy, MD;  Location: [place] [facility];  Service: Cardiovascular;  Laterality: Right;  subclavian vein   PERIPHERAL VASCULAR BALLOON ANGIOPLASTY Right 08/28/2019   Procedure: PERIPHERAL VASCULAR BALLOON ANGIOPLASTY;  Surgeon: Waynetta Sandy, MD;  Location: [place] [facility];  Service: Cardiovascular;  Laterality: Right;  upper arm   PERIPHERAL VASCULAR BALLOON  ANGIOPLASTY Right 11/11/2020   Procedure: PERIPHERAL VASCULAR BALLOON ANGIOPLASTY;  Surgeon: Waynetta Sandy, MD;  Location: [place] [facility];  Service: Cardiovascular;  Laterality: Right;   PERIPHERAL VASCULAR INTERVENTION Right 03/14/2018   Procedure: PERIPHERAL VASCULAR INTERVENTION;  Surgeon: Waynetta Sandy, MD;  Location: [place] [facility];  Service: Cardiovascular;  Laterality: Right;  subclavian vein   POLYPECTOMY  07/11/2020   Procedure: POLYPECTOMY;  Surgeon: Mansouraty, [place] Nab., MD;  Location: [place];  Service: Gastroenterology;;   REVISON OF ARTERIOVENOUS FISTULA Right 11/12/2020  Procedure: CREATION OF RIGHT ARM ARTERIOVENOUS FISTULA;  Surgeon: Waynetta Sandy, MD;  Location: [place];  Service: Vascular;  Laterality: Right;   SHUNTOGRAM Right 10/19/2013   Procedure: FISTULOGRAM;  Surgeon: Conrad Tarlton, MD;  Location: [facility] C[facility];  Service: Cardiovascular;  Laterality: Right;   SKIN SPLIT GRAFT Right 01/07/2021   Procedure: MYRIAD SKIN GRAFT PLACEMENT;  Surgeon: Waynetta Sandy, MD;  Location: [place];  Service: Vascular;  Laterality: Right;   TONSILLECTOMY     VISCERAL VENOGRAPHY Right 03/14/2018   Procedure: CENTRAL VENOGRAPHY;  Surgeon: Waynetta Sandy, MD;  Location: [place] [facility];  Service: Cardiovascular;  Laterality: Right;  upper ext fistula     Social History:   reports that she quit smoking about 37 years ago. Her smoking use included cigarettes. She has never used smokeless tobacco. She reports that she does not drink alcohol and does not use drugs.   Family History:  Her family history is negative for Anesthesia problems, Hypotension, Malignant hyperthermia, Pseudochol deficiency, and Colon cancer.   Allergies Allergies  Allergen Reactions   Lactose Intolerance (Gi) Other (See Comments)    On MAR   Latex Rash and Itching   Penicillins Rash and Swelling    Has patient had a PCN reaction causing  immediate rash, facial/tongue/throat swelling, SOB or lightheadedness with hypotension: No Has patient had a PCN reaction causing severe rash involving mucus membranes or skin necrosis: No Has patient had a PCN reaction that required hospitalization No Has patient had a PCN reaction occurring within the last 10 years: No If all of the above answers are "NO", then may proceed with Cephalosporin use.       Home Medications  Prior to Admission medications   Medication Sig Start Date End Date Taking? Authorizing Provider  Amino Acids-Protein Hydrolys (PRO-STAT 64 PO) Take by mouth. Give 45m by mouth in the morning for wound healing   Yes [provider]  apixaban (ELIQUIS) 2.5 MG TABS tablet Take 1 tablet (2.5 mg total) by mouth 2 (two) times daily. 11/27/20  Yes CUlyses Amor PA-C  baclofen (LIORESAL) 10 MG tablet Take 10 mg by mouth daily as needed for muscle spasms.   Yes [provider]  clopidogrel (PLAVIX) 75 MG tablet Take 1 tablet (75 mg total) by mouth daily with breakfast. 07/14/20  Yes Mansouraty, GTelford Nab, MD  collagenase (SANTYL) ointment Apply 1 application topically daily. Apply to sacrum topically every day shift for wound healing   Yes [provider]  cyanocobalamin (,VITAMIN B-12,) 1000 MCG/ML injection Inject 1,000 mcg into the muscle every 30 (thirty) days. On the 28th of every month   Yes [provider]  dolutegravir (TIVICAY) 50 MG tablet Take 50 mg by mouth daily at 4 PM.   Yes [provider]  Emollient (MINERIN) LOTN Apply 1 application topically every 12 (twelve) hours as needed (dry skin). Apply to feet and ankles   Yes [provider]  fentaNYL (DURAGESIC) 25 MCG/HR Place 1 patch onto the skin every 3 (three) days. 01/09/21  Yes LOswald Hillock MD  lamivudine (EPIVIR) 100 MG tablet Take 50 mg by mouth daily. (0800)   Yes [provider]  Lidocaine HCl 4 % CREA Apply 1 application topically every 12  (twelve) hours as needed (pain).   Yes [provider]  lidocaine-prilocaine (EMLA) cream Apply 1 application topically See admin instructions. (0900) Apply to fistula site one hour before dialysis on Tuesday, Thursday, and Saturday.  Yes [provider]  megestrol (MEGACE) 400 MG/10ML suspension Take 400 mg by mouth daily.   Yes [provider]  melatonin 3 MG TABS tablet Take 3 mg by mouth at bedtime. (2000)   Yes [provider]  methadone (DOLOPHINE) 5 MG tablet Take 0.5 tablets (2.5 mg total) by mouth daily at 8 pm. (2000) 01/09/21  Yes Darrick Meigs, Marge Duncans, MD  metoprolol tartrate (LOPRESSOR) 25 MG tablet Take 1 tablet (25 mg total) by mouth 2 (two) times daily. 11/27/20  Yes Ulyses Amor, PA-C  mirtazapine (REMERON) 15 MG tablet Take 15 mg by mouth at bedtime. 12/05/20  Yes [provider]  nitroGLYCERIN (NITROSTAT) 0.4 MG SL tablet Place 0.4 mg under the tongue every 5 (five) minutes x 3 doses as needed for chest pain.    Yes [provider]  ondansetron (ZOFRAN ODT) 8 MG disintegrating tablet Take 1 tablet (8 mg total) by mouth every 8 (eight) hours as needed for nausea or vomiting. 12/16/20  Yes Erenest Rasher, PA-C  Oxycodone HCl 10 MG TABS Take 1 tablet (10 mg total) by mouth every 12 (twelve) hours as needed (severe pain). 01/09/21  Yes Oswald Hillock, MD  pantoprazole (PROTONIX) 40 MG tablet Take 1 tablet (40 mg total) by mouth daily before breakfast. 12/16/20  Yes Erenest Rasher, PA-C  promethazine (PHENERGAN) 12.5 MG tablet Take 12.5 mg by mouth See admin instructions. Take 1 tablet (12.5 mg) by mouth in the morning every Tuesday, Thursday and Saturday for nausea/vomiting and every 6 hours as needed for n/v   Yes [provider]  senna (SENOKOT) 8.6 MG tablet Take 2 tablets by mouth every evening. (2100)   Yes [provider]  vancomycin (VANCOCIN) 500-5 MG/100ML-% IVPB Inject 100 mLs (500 mg total) into the vein  Every Tuesday,Thursday,and Saturday with dialysis. 01/11/21 02/12/21 Yes Oswald Hillock, MD  venlafaxine XR (EFFEXOR-XR) 150 MG 24 hr capsule Take 150 mg by mouth daily with breakfast. (0900)   Yes [provider]  vitamin C (ASCORBIC ACID) 500 MG tablet Take 500 mg by mouth 2 (two) times daily.   Yes [provider]  zidovudine (RETROVIR) 100 MG capsule Take 100 mg by mouth 3 (three) times daily. (0900, 1300, & 2100)   Yes [provider]  Zinc Sulfate (ZINC-220 PO) Take 1 tablet by mouth daily.   Yes [provider]  Multiple Vitamin (MULTIVITAMIN WITH MINERALS) TABS tablet Take 1 tablet by mouth at bedtime. (2000)    [provider]     Critical care time: 39 minutes     [employer] MSN, APRN, AGACNP-BC [facility]  01/25/2021 , 4:38 AM  Please see [email or web address] for pager details  If no response, please call [phone] After hours, please call Elink at [phone]

## 2021-01-24 NOTE — Progress Notes (Signed)
EEG complete - results pending 

## 2021-01-24 NOTE — Consult Note (Signed)
[facility] Renal Consultation Note  Requesting MD: Heath Lark, DO Indication for Consultation:  ESRD  Chief complaint: passed out at HD  HPI:  [name] is a 70 y.o. female with a history of end-stage renal disease ([facility]), history of atrial flutter, depression, HIV, hypertension, pulmonary hypertension, and prior intracranial hemorrhage who presented to the hospital after syncopal episode at dialysis on Thursday, 11/24.  She also had some confusion.  Per charting, 0.5 kg removed before HD was stopped.  She has a history of recent right AV fistula infection and thus has been on vancomycin per report.  She was febrile here up to 102.4 per nursing note on 11/25.  Per overnight charting note rapid response was called for vomiting and mental status change.  She also had a witnessed seizure after that episode and was felt to possibly been postictal.  She received Ativan and Keppra.  She is currently at a SNF.  Per outpatient HD RN, she didn't look well on presentation to HD unit yesterday.  Her BP was elevated to 190/100 yesterday.  She dropped to 329 systolic and then went unresponsive to verbal stimuli and vomited.  She had a pulse and they were able to get her to respond to sternal rub.  She was taken off of the machine. Then BP went back up to 191 systolic. She had 0.5 kg UF yesterday off before stopping.  She has been getting vanc 500 mg with HD but unfortunately has missed several doses due to noncompliance - missing treatments or signing off early.  PMHx:   Past Medical History:  Diagnosis Date   Anemia    Anxiety    Atrial flutter ([place]) 02/22/2015   C. difficile colitis 10/10/2011   Chronic diarrhea    Chronic pain    Depression    ESRD on hemodialysis [facility])    Dialysis [name]   GERD (gastroesophageal reflux disease)    HIV (human immunodeficiency virus infection) ([place])    Hypertension    Insomnia    Intracranial hemorrhage (H[facility])    Muscle weakness  (generalized)    Pulmonary HTN (H[facility])    Tachycardia    TIA (transient ischemic attack)    [facility] do not have this dx   Traumatic hematoma of right upper arm 02/22/2015    Past Surgical History:  Procedure Laterality Date   A/V FISTULAGRAM N/A 04/17/2016   Procedure: A/V Fistulagram - Right Upper;  Surgeon: Waynetta Sandy, MD;  Location: [place] [facility];  Service: Cardiovascular;  Laterality: N/A;   A/V FISTULAGRAM Right 08/28/2019   Procedure: A/V FISTULAGRAM;  Surgeon: Waynetta Sandy, MD;  Location: [place] [facility];  Service: Cardiovascular;  Laterality: Right;   ABDOMINAL HYSTERECTOMY     APPLICATION OF WOUND VAC Right 01/07/2021   Procedure: APPLICATION OF WOUND VAC;  Surgeon: Waynetta Sandy, MD;  Location: [place];  Service: Vascular;  Laterality: Right;   BIOPSY  01/02/2020   Procedure: BIOPSY;  Surgeon: Eloise Harman, DO;  [facility];  Service: Endoscopy;;   BIOPSY  07/11/2020   Procedure: BIOPSY;  Surgeon: Irving Copas., MD;  Location: [place];  Service: Gastroenterology;;   BIOPSY THYROID     CARPAL TUNNEL RELEASE Right 11/16/2019   Procedure: CARPAL TUNNEL RELEASE;  Surgeon: Leanora Cover, MD;  Location: [place];  Service: Orthopedics;  Laterality: Right;   COLONOSCOPY WITH PROPOFOL N/A 01/02/2020   Surgeon: Eloise Harman,  DO;nonbleeding internal hemorrhoids, diverticulosis, status post biopsies of the entire colon (benign). Due for repeat in 2026.   Dialysis Shunts     previous one removed from left arm and now present one in right arm   DIALYSIS/PERMA CATHETER INSERTION Right 04/17/2016   Procedure: dialysis Catheter Insertion central veinous;  Surgeon: Waynetta Sandy, MD;  Location: [place] [facility];  Service: Cardiovascular;  Laterality: Right;   ESOPHAGOGASTRODUODENOSCOPY  12/15/2010   Rourk: erosive reflux esophagitis, MW tear, hiatal hernia (small), gastritis with no  h.pylori, possibly nsaid related   ESOPHAGOGASTRODUODENOSCOPY (EGD) WITH PROPOFOL N/A 07/11/2020    Surgeon: Irving Copas., MD; normal esophagus, 4 cm hiatal hernia, gastritis biopsied (mild chronic gastritis, negative H. pylori), single duodenal polyp (Brunner's gland hyperplasia), s/p duodenal biopsy benign, duodenal narrowing at the duodenal sweep.   EUS N/A 07/11/2020    Surgeon: Irving Copas., MD;  dilated CBD and common hepatic duct without stones or sludge in the bile duct, stones and biliary sludge in the gallbladder, pancreatic parenchyma with hyperechoic strands, pancreatic duct dilated with prominent sidebranches in the neck and body and tail, no pancreatic mass though visulalization limited, no ampullary lesion, no malignant appearing lymph node   EXCISION OF BREAST BIOPSY Right 05/04/2012   Procedure: EXCISION OF BREAST BIOPSY;  Surgeon: Donato Heinz, MD;  Location: [facility];  Service: General;  Laterality: Right;  Right Excisional Breast Biopsy   FISTULOGRAM Right 03/26/2014   Procedure: Right Arm Fistulogram with Venoplasty Right Subclavian Vein and Inominate Vein. Debridement Fistula Ulcer;  Surgeon: Conrad Sterlington, MD;  Location: [place];  Service: Vascular;  Laterality: Right;   FISTULOGRAM Right 03/29/2017   Procedure: FISTULOGRAM COMPLEX RIGHT ARM with Balloon angioplasty;  Surgeon: Waynetta Sandy, MD;  Location: [place];  Service: Vascular;  Laterality: Right;   HEMOSTASIS CLIP PLACEMENT  07/11/2020   Procedure: HEMOSTASIS CLIP PLACEMENT;  Surgeon: Irving Copas., MD;  Location: [place];  Service: Gastroenterology;;   INCISION AND DRAINAGE Right 12/27/2020   Procedure: INCISION AND DRAINAGE OF RIGHT FOREARM;  Surgeon: Waynetta Sandy, MD;  Location: [place];  Service: Vascular;  Laterality: Right;   INCISION AND DRAINAGE Right 12/31/2020   Procedure: INCISION AND DRAINAGE RIGHT ARM WITH PLACEMNENT OF WOUND VAC;  Surgeon: Waynetta Sandy, MD;  Location: [place];  Service: Vascular;  Laterality: Right;   INCISION AND DRAINAGE Right 01/07/2021   Procedure: RIGHT ARM INCISION AND DRAINAGE;  Surgeon: Waynetta Sandy, MD;  Location: [place];  Service: Vascular;  Laterality: Right;   IR FLUORO GUIDE CV LINE LEFT  12/26/2020   IR FLUORO GUIDE CV LINE LEFT  12/30/2020   IR GENERIC HISTORICAL  05/19/2016   IR REMOVAL TUN CV CATH W/O FL 05/19/2016 Markus Daft, MD [facility]-INTERV RAD   IR US GUIDE VASC ACCESS LEFT  12/26/2020   IR US GUIDE VASC ACCESS LEFT  12/30/2020   LIGATION OF ARTERIOVENOUS  FISTULA Right 11/12/2020   Procedure: LIGATION OF ARTERIOVENOUS  FISTULA WITH EXCISION OF NECROTIC TISSUE;  Surgeon: Waynetta Sandy, MD;  Location: [place];  Service: Vascular;  Laterality: Right;   LIGATION OF ARTERIOVENOUS  FISTULA Right 12/27/2020   Procedure: LIGATION OF ARTERIOVENOUS  FISTULA;  Surgeon: Waynetta Sandy, MD;  Location: [place];  Service: Vascular;  Laterality: Right;   PERIPHERAL VASCULAR BALLOON ANGIOPLASTY Right 04/17/2016   Procedure: Peripheral Vascular Balloon Angioplasty;  Surgeon: Waynetta Sandy, MD;  Location: [place] [facility];  Service: Cardiovascular;  Laterality: Right;  Central segment AV Fistula   PERIPHERAL VASCULAR BALLOON ANGIOPLASTY Right 03/14/2018   Procedure: PERIPHERAL VASCULAR BALLOON ANGIOPLASTY;  Surgeon: Waynetta Sandy, MD;  Location: [place] [facility];  Service: Cardiovascular;  Laterality: Right;  subclavian vein   PERIPHERAL VASCULAR BALLOON ANGIOPLASTY Right 08/28/2019   Procedure: PERIPHERAL VASCULAR BALLOON ANGIOPLASTY;  Surgeon: Waynetta Sandy, MD;  Location: [place] [facility];  Service: Cardiovascular;  Laterality: Right;  upper arm   PERIPHERAL VASCULAR BALLOON ANGIOPLASTY Right 11/11/2020   Procedure: PERIPHERAL VASCULAR BALLOON ANGIOPLASTY;  Surgeon: Waynetta Sandy, MD;  Location: [place] [facility];  Service:  Cardiovascular;  Laterality: Right;   PERIPHERAL VASCULAR INTERVENTION Right 03/14/2018   Procedure: PERIPHERAL VASCULAR INTERVENTION;  Surgeon: Waynetta Sandy, MD;  Location: [place] [facility];  Service: Cardiovascular;  Laterality: Right;  subclavian vein   POLYPECTOMY  07/11/2020   Procedure: POLYPECTOMY;  Surgeon: Mansouraty, Telford Nab., MD;  Location: [facility] ENDOSCOPY;  Service: Gastroenterology;;   REVISON OF ARTERIOVENOUS FISTULA Right 11/12/2020   Procedure: CREATION OF RIGHT ARM ARTERIOVENOUS FISTULA;  Surgeon: Waynetta Sandy, MD;  Location: [place];  Service: Vascular;  Laterality: Right;   SHUNTOGRAM Right 10/19/2013   Procedure: FISTULOGRAM;  Surgeon: Conrad Wimberley, MD;  Location: [facility] C[facility];  Service: Cardiovascular;  Laterality: Right;   SKIN SPLIT GRAFT Right 01/07/2021   Procedure: MYRIAD SKIN GRAFT PLACEMENT;  Surgeon: Waynetta Sandy, MD;  Location: [place];  Service: Vascular;  Laterality: Right;   TONSILLECTOMY     VISCERAL VENOGRAPHY Right 03/14/2018   Procedure: CENTRAL VENOGRAPHY;  Surgeon: Waynetta Sandy, MD;  Location: 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 Her smoking use included cigarettes. She has never used smokeless tobacco. She reports that she does not drink alcohol and does not use drugs.  Allergies:  Allergies  Allergen Reactions   Lactose Intolerance (Gi) Other (See Comments)    On MAR   Latex Rash and Itching   Penicillins Rash and Swelling    Has patient had a PCN reaction causing immediate rash, facial/tongue/throat swelling, SOB or  lightheadedness with hypotension: No Has patient had a PCN reaction causing severe rash involving mucus membranes or skin necrosis: No Has patient had a PCN reaction that required hospitalization No Has patient had a PCN reaction occurring within the last 10 years: No If all of the above answers are "NO", then may proceed with Cephalosporin use.      Medications: Prior to Admission medications   Medication Sig Start Date End Date Taking? Authorizing Provider  Amino Acids-Protein Hydrolys (PRO-STAT 64 PO) Take by mouth. Give 55m by mouth in the morning for wound healing   Yes [provider]  apixaban (ELIQUIS) 2.5 MG TABS tablet Take 1 tablet (2.5 mg total) by mouth 2 (two) times daily. 11/27/20  Yes CUlyses Amor PA-C  baclofen (LIORESAL) 10 MG tablet Take 10 mg by mouth daily as needed for muscle spasms.  Yes [provider]  clopidogrel (PLAVIX) 75 MG tablet Take 1 tablet (75 mg total) by mouth daily with breakfast. 07/14/20  Yes Mansouraty, Telford Nab., MD  collagenase (SANTYL) ointment Apply 1 application topically daily. Apply to sacrum topically every day shift for wound healing   Yes [provider]  cyanocobalamin (,VITAMIN B-12,) 1000 MCG/ML injection Inject 1,000 mcg into the muscle every 30 (thirty) days. On the 28th of every month   Yes [provider]  dolutegravir (TIVICAY) 50 MG tablet Take 50 mg by mouth daily at 4 PM.   Yes [provider]  Emollient (MINERIN) LOTN Apply 1 application topically every 12 (twelve) hours as needed (dry skin). Apply to feet and ankles   Yes [provider]  fentaNYL (DURAGESIC) 25 MCG/HR Place 1 patch onto the skin every 3 (three) days. 01/09/21  Yes Oswald Hillock, MD  lamivudine (EPIVIR) 100 MG tablet Take 50 mg by mouth daily. (0800)   Yes [provider]  Lidocaine HCl 4 % CREA Apply 1 application topically every 12 (twelve) hours as needed (pain).   Yes [provider]   lidocaine-prilocaine (EMLA) cream Apply 1 application topically See admin instructions. (0900) Apply to fistula site one hour before dialysis on Tuesday, Thursday, and Saturday.   Yes [provider]  megestrol (MEGACE) 400 MG/10ML suspension Take 400 mg by mouth daily.   Yes [provider]  melatonin 3 MG TABS tablet Take 3 mg by mouth at bedtime. (2000)   Yes [provider]  methadone (DOLOPHINE) 5 MG tablet Take 0.5 tablets (2.5 mg total) by mouth daily at 8 pm. (2000) 01/09/21  Yes Darrick Meigs, Marge Duncans, MD  metoprolol tartrate (LOPRESSOR) 25 MG tablet Take 1 tablet (25 mg total) by mouth 2 (two) times daily. 11/27/20  Yes Ulyses Amor, PA-C  mirtazapine (REMERON) 15 MG tablet Take 15 mg by mouth at bedtime. 12/05/20  Yes [provider]  nitroGLYCERIN (NITROSTAT) 0.4 MG SL tablet Place 0.4 mg under the tongue every 5 (five) minutes x 3 doses as needed for chest pain.    Yes [provider]  ondansetron (ZOFRAN ODT) 8 MG disintegrating tablet Take 1 tablet (8 mg total) by mouth every 8 (eight) hours as needed for nausea or vomiting. 12/16/20  Yes Erenest Rasher, PA-C  Oxycodone HCl 10 MG TABS Take 1 tablet (10 mg total) by mouth every 12 (twelve) hours as needed (severe pain). 01/09/21  Yes Oswald Hillock, MD  pantoprazole (PROTONIX) 40 MG tablet Take 1 tablet (40 mg total) by mouth daily before breakfast. 12/16/20  Yes Erenest Rasher, PA-C  promethazine (PHENERGAN) 12.5 MG tablet Take 12.5 mg by mouth See admin instructions. Take 1 tablet (12.5 mg) by mouth in the morning every Tuesday, Thursday and Saturday for nausea/vomiting and every 6 hours as needed for n/v   Yes [provider]  senna (SENOKOT) 8.6 MG tablet Take 2 tablets by mouth every evening. (2100)   Yes [provider]  vancomycin (VANCOCIN) 500-5 MG/100ML-% IVPB Inject 100 mLs (500 mg total) into the vein Every Tuesday,Thursday,and Saturday with dialysis. 01/11/21  02/12/21 Yes Oswald Hillock, MD  venlafaxine XR (EFFEXOR-XR) 150 MG 24 hr capsule Take 150 mg by mouth daily with breakfast. (0900)   Yes [provider]  vitamin C (ASCORBIC ACID) 500 MG tablet Take 500 mg by mouth 2 (two) times daily.   Yes [provider]  zidovudine (RETROVIR) 100 MG capsule Take  100 mg by mouth 3 (three) times daily. (0900, 1300, & 2100)   Yes [provider]  Zinc Sulfate (ZINC-220 PO) Take 1 tablet by mouth daily.   Yes [provider]  Multiple Vitamin (MULTIVITAMIN WITH MINERALS) TABS tablet Take 1 tablet by mouth at bedtime. (2000)    [provider]    I have reviewed the patient's current and reported prior to admission medications.  Labs:  BMP Latest Ref Rng & Units 01/24/2021 01/23/2021 01/09/2021  Glucose 70 - 99 mg/dL 132(H) 79 92  BUN 8 - 23 mg/dL 35(H) 29(H) 33(H)  Creatinine 0.44 - 1.00 mg/dL 8.31(H) 6.80(H) 6.37(H)  BUN/Creat Ratio 6 - 22 (calc) - - -  Sodium 135 - 145 mmol/L 137 137 135  Potassium 3.5 - 5.1 mmol/L 4.4 4.3 4.5  Chloride 98 - 111 mmol/L 98 99 100  CO2 22 - 32 mmol/L 17(L) 24 27  Calcium 8.9 - 10.3 mg/dL 8.7(L) 8.5(L) 8.5(L)    ROS:    Unable to be obtained 2/2 AMS  Physical Exam: Vitals:   01/24/21 0935 01/24/21 1031  BP: (!) 169/96 (!) 187/118  Pulse: 79 79  Resp: (!) 28 (!) 24  Temp: (!) 100.4 F (38 C)   SpO2: 100% 100%     General: elderly female in bed  HEENT: NCAT Eyes: eyes closed and does not wake with exam  Neck: supple trachea midline Heart: S1S2 no rub Lungs:clear to auscultation with transmitted upper airway sounds; on 1 liter nasal cannula Abdomen: soft/nt/nd Extremities: RUE old AVF unbandaged with packing in two wound locations Skin: no rash on extremities exposed Neuro: does not follow commands or wake with exam Access LIJ tunn catheter   Outpatient HD orders:  [name] TTS EDW 41.5 kg [place] 2K/2.5 ca BF 350  DF 500 Time - 180 minutes  Access  catheter  Medications - out of hectorol and now on calcitriol - 1.25 mcg each tx. Sensipar 90 mg three times a week; Epogen 3600 units each tx Heparin 1000 units bolus then 500 units per hour Has been on vanc 500 mg each tx (though misses often)  Post weight from 11/22 was 43.4 kg; pre-weight from yesterday 46.3 kg  Assessment/Plan:  # ESRD - TTS schedule - no acute indication for HD today - plan for HD tomorrow - optimize seizure as below    - renal panel daily ordered through Monday, 11/28  # Seizure - per primary team and agree with consulting neurology - per team teleneurology is to assess for now   # Encephalopathy - post ictal?  - neurology consulted - team states they are getting MRI and they are planning for LP as well   # Infection of old right forearm AVF.  Now with fever.   - Abx per primary team.  Note she has been missing outpatient Vanc due to signing off early from HD or missing HD completely. S/p ligation and debridement on 10/28.  Previously planned for vanc through Dec 14th but will need to reassess end date with noncompliance - please communicate specifically with nephrology if antibiotics are requested with outpatient HD to include dose and end date  - wound care per primary team   # Hypertension - optimize volume with HD  - on beta blocker with hx of afib/flutter - increase metoprolol tartrate to 50 mg BID when taking PO and has IV meds in for now  # Anemia CKD - hold ESA for now and trend  #  Metabolic bone disease - on calcitriol and sensipar outpatient - resume here and please do not include these medications on her discharge summary as they are given at dialysis  - renal panel in am  # Hx afib/flutter  - noted - Per primary team   # HIV  - regimen per primary team   Disposition - continue inpatient monitoring.  Per the team pending neurology's eval she may be transferred to [facility].   Claudia Desanctis 01/24/2021, 11:35 AM

## 2021-01-24 NOTE — Progress Notes (Signed)
Rapid response called at approximately 0120.  Patient found by nurse to be in respiratory distress with difficulty breathing and not responding to stimuli.  Patient was placed on non rebreather, and accucheck and blood pressure taken.  MD responded and labs were taken and patient sent down for stat CT of  head.  Before transfer to CT  patient had seizure like activity.  MD notified and medications given per MD order. Notified daughter of change in patient condition.  She verbalized understanding of situation and no questions at this time.

## 2021-01-24 NOTE — Progress Notes (Signed)
   01/24/21 0830  Assess: MEWS Score  Temp (!) 102.4 F (39.1 C)  Resp (!) 28  Level of Consciousness Responds to Pain  SpO2 96 %  O2 Device Nasal Cannula  O2 Flow Rate (L/min) 2 L/min  Assess: MEWS Score  MEWS Temp 2  MEWS Systolic 0  MEWS Pulse 1  MEWS RR 2  MEWS LOC 2  MEWS Score 7  MEWS Score Color Red  Assess: if the MEWS score is Yellow or Red  Were vital signs taken at a resting state? Yes  Focused Assessment Change from prior assessment (see assessment flowsheet)  Early Detection of Sepsis Score *See Row Information* High  MEWS guidelines implemented *See Row Information* Yes  Treat  MEWS Interventions Administered prn meds/treatments  Pain Scale Faces  Faces Pain Scale 0  Take Vital Signs  Increase Vital Sign Frequency  Red: Q 1hr X 4 then Q 4hr X 4, if remains red, continue Q 4hrs  Escalate  MEWS: Escalate Red: discuss with charge nurse/RN and provider, consider discussing with RRT  Notify: Charge Nurse/RN  Name of Charge Nurse/RN Notified Emiliano Dyer RN  Date Charge Nurse/RN Notified 01/24/21  Time Charge Nurse/RN Notified 3810  Notify: Provider  Provider Name/Title Dr. Manuella Ghazi  Date Provider Notified 01/24/21  Time Provider Notified [phone]  Notification Type Page  Notification Reason Change in status;Other (Comment) (red MEWS)  Provider response At bedside  Date of Provider Response 01/24/21  Time of Provider Response [phone]  Document  Patient Outcome Other (Comment) (recheck VS and re-evaluate)

## 2021-01-24 NOTE — Progress Notes (Signed)
   01/24/21 0124  Assess: MEWS Score  BP (!) 224/116  Pulse Rate 90  Resp (!) 26  SpO2 100 %  Assess: MEWS Score  MEWS Temp 0  MEWS Systolic 2  MEWS Pulse 0  MEWS RR 2  MEWS LOC 0  MEWS Score 4  MEWS Score Color Red  Assess: if the MEWS score is Yellow or Red  Were vital signs taken at a resting state? Yes  Focused Assessment Change from prior assessment (see assessment flowsheet)  Early Detection of Sepsis Score *See Row Information* Low  MEWS guidelines implemented *See Row Information* Yes  Treat  MEWS Interventions Administered prn meds/treatments;Escalated (See documentation below);Consulted Respiratory Therapy;Other (Comment) (rapid response, pt experiencing resp distress, unresponsive to sternal rub/stimuli)  Take Vital Signs  Increase Vital Sign Frequency  Red: Q 1hr X 4 then Q 4hr X 4, if remains red, continue Q 4hrs  Escalate  MEWS: Escalate Red: discuss with charge nurse/RN and provider, consider discussing with RRT  Notify: Charge Nurse/RN  Name of Charge Nurse/RN Notified bree johnson, RN  Date Charge Nurse/RN Notified 01/24/21  Time Charge Nurse/RN Notified 0124  Notify: Provider  Provider Name/Title dr zierle-ghosh  Date Provider Notified 01/24/21  Time Provider Notified 0125  Notification Type Page  Notification Reason Change in status  Provider response At bedside;See new orders  Date of Provider Response 01/24/21  Time of Provider Response 0126  Notify: Rapid Response  Name of Rapid Response RN Notified Birdie Riddle, RN  Date Rapid Response Notified 01/24/21  Time Rapid Response Notified 0126  Document  Patient Outcome Other (Comment);Not stable and remains on department (awaiting results of head CT, labs)

## 2021-01-24 NOTE — Progress Notes (Signed)
Patient transferred to ICU for higher level of care.

## 2021-01-24 NOTE — Procedures (Signed)
Patient Name: [name]  MRN: [id number]  Epilepsy Attending: Lora Havens  Referring Physician/Provider: Dr Heath Lark Date: 01/24/2021 Duration: 23.35 mins  Patient history: 70 year old female who presented with altered mental status, fever and seizure.  EEG to evaluate for seizure.  Level of alertness:  lethargic   AEDs during EEG study: LEV  Technical aspects: This EEG study was done with scalp electrodes positioned according to the 10-20 International system of electrode placement. Electrical activity was acquired at a sampling rate of 500Hz  and reviewed with a high frequency filter of 70Hz  and a low frequency filter of 1Hz . EEG data were recorded continuously and digitally stored.   Description: No posterior dominant rhythm was seen.  EEG showed continuous generalized polymorphic sharply contoured 3 to 6 Hz theta-delta slowing. Generalized periodic discharges with triphasic morphology were noted with fluctuation frequency between 0.5- 1.5 Hz were also noted. Hyperventilation and photic stimulation were not performed.     ABNORMALITY - Periodic discharges with triphasic morphology, generalized ( GPDs) - Continuous slow, generalized  IMPRESSION: This study showed generalized periodic discharges with triphasic morphology which is on the ictal-interictal continuum and low potential for seizure recurrence. This eeg pattern can also be seen due to toxic-metabolic causes like uremia, hyperammonemia.   Additionally eeg is suggestive of moderate diffuse encephalopathy, nonspecific etiology. No seizures or epileptiform discharges were seen throughout the recording.  [name] Barbra Sarks

## 2021-01-24 NOTE — Progress Notes (Signed)
Rapid response called for vomiting and mental status change. Patient did have witnessed seizure after the rapid response, which means the initial change in mental status was likely post ictal. VBG is acidotic which is also consistent with seizure. She had witnessed convulsions for approx 1 minute. Ativan did abort the convulsions. CO2 was also elevated, but BiPAP is contraindicated with vomiting and altered mental status. Loaded with Keppra. EEG is already ordered for AM. Will trend VBG in the AM. Continue to monitor.

## 2021-01-24 NOTE — Progress Notes (Signed)
Patient has remained responsive to pain throughout this shift, fluctuating between red and yellow MEWS. Dr. Manuella Ghazi made aware and at bedside to evaluate. Neurology evaluated via tele-neuro with this RN at bedside. Carotid US completed, awaiting MRI completion.

## 2021-01-24 NOTE — Progress Notes (Addendum)
At [place] while rounding on patient, pt was found drooling with agonal respirations. Pt not responding to stimuli. She had not responded to stimuli since begining of shift but could open eyes when called. Now doesn't even open eyes. Rapid response initiated. MD, respiratory therapy and nursing responded. PT found ho have elevated Bps, medication was given per orders. Pt started having seizure like activity that lasted almost 30 min. MD notified and Ativan given per order. Pt taken to CT scan and back to room. Daughter notified by Agricultural consultant.

## 2021-01-24 NOTE — TOC Initial Note (Signed)
Transition of Care [facility]) - Initial/Assessment Note    Patient Details  Name: [name] MRN: [id number] Date of Birth: 01-05-1951  Transition of Care [facility]) CM/SW Contact:    Ihor Gully, LCSW Phone Number: 01/24/2021, 3:34 PM  Clinical Narrative:                 Patient is a LTC, total care patient at [facility]. Well known to [place]. Most recently admitted and assessed three weeks ago.  TOC will follow for needs/return to facility when medically stable.     Barriers to Discharge: Continued Medical Work up   Patient Goals and CMS Choice Patient states their goals for this hospitalization and ongoing recovery are:: Patient is LTC at [facility]      Expected Discharge Plan and [place] Choice: [place] arrangements for the past 2 months: [place]                                      Prior Living Arrangements/Services Living arrangements for the past 2 months: [place] Lives with:: Facility Resident   Do you feel safe going back to the place where you live?: Yes               Activities of Daily Living      Permission Sought/Granted                  Emotional Assessment              Admission diagnosis:  Acute delirium [R41.0] Syncope [R55] Acute encephalopathy [G93.40] Syncope, unspecified syncope type [R55] Patient Active Problem List   Diagnosis Date Noted   Acute encephalopathy 01/23/2021   Hypoglycemia 12/25/2020   Infected fluid collection with fistula 12/24/2020   Dysphagia 12/16/2020   Loss of weight 12/16/2020   Abdominal pain, epigastric 12/16/2020   Protein-calorie malnutrition, severe 11/20/2020   Chest pain of uncertain etiology    Altered mental status    ESRD (end stage renal disease) ([place]) 11/11/2020   Pancreatic abnormality 08/05/2020   Gastritis without bleeding 08/05/2020   Dilated pancreatic duct 05/15/2020   Abnormal  MRI of abdomen 05/15/2020   Common bile duct dilation    Lobar pneumonia ([place]) 07/20/2019   Acute respiratory failure with hypoxia ([place]) 07/20/2019   Colitis 07/19/2019   Dilation of common bile duct 07/19/2019   Ventral hernia without obstruction or gangrene    Hypocalcemia 03/07/2019   Other pancytopenia ([place]) 12/08/2018   Anemia of chronic disease 04/17/2016   Hepatitis B immune 01/28/2016   Atrial flutter ([place]) 02/22/2015   Hyperkalemia 02/20/2015   Venous stenosis of right upper extremity 04/06/2014   ESRD on dialysis ([place]) 04/06/2014   Essential hypertension    Nausea and vomiting 12/15/2012   CHF, chronic ([place]) 11/25/2012   Coronary artery disease 09/15/2012   History of stroke 09/15/2012   Anxiety 09/15/2012   Atrial fibrillation ([place]) 05/02/2012   Goiter 04/08/2012   Intracranial bleed ([place]) 12/28/2011   Angioedema of lips 09/21/2011   Cellulitis of breast 03/09/2011   Erosive esophagitis 12/15/2010   Mallory - Weiss tear 12/15/2010   UGI bleed 12/13/2010   DIARRHEA 04/12/2009   Human immunodeficiency virus (HIV) disease ([place]) 11/15/2008   PANCREATITIS, HX OF 11/15/2008   TOBACCO USE, QUIT 11/15/2008   PAIN IN JOINT,  UPPER ARM 08/07/2008   ABDOMINAL WALL HERNIA 04/26/2007   BRONCHITIS, ACUTE 11/16/2006   HYPOKALEMIA, HX OF 07/13/2006   DEPRESSION 03/23/2006   Essential hypertension, malignant 03/23/2006   Gastroesophageal reflux disease 03/23/2006   PANCREATITIS 03/23/2006   MASS, RIGHT AXILLA 03/23/2006   HEADACHE 03/23/2006   SYMPTOM, ENLARGEMENT, LYMPH NODES 03/23/2006   SHINGLES, HX OF 03/23/2006   HYSTERECTOMY, TOTAL, HX OF 03/23/2006   PCP:  Hilbert Corrigan, MD Pharmacy:   [place], [place] - [address] [place] [address] Phone: [phone] Fax: [phone]     Social Determinants of Health (SDOH) Interventions    Readmission Risk Interventions Readmission Risk Prevention Plan  10/22/2019  Transportation Screening Complete  PCP or Specialist Appt within 3-5 Days Not Complete  Not Complete comments SNF MD to follow  [place] or [place] Not Complete  HRI or Home Care Consult comments Pt resides in a SNF  Social Work Consult for [place] Planning/Counseling [place] Not Applicable  Medication Review Press photographer) Complete  Some recent data might be hidden

## 2021-01-24 NOTE — Consult Note (Addendum)
I connected with  [name] on 01/24/21 by a video enabled telemedicine application and verified that I am speaking with the correct person using two identifiers.   I discussed the limitations of evaluation and management by telemedicine. The patient is confused and unable to express understanding and agreed to proceed.  Location of patient: [facility] Location of physician: [facility]  Neurology Consultation Reason for Consult: syncope Referring Physician: Dr Heath Lark  CC: syncope  History is obtained from: chart review as patient is confused and unable to provide any history  HPI: [name] is a 70 y.o. female with medical history of end-stage renal disease on HD TTS, HIV, atrial flutter on Eliquis, anemia of chronic disease, chronic  pain, recent MRSA infection involving right AV fistula status post washout currently on vancomycin with wound VAC who presented after an episode of alteration of awareness.    Per ED triage note patient was at dialysis when she had a syncopal event.  Patient reported hurting all over and then passed out.  Per dialysis center staff, about 500 cc of fluid was taken off before the event.  On arrival to the ED, patient was alert and oriented but complaining of lightheadedness and nausea.  She was able to move all extremities.  Per ED provider note, patient also complained of chest pain and was given a dose of sublingual nitroglycerin prior to arrival in the ED.  CT head was performed that did not show any acute abnormality.  Patient was admitted on the medicine service.  Overnight patient was noted to be hypertensive with SBP 142-224.  Gradually, patient's mental status worsened and around 120 RN noticed that patient was drooling with agonal respirations, not opening her eyes.  She was then noted to have seizure-like activity that lasted for about 30 minutes.  Patient was given IV Ativan after which seizure stopped.  She was also given  Keppra and EEG was ordered.  Neurology was consulted this morning for further management.  ROS: All other systems reviewed and negative except as noted in the HPI.    Past Medical History:  Diagnosis Date   Anemia    Anxiety    Atrial flutter ([place]) 02/22/2015   C. difficile colitis 10/10/2011   Chronic diarrhea    Chronic pain    Depression    ESRD on hemodialysis [facility])    Dialysis [name]   GERD (gastroesophageal reflux disease)    HIV (human immunodeficiency virus infection) ([place])    Hypertension    Insomnia    Intracranial hemorrhage (HCC)    Muscle weakness (generalized)    Pulmonary HTN (HCC)    Tachycardia    TIA (transient ischemic attack)    [facility] do not have this dx   Traumatic hematoma of right upper arm 02/22/2015   Family History  Problem Relation Age of Onset   Anesthesia problems Neg Hx    Hypotension Neg Hx    Malignant hyperthermia Neg Hx    Pseudochol deficiency Neg Hx    Colon cancer Neg Hx        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 Her smoking use included cigarettes. She has never used smokeless tobacco. She reports that she does not drink alcohol and does not use drugs.   Medications Prior to Admission  Medication Sig Dispense Refill  Last Dose   Amino Acids-Protein Hydrolys (PRO-STAT 64 PO) Take by mouth. Give 73ml by mouth in the morning for wound healing   01/23/2021   apixaban (ELIQUIS) 2.5 MG TABS tablet Take 1 tablet (2.5 mg total) by mouth 2 (two) times daily. 60 tablet 0 01/23/2021 at 0800   baclofen (LIORESAL) 10 MG tablet Take 10 mg by mouth daily as needed for muscle spasms.   Past Month   clopidogrel (PLAVIX) 75 MG tablet Take 1 tablet (75 mg total) by mouth daily with breakfast. 30 tablet  Past Month   collagenase (SANTYL) ointment Apply 1 application topically daily. Apply to sacrum topically every day shift for wound  healing      cyanocobalamin (,VITAMIN B-12,) 1000 MCG/ML injection Inject 1,000 mcg into the muscle every 30 (thirty) days. On the 28th of every month   Past Month   dolutegravir (TIVICAY) 50 MG tablet Take 50 mg by mouth daily at 4 PM.   Past Week   Emollient (MINERIN) LOTN Apply 1 application topically every 12 (twelve) hours as needed (dry skin). Apply to feet and ankles      fentaNYL (DURAGESIC) 25 MCG/HR Place 1 patch onto the skin every 3 (three) days. 3 patch 0 Past Week   lamivudine (EPIVIR) 100 MG tablet Take 50 mg by mouth daily. (0800)   01/23/2021   Lidocaine HCl 4 % CREA Apply 1 application topically every 12 (twelve) hours as needed (pain).      lidocaine-prilocaine (EMLA) cream Apply 1 application topically See admin instructions. (0900) Apply to fistula site one hour before dialysis on Tuesday, Thursday, and Saturday.   Past Month   megestrol (MEGACE) 400 MG/10ML suspension Take 400 mg by mouth daily.   01/23/2021   melatonin 3 MG TABS tablet Take 3 mg by mouth at bedtime. (2000)   Past Week   methadone (DOLOPHINE) 5 MG tablet Take 0.5 tablets (2.5 mg total) by mouth daily at 8 pm. (2000) 3 tablet 0 Past Week   metoprolol tartrate (LOPRESSOR) 25 MG tablet Take 1 tablet (25 mg total) by mouth 2 (two) times daily. 60 tablet 0 01/23/2021 at 0800   mirtazapine (REMERON) 15 MG tablet Take 15 mg by mouth at bedtime.   Past Month   nitroGLYCERIN (NITROSTAT) 0.4 MG SL tablet Place 0.4 mg under the tongue every 5 (five) minutes x 3 doses as needed for chest pain.    unk at unk   ondansetron (ZOFRAN ODT) 8 MG disintegrating tablet Take 1 tablet (8 mg total) by mouth every 8 (eight) hours as needed for nausea or vomiting. 30 tablet 3 Past Month   Oxycodone HCl 10 MG TABS Take 1 tablet (10 mg total) by mouth every 12 (twelve) hours as needed (severe pain). 4 tablet 0 Past Week   pantoprazole (PROTONIX) 40 MG tablet Take 1 tablet (40 mg total) by mouth daily before breakfast. 30 tablet 3  01/23/2021   promethazine (PHENERGAN) 12.5 MG tablet Take 12.5 mg by mouth See admin instructions. Take 1 tablet (12.5 mg) by mouth in the morning every Tuesday, Thursday and Saturday for nausea/vomiting and every 6 hours as needed for n/v   01/23/2021   senna (SENOKOT) 8.6 MG tablet Take 2 tablets by mouth every evening. (2100)   Past Week   vancomycin (VANCOCIN) 500-5 MG/100ML-% IVPB Inject 100 mLs (500 mg total) into the vein Every Tuesday,Thursday,and Saturday with dialysis. 100 mL 0 01/23/2021   venlafaxine XR (EFFEXOR-XR) 150 MG 24 hr capsule Take  150 mg by mouth daily with breakfast. (0900)   01/23/2021   vitamin C (ASCORBIC ACID) 500 MG tablet Take 500 mg by mouth 2 (two) times daily.   01/23/2021   zidovudine (RETROVIR) 100 MG capsule Take 100 mg by mouth 3 (three) times daily. (0900, 1300, & 2100)   01/23/2021   Zinc Sulfate (ZINC-220 PO) Take 1 tablet by mouth daily.   01/23/2021   Multiple Vitamin (MULTIVITAMIN WITH MINERALS) TABS tablet Take 1 tablet by mouth at bedtime. (2000)         Exam: Current vital signs: BP (!) 169/96 (BP Location: Right Leg)   Pulse 79   Temp (!) 100.4 F (38 C) (Oral)   Resp (!) 28   Wt 43.4 kg   LMP  (LMP Unknown)   SpO2 100%   BMI 17.22 kg/m  Vital signs in last 24 hours: Temp:  [97.7 F (36.5 C)-102.4 F (39.1 C)] 100.4 F (38 C) (11/25 0935) Pulse Rate:  [62-104] 79 (11/25 0935) Resp:  [9-28] 28 (11/25 0935) BP: (142-224)/(86-133) 169/96 (11/25 0935) SpO2:  [95 %-100 %] 100 % (11/25 0935) Weight:  [43.4 kg] 43.4 kg (11/24 1830)   Physical Exam  Constitutional: Appears well-developed and well-nourished.  Psych: unable to assess due to altered mental status Eyes: No scleral injection Respiratory: Effort normal, non-labored breathing Neuro: Moans to noxious stimuli, does not follow commands, PERRLA, no forced gaze deviation, no apparent facial asymmetry, withdraws to noxious stimuli with antigravity strength in left upper extremity,  1/5 right upper extremity, barely withdraws to noxious stimuli in bilateral lower extremities  I have reviewed labs in epic and the results pertinent to this consultation are: CBC:  Recent Labs  Lab 01/23/21 1016 01/24/21 0134  WBC 3.2* 4.9  NEUTROABS 2.4  --   HGB 10.1* 11.5*  HCT 34.4* 40.1  MCV 104.2* 107.8*  PLT 147* 131*    Basic Metabolic Panel:  Lab Results  Component Value Date   NA 137 01/24/2021   K 4.4 01/24/2021   CO2 17 (L) 01/24/2021   GLUCOSE 132 (H) 01/24/2021   BUN 35 (H) 01/24/2021   CREATININE 8.31 (H) 01/24/2021   CALCIUM 8.7 (L) 01/24/2021   GFRNONAA 5 (L) 01/24/2021   GFRAA 9 (L) 10/22/2019   Lipid Panel:  Lab Results  Component Value Date   LDLCALC 66 12/22/2019   HgbA1c: No results found for: HGBA1C Urine Drug Screen: No results found for: LABOPIA, COCAINSCRNUR, LABBENZ, AMPHETMU, THCU, LABBARB  Alcohol Level No results found for: ETH   I have reviewed the images obtained: CT head without contrast 01/24/2021: No acute intracranial process  ASSESSMENT/PLAN: 70 year old female with multiple medical comorbidities as noted above who presented with transient alteration of awareness during dialysis.  After arrival she was initially alert and oriented however gradually her mental status worsened and she was also noted to have seizure-like activity lasting about 30 minutes overnight.  Acute encephalopathy New onset seizures Fever Hyperammonemia Thrombocytopenia Hypoalbuminemia Hypothyroidism Right upper extremity paresis -New onset mental status, fever, seizures is concerning for meningitis.  Elevated blood pressure along with altered mental status and seizure is also concerning for PRES. patient is on apixaban for atrial flutter which was not administered yesterday evening and has right upper extremity weakness this morning.  Therefore also concern for acute stroke.  However, right upper extremity weakness could also be due to infected right AV  fistula -Of note, patient has not returned back to baseline since seizure.  Therefore also concern  for nonconvulsive status epilepticus  Recommendations: -Patient needs MRI brain stat to look for acute ischemic stroke versus PRES -Depending on MRI findings, we will need to adjust better blood pressure parameters (permissive hypertension for acute stroke versus SBP less than 140 for PRES) -Due to suspicion for meningitis, patient also needs lumbar puncture. Per MAR, patient was not given Eliquis last night.  Therefore will order PT/INR and request radiology for lumbar puncture -EEG ordered and pending.  However any pain does not have EEG techs onsite therefore this could be delayed.  I have called the EEG tech and notified of urgency. -Continue Keppra 500 mg twice daily (renally adjusted) -Plan discussed in detail with Dr. Berlinda Last - MRI brain wo contrast showed cortical and subcortical edema in the occipital parietal lobes bilaterally, right for slightly greater than left. This is not present on the prior MRI of 11/18/2020. Findings may be due to PRES.    -Ordered IV Cardene drip with goal SBP less than 140.  Discussed with Dr. Manuella Ghazi to move patient to ICU -LP pending, ordered CSF tests to look for infection.  Patient is already on vancomycin and presentation could most likely be explained due to PRES. Therefore not starting broad-spectrum antibiotics and antivirals until after LP. -If CSF shows pleocytosis, would recommend broadening antibiotic coverage and adding acyclovir.  Due to patient's immunocompromise status, may also need antifungal coverage -EEG still pending.  If EEG shows any evidence of seizures, would recommend transfer to [facility] for long-term EEG.    ADEENDUM - Routine eeg showed generalized periodic discharges with triphasic morphology which is on the ictal-interictal continuum and low potential for seizure recurrence. Patient is still not back to baseline.  Therefore would recommend transfer to [facility] after LP for LTM eeg.  - If patient has any further breakthrough seizures, would recommend loading with IV Vimpat 400 mg   Thank you for allowing [place] to participate in the care of this patient. If you have any further questions, please contact  me or neurohospitalist.    CRITICAL CARE Performed by: Lora Havens   Total critical care time: 50 minutes  Critical care time was exclusive of separately billable procedures and treating other patients.  Critical care was necessary to treat or prevent imminent or life-threatening deterioration.  Critical care was time spent personally by me on the following activities: development of treatment plan with patient and/or surrogate as well as nursing, discussions with consultants, evaluation of patient's response to treatment, examination of patient, obtaining history from patient or surrogate, ordering and performing treatments and interventions, ordering and review of laboratory studies, ordering and review of radiographic studies, pulse oximetry and re-evaluation of patient's condition.   [facility] neurohospitalist

## 2021-01-24 NOTE — Progress Notes (Signed)
PROGRESS NOTE    [name]  NWG:[id number] DOB: 1950-09-02 DOA: 01/23/2021 PCP: Hilbert Corrigan, MD   Brief Narrative:   [name] is a 70 y.o. female with medical history significant for ESRD on HD TTS, HIV, atrial flutter on Eliquis, anemia of chronic disease, chronic pain, and recent MRSA infection involving right AV fistula status post washout and currently on IV vancomycin with wound VAC.  She was at hemodialysis earlier in the day when she had a syncopal episode.  Unfortunately she has continued to have persistent altered mentation as well as elevated blood pressure readings and seizure activity overnight.  She had brain MRI on 11/25 demonstrating no acute CVA and findings concerning for PRES. She has been seen by teleneurology with recommendation to transfer to [facility] for neurology evaluation preparation as well as initiation of Cardene drip for blood pressure control.  She was also noted to have a fever today for which she has been recommended to have a lumbar puncture.  Assessment & Plan:   Principal Problem:   Acute encephalopathy   Acute encephalopathy with PRES -CT head with no acute findings -Brain MRI 11/25 with no acute CVA; suspicion of PRES; transferred to ICU on Cardene drip for blood pressure control -TSH elevated, T4 ordered -EEG with generalized slowing, but no ongoing seizure activity -Recent 2D echocardiogram 9/22 with LVEF 60 to 65% -Carotid ultrasound-no significant hemodynamic stenosis -Neurology recommends transfer to [facility] for ongoing neurology work-up and evaluation and workup -LP ordered and pending   Recent right AV fistula MRSA infection -Continue on IV vancomycin with dialysis for total 6 weeks with last day 02/12/2021 -Continue wound VAC and ongoing wound care   ESRD on hemodialysis TTS -Appreciate nephrology consultation for further hemodialysis while inpatient; plan for dialysis 11/26 -Currently dialyzing via left  IJ TDC   Paroxysmal atrial flutter -Continue metoprolol and Eliquis once able to take p.o. -Monitor on telemetry -EKG currently showing sinus rhythm   Hypertension -Continue Cardene drip as noted above   HIV -Recent CD4 252 on 11/10 -Continue antiretroviral therapy   Anemia of chronic disease -Continue to monitor hemoglobin levels which are currently stable   History of chronic pain -Currently on fentanyl patch which will be discontinued and methadone will be held until mentation improves   Severe protein calorie malnutrition -Encourage oral intake/protein shakes   DVT prophylaxis:Eliquis Code Status: DNR Family Communication: Discussed with daughter [name] on phone as well as Althea Charon at bedside Disposition Plan: Transfer to [facility] ICU Status is: Inpatient  Remains inpatient appropriate because: Altered mentation and need for IV medications for blood pressure control.   Consultants:  Neurology-Dr. Hortense Ramal Nephrology-Dr. Royce Macadamia  Procedures:  EEG 11/25  Antimicrobials:  Anti-infectives (From admission, onward)    Start     Dose/Rate Route Frequency Ordered Stop   01/25/21 1200  vancomycin (VANCOCIN) IVPB 500 mg/100 ml premix        500 mg 100 mL/hr over 60 Minutes Intravenous Every T-Th-Sa (Hemodialysis) 01/23/21 1518     01/24/21 1600  lamiVUDine (EPIVIR) 10 MG/ML solution 50 mg        50 mg Oral Daily 01/24/21 1424     01/23/21 1600  dolutegravir (TIVICAY) tablet 50 mg        50 mg Oral Daily-1600 01/23/21 1518     01/23/21 1600  zidovudine (RETROVIR) capsule 100 mg       Note to Pharmacy: (0900, 1300, & 2100)     100 mg  Oral 3 times daily 01/23/21 1518     01/23/21 1530  lamivudine (EPIVIR) tablet 50 mg  Status:  Discontinued       Note to Pharmacy: (0800)     50 mg Oral Daily 01/23/21 1518 01/24/21 1424       Subjective: Patient seen and evaluated today and continues to remain unresponsive.  She continues to have elevated blood pressure readings.  Seizure  activity was noted elevated before she was given Ativan and started on Keppra.  Objective: Vitals:   01/24/21 0935 01/24/21 1031 01/24/21 1131 01/24/21 1230  BP: (!) 169/96 (!) 187/118 (!) 176/109 (!) 155/96  Pulse: 79 79 72 69  Resp: (!) 28 (!) 24 (!) 24 (!) 22  Temp: (!) 100.4 F (38 C)  98.9 F (37.2 C) 98.5 F (36.9 C)  TempSrc: Oral  Oral Oral  SpO2: 100% 100% 100% 100%  Weight:        Intake/Output Summary (Last 24 hours) at 01/24/2021 1510 Last data filed at 01/24/2021 0205 Gross per 24 hour  Intake 100 ml  Output --  Net 100 ml   Filed Weights   01/23/21 1830  Weight: 43.4 kg    Examination:  General exam: Unresponsive and only awakens to sternal rub Respiratory system: Clear to auscultation. Respiratory effort normal. Cardiovascular system: S1 & S2 heard, RRR.  Left IJ catheter clean dry and intact Gastrointestinal system: Abdomen is soft Central nervous system: Unresponsive Extremities: Right upper extremity wounds as noted   Skin: No significant lesions noted Psychiatry: Cannot be assessed    Data Reviewed: I have personally reviewed following labs and imaging studies  CBC: Recent Labs  Lab 01/23/21 1016 01/24/21 0134 01/24/21 1331  WBC 3.2* 4.9 4.7  NEUTROABS 2.4  --  3.8  HGB 10.1* 11.5* 10.2*  HCT 34.4* 40.1 34.0*  MCV 104.2* 107.8* 104.3*  PLT 147* 131* 323*   Basic Metabolic Panel: Recent Labs  Lab 01/23/21 1016 01/24/21 0134  NA 137 137  K 4.3 4.4  CL 99 98  CO2 24 17*  GLUCOSE 79 132*  BUN 29* 35*  CREATININE 6.80* 8.31*  CALCIUM 8.5* 8.7*  MG  --  2.3   GFR: Estimated Creatinine Clearance: 4.5 mL/min (A) (by C-G formula based on SCr of 8.31 mg/dL (H)). Liver Function Tests: Recent Labs  Lab 01/23/21 1016 01/24/21 0134  AST 17 27  ALT 10 11  ALKPHOS 87 95  BILITOT 0.9 0.9  PROT 7.5 8.4*  ALBUMIN 3.0* 3.3*   No results for input(s): LIPASE, AMYLASE in the last 168 hours. Recent Labs  Lab 01/23/21 1501  01/24/21 0134  AMMONIA 25 65*   Coagulation Profile: Recent Labs  Lab 01/24/21 1054  INR 1.2   Cardiac Enzymes: No results for input(s): CKTOTAL, CKMB, CKMBINDEX, TROPONINI in the last 168 hours. BNP (last 3 results) No results for input(s): PROBNP in the last 8760 hours. HbA1C: No results for input(s): HGBA1C in the last 72 hours. CBG: Recent Labs  Lab 01/23/21 1019 01/24/21 0124 01/24/21 1131  GLUCAP 73 127* 126*   Lipid Profile: No results for input(s): CHOL, HDL, LDLCALC, TRIG, CHOLHDL, LDLDIRECT in the last 72 hours. Thyroid Function Tests: Recent Labs    01/23/21 1501  TSH 4.767*   Anemia Panel: No results for input(s): VITAMINB12, FOLATE, FERRITIN, TIBC, IRON, RETICCTPCT in the last 72 hours. Sepsis Labs: No results for input(s): PROCALCITON, LATICACIDVEN in the last 168 hours.  Recent Results (from the past 240 hour(s))  Resp Panel by RT-PCR (Flu A&B, Covid) Nasopharyngeal Swab     Status: None   Collection Time: 01/23/21  3:04 PM   Specimen: Nasopharyngeal Swab; Nasopharyngeal(NP) swabs in vial transport medium  Result Value Ref Range Status   SARS Coronavirus 2 by RT PCR NEGATIVE NEGATIVE Final    Comment: (NOTE) SARS-CoV-2 target nucleic acids are NOT DETECTED.  The SARS-CoV-2 RNA is generally detectable in upper respiratory specimens during the acute phase of infection. The lowest concentration of SARS-CoV-2 viral copies this assay can detect is 138 copies/mL. A negative result does not preclude SARS-Cov-2 infection and should not be used as the sole basis for treatment or other patient management decisions. A negative result may occur with  improper specimen collection/handling, submission of specimen other than nasopharyngeal swab, presence of viral mutation(s) within the areas targeted by this assay, and inadequate number of viral copies(<138 copies/mL). A negative result must be combined with clinical observations, patient history, and  epidemiological information. The expected result is Negative.  Fact Sheet for Patients:  EntrepreneurPulse.com.au  Fact Sheet for Healthcare Providers:  IncredibleEmployment.be  This test is no t yet approved or cleared by the [place] FDA and  has been authorized for detection and/or diagnosis of SARS-CoV-2 by FDA under an Emergency Use Authorization (EUA). This EUA will remain  in effect (meaning this test can be used) for the duration of the COVID-19 declaration under Section 564(b)(1) of the Act, 21 U.S.C.section 360bbb-3(b)(1), unless the authorization is terminated  or revoked sooner.       Influenza A by PCR NEGATIVE NEGATIVE Final   Influenza B by PCR NEGATIVE NEGATIVE Final    Comment: (NOTE) The Xpert Xpress SARS-CoV-2/FLU/RSV plus assay is intended as an aid in the diagnosis of influenza from Nasopharyngeal swab specimens and should not be used as a sole basis for treatment. Nasal washings and aspirates are unacceptable for Xpert Xpress SARS-CoV-2/FLU/RSV testing.  Fact Sheet for Patients: EntrepreneurPulse.com.au  Fact Sheet for Healthcare Providers: IncredibleEmployment.be  This test is not yet approved or cleared by the [place] FDA and has been authorized for detection and/or diagnosis of SARS-CoV-2 by FDA under an Emergency Use Authorization (EUA). This EUA will remain in effect (meaning this test can be used) for the duration of the COVID-19 declaration under Section 564(b)(1) of the Act, 21 U.S.C. section 360bbb-3(b)(1), unless the authorization is terminated or revoked.  Performed at [facility], [address] [place], [place] [address]   Culture, blood (routine x 2)     Status: None (Preliminary result)   Collection Time: 01/24/21  1:31 PM   Specimen: BLOOD RIGHT HAND  Result Value Ref Range Status   Specimen Description BLOOD RIGHT HAND  Final   Special Requests    Final    Immunocompromised BOTTLES DRAWN AEROBIC AND ANAEROBIC Blood Culture results may not be optimal due to an inadequate volume of blood received in culture bottles Performed at [facility], [address] [place], [place] [address]    Culture PENDING  Incomplete   Report Status PENDING  Incomplete  Culture, blood (routine x 2)     Status: None (Preliminary result)   Collection Time: 01/24/21  1:31 PM   Specimen: BLOOD LEFT HAND  Result Value Ref Range Status   Specimen Description BLOOD LEFT HAND  Final   Special Requests   Final    Immunocompromised BOTTLES DRAWN AEROBIC AND ANAEROBIC Blood Culture adequate volume Performed at [facility], [address], [place], [place] [address]  Culture PENDING  Incomplete   Report Status PENDING  Incomplete         Radiology Studies: CT HEAD WO CONTRAST (5MM)  Result Date: 01/24/2021 CLINICAL DATA:  Possible seizure prior to arrival, near syncope during dialysis. EXAM: CT HEAD WITHOUT CONTRAST TECHNIQUE: Contiguous axial images were obtained from the base of the skull through the vertex without intravenous contrast. COMPARISON:  01/23/2021. FINDINGS: Brain: No evidence of acute infarction, hemorrhage, cerebral edema, mass, mass effect, or midline shift. No hydrocephalus or extra-axial fluid collection. Periventricular white matter changes, likely the sequela of chronic small vessel ischemic disease. Vascular: No hyperdense vessel. Atherosclerotic calcifications in the intracranial carotid and vertebral arteries. Skull: Renal osteodystrophy.  Negative for fracture or focal lesion. Sinuses/Orbits: No acute finding. Other: The mastoid air cells are well aerated. IMPRESSION: No acute intracranial process. Electronically Signed   By: Merilyn Baba M.D.   On: 01/24/2021 02:13   CT Head Wo Contrast  Result Date: 01/23/2021 CLINICAL DATA:  Mental status changes of unknown cause in a 70 year old female. EXAM: CT HEAD WITHOUT CONTRAST TECHNIQUE:  Contiguous axial images were obtained from the base of the skull through the vertex without intravenous contrast. COMPARISON:  April 07, 2020. FINDINGS: Brain: No evidence of acute infarction, hemorrhage, hydrocephalus, extra-axial collection or mass lesion/mass effect. Signs of atrophy and chronic microvascular ischemic change as before. Vascular: No hyperdense vessel or unexpected calcification. Skull: Normal. Negative for fracture or focal lesion. Signs of renal osteodystrophy. Sinuses/Orbits: Visualized paranasal sinuses and orbits without acute process. Other: None IMPRESSION: No acute intracranial pathology. Signs of atrophy and chronic microvascular ischemic change as before. Signs of renal osteodystrophy. Electronically Signed   By: Zetta Bills M.D.   On: 01/23/2021 10:45   MR BRAIN WO CONTRAST  Result Date: 01/24/2021 CLINICAL DATA:  Mental status change. Dialysis patient. Possible seizure. EXAM: MRI HEAD WITHOUT CONTRAST TECHNIQUE: Multiplanar, multiecho pulse sequences of the brain and surrounding structures were obtained without intravenous contrast. COMPARISON:  CT head 01/24/2021, MRI 11/18/2020 FINDINGS: Brain: Motion degraded study. Susceptibility artifact right occipital region. No metal is seen in this area on CT. Cortical and subcortical FLAIR hyperintensity is present in the occipital parietal lobes bilaterally, not present on the recent MRI. Moderate to extensive chronic white matter changes throughout the periventricular deep white matter bilaterally similar to the prior MRI. Brainstem intact. 12 mm hyperintensity right cerebellum not seen on the prior MRI. Negative for acute infarct.  Negative for hemorrhage or mass. Vascular: Normal arterial flow voids. Skull and upper cervical spine: Limited evaluation due to artifact. Sinuses/Orbits: Negative Other: None IMPRESSION: Negative for acute infarct Cortical and subcortical edema in the occipital parietal lobes bilaterally, right for  slightly greater than left. This is not present on the prior MRI of 11/18/2020. Findings may be due to PRES. Correlate with hypertension history. Electronically Signed   By: Franchot Gallo M.D.   On: 01/24/2021 12:22   US Carotid Bilateral  Result Date: 01/24/2021 CLINICAL DATA:  Syncope, hypertension, altered mental status EXAM: BILATERAL CAROTID DUPLEX ULTRASOUND TECHNIQUE: Pearline Cables scale imaging, color Doppler and duplex ultrasound were performed of bilateral carotid and vertebral arteries in the neck. COMPARISON:  None. FINDINGS: Criteria: Quantification of carotid stenosis is based on velocity parameters that correlate the residual internal carotid diameter with NASCET-based stenosis levels, using the diameter of the distal internal carotid lumen as the denominator for stenosis measurement. The following velocity measurements were obtained: RIGHT ICA: 56/17 cm/sec CCA: 761/60 cm/sec SYSTOLIC ICA/CCA RATIO:  0.4  ECA: 40 cm/sec LEFT ICA: 90/19 cm/sec CCA: 295/28 cm/sec SYSTOLIC ICA/CCA RATIO:  0.8 ECA: 51 cm/sec RIGHT CAROTID ARTERY: Tortuosity and mild to moderate atherosclerotic change. Despite this, no significant right ICA stenosis, velocity elevation, turbulent flow. Degree of narrowing less than 50% by ultrasound criteria. RIGHT VERTEBRAL ARTERY:  Normal antegrade flow LEFT CAROTID ARTERY: Similar atherosclerotic change. Negative for stenosis, velocity elevation, turbulent flow. Degree of narrowing also less than 50% by ultrasound criteria. LEFT VERTEBRAL ARTERY:  Normal antegrade flow IMPRESSION: Bilateral carotid atherosclerosis. Negative for stenosis. Degree of narrowing less than 50% bilaterally by ultrasound criteria. Patent antegrade vertebral flow bilaterally Electronically Signed   By: Jerilynn Mages.  Shick M.D.   On: 01/24/2021 11:58   DG Chest Portable 1 View  Result Date: 01/23/2021 CLINICAL DATA:  Syncope. EXAM: PORTABLE CHEST 1 VIEW COMPARISON:  Chest x-ray dated December 24, 2020. FINDINGS: Unchanged  tunneled left internal jugular dialysis catheter. Stable cardiomegaly. Unchanged right brachiocephalic vein stent. Unchanged central pulmonary artery enlargement. No focal consolidation, pleural effusion, or pneumothorax. No acute osseous abnormality. IMPRESSION: 1. No active disease. Electronically Signed   By: Titus Dubin M.D.   On: 01/23/2021 10:49   EEG adult  Result Date: 01/24/2021 Lora Havens, MD     01/24/2021  2:51 PM Patient Name: [name] MRN: [id number] Epilepsy Attending: Lora Havens Referring Physician/Provider: Dr Heath Lark Date: 01/24/2021 Duration: 23.35 mins Patient history: 70 year old female who presented with altered mental status, fever and seizure.  EEG to evaluate for seizure. Level of alertness:  lethargic AEDs during EEG study: LEV Technical aspects: This EEG study was done with scalp electrodes positioned according to the 10-20 International system of electrode placement. Electrical activity was acquired at a sampling rate of 500Hz  and reviewed with a high frequency filter of 70Hz  and a low frequency filter of 1Hz . EEG data were recorded continuously and digitally stored. Description: No posterior dominant rhythm was seen.  EEG showed continuous generalized polymorphic sharply contoured 3 to 6 Hz theta-delta slowing. Generalized periodic discharges with triphasic morphology were noted with fluctuation frequency between 0.5- 1.5 Hz were also noted. Hyperventilation and photic stimulation were not performed.   ABNORMALITY - Periodic discharges with triphasic morphology, generalized ( GPDs) - Continuous slow, generalized IMPRESSION: This study showed generalized periodic discharges with triphasic morphology which is on the ictal-interictal continuum and low potential for seizure recurrence. This eeg pattern can also be seen due to toxic-metabolic causes like uremia, hyperammonemia. Additionally eeg is suggestive of moderate diffuse encephalopathy, nonspecific  etiology. No seizures or epileptiform discharges were seen throughout the recording. Priyanka Barbra Sarks        Scheduled Meds:  [START ON 01/25/2021] calcitRIOL  1.25 mcg Oral Q T,Th,Sa-HD   Chlorhexidine Gluconate Cloth  6 each Topical Daily   [START ON 01/25/2021] Chlorhexidine Gluconate Cloth  6 each Topical Q0600   [START ON 01/25/2021] cinacalcet  90 mg Oral Q T,Th,Sa-HD   clopidogrel  75 mg Oral Q breakfast   collagenase  1 application Topical Daily   [START ON 01/27/2021] cyanocobalamin  1,000 mcg Intramuscular Q30 days   dolutegravir  50 mg Oral q1600   lamiVUDine  50 mg Oral Daily   [START ON 01/25/2021] lidocaine-prilocaine  1 application Topical Q T,Th,Sa-HD   metoprolol tartrate  50 mg Oral BID   pantoprazole  40 mg Oral QAC breakfast   senna  2 tablet Oral QPM   sodium chloride flush  3 mL Intravenous Q12H   [START ON 01/25/2021]  vancomycin  500 mg Intravenous Q T,Th,Sa-HD   zidovudine  100 mg Oral TID   Continuous Infusions:  sodium chloride     levETIRAcetam 500 mg (01/24/21 0940)   niCARDipine 12.5 mg/hr (01/24/21 1507)     LOS: 1 day    Critical care time: 45 minutes    Ocie Stanzione D Manuella Ghazi, DO [facility]  If 7PM-7AM, please contact night-coverage [email or web address] 01/24/2021, 3:10 PM

## 2021-01-24 NOTE — Progress Notes (Signed)
Rapid response called for pt. Upon arrival pt is on NRB mask satting 100%. Patient is not very responsive and is not verbal. MD at bedside. Plan for meds and taking patient to CT scan. No further respiratory intervention needed at this time.

## 2021-01-25 ENCOUNTER — Inpatient Hospital Stay (HOSPITAL_COMMUNITY): Payer: Medicare Other

## 2021-01-25 DIAGNOSIS — G9340 Encephalopathy, unspecified: Secondary | ICD-10-CM | POA: Diagnosis not present

## 2021-01-25 DIAGNOSIS — E875 Hyperkalemia: Secondary | ICD-10-CM | POA: Diagnosis not present

## 2021-01-25 DIAGNOSIS — I4891 Unspecified atrial fibrillation: Secondary | ICD-10-CM

## 2021-01-25 DIAGNOSIS — I6783 Posterior reversible encephalopathy syndrome: Principal | ICD-10-CM

## 2021-01-25 DIAGNOSIS — R410 Disorientation, unspecified: Secondary | ICD-10-CM | POA: Diagnosis not present

## 2021-01-25 DIAGNOSIS — Z7189 Other specified counseling: Secondary | ICD-10-CM

## 2021-01-25 DIAGNOSIS — R569 Unspecified convulsions: Secondary | ICD-10-CM

## 2021-01-25 DIAGNOSIS — Z992 Dependence on renal dialysis: Secondary | ICD-10-CM

## 2021-01-25 DIAGNOSIS — E0590 Thyrotoxicosis, unspecified without thyrotoxic crisis or storm: Secondary | ICD-10-CM | POA: Insufficient documentation

## 2021-01-25 DIAGNOSIS — B20 Human immunodeficiency virus [HIV] disease: Secondary | ICD-10-CM

## 2021-01-25 DIAGNOSIS — K219 Gastro-esophageal reflux disease without esophagitis: Secondary | ICD-10-CM

## 2021-01-25 DIAGNOSIS — I161 Hypertensive emergency: Secondary | ICD-10-CM

## 2021-01-25 DIAGNOSIS — N186 End stage renal disease: Secondary | ICD-10-CM

## 2021-01-25 DIAGNOSIS — D539 Nutritional anemia, unspecified: Secondary | ICD-10-CM

## 2021-01-25 DIAGNOSIS — A4902 Methicillin resistant Staphylococcus aureus infection, unspecified site: Secondary | ICD-10-CM | POA: Diagnosis present

## 2021-01-25 LAB — CSF CELL COUNT WITH DIFFERENTIAL
Eosinophils, CSF: 0 % (ref 0–1)
Eosinophils, CSF: 0 % (ref 0–1)
Lymphs, CSF: 11 % — ABNORMAL LOW (ref 40–80)
Lymphs, CSF: 24 % — ABNORMAL LOW (ref 40–80)
Monocyte-Macrophage-Spinal Fluid: 2 % — ABNORMAL LOW (ref 15–45)
Monocyte-Macrophage-Spinal Fluid: 2 % — ABNORMAL LOW (ref 15–45)
RBC Count, CSF: 258 /mm3 — ABNORMAL HIGH
RBC Count, CSF: 530 /mm3 — ABNORMAL HIGH
Segmented Neutrophils-CSF: 73 % — ABNORMAL HIGH (ref 0–6)
Segmented Neutrophils-CSF: 87 % — ABNORMAL HIGH (ref 0–6)
Tube #: 1
Tube #: 4
WBC, CSF: 4 /mm3 (ref 0–5)
WBC, CSF: 8 /mm3 — ABNORMAL HIGH (ref 0–5)

## 2021-01-25 LAB — CBC
HCT: 28.8 % — ABNORMAL LOW (ref 36.0–46.0)
Hemoglobin: 8.7 g/dL — ABNORMAL LOW (ref 12.0–15.0)
MCH: 30.9 pg (ref 26.0–34.0)
MCHC: 30.2 g/dL (ref 30.0–36.0)
MCV: 102.1 fL — ABNORMAL HIGH (ref 80.0–100.0)
Platelets: 127 10*3/uL — ABNORMAL LOW (ref 150–400)
RBC: 2.82 MIL/uL — ABNORMAL LOW (ref 3.87–5.11)
RDW: 18.5 % — ABNORMAL HIGH (ref 11.5–15.5)
WBC: 3.8 10*3/uL — ABNORMAL LOW (ref 4.0–10.5)
nRBC: 0 % (ref 0.0–0.2)

## 2021-01-25 LAB — T4, FREE: Free T4: 0.99 ng/dL (ref 0.61–1.12)

## 2021-01-25 LAB — RENAL FUNCTION PANEL
Albumin: 2.8 g/dL — ABNORMAL LOW (ref 3.5–5.0)
Anion gap: 13 (ref 5–15)
BUN: 49 mg/dL — ABNORMAL HIGH (ref 8–23)
CO2: 24 mmol/L (ref 22–32)
Calcium: 8.4 mg/dL — ABNORMAL LOW (ref 8.9–10.3)
Chloride: 103 mmol/L (ref 98–111)
Creatinine, Ser: 10.46 mg/dL — ABNORMAL HIGH (ref 0.44–1.00)
GFR, Estimated: 4 mL/min — ABNORMAL LOW (ref >60)
Glucose, Bld: 96 mg/dL (ref 70–99)
Phosphorus: 8.4 mg/dL — ABNORMAL HIGH (ref 2.5–4.6)
Potassium: 5.8 mmol/L — ABNORMAL HIGH (ref 3.5–5.1)
Sodium: 140 mmol/L (ref 135–145)

## 2021-01-25 LAB — CRYPTOCOCCAL ANTIGEN, CSF: Crypto Ag: NEGATIVE

## 2021-01-25 LAB — MRSA NEXT GEN BY PCR, NASAL: MRSA by PCR Next Gen: NOT DETECTED

## 2021-01-25 LAB — PROTEIN AND GLUCOSE, CSF
Glucose, CSF: 47 mg/dL (ref 40–70)
Total  Protein, CSF: 63 mg/dL — ABNORMAL HIGH (ref 15–45)

## 2021-01-25 LAB — HIV ANTIBODY (ROUTINE TESTING W REFLEX): HIV Screen 4th Generation wRfx: REACTIVE — AB

## 2021-01-25 LAB — VITAMIN B12: Vitamin B-12: 1327 pg/mL — ABNORMAL HIGH (ref 180–914)

## 2021-01-25 LAB — FOLATE: Folate: 56.4 ng/mL (ref >5.9)

## 2021-01-25 LAB — TSH: TSH: 1.647 u[IU]/mL (ref 0.350–4.500)

## 2021-01-25 LAB — MAGNESIUM: Magnesium: 2.4 mg/dL (ref 1.7–2.4)

## 2021-01-25 LAB — PROLACTIN: Prolactin: 76 ng/mL — ABNORMAL HIGH (ref 4.8–23.3)

## 2021-01-25 MED ORDER — SODIUM CHLORIDE 0.9 % IV SOLN: 100.0000 mL | INTRAVENOUS | Status: DC | PRN

## 2021-01-25 MED ORDER — CALCIUM GLUCONATE-NACL 1-0.675 GM/50ML-% IV SOLN
1.0000 g | Freq: Once | INTRAVENOUS | Status: AC
  Administered 2021-01-25: 1000 mg via INTRAVENOUS
  Filled 2021-01-25: qty 50

## 2021-01-25 MED ORDER — PENTAFLUOROPROP-TETRAFLUOROETH EX AERO: 1.0000 "application " | INHALATION_SPRAY | CUTANEOUS | Status: DC | PRN

## 2021-01-25 MED ORDER — DEXTROSE 50 % IV SOLN
1.0000 | Freq: Once | INTRAVENOUS | Status: AC
  Administered 2021-01-25: 50 mL via INTRAVENOUS
  Filled 2021-01-25: qty 50

## 2021-01-25 MED ORDER — MIDAZOLAM HCL 2 MG/2ML IJ SOLN
INTRAMUSCULAR | Status: AC
  Administered 2021-01-25: 2 mg
  Filled 2021-01-25: qty 2

## 2021-01-25 MED ORDER — CLEVIDIPINE BUTYRATE 0.5 MG/ML IV EMUL
INTRAVENOUS | Status: AC
  Filled 2021-01-25: qty 100

## 2021-01-25 MED ORDER — SODIUM CHLORIDE 0.9 % IV SOLN: 50.0000 mg | INTRAVENOUS | Status: DC

## 2021-01-25 MED ORDER — CHLORHEXIDINE GLUCONATE CLOTH 2 % EX PADS
6.0000 | MEDICATED_PAD | Freq: Every day | CUTANEOUS | Status: DC
  Administered 2021-01-26: 07:00:00 6 via TOPICAL

## 2021-01-25 MED ORDER — LIDOCAINE HCL (PF) 1 % IJ SOLN: 5.0000 mL | INTRAMUSCULAR | Status: DC | PRN

## 2021-01-25 MED ORDER — METHIMAZOLE 5 MG PO TABS
5.0000 mg | ORAL_TABLET | Freq: Three times a day (TID) | ORAL | Status: DC
  Filled 2021-01-25 (×3): qty 1

## 2021-01-25 MED ORDER — LIDOCAINE HCL (PF) 1 % IJ SOLN
INTRAMUSCULAR | Status: AC
  Filled 2021-01-25: qty 5

## 2021-01-25 MED ORDER — SODIUM CHLORIDE 0.9 % IV SOLN
100.0000 mg | Freq: Two times a day (BID) | INTRAVENOUS | Status: DC
  Administered 2021-01-25 (×2): 100 mg via INTRAVENOUS
  Filled 2021-01-25 (×5): qty 10

## 2021-01-25 MED ORDER — ALTEPLASE 2 MG IJ SOLR: 2.0000 mg | Freq: Once | INTRAMUSCULAR | Status: DC | PRN

## 2021-01-25 MED ORDER — THIAMINE HCL 100 MG/ML IJ SOLN
100.0000 mg | Freq: Every day | INTRAMUSCULAR | Status: DC
  Administered 2021-01-25 – 2021-02-02 (×9): 100 mg via INTRAVENOUS
  Filled 2021-01-25 (×9): qty 2

## 2021-01-25 MED ORDER — PANTOPRAZOLE SODIUM 40 MG IV SOLR
40.0000 mg | Freq: Every day | INTRAVENOUS | Status: DC
  Administered 2021-01-25 – 2021-02-01 (×8): 40 mg via INTRAVENOUS
  Filled 2021-01-25 (×8): qty 40

## 2021-01-25 MED ORDER — HEPARIN SODIUM (PORCINE) 1000 UNIT/ML DIALYSIS: 1000.0000 [IU] | INTRAMUSCULAR | Status: DC | PRN

## 2021-01-25 MED ORDER — LABETALOL HCL 5 MG/ML IV SOLN: 20.0000 mg | INTRAVENOUS | Status: DC | PRN

## 2021-01-25 MED ORDER — INSULIN ASPART 100 UNIT/ML IJ SOLN
10.0000 [IU] | Freq: Once | INTRAMUSCULAR | Status: AC
Start: 2021-01-25 — End: 2021-01-25
  Administered 2021-01-25: 10 [IU] via INTRAVENOUS

## 2021-01-25 MED ORDER — LIDOCAINE-PRILOCAINE 2.5-2.5 % EX CREA: 1.0000 "application " | TOPICAL_CREAM | CUTANEOUS | Status: DC | PRN

## 2021-01-25 MED ORDER — HEPARIN (PORCINE) 25000 UT/250ML-% IV SOLN
850.0000 [IU]/h | INTRAVENOUS | Status: DC
  Administered 2021-01-25: 900 [IU]/h via INTRAVENOUS
  Administered 2021-01-26 – 2021-01-29 (×3): 850 [IU]/h via INTRAVENOUS
  Filled 2021-01-25 (×5): qty 250

## 2021-01-25 MED ORDER — LEVETIRACETAM IN NACL 500 MG/100ML IV SOLN
500.0000 mg | INTRAVENOUS | Status: DC
  Administered 2021-01-26: 09:00:00 500 mg via INTRAVENOUS

## 2021-01-25 MED ORDER — SODIUM CHLORIDE 0.9 % IV SOLN
1.0000 mg | Freq: Once | INTRAVENOUS | Status: AC
  Administered 2021-01-25: 1 mg via INTRAVENOUS
  Filled 2021-01-25: qty 0.2

## 2021-01-25 MED ORDER — CLEVIDIPINE BUTYRATE 0.5 MG/ML IV EMUL
0.0000 mg/h | INTRAVENOUS | Status: DC
  Administered 2021-01-25: 1 mg/h via INTRAVENOUS
  Administered 2021-01-25: 14 mg/h via INTRAVENOUS
  Administered 2021-01-26: 17:00:00 20 mg/h via INTRAVENOUS
  Administered 2021-01-26: 21:00:00 10 mg/h via INTRAVENOUS
  Administered 2021-01-26: 01:00:00 12 mg/h via INTRAVENOUS
  Administered 2021-01-26: 05:00:00 15 mg/h via INTRAVENOUS
  Administered 2021-01-26: 09:00:00 12 mg/h via INTRAVENOUS
  Administered 2021-01-26: 07:00:00 16 mg/h via INTRAVENOUS
  Administered 2021-01-26: 03:00:00 13 mg/h via INTRAVENOUS
  Administered 2021-01-26: 15:00:00 21 mg/h via INTRAVENOUS
  Administered 2021-01-26: 20:00:00 16 mg/h via INTRAVENOUS
  Administered 2021-01-26: 10:00:00 10 mg/h via INTRAVENOUS
  Administered 2021-01-27: 03:00:00 8 mg/h via INTRAVENOUS
  Administered 2021-01-27: 18:00:00 12 mg/h via INTRAVENOUS
  Administered 2021-01-27: 6 mg/h via INTRAVENOUS
  Administered 2021-01-27: 06:00:00 8 mg/h via INTRAVENOUS
  Administered 2021-01-27 (×2): 10 mg/h via INTRAVENOUS
  Administered 2021-01-27: 23:00:00 14 mg/h via INTRAVENOUS
  Administered 2021-01-28: 10 mg/h via INTRAVENOUS
  Administered 2021-01-28: 12 mg/h via INTRAVENOUS
  Administered 2021-01-28: 10 mg/h via INTRAVENOUS
  Administered 2021-01-28: 12 mg/h via INTRAVENOUS
  Administered 2021-01-28 (×3): 10 mg/h via INTRAVENOUS
  Administered 2021-01-28 (×2): 14 mg/h via INTRAVENOUS
  Filled 2021-01-25: qty 50
  Filled 2021-01-25: qty 100
  Filled 2021-01-25 (×7): qty 50
  Filled 2021-01-25 (×2): qty 100
  Filled 2021-01-25 (×6): qty 50
  Filled 2021-01-25: qty 100
  Filled 2021-01-25 (×3): qty 50

## 2021-01-25 MED ORDER — FENTANYL CITRATE PF 50 MCG/ML IJ SOSY
PREFILLED_SYRINGE | INTRAMUSCULAR | Status: AC
  Administered 2021-01-25: 50 ug
  Filled 2021-01-25: qty 2

## 2021-01-25 MED ORDER — HEPARIN SODIUM (PORCINE) 1000 UNIT/ML DIALYSIS
1000.0000 [IU] | INTRAMUSCULAR | Status: DC | PRN
  Filled 2021-01-25: qty 1

## 2021-01-25 NOTE — Progress Notes (Signed)
Neurology Brief Note  Please see full progress note from Dr. Lorrin Goodell this AM.  I was contacted by Dr. 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 I personally reviewed the raw data and agree.   Updated recommendations: - Add 500mg  dose of keppra post-dialysis T/T/S - Start vimpat 100mg  bid (renal dosing, give PM dose after dialysis on dialysis days). If EEG does not improve vimpat could be titrated to maximum of 150mg  q 12 hrs (given ESRD on HD). - AED orders placed - Cont LTM - Dr. Tacy Learn is planning to perform her LP at 1400 today. He will ask ID if they recommend any further CSF studies in addition to the ones neurology ordered overnight  Will continue to follow.  Su Monks, MD [facility] [phone]  If 7pm- 7am, please page neurology on call as listed in [place].

## 2021-01-25 NOTE — Evaluation (Signed)
Clinical/Bedside Swallow Evaluation Patient Details  Name: [name] MRN: [id number] Date of Birth: 21-May-1950  Today's Date: 01/25/2021 Time: SLP Start Time (ACUTE ONLY): 6283 SLP Stop Time (ACUTE ONLY): 1517 SLP Time Calculation (min) (ACUTE ONLY): 10 min  Past Medical History:  Past Medical History:  Diagnosis Date   Anemia    Anxiety    Atrial flutter ([place]) 02/22/2015   C. difficile colitis 10/10/2011   Chronic diarrhea    Chronic pain    Depression    ESRD on hemodialysis [facility])    Dialysis [name]   GERD (gastroesophageal reflux disease)    HIV (human immunodeficiency virus infection) ([place])    Hypertension    Insomnia    Intracranial hemorrhage (HCC)    Muscle weakness (generalized)    Pulmonary HTN (HCC)    Tachycardia    TIA (transient ischemic attack)    [facility] do not have this dx   Traumatic hematoma of right upper arm 02/22/2015   Past Surgical History:  Past Surgical History:  Procedure Laterality Date   A/V FISTULAGRAM N/A 04/17/2016   Procedure: A/V Fistulagram - Right Upper;  Surgeon: Waynetta Sandy, MD;  Location: [place] CV LAB;  Service: Cardiovascular;  Laterality: N/A;   A/V FISTULAGRAM Right 08/28/2019   Procedure: A/V FISTULAGRAM;  Surgeon: Waynetta Sandy, MD;  Location: [place] CV LAB;  Service: Cardiovascular;  Laterality: Right;   ABDOMINAL HYSTERECTOMY     APPLICATION OF WOUND VAC Right 01/07/2021   Procedure: APPLICATION OF WOUND VAC;  Surgeon: Waynetta Sandy, MD;  Location: [place];  Service: Vascular;  Laterality: Right;   BIOPSY  01/02/2020   Procedure: BIOPSY;  Surgeon: Eloise Harman, DO;  Location: [facility];  Service: Endoscopy;;   BIOPSY  07/11/2020   Procedure: BIOPSY;  Surgeon: Irving Copas., MD;  Location: [place];  Service: Gastroenterology;;   BIOPSY THYROID     CARPAL TUNNEL RELEASE Right 11/16/2019   Procedure: CARPAL TUNNEL RELEASE;   Surgeon: Leanora Cover, MD;  Location: [place];  Service: Orthopedics;  Laterality: Right;   COLONOSCOPY WITH PROPOFOL N/A 01/02/2020   Surgeon: Eloise Harman, DO;nonbleeding internal hemorrhoids, diverticulosis, status post biopsies of the entire colon (benign). Due for repeat in 2026.   Dialysis Shunts     previous one removed from left arm and now present one in right arm   DIALYSIS/PERMA CATHETER INSERTION Right 04/17/2016   Procedure: dialysis Catheter Insertion central veinous;  Surgeon: Waynetta Sandy, MD;  Location: [place] CV LAB;  Service: Cardiovascular;  Laterality: Right;   ESOPHAGOGASTRODUODENOSCOPY  12/15/2010   [place]ourk: erosive reflux esophagitis, MW tear, hiatal hernia (small), gastritis with no h.pylori, possibly nsaid related   ESOPHAGOGASTRODUODENOSCOPY (EGD) WITH PROPOFOL N/A 07/11/2020    Surgeon: Irving Copas., MD; normal esophagus, 4 cm hiatal hernia, gastritis biopsied (mild chronic gastritis, negative H. pylori), single duodenal polyp (Brunner's gland hyperplasia), s/p duodenal biopsy benign, duodenal narrowing at the duodenal sweep.   EUS N/A 07/11/2020    Surgeon: Irving Copas., MD;  dilated CBD and common hepatic duct without stones or sludge in the bile duct, stones and biliary sludge in the gallbladder, pancreatic parenchyma with hyperechoic strands, pancreatic duct dilated with prominent sidebranches in the neck and body and tail, no pancreatic mass though visulalization limited, no ampullary lesion, no malignant appearing lymph node   EXCISION OF BREAST BIOPSY Right 05/04/2012   Procedure: EXCISION OF BREAST BIOPSY;  Surgeon:  Donato Heinz, MD;  Location: [facility];  Service: General;  Laterality: Right;  Right Excisional Breast Biopsy   FISTULOGRAM Right 03/26/2014   Procedure: Right Arm Fistulogram with Venoplasty Right Subclavian Vein and Inominate Vein. Debridement Fistula Ulcer;  Surgeon: Conrad Mayfield, MD;  Location: [place];   Service: Vascular;  Laterality: Right;   FISTULOGRAM Right 03/29/2017   Procedure: FISTULOGRAM COMPLEX RIGHT ARM with Balloon angioplasty;  Surgeon: Waynetta Sandy, MD;  Location: [place];  Service: Vascular;  Laterality: Right;   HEMOSTASIS CLIP PLACEMENT  07/11/2020   Procedure: HEMOSTASIS CLIP PLACEMENT;  Surgeon: Irving Copas., MD;  Location: [place];  Service: Gastroenterology;;   INCISION AND DRAINAGE Right 12/27/2020   Procedure: INCISION AND DRAINAGE OF RIGHT FOREARM;  Surgeon: Waynetta Sandy, MD;  Location: [place];  Service: Vascular;  Laterality: Right;   INCISION AND DRAINAGE Right 12/31/2020   Procedure: INCISION AND DRAINAGE RIGHT ARM WITH PLACEMNENT OF WOUND VAC;  Surgeon: Waynetta Sandy, MD;  Location: [place];  Service: Vascular;  Laterality: Right;   INCISION AND DRAINAGE Right 01/07/2021   Procedure: RIGHT ARM INCISION AND DRAINAGE;  Surgeon: Waynetta Sandy, MD;  Location: [place];  Service: Vascular;  Laterality: Right;   IR FLUORO GUIDE CV LINE LEFT  12/26/2020   IR FLUORO GUIDE CV LINE LEFT  12/30/2020   IR GENERIC HISTORICAL  05/19/2016   IR REMOVAL TUN CV CATH W/O FL 05/19/2016 Markus Daft, MD [facility]-INTERV RAD   IR US GUIDE VASC ACCESS LEFT  12/26/2020   IR US GUIDE VASC ACCESS LEFT  12/30/2020   LIGATION OF ARTERIOVENOUS  FISTULA Right 11/12/2020   Procedure: LIGATION OF ARTERIOVENOUS  FISTULA WITH EXCISION OF NECROTIC TISSUE;  Surgeon: Waynetta Sandy, MD;  Location: [place];  Service: Vascular;  Laterality: Right;   LIGATION OF ARTERIOVENOUS  FISTULA Right 12/27/2020   Procedure: LIGATION OF ARTERIOVENOUS  FISTULA;  Surgeon: Waynetta Sandy, MD;  Location: [place];  Service: Vascular;  Laterality: Right;   PERIPHERAL VASCULAR BALLOON ANGIOPLASTY Right 04/17/2016   Procedure: Peripheral Vascular Balloon Angioplasty;  Surgeon: Waynetta Sandy, MD;  Location: [place] CV LAB;  Service: Cardiovascular;   Laterality: Right;  Central segment AV Fistula   PERIPHERAL VASCULAR BALLOON ANGIOPLASTY Right 03/14/2018   Procedure: PERIPHERAL VASCULAR BALLOON ANGIOPLASTY;  Surgeon: Waynetta Sandy, MD;  Location: [place] CV LAB;  Service: Cardiovascular;  Laterality: Right;  subclavian vein   PERIPHERAL VASCULAR BALLOON ANGIOPLASTY Right 08/28/2019   Procedure: PERIPHERAL VASCULAR BALLOON ANGIOPLASTY;  Surgeon: Waynetta Sandy, MD;  Location: [place] CV LAB;  Service: Cardiovascular;  Laterality: Right;  upper arm   PERIPHERAL VASCULAR BALLOON ANGIOPLASTY Right 11/11/2020   Procedure: PERIPHERAL VASCULAR BALLOON ANGIOPLASTY;  Surgeon: Waynetta Sandy, MD;  Location: [place] CV LAB;  Service: Cardiovascular;  Laterality: Right;   PERIPHERAL VASCULAR INTERVENTION Right 03/14/2018   Procedure: PERIPHERAL VASCULAR INTERVENTION;  Surgeon: Waynetta Sandy, MD;  Location: [place] CV LAB;  Service: Cardiovascular;  Laterality: Right;  subclavian vein   POLYPECTOMY  07/11/2020   Procedure: POLYPECTOMY;  Surgeon: Mansouraty, Telford Nab., MD;  Location: [facility] ENDOSCOPY;  Service: Gastroenterology;;   REVISON OF ARTERIOVENOUS FISTULA Right 11/12/2020   Procedure: CREATION OF RIGHT ARM ARTERIOVENOUS FISTULA;  Surgeon: Waynetta Sandy, MD;  Location: [place];  Service: Vascular;  Laterality: Right;   SHUNTOGRAM Right 10/19/2013   Procedure: FISTULOGRAM;  Surgeon: Conrad Benton, MD;  Location: [facility] CATH LAB;  Service: Cardiovascular;  Laterality: Right;   SKIN SPLIT GRAFT Right 01/07/2021   Procedure: MYRIAD SKIN GRAFT PLACEMENT;  Surgeon: Waynetta Sandy, MD;  Location: [place];  Service: Vascular;  Laterality: Right;   TONSILLECTOMY     VISCERAL VENOGRAPHY Right 03/14/2018   Procedure: CENTRAL VENOGRAPHY;  Surgeon: Waynetta Sandy, MD;  Location: [place] CV LAB;  Service: Cardiovascular;  Laterality: Right;  upper ext fistula   HPI:  [name] is a 70 y.o. female with medical history significant for ESRD on HD TTS, GERD, TIA, HIV, atrial flutter on Eliquis, anemia of chronic disease, chronic pain. Admitted from hemodialysis with syncopal episode, altered mentation, elevated blood pressure readings and seizure activity overnight.  MRI no acute CVA and findings concerning for PRES. BSE 11/20/20 prolonged mastication, recommended Dys 2, thin and upgraded to regular several days later.    Assessment / Plan / Recommendation  Clinical Impression  Ceana was awake although non responsive for commands throughout swallow assessment. Open mouth posture with hypotonia of lower lip and coughing on secretions noted on arrival. Candidia observed on lingual surface during oral care. She was unable to form seal around straw and with water pipped in with straw and fell from oral cavity. With applesauce she did move tongue slightly with therapist assisting to close mandible, adduct lips and initiated multiple swallows although small amount received. Pharyngeal swallows were unremarkable. Teriyah should remain NPO with continued oral care and as encephalopathy diminishes her ability to consume po's should improve. ST will follow. SLP Visit Diagnosis: Dysphagia, unspecified (R13.10)    Aspiration Risk  Severe aspiration risk    Diet Recommendation NPO   Medication Administration: Via alternative means    Other  Recommendations Oral Care Recommendations: Oral care QID    Recommendations for follow up therapy are one component of a multi-disciplinary discharge planning process, led by the attending physician.  Recommendations may be updated based on patient status, additional functional criteria and insurance authorization.  Follow up Recommendations Skilled nursing-short term rehab (<3 hours/day)      Assistance Recommended at Discharge    Functional Status Assessment    Frequency and Duration min 2x/week  2 weeks       Prognosis Prognosis  for Safe Diet Advancement: Good      Swallow Study   General Date of Onset: 01/23/21 HPI: [name] is a 70 y.o. female with medical history significant for ESRD on HD TTS, GERD, TIA, HIV, atrial flutter on Eliquis, anemia of chronic disease, chronic pain. Admitted from hemodialysis with syncopal episode, altered mentation, elevated blood pressure readings and seizure activity overnight.  MRI no acute CVA and findings concerning for PRES. BSE 11/20/20 prolonged mastication, recommended Dys 2, thin and upgraded to regular several days later. Type of Study: Bedside Swallow Evaluation Diet Prior to this Study: NPO Temperature Spikes Noted: No Respiratory Status: Room air History of Recent Intubation: No Behavior/Cognition: Requires cueing;Cooperative;Confused;Other (Comment) (awake) Oral Cavity Assessment: Other (comment) (lingual candidias) Oral Care Completed by SLP: Yes Oral Cavity - Dentition: Edentulous Self-Feeding Abilities: Total assist Patient Positioning: Upright in bed Baseline Vocal Quality:  (no vocalizations) Volitional Cough: Cognitively unable to elicit Volitional Swallow: Unable to elicit    Oral/Motor/Sensory Function Overall Oral Motor/Sensory Function: Generalized oral weakness (lingual hypotonia)   Ice Chips Ice chips: Not tested ((ice machine on unit was broken))   Thin Liquid Thin Liquid: Impaired Presentation: Straw (piped in) Oral Phase Impairments: Reduced labial seal;Reduced lingual movement/coordination;Poor awareness of  bolus Oral Phase Functional Implications: Right anterior spillage;Left anterior spillage Pharyngeal  Phase Impairments:  (water spilled from oral cavity)    Nectar Thick Nectar Thick Liquid: Not tested   Honey Thick Honey Thick Liquid: Not tested   Puree Puree: Impaired Presentation: Spoon Oral Phase Impairments: Reduced labial seal;Reduced lingual movement/coordination;Poor awareness of bolus Oral Phase Functional Implications: Oral  residue Pharyngeal Phase Impairments:  (none)   Solid     Solid: Not tested      Houston Siren 01/25/2021,9:41 AM

## 2021-01-25 NOTE — Progress Notes (Signed)
Dialysis contacted, Nephrology contacted, pt to receive dialysis TOMORROW instead of today. Discussed with Dr Tacy Learn. AEDs and Vanc that are ordered to be given after dialysis moved to tomorrow. Insulin and Dextrose given for potassium.

## 2021-01-25 NOTE — Progress Notes (Signed)
Heparin gtt restarted at prior rate per VO Dr Tacy Learn.

## 2021-01-25 NOTE — Progress Notes (Signed)
NEUROLOGY CONSULTATION PROGRESS NOTE   Date of service: January 25, 2021 Patient Name: [name] MRN:  [id number] DOB:  December 20, 1950  Brief HPI  [name] is a 70 y.o. female with PMH significant for ESRD on HD TTS, HIV, atrial flutter on Eliquis, anemia of chronic disease, chronic  pain, recent MRSA infection involving right AV fistula status post washout currently on vancomycin with wound VAC who presented after syncope during dialysis.  She was initially admitted to [facility]. She was initially alert and oriented however gradually her mental status worsened and she was also noted to have seizure-like activity lasting about 30 minutes.  Patient was given IV Ativan after which seizure stopped.  She was also started on Keppra.  Workup with MRI Brain w/o contrast concerning for PRES for which she was started on Clevidipine gtt. Routine EEG was notable for triphasic waves along ictal-interictal continuum. She was transferred for [facility] Neurology evaluation and for a cEEG.   Interval Hx   Continues to be encephalopathic and not interactive.  Vitals   Vitals:   01/25/21 0000 01/25/21 0100 01/25/21 0200 01/25/21 0300  BP: (!) 107/55 (!) 101/59 (!) 115/58 (!) 102/56  Pulse: 66 68 67 63  Resp: 16 14 13 14   Temp:      TempSrc:      SpO2: 92% 94% 94% 93%  Weight:         Body mass index is 17.22 kg/m.  Physical Exam   General: Laying comfortably in bed; in no acute distress.  HENT: Normal oropharynx and mucosa. Normal external appearance of ears and nose.  Neck: Supple, no pain or tenderness  CV: No JVD. No peripheral edema.  Pulmonary: Symmetric Chest rise. Normal respiratory effort.  Abdomen: Soft to touch, non-tender.  Ext: No cyanosis, edema, or deformity  Skin: No rash. Normal palpation of skin.   Musculoskeletal: Normal digits and nails by inspection. No clubbing.   Neurologic Examination  Mental status/Cognition: Awake, eyes open. Does not make eye contact.  Does not interact. No speech. Speech/language: No speech. Cranial nerves:   CN II R pupil is slightly larger than left pupil both re reactive. Corneas are clouded BL. Does not blink to threat.   CN III,IV,VI EOM intact to dolls eyes, no gaze preference or deviation, no nystagmus   CN V    CN VII no asymmetry, no nasolabial fold flattening, symmetric facial grimace.   CN VIII    CN IX & X    CN XI    CN XII    Motor:  Muscle bulk: poor, tone normal, tremor None Has spontaneous movement in all extremities. Turns around in the bed herself.  Reflexes:  Right Left Comments  Pectoralis      Biceps (C5/6) 2+ 2+   Brachioradialis (C5/6) 2 2    Triceps (C6/7) 2 2    Patellar (L3/4) 0 0    Achilles (S1) 0 0    Hoffman      Plantar mute mute   Jaw jerk    Sensation:  Light touch Localizes to pinch in all extremities.   Pin prick    Temperature    Vibration   Proprioception    Coordination/Complex Motor:  Unable to assess but no obvious ataxia. - Gait: unsafe to assess given the extent of encephalopathy.  Labs   Basic Metabolic Panel:  Lab Results  Component Value Date   NA 140 01/25/2021   K 5.8 (H) 01/25/2021   CO2 24  01/25/2021   GLUCOSE 96 01/25/2021   BUN 49 (H) 01/25/2021   CREATININE 10.46 (H) 01/25/2021   CALCIUM 8.4 (L) 01/25/2021   GFRNONAA 4 (L) 01/25/2021   GFRAA 9 (L) 10/22/2019   HbA1c: No results found for: HGBA1C LDL:  Lab Results  Component Value Date   LDLCALC 66 12/22/2019   Urine Drug Screen: No results found for: LABOPIA, COCAINSCRNUR, LABBENZ, AMPHETMU, THCU, LABBARB  Alcohol Level No results found for: ETH No results found for: PHENYTOIN, ZONISAMIDE, LAMOTRIGINE, LEVETIRACETA No results found for: PHENYTOIN, PHENOBARB, VALPROATE, CBMZ  Imaging and Diagnostic studies   MRI Brain w/o Contrast:  Negative for acute infarct Cortical and subcortical edema in the occipital parietal lobes bilaterally, right for slightly greater than left. This  is not present on the prior MRI of 11/18/2020. Findings may be due to PRES. Correlate with hypertension history.   Impression   [name] is a 70 y.o. female with with multiple medical comorbidities as noted above who presented with transient alteration of awareness during dialysis.  After arrival she was initially alert and oriented however gradually her mental status worsened and she was also noted to have seizure-like activity lasting about 30 minutes overnight.  MRI Brain is concerning for PRES and routine EEG with Triphasic waves along ictal-interictal continuum. Suspect that this explains her presentation but meningitis still needs to be ruled out specially with her being HIV positive and patient still not back to her baseline.  Recommendations  - I ordered cEEG - continue Keppra 500mg  BID - Recommend LP at bedside. Hold Heparin gtt for atleast 4 hours prior to attempting. CSF studies have been ordered. - Neurology will continue to follow along. _________________________________________________________________   This patient is critically ill and at significant risk of neurological worsening, death  and care requires constant monitoring of vital signs, hemodynamics,respiratory and cardiac monitoring, neurological assessment, discussion with family, other specialists and medical decision making of high complexity. I spent 35 minutes of neurocritical care time  in the care of  this patient. This was time spent independent of any time provided by nurse practitioner or PA.  Donnetta Simpers [facility] Pager Number [phone] 01/25/2021  6:08 AM    Thank you for the opportunity to take part in the care of this patient. If you have any further questions, please contact the neurology consultation attending.  Signed,  [place] Pager Number [phone]

## 2021-01-25 NOTE — Consult Note (Signed)
Palliative Medicine Inpatient Consult Note  Reason for consult:  Goals of Care  HPI:  Per intake H&P 11/24 by Dr. Manuella Ghazi " ALAISHA [name] is a 70 y.o. female with medical history significant for ESRD on HD TTS, HIV, atrial flutter on Eliquis, anemia of chronic disease, chronic pain, and recent MRSA infection involving right AV fistula status post washout and currently on IV vancomycin with wound VAC.  She was apparently at dialysis earlier today during which time she experienced loss of consciousness for an unknown period of time.  Apparently about 500 cc of fluid was taken off before the event.  No seizure activity was otherwise noted.  No neurological deficits noted.  She was also complaining of some all over body pain for which she was given some nitroglycerin. She continues to have ongoing confusion that is intermittent.  No significant deficits otherwise noted as far as weakness, loss of sensation, or change in speech.  She has some mild lightheadedness and nausea.  She currently resides at [facility] due to the need for close wound management as well as IV antibiotics with hemodialysis."   Per H & P 11/26 by Dr. Kellie Simmering 70 year old African American female transferred from [place], where she presented with a chief complaint of syncope while on hemodialysis.  She had also reportedly had recent development of intermittent confusion.  She was admitted to the Hospitalist service but was subsequently observed to have a seizure episode.  Neurology advised transfer to [facility].  She was also noted to be hypertension.  MRI was obtained and raised the possibility of PRES.  She was started on Cardene infusion, which was subsequently discontinued for over-correction of blood pressure.  Nonetheless, PCCM was consulted in light of Cardene infusion for malignant hypertension.  Of note, the patient was also found to have elevated TSH and elevated T4; no known history of hyperthyroidism.  MRI did not  show a definite pituitary macroadenoma."  Clinical Assessment/Goals of Care: I have reviewed medical records including EPIC notes, labs and imaging, and received report from bedside RN Margaretha Sheffield. EEG was in progress.    I met with [name]'s daughter, [name], via phone to further discuss diagnosis prognosis, GOC, EOL wishes, disposition and options. [name] is at the beach on vacation and will be back 11/30.    I introduced Palliative Medicine as specialized medical care for people living with serious illness. It focuses on providing relief from the symptoms and stress of a serious illness. The goal is to improve quality of life for both the patient and the family.  [name] has been residing at [place]. Her daughter lives in [place], [place].   A detailed discussion was had today regarding advanced directives.  Concepts specific to code status, artifical feeding and hydration, continued IV antibiotics and rehospitalization was had.  The difference between a aggressive medical intervention path  and a palliative comfort care path for this patient at this time was had. Values and goals of care important to patient and family were attempted to be elicited. I affirmed DNR status. Full scope of treatment is desired currently until test results are available.  Discussed the importance of continued conversation with family and their  medical providers regarding overall plan of care and treatment options, ensuring decisions are within the context of the patients values and GOCs.  Decision Maker: Patient is unable to make decisions; daughter, [name] [phone], [phone]  SUMMARY OF RECOMMENDATIONS    Code  Status/Advance Care Planning: DNAR/DNI  Symptom Management:  Pain- acetaminophen prn Nausea- ondansetron prn  Palliative Prophylaxis:  GI prophylaxis- pantoprazole Constipation- senna prn  Additional Recommendations (Limitations, Scope, Preferences): Full scope of  care other than DNR until results of test are available for further discussion   Psycho-social/Spiritual:  Desire for further Chaplaincy support: yes, Christian, consult placed Additional Recommendations: Follow for symptom management and further discussion once LP and EEG test results are available. Daughter is on vacation until 11/30   Discharge Planning: TBD   Vitals with BMI 01/25/2021 01/25/2021 01/25/2021  Height - - -  Weight - - -  BMI - - -  Systolic 353 (No Data) 614  Diastolic 71 (No Data) 88  Pulse 63 64 64    PPS: 20%    Thank you for the opportunity to participate in the care of this patient and family.   Time In:12:00 Time Out: 12:50 Total Time: 50 minutes Greater than 50%  of this time was spent counseling and coordinating care related to the above assessment and plan.  Lindell Spar, NP [facility] Palliative Medicine Team Team Cell Phone: [phone] Please utilize secure chat with additional questions, if there is no response within 30 minutes please call the above phone number  Palliative Medicine Team providers are available by phone from 7am to 7pm daily and can be reached through the team cell phone.  Should this patient require assistance outside of these hours, please call the patient's attending physician.

## 2021-01-25 NOTE — Progress Notes (Signed)
ANTICOAGULATION CONSULT NOTE - Initial Consult  Pharmacy Consult for heparin Indication:  Aflutter  Allergies  Allergen Reactions   Lactose Intolerance (Gi) Other (See Comments)    On MAR   Latex Rash and Itching   Penicillins Rash and Swelling    Has patient had a PCN reaction causing immediate rash, facial/tongue/throat swelling, SOB or lightheadedness with hypotension: No Has patient had a PCN reaction causing severe rash involving mucus membranes or skin necrosis: No Has patient had a PCN reaction that required hospitalization No Has patient had a PCN reaction occurring within the last 10 years: No If all of the above answers are "NO", then may proceed with Cephalosporin use.      Patient Measurements: Weight: 43.4 kg (95 lb 10.9 oz)  Vital Signs: Temp: 98.1 F (36.7 C) (11/25 2045) Temp Source: Axillary (11/25 2045) BP: 158/81 (11/26 0430) Pulse Rate: 63 (11/26 0430)  Labs: Recent Labs    01/23/21 1016 01/23/21 1208 01/24/21 0134 01/24/21 1054 01/24/21 1331 01/24/21 1440 01/25/21 0209  HGB 10.1*  --  11.5*  --  10.2*  --  8.7*  HCT 34.4*  --  40.1  --  34.0*  --  28.8*  PLT 147*  --  131*  --  143*  --  127*  LABPROT  --   --   --  15.3*  --   --   --   INR  --   --   --  1.2  --   --   --   CREATININE 6.80*  --  8.31*  --   --  9.38* 10.46*  TROPONINIHS 35* 39*  --   --   --   --   --     Estimated Creatinine Clearance: 3.6 mL/min (A) (by C-G formula based on SCr of 10.46 mg/dL (H)).   Medical History: Past Medical History:  Diagnosis Date   Anemia    Anxiety    Atrial flutter ([place]) 02/22/2015   C. difficile colitis 10/10/2011   Chronic diarrhea    Chronic pain    Depression    ESRD on hemodialysis [facility])    Dialysis [name]   GERD (gastroesophageal reflux disease)    HIV (human immunodeficiency virus infection) ([place])    Hypertension    Insomnia    Intracranial hemorrhage (HCC)    Muscle weakness (generalized)    Pulmonary HTN (HCC)     Tachycardia    TIA (transient ischemic attack)    [facility] do not have this dx   Traumatic hematoma of right upper arm 02/22/2015    Medications:  Medications Prior to Admission  Medication Sig Dispense Refill Last Dose   Amino Acids-Protein Hydrolys (PRO-STAT 64 PO) Take by mouth. Give 74ml by mouth in the morning for wound healing   01/23/2021   apixaban (ELIQUIS) 2.5 MG TABS tablet Take 1 tablet (2.5 mg total) by mouth 2 (two) times daily. 60 tablet 0 01/23/2021 at 0800   baclofen (LIORESAL) 10 MG tablet Take 10 mg by mouth daily as needed for muscle spasms.   Past Month   clopidogrel (PLAVIX) 75 MG tablet Take 1 tablet (75 mg total) by mouth daily with breakfast. 30 tablet  Past Month   collagenase (SANTYL) ointment Apply 1 application topically daily. Apply to sacrum topically every day shift for wound healing      cyanocobalamin (,VITAMIN B-12,) 1000 MCG/ML injection Inject 1,000 mcg into the muscle every 30 (thirty) days. On  the 28th of every month   Past Month   dolutegravir (TIVICAY) 50 MG tablet Take 50 mg by mouth daily at 4 PM.   Past Week   Emollient (MINERIN) LOTN Apply 1 application topically every 12 (twelve) hours as needed (dry skin). Apply to feet and ankles      fentaNYL (DURAGESIC) 25 MCG/HR Place 1 patch onto the skin every 3 (three) days. 3 patch 0 Past Week   lamivudine (EPIVIR) 100 MG tablet Take 50 mg by mouth daily. (0800)   01/23/2021   Lidocaine HCl 4 % CREA Apply 1 application topically every 12 (twelve) hours as needed (pain).      lidocaine-prilocaine (EMLA) cream Apply 1 application topically See admin instructions. (0900) Apply to fistula site one hour before dialysis on Tuesday, Thursday, and Saturday.   Past Month   megestrol (MEGACE) 400 MG/10ML suspension Take 400 mg by mouth daily.   01/23/2021   melatonin 3 MG TABS tablet Take 3 mg by mouth at bedtime. (2000)   Past Week   methadone (DOLOPHINE) 5 MG tablet Take 0.5 tablets (2.5 mg  total) by mouth daily at 8 pm. (2000) 3 tablet 0 Past Week   metoprolol tartrate (LOPRESSOR) 25 MG tablet Take 1 tablet (25 mg total) by mouth 2 (two) times daily. 60 tablet 0 01/23/2021 at 0800   mirtazapine (REMERON) 15 MG tablet Take 15 mg by mouth at bedtime.   Past Month   nitroGLYCERIN (NITROSTAT) 0.4 MG SL tablet Place 0.4 mg under the tongue every 5 (five) minutes x 3 doses as needed for chest pain.    unk at unk   ondansetron (ZOFRAN ODT) 8 MG disintegrating tablet Take 1 tablet (8 mg total) by mouth every 8 (eight) hours as needed for nausea or vomiting. 30 tablet 3 Past Month   Oxycodone HCl 10 MG TABS Take 1 tablet (10 mg total) by mouth every 12 (twelve) hours as needed (severe pain). 4 tablet 0 Past Week   pantoprazole (PROTONIX) 40 MG tablet Take 1 tablet (40 mg total) by mouth daily before breakfast. 30 tablet 3 01/23/2021   promethazine (PHENERGAN) 12.5 MG tablet Take 12.5 mg by mouth See admin instructions. Take 1 tablet (12.5 mg) by mouth in the morning every Tuesday, Thursday and Saturday for nausea/vomiting and every 6 hours as needed for n/v   01/23/2021   senna (SENOKOT) 8.6 MG tablet Take 2 tablets by mouth every evening. (2100)   Past Week   vancomycin (VANCOCIN) 500-5 MG/100ML-% IVPB Inject 100 mLs (500 mg total) into the vein Every Tuesday,Thursday,and Saturday with dialysis. 100 mL 0 01/23/2021   venlafaxine XR (EFFEXOR-XR) 150 MG 24 hr capsule Take 150 mg by mouth daily with breakfast. (0900)   01/23/2021   vitamin C (ASCORBIC ACID) 500 MG tablet Take 500 mg by mouth 2 (two) times daily.   01/23/2021   zidovudine (RETROVIR) 100 MG capsule Take 100 mg by mouth 3 (three) times daily. (0900, 1300, & 2100)   01/23/2021   Zinc Sulfate (ZINC-220 PO) Take 1 tablet by mouth daily.   01/23/2021   Multiple Vitamin (MULTIVITAMIN WITH MINERALS) TABS tablet Take 1 tablet by mouth at bedtime. (2000)      Scheduled:   calcitRIOL  1.25 mcg Oral Q T,Th,Sa-HD   Chlorhexidine Gluconate  Cloth  6 each Topical Daily   Chlorhexidine Gluconate Cloth  6 each Topical Q0600   Chlorhexidine Gluconate Cloth  6 each Topical Q0600   cinacalcet  90 mg Oral  Q T,Th,Sa-HD   clopidogrel  75 mg Oral Q breakfast   collagenase  1 application Topical Daily   [START ON 01/27/2021] cyanocobalamin  1,000 mcg Intramuscular Q30 days   dolutegravir  50 mg Oral q1600   lamiVUDine  50 mg Oral Daily   lidocaine-prilocaine  1 application Topical Q T,Th,Sa-HD   methimazole  5 mg Oral TID   metoprolol tartrate  50 mg Oral BID   pantoprazole  40 mg Oral QAC breakfast   senna  2 tablet Oral QPM   sodium chloride flush  3 mL Intravenous Q12H   vancomycin  500 mg Intravenous Q T,Th,Sa-HD   zidovudine  100 mg Oral TID   Infusions:   sodium chloride     levETIRAcetam 500 mg (01/24/21 2233)   niCARDipine Stopped (01/24/21 2119)    Assessment: 70yo female admitted to [facility] after syncopal event at HD, now tx'd to [facility] ICU for LTM EEG, to transition from Eliquis for Aflutter to UFH; last dose of Eliquis was taken 11/24, had been ordered on admission at [facility] but no doses have been charted.  Goal of Therapy:  Heparin level 0.3-0.7 units/ml aPTT 66-102 seconds Monitor platelets by anticoagulation protocol: Yes   Plan:  Start heparin infusion at 900 units/hr (based on recent heparin requirements) and monitor heparin levels, aPTT (while Eliquis affects anti-Xa), and CBC.  Wynona Neat, PharmD, BCPS  01/25/2021,5:11 AM

## 2021-01-25 NOTE — Progress Notes (Signed)
71 year old female was transferred from [facility] emergency department after she presented with syncopal episode postdialysis  Also she was noted to be in hypertensive emergency leading to PRES. MRI brain confirmed edema of bilateral occipital lobes left greater than right, patient remained altered and spiked fever of 102, she has history of HIV, last viral load was less than 20 and CD4 count 252 in September 2022.  With altered mental status and fever there is a concern of acute meningitis/encephalitis, will perform LP later today  Appreciate neurology input, infectious disease was curb sided regarding other CSF studies if we can send  Patient is lying on the bed and, opens eyes with vocal stimuli, does not follow commands, she has wound secretion in back of throat, suction.  She does have strong gag reflex.  Patient has history of chronic A. fib, currently in sinus rhythm with heart rate in 50s We will stop IV heparin infusion for LP which will be performed later today.  For PRES, will keep tight blood pressure control SBP goal less than 140, she will be started on Cardene infusion   Additional critical care time: 32 minutes  Performed by: [place] care time was exclusive of separately billable procedures and treating other patients.   Critical care was necessary to treat or prevent imminent or life-threatening deterioration.   Critical care was time spent personally by me on the following activities: development of treatment plan with patient and/or surrogate as well as nursing, discussions with consultants, evaluation of patient's response to treatment, examination of patient, obtaining history from patient or surrogate, ordering and performing treatments and interventions, ordering and review of laboratory studies, ordering and review of radiographic studies, pulse oximetry and re-evaluation of patient's condition.   Jacky Kindle MD [facility] See  Amion for pager If no response to pager, please call [phone] until 7pm After 7pm, Please call E-link [phone] \

## 2021-01-25 NOTE — Progress Notes (Signed)
Patient's daughter Brihanna Devenport updated of patient's transfer within the next few hours to [id number]. All questions answered and updated on patient's current condition.  No further questions at this time.

## 2021-01-25 NOTE — Progress Notes (Signed)
ANTICOAGULATION CONSULT NOTE - Initial Consult  Pharmacy Consult for heparin Indication:  Aflutter  Allergies  Allergen Reactions   Lactose Intolerance (Gi) Other (See Comments)    On MAR   Latex Rash and Itching   Penicillins Rash and Swelling    Has patient had a PCN reaction causing immediate rash, facial/tongue/throat swelling, SOB or lightheadedness with hypotension: No Has patient had a PCN reaction causing severe rash involving mucus membranes or skin necrosis: No Has patient had a PCN reaction that required hospitalization No Has patient had a PCN reaction occurring within the last 10 years: No If all of the above answers are "NO", then may proceed with Cephalosporin use.      Patient Measurements: Weight: 43.4 kg (95 lb 10.9 oz)  Vital Signs: Temp: 98.1 F (36.7 C) (11/26 1200) Temp Source: Axillary (11/26 1200) BP: 150/71 (11/26 0700) Pulse Rate: 63 (11/26 0700)  Labs: Recent Labs    01/23/21 1016 01/23/21 1208 01/24/21 0134 01/24/21 1054 01/24/21 1331 01/24/21 1440 01/25/21 0209  HGB 10.1*  --  11.5*  --  10.2*  --  8.7*  HCT 34.4*  --  40.1  --  34.0*  --  28.8*  PLT 147*  --  131*  --  143*  --  127*  LABPROT  --   --   --  15.3*  --   --   --   INR  --   --   --  1.2  --   --   --   CREATININE 6.80*  --  8.31*  --   --  9.38* 10.46*  TROPONINIHS 35* 39*  --   --   --   --   --     Estimated Creatinine Clearance: 3.6 mL/min (A) (by C-G formula based on SCr of 10.46 mg/dL (H)).   Medical History: Past Medical History:  Diagnosis Date   Anemia    Anxiety    Atrial flutter ([place]) 02/22/2015   C. difficile colitis 10/10/2011   Chronic diarrhea    Chronic pain    Depression    ESRD on hemodialysis [facility])    Dialysis [name]   GERD (gastroesophageal reflux disease)    HIV (human immunodeficiency virus infection) ([place])    Hypertension    Insomnia    Intracranial hemorrhage (HCC)    Muscle weakness (generalized)    Pulmonary HTN (HCC)     Tachycardia    TIA (transient ischemic attack)    [facility] do not have this dx   Traumatic hematoma of right upper arm 02/22/2015    Medications:  Medications Prior to Admission  Medication Sig Dispense Refill Last Dose   Amino Acids-Protein Hydrolys (PRO-STAT 64 PO) Take by mouth. Give 83ml by mouth in the morning for wound healing   01/23/2021   apixaban (ELIQUIS) 2.5 MG TABS tablet Take 1 tablet (2.5 mg total) by mouth 2 (two) times daily. 60 tablet 0 01/23/2021 at 0800   baclofen (LIORESAL) 10 MG tablet Take 10 mg by mouth daily as needed for muscle spasms.   Past Month   clopidogrel (PLAVIX) 75 MG tablet Take 1 tablet (75 mg total) by mouth daily with breakfast. 30 tablet  Past Month   collagenase (SANTYL) ointment Apply 1 application topically daily. Apply to sacrum topically every day shift for wound healing      cyanocobalamin (,VITAMIN B-12,) 1000 MCG/ML injection Inject 1,000 mcg into the muscle every 30 (thirty) days. On  the 28th of every month   Past Month   dolutegravir (TIVICAY) 50 MG tablet Take 50 mg by mouth daily at 4 PM.   Past Week   Emollient (MINERIN) LOTN Apply 1 application topically every 12 (twelve) hours as needed (dry skin). Apply to feet and ankles      fentaNYL (DURAGESIC) 25 MCG/HR Place 1 patch onto the skin every 3 (three) days. 3 patch 0 Past Week   lamivudine (EPIVIR) 100 MG tablet Take 50 mg by mouth daily. (0800)   01/23/2021   Lidocaine HCl 4 % CREA Apply 1 application topically every 12 (twelve) hours as needed (pain).      lidocaine-prilocaine (EMLA) cream Apply 1 application topically See admin instructions. (0900) Apply to fistula site one hour before dialysis on Tuesday, Thursday, and Saturday.   Past Month   megestrol (MEGACE) 400 MG/10ML suspension Take 400 mg by mouth daily.   01/23/2021   melatonin 3 MG TABS tablet Take 3 mg by mouth at bedtime. (2000)   Past Week   methadone (DOLOPHINE) 5 MG tablet Take 0.5 tablets (2.5 mg  total) by mouth daily at 8 pm. (2000) 3 tablet 0 Past Week   metoprolol tartrate (LOPRESSOR) 25 MG tablet Take 1 tablet (25 mg total) by mouth 2 (two) times daily. 60 tablet 0 01/23/2021 at 0800   mirtazapine (REMERON) 15 MG tablet Take 15 mg by mouth at bedtime.   Past Month   nitroGLYCERIN (NITROSTAT) 0.4 MG SL tablet Place 0.4 mg under the tongue every 5 (five) minutes x 3 doses as needed for chest pain.    unk at unk   ondansetron (ZOFRAN ODT) 8 MG disintegrating tablet Take 1 tablet (8 mg total) by mouth every 8 (eight) hours as needed for nausea or vomiting. 30 tablet 3 Past Month   Oxycodone HCl 10 MG TABS Take 1 tablet (10 mg total) by mouth every 12 (twelve) hours as needed (severe pain). 4 tablet 0 Past Week   pantoprazole (PROTONIX) 40 MG tablet Take 1 tablet (40 mg total) by mouth daily before breakfast. 30 tablet 3 01/23/2021   promethazine (PHENERGAN) 12.5 MG tablet Take 12.5 mg by mouth See admin instructions. Take 1 tablet (12.5 mg) by mouth in the morning every Tuesday, Thursday and Saturday for nausea/vomiting and every 6 hours as needed for n/v   01/23/2021   senna (SENOKOT) 8.6 MG tablet Take 2 tablets by mouth every evening. (2100)   Past Week   vancomycin (VANCOCIN) 500-5 MG/100ML-% IVPB Inject 100 mLs (500 mg total) into the vein Every Tuesday,Thursday,and Saturday with dialysis. 100 mL 0 01/23/2021   venlafaxine XR (EFFEXOR-XR) 150 MG 24 hr capsule Take 150 mg by mouth daily with breakfast. (0900)   01/23/2021   vitamin C (ASCORBIC ACID) 500 MG tablet Take 500 mg by mouth 2 (two) times daily.   01/23/2021   zidovudine (RETROVIR) 100 MG capsule Take 100 mg by mouth 3 (three) times daily. (0900, 1300, & 2100)   01/23/2021   Zinc Sulfate (ZINC-220 PO) Take 1 tablet by mouth daily.   01/23/2021   Multiple Vitamin (MULTIVITAMIN WITH MINERALS) TABS tablet Take 1 tablet by mouth at bedtime. (2000)      Scheduled:   calcitRIOL  1.25 mcg Oral Q T,Th,Sa-HD   Chlorhexidine Gluconate  Cloth  6 each Topical Daily   Chlorhexidine Gluconate Cloth  6 each Topical Q0600   Chlorhexidine Gluconate Cloth  6 each Topical Q0600   cinacalcet  90 mg Oral  Q T,Th,Sa-HD   clopidogrel  75 mg Oral Q breakfast   collagenase  1 application Topical Daily   [START ON 01/27/2021] cyanocobalamin  1,000 mcg Intramuscular Q30 days   dolutegravir  50 mg Oral q1600   lamiVUDine  50 mg Oral Daily   lidocaine-prilocaine  1 application Topical Q T,Th,Sa-HD   methimazole  5 mg Oral TID   pantoprazole (PROTONIX) IV  40 mg Intravenous QHS   senna  2 tablet Oral QPM   sodium chloride flush  3 mL Intravenous Q12H   thiamine injection  100 mg Intravenous Daily   vancomycin  500 mg Intravenous Q T,Th,Sa-HD   zidovudine  100 mg Oral TID   Infusions:   sodium chloride 250 mL (01/25/21 6720)   heparin Stopped (01/25/21 0939)   lacosamide (VIMPAT) IV     levETIRAcetam 500 mg (01/25/21 1025)   levETIRAcetam     niCARDipine 3 mg/hr (01/25/21 9470)    Assessment: 70yo W with hypertensive emergency and PRES on apixaban PTA for aflutter, now held. Last dose of Eliquis was taken 11/24 Pharmacy consulted for heparin   Planning LP today with heparin held at least 4 hours prior- stopped at 9:39. Will hold heparin for at least 6 hours after LP per guideline. Discontinued previously planned level. Will follow up AM levels    Goal of Therapy:  Heparin level 0.3-0.7 units/ml aPTT 66-102 seconds Monitor platelets by anticoagulation protocol: Yes   Plan:  Held heparin infusion at 900 units/hr at 9:40am for LP > restart greater than 6 hours after procedure or per MD F/u aPTT until correlates with heparin level  Monitor daily aPTT, HL, CBC/plt Monitor for signs/symptoms of bleeding  F/u restart apixaban eventually  Benetta Spar, PharmD, BCPS, [facility] Clinical Pharmacist  Please check AMION for all [place] phone numbers After 10:00 PM, call [place]

## 2021-01-25 NOTE — Progress Notes (Signed)
LTM hookup done at bedside and started. No skin breakdown noted. CT /(EEG) compatibles leads used. Results pending.

## 2021-01-25 NOTE — Procedures (Signed)
Lumbar Puncture Procedure Note  [name]  [id number]  04-10-1950  Date:01/25/21  Time:4:28 PM   Provider Performing:Domonique Brouillard   Procedure: Lumbar Puncture (77412)  Indication(s) Rule out meningitis  Consent Risks of the procedure as well as the alternatives and risks of each were explained to the patient and/or caregiver.  Consent for the procedure was obtained and is signed in the bedside chart  Anesthesia Topical only with 1% lidocaine    Time Out Verified patient identification, verified procedure, site/side was marked, verified correct patient position, special equipment/implants available, medications/allergies/relevant history reviewed, required imaging and test results available.   Sterile Technique Maximal sterile technique including sterile barrier drape, hand hygiene, sterile gown, sterile gloves, mask, hair covering.    Procedure Description Using palpation, approximate location of L3-L4 space identified.   Lidocaine used to anesthetize skin and subcutaneous tissue overlying this area.  A 20g spinal needle was then used to access the subarachnoid space. Opening pressure: 18 cm H2O. Closing pressure:Not obtained. 16cc CSF obtained.  Complications/Tolerance None; patient tolerated the procedure well.   EBL Minimal   Specimen(s) CSF

## 2021-01-25 NOTE — Progress Notes (Signed)
BP still above goal despite maxed on cardene. Discussed with MD, Changed to cleviprex per order.

## 2021-01-26 DIAGNOSIS — R410 Disorientation, unspecified: Secondary | ICD-10-CM | POA: Diagnosis not present

## 2021-01-26 DIAGNOSIS — I10 Essential (primary) hypertension: Secondary | ICD-10-CM | POA: Diagnosis not present

## 2021-01-26 DIAGNOSIS — R569 Unspecified convulsions: Secondary | ICD-10-CM | POA: Diagnosis not present

## 2021-01-26 DIAGNOSIS — I6783 Posterior reversible encephalopathy syndrome: Secondary | ICD-10-CM | POA: Diagnosis not present

## 2021-01-26 DIAGNOSIS — N186 End stage renal disease: Secondary | ICD-10-CM | POA: Diagnosis not present

## 2021-01-26 DIAGNOSIS — G9340 Encephalopathy, unspecified: Secondary | ICD-10-CM | POA: Diagnosis not present

## 2021-01-26 DIAGNOSIS — B20 Human immunodeficiency virus [HIV] disease: Secondary | ICD-10-CM | POA: Diagnosis not present

## 2021-01-26 DIAGNOSIS — I4891 Unspecified atrial fibrillation: Secondary | ICD-10-CM | POA: Diagnosis not present

## 2021-01-26 LAB — CBC
HCT: 31.9 % — ABNORMAL LOW (ref 36.0–46.0)
Hemoglobin: 9.7 g/dL — ABNORMAL LOW (ref 12.0–15.0)
MCH: 30.7 pg (ref 26.0–34.0)
MCHC: 30.4 g/dL (ref 30.0–36.0)
MCV: 100.9 fL — ABNORMAL HIGH (ref 80.0–100.0)
Platelets: 139 10*3/uL — ABNORMAL LOW (ref 150–400)
RBC: 3.16 MIL/uL — ABNORMAL LOW (ref 3.87–5.11)
RDW: 18.4 % — ABNORMAL HIGH (ref 11.5–15.5)
WBC: 5.1 10*3/uL (ref 4.0–10.5)
nRBC: 0 % (ref 0.0–0.2)

## 2021-01-26 LAB — RENAL FUNCTION PANEL
Albumin: 2.8 g/dL — ABNORMAL LOW (ref 3.5–5.0)
Anion gap: 15 (ref 5–15)
BUN: 36 mg/dL — ABNORMAL HIGH (ref 8–23)
CO2: 21 mmol/L — ABNORMAL LOW (ref 22–32)
Calcium: 8.2 mg/dL — ABNORMAL LOW (ref 8.9–10.3)
Chloride: 103 mmol/L (ref 98–111)
Creatinine, Ser: 8.38 mg/dL — ABNORMAL HIGH (ref 0.44–1.00)
GFR, Estimated: 5 mL/min — ABNORMAL LOW (ref >60)
Glucose, Bld: 82 mg/dL (ref 70–99)
Phosphorus: 5.7 mg/dL — ABNORMAL HIGH (ref 2.5–4.6)
Potassium: 4.2 mmol/L (ref 3.5–5.1)
Sodium: 139 mmol/L (ref 135–145)

## 2021-01-26 LAB — T3: T3, Total: 62 ng/dL — ABNORMAL LOW (ref 71–180)

## 2021-01-26 LAB — HEPARIN LEVEL (UNFRACTIONATED): Heparin Unfractionated: 0.6 IU/mL (ref 0.30–0.70)

## 2021-01-26 LAB — INSULIN-LIKE GROWTH FACTOR: Somatomedin C: 31 ng/mL — ABNORMAL LOW (ref 52–196)

## 2021-01-26 LAB — APTT: aPTT: 108 seconds — ABNORMAL HIGH (ref 24–36)

## 2021-01-26 LAB — PROLACTIN: Prolactin: 48 ng/mL — ABNORMAL HIGH (ref 4.8–23.3)

## 2021-01-26 LAB — T3, FREE: T3, Free: 1.4 pg/mL — ABNORMAL LOW (ref 2.0–4.4)

## 2021-01-26 LAB — HEPATITIS B SURFACE ANTIBODY,QUALITATIVE: Hep B S Ab: REACTIVE — AB

## 2021-01-26 LAB — HEPATITIS B SURFACE ANTIGEN: Hepatitis B Surface Ag: NONREACTIVE

## 2021-01-26 LAB — T4: T4, Total: 7.6 ug/dL (ref 4.5–12.0)

## 2021-01-26 LAB — TRIGLYCERIDES: Triglycerides: 117 mg/dL (ref <150)

## 2021-01-26 MED ORDER — SODIUM CHLORIDE 0.9 % IV SOLN
250.0000 mg | INTRAVENOUS | Status: DC
  Administered 2021-01-28 – 2021-02-01 (×3): 250 mg via INTRAVENOUS
  Filled 2021-01-26 (×5): qty 2.5

## 2021-01-26 MED ORDER — AMIODARONE HCL IN DEXTROSE 360-4.14 MG/200ML-% IV SOLN
30.0000 mg/h | INTRAVENOUS | Status: DC
  Administered 2021-01-26 – 2021-01-28 (×6): 30 mg/h via INTRAVENOUS
  Filled 2021-01-26 (×5): qty 200

## 2021-01-26 MED ORDER — LEVETIRACETAM IN NACL 1000 MG/100ML IV SOLN
1000.0000 mg | Freq: Every day | INTRAVENOUS | Status: DC
Start: 2021-01-27 — End: 2021-02-02
  Administered 2021-01-27 – 2021-02-02 (×7): 1000 mg via INTRAVENOUS
  Filled 2021-01-26 (×8): qty 100

## 2021-01-26 MED ORDER — SODIUM CHLORIDE 0.9 % IV SOLN
150.0000 mg | Freq: Two times a day (BID) | INTRAVENOUS | Status: DC
  Administered 2021-01-26 – 2021-02-01 (×14): 150 mg via INTRAVENOUS
  Filled 2021-01-26 (×17): qty 15

## 2021-01-26 MED ORDER — AMIODARONE LOAD VIA INFUSION
150.0000 mg | Freq: Once | INTRAVENOUS | Status: AC
  Administered 2021-01-26: 05:00:00 150 mg via INTRAVENOUS
  Filled 2021-01-26: qty 83.34

## 2021-01-26 MED ORDER — LEVETIRACETAM IN NACL 500 MG/100ML IV SOLN
500.0000 mg | Freq: Every day | INTRAVENOUS | Status: DC
Start: 2021-01-27 — End: 2021-01-26

## 2021-01-26 MED ORDER — AMIODARONE HCL IN DEXTROSE 360-4.14 MG/200ML-% IV SOLN
60.0000 mg/h | INTRAVENOUS | Status: AC
  Administered 2021-01-26 (×2): 60 mg/h via INTRAVENOUS
  Filled 2021-01-26: qty 200

## 2021-01-26 MED ORDER — VANCOMYCIN HCL IN DEXTROSE 500-5 MG/100ML-% IV SOLN
500.0000 mg | INTRAVENOUS | Status: DC
  Administered 2021-01-28 – 2021-02-08 (×4): 500 mg via INTRAVENOUS
  Filled 2021-01-26 (×12): qty 100

## 2021-01-26 NOTE — Progress Notes (Signed)
[place] Progress Note Patient Name: [name] DOB: June 25, 1950 MRN: [id number]   Date of Service  01/26/2021  HPI/Events of Note  AFIB with RVR - Ventricular rate = 105 - 150. Patient already on Cleviprex IV infusion (Calcium channel agent) for BP control. Patient has a history of AFIB and is on a Heparin IV infusion.   eICU Interventions  Plan: Send AM labs now. Add Magnesium level to above labs. Amiodarone IV load and infusion.      Intervention Category Major Interventions: Arrhythmia - evaluation and management  Matheus Spiker [place] 01/26/2021, 3:57 AM

## 2021-01-26 NOTE — Progress Notes (Signed)
LTM checked due to [facility] called. Tech found computer with software error message on it. Restarted test, checked hardrive for local study, all already transferred, impedance = c4 and p3 reapplied, [facility] recalled.

## 2021-01-26 NOTE — Progress Notes (Signed)
NAME:  ZOIEY CHRISTY, MRN:  [id number], DOB:  07/05/1950, LOS: 3 ADMISSION DATE:  01/23/2021, CONSULTATION DATE:  11/25 REFERRING MD:  Manuella Ghazi , CHIEF COMPLAINT:  Syncope   History of Present Illness:  Patient is encephalopathic. Therefore history has been obtained from chart review.   MAEVE DEBORD, is a 70 y.o. 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 She had a syncopal event while on HD. 500cc fluid reported to have been removed. Upon presentation she had ongoing confusion that was intermittent. CTH negative for acute process.  She was admitted to the hospitalist service. On 11/25 the patient had a seizure while on the ward. Neuro consulted. Patient was found to be hypertensive. MRI concerning for PRES. The patient was started on a Cardene GTT. Per report she still remained altered and has not returned to baseline. Neuro has requested transfer to [facility] for LTM eeg.   PCCM was consulted for management while at [facility].   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 11/26 Arrival to [facility]  Interim History / Subjective:  Patient went into A. fib with RVR last night requiring IV amiodarone bolus followed by infusion, now heart rate is better controlled, she still remain in A. fib She received hemodialysis session last night Mental status has improved  Objective   Blood pressure 131/83, pulse 87, temperature 98.4 F (36.9 C),  temperature source Oral, resp. rate 20, weight 43.4 kg, SpO2 98 %.        Intake/Output Summary (Last 24 hours) at 01/26/2021 1145 Last data filed at 01/26/2021 0700 Gross per 24 hour  Intake 1700.04 ml  Output 1485 ml  Net 215.04 ml   Filed Weights   01/23/21 1830  Weight: 43.4 kg    Examination: Physical exam: General: Chronically ill-appearing elderly female, lying on the bed HEENT: [place]/AT, eyes anicteric.  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 Findings consistent with PRES LP was done yesterday, acute meningitis/encephalitis was ruled out Patient mental status is slightly better today EEG confirmed nonconvulsive status epilepticus yesterday, which improved after Vimpat Monitor blood pressure with SBP goal <140 Currently on Cardene infusion Avoid deep sedation Continue Vimpat and Keppra Will stop Antibiotic therapy  Paroxysmal atrial fibrillation with rapid ventricular response Patient went into A. fib with RVR last night with heart rate in 130s She was given amiodarone bolus followed by infusion Heart rate is better controlled now, remained in A. fib She is on Eliquis at home for stroke prophylaxis Continue IV  heparin infusion for now, switch back to Eliquis upon discharge  MRSA infection of right AV fistula Was receiving tx with vanc with outpatient dialysis Continue Vanc with HD, pharmacy consult is placed Continue wound care  ESRD on HD (TTS) Nephrology is  following, patient had dialysis last night  Hyperkalemia, resolved Postdialysis patient serum potassium is 4.2  HIV CD4 252 on 11/20/20 with undetected viral load Continue  antiretroviral therapy with zidovudine, lamivudine, and dolutegravir  Anemia of chronic disease, macrocytic Thrombocytopenia H&H is a stable, no signs of bleeding Transfuse PRBC if HBG less than 7 Patient B12 level came back normal, discontinue cyanocobalamin  Severe protein calorie malnutrition Hypoalbuminemia Continue dietary supplements  GERD On PPI  GOC Palliative care consulted previous admission. 01/06/21 PC discussion with patient states DNR. Limited Additional Interventions: Use medical treatment, IV fluids and cardiac monitoring as indicated, DO NOT USE intubation or mechanical ventilation. May consider use of less invasive airway support such as BiPAP or CPAP. Also provide comfort measures. Transfer to the hospital if indicated. No feeding tube  Best Practice (right click and "Reselect all SmartList Selections" daily)   Diet/type: NPO w/ oral meds DVT prophylaxis: SCD GI prophylaxis: PPI Lines: Dialysis Catheter Foley:  N/A Code Status:  DNR/DNI- See PC note on 11/7 Last date of multidisciplinary goals of care discussion [11/26, patient's daughter was updated over the phone]  Labs   CBC: Recent Labs  Lab 01/23/21 1016 01/24/21 0134 01/24/21 1331 01/25/21 0209 01/26/21 0331  WBC 3.2* 4.9 4.7 3.8* 5.1  NEUTROABS 2.4  --  3.8  --   --   HGB 10.1* 11.5* 10.2* 8.7* 9.7*  HCT 34.4* 40.1 34.0* 28.8* 31.9*  MCV 104.2* 107.8* 104.3* 102.1* 100.9*  PLT 147* 131* 143* 127* 139*    Basic Metabolic Panel: Recent Labs  Lab 01/23/21 1016 01/24/21 0134 01/24/21 1440 01/25/21 0209 01/26/21 0332  NA 137 137 139 140 139  K 4.3 4.4 6.0* 5.8* 4.2  CL 99 98 100 103 103  CO2 24 17* 24 24 21*  GLUCOSE 79 132* 125* 96 82  BUN 29* 35* 43* 49* 36*  CREATININE 6.80* 8.31* 9.38* 10.46* 8.38*  CALCIUM  8.5* 8.7* 8.5* 8.4* 8.2*  MG  --  2.3  --  2.4  --   PHOS  --   --   --  8.4* 5.7*   GFR: Estimated Creatinine Clearance: 4.5 mL/min (A) (by C-G formula based on SCr of 8.38 mg/dL (H)). Recent Labs  Lab 01/24/21 0134 01/24/21 1331 01/25/21 0209 01/26/21 0331  WBC 4.9 4.7 3.8* 5.1    Liver Function Tests: Recent Labs  Lab 01/23/21 1016 01/24/21 0134 01/24/21 1440 01/25/21 0209 01/26/21 0332  AST 17 27 20   --   --   ALT 10 11 11   --   --   ALKPHOS 87 95 84  --   --   BILITOT 0.9 0.9 0.5  --   --   PROT 7.5 8.4* 8.0  --   --   ALBUMIN 3.0* 3.3* 3.2* 2.8* 2.8*   No results for input(s): LIPASE, AMYLASE in the last 168 hours. Recent Labs  Lab 01/23/21 1501 01/24/21 0134  AMMONIA 25 65*    ABG    Component Value Date/Time   PHART 7.426 (H) 01/26/2009 1040   PCO2ART 36.9 01/26/2009 1040   PO2ART 112.0 (H) 01/26/2009 1040   HCO3 24.8 01/24/2021 0612   TCO2 31 11/11/2020 1017   ACIDBASEDEF 7.7 (H) 01/24/2021 0134  O2SAT 97.3 01/24/2021 0612     Coagulation Profile: Recent Labs  Lab 01/24/21 1054  INR 1.2    Cardiac Enzymes: No results for input(s): CKTOTAL, CKMB, CKMBINDEX, TROPONINI in the last 168 hours.  HbA1C: No results found for: HGBA1C  CBG: Recent Labs  Lab 01/23/21 1019 01/24/21 0124 01/24/21 1131  GLUCAP 73 127* 126*   Critical care time:     Total critical care time: 42 minutes  Performed by: [place] care time was exclusive of separately billable procedures and treating other patients.   Critical care was necessary to treat or prevent imminent or life-threatening deterioration.   Critical care was time spent personally by me on the following activities: development of treatment plan with patient and/or surrogate as well as nursing, discussions with consultants, evaluation of patient's response to treatment, examination of patient, obtaining history from patient or surrogate, ordering and performing treatments and  interventions, ordering and review of laboratory studies, ordering and review of radiographic studies, pulse oximetry and re-evaluation of patient's condition.   Jacky Kindle MD [facility] See Amion for pager If no response to pager, please call [phone] until 7pm After 7pm, Please call E-link [phone]

## 2021-01-26 NOTE — Progress Notes (Addendum)
Pharmacy Antibiotic Note  [name] is a 70 y.o. female admitted on 01/23/2021 with MRSA infection of right AV fistula on vancomycin PTA with plan to continue to 12/14 (started 10/28, for 6 weeks).  Patient gets dialysis TTSa but has received inconsistent outpatient antibiotic therapy due to missing hemodialysis or ending session early.  Pharmacy has been consulted for vancomycin dosing.  Last HD supposed to be 11/26 but delayed to 11/27 am. Vancomycin 500mg  given post-HD.   Plan: Vancomycin 500mg  Post-HD on TTSa F/u HD schedule, adjust doses as needed F/u Weekly vancomycin level on Mondays- next 11/28 End date 12/14   Weight: 43.4 kg (95 lb 10.9 oz)  Temp (24hrs), Avg:98.1 F (36.7 C), Min:97.6 F (36.4 C), Max:98.4 F (36.9 C)  Recent Labs  Lab 01/23/21 1016 01/24/21 0134 01/24/21 1331 01/24/21 1440 01/25/21 0209 01/26/21 0331 01/26/21 0332  WBC 3.2* 4.9 4.7  --  3.8* 5.1  --   CREATININE 6.80* 8.31*  --  9.38* 10.46*  --  8.38*    Estimated Creatinine Clearance: 4.5 mL/min (A) (by C-G formula based on SCr of 8.38 mg/dL (H)).    Allergies  Allergen Reactions   Lactose Intolerance (Gi) Other (See Comments)    On MAR   Latex Rash and Itching   Penicillins Rash and Swelling    Has patient had a PCN reaction causing immediate rash, facial/tongue/throat swelling, SOB or lightheadedness with hypotension: No Has patient had a PCN reaction causing severe rash involving mucus membranes or skin necrosis: No Has patient had a PCN reaction that required hospitalization No Has patient had a PCN reaction occurring within the last 10 years: No If all of the above answers are "NO", then may proceed with Cephalosporin use.      Antimicrobials this admission: Vancomycin 10/28  >> 12/14    Microbiology results: 11/26 BCx: ngtd 11/26 MRSA PCR: neg  10/28 Arm abscess + MRSA   Thank you for allowing pharmacy to be a part of this patient's care.    Benetta Spar, PharmD,  BCPS, BCCP Clinical Pharmacist  Please check AMION for all [place] phone numbers After 10:00 PM, call [place] [phone]

## 2021-01-26 NOTE — Progress Notes (Signed)
ANTICOAGULATION CONSULT NOTE - Initial Consult  Pharmacy Consult for heparin Indication:  Aflutter  Allergies  Allergen Reactions   Lactose Intolerance (Gi) Other (See Comments)    On MAR   Latex Rash and Itching   Penicillins Rash and Swelling    Has patient had a PCN reaction causing immediate rash, facial/tongue/throat swelling, SOB or lightheadedness with hypotension: No Has patient had a PCN reaction causing severe rash involving mucus membranes or skin necrosis: No Has patient had a PCN reaction that required hospitalization No Has patient had a PCN reaction occurring within the last 10 years: No If all of the above answers are "NO", then may proceed with Cephalosporin use.      Patient Measurements: Weight: 43.4 kg (95 lb 10.9 oz)  Vital Signs: Temp: 97.6 F (36.4 C) (11/27 0020) Temp Source: Axillary (11/27 0020) BP: 137/87 (11/27 0700) Pulse Rate: 98 (11/27 0700)  Labs: Recent Labs    01/23/21 1016 01/23/21 1208 01/24/21 0134 01/24/21 1054 01/24/21 1331 01/24/21 1440 01/25/21 0209 01/26/21 0331 01/26/21 0332  HGB 10.1*  --    < >  --  10.2*  --  8.7* 9.7*  --   HCT 34.4*  --    < >  --  34.0*  --  28.8* 31.9*  --   PLT 147*  --    < >  --  143*  --  127* 139*  --   APTT  --   --   --   --   --   --   --  108*  --   LABPROT  --   --   --  15.3*  --   --   --   --   --   INR  --   --   --  1.2  --   --   --   --   --   HEPARINUNFRC  --   --   --   --   --   --   --   --  0.60  CREATININE 6.80*  --    < >  --   --  9.38* 10.46*  --  8.38*  TROPONINIHS 35* 39*  --   --   --   --   --   --   --    < > = values in this interval not displayed.    Estimated Creatinine Clearance: 4.5 mL/min (A) (by C-G formula based on SCr of 8.38 mg/dL (H)).   Medical History: Past Medical History:  Diagnosis Date   Anemia    Anxiety    Atrial flutter ([place]) 02/22/2015   C. difficile colitis 10/10/2011   Chronic diarrhea    Chronic pain    Depression    ESRD on  hemodialysis [facility])    Dialysis [name]   GERD (gastroesophageal reflux disease)    HIV (human immunodeficiency virus infection) ([place])    Hypertension    Insomnia    Intracranial hemorrhage (HCC)    Muscle weakness (generalized)    Pulmonary HTN (HCC)    Tachycardia    TIA (transient ischemic attack)    [facility] do not have this dx   Traumatic hematoma of right upper arm 02/22/2015    Medications:  Medications Prior to Admission  Medication Sig Dispense Refill Last Dose   Amino Acids-Protein Hydrolys (PRO-STAT 64 PO) Take by mouth. Give 25ml by mouth in the morning for wound healing  01/23/2021   apixaban (ELIQUIS) 2.5 MG TABS tablet Take 1 tablet (2.5 mg total) by mouth 2 (two) times daily. 60 tablet 0 01/23/2021 at 0800   baclofen (LIORESAL) 10 MG tablet Take 10 mg by mouth daily as needed for muscle spasms.   Past Month   clopidogrel (PLAVIX) 75 MG tablet Take 1 tablet (75 mg total) by mouth daily with breakfast. 30 tablet  Past Month   collagenase (SANTYL) ointment Apply 1 application topically daily. Apply to sacrum topically every day shift for wound healing      cyanocobalamin (,VITAMIN B-12,) 1000 MCG/ML injection Inject 1,000 mcg into the muscle every 30 (thirty) days. On the 28th of every month   Past Month   dolutegravir (TIVICAY) 50 MG tablet Take 50 mg by mouth daily at 4 PM.   Past Week   Emollient (MINERIN) LOTN Apply 1 application topically every 12 (twelve) hours as needed (dry skin). Apply to feet and ankles      fentaNYL (DURAGESIC) 25 MCG/HR Place 1 patch onto the skin every 3 (three) days. 3 patch 0 Past Week   lamivudine (EPIVIR) 100 MG tablet Take 50 mg by mouth daily. (0800)   01/23/2021   Lidocaine HCl 4 % CREA Apply 1 application topically every 12 (twelve) hours as needed (pain).      lidocaine-prilocaine (EMLA) cream Apply 1 application topically See admin instructions. (0900) Apply to fistula site one hour before dialysis on Tuesday,  Thursday, and Saturday.   Past Month   megestrol (MEGACE) 400 MG/10ML suspension Take 400 mg by mouth daily.   01/23/2021   melatonin 3 MG TABS tablet Take 3 mg by mouth at bedtime. (2000)   Past Week   methadone (DOLOPHINE) 5 MG tablet Take 0.5 tablets (2.5 mg total) by mouth daily at 8 pm. (2000) 3 tablet 0 Past Week   metoprolol tartrate (LOPRESSOR) 25 MG tablet Take 1 tablet (25 mg total) by mouth 2 (two) times daily. 60 tablet 0 01/23/2021 at 0800   mirtazapine (REMERON) 15 MG tablet Take 15 mg by mouth at bedtime.   Past Month   nitroGLYCERIN (NITROSTAT) 0.4 MG SL tablet Place 0.4 mg under the tongue every 5 (five) minutes x 3 doses as needed for chest pain.    unk at unk   ondansetron (ZOFRAN ODT) 8 MG disintegrating tablet Take 1 tablet (8 mg total) by mouth every 8 (eight) hours as needed for nausea or vomiting. 30 tablet 3 Past Month   Oxycodone HCl 10 MG TABS Take 1 tablet (10 mg total) by mouth every 12 (twelve) hours as needed (severe pain). 4 tablet 0 Past Week   pantoprazole (PROTONIX) 40 MG tablet Take 1 tablet (40 mg total) by mouth daily before breakfast. 30 tablet 3 01/23/2021   promethazine (PHENERGAN) 12.5 MG tablet Take 12.5 mg by mouth See admin instructions. Take 1 tablet (12.5 mg) by mouth in the morning every Tuesday, Thursday and Saturday for nausea/vomiting and every 6 hours as needed for n/v   01/23/2021   senna (SENOKOT) 8.6 MG tablet Take 2 tablets by mouth every evening. (2100)   Past Week   vancomycin (VANCOCIN) 500-5 MG/100ML-% IVPB Inject 100 mLs (500 mg total) into the vein Every Tuesday,Thursday,and Saturday with dialysis. 100 mL 0 01/23/2021   venlafaxine XR (EFFEXOR-XR) 150 MG 24 hr capsule Take 150 mg by mouth daily with breakfast. (0900)   01/23/2021   vitamin C (ASCORBIC ACID) 500 MG tablet Take 500 mg by mouth 2 (  two) times daily.   01/23/2021   zidovudine (RETROVIR) 100 MG capsule Take 100 mg by mouth 3 (three) times daily. (0900, 1300, & 2100)   01/23/2021    Zinc Sulfate (ZINC-220 PO) Take 1 tablet by mouth daily.   01/23/2021   Multiple Vitamin (MULTIVITAMIN WITH MINERALS) TABS tablet Take 1 tablet by mouth at bedtime. (2000)      Scheduled:   calcitRIOL  1.25 mcg Oral Q T,Th,Sa-HD   Chlorhexidine Gluconate Cloth  6 each Topical Daily   Chlorhexidine Gluconate Cloth  6 each Topical Q0600   Chlorhexidine Gluconate Cloth  6 each Topical Q0600   cinacalcet  90 mg Oral Q T,Th,Sa-HD   clopidogrel  75 mg Oral Q breakfast   collagenase  1 application Topical Daily   [START ON 01/27/2021] cyanocobalamin  1,000 mcg Intramuscular Q30 days   dolutegravir  50 mg Oral q1600   lamiVUDine  50 mg Oral Daily   lidocaine-prilocaine  1 application Topical Q T,Th,Sa-HD   pantoprazole (PROTONIX) IV  40 mg Intravenous QHS   senna  2 tablet Oral QPM   sodium chloride flush  3 mL Intravenous Q12H   thiamine injection  100 mg Intravenous Daily   vancomycin  500 mg Intravenous Q T,Th,Sa-HD   zidovudine  100 mg Oral TID   Infusions:   sodium chloride Stopped (01/25/21 1641)   sodium chloride     sodium chloride     sodium chloride     sodium chloride     amiodarone 60 mg/hr (01/26/21 0700)   Followed by   amiodarone     clevidipine 16 mg/hr (01/26/21 0700)   heparin 900 Units/hr (01/26/21 0700)   lacosamide (VIMPAT) IV Stopped (01/25/21 2222)   levETIRAcetam Stopped (01/25/21 2247)   levETIRAcetam      Assessment: 70yo W with hypertensive emergency and PRES on apixaban PTA for aflutter, now held. Last dose of Eliquis was taken 11/24 Pharmacy consulted for heparin   Heparin held for about 7 hours 11/26 to perform LP. Restarted around 5pm. Heparin level 0.6 and aPTT 108 are correlating, both are therapeutic on 900 units/hr. H/H, plt stable. Will monitor heparin levels from now. Decrease rate slightly to keep in goal.    Goal of Therapy:  Heparin level 0.3-0.7 units/ml aPTT 66-102 seconds Monitor platelets by anticoagulation protocol: Yes   Plan:   Decrease heparin infusion to 850 units/hr  Monitor daily aPTT, HL, CBC/plt Monitor for signs/symptoms of bleeding  F/u restart apixaban eventually   Benetta Spar, PharmD, BCPS, [facility] Clinical Pharmacist  Please check AMION for all [place] phone numbers After 10:00 PM, call [place]

## 2021-01-26 NOTE — Procedures (Addendum)
Patient Name: [name]  MRN: [id number]  Epilepsy Attending: Lora Havens  Referring Physician/Provider: Dr Donnetta Simpers Duration: 01/25/2021 1021 to 01/26/2021 0200, 01/26/2021 0821- 1700   Patient history: 70 year old female who presented with altered mental status, fever and seizure.  EEG to evaluate for seizure.   Level of alertness:  lethargic    AEDs during EEG study: LEV, LCM   Technical aspects: This EEG study was done with scalp electrodes positioned according to the 10-20 International system of electrode placement. Electrical activity was acquired at a sampling rate of 500Hz  and reviewed with a high frequency filter of 70Hz  and a low frequency filter of 1Hz . EEG data were recorded continuously and digitally stored.    Description: No posterior dominant rhythm was seen.  EEG showed continuous generalized polymorphic sharply contoured 3 to 6 Hz theta-delta slowing. Generalized periodic discharges with triphasic morphology were noted with fluctuation frequency between 1.5-2 Hz were also noted. At around 1400, frequency of generalized periodic discharges worsened to 2.5hz  and appeared more rhythmic which lasted for about an hour and improved after IV vimpat was administered. 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  Hyperventilation and photic stimulation were not performed.   Of note, study was disconnected between 0200 to 0821 on 01/26/2021.     ABNORMALITY -Non-convulsive status epilepticus, generalized  - Periodic discharges with triphasic morphology, generalized ( GPDs) - Continuous slow, generalized   IMPRESSION: This study showed non-convulsive status epilepticus with generalized onset at around 1400 on 01/25/2021 which lasted for about an hour and resolved after IV vimpat was administered.  Additionally EEG showed generalized periodic discharges with triphasic morphology which is on the ictal-interictal continuum and low to  intermediate potential for seizure recurrence. This eeg pattern can also be seen due to toxic-metabolic causes like uremia, hyperammonemia.   EEG is also suggestive of moderate diffuse encephalopathy, nonspecific etiology.    [name]

## 2021-01-26 NOTE — Progress Notes (Signed)
Patient ID: [name], female   DOB: 16-Apr-1950, 70 y.o.   MRN: [id number] [facility] Progress Note   Assessment/ Plan:   1.  Acute encephalopathy: Based on neurology evaluation appears to be consistent with PRES along with imaging showing no evidence of acute CVA. 2. ESRD: She is on an outpatient TTS schedule and underwent hemodialysis overnight after potassium noted to be elevated.  No acute indication for dialysis today and should get her next treatment on 11/29. 3. Anemia: Without overt blood loss, will restart ESA after blood pressures satisfactorily controlled. 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  Hyperphosphatemia noted-not on binder, monitor on renal diet at this time. 5.  Right radiocephalic fistula infection: Status post ligation and debridement on 10/28 with inconsistent outpatient antibiotic therapy due to missing hemodialysis or signing off early.  Restarted back on antibiotic therapy with vancomycin. 6. Hypertension: Blood pressures under decent control at this time, will continue to follow closely status post hemodialysis/UF.  Subjective:   Underwent hemodialysis earlier this morning/overnight.  Per nursing report, improving mental status.   Objective:   BP 137/87   Pulse 98   Temp 98.4 F (36.9 C) (Oral)   Resp 16   Wt 43.4 kg   LMP  (LMP Unknown)   SpO2 95%   BMI 17.22 kg/m   Physical Exam: Gen: Appears fatigued resting in bed, awakens to conversation with minimal verbal responses.  On continuous EEG monitoring CVS: Pulse regular rhythm, normal rate, S1 and S2 normal Resp: Poor inspiratory effort with decreased breath sounds over bases, no distinct rales or rhonchi.  Left IJ TDC Abd: Soft, flat, nontender, bowel sounds normal Ext: Right forearm wrapped in clean gauze dressing.  Labs: BMET Recent Labs  Lab 01/23/21 1016 01/24/21 0134 01/24/21 1440 01/25/21 0209 01/26/21 0332  NA 137 137 139 140 139  K 4.3  4.4 6.0* 5.8* 4.2  CL 99 98 100 103 103  CO2 24 17* 24 24 21*  GLUCOSE 79 132* 125* 96 82  BUN 29* 35* 43* 49* 36*  CREATININE 6.80* 8.31* 9.38* 10.46* 8.38*  CALCIUM 8.5* 8.7* 8.5* 8.4* 8.2*  PHOS  --   --   --  8.4* 5.7*   CBC Recent Labs  Lab 01/23/21 1016 01/24/21 0134 01/24/21 1331 01/25/21 0209 01/26/21 0331  WBC 3.2* 4.9 4.7 3.8* 5.1  NEUTROABS 2.4  --  3.8  --   --   HGB 10.1* 11.5* 10.2* 8.7* 9.7*  HCT 34.4* 40.1 34.0* 28.8* 31.9*  MCV 104.2* 107.8* 104.3* 102.1* 100.9*  PLT 147* 131* 143* 127* 139*      Medications:     calcitRIOL  1.25 mcg Oral Q T,Th,Sa-HD   Chlorhexidine Gluconate Cloth  6 each Topical Daily   Chlorhexidine Gluconate Cloth  6 each Topical Q0600   Chlorhexidine Gluconate Cloth  6 each Topical Q0600   cinacalcet  90 mg Oral Q T,Th,Sa-HD   clopidogrel  75 mg Oral Q breakfast   collagenase  1 application Topical Daily   [START ON 01/27/2021] cyanocobalamin  1,000 mcg Intramuscular Q30 days   dolutegravir  50 mg Oral q1600   lamiVUDine  50 mg Oral Daily   lidocaine-prilocaine  1 application Topical Q T,Th,Sa-HD   pantoprazole (PROTONIX) IV  40 mg Intravenous QHS   senna  2 tablet Oral QPM   sodium chloride flush  3 mL Intravenous Q12H   thiamine injection  100 mg Intravenous Daily   vancomycin  500 mg Intravenous Q T,Th,Sa-HD   zidovudine  100 mg Oral TID   Elmarie Shiley, MD 01/26/2021, 9:05 AM

## 2021-01-26 NOTE — Final Progress Note (Signed)
   Palliative Medicine Inpatient Follow Up Note   HPI: [name] is a 70 year old female with PMH significant for ESRD on HD TTS, HIV, atrial flutter on Eliquis, anemia of chronic disease, chronic pain,GERD,  and recent infection involving right AV fistula status post washout and currently on IV vancomycin with wound vac. She was transferred to [facility] from [facility] ER where she was admitted after syncope while on hemodialysis with a recent development of intermittent confusion. She had a  seizure episode and was MRI raised possibility of PRES. Other concerns have been hypertension, elevated TSH and elevated T4 with no known hyperthyroidism history.   Received hemodialysis last night. She went into afib with RVR, started on amiodarone with heart rate control. LP ruled out acute meningitis/encephalitis. 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 Mental status is improving.   Today's Discussion, assessment for symptom management: Chart reviewed. I spoke to nurse [name]. [name] has been opening eyes and nodding in bed. She seems comfortable to me at assessment. She is more alert but nonverbal. Respirations are not labored.  Daughter [name] is out of town on vacation through 11/30.  Objective Assessment: Vital Signs Vitals:   01/26/21 1030 01/26/21 1200  BP: 131/83   Pulse: 87   Resp: 20   Temp:  98.9 F (37.2 C)  SpO2: 98%     Intake/Output Summary (Last 24 hours) at 01/26/2021 1332 Last data filed at 01/26/2021 0700 Gross per 24 hour  Intake 1700.04 ml  Output 1485 ml  Net 215.04 ml   Last Weight  Most recent update: 01/23/2021  6:40 PM    Weight  43.4 kg (95 lb 10.9 oz)             SUMMARY OF RECOMMENDATIONS    Code Status/Advance Care Planning: DNAR/DNI Limited additional interventions: use medical treatment, IV fluids and cardiac monitoring as indicated May use less invasive airway support such as BiPAP  or CPAP. Also provide comfort measures Transfer to hospital if indicated. No feeding tube   Symptom Management:  Pain- acetaminophen prn Nausea- ondansetron prn   Palliative Prophylaxis:  GI prophylaxis- pantoprazole Constipation- senna prn   Additional Recommendations (Limitations, Scope, Preferences): Full scope of care other than DNR     Psycho-social/Spiritual:  Desire for further Chaplaincy support: yes, Christian Additional Recommendations: Follow for symptom management and further discussion once  Daughter is back in town from vacation on 11/30   Discharge Planning: TBD    Time In: 9:35 Time Out:9:50 Time Spent: 15 minutes Greater than 50% of the time was spent in counseling and coordination of care ______________________________________________________________________________________ Lindell Spar, NP [place] Team Team Cell Phone: [phone] Please utilize secure chat with additional questions, if there is no response within 30 minutes please call the above phone number  Palliative Medicine Team providers are available by phone from 7am to 7pm daily and can be reached through the team cell phone.  Should this patient require assistance outside of these hours, please call the patient's attending physician.

## 2021-01-27 ENCOUNTER — Inpatient Hospital Stay (HOSPITAL_COMMUNITY): Payer: Medicare Other

## 2021-01-27 DIAGNOSIS — L089 Local infection of the skin and subcutaneous tissue, unspecified: Secondary | ICD-10-CM

## 2021-01-27 DIAGNOSIS — R410 Disorientation, unspecified: Secondary | ICD-10-CM

## 2021-01-27 DIAGNOSIS — I482 Chronic atrial fibrillation, unspecified: Secondary | ICD-10-CM

## 2021-01-27 DIAGNOSIS — G9340 Encephalopathy, unspecified: Secondary | ICD-10-CM | POA: Diagnosis not present

## 2021-01-27 DIAGNOSIS — I6783 Posterior reversible encephalopathy syndrome: Secondary | ICD-10-CM | POA: Diagnosis not present

## 2021-01-27 DIAGNOSIS — N186 End stage renal disease: Secondary | ICD-10-CM | POA: Diagnosis not present

## 2021-01-27 DIAGNOSIS — R55 Syncope and collapse: Secondary | ICD-10-CM

## 2021-01-27 DIAGNOSIS — I10 Essential (primary) hypertension: Secondary | ICD-10-CM

## 2021-01-27 DIAGNOSIS — L988 Other specified disorders of the skin and subcutaneous tissue: Secondary | ICD-10-CM

## 2021-01-27 LAB — HIV-1/2 AB - DIFFERENTIATION
HIV 1 Ab: REACTIVE
HIV 2 Ab: NONREACTIVE

## 2021-01-27 LAB — CBC
HCT: 34.3 % — ABNORMAL LOW (ref 36.0–46.0)
Hemoglobin: 10.9 g/dL — ABNORMAL LOW (ref 12.0–15.0)
MCH: 31 pg (ref 26.0–34.0)
MCHC: 31.8 g/dL (ref 30.0–36.0)
MCV: 97.4 fL (ref 80.0–100.0)
Platelets: 165 10*3/uL (ref 150–400)
RBC: 3.52 MIL/uL — ABNORMAL LOW (ref 3.87–5.11)
RDW: 18.2 % — ABNORMAL HIGH (ref 11.5–15.5)
WBC: 5.1 10*3/uL (ref 4.0–10.5)
nRBC: 0 % (ref 0.0–0.2)

## 2021-01-27 LAB — RENAL FUNCTION PANEL
Albumin: 2.7 g/dL — ABNORMAL LOW (ref 3.5–5.0)
Anion gap: 14 (ref 5–15)
BUN: 16 mg/dL (ref 8–23)
CO2: 21 mmol/L — ABNORMAL LOW (ref 22–32)
Calcium: 8.7 mg/dL — ABNORMAL LOW (ref 8.9–10.3)
Chloride: 97 mmol/L — ABNORMAL LOW (ref 98–111)
Creatinine, Ser: 5.49 mg/dL — ABNORMAL HIGH (ref 0.44–1.00)
GFR, Estimated: 8 mL/min — ABNORMAL LOW (ref >60)
Glucose, Bld: 77 mg/dL (ref 70–99)
Phosphorus: 6.3 mg/dL — ABNORMAL HIGH (ref 2.5–4.6)
Potassium: 4.2 mmol/L (ref 3.5–5.1)
Sodium: 132 mmol/L — ABNORMAL LOW (ref 135–145)

## 2021-01-27 LAB — HEPATITIS B SURFACE ANTIBODY, QUANTITATIVE: Hep B S AB Quant (Post): >1000 m[IU]/mL (ref >9.9)

## 2021-01-27 LAB — HEPARIN LEVEL (UNFRACTIONATED): Heparin Unfractionated: 0.37 IU/mL (ref 0.30–0.70)

## 2021-01-27 LAB — VDRL, CSF: VDRL Quant, CSF: NONREACTIVE

## 2021-01-27 LAB — VANCOMYCIN, RANDOM: Vancomycin Rm: 18

## 2021-01-27 LAB — ACTH: C206 ACTH: 14.3 pg/mL (ref 7.2–63.3)

## 2021-01-27 IMAGING — DX DG ABD PORTABLE 1V
1 series · 1 of 1 positions shown · non-contrast
Comparison: CT abdomen and pelvis [DATE].

CLINICAL DATA: Post feeding tube placement.

EXAM:
PORTABLE ABDOMEN - 1 VIEW

[abdomen]
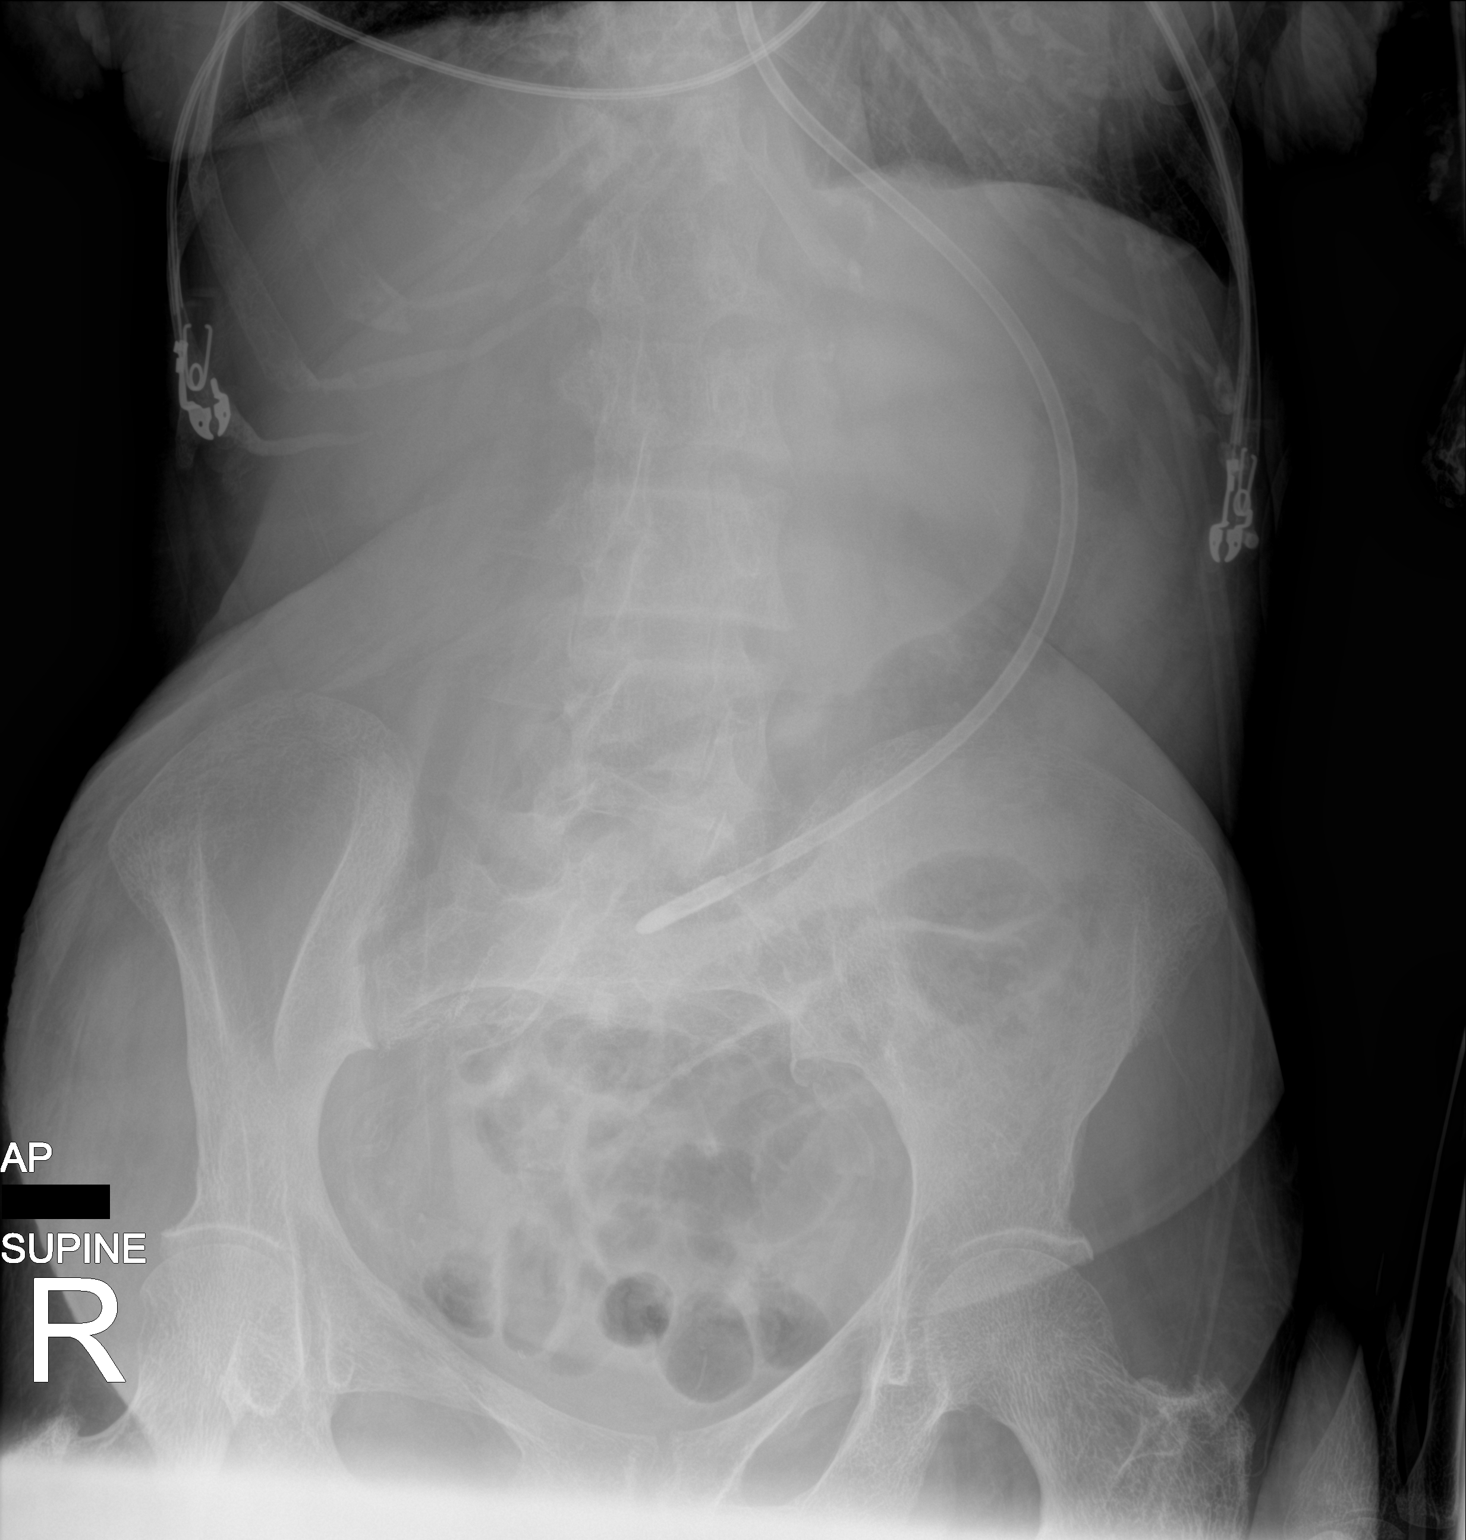

[1 of 1 positions shown; findings below may reference images not displayed]

FINDINGS: Feeding tube tip is at the level of the gastric antrum. No dilated
bowel loops visualized. No acute fractures.
IMPRESSION: Feeding tube tip at the level of the gastric antrum.

## 2021-01-27 MED ORDER — CALCITRIOL 0.25 MCG PO CAPS
1.2500 ug | ORAL_CAPSULE | ORAL | Status: DC
Start: 2021-01-28 — End: 2021-02-01
  Administered 2021-01-28 – 2021-02-01 (×2): 1.25 ug
  Filled 2021-01-27 (×2): qty 1
  Filled 2021-01-27 (×2): qty 5

## 2021-01-27 MED ORDER — ONDANSETRON 4 MG PO TBDP: 8.0000 mg | ORAL_TABLET | Freq: Three times a day (TID) | ORAL | Status: DC | PRN

## 2021-01-27 MED ORDER — LAMIVUDINE 10 MG/ML PO SOLN
50.0000 mg | Freq: Every day | ORAL | Status: DC
  Administered 2021-01-28 – 2021-01-31 (×4): 50 mg
  Filled 2021-01-27 (×5): qty 5

## 2021-01-27 MED ORDER — CLOPIDOGREL BISULFATE 75 MG PO TABS
75.0000 mg | ORAL_TABLET | Freq: Every day | ORAL | Status: DC
Start: 2021-01-28 — End: 2021-02-01
  Administered 2021-01-28 – 2021-01-31 (×4): 75 mg
  Filled 2021-01-27 (×5): qty 1

## 2021-01-27 MED ORDER — SENNA 8.6 MG PO TABS
2.0000 | ORAL_TABLET | Freq: Every evening | ORAL | Status: DC
  Administered 2021-01-27 – 2021-01-30 (×4): 17.2 mg
  Filled 2021-01-27 (×4): qty 2

## 2021-01-27 MED ORDER — CHLORHEXIDINE GLUCONATE CLOTH 2 % EX PADS
6.0000 | MEDICATED_PAD | Freq: Every day | CUTANEOUS | Status: DC
  Administered 2021-01-28 – 2021-01-31 (×4): 6 via TOPICAL

## 2021-01-27 MED ORDER — ONDANSETRON HCL 4 MG PO TABS: 4.0000 mg | ORAL_TABLET | Freq: Four times a day (QID) | ORAL | Status: DC | PRN

## 2021-01-27 MED ORDER — DOLUTEGRAVIR SODIUM 50 MG PO TABS
50.0000 mg | ORAL_TABLET | Freq: Every day | ORAL | Status: DC
Start: 2021-01-28 — End: 2021-02-01
  Administered 2021-01-28 – 2021-01-31 (×4): 50 mg
  Filled 2021-01-27 (×6): qty 1

## 2021-01-27 MED ORDER — CHLORHEXIDINE GLUCONATE 0.12 % MT SOLN
15.0000 mL | Freq: Two times a day (BID) | OROMUCOSAL | Status: DC
  Administered 2021-01-27 – 2021-02-10 (×19): 15 mL via OROMUCOSAL
  Filled 2021-01-27 (×22): qty 15

## 2021-01-27 MED ORDER — DARBEPOETIN ALFA 40 MCG/0.4ML IJ SOSY
40.0000 ug | PREFILLED_SYRINGE | INTRAMUSCULAR | Status: DC
  Administered 2021-01-28 – 2021-02-04 (×2): 40 ug via INTRAVENOUS
  Filled 2021-01-27 (×4): qty 0.4

## 2021-01-27 MED ORDER — ORAL CARE MOUTH RINSE
15.0000 mL | Freq: Two times a day (BID) | OROMUCOSAL | Status: DC
  Administered 2021-01-28 – 2021-02-10 (×19): 15 mL via OROMUCOSAL

## 2021-01-27 MED ORDER — ZIDOVUDINE 50 MG/5ML PO SYRP
100.0000 mg | ORAL_SOLUTION | Freq: Three times a day (TID) | ORAL | Status: DC
  Administered 2021-01-27 – 2021-02-01 (×14): 100 mg
  Filled 2021-01-27 (×21): qty 10

## 2021-01-27 MED ORDER — ACETAMINOPHEN 650 MG RE SUPP: 650.0000 mg | Freq: Four times a day (QID) | RECTAL | Status: DC | PRN

## 2021-01-27 MED ORDER — ONDANSETRON HCL 4 MG/2ML IJ SOLN
4.0000 mg | Freq: Four times a day (QID) | INTRAMUSCULAR | Status: DC | PRN
  Administered 2021-01-27 – 2021-01-31 (×3): 4 mg via INTRAVENOUS
  Filled 2021-01-27 (×3): qty 2

## 2021-01-27 MED ORDER — ACETAMINOPHEN 160 MG/5ML PO SOLN
650.0000 mg | Freq: Four times a day (QID) | ORAL | Status: DC | PRN
  Administered 2021-01-27 – 2021-02-01 (×4): 650 mg
  Filled 2021-01-27 (×4): qty 20.3

## 2021-01-27 NOTE — Procedures (Addendum)
Patient Name: [name]  MRN: [id number]  Epilepsy Attending: Lora Havens  Referring Physician/Provider: Dr Donnetta Simpers Duration: 01/26/2021 1700 to 01/27/2021 0200, 0958 to 02/27/2021 0100   Patient history: 70 year old female who presented with altered mental status, fever and seizure.  EEG to evaluate for seizure.   Level of alertness:  lethargic    AEDs during EEG study: LEV, LCM   Technical aspects: This EEG study was done with scalp electrodes positioned according to the 10-20 International system of electrode placement. Electrical activity was acquired at a sampling rate of 500Hz  and reviewed with a high frequency filter of 70Hz  and a low frequency filter of 1Hz . EEG data were recorded continuously and digitally stored.    Description: No posterior dominant rhythm was seen.  EEG showed continuous generalized polymorphic mixed frequencies with predominantly 3 to 6 Hz theta-delta slowing as well as 15 to 18 Hz beta activity in frontocentral region.  Intermittent generalized periodic discharges with triphasic morphology were noted with fluctuation frequency between 1.5-2 Hz were also noted. Hyperventilation and photic stimulation were not performed.     ABNORMALITY - Periodic discharges with triphasic morphology, generalized ( GPDs) - Continuous slow, generalized   IMPRESSION: This study showed generalized periodic discharges with triphasic morphology which is on the ictal-interictal continuum and low potential for seizure recurrence. This eeg pattern can also be seen due to toxic-metabolic causes like uremia, hyperammonemia. EEG is also suggestive of moderate diffuse encephalopathy, nonspecific etiology.   EEG appears to be improving compared to previous day.   Aerie Donica Barbra Sarks

## 2021-01-27 NOTE — Progress Notes (Signed)
NEUROLOGY CONSULTATION PROGRESS NOTE   Date of service: 01/26/21 Patient Name: ALETTE KATAOKA MRN:  [id number] DOB:  10-Dec-1950  Brief HPI  MACKENSEY BOLTE is a 70 y.o. female with PMH significant for ESRD on HD TTS, HIV, atrial flutter on Eliquis, anemia of chronic disease, chronic  pain, recent MRSA infection involving right AV fistula status post washout currently on vancomycin with wound VAC who presented after syncope during dialysis.  She was initially admitted to [facility]. She was initially alert and oriented however gradually her mental status worsened and she was also noted to have seizure-like activity lasting about 30 minutes.  Patient was given IV Ativan after which seizure stopped.  She was also started on Keppra.  Workup with MRI Brain w/o contrast concerning for PRES for which she was started on Clevidipine gtt. Routine EEG was notable for triphasic waves along ictal-interictal continuum. She was transferred for [facility] Neurology evaluation and for a cEEG. On arrival she was v encephalopathic and not interactive.   Interval Hx   EEG initially showed non convulsive status epilepticus, now resolved. Currently shows GPDs at 1.5-2 Hz. Vimpat increased this AM to 150mg  bid (max dose for ESRD). Patient's mental status is vastly improved, she is awake, alert, following commands, oriented to self, hospital, and year, and is at least anti-gravity in all extremities to command without focality.  EEG 11/26-11/27  This study showed non-convulsive status epilepticus with generalized onset at around 1400 on 01/25/2021 which lasted for about an hour and resolved after IV vimpat was administered.   Additionally EEG showed generalized periodic discharges with triphasic morphology which is on the ictal-interictal continuum and low to intermediate potential for seizure recurrence. This eeg pattern can also be seen due to toxic-metabolic causes like uremia, hyperammonemia.    EEG is also  suggestive of moderate diffuse encephalopathy, nonspecific etiology.   Vitals   Vitals:   01/27/21 0545 01/27/21 0600 01/27/21 0615 01/27/21 0630  BP: 135/80 (!) 142/78 (!) 155/81 (!) 144/85  Pulse: 76 80 84 81  Resp: (!) 22 (!) 21 18 (!) 21  Temp:      TempSrc:      SpO2: 96% 97% 96% 95%  Weight:         Body mass index is 17.22 kg/m.  Physical Exam   General: Laying comfortably in bed; in no acute distress.  CV: No JVD. No peripheral edema.  Pulmonary: Symmetric Chest rise. Normal respiratory effort.   Neurologic exam Mental status: awake, alert, interactive. Oriented to self, hospital, and year. Able to follow simple commands, albeit with significant processing delay. Speech: mild dysarthria, able to name and repeat CN: PERRL, blinks to threat bilat, EOMI, sensation intact, face symmetric at rest, hearing intact to voice, normal tongue protrusion Motor: Anti-gravity BUE with 2/5 grip bilat, symmetric, able to wiggle toes bilat Sensory: SILT Reflexes: 1+ throughout with mute toes bilat Coordination: FNF UTA 2/2 weakness Gait: deferred  Labs   Basic Metabolic Panel:  Lab Results  Component Value Date   NA 132 (L) 01/27/2021   K 4.2 01/27/2021   CO2 21 (L) 01/27/2021   GLUCOSE 77 01/27/2021   BUN 16 01/27/2021   CREATININE 5.49 (H) 01/27/2021   CALCIUM 8.7 (L) 01/27/2021   GFRNONAA 8 (L) 01/27/2021   GFRAA 9 (L) 10/22/2019   HbA1c: No results found for: HGBA1C LDL:  Lab Results  Component Value Date   LDLCALC 66 12/22/2019   Urine Drug Screen: No results found for: LABOPIA,  COCAINSCRNUR, LABBENZ, AMPHETMU, THCU, LABBARB  Alcohol Level No results found for: ETH No results found for: PHENYTOIN, ZONISAMIDE, LAMOTRIGINE, LEVETIRACETA No results found for: PHENYTOIN, PHENOBARB, VALPROATE, CBMZ  Imaging and Diagnostic studies   MRI Brain w/o Contrast:  Negative for acute infarct Cortical and subcortical edema in the occipital parietal lobes bilaterally,  right for slightly greater than left. This is not present on the prior MRI of 11/18/2020. Findings may be due to PRES. Correlate with hypertension history.   Impression   70 yo woman w/ PMH significant for ESRD on HD, HIV, atrial flutter on Eliquis, p/w syncope during dialysis. MRI brain c/w PRES. EEG showed NCSE that improved after keppra + vimpat (on max dose of each for ESRD). Exam improving. Febrile at outside hospital, LP not c/f infection, no indication for coverage (incl acyclovir) per ID. 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  Recommendations   - continue Keppra 1000mg  daily + extra 250mg  after dialysis tiw - Continue vimpat 150mg  bid - Keppra and vimpat currently both at max doses for ESRD - Continue LTM - BP control - Neurology will continue to follow along.  Su Monks, MD [facility] [phone]  If 7pm- 7am, please page neurology on call as listed in [place].

## 2021-01-27 NOTE — Progress Notes (Signed)
[facility] NEPHROLOGY PROGRESS NOTE  Assessment/ Plan:  Outpatient HD orders:[name] TTS,EDW 41.5 kg,[place] 2K/2.5 ca,BF 350  DF 500,Time - 180 minutes Access catheter  Medications - out of hectorol and now on calcitriol - 1.25 mcg each tx. Sensipar 90 mg three times a week; Epogen 3600 units each tx, Heparin 1000 units bolus then 500 units per hour Has been on vanc 500 mg each tx (though misses often)   # Acute encephalopathy: EEG with moderate diffuse encephalopathy of nonspecific etiology.  Based on neurology evaluation appears to be consistent with PRES along with imaging showing no evidence of acute CVA.  Per neurology.  # ESRD: She is on an outpatient TTS schedule, next treatment on 11/29.  Left IJ TDC for the access.  # Anemia: Without overt blood loss, continue ESA with HD.  # CKD-MBD: Resumed  calcitriol and Sensipar for PTH control.  Hyperphosphatemia noted-not on binder, monitor on renal diet at this time.  #  Right radiocephalic fistula infection: Status post ligation and debridement on 10/28 with inconsistent outpatient antibiotic therapy due to missing hemodialysis or signing off early.  Restarted back on antibiotic therapy with vancomycin.  # Hypertension: Blood pressures under decent control at this time, will continue to follow closely status post hemodialysis/UF.  Subjective: Seen and examined at bedside.  She is alert awake and following commands.  No new event. Objective Vital signs in last 24 hours: Vitals:   01/27/21 0845 01/27/21 0900 01/27/21 0915 01/27/21 1156  BP: 132/75 (!) 144/101 (!) 139/91   Pulse: 80 94 87   Resp: (!) 22 (!) 23 20   Temp:    98.6 F (37 C)  TempSrc:    Oral  SpO2: 95% 97% 95%   Weight:       Weight change:   Intake/Output Summary (Last 24 hours) at 01/27/2021 1221 Last data filed at 01/27/2021 0600 Gross per 24 hour  Intake 1404.52 ml  Output --  Net 1404.52 ml       Labs: Basic Metabolic  Panel: Recent Labs  Lab 01/25/21 0209 01/26/21 0332 01/27/21 0443  NA 140 139 132*  K 5.8* 4.2 4.2  CL 103 103 97*  CO2 24 21* 21*  GLUCOSE 96 82 77  BUN 49* 36* 16  CREATININE 10.46* 8.38* 5.49*  CALCIUM 8.4* 8.2* 8.7*  PHOS 8.4* 5.7* 6.3*   Liver Function Tests: Recent Labs  Lab 01/23/21 1016 01/24/21 0134 01/24/21 1440 01/25/21 0209 01/26/21 0332 01/27/21 0443  AST 17 27 20   --   --   --   ALT 10 11 11   --   --   --   ALKPHOS 87 95 84  --   --   --   BILITOT 0.9 0.9 0.5  --   --   --   PROT 7.5 8.4* 8.0  --   --   --   ALBUMIN 3.0* 3.3* 3.2* 2.8* 2.8* 2.7*   No results for input(s): LIPASE, AMYLASE in the last 168 hours. Recent Labs  Lab 01/23/21 1501 01/24/21 0134  AMMONIA 25 65*   CBC: Recent Labs  Lab 01/23/21 1016 01/24/21 0134 01/24/21 1331 01/25/21 0209 01/26/21 0331 01/27/21 0443  WBC 3.2* 4.9 4.7 3.8* 5.1 5.1  NEUTROABS 2.4  --  3.8  --   --   --   HGB 10.1* 11.5* 10.2* 8.7* 9.7* 10.9*  HCT 34.4* 40.1 34.0* 28.8* 31.9* 34.3*  MCV 104.2* 107.8* 104.3* 102.1* 100.9* 97.4  PLT  147* 131* 143* 127* 139* 165   Cardiac Enzymes: No results for input(s): CKTOTAL, CKMB, CKMBINDEX, TROPONINI in the last 168 hours. CBG: Recent Labs  Lab 01/23/21 1019 01/24/21 0124 01/24/21 1131  GLUCAP 73 127* 126*    Iron Studies: No results for input(s): IRON, TIBC, TRANSFERRIN, FERRITIN in the last 72 hours. Studies/Results: Overnight EEG with video  Result Date: 01/26/2021 Lora Havens, MD     01/27/2021  8:28 AM Patient Name: [name] MRN: [id number] Epilepsy Attending: Lora Havens Referring Physician/Provider: Dr Donnetta Simpers Duration: 01/25/2021 1021 to 01/26/2021 0200, 01/26/2021 0821- 1700  Patient history: 70 year old female who presented with altered mental status, fever and seizure.  EEG to evaluate for seizure.  Level of alertness:  lethargic  AEDs during EEG study: LEV, LCM  Technical aspects: This EEG study was done with scalp  electrodes positioned according to the 10-20 International system of electrode placement. Electrical activity was acquired at a sampling rate of 500Hz  and reviewed with a high frequency filter of 70Hz  and a low frequency filter of 1Hz . EEG data were recorded continuously and digitally stored.  Description: No posterior dominant rhythm was seen.  EEG showed continuous generalized polymorphic sharply contoured 3 to 6 Hz theta-delta slowing. Generalized periodic discharges with triphasic morphology were noted with fluctuation frequency between 1.5-2 Hz were also noted. At around 1400, frequency of generalized periodic discharges worsened to 2.5hz  and appeared more rhythmic which lasted for about an hour and improved after IV vimpat was administered. 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  Hyperventilation and photic stimulation were not performed. Of note, study was disconnected between 0200 to 0821 on 01/26/2021.   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 Additionally EEG showed generalized periodic discharges with triphasic morphology which is on the ictal-interictal continuum and low to intermediate potential for seizure recurrence. This eeg pattern can also be seen due to toxic-metabolic causes like uremia, hyperammonemia. EEG is also suggestive of moderate diffuse encephalopathy, nonspecific etiology.  Priyanka Barbra Sarks    Medications: Infusions:  sodium chloride Stopped (01/25/21 1641)   sodium chloride     sodium chloride     sodium chloride     sodium chloride     amiodarone 30 mg/hr (01/27/21 0956)   clevidipine 10 mg/hr (01/27/21 0956)   heparin 850 Units/hr  (01/27/21 0600)   lacosamide (VIMPAT) IV 150 mg (01/27/21 1004)   [START ON 01/28/2021] levETIRAcetam     levETIRAcetam 1,000 mg (01/27/21 0612)    Scheduled Medications:  calcitRIOL  1.25 mcg Oral Q T,Th,Sa-HD   Chlorhexidine Gluconate Cloth  6 each Topical Q0600   Chlorhexidine Gluconate Cloth  6 each Topical Q0600   cinacalcet  90 mg Oral Q T,Th,Sa-HD   clopidogrel  75 mg Oral Q breakfast   collagenase  1 application Topical Daily   dolutegravir  50 mg Oral q1600   lamiVUDine  50 mg Oral Daily   lidocaine-prilocaine  1 application Topical Q T,Th,Sa-HD   pantoprazole (PROTONIX) IV  40 mg Intravenous QHS   senna  2 tablet Oral QPM   sodium chloride flush  3 mL Intravenous Q12H   thiamine injection  100 mg Intravenous Daily   [START ON 01/28/2021] vancomycin  500 mg Intravenous  Q T,Th,Sa-HD   zidovudine  100 mg Oral TID    have reviewed scheduled and prn medications.  Physical Exam: General:NAD, comfortable Heart:RRR, s1s2 nl Lungs:clear b/l, no crackle Abdomen:soft, Non-tender, non-distended Extremities:No edema Dialysis Access: Left IJ TDC in place.  Tonio Seider [name] Arlin Savona 01/27/2021,12:21 PM  LOS: 4 days

## 2021-01-27 NOTE — Procedures (Signed)
Cortrak  Tube Type:  Cortrak - 43 inches Tube Location:  Left nare Initial Placement:  Stomach Secured by: Bridle Technique Used to Measure Tube Placement:  Marking at nare/corner of mouth Cortrak Secured At:  65 cm  Cortrak Tube Team Note:  Consult received to place a Cortrak feeding tube.   X-ray is required, abdominal x-ray has been ordered by the Cortrak team. Please confirm tube placement before using the Cortrak tube.   If the tube becomes dislodged please keep the tube and contact the Cortrak team at [email or web address] ([email or web address]) for replacement.  If after hours and replacement cannot be delayed, place a NG tube and confirm placement with an abdominal x-ray.    Tina Gorelick MS, RD, LDN Please refer to AMION for RD and/or RD on-call/weekend/after hours pager   

## 2021-01-27 NOTE — Progress Notes (Signed)
ANTICOAGULATION & Antibiotic CONSULT NOTE - Follow up [facility] for heparin; vancomycin  Indication:  Aflutter ; MRSA infection of AV fistula   Allergies  Allergen Reactions   Lactose Intolerance (Gi) Other (See Comments)    On MAR   Latex Rash and Itching   Penicillins Rash and Swelling    Has patient had a PCN reaction causing immediate rash, facial/tongue/throat swelling, SOB or lightheadedness with hypotension: No Has patient had a PCN reaction causing severe rash involving mucus membranes or skin necrosis: No Has patient had a PCN reaction that required hospitalization No Has patient had a PCN reaction occurring within the last 10 years: No If all of the above answers are "NO", then may proceed with Cephalosporin use.      Patient Measurements: Weight: 43.4 kg (95 lb 10.9 oz)  Vital Signs: Temp: 98.4 F (36.9 C) (11/28 0800) Temp Source: Oral (11/28 0800) BP: 139/91 (11/28 0915) Pulse Rate: 87 (11/28 0915)  Labs: Recent Labs    01/25/21 0209 01/26/21 0331 01/26/21 0332 01/27/21 0443  HGB 8.7* 9.7*  --  10.9*  HCT 28.8* 31.9*  --  34.3*  PLT 127* 139*  --  165  APTT  --  108*  --   --   HEPARINUNFRC  --   --  0.60 0.37  CREATININE 10.46*  --  8.38* 5.49*     Estimated Creatinine Clearance: 6.8 mL/min (A) (by C-G formula based on SCr of 5.49 mg/dL (H)).   Medical History: Past Medical History:  Diagnosis Date   Anemia    Anxiety    Atrial flutter ([place]) 02/22/2015   C. difficile colitis 10/10/2011   Chronic diarrhea    Chronic pain    Depression    ESRD on hemodialysis [facility])    Dialysis [name]   GERD (gastroesophageal reflux disease)    HIV (human immunodeficiency virus infection) ([place])    Hypertension    Insomnia    Intracranial hemorrhage (H[facility])    Muscle weakness (generalized)    Pulmonary HTN (HCC)    Tachycardia    TIA (transient ischemic attack)    [facility] do not have this dx   Traumatic hematoma  of right upper arm 02/22/2015    Medications:  Medications Prior to Admission  Medication Sig Dispense Refill Last Dose   Amino Acids-Protein Hydrolys (PRO-STAT 64 PO) Take by mouth. Give 67ml by mouth in the morning for wound healing   01/23/2021   apixaban (ELIQUIS) 2.5 MG TABS tablet Take 1 tablet (2.5 mg total) by mouth 2 (two) times daily. 60 tablet 0 01/23/2021 at 0800   baclofen (LIORESAL) 10 MG tablet Take 10 mg by mouth daily as needed for muscle spasms.   Past Month   clopidogrel (PLAVIX) 75 MG tablet Take 1 tablet (75 mg total) by mouth daily with breakfast. 30 tablet  Past Month   collagenase (SANTYL) ointment Apply 1 application topically daily. Apply to sacrum topically every day shift for wound healing      cyanocobalamin (,VITAMIN B-12,) 1000 MCG/ML injection Inject 1,000 mcg into the muscle every 30 (thirty) days. On the 28th of every month   Past Month   dolutegravir (TIVICAY) 50 MG tablet Take 50 mg by mouth daily at 4 PM.   Past Week   Emollient (MINERIN) LOTN Apply 1 application topically every 12 (twelve) hours as needed (dry skin). Apply to feet and ankles      fentaNYL (DURAGESIC) 25 MCG/HR  Place 1 patch onto the skin every 3 (three) days. 3 patch 0 Past Week   lamivudine (EPIVIR) 100 MG tablet Take 50 mg by mouth daily. (0800)   01/23/2021   Lidocaine HCl 4 % CREA Apply 1 application topically every 12 (twelve) hours as needed (pain).      lidocaine-prilocaine (EMLA) cream Apply 1 application topically See admin instructions. (0900) Apply to fistula site one hour before dialysis on Tuesday, Thursday, and Saturday.   Past Month   megestrol (MEGACE) 400 MG/10ML suspension Take 400 mg by mouth daily.   01/23/2021   melatonin 3 MG TABS tablet Take 3 mg by mouth at bedtime. (2000)   Past Week   methadone (DOLOPHINE) 5 MG tablet Take 0.5 tablets (2.5 mg total) by mouth daily at 8 pm. (2000) 3 tablet 0 Past Week   metoprolol tartrate (LOPRESSOR) 25 MG tablet Take 1 tablet (25  mg total) by mouth 2 (two) times daily. 60 tablet 0 01/23/2021 at 0800   mirtazapine (REMERON) 15 MG tablet Take 15 mg by mouth at bedtime.   Past Month   nitroGLYCERIN (NITROSTAT) 0.4 MG SL tablet Place 0.4 mg under the tongue every 5 (five) minutes x 3 doses as needed for chest pain.    unk at unk   ondansetron (ZOFRAN ODT) 8 MG disintegrating tablet Take 1 tablet (8 mg total) by mouth every 8 (eight) hours as needed for nausea or vomiting. 30 tablet 3 Past Month   Oxycodone HCl 10 MG TABS Take 1 tablet (10 mg total) by mouth every 12 (twelve) hours as needed (severe pain). 4 tablet 0 Past Week   pantoprazole (PROTONIX) 40 MG tablet Take 1 tablet (40 mg total) by mouth daily before breakfast. 30 tablet 3 01/23/2021   promethazine (PHENERGAN) 12.5 MG tablet Take 12.5 mg by mouth See admin instructions. Take 1 tablet (12.5 mg) by mouth in the morning every Tuesday, Thursday and Saturday for nausea/vomiting and every 6 hours as needed for n/v   01/23/2021   senna (SENOKOT) 8.6 MG tablet Take 2 tablets by mouth every evening. (2100)   Past Week   vancomycin (VANCOCIN) 500-5 MG/100ML-% IVPB Inject 100 mLs (500 mg total) into the vein Every Tuesday,Thursday,and Saturday with dialysis. 100 mL 0 01/23/2021   venlafaxine XR (EFFEXOR-XR) 150 MG 24 hr capsule Take 150 mg by mouth daily with breakfast. (0900)   01/23/2021   vitamin C (ASCORBIC ACID) 500 MG tablet Take 500 mg by mouth 2 (two) times daily.   01/23/2021   zidovudine (RETROVIR) 100 MG capsule Take 100 mg by mouth 3 (three) times daily. (0900, 1300, & 2100)   01/23/2021   Zinc Sulfate (ZINC-220 PO) Take 1 tablet by mouth daily.   01/23/2021   Multiple Vitamin (MULTIVITAMIN WITH MINERALS) TABS tablet Take 1 tablet by mouth at bedtime. (2000)      Scheduled:   calcitRIOL  1.25 mcg Oral Q T,Th,Sa-HD   Chlorhexidine Gluconate Cloth  6 each Topical Q0600   Chlorhexidine Gluconate Cloth  6 each Topical Q0600   cinacalcet  90 mg Oral Q T,Th,Sa-HD    clopidogrel  75 mg Oral Q breakfast   collagenase  1 application Topical Daily   dolutegravir  50 mg Oral q1600   lamiVUDine  50 mg Oral Daily   lidocaine-prilocaine  1 application Topical Q T,Th,Sa-HD   pantoprazole (PROTONIX) IV  40 mg Intravenous QHS   senna  2 tablet Oral QPM   sodium chloride flush  3 mL  Intravenous Q12H   thiamine injection  100 mg Intravenous Daily   [START ON 01/28/2021] vancomycin  500 mg Intravenous Q T,Th,Sa-HD   zidovudine  100 mg Oral TID   Infusions:   sodium chloride Stopped (01/25/21 1641)   sodium chloride     sodium chloride     sodium chloride     sodium chloride     amiodarone 30 mg/hr (01/27/21 0956)   clevidipine 10 mg/hr (01/27/21 0956)   heparin 850 Units/hr (01/27/21 0600)   lacosamide (VIMPAT) IV 150 mg (01/27/21 1004)   [START ON 01/28/2021] levETIRAcetam     levETIRAcetam 1,000 mg (01/27/21 0612)    Assessment: AC: 70yo W with hypertensive emergency and PRES on apixaban PTA for aflutter, now held. Last dose of Eliquis was taken 11/24 Pharmacy consulted for heparin   Currently on IV heparin at 850 units/hr. Repeat anti-Xa level this AM is therapeutic at 0.37. H/H improving. Plt normalized. No overt s/s of bleeding per RN   ID: Admitted on 01/23/2021 with MRSA infection of right AV fistula on vancomycin PTA with plan to continue to 12/14 (started 10/28, for 6 weeks).  Patient gets dialysis [facility] but has received inconsistent outpatient antibiotic therapy due to missing hemodialysis or ending session early.  Pharmacy has been consulted for vancomycin dosing.  A vancomycin random this AM is therapeutic at 18 on vancomycin 500 mg with [facility] HD   Antimicrobials this admission: Vancomycin 10/28  >> 12/14     Microbiology results: 11/26 BCx: ngtd 11/26 MRSA PCR: neg  10/28 Arm abscess + MRSA   Goal of Therapy:  Heparin level 0.3-0.7 units/ml aPTT 66-102 seconds Monitor platelets by anticoagulation protocol: Yes   Plan:  Continue  heparin infusion at 850 units/hr  Monitor daily aPTT, HL, CBC/plt Monitor for signs/symptoms of bleeding  F/u restart apixaban eventually  Continue Vancomycin 500mg  Post-HD on TTSa F/u HD schedule, adjust doses as needed F/u Weekly vancomycin level on Mondays- next 12/5 End date 12/14    Albertina Parr, PharmD., BCPS, BCCCP Clinical Pharmacist Please refer to [facility] for unit-specific pharmacist

## 2021-01-27 NOTE — Progress Notes (Signed)
NAME:  [name], MRN:  [id number], DOB:  1950-09-20, LOS: 4 ADMISSION DATE:  01/23/2021, CONSULTATION DATE:  11/25 REFERRING MD:  Manuella Ghazi , CHIEF COMPLAINT:  Syncope   History of Present Illness:  70 year old woman who presented to [facility] ED 11/24 with syncope. 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  While at HD 11/24, patient experienced syncopal event while on HD (s/p removal of 500cc fluid in HD) as well as intermittent confusion. CT Head negative for acute process.  Patient was subsequently admitted to the hospitalist service. On 11/25, patient became hypertensive, had a seizure and Neuro was consulted. MRI concerning for PRES. The patient was started on Cardene gtt for BP control. Transferred to [facility] for LTM EEG.  PCCM was consulted for assistance with management.  Pertinent Medical History:  ESRD on HD (TTS), a flutter, chronic pain, depression, GERD, HIV, HTN, infection of fistula on vanc treatment  Significant Hospital Events: Including procedures, antibiotic start and stop dates in addition to other pertinent events   11/24 Presented with syncope 11/25 Seizure, CTH neg, MRI concern for PRES, request for transfer to [facility]. 11/26 Arrival to [facility] 11/28 Improved mental status, LTM EEG with GPDs/triphasic morphology, low potential for seizure recurrence - better compared to prior. Remains on Cleviprex. Failed bedside eval with speech, will need FEES.  Interim History / Subjective:  Alert, interactive and pleasant this morning No significant events overnight LTM EEG improved from prior SBPs at goal 140s on Cleviprex Failed bedside SLP eval, will need FEES Consider Cortrak placement today, 11/28  Objective:  Blood pressure (!) 144/85, pulse 81, temperature 98.4 F (36.9 C), temperature source Oral, resp. rate (!) 21, weight 43.4 kg, SpO2 95 %.         Intake/Output Summary (Last 24 hours) at 01/27/2021 0828 Last data filed at 01/27/2021 0600 Gross per 24 hour  Intake 1404.52 ml  Output --  Net 1404.52 ml    Filed Weights   01/23/21 1830  Weight: 43.4 kg   Physical Examination: General: Chronically ill-appearing middle-aged woman in NAD. HEENT: [place]/AT, anicteric sclera, PERRL, dry mucous membranes. Edentulous. Neuro: Awake, oriented x 4. Responds to verbal stimuli. Following commands consistently. Moves all 4 extremities spontaneously. CV: Irregularly irregular rhythm, no m/g/r. PULM: Breathing even and unlabored on RA. Lung fields diminished at bilateral bases. GI: Soft, nontender, nondistended. Normoactive bowel sounds. Extremities: No LE edema noted. RUE AVF with prior I&D/skin grafting, yellow slough, Kerlix in place. Skin: Warm/dry, no rashes.  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 Findings consistent with PRES. LP completed 11/26, acute meningitis/encephalitis ruled out. EEG confirmed nonconvulsive status epilepticus, which improved after Vimpat. - Neurology/Epilepsy following, appreciate assistance - AEDs per Neuro, continue Keppra/Vimpat - LTM EEG until discontinued - SBP goal < 140 - Continue Cleviprex titrated to SBP goal - Without PO/VT access at present, increase oral meds as able  Paroxysmal atrial fibrillation with rapid ventricular response Patient went into A. fib with RVR 11/26 with heart rate 130s. On Eliquis at home. - S/p amiodarone bolus followed by infusion - Rate-controlled - Heparin gtt at present for [facility], transition back to Eliquis before d/c  MRSA infection of right AV fistula Sp I&D, skin grafting. Was receiving treatment with vanc  with outpatient dialysis. -  Continue Vanc with HD -  Pharmacy following for vanc levels/dosing - WOC consult appreciated  ESRD on HD (TTS) - Nephrology following, appreciate assistance - HD via [facility] per Nephro, next 11/29 - Trend BMP - Replete electrolytes as indicated - Monitor I&Os - Avoid nephrotoxic agents as able  HIV CD4 252 on 11/20/20 with undetected viral load. - Continue ART (zidovudine, lamivudine, dolutegravir)  Anemia of chronic disease, macrocytic Thrombocytopenia H&H stable, no signs of active bleeding. B12 levels normal. - Trend H&H - Transfuse for Hgb < 7.0  Severe protein calorie malnutrition Hypoalbuminemia - Continue dietary supplements as able - Appreciate SLP evaluation/assistance - Given failed bedside swallow 11/28, will need to consider alternate forms of med administration; would recommend temporary Cortrak (discuss with patient/daughter) - FEES indicated  GERD - PPI  GOC Palliative care consulted previous admission. 01/06/21 PC discussion with patient states DNR. Limited Additional Interventions: Use medical treatment, IV fluids and cardiac monitoring as indicated, DO NOT USE intubation or mechanical ventilation. May consider use of less invasive airway support such as BiPAP or CPAP. Also provide comfort measures. Transfer to the hospital if indicated. No feeding tube. - [employer] GOC/patient wishes today, 11/28 in context of temporary feeding tube  Best Practice (right click and "Reselect all SmartList Selections" daily)   Diet/type: NPO w/ oral meds, SLP eval recommending NPO, FEES needed DVT prophylaxis: SCD GI prophylaxis: PPI Lines: Dialysis Catheter Foley:  N/A Code Status:  DNR/DNI - See PC note on 11/7 Last date of multidisciplinary goals of care discussion [11/26, patient's daughter was updated over the phone]  Critical care time: 35 minutes   Lestine Mount, PA-C [facility] 01/27/21 8:33 AM  Please see [email or web address] for pager details.  From 7A-7P if no  response, please call [phone] After hours, please call ELink [phone]

## 2021-01-27 NOTE — Progress Notes (Signed)
EEG maintenance performed. No skin breakdown noted.  °

## 2021-01-27 NOTE — Progress Notes (Signed)
Subjective: No significant overnight events.   Objective: Current vital signs: BP 121/73   Pulse 77   Temp 98.5 F (36.9 C) (Oral)   Resp (!) 21   Wt 43.4 kg   LMP  (LMP Unknown)   SpO2 96%   BMI 17.22 kg/m  Vital signs in last 24 hours: Temp:  [98.4 F (36.9 C)-100.2 F (37.9 C)] 98.5 F (36.9 C) (11/28 1600) Pulse Rate:  [71-104] 77 (11/28 1700) Resp:  [14-30] 21 (11/28 1700) BP: (120-159)/(71-101) 121/73 (11/28 1700) SpO2:  [93 %-99 %] 96 % (11/28 1700)  Intake/Output from previous day: 11/27 0701 - 11/28 0700 In: 1404.5 [I.V.:1224.5; IV Piggyback:180] Out: -  Intake/Output this shift: Total I/O In: 621.7 [I.V.:481.7; IV Piggyback:140] Out: -  Nutritional status:  Diet Order             Diet NPO time specified Except for: Sips with Meds  Diet effective now                  HEENT: NGT in place.   Neurologic Exam: Ment: Awake and alert. Interacts with examiner. Tries to answer questions but starts coughing due to recent NGT placement. Poor cooperation with exam today.  CN: EOMI. PERRL. Face symmetric.  Motor: Moves BUE extremities, less so on the right in the context of dressing. Moves BLE but does not cooperate with strength testing.  Sensory: Reacts to FT x 4.  Cerebellar: In the context of weakness and decreased cooperation, unable to test.    Lab Results: Results for orders placed or performed during the hospital encounter of 01/23/21 (from the past 48 hour(s))  APTT     Status: Abnormal   Collection Time: 01/26/21  3:31 AM  Result Value Ref Range   aPTT 108 (H) 24 - 36 seconds    Comment:        IF BASELINE aPTT IS ELEVATED, SUGGEST PATIENT RISK ASSESSMENT BE USED TO DETERMINE APPROPRIATE ANTICOAGULANT THERAPY. Performed at [facility], [place] [address], [place], [place] [address]   CBC     Status: Abnormal   Collection Time: 01/26/21  3:31 AM  Result Value Ref Range   WBC 5.1 4.0 - 10.5 K/uL   RBC 3.16 (L) 3.87 - 5.11 MIL/uL    Hemoglobin 9.7 (L) 12.0 - 15.0 g/dL   HCT 31.9 (L) 36.0 - 46.0 %   MCV 100.9 (H) 80.0 - 100.0 fL   MCH 30.7 26.0 - 34.0 pg   MCHC 30.4 30.0 - 36.0 g/dL   RDW 18.4 (H) 11.5 - 15.5 %   Platelets 139 (L) 150 - 400 K/uL   nRBC 0.0 0.0 - 0.2 %    Comment: Performed at [facility], [place] [address] [place], [place] [address]  Hepatitis B surface antigen     Status: None   Collection Time: 01/26/21  3:31 AM  Result Value Ref Range   Hepatitis B Surface Ag NON REACTIVE NON REACTIVE    Comment: Performed at [facility] [address], [place], [place] [address]  Hepatitis B surface antibody     Status: Abnormal   Collection Time: 01/26/21  3:31 AM  Result Value Ref Range   Hep B S Ab Reactive (A) NON REACTIVE    Comment: (NOTE) Consistent with immunity, greater than 9.9 mIU/mL.  Performed at [facility], [place] [address] [place], [place] [address]   Hepatitis B surface antibody,quantitative     Status: None  Collection Time: 01/26/21  3:31 AM  Result Value Ref Range   Hepatitis B-Post >1,000.0 Immunity>9.9 mIU/mL    Comment: (NOTE)  Status of Immunity                     Anti-HBs Level  ------------------                     -------------- Inconsistent with Immunity                   0.0 - 9.9 Consistent with Immunity                          >9.9 Performed At: [facility] [place], [place] [id number] Rush Farmer MD QJ:[id number]   Renal function panel     Status: Abnormal   Collection Time: 01/26/21  3:32 AM  Result Value Ref Range   Sodium 139 135 - 145 mmol/L   Potassium 4.2 3.5 - 5.1 mmol/L   Chloride 103 98 - 111 mmol/L   CO2 21 (L) 22 - 32 mmol/L   Glucose, Bld 82 70 - 99 mg/dL    Comment: Glucose reference range applies only to samples taken after fasting for at least 8 hours.   BUN 36 (H) 8 - 23 mg/dL   Creatinine, Ser 8.38 (H) 0.44 - 1.00 mg/dL   Calcium 8.2 (L) 8.9 - 10.3 mg/dL   Phosphorus 5.7 (H) 2.5 - 4.6 mg/dL    Albumin 2.8 (L) 3.5 - 5.0 g/dL   GFR, Estimated 5 (L) >60 mL/min    Comment: (NOTE) Calculated using the CKD-EPI Creatinine Equation (2021)    Anion gap 15 5 - 15    Comment: Performed at [facility] [address], [place], [place] [address]  Heparin level (unfractionated)     Status: None   Collection Time: 01/26/21  3:32 AM  Result Value Ref Range   Heparin Unfractionated 0.60 0.30 - 0.70 IU/mL    Comment: (NOTE) The clinical reportable range upper limit is being lowered to >1.10 to align with the FDA approved guidance for the current laboratory assay.  If heparin results are below expected values, and patient dosage has  been confirmed, suggest follow up testing of antithrombin III levels. Performed at [facility], [place] [address] [place], [place] [address]   Triglycerides     Status: None   Collection Time: 01/26/21  9:50 AM  Result Value Ref Range   Triglycerides 117 <150 mg/dL    Comment: Performed at [facility] [address], [place], [place] [address]  Renal function panel     Status: Abnormal   Collection Time: 01/27/21  4:43 AM  Result Value Ref Range   Sodium 132 (L) 135 - 145 mmol/L    Comment: DELTA CHECK NOTED   Potassium 4.2 3.5 - 5.1 mmol/L   Chloride 97 (L) 98 - 111 mmol/L   CO2 21 (L) 22 - 32 mmol/L   Glucose, Bld 77 70 - 99 mg/dL    Comment: Glucose reference range applies only to samples taken after fasting for at least 8 hours.   BUN 16 8 - 23 mg/dL   Creatinine, Ser 5.49 (H) 0.44 - 1.00 mg/dL    Comment: DELTA CHECK NOTED   Calcium 8.7 (L) 8.9 - 10.3 mg/dL   Phosphorus 6.3 (H) 2.5 - 4.6 mg/dL   Albumin 2.7 (L)  3.5 - 5.0 g/dL   GFR, Estimated 8 (L) >60 mL/min    Comment: (NOTE) Calculated using the CKD-EPI Creatinine Equation (2021)    Anion gap 14 5 - 15    Comment: Performed at [facility], [place] [address], [place], [place] [address]  Heparin level (unfractionated)     Status: None   Collection Time: 01/27/21   4:43 AM  Result Value Ref Range   Heparin Unfractionated 0.37 0.30 - 0.70 IU/mL    Comment: (NOTE) The clinical reportable range upper limit is being lowered to >1.10 to align with the FDA approved guidance for the current laboratory assay.  If heparin results are below expected values, and patient dosage has  been confirmed, suggest follow up testing of antithrombin III levels. Performed at [facility], [place] [address], [place], [place] [address]   CBC     Status: Abnormal   Collection Time: 01/27/21  4:43 AM  Result Value Ref Range   WBC 5.1 4.0 - 10.5 K/uL   RBC 3.52 (L) 3.87 - 5.11 MIL/uL   Hemoglobin 10.9 (L) 12.0 - 15.0 g/dL   HCT 34.3 (L) 36.0 - 46.0 %   MCV 97.4 80.0 - 100.0 fL   MCH 31.0 26.0 - 34.0 pg   MCHC 31.8 30.0 - 36.0 g/dL   RDW 18.2 (H) 11.5 - 15.5 %   Platelets 165 150 - 400 K/uL   nRBC 0.0 0.0 - 0.2 %    Comment: Performed at [facility], [place] [address] [place], [place] [address]  Vancomycin, random     Status: None   Collection Time: 01/27/21  4:43 AM  Result Value Ref Range   Vancomycin Rm 18     Comment:        Random Vancomycin therapeutic range is dependent on dosage and time of specimen collection. A peak range is 20.0-40.0 ug/mL A trough range is 5.0-15.0 ug/mL        Performed at [facility] [address], [place], [place] [address]     Recent Results (from the past 240 hour(s))  Resp Panel by RT-PCR (Flu A&B, Covid) Nasopharyngeal Swab     Status: None   Collection Time: 01/23/21  3:04 PM   Specimen: Nasopharyngeal Swab; Nasopharyngeal(NP) swabs in vial transport medium  Result Value Ref Range Status   SARS Coronavirus 2 by RT PCR NEGATIVE NEGATIVE Final    Comment: (NOTE) SARS-CoV-2 target nucleic acids are NOT DETECTED.  The SARS-CoV-2 RNA is generally detectable in upper respiratory specimens during the acute phase of infection. The lowest concentration of SARS-CoV-2 viral copies this assay can detect is 138  copies/mL. A negative result does not preclude SARS-Cov-2 infection and should not be used as the sole basis for treatment or other patient management decisions. A negative result may occur with  improper specimen collection/handling, submission of specimen other than nasopharyngeal swab, presence of viral mutation(s) within the areas targeted by this assay, and inadequate number of viral copies(<138 copies/mL). A negative result must be combined with clinical observations, patient history, and epidemiological information. The expected result is Negative.  Fact Sheet for Patients:  EntrepreneurPulse.com.au  Fact Sheet for Healthcare Providers:  IncredibleEmployment.be  This test is no t yet approved or cleared by the [place] FDA and  has been authorized for detection and/or diagnosis of SARS-CoV-2 by FDA under an Emergency Use Authorization (EUA). This EUA will remain  in effect (meaning this test can be  used) for the duration of the COVID-19 declaration under Section 564(b)(1) of the Act, 21 U.S.C.section 360bbb-3(b)(1), unless the authorization is terminated  or revoked sooner.       Influenza A by PCR NEGATIVE NEGATIVE Final   Influenza B by PCR NEGATIVE NEGATIVE Final    Comment: (NOTE) The Xpert Xpress SARS-CoV-2/FLU/RSV plus assay is intended as an aid in the diagnosis of influenza from Nasopharyngeal swab specimens and should not be used as a sole basis for treatment. Nasal washings and aspirates are unacceptable for Xpert Xpress SARS-CoV-2/FLU/RSV testing.  Fact Sheet for Patients: EntrepreneurPulse.com.au  Fact Sheet for Healthcare Providers: IncredibleEmployment.be  This test is not yet approved or cleared by the [place] FDA and has been authorized for detection and/or diagnosis of SARS-CoV-2 by FDA under an Emergency Use Authorization (EUA). This EUA will remain in effect (meaning  this test can be used) for the duration of the COVID-19 declaration under Section 564(b)(1) of the Act, 21 U.S.C. section 360bbb-3(b)(1), unless the authorization is terminated or revoked.  Performed at [facility], [address], [place], [place] [address]   Culture, blood (routine x 2)     Status: None (Preliminary result)   Collection Time: 01/24/21  1:31 PM   Specimen: BLOOD RIGHT HAND  Result Value Ref Range Status   Specimen Description BLOOD RIGHT HAND  Final   Special Requests   Final    Immunocompromised BOTTLES DRAWN AEROBIC AND ANAEROBIC Blood Culture results may not be optimal due to an inadequate volume of blood received in culture bottles   Culture   Final    NO GROWTH 3 DAYS Performed at [facility], [address] [place], [place] [address]    Report Status PENDING  Incomplete  Culture, blood (routine x 2)     Status: None (Preliminary result)   Collection Time: 01/24/21  1:31 PM   Specimen: BLOOD LEFT HAND  Result Value Ref Range Status   Specimen Description BLOOD LEFT HAND  Final   Special Requests   Final    Immunocompromised BOTTLES DRAWN AEROBIC AND ANAEROBIC Blood Culture adequate volume   Culture   Final    NO GROWTH 3 DAYS Performed at [facility], [address] [place], [place] [address]    Report Status PENDING  Incomplete  MRSA Next Gen by PCR, Nasal     Status: None   Collection Time: 01/25/21  4:14 AM  Result Value Ref Range Status   MRSA by PCR Next Gen NOT DETECTED NOT DETECTED Final    Comment: (NOTE) The GeneXpert MRSA Assay (FDA approved for NASAL specimens only), is one component of a comprehensive MRSA colonization surveillance program. It is not intended to diagnose MRSA infection nor to guide or monitor treatment for MRSA infections. Test performance is not FDA approved in patients less than 8 years old. Performed at [facility], [place] [address] [place], [place] [address]   CSF culture w Gram Stain     Status: None  (Preliminary result)   Collection Time: 01/25/21  2:43 PM   Specimen: CSF; Cerebrospinal Fluid  Result Value Ref Range Status   Specimen Description CSF  Final   Special Requests Immunocompromised  Final   Gram Stain   Final    WBC PRESENT, PREDOMINANTLY PMN NO ORGANISMS SEEN CYTOSPIN SMEAR    Culture   Final    NO GROWTH 2 DAYS Performed at [facility], [place] [address], [place],  [address]  Report Status PENDING  Incomplete    Lipid Panel Recent Labs    01/26/21 0950  TRIG 117    Studies/Results: DG Abd Portable 1V  Result Date: 01/27/2021 CLINICAL DATA:  Post feeding tube placement. EXAM: PORTABLE ABDOMEN - 1 VIEW COMPARISON:  CT abdomen and pelvis 10/01/2019. FINDINGS: Feeding tube tip is at the level of the gastric antrum. No dilated bowel loops visualized. No acute fractures. IMPRESSION: Feeding tube tip at the level of the gastric antrum. Electronically Signed   By: Ronney Asters M.D.   On: 01/27/2021 15:40   Overnight EEG with video  Result Date: 01/26/2021 Lora Havens, MD     01/27/2021  8:28 AM Patient Name: [name] MRN: [id number] Epilepsy Attending: Lora Havens Referring Physician/Provider: Dr Donnetta Simpers Duration: 01/25/2021 1021 to 01/26/2021 0200, 01/26/2021 0821- 1700  Patient history: 70 year old female who presented with altered mental status, fever and seizure.  EEG to evaluate for seizure.  Level of alertness:  lethargic  AEDs during EEG study: LEV, LCM  Technical aspects: This EEG study was done with scalp electrodes positioned according to the 10-20 International system of electrode placement. Electrical activity was acquired at a sampling rate of 500Hz  and reviewed with a high frequency filter of 70Hz  and a low frequency filter of 1Hz . EEG data were recorded continuously and digitally stored.  Description: No posterior dominant rhythm was seen.  EEG showed continuous generalized polymorphic sharply contoured 3 to 6 Hz  theta-delta slowing. Generalized periodic discharges with triphasic morphology were noted with fluctuation frequency between 1.5-2 Hz were also noted. At around 1400, frequency of generalized periodic discharges worsened to 2.5hz  and appeared more rhythmic which lasted for about an hour and improved after IV vimpat was administered. 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  Hyperventilation and photic stimulation were not performed. Of note, study was disconnected between 0200 to 0821 on 01/26/2021.   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 Additionally EEG showed generalized periodic discharges with triphasic morphology which is on the ictal-interictal continuum and low to intermediate potential for seizure recurrence. This eeg pattern can also be seen due to toxic-metabolic causes like uremia, hyperammonemia. EEG is also suggestive of moderate diffuse encephalopathy, nonspecific etiology.  Priyanka Barbra Sarks    Medications: Scheduled:  calcitRIOL  1.25 mcg Oral Q T,Th,Sa-HD   Chlorhexidine Gluconate Cloth  6 each Topical Q0600   Chlorhexidine Gluconate Cloth  6 each Topical Q0600   [START ON 01/28/2021] Chlorhexidine Gluconate Cloth  6 each Topical Q0600   cinacalcet  90 mg Oral Q T,Th,Sa-HD   clopidogrel  75 mg Oral Q breakfast   collagenase  1 application Topical Daily   [START ON 01/28/2021] darbepoetin (ARANESP) injection - DIALYSIS  40 mcg Intravenous Q Tue-HD   dolutegravir  50 mg Oral q1600   lamiVUDine  50 mg Oral Daily   lidocaine-prilocaine  1 application Topical Q T,Th,Sa-HD   pantoprazole (PROTONIX) IV  40 mg Intravenous QHS   senna  2 tablet Oral  QPM   sodium chloride flush  3 mL Intravenous Q12H   thiamine injection  100 mg Intravenous Daily   [START ON 01/28/2021] vancomycin  500 mg Intravenous Q T,Th,Sa-HD   zidovudine  100 mg Oral TID  Continuous:  sodium chloride Stopped (01/25/21 1641)   sodium chloride     sodium chloride     sodium chloride     sodium chloride     amiodarone 30 mg/hr (01/27/21 1700)   clevidipine 12 mg/hr (01/27/21 1700)   heparin 850 Units/hr (01/27/21 1700)   lacosamide (VIMPAT) IV Stopped (01/27/21 1034)   [START ON 01/28/2021] levETIRAcetam     levETIRAcetam Stopped (01/27/21 2620)   Assessment: 71 yo female w/ PMH significant for ESRD on HD, HIV, atrial flutter on Eliquis, p/w syncope during dialysis. MRI brain c/w PRES. EEG showed NCSE that improved after keppra + vimpat (on max dose of each for ESRD). Exam has been trending towards overall improvement over time. Febrile at outside hospital, LP not c/f infection, no indication for coverage (incl acyclovir) per ID. Suspect PRES 2/2 HTN, BP has been controlled since arrival to [facility]. - Exam today with poor cooperation. No seizure-like activity noted. No gross changes from yesterday's documented findings.  - LTM EEG report for today (11/28): This study showed generalized periodic discharges with triphasic morphology which is on the ictal-interictal continuum and low potential for seizure recurrence. This eeg pattern can also be seen due to toxic-metabolic causes like uremia, hyperammonemia. EEG is also suggestive of moderate diffuse encephalopathy, nonspecific etiology. EEG appears to be improving compared to previous day.  Recommendations:  - continue Keppra 1000mg  daily + extra 250mg  after dialysis tiw - Continue vimpat 150mg  bid - Keppra and vimpat currently both at max doses for ESRD - Continue LTM - BP control    LOS: 4 days   @Electronically  signed: Dr. Kerney Elbe 01/27/2021  5:34 PM

## 2021-01-27 NOTE — Progress Notes (Signed)
Speech Language Pathology Treatment: Dysphagia  Patient Details Name: [name] MRN: [id number] DOB: [birth year] Today's Date: 01/27/2021 Time: 8546-2703 SLP Time Calculation (min) (ACUTE ONLY): 16 min  Assessment / Plan / Recommendation Clinical Impression  Pt was seen for therapy targeting swallowing goals. Overall, pt alertness improving from previous session, however still presents with significant lingual edema resulting in open mouth posture and difficulty manipulating boluses. Pt also exhibited wet vocal quality demonstrating poor sensation and management of secretions. SLP trialed ice chips, thin liquid, and puree. Oral function impacted by lingual edema, requiring bolus administrations ~1/3 of a teaspoon across consistencies. Thin liquid easily accessed through straw. Pharyngeally, pt exhibited increased wet vocal quality after thin liquids from straw and cup and immediate throat clear with puree. Pt will require FEES to objectively view swallow function at bedside d/t continuous EEG. Recommend NPO, administering medications via alternate means until results from objective study are rendered.    HPI HPI: [name] is a 70 y.o. female with medical history significant for ESRD on HD TTS, GERD, TIA, HIV, atrial flutter on Eliquis, anemia of chronic disease, chronic pain. Admitted from hemodialysis with syncopal episode, altered mentation, elevated blood pressure readings and seizure activity overnight.  MRI no acute CVA and findings concerning for PRES; Cortical and subcortical edema in the occipital parietal lobes  bilaterally, right for slightly greater than left.  BSE 11/20/20 prolonged mastication, recommended Dys 2, thin and upgraded to regular several days later.      SLP Plan  New goals to be determined pending instrumental study      Recommendations for follow up therapy are one component of a multi-disciplinary discharge planning process, led by the attending physician.   Recommendations may be updated based on patient status, additional functional criteria and insurance authorization.    Recommendations  Diet recommendations: NPO Medication Administration: Via alternative means                Oral Care Recommendations: Oral care QID Follow Up Recommendations: Skilled nursing-short term rehab (<3 hours/day) Assistance recommended at discharge: Other (comment) (tbd) SLP Visit Diagnosis: Dysphagia, unspecified (R13.10) Plan: New goals to be determined pending instrumental study       GO              Dewitt Rota, SLP-Student   Dewitt Rota  01/27/2021, 9:58 AM

## 2021-01-27 NOTE — Progress Notes (Signed)
PCCM Interval Progress Note  Spoke with Ms. Esquibel's daughter, Velna Hedgecock at [phone]. Provided a general update on patient's clinical status and plan of care for the day.  Given failed bedside SLP evaluation today, I spoke with Ms. Donath and her daughter regarding placement of a Cortrak tube for temporary enteral access for both medications and feeding. Previous GOC notes discuss "no feeding tube", but I believe this is in regard to PEG/permanent access. Patient and her daughter were both in agreement that a temporary Cortrak tube would be acceptable to aid in recovery and give [place] additional access for medications.  Cortrak to be placed by Nutrition team today and FEES to be completed tomorrow, 11/29 per SLP team.  Advised we will continue to be available by phone for any questions/concerns.  Lestine Mount, PA-C [facility] 01/27/21 2:55 PM  Please see [email or web address] for pager details.  From 7A-7P if no response, please call [phone] After hours, please call ELink [phone]

## 2021-01-27 NOTE — Consult Note (Signed)
[facility] Nurse Consult Note: Patient receiving care in [facility] [phone] Reason for Consult: R UE wound care Wound type: Right medial wounds in the bend of the upper arm and on the anterior side of the upper arm. Right arm I & D with Myriad skin graft placement on 01/07/21 Pressure Injury POA: NA Measurement: 2 x 2 and 4 x 2 Wound bed: Skin grafts were coming off, removed with some yellow slough that was loose on the wound.  Drainage (amount, consistency, odor) Serosanguinous on the dressing.  Periwound: Intact Dressing procedure/placement/frequency: Continue santyl to both wounds, followed by a moistened saline gauze and wrap with Kerlix. Change daily.  Monitor the wound area(s) for worsening of condition such as: Signs/symptoms of infection, increase in size, development of or worsening of odor, development of pain, or increased pain at the affected locations.   Notify the medical team if any of these develop.  Thank you for the consult. [facility] nurse will not follow at this time.   Please re-consult the [facility] team if needed.  Cathlean Marseilles Tamala Julian, MSN, RN, [place], Lysle Pearl, [facility] Wound Treatment Associate Pager [phone]

## 2021-01-28 ENCOUNTER — Inpatient Hospital Stay (HOSPITAL_COMMUNITY): Payer: Medicare Other

## 2021-01-28 DIAGNOSIS — I160 Hypertensive urgency: Secondary | ICD-10-CM

## 2021-01-28 DIAGNOSIS — N186 End stage renal disease: Secondary | ICD-10-CM | POA: Diagnosis not present

## 2021-01-28 DIAGNOSIS — I6783 Posterior reversible encephalopathy syndrome: Secondary | ICD-10-CM | POA: Diagnosis not present

## 2021-01-28 DIAGNOSIS — R569 Unspecified convulsions: Secondary | ICD-10-CM | POA: Diagnosis not present

## 2021-01-28 DIAGNOSIS — G9340 Encephalopathy, unspecified: Secondary | ICD-10-CM | POA: Diagnosis not present

## 2021-01-28 DIAGNOSIS — I4811 Longstanding persistent atrial fibrillation: Secondary | ICD-10-CM | POA: Diagnosis not present

## 2021-01-28 LAB — CBC
HCT: 29.6 % — ABNORMAL LOW (ref 36.0–46.0)
HCT: 29.5 % — ABNORMAL LOW (ref 36.0–46.0)
Hemoglobin: 9.2 g/dL — ABNORMAL LOW (ref 12.0–15.0)
Hemoglobin: 9.1 g/dL — ABNORMAL LOW (ref 12.0–15.0)
MCH: 30.1 pg (ref 26.0–34.0)
MCH: 30.1 pg (ref 26.0–34.0)
MCHC: 31.1 g/dL (ref 30.0–36.0)
MCHC: 30.8 g/dL (ref 30.0–36.0)
MCV: 96.7 fL (ref 80.0–100.0)
MCV: 97.7 fL (ref 80.0–100.0)
Platelets: 134 10*3/uL — ABNORMAL LOW (ref 150–400)
Platelets: 126 10*3/uL — ABNORMAL LOW (ref 150–400)
RBC: 3.06 MIL/uL — ABNORMAL LOW (ref 3.87–5.11)
RBC: 3.02 MIL/uL — ABNORMAL LOW (ref 3.87–5.11)
RDW: 18.2 % — ABNORMAL HIGH (ref 11.5–15.5)
RDW: 18 % — ABNORMAL HIGH (ref 11.5–15.5)
WBC: 4.7 10*3/uL (ref 4.0–10.5)
WBC: 4.1 10*3/uL (ref 4.0–10.5)
nRBC: 0 % (ref 0.0–0.2)
nRBC: 0 % (ref 0.0–0.2)

## 2021-01-28 LAB — RENAL FUNCTION PANEL
Albumin: 2.6 g/dL — ABNORMAL LOW (ref 3.5–5.0)
Anion gap: 17 — ABNORMAL HIGH (ref 5–15)
BUN: 21 mg/dL (ref 8–23)
CO2: 19 mmol/L — ABNORMAL LOW (ref 22–32)
Calcium: 8.7 mg/dL — ABNORMAL LOW (ref 8.9–10.3)
Chloride: 95 mmol/L — ABNORMAL LOW (ref 98–111)
Creatinine, Ser: 7.06 mg/dL — ABNORMAL HIGH (ref 0.44–1.00)
GFR, Estimated: 6 mL/min — ABNORMAL LOW (ref >60)
Glucose, Bld: 81 mg/dL (ref 70–99)
Phosphorus: 8 mg/dL — ABNORMAL HIGH (ref 2.5–4.6)
Potassium: 4.4 mmol/L (ref 3.5–5.1)
Sodium: 131 mmol/L — ABNORMAL LOW (ref 135–145)

## 2021-01-28 LAB — GLUCOSE, CAPILLARY: Glucose-Capillary: 123 mg/dL — ABNORMAL HIGH (ref 70–99)

## 2021-01-28 LAB — HSV 1/2 PCR, CSF
HSV-1 DNA: NEGATIVE
HSV-2 DNA: NEGATIVE

## 2021-01-28 LAB — HEPARIN LEVEL (UNFRACTIONATED): Heparin Unfractionated: 0.34 IU/mL (ref 0.30–0.70)

## 2021-01-28 LAB — MAGNESIUM: Magnesium: 1.9 mg/dL (ref 1.7–2.4)

## 2021-01-28 IMAGING — DX DG ABDOMEN 1V
1 series · 1 of 1 positions shown · non-contrast
Comparison: Portable exam [3I] hours compared to [DATE]

CLINICAL DATA: Feeding tube placement

EXAM:
ABDOMEN - 1 VIEW

[abdomen supine]
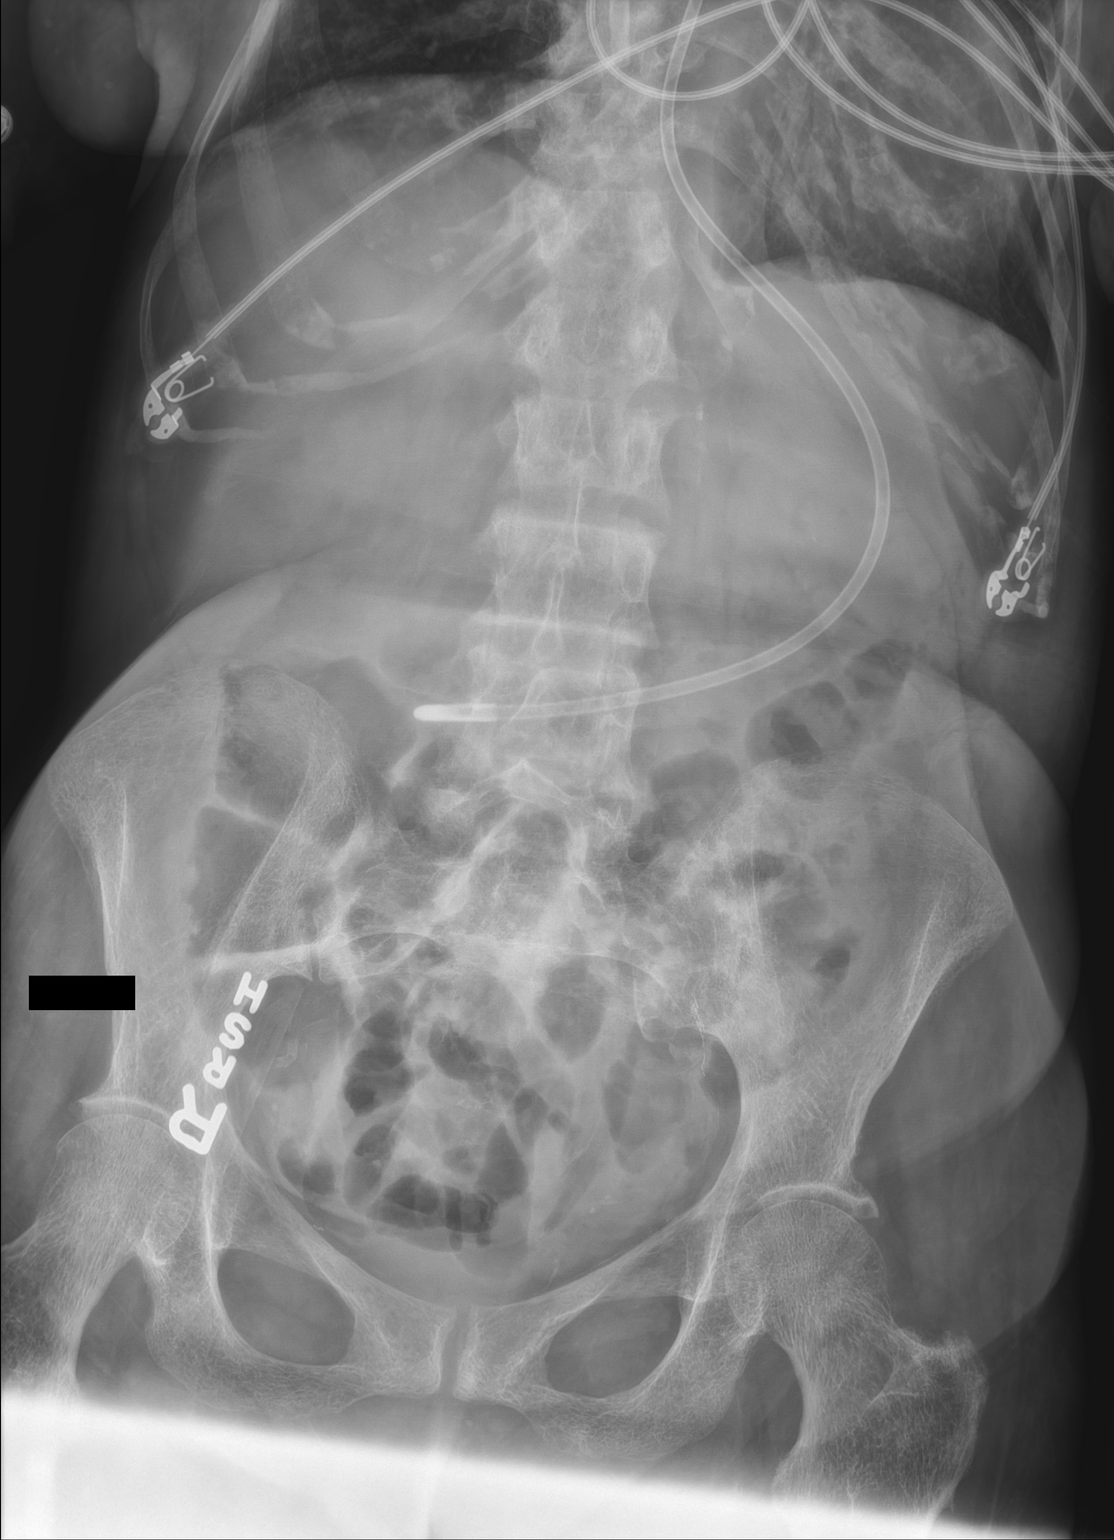

[1 of 1 positions shown; findings below may reference images not displayed]

FINDINGS: Tip of feeding tube projects over distal gastric antrum/pylorus.

Nonobstructive bowel gas pattern.

Bones demineralized.

Lung bases clear.
IMPRESSION: Tip of feeding tube projects over distal gastric antrum/pylorus.

## 2021-01-28 MED ORDER — MIRTAZAPINE 15 MG PO TABS
15.0000 mg | ORAL_TABLET | Freq: Every day | ORAL | Status: DC
  Administered 2021-01-28 – 2021-01-31 (×4): 15 mg
  Filled 2021-01-28 (×4): qty 1

## 2021-01-28 MED ORDER — SODIUM CHLORIDE 0.9 % IV SOLN: 100.0000 mL | INTRAVENOUS | Status: DC | PRN

## 2021-01-28 MED ORDER — METOPROLOL TARTRATE 25 MG PO TABS
25.0000 mg | ORAL_TABLET | Freq: Two times a day (BID) | ORAL | Status: DC
Start: 2021-01-28 — End: 2021-02-01
  Administered 2021-01-28 – 2021-01-31 (×7): 25 mg
  Filled 2021-01-28 (×8): qty 1

## 2021-01-28 MED ORDER — AMLODIPINE BESYLATE 10 MG PO TABS
ORAL_TABLET | ORAL | Status: AC
  Filled 2021-01-28: qty 1

## 2021-01-28 MED ORDER — HEPARIN SODIUM (PORCINE) 1000 UNIT/ML DIALYSIS: 20.0000 [IU]/kg | INTRAMUSCULAR | Status: DC | PRN

## 2021-01-28 MED ORDER — AMLODIPINE BESYLATE 10 MG PO TABS
10.0000 mg | ORAL_TABLET | Freq: Every day | ORAL | Status: DC
  Administered 2021-01-28 – 2021-02-03 (×7): 10 mg via ORAL
  Filled 2021-01-28 (×2): qty 1
  Filled 2021-01-28: qty 2
  Filled 2021-01-28 (×3): qty 1

## 2021-01-28 MED ORDER — HEPARIN SODIUM (PORCINE) 1000 UNIT/ML DIALYSIS
1000.0000 [IU] | INTRAMUSCULAR | Status: DC | PRN
  Administered 2021-01-28: 1000 [IU] via INTRAVENOUS_CENTRAL

## 2021-01-28 MED ORDER — METOPROLOL TARTRATE 25 MG/10 ML ORAL SUSPENSION
ORAL | Status: AC
  Filled 2021-01-28: qty 10

## 2021-01-28 MED ORDER — CLEVIDIPINE BUTYRATE 0.5 MG/ML IV EMUL
0.0000 mg/h | INTRAVENOUS | Status: DC
  Administered 2021-01-28: 13 mg/h via INTRAVENOUS
  Administered 2021-01-29: 10 mg/h via INTRAVENOUS
  Administered 2021-01-29: 11 mg/h via INTRAVENOUS
  Filled 2021-01-28 (×3): qty 100

## 2021-01-28 MED ORDER — POLYETHYLENE GLYCOL 3350 17 G PO PACK: 17.0000 g | PACK | Freq: Every day | ORAL | Status: DC

## 2021-01-28 MED ORDER — ALTEPLASE 2 MG IJ SOLR
2.0000 mg | Freq: Once | INTRAMUSCULAR | Status: DC | PRN
  Filled 2021-01-28: qty 2

## 2021-01-28 MED ORDER — VENLAFAXINE HCL 75 MG PO TABS
75.0000 mg | ORAL_TABLET | Freq: Two times a day (BID) | ORAL | Status: DC
  Administered 2021-01-28 – 2021-01-31 (×8): 75 mg
  Filled 2021-01-28 (×11): qty 1

## 2021-01-28 MED ORDER — AMIODARONE HCL 200 MG PO TABS
200.0000 mg | ORAL_TABLET | Freq: Two times a day (BID) | ORAL | Status: DC
Start: 2021-01-28 — End: 2021-01-28

## 2021-01-28 MED ORDER — HYDRALAZINE HCL 50 MG PO TABS
ORAL_TABLET | ORAL | Status: AC
  Filled 2021-01-28: qty 1

## 2021-01-28 MED ORDER — OSMOLITE 1.2 CAL PO LIQD
1000.0000 mL | ORAL | Status: DC
  Administered 2021-01-28 – 2021-01-30 (×3): 1000 mL
  Filled 2021-01-28 (×7): qty 1000

## 2021-01-28 MED ORDER — LIDOCAINE-PRILOCAINE 2.5-2.5 % EX CREA: 1.0000 "application " | TOPICAL_CREAM | CUTANEOUS | Status: DC | PRN

## 2021-01-28 MED ORDER — PENTAFLUOROPROP-TETRAFLUOROETH EX AERO: 1.0000 "application " | INHALATION_SPRAY | CUTANEOUS | Status: DC | PRN

## 2021-01-28 MED ORDER — OSMOLITE 1.2 CAL PO LIQD
1000.0000 mL | ORAL | Status: DC
Start: 2021-01-28 — End: 2021-01-28

## 2021-01-28 MED ORDER — POLYETHYLENE GLYCOL 3350 17 G PO PACK
17.0000 g | PACK | Freq: Every day | ORAL | Status: DC
  Filled 2021-01-28 (×3): qty 1

## 2021-01-28 MED ORDER — PROSOURCE TF PO LIQD
45.0000 mL | Freq: Every day | ORAL | Status: DC
  Administered 2021-01-28 – 2021-01-31 (×4): 45 mL
  Filled 2021-01-28 (×4): qty 45

## 2021-01-28 MED ORDER — LIDOCAINE HCL (PF) 1 % IJ SOLN
5.0000 mL | INTRAMUSCULAR | Status: DC | PRN
  Filled 2021-01-28: qty 5

## 2021-01-28 MED ORDER — PROSOURCE TF PO LIQD
ORAL | Status: AC
  Filled 2021-01-28: qty 45

## 2021-01-28 MED ORDER — VENLAFAXINE HCL 50 MG PO TABS
ORAL_TABLET | ORAL | Status: AC
  Filled 2021-01-28: qty 2

## 2021-01-28 MED ORDER — HYDRALAZINE HCL 50 MG PO TABS
50.0000 mg | ORAL_TABLET | Freq: Three times a day (TID) | ORAL | Status: DC
  Administered 2021-01-28 – 2021-01-31 (×8): 50 mg via ORAL
  Filled 2021-01-28 (×7): qty 1

## 2021-01-28 MED ORDER — CALCIUM ACETATE (PHOS BINDER) 667 MG/5ML PO SOLN
1334.0000 mg | Freq: Three times a day (TID) | ORAL | Status: DC
  Administered 2021-01-28 – 2021-01-31 (×9): 1334 mg
  Filled 2021-01-28 (×16): qty 10

## 2021-01-28 NOTE — Progress Notes (Signed)
50 mg of IV lacosamide was wasted in the Main Pharmacy's Stericycle container. Anette Guarneri, PharmD, was witness.

## 2021-01-28 NOTE — Progress Notes (Signed)
Subjective: No significant overnight events.  Objective: Current vital signs: BP 121/80   Pulse 74   Temp 98.8 F (37.1 C) (Oral)   Resp (!) 22   Wt 43.4 kg   LMP  (LMP Unknown)   SpO2 97%   BMI 17.22 kg/m  Vital signs in last 24 hours: Temp:  [98.2 F (36.8 C)-99.1 F (37.3 C)] 98.8 F (37.1 C) (11/29 0732) Pulse Rate:  [68-219] 74 (11/29 0732) Resp:  [17-36] 22 (11/29 0732) BP: (105-155)/(70-101) 121/80 (11/29 0732) SpO2:  [82 %-100 %] 97 % (11/29 0732)  Intake/Output from previous day: 11/28 0701 - 11/29 0700 In: 1390.4 [I.V.:1131.5; IV Piggyback:258.9] Out: -  Intake/Output this shift: No intake/output data recorded. Nutritional status:  Diet Order             Diet NPO time specified Except for: Sips with Meds  Diet effective now                   HEENT: NGT in place.  Lungs: Respirations unlabored   Neurologic Exam: Ment: Awake and alert. Interacts with examiner. Sparse speech output. Not oriented to the day of the week. Dysarthric. Able to name a thumb on first try, a pinky on second try.   CN: EOMI. PERRL. Face symmetric.  Motor: Moves BUE extremities equally with 4-/5 strength. Moves BLE equally and will bend at knees to request while dragging feet along bed, but will not elevate antigravity.  Sensory: Reacts to FT x 4.  Cerebellar: No gross ataxia noted.  Reflexes: Brisk, low amplitude.    Lab Results: Results for orders placed or performed during the hospital encounter of 01/23/21 (from the past 48 hour(s))  Triglycerides     Status: None   Collection Time: 01/26/21  9:50 AM  Result Value Ref Range   Triglycerides 117 <150 mg/dL    Comment: Performed at [facility], [place] [address], [place], [place] [address]  Renal function panel     Status: Abnormal   Collection Time: 01/27/21  4:43 AM  Result Value Ref Range   Sodium 132 (L) 135 - 145 mmol/L    Comment: DELTA CHECK NOTED   Potassium 4.2 3.5 - 5.1 mmol/L   Chloride 97 (L) 98 - 111  mmol/L   CO2 21 (L) 22 - 32 mmol/L   Glucose, Bld 77 70 - 99 mg/dL    Comment: Glucose reference range applies only to samples taken after fasting for at least 8 hours.   BUN 16 8 - 23 mg/dL   Creatinine, Ser 5.49 (H) 0.44 - 1.00 mg/dL    Comment: DELTA CHECK NOTED   Calcium 8.7 (L) 8.9 - 10.3 mg/dL   Phosphorus 6.3 (H) 2.5 - 4.6 mg/dL   Albumin 2.7 (L) 3.5 - 5.0 g/dL   GFR, Estimated 8 (L) >60 mL/min    Comment: (NOTE) Calculated using the CKD-EPI Creatinine Equation (2021)    Anion gap 14 5 - 15    Comment: Performed at [facility] [address] [place], [place] [address]  Heparin level (unfractionated)     Status: None   Collection Time: 01/27/21  4:43 AM  Result Value Ref Range   Heparin Unfractionated 0.37 0.30 - 0.70 IU/mL    Comment: (NOTE) The clinical reportable range upper limit is being lowered to >1.10 to align with the FDA approved guidance for the current laboratory assay.  If heparin results are below expected values, and patient dosage has  been confirmed, suggest follow up testing of antithrombin III levels. Performed at [facility], [place] [address] [place], [place] [address]   CBC     Status: Abnormal   Collection Time: 01/27/21  4:43 AM  Result Value Ref Range   WBC 5.1 4.0 - 10.5 K/uL   RBC 3.52 (L) 3.87 - 5.11 MIL/uL   Hemoglobin 10.9 (L) 12.0 - 15.0 g/dL   HCT 34.3 (L) 36.0 - 46.0 %   MCV 97.4 80.0 - 100.0 fL   MCH 31.0 26.0 - 34.0 pg   MCHC 31.8 30.0 - 36.0 g/dL   RDW 18.2 (H) 11.5 - 15.5 %   Platelets 165 150 - 400 K/uL   nRBC 0.0 0.0 - 0.2 %    Comment: Performed at [facility], [place] [address] [place], [place] [address]  Vancomycin, random     Status: None   Collection Time: 01/27/21  4:43 AM  Result Value Ref Range   Vancomycin Rm 18     Comment:        Random Vancomycin therapeutic range is dependent on dosage and time of specimen collection. A peak range is 20.0-40.0 ug/mL A trough range is 5.0-15.0 ug/mL         Performed at [facility] [address], [place], [place] [address]   Heparin level (unfractionated)     Status: None   Collection Time: 01/28/21  3:31 AM  Result Value Ref Range   Heparin Unfractionated 0.34 0.30 - 0.70 IU/mL    Comment: (NOTE) The clinical reportable range upper limit is being lowered to >1.10 to align with the FDA approved guidance for the current laboratory assay.  If heparin results are below expected values, and patient dosage has  been confirmed, suggest follow up testing of antithrombin III levels. Performed at [facility], [place] [address], [place], [place] [address]   CBC     Status: Abnormal   Collection Time: 01/28/21  3:31 AM  Result Value Ref Range   WBC 4.7 4.0 - 10.5 K/uL   RBC 3.06 (L) 3.87 - 5.11 MIL/uL   Hemoglobin 9.2 (L) 12.0 - 15.0 g/dL   HCT 29.6 (L) 36.0 - 46.0 %   MCV 96.7 80.0 - 100.0 fL   MCH 30.1 26.0 - 34.0 pg   MCHC 31.1 30.0 - 36.0 g/dL   RDW 18.2 (H) 11.5 - 15.5 %   Platelets 134 (L) 150 - 400 K/uL    Comment: REPEATED TO VERIFY   nRBC 0.0 0.0 - 0.2 %    Comment: Performed at [facility], [place] [address], [place], [place] [address]  Renal function panel     Status: Abnormal   Collection Time: 01/28/21  3:31 AM  Result Value Ref Range   Sodium 131 (L) 135 - 145 mmol/L   Potassium 4.4 3.5 - 5.1 mmol/L   Chloride 95 (L) 98 - 111 mmol/L   CO2 19 (L) 22 - 32 mmol/L   Glucose, Bld 81 70 - 99 mg/dL    Comment: Glucose reference range applies only to samples taken after fasting for at least 8 hours.   BUN 21 8 - 23 mg/dL   Creatinine, Ser 7.06 (H) 0.44 - 1.00 mg/dL   Calcium 8.7 (L) 8.9 - 10.3 mg/dL   Phosphorus 8.0 (H) 2.5 - 4.6 mg/dL   Albumin 2.6 (L) 3.5 - 5.0 g/dL   GFR, Estimated 6 (L) >60 mL/min  Comment: (NOTE) Calculated using the CKD-EPI Creatinine Equation (2021)    Anion gap 17 (H) 5 - 15    Comment: Performed at [facility], [place] [address] [place], [place] [address]  Magnesium      Status: None   Collection Time: 01/28/21  3:31 AM  Result Value Ref Range   Magnesium 1.9 1.7 - 2.4 mg/dL    Comment: Performed at [facility] [address] [place], [place] [address]    Recent Results (from the past 240 hour(s))  Resp Panel by RT-PCR (Flu A&B, Covid) Nasopharyngeal Swab     Status: None   Collection Time: 01/23/21  3:04 PM   Specimen: Nasopharyngeal Swab; Nasopharyngeal(NP) swabs in vial transport medium  Result Value Ref Range Status   SARS Coronavirus 2 by RT PCR NEGATIVE NEGATIVE Final    Comment: (NOTE) SARS-CoV-2 target nucleic acids are NOT DETECTED.  The SARS-CoV-2 RNA is generally detectable in upper respiratory specimens during the acute phase of infection. The lowest concentration of SARS-CoV-2 viral copies this assay can detect is 138 copies/mL. A negative result does not preclude SARS-Cov-2 infection and should not be used as the sole basis for treatment or other patient management decisions. A negative result may occur with  improper specimen collection/handling, submission of specimen other than nasopharyngeal swab, presence of viral mutation(s) within the areas targeted by this assay, and inadequate number of viral copies(<138 copies/mL). A negative result must be combined with clinical observations, patient history, and epidemiological information. The expected result is Negative.  Fact Sheet for Patients:  EntrepreneurPulse.com.au  Fact Sheet for Healthcare Providers:  IncredibleEmployment.be  This test is no t yet approved or cleared by the [place] FDA and  has been authorized for detection and/or diagnosis of SARS-CoV-2 by FDA under an Emergency Use Authorization (EUA). This EUA will remain  in effect (meaning this test can be used) for the duration of the COVID-19 declaration under Section 564(b)(1) of the Act, 21 U.S.C.section 360bbb-3(b)(1), unless the authorization is terminated  or  revoked sooner.       Influenza A by PCR NEGATIVE NEGATIVE Final   Influenza B by PCR NEGATIVE NEGATIVE Final    Comment: (NOTE) The Xpert Xpress SARS-CoV-2/FLU/RSV plus assay is intended as an aid in the diagnosis of influenza from Nasopharyngeal swab specimens and should not be used as a sole basis for treatment. Nasal washings and aspirates are unacceptable for Xpert Xpress SARS-CoV-2/FLU/RSV testing.  Fact Sheet for Patients: EntrepreneurPulse.com.au  Fact Sheet for Healthcare Providers: IncredibleEmployment.be  This test is not yet approved or cleared by the [place] FDA and has been authorized for detection and/or diagnosis of SARS-CoV-2 by FDA under an Emergency Use Authorization (EUA). This EUA will remain in effect (meaning this test can be used) for the duration of the COVID-19 declaration under Section 564(b)(1) of the Act, 21 U.S.C. section 360bbb-3(b)(1), unless the authorization is terminated or revoked.  Performed at [facility], [address] [place], [place] [address]   Culture, blood (routine x 2)     Status: None (Preliminary result)   Collection Time: 01/24/21  1:31 PM   Specimen: BLOOD RIGHT HAND  Result Value Ref Range Status   Specimen Description BLOOD RIGHT HAND  Final   Special Requests   Final    Immunocompromised BOTTLES DRAWN AEROBIC AND ANAEROBIC Blood Culture results may not be optimal due to an inadequate volume of blood received in culture bottles   Culture   Final  NO GROWTH 3 DAYS Performed at [facility], [address], [place], [place] [address]    Report Status PENDING  Incomplete  Culture, blood (routine x 2)     Status: None (Preliminary result)   Collection Time: 01/24/21  1:31 PM   Specimen: BLOOD LEFT HAND  Result Value Ref Range Status   Specimen Description BLOOD LEFT HAND  Final   Special Requests   Final    Immunocompromised BOTTLES DRAWN AEROBIC AND ANAEROBIC Blood Culture  adequate volume   Culture   Final    NO GROWTH 3 DAYS Performed at [facility], [address] [place], [place] [address]    Report Status PENDING  Incomplete  MRSA Next Gen by PCR, Nasal     Status: None   Collection Time: 01/25/21  4:14 AM  Result Value Ref Range Status   MRSA by PCR Next Gen NOT DETECTED NOT DETECTED Final    Comment: (NOTE) The GeneXpert MRSA Assay (FDA approved for NASAL specimens only), is one component of a comprehensive MRSA colonization surveillance program. It is not intended to diagnose MRSA infection nor to guide or monitor treatment for MRSA infections. Test performance is not FDA approved in patients less than 49 years old. Performed at [facility], [place] [address] [place], [place] [address]   CSF culture w Gram Stain     Status: None (Preliminary result)   Collection Time: 01/25/21  2:43 PM   Specimen: CSF; Cerebrospinal Fluid  Result Value Ref Range Status   Specimen Description CSF  Final   Special Requests Immunocompromised  Final   Gram Stain   Final    WBC PRESENT, PREDOMINANTLY PMN NO ORGANISMS SEEN CYTOSPIN SMEAR    Culture   Final    NO GROWTH 2 DAYS Performed at [facility], [place] [address], [place], [place] [address]    Report Status PENDING  Incomplete    Lipid Panel Recent Labs    01/26/21 0950  TRIG 117    Studies/Results: DG Abd Portable 1V  Result Date: 01/27/2021 CLINICAL DATA:  Post feeding tube placement. EXAM: PORTABLE ABDOMEN - 1 VIEW COMPARISON:  CT abdomen and pelvis 10/01/2019. FINDINGS: Feeding tube tip is at the level of the gastric antrum. No dilated bowel loops visualized. No acute fractures. IMPRESSION: Feeding tube tip at the level of the gastric antrum. Electronically Signed   By: Ronney Asters M.D.   On: 01/27/2021 15:40   Overnight EEG with video  Result Date: 01/26/2021 Lora Havens, MD     01/27/2021  8:28 AM Patient Name: DANEL STUDZINSKI MRN: [id number] Epilepsy Attending:  Lora Havens Referring Physician/Provider: Dr Donnetta Simpers Duration: 01/25/2021 1021 to 01/26/2021 0200, 01/26/2021 0821- 1700  Patient history: 70 year old female who presented with altered mental status, fever and seizure.  EEG to evaluate for seizure.  Level of alertness:  lethargic  AEDs during EEG study: LEV, LCM  Technical aspects: This EEG study was done with scalp electrodes positioned according to the 10-20 International system of electrode placement. Electrical activity was acquired at a sampling rate of 500Hz  and reviewed with a high frequency filter of 70Hz  and a low frequency filter of 1Hz . EEG data were recorded continuously and digitally stored.  Description: No posterior dominant rhythm was seen.  EEG showed continuous generalized polymorphic sharply contoured 3 to 6 Hz theta-delta slowing. Generalized periodic discharges with triphasic morphology were noted with fluctuation frequency between 1.5-2 Hz were also noted. At around 1400,  frequency of generalized periodic discharges worsened to 2.5hz  and appeared more rhythmic which lasted for about an hour and improved after IV vimpat was administered. 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  Hyperventilation and photic stimulation were not performed. Of note, study was disconnected between 0200 to 0821 on 01/26/2021.   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 Additionally EEG showed generalized periodic discharges with triphasic morphology which is on the ictal-interictal continuum and low to intermediate potential for seizure recurrence. This eeg pattern can also be seen due to toxic-metabolic causes  like uremia, hyperammonemia. EEG is also suggestive of moderate diffuse encephalopathy, nonspecific etiology.  Priyanka Barbra Sarks    Medications: Scheduled:  calcitRIOL  1.25 mcg Per Tube Q T,Th,Sa-HD   chlorhexidine  15 mL Mouth Rinse BID   Chlorhexidine Gluconate Cloth  6 each Topical Q0600   cinacalcet  90 mg Oral Q T,Th,Sa-HD   clopidogrel  75 mg Per Tube Q breakfast   collagenase  1 application Topical Daily   darbepoetin (ARANESP) injection - DIALYSIS  40 mcg Intravenous Q Tue-HD   dolutegravir  50 mg Per Tube q1600   lamiVUDine  50 mg Per Tube Daily   lidocaine-prilocaine  1 application Topical Q T,Th,Sa-HD   mouth rinse  15 mL Mouth Rinse q12n4p   pantoprazole (PROTONIX) IV  40 mg Intravenous QHS   senna  2 tablet Per Tube QPM   sodium chloride flush  3 mL Intravenous Q12H   thiamine injection  100 mg Intravenous Daily   vancomycin  500 mg Intravenous Q T,Th,Sa-HD   Zidovudine  100 mg Per Tube Q8H   Continuous:  sodium chloride Stopped (01/25/21 1641)   sodium chloride     sodium chloride     sodium chloride     sodium chloride     amiodarone 30 mg/hr (01/28/21 0600)   clevidipine 10 mg/hr (01/28/21 0724)   heparin 850 Units/hr (01/28/21 0600)   lacosamide (VIMPAT) IV Stopped (01/27/21 2212)   levETIRAcetam     levETIRAcetam 400 mL/hr at 01/28/21 0600    Assessment: 70 yo female w/ PMH significant for ESRD on HD, HIV, atrial flutter on Eliquis, p/w syncope during dialysis. MRI brain c/w PRES. EEG showed NCSE that improved after keppra + vimpat (on max dose of each for ESRD). Exam has been trending towards overall improvement over time. Febrile at outside hospital, LP not c/f infection, no indication for coverage (incl acyclovir) per ID. Suspect PRES 2/2 HTN, BP has been controlled since arrival to [facility]. - Exam today is stable relative to Dr. Artemio Aly findings on Sunday, taking into account differences in exam technique.  - LTM EEG report for yesterday (11/28): This study  showed generalized periodic discharges with triphasic morphology which is on the ictal-interictal continuum and low potential for seizure recurrence. This eeg pattern can also be seen due to toxic-metabolic causes like uremia, hyperammonemia. EEG is also suggestive of moderate diffuse encephalopathy, nonspecific etiology. EEG appears to be improving compared to previous day. - LTM EEG report for today: Pending   Recommendations:  - Continue Keppra 1000mg  daily + extra 250mg  after dialysis tiw - Continue vimpat 150mg  bid - Keppra and vimpat currently both at max doses for ESRD - Discontinuing LTM  -  BP control - Will need outpatient Neurology follow up - Neurohospitalist service will sign off. Please call if there are additional questions.   35 minutes spent in the neurological evaluation and management of this critically ill patient.    LOS: 5 days   @Electronically  signed: Dr. Kerney Elbe 01/28/2021  7:44 AM

## 2021-01-28 NOTE — Procedures (Signed)
Objective Swallowing Evaluation: Type of Study: FEES-Fiberoptic Endoscopic Evaluation of Swallow   Patient Details  Name: [name] MRN: [id number] Date of Birth: 06/25/1950  Today's Date: 01/28/2021 Time: SLP Start Time (ACUTE ONLY): 75 -SLP Stop Time (ACUTE ONLY): 9024  SLP Time Calculation (min) (ACUTE ONLY): 32 min   Past Medical History:  Past Medical History:  Diagnosis Date   Anemia    Anxiety    Atrial flutter ([place]) 02/22/2015   C. difficile colitis 10/10/2011   Chronic diarrhea    Chronic pain    Depression    ESRD on hemodialysis [facility])    Dialysis [name]   GERD (gastroesophageal reflux disease)    HIV (human immunodeficiency virus infection) ([place])    Hypertension    Insomnia    Intracranial hemorrhage (HCC)    Muscle weakness (generalized)    Pulmonary HTN ([place])    Tachycardia    TIA (transient ischemic attack)    [facility] do not have this dx   Traumatic hematoma of right upper arm 02/22/2015   Past Surgical History:  Past Surgical History:  Procedure Laterality Date   A/V FISTULAGRAM N/A 04/17/2016   Procedure: A/V Fistulagram - Right Upper;  Surgeon: Waynetta Sandy, MD;  Location: [place] [facility];  Service: Cardiovascular;  Laterality: N/A;   A/V FISTULAGRAM Right 08/28/2019   Procedure: A/V FISTULAGRAM;  Surgeon: Waynetta Sandy, MD;  Location: [place] [facility];  Service: Cardiovascular;  Laterality: Right;   ABDOMINAL HYSTERECTOMY     APPLICATION OF WOUND VAC Right 01/07/2021   Procedure: APPLICATION OF WOUND VAC;  Surgeon: Waynetta Sandy, MD;  Location: [place];  Service: Vascular;  Laterality: Right;   BIOPSY  01/02/2020   Procedure: BIOPSY;  Surgeon: Eloise Harman, DO;  Location: [facility];  Service: Endoscopy;;   BIOPSY  07/11/2020   Procedure: BIOPSY;  Surgeon: Irving Copas., MD;  Location: [place];  Service: Gastroenterology;;   BIOPSY THYROID     CARPAL  TUNNEL RELEASE Right 11/16/2019   Procedure: CARPAL TUNNEL RELEASE;  Surgeon: Leanora Cover, MD;  Location: [place];  Service: Orthopedics;  Laterality: Right;   COLONOSCOPY WITH PROPOFOL N/A 01/02/2020   Surgeon: Eloise Harman, DO;nonbleeding internal hemorrhoids, diverticulosis, status post biopsies of the entire colon (benign). Due for repeat in 2026.   Dialysis Shunts     previous one removed from left arm and now present one in right arm   DIALYSIS/PERMA CATHETER INSERTION Right 04/17/2016   Procedure: dialysis Catheter Insertion central veinous;  Surgeon: Waynetta Sandy, MD;  Location: [place] [facility];  Service: Cardiovascular;  Laterality: Right;   ESOPHAGOGASTRODUODENOSCOPY  12/15/2010   [place]k: erosive reflux esophagitis, MW tear, hiatal hernia (small), gastritis with no h.pylori, possibly nsaid related   ESOPHAGOGASTRODUODENOSCOPY (EGD) WITH PROPOFOL N/A 07/11/2020    Surgeon: Irving Copas., MD; normal esophagus, 4 cm hiatal hernia, gastritis biopsied (mild chronic gastritis, negative H. pylori), single duodenal polyp (Brunner's gland hyperplasia), s/p duodenal biopsy benign, duodenal narrowing at the duodenal sweep.   EUS N/A 07/11/2020    Surgeon: Irving Copas., MD;  dilated CBD and common hepatic duct without stones or sludge in the bile duct, stones and biliary sludge in the gallbladder, pancreatic parenchyma with hyperechoic strands, pancreatic duct dilated with prominent sidebranches in the neck and body and tail, no pancreatic mass though visulalization limited, no ampullary lesion, no malignant appearing lymph node   EXCISION OF BREAST  BIOPSY Right 05/04/2012   Procedure: EXCISION OF BREAST BIOPSY;  Surgeon: Donato Heinz, MD;  Location: [facility];  Service: General;  Laterality: Right;  Right Excisional Breast Biopsy   FISTULOGRAM Right 03/26/2014   Procedure: Right Arm Fistulogram with Venoplasty Right Subclavian Vein and Inominate Vein.  Debridement Fistula Ulcer;  Surgeon: Conrad Adena, MD;  Location: [place];  Service: Vascular;  Laterality: Right;   FISTULOGRAM Right 03/29/2017   Procedure: FISTULOGRAM COMPLEX RIGHT ARM with Balloon angioplasty;  Surgeon: Waynetta Sandy, MD;  Location: [place];  Service: Vascular;  Laterality: Right;   HEMOSTASIS CLIP PLACEMENT  07/11/2020   Procedure: HEMOSTASIS CLIP PLACEMENT;  Surgeon: Irving Copas., MD;  Location: [place];  Service: Gastroenterology;;   INCISION AND DRAINAGE Right 12/27/2020   Procedure: INCISION AND DRAINAGE OF RIGHT FOREARM;  Surgeon: Waynetta Sandy, MD;  Location: [place];  Service: Vascular;  Laterality: Right;   INCISION AND DRAINAGE Right 12/31/2020   Procedure: INCISION AND DRAINAGE RIGHT ARM WITH PLACEMNENT OF WOUND VAC;  Surgeon: Waynetta Sandy, MD;  Location: [place];  Service: Vascular;  Laterality: Right;   INCISION AND DRAINAGE Right 01/07/2021   Procedure: RIGHT ARM INCISION AND DRAINAGE;  Surgeon: Waynetta Sandy, MD;  Location: [place];  Service: Vascular;  Laterality: Right;   IR FLUORO GUIDE CV LINE LEFT  12/26/2020   IR FLUORO GUIDE CV LINE LEFT  12/30/2020   IR GENERIC HISTORICAL  05/19/2016   IR REMOVAL TUN CV CATH W/O FL 05/19/2016 Markus Daft, MD [facility]-INTERV RAD   IR US GUIDE VASC ACCESS LEFT  12/26/2020   IR US GUIDE VASC ACCESS LEFT  12/30/2020   LIGATION OF ARTERIOVENOUS  FISTULA Right 11/12/2020   Procedure: LIGATION OF ARTERIOVENOUS  FISTULA WITH EXCISION OF NECROTIC TISSUE;  Surgeon: Waynetta Sandy, MD;  Location: [place];  Service: Vascular;  Laterality: Right;   LIGATION OF ARTERIOVENOUS  FISTULA Right 12/27/2020   Procedure: LIGATION OF ARTERIOVENOUS  FISTULA;  Surgeon: Waynetta Sandy, MD;  Location: [place];  Service: Vascular;  Laterality: Right;   PERIPHERAL VASCULAR BALLOON ANGIOPLASTY Right 04/17/2016   Procedure: Peripheral Vascular Balloon Angioplasty;  Surgeon: Waynetta Sandy, MD;  Location: [place] [facility];  Service: Cardiovascular;  Laterality: Right;  Central segment AV Fistula   PERIPHERAL VASCULAR BALLOON ANGIOPLASTY Right 03/14/2018   Procedure: PERIPHERAL VASCULAR BALLOON ANGIOPLASTY;  Surgeon: Waynetta Sandy, MD;  Location: [place] [facility];  Service: Cardiovascular;  Laterality: Right;  subclavian vein   PERIPHERAL VASCULAR BALLOON ANGIOPLASTY Right 08/28/2019   Procedure: PERIPHERAL VASCULAR BALLOON ANGIOPLASTY;  Surgeon: Waynetta Sandy, MD;  Location: [place] [facility];  Service: Cardiovascular;  Laterality: Right;  upper arm   PERIPHERAL VASCULAR BALLOON ANGIOPLASTY Right 11/11/2020   Procedure: PERIPHERAL VASCULAR BALLOON ANGIOPLASTY;  Surgeon: Waynetta Sandy, MD;  Location: [place] [facility];  Service: Cardiovascular;  Laterality: Right;   PERIPHERAL VASCULAR INTERVENTION Right 03/14/2018   Procedure: PERIPHERAL VASCULAR INTERVENTION;  Surgeon: Waynetta Sandy, MD;  Location: [place] [facility];  Service: Cardiovascular;  Laterality: Right;  subclavian vein   POLYPECTOMY  07/11/2020   Procedure: POLYPECTOMY;  Surgeon: Mansouraty, Telford Nab., MD;  Location: [facility];  Service: Gastroenterology;;   REVISON OF ARTERIOVENOUS FISTULA Right 11/12/2020   Procedure: CREATION OF RIGHT ARM ARTERIOVENOUS FISTULA;  Surgeon: Waynetta Sandy, MD;  Location: [place];  Service: Vascular;  Laterality: Right;   SHUNTOGRAM Right 10/19/2013   Procedure:  FISTULOGRAM;  Surgeon: Conrad Seabeck, MD;  Location: [facility] C[facility] LAB;  Service: Cardiovascular;  Laterality: Right;   SKIN SPLIT GRAFT Right 01/07/2021   Procedure: MYRIAD SKIN GRAFT PLACEMENT;  Surgeon: Waynetta Sandy, MD;  Location: [place];  Service: Vascular;  Laterality: Right;   TONSILLECTOMY     VISCERAL VENOGRAPHY Right 03/14/2018   Procedure: CENTRAL VENOGRAPHY;  Surgeon: Waynetta Sandy, MD;  Location: [place] [facility];   Service: Cardiovascular;  Laterality: Right;  upper ext fistula   HPI: [name] is a 70 y.o. female with medical history significant for ESRD on HD TTS, GERD, TIA, HIV, atrial flutter on Eliquis, anemia of chronic disease, chronic pain. Admitted from hemodialysis with syncopal episode, altered mentation, elevated blood pressure readings and seizure activity overnight.  MRI no acute CVA and findings concerning for PRES; Cortical and subcortical edema in the occipital parietal lobes  bilaterally, right for slightly greater than left.  BSE 11/20/20 prolonged mastication, recommended Dys 2, thin and upgraded to regular several days later.   Subjective: Pt awake but very lethargic, requiring intermittent cues to remain awake.    Recommendations for follow up therapy are one component of a multi-disciplinary discharge planning process, led by the attending physician.  Recommendations may be updated based on patient status, additional functional criteria and insurance authorization.  Assessment / Plan / Recommendation  Clinical Impressions 01/28/2021  Clinical Impression Pt was seen for a FEES. Overall, pt exhibits severe oral dysphagia c/b significant lingual edema impeding bolus reception and preparation and severe pharyngeal dysphagia d/t timing deficit and suspected weakness preventing strong and timely airway closure. Pt awake but requiring intermittent cues to remain awake before test. SLP inserted scope and noted large amount of clear standing secretions in vallecula, pyriform sinuses, interarytenoid space, and laryngeal vestibule, not clearing throughout session and gargling upon pt phonation. SLP trialed puree from spoon, however pt exhibited difficulty receiving and moving bolus d/t significant lingual edema, resutling in lingual pumping and anterior spillage. SLP trialed thin liquid from straw with improving oral phase, however pt exhibited aspiration of thin liquids with attempt to clear (PAS  7). SLP retrialed puree, resulting in oral holding, lingual pumping, and eventual removal with suction. SLP will plan to follow with MBS when lingual edema decreases and pt alertness increases to objectively view swallow across all consistencies.  SLP Visit Diagnosis Dysphagia, oropharyngeal phase (R13.12)  Attention and concentration deficit following --  Frontal lobe and executive function deficit following --  Impact on safety and function Severe aspiration risk      Treatment Recommendations 01/28/2021  Treatment Recommendations Therapy as outlined in treatment plan below;F/U MBS in --- days (Comment)     Prognosis 01/28/2021  Prognosis for Safe Diet Advancement Fair  Barriers to Reach Goals Severity of deficits  Barriers/Prognosis Comment --    Diet Recommendations 01/28/2021  SLP Diet Recommendations NPO  Liquid Administration via --  Medication Administration Via alternative means  Compensations --  Postural Changes --      Other Recommendations 01/28/2021  Recommended Consults --  Oral Care Recommendations Oral care QID  Other Recommendations --  Follow Up Recommendations Skilled nursing-short term rehab (<3 hours/day)  Assistance recommended at discharge Frequent or constant Supervision/Assistance  Functional Status Assessment Patient has had a recent decline in their functional status and demonstrates the ability to make significant improvements in function in a reasonable and predictable amount of time.    Frequency and Duration  01/28/2021  Speech Therapy Frequency (  ACUTE ONLY) min 2x/week  Treatment Duration 2 weeks      Oral Phase 01/28/2021  Oral Phase Impaired  Oral - Pudding Teaspoon --  Oral - Pudding Cup --  Oral - Honey Teaspoon --  Oral - Honey Cup --  Oral - Nectar Teaspoon --  Oral - Nectar Cup --  Oral - Nectar Straw --  Oral - Thin Teaspoon --  Oral - Thin Cup --  Oral - Thin Straw Weak lingual manipulation;Lingual pumping;Reduced posterior  propulsion;Delayed oral transit  Oral - Puree Lingual pumping;Weak lingual manipulation;Delayed oral transit;Decreased bolus cohesion;Right anterior bolus loss;Left anterior bolus loss;Holding of bolus  Oral - Mech Soft --  Oral - Regular --  Oral - Multi-Consistency --  Oral - Pill --  Oral Phase - Comment --    Pharyngeal Phase 01/28/2021  Pharyngeal Phase Impaired  Pharyngeal- Pudding Teaspoon --  Pharyngeal --  Pharyngeal- Pudding Cup --  Pharyngeal --  Pharyngeal- Honey Teaspoon --  Pharyngeal --  Pharyngeal- Honey Cup --  Pharyngeal --  Pharyngeal- Nectar Teaspoon --  Pharyngeal --  Pharyngeal- Nectar Cup --  Pharyngeal --  Pharyngeal- Nectar Straw --  Pharyngeal --  Pharyngeal- Thin Teaspoon --  Pharyngeal --  Pharyngeal- Thin Cup --  Pharyngeal --  Pharyngeal- Thin Straw Penetration/Aspiration during swallow;Delayed swallow initiation-pyriform sinuses;Penetration/Aspiration before swallow;Pharyngeal residue - valleculae  Pharyngeal Material enters airway, passes BELOW cords and not ejected out despite cough attempt by patient  Pharyngeal- Puree --  Pharyngeal --  Pharyngeal- Mechanical Soft --  Pharyngeal --  Pharyngeal- Regular --  Pharyngeal --  Pharyngeal- Multi-consistency --  Pharyngeal --  Pharyngeal- Pill --  Pharyngeal --  Pharyngeal Comment --     No flowsheet data found.  Written by SLP student Ashely Dolores Patty 01/28/2021, 1:18 PM

## 2021-01-28 NOTE — Procedures (Addendum)
Patient Name: [name]  MRN: [id number]  Epilepsy Attending: Lora Havens  Referring Physician/Provider: Dr Donnetta Simpers Duration: 01/28/2021 0100 to 0941   Patient history: 70 year old female who presented with altered mental status, fever and seizure.  EEG to evaluate for seizure.   Level of alertness:  lethargic    AEDs during EEG study: LEV, LCM   Technical aspects: This EEG study was done with scalp electrodes positioned according to the 10-20 International system of electrode placement. Electrical activity was acquired at a sampling rate of 500Hz  and reviewed with a high frequency filter of 70Hz  and a low frequency filter of 1Hz . EEG data were recorded continuously and digitally stored.    Description: No posterior dominant rhythm was seen.  EEG showed continuous generalized polymorphic mixed frequencies with predominantly 3 to 6 Hz theta-delta slowing as well as 15 to 18 Hz beta activity in frontocentral region. Hyperventilation and photic stimulation were not performed.     ABNORMALITY - Continuous slow, generalized   IMPRESSION: This study is suggestive of moderate diffuse encephalopathy, nonspecific etiology.  No seizures or definite epileptiform discharges were seen during this time.   EEG continues to be improving compared to previous day.   Godfrey Tritschler [name]

## 2021-01-28 NOTE — Progress Notes (Signed)
[facility] NEPHROLOGY PROGRESS NOTE  Assessment/ Plan:  Outpatient HD orders:[name] TTS,EDW 41.5 kg,[place] 2K/2.5 ca,BF 350  DF 500,Time - 180 minutes Access catheter  Medications - out of hectorol and now on calcitriol - 1.25 mcg each tx. Sensipar 90 mg three times a week; Epogen 3600 units each tx, Heparin 1000 units bolus then 500 units per hour Has been on vanc 500 mg each tx (though misses often)   # Acute encephalopathy: EEG with moderate diffuse encephalopathy of nonspecific etiology.  Based on neurology evaluation appears to be consistent with PRES along with imaging showing no evidence of acute CVA.  Per neurology.  # ESRD: She is on an outpatient TTS schedule, plan for dialysis today.  Left IJ TDC for the access.  # Anemia: Without overt blood loss, continue ESA with HD.  # CKD-MBD: Resumed  calcitriol and Sensipar for PTH control.  Hyperphosphatemia noted-start calcium acetate as she is on tube feeding.    #  Right radiocephalic fistula infection: Status post ligation and debridement on 10/28 with inconsistent outpatient antibiotic therapy due to missing hemodialysis or signing off early.  Restarted back on antibiotic therapy with vancomycin.  # Hypertension: Blood pressures under decent control at this time, will continue to follow closely status post hemodialysis/UF.  Subjective: Seen and examined at bedside.  More somnolent today.  Currently has NG tube for feeding.  No new event.  Plan for dialysis today. Objective Vital signs in last 24 hours: Vitals:   01/28/21 0600 01/28/21 0615 01/28/21 0700 01/28/21 0732  BP: (!) 153/83 (!) 134/98 131/74 121/80  Pulse: (!) 206 91 81 74  Resp: (!) 26 20 (!) 23 (!) 22  Temp:    98.8 F (37.1 C)  TempSrc:    Oral  SpO2: 98% 98% 96% 97%  Weight:       Weight change:   Intake/Output Summary (Last 24 hours) at 01/28/2021 1044 Last data filed at 01/28/2021 0600 Gross per 24 hour  Intake 1390.44 ml  Output --   Net 1390.44 ml        Labs: Basic Metabolic Panel: Recent Labs  Lab 01/26/21 0332 01/27/21 0443 01/28/21 0331  NA 139 132* 131*  K 4.2 4.2 4.4  CL 103 97* 95*  CO2 21* 21* 19*  GLUCOSE 82 77 81  BUN 36* 16 21  CREATININE 8.38* 5.49* 7.06*  CALCIUM 8.2* 8.7* 8.7*  PHOS 5.7* 6.3* 8.0*    Liver Function Tests: Recent Labs  Lab 01/23/21 1016 01/24/21 0134 01/24/21 1440 01/25/21 0209 01/26/21 0332 01/27/21 0443 01/28/21 0331  AST 17 27 20   --   --   --   --   ALT 10 11 11   --   --   --   --   ALKPHOS 87 95 84  --   --   --   --   BILITOT 0.9 0.9 0.5  --   --   --   --   PROT 7.5 8.4* 8.0  --   --   --   --   ALBUMIN 3.0* 3.3* 3.2*   < > 2.8* 2.7* 2.6*   < > = values in this interval not displayed.    No results for input(s): LIPASE, AMYLASE in the last 168 hours. Recent Labs  Lab 01/23/21 1501 01/24/21 0134  AMMONIA 25 65*    CBC: Recent Labs  Lab 01/23/21 1016 01/24/21 0134 01/24/21 1331 01/25/21 3382 01/26/21 0331 01/27/21 0443 01/28/21 0331  WBC 3.2*   < > 4.7 3.8* 5.1 5.1 4.7  NEUTROABS 2.4  --  3.8  --   --   --   --   HGB 10.1*   < > 10.2* 8.7* 9.7* 10.9* 9.2*  HCT 34.4*   < > 34.0* 28.8* 31.9* 34.3* 29.6*  MCV 104.2*   < > 104.3* 102.1* 100.9* 97.4 96.7  PLT 147*   < > 143* 127* 139* 165 134*   < > = values in this interval not displayed.    Cardiac Enzymes: No results for input(s): CKTOTAL, CKMB, CKMBINDEX, TROPONINI in the last 168 hours. CBG: Recent Labs  Lab 01/23/21 1019 01/24/21 0124 01/24/21 1131  GLUCAP 73 127* 126*     Iron Studies: No results for input(s): IRON, TIBC, TRANSFERRIN, FERRITIN in the last 72 hours. Studies/Results: DG Abd Portable 1V  Result Date: 01/27/2021 CLINICAL DATA:  Post feeding tube placement. EXAM: PORTABLE ABDOMEN - 1 VIEW COMPARISON:  CT abdomen and pelvis 10/01/2019. FINDINGS: Feeding tube tip is at the level of the gastric antrum. No dilated bowel loops visualized. No acute fractures.  IMPRESSION: Feeding tube tip at the level of the gastric antrum. Electronically Signed   By: Ronney Asters M.D.   On: 01/27/2021 15:40    Medications: Infusions:  sodium chloride Stopped (01/25/21 1641)   sodium chloride     sodium chloride     sodium chloride     sodium chloride     clevidipine 10 mg/hr (01/28/21 1033)   heparin 850 Units/hr (01/28/21 0600)   lacosamide (VIMPAT) IV 150 mg (01/28/21 0935)   levETIRAcetam     levETIRAcetam 400 mL/hr at 01/28/21 0600    Scheduled Medications:  calcitRIOL  1.25 mcg Per Tube Q T,Th,Sa-HD   chlorhexidine  15 mL Mouth Rinse BID   Chlorhexidine Gluconate Cloth  6 each Topical Q0600   cinacalcet  90 mg Oral Q T,Th,Sa-HD   clopidogrel  75 mg Per Tube Q breakfast   collagenase  1 application Topical Daily   darbepoetin (ARANESP) injection - DIALYSIS  40 mcg Intravenous Q Tue-HD   dolutegravir  50 mg Per Tube q1600   lamiVUDine  50 mg Per Tube Daily   lidocaine-prilocaine  1 application Topical Q T,Th,Sa-HD   mouth rinse  15 mL Mouth Rinse q12n4p   metoprolol tartrate  25 mg Per Tube BID   mirtazapine  15 mg Per Tube QHS   pantoprazole (PROTONIX) IV  40 mg Intravenous QHS   polyethylene glycol  17 g Oral Daily   senna  2 tablet Per Tube QPM   sodium chloride flush  3 mL Intravenous Q12H   thiamine injection  100 mg Intravenous Daily   vancomycin  500 mg Intravenous Q T,Th,Sa-HD   venlafaxine  75 mg Per Tube BID   Zidovudine  100 mg Per Tube Q8H    have reviewed scheduled and prn medications.  Physical Exam: General: Ill-looking female with NG tube feeding.  Alert  but somnolent. Heart:RRR, s1s2 nl Lungs:clear b/l, no crackle Abdomen:soft, Non-tender, non-distended Extremities:No edema Dialysis Access: Left IJ TDC in place.  Daphane Odekirk Prasad Hisako Bugh 01/28/2021,10:44 AM  LOS: 5 days

## 2021-01-28 NOTE — Progress Notes (Addendum)
Initial Nutrition Assessment  DOCUMENTATION CODES:   Severe malnutrition in context of chronic illness, Underweight  INTERVENTION:  - Initiate tube feeding via Cortrak: Start Osmolite 1.2 at 89ml/h and advance by 50ml every 6 hours until goal rate of 80ml is reached (1258ml per day)-Tube feeding adjusted for Tivicay administration Prosource TF 45 ml once daily  Provides 1552 kcal, 81 gm protein, 1033 ml free water daily  - Monitor magnesium, potassium, and phosphorus BID for at least 3 days, MD to replete as needed, as pt is at risk for refeeding syndrome given severe malnutrition and extended NPO status  NUTRITION DIAGNOSIS:   Severe Malnutrition related to chronic illness (ESRD) as evidenced by severe fat depletion, severe muscle depletion.   GOAL:   Patient will meet greater than or equal to 90% of their needs  MONITOR:   Diet advancement, Labs, Weight trends, Skin, I & O's  REASON FOR ASSESSMENT:   Consult Enteral/tube feeding initiation and management  ASSESSMENT:   Pt admitted to [facility] ED with syncope during dialysis on 11/24. On 11/25, pt had a seizure with noted hypertension. 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 PMH includes ESRD on HD (TTS), atrial flutter, chronic pain, depression, GERD, HIV, HTN, infection of fistula, and HTN.  11/28: Cortrak placed xray confirmed in stomach 11/29: FEES planned for today; Noted plans for HD today  Pt is a limited historian. Per review of chart, pt is DNR and initially refused tube feedings however agreed to Cortrak for short term nutrition. Spoke with RN who states that when pt is awake, she is alert and may tolerate PO intake. She also states that when pt given medication through tube, she would cough and throw up. Cortrak placed yesterday, will make recommendations for tube feedings however pt is at risk for refeeding given NPO status and severe malnutrition, plan to initiate slow  and monitor for tolerance as well as possible diet advancement.  Admit weight: 43.4 kg  Medications: calcitriol, cinacalcet, tivicay, epivir, protonix, miralax, senna, thiamine, IV abx, retrovir, cleviprex  Labs: sodium 131, Cr 7.06, Phosphorus 8.0  UOP: 49mL I/O's: +3833mL since admission  NUTRITION - FOCUSED PHYSICAL EXAM:  Flowsheet Row Most Recent Value  Orbital Region Severe depletion  Upper Arm Region Severe depletion  Thoracic and Lumbar Region Severe depletion  Buccal Region Severe depletion  Temple Region Severe depletion  Clavicle Bone Region Severe depletion  Clavicle and Acromion Bone Region Severe depletion  Scapular Bone Region Severe depletion  Dorsal Hand Severe depletion  Patellar Region Severe depletion  Anterior Thigh Region Severe depletion  Posterior Calf Region Severe depletion  Edema (RD Assessment) None  Hair Reviewed  Eyes Reviewed  Mouth Other (Comment)  [white coating and enlarged tongue]  Skin Reviewed  Nails Reviewed       Diet Order:   Diet Order             Diet NPO time specified Except for: Sips with Meds  Diet effective now                   EDUCATION NEEDS:   Not appropriate for education at this time  Skin:  Skin Assessment: Skin Integrity Issues: Skin Integrity Issues:: Incisions Incisions: R arm  Last BM:  01/28/21  Height:   Ht Readings from Last 1 Encounters:  01/22/21 5' 2.5" (1.588 m)    Weight:   Wt Readings from Last 1 Encounters:  01/23/21 43.4 kg   BMI:  Body mass index is 17.22 kg/m.  Estimated Nutritional Needs:   Kcal:  1500-1700  Protein:  75-85g  Fluid:  1L+UOP  Clayborne Dana, RDN, LDN Clinical Nutrition

## 2021-01-28 NOTE — Progress Notes (Signed)
Pt receives out-pt HD at [facility] on TTS. Pt arrives at 11:00 for 11:15 chair time. Will assist as needed.  Melven Sartorius Renal Navigator [phone]

## 2021-01-28 NOTE — Progress Notes (Signed)
LTM EEG discontinued - no skin breakdown at unhook.   

## 2021-01-28 NOTE — Progress Notes (Signed)
NAME:  SOMMER SPICKARD, MRN:  [id number], DOB:  1950-08-19, LOS: 5 ADMISSION DATE:  01/23/2021, CONSULTATION DATE:  01/24/2021 REFERRING MD:  Manuella Ghazi , CHIEF COMPLAINT:  Syncope   History of Present Illness:  70 year old woman who presented to [facility] ED 11/24 with syncope. 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  While at HD 11/24, patient experienced syncopal event while on HD (s/p removal of 500cc fluid in HD) as well as intermittent confusion. CT Head negative for acute process.  Patient was subsequently admitted to the hospitalist service. On 11/25, patient became hypertensive, had a seizure and Neuro was consulted. MRI concerning for PRES. The patient was started on Cardene gtt for BP control. Transferred to [facility] for LTM EEG.  PCCM was consulted for assistance with management.  Pertinent Medical History:  ESRD on HD (TTS), a flutter, chronic pain, depression, GERD, HIV, HTN, infection of fistula on vanc treatment  Significant Hospital Events: Including procedures, antibiotic start and stop dates in addition to other pertinent events   11/24 Presented with syncope 11/25 Seizure, CTH neg, MRI concern for PRES, request for transfer to [facility]. 11/26 Arrival to [facility] 11/28 Improved mental status, LTM EEG with GPDs/triphasic morphology, low potential for seizure recurrence - better compared to prior. Remains on Cleviprex. Failed bedside eval with speech, will need FEES. Cortrak placed. 11/29 Stable mental status/neuro exam. D/c LTM per Neuro. Volume up on exam, HD today. FEES today.  Interim History / Subjective:  Slightly more drowsy this morning, but interacting appropriately Did not sleep well overnight Cortrak placed yesterday, patient has been gagging as a result of this - occasional emesis Some concern for ?aspiration, will obtain CXR Crackles on exam today, due for  HD Remains on Cleviprex at 8/hr FEES today per SLP  Objective:  Blood pressure 121/80, pulse 74, temperature 98.8 F (37.1 C), temperature source Oral, resp. rate (!) 22, weight 43.4 kg, SpO2 97 %.        Intake/Output Summary (Last 24 hours) at 01/28/2021 0902 Last data filed at 01/28/2021 0600 Gross per 24 hour  Intake 1390.44 ml  Output --  Net 1390.44 ml    Filed Weights   01/23/21 1830  Weight: 43.4 kg   Physical Examination: General: Chronically ill-appearing middle-aged woman in NAD. HEENT: [place]/AT, anicteric sclera, PERRL, moist mucous membranes. Edentulous. Neuro: Awake, oriented x 4. Dysarthric but answers questions appropriately. Responds to verbal stimuli. Following commands consistently. Moves all 4 extremities spontaneously. CV: Irregularly irregular rhythm, rate controlled, no m/g/r. PULM: Breathing even and unlabored on 4L [place]. Lung fields with bibasilar crackles. GI: Soft, nontender, mildly distended. Normoactive bowel sounds. Extremities: Trace BLE edema noted. RUE AVF with mild erythema, prior I&D/skin grafting, Kerlix gauze dressing in place. Skin: Warm/dry, no rashes.  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 Findings consistent with PRES. LP completed 11/26, acute meningitis/encephalitis ruled out. EEG confirmed nonconvulsive status epilepticus, which improved after Vimpat. - Neurology/Epilepsy following, appreciate assistance - AEDs per Neuro, continue Keppra/Vimpat - Discontinue LTM EEG today, 11/29 - SBP goal < 140 - Cleviprex titrated to SBP goal - Additional PO/VT medications to allow for Cleviprex weaning  Paroxysmal atrial fibrillation with rapid ventricular response Patient went into A. fib with RVR 11/26 with heart  rate 130s. On Eliquis at  home. - S/p amiodarone bolus + infusion - Rate controlled - Ongoing cardiac monitoring - Heparin gtt, transition back to Eliquis before d/c  MRSA infection of right AV fistula Sp I&D, skin grafting. Was receiving treatment with vanc with outpatient dialysis. - Continue Vanc with HD - Pharmacy following for vanc dosing, appreciate assistance - WOC consult appreciated  ESRD on HD (TTS) - Nephrology following, appreciate assistance - HD via [facility], next today 11/29 - Volume up on exam today - would benefit from HD - Trend BMP - Replete electrolytes as indicated - Monitor I&Os - Avoid nephrotoxic agents as able - Ensure adequate renal perfusion  HIV CD4 252 on 11/20/20 with undetected viral load. - Continue antiretrovirals (zidovudine, lamivudine, dolutegravir)  Anemia of chronic disease, macrocytic Thrombocytopenia H&H stable, no signs of active bleeding. B12 levels normal. - Trend H&H - Transfuse for Hgb < 7.0  Severe protein calorie malnutrition Hypoalbuminemia - Continue dietary supplements as able - Appreciate SLP evaluation/assistance - FEES today, 11/29 - Continue Cortrak for now - Repeat KUB to assess tube position, given significant gagging/coughing  GERD - PPI  GOC Palliative care consulted previous admission. 01/06/21 PC discussion with patient states DNR. Limited Additional Interventions: Use medical treatment, IV fluids and cardiac monitoring as indicated, DO NOT USE intubation or mechanical ventilation. May consider use of less invasive airway support such as BiPAP or CPAP. Also provide comfort measures. Transfer to the hospital if indicated. - Readdressed GOC/patient wishes 11/28 in context of temporary feeding tube, ok for temporary tube  Best Practice (right click and "Reselect all SmartList Selections" daily)   Diet/type: NPO w/ oral meds, SLP eval recommending NPO, FEES 11/29 DVT prophylaxis: SCD GI prophylaxis: PPI Lines: Dialysis Catheter Foley:   N/A Code Status:  DNR/DNI - See PC note on 11/7 Last date of multidisciplinary goals of care discussion [11/28, patient's daughter was updated over the phone]  Critical care time: 31 minutes   Lestine Mount, PA-C [facility] 01/28/21 9:02 AM  Please see [email or web address] for pager details.  From 7A-7P if no response, please call [phone] After hours, please call ELink [phone]

## 2021-01-28 NOTE — Progress Notes (Signed)
ANTICOAGULATION & Antibiotic CONSULT NOTE - Follow up [place] for heparin; vancomycin  Indication:  Aflutter ; MRSA infection of AV fistula   Allergies  Allergen Reactions   Lactose Intolerance (Gi) Other (See Comments)    On MAR   Latex Rash and Itching   Penicillins Rash and Swelling    Has patient had a PCN reaction causing immediate rash, facial/tongue/throat swelling, SOB or lightheadedness with hypotension: No Has patient had a PCN reaction causing severe rash involving mucus membranes or skin necrosis: No Has patient had a PCN reaction that required hospitalization No Has patient had a PCN reaction occurring within the last 10 years: No If all of the above answers are "NO", then may proceed with Cephalosporin use.      Patient Measurements: Weight: 43.4 kg (95 lb 10.9 oz)  Vital Signs: Temp: 99.1 F (37.3 C) (11/29 0400) Temp Source: Oral (11/29 0400) BP: 134/98 (11/29 0615) Pulse Rate: 91 (11/29 0615)  Labs: Recent Labs    01/26/21 0331 01/26/21 0332 01/27/21 0443 01/28/21 0331  HGB 9.7*  --  10.9* 9.2*  HCT 31.9*  --  34.3* 29.6*  PLT 139*  --  165 134*  APTT 108*  --   --   --   HEPARINUNFRC  --  0.60 0.37 0.34  CREATININE  --  8.38* 5.49* 7.06*     Estimated Creatinine Clearance: 5.3 mL/min (A) (by C-G formula based on SCr of 7.06 mg/dL (H)).   Medical History: Past Medical History:  Diagnosis Date   Anemia    Anxiety    Atrial flutter ([place]) 02/22/2015   C. difficile colitis 10/10/2011   Chronic diarrhea    Chronic pain    Depression    ESRD on hemodialysis [facility])    Dialysis [name]   GERD (gastroesophageal reflux disease)    HIV (human immunodeficiency virus infection) ([place])    Hypertension    Insomnia    Intracranial hemorrhage (HCC)    Muscle weakness (generalized)    Pulmonary HTN (HCC)    Tachycardia    TIA (transient ischemic attack)    [facility] do not have this dx   Traumatic hematoma  of right upper arm 02/22/2015    Medications:  Medications Prior to Admission  Medication Sig Dispense Refill Last Dose   Amino Acids-Protein Hydrolys (PRO-STAT 64 PO) Take by mouth. Give 59ml by mouth in the morning for wound healing   01/23/2021   apixaban (ELIQUIS) 2.5 MG TABS tablet Take 1 tablet (2.5 mg total) by mouth 2 (two) times daily. 60 tablet 0 01/23/2021 at 0800   baclofen (LIORESAL) 10 MG tablet Take 10 mg by mouth daily as needed for muscle spasms.   Past Month   clopidogrel (PLAVIX) 75 MG tablet Take 1 tablet (75 mg total) by mouth daily with breakfast. 30 tablet  Past Month   collagenase (SANTYL) ointment Apply 1 application topically daily. Apply to sacrum topically every day shift for wound healing      cyanocobalamin (,VITAMIN B-12,) 1000 MCG/ML injection Inject 1,000 mcg into the muscle every 30 (thirty) days. On the 28th of every month   Past Month   dolutegravir (TIVICAY) 50 MG tablet Take 50 mg by mouth daily at 4 PM.   Past Week   Emollient (MINERIN) LOTN Apply 1 application topically every 12 (twelve) hours as needed (dry skin). Apply to feet and ankles      fentaNYL (DURAGESIC) 25 MCG/HR Place 1  patch onto the skin every 3 (three) days. 3 patch 0 Past Week   lamivudine (EPIVIR) 100 MG tablet Take 50 mg by mouth daily. (0800)   01/23/2021   Lidocaine HCl 4 % CREA Apply 1 application topically every 12 (twelve) hours as needed (pain).      lidocaine-prilocaine (EMLA) cream Apply 1 application topically See admin instructions. (0900) Apply to fistula site one hour before dialysis on Tuesday, Thursday, and Saturday.   Past Month   megestrol (MEGACE) 400 MG/10ML suspension Take 400 mg by mouth daily.   01/23/2021   melatonin 3 MG TABS tablet Take 3 mg by mouth at bedtime. (2000)   Past Week   methadone (DOLOPHINE) 5 MG tablet Take 0.5 tablets (2.5 mg total) by mouth daily at 8 pm. (2000) 3 tablet 0 Past Week   metoprolol tartrate (LOPRESSOR) 25 MG tablet Take 1 tablet (25  mg total) by mouth 2 (two) times daily. 60 tablet 0 01/23/2021 at 0800   mirtazapine (REMERON) 15 MG tablet Take 15 mg by mouth at bedtime.   Past Month   nitroGLYCERIN (NITROSTAT) 0.4 MG SL tablet Place 0.4 mg under the tongue every 5 (five) minutes x 3 doses as needed for chest pain.    unk at unk   ondansetron (ZOFRAN ODT) 8 MG disintegrating tablet Take 1 tablet (8 mg total) by mouth every 8 (eight) hours as needed for nausea or vomiting. 30 tablet 3 Past Month   Oxycodone HCl 10 MG TABS Take 1 tablet (10 mg total) by mouth every 12 (twelve) hours as needed (severe pain). 4 tablet 0 Past Week   pantoprazole (PROTONIX) 40 MG tablet Take 1 tablet (40 mg total) by mouth daily before breakfast. 30 tablet 3 01/23/2021   promethazine (PHENERGAN) 12.5 MG tablet Take 12.5 mg by mouth See admin instructions. Take 1 tablet (12.5 mg) by mouth in the morning every Tuesday, Thursday and Saturday for nausea/vomiting and every 6 hours as needed for n/v   01/23/2021   senna (SENOKOT) 8.6 MG tablet Take 2 tablets by mouth every evening. (2100)   Past Week   vancomycin (VANCOCIN) 500-5 MG/100ML-% IVPB Inject 100 mLs (500 mg total) into the vein Every Tuesday,Thursday,and Saturday with dialysis. 100 mL 0 01/23/2021   venlafaxine XR (EFFEXOR-XR) 150 MG 24 hr capsule Take 150 mg by mouth daily with breakfast. (0900)   01/23/2021   vitamin C (ASCORBIC ACID) 500 MG tablet Take 500 mg by mouth 2 (two) times daily.   01/23/2021   zidovudine (RETROVIR) 100 MG capsule Take 100 mg by mouth 3 (three) times daily. (0900, 1300, & 2100)   01/23/2021   Zinc Sulfate (ZINC-220 PO) Take 1 tablet by mouth daily.   01/23/2021   Multiple Vitamin (MULTIVITAMIN WITH MINERALS) TABS tablet Take 1 tablet by mouth at bedtime. (2000)      Scheduled:   calcitRIOL  1.25 mcg Per Tube Q T,Th,Sa-HD   chlorhexidine  15 mL Mouth Rinse BID   Chlorhexidine Gluconate Cloth  6 each Topical Q0600   cinacalcet  90 mg Oral Q T,Th,Sa-HD   clopidogrel   75 mg Per Tube Q breakfast   collagenase  1 application Topical Daily   darbepoetin (ARANESP) injection - DIALYSIS  40 mcg Intravenous Q Tue-HD   dolutegravir  50 mg Per Tube q1600   lamiVUDine  50 mg Per Tube Daily   lidocaine-prilocaine  1 application Topical Q T,Th,Sa-HD   mouth rinse  15 mL Mouth Rinse q12n4p  pantoprazole (PROTONIX) IV  40 mg Intravenous QHS   senna  2 tablet Per Tube QPM   sodium chloride flush  3 mL Intravenous Q12H   thiamine injection  100 mg Intravenous Daily   vancomycin  500 mg Intravenous Q T,Th,Sa-HD   Zidovudine  100 mg Per Tube Q8H   Infusions:   sodium chloride Stopped (01/25/21 1641)   sodium chloride     sodium chloride     sodium chloride     sodium chloride     amiodarone 30 mg/hr (01/28/21 0600)   clevidipine 10 mg/hr (01/28/21 0650)   heparin 850 Units/hr (01/28/21 0600)   lacosamide (VIMPAT) IV Stopped (01/27/21 2212)   levETIRAcetam     levETIRAcetam 400 mL/hr at 01/28/21 0600    Assessment: AC: 70yo W with hypertensive emergency and PRES on apixaban PTA for aflutter, now held. Last dose of Eliquis was taken 11/24 Pharmacy consulted for heparin   Currently on IV heparin at 850 units/hr. Anti-Xa level this AM is therapeutic at 0.34. H/H down to 9.2 and plts down to 134. Plt normalized. No overt s/s of bleeding per RN.   ID: Admitted on 01/23/2021 with MRSA infection of right AV fistula on vancomycin PTA with plan to continue to 12/14 (started 10/28, for 6 weeks).  Patient gets dialysis TTSa but has received inconsistent outpatient antibiotic therapy due to missing hemodialysis or ending session early.  Pharmacy has been consulted for vancomycin dosing.  A vancomycin random on 11/28 is therapeutic at 18 on vancomycin 500 mg with TTSa HD   Antimicrobials this admission: Vancomycin 10/28  >> 12/14     Microbiology results: 11/26 BCx: ngtd 11/26 MRSA PCR: neg  10/28 Arm abscess + MRSA   Goal of Therapy:  Heparin level 0.3-0.7  units/ml aPTT 66-102 seconds Monitor platelets by anticoagulation protocol: Yes   Plan:  Continue heparin infusion at 850 units/hr  Monitor daily aPTT, HL, CBC/plt Monitor for signs/symptoms of bleeding  F/u restart apixaban eventually  Continue Vancomycin 500mg  Post-HD on TTSa F/u HD schedule, adjust doses as needed F/u Weekly vancomycin level on Mondays- next 12/5 End date 12/14   Cathrine Muster, PharmD, BCPS PGY2 Cardiology Pharmacy Resident 01/28/2021  7:22 AM  Please check [email or web address] for unit-specific pharmacy phone numbers.

## 2021-01-28 NOTE — Progress Notes (Signed)
This chaplain responded to PMT consult for spiritual care. This Pt. is familiar to the chaplain from the Pt. previous admission and spiritual care visits.  The chaplain is updated by the Pt. RN-Connie before the visit.  The chaplain offered pastoral presence. The Pt. is awake and able to respond to the chaplain's conversation with a series of nods. The Pt. nods and acknowledges her faith and her relationship with her daughter-[name] and aunt-Claudette.  Both talking points from the Pt. previous spiritual care visits.  The chaplain ends today's visit with prayer and the Pt. acceptance of F/U spiritual care.   Chaplain Sallyanne Kuster [phone]

## 2021-01-29 ENCOUNTER — Ambulatory Visit: Payer: Medicare Other

## 2021-01-29 DIAGNOSIS — I6783 Posterior reversible encephalopathy syndrome: Secondary | ICD-10-CM | POA: Diagnosis not present

## 2021-01-29 LAB — CBC
HCT: 31.1 % — ABNORMAL LOW (ref 36.0–46.0)
Hemoglobin: 9.7 g/dL — ABNORMAL LOW (ref 12.0–15.0)
MCH: 30 pg (ref 26.0–34.0)
MCHC: 31.2 g/dL (ref 30.0–36.0)
MCV: 96.3 fL (ref 80.0–100.0)
Platelets: 122 10*3/uL — ABNORMAL LOW (ref 150–400)
RBC: 3.23 MIL/uL — ABNORMAL LOW (ref 3.87–5.11)
RDW: 17.6 % — ABNORMAL HIGH (ref 11.5–15.5)
WBC: 5.3 10*3/uL (ref 4.0–10.5)
nRBC: 0 % (ref 0.0–0.2)

## 2021-01-29 LAB — CSF CULTURE W GRAM STAIN: Culture: NO GROWTH

## 2021-01-29 LAB — CULTURE, BLOOD (ROUTINE X 2)
Culture: NO GROWTH
Culture: NO GROWTH

## 2021-01-29 LAB — GLUCOSE, CAPILLARY
Glucose-Capillary: 111 mg/dL — ABNORMAL HIGH (ref 70–99)
Glucose-Capillary: 113 mg/dL — ABNORMAL HIGH (ref 70–99)
Glucose-Capillary: 112 mg/dL — ABNORMAL HIGH (ref 70–99)
Glucose-Capillary: 99 mg/dL (ref 70–99)
Glucose-Capillary: 89 mg/dL (ref 70–99)
Glucose-Capillary: 85 mg/dL (ref 70–99)

## 2021-01-29 LAB — PHOSPHORUS
Phosphorus: 3.9 mg/dL (ref 2.5–4.6)
Phosphorus: 3.7 mg/dL (ref 2.5–4.6)

## 2021-01-29 LAB — MAGNESIUM
Magnesium: 1.7 mg/dL (ref 1.7–2.4)
Magnesium: 1.8 mg/dL (ref 1.7–2.4)

## 2021-01-29 LAB — HEPARIN LEVEL (UNFRACTIONATED): Heparin Unfractionated: 0.34 IU/mL (ref 0.30–0.70)

## 2021-01-29 LAB — CYTOLOGY - NON PAP

## 2021-01-29 MED ORDER — CHLORHEXIDINE GLUCONATE CLOTH 2 % EX PADS
6.0000 | MEDICATED_PAD | Freq: Every day | CUTANEOUS | Status: DC
  Administered 2021-01-29 – 2021-01-31 (×3): 6 via TOPICAL

## 2021-01-29 NOTE — Progress Notes (Signed)
50 mg of IV lacosamide was wasted in the Main Pharmacy's Stericycle container. Kelvin Cellar, RPh, was witness.

## 2021-01-29 NOTE — Progress Notes (Signed)
ANTICOAGULATION & Antibiotic CONSULT NOTE - Follow up [place] for heparin; vancomycin  Indication:  Aflutter ; MRSA infection of AV fistula   Allergies  Allergen Reactions   Lactose Intolerance (Gi) Other (See Comments)    On MAR   Latex Rash and Itching   Penicillins Rash and Swelling    Has patient had a PCN reaction causing immediate rash, facial/tongue/throat swelling, SOB or lightheadedness with hypotension: No Has patient had a PCN reaction causing severe rash involving mucus membranes or skin necrosis: No Has patient had a PCN reaction that required hospitalization No Has patient had a PCN reaction occurring within the last 10 years: No If all of the above answers are "NO", then may proceed with Cephalosporin use.      Patient Measurements: Weight: 42.4 kg (93 lb 7.6 oz)  Vital Signs: Temp: 97.9 F (36.6 C) (11/30 0731) Temp Source: Oral (11/30 0731) BP: 151/90 (11/30 0936) Pulse Rate: 76 (11/30 0934)  Labs: Recent Labs    01/27/21 0443 01/28/21 0331 01/28/21 1650 01/29/21 0203  HGB 10.9* 9.2* 9.1* 9.7*  HCT 34.3* 29.6* 29.5* 31.1*  PLT 165 134* 126* 122*  HEPARINUNFRC 0.37 0.34  --  0.34  CREATININE 5.49* 7.06*  --   --      Estimated Creatinine Clearance: 5.2 mL/min (A) (by C-G formula based on SCr of 7.06 mg/dL (H)).   Medical History: Past Medical History:  Diagnosis Date   Anemia    Anxiety    Atrial flutter ([place]) 02/22/2015   C. difficile colitis 10/10/2011   Chronic diarrhea    Chronic pain    Depression    ESRD on hemodialysis [facility])    Dialysis [facility]   GERD (gastroesophageal reflux disease)    HIV (human immunodeficiency virus infection) ([place])    Hypertension    Insomnia    Intracranial hemorrhage (HCC)    Muscle weakness (generalized)    Pulmonary HTN (HCC)    Tachycardia    TIA (transient ischemic attack)    [facility] do not have this dx   Traumatic hematoma of right upper arm 02/22/2015     Medications:  Medications Prior to Admission  Medication Sig Dispense Refill Last Dose   Amino Acids-Protein Hydrolys (PRO-STAT 64 PO) Take by mouth. Give 83ml by mouth in the morning for wound healing   01/23/2021   apixaban (ELIQUIS) 2.5 MG TABS tablet Take 1 tablet (2.5 mg total) by mouth 2 (two) times daily. 60 tablet 0 01/23/2021 at 0800   baclofen (LIORESAL) 10 MG tablet Take 10 mg by mouth daily as needed for muscle spasms.   Past Month   clopidogrel (PLAVIX) 75 MG tablet Take 1 tablet (75 mg total) by mouth daily with breakfast. 30 tablet  Past Month   collagenase (SANTYL) ointment Apply 1 application topically daily. Apply to sacrum topically every day shift for wound healing      cyanocobalamin (,VITAMIN B-12,) 1000 MCG/ML injection Inject 1,000 mcg into the muscle every 30 (thirty) days. On the 28th of every month   Past Month   dolutegravir (TIVICAY) 50 MG tablet Take 50 mg by mouth daily at 4 PM.   Past Week   Emollient (MINERIN) LOTN Apply 1 application topically every 12 (twelve) hours as needed (dry skin). Apply to feet and ankles      fentaNYL (DURAGESIC) 25 MCG/HR Place 1 patch onto the skin every 3 (three) days. 3 patch 0 Past Week   lamivudine (  EPIVIR) 100 MG tablet Take 50 mg by mouth daily. (0800)   01/23/2021   Lidocaine HCl 4 % CREA Apply 1 application topically every 12 (twelve) hours as needed (pain).      lidocaine-prilocaine (EMLA) cream Apply 1 application topically See admin instructions. (0900) Apply to fistula site one hour before dialysis on Tuesday, Thursday, and Saturday.   Past Month   megestrol (MEGACE) 400 MG/10ML suspension Take 400 mg by mouth daily.   01/23/2021   melatonin 3 MG TABS tablet Take 3 mg by mouth at bedtime. (2000)   Past Week   methadone (DOLOPHINE) 5 MG tablet Take 0.5 tablets (2.5 mg total) by mouth daily at 8 pm. (2000) 3 tablet 0 Past Week   metoprolol tartrate (LOPRESSOR) 25 MG tablet Take 1 tablet (25 mg total) by mouth 2 (two)  times daily. 60 tablet 0 01/23/2021 at 0800   mirtazapine (REMERON) 15 MG tablet Take 15 mg by mouth at bedtime.   Past Month   nitroGLYCERIN (NITROSTAT) 0.4 MG SL tablet Place 0.4 mg under the tongue every 5 (five) minutes x 3 doses as needed for chest pain.    unk at unk   ondansetron (ZOFRAN ODT) 8 MG disintegrating tablet Take 1 tablet (8 mg total) by mouth every 8 (eight) hours as needed for nausea or vomiting. 30 tablet 3 Past Month   Oxycodone HCl 10 MG TABS Take 1 tablet (10 mg total) by mouth every 12 (twelve) hours as needed (severe pain). 4 tablet 0 Past Week   pantoprazole (PROTONIX) 40 MG tablet Take 1 tablet (40 mg total) by mouth daily before breakfast. 30 tablet 3 01/23/2021   promethazine (PHENERGAN) 12.5 MG tablet Take 12.5 mg by mouth See admin instructions. Take 1 tablet (12.5 mg) by mouth in the morning every Tuesday, Thursday and Saturday for nausea/vomiting and every 6 hours as needed for n/v   01/23/2021   senna (SENOKOT) 8.6 MG tablet Take 2 tablets by mouth every evening. (2100)   Past Week   vancomycin (VANCOCIN) 500-5 MG/100ML-% IVPB Inject 100 mLs (500 mg total) into the vein Every Tuesday,Thursday,and Saturday with dialysis. 100 mL 0 01/23/2021   venlafaxine XR (EFFEXOR-XR) 150 MG 24 hr capsule Take 150 mg by mouth daily with breakfast. (0900)   01/23/2021   vitamin C (ASCORBIC ACID) 500 MG tablet Take 500 mg by mouth 2 (two) times daily.   01/23/2021   zidovudine (RETROVIR) 100 MG capsule Take 100 mg by mouth 3 (three) times daily. (0900, 1300, & 2100)   01/23/2021   Zinc Sulfate (ZINC-220 PO) Take 1 tablet by mouth daily.   01/23/2021   Multiple Vitamin (MULTIVITAMIN WITH MINERALS) TABS tablet Take 1 tablet by mouth at bedtime. (2000)      Scheduled:   amLODipine  10 mg Oral Daily   calcitRIOL  1.25 mcg Per Tube Q T,Th,Sa-HD   calcium acetate (Phos Binder)  1,334 mg Per Tube TID WC   chlorhexidine  15 mL Mouth Rinse BID   Chlorhexidine Gluconate Cloth  6 each  Topical Q0600   Chlorhexidine Gluconate Cloth  6 each Topical Q0600   cinacalcet  90 mg Oral Q T,Th,Sa-HD   clopidogrel  75 mg Per Tube Q breakfast   collagenase  1 application Topical Daily   darbepoetin (ARANESP) injection - DIALYSIS  40 mcg Intravenous Q Tue-HD   dolutegravir  50 mg Per Tube q1600   feeding supplement (PROSource TF)  45 mL Per Tube Daily   hydrALAZINE  50 mg Oral Q8H   lamiVUDine  50 mg Per Tube Daily   lidocaine-prilocaine  1 application Topical Q T,Th,Sa-HD   mouth rinse  15 mL Mouth Rinse q12n4p   metoprolol tartrate  25 mg Per Tube BID   mirtazapine  15 mg Per Tube QHS   pantoprazole (PROTONIX) IV  40 mg Intravenous QHS   polyethylene glycol  17 g Per Tube Daily   senna  2 tablet Per Tube QPM   thiamine injection  100 mg Intravenous Daily   vancomycin  500 mg Intravenous Q T,Th,Sa-HD   venlafaxine  75 mg Per Tube BID   Zidovudine  100 mg Per Tube Q8H   Infusions:   sodium chloride     sodium chloride     clevidipine Stopped (01/29/21 0854)   feeding supplement (OSMOLITE 1.2 CAL) 60 mL/hr at 01/28/21 1800   heparin 850 Units/hr (01/29/21 0900)   lacosamide (VIMPAT) IV Stopped (01/28/21 2329)   levETIRAcetam Stopped (01/28/21 1808)   levETIRAcetam Stopped (01/29/21 4332)    Assessment: AC: 70yo W with hypertensive emergency and PRES on apixaban PTA for aflutter, now held. Last dose of Eliquis was taken 11/24 Pharmacy consulted for heparin   Heparin level 0.34 (850 units/hr) No overt s/s of bleeding per RN.   ID: Admitted on 01/23/2021 with MRSA infection of right AV fistula on vancomycin PTA with plan to continue to 12/14 (started 10/28, for 6 weeks).  Patient gets dialysis TTSa but has received inconsistent outpatient antibiotic therapy due to missing hemodialysis or ending session early.  Pharmacy has been consulted for vancomycin dosing.  11/28 Vanc R-18 (on vancomycin 500 mg with TTSa HD)  Antimicrobials this admission: Vancomycin 10/28  >> 12/14      Microbiology results: 11/26 BCx: ngtd 11/26 MRSA PCR: neg  10/28 Arm abscess + MRSA   Goal of Therapy:  Heparin level 0.3-0.7 units/ml aPTT 66-102 seconds Monitor platelets by anticoagulation protocol: Yes   Plan:  Continue heparin infusion at 850 units/hr  Monitor daily aPTT, HL, CBC/plt Monitor for signs/symptoms of bleeding  F/u restart apixaban eventually  Continue Vancomycin 500mg  Post-HD on TTSa F/u HD schedule, adjust doses as needed F/u Weekly vancomycin level on Mondays- next 12/5 End date 12/14   Thank you for allowing pharmacy to be a part of this patient's care.  Donnald Garre, PharmD Clinical Pharmacist  Please check AMION for all [place] numbers After 10:00 PM, call [place] [phone]

## 2021-01-29 NOTE — Progress Notes (Signed)
NAME:  [name], MRN:  [id number], DOB:  [birth year], LOS: 6 ADMISSION DATE:  01/23/2021, CONSULTATION DATE:  01/24/2021 REFERRING MD:  Manuella Ghazi , CHIEF COMPLAINT:  Syncope   History of Present Illness:  70 year old woman who presented to [facility] ED 11/24 with syncope. 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  While at HD 11/24, patient experienced syncopal event while on HD (s/p removal of 500cc fluid in HD) as well as intermittent confusion. CT Head negative for acute process.  Patient was subsequently admitted to the hospitalist service. On 11/25, patient became hypertensive, had a seizure and Neuro was consulted. MRI concerning for PRES. The patient was started on Cardene gtt for BP control. Transferred to [facility] for LTM EEG.  PCCM was consulted for assistance with management.  Pertinent Medical History:  ESRD on HD (TTS), a flutter, chronic pain, depression, GERD, HIV, HTN, infection of fistula on vanc treatment  Significant Hospital Events: Including procedures, antibiotic start and stop dates in addition to other pertinent events   11/24 Presented with syncope 11/25 Seizure, CTH neg, MRI concern for PRES, request for transfer to [facility]. 11/26 Arrival to [facility] 11/28 Improved mental status, LTM EEG with GPDs/triphasic morphology, low potential for seizure recurrence - better compared to prior. Remains on Cleviprex. Failed bedside eval with speech, will need FEES. Cortrak placed. 11/29 Stable mental status/neuro exam. D/c LTM per Neuro. Volume up on exam, HD today. FEES today.  Interim History / Subjective:  Slightly drowsy , but interacting appropriately Core track in place Off Cleviprex  Objective:  Blood pressure (!) 151/90, pulse 76, temperature 97.9 F (36.6 C), temperature source Oral, resp. rate 17, weight 42.4 kg, SpO2 94 %.        Intake/Output Summary  (Last 24 hours) at 01/29/2021 1004 Last data filed at 01/29/2021 0900 Gross per 24 hour  Intake 2054.96 ml  Output 2000 ml  Net 54.96 ml   Filed Weights   01/23/21 1830 01/29/21 0500  Weight: 43.4 kg 42.4 kg   Physical Examination: General: Chronically ill-appearing HEENT: Edentulous, moist oral mucosa Neuro: Dysarthric, answers simple questions and follow simple commands CV: Irregularly irregular rhythm, PULM: Poor air movement, bibasal rales GI: Soft, bowel sounds appreciated Extremities: Lower extremity edema  grafting, Kerlix gauze dressing in place. Skin: Warm/dry, no rashes.  Resolved Hospital Problem List:  Hyperkalemia, resolved  Assessment & Plan:   Acute encephalopathy with press Nonconvulsive status epilepticus Hypertensive emergency -MRI 11/25 noted -EEG with nonconvulsive status epilepticus -Currently no seizures on EEG on 1129  Paroxysmal atrial fibrillation with rapid ventricular response -Cardiac monitoring -on metoprolol -On heparin, transition to Eliquis prior to discharge  MRSA infection of right AV fistula -On vancomycin with hemodialysis -Wound care consulted  End-stage renal disease on hemodialysis -Renal following  HIV -On antiretrovirals  Anemia of chronic disease Thrombocytopenia -Trend  Severe protein calorie malnutrition -Has core track in place -Continue supplementation  GERD -PPI   Patient is DNR status  Transfer to medical floor-neuro progressive floor  Best Practice (right click and "Reselect all SmartList Selections" daily)   Diet/type: NPO w/ oral meds, SLP eval recommending NPO, FEES 11/29 DVT prophylaxis: SCD GI prophylaxis: PPI Lines: Dialysis Catheter Foley:  N/A Code Status:  DNR/DNI - See PC note on 11/7 Last date of multidisciplinary goals of care discussion [11/28, patient's daughter was updated over the phone]   The patient is critically  ill with multiple organ systems failure and requires high  complexity decision making for assessment and support, frequent evaluation and titration of therapies, application of advanced monitoring technologies and extensive interpretation of multiple databases. Critical Care Time devoted to patient care services described in this note independent of APP/resident time (if applicable)  is 32 minutes.   Sherrilyn Rist MD [place] [facility] Personal pager: See Amion If unanswered, please page CCM On-call: [phone]

## 2021-01-29 NOTE — Progress Notes (Signed)
[facility] NEPHROLOGY PROGRESS NOTE  Assessment/ Plan:  Outpatient HD orders:[name] TTS,EDW 41.5 kg,[place]ath 2K/2.5 ca,BF 350  DF 500,Time - 180 minutes Access catheter  Medications - out of hectorol and now on calcitriol - 1.25 mcg each tx. Sensipar 90 mg three times a week; Epogen 3600 units each tx, Heparin 1000 units bolus then 500 units per hour Has been on vanc 500 mg each tx (though misses often)   # Acute encephalopathy: EEG with moderate diffuse encephalopathy of nonspecific etiology.  Based on neurology evaluation appears to be consistent with PRES along with imaging showing no evidence of acute CVA.  Per neurology.  She looks more confused and somnolent today.  # ESRD: She is on an outpatient TTS schedule, status post dialysis yesterday with around 2 L UF.  Plan for next HD tomorrow.  Left IJ TDC for the access.  # Anemia: Without overt blood loss, continue ESA with HD.  # CKD-MBD: Resumed  calcitriol and Sensipar for PTH control.  Started calcium acetate for hyperphosphatemia and the repeat lab looks better.  Continue to monitor.  #  Right radiocephalic fistula infection: Status post ligation and debridement on 10/28 with inconsistent outpatient antibiotic therapy due to missing hemodialysis or signing off early.  Restarted back on antibiotic therapy with vancomycin.  # Hypertension: Blood pressures under decent control at this time, will continue to follow closely status post hemodialysis/UF.  Apparently she is on Cleviprex drip.  Discussed with the ICU to wean it off.  Subjective: Seen and examined.  She remains somnolent.  Continue to be on Cleviprex with acceptable blood pressure reading.  Unable to obtain review of system.  Discussed with ICU nurse and the team.  Objective Vital signs in last 24 hours: Vitals:   01/29/21 0700 01/29/21 0731 01/29/21 0758 01/29/21 0800  BP: 124/65  124/65 113/74  Pulse: 67   68  Resp: 20   17  Temp:  97.9 F (36.6 C)     TempSrc:  Oral    SpO2: 94%   94%  Weight:       Weight change:   Intake/Output Summary (Last 24 hours) at 01/29/2021 0921 Last data filed at 01/29/2021 0900 Gross per 24 hour  Intake 2129.44 ml  Output 2000 ml  Net 129.44 ml        Labs: Basic Metabolic Panel: Recent Labs  Lab 01/26/21 0332 01/27/21 0443 01/28/21 0331 01/29/21 0203  NA 139 132* 131*  --   K 4.2 4.2 4.4  --   CL 103 97* 95*  --   CO2 21* 21* 19*  --   GLUCOSE 82 77 81  --   BUN 36* 16 21  --   CREATININE 8.38* 5.49* 7.06*  --   CALCIUM 8.2* 8.7* 8.7*  --   PHOS 5.7* 6.3* 8.0* 3.9    Liver Function Tests: Recent Labs  Lab 01/23/21 1016 01/24/21 0134 01/24/21 1440 01/25/21 0209 01/26/21 0332 01/27/21 0443 01/28/21 0331  AST 17 27 20   --   --   --   --   ALT 10 11 11   --   --   --   --   ALKPHOS 87 95 84  --   --   --   --   BILITOT 0.9 0.9 0.5  --   --   --   --   PROT 7.5 8.4* 8.0  --   --   --   --   ALBUMIN 3.0*  3.3* 3.2*   < > 2.8* 2.7* 2.6*   < > = values in this interval not displayed.    No results for input(s): LIPASE, AMYLASE in the last 168 hours. Recent Labs  Lab 01/23/21 1501 01/24/21 0134  AMMONIA 25 65*    CBC: Recent Labs  Lab 01/23/21 1016 01/24/21 0134 01/24/21 1331 01/25/21 0209 01/26/21 0331 01/27/21 0443 01/28/21 0331 01/28/21 1650 01/29/21 0203  WBC 3.2*   < > 4.7   < > 5.1 5.1 4.7 4.1 5.3  NEUTROABS 2.4  --  3.8  --   --   --   --   --   --   HGB 10.1*   < > 10.2*   < > 9.7* 10.9* 9.2* 9.1* 9.7*  HCT 34.4*   < > 34.0*   < > 31.9* 34.3* 29.6* 29.5* 31.1*  MCV 104.2*   < > 104.3*   < > 100.9* 97.4 96.7 97.7 96.3  PLT 147*   < > 143*   < > 139* 165 134* 126* 122*   < > = values in this interval not displayed.    Cardiac Enzymes: No results for input(s): CKTOTAL, CKMB, CKMBINDEX, TROPONINI in the last 168 hours. CBG: Recent Labs  Lab 01/24/21 0124 01/24/21 1131 01/28/21 2316 01/29/21 0319 01/29/21 0729  GLUCAP 127* 126* 123* 111* 113*      Iron Studies: No results for input(s): IRON, TIBC, TRANSFERRIN, FERRITIN in the last 72 hours. Studies/Results: DG Abd 1 View  Result Date: 01/28/2021 CLINICAL DATA:  Feeding tube placement EXAM: ABDOMEN - 1 VIEW COMPARISON:  Portable exam 1018 hours compared to 01/27/2021 FINDINGS: Tip of feeding tube projects over distal gastric antrum/pylorus. Nonobstructive bowel gas pattern. Bones demineralized. Lung bases clear. IMPRESSION: Tip of feeding tube projects over distal gastric antrum/pylorus. Electronically Signed   By: Lavonia Dana M.D.   On: 01/28/2021 13:13   DG Abd Portable 1V  Result Date: 01/27/2021 CLINICAL DATA:  Post feeding tube placement. EXAM: PORTABLE ABDOMEN - 1 VIEW COMPARISON:  CT abdomen and pelvis 10/01/2019. FINDINGS: Feeding tube tip is at the level of the gastric antrum. No dilated bowel loops visualized. No acute fractures. IMPRESSION: Feeding tube tip at the level of the gastric antrum. Electronically Signed   By: Ronney Asters M.D.   On: 01/27/2021 15:40    Medications: Infusions:  sodium chloride     sodium chloride     clevidipine Stopped (01/29/21 0854)   feeding supplement (OSMOLITE 1.2 CAL) 60 mL/hr at 01/28/21 1800   heparin 850 Units/hr (01/29/21 0900)   lacosamide (VIMPAT) IV Stopped (01/28/21 2329)   levETIRAcetam Stopped (01/28/21 1808)   levETIRAcetam Stopped (01/29/21 4128)    Scheduled Medications:  amLODipine  10 mg Oral Daily   calcitRIOL  1.25 mcg Per Tube Q T,Th,Sa-HD   calcium acetate (Phos Binder)  1,334 mg Per Tube TID WC   chlorhexidine  15 mL Mouth Rinse BID   Chlorhexidine Gluconate Cloth  6 each Topical Q0600   cinacalcet  90 mg Oral Q T,Th,Sa-HD   clopidogrel  75 mg Per Tube Q breakfast   collagenase  1 application Topical Daily   darbepoetin (ARANESP) injection - DIALYSIS  40 mcg Intravenous Q Tue-HD   dolutegravir  50 mg Per Tube q1600   feeding supplement (PROSource TF)  45 mL Per Tube Daily   hydrALAZINE  50 mg Oral Q8H    lamiVUDine  50 mg Per Tube Daily   lidocaine-prilocaine  1 application  Topical Q T,Th,Sa-HD   mouth rinse  15 mL Mouth Rinse q12n4p   metoprolol tartrate  25 mg Per Tube BID   mirtazapine  15 mg Per Tube QHS   pantoprazole (PROTONIX) IV  40 mg Intravenous QHS   polyethylene glycol  17 g Per Tube Daily   senna  2 tablet Per Tube QPM   thiamine injection  100 mg Intravenous Daily   vancomycin  500 mg Intravenous Q T,Th,Sa-HD   venlafaxine  75 mg Per Tube BID   Zidovudine  100 mg Per Tube Q8H    have reviewed scheduled and prn medications.  Physical Exam: General: Ill-looking female, somnolent lying on bed, not really following command Heart:RRR, s1s2 nl Lungs:clear b/l, no crackle Abdomen:soft, Non-tender, non-distended Extremities:No edema Dialysis Access: Left IJ TDC in place.  [name] 01/29/2021,9:21 AM  LOS: 6 days

## 2021-01-30 DIAGNOSIS — N186 End stage renal disease: Secondary | ICD-10-CM | POA: Diagnosis not present

## 2021-01-30 DIAGNOSIS — E871 Hypo-osmolality and hyponatremia: Secondary | ICD-10-CM

## 2021-01-30 DIAGNOSIS — I10 Essential (primary) hypertension: Secondary | ICD-10-CM | POA: Diagnosis not present

## 2021-01-30 DIAGNOSIS — D696 Thrombocytopenia, unspecified: Secondary | ICD-10-CM

## 2021-01-30 DIAGNOSIS — G9340 Encephalopathy, unspecified: Secondary | ICD-10-CM | POA: Diagnosis not present

## 2021-01-30 DIAGNOSIS — I6783 Posterior reversible encephalopathy syndrome: Secondary | ICD-10-CM | POA: Diagnosis not present

## 2021-01-30 LAB — RENAL FUNCTION PANEL
Albumin: 2.5 g/dL — ABNORMAL LOW (ref 3.5–5.0)
Anion gap: 11 (ref 5–15)
BUN: 31 mg/dL — ABNORMAL HIGH (ref 8–23)
CO2: 24 mmol/L (ref 22–32)
Calcium: 7.7 mg/dL — ABNORMAL LOW (ref 8.9–10.3)
Chloride: 93 mmol/L — ABNORMAL LOW (ref 98–111)
Creatinine, Ser: 5.41 mg/dL — ABNORMAL HIGH (ref 0.44–1.00)
GFR, Estimated: 8 mL/min — ABNORMAL LOW (ref >60)
Glucose, Bld: 92 mg/dL (ref 70–99)
Phosphorus: 3.2 mg/dL (ref 2.5–4.6)
Potassium: 4.2 mmol/L (ref 3.5–5.1)
Sodium: 128 mmol/L — ABNORMAL LOW (ref 135–145)

## 2021-01-30 LAB — CBC
HCT: 30.8 % — ABNORMAL LOW (ref 36.0–46.0)
Hemoglobin: 9.4 g/dL — ABNORMAL LOW (ref 12.0–15.0)
MCH: 29.9 pg (ref 26.0–34.0)
MCHC: 30.5 g/dL (ref 30.0–36.0)
MCV: 98.1 fL (ref 80.0–100.0)
Platelets: 117 10*3/uL — ABNORMAL LOW (ref 150–400)
RBC: 3.14 MIL/uL — ABNORMAL LOW (ref 3.87–5.11)
RDW: 18.4 % — ABNORMAL HIGH (ref 11.5–15.5)
WBC: 5.2 10*3/uL (ref 4.0–10.5)
nRBC: 0 % (ref 0.0–0.2)

## 2021-01-30 LAB — HEPARIN LEVEL (UNFRACTIONATED)
Heparin Unfractionated: 0.11 IU/mL — ABNORMAL LOW (ref 0.30–0.70)
Heparin Unfractionated: <0.1 IU/mL — ABNORMAL LOW (ref 0.30–0.70)

## 2021-01-30 LAB — GLUCOSE, CAPILLARY
Glucose-Capillary: 93 mg/dL (ref 70–99)
Glucose-Capillary: 109 mg/dL — ABNORMAL HIGH (ref 70–99)
Glucose-Capillary: 114 mg/dL — ABNORMAL HIGH (ref 70–99)
Glucose-Capillary: 97 mg/dL (ref 70–99)

## 2021-01-30 LAB — PHOSPHORUS: Phosphorus: 3.7 mg/dL (ref 2.5–4.6)

## 2021-01-30 LAB — MAGNESIUM: Magnesium: 1.8 mg/dL (ref 1.7–2.4)

## 2021-01-30 LAB — OLIGOCLONAL BANDS, CSF + SERM

## 2021-01-30 MED ORDER — HEPARIN SODIUM (PORCINE) 1000 UNIT/ML DIALYSIS
1000.0000 [IU] | INTRAMUSCULAR | Status: DC | PRN
  Filled 2021-01-30: qty 1

## 2021-01-30 MED ORDER — SODIUM CHLORIDE 0.9 % IV SOLN: 100.0000 mL | INTRAVENOUS | Status: DC | PRN

## 2021-01-30 MED ORDER — LIDOCAINE HCL (PF) 1 % IJ SOLN
5.0000 mL | INTRAMUSCULAR | Status: DC | PRN
  Filled 2021-01-30: qty 5

## 2021-01-30 MED ORDER — HEPARIN SODIUM (PORCINE) 1000 UNIT/ML DIALYSIS
20.0000 [IU]/kg | INTRAMUSCULAR | Status: DC | PRN
  Administered 2021-01-30: 800 [IU] via INTRAVENOUS_CENTRAL
  Filled 2021-01-30: qty 1

## 2021-01-30 MED ORDER — APIXABAN 2.5 MG PO TABS
2.5000 mg | ORAL_TABLET | Freq: Two times a day (BID) | ORAL | Status: DC
Start: 2021-01-30 — End: 2021-02-01
  Administered 2021-01-30 – 2021-01-31 (×4): 2.5 mg
  Filled 2021-01-30 (×5): qty 1

## 2021-01-30 MED ORDER — LIDOCAINE-PRILOCAINE 2.5-2.5 % EX CREA: 1.0000 "application " | TOPICAL_CREAM | CUTANEOUS | Status: DC | PRN

## 2021-01-30 MED ORDER — ALTEPLASE 2 MG IJ SOLR: 2.0000 mg | Freq: Once | INTRAMUSCULAR | Status: DC | PRN

## 2021-01-30 MED ORDER — VANCOMYCIN HCL 500 MG/100ML IV SOLN
INTRAVENOUS | Status: AC
  Administered 2021-01-30: 500 mg
  Filled 2021-01-30: qty 100

## 2021-01-30 MED ORDER — PENTAFLUOROPROP-TETRAFLUOROETH EX AERO: 1.0000 "application " | INHALATION_SPRAY | CUTANEOUS | Status: DC | PRN

## 2021-01-30 NOTE — Progress Notes (Signed)
Speech Language Pathology Treatment: Dysphagia  Patient Details Name: [name] MRN: [id number] DOB: 09/03/1950 Today's Date: 01/30/2021 Time: 5427-0623 SLP Time Calculation (min) (ACUTE ONLY): 12 min  Assessment / Plan / Recommendation Clinical Impression  Received new order for pt who is currently on Speech caseload and underwent FEES 1/29 in which she was awake however not as responsive, drowsy and was unable to propel puree bolus attempts. Thin liquids were aspirated. Today Karma was in the chair with a scowled expression stating she did not feel well today. She was more alert and interactive then Tues and accepted applesauce with continued lingual edema needing verbal cues to open wider to receive spoon effectively. Transit was good with subjectively observed timely initiation and no indications of laryngeal compromise. If she continues to demonstrate alertness and follow commands, recommend repeat instrumental swallow with an MBS tomorrow. Continue Cortrak feeds and oral care.    HPI HPI: [name] is a 70 y.o. female with medical history significant for ESRD on HD TTS, GERD, TIA, HIV, atrial flutter on Eliquis, anemia of chronic disease, chronic pain. Admitted from hemodialysis with syncopal episode, altered mentation, elevated blood pressure readings and seizure activity overnight.  MRI no acute CVA and findings concerning for PRES; Cortical and subcortical edema in the occipital parietal lobes  bilaterally, right for slightly greater than left.  BSE 11/20/20 prolonged mastication, recommended Dys 2, thin and upgraded to regular several days later.      SLP Plan  Continue with current plan of care      Recommendations for follow up therapy are one component of a multi-disciplinary discharge planning process, led by the attending physician.  Recommendations may be updated based on patient status, additional functional criteria and insurance authorization.    Recommendations   Diet recommendations: NPO Medication Administration: Via alternative means                Oral Care Recommendations: Oral care QID Follow Up Recommendations: Skilled nursing-short term rehab (<3 hours/day) Assistance recommended at discharge: Frequent or constant Supervision/Assistance SLP Visit Diagnosis: Dysphagia, oropharyngeal phase (R13.12) Plan: Continue with current plan of care       GO                Houston Siren  01/30/2021, 9:12 AM

## 2021-01-30 NOTE — Evaluation (Signed)
Occupational Therapy Evaluation Patient Details Name: [name] MRN: [id number] DOB: 31-May-1950 Today's Date: 01/30/2021   History of Present Illness 70 yo admitted 11/24 after syncope at HD. 11/25 pt with HTN and seizure with LTM EEG. Pt with PRES. PMhx: HTN, Aflutter, pulmonary HTN, HIV, GERD, chronic pain, ESRD on HD TTS   Clinical Impression   PTA patient reports independent with bathing/dressing, but per chart review pt required assist from staff at SNF for Adls at bed level and able to self feed/groom--questionable historian.  She was admitted for above and is limited by weakness, impaired balance, decreased activity tolerance and impaired cognition.  She is oriented to self and month only, initially reports [place] [facility] but able to correct with cueing to [facility] (and recall at the end of session).  She has limited verbalizations, presents with poor problem solving, awareness and slow processing. Limited assessment as patient declined EOB or OOB, due to headache and fatigue.  She is able to assist boosting in bed with mod assist, completes ADLs at bed level with up to max assist for LB, mod assist for UB and grooming.  She will benefit from further OT services while admitted and after dc at SNF level to decrease burden of care with ADLs and mobility. Will follow.      Recommendations for follow up therapy are one component of a multi-disciplinary discharge planning process, led by the attending physician.  Recommendations may be updated based on patient status, additional functional criteria and insurance authorization.   Follow Up Recommendations  Skilled nursing-short term rehab (<3 hours/day)    Assistance Recommended at Discharge Frequent or constant Supervision/Assistance  Functional Status Assessment  Patient has had a recent decline in their functional status and demonstrates the ability to make significant improvements in function in a reasonable and predictable amount of  time.  Equipment Recommendations  None recommended by OT    Recommendations for Other Services       Precautions / Restrictions Precautions Precautions: Fall Precaution Comments: cortrack, flexiseal Restrictions Weight Bearing Restrictions: No      Mobility Bed Mobility Overal bed mobility: Needs Assistance Bed Mobility: Rolling;Sidelying to Sit Rolling: Mod assist Sidelying to sit: Mod assist       General bed mobility comments: mod assist to reposition in bed, using BLEs to push towards [facility]    Transfers Overall transfer level: Needs assistance       General transfer comment: pt declined due to fatigue and headache      Balance Overall balance assessment: Needs assistance Sitting-balance support: Feet supported Sitting balance-Leahy Scale: Poor Sitting balance - Comments: sitting supported upright in bed with L lateral lean dynamically during LB ADLs                              ADL either performed or assessed with clinical judgement   ADL Overall ADL's : Needs assistance/impaired Eating/Feeding: NPO   Grooming: Wash/dry hands;Wash/dry face;Moderate assistance;Bed level Grooming Details (indicate cue type and reason): upright in bed, requires mod assist for thoroughness Upper Body Bathing: Moderate assistance;Bed level   Lower Body Bathing: Maximal assistance;Bed level   Upper Body Dressing : Moderate assistance;Bed level   Lower Body Dressing: Maximal assistance;Bed level Lower Body Dressing Details (indicate cue type and reason): pt attempting to reach to manage socks, requires assist to pull up L sock but able to reach R sock   Toilet Transfer Details (indicate cue type  and reason): pt declined, fatigue and headache         Functional mobility during ADLs: Moderate assistance       Vision   Additional Comments: no apparent deficits, contniue assessment     Perception     Praxis      Pertinent Vitals/Pain Pain Assessment:  Faces Faces Pain Scale: Hurts little more Pain Location: headache Pain Descriptors / Indicators: Headache Pain Intervention(s): Monitored during session;Patient requesting pain meds-RN notified     Hand Dominance Right   Extremity/Trunk Assessment Upper Extremity Assessment Upper Extremity Assessment: Generalized weakness   Lower Extremity Assessment Lower Extremity Assessment: Defer to PT evaluation   Cervical / Trunk Assessment Cervical / Trunk Assessment: Kyphotic   Communication Communication Communication: Expressive difficulties   Cognition Arousal/Alertness: Awake/alert Behavior During Therapy: Flat affect Overall Cognitive Status: Impaired/Different from baseline Area of Impairment: Orientation;Memory;Attention;Following commands;Safety/judgement;Problem solving;Awareness                 Orientation Level: Disoriented to;Situation;Place Current Attention Level: Focused Memory: Decreased short-term memory;Decreased recall of precautions Following Commands: Follows one step commands inconsistently;Follows one step commands with increased time Safety/Judgement: Decreased awareness of safety;Decreased awareness of deficits Awareness: Intellectual Problem Solving: Slow processing;Decreased initiation;Difficulty sequencing;Requires verbal cues General Comments: pt with limited verbalizations during session, oriented to self and voicies at "[place]" initally able to correct with cueing and recall at end of session.  She demonstrates poor awareness to deficits, and presents with slow processing     General Comments  VSS on RA    Exercises     Shoulder Instructions      Home Living Family/patient expects to be discharged to:: Skilled nursing facility                                 Additional Comments: pt unable to reports home situation, but per chart review from [facility]      Prior Functioning/Environment Prior Level of Function :  Independent/Modified Independent;Patient poor historian/Family not available             Mobility Comments: pt reports she walks without assist, prior admission states assist to [facility] ADLs Comments: pt reports independent bathing/dressing but per chart review pt requires assist from staff at bed level; feeds and grooms self.        OT Problem List: Decreased strength;Decreased range of motion;Decreased activity tolerance;Impaired balance (sitting and/or standing);Decreased cognition;Decreased safety awareness;Decreased knowledge of use of DME or AE;Decreased knowledge of precautions      OT Treatment/Interventions: Self-care/ADL training;DME and/or AE instruction;Therapeutic activities;Cognitive remediation/compensation;Patient/family education;Balance training;Therapeutic exercise    OT Goals(Current goals can be found in the care plan section) Acute Rehab OT Goals Patient Stated Goal: none stated OT Goal Formulation: Patient unable to participate in goal setting Time For Goal Achievement: 02/13/21 Potential to Achieve Goals: Fair  OT Frequency: Min 2X/week   Barriers to D/C:            Co-evaluation              AM-PAC OT "6 Clicks" Daily Activity     Outcome Measure Help from another person eating meals?: Total Help from another person taking care of personal grooming?: A Lot Help from another person toileting, which includes using toliet, bedpan, or urinal?: Total Help from another person bathing (including washing, rinsing, drying)?: A Lot Help from another person to put on and taking off regular upper  body clothing?: A Lot Help from another person to put on and taking off regular lower body clothing?: Total 6 Click Score: 9   End of Session Nurse Communication: Mobility status;Patient requests pain meds  Activity Tolerance: Patient limited by fatigue Patient left: in bed;with call bell/phone within reach;with bed alarm set  OT Visit Diagnosis: Other  abnormalities of gait and mobility (R26.89);Muscle weakness (generalized) (M62.81);Other symptoms and signs involving cognitive function                Time: 1253-1313 OT Time Calculation (min): 20 min Charges:  OT General Charges $OT Visit: 1 Visit OT Evaluation $OT Eval Moderate Complexity: 1 Mod  Jolaine Artist, OT Acute Rehabilitation Services Pager [phone] Office [phone]   Delight Stare 01/30/2021, 1:45 PM

## 2021-01-30 NOTE — Evaluation (Addendum)
Physical Therapy Evaluation Patient Details Name: [name] MRN: [id number] DOB: [birth year] Today's Date: 01/30/2021  History of Present Illness  70 yo admitted 11/24 after syncope at HD. 11/25 pt with HTN and seizure with LTM EEG. Pt with PRES. PMhx: HTN, Aflutter, pulmonary HTN, HIV, GERD, chronic pain, ESRD on HD TTS  Clinical Impression  Pt pleasant with decreased response to questions throughout session. Pt able to transition from bed to chair with 2 person assist and no family present to confirm PLOF. Pt with decreased strength, cognition, transfers and safety who will benefit from acute therapy to maximize mobility, safety and function to decrease burden of care.    HR 70, 98% RA, 127/73 supine , 146/82 in chair     Recommendations for follow up therapy are one component of a multi-disciplinary discharge planning process, led by the attending physician.  Recommendations may be updated based on patient status, additional functional criteria and insurance authorization.  Follow Up Recommendations Skilled nursing-short term rehab (<3 hours/day)    Assistance Recommended at Discharge Intermittent Supervision/Assistance  Functional Status Assessment Patient has had a recent decline in their functional status and demonstrates the ability to make significant improvements in function in a reasonable and predictable amount of time.  Equipment Recommendations  Rolling walker (2 wheels);BSC/3in1    Recommendations for Other Services       Precautions / Restrictions Precautions Precautions: Fall Restrictions Weight Bearing Restrictions: No      Mobility  Bed Mobility Overal bed mobility: Needs Assistance Bed Mobility: Rolling;Sidelying to Sit Rolling: Mod assist Sidelying to sit: Mod assist       General bed mobility comments: mod assist to roll to left and elevate trunk from surface    Transfers Overall transfer level: Needs assistance   Transfers: Sit to/from  Stand;Bed to chair/wheelchair/BSC   Stand pivot transfers: Min assist;+2 safety/equipment Step pivot transfers: Mod assist;+2 safety/equipment       General transfer comment: min assist to rise from bed x 2. Pt with flexed hips, knees and trunk needing cues and assist to extend and increase BOS. Pt able to step 3' from bed to chair with RW with mod +2 assist with max cues    Ambulation/Gait               General Gait Details: not yet able  Stairs            Wheelchair Mobility    Modified Rankin (Stroke Patients Only)       Balance Overall balance assessment: Needs assistance Sitting-balance support: Feet supported Sitting balance-Leahy Scale: Poor Sitting balance - Comments: anterior lean sitting EOB   Standing balance support: Bilateral upper extremity supported Standing balance-Leahy Scale: Poor Standing balance comment: RW for standing                             Pertinent Vitals/Pain Pain Assessment: No/denies pain Faces Pain Scale: Hurts little more Pain Location: general Pain Intervention(s): Monitored during session    Home Living Family/patient expects to be discharged to:: Skilled nursing facility                   Additional Comments: Pt states she lives at home alone and has assist for HD transportation. Chart from last admission states she lives at [facility] with no one present to confirm    Prior Function Prior Level of Function : Independent/Modified Independent  Mobility Comments: pt reports she walks without assist, prior admission states assist to [facility] [place]       Hand Dominance        Extremity/Trunk Assessment   Upper Extremity Assessment Upper Extremity Assessment: Generalized weakness    Lower Extremity Assessment Lower Extremity Assessment: Generalized weakness    Cervical / Trunk Assessment Cervical / Trunk Assessment: Kyphotic  Communication   Communication: No difficulties   Cognition Arousal/Alertness: Awake/alert Behavior During Therapy: Flat affect Overall Cognitive Status: Impaired/Different from baseline Area of Impairment: Orientation;Memory;Attention;Following commands;Safety/judgement                 Orientation Level: Disoriented to;Time;Situation;Place Current Attention Level: Sustained Memory: Decreased short-term memory Following Commands: Follows one step commands inconsistently;Follows one step commands with increased time       General Comments: pt with limited responses to questions and tends to shake her head in response. Pt stating she lives alone but chart from last admission states SNF. pt not oriented to date and unable to recall after education        General Comments      Exercises     Assessment/Plan    PT Assessment Patient needs continued PT services  PT Problem List Decreased strength;Decreased activity tolerance;Decreased balance;Decreased mobility;Decreased cognition;Pain;Decreased coordination;Decreased safety awareness;Decreased knowledge of use of DME       PT Treatment Interventions DME instruction;Gait training;Therapeutic activities;Functional mobility training;Therapeutic exercise;Balance training;Neuromuscular re-education;Cognitive remediation;Patient/family education;Wheelchair mobility training    PT Goals (Current goals can be found in the Care Plan section)  Acute Rehab PT Goals Patient Stated Goal: walk PT Goal Formulation: With patient Time For Goal Achievement: 02/13/21 Potential to Achieve Goals: Fair    Frequency Min 2X/week   Barriers to discharge Decreased caregiver support      Co-evaluation               AM-PAC PT "6 Clicks" Mobility  Outcome Measure Help needed turning from your back to your side while in a flat bed without using bedrails?: A Lot Help needed moving from lying on your back to sitting on the side of a flat bed without using bedrails?: A Lot Help needed  moving to and from a bed to a chair (including a wheelchair)?: A Lot Help needed standing up from a chair using your arms (e.g., wheelchair or bedside chair)?: A Lot Help needed to walk in hospital room?: Total Help needed climbing 3-5 steps with a railing? : Total 6 Click Score: 10    End of Session Equipment Utilized During Treatment: Gait belt Activity Tolerance: Patient tolerated treatment well Patient left: with chair alarm set;with call bell/phone within reach;in chair Nurse Communication: Mobility status PT Visit Diagnosis: Other abnormalities of gait and mobility (R26.89);Muscle weakness (generalized) (M62.81);Pain    Time: 0102-7253 PT Time Calculation (min) (ACUTE ONLY): 27 min   Charges:   PT Evaluation $PT Eval Moderate Complexity: 1 Mod PT Treatments $Therapeutic Activity: 8-22 mins        Analissa Bayless P, PT Acute Rehabilitation Services Pager: [phone] Office: [phone]   Chasta Deshpande B Vincent Streater 01/30/2021, 10:12 AM

## 2021-01-30 NOTE — Progress Notes (Signed)
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  She also had intermittent confusion at that time.  After admission, on 11/25 became hypertensive, had a seizure and neurology was consulted.  An MRI of the brain was concerning for press.  She was started on Cardene drip and she was transferred to [facility] for ICU management and continuous EEG.  Her mental status continues to improve.  On 11/28 failed bedside speech eval and core track was placed.  On 11/29 her continuous EEG has been discontinued, and with continued improvement she was transferred to the hospitalist service on 12/1.  Nephrology also following for her dialysis needs.  Subjective / 24h Interval events: Appears very weak, but wakes up and is able to answer basic questions.  She has been off Cleviprex since yesterday.  Blood pressure is stable.  Assessment & Plan: Principal Problem:   PRES (posterior reversible encephalopathy syndrome) Active Problems:   Human immunodeficiency virus (HIV) disease (HCC)   Gastroesophageal reflux disease   Atrial fibrillation (HCC)   ESRD on dialysis (HCC)   Hyperkalemia   Macrocytic anemia   History of stroke   Seizure ([place])   Hypertensive emergency   MRSA infection   Acute delirium   Thrombocytopenia (HCC)   Hyponatremia   Principal Problem Acute encephalopathy with press, hypertensive emergency -patient had a seizure episode in the setting of hypertension. MRI showed cortical and subcortical edema in the occipital parietal lobes  bilaterally.  She was subsequently admitted to the ICU, improved and transferred to the hospitalist service on 12/1. Current antihypertensive regimen includes amlodipine 10 mg daily, hydralazine 50 mg every 8 hours, metoprolol 25 mg twice daily.  Encephalopathy is gradually improving.  Active Problems Seizure in the setting of press -neurology consulted and following.  EEG initially showed nonconvulsive status epilepticus.  She underwent continuous EEG monitoring.  Currently she is on Vimpat 150 mg every 12 as well as Keppra 1 g daily and an additional 250 mg with each HD.  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  ID was consulted and recommending vancomycin with dialysis for total of 6 weeks with a last day 02/12/2021.  Continue vancomycin for now.  Paroxysmal atrial fibrillation with rapid ventricular response -Cardiac monitoring, improved, rate controlled currently, on metoprolol.  She is on heparin, will transition to Eliquis once she passes swallow eval  ESRD, on HD-nephrology following   HIV -On antiretrovirals   Anemia of chronic disease, thrombocytopenia-in the setting of renal disease, monitor.  No bleeding.  Severe protein calorie malnutrition -Has core track in place, continue supplementation -Appreciate speech therapy, she will get modified barium swallow tomorrow  GERD -PPI   Hyponatremia-volume managed with dialysis  Scheduled Meds:  amLODipine  10 mg Oral Daily   calcitRIOL  1.25 mcg Per Tube Q T,Th,Sa-HD   calcium acetate (Phos Binder)  1,334 mg Per Tube TID WC  chlorhexidine  15 mL Mouth Rinse BID   Chlorhexidine Gluconate Cloth  6 each Topical Q0600   Chlorhexidine Gluconate Cloth  6 each Topical Q0600   cinacalcet  90 mg Oral Q T,Th,Sa-HD   clopidogrel  75 mg Per Tube Q breakfast    collagenase  1 application Topical Daily   darbepoetin (ARANESP) injection - DIALYSIS  40 mcg Intravenous Q Tue-HD   dolutegravir  50 mg Per Tube q1600   feeding supplement (PROSource TF)  45 mL Per Tube Daily   hydrALAZINE  50 mg Oral Q8H   lamiVUDine  50 mg Per Tube Daily   lidocaine-prilocaine  1 application Topical Q T,Th,Sa-HD   mouth rinse  15 mL Mouth Rinse q12n4p   metoprolol tartrate  25 mg Per Tube BID   mirtazapine  15 mg Per Tube QHS   pantoprazole (PROTONIX) IV  40 mg Intravenous QHS   polyethylene glycol  17 g Per Tube Daily   senna  2 tablet Per Tube QPM   thiamine injection  100 mg Intravenous Daily   vancomycin  500 mg Intravenous Q T,Th,Sa-HD   venlafaxine  75 mg Per Tube BID   Zidovudine  100 mg Per Tube Q8H   Continuous Infusions:  sodium chloride     sodium chloride     feeding supplement (OSMOLITE 1.2 CAL) 1,000 mL (01/29/21 1202)   heparin 850 Units/hr (01/30/21 0240)   lacosamide (VIMPAT) IV Stopped (01/29/21 2307)   levETIRAcetam Stopped (01/28/21 1808)   levETIRAcetam 1,000 mg (01/30/21 0634)   PRN Meds:.sodium chloride, sodium chloride, acetaminophen (TYLENOL) oral liquid 160 mg/5 mL **OR** acetaminophen, alteplase, heparin, heparin, lanolin/mineral oil, lidocaine, lidocaine (PF), lidocaine-prilocaine, LORazepam, ondansetron **OR** ondansetron (ZOFRAN) IV, ondansetron, pentafluoroprop-tetrafluoroeth  Diet Orders (From admission, onward)     Start     Ordered   01/24/21 0720  Diet NPO time specified Except for: Sips with Meds  Diet effective now       Question:  Except for  Answer:  Ferrel Logan with Meds   01/24/21 0719            DVT prophylaxis:      Code Status: DNR  Family Communication: no family at bedside   Status is: Inpatient  Remains inpatient appropriate because: Severity of illness  Level of care: Progressive  Consultants:  PCCM Nephrology  Procedures:  As above  Microbiology  none  Antimicrobials: Vancomycin     Objective: Vitals:   01/30/21 0730 01/30/21 0733 01/30/21 0800 01/30/21 0830  BP: 127/73   (!) 150/92  Pulse: 69  73 70  Resp: 16  19 20   Temp:  98.2 F (36.8 C)    TempSrc:  Oral    SpO2: 96%  97% 99%  Weight:        Intake/Output Summary (Last 24 hours) at 01/30/2021 0917 Last data filed at 01/30/2021 0634 Gross per 24 hour  Intake 1222.48 ml  Output --  Net 1222.48 ml   Filed Weights   01/29/21 0500 01/30/21 0500  Weight: 42.4 kg 45.8 kg    Examination: Constitutional: NAD Eyes: no scleral icterus ENMT: Mucous membranes are dry.  Neck: normal, supple Respiratory: Diminished at the bases but overall clear without wheezing or rhonchi Cardiovascular: Regular rate and rhythm, no murmurs / rubs / gallops.  No edema Abdomen: non distended, no tenderness. Bowel sounds positive.  Musculoskeletal: no clubbing / cyanosis.  Skin: no rashes Neurologic: Slow to follow commands but no apparent focal deficits  Data Reviewed: I  have independently reviewed following labs and imaging studies   CBC: Recent Labs  Lab 01/23/21 1016 01/24/21 0134 01/24/21 1331 01/25/21 0209 01/27/21 0443 01/28/21 0331 01/28/21 1650 01/29/21 0203 01/30/21 0206  WBC 3.2*   < > 4.7   < > 5.1 4.7 4.1 5.3 5.2  NEUTROABS 2.4  --  3.8  --   --   --   --   --   --   HGB 10.1*   < > 10.2*   < > 10.9* 9.2* 9.1* 9.7* 9.4*  HCT 34.4*   < > 34.0*   < > 34.3* 29.6* 29.5* 31.1* 30.8*  MCV 104.2*   < > 104.3*   < > 97.4 96.7 97.7 96.3 98.1  PLT 147*   < > 143*   < > 165 134* 126* 122* 117*   < > = values in this interval not displayed.   Basic Metabolic Panel: Recent Labs  Lab 01/24/21 0134 01/24/21 1440 01/25/21 0209 01/26/21 0332 01/27/21 0443 01/28/21 0331 01/29/21 0203 01/29/21 1621 01/30/21 0206  NA  --  139 140 139 132* 131*  --   --   --   K  --  6.0* 5.8* 4.2 4.2 4.4  --   --   --   CL  --  100 103 103 97* 95*  --   --   --   CO2  --  24 24 21* 21* 19*  --   --   --   GLUCOSE  --   125* 96 82 77 81  --   --   --   BUN  --  43* 49* 36* 16 21  --   --   --   CREATININE  --  9.38* 10.46* 8.38* 5.49* 7.06*  --   --   --   CALCIUM  --  8.5* 8.4* 8.2* 8.7* 8.7*  --   --   --   MG  --   --  2.4  --   --  1.9 1.7 1.8 1.8  PHOS   < >  --  8.4* 5.7* 6.3* 8.0* 3.9 3.7 3.7   < > = values in this interval not displayed.   Liver Function Tests: Recent Labs  Lab 01/23/21 1016 01/24/21 0134 01/24/21 1440 01/25/21 0209 01/26/21 0332 01/27/21 0443 01/28/21 0331  AST 17 27 20   --   --   --   --   ALT 10 11 11   --   --   --   --   ALKPHOS 87 95 84  --   --   --   --   BILITOT 0.9 0.9 0.5  --   --   --   --   PROT 7.5 8.4* 8.0  --   --   --   --   ALBUMIN 3.0* 3.3* 3.2* 2.8* 2.8* 2.7* 2.6*   Coagulation Profile: Recent Labs  Lab 01/24/21 1054  INR 1.2   HbA1C: No results for input(s): HGBA1C in the last 72 hours. CBG: Recent Labs  Lab 01/29/21 1522 01/29/21 1937 01/29/21 2329 01/30/21 0331 01/30/21 0732  GLUCAP 99 89 85 93 109*    Recent Results (from the past 240 hour(s))  Resp Panel by RT-PCR (Flu A&B, Covid) Nasopharyngeal Swab     Status: None   Collection Time: 01/23/21  3:04 PM   Specimen: Nasopharyngeal Swab; Nasopharyngeal(NP) swabs in vial transport medium  Result Value Ref Range Status   SARS Coronavirus  2 by RT PCR NEGATIVE NEGATIVE Final    Comment: (NOTE) SARS-CoV-2 target nucleic acids are NOT DETECTED.  The SARS-CoV-2 RNA is generally detectable in upper respiratory specimens during the acute phase of infection. The lowest concentration of SARS-CoV-2 viral copies this assay can detect is 138 copies/mL. A negative result does not preclude SARS-Cov-2 infection and should not be used as the sole basis for treatment or other patient management decisions. A negative result may occur with  improper specimen collection/handling, submission of specimen other than nasopharyngeal swab, presence of viral mutation(s) within the areas targeted by this  assay, and inadequate number of viral copies(<138 copies/mL). A negative result must be combined with clinical observations, patient history, and epidemiological information. The expected result is Negative.  Fact Sheet for Patients:  EntrepreneurPulse.com.au  Fact Sheet for Healthcare Providers:  IncredibleEmployment.be  This test is no t yet approved or cleared by the [place] FDA and  has been authorized for detection and/or diagnosis of SARS-CoV-2 by FDA under an Emergency Use Authorization (EUA). This EUA will remain  in effect (meaning this test can be used) for the duration of the COVID-19 declaration under Section 564(b)(1) of the Act, 21 U.S.C.section 360bbb-3(b)(1), unless the authorization is terminated  or revoked sooner.       Influenza A by PCR NEGATIVE NEGATIVE Final   Influenza B by PCR NEGATIVE NEGATIVE Final    Comment: (NOTE) The Xpert Xpress SARS-CoV-2/FLU/RSV plus assay is intended as an aid in the diagnosis of influenza from Nasopharyngeal swab specimens and should not be used as a sole basis for treatment. Nasal washings and aspirates are unacceptable for Xpert Xpress SARS-CoV-2/FLU/RSV testing.  Fact Sheet for Patients: EntrepreneurPulse.com.au  Fact Sheet for Healthcare Providers: IncredibleEmployment.be  This test is not yet approved or cleared by the [place] FDA and has been authorized for detection and/or diagnosis of SARS-CoV-2 by FDA under an Emergency Use Authorization (EUA). This EUA will remain in effect (meaning this test can be used) for the duration of the COVID-19 declaration under Section 564(b)(1) of the Act, 21 U.S.C. section 360bbb-3(b)(1), unless the authorization is terminated or revoked.  Performed at [facility], [address] [place], [place] [address]   Culture, blood (routine x 2)     Status: None   Collection Time: 01/24/21  1:31 PM    Specimen: BLOOD RIGHT HAND  Result Value Ref Range Status   Specimen Description BLOOD RIGHT HAND  Final   Special Requests   Final    Immunocompromised BOTTLES DRAWN AEROBIC AND ANAEROBIC Blood Culture results may not be optimal due to an inadequate volume of blood received in culture bottles   Culture   Final    NO GROWTH 5 DAYS Performed at [facility], [address], [place], [place] [address]    Report Status 01/29/2021 FINAL  Final  Culture, blood (routine x 2)     Status: None   Collection Time: 01/24/21  1:31 PM   Specimen: BLOOD LEFT HAND  Result Value Ref Range Status   Specimen Description BLOOD LEFT HAND  Final   Special Requests   Final    Immunocompromised BOTTLES DRAWN AEROBIC AND ANAEROBIC Blood Culture adequate volume   Culture   Final    NO GROWTH 5 DAYS Performed at [facility], [address] [place], [place] [address]    Report Status 01/29/2021 FINAL  Final  MRSA Next Gen by PCR, Nasal     Status: None   Collection Time: 01/25/21  4:14 AM  Result Value Ref Range Status   MRSA by PCR Next Gen NOT DETECTED NOT DETECTED Final    Comment: (NOTE) The GeneXpert MRSA Assay (FDA approved for NASAL specimens only), is one component of a comprehensive MRSA colonization surveillance program. It is not intended to diagnose MRSA infection nor to guide or monitor treatment for MRSA infections. Test performance is not FDA approved in patients less than 25 years old. Performed at [facility], [place] [address] [place], [place] [address]   CSF culture w Gram Stain     Status: None   Collection Time: 01/25/21  2:43 PM   Specimen: CSF; Cerebrospinal Fluid  Result Value Ref Range Status   Specimen Description CSF  Final   Special Requests Immunocompromised  Final   Gram Stain   Final    WBC PRESENT, PREDOMINANTLY PMN NO ORGANISMS SEEN CYTOSPIN SMEAR    Culture   Final    NO GROWTH Performed at [facility], [place] [address], [place], [place] [address]     Report Status 01/29/2021 FINAL  Final     Radiology Studies: No results found.   Marzetta Board, MD, PhD [facility]  Between 7 am - 7 pm I am available, please contact me via Amion (for emergencies) or Securechat (non urgent messages)  Between 7 pm - 7 am I am not available, please contact night coverage MD/APP via Amion

## 2021-01-30 NOTE — Progress Notes (Signed)
[facility] NEPHROLOGY PROGRESS NOTE  Assessment/ Plan:  Outpatient HD orders:[name] TTS,EDW 41.5 kg,[place] 2K/2.5 ca,BF 350  DF 500,Time - 180 minutes Access catheter  Medications - out of hectorol and now on calcitriol - 1.25 mcg each tx. Sensipar 90 mg three times a week; Epogen 3600 units each tx, Heparin 1000 units bolus then 500 units per hour Has been on vanc 500 mg each tx (though misses often)   # Acute encephalopathy: EEG with moderate diffuse encephalopathy of nonspecific etiology.  Based on neurology evaluation appears to be consistent with PRES along with imaging showing no evidence of acute CVA.  Per neurology.  She looks more alert today.  # ESRD: She is on outpatient TTS schedule, plan for dialysis today.  Left IJ TDC for the access.  # Anemia: Without overt blood loss, continue ESA with HD.  # CKD-MBD: Resumed  calcitriol and Sensipar for PTH control.  Started calcium acetate for hyperphosphatemia and the repeat lab looks better.  Continue to monitor.  #  Right radiocephalic fistula infection: Status post ligation and debridement on 10/28 with inconsistent outpatient antibiotic therapy due to missing hemodialysis or signing off early.  Restarted back on antibiotic therapy with vancomycin.  # Hypertension: Blood pressures under decent control at this time, will continue to follow closely status post hemodialysis/UF.  Off of Cleviprex drip.  Subjective: Seen and examined.  She developed diarrhea.  Off of Cleviprex drip.  Blood pressure acceptable.  She looks more alert today than yesterday.  Being transferred out of ICU. Objective Vital signs in last 24 hours: Vitals:   01/30/21 0733 01/30/21 0800 01/30/21 0830 01/30/21 0931  BP:   (!) 150/92 (!) 137/95  Pulse:  73 70 73  Resp:  19 20 20   Temp: 98.2 F (36.8 C)   98.2 F (36.8 C)  TempSrc: Oral   Oral  SpO2:  97% 99% 98%  Weight:       Weight change: 3.4 kg  Intake/Output Summary (Last 24 hours)  at 01/30/2021 1010 Last data filed at 01/30/2021 0634 Gross per 24 hour  Intake 1152.05 ml  Output --  Net 1152.05 ml        Labs: Basic Metabolic Panel: Recent Labs  Lab 01/26/21 0332 01/27/21 0443 01/28/21 0331 01/29/21 0203 01/29/21 1621 01/30/21 0206  NA 139 132* 131*  --   --   --   K 4.2 4.2 4.4  --   --   --   CL 103 97* 95*  --   --   --   CO2 21* 21* 19*  --   --   --   GLUCOSE 82 77 81  --   --   --   BUN 36* 16 21  --   --   --   CREATININE 8.38* 5.49* 7.06*  --   --   --   CALCIUM 8.2* 8.7* 8.7*  --   --   --   PHOS 5.7* 6.3* 8.0* 3.9 3.7 3.7    Liver Function Tests: Recent Labs  Lab 01/23/21 1016 01/24/21 0134 01/24/21 1440 01/25/21 0209 01/26/21 0332 01/27/21 0443 01/28/21 0331  AST 17 27 20   --   --   --   --   ALT 10 11 11   --   --   --   --   ALKPHOS 87 95 84  --   --   --   --   BILITOT 0.9 0.9 0.5  --   --   --   --  PROT 7.5 8.4* 8.0  --   --   --   --   ALBUMIN 3.0* 3.3* 3.2*   < > 2.8* 2.7* 2.6*   < > = values in this interval not displayed.    No results for input(s): LIPASE, AMYLASE in the last 168 hours. Recent Labs  Lab 01/23/21 1501 01/24/21 0134  AMMONIA 25 65*    CBC: Recent Labs  Lab 01/23/21 1016 01/24/21 0134 01/24/21 1331 01/25/21 0209 01/27/21 0443 01/28/21 0331 01/28/21 1650 01/29/21 0203 01/30/21 0206  WBC 3.2*   < > 4.7   < > 5.1 4.7 4.1 5.3 5.2  NEUTROABS 2.4  --  3.8  --   --   --   --   --   --   HGB 10.1*   < > 10.2*   < > 10.9* 9.2* 9.1* 9.7* 9.4*  HCT 34.4*   < > 34.0*   < > 34.3* 29.6* 29.5* 31.1* 30.8*  MCV 104.2*   < > 104.3*   < > 97.4 96.7 97.7 96.3 98.1  PLT 147*   < > 143*   < > 165 134* 126* 122* 117*   < > = values in this interval not displayed.    Cardiac Enzymes: No results for input(s): CKTOTAL, CKMB, CKMBINDEX, TROPONINI in the last 168 hours. CBG: Recent Labs  Lab 01/29/21 1522 01/29/21 1937 01/29/21 2329 01/30/21 0331 01/30/21 0732  GLUCAP 99 89 85 93 109*     Iron  Studies: No results for input(s): IRON, TIBC, TRANSFERRIN, FERRITIN in the last 72 hours. Studies/Results: DG Abd 1 View  Result Date: 01/28/2021 CLINICAL DATA:  Feeding tube placement EXAM: ABDOMEN - 1 VIEW COMPARISON:  Portable exam 1018 hours compared to 01/27/2021 FINDINGS: Tip of feeding tube projects over distal gastric antrum/pylorus. Nonobstructive bowel gas pattern. Bones demineralized. Lung bases clear. IMPRESSION: Tip of feeding tube projects over distal gastric antrum/pylorus. Electronically Signed   By: Lavonia Dana M.D.   On: 01/28/2021 13:13    Medications: Infusions:  sodium chloride     sodium chloride     feeding supplement (OSMOLITE 1.2 CAL) 1,000 mL (01/30/21 0940)   lacosamide (VIMPAT) IV Stopped (01/29/21 2307)   levETIRAcetam Stopped (01/28/21 1808)   levETIRAcetam 1,000 mg (01/30/21 7371)    Scheduled Medications:  amLODipine  10 mg Oral Daily   apixaban  2.5 mg Per Tube BID   calcitRIOL  1.25 mcg Per Tube Q T,Th,Sa-HD   calcium acetate (Phos Binder)  1,334 mg Per Tube TID WC   chlorhexidine  15 mL Mouth Rinse BID   Chlorhexidine Gluconate Cloth  6 each Topical Q0600   Chlorhexidine Gluconate Cloth  6 each Topical Q0600   cinacalcet  90 mg Oral Q T,Th,Sa-HD   clopidogrel  75 mg Per Tube Q breakfast   collagenase  1 application Topical Daily   darbepoetin (ARANESP) injection - DIALYSIS  40 mcg Intravenous Q Tue-HD   dolutegravir  50 mg Per Tube q1600   feeding supplement (PROSource TF)  45 mL Per Tube Daily   hydrALAZINE  50 mg Oral Q8H   lamiVUDine  50 mg Per Tube Daily   lidocaine-prilocaine  1 application Topical Q T,Th,Sa-HD   mouth rinse  15 mL Mouth Rinse q12n4p   metoprolol tartrate  25 mg Per Tube BID   mirtazapine  15 mg Per Tube QHS   pantoprazole (PROTONIX) IV  40 mg Intravenous QHS   polyethylene glycol  17 g Per Tube  Daily   senna  2 tablet Per Tube QPM   thiamine injection  100 mg Intravenous Daily   vancomycin  500 mg Intravenous Q  T,Th,Sa-HD   venlafaxine  75 mg Per Tube BID   Zidovudine  100 mg Per Tube Q8H    have reviewed scheduled and prn medications.  Physical Exam: General: Ill-looking female, sitting on chair, alert and following commands Heart:RRR, s1s2 nl Lungs: Clear b/l, no crackle Abdomen:soft, Non-tender, non-distended Extremities:No edema Dialysis Access: Left IJ TDC in place.  Braxon Suder Prasad Kawanda Drumheller 01/30/2021,10:10 AM  LOS: 7 days

## 2021-01-30 NOTE — Progress Notes (Signed)
PT arrived to 5w 12 alert and oriented x2, heparin drip running at 8.5 cortrack in place,pt placed, on telemetry and CCMD notified. New continuous tube feeding started. Pt oriented to room. Will continue to monitor

## 2021-01-30 NOTE — Progress Notes (Signed)
This chaplain is present for F/U spiritual care. The Pt. is awake and communicates with the chaplain in phrases, with the ability to repeat, if the chaplain needs assistance with clarity.   The chaplain understands the Pt. is experiencing pain with her Coretrack and the "sore on her butt". The chaplain updated the Pt. RN-Erin.  The chaplain understands the Pt. is anticipating HD either today or tomorrow and has not talked to her daughter. The chaplain was not able to answer the Pt. questions about her seizure activity. The Pt. attempted to compare it to a friend's experience with seizures.   The Pt. shares her birthday is on [birth year] and strawberry cake with vanilla ice cream is her favorite. The Pt. and chaplain prayed together for each other before agreeing to another visit.  Chaplain Sallyanne Kuster [phone]

## 2021-01-31 ENCOUNTER — Inpatient Hospital Stay (HOSPITAL_COMMUNITY): Payer: Medicare Other

## 2021-01-31 DIAGNOSIS — I6783 Posterior reversible encephalopathy syndrome: Secondary | ICD-10-CM | POA: Diagnosis not present

## 2021-01-31 DIAGNOSIS — G9340 Encephalopathy, unspecified: Secondary | ICD-10-CM | POA: Diagnosis not present

## 2021-01-31 DIAGNOSIS — Z515 Encounter for palliative care: Secondary | ICD-10-CM | POA: Diagnosis not present

## 2021-01-31 DIAGNOSIS — N186 End stage renal disease: Secondary | ICD-10-CM | POA: Diagnosis not present

## 2021-01-31 DIAGNOSIS — I10 Essential (primary) hypertension: Secondary | ICD-10-CM | POA: Diagnosis not present

## 2021-01-31 LAB — GLUCOSE, CAPILLARY
Glucose-Capillary: 100 mg/dL — ABNORMAL HIGH (ref 70–99)
Glucose-Capillary: 116 mg/dL — ABNORMAL HIGH (ref 70–99)
Glucose-Capillary: 80 mg/dL (ref 70–99)
Glucose-Capillary: 87 mg/dL (ref 70–99)
Glucose-Capillary: 98 mg/dL (ref 70–99)
Glucose-Capillary: 99 mg/dL (ref 70–99)

## 2021-01-31 LAB — COMPREHENSIVE METABOLIC PANEL
ALT: 15 U/L (ref 0–44)
AST: 25 U/L (ref 15–41)
Albumin: 2.7 g/dL — ABNORMAL LOW (ref 3.5–5.0)
Alkaline Phosphatase: 73 U/L (ref 38–126)
Anion gap: 7 (ref 5–15)
BUN: 5 mg/dL — ABNORMAL LOW (ref 8–23)
CO2: 26 mmol/L (ref 22–32)
Calcium: 8.5 mg/dL — ABNORMAL LOW (ref 8.9–10.3)
Chloride: 100 mmol/L (ref 98–111)
Creatinine, Ser: 2.02 mg/dL — ABNORMAL HIGH (ref 0.44–1.00)
GFR, Estimated: 27 mL/min — ABNORMAL LOW (ref >60)
Glucose, Bld: 102 mg/dL — ABNORMAL HIGH (ref 70–99)
Potassium: 3.2 mmol/L — ABNORMAL LOW (ref 3.5–5.1)
Sodium: 133 mmol/L — ABNORMAL LOW (ref 135–145)
Total Bilirubin: 0.3 mg/dL (ref 0.3–1.2)
Total Protein: 7.1 g/dL (ref 6.5–8.1)

## 2021-01-31 LAB — CBC
HCT: 30 % — ABNORMAL LOW (ref 36.0–46.0)
Hemoglobin: 9.1 g/dL — ABNORMAL LOW (ref 12.0–15.0)
MCH: 29.9 pg (ref 26.0–34.0)
MCHC: 30.3 g/dL (ref 30.0–36.0)
MCV: 98.7 fL (ref 80.0–100.0)
Platelets: 114 10*3/uL — ABNORMAL LOW (ref 150–400)
RBC: 3.04 MIL/uL — ABNORMAL LOW (ref 3.87–5.11)
RDW: 18.3 % — ABNORMAL HIGH (ref 11.5–15.5)
WBC: 4.3 10*3/uL (ref 4.0–10.5)
nRBC: 0 % (ref 0.0–0.2)

## 2021-01-31 LAB — POTASSIUM: Potassium: 3.4 mmol/L — ABNORMAL LOW (ref 3.5–5.1)

## 2021-01-31 LAB — MAGNESIUM: Magnesium: 1.9 mg/dL (ref 1.7–2.4)

## 2021-01-31 IMAGING — DX DG CHEST 1V PORT
1 series · 1 of 1 positions shown · non-contrast
Comparison: [DATE]

CLINICAL DATA: Dyspnea

EXAM:
PORTABLE CHEST 1 VIEW

[chest ap]
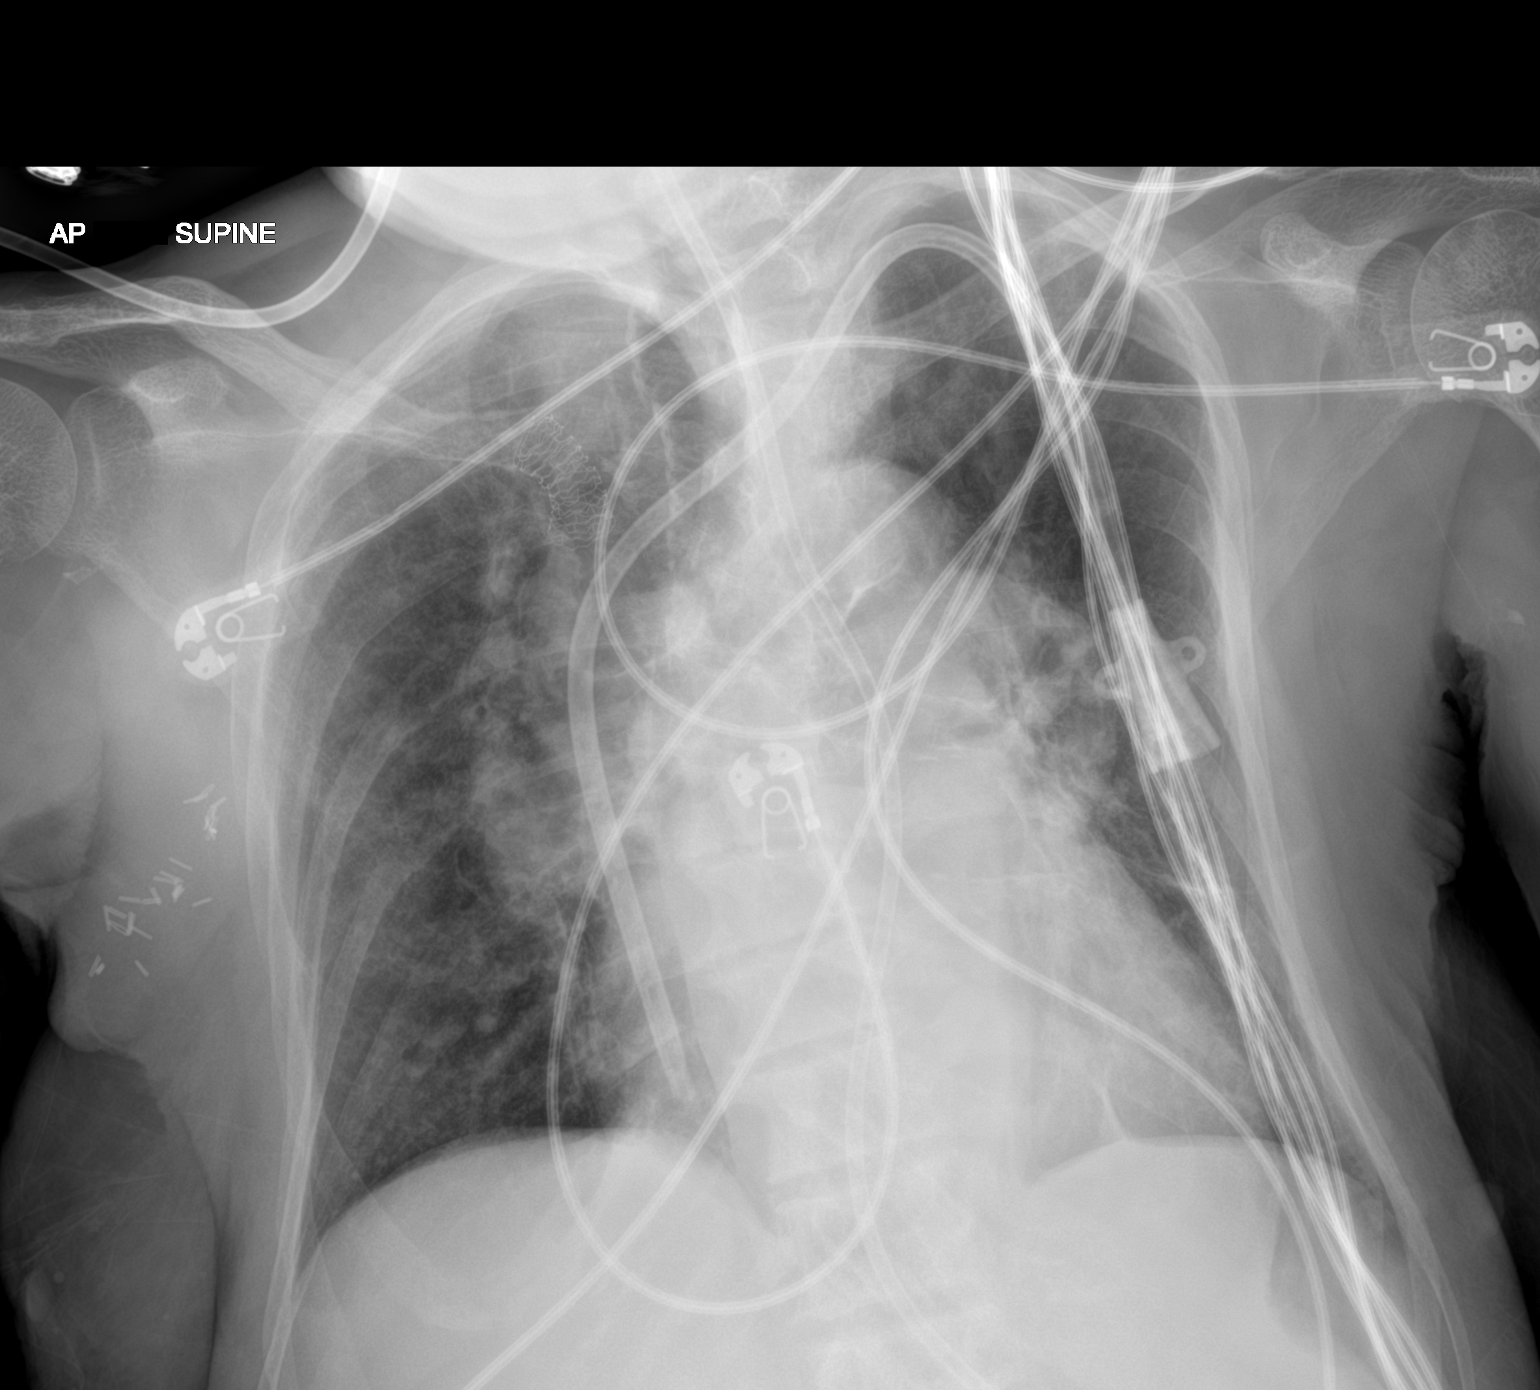

[1 of 1 positions shown; findings below may reference images not displayed]

FINDINGS: Cardiac shadow is stable. Aortic calcifications are again seen. Left
jugular dialysis catheter is noted in satisfactory position. Feeding
catheter extends into the stomach. Changes of prior vascular
stenting are noted on the right stable from the prior exam. Central
vascular prominence is noted. Some mild interstitial changes are
noted increased from the prior exam. No focal confluent infiltrate
or effusion is seen. No bony abnormality is noted.
IMPRESSION: Changes of mild CHF.

No other focal abnormality is seen.

## 2021-01-31 MED ORDER — CHLORHEXIDINE GLUCONATE CLOTH 2 % EX PADS
6.0000 | MEDICATED_PAD | Freq: Every day | CUTANEOUS | Status: DC
  Administered 2021-01-31 – 2021-02-07 (×8): 6 via TOPICAL

## 2021-01-31 MED ORDER — HYDRALAZINE HCL 50 MG PO TABS
100.0000 mg | ORAL_TABLET | Freq: Three times a day (TID) | ORAL | Status: DC
  Administered 2021-01-31 – 2021-02-03 (×8): 100 mg via ORAL
  Filled 2021-01-31 (×9): qty 2

## 2021-01-31 MED ORDER — NYSTATIN 100000 UNIT/ML MT SUSP
5.0000 mL | Freq: Four times a day (QID) | OROMUCOSAL | Status: DC
  Administered 2021-01-31 – 2021-02-10 (×32): 500000 [IU] via ORAL
  Filled 2021-01-31 (×32): qty 5

## 2021-01-31 NOTE — Progress Notes (Signed)
Palliative Medicine Inpatient Follow Up Note  HPI: [name] is a 70 year old female with PMH significant for ESRD on HD TTS, HIV, atrial flutter on Eliquis, anemia of chronic disease, chronic pain,GERD,  and recent infection involving right AV fistula status post washout and currently on IV vancomycin with wound vac. She was transferred to [facility] from [facility] ER where she was admitted after syncope while on hemodialysis with a recent development of intermittent confusion. She had a  seizure episode and was MRI raised possibility of PRES. Other concerns have been hypertension, elevated TSH and elevated T4 with no known hyperthyroidism history.   Received hemodialysis last night. She went into afib with RVR, started on amiodarone with heart rate control. LP ruled out acute meningitis/encephalitis. 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 Mental status is improving.    Per prior palliative care notes [name] had been fairly adamant in the past about not wishing for supplemental nutrition via feeding tube.  She had been educated on ways to increase her nutritional state though unfortunately she had been readmitted and remains to have insufficient nutritional support.  Today's Discussion (01/31/2021): Chart reviewed.   I met with [name] at bedside this afternoon she expresses that presently she has a pretty notable headache though she just received medication for this so anticipates alleviation of discomfort sooner than later.  We reviewed that she has had a complicated hospital stay over the past 8 days.  Discussed the plan for additional speech therapy support.  I shared that ideally we would be able to meet with [name] and her daughter at the same time so that further goals of care conversations may be held especially as it relates to nutrition.  [name] expresses that she does not know where her daughter is right now but is amenable to my calling her  to try to set up a meeting date and time.  Otherwise [name] requests time to rest for her headache to clear.  I called patient's daughter, [name] this afternoon.  [name] expresses that she has returned from her trip.  She shares that she will try to visit this weekend though is having some motor vehicle difficulties however she will make every effort she can to see her mother.  I expressed the importance of [place] meeting together for further conversations.  We have loosely plan to meet this weekend if [name]'s car starts working.  Objective Assessment: Vital Signs Vitals:   01/31/21 1000 01/31/21 1100  BP: (!) 148/83   Pulse: 78 69  Resp: (!) 29 (!) 37  Temp:    SpO2: 97% 97%    Intake/Output Summary (Last 24 hours) at 01/31/2021 1222 Last data filed at 01/31/2021 1100 Gross per 24 hour  Intake 2079.02 ml  Output 2000 ml  Net 79.02 ml    Last Weight  Most recent update: 01/31/2021  6:51 AM    Weight  43.6 kg (96 lb 1.9 oz)            SUMMARY OF RECOMMENDATIONS   DNAR/DNI  Plan to meet with patient and her daughter over the weekend for additional conversations circulating around the topic of nutrition  Ongoing palliative care support  Code Status/Advance Care Planning: DNAR/DNI Limited additional interventions: use medical treatment, IV fluids and cardiac monitoring as indicated May use less invasive airway support such as BiPAP or CPAP. Also provide comfort measures Transfer to hospital if indicated. No feeding tube   Symptom Management:  Pain-  acetaminophen prn Nausea- ondansetron prn Oral Thrush_ Nystatin QID   Palliative Prophylaxis:  GI prophylaxis- pantoprazole Constipation- senna prn   Additional Recommendations (Limitations, Scope, Preferences): Full scope of care other than DNR   Psycho-social/Spiritual:  Desire for further Chaplaincy support: yes, Christian Additional Recommendations: Follow for symptom management and further discussion once  Daughter is back in  town from vacation on 11/30   Discharge Planning: TBD  Time Spent: 35 minutes Greater than 50% of the time was spent in counseling and coordination of care ______________________________________________________________________________________ [name], [place] Team Team Cell Phone: [phone] Please utilize secure chat with additional questions, if there is no response within 30 minutes please call the above phone number  Palliative Medicine Team providers are available by phone from 7am to 7pm daily and can be reached through the team cell phone.  Should this patient require assistance outside of these hours, please call the patient's attending physician.

## 2021-01-31 NOTE — Progress Notes (Signed)
Pharmacy Antibiotic Note Pharmacy Consult for vancomycin  Indication:  MRSA infection of AV fistula    Weight: 43.6 kg (96 lb 1.9 oz)  Temp (24hrs), Avg:98.2 F (36.8 C), Min:97.8 F (36.6 C), Max:98.6 F (37 C)  Recent Labs  Lab 01/26/21 0332 01/27/21 0443 01/28/21 0331 01/28/21 1650 01/29/21 0203 01/30/21 0206 01/30/21 1749 01/31/21 0246  WBC  --  5.1 4.7 4.1 5.3 5.2  --  4.3  CREATININE 8.38* 5.49* 7.06*  --   --   --  5.41* 2.02*  VANCORANDOM  --  18  --   --   --   --   --   --     Estimated Creatinine Clearance: 18.6 mL/min (A) (by C-G formula based on SCr of 2.02 mg/dL (H)).    Allergies  Allergen Reactions   Lactose Intolerance (Gi) Other (See Comments)    On MAR   Latex Rash and Itching   Penicillins Rash and Swelling    Has patient had a PCN reaction causing immediate rash, facial/tongue/throat swelling, SOB or lightheadedness with hypotension: No Has patient had a PCN reaction causing severe rash involving mucus membranes or skin necrosis: No Has patient had a PCN reaction that required hospitalization No Has patient had a PCN reaction occurring within the last 10 years: No If all of the above answers are "NO", then may proceed with Cephalosporin use.      Medications:  Medications Prior to Admission  Medication Sig Dispense Refill Last Dose   Amino Acids-Protein Hydrolys (PRO-STAT 64 PO) Take by mouth. Give 38ml by mouth in the morning for wound healing   01/23/2021   apixaban (ELIQUIS) 2.5 MG TABS tablet Take 1 tablet (2.5 mg total) by mouth 2 (two) times daily. 60 tablet 0 01/23/2021 at 0800   baclofen (LIORESAL) 10 MG tablet Take 10 mg by mouth daily as needed for muscle spasms.   Past Month   clopidogrel (PLAVIX) 75 MG tablet Take 1 tablet (75 mg total) by mouth daily with breakfast. 30 tablet  Past Month   collagenase (SANTYL) ointment Apply 1 application topically daily. Apply to sacrum topically every day shift for wound healing       cyanocobalamin (,VITAMIN B-12,) 1000 MCG/ML injection Inject 1,000 mcg into the muscle every 30 (thirty) days. On the 28th of every month   Past Month   dolutegravir (TIVICAY) 50 MG tablet Take 50 mg by mouth daily at 4 PM.   Past Week   Emollient (MINERIN) LOTN Apply 1 application topically every 12 (twelve) hours as needed (dry skin). Apply to feet and ankles      fentaNYL (DURAGESIC) 25 MCG/HR Place 1 patch onto the skin every 3 (three) days. 3 patch 0 Past Week   lamivudine (EPIVIR) 100 MG tablet Take 50 mg by mouth daily. (0800)   01/23/2021   Lidocaine HCl 4 % CREA Apply 1 application topically every 12 (twelve) hours as needed (pain).      lidocaine-prilocaine (EMLA) cream Apply 1 application topically See admin instructions. (0900) Apply to fistula site one hour before dialysis on Tuesday, Thursday, and Saturday.   Past Month   megestrol (MEGACE) 400 MG/10ML suspension Take 400 mg by mouth daily.   01/23/2021   melatonin 3 MG TABS tablet Take 3 mg by mouth at bedtime. (2000)   Past Week   methadone (DOLOPHINE) 5 MG tablet Take 0.5 tablets (2.5 mg total) by mouth daily at 8 pm. (2000) 3 tablet 0 Past Week  metoprolol tartrate (LOPRESSOR) 25 MG tablet Take 1 tablet (25 mg total) by mouth 2 (two) times daily. 60 tablet 0 01/23/2021 at 0800   mirtazapine (REMERON) 15 MG tablet Take 15 mg by mouth at bedtime.   Past Month   nitroGLYCERIN (NITROSTAT) 0.4 MG SL tablet Place 0.4 mg under the tongue every 5 (five) minutes x 3 doses as needed for chest pain.    unk at unk   ondansetron (ZOFRAN ODT) 8 MG disintegrating tablet Take 1 tablet (8 mg total) by mouth every 8 (eight) hours as needed for nausea or vomiting. 30 tablet 3 Past Month   Oxycodone HCl 10 MG TABS Take 1 tablet (10 mg total) by mouth every 12 (twelve) hours as needed (severe pain). 4 tablet 0 Past Week   pantoprazole (PROTONIX) 40 MG tablet Take 1 tablet (40 mg total) by mouth daily before breakfast. 30 tablet 3 01/23/2021    promethazine (PHENERGAN) 12.5 MG tablet Take 12.5 mg by mouth See admin instructions. Take 1 tablet (12.5 mg) by mouth in the morning every Tuesday, Thursday and Saturday for nausea/vomiting and every 6 hours as needed for n/v   01/23/2021   senna (SENOKOT) 8.6 MG tablet Take 2 tablets by mouth every evening. (2100)   Past Week   vancomycin (VANCOCIN) 500-5 MG/100ML-% IVPB Inject 100 mLs (500 mg total) into the vein Every Tuesday,Thursday,and Saturday with dialysis. 100 mL 0 01/23/2021   venlafaxine XR (EFFEXOR-XR) 150 MG 24 hr capsule Take 150 mg by mouth daily with breakfast. (0900)   01/23/2021   vitamin C (ASCORBIC ACID) 500 MG tablet Take 500 mg by mouth 2 (two) times daily.   01/23/2021   zidovudine (RETROVIR) 100 MG capsule Take 100 mg by mouth 3 (three) times daily. (0900, 1300, & 2100)   01/23/2021   Zinc Sulfate (ZINC-220 PO) Take 1 tablet by mouth daily.   01/23/2021   Multiple Vitamin (MULTIVITAMIN WITH MINERALS) TABS tablet Take 1 tablet by mouth at bedtime. (2000)      Assessment:  70yo female admitted on 01/23/2021 with MRSA infection of right AV fistula on vancomycin PTA with plan to continue to 12/14 (started 10/28, for 6 weeks).  Patient gets dialysis TTSa but has received inconsistent outpatient antibiotic therapy due to missing hemodialysis or ending session early.  Pharmacy was consulted on 01/27/21 for vancomycin dosing.  Continues on Vancomycin 500 mg IV TTS with HD.   Continues on scheduled TTS HD, next HD due 12/3 Saturday.   Antimicrobials this admission: Vancomycin 10/28  >> 12/14    Vanc levels/ Dose adjustments this admission: 11/28 Vanc R-18 (on vancomycin 500 mg with TTSa   Microbiology results: 11/25 BCx x2: negative 11/26 MRSA PCR: neg  11/26 CSF cx: negative 10/28 Arm abscess + MRSA   Goal of Therapy:  Pre HD vancomycin random level = 15-25 mcg/ml   Plan:  Continue Vancomycin 500mg  Post-HD on TTSa F/u HD schedule, adjust doses as needed F/u Weekly  vancomycin level on Mondays- next 12/5 End date for IV Vanc planned 02/12/21.   Thank you for allowing pharmacy to be a part of this patient's care. Nicole Cella, RPh Clinical Pharmacist [phone] 01/31/2021 12:34 PM  Please check AMION for all [place] phone numbers After 10:00 PM, call [place] [phone]

## 2021-01-31 NOTE — Progress Notes (Signed)
PROGRESS NOTE  [name] PJA:[id number] DOB: [birth year] DOA: 01/23/2021 PCP: Hilbert Corrigan, MD   LOS: 8 days   Brief Narrative / Interim history: 70 year old female who has a history of a flutter on Eliquis, pulmonary hypertension, HIV, GERD, ESRD on TTS HD, fistula infection on vent treatment with HD as an outpatient but not consistently administered due to early termination of her HD, was brought to [facility] on 11/24 due to a syncopal episode while undergoing HD.  She also had intermittent confusion at that time.  After admission, on 11/25 became hypertensive, had a seizure and neurology was consulted.  An MRI of the brain was concerning for press.  She was started on Cardene drip and she was transferred to [facility] for ICU management and continuous EEG.  Her mental status continues to improve.  On 11/28 failed bedside speech eval and core track was placed.  On 11/29 her continuous EEG has been discontinued, and with continued improvement she was transferred to the hospitalist service on 12/1.  Nephrology also following for her dialysis needs.  Subjective / 24h Interval events: She is much more alert today.  No shortness of breath, no chest pain, no abdominal pain, no nausea or vomiting.  She is bothered by the core track  Assessment & Plan: Principal Problem:   PRES (posterior reversible encephalopathy syndrome) Active Problems:   Human immunodeficiency virus (HIV) disease (HCC)   Gastroesophageal reflux disease   Atrial fibrillation (HCC)   ESRD on dialysis (HCC)   Hyperkalemia   Macrocytic anemia   History of stroke   Seizure ([place])   Hypertensive emergency   MRSA infection   Acute delirium   Thrombocytopenia (HCC)   Hyponatremia   Principal Problem Acute encephalopathy with press, hypertensive emergency -patient had a seizure episode in the setting of hypertension. MRI showed cortical and subcortical edema in the occipital parietal lobes  bilaterally.  She was subsequently admitted to the ICU, improved and transferred to the hospitalist service on 12/1. Current antihypertensive regimen includes amlodipine 10 mg daily, hydralazine 50 mg every 8 hours, metoprolol 25 mg twice daily.  Encephalopathy is starting to resolve.  Blood pressure on the high side today, increase hydralazine dose to 100 every 8  BP (!) 167/72 (BP Location: Left Leg)   Pulse 80   Temp 98.2 F (36.8 C) (Axillary)   Resp 17   Wt 43.6 kg   LMP  (LMP Unknown)   SpO2 96%   BMI 17.30 kg/m   Active Problems Seizure in the setting of press -neurology consulted and following.  EEG initially showed nonconvulsive status epilepticus.  She underwent continuous EEG monitoring.  Currently she is on Vimpat 150 mg every 12 as well as Keppra 1 g daily and an additional 250 mg with each HD.  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  ID was consulted and recommending vancomycin with dialysis for total of 6 weeks with a last day 02/12/2021.  Continue vancomycin for now.  There are some reports that she might have missed dialysis and antibiotics however patient denies, and overall this is not clear.  Discussed with nephrology, they are not sure either  Paroxysmal atrial fibrillation with rapid ventricular response -Cardiac monitoring, improved, rate controlled currently, on metoprolol.  She was initially maintained on heparin, now transition to  Eliquis  ESRD, on HD-nephrology following   HIV -On antiretrovirals   Anemia of chronic disease, thrombocytopenia-in the setting of renal disease, monitor.  No bleeding.  Severe protein calorie malnutrition -Has core track in place, continue supplementation -Appreciate speech therapy, she will get modified barium swallow today, follow results  GERD -PPI    Hyponatremia-volume managed with dialysis  Scheduled Meds:  amLODipine  10 mg Oral Daily   apixaban  2.5 mg Per Tube BID   calcitRIOL  1.25 mcg Per Tube Q T,Th,Sa-HD   calcium acetate (Phos Binder)  1,334 mg Per Tube TID WC   chlorhexidine  15 mL Mouth Rinse BID   Chlorhexidine Gluconate Cloth  6 each Topical Q0600   Chlorhexidine Gluconate Cloth  6 each Topical Q0600   cinacalcet  90 mg Oral Q T,Th,Sa-HD   clopidogrel  75 mg Per Tube Q breakfast   collagenase  1 application Topical Daily   darbepoetin (ARANESP) injection - DIALYSIS  40 mcg Intravenous Q Tue-HD   dolutegravir  50 mg Per Tube q1600   feeding supplement (PROSource TF)  45 mL Per Tube Daily   hydrALAZINE  50 mg Oral Q8H   lamiVUDine  50 mg Per Tube Daily   lidocaine-prilocaine  1 application Topical Q T,Th,Sa-HD   mouth rinse  15 mL Mouth Rinse q12n4p   metoprolol tartrate  25 mg Per Tube BID   mirtazapine  15 mg Per Tube QHS   pantoprazole (PROTONIX) IV  40 mg Intravenous QHS   polyethylene glycol  17 g Per Tube Daily   senna  2 tablet Per Tube QPM   thiamine injection  100 mg Intravenous Daily   vancomycin  500 mg Intravenous Q T,Th,Sa-HD   venlafaxine  75 mg Per Tube BID   Zidovudine  100 mg Per Tube Q8H   Continuous Infusions:  feeding supplement (OSMOLITE 1.2 CAL) 1,000 mL (01/30/21 0940)   lacosamide (VIMPAT) IV Stopped (01/31/21 0000)   levETIRAcetam 250 mg (01/31/21 0027)   levETIRAcetam 1,000 mg (01/31/21 0753)   PRN Meds:.acetaminophen (TYLENOL) oral liquid 160 mg/5 mL **OR** acetaminophen, lanolin/mineral oil, lidocaine, LORazepam, ondansetron **OR** ondansetron (ZOFRAN) IV, ondansetron  Diet Orders (From admission, onward)     Start     Ordered   01/24/21 0720  Diet NPO time specified Except for: Sips with Meds  Diet effective now       Question:  Except for  Answer:  Sips with Meds   01/24/21 0719            DVT prophylaxis: apixaban (ELIQUIS) tablet 2.5 mg Start: 01/30/21 1045 apixaban  (ELIQUIS) tablet 2.5 mg     Code Status: DNR  Family Communication: no family at bedside   Status is: Inpatient  Remains inpatient appropriate because: Severity of illness  Level of care: Progressive  Consultants:  PCCM Nephrology  Procedures:  As above  Microbiology  none  Antimicrobials: Vancomycin    Objective: Vitals:   01/30/21 2310 01/31/21 0400 01/31/21 0500 01/31/21 0801  BP: (!) 141/74 (!) 142/78  (!) 167/72  Pulse: 88 88  80  Resp: 16 18 19 17   Temp: 98.1 F (36.7 C) 98.5 F (36.9 C)  98.2 F (36.8 C)  TempSrc: Oral Oral  Axillary  SpO2: 96% 97%  96%  Weight: 43.3 kg  43.6 kg     Intake/Output Summary (Last 24 hours) at 01/31/2021 1007 Last data filed at 01/30/2021 2155 Gross per 24 hour  Intake 892.02 ml  Output 2000  ml  Net -1107.98 ml    Filed Weights   01/30/21 2155 01/30/21 2310 01/31/21 0500  Weight: 43.3 kg 43.3 kg 43.6 kg    Examination: Constitutional: No distress, in bed Eyes: Anicteric ENMT: Dry mucous membranes Neck: normal, supple Respiratory: Clear bilaterally, no wheezing or rhonchi, moves air well Cardiovascular: Regular rate and rhythm, no murmurs, no peripheral edema Abdomen: Soft, NT, ND, positive bowel sounds Musculoskeletal: no clubbing / cyanosis.  Skin: No rashes seen Neurologic: No focal deficits  Data Reviewed: I have independently reviewed following labs and imaging studies   CBC: Recent Labs  Lab 01/24/21 1331 01/25/21 0209 01/28/21 0331 01/28/21 1650 01/29/21 0203 01/30/21 0206 01/31/21 0246  WBC 4.7   < > 4.7 4.1 5.3 5.2 4.3  NEUTROABS 3.8  --   --   --   --   --   --   HGB 10.2*   < > 9.2* 9.1* 9.7* 9.4* 9.1*  HCT 34.0*   < > 29.6* 29.5* 31.1* 30.8* 30.0*  MCV 104.3*   < > 96.7 97.7 96.3 98.1 98.7  PLT 143*   < > 134* 126* 122* 117* 114*   < > = values in this interval not displayed.    Basic Metabolic Panel: Recent Labs  Lab 01/25/21 0209 01/26/21 0332 01/27/21 0443 01/28/21 0331  01/29/21 0203 01/29/21 1621 01/30/21 0206 01/30/21 1749 01/31/21 0246  NA 140 139 132* 131*  --   --   --  128* 133*  K 5.8* 4.2 4.2 4.4  --   --   --  4.2 3.2*  CL 103 103 97* 95*  --   --   --  93* 100  CO2 24 21* 21* 19*  --   --   --  24 26  GLUCOSE 96 82 77 81  --   --   --  92 102*  BUN 49* 36* 16 21  --   --   --  31* 5*  CREATININE 10.46* 8.38* 5.49* 7.06*  --   --   --  5.41* 2.02*  CALCIUM 8.4* 8.2* 8.7* 8.7*  --   --   --  7.7* 8.5*  MG 2.4  --   --  1.9 1.7 1.8 1.8  --   --   PHOS 8.4* 5.7* 6.3* 8.0* 3.9 3.7 3.7 3.2  --     Liver Function Tests: Recent Labs  Lab 01/24/21 1440 01/25/21 0209 01/26/21 0332 01/27/21 0443 01/28/21 0331 01/30/21 1749 01/31/21 0246  AST 20  --   --   --   --   --  25  ALT 11  --   --   --   --   --  15  ALKPHOS 84  --   --   --   --   --  73  BILITOT 0.5  --   --   --   --   --  0.3  PROT 8.0  --   --   --   --   --  7.1  ALBUMIN 3.2*   < > 2.8* 2.7* 2.6* 2.5* 2.7*   < > = values in this interval not displayed.    Coagulation Profile: Recent Labs  Lab 01/24/21 1054  INR 1.2    HbA1C: No results for input(s): HGBA1C in the last 72 hours. CBG: Recent Labs  Lab 01/30/21 1132 01/30/21 1607 01/31/21 0036 01/31/21 0544 01/31/21 0800  GLUCAP 114* 97 100* 99 98  Recent Results (from the past 240 hour(s))  Resp Panel by RT-PCR (Flu A&B, Covid) Nasopharyngeal Swab     Status: None   Collection Time: 01/23/21  3:04 PM   Specimen: Nasopharyngeal Swab; Nasopharyngeal(NP) swabs in vial transport medium  Result Value Ref Range Status   SARS Coronavirus 2 by RT PCR NEGATIVE NEGATIVE Final    Comment: (NOTE) SARS-CoV-2 target nucleic acids are NOT DETECTED.  The SARS-CoV-2 RNA is generally detectable in upper respiratory specimens during the acute phase of infection. The lowest concentration of SARS-CoV-2 viral copies this assay can detect is 138 copies/mL. A negative result does not preclude SARS-Cov-2 infection and  should not be used as the sole basis for treatment or other patient management decisions. A negative result may occur with  improper specimen collection/handling, submission of specimen other than nasopharyngeal swab, presence of viral mutation(s) within the areas targeted by this assay, and inadequate number of viral copies(<138 copies/mL). A negative result must be combined with clinical observations, patient history, and epidemiological information. The expected result is Negative.  Fact Sheet for Patients:  EntrepreneurPulse.com.au  Fact Sheet for Healthcare Providers:  IncredibleEmployment.be  This test is no t yet approved or cleared by the [place] FDA and  has been authorized for detection and/or diagnosis of SARS-CoV-2 by FDA under an Emergency Use Authorization (EUA). This EUA will remain  in effect (meaning this test can be used) for the duration of the COVID-19 declaration under Section 564(b)(1) of the Act, 21 U.S.C.section 360bbb-3(b)(1), unless the authorization is terminated  or revoked sooner.       Influenza A by PCR NEGATIVE NEGATIVE Final   Influenza B by PCR NEGATIVE NEGATIVE Final    Comment: (NOTE) The Xpert Xpress SARS-CoV-2/FLU/RSV plus assay is intended as an aid in the diagnosis of influenza from Nasopharyngeal swab specimens and should not be used as a sole basis for treatment. Nasal washings and aspirates are unacceptable for Xpert Xpress SARS-CoV-2/FLU/RSV testing.  Fact Sheet for Patients: EntrepreneurPulse.com.au  Fact Sheet for Healthcare Providers: IncredibleEmployment.be  This test is not yet approved or cleared by the [place] FDA and has been authorized for detection and/or diagnosis of SARS-CoV-2 by FDA under an Emergency Use Authorization (EUA). This EUA will remain in effect (meaning this test can be used) for the duration of the COVID-19 declaration  under Section 564(b)(1) of the Act, 21 U.S.C. section 360bbb-3(b)(1), unless the authorization is terminated or revoked.  Performed at [facility], [address] [place], [place] [address]   Culture, blood (routine x 2)     Status: None   Collection Time: 01/24/21  1:31 PM   Specimen: BLOOD RIGHT HAND  Result Value Ref Range Status   Specimen Description BLOOD RIGHT HAND  Final   Special Requests   Final    Immunocompromised BOTTLES DRAWN AEROBIC AND ANAEROBIC Blood Culture results may not be optimal due to an inadequate volume of blood received in culture bottles   Culture   Final    NO GROWTH 5 DAYS Performed at [facility], [address], [place], [place] [address]    Report Status 01/29/2021 FINAL  Final  Culture, blood (routine x 2)     Status: None   Collection Time: 01/24/21  1:31 PM   Specimen: BLOOD LEFT HAND  Result Value Ref Range Status   Specimen Description BLOOD LEFT HAND  Final   Special Requests   Final    Immunocompromised BOTTLES DRAWN AEROBIC AND ANAEROBIC Blood Culture adequate  volume   Culture   Final    NO GROWTH 5 DAYS Performed at [facility], [address] [place], [place] [address]    Report Status 01/29/2021 FINAL  Final  MRSA Next Gen by PCR, Nasal     Status: None   Collection Time: 01/25/21  4:14 AM  Result Value Ref Range Status   MRSA by PCR Next Gen NOT DETECTED NOT DETECTED Final    Comment: (NOTE) The GeneXpert MRSA Assay (FDA approved for NASAL specimens only), is one component of a comprehensive MRSA colonization surveillance program. It is not intended to diagnose MRSA infection nor to guide or monitor treatment for MRSA infections. Test performance is not FDA approved in patients less than 15 years old. Performed at [facility], [place] [address] [place], [place] [address]   CSF culture w Gram Stain     Status: None   Collection Time: 01/25/21  2:43 PM   Specimen: CSF; Cerebrospinal Fluid  Result Value Ref Range Status    Specimen Description CSF  Final   Special Requests Immunocompromised  Final   Gram Stain   Final    WBC PRESENT, PREDOMINANTLY PMN NO ORGANISMS SEEN CYTOSPIN SMEAR    Culture   Final    NO GROWTH Performed at [facility], [place] [address] [place], [place] [address]    Report Status 01/29/2021 FINAL  Final      Radiology Studies: No results found.   Marzetta Board, MD, PhD [facility]  Between 7 am - 7 pm I am available, please contact me via Amion (for emergencies) or Securechat (non urgent messages)  Between 7 pm - 7 am I am not available, please contact night coverage MD/APP via Amion

## 2021-01-31 NOTE — Progress Notes (Addendum)
[facility] NEPHROLOGY PROGRESS NOTE  Assessment/ Plan:  Outpatient HD orders:[name] TTS,EDW 41.5 kg,[place] 2K/2.5 ca,BF 350  DF 500,Time - 180 minutes Access catheter  Medications - out of hectorol and now on calcitriol - 1.25 mcg each tx. Sensipar 90 mg three times a week; Epogen 3600 units each tx, Heparin 1000 units bolus then 500 units per hour Has been on vanc 500 mg each tx (though misses often)   # Acute encephalopathy: EEG with moderate diffuse encephalopathy of nonspecific etiology.  Based on neurology evaluation appears to be consistent with PRES along with imaging showing no evidence of acute CVA.  Seen by neurology.  Mental status seems to be stable today.  # ESRD: She is on outpatient TTS schedule, status post dialysis yesterday with 2 L ultrafiltration, tolerated well.  Plan for next HD tomorrow.  Left IJ TDC for the access.  # Anemia: Without overt blood loss, continue ESA with HD.  # CKD-MBD: Resumed  calcitriol and Sensipar for PTH control.  Started calcium acetate for hyperphosphatemia and the repeat lab looks better.  Continue to monitor.  #  Right radiocephalic fistula infection: Status post ligation and debridement on 10/28 with inconsistent outpatient antibiotic therapy due to missing hemodialysis or signing off early.  Restarted back on antibiotic therapy with vancomycin.  # Hypertension: Blood pressures under decent control at this time, will continue to follow closely status post hemodialysis/UF.  Continue amlodipine.  #Hypokalemia complicated by diarrhea.  We will adjust dialysate potassium.  Subjective: Seen and examined.  No new event.  Denies nausea, vomiting, chest pain, shortness of breath.  Tolerated dialysis well yesterday. Objective Vital signs in last 24 hours: Vitals:   01/30/21 2310 01/31/21 0400 01/31/21 0500 01/31/21 0801  BP: (!) 141/74 (!) 142/78  (!) 167/72  Pulse: 88 88  80  Resp: 16 18 19 17   Temp: 98.1 F (36.7 C) 98.5  F (36.9 C)  98.2 F (36.8 C)  TempSrc: Oral Oral  Axillary  SpO2: 96% 97%  96%  Weight: 43.3 kg  43.6 kg    Weight change: 0 kg  Intake/Output Summary (Last 24 hours) at 01/31/2021 1045 Last data filed at 01/30/2021 2155 Gross per 24 hour  Intake 892.02 ml  Output 2000 ml  Net -1107.98 ml        Labs: Basic Metabolic Panel: Recent Labs  Lab 01/28/21 0331 01/29/21 0203 01/29/21 1621 01/30/21 0206 01/30/21 1749 01/31/21 0246  NA 131*  --   --   --  128* 133*  K 4.4  --   --   --  4.2 3.2*  CL 95*  --   --   --  93* 100  CO2 19*  --   --   --  24 26  GLUCOSE 81  --   --   --  92 102*  BUN 21  --   --   --  31* 5*  CREATININE 7.06*  --   --   --  5.41* 2.02*  CALCIUM 8.7*  --   --   --  7.7* 8.5*  PHOS 8.0*   < > 3.7 3.7 3.2  --    < > = values in this interval not displayed.    Liver Function Tests: Recent Labs  Lab 01/24/21 1440 01/25/21 0209 01/28/21 0331 01/30/21 1749 01/31/21 0246  AST 20  --   --   --  25  ALT 11  --   --   --  15  ALKPHOS 84  --   --   --  73  BILITOT 0.5  --   --   --  0.3  PROT 8.0  --   --   --  7.1  ALBUMIN 3.2*   < > 2.6* 2.5* 2.7*   < > = values in this interval not displayed.    No results for input(s): LIPASE, AMYLASE in the last 168 hours. No results for input(s): AMMONIA in the last 168 hours.  CBC: Recent Labs  Lab 01/24/21 1331 01/25/21 0209 01/28/21 0331 01/28/21 1650 01/29/21 0203 01/30/21 0206 01/31/21 0246  WBC 4.7   < > 4.7 4.1 5.3 5.2 4.3  NEUTROABS 3.8  --   --   --   --   --   --   HGB 10.2*   < > 9.2* 9.1* 9.7* 9.4* 9.1*  HCT 34.0*   < > 29.6* 29.5* 31.1* 30.8* 30.0*  MCV 104.3*   < > 96.7 97.7 96.3 98.1 98.7  PLT 143*   < > 134* 126* 122* 117* 114*   < > = values in this interval not displayed.    Cardiac Enzymes: No results for input(s): CKTOTAL, CKMB, CKMBINDEX, TROPONINI in the last 168 hours. CBG: Recent Labs  Lab 01/30/21 1132 01/30/21 1607 01/31/21 0036 01/31/21 0544 01/31/21 0800   GLUCAP 114* 97 100* 99 98     Iron Studies: No results for input(s): IRON, TIBC, TRANSFERRIN, FERRITIN in the last 72 hours. Studies/Results: No results found.  Medications: Infusions:  feeding supplement (OSMOLITE 1.2 CAL) 1,000 mL (01/30/21 0940)   lacosamide (VIMPAT) IV 150 mg (01/31/21 1006)   levETIRAcetam 250 mg (01/31/21 0027)   levETIRAcetam 1,000 mg (01/31/21 0753)    Scheduled Medications:  amLODipine  10 mg Oral Daily   apixaban  2.5 mg Per Tube BID   calcitRIOL  1.25 mcg Per Tube Q T,Th,Sa-HD   calcium acetate (Phos Binder)  1,334 mg Per Tube TID WC   chlorhexidine  15 mL Mouth Rinse BID   Chlorhexidine Gluconate Cloth  6 each Topical Q0600   Chlorhexidine Gluconate Cloth  6 each Topical Q0600   cinacalcet  90 mg Oral Q T,Th,Sa-HD   clopidogrel  75 mg Per Tube Q breakfast   collagenase  1 application Topical Daily   darbepoetin (ARANESP) injection - DIALYSIS  40 mcg Intravenous Q Tue-HD   dolutegravir  50 mg Per Tube q1600   feeding supplement (PROSource TF)  45 mL Per Tube Daily   hydrALAZINE  100 mg Oral Q8H   lamiVUDine  50 mg Per Tube Daily   lidocaine-prilocaine  1 application Topical Q T,Th,Sa-HD   mouth rinse  15 mL Mouth Rinse q12n4p   metoprolol tartrate  25 mg Per Tube BID   mirtazapine  15 mg Per Tube QHS   nystatin  5 mL Oral QID   pantoprazole (PROTONIX) IV  40 mg Intravenous QHS   polyethylene glycol  17 g Per Tube Daily   senna  2 tablet Per Tube QPM   thiamine injection  100 mg Intravenous Daily   vancomycin  500 mg Intravenous Q T,Th,Sa-HD   venlafaxine  75 mg Per Tube BID   Zidovudine  100 mg Per Tube Q8H    have reviewed scheduled and prn medications.  Physical Exam: General: Sitting on chair, ill looking but alert. Heart:RRR, s1s2 nl Lungs: Clear b/l, no crackle Abdomen:soft, Non-tender, non-distended Extremities:No edema Dialysis Access: Left IJ TDC in place.  Valor Quaintance Prasad Trinty Marken 01/31/2021,10:45 AM  LOS: 8 days

## 2021-01-31 NOTE — Progress Notes (Signed)
Modified Barium Swallow Progress Note  Patient Details  Name: [name] MRN: [id number] Date of Birth: [birth year]  Today's Date: 01/31/2021  Modified Barium Swallow completed.  Full report located under Chart Review in the Imaging Section.  Brief recommendations include the following:  Clinical Impression  Pt demonstrates mild persistent oropharyngeal dyspahgia with impaired ability to masticate (though this is likely baseline given that it is fully related to pts lack of dentition) as well as mild to moderate oropharyngeal residue in valleculae and pyriform sinuses. There is decreased anterior hyoid excusion and epiglottic deflection with entrapment of liquids in pharyngeal sulci. No aspiration occured. 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 Question if pt may be very close to her swallowing baseline. Presence of NG tube may be slightly impeding function. Pt able to initaite thin liquids and foods of choice. She does have difficulty masticating but again this is her baseline and pt can resume unrestricted textures.   Swallow Evaluation Recommendations       SLP Diet Recommendations: Regular solids;Thin liquid   Liquid Administration via: Cup;Straw   Medication Administration: Crushed with puree   Supervision: Patient able to self feed           Oral Care Recommendations: Oral care QID        Leighann Amadon, Katherene Ponto 01/31/2021,1:31 PM

## 2021-01-31 NOTE — Progress Notes (Signed)
Pt recently returned from swallow study.  Now pt has increased WOB rate mid 30's  sats ok around 97%  Notified Dr. Renne Crigler who is ordering chest xray.

## 2021-01-31 NOTE — Progress Notes (Signed)
Pt recently returned from swallow study.  Now pt has increased WOB rate mid 30's  sats ok around 97%  Notified Dr. Renne Crigler who is ordering chest xray.      01/31/21 1300  Assess: MEWS Score  Pulse Rate 71  ECG Heart Rate 71  Resp (!) 27  SpO2 99 %  Assess: MEWS Score  MEWS Temp 0  MEWS Systolic 0  MEWS Pulse 0  MEWS RR 2  MEWS LOC 0  MEWS Score 2  MEWS Score Color Yellow  Assess: if the MEWS score is Yellow or Red  Were vital signs taken at a resting state? Yes  Focused Assessment Change from prior assessment (see assessment flowsheet)  Early Detection of Sepsis Score *See Row Information* Low  MEWS guidelines implemented *See Row Information* Yes  Treat  Pain Scale 0-10  Pain Score 0  Escalate  MEWS: Escalate Yellow: discuss with charge nurse/RN and consider discussing with provider and RRT  Notify: Charge Nurse/RN  Name of Charge Nurse/RN Notified [place]  Date Charge Nurse/RN Notified 01/31/21  Time Charge Nurse/RN Notified 1315  Notify: Provider  Provider Name/Title Dr Cruzita Lederer  Date Provider Notified 01/31/21  Time Provider Notified 1315  Notification Type Call  Notification Reason Change in status  Provider response See new orders  Date of Provider Response 01/31/21  Time of Provider Response 1315  Document  Patient Outcome Stabilized after interventions  Progress note created (see row info) Yes

## 2021-02-01 DIAGNOSIS — Z515 Encounter for palliative care: Secondary | ICD-10-CM | POA: Diagnosis not present

## 2021-02-01 DIAGNOSIS — G9340 Encephalopathy, unspecified: Secondary | ICD-10-CM | POA: Diagnosis not present

## 2021-02-01 DIAGNOSIS — I10 Essential (primary) hypertension: Secondary | ICD-10-CM | POA: Diagnosis not present

## 2021-02-01 DIAGNOSIS — I6783 Posterior reversible encephalopathy syndrome: Secondary | ICD-10-CM | POA: Diagnosis not present

## 2021-02-01 DIAGNOSIS — N186 End stage renal disease: Secondary | ICD-10-CM | POA: Diagnosis not present

## 2021-02-01 LAB — COMPREHENSIVE METABOLIC PANEL
ALT: 26 U/L (ref 0–44)
AST: 38 U/L (ref 15–41)
Albumin: 2.7 g/dL — ABNORMAL LOW (ref 3.5–5.0)
Alkaline Phosphatase: 67 U/L (ref 38–126)
Anion gap: 9 (ref 5–15)
BUN: 15 mg/dL (ref 8–23)
CO2: 24 mmol/L (ref 22–32)
Calcium: 9.1 mg/dL (ref 8.9–10.3)
Chloride: 101 mmol/L (ref 98–111)
Creatinine, Ser: 3.88 mg/dL — ABNORMAL HIGH (ref 0.44–1.00)
GFR, Estimated: 12 mL/min — ABNORMAL LOW (ref >60)
Glucose, Bld: 94 mg/dL (ref 70–99)
Potassium: 3.8 mmol/L (ref 3.5–5.1)
Sodium: 134 mmol/L — ABNORMAL LOW (ref 135–145)
Total Bilirubin: 0.2 mg/dL — ABNORMAL LOW (ref 0.3–1.2)
Total Protein: 7 g/dL (ref 6.5–8.1)

## 2021-02-01 LAB — CBC
HCT: 29.5 % — ABNORMAL LOW (ref 36.0–46.0)
Hemoglobin: 8.9 g/dL — ABNORMAL LOW (ref 12.0–15.0)
MCH: 30.9 pg (ref 26.0–34.0)
MCHC: 30.2 g/dL (ref 30.0–36.0)
MCV: 102.4 fL — ABNORMAL HIGH (ref 80.0–100.0)
Platelets: 126 10*3/uL — ABNORMAL LOW (ref 150–400)
RBC: 2.88 MIL/uL — ABNORMAL LOW (ref 3.87–5.11)
RDW: 18.9 % — ABNORMAL HIGH (ref 11.5–15.5)
WBC: 4.1 10*3/uL (ref 4.0–10.5)
nRBC: 0 % (ref 0.0–0.2)

## 2021-02-01 LAB — GLUCOSE, CAPILLARY
Glucose-Capillary: 74 mg/dL (ref 70–99)
Glucose-Capillary: 74 mg/dL (ref 70–99)
Glucose-Capillary: 76 mg/dL (ref 70–99)
Glucose-Capillary: 81 mg/dL (ref 70–99)
Glucose-Capillary: 82 mg/dL (ref 70–99)
Glucose-Capillary: 88 mg/dL (ref 70–99)

## 2021-02-01 MED ORDER — DOLUTEGRAVIR SODIUM 50 MG PO TABS
50.0000 mg | ORAL_TABLET | Freq: Every day | ORAL | Status: DC
  Administered 2021-02-01 – 2021-02-10 (×8): 50 mg via ORAL
  Filled 2021-02-01 (×10): qty 1

## 2021-02-01 MED ORDER — METOPROLOL TARTRATE 25 MG PO TABS
25.0000 mg | ORAL_TABLET | Freq: Two times a day (BID) | ORAL | Status: DC
  Administered 2021-02-01 – 2021-02-02 (×4): 25 mg via ORAL
  Filled 2021-02-01 (×4): qty 1

## 2021-02-01 MED ORDER — ACETAMINOPHEN 325 MG PO TABS
650.0000 mg | ORAL_TABLET | Freq: Four times a day (QID) | ORAL | Status: DC | PRN
  Administered 2021-02-02 – 2021-02-10 (×4): 650 mg via ORAL
  Filled 2021-02-01 (×5): qty 2

## 2021-02-01 MED ORDER — ACETAMINOPHEN 650 MG RE SUPP: 650.0000 mg | Freq: Four times a day (QID) | RECTAL | Status: DC | PRN

## 2021-02-01 MED ORDER — SENNA 8.6 MG PO TABS
2.0000 | ORAL_TABLET | Freq: Every evening | ORAL | Status: DC
  Administered 2021-02-02 – 2021-02-06 (×3): 17.2 mg via ORAL
  Filled 2021-02-01 (×8): qty 2

## 2021-02-01 MED ORDER — POLYETHYLENE GLYCOL 3350 17 G PO PACK: 17.0000 g | PACK | Freq: Every day | ORAL | Status: DC

## 2021-02-01 MED ORDER — SODIUM CHLORIDE 0.9 % IV SOLN: 100.0000 mL | INTRAVENOUS | Status: DC | PRN

## 2021-02-01 MED ORDER — ZIDOVUDINE 50 MG/5ML PO SYRP
100.0000 mg | ORAL_SOLUTION | Freq: Three times a day (TID) | ORAL | Status: DC
  Administered 2021-02-01 – 2021-02-03 (×6): 100 mg via ORAL
  Filled 2021-02-01 (×9): qty 10

## 2021-02-01 MED ORDER — APIXABAN 2.5 MG PO TABS
2.5000 mg | ORAL_TABLET | Freq: Two times a day (BID) | ORAL | Status: DC
  Administered 2021-02-01 – 2021-02-02 (×4): 2.5 mg via ORAL
  Filled 2021-02-01 (×4): qty 1

## 2021-02-01 MED ORDER — LAMIVUDINE 10 MG/ML PO SOLN
50.0000 mg | Freq: Every day | ORAL | Status: DC
  Administered 2021-02-01 – 2021-02-08 (×5): 50 mg via ORAL
  Filled 2021-02-01 (×10): qty 5

## 2021-02-01 MED ORDER — ALTEPLASE 2 MG IJ SOLR: 2.0000 mg | Freq: Once | INTRAMUSCULAR | Status: DC | PRN

## 2021-02-01 MED ORDER — LOPERAMIDE HCL 2 MG PO CAPS
2.0000 mg | ORAL_CAPSULE | ORAL | Status: DC | PRN
  Administered 2021-02-01 – 2021-02-06 (×2): 2 mg via ORAL
  Filled 2021-02-01 (×2): qty 1

## 2021-02-01 MED ORDER — ONDANSETRON HCL 4 MG/2ML IJ SOLN
4.0000 mg | Freq: Four times a day (QID) | INTRAMUSCULAR | Status: DC | PRN
  Administered 2021-02-03 – 2021-02-09 (×3): 4 mg via INTRAVENOUS
  Filled 2021-02-01 (×3): qty 2

## 2021-02-01 MED ORDER — LIDOCAINE-PRILOCAINE 2.5-2.5 % EX CREA: 1.0000 "application " | TOPICAL_CREAM | CUTANEOUS | Status: DC | PRN

## 2021-02-01 MED ORDER — HEPARIN SODIUM (PORCINE) 1000 UNIT/ML DIALYSIS: 20.0000 [IU]/kg | INTRAMUSCULAR | Status: DC | PRN

## 2021-02-01 MED ORDER — CALCITRIOL 0.25 MCG PO CAPS
1.2500 ug | ORAL_CAPSULE | ORAL | Status: DC
  Administered 2021-02-06 – 2021-02-08 (×2): 1.25 ug via ORAL
  Filled 2021-02-01 (×3): qty 5

## 2021-02-01 MED ORDER — CLOPIDOGREL BISULFATE 75 MG PO TABS
75.0000 mg | ORAL_TABLET | Freq: Every day | ORAL | Status: DC
  Administered 2021-02-01 – 2021-02-03 (×3): 75 mg via ORAL
  Filled 2021-02-01 (×3): qty 1

## 2021-02-01 MED ORDER — ONDANSETRON HCL 4 MG PO TABS: 4.0000 mg | ORAL_TABLET | Freq: Four times a day (QID) | ORAL | Status: DC | PRN

## 2021-02-01 MED ORDER — HEPARIN SODIUM (PORCINE) 1000 UNIT/ML DIALYSIS
1000.0000 [IU] | INTRAMUSCULAR | Status: DC | PRN
  Administered 2021-02-01: 1000 [IU] via INTRAVENOUS_CENTRAL
  Filled 2021-02-01: qty 1

## 2021-02-01 MED ORDER — CALCIUM ACETATE (PHOS BINDER) 667 MG/5ML PO SOLN
1334.0000 mg | Freq: Three times a day (TID) | ORAL | Status: DC
  Administered 2021-02-02 – 2021-02-06 (×7): 1334 mg via ORAL
  Filled 2021-02-01 (×28): qty 10

## 2021-02-01 MED ORDER — PENTAFLUOROPROP-TETRAFLUOROETH EX AERO: 1.0000 "application " | INHALATION_SPRAY | CUTANEOUS | Status: DC | PRN

## 2021-02-01 MED ORDER — MIRTAZAPINE 15 MG PO TABS
15.0000 mg | ORAL_TABLET | Freq: Every day | ORAL | Status: DC
  Administered 2021-02-01 – 2021-02-09 (×8): 15 mg via ORAL
  Filled 2021-02-01 (×10): qty 1

## 2021-02-01 MED ORDER — LIDOCAINE HCL (PF) 1 % IJ SOLN
5.0000 mL | INTRAMUSCULAR | Status: DC | PRN
  Filled 2021-02-01: qty 5

## 2021-02-01 MED ORDER — VENLAFAXINE HCL 75 MG PO TABS
75.0000 mg | ORAL_TABLET | Freq: Two times a day (BID) | ORAL | Status: DC
  Administered 2021-02-01 – 2021-02-10 (×16): 75 mg via ORAL
  Filled 2021-02-01 (×20): qty 1

## 2021-02-01 NOTE — TOC Progression Note (Signed)
Transition of Care [facility]) - Progression Note    Patient Details  Name: [name] MRN: [id number] Date of Birth: [birth year]  Transition of Care [facility]) CM/SW [place], [place] Phone Number: 02/01/2021, 1:52 PM  Clinical Narrative:    CSW attempted to contact [place] station with no answer. CSW left voicemail for admissions to see if they can accept patient back over the weekend.      Barriers to Discharge: Continued Medical Work up  Expected Discharge Plan and Services       Post Acute Care Choice: [place] Living arrangements for the past 2 months: [place]                                       Social Determinants of Health (SDOH) Interventions    Readmission Risk Interventions Readmission Risk Prevention Plan 10/22/2019  Transportation Screening Complete  PCP or Specialist Appt within 3-5 Days Not Complete  Not Complete comments SNF MD to follow  [employer] or [place] Not Complete  HRI or Home Care Consult comments Pt resides in a SNF  Social Work Consult for [place] Planning/Counseling Complete  Palliative Care Screening Not Applicable  Medication Review Press photographer) Complete  Some recent data might be hidden

## 2021-02-01 NOTE — Progress Notes (Addendum)
PROGRESS NOTE  [name] VQM:[id number] DOB: 06-Jun-1950 DOA: 01/23/2021 PCP: Hilbert Corrigan, MD   LOS: 9 days   Brief Narrative / Interim history: 70 year old female who has a history of a flutter on Eliquis, pulmonary hypertension, HIV, GERD, ESRD on TTS HD, fistula infection on vent treatment with HD as an outpatient but not consistently administered due to early termination of her HD, was brought to [facility] on 11/24 due to a syncopal episode while undergoing HD.  She also had intermittent confusion at that time.  After admission, on 11/25 became hypertensive, had a seizure and neurology was consulted.  An MRI of the brain was concerning for press.  She was started on Cardene drip and she was transferred to [facility] for ICU management and continuous EEG.  Her mental status continues to improve.  On 11/28 failed bedside speech eval and core track was placed.  On 11/29 her continuous EEG has been discontinued, and with continued improvement she was transferred to the hospitalist service on 12/1.  Nephrology also following for her dialysis needs.  Subjective / 24h Interval events: No complaints today.  No shortness of breath, no chest pain.  Core track removed yesterday as she passed swallow eval.    Assessment & Plan: Principal Problem:   PRES (posterior reversible encephalopathy syndrome) Active Problems:   Human immunodeficiency virus (HIV) disease (HCC)   Gastroesophageal reflux disease   Atrial fibrillation (HCC)   ESRD on dialysis (HCC)   Hyperkalemia   Macrocytic anemia   History of stroke   Seizure ([place])   Hypertensive emergency   MRSA infection   Acute delirium   Thrombocytopenia ([place])   Hyponatremia   Principal Problem Acute encephalopathy with press, hypertensive emergency -patient had a seizure episode in the setting of hypertension. MRI showed cortical and subcortical edema in the occipital parietal lobes bilaterally.  She was  subsequently admitted to the ICU, improved and transferred to the hospitalist service on 12/1. Current antihypertensive regimen includes amlodipine 10 mg daily, metoprolol 25 mg twice daily.  Encephalopathy is starting to resolve.  Blood pressure on the high side on 12/2 and hydralazine was increased from 50 to 100 Q8.  Blood pressure better today, keep on the same regimen  Active Problems Seizure in the setting of press -neurology consulted and following.  EEG initially showed nonconvulsive status epilepticus.  She underwent continuous EEG monitoring.  Currently she is on Vimpat 150 mg every 12 as well as Keppra 1 g daily and an additional 250 mg with each HD.  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  ID was consulted and recommending vancomycin with dialysis for total of 6 weeks with a last day 02/12/2021.  Continue vancomycin for now.  There are some reports that she might have missed dialysis and antibiotics however patient denies, and overall this is not clear.  Discussed with nephrology, they are not sure either. Wound looks well, as below.     Paroxysmal atrial fibrillation with rapid ventricular response -Cardiac monitoring, improved, rate controlled currently, on metoprolol.  She was initially maintained on heparin, now transition to Eliquis  ESRD, on HD-nephrology following   HIV -On antiretrovirals   Anemia of chronic disease, thrombocytopenia-in the setting of renal disease, monitor.  No bleeding.  Severe protein calorie malnutrition -Has core track in  place, continue supplementation -Appreciate speech therapy, she will get modified barium swallow today, follow results  GERD -PPI   Hyponatremia-volume managed with dialysis  Scheduled Meds:  amLODipine  10 mg Oral Daily   apixaban  2.5 mg Per Tube BID    calcitRIOL  1.25 mcg Per Tube Q T,Th,Sa-HD   calcium acetate (Phos Binder)  1,334 mg Per Tube TID WC   chlorhexidine  15 mL Mouth Rinse BID   Chlorhexidine Gluconate Cloth  6 each Topical Q0600   cinacalcet  90 mg Oral Q T,Th,Sa-HD   clopidogrel  75 mg Per Tube Q breakfast   collagenase  1 application Topical Daily   darbepoetin (ARANESP) injection - DIALYSIS  40 mcg Intravenous Q Tue-HD   dolutegravir  50 mg Per Tube q1600   feeding supplement (PROSource TF)  45 mL Per Tube Daily   hydrALAZINE  100 mg Oral Q8H   lamiVUDine  50 mg Per Tube Daily   lidocaine-prilocaine  1 application Topical Q T,Th,Sa-HD   mouth rinse  15 mL Mouth Rinse q12n4p   metoprolol tartrate  25 mg Per Tube BID   mirtazapine  15 mg Per Tube QHS   nystatin  5 mL Oral QID   pantoprazole (PROTONIX) IV  40 mg Intravenous QHS   polyethylene glycol  17 g Per Tube Daily   senna  2 tablet Per Tube QPM   thiamine injection  100 mg Intravenous Daily   vancomycin  500 mg Intravenous Q T,Th,Sa-HD   venlafaxine  75 mg Per Tube BID   Zidovudine  100 mg Per Tube Q8H   Continuous Infusions:  feeding supplement (OSMOLITE 1.2 CAL) Stopped (01/31/21 1853)   lacosamide (VIMPAT) IV 150 mg (01/31/21 2205)   levETIRAcetam Stopped (01/31/21 0050)   levETIRAcetam 1,000 mg (02/01/21 0548)   PRN Meds:.acetaminophen (TYLENOL) oral liquid 160 mg/5 mL **OR** acetaminophen, lanolin/mineral oil, lidocaine, LORazepam, ondansetron **OR** ondansetron (ZOFRAN) IV, ondansetron  Diet Orders (From admission, onward)     Start     Ordered   01/31/21 1300  Diet regular Room service appropriate? Yes; Fluid consistency: Thin  Diet effective now       Question Answer Comment  Room service appropriate? Yes   Fluid consistency: Thin      01/31/21 1259            DVT prophylaxis: apixaban (ELIQUIS) tablet 2.5 mg Start: 01/30/21 1045 apixaban (ELIQUIS) tablet 2.5 mg     Code Status: DNR  Family Communication: no family at bedside.  Could not reach daughter over the phone x 2  Status is: Inpatient  Remains inpatient appropriate because: Dialysis today, can probably go to SNF when bed is available  Level of care: Progressive  Consultants:  PCCM Nephrology  Procedures:  As above  Microbiology  none  Antimicrobials: Vancomycin    Objective: Vitals:   02/01/21 1130 02/01/21 1200 02/01/21 1223 02/01/21 1230  BP: 133/89 (!) 124/92 135/82 (!) 146/90  Pulse:   76 75  Resp: 17 18  16   Temp:   97.8 F (36.6 C)   TempSrc:   Oral   SpO2:   97%   Weight:        Intake/Output Summary (Last 24 hours) at 02/01/2021 1240 Last data filed at 02/01/2021 1223 Gross per 24 hour  Intake 480 ml  Output 3000 ml  Net -2520 ml    Filed Weights   01/31/21 0500 02/01/21 0500 02/01/21 0848  Weight: 43.6 kg 44 kg 41.8 kg  Examination: Constitutional: NAD, in bed Eyes: No scleral icterus ENMT:mmm Neck: normal, supple Respiratory: Clear bilaterally, no wheezing Cardiovascular: Regular rate and rhythm, no edema Abdomen: Soft, NT, ND, positive bowel sounds Musculoskeletal: no clubbing / cyanosis.  Skin: No rashes Neurologic: Nonfocal  Data Reviewed: I have independently reviewed following labs and imaging studies   CBC: Recent Labs  Lab 01/28/21 1650 01/29/21 0203 01/30/21 0206 01/31/21 0246 02/01/21 0608  WBC 4.1 5.3 5.2 4.3 4.1  HGB 9.1* 9.7* 9.4* 9.1* 8.9*  HCT 29.5* 31.1* 30.8* 30.0* 29.5*  MCV 97.7 96.3 98.1 98.7 102.4*  PLT 126* 122* 117* 114* 126*    Basic Metabolic Panel: Recent Labs  Lab 01/27/21 0443 01/28/21 0331 01/29/21 0203 01/29/21 1621 01/30/21 0206 01/30/21 1749 01/31/21 0246 01/31/21 2030 02/01/21 0608  NA 132* 131*  --   --   --  128* 133*  --  134*  K 4.2 4.4  --   --   --  4.2 3.2* 3.4* 3.8  CL 97* 95*  --   --   --  93* 100  --  101  CO2 21* 19*  --   --   --  24 26  --  24  GLUCOSE 77 81  --   --   --  92 102*  --  94  BUN 16 21  --   --   --  31* 5*  --  15   CREATININE 5.49* 7.06*  --   --   --  5.41* 2.02*  --  3.88*  CALCIUM 8.7* 8.7*  --   --   --  7.7* 8.5*  --  9.1  MG  --  1.9 1.7 1.8 1.8  --   --  1.9  --   PHOS 6.3* 8.0* 3.9 3.7 3.7 3.2  --   --   --     Liver Function Tests: Recent Labs  Lab 01/27/21 0443 01/28/21 0331 01/30/21 1749 01/31/21 0246 02/01/21 0608  AST  --   --   --  25 38  ALT  --   --   --  15 26  ALKPHOS  --   --   --  73 67  BILITOT  --   --   --  0.3 0.2*  PROT  --   --   --  7.1 7.0  ALBUMIN 2.7* 2.6* 2.5* 2.7* 2.7*    Coagulation Profile: No results for input(s): INR, PROTIME in the last 168 hours.  HbA1C: No results for input(s): HGBA1C in the last 72 hours. CBG: Recent Labs  Lab 01/31/21 1519 01/31/21 2018 02/01/21 0018 02/01/21 0402 02/01/21 0753  GLUCAP 87 80 88 81 82     Recent Results (from the past 240 hour(s))  Resp Panel by RT-PCR (Flu A&B, Covid) Nasopharyngeal Swab     Status: None   Collection Time: 01/23/21  3:04 PM   Specimen: Nasopharyngeal Swab; Nasopharyngeal(NP) swabs in vial transport medium  Result Value Ref Range Status   SARS Coronavirus 2 by RT PCR NEGATIVE NEGATIVE Final    Comment: (NOTE) SARS-CoV-2 target nucleic acids are NOT DETECTED.  The SARS-CoV-2 RNA is generally detectable in upper respiratory specimens during the acute phase of infection. The lowest concentration of SARS-CoV-2 viral copies this assay can detect is 138 copies/mL. A negative result does not preclude SARS-Cov-2 infection and should not be used as the sole basis for treatment or other patient management decisions. A negative result may occur with  improper specimen collection/handling, submission of specimen other than nasopharyngeal swab, presence of viral mutation(s) within the areas targeted by this assay, and inadequate number of viral copies(<138 copies/mL). A negative result must be combined with clinical observations, patient history, and epidemiological information. The  expected result is Negative.  Fact Sheet for Patients:  EntrepreneurPulse.com.au  Fact Sheet for Healthcare Providers:  IncredibleEmployment.be  This test is no t yet approved or cleared by the [place] FDA and  has been authorized for detection and/or diagnosis of SARS-CoV-2 by FDA under an Emergency Use Authorization (EUA). This EUA will remain  in effect (meaning this test can be used) for the duration of the COVID-19 declaration under Section 564(b)(1) of the Act, 21 U.S.C.section 360bbb-3(b)(1), unless the authorization is terminated  or revoked sooner.       Influenza A by PCR NEGATIVE NEGATIVE Final   Influenza B by PCR NEGATIVE NEGATIVE Final    Comment: (NOTE) The Xpert Xpress SARS-CoV-2/FLU/RSV plus assay is intended as an aid in the diagnosis of influenza from Nasopharyngeal swab specimens and should not be used as a sole basis for treatment. Nasal washings and aspirates are unacceptable for Xpert Xpress SARS-CoV-2/FLU/RSV testing.  Fact Sheet for Patients: EntrepreneurPulse.com.au  Fact Sheet for Healthcare Providers: IncredibleEmployment.be  This test is not yet approved or cleared by the [place] FDA and has been authorized for detection and/or diagnosis of SARS-CoV-2 by FDA under an Emergency Use Authorization (EUA). This EUA will remain in effect (meaning this test can be used) for the duration of the COVID-19 declaration under Section 564(b)(1) of the Act, 21 U.S.C. section 360bbb-3(b)(1), unless the authorization is terminated or revoked.  Performed at [facility], [address] [place], [place] [address]   Culture, blood (routine x 2)     Status: None   Collection Time: 01/24/21  1:31 PM   Specimen: BLOOD RIGHT HAND  Result Value Ref Range Status   Specimen Description BLOOD RIGHT HAND  Final   Special Requests   Final    Immunocompromised BOTTLES DRAWN AEROBIC AND  ANAEROBIC Blood Culture results may not be optimal due to an inadequate volume of blood received in culture bottles   Culture   Final    NO GROWTH 5 DAYS Performed at [facility], [address], [place], [place] [address]    Report Status 01/29/2021 FINAL  Final  Culture, blood (routine x 2)     Status: None   Collection Time: 01/24/21  1:31 PM   Specimen: BLOOD LEFT HAND  Result Value Ref Range Status   Specimen Description BLOOD LEFT HAND  Final   Special Requests   Final    Immunocompromised BOTTLES DRAWN AEROBIC AND ANAEROBIC Blood Culture adequate volume   Culture   Final    NO GROWTH 5 DAYS Performed at [facility], [address], [place], [place] [address]    Report Status 01/29/2021 FINAL  Final  MRSA Next Gen by PCR, Nasal     Status: None   Collection Time: 01/25/21  4:14 AM  Result Value Ref Range Status   MRSA by PCR Next Gen NOT DETECTED NOT DETECTED Final    Comment: (NOTE) The GeneXpert MRSA Assay (FDA approved for NASAL specimens only), is one component of a comprehensive MRSA colonization surveillance program. It is not intended to diagnose MRSA infection nor to guide or monitor treatment for MRSA infections. Test performance is not FDA approved in patients less than 68 years old. Performed at [facility], [address], [place], [place] [address]   CSF culture w Gram Stain     Status: None   Collection Time: 01/25/21  2:43 PM   Specimen: CSF; Cerebrospinal Fluid  Result Value Ref Range Status   Specimen Description CSF  Final   Special Requests Immunocompromised  Final   Gram Stain   Final    WBC PRESENT, PREDOMINANTLY PMN NO ORGANISMS SEEN CYTOSPIN SMEAR    Culture   Final    NO GROWTH Performed at [facility], [place] [address] [place], [place] [address]    Report Status 01/29/2021 FINAL  Final      Radiology Studies: DG CHEST PORT 1 VIEW  Result Date: 01/31/2021 CLINICAL DATA:  Dyspnea EXAM: PORTABLE CHEST 1 VIEW COMPARISON:   01/23/2021 FINDINGS: Cardiac shadow is stable. Aortic calcifications are again seen. Left jugular dialysis catheter is noted in satisfactory position. Feeding catheter extends into the stomach. Changes of prior vascular stenting are noted on the right stable from the prior exam. Central vascular prominence is noted. Some mild interstitial changes are noted increased from the prior exam. No focal confluent infiltrate or effusion is seen. No bony abnormality is noted. IMPRESSION: Changes of mild CHF. No other focal abnormality is seen. Electronically Signed   By: Inez Catalina M.D.   On: 01/31/2021 19:02     Marzetta Board, MD, PhD [facility]  Between 7 am - 7 pm I am available, please contact me via Amion (for emergencies) or Securechat (non urgent messages)  Between 7 pm - 7 am I am not available, please contact night coverage MD/APP via Amion

## 2021-02-01 NOTE — Progress Notes (Signed)
Palliative Medicine Inpatient Follow Up Note  HPI: [name] is a 70 year old female with PMH significant for ESRD on HD TTS, HIV, atrial flutter on Eliquis, anemia of chronic disease, chronic pain,GERD,  and recent infection involving right AV fistula status post washout and currently on IV vancomycin with wound vac. She was transferred to [facility] from [facility] ER where she was admitted after syncope while on hemodialysis with a recent development of intermittent confusion. She had a  seizure episode and was MRI raised possibility of PRES. Other concerns have been hypertension, elevated TSH and elevated T4 with no known hyperthyroidism history.   Received hemodialysis last night. She went into afib with RVR, started on amiodarone with heart rate control. LP ruled out acute meningitis/encephalitis. 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 Mental status is improving.   Per prior palliative care notes [name] had been fairly adamant in the past about not wishing for supplemental nutrition via feeding tube.  She had been educated on ways to increase her nutritional state though unfortunately she had been readmitted and remains to have insufficient nutritional support.  Today's Discussion (02/01/2021): Chart reviewed.   I met with [name] at bedside this morning.  We reviewed that the nasogastric tube is now out of her nose and she was able to pass the swallow evaluation.  I asked [name] if she would ever want a long-term gastrostomy tube which she was able to clearly tell me she would not wish or desire for.  We discussed that per my chart review this is consistent with what she said in the past though I did want to again check to verify the still maintain as her wishes.  [name] would like to continue with oral intake.  I was able to call patient's daughter [name] this afternoon.  She expresses that she is still having car troubles so will be unable to make  it to the hospital today.  I reviewed the above conversation with her and she shares in the past she has spoken to her mother about long-term artificial nutrition and her mother has not wanted this.  We reviewed that in the long-term if she has insufficient oral intake for a prolonged period of time and the declining health state we will be having a conversation in regards to hospice care.  [name] stated awareness of this and shares she will try her best to be in the hospital tomorrow.  Objective Assessment: Vital Signs Vitals:   02/01/21 1230 02/01/21 1310  BP: (!) 146/90 132/82  Pulse: 75 74  Resp: 16 16  Temp:  98 F (36.7 C)  SpO2:  99%    Intake/Output Summary (Last 24 hours) at 02/01/2021 1436 Last data filed at 02/01/2021 1223 Gross per 24 hour  Intake 480 ml  Output 3000 ml  Net -2520 ml    Last Weight  Most recent update: 02/01/2021 12:40 PM    Weight  38.5 kg (84 lb 14 oz)            SUMMARY OF RECOMMENDATIONS   DNAR/DNI, No gastrostomy tube per patient  Ongoing palliative care support  Code Status/Advance Care Planning: DNAR/DNI, No gastrostomy tube --> Patient is clear about this and daughter is aware. MOST in [place] reflects this. Limited additional interventions: use medical treatment, IV fluids and cardiac monitoring as indicated May use less invasive airway support such as BiPAP or CPAP. Also provide comfort measures Transfer to hospital if indicated  Symptom  Management:  Pain- acetaminophen prn Nausea- ondansetron prn Oral Thrush- Nystatin QID   Palliative Prophylaxis:  GI prophylaxis- pantoprazole Constipation- senna prn  Time Spent: 35 minutes Greater than 50% of the time was spent in counseling and coordination of care ______________________________________________________________________________________ [name], [place] Team Team Cell Phone: [phone] Please utilize secure chat with additional questions, if  there is no response within 30 minutes please call the above phone number  Palliative Medicine Team providers are available by phone from 7am to 7pm daily and can be reached through the team cell phone.  Should this patient require assistance outside of these hours, please call the patient's attending physician.

## 2021-02-01 NOTE — Procedures (Signed)
Patient was seen on dialysis and the procedure was supervised.  BFR 400  Via TDC BP is  155/95. SOB overnight, will challenge UF to 3 L as tolerated d/w dialysis nurse.   Patient appears to be tolerating treatment well. She is confused and somnolent.  Shine Mikes Tanna Furry 02/01/2021

## 2021-02-02 DIAGNOSIS — I10 Essential (primary) hypertension: Secondary | ICD-10-CM | POA: Diagnosis not present

## 2021-02-02 DIAGNOSIS — G9340 Encephalopathy, unspecified: Secondary | ICD-10-CM | POA: Diagnosis not present

## 2021-02-02 DIAGNOSIS — Z515 Encounter for palliative care: Secondary | ICD-10-CM | POA: Diagnosis not present

## 2021-02-02 DIAGNOSIS — I4811 Longstanding persistent atrial fibrillation: Secondary | ICD-10-CM | POA: Diagnosis not present

## 2021-02-02 DIAGNOSIS — N186 End stage renal disease: Secondary | ICD-10-CM | POA: Diagnosis not present

## 2021-02-02 DIAGNOSIS — Z7189 Other specified counseling: Secondary | ICD-10-CM | POA: Diagnosis not present

## 2021-02-02 LAB — GLUCOSE, CAPILLARY
Glucose-Capillary: 108 mg/dL — ABNORMAL HIGH (ref 70–99)
Glucose-Capillary: 76 mg/dL (ref 70–99)
Glucose-Capillary: 76 mg/dL (ref 70–99)
Glucose-Capillary: 80 mg/dL (ref 70–99)
Glucose-Capillary: 87 mg/dL (ref 70–99)
Glucose-Capillary: 97 mg/dL (ref 70–99)

## 2021-02-02 MED ORDER — PANTOPRAZOLE SODIUM 40 MG PO TBEC
40.0000 mg | DELAYED_RELEASE_TABLET | Freq: Every day | ORAL | Status: DC
  Administered 2021-02-02 – 2021-02-09 (×7): 40 mg via ORAL
  Filled 2021-02-02 (×8): qty 1

## 2021-02-02 MED ORDER — FLUCONAZOLE 100 MG PO TABS
100.0000 mg | ORAL_TABLET | Freq: Every day | ORAL | Status: AC
  Administered 2021-02-02 – 2021-02-09 (×8): 100 mg via ORAL
  Filled 2021-02-02 (×8): qty 1

## 2021-02-02 MED ORDER — LEVETIRACETAM 500 MG PO TABS
1000.0000 mg | ORAL_TABLET | Freq: Every day | ORAL | Status: DC
  Administered 2021-02-03 – 2021-02-10 (×8): 1000 mg via ORAL
  Filled 2021-02-02 (×8): qty 2

## 2021-02-02 MED ORDER — LEVETIRACETAM 250 MG PO TABS
250.0000 mg | ORAL_TABLET | ORAL | Status: DC
  Administered 2021-02-08: 250 mg via ORAL
  Filled 2021-02-02 (×3): qty 1

## 2021-02-02 MED ORDER — LACOSAMIDE 50 MG PO TABS
150.0000 mg | ORAL_TABLET | Freq: Two times a day (BID) | ORAL | Status: DC
  Administered 2021-02-02 – 2021-02-10 (×17): 150 mg via ORAL
  Filled 2021-02-02 (×17): qty 3

## 2021-02-02 MED ORDER — THIAMINE HCL 100 MG PO TABS
100.0000 mg | ORAL_TABLET | Freq: Every day | ORAL | Status: DC
  Administered 2021-02-03 – 2021-02-10 (×6): 100 mg via ORAL
  Filled 2021-02-02 (×7): qty 1

## 2021-02-02 NOTE — Progress Notes (Signed)
[facility] NEPHROLOGY PROGRESS NOTE  Assessment/ Plan:  Outpatient HD orders:[name] TTS,EDW 41.5 kg,Bath 2K/2.5 ca,BF 350  DF 500,Time - 180 minutes Access catheter  Medications - out of hectorol and now on calcitriol - 1.25 mcg each tx. Sensipar 90 mg three times a week; Epogen 3600 units each tx, Heparin 1000 units bolus then 500 units per hour Has been on vanc 500 mg each tx (though misses often)   # Acute encephalopathy: EEG with moderate diffuse encephalopathy of nonspecific etiology.  Based on neurology evaluation appears to be consistent with PRES along with imaging showing no evidence of acute CVA.  Seen by neurology.  Mental status seems seems to be stable.  # ESRD: She is on outpatient TTS schedule, status post dialysis yesterday with 3 L ultrafiltration, tolerated well.  Plan for next HD on 12/6.  Left IJ TDC for the access.  # Anemia: Without overt blood loss, continue ESA with HD.  # CKD-MBD: Resumed  calcitriol and Sensipar for PTH control.  Started calcium acetate for hyperphosphatemia and the repeat lab looks better.  Continue to monitor.  #  Right radiocephalic fistula infection: Status post ligation and debridement on 10/28 with inconsistent outpatient antibiotic therapy due to missing hemodialysis or signing off early.  Restarted back on antibiotic therapy with vancomycin.  # Hypertension: Blood pressures elevated.  She is currently on amlodipine, hydralazine and metoprolol dose was increased yesterday.  Volume status acceptable.  Continue monitor blood pressure, UF as tolerated with HD.    #Hypokalemia complicated by diarrhea.  Increased potassium bath during dialysis, potassium 3.8 today.  Subjective: Seen and examined.  No new event.  Tolerated dialysis yesterday.  No nausea, vomiting, chest pain, shortness of breath. Objective Vital signs in last 24 hours: Vitals:   02/01/21 2143 02/02/21 0334 02/02/21 0539 02/02/21 0808  BP: (!) 164/97 (!)  157/86 (!) 158/86 (!) 163/94  Pulse:  67 68 67  Resp: (!) 21 16 16 16   Temp:  98.3 F (36.8 C) 98.5 F (36.9 C) 98 F (36.7 C)  TempSrc:  Oral Oral Oral  SpO2:  97% 100% 98%  Weight:       Weight change: -2.2 kg  Intake/Output Summary (Last 24 hours) at 02/02/2021 1016 Last data filed at 02/01/2021 1223 Gross per 24 hour  Intake --  Output 3000 ml  Net -3000 ml        Labs: Basic Metabolic Panel: Recent Labs  Lab 01/29/21 1621 01/30/21 0206 01/30/21 1749 01/31/21 0246 01/31/21 2030 02/01/21 0608  NA  --   --  128* 133*  --  134*  K  --   --  4.2 3.2* 3.4* 3.8  CL  --   --  93* 100  --  101  CO2  --   --  24 26  --  24  GLUCOSE  --   --  92 102*  --  94  BUN  --   --  31* 5*  --  15  CREATININE  --   --  5.41* 2.02*  --  3.88*  CALCIUM  --   --  7.7* 8.5*  --  9.1  PHOS 3.7 3.7 3.2  --   --   --     Liver Function Tests: Recent Labs  Lab 01/30/21 1749 01/31/21 0246 02/01/21 0608  AST  --  25 38  ALT  --  15 26  ALKPHOS  --  73 67  BILITOT  --  0.3 0.2*  PROT  --  7.1 7.0  ALBUMIN 2.5* 2.7* 2.7*    No results for input(s): LIPASE, AMYLASE in the last 168 hours. No results for input(s): AMMONIA in the last 168 hours.  CBC: Recent Labs  Lab 01/28/21 1650 01/29/21 0203 01/30/21 0206 01/31/21 0246 02/01/21 0608  WBC 4.1 5.3 5.2 4.3 4.1  HGB 9.1* 9.7* 9.4* 9.1* 8.9*  HCT 29.5* 31.1* 30.8* 30.0* 29.5*  MCV 97.7 96.3 98.1 98.7 102.4*  PLT 126* 122* 117* 114* 126*    Cardiac Enzymes: No results for input(s): CKTOTAL, CKMB, CKMBINDEX, TROPONINI in the last 168 hours. CBG: Recent Labs  Lab 02/01/21 2042 02/01/21 2140 02/02/21 0033 02/02/21 0428 02/02/21 0806  GLUCAP 74 74 76 80 76     Iron Studies: No results for input(s): IRON, TIBC, TRANSFERRIN, FERRITIN in the last 72 hours. Studies/Results: DG CHEST PORT 1 VIEW  Result Date: 01/31/2021 CLINICAL DATA:  Dyspnea EXAM: PORTABLE CHEST 1 VIEW COMPARISON:  01/23/2021 FINDINGS: Cardiac  shadow is stable. Aortic calcifications are again seen. Left jugular dialysis catheter is noted in satisfactory position. Feeding catheter extends into the stomach. Changes of prior vascular stenting are noted on the right stable from the prior exam. Central vascular prominence is noted. Some mild interstitial changes are noted increased from the prior exam. No focal confluent infiltrate or effusion is seen. No bony abnormality is noted. IMPRESSION: Changes of mild CHF. No other focal abnormality is seen. Electronically Signed   By: Inez Catalina M.D.   On: 01/31/2021 19:02    Medications: Infusions:    Scheduled Medications:  amLODipine  10 mg Oral Daily   apixaban  2.5 mg Oral BID   [START ON 02/04/2021] calcitRIOL  1.25 mcg Oral Q T,Th,Sa-HD   calcium acetate (Phos Binder)  1,334 mg Oral TID WC   chlorhexidine  15 mL Mouth Rinse BID   Chlorhexidine Gluconate Cloth  6 each Topical Q0600   cinacalcet  90 mg Oral Q T,Th,Sa-HD   clopidogrel  75 mg Oral Q breakfast   collagenase  1 application Topical Daily   darbepoetin (ARANESP) injection - DIALYSIS  40 mcg Intravenous Q Tue-HD   dolutegravir  50 mg Oral q1600   fluconazole  100 mg Oral Daily   hydrALAZINE  100 mg Oral Q8H   lacosamide  150 mg Oral BID   lamiVUDine  50 mg Oral Daily   [START ON 02/03/2021] levETIRAcetam  1,000 mg Oral Daily   [START ON 02/04/2021] levETIRAcetam  250 mg Oral Q T,Th,Sat-1800   lidocaine-prilocaine  1 application Topical Q T,Th,Sa-HD   mouth rinse  15 mL Mouth Rinse q12n4p   metoprolol tartrate  25 mg Oral BID   mirtazapine  15 mg Oral QHS   nystatin  5 mL Oral QID   pantoprazole  40 mg Oral QHS   polyethylene glycol  17 g Oral Daily   senna  2 tablet Oral QPM   thiamine  100 mg Oral Daily   vancomycin  500 mg Intravenous Q T,Th,Sa-HD   venlafaxine  75 mg Oral BID   Zidovudine  100 mg Oral Q8H    have reviewed scheduled and prn medications.  Physical Exam: General: Lying on bed comfortable, not in  distress. Heart:RRR, s1s2 nl Lungs: Clear b/l, no crackle Abdomen:soft, Non-tender, non-distended Extremities:No peripheral edema Dialysis Access: Left IJ TDC in place.  Terina Mcelhinny Prasad Kymere Fullington 02/02/2021,10:16 AM  LOS: 10 days

## 2021-02-02 NOTE — Progress Notes (Signed)
PROGRESS NOTE  JAMA MCMILLER QJF:[id number] DOB: 18-Mar-1950 DOA: 01/23/2021 PCP: Hilbert Corrigan, MD   LOS: 10 days   Brief Narrative / Interim history: 70 year old female who has a history of a flutter on Eliquis, pulmonary hypertension, HIV, GERD, ESRD on TTS HD, fistula infection on vent treatment with HD as an outpatient but not consistently administered due to early termination of her HD, was brought to [facility] on 11/24 due to a syncopal episode while undergoing HD.  She also had intermittent confusion at that time.  After admission, on 11/25 became hypertensive, had a seizure and neurology was consulted.  An MRI of the brain was concerning for press.  She was started on Cardene drip and she was transferred to [facility] for ICU management and continuous EEG.  Her mental status continues to improve.  On 11/28 failed bedside speech eval and core track was placed.  On 11/29 her continuous EEG has been discontinued, and with continued improvement she was transferred to the hospitalist service on 12/1.  Nephrology also following for her dialysis needs.  Subjective: Patient mentions that she is having difficulty sleeping.  Denies any pain.  No headaches.  No shortness of breath nausea or vomiting.     Assessment & Plan:  Acute encephalopathy with press, hypertensive emergency Patient had a seizure episode in the setting of hypertension. MRI showed cortical and subcortical edema in the occipital parietal lobes bilaterally.  She was subsequently admitted to the ICU, improved and transferred to the hospitalist service on 12/1.  Current antihypertensive regimen includes amlodipine 10 mg daily, metoprolol 25 mg twice daily.   Encephalopathy is improving.  Follows commands.  Answering questions appropriately though noted to be mildly distracted.    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  EEG initially showed nonconvulsive status epilepticus.  She  underwent continuous EEG monitoring.   Currently she is on Vimpat 150 mg every 12 as well as Keppra 1 g daily and an additional 250 mg with each HD.   Has been seizure-free.  Change to oral agents.  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   ID was consulted and recommending vancomycin with dialysis for total of 6 weeks with a last day 02/12/2021.   Continue vancomycin for now.  There are some reports that she might have missed dialysis and antibiotics however patient denies, and overall this is not clear.  Discussed with nephrology, they are not sure either. Wound is stable.   Paroxysmal atrial fibrillation with rapid ventricular response Heart rate is stable on metoprolol.  Also on Eliquis for anticoagulation.  ESRD, on HD.  Being dialyzed on TTS.  Nephrology is following.   HIV/oral thrush On antiretrovirals.  Initiate Diflucan.   Anemia of chronic disease, thrombocytopenia Stable.  No evidence for bleeding.  Severe protein calorie malnutrition  Cleared for oral intake by speech therapy.  Was on core track tube feeding.  GERD -PPI   Hyponatremia-volume managed with dialysis  Scheduled Meds:  amLODipine  10 mg Oral Daily   apixaban  2.5 mg Oral BID   [START ON 02/04/2021] calcitRIOL  1.25 mcg Oral Q T,Th,Sa-HD   calcium acetate (Phos Binder)  1,334 mg Oral TID WC   chlorhexidine  15 mL Mouth Rinse BID   Chlorhexidine Gluconate Cloth  6 each Topical Q0600   cinacalcet  90 mg  Oral Q T,Th,Sa-HD   clopidogrel  75 mg Oral Q breakfast   collagenase  1 application Topical Daily   darbepoetin (ARANESP) injection - DIALYSIS  40 mcg Intravenous Q Tue-HD   dolutegravir  50 mg Oral q1600   fluconazole  100 mg Oral Daily   hydrALAZINE  100 mg Oral Q8H   lamiVUDine  50 mg Oral Daily   lidocaine-prilocaine  1 application Topical Q T,Th,Sa-HD   mouth rinse  15  mL Mouth Rinse q12n4p   metoprolol tartrate  25 mg Oral BID   mirtazapine  15 mg Oral QHS   nystatin  5 mL Oral QID   pantoprazole (PROTONIX) IV  40 mg Intravenous QHS   polyethylene glycol  17 g Oral Daily   senna  2 tablet Oral QPM   thiamine injection  100 mg Intravenous Daily   vancomycin  500 mg Intravenous Q T,Th,Sa-HD   venlafaxine  75 mg Oral BID   Zidovudine  100 mg Oral Q8H   Continuous Infusions:  lacosamide (VIMPAT) IV 150 mg (02/01/21 2157)   levETIRAcetam 250 mg (02/01/21 1943)   levETIRAcetam 1,000 mg (02/02/21 0547)   PRN Meds:.acetaminophen **OR** acetaminophen, lanolin/mineral oil, lidocaine, loperamide, LORazepam, ondansetron **OR** ondansetron (ZOFRAN) IV, ondansetron  Diet Orders (From admission, onward)     Start     Ordered   01/31/21 1300  Diet regular Room service appropriate? Yes; Fluid consistency: Thin  Diet effective now       Question Answer Comment  Room service appropriate? Yes   Fluid consistency: Thin      01/31/21 1259            DVT prophylaxis: Continue Eliquis CODE STATUS: DNR Family communication: No family at bedside Disposition: Skilled nursing facility when bed is available.  Status is: Inpatient  Remains inpatient appropriate because: Encephalopathy, seizure    Consultants:  PCCM Nephrology  Procedures:  As above  Microbiology  none  Antimicrobials: Vancomycin    Objective: Vitals:   02/01/21 2143 02/02/21 0334 02/02/21 0539 02/02/21 0808  BP: (!) 164/97 (!) 157/86 (!) 158/86 (!) 163/94  Pulse:  67 68 67  Resp: (!) 21 16 16 16   Temp:  98.3 F (36.8 C) 98.5 F (36.9 C) 98 F (36.7 C)  TempSrc:  Oral Oral Oral  SpO2:  97% 100% 98%  Weight:        Intake/Output Summary (Last 24 hours) at 02/02/2021 0929 Last data filed at 02/01/2021 1223 Gross per 24 hour  Intake --  Output 3000 ml  Net -3000 ml    Filed Weights   02/01/21 0500 02/01/21 0848 02/01/21 1223  Weight: 44 kg 41.8 kg 38.5 kg     Examination:  General appearance: Awake alert.  In no distress.  Mildly distracted Resp: Clear to auscultation bilaterally.  Normal effort Cardio: S1-S2 is normal regular.  No S3-S4.  No rubs murmurs or bruit GI: Abdomen is soft.  Nontender nondistended.  Bowel sounds are present normal.  No masses organomegaly Extremities: No edema.  Moving all of her extremities. Wounds noted in the right upper extremity from recent infection of the fistula Neurologic: No focal neurological deficits.    Data Reviewed: I have independently reviewed following labs and imaging studies   CBC: Recent Labs  Lab 01/28/21 1650 01/29/21 0203 01/30/21 0206 01/31/21 0246 02/01/21 0608  WBC 4.1 5.3 5.2 4.3 4.1  HGB 9.1* 9.7* 9.4* 9.1* 8.9*  HCT 29.5* 31.1* 30.8* 30.0* 29.5*  MCV 97.7 96.3 98.1  98.7 102.4*  PLT 126* 122* 117* 114* 126*    Basic Metabolic Panel: Recent Labs  Lab 01/27/21 0443 01/28/21 0331 01/29/21 0203 01/29/21 1621 01/30/21 0206 01/30/21 1749 01/31/21 0246 01/31/21 2030 02/01/21 0608  NA 132* 131*  --   --   --  128* 133*  --  134*  K 4.2 4.4  --   --   --  4.2 3.2* 3.4* 3.8  CL 97* 95*  --   --   --  93* 100  --  101  CO2 21* 19*  --   --   --  24 26  --  24  GLUCOSE 77 81  --   --   --  92 102*  --  94  BUN 16 21  --   --   --  31* 5*  --  15  CREATININE 5.49* 7.06*  --   --   --  5.41* 2.02*  --  3.88*  CALCIUM 8.7* 8.7*  --   --   --  7.7* 8.5*  --  9.1  MG  --  1.9 1.7 1.8 1.8  --   --  1.9  --   PHOS 6.3* 8.0* 3.9 3.7 3.7 3.2  --   --   --     Liver Function Tests: Recent Labs  Lab 01/27/21 0443 01/28/21 0331 01/30/21 1749 01/31/21 0246 02/01/21 0608  AST  --   --   --  25 38  ALT  --   --   --  15 26  ALKPHOS  --   --   --  73 67  BILITOT  --   --   --  0.3 0.2*  PROT  --   --   --  7.1 7.0  ALBUMIN 2.7* 2.6* 2.5* 2.7* 2.7*    CBG: Recent Labs  Lab 02/01/21 2042 02/01/21 2140 02/02/21 0033 02/02/21 0428 02/02/21 0806  GLUCAP 74 74 76 80  76     Recent Results (from the past 240 hour(s))  Resp Panel by RT-PCR (Flu A&B, Covid) Nasopharyngeal Swab     Status: None   Collection Time: 01/23/21  3:04 PM   Specimen: Nasopharyngeal Swab; Nasopharyngeal(NP) swabs in vial transport medium  Result Value Ref Range Status   SARS Coronavirus 2 by RT PCR NEGATIVE NEGATIVE Final    Comment: (NOTE) SARS-CoV-2 target nucleic acids are NOT DETECTED.  The SARS-CoV-2 RNA is generally detectable in upper respiratory specimens during the acute phase of infection. The lowest concentration of SARS-CoV-2 viral copies this assay can detect is 138 copies/mL. A negative result does not preclude SARS-Cov-2 infection and should not be used as the sole basis for treatment or other patient management decisions. A negative result may occur with  improper specimen collection/handling, submission of specimen other than nasopharyngeal swab, presence of viral mutation(s) within the areas targeted by this assay, and inadequate number of viral copies(<138 copies/mL). A negative result must be combined with clinical observations, patient history, and epidemiological information. The expected result is Negative.  Fact Sheet for Patients:  EntrepreneurPulse.com.au  Fact Sheet for Healthcare Providers:  IncredibleEmployment.be  This test is no t yet approved or cleared by the [place] FDA and  has been authorized for detection and/or diagnosis of SARS-CoV-2 by FDA under an Emergency Use Authorization (EUA). This EUA will remain  in effect (meaning this test can be used) for the duration of the COVID-19 declaration under Section 564(b)(1) of the Act,  21 U.S.C.section 360bbb-3(b)(1), unless the authorization is terminated  or revoked sooner.       Influenza A by PCR NEGATIVE NEGATIVE Final   Influenza B by PCR NEGATIVE NEGATIVE Final    Comment: (NOTE) The Xpert Xpress SARS-CoV-2/FLU/RSV plus assay is intended  as an aid in the diagnosis of influenza from Nasopharyngeal swab specimens and should not be used as a sole basis for treatment. Nasal washings and aspirates are unacceptable for Xpert Xpress SARS-CoV-2/FLU/RSV testing.  Fact Sheet for Patients: EntrepreneurPulse.com.au  Fact Sheet for Healthcare Providers: IncredibleEmployment.be  This test is not yet approved or cleared by the [place] FDA and has been authorized for detection and/or diagnosis of SARS-CoV-2 by FDA under an Emergency Use Authorization (EUA). This EUA will remain in effect (meaning this test can be used) for the duration of the COVID-19 declaration under Section 564(b)(1) of the Act, 21 U.S.C. section 360bbb-3(b)(1), unless the authorization is terminated or revoked.  Performed at [facility], [address] [place], [place] [address]   Culture, blood (routine x 2)     Status: None   Collection Time: 01/24/21  1:31 PM   Specimen: BLOOD RIGHT HAND  Result Value Ref Range Status   Specimen Description BLOOD RIGHT HAND  Final   Special Requests   Final    Immunocompromised BOTTLES DRAWN AEROBIC AND ANAEROBIC Blood Culture results may not be optimal due to an inadequate volume of blood received in culture bottles   Culture   Final    NO GROWTH 5 DAYS Performed at [facility], [address] [place], [place] [address]    Report Status 01/29/2021 FINAL  Final  Culture, blood (routine x 2)     Status: None   Collection Time: 01/24/21  1:31 PM   Specimen: BLOOD LEFT HAND  Result Value Ref Range Status   Specimen Description BLOOD LEFT HAND  Final   Special Requests   Final    Immunocompromised BOTTLES DRAWN AEROBIC AND ANAEROBIC Blood Culture adequate volume   Culture   Final    NO GROWTH 5 DAYS Performed at [facility], [address], [place], [place] [address]    Report Status 01/29/2021 FINAL  Final  MRSA Next Gen by PCR, Nasal     Status: None   Collection Time:  01/25/21  4:14 AM  Result Value Ref Range Status   MRSA by PCR Next Gen NOT DETECTED NOT DETECTED Final    Comment: (NOTE) The GeneXpert MRSA Assay (FDA approved for NASAL specimens only), is one component of a comprehensive MRSA colonization surveillance program. It is not intended to diagnose MRSA infection nor to guide or monitor treatment for MRSA infections. Test performance is not FDA approved in patients less than 70 years old. Performed at [facility], [place] [address] [place], [place] [address]   CSF culture w Gram Stain     Status: None   Collection Time: 01/25/21  2:43 PM   Specimen: CSF; Cerebrospinal Fluid  Result Value Ref Range Status   Specimen Description CSF  Final   Special Requests Immunocompromised  Final   Gram Stain   Final    WBC PRESENT, PREDOMINANTLY PMN NO ORGANISMS SEEN CYTOSPIN SMEAR    Culture   Final    NO GROWTH Performed at [facility], [place] [address], [place], [place] [address]    Report Status 01/29/2021 FINAL  Final      Radiology Studies: No results found.  Bonnielee Haff 02/02/2021  [facility]

## 2021-02-02 NOTE — Progress Notes (Signed)
Palliative Medicine Inpatient Follow Up Note  HPI: [name] is a 70 year old female with PMH significant for ESRD on HD TTS, HIV, atrial flutter on Eliquis, anemia of chronic disease, chronic pain,GERD,  and recent infection involving right AV fistula status post washout and currently on IV vancomycin with wound vac. She was transferred to [facility] from [facility] ER where she was admitted after syncope while on hemodialysis with a recent development of intermittent confusion. She had a  seizure episode and was MRI raised possibility of PRES. Other concerns have been hypertension, elevated TSH and elevated T4 with no known hyperthyroidism history.   Received hemodialysis last night. She went into afib with RVR, started on amiodarone with heart rate control. LP ruled out acute meningitis/encephalitis. 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 Mental status is improving.   Per prior palliative care notes [name] had been fairly adamant in the past about not wishing for supplemental nutrition via feeding tube.  She had been educated on ways to increase her nutritional state though unfortunately she had been readmitted and remains to have insufficient nutritional support.  Today's Discussion (02/02/2021):  Chart reviewed.   I met with Autie at bedside this morning. She shares that she has been unable to sleep sufficiently in the setting of acute hospitalization.   We reviewed again [name]'s ongoing FTT. Discussed again that she does not desire a long term feeding tube. I shared that if her nutritional state gets to a point where she has little intake we would need to have further conversations about topics such as hospice. [name] is aware of this and reviews that we have talked about this prior. In the meanwhile reviewed continuing mirtazapine and possibly increasing this if she continues to have poor orals. Reviewed that this will also help with insomnia.    Have discussed oral thrush as well. Appears quite significant. Reviewed starting an oral medication in addition to nystatin for a week.   Questions and concerns answered  Palliative support provided  Objective Assessment: Vital Signs Vitals:   02/02/21 0539 02/02/21 0808  BP: (!) 158/86 (!) 163/94  Pulse: 68 67  Resp: 16 16  Temp: 98.5 F (36.9 C) 98 F (36.7 C)  SpO2: 100% 98%    Intake/Output Summary (Last 24 hours) at 02/02/2021 0932 Last data filed at 02/01/2021 1223 Gross per 24 hour  Intake --  Output 3000 ml  Net -3000 ml    Last Weight  Most recent update: 02/01/2021 12:40 PM    Weight  38.5 kg (84 lb 14 oz)            SUMMARY OF RECOMMENDATIONS   DNAR/DNI, No gastrostomy tube per patient  Ongoing palliative care support  Code Status/Advance Care Planning: DNAR/DNI, No gastrostomy tube --> Patient is clear about this and daughter is aware. MOST in Vynca reflects this  Limited additional interventions: use medical treatment, IV fluids and cardiac monitoring as indicated  Incremental PMT support  Plan for discharge back to [facility] when optimized  Symptom Management:  Pain- acetaminophen prn Nausea- ondansetron prn Oral Thrush- Fluconazole daily for 7 days, Nystatin QID for 7 days   Palliative Prophylaxis:  GI prophylaxis- pantoprazole Constipation- senna prn  Time Spent: 35 minutes Greater than 50% of the time was spent in counseling and coordination of care ______________________________________________________________________________________ [name], [facility] Team Team Cell Phone: [phone] Please utilize secure chat with additional questions, if there is no  response within 30 minutes please call the above phone number  Palliative Medicine Team providers are available by phone from 7am to 7pm daily and can be reached through the team cell phone.  Should this patient require assistance outside of  these hours, please call the patient's attending physician.

## 2021-02-03 ENCOUNTER — Encounter (HOSPITAL_COMMUNITY)
Admission: EM | Disposition: A | Discharge status: Skilled Nursing Facility | Payer: Self-pay | Source: Home / Self Care | Attending: Internal Medicine

## 2021-02-03 ENCOUNTER — Inpatient Hospital Stay (HOSPITAL_COMMUNITY): Payer: Medicare Other | Admitting: Anesthesiology

## 2021-02-03 DIAGNOSIS — N186 End stage renal disease: Secondary | ICD-10-CM

## 2021-02-03 DIAGNOSIS — I4811 Longstanding persistent atrial fibrillation: Secondary | ICD-10-CM | POA: Diagnosis not present

## 2021-02-03 DIAGNOSIS — I721 Aneurysm of artery of upper extremity: Secondary | ICD-10-CM

## 2021-02-03 DIAGNOSIS — Z992 Dependence on renal dialysis: Secondary | ICD-10-CM

## 2021-02-03 DIAGNOSIS — I10 Essential (primary) hypertension: Secondary | ICD-10-CM | POA: Diagnosis not present

## 2021-02-03 DIAGNOSIS — G9340 Encephalopathy, unspecified: Secondary | ICD-10-CM | POA: Diagnosis not present

## 2021-02-03 HISTORY — PX: VEIN HARVEST: SHX6363

## 2021-02-03 HISTORY — PX: APPLICATION OF A-CELL OF BACK: SHX6301

## 2021-02-03 HISTORY — PX: REVISON OF ARTERIOVENOUS FISTULA: SHX6074

## 2021-02-03 HISTORY — PX: APPLICATION OF WOUND VAC: SHX5189

## 2021-02-03 LAB — POCT I-STAT, CHEM 8
BUN: 18 mg/dL (ref 8–23)
Calcium, Ion: 1.17 mmol/L (ref 1.15–1.40)
Chloride: 101 mmol/L (ref 98–111)
Creatinine, Ser: 4.7 mg/dL — ABNORMAL HIGH (ref 0.44–1.00)
Glucose, Bld: 97 mg/dL (ref 70–99)
HCT: 33 % — ABNORMAL LOW (ref 36.0–46.0)
Hemoglobin: 11.2 g/dL — ABNORMAL LOW (ref 12.0–15.0)
Potassium: 4.2 mmol/L (ref 3.5–5.1)
Sodium: 135 mmol/L (ref 135–145)
TCO2: 25 mmol/L (ref 22–32)

## 2021-02-03 LAB — CBC
HCT: 27.7 % — ABNORMAL LOW (ref 36.0–46.0)
HCT: 26.5 % — ABNORMAL LOW (ref 36.0–46.0)
Hemoglobin: 8.1 g/dL — ABNORMAL LOW (ref 12.0–15.0)
Hemoglobin: 8.1 g/dL — ABNORMAL LOW (ref 12.0–15.0)
MCH: 30.3 pg (ref 26.0–34.0)
MCH: 31.2 pg (ref 26.0–34.0)
MCHC: 29.2 g/dL — ABNORMAL LOW (ref 30.0–36.0)
MCHC: 30.6 g/dL (ref 30.0–36.0)
MCV: 103.7 fL — ABNORMAL HIGH (ref 80.0–100.0)
MCV: 101.9 fL — ABNORMAL HIGH (ref 80.0–100.0)
Platelets: 192 10*3/uL (ref 150–400)
Platelets: 172 10*3/uL (ref 150–400)
RBC: 2.67 MIL/uL — ABNORMAL LOW (ref 3.87–5.11)
RBC: 2.6 MIL/uL — ABNORMAL LOW (ref 3.87–5.11)
RDW: 19.7 % — ABNORMAL HIGH (ref 11.5–15.5)
RDW: 19.3 % — ABNORMAL HIGH (ref 11.5–15.5)
WBC: 11.1 10*3/uL — ABNORMAL HIGH (ref 4.0–10.5)
WBC: 7.6 10*3/uL (ref 4.0–10.5)
nRBC: 0 % (ref 0.0–0.2)
nRBC: 0.3 % — ABNORMAL HIGH (ref 0.0–0.2)

## 2021-02-03 LAB — GLUCOSE, CAPILLARY
Glucose-Capillary: 77 mg/dL (ref 70–99)
Glucose-Capillary: 116 mg/dL — ABNORMAL HIGH (ref 70–99)
Glucose-Capillary: 127 mg/dL — ABNORMAL HIGH (ref 70–99)
Glucose-Capillary: 159 mg/dL — ABNORMAL HIGH (ref 70–99)
Glucose-Capillary: 131 mg/dL — ABNORMAL HIGH (ref 70–99)

## 2021-02-03 LAB — RESP PANEL BY RT-PCR (FLU A&B, COVID) ARPGX2
Influenza A by PCR: NEGATIVE
Influenza B by PCR: NEGATIVE
SARS Coronavirus 2 by RT PCR: NEGATIVE

## 2021-02-03 LAB — PREPARE RBC (CROSSMATCH)

## 2021-02-03 SURGERY — REVISON OF ARTERIOVENOUS FISTULA
Anesthesia: General | Site: Arm Upper | Laterality: Right

## 2021-02-03 MED ORDER — LEVETIRACETAM 1000 MG PO TABS: 1000.0000 mg | ORAL_TABLET | Freq: Every day | ORAL | Status: AC

## 2021-02-03 MED ORDER — ROCURONIUM BROMIDE 10 MG/ML (PF) SYRINGE
PREFILLED_SYRINGE | INTRAVENOUS | Status: AC
  Filled 2021-02-03: qty 10

## 2021-02-03 MED ORDER — AMLODIPINE BESYLATE 10 MG PO TABS: 10.0000 mg | ORAL_TABLET | Freq: Every day | ORAL | Status: AC

## 2021-02-03 MED ORDER — HYDRALAZINE HCL 100 MG PO TABS: 100.0000 mg | ORAL_TABLET | Freq: Three times a day (TID) | ORAL | Status: DC

## 2021-02-03 MED ORDER — METOPROLOL TARTRATE 25 MG PO TABS: 25.0000 mg | ORAL_TABLET | Freq: Two times a day (BID) | ORAL | Status: DC

## 2021-02-03 MED ORDER — MORPHINE SULFATE (PF) 2 MG/ML IV SOLN
2.0000 mg | Freq: Once | INTRAVENOUS | Status: AC
  Administered 2021-02-03: 2 mg via INTRAVENOUS

## 2021-02-03 MED ORDER — PROTAMINE SULFATE 10 MG/ML IV SOLN
INTRAVENOUS | Status: AC
  Filled 2021-02-03: qty 10

## 2021-02-03 MED ORDER — ONDANSETRON HCL 4 MG/2ML IJ SOLN
INTRAMUSCULAR | Status: DC | PRN
  Administered 2021-02-03: 4 mg via INTRAVENOUS

## 2021-02-03 MED ORDER — LACTATED RINGERS IV BOLUS
500.0000 mL | Freq: Once | INTRAVENOUS | Status: AC
  Administered 2021-02-03: 500 mL via INTRAVENOUS

## 2021-02-03 MED ORDER — 0.9 % SODIUM CHLORIDE (POUR BTL) OPTIME
TOPICAL | Status: DC | PRN
  Administered 2021-02-03: 1000 mL

## 2021-02-03 MED ORDER — MORPHINE SULFATE (PF) 2 MG/ML IV SOLN
INTRAVENOUS | Status: AC
  Filled 2021-02-03: qty 1

## 2021-02-03 MED ORDER — OXYCODONE-ACETAMINOPHEN 5-325 MG PO TABS
1.0000 | ORAL_TABLET | Freq: Four times a day (QID) | ORAL | Status: DC | PRN
  Administered 2021-02-05 – 2021-02-06 (×3): 2 via ORAL
  Filled 2021-02-03 (×3): qty 2

## 2021-02-03 MED ORDER — FLUCONAZOLE 100 MG PO TABS: 100.0000 mg | ORAL_TABLET | Freq: Every day | ORAL | Status: DC

## 2021-02-03 MED ORDER — VENLAFAXINE HCL 75 MG PO TABS: 75.0000 mg | ORAL_TABLET | Freq: Two times a day (BID) | ORAL | Status: AC

## 2021-02-03 MED ORDER — SUCCINYLCHOLINE CHLORIDE 200 MG/10ML IV SOSY
PREFILLED_SYRINGE | INTRAVENOUS | Status: DC | PRN
  Administered 2021-02-03: 100 mg via INTRAVENOUS

## 2021-02-03 MED ORDER — PROTAMINE SULFATE 10 MG/ML IV SOLN
INTRAVENOUS | Status: DC | PRN
  Administered 2021-02-03 (×2): 25 mg via INTRAVENOUS

## 2021-02-03 MED ORDER — PROPOFOL 10 MG/ML IV BOLUS
INTRAVENOUS | Status: AC
  Filled 2021-02-03: qty 20

## 2021-02-03 MED ORDER — LACOSAMIDE 150 MG PO TABS: 150.0000 mg | ORAL_TABLET | Freq: Two times a day (BID) | ORAL | Status: AC

## 2021-02-03 MED ORDER — SUGAMMADEX SODIUM 200 MG/2ML IV SOLN
INTRAVENOUS | Status: DC | PRN
  Administered 2021-02-03: 200 mg via INTRAVENOUS

## 2021-02-03 MED ORDER — PHENYLEPHRINE 40 MCG/ML (10ML) SYRINGE FOR IV PUSH (FOR BLOOD PRESSURE SUPPORT)
PREFILLED_SYRINGE | INTRAVENOUS | Status: DC | PRN
  Administered 2021-02-03: 80 ug via INTRAVENOUS
  Administered 2021-02-03: 160 ug via INTRAVENOUS

## 2021-02-03 MED ORDER — THIAMINE HCL 100 MG PO TABS: 100.0000 mg | ORAL_TABLET | Freq: Every day | ORAL | Status: DC

## 2021-02-03 MED ORDER — OXYCODONE HCL 5 MG PO TABS
5.0000 mg | ORAL_TABLET | Freq: Once | ORAL | Status: AC | PRN
  Administered 2021-02-03: 5 mg via ORAL

## 2021-02-03 MED ORDER — CALCITRIOL 0.25 MCG PO CAPS: 1.2500 ug | ORAL_CAPSULE | ORAL | Status: AC

## 2021-02-03 MED ORDER — DEXAMETHASONE SODIUM PHOSPHATE 10 MG/ML IJ SOLN
INTRAMUSCULAR | Status: DC | PRN
  Administered 2021-02-03: 10 mg via INTRAVENOUS

## 2021-02-03 MED ORDER — HEPARIN 6000 UNIT IRRIGATION SOLUTION
Status: AC
  Filled 2021-02-03: qty 500

## 2021-02-03 MED ORDER — CALCIUM ACETATE (PHOS BINDER) 667 MG/5ML PO SOLN: 1334.0000 mg | Freq: Three times a day (TID) | ORAL | Status: DC

## 2021-02-03 MED ORDER — ONDANSETRON HCL 4 MG/2ML IJ SOLN: 4.0000 mg | Freq: Once | INTRAMUSCULAR | Status: DC | PRN

## 2021-02-03 MED ORDER — HEMOSTATIC AGENTS (NO CHARGE) OPTIME
TOPICAL | Status: DC | PRN
  Administered 2021-02-03: 1 via TOPICAL

## 2021-02-03 MED ORDER — PHENYLEPHRINE HCL-NACL 20-0.9 MG/250ML-% IV SOLN
INTRAVENOUS | Status: DC | PRN
  Administered 2021-02-03: 25 ug/min via INTRAVENOUS

## 2021-02-03 MED ORDER — ZIDOVUDINE 100 MG PO CAPS
100.0000 mg | ORAL_CAPSULE | Freq: Three times a day (TID) | ORAL | Status: DC
  Administered 2021-02-03 – 2021-02-10 (×19): 100 mg via ORAL
  Filled 2021-02-03 (×26): qty 1

## 2021-02-03 MED ORDER — POLYETHYLENE GLYCOL 3350 17 G PO PACK: 17.0000 g | PACK | Freq: Every day | ORAL | 0 refills | Status: AC | PRN

## 2021-02-03 MED ORDER — HEPARIN SODIUM (PORCINE) 1000 UNIT/ML IJ SOLN
INTRAMUSCULAR | Status: AC
  Filled 2021-02-03: qty 10

## 2021-02-03 MED ORDER — PROTAMINE SULFATE 10 MG/ML IV SOLN
INTRAVENOUS | Status: AC
  Filled 2021-02-03: qty 5

## 2021-02-03 MED ORDER — LIDOCAINE 2% (20 MG/ML) 5 ML SYRINGE
INTRAMUSCULAR | Status: AC
  Filled 2021-02-03: qty 5

## 2021-02-03 MED ORDER — FENTANYL CITRATE (PF) 250 MCG/5ML IJ SOLN
INTRAMUSCULAR | Status: AC
  Filled 2021-02-03: qty 5

## 2021-02-03 MED ORDER — POLYETHYLENE GLYCOL 3350 17 G PO PACK
17.0000 g | PACK | Freq: Every day | ORAL | Status: DC | PRN
  Administered 2021-02-05: 17 g via ORAL
  Filled 2021-02-03: qty 1

## 2021-02-03 MED ORDER — METHADONE HCL 5 MG PO TABS: 2.5000 mg | ORAL_TABLET | Freq: Every day | ORAL | 0 refills | Status: DC

## 2021-02-03 MED ORDER — HYDROMORPHONE HCL 1 MG/ML IJ SOLN
0.5000 mg | INTRAMUSCULAR | Status: DC | PRN
  Administered 2021-02-03: 0.5 mg via INTRAVENOUS
  Filled 2021-02-03: qty 0.5

## 2021-02-03 MED ORDER — MIDAZOLAM HCL 2 MG/2ML IJ SOLN
INTRAMUSCULAR | Status: AC
  Filled 2021-02-03: qty 2

## 2021-02-03 MED ORDER — NYSTATIN 100000 UNIT/ML MT SUSP: 5.0000 mL | Freq: Four times a day (QID) | OROMUCOSAL | 0 refills | Status: AC

## 2021-02-03 MED ORDER — OXYCODONE HCL 5 MG/5ML PO SOLN: 5.0000 mg | Freq: Once | ORAL | Status: AC | PRN

## 2021-02-03 MED ORDER — FENTANYL CITRATE (PF) 100 MCG/2ML IJ SOLN
25.0000 ug | INTRAMUSCULAR | Status: DC | PRN
  Administered 2021-02-03: 25 ug via INTRAVENOUS

## 2021-02-03 MED ORDER — CINACALCET HCL 30 MG PO TABS: 90.0000 mg | ORAL_TABLET | ORAL | Status: AC

## 2021-02-03 MED ORDER — PROPOFOL 10 MG/ML IV BOLUS
INTRAVENOUS | Status: DC | PRN
  Administered 2021-02-03: 80 mg via INTRAVENOUS

## 2021-02-03 MED ORDER — SODIUM CHLORIDE 0.9% IV SOLUTION: Freq: Once | INTRAVENOUS | Status: DC

## 2021-02-03 MED ORDER — ONDANSETRON HCL 4 MG/2ML IJ SOLN
INTRAMUSCULAR | Status: AC
  Filled 2021-02-03: qty 2

## 2021-02-03 MED ORDER — FENTANYL CITRATE (PF) 250 MCG/5ML IJ SOLN
INTRAMUSCULAR | Status: DC | PRN
  Administered 2021-02-03 (×2): 50 ug via INTRAVENOUS

## 2021-02-03 MED ORDER — PHENYLEPHRINE 40 MCG/ML (10ML) SYRINGE FOR IV PUSH (FOR BLOOD PRESSURE SUPPORT)
PREFILLED_SYRINGE | INTRAVENOUS | Status: AC
  Filled 2021-02-03: qty 10

## 2021-02-03 MED ORDER — LEVETIRACETAM 250 MG PO TABS: 250.0000 mg | ORAL_TABLET | ORAL | Status: AC

## 2021-02-03 MED ORDER — HEPARIN 6000 UNIT IRRIGATION SOLUTION
Status: DC | PRN
  Administered 2021-02-03: 1

## 2021-02-03 MED ORDER — SUCCINYLCHOLINE CHLORIDE 200 MG/10ML IV SOSY
PREFILLED_SYRINGE | INTRAVENOUS | Status: AC
  Filled 2021-02-03: qty 10

## 2021-02-03 MED ORDER — OXYCODONE HCL 5 MG PO TABS
ORAL_TABLET | ORAL | Status: AC
  Filled 2021-02-03: qty 1

## 2021-02-03 MED ORDER — FENTANYL CITRATE (PF) 100 MCG/2ML IJ SOLN
INTRAMUSCULAR | Status: AC
  Filled 2021-02-03: qty 2

## 2021-02-03 MED ORDER — LACTATED RINGERS IV SOLN: INTRAVENOUS | Status: DC | PRN

## 2021-02-03 MED ORDER — HEPARIN SODIUM (PORCINE) 1000 UNIT/ML IJ SOLN
INTRAMUSCULAR | Status: DC | PRN
  Administered 2021-02-03: 5000 [IU] via INTRAVENOUS

## 2021-02-03 MED ORDER — ROCURONIUM BROMIDE 10 MG/ML (PF) SYRINGE
PREFILLED_SYRINGE | INTRAVENOUS | Status: DC | PRN
  Administered 2021-02-03: 50 mg via INTRAVENOUS

## 2021-02-03 SURGICAL SUPPLY — 49 items
ADH SKN CLS APL DERMABOND .7 (GAUZE/BANDAGES/DRESSINGS) ×2
ARMBAND PINK RESTRICT EXTREMIT (MISCELLANEOUS) ×3 IMPLANT
BAG COUNTER SPONGE SURGICOUNT (BAG) ×3 IMPLANT
BAG SPNG CNTER NS LX DISP (BAG) ×2
CANISTER SUCT 3000ML PPV (MISCELLANEOUS) ×3 IMPLANT
CANNULA VESSEL 3MM 2 BLNT TIP (CANNULA) ×6 IMPLANT
CATH EMB LATEX FREE 3FRX80CM (CATHETERS) ×3
CATH EMB LF 3FRX80 (CATHETERS) ×2 IMPLANT
CLIP VESOCCLUDE MED 6/CT (CLIP) ×3 IMPLANT
CLIP VESOCCLUDE SM WIDE 6/CT (CLIP) ×3 IMPLANT
COVER PROBE W GEL 5X96 (DRAPES) IMPLANT
CUFF TOURN SGL QUICK 18 NS (TOURNIQUET CUFF) ×6 IMPLANT
DERMABOND ADVANCED (GAUZE/BANDAGES/DRESSINGS) ×1
DERMABOND ADVANCED .7 DNX12 (GAUZE/BANDAGES/DRESSINGS) ×2 IMPLANT
DRAPE LAPAROTOMY 100X72 PEDS (DRAPES) ×3 IMPLANT
DRSG EMULSION OIL 3X3 NADH (GAUZE/BANDAGES/DRESSINGS) ×3 IMPLANT
DRSG MEPILEX BORDER 4X8 (GAUZE/BANDAGES/DRESSINGS) ×3 IMPLANT
DRSG VAC ATS SM SENSATRAC (GAUZE/BANDAGES/DRESSINGS) ×3 IMPLANT
ELECT REM PT RETURN 9FT ADLT (ELECTROSURGICAL) ×3
ELECTRODE REM PT RTRN 9FT ADLT (ELECTROSURGICAL) ×2 IMPLANT
GAUZE 4X4 16PLY ~~LOC~~+RFID DBL (SPONGE) ×3 IMPLANT
GLOVE SRG 8 PF TXTR STRL LF DI (GLOVE) ×2 IMPLANT
GLOVE SURG ENC MOIS LTX SZ7.5 (GLOVE) ×3 IMPLANT
GLOVE SURG POLY ORTHO LF SZ7.5 (GLOVE) IMPLANT
GLOVE SURG UNDER LTX SZ8 (GLOVE) ×3 IMPLANT
GLOVE SURG UNDER POLY LF SZ8 (GLOVE) ×3
GOWN STRL REUS W/ TWL LRG LVL3 (GOWN DISPOSABLE) ×6 IMPLANT
GOWN STRL REUS W/TWL LRG LVL3 (GOWN DISPOSABLE) ×9
GRAFT MYRIAD 5 LAYER 7X10 (Graft) ×3 IMPLANT
HEMOSTAT SNOW SURGICEL 2X4 (HEMOSTASIS) ×3 IMPLANT
KIT BASIN OR (CUSTOM PROCEDURE TRAY) ×3 IMPLANT
KIT TURNOVER KIT B (KITS) ×3 IMPLANT
NS IRRIG 1000ML POUR BTL (IV SOLUTION) ×3 IMPLANT
PACK CV ACCESS (CUSTOM PROCEDURE TRAY) ×3 IMPLANT
PAD ARMBOARD 7.5X6 YLW CONV (MISCELLANEOUS) ×6 IMPLANT
SPONGE SURGIFOAM ABS GEL 100 (HEMOSTASIS) IMPLANT
STOPCOCK 4 WAY LG BORE MALE ST (IV SETS) ×3 IMPLANT
SUT CHROMIC 3 0 SH 27 (SUTURE) ×3 IMPLANT
SUT MNCRL AB 4-0 PS2 18 (SUTURE) ×3 IMPLANT
SUT PROLENE 4 0 RB 1 (SUTURE) ×6
SUT PROLENE 4-0 RB1 .5 CRCL 36 (SUTURE) ×4 IMPLANT
SUT PROLENE 5 0 C 1 24 (SUTURE) ×3 IMPLANT
SUT PROLENE 5 0 C 1 36 (SUTURE) ×3 IMPLANT
SUT PROLENE 6 0 BV (SUTURE) ×18 IMPLANT
SUT VIC AB 3-0 SH 27 (SUTURE) ×9
SUT VIC AB 3-0 SH 27X BRD (SUTURE) ×6 IMPLANT
TOWEL GREEN STERILE (TOWEL DISPOSABLE) ×3 IMPLANT
UNDERPAD 30X36 HEAVY ABSORB (UNDERPADS AND DIAPERS) ×3 IMPLANT
WATER STERILE IRR 1000ML POUR (IV SOLUTION) ×3 IMPLANT

## 2021-02-03 NOTE — Progress Notes (Signed)
SLP Cancellation Note  Patient Details Name: [name] MRN: [id number] DOB: 1950-11-24   Cancelled treatment:       Reason Eval/Treat Not Completed: Patient at procedure or test/unavailable.  Himani Corona L. Tivis Ringer, [facility] CCC/SLP Acute Rehabilitation Services Office number [phone] Pager [phone]    Juan Quam Laurice 02/03/2021, 2:20 PM

## 2021-02-03 NOTE — Plan of Care (Signed)
  Problem: Education: Goal: Knowledge of General Education information will improve Description: Including pain rating scale, medication(s)/side effects and non-pharmacologic comfort measures Outcome: Progressing   Problem: Activity: Goal: Risk for activity intolerance will decrease Outcome: Progressing   Problem: Nutrition: Goal: Adequate nutrition will be maintained Outcome: Progressing   

## 2021-02-03 NOTE — Anesthesia Preprocedure Evaluation (Signed)
Anesthesia Evaluation    Reviewed: Allergy & Precautions, Patient's Chart, lab work & pertinent test results, Unable to perform ROS - Chart review onlyPreop documentation limited or incomplete due to emergent nature of procedure.  History of Anesthesia Complications Negative for: history of anesthetic complications  Airway        Dental   Pulmonary former smoker,           Cardiovascular hypertension, Pt. on medications pulmonary hypertension+ CAD and +CHF  + dysrhythmias Atrial Fibrillation    '22 TTE - EF 60 to 65%. Left atrial size was mildly dilated. Trivial mitral valve regurgitation.     Neuro/Psych  Headaches, Seizures -,  PSYCHIATRIC DISORDERS Anxiety Depression TIA   GI/Hepatic PUD, GERD  ,  Endo/Other    Renal/GU ESRFRenal disease     Musculoskeletal   Abdominal   Peds  Hematology  (+) anemia , HIV,  On eliquis and plavix Plt 126k    Anesthesia Other Findings   Reproductive/Obstetrics                             Anesthesia Physical Anesthesia Plan  ASA: 4 and emergent  Anesthesia Plan: General   Post-op Pain Management:    Induction: Intravenous and Rapid sequence  PONV Risk Score and Plan: 3 and Treatment may vary due to age or medical condition, Ondansetron, Dexamethasone and Midazolam  Airway Management Planned: Oral ETT  Additional Equipment:   Intra-op Plan:   Post-operative Plan: Extubation in OR  Informed Consent:   Patient has DNR.   Only emergency history available  Plan Discussed with: CRNA and Anesthesiologist  Anesthesia Plan Comments:         Anesthesia Quick Evaluation

## 2021-02-03 NOTE — Progress Notes (Signed)
Called to evaluate oozing from right thigh dressing.  This is the saphenous vein harvest incision and was reinforced in PACU.  Appears dry and no active bleeding on 5W.  Palpable radial pulse at wrist after brachial artery bypass and vac dry to right upper extremity.    Marty Heck, MD [facility] [place] Office: [place]

## 2021-02-03 NOTE — Transfer of Care (Signed)
Immediate Anesthesia Transfer of Care Note  Patient: [name]  Procedure(s) Performed: EXPLORATION OF RIGHT ARTERIOVENOUS FISTULA  AND RIGHT BRACHIAL TO BRACHIAL BYPASS (Right) VEIN HARVEST OF UPPER RIGHT LEG (Right) APPLICATION OF WOUND VAC (Right: Arm Upper) APPLICATION OF MYRIAD MATRIX (Right: Arm Upper)  Patient Location: PACU  Anesthesia Type:General  Level of Consciousness: drowsy  Airway & Oxygen Therapy: Patient Spontanous Breathing and Patient connected to face mask oxygen  Post-op Assessment: Report given to RN and Post -op Vital signs reviewed and stable  Post vital signs: Reviewed and stable  Last Vitals:  Vitals Value Taken Time  BP 142/83 02/03/21 1237  Temp    Pulse 57 02/03/21 1238  Resp 19 02/03/21 1238  SpO2 100 % 02/03/21 1238  Vitals shown include unvalidated device data.  Last Pain:  Vitals:   02/03/21 0752  TempSrc: Oral  PainSc: 0-No pain      Patients Stated Pain Goal: 4 (71/24/58 0998)  Complications: No notable events documented.

## 2021-02-03 NOTE — Progress Notes (Signed)
PROGRESS NOTE  [name] TKW:[id number] DOB: 1950/05/21 DOA: 01/23/2021 PCP: Hilbert Corrigan, MD   LOS: 11 days   Brief Narrative / Interim history: 70 year old female who has a history of a flutter on Eliquis, pulmonary hypertension, HIV, GERD, ESRD on TTS HD, fistula infection on vent treatment with HD as an outpatient but not consistently administered due to early termination of her HD, was brought to [facility] on 11/24 due to a syncopal episode while undergoing HD.  She also had intermittent confusion at that time.  After admission, on 11/25 became hypertensive, had a seizure and neurology was consulted.  An MRI of the brain was concerning for press.  She was started on Cardene drip and she was transferred to [facility] for ICU management and continuous EEG.  Her mental status continues to improve.  On 11/28 failed bedside speech eval and core track was placed.  On 11/29 her continuous EEG has been discontinued, and with continued improvement she was transferred to the hospitalist service on 12/1.  Nephrology also following for her dialysis needs.  Subjective: Patient mention having had 2 loose bowel movements overnight.  Denies any abdominal pain nausea or vomiting.  Feels well for the most part.  Assessment & Plan:  Acute encephalopathy with press, hypertensive emergency Patient had a seizure episode in the setting of hypertension. MRI showed cortical and subcortical edema in the occipital parietal lobes bilaterally.  She was subsequently admitted to the ICU, improved and transferred to the hospitalist service on 12/1.  Current antihypertensive regimen includes amlodipine 10 mg daily, metoprolol 25 mg twice daily.   Patient's mentation seems to be back to baseline.    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  EEG initially showed nonconvulsive status epilepticus.  She underwent continuous EEG monitoring.   Currently she is on Vimpat 150 mg  every 12 as well as Keppra 1 g daily and an additional 250 mg with each HD.   Has been seizure-free.    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   ID was consulted and recommending vancomycin with dialysis for total of 6 weeks with a last day 02/12/2021.   Continue vancomycin for now.  There are some reports that she might have missed dialysis and antibiotics however patient denies, and overall this is not clear.  Discussed with nephrology, they are not sure either. Wound has been stable.  ADDENDUM Patient's fistula blew out this morning after I had seen her.  Seen by vascular surgeon urgently and was taken to the OR.  Paroxysmal atrial fibrillation with rapid ventricular response Heart rate is stable on metoprolol.  Also on Eliquis for anticoagulation.  May need to hold Eliquis due to bleeding from fistula.  We will see what vascular says after she comes back from the OR.  ESRD, on HD.  Being dialyzed on TTS schedule.  Nephrology is following.   HIV/oral thrush On antiretrovirals.  Initiated Diflucan.   Anemia of chronic disease, thrombocytopenia Stable.  Bleeding from fistula noted this morning as mentioned above.  We will need to check her hemoglobin  Severe protein calorie malnutrition  Cleared for oral intake by speech therapy.  Was on core track tube feeding.  GERD -PPI   Hyponatremia-volume managed with dialysis   DVT prophylaxis: Continue Eliquis CODE STATUS: DNR Family communication: No family at bedside Disposition:  Skilled nursing facility when bed is available.  Status is: Inpatient  Remains inpatient appropriate because: Encephalopathy, seizure   Scheduled Meds:  [MAR Hold] sodium chloride   Intravenous Once   [MAR Hold] sodium chloride   Intravenous Once   [MAR Hold] amLODipine  10 mg Oral Daily   [MAR Hold] apixaban  2.5 mg Oral  BID   [MAR Hold] calcitRIOL  1.25 mcg Oral Q T,Th,Sa-HD   [MAR Hold] calcium acetate (Phos Binder)  1,334 mg Oral TID WC   [MAR Hold] chlorhexidine  15 mL Mouth Rinse BID   [MAR Hold] Chlorhexidine Gluconate Cloth  6 each Topical Q0600   [MAR Hold] cinacalcet  90 mg Oral Q T,Th,Sa-HD   [MAR Hold] clopidogrel  75 mg Oral Q breakfast   [MAR Hold] collagenase  1 application Topical Daily   [MAR Hold] darbepoetin (ARANESP) injection - DIALYSIS  40 mcg Intravenous Q Tue-HD   [MAR Hold] dolutegravir  50 mg Oral q1600   [MAR Hold] fluconazole  100 mg Oral Daily   [MAR Hold] hydrALAZINE  100 mg Oral Q8H   [MAR Hold] lacosamide  150 mg Oral BID   [MAR Hold] lamiVUDine  50 mg Oral Daily   [MAR Hold] levETIRAcetam  1,000 mg Oral Daily   [MAR Hold] levETIRAcetam  250 mg Oral Q T,Th,Sat-1800   [MAR Hold] lidocaine-prilocaine  1 application Topical Q T,Th,Sa-HD   [MAR Hold] mouth rinse  15 mL Mouth Rinse q12n4p   [MAR Hold] metoprolol tartrate  25 mg Oral BID   [MAR Hold] mirtazapine  15 mg Oral QHS   morphine       [MAR Hold] nystatin  5 mL Oral QID   [MAR Hold] pantoprazole  40 mg Oral QHS   [MAR Hold] senna  2 tablet Oral QPM   [MAR Hold] thiamine  100 mg Oral Daily   [MAR Hold] vancomycin  500 mg Intravenous Q T,Th,Sa-HD   [MAR Hold] venlafaxine  75 mg Oral BID   [MAR Hold] zidovudine  100 mg Oral Q8H   Continuous Infusions:   PRN Meds:.0.9 % irrigation (POUR BTL), [MAR Hold] acetaminophen **OR** [MAR Hold] acetaminophen, heparin, [MAR Hold] lanolin/mineral oil, [MAR Hold] lidocaine, [MAR Hold] loperamide, [MAR Hold] LORazepam, [MAR Hold] ondansetron **OR** [MAR Hold] ondansetron (ZOFRAN) IV, [MAR Hold] ondansetron, [MAR Hold] polyethylene glycol  Diet Orders (From admission, onward)     Start     Ordered   01/31/21 1300  Diet regular Room service appropriate? Yes; Fluid consistency: Thin  Diet effective now       Question Answer Comment  Room service appropriate? Yes   Fluid  consistency: Thin      01/31/21 1259               Consultants:  PCCM Nephrology  Procedures:  As above  Microbiology  none  Antimicrobials: Vancomycin    Objective: Vitals:   02/02/21 2346 02/03/21 0500 02/03/21 0532 02/03/21 0752  BP: (!) 150/82  (!) 144/88 (!) 152/94  Pulse: 66  68 70  Resp: 19  12 14   Temp: 98.1 F (36.7 C)  98.1 F (36.7 C) 98.3 F (36.8 C)  TempSrc: Oral  Oral Oral  SpO2: 100%  100%   Weight:  39.4 kg     No intake or output data in the 24 hours ending 02/03/21 1059  Filed Weights   02/01/21 0848 02/01/21 1223 02/03/21 0500  Weight: 41.8 kg 38.5 kg 39.4 kg    Examination:  General appearance:  Awake alert.  In no distress Resp: Clear to auscultation bilaterally.  Normal effort Cardio: S1-S2 is normal regular.  No S3-S4.  No rubs murmurs or bruit GI: Abdomen is soft.  Nontender nondistended.  Bowel sounds are present normal.  No masses organomegaly Extremities: No edema.  Full range of motion of lower extremities. Neurologic:   No focal neurological deficits.    Data Reviewed: I have independently reviewed following labs and imaging studies   CBC: Recent Labs  Lab 01/28/21 1650 01/29/21 0203 01/30/21 0206 01/31/21 0246 02/01/21 0608  WBC 4.1 5.3 5.2 4.3 4.1  HGB 9.1* 9.7* 9.4* 9.1* 8.9*  HCT 29.5* 31.1* 30.8* 30.0* 29.5*  MCV 97.7 96.3 98.1 98.7 102.4*  PLT 126* 122* 117* 114* 126*    Basic Metabolic Panel: Recent Labs  Lab 01/28/21 0331 01/29/21 0203 01/29/21 1621 01/30/21 0206 01/30/21 1749 01/31/21 0246 01/31/21 2030 02/01/21 0608  NA 131*  --   --   --  128* 133*  --  134*  K 4.4  --   --   --  4.2 3.2* 3.4* 3.8  CL 95*  --   --   --  93* 100  --  101  CO2 19*  --   --   --  24 26  --  24  GLUCOSE 81  --   --   --  92 102*  --  94  BUN 21  --   --   --  31* 5*  --  15  CREATININE 7.06*  --   --   --  5.41* 2.02*  --  3.88*  CALCIUM 8.7*  --   --   --  7.7* 8.5*  --  9.1  MG 1.9 1.7 1.8 1.8  --   --   1.9  --   PHOS 8.0* 3.9 3.7 3.7 3.2  --   --   --     Liver Function Tests: Recent Labs  Lab 01/28/21 0331 01/30/21 1749 01/31/21 0246 02/01/21 0608  AST  --   --  25 38  ALT  --   --  15 26  ALKPHOS  --   --  73 67  BILITOT  --   --  0.3 0.2*  PROT  --   --  7.1 7.0  ALBUMIN 2.6* 2.5* 2.7* 2.7*    CBG: Recent Labs  Lab 02/02/21 0806 02/02/21 1157 02/02/21 1555 02/02/21 2126 02/03/21 0828  GLUCAP 76 87 97 108* 77     Recent Results (from the past 240 hour(s))  Culture, blood (routine x 2)     Status: None   Collection Time: 01/24/21  1:31 PM   Specimen: BLOOD RIGHT HAND  Result Value Ref Range Status   Specimen Description BLOOD RIGHT HAND  Final   Special Requests   Final    Immunocompromised BOTTLES DRAWN AEROBIC AND ANAEROBIC Blood Culture results may not be optimal due to an inadequate volume of blood received in culture bottles   Culture   Final    NO GROWTH 5 DAYS Performed at [facility], [address] [place], [place] [address]    Report Status 01/29/2021 FINAL  Final  Culture, blood (routine x 2)     Status: None   Collection Time: 01/24/21  1:31 PM   Specimen: BLOOD LEFT HAND  Result Value Ref Range Status   Specimen Description BLOOD LEFT HAND  Final   Special Requests   Final    Immunocompromised  BOTTLES DRAWN AEROBIC AND ANAEROBIC Blood Culture adequate volume   Culture   Final    NO GROWTH 5 DAYS Performed at [facility], [address], [place], [place] [address]    Report Status 01/29/2021 FINAL  Final  MRSA Next Gen by PCR, Nasal     Status: None   Collection Time: 01/25/21  4:14 AM  Result Value Ref Range Status   MRSA by PCR Next Gen NOT DETECTED NOT DETECTED Final    Comment: (NOTE) The GeneXpert MRSA Assay (FDA approved for NASAL specimens only), is one component of a comprehensive MRSA colonization surveillance program. It is not intended to diagnose MRSA infection nor to guide or monitor treatment for MRSA infections. Test  performance is not FDA approved in patients less than 35 years old. Performed at [facility], [place] [address], [place], [place] [address]   CSF culture w Gram Stain     Status: None   Collection Time: 01/25/21  2:43 PM   Specimen: CSF; Cerebrospinal Fluid  Result Value Ref Range Status   Specimen Description CSF  Final   Special Requests Immunocompromised  Final   Gram Stain   Final    WBC PRESENT, PREDOMINANTLY PMN NO ORGANISMS SEEN CYTOSPIN SMEAR    Culture   Final    NO GROWTH Performed at [facility], [place] [address], [place],  [address]    Report Status 01/29/2021 FINAL  Final      Radiology Studies: No results found.  Bonnielee Haff 02/03/2021  [facility]

## 2021-02-03 NOTE — Anesthesia Procedure Notes (Signed)
Procedure Name: Intubation Date/Time: 02/03/2021 9:44 AM Performed by: Lavell Luster, CRNA Pre-anesthesia Checklist: Patient identified, Emergency Drugs available, Suction available and Patient being monitored Patient Re-evaluated:Patient Re-evaluated prior to induction Oxygen Delivery Method: Circle system utilized Preoxygenation: Pre-oxygenation with 100% oxygen Induction Type: IV induction and Rapid sequence Laryngoscope Size: Mac and 4 Grade View: Grade I Tube type: Oral Tube size: 7.0 mm Number of attempts: 1 Airway Equipment and Method: Stylet and Oral airway Placement Confirmation: ETT inserted through vocal cords under direct vision, positive ETCO2 and breath sounds checked- equal and bilateral Secured at: 19 cm Tube secured with: Tape Dental Injury: Teeth and Oropharynx as per pre-operative assessment

## 2021-02-03 NOTE — Progress Notes (Addendum)
Notified by RN that pt with low HR. Pt with HR in 40-50 and dipped down to 37 for 15-20 seconds while sleeping.  Pt had general anesthesia today for emergency procedure. Pt has ESRD and HD 3 days per week, last HD was 2 days ago.  Pt now awake and HR is 50-65  EKG shows afib with HR of 67. HR on monitor is 55-62.  Pt with no chest pain or complaints. Pt with minimal PO intake today If bradycardia persists or is prolonged will give dose of atropine.  BP is soft. Give small LR bolus now.

## 2021-02-03 NOTE — Progress Notes (Signed)
Patient arrived back to unit, bed [id number]. Patient drowsy but opens eyes spontaneously and responds appropriately. Wound vac to right upper extremity in antecubital area, right thigh has dressing on that is clean, dry, and intact (this was just changed in PACU due to drainage). Per PACU attempted to call surgeon and PA due to staples looking like they are coming out of patient's skin. On call VVS, Dr Carlis Abbott paged and called to make aware of PACU report when patient on way to room. RN will continue to monitor dressings and patient.

## 2021-02-03 NOTE — Progress Notes (Signed)
Called again for oozing from the saphenectomy harvest site in the thigh as well as oozing under the VAC to the right arm.  Thigh dressing was removed and no appreciable hematoma.  She is on Eliquis and is oozing from the thigh incision.  Surgicel was placed on the incision with Kerlix/Ace for pressure dressing.  I also removed right upper extremity VAC given now leaking and again oozing from the incisions with no appreciable hematoma.  Moist dressing placed on graft with Surgicel over the other incisions and wrapped with Kerlix and Ace for gentle pressure dressing.  Palpable radial pulse.  Discussed with hospitalist to hold Eliquis for now.  Marty Heck, MD [facility] [place] Office: [place]

## 2021-02-03 NOTE — Progress Notes (Signed)
Spoke to patient's daughter on phone and updated on patient's status and her currently being in the OR. She stated she would probably come by later this evening.

## 2021-02-03 NOTE — Op Note (Signed)
DATE OF SERVICE: 02/03/2021  PATIENT:  [name]  70 y.o. 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  ANESTHESIA:   general  EBL: 120m  BLOOD ADMINISTERED:none  DRAINS: none   LOCAL MEDICATIONS USED:  NONE  SPECIMEN:  none  COUNTS: confirmed correct.  TOURNIQUET:    Total Tourniquet Time Documented: Upper Arm (Right) - 28 minutes Total: Upper Arm (Right) - 28 minutes  PATIENT DISPOSITION:  PACU - hemodynamically stable.   Delay start of Pharmacological VTE agent (>24hrs) due to surgical blood loss or risk of bleeding: no  INDICATION FOR PROCEDURE: Tina DIVELBISSis a 70y.o. female with end-stage renal disease who previously underwent fistula ligation and ultimately had skin substitute reconstruction of a complex wound created as a result of an ulcerated fistula.  She was previously followed in the office.  She is recently admitted with PRES. upon inspection in the hospital today, she had rapid bleeding from the operative site.  She was taken emergently to the operating room for hemorrhage control.  Informed consent was not obtained given the life-threatening nature of this procedure.  Both Dr. CDonzetta Mattersand I agreed that this needed to be done to save her life.  OPERATIVE FINDINGS: Complete loss of the anterior wall of the brachial artery at the antecubital fossa.  Hemorrhage control obtained initially with pneumatic  tourniquet.  Incision in the mid arm allowed for inflow control.  Transverse incision in the forearm allowed for outflow control of the distal brachial artery.  The right greater saphenous vein was adequate for bypass use and harvested using a continuous incision from groin to mid thigh.  The graft was prepared on the back table and then reversed and sewn end-to-end with proximally distally.  The interposition was ligated to control hemorrhage.  At completion a palpable radial pulse was noted.  To fill the deficit in the antecubital fossa, a plug of 7 x 10 cm myriad matrix was inserted into the brachial arteriotomy.  A patch was applied over this and sutured to the skin using chromic sutures.  A VAC was applied over the right upper extremity incision.  DESCRIPTION OF PROCEDURE: After identification of the patient in the pre-operative holding area, the patient was transferred to the operating room. The patient was positioned supine on the operating room table. Anesthesia was induced. The right arm and right thigh were prepped and draped in standard fashion. A surgical pause was performed confirming correct patient, procedure, and operative location.  The pneumatic tourniquet had been placed before prepping to control the hemorrhage in the arm.  Intraoperative ultrasound was used to identify the brachial artery proximal and distal to the ruptured pseudoaneurysm.  A longitudinal incision was made in the mid arm and carried down through the subcutaneous tissue until the brachial sheath was encountered.  The brachial sheath was entered sharply.  The brachial artery was skeletonized and encircled proximally distally.  Transverse incision was made in the forearm proximally to expose the distal brachial artery.  The incision was carried out subtenons tissue.  The aponeurosis of the biceps was divided sharply.  The brachial artery was identified and skeletonized.  Proximal distal control was obtained.  The artery was  clamped proximally distally.  The pneumatic tourniquet was released.  Hemostasis was marginal.  Several stitches were placed in the ruptured pseudoaneurysm to control hemorrhage from inside.  Hemostasis was then achieved.  Intraoperative ultrasound was used to map the course of the right greater saphenous vein.  This was harvested from groin to mid thigh using continuous incision.  The incision was carried out the subtenons tissue until the saphenous vein was identified.  Saphenous vein was skeletonized.  Branches, when identified, were ligated and divided.  The proximal and distal aspect of the saphenous vein was then clamped and divided.  The vein was passed off the table and allowed to dwell in a heparinized saline solution.  The proximal and distal stumps were then oversewn with 2-0 silk suture.  The wound was packed.  The vein graft was prepared on the back table.  Several repair stitches were used to create a functional vascular graft.  The vein dilated nicely and appeared excellent quality for a bypass.  A subfascial tunnel was created between the 2 exposures using a Kelly clamp.  The patient was systemically heparinized with 5000 units of IV heparin.  The vascular graft was delivered through the subfascial tunnel in a reverse fashion.  The proximal brachial artery was clamped proximally distally using Gregory clamps.  The artery was transected.  The distal brachial artery was ligated in 2 layers using 4-0 Prolene suture.  An end-to-end anastomosis was performed between the transected brachial artery and the reversed saphenous vein graft.  This was done in continuous technique using 6-0 Prolene.  Clamp was released on the brachial artery and good flow was noted through the anastomosis.  This was tested using an medicine And found to be adequate.  The distal brachial artery was clamped proximally distally using Gregory clamps.  The artery was transected.  The proximal brachial artery was ligated in 2  layers using 4-0 Prolene suture.  An end-to-end anastomosis was performed between the transected brachial artery and the reversed saphenous vein graft.  This was done in continuous technique using 6-0 Prolene.  Clamp was released on the brachial artery and good flow was noted through the anastomosis.    A palpable radial pulse was noted.  Doppler flow confirmed our findings.  Heparin was reversed with protamine.  Hemostasis was confirmed the anastomoses in the surgical beds.  The upper extremity incisions were closed in layers using 3-0 Vicryl and a surgical stapler.  The right greater saphenous vein harvest site was closed with 2 layers of 3-0 Vicryl in a surgical stapler.  A plug of 7 x 10 cm.  Matrix was placed into the brachial arteriotomy which had been excluded.  A sheet of myriad matrix was then overlaid over this wound to help it close more quickly.  This was sutured in place with 3-0 chromic suture.  Final wound dimensions were 4 x 3 x 2 cm.  Adaptic was applied over the upper extremity wounds.  A black sponge was applied over the Adaptic to protect the skin.  This was sealed in the place in typical fashion.  Good seal was achieved before leaving the operating room.  Upon completion of the case instrument and sharps counts were confirmed correct. The patient was transferred to the PACU in good condition. I was  present for all portions of the procedure.  [name]. Stanford Breed, MD [facility] Phone Number: [phone] 02/03/2021 12:12 PM

## 2021-02-03 NOTE — Anesthesia Postprocedure Evaluation (Signed)
Anesthesia Post Note  Patient: [name]  Procedure(s) Performed: EXPLORATION OF RIGHT ARTERIOVENOUS FISTULA  AND RIGHT BRACHIAL TO BRACHIAL BYPASS (Right) VEIN HARVEST OF UPPER RIGHT LEG (Right) APPLICATION OF WOUND VAC (Right: Arm Upper) APPLICATION OF MYRIAD MATRIX (Right: Arm Upper)     Patient location during evaluation: PACU Anesthesia Type: General Level of consciousness: awake and alert Pain management: pain level controlled Vital Signs Assessment: post-procedure vital signs reviewed and stable Respiratory status: spontaneous breathing, nonlabored ventilation, respiratory function stable and patient connected to nasal cannula oxygen Cardiovascular status: blood pressure returned to baseline and stable Postop Assessment: no apparent nausea or vomiting Anesthetic complications: no   No notable events documented.  Last Vitals:  Vitals:   02/03/21 1335 02/03/21 1340  BP: 115/60 115/60  Pulse: (!) 53 (!) 54  Resp: 14 18  Temp:    SpO2: 97% 92%    Last Pain:  Vitals:   02/03/21 1335  TempSrc:   PainSc: [place] Tequita Marrs

## 2021-02-03 NOTE — Progress Notes (Signed)
Pt had minimal to moderate amount of bleeding and staples falling out around rt thigh/groin incision. Attempted to call vascula PA and Dr Stanford Breed with no call back. Removed dressing and replaced with new dry gauze, ABD, medipore tape and coban wrap. Updated 5W RN and transported pt to 5W.

## 2021-02-03 NOTE — Consult Note (Signed)
Hospital Consult   Right arm wound  History of Present Illness: This is a 70 y.o. female history of end-stage renal disease previously underwent fistula ligation and ultimately had myriad acellular matrix placed on the right upper extremity wound.  This has been followed in the office.  She is now admitted with press.  She has no complaints today, wants to go home.  Past Medical History:  Diagnosis Date   Anemia    Anxiety    Atrial flutter ([place]) 02/22/2015   C. difficile colitis 10/10/2011   Chronic diarrhea    Chronic pain    Depression    ESRD on hemodialysis [facility])    Dialysis [name]   GERD (gastroesophageal reflux disease)    HIV (human immunodeficiency virus infection) ([place])    Hypertension    Insomnia    Intracranial hemorrhage (H[facility])    Muscle weakness (generalized)    Pulmonary HTN (HCC)    Tachycardia    TIA (transient ischemic attack)    [facility] do not have this dx   Traumatic hematoma of right upper arm 02/22/2015    Past Surgical History:  Procedure Laterality Date   A/V FISTULAGRAM N/A 04/17/2016   Procedure: A/V Fistulagram - Right Upper;  Surgeon: Waynetta Sandy, MD;  Location: [place] [facility];  Service: Cardiovascular;  Laterality: N/A;   A/V FISTULAGRAM Right 08/28/2019   Procedure: A/V FISTULAGRAM;  Surgeon: Waynetta Sandy, MD;  Location: [place] [facility];  Service: Cardiovascular;  Laterality: Right;   ABDOMINAL HYSTERECTOMY     APPLICATION OF WOUND VAC Right 01/07/2021   Procedure: APPLICATION OF WOUND VAC;  Surgeon: Waynetta Sandy, MD;  Location: [place];  Service: Vascular;  Laterality: Right;   BIOPSY  01/02/2020   Procedure: BIOPSY;  Surgeon: Eloise Harman, DO;  [facility];  Service: Endoscopy;;   BIOPSY  07/11/2020   Procedure: BIOPSY;  Surgeon: Irving Copas., MD;  Location: [place];  Service: Gastroenterology;;   BIOPSY THYROID     CARPAL TUNNEL RELEASE  Right 11/16/2019   Procedure: CARPAL TUNNEL RELEASE;  Surgeon: Leanora Cover, MD;  Location: [place];  Service: Orthopedics;  Laterality: Right;   COLONOSCOPY WITH PROPOFOL N/A 01/02/2020   Surgeon: Eloise Harman, DO;nonbleeding internal hemorrhoids, diverticulosis, status post biopsies of the entire colon (benign). Due for repeat in 2026.   Dialysis Shunts     previous one removed from left arm and now present one in right arm   DIALYSIS/PERMA CATHETER INSERTION Right 04/17/2016   Procedure: dialysis Catheter Insertion central veinous;  Surgeon: Waynetta Sandy, MD;  Location: [place] [facility];  Service: Cardiovascular;  Laterality: Right;   ESOPHAGOGASTRODUODENOSCOPY  12/15/2010   Rourk: erosive reflux esophagitis, MW tear, hiatal hernia (small), gastritis with no h.pylori, possibly nsaid related   ESOPHAGOGASTRODUODENOSCOPY (EGD) WITH PROPOFOL N/A 07/11/2020    Surgeon: Irving Copas., MD; normal esophagus, 4 cm hiatal hernia, gastritis biopsied (mild chronic gastritis, negative H. pylori), single duodenal polyp (Brunner's gland hyperplasia), s/p duodenal biopsy benign, duodenal narrowing at the duodenal sweep.   EUS N/A 07/11/2020    Surgeon: Irving Copas., MD;  dilated CBD and common hepatic duct without stones or sludge in the bile duct, stones and biliary sludge in the gallbladder, pancreatic parenchyma with hyperechoic strands, pancreatic duct dilated with prominent sidebranches in the neck and body and tail, no pancreatic mass though visulalization limited, no ampullary lesion, no malignant appearing lymph node  EXCISION OF BREAST BIOPSY Right 05/04/2012   Procedure: EXCISION OF BREAST BIOPSY;  Surgeon: Donato Heinz, MD;  Location: [facility];  Service: General;  Laterality: Right;  Right Excisional Breast Biopsy   FISTULOGRAM Right 03/26/2014   Procedure: Right Arm Fistulogram with Venoplasty Right Subclavian Vein and Inominate Vein. Debridement Fistula  Ulcer;  Surgeon: Conrad , MD;  Location: [place];  Service: Vascular;  Laterality: Right;   FISTULOGRAM Right 03/29/2017   Procedure: FISTULOGRAM COMPLEX RIGHT ARM with Balloon angioplasty;  Surgeon: Waynetta Sandy, MD;  Location: [place];  Service: Vascular;  Laterality: Right;   HEMOSTASIS CLIP PLACEMENT  07/11/2020   Procedure: HEMOSTASIS CLIP PLACEMENT;  Surgeon: Irving Copas., MD;  Location: [place];  Service: Gastroenterology;;   INCISION AND DRAINAGE Right 12/27/2020   Procedure: INCISION AND DRAINAGE OF RIGHT FOREARM;  Surgeon: Waynetta Sandy, MD;  Location: [place];  Service: Vascular;  Laterality: Right;   INCISION AND DRAINAGE Right 12/31/2020   Procedure: INCISION AND DRAINAGE RIGHT ARM WITH PLACEMNENT OF WOUND VAC;  Surgeon: Waynetta Sandy, MD;  Location: [place];  Service: Vascular;  Laterality: Right;   INCISION AND DRAINAGE Right 01/07/2021   Procedure: RIGHT ARM INCISION AND DRAINAGE;  Surgeon: Waynetta Sandy, MD;  Location: [place];  Service: Vascular;  Laterality: Right;   IR FLUORO GUIDE CV LINE LEFT  12/26/2020   IR FLUORO GUIDE CV LINE LEFT  12/30/2020   IR GENERIC HISTORICAL  05/19/2016   IR REMOVAL TUN CV CATH W/O FL 05/19/2016 Markus Daft, MD [facility]-INTER[facility]   IR US GUIDE VASC ACCESS LEFT  12/26/2020   IR US GUIDE VASC ACCESS LEFT  12/30/2020   LIGATION OF ARTERIOVENOUS  FISTULA Right 11/12/2020   Procedure: LIGATION OF ARTERIOVENOUS  FISTULA WITH EXCISION OF NECROTIC TISSUE;  Surgeon: Waynetta Sandy, MD;  Location: [place];  Service: Vascular;  Laterality: Right;   LIGATION OF ARTERIOVENOUS  FISTULA Right 12/27/2020   Procedure: LIGATION OF ARTERIOVENOUS  FISTULA;  Surgeon: Waynetta Sandy, MD;  Location: [place];  Service: Vascular;  Laterality: Right;   PERIPHERAL VASCULAR BALLOON ANGIOPLASTY Right 04/17/2016   Procedure: Peripheral Vascular Balloon Angioplasty;  Surgeon: Waynetta Sandy, MD;   Location: [place] [facility];  Service: Cardiovascular;  Laterality: Right;  Central segment AV Fistula   PERIPHERAL VASCULAR BALLOON ANGIOPLASTY Right 03/14/2018   Procedure: PERIPHERAL VASCULAR BALLOON ANGIOPLASTY;  Surgeon: Waynetta Sandy, MD;  Location: [place] [facility];  Service: Cardiovascular;  Laterality: Right;  subclavian vein   PERIPHERAL VASCULAR BALLOON ANGIOPLASTY Right 08/28/2019   Procedure: PERIPHERAL VASCULAR BALLOON ANGIOPLASTY;  Surgeon: Waynetta Sandy, MD;  Location: [place] [facility];  Service: Cardiovascular;  Laterality: Right;  upper arm   PERIPHERAL VASCULAR BALLOON ANGIOPLASTY Right 11/11/2020   Procedure: PERIPHERAL VASCULAR BALLOON ANGIOPLASTY;  Surgeon: Waynetta Sandy, MD;  Location: [place] [facility];  Service: Cardiovascular;  Laterality: Right;   PERIPHERAL VASCULAR INTERVENTION Right 03/14/2018   Procedure: PERIPHERAL VASCULAR INTERVENTION;  Surgeon: Waynetta Sandy, MD;  Location: [place] [facility];  Service: Cardiovascular;  Laterality: Right;  subclavian vein   POLYPECTOMY  07/11/2020   Procedure: POLYPECTOMY;  Surgeon: Mansouraty, Telford Nab., MD;  Location: [facility];  Service: Gastroenterology;;   REVISON OF ARTERIOVENOUS FISTULA Right 11/12/2020   Procedure: CREATION OF RIGHT ARM ARTERIOVENOUS FISTULA;  Surgeon: Waynetta Sandy, MD;  Location: [place];  Service: Vascular;  Laterality: Right;   SHUNTOGRAM Right 10/19/2013  Procedure: FISTULOGRAM;  Surgeon: Conrad Ovid, MD;  Location: [facility];  Service: Cardiovascular;  Laterality: Right;   SKIN SPLIT GRAFT Right 01/07/2021   Procedure: MYRIAD SKIN GRAFT PLACEMENT;  Surgeon: Waynetta Sandy, MD;  Location: [place];  Service: Vascular;  Laterality: Right;   TONSILLECTOMY     VISCERAL VENOGRAPHY Right 03/14/2018   Procedure: CENTRAL VENOGRAPHY;  Surgeon: Waynetta Sandy, MD;  Location: [place] [facility];  Service: Cardiovascular;   Laterality: Right;  upper ext fistula    Allergies  Allergen Reactions   Lactose Intolerance (Gi) Other (See Comments)    On MAR   Latex Rash and Itching   Penicillins Rash and Swelling    Has patient had a PCN reaction causing immediate rash, facial/tongue/throat swelling, SOB or lightheadedness with hypotension: No Has patient had a PCN reaction causing severe rash involving mucus membranes or skin necrosis: No Has patient had a PCN reaction that required hospitalization No Has patient had a PCN reaction occurring within the last 10 years: No If all of the above answers are "NO", then may proceed with Cephalosporin use.      Prior to Admission medications   Medication Sig Start Date End Date Taking? Authorizing Provider  Amino Acids-Protein Hydrolys (PRO-STAT 64 PO) Take by mouth. Give 21m by mouth in the morning for wound healing   Yes [provider]  apixaban (ELIQUIS) 2.5 MG TABS tablet Take 1 tablet (2.5 mg total) by mouth 2 (two) times daily. 11/27/20  Yes CUlyses Amor PA-C  baclofen (LIORESAL) 10 MG tablet Take 10 mg by mouth daily as needed for muscle spasms.   Yes [provider]  clopidogrel (PLAVIX) 75 MG tablet Take 1 tablet (75 mg total) by mouth daily with breakfast. 07/14/20  Yes Mansouraty, GTelford Nab, MD  collagenase (SANTYL) ointment Apply 1 application topically daily. Apply to sacrum topically every day shift for wound healing   Yes [provider]  cyanocobalamin (,VITAMIN B-12,) 1000 MCG/ML injection Inject 1,000 mcg into the muscle every 30 (thirty) days. On the 28th of every month   Yes [provider]  dolutegravir (TIVICAY) 50 MG tablet Take 50 mg by mouth daily at 4 PM.   Yes [provider]  Emollient (MINERIN) LOTN Apply 1 application topically every 12 (twelve) hours as needed (dry skin). Apply to feet and ankles   Yes [provider]  fentaNYL (DURAGESIC) 25 MCG/HR Place 1 patch onto the skin every  3 (three) days. 01/09/21  Yes LOswald Hillock MD  lamivudine (EPIVIR) 100 MG tablet Take 50 mg by mouth daily. (0800)   Yes [provider]  Lidocaine HCl 4 % CREA Apply 1 application topically every 12 (twelve) hours as needed (pain).   Yes [provider]  lidocaine-prilocaine (EMLA) cream Apply 1 application topically See admin instructions. (0900) Apply to fistula site one hour before dialysis on Tuesday, Thursday, and Saturday.   Yes [provider]  megestrol (MEGACE) 400 MG/10ML suspension Take 400 mg by mouth daily.   Yes [provider]  melatonin 3 MG TABS tablet Take 3 mg by mouth at bedtime. (2000)   Yes [provider]  metoprolol tartrate (LOPRESSOR) 25 MG tablet Take 1 tablet (25 mg total) by mouth 2 (two) times daily. 11/27/20  Yes CUlyses Amor PA-C  mirtazapine (REMERON) 15 MG tablet Take 15 mg by mouth at bedtime. 12/05/20  Yes [provider]  nitroGLYCERIN (NITROSTAT) 0.4 MG SL tablet  Place 0.4 mg under the tongue every 5 (five) minutes x 3 doses as needed for chest pain.    Yes [provider]  ondansetron (ZOFRAN ODT) 8 MG disintegrating tablet Take 1 tablet (8 mg total) by mouth every 8 (eight) hours as needed for nausea or vomiting. 12/16/20  Yes Erenest Rasher, PA-C  Oxycodone HCl 10 MG TABS Take 1 tablet (10 mg total) by mouth every 12 (twelve) hours as needed (severe pain). 01/09/21  Yes Oswald Hillock, MD  pantoprazole (PROTONIX) 40 MG tablet Take 1 tablet (40 mg total) by mouth daily before breakfast. 12/16/20  Yes Erenest Rasher, PA-C  promethazine (PHENERGAN) 12.5 MG tablet Take 12.5 mg by mouth See admin instructions. Take 1 tablet (12.5 mg) by mouth in the morning every Tuesday, Thursday and Saturday for nausea/vomiting and every 6 hours as needed for n/v   Yes [provider]  senna (SENOKOT) 8.6 MG tablet Take 2 tablets by mouth every evening. (2100)   Yes [provider]   vancomycin (VANCOCIN) 500-5 MG/100ML-% IVPB Inject 100 mLs (500 mg total) into the vein Every Tuesday,Thursday,and Saturday with dialysis. 01/11/21 02/12/21 Yes Oswald Hillock, MD  venlafaxine XR (EFFEXOR-XR) 150 MG 24 hr capsule Take 150 mg by mouth daily with breakfast. (0900)   Yes [provider]  vitamin C (ASCORBIC ACID) 500 MG tablet Take 500 mg by mouth 2 (two) times daily.   Yes [provider]  zidovudine (RETROVIR) 100 MG capsule Take 100 mg by mouth 3 (three) times daily. (0900, 1300, & 2100)   Yes [provider]  Zinc Sulfate (ZINC-220 PO) Take 1 tablet by mouth daily.   Yes [provider]  amLODipine (NORVASC) 10 MG tablet Take 1 tablet (10 mg total) by mouth daily. 02/03/21   Bonnielee Haff, MD  calcitRIOL (ROCALTROL) 0.25 MCG capsule Take 5 capsules (1.25 mcg total) by mouth Every Tuesday,Thursday,and Saturday with dialysis. 02/04/21   Bonnielee Haff, MD  calcium acetate, Phos Binder, (PHOSLYRA) 667 MG/5ML SOLN Take 10 mLs (1,334 mg total) by mouth 3 (three) times daily with meals. 02/03/21   Bonnielee Haff, MD  cinacalcet (SENSIPAR) 30 MG tablet Take 3 tablets (90 mg total) by mouth Every Tuesday,Thursday,and Saturday with dialysis. 02/04/21   Bonnielee Haff, MD  fluconazole (DIFLUCAN) 100 MG tablet Take 1 tablet (100 mg total) by mouth daily for 7 days. For 7 more days 02/03/21 02/10/21  Bonnielee Haff, MD  hydrALAZINE (APRESOLINE) 100 MG tablet Take 1 tablet (100 mg total) by mouth every 8 (eight) hours. 02/03/21   Bonnielee Haff, MD  lacosamide 150 MG TABS Take 1 tablet (150 mg total) by mouth 2 (two) times daily. 02/03/21   Bonnielee Haff, MD  levETIRAcetam (KEPPRA) 1000 MG tablet Take 1 tablet (1,000 mg total) by mouth daily. 02/03/21   Bonnielee Haff, MD  levETIRAcetam (KEPPRA) 250 MG tablet Take 1 tablet (250 mg total) by mouth every Tuesday, Thursday, and Saturday at 6 PM. 02/04/21   Bonnielee Haff, MD  Multiple Vitamin (MULTIVITAMIN WITH  MINERALS) TABS tablet Take 1 tablet by mouth at bedtime. (2000)    [provider]  nystatin (MYCOSTATIN) 100000 UNIT/ML suspension Take 5 mLs (500,000 Units total) by mouth 4 (four) times daily for 7 days. 02/03/21 02/10/21  Bonnielee Haff, MD  polyethylene glycol (MIRALAX / GLYCOLAX) 17 g packet Take 17 g by mouth daily as needed for moderate constipation. 02/03/21   Bonnielee Haff, MD  thiamine 100 MG tablet Take 1  tablet (100 mg total) by mouth daily. 02/03/21   Bonnielee Haff, MD  venlafaxine (EFFEXOR) 75 MG tablet Take 1 tablet (75 mg total) by mouth 2 (two) times daily. 02/03/21   Bonnielee Haff, MD    Social History   Socioeconomic History   Marital status: Widowed    Spouse name: Not on file   Number of children: 1   Years of education: Not on file   Highest education level: Not on file  Occupational History   Not on file  Tobacco Use   Smoking status: Former    Types: Cigarettes    Quit date: 03/12/1983    Years since quitting: 37.9   Smokeless tobacco: Never  Vaping Use   Vaping Use: Never used  Substance and Sexual Activity   Alcohol use: No    Alcohol/week: 0.0 standard drinks   Drug use: No   Sexual activity: Not Currently    Birth control/protection: Surgical    Comment: Hysterectomy  Other Topics Concern   Not on file  Social History Narrative   Not on file   Social Determinants of Health   Financial Resource Strain: Not on file  Food Insecurity: Not on file  Transportation Needs: Not on file  Physical Activity: Not on file  Stress: Not on file  Social Connections: Not on file  Intimate Partner Violence: Not on file    Family History  Problem Relation Age of Onset   Anesthesia problems Neg Hx    Hypotension Neg Hx    Malignant hyperthermia Neg Hx    Pseudochol deficiency Neg Hx    Colon cancer Neg Hx        Unsure of parents who both died when she was a baby    ROS: No complaints  Physical Examination  Vitals:   02/03/21 0532  02/03/21 0752  BP: (!) 144/88 (!) 152/94  Pulse: 68 70  Resp: 12 14  Temp: 98.1 F (36.7 C) 98.3 F (36.8 C)  SpO2: 100%    Body mass index is 15.63 kg/m.  Awake and alert Nonlabored respirations  I evaluated the right upper extremity wound.  There is good granulation tissue in the distal wound in the proximal wound and there was good granulation tissue but evident suture.  As I was mobilizing my camera to take a picture the fistula blew out without any manipulation and pressure was held.  Ultimately a blood pressure cuff was inflated in the upper arm as a tourniquet and the bleeding area was wrapped tightly with Ace wrap and the OR was notified with plans for transfer urgently to the operating room.  CBC    Component Value Date/Time   WBC 4.1 02/01/2021 0608   RBC 2.88 (L) 02/01/2021 0608   HGB 8.9 (L) 02/01/2021 0608   HCT 29.5 (L) 02/01/2021 0608   PLT 126 (L) 02/01/2021 0608   MCV 102.4 (H) 02/01/2021 0608   MCH 30.9 02/01/2021 0608   MCHC 30.2 02/01/2021 0608   RDW 18.9 (H) 02/01/2021 0608   LYMPHSABS 0.7 01/24/2021 1331   MONOABS 0.2 01/24/2021 1331   EOSABS 0.0 01/24/2021 1331   BASOSABS 0.0 01/24/2021 1331    BMET    Component Value Date/Time   NA 134 (L) 02/01/2021 0608   K 3.8 02/01/2021 0608   CL 101 02/01/2021 0608   CO2 24 02/01/2021 0608   GLUCOSE 94 02/01/2021 0608   BUN 15 02/01/2021 0608   CREATININE 3.88 (H) 02/01/2021 0608   CREATININE 5.66 (  H) 12/22/2019 1117   CALCIUM 9.1 02/01/2021 0608   CALCIUM 9.0 03/12/2011 0527   GFRNONAA 12 (L) 02/01/2021 0608   GFRNONAA 5 (L) 02/12/2015 1000   GFRAA 9 (L) 10/22/2019 0105   GFRAA 5 (L) 02/12/2015 1000    COAGS: Lab Results  Component Value Date   INR 1.2 01/24/2021   INR 1.14 12/28/2011   INR 1.10 12/13/2010     Non-Invasive Vascular Imaging:   No imaging   ASSESSMENT/PLAN: This is a 70 y.o. female with end-stage renal disease with healing right upper extremity wound.  Unfortunately the  fistula blew out on evaluation today.  We will plan for urgent evaluation in the operating room with either stenting or bypass or a combination of the 2.  I discussed the case extensively with Dr. Stanford Breed and we will plan to go emergently to the operating room.  Bassheva Flury C. Donzetta Matters, MD [facility] Office: [phone] Pager: [phone]

## 2021-02-03 NOTE — Progress Notes (Signed)
[facility] NEPHROLOGY PROGRESS NOTE  Assessment/ Plan:  Outpatient HD orders:[name] TTS,EDW 41.5 kg,Bath 2K/2.5 ca,BF 350  DF 500,Time - 180 minutes Access catheter  Medications - out of hectorol and now on calcitriol - 1.25 mcg each tx. Sensipar 90 mg three times a week; Epogen 3600 units each tx, Heparin 1000 units bolus then 500 units per hour Has been on vanc 500 mg each tx (though misses often)   # Acute encephalopathy: EEG with moderate diffuse encephalopathy of nonspecific etiology.  Based on neurology evaluation appears to be consistent with PRES along with imaging showing no evidence of acute CVA.  Seen by neurology.  Mental status seems seems to be stable.  # ESRD: She is on outpatient TTS schedule. Plan for next HD on 12/6.  Left IJ TDC for access.  # Anemia:  continue ESA with HD.  # CKD-MBD: Resumed  calcitriol and Sensipar for PTH control.  Started calcium acetate for hyperphosphatemia and the repeat lab looks better.  Continue to monitor.  #  Right radiocephalic fistula infection: Status post ligation and debridement on 10/28 with inconsistent outpatient antibiotic therapy due to missing hemodialysis or signing off early.  Restarted back on antibiotic therapy with vancomycin.  #Rupture pseudoaneurysm -taken to OR urgently on 12/5 for re-do and rt brachial brachial bypass. Appreciate VVS assistance  # Hypertension: Blood pressures elevated.  She is currently on amlodipine, hydralazine and metoprolol dose was increased yesterday.  Volume status acceptable.  Continue to monitor blood pressure, UF as tolerated with HD.    #Hypokalemia complicated by diarrhea.  Increased potassium bath during dialysis, potassium 4.2 today.  Subjective: Seen and examined in pacu. Taken to OR emergently for ruptured pseudoaneurysm this AM. Currently sleepy, coming off sedation. Objective Vital signs in last 24 hours: Vitals:   02/02/21 2346 02/03/21 0500 02/03/21 0532  02/03/21 0752  BP: (!) 150/82  (!) 144/88 (!) 152/94  Pulse: 66  68 70  Resp: 19  12 14   Temp: 98.1 F (36.7 C)  98.1 F (36.7 C) 98.3 F (36.8 C)  TempSrc: Oral  Oral Oral  SpO2: 100%  100% 99%  Weight:  39.4 kg     Weight change: -2.4 kg  Intake/Output Summary (Last 24 hours) at 02/03/2021 1247 Last data filed at 02/03/2021 1228 Gross per 24 hour  Intake 700 ml  Output 150 ml  Net 550 ml       Labs: Basic Metabolic Panel: Recent Labs  Lab 01/29/21 1621 01/30/21 0206 01/30/21 1749 01/31/21 0246 01/31/21 2030 02/01/21 0608 02/03/21 0952  NA  --   --  128* 133*  --  134* 135  K  --   --  4.2 3.2* 3.4* 3.8 4.2  CL  --   --  93* 100  --  101 101  CO2  --   --  24 26  --  24  --   GLUCOSE  --   --  92 102*  --  94 97  BUN  --   --  31* 5*  --  15 18  CREATININE  --   --  5.41* 2.02*  --  3.88* 4.70*  CALCIUM  --   --  7.7* 8.5*  --  9.1  --   PHOS 3.7 3.7 3.2  --   --   --   --    Liver Function Tests: Recent Labs  Lab 01/30/21 1749 01/31/21 0246 02/01/21 0608  AST  --  25  38  ALT  --  15 26  ALKPHOS  --  73 67  BILITOT  --  0.3 0.2*  PROT  --  7.1 7.0  ALBUMIN 2.5* 2.7* 2.7*   No results for input(s): LIPASE, AMYLASE in the last 168 hours. No results for input(s): AMMONIA in the last 168 hours.  CBC: Recent Labs  Lab 01/28/21 1650 01/29/21 0203 01/30/21 0206 01/31/21 0246 02/01/21 0608 02/03/21 0952  WBC 4.1 5.3 5.2 4.3 4.1  --   HGB 9.1* 9.7* 9.4* 9.1* 8.9* 11.2*  HCT 29.5* 31.1* 30.8* 30.0* 29.5* 33.0*  MCV 97.7 96.3 98.1 98.7 102.4*  --   PLT 126* 122* 117* 114* 126*  --    Cardiac Enzymes: No results for input(s): CKTOTAL, CKMB, CKMBINDEX, TROPONINI in the last 168 hours. CBG: Recent Labs  Lab 02/02/21 0806 02/02/21 1157 02/02/21 1555 02/02/21 2126 02/03/21 0828  GLUCAP 76 87 97 108* 77    Iron Studies: No results for input(s): IRON, TIBC, TRANSFERRIN, FERRITIN in the last 72 hours. Studies/Results: No results  found.  Medications: Infusions:    Scheduled Medications:  [MAR Hold] sodium chloride   Intravenous Once   [MAR Hold] sodium chloride   Intravenous Once   [MAR Hold] amLODipine  10 mg Oral Daily   [MAR Hold] apixaban  2.5 mg Oral BID   [MAR Hold] calcitRIOL  1.25 mcg Oral Q T,Th,Sa-HD   [MAR Hold] calcium acetate (Phos Binder)  1,334 mg Oral TID WC   [MAR Hold] chlorhexidine  15 mL Mouth Rinse BID   [MAR Hold] Chlorhexidine Gluconate Cloth  6 each Topical Q0600   [MAR Hold] cinacalcet  90 mg Oral Q T,Th,Sa-HD   [MAR Hold] clopidogrel  75 mg Oral Q breakfast   [MAR Hold] collagenase  1 application Topical Daily   [MAR Hold] darbepoetin (ARANESP) injection - DIALYSIS  40 mcg Intravenous Q Tue-HD   [MAR Hold] dolutegravir  50 mg Oral q1600   [MAR Hold] fluconazole  100 mg Oral Daily   [MAR Hold] hydrALAZINE  100 mg Oral Q8H   [MAR Hold] lacosamide  150 mg Oral BID   [MAR Hold] lamiVUDine  50 mg Oral Daily   [MAR Hold] levETIRAcetam  1,000 mg Oral Daily   [MAR Hold] levETIRAcetam  250 mg Oral Q T,Th,Sat-1800   [MAR Hold] lidocaine-prilocaine  1 application Topical Q T,Th,Sa-HD   [MAR Hold] mouth rinse  15 mL Mouth Rinse q12n4p   [MAR Hold] metoprolol tartrate  25 mg Oral BID   [MAR Hold] mirtazapine  15 mg Oral QHS   morphine       [MAR Hold] nystatin  5 mL Oral QID   [MAR Hold] pantoprazole  40 mg Oral QHS   [MAR Hold] senna  2 tablet Oral QPM   [MAR Hold] thiamine  100 mg Oral Daily   [MAR Hold] vancomycin  500 mg Intravenous Q T,Th,Sa-HD   [MAR Hold] venlafaxine  75 mg Oral BID   [MAR Hold] zidovudine  100 mg Oral Q8H    have reviewed scheduled and prn medications.  Physical Exam: General: laying flat in stretcher, drowsy Heart:RRR, s1s2 nl Lungs: Clear b/l, no crackle Abdomen:soft, Non-tender, non-distended Extremities:No peripheral edema, rue wound vac in place Dialysis Access: Left IJ TDC in place.  Athziry Millican 02/03/2021,12:47 PM  LOS: 11 days

## 2021-02-03 NOTE — Progress Notes (Signed)
PT Cancellation Note  Patient Details Name: [name] MRN: [id number] DOB: May 13, 1950   Cancelled Treatment:    Reason Eval/Treat Not Completed: Medical issues which prohibited therapy  Noted events and pt currently emergently in OR.    Arby Barrette, PT Acute Rehabilitation Services  Pager [phone] Office [phone]  Rexanne Mano 02/03/2021, 12:23 PM

## 2021-02-03 NOTE — TOC Progression Note (Signed)
Transition of Care [facility]) - Progression Note    Patient Details  Name: [name] MRN: [id number] Date of Birth: [birth year]  Transition of Care [employer]) CM/SW [place], LCSW Phone Number: 02/03/2021, 8:34 AM  Clinical Narrative:    CSW spoke with Melissa at [facility] and confirmed they can accept patient back when medically ready pending a rapid COVID test.      Barriers to Discharge: Continued Medical Work up  Expected Discharge Plan and Services       Post Acute Care Choice: [place] arrangements for the past 2 months: [place]                                       Social Determinants of Health (SDOH) Interventions    Readmission Risk Interventions Readmission Risk Prevention Plan 10/22/2019  Transportation Screening Complete  PCP or Specialist Appt within 3-5 Days Not Complete  Not Complete comments SNF MD to follow  [place] or [place] Not Complete  HRI or Home Care Consult comments Pt resides in a SNF  Social Work Consult for [place] Planning/Counseling Complete  Palliative Care Screening Not Applicable  Medication Review Press photographer) Complete  Some recent data might be hidden

## 2021-02-03 NOTE — Progress Notes (Signed)
Patient's daughter [name] called by RN to make her aware that her mother would be transferring to a different unit this evening once a bed became available, no answer-voicemail left.

## 2021-02-04 ENCOUNTER — Encounter (HOSPITAL_COMMUNITY): Payer: Self-pay | Admitting: Vascular Surgery

## 2021-02-04 DIAGNOSIS — D62 Acute posthemorrhagic anemia: Secondary | ICD-10-CM

## 2021-02-04 DIAGNOSIS — Z9889 Other specified postprocedural states: Secondary | ICD-10-CM

## 2021-02-04 DIAGNOSIS — I4811 Longstanding persistent atrial fibrillation: Secondary | ICD-10-CM | POA: Diagnosis not present

## 2021-02-04 LAB — RENAL FUNCTION PANEL
Albumin: 2.4 g/dL — ABNORMAL LOW (ref 3.5–5.0)
Anion gap: 10 (ref 5–15)
BUN: 28 mg/dL — ABNORMAL HIGH (ref 8–23)
CO2: 22 mmol/L (ref 22–32)
Calcium: 8.5 mg/dL — ABNORMAL LOW (ref 8.9–10.3)
Chloride: 100 mmol/L (ref 98–111)
Creatinine, Ser: 5.93 mg/dL — ABNORMAL HIGH (ref 0.44–1.00)
GFR, Estimated: 7 mL/min — ABNORMAL LOW (ref >60)
Glucose, Bld: 79 mg/dL (ref 70–99)
Phosphorus: 4.3 mg/dL (ref 2.5–4.6)
Potassium: 5.9 mmol/L — ABNORMAL HIGH (ref 3.5–5.1)
Sodium: 132 mmol/L — ABNORMAL LOW (ref 135–145)

## 2021-02-04 LAB — CBC
HCT: 20.6 % — ABNORMAL LOW (ref 36.0–46.0)
HCT: 27.7 % — ABNORMAL LOW (ref 36.0–46.0)
Hemoglobin: 6.4 g/dL — CL (ref 12.0–15.0)
Hemoglobin: 9.1 g/dL — ABNORMAL LOW (ref 12.0–15.0)
MCH: 31.5 pg (ref 26.0–34.0)
MCH: 29.8 pg (ref 26.0–34.0)
MCHC: 31.1 g/dL (ref 30.0–36.0)
MCHC: 32.9 g/dL (ref 30.0–36.0)
MCV: 101.5 fL — ABNORMAL HIGH (ref 80.0–100.0)
MCV: 90.8 fL (ref 80.0–100.0)
Platelets: 143 10*3/uL — ABNORMAL LOW (ref 150–400)
Platelets: 130 10*3/uL — ABNORMAL LOW (ref 150–400)
RBC: 2.03 MIL/uL — ABNORMAL LOW (ref 3.87–5.11)
RBC: 3.05 MIL/uL — ABNORMAL LOW (ref 3.87–5.11)
RDW: 19.5 % — ABNORMAL HIGH (ref 11.5–15.5)
RDW: 21.5 % — ABNORMAL HIGH (ref 11.5–15.5)
WBC: 6.8 10*3/uL (ref 4.0–10.5)
WBC: 6.2 10*3/uL (ref 4.0–10.5)
nRBC: 0 % (ref 0.0–0.2)
nRBC: 0 % (ref 0.0–0.2)

## 2021-02-04 LAB — VANCOMYCIN, RANDOM: Vancomycin Rm: 16

## 2021-02-04 LAB — GLUCOSE, CAPILLARY
Glucose-Capillary: 147 mg/dL — ABNORMAL HIGH (ref 70–99)
Glucose-Capillary: 68 mg/dL — ABNORMAL LOW (ref 70–99)
Glucose-Capillary: 68 mg/dL — ABNORMAL LOW (ref 70–99)
Glucose-Capillary: 73 mg/dL (ref 70–99)
Glucose-Capillary: 76 mg/dL (ref 70–99)
Glucose-Capillary: 79 mg/dL (ref 70–99)
Glucose-Capillary: 87 mg/dL (ref 70–99)
Glucose-Capillary: 88 mg/dL (ref 70–99)

## 2021-02-04 LAB — PREPARE RBC (CROSSMATCH)

## 2021-02-04 MED ORDER — SODIUM CHLORIDE 0.9% IV SOLUTION: Freq: Once | INTRAVENOUS | Status: AC

## 2021-02-04 MED ORDER — DIPHENHYDRAMINE HCL 25 MG PO CAPS
ORAL_CAPSULE | ORAL | Status: AC
  Filled 2021-02-04: qty 1

## 2021-02-04 MED ORDER — DEXTROSE 50 % IV SOLN
INTRAVENOUS | Status: AC
  Administered 2021-02-04: 50 mL
  Filled 2021-02-04: qty 50

## 2021-02-04 MED ORDER — ACETAMINOPHEN 325 MG PO TABS
650.0000 mg | ORAL_TABLET | Freq: Once | ORAL | Status: AC
  Administered 2021-02-04: 650 mg via ORAL

## 2021-02-04 MED ORDER — DIPHENHYDRAMINE HCL 25 MG PO CAPS
25.0000 mg | ORAL_CAPSULE | Freq: Once | ORAL | Status: AC
  Administered 2021-02-04: 25 mg via ORAL

## 2021-02-04 MED ORDER — DEXTROSE 50 % IV SOLN
INTRAVENOUS | Status: AC
  Administered 2021-02-04: 25 mL
  Filled 2021-02-04: qty 50

## 2021-02-04 MED ORDER — DEXTROSE 50 % IV SOLN: 25.0000 mL | Freq: Once | INTRAVENOUS | Status: AC

## 2021-02-04 MED ORDER — HYDRALAZINE HCL 20 MG/ML IJ SOLN: 10.0000 mg | Freq: Four times a day (QID) | INTRAMUSCULAR | Status: DC | PRN

## 2021-02-04 MED ORDER — DEXTROSE 50 % IV SOLN
INTRAVENOUS | Status: AC
  Filled 2021-02-04: qty 50

## 2021-02-04 MED ORDER — ACETAMINOPHEN 325 MG PO TABS
650.0000 mg | ORAL_TABLET | Freq: Once | ORAL | Status: AC
  Administered 2021-02-04: 650 mg via ORAL
  Filled 2021-02-04: qty 2

## 2021-02-04 MED ORDER — HEPARIN SODIUM (PORCINE) 1000 UNIT/ML IJ SOLN
INTRAMUSCULAR | Status: AC
  Filled 2021-02-04: qty 1

## 2021-02-04 MED ORDER — DIPHENHYDRAMINE HCL 25 MG PO CAPS
25.0000 mg | ORAL_CAPSULE | Freq: Once | ORAL | Status: AC
  Administered 2021-02-04: 25 mg via ORAL
  Filled 2021-02-04: qty 1

## 2021-02-04 NOTE — TOC Initial Note (Signed)
Transition of Care [facility]) - Initial/Assessment Note    Patient Details  Name: [name] MRN: [id number] Date of Birth: 1950/12/23  Transition of Care [facility] CM/SW Contact:    Milas Gain, [place] Phone Number: 02/04/2021, 4:49 PM  Clinical Narrative:                  Patient currently sleepy and fatigued unable to complete assessment. CSW will follow up tomorrow. CSW called Melissa with [facility] who confirmed patient is long term there. Melissa confirmed she can accept patient back when medically ready for dc. All that will be needed for patient to return is FL2. Will complete FL2 close to patient being medically ready.CSW will follow up with patient tomorrow.CSW will continue to follow and assist with patients dc planning needs.   Barriers to Discharge: Continued Medical Work up   Patient Goals and CMS Choice Patient states their goals for this hospitalization and ongoing recovery are:: Patient is LTC at [place]      Expected Discharge Plan and [place] Choice: [place] arrangements for the past 2 months: [place]                                      Prior Living Arrangements/Services Living arrangements for the past 2 months: [place] Lives with:: Facility Resident   Do you feel safe going back to the place where you live?: Yes               Activities of Daily Living      Permission Sought/Granted                  Emotional Assessment              Admission diagnosis:  Acute delirium [R41.0] Syncope [R55] Acute encephalopathy [G93.40] Syncope, unspecified syncope type [R55] Patient Active Problem List   Diagnosis Date Noted   Thrombocytopenia ([place]) 01/30/2021   Hyponatremia 01/30/2021   Acute delirium    Hyperthyroidism 01/25/2021   Seizure ([place]) 01/25/2021   MRSA infection 01/25/2021   PRES (posterior reversible encephalopathy syndrome)     Hypertensive emergency    Hypoglycemia 12/25/2020   Infected fluid collection with fistula 12/24/2020   Dysphagia 12/16/2020   Loss of weight 12/16/2020   Abdominal pain, epigastric 12/16/2020   Protein-calorie malnutrition, severe 11/20/2020   Chest pain of uncertain etiology    Altered mental status    ESRD (end stage renal disease) ([place]) 11/11/2020   Pancreatic abnormality 08/05/2020   Gastritis without bleeding 08/05/2020   Dilated pancreatic duct 05/15/2020   Abnormal MRI of abdomen 05/15/2020   Common bile duct dilation    Lobar pneumonia ([place]) 07/20/2019   Acute respiratory failure with hypoxia ([place]) 07/20/2019   Colitis 07/19/2019   Dilation of common bile duct 07/19/2019   Ventral hernia without obstruction or gangrene    Hypocalcemia 03/07/2019   Other pancytopenia ([place]) 12/08/2018   Macrocytic anemia 04/17/2016   Hepatitis B immune 01/28/2016   Atrial flutter ([place]) 02/22/2015   Hyperkalemia 02/20/2015   Venous stenosis of right upper extremity 04/06/2014   ESRD on dialysis ([place]) 04/06/2014   Essential hypertension    Nausea and vomiting 12/15/2012   CHF, chronic ([place]) 11/25/2012   Coronary artery disease 09/15/2012   History of stroke 09/15/2012   Anxiety 09/15/2012  Atrial fibrillation ([place]) 05/02/2012   Goiter 04/08/2012   Intracranial bleed ([place]) 12/28/2011   Angioedema of lips 09/21/2011   Cellulitis of breast 03/09/2011   Erosive esophagitis 12/15/2010   Mallory - Weiss tear 12/15/2010   UGI bleed 12/13/2010   DIARRHEA 04/12/2009   Human immunodeficiency virus (HIV) disease ([place]) 11/15/2008   PANCREATITIS, HX OF 11/15/2008   TOBACCO USE, QUIT 11/15/2008   PAIN IN JOINT, UPPER ARM 08/07/2008   ABDOMINAL WALL HERNIA 04/26/2007   BRONCHITIS, ACUTE 11/16/2006   HYPOKALEMIA, HX OF 07/13/2006   ANEMIA, NORMOCYTIC, CHRONIC 03/23/2006   DEPRESSION 03/23/2006   Gastroesophageal reflux disease 03/23/2006   PANCREATITIS 03/23/2006   MASS, RIGHT AXILLA  03/23/2006   HEADACHE 03/23/2006   SYMPTOM, ENLARGEMENT, LYMPH NODES 03/23/2006   SHINGLES, HX OF 03/23/2006   HYSTERECTOMY, TOTAL, HX OF 03/23/2006   PCP:  Hilbert Corrigan, MD Pharmacy:   [place], [place] - [address] [place] [address] Phone: [phone] Fax: [phone]     Social Determinants of Health (SDOH) Interventions    Readmission Risk Interventions Readmission Risk Prevention Plan 10/22/2019  Transportation Screening Complete  PCP or Specialist Appt within 3-5 Days Not Complete  Not Complete comments SNF MD to follow  [place] or [place] Not Complete  HRI or Home Care Consult comments Pt resides in a SNF  Social Work Consult for [place] Planning/Counseling Complete  Palliative Care Screening Not Applicable  Medication Review Press photographer) Complete  Some recent data might be hidden

## 2021-02-04 NOTE — Progress Notes (Signed)
Patient back from dialysis to 4E. VSS. Patient resting comfortably. Will continue to monitor.  Daymon Larsen, RN

## 2021-02-04 NOTE — Progress Notes (Signed)
Treatment terminated at patient request.

## 2021-02-04 NOTE — Progress Notes (Signed)
SLP Cancellation Note  Patient Details Name: [name] MRN: [id number] DOB: Jul 07, 1950   Cancelled treatment:       Reason Eval/Treat Not Completed: Patient at procedure or test/unavailable. In HD. Will f/u   Rodolphe Edmonston, Katherene Ponto 02/04/2021, 8:09 AM

## 2021-02-04 NOTE — Progress Notes (Signed)
PT Cancellation Note  Patient Details Name: [name] MRN: [id number] DOB: 1950-07-17   Cancelled Treatment:    Reason Eval/Treat Not Completed: Patient at procedure or test/unavailable (HD). Will follow-up for PT treatment as schedule permits.  Mabeline Caras, PT, DPT Acute Rehabilitation Services  Pager [phone] Office [place] 02/04/2021, 8:08 AM

## 2021-02-04 NOTE — Progress Notes (Signed)
Patient CBG 68. Patient fatigued, refusing to eat or drink. D50 half amp given per order. Will continue to monitor.  Daymon Larsen, RN

## 2021-02-04 NOTE — Progress Notes (Signed)
Physical Therapy Treatment Patient Details Name: [name] MRN: [id number] DOB: 16-Oct-1950 Today's Date: 02/04/2021   History of Present Illness 70 yo female admitted 01/23/21 after syncope at HD. 11/25 pt with HTN and seizure with LTM EEG. Pt with PRES. 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 PMhx: HTN, Aflutter, pulmonary HTN, HIV, GERD, chronic pain, ESRD on HD TTS.    PT Comments    Pt very fatigued today after HD, so session focused on EOB therapeutic exercise. Pt limited by decreased strength, impaired cognition, and decreased functional mobility. Pt requiring mod A to go from supine to sit and max cues for upright posture at EOB. Goals downgraded to reflect current status. Continue to recommend return to SNF. Will continue to follow acutely to address short-term PT goals.     Recommendations for follow up therapy are one component of a multi-disciplinary discharge planning process, led by the attending physician.  Recommendations may be updated based on patient status, additional functional criteria and insurance authorization.  Follow Up Recommendations  Skilled nursing-short term rehab (<3 hours/day)     Assistance Recommended at Discharge Frequent or constant Supervision/Assistance  Equipment Recommendations  Rolling walker (2 wheels);BSC/3in1    Recommendations for Other Services       Precautions / Restrictions Precautions Precautions: Fall Restrictions Weight Bearing Restrictions: No     Mobility  Bed Mobility Overal bed mobility: Needs Assistance Bed Mobility: Supine to Sit;Sit to Supine     Supine to sit: Mod assist Sit to supine: Mod assist   General bed mobility comments: mod assist for BLE advancement and trunk elevation; able to use BLE to push towards [facility]    Transfers                   General transfer comment: deferred this session secondary to fatigue    Ambulation/Gait                    Stairs             Wheelchair Mobility    Modified Rankin (Stroke Patients Only)       Balance Overall balance assessment: Needs assistance Sitting-balance support: Feet supported Sitting balance-Leahy Scale: Poor Sitting balance - Comments: needed support to sit upright in bed with L lateral lean, improved throughout session Postural control: Left lateral lean     Standing balance comment: did not stand this session                            Cognition Arousal/Alertness: Awake/alert Behavior During Therapy: Flat affect Overall Cognitive Status: Impaired/Different from baseline Area of Impairment: Memory;Attention;Following commands;Safety/judgement;Problem solving;Awareness                   Current Attention Level: Focused   Following Commands: Follows one step commands inconsistently;Follows one step commands with increased time Safety/Judgement: Decreased awareness of safety;Decreased awareness of deficits Awareness: Intellectual Problem Solving: Slow processing;Decreased initiation;Difficulty sequencing;Requires verbal cues General Comments: pt fatigued but agreeable to therapy.Limited verbalizations during session. Follows short, simple commands with increased time        Exercises General Exercises - Lower Extremity Long Arc Quad: AROM;Both;10 reps;Seated Hip Flexion/Marching: AROM;Seated;5 reps;Both Heel Raises: AROM;Both;15 reps;Seated Other Exercises Other Exercises: trunk lean onto elbows (2 reps each side)    General Comments General comments (skin integrity, edema, etc.): Pre-therapy BP: 98/52, BP sitting EOB 144/74 (not sure of accuracy due  to placement of BP cuff on lower leg). Pt requiring verbal cues and demonstration for EOB exercises.      Pertinent Vitals/Pain Pain Assessment: Faces Faces Pain Scale: Hurts little more Pain Location: R leg Pain Descriptors / Indicators: Discomfort;Grimacing Pain Intervention(s):  Monitored during session    Home Living                          Prior Function            PT Goals (current goals can now be found in the care plan section) Acute Rehab PT Goals Patient Stated Goal: walk PT Goal Formulation: With patient Time For Goal Achievement: 02/13/21 Potential to Achieve Goals: Fair Progress towards PT goals: Goals downgraded-see care plan    Frequency    Min 2X/week      PT Plan Current plan remains appropriate    Co-evaluation              AM-PAC PT "6 Clicks" Mobility   Outcome Measure  Help needed turning from your back to your side while in a flat bed without using bedrails?: A Lot Help needed moving from lying on your back to sitting on the side of a flat bed without using bedrails?: A Lot Help needed moving to and from a bed to a chair (including a wheelchair)?: Total Help needed standing up from a chair using your arms (e.g., wheelchair or bedside chair)?: Total Help needed to walk in hospital room?: Total Help needed climbing 3-5 steps with a railing? : Total 6 Click Score: 8    End of Session   Activity Tolerance: Patient limited by fatigue;Patient tolerated treatment well Patient left: in bed;with nursing/sitter in room;with bed alarm set;with call bell/phone within reach Nurse Communication: Mobility status PT Visit Diagnosis: Muscle weakness (generalized) (M62.81);Other abnormalities of gait and mobility (R26.89)     Time: 3557-3220 PT Time Calculation (min) (ACUTE ONLY): 18 min  Charges:  $Therapeutic Exercise: 8-22 mins                     Brandon Melnick, SPT    Brandon Melnick 02/04/2021, 1:14 PM

## 2021-02-04 NOTE — Progress Notes (Signed)
[facility] [place] PROGRESS NOTE  ASSESSMENT / PLAN: [name] is a 70 y.o. female status post right brachial brachial bypass with right greater saphenous vein for bleeding ruptured pseudoaneurysm.  She had persistent oozing from her surgical sites, likely from Eliquis and Plavix.  We will need to hold these over the next 24 hours.  Keep dry dressings on the operative sites over the next 24 hours.  We will place a VAC on the right upper extremity incision tomorrow if hemostasis is adequate.  SUBJECTIVE: Seen on dialysis.  No complaints.  Noted multiple dressing changes over last 24 hours from ooze.  OBJECTIVE: BP (!) 103/48   Pulse 91   Temp 97.6 F (36.4 C) (Oral)   Resp (!) 25   Ht 5\' 2"  (1.575 m)   Wt 39.6 kg   LMP  (LMP Unknown)   SpO2 100%   BMI 15.97 kg/m   Intake/Output Summary (Last 24 hours) at 02/04/2021 0942 Last data filed at 02/04/2021 0815 Gross per 24 hour  Intake 1756.99 ml  Output 185 ml  Net 1571.99 ml    Constitutional: Chronically ill appearing.  No acute distress. Cardiac: Regular rate and rhythm. Pulmonary: Unlabored breathing Abdomen: Soft Vascular: Plus right radial pulse.  Dry dressings to the thigh and arm.  CBC Latest Ref Rng & Units 02/04/2021 02/03/2021 02/03/2021  WBC 4.0 - 10.5 K/uL 6.8 7.6 11.1(H)  Hemoglobin 12.0 - 15.0 g/dL 6.4(LL) 8.1(L) 8.1(L)  Hematocrit 36.0 - 46.0 % 20.6(L) 26.5(L) 27.7(L)  Platelets 150 - 400 K/uL 143(L) 172 192     CMP Latest Ref Rng & Units 02/04/2021 02/03/2021 02/01/2021  Glucose 70 - 99 mg/dL 79 97 94  BUN 8 - 23 mg/dL 28(H) 18 15  Creatinine 0.44 - 1.00 mg/dL 5.93(H) 4.70(H) 3.88(H)  Sodium 135 - 145 mmol/L 132(L) 135 134(L)  Potassium 3.5 - 5.1 mmol/L 5.9(H) 4.2 3.8  Chloride 98 - 111 mmol/L 100 101 101  CO2 22 - 32 mmol/L 22 - 24  Calcium 8.9 - 10.3 mg/dL 8.5(L) - 9.1  Total Protein 6.5 - 8.1 g/dL - - 7.0  Total Bilirubin 0.3 - 1.2 mg/dL - - 0.2(L)  Alkaline Phos 38 - 126 U/L  - - 67  AST 15 - 41 U/L - - 38  ALT 0 - 44 U/L - - 26    Estimated Creatinine Clearance: 5.8 mL/min (A) (by C-G formula based on SCr of 5.93 mg/dL (H)).  [name]. Tina Breed, MD [facility] Phone Number: [phone] 02/04/2021 9:42 AM

## 2021-02-04 NOTE — Progress Notes (Signed)
Pharmacy Antibiotic Note Pharmacy Consult for vancomycin  Indication:  MRSA infection of AV fistula    Height: 5\' 2"  (157.5 cm) Weight: 39.6 kg (87 lb 4.8 oz) IBW/kg (Calculated) : 50.1  Temp (24hrs), Avg:97.6 F (36.4 C), Min:96.8 F (36 C), Max:98 F (36.7 C)  Recent Labs  Lab 01/30/21 1749 01/31/21 0246 02/01/21 0608 02/03/21 0952 02/03/21 1538 02/03/21 2131 02/04/21 0332  WBC  --  4.3 4.1  --  11.1* 7.6 6.8  CREATININE 5.41* 2.02* 3.88* 4.70*  --   --  5.93*  VANCORANDOM  --   --   --   --   --   --  16     Estimated Creatinine Clearance: 5.8 mL/min (A) (by C-G formula based on SCr of 5.93 mg/dL (H)).    Allergies  Allergen Reactions   Lactose Intolerance (Gi) Other (See Comments)    On MAR   Latex Rash and Itching   Penicillins Rash and Swelling    Has patient had a PCN reaction causing immediate rash, facial/tongue/throat swelling, SOB or lightheadedness with hypotension: No Has patient had a PCN reaction causing severe rash involving mucus membranes or skin necrosis: No Has patient had a PCN reaction that required hospitalization No Has patient had a PCN reaction occurring within the last 10 years: No If all of the above answers are "NO", then may proceed with Cephalosporin use.      Assessment:  70yo female admitted on 01/23/2021 with MRSA infection of right AV fistula on vancomycin PTA with plan to continue to 12/14 (started 10/28, for 6 weeks).  Patient gets dialysis TTSa but has received inconsistent outpatient antibiotic therapy due to missing hemodialysis or ending session early.  Pharmacy was consulted on 01/27/21 for vancomycin dosing.  Continues on Vancomycin 500 mg IV TTS with HD.   Continues on scheduled TTS HD, currently in HD for dialysis now.  Pre- HD vancomycin level today 02/04/21 is 16 mcg/ml, therapeutic pre HD level.  Goal =15-25 mcg/ml.  Will continue Vancomycin 500 mg IV qHD-TTS.   Afebrile, WBC within normal.   Antimicrobials this  admission: Vancomycin 10/28  >> 12/14    Vanc levels/ Dose adjustments this admission: 11/28 Vanc Random level =18 (on vancomycin 500 mg with TTSa  12/6  Vanc Random level = 16 mcg/ml on vanc 500mg  qHD-TTSa.  Microbiology results: 11/25 BCx x2: negative 11/26 MRSA PCR: neg  11/26 CSF cx: negative 10/28 Arm abscess + MRSA  12/5 SARS Covid PCR: neg: flu A/B: neg  Goal of Therapy:  Pre HD vancomycin random level = 15-25 mcg/ml   Plan:  Continue Vancomycin 500mg  Post-HD on TTSa F/u HD schedule, adjust doses as needed End date for IV Vanc planned 02/12/21, last dose will be given with HD next Tues on 02/11/21 , thus no further levels planned.  Thank you for allowing pharmacy to be a part of this patient's care. Nicole Cella, RPh Clinical Pharmacist [phone] 02/04/2021 9:06 AM  Please check AMION for all [place] phone numbers After 10:00 PM, call [place] [phone]

## 2021-02-04 NOTE — Progress Notes (Signed)
The patient's Hgb at 2131 was 8.1. This morning at 0332 it was 6.4.  Informed the on call physician who ordered to give 1 Unit of RBCs.  Will continue to monitor.  Lupita Dawn, RN

## 2021-02-04 NOTE — Progress Notes (Signed)
Tina NOTE  ANALYSSE QUINONEZ DTO:[id number] DOB: 1950-10-20 DOA: 01/23/2021 PCP: Hilbert Corrigan, Tina   LOS: 12 days   Brief Narrative / Interim history: 70 year old female who has a history of a flutter on [name], [name], [name], [name], [name], [name] consistently administered due to early termination of her [name], was brought to [facility] on 11/24 due to a syncopal episode while undergoing [name].  She also had intermittent confusion at that time.  After admission, on 11/25 became hypertensive, had a seizure and neurology was consulted.  An MRI of the brain was concerning for press.  She was started on Cardene drip and she was transferred to [facility] for ICU management and continuous EEG.  Her mental status continues to improve.  On 11/28 failed bedside speech eval and core track was placed.  On 11/29 her continuous EEG has been discontinued, and with continued improvement she was transferred to the hospitalist service on 12/1.  Nephrology also following for her dialysis needs.  Subjective: Patient was seen at hemodialysis.  Overnight events noted.  Patient denies any chest pain shortness of breath.  No nausea vomiting.  Patient's mentation seems to be back to baseline.  Continue to monitor.  Assessment & Plan:  Acute encephalopathy with press, hypertensive emergency Patient had a seizure episode in the setting of [name].  MRI showed cortical and subcortical edema in the occipital parietal lobes bilaterally.  She was subsequently admitted to the ICU, improved and transferred to the hospitalist service on 12/1.  She was on amlodipine and metoprolol.  However over the last 24 hours her blood pressures have been low likely from bleeding.  We will hold her antihypertensives for now.  Patient's mentation seems to be back to baseline.    Seizure in the setting of press Neurology was  consulted.  EEG initially showed nonconvulsive status epilepticus.  She underwent continuous EEG monitoring.   Currently on Vimpat and Keppra.   Has been seizure-free.    MRSA infection of the right AV Tina She was hospitalized 10/25 - 01/10/19/2022 with this, status post incision and drainage and ligation of the AV Tina on 10/28.   ID was consulted and recommending vancomycin with dialysis for total of 6 weeks with a last day 02/12/2021.   Continue vancomycin for now.  There are some reports that she might have missed dialysis and antibiotics however patient denies, and overall this is [name] clear.  Discussed with nephrology, they are [name] sure either. Wound has been stable.  Pseudoaneurysm rupture right arm Tina Developed bleeding from her Tina on 12/5.  Had to be taken to the OR emergently.  Underwent control of hemorrhage and creation of a right brachial to brachial bypass.  Had a lot of bleeding from her saphenous vein harvesting site in the right lower extremity as well as from the right upper extremity.  This is likely because patient was on Plavix and [name].  These have been held for now.  Vascular surgery is following.  Peripheral vascular disease/innominate vein stenting Stent in the innominate vein was placed in 2019.  Has had multiple angioplasties since then.  She was placed on Plavix at that time.  Now she is on [name].  Will discuss with the vascular regarding Plavix and if it can be discontinued.  Acute blood loss anemia/anemia of chronic disease Drop in hemoglobin is from bleeding.  To be transfused 2 units  of PRBC today.  We will recheck hemoglobin later today.  Thrombocytopenia Stable.  Noted to be 143,000 today.  Paroxysmal atrial fibrillation with rapid ventricular response Noted to have episodes of bradycardia overnight.  Metoprolol has been held for now.  [name] is on hold due to active bleeding.    Tina, on [name].  Being dialyzed on TTS schedule.   Nephrology is following.   [name]/oral thrush On antiretrovirals.  Continue Diflucan.   Severe protein calorie malnutrition  Cleared for oral intake by speech therapy.  Was on core track tube feeding.  [name] -PPI   Hyponatremia-volume managed with dialysis   DVT prophylaxis: Continue [name] CODE STATUS: DNR Family communication: No family at bedside Disposition: Skilled nursing facility when bed is available.  Status is: Inpatient  Remains inpatient appropriate because: Encephalopathy, seizure   Scheduled Meds:  sodium chloride   Intravenous Once   sodium chloride   Intravenous Once   sodium chloride   Intravenous Once   calcitRIOL  1.25 mcg Oral Q T,Th,Sa-[name]   calcium acetate (Phos Binder)  1,334 mg Oral TID WC   chlorhexidine  15 mL Mouth Rinse BID   Chlorhexidine Gluconate Cloth  6 each Topical Q0600   cinacalcet  90 mg Oral Q T,Th,Sa-[name]   darbepoetin (ARANESP) injection - DIALYSIS  40 mcg Intravenous Q Tue-[name]   diphenhydrAMINE       dolutegravir  50 mg Oral q1600   fluconazole  100 mg Oral Daily   heparin sodium (porcine)       lacosamide  150 mg Oral BID   lamiVUDine  50 mg Oral Daily   levETIRAcetam  1,000 mg Oral Daily   levETIRAcetam  250 mg Oral Q T,Th,Sat-1800   lidocaine-prilocaine  1 application Topical Q T,Th,Sa-[name]   mouth rinse  15 mL Mouth Rinse q12n4p   mirtazapine  15 mg Oral QHS   nystatin  5 mL Oral QID   pantoprazole  40 mg Oral QHS   senna  2 tablet Oral QPM   thiamine  100 mg Oral Daily   vancomycin  500 mg Intravenous Q T,Th,Sa-[name]   venlafaxine  75 mg Oral BID   zidovudine  100 mg Oral Q8H   Continuous Infusions:   PRN Meds:.acetaminophen **OR** acetaminophen, hydrALAZINE, HYDROmorphone (DILAUDID) injection, lanolin/mineral oil, lidocaine, loperamide, LORazepam, ondansetron **OR** ondansetron (ZOFRAN) IV, ondansetron, oxyCODONE-acetaminophen, polyethylene glycol  Diet Orders (From admission, onward)     Start     Ordered   02/03/21 1600   Diet regular Room service appropriate? Yes with Assist; Fluid consistency: Thin  Diet effective now       Question Answer Comment  Room service appropriate? Yes with Assist   Fluid consistency: Thin      02/03/21 1600               Consultants:  PCCM Nephrology  Procedures:  As above  Microbiology  none  Antimicrobials: Vancomycin    Objective: Vitals:   02/04/21 0830 02/04/21 0900 02/04/21 1001 02/04/21 1112  BP:  (!) 103/48 (!) 101/55 (!) 98/52  Pulse: 84 91 71 69  Resp: (!) 22 (!) 25 18 14   Temp:   (!) 97.5 F (36.4 C) (!) 97.5 F (36.4 C)  TempSrc:   Oral Oral  SpO2: 99% 100% 98% 97%  Weight:      Height:        Intake/Output Summary (Last 24 hours) at 02/04/2021 1115 Last data filed at 02/04/2021 0815 Gross per 24 hour  Intake  1756.99 ml  Output 185 ml  Net 1571.99 ml    Filed Weights   02/01/21 1223 02/03/21 0500 02/03/21 1913  Weight: 38.5 kg 39.4 kg 39.6 kg    Examination:  General appearance: Awake alert.  In no distress Resp: Clear to auscultation bilaterally.  Normal effort Cardio: S1-S2 is normal regular.  No S3-S4.  No rubs murmurs or bruit GI: Abdomen is soft.  Nontender nondistended.  Bowel sounds are present normal.  No masses organomegaly Extremities: Dressing noted over the right upper and lower extremity  Neurologic: No obvious focal neurological deficits.    Data Reviewed: I have independently reviewed following labs and imaging studies   CBC: Recent Labs  Lab 01/31/21 0246 02/01/21 0608 02/03/21 0952 02/03/21 1538 02/03/21 2131 02/04/21 0332  WBC 4.3 4.1  --  11.1* 7.6 6.8  HGB 9.1* 8.9* 11.2* 8.1* 8.1* 6.4*  HCT 30.0* 29.5* 33.0* 27.7* 26.5* 20.6*  MCV 98.7 102.4*  --  103.7* 101.9* 101.5*  PLT 114* 126*  --  192 172 143*    Basic Metabolic Panel: Recent Labs  Lab 01/29/21 0203 01/29/21 1621 01/30/21 0206 01/30/21 1749 01/30/21 1749 01/31/21 0246 01/31/21 2030 02/01/21 0608 02/03/21 0952  02/04/21 0332  NA  --   --   --  128*  --  133*  --  134* 135 132*  K  --   --   --  4.2   < > 3.2* 3.4* 3.8 4.2 5.9*  CL  --   --   --  93*  --  100  --  101 101 100  CO2  --   --   --  24  --  26  --  24  --  22  GLUCOSE  --   --   --  92  --  102*  --  94 97 79  BUN  --   --   --  31*  --  5*  --  15 18 28*  CREATININE  --   --   --  5.41*  --  2.02*  --  3.88* 4.70* 5.93*  CALCIUM  --   --   --  7.7*  --  8.5*  --  9.1  --  8.5*  MG 1.7 1.8 1.8  --   --   --  1.9  --   --   --   PHOS 3.9 3.7 3.7 3.2  --   --   --   --   --  4.3   < > = values in this interval [name] displayed.    Liver Function Tests: Recent Labs  Lab 01/30/21 1749 01/31/21 0246 02/01/21 0608 02/04/21 0332  AST  --  25 38  --   ALT  --  15 26  --   ALKPHOS  --  73 67  --   BILITOT  --  0.3 0.2*  --   PROT  --  7.1 7.0  --   ALBUMIN 2.5* 2.7* 2.7* 2.4*    CBG: Recent Labs  Lab 02/03/21 2029 02/04/21 0031 02/04/21 0434 02/04/21 0855 02/04/21 1005  GLUCAP 131* 79 76 87 88     Recent Results (from the past 240 hour(s))  CSF culture w Gram Stain     Status: None   Collection Time: 01/25/21  2:43 PM   Specimen: CSF; Cerebrospinal Fluid  Result Value Ref Range Status   Specimen Description CSF  Final   Special Requests Immunocompromised  Final   Gram Stain   Final    WBC PRESENT, PREDOMINANTLY PMN NO ORGANISMS SEEN CYTOSPIN SMEAR    Culture   Final    NO GROWTH Performed at [facility], [place] [address], [place], [place] [address]    Report Status 01/29/2021 FINAL  Final  Resp Panel by RT-PCR (Flu A&B, Covid) Nasopharyngeal Swab     Status: None   Collection Time: 02/03/21  5:59 PM   Specimen: Nasopharyngeal Swab; Nasopharyngeal(NP) swabs in vial transport medium  Result Value Ref Range Status   SARS Coronavirus 2 by RT PCR NEGATIVE NEGATIVE Final    Comment: (NOTE) SARS-CoV-2 target nucleic acids are [name] DETECTED.  The SARS-CoV-2 RNA is generally detectable in upper  respiratory specimens during the acute phase of infection. The lowest concentration of SARS-CoV-2 viral copies this assay can detect is 138 copies/mL. A negative result does [name] preclude SARS-Cov-2 infection and should [name] be used as the sole basis for treatment or other patient management decisions. A negative result may occur with  improper specimen collection/handling, submission of specimen other than nasopharyngeal swab, presence of viral mutation(s) within the areas targeted by this assay, and inadequate number of viral copies(<138 copies/mL). A negative result must be combined with clinical observations, patient history, and epidemiological information. The expected result is Negative.  Fact Sheet for Patients:  EntrepreneurPulse.com.au  Fact Sheet for Healthcare Providers:  IncredibleEmployment.be  This test is no t yet approved or cleared by the [place] FDA and  has been authorized for detection and/or diagnosis of SARS-CoV-2 by FDA under an Emergency Use Authorization (EUA). This EUA will remain  in effect (meaning this test can be used) for the duration of the COVID-19 declaration under Section 564(b)(1) of the Act, 21 U.S.C.section 360bbb-3(b)(1), unless the authorization is terminated  or revoked sooner.       Influenza A by PCR NEGATIVE NEGATIVE Final   Influenza B by PCR NEGATIVE NEGATIVE Final    Comment: (NOTE) The Xpert Xpress SARS-CoV-2/FLU/RSV plus assay is intended as an aid in the diagnosis of influenza from Nasopharyngeal swab specimens and should [name] be used as a sole basis for treatment. Nasal washings and aspirates are unacceptable for Xpert Xpress SARS-CoV-2/FLU/RSV testing.  Fact Sheet for Patients: EntrepreneurPulse.com.au  Fact Sheet for Healthcare Providers: IncredibleEmployment.be  This test is [name] yet approved or cleared by the [place] FDA and has been  authorized for detection and/or diagnosis of SARS-CoV-2 by FDA under an Emergency Use Authorization (EUA). This EUA will remain in effect (meaning this test can be used) for the duration of the COVID-19 declaration under Section 564(b)(1) of the Act, 21 U.S.C. section 360bbb-3(b)(1), unless the authorization is terminated or revoked.  Performed at [facility], [place] [address] [place], [place] [address]       Radiology Studies: No results found.  Bonnielee Haff 02/04/2021  [facility]

## 2021-02-04 NOTE — Progress Notes (Signed)
Patient was transferred to 4 [place] from unit 5 [place] at the start of the night shift around 1910.  When she arrived in her room it was noted that the ACE wrap on her right leg over her surgical incisions had bloody drainage. The area was marked with a marker but two minutes later the size of the drainage had doubled in size.  The nurses used several 4x4 gauze pads and held down pressure on the bloody area.  After 30 minutes drainage was still coming through and soaking the gauze.  The RRT and [name] were informed and arrived to assess the patient.  [name] unwrapped the leg dressing and spotted areas where the staples had come off the incision and were oozing continuously.  [name] used a special clotting cloth to pack in the unclosed areas of the incision and applied 4x4s over it and wrapped it up with Kerlex and an ACE wrap. The doctor said the dressing should not be removed and to notify him if the draining continues.  Will continue to monitor.  Lupita Dawn, RN

## 2021-02-04 NOTE — Progress Notes (Signed)
Patient with  nausea during hemodialysis UF turned off. Patient states she feels weak. Blood sugar checked with result of 87. Zofran given for nausea.

## 2021-02-04 NOTE — Progress Notes (Signed)
Initial Nutrition Assessment  DOCUMENTATION CODES:   Severe malnutrition in context of chronic illness, Underweight  INTERVENTION:   Encourage good PO intake  Ordered snacks  NUTRITION DIAGNOSIS:   Severe Malnutrition related to chronic illness (ESRD) as evidenced by severe fat depletion, severe muscle depletion. - Ongoing  GOAL:   Patient will meet greater than or equal to 90% of their needs - Ongoing  MONITOR:   PO intake, I & O's, Skin, Labs  REASON FOR ASSESSMENT:   Consult Enteral/tube feeding initiation and management  ASSESSMENT:   Pt admitted to [facility] ED with syncope during dialysis on 11/24. On 11/25, pt had a seizure with noted hypertension. 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 PMH includes ESRD on HD (TTS), atrial flutter, chronic pain, depression, GERD, HIV, HTN, infection of fistula, and HTN.  11/28: Cortrak placed xray confirmed in stomach 11/29: FEES planned for today 12/02: Cortrak removed; diet advanced to regular  12/05: OR for ruptured pseudoaneurysm or R brachial artery  Pt very lethargic during visit, pt did not open eyes but would respond with one word answer to RD  questions.   Pt did not answer when asked about ONS, but pt did say ice cream would be too cold.   Pt agreed to snacks; RD to order pt a snack. RD made RN aware that a snack would be ordered and that it would be delivered to the unit and not bedside.   Per palliative notes, pt does not wish for short-term or long-term feeding tube to provide nutritional support. Pt has deferred to only PO oral intake.   Pt only received 1.5 hours of dialysis today and sign-ed off AMA.  Medications reviewed and include: Calcitriol, Calcium Acetate, Cinacalcet, Aranesp, Tivicay, Fluconazole, Remeron, Protonix, Senokot, Thiamine, Vancomycin Labs reviewed:  - Sodium 132  - Potassium 5.9  Diet Order:   Diet Order             Diet regular Room service  appropriate? Yes with Assist; Fluid consistency: Thin  Diet effective now                   EDUCATION NEEDS:   No education needs have been identified at this time  Skin:  Skin Assessment: Skin Integrity Issues: Skin Integrity Issues:: Incisions Incisions: R Arm & Thigh  Last BM:  02/02/2021  Height:   Ht Readings from Last 1 Encounters:  02/03/21 5\' 2"  (1.575 m)    Weight:   Wt Readings from Last 1 Encounters:  02/03/21 39.6 kg   BMI:  Body mass index is 15.97 kg/m.  Estimated Nutritional Needs:   Kcal:  1500-1700  Protein:  75-85g  Fluid:  UOP + 1L   Annamaria Salah Louie Casa, RD, LDN Clinical Dietitian See [facility] for contact information.

## 2021-02-04 NOTE — Progress Notes (Signed)
Patient CBG 62. Educated patient on importance of eating / drinking to bring this value up. Patient drank 3-4 sips of juice and refused any further. MD notified. D50 half amp given per order. Will continue to monitor.  Daymon Larsen, RN

## 2021-02-04 NOTE — Progress Notes (Signed)
Patient requesting early treatment termination. Dr Candiss Norse at bedside and aware.

## 2021-02-04 NOTE — Care Management Important Message (Signed)
Important Message  Patient Details  Name: [name] MRN: [id number] Date of Birth: 1950/09/21   Medicare Important Message Given:  Yes     [name] 02/04/2021, 8:42 AM

## 2021-02-04 NOTE — Progress Notes (Signed)
[facility] NEPHROLOGY PROGRESS NOTE  Assessment/ Plan:  Outpatient HD orders:[name] TTS,EDW 41.5 kg,Bath 2K/2.5 ca,BF 350  DF 500,Time - 180 minutes Access catheter  Medications - out of hectorol and now on calcitriol - 1.25 mcg each tx. Sensipar 90 mg three times a week; Epogen 3600 units each tx, Heparin 1000 units bolus then 500 units per hour Has been on vanc 500 mg each tx (though misses often)   # Acute encephalopathy: EEG with moderate diffuse encephalopathy of nonspecific etiology.  Based on neurology evaluation appears to be consistent with PRES along with imaging showing no evidence of acute CVA.  Seen by neurology.  Mental status seems seems to be stable.  # ESRD: She is on outpatient TTS schedule. Plan for next HD on 12/8.  Left IJ TDC for access.  # Anemia:  continue ESA with HD. Transfuse prn  # CKD-MBD: Resumed  calcitriol and Sensipar for PTH control.  Started calcium acetate for hyperphosphatemia and the repeat lab looks better.  Continue to monitor.  #  Right radiocephalic fistula infection: Status post ligation and debridement on 10/28 with inconsistent outpatient antibiotic therapy due to missing hemodialysis or signing off early.  Restarted back on antibiotic therapy with vancomycin.  #Ruptured pseudoaneurysm -taken to OR urgently on 12/5 for re-do and rt brachial brachial bypass. Appreciate VVS assistance  # Hypertension:  She is currently on amlodipine, hydralazine and metoprolol dose was increased yesterday.  Volume status acceptable.  Continue to monitor blood pressure, UF as tolerated with HD.    #Hypokalemia complicated by diarrhea.  Increased potassium bath during dialysis initially. Hyperkalemic today--likely related to blood products? Will get back to a 2k bath with HD  Subjective: Seen and examined on HD. Hgb down to 6.4, to receive PRBC. Patient has been having vomiting and diarrhea especially during her treatment today. She reports she  is signing off dialysis (received around 1.5hrs of HD, signing off HD AMA). Objective Vital signs in last 24 hours: Vitals:   02/04/21 0800 02/04/21 0815 02/04/21 0830 02/04/21 0900  BP: (!) 108/58 (!) 108/58  (!) 103/48  Pulse: 71 70 84 91  Resp: 17 16 (!) 22 (!) 25  Temp:  97.6 F (36.4 C)    TempSrc:  Oral    SpO2: 100% 99% 99% 100%  Weight:      Height:       Weight change: 0.2 kg  Intake/Output Summary (Last 24 hours) at 02/04/2021 0940 Last data filed at 02/04/2021 0815 Gross per 24 hour  Intake 1756.99 ml  Output 185 ml  Net 1571.99 ml       Labs: Basic Metabolic Panel: Recent Labs  Lab 01/30/21 0206 01/30/21 1749 01/30/21 1749 01/31/21 0246 01/31/21 2030 02/01/21 0608 02/03/21 0952 02/04/21 0332  NA  --  128*   < > 133*  --  134* 135 132*  K  --  4.2   < > 3.2*   < > 3.8 4.2 5.9*  CL  --  93*   < > 100  --  101 101 100  CO2  --  24   < > 26  --  24  --  22  GLUCOSE  --  92   < > 102*  --  94 97 79  BUN  --  31*   < > 5*  --  15 18 28*  CREATININE  --  5.41*   < > 2.02*  --  3.88* 4.70* 5.93*  CALCIUM  --  7.7*   < > 8.5*  --  9.1  --  8.5*  PHOS 3.7 3.2  --   --   --   --   --  4.3   < > = values in this interval not displayed.   Liver Function Tests: Recent Labs  Lab 01/31/21 0246 02/01/21 0608 02/04/21 0332  AST 25 38  --   ALT 15 26  --   ALKPHOS 73 67  --   BILITOT 0.3 0.2*  --   PROT 7.1 7.0  --   ALBUMIN 2.7* 2.7* 2.4*   No results for input(s): LIPASE, AMYLASE in the last 168 hours. No results for input(s): AMMONIA in the last 168 hours.  CBC: Recent Labs  Lab 01/31/21 0246 02/01/21 0608 02/03/21 0952 02/03/21 1538 02/03/21 2131 02/04/21 0332  WBC 4.3 4.1  --  11.1* 7.6 6.8  HGB 9.1* 8.9*   < > 8.1* 8.1* 6.4*  HCT 30.0* 29.5*   < > 27.7* 26.5* 20.6*  MCV 98.7 102.4*  --  103.7* 101.9* 101.5*  PLT 114* 126*  --  192 172 143*   < > = values in this interval not displayed.   Cardiac Enzymes: No results for input(s):  CKTOTAL, CKMB, CKMBINDEX, TROPONINI in the last 168 hours. CBG: Recent Labs  Lab 02/03/21 1735 02/03/21 2029 02/04/21 0031 02/04/21 0434 02/04/21 0855  GLUCAP 159* 131* 79 76 87    Iron Studies: No results for input(s): IRON, TIBC, TRANSFERRIN, FERRITIN in the last 72 hours. Studies/Results: No results found.  Medications: Infusions:    Scheduled Medications:  sodium chloride   Intravenous Once   sodium chloride   Intravenous Once   sodium chloride   Intravenous Once   acetaminophen  650 mg Oral Once   calcitRIOL  1.25 mcg Oral Q T,Th,Sa-HD   calcium acetate (Phos Binder)  1,334 mg Oral TID WC   chlorhexidine  15 mL Mouth Rinse BID   Chlorhexidine Gluconate Cloth  6 each Topical Q0600   cinacalcet  90 mg Oral Q T,Th,Sa-HD   darbepoetin (ARANESP) injection - DIALYSIS  40 mcg Intravenous Q Tue-HD   diphenhydrAMINE       diphenhydrAMINE  25 mg Oral Once   dolutegravir  50 mg Oral q1600   fluconazole  100 mg Oral Daily   heparin sodium (porcine)       lacosamide  150 mg Oral BID   lamiVUDine  50 mg Oral Daily   levETIRAcetam  1,000 mg Oral Daily   levETIRAcetam  250 mg Oral Q T,Th,Sat-1800   lidocaine-prilocaine  1 application Topical Q T,Th,Sa-HD   mouth rinse  15 mL Mouth Rinse q12n4p   mirtazapine  15 mg Oral QHS   nystatin  5 mL Oral QID   pantoprazole  40 mg Oral QHS   senna  2 tablet Oral QPM   thiamine  100 mg Oral Daily   vancomycin  500 mg Intravenous Q T,Th,Sa-HD   venlafaxine  75 mg Oral BID   zidovudine  100 mg Oral Q8H    have reviewed scheduled and prn medications.  Physical Exam: General: chronically ill appearing Heart:RRR, s1s2 nl Lungs: Clear b/l, no crackle Abdomen:soft, Non-tender, non-distended Extremities:No peripheral edema, rue wound vac in place Dialysis Access: Left IJ TDC in place.  [name] 02/04/2021,9:40 AM  LOS: 12 days

## 2021-02-05 ENCOUNTER — Ambulatory Visit: Payer: Medicare Other

## 2021-02-05 DIAGNOSIS — N186 End stage renal disease: Secondary | ICD-10-CM | POA: Diagnosis not present

## 2021-02-05 DIAGNOSIS — I10 Essential (primary) hypertension: Secondary | ICD-10-CM | POA: Diagnosis not present

## 2021-02-05 DIAGNOSIS — G9340 Encephalopathy, unspecified: Secondary | ICD-10-CM | POA: Diagnosis not present

## 2021-02-05 DIAGNOSIS — I4811 Longstanding persistent atrial fibrillation: Secondary | ICD-10-CM | POA: Diagnosis not present

## 2021-02-05 LAB — CBC
HCT: 28.9 % — ABNORMAL LOW (ref 36.0–46.0)
Hemoglobin: 9.3 g/dL — ABNORMAL LOW (ref 12.0–15.0)
MCH: 29.6 pg (ref 26.0–34.0)
MCHC: 32.2 g/dL (ref 30.0–36.0)
MCV: 92 fL (ref 80.0–100.0)
Platelets: 125 10*3/uL — ABNORMAL LOW (ref 150–400)
RBC: 3.14 MIL/uL — ABNORMAL LOW (ref 3.87–5.11)
RDW: 22.3 % — ABNORMAL HIGH (ref 11.5–15.5)
WBC: 7 10*3/uL (ref 4.0–10.5)
nRBC: 0 % (ref 0.0–0.2)

## 2021-02-05 LAB — BPAM RBC
Blood Product Expiration Date: 202301012359
Blood Product Expiration Date: 202301012359
ISSUE DATE / TIME: 202212060513
ISSUE DATE / TIME: 202212061126
Unit Type and Rh: 5100
Unit Type and Rh: 5100

## 2021-02-05 LAB — TYPE AND SCREEN
ABO/RH(D): O POS
Antibody Screen: NEGATIVE
Unit division: 0
Unit division: 0

## 2021-02-05 LAB — GLUCOSE, CAPILLARY
Glucose-Capillary: 142 mg/dL — ABNORMAL HIGH (ref 70–99)
Glucose-Capillary: 63 mg/dL — ABNORMAL LOW (ref 70–99)
Glucose-Capillary: 65 mg/dL — ABNORMAL LOW (ref 70–99)
Glucose-Capillary: 75 mg/dL (ref 70–99)
Glucose-Capillary: 77 mg/dL (ref 70–99)
Glucose-Capillary: 80 mg/dL (ref 70–99)
Glucose-Capillary: 81 mg/dL (ref 70–99)
Glucose-Capillary: 84 mg/dL (ref 70–99)
Glucose-Capillary: 93 mg/dL (ref 70–99)

## 2021-02-05 MED ORDER — APIXABAN 2.5 MG PO TABS
2.5000 mg | ORAL_TABLET | Freq: Two times a day (BID) | ORAL | Status: DC
  Administered 2021-02-05 – 2021-02-10 (×10): 2.5 mg via ORAL
  Filled 2021-02-05 (×10): qty 1

## 2021-02-05 MED ORDER — DEXTROSE 50 % IV SOLN
INTRAVENOUS | Status: AC
  Filled 2021-02-05: qty 50

## 2021-02-05 MED ORDER — VANCOMYCIN HCL 500 MG/100ML IV SOLN
500.0000 mg | Freq: Once | INTRAVENOUS | Status: AC
  Administered 2021-02-05: 500 mg via INTRAVENOUS
  Filled 2021-02-05: qty 100

## 2021-02-05 MED ORDER — DEXTROSE 50 % IV SOLN
INTRAVENOUS | Status: AC
  Filled 2021-02-05: qty 100

## 2021-02-05 MED ORDER — METOPROLOL TARTRATE 12.5 MG HALF TABLET
12.5000 mg | ORAL_TABLET | Freq: Two times a day (BID) | ORAL | Status: DC
Start: 2021-02-05 — End: 2021-02-06
  Administered 2021-02-05: 12.5 mg via ORAL
  Filled 2021-02-05: qty 1

## 2021-02-05 MED ORDER — DEXTROSE 50 % IV SOLN
INTRAVENOUS | Status: AC
  Administered 2021-02-05: 50 mL
  Filled 2021-02-05: qty 50

## 2021-02-05 NOTE — Progress Notes (Signed)
Spoke to [place] at [facility] to advise clinic that pt will need iv vancomycin until 12/14 with HD. Will assist as needed.  Melven Sartorius Renal Navigator [phone]

## 2021-02-05 NOTE — Progress Notes (Signed)
[facility] NEPHROLOG[facility] PROGRESS NOTE  Assessment/ Plan:  Outpatient HD orders:[name] TTS,EDW 41.5 kg,Bath 2K/2.5 ca,BF 350  DF 500,Time - 180 minutes Access catheter  Medications - out of hectorol and now on calcitriol - 1.25 mcg each tx. Sensipar 90 mg three times a week; Epogen 3600 units each tx, Heparin 1000 units bolus then 500 units per hour Has been on vanc 500 mg each tx (though misses often)   # Acute encephalopathy: EEG with moderate diffuse encephalopathy of nonspecific etiology.  Based on neurology evaluation appears to be consistent with PRES along with imaging showing no evidence of acute CVA.  Seen by neurology.  Mental status seems seems to be stable.  # ESRD: She is on outpatient TTS schedule. Plan for next HD on 12/8.  Left IJ TDC for access.  # Anemia:  continue ESA with HD. Transfuse prn  # CKD-MBD: Resumed  calcitriol and Sensipar for PTH control.  Started calcium acetate for hyperphosphatemia and the repeat lab looks better.  Continue to monitor.  #  Right radiocephalic fistula infection: Status post ligation and debridement on 10/28 with inconsistent outpatient antibiotic therapy due to missing hemodialysis or signing off early.  Restarted back on antibiotic therapy with vancomycin.  #Ruptured pseudoaneurysm -taken to OR urgently on 12/5 for re-do and rt brachial brachial bypass. Appreciate VVS assistance  # Hypertension:  She is currently on amlodipine, hydralazine and metoprolol dose was increased yesterday.  Volume status acceptable.  Continue to monitor blood pressure, UF as tolerated with HD.    #Hypokalemia complicated by diarrhea.  Increased potassium bath during dialysis initially. Hyperkalemic yesterday--likely related to blood products? Will get back to a 2k bath with HD  Subjective: Seen and examined bedside this AM. No acute events. Having intermittent vomiting and diarrhea. Objective Vital signs in last 24 hours: Vitals:    02/04/21 1952 02/05/21 0019 02/05/21 0406 02/05/21 0802  BP: (!) 142/78 111/61 (!) 120/59 139/78  Pulse: 75 100 78 79  Resp: 18 20 19 16   Temp: 99 F (37.2 C) 98.3 F (36.8 C) 98.3 F (36.8 C) 98.4 F (36.9 C)  TempSrc: Oral Oral Oral Oral  SpO2: 100% 100% 100% 100%  Weight:   41.5 kg   Height:       Weight change: 0.9 kg  Intake/Output Summary (Last 24 hours) at 02/05/2021 0830 Last data filed at 02/05/2021 0543 Gross per 24 hour  Intake 810 ml  Output 240 ml  Net 570 ml       Labs: Basic Metabolic Panel: Recent Labs  Lab 01/30/21 0206 01/30/21 1749 01/30/21 1749 01/31/21 0246 01/31/21 2030 02/01/21 0608 02/03/21 0952 02/04/21 0332  NA  --  128*   < > 133*  --  134* 135 132*  K  --  4.2   < > 3.2*   < > 3.8 4.2 5.9*  CL  --  93*   < > 100  --  101 101 100  CO2  --  24   < > 26  --  24  --  22  GLUCOSE  --  92   < > 102*  --  94 97 79  BUN  --  31*   < > 5*  --  15 18 28*  CREATININE  --  5.41*   < > 2.02*  --  3.88* 4.70* 5.93*  CALCIUM  --  7.7*   < > 8.5*  --  9.1  --  8.5*  PHOS  3.7 3.2  --   --   --   --   --  4.3   < > = values in this interval not displayed.   Liver Function Tests: Recent Labs  Lab 01/31/21 0246 02/01/21 0608 02/04/21 0332  AST 25 38  --   ALT 15 26  --   ALKPHOS 73 67  --   BILITOT 0.3 0.2*  --   PROT 7.1 7.0  --   ALBUMIN 2.7* 2.7* 2.4*   No results for input(s): LIPASE, AMYLASE in the last 168 hours. No results for input(s): AMMONIA in the last 168 hours.  CBC: Recent Labs  Lab 02/03/21 1538 02/03/21 2131 02/04/21 0332 02/04/21 1800 02/05/21 0151  WBC 11.1* 7.6 6.8 6.2 7.0  HGB 8.1* 8.1* 6.4* 9.1* 9.3*  HCT 27.7* 26.5* 20.6* 27.7* 28.9*  MCV 103.7* 101.9* 101.5* 90.8 92.0  PLT 192 172 143* 130* 125*   Cardiac Enzymes: No results for input(s): CKTOTAL, CKMB, CKMBINDEX, TROPONINI in the last 168 hours. CBG: Recent Labs  Lab 02/05/21 0016 02/05/21 0033 02/05/21 0404 02/05/21 0437 02/05/21 0800  GLUCAP 65*  75 63* 93 84    Iron Studies: No results for input(s): IRON, TIBC, TRANSFERRIN, FERRITIN in the last 72 hours. Studies/Results: No results found.  Medications: Infusions:    Scheduled Medications:  sodium chloride   Intravenous Once   sodium chloride   Intravenous Once   calcitRIOL  1.25 mcg Oral Q T,Th,Sa-HD   calcium acetate (Phos Binder)  1,334 mg Oral TID WC   chlorhexidine  15 mL Mouth Rinse BID   Chlorhexidine Gluconate Cloth  6 each Topical Q0600   cinacalcet  90 mg Oral Q T,Th,Sa-HD   darbepoetin (ARANESP) injection - DIALYSIS  40 mcg Intravenous Q Tue-HD   dolutegravir  50 mg Oral q1600   fluconazole  100 mg Oral Daily   lacosamide  150 mg Oral BID   lamiVUDine  50 mg Oral Daily   levETIRAcetam  1,000 mg Oral Daily   levETIRAcetam  250 mg Oral Q T,Th,Sat-1800   lidocaine-prilocaine  1 application Topical Q T,Th,Sa-HD   mouth rinse  15 mL Mouth Rinse q12n4p   mirtazapine  15 mg Oral QHS   nystatin  5 mL Oral QID   pantoprazole  40 mg Oral QHS   senna  2 tablet Oral QPM   thiamine  100 mg Oral Daily   vancomycin  500 mg Intravenous Q T,Th,Sa-HD   venlafaxine  75 mg Oral BID   zidovudine  100 mg Oral Q8H    have reviewed scheduled and prn medications.  Physical Exam: General: chronically ill appearing Heart:RRR, s1s2 nl Lungs: Clear b/l, no crackle Abdomen:soft, Non-tender, non-distended Extremities:No peripheral edema, rue wound vac in place Dialysis Access: Left IJ TDC in place.  Montie Swiderski 02/05/2021,8:30 AM  LOS: 13 days

## 2021-02-05 NOTE — Progress Notes (Addendum)
[facility]     Assessment/Planning: Post op day 2  status post right brachial brachial bypass with right greater saphenous vein for bleeding ruptured pseudoaneurysm.  a plug of 7 x 10 cm myriad matrix was inserted into the brachial arteriotomy   Right UE/LE well perfused.  No active bleeding with dressing change. OK to restart Eliquis and Plavix tonight per Dr. Stanford Breed. Will wait on wound vac until tomorrow to see how she does on anticoagulation     Subjective  - Soreness in the right arm and leg   Objective (!) 120/59 78 98.3 F (36.8 C) (Oral) 19 100%  Intake/Output Summary (Last 24 hours) at 02/05/2021 0744 Last data filed at 02/05/2021 0543 Gross per 24 hour  Intake 1225 ml  Output 240 ml  Net 985 ml    Palpable radial pulse right UE, incision are healing well dressing changed at bedside.  No active drainage/bleeding Right thigh incisions healing well without active drainage/bleeding Lungs non labored breathing Ambulated in her room with therapy.    Roxy Horseman 02/05/2021 7:44 AM --  Laboratory Lab Results: Recent Labs    02/04/21 1800 02/05/21 0151  WBC 6.2 7.0  HGB 9.1* 9.3*  HCT 27.7* 28.9*  PLT 130* 125*   BMET Recent Labs    02/03/21 0952 02/04/21 0332  NA 135 132*  K 4.2 5.9*  CL 101 100  CO2  --  22  GLUCOSE 97 79  BUN 18 28*  CREATININE 4.70* 5.93*  CALCIUM  --  8.5*    COAG Lab Results  Component Value Date   INR 1.2 01/24/2021   INR 1.14 12/28/2011   INR 1.10 12/13/2010   No results found for: PTT   VASCULAR STAFF ADDENDUM: I have independently interviewed and examined the patient. I agree with the above.  OK to restart Eliquis tonight. Does not need to resume Plavix. Mobilize as able with PT / OT / OOB.  [name]. Stanford Breed, MD [facility] Phone Number: [phone] 02/05/2021 3:04 PM

## 2021-02-05 NOTE — Progress Notes (Signed)
PROGRESS NOTE  [name] HKF:[id number] DOB: 02-04-1951 DOA: 01/23/2021 PCP: Hilbert Corrigan, MD   LOS: 13 days   Brief Narrative / Interim history: 70 year old female who has a history of a flutter on Eliquis, pulmonary hypertension, HIV, GERD, ESRD on TTS HD, fistula infection on vent treatment with HD as an outpatient but not consistently administered due to early termination of her HD, was brought to [facility] on 11/24 due to a syncopal episode while undergoing HD.  She also had intermittent confusion at that time.  After admission, on 11/25 became hypertensive, had a seizure and neurology was consulted.  An MRI of the brain was concerning for press.  She was started on Cardene drip and she was transferred to [facility] for ICU management and continuous EEG.  Her mental status continues to improve.  On 11/28 failed bedside speech eval and core track was placed.  On 11/29 her continuous EEG has been discontinued, and with continued improvement she was transferred to the hospitalist service on 12/1.  Nephrology also following for her dialysis needs.  Subjective: Patient was working with therapy.  She was walking in the room with a walker.  Denies any complaints this morning except for pain issues in the right upper and lower extremities.    Assessment & Plan:  Acute encephalopathy with press Patient had a seizure episode in the setting of hypertension.  MRI showed cortical and subcortical edema in the occipital parietal lobes bilaterally.  She was subsequently admitted to the ICU, improved and transferred to the hospitalist service on 12/1.  Blood pressure is currently well controlled with medications.  Mentation seems to be stable.  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  EEG initially showed nonconvulsive status epilepticus.  She underwent continuous EEG monitoring.   Currently on Vimpat and Keppra.   Has been seizure-free.    MRSA  infection of the right AV fistula She was hospitalized 10/25 - 01/10/19/2022 with this, status post incision and drainage and ligation of the AV fistula on 10/28.   ID was consulted and recommending vancomycin with dialysis for total of 6 weeks with a last day 02/12/2021.   There are some reports that she might have missed dialysis and antibiotics however patient denies, and overall this is not clear.  Discussed with nephrology, they are not sure either. Wound has been stable.  Continue vancomycin with hemodialysis.  Pseudoaneurysm rupture right arm fistula Developed bleeding from her fistula on 12/5.  Had to be taken to the OR emergently.  Underwent control of hemorrhage and creation of a right brachial to brachial bypass.  Had a lot of bleeding from her saphenous vein harvesting site in the right lower extremity as well as from the right upper extremity.  This is likely because patient was on Plavix and Eliquis.  These have been held for now.  Vascular surgery is following.  Cleared to resume Eliquis from tonight.  Plavix to be discontinued.  See discussion below.  Essential hypertension/hypertensive emergency Presented with significantly elevated blood pressures with concern for press.  See above.  She was on amlodipine and metoprolol.  However after her bleeding episode from her fistula her blood pressures dropped.  Her amlodipine and metoprolol were discontinued.  Blood pressure stable this morning.  Continue to monitor closely.  We will resume her antihypertensive once he gets into the hypertensive range.    Peripheral vascular disease/innominate vein stenting Stent in the innominate vein was placed in 2019.  Has had multiple angioplasties since then.  She was placed on Plavix at that time.  Now she is on Eliquis.  Discussed with Dr. Donzetta Matters about Plavix.  He is also reviewed the data.  She does not have any other indication for being on Plavix.  He recommends discontinuing it since patient is now on  Eliquis.  Acute blood loss anemia/anemia of chronic disease Drop in hemoglobin is from bleeding from her fistula.  Transfused 2 units of PRBC yesterday.  Hemoglobin responded appropriately and noted to be stable.  Bleeding appears to have subsided.  Hypoglycemia Noted to have low glucose levels yesterday due to poor oral intake.  Patient was told about the importance of eating.  She has has had some nausea and some vomiting.  We will continue to monitor for now.  Her abdomen is benign.  Thrombocytopenia Stable.    Paroxysmal atrial fibrillation with rapid ventricular response She was noted to be bradycardic on 12/5-6.  Her metoprolol had to be held.  Heart rate appears to have improved.  Could resume her metoprolol at a low dose from tonight.   Eliquis was placed on hold due to bleeding.  Could be resumed encourage oral intake.  Tonight.     ESRD, on HD.  Being dialyzed on TTS schedule.  Nephrology is following.   HIV/oral thrush On antiretrovirals.  Continue Diflucan.   Severe protein calorie malnutrition  Cleared for oral intake by speech therapy.  Was on core track tube feeding.  GERD -PPI   Hyponatremia-volume managed with dialysis   DVT prophylaxis: Continue Eliquis CODE STATUS: DNR Family communication: No family at bedside Disposition: Skilled nursing facility when stable  Status is: Inpatient  Remains inpatient appropriate because: Encephalopathy, seizure   Scheduled Meds:  sodium chloride   Intravenous Once   sodium chloride   Intravenous Once   calcitRIOL  1.25 mcg Oral Q T,Th,Sa-HD   calcium acetate (Phos Binder)  1,334 mg Oral TID WC   chlorhexidine  15 mL Mouth Rinse BID   Chlorhexidine Gluconate Cloth  6 each Topical Q0600   cinacalcet  90 mg Oral Q T,Th,Sa-HD   darbepoetin (ARANESP) injection - DIALYSIS  40 mcg Intravenous Q Tue-HD   dolutegravir  50 mg Oral q1600   fluconazole  100 mg Oral Daily   lacosamide  150 mg Oral BID   lamiVUDine  50 mg Oral  Daily   levETIRAcetam  1,000 mg Oral Daily   levETIRAcetam  250 mg Oral Q T,Th,Sat-1800   lidocaine-prilocaine  1 application Topical Q T,Th,Sa-HD   mouth rinse  15 mL Mouth Rinse q12n4p   mirtazapine  15 mg Oral QHS   nystatin  5 mL Oral QID   pantoprazole  40 mg Oral QHS   senna  2 tablet Oral QPM   thiamine  100 mg Oral Daily   vancomycin  500 mg Intravenous Q T,Th,Sa-HD   venlafaxine  75 mg Oral BID   zidovudine  100 mg Oral Q8H   Continuous Infusions:   PRN Meds:.acetaminophen **OR** acetaminophen, hydrALAZINE, HYDROmorphone (DILAUDID) injection, lanolin/mineral oil, lidocaine, loperamide, LORazepam, ondansetron **OR** ondansetron (ZOFRAN) IV, ondansetron, oxyCODONE-acetaminophen, polyethylene glycol  Diet Orders (From admission, onward)     Start     Ordered   02/03/21 1600  Diet regular Room service appropriate? Yes with Assist; Fluid consistency: Thin  Diet effective now       Question Answer Comment  Room service appropriate? Yes with Assist   Fluid consistency: Thin  02/03/21 1600               Consultants:  PCCM Nephrology  Procedures:  As above  Microbiology  none  Antimicrobials: Vancomycin    Objective: Vitals:   02/04/21 1952 02/05/21 0019 02/05/21 0406 02/05/21 0802  BP: (!) 142/78 111/61 (!) 120/59 139/78  Pulse: 75 100 78 79  Resp: 18 20 19 16   Temp: 99 F (37.2 C) 98.3 F (36.8 C) 98.3 F (36.8 C) 98.4 F (36.9 C)  TempSrc: Oral Oral Oral Oral  SpO2: 100% 100% 100% 100%  Weight:   41.5 kg   Height:        Intake/Output Summary (Last 24 hours) at 02/05/2021 0912 Last data filed at 02/05/2021 0543 Gross per 24 hour  Intake 810 ml  Output 240 ml  Net 570 ml    Filed Weights   02/03/21 1913 02/04/21 0928 02/05/21 0406  Weight: 39.6 kg 40.5 kg 41.5 kg    Examination:  General appearance: Awake alert.  In no distress Resp: Clear to auscultation bilaterally.  Normal effort Cardio: S1-S2 is normal regular.  No S3-S4.   No rubs murmurs or bruit GI: Abdomen is soft.  Nontender nondistended.  Bowel sounds are present normal.  No masses organomegaly Extremities: No bleeding noted from her right arm or right leg.  Good pulses present. Neurologic:   No focal neurological deficits.     Data Reviewed: I have independently reviewed following labs and imaging studies   CBC: Recent Labs  Lab 02/03/21 1538 02/03/21 2131 02/04/21 0332 02/04/21 1800 02/05/21 0151  WBC 11.1* 7.6 6.8 6.2 7.0  HGB 8.1* 8.1* 6.4* 9.1* 9.3*  HCT 27.7* 26.5* 20.6* 27.7* 28.9*  MCV 103.7* 101.9* 101.5* 90.8 92.0  PLT 192 172 143* 130* 125*    Basic Metabolic Panel: Recent Labs  Lab 01/29/21 1621 01/30/21 0206 01/30/21 1749 01/30/21 1749 01/31/21 0246 01/31/21 2030 02/01/21 0608 02/03/21 0952 02/04/21 0332  NA  --   --  128*  --  133*  --  134* 135 132*  K  --   --  4.2   < > 3.2* 3.4* 3.8 4.2 5.9*  CL  --   --  93*  --  100  --  101 101 100  CO2  --   --  24  --  26  --  24  --  22  GLUCOSE  --   --  92  --  102*  --  94 97 79  BUN  --   --  31*  --  5*  --  15 18 28*  CREATININE  --   --  5.41*  --  2.02*  --  3.88* 4.70* 5.93*  CALCIUM  --   --  7.7*  --  8.5*  --  9.1  --  8.5*  MG 1.8 1.8  --   --   --  1.9  --   --   --   PHOS 3.7 3.7 3.2  --   --   --   --   --  4.3   < > = values in this interval not displayed.    Liver Function Tests: Recent Labs  Lab 01/30/21 1749 01/31/21 0246 02/01/21 0608 02/04/21 0332  AST  --  25 38  --   ALT  --  15 26  --   ALKPHOS  --  73 67  --   BILITOT  --  0.3 0.2*  --  PROT  --  7.1 7.0  --   ALBUMIN 2.5* 2.7* 2.7* 2.4*    CBG: Recent Labs  Lab 02/05/21 0016 02/05/21 0033 02/05/21 0404 02/05/21 0437 02/05/21 0800  GLUCAP 65* 75 63* 93 84     Recent Results (from the past 240 hour(s))  Resp Panel by RT-PCR (Flu A&B, Covid) Nasopharyngeal Swab     Status: None   Collection Time: 02/03/21  5:59 PM   Specimen: Nasopharyngeal Swab; Nasopharyngeal(NP) swabs  in vial transport medium  Result Value Ref Range Status   SARS Coronavirus 2 by RT PCR NEGATIVE NEGATIVE Final    Comment: (NOTE) SARS-CoV-2 target nucleic acids are NOT DETECTED.  The SARS-CoV-2 RNA is generally detectable in upper respiratory specimens during the acute phase of infection. The lowest concentration of SARS-CoV-2 viral copies this assay can detect is 138 copies/mL. A negative result does not preclude SARS-Cov-2 infection and should not be used as the sole basis for treatment or other patient management decisions. A negative result may occur with  improper specimen collection/handling, submission of specimen other than nasopharyngeal swab, presence of viral mutation(s) within the areas targeted by this assay, and inadequate number of viral copies(<138 copies/mL). A negative result must be combined with clinical observations, patient history, and epidemiological information. The expected result is Negative.  Fact Sheet for Patients:  EntrepreneurPulse.com.au  Fact Sheet for Healthcare Providers:  IncredibleEmployment.be  This test is no t yet approved or cleared by the [place] FDA and  has been authorized for detection and/or diagnosis of SARS-CoV-2 by FDA under an Emergency Use Authorization (EUA). This EUA will remain  in effect (meaning this test can be used) for the duration of the COVID-19 declaration under Section 564(b)(1) of the Act, 21 U.S.C.section 360bbb-3(b)(1), unless the authorization is terminated  or revoked sooner.       Influenza A by PCR NEGATIVE NEGATIVE Final   Influenza B by PCR NEGATIVE NEGATIVE Final    Comment: (NOTE) The Xpert Xpress SARS-CoV-2/FLU/RSV plus assay is intended as an aid in the diagnosis of influenza from Nasopharyngeal swab specimens and should not be used as a sole basis for treatment. Nasal washings and aspirates are unacceptable for Xpert Xpress  SARS-CoV-2/FLU/RSV testing.  Fact Sheet for Patients: EntrepreneurPulse.com.au  Fact Sheet for Healthcare Providers: IncredibleEmployment.be  This test is not yet approved or cleared by the [place] FDA and has been authorized for detection and/or diagnosis of SARS-CoV-2 by FDA under an Emergency Use Authorization (EUA). This EUA will remain in effect (meaning this test can be used) for the duration of the COVID-19 declaration under Section 564(b)(1) of the Act, 21 U.S.C. section 360bbb-3(b)(1), unless the authorization is terminated or revoked.  Performed at [facility], [place] [address] [place], [place] [address]       Radiology Studies: No results found.  Bonnielee Haff 02/05/2021  [facility]

## 2021-02-05 NOTE — Progress Notes (Signed)
Occupational Therapy Treatment Patient Details Name: [name] MRN: [id number] DOB: 1950-07-19 Today's Date: 02/05/2021   History of present illness 70 yo female admitted 01/23/21 after syncope at HD. 11/25 pt with HTN and seizure with LTM EEG. Pt with PRES. 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 PMhx: HTN, Aflutter, pulmonary HTN, HIV, GERD, chronic pain, ESRD on HD TTS.   OT comments  Pt progressing well towards goals, motivated to walk in room today despite reports of R LE pain. Pt overall able to mobilize in room via RW and Min A with one seated rest break. Pt noted with coordination and strength deficits of R UE, requiring assist to hold to RW effectively. Pt pleasant and follows all directions consistently. Collaborated with pt and RN on decreased appetite and food types that pt interested in. Plan to progress standing tolerance and balance with ADLs in next sessions. DC recs remain appropriate.    Recommendations for follow up therapy are one component of a multi-disciplinary discharge planning process, led by the attending physician.  Recommendations may be updated based on patient status, additional functional criteria and insurance authorization.    Follow Up Recommendations  Skilled nursing-short term rehab (<3 hours/day)    Assistance Recommended at Discharge Frequent or constant Supervision/Assistance  Equipment Recommendations  None recommended by OT    Recommendations for Other Services      Precautions / Restrictions Precautions Precautions: Fall Restrictions Weight Bearing Restrictions: No       Mobility Bed Mobility Overal bed mobility: Needs Assistance Bed Mobility: Supine to Sit;Sit to Supine     Supine to sit: Mod assist;HOB elevated Sit to supine: Mod assist   General bed mobility comments: Assist to lift trunk and get LE fully off bed, assist to get BLE back into bed and guide trunk    Transfers Overall transfer  level: Needs assistance Equipment used: Rolling walker (2 wheels) Transfers: Sit to/from Stand Sit to Stand: Min assist           General transfer comment: Min A to rise from bedside with RW, increased time to gain balance and assist R UE to RW. and chair w/o armrests     Balance Overall balance assessment: Needs assistance Sitting-balance support: Feet supported Sitting balance-Leahy Scale: Fair     Standing balance support: Bilateral upper extremity supported Standing balance-Leahy Scale: Poor Standing balance comment: reliant on UE support + external support with dynamic tasks                           ADL either performed or assessed with clinical judgement   ADL Overall ADL's : Needs assistance/impaired Eating/Feeding: Set up;Sitting Eating/Feeding Details (indicate cue type and reason): discussed pt decreased appetite and what she may like to eat (emphasized by MD/RN) - reports liking eggs with cheese and mayo                                 Functional mobility during ADLs: Minimal assistance;Rolling walker (2 wheels) General ADL Comments: Pt motivated to get up and walk as she had not been feeling well in previous sessions. Pt able to walk in room to door and back with RW, sit at sink for rest break - first time seeing self in mirror    Extremity/Trunk Assessment Upper Extremity Assessment Upper Extremity Assessment: RUE deficits/detail RUE Deficits / Details: bandaged around  elbow, difficulty fully lifting arm, weakness in wrist and grip strength RUE Sensation: decreased light touch RUE Coordination: decreased gross motor;decreased fine motor   Lower Extremity Assessment Lower Extremity Assessment: Defer to PT evaluation        Vision   Vision Assessment?: No apparent visual deficits   Perception     Praxis      Cognition Arousal/Alertness: Awake/alert Behavior During Therapy: Flat affect Overall Cognitive Status:  Impaired/Different from baseline Area of Impairment: Memory;Attention;Following commands;Safety/judgement;Problem solving;Awareness                   Current Attention Level: Selective Memory: Decreased short-term memory Following Commands: Follows one step commands with increased time Safety/Judgement: Decreased awareness of safety;Decreased awareness of deficits Awareness: Emergent Problem Solving: Slow processing;Decreased initiation;Difficulty sequencing;Requires verbal cues General Comments: pleasant, flat affect, reports recognizing staff in ED, aware of decreased appetite          Exercises     Shoulder Instructions       General Comments denies dizziness or lightheadedness with activity, HR 89    Pertinent Vitals/ Pain       Pain Assessment: Faces Faces Pain Scale: Hurts little more Pain Location: R thigh, feels bandage is tight Pain Descriptors / Indicators: Burning;Grimacing;Guarding Pain Intervention(s): Monitored during session  Home Living                                          Prior Functioning/Environment              Frequency  Min 2X/week        Progress Toward Goals  OT Goals(current goals can now be found in the care plan section)  Progress towards OT goals: Progressing toward goals  Acute Rehab OT Goals Patient Stated Goal: rewrap leg bandage not as tight OT Goal Formulation: With patient Time For Goal Achievement: 02/13/21 Potential to Achieve Goals: Good ADL Goals Pt Will Perform Eating: with set-up;sitting Pt Will Perform Grooming: with set-up;sitting Pt Will Perform Upper Body Bathing: with set-up;sitting Pt Will Perform Upper Body Dressing: with min assist;sitting Pt Will Transfer to Toilet: with min assist;stand pivot transfer;squat pivot transfer;bedside commode Pt Will Perform Toileting - Clothing Manipulation and hygiene: with mod assist;sitting/lateral leans Pt/caregiver will Perform Home Exercise  Program: Increased strength;Both right and left upper extremity;With written HEP provided;With Supervision Additional ADL Goal #1: Pt will complete bed mobility with min assist and maintain dynamic sitting balance with supervision for 5 minutes as precursor to ADLs. Additional ADL Goal #2: Pt will follow 1 step commands with 90% accuracy and increased time.  Plan Discharge plan remains appropriate    Co-evaluation                 AM-PAC OT "6 Clicks" Daily Activity     Outcome Measure   Help from another person eating meals?: A Little Help from another person taking care of personal grooming?: A Little Help from another person toileting, which includes using toliet, bedpan, or urinal?: A Lot Help from another person bathing (including washing, rinsing, drying)?: A Lot Help from another person to put on and taking off regular upper body clothing?: A Little Help from another person to put on and taking off regular lower body clothing?: A Lot 6 Click Score: 15    End of Session Equipment Utilized During Treatment: Gait belt;Rolling walker (2 wheels)  OT  Visit Diagnosis: Other abnormalities of gait and mobility (R26.89);Muscle weakness (generalized) (M62.81);Other symptoms and signs involving cognitive function Pain - Right/Left: Right Pain - part of body: Leg   Activity Tolerance Patient tolerated treatment well   Patient Left in bed;with call bell/phone within reach;with bed alarm set;Other (comment) (vascular PA at bedside to change dressing)   Nurse Communication Mobility status        Time: 8483-5075 OT Time Calculation (min): 22 min  Charges: OT General Charges $OT Visit: 1 Visit OT Treatments $Therapeutic Activity: 8-22 mins  Malachy Chamber, OTR/L Acute Rehab Services Office: [phone]   Layla Maw 02/05/2021, 7:43 AM

## 2021-02-05 NOTE — Progress Notes (Signed)
Pharmacy Antibiotic Note Pharmacy Consult for vancomycin  Indication:  MRSA infection of AV fistula    Height: 5\' 2"  (157.5 cm) Weight: 41.5 kg (91 lb 7.9 oz) IBW/kg (Calculated) : 50.1  Temp (24hrs), Avg:98.3 F (36.8 C), Min:97.7 F (36.5 C), Max:99 F (37.2 C)  Recent Labs  Lab 01/30/21 1749 01/31/21 0246 02/01/21 0608 02/03/21 0952 02/03/21 1538 02/03/21 2131 02/04/21 0332 02/04/21 1800 02/05/21 0151  WBC  --  4.3 4.1  --  11.1* 7.6 6.8 6.2 7.0  CREATININE 5.41* 2.02* 3.88* 4.70*  --   --  5.93*  --   --   VANCORANDOM  --   --   --   --   --   --  16  --   --      Estimated Creatinine Clearance: 6 mL/min (A) (by C-G formula based on SCr of 5.93 mg/dL (H)).    Allergies  Allergen Reactions   Lactose Intolerance (Gi) Other (See Comments)    On MAR   Latex Rash and Itching   Penicillins Rash and Swelling    Has patient had a PCN reaction causing immediate rash, facial/tongue/throat swelling, SOB or lightheadedness with hypotension: No Has patient had a PCN reaction causing severe rash involving mucus membranes or skin necrosis: No Has patient had a PCN reaction that required hospitalization No Has patient had a PCN reaction occurring within the last 10 years: No If all of the above answers are "NO", then may proceed with Cephalosporin use.      Assessment:  70yo female admitted on 01/23/2021 with MRSA infection of right AV fistula on vancomycin PTA with plan to continue to 12/14 (started 10/28, for 6 weeks).  Patient gets dialysis TTSa but has received inconsistent outpatient antibiotic therapy due to missing hemodialysis or ending session early.  Pharmacy was consulted on 01/27/21 for vancomycin dosing.  Continues on Vancomycin 500 mg IV TTS with HD.   Continues on scheduled TTS HD, currently in HD for dialysis now.  Pre- HD vancomycin level today 02/04/21 is 16 mcg/ml, therapeutic pre HD level.  Goal =15-25 mcg/ml.   Patient signed off of HD early 12/6 and  vancomycin dose was not given.  However, pre-HD level only 16 -so the ~2 hrs of HD she did receive were likely enough to drop level < 15.    Antimicrobials this admission: Vancomycin 10/28  >> 12/14    Vanc levels/ Dose adjustments this admission: 11/28 Vanc Random level =18 (on vancomycin 500 mg with TTSa  12/6  Vanc Random level = 16 mcg/ml on vanc 500mg  qHD-TTSa.  Microbiology results: 11/25 BCx x2: negative 11/26 MRSA PCR: neg  11/26 CSF cx: negative 10/28 Arm abscess + MRSA  12/5 SARS Covid PCR: neg: flu A/B: neg  Goal of Therapy:  Pre HD vancomycin random level = 15-25 mcg/ml   Plan:  Will give extra dose of vancomycin 500 mg IV x 1 now. Then continue vancomycin 500 mg IV q TTS after HD. End date for IV Vanc planned 02/12/21, last dose will be given with HD next Tues on 02/11/21 , thus no further levels planned.  [place] Crane, Roylene Reason, BCCP Clinical Pharmacist  02/05/2021 1:05 PM   [facility] pharmacy phone numbers are listed on [email or web address]

## 2021-02-06 DIAGNOSIS — G9340 Encephalopathy, unspecified: Secondary | ICD-10-CM | POA: Diagnosis not present

## 2021-02-06 DIAGNOSIS — I10 Essential (primary) hypertension: Secondary | ICD-10-CM | POA: Diagnosis not present

## 2021-02-06 DIAGNOSIS — N186 End stage renal disease: Secondary | ICD-10-CM | POA: Diagnosis not present

## 2021-02-06 DIAGNOSIS — I4811 Longstanding persistent atrial fibrillation: Secondary | ICD-10-CM | POA: Diagnosis not present

## 2021-02-06 LAB — RENAL FUNCTION PANEL
Albumin: 2.5 g/dL — ABNORMAL LOW (ref 3.5–5.0)
Anion gap: 11 (ref 5–15)
BUN: 29 mg/dL — ABNORMAL HIGH (ref 8–23)
CO2: 24 mmol/L (ref 22–32)
Calcium: 7.7 mg/dL — ABNORMAL LOW (ref 8.9–10.3)
Chloride: 97 mmol/L — ABNORMAL LOW (ref 98–111)
Creatinine, Ser: 6.26 mg/dL — ABNORMAL HIGH (ref 0.44–1.00)
GFR, Estimated: 7 mL/min — ABNORMAL LOW (ref >60)
Glucose, Bld: 58 mg/dL — ABNORMAL LOW (ref 70–99)
Phosphorus: 3.4 mg/dL (ref 2.5–4.6)
Potassium: 5.2 mmol/L — ABNORMAL HIGH (ref 3.5–5.1)
Sodium: 132 mmol/L — ABNORMAL LOW (ref 135–145)

## 2021-02-06 LAB — CBC
HCT: 26.3 % — ABNORMAL LOW (ref 36.0–46.0)
Hemoglobin: 8.3 g/dL — ABNORMAL LOW (ref 12.0–15.0)
MCH: 29.6 pg (ref 26.0–34.0)
MCHC: 31.6 g/dL (ref 30.0–36.0)
MCV: 93.9 fL (ref 80.0–100.0)
Platelets: 135 10*3/uL — ABNORMAL LOW (ref 150–400)
RBC: 2.8 MIL/uL — ABNORMAL LOW (ref 3.87–5.11)
RDW: 21.9 % — ABNORMAL HIGH (ref 11.5–15.5)
WBC: 8.6 10*3/uL (ref 4.0–10.5)
nRBC: 0 % (ref 0.0–0.2)

## 2021-02-06 LAB — GLUCOSE, CAPILLARY
Glucose-Capillary: 97 mg/dL (ref 70–99)
Glucose-Capillary: 54 mg/dL — ABNORMAL LOW (ref 70–99)
Glucose-Capillary: 187 mg/dL — ABNORMAL HIGH (ref 70–99)
Glucose-Capillary: 78 mg/dL (ref 70–99)
Glucose-Capillary: 128 mg/dL — ABNORMAL HIGH (ref 70–99)
Glucose-Capillary: 143 mg/dL — ABNORMAL HIGH (ref 70–99)

## 2021-02-06 MED ORDER — METOPROLOL TARTRATE 25 MG PO TABS
25.0000 mg | ORAL_TABLET | Freq: Two times a day (BID) | ORAL | Status: DC
  Administered 2021-02-06 – 2021-02-10 (×6): 25 mg via ORAL
  Filled 2021-02-06 (×7): qty 1

## 2021-02-06 MED ORDER — HEPARIN SODIUM (PORCINE) 1000 UNIT/ML IJ SOLN
3800.0000 [IU] | INTRAMUSCULAR | Status: DC | PRN
  Administered 2021-02-08: 3800 [IU]
  Filled 2021-02-06: qty 4
  Filled 2021-02-06 (×2): qty 3.8

## 2021-02-06 MED ORDER — HEPARIN SODIUM (PORCINE) 1000 UNIT/ML IJ SOLN
INTRAMUSCULAR | Status: AC
  Administered 2021-02-06: 3800 [IU]
  Filled 2021-02-06: qty 4

## 2021-02-06 MED ORDER — DEXTROSE 50 % IV SOLN
INTRAVENOUS | Status: AC
  Administered 2021-02-06: 25 mL
  Filled 2021-02-06: qty 50

## 2021-02-06 NOTE — Progress Notes (Addendum)
[facility]  Subjective  - No new complaints.   Objective (!) 149/83 82 98.6 F (37 C) (Oral) 18 95%  Intake/Output Summary (Last 24 hours) at 02/06/2021 1347 Last data filed at 02/06/2021 1311 Gross per 24 hour  Intake 710.04 ml  Output 913 ml  Net -202.96 ml    Right UE wound vac placed over myriad, and open wound proximal 7 x 10 cm myriad matrix  Palpable radial pulse motor grossly intact right UE Right vein harvest site healing well, minimal bloody drainage, dry dressing replaced. Lungs non labored breathing General A & O    Assessment/Planning: POD # 3 status post right brachial brachial bypass with right greater saphenous vein for bleeding ruptured pseudoaneurysm.  a plug of 7 x 10 cm myriad matrix was inserted into the brachial arteriotomy  Right UE well perfused.  Wound vac will be changed Monday Right thigh dressing changed daily Eliquis and Plavix have been restarted Stable disposition  Roxy Horseman 02/06/2021 1:47 PM --  Laboratory Lab Results: Recent Labs    02/05/21 0151 02/06/21 0125  WBC 7.0 8.6  HGB 9.3* 8.3*  HCT 28.9* 26.3*  PLT 125* 135*   BMET Recent Labs    02/04/21 0332 02/06/21 0125  NA 132* 132*  K 5.9* 5.2*  CL 100 97*  CO2 22 24  GLUCOSE 79 58*  BUN 28* 29*  CREATININE 5.93* 6.26*  CALCIUM 8.5* 7.7*    COAG Lab Results  Component Value Date   INR 1.2 01/24/2021   INR 1.14 12/28/2011   INR 1.10 12/13/2010   No results found for: PTT  VASCULAR STAFF ADDENDUM: I have independently interviewed and examined the patient. I agree with the above.  VAC placed at bedside.  2+ R radial pulse. OK for Eliquis. No need for Plavix from my standpoint. PT/OT/OOB Disposition planning.  [name]. Stanford Breed, MD [facility] Phone Number: [phone] 02/06/2021 2:21 PM

## 2021-02-06 NOTE — Progress Notes (Signed)
   02/06/21 1311  Vitals  Temp 98.6 F (37 C)  Temp Source Oral  BP (!) 149/83  BP Location Left Leg  BP Method Automatic  Patient Position (if appropriate) Lying  Pulse Rate 82  Pulse Rate Source Monitor  Resp 18  Oxygen Therapy  SpO2 95 %  O2 Device Room Air  Pain Assessment  Pain Scale 0-10  Pain Score 0  Dialysis Weight  Weight 38.7 kg  Type of Weight Post-Dialysis  Post-Hemodialysis Assessment  Rinseback Volume (mL) 250 mL  KECN 258 V  Dialyzer Clearance Lightly streaked  Duration of HD Treatment -hour(s) 3.5 hour(s)  Hemodialysis Intake (mL) 600 mL  UF Total -Machine (mL) 1513 mL  Net UF (mL) 913 mL  Tolerated HD Treatment Yes  Post-Hemodialysis Comments tx complete-pt stable  HD tx complete, pt stable. No problems noted throughout tx.

## 2021-02-06 NOTE — Progress Notes (Signed)
[facility] NEPHROLOGY PROGRESS NOTE  Assessment/ Plan:  Outpatient HD orders:[name] TTS,EDW 41.5 kg,Bath 2K/2.5 ca,BF 350  DF 500,Time - 180 minutes Access catheter  Medications - out of hectorol and now on calcitriol - 1.25 mcg each tx. Sensipar 90 mg three times a week; Epogen 3600 units each tx, Heparin 1000 units bolus then 500 units per hour Has been on vanc 500 mg each tx (though misses often)   # Acute encephalopathy: EEG with moderate diffuse encephalopathy of nonspecific etiology.  Based on neurology evaluation appears to be consistent with PRES along with imaging showing no evidence of acute CVA.  Seen by neurology.  Mental status seems seems to be stable.  # ESRD: She is on outpatient TTS schedule. HD today, next HD 12/10.  Left IJ TDC for access.  # Anemia:  continue ESA with HD. Transfuse prn  # CKD-MBD: Resumed  calcitriol and Sensipar for PTH control.  Started calcium acetate for hyperphosphatemia and the repeat lab looks better.  Continue to monitor.  #  Right radiocephalic fistula infection: Status post ligation and debridement on 10/28 with inconsistent outpatient antibiotic therapy due to missing hemodialysis or signing off early.  Restarted back on antibiotic therapy with vancomycin.  #Ruptured pseudoaneurysm -taken to OR urgently on 12/5 for re-do and rt brachial brachial bypass. Appreciate VVS assistance  # Hypertension:  She is currently on amlodipine, hydralazine and metoprolol dose was increased yesterday.  Volume status acceptable.  Continue to monitor blood pressure, UF as tolerated with HD.    #Hypokalemia complicated by diarrhea.  Increased potassium bath during dialysis initially. Hyperkalemic yesterday--likely related to blood products? Will get back to a 2k bath with HD  Subjective: Seen and examined bedside this AM. No acute events. She reports that's her vomiting and diarrhea are slightly better today, receiving D50 for hypoglycemia  this AM per RN. Currently eating breakfast. Open for HD later today Objective Vital signs in last 24 hours: Vitals:   02/05/21 2007 02/05/21 2353 02/06/21 0410 02/06/21 0822  BP: 139/78 (!) 148/85 (!) 143/74 (!) 145/80  Pulse: 82 75 73   Resp: 20 17 12 18   Temp: 98.6 F (37 C) 98.6 F (37 C) 98.1 F (36.7 C) 98.9 F (37.2 C)  TempSrc: Oral Oral Axillary Oral  SpO2: 99% 100% 94%   Weight:   41.3 kg   Height:       Weight change: 0.8 kg  Intake/Output Summary (Last 24 hours) at 02/06/2021 0858 Last data filed at 02/06/2021 0837 Gross per 24 hour  Intake 1070.04 ml  Output 0 ml  Net 1070.04 ml       Labs: Basic Metabolic Panel: Recent Labs  Lab 01/30/21 1749 01/31/21 0246 02/01/21 0608 02/03/21 0952 02/04/21 0332 02/06/21 0125  NA 128*   < > 134* 135 132* 132*  K 4.2   < > 3.8 4.2 5.9* 5.2*  CL 93*   < > 101 101 100 97*  CO2 24   < > 24  --  22 24  GLUCOSE 92   < > 94 97 79 58*  BUN 31*   < > 15 18 28* 29*  CREATININE 5.41*   < > 3.88* 4.70* 5.93* 6.26*  CALCIUM 7.7*   < > 9.1  --  8.5* 7.7*  PHOS 3.2  --   --   --  4.3 3.4   < > = values in this interval not displayed.   Liver Function Tests: Recent Labs  Lab 01/31/21 0246 02/01/21 0608 02/04/21 0332 02/06/21 0125  AST 25 38  --   --   ALT 15 26  --   --   ALKPHOS 73 67  --   --   BILITOT 0.3 0.2*  --   --   PROT 7.1 7.0  --   --   ALBUMIN 2.7* 2.7* 2.4* 2.5*   No results for input(s): LIPASE, AMYLASE in the last 168 hours. No results for input(s): AMMONIA in the last 168 hours.  CBC: Recent Labs  Lab 02/03/21 2131 02/04/21 0332 02/04/21 1800 02/05/21 0151 02/06/21 0125  WBC 7.6 6.8 6.2 7.0 8.6  HGB 8.1* 6.4* 9.1* 9.3* 8.3*  HCT 26.5* 20.6* 27.7* 28.9* 26.3*  MCV 101.9* 101.5* 90.8 92.0 93.9  PLT 172 143* 130* 125* 135*   Cardiac Enzymes: No results for input(s): CKTOTAL, CKMB, CKMBINDEX, TROPONINI in the last 168 hours. CBG: Recent Labs  Lab 02/05/21 2006 02/05/21 2356  02/06/21 0414 02/06/21 0828 02/06/21 0851  GLUCAP 142* 81 97 54* 187*    Iron Studies: No results for input(s): IRON, TIBC, TRANSFERRIN, FERRITIN in the last 72 hours. Studies/Results: No results found.  Medications: Infusions:    Scheduled Medications:  sodium chloride   Intravenous Once   sodium chloride   Intravenous Once   apixaban  2.5 mg Oral BID   calcitRIOL  1.25 mcg Oral Q T,Th,Sa-HD   calcium acetate (Phos Binder)  1,334 mg Oral TID WC   chlorhexidine  15 mL Mouth Rinse BID   Chlorhexidine Gluconate Cloth  6 each Topical Q0600   cinacalcet  90 mg Oral Q T,Th,Sa-HD   darbepoetin (ARANESP) injection - DIALYSIS  40 mcg Intravenous Q Tue-HD   dolutegravir  50 mg Oral q1600   fluconazole  100 mg Oral Daily   lacosamide  150 mg Oral BID   lamiVUDine  50 mg Oral Daily   levETIRAcetam  1,000 mg Oral Daily   levETIRAcetam  250 mg Oral Q T,Th,Sat-1800   lidocaine-prilocaine  1 application Topical Q T,Th,Sa-HD   mouth rinse  15 mL Mouth Rinse q12n4p   metoprolol tartrate  12.5 mg Oral BID   mirtazapine  15 mg Oral QHS   nystatin  5 mL Oral QID   pantoprazole  40 mg Oral QHS   senna  2 tablet Oral QPM   thiamine  100 mg Oral Daily   vancomycin  500 mg Intravenous Q T,Th,Sa-HD   venlafaxine  75 mg Oral BID   zidovudine  100 mg Oral Q8H    have reviewed scheduled and prn medications.  Physical Exam: General: chronically ill appearing Heart:RRR, s1s2 nl Lungs: Clear b/l, no crackle Abdomen:soft, Non-tender, non-distended Extremities:No peripheral edema, rue swelling w/ wound vac in place Dialysis Access: Left IJ TDC in place.  [name] 02/06/2021,8:58 AM  LOS: 14 days

## 2021-02-06 NOTE — Progress Notes (Signed)
SLP Cancellation Note  Patient Details Name: [name] MRN: [id number] DOB: 08-04-1950   Cancelled treatment:       Reason Eval/Treat Not Completed: Patient at procedure or test/unavailable (HD)    Osie Bond., M.A. [facility] Acute Rehabilitation Services Pager [phone] Office [phone]  02/06/2021, 9:47 AM

## 2021-02-06 NOTE — Progress Notes (Signed)
02/06/21 Pt CBG=54 at 0828 encouraged pt to eat and drink juice.  Did not want to rush and make sick. Gave 1/2 amp D50. CBG=187 at 0851. Nephrologist in room at time and made aware. Called [facility] and made her aware. Dr. [place] Pink aware as well. No new orders. Will continue to monitor. Payton Emerald, RN

## 2021-02-06 NOTE — Progress Notes (Signed)
PROGRESS NOTE  [name] [facility]:[id number] DOB: Feb 12, 1951 DOA: 01/23/2021 PCP: Hilbert Corrigan, MD   LOS: 14 days   Brief Narrative / Interim history: 70 year old female who has a history of a flutter on Eliquis, pulmonary hypertension, HIV, GERD, ESRD on TTS HD, fistula infection on vent treatment with HD as an outpatient but not consistently administered due to early termination of her HD, was brought to [facility] on 11/24 due to a syncopal episode while undergoing HD.  She also had intermittent confusion at that time.  After admission, on 11/25 became hypertensive, had a seizure and neurology was consulted.  An MRI of the brain was concerning for press.  She was started on Cardene drip and she was transferred to [facility] for ICU management and continuous EEG.  Her mental status continues to improve.  On 11/28 failed bedside speech eval and core track was placed.  On 11/29 her continuous EEG has been discontinued, and with continued improvement she was transferred to the hospitalist service on 12/1.  Nephrology also following for her dialysis needs.  Subjective: Patient asleep this morning but easily arousable.  Mentions that she continues to feel better.  Did have some nausea yesterday and small amount of emesis but overall she feels better.  Denies any abdominal pain.  No shortness of breath.  Pain in the right arm and right leg is getting better.  Has not noticed any overt bleeding.    Assessment & Plan:  Acute encephalopathy with press Patient had a seizure episode in the setting of hypertension.  MRI showed cortical and subcortical edema in the occipital parietal lobes bilaterally.  She was subsequently admitted to the ICU, improved and transferred to the hospitalist service on 12/1.  Mentation seems to be stable.  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  EEG initially showed nonconvulsive status epilepticus.  She underwent continuous EEG  monitoring.   Currently on Vimpat and Keppra.   Has been seizure-free.    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   ID was consulted and recommending vancomycin with dialysis for total of 6 weeks with a last day 02/12/2021.   There are some reports that she might have missed dialysis and antibiotics however patient denies, and overall this is not clear.  Discussed with nephrology, they are not sure either. Wound has been stable.  Continue vancomycin with hemodialysis.  Pseudoaneurysm rupture right arm fistula Developed bleeding from her fistula on 12/5.  Had to be taken to the OR emergently.  Underwent control of hemorrhage and creation of a right brachial to brachial bypass.  Had a lot of bleeding from her saphenous vein harvesting site in the right lower extremity as well as from the right upper extremity.  This is likely because patient was on Plavix and Eliquis.  These medications were held. Vascular surgery is following and managing the surgical sites..  Cleared to reinitiate Eliquis which was done last night.  Plavix to be discontinued as discussed below.    Essential hypertension/hypertensive emergency Presented with significantly elevated blood pressures with concern for press.  See above.  She was on amlodipine and metoprolol.  However after her bleeding episode from her fistula her blood pressures dropped.  Her amlodipine and metoprolol were discontinued.   Blood pressure trending upwards.  Metoprolol was resumed.  Heart rate is stable.  Consider increasing the dose of metoprolol from tonight.  Peripheral vascular disease/innominate vein stenting Stent in the innominate vein was placed in 2019.  Has had multiple angioplasties since then.  She was placed on Plavix at that time.  Now she is on Eliquis.  Discussed with Dr. Donzetta Matters about Plavix.  He is also reviewed  the data.  She does not have any other indication for being on Plavix.  He recommends discontinuing it since patient is now on Eliquis.  Plavix was discontinued.  Acute blood loss anemia/anemia of chronic disease Drop in hemoglobin was from bleeding from her fistula.  Transfused 2 units of PRBC on 12/6.  Hemoglobin improved.  Came up to 9 and then noted to be 8.3 today.  No overt bleeding has been noted.  We will recheck it tomorrow.  No indication for transfusion at this time.    Hypoglycemia Continues to have low glucose levels likely due to poor oral intake.  Continue to encourage oral intake.  Monitor CBGs.    Nausea and vomiting Possibly related to dialysis as well as other acute issues.  Abdomen remains benign.  Continue to monitor.  We will consider imaging studies if she continues to have the symptoms.  Thrombocytopenia Stable.    Paroxysmal atrial fibrillation with rapid ventricular response She was noted to be bradycardic on 12/5-6.  Her metoprolol had to be held.  Heart rate appears to have improved.  Metoprolol resumed at a lower dose.  Will increase the dose tonight Eliquis was placed on hold due to bleeding.   Eliquis resumed last evening.  No overt bleeding noted currently.  ESRD, on HD.  Being dialyzed on TTS schedule.  Nephrology is following.   HIV/oral thrush On antiretrovirals.  Continue Diflucan.   Severe protein calorie malnutrition  Cleared for oral intake by speech therapy.  Was on core track tube feeding.  Needs encouragement with oral intake.  GERD -PPI   Hyponatremia-volume managed with dialysis   DVT prophylaxis: Continue Eliquis CODE STATUS: DNR Family communication: No family at bedside Disposition: Skilled nursing facility when stable  Status is: Inpatient  Remains inpatient appropriate because: Encephalopathy, seizure   Scheduled Meds:  sodium chloride   Intravenous Once   sodium chloride   Intravenous Once   apixaban  2.5 mg Oral BID    calcitRIOL  1.25 mcg Oral Q T,Th,Sa-HD   calcium acetate (Phos Binder)  1,334 mg Oral TID WC   chlorhexidine  15 mL Mouth Rinse BID   Chlorhexidine Gluconate Cloth  6 each Topical Q0600   cinacalcet  90 mg Oral Q T,Th,Sa-HD   darbepoetin (ARANESP) injection - DIALYSIS  40 mcg Intravenous Q Tue-HD   dolutegravir  50 mg Oral q1600   fluconazole  100 mg Oral Daily   lacosamide  150 mg Oral BID   lamiVUDine  50 mg Oral Daily   levETIRAcetam  1,000 mg Oral Daily   levETIRAcetam  250 mg Oral Q T,Th,Sat-1800   lidocaine-prilocaine  1 application Topical Q T,Th,Sa-HD   mouth rinse  15 mL Mouth Rinse q12n4p   metoprolol tartrate  12.5 mg Oral BID   mirtazapine  15 mg Oral QHS   nystatin  5 mL Oral QID   pantoprazole  40 mg Oral QHS   senna  2 tablet Oral QPM   thiamine  100 mg Oral Daily   vancomycin  500 mg Intravenous Q T,Th,Sa-HD   venlafaxine  75 mg Oral BID   zidovudine  100 mg Oral  Q8H   Continuous Infusions:   PRN Meds:.acetaminophen **OR** acetaminophen, hydrALAZINE, HYDROmorphone (DILAUDID) injection, lanolin/mineral oil, lidocaine, loperamide, LORazepam, ondansetron **OR** ondansetron (ZOFRAN) IV, ondansetron, oxyCODONE-acetaminophen, polyethylene glycol  Diet Orders (From admission, onward)     Start     Ordered   02/03/21 1600  Diet regular Room service appropriate? Yes with Assist; Fluid consistency: Thin  Diet effective now       Question Answer Comment  Room service appropriate? Yes with Assist   Fluid consistency: Thin      02/03/21 1600               Consultants:  PCCM Nephrology  Procedures:  As above  Microbiology  none  Antimicrobials: Vancomycin    Objective: Vitals:   02/05/21 2007 02/05/21 2353 02/06/21 0410 02/06/21 0822  BP: 139/78 (!) 148/85 (!) 143/74 (!) 145/80  Pulse: 82 75 73   Resp: 20 17 12 18   Temp: 98.6 F (37 C) 98.6 F (37 C) 98.1 F (36.7 C) 98.9 F (37.2 C)  TempSrc: Oral Oral Axillary Oral  SpO2: 99% 100% 94%    Weight:   41.3 kg   Height:        Intake/Output Summary (Last 24 hours) at 02/06/2021 0920 Last data filed at 02/06/2021 0837 Gross per 24 hour  Intake 1070.04 ml  Output 0 ml  Net 1070.04 ml    Filed Weights   02/04/21 0928 02/05/21 0406 02/06/21 0410  Weight: 40.5 kg 41.5 kg 41.3 kg    Examination:  General appearance: Awake alert.  In no distress Resp: Clear to auscultation bilaterally.  Normal effort Cardio: S1-S2 is normal regular.  No S3-S4.  No rubs murmurs or bruit GI: Abdomen is soft.  Nontender nondistended.  Bowel sounds are present normal.  No masses organomegaly Extremities: Right arm and right leg covered in dressing.  No overt bleeding noted.  Good pulses Neurologic:  No focal neurological deficits.    Data Reviewed: I have independently reviewed following labs and imaging studies   CBC: Recent Labs  Lab 02/03/21 2131 02/04/21 0332 02/04/21 1800 02/05/21 0151 02/06/21 0125  WBC 7.6 6.8 6.2 7.0 8.6  HGB 8.1* 6.4* 9.1* 9.3* 8.3*  HCT 26.5* 20.6* 27.7* 28.9* 26.3*  MCV 101.9* 101.5* 90.8 92.0 93.9  PLT 172 143* 130* 125* 135*    Basic Metabolic Panel: Recent Labs  Lab 01/30/21 1749 01/31/21 0246 01/31/21 2030 02/01/21 0608 02/03/21 0952 02/04/21 0332 02/06/21 0125  NA 128* 133*  --  134* 135 132* 132*  K 4.2 3.2* 3.4* 3.8 4.2 5.9* 5.2*  CL 93* 100  --  101 101 100 97*  CO2 24 26  --  24  --  22 24  GLUCOSE 92 102*  --  94 97 79 58*  BUN 31* 5*  --  15 18 28* 29*  CREATININE 5.41* 2.02*  --  3.88* 4.70* 5.93* 6.26*  CALCIUM 7.7* 8.5*  --  9.1  --  8.5* 7.7*  MG  --   --  1.9  --   --   --   --   PHOS 3.2  --   --   --   --  4.3 3.4    Liver Function Tests: Recent Labs  Lab 01/30/21 1749 01/31/21 0246 02/01/21 0608 02/04/21 0332 02/06/21 0125  AST  --  25 38  --   --   ALT  --  15 26  --   --   ALKPHOS  --  73 67  --   --   BILITOT  --  0.3 0.2*  --   --   PROT  --  7.1 7.0  --   --   ALBUMIN 2.5* 2.7* 2.7* 2.4* 2.5*     CBG: Recent Labs  Lab 02/05/21 2006 02/05/21 2356 02/06/21 0414 02/06/21 0828 02/06/21 0851  GLUCAP 142* 81 97 54* 187*     Recent Results (from the past 240 hour(s))  Resp Panel by RT-PCR (Flu A&B, Covid) Nasopharyngeal Swab     Status: None   Collection Time: 02/03/21  5:59 PM   Specimen: Nasopharyngeal Swab; Nasopharyngeal(NP) swabs in vial transport medium  Result Value Ref Range Status   SARS Coronavirus 2 by RT PCR NEGATIVE NEGATIVE Final    Comment: (NOTE) SARS-CoV-2 target nucleic acids are NOT DETECTED.  The SARS-CoV-2 RNA is generally detectable in upper respiratory specimens during the acute phase of infection. The lowest concentration of SARS-CoV-2 viral copies this assay can detect is 138 copies/mL. A negative result does not preclude SARS-Cov-2 infection and should not be used as the sole basis for treatment or other patient management decisions. A negative result may occur with  improper specimen collection/handling, submission of specimen other than nasopharyngeal swab, presence of viral mutation(s) within the areas targeted by this assay, and inadequate number of viral copies(<138 copies/mL). A negative result must be combined with clinical observations, patient history, and epidemiological information. The expected result is Negative.  Fact Sheet for Patients:  EntrepreneurPulse.com.au  Fact Sheet for Healthcare Providers:  IncredibleEmployment.be  This test is no t yet approved or cleared by the [place] FDA and  has been authorized for detection and/or diagnosis of SARS-CoV-2 by FDA under an Emergency Use Authorization (EUA). This EUA will remain  in effect (meaning this test can be used) for the duration of the COVID-19 declaration under Section 564(b)(1) of the Act, 21 U.S.C.section 360bbb-3(b)(1), unless the authorization is terminated  or revoked sooner.       Influenza A by PCR NEGATIVE NEGATIVE  Final   Influenza B by PCR NEGATIVE NEGATIVE Final    Comment: (NOTE) The Xpert Xpress SARS-CoV-2/FLU/RSV plus assay is intended as an aid in the diagnosis of influenza from Nasopharyngeal swab specimens and should not be used as a sole basis for treatment. Nasal washings and aspirates are unacceptable for Xpert Xpress SARS-CoV-2/FLU/RSV testing.  Fact Sheet for Patients: EntrepreneurPulse.com.au  Fact Sheet for Healthcare Providers: IncredibleEmployment.be  This test is not yet approved or cleared by the [place] FDA and has been authorized for detection and/or diagnosis of SARS-CoV-2 by FDA under an Emergency Use Authorization (EUA). This EUA will remain in effect (meaning this test can be used) for the duration of the COVID-19 declaration under Section 564(b)(1) of the Act, 21 U.S.C. section 360bbb-3(b)(1), unless the authorization is terminated or revoked.  Performed at [facility], [place] [address] [place], [place] [address]       Radiology Studies: No results found.  Bonnielee Haff 02/06/2021  [facility]

## 2021-02-07 DIAGNOSIS — D62 Acute posthemorrhagic anemia: Secondary | ICD-10-CM | POA: Diagnosis not present

## 2021-02-07 DIAGNOSIS — A4902 Methicillin resistant Staphylococcus aureus infection, unspecified site: Secondary | ICD-10-CM | POA: Diagnosis not present

## 2021-02-07 DIAGNOSIS — I4811 Longstanding persistent atrial fibrillation: Secondary | ICD-10-CM | POA: Diagnosis not present

## 2021-02-07 LAB — CBC
HCT: 25.3 % — ABNORMAL LOW (ref 36.0–46.0)
Hemoglobin: 7.9 g/dL — ABNORMAL LOW (ref 12.0–15.0)
MCH: 29.5 pg (ref 26.0–34.0)
MCHC: 31.2 g/dL (ref 30.0–36.0)
MCV: 94.4 fL (ref 80.0–100.0)
Platelets: 137 10*3/uL — ABNORMAL LOW (ref 150–400)
RBC: 2.68 MIL/uL — ABNORMAL LOW (ref 3.87–5.11)
RDW: 21.5 % — ABNORMAL HIGH (ref 11.5–15.5)
WBC: 7.8 10*3/uL (ref 4.0–10.5)
nRBC: 0 % (ref 0.0–0.2)

## 2021-02-07 LAB — GLUCOSE, CAPILLARY
Glucose-Capillary: 113 mg/dL — ABNORMAL HIGH (ref 70–99)
Glucose-Capillary: 158 mg/dL — ABNORMAL HIGH (ref 70–99)
Glucose-Capillary: 72 mg/dL (ref 70–99)
Glucose-Capillary: 86 mg/dL (ref 70–99)
Glucose-Capillary: 95 mg/dL (ref 70–99)
Glucose-Capillary: 96 mg/dL (ref 70–99)

## 2021-02-07 MED ORDER — CHLORHEXIDINE GLUCONATE CLOTH 2 % EX PADS
6.0000 | MEDICATED_PAD | Freq: Every day | CUTANEOUS | Status: DC
  Administered 2021-02-07 – 2021-02-10 (×4): 6 via TOPICAL

## 2021-02-07 MED ORDER — CINACALCET HCL 30 MG PO TABS
60.0000 mg | ORAL_TABLET | ORAL | Status: DC
  Administered 2021-02-08: 60 mg via ORAL
  Filled 2021-02-07: qty 2

## 2021-02-07 MED ORDER — OXYCODONE-ACETAMINOPHEN 5-325 MG PO TABS
1.0000 | ORAL_TABLET | ORAL | Status: DC | PRN
  Administered 2021-02-07: 2 via ORAL
  Administered 2021-02-08: 1 via ORAL
  Administered 2021-02-08: 2 via ORAL
  Filled 2021-02-07: qty 1
  Filled 2021-02-07 (×2): qty 2

## 2021-02-07 MED ORDER — AMLODIPINE BESYLATE 10 MG PO TABS
10.0000 mg | ORAL_TABLET | Freq: Every day | ORAL | Status: DC
  Administered 2021-02-09 – 2021-02-10 (×2): 10 mg via ORAL
  Filled 2021-02-07 (×3): qty 1

## 2021-02-07 MED ORDER — AMLODIPINE BESYLATE 5 MG PO TABS
5.0000 mg | ORAL_TABLET | Freq: Every day | ORAL | Status: DC
  Administered 2021-02-07: 5 mg via ORAL
  Filled 2021-02-07: qty 1

## 2021-02-07 NOTE — Progress Notes (Addendum)
Occupational Therapy Treatment Patient Details Name: [name] MRN: [id number] DOB: 06/29/1950 Today's Date: 02/07/2021   History of present illness 70 yo female admitted 01/23/21 after syncope at HD. 11/25 pt with HTN and seizure with LTM EEG. Pt with PRES. 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 PMhx: HTN, Aflutter, pulmonary HTN, HIV, GERD, chronic pain, ESRD on HD TTS.   OT comments  Assessed RUE. Pt with limited elbow/wrist/hand ROM as noted below. ROM improves after prolonged passive stretch and A/AAROM. Will order R wrist cock-up splint to improve functional position and use of dominant R hand. Will  plan to have pt initially wear splint 2 hr on/2 hr off and adjust wearing schedule as needed. Will issue squeeze ball for pt to use frequently throughout the day. Encourage R elbow ROM with prolonged passive stretch into extension as tolerated. Discussed plan with nsg. OT to see again today. Will continue to follow acutely.   Recommendations for follow up therapy are one component of a multi-disciplinary discharge planning process, led by the attending physician.  Recommendations may be updated based on patient status, additional functional criteria and insurance authorization.    Follow Up Recommendations  Skilled nursing-short term rehab (<3 hours/day)    Assistance Recommended at Discharge Frequent or constant Supervision/Assistance  Equipment Recommendations  None recommended by OT    Recommendations for Other Services      Precautions / Restrictions Precautions Precautions: Fall       Mobility Bed Mobility                    Transfers                         Balance                                           ADL either performed or assessed with clinical judgement   ADL                                              Extremity/Trunk Assessment Upper Extremity Assessment Upper  Extremity Assessment: RUE deficits/detail RUE Deficits / Details: wound vac R forearm close to anitcubital fossa; elobw extensionis limited however after prolonged passive stretch, pt able to achieve functional elbow extension lacking @ 15-20 degrees; significant wrist/hand weakness with pt preferring to hold R wrist in flexion and deviated radially. Able to achieve active wrist extension to neutral, however after passive stretch, wrist extension @ 15 degrees; MPs and IPs tight and painful however able to achieve near full composite grip after passive stretch; only able to oppose thumb to index and middle fingers;will continue to assess            Vision       Perception     Praxis      Cognition Arousal/Alertness: Awake/alert Behavior During Therapy: Flat affect Overall Cognitive Status: Impaired/Different from baseline                                            Exercises General Exercises - Upper Extremity Elbow Extension: AAROM;PROM;Right (  prolonged passive stretch) Wrist Extension: AAROM;PROM;Right (prolonged passive stretch) Digit Composite Flexion: AAROM;PROM;Right;10 reps Composite Extension: AAROM;PROM;Right;10 reps Hand Exercises Forearm Supination: AROM;AAROM;Right;10 reps Forearm Pronation: AROM;AAROM;Right;10 reps Other Exercises Other Exercises: prayer hand stretch hold x 20 seconds   Shoulder Instructions       General Comments      Pertinent Vitals/ Pain       Pain Assessment: Faces Faces Pain Scale: Hurts even more Pain Location: wiht R hand/arm ROM Pain Descriptors / Indicators: Discomfort;Grimacing;Guarding Pain Intervention(s): Limited activity within patient's tolerance;Repositioned  Home Living                                          Prior Functioning/Environment              Frequency  Min 3X/week        Progress Toward Goals  OT Goals(current goals can now be found in the care plan section)   Progress towards OT goals: Progressing toward goals  Acute Rehab OT Goals Patient Stated Goal: to go home OT Goal Formulation: With patient Time For Goal Achievement: 02/13/21 Potential to Achieve Goals: Good ADL Goals Pt Will Perform Eating: with set-up;sitting Pt Will Perform Grooming: with min guard assist;standing Pt Will Perform Upper Body Bathing: with set-up;sitting Pt Will Perform Upper Body Dressing: with min assist;sitting Pt Will Transfer to Toilet: with min assist;stand pivot transfer;squat pivot transfer;bedside commode Pt Will Perform Toileting - Clothing Manipulation and hygiene: with mod assist;sitting/lateral leans Pt/caregiver will Perform Home Exercise Program: Increased strength;Both right and left upper extremity;With written HEP provided;With Supervision Additional ADL Goal #1: Pt will complete bed mobility with min assist and maintain dynamic sitting balance with supervision for 5 minutes as precursor to ADLs. Additional ADL Goal #2: Pt will tolerate R wrist cock-up splint for 2 hours to improve position of R hadn for functional activities  Plan Discharge plan remains appropriate;Frequency needs to be updated    Co-evaluation                 AM-PAC OT "6 Clicks" Daily Activity     Outcome Measure   Help from another person eating meals?: A Little Help from another person taking care of personal grooming?: A Little Help from another person toileting, which includes using toliet, bedpan, or urinal?: A Lot Help from another person bathing (including washing, rinsing, drying)?: A Lot Help from another person to put on and taking off regular upper body clothing?: A Little Help from another person to put on and taking off regular lower body clothing?: A Lot 6 Click Score: 15    End of Session    OT Visit Diagnosis: Other abnormalities of gait and mobility (R26.89);Muscle weakness (generalized) (M62.81);Other symptoms and signs involving cognitive  function;Pain Pain - Right/Left: Right Pain - part of body: Arm;Hand   Activity Tolerance Patient tolerated treatment well   Patient Left in bed;with call bell/phone within reach;with nursing/sitter in room   Nurse Communication Other (comment) (plan for R wrist cock-up splint)        Time: 3614-4315 OT Time Calculation (min): 19 min  Charges: OT General Charges $OT Visit: 1 Visit OT Treatments $Therapeutic Activity: 8-22 mins  Maurie Boettcher, OT/L   Acute OT Clinical Specialist [place] Pager [phone] Office [phone]   [facility] 02/07/2021, 9:47 AM

## 2021-02-07 NOTE — Care Management Important Message (Signed)
Important Message  Patient Details  Name: [name] MRN: [id number] Date of Birth: 06-Aug-1950   Medicare Important Message Given:  Yes     [name] 02/07/2021, 9:51 AM

## 2021-02-07 NOTE — Progress Notes (Signed)
Pharmacy Antibiotic Note  [name] is a 70 y.o. female admitted on 01/23/2021 with  Right AV fistula MRSA infection .  Pharmacy has been consulted for Vanco dosing.   ID: Vanc for PTA Right AV fistula MRSA infection. Plan 6 wks IV per ID -  ligation + debridement 10/28, further I&D 11/1 and 11/8 last admit - TDC > Palindrome conversion 10/31 in IR HIV- doluteg, lamiv, zidovu Note- inconsistent outpatient antibiotic therapy due to missing hemodialysis or signing off early.   Hep B antibody reactive, c/w immunitiy - Tmax 100.3, WBC WNL  Vanc from PTA 10/26>>>(12/14 = 6 weeks per ID) Nystatin oral 12/2 >> Fluconazole 100 mg PO daily 12/4 >>  Levels from last  admit 10/25>>11/10:   10/31: 82 at 1313 (after 2gm pre-op dose while on Vanc > SZP done) - held with HD 11/2   11/3: 44 at 0923 - held dose with HD that day  11/6: 34 at 1239 (after dose given 11/5 after HD)  - HD 11/8  11/9 22 at 0440 - at goal > 500 TTS after HD begun 11/10, then discharged 11/28: VR 18 -12/6: VR is 16, okay to cont same dose vanc 500mg  qHD-TTS.                                                  11/25 Blood: neg 11/26 CSF cx: neg 11/26 LP - high RBC/neutrophils/protein, slight low glucose >> not meningitis 11/26 MRSA PCR negative From last admit:   10/25 BCx: ngtdF  10/28 R arm tissue: MRSA  Plan: Vanc 500 mg TTS post HD til 12/14, then chronic doxycycline (per ID) - 12/14 is 7 weeks from 10/26   Height: 5\' 2"  (157.5 cm) Weight: 38.7 kg (85 lb 5.1 oz) IBW/kg (Calculated) : 50.1  Temp (24hrs), Avg:99.3 F (37.4 C), Min:98.6 F (37 C), Max:100.3 F (37.9 C)  Recent Labs  Lab 02/01/21 0608 02/03/21 0952 02/03/21 1538 02/04/21 0332 02/04/21 1800 02/05/21 0151 02/06/21 0125 02/07/21 0126  WBC 4.1  --    < > 6.8 6.2 7.0 8.6 7.8  CREATININE 3.88* 4.70*  --  5.93*  --   --  6.26*  --   VANCORANDOM  --   --   --  16  --   --   --   --    < > = values in this interval not displayed.     Estimated Creatinine Clearance: 5.3 mL/min (A) (by C-G formula based on SCr of 6.26 mg/dL (H)).    Allergies  Allergen Reactions   Lactose Intolerance (Gi) Other (See Comments)    On MAR   Latex Rash and Itching   Penicillins Rash and Swelling    Has patient had a PCN reaction causing immediate rash, facial/tongue/throat swelling, SOB or lightheadedness with hypotension: No Has patient had a PCN reaction causing severe rash involving mucus membranes or skin necrosis: No Has patient had a PCN reaction that required hospitalization No Has patient had a PCN reaction occurring within the last 10 years: No If all of the above answers are "NO", then may proceed with Cephalosporin use.      Maryn Freelove S. Alford Highland, PharmD, BCPS Clinical Staff Pharmacist [email or web address]  Wayland Salinas 02/07/2021 8:37 AM

## 2021-02-07 NOTE — Progress Notes (Signed)
This chaplain is present for F/U spiritual care. The Pt. is awake and has lunch in front of her. The chaplain observes the Pt. has eaten all of the macaroni and cheese.  The chaplain listens reflectively as the Pt. talks positively about d/c to [facility] and her left arm getting stronger.  The Pt. shares she has a few special friends at [facility].    The Pt. faith remains strong with this admission. The Pt. openly shares the places her faith has been present before praying together.  Chaplain Sallyanne Kuster [phone]

## 2021-02-07 NOTE — Progress Notes (Signed)
Occupational Therapy Treatment Patient Details Name: [name] MRN: [id number] DOB: 1951-02-21 Today's Date: 02/07/2021   History of present illness 70 yo female admitted 01/23/21 after syncope at HD. 11/25 pt with HTN and seizure with LTM EEG. Pt with PRES. 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 PMhx: HTN, Aflutter, pulmonary HTN, HIV, GERD, chronic pain, ESRD on HD TTS.   OT comments  Pt supine in bed.  Reports wearing R wrist cock up splint since received this am.  Splint fits well, total assist to don/doff.  Removed for AAROM exercises and passive prolonged stretch into extension, completed elbow exercises as well.  Pt demonstrates difficulty with isolating wrist movements and requires increased cues; hand over hand to complete ball squeeze to ensure hold of approx 5 seconds. Pt with wrist flexion without brace on, eager to eat lunch and encouraged to wear to attempt using R hand.  Provided built up fork (using wash cloth) but demonstrating difficulty due to elbow discomfort with preference to use L hand.  Therapist removed wrist brace again to allow pt to rest.  Educated RN on 2hrs on/off schedule. Plan to bring handout for room next session. Will follow.    Recommendations for follow up therapy are one component of a multi-disciplinary discharge planning process, led by the attending physician.  Recommendations may be updated based on patient status, additional functional criteria and insurance authorization.    Follow Up Recommendations  Skilled nursing-short term rehab (<3 hours/day)    Assistance Recommended at Discharge Frequent or constant Supervision/Assistance  Equipment Recommendations  None recommended by OT    Recommendations for Other Services      Precautions / Restrictions Precautions Precautions: Fall Restrictions Weight Bearing Restrictions: No       Mobility Bed Mobility                    Transfers                          Balance                                           ADL either performed or assessed with clinical judgement   ADL Overall ADL's : Needs assistance/impaired Eating/Feeding: Minimal assistance;Set up Eating/Feeding Details (indicate cue type and reason): upright in bed, poor grasp and coordination using dominant R UE (provided built up handle with washcloth) but limited by discomfort and weakness.  preference to use L hand.                                        Extremity/Trunk Assessment              Vision       Perception     Praxis      Cognition Arousal/Alertness: Awake/alert Behavior During Therapy: Flat affect Overall Cognitive Status: Impaired/Different from baseline Area of Impairment: Memory;Awareness;Problem solving                     Memory: Decreased short-term memory     Awareness: Emergent Problem Solving: Slow processing;Decreased initiation;Difficulty sequencing;Requires verbal cues            Exercises Exercises: General Upper Extremity;Other exercises General Exercises -  Upper Extremity Shoulder Flexion: AAROM;10 reps;Supine Elbow Extension: AAROM;PROM;Right;5 reps (prolonged passive stretch) Wrist Extension: AAROM;PROM;Right;10 reps (prolonged passive stretch into extension) Digit Composite Flexion: AAROM;PROM;Right;10 reps Composite Extension: AAROM;PROM;Right;10 reps Hand Exercises Forearm Supination: AROM;AAROM;Right;10 reps Forearm Pronation: AROM;AAROM;Right;10 reps Other Exercises Other Exercises: sqeeze ball x 10 reps r hand   Shoulder Instructions       General Comments      Pertinent Vitals/ Pain       Pain Assessment: Faces Faces Pain Scale: Hurts even more Pain Location: with R hand/arm ROM Pain Descriptors / Indicators: Discomfort;Grimacing;Guarding Pain Intervention(s): Limited activity within patient's tolerance;Monitored during  session;Repositioned  Home Living                                          Prior Functioning/Environment              Frequency  Min 3X/week        Progress Toward Goals  OT Goals(current goals can now be found in the care plan section)  Progress towards OT goals: Progressing toward goals  Acute Rehab OT Goals Patient Stated Goal: to go home OT Goal Formulation: With patient Time For Goal Achievement: 02/13/21 Potential to Achieve Goals: Good  Plan Discharge plan remains appropriate;Frequency remains appropriate    Co-evaluation                 AM-PAC OT "6 Clicks" Daily Activity     Outcome Measure   Help from another person eating meals?: A Little Help from another person taking care of personal grooming?: A Little Help from another person toileting, which includes using toliet, bedpan, or urinal?: A Lot Help from another person bathing (including washing, rinsing, drying)?: A Lot Help from another person to put on and taking off regular upper body clothing?: A Little Help from another person to put on and taking off regular lower body clothing?: A Lot 6 Click Score: 15    End of Session    OT Visit Diagnosis: Other abnormalities of gait and mobility (R26.89);Muscle weakness (generalized) (M62.81);Other symptoms and signs involving cognitive function;Pain Pain - Right/Left: Right Pain - part of body: Arm;Hand   Activity Tolerance Patient tolerated treatment well   Patient Left in bed;with call bell/phone within reach;with bed alarm set   Nurse Communication Other (comment) (eating lunch, doffed splint and needs it back on in 2 hours.  Discussed wearing schedule)        Time: 2563-8937 OT Time Calculation (min): 20 min  Charges: OT General Charges $OT Visit: 1 Visit OT Treatments $Therapeutic Exercise: 8-22 mins  [place] Pager [phone] Office [phone]   Delight Stare 02/07/2021, 1:48 PM

## 2021-02-07 NOTE — Progress Notes (Signed)
[facility] NEPHROLOGY PROGRESS NOTE  Assessment/ Plan:  Outpatient HD orders:[name] TTS,EDW 41.5 kg,[place]ath 2K/2.5 ca,BF 350  DF 500,Time - 180 minutes Access catheter  Medications - out of hectorol and now on calcitriol - 1.25 mcg each tx. Sensipar 90 mg three times a week; Epogen 3600 units each tx, Heparin 1000 units bolus then 500 units per hour Has been on vanc 500 mg each tx (though misses often)   # Acute encephalopathy: EEG with moderate diffuse encephalopathy of nonspecific etiology.  Based on neurology evaluation appears to be consistent with PRES along with imaging showing no evidence of acute CVA.  Seen by neurology.  Mental status seems seems to be stable.  # ESRD: She is on outpatient TTS schedule,  Left IJ TDC for the access.  Status post HD yesterday with around a liter of ultrafiltration, tolerated well.  Plan for next HD tomorrow.  # Anemia: Without overt blood loss, continue ESA with HD.  Transfuse as needed.  No iron because of infection and on antibiotics.  # CKD-MBD: Noted hypocalcemia therefore I will reduce Sensipar to 60 mg.  Continue calcitriol and PhosLo.  Phosphorus level acceptable.  Continue to monitor lab.   #  Right radiocephalic fistula infection: Status post ligation and debridement on 10/28 with inconsistent outpatient antibiotic therapy due to missing hemodialysis or signing off early.  Restarted back on antibiotic therapy with vancomycin.  #Ruptured pseudoaneurysm of right arm fistula: Status post right brachial bypass with right greater saphenous vein for bleeding ruptured pseudoaneurysm.  Appreciate vascular consult.  # Hypertension: Blood pressures elevated.  She is currently on amlodipine and metoprolol.  I will increase amlodipine to 10 mg.  Volume management with dialysis.    #Hypokalemia complicated by diarrhea.  Now becoming hyperkalemia.  Managed with dialysis.  Subjective: Seen and examined.  No new event.  Reports some pain  at the site of wound VAC.  Denies nausea, vomiting, chest pain or shortness of breath. Objective Vital signs in last 24 hours: Vitals:   02/07/21 0023 02/07/21 0645 02/07/21 0841 02/07/21 0855  BP: (!) 159/86 (!) 161/95 (!) 172/103 (!) 171/96  Pulse: 76 85 80   Resp: 19 18  18   Temp: 99.9 F (37.7 C) 99.7 F (37.6 C) 99.8 F (37.7 C)   TempSrc: Oral Oral Oral   SpO2: 98% 99% 99%   Weight:      Height:       Weight change: 0.2 kg  Intake/Output Summary (Last 24 hours) at 02/07/2021 0913 Last data filed at 02/06/2021 1859 Gross per 24 hour  Intake --  Output 913 ml  Net -913 ml        Labs: Basic Metabolic Panel: Recent Labs  Lab 02/01/21 0608 02/03/21 0952 02/04/21 0332 02/06/21 0125  NA 134* 135 132* 132*  K 3.8 4.2 5.9* 5.2*  CL 101 101 100 97*  CO2 24  --  22 24  GLUCOSE 94 97 79 58*  BUN 15 18 28* 29*  CREATININE 3.88* 4.70* 5.93* 6.26*  CALCIUM 9.1  --  8.5* 7.7*  PHOS  --   --  4.3 3.4    Liver Function Tests: Recent Labs  Lab 02/01/21 0608 02/04/21 0332 02/06/21 0125  AST 38  --   --   ALT 26  --   --   ALKPHOS 67  --   --   BILITOT 0.2*  --   --   PROT 7.0  --   --  ALBUMIN 2.7* 2.4* 2.5*    No results for input(s): LIPASE, AMYLASE in the last 168 hours. No results for input(s): AMMONIA in the last 168 hours.  CBC: Recent Labs  Lab 02/04/21 0332 02/04/21 1800 02/05/21 0151 02/06/21 0125 02/07/21 0126  WBC 6.8 6.2 7.0 8.6 7.8  HGB 6.4* 9.1* 9.3* 8.3* 7.9*  HCT 20.6* 27.7* 28.9* 26.3* 25.3*  MCV 101.5* 90.8 92.0 93.9 94.4  PLT 143* 130* 125* 135* 137*    Cardiac Enzymes: No results for input(s): CKTOTAL, CKMB, CKMBINDEX, TROPONINI in the last 168 hours. CBG: Recent Labs  Lab 02/06/21 1534 02/06/21 2054 02/07/21 0025 02/07/21 0641 02/07/21 0853  GLUCAP 128* 143* 86 158* 113*     Iron Studies: No results for input(s): IRON, TIBC, TRANSFERRIN, FERRITIN in the last 72 hours. Studies/Results: No results  found.  Medications: Infusions:    Scheduled Medications:  amLODipine  5 mg Oral Daily   apixaban  2.5 mg Oral BID   calcitRIOL  1.25 mcg Oral Q T,Th,Sa-HD   calcium acetate (Phos Binder)  1,334 mg Oral TID WC   chlorhexidine  15 mL Mouth Rinse BID   Chlorhexidine Gluconate Cloth  6 each Topical Q0600   cinacalcet  90 mg Oral Q T,Th,Sa-HD   darbepoetin (ARANESP) injection - DIALYSIS  40 mcg Intravenous Q Tue-HD   dolutegravir  50 mg Oral q1600   fluconazole  100 mg Oral Daily   lacosamide  150 mg Oral BID   lamiVUDine  50 mg Oral Daily   levETIRAcetam  1,000 mg Oral Daily   levETIRAcetam  250 mg Oral Q T,Th,Sat-1800   lidocaine-prilocaine  1 application Topical Q T,Th,Sa-HD   mouth rinse  15 mL Mouth Rinse q12n4p   metoprolol tartrate  25 mg Oral BID   mirtazapine  15 mg Oral QHS   nystatin  5 mL Oral QID   pantoprazole  40 mg Oral QHS   senna  2 tablet Oral QPM   thiamine  100 mg Oral Daily   vancomycin  500 mg Intravenous Q T,Th,Sa-HD   venlafaxine  75 mg Oral BID   zidovudine  100 mg Oral Q8H    have reviewed scheduled and prn medications.  Physical Exam: General: Able to lie flat, not in distress. Heart:RRR, s1s2 nl Lungs: Clear b/l, no crackle Abdomen:soft, Non-tender, non-distended Extremities: No peripheral edema. Dialysis Access: Left IJ TDC in place.  Anyely Cunning Prasad Lateasha Breuer 02/07/2021,9:13 AM  LOS: 15 days

## 2021-02-07 NOTE — Progress Notes (Signed)
SLP Cancellation Note  Patient Details Name: [name] MRN: [id number] DOB: 1951/02/24   Cancelled treatment:       Reason Eval/Treat Not Completed: Other (comment). Pt occupied with other providers. Several cancels this week. Pt has tolerated diet for a week and RN reports no concerns. Will sign off   Terrill Alperin, Katherene Ponto 02/07/2021, 2:14 PM

## 2021-02-07 NOTE — Progress Notes (Signed)
Physical Therapy Treatment Patient Details Name: [name] MRN: [id number] DOB: 1950-08-13 Today's Date: 02/07/2021   History of Present Illness 70 yo female admitted 01/23/21 after syncope at HD. 11/25 pt with HTN and seizure with LTM EEG. Pt with PRES. 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 PMhx: HTN, Aflutter, pulmonary HTN, HIV, GERD, chronic pain, ESRD on HD TTS.    PT Comments    Pt progressing with mobility. Able to progress OOB today to the chair with +2 assist for safety. Further activity limited by pain at RLE incision site - RN notified. Pt continues to be limited by generalized weakness, impaired balance, and decreased functional mobility. Will continue to follow acutely to address established goals.    Recommendations for follow up therapy are one component of a multi-disciplinary discharge planning process, led by the attending physician.  Recommendations may be updated based on patient status, additional functional criteria and insurance authorization.  Follow Up Recommendations  Skilled nursing-short term rehab (<3 hours/day)     Assistance Recommended at Discharge Frequent or constant Supervision/Assistance  Equipment Recommendations  Rolling walker (2 wheels);BSC/3in1    Recommendations for Other Services       Precautions / Restrictions Precautions Precautions: Fall Precaution Comments: wound vac right arm Restrictions Weight Bearing Restrictions: No     Mobility  Bed Mobility Overal bed mobility: Needs Assistance Bed Mobility: Supine to Sit     Supine to sit: Mod assist;HOB elevated     General bed mobility comments: Increased time. Mod A for BLE advancement, trunk elevation, getting hips to EOB. Pt able to use rail to assist    Transfers Overall transfer level: Needs assistance Equipment used: Rolling walker (2 wheels) Transfers: Sit to/from Stand;Bed to chair/wheelchair/BSC Sit to Stand: Mod assist;+2  safety/equipment     Step pivot transfers: Min assist;+2 safety/equipment     General transfer comment: Failed first attempt at standing from EOB. Stood again with emphasis on forward weight translation and momentum, Mod A for elevation and stability, cues for hand placement. Step pivot to recliner with min A for stability and RW management.    Ambulation/Gait               General Gait Details: deferred this session due to significant pain in RLE and fatigue   Stairs             Wheelchair Mobility    Modified Rankin (Stroke Patients Only)       Balance Overall balance assessment: Needs assistance Sitting-balance support: Feet supported;No upper extremity supported Sitting balance-Leahy Scale: Fair     Standing balance support: Bilateral upper extremity supported Standing balance-Leahy Scale: Poor Standing balance comment: reliant on BUE support and external support standing and mobility                            Cognition Arousal/Alertness: Awake/alert Behavior During Therapy: Flat affect Overall Cognitive Status: Impaired/Different from baseline Area of Impairment: Memory;Awareness;Problem solving                     Memory: Decreased short-term memory   Safety/Judgement: Decreased awareness of safety Awareness: Emergent Problem Solving: Slow processing;Decreased initiation;Difficulty sequencing;Requires verbal cues General Comments: suspect near baseline cognition        Exercises     General Comments General comments (skin integrity, edema, etc.): Session limited by pain in RLE incision - RN notified. Pt reports she is returning  to ALF on Monday where she receives HHPT.      Pertinent Vitals/Pain Pain Assessment: Faces Faces Pain Scale: Hurts whole lot Pain Location: R thigh incision with movement Pain Descriptors / Indicators: Grimacing;Guarding;Sharp;Tender Pain Intervention(s): Limited activity within patient's  tolerance;Monitored during session;Repositioned;Other (comment) (RN notified)    Home Living                          Prior Function            PT Goals (current goals can now be found in the care plan section) Acute Rehab PT Goals Patient Stated Goal: walk PT Goal Formulation: With patient Time For Goal Achievement: 02/13/21 Potential to Achieve Goals: Fair Progress towards PT goals: Progressing toward goals    Frequency           PT Plan Current plan remains appropriate    Co-evaluation              AM-PAC PT "6 Clicks" Mobility   Outcome Measure  Help needed turning from your back to your side while in a flat bed without using bedrails?: A Lot Help needed moving from lying on your back to sitting on the side of a flat bed without using bedrails?: A Lot Help needed moving to and from a bed to a chair (including a wheelchair)?: Total Help needed standing up from a chair using your arms (e.g., wheelchair or bedside chair)?: Total Help needed to walk in hospital room?: Total Help needed climbing 3-5 steps with a railing? : Total 6 Click Score: 8    End of Session Equipment Utilized During Treatment: Gait belt Activity Tolerance: Patient limited by pain Patient left: in chair;with chair alarm set;with call bell/phone within reach Nurse Communication: Mobility status (RLE incision) PT Visit Diagnosis: Muscle weakness (generalized) (M62.81);Other abnormalities of gait and mobility (R26.89);Difficulty in walking, not elsewhere classified (R26.2)     Time: 9390-3009 PT Time Calculation (min) (ACUTE ONLY): 29 min  Charges:  $Therapeutic Activity: 23-37 mins                     Brandon Melnick, SPT   Brandon Melnick 02/07/2021, 4:47 PM

## 2021-02-07 NOTE — Progress Notes (Signed)
Orthopedic Tech Progress Note Patient Details:  [name] 21-Jul-1950 [id number]  Ortho Devices Type of Ortho Device: Velcro wrist splint Ortho Device/Splint Location: right Ortho Device/Splint Interventions: Ordered, Application, Adjustment   Post Interventions Patient Tolerated: Well  Charline Bills Cortina Vultaggio 02/07/2021, 10:24 AM Applied velcro wrist splint

## 2021-02-07 NOTE — Progress Notes (Signed)
PROGRESS NOTE  [name] CXK:[id number] DOB: 04-17-1950 DOA: 01/23/2021 PCP: Hilbert Corrigan, MD   LOS: 15 days   Brief Narrative / Interim history: 71 year old female who has a history of a flutter on Eliquis, pulmonary hypertension, HIV, GERD, ESRD on TTS HD, fistula infection on vent treatment with HD as an outpatient but not consistently administered due to early termination of her HD, was brought to [facility] on 11/24 due to a syncopal episode while undergoing HD.  She also had intermittent confusion at that time.  After admission, on 11/25 became hypertensive, had a seizure and neurology was consulted.  An MRI of the brain was concerning for press.  She was started on Cardene drip and she was transferred to [facility] for ICU management and continuous EEG.  Her mental status continues to improve.  On 11/28 failed bedside speech eval and core track was placed.  On 11/29 her continuous EEG has been discontinued, and with continued improvement she was transferred to the hospitalist service on 12/1.  Nephrology also following for her dialysis needs.   Subjective: Patient complains of pain in the right arm.  The physician assistant with vascular surgery was at bedside as well and she was evaluating the right arm.  Good pulses were noted.  Patient denies any shortness of breath.  States that she had a better day yesterday without any nausea or vomiting.  Oral intake has improved.        Assessment & Plan:  Acute encephalopathy with PRES mentation seems to be back to baseline. Patient had a seizure episode in the setting of hypertension.  MRI showed cortical and subcortical edema in the occipital parietal lobes bilaterally.  She was subsequently admitted to the ICU, improved and transferred to the hospitalist service on 12/1.  Mentation seems to be stable.  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  EEG initially showed nonconvulsive status  epilepticus.  She underwent continuous EEG monitoring.   Currently on Vimpat and Keppra.   Has been seizure-free.    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   ID was consulted and recommending vancomycin with dialysis for total of 6 weeks with a last day 02/12/2021.   Wound has been stable.  Continue vancomycin with hemodialysis. Low-grade fever noted overnight.  WBC is normal.  Continue to monitor for now.  Pseudoaneurysm rupture right arm fistula Developed bleeding from her fistula on 12/5.  Had to be taken to the OR emergently.  Underwent control of hemorrhage and creation of a right brachial to brachial bypass.  Had a lot of bleeding from her saphenous vein harvesting site in the right lower extremity as well as from the right upper extremity.  This is likely because patient was on Plavix and Eliquis.  These medications were held. Surgical site as well as the right lower extremity site has been stable.  Some oozing of blood noted from the leg.  Good radial pulses.  Eliquis was resumed on 12/7.  Hemoglobin noted to be trending down.  Plavix has been discontinued Wound VAC has been placed.  Further management per vascular surgery  Essential hypertension/hypertensive emergency Presented with significantly elevated blood pressures with concern for press.  See above.  She was on amlodipine and metoprolol.   However after her bleeding episode from her fistula her blood pressures dropped.  Her  amlodipine and metoprolol were discontinued.   Over the last 48 hours her blood pressure has been trending upwards.  Metoprolol was resumed and dose was increased.  Will reinitiate amlodipine as well.  Peripheral vascular disease/innominate vein stenting Stent in the innominate vein was placed in 2019.  Has had multiple angioplasties since then.  She was placed on Plavix at that  time.  Now she is on Eliquis.  Discussed with Dr. Donzetta Matters about Plavix.  He is also reviewed the data.  She does not have any other indication for being on Plavix.  He recommends discontinuing it since patient is now on Eliquis.  Plavix was discontinued.  Acute blood loss anemia/anemia of chronic disease Drop in hemoglobin was from bleeding from her fistula.  Transfused 2 units of PRBC on 12/6.  Hemoglobin improved.  Came up to 9.  Hemoglobin has been trending downwards for the past 48 hours.  Noted to be 7.9 today.  Some oozing of blood has been noted from the right lower extremity.  We will continue to trend hemoglobin till stability.  May need to be transfused again but will wait and see what the levels are tomorrow.   Hypoglycemia Patient mentioned that oral intake has improved.  CBGs appears to have stabilized over the last 24 hours.  Continue to monitor.  Continue to encourage oral intake.     Nausea and vomiting Possibly related to dialysis as well as other acute issues.  Abdomen has been benign.  Symptoms appear to have improved.  Hold off on imaging studies for now.   Thrombocytopenia Stable.    Paroxysmal atrial fibrillation with rapid ventricular response She was noted to be bradycardic on 12/5-6.  Her metoprolol had to be held.  It was in the setting of acute bleeding anesthesia. Heart rate has stabilized.  Metoprolol was resumed which she is tolerating well.  Eliquis has been resumed as well  ESRD, on HD.  Being dialyzed on TTS schedule.  Nephrology is following.   HIV/oral thrush On antiretrovirals.  Continue Diflucan.   Severe protein calorie malnutrition  Cleared for oral intake by speech therapy.  Was briefly on core track tube feeding.  Needs encouragement with oral intake.  GERD -PPI   Hyponatremia-volume managed with dialysis   DVT prophylaxis: Continue Eliquis CODE STATUS: DNR Family communication: No family at bedside Disposition: Skilled nursing facility when  stable.  Hopefully early next week.  Status is: Inpatient  Remains inpatient appropriate because: Encephalopathy, seizure   Scheduled Meds:  sodium chloride   Intravenous Once   sodium chloride   Intravenous Once   apixaban  2.5 mg Oral BID   calcitRIOL  1.25 mcg Oral Q T,Th,Sa-HD   calcium acetate (Phos Binder)  1,334 mg Oral TID WC   chlorhexidine  15 mL Mouth Rinse BID   Chlorhexidine Gluconate Cloth  6 each Topical Q0600   cinacalcet  90 mg Oral Q T,Th,Sa-HD   darbepoetin (ARANESP) injection - DIALYSIS  40 mcg Intravenous Q Tue-HD   dolutegravir  50 mg Oral q1600   fluconazole  100 mg Oral Daily   lacosamide  150 mg Oral BID   lamiVUDine  50 mg Oral Daily   levETIRAcetam  1,000 mg Oral Daily   levETIRAcetam  250 mg Oral Q T,Th,Sat-1800   lidocaine-prilocaine  1 application Topical Q T,Th,Sa-HD   mouth rinse  15 mL Mouth Rinse q12n4p   metoprolol tartrate  25 mg Oral BID   mirtazapine  15 mg Oral QHS  nystatin  5 mL Oral QID   pantoprazole  40 mg Oral QHS   senna  2 tablet Oral QPM   thiamine  100 mg Oral Daily   vancomycin  500 mg Intravenous Q T,Th,Sa-HD   venlafaxine  75 mg Oral BID   zidovudine  100 mg Oral Q8H   Continuous Infusions:   PRN Meds:.acetaminophen **OR** acetaminophen, heparin sodium (porcine), hydrALAZINE, HYDROmorphone (DILAUDID) injection, lanolin/mineral oil, lidocaine, loperamide, LORazepam, ondansetron **OR** ondansetron (ZOFRAN) IV, ondansetron, oxyCODONE-acetaminophen, polyethylene glycol  Diet Orders (From admission, onward)     Start     Ordered   02/03/21 1600  Diet regular Room service appropriate? Yes with Assist; Fluid consistency: Thin  Diet effective now       Question Answer Comment  Room service appropriate? Yes with Assist   Fluid consistency: Thin      02/03/21 1600               Consultants:  PCCM Nephrology  Procedures:  As above  Microbiology  none  Antimicrobials: Vancomycin    Objective: Vitals:    02/06/21 1532 02/06/21 2051 02/07/21 0023 02/07/21 0645  BP: 135/84 (!) 152/80 (!) 159/86 (!) 161/95  Pulse: 80 83 76 85  Resp: 17 17 19 18   Temp: 98.6 F (37 C) 100.3 F (37.9 C) 99.9 F (37.7 C) 99.7 F (37.6 C)  TempSrc: Oral Oral Oral Oral  SpO2:  96% 98% 99%  Weight:      Height:        Intake/Output Summary (Last 24 hours) at 02/07/2021 0829 Last data filed at 02/06/2021 1859 Gross per 24 hour  Intake 200 ml  Output 913 ml  Net -713 ml    Filed Weights   02/06/21 0410 02/06/21 0920 02/06/21 1311  Weight: 41.3 kg 41.5 kg 38.7 kg    Examination:  General appearance: Awake alert.  In no distress Resp: Clear to auscultation bilaterally.  Normal effort Cardio: S1-S2 is normal regular.  No S3-S4.  No rubs murmurs or bruit GI: Abdomen is soft.  Nontender nondistended.  Bowel sounds are present normal.  No masses organomegaly Extremities: Wound VAC noted over the right arm.  Good radial pulses.  Dressing over the right lower extremity Neurologic: No focal neurological deficits.      Data Reviewed: I have independently reviewed following labs and imaging studies   CBC: Recent Labs  Lab 02/04/21 0332 02/04/21 1800 02/05/21 0151 02/06/21 0125 02/07/21 0126  WBC 6.8 6.2 7.0 8.6 7.8  HGB 6.4* 9.1* 9.3* 8.3* 7.9*  HCT 20.6* 27.7* 28.9* 26.3* 25.3*  MCV 101.5* 90.8 92.0 93.9 94.4  PLT 143* 130* 125* 135* 137*    Basic Metabolic Panel: Recent Labs  Lab 01/31/21 2030 02/01/21 0608 02/03/21 0952 02/04/21 0332 02/06/21 0125  NA  --  134* 135 132* 132*  K 3.4* 3.8 4.2 5.9* 5.2*  CL  --  101 101 100 97*  CO2  --  24  --  22 24  GLUCOSE  --  94 97 79 58*  BUN  --  15 18 28* 29*  CREATININE  --  3.88* 4.70* 5.93* 6.26*  CALCIUM  --  9.1  --  8.5* 7.7*  MG 1.9  --   --   --   --   PHOS  --   --   --  4.3 3.4    Liver Function Tests: Recent Labs  Lab 02/01/21 0608 02/04/21 0332 02/06/21 0125  AST 38  --   --  ALT 26  --   --   ALKPHOS 67  --   --    BILITOT 0.2*  --   --   PROT 7.0  --   --   ALBUMIN 2.7* 2.4* 2.5*    CBG: Recent Labs  Lab 02/06/21 1350 02/06/21 1534 02/06/21 2054 02/07/21 0025 02/07/21 0641  GLUCAP 78 128* 143* 86 158*     Recent Results (from the past 240 hour(s))  Resp Panel by RT-PCR (Flu A&B, Covid) Nasopharyngeal Swab     Status: None   Collection Time: 02/03/21  5:59 PM   Specimen: Nasopharyngeal Swab; Nasopharyngeal(NP) swabs in vial transport medium  Result Value Ref Range Status   SARS Coronavirus 2 by RT PCR NEGATIVE NEGATIVE Final    Comment: (NOTE) SARS-CoV-2 target nucleic acids are NOT DETECTED.  The SARS-CoV-2 RNA is generally detectable in upper respiratory specimens during the acute phase of infection. The lowest concentration of SARS-CoV-2 viral copies this assay can detect is 138 copies/mL. A negative result does not preclude SARS-Cov-2 infection and should not be used as the sole basis for treatment or other patient management decisions. A negative result may occur with  improper specimen collection/handling, submission of specimen other than nasopharyngeal swab, presence of viral mutation(s) within the areas targeted by this assay, and inadequate number of viral copies(<138 copies/mL). A negative result must be combined with clinical observations, patient history, and epidemiological information. The expected result is Negative.  Fact Sheet for Patients:  EntrepreneurPulse.com.au  Fact Sheet for Healthcare Providers:  IncredibleEmployment.be  This test is no t yet approved or cleared by the [place] FDA and  has been authorized for detection and/or diagnosis of SARS-CoV-2 by FDA under an Emergency Use Authorization (EUA). This EUA will remain  in effect (meaning this test can be used) for the duration of the COVID-19 declaration under Section 564(b)(1) of the Act, 21 U.S.C.section 360bbb-3(b)(1), unless the authorization is  terminated  or revoked sooner.       Influenza A by PCR NEGATIVE NEGATIVE Final   Influenza B by PCR NEGATIVE NEGATIVE Final    Comment: (NOTE) The Xpert Xpress SARS-CoV-2/FLU/RSV plus assay is intended as an aid in the diagnosis of influenza from Nasopharyngeal swab specimens and should not be used as a sole basis for treatment. Nasal washings and aspirates are unacceptable for Xpert Xpress SARS-CoV-2/FLU/RSV testing.  Fact Sheet for Patients: EntrepreneurPulse.com.au  Fact Sheet for Healthcare Providers: IncredibleEmployment.be  This test is not yet approved or cleared by the [place] FDA and has been authorized for detection and/or diagnosis of SARS-CoV-2 by FDA under an Emergency Use Authorization (EUA). This EUA will remain in effect (meaning this test can be used) for the duration of the COVID-19 declaration under Section 564(b)(1) of the Act, 21 U.S.C. section 360bbb-3(b)(1), unless the authorization is terminated or revoked.  Performed at [facility], [place] [address], [place], [place] [address]       Radiology Studies: No results found.  Bonnielee Haff 02/07/2021  [facility]

## 2021-02-07 NOTE — Progress Notes (Addendum)
[facility] [place]  Subjective  - Right pain, unable to dorsiflex wrist   Objective (!) 161/95 85 99.7 F (37.6 C) (Oral) 18 99%  Intake/Output Summary (Last 24 hours) at 02/07/2021 0740 Last data filed at 02/06/2021 1859 Gross per 24 hour  Intake 200 ml  Output 913 ml  Net -713 ml    Right UE wound vac in place with good suction, negative drainage Palpable radial pulse, negative wrist dorsi flexion Right thigh vein harvest site dressing changed by RN this am, change as needed today after activity Lungs non labored breathing   Assessment/Planning: POD # 4 status post right brachial brachial bypass with right greater saphenous vein for bleeding ruptured pseudoaneurysm.  a plug of 7 x 10 cm myriad matrix was inserted into the brachial arteriotomy  Right UE well perfused, continued pain, negative active dorsiflexion Eliquis restarted, Plavix has been D/C's.   I will ask OT for assistance with possible wrist splint Pain control with Oxycodone 1-2 q6 will increase to q4 for better pain control   [name] 02/07/2021 7:40 AM --  Laboratory Lab Results: Recent Labs    02/06/21 0125 02/07/21 0126  WBC 8.6 7.8  HGB 8.3* 7.9*  HCT 26.3* 25.3*  PLT 135* 137*   BMET Recent Labs    02/06/21 0125  NA 132*  K 5.2*  CL 97*  CO2 24  GLUCOSE 58*  BUN 29*  CREATININE 6.26*  CALCIUM 7.7*    COAG Lab Results  Component Value Date   INR 1.2 01/24/2021   INR 1.14 12/28/2011   INR 1.10 12/13/2010   No results found for: PTT  VASCULAR STAFF ADDENDUM: I have independently interviewed and examined the patient. I agree with the above.  No issues from vascular standpoint.  Continue VAC to skin replacement. Next change Monday. Agree with wrist splinting.  Please call Dr. Trula Slade for questions over the weekend. I will check back in on Monday.  [name]. Stanford Breed, MD [facility] Phone Number:  [phone] 02/07/2021 10:22 AM

## 2021-02-08 DIAGNOSIS — R509 Fever, unspecified: Secondary | ICD-10-CM

## 2021-02-08 DIAGNOSIS — A4902 Methicillin resistant Staphylococcus aureus infection, unspecified site: Secondary | ICD-10-CM | POA: Diagnosis not present

## 2021-02-08 DIAGNOSIS — D62 Acute posthemorrhagic anemia: Secondary | ICD-10-CM | POA: Diagnosis not present

## 2021-02-08 DIAGNOSIS — Z515 Encounter for palliative care: Secondary | ICD-10-CM | POA: Diagnosis not present

## 2021-02-08 DIAGNOSIS — I4811 Longstanding persistent atrial fibrillation: Secondary | ICD-10-CM | POA: Diagnosis not present

## 2021-02-08 LAB — CBC
HCT: 28 % — ABNORMAL LOW (ref 36.0–46.0)
Hemoglobin: 8.8 g/dL — ABNORMAL LOW (ref 12.0–15.0)
MCH: 30.1 pg (ref 26.0–34.0)
MCHC: 31.4 g/dL (ref 30.0–36.0)
MCV: 95.9 fL (ref 80.0–100.0)
Platelets: 173 10*3/uL (ref 150–400)
RBC: 2.92 MIL/uL — ABNORMAL LOW (ref 3.87–5.11)
RDW: 21.3 % — ABNORMAL HIGH (ref 11.5–15.5)
WBC: 9.4 10*3/uL (ref 4.0–10.5)
nRBC: 0 % (ref 0.0–0.2)

## 2021-02-08 LAB — BASIC METABOLIC PANEL
Anion gap: 11 (ref 5–15)
BUN: 25 mg/dL — ABNORMAL HIGH (ref 8–23)
CO2: 24 mmol/L (ref 22–32)
Calcium: 7.5 mg/dL — ABNORMAL LOW (ref 8.9–10.3)
Chloride: 96 mmol/L — ABNORMAL LOW (ref 98–111)
Creatinine, Ser: 5.4 mg/dL — ABNORMAL HIGH (ref 0.44–1.00)
GFR, Estimated: 8 mL/min — ABNORMAL LOW (ref >60)
Glucose, Bld: 96 mg/dL (ref 70–99)
Potassium: 5 mmol/L (ref 3.5–5.1)
Sodium: 131 mmol/L — ABNORMAL LOW (ref 135–145)

## 2021-02-08 LAB — GLUCOSE, CAPILLARY
Glucose-Capillary: 103 mg/dL — ABNORMAL HIGH (ref 70–99)
Glucose-Capillary: 128 mg/dL — ABNORMAL HIGH (ref 70–99)
Glucose-Capillary: 175 mg/dL — ABNORMAL HIGH (ref 70–99)
Glucose-Capillary: 89 mg/dL (ref 70–99)
Glucose-Capillary: 97 mg/dL (ref 70–99)

## 2021-02-08 NOTE — Progress Notes (Signed)
Pt's wrist splint was put on from 2pm until 4:20 pm and then removed.

## 2021-02-08 NOTE — Procedures (Signed)
Patient was seen on dialysis and the procedure was supervised.  BFR 400  Via TDC BP is  167/99.   Patient appears to be tolerating treatment well. Mild fever today, asymptomatic. Checking blood culture.  Increase UF goal to 3.5 L. Monitor BP D/w dialysis nurse  Ellena Kamen Tanna Furry 02/08/2021

## 2021-02-08 NOTE — Progress Notes (Signed)
   02/08/21 1107  Vitals  Temp 97.7 F (36.5 C)  Temp Source Oral  BP 119/69  BP Location Left Leg  BP Method Automatic  Patient Position (if appropriate) Lying  Pulse Rate 93  Pulse Rate Source Monitor  Resp 20  Oxygen Therapy  SpO2 100 %  O2 Device Room Air  Dialysis Weight  Weight 37.3 kg  Type of Weight Post-Dialysis  Post-Hemodialysis Assessment  Rinseback Volume (mL) 250 mL  KECN 241 V  Dialyzer Clearance Lightly streaked  Duration of HD Treatment -hour(s) 3.5 hour(s)  Hemodialysis Intake (mL) 600 mL  UF Total -Machine (mL) 3600 mL  Net UF (mL) 3000 mL  Tolerated HD Treatment Yes  Post-Hemodialysis Comments tx complete-pt stable  Hemodialysis Catheter Left Internal jugular Double lumen Permanent (Tunneled)  Placement Date/Time: 12/30/20 1137   Placed prior to admission: No  Time Out: Correct patient;Correct site;Correct procedure  Maximum sterile barrier precautions: Hand hygiene;Cap;Mask;Sterile gown;Sterile gloves;Large sterile sheet  Site Prep: Other ...  Site Condition No complications  Blue Lumen Status Flushed;Dead end cap in place;Heparin locked  Red Lumen Status Flushed;Dead end cap in place;Heparin locked  Purple Lumen Status N/A  Catheter fill solution Heparin 1000 units/ml  Catheter fill volume (Arterial) 1.9 cc  Catheter fill volume (Venous) 1.9  Dressing Type Transparent  Dressing Status Clean;Dry;Intact  Antimicrobial disc in place? Yes  Interventions Dressing reinforced  Drainage Description None  Dressing Change Due 02/13/21  Post treatment catheter status Capped and Clamped  HD tx complete, pt stable. Pt noted with Temp-100.7 and SBP 180s. Dr. Carolin Sicks and attending MD-Dr. Maryland Pink made aware. New orders noted for blood cultures x2, lab has not been to unit to draw specimens. Primary nurse made aware.

## 2021-02-08 NOTE — Progress Notes (Signed)
   Palliative Medicine Inpatient Follow Up Note  HPI: [name] is a 70 year old female with PMH significant for ESRD on HD TTS, HIV, atrial flutter on Eliquis, anemia of chronic disease, chronic pain,GERD,  and recent infection involving right AV fistula status post washout and currently on IV vancomycin with wound vac. She was transferred to [facility] from [facility] ER where she was admitted after syncope while on hemodialysis with a recent development of intermittent confusion. She had a  seizure episode and was MRI raised possibility of PRES. Other concerns have been hypertension, elevated TSH and elevated T4 with no known hyperthyroidism history.   Received hemodialysis last night. She went into afib with RVR, started on amiodarone with heart rate control. LP ruled out acute meningitis/encephalitis. 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 Mental status is improving.   Per prior palliative care notes [name] had been fairly adamant in the past about not wishing for supplemental nutrition via feeding tube.  She had been educated on ways to increase her nutritional state though unfortunately she had been readmitted and remains to have insufficient nutritional support.  Today's Discussion (02/08/2021):  Chart reviewed.   I met with [name] at bedside this afternoon. We reviewed the events of the week including going to the OR for repair of her R arm fistula. She expressed that things are better and refers to R arm wound vac. She shares that the only troubling thing regarding this is the pain she is experiencing which comes and goes. Discussed her pain medication regiment which does seem to be curtailing the discomfort. Reviewed her appetite which is about the same. Reviewed the plan for return to [facility] at the beginning of the week if she continues to be stable.   Questions and concerns answered  Palliative support provided  Objective  Assessment: Vital Signs Vitals:   02/08/21 1107 02/08/21 1200  BP: 119/69 123/79  Pulse: 93 93  Resp: 20 16  Temp: 97.7 F (36.5 C) 99.2 F (37.3 C)  SpO2: 100% 98%    Intake/Output Summary (Last 24 hours) at 02/08/2021 1328 Last data filed at 02/08/2021 1107 Gross per 24 hour  Intake 560 ml  Output 3000 ml  Net -2440 ml    Last Weight  Most recent update: 02/08/2021 11:14 AM    Weight  37.3 kg (82 lb 3.7 oz)            SUMMARY OF RECOMMENDATIONS   DNAR/DNI, No gastrostomy tube per patient  Ongoing palliative care support  Code Status/Advance Care Planning: DNAR/DNI, No gastrostomy tube --> Patient is clear about this and daughter is aware. MOST in Vynca reflects this  Limited additional interventions: use medical treatment, IV fluids and cardiac monitoring as indicated  Incremental PMT support  Plan for discharge back to [facility] when optimized  Time Spent: 15 minutes Greater than 50% of the time was spent in counseling and coordination of care ______________________________________________________________________________________ [name], [place] Team Team Cell Phone: [phone] Please utilize secure chat with additional questions, if there is no response within 30 minutes please call the above phone number  Palliative Medicine Team providers are available by phone from 7am to 7pm daily and can be reached through the team cell phone.  Should this patient require assistance outside of these hours, please call the patient's attending physician.

## 2021-02-08 NOTE — Progress Notes (Signed)
OT Cancellation Note  Patient Details Name: [name] MRN: [id number] DOB: 1950-03-07   Cancelled Treatment:    Reason Eval/Treat Not Completed: Patient at procedure or test/ unavailable. Pt off unit for HD this AM  Layla Maw 02/08/2021, 7:01 AM

## 2021-02-08 NOTE — Progress Notes (Signed)
PROGRESS NOTE  [name] RJJ:[id number] DOB: 02/03/1951 DOA: 01/23/2021 PCP: Hilbert Corrigan, MD   LOS: 16 days   Brief Narrative / Interim history: 70 year old female who has a history of a flutter on Eliquis, pulmonary hypertension, HIV, GERD, ESRD on TTS HD, fistula infection on vent treatment with HD as an outpatient but not consistently administered due to early termination of her HD, was brought to [facility] on 11/24 due to a syncopal episode while undergoing HD.  She also had intermittent confusion at that time.  After admission, on 11/25 became hypertensive, had a seizure and neurology was consulted.  An MRI of the brain was concerning for press.  She was started on Cardene drip and she was transferred to [facility] for ICU management and continuous EEG.  Her mental status continues to improve.  On 11/28 failed bedside speech eval and core track was placed.  On 11/29 her continuous EEG has been discontinued, and with continued improvement she was transferred to the hospitalist service on 12/1.  Nephrology also following for her dialysis needs.   Subjective: Patient continues to have pain in the right arm.  Low-grade fevers noted this morning per dialysis nurse.        Assessment & Plan:  Acute encephalopathy with PRES mentation seems to be back to baseline. Patient had a seizure episode in the setting of hypertension.  MRI showed cortical and subcortical edema in the occipital parietal lobes bilaterally.  She was subsequently admitted to the ICU, improved and transferred to the hospitalist service on 12/1.  Mentation seems to be stable.  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  EEG initially showed nonconvulsive status epilepticus.  She underwent continuous EEG monitoring.   Currently on Vimpat and Keppra.   Has been seizure-free.    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   ID was consulted and recommending vancomycin with dialysis for total of 6 weeks with a last day 02/12/2021.   Wound has been stable.  Continue vancomycin with hemodialysis.  Low-grade fever Noted to have a temperature of 100.7 this morning.  Blood cultures to be sent.  No obvious areas of infection noted on the right lower extremity incision site.  WBC is noted to be normal.  Will check again tomorrow.  Pseudoaneurysm rupture right arm fistula Developed bleeding from her fistula on 12/5.  Had to be taken to the OR emergently.  Underwent control of hemorrhage and creation of a right brachial to brachial bypass.  Had a lot of bleeding from her saphenous vein harvesting site in the right lower extremity as well as from the right upper extremity.  This is likely because patient was on Plavix and Eliquis.  These medications were held. Bleeding appears to have subsided.  Vascular surgery continues to follow.  Has a wound VAC on the right upper extremity. Eliquis was resumed on 12/7.  Hemoglobin did trend down yesterday but noted to be better today.  Essential hypertension/hypertensive emergency Presented with significantly elevated blood pressures with concern for press.  See above.  She was on amlodipine and metoprolol.   However after her bleeding episode from her fistula her blood pressures dropped.  Her amlodipine and metoprolol were held.   Subsequently blood pressure is noted to be in the hypertensive range.  Amlodipine and metoprolol resumed.  Peripheral vascular disease/innominate  vein stenting Stent in the innominate vein was placed in 2019.  Has had multiple angioplasties since then.  She was placed on Plavix at that time.  Now she is on Eliquis.  Discussed with Dr. Donzetta Matters about Plavix.  He is also reviewed the data.  She does not have any other indication for being on Plavix.  He recommends discontinuing it since patient is  now on Eliquis.  Plavix was discontinued.  Acute blood loss anemia/anemia of chronic disease Drop in hemoglobin was from bleeding from her fistula.  Transfused 2 units of PRBC on 12/6.  Hemoglobin improved and came up to 9.  Trended down yesterday but noted to be better today.   Continue to trend.   Hypoglycemia Appears to have improved with improvement in oral intake.  Continue to monitor.     Nausea and vomiting Possibly related to dialysis as well as other acute issues.  Abdomen has been benign.  Symptoms appear to have improved.  Hold off on imaging studies for now.   Thrombocytopenia Appears to have resolved.    Paroxysmal atrial fibrillation with rapid ventricular response She was noted to be bradycardic on 12/5-6.  Her metoprolol had to be held.  It was in the setting of acute bleeding anesthesia. Heart rate has stabilized.  Metoprolol was resumed which she is tolerating well.  Eliquis has been resumed as well  ESRD, on HD.  Being dialyzed on TTS schedule.  Nephrology is following.   HIV/oral thrush On antiretrovirals.  Was started on Diflucan on 12/4.  We will recheck her lesions tomorrow and then determine duration of treatment.   Severe protein calorie malnutrition  Cleared for oral intake by speech therapy.  Was briefly on core track tube feeding.  Needs encouragement with oral intake.  GERD -PPI   Hyponatremia-volume managed with dialysis   DVT prophylaxis: Continue Eliquis CODE STATUS: DNR Family communication: No family at bedside Disposition: Skilled nursing facility when stable.  Hopefully early next week.  Status is: Inpatient  Remains inpatient appropriate because: Encephalopathy, seizure   Scheduled Meds:  amLODipine  10 mg Oral Daily   apixaban  2.5 mg Oral BID   calcitRIOL  1.25 mcg Oral Q T,Th,Sa-HD   calcium acetate (Phos Binder)  1,334 mg Oral TID WC   chlorhexidine  15 mL Mouth Rinse BID   Chlorhexidine Gluconate Cloth  6 each Topical Q0600    cinacalcet  60 mg Oral Q T,Th,Sa-HD   darbepoetin (ARANESP) injection - DIALYSIS  40 mcg Intravenous Q Tue-HD   dolutegravir  50 mg Oral q1600   fluconazole  100 mg Oral Daily   lacosamide  150 mg Oral BID   lamiVUDine  50 mg Oral Daily   levETIRAcetam  1,000 mg Oral Daily   levETIRAcetam  250 mg Oral Q T,Th,Sat-1800   lidocaine-prilocaine  1 application Topical Q T,Th,Sa-HD   mouth rinse  15 mL Mouth Rinse q12n4p   metoprolol tartrate  25 mg Oral BID   mirtazapine  15 mg Oral QHS   nystatin  5 mL Oral QID   pantoprazole  40 mg Oral QHS   senna  2 tablet Oral QPM   thiamine  100 mg Oral Daily   vancomycin  500 mg Intravenous Q T,Th,Sa-HD   venlafaxine  75 mg Oral BID   zidovudine  100 mg Oral Q8H   Continuous Infusions:   PRN Meds:.acetaminophen **OR** acetaminophen, heparin sodium (porcine), hydrALAZINE, HYDROmorphone (DILAUDID) injection, lanolin/mineral oil, lidocaine, loperamide, LORazepam, ondansetron **OR** ondansetron (  ZOFRAN) IV, ondansetron, oxyCODONE-acetaminophen, polyethylene glycol  Diet Orders (From admission, onward)     Start     Ordered   02/03/21 1600  Diet regular Room service appropriate? Yes with Assist; Fluid consistency: Thin  Diet effective now       Question Answer Comment  Room service appropriate? Yes with Assist   Fluid consistency: Thin      02/03/21 1600               Consultants:  PCCM Nephrology  Procedures:  As above  Microbiology  none  Antimicrobials: Vancomycin    Objective: Vitals:   02/08/21 0830 02/08/21 0900 02/08/21 0930 02/08/21 1000  BP: (!) 167/99 (!) 149/98 (!) 142/94 (!) 134/105  Pulse: 82 78 88 96  Resp: 16 16 17 19   Temp:      TempSrc:      SpO2:      Weight:      Height:        Intake/Output Summary (Last 24 hours) at 02/08/2021 1050 Last data filed at 02/08/2021 0300 Gross per 24 hour  Intake 560 ml  Output 0 ml  Net 560 ml    Filed Weights   02/06/21 1311 02/08/21 0422 02/08/21 0710   Weight: 38.7 kg 41.2 kg 40.2 kg    Examination:  General appearance: Awake alert.  In no distress Resp: Clear to auscultation bilaterally.  Normal effort Cardio: S1-S2 is normal regular.  No S3-S4.  No rubs murmurs or bruit GI: Abdomen is soft.  Nontender nondistended.  Bowel sounds are present normal.  No masses organomegaly Extremities: Wound VAC noted over right upper extremity.  No bleeding noted from the right lower extremity incision site.  No erythema noted. Neurologic: No focal neurological deficits.      Data Reviewed: I have independently reviewed following labs and imaging studies   CBC: Recent Labs  Lab 02/04/21 1800 02/05/21 0151 02/06/21 0125 02/07/21 0126 02/08/21 0151  WBC 6.2 7.0 8.6 7.8 9.4  HGB 9.1* 9.3* 8.3* 7.9* 8.8*  HCT 27.7* 28.9* 26.3* 25.3* 28.0*  MCV 90.8 92.0 93.9 94.4 95.9  PLT 130* 125* 135* 137* 704    Basic Metabolic Panel: Recent Labs  Lab 02/03/21 0952 02/04/21 0332 02/06/21 0125 02/08/21 0151  NA 135 132* 132* 131*  K 4.2 5.9* 5.2* 5.0  CL 101 100 97* 96*  CO2  --  22 24 24   GLUCOSE 97 79 58* 96  BUN 18 28* 29* 25*  CREATININE 4.70* 5.93* 6.26* 5.40*  CALCIUM  --  8.5* 7.7* 7.5*  PHOS  --  4.3 3.4  --     Liver Function Tests: Recent Labs  Lab 02/04/21 0332 02/06/21 0125  ALBUMIN 2.4* 2.5*    CBG: Recent Labs  Lab 02/07/21 1110 02/07/21 1622 02/07/21 2034 02/08/21 0006 02/08/21 0420  GLUCAP 72 96 95 128* 97     Recent Results (from the past 240 hour(s))  Resp Panel by RT-PCR (Flu A&B, Covid) Nasopharyngeal Swab     Status: None   Collection Time: 02/03/21  5:59 PM   Specimen: Nasopharyngeal Swab; Nasopharyngeal(NP) swabs in vial transport medium  Result Value Ref Range Status   SARS Coronavirus 2 by RT PCR NEGATIVE NEGATIVE Final    Comment: (NOTE) SARS-CoV-2 target nucleic acids are NOT DETECTED.  The SARS-CoV-2 RNA is generally detectable in upper respiratory specimens during the acute phase of  infection. The lowest concentration of SARS-CoV-2 viral copies this assay can detect is 138 copies/mL.  A negative result does not preclude SARS-Cov-2 infection and should not be used as the sole basis for treatment or other patient management decisions. A negative result may occur with  improper specimen collection/handling, submission of specimen other than nasopharyngeal swab, presence of viral mutation(s) within the areas targeted by this assay, and inadequate number of viral copies(<138 copies/mL). A negative result must be combined with clinical observations, patient history, and epidemiological information. The expected result is Negative.  Fact Sheet for Patients:  EntrepreneurPulse.com.au  Fact Sheet for Healthcare Providers:  IncredibleEmployment.be  This test is no t yet approved or cleared by the [place] FDA and  has been authorized for detection and/or diagnosis of SARS-CoV-2 by FDA under an Emergency Use Authorization (EUA). This EUA will remain  in effect (meaning this test can be used) for the duration of the COVID-19 declaration under Section 564(b)(1) of the Act, 21 U.S.C.section 360bbb-3(b)(1), unless the authorization is terminated  or revoked sooner.       Influenza A by PCR NEGATIVE NEGATIVE Final   Influenza B by PCR NEGATIVE NEGATIVE Final    Comment: (NOTE) The Xpert Xpress SARS-CoV-2/FLU/RSV plus assay is intended as an aid in the diagnosis of influenza from Nasopharyngeal swab specimens and should not be used as a sole basis for treatment. Nasal washings and aspirates are unacceptable for Xpert Xpress SARS-CoV-2/FLU/RSV testing.  Fact Sheet for Patients: EntrepreneurPulse.com.au  Fact Sheet for Healthcare Providers: IncredibleEmployment.be  This test is not yet approved or cleared by the [place] FDA and has been authorized for detection and/or diagnosis of SARS-CoV-2  by FDA under an Emergency Use Authorization (EUA). This EUA will remain in effect (meaning this test can be used) for the duration of the COVID-19 declaration under Section 564(b)(1) of the Act, 21 U.S.C. section 360bbb-3(b)(1), unless the authorization is terminated or revoked.  Performed at [facility], [place] [address] [place], [place] [address]       Radiology Studies: No results found.  Bonnielee Haff 02/08/2021  [facility]

## 2021-02-09 DIAGNOSIS — R509 Fever, unspecified: Secondary | ICD-10-CM | POA: Diagnosis not present

## 2021-02-09 DIAGNOSIS — A4902 Methicillin resistant Staphylococcus aureus infection, unspecified site: Secondary | ICD-10-CM | POA: Diagnosis not present

## 2021-02-09 DIAGNOSIS — D62 Acute posthemorrhagic anemia: Secondary | ICD-10-CM | POA: Diagnosis not present

## 2021-02-09 DIAGNOSIS — I4811 Longstanding persistent atrial fibrillation: Secondary | ICD-10-CM | POA: Diagnosis not present

## 2021-02-09 LAB — GLUCOSE, CAPILLARY
Glucose-Capillary: 102 mg/dL — ABNORMAL HIGH (ref 70–99)
Glucose-Capillary: 107 mg/dL — ABNORMAL HIGH (ref 70–99)
Glucose-Capillary: 127 mg/dL — ABNORMAL HIGH (ref 70–99)
Glucose-Capillary: 183 mg/dL — ABNORMAL HIGH (ref 70–99)
Glucose-Capillary: 194 mg/dL — ABNORMAL HIGH (ref 70–99)
Glucose-Capillary: 97 mg/dL (ref 70–99)
Glucose-Capillary: 97 mg/dL (ref 70–99)

## 2021-02-09 LAB — CBC
HCT: 29.9 % — ABNORMAL LOW (ref 36.0–46.0)
Hemoglobin: 9.5 g/dL — ABNORMAL LOW (ref 12.0–15.0)
MCH: 30 pg (ref 26.0–34.0)
MCHC: 31.8 g/dL (ref 30.0–36.0)
MCV: 94.3 fL (ref 80.0–100.0)
Platelets: 189 10*3/uL (ref 150–400)
RBC: 3.17 MIL/uL — ABNORMAL LOW (ref 3.87–5.11)
RDW: 21.4 % — ABNORMAL HIGH (ref 11.5–15.5)
WBC: 8.2 10*3/uL (ref 4.0–10.5)
nRBC: 0 % (ref 0.0–0.2)

## 2021-02-09 NOTE — Progress Notes (Signed)
PROGRESS NOTE  [name] PIR:[id number] DOB: [birth year] DOA: 01/23/2021 PCP: Hilbert Corrigan, MD   LOS: 17 days   Brief Narrative / Interim history: 70 year old female who has a history of a flutter on Eliquis, pulmonary hypertension, HIV, GERD, ESRD on TTS HD, fistula infection on vent treatment with HD as an outpatient but not consistently administered due to early termination of her HD, was brought to [facility] on 11/24 due to a syncopal episode while undergoing HD.  She also had intermittent confusion at that time.  After admission, on 11/25 became hypertensive, had a seizure and neurology was consulted.  An MRI of the brain was concerning for press.  She was started on Cardene drip and she was transferred to [facility] for ICU management and continuous EEG.  Her mental status continues to improve.  On 11/28 failed bedside speech eval and core track was placed.  On 11/29 her continuous EEG has been discontinued, and with continued improvement she was transferred to the hospitalist service on 12/1.  Nephrology also following for her dialysis needs.   Subjective: Overall patient feels well.  Continues to have some pain in the right arm and right leg.  Otherwise denies any other complaints.         Assessment & Plan:  Acute encephalopathy with PRES mentation seems to be back to baseline. Patient had a seizure episode in the setting of hypertension.  MRI showed cortical and subcortical edema in the occipital parietal lobes bilaterally.  She was subsequently admitted to the ICU, improved and transferred to the hospitalist service on 12/1.  Mentation is stable.  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  EEG initially showed nonconvulsive status epilepticus.  She underwent continuous EEG monitoring.   Currently on Vimpat and Keppra.   Has been seizure-free.    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   ID was consulted and recommending vancomycin with dialysis for total of 6 weeks with a last day 02/12/2021.   Wound has been stable.  Continue vancomycin with hemodialysis.  Low-grade fever Noted to have a temperature of 100.7 yesterday morning.  Blood cultures were sent.  Noted to be afebrile subsequently.  Denies any new complaints.  No obvious area of infection noted.  WBC remains normal.   Pseudoaneurysm rupture right arm fistula Developed bleeding from her fistula on 12/5.  Had to be taken to the OR emergently.  Underwent control of hemorrhage and creation of a right brachial to brachial bypass.  Had a lot of bleeding from her saphenous vein harvesting site in the right lower extremity as well as from the right upper extremity.  This is likely because patient was on Plavix and Eliquis.  These medications were held. Bleeding appears to have subsided.  Vascular surgery continues to follow.  Has a wound VAC on the right upper extremity. Eliquis was resumed on 12/7.  Hemoglobin stable for the most part.  Essential hypertension/hypertensive emergency Presented with significantly elevated blood pressures with concern for press.  See above.  She was on amlodipine and metoprolol.   However after her bleeding episode from her fistula her blood pressures dropped.  Her amlodipine and metoprolol were held.   Subsequently blood pressure is noted to be in the hypertensive range.  Amlodipine and metoprolol resumed.  Blood pressure is better controlled.  Peripheral vascular disease/innominate vein stenting Stent in the innominate vein was placed in 2019.  Has had multiple angioplasties since then.  She was placed on Plavix at that time.  Now she is on Eliquis.  Discussed with Dr. Donzetta Matters about Plavix.  He is also reviewed the data.  She does not have any other indication for being on Plavix.  He recommends discontinuing it  since patient is now on Eliquis.  Plavix was discontinued.  Acute blood loss anemia/anemia of chronic disease Drop in hemoglobin was from bleeding from her fistula.  Transfused 2 units of PRBC on 12/6.  Hemoglobin improved and came up to 9. Stable for the most part.  Hypoglycemia Appears to have improved with improvement in oral intake.  Continue to monitor.     Nausea and vomiting Possibly related to dialysis as well as other acute issues.  Abdomen has been benign.  Resolved.  Thrombocytopenia Appears to have resolved.    Paroxysmal atrial fibrillation with rapid ventricular response She was noted to be bradycardic on 12/5-6.  Her metoprolol had to be held.  It was in the setting of acute bleeding anesthesia. Heart rate has stabilized.  Metoprolol was resumed which she is tolerating well.  Eliquis has been resumed as well  ESRD, on HD.  Being dialyzed on TTS schedule.  Nephrology is following.   HIV/oral thrush On antiretrovirals.  No oral thrush noted on examination today.  Completing 7 days of Diflucan.  We will stop after today.    Severe protein calorie malnutrition  Cleared for oral intake by speech therapy.  Was briefly on core track tube feeding.  Needs encouragement with oral intake.  GERD -PPI   Hyponatremia-volume managed with dialysis   DVT prophylaxis: Continue Eliquis CODE STATUS: DNR Family communication: No family at bedside Disposition: Skilled nursing facility when stable.  Hopefully early next week.  Status is: Inpatient  Remains inpatient appropriate because: Encephalopathy, seizure   Scheduled Meds:  amLODipine  10 mg Oral Daily   apixaban  2.5 mg Oral BID   calcitRIOL  1.25 mcg Oral Q T,Th,Sa-HD   calcium acetate (Phos Binder)  1,334 mg Oral TID WC   chlorhexidine  15 mL Mouth Rinse BID   Chlorhexidine Gluconate Cloth  6 each Topical Q0600   cinacalcet  60 mg Oral Q T,Th,Sa-HD   darbepoetin (ARANESP) injection - DIALYSIS  40 mcg Intravenous Q  Tue-HD   dolutegravir  50 mg Oral q1600   fluconazole  100 mg Oral Daily   lacosamide  150 mg Oral BID   lamiVUDine  50 mg Oral Daily   levETIRAcetam  1,000 mg Oral Daily   levETIRAcetam  250 mg Oral Q T,Th,Sat-1800   lidocaine-prilocaine  1 application Topical Q T,Th,Sa-HD   mouth rinse  15 mL Mouth Rinse q12n4p   metoprolol tartrate  25 mg Oral BID   mirtazapine  15 mg Oral QHS   nystatin  5 mL Oral QID   pantoprazole  40 mg Oral QHS   senna  2 tablet Oral QPM   thiamine  100 mg Oral Daily   vancomycin  500 mg Intravenous Q T,Th,Sa-HD   venlafaxine  75 mg Oral BID   zidovudine  100 mg Oral Q8H   Continuous Infusions:   PRN Meds:.acetaminophen **OR** acetaminophen, heparin sodium (porcine), hydrALAZINE, HYDROmorphone (DILAUDID) injection, lanolin/mineral oil, lidocaine, loperamide, LORazepam, ondansetron **OR** ondansetron (ZOFRAN) IV, ondansetron, oxyCODONE-acetaminophen, polyethylene glycol  Diet Orders (From admission, onward)     Start  Ordered   02/03/21 1600  Diet regular Room service appropriate? Yes with Assist; Fluid consistency: Thin  Diet effective now       Question Answer Comment  Room service appropriate? Yes with Assist   Fluid consistency: Thin      02/03/21 1600               Consultants:  PCCM Nephrology  Procedures:  As above  Microbiology  none  Antimicrobials: Vancomycin    Objective: Vitals:   02/08/21 1957 02/09/21 0001 02/09/21 0407 02/09/21 0801  BP: 128/81 115/74 126/81 134/85  Pulse: 70 65 71 82  Resp: 16 20 14    Temp: 98.3 F (36.8 C) 98.1 F (36.7 C) 98.3 F (36.8 C) 99 F (37.2 C)  TempSrc: Oral Axillary Axillary Oral  SpO2: 98% 98% 99% 98%  Weight:   37.1 kg   Height:        Intake/Output Summary (Last 24 hours) at 02/09/2021 0949 Last data filed at 02/09/2021 0407 Gross per 24 hour  Intake --  Output 3000 ml  Net -3000 ml    Filed Weights   02/08/21 0710 02/08/21 1107 02/09/21 0407  Weight: 40.2 kg  37.3 kg 37.1 kg    Examination:  General appearance: Awake alert.  In no distress Resp: Clear to auscultation bilaterally.  Normal effort Cardio: S1-S2 is normal regular.  No S3-S4.  No rubs murmurs or bruit GI: Abdomen is soft.  Nontender nondistended.  Bowel sounds are present normal.  No masses organomegaly Extremities: Wound VAC noted in the right upper extremity.  No bleeding noted from the incision site in the right lower extremity. Neurologic: Alert and oriented x3.  No focal neurological deficits.       Data Reviewed: I have independently reviewed following labs and imaging studies   CBC: Recent Labs  Lab 02/05/21 0151 02/06/21 0125 02/07/21 0126 02/08/21 0151 02/09/21 0224  WBC 7.0 8.6 7.8 9.4 8.2  HGB 9.3* 8.3* 7.9* 8.8* 9.5*  HCT 28.9* 26.3* 25.3* 28.0* 29.9*  MCV 92.0 93.9 94.4 95.9 94.3  PLT 125* 135* 137* 173 353    Basic Metabolic Panel: Recent Labs  Lab 02/03/21 0952 02/04/21 0332 02/06/21 0125 02/08/21 0151  NA 135 132* 132* 131*  K 4.2 5.9* 5.2* 5.0  CL 101 100 97* 96*  CO2  --  22 24 24   GLUCOSE 97 79 58* 96  BUN 18 28* 29* 25*  CREATININE 4.70* 5.93* 6.26* 5.40*  CALCIUM  --  8.5* 7.7* 7.5*  PHOS  --  4.3 3.4  --     Liver Function Tests: Recent Labs  Lab 02/04/21 0332 02/06/21 0125  ALBUMIN 2.4* 2.5*    CBG: Recent Labs  Lab 02/08/21 1653 02/08/21 2000 02/08/21 2359 02/09/21 0406 02/09/21 0842  GLUCAP 175* 89 194* 97 107*     Recent Results (from the past 240 hour(s))  Resp Panel by RT-PCR (Flu A&B, Covid) Nasopharyngeal Swab     Status: None   Collection Time: 02/03/21  5:59 PM   Specimen: Nasopharyngeal Swab; Nasopharyngeal(NP) swabs in vial transport medium  Result Value Ref Range Status   SARS Coronavirus 2 by RT PCR NEGATIVE NEGATIVE Final    Comment: (NOTE) SARS-CoV-2 target nucleic acids are NOT DETECTED.  The SARS-CoV-2 RNA is generally detectable in upper respiratory specimens during the acute phase of  infection. The lowest concentration of SARS-CoV-2 viral copies this assay can detect is 138 copies/mL. A negative result does not preclude SARS-Cov-2 infection  and should not be used as the sole basis for treatment or other patient management decisions. A negative result may occur with  improper specimen collection/handling, submission of specimen other than nasopharyngeal swab, presence of viral mutation(s) within the areas targeted by this assay, and inadequate number of viral copies(<138 copies/mL). A negative result must be combined with clinical observations, patient history, and epidemiological information. The expected result is Negative.  Fact Sheet for Patients:  EntrepreneurPulse.com.au  Fact Sheet for Healthcare Providers:  IncredibleEmployment.be  This test is no t yet approved or cleared by the [place] FDA and  has been authorized for detection and/or diagnosis of SARS-CoV-2 by FDA under an Emergency Use Authorization (EUA). This EUA will remain  in effect (meaning this test can be used) for the duration of the COVID-19 declaration under Section 564(b)(1) of the Act, 21 U.S.C.section 360bbb-3(b)(1), unless the authorization is terminated  or revoked sooner.       Influenza A by PCR NEGATIVE NEGATIVE Final   Influenza B by PCR NEGATIVE NEGATIVE Final    Comment: (NOTE) The Xpert Xpress SARS-CoV-2/FLU/RSV plus assay is intended as an aid in the diagnosis of influenza from Nasopharyngeal swab specimens and should not be used as a sole basis for treatment. Nasal washings and aspirates are unacceptable for Xpert Xpress SARS-CoV-2/FLU/RSV testing.  Fact Sheet for Patients: EntrepreneurPulse.com.au  Fact Sheet for Healthcare Providers: IncredibleEmployment.be  This test is not yet approved or cleared by the [place] FDA and has been authorized for detection and/or diagnosis of SARS-CoV-2  by FDA under an Emergency Use Authorization (EUA). This EUA will remain in effect (meaning this test can be used) for the duration of the COVID-19 declaration under Section 564(b)(1) of the Act, 21 U.S.C. section 360bbb-3(b)(1), unless the authorization is terminated or revoked.  Performed at [facility], [place] [address], [place], [place] [address]   Culture, blood (Routine X 2) w Reflex to ID Panel     Status: None (Preliminary result)   Collection Time: 02/08/21 11:34 AM   Specimen: BLOOD  Result Value Ref Range Status   Specimen Description BLOOD BLOOD LEFT HAND  Final   Special Requests   Final    BOTTLES DRAWN AEROBIC AND ANAEROBIC Blood Culture results may not be optimal due to an inadequate volume of blood received in culture bottles   Culture   Final    NO GROWTH < 24 HOURS Performed at [facility], [place] [address] [place], [place] [address]    Report Status PENDING  Incomplete  Culture, blood (Routine X 2) w Reflex to ID Panel     Status: None (Preliminary result)   Collection Time: 02/08/21 11:59 AM   Specimen: BLOOD  Result Value Ref Range Status   Specimen Description BLOOD BLOOD LEFT HAND  Final   Special Requests AEROBIC BOTTLE ONLY Blood Culture adequate volume  Final   Culture   Final    NO GROWTH < 24 HOURS Performed at [facility], [place] [address], [place], [place] [address]    Report Status PENDING  Incomplete      Radiology Studies: No results found.  Bonnielee Haff 02/09/2021  [facility]

## 2021-02-09 NOTE — Progress Notes (Signed)
[facility] NEPHROLOGY PROGRESS NOTE  Assessment/ Plan:  Outpatient HD orders:[name] TTS,EDW 41.5 kg,[place] 2K/2.5 ca,BF 350  DF 500,Time - 180 minutes Access catheter  Medications - out of hectorol and now on calcitriol - 1.25 mcg each tx. Sensipar 90 mg three times a week; Epogen 3600 units each tx, Heparin 1000 units bolus then 500 units per hour Has been on vanc 500 mg each tx (though misses often)   # Acute encephalopathy: EEG with moderate diffuse encephalopathy of nonspecific etiology.  Based on neurology evaluation appears to be consistent with PRES along with imaging showing no evidence of acute CVA.  Seen by neurology.  Mental status seems seems to be stable.  # ESRD: She is on outpatient TTS schedule,  Left IJ TDC for the access.  Status post HD yesterday with around 3 L ultrafiltration, tolerated well.  Plan for next HD on 12/13.  # Anemia: Without overt blood loss, continue ESA with HD.  Transfuse as needed.  No iron because of infection and on antibiotics.  # CKD-MBD: Noted hypocalcemia therefore I will reduce Sensipar to 60 mg.  Continue calcitriol and PhosLo.  Phosphorus level acceptable.  Continue to monitor lab.   #  Right radiocephalic fistula infection: Status post ligation and debridement on 10/28 with inconsistent outpatient antibiotic therapy due to missing hemodialysis or signing off early.  Restarted back on antibiotic therapy with vancomycin.  #Ruptured pseudoaneurysm of right arm fistula: Status post right brachial bypass with right greater saphenous vein for bleeding ruptured pseudoaneurysm.  Appreciate vascular consult.  # Hypertension: Blood pressures elevated.  She is currently on amlodipine and metoprolol.  I increased amlodipine to 10 mg.  Volume management with dialysis.    #Hypokalemia complicated by diarrhea.  Now becoming hyperkalemia.  Managed with dialysis.  #Acute febrile illness: Blood culture pending.  No fever overnight.  No  leukocytosis.  Continue to monitor.  Subjective: Seen and examined.  Tolerated dialysis well.  No new event.  Denies nausea, vomiting, chest pain, shortness of breath. Objective Vital signs in last 24 hours: Vitals:   02/08/21 1957 02/09/21 0001 02/09/21 0407 02/09/21 0801  BP: 128/81 115/74 126/81 134/85  Pulse: 70 65 71 82  Resp: 16 20 14    Temp: 98.3 F (36.8 C) 98.1 F (36.7 C) 98.3 F (36.8 C) 99 F (37.2 C)  TempSrc: Oral Axillary Axillary Oral  SpO2: 98% 98% 99% 98%  Weight:   37.1 kg   Height:       Weight change: -1 kg  Intake/Output Summary (Last 24 hours) at 02/09/2021 1012 Last data filed at 02/09/2021 0407 Gross per 24 hour  Intake --  Output 3000 ml  Net -3000 ml        Labs: Basic Metabolic Panel: Recent Labs  Lab 02/04/21 0332 02/06/21 0125 02/08/21 0151  NA 132* 132* 131*  K 5.9* 5.2* 5.0  CL 100 97* 96*  CO2 22 24 24   GLUCOSE 79 58* 96  BUN 28* 29* 25*  CREATININE 5.93* 6.26* 5.40*  CALCIUM 8.5* 7.7* 7.5*  PHOS 4.3 3.4  --     Liver Function Tests: Recent Labs  Lab 02/04/21 0332 02/06/21 0125  ALBUMIN 2.4* 2.5*    No results for input(s): LIPASE, AMYLASE in the last 168 hours. No results for input(s): AMMONIA in the last 168 hours.  CBC: Recent Labs  Lab 02/05/21 0151 02/06/21 0125 02/07/21 0126 02/08/21 0151 02/09/21 0224  WBC 7.0 8.6 7.8 9.4 8.2  HGB 9.3*  8.3* 7.9* 8.8* 9.5*  HCT 28.9* 26.3* 25.3* 28.0* 29.9*  MCV 92.0 93.9 94.4 95.9 94.3  PLT 125* 135* 137* 173 189    Cardiac Enzymes: No results for input(s): CKTOTAL, CKMB, CKMBINDEX, TROPONINI in the last 168 hours. CBG: Recent Labs  Lab 02/08/21 1653 02/08/21 2000 02/08/21 2359 02/09/21 0406 02/09/21 0842  GLUCAP 175* 89 194* 97 107*     Iron Studies: No results for input(s): IRON, TIBC, TRANSFERRIN, FERRITIN in the last 72 hours. Studies/Results: No results found.  Medications: Infusions:    Scheduled Medications:  amLODipine  10 mg Oral  Daily   apixaban  2.5 mg Oral BID   calcitRIOL  1.25 mcg Oral Q T,Th,Sa-HD   calcium acetate (Phos Binder)  1,334 mg Oral TID WC   chlorhexidine  15 mL Mouth Rinse BID   Chlorhexidine Gluconate Cloth  6 each Topical Q0600   cinacalcet  60 mg Oral Q T,Th,Sa-HD   darbepoetin (ARANESP) injection - DIALYSIS  40 mcg Intravenous Q Tue-HD   dolutegravir  50 mg Oral q1600   fluconazole  100 mg Oral Daily   lacosamide  150 mg Oral BID   lamiVUDine  50 mg Oral Daily   levETIRAcetam  1,000 mg Oral Daily   levETIRAcetam  250 mg Oral Q T,Th,Sat-1800   lidocaine-prilocaine  1 application Topical Q T,Th,Sa-HD   mouth rinse  15 mL Mouth Rinse q12n4p   metoprolol tartrate  25 mg Oral BID   mirtazapine  15 mg Oral QHS   nystatin  5 mL Oral QID   pantoprazole  40 mg Oral QHS   senna  2 tablet Oral QPM   thiamine  100 mg Oral Daily   vancomycin  500 mg Intravenous Q T,Th,Sa-HD   venlafaxine  75 mg Oral BID   zidovudine  100 mg Oral Q8H    have reviewed scheduled and prn medications.  Physical Exam: General: Able to lie flat, not in distress. Heart:RRR, s1s2 nl Lungs: Clear b/l, no crackle Abdomen:soft, Non-tender, non-distended Extremities: No peripheral edema. Dialysis Access: Left IJ TDC in place.  Gregary Blackard Prasad Maricella Filyaw 02/09/2021,10:12 AM  LOS: 17 days

## 2021-02-10 LAB — RESP PANEL BY RT-PCR (FLU A&B, COVID) ARPGX2
Influenza A by PCR: NEGATIVE
Influenza B by PCR: NEGATIVE
SARS Coronavirus 2 by RT PCR: NEGATIVE

## 2021-02-10 LAB — CBC
HCT: 30.2 % — ABNORMAL LOW (ref 36.0–46.0)
Hemoglobin: 9.6 g/dL — ABNORMAL LOW (ref 12.0–15.0)
MCH: 30 pg (ref 26.0–34.0)
MCHC: 31.8 g/dL (ref 30.0–36.0)
MCV: 94.4 fL (ref 80.0–100.0)
Platelets: 205 10*3/uL (ref 150–400)
RBC: 3.2 MIL/uL — ABNORMAL LOW (ref 3.87–5.11)
RDW: 20.5 % — ABNORMAL HIGH (ref 11.5–15.5)
WBC: 9.8 10*3/uL (ref 4.0–10.5)
nRBC: 0 % (ref 0.0–0.2)

## 2021-02-10 LAB — GLUCOSE, CAPILLARY
Glucose-Capillary: 85 mg/dL (ref 70–99)
Glucose-Capillary: 68 mg/dL — ABNORMAL LOW (ref 70–99)
Glucose-Capillary: 83 mg/dL (ref 70–99)
Glucose-Capillary: 89 mg/dL (ref 70–99)

## 2021-02-10 MED ORDER — CALCIUM ACETATE (PHOS BINDER) 667 MG PO CAPS
1334.0000 mg | ORAL_CAPSULE | Freq: Three times a day (TID) | ORAL | Status: DC
  Administered 2021-02-10: 1334 mg via ORAL
  Filled 2021-02-10 (×2): qty 2

## 2021-02-10 MED ORDER — CALCIUM ACETATE (PHOS BINDER) 667 MG PO CAPS: 1334.0000 mg | ORAL_CAPSULE | Freq: Three times a day (TID) | ORAL | Status: AC

## 2021-02-10 MED ORDER — DOXYCYCLINE HYCLATE 100 MG PO TABS: 100.0000 mg | ORAL_TABLET | Freq: Two times a day (BID) | ORAL | Status: DC

## 2021-02-10 MED ORDER — OXYCODONE-ACETAMINOPHEN 5-325 MG PO TABS: 1.0000 | ORAL_TABLET | Freq: Four times a day (QID) | ORAL | 0 refills | Status: DC | PRN

## 2021-02-10 MED ORDER — CHLORHEXIDINE GLUCONATE CLOTH 2 % EX PADS
6.0000 | MEDICATED_PAD | Freq: Every day | CUTANEOUS | Status: DC
  Administered 2021-02-10: 6 via TOPICAL

## 2021-02-10 NOTE — Progress Notes (Signed)
Occupational Therapy Treatment Patient Details Name: NITA WHITMIRE MRN: [id number] DOB: 04/05/1950 Today's Date: 02/10/2021   History of present illness 70 yo female admitted 01/23/21 after syncope at HD. 11/25 pt with HTN and seizure with LTM EEG. Pt with PRES. 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 PMhx: HTN, Aflutter, pulmonary HTN, HIV, GERD, chronic pain, ESRD on HD TTS.   OT comments  Pt educated on splint schedule, reports wearing splint yesterday but not since. Placed schedule in room and educated RN as well. Issued built up handles for utensils and practiced with self feeding using R UE, improved with wearing wrist cock up to support wrist.  Educated on wearing splint and using handles for meals, pt voiced understanding. Passive prolonged stretch and ROM exercises to wrist, hand and elbow as below.  Will follow acutely.    Recommendations for follow up therapy are one component of a multi-disciplinary discharge planning process, led by the attending physician.  Recommendations may be updated based on patient status, additional functional criteria and insurance authorization.    Follow Up Recommendations  Skilled nursing-short term rehab (<3 hours/day)    Assistance Recommended at Discharge Frequent or constant Supervision/Assistance  Equipment Recommendations  None recommended by OT    Recommendations for Other Services      Precautions / Restrictions Precautions Precautions: Fall Precaution Comments: wound vac right arm Restrictions Weight Bearing Restrictions: No       Mobility Bed Mobility                    Transfers                         Balance                                           ADL either performed or assessed with clinical judgement   ADL Overall ADL's : Needs assistance/impaired Eating/Feeding: Set up;Supervision/ safety;Bed level Eating/Feeding Details (indicate cue type  and reason): simulated using wrist cock up splint and red built up handles.  demonstrates increased control and coordination using R Hand, elbow ROM                                        Extremity/Trunk Assessment              Vision       Perception     Praxis      Cognition Arousal/Alertness: Awake/alert Behavior During Therapy: Flat affect Overall Cognitive Status: Impaired/Different from baseline Area of Impairment: Memory;Awareness;Problem solving                     Memory: Decreased short-term memory     Awareness: Emergent Problem Solving: Slow processing;Decreased initiation;Difficulty sequencing;Requires verbal cues General Comments: suspect near baseline cognition          Exercises Exercises: General Upper Extremity;Other exercises General Exercises - Upper Extremity Elbow Extension: AAROM;PROM;Right;5 reps (prolonged passive stretch) Wrist Extension: AAROM;PROM;Right;5 reps (prolonged passive stretch) Digit Composite Flexion: PROM;Right;10 reps;AROM;Strengthening (using small squeeze ball) Composite Extension: AAROM;PROM;Right;5 reps   Shoulder Instructions       General Comments      Pertinent Vitals/ Pain       Pain Assessment:  Faces Faces Pain Scale: Hurts even more Pain Location: R wrist/elbow with ROM Pain Descriptors / Indicators: Grimacing;Guarding Pain Intervention(s): Limited activity within patient's tolerance;Monitored during session;Repositioned  Home Living                                          Prior Functioning/Environment              Frequency  Min 3X/week        Progress Toward Goals  OT Goals(current goals can now be found in the care plan section)  Progress towards OT goals: Progressing toward goals  Acute Rehab OT Goals Patient Stated Goal: none stated OT Goal Formulation: With patient  Plan Discharge plan remains appropriate;Frequency remains appropriate     Co-evaluation                 AM-PAC OT "6 Clicks" Daily Activity     Outcome Measure   Help from another person eating meals?: A Little Help from another person taking care of personal grooming?: A Little Help from another person toileting, which includes using toliet, bedpan, or urinal?: A Lot Help from another person bathing (including washing, rinsing, drying)?: A Lot Help from another person to put on and taking off regular upper body clothing?: A Little Help from another person to put on and taking off regular lower body clothing?: A Lot 6 Click Score: 15    End of Session    OT Visit Diagnosis: Other abnormalities of gait and mobility (R26.89);Muscle weakness (generalized) (M62.81);Other symptoms and signs involving cognitive function;Pain Pain - Right/Left: Right Pain - part of body: Arm;Hand   Activity Tolerance Patient tolerated treatment well   Patient Left in bed;with call bell/phone within reach;with bed alarm set   Nurse Communication Mobility status;Other (comment) (splint schedule in room)        Time: 1030-1048 OT Time Calculation (min): 18 min  Charges: OT General Charges $OT Visit: 1 Visit OT Treatments $Self Care/Home Management : 8-22 mins  Jolaine Artist, OT [place] Pager [phone] Office [phone]   Delight Stare 02/10/2021, 11:43 AM

## 2021-02-10 NOTE — Progress Notes (Signed)
  Progress Note    02/10/2021 7:39 AM 7 Days Post-Op  Subjective:  no complaints this morning   Vitals:   02/09/21 2305 02/10/21 0410  BP: 132/88 125/76  Pulse: 80 77  Resp: 15 20  Temp: 97.9 F (36.6 C) 99 F (37.2 C)  SpO2: 100% 95%   Physical Exam: Lungs:  non labored Incisions:  R arm incisions with vac in place; R leg incision c/d/i Extremities: palpable R radial pulse Neurologic: A&O  CBC    Component Value Date/Time   WBC 9.8 02/10/2021 0709   RBC 3.20 (L) 02/10/2021 0709   HGB 9.6 (L) 02/10/2021 0709   HCT 30.2 (L) 02/10/2021 0709   PLT 205 02/10/2021 0709   MCV 94.4 02/10/2021 0709   MCH 30.0 02/10/2021 0709   MCHC 31.8 02/10/2021 0709   RDW 20.5 (H) 02/10/2021 0709   LYMPHSABS 0.7 01/24/2021 1331   MONOABS 0.2 01/24/2021 1331   EOSABS 0.0 01/24/2021 1331   BASOSABS 0.0 01/24/2021 1331    BMET    Component Value Date/Time   NA 131 (L) 02/08/2021 0151   K 5.0 02/08/2021 0151   CL 96 (L) 02/08/2021 0151   CO2 24 02/08/2021 0151   GLUCOSE 96 02/08/2021 0151   BUN 25 (H) 02/08/2021 0151   CREATININE 5.40 (H) 02/08/2021 0151   CREATININE 5.66 (H) 12/22/2019 1117   CALCIUM 7.5 (L) 02/08/2021 0151   CALCIUM 9.0 03/12/2011 0527   GFRNONAA 8 (L) 02/08/2021 0151   GFRNONAA 5 (L) 02/12/2015 1000   GFRAA 9 (L) 10/22/2019 0105   GFRAA 5 (L) 02/12/2015 1000    INR    Component Value Date/Time   INR 1.2 01/24/2021 1054     Intake/Output Summary (Last 24 hours) at 02/10/2021 0739 Last data filed at 02/10/2021 0411 Gross per 24 hour  Intake 100 ml  Output 0 ml  Net 100 ml     Assessment/Plan:  70 y.o. female is s/p R arm bypass and repair of brachial artery 7 Days Post-Op   R hand well perfused with palpable radial pulse R leg vein harvest incision healing well Vac change today Ok for discharge back to SNF   Dagoberto Ligas, PA-C [facility] [phone] 02/10/2021 7:39 AM

## 2021-02-10 NOTE — TOC Transition Note (Signed)
Transition of Care [facility]) - CM/SW Discharge Note   Patient Details  Name: [name] MRN: [id number] Date of Birth: 26-Mar-1950  Transition of Care [facility]) CM/SW Contact:  Emeterio Reeve, LCSW Phone Number: 02/10/2021, 12:14 PM   Clinical Narrative:      Per MD patient ready for DC to [facility]. RN, patient, patient's family, and facility notified of DC. Discharge Summary and FL2 sent to facility. DC packet on chart. Pt is covid negative. Ambulance transport requested for patient.    RN to call report to [phone].  CSW will sign off for now as social work intervention is no longer needed. Please consult [place] again if new needs arise.   Final next level of care: Skilled Nursing Facility Barriers to Discharge: Barriers Resolved   Patient Goals and CMS Choice Patient states their goals for this hospitalization and ongoing recovery are:: Patient is LTC at [facility] [place]      Discharge Placement              Patient chooses bed at: [facility] Patient to be transferred to facility by: [facility] Name of family member notified: daughter, Audreyanna Butkiewicz Patient and family notified of of transfer: 02/10/21  Discharge Plan and Services     Post Acute Care Choice: [place]                               Social Determinants of Health (SDOH) Interventions     Readmission Risk Interventions Readmission Risk Prevention Plan 10/22/2019  Transportation Screening Complete  PCP or Specialist Appt within 3-5 Days Not Complete  Not Complete comments SNF MD to follow  [place] or [place] Not Complete  HRI or Home Care Consult comments Pt resides in a SNF  Social Work Consult for [place] Planning/Counseling Complete  Palliative Care Screening Not Applicable  Medication Review Press photographer) Complete  Some recent data might be hidden    Emeterio Reeve, LCSW Clinical Social Worker

## 2021-02-10 NOTE — Progress Notes (Signed)
D/C order noted. Contacted [facility] and spoke to [place]. Clinic advised of pt's d/c today and pt will resume on Wednesday. Clinic advised that d/c summary contains info regarding iv vancomycin with HD. D/C summary faxed to clinic for continuation of care.   Melven Sartorius Renal Navigator [phone]

## 2021-02-10 NOTE — Progress Notes (Signed)
Pharmacy Antibiotic Note Pharmacy Consult for vancomycin  Indication:  MRSA infection of AV fistula    Height: 5\' 2"  (157.5 cm) Weight: 37.9 kg (83 lb 8.9 oz) IBW/kg (Calculated) : 50.1  Temp (24hrs), Avg:98.9 F (37.2 C), Min:97.9 F (36.6 C), Max:99.4 F (37.4 C)  Recent Labs  Lab 02/04/21 0332 02/04/21 1800 02/06/21 0125 02/07/21 0126 02/08/21 0151 02/09/21 0224 02/10/21 0709  WBC 6.8   < > 8.6 7.8 9.4 8.2 9.8  CREATININE 5.93*  --  6.26*  --  5.40*  --   --   VANCORANDOM 16  --   --   --   --   --   --    < > = values in this interval not displayed.     Estimated Creatinine Clearance: 6 mL/min (A) (by C-G formula based on SCr of 5.4 mg/dL (H)).    Allergies  Allergen Reactions   Lactose Intolerance (Gi) Other (See Comments)    On MAR   Latex Rash and Itching   Penicillins Rash and Swelling    Has patient had a PCN reaction causing immediate rash, facial/tongue/throat swelling, SOB or lightheadedness with hypotension: No Has patient had a PCN reaction causing severe rash involving mucus membranes or skin necrosis: No Has patient had a PCN reaction that required hospitalization No Has patient had a PCN reaction occurring within the last 10 years: No If all of the above answers are "NO", then may proceed with Cephalosporin use.      Assessment:  70yo female admitted on 01/23/2021 with MRSA infection of right AV fistula on vancomycin PTA with plan to continue to 12/14 (started 10/28, for 6 weeks).  Patient gets dialysis TTSa but has received inconsistent outpatient antibiotic therapy due to missing hemodialysis or ending session early.  Pharmacy was consulted on 01/27/21 for vancomycin dosing.  Continues on Vancomycin 500 mg IV TTS with HD.   Continues on scheduled TTS HD, currently in HD for dialysis now.  Vancomycin course planned to end Wednesday 12/14 (total of 7 weeks).  Last dose to be given 12/13 after HD.  Per Dr. Storm Frisk ID note from 11/3, pt will need  chronic suppression with po doxycycline after vancomycin finishes.  Antimicrobials this admission: Vancomycin 10/28  >> 12/14    Vanc levels/ Dose adjustments this admission: 11/28 Vanc Random level =18 (on vancomycin 500 mg with TTSa  12/6  Vanc Random level = 16 mcg/ml on vanc 500mg  qHD-TTSa.  Microbiology results: 11/25 BCx x2: negative 11/26 MRSA PCR: neg  11/26 CSF cx: negative 10/28 Arm abscess + MRSA  12/5 SARS Covid PCR: neg: flu A/B: neg   Plan:  Then continue vancomycin 500 mg IV q TTS after HD. End date for IV Vanc planned 02/12/21, last dose will be given with HD next Tues on 02/11/21 , thus no further levels planned. Doxycycline 100 mg BID to start Thursday 12/15.  [place] Crane, Roylene Reason, BCCP Clinical Pharmacist  02/10/2021 11:18 AM   [facility] pharmacy phone numbers are listed on [email or web address]

## 2021-02-10 NOTE — Progress Notes (Signed)
PROGRESS NOTE  [name] TGY:[id number] DOB: 03/22/1950 DOA: 01/23/2021 PCP: Hilbert Corrigan, MD   LOS: 18 days    Brief Narrative / Interim history: 70 year old female who has a history of a flutter on Eliquis, pulmonary hypertension, HIV, GERD, ESRD on TTS HD, fistula infection on vent treatment with HD as an outpatient but not consistently administered due to early termination of her HD, was brought to [facility] on 11/24 due to a syncopal episode while undergoing HD.  She also had intermittent confusion at that time.  After admission, on 11/25 became hypertensive, had a seizure and neurology was consulted.  An MRI of the brain was concerning for press.  She was started on Cardene drip and she was transferred to [facility] for ICU management and continuous EEG.  Her mental status continues to improve.  On 11/28 failed bedside speech eval and core track was placed.  On 11/29 her continuous EEG has been discontinued, and with continued improvement she was transferred to the hospitalist service on 12/1.  Nephrology also following for her dialysis needs.   Subjective: Patient feels well.  Pain in the right arm continues to be present but better than before.  States that pain medications do help.  Denies any other complaints.       Assessment & Plan:  Acute encephalopathy with PRES mentation seems to be back to baseline. Patient had a seizure episode in the setting of hypertension.  MRI showed cortical and subcortical edema in the occipital parietal lobes bilaterally.  She was subsequently admitted to the ICU, improved and transferred to the hospitalist service on 12/1.  Mentation remains stable.  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  EEG initially showed nonconvulsive status epilepticus.  She underwent continuous EEG monitoring.   Currently on Vimpat and Keppra.   Has been seizure-free for the last several days.  MRSA infection of the  right AV fistula She was hospitalized 10/25 - 01/10/19/2022 with this, status post incision and drainage and ligation of the AV fistula on 10/28.   ID was consulted and recommended vancomycin with dialysis for total of 6 weeks with a last day 02/12/2021.   After which patient has to be transitioned to doxycycline chronically for suppression.  Low-grade fever Noted to have a temperature of 100.7 on 12/10.  Blood cultures negative so far.  No further episodes of fever.  WBC has been normal.    Pseudoaneurysm rupture right arm fistula Developed bleeding from her fistula on 12/5.  Had to be taken to the OR emergently.  Underwent control of hemorrhage and creation of a right brachial to brachial bypass.  Had a lot of bleeding from her saphenous vein harvesting site in the right lower extremity as well as from the right upper extremity.  This is likely because patient was on Plavix and Eliquis.  These medications were held. Bleeding appears to have subsided.  Vascular surgery continues to follow.  Has a wound VAC on the right upper extremity. Eliquis was resumed on 12/7.  A.m. hemoglobin is stable.  Essential hypertension/hypertensive emergency Presented with significantly elevated blood pressures with concern for press.  See above.  She was on amlodipine and metoprolol.   However after her bleeding episode from her fistula her blood pressures dropped.  Her amlodipine and metoprolol were held.   Subsequently blood pressure is noted to be in the hypertensive range.  Amlodipine and metoprolol resumed.  Blood pressure is better controlled.  Peripheral vascular disease/innominate vein stenting Stent in the innominate vein was placed in 2019.  Has had multiple angioplasties since then.  She was placed on Plavix at that time.  Now she is on Eliquis.  Discussed with Dr. Donzetta Matters about Plavix.  He is also reviewed the data.  She does not have any other indication for being on Plavix.  He recommends discontinuing  it since patient is now on Eliquis.  Plavix was discontinued.  Acute blood loss anemia/anemia of chronic disease Drop in hemoglobin was from bleeding from her fistula.  Transfused 2 units of PRBC on 12/6.  Hemoglobin responded appropriately and stable for the last several days.  Hypoglycemia Appears to have improved with improvement in oral intake.  Continue to encourage meal intake.     Nausea and vomiting Possibly related to dialysis as well as other acute issues.  Abdomen has been benign.  Resolved.  Thrombocytopenia Appears to have resolved.    Paroxysmal atrial fibrillation with rapid ventricular response She was noted to be bradycardic on 12/5-6.  Her metoprolol had to be held.  It was in the setting of acute bleeding anesthesia. Heart rate has stabilized.  Metoprolol was resumed which she is tolerating well.  Eliquis has been resumed as well  ESRD, on HD.  Being dialyzed on TTS schedule.  Nephrology is following.   HIV/oral thrush On antiretrovirals.  Oral thrush was treated with 7 days of Diflucan.   Severe protein calorie malnutrition  Was briefly on core track tube feeding.  Needs encouragement with oral intake.  GERD -PPI   Hyponatremia-volume managed with dialysis   DVT prophylaxis: Continue Eliquis CODE STATUS: DNR Family communication: No family at bedside Disposition: Ready for discharge back to SNF.  Status is: Inpatient  Remains inpatient appropriate because: Encephalopathy, seizure   Scheduled Meds:  amLODipine  10 mg Oral Daily   apixaban  2.5 mg Oral BID   calcitRIOL  1.25 mcg Oral Q T,Th,Sa-HD   calcium acetate  1,334 mg Oral TID WC   chlorhexidine  15 mL Mouth Rinse BID   Chlorhexidine Gluconate Cloth  6 each Topical Q0600   cinacalcet  60 mg Oral Q T,Th,Sa-HD   darbepoetin (ARANESP) injection - DIALYSIS  40 mcg Intravenous Q Tue-HD   dolutegravir  50 mg Oral q1600   [START ON 02/13/2021] doxycycline  100 mg Oral Q12H   lacosamide  150 mg  Oral BID   lamiVUDine  50 mg Oral Daily   levETIRAcetam  1,000 mg Oral Daily   levETIRAcetam  250 mg Oral Q T,Th,Sat-1800   lidocaine-prilocaine  1 application Topical Q T,Th,Sa-HD   mouth rinse  15 mL Mouth Rinse q12n4p   metoprolol tartrate  25 mg Oral BID   mirtazapine  15 mg Oral QHS   nystatin  5 mL Oral QID   pantoprazole  40 mg Oral QHS   senna  2 tablet Oral QPM   thiamine  100 mg Oral Daily   vancomycin  500 mg Intravenous Q T,Th,Sa-HD   venlafaxine  75 mg Oral BID   zidovudine  100 mg Oral Q8H   Continuous Infusions:   PRN Meds:.acetaminophen **OR** acetaminophen, heparin sodium (porcine), hydrALAZINE, HYDROmorphone (DILAUDID) injection, lanolin/mineral oil, lidocaine, loperamide, LORazepam, ondansetron **OR** ondansetron (ZOFRAN) IV, ondansetron, oxyCODONE-acetaminophen, polyethylene glycol  Diet Orders (From admission, onward)     Start     Ordered   02/03/21 1600  Diet regular Room service appropriate? Yes with Assist; Fluid consistency: Thin  Diet effective now  Question Answer Comment  Room service appropriate? Yes with Assist   Fluid consistency: Thin      02/03/21 1600               Consultants:  PCCM Nephrology  Procedures:  As above  Microbiology  none  Antimicrobials: Vancomycin    Objective: Vitals:   02/09/21 2305 02/10/21 0410 02/10/21 0456 02/10/21 0752  BP: 132/88 125/76  132/85  Pulse: 80 77  82  Resp: 15 20  20   Temp: 97.9 F (36.6 C) 99 F (37.2 C)  99.2 F (37.3 C)  TempSrc: Oral Oral  Oral  SpO2: 100% 95%  97%  Weight:   37.9 kg   Height:        Intake/Output Summary (Last 24 hours) at 02/10/2021 1122 Last data filed at 02/10/2021 0900 Gross per 24 hour  Intake 250 ml  Output 0 ml  Net 250 ml    Filed Weights   02/08/21 1107 02/09/21 0407 02/10/21 0456  Weight: 37.3 kg 37.1 kg 37.9 kg    Examination:  General appearance: Awake alert.  In no distress Resp: Clear to auscultation bilaterally.  Normal  effort Cardio: S1-S2 is normal regular.  No S3-S4.  No rubs murmurs or bruit GI: Abdomen is soft.  Nontender nondistended.  Bowel sounds are present normal.  No masses organomegaly Extremities: Good pulses right upper extremity.  Wound VAC noted.  No bleeding appreciated.  No bleeding from the right leg either. Neurologic:   No focal neurological deficits.      Data Reviewed: I have independently reviewed following labs and imaging studies   CBC: Recent Labs  Lab 02/06/21 0125 02/07/21 0126 02/08/21 0151 02/09/21 0224 02/10/21 0709  WBC 8.6 7.8 9.4 8.2 9.8  HGB 8.3* 7.9* 8.8* 9.5* 9.6*  HCT 26.3* 25.3* 28.0* 29.9* 30.2*  MCV 93.9 94.4 95.9 94.3 94.4  PLT 135* 137* 173 189 063    Basic Metabolic Panel: Recent Labs  Lab 02/04/21 0332 02/06/21 0125 02/08/21 0151  NA 132* 132* 131*  K 5.9* 5.2* 5.0  CL 100 97* 96*  CO2 22 24 24   GLUCOSE 79 58* 96  BUN 28* 29* 25*  CREATININE 5.93* 6.26* 5.40*  CALCIUM 8.5* 7.7* 7.5*  PHOS 4.3 3.4  --     Liver Function Tests: Recent Labs  Lab 02/04/21 0332 02/06/21 0125  ALBUMIN 2.4* 2.5*    CBG: Recent Labs  Lab 02/09/21 1737 02/09/21 2044 02/09/21 2349 02/10/21 0408 02/10/21 0822  GLUCAP 183* 97 127* 85 68*     Recent Results (from the past 240 hour(s))  Resp Panel by RT-PCR (Flu A&B, Covid) Nasopharyngeal Swab     Status: None   Collection Time: 02/03/21  5:59 PM   Specimen: Nasopharyngeal Swab; Nasopharyngeal(NP) swabs in vial transport medium  Result Value Ref Range Status   SARS Coronavirus 2 by RT PCR NEGATIVE NEGATIVE Final    Comment: (NOTE) SARS-CoV-2 target nucleic acids are NOT DETECTED.  The SARS-CoV-2 RNA is generally detectable in upper respiratory specimens during the acute phase of infection. The lowest concentration of SARS-CoV-2 viral copies this assay can detect is 138 copies/mL. A negative result does not preclude SARS-Cov-2 infection and should not be used as the sole basis for treatment  or other patient management decisions. A negative result may occur with  improper specimen collection/handling, submission of specimen other than nasopharyngeal swab, presence of viral mutation(s) within the areas targeted by this assay, and inadequate number of viral copies(<138  copies/mL). A negative result must be combined with clinical observations, patient history, and epidemiological information. The expected result is Negative.  Fact Sheet for Patients:  EntrepreneurPulse.com.au  Fact Sheet for Healthcare Providers:  IncredibleEmployment.be  This test is no t yet approved or cleared by the [place] FDA and  has been authorized for detection and/or diagnosis of SARS-CoV-2 by FDA under an Emergency Use Authorization (EUA). This EUA will remain  in effect (meaning this test can be used) for the duration of the COVID-19 declaration under Section 564(b)(1) of the Act, 21 U.S.C.section 360bbb-3(b)(1), unless the authorization is terminated  or revoked sooner.       Influenza A by PCR NEGATIVE NEGATIVE Final   Influenza B by PCR NEGATIVE NEGATIVE Final    Comment: (NOTE) The Xpert Xpress SARS-CoV-2/FLU/RSV plus assay is intended as an aid in the diagnosis of influenza from Nasopharyngeal swab specimens and should not be used as a sole basis for treatment. Nasal washings and aspirates are unacceptable for Xpert Xpress SARS-CoV-2/FLU/RSV testing.  Fact Sheet for Patients: EntrepreneurPulse.com.au  Fact Sheet for Healthcare Providers: IncredibleEmployment.be  This test is not yet approved or cleared by the [place] FDA and has been authorized for detection and/or diagnosis of SARS-CoV-2 by FDA under an Emergency Use Authorization (EUA). This EUA will remain in effect (meaning this test can be used) for the duration of the COVID-19 declaration under Section 564(b)(1) of the Act, 21 U.S.C. section  360bbb-3(b)(1), unless the authorization is terminated or revoked.  Performed at [facility], [place] [address], [place], [place] [address]   Culture, blood (Routine X 2) w Reflex to ID Panel     Status: None (Preliminary result)   Collection Time: 02/08/21 11:34 AM   Specimen: BLOOD  Result Value Ref Range Status   Specimen Description BLOOD BLOOD LEFT HAND  Final   Special Requests   Final    BOTTLES DRAWN AEROBIC AND ANAEROBIC Blood Culture results may not be optimal due to an inadequate volume of blood received in culture bottles   Culture   Final    NO GROWTH 2 DAYS Performed at [facility], [place] [address] [place], [place] [address]    Report Status PENDING  Incomplete  Culture, blood (Routine X 2) w Reflex to ID Panel     Status: None (Preliminary result)   Collection Time: 02/08/21 11:59 AM   Specimen: BLOOD  Result Value Ref Range Status   Specimen Description BLOOD BLOOD LEFT HAND  Final   Special Requests AEROBIC BOTTLE ONLY Blood Culture adequate volume  Final   Culture   Final    NO GROWTH 2 DAYS Performed at [facility], [place] [address] [place], [place] [address]    Report Status PENDING  Incomplete      Radiology Studies: No results found.  Bonnielee Haff 02/10/2021  [facility]

## 2021-02-10 NOTE — Progress Notes (Signed)
Called report to sandy, Therapist, sports at [employer].   Lavenia Atlas, RN

## 2021-02-10 NOTE — Discharge Summary (Signed)
[facility]  Physician Discharge Summary   Patient ID: [name] MRN: [id number] DOB/AGE: 1950/04/21 70 y.o.  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  Can be checked with dialysis on Thursday Last dose of vancomycin is on 12/13 with dialysis following which she will be on doxycycline starting Thursday, 12/15    Home Health: Going to SNF Equipment/Devices: None  CODE STATUS: DNR  DISCHARGE CONDITION: fair  Diet recommendation: Regular as tolerated  INITIAL HISTORY: 70 year old female who has a history of a flutter on Eliquis, pulmonary hypertension, HIV, GERD, ESRD on TTS HD, fistula infection on vent treatment with HD as an outpatient but not consistently administered due to early termination of her HD, was brought to [facility] on 11/24 due to a syncopal episode while undergoing HD.  She also had intermittent confusion at that time.  After admission, on 11/25 became hypertensive, had a seizure and neurology was consulted.  An MRI of the brain was concerning for press.  She was started on Cardene drip and she was transferred to [facility] for ICU management and continuous EEG.  Her mental status continues to improve.  On 11/28 failed bedside speech eval and core track was placed.  On 11/29 her continuous EEG has been discontinued, and with continued improvement she was transferred to the hospitalist service on 12/1.   Nephrology also following for her dialysis needs.    Consultations: Nephrology Vascular surgery Critical care medicine  Procedures: PROCEDURE:   1) re-do right brachial exposure and control of hemorrhage 2) harvest right greater saphenous vein 3) right brachial-brachial bypass (end-to-end, reversed greater saphenous vein, subfascial) 4) application of skin substitute to right upper extremity (Myriad 7x10cm)    HOSPITAL COURSE:   Acute encephalopathy with PRES mentation seems to be back to baseline. Patient had a seizure episode in the setting of hypertension.  MRI showed cortical and subcortical edema in the occipital parietal lobes bilaterally.  She was subsequently admitted to the ICU, improved and transferred to the hospitalist service on 12/1.  Mentation remains stable.   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  EEG initially showed nonconvulsive status epilepticus.  She underwent continuous EEG monitoring.   Currently on Vimpat and Keppra.   Has been seizure-free for the last several days.   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   ID was consulted and recommended vancomycin with dialysis for total of 6 weeks with a last day 02/12/2021.   After which patient has to be transitioned to doxycycline chronically for suppression.   Low-grade fever Noted to have a temperature of 100.7 on 12/10.  Blood cultures negative so far.  No further episodes of fever.  WBC has been normal.  No further work-up at this time.  Low-grade fever could have been related to recent surgery.   Pseudoaneurysm rupture right arm fistula Developed bleeding from her fistula on 12/5.  Had to  be taken to the OR emergently.  Underwent control of hemorrhage and creation of a right brachial to brachial bypass.  Had a lot of bleeding from her saphenous vein harvesting  site in the right lower extremity as well as from the right upper extremity.  This is likely because patient was on Plavix and Eliquis.  These medications were held. Bleeding appears to have subsided.  Vascular surgery continues to follow.  Has a wound VAC on the right upper extremity. Eliquis was resumed on 12/7.  Hemoglobin has been stable.   Essential hypertension/hypertensive emergency Presented with significantly elevated blood pressures with concern for press.  See above.  She was on amlodipine and metoprolol.   However after her bleeding episode from her fistula her blood pressures dropped.  Her amlodipine and metoprolol were held.   Subsequently blood pressure is noted to be in the hypertensive range.  Amlodipine and metoprolol resumed.  Blood pressure is better controlled.     Peripheral vascular disease/innominate vein stenting Stent in the innominate vein was placed in 2019.  Has had multiple angioplasties since then.  She was placed on Plavix at that time.  Now she is on Eliquis.  Discussed with Dr. Donzetta Matters about Plavix.  He is also reviewed the data.  She does not have any other indication for being on Plavix.  He recommends discontinuing it since patient is now on Eliquis.  Plavix was discontinued.   Acute blood loss anemia/anemia of chronic disease Drop in hemoglobin was from bleeding from her fistula.  Transfused 2 units of PRBC on 12/6.  Hemoglobin responded appropriately and stable for the last several days.   Hypoglycemia Appears to have improved with improvement in oral intake.  Continue to encourage meal intake.      Nausea and vomiting Possibly related to dialysis as well as other acute issues.  Abdomen has been benign.  Resolved.   Thrombocytopenia Appears to have resolved.     Paroxysmal atrial fibrillation with rapid ventricular response She was noted to be bradycardic on 12/5-6.  Her metoprolol had to be held.  It was in the setting of acute bleeding anesthesia. Heart  rate has stabilized.  Metoprolol was resumed which she is tolerating well.  Eliquis has been resumed as well   ESRD, on HD.  Being dialyzed on TTS schedule.  Nephrology is following.   HIV/oral thrush On antiretrovirals.  Oral thrush was treated with 7 days of Diflucan.   Severe protein calorie malnutrition  Was briefly on core track tube feeding.  Needs encouragement with oral intake.  GERD -PPI   Hyponatremia-volume managed with dialysis   Patient is stable.  Okay for discharge back to SNF today.  We will recheck her CBG prior to discharge.  Wound care instructions and instructions regarding wound vac to be provided by vascular surgery.  PERTINENT LABS:  The results of significant diagnostics from this hospitalization (including imaging, microbiology, ancillary and laboratory) are listed below for reference.    Microbiology: Recent Results (from the past 240 hour(s))  Resp Panel by RT-PCR (Flu A&B, Covid) Nasopharyngeal Swab     Status: None   Collection Time: 02/03/21  5:59 PM   Specimen: Nasopharyngeal Swab; Nasopharyngeal(NP) swabs in vial transport medium  Result Value Ref Range Status   SARS Coronavirus 2 by RT PCR NEGATIVE NEGATIVE Final    Comment: (NOTE) SARS-CoV-2 target nucleic acids are NOT DETECTED.  The SARS-CoV-2 RNA is generally detectable in upper respiratory specimens during the acute phase of infection.  The lowest concentration of SARS-CoV-2 viral copies this assay can detect is 138 copies/mL. A negative result does not preclude SARS-Cov-2 infection and should not be used as the sole basis for treatment or other patient management decisions. A negative result may occur with  improper specimen collection/handling, submission of specimen other than nasopharyngeal swab, presence of viral mutation(s) within the areas targeted by this assay, and inadequate number of viral copies(<138 copies/mL). A negative result must be combined with clinical observations,  patient history, and epidemiological information. The expected result is Negative.  Fact Sheet for Patients:  EntrepreneurPulse.com.au  Fact Sheet for Healthcare Providers:  IncredibleEmployment.be  This test is no t yet approved or cleared by the [place] FDA and  has been authorized for detection and/or diagnosis of SARS-CoV-2 by FDA under an Emergency Use Authorization (EUA). This EUA will remain  in effect (meaning this test can be used) for the duration of the COVID-19 declaration under Section 564(b)(1) of the Act, 21 U.S.C.section 360bbb-3(b)(1), unless the authorization is terminated  or revoked sooner.       Influenza A by PCR NEGATIVE NEGATIVE Final   Influenza B by PCR NEGATIVE NEGATIVE Final    Comment: (NOTE) The Xpert Xpress SARS-CoV-2/FLU/RSV plus assay is intended as an aid in the diagnosis of influenza from Nasopharyngeal swab specimens and should not be used as a sole basis for treatment. Nasal washings and aspirates are unacceptable for Xpert Xpress SARS-CoV-2/FLU/RSV testing.  Fact Sheet for Patients: EntrepreneurPulse.com.au  Fact Sheet for Healthcare Providers: IncredibleEmployment.be  This test is not yet approved or cleared by the [place] FDA and has been authorized for detection and/or diagnosis of SARS-CoV-2 by FDA under an Emergency Use Authorization (EUA). This EUA will remain in effect (meaning this test can be used) for the duration of the COVID-19 declaration under Section 564(b)(1) of the Act, 21 U.S.C. section 360bbb-3(b)(1), unless the authorization is terminated or revoked.  Performed at [facility], [place] [address] [place], [place] [address]   Culture, blood (Routine X 2) w Reflex to ID Panel     Status: None (Preliminary result)   Collection Time: 02/08/21 11:34 AM   Specimen: BLOOD  Result Value Ref Range Status   Specimen Description BLOOD  BLOOD LEFT HAND  Final   Special Requests   Final    BOTTLES DRAWN AEROBIC AND ANAEROBIC Blood Culture results may not be optimal due to an inadequate volume of blood received in culture bottles   Culture   Final    NO GROWTH 2 DAYS Performed at [facility], [place] [address], [place], [place] [address]    Report Status PENDING  Incomplete  Culture, blood (Routine X 2) w Reflex to ID Panel     Status: None (Preliminary result)   Collection Time: 02/08/21 11:59 AM   Specimen: BLOOD  Result Value Ref Range Status   Specimen Description BLOOD BLOOD LEFT HAND  Final   Special Requests AEROBIC BOTTLE ONLY Blood Culture adequate volume  Final   Culture   Final    NO GROWTH 2 DAYS Performed at [facility], [place] [address] [place], [place] [address]    Report Status PENDING  Incomplete     Labs:  [id number] Labs   Lab Results  Component Value Date   SARSCOV2NAA NEGATIVE 02/03/2021   SARSCOV2NAA NEGATIVE 01/23/2021   [place] NEGATIVE 12/24/2020   [place] NEGATIVE 11/26/2020      Basic Metabolic Panel: Recent Labs  Lab  02/04/21 0332 02/06/21 0125 02/08/21 0151  NA 132* 132* 131*  K 5.9* 5.2* 5.0  CL 100 97* 96*  CO2 '22 24 24  ' GLUCOSE 79 58* 96  BUN 28* 29* 25*  CREATININE 5.93* 6.26* 5.40*  CALCIUM 8.5* 7.7* 7.5*  PHOS 4.3 3.4  --    Liver Function Tests: Recent Labs  Lab 02/04/21 0332 02/06/21 0125  ALBUMIN 2.4* 2.5*    CBC: Recent Labs  Lab 02/06/21 0125 02/07/21 0126 02/08/21 0151 02/09/21 0224 02/10/21 0709  WBC 8.6 7.8 9.4 8.2 9.8  HGB 8.3* 7.9* 8.8* 9.5* 9.6*  HCT 26.3* 25.3* 28.0* 29.9* 30.2*  MCV 93.9 94.4 95.9 94.3 94.4  PLT 135* 137* 173 189 205    CBG: Recent Labs  Lab 02/09/21 1737 02/09/21 2044 02/09/21 2349 02/10/21 0408 02/10/21 0822  GLUCAP 183* 97 127* 85 68*     IMAGING STUDIES DG Abd 1 View  Result Date: 01/28/2021 CLINICAL DATA:  Feeding tube placement EXAM: ABDOMEN - 1 VIEW COMPARISON:  Portable  exam 1018 hours compared to 01/27/2021 FINDINGS: Tip of feeding tube projects over distal gastric antrum/pylorus. Nonobstructive bowel gas pattern. Bones demineralized. Lung bases clear. IMPRESSION: Tip of feeding tube projects over distal gastric antrum/pylorus. Electronically Signed   By: Lavonia Dana M.D.   On: 01/28/2021 13:13   CT HEAD WO CONTRAST (5MM)  Result Date: 01/24/2021 CLINICAL DATA:  Possible seizure prior to arrival, near syncope during dialysis. EXAM: CT HEAD WITHOUT CONTRAST TECHNIQUE: Contiguous axial images were obtained from the base of the skull through the vertex without intravenous contrast. COMPARISON:  01/23/2021. FINDINGS: Brain: No evidence of acute infarction, hemorrhage, cerebral edema, mass, mass effect, or midline shift. No hydrocephalus or extra-axial fluid collection. Periventricular white matter changes, likely the sequela of chronic small vessel ischemic disease. Vascular: No hyperdense vessel. Atherosclerotic calcifications in the intracranial carotid and vertebral arteries. Skull: Renal osteodystrophy.  Negative for fracture or focal lesion. Sinuses/Orbits: No acute finding. Other: The mastoid air cells are well aerated. IMPRESSION: No acute intracranial process. Electronically Signed   By: Merilyn Baba M.D.   On: 01/24/2021 02:13   CT Head Wo Contrast  Result Date: 01/23/2021 CLINICAL DATA:  Mental status changes of unknown cause in a 70 year old female. EXAM: CT HEAD WITHOUT CONTRAST TECHNIQUE: Contiguous axial images were obtained from the base of the skull through the vertex without intravenous contrast. COMPARISON:  April 07, 2020. FINDINGS: Brain: No evidence of acute infarction, hemorrhage, hydrocephalus, extra-axial collection or mass lesion/mass effect. Signs of atrophy and chronic microvascular ischemic change as before. Vascular: No hyperdense vessel or unexpected calcification. Skull: Normal. Negative for fracture or focal lesion. Signs of renal  osteodystrophy. Sinuses/Orbits: Visualized paranasal sinuses and orbits without acute process. Other: None IMPRESSION: No acute intracranial pathology. Signs of atrophy and chronic microvascular ischemic change as before. Signs of renal osteodystrophy. Electronically Signed   By: Zetta Bills M.D.   On: 01/23/2021 10:45   MR BRAIN WO CONTRAST  Result Date: 01/24/2021 CLINICAL DATA:  Mental status change. Dialysis patient. Possible seizure. EXAM: MRI HEAD WITHOUT CONTRAST TECHNIQUE: Multiplanar, multiecho pulse sequences of the brain and surrounding structures were obtained without intravenous contrast. COMPARISON:  CT head 01/24/2021, MRI 11/18/2020 FINDINGS: Brain: Motion degraded study. Susceptibility artifact right occipital region. No metal is seen in this area on CT. Cortical and subcortical FLAIR hyperintensity is present in the occipital parietal lobes bilaterally, not present on the recent MRI. Moderate to extensive chronic white matter changes throughout the periventricular  deep white matter bilaterally similar to the prior MRI. Brainstem intact. 12 mm hyperintensity right cerebellum not seen on the prior MRI. Negative for acute infarct.  Negative for hemorrhage or mass. Vascular: Normal arterial flow voids. Skull and upper cervical spine: Limited evaluation due to artifact. Sinuses/Orbits: Negative Other: None IMPRESSION: Negative for acute infarct Cortical and subcortical edema in the occipital parietal lobes bilaterally, right for slightly greater than left. This is not present on the prior MRI of 11/18/2020. Findings may be due to PRES. Correlate with hypertension history. Electronically Signed   By: Franchot Gallo M.D.   On: 01/24/2021 12:22   US Carotid Bilateral  Result Date: 01/24/2021 CLINICAL DATA:  Syncope, hypertension, altered mental status EXAM: BILATERAL CAROTID DUPLEX ULTRASOUND TECHNIQUE: Pearline Cables scale imaging, color Doppler and duplex ultrasound were performed of bilateral carotid  and vertebral arteries in the neck. COMPARISON:  None. FINDINGS: Criteria: Quantification of carotid stenosis is based on velocity parameters that correlate the residual internal carotid diameter with NASCET-based stenosis levels, using the diameter of the distal internal carotid lumen as the denominator for stenosis measurement. The following velocity measurements were obtained: RIGHT ICA: 56/17 cm/sec CCA: 443/15 cm/sec SYSTOLIC ICA/CCA RATIO:  0.4 ECA: 40 cm/sec LEFT ICA: 90/19 cm/sec CCA: 400/86 cm/sec SYSTOLIC ICA/CCA RATIO:  0.8 ECA: 51 cm/sec RIGHT CAROTID ARTERY: Tortuosity and mild to moderate atherosclerotic change. Despite this, no significant right ICA stenosis, velocity elevation, turbulent flow. Degree of narrowing less than 50% by ultrasound criteria. RIGHT VERTEBRAL ARTERY:  Normal antegrade flow LEFT CAROTID ARTERY: Similar atherosclerotic change. Negative for stenosis, velocity elevation, turbulent flow. Degree of narrowing also less than 50% by ultrasound criteria. LEFT VERTEBRAL ARTERY:  Normal antegrade flow IMPRESSION: Bilateral carotid atherosclerosis. Negative for stenosis. Degree of narrowing less than 50% bilaterally by ultrasound criteria. Patent antegrade vertebral flow bilaterally Electronically Signed   By: Jerilynn Mages.  Shick M.D.   On: 01/24/2021 11:58   DG CHEST PORT 1 VIEW  Result Date: 01/31/2021 CLINICAL DATA:  Dyspnea EXAM: PORTABLE CHEST 1 VIEW COMPARISON:  01/23/2021 FINDINGS: Cardiac shadow is stable. Aortic calcifications are again seen. Left jugular dialysis catheter is noted in satisfactory position. Feeding catheter extends into the stomach. Changes of prior vascular stenting are noted on the right stable from the prior exam. Central vascular prominence is noted. Some mild interstitial changes are noted increased from the prior exam. No focal confluent infiltrate or effusion is seen. No bony abnormality is noted. IMPRESSION: Changes of mild CHF. No other focal abnormality is  seen. Electronically Signed   By: Inez Catalina M.D.   On: 01/31/2021 19:02   DG Chest Portable 1 View  Result Date: 01/23/2021 CLINICAL DATA:  Syncope. EXAM: PORTABLE CHEST 1 VIEW COMPARISON:  Chest x-ray dated December 24, 2020. FINDINGS: Unchanged tunneled left internal jugular dialysis catheter. Stable cardiomegaly. Unchanged right brachiocephalic vein stent. Unchanged central pulmonary artery enlargement. No focal consolidation, pleural effusion, or pneumothorax. No acute osseous abnormality. IMPRESSION: 1. No active disease. Electronically Signed   By: Titus Dubin M.D.   On: 01/23/2021 10:49   DG Abd Portable 1V  Result Date: 01/27/2021 CLINICAL DATA:  Post feeding tube placement. EXAM: PORTABLE ABDOMEN - 1 VIEW COMPARISON:  CT abdomen and pelvis 10/01/2019. FINDINGS: Feeding tube tip is at the level of the gastric antrum. No dilated bowel loops visualized. No acute fractures. IMPRESSION: Feeding tube tip at the level of the gastric antrum. Electronically Signed   By: Ronney Asters M.D.   On: 01/27/2021 15:40  DG Swallowing Func-Speech Pathology  Result Date: 02/10/2021 Table formatting from the original result was not included. Objective Swallowing Evaluation: Type of Study: MBS-Modified Barium Swallow Study  Patient Details Name: [name] MRN: [id number] Date of Birth: [birth year] Today's Date: 01/31/2021 Time: SLP Start Time (ACUTE ONLY): 1200 -SLP Stop Time (ACUTE ONLY): 1470 SLP Time Calculation (min) (ACUTE ONLY): 30 min Past Medical History: Past Medical History: Diagnosis Date  Anemia   Anxiety   Atrial flutter ([place]) 02/22/2015  C. difficile colitis 10/10/2011  Chronic diarrhea   Chronic pain   Depression   ESRD on hemodialysis [facility])   Dialysis [name]  GERD (gastroesophageal reflux disease)   HIV (human immunodeficiency virus infection) ([place])   Hypertension   Insomnia   Intracranial hemorrhage ([facility])   Muscle weakness (generalized)   Pulmonary HTN ([place])   Tachycardia   TIA  (transient ischemic attack)   [facility] do not have this dx  Traumatic hematoma of right upper arm 02/22/2015 Past Surgical History: Past Surgical History: Procedure Laterality Date  A/V FISTULAGRAM N/A 04/17/2016  Procedure: A/V Fistulagram - Right Upper;  Surgeon: Waynetta Sandy, MD;  Location: [place] [facility];  Service: Cardiovascular;  Laterality: N/A;  A/V FISTULAGRAM Right 08/28/2019  Procedure: A/V FISTULAGRAM;  Surgeon: Waynetta Sandy, MD;  Location: [place] [facility];  Service: Cardiovascular;  Laterality: Right;  ABDOMINAL HYSTERECTOMY    APPLICATION OF A-CELL OF BACK Right 92/10/5745  Procedure: APPLICATION OF MYRIAD MATRIX;  Surgeon: Cherre Robins, MD;  Location: [place];  Service: Vascular;  Laterality: Right;  APPLICATION OF WOUND VAC Right 34/0/3709  Procedure: APPLICATION OF WOUND VAC;  Surgeon: Waynetta Sandy, MD;  Location: [place];  Service: Vascular;  Laterality: Right;  APPLICATION OF WOUND VAC Right 64/05/8379  Procedure: APPLICATION OF WOUND VAC;  Surgeon: Cherre Robins, MD;  Location: [place];  Service: Vascular;  Laterality: Right;  BIOPSY  01/02/2020  Procedure: BIOPSY;  Surgeon: Eloise Harman, DO;  [facility];  Service: Endoscopy;;  BIOPSY  07/11/2020  Procedure: BIOPSY;  Surgeon: Irving Copas., MD;  Location: [place];  Service: Gastroenterology;;  BIOPSY THYROID    CARPAL TUNNEL RELEASE Right 11/16/2019  Procedure: CARPAL TUNNEL RELEASE;  Surgeon: Leanora Cover, MD;  Location: [place];  Service: Orthopedics;  Laterality: Right;  COLONOSCOPY WITH PROPOFOL N/A 01/02/2020  Surgeon: Eloise Harman, DO;nonbleeding internal hemorrhoids, diverticulosis, status post biopsies of the entire colon (benign). Due for repeat in 2026.  Dialysis Shunts    previous one removed from left arm and now present one in right arm  DIALYSIS/PERMA CATHETER INSERTION Right 04/17/2016  Procedure: dialysis Catheter Insertion central  veinous;  Surgeon: Waynetta Sandy, MD;  Location: [place] [facility];  Service: Cardiovascular;  Laterality: Right;  ESOPHAGOGASTRODUODENOSCOPY  12/15/2010  [place]: erosive reflux esophagitis, MW tear, hiatal hernia (small), gastritis with no h.pylori, possibly nsaid related  ESOPHAGOGASTRODUODENOSCOPY (EGD) WITH PROPOFOL N/A 07/11/2020   Surgeon: Irving Copas., MD; normal esophagus, 4 cm hiatal hernia, gastritis biopsied (mild chronic gastritis, negative H. pylori), single duodenal polyp (Brunner's gland hyperplasia), s/p duodenal biopsy benign, duodenal narrowing at the duodenal sweep.  EUS N/A 07/11/2020   Surgeon: Irving Copas., MD;  dilated CBD and common hepatic duct without stones or sludge in the bile duct, stones and biliary sludge in the gallbladder, pancreatic parenchyma with hyperechoic strands, pancreatic duct dilated with prominent sidebranches in the neck and body and tail, no  pancreatic mass though visulalization limited, no ampullary lesion, no malignant appearing lymph node  EXCISION OF BREAST BIOPSY Right 05/04/2012  Procedure: EXCISION OF BREAST BIOPSY;  Surgeon: Donato Heinz, MD;  Location: [facility];  Service: General;  Laterality: Right;  Right Excisional Breast Biopsy  FISTULOGRAM Right 03/26/2014  Procedure: Right Arm Fistulogram with Venoplasty Right Subclavian Vein and Inominate Vein. Debridement Fistula Ulcer;  Surgeon: Conrad Milton, MD;  Location: [place];  Service: Vascular;  Laterality: Right;  FISTULOGRAM Right 03/29/2017  Procedure: FISTULOGRAM COMPLEX RIGHT ARM with Balloon angioplasty;  Surgeon: Waynetta Sandy, MD;  Location: [place];  Service: Vascular;  Laterality: Right;  HEMOSTASIS CLIP PLACEMENT  07/11/2020  Procedure: HEMOSTASIS CLIP PLACEMENT;  Surgeon: Irving Copas., MD;  Location: [place];  Service: Gastroenterology;;  INCISION AND DRAINAGE Right 12/27/2020  Procedure: INCISION AND DRAINAGE OF RIGHT FOREARM;  Surgeon:  Waynetta Sandy, MD;  Location: [place];  Service: Vascular;  Laterality: Right;  INCISION AND DRAINAGE Right 12/31/2020  Procedure: INCISION AND DRAINAGE RIGHT ARM WITH PLACEMNENT OF WOUND VAC;  Surgeon: Waynetta Sandy, MD;  Location: [place];  Service: Vascular;  Laterality: Right;  INCISION AND DRAINAGE Right 01/07/2021  Procedure: RIGHT ARM INCISION AND DRAINAGE;  Surgeon: Waynetta Sandy, MD;  Location: [place];  Service: Vascular;  Laterality: Right;  IR FLUORO GUIDE CV LINE LEFT  12/26/2020  IR FLUORO GUIDE CV LINE LEFT  12/30/2020  IR GENERIC HISTORICAL  05/19/2016  IR REMOVAL TUN CV CATH W/O FL 05/19/2016 Markus Daft, MD [facility]  IR US GUIDE VASC ACCESS LEFT  12/26/2020  IR US GUIDE VASC ACCESS LEFT  12/30/2020  LIGATION OF ARTERIOVENOUS  FISTULA Right 11/12/2020  Procedure: LIGATION OF ARTERIOVENOUS  FISTULA WITH EXCISION OF NECROTIC TISSUE;  Surgeon: Waynetta Sandy, MD;  Location: [place];  Service: Vascular;  Laterality: Right;  LIGATION OF ARTERIOVENOUS  FISTULA Right 12/27/2020  Procedure: LIGATION OF ARTERIOVENOUS  FISTULA;  Surgeon: Waynetta Sandy, MD;  Location: [place] [facility];  Service: Vascular;  Laterality: Right;  PERIPHERAL VASCULAR BALLOON ANGIOPLASTY Right 04/17/2016  Procedure: Peripheral Vascular Balloon Angioplasty;  Surgeon: Waynetta Sandy, MD;  Location: [facility];  Service: Cardiovascular;  Laterality: Right;  Central segment AV Fistula  PERIPHERAL VASCULAR BALLOON ANGIOPLASTY Right 03/14/2018  Procedure: PERIPHERAL VASCULAR BALLOON ANGIOPLASTY;  Surgeon: Waynetta Sandy, MD;  Location: [facility];  Service: Cardiovascular;  Laterality: Right;  subclavian vein  PERIPHERAL VASCULAR BALLOON ANGIOPLASTY Right 08/28/2019  Procedure: PERIPHERAL VASCULAR BALLOON ANGIOPLASTY;  Surgeon: Waynetta Sandy, MD;  Location: [facility];  Service: Cardiovascular;  Laterality: Right;  upper arm  PERIPHERAL  VASCULAR BALLOON ANGIOPLASTY Right 11/11/2020  Procedure: PERIPHERAL VASCULAR BALLOON ANGIOPLASTY;  Surgeon: Waynetta Sandy, MD;  Location: [facility];  Service: Cardiovascular;  Laterality: Right;  PERIPHERAL VASCULAR INTERVENTION Right 03/14/2018  Procedure: PERIPHERAL VASCULAR INTERVENTION;  Surgeon: Waynetta Sandy, MD;  Location: [place] [facility];  Service: Cardiovascular;  Laterality: Right;  subclavian vein  POLYPECTOMY  07/11/2020  Procedure: POLYPECTOMY;  Surgeon: Mansouraty, Telford Nab., MD;  Location: [facility] ENDOSCOPY;  Service: Gastroenterology;;  REVISON OF ARTERIOVENOUS FISTULA Right 11/12/2020  Procedure: CREATION OF RIGHT ARM ARTERIOVENOUS FISTULA;  Surgeon: Waynetta Sandy, MD;  Location: [place];  Service: Vascular;  Laterality: Right;  REVISON OF ARTERIOVENOUS FISTULA Right 02/03/2021  Procedure: EXPLORATION OF RIGHT ARTERIOVENOUS FISTULA  AND RIGHT BRACHIAL TO BRACHIAL BYPASS;  Surgeon: Cherre Robins, MD;  Location: [facility];  Service: Vascular;  Laterality: Right;  SHUNTOGRAM Right 10/19/2013  Procedure: FISTULOGRAM;  Surgeon: Conrad Deer Park, MD;  Location: [facility] CATH LAB;  Service: Cardiovascular;  Laterality: Right;  SKIN SPLIT GRAFT Right 01/07/2021  Procedure: MYRIAD SKIN GRAFT PLACEMENT;  Surgeon: Waynetta Sandy, MD;  Location: [place];  Service: Vascular;  Laterality: Right;  TONSILLECTOMY    VEIN HARVEST Right 02/03/2021  Procedure: VEIN HARVEST OF UPPER RIGHT LEG;  Surgeon: Cherre Robins, MD;  Location: [place];  Service: Vascular;  Laterality: Right;  VISCERAL VENOGRAPHY Right 03/14/2018  Procedure: CENTRAL VENOGRAPHY;  Surgeon: Waynetta Sandy, MD;  Location: [place] [facility];  Service: Cardiovascular;  Laterality: Right;  upper ext fistula HPI: [name] is a 70 y.o. female with medical history significant for ESRD on HD TTS, GERD, TIA, HIV, atrial flutter on Eliquis, anemia of chronic disease, chronic pain. Admitted from  hemodialysis with syncopal episode, altered mentation, elevated blood pressure readings and seizure activity overnight.  MRI no acute CVA and findings concerning for PRES; Cortical and subcortical edema in the occipital parietal lobes  bilaterally, right for slightly greater than left.  BSE 11/20/20 prolonged mastication, recommended Dys 2, thin and upgraded to regular several days later.  Subjective: Pt awake but very lethargic, requiring intermittent cues to remain awake.  Recommendations for follow up therapy are one component of a multi-disciplinary discharge planning process, led by the attending physician.  Recommendations may be updated based on patient status, additional functional criteria and insurance authorization. Assessment / Plan / Recommendation Clinical Impressions 01/31/2021 Clinical Impression Pt demonstrates mild persistent oropharyngeal dysphagia with impaired ability to masticate (though this is likely baseline given that it is fully related to pts lack of dentition) as well as mild to moderate oropharyngeal residue in valleculae and pyriform sinuses. There is decreased anterior hyoid excusion and epiglottic deflection with entrapment of liquids in pharyngeal sulci. No aspiration occured. There is additionally appeance of a cervical esophageal outpouching (Possibly a Zenkers though it was poorly visualized and appeared distal to typical location). Question if pt may be very close to her swallowing baseline. Presence of NG tube may be slightly impeding function. Pt able to initaite thin liquids and foods of choice. She does have difficulty masticating but again this is her baseline and pt can resume unrestricted textures. SLP Visit Diagnosis Dysphagia, oropharyngeal phase (R13.12) Attention and concentration deficit following -- Frontal lobe and executive function deficit following -- Impact on safety and function Mild aspiration risk   Treatment Recommendations 01/31/2021 Treatment Recommendations  Therapy as outlined in treatment plan below   Prognosis 01/31/2021 Prognosis for Safe Diet Advancement Good Barriers to Reach Goals -- Barriers/Prognosis Comment -- Diet Recommendations 01/31/2021 SLP Diet Recommendations Regular solids;Thin liquid Liquid Administration via Cup;Straw Medication Administration Crushed with puree Compensations -- Postural Changes --   Other Recommendations 01/31/2021 Recommended Consults -- Oral Care Recommendations Oral care QID Other Recommendations -- Follow Up Recommendations No SLP follow up Assistance recommended at discharge Intermittent Supervision/Assistance Functional Status Assessment Patient has had a recent decline in their functional status and demonstrates the ability to make significant improvements in function in a reasonable and predictable amount of time. Frequency and Duration  01/31/2021 Speech Therapy Frequency (ACUTE ONLY) min 2x/week Treatment Duration 2 weeks   Oral Phase 01/31/2021 Oral Phase Impaired Oral - Pudding Teaspoon -- Oral - Pudding Cup -- Oral - Honey Teaspoon -- Oral - Honey Cup -- Oral - Nectar Teaspoon -- Oral -  Nectar Cup -- Oral - Nectar Straw -- Oral - Thin Teaspoon -- Oral - Thin Cup Lingual/palatal residue Oral - Thin Straw Lingual/palatal residue Oral - Puree Lingual/palatal residue Oral - Mech Soft Lingual/palatal residue;Impaired mastication Oral - Regular -- Oral - Multi-Consistency -- Oral - Pill -- Oral Phase - Comment --  Pharyngeal Phase 01/31/2021 Pharyngeal Phase -- Pharyngeal- Pudding Teaspoon -- Pharyngeal -- Pharyngeal- Pudding Cup -- Pharyngeal -- Pharyngeal- Honey Teaspoon -- Pharyngeal -- Pharyngeal- Honey Cup -- Pharyngeal -- Pharyngeal- Nectar Teaspoon -- Pharyngeal -- Pharyngeal- Nectar Cup -- Pharyngeal -- Pharyngeal- Nectar Straw -- Pharyngeal -- Pharyngeal- Thin Teaspoon -- Pharyngeal -- Pharyngeal- Thin Cup -- Pharyngeal -- Pharyngeal- Thin Straw Pharyngeal residue - pyriform;Reduced epiglottic inversion;Reduced tongue base  retraction;Reduced anterior laryngeal mobility Pharyngeal Material does not enter airway Pharyngeal- Puree Pharyngeal residue - pyriform;Pharyngeal residue - valleculae;Reduced epiglottic inversion;Reduced pharyngeal peristalsis;Reduced anterior laryngeal mobility Pharyngeal -- Pharyngeal- Mechanical Soft -- Pharyngeal -- Pharyngeal- Regular NT Pharyngeal -- Pharyngeal- Multi-consistency -- Pharyngeal -- Pharyngeal- Pill -- Pharyngeal -- Pharyngeal Comment --  No flowsheet data found. DeBlois, Katherene Ponto 02/10/2021, 7:16 AM                     EEG adult  Result Date: 01/24/2021 Lora Havens, MD     01/24/2021  2:51 PM Patient Name: [name] MRN: [id number] Epilepsy Attending: Lora Havens Referring Physician/Provider: Dr Heath Lark Date: 01/24/2021 Duration: 23.35 mins Patient history: 70 year old female who presented with altered mental status, fever and seizure.  EEG to evaluate for seizure. Level of alertness:  lethargic AEDs during EEG study: LEV Technical aspects: This EEG study was done with scalp electrodes positioned according to the 10-20 International system of electrode placement. Electrical activity was acquired at a sampling rate of '500Hz'  and reviewed with a high frequency filter of '70Hz'  and a low frequency filter of '1Hz' . EEG data were recorded continuously and digitally stored. Description: No posterior dominant rhythm was seen.  EEG showed continuous generalized polymorphic sharply contoured 3 to 6 Hz theta-delta slowing. Generalized periodic discharges with triphasic morphology were noted with fluctuation frequency between 0.5- 1.5 Hz were also noted. Hyperventilation and photic stimulation were not performed.   ABNORMALITY - Periodic discharges with triphasic morphology, generalized ( GPDs) - Continuous slow, generalized IMPRESSION: This study showed generalized periodic discharges with triphasic morphology which is on the ictal-interictal continuum and low potential for  seizure recurrence. This eeg pattern can also be seen due to toxic-metabolic causes like uremia, hyperammonemia. Additionally eeg is suggestive of moderate diffuse encephalopathy, nonspecific etiology. No seizures or epileptiform discharges were seen throughout the recording. Priyanka Barbra Sarks   Overnight EEG with video  Result Date: 01/26/2021 Lora Havens, MD     01/27/2021  8:28 AM Patient Name: [name] MRN: [id number] Epilepsy Attending: Lora Havens Referring Physician/Provider: Dr Donnetta Simpers Duration: 01/25/2021 1021 to 01/26/2021 0200, 01/26/2021 0821- 1700  Patient history: 70 year old female who presented with altered mental status, fever and seizure.  EEG to evaluate for seizure.  Level of alertness:  lethargic  AEDs during EEG study: LEV, LCM  Technical aspects: This EEG study was done with scalp electrodes positioned according to the 10-20 International system of electrode placement. Electrical activity was acquired at a sampling rate of '500Hz'  and reviewed with a high frequency filter of '70Hz'  and a low frequency filter of '1Hz' . EEG data were recorded continuously and digitally stored.  Description: No posterior dominant rhythm was seen.  EEG showed continuous generalized polymorphic sharply contoured 3 to 6 Hz theta-delta slowing. Generalized periodic discharges with triphasic morphology were noted with fluctuation frequency between 1.5-2 Hz were also noted. At around 1400, frequency of generalized periodic discharges worsened to 2.'5hz'  and appeared more rhythmic which lasted for about an hour and improved after IV vimpat was administered. 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  Hyperventilation and photic stimulation were not performed. Of note, study was disconnected between 0200 to 0821 on 01/26/2021.   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 Additionally EEG showed generalized periodic discharges with triphasic morphology which is on the ictal-interictal continuum and low to intermediate potential for seizure recurrence. This eeg pattern can also be seen due to toxic-metabolic causes like uremia, hyperammonemia. EEG is also suggestive of moderate diffuse encephalopathy, nonspecific etiology.  Priyanka Barbra Sarks    DISCHARGE EXAMINATION:  See progress note from earlier today  DISPOSITION: SNF  Discharge Instructions     Call MD for:  difficulty breathing, headache or visual disturbances   Complete by: As directed    Call MD for:  extreme fatigue   Complete by: As directed    Call MD for:  persistant dizziness or light-headedness   Complete by: As directed    Call MD for:  persistant nausea and vomiting   Complete by: As directed    Call MD for:  redness, tenderness, or signs of infection (pain, swelling, redness, odor or green/yellow discharge around incision site)   Complete by: As directed    Call MD for:  severe uncontrolled pain   Complete by: As directed    Call MD for:  temperature >100.4   Complete by: As directed    Diet - low sodium heart healthy   Complete by: As directed    Discharge instructions   Complete by: As directed    Please review instructions on the discharge summary.  Vascular surgery to provide instructions regarding the arm and leg wounds.  You were cared for by a hospitalist during your hospital stay. If you have any questions about your discharge medications or the care you received while you were in the hospital after you are discharged, you can call the unit and asked to speak with the hospitalist on call if the hospitalist that took care of you is not available. Once you are discharged,  your primary care physician will handle any further medical issues. Please note that NO REFILLS for any discharge medications will be authorized once you are discharged, as it is imperative that you return to your primary care physician (or establish a relationship with a primary care physician if you do not have one) for your aftercare needs so that they can reassess your need for medications and monitor your lab values. If you do not have a primary care physician, you can call [phone] for a physician referral.   Discharge wound care:   Complete by: As directed    Vascular surgery to provide instructions regarding wound VAC to the right upper extremity and dressing changes to the right lower extremity.   Increase activity slowly   Complete by: As directed           Allergies as of 02/10/2021  Reactions   Lactose Intolerance (gi) Other (See Comments)   On MAR   Latex Rash, Itching   Penicillins Rash, Swelling   Has patient had a PCN reaction causing immediate rash, facial/tongue/throat swelling, SOB or lightheadedness with hypotension: No Has patient had a PCN reaction causing severe rash involving mucus membranes or skin necrosis: No Has patient had a PCN reaction that required hospitalization No Has patient had a PCN reaction occurring within the last 10 years: No If all of the above answers are "NO", then may proceed with Cephalosporin use.        Medication List     STOP taking these medications    clopidogrel 75 MG tablet Commonly known as: PLAVIX   fentaNYL 25 MCG/HR Commonly known as: DURAGESIC   melatonin 3 MG Tabs tablet   methadone 5 MG tablet Commonly known as: DOLOPHINE   Oxycodone HCl 10 MG Tabs   promethazine 12.5 MG tablet Commonly known as: PHENERGAN   venlafaxine XR 150 MG 24 hr capsule Commonly known as: EFFEXOR-XR Replaced by: venlafaxine 75 MG tablet   ZINC-220 PO       TAKE these medications    amLODipine 10 MG tablet Commonly  known as: NORVASC Take 1 tablet (10 mg total) by mouth daily.   apixaban 2.5 MG Tabs tablet Commonly known as: ELIQUIS Take 1 tablet (2.5 mg total) by mouth 2 (two) times daily.   baclofen 10 MG tablet Commonly known as: LIORESAL Take 10 mg by mouth daily as needed for muscle spasms.   calcitRIOL 0.25 MCG capsule Commonly known as: ROCALTROL Take 5 capsules (1.25 mcg total) by mouth Every Tuesday,Thursday,and Saturday with dialysis.   calcium acetate 667 MG capsule Commonly known as: PHOSLO Take 2 capsules (1,334 mg total) by mouth 3 (three) times daily with meals.   cinacalcet 30 MG tablet Commonly known as: SENSIPAR Take 3 tablets (90 mg total) by mouth Every Tuesday,Thursday,and Saturday with dialysis.   collagenase ointment Commonly known as: SANTYL Apply 1 application topically daily. Apply to sacrum topically every day shift for wound healing   cyanocobalamin 1000 MCG/ML injection Commonly known as: (VITAMIN B-12) Inject 1,000 mcg into the muscle every 30 (thirty) days. On the 28th of every month   doxycycline 100 MG tablet Commonly known as: VIBRA-TABS Take 1 tablet (100 mg total) by mouth every 12 (twelve) hours. Start taking on: February 13, 2021   Lacosamide 150 MG Tabs Take 1 tablet (150 mg total) by mouth 2 (two) times daily.   lamivudine 100 MG tablet Commonly known as: EPIVIR Take 50 mg by mouth daily. (0800)   levETIRAcetam 1000 MG tablet Commonly known as: KEPPRA Take 1 tablet (1,000 mg total) by mouth daily.   levETIRAcetam 250 MG tablet Commonly known as: KEPPRA Take 1 tablet (250 mg total) by mouth every Tuesday, Thursday, and Saturday at 6 PM.   Lidocaine HCl 4 % Crea Apply 1 application topically every 12 (twelve) hours as needed (pain).   lidocaine-prilocaine cream Commonly known as: EMLA Apply 1 application topically See admin instructions. (0900) Apply to fistula site one hour before dialysis on Tuesday, Thursday, and Saturday.    megestrol 400 MG/10ML suspension Commonly known as: MEGACE Take 400 mg by mouth daily.   metoprolol tartrate 25 MG tablet Commonly known as: LOPRESSOR Take 1 tablet (25 mg total) by mouth 2 (two) times daily.   Minerin Lotn Apply 1 application topically every 12 (twelve) hours as needed (dry skin). Apply to feet and ankles  mirtazapine 15 MG tablet Commonly known as: REMERON Take 15 mg by mouth at bedtime.   multivitamin with minerals Tabs tablet Take 1 tablet by mouth at bedtime. (2000)   nitroGLYCERIN 0.4 MG SL tablet Commonly known as: NITROSTAT Place 0.4 mg under the tongue every 5 (five) minutes x 3 doses as needed for chest pain.   nystatin 100000 UNIT/ML suspension Commonly known as: MYCOSTATIN Take 5 mLs (500,000 Units total) by mouth 4 (four) times daily for 7 days.   ondansetron 8 MG disintegrating tablet Commonly known as: Zofran ODT Take 1 tablet (8 mg total) by mouth every 8 (eight) hours as needed for nausea or vomiting.   oxyCODONE-acetaminophen 5-325 MG tablet Commonly known as: PERCOCET/ROXICET Take 1-2 tablets by mouth every 6 (six) hours as needed for moderate pain.   pantoprazole 40 MG tablet Commonly known as: Protonix Take 1 tablet (40 mg total) by mouth daily before breakfast.   polyethylene glycol 17 g packet Commonly known as: MIRALAX / GLYCOLAX Take 17 g by mouth daily as needed for moderate constipation.   PRO-STAT 64 PO Take by mouth. Give 70m by mouth in the morning for wound healing   senna 8.6 MG tablet Commonly known as: SENOKOT Take 2 tablets by mouth every evening. (2100)   thiamine 100 MG tablet Take 1 tablet (100 mg total) by mouth daily.   Tivicay 50 MG tablet Generic drug: dolutegravir Take 50 mg by mouth daily at 4 PM.   vancomycin 500-5 MG/100ML-% IVPB Commonly known as: VANCOCIN Inject 100 mLs (500 mg total) into the vein Every Tuesday,Thursday,and Saturday with dialysis.   venlafaxine 75 MG tablet Commonly  known as: EFFEXOR Take 1 tablet (75 mg total) by mouth 2 (two) times daily. Replaces: venlafaxine XR 150 MG 24 hr capsule   vitamin C 500 MG tablet Commonly known as: ASCORBIC ACID Take 500 mg by mouth 2 (two) times daily.   zidovudine 100 MG capsule Commonly known as: RETROVIR Take 100 mg by mouth 3 (three) times daily. (0900, 1300, & 2100)               Discharge Care Instructions  (From admission, onward)           Start     Ordered   02/10/21 0000  Discharge wound care:       Comments: Vascular surgery to provide instructions regarding wound VAC to the right upper extremity and dressing changes to the right lower extremity.   02/10/21 1203              Contact information for follow-up providers     AMal Amabile FAnthony Sar MD. Schedule an appointment as soon as possible for a visit .   Specialty: Internal Medicine Why: As needed if you continue to have similar symptoms. Contact information: [place] [phone]             Contact information for after-discharge care     Destination     [facility] .   Service: Skilled Nursing Contact information: [phone]                    TOTAL DISCHARGE TIME: 329minutes  [facility] [place] Pager on [email or web address]  02/10/2021, 12:12 PM

## 2021-02-10 NOTE — Progress Notes (Signed)
Patient alert and orin. No complaints voiced. Patient being D.C. to SNF via [place]  [facility]. With belongings.

## 2021-02-10 NOTE — Care Management Important Message (Signed)
Important Message  Patient Details  Name: [name] MRN: [id number] Date of Birth: [birth year]   Medicare Important Message Given:  Yes     [name] 02/10/2021, 10:13 AM

## 2021-02-10 NOTE — Progress Notes (Signed)
[facility] ROUNDING NOTE   Subjective:   Interval History: ESRD [name] TTS dialysis  acute encephalopathy with findings consistent with PRES. Mental status appears stable. Last dialysis 12/11 with 3 L  removed  next diaysis will be 12/13  BP 132 /85  P 82  T 99  Sats 98 %   Na 131 K 5  Cl 96 Co2 24  BUN 24  Cr 5.4  Hb8.8   Objective:  Vital signs in last 24 hours:  Temp:  [97.9 F (36.6 C)-99.4 F (37.4 C)] 99.2 F (37.3 C) (12/12 0752) Pulse Rate:  [77-98] 82 (12/12 0752) Resp:  [14-20] 20 (12/12 0752) BP: (125-144)/(76-98) 132/85 (12/12 0752) SpO2:  [95 %-100 %] 97 % (12/12 0752) Weight:  [37.9 kg] 37.9 kg (12/12 0456)  Weight change: -2.3 kg Filed Weights   02/08/21 1107 02/09/21 0407 02/10/21 0456  Weight: 37.3 kg 37.1 kg 37.9 kg    Intake/Output: I/O last 3 completed shifts: In: 100 [P.O.:100] Out: 0    Intake/Output this shift:  Total I/O In: 150 [P.O.:150] Out: -   CVS- RRR RS- CTA ABD- BS present soft non-distended EXT- no edema   Basic Metabolic Panel: Recent Labs  Lab 02/04/21 0332 02/06/21 0125 02/08/21 0151  NA 132* 132* 131*  K 5.9* 5.2* 5.0  CL 100 97* 96*  CO2 22 24 24   GLUCOSE 79 58* 96  BUN 28* 29* 25*  CREATININE 5.93* 6.26* 5.40*  CALCIUM 8.5* 7.7* 7.5*  PHOS 4.3 3.4  --     Liver Function Tests: Recent Labs  Lab 02/04/21 0332 02/06/21 0125  ALBUMIN 2.4* 2.5*   No results for input(s): LIPASE, AMYLASE in the last 168 hours. No results for input(s): AMMONIA in the last 168 hours.  CBC: Recent Labs  Lab 02/06/21 0125 02/07/21 0126 02/08/21 0151 02/09/21 0224 02/10/21 0709  WBC 8.6 7.8 9.4 8.2 9.8  HGB 8.3* 7.9* 8.8* 9.5* 9.6*  HCT 26.3* 25.3* 28.0* 29.9* 30.2*  MCV 93.9 94.4 95.9 94.3 94.4  PLT 135* 137* 173 189 205    Cardiac Enzymes: No results for input(s): CKTOTAL, CKMB, CKMBINDEX, TROPONINI in the last 168 hours.  BNP: Invalid input(s): POCBNP  CBG: Recent Labs  Lab  02/09/21 1737 02/09/21 2044 02/09/21 2349 02/10/21 0408 02/10/21 0822  GLUCAP 183* 97 127* 85 68*    Microbiology: Results for orders placed or performed during the hospital encounter of 01/23/21  Resp Panel by RT-PCR (Flu A&B, Covid) Nasopharyngeal Swab     Status: None   Collection Time: 01/23/21  3:04 PM   Specimen: Nasopharyngeal Swab; Nasopharyngeal(NP) swabs in vial transport medium  Result Value Ref Range Status   SARS Coronavirus 2 by RT PCR NEGATIVE NEGATIVE Final    Comment: (NOTE) SARS-CoV-2 target nucleic acids are NOT DETECTED.  The SARS-CoV-2 RNA is generally detectable in upper respiratory specimens during the acute phase of infection. The lowest concentration of SARS-CoV-2 viral copies this assay can detect is 138 copies/mL. A negative result does not preclude SARS-Cov-2 infection and should not be used as the sole basis for treatment or other patient management decisions. A negative result may occur with  improper specimen collection/handling, submission of specimen other than nasopharyngeal swab, presence of viral mutation(s) within the areas targeted by this assay, and inadequate number of viral copies(<138 copies/mL). A negative result must be combined with clinical observations, patient history, and epidemiological information. The expected result is Negative.  Fact Sheet for Patients:  EntrepreneurPulse.com.au  Fact Sheet for Healthcare Providers:  IncredibleEmployment.be  This test is no t yet approved or cleared by the [place] FDA and  has been authorized for detection and/or diagnosis of SARS-CoV-2 by FDA under an Emergency Use Authorization (EUA). This EUA will remain  in effect (meaning this test can be used) for the duration of the COVID-19 declaration under Section 564(b)(1) of the Act, 21 U.S.C.section 360bbb-3(b)(1), unless the authorization is terminated  or revoked sooner.       Influenza A by  PCR NEGATIVE NEGATIVE Final   Influenza B by PCR NEGATIVE NEGATIVE Final    Comment: (NOTE) The Xpert Xpress SARS-CoV-2/FLU/RSV plus assay is intended as an aid in the diagnosis of influenza from Nasopharyngeal swab specimens and should not be used as a sole basis for treatment. Nasal washings and aspirates are unacceptable for Xpert Xpress SARS-CoV-2/FLU/RSV testing.  Fact Sheet for Patients: EntrepreneurPulse.com.au  Fact Sheet for Healthcare Providers: IncredibleEmployment.be  This test is not yet approved or cleared by the [place] FDA and has been authorized for detection and/or diagnosis of SARS-CoV-2 by FDA under an Emergency Use Authorization (EUA). This EUA will remain in effect (meaning this test can be used) for the duration of the COVID-19 declaration under Section 564(b)(1) of the Act, 21 U.S.C. section 360bbb-3(b)(1), unless the authorization is terminated or revoked.  Performed at [facility], [address] [place], [place] [address]   Culture, blood (routine x 2)     Status: None   Collection Time: 01/24/21  1:31 PM   Specimen: BLOOD RIGHT HAND  Result Value Ref Range Status   Specimen Description BLOOD RIGHT HAND  Final   Special Requests   Final    Immunocompromised BOTTLES DRAWN AEROBIC AND ANAEROBIC Blood Culture results may not be optimal due to an inadequate volume of blood received in culture bottles   Culture   Final    NO GROWTH 5 DAYS Performed at [facility], [address], [place], [place] [address]    Report Status 01/29/2021 FINAL  Final  Culture, blood (routine x 2)     Status: None   Collection Time: 01/24/21  1:31 PM   Specimen: BLOOD LEFT HAND  Result Value Ref Range Status   Specimen Description BLOOD LEFT HAND  Final   Special Requests   Final    Immunocompromised BOTTLES DRAWN AEROBIC AND ANAEROBIC Blood Culture adequate volume   Culture   Final    NO GROWTH 5 DAYS Performed at [facility], [address], [place], [place] [address]    Report Status 01/29/2021 FINAL  Final  MRSA Next Gen by PCR, Nasal     Status: None   Collection Time: 01/25/21  4:14 AM  Result Value Ref Range Status   MRSA by PCR Next Gen NOT DETECTED NOT DETECTED Final    Comment: (NOTE) The GeneXpert MRSA Assay (FDA approved for NASAL specimens only), is one component of a comprehensive MRSA colonization surveillance program. It is not intended to diagnose MRSA infection nor to guide or monitor treatment for MRSA infections. Test performance is not FDA approved in patients less than 62 years old. Performed at [facility], [place] [address], [place],  [address]   CSF culture w Gram Stain     Status: None   Collection Time: 01/25/21  2:43 PM   Specimen: CSF; Cerebrospinal Fluid  Result Value Ref Range Status   Specimen Description CSF  Final   Special Requests Immunocompromised  Final  Gram Stain   Final    WBC PRESENT, PREDOMINANTLY PMN NO ORGANISMS SEEN CYTOSPIN SMEAR    Culture   Final    NO GROWTH Performed at [facility], [place] [address], [place], [place] [address]    Report Status 01/29/2021 FINAL  Final  Resp Panel by RT-PCR (Flu A&B, Covid) Nasopharyngeal Swab     Status: None   Collection Time: 02/03/21  5:59 PM   Specimen: Nasopharyngeal Swab; Nasopharyngeal(NP) swabs in vial transport medium  Result Value Ref Range Status   SARS Coronavirus 2 by RT PCR NEGATIVE NEGATIVE Final    Comment: (NOTE) SARS-CoV-2 target nucleic acids are NOT DETECTED.  The SARS-CoV-2 RNA is generally detectable in upper respiratory specimens during the acute phase of infection. The lowest concentration of SARS-CoV-2 viral copies this assay can detect is 138 copies/mL. A negative result does not preclude SARS-Cov-2 infection and should not be used as the sole basis for treatment or other patient management decisions. A negative result may occur with  improper specimen  collection/handling, submission of specimen other than nasopharyngeal swab, presence of viral mutation(s) within the areas targeted by this assay, and inadequate number of viral copies(<138 copies/mL). A negative result must be combined with clinical observations, patient history, and epidemiological information. The expected result is Negative.  Fact Sheet for Patients:  EntrepreneurPulse.com.au  Fact Sheet for Healthcare Providers:  IncredibleEmployment.be  This test is no t yet approved or cleared by the [place] FDA and  has been authorized for detection and/or diagnosis of SARS-CoV-2 by FDA under an Emergency Use Authorization (EUA). This EUA will remain  in effect (meaning this test can be used) for the duration of the COVID-19 declaration under Section 564(b)(1) of the Act, 21 U.S.C.section 360bbb-3(b)(1), unless the authorization is terminated  or revoked sooner.       Influenza A by PCR NEGATIVE NEGATIVE Final   Influenza B by PCR NEGATIVE NEGATIVE Final    Comment: (NOTE) The Xpert Xpress SARS-CoV-2/FLU/RSV plus assay is intended as an aid in the diagnosis of influenza from Nasopharyngeal swab specimens and should not be used as a sole basis for treatment. Nasal washings and aspirates are unacceptable for Xpert Xpress SARS-CoV-2/FLU/RSV testing.  Fact Sheet for Patients: EntrepreneurPulse.com.au  Fact Sheet for Healthcare Providers: IncredibleEmployment.be  This test is not yet approved or cleared by the [place] FDA and has been authorized for detection and/or diagnosis of SARS-CoV-2 by FDA under an Emergency Use Authorization (EUA). This EUA will remain in effect (meaning this test can be used) for the duration of the COVID-19 declaration under Section 564(b)(1) of the Act, 21 U.S.C. section 360bbb-3(b)(1), unless the authorization is terminated or revoked.  Performed at [facility], [place] [address] [place], [place] [address]   Culture, blood (Routine X 2) w Reflex to ID Panel     Status: None (Preliminary result)   Collection Time: 02/08/21 11:34 AM   Specimen: BLOOD  Result Value Ref Range Status   Specimen Description BLOOD BLOOD LEFT HAND  Final   Special Requests   Final    BOTTLES DRAWN AEROBIC AND ANAEROBIC Blood Culture results may not be optimal due to an inadequate volume of blood received in culture bottles   Culture   Final    NO GROWTH 2 DAYS Performed at [facility], [place] [address] [place],  [address]    Report Status PENDING  Incomplete  Culture, blood (Routine X 2) w Reflex to  ID Panel     Status: None (Preliminary result)   Collection Time: 02/08/21 11:59 AM   Specimen: BLOOD  Result Value Ref Range Status   Specimen Description BLOOD BLOOD LEFT HAND  Final   Special Requests AEROBIC BOTTLE ONLY Blood Culture adequate volume  Final   Culture   Final    NO GROWTH 2 DAYS Performed at [facility], [place] [address], [place], [place] [address]    Report Status PENDING  Incomplete    Coagulation Studies: No results for input(s): LABPROT, INR in the last 72 hours.  Urinalysis: No results for input(s): COLORURINE, LABSPEC, PHURINE, GLUCOSEU, HGBUR, BILIRUBINUR, KETONESUR, PROTEINUR, UROBILINOGEN, NITRITE, LEUKOCYTESUR in the last 72 hours.  Invalid input(s): APPERANCEUR    Imaging: No results found.   Medications:     amLODipine  10 mg Oral Daily   apixaban  2.5 mg Oral BID   calcitRIOL  1.25 mcg Oral Q T,Th,Sa-HD   calcium acetate  1,334 mg Oral TID WC   chlorhexidine  15 mL Mouth Rinse BID   Chlorhexidine Gluconate Cloth  6 each Topical Q0600   cinacalcet  60 mg Oral Q T,Th,Sa-HD   darbepoetin (ARANESP) injection - DIALYSIS  40 mcg Intravenous Q Tue-HD   dolutegravir  50 mg Oral q1600   lacosamide  150 mg Oral BID   lamiVUDine  50 mg Oral Daily   levETIRAcetam  1,000 mg Oral Daily   levETIRAcetam   250 mg Oral Q T,Th,Sat-1800   lidocaine-prilocaine  1 application Topical Q T,Th,Sa-HD   mouth rinse  15 mL Mouth Rinse q12n4p   metoprolol tartrate  25 mg Oral BID   mirtazapine  15 mg Oral QHS   nystatin  5 mL Oral QID   pantoprazole  40 mg Oral QHS   senna  2 tablet Oral QPM   thiamine  100 mg Oral Daily   vancomycin  500 mg Intravenous Q T,Th,Sa-HD   venlafaxine  75 mg Oral BID   zidovudine  100 mg Oral Q8H   acetaminophen **OR** acetaminophen, heparin sodium (porcine), hydrALAZINE, HYDROmorphone (DILAUDID) injection, lanolin/mineral oil, lidocaine, loperamide, LORazepam, ondansetron **OR** ondansetron (ZOFRAN) IV, ondansetron, oxyCODONE-acetaminophen, polyethylene glycol  Assessment/ Plan:  ESRD- TTS dialysis  [place] Left IJ catheter  ANEMIA- continues ESA  last give Tue MBD- continue to follow  low ca and reduced dose of sensipar. HTN/VOL- continue to ultrafilter with dialysis ACCESS- Right radiocephalic fistula infection: Status post ligation and debridement on 10/28 with inconsistent outpatient antibiotic therapy due to missing hemodialysis or signing off early.  Restarted back on antibiotic therapy with vancomycin. Awaiting SNF     LOS: [place] @TODAY @9 :55 AM

## 2021-02-11 ENCOUNTER — Inpatient Hospital Stay: Payer: Medicare Other | Admitting: Infectious Diseases

## 2021-02-12 ENCOUNTER — Encounter (HOSPITAL_COMMUNITY): Payer: Self-pay | Admitting: Vascular Surgery

## 2021-02-13 ENCOUNTER — Encounter (HOSPITAL_COMMUNITY): Payer: Self-pay | Admitting: Emergency Medicine

## 2021-02-13 ENCOUNTER — Inpatient Hospital Stay (HOSPITAL_COMMUNITY)
Admission: EM | Admit: 2021-02-13 | Discharge: 2021-02-21 | DRG: 314 | Disposition: A | Discharge status: Skilled Nursing Facility | Payer: Medicare Other | Source: Skilled Nursing Facility | Attending: Internal Medicine | Admitting: Internal Medicine

## 2021-02-13 ENCOUNTER — Emergency Department (HOSPITAL_COMMUNITY): Payer: Medicare Other

## 2021-02-13 ENCOUNTER — Other Ambulatory Visit: Payer: Self-pay

## 2021-02-13 DIAGNOSIS — M79601 Pain in right arm: Secondary | ICD-10-CM | POA: Diagnosis not present

## 2021-02-13 DIAGNOSIS — F419 Anxiety disorder, unspecified: Secondary | ICD-10-CM | POA: Diagnosis present

## 2021-02-13 DIAGNOSIS — T82838A Hemorrhage of vascular prosthetic devices, implants and grafts, initial encounter: Secondary | ICD-10-CM | POA: Diagnosis not present

## 2021-02-13 DIAGNOSIS — R112 Nausea with vomiting, unspecified: Secondary | ICD-10-CM

## 2021-02-13 DIAGNOSIS — I4892 Unspecified atrial flutter: Secondary | ICD-10-CM | POA: Diagnosis not present

## 2021-02-13 DIAGNOSIS — Z8614 Personal history of Methicillin resistant Staphylococcus aureus infection: Secondary | ICD-10-CM

## 2021-02-13 DIAGNOSIS — D500 Iron deficiency anemia secondary to blood loss (chronic): Secondary | ICD-10-CM

## 2021-02-13 DIAGNOSIS — K219 Gastro-esophageal reflux disease without esophagitis: Secondary | ICD-10-CM | POA: Diagnosis not present

## 2021-02-13 DIAGNOSIS — F32A Depression, unspecified: Secondary | ICD-10-CM | POA: Diagnosis present

## 2021-02-13 DIAGNOSIS — Z91040 Latex allergy status: Secondary | ICD-10-CM

## 2021-02-13 DIAGNOSIS — Z681 Body mass index (BMI) 19 or less, adult: Secondary | ICD-10-CM

## 2021-02-13 DIAGNOSIS — D689 Coagulation defect, unspecified: Secondary | ICD-10-CM | POA: Diagnosis not present

## 2021-02-13 DIAGNOSIS — Z992 Dependence on renal dialysis: Secondary | ICD-10-CM | POA: Diagnosis not present

## 2021-02-13 DIAGNOSIS — G4700 Insomnia, unspecified: Secondary | ICD-10-CM | POA: Diagnosis not present

## 2021-02-13 DIAGNOSIS — Y841 Kidney dialysis as the cause of abnormal reaction of the patient, or of later complication, without mention of misadventure at the time of the procedure: Secondary | ICD-10-CM | POA: Diagnosis present

## 2021-02-13 DIAGNOSIS — Z48812 Encounter for surgical aftercare following surgery on the circulatory system: Secondary | ICD-10-CM

## 2021-02-13 DIAGNOSIS — D638 Anemia in other chronic diseases classified elsewhere: Secondary | ICD-10-CM | POA: Diagnosis present

## 2021-02-13 DIAGNOSIS — Z7901 Long term (current) use of anticoagulants: Secondary | ICD-10-CM

## 2021-02-13 DIAGNOSIS — T148XXA Other injury of unspecified body region, initial encounter: Secondary | ICD-10-CM

## 2021-02-13 DIAGNOSIS — E43 Unspecified severe protein-calorie malnutrition: Secondary | ICD-10-CM | POA: Diagnosis present

## 2021-02-13 DIAGNOSIS — Z20822 Contact with and (suspected) exposure to covid-19: Secondary | ICD-10-CM | POA: Diagnosis not present

## 2021-02-13 DIAGNOSIS — B20 Human immunodeficiency virus [HIV] disease: Secondary | ICD-10-CM | POA: Diagnosis present

## 2021-02-13 DIAGNOSIS — I10 Essential (primary) hypertension: Secondary | ICD-10-CM

## 2021-02-13 DIAGNOSIS — Z66 Do not resuscitate: Secondary | ICD-10-CM | POA: Diagnosis present

## 2021-02-13 DIAGNOSIS — G8929 Other chronic pain: Secondary | ICD-10-CM | POA: Diagnosis not present

## 2021-02-13 DIAGNOSIS — I120 Hypertensive chronic kidney disease with stage 5 chronic kidney disease or end stage renal disease: Secondary | ICD-10-CM | POA: Diagnosis not present

## 2021-02-13 DIAGNOSIS — R197 Diarrhea, unspecified: Secondary | ICD-10-CM

## 2021-02-13 DIAGNOSIS — E739 Lactose intolerance, unspecified: Secondary | ICD-10-CM | POA: Diagnosis present

## 2021-02-13 DIAGNOSIS — N186 End stage renal disease: Secondary | ICD-10-CM | POA: Diagnosis present

## 2021-02-13 DIAGNOSIS — E876 Hypokalemia: Secondary | ICD-10-CM | POA: Diagnosis not present

## 2021-02-13 DIAGNOSIS — D62 Acute posthemorrhagic anemia: Secondary | ICD-10-CM | POA: Diagnosis not present

## 2021-02-13 DIAGNOSIS — I4891 Unspecified atrial fibrillation: Secondary | ICD-10-CM | POA: Diagnosis not present

## 2021-02-13 DIAGNOSIS — Z79899 Other long term (current) drug therapy: Secondary | ICD-10-CM

## 2021-02-13 DIAGNOSIS — I959 Hypotension, unspecified: Secondary | ICD-10-CM | POA: Diagnosis not present

## 2021-02-13 DIAGNOSIS — Z880 Allergy status to penicillin: Secondary | ICD-10-CM

## 2021-02-13 DIAGNOSIS — Z8673 Personal history of transient ischemic attack (TIA), and cerebral infarction without residual deficits: Secondary | ICD-10-CM

## 2021-02-13 LAB — CBC WITH DIFFERENTIAL/PLATELET
Abs Immature Granulocytes: 0.04 10*3/uL (ref 0.00–0.07)
Basophils Absolute: 0 10*3/uL (ref 0.0–0.1)
Basophils Relative: 0 %
Eosinophils Absolute: 0 10*3/uL (ref 0.0–0.5)
Eosinophils Relative: 1 %
HCT: 26.7 % — ABNORMAL LOW (ref 36.0–46.0)
Hemoglobin: 8 g/dL — ABNORMAL LOW (ref 12.0–15.0)
Immature Granulocytes: 1 %
Lymphocytes Relative: 10 %
Lymphs Abs: 0.8 10*3/uL (ref 0.7–4.0)
MCH: 30.3 pg (ref 26.0–34.0)
MCHC: 30 g/dL (ref 30.0–36.0)
MCV: 101.1 fL — ABNORMAL HIGH (ref 80.0–100.0)
Monocytes Absolute: 0.5 10*3/uL (ref 0.1–1.0)
Monocytes Relative: 6 %
Neutro Abs: 7.2 10*3/uL (ref 1.7–7.7)
Neutrophils Relative %: 82 %
Platelets: 268 10*3/uL (ref 150–400)
RBC: 2.64 MIL/uL — ABNORMAL LOW (ref 3.87–5.11)
RDW: 20.6 % — ABNORMAL HIGH (ref 11.5–15.5)
WBC: 8.7 10*3/uL (ref 4.0–10.5)
nRBC: 0 % (ref 0.0–0.2)

## 2021-02-13 LAB — RESP PANEL BY RT-PCR (FLU A&B, COVID) ARPGX2
Influenza A by PCR: NEGATIVE
Influenza B by PCR: NEGATIVE
SARS Coronavirus 2 by RT PCR: NEGATIVE

## 2021-02-13 LAB — CULTURE, BLOOD (ROUTINE X 2)
Culture: NO GROWTH
Culture: NO GROWTH
Special Requests: ADEQUATE

## 2021-02-13 LAB — BASIC METABOLIC PANEL
Anion gap: 13 (ref 5–15)
BUN: 32 mg/dL — ABNORMAL HIGH (ref 8–23)
CO2: 24 mmol/L (ref 22–32)
Calcium: 7.8 mg/dL — ABNORMAL LOW (ref 8.9–10.3)
Chloride: 102 mmol/L (ref 98–111)
Creatinine, Ser: 6.29 mg/dL — ABNORMAL HIGH (ref 0.44–1.00)
GFR, Estimated: 7 mL/min — ABNORMAL LOW (ref >60)
Glucose, Bld: 118 mg/dL — ABNORMAL HIGH (ref 70–99)
Potassium: 3.7 mmol/L (ref 3.5–5.1)
Sodium: 139 mmol/L (ref 135–145)

## 2021-02-13 LAB — HEMOGLOBIN AND HEMATOCRIT, BLOOD
HCT: 22.5 % — ABNORMAL LOW (ref 36.0–46.0)
Hemoglobin: 6.6 g/dL — CL (ref 12.0–15.0)

## 2021-02-13 LAB — PREPARE RBC (CROSSMATCH)

## 2021-02-13 IMAGING — DX DG CHEST 1V
1 series · 1 of 1 positions shown · non-contrast
Comparison: [DATE]

CLINICAL DATA: Difficulty breathing

EXAM:
CHEST  1 VIEW

[chest ap]
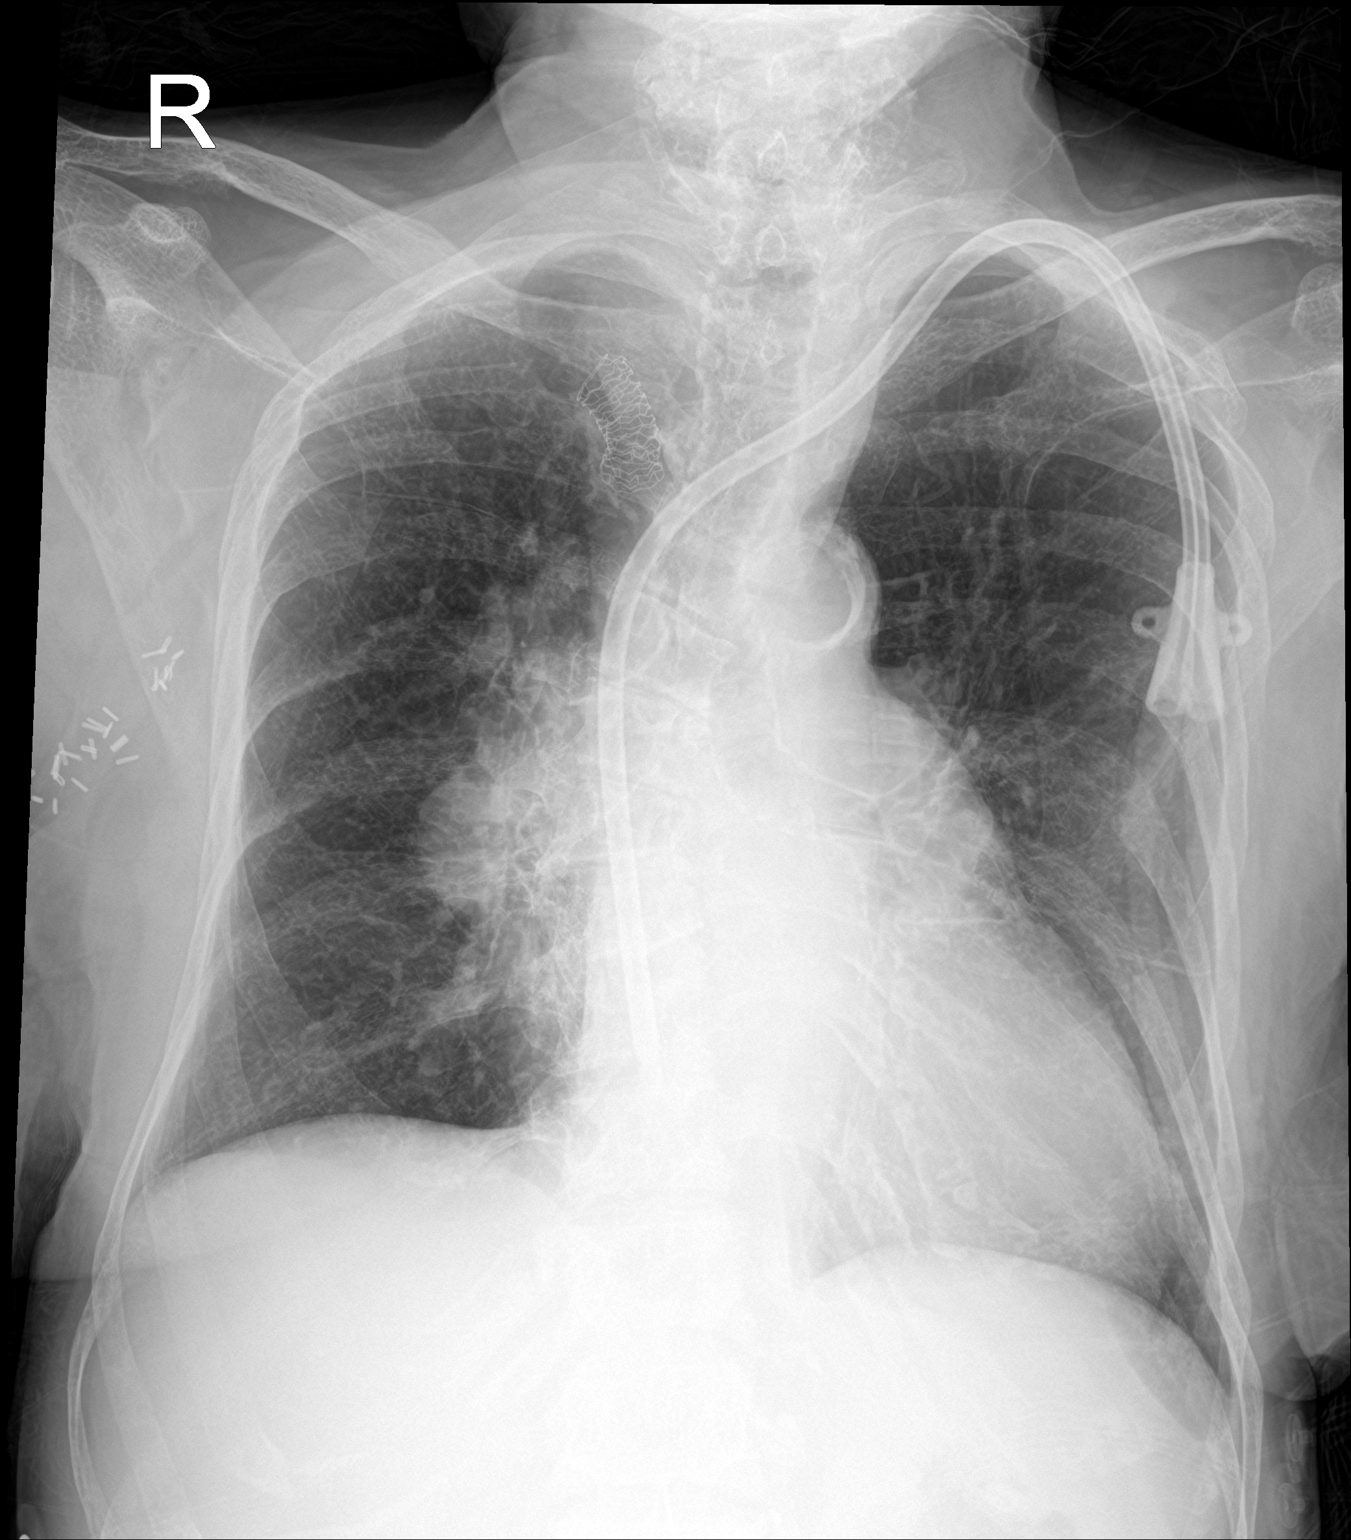

[1 of 1 positions shown; findings below may reference images not displayed]

FINDINGS: Transverse diameter of heart is increased. Central pulmonary vessels
are less prominent. Prominence of hilar regions most likely is due
to ectatic central pulmonary arteries. There are no signs of
alveolar pulmonary edema or new focal infiltrates. There is no
pleural effusion or pneumothorax. Tip of dialysis catheter is seen
in the region of right atrium. Surgical clips are seen in the right
axilla. There is a vascular stent in the course of right innominate.
IMPRESSION: Cardiomegaly. There are no signs of pulmonary edema or new focal
infiltrates.

## 2021-02-13 MED ORDER — SODIUM CHLORIDE 0.9% IV SOLUTION: Freq: Once | INTRAVENOUS | Status: AC

## 2021-02-13 MED ORDER — LEVETIRACETAM 500 MG PO TABS
1000.0000 mg | ORAL_TABLET | Freq: Every day | ORAL | Status: DC
  Administered 2021-02-13 – 2021-02-21 (×9): 1000 mg via ORAL
  Filled 2021-02-13 (×8): qty 2

## 2021-02-13 MED ORDER — ASCORBIC ACID 500 MG PO TABS
500.0000 mg | ORAL_TABLET | Freq: Two times a day (BID) | ORAL | Status: DC
  Administered 2021-02-13 – 2021-02-21 (×13): 500 mg via ORAL
  Filled 2021-02-13 (×14): qty 1

## 2021-02-13 MED ORDER — ACETAMINOPHEN 650 MG RE SUPP: 650.0000 mg | Freq: Four times a day (QID) | RECTAL | Status: DC | PRN

## 2021-02-13 MED ORDER — PANTOPRAZOLE SODIUM 40 MG PO TBEC
40.0000 mg | DELAYED_RELEASE_TABLET | Freq: Every day | ORAL | Status: DC
  Administered 2021-02-14 – 2021-02-21 (×6): 40 mg via ORAL
  Filled 2021-02-13 (×6): qty 1

## 2021-02-13 MED ORDER — LEVETIRACETAM 250 MG PO TABS
250.0000 mg | ORAL_TABLET | ORAL | Status: DC
  Administered 2021-02-15 – 2021-02-20 (×3): 250 mg via ORAL
  Filled 2021-02-13 (×4): qty 1

## 2021-02-13 MED ORDER — NYSTATIN 100000 UNIT/ML MT SUSP
5.0000 mL | Freq: Four times a day (QID) | OROMUCOSAL | Status: DC
  Administered 2021-02-13 – 2021-02-21 (×20): 500000 [IU] via ORAL
  Filled 2021-02-13 (×23): qty 5

## 2021-02-13 MED ORDER — ZIDOVUDINE 100 MG PO CAPS
200.0000 mg | ORAL_CAPSULE | Freq: Once | ORAL | Status: AC
  Administered 2021-02-13: 200 mg via ORAL
  Filled 2021-02-13: qty 2

## 2021-02-13 MED ORDER — OXYCODONE-ACETAMINOPHEN 5-325 MG PO TABS
1.0000 | ORAL_TABLET | Freq: Four times a day (QID) | ORAL | Status: DC | PRN
  Administered 2021-02-17 – 2021-02-18 (×2): 1 via ORAL
  Filled 2021-02-13 (×2): qty 1

## 2021-02-13 MED ORDER — SACCHAROMYCES BOULARDII 250 MG PO CAPS
250.0000 mg | ORAL_CAPSULE | Freq: Two times a day (BID) | ORAL | Status: DC
  Administered 2021-02-13 – 2021-02-21 (×14): 250 mg via ORAL
  Filled 2021-02-13 (×15): qty 1

## 2021-02-13 MED ORDER — MEGESTROL ACETATE 400 MG/10ML PO SUSP
400.0000 mg | Freq: Every day | ORAL | Status: DC
  Administered 2021-02-13 – 2021-02-21 (×6): 400 mg via ORAL
  Filled 2021-02-13 (×13): qty 10

## 2021-02-13 MED ORDER — LACOSAMIDE 50 MG PO TABS
150.0000 mg | ORAL_TABLET | Freq: Two times a day (BID) | ORAL | Status: DC
  Administered 2021-02-13 – 2021-02-21 (×14): 150 mg via ORAL
  Filled 2021-02-13 (×15): qty 3

## 2021-02-13 MED ORDER — CALCITRIOL 0.5 MCG PO CAPS
1.2500 ug | ORAL_CAPSULE | ORAL | Status: DC
  Administered 2021-02-15 – 2021-02-20 (×3): 1.25 ug via ORAL
  Filled 2021-02-13 (×3): qty 1

## 2021-02-13 MED ORDER — DOLUTEGRAVIR SODIUM 50 MG PO TABS
50.0000 mg | ORAL_TABLET | Freq: Every day | ORAL | Status: DC
  Administered 2021-02-15: 50 mg via ORAL
  Filled 2021-02-13 (×2): qty 1

## 2021-02-13 MED ORDER — AMLODIPINE BESYLATE 10 MG PO TABS
10.0000 mg | ORAL_TABLET | Freq: Every day | ORAL | Status: DC
  Administered 2021-02-13 – 2021-02-21 (×6): 10 mg via ORAL
  Filled 2021-02-13 (×8): qty 1

## 2021-02-13 MED ORDER — DARBEPOETIN ALFA 40 MCG/0.4ML IJ SOSY
40.0000 ug | PREFILLED_SYRINGE | INTRAMUSCULAR | Status: DC
  Administered 2021-02-15: 40 ug via INTRAVENOUS
  Filled 2021-02-13 (×2): qty 0.4

## 2021-02-13 MED ORDER — ONDANSETRON HCL 4 MG PO TABS: 4.0000 mg | ORAL_TABLET | Freq: Four times a day (QID) | ORAL | Status: DC | PRN

## 2021-02-13 MED ORDER — VENLAFAXINE HCL 75 MG PO TABS
75.0000 mg | ORAL_TABLET | Freq: Two times a day (BID) | ORAL | Status: DC
  Administered 2021-02-13 – 2021-02-21 (×13): 75 mg via ORAL
  Filled 2021-02-13 (×17): qty 1

## 2021-02-13 MED ORDER — ONDANSETRON HCL 4 MG/2ML IJ SOLN
4.0000 mg | Freq: Four times a day (QID) | INTRAMUSCULAR | Status: DC | PRN
  Administered 2021-02-14: 4 mg via INTRAVENOUS
  Filled 2021-02-13: qty 2

## 2021-02-13 MED ORDER — LIDOCAINE HCL 4 % EX CREA: 1.0000 "application " | TOPICAL_CREAM | CUTANEOUS | Status: DC

## 2021-02-13 MED ORDER — LAMIVUDINE 10 MG/ML PO SOLN
50.0000 mg | Freq: Every day | ORAL | Status: DC
  Administered 2021-02-13: 50 mg via ORAL
  Filled 2021-02-13 (×5): qty 5

## 2021-02-13 MED ORDER — ZIDOVUDINE 100 MG PO CAPS
300.0000 mg | ORAL_CAPSULE | Freq: Every day | ORAL | Status: DC
  Administered 2021-02-15: 300 mg via ORAL
  Filled 2021-02-13 (×2): qty 3

## 2021-02-13 MED ORDER — ALBUTEROL SULFATE (2.5 MG/3ML) 0.083% IN NEBU: 2.5000 mg | INHALATION_SOLUTION | Freq: Four times a day (QID) | RESPIRATORY_TRACT | Status: DC | PRN

## 2021-02-13 MED ORDER — LAMIVUDINE 100 MG PO TABS: 50.0000 mg | ORAL_TABLET | Freq: Every day | ORAL | Status: DC

## 2021-02-13 MED ORDER — METOPROLOL TARTRATE 25 MG PO TABS
25.0000 mg | ORAL_TABLET | Freq: Two times a day (BID) | ORAL | Status: DC
  Administered 2021-02-13 – 2021-02-21 (×13): 25 mg via ORAL
  Filled 2021-02-13 (×15): qty 1

## 2021-02-13 MED ORDER — THIAMINE HCL 100 MG PO TABS
100.0000 mg | ORAL_TABLET | Freq: Every day | ORAL | Status: DC
  Administered 2021-02-13 – 2021-02-21 (×8): 100 mg via ORAL
  Filled 2021-02-13 (×8): qty 1

## 2021-02-13 MED ORDER — MIRTAZAPINE 15 MG PO TABS
15.0000 mg | ORAL_TABLET | Freq: Every day | ORAL | Status: DC
  Administered 2021-02-13 – 2021-02-20 (×7): 15 mg via ORAL
  Filled 2021-02-13 (×8): qty 1

## 2021-02-13 MED ORDER — ACETAMINOPHEN 325 MG PO TABS: 650.0000 mg | ORAL_TABLET | Freq: Four times a day (QID) | ORAL | Status: DC | PRN

## 2021-02-13 MED ORDER — CETAPHIL MOISTURIZING EX LOTN: 1.0000 "application " | TOPICAL_LOTION | Freq: Two times a day (BID) | CUTANEOUS | Status: DC | PRN

## 2021-02-13 NOTE — Care Management Obs Status (Signed)
[facility] NOTIFICATION   Patient Details  Name: [name] MRN: [id number] Date of Birth: 11-23-1950   Medicare Observation Status Notification Given:  Yes    Tom-Johnson, Renea Ee, RN 02/13/2021, 5:18 PM

## 2021-02-13 NOTE — ED Triage Notes (Signed)
Pt had new fistula placed and has wound vac to the area. Per facility this am wound vac filled with blood and per ems blood was on the floor. Pt c/o overall weakness.

## 2021-02-13 NOTE — Progress Notes (Signed)
Will adjust zidovudine to 300mg  qday for ESRD dose.  Onnie Boer, PharmD, BCIDP, AAHIVP, CPP Infectious Disease Pharmacist 02/13/2021 5:21 PM

## 2021-02-13 NOTE — ED Provider Notes (Addendum)
Patient transferred in from [facility] emergency department to be evaluated by vascular surgery.  Spoke with [name] he has been aware that the patient is here.  He is tied up at the moment.  And will be down to evaluate her.  He stated that if she needs that admission from the wound standpoint that will be a hospitalist admission.  If not then most likely patient will be able to go home.  Patient is from [facility].   Fredia Sorrow, MD 02/13/21 1309  Patient evaluated by vascular surgery.  They feel that she is oozing from her staple line.  Due to her anticoagulation.  Vascular recommends holding the anticoagulation treatment.  They want to follow the wound in the hospital recommending hospitalist admission.  Patient is a dialysis patient I will let nephrology know as well.  Normally dialyzed Tuesday Thursday Saturdays.  Would have not been dialyzed today.  Will get chest x-ray.  We will repeat H&H hemoglobin was 8 this morning.  Vascular surgery will follow the wound.  Patient during last admission she was discharged home on December 12 had repair of bleeding AV fistula.      Fredia Sorrow, MD 02/13/21 1323    Fredia Sorrow, MD 02/13/21 1331

## 2021-02-13 NOTE — ED Notes (Signed)
[facility] called for transport. 

## 2021-02-13 NOTE — H&P (Addendum)
History and Physical    [name] QMV:[id number] DOB: 11-29-1950 DOA: 02/13/2021  Referring MD/NP/PA: Fredia Sorrow, MD PCP: Hilbert Corrigan, MD  Patient coming from: Nursing facility via EMS  Chief Complaint: Bleeding from fistula  I have personally briefly reviewed patient's old medical records in [place]   HPI: [name] is a 70 y.o. female with medical history significant of ESRD on HD(TTS), atrial flutter on Eliquis, pulmonary hypertension, HIV, and GERD who presented in due to bleeding from her fistula.  She had just recently been hospitalized from 11/24 -12/12 after presenting having syncopal episode while undergoing HD.  Subsequently patient was noted to be hypertensive and had seizure for which MRI revealed concern for PRES requiring Cardene drip and transferred to ICU for continuous EEG monitoring.  During hospitalization patient was noted to have a pseudoaneurysm rupture of the right arm fistula taken to the OR emergently and underwent redo of the right brachial exposure and control of hemorrhage, harvest right greater saphenous vein, right brachial brachial bypass, and application of skin substitute to the right upper extremity.  Eliquis and Plavix were initially held and she required transfused 2 units of packed red blood cells on 12/6.  Hemoglobin had remained stable and Eliquis only was resumed on 12/7.  Since being back at the nursing facility patient reports that she has been having nausea, vomiting, and diarrhea.  Last night the staples blessed around her surgery site and she was bleeding everywhere.  She has had a small intermittent cough with whitish sputum reduction complains of some numbness in her hands.  ED Course: Upon admission into the emergency department patient was seen to be afebrile, pulse 93-1 7, respirations 17-32, blood pressures 113/89-145/82, and O2 saturations maintained on room air.  Labs significant for hemoglobin 8 potassium  3.7, BUN 32, creatinine 6.29, and calcium 7.8.  Vascular surgery had been consulted and evaluated patient.  They noted significant amount of hematoma underneath the VAC that was removed and VAC was recommended to be discontinued.  They recommended wet-to-dry dressings with gentle Ace wrap for compression and holding anticoagulation of Eliquis until it dries.  Nephrology had been consulted and [facility] called to admit.  Review of Systems  Constitutional:  Positive for malaise/fatigue.  Respiratory:  Positive for cough and sputum production. Negative for shortness of breath.   Cardiovascular:  Negative for leg swelling.  Gastrointestinal:  Positive for diarrhea, nausea and vomiting.  Neurological:  Positive for weakness.  Otherwise a 10 point review of systems was performed and negative except for as noted above in HPI   Past Medical History:  Diagnosis Date   Anemia    Anxiety    Atrial flutter ([place]) 02/22/2015   C. difficile colitis 10/10/2011   Chronic diarrhea    Chronic pain    Depression    ESRD on hemodialysis [facility])    Dialysis [name]   GERD (gastroesophageal reflux disease)    HIV (human immunodeficiency virus infection) ([place])    Hypertension    Insomnia    Intracranial hemorrhage (H[facility])    Muscle weakness (generalized)    Pulmonary HTN ([place])    Tachycardia    TIA (transient ischemic attack)    [facility] do not have this dx   Traumatic hematoma of right upper arm 02/22/2015    Past Surgical History:  Procedure Laterality Date   A/V FISTULAGRAM N/A 04/17/2016   Procedure: A/V Fistulagram - Right Upper;  Surgeon: Georgia Dom  Donzetta Matters, MD;  Location: [place] [facility];  Service: Cardiovascular;  Laterality: N/A;   A/V FISTULAGRAM Right 08/28/2019   Procedure: A/V FISTULAGRAM;  Surgeon: Waynetta Sandy, MD;  Location: [place] [facility];  Service: Cardiovascular;  Laterality: Right;   ABDOMINAL HYSTERECTOMY     APPLICATION OF A-CELL OF  BACK Right 02/03/2021   Procedure: APPLICATION OF MYRIAD MATRIX;  Surgeon: Cherre Robins, MD;  Location: [place];  Service: Vascular;  Laterality: Right;   APPLICATION OF WOUND VAC Right 01/07/2021   Procedure: APPLICATION OF WOUND VAC;  Surgeon: Waynetta Sandy, MD;  Location: [place];  Service: Vascular;  Laterality: Right;   APPLICATION OF WOUND VAC Right 02/03/2021   Procedure: APPLICATION OF WOUND VAC;  Surgeon: Cherre Robins, MD;  Location: [place];  Service: Vascular;  Laterality: Right;   BIOPSY  01/02/2020   Procedure: BIOPSY;  Surgeon: Eloise Harman, DO;  [facility];  Service: Endoscopy;;   BIOPSY  07/11/2020   Procedure: BIOPSY;  Surgeon: Irving Copas., MD;  Location: [place];  Service: Gastroenterology;;   BIOPSY THYROID     CARPAL TUNNEL RELEASE Right 11/16/2019   Procedure: CARPAL TUNNEL RELEASE;  Surgeon: Leanora Cover, MD;  Location: [place];  Service: Orthopedics;  Laterality: Right;   COLONOSCOPY WITH PROPOFOL N/A 01/02/2020   Surgeon: Eloise Harman, DO;nonbleeding internal hemorrhoids, diverticulosis, status post biopsies of the entire colon (benign). Due for repeat in 2026.   Dialysis Shunts     previous one removed from left arm and now present one in right arm   DIALYSIS/PERMA CATHETER INSERTION Right 04/17/2016   Procedure: dialysis Catheter Insertion central veinous;  Surgeon: Waynetta Sandy, MD;  Location: [place] [facility];  Service: Cardiovascular;  Laterality: Right;   ESOPHAGOGASTRODUODENOSCOPY  12/15/2010   [place]: erosive reflux esophagitis, MW tear, hiatal hernia (small), gastritis with no h.pylori, possibly nsaid related   ESOPHAGOGASTRODUODENOSCOPY (EGD) WITH PROPOFOL N/A 07/11/2020    Surgeon: Irving Copas., MD; normal esophagus, 4 cm hiatal hernia, gastritis biopsied (mild chronic gastritis, negative H. pylori), single duodenal polyp (Brunner's gland hyperplasia), s/p duodenal biopsy benign, duodenal  narrowing at the duodenal sweep.   EUS N/A 07/11/2020    Surgeon: Irving Copas., MD;  dilated CBD and common hepatic duct without stones or sludge in the bile duct, stones and biliary sludge in the gallbladder, pancreatic parenchyma with hyperechoic strands, pancreatic duct dilated with prominent sidebranches in the neck and body and tail, no pancreatic mass though visulalization limited, no ampullary lesion, no malignant appearing lymph node   EXCISION OF BREAST BIOPSY Right 05/04/2012   Procedure: EXCISION OF BREAST BIOPSY;  Surgeon: Donato Heinz, MD;  Location: [facility];  Service: General;  Laterality: Right;  Right Excisional Breast Biopsy   FISTULOGRAM Right 03/26/2014   Procedure: Right Arm Fistulogram with Venoplasty Right Subclavian Vein and Inominate Vein. Debridement Fistula Ulcer;  Surgeon: Conrad Kayenta, MD;  Location: [place];  Service: Vascular;  Laterality: Right;   FISTULOGRAM Right 03/29/2017   Procedure: FISTULOGRAM COMPLEX RIGHT ARM with Balloon angioplasty;  Surgeon: Waynetta Sandy, MD;  Location: [place];  Service: Vascular;  Laterality: Right;   HEMOSTASIS CLIP PLACEMENT  07/11/2020   Procedure: HEMOSTASIS CLIP PLACEMENT;  Surgeon: Irving Copas., MD;  Location: [place];  Service: Gastroenterology;;   INCISION AND DRAINAGE Right 12/27/2020   Procedure: INCISION AND DRAINAGE OF RIGHT FOREARM;  Surgeon: Waynetta Sandy, MD;  Location: [facility]  OR;  Service: Vascular;  Laterality: Right;   INCISION AND DRAINAGE Right 12/31/2020   Procedure: INCISION AND DRAINAGE RIGHT ARM WITH PLACEMNENT OF WOUND VAC;  Surgeon: Waynetta Sandy, MD;  Location: [place];  Service: Vascular;  Laterality: Right;   INCISION AND DRAINAGE Right 01/07/2021   Procedure: RIGHT ARM INCISION AND DRAINAGE;  Surgeon: Waynetta Sandy, MD;  Location: [place];  Service: Vascular;  Laterality: Right;   IR FLUORO GUIDE CV LINE LEFT  12/26/2020   IR FLUORO GUIDE CV  LINE LEFT  12/30/2020   IR GENERIC HISTORICAL  05/19/2016   IR REMOVAL TUN CV CATH W/O FL 05/19/2016 Markus Daft, MD [facility]-INTERV RAD   IR US GUIDE VASC ACCESS LEFT  12/26/2020   IR US GUIDE VASC ACCESS LEFT  12/30/2020   LIGATION OF ARTERIOVENOUS  FISTULA Right 11/12/2020   Procedure: LIGATION OF ARTERIOVENOUS  FISTULA WITH EXCISION OF NECROTIC TISSUE;  Surgeon: Waynetta Sandy, MD;  Location: [place];  Service: Vascular;  Laterality: Right;   LIGATION OF ARTERIOVENOUS  FISTULA Right 12/27/2020   Procedure: LIGATION OF ARTERIOVENOUS  FISTULA;  Surgeon: Waynetta Sandy, MD;  Location: [place];  Service: Vascular;  Laterality: Right;   PERIPHERAL VASCULAR BALLOON ANGIOPLASTY Right 04/17/2016   Procedure: Peripheral Vascular Balloon Angioplasty;  Surgeon: Waynetta Sandy, MD;  Location: [place] [facility];  Service: Cardiovascular;  Laterality: Right;  Central segment AV Fistula   PERIPHERAL VASCULAR BALLOON ANGIOPLASTY Right 03/14/2018   Procedure: PERIPHERAL VASCULAR BALLOON ANGIOPLASTY;  Surgeon: Waynetta Sandy, MD;  Location: [place] [facility];  Service: Cardiovascular;  Laterality: Right;  subclavian vein   PERIPHERAL VASCULAR BALLOON ANGIOPLASTY Right 08/28/2019   Procedure: PERIPHERAL VASCULAR BALLOON ANGIOPLASTY;  Surgeon: Waynetta Sandy, MD;  Location: [place] [facility];  Service: Cardiovascular;  Laterality: Right;  upper arm   PERIPHERAL VASCULAR BALLOON ANGIOPLASTY Right 11/11/2020   Procedure: PERIPHERAL VASCULAR BALLOON ANGIOPLASTY;  Surgeon: Waynetta Sandy, MD;  Location: [place] [facility];  Service: Cardiovascular;  Laterality: Right;   PERIPHERAL VASCULAR INTERVENTION Right 03/14/2018   Procedure: PERIPHERAL VASCULAR INTERVENTION;  Surgeon: Waynetta Sandy, MD;  Location: [place] [facility];  Service: Cardiovascular;  Laterality: Right;  subclavian vein   POLYPECTOMY  07/11/2020   Procedure: POLYPECTOMY;  Surgeon:  Mansouraty, Telford Nab., MD;  Location: [facility];  Service: Gastroenterology;;   REVISON OF ARTERIOVENOUS FISTULA Right 11/12/2020   Procedure: CREATION OF RIGHT ARM ARTERIOVENOUS FISTULA;  Surgeon: Waynetta Sandy, MD;  Location: [place];  Service: Vascular;  Laterality: Right;   REVISON OF ARTERIOVENOUS FISTULA Right 02/03/2021   Procedure: EXPLORATION OF RIGHT ARTERIOVENOUS FISTULA  AND RIGHT BRACHIAL TO BRACHIAL BYPASS;  Surgeon: Cherre Robins, MD;  Location: [place];  Service: Vascular;  Laterality: Right;   SHUNTOGRAM Right 10/19/2013   Procedure: FISTULOGRAM;  Surgeon: Conrad [place], MD;  Location: [facility] CATH LAB;  Service: Cardiovascular;  Laterality: Right;   SKIN SPLIT GRAFT Right 01/07/2021   Procedure: MYRIAD SKIN GRAFT PLACEMENT;  Surgeon: Waynetta Sandy, MD;  Location: [place];  Service: Vascular;  Laterality: Right;   TONSILLECTOMY     VEIN HARVEST Right 02/03/2021   Procedure: VEIN HARVEST OF UPPER RIGHT LEG;  Surgeon: Cherre Robins, MD;  Location: [place];  Service: Vascular;  Laterality: Right;   VISCERAL VENOGRAPHY Right 03/14/2018   Procedure: CENTRAL VENOGRAPHY;  Surgeon: Waynetta Sandy, MD;  Location: [place] [facility];  Service: Cardiovascular;  Laterality:  Right;  upper ext fistula     reports that she quit smoking about 37 years ago. Her smoking use included cigarettes. She has never used smokeless tobacco. She reports that she does not drink alcohol and does not use drugs.  Allergies  Allergen Reactions   Lactose Intolerance (Gi) Other (See Comments)    On MAR   Latex Rash and Itching   Penicillins Itching, Swelling and Rash    Has patient had a PCN reaction causing immediate rash, facial/tongue/throat swelling, SOB or lightheadedness with hypotension: No Has patient had a PCN reaction causing severe rash involving mucus membranes or skin necrosis: No Has patient had a PCN reaction that required hospitalization No Has patient had a PCN  reaction occurring within the last 10 years: No If all of the above answers are "NO", then may proceed with Cephalosporin use.      Family History  Problem Relation Age of Onset   Anesthesia problems Neg Hx    Hypotension Neg Hx    Malignant hyperthermia Neg Hx    Pseudochol deficiency Neg Hx    Colon cancer Neg Hx        Unsure of parents who both died when she was a baby    Prior to Admission medications   Medication Sig Start Date End Date Taking? Authorizing Provider  Amino Acids-Protein Hydrolys (PRO-STAT 64 PO) Take by mouth. Give 40m by mouth in the morning for wound healing    [provider]  amLODipine (NORVASC) 10 MG tablet Take 1 tablet (10 mg total) by mouth daily. 02/03/21   KBonnielee Haff MD  apixaban (ELIQUIS) 2.5 MG TABS tablet Take 1 tablet (2.5 mg total) by mouth 2 (two) times daily. 11/27/20   CUlyses Amor PA-C  baclofen (LIORESAL) 10 MG tablet Take 10 mg by mouth daily as needed for muscle spasms.    [provider]  calcitRIOL (ROCALTROL) 0.25 MCG capsule Take 5 capsules (1.25 mcg total) by mouth Every Tuesday,Thursday,and Saturday with dialysis. 02/04/21   KBonnielee Haff MD  calcium acetate (PHOSLO) 667 MG capsule Take 2 capsules (1,334 mg total) by mouth 3 (three) times daily with meals. 02/10/21   KBonnielee Haff MD  cinacalcet (SENSIPAR) 30 MG tablet Take 3 tablets (90 mg total) by mouth Every Tuesday,Thursday,and Saturday with dialysis. 02/04/21   KBonnielee Haff MD  collagenase (SANTYL) ointment Apply 1 application topically daily. Apply to sacrum topically every day shift for wound healing    [provider]  cyanocobalamin (,VITAMIN B-12,) 1000 MCG/ML injection Inject 1,000 mcg into the muscle every 30 (thirty) days. On the 28th of every month    [provider]  dolutegravir (TIVICAY) 50 MG tablet Take 50 mg by mouth daily at 4 PM.    [provider]  doxycycline (VIBRA-TABS) 100 MG tablet Take 1 tablet  (100 mg total) by mouth every 12 (twelve) hours. 02/13/21   KBonnielee Haff MD  Emollient ([facility] LOTN Apply 1 application topically every 12 (twelve) hours as needed (dry skin). Apply to feet and ankles    [provider]  lacosamide 150 MG TABS Take 1 tablet (150 mg total) by mouth 2 (two) times daily. 02/03/21   KBonnielee Haff MD  lamivudine (EPIVIR) 100 MG tablet Take 50 mg by mouth daily. (0800)    [provider]  levETIRAcetam (KEPPRA) 1000 MG tablet Take 1 tablet (1,000 mg total) by mouth daily. 02/03/21   KBonnielee Haff MD  levETIRAcetam (KEPPRA) 250 MG tablet Take 1  tablet (250 mg total) by mouth every Tuesday, Thursday, and Saturday at 6 PM. 02/04/21   Bonnielee Haff, MD  Lidocaine HCl 4 % CREA Apply 1 application topically every 12 (twelve) hours as needed (pain).    [provider]  lidocaine-prilocaine (EMLA) cream Apply 1 application topically See admin instructions. (0900) Apply to fistula site one hour before dialysis on Tuesday, Thursday, and Saturday.    [provider]  megestrol (MEGACE) 400 MG/10ML suspension Take 400 mg by mouth daily.    [provider]  metoprolol tartrate (LOPRESSOR) 25 MG tablet Take 1 tablet (25 mg total) by mouth 2 (two) times daily. 11/27/20   Ulyses Amor, PA-C  mirtazapine (REMERON) 15 MG tablet Take 15 mg by mouth at bedtime. 12/05/20   [provider]  Multiple Vitamin (MULTIVITAMIN WITH MINERALS) TABS tablet Take 1 tablet by mouth at bedtime. (2000)    [provider]  nitroGLYCERIN (NITROSTAT) 0.4 MG SL tablet Place 0.4 mg under the tongue every 5 (five) minutes x 3 doses as needed for chest pain.     [provider]  ondansetron (ZOFRAN ODT) 8 MG disintegrating tablet Take 1 tablet (8 mg total) by mouth every 8 (eight) hours as needed for nausea or vomiting. 12/16/20   Erenest Rasher, PA-C  oxyCODONE-acetaminophen (PERCOCET/ROXICET) 5-325 MG tablet Take 1-2 tablets by  mouth every 6 (six) hours as needed for moderate pain. 02/10/21   Bonnielee Haff, MD  pantoprazole (PROTONIX) 40 MG tablet Take 1 tablet (40 mg total) by mouth daily before breakfast. 12/16/20   Erenest Rasher, PA-C  polyethylene glycol (MIRALAX / GLYCOLAX) 17 g packet Take 17 g by mouth daily as needed for moderate constipation. 02/03/21   Bonnielee Haff, MD  senna (SENOKOT) 8.6 MG tablet Take 2 tablets by mouth every evening. (2100)    [provider]  thiamine 100 MG tablet Take 1 tablet (100 mg total) by mouth daily. 02/03/21   Bonnielee Haff, MD  venlafaxine (EFFEXOR) 75 MG tablet Take 1 tablet (75 mg total) by mouth 2 (two) times daily. 02/03/21   Bonnielee Haff, MD  vitamin C (ASCORBIC ACID) 500 MG tablet Take 500 mg by mouth 2 (two) times daily.    [provider]  zidovudine (RETROVIR) 100 MG capsule Take 100 mg by mouth 3 (three) times daily. (0900, 1300, & 2100)    [provider]    Physical Exam:  Constitutional: Chronically ill-appearing female in no acute distress Vitals:   02/13/21 0930 02/13/21 0946 02/13/21 1101 02/13/21 1231  BP: 111/79  121/78 119/76  Pulse: (!) 111 93 96 90  Resp: '17 16 14 14  ' Temp:   98.5 F (36.9 C) 98.3 F (36.8 C)  TempSrc:   Oral Oral  SpO2: 99% 99% 100% 100%  Weight:      Height:       Eyes: PERRL, lids and conjunctivae normal ENMT: Mucous membranes are moist. Posterior pharynx clear of any exudate or lesions.poor dentition.  Neck: normal, supple, no masses, no thyromegaly Respiratory: clear to auscultation bilaterally, no wheezing, no crackles. Normal respiratory effort.   Cardiovascular: Irregular rhythm with no significant lower extremity edema appreciated.   fistula left upper extremity without palpable thrill.  Left chest wall dialysis catheter in place. Abdomen: no tenderness, no masses palpated. No hepatosplenomegaly. Bowel sounds positive.  Musculoskeletal: no clubbing / cyanosis. No joint deformity  upper and lower extremities. Good ROM, no contractures. Normal muscle tone.  Skin: Surgical wound  of the right arm currently bandaged Neurologic: CN 2-12 grossly intact. Strength 5/5 in all 4.  Psychiatric: Normal judgment and insight. Alert and oriented x 3. Normal mood.     Labs on Admission: I have personally reviewed following labs and imaging studies  CBC: Recent Labs  Lab 02/07/21 0126 02/08/21 0151 02/09/21 0224 02/10/21 0709 02/13/21 0647  WBC 7.8 9.4 8.2 9.8 8.7  NEUTROABS  --   --   --   --  7.2  HGB 7.9* 8.8* 9.5* 9.6* 8.0*  HCT 25.3* 28.0* 29.9* 30.2* 26.7*  MCV 94.4 95.9 94.3 94.4 101.1*  PLT 137* 173 189 205 373   Basic Metabolic Panel: Recent Labs  Lab 02/08/21 0151 02/13/21 0647  NA 131* 139  K 5.0 3.7  CL 96* 102  CO2 24 24  GLUCOSE 96 118*  BUN 25* 32*  CREATININE 5.40* 6.29*  CALCIUM 7.5* 7.8*   GFR: Estimated Creatinine Clearance: 5.1 mL/min (A) (by C-G formula based on SCr of 6.29 mg/dL (H)). Liver Function Tests: No results for input(s): AST, ALT, ALKPHOS, BILITOT, PROT, ALBUMIN in the last 168 hours. No results for input(s): LIPASE, AMYLASE in the last 168 hours. No results for input(s): AMMONIA in the last 168 hours. Coagulation Profile: No results for input(s): INR, PROTIME in the last 168 hours. Cardiac Enzymes: No results for input(s): CKTOTAL, CKMB, CKMBINDEX, TROPONINI in the last 168 hours. BNP (last 3 results) No results for input(s): PROBNP in the last 8760 hours. HbA1C: No results for input(s): HGBA1C in the last 72 hours. CBG: Recent Labs  Lab 02/09/21 2349 02/10/21 0408 02/10/21 0822 02/10/21 1235 02/10/21 2048  GLUCAP 127* 85 68* 83 89   Lipid Profile: No results for input(s): CHOL, HDL, LDLCALC, TRIG, CHOLHDL, LDLDIRECT in the last 72 hours. Thyroid Function Tests: No results for input(s): TSH, T4TOTAL, FREET4, T3FREE, THYROIDAB in the last 72 hours. Anemia Panel: No results for input(s): VITAMINB12, FOLATE,  FERRITIN, TIBC, IRON, RETICCTPCT in the last 72 hours. Urine analysis:    Component Value Date/Time   COLORURINE YELLOW 01/19/2009 1855   APPEARANCEUR CLEAR 01/19/2009 1855   LABSPEC 1.015 01/19/2009 1855   PHURINE 7.5 01/19/2009 1855   GLUCOSEU NEGATIVE 01/19/2009 1855   HGBUR SMALL (A) 01/19/2009 1855   BILIRUBINUR NEGATIVE 01/19/2009 1855   KETONESUR NEGATIVE 01/19/2009 1855   PROTEINUR 100 (A) 01/19/2009 1855   UROBILINOGEN 0.2 01/19/2009 1855   NITRITE NEGATIVE 01/19/2009 1855   LEUKOCYTESUR NEGATIVE 01/19/2009 1855   Sepsis Labs: Recent Results (from the past 240 hour(s))  Resp Panel by RT-PCR (Flu A&B, Covid) Nasopharyngeal Swab     Status: None   Collection Time: 02/03/21  5:59 PM   Specimen: Nasopharyngeal Swab; Nasopharyngeal(NP) swabs in vial transport medium  Result Value Ref Range Status   SARS Coronavirus 2 by RT PCR NEGATIVE NEGATIVE Final    Comment: (NOTE) SARS-CoV-2 target nucleic acids are NOT DETECTED.  The SARS-CoV-2 RNA is generally detectable in upper respiratory specimens during the acute phase of infection. The lowest concentration of SARS-CoV-2 viral copies this assay can detect is 138 copies/mL. A negative result does not preclude SARS-Cov-2 infection and should not be used as the sole basis for treatment or other patient management decisions. A negative result may occur with  improper specimen collection/handling, submission of specimen other than nasopharyngeal swab, presence of viral mutation(s) within the areas targeted by this assay, and inadequate number of viral copies(<138 copies/mL). A negative result must be combined with clinical observations, patient  history, and epidemiological information. The expected result is Negative.  Fact Sheet for Patients:  EntrepreneurPulse.com.au  Fact Sheet for Healthcare Providers:  IncredibleEmployment.be  This test is no t yet approved or cleared by the [place] FDA and  has been authorized for detection and/or diagnosis of SARS-CoV-2 by FDA under an Emergency Use Authorization (EUA). This EUA will remain  in effect (meaning this test can be used) for the duration of the COVID-19 declaration under Section 564(b)(1) of the Act, 21 U.S.C.section 360bbb-3(b)(1), unless the authorization is terminated  or revoked sooner.       Influenza A by PCR NEGATIVE NEGATIVE Final   Influenza B by PCR NEGATIVE NEGATIVE Final    Comment: (NOTE) The Xpert Xpress SARS-CoV-2/FLU/RSV plus assay is intended as an aid in the diagnosis of influenza from Nasopharyngeal swab specimens and should not be used as a sole basis for treatment. Nasal washings and aspirates are unacceptable for Xpert Xpress SARS-CoV-2/FLU/RSV testing.  Fact Sheet for Patients: EntrepreneurPulse.com.au  Fact Sheet for Healthcare Providers: IncredibleEmployment.be  This test is not yet approved or cleared by the [place] FDA and has been authorized for detection and/or diagnosis of SARS-CoV-2 by FDA under an Emergency Use Authorization (EUA). This EUA will remain in effect (meaning this test can be used) for the duration of the COVID-19 declaration under Section 564(b)(1) of the Act, 21 U.S.C. section 360bbb-3(b)(1), unless the authorization is terminated or revoked.  Performed at [facility], [place] [address], [place], [place] [address]   Culture, blood (Routine X 2) w Reflex to ID Panel     Status: None   Collection Time: 02/08/21 11:34 AM   Specimen: BLOOD  Result Value Ref Range Status   Specimen Description BLOOD BLOOD LEFT HAND  Final   Special Requests   Final    BOTTLES DRAWN AEROBIC AND ANAEROBIC Blood Culture results may not be optimal due to an inadequate volume of blood received in culture bottles   Culture   Final    NO GROWTH 5 DAYS Performed at [facility], [place] [address], [place], [place] [address]     Report Status 02/13/2021 FINAL  Final  Culture, blood (Routine X 2) w Reflex to ID Panel     Status: None   Collection Time: 02/08/21 11:59 AM   Specimen: BLOOD  Result Value Ref Range Status   Specimen Description BLOOD BLOOD LEFT HAND  Final   Special Requests AEROBIC BOTTLE ONLY Blood Culture adequate volume  Final   Culture   Final    NO GROWTH 5 DAYS Performed at [facility], [place] [address], [place], [place] [address]    Report Status 02/13/2021 FINAL  Final  Resp Panel by RT-PCR (Flu A&B, Covid) Nasopharyngeal Swab     Status: None   Collection Time: 02/10/21 12:57 PM   Specimen: Nasopharyngeal Swab; Nasopharyngeal(NP) swabs in vial transport medium  Result Value Ref Range Status   SARS Coronavirus 2 by RT PCR NEGATIVE NEGATIVE Final    Comment: (NOTE) SARS-CoV-2 target nucleic acids are NOT DETECTED.  The SARS-CoV-2 RNA is generally detectable in upper respiratory specimens during the acute phase of infection. The lowest concentration of SARS-CoV-2 viral copies this assay can detect is 138 copies/mL. A negative result does not preclude SARS-Cov-2 infection and should not be used as the sole basis for treatment or other patient management decisions. A negative result may occur with  improper specimen collection/handling, submission of specimen other than  nasopharyngeal swab, presence of viral mutation(s) within the areas targeted by this assay, and inadequate number of viral copies(<138 copies/mL). A negative result must be combined with clinical observations, patient history, and epidemiological information. The expected result is Negative.  Fact Sheet for Patients:  EntrepreneurPulse.com.au  Fact Sheet for Healthcare Providers:  IncredibleEmployment.be  This test is no t yet approved or cleared by the [place] FDA and  has been authorized for detection and/or diagnosis of SARS-CoV-2 by FDA under an Emergency Use  Authorization (EUA). This EUA will remain  in effect (meaning this test can be used) for the duration of the COVID-19 declaration under Section 564(b)(1) of the Act, 21 U.S.C.section 360bbb-3(b)(1), unless the authorization is terminated  or revoked sooner.       Influenza A by PCR NEGATIVE NEGATIVE Final   Influenza B by PCR NEGATIVE NEGATIVE Final    Comment: (NOTE) The Xpert Xpress SARS-CoV-2/FLU/RSV plus assay is intended as an aid in the diagnosis of influenza from Nasopharyngeal swab specimens and should not be used as a sole basis for treatment. Nasal washings and aspirates are unacceptable for Xpert Xpress SARS-CoV-2/FLU/RSV testing.  Fact Sheet for Patients: EntrepreneurPulse.com.au  Fact Sheet for Healthcare Providers: IncredibleEmployment.be  This test is not yet approved or cleared by the [place] FDA and has been authorized for detection and/or diagnosis of SARS-CoV-2 by FDA under an Emergency Use Authorization (EUA). This EUA will remain in effect (meaning this test can be used) for the duration of the COVID-19 declaration under Section 564(b)(1) of the Act, 21 U.S.C. section 360bbb-3(b)(1), unless the authorization is terminated or revoked.  Performed at [facility], [place] [address], [place], [place] [address]   Resp Panel by RT-PCR (Flu A&B, Covid) Nasopharyngeal Swab     Status: None   Collection Time: 02/13/21  8:16 AM   Specimen: Nasopharyngeal Swab; Nasopharyngeal(NP) swabs in vial transport medium  Result Value Ref Range Status   SARS Coronavirus 2 by RT PCR NEGATIVE NEGATIVE Final    Comment: (NOTE) SARS-CoV-2 target nucleic acids are NOT DETECTED.  The SARS-CoV-2 RNA is generally detectable in upper respiratory specimens during the acute phase of infection. The lowest concentration of SARS-CoV-2 viral copies this assay can detect is 138 copies/mL. A negative result does not preclude SARS-Cov-2 infection  and should not be used as the sole basis for treatment or other patient management decisions. A negative result may occur with  improper specimen collection/handling, submission of specimen other than nasopharyngeal swab, presence of viral mutation(s) within the areas targeted by this assay, and inadequate number of viral copies(<138 copies/mL). A negative result must be combined with clinical observations, patient history, and epidemiological information. The expected result is Negative.  Fact Sheet for Patients:  EntrepreneurPulse.com.au  Fact Sheet for Healthcare Providers:  IncredibleEmployment.be  This test is no t yet approved or cleared by the [place] FDA and  has been authorized for detection and/or diagnosis of SARS-CoV-2 by FDA under an Emergency Use Authorization (EUA). This EUA will remain  in effect (meaning this test can be used) for the duration of the COVID-19 declaration under Section 564(b)(1) of the Act, 21 U.S.C.section 360bbb-3(b)(1), unless the authorization is terminated  or revoked sooner.       Influenza A by PCR NEGATIVE NEGATIVE Final   Influenza B by PCR NEGATIVE NEGATIVE Final    Comment: (NOTE) The Xpert Xpress SARS-CoV-2/FLU/RSV plus assay is intended as an aid in the diagnosis of influenza from Nasopharyngeal swab specimens  and should not be used as a sole basis for treatment. Nasal washings and aspirates are unacceptable for Xpert Xpress SARS-CoV-2/FLU/RSV testing.  Fact Sheet for Patients: EntrepreneurPulse.com.au  Fact Sheet for Healthcare Providers: IncredibleEmployment.be  This test is not yet approved or cleared by the [place] FDA and has been authorized for detection and/or diagnosis of SARS-CoV-2 by FDA under an Emergency Use Authorization (EUA). This EUA will remain in effect (meaning this test can be used) for the duration of the COVID-19 declaration  under Section 564(b)(1) of the Act, 21 U.S.C. section 360bbb-3(b)(1), unless the authorization is terminated or revoked.  Performed at [facility], [address], [place], [place] [address]      Radiological Exams on Admission: No results found.  EKG: Independently reviewed.  Atrial flutter at 93 bpm  Assessment/Plan  Bleeding from wound: Acute.  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  Vascular surgery consulted discontinued VAC, put in orders for wound care, and recommended holding anticoagulation in the acute setting. -Admit to medical telemetry bed -Holding anticoagulation per recommends -Dressing changes per vascular surgery -Appreciate plastic surgery consultative services,  will follow-up for any further recommendation  Acute blood loss anemia/anemia of chronic disease: Hemoglobin noted to be down to 8 g/dL, but previously had been 9.6 g/dL when she was discharged from the hospital on 12/12.  Suspect secondary to above. -Type screen for possible need for blood product -Continue to monitor H&H and transfuse blood products as needed -Repeat H&H was 6.6.  Order placed to transfuse 2 units of PRBCs  Atrial flutter on chronic anticoagulation: Patient last took Eliquis yesterday, but in setting of acute bleeding advised to hold for vascular surgery. -Hold Eliquis -Continue metoprolol as tolerated  ESRD on HD: Patient on Tuesday, Thursday, Saturday schedule.  Plans to dialyze tomorrow. -Appreciate nephrology, will follow-up for any further  Essential hypertension: Blood pressures currently maintained 110/72-145/82.  Home blood pressure medication regimen includes amlodipine 10 mg daily and metoprolol 25 mg twice daily. -Continue blood pressure regimen as tolerated  Nausea, vomiting, and diarrhea: Acute.  Patient reports having diarrhea over the last few days, but records note  had been on Senokot daily. -Strict intake and output -Hold Senokot  -Probiotic -May warrant further testing if diarrhea persist -Antiemetics as needed  Seizure recent seizure in the setting of PRES: Noted during last hospitalization for which patient had been placed on a Cardene drip in the ICU. -Continue Keppra and Vimpat  HIV -Continue current HAART medication  GERD -Continue PPI  DNR  DVT prophylaxis: SCD Code Status: DNR Family Communication: Left message on patient's daughter's voicemail Disposition Plan: Likely discharge back to SNF Consults called: Vascular surgery, nephrology Admission status: Observation  Norval Morton MD [facility]   If 7PM-7AM, please contact night-coverage   02/13/2021, 1:49 PM

## 2021-02-13 NOTE — ED Notes (Signed)
[facility] at bedside 

## 2021-02-13 NOTE — ED Notes (Signed)
[facility] leaving with pt at this time.

## 2021-02-13 NOTE — ED Provider Notes (Signed)
[facility] Provider Note   CSN: [id number] Arrival date & time: 02/13/21  0524     History Chief Complaint  Patient presents with   Coagulation Disorder    [name] is a 70 y.o. female.  The history is provided by the patient.  She has history of hypertension, HIV, C. difficile, atrial fibrillation and flutter anticoagulated on apixaban, end-stage renal disease on hemodialysis and she recently had a wound exploration and washout with placement of a wound VAC on her right arm.  She was transferred from her skilled nursing facility because of bleeding from that site.  The wound VAC was noted to be full and blood was noted to be on the floor.  Patient is complaining of ongoing diarrhea but not really complaining of pain or weakness.   Past Medical History:  Diagnosis Date   Anemia    Anxiety    Atrial flutter ([place]) 02/22/2015   C. difficile colitis 10/10/2011   Chronic diarrhea    Chronic pain    Depression    ESRD on hemodialysis [facility])    Dialysis [name]   GERD (gastroesophageal reflux disease)    HIV (human immunodeficiency virus infection) ([place])    Hypertension    Insomnia    Intracranial hemorrhage (HCC)    Muscle weakness (generalized)    Pulmonary HTN ([place])    Tachycardia    TIA (transient ischemic attack)    [place] do not have this dx   Traumatic hematoma of right upper arm 02/22/2015    Patient Active Problem List   Diagnosis Date Noted   Thrombocytopenia ([place]) 01/30/2021   Hyponatremia 01/30/2021   Acute delirium    Hyperthyroidism 01/25/2021   Seizure ([place]) 01/25/2021   MRSA infection 01/25/2021   PRES (posterior reversible encephalopathy syndrome)    Hypertensive emergency    Hypoglycemia 12/25/2020   Infected fluid collection with fistula 12/24/2020   Dysphagia 12/16/2020   Loss of weight 12/16/2020   Abdominal pain, epigastric 12/16/2020   Protein-calorie malnutrition, severe 11/20/2020    Chest pain of uncertain etiology    Altered mental status    ESRD (end stage renal disease) ([place]) 11/11/2020   Pancreatic abnormality 08/05/2020   Gastritis without bleeding 08/05/2020   Dilated pancreatic duct 05/15/2020   Abnormal MRI of abdomen 05/15/2020   Common bile duct dilation    Lobar pneumonia ([place]) 07/20/2019   Acute respiratory failure with hypoxia ([place]) 07/20/2019   Colitis 07/19/2019   Dilation of common bile duct 07/19/2019   Ventral hernia without obstruction or gangrene    Hypocalcemia 03/07/2019   Other pancytopenia ([place]) 12/08/2018   Macrocytic anemia 04/17/2016   Hepatitis B immune 01/28/2016   Atrial flutter ([place]) 02/22/2015   Hyperkalemia 02/20/2015   Venous stenosis of right upper extremity 04/06/2014   ESRD on dialysis ([place]) 04/06/2014   Essential hypertension    Nausea and vomiting 12/15/2012   CHF, chronic ([place]) 11/25/2012   Coronary artery disease 09/15/2012   History of stroke 09/15/2012   Anxiety 09/15/2012   Atrial fibrillation ([place]) 05/02/2012   Goiter 04/08/2012   Intracranial bleed ([place]) 12/28/2011   Angioedema of lips 09/21/2011   Cellulitis of breast 03/09/2011   Erosive esophagitis 12/15/2010   Mallory - Weiss tear 12/15/2010   UGI bleed 12/13/2010   DIARRHEA 04/12/2009   Human immunodeficiency virus (HIV) disease ([place]) 11/15/2008   PANCREATITIS, HX OF 11/15/2008   TOBACCO USE, QUIT 11/15/2008   PAIN IN JOINT,  UPPER ARM 08/07/2008   ABDOMINAL WALL HERNIA 04/26/2007   BRONCHITIS, ACUTE 11/16/2006   HYPOKALEMIA, HX OF 07/13/2006   ANEMIA, NORMOCYTIC, CHRONIC 03/23/2006   DEPRESSION 03/23/2006   Gastroesophageal reflux disease 03/23/2006   PANCREATITIS 03/23/2006   MASS, RIGHT AXILLA 03/23/2006   HEADACHE 03/23/2006   SYMPTOM, ENLARGEMENT, LYMPH NODES 03/23/2006   SHINGLES, HX OF 03/23/2006   HYSTERECTOMY, TOTAL, HX OF 03/23/2006    Past Surgical History:  Procedure Laterality Date   A/V FISTULAGRAM N/A 04/17/2016   Procedure:  A/V Fistulagram - Right Upper;  Surgeon: Waynetta Sandy, MD;  Location: [facility];  Service: Cardiovascular;  Laterality: N/A;   A/V FISTULAGRAM Right 08/28/2019   Procedure: A/V FISTULAGRAM;  Surgeon: Waynetta Sandy, MD;  Location: [place] [facility];  Service: Cardiovascular;  Laterality: Right;   ABDOMINAL HYSTERECTOMY     APPLICATION OF A-CELL OF BACK Right 02/03/2021   Procedure: APPLICATION OF MYRIAD MATRIX;  Surgeon: Cherre Robins, MD;  Location: [place];  Service: Vascular;  Laterality: Right;   APPLICATION OF WOUND VAC Right 01/07/2021   Procedure: APPLICATION OF WOUND VAC;  Surgeon: Waynetta Sandy, MD;  Location: [place];  Service: Vascular;  Laterality: Right;   APPLICATION OF WOUND VAC Right 02/03/2021   Procedure: APPLICATION OF WOUND VAC;  Surgeon: Cherre Robins, MD;  Location: [place];  Service: Vascular;  Laterality: Right;   BIOPSY  01/02/2020   Procedure: BIOPSY;  Surgeon: Eloise Harman, DO;  [facility];  Service: Endoscopy;;   BIOPSY  07/11/2020   Procedure: BIOPSY;  Surgeon: Irving Copas., MD;  Location: [place];  Service: Gastroenterology;;   BIOPSY THYROID     CARPAL TUNNEL RELEASE Right 11/16/2019   Procedure: CARPAL TUNNEL RELEASE;  Surgeon: Leanora Cover, MD;  Location: [place];  Service: Orthopedics;  Laterality: Right;   COLONOSCOPY WITH PROPOFOL N/A 01/02/2020   Surgeon: Eloise Harman, DO;nonbleeding internal hemorrhoids, diverticulosis, status post biopsies of the entire colon (benign). Due for repeat in 2026.   Dialysis Shunts     previous one removed from left arm and now present one in right arm   DIALYSIS/PERMA CATHETER INSERTION Right 04/17/2016   Procedure: dialysis Catheter Insertion central veinous;  Surgeon: Waynetta Sandy, MD;  Location: [place] [facility];  Service: Cardiovascular;  Laterality: Right;   ESOPHAGOGASTRODUODENOSCOPY  12/15/2010   [place]k: erosive reflux  esophagitis, MW tear, hiatal hernia (small), gastritis with no h.pylori, possibly nsaid related   ESOPHAGOGASTRODUODENOSCOPY (EGD) WITH PROPOFOL N/A 07/11/2020    Surgeon: Irving Copas., MD; normal esophagus, 4 cm hiatal hernia, gastritis biopsied (mild chronic gastritis, negative H. pylori), single duodenal polyp (Brunner's gland hyperplasia), s/p duodenal biopsy benign, duodenal narrowing at the duodenal sweep.   EUS N/A 07/11/2020    Surgeon: Irving Copas., MD;  dilated CBD and common hepatic duct without stones or sludge in the bile duct, stones and biliary sludge in the gallbladder, pancreatic parenchyma with hyperechoic strands, pancreatic duct dilated with prominent sidebranches in the neck and body and tail, no pancreatic mass though visulalization limited, no ampullary lesion, no malignant appearing lymph node   EXCISION OF BREAST BIOPSY Right 05/04/2012   Procedure: EXCISION OF BREAST BIOPSY;  Surgeon: Donato Heinz, MD;  Location: [facility];  Service: General;  Laterality: Right;  Right Excisional Breast Biopsy   FISTULOGRAM Right 03/26/2014   Procedure: Right Arm Fistulogram with Venoplasty Right Subclavian Vein and Inominate Vein. Debridement Fistula Ulcer;  Surgeon: Conrad [place] Lodge, MD;  Location: [facility];  Service: Vascular;  Laterality: Right;   FISTULOGRAM Right 03/29/2017   Procedure: FISTULOGRAM COMPLEX RIGHT ARM with Balloon angioplasty;  Surgeon: Waynetta Sandy, MD;  Location: [place];  Service: Vascular;  Laterality: Right;   HEMOSTASIS CLIP PLACEMENT  07/11/2020   Procedure: HEMOSTASIS CLIP PLACEMENT;  Surgeon: Irving Copas., MD;  Location: [place];  Service: Gastroenterology;;   INCISION AND DRAINAGE Right 12/27/2020   Procedure: INCISION AND DRAINAGE OF RIGHT FOREARM;  Surgeon: Waynetta Sandy, MD;  Location: [place];  Service: Vascular;  Laterality: Right;   INCISION AND DRAINAGE Right 12/31/2020   Procedure: INCISION AND DRAINAGE  RIGHT ARM WITH PLACEMNENT OF WOUND VAC;  Surgeon: Waynetta Sandy, MD;  Location: [place];  Service: Vascular;  Laterality: Right;   INCISION AND DRAINAGE Right 01/07/2021   Procedure: RIGHT ARM INCISION AND DRAINAGE;  Surgeon: Waynetta Sandy, MD;  Location: [place];  Service: Vascular;  Laterality: Right;   IR FLUORO GUIDE CV LINE LEFT  12/26/2020   IR FLUORO GUIDE CV LINE LEFT  12/30/2020   IR GENERIC HISTORICAL  05/19/2016   IR REMOVAL TUN CV CATH W/O FL 05/19/2016 Markus Daft, MD [facility]-INTERV RAD   IR US GUIDE VASC ACCESS LEFT  12/26/2020   IR US GUIDE VASC ACCESS LEFT  12/30/2020   LIGATION OF ARTERIOVENOUS  FISTULA Right 11/12/2020   Procedure: LIGATION OF ARTERIOVENOUS  FISTULA WITH EXCISION OF NECROTIC TISSUE;  Surgeon: Waynetta Sandy, MD;  Location: [facility];  Service: Vascular;  Laterality: Right;   LIGATION OF ARTERIOVENOUS  FISTULA Right 12/27/2020   Procedure: LIGATION OF ARTERIOVENOUS  FISTULA;  Surgeon: Waynetta Sandy, MD;  Location: [facility];  Service: Vascular;  Laterality: Right;   PERIPHERAL VASCULAR BALLOON ANGIOPLASTY Right 04/17/2016   Procedure: Peripheral Vascular Balloon Angioplasty;  Surgeon: Waynetta Sandy, MD;  Location: [place];  Service: Cardiovascular;  Laterality: Right;  Central segment AV Fistula   PERIPHERAL VASCULAR BALLOON ANGIOPLASTY Right 03/14/2018   Procedure: PERIPHERAL VASCULAR BALLOON ANGIOPLASTY;  Surgeon: Waynetta Sandy, MD;  Location: [place] [facility];  Service: Cardiovascular;  Laterality: Right;  subclavian vein   PERIPHERAL VASCULAR BALLOON ANGIOPLASTY Right 08/28/2019   Procedure: PERIPHERAL VASCULAR BALLOON ANGIOPLASTY;  Surgeon: Waynetta Sandy, MD;  Location: [place] [facility];  Service: Cardiovascular;  Laterality: Right;  upper arm   PERIPHERAL VASCULAR BALLOON ANGIOPLASTY Right 11/11/2020   Procedure: PERIPHERAL VASCULAR BALLOON ANGIOPLASTY;  Surgeon: Waynetta Sandy, MD;  Location: [place] [facility];  Service: Cardiovascular;  Laterality: Right;   PERIPHERAL VASCULAR INTERVENTION Right 03/14/2018   Procedure: PERIPHERAL VASCULAR INTERVENTION;  Surgeon: Waynetta Sandy, MD;  Location: [place] [facility];  Service: Cardiovascular;  Laterality: Right;  subclavian vein   POLYPECTOMY  07/11/2020   Procedure: POLYPECTOMY;  Surgeon: Mansouraty, Telford Nab., MD;  Location: [facility];  Service: Gastroenterology;;   REVISON OF ARTERIOVENOUS FISTULA Right 11/12/2020   Procedure: CREATION OF RIGHT ARM ARTERIOVENOUS FISTULA;  Surgeon: Waynetta Sandy, MD;  Location: [place];  Service: Vascular;  Laterality: Right;   REVISON OF ARTERIOVENOUS FISTULA Right 02/03/2021   Procedure: EXPLORATION OF RIGHT ARTERIOVENOUS FISTULA  AND RIGHT BRACHIAL TO BRACHIAL BYPASS;  Surgeon: Cherre Robins, MD;  Location: [place];  Service: Vascular;  Laterality: Right;   SHUNTOGRAM Right 10/19/2013   Procedure: FISTULOGRAM;  Surgeon: Conrad Crary, MD;  Location: [facility] C[facility] LAB;  Service: Cardiovascular;  Laterality: Right;   SKIN SPLIT GRAFT Right 01/07/2021   Procedure: MYRIAD SKIN GRAFT PLACEMENT;  Surgeon: Waynetta Sandy, MD;  Location: [place];  Service: Vascular;  Laterality: Right;   TONSILLECTOMY     VEIN HARVEST Right 02/03/2021   Procedure: VEIN HARVEST OF UPPER RIGHT LEG;  Surgeon: Cherre Robins, MD;  Location: [place];  Service: Vascular;  Laterality: Right;   VISCERAL VENOGRAPHY Right 03/14/2018   Procedure: CENTRAL VENOGRAPHY;  Surgeon: Waynetta Sandy, MD;  Location: [place] [facility];  Service: Cardiovascular;  Laterality: Right;  upper ext fistula     OB History     Gravida  1   Para  1   Term  1   Preterm      AB      Living  1      SAB      IAB      Ectopic      Multiple      Live Births              Family History  Problem Relation Age of Onset   Anesthesia problems Neg Hx    Hypotension Neg  Hx    Malignant hyperthermia Neg Hx    Pseudochol deficiency Neg Hx    Colon cancer Neg Hx        Unsure of parents who both died when she was a baby    Social History   Tobacco Use   Smoking status: Former    Types: Cigarettes    Quit date: 03/12/1983    Years since quitting: 37.9   Smokeless tobacco: Never  Vaping Use   Vaping Use: Never used  Substance Use Topics   Alcohol use: No    Alcohol/week: 0.0 standard drinks   Drug use: No    Home Medications Prior to Admission medications   Medication Sig Start Date End Date Taking? Authorizing Provider  Amino Acids-Protein Hydrolys (PRO-STAT 64 PO) Take by mouth. Give 23m by mouth in the morning for wound healing    [provider]  amLODipine (NORVASC) 10 MG tablet Take 1 tablet (10 mg total) by mouth daily. 02/03/21   KBonnielee Haff MD  apixaban (ELIQUIS) 2.5 MG TABS tablet Take 1 tablet (2.5 mg total) by mouth 2 (two) times daily. 11/27/20   CUlyses Amor PA-C  baclofen (LIORESAL) 10 MG tablet Take 10 mg by mouth daily as needed for muscle spasms.    [provider]  calcitRIOL (ROCALTROL) 0.25 MCG capsule Take 5 capsules (1.25 mcg total) by mouth Every Tuesday,Thursday,and Saturday with dialysis. 02/04/21   KBonnielee Haff MD  calcium acetate (PHOSLO) 667 MG capsule Take 2 capsules (1,334 mg total) by mouth 3 (three) times daily with meals. 02/10/21   KBonnielee Haff MD  cinacalcet (SENSIPAR) 30 MG tablet Take 3 tablets (90 mg total) by mouth Every Tuesday,Thursday,and Saturday with dialysis. 02/04/21   KBonnielee Haff MD  collagenase (SANTYL) ointment Apply 1 application topically daily. Apply to sacrum topically every day shift for wound healing    [provider]  cyanocobalamin (,VITAMIN B-12,) 1000 MCG/ML injection Inject 1,000 mcg into the muscle every 30 (thirty) days. On the 28th of every month    [provider]  dolutegravir (TIVICAY) 50 MG tablet Take 50 mg by mouth daily at 4 PM.     [provider]  doxycycline (VIBRA-TABS) 100 MG tablet Take 1 tablet (100 mg total) by mouth every 12 (twelve) hours. 02/13/21  Bonnielee Haff, MD  [employer]) LOTN Apply 1 application topically every 12 (twelve) hours as needed (dry skin). Apply to feet and ankles    [provider]  lacosamide 150 MG TABS Take 1 tablet (150 mg total) by mouth 2 (two) times daily. 02/03/21   Bonnielee Haff, MD  lamivudine (EPIVIR) 100 MG tablet Take 50 mg by mouth daily. (0800)    [provider]  levETIRAcetam (KEPPRA) 1000 MG tablet Take 1 tablet (1,000 mg total) by mouth daily. 02/03/21   Bonnielee Haff, MD  levETIRAcetam (KEPPRA) 250 MG tablet Take 1 tablet (250 mg total) by mouth every Tuesday, Thursday, and Saturday at 6 PM. 02/04/21   Bonnielee Haff, MD  Lidocaine HCl 4 % CREA Apply 1 application topically every 12 (twelve) hours as needed (pain).    [provider]  lidocaine-prilocaine (EMLA) cream Apply 1 application topically See admin instructions. (0900) Apply to fistula site one hour before dialysis on Tuesday, Thursday, and Saturday.    [provider]  megestrol (MEGACE) 400 MG/10ML suspension Take 400 mg by mouth daily.    [provider]  metoprolol tartrate (LOPRESSOR) 25 MG tablet Take 1 tablet (25 mg total) by mouth 2 (two) times daily. 11/27/20   Ulyses Amor, PA-C  mirtazapine (REMERON) 15 MG tablet Take 15 mg by mouth at bedtime. 12/05/20   [provider]  Multiple Vitamin (MULTIVITAMIN WITH MINERALS) TABS tablet Take 1 tablet by mouth at bedtime. (2000)    [provider]  nitroGLYCERIN (NITROSTAT) 0.4 MG SL tablet Place 0.4 mg under the tongue every 5 (five) minutes x 3 doses as needed for chest pain.     [provider]  ondansetron (ZOFRAN ODT) 8 MG disintegrating tablet Take 1 tablet (8 mg total) by mouth every 8 (eight) hours as needed for nausea or vomiting. 12/16/20   Erenest Rasher, PA-C   oxyCODONE-acetaminophen (PERCOCET/ROXICET) 5-325 MG tablet Take 1-2 tablets by mouth every 6 (six) hours as needed for moderate pain. 02/10/21   Bonnielee Haff, MD  pantoprazole (PROTONIX) 40 MG tablet Take 1 tablet (40 mg total) by mouth daily before breakfast. 12/16/20   Erenest Rasher, PA-C  polyethylene glycol (MIRALAX / GLYCOLAX) 17 g packet Take 17 g by mouth daily as needed for moderate constipation. 02/03/21   Bonnielee Haff, MD  senna (SENOKOT) 8.6 MG tablet Take 2 tablets by mouth every evening. (2100)    [provider]  thiamine 100 MG tablet Take 1 tablet (100 mg total) by mouth daily. 02/03/21   Bonnielee Haff, MD  venlafaxine (EFFEXOR) 75 MG tablet Take 1 tablet (75 mg total) by mouth 2 (two) times daily. 02/03/21   Bonnielee Haff, MD  vitamin C (ASCORBIC ACID) 500 MG tablet Take 500 mg by mouth 2 (two) times daily.    [provider]  zidovudine (RETROVIR) 100 MG capsule Take 100 mg by mouth 3 (three) times daily. (0900, 1300, & 2100)    [provider]    Allergies    Lactose intolerance (gi), Latex, and Penicillins  Review of Systems   Review of Systems  All other systems reviewed and are negative.  Physical Exam Updated Vital Signs BP (!) 145/82    Pulse 93    Temp 97.6 F (36.4 C) (Oral)    Resp 17    Ht '5\' 2"'  (1.575 m)    Wt 38 kg    LMP  (LMP Unknown)    SpO2 98%  BMI 15.32 kg/m   Physical Exam Vitals and nursing note reviewed.  70 year old female, resting comfortably and in no acute distress. Vital signs are significant for elevated blood pressure. Oxygen saturation is 98%, which is normal. Head is normocephalic and atraumatic. PERRLA, EOMI. Oropharynx is clear. Neck is nontender and supple without adenopathy or JVD. Back is nontender and there is no CVA tenderness. Lungs are clear without rales, wheezes, or rhonchi. Chest is nontender.  Dialysis access catheters are present on the left. Heart has regular rate and rhythm without  murmur. Abdomen is soft, flat, nontender. Extremities: Dressing is present on the right upper arm with wound VAC in place which is three quarters filled with blood.  Clotted fistula they are present in the left upper arm. Skin is warm and dry without rash. Neurologic: Awake and alert, cranial nerves are intact, moves all extremities equally.  ED Results / Procedures / Treatments   Labs (all labs ordered are listed, but only abnormal results are displayed) Labs Reviewed  BASIC METABOLIC PANEL - Abnormal; Notable for the following components:      Result Value   Glucose, Bld 118 (*)    BUN 32 (*)    Creatinine, Ser 6.29 (*)    Calcium 7.8 (*)    GFR, Estimated 7 (*)    All other components within normal limits  CBC WITH DIFFERENTIAL/PLATELET - Abnormal; Notable for the following components:   RBC 2.64 (*)    Hemoglobin 8.0 (*)    HCT 26.7 (*)    MCV 101.1 (*)    RDW 20.6 (*)    All other components within normal limits  C DIFFICILE QUICK SCREEN W PCR REFLEX    RESP PANEL BY RT-PCR (FLU A&B, COVID) [id number]    EKG EKG Interpretation  Date/Time:  Thursday February 13 2021 05:35:35 EST Ventricular Rate:  93 PR Interval:    QRS Duration: 92 QT Interval:  354 QTC Calculation: 441 R Axis:   -38 Text Interpretation: Atrial flutter with predominant 3:1 AV block Left axis deviation Nonspecific repol abnormality, diffuse leads When compared with ECG of 02/03/2021, has changed has increased Confirmed by Delora Fuel ([id number]) on 02/13/2021 5:46:31 AM  Procedures Procedures   Medications Ordered in ED Medications - No data to display  ED Course  I have reviewed the triage vital signs and the nursing notes.  Pertinent lab results that were available during my care of the patient were reviewed by me and considered in my medical decision making (see chart for details).   MDM Rules/Calculators/A&P                         Bleeding from wound VAC from site of wound washout right upper  arm.  Old records reviewed confirming washout of right upper arm wound on 12/5 with placement of wound VAC.  There is no active bleeding currently, will check hemoglobin.  We will need to consult with her vascular surgeon once hemoglobin is back.  Also, because of ongoing diarrhea and history of clustering difficile, will send stool specimen to check again for C. difficile.  ECG was obtained showing atrial flutter, unchanged from prior.  Labs show hemoglobin of 8.0, which is a drop of 1.6 g compared with 3 days ago.  Case is discussed with [name], on-call for vascular surgery, who request patient be transferred to [facility] emergency department for him to evaluate there.  Case discussed with Dr. Tinnie Gens,  ED physician at [facility] who accepts the patient in transfer.  Final Clinical Impression(s) / ED Diagnoses Final diagnoses:  Bleeding from wound  End-stage renal disease on hemodialysis ([place])  Anemia, blood loss    Rx / DC Orders ED Discharge Orders     None        Delora Fuel, MD 39/35/94 [phone]

## 2021-02-13 NOTE — ED Notes (Signed)
Upon assessment pt had bowel movement. This nurse and nurse tech provided peri care, placed new brief, and linen.

## 2021-02-13 NOTE — ED Notes (Signed)
This nurse notified Dr. Roxanne Mins of delay in [place] transfer. This nurse asked EDP if pt needed EMS transport or if she could wait on carelink. EDP stated that we would wait for carelink.

## 2021-02-13 NOTE — Progress Notes (Signed)
Date and time results received: 02/13/21 1728   (use smartphrase ".now" to insert current time)  Test: hemoglobin Critical Value: 6.6 Name of Provider Notified: [place]  Orders Received? Or Actions Taken?: Orders Received - See Orders for details

## 2021-02-13 NOTE — Progress Notes (Signed)
NEW ADMISSION NOTE New Admission Note:   Arrival Method: E.D. stretcher bed Mental Orientation: Alert and oriented x4  Telemetry: # 18 ST Assessment: Indosed. Skin:Healed pressure ulcer at cocyx,assessed with Candice IV: Lt forearm -NSL Pain:Denies Tubes: Lt. Chest Hd catheter. Safety Measures: Safety Fall Prevention Plan has been given, discussed and signed Admission: Completed [facility] Orientation: Patient has been orientated to the room, unit and staff.  Family:  Orders have been reviewed and implemented. Will continue to monitor the patient. Call light has been placed within reach and bed alarm has been activated.   [employer], Zenon Mayo, RN

## 2021-02-13 NOTE — Progress Notes (Signed)
°  [name] Progress Note   Interim History: [name] Adm 11/24 -02/10/21 with acute encephalopathy/seizures secondary to PRES. Also had right arm fistula infection with rupture of pseudoaneurysm on 12/5 and underwent emergent OR repair -brachial to brachial bypass with saphenous vein harvesting. Wound vac placed by vascular. Eliquis and Plavix initially held, but Eliquis was resumed at discharge. Presented to [name] ED this morning with bleeding from surgical wound. Transferred to [name] for vascular evaluation.   Subjective:  Seen and examined in the ED. Biggest complaint is having diarrhea x 2 days. Denies cp, sob. Did have dialysis on Tuesday via [name].   Objective Vitals:   02/13/21 0930 02/13/21 0946 02/13/21 1101 02/13/21 1231  BP: 111/79  121/78 119/76  Pulse: (!) 111 93 96 90  Resp: 17 16 14 14   Temp:   98.5 F (36.9 C) 98.3 F (36.8 C)  TempSrc:   Oral Oral  SpO2: 99% 99% 100% 100%  Weight:      Height:          Additional Objective Labs: Basic Metabolic Panel: Recent Labs  [name] 02/08/21 0151 02/13/21 0647  NA 131* 139  K 5.0 3.7  CL 96* 102  CO2 24 24  GLUCOSE 96 118*  BUN 25* 32*  CREATININE 5.40* 6.29*  CALCIUM 7.5* 7.8*   CBC: Recent Labs  [name] 02/07/21 0126 02/08/21 0151 02/09/21 0224 02/10/21 0709 02/13/21 0647  WBC 7.8 9.4 8.2 9.8 8.7  NEUTROABS  --   --   --   --  7.2  HGB 7.9* 8.8* 9.5* 9.6* 8.0*  HCT 25.3* 28.0* 29.9* 30.2* 26.7*  MCV 94.4 95.9 94.3 94.4 101.1*  PLT 137* 173 189 205 268   Blood Culture    Component Value Date/Time   SDES BLOOD BLOOD LEFT HAND 02/08/2021 1159   SPECREQUEST AEROBIC BOTTLE ONLY Blood Culture adequate volume 02/08/2021 1159   CULT  02/08/2021 1159    NO GROWTH 5 DAYS Performed at [name], [place] [address], [place], [place] [address]    REPTSTATUS 02/13/2021 FINAL 02/08/2021 1159     Physical Exam General: Alert, chronically ill appearing, nad  Heart: RRR No m,r,g  Lungs: Clear  bilaterally  Abdomen: soft non-tender  Extremities: No LE edema; RUE in ACE wrap Dialysis Access: [name]   Medications:    Dialysis Orders:  [name] TTS,EDW 41.5 kg,Bath 2K/2.5 ca,BF 350  DF 500,Time - 180 minutes Access catheter  Medications - out of hectorol and now on calcitriol - 1.25 mcg each tx. Sensipar 90 mg three times a week; Epogen 3600 units each tx, Heparin 1000 units bolus then 500 units per hour Has been on vanc 500 mg each tx (though misses often)  Assessment/Plan: 1. Bleeding from surgical wound. Ruptured pseudoaneurysm s/p right brachial-brachial bypass with ligation of the native brachial artery on 02/03/21.  Per Vascular. Seen by [name] -VVS to follow for wound care  2. ESRD - Usual HD TTS. Missed today, but no urgent dialysis indications. Plan next HD 12/16 off schedule. No heparin   3. HTN/volume- BP/volume stable.  4. Anemia-  Hgb 8.0 on ESA. Follow trends. Transfuse prn  5. MBD - Continue calcitriol/binders. Hypocalcemia -hold Sensipar for now. Follow Ca/Phos trends here  6. Nutrition - Renal diet/fluid restriction   Tina Child PA-C [name] 02/13/2021,3:21 PM

## 2021-02-13 NOTE — Consult Note (Addendum)
POST OPERATIVE NOTE    CC:  F/u for surgery  HPI:  This is a 70 y.o. female who well known to our service.  She has  history of end-stage renal disease previously underwent fistula ligation and ultimately had myriad acellular matrix placed on the right upper extremity wound.  She was seen on 02/03/2021 and when being evaluated, the fistula blew out without any manipulation and pressure was held.  She was taken urgently to the OR and underwent: 1) re-do right brachial exposure and control of hemorrhage 2) harvest right greater saphenous vein 3) right brachial-brachial bypass (end-to-end, reversed greater saphenous vein, subfascial) 4) application of skin substitute to right upper extremity (Myriad 7x10cm) By Dr. Stanford Breed.  OPERATIVE FINDINGS: Complete loss of the anterior wall of the brachial artery at the antecubital fossa.  Hemorrhage control obtained initially with pneumatic tourniquet.  Incision in the mid arm allowed for inflow control.  Transverse incision in the forearm allowed for outflow control of the distal brachial artery.  The right greater saphenous vein was adequate for bypass use and harvested using a continuous incision from groin to mid thigh.  The graft was prepared on the back table and then reversed and sewn end-to-end with proximally distally.  The interposition was ligated to control hemorrhage.  At completion a palpable radial pulse was noted.  To fill the deficit in the antecubital fossa, a plug of 7 x 10 cm myriad matrix was inserted into the brachial arteriotomy.  A patch was applied over this and sutured to the skin using chromic sutures.  A VAC was applied over the right upper extremity incision.  She was last seen by our service on 02/10/2021 prior to discharge back to her facility.  Her right leg vein harvest site was healing well.   [name] was consulted today as the vac was bloody and felt she needed evaluation.  She was transferred from [facility].    Pt states  that she has tenderness along the proximal incision.  Her hand is numb but this has been present since she has been having issues with her left arm.    Allergies  Allergen Reactions   Lactose Intolerance (Gi) Other (See Comments)    On MAR   Latex Rash and Itching   Penicillins Rash and Swelling    Has patient had a PCN reaction causing immediate rash, facial/tongue/throat swelling, SOB or lightheadedness with hypotension: No Has patient had a PCN reaction causing severe rash involving mucus membranes or skin necrosis: No Has patient had a PCN reaction that required hospitalization No Has patient had a PCN reaction occurring within the last 10 years: No If all of the above answers are "NO", then may proceed with Cephalosporin use.      No current facility-administered medications for this encounter.   Current Outpatient Medications  Medication Sig Dispense Refill   Amino Acids-Protein Hydrolys (PRO-STAT 64 PO) Take by mouth. Give 68m by mouth in the morning for wound healing     amLODipine (NORVASC) 10 MG tablet Take 1 tablet (10 mg total) by mouth daily.     apixaban (ELIQUIS) 2.5 MG TABS tablet Take 1 tablet (2.5 mg total) by mouth 2 (two) times daily. 60 tablet 0   baclofen (LIORESAL) 10 MG tablet Take 10 mg by mouth daily as needed for muscle spasms.     calcitRIOL (ROCALTROL) 0.25 MCG capsule Take 5 capsules (1.25 mcg total) by mouth Every Tuesday,Thursday,and Saturday with dialysis.     calcium  acetate (PHOSLO) 667 MG capsule Take 2 capsules (1,334 mg total) by mouth 3 (three) times daily with meals.     cinacalcet (SENSIPAR) 30 MG tablet Take 3 tablets (90 mg total) by mouth Every Tuesday,Thursday,and Saturday with dialysis. 60 tablet    collagenase (SANTYL) ointment Apply 1 application topically daily. Apply to sacrum topically every day shift for wound healing     cyanocobalamin (,VITAMIN B-12,) 1000 MCG/ML injection Inject 1,000 mcg into the muscle every 30 (thirty) days. On  the 28th of every month     dolutegravir (TIVICAY) 50 MG tablet Take 50 mg by mouth daily at 4 PM.     doxycycline (VIBRA-TABS) 100 MG tablet Take 1 tablet (100 mg total) by mouth every 12 (twelve) hours.     Emollient (MINERIN) LOTN Apply 1 application topically every 12 (twelve) hours as needed (dry skin). Apply to feet and ankles     lacosamide 150 MG TABS Take 1 tablet (150 mg total) by mouth 2 (two) times daily. 60 tablet    lamivudine (EPIVIR) 100 MG tablet Take 50 mg by mouth daily. (0800)     levETIRAcetam (KEPPRA) 1000 MG tablet Take 1 tablet (1,000 mg total) by mouth daily.     levETIRAcetam (KEPPRA) 250 MG tablet Take 1 tablet (250 mg total) by mouth every Tuesday, Thursday, and Saturday at 6 PM.     Lidocaine HCl 4 % CREA Apply 1 application topically every 12 (twelve) hours as needed (pain).     lidocaine-prilocaine (EMLA) cream Apply 1 application topically See admin instructions. (0900) Apply to fistula site one hour before dialysis on Tuesday, Thursday, and Saturday.     megestrol (MEGACE) 400 MG/10ML suspension Take 400 mg by mouth daily.     metoprolol tartrate (LOPRESSOR) 25 MG tablet Take 1 tablet (25 mg total) by mouth 2 (two) times daily. 60 tablet 0   mirtazapine (REMERON) 15 MG tablet Take 15 mg by mouth at bedtime.     Multiple Vitamin (MULTIVITAMIN WITH MINERALS) TABS tablet Take 1 tablet by mouth at bedtime. (2000)     nitroGLYCERIN (NITROSTAT) 0.4 MG SL tablet Place 0.4 mg under the tongue every 5 (five) minutes x 3 doses as needed for chest pain.      ondansetron (ZOFRAN ODT) 8 MG disintegrating tablet Take 1 tablet (8 mg total) by mouth every 8 (eight) hours as needed for nausea or vomiting. 30 tablet 3   oxyCODONE-acetaminophen (PERCOCET/ROXICET) 5-325 MG tablet Take 1-2 tablets by mouth every 6 (six) hours as needed for moderate pain. 30 tablet 0   pantoprazole (PROTONIX) 40 MG tablet Take 1 tablet (40 mg total) by mouth daily before breakfast. 30 tablet 3    polyethylene glycol (MIRALAX / GLYCOLAX) 17 g packet Take 17 g by mouth daily as needed for moderate constipation. 14 each 0   senna (SENOKOT) 8.6 MG tablet Take 2 tablets by mouth every evening. (2100)     thiamine 100 MG tablet Take 1 tablet (100 mg total) by mouth daily.     venlafaxine (EFFEXOR) 75 MG tablet Take 1 tablet (75 mg total) by mouth 2 (two) times daily.     vitamin C (ASCORBIC ACID) 500 MG tablet Take 500 mg by mouth 2 (two) times daily.     zidovudine (RETROVIR) 100 MG capsule Take 100 mg by mouth 3 (three) times daily. (0900, 1300, & 2100)     Facility-Administered Medications Ordered in Other Encounters  Medication Dose Route Frequency Provider Last Rate Last  Admin   acetaminophen (TYLENOL) tablet 650 mg  650 mg Oral Once Lockamy, Randi L, NP-C       diphenhydrAMINE (BENADRYL) capsule 25 mg  25 mg Oral Once Lockamy, Randi L, NP-C       sodium chloride flush (NS) 0.9 % injection 10 mL  10 mL Intracatheter PRN Lockamy, Randi L, NP-C         ROS:  See HPI  Physical Exam:   Incision:     Extremities:  palpable right radial pulse    Assessment/Plan:  This is a 70 y.o. female who is s/p: 1) re-do right brachial exposure and control of hemorrhage 2) harvest right greater saphenous vein 3) right brachial-brachial bypass (end-to-end, reversed greater saphenous vein, subfascial) 4) application of skin substitute to right upper extremity (Myriad 7x10cm)   -pt's incisions look good.  There is a mild bloody ooze from the proximal portion of the proximal incision at the staple line.  The area where the myriad was placed looks good.   -recommend holding her eliquis at this time. -wet to dry saline dressing was placed with mild compression as she is tender at the proximal area of her arm.  -she will be admitted to [facility] and we will follow for wound care.    Leontine Locket, [facility] [phone]  I have seen and evaluated the patient. I agree with  the PA note as documented above.  70 year old female that I was contacted this morning from [facility] ED given concern for bleeding under the [facility] on her right upper extremity.  She recently underwent right brachial artery to brachial artery bypass with ligation of the native brachial artery where she had bleeding from a ruptured pseudoaneurysm and uncontrolled hemorrhage at a previous AV fistula site.  On exam she has an easily palpable radial pulse.  The VAC had fairly significant amount of hematoma underneath it and this was removed at bedside in the ED.  There is no appreciable hematoma that needs to be evacuated surgically.  We will discontinue VAC for the time being and place in wet-to-dry dressing which we did a bedside with gentle Ace wrap for compression.  She has some oozing from the proximal staple line of her bypass and I suspect this is related to her anticoagulation on Eliquis and I would hold this until it dries up.  Vascular will follow.  The open wounds have been grafted with skin substitute.  Marty Heck, MD [facility] [place] Office: [phone]

## 2021-02-14 ENCOUNTER — Encounter (HOSPITAL_COMMUNITY): Payer: Self-pay | Admitting: Internal Medicine

## 2021-02-14 DIAGNOSIS — F32A Depression, unspecified: Secondary | ICD-10-CM | POA: Diagnosis present

## 2021-02-14 DIAGNOSIS — G4700 Insomnia, unspecified: Secondary | ICD-10-CM | POA: Diagnosis present

## 2021-02-14 DIAGNOSIS — D62 Acute posthemorrhagic anemia: Secondary | ICD-10-CM | POA: Diagnosis present

## 2021-02-14 DIAGNOSIS — G8929 Other chronic pain: Secondary | ICD-10-CM | POA: Diagnosis present

## 2021-02-14 DIAGNOSIS — Z681 Body mass index (BMI) 19 or less, adult: Secondary | ICD-10-CM | POA: Diagnosis not present

## 2021-02-14 DIAGNOSIS — Y841 Kidney dialysis as the cause of abnormal reaction of the patient, or of later complication, without mention of misadventure at the time of the procedure: Secondary | ICD-10-CM | POA: Diagnosis present

## 2021-02-14 DIAGNOSIS — D689 Coagulation defect, unspecified: Secondary | ICD-10-CM | POA: Diagnosis present

## 2021-02-14 DIAGNOSIS — I959 Hypotension, unspecified: Secondary | ICD-10-CM | POA: Diagnosis not present

## 2021-02-14 DIAGNOSIS — K219 Gastro-esophageal reflux disease without esophagitis: Secondary | ICD-10-CM | POA: Diagnosis present

## 2021-02-14 DIAGNOSIS — Z7901 Long term (current) use of anticoagulants: Secondary | ICD-10-CM | POA: Diagnosis not present

## 2021-02-14 DIAGNOSIS — E739 Lactose intolerance, unspecified: Secondary | ICD-10-CM | POA: Diagnosis present

## 2021-02-14 DIAGNOSIS — I10 Essential (primary) hypertension: Secondary | ICD-10-CM | POA: Diagnosis not present

## 2021-02-14 DIAGNOSIS — D638 Anemia in other chronic diseases classified elsewhere: Secondary | ICD-10-CM | POA: Diagnosis present

## 2021-02-14 DIAGNOSIS — I120 Hypertensive chronic kidney disease with stage 5 chronic kidney disease or end stage renal disease: Secondary | ICD-10-CM | POA: Diagnosis present

## 2021-02-14 DIAGNOSIS — T148XXA Other injury of unspecified body region, initial encounter: Secondary | ICD-10-CM | POA: Diagnosis not present

## 2021-02-14 DIAGNOSIS — Z992 Dependence on renal dialysis: Secondary | ICD-10-CM | POA: Diagnosis not present

## 2021-02-14 DIAGNOSIS — I4892 Unspecified atrial flutter: Secondary | ICD-10-CM | POA: Diagnosis present

## 2021-02-14 DIAGNOSIS — Z66 Do not resuscitate: Secondary | ICD-10-CM | POA: Diagnosis present

## 2021-02-14 DIAGNOSIS — B20 Human immunodeficiency virus [HIV] disease: Secondary | ICD-10-CM | POA: Diagnosis present

## 2021-02-14 DIAGNOSIS — D500 Iron deficiency anemia secondary to blood loss (chronic): Secondary | ICD-10-CM | POA: Diagnosis present

## 2021-02-14 DIAGNOSIS — E43 Unspecified severe protein-calorie malnutrition: Secondary | ICD-10-CM | POA: Diagnosis present

## 2021-02-14 DIAGNOSIS — Z8673 Personal history of transient ischemic attack (TIA), and cerebral infarction without residual deficits: Secondary | ICD-10-CM | POA: Diagnosis not present

## 2021-02-14 DIAGNOSIS — Z20822 Contact with and (suspected) exposure to covid-19: Secondary | ICD-10-CM | POA: Diagnosis present

## 2021-02-14 DIAGNOSIS — T8142XA Infection following a procedure, deep incisional surgical site, initial encounter: Secondary | ICD-10-CM | POA: Diagnosis not present

## 2021-02-14 DIAGNOSIS — T82838A Hemorrhage of vascular prosthetic devices, implants and grafts, initial encounter: Secondary | ICD-10-CM | POA: Diagnosis present

## 2021-02-14 DIAGNOSIS — I4891 Unspecified atrial fibrillation: Secondary | ICD-10-CM | POA: Diagnosis present

## 2021-02-14 DIAGNOSIS — N186 End stage renal disease: Secondary | ICD-10-CM | POA: Diagnosis present

## 2021-02-14 DIAGNOSIS — F419 Anxiety disorder, unspecified: Secondary | ICD-10-CM | POA: Diagnosis present

## 2021-02-14 LAB — TYPE AND SCREEN
ABO/RH(D): O POS
Antibody Screen: NEGATIVE
Unit division: 0

## 2021-02-14 LAB — RENAL FUNCTION PANEL
Albumin: 2.1 g/dL — ABNORMAL LOW (ref 3.5–5.0)
Anion gap: 11 (ref 5–15)
BUN: 36 mg/dL — ABNORMAL HIGH (ref 8–23)
CO2: 24 mmol/L (ref 22–32)
Calcium: 8.1 mg/dL — ABNORMAL LOW (ref 8.9–10.3)
Chloride: 102 mmol/L (ref 98–111)
Creatinine, Ser: 7.38 mg/dL — ABNORMAL HIGH (ref 0.44–1.00)
GFR, Estimated: 6 mL/min — ABNORMAL LOW (ref >60)
Glucose, Bld: 91 mg/dL (ref 70–99)
Phosphorus: 4.7 mg/dL — ABNORMAL HIGH (ref 2.5–4.6)
Potassium: 4.2 mmol/L (ref 3.5–5.1)
Sodium: 137 mmol/L (ref 135–145)

## 2021-02-14 LAB — C DIFFICILE QUICK SCREEN W PCR REFLEX
C Diff antigen: POSITIVE — AB
C Diff toxin: NEGATIVE

## 2021-02-14 LAB — BPAM RBC
Blood Product Expiration Date: 202301112359
ISSUE DATE / TIME: 202212152018
Unit Type and Rh: 5100

## 2021-02-14 LAB — HEPATITIS B SURFACE ANTIBODY,QUALITATIVE: Hep B S Ab: REACTIVE — AB

## 2021-02-14 LAB — HEMOGLOBIN AND HEMATOCRIT, BLOOD
HCT: 26.6 % — ABNORMAL LOW (ref 36.0–46.0)
Hemoglobin: 8.7 g/dL — ABNORMAL LOW (ref 12.0–15.0)

## 2021-02-14 LAB — CLOSTRIDIUM DIFFICILE BY PCR, REFLEXED: Toxigenic C. Difficile by PCR: NEGATIVE

## 2021-02-14 LAB — HEPATITIS B SURFACE ANTIGEN: Hepatitis B Surface Ag: NONREACTIVE

## 2021-02-14 MED ORDER — SODIUM CHLORIDE 0.9 % IV SOLN: 100.0000 mL | INTRAVENOUS | Status: DC | PRN

## 2021-02-14 MED ORDER — LIDOCAINE-PRILOCAINE 2.5-2.5 % EX CREA: 1.0000 "application " | TOPICAL_CREAM | CUTANEOUS | Status: DC | PRN

## 2021-02-14 MED ORDER — TRAZODONE HCL 50 MG PO TABS: 50.0000 mg | ORAL_TABLET | Freq: Once | ORAL | Status: DC

## 2021-02-14 MED ORDER — ALTEPLASE 2 MG IJ SOLR: 2.0000 mg | Freq: Once | INTRAMUSCULAR | Status: DC | PRN

## 2021-02-14 MED ORDER — HEPARIN SODIUM (PORCINE) 1000 UNIT/ML DIALYSIS
1000.0000 [IU] | INTRAMUSCULAR | Status: DC | PRN
  Filled 2021-02-14: qty 1

## 2021-02-14 MED ORDER — METOCLOPRAMIDE HCL 5 MG/ML IJ SOLN
10.0000 mg | Freq: Four times a day (QID) | INTRAMUSCULAR | Status: DC | PRN
  Administered 2021-02-14 – 2021-02-20 (×3): 10 mg via INTRAVENOUS
  Filled 2021-02-14 (×3): qty 2

## 2021-02-14 MED ORDER — PENTAFLUOROPROP-TETRAFLUOROETH EX AERO: 1.0000 "application " | INHALATION_SPRAY | CUTANEOUS | Status: DC | PRN

## 2021-02-14 MED ORDER — ONDANSETRON HCL 4 MG PO TABS
4.0000 mg | ORAL_TABLET | Freq: Four times a day (QID) | ORAL | Status: DC
  Administered 2021-02-15 – 2021-02-21 (×20): 4 mg via ORAL
  Filled 2021-02-14 (×22): qty 1

## 2021-02-14 MED ORDER — LIDOCAINE HCL (PF) 1 % IJ SOLN: 5.0000 mL | INTRAMUSCULAR | Status: DC | PRN

## 2021-02-14 MED ORDER — HEPARIN SODIUM (PORCINE) 1000 UNIT/ML DIALYSIS
1000.0000 [IU] | INTRAMUSCULAR | Status: DC | PRN
  Administered 2021-02-15: 1000 [IU] via INTRAVENOUS_CENTRAL
  Filled 2021-02-14: qty 1

## 2021-02-14 NOTE — Progress Notes (Signed)
[facility] Progress Note   Interim History: [facility] Adm 11/24 -02/10/21 with acute encephalopathy/seizures secondary to PRES. Also had right arm fistula infection with rupture of pseudoaneurysm on 12/5 and underwent emergent OR repair -brachial to brachial bypass with saphenous vein harvesting. Wound vac placed by vascular. Eliquis and Plavix initially held, but Eliquis was resumed at discharge. Presented to [facility] ED this morning with bleeding from surgical wound. Transferred to [facility] for vascular evaluation.   Subjective:  Seen in room. Had transfusion last night. Hgb 8.7. Sleepy this am, wants to rest. For dialysis today.   Objective Vitals:   02/13/21 2303 02/14/21 0439 02/14/21 1011 02/14/21 1105  BP: 116/72 132/71 (!) 152/81 129/74  Pulse: 62 60 80 77  Resp: 16 16 16 15   Temp: 98.4 F (36.9 C) 98.6 F (37 C) 98.1 F (36.7 C) 98.6 F (37 C)  TempSrc: Oral Oral Oral Oral  SpO2: 98% 98% 100%   Weight:    37.3 kg  Height:          Additional Objective Labs: Basic Metabolic Panel: Recent Labs  Lab 02/08/21 0151 02/13/21 0647 02/14/21 0055  NA 131* 139 137  K 5.0 3.7 4.2  CL 96* 102 102  CO2 24 24 24   GLUCOSE 96 118* 91  BUN 25* 32* 36*  CREATININE 5.40* 6.29* 7.38*  CALCIUM 7.5* 7.8* 8.1*  PHOS  --   --  4.7*    CBC: Recent Labs  Lab 02/08/21 0151 02/09/21 0224 02/10/21 0709 02/13/21 0647 02/13/21 1609 02/14/21 0055  WBC 9.4 8.2 9.8 8.7  --   --   NEUTROABS  --   --   --  7.2  --   --   HGB 8.8* 9.5* 9.6* 8.0* 6.6* 8.7*  HCT 28.0* 29.9* 30.2* 26.7* 22.5* 26.6*  MCV 95.9 94.3 94.4 101.1*  --   --   PLT 173 189 205 268  --   --     Blood Culture    Component Value Date/Time   SDES BLOOD BLOOD LEFT HAND 02/08/2021 1159   SPECREQUEST AEROBIC BOTTLE ONLY Blood Culture adequate volume 02/08/2021 1159   CULT  02/08/2021 1159    NO GROWTH 5 DAYS Performed at [facility], [place] [address], [place], [place] [address]    REPTSTATUS  02/13/2021 FINAL 02/08/2021 1159     Physical Exam General: Alert, chronically ill appearing, nad  Heart: RRR No m,r,g  Lungs: Clear bilaterally  Abdomen: soft non-tender  Extremities: No LE edema; RUE in ACE wrap Dialysis Access: [name]   Medications:    Dialysis Orders:  [name] TTS,EDW 41.5 kg,Bath 2K/2.5 ca,BF 350  DF 500,Time - 180 minutes Access catheter  Medications - out of hectorol and now on calcitriol - 1.25 mcg each tx. Sensipar 90 mg three times a week; Epogen 3600 units each tx, Heparin 1000 units bolus then 500 units per hour Has been on vanc 500 mg each tx (though misses often)  Assessment/Plan: 1. Bleeding from surgical wound. Ruptured pseudoaneurysm s/p right brachial-brachial bypass with ligation of the native brachial artery on 02/03/21.  Per Vascular. Seen by [name] -VVS to follow for wound care  2. ESRD - Usual HD TTS. Missed Thursday. HD today off schedule. Back on schedule tomorrow. . No heparin   3. HTN/volume- BP/volume stable.  4. Anemia-  Hgb 8.7  Transfused 1 unit prbcs on 12/16.  on ESA. Follow trends. 5. MBD - Continue calcitriol/binders. Hold Sensipar  for now. Follow Ca trends. Ca/Phos ok.  6. Nutrition - Renal diet/fluid restriction   Lynnda Child PA-C [facility] 02/14/2021,11:23 AM

## 2021-02-14 NOTE — Progress Notes (Signed)
Uf turned back on.

## 2021-02-14 NOTE — TOC Progression Note (Signed)
Transition of Care [facility]) - Initial/Assessment Note    Patient Details  Name: [name] MRN: [id number] Date of Birth: 02-27-1951  [facility] CM/SW Contact:    Milinda Antis, [place] Phone Number: 02/14/2021, 4:21 PM  Clinical Narrative:                 CSW contacted admissions at [facility] and verified that the patient is LTC at the facility and can return when medically ready.    Expected Discharge Plan: Long Term Nursing Home Barriers to Discharge: Continued Medical Work up   Patient Goals and CMS Choice Patient states their goals for this hospitalization and ongoing recovery are:: She is LTC at [facility]      Expected Discharge Plan and Services Expected Discharge Plan: [place]       Living arrangements for the past 2 months: [place]                                      Prior Living Arrangements/Services Living arrangements for the past 2 months: [place] Lives with:: Facility Resident Patient language and need for interpreter reviewed:: Yes Do you feel safe going back to the place where you live?: Yes      Need for Family Participation in Patient Care: Yes (Comment) Care giver support system in place?: Yes (comment)   Criminal Activity/Legal Involvement Pertinent to Current Situation/Hospitalization: No - Comment as needed  Activities of Daily Living Home Assistive Devices/Equipment: Wheelchair ADL Screening (condition at time of admission) Patient's cognitive ability adequate to safely complete daily activities?: No Is the patient deaf or have difficulty hearing?: Yes Does the patient have difficulty seeing, even when wearing glasses/contacts?: No Does the patient have difficulty concentrating, remembering, or making decisions?: No Patient able to express need for assistance with ADLs?: Yes Does the patient have difficulty dressing or bathing?: Yes Independently performs  ADLs?: No Communication: Independent Dressing (OT): Needs assistance Is this a change from baseline?: Pre-admission baseline Grooming: Needs assistance Is this a change from baseline?: Pre-admission baseline Feeding: Independent Bathing: Needs assistance Is this a change from baseline?: Pre-admission baseline Toileting: Needs assistance Is this a change from baseline?: Pre-admission baseline In/Out Bed: Needs assistance Is this a change from baseline?: Pre-admission baseline Walks in Home: Needs assistance Is this a change from baseline?: Pre-admission baseline Does the patient have difficulty walking or climbing stairs?: Yes Weakness of Legs: Both Weakness of Arms/Hands: Both  Permission Sought/Granted Permission sought to share information with : Chartered certified accountant granted to share information with : Yes, Verbal Permission Granted              Emotional Assessment       Orientation: : Oriented to Situation, Oriented to  Time, Oriented to Place, Oriented to Self Alcohol / Substance Use: Not Applicable Psych Involvement: No (comment)  Admission diagnosis:  Anemia, blood loss [D50.0] End-stage renal disease on hemodialysis ([place]) [N18.6, Z99.2] Bleeding from wound [T14.8XXA] Acute blood loss anemia [D62] Patient Active Problem List   Diagnosis Date Noted   Bleeding from wound 02/13/2021   Acute blood loss anemia 02/13/2021   Thrombocytopenia ([place]) 01/30/2021   Hyponatremia 01/30/2021   Acute delirium    Hyperthyroidism 01/25/2021   Seizure ([place]) 01/25/2021   MRSA infection 01/25/2021   PRES (posterior reversible encephalopathy syndrome)    Hypertensive emergency  Hypoglycemia 12/25/2020   Infected fluid collection with fistula 12/24/2020   Dysphagia 12/16/2020   Loss of weight 12/16/2020   Abdominal pain, epigastric 12/16/2020   Protein-calorie malnutrition, severe 11/20/2020   Chest pain of uncertain etiology    Altered mental status     ESRD (end stage renal disease) ([place]) 11/11/2020   Pancreatic abnormality 08/05/2020   Gastritis without bleeding 08/05/2020   Dilated pancreatic duct 05/15/2020   Abnormal MRI of abdomen 05/15/2020   Common bile duct dilation    Lobar pneumonia ([place]) 07/20/2019   Acute respiratory failure with hypoxia ([place]) 07/20/2019   Colitis 07/19/2019   Dilation of common bile duct 07/19/2019   Ventral hernia without obstruction or gangrene    Hypocalcemia 03/07/2019   Other pancytopenia ([place]) 12/08/2018   Macrocytic anemia 04/17/2016   Hepatitis B immune 01/28/2016   Atrial flutter ([place]) 02/22/2015   Hyperkalemia 02/20/2015   Venous stenosis of right upper extremity 04/06/2014   ESRD on dialysis ([place]) 04/06/2014   Essential hypertension    Nausea, vomiting, and diarrhea 12/15/2012   CHF, chronic ([place]) 11/25/2012   Coronary artery disease 09/15/2012   History of stroke 09/15/2012   Anxiety 09/15/2012   Atrial fibrillation ([place]) 05/02/2012   Goiter 04/08/2012   Intracranial bleed ([place]) 12/28/2011   Angioedema of lips 09/21/2011   Cellulitis of breast 03/09/2011   Erosive esophagitis 12/15/2010   Mallory - Weiss tear 12/15/2010   UGI bleed 12/13/2010   DIARRHEA 04/12/2009   Human immunodeficiency virus (HIV) disease ([place]) 11/15/2008   PANCREATITIS, HX OF 11/15/2008   TOBACCO USE, QUIT 11/15/2008   PAIN IN JOINT, UPPER ARM 08/07/2008   ABDOMINAL WALL HERNIA 04/26/2007   BRONCHITIS, ACUTE 11/16/2006   HYPOKALEMIA, HX OF 07/13/2006   ANEMIA, NORMOCYTIC, CHRONIC 03/23/2006   DEPRESSION 03/23/2006   Gastroesophageal reflux disease 03/23/2006   PANCREATITIS 03/23/2006   MASS, RIGHT AXILLA 03/23/2006   HEADACHE 03/23/2006   SYMPTOM, ENLARGEMENT, LYMPH NODES 03/23/2006   SHINGLES, HX OF 03/23/2006   HYSTERECTOMY, TOTAL, HX OF 03/23/2006   PCP:  Hilbert Corrigan, MD Pharmacy:   [facility] [place] [address] [place]  [address] Phone: [phone] Fax: [phone]     Social Determinants of Health (SDOH) Interventions    Readmission Risk Interventions Readmission Risk Prevention Plan 10/22/2019  Transportation Screening Complete  PCP or Specialist Appt within 3-5 Days Not Complete  Not Complete comments SNF MD to follow  [place] or [place] Not Complete  HRI or Home Care Consult comments Pt resides in a SNF  Social Work Consult for [place] Planning/Counseling Complete  Palliative Care Screening Not Applicable  Medication Review Press photographer) Complete  Some recent data might be hidden

## 2021-02-14 NOTE — Progress Notes (Addendum)
Progress Note    02/14/2021 7:36 AM   Subjective:  says her right arm pain is better this morning.  Says her dressing was just changed.  afebrile  Vitals:   02/13/21 2303 02/14/21 0439  BP: 116/72 132/71  Pulse: 62 60  Resp: 16 16  Temp: 98.4 F (36.9 C) 98.6 F (37 C)  SpO2: 98% 98%    Physical Exam: General:  resting comfortably Lungs:  non labored Incisions:  dressing to right arm is clean and dry   CBC    Component Value Date/Time   WBC 8.7 02/13/2021 0647   RBC 2.64 (L) 02/13/2021 0647   HGB 8.7 (L) 02/14/2021 0055   HCT 26.6 (L) 02/14/2021 0055   PLT 268 02/13/2021 0647   MCV 101.1 (H) 02/13/2021 0647   MCH 30.3 02/13/2021 0647   MCHC 30.0 02/13/2021 0647   RDW 20.6 (H) 02/13/2021 0647   LYMPHSABS 0.8 02/13/2021 0647   MONOABS 0.5 02/13/2021 0647   EOSABS 0.0 02/13/2021 0647   BASOSABS 0.0 02/13/2021 0647    BMET    Component Value Date/Time   NA 137 02/14/2021 0055   K 4.2 02/14/2021 0055   CL 102 02/14/2021 0055   CO2 24 02/14/2021 0055   GLUCOSE 91 02/14/2021 0055   BUN 36 (H) 02/14/2021 0055   CREATININE 7.38 (H) 02/14/2021 0055   CREATININE 5.66 (H) 12/22/2019 1117   CALCIUM 8.1 (L) 02/14/2021 0055   CALCIUM 9.0 03/12/2011 0527   GFRNONAA 6 (L) 02/14/2021 0055   GFRNONAA 5 (L) 02/12/2015 1000   GFRAA 9 (L) 10/22/2019 0105   GFRAA 5 (L) 02/12/2015 1000    INR    Component Value Date/Time   INR 1.2 01/24/2021 1054     Intake/Output Summary (Last 24 hours) at 02/14/2021 0736 Last data filed at 02/14/2021 0500 Gross per 24 hour  Intake 657.83 ml  Output --  Net 657.83 ml     Assessment/Plan:  70 y.o. female is s/p:  1) re-do right brachial exposure and control of hemorrhage 2) harvest right greater saphenous vein 3) right brachial-brachial bypass (end-to-end, reversed greater saphenous vein, subfascial) 4) application of skin substitute to right upper extremity ([facility] 7x10cm) By Dr. Stanford Breed.     -pt resting  comfortably.  Dressing just changed so I did not remove this.  She states her pain in the right arm has improved. -she did have acute blood loss anemia with hgb of 6.6.  this has improved to 8.7 with transfusion.   -DVT prophylaxis:  Eliquis is currently on hold.    Leontine Locket, PA-C [facility] [phone] 02/14/2021 7:36 AM  I have seen and evaluated the patient. I agree with the PA note as documented above.  Transferred from [facility] yesterday with concern for bleeding from her right upper extremity wound after recent brachial artery bypass and ligation of native brachial artery where she bled from ruptured pseudoaneurysm.  I changed her dressing this morning with wet-to-dry dressing and the wound looks much better since her anticoagulation has been held with no active ongoing oozing.  There is no surgical hematoma that needs evacuation.  Would continue wet-to-dry dressings.  Palpable radial pulse at the wrist.  Vascular will follow.  Hgb 8.7 after transfusion.  Marty Heck, MD [facility] [place] Office: [phone]

## 2021-02-14 NOTE — Progress Notes (Signed)
Patient with hypotension and tachycardia during hemodialysis. Treatment terminated due to severe hypotension and tachycardia. 51 assistant Ejigiri notified. Bp improved following rinse back. Bp 119/71 prio to patient leaving HD treatment room.

## 2021-02-14 NOTE — Progress Notes (Addendum)
HOSPITAL MEDICINE OVERNIGHT EVENT NOTE    Notified by nursing that patient has completed her first unit of packed red blood cell transfusion.  Bleeding from fistula seems to have stopped.   Admitting provider mentions in their H&P the patient was to have 2 units transfused however blood bank states only order for 1 unit was received.  Last hemoglobin was 6.6.  Will obtain repeat hemoglobin and hematocrit and then determine whether order for second unit is needed.  Tina Emerald  MD [facility]   ADDENDUM (12/16 1:30AM)  Hemoglobin 8.7, will hold off on order for 2nd unit of PRBC.  [name] Art Levan

## 2021-02-14 NOTE — Progress Notes (Signed)
Treatment terminated due to low bp and heart rate.

## 2021-02-14 NOTE — Progress Notes (Signed)
Uf turned off.

## 2021-02-14 NOTE — Progress Notes (Signed)
Patient ID: [name], female   DOB: 19-Aug-1950, 70 y.o.   MRN: [id number]  PROGRESS NOTE    [name]  KKX:[id number] DOB: [birth year] DOA: 02/13/2021 PCP: Hilbert Corrigan, MD   Brief Narrative:  70 y.o. female with medical history significant of ESRD on HD, atrial flutter on Eliquis, pulmonary hypertension, HIV, and GERD, recent fistula infection on vancomycin treatment with HD as an outpatient, recent admission from 01/23/2021-12/12 /2022 for syncopal episode while undergoing hemodialysis; subsequently patient was noted to be hypertensive and had seizure for which MRI revealed concern for PRES requiring Cardene drip and transferred to ICU for continuous EEG monitoring.  During hospitalization patient was noted to have a pseudoaneurysm rupture of the right arm fistula taken to the OR emergently and underwent redo of the right brachial exposure and control of hemorrhage, harvest right greater saphenous vein, right brachial brachial bypass, and application of skin substitute to the right upper extremity.  Eliquis and Plavix were initially held and she required transfusion of 2 units of packed red blood cells on 12/6.  Hemoglobin had remained stable and Eliquis only was resumed on 12/7. She presented back to the ED with bleeding from fistula; on presentation in the ED, hemoglobin was 8 with subsequent hemoglobin of 6.6.  Vascular surgery and nephrology were consulted.  Assessment & Plan:   Bleeding around AV fistula wound -Patient with recent history of ruptured pseudoaneurysm status post brachial brachial bypass with ligation of the need of brachial artery on 12/5.  Vascular surgery consulted discontinued VAC, put in orders for wound care, and recommended holding anticoagulation in the acute setting. -Follow vascular surgery recommendations: Dressing changes as per vascular surgery.  Anticoagulation to remain on hold till cleared by vascular surgery.  Acute blood loss  anemia/anemia of chronic disease from renal failure -Hemoglobin 6.6 on presentation.  Status post 1 unit packed red cell transfusion hemoglobin 8 this morning.  Monitor H&H.  End-stage renal disease on hemodialysis -Nephrology following.  Dialysis as per nephrology schedule  Atrial flutter on chronic anticoagulation -Continue metoprolol.  Intermittently bradycardic mildly.  Eliquis on hold  Nausea, vomiting and diarrhea -Patient was on scheduled Senokot at the rehab.  Senokot on hold.  Monitor.  Advance diet as tolerated.  Use antiemetics as needed  HIV  -continue current antiretroviral therapy  Essential hypertension -Blood pressure on the lower side.  Continue amlodipine and metoprolol  GERD -Continue PPI  Generalized deconditioning -PT eval   DVT prophylaxis: SCDs Code Status: DNR Family Communication: None at bedside Disposition Plan: Status is: Observation  The patient will require care spanning > 2 midnights and should be moved to inpatient because: Of need for continued wound care and further vascular surgery recommendations  Consultants: Vascular surgery/nephrology  Procedures: None  Antimicrobials: None   Subjective: Patient seen and examined at bedside.  Denies worsening fever, nausea, vomiting.  Objective: Vitals:   02/13/21 2041 02/13/21 2043 02/13/21 2303 02/14/21 0439  BP: 107/66  116/72 132/71  Pulse: (!) 43 89 62 60  Resp: 16  16 16   Temp: 98.4 F (36.9 C)  98.4 F (36.9 C) 98.6 F (37 C)  TempSrc: Oral  Oral Oral  SpO2: 100% 98% 98% 98%  Weight:      Height:        Intake/Output Summary (Last 24 hours) at 02/14/2021 0729 Last data filed at 02/14/2021 0500 Gross per 24 hour  Intake 657.83 ml  Output --  Net 657.83 ml   [employer]  02/13/21 0531  Weight: 38 kg    Examination:  General exam: Appears calm and comfortable.  Looks chronically ill and deconditioned.  On room air. Respiratory system: Bilateral decreased breath  sounds at bases with some scattered crackles Cardiovascular system: S1 & S2 heard, Rate controlled Gastrointestinal system: Abdomen is nondistended, soft and nontender. Normal bowel sounds heard. Extremities: No cyanosis, clubbing; trace lower extremity edema Central nervous system: Awake, extremely slow to respond, very poor historian; no focal neurological deficits. Moving extremities Skin: Right upper extremity dressing present.  No obvious petechiae noted  psychiatry: Affect is very flat.  Does not participate in conversation much   Data Reviewed: I have personally reviewed following labs and imaging studies  CBC: Recent Labs  Lab 02/08/21 0151 02/09/21 0224 02/10/21 0709 02/13/21 0647 02/13/21 1609 02/14/21 0055  WBC 9.4 8.2 9.8 8.7  --   --   NEUTROABS  --   --   --  7.2  --   --   HGB 8.8* 9.5* 9.6* 8.0* 6.6* 8.7*  HCT 28.0* 29.9* 30.2* 26.7* 22.5* 26.6*  MCV 95.9 94.3 94.4 101.1*  --   --   PLT 173 189 205 268  --   --    Basic Metabolic Panel: Recent Labs  Lab 02/08/21 0151 02/13/21 0647 02/14/21 0055  NA 131* 139 137  K 5.0 3.7 4.2  CL 96* 102 102  CO2 24 24 24   GLUCOSE 96 118* 91  BUN 25* 32* 36*  CREATININE 5.40* 6.29* 7.38*  CALCIUM 7.5* 7.8* 8.1*  PHOS  --   --  4.7*   GFR: Estimated Creatinine Clearance: 4.4 mL/min (A) (by C-G formula based on SCr of 7.38 mg/dL (H)). Liver Function Tests: Recent Labs  Lab 02/14/21 0055  ALBUMIN 2.1*   No results for input(s): LIPASE, AMYLASE in the last 168 hours. No results for input(s): AMMONIA in the last 168 hours. Coagulation Profile: No results for input(s): INR, PROTIME in the last 168 hours. Cardiac Enzymes: No results for input(s): CKTOTAL, CKMB, CKMBINDEX, TROPONINI in the last 168 hours. BNP (last 3 results) No results for input(s): PROBNP in the last 8760 hours. HbA1C: No results for input(s): HGBA1C in the last 72 hours. CBG: Recent Labs  Lab 02/09/21 2349 02/10/21 0408 02/10/21 0822  02/10/21 1235 02/10/21 2048  GLUCAP 127* 85 68* 83 89   Lipid Profile: No results for input(s): CHOL, HDL, LDLCALC, TRIG, CHOLHDL, LDLDIRECT in the last 72 hours. Thyroid Function Tests: No results for input(s): TSH, T4TOTAL, FREET4, T3FREE, THYROIDAB in the last 72 hours. Anemia Panel: No results for input(s): VITAMINB12, FOLATE, FERRITIN, TIBC, IRON, RETICCTPCT in the last 72 hours. Sepsis Labs: No results for input(s): PROCALCITON, LATICACIDVEN in the last 168 hours.  Recent Results (from the past 240 hour(s))  Culture, blood (Routine X 2) w Reflex to ID Panel     Status: None   Collection Time: 02/08/21 11:34 AM   Specimen: BLOOD  Result Value Ref Range Status   Specimen Description BLOOD BLOOD LEFT HAND  Final   Special Requests   Final    BOTTLES DRAWN AEROBIC AND ANAEROBIC Blood Culture results may not be optimal due to an inadequate volume of blood received in culture bottles   Culture   Final    NO GROWTH 5 DAYS Performed at [facility], [place] [address] [place], [place] [address]    Report Status 02/13/2021 FINAL  Final  Culture, blood (Routine X 2) w Reflex to ID  Panel     Status: None   Collection Time: 02/08/21 11:59 AM   Specimen: BLOOD  Result Value Ref Range Status   Specimen Description BLOOD BLOOD LEFT HAND  Final   Special Requests AEROBIC BOTTLE ONLY Blood Culture adequate volume  Final   Culture   Final    NO GROWTH 5 DAYS Performed at [facility], [address], [place], [place] [address]    Report Status 02/13/2021 FINAL  Final  Resp Panel by RT-PCR (Flu A&B, Covid) Nasopharyngeal Swab     Status: None   Collection Time: 02/10/21 12:57 PM   Specimen: Nasopharyngeal Swab; Nasopharyngeal(NP) swabs in vial transport medium  Result Value Ref Range Status   SARS Coronavirus 2 by RT PCR NEGATIVE NEGATIVE Final    Comment: (NOTE) SARS-CoV-2 target nucleic acids are NOT DETECTED.  The SARS-CoV-2 RNA is generally detectable in upper  respiratory specimens during the acute phase of infection. The lowest concentration of SARS-CoV-2 viral copies this assay can detect is 138 copies/mL. A negative result does not preclude SARS-Cov-2 infection and should not be used as the sole basis for treatment or other patient management decisions. A negative result may occur with  improper specimen collection/handling, submission of specimen other than nasopharyngeal swab, presence of viral mutation(s) within the areas targeted by this assay, and inadequate number of viral copies(<138 copies/mL). A negative result must be combined with clinical observations, patient history, and epidemiological information. The expected result is Negative.  Fact Sheet for Patients:  EntrepreneurPulse.com.au  Fact Sheet for Healthcare Providers:  IncredibleEmployment.be  This test is no t yet approved or cleared by the [place] FDA and  has been authorized for detection and/or diagnosis of SARS-CoV-2 by FDA under an Emergency Use Authorization (EUA). This EUA will remain  in effect (meaning this test can be used) for the duration of the COVID-19 declaration under Section 564(b)(1) of the Act, 21 U.S.C.section 360bbb-3(b)(1), unless the authorization is terminated  or revoked sooner.       Influenza A by PCR NEGATIVE NEGATIVE Final   Influenza B by PCR NEGATIVE NEGATIVE Final    Comment: (NOTE) The Xpert Xpress SARS-CoV-2/FLU/RSV plus assay is intended as an aid in the diagnosis of influenza from Nasopharyngeal swab specimens and should not be used as a sole basis for treatment. Nasal washings and aspirates are unacceptable for Xpert Xpress SARS-CoV-2/FLU/RSV testing.  Fact Sheet for Patients: EntrepreneurPulse.com.au  Fact Sheet for Healthcare Providers: IncredibleEmployment.be  This test is not yet approved or cleared by the [place] FDA and has been  authorized for detection and/or diagnosis of SARS-CoV-2 by FDA under an Emergency Use Authorization (EUA). This EUA will remain in effect (meaning this test can be used) for the duration of the COVID-19 declaration under Section 564(b)(1) of the Act, 21 U.S.C. section 360bbb-3(b)(1), unless the authorization is terminated or revoked.  Performed at [facility], [place] [address], [place], [place] [address]   Resp Panel by RT-PCR (Flu A&B, Covid) Nasopharyngeal Swab     Status: None   Collection Time: 02/13/21  8:16 AM   Specimen: Nasopharyngeal Swab; Nasopharyngeal(NP) swabs in vial transport medium  Result Value Ref Range Status   SARS Coronavirus 2 by RT PCR NEGATIVE NEGATIVE Final    Comment: (NOTE) SARS-CoV-2 target nucleic acids are NOT DETECTED.  The SARS-CoV-2 RNA is generally detectable in upper respiratory specimens during the acute phase of infection. The lowest concentration of SARS-CoV-2 viral copies this assay can detect  is 138 copies/mL. A negative result does not preclude SARS-Cov-2 infection and should not be used as the sole basis for treatment or other patient management decisions. A negative result may occur with  improper specimen collection/handling, submission of specimen other than nasopharyngeal swab, presence of viral mutation(s) within the areas targeted by this assay, and inadequate number of viral copies(<138 copies/mL). A negative result must be combined with clinical observations, patient history, and epidemiological information. The expected result is Negative.  Fact Sheet for Patients:  EntrepreneurPulse.com.au  Fact Sheet for Healthcare Providers:  IncredibleEmployment.be  This test is no t yet approved or cleared by the [place] FDA and  has been authorized for detection and/or diagnosis of SARS-CoV-2 by FDA under an Emergency Use Authorization (EUA). This EUA will remain  in effect (meaning this test  can be used) for the duration of the COVID-19 declaration under Section 564(b)(1) of the Act, 21 U.S.C.section 360bbb-3(b)(1), unless the authorization is terminated  or revoked sooner.       Influenza A by PCR NEGATIVE NEGATIVE Final   Influenza B by PCR NEGATIVE NEGATIVE Final    Comment: (NOTE) The Xpert Xpress SARS-CoV-2/FLU/RSV plus assay is intended as an aid in the diagnosis of influenza from Nasopharyngeal swab specimens and should not be used as a sole basis for treatment. Nasal washings and aspirates are unacceptable for Xpert Xpress SARS-CoV-2/FLU/RSV testing.  Fact Sheet for Patients: EntrepreneurPulse.com.au  Fact Sheet for Healthcare Providers: IncredibleEmployment.be  This test is not yet approved or cleared by the [place] FDA and has been authorized for detection and/or diagnosis of SARS-CoV-2 by FDA under an Emergency Use Authorization (EUA). This EUA will remain in effect (meaning this test can be used) for the duration of the COVID-19 declaration under Section 564(b)(1) of the Act, 21 U.S.C. section 360bbb-3(b)(1), unless the authorization is terminated or revoked.  Performed at [facility], [address] [place], [place] [address]          Radiology Studies: DG Chest 1 View  Result Date: 02/13/2021 CLINICAL DATA:  Difficulty breathing EXAM: CHEST  1 VIEW COMPARISON:  01/31/2021 FINDINGS: Transverse diameter of heart is increased. Central pulmonary vessels are less prominent. Prominence of hilar regions most likely is due to ectatic central pulmonary arteries. There are no signs of alveolar pulmonary edema or new focal infiltrates. There is no pleural effusion or pneumothorax. Tip of dialysis catheter is seen in the region of right atrium. Surgical clips are seen in the right axilla. There is a vascular stent in the course of right innominate. IMPRESSION: Cardiomegaly. There are no signs of pulmonary edema or new  focal infiltrates. Electronically Signed   By: Elmer Picker M.D.   On: 02/13/2021 13:56        Scheduled Meds:  amLODipine  10 mg Oral Daily   vitamin C  500 mg Oral BID   [START ON 02/15/2021] calcitRIOL  1.25 mcg Oral Q T,Th,Sa-HD   [START ON 02/15/2021] darbepoetin (ARANESP) injection - DIALYSIS  40 mcg Intravenous Q Sat-HD   dolutegravir  50 mg Oral q1600   lacosamide  150 mg Oral BID   lamiVUDine  50 mg Oral Daily   levETIRAcetam  1,000 mg Oral Daily   levETIRAcetam  250 mg Oral Q T,Th,Sat-1800   megestrol  400 mg Oral Daily   metoprolol tartrate  25 mg Oral BID   mirtazapine  15 mg Oral QHS   nystatin  5 mL Oral QID   pantoprazole  40 mg  Oral QAC breakfast   saccharomyces boulardii  250 mg Oral BID   thiamine  100 mg Oral Daily   venlafaxine  75 mg Oral BID   zidovudine  300 mg Oral q1600   Continuous Infusions:        Aline August, MD [facility] 02/14/2021, 7:29 AM

## 2021-02-14 NOTE — Progress Notes (Signed)
Bp cuff adjusted. Bp rechecked.

## 2021-02-14 NOTE — Progress Notes (Signed)
Uf turned off normal saline bolus given.

## 2021-02-14 NOTE — Progress Notes (Signed)
Pt is not feeling well and is refusing all meds. Pt states she will try tomorrow just not tonight. RN made MD aware that pt refused all meds including Vimpat. RN will continue to monitor and await MD response. Vitals WNL  [place]

## 2021-02-15 LAB — CBC WITH DIFFERENTIAL/PLATELET
Abs Immature Granulocytes: 0.04 10*3/uL (ref 0.00–0.07)
Basophils Absolute: 0 10*3/uL (ref 0.0–0.1)
Basophils Relative: 0 %
Eosinophils Absolute: 0 10*3/uL (ref 0.0–0.5)
Eosinophils Relative: 0 %
HCT: 26.7 % — ABNORMAL LOW (ref 36.0–46.0)
Hemoglobin: 8.7 g/dL — ABNORMAL LOW (ref 12.0–15.0)
Immature Granulocytes: 1 %
Lymphocytes Relative: 22 %
Lymphs Abs: 1.3 10*3/uL (ref 0.7–4.0)
MCH: 29.5 pg (ref 26.0–34.0)
MCHC: 32.6 g/dL (ref 30.0–36.0)
MCV: 90.5 fL (ref 80.0–100.0)
Monocytes Absolute: 0.4 10*3/uL (ref 0.1–1.0)
Monocytes Relative: 7 %
Neutro Abs: 4.2 10*3/uL (ref 1.7–7.7)
Neutrophils Relative %: 70 %
Platelets: 202 10*3/uL (ref 150–400)
RBC: 2.95 MIL/uL — ABNORMAL LOW (ref 3.87–5.11)
RDW: 19.9 % — ABNORMAL HIGH (ref 11.5–15.5)
WBC: 6 10*3/uL (ref 4.0–10.5)
nRBC: 0 % (ref 0.0–0.2)

## 2021-02-15 LAB — GASTROINTESTINAL PANEL BY PCR, STOOL (REPLACES STOOL CULTURE)

## 2021-02-15 LAB — RENAL FUNCTION PANEL
Albumin: 2.3 g/dL — ABNORMAL LOW (ref 3.5–5.0)
Anion gap: 9 (ref 5–15)
BUN: 18 mg/dL (ref 8–23)
CO2: 27 mmol/L (ref 22–32)
Calcium: 8.6 mg/dL — ABNORMAL LOW (ref 8.9–10.3)
Chloride: 100 mmol/L (ref 98–111)
Creatinine, Ser: 4.52 mg/dL — ABNORMAL HIGH (ref 0.44–1.00)
GFR, Estimated: 10 mL/min — ABNORMAL LOW (ref >60)
Glucose, Bld: 95 mg/dL (ref 70–99)
Phosphorus: 3.9 mg/dL (ref 2.5–4.6)
Potassium: 3.2 mmol/L — ABNORMAL LOW (ref 3.5–5.1)
Sodium: 136 mmol/L (ref 135–145)

## 2021-02-15 LAB — HEPATITIS B SURFACE ANTIBODY, QUANTITATIVE: Hep B S AB Quant (Post): >1000 m[IU]/mL (ref >9.9)

## 2021-02-15 MED ORDER — CHLORHEXIDINE GLUCONATE CLOTH 2 % EX PADS
6.0000 | MEDICATED_PAD | Freq: Every day | CUTANEOUS | Status: DC
  Administered 2021-02-15 – 2021-02-21 (×6): 6 via TOPICAL

## 2021-02-15 MED ORDER — LOPERAMIDE HCL 2 MG PO CAPS
2.0000 mg | ORAL_CAPSULE | Freq: Four times a day (QID) | ORAL | Status: DC | PRN
  Administered 2021-02-15: 2 mg via ORAL
  Filled 2021-02-15: qty 1

## 2021-02-15 NOTE — Progress Notes (Signed)
[facility] Progress Note   Interim History: [facility] 11/24 -02/10/21 with acute encephalopathy/seizures secondary to PRES. Also had right arm fistula infection with rupture of pseudoaneurysm on 12/5 and underwent emergent OR repair -brachial to brachial bypass with saphenous vein harvesting. Wound vac placed by vascular. Eliquis and Plavix initially held, but Eliquis was resumed at discharge. Presented to [facility] ED this morning with bleeding from surgical wound. Transferred to [facility] for vascular evaluation.   Subjective:  Seen in HD unit. Treatment terminated early yesterday d/t hypotension. Feels ok today, still having a lot of diarrhea.   Objective Vitals:   02/14/21 1424 02/14/21 1631 02/14/21 2101 02/15/21 0520  BP: 119/71 139/74 (!) 156/79 (!) 152/86  Pulse:  74 70 (!) 106  Resp: 20 18 18 18   Temp: 97.9 F (36.6 C) 98.1 F (36.7 C) 98.9 F (37.2 C) 98.2 F (36.8 C)  TempSrc: Oral Oral Oral   SpO2: 99% 95% 95% 98%  Weight: 36.8 kg     Height:          Additional Objective Labs: Basic Metabolic Panel: Recent Labs  Lab 02/13/21 0647 02/14/21 0055 02/15/21 0229  NA 139 137 136  K 3.7 4.2 3.2*  CL 102 102 100  CO2 24 24 27   GLUCOSE 118* 91 95  BUN 32* 36* 18  CREATININE 6.29* 7.38* 4.52*  CALCIUM 7.8* 8.1* 8.6*  PHOS  --  4.7* 3.9    CBC: Recent Labs  Lab 02/09/21 0224 02/10/21 0709 02/13/21 0647 02/13/21 1609 02/14/21 0055 02/15/21 0229  WBC 8.2 9.8 8.7  --   --  6.0  NEUTROABS  --   --  7.2  --   --  4.2  HGB 9.5* 9.6* 8.0* 6.6* 8.7* 8.7*  HCT 29.9* 30.2* 26.7* 22.5* 26.6* 26.7*  MCV 94.3 94.4 101.1*  --   --  90.5  PLT 189 205 268  --   --  202    Blood Culture    Component Value Date/Time   SDES BLOOD BLOOD LEFT HAND 02/08/2021 1159   SPECREQUEST AEROBIC BOTTLE ONLY Blood Culture adequate volume 02/08/2021 1159   CULT  02/08/2021 1159    NO GROWTH 5 DAYS Performed at [facility], [place] [address] [place], [place]  [address]    REPTSTATUS 02/13/2021 FINAL 02/08/2021 1159     Physical Exam General: Alert, chronically ill appearing, nad  Heart: RRR No m,r,g  Lungs: Clear bilaterally  Abdomen: soft non-tender  Extremities: No LE edema; RUE in ACE wrap Dialysis Access: [name]   Medications:    Dialysis Orders:  [name] TTS,EDW 41.5 kg,[place] 2K/2.5 ca,BF 350  DF 500,Time - 180 minutes Access catheter  Medications - out of hectorol and now on calcitriol - 1.25 mcg each tx. Sensipar 90 mg three times a week; Epogen 3600 units each tx, Heparin 1000 units bolus then 500 units per hour Has been on vanc 500 mg each tx (though misses often)  Assessment/Plan: 1. Bleeding from surgical wound. Ruptured pseudoaneurysm s/p right brachial-brachial bypass with ligation of the native brachial artery on 02/03/21.  Per Vascular. Seen by [name] -VVS following for wound care  2. ESRD - Usual HD TTS. Missed Thursday. HD Friday. Back on schedule --short HD today to get back on schedule  No heparin   3. HTN/volume- BP/volume stable. Even UF today.  4. Anemia-  Hgb 8.7  Transfused 1 unit prbcs on 12/16.  on ESA. Follow trends. 5.  MBD - Continue calcitriol/binders. Hold Sensipar for now d/t GI upset . Follow Ca trends. Ca/Phos ok.  6. Diarrhea - C. diff antigen negative. Per primary  6. Nutrition - Renal diet/fluid restriction   Lynnda Child PA-C [facility] 02/15/2021,9:20 AM

## 2021-02-15 NOTE — Progress Notes (Signed)
Progress Note    02/15/2021 9:56 AM   Subjective: Complaints, denies fevers chills  afebrile  Vitals:   02/15/21 0914 02/15/21 0930  BP: 125/87 140/72  Pulse: 99 66  Resp: (!) 21 18  Temp:    SpO2:      Physical Exam: General:  resting comfortably Lungs:  non labored Incisions: On dressing removal some incisional bleeding at the proximal aspect, minor. Distally, the open ulceration demonstrated some purulence from the distal aspect.  This was expressed. Palpable pulse at the wrist    CBC    Component Value Date/Time   WBC 6.0 02/15/2021 0229   RBC 2.95 (L) 02/15/2021 0229   HGB 8.7 (L) 02/15/2021 0229   HCT 26.7 (L) 02/15/2021 0229   PLT 202 02/15/2021 0229   MCV 90.5 02/15/2021 0229   MCH 29.5 02/15/2021 0229   MCHC 32.6 02/15/2021 0229   RDW 19.9 (H) 02/15/2021 0229   LYMPHSABS 1.3 02/15/2021 0229   MONOABS 0.4 02/15/2021 0229   EOSABS 0.0 02/15/2021 0229   BASOSABS 0.0 02/15/2021 0229    BMET    Component Value Date/Time   NA 136 02/15/2021 0229   K 3.2 (L) 02/15/2021 0229   CL 100 02/15/2021 0229   CO2 27 02/15/2021 0229   GLUCOSE 95 02/15/2021 0229   BUN 18 02/15/2021 0229   CREATININE 4.52 (H) 02/15/2021 0229   CREATININE 5.66 (H) 12/22/2019 1117   CALCIUM 8.6 (L) 02/15/2021 0229   CALCIUM 9.0 03/12/2011 0527   GFRNONAA 10 (L) 02/15/2021 0229   GFRNONAA 5 (L) 02/12/2015 1000   GFRAA 9 (L) 10/22/2019 0105   GFRAA 5 (L) 02/12/2015 1000    INR    Component Value Date/Time   INR 1.2 01/24/2021 1054     Intake/Output Summary (Last 24 hours) at 02/15/2021 0956 Last data filed at 02/15/2021 0800 Gross per 24 hour  Intake 540 ml  Output 521 ml  Net 19 ml      Assessment/Plan:  70 y.o. female is s/p:  1) re-do right brachial exposure and control of hemorrhage 2) harvest right greater saphenous vein 3) right brachial-brachial bypass (end-to-end, reversed greater saphenous vein, subfascial) 4) application of skin substitute to right  upper extremity (Myriad 7x10cm) By Dr. Stanford Breed.     Patient with excellent pulse at the wrist.  Stable site clean, mild venous ooze appreciated.  This is to be dermal. Distally at the ulceration site, there was purulence.  This was expressed, did not appear to be tracking. Will continue to follow wound. Continue to hold Eliquis for another day.  Broadus John MD

## 2021-02-15 NOTE — Progress Notes (Signed)
Patient ID: [name], female   DOB: 09-Jun-1950, 70 y.o.   MRN: [id number]  PROGRESS NOTE    [name]  WUJ:[id number] DOB: 22-Feb-1951 DOA: 02/13/2021 PCP: Hilbert Corrigan, MD   Brief Narrative:  70 y.o. female with medical history significant of ESRD on HD, atrial flutter on Eliquis, pulmonary hypertension, HIV, and GERD, recent fistula infection on vancomycin treatment with HD as an outpatient, recent admission from 01/23/2021-12/12 /2022 for syncopal episode while undergoing hemodialysis; subsequently patient was noted to be hypertensive and had seizure for which MRI revealed concern for PRES requiring Cardene drip and transferred to ICU for continuous EEG monitoring.  During hospitalization patient was noted to have a pseudoaneurysm rupture of the right arm fistula taken to the OR emergently and underwent redo of the right brachial exposure and control of hemorrhage, harvest right greater saphenous vein, right brachial brachial bypass, and application of skin substitute to the right upper extremity.  Eliquis and Plavix were initially held and she required transfusion of 2 units of packed red blood cells on 12/6.  Hemoglobin had remained stable and Eliquis only was resumed on 12/7. She presented back to the ED with bleeding from fistula; on presentation in the ED, hemoglobin was 8 with subsequent hemoglobin of 6.6.  Vascular surgery and nephrology were consulted.  Assessment & Plan:   Bleeding around AV fistula wound -Patient with recent history of ruptured pseudoaneurysm status post brachial brachial bypass with ligation of the need of brachial artery on 12/5.  Vascular surgery consulted discontinued VAC, put in orders for wound care, and recommended holding anticoagulation in the acute setting. -Follow vascular surgery recommendations: Dressing changes as per vascular surgery.  Anticoagulation to remain on hold till cleared by vascular surgery.  Acute blood loss  anemia/anemia of chronic disease from renal failure -Hemoglobin 6.6 on presentation.  Status post 1 unit packed red cell transfusion; hemoglobin 8.7 this morning.  Monitor H&H.  End-stage renal disease on hemodialysis -Nephrology following.  Dialysis as per nephrology schedule  Atrial flutter on chronic anticoagulation -Continue metoprolol.  Intermittently tachycardic mildly.  Eliquis on hold  Nausea, vomiting and diarrhea -Patient was on scheduled Senokot at the rehab.  Senokot on hold.  Monitor.  Advance diet as tolerated: Patient's oral intake has been poor - use antiemetics as needed -Still having a lot of diarrhea.  Stool for C. difficile is positive for antigen but negative for toxin or PCR: Possibly colonizer; GI panel PCR pending.  Will not treat for significantly since C. difficile toxin and PCR are both negative.  HIV  -continue current antiretroviral therapy  Essential hypertension -Blood pressure on the lower side.  Continue amlodipine and metoprolol  GERD -Continue PPI  Generalized deconditioning -Patient currently from SNF and will return back to SNF once medically improves.  Social worker following  DVT prophylaxis: SCDs Code Status: DNR Family Communication: None at bedside Disposition Plan: Status is: Inpatient because: Of need for continued wound care and further vascular surgery recommendations.  Patient having severe diarrhea as well.  Consultants: Vascular surgery/nephrology  Procedures: None  Antimicrobials: Antiretrovirals  Subjective: Patient seen and examined at bedside.  Poor historian.  Nursing staff reports that patient had a lot of diarrhea yesterday.  Oral intake was poor yesterday.  No overnight fever, seizures reported. Objective: Vitals:   02/14/21 1424 02/14/21 1631 02/14/21 2101 02/15/21 0520  BP: 119/71 139/74 (!) 156/79 (!) 152/86  Pulse:  74 70 (!) 106  Resp: 20 18 18 18   Temp:  97.9 F (36.6 C) 98.1 F (36.7 C) 98.9 F (37.2 C)  98.2 F (36.8 C)  TempSrc: Oral Oral Oral   SpO2: 99% 95% 95% 98%  Weight: 36.8 kg     Height:        Intake/Output Summary (Last 24 hours) at 02/15/2021 0708 Last data filed at 02/15/2021 0520 Gross per 24 hour  Intake 240 ml  Output 521 ml  Net -281 ml    Filed Weights   02/13/21 0531 02/14/21 1105 02/14/21 1424  Weight: 38 kg 37.3 kg 36.8 kg    Examination:  General exam: No distress.  Currently on room air.  Looks chronically ill and deconditioned.  Respiratory system: Decreased breath sounds at bases bilaterally with some crackles Cardiovascular system: Intermittently tachycardic; S1-S2 heard gastrointestinal system: Abdomen is distended slightly, soft and nontender.  Bowel sounds are heard  extremities: Bilateral lower extremity edema present; no clubbing Central nervous system: Sleepy, wakes up very slightly; still extremely slow to respond, very poor historian; no focal neurological deficits.  Moves extremities Skin: Right upper extremity dressing present.  No obvious other rashes are noted  psychiatry: Very flat affect.  Hardly participates in any conversation. Rectal tube in place  Data Reviewed: I have personally reviewed following labs and imaging studies  CBC: Recent Labs  Lab 02/09/21 0224 02/10/21 0709 02/13/21 0647 02/13/21 1609 02/14/21 0055 02/15/21 0229  WBC 8.2 9.8 8.7  --   --  6.0  NEUTROABS  --   --  7.2  --   --  4.2  HGB 9.5* 9.6* 8.0* 6.6* 8.7* 8.7*  HCT 29.9* 30.2* 26.7* 22.5* 26.6* 26.7*  MCV 94.3 94.4 101.1*  --   --  90.5  PLT 189 205 268  --   --  409    Basic Metabolic Panel: Recent Labs  Lab 02/13/21 0647 02/14/21 0055 02/15/21 0229  NA 139 137 136  K 3.7 4.2 3.2*  CL 102 102 100  CO2 24 24 27   GLUCOSE 118* 91 95  BUN 32* 36* 18  CREATININE 6.29* 7.38* 4.52*  CALCIUM 7.8* 8.1* 8.6*  PHOS  --  4.7* 3.9    GFR: Estimated Creatinine Clearance: 6.9 mL/min (A) (by C-G formula based on SCr of 4.52 mg/dL (H)). Liver  Function Tests: Recent Labs  Lab 02/14/21 0055 02/15/21 0229  ALBUMIN 2.1* 2.3*    No results for input(s): LIPASE, AMYLASE in the last 168 hours. No results for input(s): AMMONIA in the last 168 hours. Coagulation Profile: No results for input(s): INR, PROTIME in the last 168 hours. Cardiac Enzymes: No results for input(s): CKTOTAL, CKMB, CKMBINDEX, TROPONINI in the last 168 hours. BNP (last 3 results) No results for input(s): PROBNP in the last 8760 hours. HbA1C: No results for input(s): HGBA1C in the last 72 hours. CBG: Recent Labs  Lab 02/09/21 2349 02/10/21 0408 02/10/21 0822 02/10/21 1235 02/10/21 2048  GLUCAP 127* 85 68* 83 89    Lipid Profile: No results for input(s): CHOL, HDL, LDLCALC, TRIG, CHOLHDL, LDLDIRECT in the last 72 hours. Thyroid Function Tests: No results for input(s): TSH, T4TOTAL, FREET4, T3FREE, THYROIDAB in the last 72 hours. Anemia Panel: No results for input(s): VITAMINB12, FOLATE, FERRITIN, TIBC, IRON, RETICCTPCT in the last 72 hours. Sepsis Labs: No results for input(s): PROCALCITON, LATICACIDVEN in the last 168 hours.  Recent Results (from the past 240 hour(s))  Culture, blood (Routine X 2) w Reflex to ID Panel     Status: None   Collection Time:  02/08/21 11:34 AM   Specimen: BLOOD  Result Value Ref Range Status   Specimen Description BLOOD BLOOD LEFT HAND  Final   Special Requests   Final    BOTTLES DRAWN AEROBIC AND ANAEROBIC Blood Culture results may not be optimal due to an inadequate volume of blood received in culture bottles   Culture   Final    NO GROWTH 5 DAYS Performed at [facility], [place] [address] [place], [place] [address]    Report Status 02/13/2021 FINAL  Final  Culture, blood (Routine X 2) w Reflex to ID Panel     Status: None   Collection Time: 02/08/21 11:59 AM   Specimen: BLOOD  Result Value Ref Range Status   Specimen Description BLOOD BLOOD LEFT HAND  Final   Special Requests AEROBIC BOTTLE ONLY Blood  Culture adequate volume  Final   Culture   Final    NO GROWTH 5 DAYS Performed at [facility], [place] [address], [place], [place] [address]    Report Status 02/13/2021 FINAL  Final  Resp Panel by RT-PCR (Flu A&B, Covid) Nasopharyngeal Swab     Status: None   Collection Time: 02/10/21 12:57 PM   Specimen: Nasopharyngeal Swab; Nasopharyngeal(NP) swabs in vial transport medium  Result Value Ref Range Status   SARS Coronavirus 2 by RT PCR NEGATIVE NEGATIVE Final    Comment: (NOTE) SARS-CoV-2 target nucleic acids are NOT DETECTED.  The SARS-CoV-2 RNA is generally detectable in upper respiratory specimens during the acute phase of infection. The lowest concentration of SARS-CoV-2 viral copies this assay can detect is 138 copies/mL. A negative result does not preclude SARS-Cov-2 infection and should not be used as the sole basis for treatment or other patient management decisions. A negative result may occur with  improper specimen collection/handling, submission of specimen other than nasopharyngeal swab, presence of viral mutation(s) within the areas targeted by this assay, and inadequate number of viral copies(<138 copies/mL). A negative result must be combined with clinical observations, patient history, and epidemiological information. The expected result is Negative.  Fact Sheet for Patients:  EntrepreneurPulse.com.au  Fact Sheet for Healthcare Providers:  IncredibleEmployment.be  This test is no t yet approved or cleared by the [place] FDA and  has been authorized for detection and/or diagnosis of SARS-CoV-2 by FDA under an Emergency Use Authorization (EUA). This EUA will remain  in effect (meaning this test can be used) for the duration of the COVID-19 declaration under Section 564(b)(1) of the Act, 21 U.S.C.section 360bbb-3(b)(1), unless the authorization is terminated  or revoked sooner.       Influenza A by PCR NEGATIVE  NEGATIVE Final   Influenza B by PCR NEGATIVE NEGATIVE Final    Comment: (NOTE) The Xpert Xpress SARS-CoV-2/FLU/RSV plus assay is intended as an aid in the diagnosis of influenza from Nasopharyngeal swab specimens and should not be used as a sole basis for treatment. Nasal washings and aspirates are unacceptable for Xpert Xpress SARS-CoV-2/FLU/RSV testing.  Fact Sheet for Patients: EntrepreneurPulse.com.au  Fact Sheet for Healthcare Providers: IncredibleEmployment.be  This test is not yet approved or cleared by the [place] FDA and has been authorized for detection and/or diagnosis of SARS-CoV-2 by FDA under an Emergency Use Authorization (EUA). This EUA will remain in effect (meaning this test can be used) for the duration of the COVID-19 declaration under Section 564(b)(1) of the Act, 21 U.S.C. section 360bbb-3(b)(1), unless the authorization is terminated or revoked.  Performed at [facility], [address], [place], [place] [address]   Resp Panel by RT-PCR (Flu A&B, Covid) Nasopharyngeal Swab     Status: None   Collection Time: 02/13/21  8:16 AM   Specimen: Nasopharyngeal Swab; Nasopharyngeal(NP) swabs in vial transport medium  Result Value Ref Range Status   SARS Coronavirus 2 by RT PCR NEGATIVE NEGATIVE Final    Comment: (NOTE) SARS-CoV-2 target nucleic acids are NOT DETECTED.  The SARS-CoV-2 RNA is generally detectable in upper respiratory specimens during the acute phase of infection. The lowest concentration of SARS-CoV-2 viral copies this assay can detect is 138 copies/mL. A negative result does not preclude SARS-Cov-2 infection and should not be used as the sole basis for treatment or other patient management decisions. A negative result may occur with  improper specimen collection/handling, submission of specimen other than nasopharyngeal swab, presence of viral mutation(s) within the areas targeted by this assay,  and inadequate number of viral copies(<138 copies/mL). A negative result must be combined with clinical observations, patient history, and epidemiological information. The expected result is Negative.  Fact Sheet for Patients:  EntrepreneurPulse.com.au  Fact Sheet for Healthcare Providers:  IncredibleEmployment.be  This test is no t yet approved or cleared by the [place] FDA and  has been authorized for detection and/or diagnosis of SARS-CoV-2 by FDA under an Emergency Use Authorization (EUA). This EUA will remain  in effect (meaning this test can be used) for the duration of the COVID-19 declaration under Section 564(b)(1) of the Act, 21 U.S.C.section 360bbb-3(b)(1), unless the authorization is terminated  or revoked sooner.       Influenza A by PCR NEGATIVE NEGATIVE Final   Influenza B by PCR NEGATIVE NEGATIVE Final    Comment: (NOTE) The Xpert Xpress SARS-CoV-2/FLU/RSV plus assay is intended as an aid in the diagnosis of influenza from Nasopharyngeal swab specimens and should not be used as a sole basis for treatment. Nasal washings and aspirates are unacceptable for Xpert Xpress SARS-CoV-2/FLU/RSV testing.  Fact Sheet for Patients: EntrepreneurPulse.com.au  Fact Sheet for Healthcare Providers: IncredibleEmployment.be  This test is not yet approved or cleared by the [place] FDA and has been authorized for detection and/or diagnosis of SARS-CoV-2 by FDA under an Emergency Use Authorization (EUA). This EUA will remain in effect (meaning this test can be used) for the duration of the COVID-19 declaration under Section 564(b)(1) of the Act, 21 U.S.C. section 360bbb-3(b)(1), unless the authorization is terminated or revoked.  Performed at [facility], [address], [place], [place] [address]   C Difficile Quick Screen w PCR reflex     Status: Abnormal   Collection Time: 02/14/21  4:12 PM    Specimen: STOOL  Result Value Ref Range Status   C Diff antigen POSITIVE (A) NEGATIVE Final    Comment: CRITICAL RESULT CALLED TO, READ BACK BY AND VERIFIED WITH: K. MASHORE RN 02/14/2021 @1949  [place] JW    C Diff toxin NEGATIVE NEGATIVE Final   C Diff interpretation Results are indeterminate. See PCR results.  Final    Comment: Performed at [facility], [place] [address] [place], [place] [address]  C. Diff by PCR, Reflexed     Status: None   Collection Time: 02/14/21  4:12 PM  Result Value Ref Range Status   Toxigenic C. Difficile by PCR NEGATIVE NEGATIVE Final    Comment: Patient is colonized with non toxigenic C. difficile. May not need treatment unless significant symptoms are present. Performed at [facility], [place]  [address], [place], [place] [address]           Radiology Studies: DG Chest 1 View  Result Date: 02/13/2021 CLINICAL DATA:  Difficulty breathing EXAM: CHEST  1 VIEW COMPARISON:  01/31/2021 FINDINGS: Transverse diameter of heart is increased. Central pulmonary vessels are less prominent. Prominence of hilar regions most likely is due to ectatic central pulmonary arteries. There are no signs of alveolar pulmonary edema or new focal infiltrates. There is no pleural effusion or pneumothorax. Tip of dialysis catheter is seen in the region of right atrium. Surgical clips are seen in the right axilla. There is a vascular stent in the course of right innominate. IMPRESSION: Cardiomegaly. There are no signs of pulmonary edema or new focal infiltrates. Electronically Signed   By: Elmer Picker M.D.   On: 02/13/2021 13:56        Scheduled Meds:  amLODipine  10 mg Oral Daily   vitamin C  500 mg Oral BID   calcitRIOL  1.25 mcg Oral Q T,Th,Sa-HD   Chlorhexidine Gluconate Cloth  6 each Topical Daily   darbepoetin (ARANESP) injection - DIALYSIS  40 mcg Intravenous Q Sat-HD   dolutegravir  50 mg Oral q1600   lacosamide  150 mg Oral BID   lamiVUDine  50 mg Oral  Daily   levETIRAcetam  1,000 mg Oral Daily   levETIRAcetam  250 mg Oral Q T,Th,Sat-1800   megestrol  400 mg Oral Daily   metoprolol tartrate  25 mg Oral BID   mirtazapine  15 mg Oral QHS   nystatin  5 mL Oral QID   ondansetron  4 mg Oral Q6H   pantoprazole  40 mg Oral QAC breakfast   saccharomyces boulardii  250 mg Oral BID   thiamine  100 mg Oral Daily   venlafaxine  75 mg Oral BID   zidovudine  300 mg Oral q1600   Continuous Infusions:  sodium chloride     sodium chloride            Aline August, MD [facility] 02/15/2021, 7:08 AM

## 2021-02-15 NOTE — Plan of Care (Signed)
  Problem: Clinical Measurements: Goal: Ability to maintain clinical measurements within normal limits will improve Outcome: Progressing   Problem: Pain Managment: Goal: General experience of comfort will improve Outcome: Progressing   

## 2021-02-16 LAB — CBC WITH DIFFERENTIAL/PLATELET
Abs Immature Granulocytes: 0.03 10*3/uL (ref 0.00–0.07)
Basophils Absolute: 0 10*3/uL (ref 0.0–0.1)
Basophils Relative: 0 %
Eosinophils Absolute: 0.1 10*3/uL (ref 0.0–0.5)
Eosinophils Relative: 1 %
HCT: 23.8 % — ABNORMAL LOW (ref 36.0–46.0)
Hemoglobin: 7.7 g/dL — ABNORMAL LOW (ref 12.0–15.0)
Immature Granulocytes: 1 %
Lymphocytes Relative: 25 %
Lymphs Abs: 1.4 10*3/uL (ref 0.7–4.0)
MCH: 29.8 pg (ref 26.0–34.0)
MCHC: 32.4 g/dL (ref 30.0–36.0)
MCV: 92.2 fL (ref 80.0–100.0)
Monocytes Absolute: 0.4 10*3/uL (ref 0.1–1.0)
Monocytes Relative: 7 %
Neutro Abs: 3.7 10*3/uL (ref 1.7–7.7)
Neutrophils Relative %: 66 %
Platelets: 193 10*3/uL (ref 150–400)
RBC: 2.58 MIL/uL — ABNORMAL LOW (ref 3.87–5.11)
RDW: 19.7 % — ABNORMAL HIGH (ref 11.5–15.5)
WBC: 5.6 10*3/uL (ref 4.0–10.5)
nRBC: 0 % (ref 0.0–0.2)

## 2021-02-16 LAB — RENAL FUNCTION PANEL
Albumin: 2.2 g/dL — ABNORMAL LOW (ref 3.5–5.0)
Anion gap: 11 (ref 5–15)
BUN: 12 mg/dL (ref 8–23)
CO2: 25 mmol/L (ref 22–32)
Calcium: 8.8 mg/dL — ABNORMAL LOW (ref 8.9–10.3)
Chloride: 102 mmol/L (ref 98–111)
Creatinine, Ser: 4.27 mg/dL — ABNORMAL HIGH (ref 0.44–1.00)
GFR, Estimated: 11 mL/min — ABNORMAL LOW (ref >60)
Glucose, Bld: 89 mg/dL (ref 70–99)
Phosphorus: 3.6 mg/dL (ref 2.5–4.6)
Potassium: 3 mmol/L — ABNORMAL LOW (ref 3.5–5.1)
Sodium: 138 mmol/L (ref 135–145)

## 2021-02-16 LAB — GLUCOSE, CAPILLARY: Glucose-Capillary: 92 mg/dL (ref 70–99)

## 2021-02-16 MED ORDER — BICTEGRAVIR-EMTRICITAB-TENOFOV 50-200-25 MG PO TABS
1.0000 | ORAL_TABLET | Freq: Every day | ORAL | Status: DC
Start: 2021-02-16 — End: 2021-02-21
  Administered 2021-02-16 – 2021-02-20 (×5): 1 via ORAL
  Filled 2021-02-16 (×6): qty 1

## 2021-02-16 MED ORDER — PROSOURCE PLUS PO LIQD
30.0000 mL | Freq: Two times a day (BID) | ORAL | Status: DC
  Administered 2021-02-16 – 2021-02-21 (×7): 30 mL via ORAL
  Filled 2021-02-16 (×7): qty 30

## 2021-02-16 NOTE — Progress Notes (Signed)
[facility] Progress Note   Interim History: [facility] 11/24 -02/10/21 with acute encephalopathy/seizures secondary to PRES. Also had right arm fistula infection with rupture of pseudoaneurysm on 12/5 and underwent emergent OR repair -brachial to brachial bypass with saphenous vein harvesting. Wound vac placed by vascular. Eliquis and Plavix initially held, but Eliquis was resumed at discharge. Presented to [facility] ED this morning with bleeding from surgical wound. Transferred to [facility] for vascular evaluation.   Subjective:  Dialysis yesterday - no UF Feeling weak, tired this am.   Objective Vitals:   02/15/21 1153 02/15/21 1816 02/15/21 2058 02/16/21 0510  BP: 113/66 117/76 118/80 126/81  Pulse: (!) 107 96 94 83  Resp: 19 17 18 18   Temp: 98.3 F (36.8 C) 98.3 F (36.8 C) 99 F (37.2 C) 98 F (36.7 C)  TempSrc: Oral Oral Oral Oral  SpO2: 100% 96% 95% 95%  Weight:    40.6 kg  Height:          Additional Objective Labs: Basic Metabolic Panel: Recent Labs  Lab 02/14/21 0055 02/15/21 0229 02/16/21 0257  NA 137 136 138  K 4.2 3.2* 3.0*  CL 102 100 102  CO2 24 27 25   GLUCOSE 91 95 89  BUN 36* 18 12  CREATININE 7.38* 4.52* 4.27*  CALCIUM 8.1* 8.6* 8.8*  PHOS 4.7* 3.9 3.6    CBC: Recent Labs  Lab 02/10/21 0709 02/13/21 0647 02/13/21 1609 02/14/21 0055 02/15/21 0229 02/16/21 0257  WBC 9.8 8.7  --   --  6.0 5.6  NEUTROABS  --  7.2  --   --  4.2 3.7  HGB 9.6* 8.0*   < > 8.7* 8.7* 7.7*  HCT 30.2* 26.7*   < > 26.6* 26.7* 23.8*  MCV 94.4 101.1*  --   --  90.5 92.2  PLT 205 268  --   --  202 193   < > = values in this interval not displayed.    Blood Culture    Component Value Date/Time   SDES BLOOD BLOOD LEFT HAND 02/08/2021 1159   SPECREQUEST AEROBIC BOTTLE ONLY Blood Culture adequate volume 02/08/2021 1159   CULT  02/08/2021 1159    NO GROWTH 5 DAYS Performed at [facility], [place] [address] [place],  [address]    REPTSTATUS  02/13/2021 FINAL 02/08/2021 1159     Physical Exam General: Alert, chronically ill appearing, nad  Heart: RRR No m,r,g  Lungs: Clear bilaterally  Abdomen: soft non-tender  Extremities: No LE edema; RUE in ACE wrap Dialysis Access: [name]   Medications:    Dialysis Orders:  [name] TTS,EDW 41.5 kg,Bath 2K/2.5 ca,BF 350  DF 500,Time - 180 minutes Access catheter  Medications - out of hectorol and now on calcitriol - 1.25 mcg each tx. Sensipar 90 mg three times a week; Epogen 3600 units each tx, Heparin 1000 units bolus then 500 units per hour Has been on vanc 500 mg each tx (though misses often)  Assessment/Plan: 1. Bleeding from surgical wound. Ruptured pseudoaneurysm s/p right brachial-brachial bypass with ligation of the native brachial artery on 02/03/21.  Per Vascular. Seen by [name] -VVS following for wound care  2. ESRD - Usual HD TTS. Back on schedule. Next HD 12/20. No heparin . Added K+ bath  3. Hypokalemia -in the setting of ongoing diarrhea.  4. HTN/volume- BP/volume stable. May be getting hypovolemic from diarrhea.  5.  Anemia-  Hgb 8.7>7.7   Transfused 1 unit  prbcs on 12/16.  Aranesp 40 q week starting 12/17.  Follow trends. 6. MBD - Continue calcitriol/binders. Hold Sensipar for now d/t GI upset . Follow Ca trends. Ca/Phos ok.  7. Diarrhea - C. diff PCR negative. Now with rectal tube. Per primary  8. Nutrition - Liberalize diet as tolerated. Add prot supp for low albumin   Lynnda Child PA-C [facility] 02/16/2021,9:49 AM

## 2021-02-16 NOTE — Progress Notes (Signed)
Patient ID: [name], female   DOB: 07-12-1950, 70 y.o.   MRN: [id number]  PROGRESS NOTE    [name]  TML:[id number] DOB: Oct 31, 1950 DOA: 02/13/2021 PCP: Hilbert Corrigan, MD   Brief Narrative:  70 y.o. female with medical history significant of ESRD on HD, atrial flutter on Eliquis, pulmonary hypertension, HIV, and GERD, recent fistula infection on vancomycin treatment with HD as an outpatient, recent admission from 01/23/2021-12/12 /2022 for syncopal episode while undergoing hemodialysis; subsequently patient was noted to be hypertensive and had seizure for which MRI revealed concern for PRES requiring Cardene drip and transferred to ICU for continuous EEG monitoring.  During hospitalization patient was noted to have a pseudoaneurysm rupture of the right arm fistula taken to the OR emergently and underwent redo of the right brachial exposure and control of hemorrhage, harvest right greater saphenous vein, right brachial brachial bypass, and application of skin substitute to the right upper extremity.  Eliquis and Plavix were initially held and she required transfusion of 2 units of packed red blood cells on 12/6.  Hemoglobin had remained stable and Eliquis only was resumed on 12/7. She presented back to the ED with bleeding from fistula; on presentation in the ED, hemoglobin was 8 with subsequent hemoglobin of 6.6.  Vascular surgery and nephrology were consulted.  Assessment & Plan:   Bleeding around AV fistula wound -Patient with recent history of ruptured pseudoaneurysm status post brachial brachial bypass with ligation of the need of brachial artery on 12/5.  Vascular surgery consulted discontinued VAC, put in orders for wound care, and recommended holding anticoagulation in the acute setting. -Follow vascular surgery recommendations: Dressing changes as per vascular surgery.  Anticoagulation to remain on hold till cleared by vascular surgery.  Acute blood loss  anemia/anemia of chronic disease from renal failure -Hemoglobin 6.6 on presentation.  Status post 1 unit packed red cell transfusion; hemoglobin 7.7 this morning.  Monitor H&H.  End-stage renal disease on hemodialysis -Nephrology following.  Dialysis as per nephrology schedule  hypokalemia -Monitor  Atrial flutter on chronic anticoagulation -Continue metoprolol.  Intermittently tachycardic mildly.  Eliquis on hold  Nausea, vomiting and diarrhea -Patient was on scheduled Senokot at the rehab.  Senokot on hold.  Monitor.  Advance diet as tolerated: Patient's oral intake has been poor - use antiemetics as needed -Still having a lot of diarrhea.  Stool for C. difficile is positive for antigen but negative for toxin or PCR: Possibly colonizer; GI panel PCR pending.  Will not treat for for C. difficile colitis since C. difficile toxin and PCR are both negative.  Use Imodium as needed  HIV  -continue current antiretroviral therapy  Essential hypertension -Blood pressure on the lower side.  Continue amlodipine and metoprolol  GERD -Continue PPI  Generalized deconditioning -Patient currently from SNF and will return back to SNF once medically improves.  Social worker following  DVT prophylaxis: SCDs Code Status: DNR Family Communication: None at bedside Disposition Plan: Status is: Inpatient because: Of need for continued wound care and further vascular surgery recommendations.  Patient having severe diarrhea as well.  Consultants: Vascular surgery/nephrology  Procedures: None  Antimicrobials: Antiretrovirals  Subjective: Patient seen and examined at bedside.  Poor historian.  No overnight seizures, vomiting, fever reported.   Objective: Vitals:   02/15/21 1153 02/15/21 1816 02/15/21 2058 02/16/21 0510  BP: 113/66 117/76 118/80 126/81  Pulse: (!) 107 96 94 83  Resp: 19 17 18 18   Temp: 98.3 F (36.8 C) 98.3 F (  36.8 C) 99 F (37.2 C) 98 F (36.7 C)  TempSrc: Oral Oral Oral  Oral  SpO2: 100% 96% 95% 95%  Weight:    40.6 kg  Height:        Intake/Output Summary (Last 24 hours) at 02/16/2021 0625 Last data filed at 02/15/2021 1831 Gross per 24 hour  Intake 780 ml  Output 0 ml  Net 780 ml    Filed Weights   02/15/21 0909 02/15/21 1105 02/16/21 0510  Weight: 37.5 kg 37.5 kg 40.6 kg    Examination:  General exam: On room air currently.  No acute distress.  Looks chronically ill and deconditioned.  Respiratory system: Bilateral decreased breath sounds at bases with scattered crackles  cardiovascular system: S1-S2 heard; currently rate controlled gastrointestinal system: Abdomen is mildly distended; soft and nontender.  Normal bowel sounds are extremities: No cyanosis; lower extremity edema present bilaterally Central nervous system: Very very slow to respond, very poor historian; no focal neurological deficits.  Moving extremities  skin: Right upper extremity dressing present.  No obvious other lesions noted  psychiatry: Affect is very flat.  Does not participate in conversation much. Rectal tube in place  Data Reviewed: I have personally reviewed following labs and imaging studies  CBC: Recent Labs  Lab 02/10/21 0709 02/13/21 0647 02/13/21 1609 02/14/21 0055 02/15/21 0229 02/16/21 0257  WBC 9.8 8.7  --   --  6.0 5.6  NEUTROABS  --  7.2  --   --  4.2 3.7  HGB 9.6* 8.0* 6.6* 8.7* 8.7* 7.7*  HCT 30.2* 26.7* 22.5* 26.6* 26.7* 23.8*  MCV 94.4 101.1*  --   --  90.5 92.2  PLT 205 268  --   --  202 749    Basic Metabolic Panel: Recent Labs  Lab 02/13/21 0647 02/14/21 0055 02/15/21 0229 02/16/21 0257  NA 139 137 136 138  K 3.7 4.2 3.2* 3.0*  CL 102 102 100 102  CO2 24 24 27 25   GLUCOSE 118* 91 95 89  BUN 32* 36* 18 12  CREATININE 6.29* 7.38* 4.52* 4.27*  CALCIUM 7.8* 8.1* 8.6* 8.8*  PHOS  --  4.7* 3.9 3.6    GFR: Estimated Creatinine Clearance: 8.1 mL/min (A) (by C-G formula based on SCr of 4.27 mg/dL (H)). Liver Function  Tests: Recent Labs  Lab 02/14/21 0055 02/15/21 0229 02/16/21 0257  ALBUMIN 2.1* 2.3* 2.2*    No results for input(s): LIPASE, AMYLASE in the last 168 hours. No results for input(s): AMMONIA in the last 168 hours. Coagulation Profile: No results for input(s): INR, PROTIME in the last 168 hours. Cardiac Enzymes: No results for input(s): CKTOTAL, CKMB, CKMBINDEX, TROPONINI in the last 168 hours. BNP (last 3 results) No results for input(s): PROBNP in the last 8760 hours. HbA1C: No results for input(s): HGBA1C in the last 72 hours. CBG: Recent Labs  Lab 02/09/21 2349 02/10/21 0408 02/10/21 0822 02/10/21 1235 02/10/21 2048  GLUCAP 127* 85 68* 83 89    Lipid Profile: No results for input(s): CHOL, HDL, LDLCALC, TRIG, CHOLHDL, LDLDIRECT in the last 72 hours. Thyroid Function Tests: No results for input(s): TSH, T4TOTAL, FREET4, T3FREE, THYROIDAB in the last 72 hours. Anemia Panel: No results for input(s): VITAMINB12, FOLATE, FERRITIN, TIBC, IRON, RETICCTPCT in the last 72 hours. Sepsis Labs: No results for input(s): PROCALCITON, LATICACIDVEN in the last 168 hours.  Recent Results (from the past 240 hour(s))  Culture, blood (Routine X 2) w Reflex to ID Panel     Status:  None   Collection Time: 02/08/21 11:34 AM   Specimen: BLOOD  Result Value Ref Range Status   Specimen Description BLOOD BLOOD LEFT HAND  Final   Special Requests   Final    BOTTLES DRAWN AEROBIC AND ANAEROBIC Blood Culture results may not be optimal due to an inadequate volume of blood received in culture bottles   Culture   Final    NO GROWTH 5 DAYS Performed at [facility], [place] [address], [place], [place] [address]    Report Status 02/13/2021 FINAL  Final  Culture, blood (Routine X 2) w Reflex to ID Panel     Status: None   Collection Time: 02/08/21 11:59 AM   Specimen: BLOOD  Result Value Ref Range Status   Specimen Description BLOOD BLOOD LEFT HAND  Final   Special Requests AEROBIC BOTTLE  ONLY Blood Culture adequate volume  Final   Culture   Final    NO GROWTH 5 DAYS Performed at [facility], [place] [address] [place], [place] [address]    Report Status 02/13/2021 FINAL  Final  Resp Panel by RT-PCR (Flu A&B, Covid) Nasopharyngeal Swab     Status: None   Collection Time: 02/10/21 12:57 PM   Specimen: Nasopharyngeal Swab; Nasopharyngeal(NP) swabs in vial transport medium  Result Value Ref Range Status   SARS Coronavirus 2 by RT PCR NEGATIVE NEGATIVE Final    Comment: (NOTE) SARS-CoV-2 target nucleic acids are NOT DETECTED.  The SARS-CoV-2 RNA is generally detectable in upper respiratory specimens during the acute phase of infection. The lowest concentration of SARS-CoV-2 viral copies this assay can detect is 138 copies/mL. A negative result does not preclude SARS-Cov-2 infection and should not be used as the sole basis for treatment or other patient management decisions. A negative result may occur with  improper specimen collection/handling, submission of specimen other than nasopharyngeal swab, presence of viral mutation(s) within the areas targeted by this assay, and inadequate number of viral copies(<138 copies/mL). A negative result must be combined with clinical observations, patient history, and epidemiological information. The expected result is Negative.  Fact Sheet for Patients:  EntrepreneurPulse.com.au  Fact Sheet for Healthcare Providers:  IncredibleEmployment.be  This test is no t yet approved or cleared by the [place] FDA and  has been authorized for detection and/or diagnosis of SARS-CoV-2 by FDA under an Emergency Use Authorization (EUA). This EUA will remain  in effect (meaning this test can be used) for the duration of the COVID-19 declaration under Section 564(b)(1) of the Act, 21 U.S.C.section 360bbb-3(b)(1), unless the authorization is terminated  or revoked sooner.       Influenza A by PCR  NEGATIVE NEGATIVE Final   Influenza B by PCR NEGATIVE NEGATIVE Final    Comment: (NOTE) The Xpert Xpress SARS-CoV-2/FLU/RSV plus assay is intended as an aid in the diagnosis of influenza from Nasopharyngeal swab specimens and should not be used as a sole basis for treatment. Nasal washings and aspirates are unacceptable for Xpert Xpress SARS-CoV-2/FLU/RSV testing.  Fact Sheet for Patients: EntrepreneurPulse.com.au  Fact Sheet for Healthcare Providers: IncredibleEmployment.be  This test is not yet approved or cleared by the [place] FDA and has been authorized for detection and/or diagnosis of SARS-CoV-2 by FDA under an Emergency Use Authorization (EUA). This EUA will remain in effect (meaning this test can be used) for the duration of the COVID-19 declaration under Section 564(b)(1) of the Act, 21 U.S.C. section 360bbb-3(b)(1), unless the authorization is terminated or revoked.  Performed at [facility], [place] [address] [place], [place] [address]   Resp Panel by RT-PCR (Flu A&B, Covid) Nasopharyngeal Swab     Status: None   Collection Time: 02/13/21  8:16 AM   Specimen: Nasopharyngeal Swab; Nasopharyngeal(NP) swabs in vial transport medium  Result Value Ref Range Status   SARS Coronavirus 2 by RT PCR NEGATIVE NEGATIVE Final    Comment: (NOTE) SARS-CoV-2 target nucleic acids are NOT DETECTED.  The SARS-CoV-2 RNA is generally detectable in upper respiratory specimens during the acute phase of infection. The lowest concentration of SARS-CoV-2 viral copies this assay can detect is 138 copies/mL. A negative result does not preclude SARS-Cov-2 infection and should not be used as the sole basis for treatment or other patient management decisions. A negative result may occur with  improper specimen collection/handling, submission of specimen other than nasopharyngeal swab, presence of viral mutation(s) within the areas targeted by this  assay, and inadequate number of viral copies(<138 copies/mL). A negative result must be combined with clinical observations, patient history, and epidemiological information. The expected result is Negative.  Fact Sheet for Patients:  EntrepreneurPulse.com.au  Fact Sheet for Healthcare Providers:  IncredibleEmployment.be  This test is no t yet approved or cleared by the [place] FDA and  has been authorized for detection and/or diagnosis of SARS-CoV-2 by FDA under an Emergency Use Authorization (EUA). This EUA will remain  in effect (meaning this test can be used) for the duration of the COVID-19 declaration under Section 564(b)(1) of the Act, 21 U.S.C.section 360bbb-3(b)(1), unless the authorization is terminated  or revoked sooner.       Influenza A by PCR NEGATIVE NEGATIVE Final   Influenza B by PCR NEGATIVE NEGATIVE Final    Comment: (NOTE) The Xpert Xpress SARS-CoV-2/FLU/RSV plus assay is intended as an aid in the diagnosis of influenza from Nasopharyngeal swab specimens and should not be used as a sole basis for treatment. Nasal washings and aspirates are unacceptable for Xpert Xpress SARS-CoV-2/FLU/RSV testing.  Fact Sheet for Patients: EntrepreneurPulse.com.au  Fact Sheet for Healthcare Providers: IncredibleEmployment.be  This test is not yet approved or cleared by the [place] FDA and has been authorized for detection and/or diagnosis of SARS-CoV-2 by FDA under an Emergency Use Authorization (EUA). This EUA will remain in effect (meaning this test can be used) for the duration of the COVID-19 declaration under Section 564(b)(1) of the Act, 21 U.S.C. section 360bbb-3(b)(1), unless the authorization is terminated or revoked.  Performed at [facility], [address], [place], [place] [address]   C Difficile Quick Screen w PCR reflex     Status: Abnormal   Collection Time: 02/14/21   4:12 PM   Specimen: STOOL  Result Value Ref Range Status   C Diff antigen POSITIVE (A) NEGATIVE Final    Comment: CRITICAL RESULT CALLED TO, READ BACK BY AND VERIFIED WITH: K. MASHORE RN 02/14/2021 @1949  [place] JW    C Diff toxin NEGATIVE NEGATIVE Final   C Diff interpretation Results are indeterminate. See PCR results.  Final    Comment: Performed at [facility], [place] [address], [place],  [address]  Gastrointestinal Panel by PCR , Stool     Status: None   Collection Time: 02/14/21  4:12 PM   Specimen: Stool  Result Value Ref Range Status   Campylobacter species NOT DETECTED NOT DETECTED Final   Plesimonas shigelloides NOT DETECTED NOT DETECTED Final   Salmonella species NOT DETECTED NOT DETECTED Final  Yersinia enterocolitica NOT DETECTED NOT DETECTED Final   Vibrio species NOT DETECTED NOT DETECTED Final   Vibrio cholerae NOT DETECTED NOT DETECTED Final   Enteroaggregative E coli (EAEC) NOT DETECTED NOT DETECTED Final   Enteropathogenic E coli (EPEC) NOT DETECTED NOT DETECTED Final   Enterotoxigenic E coli (ETEC) NOT DETECTED NOT DETECTED Final   Shiga like toxin producing E coli (STEC) NOT DETECTED NOT DETECTED Final   Shigella/Enteroinvasive E coli (EIEC) NOT DETECTED NOT DETECTED Final   Cryptosporidium NOT DETECTED NOT DETECTED Final   Cyclospora cayetanensis NOT DETECTED NOT DETECTED Final   Entamoeba histolytica NOT DETECTED NOT DETECTED Final   Giardia lamblia NOT DETECTED NOT DETECTED Final   Adenovirus F40/41 NOT DETECTED NOT DETECTED Final   Astrovirus NOT DETECTED NOT DETECTED Final   Norovirus GI/GII NOT DETECTED NOT DETECTED Final   Rotavirus A NOT DETECTED NOT DETECTED Final   Sapovirus (I, II, IV, and V) NOT DETECTED NOT DETECTED Final    Comment: Performed at [facility], [place] [place], [place] [address]  C. Diff by PCR, Reflexed     Status: None   Collection Time: 02/14/21  4:12 PM  Result Value Ref Range Status   Toxigenic C.  Difficile by PCR NEGATIVE NEGATIVE Final    Comment: Patient is colonized with non toxigenic C. difficile. May not need treatment unless significant symptoms are present. Performed at [facility], [place] [address] [place], [place] [address]           Radiology Studies: No results found.      Scheduled Meds:  amLODipine  10 mg Oral Daily   vitamin C  500 mg Oral BID   calcitRIOL  1.25 mcg Oral Q T,Th,Sa-HD   Chlorhexidine Gluconate Cloth  6 each Topical Daily   darbepoetin (ARANESP) injection - DIALYSIS  40 mcg Intravenous Q Sat-HD   dolutegravir  50 mg Oral q1600   lacosamide  150 mg Oral BID   lamiVUDine  50 mg Oral Daily   levETIRAcetam  1,000 mg Oral Daily   levETIRAcetam  250 mg Oral Q T,Th,Sat-1800   megestrol  400 mg Oral Daily   metoprolol tartrate  25 mg Oral BID   mirtazapine  15 mg Oral QHS   nystatin  5 mL Oral QID   ondansetron  4 mg Oral Q6H   pantoprazole  40 mg Oral QAC breakfast   saccharomyces boulardii  250 mg Oral BID   thiamine  100 mg Oral Daily   venlafaxine  75 mg Oral BID   zidovudine  300 mg Oral q1600   Continuous Infusions:         Aline August, MD [facility] 02/16/2021, 6:25 AM

## 2021-02-17 LAB — RENAL FUNCTION PANEL
Albumin: 2.2 g/dL — ABNORMAL LOW (ref 3.5–5.0)
Anion gap: 10 (ref 5–15)
BUN: 21 mg/dL (ref 8–23)
CO2: 24 mmol/L (ref 22–32)
Calcium: 8.6 mg/dL — ABNORMAL LOW (ref 8.9–10.3)
Chloride: 104 mmol/L (ref 98–111)
Creatinine, Ser: 6.15 mg/dL — ABNORMAL HIGH (ref 0.44–1.00)
GFR, Estimated: 7 mL/min — ABNORMAL LOW (ref >60)
Glucose, Bld: 79 mg/dL (ref 70–99)
Phosphorus: 4.1 mg/dL (ref 2.5–4.6)
Potassium: 3.4 mmol/L — ABNORMAL LOW (ref 3.5–5.1)
Sodium: 138 mmol/L (ref 135–145)

## 2021-02-17 LAB — CBC WITH DIFFERENTIAL/PLATELET
Abs Immature Granulocytes: 0.03 10*3/uL (ref 0.00–0.07)
Basophils Absolute: 0 10*3/uL (ref 0.0–0.1)
Basophils Relative: 0 %
Eosinophils Absolute: 0.1 10*3/uL (ref 0.0–0.5)
Eosinophils Relative: 1 %
HCT: 25.6 % — ABNORMAL LOW (ref 36.0–46.0)
Hemoglobin: 8 g/dL — ABNORMAL LOW (ref 12.0–15.0)
Immature Granulocytes: 1 %
Lymphocytes Relative: 20 %
Lymphs Abs: 1.3 10*3/uL (ref 0.7–4.0)
MCH: 29.6 pg (ref 26.0–34.0)
MCHC: 31.3 g/dL (ref 30.0–36.0)
MCV: 94.8 fL (ref 80.0–100.0)
Monocytes Absolute: 0.4 10*3/uL (ref 0.1–1.0)
Monocytes Relative: 7 %
Neutro Abs: 4.5 10*3/uL (ref 1.7–7.7)
Neutrophils Relative %: 71 %
Platelets: 184 10*3/uL (ref 150–400)
RBC: 2.7 MIL/uL — ABNORMAL LOW (ref 3.87–5.11)
RDW: 20.2 % — ABNORMAL HIGH (ref 11.5–15.5)
WBC: 6.4 10*3/uL (ref 4.0–10.5)
nRBC: 0 % (ref 0.0–0.2)

## 2021-02-17 MED ORDER — HEPARIN SODIUM (PORCINE) 1000 UNIT/ML DIALYSIS
1000.0000 [IU] | INTRAMUSCULAR | Status: DC | PRN
  Administered 2021-02-18: 3800 [IU] via INTRAVENOUS_CENTRAL
  Filled 2021-02-17: qty 1

## 2021-02-17 MED ORDER — ALTEPLASE 2 MG IJ SOLR: 2.0000 mg | Freq: Once | INTRAMUSCULAR | Status: DC | PRN

## 2021-02-17 MED ORDER — LIDOCAINE HCL (PF) 1 % IJ SOLN: 5.0000 mL | INTRAMUSCULAR | Status: DC | PRN

## 2021-02-17 MED ORDER — SODIUM CHLORIDE 0.9 % IV SOLN: 100.0000 mL | INTRAVENOUS | Status: DC | PRN

## 2021-02-17 MED ORDER — LIDOCAINE-PRILOCAINE 2.5-2.5 % EX CREA: 1.0000 "application " | TOPICAL_CREAM | CUTANEOUS | Status: DC | PRN

## 2021-02-17 MED ORDER — PENTAFLUOROPROP-TETRAFLUOROETH EX AERO: 1.0000 "application " | INHALATION_SPRAY | CUTANEOUS | Status: DC | PRN

## 2021-02-17 NOTE — Anesthesia Preprocedure Evaluation (Addendum)
Anesthesia Evaluation  Patient identified by MRN, date of birth, ID band Patient awake    Reviewed: Allergy & Precautions, NPO status , Patient's Chart, lab work & pertinent test results  Airway Mallampati: II  TM Distance: >3 FB Neck ROM: Full    Dental  (+) Edentulous Upper, Edentulous Lower   Pulmonary former smoker,    Pulmonary exam normal        Cardiovascular hypertension, Pt. on medications and Pt. on home beta blockers pulmonary hypertension+ CAD and +CHF  + dysrhythmias Atrial Fibrillation  Rhythm:Regular Rate:Normal  s/p 1)re-do right brachial exposure and control of hemorrhage 2)harvest right greater saphenous vein 3)right brachial-brachial bypass (end-to-end, reversed greater saphenous vein, subfascial) 4)application of skin substituteto right upper extremity(Myriad 7x10cm) 14 Days Post Op.   Neuro/Psych  Headaches, Seizures -,  PSYCHIATRIC DISORDERS Anxiety Depression TIA   GI/Hepatic Neg liver ROS, PUD, GERD  Medicated and Controlled,  Endo/Other  negative endocrine ROS  Renal/GU Dialysis and ESRFRenal disease     Musculoskeletal negative musculoskeletal ROS (+)   Abdominal   Peds  Hematology  (+) Blood dyscrasia, anemia , HIV,   Anesthesia Other Findings Right arm wound  Reproductive/Obstetrics                           Anesthesia Physical Anesthesia Plan  ASA: 4  Anesthesia Plan: General   Post-op Pain Management: Tylenol PO (pre-op)   Induction: Intravenous  PONV Risk Score and Plan: 3 and Dexamethasone, Ondansetron and Treatment may vary due to age or medical condition  Airway Management Planned: Oral ETT  Additional Equipment:   Intra-op Plan:   Post-operative Plan: Extubation in OR  Informed Consent: I have reviewed the patients History and Physical, chart, labs and discussed the procedure including the risks, benefits and alternatives for the  proposed anesthesia with the patient or authorized representative who has indicated his/her understanding and acceptance.   Patient has DNR.     Plan Discussed with: CRNA  Anesthesia Plan Comments:        Anesthesia Quick Evaluation

## 2021-02-17 NOTE — Progress Notes (Signed)
Pt receives out-pt HD at [facility] on TTS. Pt arrives at 11:00 for 11:15 chair time. Will assist as needed.  Melven Sartorius Renal Navigator [phone]

## 2021-02-17 NOTE — Progress Notes (Addendum)
Patient ID: [name], female   DOB: Jul 28, 1950, 70 y.o.   MRN: [id number]  PROGRESS NOTE    [name]  FMB:[id number] DOB: [birth year] DOA: 02/13/2021 PCP: Hilbert Corrigan, MD   Brief Narrative:  70 y.o. female with medical history significant of ESRD on HD, atrial flutter on Eliquis, pulmonary hypertension, HIV, and GERD, recent fistula infection on vancomycin treatment with HD as an outpatient, recent admission from 01/23/2021-12/12 /2022 for syncopal episode while undergoing hemodialysis; subsequently patient was noted to be hypertensive and had seizure for which MRI revealed concern for PRES requiring Cardene drip and transferred to ICU for continuous EEG monitoring.  During hospitalization patient was noted to have a pseudoaneurysm rupture of the right arm fistula taken to the OR emergently and underwent redo of the right brachial exposure and control of hemorrhage, harvest right greater saphenous vein, right brachial brachial bypass, and application of skin substitute to the right upper extremity.  Eliquis and Plavix were initially held and she required transfusion of 2 units of packed red blood cells on 12/6.  Hemoglobin had remained stable and Eliquis only was resumed on 12/7. She presented back to the ED with bleeding from fistula; on presentation in the ED, hemoglobin was 8 with subsequent hemoglobin of 6.6.  Vascular surgery and nephrology were consulted.  Assessment & Plan:   Bleeding around AV fistula wound -Patient with recent history of ruptured pseudoaneurysm status post brachial brachial bypass with ligation of the need of brachial artery on 12/5.  Vascular surgery consulted discontinued VAC, put in orders for wound care, and recommended holding anticoagulation in the acute setting. -Follow vascular surgery recommendations: Dressing changes as per vascular surgery.  Anticoagulation to remain on hold till cleared by vascular surgery.  Acute blood loss  anemia/anemia of chronic disease from renal failure -Hemoglobin 6.6 on presentation.  Status post 1 unit packed red cell transfusion; hemoglobin 8 this morning.  Monitor H&H.  End-stage renal disease on hemodialysis -Nephrology following.  Dialysis as per nephrology schedule  hypokalemia -Monitor  Atrial flutter on chronic anticoagulation -Continue metoprolol.  Currently rate controlled.  Eliquis on hold  Nausea, vomiting and diarrhea -Patient was on scheduled Senokot at the rehab.  Senokot on hold.  Monitor.  Advance diet as tolerated: Patient's oral intake has been poor - use antiemetics as needed -Still having a lot of diarrhea.  Stool for C. difficile is positive for antigen but negative for toxin or PCR: Possibly colonizer; GI panel PCR pending.  Will not treat for for C. difficile colitis since C. difficile toxin and PCR are both negative.  Use Imodium as needed  HIV  -continue current antiretroviral therapy  Essential hypertension -Blood pressure on the lower side.  Continue amlodipine and metoprolol  GERD -Continue PPI  Generalized deconditioning -Patient currently from SNF and will return back to SNF once medically improves.  Social worker following  DVT prophylaxis: SCDs Code Status: DNR Family Communication: None at bedside Disposition Plan: Status is: Inpatient because: Of need for continued wound care and further vascular surgery recommendations.  Patient still having significant diarrhea as well.  Consultants: Vascular surgery/nephrology  Procedures: None  Antimicrobials: Antiretrovirals  Subjective: Patient seen and examined at bedside.  Poor historian.  No fever, vomiting, chest pain, seizures or agitation reported by nursing staff.  Objective: Vitals:   02/16/21 0510 02/16/21 1040 02/16/21 1656 02/16/21 2004  BP: 126/81 127/78 132/77 123/81  Pulse: 83 91 81 85  Resp: 18 16 17 17   Temp: 98  F (36.7 C) 97.9 F (36.6 C) 98 F (36.7 C) 98.6 F (37 C)   TempSrc: Oral   Oral  SpO2: 95% 100% 99% 100%  Weight: 40.6 kg     Height:        Intake/Output Summary (Last 24 hours) at 02/17/2021 0726 Last data filed at 02/16/2021 1300 Gross per 24 hour  Intake 540 ml  Output 0 ml  Net 540 ml    Filed Weights   02/15/21 0909 02/15/21 1105 02/16/21 0510  Weight: 37.5 kg 37.5 kg 40.6 kg    Examination:  General exam: No distress.  Still on room air.  Looks chronically ill and deconditioned.  Respiratory system: Decreased breath sounds at bases bilaterally with some crackles  cardiovascular system: Rate controlled, S1-S2 heard gastrointestinal system: Abdomen is distended mildly; soft and nontender.  Bowel sounds heard  extremities: Bilateral lower extremity edema present; no clubbing Central nervous system: Extremely slow to respond; very poor historian; no focal neurological deficits.  Moves extremities skin: Right upper extremity dressing present.  No obvious further rash present  psychiatry: Extremely flat affect.  Hardly participates in any conversation.     Data Reviewed: I have personally reviewed following labs and imaging studies  CBC: Recent Labs  Lab 02/13/21 0647 02/13/21 1609 02/14/21 0055 02/15/21 0229 02/16/21 0257 02/17/21 0400  WBC 8.7  --   --  6.0 5.6 6.4  NEUTROABS 7.2  --   --  4.2 3.7 4.5  HGB 8.0* 6.6* 8.7* 8.7* 7.7* 8.0*  HCT 26.7* 22.5* 26.6* 26.7* 23.8* 25.6*  MCV 101.1*  --   --  90.5 92.2 94.8  PLT 268  --   --  202 193 892    Basic Metabolic Panel: Recent Labs  Lab 02/13/21 0647 02/14/21 0055 02/15/21 0229 02/16/21 0257 02/17/21 0400  NA 139 137 136 138 138  K 3.7 4.2 3.2* 3.0* 3.4*  CL 102 102 100 102 104  CO2 24 24 27 25 24   GLUCOSE 118* 91 95 89 79  BUN 32* 36* 18 12 21   CREATININE 6.29* 7.38* 4.52* 4.27* 6.15*  CALCIUM 7.8* 8.1* 8.6* 8.8* 8.6*  PHOS  --  4.7* 3.9 3.6 4.1    GFR: Estimated Creatinine Clearance: 5.6 mL/min (A) (by C-G formula based on SCr of 6.15 mg/dL  (H)). Liver Function Tests: Recent Labs  Lab 02/14/21 0055 02/15/21 0229 02/16/21 0257 02/17/21 0400  ALBUMIN 2.1* 2.3* 2.2* 2.2*    No results for input(s): LIPASE, AMYLASE in the last 168 hours. No results for input(s): AMMONIA in the last 168 hours. Coagulation Profile: No results for input(s): INR, PROTIME in the last 168 hours. Cardiac Enzymes: No results for input(s): CKTOTAL, CKMB, CKMBINDEX, TROPONINI in the last 168 hours. BNP (last 3 results) No results for input(s): PROBNP in the last 8760 hours. HbA1C: No results for input(s): HGBA1C in the last 72 hours. CBG: Recent Labs  Lab 02/10/21 0822 02/10/21 1235 02/10/21 2048 02/16/21 2134  GLUCAP 68* 83 89 92    Lipid Profile: No results for input(s): CHOL, HDL, LDLCALC, TRIG, CHOLHDL, LDLDIRECT in the last 72 hours. Thyroid Function Tests: No results for input(s): TSH, T4TOTAL, FREET4, T3FREE, THYROIDAB in the last 72 hours. Anemia Panel: No results for input(s): VITAMINB12, FOLATE, FERRITIN, TIBC, IRON, RETICCTPCT in the last 72 hours. Sepsis Labs: No results for input(s): PROCALCITON, LATICACIDVEN in the last 168 hours.  Recent Results (from the past 240 hour(s))  Culture, blood (Routine X 2) w Reflex to  ID Panel     Status: None   Collection Time: 02/08/21 11:34 AM   Specimen: BLOOD  Result Value Ref Range Status   Specimen Description BLOOD BLOOD LEFT HAND  Final   Special Requests   Final    BOTTLES DRAWN AEROBIC AND ANAEROBIC Blood Culture results may not be optimal due to an inadequate volume of blood received in culture bottles   Culture   Final    NO GROWTH 5 DAYS Performed at [facility], [place] [address] [place], [place] [address]    Report Status 02/13/2021 FINAL  Final  Culture, blood (Routine X 2) w Reflex to ID Panel     Status: None   Collection Time: 02/08/21 11:59 AM   Specimen: BLOOD  Result Value Ref Range Status   Specimen Description BLOOD BLOOD LEFT HAND  Final   Special  Requests AEROBIC BOTTLE ONLY Blood Culture adequate volume  Final   Culture   Final    NO GROWTH 5 DAYS Performed at [facility], [place] [address] [place], [place] [address]    Report Status 02/13/2021 FINAL  Final  Resp Panel by RT-PCR (Flu A&B, Covid) Nasopharyngeal Swab     Status: None   Collection Time: 02/10/21 12:57 PM   Specimen: Nasopharyngeal Swab; Nasopharyngeal(NP) swabs in vial transport medium  Result Value Ref Range Status   SARS Coronavirus 2 by RT PCR NEGATIVE NEGATIVE Final    Comment: (NOTE) SARS-CoV-2 target nucleic acids are NOT DETECTED.  The SARS-CoV-2 RNA is generally detectable in upper respiratory specimens during the acute phase of infection. The lowest concentration of SARS-CoV-2 viral copies this assay can detect is 138 copies/mL. A negative result does not preclude SARS-Cov-2 infection and should not be used as the sole basis for treatment or other patient management decisions. A negative result may occur with  improper specimen collection/handling, submission of specimen other than nasopharyngeal swab, presence of viral mutation(s) within the areas targeted by this assay, and inadequate number of viral copies(<138 copies/mL). A negative result must be combined with clinical observations, patient history, and epidemiological information. The expected result is Negative.  Fact Sheet for Patients:  EntrepreneurPulse.com.au  Fact Sheet for Healthcare Providers:  IncredibleEmployment.be  This test is no t yet approved or cleared by the [place] FDA and  has been authorized for detection and/or diagnosis of SARS-CoV-2 by FDA under an Emergency Use Authorization (EUA). This EUA will remain  in effect (meaning this test can be used) for the duration of the COVID-19 declaration under Section 564(b)(1) of the Act, 21 U.S.C.section 360bbb-3(b)(1), unless the authorization is terminated  or revoked sooner.        Influenza A by PCR NEGATIVE NEGATIVE Final   Influenza B by PCR NEGATIVE NEGATIVE Final    Comment: (NOTE) The Xpert Xpress SARS-CoV-2/FLU/RSV plus assay is intended as an aid in the diagnosis of influenza from Nasopharyngeal swab specimens and should not be used as a sole basis for treatment. Nasal washings and aspirates are unacceptable for Xpert Xpress SARS-CoV-2/FLU/RSV testing.  Fact Sheet for Patients: EntrepreneurPulse.com.au  Fact Sheet for Healthcare Providers: IncredibleEmployment.be  This test is not yet approved or cleared by the [place] FDA and has been authorized for detection and/or diagnosis of SARS-CoV-2 by FDA under an Emergency Use Authorization (EUA). This EUA will remain in effect (meaning this test can be used) for the duration of the COVID-19 declaration under Section 564(b)(1) of the Act, 21 U.S.C. section 360bbb-3(b)(1), unless  the authorization is terminated or revoked.  Performed at [facility], [place] [address], [place], [place] [address]   Resp Panel by RT-PCR (Flu A&B, Covid) Nasopharyngeal Swab     Status: None   Collection Time: 02/13/21  8:16 AM   Specimen: Nasopharyngeal Swab; Nasopharyngeal(NP) swabs in vial transport medium  Result Value Ref Range Status   SARS Coronavirus 2 by RT PCR NEGATIVE NEGATIVE Final    Comment: (NOTE) SARS-CoV-2 target nucleic acids are NOT DETECTED.  The SARS-CoV-2 RNA is generally detectable in upper respiratory specimens during the acute phase of infection. The lowest concentration of SARS-CoV-2 viral copies this assay can detect is 138 copies/mL. A negative result does not preclude SARS-Cov-2 infection and should not be used as the sole basis for treatment or other patient management decisions. A negative result may occur with  improper specimen collection/handling, submission of specimen other than nasopharyngeal swab, presence of viral mutation(s) within  the areas targeted by this assay, and inadequate number of viral copies(<138 copies/mL). A negative result must be combined with clinical observations, patient history, and epidemiological information. The expected result is Negative.  Fact Sheet for Patients:  EntrepreneurPulse.com.au  Fact Sheet for Healthcare Providers:  IncredibleEmployment.be  This test is no t yet approved or cleared by the [place] FDA and  has been authorized for detection and/or diagnosis of SARS-CoV-2 by FDA under an Emergency Use Authorization (EUA). This EUA will remain  in effect (meaning this test can be used) for the duration of the COVID-19 declaration under Section 564(b)(1) of the Act, 21 U.S.C.section 360bbb-3(b)(1), unless the authorization is terminated  or revoked sooner.       Influenza A by PCR NEGATIVE NEGATIVE Final   Influenza B by PCR NEGATIVE NEGATIVE Final    Comment: (NOTE) The Xpert Xpress SARS-CoV-2/FLU/RSV plus assay is intended as an aid in the diagnosis of influenza from Nasopharyngeal swab specimens and should not be used as a sole basis for treatment. Nasal washings and aspirates are unacceptable for Xpert Xpress SARS-CoV-2/FLU/RSV testing.  Fact Sheet for Patients: EntrepreneurPulse.com.au  Fact Sheet for Healthcare Providers: IncredibleEmployment.be  This test is not yet approved or cleared by the [place] FDA and has been authorized for detection and/or diagnosis of SARS-CoV-2 by FDA under an Emergency Use Authorization (EUA). This EUA will remain in effect (meaning this test can be used) for the duration of the COVID-19 declaration under Section 564(b)(1) of the Act, 21 U.S.C. section 360bbb-3(b)(1), unless the authorization is terminated or revoked.  Performed at [facility], [address], [place], [place] [address]   C Difficile Quick Screen w PCR reflex     Status: Abnormal    Collection Time: 02/14/21  4:12 PM   Specimen: STOOL  Result Value Ref Range Status   C Diff antigen POSITIVE (A) NEGATIVE Final    Comment: CRITICAL RESULT CALLED TO, READ BACK BY AND VERIFIED WITH: K. MASHORE RN 02/14/2021 @1949  [place] JW    C Diff toxin NEGATIVE NEGATIVE Final   C Diff interpretation Results are indeterminate. See PCR results.  Final    Comment: Performed at [facility], [place] [address], [place], [place] [address]  Gastrointestinal Panel by PCR , Stool     Status: None   Collection Time: 02/14/21  4:12 PM   Specimen: Stool  Result Value Ref Range Status   Campylobacter species NOT DETECTED NOT DETECTED Final   Plesimonas shigelloides NOT DETECTED NOT DETECTED Final   Salmonella species  NOT DETECTED NOT DETECTED Final   Yersinia enterocolitica NOT DETECTED NOT DETECTED Final   Vibrio species NOT DETECTED NOT DETECTED Final   Vibrio cholerae NOT DETECTED NOT DETECTED Final   Enteroaggregative E coli (EAEC) NOT DETECTED NOT DETECTED Final   Enteropathogenic E coli (EPEC) NOT DETECTED NOT DETECTED Final   Enterotoxigenic E coli (ETEC) NOT DETECTED NOT DETECTED Final   Shiga like toxin producing E coli (STEC) NOT DETECTED NOT DETECTED Final   Shigella/Enteroinvasive E coli (EIEC) NOT DETECTED NOT DETECTED Final   Cryptosporidium NOT DETECTED NOT DETECTED Final   Cyclospora cayetanensis NOT DETECTED NOT DETECTED Final   Entamoeba histolytica NOT DETECTED NOT DETECTED Final   Giardia lamblia NOT DETECTED NOT DETECTED Final   Adenovirus F40/41 NOT DETECTED NOT DETECTED Final   Astrovirus NOT DETECTED NOT DETECTED Final   Norovirus GI/GII NOT DETECTED NOT DETECTED Final   Rotavirus A NOT DETECTED NOT DETECTED Final   Sapovirus (I, II, IV, and V) NOT DETECTED NOT DETECTED Final    Comment: Performed at [facility], [place] [place], [place] [address]  C. Diff by PCR, Reflexed     Status: None   Collection Time: 02/14/21  4:12 PM  Result Value Ref  Range Status   Toxigenic C. Difficile by PCR NEGATIVE NEGATIVE Final    Comment: Patient is colonized with non toxigenic C. difficile. May not need treatment unless significant symptoms are present. Performed at [facility], [place] [address], [place], [place] [address]           Radiology Studies: No results found.      Scheduled Meds:  (feeding supplement) PROSource Plus  30 mL Oral BID BM   amLODipine  10 mg Oral Daily   vitamin C  500 mg Oral BID   bictegravir-emtricitabine-tenofovir AF  1 tablet Oral Daily   calcitRIOL  1.25 mcg Oral Q T,Th,Sa-HD   Chlorhexidine Gluconate Cloth  6 each Topical Daily   darbepoetin (ARANESP) injection - DIALYSIS  40 mcg Intravenous Q Sat-HD   lacosamide  150 mg Oral BID   levETIRAcetam  1,000 mg Oral Daily   levETIRAcetam  250 mg Oral Q T,Th,Sat-1800   megestrol  400 mg Oral Daily   metoprolol tartrate  25 mg Oral BID   mirtazapine  15 mg Oral QHS   nystatin  5 mL Oral QID   ondansetron  4 mg Oral Q6H   pantoprazole  40 mg Oral QAC breakfast   saccharomyces boulardii  250 mg Oral BID   thiamine  100 mg Oral Daily   venlafaxine  75 mg Oral BID   Continuous Infusions:         Aline August, MD [facility] 02/17/2021, 7:26 AM

## 2021-02-17 NOTE — Progress Notes (Addendum)
Progress Note    02/17/2021 8:19 AM * No surgery found *  Subjective:  right arm and leg sore   Vitals:   02/16/21 1656 02/16/21 2004  BP: 132/77 123/81  Pulse: 81 85  Resp: 17 17  Temp: 98 F (36.7 C) 98.6 F (37 C)  SpO2: 99% 100%   Physical Exam: Cardiac:  regular Lungs:  non labored Incisions:  right arm with turbid bloody drainage from proximal aspect of staple line as shown below. Otherwise right upper arm wounds healing well. Clean granulation tissue. No expressible purulence from fistula wond. Right thigh saphenectomy site healing well. Very small superficial separation of distal aspect of incision. No bleeding or drainage  Extremities:  well perfused and warm. 2+right radial pulse Neurologic: alert and oriented  CBC    Component Value Date/Time   WBC 6.4 02/17/2021 0400   RBC 2.70 (L) 02/17/2021 0400   HGB 8.0 (L) 02/17/2021 0400   HCT 25.6 (L) 02/17/2021 0400   PLT 184 02/17/2021 0400   MCV 94.8 02/17/2021 0400   MCH 29.6 02/17/2021 0400   MCHC 31.3 02/17/2021 0400   RDW 20.2 (H) 02/17/2021 0400   LYMPHSABS 1.3 02/17/2021 0400   MONOABS 0.4 02/17/2021 0400   EOSABS 0.1 02/17/2021 0400   BASOSABS 0.0 02/17/2021 0400    BMET    Component Value Date/Time   NA 138 02/17/2021 0400   K 3.4 (L) 02/17/2021 0400   CL 104 02/17/2021 0400   CO2 24 02/17/2021 0400   GLUCOSE 79 02/17/2021 0400   BUN 21 02/17/2021 0400   CREATININE 6.15 (H) 02/17/2021 0400   CREATININE 5.66 (H) 12/22/2019 1117   CALCIUM 8.6 (L) 02/17/2021 0400   CALCIUM 9.0 03/12/2011 0527   GFRNONAA 7 (L) 02/17/2021 0400   GFRNONAA 5 (L) 02/12/2015 1000   GFRAA 9 (L) 10/22/2019 0105   GFRAA 5 (L) 02/12/2015 1000    INR    Component Value Date/Time   INR 1.2 01/24/2021 1054     Intake/Output Summary (Last 24 hours) at 02/17/2021 0819 Last data filed at 02/16/2021 1300 Gross per 24 hour  Intake 240 ml  Output 0 ml  Net 240 ml     Assessment/Plan:  70 y.o. female is s/p 1)  re-do right brachial exposure and control of hemorrhage 2) harvest right greater saphenous vein 3) right brachial-brachial bypass (end-to-end, reversed greater saphenous vein, subfascial) 4) application of skin substitute to right upper extremity ([place] 7x10cm) 14 Days Post Op.  Right upper extremity well perfused with 2+ radial pulse Right arm proximal staple line with turbid bloody drainage Distal wound site no appreciable purulence appears to be healing well with granulation tissue Wounds redressed May possibly need washout and with continued bleeding would continue to hold Eliquis Right Thigh staples can be removed this week  Will continue to follow wound   Karoline Caldwell, PA-C [facility] [phone] 02/17/2021 8:19 AM   VASCULAR STAFF ADDENDUM: I have independently interviewed and examined the patient. I agree with the above.  OR tomorrow for right arm wound wound washout ans debridement  Please make NPO midnight.    Cassandria Santee, MD [facility] Phone Number: [phone] 02/17/2021 4:01 PM

## 2021-02-17 NOTE — TOC Progression Note (Signed)
Transition of Care [facility]) - Initial/Assessment Note    Patient Details  Name: [name] MRN: [id number] Date of Birth: 14-Mar-1950  Transition of Care [facility]) CM/SW Contact:    Milinda Antis, [place] Phone Number: 02/17/2021, 10:48 AM  Clinical Narrative:                 CSW reviewed chart to assist with d/c planning.  Patient LTC with [facility] and can return when medically ready.  Patient is not medically ready per MD notes because of the need for continued wound care and further vascular surgery recommendations and patient still having significant diarrhea.   CSW will continue to follow.  Expected Discharge Plan: Long Term Nursing Home Barriers to Discharge: Continued Medical Work up   Patient Goals and CMS Choice Patient states their goals for this hospitalization and ongoing recovery are:: She is LTC at [facility]      Expected Discharge Plan and Services Expected Discharge Plan: [place]       Living arrangements for the past 2 months: [place]                                      Prior Living Arrangements/Services Living arrangements for the past 2 months: [place] Lives with:: Facility Resident Patient language and need for interpreter reviewed:: Yes Do you feel safe going back to the place where you live?: Yes      Need for Family Participation in Patient Care: Yes (Comment) Care giver support system in place?: Yes (comment)   Criminal Activity/Legal Involvement Pertinent to Current Situation/Hospitalization: No - Comment as needed  Activities of Daily Living Home Assistive Devices/Equipment: Wheelchair ADL Screening (condition at time of admission) Patient's cognitive ability adequate to safely complete daily activities?: No Is the patient deaf or have difficulty hearing?: Yes Does the patient have difficulty seeing, even when wearing glasses/contacts?: No Does the patient have difficulty  concentrating, remembering, or making decisions?: No Patient able to express need for assistance with ADLs?: Yes Does the patient have difficulty dressing or bathing?: Yes Independently performs ADLs?: No Communication: Independent Dressing (OT): Needs assistance Is this a change from baseline?: Pre-admission baseline Grooming: Needs assistance Is this a change from baseline?: Pre-admission baseline Feeding: Independent Bathing: Needs assistance Is this a change from baseline?: Pre-admission baseline Toileting: Needs assistance Is this a change from baseline?: Pre-admission baseline In/Out Bed: Needs assistance Is this a change from baseline?: Pre-admission baseline Walks in Home: Needs assistance Is this a change from baseline?: Pre-admission baseline Does the patient have difficulty walking or climbing stairs?: Yes Weakness of Legs: Both Weakness of Arms/Hands: Both  Permission Sought/Granted Permission sought to share information with : Chartered certified accountant granted to share information with : Yes, Verbal Permission Granted              Emotional Assessment       Orientation: : Oriented to Situation, Oriented to  Time, Oriented to Place, Oriented to Self Alcohol / Substance Use: Not Applicable Psych Involvement: No (comment)  Admission diagnosis:  Anemia, blood loss [D50.0] End-stage renal disease on hemodialysis ([place]) [N18.6, Z99.2] Bleeding from wound [T14.8XXA] Acute blood loss anemia [D62] Patient Active Problem List   Diagnosis Date Noted   Bleeding from wound 02/13/2021   Acute blood loss anemia 02/13/2021   Thrombocytopenia ([place]) 01/30/2021   Hyponatremia 01/30/2021  Acute delirium    Hyperthyroidism 01/25/2021   Seizure ([place]) 01/25/2021   MRSA infection 01/25/2021   PRES (posterior reversible encephalopathy syndrome)    Hypertensive emergency    Hypoglycemia 12/25/2020   Infected fluid collection with fistula 12/24/2020    Dysphagia 12/16/2020   Loss of weight 12/16/2020   Abdominal pain, epigastric 12/16/2020   Protein-calorie malnutrition, severe 11/20/2020   Chest pain of uncertain etiology    Altered mental status    ESRD (end stage renal disease) ([place]) 11/11/2020   Pancreatic abnormality 08/05/2020   Gastritis without bleeding 08/05/2020   Dilated pancreatic duct 05/15/2020   Abnormal MRI of abdomen 05/15/2020   Common bile duct dilation    Lobar pneumonia ([place]) 07/20/2019   Acute respiratory failure with hypoxia ([place]) 07/20/2019   Colitis 07/19/2019   Dilation of common bile duct 07/19/2019   Ventral hernia without obstruction or gangrene    Hypocalcemia 03/07/2019   Other pancytopenia ([place]) 12/08/2018   Macrocytic anemia 04/17/2016   Hepatitis B immune 01/28/2016   Atrial flutter ([place]) 02/22/2015   Hyperkalemia 02/20/2015   Venous stenosis of right upper extremity 04/06/2014   ESRD on dialysis ([place]) 04/06/2014   Essential hypertension    Nausea, vomiting, and diarrhea 12/15/2012   CHF, chronic ([place]) 11/25/2012   Coronary artery disease 09/15/2012   History of stroke 09/15/2012   Anxiety 09/15/2012   Atrial fibrillation ([place]) 05/02/2012   Goiter 04/08/2012   Intracranial bleed ([place]) 12/28/2011   Angioedema of lips 09/21/2011   Cellulitis of breast 03/09/2011   Erosive esophagitis 12/15/2010   Mallory - Weiss tear 12/15/2010   UGI bleed 12/13/2010   DIARRHEA 04/12/2009   Human immunodeficiency virus (HIV) disease ([place]) 11/15/2008   PANCREATITIS, HX OF 11/15/2008   TOBACCO USE, QUIT 11/15/2008   PAIN IN JOINT, UPPER ARM 08/07/2008   ABDOMINAL WALL HERNIA 04/26/2007   BRONCHITIS, ACUTE 11/16/2006   HYPOKALEMIA, HX OF 07/13/2006   ANEMIA, NORMOCYTIC, CHRONIC 03/23/2006   DEPRESSION 03/23/2006   Gastroesophageal reflux disease 03/23/2006   PANCREATITIS 03/23/2006   MASS, RIGHT AXILLA 03/23/2006   HEADACHE 03/23/2006   SYMPTOM, ENLARGEMENT, LYMPH NODES 03/23/2006   SHINGLES, HX OF  03/23/2006   HYSTERECTOMY, TOTAL, HX OF 03/23/2006   PCP:  Hilbert Corrigan, MD Pharmacy:   [place], [place] [place] [address] Phone: [phone] Fax: [phone]     Social Determinants of Health (SDOH) Interventions    Readmission Risk Interventions Readmission Risk Prevention Plan 10/22/2019  Transportation Screening Complete  PCP or Specialist Appt within 3-5 Days Not Complete  Not Complete comments SNF MD to follow  [place] or [place] Not Complete  HRI or Home Care Consult comments Pt resides in a SNF  Social Work Consult for [place] Planning/Counseling Complete  Palliative Care Screening Not Applicable  Medication Review Press photographer) Complete  Some recent data might be hidden

## 2021-02-17 NOTE — Progress Notes (Addendum)
[facility] Progress Note   Subjective:    Seen and examined patient at bedside. Reports generalized pain RLE. Denies SOB, CP, and N/V. Plan for HD 02/18/21.  Objective Vitals:   02/16/21 1040 02/16/21 1656 02/16/21 2004 02/17/21 0848  BP: 127/78 132/77 123/81 138/73  Pulse: 91 81 85 87  Resp: 16 17 17 18   Temp: 97.9 F (36.6 C) 98 F (36.7 C) 98.6 F (37 C) 98.8 F (37.1 C)  TempSrc:   Oral   SpO2: 100% 99% 100% 100%  Weight:      Height:       Physical Exam General: Ill-appearing; NAD Heart: Normal S1 and S2; No murmurs, gallops, or rubs Lungs: Clear throughout; No wheezing, rales, or rhonchi Abdomen: Soft, non-tender, active bowel sounds Extremities: No edema BLLE; Noted ACE wrap RUE Dialysis Access: L IJ [facility]   Filed Weights   02/15/21 0909 02/15/21 1105 02/16/21 0510  Weight: 37.5 kg 37.5 kg 40.6 kg    Intake/Output Summary (Last 24 hours) at 02/17/2021 1018 Last data filed at 02/17/2021 0848 [place] per 24 hour  Intake 480 ml  Output 0 ml  Net 480 ml    Additional Objective Labs: Basic Metabolic Panel: Recent Labs  Lab 02/15/21 0229 02/16/21 0257 02/17/21 0400  NA 136 138 138  K 3.2* 3.0* 3.4*  CL 100 102 104  CO2 27 25 24   GLUCOSE 95 89 79  BUN 18 12 21   CREATININE 4.52* 4.27* 6.15*  CALCIUM 8.6* 8.8* 8.6*  PHOS 3.9 3.6 4.1   Liver Function Tests: Recent Labs  Lab 02/15/21 0229 02/16/21 0257 02/17/21 0400  ALBUMIN 2.3* 2.2* 2.2*   No results for input(s): LIPASE, AMYLASE in the last 168 hours. CBC: Recent Labs  Lab 02/13/21 0647 02/13/21 1609 02/15/21 0229 02/16/21 0257 02/17/21 0400  WBC 8.7  --  6.0 5.6 6.4  NEUTROABS 7.2  --  4.2 3.7 4.5  HGB 8.0*   < > 8.7* 7.7* 8.0*  HCT 26.7*   < > 26.7* 23.8* 25.6*  MCV 101.1*  --  90.5 92.2 94.8  PLT 268  --  202 193 184   < > = values in this interval not displayed.   Blood Culture    Component Value Date/Time   SDES BLOOD BLOOD LEFT HAND 02/08/2021 1159    SPECREQUEST AEROBIC BOTTLE ONLY Blood Culture adequate volume 02/08/2021 1159   CULT  02/08/2021 1159    NO GROWTH 5 DAYS Performed at [facility], [place] [address] [place], [place] [address]    REPTSTATUS 02/13/2021 FINAL 02/08/2021 1159    Cardiac Enzymes: No results for input(s): CKTOTAL, CKMB, CKMBINDEX, TROPONINI in the last 168 hours. CBG: Recent Labs  Lab 02/10/21 1235 02/10/21 2048 02/16/21 2134  GLUCAP 83 89 92   Iron Studies: No results for input(s): IRON, TIBC, TRANSFERRIN, FERRITIN in the last 72 hours. Lab Results  Component Value Date   INR 1.2 01/24/2021   INR 1.14 12/28/2011   INR 1.10 12/13/2010   Studies/Results: No results found.  Medications:   (feeding supplement) PROSource Plus  30 mL Oral BID BM   amLODipine  10 mg Oral Daily   vitamin C  500 mg Oral BID   bictegravir-emtricitabine-tenofovir AF  1 tablet Oral Daily   calcitRIOL  1.25 mcg Oral Q T,Th,Sa-HD   Chlorhexidine Gluconate Cloth  6 each Topical Daily   darbepoetin (ARANESP) injection - DIALYSIS  40 mcg Intravenous Q Sat-HD   lacosamide  150  mg Oral BID   levETIRAcetam  1,000 mg Oral Daily   levETIRAcetam  250 mg Oral Q T,Th,Sat-1800   megestrol  400 mg Oral Daily   metoprolol tartrate  25 mg Oral BID   mirtazapine  15 mg Oral QHS   nystatin  5 mL Oral QID   ondansetron  4 mg Oral Q6H   pantoprazole  40 mg Oral QAC breakfast   saccharomyces boulardii  250 mg Oral BID   thiamine  100 mg Oral Daily   venlafaxine  75 mg Oral BID    Dialysis Orders: [name] TTS,EDW 41.5 kg,Bath 2K/2.5 ca,BF 350  DF 500,Time - 180 minutes Access catheter  Medications - out of hectorol and now on calcitriol - 1.25 mcg each tx. Sensipar 90 mg three times a week; Epogen 3600 units each tx, Heparin 1000 units bolus then 500 units per hour Has been on vanc 500 mg each tx (though misses often)  Assessment/Plan: 1. Bleeding from surgical wound. 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  Per Vascular. Seen by [name] -VVS following for wound care. According to last note, may possible need washout. Continue to hold Eliquis. 2. ESRD - Usual HD TTS. Back on schedule. Next HD 12/20. No heparin . Added K+ bath. Patient under her EDW-will need to be lowered at discharge. 3. Hypokalemia -in the setting of ongoing diarrhea. K+ now 3.4. Will place on 3K bath. 4. HTN/volume- BP/volume stable. May be getting hypovolemic from diarrhea.  5.  Anemia-  Hgb fluctuating-now 8.0. Transfused 1 unit prbcs on 12/16.  Aranesp 40 q week starting 12/17.  Follow trends. Will raise if no improvement is noted. 6. MBD - Continue calcitriol/binders. Hold Sensipar for now d/t GI upset . Follow Ca trends. Ca/Phos ok.  7. Diarrhea - C. diff PCR negative. Now with rectal tube. Per primary  8. Nutrition - Liberalize diet as tolerated. Add prot supp for low albumin   Tobie Poet, NP [facility] 02/17/2021,10:18 AM  LOS: 3 days    I have seen and examined this patient and agree with plan and assessment in the above note with renal recommendations/intervention highlighted.  Broadus John A Bernon Arviso,MD 02/17/2021 1:54 PM

## 2021-02-18 ENCOUNTER — Encounter (HOSPITAL_COMMUNITY)
Admission: EM | Disposition: A | Discharge status: Skilled Nursing Facility | Payer: Self-pay | Source: Skilled Nursing Facility | Attending: Internal Medicine

## 2021-02-18 ENCOUNTER — Other Ambulatory Visit: Payer: Self-pay

## 2021-02-18 ENCOUNTER — Inpatient Hospital Stay (HOSPITAL_COMMUNITY): Payer: Medicare Other | Admitting: Anesthesiology

## 2021-02-18 ENCOUNTER — Encounter (HOSPITAL_COMMUNITY): Payer: Self-pay | Admitting: Internal Medicine

## 2021-02-18 ENCOUNTER — Telehealth: Payer: Self-pay | Admitting: Internal Medicine

## 2021-02-18 DIAGNOSIS — T8142XA Infection following a procedure, deep incisional surgical site, initial encounter: Secondary | ICD-10-CM

## 2021-02-18 HISTORY — PX: INCISION AND DRAINAGE OF WOUND: SHX1803

## 2021-02-18 LAB — RENAL FUNCTION PANEL
Albumin: 2.1 g/dL — ABNORMAL LOW (ref 3.5–5.0)
Anion gap: 11 (ref 5–15)
BUN: 29 mg/dL — ABNORMAL HIGH (ref 8–23)
CO2: 24 mmol/L (ref 22–32)
Calcium: 8.6 mg/dL — ABNORMAL LOW (ref 8.9–10.3)
Chloride: 101 mmol/L (ref 98–111)
Creatinine, Ser: 7.66 mg/dL — ABNORMAL HIGH (ref 0.44–1.00)
GFR, Estimated: 5 mL/min — ABNORMAL LOW (ref >60)
Glucose, Bld: 81 mg/dL (ref 70–99)
Phosphorus: 5.6 mg/dL — ABNORMAL HIGH (ref 2.5–4.6)
Potassium: 3.4 mmol/L — ABNORMAL LOW (ref 3.5–5.1)
Sodium: 136 mmol/L (ref 135–145)

## 2021-02-18 LAB — CBC WITH DIFFERENTIAL/PLATELET
Abs Immature Granulocytes: 0.02 10*3/uL (ref 0.00–0.07)
Basophils Absolute: 0 10*3/uL (ref 0.0–0.1)
Basophils Relative: 0 %
Eosinophils Absolute: 0.1 10*3/uL (ref 0.0–0.5)
Eosinophils Relative: 1 %
HCT: 24.1 % — ABNORMAL LOW (ref 36.0–46.0)
Hemoglobin: 7.6 g/dL — ABNORMAL LOW (ref 12.0–15.0)
Immature Granulocytes: 0 %
Lymphocytes Relative: 23 %
Lymphs Abs: 1.1 10*3/uL (ref 0.7–4.0)
MCH: 29.6 pg (ref 26.0–34.0)
MCHC: 31.5 g/dL (ref 30.0–36.0)
MCV: 93.8 fL (ref 80.0–100.0)
Monocytes Absolute: 0.4 10*3/uL (ref 0.1–1.0)
Monocytes Relative: 8 %
Neutro Abs: 3.1 10*3/uL (ref 1.7–7.7)
Neutrophils Relative %: 68 %
Platelets: 176 10*3/uL (ref 150–400)
RBC: 2.57 MIL/uL — ABNORMAL LOW (ref 3.87–5.11)
RDW: 20 % — ABNORMAL HIGH (ref 11.5–15.5)
WBC: 4.6 10*3/uL (ref 4.0–10.5)
nRBC: 0 % (ref 0.0–0.2)

## 2021-02-18 LAB — POCT I-STAT, CHEM 8
BUN: 6 mg/dL — ABNORMAL LOW (ref 8–23)
Calcium, Ion: 1.14 mmol/L — ABNORMAL LOW (ref 1.15–1.40)
Chloride: 97 mmol/L — ABNORMAL LOW (ref 98–111)
Creatinine, Ser: 2.7 mg/dL — ABNORMAL HIGH (ref 0.44–1.00)
Glucose, Bld: 81 mg/dL (ref 70–99)
HCT: 29 % — ABNORMAL LOW (ref 36.0–46.0)
Hemoglobin: 9.9 g/dL — ABNORMAL LOW (ref 12.0–15.0)
Potassium: 3.3 mmol/L — ABNORMAL LOW (ref 3.5–5.1)
Sodium: 136 mmol/L (ref 135–145)
TCO2: 27 mmol/L (ref 22–32)

## 2021-02-18 SURGERY — IRRIGATION AND DEBRIDEMENT WOUND
Anesthesia: General | Site: Arm Upper | Laterality: Right

## 2021-02-18 MED ORDER — HEPARIN SODIUM (PORCINE) 1000 UNIT/ML DIALYSIS
1000.0000 [IU] | INTRAMUSCULAR | Status: DC | PRN
  Administered 2021-02-20: 1000 [IU] via INTRAVENOUS_CENTRAL
  Filled 2021-02-18: qty 1

## 2021-02-18 MED ORDER — FENTANYL CITRATE (PF) 100 MCG/2ML IJ SOLN
25.0000 ug | INTRAMUSCULAR | Status: DC | PRN
  Administered 2021-02-18: 14:00:00 25 ug via INTRAVENOUS

## 2021-02-18 MED ORDER — DEXAMETHASONE SODIUM PHOSPHATE 10 MG/ML IJ SOLN
INTRAMUSCULAR | Status: AC
  Filled 2021-02-18: qty 3

## 2021-02-18 MED ORDER — FENTANYL CITRATE (PF) 250 MCG/5ML IJ SOLN
INTRAMUSCULAR | Status: AC
  Filled 2021-02-18: qty 5

## 2021-02-18 MED ORDER — LIDOCAINE 2% (20 MG/ML) 5 ML SYRINGE
INTRAMUSCULAR | Status: AC
  Filled 2021-02-18: qty 15

## 2021-02-18 MED ORDER — HYDROMORPHONE HCL 1 MG/ML IJ SOLN
0.5000 mg | INTRAMUSCULAR | Status: DC | PRN
  Administered 2021-02-20: 0.5 mg via INTRAVENOUS
  Filled 2021-02-18: qty 0.5

## 2021-02-18 MED ORDER — ALTEPLASE 2 MG IJ SOLR: 2.0000 mg | Freq: Once | INTRAMUSCULAR | Status: DC | PRN

## 2021-02-18 MED ORDER — LIDOCAINE HCL (PF) 1 % IJ SOLN: 5.0000 mL | INTRAMUSCULAR | Status: DC | PRN

## 2021-02-18 MED ORDER — PHENYLEPHRINE 40 MCG/ML (10ML) SYRINGE FOR IV PUSH (FOR BLOOD PRESSURE SUPPORT)
PREFILLED_SYRINGE | INTRAVENOUS | Status: AC
  Filled 2021-02-18: qty 30

## 2021-02-18 MED ORDER — FENTANYL CITRATE (PF) 250 MCG/5ML IJ SOLN
INTRAMUSCULAR | Status: DC | PRN
  Administered 2021-02-18: 150 ug via INTRAVENOUS
  Administered 2021-02-18: 25 ug via INTRAVENOUS

## 2021-02-18 MED ORDER — ONDANSETRON HCL 4 MG/2ML IJ SOLN
INTRAMUSCULAR | Status: DC | PRN
  Administered 2021-02-18: 4 mg via INTRAVENOUS

## 2021-02-18 MED ORDER — SODIUM CHLORIDE 0.9 % IV SOLN: 100.0000 mL | INTRAVENOUS | Status: DC | PRN

## 2021-02-18 MED ORDER — FENTANYL CITRATE (PF) 100 MCG/2ML IJ SOLN
INTRAMUSCULAR | Status: AC
  Filled 2021-02-18: qty 2

## 2021-02-18 MED ORDER — HEPARIN SODIUM (PORCINE) 1000 UNIT/ML DIALYSIS: 1000.0000 [IU] | INTRAMUSCULAR | Status: DC | PRN

## 2021-02-18 MED ORDER — ACETAMINOPHEN 500 MG PO TABS
1000.0000 mg | ORAL_TABLET | Freq: Once | ORAL | Status: DC
  Filled 2021-02-18: qty 2

## 2021-02-18 MED ORDER — SUCCINYLCHOLINE CHLORIDE 200 MG/10ML IV SOSY
PREFILLED_SYRINGE | INTRAVENOUS | Status: AC
  Filled 2021-02-18: qty 10

## 2021-02-18 MED ORDER — 0.9 % SODIUM CHLORIDE (POUR BTL) OPTIME
TOPICAL | Status: DC | PRN
  Administered 2021-02-18: 12:00:00 1000 mL

## 2021-02-18 MED ORDER — LIDOCAINE-PRILOCAINE 2.5-2.5 % EX CREA: 1.0000 "application " | TOPICAL_CREAM | CUTANEOUS | Status: DC | PRN

## 2021-02-18 MED ORDER — METOPROLOL TARTRATE 12.5 MG HALF TABLET
ORAL_TABLET | ORAL | Status: AC
  Filled 2021-02-18: qty 2

## 2021-02-18 MED ORDER — PHENYLEPHRINE HCL (PRESSORS) 10 MG/ML IV SOLN
INTRAVENOUS | Status: DC | PRN
  Administered 2021-02-18: 80 ug via INTRAVENOUS

## 2021-02-18 MED ORDER — LIDOCAINE 2% (20 MG/ML) 5 ML SYRINGE
INTRAMUSCULAR | Status: DC | PRN
  Administered 2021-02-18: 40 mg via INTRAVENOUS

## 2021-02-18 MED ORDER — ONDANSETRON HCL 4 MG/2ML IJ SOLN
INTRAMUSCULAR | Status: AC
  Filled 2021-02-18: qty 6

## 2021-02-18 MED ORDER — SODIUM CHLORIDE 0.9 % IV SOLN: INTRAVENOUS | Status: DC

## 2021-02-18 MED ORDER — LIDOCAINE 2% (20 MG/ML) 5 ML SYRINGE
INTRAMUSCULAR | Status: AC
  Filled 2021-02-18: qty 5

## 2021-02-18 MED ORDER — ORAL CARE MOUTH RINSE: 15.0000 mL | Freq: Once | OROMUCOSAL | Status: AC

## 2021-02-18 MED ORDER — CHLORHEXIDINE GLUCONATE 0.12 % MT SOLN
OROMUCOSAL | Status: AC
  Administered 2021-02-18: 12:00:00 15 mL via OROMUCOSAL
  Filled 2021-02-18: qty 15

## 2021-02-18 MED ORDER — PROPOFOL 10 MG/ML IV BOLUS
INTRAVENOUS | Status: AC
  Filled 2021-02-18: qty 20

## 2021-02-18 MED ORDER — PHENYLEPHRINE HCL-NACL 20-0.9 MG/250ML-% IV SOLN
INTRAVENOUS | Status: DC | PRN
Start: 2021-02-18 — End: 2021-02-18
  Administered 2021-02-18: 25 ug/min via INTRAVENOUS

## 2021-02-18 MED ORDER — SUCCINYLCHOLINE CHLORIDE 200 MG/10ML IV SOSY
PREFILLED_SYRINGE | INTRAVENOUS | Status: DC | PRN
  Administered 2021-02-18: 40 mg via INTRAVENOUS

## 2021-02-18 MED ORDER — DEXAMETHASONE SODIUM PHOSPHATE 10 MG/ML IJ SOLN
INTRAMUSCULAR | Status: DC | PRN
  Administered 2021-02-18: 4 mg via INTRAVENOUS

## 2021-02-18 MED ORDER — OXYCODONE-ACETAMINOPHEN 5-325 MG PO TABS
1.0000 | ORAL_TABLET | ORAL | Status: DC | PRN
Start: 2021-02-18 — End: 2021-02-21
  Administered 2021-02-18 – 2021-02-20 (×2): 2 via ORAL
  Filled 2021-02-18 (×2): qty 2

## 2021-02-18 MED ORDER — ACETAMINOPHEN 10 MG/ML IV SOLN: 550.0000 mg | Freq: Once | INTRAVENOUS | Status: DC | PRN

## 2021-02-18 MED ORDER — HEPARIN SODIUM (PORCINE) 1000 UNIT/ML DIALYSIS: 20.0000 [IU]/kg | INTRAMUSCULAR | Status: DC | PRN

## 2021-02-18 MED ORDER — ONDANSETRON HCL 4 MG/2ML IJ SOLN
INTRAMUSCULAR | Status: AC
  Filled 2021-02-18: qty 2

## 2021-02-18 MED ORDER — PENTAFLUOROPROP-TETRAFLUOROETH EX AERO: 1.0000 "application " | INHALATION_SPRAY | CUTANEOUS | Status: DC | PRN

## 2021-02-18 MED ORDER — PROPOFOL 10 MG/ML IV BOLUS
INTRAVENOUS | Status: DC | PRN
  Administered 2021-02-18: 100 mg via INTRAVENOUS

## 2021-02-18 MED ORDER — CHLORHEXIDINE GLUCONATE 0.12 % MT SOLN: 15.0000 mL | Freq: Once | OROMUCOSAL | Status: AC

## 2021-02-18 MED ORDER — ONDANSETRON HCL 4 MG/2ML IJ SOLN: 4.0000 mg | Freq: Once | INTRAMUSCULAR | Status: DC | PRN

## 2021-02-18 SURGICAL SUPPLY — 33 items
BAG COUNTER SPONGE SURGICOUNT (BAG) ×2 IMPLANT
BAG SURGICOUNT SPONGE COUNTING (BAG) ×1
BNDG ELASTIC 4X5.8 VLCR STR LF (GAUZE/BANDAGES/DRESSINGS) ×2 IMPLANT
BNDG ELASTIC 6X5.8 VLCR STR LF (GAUZE/BANDAGES/DRESSINGS) IMPLANT
BNDG GAUZE ELAST 4 BULKY (GAUZE/BANDAGES/DRESSINGS) ×2 IMPLANT
CANISTER SUCT 3000ML PPV (MISCELLANEOUS) ×3 IMPLANT
CLIP LIGATING EXTRA MED SLVR (CLIP) ×1 IMPLANT
CLIP LIGATING EXTRA SM BLUE (MISCELLANEOUS) ×1 IMPLANT
ELECT REM PT RETURN 9FT ADLT (ELECTROSURGICAL) ×3
ELECTRODE REM PT RTRN 9FT ADLT (ELECTROSURGICAL) ×1 IMPLANT
GAUZE SPONGE 4X4 12PLY STRL (GAUZE/BANDAGES/DRESSINGS) ×1 IMPLANT
GAUZE XEROFORM 5X9 LF (GAUZE/BANDAGES/DRESSINGS) IMPLANT
GLOVE SURG ENC MOIS LTX SZ7.5 (GLOVE) ×1 IMPLANT
GOWN STRL REUS W/ TWL LRG LVL3 (GOWN DISPOSABLE) ×2 IMPLANT
GOWN STRL REUS W/ TWL XL LVL3 (GOWN DISPOSABLE) ×1 IMPLANT
GOWN STRL REUS W/TWL LRG LVL3 (GOWN DISPOSABLE) ×6
GOWN STRL REUS W/TWL XL LVL3 (GOWN DISPOSABLE) ×3
HANDPIECE INTERPULSE COAX TIP (DISPOSABLE)
IV NS IRRIG 3000ML ARTHROMATIC (IV SOLUTION) ×3 IMPLANT
KIT BASIN OR (CUSTOM PROCEDURE TRAY) ×3 IMPLANT
KIT TURNOVER KIT B (KITS) ×3 IMPLANT
NS IRRIG 1000ML POUR BTL (IV SOLUTION) ×3 IMPLANT
PACK CV ACCESS (CUSTOM PROCEDURE TRAY) ×2 IMPLANT
PAD ABD 8X10 STRL (GAUZE/BANDAGES/DRESSINGS) ×2 IMPLANT
PAD ARMBOARD 7.5X6 YLW CONV (MISCELLANEOUS) ×6 IMPLANT
SET HNDPC FAN SPRY TIP SCT (DISPOSABLE) IMPLANT
SUT ETHILON 3 0 PS 1 (SUTURE) IMPLANT
SUT VIC AB 2-0 CTX 36 (SUTURE) IMPLANT
SUT VIC AB 3-0 SH 27 (SUTURE)
SUT VIC AB 3-0 SH 27X BRD (SUTURE) IMPLANT
SUT VICRYL 4-0 PS2 18IN ABS (SUTURE) IMPLANT
TOWEL GREEN STERILE (TOWEL DISPOSABLE) ×3 IMPLANT
WATER STERILE IRR 1000ML POUR (IV SOLUTION) ×3 IMPLANT

## 2021-02-18 NOTE — Procedures (Signed)
I was present at this dialysis session. I have reviewed the session itself and made appropriate changes.   Vital signs in last 24 hours:  Temp:  [98 F (36.7 C)-99.1 F (37.3 C)] 99.1 F (37.3 C) (12/20 0700) Pulse Rate:  [69-87] 72 (12/20 0800) Resp:  [16-20] 16 (12/20 0700) BP: (119-138)/(69-82) 119/73 (12/20 0800) SpO2:  [98 %-100 %] 100 % (12/20 0700) Weight:  [38.9 kg] 38.9 kg (12/20 0700) Weight change:  Filed Weights   02/15/21 1105 02/16/21 0510 02/18/21 0700  Weight: 37.5 kg 40.6 kg 38.9 kg    Recent Labs  Lab 02/18/21 0214  NA 136  K 3.4*  CL 101  CO2 24  GLUCOSE 81  BUN 29*  CREATININE 7.66*  CALCIUM 8.6*  PHOS 5.6*    Recent Labs  Lab 02/16/21 0257 02/17/21 0400 02/18/21 0214  WBC 5.6 6.4 4.6  NEUTROABS 3.7 4.5 3.1  HGB 7.7* 8.0* 7.6*  HCT 23.8* 25.6* 24.1*  MCV 92.2 94.8 93.8  PLT 193 184 176    Scheduled Meds:  (feeding supplement) PROSource Plus  30 mL Oral BID BM   acetaminophen  1,000 mg Oral Once   amLODipine  10 mg Oral Daily   vitamin C  500 mg Oral BID   bictegravir-emtricitabine-tenofovir AF  1 tablet Oral Daily   calcitRIOL  1.25 mcg Oral Q T,Th,Sa-HD   Chlorhexidine Gluconate Cloth  6 each Topical Daily   darbepoetin (ARANESP) injection - DIALYSIS  40 mcg Intravenous Q Sat-HD   lacosamide  150 mg Oral BID   levETIRAcetam  1,000 mg Oral Daily   levETIRAcetam  250 mg Oral Q T,Th,Sat-1800   megestrol  400 mg Oral Daily   metoprolol tartrate  25 mg Oral BID   mirtazapine  15 mg Oral QHS   nystatin  5 mL Oral QID   ondansetron  4 mg Oral Q6H   pantoprazole  40 mg Oral QAC breakfast   saccharomyces boulardii  250 mg Oral BID   thiamine  100 mg Oral Daily   venlafaxine  75 mg Oral BID   Continuous Infusions:  sodium chloride     sodium chloride     PRN Meds:.sodium chloride, sodium chloride, acetaminophen **OR** acetaminophen, albuterol, alteplase, cetaphil, heparin, lidocaine (PF), lidocaine-prilocaine, loperamide,  metoCLOPramide (REGLAN) injection, oxyCODONE-acetaminophen, pentafluoroprop-tetrafluoroeth   Donetta Potts,  MD 02/18/2021, 8:06 AM

## 2021-02-18 NOTE — Anesthesia Procedure Notes (Signed)
Procedure Name: Intubation Date/Time: 02/18/2021 12:38 PM Performed by: Clearnce Sorrel, CRNA Pre-anesthesia Checklist: Patient identified, Emergency Drugs available, Suction available and Patient being monitored Patient Re-evaluated:Patient Re-evaluated prior to induction Oxygen Delivery Method: Circle System Utilized Preoxygenation: Pre-oxygenation with 100% oxygen Induction Type: IV induction Ventilation: Mask ventilation without difficulty Laryngoscope Size: Mac and 3 Grade View: Grade I Tube type: Oral Tube size: 7.0 mm Number of attempts: 1 Airway Equipment and Method: Stylet and Oral airway Placement Confirmation: ETT inserted through vocal cords under direct vision, positive ETCO2 and breath sounds checked- equal and bilateral Secured at: 22 cm Tube secured with: Tape Dental Injury: Teeth and Oropharynx as per pre-operative assessment

## 2021-02-18 NOTE — Anesthesia Postprocedure Evaluation (Signed)
Anesthesia Post Note  Patient: [name]  Procedure(s) Performed: RIGHT ARM WASHOUT AND DEBRIDEMENT (Right: Arm Upper)     Patient location during evaluation: PACU Anesthesia Type: General Level of consciousness: awake Pain management: pain level controlled Vital Signs Assessment: post-procedure vital signs reviewed and stable Respiratory status: spontaneous breathing, nonlabored ventilation, respiratory function stable and patient connected to nasal cannula oxygen Cardiovascular status: blood pressure returned to baseline and stable Postop Assessment: no apparent nausea or vomiting Anesthetic complications: no   No notable events documented.  Last Vitals:  Vitals:   02/18/21 1432 02/18/21 1820  BP: (!) 144/97 (!) 145/93  Pulse: 68 68  Resp: 18 18  Temp: (!) 36.3 C 36.4 C  SpO2: 99% 98%    Last Pain:  Vitals:   02/18/21 1432  TempSrc: Oral  PainSc:                  Nikitia Asbill P Toia Micale

## 2021-02-18 NOTE — Op Note (Signed)
° ° °  Patient name: [name] MRN: [id number] DOB: Oct 25, 1950 Sex: female  02/18/2021 Pre-operative Diagnosis: esrd, right arm per surgical wound Post-operative diagnosis:  Same Surgeon:  Eda Paschal. Donzetta Matters, MD Assistant: Arlee Muslim, PA Procedure Performed:  I&D of right arm postsurgical wound  Indications: 70 year old female with previous access in her right upper extremity.  This was removed and her brachial artery was ligated proximally distally and the bypass was placed.  She now has purulence draining from the proximal incision through the staples.  She is indicated for I&D.  An assistant was necessary to facilitate exposure expedite the case.  Findings: There was turbid appearing fluid with hematoma in the proximal incision.  All staples were removed this was thoroughly washed out.  Distally the incision was intact with staples there was some tracking into the previous wound bed where there was granulation tissue.  The suture was removed from the wound bed there.  All wounds were packed cultures were sent.   Procedure:  The patient was identified in the holding area and taken to the operating room where she is placed upon upper table and general anesthesia induced.  She was sterilely prepped and draped proper extremity usual fashion, antibiotics were minister timeout was called.  I began by removing the staples in the proximal incision and this was easily opened.  Turbid appearing fluid was removed with hematoma and cultures were sent.  We thoroughly irrigated this wound.  I removed the staples from the distal incision.  This did have some tracking into the wound with granulation tissue and this was thoroughly irrigated.  Incisions were then packed with wet-to-dry Kerlix and wrapped with Ace.  She was awakened from anesthesia having tolerated procedure well without immediate complication.  EBL: 50cc  Jaline Pincock C. Donzetta Matters, MD [facility] Office:  [phone] Pager: [phone]

## 2021-02-18 NOTE — Transfer of Care (Signed)
Immediate Anesthesia Transfer of Care Note  Patient: [name]  Procedure(s) Performed: RIGHT ARM WASHOUT AND DEBRIDEMENT (Right: Arm Upper)  Patient Location: PACU  Anesthesia Type:General  Level of Consciousness: awake, alert  and oriented  Airway & Oxygen Therapy: Patient Spontanous Breathing  Post-op Assessment: Report given to RN and Post -op Vital signs reviewed and stable  Post vital signs: Reviewed and stable  Last Vitals:  Vitals Value Taken Time  BP 138/95 02/18/21 1323  Temp    Pulse 67 02/18/21 1328  Resp 14 02/18/21 1325  SpO2 100 % 02/18/21 1328  Vitals shown include unvalidated device data.  Last Pain:  Vitals:   02/18/21 1147  TempSrc:   PainSc: 10-Worst pain ever         Complications: No notable events documented.

## 2021-02-18 NOTE — Telephone Encounter (Signed)
Recall for mri/mrcp °

## 2021-02-18 NOTE — Progress Notes (Signed)
°   02/18/21 1050  Vitals  Temp 98.1 F (36.7 C)  Temp Source Oral  BP 128/83  BP Location Left Arm  BP Method Automatic  Patient Position (if appropriate) Lying  Pulse Rate 84  Pulse Rate Source Monitor  Resp 20  Oxygen Therapy  SpO2 100 %  O2 Device Room Air  Dialysis Weight  Weight 36.8 kg  Type of Weight Post-Dialysis  Post-Hemodialysis Assessment  Rinseback Volume (mL) 250 mL  KECN 211 V  Dialyzer Clearance Lightly streaked  Duration of HD Treatment -hour(s) 3.5 hour(s)  Hemodialysis Intake (mL) 500 mL  UF Total -Machine (mL) 1500 mL  Net UF (mL) 1000 mL  Tolerated HD Treatment Yes  Post-Hemodialysis Comments tx complete-pt stable  Hemodialysis Catheter Left Internal jugular Double lumen Permanent (Tunneled)  Placement Date/Time: 12/30/20 1137   Placed prior to admission: No  Time Out: Correct patient;Correct site;Correct procedure  Maximum sterile barrier precautions: Hand hygiene;Cap;Mask;Sterile gown;Sterile gloves;Large sterile sheet  Site Prep: Other ...  Site Condition No complications  Blue Lumen Status Flushed;Heparin locked;Dead end cap in place  Red Lumen Status Flushed;Dead end cap in place;Heparin locked  Catheter fill solution Heparin 1000 units/ml  Catheter fill volume (Arterial) 1.9 cc  Catheter fill volume (Venous) 1.9  Dressing Type Transparent  Dressing Status Clean;Dry;Intact  Antimicrobial disc in place? Yes  Interventions New dressing;Antimicrobial disc changed  Drainage Description None  Dressing Change Due 02/25/21  Post treatment catheter status Capped and Clamped   HD tx complete, pt stable. Pt wanting to terminate treatment early due to right arm pain. Pt given prn percocet, with minimal effects noted. Pt able to complete tx without incident. Pt now with small emesis will report off to OR staff as pt is waiting for transfer to procedure.

## 2021-02-18 NOTE — Progress Notes (Signed)
Patient ID: [name], female   DOB: September 18, 1950, 70 y.o.   MRN: [id number]  PROGRESS NOTE    JAYONNA MEYERING  HKV:[id number] DOB: 07-26-1950 DOA: 02/13/2021 PCP: Hilbert Corrigan, MD   Brief Narrative:  70 y.o. female with medical history significant of ESRD on HD, atrial flutter on Eliquis, pulmonary hypertension, HIV, and GERD, recent fistula infection on vancomycin treatment with HD as an outpatient, recent admission from 01/23/2021-12/12 /2022 for syncopal episode while undergoing hemodialysis; subsequently patient was noted to be hypertensive and had seizure for which MRI revealed concern for PRES requiring Cardene drip and transferred to ICU for continuous EEG monitoring.  During hospitalization patient was noted to have a pseudoaneurysm rupture of the right arm fistula taken to the OR emergently and underwent redo of the right brachial exposure and control of hemorrhage, harvest right greater saphenous vein, right brachial brachial bypass, and application of skin substitute to the right upper extremity.  Eliquis and Plavix were initially held and she required transfusion of 2 units of packed red blood cells on 12/6.  Hemoglobin had remained stable and Eliquis only was resumed on 12/7. She presented back to the ED with bleeding from fistula; on presentation in the ED, hemoglobin was 8 with subsequent hemoglobin of 6.6.  Vascular surgery and nephrology were consulted.  Assessment & Plan:   Bleeding around AV fistula wound -Patient with recent history of ruptured pseudoaneurysm status post brachial brachial bypass with ligation of the need of brachial artery on 12/5.   -Dressing changes as per vascular surgery.  Anticoagulation to remain on hold till cleared by vascular surgery. -Vascular surgery planning for surgical intervention today.  Acute blood loss anemia/anemia of chronic disease from renal failure -Hemoglobin 6.6 on presentation.  Status post 1 unit packed red cell  transfusion; hemoglobin 7.6 this morning.  Monitor H&H.  Transfuse if hemoglobin is less than 7.  End-stage renal disease on hemodialysis -Nephrology following.  Dialysis as per nephrology schedule  hypokalemia -Monitor  Atrial flutter on chronic anticoagulation -Continue metoprolol.  Currently rate controlled.  Eliquis on hold  Nausea, vomiting and diarrhea -Patient was on scheduled Senokot at the rehab.  Senokot on hold.  Monitor.  Advance diet as tolerated: Patient's oral intake has been poor - use antiemetics as needed -Stool for C. difficile is positive for antigen but negative for toxin or PCR: Possibly colonizer; GI panel PCR pending.  Will not treat for for C. difficile colitis since C. difficile toxin and PCR are both negative.   -Diarrhea improving.  Use Imodium as needed  HIV  -continue current antiretroviral therapy  Essential hypertension -Blood pressure on the lower side.  Continue amlodipine and metoprolol  GERD -Continue PPI  Generalized deconditioning -Patient currently from SNF and will return back to SNF once medically improves.  Social worker following  DVT prophylaxis: SCDs Code Status: DNR Family Communication: None at bedside Disposition Plan: Status is: Inpatient because: Of need for surgical intervention per vascular surgery Consultants: Vascular surgery/nephrology  Procedures: None  Antimicrobials: Antiretrovirals  Subjective: Patient seen and examined at bedside undergoing hemodialysis.  Poor historian.  No seizures, agitation, fever or vomiting reported  objective: Vitals:   02/18/21 0709 02/18/21 0717 02/18/21 0730 02/18/21 0800  BP: 129/77 123/71 133/81 119/73  Pulse: 72 71 69 72  Resp:      Temp:      TempSrc:      SpO2:      Weight:      Height:  Intake/Output Summary (Last 24 hours) at 02/18/2021 0825 Last data filed at 02/18/2021 0447 Gross per 24 hour  Intake 520 ml  Output 0 ml  Net 520 ml    Filed Weights    02/15/21 1105 02/16/21 0510 02/18/21 0700  Weight: 37.5 kg 40.6 kg 38.9 kg    Examination:  General exam: On room air currently.  No acute distress.  Looks chronically ill and deconditioned.  Respiratory system: Bilateral decreased breath sounds at bases with scattered crackles  cardiovascular system: S1-S2 heard; currently rate controlled gastrointestinal system: Abdomen is distended slightly, soft and nontender.  Normal bowel sounds heard  extremities: No cyanosis; lower extremity edema present bilaterally Central nervous system: Still very slow to respond; very poor historian; no focal neurological deficits.  Moving extremities skin: Right upper extremity dressing present.  No obvious other petechiae or lesions present  psychiatry: Affect is very flat.  Does not participate in conversation much   Data Reviewed: I have personally reviewed following labs and imaging studies  CBC: Recent Labs  Lab 02/13/21 0647 02/13/21 1609 02/14/21 0055 02/15/21 0229 02/16/21 0257 02/17/21 0400 02/18/21 0214  WBC 8.7  --   --  6.0 5.6 6.4 4.6  NEUTROABS 7.2  --   --  4.2 3.7 4.5 3.1  HGB 8.0*   < > 8.7* 8.7* 7.7* 8.0* 7.6*  HCT 26.7*   < > 26.6* 26.7* 23.8* 25.6* 24.1*  MCV 101.1*  --   --  90.5 92.2 94.8 93.8  PLT 268  --   --  202 193 184 176   < > = values in this interval not displayed.    Basic Metabolic Panel: Recent Labs  Lab 02/14/21 0055 02/15/21 0229 02/16/21 0257 02/17/21 0400 02/18/21 0214  NA 137 136 138 138 136  K 4.2 3.2* 3.0* 3.4* 3.4*  CL 102 100 102 104 101  CO2 24 27 25 24 24   GLUCOSE 91 95 89 79 81  BUN 36* 18 12 21  29*  CREATININE 7.38* 4.52* 4.27* 6.15* 7.66*  CALCIUM 8.1* 8.6* 8.8* 8.6* 8.6*  PHOS 4.7* 3.9 3.6 4.1 5.6*    GFR: Estimated Creatinine Clearance: 4.3 mL/min (A) (by C-G formula based on SCr of 7.66 mg/dL (H)). Liver Function Tests: Recent Labs  Lab 02/14/21 0055 02/15/21 0229 02/16/21 0257 02/17/21 0400 02/18/21 0214  ALBUMIN 2.1*  2.3* 2.2* 2.2* 2.1*    No results for input(s): LIPASE, AMYLASE in the last 168 hours. No results for input(s): AMMONIA in the last 168 hours. Coagulation Profile: No results for input(s): INR, PROTIME in the last 168 hours. Cardiac Enzymes: No results for input(s): CKTOTAL, CKMB, CKMBINDEX, TROPONINI in the last 168 hours. BNP (last 3 results) No results for input(s): PROBNP in the last 8760 hours. HbA1C: No results for input(s): HGBA1C in the last 72 hours. CBG: Recent Labs  Lab 02/16/21 2134  GLUCAP 92    Lipid Profile: No results for input(s): CHOL, HDL, LDLCALC, TRIG, CHOLHDL, LDLDIRECT in the last 72 hours. Thyroid Function Tests: No results for input(s): TSH, T4TOTAL, FREET4, T3FREE, THYROIDAB in the last 72 hours. Anemia Panel: No results for input(s): VITAMINB12, FOLATE, FERRITIN, TIBC, IRON, RETICCTPCT in the last 72 hours. Sepsis Labs: No results for input(s): PROCALCITON, LATICACIDVEN in the last 168 hours.  Recent Results (from the past 240 hour(s))  Culture, blood (Routine X 2) w Reflex to ID Panel     Status: None   Collection Time: 02/08/21 11:34 AM   Specimen: BLOOD  Result Value Ref Range Status   Specimen Description BLOOD BLOOD LEFT HAND  Final   Special Requests   Final    BOTTLES DRAWN AEROBIC AND ANAEROBIC Blood Culture results may not be optimal due to an inadequate volume of blood received in culture bottles   Culture   Final    NO GROWTH 5 DAYS Performed at [facility] [address] [place], [place] [address]    Report Status 02/13/2021 FINAL  Final  Culture, blood (Routine X 2) w Reflex to ID Panel     Status: None   Collection Time: 02/08/21 11:59 AM   Specimen: BLOOD  Result Value Ref Range Status   Specimen Description BLOOD BLOOD LEFT HAND  Final   Special Requests AEROBIC BOTTLE ONLY Blood Culture adequate volume  Final   Culture   Final    NO GROWTH 5 DAYS Performed at [facility], [place] [address] [place], [place]  [address]    Report Status 02/13/2021 FINAL  Final  Resp Panel by RT-PCR (Flu A&B, Covid) Nasopharyngeal Swab     Status: None   Collection Time: 02/10/21 12:57 PM   Specimen: Nasopharyngeal Swab; Nasopharyngeal(NP) swabs in vial transport medium  Result Value Ref Range Status   SARS Coronavirus 2 by RT PCR NEGATIVE NEGATIVE Final    Comment: (NOTE) SARS-CoV-2 target nucleic acids are NOT DETECTED.  The SARS-CoV-2 RNA is generally detectable in upper respiratory specimens during the acute phase of infection. The lowest concentration of SARS-CoV-2 viral copies this assay can detect is 138 copies/mL. A negative result does not preclude SARS-Cov-2 infection and should not be used as the sole basis for treatment or other patient management decisions. A negative result may occur with  improper specimen collection/handling, submission of specimen other than nasopharyngeal swab, presence of viral mutation(s) within the areas targeted by this assay, and inadequate number of viral copies(<138 copies/mL). A negative result must be combined with clinical observations, patient history, and epidemiological information. The expected result is Negative.  Fact Sheet for Patients:  EntrepreneurPulse.com.au  Fact Sheet for Healthcare Providers:  IncredibleEmployment.be  This test is no t yet approved or cleared by the [place] FDA and  has been authorized for detection and/or diagnosis of SARS-CoV-2 by FDA under an Emergency Use Authorization (EUA). This EUA will remain  in effect (meaning this test can be used) for the duration of the COVID-19 declaration under Section 564(b)(1) of the Act, 21 U.S.C.section 360bbb-3(b)(1), unless the authorization is terminated  or revoked sooner.       Influenza A by PCR NEGATIVE NEGATIVE Final   Influenza B by PCR NEGATIVE NEGATIVE Final    Comment: (NOTE) The Xpert Xpress SARS-CoV-2/FLU/RSV plus assay is intended as an  aid in the diagnosis of influenza from Nasopharyngeal swab specimens and should not be used as a sole basis for treatment. Nasal washings and aspirates are unacceptable for Xpert Xpress SARS-CoV-2/FLU/RSV testing.  Fact Sheet for Patients: EntrepreneurPulse.com.au  Fact Sheet for Healthcare Providers: IncredibleEmployment.be  This test is not yet approved or cleared by the [place] FDA and has been authorized for detection and/or diagnosis of SARS-CoV-2 by FDA under an Emergency Use Authorization (EUA). This EUA will remain in effect (meaning this test can be used) for the duration of the COVID-19 declaration under Section 564(b)(1) of the Act, 21 U.S.C. section 360bbb-3(b)(1), unless the authorization is terminated or revoked.  Performed at [facility], [place] [address] [place], [place] [address]  Resp Panel by RT-PCR (Flu A&B, Covid) Nasopharyngeal Swab     Status: None   Collection Time: 02/13/21  8:16 AM   Specimen: Nasopharyngeal Swab; Nasopharyngeal(NP) swabs in vial transport medium  Result Value Ref Range Status   SARS Coronavirus 2 by RT PCR NEGATIVE NEGATIVE Final    Comment: (NOTE) SARS-CoV-2 target nucleic acids are NOT DETECTED.  The SARS-CoV-2 RNA is generally detectable in upper respiratory specimens during the acute phase of infection. The lowest concentration of SARS-CoV-2 viral copies this assay can detect is 138 copies/mL. A negative result does not preclude SARS-Cov-2 infection and should not be used as the sole basis for treatment or other patient management decisions. A negative result may occur with  improper specimen collection/handling, submission of specimen other than nasopharyngeal swab, presence of viral mutation(s) within the areas targeted by this assay, and inadequate number of viral copies(<138 copies/mL). A negative result must be combined with clinical observations, patient history, and  epidemiological information. The expected result is Negative.  Fact Sheet for Patients:  EntrepreneurPulse.com.au  Fact Sheet for Healthcare Providers:  IncredibleEmployment.be  This test is no t yet approved or cleared by the [place] FDA and  has been authorized for detection and/or diagnosis of SARS-CoV-2 by FDA under an Emergency Use Authorization (EUA). This EUA will remain  in effect (meaning this test can be used) for the duration of the COVID-19 declaration under Section 564(b)(1) of the Act, 21 U.S.C.section 360bbb-3(b)(1), unless the authorization is terminated  or revoked sooner.       Influenza A by PCR NEGATIVE NEGATIVE Final   Influenza B by PCR NEGATIVE NEGATIVE Final    Comment: (NOTE) The Xpert Xpress SARS-CoV-2/FLU/RSV plus assay is intended as an aid in the diagnosis of influenza from Nasopharyngeal swab specimens and should not be used as a sole basis for treatment. Nasal washings and aspirates are unacceptable for Xpert Xpress SARS-CoV-2/FLU/RSV testing.  Fact Sheet for Patients: EntrepreneurPulse.com.au  Fact Sheet for Healthcare Providers: IncredibleEmployment.be  This test is not yet approved or cleared by the [place] FDA and has been authorized for detection and/or diagnosis of SARS-CoV-2 by FDA under an Emergency Use Authorization (EUA). This EUA will remain in effect (meaning this test can be used) for the duration of the COVID-19 declaration under Section 564(b)(1) of the Act, 21 U.S.C. section 360bbb-3(b)(1), unless the authorization is terminated or revoked.  Performed at [facility], [address], [place], [place] [address]   C Difficile Quick Screen w PCR reflex     Status: Abnormal   Collection Time: 02/14/21  4:12 PM   Specimen: STOOL  Result Value Ref Range Status   C Diff antigen POSITIVE (A) NEGATIVE Final    Comment: CRITICAL RESULT CALLED TO, READ  BACK BY AND VERIFIED WITH: K. MASHORE RN 02/14/2021 @1949  [place] JW    C Diff toxin NEGATIVE NEGATIVE Final   C Diff interpretation Results are indeterminate. See PCR results.  Final    Comment: Performed at [facility], [place] [address] [place], [place] [address]  Gastrointestinal Panel by PCR , Stool     Status: None   Collection Time: 02/14/21  4:12 PM   Specimen: Stool  Result Value Ref Range Status   Campylobacter species NOT DETECTED NOT DETECTED Final   Plesimonas shigelloides NOT DETECTED NOT DETECTED Final   Salmonella species NOT DETECTED NOT DETECTED Final   Yersinia enterocolitica NOT DETECTED NOT DETECTED Final   Vibrio species NOT DETECTED NOT DETECTED  Final   Vibrio cholerae NOT DETECTED NOT DETECTED Final   Enteroaggregative E coli (EAEC) NOT DETECTED NOT DETECTED Final   Enteropathogenic E coli (EPEC) NOT DETECTED NOT DETECTED Final   Enterotoxigenic E coli (ETEC) NOT DETECTED NOT DETECTED Final   Shiga like toxin producing E coli (STEC) NOT DETECTED NOT DETECTED Final   Shigella/Enteroinvasive E coli (EIEC) NOT DETECTED NOT DETECTED Final   Cryptosporidium NOT DETECTED NOT DETECTED Final   Cyclospora cayetanensis NOT DETECTED NOT DETECTED Final   Entamoeba histolytica NOT DETECTED NOT DETECTED Final   Giardia lamblia NOT DETECTED NOT DETECTED Final   Adenovirus F40/41 NOT DETECTED NOT DETECTED Final   Astrovirus NOT DETECTED NOT DETECTED Final   Norovirus GI/GII NOT DETECTED NOT DETECTED Final   Rotavirus A NOT DETECTED NOT DETECTED Final   Sapovirus (I, II, IV, and V) NOT DETECTED NOT DETECTED Final    Comment: Performed at [facility], [address] [place], [place] [address]  C. Diff by PCR, Reflexed     Status: None   Collection Time: 02/14/21  4:12 PM  Result Value Ref Range Status   Toxigenic C. Difficile by PCR NEGATIVE NEGATIVE Final    Comment: Patient is colonized with non toxigenic C. difficile. May not need treatment unless significant  symptoms are present. Performed at [facility], [place] [address], [place], [place] [address]           Radiology Studies: No results found.      Scheduled Meds:  (feeding supplement) PROSource Plus  30 mL Oral BID BM   acetaminophen  1,000 mg Oral Once   amLODipine  10 mg Oral Daily   vitamin C  500 mg Oral BID   bictegravir-emtricitabine-tenofovir AF  1 tablet Oral Daily   calcitRIOL  1.25 mcg Oral Q T,Th,Sa-HD   Chlorhexidine Gluconate Cloth  6 each Topical Daily   darbepoetin (ARANESP) injection - DIALYSIS  40 mcg Intravenous Q Sat-HD   lacosamide  150 mg Oral BID   levETIRAcetam  1,000 mg Oral Daily   levETIRAcetam  250 mg Oral Q T,Th,Sat-1800   megestrol  400 mg Oral Daily   metoprolol tartrate  25 mg Oral BID   mirtazapine  15 mg Oral QHS   nystatin  5 mL Oral QID   ondansetron  4 mg Oral Q6H   pantoprazole  40 mg Oral QAC breakfast   saccharomyces boulardii  250 mg Oral BID   thiamine  100 mg Oral Daily   venlafaxine  75 mg Oral BID   Continuous Infusions:  sodium chloride     sodium chloride             Aline August, MD [facility] 02/18/2021, 8:25 AM

## 2021-02-18 NOTE — Progress Notes (Signed)
°  Progress Note    02/18/2021 11:14 AM Day of Surgery  Subjective:  no overnight issues  Vitals:   02/18/21 1040 02/18/21 1050  BP: 120/67 128/83  Pulse: 74 84  Resp: 19 20  Temp:  98.1 F (36.7 C)  SpO2:  100%    Physical Exam: Awake and alert Right arm dressing cdi  CBC    Component Value Date/Time   WBC 4.6 02/18/2021 0214   RBC 2.57 (L) 02/18/2021 0214   HGB 7.6 (L) 02/18/2021 0214   HCT 24.1 (L) 02/18/2021 0214   PLT 176 02/18/2021 0214   MCV 93.8 02/18/2021 0214   MCH 29.6 02/18/2021 0214   MCHC 31.5 02/18/2021 0214   RDW 20.0 (H) 02/18/2021 0214   LYMPHSABS 1.1 02/18/2021 0214   MONOABS 0.4 02/18/2021 0214   EOSABS 0.1 02/18/2021 0214   BASOSABS 0.0 02/18/2021 0214    BMET    Component Value Date/Time   NA 136 02/18/2021 0214   K 3.4 (L) 02/18/2021 0214   CL 101 02/18/2021 0214   CO2 24 02/18/2021 0214   GLUCOSE 81 02/18/2021 0214   BUN 29 (H) 02/18/2021 0214   CREATININE 7.66 (H) 02/18/2021 0214   CREATININE 5.66 (H) 12/22/2019 1117   CALCIUM 8.6 (L) 02/18/2021 0214   CALCIUM 9.0 03/12/2011 0527   GFRNONAA 5 (L) 02/18/2021 0214   GFRNONAA 5 (L) 02/12/2015 1000   GFRAA 9 (L) 10/22/2019 0105   GFRAA 5 (L) 02/12/2015 1000    INR    Component Value Date/Time   INR 1.2 01/24/2021 1054     Intake/Output Summary (Last 24 hours) at 02/18/2021 1114 Last data filed at 02/18/2021 1050 Gross per 24 hour  Intake 280 ml  Output 1000 ml  Net -720 ml     Assessment/plan:  70 y.o. female with recent rue bypass with vein now with evidence of infection. Plan for washout of wounds today in OR.   Danniella Robben C. Donzetta Matters, MD [facility] Office: [phone] Pager: [phone]  02/18/2021 11:14 AM

## 2021-02-19 ENCOUNTER — Encounter (HOSPITAL_COMMUNITY): Payer: Self-pay | Admitting: Vascular Surgery

## 2021-02-19 LAB — CBC WITH DIFFERENTIAL/PLATELET
Abs Immature Granulocytes: 0.03 10*3/uL (ref 0.00–0.07)
Basophils Absolute: 0 10*3/uL (ref 0.0–0.1)
Basophils Relative: 0 %
Eosinophils Absolute: 0 10*3/uL (ref 0.0–0.5)
Eosinophils Relative: 0 %
HCT: 27.4 % — ABNORMAL LOW (ref 36.0–46.0)
Hemoglobin: 8.6 g/dL — ABNORMAL LOW (ref 12.0–15.0)
Immature Granulocytes: 1 %
Lymphocytes Relative: 26 %
Lymphs Abs: 1.6 10*3/uL (ref 0.7–4.0)
MCH: 29.6 pg (ref 26.0–34.0)
MCHC: 31.4 g/dL (ref 30.0–36.0)
MCV: 94.2 fL (ref 80.0–100.0)
Monocytes Absolute: 0.4 10*3/uL (ref 0.1–1.0)
Monocytes Relative: 7 %
Neutro Abs: 4.2 10*3/uL (ref 1.7–7.7)
Neutrophils Relative %: 66 %
Platelets: 180 10*3/uL (ref 150–400)
RBC: 2.91 MIL/uL — ABNORMAL LOW (ref 3.87–5.11)
RDW: 20.4 % — ABNORMAL HIGH (ref 11.5–15.5)
WBC: 6.3 10*3/uL (ref 4.0–10.5)
nRBC: 0 % (ref 0.0–0.2)

## 2021-02-19 LAB — MRSA NEXT GEN BY PCR, NASAL: MRSA by PCR Next Gen: NOT DETECTED

## 2021-02-19 MED ORDER — DARBEPOETIN ALFA 100 MCG/0.5ML IJ SOSY: 100.0000 ug | PREFILLED_SYRINGE | INTRAMUSCULAR | Status: DC

## 2021-02-19 MED ORDER — SODIUM CHLORIDE 0.9 % IV SOLN: 100.0000 mL | INTRAVENOUS | Status: DC | PRN

## 2021-02-19 NOTE — Plan of Care (Signed)
°  Problem: Activity: Goal: Risk for activity intolerance will decrease Outcome: Progressing   Problem: Nutrition: Goal: Adequate nutrition will be maintained Outcome: Progressing   Problem: Coping: Goal: Level of anxiety will decrease Outcome: Progressing   Problem: Education: Goal: Knowledge of disease and its progression will improve Outcome: Progressing   Problem: Fluid Volume: Goal: Compliance with measures to maintain balanced fluid volume will improve Outcome: Progressing   Problem: Nutritional: Goal: Ability to make healthy dietary choices will improve Outcome: Progressing   Problem: Clinical Measurements: Goal: Complications related to the disease process, condition or treatment will be avoided or minimized Outcome: Progressing

## 2021-02-19 NOTE — Progress Notes (Signed)
Patient ID: [name], female   DOB: Jan 18, 1951, 70 y.o.   MRN: [id number]  PROGRESS NOTE    [name]  KDX:[id number] DOB: 11-02-1950 DOA: 02/13/2021 PCP: Hilbert Corrigan, MD   Brief Narrative:  70 y.o. female with medical history significant of ESRD on HD, atrial flutter on Eliquis, pulmonary hypertension, HIV, and GERD, recent fistula infection on vancomycin treatment with HD as an outpatient, recent admission from 01/23/2021-12/12 /2022 for syncopal episode while undergoing hemodialysis; subsequently patient was noted to be hypertensive and had seizure for which MRI revealed concern for PRES requiring Cardene drip and transferred to ICU for continuous EEG monitoring.  During hospitalization patient was noted to have a pseudoaneurysm rupture of the right arm fistula taken to the OR emergently and underwent redo of the right brachial exposure and control of hemorrhage, harvest right greater saphenous vein, right brachial brachial bypass, and application of skin substitute to the right upper extremity.  Eliquis and Plavix were initially held and she required transfusion of 2 units of packed red blood cells on 12/6.  Hemoglobin had remained stable and Eliquis only was resumed on 12/7. She presented back to the ED with bleeding from fistula; on presentation in the ED, hemoglobin was 8 with subsequent hemoglobin of 6.6.  Vascular surgery and nephrology were consulted.  Assessment & Plan:   Bleeding around AV fistula wound -Patient with recent history of ruptured pseudoaneurysm status post brachial brachial bypass with ligation of the need of brachial artery on 12/5.   - I&D of right arm postsurgical wound on 12/20 --Dressing changes as per vascular surgery.  Anticoagulation to remain on hold till cleared by vascular surgery. -Surgical wound culture in process -Management per vascular surgery, vascular surgery input appreciated  Acute blood loss anemia/anemia of chronic disease  from renal failure -Hemoglobin 6.6 on presentation.  Status post 1 unit packed red cell transfusion pm 12/15   Monitor H&H.  Transfuse if hemoglobin is less than 7.  End-stage renal disease on hemodialysis -Nephrology following.  Dialysis as per nephrology schedule  hypokalemia -Monitor, plan for nephrology  Atrial flutter on chronic anticoagulation -Continue metoprolol.  Currently rate controlled.  Eliquis on hold, need vascular surgery clearance for resuming , also need to monitor hemoglobin  Nausea, vomiting and diarrhea -Patient was on scheduled Senokot at the rehab.  Senokot on hold.  Monitor.  Advance diet as tolerated: Patient's oral intake has been poor - use antiemetics as needed -Stool for C. difficile is positive for antigen but negative for toxin or PCR: Possibly colonizer; GI panel PCR negative -Per previous attending plan to not treat for for C. difficile colitis since C. difficile toxin and PCR are both negative.   -Diarrhea improving.  Use Imodium as needed  HIV  -continue current antiretroviral therapy  Essential hypertension -Blood pressure on the lower side.  Continue amlodipine and metoprolol  GERD -Continue PPI  Generalized deconditioning -Patient currently from SNF and will return back to SNF once medically improves.  Social worker following  DVT prophylaxis: SCDs Code Status: DNR Family Communication: None at bedside Disposition Plan: Status is: Inpatient because: Awaiting for surgical culture, need vascular surgery clearance Consultants: Vascular surgery/nephrology  Procedures: None  Antimicrobials: Antiretrovirals  Subjective: POD#1 Reports Right hand edematous, denies pain at rest, nio  fever She wants to work with PT, reports able to walk prior to coming to the hospital   No seizures,  no agitation,  objective: Vitals:   02/18/21 1820 02/18/21 2111 02/19/21 0533  02/19/21 1743  BP: (!) 145/93 (!) 151/90 (!) 183/92 127/78  Pulse: 68 72 78  83  Resp: 18 18 18 18   Temp: 97.6 F (36.4 C) 98.7 F (37.1 C) 98.5 F (36.9 C) 98.4 F (36.9 C)  TempSrc:  Oral  Oral  SpO2: 98% 100% 100% 100%  Weight:      Height:        Intake/Output Summary (Last 24 hours) at 02/19/2021 1943 Last data filed at 02/19/2021 1750 Gross per 24 hour  Intake 714 ml  Output 0 ml  Net 714 ml   Filed Weights   02/16/21 0510 02/18/21 0700 02/18/21 1050  Weight: 40.6 kg 38.9 kg 36.8 kg    Examination:  General exam: Frail , weak ,chronically ill appearing alert,communicative,calm, slow in answering question but aaox3, dialysis catheter left chest  Respiratory system: Clear to auscultation. Respiratory effort normal. Cardiovascular system:  RRR.  Gastrointestinal system: Abdomen is nondistended, soft and nontender.  Normal bowel sounds heard. Central nervous system: Alert and oriented. No focal neurological deficits. Extremities:  right arm in dressing, right hand edmatous, left arm prior avf, right leg dressing intact Skin: No rashes, lesions or ulcers Psychiatry: Judgement and insight appear normal. Mood & affect appropriate.    Data Reviewed: I have personally reviewed following labs and imaging studies  CBC: Recent Labs  Lab 02/15/21 0229 02/16/21 0257 02/17/21 0400 02/18/21 0214 02/18/21 1201 02/19/21 0407  WBC 6.0 5.6 6.4 4.6  --  6.3  NEUTROABS 4.2 3.7 4.5 3.1  --  4.2  HGB 8.7* 7.7* 8.0* 7.6* 9.9* 8.6*  HCT 26.7* 23.8* 25.6* 24.1* 29.0* 27.4*  MCV 90.5 92.2 94.8 93.8  --  94.2  PLT 202 193 184 176  --  841   Basic Metabolic Panel: Recent Labs  Lab 02/14/21 0055 02/15/21 0229 02/16/21 0257 02/17/21 0400 02/18/21 0214 02/18/21 1201  NA 137 136 138 138 136 136  K 4.2 3.2* 3.0* 3.4* 3.4* 3.3*  CL 102 100 102 104 101 97*  CO2 24 27 25 24 24   --   GLUCOSE 91 95 89 79 81 81  BUN 36* 18 12 21  29* 6*  CREATININE 7.38* 4.52* 4.27* 6.15* 7.66* 2.70*  CALCIUM 8.1* 8.6* 8.8* 8.6* 8.6*  --   PHOS 4.7* 3.9 3.6 4.1 5.6*  --     GFR: Estimated Creatinine Clearance: 11.6 mL/min (A) (by C-G formula based on SCr of 2.7 mg/dL (H)). Liver Function Tests: Recent Labs  Lab 02/14/21 0055 02/15/21 0229 02/16/21 0257 02/17/21 0400 02/18/21 0214  ALBUMIN 2.1* 2.3* 2.2* 2.2* 2.1*   No results for input(s): LIPASE, AMYLASE in the last 168 hours. No results for input(s): AMMONIA in the last 168 hours. Coagulation Profile: No results for input(s): INR, PROTIME in the last 168 hours. Cardiac Enzymes: No results for input(s): CKTOTAL, CKMB, CKMBINDEX, TROPONINI in the last 168 hours. BNP (last 3 results) No results for input(s): PROBNP in the last 8760 hours. HbA1C: No results for input(s): HGBA1C in the last 72 hours. CBG: Recent Labs  Lab 02/16/21 2134  GLUCAP 92   Lipid Profile: No results for input(s): CHOL, HDL, LDLCALC, TRIG, CHOLHDL, LDLDIRECT in the last 72 hours. Thyroid Function Tests: No results for input(s): TSH, T4TOTAL, FREET4, T3FREE, THYROIDAB in the last 72 hours. Anemia Panel: No results for input(s): VITAMINB12, FOLATE, FERRITIN, TIBC, IRON, RETICCTPCT in the last 72 hours. Sepsis Labs: No results for input(s): PROCALCITON, LATICACIDVEN in the last 168 hours.  Recent Results (from  the past 240 hour(s))  Resp Panel by RT-PCR (Flu A&B, Covid) Nasopharyngeal Swab     Status: None   Collection Time: 02/10/21 12:57 PM   Specimen: Nasopharyngeal Swab; Nasopharyngeal(NP) swabs in vial transport medium  Result Value Ref Range Status   SARS Coronavirus 2 by RT PCR NEGATIVE NEGATIVE Final    Comment: (NOTE) SARS-CoV-2 target nucleic acids are NOT DETECTED.  The SARS-CoV-2 RNA is generally detectable in upper respiratory specimens during the acute phase of infection. The lowest concentration of SARS-CoV-2 viral copies this assay can detect is 138 copies/mL. A negative result does not preclude SARS-Cov-2 infection and should not be used as the sole basis for treatment or other patient management  decisions. A negative result may occur with  improper specimen collection/handling, submission of specimen other than nasopharyngeal swab, presence of viral mutation(s) within the areas targeted by this assay, and inadequate number of viral copies(<138 copies/mL). A negative result must be combined with clinical observations, patient history, and epidemiological information. The expected result is Negative.  Fact Sheet for Patients:  EntrepreneurPulse.com.au  Fact Sheet for Healthcare Providers:  IncredibleEmployment.be  This test is no t yet approved or cleared by the [place] FDA and  has been authorized for detection and/or diagnosis of SARS-CoV-2 by FDA under an Emergency Use Authorization (EUA). This EUA will remain  in effect (meaning this test can be used) for the duration of the COVID-19 declaration under Section 564(b)(1) of the Act, 21 U.S.C.section 360bbb-3(b)(1), unless the authorization is terminated  or revoked sooner.       Influenza A by PCR NEGATIVE NEGATIVE Final   Influenza B by PCR NEGATIVE NEGATIVE Final    Comment: (NOTE) The Xpert Xpress SARS-CoV-2/FLU/RSV plus assay is intended as an aid in the diagnosis of influenza from Nasopharyngeal swab specimens and should not be used as a sole basis for treatment. Nasal washings and aspirates are unacceptable for Xpert Xpress SARS-CoV-2/FLU/RSV testing.  Fact Sheet for Patients: EntrepreneurPulse.com.au  Fact Sheet for Healthcare Providers: IncredibleEmployment.be  This test is not yet approved or cleared by the [place] FDA and has been authorized for detection and/or diagnosis of SARS-CoV-2 by FDA under an Emergency Use Authorization (EUA). This EUA will remain in effect (meaning this test can be used) for the duration of the COVID-19 declaration under Section 564(b)(1) of the Act, 21 U.S.C. section 360bbb-3(b)(1), unless the  authorization is terminated or revoked.  Performed at [facility], [place] [address], [place], [place] [address]   Resp Panel by RT-PCR (Flu A&B, Covid) Nasopharyngeal Swab     Status: None   Collection Time: 02/13/21  8:16 AM   Specimen: Nasopharyngeal Swab; Nasopharyngeal(NP) swabs in vial transport medium  Result Value Ref Range Status   SARS Coronavirus 2 by RT PCR NEGATIVE NEGATIVE Final    Comment: (NOTE) SARS-CoV-2 target nucleic acids are NOT DETECTED.  The SARS-CoV-2 RNA is generally detectable in upper respiratory specimens during the acute phase of infection. The lowest concentration of SARS-CoV-2 viral copies this assay can detect is 138 copies/mL. A negative result does not preclude SARS-Cov-2 infection and should not be used as the sole basis for treatment or other patient management decisions. A negative result may occur with  improper specimen collection/handling, submission of specimen other than nasopharyngeal swab, presence of viral mutation(s) within the areas targeted by this assay, and inadequate number of viral copies(<138 copies/mL). A negative result must be combined with clinical observations, patient history, and epidemiological information. The expected  result is Negative.  Fact Sheet for Patients:  EntrepreneurPulse.com.au  Fact Sheet for Healthcare Providers:  IncredibleEmployment.be  This test is no t yet approved or cleared by the [place] FDA and  has been authorized for detection and/or diagnosis of SARS-CoV-2 by FDA under an Emergency Use Authorization (EUA). This EUA will remain  in effect (meaning this test can be used) for the duration of the COVID-19 declaration under Section 564(b)(1) of the Act, 21 U.S.C.section 360bbb-3(b)(1), unless the authorization is terminated  or revoked sooner.       Influenza A by PCR NEGATIVE NEGATIVE Final   Influenza B by PCR NEGATIVE NEGATIVE Final     Comment: (NOTE) The Xpert Xpress SARS-CoV-2/FLU/RSV plus assay is intended as an aid in the diagnosis of influenza from Nasopharyngeal swab specimens and should not be used as a sole basis for treatment. Nasal washings and aspirates are unacceptable for Xpert Xpress SARS-CoV-2/FLU/RSV testing.  Fact Sheet for Patients: EntrepreneurPulse.com.au  Fact Sheet for Healthcare Providers: IncredibleEmployment.be  This test is not yet approved or cleared by the [place] FDA and has been authorized for detection and/or diagnosis of SARS-CoV-2 by FDA under an Emergency Use Authorization (EUA). This EUA will remain in effect (meaning this test can be used) for the duration of the COVID-19 declaration under Section 564(b)(1) of the Act, 21 U.S.C. section 360bbb-3(b)(1), unless the authorization is terminated or revoked.  Performed at [facility], [address], [place], [place] [address]   C Difficile Quick Screen w PCR reflex     Status: Abnormal   Collection Time: 02/14/21  4:12 PM   Specimen: STOOL  Result Value Ref Range Status   C Diff antigen POSITIVE (A) NEGATIVE Final    Comment: CRITICAL RESULT CALLED TO, READ BACK BY AND VERIFIED WITH: K. MASHORE RN 02/14/2021 @1949  [place] JW    C Diff toxin NEGATIVE NEGATIVE Final   C Diff interpretation Results are indeterminate. See PCR results.  Final    Comment: Performed at [facility], [place] [address], [place], [place] [address]  Gastrointestinal Panel by PCR , Stool     Status: None   Collection Time: 02/14/21  4:12 PM   Specimen: Stool  Result Value Ref Range Status   Campylobacter species NOT DETECTED NOT DETECTED Final   Plesimonas shigelloides NOT DETECTED NOT DETECTED Final   Salmonella species NOT DETECTED NOT DETECTED Final   Yersinia enterocolitica NOT DETECTED NOT DETECTED Final   Vibrio species NOT DETECTED NOT DETECTED Final   Vibrio cholerae NOT DETECTED NOT DETECTED Final    Enteroaggregative E coli (EAEC) NOT DETECTED NOT DETECTED Final   Enteropathogenic E coli (EPEC) NOT DETECTED NOT DETECTED Final   Enterotoxigenic E coli (ETEC) NOT DETECTED NOT DETECTED Final   Shiga like toxin producing E coli (STEC) NOT DETECTED NOT DETECTED Final   Shigella/Enteroinvasive E coli (EIEC) NOT DETECTED NOT DETECTED Final   Cryptosporidium NOT DETECTED NOT DETECTED Final   Cyclospora cayetanensis NOT DETECTED NOT DETECTED Final   Entamoeba histolytica NOT DETECTED NOT DETECTED Final   Giardia lamblia NOT DETECTED NOT DETECTED Final   Adenovirus F40/41 NOT DETECTED NOT DETECTED Final   Astrovirus NOT DETECTED NOT DETECTED Final   Norovirus GI/GII NOT DETECTED NOT DETECTED Final   Rotavirus A NOT DETECTED NOT DETECTED Final   Sapovirus (I, II, IV, and V) NOT DETECTED NOT DETECTED Final    Comment: Performed at [facility], [address] [place], [place] [address]  C.  Diff by PCR, Reflexed     Status: None   Collection Time: 02/14/21  4:12 PM  Result Value Ref Range Status   Toxigenic C. Difficile by PCR NEGATIVE NEGATIVE Final    Comment: Patient is colonized with non toxigenic C. difficile. May not need treatment unless significant symptoms are present. Performed at [facility], [place] [address], [place], [place] [address]   Aerobic/Anaerobic Culture w Gram Stain (surgical/deep wound)     Status: None (Preliminary result)   Collection Time: 02/18/21  1:00 PM   Specimen: Wound  Result Value Ref Range Status   Specimen Description WOUND  Final   Special Requests RIGHT ARM SPEC A  Final   Gram Stain   Final    FEW WBC PRESENT, PREDOMINANTLY MONONUCLEAR RARE GRAM POSITIVE RODS    Culture   Final    FEW GRAM NEGATIVE RODS IDENTIFICATION AND SUSCEPTIBILITIES TO FOLLOW Performed at [facility], [place] [address], [place], [place] [address]    Report Status PENDING  Incomplete  MRSA Next Gen by PCR, Nasal     Status: None   Collection Time: 02/19/21   1:18 PM   Specimen: Nasal Mucosa; Nasal Swab  Result Value Ref Range Status   MRSA by PCR Next Gen NOT DETECTED NOT DETECTED Final    Comment: (NOTE) The GeneXpert MRSA Assay (FDA approved for NASAL specimens only), is one component of a comprehensive MRSA colonization surveillance program. It is not intended to diagnose MRSA infection nor to guide or monitor treatment for MRSA infections. Test performance is not FDA approved in patients less than 71 years old. Performed at [facility], [place] [address], [place],  [address]           Radiology Studies: No results found.      Scheduled Meds:  (feeding supplement) PROSource Plus  30 mL Oral BID BM   amLODipine  10 mg Oral Daily   vitamin C  500 mg Oral BID   bictegravir-emtricitabine-tenofovir AF  1 tablet Oral Daily   calcitRIOL  1.25 mcg Oral Q T,Th,Sa-HD   Chlorhexidine Gluconate Cloth  6 each Topical Daily   [START ON 02/22/2021] darbepoetin (ARANESP) injection - DIALYSIS  100 mcg Intravenous Q Sat-HD   lacosamide  150 mg Oral BID   levETIRAcetam  1,000 mg Oral Daily   levETIRAcetam  250 mg Oral Q T,Th,Sat-1800   megestrol  400 mg Oral Daily   metoprolol tartrate  25 mg Oral BID   mirtazapine  15 mg Oral QHS   nystatin  5 mL Oral QID   ondansetron  4 mg Oral Q6H   pantoprazole  40 mg Oral QAC breakfast   saccharomyces boulardii  250 mg Oral BID   thiamine  100 mg Oral Daily   venlafaxine  75 mg Oral BID   Continuous Infusions:  sodium chloride     sodium chloride             Florencia Reasons, MD PhD FACP [facility] 02/19/2021, 7:43 PM

## 2021-02-19 NOTE — Progress Notes (Addendum)
[facility] Progress Note   Subjective:    Seen and examined patient at bedside. S/p I&D R arm postsurgical wound 02/18/21 by Dr. Donzetta Matters. She reports mild R arm pain. Denies SOB, CP, and N/V. Plan for HD 02/20/21.  Objective Vitals:   02/18/21 1432 02/18/21 1820 02/18/21 2111 02/19/21 0533  BP: (!) 144/97 (!) 145/93 (!) 151/90 (!) 183/92  Pulse: 68 68 72 78  Resp: 18 18 18 18   Temp: (!) 97.4 F (36.3 C) 97.6 F (36.4 C) 98.7 F (37.1 C) 98.5 F (36.9 C)  TempSrc: Oral  Oral   SpO2: 99% 98% 100% 100%  Weight:      Height:       Physical Exam General: Ill-appearing; NAD Heart: Normal S1 and S2; No murmurs, gallops, or rubs Lungs: Clear throughout; No wheezing, rales, or rhonchi Abdomen: Soft, non-tender, active bowel sounds Extremities: No edema BLLE; Noted ACE wrap RUE; Noted ABD pad with ACE wrap RLE-dressing clean dry and intact  Dialysis Access: L IJ [facility]   Filed Weights   02/16/21 0510 02/18/21 0700 02/18/21 1050  Weight: 40.6 kg 38.9 kg 36.8 kg    Intake/Output Summary (Last 24 hours) at 02/19/2021 1159 Last data filed at 02/19/2021 6237 Gross per 24 hour  Intake 420 ml  Output 5 ml  Net 415 ml    Additional Objective Labs: Basic Metabolic Panel: Recent Labs  Lab 02/16/21 0257 02/17/21 0400 02/18/21 0214 02/18/21 1201  NA 138 138 136 136  K 3.0* 3.4* 3.4* 3.3*  CL 102 104 101 97*  CO2 25 24 24   --   GLUCOSE 89 79 81 81  BUN 12 21 29* 6*  CREATININE 4.27* 6.15* 7.66* 2.70*  CALCIUM 8.8* 8.6* 8.6*  --   PHOS 3.6 4.1 5.6*  --    Liver Function Tests: Recent Labs  Lab 02/16/21 0257 02/17/21 0400 02/18/21 0214  ALBUMIN 2.2* 2.2* 2.1*   No results for input(s): LIPASE, AMYLASE in the last 168 hours. CBC: Recent Labs  Lab 02/15/21 0229 02/16/21 0257 02/17/21 0400 02/18/21 0214 02/18/21 1201 02/19/21 0407  WBC 6.0 5.6 6.4 4.6  --  6.3  NEUTROABS 4.2 3.7 4.5 3.1  --  4.2  HGB 8.7* 7.7* 8.0* 7.6* 9.9* 8.6*  HCT 26.7* 23.8*  25.6* 24.1* 29.0* 27.4*  MCV 90.5 92.2 94.8 93.8  --  94.2  PLT 202 193 184 176  --  180   Blood Culture    Component Value Date/Time   SDES WOUND 02/18/2021 1300   SPECREQUEST RIGHT ARM SPEC A 02/18/2021 1300   CULT FEW GRAM NEGATIVE RODS 02/18/2021 1300   REPTSTATUS PENDING 02/18/2021 1300    Cardiac Enzymes: No results for input(s): CKTOTAL, CKMB, CKMBINDEX, TROPONINI in the last 168 hours. CBG: Recent Labs  Lab 02/16/21 2134  GLUCAP 92   Iron Studies: No results for input(s): IRON, TIBC, TRANSFERRIN, FERRITIN in the last 72 hours. Lab Results  Component Value Date   INR 1.2 01/24/2021   INR 1.14 12/28/2011   INR 1.10 12/13/2010   Studies/Results: No results found.  Medications:  sodium chloride     sodium chloride     sodium chloride     sodium chloride      (feeding supplement) PROSource Plus  30 mL Oral BID BM   amLODipine  10 mg Oral Daily   vitamin C  500 mg Oral BID   bictegravir-emtricitabine-tenofovir AF  1 tablet Oral Daily   calcitRIOL  1.25 mcg Oral  Q T,Th,Sa-HD   Chlorhexidine Gluconate Cloth  6 each Topical Daily   darbepoetin (ARANESP) injection - DIALYSIS  40 mcg Intravenous Q Sat-HD   lacosamide  150 mg Oral BID   levETIRAcetam  1,000 mg Oral Daily   levETIRAcetam  250 mg Oral Q T,Th,Sat-1800   megestrol  400 mg Oral Daily   metoprolol tartrate  25 mg Oral BID   mirtazapine  15 mg Oral QHS   nystatin  5 mL Oral QID   ondansetron  4 mg Oral Q6H   pantoprazole  40 mg Oral QAC breakfast   saccharomyces boulardii  250 mg Oral BID   thiamine  100 mg Oral Daily   venlafaxine  75 mg Oral BID    Dialysis Orders: [name] TTS,EDW 41.5 kg,Bath 2K/2.5 ca,BF 350  DF 500,Time - 180 minutes Access catheter  Medications - out of hectorol and now on calcitriol - 1.25 mcg each tx. Sensipar 90 mg three times a week; Epogen 3600 units each tx, Heparin 1000 units bolus then 500 units per hour Has been on vanc 500 mg each tx (though misses  often)  Assessment/Plan: 1. Bleeding from surgical wound. Ruptured pseudoaneurysm s/p right brachial-brachial bypass with ligation of the native brachial artery on 02/03/21.  Per Vascular. Seen by [name] -[facility] following-S/p I&D R arm postsurgical wound 02/18/21 by Dr. Donzetta Matters. Continue to hold Eliquis. 2. ESRD - Usual HD TTS. Back on schedule. Next HD 12/22. No heparin . Added K+ bath. Patient under her EDW-will need to be lowered at discharge. 3. Hypokalemia -in the setting of ongoing diarrhea. K+ now 3.3. Will place on 4K bath. 4. HTN/volume- Overall BP stable. Euvolemic on exam. May be getting hypovolemic from diarrhea.  5.  Anemia-  Hgb fluctuating-now 8.6. Transfused 1 unit prbcs on 12/16.  Aranesp 40 q week starting 12/17.  Follow trends. Will raise ESA. 6. MBD - Continue calcitriol/binders. Hold Sensipar for now d/t GI upset . Follow Ca trends. Ca/Phos ok.  7. Diarrhea - C. diff PCR negative. Now with rectal tube. Per primary  8. Nutrition - Liberalize diet as tolerated. Add prot supp for low albumin   Tobie Poet, NP [facility] 02/19/2021,11:59 AM  LOS: 5 days    I have seen and examined this patient and agree with plan and assessment in the above note with renal recommendations/intervention highlighted.   Governor Rooks Aldean Suddeth,MD 02/19/2021 12:47 PM

## 2021-02-19 NOTE — Progress Notes (Addendum)
°  Progress Note    02/19/2021 8:22 AM 1 Day Post-Op  Subjective: No new complaints   Vitals:   02/18/21 2111 02/19/21 0533  BP: (!) 151/90 (!) 183/92  Pulse: 72 78  Resp: 18 18  Temp: 98.7 F (37.1 C) 98.5 F (36.9 C)  SpO2: 100% 100%   Physical Exam: Lungs: Nonlabored Incisions: Healthy wound bed AC fossa and upper arm wound with raw surface bleeding but no purulence Extremities: Palpable right radial pulse Neurologic: A&O  CBC    Component Value Date/Time   WBC 6.3 02/19/2021 0407   RBC 2.91 (L) 02/19/2021 0407   HGB 8.6 (L) 02/19/2021 0407   HCT 27.4 (L) 02/19/2021 0407   PLT 180 02/19/2021 0407   MCV 94.2 02/19/2021 0407   MCH 29.6 02/19/2021 0407   MCHC 31.4 02/19/2021 0407   RDW 20.4 (H) 02/19/2021 0407   LYMPHSABS 1.6 02/19/2021 0407   MONOABS 0.4 02/19/2021 0407   EOSABS 0.0 02/19/2021 0407   BASOSABS 0.0 02/19/2021 0407    BMET    Component Value Date/Time   NA 136 02/18/2021 1201   K 3.3 (L) 02/18/2021 1201   CL 97 (L) 02/18/2021 1201   CO2 24 02/18/2021 0214   GLUCOSE 81 02/18/2021 1201   BUN 6 (L) 02/18/2021 1201   CREATININE 2.70 (H) 02/18/2021 1201   CREATININE 5.66 (H) 12/22/2019 1117   CALCIUM 8.6 (L) 02/18/2021 0214   CALCIUM 9.0 03/12/2011 0527   GFRNONAA 5 (L) 02/18/2021 0214   GFRNONAA 5 (L) 02/12/2015 1000   GFRAA 9 (L) 10/22/2019 0105   GFRAA 5 (L) 02/12/2015 1000    INR    Component Value Date/Time   INR 1.2 01/24/2021 1054     Intake/Output Summary (Last 24 hours) at 02/19/2021 5284 Last data filed at 02/19/2021 0533 Gross per 24 hour  Intake 300 ml  Output 1005 ml  Net -705 ml     Assessment/Plan:  70 y.o. female is s/p debridement of right arm wounds 1 Day Post-Op   Right arm bypass remains patent with palpable radial pulse Wet-to-dry packing changed and wrapped with Ace bandage; continue daily dressing changes Intraoperative cultures pending   Dagoberto Ligas, PA-C [facility] [phone] 02/19/2021 8:22 AM  I have independently interviewed and examined patient and agree with PA assessment and plan above.   Lashone Stauber C. Donzetta Matters, MD [facility] [place] Office: [phone] Pager: [phone]

## 2021-02-20 LAB — CBC WITH DIFFERENTIAL/PLATELET
Abs Immature Granulocytes: 0.02 10*3/uL (ref 0.00–0.07)
Basophils Absolute: 0 10*3/uL (ref 0.0–0.1)
Basophils Relative: 0 %
Eosinophils Absolute: 0 10*3/uL (ref 0.0–0.5)
Eosinophils Relative: 1 %
HCT: 24.2 % — ABNORMAL LOW (ref 36.0–46.0)
Hemoglobin: 7.4 g/dL — ABNORMAL LOW (ref 12.0–15.0)
Immature Granulocytes: 0 %
Lymphocytes Relative: 25 %
Lymphs Abs: 1.3 10*3/uL (ref 0.7–4.0)
MCH: 29.1 pg (ref 26.0–34.0)
MCHC: 30.6 g/dL (ref 30.0–36.0)
MCV: 95.3 fL (ref 80.0–100.0)
Monocytes Absolute: 0.3 10*3/uL (ref 0.1–1.0)
Monocytes Relative: 5 %
Neutro Abs: 3.6 10*3/uL (ref 1.7–7.7)
Neutrophils Relative %: 69 %
Platelets: 179 10*3/uL (ref 150–400)
RBC: 2.54 MIL/uL — ABNORMAL LOW (ref 3.87–5.11)
RDW: 20.3 % — ABNORMAL HIGH (ref 11.5–15.5)
WBC: 5.2 10*3/uL (ref 4.0–10.5)
nRBC: 0 % (ref 0.0–0.2)

## 2021-02-20 LAB — RENAL FUNCTION PANEL
Albumin: 2.2 g/dL — ABNORMAL LOW (ref 3.5–5.0)
Anion gap: 11 (ref 5–15)
BUN: 41 mg/dL — ABNORMAL HIGH (ref 8–23)
CO2: 23 mmol/L (ref 22–32)
Calcium: 8.5 mg/dL — ABNORMAL LOW (ref 8.9–10.3)
Chloride: 102 mmol/L (ref 98–111)
Creatinine, Ser: 6.07 mg/dL — ABNORMAL HIGH (ref 0.44–1.00)
GFR, Estimated: 7 mL/min — ABNORMAL LOW (ref >60)
Glucose, Bld: 98 mg/dL (ref 70–99)
Phosphorus: 4.1 mg/dL (ref 2.5–4.6)
Potassium: 3.4 mmol/L — ABNORMAL LOW (ref 3.5–5.1)
Sodium: 136 mmol/L (ref 135–145)

## 2021-02-20 MED ORDER — CEFAZOLIN SODIUM-DEXTROSE 2-4 GM/100ML-% IV SOLN
2.0000 g | INTRAVENOUS | Status: DC
  Administered 2021-02-21: 2 g via INTRAVENOUS
  Filled 2021-02-20: qty 100

## 2021-02-20 NOTE — Progress Notes (Signed)
Patient ID: [name], female   DOB: 01-27-1951, 70 y.o.   MRN: [id number]  PROGRESS NOTE    RAIZA KIESEL  WCH:[id number] DOB: 1950-05-12 DOA: 02/13/2021 PCP: Hilbert Corrigan, MD   Brief Narrative:  70 y.o. female with medical history significant of ESRD on HD, atrial flutter on Eliquis, pulmonary hypertension, HIV, and GERD, recent fistula infection on vancomycin treatment with HD as an outpatient, recent admission from 01/23/2021-12/12 /2022 for syncopal episode while undergoing hemodialysis; subsequently patient was noted to be hypertensive and had seizure for which MRI revealed concern for PRES requiring Cardene drip and transferred to ICU for continuous EEG monitoring.  During hospitalization patient was noted to have a pseudoaneurysm rupture of the right arm fistula taken to the OR emergently and underwent redo of the right brachial exposure and control of hemorrhage, harvest right greater saphenous vein, right brachial brachial bypass, and application of skin substitute to the right upper extremity.  Eliquis and Plavix were initially held and she required transfusion of 2 units of packed red blood cells on 12/6.  Hemoglobin had remained stable and Eliquis only was resumed on 12/7. She presented back to the ED with bleeding from fistula; on presentation in the ED, hemoglobin was 8 with subsequent hemoglobin of 6.6.  Vascular surgery and nephrology were consulted.  Assessment & Plan:   Bleeding around AV fistula wound -Patient with recent history of ruptured pseudoaneurysm status post brachial brachial bypass with ligation of the need of brachial artery on 12/5.   - I&D of right arm postsurgical wound on 12/20 --Dressing changes as per vascular surgery.  Anticoagulation to remain on hold till cleared by vascular surgery. -Surgical wound culture in process, abx per vascular surgery -Management per vascular surgery, vascular surgery input appreciated  Acute blood loss  anemia/anemia of chronic disease from renal failure -Hemoglobin 6.6 on presentation.  Status post 1 unit packed red cell transfusion pm 12/15   Monitor H&H.  Transfuse if hemoglobin is less than 7.  End-stage renal disease on hemodialysis -Nephrology following.  Dialysis as per nephrology schedule  hypokalemia -Monitor, plan for nephrology  Atrial flutter on chronic anticoagulation -Continue metoprolol.  Currently rate controlled.  Eliquis on hold, need vascular surgery clearance for resuming , also need to monitor hemoglobin  Nausea, vomiting and diarrhea -Patient was on scheduled Senokot at the rehab.  Senokot on hold.  Monitor.  Advance diet as tolerated: Patient's oral intake has been poor - use antiemetics as needed -Stool for C. difficile is positive for antigen but negative for toxin or PCR: Possibly colonizer; GI panel PCR negative -Per previous attending plan to not treat for for C. difficile colitis since C. difficile toxin and PCR are both negative.   -Diarrhea improving.  Use Imodium as needed  HIV  -continue current antiretroviral therapy  Essential hypertension -Blood pressure on the lower side.  Continue amlodipine and metoprolol  GERD -Continue PPI  Generalized deconditioning -Patient currently from SNF and will return back to SNF once medically improves.  Social worker following  DVT prophylaxis: SCDs Code Status: DNR Family Communication: None at bedside Disposition Plan: Status is: Inpatient because: Awaiting for surgical culture, needs staple removal ,need vascular surgery clearance Consultants: Vascular surgery/nephrology  Procedures: None  Antimicrobials: Antiretrovirals  Subjective: POD#2  Right hand less edematous, denies pain at rest, nio  fever She worked with  PT today   No seizures,  no agitation,  objective: Vitals:   02/20/21 1136 02/20/21 1159 02/20/21 1207 02/20/21 1648  BP: 135/85 109/78  99/65  Pulse: 74 72 95 98  Resp: (!) 24 20   18   Temp: (!) 97.5 F (36.4 C) 97.9 F (36.6 C)  98.8 F (37.1 C)  TempSrc: Temporal   Oral  SpO2: 98% (!) 75% 100% 99%  Weight: 40 kg     Height:        Intake/Output Summary (Last 24 hours) at 02/20/2021 1915 Last data filed at 02/20/2021 1725 Gross per 24 hour  Intake 240 ml  Output -250 ml  Net 490 ml   Filed Weights   02/18/21 1050 02/20/21 0752 02/20/21 1136  Weight: 36.8 kg 39.8 kg 40 kg    Examination:  General exam: Frail , weak ,chronically ill appearing alert,communicative,calm, slow in answering question but aaox3, dialysis catheter left chest  Respiratory system: Clear to auscultation. Respiratory effort normal. Cardiovascular system:  RRR.  Gastrointestinal system: Abdomen is nondistended, soft and nontender.  Normal bowel sounds heard. Central nervous system: Alert and oriented. No focal neurological deficits. Extremities:  right arm in dressing, right hand edmatous, left arm prior avf, right leg dressing intact Skin: No rashes, lesions or ulcers Psychiatry: Judgement and insight appear normal. Mood & affect appropriate.    Data Reviewed: I have personally reviewed following labs and imaging studies  CBC: Recent Labs  Lab 02/16/21 0257 02/17/21 0400 02/18/21 0214 02/18/21 1201 02/19/21 0407 02/20/21 0318  WBC 5.6 6.4 4.6  --  6.3 5.2  NEUTROABS 3.7 4.5 3.1  --  4.2 3.6  HGB 7.7* 8.0* 7.6* 9.9* 8.6* 7.4*  HCT 23.8* 25.6* 24.1* 29.0* 27.4* 24.2*  MCV 92.2 94.8 93.8  --  94.2 95.3  PLT 193 184 176  --  180 810   Basic Metabolic Panel: Recent Labs  Lab 02/15/21 0229 02/16/21 0257 02/17/21 0400 02/18/21 0214 02/18/21 1201 02/20/21 0318  NA 136 138 138 136 136 136  K 3.2* 3.0* 3.4* 3.4* 3.3* 3.4*  CL 100 102 104 101 97* 102  CO2 27 25 24 24   --  23  GLUCOSE 95 89 79 81 81 98  BUN 18 12 21  29* 6* 41*  CREATININE 4.52* 4.27* 6.15* 7.66* 2.70* 6.07*  CALCIUM 8.6* 8.8* 8.6* 8.6*  --  8.5*  PHOS 3.9 3.6 4.1 5.6*  --  4.1   GFR: Estimated  Creatinine Clearance: 5.6 mL/min (A) (by C-G formula based on SCr of 6.07 mg/dL (H)). Liver Function Tests: Recent Labs  Lab 02/15/21 0229 02/16/21 0257 02/17/21 0400 02/18/21 0214 02/20/21 0318  ALBUMIN 2.3* 2.2* 2.2* 2.1* 2.2*   No results for input(s): LIPASE, AMYLASE in the last 168 hours. No results for input(s): AMMONIA in the last 168 hours. Coagulation Profile: No results for input(s): INR, PROTIME in the last 168 hours. Cardiac Enzymes: No results for input(s): CKTOTAL, CKMB, CKMBINDEX, TROPONINI in the last 168 hours. BNP (last 3 results) No results for input(s): PROBNP in the last 8760 hours. HbA1C: No results for input(s): HGBA1C in the last 72 hours. CBG: Recent Labs  Lab 02/16/21 2134  GLUCAP 92   Lipid Profile: No results for input(s): CHOL, HDL, LDLCALC, TRIG, CHOLHDL, LDLDIRECT in the last 72 hours. Thyroid Function Tests: No results for input(s): TSH, T4TOTAL, FREET4, T3FREE, THYROIDAB in the last 72 hours. Anemia Panel: No results for input(s): VITAMINB12, FOLATE, FERRITIN, TIBC, IRON, RETICCTPCT in the last 72 hours. Sepsis Labs: No results for input(s): PROCALCITON, LATICACIDVEN in the last 168 hours.  Recent Results (from the past 240 hour(s))  Resp Panel by RT-PCR (Flu A&B, Covid) Nasopharyngeal Swab     Status: None   Collection Time: 02/13/21  8:16 AM   Specimen: Nasopharyngeal Swab; Nasopharyngeal(NP) swabs in vial transport medium  Result Value Ref Range Status   SARS Coronavirus 2 by RT PCR NEGATIVE NEGATIVE Final    Comment: (NOTE) SARS-CoV-2 target nucleic acids are NOT DETECTED.  The SARS-CoV-2 RNA is generally detectable in upper respiratory specimens during the acute phase of infection. The lowest concentration of SARS-CoV-2 viral copies this assay can detect is 138 copies/mL. A negative result does not preclude SARS-Cov-2 infection and should not be used as the sole basis for treatment or other patient management decisions. A  negative result may occur with  improper specimen collection/handling, submission of specimen other than nasopharyngeal swab, presence of viral mutation(s) within the areas targeted by this assay, and inadequate number of viral copies(<138 copies/mL). A negative result must be combined with clinical observations, patient history, and epidemiological information. The expected result is Negative.  Fact Sheet for Patients:  EntrepreneurPulse.com.au  Fact Sheet for Healthcare Providers:  IncredibleEmployment.be  This test is no t yet approved or cleared by the [place] FDA and  has been authorized for detection and/or diagnosis of SARS-CoV-2 by FDA under an Emergency Use Authorization (EUA). This EUA will remain  in effect (meaning this test can be used) for the duration of the COVID-19 declaration under Section 564(b)(1) of the Act, 21 U.S.C.section 360bbb-3(b)(1), unless the authorization is terminated  or revoked sooner.       Influenza A by PCR NEGATIVE NEGATIVE Final   Influenza B by PCR NEGATIVE NEGATIVE Final    Comment: (NOTE) The Xpert Xpress SARS-CoV-2/FLU/RSV plus assay is intended as an aid in the diagnosis of influenza from Nasopharyngeal swab specimens and should not be used as a sole basis for treatment. Nasal washings and aspirates are unacceptable for Xpert Xpress SARS-CoV-2/FLU/RSV testing.  Fact Sheet for Patients: EntrepreneurPulse.com.au  Fact Sheet for Healthcare Providers: IncredibleEmployment.be  This test is not yet approved or cleared by the [place] FDA and has been authorized for detection and/or diagnosis of SARS-CoV-2 by FDA under an Emergency Use Authorization (EUA). This EUA will remain in effect (meaning this test can be used) for the duration of the COVID-19 declaration under Section 564(b)(1) of the Act, 21 U.S.C. section 360bbb-3(b)(1), unless the authorization  is terminated or revoked.  Performed at [facility], [address], [place], [place] [address]   C Difficile Quick Screen w PCR reflex     Status: Abnormal   Collection Time: 02/14/21  4:12 PM   Specimen: STOOL  Result Value Ref Range Status   C Diff antigen POSITIVE (A) NEGATIVE Final    Comment: CRITICAL RESULT CALLED TO, READ BACK BY AND VERIFIED WITH: K. MASHORE RN 02/14/2021 @1949  [place] JW    C Diff toxin NEGATIVE NEGATIVE Final   C Diff interpretation Results are indeterminate. See PCR results.  Final    Comment: Performed at [facility], [place] [address], [place], [place] [address]  Gastrointestinal Panel by PCR , Stool     Status: None   Collection Time: 02/14/21  4:12 PM   Specimen: Stool  Result Value Ref Range Status   Campylobacter species NOT DETECTED NOT DETECTED Final   Plesimonas shigelloides NOT DETECTED NOT DETECTED Final   Salmonella species NOT DETECTED NOT DETECTED Final   Yersinia enterocolitica NOT DETECTED NOT DETECTED Final   Vibrio species NOT DETECTED NOT DETECTED  Final   Vibrio cholerae NOT DETECTED NOT DETECTED Final   Enteroaggregative E coli (EAEC) NOT DETECTED NOT DETECTED Final   Enteropathogenic E coli (EPEC) NOT DETECTED NOT DETECTED Final   Enterotoxigenic E coli (ETEC) NOT DETECTED NOT DETECTED Final   Shiga like toxin producing E coli (STEC) NOT DETECTED NOT DETECTED Final   Shigella/Enteroinvasive E coli (EIEC) NOT DETECTED NOT DETECTED Final   Cryptosporidium NOT DETECTED NOT DETECTED Final   Cyclospora cayetanensis NOT DETECTED NOT DETECTED Final   Entamoeba histolytica NOT DETECTED NOT DETECTED Final   Giardia lamblia NOT DETECTED NOT DETECTED Final   Adenovirus F40/41 NOT DETECTED NOT DETECTED Final   Astrovirus NOT DETECTED NOT DETECTED Final   Norovirus GI/GII NOT DETECTED NOT DETECTED Final   Rotavirus A NOT DETECTED NOT DETECTED Final   Sapovirus (I, II, IV, and V) NOT DETECTED NOT DETECTED Final    Comment: Performed at  [facility], [address], [place], [place] [address]  C. Diff by PCR, Reflexed     Status: None   Collection Time: 02/14/21  4:12 PM  Result Value Ref Range Status   Toxigenic C. Difficile by PCR NEGATIVE NEGATIVE Final    Comment: Patient is colonized with non toxigenic C. difficile. May not need treatment unless significant symptoms are present. Performed at [facility], [place] [address] [place], [place] [address]   Aerobic/Anaerobic Culture w Gram Stain (surgical/deep wound)     Status: None (Preliminary result)   Collection Time: 02/18/21  1:00 PM   Specimen: Wound  Result Value Ref Range Status   Specimen Description WOUND  Final   Special Requests RIGHT ARM SPEC A  Final   Gram Stain   Final    FEW WBC PRESENT, PREDOMINANTLY MONONUCLEAR RARE GRAM POSITIVE RODS Performed at [facility], [address], [place], [place] [address]    Culture   Final    MODERATE ESCHERICHIA COLI NO ANAEROBES ISOLATED; CULTURE IN PROGRESS FOR 5 DAYS    Report Status PENDING  Incomplete   Organism ID, Bacteria ESCHERICHIA COLI  Final      Susceptibility   Escherichia coli - MIC*    AMPICILLIN >=32 RESISTANT Resistant     CEFAZOLIN <=4 SENSITIVE Sensitive     CEFEPIME <=0.12 SENSITIVE Sensitive     CEFTAZIDIME <=1 SENSITIVE Sensitive     CEFTRIAXONE <=0.25 SENSITIVE Sensitive     CIPROFLOXACIN >=4 RESISTANT Resistant     GENTAMICIN <=1 SENSITIVE Sensitive     IMIPENEM <=0.25 SENSITIVE Sensitive     TRIMETH/SULFA <=20 SENSITIVE Sensitive     AMPICILLIN/SULBACTAM >=32 RESISTANT Resistant     PIP/TAZO <=4 SENSITIVE Sensitive     * MODERATE ESCHERICHIA COLI  MRSA Next Gen by PCR, Nasal     Status: None   Collection Time: 02/19/21  1:18 PM   Specimen: Nasal Mucosa; Nasal Swab  Result Value Ref Range Status   MRSA by PCR Next Gen NOT DETECTED NOT DETECTED Final    Comment: (NOTE) The GeneXpert MRSA Assay (FDA approved for NASAL specimens only), is one component of a  comprehensive MRSA colonization surveillance program. It is not intended to diagnose MRSA infection nor to guide or monitor treatment for MRSA infections. Test performance is not FDA approved in patients less than 59 years old. Performed at [facility], [place] [address] [place], [place] [address]           Radiology Studies: No results found.  Scheduled Meds:  (feeding supplement) PROSource Plus  30 mL Oral BID BM   amLODipine  10 mg Oral Daily   vitamin C  500 mg Oral BID   bictegravir-emtricitabine-tenofovir AF  1 tablet Oral Daily   calcitRIOL  1.25 mcg Oral Q T,Th,Sa-HD   Chlorhexidine Gluconate Cloth  6 each Topical Daily   [START ON 02/22/2021] darbepoetin (ARANESP) injection - DIALYSIS  100 mcg Intravenous Q Sat-HD   lacosamide  150 mg Oral BID   levETIRAcetam  1,000 mg Oral Daily   levETIRAcetam  250 mg Oral Q T,Th,Sat-1800   megestrol  400 mg Oral Daily   metoprolol tartrate  25 mg Oral BID   mirtazapine  15 mg Oral QHS   nystatin  5 mL Oral QID   ondansetron  4 mg Oral Q6H   pantoprazole  40 mg Oral QAC breakfast   saccharomyces boulardii  250 mg Oral BID   thiamine  100 mg Oral Daily   venlafaxine  75 mg Oral BID   Continuous Infusions:           Florencia Reasons, MD PhD FACP [facility] 02/20/2021, 7:15 PM

## 2021-02-20 NOTE — Progress Notes (Addendum)
°  Progress Note    02/20/2021 7:45 AM 2 Days Post-Op  Subjective:  No new complaints   Vitals:   02/19/21 2116 02/20/21 0328  BP: 123/62 129/74  Pulse: 82 95  Resp: 18 20  Temp: 98.2 F (36.8 C) 98.5 F (36.9 C)  SpO2: 100% 99%   Physical Exam: Lungs:  non labored Incisions:  R leg incision c/d/I; R arm wounds with healthy wound bed, no further purulence; palpable radial pulse Neurologic: A&O  CBC    Component Value Date/Time   WBC 5.2 02/20/2021 0318   RBC 2.54 (L) 02/20/2021 0318   HGB 7.4 (L) 02/20/2021 0318   HCT 24.2 (L) 02/20/2021 0318   PLT 179 02/20/2021 0318   MCV 95.3 02/20/2021 0318   MCH 29.1 02/20/2021 0318   MCHC 30.6 02/20/2021 0318   RDW 20.3 (H) 02/20/2021 0318   LYMPHSABS 1.3 02/20/2021 0318   MONOABS 0.3 02/20/2021 0318   EOSABS 0.0 02/20/2021 0318   BASOSABS 0.0 02/20/2021 0318    BMET    Component Value Date/Time   NA 136 02/20/2021 0318   K 3.4 (L) 02/20/2021 0318   CL 102 02/20/2021 0318   CO2 23 02/20/2021 0318   GLUCOSE 98 02/20/2021 0318   BUN 41 (H) 02/20/2021 0318   CREATININE 6.07 (H) 02/20/2021 0318   CREATININE 5.66 (H) 12/22/2019 1117   CALCIUM 8.5 (L) 02/20/2021 0318   CALCIUM 9.0 03/12/2011 0527   GFRNONAA 7 (L) 02/20/2021 0318   GFRNONAA 5 (L) 02/12/2015 1000   GFRAA 9 (L) 10/22/2019 0105   GFRAA 5 (L) 02/12/2015 1000    INR    Component Value Date/Time   INR 1.2 01/24/2021 1054     Intake/Output Summary (Last 24 hours) at 02/20/2021 0745 Last data filed at 02/19/2021 2116 Gross per 24 hour  Intake 714 ml  Output 0 ml  Net 714 ml     Assessment/Plan:  70 y.o. female is s/p R arm bypass with subsequent debridement of R arm wounds 2 Days Post-Op   R hand well perfused with palpable radial pulse Wet to dry packing R arm wounds daily followed by abd pad and ACE wrap Remove staples from R thigh incision prior to discharge Ok to return to SNF once intra-op cultures result; looks like gram negative rods on  preliminary   Dagoberto Ligas, PA-C [facility] [phone] 02/20/2021 7:45 AM  I have independently interviewed and examined patient and agree with PA assessment and plan above.   Lyrical Sowle C. Donzetta Matters, MD [facility] [place] Office: [phone] Pager: [phone]

## 2021-02-20 NOTE — Plan of Care (Signed)
°  Problem: Education: Goal: Knowledge of General Education information will improve Description: Including pain rating scale, medication(s)/side effects and non-pharmacologic comfort measures Outcome: Progressing   Problem: Clinical Measurements: Goal: Will remain free from infection Outcome: Progressing Goal: Diagnostic test results will improve Outcome: Progressing   Problem: Activity: Goal: Risk for activity intolerance will decrease Outcome: Progressing   Problem: Nutrition: Goal: Adequate nutrition will be maintained Outcome: Progressing   Problem: Coping: Goal: Level of anxiety will decrease Outcome: Progressing   Problem: Education: Goal: Knowledge of disease and its progression will improve Outcome: Progressing   Problem: Fluid Volume: Goal: Compliance with measures to maintain balanced fluid volume will improve Outcome: Progressing   Problem: Health Behavior/Discharge Planning: Goal: Ability to manage health-related needs will improve Outcome: Progressing   Problem: Nutritional: Goal: Ability to make healthy dietary choices will improve Outcome: Progressing

## 2021-02-20 NOTE — Procedures (Signed)
I was present at this dialysis session. I have reviewed the session itself and made appropriate changes.   Vital signs in last 24 hours:  Temp:  [98 F (36.7 C)-98.5 F (36.9 C)] 98 F (36.7 C) (12/22 0752) Pulse Rate:  [78-98] 78 (12/22 0815) Resp:  [17-20] 17 (12/22 0752) BP: (121-139)/(62-86) 121/76 (12/22 0815) SpO2:  [99 %-100 %] 99 % (12/22 0752) Weight:  [39.8 kg] 39.8 kg (12/22 0752) Weight change:  Filed Weights   02/18/21 0700 02/18/21 1050 02/20/21 0752  Weight: 38.9 kg 36.8 kg 39.8 kg    Recent Labs  Lab 02/20/21 0318  NA 136  K 3.4*  CL 102  CO2 23  GLUCOSE 98  BUN 41*  CREATININE 6.07*  CALCIUM 8.5*  PHOS 4.1    Recent Labs  Lab 02/18/21 0214 02/18/21 1201 02/19/21 0407 02/20/21 0318  WBC 4.6  --  6.3 5.2  NEUTROABS 3.1  --  4.2 3.6  HGB 7.6* 9.9* 8.6* 7.4*  HCT 24.1* 29.0* 27.4* 24.2*  MCV 93.8  --  94.2 95.3  PLT 176  --  180 179    Scheduled Meds:  (feeding supplement) PROSource Plus  30 mL Oral BID BM   amLODipine  10 mg Oral Daily   vitamin C  500 mg Oral BID   bictegravir-emtricitabine-tenofovir AF  1 tablet Oral Daily   calcitRIOL  1.25 mcg Oral Q T,Th,Sa-HD   Chlorhexidine Gluconate Cloth  6 each Topical Daily   [START ON 02/22/2021] darbepoetin (ARANESP) injection - DIALYSIS  100 mcg Intravenous Q Sat-HD   lacosamide  150 mg Oral BID   levETIRAcetam  1,000 mg Oral Daily   levETIRAcetam  250 mg Oral Q T,Th,Sat-1800   megestrol  400 mg Oral Daily   metoprolol tartrate  25 mg Oral BID   mirtazapine  15 mg Oral QHS   nystatin  5 mL Oral QID   ondansetron  4 mg Oral Q6H   pantoprazole  40 mg Oral QAC breakfast   saccharomyces boulardii  250 mg Oral BID   thiamine  100 mg Oral Daily   venlafaxine  75 mg Oral BID   Continuous Infusions:  sodium chloride     sodium chloride     PRN Meds:.sodium chloride, sodium chloride, acetaminophen **OR** acetaminophen, albuterol, alteplase, cetaphil, heparin, heparin, HYDROmorphone (DILAUDID)  injection, lidocaine (PF), lidocaine-prilocaine, loperamide, metoCLOPramide (REGLAN) injection, oxyCODONE-acetaminophen, pentafluoroprop-tetrafluoroeth   Donetta Potts,  MD 02/20/2021, 8:25 AM

## 2021-02-20 NOTE — Evaluation (Signed)
Physical Therapy Evaluation Patient Details Name: [name] MRN: [id number] DOB: 09-22-1950 Today's Date: 02/20/2021  History of Present Illness  70 yo female admitted 01/23/21 after syncope at HD. 11/25 pt with HTN and seizure with LTM EEG. Pt with PRES. 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 PMhx: HTN, Aflutter, pulmonary HTN, HIV, GERD, chronic pain, ESRD on HD TTS.  Clinical Impression  Pt presents to PT with deficits in strength, power, balance, gait, functional mobility. Pt with increased lateral sway and general instability in standing at this time. Pt requires continued support during ambulation and will benefit from use of a walker vs cane to improve balance upon returning to SNF. Pt will benefit from continued aggressive mobilization in an effort to reduce falls risk and restore the pt's prior level of function.       Recommendations for follow up therapy are one component of a multi-disciplinary discharge planning process, led by the attending physician.  Recommendations may be updated based on patient status, additional functional criteria and insurance authorization.  Follow Up Recommendations Skilled nursing-short term rehab (<3 hours/day)    Assistance Recommended at Discharge Frequent or constant Supervision/Assistance  Functional Status Assessment Patient has had a recent decline in their functional status and demonstrates the ability to make significant improvements in function in a reasonable and predictable amount of time.  Equipment Recommendations  None recommended by PT    Recommendations for Other Services       Precautions / Restrictions Precautions Precautions: Fall Restrictions Weight Bearing Restrictions: No      Mobility  Bed Mobility Overal bed mobility: Needs Assistance Bed Mobility: Rolling;Sidelying to Sit;Sit to Sidelying Rolling: Supervision Sidelying to sit: Min guard;HOB elevated     Sit to sidelying:  Min guard General bed mobility comments: verbal cues for technique when returning to sidelying    Transfers Overall transfer level: Needs assistance Equipment used: 1 person hand held assist Transfers: Sit to/from Stand Sit to Stand: Min assist                Ambulation/Gait Ambulation/Gait assistance: Min assist Gait Distance (Feet): 30 Feet Assistive device: 1 person hand held assist Gait Pattern/deviations: Step-to pattern Gait velocity: reduced Gait velocity interpretation: <1.31 ft/sec, indicative of household ambulator   General Gait Details: pt with slowed step-to gait, increased lateral and A/P sway  Stairs            Wheelchair Mobility    Modified Rankin (Stroke Patients Only)       Balance Overall balance assessment: Needs assistance Sitting-balance support: No upper extremity supported;Feet supported Sitting balance-Leahy Scale: Fair     Standing balance support: Single extremity supported Standing balance-Leahy Scale: Poor Standing balance comment: minA with LUE hand hold                             Pertinent Vitals/Pain Pain Assessment: Faces Faces Pain Scale: Hurts even more Pain Location: RUE, R thigh and groin Pain Descriptors / Indicators: Sore Pain Intervention(s): Monitored during session    Home Living Family/patient expects to be discharged to:: Skilled nursing facility                   Additional Comments: pt resides at [facility]    Prior Function Prior Level of Function : Independent/Modified Independent             Mobility Comments: pt reports ambulating some without  device, utilizes wheelchair for longer distances of mobility       Hand Dominance   Dominant Hand: Right    Extremity/Trunk Assessment   Upper Extremity Assessment Upper Extremity Assessment: RUE deficits/detail RUE Deficits / Details: pt is able to actively extend R wrist to ~-5 degrees from neutral, AAROM to  neutral. Pt lacking elbow flexion ROM, partially due to wrapping of wound near elbow.    Lower Extremity Assessment Lower Extremity Assessment: Generalized weakness    Cervical / Trunk Assessment Cervical / Trunk Assessment: Kyphotic  Communication   Communication: Expressive difficulties  Cognition Arousal/Alertness: Awake/alert Behavior During Therapy: Flat affect Overall Cognitive Status: No family/caregiver present to determine baseline cognitive functioning                                 General Comments: pt reports some mild confusion but is oriented to person place, month and situation. Pt follows commands well and is aware of her mobility deficits        General Comments General comments (skin integrity, edema, etc.): VSS on RA    Exercises     Assessment/Plan    PT Assessment Patient needs continued PT services  PT Problem List Decreased strength;Decreased range of motion;Decreased activity tolerance;Decreased balance;Decreased mobility;Decreased knowledge of use of DME;Decreased safety awareness;Decreased knowledge of precautions;Pain       PT Treatment Interventions DME instruction;Gait training;Functional mobility training;Therapeutic activities;Therapeutic exercise;Balance training;Neuromuscular re-education;Patient/family education    PT Goals (Current goals can be found in the Care Plan section)  Acute Rehab PT Goals Patient Stated Goal: to return to independent mobility PT Goal Formulation: With patient Time For Goal Achievement: 03/06/21 Potential to Achieve Goals: Fair    Frequency Min 2X/week   Barriers to discharge        Co-evaluation               AM-PAC PT "6 Clicks" Mobility  Outcome Measure Help needed turning from your back to your side while in a flat bed without using bedrails?: A Little Help needed moving from lying on your back to sitting on the side of a flat bed without using bedrails?: A Little Help needed  moving to and from a bed to a chair (including a wheelchair)?: A Little Help needed standing up from a chair using your arms (e.g., wheelchair or bedside chair)?: A Little Help needed to walk in hospital room?: A Little Help needed climbing 3-5 steps with a railing? : Total 6 Click Score: 16    End of Session   Activity Tolerance: Patient tolerated treatment well Patient left: in bed;with call bell/phone within reach;with bed alarm set Nurse Communication: Mobility status PT Visit Diagnosis: Muscle weakness (generalized) (M62.81);Other abnormalities of gait and mobility (R26.89);Difficulty in walking, not elsewhere classified (R26.2) Pain - Right/Left: Right Pain - part of body: Arm    Time: 1425-1455 PT Time Calculation (min) (ACUTE ONLY): 30 min   Charges:   PT Evaluation $PT Eval Low Complexity: 1 Low          Zenaida Niece, PT, DPT Acute Rehabilitation Pager: [phone] Office [phone]   Zenaida Niece 02/20/2021, 3:12 PM

## 2021-02-20 NOTE — Plan of Care (Signed)
°  Problem: Education: Goal: Knowledge of General Education information will improve Description: Including pain rating scale, medication(s)/side effects and non-pharmacologic comfort measures Outcome: Progressing   Problem: Clinical Measurements: Goal: Diagnostic test results will improve Outcome: Progressing   Problem: Coping: Goal: Level of anxiety will decrease Outcome: Progressing   Problem: Education: Goal: Knowledge of disease and its progression will improve Outcome: Progressing   Problem: Fluid Volume: Goal: Compliance with measures to maintain balanced fluid volume will improve Outcome: Progressing   Problem: Health Behavior/Discharge Planning: Goal: Ability to manage health-related needs will improve Outcome: Progressing   Problem: Nutritional: Goal: Ability to make healthy dietary choices will improve Outcome: Progressing

## 2021-02-21 LAB — CBC
HCT: 23.4 % — ABNORMAL LOW (ref 36.0–46.0)
Hemoglobin: 7.2 g/dL — ABNORMAL LOW (ref 12.0–15.0)
MCH: 29.6 pg (ref 26.0–34.0)
MCHC: 30.8 g/dL (ref 30.0–36.0)
MCV: 96.3 fL (ref 80.0–100.0)
Platelets: 158 10*3/uL (ref 150–400)
RBC: 2.43 MIL/uL — ABNORMAL LOW (ref 3.87–5.11)
RDW: 21 % — ABNORMAL HIGH (ref 11.5–15.5)
WBC: 5.1 10*3/uL (ref 4.0–10.5)
nRBC: 0 % (ref 0.0–0.2)

## 2021-02-21 LAB — RESP PANEL BY RT-PCR (FLU A&B, COVID) ARPGX2
Influenza A by PCR: NEGATIVE
Influenza B by PCR: NEGATIVE
SARS Coronavirus 2 by RT PCR: NEGATIVE

## 2021-02-21 MED ORDER — SENNOSIDES-DOCUSATE SODIUM 8.6-50 MG PO TABS: 1.0000 | ORAL_TABLET | Freq: Two times a day (BID) | ORAL | Status: AC

## 2021-02-21 MED ORDER — SACCHAROMYCES BOULARDII 250 MG PO CAPS: 250.0000 mg | ORAL_CAPSULE | Freq: Two times a day (BID) | ORAL | Status: AC

## 2021-02-21 MED ORDER — OXYCODONE-ACETAMINOPHEN 5-325 MG PO TABS: 1.0000 | ORAL_TABLET | Freq: Four times a day (QID) | ORAL | 0 refills | Status: DC | PRN

## 2021-02-21 MED ORDER — SODIUM CHLORIDE 0.9 % IV SOLN: 2.0000 g | INTRAVENOUS | 0 refills | Status: AC

## 2021-02-21 MED ORDER — APIXABAN 2.5 MG PO TABS
2.5000 mg | ORAL_TABLET | Freq: Two times a day (BID) | ORAL | Status: DC
  Administered 2021-02-21: 2.5 mg via ORAL
  Filled 2021-02-21: qty 1

## 2021-02-21 MED ORDER — SENNOSIDES-DOCUSATE SODIUM 8.6-50 MG PO TABS
1.0000 | ORAL_TABLET | Freq: Two times a day (BID) | ORAL | Status: DC
  Administered 2021-02-21: 1 via ORAL
  Filled 2021-02-21: qty 1

## 2021-02-21 NOTE — TOC Progression Note (Addendum)
Transition of Care [facility]) - Progression Note    Patient Details  Name: MAKAHLA KISER MRN: [id number] Date of Birth: March 02, 1951  Transition of Care [facility]) CM/SW [place], [place] Phone Number: 02/21/2021, 9:54 AM  Clinical Narrative:    9:54am-CSW left voicemail for Admissions at [facility] to see if patient requires a COVID test prior to return.   9:57am-Per Melissa, no need for COVID test. CSW made her aware of change in HD time for tomorrow. CSW made patient's daughter aware of plan.   Expected Discharge Plan: Long Term Nursing Home Barriers to Discharge: Continued Medical Work up  Expected Discharge Plan and Services Expected Discharge Plan: [place]       Living arrangements for the past 2 months: [place]                                       Social Determinants of Health (SDOH) Interventions    Readmission Risk Interventions Readmission Risk Prevention Plan 10/22/2019  Transportation Screening Complete  PCP or Specialist Appt within 3-5 Days Not Complete  Not Complete comments SNF MD to follow  [place] or [place] Not Complete  HRI or Home Care Consult comments Pt resides in a SNF  Social Work Consult for [place] Planning/Counseling Complete  Palliative Care Screening Not Applicable  Medication Review Press photographer) Complete  Some recent data might be hidden

## 2021-02-21 NOTE — Progress Notes (Addendum)
D/C order noted. Contacted [facility] and spoke to Joy, Therapist, sports. Clinic advised of p'ts d/c to snf today and to resume care tomorrow. Clinic operating on a different schedule tomorrow due to holiday. Pt will need to arrive at 9:45 tomorrow due to holiday schedule. CSW made aware of this info to inform snf. Will fax d/c summary to clinic once completed for continuation of care.    Melven Sartorius Renal Navigator [phone]  Addendum at 12:18 pm: D/C summary faxed to HD clinic. Renal NP to contact pt's clinic regarding pt's iv abx needs at d/c.

## 2021-02-21 NOTE — Progress Notes (Signed)
Patient is discharging to [facility], gave report to April staff nurse and answered all her questions.  Waiting for PTAR to pick the patient.

## 2021-02-21 NOTE — NC FL2 (Signed)
[place] MEDICAID FL2 LEVEL OF CARE SCREENING TOOL     IDENTIFICATION  Patient Name: [name] Birthdate: August 22, 1950 Sex: female Admission Date (Current Location): 02/13/2021  [facility] and [place] Number:  [employer] and Address:  The . [facility] [place] [address] [place], [place] [address]      Provider Number: [id number]  Attending Physician Name and Address:  Florencia Reasons, MD  Relative Name and Phone Number:  [place], [place] [phone]    Current Level of Care: Hospital Recommended Level of Care: [place] Prior Approval Number:    Date Approved/Denied:   PASRR Number: [id number]  Discharge Plan: SNF    Current Diagnoses: Patient Active Problem List   Diagnosis Date Noted   Bleeding from wound 02/13/2021   Acute blood loss anemia 02/13/2021   Thrombocytopenia ([place]) 01/30/2021   Hyponatremia 01/30/2021   Acute delirium    Hyperthyroidism 01/25/2021   Seizure ([place]) 01/25/2021   MRSA infection 01/25/2021   PRES (posterior reversible encephalopathy syndrome)    Hypertensive emergency    Hypoglycemia 12/25/2020   Infected fluid collection with fistula 12/24/2020   Dysphagia 12/16/2020   Loss of weight 12/16/2020   Abdominal pain, epigastric 12/16/2020   Protein-calorie malnutrition, severe 11/20/2020   Chest pain of uncertain etiology    Altered mental status    ESRD (end stage renal disease) ([place]) 11/11/2020   Pancreatic abnormality 08/05/2020   Gastritis without bleeding 08/05/2020   Dilated pancreatic duct 05/15/2020   Abnormal MRI of abdomen 05/15/2020   Common bile duct dilation    Lobar pneumonia ([place]) 07/20/2019   Acute respiratory failure with hypoxia ([place]) 07/20/2019   Colitis 07/19/2019   Dilation of common bile duct 07/19/2019   Ventral hernia without obstruction or gangrene    Hypocalcemia 03/07/2019   Other pancytopenia ([place]) 12/08/2018   Macrocytic anemia 04/17/2016   Hepatitis B immune  01/28/2016   Atrial flutter ([place]) 02/22/2015   Hyperkalemia 02/20/2015   Venous stenosis of right upper extremity 04/06/2014   ESRD on dialysis ([place]) 04/06/2014   Essential hypertension    Nausea, vomiting, and diarrhea 12/15/2012   CHF, chronic ([place]) 11/25/2012   Coronary artery disease 09/15/2012   History of stroke 09/15/2012   Anxiety 09/15/2012   Atrial fibrillation ([place]) 05/02/2012   Goiter 04/08/2012   Intracranial bleed ([place]) 12/28/2011   Angioedema of lips 09/21/2011   Cellulitis of breast 03/09/2011   Erosive esophagitis 12/15/2010   Mallory - Weiss tear 12/15/2010   UGI bleed 12/13/2010   DIARRHEA 04/12/2009   Human immunodeficiency virus (HIV) disease ([place]) 11/15/2008   PANCREATITIS, HX OF 11/15/2008   TOBACCO USE, QUIT 11/15/2008   PAIN IN JOINT, UPPER ARM 08/07/2008   ABDOMINAL WALL HERNIA 04/26/2007   BRONCHITIS, ACUTE 11/16/2006   HYPOKALEMIA, HX OF 07/13/2006   ANEMIA, NORMOCYTIC, CHRONIC 03/23/2006   DEPRESSION 03/23/2006   Gastroesophageal reflux disease 03/23/2006   PANCREATITIS 03/23/2006   MASS, RIGHT AXILLA 03/23/2006   HEADACHE 03/23/2006   SYMPTOM, ENLARGEMENT, LYMPH NODES 03/23/2006   SHINGLES, HX OF 03/23/2006   HYSTERECTOMY, TOTAL, HX OF 03/23/2006    Orientation RESPIRATION BLADDER Height & Weight     Self, Time, Situation, Place  Normal Continent Weight: 89 lb 8.1 oz (40.6 kg) Height:  5\' 2"  (157.5 cm)  BEHAVIORAL SYMPTOMS/MOOD NEUROLOGICAL BOWEL NUTRITION STATUS      Continent Diet (see dc summary)  AMBULATORY STATUS COMMUNICATION OF NEEDS Skin   Extensive Assist Verbally Surgical wounds (closed incision  on arm)                       Personal Care Assistance Level of Assistance  Bathing, Feeding, Dressing Bathing Assistance: Maximum assistance Feeding assistance: Independent Dressing Assistance: Maximum assistance     Functional Limitations Info  Sight, Hearing, Speech Sight Info: Adequate Hearing Info: Impaired Speech  Info: Adequate    SPECIAL CARE FACTORS FREQUENCY        PT Frequency: 5x/week OT Frequency: 5x/week            Contractures Contractures Info: Not present    Additional Factors Info  Code Status, Allergies Code Status Info: DNR Allergies Info: Lactose Intolerance (Gi), Latex, Penicillins           Current Medications (02/21/2021):  This is the current hospital active medication list Current Facility-Administered Medications  Medication Dose Route Frequency Provider Last Rate Last Admin   (feeding supplement) PROSource Plus liquid 30 mL  30 mL Oral BID BM Lynnda Child, PA-C   30 mL at 02/21/21 1962   acetaminophen (TYLENOL) tablet 650 mg  650 mg Oral Q6H PRN Norval Morton, MD       Or   acetaminophen (TYLENOL) suppository 650 mg  650 mg Rectal Q6H PRN Fuller Plan A, MD       albuterol (PROVENTIL) (2.5 MG/3ML) 0.083% nebulizer solution 2.5 mg  2.5 mg Nebulization Q6H PRN Tamala Julian, Rondell A, MD       amLODipine (NORVASC) tablet 10 mg  10 mg Oral Daily Tamala Julian, Rondell A, MD   10 mg at 02/21/21 0957   apixaban (ELIQUIS) tablet 2.5 mg  2.5 mg Oral BID Florencia Reasons, MD   2.5 mg at 02/21/21 2297   ascorbic acid (VITAMIN C) tablet 500 mg  500 mg Oral BID Fuller Plan A, MD   500 mg at 02/21/21 0957   bictegravir-emtricitabine-tenofovir AF (BIKTARVY) 50-200-25 MG per tablet 1 tablet  1 tablet Oral Daily Laurice Record, MD   1 tablet at 02/20/21 1724   calcitRIOL (ROCALTROL) capsule 1.25 mcg  1.25 mcg Oral Q T,Th,Sa-HD Lynnda Child, PA-C   1.25 mcg at 02/20/21 9892   ceFAZolin (ANCEF) IVPB 2g/100 mL premix  2 g Intravenous Q T,Th,Sat-1800 Jennette Kettle M, DO 200 mL/hr at 02/21/21 0008 2 g at 02/21/21 0008   cetaphil lotion 1 application  1 application Topical J19E PRN Fuller Plan A, MD       Chlorhexidine Gluconate Cloth 2 % PADS 6 each  6 each Topical Daily Aline August, MD   6 each at 02/21/21 0959   [START ON 02/22/2021] Darbepoetin Alfa (ARANESP) injection  100 mcg  100 mcg Intravenous Q Sat-HD Tobie Poet E, NP       HYDROmorphone (DILAUDID) injection 0.5 mg  0.5 mg Intravenous Q3H PRN Dagoberto Ligas, PA-C   0.5 mg at 02/20/21 0946   lacosamide (VIMPAT) tablet 150 mg  150 mg Oral BID Fuller Plan A, MD   150 mg at 02/21/21 0958   levETIRAcetam (KEPPRA) tablet 1,000 mg  1,000 mg Oral Daily Tamala Julian, Rondell A, MD   1,000 mg at 02/21/21 0957   levETIRAcetam (KEPPRA) tablet 250 mg  250 mg Oral Q T,Th,Sat-1800 Smith, Rondell A, MD   250 mg at 02/20/21 1731   loperamide (IMODIUM) capsule 2 mg  2 mg Oral Q6H PRN Aline August, MD   2 mg at 02/15/21 2208   megestrol (MEGACE) 400 MG/10ML suspension 400 mg  400  mg Oral Daily Fuller Plan A, MD   400 mg at 02/21/21 0958   metoCLOPramide (REGLAN) injection 10 mg  10 mg Intravenous Q6H PRN Aline August, MD   10 mg at 02/20/21 1115   metoprolol tartrate (LOPRESSOR) tablet 25 mg  25 mg Oral BID Fuller Plan A, MD   25 mg at 02/21/21 1006   mirtazapine (REMERON) tablet 15 mg  15 mg Oral QHS Smith, Rondell A, MD   15 mg at 02/20/21 2355   nystatin (MYCOSTATIN) 100000 UNIT/ML suspension 500,000 Units  5 mL Oral QID Fuller Plan A, MD   500,000 Units at 02/21/21 0958   ondansetron (ZOFRAN) tablet 4 mg  4 mg Oral Q6H Alekh, Kshitiz, MD   4 mg at 02/21/21 1308   oxyCODONE-acetaminophen (PERCOCET/ROXICET) 5-325 MG per tablet 1-2 tablet  1-2 tablet Oral Q4H PRN Dagoberto Ligas, PA-C   2 tablet at 02/20/21 2353   pantoprazole (PROTONIX) EC tablet 40 mg  40 mg Oral QAC breakfast Fuller Plan A, MD   40 mg at 02/21/21 6578   saccharomyces boulardii (FLORASTOR) capsule 250 mg  250 mg Oral BID Fuller Plan A, MD   250 mg at 02/21/21 4696   senna-docusate (Senokot-S) tablet 1 tablet  1 tablet Oral BID Florencia Reasons, MD       thiamine tablet 100 mg  100 mg Oral Daily Fuller Plan A, MD   100 mg at 02/21/21 2952   venlafaxine [facility]) tablet 75 mg  75 mg Oral BID Norval Morton, MD   75 mg at 02/21/21 8413    Facility-Administered Medications Ordered in Other Encounters  Medication Dose Route Frequency Provider Last Rate Last Admin   acetaminophen (TYLENOL) tablet 650 mg  650 mg Oral Once Lockamy, Randi L, NP-C       diphenhydrAMINE (BENADRYL) capsule 25 mg  25 mg Oral Once Lockamy, Randi L, NP-C       sodium chloride flush (NS) 0.9 % injection 10 mL  10 mL Intracatheter PRN Lockamy, Randi L, NP-C         Discharge Medications: Please see discharge summary for a list of discharge medications.  Relevant Imaging Results:  Relevant Lab Results:   Additional Information SS# [place]  HD on [place], LCSW

## 2021-02-21 NOTE — Progress Notes (Signed)
[name] to be discharged [place] per MD order. Patient verbalized understanding.  Skin clean, dry and intact without evidence of skin break down, no evidence of skin tears noted. IV catheter discontinued intact. Site without signs and symptoms of complications. Dressing and pressure applied. Pt denies pain at the site currently. No complaints noted.  Patient free of lines, drains, and wounds.   Discharge packet assembled. An After Visit Summary (AVS) was printed and given to the EMS personnel. Patient escorted via stretcher and discharged to [facility] via ambulance. Report called to accepting facility; all questions and concerns addressed.   Amaryllis Dyke, RN

## 2021-02-21 NOTE — Progress Notes (Signed)
[facility] Progress Note   Subjective:    Seen and examined patient at bedside. No acute events. Tolerated HD yesterday, net positive 250cc, post weight 40kg. No complaints.  Objective Vitals:   02/20/21 1648 02/20/21 1945 02/21/21 0448 02/21/21 0540  BP: 99/65 113/64 116/66   Pulse: 98 95 73   Resp: 18 16 16    Temp: 98.8 F (37.1 C) 98.5 F (36.9 C) 99 F (37.2 C)   TempSrc: Oral Oral Oral   SpO2: 99% 99% 98%   Weight:    40.6 kg  Height:       Physical Exam General: Ill-appearing; NAD Heart: Normal S1 and S2; No murmurs, gallops, or rubs Lungs: Clear throughout; No wheezing, rales, or rhonchi Abdomen: Soft, non-tender, active bowel sounds Extremities: No edema BLLE; Noted ACE wrap RUE; Noted ABD pad with ACE wrap RLE-dressing clean dry and intact  Dialysis Access: L IJ [facility]   Filed Weights   02/20/21 0752 02/20/21 1136 02/21/21 0540  Weight: 39.8 kg 40 kg 40.6 kg    Intake/Output Summary (Last 24 hours) at 02/21/2021 0933 Last data filed at 02/21/2021 0600 Gross per 24 hour  Intake 340 ml  Output -250 ml  Net 590 ml    Additional Objective Labs: Basic Metabolic Panel: Recent Labs  Lab 02/17/21 0400 02/18/21 0214 02/18/21 1201 02/20/21 0318  NA 138 136 136 136  K 3.4* 3.4* 3.3* 3.4*  CL 104 101 97* 102  CO2 24 24  --  23  GLUCOSE 79 81 81 98  BUN 21 29* 6* 41*  CREATININE 6.15* 7.66* 2.70* 6.07*  CALCIUM 8.6* 8.6*  --  8.5*  PHOS 4.1 5.6*  --  4.1   Liver Function Tests: Recent Labs  Lab 02/17/21 0400 02/18/21 0214 02/20/21 0318  ALBUMIN 2.2* 2.1* 2.2*   No results for input(s): LIPASE, AMYLASE in the last 168 hours. CBC: Recent Labs  Lab 02/17/21 0400 02/18/21 0214 02/18/21 1201 02/19/21 0407 02/20/21 0318 02/21/21 0345  WBC 6.4 4.6  --  6.3 5.2 5.1  NEUTROABS 4.5 3.1  --  4.2 3.6  --   HGB 8.0* 7.6*   < > 8.6* 7.4* 7.2*  HCT 25.6* 24.1*   < > 27.4* 24.2* 23.4*  MCV 94.8 93.8  --  94.2 95.3 96.3  PLT 184 176  --   180 179 158   < > = values in this interval not displayed.   Blood Culture    Component Value Date/Time   SDES WOUND 02/18/2021 1300   SPECREQUEST RIGHT ARM SPEC A 02/18/2021 1300   CULT  02/18/2021 1300    MODERATE ESCHERICHIA COLI NO ANAEROBES ISOLATED; CULTURE IN PROGRESS FOR 5 DAYS    REPTSTATUS PENDING 02/18/2021 1300    Cardiac Enzymes: No results for input(s): CKTOTAL, CKMB, CKMBINDEX, TROPONINI in the last 168 hours. CBG: Recent Labs  Lab 02/16/21 2134  GLUCAP 92   Iron Studies: No results for input(s): IRON, TIBC, TRANSFERRIN, FERRITIN in the last 72 hours. Lab Results  Component Value Date   INR 1.2 01/24/2021   INR 1.14 12/28/2011   INR 1.10 12/13/2010   Studies/Results: No results found.  Medications:   ceFAZolin (ANCEF) IV 2 g (02/21/21 0008)    (feeding supplement) PROSource Plus  30 mL Oral BID BM   amLODipine  10 mg Oral Daily   apixaban  2.5 mg Oral BID   vitamin C  500 mg Oral BID   bictegravir-emtricitabine-tenofovir AF  1 tablet Oral  Daily   calcitRIOL  1.25 mcg Oral Q T,Th,Sa-HD   Chlorhexidine Gluconate Cloth  6 each Topical Daily   [START ON 02/22/2021] darbepoetin (ARANESP) injection - DIALYSIS  100 mcg Intravenous Q Sat-HD   lacosamide  150 mg Oral BID   levETIRAcetam  1,000 mg Oral Daily   levETIRAcetam  250 mg Oral Q T,Th,Sat-1800   megestrol  400 mg Oral Daily   metoprolol tartrate  25 mg Oral BID   mirtazapine  15 mg Oral QHS   nystatin  5 mL Oral QID   ondansetron  4 mg Oral Q6H   pantoprazole  40 mg Oral QAC breakfast   saccharomyces boulardii  250 mg Oral BID   thiamine  100 mg Oral Daily   venlafaxine  75 mg Oral BID    Dialysis Orders: [name] TTS,EDW 41.5 kg,Bath 2K/2.5 ca,BF 350  DF 500,Time - 180 minutes Access catheter  Medications - out of hectorol and now on calcitriol - 1.25 mcg each tx. Sensipar 90 mg three times a week; Epogen 3600 units each tx, Heparin 1000 units bolus then 500 units per hour Has been on  vanc 500 mg each tx (though misses often)  Assessment/Plan: 1. Bleeding from surgical wound. Ruptured pseudoaneurysm s/p right brachial-brachial bypass with ligation of the native brachial artery on 02/03/21.  Per Vascular. Seen by [name] -[facility]VS following-S/p I&D R arm postsurgical wound 02/18/21 by Dr. Donzetta Matters. Continue to hold Eliquis. 2. ESRD - Usual HD TTS. Back on schedule. Next HD 12/24. No heparin . Added K+ bath. Post weight 12/22 was 40kg, likely new edw (euvolemic with this weight) 3. Hypokalemia -in the setting of ongoing diarrhea which is improving as per patient. K+ now 3.4. on 4K bath. 4. HTN/volume- Overall BP stable. Euvolemic on exam. 5.  Anemia-  Hgb fluctuating-now 7.2. Transfused 1 unit prbcs on 12/16.  Aranesp 40 q week starting 12/17.  Follow trends, transfuse dprn. esa inc'ed 6. MBD - Continue calcitriol/binders. Hold Sensipar for now d/t GI upset . Follow Ca trends. Ca/Phos ok.  7. Diarrhea - C. diff PCR negative. Per primary  8. Nutrition - Liberalize diet as tolerated. Add prot supp for low albumin   Gean Quint, MD [facility]

## 2021-02-21 NOTE — Progress Notes (Signed)
Removed 11 staples, patient tolerated well.  Site scant bleed in places but most of them are dry.  2 areas open not appropriated.  Put foam dressing.

## 2021-02-21 NOTE — Discharge Instructions (Signed)

## 2021-02-21 NOTE — Progress Notes (Addendum)
°  Progress Note    02/21/2021 7:59 AM 3 Days Post-Op  Subjective:  no new complaints   Vitals:   02/20/21 1945 02/21/21 0448  BP: 113/64 116/66  Pulse: 95 73  Resp: 16 16  Temp: 98.5 F (36.9 C) 99 F (37.2 C)  SpO2: 99% 98%   Physical Exam: Lungs:  non labored Incisions:  R arm wounds healing well with healthy wound bed; R thigh saphenectomy incision healing well; palpable R radial pulse Neurologic: A&O  CBC    Component Value Date/Time   WBC 5.1 02/21/2021 0345   RBC 2.43 (L) 02/21/2021 0345   HGB 7.2 (L) 02/21/2021 0345   HCT 23.4 (L) 02/21/2021 0345   PLT 158 02/21/2021 0345   MCV 96.3 02/21/2021 0345   MCH 29.6 02/21/2021 0345   MCHC 30.8 02/21/2021 0345   RDW 21.0 (H) 02/21/2021 0345   LYMPHSABS 1.3 02/20/2021 0318   MONOABS 0.3 02/20/2021 0318   EOSABS 0.0 02/20/2021 0318   BASOSABS 0.0 02/20/2021 0318    BMET    Component Value Date/Time   NA 136 02/20/2021 0318   K 3.4 (L) 02/20/2021 0318   CL 102 02/20/2021 0318   CO2 23 02/20/2021 0318   GLUCOSE 98 02/20/2021 0318   BUN 41 (H) 02/20/2021 0318   CREATININE 6.07 (H) 02/20/2021 0318   CREATININE 5.66 (H) 12/22/2019 1117   CALCIUM 8.5 (L) 02/20/2021 0318   CALCIUM 9.0 03/12/2011 0527   GFRNONAA 7 (L) 02/20/2021 0318   GFRNONAA 5 (L) 02/12/2015 1000   GFRAA 9 (L) 10/22/2019 0105   GFRAA 5 (L) 02/12/2015 1000    INR    Component Value Date/Time   INR 1.2 01/24/2021 1054     Intake/Output Summary (Last 24 hours) at 02/21/2021 0759 Last data filed at 02/21/2021 0600 Gross per 24 hour  Intake 340 ml  Output -250 ml  Net 590 ml     Assessment/Plan:  70 y.o. female is s/p debridement R arm wounds 3 Days Post-Op   R hand well perfused with palpable radial pulse Wet to dry dressing changed this morning; continue daily dressing changes until healed; this involves wet to dry on 2 open wounds followed by an ABD pad and a light ACE wrap Ok to remove staples from R thigh saphenectomy  incision Sensitivity data resulted; will discuss antibiotic regimen with pharmacy Ok to restart anticoagulation Ok for discharge home; office will call to arrange follow up    Dagoberto Ligas, PA-C [facility] [phone] 02/21/2021 7:59 AM   I have independently interviewed and examined patient and agree with PA assessment and plan above.   Katrell Milhorn C. Donzetta Matters, MD [facility] [place] Office: [phone] Pager: [phone]

## 2021-02-21 NOTE — Discharge Summary (Signed)
Discharge Summary  MASSA PE ERD:[id number] DOB: [birth year]  PCP: Hilbert Corrigan, MD  Admit date: 02/13/2021 Discharge date: 02/21/2021  Time spent: 25mns, more than 50% time spent on coordination of care.  Recommendations for Outpatient Follow-up:  F/u with SNF MD  for hospital discharge follow up, repeat cbc/bmp at follow up Follow-up with vascular surgery, dressing changes per vascular surgery instruction Follow-up with ID Continue dialysis TTS, antibiotic after dialysis for 2 weeks.     Discharge Diagnoses:  Active Hospital Problems   Diagnosis Date Noted   Bleeding from wound 02/13/2021   Acute blood loss anemia 02/13/2021   ESRD on dialysis ([facility] 04/06/2014   Essential hypertension    Nausea, vomiting, and diarrhea 12/15/2012   [facility] virus (HIV) disease ([facility] 11/15/2008   Gastroesophageal reflux disease 03/23/2006    Resolved Hospital Problems  No resolved problems to display.    Discharge Condition: stable  Diet recommendation: Renal diet  Filed Weights   02/20/21 0752 02/20/21 1136 02/21/21 0540  Weight: 39.8 kg 40 kg 40.6 kg    History of present illness: ( per admitting MD Dr. STamala Julian)  SFayne Mediateis a 70y.o. female with medical history significant of ESRD on HD(TTS), atrial flutter on Eliquis, pulmonary hypertension, HIV, and GERD who presented in due to bleeding from her fistula.   She had just recently been hospitalized from 11/24 -12/12 after presenting having syncopal episode while undergoing HD.  Subsequently patient was noted to be hypertensive and had seizure for which MRI revealed concern for PRES requiring Cardene drip and transferred to ICU for continuous EEG monitoring.  During hospitalization patient was noted to have a pseudoaneurysm rupture of the right arm fistula taken to the OR emergently and underwent redo of the right brachial exposure and control of hemorrhage, harvest right greater saphenous vein,  right brachial brachial bypass, and application of skin substitute to the right upper extremity.  Eliquis and Plavix were initially held and she required transfused 2 units of packed red blood cells on 12/6.  Hemoglobin had remained stable and Eliquis only was resumed on 12/7.  Since being back at the nursing facility patient reports that she has been having nausea, vomiting, and diarrhea.  Last night the staples blessed around her surgery site and she was bleeding everywhere.  She has had a small intermittent cough with whitish sputum reduction complains of some numbness in her hands.   ED Course: Upon admission into the emergency department patient was seen to be afebrile, pulse 93-1 7, respirations 17-32, blood pressures 113/89-145/82, and O2 saturations maintained on room air.  Labs significant for hemoglobin 8 potassium 3.7, BUN 32, creatinine 6.29, and calcium 7.8.  Vascular surgery had been consulted and evaluated patient.  They noted significant amount of hematoma underneath the VAC that was removed and VAC was recommended to be discontinued.  They recommended wet-to-dry dressings with gentle Ace wrap for compression and holding anticoagulation of Eliquis until it dries.  Nephrology had been consulted and [facility] called to admit.    Hospital Course:  Principal Problem:   Bleeding from wound Active Problems:   Human immunodeficiency virus (HIV) disease (HCC)   Gastroesophageal reflux disease   Nausea, vomiting, and diarrhea   Essential hypertension   ESRD on dialysis ([facility]   Acute blood loss anemia  Bleeding around AV fistula wound -Patient with recent history of ruptured pseudoaneurysm status post brachial brachial bypass with ligation of the need of brachial artery on 12/5.   -  I&D of right arm postsurgical wound on 12/20 --Dressing changes as per vascular surgery.  Anticoagulation Eliquis was held for surgery, okayed to resume by vascular surgery. -Surgical wound culture + E. coli ,  sensitive to cefazolin , resistant to ampicillin, Cipro, Unasyn .  Case discussed with vascular surgery, nephrology, pharmacy plan to give antibiotic during dialysis for total 2 weeks. -Stable removed prior to discharge ,she is cleared to discharge back to skilled nursing facility per vascular, she is going to see vascular surgery in office on January 4.   Acute blood loss anemia/anemia of chronic disease from renal failure -Hemoglobin 6.6 on presentation.  Status post 1 unit packed red cell transfusion pm 12/15  -H&H has been stable posttransfusion, Eliquis resumed after cleared by vascular surgery   End-stage renal disease on hemodialysis TTS -Nephrology following.  Dialysis as per nephrology schedule   hypokalemia -Monitor, plan for nephrology   Paroxysmal atrial flutter on chronic anticoagulation -Continue metoprolol.  Currently sinus rhythm -Eliquis held initially, resumed prior to discharge after cleared by vascular surgery   Nausea, vomiting and diarrhea -Prior to coming to the hospital ,  -None in the hospital , no BM for the last few days, tolerating diet. -Stool for C. difficile is positive for antigen but negative for toxin or PCR: Possibly colonizer; GI panel PCR negative   HIV  -continue current antiretroviral therapy -Follow-up with infectious disease   Essential hypertension -Blood pressure stable on current regimen  continue amlodipine and metoprolol  GERD -Continue PPI  Generalized deconditioning -Return to SNF    Code Status: DNR   Procedures: As described above  Consultations: Vascular surgery Nephrology  Discharge Exam: BP 129/71    Pulse 91    Temp 98 F (36.7 C) (Oral)    Resp 16    Ht '5\' 2"'  (1.575 m)    Wt 40.6 kg    LMP  (LMP Unknown)    SpO2 97%    BMI 16.37 kg/m   General: NAD, AAOx3 Cardiovascular: RRR Respiratory: Normal respiratory effort  Discharge Instructions You were cared for by a hospitalist during your hospital stay. If you  have any questions about your discharge medications or the care you received while you were in the hospital after you are discharged, you can call the unit and asked to speak with the hospitalist on call if the hospitalist that took care of you is not available. Once you are discharged, your primary care physician will handle any further medical issues. Please note that NO REFILLS for any discharge medications will be authorized once you are discharged, as it is imperative that you return to your primary care physician (or establish a relationship with a primary care physician if you do not have one) for your aftercare needs so that they can reassess your need for medications and monitor your lab values.  Discharge Instructions     Diet general   Complete by: As directed    Renal diet   Discharge wound care:   Complete by: As directed    Dressing changes per vascular surgery:  Wet to dry dressing changed this morning; continue daily dressing changes until healed   Increase activity slowly   Complete by: As directed       Allergies as of 02/21/2021       Reactions   Lactose Intolerance (gi) Other (See Comments)   unknown   Latex Rash, Itching   Penicillins Itching, Swelling, Rash   Has patient had a PCN reaction  causing immediate rash, facial/tongue/throat swelling, SOB or lightheadedness with hypotension: No Has patient had a PCN reaction causing severe rash involving mucus membranes or skin necrosis: No Has patient had a PCN reaction that required hospitalization No Has patient had a PCN reaction occurring within the last 10 years: No If all of the above answers are "NO", then may proceed with Cephalosporin use.        Medication List     STOP taking these medications    doxycycline 100 MG tablet Commonly known as: VIBRA-TABS   senna 8.6 MG tablet Commonly known as: SENOKOT       TAKE these medications    amLODipine 10 MG tablet Commonly known as: NORVASC Take 1  tablet (10 mg total) by mouth daily.   apixaban 2.5 MG Tabs tablet Commonly known as: ELIQUIS Take 1 tablet (2.5 mg total) by mouth 2 (two) times daily.   baclofen 10 MG tablet Commonly known as: LIORESAL Take 10 mg by mouth daily as needed for muscle spasms.   calcitRIOL 0.25 MCG capsule Commonly known as: ROCALTROL Take 5 capsules (1.25 mcg total) by mouth Every Tuesday,Thursday,and Saturday with dialysis.   calcium acetate 667 MG capsule Commonly known as: PHOSLO Take 2 capsules (1,334 mg total) by mouth 3 (three) times daily with meals.   ceFAZolin 2 g in sodium chloride 0.9 % 100 mL Inject 2 g into the vein Every Tuesday,Thursday,and Saturday with dialysis for 12 days. Give after hemodialysis for 2 weeks, 6 doses Start taking on: February 22, 2021   cinacalcet 30 MG tablet Commonly known as: SENSIPAR Take 3 tablets (90 mg total) by mouth Every Tuesday,Thursday,and Saturday with dialysis.   cyanocobalamin 1000 MCG/ML injection Commonly known as: (VITAMIN B-12) Inject 1,000 mcg into the muscle every 30 (thirty) days. On the 28th of every month   Lacosamide 150 MG Tabs Take 1 tablet (150 mg total) by mouth 2 (two) times daily.   lamivudine 100 MG tablet Commonly known as: EPIVIR Take 50 mg by mouth daily. (0800)   levETIRAcetam 1000 MG tablet Commonly known as: KEPPRA Take 1 tablet (1,000 mg total) by mouth daily.   levETIRAcetam 250 MG tablet Commonly known as: KEPPRA Take 1 tablet (250 mg total) by mouth every Tuesday, Thursday, and Saturday at 6 PM.   Lidocaine HCl 4 % Crea Apply 1 application topically See admin instructions. Every Tuesday, Thursday and Saturday   megestrol 400 MG/10ML suspension Commonly known as: MEGACE Take 400 mg by mouth daily.   metoprolol tartrate 25 MG tablet Commonly known as: LOPRESSOR Take 1 tablet (25 mg total) by mouth 2 (two) times daily.   Minerin Lotn Apply 1 application topically every 12 (twelve) hours as needed (dry  skin). Apply to feet and ankles   mirtazapine 15 MG tablet Commonly known as: REMERON Take 15 mg by mouth at bedtime.   multivitamin with minerals Tabs tablet Take 1 tablet by mouth at bedtime. (2000)   nitroGLYCERIN 0.4 MG SL tablet Commonly known as: NITROSTAT Place 0.4 mg under the tongue every 5 (five) minutes x 3 doses as needed for chest pain.   nystatin 100000 UNIT/ML suspension Commonly known as: MYCOSTATIN Take 5 mLs by mouth 4 (four) times daily.   ondansetron 8 MG disintegrating tablet Commonly known as: Zofran ODT Take 1 tablet (8 mg total) by mouth every 8 (eight) hours as needed for nausea or vomiting.   oxyCODONE-acetaminophen 5-325 MG tablet Commonly known as: PERCOCET/ROXICET Take 1-2 tablets by mouth every  6 (six) hours as needed for moderate pain. What changed: how much to take   pantoprazole 40 MG tablet Commonly known as: Protonix Take 1 tablet (40 mg total) by mouth daily before breakfast.   polyethylene glycol 17 g packet Commonly known as: MIRALAX / GLYCOLAX Take 17 g by mouth daily as needed for moderate constipation.   PRO-STAT 64 PO Take 30 mLs by mouth daily. Give 65m by mouth in the morning for wound healing   saccharomyces boulardii 250 MG capsule Commonly known as: FLORASTOR Take 1 capsule (250 mg total) by mouth 2 (two) times daily.   senna-docusate 8.6-50 MG tablet Commonly known as: Senokot-S Take 1 tablet by mouth 2 (two) times daily.   thiamine 100 MG tablet Take 1 tablet (100 mg total) by mouth daily.   Tivicay 50 MG tablet Generic drug: dolutegravir Take 50 mg by mouth daily at 4 PM.   venlafaxine 75 MG tablet Commonly known as: EFFEXOR Take 1 tablet (75 mg total) by mouth 2 (two) times daily.   vitamin C 500 MG tablet Commonly known as: ASCORBIC ACID Take 500 mg by mouth 2 (two) times daily.   zidovudine 100 MG capsule Commonly known as: RETROVIR Take 100 mg by mouth 2 (two) times daily.                Discharge Care Instructions  (From admission, onward)           Start     Ordered   02/21/21 0000  Discharge wound care:       Comments: Dressing changes per vascular surgery:  Wet to dry dressing changed this morning; continue daily dressing changes until healed   02/21/21 1002           Allergies  Allergen Reactions   Lactose Intolerance (Gi) Other (See Comments)    unknown   Latex Rash and Itching   Penicillins Itching, Swelling and Rash    Has patient had a PCN reaction causing immediate rash, facial/tongue/throat swelling, SOB or lightheadedness with hypotension: No Has patient had a PCN reaction causing severe rash involving mucus membranes or skin necrosis: No Has patient had a PCN reaction that required hospitalization No Has patient had a PCN reaction occurring within the last 10 years: No If all of the above answers are "NO", then may proceed with Cephalosporin use.        The results of significant diagnostics from this hospitalization (including imaging, microbiology, ancillary and laboratory) are listed below for reference.    Significant Diagnostic Studies: DG Chest 1 View  Result Date: 02/13/2021 CLINICAL DATA:  Difficulty breathing EXAM: CHEST  1 VIEW COMPARISON:  01/31/2021 FINDINGS: Transverse diameter of heart is increased. Central pulmonary vessels are less prominent. Prominence of hilar regions most likely is due to ectatic central pulmonary arteries. There are no signs of alveolar pulmonary edema or new focal infiltrates. There is no pleural effusion or pneumothorax. Tip of dialysis catheter is seen in the region of right atrium. Surgical clips are seen in the right axilla. There is a vascular stent in the course of right innominate. IMPRESSION: Cardiomegaly. There are no signs of pulmonary edema or new focal infiltrates. Electronically Signed   By: PElmer PickerM.D.   On: 02/13/2021 13:56   DG Abd 1 View  Result Date: 01/28/2021 CLINICAL  DATA:  Feeding tube placement EXAM: ABDOMEN - 1 VIEW COMPARISON:  Portable exam 1018 hours compared to 01/27/2021 FINDINGS: Tip of feeding tube projects over distal  gastric antrum/pylorus. Nonobstructive bowel gas pattern. Bones demineralized. Lung bases clear. IMPRESSION: Tip of feeding tube projects over distal gastric antrum/pylorus. Electronically Signed   By: Lavonia Dana M.D.   On: 01/28/2021 13:13   CT HEAD WO CONTRAST (5MM)  Result Date: 01/24/2021 CLINICAL DATA:  Possible seizure prior to arrival, near syncope during dialysis. EXAM: CT HEAD WITHOUT CONTRAST TECHNIQUE: Contiguous axial images were obtained from the base of the skull through the vertex without intravenous contrast. COMPARISON:  01/23/2021. FINDINGS: Brain: No evidence of acute infarction, hemorrhage, cerebral edema, mass, mass effect, or midline shift. No hydrocephalus or extra-axial fluid collection. Periventricular white matter changes, likely the sequela of chronic small vessel ischemic disease. Vascular: No hyperdense vessel. Atherosclerotic calcifications in the intracranial carotid and vertebral arteries. Skull: Renal osteodystrophy.  Negative for fracture or focal lesion. Sinuses/Orbits: No acute finding. Other: The mastoid air cells are well aerated. IMPRESSION: No acute intracranial process. Electronically Signed   By: Merilyn Baba M.D.   On: 01/24/2021 02:13   CT Head Wo Contrast  Result Date: 01/23/2021 CLINICAL DATA:  Mental status changes of unknown cause in a 70 year old female. EXAM: CT HEAD WITHOUT CONTRAST TECHNIQUE: Contiguous axial images were obtained from the base of the skull through the vertex without intravenous contrast. COMPARISON:  April 07, 2020. FINDINGS: Brain: No evidence of acute infarction, hemorrhage, hydrocephalus, extra-axial collection or mass lesion/mass effect. Signs of atrophy and chronic microvascular ischemic change as before. Vascular: No hyperdense vessel or unexpected calcification.  Skull: Normal. Negative for fracture or focal lesion. Signs of renal osteodystrophy. Sinuses/Orbits: Visualized paranasal sinuses and orbits without acute process. Other: None IMPRESSION: No acute intracranial pathology. Signs of atrophy and chronic microvascular ischemic change as before. Signs of renal osteodystrophy. Electronically Signed   By: Zetta Bills M.D.   On: 01/23/2021 10:45   MR BRAIN WO CONTRAST  Result Date: 01/24/2021 CLINICAL DATA:  Mental status change. Dialysis patient. Possible seizure. EXAM: MRI HEAD WITHOUT CONTRAST TECHNIQUE: Multiplanar, multiecho pulse sequences of the brain and surrounding structures were obtained without intravenous contrast. COMPARISON:  CT head 01/24/2021, MRI 11/18/2020 FINDINGS: Brain: Motion degraded study. Susceptibility artifact right occipital region. No metal is seen in this area on CT. Cortical and subcortical FLAIR hyperintensity is present in the occipital parietal lobes bilaterally, not present on the recent MRI. Moderate to extensive chronic white matter changes throughout the periventricular deep white matter bilaterally similar to the prior MRI. Brainstem intact. 12 mm hyperintensity right cerebellum not seen on the prior MRI. Negative for acute infarct.  Negative for hemorrhage or mass. Vascular: Normal arterial flow voids. Skull and upper cervical spine: Limited evaluation due to artifact. Sinuses/Orbits: Negative Other: None IMPRESSION: Negative for acute infarct Cortical and subcortical edema in the occipital parietal lobes bilaterally, right for slightly greater than left. This is not present on the prior MRI of 11/18/2020. Findings may be due to PRES. Correlate with hypertension history. Electronically Signed   By: Franchot Gallo M.D.   On: 01/24/2021 12:22   US Carotid Bilateral  Result Date: 01/24/2021 CLINICAL DATA:  Syncope, hypertension, altered mental status EXAM: BILATERAL CAROTID DUPLEX ULTRASOUND TECHNIQUE: Pearline Cables scale imaging,  color Doppler and duplex ultrasound were performed of bilateral carotid and vertebral arteries in the neck. COMPARISON:  None. FINDINGS: Criteria: Quantification of carotid stenosis is based on velocity parameters that correlate the residual internal carotid diameter with NASCET-based stenosis levels, using the diameter of the distal internal carotid lumen as the denominator for stenosis measurement. The following  velocity measurements were obtained: RIGHT ICA: 56/17 cm/sec CCA: 462/70 cm/sec SYSTOLIC ICA/CCA RATIO:  0.4 ECA: 40 cm/sec LEFT ICA: 90/19 cm/sec CCA: 350/09 cm/sec SYSTOLIC ICA/CCA RATIO:  0.8 ECA: 51 cm/sec RIGHT CAROTID ARTERY: Tortuosity and mild to moderate atherosclerotic change. Despite this, no significant right ICA stenosis, velocity elevation, turbulent flow. Degree of narrowing less than 50% by ultrasound criteria. RIGHT VERTEBRAL ARTERY:  Normal antegrade flow LEFT CAROTID ARTERY: Similar atherosclerotic change. Negative for stenosis, velocity elevation, turbulent flow. Degree of narrowing also less than 50% by ultrasound criteria. LEFT VERTEBRAL ARTERY:  Normal antegrade flow IMPRESSION: Bilateral carotid atherosclerosis. Negative for stenosis. Degree of narrowing less than 50% bilaterally by ultrasound criteria. Patent antegrade vertebral flow bilaterally Electronically Signed   By: Jerilynn Mages.  Shick M.D.   On: 01/24/2021 11:58   DG CHEST PORT 1 VIEW  Result Date: 01/31/2021 CLINICAL DATA:  Dyspnea EXAM: PORTABLE CHEST 1 VIEW COMPARISON:  01/23/2021 FINDINGS: Cardiac shadow is stable. Aortic calcifications are again seen. Left jugular dialysis catheter is noted in satisfactory position. Feeding catheter extends into the stomach. Changes of prior vascular stenting are noted on the right stable from the prior exam. Central vascular prominence is noted. Some mild interstitial changes are noted increased from the prior exam. No focal confluent infiltrate or effusion is seen. No bony abnormality is  noted. IMPRESSION: Changes of mild CHF. No other focal abnormality is seen. Electronically Signed   By: Inez Catalina M.D.   On: 01/31/2021 19:02   DG Chest Portable 1 View  Result Date: 01/23/2021 CLINICAL DATA:  Syncope. EXAM: PORTABLE CHEST 1 VIEW COMPARISON:  Chest x-ray dated December 24, 2020. FINDINGS: Unchanged tunneled left internal jugular dialysis catheter. Stable cardiomegaly. Unchanged right brachiocephalic vein stent. Unchanged central pulmonary artery enlargement. No focal consolidation, pleural effusion, or pneumothorax. No acute osseous abnormality. IMPRESSION: 1. No active disease. Electronically Signed   By: Titus Dubin M.D.   On: 01/23/2021 10:49   DG Abd Portable 1V  Result Date: 01/27/2021 CLINICAL DATA:  Post feeding tube placement. EXAM: PORTABLE ABDOMEN - 1 VIEW COMPARISON:  CT abdomen and pelvis 10/01/2019. FINDINGS: Feeding tube tip is at the level of the gastric antrum. No dilated bowel loops visualized. No acute fractures. IMPRESSION: Feeding tube tip at the level of the gastric antrum. Electronically Signed   By: Ronney Asters M.D.   On: 01/27/2021 15:40   DG Swallowing Func-Speech Pathology  Result Date: 02/10/2021 Table formatting from the original result was not included. Objective Swallowing Evaluation: Type of Study: MBS-Modified Barium Swallow Study  Patient Details Name: JERMIKA OLDEN MRN: [id number] Date of Birth: 1950-10-14 Today's Date: 01/31/2021 Time: SLP Start Time (ACUTE ONLY): 1200 -SLP Stop Time (ACUTE ONLY): 1696 SLP Time Calculation (min) (ACUTE ONLY): 30 min Past Medical History: Past Medical History: Diagnosis Date  Anemia   Anxiety   Atrial flutter ([place]) 02/22/2015  C. difficile colitis 10/10/2011  Chronic diarrhea   Chronic pain   Depression   ESRD on hemodialysis [facility])   Dialysis [name]  GERD (gastroesophageal reflux disease)   HIV (human immunodeficiency virus infection) ([place])   Hypertension   Insomnia   Intracranial hemorrhage (H[facility])    Muscle weakness (generalized)   Pulmonary HTN ([place])   Tachycardia   TIA (transient ischemic attack)   [facility] do not have this dx  Traumatic hematoma of right upper arm 02/22/2015 Past Surgical History: Past Surgical History: Procedure Laterality Date  A/V FISTULAGRAM N/A 04/17/2016  Procedure: A/V  Fistulagram - Right Upper;  Surgeon: Waynetta Sandy, MD;  Location: [place] CV [facility];  Service: Cardiovascular;  Laterality: N/A;  A/V FISTULAGRAM Right 08/28/2019  Procedure: A/V FISTULAGRAM;  Surgeon: Waynetta Sandy, MD;  Location: [place] [facility];  Service: Cardiovascular;  Laterality: Right;  ABDOMINAL HYSTERECTOMY    APPLICATION OF A-CELL OF BACK Right 62/05/7626  Procedure: APPLICATION OF MYRIAD MATRIX;  Surgeon: Cherre Robins, MD;  Location: [place];  Service: Vascular;  Laterality: Right;  APPLICATION OF WOUND VAC Right 31/07/1759  Procedure: APPLICATION OF WOUND VAC;  Surgeon: Waynetta Sandy, MD;  Location: [place];  Service: Vascular;  Laterality: Right;  APPLICATION OF WOUND VAC Right 60/08/3708  Procedure: APPLICATION OF WOUND VAC;  Surgeon: Cherre Robins, MD;  Location: [facility];  Service: Vascular;  Laterality: Right;  BIOPSY  01/02/2020  Procedure: BIOPSY;  Surgeon: Eloise Harman, DO;  [facility];  Service: Endoscopy;;  BIOPSY  07/11/2020  Procedure: BIOPSY;  Surgeon: Irving Copas., MD;  Location: [place];  Service: Gastroenterology;;  BIOPSY THYROID    CARPAL TUNNEL RELEASE Right 11/16/2019  Procedure: CARPAL TUNNEL RELEASE;  Surgeon: Leanora Cover, MD;  Location: [place];  Service: Orthopedics;  Laterality: Right;  COLONOSCOPY WITH PROPOFOL N/A 01/02/2020  Surgeon: Eloise Harman, DO;nonbleeding internal hemorrhoids, diverticulosis, status post biopsies of the entire colon (benign). Due for repeat in 2026.  Dialysis Shunts    previous one removed from left arm and now present one in right arm  DIALYSIS/PERMA CATHETER  INSERTION Right 04/17/2016  Procedure: dialysis Catheter Insertion central veinous;  Surgeon: Waynetta Sandy, MD;  Location: [place] [facility];  Service: Cardiovascular;  Laterality: Right;  ESOPHAGOGASTRODUODENOSCOPY  12/15/2010  [place]: erosive reflux esophagitis, MW tear, hiatal hernia (small), gastritis with no h.pylori, possibly nsaid related  ESOPHAGOGASTRODUODENOSCOPY (EGD) WITH PROPOFOL N/A 07/11/2020   Surgeon: Irving Copas., MD; normal esophagus, 4 cm hiatal hernia, gastritis biopsied (mild chronic gastritis, negative H. pylori), single duodenal polyp (Brunner's gland hyperplasia), s/p duodenal biopsy benign, duodenal narrowing at the duodenal sweep.  EUS N/A 07/11/2020   Surgeon: Irving Copas., MD;  dilated CBD and common hepatic duct without stones or sludge in the bile duct, stones and biliary sludge in the gallbladder, pancreatic parenchyma with hyperechoic strands, pancreatic duct dilated with prominent sidebranches in the neck and body and tail, no pancreatic mass though visulalization limited, no ampullary lesion, no malignant appearing lymph node  EXCISION OF BREAST BIOPSY Right 05/04/2012  Procedure: EXCISION OF BREAST BIOPSY;  Surgeon: Donato Heinz, MD;  Location: [facility];  Service: General;  Laterality: Right;  Right Excisional Breast Biopsy  FISTULOGRAM Right 03/26/2014  Procedure: Right Arm Fistulogram with Venoplasty Right Subclavian Vein and Inominate Vein. Debridement Fistula Ulcer;  Surgeon: Conrad Nitro, MD;  Location: [place];  Service: Vascular;  Laterality: Right;  FISTULOGRAM Right 03/29/2017  Procedure: FISTULOGRAM COMPLEX RIGHT ARM with Balloon angioplasty;  Surgeon: Waynetta Sandy, MD;  Location: [place];  Service: Vascular;  Laterality: Right;  HEMOSTASIS CLIP PLACEMENT  07/11/2020  Procedure: HEMOSTASIS CLIP PLACEMENT;  Surgeon: Irving Copas., MD;  Location: [place];  Service: Gastroenterology;;  INCISION AND DRAINAGE Right  12/27/2020  Procedure: INCISION AND DRAINAGE OF RIGHT FOREARM;  Surgeon: Waynetta Sandy, MD;  Location: [place];  Service: Vascular;  Laterality: Right;  INCISION AND DRAINAGE Right 12/31/2020  Procedure: INCISION AND DRAINAGE RIGHT ARM WITH PLACEMNENT OF WOUND VAC;  Surgeon: Waynetta Sandy,  MD;  Location: [place];  Service: Vascular;  Laterality: Right;  INCISION AND DRAINAGE Right 01/07/2021  Procedure: RIGHT ARM INCISION AND DRAINAGE;  Surgeon: Waynetta Sandy, MD;  Location: [place];  Service: Vascular;  Laterality: Right;  IR FLUORO GUIDE CV LINE LEFT  12/26/2020  IR FLUORO GUIDE CV LINE LEFT  12/30/2020  IR GENERIC HISTORICAL  05/19/2016  IR REMOVAL TUN CV CATH W/O FL 05/19/2016 Markus Daft, MD [facility]  IR US GUIDE VASC ACCESS LEFT  12/26/2020  IR US GUIDE VASC ACCESS LEFT  12/30/2020  LIGATION OF ARTERIOVENOUS  FISTULA Right 11/12/2020  Procedure: LIGATION OF ARTERIOVENOUS  FISTULA WITH EXCISION OF NECROTIC TISSUE;  Surgeon: Waynetta Sandy, MD;  Location: [place];  Service: Vascular;  Laterality: Right;  LIGATION OF ARTERIOVENOUS  FISTULA Right 12/27/2020  Procedure: LIGATION OF ARTERIOVENOUS  FISTULA;  Surgeon: Waynetta Sandy, MD;  Location: [place];  Service: Vascular;  Laterality: Right;  PERIPHERAL VASCULAR BALLOON ANGIOPLASTY Right 04/17/2016  Procedure: Peripheral Vascular Balloon Angioplasty;  Surgeon: Waynetta Sandy, MD;  Location: [place] [facility];  Service: Cardiovascular;  Laterality: Right;  Central segment AV Fistula  PERIPHERAL VASCULAR BALLOON ANGIOPLASTY Right 03/14/2018  Procedure: PERIPHERAL VASCULAR BALLOON ANGIOPLASTY;  Surgeon: Waynetta Sandy, MD;  Location: [place] [facility];  Service: Cardiovascular;  Laterality: Right;  subclavian vein  PERIPHERAL VASCULAR BALLOON ANGIOPLASTY Right 08/28/2019  Procedure: PERIPHERAL VASCULAR BALLOON ANGIOPLASTY;  Surgeon: Waynetta Sandy, MD;  Location: [place] [facility];   Service: Cardiovascular;  Laterality: Right;  upper arm  PERIPHERAL VASCULAR BALLOON ANGIOPLASTY Right 11/11/2020  Procedure: PERIPHERAL VASCULAR BALLOON ANGIOPLASTY;  Surgeon: Waynetta Sandy, MD;  Location: [place] [facility];  Service: Cardiovascular;  Laterality: Right;  PERIPHERAL VASCULAR INTERVENTION Right 03/14/2018  Procedure: PERIPHERAL VASCULAR INTERVENTION;  Surgeon: Waynetta Sandy, MD;  Location: [place] [facility];  Service: Cardiovascular;  Laterality: Right;  subclavian vein  POLYPECTOMY  07/11/2020  Procedure: POLYPECTOMY;  Surgeon: Mansouraty, Telford Nab., MD;  Location: [facility];  Service: Gastroenterology;;  REVISON OF ARTERIOVENOUS FISTULA Right 11/12/2020  Procedure: CREATION OF RIGHT ARM ARTERIOVENOUS FISTULA;  Surgeon: Waynetta Sandy, MD;  Location: [place];  Service: Vascular;  Laterality: Right;  REVISON OF ARTERIOVENOUS FISTULA Right 02/03/2021  Procedure: EXPLORATION OF RIGHT ARTERIOVENOUS FISTULA  AND RIGHT BRACHIAL TO BRACHIAL BYPASS;  Surgeon: Cherre Robins, MD;  Location: [place];  Service: Vascular;  Laterality: Right;  SHUNTOGRAM Right 10/19/2013  Procedure: FISTULOGRAM;  Surgeon: Conrad Linden, MD;  Location: [facility];  Service: Cardiovascular;  Laterality: Right;  SKIN SPLIT GRAFT Right 01/07/2021  Procedure: MYRIAD SKIN GRAFT PLACEMENT;  Surgeon: Waynetta Sandy, MD;  Location: [place];  Service: Vascular;  Laterality: Right;  TONSILLECTOMY    VEIN HARVEST Right 02/03/2021  Procedure: VEIN HARVEST OF UPPER RIGHT LEG;  Surgeon: Cherre Robins, MD;  Location: [place];  Service: Vascular;  Laterality: Right;  VISCERAL VENOGRAPHY Right 03/14/2018  Procedure: CENTRAL VENOGRAPHY;  Surgeon: Waynetta Sandy, MD;  Location: [place] [facility];  Service: Cardiovascular;  Laterality: Right;  upper ext fistula HPI: [name] is a 70 y.o. female with medical history significant for ESRD on HD TTS, GERD, TIA, HIV, atrial flutter on  Eliquis, anemia of chronic disease, chronic pain. Admitted from hemodialysis with syncopal episode, altered mentation, elevated blood pressure readings and seizure activity overnight.  MRI no acute CVA and findings concerning for PRES; Cortical and subcortical edema in the occipital  parietal lobes  bilaterally, right for slightly greater than left.  BSE 11/20/20 prolonged mastication, recommended Dys 2, thin and upgraded to regular several days later.  Subjective: Pt awake but very lethargic, requiring intermittent cues to remain awake.  Recommendations for follow up therapy are one component of a multi-disciplinary discharge planning process, led by the attending physician.  Recommendations may be updated based on patient status, additional functional criteria and insurance authorization. Assessment / Plan / Recommendation Clinical Impressions 01/31/2021 Clinical Impression Pt demonstrates mild persistent oropharyngeal dysphagia with impaired ability to masticate (though this is likely baseline given that it is fully related to pts lack of dentition) as well as mild to moderate oropharyngeal residue in valleculae and pyriform sinuses. There is decreased anterior hyoid excusion and epiglottic deflection with entrapment of liquids in pharyngeal sulci. No aspiration occured. There is additionally appeance of a cervical esophageal outpouching (Possibly a Zenkers though it was poorly visualized and appeared distal to typical location). Question if pt may be very close to her swallowing baseline. Presence of NG tube may be slightly impeding function. Pt able to initaite thin liquids and foods of choice. She does have difficulty masticating but again this is her baseline and pt can resume unrestricted textures. SLP Visit Diagnosis Dysphagia, oropharyngeal phase (R13.12) Attention and concentration deficit following -- Frontal lobe and executive function deficit following -- Impact on safety and function Mild aspiration risk    Treatment Recommendations 01/31/2021 Treatment Recommendations Therapy as outlined in treatment plan below   Prognosis 01/31/2021 Prognosis for Safe Diet Advancement Good Barriers to Reach Goals -- Barriers/Prognosis Comment -- Diet Recommendations 01/31/2021 SLP Diet Recommendations Regular solids;Thin liquid Liquid Administration via Cup;Straw Medication Administration Crushed with puree Compensations -- Postural Changes --   Other Recommendations 01/31/2021 Recommended Consults -- Oral Care Recommendations Oral care QID Other Recommendations -- Follow Up Recommendations No SLP follow up Assistance recommended at discharge Intermittent Supervision/Assistance Functional Status Assessment Patient has had a recent decline in their functional status and demonstrates the ability to make significant improvements in function in a reasonable and predictable amount of time. Frequency and Duration  01/31/2021 Speech Therapy Frequency (ACUTE ONLY) min 2x/week Treatment Duration 2 weeks   Oral Phase 01/31/2021 Oral Phase Impaired Oral - Pudding Teaspoon -- Oral - Pudding Cup -- Oral - Honey Teaspoon -- Oral - Honey Cup -- Oral - Nectar Teaspoon -- Oral - Nectar Cup -- Oral - Nectar Straw -- Oral - Thin Teaspoon -- Oral - Thin Cup Lingual/palatal residue Oral - Thin Straw Lingual/palatal residue Oral - Puree Lingual/palatal residue Oral - Mech Soft Lingual/palatal residue;Impaired mastication Oral - Regular -- Oral - Multi-Consistency -- Oral - Pill -- Oral Phase - Comment --  Pharyngeal Phase 01/31/2021 Pharyngeal Phase -- Pharyngeal- Pudding Teaspoon -- Pharyngeal -- Pharyngeal- Pudding Cup -- Pharyngeal -- Pharyngeal- Honey Teaspoon -- Pharyngeal -- Pharyngeal- Honey Cup -- Pharyngeal -- Pharyngeal- Nectar Teaspoon -- Pharyngeal -- Pharyngeal- Nectar Cup -- Pharyngeal -- Pharyngeal- Nectar Straw -- Pharyngeal -- Pharyngeal- Thin Teaspoon -- Pharyngeal -- Pharyngeal- Thin Cup -- Pharyngeal -- Pharyngeal- Thin Straw Pharyngeal  residue - pyriform;Reduced epiglottic inversion;Reduced tongue base retraction;Reduced anterior laryngeal mobility Pharyngeal Material does not enter airway Pharyngeal- Puree Pharyngeal residue - pyriform;Pharyngeal residue - valleculae;Reduced epiglottic inversion;Reduced pharyngeal peristalsis;Reduced anterior laryngeal mobility Pharyngeal -- Pharyngeal- Mechanical Soft -- Pharyngeal -- Pharyngeal- Regular NT Pharyngeal -- Pharyngeal- Multi-consistency -- Pharyngeal -- Pharyngeal- Pill -- Pharyngeal -- Pharyngeal Comment --  No flowsheet data found. DeBlois, Katherene Ponto 02/10/2021, 7:16 AM  EEG adult  Result Date: 01/24/2021 Lora Havens, MD     01/24/2021  2:51 PM Patient Name: CASADY VOSHELL MRN: [id number] Epilepsy Attending: Lora Havens Referring Physician/Provider: Dr Heath Lark Date: 01/24/2021 Duration: 23.35 mins Patient history: 70 year old female who presented with altered mental status, fever and seizure.  EEG to evaluate for seizure. Level of alertness:  lethargic AEDs during EEG study: LEV Technical aspects: This EEG study was done with scalp electrodes positioned according to the 10-20 International system of electrode placement. Electrical activity was acquired at a sampling rate of '500Hz'  and reviewed with a high frequency filter of '70Hz'  and a low frequency filter of '1Hz' . EEG data were recorded continuously and digitally stored. Description: No posterior dominant rhythm was seen.  EEG showed continuous generalized polymorphic sharply contoured 3 to 6 Hz theta-delta slowing. Generalized periodic discharges with triphasic morphology were noted with fluctuation frequency between 0.5- 1.5 Hz were also noted. Hyperventilation and photic stimulation were not performed.   ABNORMALITY - Periodic discharges with triphasic morphology, generalized ( GPDs) - Continuous slow, generalized IMPRESSION: This study showed generalized periodic discharges with triphasic  morphology which is on the ictal-interictal continuum and low potential for seizure recurrence. This eeg pattern can also be seen due to toxic-metabolic causes like uremia, hyperammonemia. Additionally eeg is suggestive of moderate diffuse encephalopathy, nonspecific etiology. No seizures or epileptiform discharges were seen throughout the recording. Priyanka Barbra Sarks   Overnight EEG with video  Result Date: 01/26/2021 Lora Havens, MD     01/27/2021  8:28 AM Patient Name: MARIO VOONG MRN: [id number] Epilepsy Attending: Lora Havens Referring Physician/Provider: Dr Donnetta Simpers Duration: 01/25/2021 1021 to 01/26/2021 0200, 01/26/2021 0821- 1700  Patient history: 70 year old female who presented with altered mental status, fever and seizure.  EEG to evaluate for seizure.  Level of alertness:  lethargic  AEDs during EEG study: LEV, LCM  Technical aspects: This EEG study was done with scalp electrodes positioned according to the 10-20 International system of electrode placement. Electrical activity was acquired at a sampling rate of '500Hz'  and reviewed with a high frequency filter of '70Hz'  and a low frequency filter of '1Hz' . EEG data were recorded continuously and digitally stored.  Description: No posterior dominant rhythm was seen.  EEG showed continuous generalized polymorphic sharply contoured 3 to 6 Hz theta-delta slowing. Generalized periodic discharges with triphasic morphology were noted with fluctuation frequency between 1.5-2 Hz were also noted. At around 1400, frequency of generalized periodic discharges worsened to 2.'5hz'  and appeared more rhythmic which lasted for about an hour and improved after IV vimpat was administered. 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  Hyperventilation and photic stimulation were not performed. Of note, study was disconnected between 0200 to 0821 on 01/26/2021.   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 Additionally EEG showed generalized periodic discharges with triphasic morphology which is on the ictal-interictal continuum and low to intermediate potential for seizure recurrence. This eeg pattern can also be seen due to toxic-metabolic causes like uremia, hyperammonemia. EEG is also suggestive of moderate diffuse encephalopathy, nonspecific etiology.  Lora Havens    Microbiology: Recent Results (from the past 240 hour(s))  Resp Panel by RT-PCR (Flu A&B, Covid) Nasopharyngeal Swab  Status: None   Collection Time: 02/13/21  8:16 AM   Specimen: Nasopharyngeal Swab; Nasopharyngeal(NP) swabs in vial transport medium  Result Value Ref Range Status   SARS Coronavirus 2 by RT PCR NEGATIVE NEGATIVE Final    Comment: (NOTE) SARS-CoV-2 target nucleic acids are NOT DETECTED.  The SARS-CoV-2 RNA is generally detectable in upper respiratory specimens during the acute phase of infection. The lowest concentration of SARS-CoV-2 viral copies this assay can detect is 138 copies/mL. A negative result does not preclude SARS-Cov-2 infection and should not be used as the sole basis for treatment or other patient management decisions. A negative result may occur with  improper specimen collection/handling, submission of specimen other than nasopharyngeal swab, presence of viral mutation(s) within the areas targeted by this assay, and inadequate number of viral copies(<138 copies/mL). A negative result must be combined with clinical observations, patient history, and epidemiological information. The expected result is Negative.  Fact Sheet for Patients:  EntrepreneurPulse.com.au  Fact Sheet for  Healthcare Providers:  IncredibleEmployment.be  This test is no t yet approved or cleared by the [place] FDA and  has been authorized for detection and/or diagnosis of SARS-CoV-2 by FDA under an Emergency Use Authorization (EUA). This EUA will remain  in effect (meaning this test can be used) for the duration of the COVID-19 declaration under Section 564(b)(1) of the Act, 21 U.S.C.section 360bbb-3(b)(1), unless the authorization is terminated  or revoked sooner.       Influenza A by PCR NEGATIVE NEGATIVE Final   Influenza B by PCR NEGATIVE NEGATIVE Final    Comment: (NOTE) The Xpert Xpress SARS-CoV-2/FLU/RSV plus assay is intended as an aid in the diagnosis of influenza from Nasopharyngeal swab specimens and should not be used as a sole basis for treatment. Nasal washings and aspirates are unacceptable for Xpert Xpress SARS-CoV-2/FLU/RSV testing.  Fact Sheet for Patients: EntrepreneurPulse.com.au  Fact Sheet for Healthcare Providers: IncredibleEmployment.be  This test is not yet approved or cleared by the [place] FDA and has been authorized for detection and/or diagnosis of SARS-CoV-2 by FDA under an Emergency Use Authorization (EUA). This EUA will remain in effect (meaning this test can be used) for the duration of the COVID-19 declaration under Section 564(b)(1) of the Act, 21 U.S.C. section 360bbb-3(b)(1), unless the authorization is terminated or revoked.  Performed at [facility], [address] [place], [place] [address]   C Difficile Quick Screen w PCR reflex     Status: Abnormal   Collection Time: 02/14/21  4:12 PM   Specimen: STOOL  Result Value Ref Range Status   C Diff antigen POSITIVE (A) NEGATIVE Final    Comment: CRITICAL RESULT CALLED TO, READ BACK BY AND VERIFIED WITH: K. MASHORE RN 02/14/2021 '@1949'  [place] JW    C Diff toxin NEGATIVE NEGATIVE Final   C Diff interpretation Results are  indeterminate. See PCR results.  Final    Comment: Performed at [facility], [place] [address], [place], [place] [address]  Gastrointestinal Panel by PCR , Stool     Status: None   Collection Time: 02/14/21  4:12 PM   Specimen: Stool  Result Value Ref Range Status   Campylobacter species NOT DETECTED NOT DETECTED Final   Plesimonas shigelloides NOT DETECTED NOT DETECTED Final   Salmonella species NOT DETECTED NOT DETECTED Final   Yersinia enterocolitica NOT DETECTED NOT DETECTED Final   Vibrio species NOT DETECTED NOT DETECTED Final   Vibrio cholerae NOT DETECTED NOT DETECTED Final   Enteroaggregative  E coli (EAEC) NOT DETECTED NOT DETECTED Final   Enteropathogenic E coli (EPEC) NOT DETECTED NOT DETECTED Final   Enterotoxigenic E coli (ETEC) NOT DETECTED NOT DETECTED Final   Shiga like toxin producing E coli (STEC) NOT DETECTED NOT DETECTED Final   Shigella/Enteroinvasive E coli (EIEC) NOT DETECTED NOT DETECTED Final   Cryptosporidium NOT DETECTED NOT DETECTED Final   Cyclospora cayetanensis NOT DETECTED NOT DETECTED Final   Entamoeba histolytica NOT DETECTED NOT DETECTED Final   Giardia lamblia NOT DETECTED NOT DETECTED Final   Adenovirus F40/41 NOT DETECTED NOT DETECTED Final   Astrovirus NOT DETECTED NOT DETECTED Final   Norovirus GI/GII NOT DETECTED NOT DETECTED Final   Rotavirus A NOT DETECTED NOT DETECTED Final   Sapovirus (I, II, IV, and V) NOT DETECTED NOT DETECTED Final    Comment: Performed at [facility], [address], [place], [place] [address]  C. Diff by PCR, Reflexed     Status: None   Collection Time: 02/14/21  4:12 PM  Result Value Ref Range Status   Toxigenic C. Difficile by PCR NEGATIVE NEGATIVE Final    Comment: Patient is colonized with non toxigenic C. difficile. May not need treatment unless significant symptoms are present. Performed at [facility], [place] [address] [place], [place] [address]   Aerobic/Anaerobic Culture w Gram Stain  (surgical/deep wound)     Status: None (Preliminary result)   Collection Time: 02/18/21  1:00 PM   Specimen: Wound  Result Value Ref Range Status   Specimen Description WOUND  Final   Special Requests RIGHT ARM SPEC A  Final   Gram Stain   Final    FEW WBC PRESENT, PREDOMINANTLY MONONUCLEAR RARE GRAM POSITIVE RODS Performed at [facility], [address], [place], [place] [address]    Culture   Final    MODERATE ESCHERICHIA COLI NO ANAEROBES ISOLATED; CULTURE IN PROGRESS FOR 5 DAYS    Report Status PENDING  Incomplete   Organism ID, Bacteria ESCHERICHIA COLI  Final      Susceptibility   Escherichia coli - MIC*    AMPICILLIN >=32 RESISTANT Resistant     CEFAZOLIN <=4 SENSITIVE Sensitive     CEFEPIME <=0.12 SENSITIVE Sensitive     CEFTAZIDIME <=1 SENSITIVE Sensitive     CEFTRIAXONE <=0.25 SENSITIVE Sensitive     CIPROFLOXACIN >=4 RESISTANT Resistant     GENTAMICIN <=1 SENSITIVE Sensitive     IMIPENEM <=0.25 SENSITIVE Sensitive     TRIMETH/SULFA <=20 SENSITIVE Sensitive     AMPICILLIN/SULBACTAM >=32 RESISTANT Resistant     PIP/TAZO <=4 SENSITIVE Sensitive     * MODERATE ESCHERICHIA COLI  MRSA Next Gen by PCR, Nasal     Status: None   Collection Time: 02/19/21  1:18 PM   Specimen: Nasal Mucosa; Nasal Swab  Result Value Ref Range Status   MRSA by PCR Next Gen NOT DETECTED NOT DETECTED Final    Comment: (NOTE) The GeneXpert MRSA Assay (FDA approved for NASAL specimens only), is one component of a comprehensive MRSA colonization surveillance program. It is not intended to diagnose MRSA infection nor to guide or monitor treatment for MRSA infections. Test performance is not FDA approved in patients less than 77 years old. Performed at [facility], [place] [address] [place], [place] [address]      Labs: Basic Metabolic Panel: Recent Labs  Lab 02/15/21 0229 02/16/21 0257 02/17/21 0400 02/18/21 0214 02/18/21 1201 02/20/21 0318  NA 136 138 138 136  136 136  K  3.2* 3.0* 3.4* 3.4* 3.3* 3.4*  CL 100 102 104 101 97* 102  CO2 '27 25 24 24  ' --  23  GLUCOSE 95 89 79 81 81 98  BUN '18 12 21 ' 29* 6* 41*  CREATININE 4.52* 4.27* 6.15* 7.66* 2.70* 6.07*  CALCIUM 8.6* 8.8* 8.6* 8.6*  --  8.5*  PHOS 3.9 3.6 4.1 5.6*  --  4.1   Liver Function Tests: Recent Labs  Lab 02/15/21 0229 02/16/21 0257 02/17/21 0400 02/18/21 0214 02/20/21 0318  ALBUMIN 2.3* 2.2* 2.2* 2.1* 2.2*   No results for input(s): LIPASE, AMYLASE in the last 168 hours. No results for input(s): AMMONIA in the last 168 hours. CBC: Recent Labs  Lab 02/16/21 0257 02/17/21 0400 02/18/21 0214 02/18/21 1201 02/19/21 0407 02/20/21 0318 02/21/21 0345  WBC 5.6 6.4 4.6  --  6.3 5.2 5.1  NEUTROABS 3.7 4.5 3.1  --  4.2 3.6  --   HGB 7.7* 8.0* 7.6* 9.9* 8.6* 7.4* 7.2*  HCT 23.8* 25.6* 24.1* 29.0* 27.4* 24.2* 23.4*  MCV 92.2 94.8 93.8  --  94.2 95.3 96.3  PLT 193 184 176  --  180 179 158   Cardiac Enzymes: No results for input(s): CKTOTAL, CKMB, CKMBINDEX, TROPONINI in the last 168 hours. BNP: BNP (last 3 results) No results for input(s): BNP in the last 8760 hours.  ProBNP (last 3 results) No results for input(s): PROBNP in the last 8760 hours.  CBG: Recent Labs  Lab 02/16/21 2134  GLUCAP 92       Signed:  Florencia Reasons MD, PhD, FACP  [facility] 02/21/2021, 11:16 AM

## 2021-02-21 NOTE — Care Management Important Message (Signed)
Important Message  Patient Details  Name: [name] MRN: [id number] Date of Birth: 1950/04/25   Medicare Important Message Given:  Yes     Orbie Pyo 02/21/2021, 3:29 PM

## 2021-02-23 LAB — AEROBIC/ANAEROBIC CULTURE W GRAM STAIN (SURGICAL/DEEP WOUND)

## 2021-02-23 NOTE — Progress Notes (Deleted)
Cardiology Office Note:    Date:  02/23/2021   ID:  [name], DOB 02-04-1951, MRN [id number]  PCP:  Hilbert Corrigan, MD   [facility] Providers Cardiologist:  Dorris Carnes, MD { Click to update primary MD,subspecialty MD or APP then REFRESH:1}  *** Referring MD: Hilbert Corrigan*   Chief Complaint:  No chief complaint on file. {Click here for Visit Info    :1}   Patient Profile:   [name] is a 70 y.o. 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 Zeiders was last seen by Dr. Harrington Challenger in 6/21.  As need f/u with Cardiology was recommended at that time.    She was admitted in 9/22 for management of an infected R arm pseudoaneurysm.  She required excision of necrotic tissue as well as drug coated balloon angioplasty of the R subclavian and R innominate arteries.  She had a recurrent episode of atrial flutter during that admission and was started on anticoagulation with Apixaban.  She was evaluated for chest pain with minimally elevated hs-Trops without significant trend (not c/w ACS).  Echocardiogram showed normal EF.  She had issues with metabolic encephalopathy and inpt workup was deferred.  OP ischemia eval could be considered if the pt has recurrent chest pain.    She was readmitted in 10/22 with R AVF infection (MRSA) and required 6 weeks of IV  antibiotics (OP) and a wound VAC.    She was readmitted in lat Nov 2022 with syncope at dialysis and developed seizures in the setting of elevated BPs.  MRI and clinical picture was c/w PRES.  Hospital stay was c/b uncontrolled bleeding from AV fistula and she underwent R brachial to brachial bypass.  Plavix was stopped as she was continued on Eliquis alone.    Her last hospital admission was 12/15-12/23 for recurrent bleeding at her AV fistula site.    She returns for f/u.  ***  ASSESSMENT & PLAN:   No problem-specific Assessment & Plan notes found for this encounter.        {Are you ordering a CV Procedure (e.g. stress test, cath, DCCV, TEE, etc)?   Press F2        :[id number]}   Dispo:  No follow-ups on file.    Prior CV studies: Carotid US 46/50/35 RICA < 46%; LICA < 56%  Echocardiogram 11/19/20 EF 60-65, no RWMA, normal RVSF, mild LAE, trivial MR, AV sclerosis w/o AS  Cardiac catheterization 09/16/2012 [facility]) No CAD  {Select studies to display:26339}    Past Medical History:  Diagnosis Date   Anemia    Anxiety    Atrial flutter ([place]) 02/22/2015   C. difficile colitis 10/10/2011   Chronic diarrhea    Chronic pain    Depression    ESRD on hemodialysis [facility])    Dialysis [name]   GERD (gastroesophageal reflux disease)    HIV (human immunodeficiency virus infection) ([place])    Hypertension  Insomnia    Intracranial hemorrhage ([facility])    Muscle weakness (generalized)    Pulmonary HTN ([facility])    Tachycardia    TIA (transient ischemic attack)    [facility] do not have this dx   Traumatic hematoma of right upper arm 02/22/2015   Current Medications: No outpatient medications have been marked as taking for the 03/05/21 encounter (Appointment) with Richardson Dopp T, PA-C.    Allergies:   Lactose intolerance (gi), Latex, and Penicillins   Social History   Tobacco Use   Smoking status: Former    Types: Cigarettes    Quit date: 03/12/1983    Years  since quitting: 37.9   Smokeless tobacco: Never  Vaping Use   Vaping Use: Never used  Substance Use Topics   Alcohol use: No    Alcohol/week: 0.0 standard drinks   Drug use: No    Family Hx: The patient's family history is negative for Anesthesia problems, Hypotension, Malignant hyperthermia, Pseudochol deficiency, and Colon cancer.  ROS   EKGs/Labs/Other Test Reviewed:    EKG:  EKG is *** ordered today.  The ekg ordered today demonstrates ***  Recent Labs: 01/25/2021: TSH 1.647 01/31/2021: Magnesium 1.9 02/01/2021: ALT 26 02/20/2021: BUN 41; Creatinine, Ser 6.07; Potassium 3.4; Sodium 136 02/21/2021: Hemoglobin 7.2; Platelets 158   Recent Lipid Panel Lab Results  Component Value Date/Time   CHOL 134 12/22/2019 11:17 AM   TRIG 117 01/26/2021 09:50 AM   HDL 50 12/22/2019 11:17 AM   LDLCALC 66 12/22/2019 11:17 AM     Risk Assessment/Calculations:   {Does this patient have ATRIAL FIBRILLATION?:[phone]}      Physical Exam:    VS:  LMP  (LMP Unknown)     Wt Readings from Last 3 Encounters:  02/21/21 89 lb 8.1 oz (40.6 kg)  02/10/21 83 lb 8.9 oz (37.9 kg)  01/22/21 98 lb 5.2 oz (44.6 kg)    Physical Exam ***    Medication Adjustments/Labs and Tests Ordered: Current medicines are reviewed at length with the patient today.  Concerns regarding medicines are outlined above.  Tests Ordered: No orders of the defined types were placed in this encounter.  Medication Changes: No orders of the defined types were placed in this encounter.  Signed, Richardson Dopp, PA-C  02/23/2021 3:40 PM    [facility] [place], [place], [place]  [address] Phone: [phone]; Fax: [phone]

## 2021-03-02 ENCOUNTER — Other Ambulatory Visit: Payer: Self-pay

## 2021-03-02 ENCOUNTER — Encounter (HOSPITAL_COMMUNITY): Payer: Self-pay | Admitting: Internal Medicine

## 2021-03-02 ENCOUNTER — Observation Stay (HOSPITAL_COMMUNITY)
Admission: EM | Admit: 2021-03-02 | Discharge: 2021-03-03 | Disposition: A | Discharge status: Home / Self Care | Payer: Medicare Other | Attending: Internal Medicine | Admitting: Internal Medicine

## 2021-03-02 ENCOUNTER — Emergency Department (HOSPITAL_COMMUNITY): Payer: Medicare Other

## 2021-03-02 DIAGNOSIS — B20 Human immunodeficiency virus [HIV] disease: Secondary | ICD-10-CM | POA: Diagnosis not present

## 2021-03-02 DIAGNOSIS — I10 Essential (primary) hypertension: Secondary | ICD-10-CM | POA: Diagnosis present

## 2021-03-02 DIAGNOSIS — D539 Nutritional anemia, unspecified: Secondary | ICD-10-CM

## 2021-03-02 DIAGNOSIS — Z91040 Latex allergy status: Secondary | ICD-10-CM | POA: Insufficient documentation

## 2021-03-02 DIAGNOSIS — Z79899 Other long term (current) drug therapy: Secondary | ICD-10-CM | POA: Diagnosis not present

## 2021-03-02 DIAGNOSIS — Z7901 Long term (current) use of anticoagulants: Secondary | ICD-10-CM | POA: Insufficient documentation

## 2021-03-02 DIAGNOSIS — Y713 Surgical instruments, materials and cardiovascular devices (including sutures) associated with adverse incidents: Secondary | ICD-10-CM | POA: Insufficient documentation

## 2021-03-02 DIAGNOSIS — Z992 Dependence on renal dialysis: Secondary | ICD-10-CM | POA: Insufficient documentation

## 2021-03-02 DIAGNOSIS — Z87891 Personal history of nicotine dependence: Secondary | ICD-10-CM | POA: Insufficient documentation

## 2021-03-02 DIAGNOSIS — N19 Unspecified kidney failure: Secondary | ICD-10-CM

## 2021-03-02 DIAGNOSIS — D631 Anemia in chronic kidney disease: Secondary | ICD-10-CM | POA: Insufficient documentation

## 2021-03-02 DIAGNOSIS — D62 Acute posthemorrhagic anemia: Secondary | ICD-10-CM | POA: Diagnosis not present

## 2021-03-02 DIAGNOSIS — I4892 Unspecified atrial flutter: Secondary | ICD-10-CM | POA: Insufficient documentation

## 2021-03-02 DIAGNOSIS — N186 End stage renal disease: Secondary | ICD-10-CM | POA: Diagnosis not present

## 2021-03-02 DIAGNOSIS — E8779 Other fluid overload: Secondary | ICD-10-CM | POA: Diagnosis not present

## 2021-03-02 DIAGNOSIS — E8770 Fluid overload, unspecified: Secondary | ICD-10-CM | POA: Diagnosis present

## 2021-03-02 DIAGNOSIS — T82838A Hemorrhage of vascular prosthetic devices, implants and grafts, initial encounter: Principal | ICD-10-CM | POA: Insufficient documentation

## 2021-03-02 DIAGNOSIS — E875 Hyperkalemia: Secondary | ICD-10-CM | POA: Diagnosis not present

## 2021-03-02 DIAGNOSIS — Z20822 Contact with and (suspected) exposure to covid-19: Secondary | ICD-10-CM | POA: Insufficient documentation

## 2021-03-02 DIAGNOSIS — I120 Hypertensive chronic kidney disease with stage 5 chronic kidney disease or end stage renal disease: Secondary | ICD-10-CM | POA: Diagnosis not present

## 2021-03-02 DIAGNOSIS — Z4889 Encounter for other specified surgical aftercare: Secondary | ICD-10-CM

## 2021-03-02 LAB — POC OCCULT BLOOD, ED: Fecal Occult Bld: NEGATIVE

## 2021-03-02 LAB — CBC WITH DIFFERENTIAL/PLATELET
Abs Immature Granulocytes: 0.01 10*3/uL (ref 0.00–0.07)
Basophils Absolute: 0 10*3/uL (ref 0.0–0.1)
Basophils Relative: 0 %
Eosinophils Absolute: 0.1 10*3/uL (ref 0.0–0.5)
Eosinophils Relative: 1 %
HCT: 20.8 % — ABNORMAL LOW (ref 36.0–46.0)
Hemoglobin: 6.4 g/dL — CL (ref 12.0–15.0)
Immature Granulocytes: 0 %
Lymphocytes Relative: 28 %
Lymphs Abs: 1.3 10*3/uL (ref 0.7–4.0)
MCH: 30.9 pg (ref 26.0–34.0)
MCHC: 30.8 g/dL (ref 30.0–36.0)
MCV: 100.5 fL — ABNORMAL HIGH (ref 80.0–100.0)
Monocytes Absolute: 0.4 10*3/uL (ref 0.1–1.0)
Monocytes Relative: 8 %
Neutro Abs: 2.9 10*3/uL (ref 1.7–7.7)
Neutrophils Relative %: 63 %
Platelets: 217 10*3/uL (ref 150–400)
RBC: 2.07 MIL/uL — ABNORMAL LOW (ref 3.87–5.11)
RDW: 20.5 % — ABNORMAL HIGH (ref 11.5–15.5)
WBC: 4.5 10*3/uL (ref 4.0–10.5)
nRBC: 0 % (ref 0.0–0.2)

## 2021-03-02 LAB — COMPREHENSIVE METABOLIC PANEL
ALT: <5 U/L (ref 0–44)
AST: 11 U/L — ABNORMAL LOW (ref 15–41)
Albumin: 2.4 g/dL — ABNORMAL LOW (ref 3.5–5.0)
Alkaline Phosphatase: 68 U/L (ref 38–126)
Anion gap: 17 — ABNORMAL HIGH (ref 5–15)
BUN: 98 mg/dL — ABNORMAL HIGH (ref 8–23)
CO2: 21 mmol/L — ABNORMAL LOW (ref 22–32)
Calcium: 8.8 mg/dL — ABNORMAL LOW (ref 8.9–10.3)
Chloride: 99 mmol/L (ref 98–111)
Creatinine, Ser: 10.94 mg/dL — ABNORMAL HIGH (ref 0.44–1.00)
GFR, Estimated: 3 mL/min — ABNORMAL LOW (ref >60)
Glucose, Bld: 78 mg/dL (ref 70–99)
Potassium: 6.4 mmol/L (ref 3.5–5.1)
Sodium: 137 mmol/L (ref 135–145)
Total Bilirubin: 0.5 mg/dL (ref 0.3–1.2)
Total Protein: 6.2 g/dL — ABNORMAL LOW (ref 6.5–8.1)

## 2021-03-02 LAB — RESP PANEL BY RT-PCR (FLU A&B, COVID) ARPGX2
Influenza A by PCR: NEGATIVE
Influenza B by PCR: NEGATIVE
SARS Coronavirus 2 by RT PCR: NEGATIVE

## 2021-03-02 LAB — PREPARE RBC (CROSSMATCH)

## 2021-03-02 LAB — MAGNESIUM: Magnesium: 2.7 mg/dL — ABNORMAL HIGH (ref 1.7–2.4)

## 2021-03-02 IMAGING — DX DG CHEST 2V
2 series · 2 of 2 positions shown · non-contrast
Comparison: [DATE]

CLINICAL DATA: Bibasilar rales.  Shortness of breath.

EXAM:
CHEST - 2 VIEW

[chest lat]
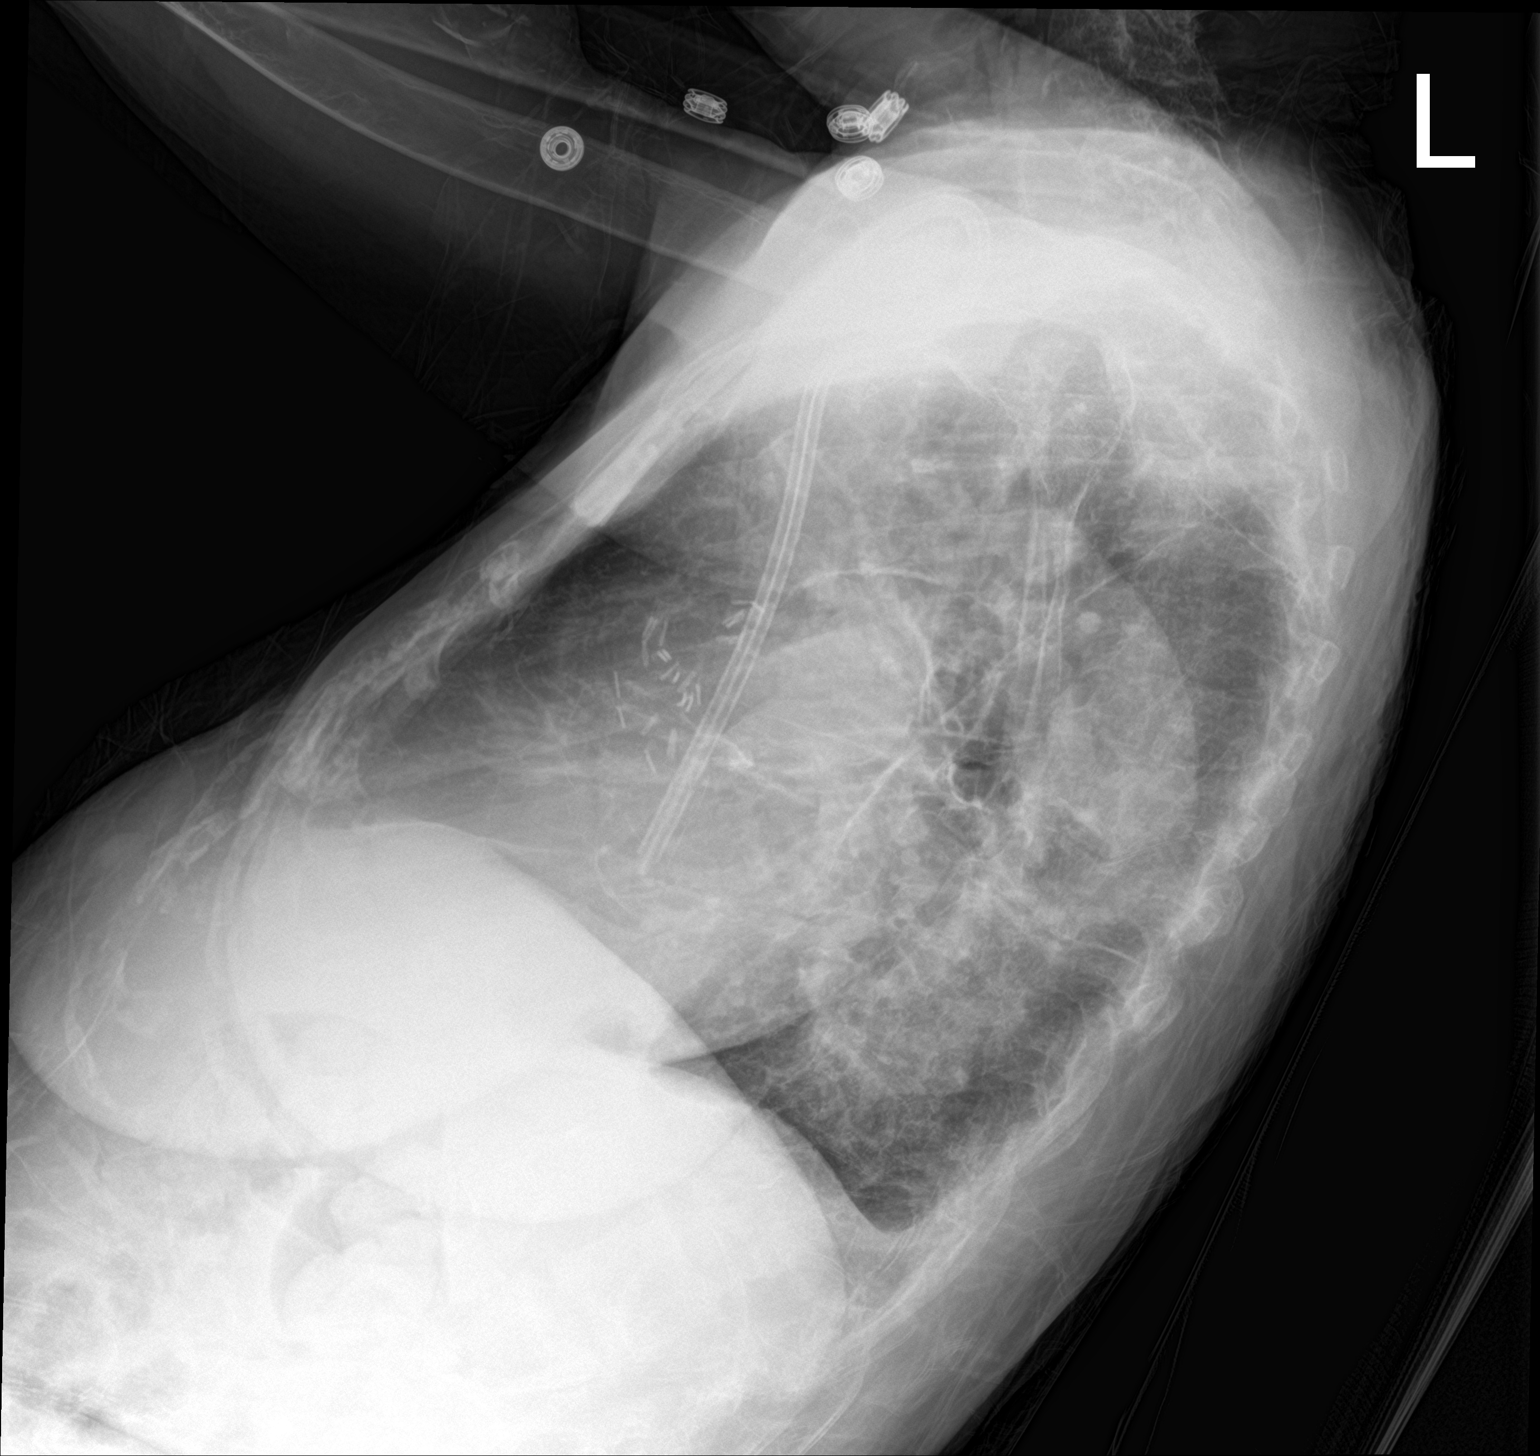

[chest ap]
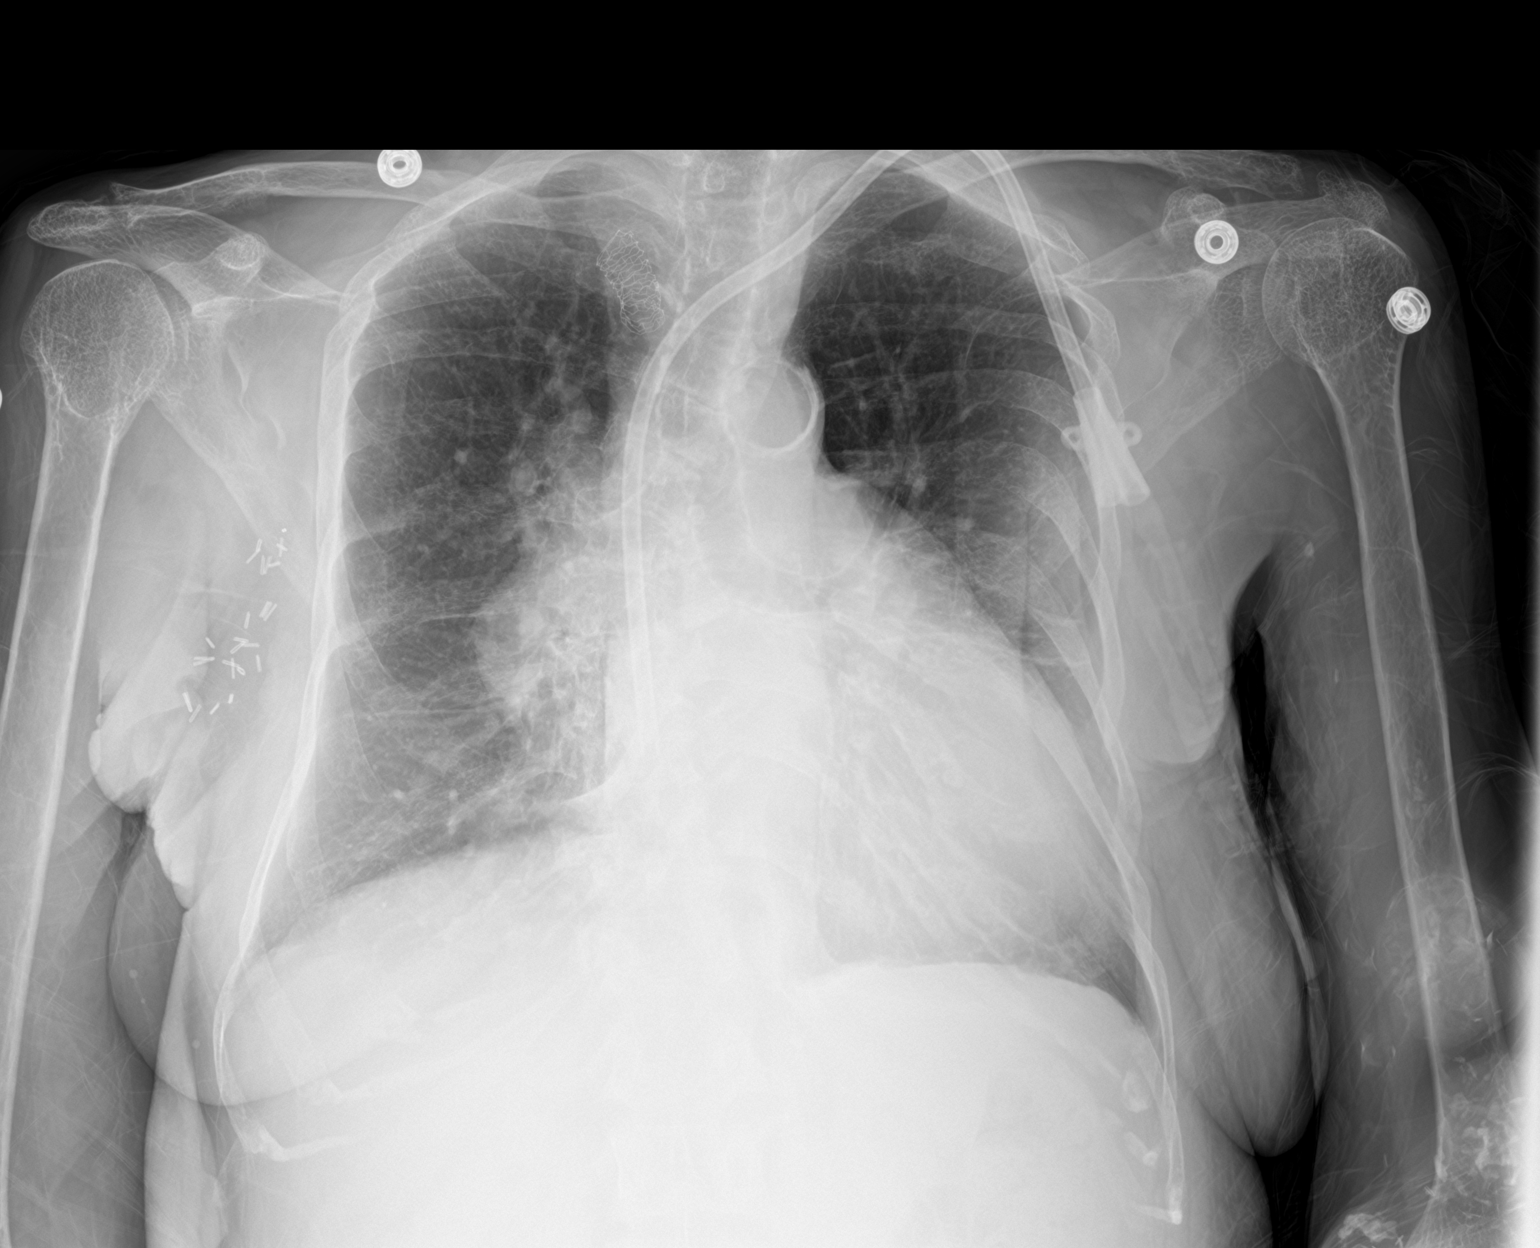

[2 of 2 positions shown; findings below may reference images not displayed]

FINDINGS: Stable position of the left jugular dialysis catheter with the tip
near the superior cavoatrial junction. Again noted is enlargement of
vascular structures in the right hilum. Heart size is upper limits
of normal but stable. Evidence for stent in the right innominate
vein region. Few densities at the medial right lung base that could
represent atelectasis. Again noted is elevation of the right
hemidiaphragm. No large pleural effusions. No overt pulmonary edema.
Surgical clips in the right axilla.
IMPRESSION: 1. Densities at the medial right lung base are nonspecific but could
represent atelectasis.
2. Chronic enlargement of the vascular structures in the right
hilum.
3. Stable position of the dialysis catheter.

## 2021-03-02 MED ORDER — ACETAMINOPHEN 325 MG PO TABS
650.0000 mg | ORAL_TABLET | Freq: Four times a day (QID) | ORAL | Status: DC | PRN
  Administered 2021-03-02 – 2021-03-03 (×2): 650 mg via ORAL
  Filled 2021-03-02 (×2): qty 2

## 2021-03-02 MED ORDER — AMLODIPINE BESYLATE 5 MG PO TABS
10.0000 mg | ORAL_TABLET | Freq: Every day | ORAL | Status: DC
  Administered 2021-03-02 – 2021-03-03 (×2): 10 mg via ORAL
  Filled 2021-03-02 (×2): qty 2

## 2021-03-02 MED ORDER — BACLOFEN 10 MG PO TABS: 10.0000 mg | ORAL_TABLET | Freq: Every day | ORAL | Status: DC | PRN

## 2021-03-02 MED ORDER — PANTOPRAZOLE SODIUM 40 MG PO TBEC
40.0000 mg | DELAYED_RELEASE_TABLET | Freq: Every day | ORAL | Status: DC
  Administered 2021-03-03: 40 mg via ORAL
  Filled 2021-03-02: qty 1

## 2021-03-02 MED ORDER — SACCHAROMYCES BOULARDII 250 MG PO CAPS
250.0000 mg | ORAL_CAPSULE | Freq: Two times a day (BID) | ORAL | Status: DC
  Administered 2021-03-02 – 2021-03-03 (×2): 250 mg via ORAL
  Filled 2021-03-02 (×2): qty 1

## 2021-03-02 MED ORDER — LEVETIRACETAM 250 MG PO TABS: 250.0000 mg | ORAL_TABLET | ORAL | Status: DC

## 2021-03-02 MED ORDER — MIRTAZAPINE 15 MG PO TABS
15.0000 mg | ORAL_TABLET | Freq: Every day | ORAL | Status: DC
  Administered 2021-03-02: 15 mg via ORAL
  Filled 2021-03-02: qty 1

## 2021-03-02 MED ORDER — ONDANSETRON HCL 4 MG/2ML IJ SOLN: 4.0000 mg | Freq: Four times a day (QID) | INTRAMUSCULAR | Status: DC | PRN

## 2021-03-02 MED ORDER — CHLORHEXIDINE GLUCONATE CLOTH 2 % EX PADS
6.0000 | MEDICATED_PAD | Freq: Every day | CUTANEOUS | Status: DC
  Administered 2021-03-03: 6 via TOPICAL

## 2021-03-02 MED ORDER — DOLUTEGRAVIR SODIUM 50 MG PO TABS
50.0000 mg | ORAL_TABLET | Freq: Every day | ORAL | Status: DC
  Filled 2021-03-02 (×2): qty 1

## 2021-03-02 MED ORDER — ZIDOVUDINE 100 MG PO CAPS
100.0000 mg | ORAL_CAPSULE | Freq: Two times a day (BID) | ORAL | Status: DC
  Administered 2021-03-03 (×2): 100 mg via ORAL
  Filled 2021-03-02 (×7): qty 1

## 2021-03-02 MED ORDER — SODIUM ZIRCONIUM CYCLOSILICATE 10 G PO PACK: 10.0000 g | PACK | Freq: Once | ORAL | Status: DC

## 2021-03-02 MED ORDER — VENLAFAXINE HCL 37.5 MG PO TABS
75.0000 mg | ORAL_TABLET | Freq: Two times a day (BID) | ORAL | Status: DC
  Administered 2021-03-02 – 2021-03-03 (×2): 75 mg via ORAL
  Filled 2021-03-02 (×2): qty 2

## 2021-03-02 MED ORDER — METOPROLOL TARTRATE 25 MG PO TABS
25.0000 mg | ORAL_TABLET | Freq: Two times a day (BID) | ORAL | Status: DC
  Administered 2021-03-02 – 2021-03-03 (×2): 25 mg via ORAL
  Filled 2021-03-02 (×2): qty 1

## 2021-03-02 MED ORDER — SODIUM CHLORIDE 0.9 % IV SOLN: 100.0000 mL | INTRAVENOUS | Status: DC | PRN

## 2021-03-02 MED ORDER — ASCORBIC ACID 500 MG PO TABS
500.0000 mg | ORAL_TABLET | Freq: Two times a day (BID) | ORAL | Status: DC
  Administered 2021-03-02 – 2021-03-03 (×2): 500 mg via ORAL
  Filled 2021-03-02 (×2): qty 1

## 2021-03-02 MED ORDER — ADULT MULTIVITAMIN W/MINERALS CH
1.0000 | ORAL_TABLET | Freq: Every day | ORAL | Status: DC
  Administered 2021-03-02: 1 via ORAL
  Filled 2021-03-02: qty 1

## 2021-03-02 MED ORDER — ONDANSETRON HCL 4 MG PO TABS: 4.0000 mg | ORAL_TABLET | Freq: Four times a day (QID) | ORAL | Status: DC | PRN

## 2021-03-02 MED ORDER — THIAMINE HCL 100 MG PO TABS
100.0000 mg | ORAL_TABLET | Freq: Every day | ORAL | Status: DC
  Administered 2021-03-03: 100 mg via ORAL
  Filled 2021-03-02: qty 1

## 2021-03-02 MED ORDER — OXYCODONE-ACETAMINOPHEN 5-325 MG PO TABS
1.0000 | ORAL_TABLET | Freq: Four times a day (QID) | ORAL | Status: DC | PRN
  Administered 2021-03-02 – 2021-03-03 (×3): 1 via ORAL
  Filled 2021-03-02 (×3): qty 1

## 2021-03-02 MED ORDER — LEVETIRACETAM 500 MG PO TABS
1000.0000 mg | ORAL_TABLET | Freq: Every day | ORAL | Status: DC
  Administered 2021-03-02 – 2021-03-03 (×2): 1000 mg via ORAL
  Filled 2021-03-02 (×2): qty 2

## 2021-03-02 MED ORDER — HEPARIN SODIUM (PORCINE) 1000 UNIT/ML DIALYSIS: 1000.0000 [IU] | INTRAMUSCULAR | Status: DC | PRN

## 2021-03-02 MED ORDER — LAMIVUDINE 100 MG PO TABS
50.0000 mg | ORAL_TABLET | Freq: Every day | ORAL | Status: DC
  Filled 2021-03-02 (×4): qty 1

## 2021-03-02 MED ORDER — ACETAMINOPHEN 650 MG RE SUPP: 650.0000 mg | Freq: Four times a day (QID) | RECTAL | Status: DC | PRN

## 2021-03-02 MED ORDER — SODIUM CHLORIDE 0.9 % IV SOLN: 10.0000 mL/h | Freq: Once | INTRAVENOUS | Status: DC

## 2021-03-02 MED ORDER — POLYETHYLENE GLYCOL 3350 17 G PO PACK: 17.0000 g | PACK | Freq: Every day | ORAL | Status: DC | PRN

## 2021-03-02 MED ORDER — CALCITRIOL 0.25 MCG PO CAPS: 1.2500 ug | ORAL_CAPSULE | ORAL | Status: DC

## 2021-03-02 MED ORDER — SENNOSIDES-DOCUSATE SODIUM 8.6-50 MG PO TABS
1.0000 | ORAL_TABLET | Freq: Two times a day (BID) | ORAL | Status: DC
  Administered 2021-03-02 – 2021-03-03 (×2): 1 via ORAL
  Filled 2021-03-02 (×2): qty 1

## 2021-03-02 MED ORDER — ALTEPLASE 2 MG IJ SOLR: 2.0000 mg | Freq: Once | INTRAMUSCULAR | Status: DC | PRN

## 2021-03-02 NOTE — Plan of Care (Signed)
  Problem: Health Behavior/Discharge Planning: Goal: Ability to manage health-related needs will improve Outcome: Progressing   Problem: Clinical Measurements: Goal: Ability to maintain clinical measurements within normal limits will improve Outcome: Progressing   

## 2021-03-02 NOTE — Procedures (Signed)
° °  HEMODIALYSIS TREATMENT NOTE: ° ° °Uneventful 3.5 hour heparin-free treatment completed using left chest wall tunneled catheter.  Entry site is unremarkable.  One unit pRBC transfused.  Goal met: 3 liters removed with minimal interruption in ultrafiltration.  All blood was returned.   ° ° ° , RN °

## 2021-03-02 NOTE — Progress Notes (Signed)
Patient returned to room from dialysis.

## 2021-03-02 NOTE — Plan of Care (Signed)
°  Problem: Activity: Goal: Risk for activity intolerance will decrease Outcome: Progressing   Problem: Nutrition: Goal: Adequate nutrition will be maintained Outcome: Progressing   Problem: Coping: Goal: Level of anxiety will decrease Outcome: Progressing   Problem: Elimination: Goal: Will not experience complications related to bowel motility Outcome: Progressing   Problem: Pain Managment: Goal: General experience of comfort will improve Outcome: Progressing   Problem: Skin Integrity: Goal: Risk for impaired skin integrity will decrease Outcome: Progressing   

## 2021-03-02 NOTE — ED Triage Notes (Signed)
Pt BIB from [facility] c/o bleeding from rt arm fistula, was here 2 weeks for the same thing. Facility applied pressure dressing, per EMS bleeding stopped on their arrival and clotting noted and fresh dressing was applied. AO x 4, denies chest pain/shortness of breath. H/O HIV.   196/131 HR= 76 SPO2 100%

## 2021-03-02 NOTE — H&P (Signed)
History and Physical  [name] HQR:[id number] DOB: 22-Jan-1951 DOA: 03/02/2021   PCP: Hilbert Corrigan, MD   Patient coming from: Home  Chief Complaint: sob  HPI:  [name] is a 71 y.o. female with medical history of 71 year old female with a history of ESRD (TTS), hypertension, TIA, AIDS, chronic pain syndrome, atrial flutter on apixaban, and depression presenting with some shortness of breath and bleeding from her right AV fistula. She had just recently been hospitalized from 11/24 -12/12 after presenting having syncopal episode while undergoing HD.  Subsequently patient was noted to be hypertensive and had seizure for which MRI revealed concern for PRES requiring Cardene drip and transferred to ICU for continuous EEG monitoring.  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  She was admitted from 02/13/2021 to 02/21/2021 secondary to bleeding from her fistula.  She was seen by vascular surgery and underwent I&D of her right arm postsurgical wound on 02/18/2021.  Wound cultures grew E. coli.  The patient was started on antibiotics.  She was subsequently sent home with oral antibiotics and restarted on apixaban.  She was discharged back to her skilled nursing facility.  She woke up in the morning on 03/02/2021 and noticed some blood around her right arm.  She thinks that the bleeding may have occurred at nighttime.  She states that the bleeding had stopped.  She denies any paresthesias or worsening pain about her right arm.  She states that she has a little more short of breath than usual. She is usually dialyzed Tuesday, Thursday, and Saturday.  She only had a partial dialysis on Thursday and missed dialysis on Saturday.  She stated she missed dialysis on Saturday because she wanted to be with her family for the holidays. In the ED, the patient was afebrile  and hemodynamically stable with oxygen saturation 99% room air.  BMP showed sodium 137, potassium 6.4, bicarbonate 21, BUN 98, creatinine 10.94.  AST 11, ALT less than 5, alk phosphatase 60, total bilirubin 0.5.  WBC 4.5, hemoglobin 6.4, platelets 2.7.  Nephrology was consulted, and the patient will be provided dialysis on 03/02/2021.  Because of her multiple medical issues and bleeding from her fistula, observation admission was requested.     Assessment/Plan: Fluid overload/ESRD -Secondary to poor compliance with dialysis -She missed her dialysis on 02/28/2022 and only had a partial dialysis on 02/18/2021 -Nephrology consulted>> she will be dialyzed 03/02/2021 -Stable on room air -She is usually TTS--[name]  Bleeding from fistula -Bleeding had ceased even prior to her coming to the emergency department -Monitor closely -She has a vascular surgery follow-up on 03/05/2021 -See pictures below  Acute blood loss anemia/anemia of CKD -Transfuse 1 unit PRBC on HD -Hemoglobin 6.4 on presentation  Paroxysmal atrial flutter -Holding anticoagulation temporarily -Continue metoprolol  Hyperkalemia -Lokelma ordered  HIV -Continue home regimen antiretroviral therapy  Essential hypertension -Continue amlodipine and metoprolol  GERD -Continue PPI  Chronic nausea vomiting -As needed Zofran -PPI twice daily        Past Medical History:  Diagnosis Date   Anemia    Anxiety    Atrial flutter ([place]) 02/22/2015   C. difficile colitis 10/10/2011   Chronic diarrhea    Chronic pain    Depression    ESRD on hemodialysis [facility])    Dialysis [name]   GERD (gastroesophageal reflux disease)  HIV (human immunodeficiency virus infection) ([place])    Hypertension    Insomnia    Intracranial hemorrhage (HCC)    Muscle weakness (generalized)    Pulmonary HTN (HCC)    Tachycardia    TIA (transient ischemic attack)    [facility] do not have this dx   Traumatic  hematoma of right upper arm 02/22/2015   Past Surgical History:  Procedure Laterality Date   A/V FISTULAGRAM N/A 04/17/2016   Procedure: A/V Fistulagram - Right Upper;  Surgeon: Waynetta Sandy, MD;  Location: [place] [facility];  Service: Cardiovascular;  Laterality: N/A;   A/V FISTULAGRAM Right 08/28/2019   Procedure: A/V FISTULAGRAM;  Surgeon: Waynetta Sandy, MD;  Location: [place] [facility];  Service: Cardiovascular;  Laterality: Right;   ABDOMINAL HYSTERECTOMY     APPLICATION OF A-CELL OF BACK Right 02/03/2021   Procedure: APPLICATION OF MYRIAD MATRIX;  Surgeon: Cherre Robins, MD;  Location: [place];  Service: Vascular;  Laterality: Right;   APPLICATION OF WOUND VAC Right 01/07/2021   Procedure: APPLICATION OF WOUND VAC;  Surgeon: Waynetta Sandy, MD;  Location: [place];  Service: Vascular;  Laterality: Right;   APPLICATION OF WOUND VAC Right 02/03/2021   Procedure: APPLICATION OF WOUND VAC;  Surgeon: Cherre Robins, MD;  Location: [place];  Service: Vascular;  Laterality: Right;   BIOPSY  01/02/2020   Procedure: BIOPSY;  Surgeon: Eloise Harman, DO;  [facility];  Service: Endoscopy;;   BIOPSY  07/11/2020   Procedure: BIOPSY;  Surgeon: Irving Copas., MD;  Location: [place];  Service: Gastroenterology;;   BIOPSY THYROID     CARPAL TUNNEL RELEASE Right 11/16/2019   Procedure: CARPAL TUNNEL RELEASE;  Surgeon: Leanora Cover, MD;  Location: [place];  Service: Orthopedics;  Laterality: Right;   COLONOSCOPY WITH PROPOFOL N/A 01/02/2020   Surgeon: Eloise Harman, DO;nonbleeding internal hemorrhoids, diverticulosis, status post biopsies of the entire colon (benign). Due for repeat in 2026.   Dialysis Shunts     previous one removed from left arm and now present one in right arm   DIALYSIS/PERMA CATHETER INSERTION Right 04/17/2016   Procedure: dialysis Catheter Insertion central veinous;  Surgeon: Waynetta Sandy, MD;  Location:  [place] [facility];  Service: Cardiovascular;  Laterality: Right;   ESOPHAGOGASTRODUODENOSCOPY  12/15/2010   [place]k: erosive reflux esophagitis, MW tear, hiatal hernia (small), gastritis with no h.pylori, possibly nsaid related   ESOPHAGOGASTRODUODENOSCOPY (EGD) WITH PROPOFOL N/A 07/11/2020    Surgeon: Irving Copas., MD; normal esophagus, 4 cm hiatal hernia, gastritis biopsied (mild chronic gastritis, negative H. pylori), single duodenal polyp (Brunner's gland hyperplasia), s/p duodenal biopsy benign, duodenal narrowing at the duodenal sweep.   EUS N/A 07/11/2020    Surgeon: Irving Copas., MD;  dilated CBD and common hepatic duct without stones or sludge in the bile duct, stones and biliary sludge in the gallbladder, pancreatic parenchyma with hyperechoic strands, pancreatic duct dilated with prominent sidebranches in the neck and body and tail, no pancreatic mass though visulalization limited, no ampullary lesion, no malignant appearing lymph node   EXCISION OF BREAST BIOPSY Right 05/04/2012   Procedure: EXCISION OF BREAST BIOPSY;  Surgeon: Donato Heinz, MD;  Location: [facility];  Service: General;  Laterality: Right;  Right Excisional Breast Biopsy   FISTULOGRAM Right 03/26/2014   Procedure: Right Arm Fistulogram with Venoplasty Right Subclavian Vein and Inominate Vein. Debridement Fistula Ulcer;  Surgeon: Conrad Fitzgerald, MD;  [facility]: [facility];  Service: Vascular;  Laterality: Right;   FISTULOGRAM Right 03/29/2017   Procedure: FISTULOGRAM COMPLEX RIGHT ARM with Balloon angioplasty;  Surgeon: Waynetta Sandy, MD;  Location: [place];  Service: Vascular;  Laterality: Right;   HEMOSTASIS CLIP PLACEMENT  07/11/2020   Procedure: HEMOSTASIS CLIP PLACEMENT;  Surgeon: Irving Copas., MD;  Location: [place];  Service: Gastroenterology;;   INCISION AND DRAINAGE Right 12/27/2020   Procedure: INCISION AND DRAINAGE OF RIGHT FOREARM;  Surgeon: Waynetta Sandy, MD;   Location: [place];  Service: Vascular;  Laterality: Right;   INCISION AND DRAINAGE Right 12/31/2020   Procedure: INCISION AND DRAINAGE RIGHT ARM WITH PLACEMNENT OF WOUND VAC;  Surgeon: Waynetta Sandy, MD;  Location: [place];  Service: Vascular;  Laterality: Right;   INCISION AND DRAINAGE Right 01/07/2021   Procedure: RIGHT ARM INCISION AND DRAINAGE;  Surgeon: Waynetta Sandy, MD;  Location: [place];  Service: Vascular;  Laterality: Right;   INCISION AND DRAINAGE OF WOUND Right 02/18/2021   Procedure: RIGHT ARM WASHOUT AND DEBRIDEMENT;  Surgeon: Waynetta Sandy, MD;  Location: [place];  Service: Vascular;  Laterality: Right;   IR FLUORO GUIDE CV LINE LEFT  12/26/2020   IR FLUORO GUIDE CV LINE LEFT  12/30/2020   IR GENERIC HISTORICAL  05/19/2016   IR REMOVAL TUN CV CATH W/O FL 05/19/2016 Markus Daft, MD [facility]   IR US GUIDE VASC ACCESS LEFT  12/26/2020   IR US GUIDE VASC ACCESS LEFT  12/30/2020   LIGATION OF ARTERIOVENOUS  FISTULA Right 11/12/2020   Procedure: LIGATION OF ARTERIOVENOUS  FISTULA WITH EXCISION OF NECROTIC TISSUE;  Surgeon: Waynetta Sandy, MD;  Location: [place];  Service: Vascular;  Laterality: Right;   LIGATION OF ARTERIOVENOUS  FISTULA Right 12/27/2020   Procedure: LIGATION OF ARTERIOVENOUS  FISTULA;  Surgeon: Waynetta Sandy, MD;  Location: [place];  Service: Vascular;  Laterality: Right;   PERIPHERAL VASCULAR BALLOON ANGIOPLASTY Right 04/17/2016   Procedure: Peripheral Vascular Balloon Angioplasty;  Surgeon: Waynetta Sandy, MD;  Location: [place] [facility];  Service: Cardiovascular;  Laterality: Right;  Central segment AV Fistula   PERIPHERAL VASCULAR BALLOON ANGIOPLASTY Right 03/14/2018   Procedure: PERIPHERAL VASCULAR BALLOON ANGIOPLASTY;  Surgeon: Waynetta Sandy, MD;  Location: [place] [facility];  Service: Cardiovascular;  Laterality: Right;  subclavian vein   PERIPHERAL VASCULAR BALLOON ANGIOPLASTY Right  08/28/2019   Procedure: PERIPHERAL VASCULAR BALLOON ANGIOPLASTY;  Surgeon: Waynetta Sandy, MD;  Location: [place] [facility];  Service: Cardiovascular;  Laterality: Right;  upper arm   PERIPHERAL VASCULAR BALLOON ANGIOPLASTY Right 11/11/2020   Procedure: PERIPHERAL VASCULAR BALLOON ANGIOPLASTY;  Surgeon: Waynetta Sandy, MD;  Location: [place] [facility];  Service: Cardiovascular;  Laterality: Right;   PERIPHERAL VASCULAR INTERVENTION Right 03/14/2018   Procedure: PERIPHERAL VASCULAR INTERVENTION;  Surgeon: Waynetta Sandy, MD;  Location: [place] [facility];  Service: Cardiovascular;  Laterality: Right;  subclavian vein   POLYPECTOMY  07/11/2020   Procedure: POLYPECTOMY;  Surgeon: Mansouraty, Telford Nab., MD;  Location: [facility] ENDOSCOPY;  Service: Gastroenterology;;   REVISON OF ARTERIOVENOUS FISTULA Right 11/12/2020   Procedure: CREATION OF RIGHT ARM ARTERIOVENOUS FISTULA;  Surgeon: Waynetta Sandy, MD;  Location: [place];  Service: Vascular;  Laterality: Right;   REVISON OF ARTERIOVENOUS FISTULA Right 02/03/2021   Procedure: EXPLORATION OF RIGHT ARTERIOVENOUS FISTULA  AND RIGHT BRACHIAL TO BRACHIAL BYPASS;  Surgeon: Cherre Robins, MD;  Location: [place];  Service:  Vascular;  Laterality: Right;   SHUNTOGRAM Right 10/19/2013   Procedure: FISTULOGRAM;  Surgeon: Conrad Gu Oidak, MD;  Location: [facility] CATH LAB;  Service: Cardiovascular;  Laterality: Right;   SKIN SPLIT GRAFT Right 01/07/2021   Procedure: MYRIAD SKIN GRAFT PLACEMENT;  Surgeon: Waynetta Sandy, MD;  Location: [place];  Service: Vascular;  Laterality: Right;   TONSILLECTOMY     VEIN HARVEST Right 02/03/2021   Procedure: VEIN HARVEST OF UPPER RIGHT LEG;  Surgeon: Cherre Robins, MD;  Location: [place];  Service: Vascular;  Laterality: Right;   VISCERAL VENOGRAPHY Right 03/14/2018   Procedure: CENTRAL VENOGRAPHY;  Surgeon: Waynetta Sandy, MD;  Location: [place] [facility];  Service:  Cardiovascular;  Laterality: Right;  upper ext fistula   Social History:  reports that she quit smoking about 38 years ago. Her smoking use included cigarettes. She has never used smokeless tobacco. She reports that she does not drink alcohol and does not use drugs.   Family History  Problem Relation Age of Onset   Anesthesia problems Neg Hx    Hypotension Neg Hx    Malignant hyperthermia Neg Hx    Pseudochol deficiency Neg Hx    Colon cancer Neg Hx        Unsure of parents who both died when she was a baby     Allergies  Allergen Reactions   Lactose Intolerance (Gi) Other (See Comments)    unknown   Latex Rash and Itching   Penicillins Itching, Swelling and Rash    Has patient had a PCN reaction causing immediate rash, facial/tongue/throat swelling, SOB or lightheadedness with hypotension: No Has patient had a PCN reaction causing severe rash involving mucus membranes or skin necrosis: No Has patient had a PCN reaction that required hospitalization No Has patient had a PCN reaction occurring within the last 10 years: No If all of the above answers are "NO", then may proceed with Cephalosporin use.       Prior to Admission medications   Medication Sig Start Date End Date Taking? Authorizing Provider  Amino Acids-Protein Hydrolys (PRO-STAT 64 PO) Take 30 mLs by mouth daily. Give 50m by mouth in the morning for wound healing    [provider]  amLODipine (NORVASC) 10 MG tablet Take 1 tablet (10 mg total) by mouth daily. 02/03/21   KBonnielee Haff MD  apixaban (ELIQUIS) 2.5 MG TABS tablet Take 1 tablet (2.5 mg total) by mouth 2 (two) times daily. 11/27/20   CUlyses Amor PA-C  baclofen (LIORESAL) 10 MG tablet Take 10 mg by mouth daily as needed for muscle spasms.    [provider]  calcitRIOL (ROCALTROL) 0.25 MCG capsule Take 5 capsules (1.25 mcg total) by mouth Every Tuesday,Thursday,and Saturday with dialysis. 02/04/21   KBonnielee Haff MD  calcium acetate  (PHOSLO) 667 MG capsule Take 2 capsules (1,334 mg total) by mouth 3 (three) times daily with meals. Patient not taking: Reported on 02/13/2021 02/10/21   KBonnielee Haff MD  ceFAZolin 2 g in sodium chloride 0.9 % 100 mL Inject 2 g into the vein Every Tuesday,Thursday,and Saturday with dialysis for 12 days. Give after hemodialysis for 2 weeks, 6 doses 02/22/21 03/06/21  XFlorencia Reasons MD  cinacalcet (SENSIPAR) 30 MG tablet Take 3 tablets (90 mg total) by mouth Every Tuesday,Thursday,and Saturday with dialysis. Patient not taking: Reported on 02/13/2021 02/04/21   KBonnielee Haff MD  cyanocobalamin (,VITAMIN B-12,) 1000 MCG/ML injection Inject 1,000 mcg into the muscle every 30 (thirty)  days. On the 28th of every month Patient not taking: Reported on 02/13/2021    [provider]  dolutegravir (TIVICAY) 50 MG tablet Take 50 mg by mouth daily at 4 PM.    [provider]  Emollient (MINERIN) LOTN Apply 1 application topically every 12 (twelve) hours as needed (dry skin). Apply to feet and ankles    [provider]  lacosamide 150 MG TABS Take 1 tablet (150 mg total) by mouth 2 (two) times daily. Patient not taking: Reported on 02/13/2021 02/03/21   Bonnielee Haff, MD  lamivudine (EPIVIR) 100 MG tablet Take 50 mg by mouth daily. (0800)    [provider]  levETIRAcetam (KEPPRA) 1000 MG tablet Take 1 tablet (1,000 mg total) by mouth daily. 02/03/21   Bonnielee Haff, MD  levETIRAcetam (KEPPRA) 250 MG tablet Take 1 tablet (250 mg total) by mouth every Tuesday, Thursday, and Saturday at 6 PM. 02/04/21   Bonnielee Haff, MD  Lidocaine HCl 4 % CREA Apply 1 application topically See admin instructions. Every Tuesday, Thursday and Saturday    [provider]  megestrol (MEGACE) 400 MG/10ML suspension Take 400 mg by mouth daily.    [provider]  metoprolol tartrate (LOPRESSOR) 25 MG tablet Take 1 tablet (25 mg total) by mouth 2 (two) times daily. 11/27/20   Ulyses Amor, PA-C  mirtazapine (REMERON) 15 MG tablet Take 15 mg by mouth at bedtime. 12/05/20   [provider]  Multiple Vitamin (MULTIVITAMIN WITH MINERALS) TABS tablet Take 1 tablet by mouth at bedtime. (2000)    [provider]  nitroGLYCERIN (NITROSTAT) 0.4 MG SL tablet Place 0.4 mg under the tongue every 5 (five) minutes x 3 doses as needed for chest pain.     [provider]  nystatin (MYCOSTATIN) 100000 UNIT/ML suspension Take 5 mLs by mouth 4 (four) times daily.    [provider]  ondansetron (ZOFRAN ODT) 8 MG disintegrating tablet Take 1 tablet (8 mg total) by mouth every 8 (eight) hours as needed for nausea or vomiting. 12/16/20   Erenest Rasher, PA-C  oxyCODONE-acetaminophen (PERCOCET/ROXICET) 5-325 MG tablet Take 1 tablet by mouth every 6 (six) hours as needed for moderate pain. 02/21/21   Florencia Reasons, MD  pantoprazole (PROTONIX) 40 MG tablet Take 1 tablet (40 mg total) by mouth daily before breakfast. 12/16/20   Erenest Rasher, PA-C  polyethylene glycol (MIRALAX / GLYCOLAX) 17 g packet Take 17 g by mouth daily as needed for moderate constipation. 02/03/21   Bonnielee Haff, MD  saccharomyces boulardii (FLORASTOR) 250 MG capsule Take 1 capsule (250 mg total) by mouth 2 (two) times daily. 02/21/21   Florencia Reasons, MD  senna-docusate (SENOKOT-S) 8.6-50 MG tablet Take 1 tablet by mouth 2 (two) times daily. 02/21/21   Florencia Reasons, MD  thiamine 100 MG tablet Take 1 tablet (100 mg total) by mouth daily. 02/03/21   Bonnielee Haff, MD  venlafaxine (EFFEXOR) 75 MG tablet Take 1 tablet (75 mg total) by mouth 2 (two) times daily. 02/03/21   Bonnielee Haff, MD  vitamin C (ASCORBIC ACID) 500 MG tablet Take 500 mg by mouth 2 (two) times daily.    [provider]  zidovudine (RETROVIR) 100 MG capsule Take 100 mg by mouth 2 (two) times daily.    [provider]    Review of Systems:  Constitutional:  No weight loss, night sweats, Fevers, chills, fatigue.   Head&Eyes: No headache.  No vision loss.  No eye pain  or scotoma ENT:  No Difficulty swallowing,Tooth/dental problems,Sore throat,  No ear ache, post nasal drip,  Cardio-vascular:  No chest pain, Orthopnea, PND, swelling in lower extremities,  dizziness, palpitations  GI:  No  abdominal pain, nausea, vomiting, diarrhea, loss of appetite, hematochezia, melena, heartburn, indigestion, Resp:  No shortness of breath with exertion or at rest. No cough. No coughing up of blood .No wheezing.No chest wall deformity  Skin:  no rash or lesions.  GU:  no dysuria, change in color of urine, no urgency or frequency. No flank pain.  Musculoskeletal:  No joint pain or swelling. No decreased range of motion. No back pain.  Psych:  No change in mood or affect. No depression or anxiety. Neurologic: No headache, no dysesthesia, no focal weakness, no vision loss. No syncope  Physical Exam: Vitals:   03/02/21 0849 03/02/21 0851 03/02/21 1000 03/02/21 1130  BP:  (!) 153/108 (!) 164/102 (!) 165/96  Pulse:  64 64 66  Resp:  '16 19 19  ' Temp:  98.9 F (37.2 C)    TempSrc:  Oral    SpO2:  99% 98% 98%  Weight: 40.6 kg     Height: '5\' 2"'  (1.575 m)      General:  A&O x 3, NAD, nontoxic, pleasant/cooperative Head/Eye: No conjunctival hemorrhage, no icterus, [place]/AT, No nystagmus ENT:  No icterus,  No thrush, good dentition, no pharyngeal exudate Neck:  No masses, no lymphadenpathy, no bruits CV:  RRR, no rub, no gallop, no S3 Lung: Bibasilar crackles.  No wheezing Abdomen: soft/NT, +BS, nondistended, no peritoneal signs Ext: No cyanosis, No rashes, No petechiae, No lymphangitis, No edema Neuro: CNII-XII intact, strength 4/5 in bilateral upper and lower extremities, no dysmetria  Labs on Admission:  Basic Metabolic Panel: Recent Labs  Lab 03/02/21 1007  NA 137  K 6.4*  CL 99  CO2 21*  GLUCOSE 78  BUN 98*  CREATININE 10.94*  CALCIUM 8.8*  MG 2.7*   Liver Function Tests: Recent Labs  Lab  03/02/21 1007  AST 11*  ALT <5  ALKPHOS 68  BILITOT 0.5  PROT 6.2*  ALBUMIN 2.4*   No results for input(s): LIPASE, AMYLASE in the last 168 hours. No results for input(s): AMMONIA in the last 168 hours. CBC: Recent Labs  Lab 03/02/21 1007  WBC 4.5  NEUTROABS 2.9  HGB 6.4*  HCT 20.8*  MCV 100.5*  PLT 217   Coagulation Profile: No results for input(s): INR, PROTIME in the last 168 hours. Cardiac Enzymes: No results for input(s): CKTOTAL, CKMB, CKMBINDEX, TROPONINI in the last 168 hours. BNP: Invalid input(s): POCBNP CBG: No results for input(s): GLUCAP in the last 168 hours. Urine analysis:    Component Value Date/Time   COLORURINE YELLOW 01/19/2009 1855   APPEARANCEUR CLEAR 01/19/2009 1855   LABSPEC 1.015 01/19/2009 1855   PHURINE 7.5 01/19/2009 1855   GLUCOSEU NEGATIVE 01/19/2009 1855   HGBUR SMALL (A) 01/19/2009 1855   BILIRUBINUR NEGATIVE 01/19/2009 1855   KETONESUR NEGATIVE 01/19/2009 1855   PROTEINUR 100 (A) 01/19/2009 1855   UROBILINOGEN 0.2 01/19/2009 1855   NITRITE NEGATIVE 01/19/2009 1855   LEUKOCYTESUR NEGATIVE 01/19/2009 1855   Sepsis Labs: '@LABRCNTIP' (procalcitonin:4,lacticidven:4) ) Recent Results (from the past 240 hour(s))  Resp Panel by RT-PCR (Flu A&B, Covid) Nasopharyngeal Swab     Status: None   Collection Time: 02/21/21 10:18 AM   Specimen: Nasopharyngeal Swab; Nasopharyngeal(NP) swabs in vial transport medium  Result Value Ref Range Status   SARS Coronavirus 2 by RT  PCR NEGATIVE NEGATIVE Final    Comment: (NOTE) SARS-CoV-2 target nucleic acids are NOT DETECTED.  The SARS-CoV-2 RNA is generally detectable in upper respiratory specimens during the acute phase of infection. The lowest concentration of SARS-CoV-2 viral copies this assay can detect is 138 copies/mL. A negative result does not preclude SARS-Cov-2 infection and should not be used as the sole basis for treatment or other patient management decisions. A negative result may  occur with  improper specimen collection/handling, submission of specimen other than nasopharyngeal swab, presence of viral mutation(s) within the areas targeted by this assay, and inadequate number of viral copies(<138 copies/mL). A negative result must be combined with clinical observations, patient history, and epidemiological information. The expected result is Negative.  Fact Sheet for Patients:  EntrepreneurPulse.com.au  Fact Sheet for Healthcare Providers:  IncredibleEmployment.be  This test is no t yet approved or cleared by the [place] FDA and  has been authorized for detection and/or diagnosis of SARS-CoV-2 by FDA under an Emergency Use Authorization (EUA). This EUA will remain  in effect (meaning this test can be used) for the duration of the COVID-19 declaration under Section 564(b)(1) of the Act, 21 U.S.C.section 360bbb-3(b)(1), unless the authorization is terminated  or revoked sooner.       Influenza A by PCR NEGATIVE NEGATIVE Final   Influenza B by PCR NEGATIVE NEGATIVE Final    Comment: (NOTE) The Xpert Xpress SARS-CoV-2/FLU/RSV plus assay is intended as an aid in the diagnosis of influenza from Nasopharyngeal swab specimens and should not be used as a sole basis for treatment. Nasal washings and aspirates are unacceptable for Xpert Xpress SARS-CoV-2/FLU/RSV testing.  Fact Sheet for Patients: EntrepreneurPulse.com.au  Fact Sheet for Healthcare Providers: IncredibleEmployment.be  This test is not yet approved or cleared by the [place] FDA and has been authorized for detection and/or diagnosis of SARS-CoV-2 by FDA under an Emergency Use Authorization (EUA). This EUA will remain in effect (meaning this test can be used) for the duration of the COVID-19 declaration under Section 564(b)(1) of the Act, 21 U.S.C. section 360bbb-3(b)(1), unless the authorization is terminated  or revoked.  Performed at [facility], [place] [address] [place], [place] [address]      Radiological Exams on Admission: DG Chest 2 View  Result Date: 03/02/2021 CLINICAL DATA:  Bibasilar rales.  Shortness of breath. EXAM: CHEST - 2 VIEW COMPARISON:  02/13/2021 FINDINGS: Stable position of the left jugular dialysis catheter with the tip near the superior cavoatrial junction. Again noted is enlargement of vascular structures in the right hilum. Heart size is upper limits of normal but stable. Evidence for stent in the right innominate vein region. Few densities at the medial right lung base that could represent atelectasis. Again noted is elevation of the right hemidiaphragm. No large pleural effusions. No overt pulmonary edema. Surgical clips in the right axilla. IMPRESSION: 1. Densities at the medial right lung base are nonspecific but could represent atelectasis. 2. Chronic enlargement of the vascular structures in the right hilum. 3. Stable position of the dialysis catheter. Electronically Signed   By: Markus Daft M.D.   On: 03/02/2021 10:11    EKG: Independently reviewed. Sinus, first degree AVB    Time spent:60 minutes Code Status:   FULL Family Communication:  No Family at bedside Disposition Plan: expect 1 day hospitalization Consults called: renal DVT Prophylaxis:  SCDs  Orson Eva, DO  [facility] Pager [phone]  If 7PM-7AM, please contact night-coverage [email or web address] Password [email or web address] 03/02/2021, 12:20  PM

## 2021-03-02 NOTE — Progress Notes (Signed)
Brief Nephrology Note  TTS patient at [facility].  Patient presented to the emergency department with bleeding from her fistula.  The patient's bleeding had stopped by the time she came to the emergency department.  Has required recent interventions on this fistula.  Currently dialyzing with a tunneled dialysis catheter.  She last had dialysis on Thursday but it was cut short because she was not feeling well and did not go to dialysis on Saturday.  In the emergency department potassium was found to be 6.4 with elevated BUN and hemoglobin of 6.4. Also with signs of volume overload  Will perform regular dialysis and plan for 1 unit of PRBCs with dialysis. If she is feeling well post dialysis she would be stable for discharge but strongly encourage compliance with HD and follow up with [facility]. If she remains inpatient we can consult formally tomorrow.

## 2021-03-02 NOTE — ED Notes (Signed)
Patient refused Covid test, PA at bedside is aware.

## 2021-03-02 NOTE — ED Provider Notes (Signed)
[facility] EMERGENCY DEPARTMENT Provider Note   CSN: [id number] Arrival date & time: 03/02/21  7654     History  Chief Complaint  Patient presents with   Vascular Access Problem    [name] is a 71 y.o. female.  HPI  Patient with medical history including end-stage renal disease currently on dialysis Tuesday Thursday Saturday hypertension, HIV, A. fib currently on Eliquis presents to the emergency department chief complaint of a bleeding fistula.  Patient states that she woke up this morning with blood around her bed, and she noted clots on her right fistula.  She think she was bleeding during the night.  She states that it has ceased bleeding, she states that she denies any worsening pain drainage or discharge of the area, she denies any paresthesias going down her right arm, she has no other complaints.  She does not endorse fevers, chills, chest pain, shortness of breath, orthopnea, worsening peripheral edema.  She states that the last time she had dialysis was on Thursday, she states she did not complete it because she started to feel sick, she not get her dialysis treatment on Saturday because she was not feeling up to it.  She has no other complaints at this time.  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  She unfortunately came back on 12/20 due to worsening bleeding of the right fistula underwent I&D placed on broad-spectrum antibiotics and was later discharged home.  She is currently antibiotics at this time is taking Ceftin, patient is also restarted back on her anticoagulant.  Home Medications Prior to Admission medications   Medication Sig Start Date End Date Taking? Authorizing Provider  Amino Acids-Protein Hydrolys (PRO-STAT 64 PO) Take 30 mLs by mouth daily. Give 73ml by mouth in the morning for wound healing    [provider]  amLODipine (NORVASC) 10 MG tablet Take 1 tablet (10 mg total) by mouth daily. 02/03/21   Bonnielee Haff, MD  apixaban (ELIQUIS) 2.5 MG TABS tablet Take 1 tablet (2.5 mg total) by mouth 2 (two) times daily. 11/27/20   Ulyses Amor, PA-C  baclofen (LIORESAL) 10 MG tablet Take 10 mg by mouth daily as needed for muscle spasms.    [provider]  calcitRIOL (ROCALTROL) 0.25 MCG capsule Take 5 capsules (1.25 mcg total) by mouth Every Tuesday,Thursday,and Saturday with dialysis. 02/04/21   Bonnielee Haff, MD  calcium acetate (PHOSLO) 667 MG capsule Take 2 capsules (1,334 mg total) by mouth 3 (three) times daily with meals. Patient not taking: Reported on 02/13/2021 02/10/21   Bonnielee Haff, MD  ceFAZolin 2 g in sodium chloride 0.9 % 100 mL Inject 2 g into the vein Every Tuesday,Thursday,and Saturday with dialysis for 12 days. Give after hemodialysis for 2 weeks, 6 doses 02/22/21 03/06/21  Florencia Reasons, MD  cinacalcet (SENSIPAR) 30 MG tablet Take 3 tablets (90 mg total) by mouth Every Tuesday,Thursday,and Saturday with dialysis. Patient not taking: Reported on 02/13/2021 02/04/21   Bonnielee Haff, MD  cyanocobalamin (,VITAMIN B-12,) 1000 MCG/ML injection Inject 1,000 mcg into the muscle every 30 (thirty) days. On the 28th of every month Patient not taking: Reported on 02/13/2021    [provider]  dolutegravir (TIVICAY) 50 MG tablet Take 50 mg by mouth daily at 4 PM.    [provider]  Emollient (MINERIN) LOTN Apply 1 application topically every 12 (twelve) hours  as needed (dry skin). Apply to feet and ankles    [provider]  lacosamide 150 MG TABS Take 1 tablet (150 mg total) by mouth 2 (two) times daily. Patient not taking: Reported on 02/13/2021 02/03/21   Bonnielee Haff, MD  lamivudine (EPIVIR) 100 MG tablet Take 50 mg by mouth daily. (0800)    [provider]  levETIRAcetam (KEPPRA) 1000 MG tablet Take 1 tablet (1,000 mg total) by mouth  daily. 02/03/21   Bonnielee Haff, MD  levETIRAcetam (KEPPRA) 250 MG tablet Take 1 tablet (250 mg total) by mouth every Tuesday, Thursday, and Saturday at 6 PM. 02/04/21   Bonnielee Haff, MD  Lidocaine HCl 4 % CREA Apply 1 application topically See admin instructions. Every Tuesday, Thursday and Saturday    [provider]  megestrol (MEGACE) 400 MG/10ML suspension Take 400 mg by mouth daily.    [provider]  metoprolol tartrate (LOPRESSOR) 25 MG tablet Take 1 tablet (25 mg total) by mouth 2 (two) times daily. 11/27/20   Ulyses Amor, PA-C  mirtazapine (REMERON) 15 MG tablet Take 15 mg by mouth at bedtime. 12/05/20   [provider]  Multiple Vitamin (MULTIVITAMIN WITH MINERALS) TABS tablet Take 1 tablet by mouth at bedtime. (2000)    [provider]  nitroGLYCERIN (NITROSTAT) 0.4 MG SL tablet Place 0.4 mg under the tongue every 5 (five) minutes x 3 doses as needed for chest pain.     [provider]  nystatin (MYCOSTATIN) 100000 UNIT/ML suspension Take 5 mLs by mouth 4 (four) times daily.    [provider]  ondansetron (ZOFRAN ODT) 8 MG disintegrating tablet Take 1 tablet (8 mg total) by mouth every 8 (eight) hours as needed for nausea or vomiting. 12/16/20   Erenest Rasher, PA-C  oxyCODONE-acetaminophen (PERCOCET/ROXICET) 5-325 MG tablet Take 1 tablet by mouth every 6 (six) hours as needed for moderate pain. 02/21/21   Florencia Reasons, MD  pantoprazole (PROTONIX) 40 MG tablet Take 1 tablet (40 mg total) by mouth daily before breakfast. 12/16/20   Erenest Rasher, PA-C  polyethylene glycol (MIRALAX / GLYCOLAX) 17 g packet Take 17 g by mouth daily as needed for moderate constipation. 02/03/21   Bonnielee Haff, MD  saccharomyces boulardii (FLORASTOR) 250 MG capsule Take 1 capsule (250 mg total) by mouth 2 (two) times daily. 02/21/21   Florencia Reasons, MD  senna-docusate (SENOKOT-S) 8.6-50 MG tablet Take 1 tablet by mouth 2 (two) times daily. 02/21/21    Florencia Reasons, MD  thiamine 100 MG tablet Take 1 tablet (100 mg total) by mouth daily. 02/03/21   Bonnielee Haff, MD  venlafaxine (EFFEXOR) 75 MG tablet Take 1 tablet (75 mg total) by mouth 2 (two) times daily. 02/03/21   Bonnielee Haff, MD  vitamin C (ASCORBIC ACID) 500 MG tablet Take 500 mg by mouth 2 (two) times daily.    [provider]  zidovudine (RETROVIR) 100 MG capsule Take 100 mg by mouth 2 (two) times daily.    [provider]      Allergies    Lactose intolerance (gi), Latex, and Penicillins    Review of Systems   Review of Systems  Constitutional:  Positive for chills. Negative for fever.  HENT:  Negative for congestion.   Respiratory:  Negative for shortness of breath.   Cardiovascular:  Negative for chest pain.  Gastrointestinal:  Negative for abdominal pain.  Genitourinary:  Negative for enuresis.  Musculoskeletal:  Negative for back pain.  Skin:  Negative for rash.       Bleeding from right fistula.  Neurological:  Positive for weakness. Negative for dizziness.  Hematological:  Does not bruise/bleed easily.   Physical Exam Updated Vital Signs BP (!) 165/96    Pulse 66    Temp 98.9 F (37.2 C) (Oral)    Resp 19    Ht 5\' 2"  (1.575 m)    Wt 40.6 kg    LMP  (LMP Unknown)    SpO2 98%    BMI 16.37 kg/m  Physical Exam Vitals and nursing note reviewed.  Constitutional:      General: She is not in acute distress.    Appearance: She is not ill-appearing.  HENT:     Head: Normocephalic and atraumatic.     Nose: No congestion.  Eyes:     Conjunctiva/sclera: Conjunctivae normal.  Cardiovascular:     Rate and Rhythm: Normal rate and regular rhythm.     Pulses: Normal pulses.     Heart sounds: No murmur heard.   No friction rub. No gallop.  Pulmonary:     Effort: No respiratory distress.     Breath sounds: No wheezing, rhonchi or rales.     Comments: Noted bibasilar rales heard in the lower lobes bilaterally. Abdominal:     Palpations: Abdomen is soft.      Tenderness: There is no abdominal tenderness. There is no right CVA tenderness or left CVA tenderness.  Skin:    General: Skin is warm and dry.     Comments: Right arm was visualized patient is a brachial branch bypass present, it is not actively bleeding on my exam, there is no surrounding erythema or edema present, no drainage or discharge noted, no palpable fluctuation or induration noted,  she has a strong palpable radial pulse, no signs of ischemia present on the right arm.  Please see picture for full detail.  Patient has a dialysis port noted in the left upper chest, there is no signs infection present, no drainage discharge noted, no fluctuant induration present.  Neurological:     Mental Status: She is alert.  Psychiatric:        Mood and Affect: Mood normal.     ED Results / Procedures / Treatments   Labs (all labs ordered are listed, but only abnormal results are displayed) Labs Reviewed  COMPREHENSIVE METABOLIC PANEL - Abnormal; Notable for the following components:      Result Value   Potassium 6.4 (*)    CO2 21 (*)    BUN 98 (*)    Creatinine, Ser 10.94 (*)    Calcium 8.8 (*)    Total Protein 6.2 (*)    Albumin 2.4 (*)    AST 11 (*)    GFR, Estimated 3 (*)    Anion gap 17 (*)    All other components within normal limits  CBC WITH DIFFERENTIAL/PLATELET - Abnormal; Notable for the following components:   RBC 2.07 (*)    Hemoglobin 6.4 (*)    HCT 20.8 (*)    MCV 100.5 (*)    RDW 20.5 (*)    All other components within normal limits  MAGNESIUM - Abnormal; Notable for the following components:   Magnesium 2.7 (*)    All other components within normal limits  RESP PANEL BY RT-PCR (FLU A&B, COVID) ARPGX2  POC OCCULT BLOOD, ED  TYPE AND SCREEN  PREPARE RBC (CROSSMATCH)    EKG None  Radiology DG Chest 2 View  Result  Date: 03/02/2021 CLINICAL DATA:  Bibasilar rales.  Shortness of breath. EXAM: CHEST - 2 VIEW COMPARISON:  02/13/2021 FINDINGS: Stable position  of the left jugular dialysis catheter with the tip near the superior cavoatrial junction. Again noted is enlargement of vascular structures in the right hilum. Heart size is upper limits of normal but stable. Evidence for stent in the right innominate vein region. Few densities at the medial right lung base that could represent atelectasis. Again noted is elevation of the right hemidiaphragm. No large pleural effusions. No overt pulmonary edema. Surgical clips in the right axilla. IMPRESSION: 1. Densities at the medial right lung base are nonspecific but could represent atelectasis. 2. Chronic enlargement of the vascular structures in the right hilum. 3. Stable position of the dialysis catheter. Electronically Signed   By: Markus Daft M.D.   On: 03/02/2021 10:11    Procedures Procedures    Medications Ordered in ED Medications  Chlorhexidine Gluconate Cloth 2 % PADS 6 each (has no administration in time range)  sodium zirconium cyclosilicate (LOKELMA) packet 10 g (has no administration in time range)  0.9 %  sodium chloride infusion (has no administration in time range)    ED Course/ Medical Decision Making/ A&P                           This patient presents to the ED for concern of bleeding from right fistula, this involves an extensive number of treatment options, and is a complaint that carries with it a high risk of complications and morbidity.  The differential diagnosis includes vascular bleeding, deep tissue infection, emergent hemodialysis    Additional history obtained:  External records from outside source obtained and reviewed including obtained from recent discharge paperwork from Dr. Annamaria Boots. Information was obtained from [place] records.   Co morbidities that complicate the patient evaluation  End-stage renal disease, hypertension, HIV  Social Determinants of Health:  N/A    Lab Tests:  I Ordered, and personally interpreted labs.  The pertinent results  include: CBC shows macrocytic anemia hemoglobin 6.4 decreased from baseline of 7.2, CMP shows potassium of 6.4, CO2 of 21, BUN of 96, creatinine 10.98, calcium 8.8, total protein 6.2 albumin 2.4 AST 11 GFR 3, anion gap 17 magnesium 2.7 Hemoccult is negative   Imaging Studies ordered:  I ordered imaging studies including chest x-ray I independently visualized and interpreted imaging which showed negative for acute findings I agree with the radiologist interpretation   Cardiac Monitoring:  The patient was maintained on a cardiac monitor.  I personally viewed and interpreted the cardiac monitored which showed an underlying rhythm of: EKG sinus without signs of ischemia.   Medicines ordered and prescription drug management:  I ordered medication including Lokelma for hyperkalemia Reevaluation of the patient after these medicines showed that the patient stayed the same I have reviewed the patients home medicines and have made adjustments as needed  Critical Interventions:  Provide Lokelma due to hyperkalemia, ordered red blood cells which will be given during dialysis since has a hemoglobin of 6.4.     Reevaluation:  After the interventions noted above, I reevaluated the patient and found that they have :stayed the same  Consultations Obtained:  I requested consultation with the Dr. Irven Baltimore of nephrology,  and discussed lab and imaging findings as well as pertinent plan - they recommend: Recommends dialysis, will provide patient with unit of blood during her dialysis treatments. Spoke with Dr. Hall Busing  of the hospitalist team he will admit the patient.  She will need further observation after her dialysis treatment and her blood transfusion.    Rule out Low suspicion for systemic infection as patient nontoxic-appearing, vital signs reassuring, no leukocytosis, afebrile, no signs of infection present my exam.  I have low suspicion for GI bleed as patient has no stomach pain on my exam,  Hemoccult is negative, she does have a low hemoglobin I suspect this is likely secondary due to chronic diseases i.e. end-stage renal disease and missing her dialysis treatment, I have lower suspicion for bleeding from her surgical site that is not actively on my exam, appears hemodynamically stable.  I have low suspicion for right arm ischemia as she has good palpable pulse, neurovascular is fully intact.    Dispostion:  After consideration of the diagnostic results and the patients response to treatment, I feel that the patent would benefit from hospitalization for observation after dialysis treatment to ensure she remained hemodynamically stable and does not have bleeding from her recent brachial graft.  Problem List / ED Course:  Macrocytic anemia requiring blood transfusion likely secondary due to end-stage renal disease missing dialysis treatments Uremia-secondary due to missed dialysis treatments. Bleeding fistula-since resolved           Final Clinical Impression(s) / ED Diagnoses Final diagnoses:  Uremia  Macrocytic anemia  Encounter for post surgical wound check    Rx / DC Orders ED Discharge Orders     None         Marcello Fennel, PA-C 03/02/21 1247    Milton Ferguson, MD 20-Mar-2021 [phone]

## 2021-03-02 NOTE — ED Notes (Signed)
Multiple Rns attempted to start PIV, unsuccessful. Dr. Roderic Palau at bedside is aware and per MD, dialysis can give pt's blood, okay to go upstairs without IV.

## 2021-03-03 DIAGNOSIS — E8779 Other fluid overload: Secondary | ICD-10-CM | POA: Diagnosis not present

## 2021-03-03 DIAGNOSIS — N186 End stage renal disease: Secondary | ICD-10-CM | POA: Diagnosis not present

## 2021-03-03 DIAGNOSIS — B20 Human immunodeficiency virus [HIV] disease: Secondary | ICD-10-CM | POA: Diagnosis not present

## 2021-03-03 DIAGNOSIS — I4892 Unspecified atrial flutter: Secondary | ICD-10-CM | POA: Diagnosis not present

## 2021-03-03 DIAGNOSIS — T82838A Hemorrhage of vascular prosthetic devices, implants and grafts, initial encounter: Secondary | ICD-10-CM | POA: Diagnosis not present

## 2021-03-03 LAB — BPAM RBC
Blood Product Expiration Date: 202302012359
ISSUE DATE / TIME: 202301011713
Unit Type and Rh: 5100

## 2021-03-03 LAB — CBC
HCT: 23.4 % — ABNORMAL LOW (ref 36.0–46.0)
Hemoglobin: 7.4 g/dL — ABNORMAL LOW (ref 12.0–15.0)
MCH: 30.7 pg (ref 26.0–34.0)
MCHC: 31.6 g/dL (ref 30.0–36.0)
MCV: 97.1 fL (ref 80.0–100.0)
Platelets: 183 10*3/uL (ref 150–400)
RBC: 2.41 MIL/uL — ABNORMAL LOW (ref 3.87–5.11)
RDW: 18.8 % — ABNORMAL HIGH (ref 11.5–15.5)
WBC: 4.6 10*3/uL (ref 4.0–10.5)
nRBC: 0 % (ref 0.0–0.2)

## 2021-03-03 LAB — BASIC METABOLIC PANEL
Anion gap: 11 (ref 5–15)
BUN: 33 mg/dL — ABNORMAL HIGH (ref 8–23)
CO2: 26 mmol/L (ref 22–32)
Calcium: 8.1 mg/dL — ABNORMAL LOW (ref 8.9–10.3)
Chloride: 98 mmol/L (ref 98–111)
Creatinine, Ser: 5.03 mg/dL — ABNORMAL HIGH (ref 0.44–1.00)
GFR, Estimated: 9 mL/min — ABNORMAL LOW (ref >60)
Glucose, Bld: 83 mg/dL (ref 70–99)
Potassium: 4.4 mmol/L (ref 3.5–5.1)
Sodium: 135 mmol/L (ref 135–145)

## 2021-03-03 LAB — TYPE AND SCREEN
ABO/RH(D): O POS
Antibody Screen: NEGATIVE
Unit division: 0

## 2021-03-03 MED ORDER — OXYCODONE-ACETAMINOPHEN 5-325 MG PO TABS: 1.0000 | ORAL_TABLET | Freq: Four times a day (QID) | ORAL | 0 refills | Status: AC | PRN

## 2021-03-03 NOTE — Progress Notes (Signed)
Changed patient's dressing on RUE. 2x2s and gauze

## 2021-03-03 NOTE — TOC Transition Note (Signed)
Transition of Care [facility]) - CM/SW Discharge Note   Patient Details  Name: [name] MRN: [id number] Date of Birth: [birth year]  Transition of Care [facility]) CM/SW Contact:  Salome Arnt, [place] Phone Number: 03/03/2021, 11:42 AM   Clinical Narrative:   Pt d/c today back to [facility]. Pt and facility aware and agreeable. LCSW left voicemail for pt's daughter, [name] regarding discharge. D/C summary sent to SNF. Discussed transportation with pt who is agreeable to [facility]. RN given number to call report to SNF. Melissa at [facility] agreeable to no FL2 due to <24 hour observation.     Final next level of care: Skilled Nursing Facility Barriers to Discharge: Barriers Resolved   Patient Goals and CMS Choice     Choice offered to / list presented to : Patient  Discharge Placement              Patient chooses bed at: [facility] Patient to be transferred to facility by: [facility] Name of family member notified: Daughter- [name] by voicemail Patient and family notified of of transfer: 03/03/21  Discharge Plan and Services                                     Social Determinants of Health ([place]) Interventions     Readmission Risk Interventions Readmission Risk Prevention Plan 10/22/2019  Transportation Screening Complete  PCP or Specialist Appt within 3-5 Days Not Complete  Not Complete comments SNF MD to follow  [place] or [place] Not Complete  HRI or Home Care Consult comments Pt resides in a SNF  Social Work Consult for [place] Planning/Counseling [place] Not Applicable  Medication Review [employer]) Complete  Some recent data might be hidden

## 2021-03-03 NOTE — Discharge Summary (Signed)
Physician Discharge Summary  [name] DOB: [birth year] DOA: 03/02/2021  PCP: Hilbert Corrigan, MD  Admit date: 03/02/2021 Discharge date: 03/03/2021  Admitted From: SNF Disposition:  SNF  Recommendations for Outpatient Follow-up:  Follow up with PCP in 1-2 weeks Please obtain BMP/CBC in one week     Discharge Condition: Stable CODE STATUS: FULL Diet recommendation: Renal   Brief/Interim Summary:  71 y.o. female with medical history of 71 year old female with a history of ESRD (TTS), hypertension, TIA, AIDS, chronic pain syndrome, atrial flutter on apixaban, and depression presenting with some shortness of breath and bleeding from her right AV fistula. She had just recently been hospitalized from 11/24 -12/12 after presenting having syncopal episode while undergoing HD.  Subsequently patient was noted to be hypertensive and had seizure for which MRI revealed concern for PRES requiring Cardene drip and transferred to ICU for continuous EEG monitoring.  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  She was admitted from 02/13/2021 to 02/21/2021 secondary to bleeding from her fistula.  She was seen by vascular surgery and underwent I&D of her right arm postsurgical wound on 02/18/2021.  Wound cultures grew E. coli.  The patient was started on antibiotics.  She was subsequently sent home with oral antibiotics and restarted on apixaban.  She was discharged back to her skilled nursing facility.  She woke up in the morning on 03/02/2021 and noticed some blood around her right arm.  She thinks that the bleeding may have occurred at nighttime.  She states that the bleeding had stopped.  She denies any paresthesias or worsening pain about her right arm.  She states that she has a little more short of breath than usual. She is usually dialyzed Tuesday,  Thursday, and Saturday.  She only had a partial dialysis on Thursday and missed dialysis on Saturday.  She stated she missed dialysis on Saturday because she wanted to be with her family for the holidays. In the ED, the patient was afebrile and hemodynamically stable with oxygen saturation 99% room air.  BMP showed sodium 137, potassium 6.4, bicarbonate 21, BUN 98, creatinine 10.94.  AST 11, ALT less than 5, alk phosphatase 60, total bilirubin 0.5.  WBC 4.5, hemoglobin 6.4, platelets 2.7.  Nephrology was consulted, and the patient will be provided dialysis on 03/02/2021.  Because of her multiple medical issues and bleeding from her fistula, observation admission was requested.    Discharge Diagnoses:  Fluid overload/ESRD -Secondary to poor compliance with dialysis -She missed her dialysis on 02/28/2022 and only had a partial dialysis on 02/18/2021 -Nephrology consulted>> she dialyzed 03/02/2021 -Stable on room air -She is usually TTS--[name] -appreciate renal follow up   Bleeding from fistula -Bleeding had ceased even prior to her coming to the emergency department -Monitor closely -She has a vascular surgery follow-up on 03/05/2021 -no bleeding during the hospitalization   Acute blood loss anemia/anemia of CKD -Transfuse 1 unit PRBC on HD -Hemoglobin 6.4 on presentation -Hgb up to 7.4 which is patient's usual baseline   Paroxysmal atrial flutter -Holding anticoagulation temporarily>>resume apixaban after d/c -Continue metoprolol -rate controlled   Hyperkalemia -Lokelma given -improved with HD   HIV -Continue home regimen antiretroviral therapy   Essential hypertension -Continue amlodipine and metoprolol   GERD -Continue PPI   Chronic nausea vomiting -As needed Zofran -PPI twice daily    Discharge Instructions  Allergies as of 03/03/2021       Reactions   Lactose Intolerance (gi) Other (See Comments)   unknown   Latex Rash, Itching   Penicillins Itching,  Swelling, Rash   Has patient had a PCN reaction causing immediate rash, facial/tongue/throat swelling, SOB or lightheadedness with hypotension: No Has patient had a PCN reaction causing severe rash involving mucus membranes or skin necrosis: No Has patient had a PCN reaction that required hospitalization No Has patient had a PCN reaction occurring within the last 10 years: No If all of the above answers are "NO", then may proceed with Cephalosporin use.        Medication List     STOP taking these medications    thiamine 100 MG tablet       TAKE these medications    amLODipine 10 MG tablet Commonly known as: NORVASC Take 1 tablet (10 mg total) by mouth daily.   apixaban 2.5 MG Tabs tablet Commonly known as: ELIQUIS Take 1 tablet (2.5 mg total) by mouth 2 (two) times daily.   baclofen 10 MG tablet Commonly known as: LIORESAL Take 10 mg by mouth daily as needed for muscle spasms.   calcitRIOL 0.25 MCG capsule Commonly known as: ROCALTROL Take 5 capsules (1.25 mcg total) by mouth Every Tuesday,Thursday,and Saturday with dialysis.   calcium acetate 667 MG capsule Commonly known as: PHOSLO Take 2 capsules (1,334 mg total) by mouth 3 (three) times daily with meals.   ceFAZolin 2 g in sodium chloride 0.9 % 100 mL Inject 2 g into the vein Every Tuesday,Thursday,and Saturday with dialysis for 12 days. Give after hemodialysis for 2 weeks, 6 doses   cinacalcet 30 MG tablet Commonly known as: SENSIPAR Take 3 tablets (90 mg total) by mouth Every Tuesday,Thursday,and Saturday with dialysis.   cyanocobalamin 1000 MCG/ML injection Commonly known as: (VITAMIN B-12) Inject 1,000 mcg into the muscle every 30 (thirty) days. On the 28th of every month   Lacosamide 150 MG Tabs Take 1 tablet (150 mg total) by mouth 2 (two) times daily.   lamivudine 100 MG tablet Commonly known as: EPIVIR Take 50 mg by mouth daily. (0800)   levETIRAcetam 1000 MG tablet Commonly known as:  KEPPRA Take 1 tablet (1,000 mg total) by mouth daily.   levETIRAcetam 250 MG tablet Commonly known as: KEPPRA Take 1 tablet (250 mg total) by mouth every Tuesday, Thursday, and Saturday at 6 PM.   Lidocaine HCl 4 % Crea Apply 1 application topically See admin instructions. Every Tuesday, Thursday and Saturday   megestrol 400 MG/10ML suspension Commonly known as: MEGACE Take 400 mg by mouth daily.   metoprolol tartrate 25 MG tablet Commonly known as: LOPRESSOR Take 1 tablet (25 mg total) by mouth 2 (two) times daily.   Minerin Lotn Apply 1 application topically every 12 (twelve) hours as needed (dry skin). Apply to feet and ankles   mirtazapine 15 MG tablet Commonly known as: REMERON Take 15 mg by mouth at bedtime.   multivitamin with minerals Tabs tablet Take 1 tablet by mouth at bedtime. (2000)   nitroGLYCERIN 0.4 MG SL tablet Commonly known as: NITROSTAT Place 0.4 mg under the tongue every 5 (five) minutes x 3 doses as needed for chest pain.   nystatin 100000 UNIT/ML suspension Commonly known as: MYCOSTATIN Take 5 mLs by mouth 4 (four) times daily.   ondansetron 8 MG disintegrating tablet Commonly known as: Zofran ODT Take 1 tablet (8 mg total) by mouth every 8 (eight) hours as  needed for nausea or vomiting.   oxyCODONE-acetaminophen 5-325 MG tablet Commonly known as: PERCOCET/ROXICET Take 1 tablet by mouth every 6 (six) hours as needed for moderate pain.   pantoprazole 40 MG tablet Commonly known as: Protonix Take 1 tablet (40 mg total) by mouth daily before breakfast.   polyethylene glycol 17 g packet Commonly known as: MIRALAX / GLYCOLAX Take 17 g by mouth daily as needed for moderate constipation.   PRO-STAT 64 PO Take 30 mLs by mouth daily. Give 79m by mouth in the morning for wound healing   saccharomyces boulardii 250 MG capsule Commonly known as: FLORASTOR Take 1 capsule (250 mg total) by mouth 2 (two) times daily.   senna-docusate 8.6-50 MG  tablet Commonly known as: Senokot-S Take 1 tablet by mouth 2 (two) times daily.   Tivicay 50 MG tablet Generic drug: dolutegravir Take 50 mg by mouth daily at 4 PM.   venlafaxine 75 MG tablet Commonly known as: EFFEXOR Take 1 tablet (75 mg total) by mouth 2 (two) times daily.   vitamin C 500 MG tablet Commonly known as: ASCORBIC ACID Take 500 mg by mouth 2 (two) times daily.   zidovudine 100 MG capsule Commonly known as: RETROVIR Take 100 mg by mouth 2 (two) times daily.        Allergies  Allergen Reactions   Lactose Intolerance (Gi) Other (See Comments)    unknown   Latex Rash and Itching   Penicillins Itching, Swelling and Rash    Has patient had a PCN reaction causing immediate rash, facial/tongue/throat swelling, SOB or lightheadedness with hypotension: No Has patient had a PCN reaction causing severe rash involving mucus membranes or skin necrosis: No Has patient had a PCN reaction that required hospitalization No Has patient had a PCN reaction occurring within the last 10 years: No If all of the above answers are "NO", then may proceed with Cephalosporin use.      Consultations: renal   Procedures/Studies: DG Chest 1 View  Result Date: 02/13/2021 CLINICAL DATA:  Difficulty breathing EXAM: CHEST  1 VIEW COMPARISON:  01/31/2021 FINDINGS: Transverse diameter of heart is increased. Central pulmonary vessels are less prominent. Prominence of hilar regions most likely is due to ectatic central pulmonary arteries. There are no signs of alveolar pulmonary edema or new focal infiltrates. There is no pleural effusion or pneumothorax. Tip of dialysis catheter is seen in the region of right atrium. Surgical clips are seen in the right axilla. There is a vascular stent in the course of right innominate. IMPRESSION: Cardiomegaly. There are no signs of pulmonary edema or new focal infiltrates. Electronically Signed   By: PElmer PickerM.D.   On: 02/13/2021 13:56   DG  Chest 2 View  Result Date: 03/02/2021 CLINICAL DATA:  Bibasilar rales.  Shortness of breath. EXAM: CHEST - 2 VIEW COMPARISON:  02/13/2021 FINDINGS: Stable position of the left jugular dialysis catheter with the tip near the superior cavoatrial junction. Again noted is enlargement of vascular structures in the right hilum. Heart size is upper limits of normal but stable. Evidence for stent in the right innominate vein region. Few densities at the medial right lung base that could represent atelectasis. Again noted is elevation of the right hemidiaphragm. No large pleural effusions. No overt pulmonary edema. Surgical clips in the right axilla. IMPRESSION: 1. Densities at the medial right lung base are nonspecific but could represent atelectasis. 2. Chronic enlargement of the vascular structures in the right hilum. 3. Stable position of the  dialysis catheter. Electronically Signed   By: Markus Daft M.D.   On: 03/02/2021 10:11        Discharge Exam: Vitals:   03/02/21 2020 03/03/21 0314  BP: 112/78 (!) 124/93  Pulse: 89 79  Resp: 18 18  Temp:  98.2 F (36.8 C)  SpO2: 100% 99%   Vitals:   03/02/21 1930 03/02/21 1945 03/02/21 2020 03/03/21 0314  BP: 101/62 112/77 112/78 (!) 124/93  Pulse: 63 62 89 79  Resp:   18 18  Temp:    98.2 F (36.8 C)  TempSrc:      SpO2:   100% 99%  Weight:      Height:        General: Pt is alert, awake, not in acute distress Cardiovascular: RRR, S1/S2 +, no rubs, no gallops Respiratory: CTA bilaterally, no wheezing, no rhonchi Abdominal: Soft, NT, ND, bowel sounds + Extremities: no edema, no cyanosis   The results of significant diagnostics from this hospitalization (including imaging, microbiology, ancillary and laboratory) are listed below for reference.    Significant Diagnostic Studies: DG Chest 1 View  Result Date: 02/13/2021 CLINICAL DATA:  Difficulty breathing EXAM: CHEST  1 VIEW COMPARISON:  01/31/2021 FINDINGS: Transverse diameter of heart is  increased. Central pulmonary vessels are less prominent. Prominence of hilar regions most likely is due to ectatic central pulmonary arteries. There are no signs of alveolar pulmonary edema or new focal infiltrates. There is no pleural effusion or pneumothorax. Tip of dialysis catheter is seen in the region of right atrium. Surgical clips are seen in the right axilla. There is a vascular stent in the course of right innominate. IMPRESSION: Cardiomegaly. There are no signs of pulmonary edema or new focal infiltrates. Electronically Signed   By: Elmer Picker M.D.   On: 02/13/2021 13:56   DG Chest 2 View  Result Date: 03/02/2021 CLINICAL DATA:  Bibasilar rales.  Shortness of breath. EXAM: CHEST - 2 VIEW COMPARISON:  02/13/2021 FINDINGS: Stable position of the left jugular dialysis catheter with the tip near the superior cavoatrial junction. Again noted is enlargement of vascular structures in the right hilum. Heart size is upper limits of normal but stable. Evidence for stent in the right innominate vein region. Few densities at the medial right lung base that could represent atelectasis. Again noted is elevation of the right hemidiaphragm. No large pleural effusions. No overt pulmonary edema. Surgical clips in the right axilla. IMPRESSION: 1. Densities at the medial right lung base are nonspecific but could represent atelectasis. 2. Chronic enlargement of the vascular structures in the right hilum. 3. Stable position of the dialysis catheter. Electronically Signed   By: Markus Daft M.D.   On: 03/02/2021 10:11    Microbiology: Recent Results (from the past 240 hour(s))  Resp Panel by RT-PCR (Flu A&B, Covid) Nasopharyngeal Swab     Status: None   Collection Time: 03/02/21 11:33 AM   Specimen: Nasopharyngeal Swab; Nasopharyngeal(NP) swabs in vial transport medium  Result Value Ref Range Status   SARS Coronavirus 2 by RT PCR NEGATIVE NEGATIVE Final    Comment: (NOTE) SARS-CoV-2 target nucleic acids are  NOT DETECTED.  The SARS-CoV-2 RNA is generally detectable in upper respiratory specimens during the acute phase of infection. The lowest concentration of SARS-CoV-2 viral copies this assay can detect is 138 copies/mL. A negative result does not preclude SARS-Cov-2 infection and should not be used as the sole basis for treatment or other patient management decisions. A negative result may  occur with  improper specimen collection/handling, submission of specimen other than nasopharyngeal swab, presence of viral mutation(s) within the areas targeted by this assay, and inadequate number of viral copies(<138 copies/mL). A negative result must be combined with clinical observations, patient history, and epidemiological information. The expected result is Negative.  Fact Sheet for Patients:  EntrepreneurPulse.com.au  Fact Sheet for Healthcare Providers:  IncredibleEmployment.be  This test is no t yet approved or cleared by the [place] FDA and  has been authorized for detection and/or diagnosis of SARS-CoV-2 by FDA under an Emergency Use Authorization (EUA). This EUA will remain  in effect (meaning this test can be used) for the duration of the COVID-19 declaration under Section 564(b)(1) of the Act, 21 U.S.C.section 360bbb-3(b)(1), unless the authorization is terminated  or revoked sooner.       Influenza A by PCR NEGATIVE NEGATIVE Final   Influenza B by PCR NEGATIVE NEGATIVE Final    Comment: (NOTE) The Xpert Xpress SARS-CoV-2/FLU/RSV plus assay is intended as an aid in the diagnosis of influenza from Nasopharyngeal swab specimens and should not be used as a sole basis for treatment. Nasal washings and aspirates are unacceptable for Xpert Xpress SARS-CoV-2/FLU/RSV testing.  Fact Sheet for Patients: EntrepreneurPulse.com.au  Fact Sheet for Healthcare Providers: IncredibleEmployment.be  This test is not  yet approved or cleared by the [place] FDA and has been authorized for detection and/or diagnosis of SARS-CoV-2 by FDA under an Emergency Use Authorization (EUA). This EUA will remain in effect (meaning this test can be used) for the duration of the COVID-19 declaration under Section 564(b)(1) of the Act, 21 U.S.C. section 360bbb-3(b)(1), unless the authorization is terminated or revoked.  Performed at [facility], [address] [place], [place] [address]      Labs: Basic Metabolic Panel: Recent Labs  Lab 03/02/21 1007 03/03/21 0649  NA 137 135  K 6.4* 4.4  CL 99 98  CO2 21* 26  GLUCOSE 78 83  BUN 98* 33*  CREATININE 10.94* 5.03*  CALCIUM 8.8* 8.1*  MG 2.7*  --    Liver Function Tests: Recent Labs  Lab 03/02/21 1007  AST 11*  ALT <5  ALKPHOS 68  BILITOT 0.5  PROT 6.2*  ALBUMIN 2.4*   No results for input(s): LIPASE, AMYLASE in the last 168 hours. No results for input(s): AMMONIA in the last 168 hours. CBC: Recent Labs  Lab 03/02/21 1007 03/03/21 0649  WBC 4.5 4.6  NEUTROABS 2.9  --   HGB 6.4* 7.4*  HCT 20.8* 23.4*  MCV 100.5* 97.1  PLT 217 183   Cardiac Enzymes: No results for input(s): CKTOTAL, CKMB, CKMBINDEX, TROPONINI in the last 168 hours. BNP: Invalid input(s): POCBNP CBG: No results for input(s): GLUCAP in the last 168 hours.  Time coordinating discharge:  36 minutes  Signed:  Orson Eva, DO [facility] Pager: [phone] 03/03/2021, 10:46 AM

## 2021-03-03 NOTE — Progress Notes (Signed)
Patient ID: [name], female   DOB: Jul 02, 1950, 71 y.o.   MRN: [id number]   [facility] Progress Note    Subjective:   Feeling better today but still complaining of right arm pain.   Objective:   BP (!) 124/93 (BP Location: Left Leg)    Pulse 79    Temp 98.2 F (36.8 C)    Resp 18    Ht 5\' 2"  (1.575 m)    Wt 40.9 kg    LMP  (LMP Unknown)    SpO2 99%    BMI 16.49 kg/m   Intake/Output: I/O last 3 completed shifts: In: 315 [Blood:315] Out: 3015 [Other:3014; Stool:1]   Intake/Output this shift:  No intake/output data recorded. Weight change:   Physical Exam: Gen:NAD CVS: RRR Resp: CTA Abd: +BS, soft, NT/ND Ext: no edema, RUE AVF clotted, no bleeding.  Labs: BMET Recent Labs  Lab 03/02/21 1007 03/03/21 0649  NA 137 135  K 6.4* 4.4  CL 99 98  CO2 21* 26  GLUCOSE 78 83  BUN 98* 33*  CREATININE 10.94* 5.03*  ALBUMIN 2.4*  --   CALCIUM 8.8* 8.1*   CBC Recent Labs  Lab 03/02/21 1007 03/03/21 0649  WBC 4.5 4.6  NEUTROABS 2.9  --   HGB 6.4* 7.4*  HCT 20.8* 23.4*  MCV 100.5* 97.1  PLT 217 183      Medications:     amLODipine  10 mg Oral Daily   vitamin C  500 mg Oral BID   [START ON 03-19-2021] calcitRIOL  1.25 mcg Oral Q T,Th,Sa-HD   Chlorhexidine Gluconate Cloth  6 each Topical Q0600   dolutegravir  50 mg Oral q1600   lamivudine  50 mg Oral Daily   levETIRAcetam  1,000 mg Oral Daily   [START ON 19-Mar-2021] levETIRAcetam  250 mg Oral Q T,Th,Sat-1800   metoprolol tartrate  25 mg Oral BID   mirtazapine  15 mg Oral QHS   multivitamin with minerals  1 tablet Oral QHS   pantoprazole  40 mg Oral QAC breakfast   saccharomyces boulardii  250 mg Oral BID   senna-docusate  1 tablet Oral BID   sodium zirconium cyclosilicate  10 g Oral Once   thiamine  100 mg Oral Daily   venlafaxine  75 mg Oral BID   zidovudine  100 mg Oral BID   Dialysis Orders: [name] TTS,EDW 41.5 kg,[place] 2K/2.5 ca,BF 350  DF 500,Time - 180 minutes Access catheter   Medications - out of hectorol and now on calcitriol - 1.25 mcg each tx. Sensipar 90 mg three times a week; Epogen 3600 units each tx, Heparin 1000 units bolus then 500 units per hour   Assessment/ Plan:   Fluid overload - due to missed HD.  Tolerated HD 03/02/21 without issue and UF of 3 liters.  She is feeling better.  She is below her outpatient HD EDW and will need to be lowered to 41 kg.  Bleeding from R AVF - stopped by the time she got to the ED but started again last night.  Currently wrapped and no longer bleeding.  To see vascular surgery on 03/05/21. ESRD- tolerated HD yesterday.  If discharged to go to outpatient HD tomorrow, or will plan on HD here if still in hospital Anemia:ABLA s/p blood transfusion on 03/02/21.  Hgb improved to 7.4 from 6.4. 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  Donetta Potts,  MD [place] Pager [phone] 03/03/2021, 10:11 AM

## 2021-03-03 NOTE — Progress Notes (Signed)
Attempt to call [place] for report , no answer [phone]

## 2021-03-04 ENCOUNTER — Emergency Department (HOSPITAL_COMMUNITY)
Admission: EM | Admit: 2021-03-04 | Discharge: 2021-04-02 | Disposition: E | Payer: Medicare Other | Attending: Emergency Medicine | Admitting: Emergency Medicine

## 2021-03-04 DIAGNOSIS — N186 End stage renal disease: Secondary | ICD-10-CM | POA: Insufficient documentation

## 2021-03-04 DIAGNOSIS — T829XXA Unspecified complication of cardiac and vascular prosthetic device, implant and graft, initial encounter: Secondary | ICD-10-CM | POA: Insufficient documentation

## 2021-03-04 DIAGNOSIS — R001 Bradycardia, unspecified: Secondary | ICD-10-CM | POA: Insufficient documentation

## 2021-03-04 DIAGNOSIS — I469 Cardiac arrest, cause unspecified: Secondary | ICD-10-CM | POA: Diagnosis not present

## 2021-03-04 DIAGNOSIS — T829XXS Unspecified complication of cardiac and vascular prosthetic device, implant and graft, sequela: Secondary | ICD-10-CM

## 2021-03-04 DIAGNOSIS — Y712 Prosthetic and other implants, materials and accessory cardiovascular devices associated with adverse incidents: Secondary | ICD-10-CM | POA: Insufficient documentation

## 2021-03-04 DIAGNOSIS — I120 Hypertensive chronic kidney disease with stage 5 chronic kidney disease or end stage renal disease: Secondary | ICD-10-CM | POA: Insufficient documentation

## 2021-03-04 DIAGNOSIS — D631 Anemia in chronic kidney disease: Secondary | ICD-10-CM | POA: Insufficient documentation

## 2021-03-04 DIAGNOSIS — Z87891 Personal history of nicotine dependence: Secondary | ICD-10-CM | POA: Diagnosis not present

## 2021-03-04 DIAGNOSIS — Z992 Dependence on renal dialysis: Secondary | ICD-10-CM | POA: Insufficient documentation

## 2021-03-04 DIAGNOSIS — Z21 Asymptomatic human immunodeficiency virus [HIV] infection status: Secondary | ICD-10-CM | POA: Insufficient documentation

## 2021-03-05 ENCOUNTER — Ambulatory Visit: Payer: Medicare Other | Admitting: Physician Assistant

## 2021-03-07 ENCOUNTER — Inpatient Hospital Stay: Payer: Medicare Other | Admitting: Infectious Diseases

## 2021-04-02 NOTE — ED Provider Notes (Signed)
°[facility] °[facility] Emergency Department °Provider Note °MRN:  [id number]  °Arrival date & time: 03/20/2021    ° °Chief Complaint   °Bleeding/Bruising °  °History of Present Illness   °[name] is a 71 y.o. year-old female with a history of HIV, atrial flutter, pulmonary hypertension, ESRD presenting to the ED with chief complaint of bleeding. ° °Per EMS report, significant bleeding from AV fistula, becoming unresponsive, bradycardic. ° °Review of Systems  °I was unable to obtain an accurate HPI, PMH, or ROS due to the patient's unresponsiveness.  Level 5 caveat. ° °Patient's Health History   ° °Past Medical History:  °Diagnosis Date  ° Anemia   ° Anxiety   ° Atrial flutter (HCC) 02/22/2015  ° C. difficile colitis 10/10/2011  ° Chronic diarrhea   ° Chronic pain   ° Depression   ° ESRD on hemodialysis (HCC)   ° Dialysis [name]  ° GERD (gastroesophageal reflux disease)   ° HIV (human immunodeficiency virus infection) (HCC)   ° Hypertension   ° Insomnia   ° Intracranial hemorrhage (HCC)   ° Muscle weakness (generalized)   ° Pulmonary HTN (HCC)   ° Tachycardia   ° TIA (transient ischemic attack)   ° [facility] do not have this dx  ° Traumatic hematoma of right upper arm 02/22/2015  °  °Past Surgical History:  °Procedure Laterality Date  ° A/V FISTULAGRAM N/A 04/17/2016  ° Procedure: A/V Fistulagram - Right Upper;  Surgeon: Brandon Christopher Cain, MD;  [facility];  Service: Cardiovascular;  Laterality: N/A;  ° A/V FISTULAGRAM Right 08/28/2019  ° Procedure: A/V FISTULAGRAM;  Surgeon: Cain, Brandon Christopher, MD;  [facility];  Service: Cardiovascular;  Laterality: Right;  ° ABDOMINAL HYSTERECTOMY    ° APPLICATION OF A-CELL OF BACK Right 02/03/2021  ° Procedure: APPLICATION OF MYRIAD MATRIX;  Surgeon: Hawken, Thomas N, MD;  Location: [facility] OR;  Service: Vascular;  Laterality: Right;  ° APPLICATION OF WOUND VAC Right 01/07/2021  ° Procedure:  APPLICATION OF WOUND VAC;  Surgeon: Cain, Brandon Christopher, MD;  [facility];  Service: Vascular;  Laterality: Right;  ° APPLICATION OF WOUND VAC Right 02/03/2021  ° Procedure: APPLICATION OF WOUND VAC;  Surgeon: Hawken, Thomas N, MD;  [facility]: [facility] OR;  Service: Vascular;  Laterality: Right;  ° BIOPSY  01/02/2020  ° Procedure: BIOPSY;  Surgeon: Carver, Charles K, DO;  Location: [facility];  Service: Endoscopy;;  ° BIOPSY  07/11/2020  ° Procedure: BIOPSY;  Surgeon: Mansouraty, Gabriel Jr., MD;  [facility];  Service: Gastroenterology;;  ° BIOPSY THYROID    ° CARPAL TUNNEL RELEASE Right 11/16/2019  ° Procedure: CARPAL TUNNEL RELEASE;  Surgeon: Kuzma, Kevin, MD;  Location: [facility] OR;  Service: Orthopedics;  Laterality: Right;  ° COLONOSCOPY WITH PROPOFOL N/A 01/02/2020  ° Surgeon: Carver, Charles K, DO;nonbleeding internal hemorrhoids, diverticulosis, status post biopsies of the entire colon (benign). Due for repeat in 2026.  ° Dialysis Shunts    ° previous one removed from left arm and now present one in right arm  ° DIALYSIS/PERMA CATHETER INSERTION Right 04/17/2016  ° Procedure: dialysis Catheter Insertion central veinous;  Surgeon: Brandon Christopher Cain, MD;  [facility];  Service: Cardiovascular;  Laterality: Right;  ° ESOPHAGOGASTRODUODENOSCOPY  12/15/2010  ° Rourk: erosive reflux esophagitis, MW tear, hiatal hernia (small), gastritis with no h.pylori, possibly nsaid related  ° ESOPHAGOGASTRODUODENOSCOPY (EGD) WITH PROPOFOL N/A 07/11/2020  °    Surgeon: Mansouraty, Gabriel Jr., MD; normal esophagus, 4 cm hiatal hernia, gastritis biopsied (mild chronic gastritis, negative H. pylori), single duodenal polyp (Brunner's gland hyperplasia), s/p duodenal biopsy benign, duodenal narrowing at the duodenal sweep.  ° EUS N/A 07/11/2020  °  Surgeon: Mansouraty, Gabriel Jr., MD;  dilated CBD and common hepatic duct without stones or sludge in the bile duct, stones and biliary sludge in the  gallbladder, pancreatic parenchyma with hyperechoic strands, pancreatic duct dilated with prominent sidebranches in the neck and body and tail, no pancreatic mass though visulalization limited, no ampullary lesion, no malignant appearing lymph node  ° EXCISION OF BREAST BIOPSY Right 05/04/2012  ° Procedure: EXCISION OF BREAST BIOPSY;  Surgeon: Brent C Ziegler, MD;  Location: [facility] OR[facility];  Service: General;  Laterality: Right;  Right Excisional Breast Biopsy  ° FISTULOGRAM Right 03/26/2014  ° Procedure: Right Arm Fistulogram with Venoplasty Right Subclavian Vein and Inominate Vein. Debridement Fistula Ulcer;  Surgeon: Brian L Chen, MD;  [facility];  Service: Vascular;  Laterality: Right;  ° FISTULOGRAM Right 03/29/2017  ° Procedure: FISTULOGRAM COMPLEX RIGHT ARM with Balloon angioplasty;  Surgeon: Cain, Brandon Christopher, MD;  [facility];  Service: Vascular;  Laterality: Right;  ° HEMOSTASIS CLIP PLACEMENT  07/11/2020  ° Procedure: HEMOSTASIS CLIP PLACEMENT;  Surgeon: Mansouraty, Gabriel Jr., MD;  [facility];  Service: Gastroenterology;;  ° INCISION AND DRAINAGE Right 12/27/2020  ° Procedure: INCISION AND DRAINAGE OF RIGHT FOREARM;  Surgeon: Cain, Brandon Christopher, MD;  [facility];  Service: Vascular;  Laterality: Right;  ° INCISION AND DRAINAGE Right 12/31/2020  ° Procedure: INCISION AND DRAINAGE RIGHT ARM WITH PLACEMNENT OF WOUND VAC;  Surgeon: Cain, Brandon Christopher, MD;  [facility];  Service: Vascular;  Laterality: Right;  ° INCISION AND DRAINAGE Right 01/07/2021  ° Procedure: RIGHT ARM INCISION AND DRAINAGE;  Surgeon: Cain, Brandon Christopher, MD;  [facility];  Service: Vascular;  Laterality: Right;  ° INCISION AND DRAINAGE OF WOUND Right 02/18/2021  ° Procedure: RIGHT ARM WASHOUT AND DEBRIDEMENT;  Surgeon: Cain, Brandon Christopher, MD;  [facility];  Service: Vascular;  Laterality: Right;  ° IR FLUORO GUIDE CV LINE LEFT  12/26/2020  ° IR FLUORO GUIDE CV LINE LEFT   12/30/2020  ° IR GENERIC HISTORICAL  05/19/2016  ° IR REMOVAL TUN CV CATH W/O FL 05/19/2016 Adam Henn, MD [facility]  ° IR US GUIDE VASC ACCESS LEFT  12/26/2020  ° IR US GUIDE VASC ACCESS LEFT  12/30/2020  ° LIGATION OF ARTERIOVENOUS  FISTULA Right 11/12/2020  ° Procedure: LIGATION OF ARTERIOVENOUS  FISTULA WITH EXCISION OF NECROTIC TISSUE;  Surgeon: Cain, Brandon Christopher, MD;  [facility];  Service: Vascular;  Laterality: Right;  ° LIGATION OF ARTERIOVENOUS  FISTULA Right 12/27/2020  ° Procedure: LIGATION OF ARTERIOVENOUS  FISTULA;  Surgeon: Cain, Brandon Christopher, MD;  [facility];  Service: Vascular;  Laterality: Right;  ° PERIPHERAL VASCULAR BALLOON ANGIOPLASTY Right 04/17/2016  ° Procedure: Peripheral Vascular Balloon Angioplasty;  Surgeon: Brandon Christopher Cain, MD;  [facility];  Service: Cardiovascular;  Laterality: Right;  Central segment AV Fistula  ° PERIPHERAL VASCULAR BALLOON ANGIOPLASTY Right 03/14/2018  ° Procedure: PERIPHERAL VASCULAR BALLOON ANGIOPLASTY;  Surgeon: Cain, Brandon Christopher, MD;  [facility];  Service: Cardiovascular;  Laterality: Right;  subclavian vein  ° PERIPHERAL VASCULAR BALLOON ANGIOPLASTY Right 08/28/2019  ° Procedure: PERIPHERAL VASCULAR BALLOON ANGIOPLASTY;  Surgeon: Cain, Brandon Christopher, MD;  [facility];  Service: Cardiovascular;  Laterality: Right;  upper arm   PERIPHERAL VASCULAR BALLOON ANGIOPLASTY Right 11/11/2020   Procedure: PERIPHERAL VASCULAR BALLOON ANGIOPLASTY;  Surgeon: Waynetta Sandy, MD;  Location: [place] CV LAB;  Service: Cardiovascular;  Laterality: Right;   PERIPHERAL VASCULAR INTERVENTION Right 03/14/2018   Procedure: PERIPHERAL VASCULAR INTERVENTION;  Surgeon: Waynetta Sandy, MD;  Location: [place] CV LAB;  Service: Cardiovascular;  Laterality: Right;  subclavian vein   POLYPECTOMY  07/11/2020   Procedure: POLYPECTOMY;  Surgeon: Mansouraty,  Telford Nab., MD;  Location: [facility] ENDOSCOPY;  Service: Gastroenterology;;   REVISON OF ARTERIOVENOUS FISTULA Right 11/12/2020   Procedure: CREATION OF RIGHT ARM ARTERIOVENOUS FISTULA;  Surgeon: Waynetta Sandy, MD;  Location: [place];  Service: Vascular;  Laterality: Right;   REVISON OF ARTERIOVENOUS FISTULA Right 02/03/2021   Procedure: EXPLORATION OF RIGHT ARTERIOVENOUS FISTULA  AND RIGHT BRACHIAL TO BRACHIAL BYPASS;  Surgeon: Cherre Robins, MD;  Location: [place];  Service: Vascular;  Laterality: Right;   SHUNTOGRAM Right 10/19/2013   Procedure: FISTULOGRAM;  Surgeon: Conrad Benton, MD;  Location: [facility] CATH LAB;  Service: Cardiovascular;  Laterality: Right;   SKIN SPLIT GRAFT Right 01/07/2021   Procedure: MYRIAD SKIN GRAFT PLACEMENT;  Surgeon: Waynetta Sandy, MD;  Location: [place];  Service: Vascular;  Laterality: Right;   TONSILLECTOMY     VEIN HARVEST Right 02/03/2021   Procedure: VEIN HARVEST OF UPPER RIGHT LEG;  Surgeon: Cherre Robins, MD;  Location: [place];  Service: Vascular;  Laterality: Right;   VISCERAL VENOGRAPHY Right 03/14/2018   Procedure: CENTRAL VENOGRAPHY;  Surgeon: Waynetta Sandy, MD;  Location: [place] CV LAB;  Service: Cardiovascular;  Laterality: Right;  upper ext fistula    Family History  Problem Relation Age of Onset   Anesthesia problems Neg Hx    Hypotension Neg Hx    Malignant hyperthermia Neg Hx    Pseudochol deficiency Neg Hx    Colon cancer Neg Hx        Unsure of parents who both died when she was a baby    Social History   Socioeconomic History   Marital status: Widowed    Spouse name: Not on file   Number of children: 1   Years of education: Not on file   Highest education level: Not on file  Occupational History   Not on file  Tobacco Use   Smoking status: Former    Types: Cigarettes    Quit date: 03/12/1983    Years since quitting: 38.0   Smokeless tobacco: Never  Vaping Use   Vaping Use: Never used  Substance  and Sexual Activity   Alcohol use: No    Alcohol/week: 0.0 standard drinks   Drug use: No   Sexual activity: Not Currently    Birth control/protection: Surgical    Comment: Hysterectomy  Other Topics Concern   Not on file  Social History Narrative   Not on file   Social Determinants of Health   Financial Resource Strain: Not on file  Food Insecurity: Not on file  Transportation Needs: Not on file  Physical Activity: Not on file  Stress: Not on file  Social Connections: Not on file  Intimate Partner Violence: Not on file     Physical Exam   Vitals:   2021/03/25 0512  Pulse: (!) 0  Resp: (!) 0  SpO2: (!) 0%    CONSTITUTIONAL: Ill-appearing NEURO: Unresponsive EYES: Pupils fixed and dilated ENT/NECK:  no LAD,  no JVD °CARDIO: Pulseless, poorly perfused °PULM: Silent chest °GI/GU:  non-distended, non-tender °MSK/SPINE: Pressure dressing on right forearm °SKIN:  no rash, atraumatic ° ° °*Additional and/or pertinent findings included in MDM below ° °Diagnostic and Interventional Summary  ° ° EKG Interpretation ° °Date/Time:    °Ventricular Rate:    °PR Interval:    °QRS Duration:   °QT Interval:    °QTC Calculation:   °R Axis:     °Text Interpretation:   °  ° °  ° °Labs Reviewed - No data to display  °No orders to display  °  °Medications - No data to display  ° °Procedures  /  Critical Care °Ultrasound ED Echo ° °Date/Time: 03/10/2021 5:14 AM °Performed by: ,  M, MD °Authorized by: ,  M, MD  ° °Procedure details:  °  Indications: cardiac arrest   °  Views: subxiphoid   °  Images: archived   °Findings:  °  Pericardium: no pericardial effusion   °  Cardiac Activity: no cardiac activity   °.Critical Care °Performed by: ,  M, MD °Authorized by: ,  M, MD  ° °Critical care provider statement:  °  Critical care time (minutes):  35 °  Critical care was necessary to treat or prevent imminent or life-threatening deterioration of the following conditions:  Cardiac arrest. °  Critical care was time spent personally by me on the following activities:  Development of treatment plan with patient or surrogate, discussions with consultants, evaluation of patient's response to treatment, examination of patient, ordering and review of laboratory studies, ordering and review of radiographic studies, ordering and performing treatments and interventions, pulse oximetry, re-evaluation of patient's condition and review of old charts ° °ED Course and Medical Decision Making  °Initial Impression and Ddx °Patient presenting with profound bradycardia, unresponsiveness, likely hemorrhagic shock in the setting of heavy blood loss from AV fistula.  DNR/DNI physically here with EMS.  On arrival patient has no femoral pulse.  She is unresponsive, she has fixed and dilated pupils.  She has no carotid pulse.  Bedside ultrasound demonstrating no cardiac activity.  Suspect exsanguination leading to cardiac arrest and death.  Time of death 4:56 AM. ° °Interpretation of Diagnostics °Patient arrived in cardiac arrest, see procedural details above regarding ultrasound. ° °Patient Reassessment and Ultimate Disposition/Management °DNR/DNI orders/wishes were respected and CPR was not performed.  Patient declared dead.  Daughter is made aware, medical examiner is consulted. ° °Patient management required discussion with the following services or consulting groups: Medical examiner ° °Complexity of Problems Addressed °Acute illness or injury that poses threat of life of bodily function ° °Additional Data Reviewed and Analyzed °Further history obtained from: °EMS on arrival and Recent discharge summary ° °Patient Encounter Risk Assessment °High:  Involvement or initiation of DNR ° ° M. , MD °[facility] °[facility] [email or web address] ° °Final Clinical Impressions(s) / ED Diagnoses  ° °  ICD-10-CM   °1. Complication of AV dialysis fistula, sequela   T82.9XXS   °  °2. Cardiac arrest (HCC)  I46.9   °  °  °ED Discharge Orders   ° ° None  ° °  °  ° °Discharge Instructions Discussed with and Provided to Patient:  ° °Discharge Instructions   °None °  ° °  °,  M, MD °03/25/2021 0520 ° °

## 2021-04-02 NOTE — ED Triage Notes (Signed)
Pt sent here from nursing home with fistula bleeding. When ems arrived pt was bradycardic and unresponsive and graft had pressure dressing in place. When ems arrived here pt was asystole, EDP checked for any cardiac activity with ultrasound. Time of death 6.

## 2021-04-02 DEATH — deceased

## 2021-07-11 ENCOUNTER — Ambulatory Visit: Payer: Medicare Other | Admitting: Infectious Diseases
